# Patient Record
Sex: Male | Born: 1960 | Race: White | Hispanic: No | Marital: Single | Smoking: Never smoker
Health system: Southern US, Community
[De-identification: ages and names within clinical notes are randomized; demographics above are authoritative.]

## PROBLEM LIST (undated history)

## (undated) DIAGNOSIS — J449 Chronic obstructive pulmonary disease, unspecified: Secondary | ICD-10-CM

## (undated) DIAGNOSIS — I10 Essential (primary) hypertension: Secondary | ICD-10-CM

## (undated) DIAGNOSIS — M1990 Unspecified osteoarthritis, unspecified site: Secondary | ICD-10-CM

## (undated) DIAGNOSIS — G8929 Other chronic pain: Secondary | ICD-10-CM

## (undated) DIAGNOSIS — Z86718 Personal history of other venous thrombosis and embolism: Secondary | ICD-10-CM

## (undated) DIAGNOSIS — I4819 Other persistent atrial fibrillation: Secondary | ICD-10-CM

## (undated) DIAGNOSIS — I428 Other cardiomyopathies: Secondary | ICD-10-CM

## (undated) DIAGNOSIS — Z86711 Personal history of pulmonary embolism: Secondary | ICD-10-CM

## (undated) DIAGNOSIS — K219 Gastro-esophageal reflux disease without esophagitis: Secondary | ICD-10-CM

## (undated) DIAGNOSIS — J45909 Unspecified asthma, uncomplicated: Secondary | ICD-10-CM

## (undated) DIAGNOSIS — I5020 Unspecified systolic (congestive) heart failure: Secondary | ICD-10-CM

## (undated) DIAGNOSIS — G939 Disorder of brain, unspecified: Secondary | ICD-10-CM

## (undated) DIAGNOSIS — F419 Anxiety disorder, unspecified: Secondary | ICD-10-CM

## (undated) DIAGNOSIS — G4730 Sleep apnea, unspecified: Secondary | ICD-10-CM

## (undated) DIAGNOSIS — I219 Acute myocardial infarction, unspecified: Secondary | ICD-10-CM

## (undated) DIAGNOSIS — M542 Cervicalgia: Secondary | ICD-10-CM

## (undated) DIAGNOSIS — T7840XA Allergy, unspecified, initial encounter: Secondary | ICD-10-CM

## (undated) DIAGNOSIS — F329 Major depressive disorder, single episode, unspecified: Secondary | ICD-10-CM

## (undated) DIAGNOSIS — D689 Coagulation defect, unspecified: Secondary | ICD-10-CM

## (undated) DIAGNOSIS — F32A Depression, unspecified: Secondary | ICD-10-CM

## (undated) HISTORY — PX: HERNIA REPAIR: SHX51

## (undated) HISTORY — PX: LEG SURGERY: SHX1003

## (undated) HISTORY — DX: Anxiety disorder, unspecified: F41.9

## (undated) HISTORY — DX: Unspecified osteoarthritis, unspecified site: M19.90

## (undated) HISTORY — DX: Disorder of brain, unspecified: G93.9

## (undated) HISTORY — DX: Gastro-esophageal reflux disease without esophagitis: K21.9

## (undated) HISTORY — DX: Allergy, unspecified, initial encounter: T78.40XA

## (undated) HISTORY — DX: Coagulation defect, unspecified: D68.9

## (undated) HISTORY — PX: OTHER SURGICAL HISTORY: SHX169

---
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
## 2018-01-16 ENCOUNTER — Encounter: Payer: Self-pay | Admitting: Internal Medicine

## 2018-01-16 ENCOUNTER — Emergency Department: Payer: Medicaid - Out of State

## 2018-01-16 ENCOUNTER — Inpatient Hospital Stay
Admission: EM | Admit: 2018-01-16 | Discharge: 2018-01-19 | DRG: 291 | Disposition: A | Discharge status: Home / Self Care | Payer: Medicaid - Out of State | Attending: Internal Medicine | Admitting: Internal Medicine

## 2018-01-16 DIAGNOSIS — Z8249 Family history of ischemic heart disease and other diseases of the circulatory system: Secondary | ICD-10-CM | POA: Diagnosis not present

## 2018-01-16 DIAGNOSIS — Z6841 Body Mass Index (BMI) 40.0 and over, adult: Secondary | ICD-10-CM | POA: Diagnosis not present

## 2018-01-16 DIAGNOSIS — I4891 Unspecified atrial fibrillation: Secondary | ICD-10-CM | POA: Diagnosis not present

## 2018-01-16 DIAGNOSIS — J9600 Acute respiratory failure, unspecified whether with hypoxia or hypercapnia: Secondary | ICD-10-CM | POA: Diagnosis not present

## 2018-01-16 DIAGNOSIS — I878 Other specified disorders of veins: Secondary | ICD-10-CM | POA: Diagnosis present

## 2018-01-16 DIAGNOSIS — I248 Other forms of acute ischemic heart disease: Secondary | ICD-10-CM | POA: Diagnosis present

## 2018-01-16 DIAGNOSIS — Z79899 Other long term (current) drug therapy: Secondary | ICD-10-CM

## 2018-01-16 DIAGNOSIS — J441 Chronic obstructive pulmonary disease with (acute) exacerbation: Secondary | ICD-10-CM | POA: Diagnosis present

## 2018-01-16 DIAGNOSIS — I447 Left bundle-branch block, unspecified: Secondary | ICD-10-CM | POA: Diagnosis present

## 2018-01-16 DIAGNOSIS — J9601 Acute respiratory failure with hypoxia: Secondary | ICD-10-CM | POA: Diagnosis not present

## 2018-01-16 DIAGNOSIS — E662 Morbid (severe) obesity with alveolar hypoventilation: Secondary | ICD-10-CM | POA: Diagnosis present

## 2018-01-16 DIAGNOSIS — I5023 Acute on chronic systolic (congestive) heart failure: Secondary | ICD-10-CM | POA: Diagnosis not present

## 2018-01-16 DIAGNOSIS — I110 Hypertensive heart disease with heart failure: Secondary | ICD-10-CM | POA: Diagnosis present

## 2018-01-16 DIAGNOSIS — Z7901 Long term (current) use of anticoagulants: Secondary | ICD-10-CM

## 2018-01-16 DIAGNOSIS — I509 Heart failure, unspecified: Secondary | ICD-10-CM | POA: Diagnosis not present

## 2018-01-16 DIAGNOSIS — G4733 Obstructive sleep apnea (adult) (pediatric): Secondary | ICD-10-CM | POA: Diagnosis not present

## 2018-01-16 DIAGNOSIS — R0602 Shortness of breath: Secondary | ICD-10-CM | POA: Diagnosis not present

## 2018-01-16 DIAGNOSIS — J45901 Unspecified asthma with (acute) exacerbation: Secondary | ICD-10-CM | POA: Diagnosis not present

## 2018-01-16 HISTORY — DX: Sleep apnea, unspecified: G47.30

## 2018-01-16 HISTORY — DX: Unspecified asthma, uncomplicated: J45.909

## 2018-01-16 LAB — CBC WITH DIFFERENTIAL/PLATELET
Abs Immature Granulocytes: 0.02 10*3/uL (ref 0.00–0.07)
Basophils Absolute: 0 10*3/uL (ref 0.0–0.1)
Basophils Relative: 1 %
Eosinophils Absolute: 0 10*3/uL (ref 0.0–0.5)
Eosinophils Relative: 1 %
HCT: 36.5 % — ABNORMAL LOW (ref 39.0–52.0)
Hemoglobin: 11.1 g/dL — ABNORMAL LOW (ref 13.0–17.0)
Immature Granulocytes: 0 %
Lymphocytes Relative: 12 %
Lymphs Abs: 0.8 10*3/uL (ref 0.7–4.0)
MCH: 24.8 pg — ABNORMAL LOW (ref 26.0–34.0)
MCHC: 30.4 g/dL (ref 30.0–36.0)
MCV: 81.5 fL (ref 80.0–100.0)
MONOS PCT: 10 %
Monocytes Absolute: 0.6 10*3/uL (ref 0.1–1.0)
Neutro Abs: 4.8 10*3/uL (ref 1.7–7.7)
Neutrophils Relative %: 76 %
Platelets: 226 10*3/uL (ref 150–400)
RBC: 4.48 MIL/uL (ref 4.22–5.81)
RDW: 19.1 % — ABNORMAL HIGH (ref 11.5–15.5)
WBC: 6.3 10*3/uL (ref 4.0–10.5)
nRBC: 0 % (ref 0.0–0.2)

## 2018-01-16 LAB — COMPREHENSIVE METABOLIC PANEL
ALT: 18 U/L (ref 0–44)
AST: 30 U/L (ref 15–41)
Albumin: 3.3 g/dL — ABNORMAL LOW (ref 3.5–5.0)
Alkaline Phosphatase: 60 U/L (ref 38–126)
Anion gap: 10 (ref 5–15)
BUN: 10 mg/dL (ref 6–20)
CO2: 27 mmol/L (ref 22–32)
Calcium: 8.7 mg/dL — ABNORMAL LOW (ref 8.9–10.3)
Chloride: 101 mmol/L (ref 98–111)
Creatinine, Ser: 0.64 mg/dL (ref 0.61–1.24)
GFR calc Af Amer: >60 mL/min (ref >60)
GFR calc non Af Amer: >60 mL/min (ref >60)
Glucose, Bld: 133 mg/dL — ABNORMAL HIGH (ref 70–99)
Potassium: 3.5 mmol/L (ref 3.5–5.1)
Sodium: 138 mmol/L (ref 135–145)
Total Bilirubin: 0.9 mg/dL (ref 0.3–1.2)
Total Protein: 7.3 g/dL (ref 6.5–8.1)

## 2018-01-16 LAB — GLUCOSE, CAPILLARY: Glucose-Capillary: 144 mg/dL — ABNORMAL HIGH (ref 70–99)

## 2018-01-16 LAB — TROPONIN I
Troponin I: 0.04 ng/mL (ref <0.03)
Troponin I: 0.04 ng/mL (ref <0.03)

## 2018-01-16 LAB — CG4 I-STAT (LACTIC ACID): Lactic Acid, Venous: 1.97 mmol/L — ABNORMAL HIGH (ref 0.5–1.9)

## 2018-01-16 LAB — BRAIN NATRIURETIC PEPTIDE: B Natriuretic Peptide: 307 pg/mL — ABNORMAL HIGH (ref 0.0–100.0)

## 2018-01-16 LAB — MRSA PCR SCREENING: MRSA by PCR: NEGATIVE

## 2018-01-16 MED ORDER — ACETAMINOPHEN 325 MG PO TABS
650.0000 mg | ORAL_TABLET | Freq: Four times a day (QID) | ORAL | Status: DC | PRN
  Administered 2018-01-18 – 2018-01-19 (×2): 650 mg via ORAL
  Filled 2018-01-16 (×2): qty 2

## 2018-01-16 MED ORDER — ACETAMINOPHEN 650 MG RE SUPP: 650.0000 mg | Freq: Four times a day (QID) | RECTAL | Status: DC | PRN

## 2018-01-16 MED ORDER — IPRATROPIUM-ALBUTEROL 0.5-2.5 (3) MG/3ML IN SOLN
3.0000 mL | Freq: Once | RESPIRATORY_TRACT | Status: AC
  Administered 2018-01-16: 3 mL via RESPIRATORY_TRACT
  Filled 2018-01-16: qty 3

## 2018-01-16 MED ORDER — LEVALBUTEROL HCL 0.63 MG/3ML IN NEBU
0.6300 mg | INHALATION_SOLUTION | Freq: Four times a day (QID) | RESPIRATORY_TRACT | Status: DC
  Administered 2018-01-16 – 2018-01-18 (×5): 0.63 mg via RESPIRATORY_TRACT
  Filled 2018-01-16 (×10): qty 3

## 2018-01-16 MED ORDER — RIVAROXABAN 20 MG PO TABS
20.0000 mg | ORAL_TABLET | Freq: Every day | ORAL | Status: DC
  Administered 2018-01-16 – 2018-01-18 (×3): 20 mg via ORAL
  Filled 2018-01-16 (×5): qty 1

## 2018-01-16 MED ORDER — MORPHINE SULFATE (PF) 2 MG/ML IV SOLN
1.0000 mg | INTRAVENOUS | Status: DC | PRN
  Administered 2018-01-16 – 2018-01-18 (×3): 2 mg via INTRAVENOUS
  Filled 2018-01-16 (×3): qty 1

## 2018-01-16 MED ORDER — ASPIRIN EC 81 MG PO TBEC
81.0000 mg | DELAYED_RELEASE_TABLET | Freq: Every day | ORAL | Status: DC
  Administered 2018-01-17 – 2018-01-19 (×3): 81 mg via ORAL
  Filled 2018-01-16 (×3): qty 1

## 2018-01-16 MED ORDER — TRAZODONE HCL 50 MG PO TABS
150.0000 mg | ORAL_TABLET | Freq: Every evening | ORAL | Status: DC | PRN
  Administered 2018-01-16 – 2018-01-18 (×3): 150 mg via ORAL
  Filled 2018-01-16 (×3): qty 1

## 2018-01-16 MED ORDER — BUMETANIDE 1 MG PO TABS
2.0000 mg | ORAL_TABLET | Freq: Two times a day (BID) | ORAL | Status: DC
  Administered 2018-01-16 – 2018-01-17 (×2): 2 mg via ORAL
  Filled 2018-01-16 (×3): qty 2

## 2018-01-16 MED ORDER — ONDANSETRON HCL 4 MG/2ML IJ SOLN: 4.0000 mg | Freq: Four times a day (QID) | INTRAMUSCULAR | Status: DC | PRN

## 2018-01-16 MED ORDER — SODIUM CHLORIDE 0.9% FLUSH
3.0000 mL | Freq: Two times a day (BID) | INTRAVENOUS | Status: DC
  Administered 2018-01-16 – 2018-01-19 (×6): 3 mL via INTRAVENOUS

## 2018-01-16 MED ORDER — SODIUM CHLORIDE 0.9 % IV SOLN: 250.0000 mL | INTRAVENOUS | Status: DC | PRN

## 2018-01-16 MED ORDER — FUROSEMIDE 10 MG/ML IJ SOLN: 40.0000 mg | Freq: Two times a day (BID) | INTRAMUSCULAR | Status: DC

## 2018-01-16 MED ORDER — INFLUENZA VAC SPLIT QUAD 0.5 ML IM SUSY: 0.5000 mL | PREFILLED_SYRINGE | INTRAMUSCULAR | Status: DC

## 2018-01-16 MED ORDER — BUDESONIDE 0.25 MG/2ML IN SUSP
0.2500 mg | Freq: Two times a day (BID) | RESPIRATORY_TRACT | Status: DC
  Administered 2018-01-17 – 2018-01-19 (×5): 0.25 mg via RESPIRATORY_TRACT
  Filled 2018-01-16 (×6): qty 2

## 2018-01-16 MED ORDER — FUROSEMIDE 10 MG/ML IJ SOLN
40.0000 mg | Freq: Once | INTRAMUSCULAR | Status: AC
  Administered 2018-01-16: 40 mg via INTRAVENOUS
  Filled 2018-01-16: qty 4

## 2018-01-16 MED ORDER — PNEUMOCOCCAL VAC POLYVALENT 25 MCG/0.5ML IJ INJ: 0.5000 mL | INJECTION | INTRAMUSCULAR | Status: DC

## 2018-01-16 MED ORDER — NITROGLYCERIN 0.4 MG SL SUBL: 0.4000 mg | SUBLINGUAL_TABLET | SUBLINGUAL | Status: DC | PRN

## 2018-01-16 MED ORDER — SODIUM CHLORIDE 0.9% FLUSH
3.0000 mL | INTRAVENOUS | Status: DC | PRN
  Administered 2018-01-18: 3 mL via INTRAVENOUS
  Filled 2018-01-16: qty 3

## 2018-01-16 MED ORDER — ENOXAPARIN SODIUM 40 MG/0.4ML ~~LOC~~ SOLN: 40.0000 mg | SUBCUTANEOUS | Status: DC

## 2018-01-16 MED ORDER — METHYLPREDNISOLONE SODIUM SUCC 125 MG IJ SOLR
125.0000 mg | Freq: Once | INTRAMUSCULAR | Status: AC
  Administered 2018-01-16: 125 mg via INTRAVENOUS
  Filled 2018-01-16: qty 2

## 2018-01-16 MED ORDER — FUROSEMIDE 10 MG/ML IJ SOLN
40.0000 mg | Freq: Two times a day (BID) | INTRAMUSCULAR | Status: DC
  Administered 2018-01-17 – 2018-01-19 (×6): 40 mg via INTRAVENOUS
  Filled 2018-01-16 (×7): qty 4

## 2018-01-16 MED ORDER — LISINOPRIL 2.5 MG PO TABS
2.5000 mg | ORAL_TABLET | Freq: Every day | ORAL | Status: DC
  Administered 2018-01-16 – 2018-01-19 (×4): 2.5 mg via ORAL
  Filled 2018-01-16 (×4): qty 1

## 2018-01-16 MED ORDER — ONDANSETRON HCL 4 MG PO TABS: 4.0000 mg | ORAL_TABLET | Freq: Four times a day (QID) | ORAL | Status: DC | PRN

## 2018-01-16 NOTE — ED Notes (Signed)
I-STAT Lactic done on pt by this Tech. Result came back within normal range. Rn Monserrate notified of the result.

## 2018-01-16 NOTE — Progress Notes (Signed)
Anticoag Monitoring: Patient is 57yo male admitted for acute respiratory failure. Patient was taking Xarelto prior to admission and this has been continued on admission.  Will discontinue Lovenox 40mg  SQ q24h since patient will be fully anticoagulated on Xarelto.  Clovia Cuff, PharmD, BCPS 01/16/2018 2:43 PM

## 2018-01-16 NOTE — Progress Notes (Signed)
D/C Lovenox order when released as patient takes home Xarelto and will continue that inpatient per physician discussion.  Mauri Reading, PharmD Pharmacy Resident  01/16/2018 1:35 PM

## 2018-01-16 NOTE — ED Triage Notes (Signed)
Pt presents today via [facility] from home for SOB. Pt was found in tri pod position. Pt has hx of CHF and afib and was 70's  [facility] placed pt on bipap. Pt presented on bi pap

## 2018-01-16 NOTE — H&P (Signed)
[employer] - [place] [facility]   PATIENT NAME: [name] [name]    MR#:  [id number]  DATE OF BIRTH:  1960-07-11  DATE OF ADMISSION:  01/16/2018  PRIMARY CARE PHYSICIAN: Patient, No Pcp Per   REQUESTING/REFERRING PHYSICIAN: Governor Rooks MD  CHIEF COMPLAINT:   Chief Complaint  Patient presents with  . Shortness of Breath    HISTORY OF PRESENT ILLNESS: [name] [name]  is a 57 y.o. male with a known history of congestive heart failure, asthma, sleep apnea who states that he recently moved from [place] and not has not been able to get some of his medications.  Who is presenting with shortness of breath since this morning.  He states that it started all of a sudden.  Arrived in the ED patient had to be placed on BiPAP which she continues to be on.  Patient complains of wheezing also swelling of his lower extremity.  He does not have any cough or fevers.  Complains of occasional chest pains.  PAST MEDICAL HISTORY:   Past Medical History:  Diagnosis Date  . A-fib (HCC)   . Asthma   . CHF (congestive heart failure) (HCC)   . Sleep apnea     PAST SURGICAL HISTORY:  Past Surgical History:  Procedure Laterality Date  . hearnia repair    . LEG SURGERY      SOCIAL HISTORY:  Social History   Tobacco Use  . Smoking status: Never Smoker  . Smokeless tobacco: Never Used  Substance Use Topics  . Alcohol use: Never    Frequency: Never    FAMILY HISTORY:  Family History  Problem Relation Age of Onset  . Heart failure Mother     DRUG ALLERGIES: Not on File  REVIEW OF SYSTEMS:   CONSTITUTIONAL: No fever, fatigue or weakness.  EYES: No blurred or double vision.  EARS, NOSE, AND THROAT: No tinnitus or ear pain.  RESPIRATORY: No cough, positive shortness of breath, wheezing or hemoptysis.  CARDIOVASCULAR: No chest pain, orthopnea, positive edema.  GASTROINTESTINAL: No nausea, vomiting, diarrhea or abdominal pain.  GENITOURINARY: No dysuria, hematuria.   ENDOCRINE: No polyuria, nocturia,  HEMATOLOGY: No anemia, easy bruising or bleeding SKIN: No rash or lesion. MUSCULOSKELETAL: No joint pain or arthritis.   NEUROLOGIC: No tingling, numbness, weakness.  PSYCHIATRY: No anxiety or depression.   MEDICATIONS AT HOME:  Prior to Admission medications   Medication Sig Start Date End Date Taking? Authorizing Provider  bumetanide (BUMEX) 2 MG tablet Take 2 mg by mouth 2 (two) times daily.   Yes [provider]  lisinopril (PRINIVIL,ZESTRIL) 2.5 MG tablet Take 2.5 mg by mouth daily.   Yes [provider]  nitroGLYCERIN (NITROSTAT) 0.4 MG SL tablet Place 0.4 mg under the tongue every 5 (five) minutes as needed for chest pain.   Yes [provider]  rivaroxaban (XARELTO) 20 MG TABS tablet Take 20 mg by mouth daily.   Yes [provider]      PHYSICAL EXAMINATION:   VITAL SIGNS: Blood pressure (!) 155/118, pulse 74, temperature 97.9 F (36.6 C), temperature source Axillary, resp. rate (!) 49, height 5\' 8"  (1.727 m), weight (!) 167.8 kg, SpO2 98 %.  GENERAL:  57 y.o.-year-old patient lying in the bed with no acute distress.  EYES: Pupils equal, round, reactive to light and accommodation. No scleral icterus. Extraocular muscles intact.  HEENT: Head atraumatic, normocephalic. Oropharynx and nasopharynx clear.  NECK:  Supple, no jugular venous distention. No thyroid enlargement, no tenderness.  LUNGS: Bilateral crackles at the bases 2+ edema .  CARDIOVASCULAR: S1, S2 normal. No murmurs, rubs, or gallops.  ABDOMEN: Soft, nontender, nondistended. Bowel sounds present. No organomegaly or mass.  EXTREMITIES: 2+ pedal edema, cyanosis, or clubbing.  NEUROLOGIC: Cranial nerves II through XII are intact. Muscle strength 5/5 in all extremities. Sensation intact. Gait not checked.  PSYCHIATRIC: The patient is alert and oriented x 3.  SKIN: No obvious rash, lesion, or ulcer.   LABORATORY PANEL:   CBC Recent Labs  Lab  01/16/18 1109  WBC 6.3  HGB 11.1*  HCT 36.5*  PLT 226  MCV 81.5  MCH 24.8*  MCHC 30.4  RDW 19.1*  LYMPHSABS 0.8  MONOABS 0.6  EOSABS 0.0  BASOSABS 0.0   ------------------------------------------------------------------------------------------------------------------  Chemistries  Recent Labs  Lab 01/16/18 1109  NA 138  K 3.5  CL 101  CO2 27  GLUCOSE 133*  BUN 10  CREATININE 0.64  CALCIUM 8.7*  AST 30  ALT 18  ALKPHOS 60  BILITOT 0.9   ------------------------------------------------------------------------------------------------------------------ estimated creatinine clearance is 155.9 mL/min (by C-G formula based on SCr of 0.64 mg/dL). ------------------------------------------------------------------------------------------------------------------ No results for input(s): TSH, T4TOTAL, T3FREE, THYROIDAB in the last 72 hours.  Invalid input(s): FREET3   Coagulation profile No results for input(s): INR, PROTIME in the last 168 hours. ------------------------------------------------------------------------------------------------------------------- No results for input(s): DDIMER in the last 72 hours. -------------------------------------------------------------------------------------------------------------------  Cardiac Enzymes Recent Labs  Lab 01/16/18 1109  TROPONINI 0.04*   ------------------------------------------------------------------------------------------------------------------ Invalid input(s): POCBNP  ---------------------------------------------------------------------------------------------------------------  Urinalysis No results found for: COLORURINE, APPEARANCEUR, LABSPEC, PHURINE, GLUCOSEU, HGBUR, BILIRUBINUR, KETONESUR, PROTEINUR, UROBILINOGEN, NITRITE, LEUKOCYTESUR   RADIOLOGY: Dg Chest Port 1 View  Result Date: 01/16/2018 CLINICAL DATA:  Shortness of breath. History of congestive heart failure. EXAM: PORTABLE CHEST 1 VIEW  COMPARISON:  None. FINDINGS: Cardiomegaly. Pulmonary venous hypertension. Question early interstitial edema. No visible effusion. Bony structures unremarkable. IMPRESSION: Cardiomegaly. Possible mild congestive heart failure. Question blue bloater COPD. Electronically Signed   By: Paulina Fusi M.D.   On: 01/16/2018 11:46    EKG: Orders placed or performed during the hospital encounter of 01/16/18  . ED EKG  . ED EKG  . EKG 12-Lead  . EKG 12-Lead    IMPRESSION AND PLAN: Patient is a 57 year old with history of congestive heart failure presenting with shortness of breath  1.  Acute respiratory failure suspect due to acute CHF as well as possible asthma exasperation I will treat with IV Lasix Continue BiPAP patient will be admitted to stepdown unit We will place him on nebulizer therapy as well as Pulmicort  I have notified the intensivist of admission Will obtain echocardiogram of the heart  2.  Essential hypertension continue lisinopril  3.  History of atrial fibrillation continue Xarelto  4.  Sleep apnea CPAP once off BiPAP at nighttime  5.  Morbid obesity weight loss recommended  6.  Elevated troponin due to demand ischemia follow cardiac enzymes  All the records are reviewed and case discussed with ED provider. Management plans discussed with the patient, family and they are in agreement.  CODE STATUS: Full code    TOTAL TIME TAKING CARE OF THIS PATIENT: 55 minutes care time spent.    Auburn Bilberry M.D on 01/16/2018 at 5:29 PM  Between 7am to 6pm - Pager - [phone]  After 6pm go to [email or web address] - password [email or web address] [facility]  [employer] Office  [phone]  CC: Primary care physician; Patient, No Pcp Per

## 2018-01-16 NOTE — Progress Notes (Signed)
Pt was transported to CCU from ED while on the bipap. 

## 2018-01-16 NOTE — ED Provider Notes (Signed)
[facility] Emergency Department Provider Note ____________________________________________   I have reviewed the triage vital signs and the triage nursing note.  HISTORY  Chief Complaint Shortness of Breath   Historian Level 5 Caveat History Limited by illness EMS provides history  HPI [name] [name] is a 57 y.o. male brought in by EMS from home for severe trouble breathing.  Reported to them that he started having shortness of breath around 2 AM, when they got to him after he called 9 1 this morning he was tripoding.  Patient was apparently around 82% on room air, and came up to 99% on CPAP     No past medical history on file.  COPD, obesity, hypertension  There are no active problems to display for this patient.     Prior to Admission medications   Medication Sig Start Date End Date Taking? Authorizing Provider  bumetanide (BUMEX) 2 MG tablet Take 2 mg by mouth 2 (two) times daily.   Yes [provider]  lisinopril (PRINIVIL,ZESTRIL) 2.5 MG tablet Take 2.5 mg by mouth daily.   Yes [provider]  nitroGLYCERIN (NITROSTAT) 0.4 MG SL tablet Place 0.4 mg under the tongue every 5 (five) minutes as needed for chest pain.   Yes [provider]  rivaroxaban (XARELTO) 20 MG TABS tablet Take 20 mg by mouth daily.   Yes [provider]  Lisinopril, aspirin, Xarelto  Not on File  No family history on file.  Social History Social History   Tobacco Use  . Smoking status: Not on file  Substance Use Topics  . Alcohol use: Not on file  . Drug use: Not on file    Review of Systems  Constitutional: Negative for fever. Eyes: Negative for visual changes. ENT: Negative for sore throat. Cardiovascular: For some chest discomfort earlier, took one nitro, that part is better. Respiratory: Positive for shortness of breath. Gastrointestinal: Negative for abdominal pain, vomiting and diarrhea. Genitourinary: Negative for  dysuria. Musculoskeletal: Negative for back pain. Skin: Negative for rash. Neurological: Negative for headache.  ____________________________________________   PHYSICAL EXAM:  VITAL SIGNS: ED Triage Vitals  Enc Vitals Group     BP      Pulse      Resp      Temp      Temp src      SpO2      Weight      Height      Head Circumference      Peak Flow      Pain Score      Pain Loc      Pain Edu?      Excl. in GC?      Constitutional: Alert and oriented, critical shortness of breath.[name]  HEENT      Head: Normocephalic and atraumatic.      Eyes: Conjunctivae are normal. Pupils equal and round.       Ears:         Nose: No congestion/rhinnorhea.      Mouth/Throat: Mucous membranes are moist.      Neck: No stridor. Cardiovascular/Chest: Normal rate, regular rhythm.  No murmurs, rubs, or gallops. Respiratory: Tachypnea with suprasternal retractions.  On CPAP by EMS.  Tight breath sounds throughout, rales throughout. Gastrointestinal: Soft. No distention, no guarding, no rebound. Nontender.  Morbid obesity Genitourinary/rectal:Deferred Musculoskeletal: Nontender with normal range of motion in all extremities. No joint effusions.  No lower extremity tenderness.  Plus pitting edema bilateral lower extremities Neurologic:  Normal speech and language. No gross or focal neurologic deficits are appreciated. Skin:  Skin is warm, dry and intact. No rash noted. Psychiatric: Mood and affect are normal. Speech and behavior are normal. Patient exhibits appropriate insight and judgment.   ____________________________________________  LABS (pertinent positives/negatives) I, Governor Rooks, MD the attending physician have reviewed the labs noted below.  Labs Reviewed  BRAIN NATRIURETIC PEPTIDE - Abnormal; Notable for the following components:      Result Value   B Natriuretic Peptide 307.0 (*)    All other components within normal limits  COMPREHENSIVE METABOLIC PANEL - Abnormal; Notable  for the following components:   Glucose, Bld 133 (*)    Calcium 8.7 (*)    Albumin 3.3 (*)    All other components within normal limits  TROPONIN I - Abnormal; Notable for the following components:   Troponin I 0.04 (*)    All other components within normal limits  CBC WITH DIFFERENTIAL/PLATELET - Abnormal; Notable for the following components:   Hemoglobin 11.1 (*)    HCT 36.5 (*)    MCH 24.8 (*)    RDW 19.1 (*)    All other components within normal limits  CG4 I-STAT (LACTIC ACID) - Abnormal; Notable for the following components:   Lactic Acid, Venous 1.97 (*)    All other components within normal limits  CULTURE, BLOOD (ROUTINE X 2)  CULTURE, BLOOD (ROUTINE X 2)    ____________________________________________    EKG I, Governor Rooks, MD, the attending physician have personally viewed and interpreted all ECGs.  94 bpm.  Atrial fibrillation.  Left bundle branch block. ____________________________________________  RADIOLOGY   Chest x-ray portable:  IMPRESSION: Cardiomegaly. Possible mild congestive heart failure. Question blue bloater COPD. __________________________________________  PROCEDURES  Procedure(s) performed: None  Procedures  Critical Care performed: CRITICAL CARE Performed by: Governor Rooks   Total critical care time: 30 minutes  Critical care time was exclusive of separately billable procedures and treating other patients.  Critical care was necessary to treat or prevent imminent or life-threatening deterioration.  Critical care was time spent personally by me on the following activities: development of treatment plan with patient and/or surrogate as well as nursing, discussions with consultants, evaluation of patient's response to treatment, examination of patient, obtaining history from patient or surrogate, ordering and performing treatments and interventions, ordering and review of laboratory studies, ordering and review of radiographic studies,  pulse oximetry and re-evaluation of patient's condition.    ____________________________________________  ED COURSE / ASSESSMENT AND PLAN  Pertinent labs & imaging results that were available during my care of the patient were reviewed by me and considered in my medical decision making (see chart for details).     Patient arrived on cPAP, now saturating around 99%, and switched on to our BiPAP.  Patient clinically looks like volume overload with rales and lower extremity edema.  Unclear if he does have a history of CHF.  He certainly does have a history of COPD.  Initially felt most consistent with CHF, and initiated Lasix IV dose.  Chest x-ray without focal infiltrate.  I think pneumonia is unlikely.   BNP is only in the 300s.  Chest x-ray looks more like COPD rather than edema, I did go ahead and base patient on DuoNeb's as well as Solu-Medrol.  Patient will need hospital management for COPD plus or minus CHF with hypoxia, currently still on BiPAP.    CONSULTATIONS: Hospitalist for admission.  Patient / Family / Caregiver informed of clinical  course, medical decision-making process, and agree with plan.    ___________________________________________   FINAL CLINICAL IMPRESSION(S) / ED DIAGNOSES   Final diagnoses:  COPD exacerbation (HCC)  Acute congestive heart failure, unspecified heart failure type (HCC)      ___________________________________________         Note: This dictation was prepared with Dragon dictation. Any transcriptional errors that result from this process are unintentional    Governor Rooks, MD 01/16/18 1309

## 2018-01-16 NOTE — ED Notes (Signed)
ED TO INPATIENT HANDOFF REPORT  Name/Age/Gender [name] [name] 57 y.o. male  Code Status   Home/SNF/Other Home  Chief Complaint diffculty breathing  Level of Care/Admitting Diagnosis ED Disposition    ED Disposition Condition Comment   Admit  Hospital Area: [facility] [[id number]]  Level of Care: Stepdown [14]  Diagnosis: Acute respiratory failure (HCC) [518.81.ICD-9-CM]  Admitting Physician: Auburn Bilberry [[id number]]  Attending Physician: Auburn Bilberry [[id number]]  Estimated length of stay: past midnight tomorrow  Certification:: I certify this patient will need inpatient services for at least 2 midnights  PT Class (Do Not Modify): Inpatient [101]  PT Acc Code (Do Not Modify): Private [1]       Medical History No past medical history on file.  Allergies Not on File  IV Location/Drains/Wounds Patient Lines/Drains/Airways Status   Active Line/Drains/Airways    Name:   Placement date:   Placement time:   Site:   Days:   Peripheral IV 01/16/18 Right Antecubital   01/16/18    1106    Antecubital   less than 1   Peripheral IV 01/16/18 Right Hand   01/16/18    1113    Hand   less than 1          Labs/Imaging Results for orders placed or performed during the hospital encounter of 01/16/18 (from the past 48 hour(s))  Brain natriuretic peptide     Status: Abnormal   Collection Time: 01/16/18 11:09 AM  Result Value Ref Range   B Natriuretic Peptide 307.0 (H) 0.0 - 100.0 pg/mL    Comment: Performed at [facility], [address] [place], [place] [address]  Comprehensive metabolic panel     Status: Abnormal   Collection Time: 01/16/18 11:09 AM  Result Value Ref Range   Sodium 138 135 - 145 mmol/L   Potassium 3.5 3.5 - 5.1 mmol/L   Chloride 101 98 - 111 mmol/L   CO2 27 22 - 32 mmol/L   Glucose, Bld 133 (H) 70 - 99 mg/dL   BUN 10 6 - 20 mg/dL   Creatinine, Ser 1.16 0.61 - 1.24 mg/dL   Calcium 8.7 (L) 8.9 - 10.3 mg/dL   Total Protein  7.3 6.5 - 8.1 g/dL   Albumin 3.3 (L) 3.5 - 5.0 g/dL   AST 30 15 - 41 U/L   ALT 18 0 - 44 U/L   Alkaline Phosphatase 60 38 - 126 U/L   Total Bilirubin 0.9 0.3 - 1.2 mg/dL   GFR calc non Af Amer >60 >60 mL/min   GFR calc Af Amer >60 >60 mL/min   Anion gap 10 5 - 15    Comment: Performed at [facility], [address] [place], [place] [address]  Troponin I - Once     Status: Abnormal   Collection Time: 01/16/18 11:09 AM  Result Value Ref Range   Troponin I 0.04 (HH) <0.03 ng/mL    Comment: CRITICAL RESULT CALLED TO, READ BACK BY AND VERIFIED WITH GREG MOYER @1225  01/16/18 BY FMW Performed at [facility], [address] [place], [place] [address]   CBC with Differential     Status: Abnormal   Collection Time: 01/16/18 11:09 AM  Result Value Ref Range   WBC 6.3 4.0 - 10.5 K/uL   RBC 4.48 4.22 - 5.81 MIL/uL   Hemoglobin 11.1 (L) 13.0 - 17.0 g/dL   HCT 32.9 (L) 19.1 - 66.0 %   MCV 81.5 80.0 - 100.0 fL   MCH  24.8 (L) 26.0 - 34.0 pg   MCHC 30.4 30.0 - 36.0 g/dL   RDW 16.1 (H) 09.6 - 04.5 %   Platelets 226 150 - 400 K/uL   nRBC 0.0 0.0 - 0.2 %   Neutrophils Relative % 76 %   Neutro Abs 4.8 1.7 - 7.7 K/uL   Lymphocytes Relative 12 %   Lymphs Abs 0.8 0.7 - 4.0 K/uL   Monocytes Relative 10 %   Monocytes Absolute 0.6 0.1 - 1.0 K/uL   Eosinophils Relative 1 %   Eosinophils Absolute 0.0 0.0 - 0.5 K/uL   Basophils Relative 1 %   Basophils Absolute 0.0 0.0 - 0.1 K/uL   Immature Granulocytes 0 %   Abs Immature Granulocytes 0.02 0.00 - 0.07 K/uL    Comment: Performed at [facility], [address] [place], [place] [address]  CG4 I-STAT (Lactic acid)     Status: Abnormal   Collection Time: 01/16/18 11:11 AM  Result Value Ref Range   Lactic Acid, Venous 1.97 (H) 0.5 - 1.9 mmol/L   Dg Chest Port 1 View  Result Date: 01/16/2018 CLINICAL DATA:  Shortness of breath. History of congestive heart failure. EXAM: PORTABLE CHEST 1 VIEW COMPARISON:  None.  FINDINGS: Cardiomegaly. Pulmonary venous hypertension. Question early interstitial edema. No visible effusion. Bony structures unremarkable. IMPRESSION: Cardiomegaly. Possible mild congestive heart failure. Question blue bloater COPD. Electronically Signed   By: Paulina Fusi M.D.   On: 01/16/2018 11:46    Pending Labs Unresulted Labs (From admission, onward)    Start     Ordered   01/16/18 1123  Culture, blood (routine x 2)  BLOOD CULTURE X 2,   STAT     01/16/18 1122   Signed and Held  HIV antibody (Routine Testing)  Once,   R     Signed and Held   Signed and Held  CBC  (enoxaparin (LOVENOX)    CrCl >/= 30 ml/min)  Once,   R    Comments:  Baseline for enoxaparin therapy IF NOT ALREADY DRAWN.  Notify MD if PLT < 100 K.    Signed and Held   Signed and Held  Creatinine, serum  (enoxaparin (LOVENOX)    CrCl >/= 30 ml/min)  Once,   R    Comments:  Baseline for enoxaparin therapy IF NOT ALREADY DRAWN.    Signed and Held   Signed and Held  Creatinine, serum  (enoxaparin (LOVENOX)    CrCl >/= 30 ml/min)  Weekly,   R    Comments:  while on enoxaparin therapy    Signed and Held   Signed and Held  Troponin I - Now Then Q6H  Now then every 6 hours,   R     Signed and Held   Signed and Held  CBC  Tomorrow morning,   R     Signed and Held   Signed and Held  Basic metabolic panel  Tomorrow morning,   R     Signed and Held          Vitals/Pain Today's Vitals   01/16/18 1200 01/16/18 1232 01/16/18 1245 01/16/18 1400  BP: (Abnormal) 117/93 99/89  (Abnormal) 132/99  Pulse: (Abnormal) 106 (Abnormal) 108 94 (Abnormal) 103  Resp: (Abnormal) 23     Temp:      TempSrc:      SpO2: 99% 100% 97% 100%  Weight:      Height:      PainSc:  Isolation Precautions No active isolations  Medications Medications  budesonide (PULMICORT) nebulizer solution 0.25 mg (has no administration in time range)  furosemide (LASIX) injection 40 mg (has no administration in time range)  furosemide (LASIX)  injection 40 mg (40 mg Intravenous Given 01/16/18 1132)  ipratropium-albuterol (DUONEB) 0.5-2.5 (3) MG/3ML nebulizer solution 3 mL (3 mLs Nebulization Given 01/16/18 1313)  ipratropium-albuterol (DUONEB) 0.5-2.5 (3) MG/3ML nebulizer solution 3 mL (3 mLs Nebulization Given 01/16/18 1313)  methylPREDNISolone sodium succinate (SOLU-MEDROL) 125 mg/2 mL injection 125 mg (125 mg Intravenous Given 01/16/18 1301)    Mobility walks

## 2018-01-16 NOTE — Consult Note (Signed)
Reason for Consult: Respiratory failure Referring Physician: Dr. Reyes Ivan [name] is an 57 y.o. male.  HPI: [name] [name] is a 57 year old gentleman with a past medical history remarkable for congestive heart failure, atrial fibrillation on systemic anticoagulation, hypertension, COPD, was brought in from home after he woke up at 2 AM with acute onset of shortness of breath.  When EMS arrived he was found to be hypoxemic with oxygen saturations in the 70s, was started on positive pressure and transported to the emergency department.  He was switched to BiPAP, given Lasix, albuterol, Atrovent, budesonide and Solu-Medrol.  Chest x-ray revealed cardiomegaly with vascular congestion  No past medical history on file.   No family history on file.  Social History:  has no tobacco, alcohol, and drug history on file.  Allergies: Not on File  Medications: I have reviewed the patient's current medications.  Results for orders placed or performed during the hospital encounter of 01/16/18 (from the past 48 hour(s))  Brain natriuretic peptide     Status: Abnormal   Collection Time: 01/16/18 11:09 AM  Result Value Ref Range   B Natriuretic Peptide 307.0 (H) 0.0 - 100.0 pg/mL    Comment: Performed at [facility], [address] [place], [place] [address]  Comprehensive metabolic panel     Status: Abnormal   Collection Time: 01/16/18 11:09 AM  Result Value Ref Range   Sodium 138 135 - 145 mmol/L   Potassium 3.5 3.5 - 5.1 mmol/L   Chloride 101 98 - 111 mmol/L   CO2 27 22 - 32 mmol/L   Glucose, Bld 133 (H) 70 - 99 mg/dL   BUN 10 6 - 20 mg/dL   Creatinine, Ser 6.04 0.61 - 1.24 mg/dL   Calcium 8.7 (L) 8.9 - 10.3 mg/dL   Total Protein 7.3 6.5 - 8.1 g/dL   Albumin 3.3 (L) 3.5 - 5.0 g/dL   AST 30 15 - 41 U/L   ALT 18 0 - 44 U/L   Alkaline Phosphatase 60 38 - 126 U/L   Total Bilirubin 0.9 0.3 - 1.2 mg/dL   GFR calc non Af Amer >60 >60 mL/min   GFR calc Af Amer >60 >60 mL/min   Anion  gap 10 5 - 15    Comment: Performed at [facility], [address] [place], [place] [address]  Troponin I - Once     Status: Abnormal   Collection Time: 01/16/18 11:09 AM  Result Value Ref Range   Troponin I 0.04 (HH) <0.03 ng/mL    Comment: CRITICAL RESULT CALLED TO, READ BACK BY AND VERIFIED WITH GREG MOYER @1225  01/16/18 BY FMW Performed at [facility], [address] [place], [place] [address]   CBC with Differential     Status: Abnormal   Collection Time: 01/16/18 11:09 AM  Result Value Ref Range   WBC 6.3 4.0 - 10.5 K/uL   RBC 4.48 4.22 - 5.81 MIL/uL   Hemoglobin 11.1 (L) 13.0 - 17.0 g/dL   HCT 78.2 (L) 95.6 - 21.3 %   MCV 81.5 80.0 - 100.0 fL   MCH 24.8 (L) 26.0 - 34.0 pg   MCHC 30.4 30.0 - 36.0 g/dL   RDW 08.6 (H) 57.8 - 46.9 %   Platelets 226 150 - 400 K/uL   nRBC 0.0 0.0 - 0.2 %   Neutrophils Relative % 76 %   Neutro Abs 4.8 1.7 - 7.7 K/uL   Lymphocytes Relative 12 %   Lymphs Abs 0.8  0.7 - 4.0 K/uL   Monocytes Relative 10 %   Monocytes Absolute 0.6 0.1 - 1.0 K/uL   Eosinophils Relative 1 %   Eosinophils Absolute 0.0 0.0 - 0.5 K/uL   Basophils Relative 1 %   Basophils Absolute 0.0 0.0 - 0.1 K/uL   Immature Granulocytes 0 %   Abs Immature Granulocytes 0.02 0.00 - 0.07 K/uL    Comment: Performed at [facility], [address] [place], [place] [address]  CG4 I-STAT (Lactic acid)     Status: Abnormal   Collection Time: 01/16/18 11:11 AM  Result Value Ref Range   Lactic Acid, Venous 1.97 (H) 0.5 - 1.9 mmol/L    Dg Chest Port 1 View  Result Date: 01/16/2018 CLINICAL DATA:  Shortness of breath. History of congestive heart failure. EXAM: PORTABLE CHEST 1 VIEW COMPARISON:  None. FINDINGS: Cardiomegaly. Pulmonary venous hypertension. Question early interstitial edema. No visible effusion. Bony structures unremarkable. IMPRESSION: Cardiomegaly. Possible mild congestive heart failure. Question blue bloater COPD. Electronically Signed   By:  Paulina Fusi M.D.   On: 01/16/2018 11:46    ROS   Constitutional: Negative for fever. Eyes: Negative for visual changes. ENT: Negative for sore throat. Cardiovascular: For some chest discomfort earlier, took one nitro, that part is better. Respiratory: Positive for shortness of breath. Gastrointestinal: Negative for abdominal pain, vomiting and diarrhea. Genitourinary: Negative for dysuria. Musculoskeletal: Negative for back pain. Skin: Negative for rash. Neurological: Negative for headache.  Blood pressure (!) 132/99, pulse (!) 118, temperature 98.7 F (37.1 C), temperature source Oral, resp. rate (!) 24, height 5\' 7"  (1.702 m), weight (!) 167.8 kg, SpO2 100 %.   Physical Exam   Vital signs: Please see the above listed vital signs HEENT: Trachea midline, no thyromegaly appreciated, positive accessory muscle utilization, on BiPAP Cardiovascular: Irregularly irregular rhythm with increased ventricular response Pulmonary: Rales appreciated bilaterally Abdominal: Morbid obesity, positive bowel sounds Extremities: Massive edema appreciated with chronic venous stasis Neurologic: Patient moves all extremities, no focal deficits appreciated, awake alert communicating and appropriate Cutaneous: No rashes or lesions noted  Assessment/Plan: Respiratory distress.  Patient with underlying history of CHF with chest x-ray revealing cardiomegaly with vascular congestion.  Elevated BNP, most likely reflects decompensated pulmonary edema.  Responding well to BiPAP.  History of COPD.  Agree with albuterol, Atrovent, budesonide and Solu-Medrol.  No clinical evidence of infection right now we will hold on antibiotics  Obstructively sleep apnea/obesity hypoventilation syndrome.  Patient is on home CPAP with no oxygen.  Morbid obesity suspect component of hypoventilation syndrome.  Will need to confirm with arterial blood gas  Atrial fibrillation.  Will monitor ventricular response, may need  Cardizem  Hypertension.  Table at this time will monitor closely  Minimal elevation in troponin at 0.04.  EKG reveals atrial fibrillation with left bundle branch block   [name] Kindred, [name] [name]  [name] [name] 01/16/2018, 2:24 PM

## 2018-01-17 ENCOUNTER — Inpatient Hospital Stay
Admit: 2018-01-17 | Discharge: 2018-01-17 | Disposition: A | Discharge status: Home / Self Care | Payer: Medicaid - Out of State | Attending: Internal Medicine | Admitting: Internal Medicine

## 2018-01-17 ENCOUNTER — Other Ambulatory Visit: Payer: Self-pay

## 2018-01-17 DIAGNOSIS — J441 Chronic obstructive pulmonary disease with (acute) exacerbation: Secondary | ICD-10-CM

## 2018-01-17 DIAGNOSIS — I5031 Acute diastolic (congestive) heart failure: Secondary | ICD-10-CM

## 2018-01-17 LAB — TROPONIN I
Troponin I: 0.03 ng/mL (ref <0.03)
Troponin I: 0.03 ng/mL (ref <0.03)

## 2018-01-17 LAB — BASIC METABOLIC PANEL
Anion gap: 8 (ref 5–15)
BUN: 13 mg/dL (ref 6–20)
CO2: 30 mmol/L (ref 22–32)
Calcium: 8.6 mg/dL — ABNORMAL LOW (ref 8.9–10.3)
Chloride: 99 mmol/L (ref 98–111)
Creatinine, Ser: 0.7 mg/dL (ref 0.61–1.24)
GFR calc Af Amer: >60 mL/min (ref >60)
GFR calc non Af Amer: >60 mL/min (ref >60)
Glucose, Bld: 164 mg/dL — ABNORMAL HIGH (ref 70–99)
POTASSIUM: 3.8 mmol/L (ref 3.5–5.1)
Sodium: 137 mmol/L (ref 135–145)

## 2018-01-17 LAB — CBC
HCT: 33.8 % — ABNORMAL LOW (ref 39.0–52.0)
Hemoglobin: 10.3 g/dL — ABNORMAL LOW (ref 13.0–17.0)
MCH: 24.7 pg — ABNORMAL LOW (ref 26.0–34.0)
MCHC: 30.5 g/dL (ref 30.0–36.0)
MCV: 81.1 fL (ref 80.0–100.0)
Platelets: 201 10*3/uL (ref 150–400)
RBC: 4.17 MIL/uL — ABNORMAL LOW (ref 4.22–5.81)
RDW: 18.9 % — ABNORMAL HIGH (ref 11.5–15.5)
WBC: 4.1 10*3/uL (ref 4.0–10.5)
nRBC: 0 % (ref 0.0–0.2)

## 2018-01-17 LAB — ECHOCARDIOGRAM COMPLETE
Height: 68 in
Weight: 5918.91 oz

## 2018-01-17 MED ORDER — DIGOXIN 0.25 MG/ML IJ SOLN
0.2500 mg | Freq: Every day | INTRAMUSCULAR | Status: DC
  Administered 2018-01-17 – 2018-01-19 (×3): 0.25 mg via INTRAVENOUS
  Filled 2018-01-17 (×3): qty 2

## 2018-01-17 MED ORDER — PERFLUTREN LIPID MICROSPHERE
1.0000 mL | INTRAVENOUS | Status: AC | PRN
  Administered 2018-01-17: 2 mL via INTRAVENOUS
  Filled 2018-01-17: qty 10

## 2018-01-17 MED ORDER — METOPROLOL TARTRATE 5 MG/5ML IV SOLN
5.0000 mg | Freq: Four times a day (QID) | INTRAVENOUS | Status: DC
  Administered 2018-01-17 – 2018-01-18 (×3): 5 mg via INTRAVENOUS
  Filled 2018-01-17 (×3): qty 5

## 2018-01-17 NOTE — Progress Notes (Signed)
Follow up - Critical Care Medicine Note  Patient Details:    [name] [name] is an 57 y.o. male.with a past medical history remarkable for congestive heart failure, atrial fibrillation on systemic anticoagulation, hypertension, COPD, was brought in from home after he woke up at 2 AM with acute onset of shortness of breath.  When EMS arrived he was found to be hypoxemic with oxygen saturations in the 70s, was started on positive pressure and transported to the emergency department.  He was switched to BiPAP, given Lasix, albuterol, Atrovent, budesonide and Solu-Medrol.  Chest x-ray revealed cardiomegaly with vascular congestion  Lines, Airways, Drains:    Anti-infectives:  Anti-infectives (From admission, onward)   None      Microbiology: Results for orders placed or performed during the hospital encounter of 01/16/18  Culture, blood (routine x 2)     Status: None (Preliminary result)   Collection Time: 01/16/18 11:09 AM  Result Value Ref Range Status   Specimen Description BLOOD RIGHT ANTECUBITAL  Final   Special Requests   Final    BOTTLES DRAWN AEROBIC AND ANAEROBIC Blood Culture adequate volume   Culture   Final    NO GROWTH < 24 HOURS Performed at [facility], [address], [place], [place] [address]    Report Status PENDING  Incomplete  Culture, blood (routine x 2)     Status: None (Preliminary result)   Collection Time: 01/16/18 11:09 AM  Result Value Ref Range Status   Specimen Description BLOOD RIGHT HAND  Final   Special Requests   Final    BOTTLES DRAWN AEROBIC AND ANAEROBIC Blood Culture adequate volume   Culture   Final    NO GROWTH < 24 HOURS Performed at [facility], [address], [place], [place] [address]    Report Status PENDING  Incomplete  MRSA PCR Screening     Status: None   Collection Time: 01/16/18  2:43 PM  Result Value Ref Range Status   MRSA by PCR NEGATIVE NEGATIVE Final    Comment:        The GeneXpert MRSA Assay  (FDA approved for NASAL specimens only), is one component of a comprehensive MRSA colonization surveillance program. It is not intended to diagnose MRSA infection nor to guide or monitor treatment for MRSA infections. Performed at [facility], [address] [place], [place] [address]      Studies: Dg Chest [facility] 1 View  Result Date: 01/16/2018 CLINICAL DATA:  Shortness of breath. History of congestive heart failure. EXAM: PORTABLE CHEST 1 VIEW COMPARISON:  None. FINDINGS: Cardiomegaly. Pulmonary venous hypertension. Question early interstitial edema. No visible effusion. Bony structures unremarkable. IMPRESSION: Cardiomegaly. Possible mild congestive heart failure. Question blue bloater COPD. Electronically Signed   By: Paulina Fusi M.D.   On: 01/16/2018 11:46    Consults: Treatment Team:  Pccm, Raymond Gurney, MD   Subjective:    Overnight Issues: She states he is breathing somewhat better, has had times off of BiPAP, less labored.  Still complaining of shortness of breath and chest discomfort  Objective:  Vital signs for last 24 hours: Temp:  [97.9 F (36.6 C)-98.7 F (37.1 C)] 98.2 F (36.8 C) (12/03 0100) Pulse Rate:  [46-118] 88 (12/03 0406) Resp:  [13-49] 15 (12/03 0406) BP: (99-162)/(64-118) 140/64 (12/03 0406) SpO2:  [90 %-100 %] 94 % (12/03 0818) FiO2 (%):  [36 %] 36 % (12/02 1946) Weight:  [167.8 kg] 167.8 kg (12/02 1440)  Intake/Output from previous day: 12/02 0701 -  12/03 0700 In: 240 [P.O.:240] Out: 2700 [Urine:2700]  Intake/Output this shift: No intake/output data recorded.  Vent settings for last 24 hours: FiO2 (%):  [36 %] 36 %  Physical Exam:   Vital signs:       Please see the above listed vital signs HEENT:           Trachea midline, no thyromegaly appreciated, positive accessory muscle utilization, on BiPAP Cardiovascular:           Irregularly irregular rhythm with increased ventricular response Pulmonary:      Rales appreciated  bilaterally Abdominal:      Morbid obesity, positive bowel sounds Extremities:     Massive edema appreciated with chronic venous stasis Neurologic:      Patient moves all extremities, no focal deficits appreciated, awake alert communicating and appropriate Cutaneous:      No rashes or lesions noted  Assessment/Plan:   Respiratory distress.  Patient with underlying history of CHF with chest x-ray revealing cardiomegaly with vascular congestion.  Elevated BNP, most likely reflects decompensated pulmonary edema. Responding well to BiPAP.  Patient -2.46 L diuresis  History of COPD.  Agree with albuterol, Atrovent, budesonide and Solu-Medrol.  No clinical evidence of infection right now we will hold on antibiotics  Obstructively sleep apnea/obesity hypoventilation syndrome.  Patient is on home CPAP with no oxygen.  Morbid obesity suspect component of hypoventilation syndrome.  Will need to confirm with arterial blood gas  Atrial fibrillation.    Presently controlled ventricular response  Hypertension.    She is on Lasix and lisinopril, will monitor closely may need amlodipine  Minimal elevation in troponin at 0.04.  EKG reveals atrial fibrillation with left bundle branch block  Tora Kindred, DO  Ramla Hase 01/17/2018  *Care during the described time interval was provided by me and/or other providers on the critical care team.  I have reviewed this patient's available data, including medical history, events of note, physical examination and test results as part of my evaluation.

## 2018-01-17 NOTE — Clinical Social Work Note (Signed)
CSW consulted for primary care physician and [place] medicaid. CSW has placed a Care Management consult for the RN CM. York Spaniel MSW,LcSW [phone]

## 2018-01-17 NOTE — Progress Notes (Signed)
At this time, pt HR was 104-105 so metoprolol was administered. Will re-assess pt if digoxin is still needed.

## 2018-01-17 NOTE — Consult Note (Signed)
WOC Nurse wound consult note Reason for Consult: LE wound [place]) Patient recently moved to [place] from [place]. Reports he was followed by "urgent care" and had zinc wraps on for 2 weeks until "they fell off".  Wound type: no open wound noted, however nurse does report small open area on the posterior left calf. I am unable to visualize this due to the patient in a chair and unable to lift leg high enough to see anything open  Pressure Injury POA: NA Drainage (amount, consistency, odor) serous drainage noted on my gloved hand. Periwound: 3+ pitting edema bilaterally with hemosiderin staining bilaterally. Distal pulses are weak but due to the edema not surprised Patient is alert and oriented, I would prefer to have an ABI prior to the placement of any type of compression.  ABIs are not completed in the [facility]. Therefore I will attempt to review any other records to determine safety.    He will most definitely need follow up in a wound care center of his choice for weekly Unna's boot changes. I have explained that he can not let them just fall off.  He will most likely not be homebound and [facility] will not come for Unna's boots without the presence of open wounds.   Dressing procedure/placement/frequency: Silicone foam to the weeping areas until WOC can further review patient's records. May consider Unna's boots once the I can verify adequate follow up planned.  I will reassess this patient in the am 01/18/18 [facility] MSN,RN,CWOCN, CNS, CWON-AP [phone]

## 2018-01-17 NOTE — Progress Notes (Signed)
Pt walked to bed from chair without issue at beginning of the night without issue. PT complaining of lower back and chest pain, non-radiating. Also requesting trazodone for sleep , stating he takes at home.  NP aware  New orders placed. Pt given morphine for pain, and placed on BiPAP for the night. Social Work consult placed as pt recently moved to [place] from [place], and  Stated he needs help being set up with a primary care provider and cardiologist, and is needing help with "transfer of medicaid".

## 2018-01-17 NOTE — Progress Notes (Signed)
*  PRELIMINARY RESULTS* Echocardiogram 2D Echocardiogram has been performed. Definity image enhancer was administered.  [name] [name] 01/17/2018, 2:12 PM

## 2018-01-17 NOTE — Progress Notes (Signed)
MD on call was notified of AFIB with the highest rate of 157.

## 2018-01-17 NOTE — Progress Notes (Signed)
Advance care planning  Purpose of Encounter CHF, Code status  Parties in Attendance Patient  Patients Decisional capacity Alert and oriented , able to make medical decisions  Patient has no documented healthcare power of attorney.  He does not have any family or friends that can make that decision according to him.  We discussed regarding having documentation in place for healthcare power of attorney and advanced directives.  Discussed regarding his congestive heart failure treatment plan, prognosis.  Follow-up.  CODE STATUS discussed and patient wants to be partial code with no intubation or ventilatory support.  Okay for CPR and defibrillation.  CODE STATUS change.  Time spent - 18 minutes

## 2018-01-17 NOTE — Progress Notes (Signed)
[employer] - [place] at [facility]   PATIENT NAME: [name] [name]    MR#:  [id number]  DATE OF BIRTH:  03-06-1960  SUBJECTIVE:  CHIEF COMPLAINT:   Chief Complaint  Patient presents with  . Shortness of Breath   Of BiPAP today.  Shortness of breath is improving.  Increased urine output. Moved from [place] recently.  Has no local physicians.  REVIEW OF SYSTEMS:    Review of Systems  Constitutional: Positive for malaise/fatigue. Negative for chills and fever.  HENT: Negative for sore throat.   Eyes: Negative for blurred vision, double vision and pain.  Respiratory: Positive for cough and shortness of breath. Negative for hemoptysis and wheezing.   Cardiovascular: Negative for chest pain, palpitations, orthopnea and leg swelling.  Gastrointestinal: Negative for abdominal pain, constipation, diarrhea, heartburn, nausea and vomiting.  Genitourinary: Negative for dysuria and hematuria.  Musculoskeletal: Negative for back pain and joint pain.  Skin: Negative for rash.  Neurological: Negative for sensory change, speech change, focal weakness and headaches.  Endo/Heme/Allergies: Does not bruise/bleed easily.  Psychiatric/Behavioral: Negative for depression. The patient is not nervous/anxious.     DRUG ALLERGIES:  Not on File  VITALS:  Blood pressure 94/70, pulse 89, temperature 99 F (37.2 C), temperature source Oral, resp. rate 19, height 5\' 8"  (1.727 m), weight (!) 167.8 kg, SpO2 100 %.  PHYSICAL EXAMINATION:   Physical Exam  GENERAL:  57 y.o.-year-old patient lying in the bed with no acute distress.  Morbidly obese EYES: Pupils equal, round, reactive to light and accommodation. No scleral icterus. Extraocular muscles intact.  HEENT: Head atraumatic, normocephalic. Oropharynx and nasopharynx clear.  NECK:  Supple, no jugular venous distention. No thyroid enlargement, no tenderness.  LUNGS: Normal breath sounds bilaterally, no wheezing, rales, rhonchi. No use  of accessory muscles of respiration.  CARDIOVASCULAR: S1, S2 normal. No murmurs, rubs, or gallops.  ABDOMEN: Soft, nontender, nondistended. Bowel sounds present. No organomegaly or mass.  EXTREMITIES: No cyanosis, clubbing.  Bilateral lower extremity edema NEUROLOGIC: Cranial nerves II through XII are intact. No focal Motor or sensory deficits b/l.   PSYCHIATRIC: The patient is alert and oriented x 3.  SKIN: No obvious rash, lesion, or ulcer.   LABORATORY PANEL:   CBC Recent Labs  Lab 01/17/18 0541  WBC 4.1  HGB 10.3*  HCT 33.8*  PLT 201   ------------------------------------------------------------------------------------------------------------------ Chemistries  Recent Labs  Lab 01/16/18 1109 01/17/18 0541  NA 138 137  K 3.5 3.8  CL 101 99  CO2 27 30  GLUCOSE 133* 164*  BUN 10 13  CREATININE 0.64 0.70  CALCIUM 8.7* 8.6*  AST 30  --   ALT 18  --   ALKPHOS 60  --   BILITOT 0.9  --    ------------------------------------------------------------------------------------------------------------------  Cardiac Enzymes Recent Labs  Lab 01/17/18 0541  TROPONINI 0.03*   ------------------------------------------------------------------------------------------------------------------  RADIOLOGY:  Dg Chest Port 1 View  Result Date: 01/16/2018 CLINICAL DATA:  Shortness of breath. History of congestive heart failure. EXAM: PORTABLE CHEST 1 VIEW COMPARISON:  None. FINDINGS: Cardiomegaly. Pulmonary venous hypertension. Question early interstitial edema. No visible effusion. Bony structures unremarkable. IMPRESSION: Cardiomegaly. Possible mild congestive heart failure. Question blue bloater COPD. Electronically Signed   By: Paulina Fusi M.D.   On: 01/16/2018 11:46     ASSESSMENT AND PLAN:   Patient is a 57 year old with history of congestive heart failure presenting with shortness of breath  *Acute hypoxic respiratory failure secondary to acute on chronic congestive  heart failure.  Check echocardiogram to assess LV function. Continue diuresis with IV Lasix. On lisinopril Monitor input and output.  Repeat BMP in the morning  *  Essential hypertension  continue lisinopril  * History of atrial fibrillation continue Xarelto  *  Sleep apnea CPAP once off BiPAP at nighttime  * Morbid obesity weight loss recommended  * Elevated troponin due to demand ischemia Stable on repeat troponin check  All the records are reviewed and case discussed with Care Management/Social Worker Management plans discussed with the patient, family and they are in agreement.  CODE STATUS: Partial code.  DNI  DVT Prophylaxis: SCDs  TOTAL TIME TAKING CARE OF THIS PATIENT: 30 minutes.   POSSIBLE D/C IN 1-2 DAYS, DEPENDING ON CLINICAL CONDITION.  Molinda Bailiff Emmett Arntz M.D on 01/17/2018 at 1:30 PM  Between 7am to 6pm - Pager - [phone]  After 6pm go to [email or web address] - password [email or web address]  [facility] [place] [facility]  Office  [phone]  CC: Primary care physician; Patient, No Pcp Per  Note: This dictation was prepared with Dragon dictation along with smaller phrase technology. Any transcriptional errors that result from this process are unintentional.

## 2018-01-18 LAB — BASIC METABOLIC PANEL
Anion gap: 8 (ref 5–15)
BUN: 23 mg/dL — ABNORMAL HIGH (ref 6–20)
CO2: 31 mmol/L (ref 22–32)
Calcium: 8.5 mg/dL — ABNORMAL LOW (ref 8.9–10.3)
Chloride: 99 mmol/L (ref 98–111)
Creatinine, Ser: 0.83 mg/dL (ref 0.61–1.24)
GFR calc Af Amer: >60 mL/min (ref >60)
GFR calc non Af Amer: >60 mL/min (ref >60)
Glucose, Bld: 110 mg/dL — ABNORMAL HIGH (ref 70–99)
Potassium: 3.3 mmol/L — ABNORMAL LOW (ref 3.5–5.1)
Sodium: 138 mmol/L (ref 135–145)

## 2018-01-18 LAB — HIV ANTIBODY (ROUTINE TESTING W REFLEX): HIV Screen 4th Generation wRfx: NONREACTIVE

## 2018-01-18 MED ORDER — NITROGLYCERIN 0.4 MG SL SUBL: 0.4000 mg | SUBLINGUAL_TABLET | SUBLINGUAL | 12 refills | Status: DC | PRN

## 2018-01-18 MED ORDER — METOPROLOL TARTRATE 50 MG PO TABS
50.0000 mg | ORAL_TABLET | Freq: Two times a day (BID) | ORAL | Status: DC
  Administered 2018-01-18 – 2018-01-19 (×2): 50 mg via ORAL
  Filled 2018-01-18 (×3): qty 1

## 2018-01-18 MED ORDER — POTASSIUM CHLORIDE CRYS ER 20 MEQ PO TBCR
40.0000 meq | EXTENDED_RELEASE_TABLET | Freq: Once | ORAL | Status: AC
  Administered 2018-01-18: 40 meq via ORAL
  Filled 2018-01-18: qty 2

## 2018-01-18 MED ORDER — METOPROLOL TARTRATE 50 MG PO TABS: 50.0000 mg | ORAL_TABLET | Freq: Two times a day (BID) | ORAL | 2 refills | Status: DC

## 2018-01-18 MED ORDER — FUROSEMIDE 40 MG PO TABS: 40.0000 mg | ORAL_TABLET | Freq: Two times a day (BID) | ORAL | 11 refills | Status: DC

## 2018-01-18 MED ORDER — LISINOPRIL 2.5 MG PO TABS: 2.5000 mg | ORAL_TABLET | Freq: Every day | ORAL | 2 refills | Status: DC

## 2018-01-18 MED ORDER — RIVAROXABAN 20 MG PO TABS: 20.0000 mg | ORAL_TABLET | Freq: Every day | ORAL | 2 refills | Status: DC

## 2018-01-18 MED ORDER — LEVALBUTEROL HCL 0.63 MG/3ML IN NEBU
0.6300 mg | INHALATION_SOLUTION | Freq: Two times a day (BID) | RESPIRATORY_TRACT | Status: DC
  Administered 2018-01-18 – 2018-01-19 (×2): 0.63 mg via RESPIRATORY_TRACT
  Filled 2018-01-18 (×3): qty 3

## 2018-01-18 MED ORDER — TRAZODONE HCL 150 MG PO TABS: 150.0000 mg | ORAL_TABLET | Freq: Every evening | ORAL | 2 refills | Status: DC | PRN
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
## 2018-01-18 NOTE — Progress Notes (Signed)
[facility] - [place] at [facility]   PATIENT NAME: [name] [name]    MR#:  [id number]  DATE OF BIRTH:  25-Apr-1960  SUBJECTIVE:  CHIEF COMPLAINT:   Chief Complaint  Patient presents with  . Shortness of Breath   Patient is breathing is improved however heart rate increases with activity into the 150s REVIEW OF SYSTEMS:    Review of Systems  Constitutional: Positive for malaise/fatigue. Negative for chills and fever.  HENT: Negative for sore throat.   Eyes: Negative for blurred vision, double vision and pain.  Respiratory: Positive for cough and shortness of breath. Negative for hemoptysis and wheezing.   Cardiovascular: Negative for chest pain, palpitations, orthopnea and leg swelling.  Gastrointestinal: Negative for abdominal pain, constipation, diarrhea, heartburn, nausea and vomiting.  Genitourinary: Negative for dysuria and hematuria.  Musculoskeletal: Negative for back pain and joint pain.  Skin: Negative for rash.  Neurological: Negative for sensory change, speech change, focal weakness and headaches.  Endo/Heme/Allergies: Does not bruise/bleed easily.  Psychiatric/Behavioral: Negative for depression. The patient is not nervous/anxious.     DRUG ALLERGIES:  Not on File  VITALS:  Blood pressure 119/71, pulse 85, temperature 98.2 F (36.8 C), temperature source Oral, resp. rate 20, height 5\' 8"  (1.727 m), weight (!) 167.8 kg, SpO2 99 %.  PHYSICAL EXAMINATION:   Physical Exam  GENERAL:  57 y.o.-year-old patient lying in the bed with no acute distress.  Morbidly obese EYES: Pupils equal, round, reactive to light and accommodation. No scleral icterus. Extraocular muscles intact.  HEENT: Head atraumatic, normocephalic. Oropharynx and nasopharynx clear.  NECK:  Supple, no jugular venous distention. No thyroid enlargement, no tenderness.  LUNGS: Normal breath sounds bilaterally, no wheezing, rales, rhonchi. No use of accessory muscles of respiration.   CARDIOVASCULAR: Irregularly irregular no murmurs, rubs, or gallops.  ABDOMEN: Soft, nontender, nondistended. Bowel sounds present. No organomegaly or mass.  EXTREMITIES: No cyanosis, clubbing.  Bilateral lower extremity edema NEUROLOGIC: Cranial nerves II through XII are intact. No focal Motor or sensory deficits b/l.   PSYCHIATRIC: The patient is alert and oriented x 3.  SKIN: No obvious rash, lesion, or ulcer.   LABORATORY PANEL:   CBC Recent Labs  Lab 01/17/18 0541  WBC 4.1  HGB 10.3*  HCT 33.8*  PLT 201   ------------------------------------------------------------------------------------------------------------------ Chemistries  Recent Labs  Lab 01/16/18 1109  01/18/18 0548  NA 138   < > 138  K 3.5   < > 3.3*  CL 101   < > 99  CO2 27   < > 31  GLUCOSE 133*   < > 110*  BUN 10   < > 23*  CREATININE 0.64   < > 0.83  CALCIUM 8.7*   < > 8.5*  AST 30  --   --   ALT 18  --   --   ALKPHOS 60  --   --   BILITOT 0.9  --   --    < > = values in this interval not displayed.   ------------------------------------------------------------------------------------------------------------------  Cardiac Enzymes Recent Labs  Lab 01/17/18 0541  TROPONINI 0.03*   ------------------------------------------------------------------------------------------------------------------  RADIOLOGY:  No results found.   ASSESSMENT AND PLAN:   Patient is a 57 year old with history of congestive heart failure presenting with shortness of breath  *Acute hypoxic respiratory failure secondary to acute on chronic congestive heart failure.   Patient symptoms improved continue IV Lasix diuresing well  *A. fib with RVR with activity go ahead place him on scheduled  metoprolol continue digoxin tinea Xarelto  *  Essential hypertension  continue lisinopril  * History of atrial fibrillation continue Xarelto  *  Sleep apnea CPAP once off BiPAP at nighttime  * Morbid obesity weight  loss recommended  * Elevated troponin due to demand ischemia   All the records are reviewed and case discussed with Care Management/Social Worker Management plans discussed with the patient, family and they are in agreement.  CODE STATUS: Partial code.  DNI  DVT Prophylaxis: SCDs  TOTAL TIME TAKING CARE OF THIS PATIENT: 30 minutes.   POSSIBLE D/C IN 1-2 DAYS, DEPENDING ON CLINICAL CONDITION.  Auburn Bilberry M.D on 01/18/2018 at 2:44 PM  Between 7am to 6pm - Pager - [phone]  After 6pm go to [email or web address] - password [email or web address]  [facility]  Office  [phone]  CC: Primary care physician; Patient, No Pcp Per  Note: This dictation was prepared with Dragon dictation along with smaller phrase technology. Any transcriptional errors that result from this process are unintentional.

## 2018-01-18 NOTE — Progress Notes (Signed)
Pt was ambulating and HR was elevated up to 160's Md was notified.

## 2018-01-19 LAB — BASIC METABOLIC PANEL
Anion gap: 8 (ref 5–15)
BUN: 22 mg/dL — ABNORMAL HIGH (ref 6–20)
CO2: 31 mmol/L (ref 22–32)
Calcium: 8.7 mg/dL — ABNORMAL LOW (ref 8.9–10.3)
Chloride: 97 mmol/L — ABNORMAL LOW (ref 98–111)
Creatinine, Ser: 0.75 mg/dL (ref 0.61–1.24)
GFR calc Af Amer: >60 mL/min (ref >60)
GFR calc non Af Amer: >60 mL/min (ref >60)
GLUCOSE: 114 mg/dL — AB (ref 70–99)
Potassium: 3.8 mmol/L (ref 3.5–5.1)
Sodium: 136 mmol/L (ref 135–145)

## 2018-01-19 MED ORDER — METOPROLOL TARTRATE 50 MG PO TABS: 100.0000 mg | ORAL_TABLET | Freq: Two times a day (BID) | ORAL | 2 refills | Status: DC

## 2018-01-19 MED ORDER — CIPROFLOXACIN HCL 0.3 % OP SOLN: 1.0000 [drp] | OPHTHALMIC | 0 refills | Status: DC

## 2018-01-19 MED ORDER — CIPROFLOXACIN HCL 0.3 % OP SOLN
1.0000 [drp] | OPHTHALMIC | Status: DC
  Administered 2018-01-19: 1 [drp] via OPHTHALMIC
  Filled 2018-01-19: qty 2.5

## 2018-01-19 NOTE — Care Management Note (Signed)
Case Management Note  Patient Details  Name: [name] [name] MRN: [id number] Date of Birth: January 31, 1961   Patient to discharge today. Patient states that he lives with his girlfriend.  Patient states that he moved to [place] 2 weeks ago. Patient has not initiated transferring his Medicaid to [place].  Patient has the contact information for DSS.    RNCM confirmed with Medication Management  That all prescriptions are available for patient at discharge at no cost.  Patient provided with application to Medication Management .    Patient has new patient appointment for wound care center 12/11.  Patient and MD aware.  Patient has new patient appointment at [facility] on 02/21/18 which is a sliding scale clinic.  Patient will be able to follow up with [facility] with out medicaid in place.  Patient is aware that he will have to request Medicaid to be transferred to [facility], and that he can request [place] clinic for continuity of care.   Subjective/Objective:                    Action/Plan:   Expected Discharge Date:  01/19/18               Expected Discharge Plan:  Home/Self Care  In-House Referral:     Discharge planning Services  CM Consult, Medication Assistance, Indigent Health Clinic  Post Acute Care Choice:    Choice offered to:     DME Arranged:    DME Agency:     HH Arranged:    HH Agency:     Status of Service:  Completed, signed off  If discussed at [employer], dates discussed:    Additional Comments:  Chapman Fitch, RN 01/19/2018, 5:17 PM

## 2018-01-19 NOTE — Discharge Summary (Signed)
[facility] - [place] at [facility], 57 y.o., DOB 28-Aug-1960, MRN [id number]. Admission date: 01/16/2018 Discharge Date 01/19/2018 Primary MD Patient, No Pcp Per Admitting Physician Auburn Bilberry, MD  Admission Diagnosis  COPD exacerbation (HCC) [J44.1] Acute congestive heart failure, unspecified heart failure type (HCC) [I50.9]  Discharge Diagnosis   Active Problems: Acute hypoxic respiratory failure secondary to acute on chronic congestive heart failure A. fib with RVR Essential hypertension History of atrial fibrillation Sleep apnea Morbid obesity Elevated troponin due to demand ischemia  Hospital Course  Patient is a 57 year old with history of congestive heart failure presenting with shortness of breath.  Patient initially required BiPAP.  He recently moved from [place] and did not have have all his medications.  Patient was noted to have acute on chronic systolic CHF.  He was admitted and diuresed.  His breathing is now normalized.  His heart rate does increase with activity he has been started on a beta-blocker.  I suspect he is very deconditioned patient states that he has had issues with heart rate for long time.   I have also referred him to congestive heart failure clinic as well as cardiology.         Consults  None  Significant Tests:  See full reports for all details     Dg Chest Port 1 View  Result Date: 01/16/2018 CLINICAL DATA:  Shortness of breath. History of congestive heart failure. EXAM: PORTABLE CHEST 1 VIEW COMPARISON:  None. FINDINGS: Cardiomegaly. Pulmonary venous hypertension. Question early interstitial edema. No visible effusion. Bony structures unremarkable. IMPRESSION: Cardiomegaly. Possible mild congestive heart failure. Question blue bloater COPD. Electronically Signed   By: Paulina Fusi M.D.   On: 01/16/2018 11:46       Today   Subjective:   [name] patient feels better shortness of breath  improved Objective:   Blood pressure (!) 139/94, pulse (!) 56, temperature 98.1 F (36.7 C), temperature source Oral, resp. rate 20, height 5\' 8"  (1.727 m), weight (!) 167.8 kg, SpO2 98 %.  .  Intake/Output Summary (Last 24 hours) at 01/19/2018 1314 Last data filed at 01/19/2018 0900 Gross per 24 hour  Intake 1320 ml  Output 4925 ml  Net -3605 ml    Exam VITAL SIGNS: Blood pressure (!) 139/94, pulse (!) 56, temperature 98.1 F (36.7 C), temperature source Oral, resp. rate 20, height 5\' 8"  (1.727 m), weight (!) 167.8 kg, SpO2 98 %.  GENERAL:  58 y.o.-year-old patient lying in the bed with no acute distress.  EYES: Pupils equal, round, reactive to light and accommodation. No scleral icterus. Extraocular muscles intact.  HEENT: Head atraumatic, normocephalic. Oropharynx and nasopharynx clear.  NECK:  Supple, no jugular venous distention. No thyroid enlargement, no tenderness.  LUNGS: Normal breath sounds bilaterally, no wheezing, rales,rhonchi or crepitation. No use of accessory muscles of respiration.  CARDIOVASCULAR: S1, S2 normal. No murmurs, rubs, or gallops.  ABDOMEN: Soft, nontender, nondistended. Bowel sounds present. No organomegaly or mass.  EXTREMITIES: No pedal edema, cyanosis, or clubbing.  NEUROLOGIC: Cranial nerves II through XII are intact. Muscle strength 5/5 in all extremities. Sensation intact. Gait not checked.  PSYCHIATRIC: The patient is alert and oriented x 3.  SKIN: No obvious rash, lesion, or ulcer.   Data Review     CBC w Diff:  Lab Results  Component Value Date   WBC 4.1 01/17/2018   HGB 10.3 (L) 01/17/2018   HCT 33.8 (L) 01/17/2018   PLT 201 01/17/2018  LYMPHOPCT 12 01/16/2018   MONOPCT 10 01/16/2018   EOSPCT 1 01/16/2018   BASOPCT 1 01/16/2018   CMP:  Lab Results  Component Value Date   NA 136 01/19/2018   K 3.8 01/19/2018   CL 97 (L) 01/19/2018   CO2 31 01/19/2018   BUN 22 (H) 01/19/2018   CREATININE 0.75 01/19/2018   PROT 7.3 01/16/2018    ALBUMIN 3.3 (L) 01/16/2018   BILITOT 0.9 01/16/2018   ALKPHOS 60 01/16/2018   AST 30 01/16/2018   ALT 18 01/16/2018  .  Micro Results Recent Results (from the past 240 hour(s))  Culture, blood (routine x 2)     Status: None (Preliminary result)   Collection Time: 01/16/18 11:09 AM  Result Value Ref Range Status   Specimen Description BLOOD RIGHT ANTECUBITAL  Final   Special Requests   Final    BOTTLES DRAWN AEROBIC AND ANAEROBIC Blood Culture adequate volume   Culture   Final    NO GROWTH 3 DAYS Performed at [facility], [address] [place], [place] [address]    Report Status PENDING  Incomplete  Culture, blood (routine x 2)     Status: None (Preliminary result)   Collection Time: 01/16/18 11:09 AM  Result Value Ref Range Status   Specimen Description BLOOD RIGHT HAND  Final   Special Requests   Final    BOTTLES DRAWN AEROBIC AND ANAEROBIC Blood Culture adequate volume   Culture   Final    NO GROWTH 3 DAYS Performed at [facility], [address] [place], [place] [address]    Report Status PENDING  Incomplete  MRSA PCR Screening     Status: None   Collection Time: 01/16/18  2:43 PM  Result Value Ref Range Status   MRSA by PCR NEGATIVE NEGATIVE Final    Comment:        The GeneXpert MRSA Assay (FDA approved for NASAL specimens only), is one component of a comprehensive MRSA colonization surveillance program. It is not intended to diagnose MRSA infection nor to guide or monitor treatment for MRSA infections. Performed at [facility], [address] [place], [place] [address]         Code Status Orders  (From admission, onward)         Start     Ordered   01/17/18 1331  Limited resuscitation (code)  Continuous    Question Answer Comment  In the event of cardiac or respiratory ARREST: Initiate Code Blue, Call Rapid Response Yes   In the event of cardiac or respiratory ARREST: Perform CPR Yes   In the event of cardiac  or respiratory ARREST: Perform Intubation/Mechanical Ventilation No   In the event of cardiac or respiratory ARREST: Use NIPPV/BiPAp only if indicated Yes   In the event of cardiac or respiratory ARREST: Administer ACLS medications if indicated Yes   In the event of cardiac or respiratory ARREST: Perform Defibrillation or Cardioversion if indicated Yes      01/17/18 1330        Code Status History    Date Active Date Inactive Code Status Order ID Comments User Context   01/16/2018 1427 01/17/2018 1330 Full Code [id number]  Auburn Bilberry, MD ED          Follow-up Information    Center, [facility]. Go on 02/21/2018.   Specialty:  General Practice Why:  @2 :30pm Contact information: [address] [place] [address] [phone]  Iran Ouch, MD. Go on 01/25/2018.   Specialty:  Cardiology Why:  @2pm  Contact information: [address] [place] [address] [phone]        [facility]. Go on 01/25/2018.   Specialty:  Wound Care Why:  @10 :30a, Contact information: [address] [phone] ar [phone]       [facility] Follow up on 02/02/2018.   Specialty:  Cardiology Why:  at 11:40am Contact information: [address] [place] [address] [phone]          Discharge Medications   Allergies as of 01/19/2018   Not on File     Medication List    STOP taking these medications   bumetanide 2 MG tablet Commonly known as:  BUMEX     TAKE these medications   ciprofloxacin 0.3 % ophthalmic solution Commonly known as:  CILOXAN Place 1 drop into both eyes every 2 (two) hours while awake. Administer 1 drop, every 2 hours, while awake, for 2 days. Then 1 drop, every 4 hours, while awake, for the next 5 days.   furosemide 40 MG tablet Commonly known as:  LASIX Take 1 tablet (40 mg  total) by mouth 2 (two) times daily.   lisinopril 2.5 MG tablet Commonly known as:  PRINIVIL,ZESTRIL Take 1 tablet (2.5 mg total) by mouth daily.   metoprolol tartrate 50 MG tablet Commonly known as:  LOPRESSOR Take 2 tablets (100 mg total) by mouth 2 (two) times daily.   nitroGLYCERIN 0.4 MG SL tablet Commonly known as:  NITROSTAT Place 1 tablet (0.4 mg total) under the tongue every 5 (five) minutes as needed for chest pain.   rivaroxaban 20 MG Tabs tablet Commonly known as:  XARELTO Take 1 tablet (20 mg total) by mouth daily.   traZODone 150 MG tablet Commonly known as:  DESYREL Take 1 tablet (150 mg total) by mouth at bedtime as needed for sleep.          Total Time in preparing paper work, data evaluation and todays exam - 35 minutes  Auburn Bilberry M.D on 01/19/2018 at 1:14 PM [employer]   Office  [phone]

## 2018-01-21 LAB — CULTURE, BLOOD (ROUTINE X 2)
Culture: NO GROWTH
Culture: NO GROWTH
Special Requests: ADEQUATE
Special Requests: ADEQUATE

## 2018-01-25 ENCOUNTER — Ambulatory Visit: Payer: Medicaid - Out of State | Admitting: Internal Medicine

## 2018-01-27 ENCOUNTER — Ambulatory Visit: Payer: Medicaid - Out of State | Admitting: Pharmacy Technician

## 2018-01-27 ENCOUNTER — Other Ambulatory Visit: Payer: Self-pay

## 2018-01-27 ENCOUNTER — Inpatient Hospital Stay
Admission: EM | Admit: 2018-01-27 | Discharge: 2018-01-29 | DRG: 291 | Disposition: A | Discharge status: Home / Self Care | Payer: Medicaid - Out of State | Attending: Internal Medicine | Admitting: Internal Medicine

## 2018-01-27 ENCOUNTER — Telehealth: Payer: Self-pay | Admitting: Pharmacy Technician

## 2018-01-27 ENCOUNTER — Emergency Department: Payer: Medicaid - Out of State

## 2018-01-27 DIAGNOSIS — J9601 Acute respiratory failure with hypoxia: Secondary | ICD-10-CM | POA: Diagnosis present

## 2018-01-27 DIAGNOSIS — I480 Paroxysmal atrial fibrillation: Secondary | ICD-10-CM | POA: Diagnosis present

## 2018-01-27 DIAGNOSIS — I5023 Acute on chronic systolic (congestive) heart failure: Secondary | ICD-10-CM | POA: Diagnosis not present

## 2018-01-27 DIAGNOSIS — J9600 Acute respiratory failure, unspecified whether with hypoxia or hypercapnia: Secondary | ICD-10-CM | POA: Diagnosis not present

## 2018-01-27 DIAGNOSIS — J441 Chronic obstructive pulmonary disease with (acute) exacerbation: Secondary | ICD-10-CM | POA: Diagnosis not present

## 2018-01-27 DIAGNOSIS — I248 Other forms of acute ischemic heart disease: Secondary | ICD-10-CM | POA: Diagnosis present

## 2018-01-27 DIAGNOSIS — Z8249 Family history of ischemic heart disease and other diseases of the circulatory system: Secondary | ICD-10-CM

## 2018-01-27 DIAGNOSIS — I429 Cardiomyopathy, unspecified: Secondary | ICD-10-CM | POA: Diagnosis present

## 2018-01-27 DIAGNOSIS — Z9119 Patient's noncompliance with other medical treatment and regimen: Secondary | ICD-10-CM

## 2018-01-27 DIAGNOSIS — Z6841 Body Mass Index (BMI) 40.0 and over, adult: Secondary | ICD-10-CM | POA: Diagnosis not present

## 2018-01-27 DIAGNOSIS — G4733 Obstructive sleep apnea (adult) (pediatric): Secondary | ICD-10-CM | POA: Diagnosis not present

## 2018-01-27 DIAGNOSIS — R0602 Shortness of breath: Secondary | ICD-10-CM | POA: Diagnosis not present

## 2018-01-27 DIAGNOSIS — Z79899 Other long term (current) drug therapy: Secondary | ICD-10-CM

## 2018-01-27 DIAGNOSIS — Z801 Family history of malignant neoplasm of trachea, bronchus and lung: Secondary | ICD-10-CM | POA: Diagnosis not present

## 2018-01-27 DIAGNOSIS — Z7901 Long term (current) use of anticoagulants: Secondary | ICD-10-CM | POA: Diagnosis not present

## 2018-01-27 DIAGNOSIS — J45901 Unspecified asthma with (acute) exacerbation: Secondary | ICD-10-CM | POA: Diagnosis present

## 2018-01-27 DIAGNOSIS — E876 Hypokalemia: Secondary | ICD-10-CM | POA: Diagnosis present

## 2018-01-27 DIAGNOSIS — J9621 Acute and chronic respiratory failure with hypoxia: Secondary | ICD-10-CM | POA: Diagnosis present

## 2018-01-27 DIAGNOSIS — I509 Heart failure, unspecified: Secondary | ICD-10-CM | POA: Diagnosis not present

## 2018-01-27 DIAGNOSIS — I4891 Unspecified atrial fibrillation: Secondary | ICD-10-CM | POA: Diagnosis not present

## 2018-01-27 DIAGNOSIS — R0600 Dyspnea, unspecified: Secondary | ICD-10-CM

## 2018-01-27 HISTORY — DX: Chronic obstructive pulmonary disease, unspecified: J44.9

## 2018-01-27 LAB — BLOOD GAS, ARTERIAL
Acid-Base Excess: 6.6 mmol/L — ABNORMAL HIGH (ref 0.0–2.0)
Bicarbonate: 29.1 mmol/L — ABNORMAL HIGH (ref 20.0–28.0)
Delivery systems: POSITIVE
Expiratory PAP: 5
FIO2: 40
Inspiratory PAP: 12
O2 Saturation: 99.7 %
Patient temperature: 37
pCO2 arterial: 34 mmHg (ref 32.0–48.0)
pH, Arterial: 7.54 — ABNORMAL HIGH (ref 7.350–7.450)
pO2, Arterial: 186 mmHg — ABNORMAL HIGH (ref 83.0–108.0)

## 2018-01-27 LAB — INFLUENZA PANEL BY PCR (TYPE A & B)
Influenza A By PCR: NEGATIVE
Influenza B By PCR: NEGATIVE

## 2018-01-27 LAB — COMPREHENSIVE METABOLIC PANEL
ALK PHOS: 58 U/L (ref 38–126)
ALT: 20 U/L (ref 0–44)
AST: 28 U/L (ref 15–41)
Albumin: 3.2 g/dL — ABNORMAL LOW (ref 3.5–5.0)
Anion gap: 10 (ref 5–15)
BUN: 9 mg/dL (ref 6–20)
CALCIUM: 8.1 mg/dL — AB (ref 8.9–10.3)
CO2: 26 mmol/L (ref 22–32)
Chloride: 100 mmol/L (ref 98–111)
Creatinine, Ser: 0.74 mg/dL (ref 0.61–1.24)
GFR calc Af Amer: >60 mL/min (ref >60)
GFR calc non Af Amer: >60 mL/min (ref >60)
Glucose, Bld: 124 mg/dL — ABNORMAL HIGH (ref 70–99)
Potassium: 3.3 mmol/L — ABNORMAL LOW (ref 3.5–5.1)
Sodium: 136 mmol/L (ref 135–145)
Total Bilirubin: 1.1 mg/dL (ref 0.3–1.2)
Total Protein: 6.8 g/dL (ref 6.5–8.1)

## 2018-01-27 LAB — CBC WITH DIFFERENTIAL/PLATELET
Abs Immature Granulocytes: 0.02 10*3/uL (ref 0.00–0.07)
Basophils Absolute: 0 10*3/uL (ref 0.0–0.1)
Basophils Relative: 1 %
Eosinophils Absolute: 0.1 10*3/uL (ref 0.0–0.5)
Eosinophils Relative: 1 %
HCT: 34.5 % — ABNORMAL LOW (ref 39.0–52.0)
Hemoglobin: 10.6 g/dL — ABNORMAL LOW (ref 13.0–17.0)
Immature Granulocytes: 0 %
Lymphocytes Relative: 15 %
Lymphs Abs: 0.8 10*3/uL (ref 0.7–4.0)
MCH: 24.7 pg — ABNORMAL LOW (ref 26.0–34.0)
MCHC: 30.7 g/dL (ref 30.0–36.0)
MCV: 80.2 fL (ref 80.0–100.0)
Monocytes Absolute: 0.7 10*3/uL (ref 0.1–1.0)
Monocytes Relative: 13 %
Neutro Abs: 3.7 10*3/uL (ref 1.7–7.7)
Neutrophils Relative %: 70 %
Platelets: 251 10*3/uL (ref 150–400)
RBC: 4.3 MIL/uL (ref 4.22–5.81)
RDW: 18.8 % — ABNORMAL HIGH (ref 11.5–15.5)
WBC: 5.3 10*3/uL (ref 4.0–10.5)
nRBC: 0 % (ref 0.0–0.2)

## 2018-01-27 LAB — GLUCOSE, CAPILLARY: Glucose-Capillary: 121 mg/dL — ABNORMAL HIGH (ref 70–99)

## 2018-01-27 LAB — TROPONIN I
Troponin I: 0.05 ng/mL (ref <0.03)
Troponin I: 0.04 ng/mL (ref <0.03)
Troponin I: 0.04 ng/mL (ref <0.03)

## 2018-01-27 LAB — ETHANOL: Alcohol, Ethyl (B): <10 mg/dL (ref <10)

## 2018-01-27 LAB — BRAIN NATRIURETIC PEPTIDE: B Natriuretic Peptide: 314 pg/mL — ABNORMAL HIGH (ref 0.0–100.0)

## 2018-01-27 MED ORDER — METOPROLOL TARTRATE 50 MG PO TABS
50.0000 mg | ORAL_TABLET | Freq: Once | ORAL | Status: AC
  Administered 2018-01-27: 50 mg via ORAL

## 2018-01-27 MED ORDER — ONDANSETRON HCL 4 MG PO TABS: 4.0000 mg | ORAL_TABLET | Freq: Four times a day (QID) | ORAL | Status: DC | PRN

## 2018-01-27 MED ORDER — TRAZODONE HCL 50 MG PO TABS
150.0000 mg | ORAL_TABLET | Freq: Every evening | ORAL | Status: DC | PRN
  Administered 2018-01-28: 150 mg via ORAL
  Filled 2018-01-27: qty 3

## 2018-01-27 MED ORDER — ACETAMINOPHEN 325 MG PO TABS
650.0000 mg | ORAL_TABLET | Freq: Four times a day (QID) | ORAL | Status: DC | PRN
  Administered 2018-01-27 – 2018-01-28 (×2): 650 mg via ORAL
  Filled 2018-01-27 (×2): qty 2

## 2018-01-27 MED ORDER — RIVAROXABAN 20 MG PO TABS
20.0000 mg | ORAL_TABLET | ORAL | Status: AC
  Administered 2018-01-27: 20 mg via ORAL
  Filled 2018-01-27 (×2): qty 1

## 2018-01-27 MED ORDER — ACETAMINOPHEN 650 MG RE SUPP: 650.0000 mg | Freq: Four times a day (QID) | RECTAL | Status: DC | PRN

## 2018-01-27 MED ORDER — BUDESONIDE 0.5 MG/2ML IN SUSP
0.5000 mg | Freq: Two times a day (BID) | RESPIRATORY_TRACT | Status: DC
  Administered 2018-01-27 – 2018-01-29 (×4): 0.5 mg via RESPIRATORY_TRACT
  Filled 2018-01-27 (×5): qty 2

## 2018-01-27 MED ORDER — POTASSIUM CHLORIDE CRYS ER 20 MEQ PO TBCR
40.0000 meq | EXTENDED_RELEASE_TABLET | Freq: Once | ORAL | Status: AC
  Administered 2018-01-27: 40 meq via ORAL
  Filled 2018-01-27: qty 2

## 2018-01-27 MED ORDER — LISINOPRIL 5 MG PO TABS
2.5000 mg | ORAL_TABLET | Freq: Every day | ORAL | Status: DC
  Administered 2018-01-28: 2.5 mg via ORAL
  Filled 2018-01-27 (×2): qty 1

## 2018-01-27 MED ORDER — METHYLPREDNISOLONE SODIUM SUCC 125 MG IJ SOLR
125.0000 mg | INTRAMUSCULAR | Status: AC
  Administered 2018-01-27: 125 mg via INTRAVENOUS
  Filled 2018-01-27: qty 2

## 2018-01-27 MED ORDER — RIVAROXABAN 20 MG PO TABS
20.0000 mg | ORAL_TABLET | Freq: Every day | ORAL | Status: DC
  Administered 2018-01-28: 20 mg via ORAL
  Filled 2018-01-27 (×2): qty 1

## 2018-01-27 MED ORDER — FUROSEMIDE 10 MG/ML IJ SOLN
40.0000 mg | Freq: Once | INTRAMUSCULAR | Status: AC
  Administered 2018-01-27: 40 mg via INTRAVENOUS
  Filled 2018-01-27: qty 4

## 2018-01-27 MED ORDER — ASPIRIN EC 325 MG PO TBEC
325.0000 mg | DELAYED_RELEASE_TABLET | Freq: Every day | ORAL | Status: DC
  Administered 2018-01-28: 325 mg via ORAL
  Filled 2018-01-27 (×2): qty 1

## 2018-01-27 MED ORDER — METOPROLOL TARTRATE 50 MG PO TABS
ORAL_TABLET | ORAL | Status: AC
  Filled 2018-01-27: qty 1

## 2018-01-27 MED ORDER — FUROSEMIDE 10 MG/ML IJ SOLN
40.0000 mg | Freq: Two times a day (BID) | INTRAMUSCULAR | Status: DC
  Administered 2018-01-27 – 2018-01-28 (×2): 40 mg via INTRAVENOUS
  Filled 2018-01-27 (×2): qty 4

## 2018-01-27 MED ORDER — POTASSIUM CHLORIDE 20 MEQ PO PACK
20.0000 meq | PACK | Freq: Two times a day (BID) | ORAL | Status: DC
  Administered 2018-01-27 – 2018-01-28 (×3): 20 meq via ORAL
  Filled 2018-01-27 (×4): qty 1

## 2018-01-27 MED ORDER — IPRATROPIUM-ALBUTEROL 0.5-2.5 (3) MG/3ML IN SOLN
3.0000 mL | Freq: Four times a day (QID) | RESPIRATORY_TRACT | Status: DC
  Administered 2018-01-27 – 2018-01-28 (×5): 3 mL via RESPIRATORY_TRACT
  Filled 2018-01-27 (×6): qty 3

## 2018-01-27 MED ORDER — ALBUTEROL SULFATE (2.5 MG/3ML) 0.083% IN NEBU
5.0000 mg | INHALATION_SOLUTION | Freq: Once | RESPIRATORY_TRACT | Status: AC
  Administered 2018-01-27: 5 mg via RESPIRATORY_TRACT
  Filled 2018-01-27: qty 6

## 2018-01-27 MED ORDER — METOPROLOL TARTRATE 50 MG PO TABS
100.0000 mg | ORAL_TABLET | Freq: Two times a day (BID) | ORAL | Status: DC
  Administered 2018-01-28: 100 mg via ORAL
  Filled 2018-01-27 (×3): qty 2

## 2018-01-27 MED ORDER — ONDANSETRON HCL 4 MG/2ML IJ SOLN
4.0000 mg | Freq: Four times a day (QID) | INTRAMUSCULAR | Status: DC | PRN
  Filled 2018-01-27: qty 2

## 2018-01-27 MED ORDER — POTASSIUM CHLORIDE CRYS ER 20 MEQ PO TBCR: 20.0000 meq | EXTENDED_RELEASE_TABLET | Freq: Two times a day (BID) | ORAL | Status: DC

## 2018-01-27 MED ORDER — NITROGLYCERIN 0.4 MG SL SUBL: 0.4000 mg | SUBLINGUAL_TABLET | SUBLINGUAL | Status: DC | PRN

## 2018-01-27 NOTE — ED Triage Notes (Signed)
Pt c/o shortness of breath. Bipap on.

## 2018-01-27 NOTE — ED Notes (Signed)
Attempted to call report, nurse unavailable at this time.

## 2018-01-27 NOTE — ED Provider Notes (Addendum)
[facility] Emergency Department Provider Note  ____________________________________________   I have reviewed the triage vital signs and the nursing notes. Where available I have reviewed prior notes and, if possible and indicated, outside hospital notes.    HISTORY  Chief Complaint Shortness of Breath    HPI [name] [name] is a 57 y.o. male.  COPD, morbid obesity A. fib on blood thinners, asthma, COPD, reliant upon CPAP, CHF with a EF of 30%, presents today complaining of increased shortness of breath over the last couple days.  No fever.  Has had some slight cough.  Denies chest pain.  Patient was on his own BiPAP when EMS found him.  States he is compliant with his medications.  He does states that he has been getting too short of breath to get around.  He states he has been using his BiPAP more and more.  History is somewhat limited, patient is in respiratory distress Patient has received no further medications from EMS they do not auscultate wheeze, they did place him on BiPAP and bring him in   Past Medical History:  Diagnosis Date  . A-fib (HCC)   . Asthma   . CHF (congestive heart failure) (HCC)   . Sleep apnea     Patient Active Problem List   Diagnosis Date Noted  . Acute respiratory failure (HCC) 01/16/2018    Past Surgical History:  Procedure Laterality Date  . hearnia repair    . LEG SURGERY      Prior to Admission medications   Medication Sig Start Date End Date Taking? Authorizing Provider  ciprofloxacin (CILOXAN) 0.3 % ophthalmic solution Place 1 drop into both eyes every 2 (two) hours while awake. Administer 1 drop, every 2 hours, while awake, for 2 days. Then 1 drop, every 4 hours, while awake, for the next 5 days. 01/19/18   Auburn Bilberry, MD  furosemide (LASIX) 40 MG tablet Take 1 tablet (40 mg total) by mouth 2 (two) times daily. 01/18/18 01/18/19  Auburn Bilberry, MD  lisinopril (PRINIVIL,ZESTRIL) 2.5 MG tablet Take 1 tablet  (2.5 mg total) by mouth daily. 01/18/18   Auburn Bilberry, MD  metoprolol tartrate (LOPRESSOR) 50 MG tablet Take 2 tablets (100 mg total) by mouth 2 (two) times daily. 01/19/18   Auburn Bilberry, MD  nitroGLYCERIN (NITROSTAT) 0.4 MG SL tablet Place 1 tablet (0.4 mg total) under the tongue every 5 (five) minutes as needed for chest pain. 01/18/18   Auburn Bilberry, MD  rivaroxaban (XARELTO) 20 MG TABS tablet Take 1 tablet (20 mg total) by mouth daily. 01/18/18   Auburn Bilberry, MD  traZODone (DESYREL) 150 MG tablet Take 1 tablet (150 mg total) by mouth at bedtime as needed for sleep. 01/18/18   Auburn Bilberry, MD    Allergies Patient has no known allergies.  Family History  Problem Relation Age of Onset  . Heart failure Mother     Social History Social History   Tobacco Use  . Smoking status: Never Smoker  . Smokeless tobacco: Never Used  Substance Use Topics  . Alcohol use: Never    Frequency: Never  . Drug use: Never    Review of Systems Constitutional: No fever/chills Eyes: No visual changes. ENT: No sore throat. No stiff neck no neck pain Cardiovascular: Denies chest pain. Respiratory: + shortness of breath. Gastrointestinal:   no vomiting.  No diarrhea.  No constipation. Genitourinary: Negative for dysuria. Musculoskeletal: Negative lower extremity swelling Skin: Negative for rash. Neurological: Negative for severe headaches,  focal weakness or numbness.   ____________________________________________   PHYSICAL EXAM:  VITAL SIGNS: ED Triage Vitals [01/27/18 1044]  Enc Vitals Group     BP      Pulse Rate 88     Resp      Temp      Temp src      SpO2 100 %     Weight (!) 369 lb 14.9 oz (167.8 kg)     Height      Head Circumference      Peak Flow      Pain Score 0     Pain Loc      Pain Edu?      Excl. in GC?     Constitutional: Is alert and oriented he looks anxious on MS BiPAP.  Morbidly obese gentleman. Eyes: Conjunctivae are normal Head:  Atraumatic HEENT: No congestion/rhinnorhea. Mucous membranes are moist.  Oropharynx non-erythematous Neck:   Nontender with no meningismus, no masses, no stridor Cardiovascular: Normal rate, regular rhythm. Grossly normal heart sounds.  Good peripheral circulation. Respiratory: Creased work of breathing noted initially, some of this could be related to anxiety.  Diminished in the bases no acute wheeze or rhonchi appreciated or rales.  Patient morbid obesity limits exam Abdominal: Soft and nontender. No distention. No guarding no rebound Back:  There is no focal tenderness or step off.  there is no midline tenderness there are no lesions noted. there is no CVA tenderness Musculoskeletal: No lower extremity tenderness, no upper extremity tenderness. No joint effusions, no DVT signs strong distal pulses possibly mild edema difficult to determine given patient's morbid obesity Neurologic:  Normal speech and language. No gross focal neurologic deficits are appreciated.  Skin:  Skin is warm, dry and intact. No rash noted. Psychiatric: Mood and affect are normal. Speech and behavior are normal.  ____________________________________________   LABS (all labs ordered are listed, but only abnormal results are displayed)  Labs Reviewed  ETHANOL  COMPREHENSIVE METABOLIC PANEL  TROPONIN I  CBC WITH DIFFERENTIAL/PLATELET  BRAIN NATRIURETIC PEPTIDE    Pertinent labs  results that were available during my care of the patient were reviewed by me and considered in my medical decision making (see chart for details). ____________________________________________  EKG  I personally interpreted any EKGs ordered by me or triage Atrial fibrillation rate 95 bpm, intraventricular conduction delay, left bundle branch block, seen on prior EKG, ____________________________________________  RADIOLOGY  Pertinent labs & imaging results that were available during my care of the patient were reviewed by me and  considered in my medical decision making (see chart for details). If possible, patient and/or family made aware of any abnormal findings.  No results found. ____________________________________________    PROCEDURES  Procedure(s) performed: None  Procedures  Critical Care performed: CRITICAL CARE Performed by: Jeanmarie Plant   Total critical care time: 38 minutes  Critical care time was exclusive of separately billable procedures and treating other patients.  Critical care was necessary to treat or prevent imminent or life-threatening deterioration.  Critical care was time spent personally by me on the following activities: development of treatment plan with patient and/or surrogate as well as nursing, discussions with consultants, evaluation of patient's response to treatment, examination of patient, obtaining history from patient or surrogate, ordering and performing treatments and interventions, ordering and review of laboratory studies, ordering and review of radiographic studies, pulse oximetry and re-evaluation of patient's condition.   ____________________________________________   INITIAL IMPRESSION / ASSESSMENT AND PLAN / ED  COURSE  Pertinent labs & imaging results that were available during my care of the patient were reviewed by me and considered in my medical decision making (see chart for details).  Patient with a EF of 30% history of asthma morbid obesity, presents today complaining of increased shortness of breath.  I do not auscultate a wheeze but he is somewhat diminished, this could simply be artifact from his body habitus.  We are giving him albuterol get a chest x-ray BNP troponin EKG, I placed him on our BiPAP and he is much more comfortable we are giving him a nebulizer, given his low EF I will give him Lasix although he states he is compliant with all of his medications including his blood thinner and his diuretic which he states now begins with B, presumably  this is Bumex.  ----------------------------------------- 11:35 AM on 01/27/2018 -----------------------------------------  Patient is diuresing, chest x-ray reviewed personally by me shows no significant abnormality but is limited by obesity.  Patient feeling better he is diuresing.  He is moving better air after neb.  The rest of the blood work is pending    ____________________________________________   FINAL CLINICAL IMPRESSION(S) / ED DIAGNOSES  Final diagnoses:  SOB (shortness of breath)      This chart was dictated using voice recognition software.  Despite best efforts to proofread,  errors can occur which can change meaning.      Jeanmarie Plant, MD 01/27/18 1051    Jeanmarie Plant, MD 01/27/18 1135    Jeanmarie Plant, MD 01/27/18 1136

## 2018-01-27 NOTE — ED Notes (Signed)
Pt being noncompliant about keeping monitoring cords on.  Pt ambulates with steady gait and instructed to use call bell if he has needs but patient found several times to have removed cords and walked self to bathroom without using call bell.

## 2018-01-27 NOTE — Progress Notes (Signed)
Patient ID: [name] [name], male   DOB: 08-11-1960, 57 y.o.   MRN: [id number]  ACP note  Patient present  Diagnosis: Acute hypoxic respiratory failure, acute on chronic systolic congestive heart failure, asthma COPD exacerbation, atrial fibrillation, morbid obesity and sleep apnea, hypokalemia.  CODE STATUS discussed and patient is a full code.  Plan.  BiPAP until he is able to move better air.  Treat congestive heart failure with IV Lasix, treat asthma COPD exacerbation with steroids and nebulizer treatments.  Time spent on ACP discussion 17 minutes Dr. Alford Highland

## 2018-01-27 NOTE — Telephone Encounter (Signed)
Patient has SSI.  Was approved to receive Medicaid in [place].  Has recently relocated to [facility].  Has applied for Medicaid in [place].  Approved till 03/18/18 or when Medicaid goes into effect for [place].  Sherilyn Dacosta Care Manager [facility]

## 2018-01-27 NOTE — H&P (Signed)
[facility] [place] at [facility]   PATIENT NAME: [name] [name]    MR#:  [id number]  DATE OF BIRTH:  01-03-1961  DATE OF ADMISSION:  01/27/2018  PRIMARY CARE PHYSICIAN: Patient, No Pcp Per   REQUESTING/REFERRING PHYSICIAN: Dr Ileana Roup  CHIEF COMPLAINT:   Chief Complaint  Patient presents with  . Shortness of Breath    HISTORY OF PRESENT ILLNESS:  [name] [name]  is a 57 y.o. male with a known history of CHF and asthma and COPD and sleep apnea presents with shortness of breath.  He recently moved from [place] and was in the hospital and recently discharged for CHF exacerbation and rapid atrial fibrillation.  He complains of shortness of breath starting all of a sudden this morning.  He does have some left chest pain.  Also having some pain in his back and some dark urine.  Not much cough.  He states he has had a little fever and chills at home and feeling very fatigued.  Some runny nose and eye watering.  In the ER he was placed on BiPAP for respiratory distress.  Hospitalist services were contacted for further evaluation.  Patient states he does not walk around very much.  PAST MEDICAL HISTORY:   Past Medical History:  Diagnosis Date  . A-fib (HCC)   . Asthma   . CHF (congestive heart failure) (HCC)   . COPD (chronic obstructive pulmonary disease) (HCC)   . Sleep apnea     PAST SURGICAL HISTORY:   Past Surgical History:  Procedure Laterality Date  . hearnia repair    . LEG SURGERY      SOCIAL HISTORY:   Social History   Tobacco Use  . Smoking status: Never Smoker  . Smokeless tobacco: Never Used  Substance Use Topics  . Alcohol use: Never    Frequency: Never    FAMILY HISTORY:   Family History  Problem Relation Age of Onset  . Heart failure Mother   . Lung cancer Mother   . Lung cancer Father     DRUG ALLERGIES:  No Known Allergies  REVIEW OF SYSTEMS:  CONSTITUTIONAL: Some fever, chills and sweats.  Positive for  fatigue.  EYES: No blurred or double vision.  Positive for runny eyes. EARS, NOSE, AND THROAT: No tinnitus or ear pain. No sore throat.  Positive runny nose. RESPIRATORY: No cough.positive for shortness of breath.  No wheezing or hemoptysis.  CARDIOVASCULAR: Positive for left-sided chest pain.  Positive for orthopnea and edema.  GASTROINTESTINAL: Some nausea.  No vomiting, diarrhea or abdominal pain. No blood in bowel movements GENITOURINARY: No dysuria, hematuria.  ENDOCRINE: No polyuria, nocturia,  HEMATOLOGY: No anemia, easy bruising or bleeding SKIN: No rash or lesion. MUSCULOSKELETAL: Positive for joint pain NEUROLOGIC: No tingling, numbness, weakness.  PSYCHIATRY: No anxiety or depression.   MEDICATIONS AT HOME:   Prior to Admission medications   Medication Sig Start Date End Date Taking? Authorizing Provider  aspirin 325 MG tablet Take 325 mg by mouth daily.   Yes [provider]  bumetanide (BUMEX) 2 MG tablet Take 2 mg by mouth 2 (two) times daily.   Yes [provider]  ciprofloxacin (CILOXAN) 0.3 % ophthalmic solution Place 1 drop into both eyes every 2 (two) hours while awake. Administer 1 drop, every 2 hours, while awake, for 2 days. Then 1 drop, every 4 hours, while awake, for the next 5 days. 01/19/18  Yes Auburn Bilberry, MD  furosemide (LASIX) 40 MG tablet Take  1 tablet (40 mg total) by mouth 2 (two) times daily. 01/18/18 01/18/19 Yes Auburn Bilberry, MD  lisinopril (PRINIVIL,ZESTRIL) 2.5 MG tablet Take 1 tablet (2.5 mg total) by mouth daily. 01/18/18  Yes Auburn Bilberry, MD  metoprolol tartrate (LOPRESSOR) 50 MG tablet Take 2 tablets (100 mg total) by mouth 2 (two) times daily. 01/19/18  Yes Auburn Bilberry, MD  nitroGLYCERIN (NITROSTAT) 0.4 MG SL tablet Place 1 tablet (0.4 mg total) under the tongue every 5 (five) minutes as needed for chest pain. 01/18/18  Yes Auburn Bilberry, MD  rivaroxaban (XARELTO) 20 MG TABS tablet Take 1 tablet (20 mg total) by mouth  daily. 01/18/18  Yes Auburn Bilberry, MD  traZODone (DESYREL) 150 MG tablet Take 1 tablet (150 mg total) by mouth at bedtime as needed for sleep. 01/18/18  Yes Auburn Bilberry, MD      VITAL SIGNS:  Pulse 88, weight (!) 167.8 kg, SpO2 100 %.  Last heart rate recorded 88 bpm, last blood pressure recorded 139/94, temperature 98.1  PHYSICAL EXAMINATION:  GENERAL:  57 y.o.-year-old patient sitting in the bed with acute distress.  EYES: Pupils equal, round, reactive to light and accommodation. No scleral icterus. Extraocular muscles intact.  HEENT: Head atraumatic, normocephalic. Oropharynx and nasopharynx clear.  NECK:  Supple, no jugular venous distention. No thyroid enlargement, no tenderness.  LUNGS: Decreased breath sounds bilateral.  Poor air entry bilaterally.  Slight upper airway expiratory wheeze.  No rales,rhonchi or crepitation.  Positive use of accessory muscles of respiration.  CARDIOVASCULAR: S1, S2 regular no murmurs, rubs, or gallops.  ABDOMEN: Soft, nontender, nondistended. Bowel sounds present. No organomegaly or mass.  EXTREMITIES: 3+ pedal edema, no cyanosis, or clubbing.  NEUROLOGIC: Cranial nerves II through XII are intact. Muscle strength 5/5 in all extremities. Sensation intact. Gait not checked.  PSYCHIATRIC: The patient is alert and oriented x 3.  SKIN: Chronic lower extremity skin discoloration.  LABORATORY PANEL:   CBC Recent Labs  Lab 01/27/18 1044  WBC 5.3  HGB 10.6*  HCT 34.5*  PLT 251   ------------------------------------------------------------------------------------------------------------------  Chemistries  Recent Labs  Lab 01/27/18 1044  NA 136  K 3.3*  CL 100  CO2 26  GLUCOSE 124*  BUN 9  CREATININE 0.74  CALCIUM 8.1*  AST 28  ALT 20  ALKPHOS 58  BILITOT 1.1   ------------------------------------------------------------------------------------------------------------------  Cardiac Enzymes Recent Labs  Lab 01/27/18 1044   TROPONINI 0.05*   ------------------------------------------------------------------------------------------------------------------  RADIOLOGY:  Dg Chest Port 1 View  Result Date: 01/27/2018 CLINICAL DATA:  Shortness of breath. History of congestive heart failure. EXAM: PORTABLE CHEST 1 VIEW COMPARISON:  01/16/2018 FINDINGS: Chronic cardiomegaly. The lungs are clear. The vascularity is normal. No effusions. No edema. IMPRESSION: no acute cardiopulmonary abnormality. Chronic cardiomegaly. Electronically Signed   By: Paulina Fusi M.D.   On: 01/27/2018 11:17    EKG:   Ordered by me  IMPRESSION AND PLAN:   1.  Acute hypoxic respiratory failure.  Patient on BiPAP 40%.  Case discussed with critical care specialist.  CCU stepdown admission.  ABG ordered. 2.  Acute on chronic systolic congestive heart failure with cardiomyopathy.  Diuresis with 40 mg of Lasix IV twice daily.  On low-dose ACE inhibitor and metoprolol. 3.  COPD and asthma exacerbation with poor air entry.  Start Solu-Medrol.  Nebulizer treatments 4.  Atrial fibrillation.  Continue metoprolol and Xarelto.  Give both dosages this morning since he did not take it. 5.  Urinary symptoms send off a urinalysis urine culture 6.  Borderline elevated troponin likely secondary to demand ischemia with acute hypoxic respiratory failure 7.  Sleep apnea and morbid obesity.  BMI of 56.25.  Weight loss needed.  The patient does wear CPAP at night at home. 8.  Hypokalemia.  Replace potassium orally with diuresis.  All the records are reviewed and case discussed with ED provider. Management plans discussed with the patient, family and they are in agreement.  CODE STATUS: Full code  TOTAL TIME TAKING CARE OF THIS PATIENT: 50 minutes, including ACP time.    Alford Highland M.D on 01/27/2018 at 12:58 PM  Between 7am to 6pm - Pager - [phone]  After 6pm call admission pager [phone]  [facility] Office   [phone]  CC: Primary care physician; Patient, No Pcp Per

## 2018-01-27 NOTE — Progress Notes (Addendum)
Phone Consult regarding [facility]'s program.  Patient is applying for Medicaid.  Eligible to receive medication assistance with [facility] until  Medicaid goes into effect.  Read [facility]'s contract.  Patient verbally acknowledged that he understood.  Mailing contract for patient to sign and return.  Read [employer] [facility] attestation to patient.  Patient verbally consented.  Sending referral to [employer].  Sherilyn Dacosta Care Manager [facility]

## 2018-01-27 NOTE — ED Notes (Signed)
Admitting MD at bedside.

## 2018-01-27 NOTE — ED Notes (Signed)
Attempted to call report but floor unaware of assigned patient.

## 2018-01-27 NOTE — Consult Note (Signed)
[facility] Pulmonary/CC Medicine Consultation      Name: [name] [name] MRN: [id number] DOB: [birth year]    ADMISSION DATE:  01/27/2018 CONSULTATION DATE:  01/27/18  REFERRING MD :  Alford Highland  CHIEF COMPLAINT:    sob/ resp failure   HISTORY OF PRESENT ILLNESS   [name] [name]  is a 57 y.o. male with a known history of CHF and asthma and COPD and sleep apnea presents with shortness of breath.  He recently moved from [place] and was in the hospital and recently discharged for CHF exacerbation and rapid atrial fibrillation.  He complained of shortness of breath starting all of a sudden this morning in the ER with some left chest pain. He reported some l fever and chills at home and feeling very fatigued with  runny nose and eye watering.  In the ER he was placed on BiPAP for respiratory distress.  At present he is sitting up in chair, off bipap and doing ok but does c/o some sob  SIGNIFICANT EVENTS   On bipap    PAST MEDICAL HISTORY    :  Past Medical History:  Diagnosis Date  . A-fib (HCC)   . Asthma   . CHF (congestive heart failure) (HCC)   . COPD (chronic obstructive pulmonary disease) (HCC)   . Sleep apnea    Past Surgical History:  Procedure Laterality Date  . hearnia repair    . LEG SURGERY     Prior to Admission medications   Medication Sig Start Date End Date Taking? Authorizing Provider  aspirin 325 MG tablet Take 325 mg by mouth daily.   Yes [provider]  bumetanide (BUMEX) 2 MG tablet Take 2 mg by mouth 2 (two) times daily.   Yes [provider]  ciprofloxacin (CILOXAN) 0.3 % ophthalmic solution Place 1 drop into both eyes every 2 (two) hours while awake. Administer 1 drop, every 2 hours, while awake, for 2 days. Then 1 drop, every 4 hours, while awake, for the next 5 days. 01/19/18  Yes Auburn Bilberry, MD  furosemide (LASIX) 40 MG tablet Take 1 tablet (40 mg total) by mouth 2 (two) times daily. 01/18/18 01/18/19 Yes Auburn Bilberry, MD  lisinopril (PRINIVIL,ZESTRIL) 2.5 MG tablet Take 1 tablet (2.5 mg total) by mouth daily. 01/18/18  Yes Auburn Bilberry, MD  metoprolol tartrate (LOPRESSOR) 50 MG tablet Take 2 tablets (100 mg total) by mouth 2 (two) times daily. 01/19/18  Yes Auburn Bilberry, MD  nitroGLYCERIN (NITROSTAT) 0.4 MG SL tablet Place 1 tablet (0.4 mg total) under the tongue every 5 (five) minutes as needed for chest pain. 01/18/18  Yes Auburn Bilberry, MD  rivaroxaban (XARELTO) 20 MG TABS tablet Take 1 tablet (20 mg total) by mouth daily. 01/18/18  Yes Auburn Bilberry, MD  traZODone (DESYREL) 150 MG tablet Take 1 tablet (150 mg total) by mouth at bedtime as needed for sleep. 01/18/18  Yes Auburn Bilberry, MD   No Known Allergies   FAMILY HISTORY   Family History  Problem Relation Age of Onset  . Heart failure Mother   . Lung cancer Mother   . Lung cancer Father       SOCIAL HISTORY    reports that he has never smoked. He has never used smokeless tobacco. He reports that he does not drink alcohol or use drugs.  ROS    VITAL SIGNS    Temp:  [98.8 F (37.1 C)] 98.8 F (37.1 C) (12/13 1302) Pulse Rate:  [55-101] 97 (12/13  1316) Resp:  [20-23] 20 (12/13 1302) BP: (146-164)/(95-100) 152/100 (12/13 1316) SpO2:  [98 %-100 %] 100 % (12/13 1307) Weight:  [167.8 kg] 167.8 kg (12/13 1044) HEMODYNAMICS:   VENTILATOR SETTINGS:   INTAKE / OUTPUT: No intake or output data in the 24 hours ending 01/27/18 1635     PHYSICAL EXAM   Physical Exam  Obese man. No obvious distress No pallor/cyanosis or clubbing Chest cta few basal crackles cvs s1 and s2 abd soft non tender Cns awake and alert non focal +3 pedal edema with chronic skin changes     LABS   LABS:  CBC Recent Labs  Lab 01/27/18 1044  WBC 5.3  HGB 10.6*  HCT 34.5*  PLT 251   Coag's No results for input(s): APTT, INR in the last 168 hours. BMET Recent Labs  Lab 01/27/18 1044  NA 136  K 3.3*  CL 100  CO2 26    BUN 9  CREATININE 0.74  GLUCOSE 124*   Electrolytes Recent Labs  Lab 01/27/18 1044  CALCIUM 8.1*   Sepsis Markers No results for input(s): LATICACIDVEN, PROCALCITON, O2SATVEN in the last 168 hours. ABG Recent Labs  Lab 01/27/18 1249  PHART 7.54*  PCO2ART 34  PO2ART 186*   Liver Enzymes Recent Labs  Lab 01/27/18 1044  AST 28  ALT 20  ALKPHOS 58  BILITOT 1.1  ALBUMIN 3.2*   Cardiac Enzymes Recent Labs  Lab 01/27/18 1044 01/27/18 1430  TROPONINI 0.05* 0.04*   Glucose Recent Labs  Lab 01/27/18 1411  GLUCAP 121*     No results found for this or any previous visit (from the past 240 hour(s)).   Current Facility-Administered Medications:  .  acetaminophen (TYLENOL) tablet 650 mg, 650 mg, Oral, Q6H PRN **OR** acetaminophen (TYLENOL) suppository 650 mg, 650 mg, Rectal, Q6H PRN, Alford Highland, MD .  Melene Muller ON 01/28/2018] aspirin EC tablet 325 mg, 325 mg, Oral, Daily, Wieting, Richard, MD .  budesonide (PULMICORT) nebulizer solution 0.5 mg, 0.5 mg, Nebulization, BID, Wieting, Richard, MD .  furosemide (LASIX) injection 40 mg, 40 mg, Intravenous, BID, Wieting, Richard, MD .  ipratropium-albuterol (DUONEB) 0.5-2.5 (3) MG/3ML nebulizer solution 3 mL, 3 mL, Nebulization, Q6H, Wieting, Richard, MD, 3 mL at 01/27/18 1424 .  [START ON 01/28/2018] lisinopril (PRINIVIL,ZESTRIL) tablet 2.5 mg, 2.5 mg, Oral, Daily, Wieting, Richard, MD .  metoprolol tartrate (LOPRESSOR) tablet 100 mg, 100 mg, Oral, BID, Wieting, Richard, MD .  nitroGLYCERIN (NITROSTAT) SL tablet 0.4 mg, 0.4 mg, Sublingual, Q5 min PRN, Wieting, Richard, MD .  ondansetron (ZOFRAN) tablet 4 mg, 4 mg, Oral, Q6H PRN **OR** ondansetron (ZOFRAN) injection 4 mg, 4 mg, Intravenous, Q6H PRN, Wieting, Richard, MD .  potassium chloride SA (K-DUR,KLOR-CON) CR tablet 20 mEq, 20 mEq, Oral, BID, Wieting, Richard, MD .  Melene Muller ON 01/28/2018] rivaroxaban (XARELTO) tablet 20 mg, 20 mg, Oral, Daily, Wieting, Richard, MD .   rivaroxaban (XARELTO) tablet 20 mg, 20 mg, Oral, STAT, Wieting, Richard, MD .  traZODone (DESYREL) tablet 150 mg, 150 mg, Oral, QHS PRN, Alford Highland, MD  IMAGING    Dg Chest Port 1 View  Result Date: 01/27/2018 CLINICAL DATA:  Shortness of breath. History of congestive heart failure. EXAM: PORTABLE CHEST 1 VIEW COMPARISON:  01/16/2018 FINDINGS: Chronic cardiomegaly. The lungs are clear. The vascularity is normal. No effusions. No edema. IMPRESSION: no acute cardiopulmonary abnormality. Chronic cardiomegaly. Electronically Signed   By: Paulina Fusi M.D.   On: 01/27/2018 11:17      Indwelling Urinary  Catheter continued, requirement due to   Reason to continue Indwelling Urinary Catheter for strict Intake/Output monitoring for hemodynamic instability   Central Line continued, requirement due to   Reason to continue [employer] Monitoring of central venous pressure or other hemodynamic parameters   Ventilator continued, requirement due to, resp failure    Ventilator Sedation RASS 0 to -2   MAJOR EVENTS/TEST RESULTS:   INDWELLING DEVICES::  MICRO DATA: MRSA PCR  Urine  Blood Resp   ANTIMICROBIALS:     ASSESSMENT/PLAN    1)Acute resp failure, no gross evidence of pneumonia ? CHF.  OSA with morbid obesity. Cont bipap as tolerated. Follow ABG Cont nebs  2)h/o CHF with A fib, lasix 40 mg bid, cont xarelta, lopressors ACEI  3. Mildly elevated troponin, likely demand related will follow  4. Correct lytes  5. URTI, follow for now  Full code,     I have personally obtained a history, examined the patient, evaluated laboratory and independently reviewed  imaging results, formulated the assessment and plan and placed orders.   Critical Care Time devoted to patient care services described in this note is 35 minutes.   Overall, patient is critically ill, prognosis is guarded. Patient at high risk for cardiac arrest and death.    Arbie Cookey, MD    01/27/2018 4:35 PM [facility]

## 2018-01-28 LAB — LIPID PANEL
Cholesterol: 185 mg/dL (ref 0–200)
HDL: 81 mg/dL (ref >40)
LDL Cholesterol: 94 mg/dL (ref 0–99)
TRIGLYCERIDES: 48 mg/dL (ref <150)
Total CHOL/HDL Ratio: 2.3 RATIO
VLDL: 10 mg/dL (ref 0–40)

## 2018-01-28 LAB — BASIC METABOLIC PANEL
Anion gap: 8 (ref 5–15)
BUN: 11 mg/dL (ref 6–20)
CALCIUM: 8.5 mg/dL — AB (ref 8.9–10.3)
CO2: 28 mmol/L (ref 22–32)
Chloride: 98 mmol/L (ref 98–111)
Creatinine, Ser: 0.88 mg/dL (ref 0.61–1.24)
GFR calc Af Amer: >60 mL/min (ref >60)
GFR calc non Af Amer: >60 mL/min (ref >60)
Glucose, Bld: 191 mg/dL — ABNORMAL HIGH (ref 70–99)
Potassium: 3.8 mmol/L (ref 3.5–5.1)
Sodium: 134 mmol/L — ABNORMAL LOW (ref 135–145)

## 2018-01-28 LAB — CBC
HCT: 35.2 % — ABNORMAL LOW (ref 39.0–52.0)
HEMOGLOBIN: 10.6 g/dL — AB (ref 13.0–17.0)
MCH: 24.4 pg — ABNORMAL LOW (ref 26.0–34.0)
MCHC: 30.1 g/dL (ref 30.0–36.0)
MCV: 80.9 fL (ref 80.0–100.0)
PLATELETS: 220 10*3/uL (ref 150–400)
RBC: 4.35 MIL/uL (ref 4.22–5.81)
RDW: 18.6 % — ABNORMAL HIGH (ref 11.5–15.5)
WBC: 4.8 10*3/uL (ref 4.0–10.5)
nRBC: 0 % (ref 0.0–0.2)

## 2018-01-28 LAB — BRAIN NATRIURETIC PEPTIDE: B NATRIURETIC PEPTIDE 5: 522 pg/mL — AB (ref 0.0–100.0)

## 2018-01-28 MED ORDER — FUROSEMIDE 10 MG/ML IJ SOLN
60.0000 mg | Freq: Three times a day (TID) | INTRAMUSCULAR | Status: DC
  Administered 2018-01-28 (×2): 60 mg via INTRAVENOUS
  Filled 2018-01-28 (×3): qty 8

## 2018-01-28 MED ORDER — IPRATROPIUM-ALBUTEROL 0.5-2.5 (3) MG/3ML IN SOLN
3.0000 mL | Freq: Three times a day (TID) | RESPIRATORY_TRACT | Status: DC
  Administered 2018-01-28 – 2018-01-29 (×2): 3 mL via RESPIRATORY_TRACT
  Filled 2018-01-28 (×2): qty 3

## 2018-01-28 MED ORDER — POTASSIUM CHLORIDE CRYS ER 20 MEQ PO TBCR
40.0000 meq | EXTENDED_RELEASE_TABLET | Freq: Once | ORAL | Status: AC
  Administered 2018-01-28: 40 meq via ORAL
  Filled 2018-01-28: qty 2

## 2018-01-28 NOTE — Progress Notes (Signed)
[facility] - [place] at [facility]   PATIENT NAME: [name] [name]    MR#:  [id number]  DATE OF BIRTH:  January 07, 1961  SUBJECTIVE:  CHIEF COMPLAINT:   Chief Complaint  Patient presents with  . Shortness of Breath   Still has shortness of breath, lower extremity edema.  Asking for regular diet since he does not like the bland cardiac diet.  REVIEW OF SYSTEMS:    Review of Systems  Constitutional: Positive for malaise/fatigue. Negative for chills and fever.  HENT: Negative for sore throat.   Eyes: Negative for blurred vision, double vision and pain.  Respiratory: Positive for shortness of breath. Negative for cough, hemoptysis and wheezing.   Cardiovascular: Positive for orthopnea and leg swelling. Negative for chest pain and palpitations.  Gastrointestinal: Negative for abdominal pain, constipation, diarrhea, heartburn, nausea and vomiting.  Genitourinary: Negative for dysuria and hematuria.  Musculoskeletal: Negative for back pain and joint pain.  Skin: Negative for rash.  Neurological: Negative for sensory change, speech change, focal weakness and headaches.  Endo/Heme/Allergies: Does not bruise/bleed easily.  Psychiatric/Behavioral: Negative for depression. The patient is not nervous/anxious.     DRUG ALLERGIES:  No Known Allergies  VITALS:  Blood pressure 117/62, pulse (!) 57, temperature 98.9 F (37.2 C), temperature source Oral, resp. rate 17, height 5\' 8"  (1.727 m), weight (!) 167.8 kg, SpO2 94 %.  PHYSICAL EXAMINATION:   Physical Exam  GENERAL:  57 y.o.-year-old patient lying in the bed .  Morbidly obese EYES: Pupils equal, round, reactive to light and accommodation. No scleral icterus. Extraocular muscles intact.  HEENT: Head atraumatic, normocephalic. Oropharynx and nasopharynx clear.  NECK:  Supple, no jugular venous distention. No thyroid enlargement, no tenderness.  LUNGS: Normal breath sounds bilaterally, no wheezing, rales, rhonchi. No use  of accessory muscles of respiration.  CARDIOVASCULAR: S1, S2 normal. No murmurs, rubs, or gallops.  ABDOMEN: Soft, nontender, nondistended. Bowel sounds present. No organomegaly or mass.  EXTREMITIES: No cyanosis, clubbing .  Bilateral lower extremity edema NEUROLOGIC: Cranial nerves II through XII are intact. No focal Motor or sensory deficits b/l.   PSYCHIATRIC: The patient is alert and oriented x 3.  SKIN: No obvious rash, lesion, or ulcer.   LABORATORY PANEL:   CBC Recent Labs  Lab 01/28/18 0512  WBC 4.8  HGB 10.6*  HCT 35.2*  PLT 220   ------------------------------------------------------------------------------------------------------------------ Chemistries  Recent Labs  Lab 01/27/18 1044 01/28/18 0512  NA 136 134*  K 3.3* 3.8  CL 100 98  CO2 26 28  GLUCOSE 124* 191*  BUN 9 11  CREATININE 0.74 0.88  CALCIUM 8.1* 8.5*  AST 28  --   ALT 20  --   ALKPHOS 58  --   BILITOT 1.1  --    ------------------------------------------------------------------------------------------------------------------  Cardiac Enzymes Recent Labs  Lab 01/27/18 1920  TROPONINI 0.04*   ------------------------------------------------------------------------------------------------------------------  RADIOLOGY:  Dg Chest Port 1 View  Result Date: 01/27/2018 CLINICAL DATA:  Shortness of breath. History of congestive heart failure. EXAM: PORTABLE CHEST 1 VIEW COMPARISON:  01/16/2018 FINDINGS: Chronic cardiomegaly. The lungs are clear. The vascularity is normal. No effusions. No edema. IMPRESSION: no acute cardiopulmonary abnormality. Chronic cardiomegaly. Electronically Signed   By: Paulina Fusi M.D.   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  Will increase IV Lasix dose. - Input and Output - Counseled to limit  fluids and Salt - Monitor  Bun/Cr and Potassium - Echo-reviewed -Cardiology follow up after discharge  *Acute hypoxic respiratory failure.  Off BiPAP today.  Improving.  *COPD exacerbation.  No wheezing today. Nebulizers  *Paroxysmal atrial fibrillation.  On metoprolol and Xarelto.  *Mild elevation in troponin secondary to demand ischemia with CHF  *Sleep apnea.  Uses CPAP at home   All the records are reviewed and case discussed with Care Management/Social Worker Management plans discussed with the patient, family and they are in agreement.  CODE STATUS: FULL CODE  DVT Prophylaxis: SCDs  TOTAL TIME TAKING CARE OF THIS PATIENT: 35 minutes.   POSSIBLE D/C IN 1-2 DAYS, DEPENDING ON CLINICAL CONDITION.  Molinda Bailiff Percival Glasheen M.D on 01/28/2018 at 1:50 PM  Between 7am to 6pm - Pager - [phone]  After 6pm go to [email or web address] - password [email or web address]  [facility]  Office  [phone]  CC: Primary care physician; Patient, No Pcp Per  Note: This dictation was prepared with Dragon dictation along with smaller phrase technology. Any transcriptional errors that result from this process are unintentional.

## 2018-01-29 LAB — BASIC METABOLIC PANEL
Anion gap: 8 (ref 5–15)
BUN: 20 mg/dL (ref 6–20)
CHLORIDE: 98 mmol/L (ref 98–111)
CO2: 30 mmol/L (ref 22–32)
Calcium: 8.6 mg/dL — ABNORMAL LOW (ref 8.9–10.3)
Creatinine, Ser: 0.92 mg/dL (ref 0.61–1.24)
GFR calc non Af Amer: >60 mL/min (ref >60)
Glucose, Bld: 113 mg/dL — ABNORMAL HIGH (ref 70–99)
Potassium: 3.7 mmol/L (ref 3.5–5.1)
Sodium: 136 mmol/L (ref 135–145)

## 2018-01-29 LAB — MAGNESIUM: MAGNESIUM: 1.5 mg/dL — AB (ref 1.7–2.4)

## 2018-01-29 MED ORDER — FUROSEMIDE 80 MG PO TABS: 80.0000 mg | ORAL_TABLET | Freq: Two times a day (BID) | ORAL | 0 refills | Status: DC

## 2018-01-29 MED ORDER — ALBUTEROL SULFATE HFA 108 (90 BASE) MCG/ACT IN AERS: 2.0000 | INHALATION_SPRAY | Freq: Four times a day (QID) | RESPIRATORY_TRACT | 0 refills | Status: DC | PRN

## 2018-01-29 NOTE — Discharge Instructions (Signed)
°-   Daily fluids < 2 liters. °- Low salt diet °- Check weight everyday and keep log. Take to your doctors appt. °- Take extra dose of lasix if you gain more than 3 pounds weight. ° ° °

## 2018-01-29 NOTE — Progress Notes (Signed)
Patient is being discharged to home this morning. IV removed. DC & Rx instructions given and patient had no questions. Belongings packed and waiting for ride home.

## 2018-02-02 ENCOUNTER — Encounter: Payer: Self-pay | Admitting: Family

## 2018-02-02 ENCOUNTER — Ambulatory Visit: Payer: Medicaid - Out of State | Attending: Family | Admitting: Family

## 2018-02-02 DIAGNOSIS — J449 Chronic obstructive pulmonary disease, unspecified: Secondary | ICD-10-CM | POA: Diagnosis not present

## 2018-02-02 DIAGNOSIS — Z79899 Other long term (current) drug therapy: Secondary | ICD-10-CM | POA: Diagnosis not present

## 2018-02-02 DIAGNOSIS — J441 Chronic obstructive pulmonary disease with (acute) exacerbation: Secondary | ICD-10-CM | POA: Diagnosis present

## 2018-02-02 DIAGNOSIS — G4733 Obstructive sleep apnea (adult) (pediatric): Secondary | ICD-10-CM | POA: Diagnosis not present

## 2018-02-02 DIAGNOSIS — I4891 Unspecified atrial fibrillation: Secondary | ICD-10-CM | POA: Insufficient documentation

## 2018-02-02 DIAGNOSIS — Z7982 Long term (current) use of aspirin: Secondary | ICD-10-CM | POA: Insufficient documentation

## 2018-02-02 DIAGNOSIS — I5022 Chronic systolic (congestive) heart failure: Secondary | ICD-10-CM | POA: Insufficient documentation

## 2018-02-02 DIAGNOSIS — I5042 Chronic combined systolic (congestive) and diastolic (congestive) heart failure: Secondary | ICD-10-CM | POA: Insufficient documentation

## 2018-02-02 DIAGNOSIS — M549 Dorsalgia, unspecified: Secondary | ICD-10-CM | POA: Insufficient documentation

## 2018-02-02 DIAGNOSIS — G8929 Other chronic pain: Secondary | ICD-10-CM | POA: Insufficient documentation

## 2018-02-02 DIAGNOSIS — Z8249 Family history of ischemic heart disease and other diseases of the circulatory system: Secondary | ICD-10-CM | POA: Insufficient documentation

## 2018-02-02 DIAGNOSIS — R0602 Shortness of breath: Secondary | ICD-10-CM | POA: Diagnosis present

## 2018-02-02 DIAGNOSIS — I5032 Chronic diastolic (congestive) heart failure: Secondary | ICD-10-CM | POA: Insufficient documentation

## 2018-02-02 DIAGNOSIS — Z7901 Long term (current) use of anticoagulants: Secondary | ICD-10-CM | POA: Diagnosis not present

## 2018-02-02 DIAGNOSIS — J432 Centrilobular emphysema: Secondary | ICD-10-CM | POA: Insufficient documentation

## 2018-02-02 DIAGNOSIS — I890 Lymphedema, not elsewhere classified: Secondary | ICD-10-CM | POA: Insufficient documentation

## 2018-02-02 DIAGNOSIS — J410 Simple chronic bronchitis: Secondary | ICD-10-CM

## 2018-02-02 DIAGNOSIS — G4700 Insomnia, unspecified: Secondary | ICD-10-CM | POA: Diagnosis not present

## 2018-02-02 DIAGNOSIS — I5033 Acute on chronic diastolic (congestive) heart failure: Secondary | ICD-10-CM | POA: Insufficient documentation

## 2018-02-02 MED ORDER — SACUBITRIL-VALSARTAN 24-26 MG PO TABS: 1.0000 | ORAL_TABLET | Freq: Two times a day (BID) | ORAL | 3 refills | Status: DC

## 2018-02-02 NOTE — Patient Instructions (Addendum)
Begin weighing daily and call for an overnight weight gain of > 2 pounds or a weekly weight gain of >5 pounds.  Finish taking lisinopril and then skip one day and then begin taking entresto 24/26mg  twice daily.    [facility] [address] [place] [address] [phone]  [facility] for appointments:   Internal Medicine- [phone] Cardiology- [phone] Pulmonology- [phone]

## 2018-02-02 NOTE — Progress Notes (Signed)
Patient ID: [name] [name], male    DOB: [birth year], 57 y.o.   MRN: [id number]  HPI  [name] [name] is a 57 y/o male with a history of asthma, COPD, atrial fibrillation, obstructive sleep apnea and chronic heart failure.   Echo report from 01/17/18 reviewed and showed an EF of 30%.  Admitted 01/27/18 due to acute on chronic HF. Initially given IV lasix and then transitioned to oral diuretics. Also needed bipap initially and then was transitioned off. Troponin elevation thought to be due to demand ischemia. Discharged after 2 days. Admitted 01/16/18 due to HF/COPD exacerbation. Initially required bipap and then transitioned to room air. Had recently moved from [facility] and had been without some of his medications. Discharged after 3 days.   He presents today for his initial visit with a chief complaint of moderate shortness of breath upon minimal exertion. He describes this as chronic having been present for several years. He has associated fatigue, dizziness, chest pain, pedal edema, palpitations, chronic pain and difficulty sleeping along with this. He denies any abdominal distention or cough. Does not have scales to weigh himself.   Past Medical History:  Diagnosis Date  . A-fib (HCC)   . Asthma   . CHF (congestive heart failure) (HCC)   . COPD (chronic obstructive pulmonary disease) (HCC)   . Sleep apnea    Past Surgical History:  Procedure Laterality Date  . hearnia repair    . LEG SURGERY     Family History  Problem Relation Age of Onset  . Heart failure Mother   . Lung cancer Mother   . Lung cancer Father    Social History   Tobacco Use  . Smoking status: Never Smoker  . Smokeless tobacco: Never Used  Substance Use Topics  . Alcohol use: Never    Frequency: Never   No Known Allergies Prior to Admission medications   Medication Sig Start Date End Date Taking? Authorizing Provider  albuterol (PROVENTIL HFA;VENTOLIN HFA) 108 (90 Base) MCG/ACT inhaler Inhale 2 puffs into the lungs  every 6 (six) hours as needed for wheezing or shortness of breath. 01/29/18  Yes Milagros Loll, MD  aspirin 325 MG tablet Take 325 mg by mouth daily.   Yes [provider]  ciprofloxacin (CILOXAN) 0.3 % ophthalmic solution Place 1 drop into both eyes every 2 (two) hours while awake. Administer 1 drop, every 2 hours, while awake, for 2 days. Then 1 drop, every 4 hours, while awake, for the next 5 days. 01/19/18  Yes Auburn Bilberry, MD  furosemide (LASIX) 80 MG tablet Take 1 tablet (80 mg total) by mouth 2 (two) times daily. 01/29/18 02/28/18 Yes Sudini, Wardell Heath, MD  lisinopril (PRINIVIL,ZESTRIL) 2.5 MG tablet Take 1 tablet (2.5 mg total) by mouth daily. 01/18/18  Yes Auburn Bilberry, MD  metoprolol tartrate (LOPRESSOR) 50 MG tablet Take 2 tablets (100 mg total) by mouth 2 (two) times daily. 01/19/18  Yes Auburn Bilberry, MD  nitroGLYCERIN (NITROSTAT) 0.4 MG SL tablet Place 1 tablet (0.4 mg total) under the tongue every 5 (five) minutes as needed for chest pain. 01/18/18  Yes Auburn Bilberry, MD  rivaroxaban (XARELTO) 20 MG TABS tablet Take 1 tablet (20 mg total) by mouth daily. 01/18/18  Yes Auburn Bilberry, MD  traZODone (DESYREL) 150 MG tablet Take 1 tablet (150 mg total) by mouth at bedtime as needed for sleep. 01/18/18  Yes Auburn Bilberry, MD    Review of Systems  Constitutional: Positive for fatigue (with minimal exertion).  HENT:  Positive for rhinorrhea. Negative for congestion and sore throat.   Eyes: Negative.   Respiratory: Positive for shortness of breath (with minimal exertion).   Cardiovascular: Positive for chest pain (under left breast), palpitations and leg swelling.  Gastrointestinal: Negative for abdominal distention and abdominal pain.  Endocrine: Negative.   Genitourinary: Negative.   Musculoskeletal: Positive for arthralgias (knees/ hip/ right shoulder) and back pain (chronic).  Skin: Positive for wound (left lower leg).  Allergic/Immunologic: Negative.   Neurological:  Positive for dizziness. Negative for light-headedness.  Hematological: Negative for adenopathy. Does not bruise/bleed easily.  Psychiatric/Behavioral: Positive for sleep disturbance (wearing CPAP at night). Negative for dysphoric mood. The patient is not nervous/anxious.    Vitals:   02/02/18 1152  BP: 128/78  Pulse: 73  Resp: 18  SpO2: 96%  Weight: (!) 389 lb 6 oz (176.6 kg)  Height: 5\' 7"  (1.702 m)   Wt Readings from Last 3 Encounters:  02/02/18 (!) 389 lb 6 oz (176.6 kg)  01/27/18 (!) 369 lb 14.9 oz (167.8 kg)  01/16/18 (!) 369 lb 14.9 oz (167.8 kg)   Lab Results  Component Value Date   CREATININE 0.92 01/29/2018   CREATININE 0.88 01/28/2018   CREATININE 0.74 01/27/2018    Physical Exam Vitals signs and nursing note reviewed.  Constitutional:      Appearance: He is well-developed.  HENT:     Head: Normocephalic and atraumatic.  Neck:     Musculoskeletal: Normal range of motion and neck supple.     Vascular: No JVD.  Cardiovascular:     Rate and Rhythm: Normal rate and regular rhythm.  Pulmonary:     Effort: Pulmonary effort is normal.     Breath sounds: Normal breath sounds. No wheezing or rales.  Abdominal:     Palpations: Abdomen is soft.     Tenderness: There is no abdominal tenderness.  Musculoskeletal:     Right lower leg: He exhibits no tenderness. Edema (2+ pitting) present.     Left lower leg: He exhibits no tenderness. Edema (2+ pitting) present.  Skin:    General: Skin is warm and dry.  Neurological:     General: No focal deficit present.     Mental Status: He is alert and oriented to person, place, and time.  Psychiatric:        Mood and Affect: Mood normal.        Behavior: Behavior normal.    Assessment & Plan:  1: Chronic heart failure with reduced ejection fraction- - NYHA class III - euvolemic today - not weighing daily as he doesn't have any scales. Set of scales given to him and he was instructed to weigh himself every morning, write  the weight down and call for an overnight weight gain of >2 pounds or a weekly weight gain of >5 pounds - not adding salt and has recently started reading food labels. Reviewed the importance of closely following a 2000mg  sodium diet and written dietary information was given to him about this.  - says that he drinks "a lot" of milk  - advised him to finish out his lisinopril, skip one day and then begin entresto 24/26mg  twice daily. Voucher given for him to get the first 30 days free of the medication - BMP to be checked at his next visit - has not gotten established with cardiology yet and prefers [facility] so that he can have cardiology, pulmonology and PCP in the same building - BMP 01/29/18 reviewed and showed sodium  136, potassium 3.7, creatinine 0.92 and GFR >60 - BNP 01/28/18 was 522.0 - does not get a flu vaccine; good handwashing encouraged  2: COPD- - lungs clear today [facility] pulmonology contact information given to patient  3: Obstructive sleep apnea- - wearing CPAP nightly - sleeping well other than he goes through periods of insomnia  4: Lymphedema- - stage 2 - does wear compression socks on occasion; encouraged him to wear them daily with removal at bedtime - elevates his legs "some"; instructed to elevate his legs when sitting for long periods of time - limited in his ability to exercise due to chronic back pain - discussed lymphapress compression boots if edema persists  Patient did not bring his medications nor a list. Each medication was verbally reviewed with the patient and he was encouraged to bring the bottles to every visit to confirm accuracy of list.  Patient was instructed that he needed to go to DSS and get his medicaid transferred from [place] to [place].   Return in 6 weeks or sooner for any questions/problems before then.

## 2018-02-09 NOTE — Discharge Summary (Signed)
[facility] [place] [facility]   PATIENT NAME: [name] [name]    MR#:  [id number]  DATE OF BIRTH:  23-Jan-1961  DATE OF ADMISSION:  01/27/2018 ADMITTING PHYSICIAN: Alford Highland, MD  DATE OF DISCHARGE: 01/29/2018 10:45 AM  PRIMARY CARE PHYSICIAN: Patient, No Pcp Per   ADMISSION DIAGNOSIS:  SOB (shortness of breath) [R06.02] Dyspnea, unspecified type [R06.00]  DISCHARGE DIAGNOSIS:  Active Problems:   Acute respiratory failure with hypoxia (HCC)   Dyspnea   SOB (shortness of breath)   SECONDARY DIAGNOSIS:   Past Medical History:  Diagnosis Date  . A-fib (HCC)   . Asthma   . CHF (congestive heart failure) (HCC)   . COPD (chronic obstructive pulmonary disease) (HCC)   . Sleep apnea      ADMITTING HISTORY  HISTORY OF PRESENT ILLNESS:  [name] [name]  is a 57 y.o. male with a known history of CHF and asthma and COPD and sleep apnea presents with shortness of breath.  He recently moved from [place] and was in the hospital and recently discharged for CHF exacerbation and rapid atrial fibrillation.  He complains of shortness of breath starting all of a sudden this morning.  He does have some left chest pain.  Also having some pain in his back and some dark urine.  Not much cough.  He states he has had a little fever and chills at home and feeling very fatigued.  Some runny nose and eye watering.  In the ER he was placed on BiPAP for respiratory distress.  Hospitalist services were contacted for further evaluation.  Patient states he does not walk around very much   HOSPITAL COURSE:   *Acute on chronic systolic congestive heart failure with cardiomyopathy Echocardiogram reviewed.  Patient was admitted to stepdown unit initially on BiPAP and later moved to medical floor with telemetry monitoring.  He diuresed well being -7 L in the hospital with IV Lasix.  Patient mentioned that since he was switched to Bumex from Lasix he has not had good urine output.  He  will be discharged home on Lasix twice daily.  Potassium supplemented.  Patient feels significantly better.  He continues to have some more fluid overload and this needs to be diuresed slowly with outpatient follow-up.  He has follow-up at the CHF clinic.  Counseled extensively regarding fluid and salt restriction which are likely the reason he has decompensated due to being noncompliant.  *Acute hypoxic respiratory failure.  Off BiPAP today.  Hypoxic respiratory failure has resolved.  Discharged home on room air.  *COPD exacerbation.  No wheezing today. Was given steroids initially which were discontinued.  No further wheezing.  *Paroxysmal atrial fibrillation.  On metoprolol and Xarelto.  *Mild elevation in troponin secondary to demand ischemia with CHF  *Sleep apnea.  Uses CPAP at home  Patient stable for discharge home to follow-up at the CHF clinic.  CHF instructions given.  CONSULTS OBTAINED:    DRUG ALLERGIES:  No Known Allergies  DISCHARGE MEDICATIONS:   Allergies as of 01/29/2018   No Known Allergies     Medication List    STOP taking these medications   bumetanide 2 MG tablet Commonly known as:  BUMEX     TAKE these medications   albuterol 108 (90 Base) MCG/ACT inhaler Commonly known as:  PROVENTIL HFA;VENTOLIN HFA Inhale 2 puffs into the lungs every 6 (six) hours as needed for wheezing or shortness of breath.   aspirin 325 MG tablet Take 325 mg by  mouth daily.   ciprofloxacin 0.3 % ophthalmic solution Commonly known as:  CILOXAN Place 1 drop into both eyes every 2 (two) hours while awake. Administer 1 drop, every 2 hours, while awake, for 2 days. Then 1 drop, every 4 hours, while awake, for the next 5 days.   furosemide 80 MG tablet Commonly known as:  LASIX Take 1 tablet (80 mg total) by mouth 2 (two) times daily. What changed:    medication strength  how much to take  when to take this   metoprolol tartrate 50 MG tablet Commonly known as:   LOPRESSOR Take 2 tablets (100 mg total) by mouth 2 (two) times daily.   nitroGLYCERIN 0.4 MG SL tablet Commonly known as:  NITROSTAT Place 1 tablet (0.4 mg total) under the tongue every 5 (five) minutes as needed for chest pain.   rivaroxaban 20 MG Tabs tablet Commonly known as:  XARELTO Take 1 tablet (20 mg total) by mouth daily.   traZODone 150 MG tablet Commonly known as:  DESYREL Take 1 tablet (150 mg total) by mouth at bedtime as needed for sleep.       Today   VITAL SIGNS:  Blood pressure 129/78, pulse 100, temperature 98.2 F (36.8 C), temperature source Oral, resp. rate 19, height 5\' 8"  (1.727 m), weight (!) 167.8 kg, SpO2 97 %.  I/O:  No intake or output data in the 24 hours ending 02/09/18 1649  PHYSICAL EXAMINATION:  Physical Exam  GENERAL:  57 y.o.-year-old patient lying in the bed with no acute distress.  LUNGS: Normal breath sounds bilaterally, no wheezing, rales,rhonchi or crepitation. No use of accessory muscles of respiration.  CARDIOVASCULAR: S1, S2 normal. No murmurs, rubs, or gallops.  ABDOMEN: Soft, non-tender, non-distended. Bowel sounds present. No organomegaly or mass.  NEUROLOGIC: Moves all 4 extremities. PSYCHIATRIC: The patient is alert and oriented x 3.  SKIN: No obvious rash, lesion, or ulcer.   DATA REVIEW:   CBC No results for input(s): WBC, HGB, HCT, PLT in the last 168 hours.  Chemistries  No results for input(s): NA, K, CL, CO2, GLUCOSE, BUN, CREATININE, CALCIUM, MG, AST, ALT, ALKPHOS, BILITOT in the last 168 hours.  Invalid input(s): GFRCGP  Cardiac Enzymes No results for input(s): TROPONINI in the last 168 hours.  Microbiology Results  Results for orders placed or performed during the hospital encounter of 01/16/18  Culture, blood (routine x 2)     Status: None   Collection Time: 01/16/18 11:09 AM  Result Value Ref Range Status   Specimen Description BLOOD RIGHT ANTECUBITAL  Final   Special Requests   Final    BOTTLES DRAWN  AEROBIC AND ANAEROBIC Blood Culture adequate volume   Culture   Final    NO GROWTH 5 DAYS Performed at [facility], [address] [place], [place] [address]    Report Status 01/21/2018 FINAL  Final  Culture, blood (routine x 2)     Status: None   Collection Time: 01/16/18 11:09 AM  Result Value Ref Range Status   Specimen Description BLOOD RIGHT HAND  Final   Special Requests   Final    BOTTLES DRAWN AEROBIC AND ANAEROBIC Blood Culture adequate volume   Culture   Final    NO GROWTH 5 DAYS Performed at [facility], [address] [place], [place] [address]    Report Status 01/21/2018 FINAL  Final  MRSA PCR Screening     Status: None   Collection Time: 01/16/18  2:43 PM  Result Value Ref Range Status   MRSA by PCR NEGATIVE NEGATIVE Final    Comment:        The GeneXpert MRSA Assay (FDA approved for NASAL specimens only), is one component of a comprehensive MRSA colonization surveillance program. It is not intended to diagnose MRSA infection nor to guide or monitor treatment for MRSA infections. Performed at [facility], [address] [place], [place] [address]     RADIOLOGY:  No results found.  Follow up with PCP in 1 week.  Management plans discussed with the patient, family and they are in agreement.  CODE STATUS:  Code Status History    Date Active Date Inactive Code Status Order ID Comments User Context   01/27/2018 1345 01/29/2018 1406 Full Code [id number]  Alford HighlandWieting, Richard, MD ED   01/17/2018 1330 01/19/2018 1818 Partial Code [id number]  Milagros LollSudini, Jenelle Drennon, MD Inpatient   01/16/2018 1427 01/17/2018 1330 Full Code [id number]  Auburn BilberryPatel, Shreyang, MD ED      TOTAL TIME TAKING CARE OF THIS PATIENT ON DAY OF DISCHARGE: more than 30 minutes.   Molinda BailiffSrikar R Ben Habermann M.D on 02/09/2018 at 4:49 PM  Between 7am to 6pm - Pager - [phone]  After 6pm go to [email or web address] - password [email or web address]  [facility]  Office   [phone]  CC: Primary care physician; Patient, No Pcp Per  Note: This dictation was prepared with Dragon dictation along with smaller phrase technology. Any transcriptional errors that result from this process are unintentional.

## 2018-02-23 ENCOUNTER — Emergency Department: Payer: Medicaid Other

## 2018-02-23 ENCOUNTER — Inpatient Hospital Stay
Admission: EM | Admit: 2018-02-23 | Discharge: 2018-02-27 | DRG: 291 | Disposition: A | Discharge status: Home / Self Care | Payer: Medicaid Other | Attending: Internal Medicine | Admitting: Internal Medicine

## 2018-02-23 ENCOUNTER — Other Ambulatory Visit: Payer: Self-pay

## 2018-02-23 DIAGNOSIS — R0902 Hypoxemia: Secondary | ICD-10-CM | POA: Diagnosis not present

## 2018-02-23 DIAGNOSIS — I248 Other forms of acute ischemic heart disease: Secondary | ICD-10-CM | POA: Diagnosis present

## 2018-02-23 DIAGNOSIS — I509 Heart failure, unspecified: Secondary | ICD-10-CM

## 2018-02-23 DIAGNOSIS — G4733 Obstructive sleep apnea (adult) (pediatric): Secondary | ICD-10-CM | POA: Diagnosis present

## 2018-02-23 DIAGNOSIS — J9621 Acute and chronic respiratory failure with hypoxia: Secondary | ICD-10-CM | POA: Diagnosis present

## 2018-02-23 DIAGNOSIS — I447 Left bundle-branch block, unspecified: Secondary | ICD-10-CM | POA: Diagnosis present

## 2018-02-23 DIAGNOSIS — Z7982 Long term (current) use of aspirin: Secondary | ICD-10-CM | POA: Diagnosis not present

## 2018-02-23 DIAGNOSIS — J9601 Acute respiratory failure with hypoxia: Secondary | ICD-10-CM | POA: Diagnosis not present

## 2018-02-23 DIAGNOSIS — J441 Chronic obstructive pulmonary disease with (acute) exacerbation: Secondary | ICD-10-CM | POA: Diagnosis not present

## 2018-02-23 DIAGNOSIS — I4819 Other persistent atrial fibrillation: Secondary | ICD-10-CM | POA: Diagnosis present

## 2018-02-23 DIAGNOSIS — E876 Hypokalemia: Secondary | ICD-10-CM | POA: Diagnosis present

## 2018-02-23 DIAGNOSIS — J449 Chronic obstructive pulmonary disease, unspecified: Secondary | ICD-10-CM | POA: Diagnosis present

## 2018-02-23 DIAGNOSIS — E662 Morbid (severe) obesity with alveolar hypoventilation: Secondary | ICD-10-CM | POA: Diagnosis present

## 2018-02-23 DIAGNOSIS — I5042 Chronic combined systolic (congestive) and diastolic (congestive) heart failure: Secondary | ICD-10-CM | POA: Diagnosis present

## 2018-02-23 DIAGNOSIS — D649 Anemia, unspecified: Secondary | ICD-10-CM | POA: Diagnosis present

## 2018-02-23 DIAGNOSIS — Z7901 Long term (current) use of anticoagulants: Secondary | ICD-10-CM

## 2018-02-23 DIAGNOSIS — R079 Chest pain, unspecified: Secondary | ICD-10-CM | POA: Insufficient documentation

## 2018-02-23 DIAGNOSIS — Z79899 Other long term (current) drug therapy: Secondary | ICD-10-CM | POA: Diagnosis not present

## 2018-02-23 DIAGNOSIS — Z136 Encounter for screening for cardiovascular disorders: Secondary | ICD-10-CM | POA: Diagnosis not present

## 2018-02-23 DIAGNOSIS — R0602 Shortness of breath: Secondary | ICD-10-CM

## 2018-02-23 DIAGNOSIS — Z9111 Patient's noncompliance with dietary regimen: Secondary | ICD-10-CM

## 2018-02-23 DIAGNOSIS — Z8249 Family history of ischemic heart disease and other diseases of the circulatory system: Secondary | ICD-10-CM

## 2018-02-23 DIAGNOSIS — R0789 Other chest pain: Secondary | ICD-10-CM | POA: Diagnosis not present

## 2018-02-23 DIAGNOSIS — Z801 Family history of malignant neoplasm of trachea, bronchus and lung: Secondary | ICD-10-CM

## 2018-02-23 DIAGNOSIS — Z6841 Body Mass Index (BMI) 40.0 and over, adult: Secondary | ICD-10-CM

## 2018-02-23 DIAGNOSIS — I5023 Acute on chronic systolic (congestive) heart failure: Secondary | ICD-10-CM | POA: Diagnosis not present

## 2018-02-23 DIAGNOSIS — J9600 Acute respiratory failure, unspecified whether with hypoxia or hypercapnia: Secondary | ICD-10-CM | POA: Diagnosis not present

## 2018-02-23 DIAGNOSIS — J432 Centrilobular emphysema: Secondary | ICD-10-CM | POA: Diagnosis present

## 2018-02-23 DIAGNOSIS — R000 Tachycardia, unspecified: Secondary | ICD-10-CM | POA: Diagnosis not present

## 2018-02-23 DIAGNOSIS — R0689 Other abnormalities of breathing: Secondary | ICD-10-CM | POA: Diagnosis not present

## 2018-02-23 DIAGNOSIS — I5033 Acute on chronic diastolic (congestive) heart failure: Secondary | ICD-10-CM | POA: Diagnosis present

## 2018-02-23 DIAGNOSIS — I5022 Chronic systolic (congestive) heart failure: Secondary | ICD-10-CM | POA: Diagnosis present

## 2018-02-23 LAB — CBC WITH DIFFERENTIAL/PLATELET
Abs Immature Granulocytes: 0.02 10*3/uL (ref 0.00–0.07)
Basophils Absolute: 0 10*3/uL (ref 0.0–0.1)
Basophils Relative: 1 %
Eosinophils Absolute: 0 10*3/uL (ref 0.0–0.5)
Eosinophils Relative: 0 %
HCT: 34.1 % — ABNORMAL LOW (ref 39.0–52.0)
Hemoglobin: 10.4 g/dL — ABNORMAL LOW (ref 13.0–17.0)
Immature Granulocytes: 0 %
Lymphocytes Relative: 9 %
Lymphs Abs: 0.6 10*3/uL — ABNORMAL LOW (ref 0.7–4.0)
MCH: 24.8 pg — ABNORMAL LOW (ref 26.0–34.0)
MCHC: 30.5 g/dL (ref 30.0–36.0)
MCV: 81.2 fL (ref 80.0–100.0)
Monocytes Absolute: 0.5 10*3/uL (ref 0.1–1.0)
Monocytes Relative: 8 %
Neutro Abs: 5.7 10*3/uL (ref 1.7–7.7)
Neutrophils Relative %: 82 %
Platelets: 154 10*3/uL (ref 150–400)
RBC: 4.2 MIL/uL — ABNORMAL LOW (ref 4.22–5.81)
RDW: 19 % — AB (ref 11.5–15.5)
WBC: 6.9 10*3/uL (ref 4.0–10.5)
nRBC: 0 % (ref 0.0–0.2)

## 2018-02-23 LAB — BASIC METABOLIC PANEL
Anion gap: 14 (ref 5–15)
BUN: 7 mg/dL (ref 6–20)
CO2: 21 mmol/L — ABNORMAL LOW (ref 22–32)
CREATININE: 0.78 mg/dL (ref 0.61–1.24)
Calcium: 7.7 mg/dL — ABNORMAL LOW (ref 8.9–10.3)
Chloride: 100 mmol/L (ref 98–111)
GFR calc Af Amer: >60 mL/min (ref >60)
GFR calc non Af Amer: >60 mL/min (ref >60)
Glucose, Bld: 136 mg/dL — ABNORMAL HIGH (ref 70–99)
Potassium: 3.3 mmol/L — ABNORMAL LOW (ref 3.5–5.1)
Sodium: 135 mmol/L (ref 135–145)

## 2018-02-23 LAB — TROPONIN I
Troponin I: 0.04 ng/mL (ref <0.03)
Troponin I: 0.04 ng/mL (ref <0.03)

## 2018-02-23 LAB — INFLUENZA PANEL BY PCR (TYPE A & B)
Influenza A By PCR: NEGATIVE
Influenza B By PCR: NEGATIVE

## 2018-02-23 LAB — MAGNESIUM: MAGNESIUM: 1.4 mg/dL — AB (ref 1.7–2.4)

## 2018-02-23 LAB — GLUCOSE, CAPILLARY: Glucose-Capillary: 118 mg/dL — ABNORMAL HIGH (ref 70–99)

## 2018-02-23 LAB — BRAIN NATRIURETIC PEPTIDE: B Natriuretic Peptide: 270 pg/mL — ABNORMAL HIGH (ref 0.0–100.0)

## 2018-02-23 MED ORDER — MORPHINE SULFATE (PF) 2 MG/ML IV SOLN
1.0000 mg | INTRAVENOUS | Status: DC | PRN
  Administered 2018-02-24: 1 mg via INTRAVENOUS
  Administered 2018-02-24: 2 mg via INTRAVENOUS
  Administered 2018-02-24: 1 mg via INTRAVENOUS
  Administered 2018-02-25 – 2018-02-27 (×6): 2 mg via INTRAVENOUS
  Filled 2018-02-23 (×11): qty 1

## 2018-02-23 MED ORDER — POTASSIUM CHLORIDE CRYS ER 20 MEQ PO TBCR
40.0000 meq | EXTENDED_RELEASE_TABLET | Freq: Once | ORAL | Status: DC
  Filled 2018-02-23: qty 2

## 2018-02-23 MED ORDER — PROMETHAZINE HCL 25 MG/ML IJ SOLN: 12.5000 mg | Freq: Four times a day (QID) | INTRAMUSCULAR | Status: DC | PRN

## 2018-02-23 MED ORDER — LEVALBUTEROL HCL 1.25 MG/0.5ML IN NEBU
1.2500 mg | INHALATION_SOLUTION | Freq: Four times a day (QID) | RESPIRATORY_TRACT | Status: DC | PRN
  Administered 2018-02-25 – 2018-02-26 (×3): 1.25 mg via RESPIRATORY_TRACT
  Filled 2018-02-23 (×3): qty 0.5

## 2018-02-23 MED ORDER — GUAIFENESIN-DM 100-10 MG/5ML PO SYRP
5.0000 mL | ORAL_SOLUTION | ORAL | Status: DC | PRN
  Filled 2018-02-23: qty 5

## 2018-02-23 MED ORDER — MORPHINE SULFATE (PF) 2 MG/ML IV SOLN
1.0000 mg | Freq: Once | INTRAVENOUS | Status: AC
  Administered 2018-02-23: 1 mg via INTRAVENOUS

## 2018-02-23 MED ORDER — BUDESONIDE 0.25 MG/2ML IN SUSP
0.2500 mg | Freq: Two times a day (BID) | RESPIRATORY_TRACT | Status: DC
  Administered 2018-02-24 – 2018-02-27 (×8): 0.25 mg via RESPIRATORY_TRACT
  Filled 2018-02-23 (×8): qty 2

## 2018-02-23 MED ORDER — ONDANSETRON HCL 4 MG/2ML IJ SOLN
4.0000 mg | Freq: Four times a day (QID) | INTRAMUSCULAR | Status: DC | PRN
  Administered 2018-02-23: 4 mg via INTRAVENOUS
  Filled 2018-02-23: qty 2

## 2018-02-23 MED ORDER — FUROSEMIDE 10 MG/ML IJ SOLN
80.0000 mg | Freq: Once | INTRAMUSCULAR | Status: AC
  Administered 2018-02-23: 80 mg via INTRAVENOUS
  Filled 2018-02-23: qty 8

## 2018-02-23 MED ORDER — ONDANSETRON HCL 4 MG/2ML IJ SOLN
4.0000 mg | Freq: Once | INTRAMUSCULAR | Status: AC
  Administered 2018-02-23: 4 mg via INTRAVENOUS

## 2018-02-23 MED ORDER — ONDANSETRON HCL 4 MG/2ML IJ SOLN
INTRAMUSCULAR | Status: AC
  Administered 2018-02-23: 4 mg via INTRAVENOUS
  Filled 2018-02-23: qty 2

## 2018-02-23 MED ORDER — METHYLPREDNISOLONE SODIUM SUCC 125 MG IJ SOLR
60.0000 mg | Freq: Four times a day (QID) | INTRAMUSCULAR | Status: DC
  Administered 2018-02-23 – 2018-02-25 (×6): 60 mg via INTRAVENOUS
  Filled 2018-02-23 (×6): qty 2

## 2018-02-23 MED ORDER — AZITHROMYCIN 500 MG PO TABS
500.0000 mg | ORAL_TABLET | Freq: Every day | ORAL | Status: DC
  Filled 2018-02-23: qty 1

## 2018-02-23 MED ORDER — BENZONATATE 100 MG PO CAPS: 200.0000 mg | ORAL_CAPSULE | Freq: Three times a day (TID) | ORAL | Status: DC | PRN

## 2018-02-23 MED ORDER — MORPHINE SULFATE (PF) 2 MG/ML IV SOLN
INTRAVENOUS | Status: AC
  Filled 2018-02-23: qty 1

## 2018-02-23 MED ORDER — POTASSIUM CHLORIDE CRYS ER 20 MEQ PO TBCR
20.0000 meq | EXTENDED_RELEASE_TABLET | Freq: Two times a day (BID) | ORAL | Status: DC
  Administered 2018-02-24: 20 meq via ORAL
  Filled 2018-02-23: qty 1

## 2018-02-23 MED ORDER — ALPRAZOLAM 0.5 MG PO TABS: 0.5000 mg | ORAL_TABLET | Freq: Two times a day (BID) | ORAL | Status: DC | PRN

## 2018-02-23 NOTE — ED Triage Notes (Signed)
Pt arrives via [facility] from home with c/o SOB and left sided chest pain that radiates to left arm. Pt's O2 in 80s on his home CPAP. EMS placed pt on nonrebreather and O2 up to 99%.  Pt has labored breathing with accessory muscle usage.

## 2018-02-23 NOTE — ED Notes (Signed)
Walked past pt's room and he had peed all over the floor. RN advised patient to use urinal and call out if he needed help because his call bell is right on bed rail next to him. Pt states he can't use urinal and uses a specimen cup. Got patient urinal and another container and pt had peed in a emesis bag. Advised patient to please and try not pee on the floor. Chuxs applied to floor over urine at this time.

## 2018-02-23 NOTE — ED Notes (Signed)
Resp therapy called to accompany RN with pt up to ICU.

## 2018-02-23 NOTE — ED Notes (Signed)
2 unsuccessful US-guided PIV attempts by this RN. Charge RN, Wynona Canes, primary RN, made aware.

## 2018-02-23 NOTE — ED Notes (Signed)
Pt took off BiPAP mask. Educated his oxygen has plummeted and needs to put it back on. Pt at baseline tremor per pt.

## 2018-02-23 NOTE — ED Provider Notes (Signed)
[facility] Emergency Department Provider Note  ____________________________________________   I have reviewed the triage vital signs and the nursing notes.   HISTORY  Chief Complaint Chest Pain and Shortness of Breath   History limited by: Not Limited   HPI [name] [name] is a 58 y.o. male who presents to the emergency department today because of concerns for chest pain and shortness of breath.  He states his symptoms started today.  He describes the chest pain as being located in the center and left chest.  Describes it as a pressure type pain.  This was accompanied by shortness of breath.  Patient states that he has had similar symptoms.  He was recently in the hospital with similar symptoms.  He states he was told he had too much fluid on him at that time.  Patient denies any fevers.   Per medical record review patient has a history of atrial fibrillation, CHF, recent admission for CHF and shortness of breath.  Past Medical History:  Diagnosis Date  . A-fib (HCC)   . Asthma   . CHF (congestive heart failure) (HCC)   . COPD (chronic obstructive pulmonary disease) (HCC)   . Sleep apnea     Patient Active Problem List   Diagnosis Date Noted  . Chronic systolic heart failure (HCC) 02/02/2018  . COPD (chronic obstructive pulmonary disease) (HCC) 02/02/2018  . Obstructive sleep apnea 02/02/2018  . Lymphedema 02/02/2018  . Acute respiratory failure with hypoxia (HCC) 01/27/2018  . Dyspnea   . SOB (shortness of breath)   . Acute respiratory failure (HCC) 01/16/2018    Past Surgical History:  Procedure Laterality Date  . hearnia repair    . LEG SURGERY      Prior to Admission medications   Medication Sig Start Date End Date Taking? Authorizing Provider  albuterol (PROVENTIL HFA;VENTOLIN HFA) 108 (90 Base) MCG/ACT inhaler Inhale 2 puffs into the lungs every 6 (six) hours as needed for wheezing or shortness of breath. 01/29/18   Milagros Loll, MD   aspirin 325 MG tablet Take 325 mg by mouth daily.    [provider]  ciprofloxacin (CILOXAN) 0.3 % ophthalmic solution Place 1 drop into both eyes every 2 (two) hours while awake. Administer 1 drop, every 2 hours, while awake, for 2 days. Then 1 drop, every 4 hours, while awake, for the next 5 days. 01/19/18   Auburn Bilberry, MD  furosemide (LASIX) 80 MG tablet Take 1 tablet (80 mg total) by mouth 2 (two) times daily. 01/29/18 02/28/18  Milagros Loll, MD  metoprolol tartrate (LOPRESSOR) 50 MG tablet Take 2 tablets (100 mg total) by mouth 2 (two) times daily. 01/19/18   Auburn Bilberry, MD  nitroGLYCERIN (NITROSTAT) 0.4 MG SL tablet Place 1 tablet (0.4 mg total) under the tongue every 5 (five) minutes as needed for chest pain. 01/18/18   Auburn Bilberry, MD  rivaroxaban (XARELTO) 20 MG TABS tablet Take 1 tablet (20 mg total) by mouth daily. 01/18/18   Auburn Bilberry, MD  sacubitril-valsartan (ENTRESTO) 24-26 MG Take 1 tablet by mouth 2 (two) times daily. D/C lisinopril 02/02/18   Delma Freeze, FNP  traZODone (DESYREL) 150 MG tablet Take 1 tablet (150 mg total) by mouth at bedtime as needed for sleep. 01/18/18   Auburn Bilberry, MD    Allergies Patient has no known allergies.  Family History  Problem Relation Age of Onset  . Heart failure Mother   . Lung cancer Mother   . Lung cancer Father  Social History Social History   Tobacco Use  . Smoking status: Never Smoker  . Smokeless tobacco: Never Used  Substance Use Topics  . Alcohol use: Never    Frequency: Never  . Drug use: Never    Review of Systems Constitutional: No fever/chills Eyes: No visual changes. ENT: No sore throat. Cardiovascular: Positive chest pain. Respiratory: Positive shortness of breath. Gastrointestinal: No abdominal pain.  No nausea, no vomiting.  No diarrhea.   Genitourinary: Negative for dysuria. Musculoskeletal: Negative for back pain. Skin: Negative for rash. Neurological: Negative for  headaches, focal weakness or numbness.  ____________________________________________   PHYSICAL EXAM:  VITAL SIGNS: ED Triage Vitals [02/23/18 1702]  Enc Vitals Group     BP 154/111     Pulse 121     Resp 19     Temp 99.2     Temp src      SpO2 100     Weight (!) 389 lb 6 oz (176.6 kg)     Height 5\' 7"  (1.702 m)     Head Circumference      Peak Flow      Pain Score 7   Constitutional: Alert and oriented. Uncomfortable. Moderate respiratory distress Eyes: Conjunctivae are normal.  ENT      Head: Normocephalic and atraumatic.      Nose: No congestion/rhinnorhea.      Mouth/Throat: Mucous membranes are moist.      Neck: No stridor. Hematological/Lymphatic/Immunilogical: No cervical lymphadenopathy. Cardiovascular: Tachycardic, irregular rhythm.  No murmurs, rubs, or gallops.  Respiratory: Increased respiratory effort. Clear breath sounds Gastrointestinal: Soft and non tender. No rebound. No guarding.  Genitourinary: Deferred Musculoskeletal: Normal range of motion in all extremities. Bilateral lower extremity edema. Neurologic:  Normal speech and language. No gross focal neurologic deficits are appreciated.  Skin:  Skin is warm, dry and intact. No rash noted. Psychiatric: Mood and affect are normal. Speech and behavior are normal. Patient exhibits appropriate insight and judgment.  ____________________________________________    LABS (pertinent positives/negatives)  BMP na 135, k 3.3, glu 136, ca 7.7 Trop 0.04 CBC wbc 6.9, hgb 10.4, plt 154  ____________________________________________   EKG  I, Phineas Semen, attending physician, personally viewed and interpreted this EKG  EKG Time: 1704 Rate: 107 Rhythm: atrial fibrillation Axis: normal Intervals: qtc 458 QRS: IVCD ST changes: no st elevation Impression: abnormal ekg   ____________________________________________    RADIOLOGY  CXR No acute  disease  ____________________________________________   PROCEDURES  Procedures  CRITICAL CARE Performed by: Phineas Semen   Total critical care time: 30 minutes  Critical care time was exclusive of separately billable procedures and treating other patients.  Critical care was necessary to treat or prevent imminent or life-threatening deterioration.  Critical care was time spent personally by me on the following activities: development of treatment plan with patient and/or surrogate as well as nursing, discussions with consultants, evaluation of patient's response to treatment, examination of patient, obtaining history from patient or surrogate, ordering and performing treatments and interventions, ordering and review of laboratory studies, ordering and review of radiographic studies, pulse oximetry and re-evaluation of patient's condition.  ____________________________________________   INITIAL IMPRESSION / ASSESSMENT AND PLAN / ED COURSE  Pertinent labs & imaging results that were available during my care of the patient were reviewed by me and considered in my medical decision making (see chart for details).   Patient presented to the emergency department because of concern for shortness of breath and chest pain.  On initial exam  patient was in moderate respiratory distress.  He did appear uncomfortable.  EKG without any concerning ST elevations.  Patient has had similar episodes in the past when his fluid is been elevated.  Patient will be given Lasix and placed on BiPAP.  Chest x-ray without any pneumonia or infiltrate.  Patient did appear to improve on BiPAP and after being given Lasix.  Will plan on admission to the hospitalist.  Discussed plan with patient.  Discussed with patient/family results of testing/physical exam, differential plan and return precautions.  ____________________________________________   FINAL CLINICAL IMPRESSION(S) / ED DIAGNOSES  Final diagnoses:   Shortness of breath  Congestive heart failure, unspecified HF chronicity, unspecified heart failure type [facility])     Note: This dictation was prepared with Dragon dictation. Any transcriptional errors that result from this process are unintentional     Phineas Semen, MD 02/23/18 2039

## 2018-02-23 NOTE — H&P (Signed)
[facility] [place] [facility]   PATIENT NAME: [name] [name]    MR#:  [id number]  DATE OF BIRTH:  14-Apr-1960  DATE OF ADMISSION:  02/23/2018  PRIMARY CARE PHYSICIAN: Patient, No Pcp Per   REQUESTING/REFERRING PHYSICIAN: Derrill Kay, MD  CHIEF COMPLAINT:   Chief Complaint  Patient presents with  . Chest Pain  . Shortness of Breath    HISTORY OF PRESENT ILLNESS:  [name] [name]  is a 58 y.o. male who presents with chief complaint as above.  Patient presents to the ED today acutely short of breath.  He also complains of chest discomfort and diaphoresis.  This all started today about mid afternoon.  He has had increased lower extremity swelling over the last few days, but states that "they are always swollen".  Here in the ED he was found to be hypoxic and tachypneic, requiring BiPAP.  Work-up shows mildly elevated troponin, with a chest x-ray that is largely unimpressive.  However, he states that he has been admitted here in the past for heart failure exacerbation and was told that his chest x-ray was normal at that time as well.  Despite that he was diuresed multiple liters of excess fluid.  Given his need for persistent BiPAP and his work of breathing, hospitalist were called for admission.  PAST MEDICAL HISTORY:   Past Medical History:  Diagnosis Date  . A-fib (HCC)   . Asthma   . CHF (congestive heart failure) (HCC)   . COPD (chronic obstructive pulmonary disease) (HCC)   . Sleep apnea      PAST SURGICAL HISTORY:   Past Surgical History:  Procedure Laterality Date  . hearnia repair    . LEG SURGERY       SOCIAL HISTORY:   Social History   Tobacco Use  . Smoking status: Never Smoker  . Smokeless tobacco: Never Used  Substance Use Topics  . Alcohol use: Never    Frequency: Never     FAMILY HISTORY:   Family History  Problem Relation Age of Onset  . Heart failure Mother   . Lung cancer Mother   . Lung cancer Father      DRUG  ALLERGIES:  No Known Allergies  MEDICATIONS AT HOME:   Prior to Admission medications   Medication Sig Start Date End Date Taking? Authorizing Provider  aspirin 325 MG tablet Take 325 mg by mouth daily.   Yes [provider]  furosemide (LASIX) 80 MG tablet Take 1 tablet (80 mg total) by mouth 2 (two) times daily. 01/29/18 02/28/18 Yes Sudini, Wardell Heath, MD  metoprolol tartrate (LOPRESSOR) 50 MG tablet Take 2 tablets (100 mg total) by mouth 2 (two) times daily. 01/19/18  Yes Auburn Bilberry, MD  nitroGLYCERIN (NITROSTAT) 0.4 MG SL tablet Place 1 tablet (0.4 mg total) under the tongue every 5 (five) minutes as needed for chest pain. 01/18/18  Yes Auburn Bilberry, MD  rivaroxaban (XARELTO) 20 MG TABS tablet Take 1 tablet (20 mg total) by mouth daily. 01/18/18  Yes Auburn Bilberry, MD  sacubitril-valsartan (ENTRESTO) 24-26 MG Take 1 tablet by mouth 2 (two) times daily. D/C lisinopril 02/02/18  Yes Hackney, Inetta Fermo A, FNP  traZODone (DESYREL) 150 MG tablet Take 1 tablet (150 mg total) by mouth at bedtime as needed for sleep. 01/18/18  Yes Auburn Bilberry, MD  albuterol (PROVENTIL HFA;VENTOLIN HFA) 108 (90 Base) MCG/ACT inhaler Inhale 2 puffs into the lungs every 6 (six) hours as needed for wheezing or shortness of breath. 01/29/18  Milagros LollSudini, Srikar, MD  ciprofloxacin (CILOXAN) 0.3 % ophthalmic solution Place 1 drop into both eyes every 2 (two) hours while awake. Administer 1 drop, every 2 hours, while awake, for 2 days. Then 1 drop, every 4 hours, while awake, for the next 5 days. Patient not taking: Reported on 02/23/2018 01/19/18   Auburn BilberryPatel, Shreyang, MD    REVIEW OF SYSTEMS:  Review of Systems  Constitutional: Negative for chills, fever, malaise/fatigue and weight loss.  HENT: Negative for ear pain, hearing loss and tinnitus.   Eyes: Negative for blurred vision, double vision, pain and redness.  Respiratory: Positive for shortness of breath. Negative for cough and hemoptysis.   Cardiovascular:  Positive for chest pain. Negative for palpitations, orthopnea and leg swelling.  Gastrointestinal: Negative for abdominal pain, constipation, diarrhea, nausea and vomiting.  Genitourinary: Negative for dysuria, frequency and hematuria.  Musculoskeletal: Negative for back pain, joint pain and neck pain.  Skin:       No acne, rash, or lesions  Neurological: Negative for dizziness, tremors, focal weakness and weakness.  Endo/Heme/Allergies: Negative for polydipsia. Does not bruise/bleed easily.  Psychiatric/Behavioral: Negative for depression. The patient is not nervous/anxious and does not have insomnia.      VITAL SIGNS:   Vitals:   02/23/18 2118 02/23/18 2119 02/23/18 2120 02/23/18 2122  BP:      Pulse:      Resp: 12 (!) 29 (!) 24 (!) 26  Temp:      TempSrc:      SpO2:      Weight:      Height:       Wt Readings from Last 3 Encounters:  02/23/18 (!) 176.6 kg  02/02/18 (!) 176.6 kg  01/27/18 (!) 167.8 kg    PHYSICAL EXAMINATION:  Physical Exam  Vitals reviewed. Constitutional: He is oriented to person, place, and time. He appears well-developed and well-nourished. No distress.  HENT:  Head: Normocephalic and atraumatic.  Mouth/Throat: Oropharynx is clear and moist.  Eyes: Pupils are equal, round, and reactive to light. Conjunctivae and EOM are normal. No scleral icterus.  Neck: Normal range of motion. Neck supple. No JVD present. No thyromegaly present.  Cardiovascular: Intact distal pulses. Exam reveals no gallop and no friction rub.  No murmur heard. Tachycardic, irregular rhythm  Respiratory: He is in respiratory distress. He has wheezes. He has rales.  GI: Soft. Bowel sounds are normal. He exhibits no distension. There is no abdominal tenderness.  Musculoskeletal: Normal range of motion.        General: Edema present.     Comments: No arthritis, no gout  Lymphadenopathy:    He has no cervical adenopathy.  Neurological: He is alert and oriented to person, place, and  time. No cranial nerve deficit.  No dysarthria, no aphasia  Skin: Skin is warm and dry. No rash noted. No erythema.  Psychiatric: He has a normal mood and affect. His behavior is normal. Judgment and thought content normal.    LABORATORY PANEL:   CBC Recent Labs  Lab 02/23/18 1715  WBC 6.9  HGB 10.4*  HCT 34.1*  PLT 154   ------------------------------------------------------------------------------------------------------------------  Chemistries  Recent Labs  Lab 02/23/18 1715  NA 135  K 3.3*  CL 100  CO2 21*  GLUCOSE 136*  BUN 7  CREATININE 0.78  CALCIUM 7.7*   ------------------------------------------------------------------------------------------------------------------  Cardiac Enzymes Recent Labs  Lab 02/23/18 1715  TROPONINI 0.04*   ------------------------------------------------------------------------------------------------------------------  RADIOLOGY:  Dg Chest 1 View  Result Date: 02/23/2018 CLINICAL DATA:  Left-sided chest pain and shortness of Breath EXAM: CHEST  1 VIEW COMPARISON:  01/27/2018 FINDINGS: Cardiac shadow is mildly enlarged but stable. The lungs are well aerated bilaterally. No focal infiltrate or sizable effusion is seen. No acute bony abnormality is noted. IMPRESSION: No active disease. Electronically Signed   By: Alcide Clever M.D.   On: 02/23/2018 17:46    EKG:   Orders placed or performed during the hospital encounter of 01/27/18  . ED EKG  . ED EKG    IMPRESSION AND PLAN:  Principal Problem:   Acute respiratory failure with hypoxia (HCC) -this is likely due to his heart failure as well as his COPD.  Patient was unable to wean from BiPAP successfully, as his O2 sats still continue to drop into the 60s when off of BiPAP.  His work of breathing also remains significant.  We will admit him to the ICU with continued BiPAP, and close monitoring as he could potentially need intubation tonight if his work of breathing does not  improve.  He was given high-dose Lasix IV in the ED, but will likely need further diuresis as his blood pressure tolerates. Active Problems:   Acute on chronic systolic CHF (congestive heart failure) (HCC) -IV diuresis given as above, continue home medications, further diuresis as blood pressure tolerates, continue BiPAP as above   COPD exacerbation -IV Solu-Medrol and azithromycin ordered, PRN Xopenex nebs given his A. fib and tachycardia, PRN antitussive, continue home meds   Obstructive sleep apnea -BiPAP as above   Afib -continue home dose anticoagulation, patient's heart rate goes up and down depending on his work of breathing, if his blood pressure tolerates he could be given some nodal blocking agents if his heart rate begins to sustain an elevated level.  Chart review performed and case discussed with ED provider. Labs, imaging and/or ECG reviewed by provider and discussed with patient/family. Management plans discussed with the patient and/or family.  DVT PROPHYLAXIS: Systemic anticoagulation  GI PROPHYLAXIS:  None  ADMISSION STATUS: Inpatient     CODE STATUS: Full Code Status History    Date Active Date Inactive Code Status Order ID Comments User Context   01/27/2018 1345 01/29/2018 1406 Full Code [id number]  Alford Highland, MD ED   01/17/2018 1330 01/19/2018 1818 Partial Code [id number]  Milagros Loll, MD Inpatient   01/16/2018 1427 01/17/2018 1330 Full Code [id number]  Auburn Bilberry, MD ED      TOTAL CRITICAL CARE TIME TAKING CARE OF THIS PATIENT: 50 minutes.   [name] [name] FIELDING 02/23/2018, 9:28 PM  [facility]  Office  [phone]  CC: Primary care physician; Patient, No Pcp Per  Note:  This document was prepared using Dragon voice recognition software and may include unintentional dictation errors.

## 2018-02-23 NOTE — ED Notes (Signed)
Georgie RN, aware of bed assigned  

## 2018-02-23 NOTE — ED Notes (Signed)
Next Trop grn top sent to lab.

## 2018-02-23 NOTE — ED Notes (Signed)
NTx2 attempted to place condom cath on pt as pt continues to urinate on self, floor, and bed.

## 2018-02-23 NOTE — ED Triage Notes (Signed)
Pt to ED from home with left sided chest pain radiating down the left arm.

## 2018-02-23 NOTE — ED Notes (Signed)
Date and time results received: 02/23/18 1750 (use smartphrase ".now" to insert current time)  Test: troponin Critical Value: 0.04  Name of Provider Notified: Derrill Kay  Orders Received? Or Actions Taken?: Orders Received - See Orders for details

## 2018-02-23 NOTE — ED Notes (Signed)
Pt called out and states his IV came out and was attached to his shirt. RN to attempt another IV [place].

## 2018-02-23 NOTE — Consult Note (Signed)
Name: [name] [name] MRN: [id number] DOB: 1960-10-02    ADMISSION DATE:  02/23/2018 CONSULTATION DATE:  02/23/2018  REFERRING MD :  Dr. Anne Hahn  CHIEF COMPLAINT:  Shortness of Breath  BRIEF PATIENT DESCRIPTION:  58 y.o. Male admitted with Acute Hypoxic Respiratory Failure in the setting of COPD exacerbation and Acute Decompensated Systolic CHF requiring BiPAP  SIGNIFICANT EVENTS  02/23/18>> Admission to [facility] ICU  STUDIES:  Echocardiogram 02/24/18>>  CULTURES:  ANTIBIOTICS: Azithromycin 1/9>>  HISTORY OF PRESENT ILLNESS:   [name] [name] is a 58 y.o. Male with a PMH of COPD, CHF, Atrial fibrillation, Asthma, and Sleep apnea who presents to [facility] ED on 02/23/18 with c/o shortness of breath and chest pain.  He reports that his symptoms started this afternoon.  He endorses chills, but denies cough, sputum production, hemoptysis, or sick contacts.  He does report increasing edema to his bilateral lower extremities. Given his respiratory distress in ED, he was placed on BiPAP.  Initial workup in the ED revealed Troponin 0.04, BNP 270, WBC 6.9.  CXR was negative for any active disease.  He was given 80 mg IV Lasix and 125 mg Solumedrol.  He is being admitted to [facility] ICU for treatment of Acute Hypoxic Respiratory Failure in the setting of COPD exacerbation and Acute Decompensated Systolic CHF requiring BiPAP.  PCCM is consulted for further management.  PAST MEDICAL HISTORY :   has a past medical history of A-fib (HCC), Asthma, CHF (congestive heart failure) (HCC), COPD (chronic obstructive pulmonary disease) (HCC), and Sleep apnea.  has a past surgical history that includes Leg Surgery and hearnia repair. Prior to Admission medications   Medication Sig Start Date End Date Taking? Authorizing Provider  aspirin 325 MG tablet Take 325 mg by mouth daily.   Yes [provider]  furosemide (LASIX) 80 MG tablet Take 1 tablet (80 mg total) by mouth 2 (two) times daily. 01/29/18 02/28/18 Yes Sudini,  Wardell Heath, MD  metoprolol tartrate (LOPRESSOR) 50 MG tablet Take 2 tablets (100 mg total) by mouth 2 (two) times daily. 01/19/18  Yes Auburn Bilberry, MD  nitroGLYCERIN (NITROSTAT) 0.4 MG SL tablet Place 1 tablet (0.4 mg total) under the tongue every 5 (five) minutes as needed for chest pain. 01/18/18  Yes Auburn Bilberry, MD  rivaroxaban (XARELTO) 20 MG TABS tablet Take 1 tablet (20 mg total) by mouth daily. 01/18/18  Yes Auburn Bilberry, MD  sacubitril-valsartan (ENTRESTO) 24-26 MG Take 1 tablet by mouth 2 (two) times daily. D/C lisinopril 02/02/18  Yes Hackney, Inetta Fermo A, FNP  traZODone (DESYREL) 150 MG tablet Take 1 tablet (150 mg total) by mouth at bedtime as needed for sleep. 01/18/18  Yes Auburn Bilberry, MD  albuterol (PROVENTIL HFA;VENTOLIN HFA) 108 (90 Base) MCG/ACT inhaler Inhale 2 puffs into the lungs every 6 (six) hours as needed for wheezing or shortness of breath. 01/29/18   Milagros Loll, MD  ciprofloxacin (CILOXAN) 0.3 % ophthalmic solution Place 1 drop into both eyes every 2 (two) hours while awake. Administer 1 drop, every 2 hours, while awake, for 2 days. Then 1 drop, every 4 hours, while awake, for the next 5 days. Patient not taking: Reported on 02/23/2018 01/19/18   Auburn Bilberry, MD   No Known Allergies  FAMILY HISTORY:  family history includes Heart failure in his mother; Lung cancer in his father and mother. SOCIAL HISTORY:  reports that he has never smoked. He has never used smokeless tobacco. He reports that he does not drink alcohol or use drugs.  REVIEW OF SYSTEMS:  Positives in BOLD Constitutional: Negative for fever, chills, weight loss, malaise/fatigue and diaphoresis.  HENT: Negative for hearing loss, ear pain, nosebleeds, congestion, sore throat, neck pain, tinnitus and ear discharge.   Eyes: Negative for blurred vision, double vision, photophobia, pain, discharge and redness.  Respiratory: Negative for cough, hemoptysis, sputum production, +shortness of breath, wheezing  and stridor.   Cardiovascular: Negative for +chest pain, palpitations, orthopnea, claudication, leg swelling and PND.  Gastrointestinal: Negative for heartburn, +nausea, vomiting, abdominal pain, diarrhea, constipation, blood in stool and melena.  Genitourinary: Negative for dysuria, urgency, frequency, hematuria and flank pain.  Musculoskeletal: Negative for myalgias, back pain, joint pain and falls.  Skin: Negative for itching and rash.  Neurological: Negative for +dizziness, tingling, tremors, sensory change, speech change, focal weakness, seizures, loss of consciousness, weakness and headaches.  Endo/Heme/Allergies: Negative for environmental allergies and polydipsia. Does not bruise/bleed easily.  SUBJECTIVE:  Pt reports shortness of breath and 7/10 chest pain Denies wheezing, cough, sputum production, or hemoptysis Reports increased swelling to bilateral lower extremities  VITAL SIGNS: Temp:  [99.2 F (37.3 C)] 99.2 F (37.3 C) (01/09 1700) Pulse Rate:  [57-137] 120 (01/09 2100) Resp:  [12-36] 26 (01/09 2122) BP: (125-162)/(100-125) 125/105 (01/09 2100) SpO2:  [65 %-100 %] 94 % (01/09 2100) Weight:  [176.6 kg] 176.6 kg (01/09 1702)  PHYSICAL EXAMINATION: General:  Acute on Chronically ill appearing male, mildlly anxious, sitting up on side of bed, on BiPAP, with mild respiratory distress Neuro:  Awake, A&O x4, no focal deficits, speech clear HEENT:  Atraumatic, normocephalic, neck supple, no JVD Cardiovascular:  Irregularly irregular rhythm, rate controlled currently Lungs:  Clear diminished throughout, tachypnea, mild assessory muscle use, BiPAP assisted Abdomen:  Obese, soft, nontender, no guarding or rebound tenderness,  Musculoskeletal:  No deformities, normal bulk and tone, 3+ edema bilateral LE Skin:  Warm/dry. No obvious rashes, lesions, or ulcerations  Recent Labs  Lab 02/23/18 1715  NA 135  K 3.3*  CL 100  CO2 21*  BUN 7  CREATININE 0.78  GLUCOSE 136*    Recent Labs  Lab 02/23/18 1715  HGB 10.4*  HCT 34.1*  WBC 6.9  PLT 154   Dg Chest 1 View  Result Date: 02/23/2018 CLINICAL DATA:  Left-sided chest pain and shortness of Breath EXAM: CHEST  1 VIEW COMPARISON:  01/27/2018 FINDINGS: Cardiac shadow is mildly enlarged but stable. The lungs are well aerated bilaterally. No focal infiltrate or sizable effusion is seen. No acute bony abnormality is noted. IMPRESSION: No active disease. Electronically Signed   By: Alcide Clever M.D.   On: 02/23/2018 17:46    ASSESSMENT / PLAN:  Acute Hypoxic Respiratory Failure in setting of COPD Exacerbation and Acute Decompensated Systolic CHF  Hx: COPD, Asthma, Sleep Apnea -Supplemental O2 to maintain O2 sats 88 to 94% -BiPAP, wean as tolerated -High risk for intubation -Follow intermittent CXR and ABG -Xopenex nebs given his A-fib, Budesonide nebs -IV Solumedrol 60 mg q6h -Received 80 mg IV Lasix x1 dose; will resume home dose Lasix 80 mg PO BID -Follow BNP -Obtain Echocardiogram  Mildly elevated Troponin, likely demand ischemia -EKG with A-fib and LBBB (LBBB noted on previous EKG's), no obvious ischemic changes Atrial Fibrillation, rate currently controlled Acute Decompensated Systolic CHF -Cardiac monitoring -Trend Troponin -Continue Home Metoprolol -Continue home Xarelto -IV Lasix -Continue home Metoprolol, Entresto  Hypokalemia -Monitor I&O's / urinary output -Follow BMP with Diuresis -Ensure adequate renal perfusion -Avoid nephrotoxic agents as able -Replace electrolytes as indicated -  Will give PO K 40 mEq x1 dose, will then place on 20 mEq PO BID starting tomorrow  Anemia without signs of active bleeding Monitor for S/Sx of bleeding Trend CBC Xarelto for VTE Prophylaxis  Transfuse for Hgb <7    DISPOSITION: ICU GOALS OF CARE: Full Code VTE PROPHYLAXIS: Xarelto UPDATES:  Updated pt at bedside 02/23/18.   Harlon Ditty, AGACNP-BC [facility]  Medicine Pager: [phone] Cell: [phone]  02/23/2018, 9:38 PM

## 2018-02-23 NOTE — ED Notes (Signed)
Carollee Herter RN and RRT transporting pt to ICU07 now.

## 2018-02-23 NOTE — ED Notes (Signed)
Pt refusing to go in bed so pt being transferred in wheelchair.

## 2018-02-23 NOTE — Progress Notes (Addendum)
eLink Physician-Brief Progress Note Patient Name: [name] [name] DOB: [birth year] MRN: [id number]   Date of Service  02/23/2018  HPI/Events of Note  57/M with CHF with EF 30%, COPD, obesity, presenting with worsening shortness of breath. Pt also noted increased lower extremity edema.    In the ED, pt diuresed, started on IV steroids and tx for COPD exacerbation.  PT has been requiring BIPAP. He is awake and alert, sitting upright with BIPAP in place.  2d echo in 01/2018 shows EF 30% CXR with increased perihilar markings.  eICU Interventions  Continue diuretics.  Continue BIPAP for now.  Continue IV steroids, Azithromycin and breathing treatments. Close monitoring of I&Os.  Replete K.  Pt on Xarelto.       Intervention Category Evaluation Type: New Patient Evaluation  Larinda Buttery 02/23/2018, 10:28 PM

## 2018-02-23 NOTE — ED Notes (Signed)
IV team at bedside notified in person to draw Trop.

## 2018-02-23 NOTE — ED Notes (Signed)
Report given to Georgie RN 

## 2018-02-23 NOTE — Progress Notes (Signed)
Pt transported to ICU without any incide , bipap plugged into Redout let ,reoprt Rt in charge of the pt in ICU

## 2018-02-23 NOTE — ED Notes (Signed)
Pt continues to pull off cardiac monitor. Educated to leave it in place.

## 2018-02-24 ENCOUNTER — Inpatient Hospital Stay: Payer: Medicaid Other

## 2018-02-24 DIAGNOSIS — I5023 Acute on chronic systolic (congestive) heart failure: Principal | ICD-10-CM

## 2018-02-24 DIAGNOSIS — G4733 Obstructive sleep apnea (adult) (pediatric): Secondary | ICD-10-CM

## 2018-02-24 LAB — CBC
HCT: 34.9 % — ABNORMAL LOW (ref 39.0–52.0)
HCT: 35.7 % — ABNORMAL LOW (ref 39.0–52.0)
HEMOGLOBIN: 10.6 g/dL — AB (ref 13.0–17.0)
Hemoglobin: 10.7 g/dL — ABNORMAL LOW (ref 13.0–17.0)
MCH: 24.6 pg — ABNORMAL LOW (ref 26.0–34.0)
MCH: 24.6 pg — AB (ref 26.0–34.0)
MCHC: 30.4 g/dL (ref 30.0–36.0)
MCHC: 30 g/dL (ref 30.0–36.0)
MCV: 81 fL (ref 80.0–100.0)
MCV: 82.1 fL (ref 80.0–100.0)
Platelets: 126 10*3/uL — ABNORMAL LOW (ref 150–400)
Platelets: 136 10*3/uL — ABNORMAL LOW (ref 150–400)
RBC: 4.31 MIL/uL (ref 4.22–5.81)
RBC: 4.35 MIL/uL (ref 4.22–5.81)
RDW: 18.8 % — ABNORMAL HIGH (ref 11.5–15.5)
RDW: 19.1 % — ABNORMAL HIGH (ref 11.5–15.5)
WBC: 5.3 10*3/uL (ref 4.0–10.5)
WBC: 5 10*3/uL (ref 4.0–10.5)
nRBC: 0 % (ref 0.0–0.2)
nRBC: 0 % (ref 0.0–0.2)

## 2018-02-24 LAB — BASIC METABOLIC PANEL
Anion gap: 11 (ref 5–15)
Anion gap: 10 (ref 5–15)
BUN: 10 mg/dL (ref 6–20)
BUN: 11 mg/dL (ref 6–20)
CHLORIDE: 97 mmol/L — AB (ref 98–111)
CO2: 27 mmol/L (ref 22–32)
CO2: 26 mmol/L (ref 22–32)
Calcium: 7.9 mg/dL — ABNORMAL LOW (ref 8.9–10.3)
Calcium: 7.9 mg/dL — ABNORMAL LOW (ref 8.9–10.3)
Chloride: 97 mmol/L — ABNORMAL LOW (ref 98–111)
Creatinine, Ser: 0.83 mg/dL (ref 0.61–1.24)
Creatinine, Ser: 0.8 mg/dL (ref 0.61–1.24)
GFR calc Af Amer: >60 mL/min (ref >60)
GFR calc Af Amer: >60 mL/min (ref >60)
GFR calc non Af Amer: >60 mL/min (ref >60)
GFR calc non Af Amer: >60 mL/min (ref >60)
Glucose, Bld: 158 mg/dL — ABNORMAL HIGH (ref 70–99)
Glucose, Bld: 168 mg/dL — ABNORMAL HIGH (ref 70–99)
POTASSIUM: 3.9 mmol/L (ref 3.5–5.1)
Potassium: 4.1 mmol/L (ref 3.5–5.1)
Sodium: 135 mmol/L (ref 135–145)
Sodium: 133 mmol/L — ABNORMAL LOW (ref 135–145)

## 2018-02-24 LAB — TROPONIN I
Troponin I: 0.03 ng/mL (ref <0.03)
Troponin I: 0.03 ng/mL (ref <0.03)
Troponin I: 0.03 ng/mL (ref <0.03)

## 2018-02-24 LAB — MRSA PCR SCREENING: MRSA by PCR: NEGATIVE

## 2018-02-24 LAB — BRAIN NATRIURETIC PEPTIDE: B Natriuretic Peptide: 553 pg/mL — ABNORMAL HIGH (ref 0.0–100.0)

## 2018-02-24 MED ORDER — SODIUM CHLORIDE 0.9 % IV SOLN
500.0000 mg | INTRAVENOUS | Status: DC
  Administered 2018-02-25 – 2018-02-26 (×2): 500 mg via INTRAVENOUS
  Filled 2018-02-24 (×2): qty 500

## 2018-02-24 MED ORDER — TRAZODONE HCL 50 MG PO TABS
150.0000 mg | ORAL_TABLET | Freq: Every evening | ORAL | Status: DC | PRN
  Administered 2018-02-24 – 2018-02-26 (×3): 150 mg via ORAL
  Filled 2018-02-24 (×3): qty 3

## 2018-02-24 MED ORDER — MAGNESIUM SULFATE 2 GM/50ML IV SOLN
2.0000 g | Freq: Once | INTRAVENOUS | Status: AC
  Administered 2018-02-24: 2 g via INTRAVENOUS
  Filled 2018-02-24: qty 50

## 2018-02-24 MED ORDER — SACUBITRIL-VALSARTAN 24-26 MG PO TABS
1.0000 | ORAL_TABLET | Freq: Two times a day (BID) | ORAL | Status: DC
  Administered 2018-02-24 – 2018-02-27 (×5): 1 via ORAL
  Filled 2018-02-24 (×9): qty 1

## 2018-02-24 MED ORDER — METOPROLOL TARTRATE 50 MG PO TABS
100.0000 mg | ORAL_TABLET | Freq: Two times a day (BID) | ORAL | Status: DC
  Administered 2018-02-24 – 2018-02-27 (×5): 100 mg via ORAL
  Filled 2018-02-24 (×5): qty 2

## 2018-02-24 MED ORDER — RIVAROXABAN 20 MG PO TABS
20.0000 mg | ORAL_TABLET | Freq: Every day | ORAL | Status: DC
  Administered 2018-02-24 – 2018-02-27 (×4): 20 mg via ORAL
  Filled 2018-02-24 (×4): qty 1

## 2018-02-24 MED ORDER — POTASSIUM CHLORIDE 10 MEQ/100ML IV SOLN
10.0000 meq | INTRAVENOUS | Status: DC
  Administered 2018-02-24: 10 meq via INTRAVENOUS
  Filled 2018-02-24 (×4): qty 100

## 2018-02-24 MED ORDER — POTASSIUM CHLORIDE CRYS ER 20 MEQ PO TBCR
40.0000 meq | EXTENDED_RELEASE_TABLET | Freq: Once | ORAL | Status: AC
  Administered 2018-02-24: 40 meq via ORAL
  Filled 2018-02-24: qty 2

## 2018-02-24 MED ORDER — FUROSEMIDE 40 MG PO TABS
80.0000 mg | ORAL_TABLET | Freq: Two times a day (BID) | ORAL | Status: DC
  Administered 2018-02-24 (×2): 80 mg via ORAL
  Filled 2018-02-24 (×2): qty 2
  Filled 2018-02-24: qty 4
  Filled 2018-02-24: qty 2

## 2018-02-24 MED ORDER — NITROGLYCERIN 0.4 MG SL SUBL: 0.4000 mg | SUBLINGUAL_TABLET | SUBLINGUAL | Status: DC | PRN

## 2018-02-24 MED ORDER — SODIUM CHLORIDE 0.9 % IV SOLN
500.0000 mg | INTRAVENOUS | Status: DC
  Administered 2018-02-24: 500 mg via INTRAVENOUS
  Filled 2018-02-24 (×2): qty 500

## 2018-02-24 MED ORDER — ASPIRIN 325 MG PO TABS
325.0000 mg | ORAL_TABLET | Freq: Every day | ORAL | Status: DC
  Administered 2018-02-24 – 2018-02-27 (×4): 325 mg via ORAL
  Filled 2018-02-24 (×4): qty 1

## 2018-02-24 NOTE — Progress Notes (Signed)
[employer] [facility] [place] [facility]   PATIENT NAME: [name] [name]    MR#:  [id number]  DATE OF BIRTH:  03/24/1960  SUBJECTIVE:  Patient doing well, no events overnight, successfully weaned off BiPAP, for transfer to regular nursing floor bed later today  REVIEW OF SYSTEMS:  CONSTITUTIONAL: No fever, fatigue or weakness.  EYES: No blurred or double vision.  EARS, NOSE, AND THROAT: No tinnitus or ear pain.  RESPIRATORY: No cough, shortness of breath, wheezing or hemoptysis.  CARDIOVASCULAR: No chest pain, orthopnea, edema.  GASTROINTESTINAL: No nausea, vomiting, diarrhea or abdominal pain.  GENITOURINARY: No dysuria, hematuria.  ENDOCRINE: No polyuria, nocturia,  HEMATOLOGY: No anemia, easy bruising or bleeding SKIN: No rash or lesion. MUSCULOSKELETAL: No joint pain or arthritis.   NEUROLOGIC: No tingling, numbness, weakness.  PSYCHIATRY: No anxiety or depression.   ROS  DRUG ALLERGIES:  No Known Allergies  VITALS:  Blood pressure 114/75, pulse 72, temperature 98.4 F (36.9 C), temperature source Oral, resp. rate (!) 22, height 5\' 8"  (1.727 m), weight (!) 176.6 kg, SpO2 98 %.  PHYSICAL EXAMINATION:  GENERAL:  58 y.o.-year-old patient lying in the bed with no acute distress.  EYES: Pupils equal, round, reactive to light and accommodation. No scleral icterus. Extraocular muscles intact.  HEENT: Head atraumatic, normocephalic. Oropharynx and nasopharynx clear.  NECK:  Supple, no jugular venous distention. No thyroid enlargement, no tenderness.  LUNGS: Normal breath sounds bilaterally, no wheezing, rales,rhonchi or crepitation. No use of accessory muscles of respiration.  CARDIOVASCULAR: S1, S2 normal. No murmurs, rubs, or gallops.  ABDOMEN: Soft, nontender, nondistended. Bowel sounds present. No organomegaly or mass.  EXTREMITIES: No pedal edema, cyanosis, or clubbing.  NEUROLOGIC: Cranial nerves II through XII are intact. Muscle strength 5/5 in all  extremities. Sensation intact. Gait not checked.  PSYCHIATRIC: The patient is alert and oriented x 3.  SKIN: No obvious rash, lesion, or ulcer.   Physical Exam LABORATORY PANEL:   CBC Recent Labs  Lab 02/24/18 0805  WBC 5.0  HGB 10.7*  HCT 35.7*  PLT 136*   ------------------------------------------------------------------------------------------------------------------  Chemistries  Recent Labs  Lab 02/23/18 1715  02/24/18 0805  NA 135   < > 133*  K 3.3*   < > 4.1  CL 100   < > 97*  CO2 21*   < > 26  GLUCOSE 136*   < > 168*  BUN 7   < > 11  CREATININE 0.78   < > 0.80  CALCIUM 7.7*   < > 7.9*  MG 1.4*  --   --    < > = values in this interval not displayed.   ------------------------------------------------------------------------------------------------------------------  Cardiac Enzymes Recent Labs  Lab 02/24/18 0805 02/24/18 1340  TROPONINI 0.03* 0.03*   ------------------------------------------------------------------------------------------------------------------  RADIOLOGY:  Dg Chest 1 View  Result Date: 02/23/2018 CLINICAL DATA:  Left-sided chest pain and shortness of Breath EXAM: CHEST  1 VIEW COMPARISON:  01/27/2018 FINDINGS: Cardiac shadow is mildly enlarged but stable. The lungs are well aerated bilaterally. No focal infiltrate or sizable effusion is seen. No acute bony abnormality is noted. IMPRESSION: No active disease. Electronically Signed   By: Alcide Clever M.D.   On: 02/23/2018 17:46   Dg Chest Port 1 View  Result Date: 02/24/2018 CLINICAL DATA:  Acute respiratory failure. EXAM: PORTABLE CHEST 1 VIEW COMPARISON:  Radiograph of February 23, 2018. FINDINGS: Stable cardiomegaly. No pneumothorax is noted. No significant pleural effusion is noted. No consolidative process is noted. Bony thorax is  unremarkable. IMPRESSION: No acute cardiopulmonary abnormality seen. Electronically Signed   By: Lupita Raider, M.D.   On: 02/24/2018 07:14    ASSESSMENT  AND PLAN:  *Acute hypoxic respiratory failure Secondary to COPD exacerbation Improved Successfully weaned off BiPAP, for transfer to regular nursing for bed, continue IV Solu-Medrol with tapering as tolerated, aggressive pulmonary toilet bronchodilator therapy, inhaled corticosteroids, mucolytic agents, follow-up on cultures, empiric azithromycin, supplemental oxygen with weaning as tolerated, and continue close medical monitoring  *Acute on chronic systolic CHF exacerbation  Resolved  Treated with short course of IV Lasix-changed to p.o., Entresto, continue congestive heart failure protocol, strict I&O monitoring, daily weights, p.o. Lasix   *Chronic obstructive sleep apnea  CPAP at bedtime/as needed   *Chronic history of Afib Stable Continue Lopressor, Xarelto  Disposition Home in 1 to 2 days barring any complications  All the records are reviewed and case discussed with Care Management/Social Workerr. Management plans discussed with the patient, family and they are in agreement.  CODE STATUS: full  TOTAL TIME TAKING CARE OF THIS PATIENT: 35 minutes.     POSSIBLE D/C IN 1-2 DAYS, DEPENDING ON CLINICAL CONDITION.   Evelena Asa  M.D on 02/24/2018   Between 7am to 6pm - Pager - [phone]  After 6pm go to [email or web address] - password [email or web address]  [facility]  Office  [phone]  CC: Primary care physician; Patient, No Pcp Per  Note: This dictation was prepared with Dragon dictation along with smaller phrase technology. Any transcriptional errors that result from this process are unintentional.

## 2018-02-24 NOTE — Progress Notes (Signed)
Family Meeting Note  Advance Directive:yes  Today a meeting took place with the Patient.  Patient is able to participate   The following clinical team members were present during this meeting:MD  The following were discussed:Patient's diagnosis: Respiratory failure, COPD, Patient's progosis: Unable to determine and Goals for treatment: Full Code  Additional follow-up to be provided: prn  Time spent during discussion:20 minutes  Bertrum Sol, MD

## 2018-02-24 NOTE — Progress Notes (Signed)
Follow up - Critical Care Medicine Note  Patient Details:    [name] [name] is an 58 y.o. male. Mr. Holway is a 58 y.o. Male with a PMH of COPD, CHF, Atrial fibrillation, Asthma, and Sleep apnea who presents to [facility] ED on 02/23/18 with c/o shortness of breath and chest pain.  He reports that his symptoms started on the afternoon of admission.  Patient admitted to the ICU requiring BiPAP.  Lines, Airways, Drains:    Anti-infectives:  Anti-infectives (From admission, onward)   Start     Dose/Rate Route Frequency Ordered Stop   02/25/18 1000  azithromycin (ZITHROMAX) 500 mg in sodium chloride 0.9 % 250 mL IVPB     500 mg 250 mL/hr over 60 Minutes Intravenous Every 24 hours 02/24/18 1115     02/24/18 0100  azithromycin (ZITHROMAX) 500 mg in sodium chloride 0.9 % 250 mL IVPB  Status:  Discontinued     500 mg 250 mL/hr over 60 Minutes Intravenous Every 24 hours 02/24/18 0056 02/24/18 1115   02/23/18 2130  azithromycin (ZITHROMAX) tablet 500 mg  Status:  Discontinued     500 mg Oral Daily 02/23/18 2128 02/24/18 0056      Microbiology: Results for orders placed or performed during the hospital encounter of 02/23/18  MRSA PCR Screening     Status: None   Collection Time: 02/24/18  2:03 AM  Result Value Ref Range Status   MRSA by PCR NEGATIVE NEGATIVE Final    Comment:        The GeneXpert MRSA Assay (FDA approved for NASAL specimens only), is one component of a comprehensive MRSA colonization surveillance program. It is not intended to diagnose MRSA infection nor to guide or monitor treatment for MRSA infections. Performed at [facility], [address] [place], [place] [address]     Best Practice/Protocols:  VTE Prophylaxis: Direct Thrombin Inhibitor    Events:   Studies: Dg Chest 1 View  Result Date: 02/23/2018 CLINICAL DATA:  Left-sided chest pain and shortness of Breath EXAM: CHEST  1 VIEW COMPARISON:  01/27/2018 FINDINGS: Cardiac shadow is mildly enlarged  but stable. The lungs are well aerated bilaterally. No focal infiltrate or sizable effusion is seen. No acute bony abnormality is noted. IMPRESSION: No active disease. Electronically Signed   By: Alcide Clever M.D.   On: 02/23/2018 17:46   Dg Chest Port 1 View  Result Date: 02/24/2018 CLINICAL DATA:  Acute respiratory failure. EXAM: PORTABLE CHEST 1 VIEW COMPARISON:  Radiograph of February 23, 2018. FINDINGS: Stable cardiomegaly. No pneumothorax is noted. No significant pleural effusion is noted. No consolidative process is noted. Bony thorax is unremarkable. IMPRESSION: No acute cardiopulmonary abnormality seen. Electronically Signed   By: Lupita Raider, M.D.   On: 02/24/2018 07:14   Dg Chest Port 1 View  Result Date: 01/27/2018 CLINICAL DATA:  Shortness of breath. History of congestive heart failure. EXAM: PORTABLE CHEST 1 VIEW COMPARISON:  01/16/2018 FINDINGS: Chronic cardiomegaly. The lungs are clear. The vascularity is normal. No effusions. No edema. IMPRESSION: no acute cardiopulmonary abnormality. Chronic cardiomegaly. Electronically Signed   By: Paulina Fusi M.D.   On: 01/27/2018 11:17    Consults:    Subjective:    Overnight Issues:  Did well overnight with BiPAP.  Sitting up in chair this morning without respiratory distress.  On high flow O2 at 45%.  Requesting something to eat.  Had some ectopics overnight.  Noted low magnesium level. Objective:  Vital signs for last 24 hours:  Temp:  [97.8 F (36.6 C)-99.4 F (37.4 C)] 98.4 F (36.9 C) (01/10 1310) Pulse Rate:  [57-168] 72 (01/10 1310) Resp:  [12-36] 22 (01/10 1310) BP: (114-162)/(48-125) 114/75 (01/10 1310) SpO2:  [65 %-100 %] 98 % (01/10 1310) FiO2 (%):  [45 %-50 %] 45 % (01/10 0600) Weight:  [176.6 kg] 176.6 kg (01/09 1702)  Hemodynamic parameters for last 24 hours:    Intake/Output from previous day: 01/09 0701 - 01/10 0700 In: 252 [IV Piggyback:252] Out: 675 [Urine:675]  Intake/Output this shift: Total I/O In:  47.7 [IV Piggyback:47.7] Out: 75 [Urine:75]  Vent settings for last 24 hours: FiO2 (%):  [45 %-50 %] 45 %  Physical Exam:  General:  Super morbidly obese male, comfortable sitting up on high flow nasal cannula, no respiratory distress.   Neuro:  Awake, A&O x4, no focal deficits, speech clear HEENT:  Atraumatic, normocephalic, neck thick, cannot appreciate JVD however difficult to see due to thick neck. Cardiovascular:  Irregularly irregular rhythm, controlled ventricular response.   Lungs:  Clear diminished throughout due to body habitus, no tachypnea, no accessory muscle use Abdomen:  Obese, soft, nontender, no guarding or rebound tenderness,  Musculoskeletal:  No deformities, normal bulk and tone, 2+ edema bilateral LE Skin:  Warm/dry. No obvious rashes, lesions, or ulcerations  Assessment/Plan:  1.  Acute on chronic hypoxic respiratory failure: Due more likely to decompensated systolic heart failure.  He has cleared very quickly with diuretics this does not support infectious process nor COPD as cause for exacerbation.  Continue diuretics as tolerates.  Trend BMP.He has known systolic heart failure with EF of 30% previously noted on recent echocardiogram of 17 January 2018.  2.  Mild troponin elevation likely due to demand ischemia: Diaphragmatic work of breathing can also cause this pattern.  He has chronic left bundle branch block and no obvious ischemic changes noted on EKG.  Troponins have been flat, now decreasing.  Continue supportive care.  3.  Persistent atrial fibrillation: He is currently with controlled ventricular response.  He has had however some ectopy noted on monitor and low magnesium levels.  Will supplement magnesium.  4.  Hypomagnesemia: Replete.  5.  Hypokalemia: Corrected.  6.  Super morbid obesity with BMI of 59: Obesity with obesity hypoventilation also aggravates his issues above.  Issue adds complexity to his management.  Patient also has issues with  obstructive sleep apnea.  Continue nocturnal BiPAP as he transitions to the MedSurg ward.    LOS: 1 day   Additional comments:Multidisciplinary rounds were performed with the ICU team.  Visit total Time*: 35 Minutes  Sarina Ser 02/24/2018  *Care during the described time interval was provided by me and/or other providers on the critical care team.  I have reviewed this patient's available data, including medical history, events of note, physical examination and test results as part of my evaluation.

## 2018-02-25 LAB — MAGNESIUM: Magnesium: 1.8 mg/dL (ref 1.7–2.4)

## 2018-02-25 MED ORDER — METHYLPREDNISOLONE SODIUM SUCC 125 MG IJ SOLR
60.0000 mg | INTRAMUSCULAR | Status: DC
  Administered 2018-02-26 – 2018-02-27 (×2): 60 mg via INTRAVENOUS
  Filled 2018-02-25 (×2): qty 2

## 2018-02-25 MED ORDER — FUROSEMIDE 10 MG/ML IJ SOLN
80.0000 mg | Freq: Two times a day (BID) | INTRAMUSCULAR | Status: DC
  Administered 2018-02-25 (×2): 80 mg via INTRAVENOUS
  Filled 2018-02-25 (×2): qty 8

## 2018-02-25 NOTE — Progress Notes (Signed)
  Advanced care planning  Purpose of Encounter CHF  Parties in Attendance patient  Patients Decisional capacity Alert and oriented.  Able to make medical decisions.  No documented healthcare power of attorney or advanced directives in place  Skills regarding congestive heart failure, prognosis and treatment.  CODE STATUS full code with full scope of treatment  Time spent - 17 minutes

## 2018-02-25 NOTE — Progress Notes (Signed)
[employer] - [place] at [facility]   PATIENT NAME: [name] [name]    MR#:  [id number]  DATE OF BIRTH:  [birth year]  SUBJECTIVE:   On oxygen 3 L/min Sob is improving Still has significant swelling  REVIEW OF SYSTEMS:  CONSTITUTIONAL: No fever, fatigue or weakness.  EYES: No blurred or double vision.  EARS, NOSE, AND THROAT: No tinnitus or ear pain.  RESPIRATORY: SOB and cough present CARDIOVASCULAR: No chest pain. LE edema and orthopnea present GASTROINTESTINAL: No nausea, vomiting, diarrhea or abdominal pain.  GENITOURINARY: No dysuria, hematuria.  ENDOCRINE: No polyuria, nocturia,  HEMATOLOGY: No anemia, easy bruising or bleeding SKIN: No rash or lesion. MUSCULOSKELETAL: No joint pain or arthritis.   NEUROLOGIC: No tingling, numbness, weakness.  PSYCHIATRY: No anxiety or depression.   ROS  DRUG ALLERGIES:  No Known Allergies  VITALS:  Blood pressure 105/71, pulse 73, temperature 97.8 F (36.6 C), temperature source Oral, resp. rate 19, height 5\' 8"  (1.727 m), weight (!) 179.1 kg, SpO2 97 %.  PHYSICAL EXAMINATION:  GENERAL:  58 y.o.-year-old patient lying in the bed . Morbidly obese EYES: Pupils equal, round, reactive to light and accommodation. No scleral icterus. Extraocular muscles intact.  HEENT: Head atraumatic, normocephalic. Oropharynx and nasopharynx clear.  NECK:  Supple, no jugular venous distention. No thyroid enlargement, no tenderness.  LUNGS: Decreased breath sounds CARDIOVASCULAR: S1, S2 normal. No murmurs, rubs, or gallops.  ABDOMEN: Soft, nontender, nondistended. Bowel sounds present. No organomegaly or mass.  EXTREMITIES: B/L edema.  NEUROLOGIC: Cranial nerves II through XII are intact. Muscle strength 5/5 in all extremities. Sensation intact. Gait not checked.  PSYCHIATRIC: The patient is alert and oriented x 3.  SKIN: No obvious rash, lesion, or ulcer.   Physical Exam LABORATORY PANEL:   CBC Recent Labs  Lab 02/24/18 0805   WBC 5.0  HGB 10.7*  HCT 35.7*  PLT 136*   ------------------------------------------------------------------------------------------------------------------  Chemistries  Recent Labs  Lab 02/24/18 0805 02/25/18 0326  NA 133*  --   K 4.1  --   CL 97*  --   CO2 26  --   GLUCOSE 168*  --   BUN 11  --   CREATININE 0.80  --   CALCIUM 7.9*  --   MG  --  1.8   ------------------------------------------------------------------------------------------------------------------  Cardiac Enzymes Recent Labs  Lab 02/24/18 1340 02/24/18 1924  TROPONINI 0.03* 0.03*   ------------------------------------------------------------------------------------------------------------------  RADIOLOGY:  Dg Chest 1 View  Result Date: 02/23/2018 CLINICAL DATA:  Left-sided chest pain and shortness of Breath EXAM: CHEST  1 VIEW COMPARISON:  01/27/2018 FINDINGS: Cardiac shadow is mildly enlarged but stable. The lungs are well aerated bilaterally. No focal infiltrate or sizable effusion is seen. No acute bony abnormality is noted. IMPRESSION: No active disease. Electronically Signed   By: Alcide Clever M.D.   On: 02/23/2018 17:46   Dg Chest Port 1 View  Result Date: 02/24/2018 CLINICAL DATA:  Acute respiratory failure. EXAM: PORTABLE CHEST 1 VIEW COMPARISON:  Radiograph of February 23, 2018. FINDINGS: Stable cardiomegaly. No pneumothorax is noted. No significant pleural effusion is noted. No consolidative process is noted. Bony thorax is unremarkable. IMPRESSION: No acute cardiopulmonary abnormality seen. Electronically Signed   By: Lupita Raider, M.D.   On: 02/24/2018 07:14    ASSESSMENT AND PLAN:  *Acute hypoxic respiratory failure due to COPD and CHF exacerbation Wean Oxygen as tolerated  * Acute COPD exacerbation Steroids Nebs inhalers  *Acute on chronic systolic CHF exacerbation  Still significantly fluid  overloaded. Will need inpatient diuresis Change PO lasix to IV today. Monitor  I/Os  *Chronic obstructive sleep apnea  CPAP at bedtime/as needed   *Chronic history of Afib Stable Continue Lopressor, Xarelto  All the records are reviewed and case discussed with Care Management/Social Worker Management plans discussed with the patient, family and they are in agreement.  CODE STATUS:  FULL CODE  TOTAL TIME TAKING CARE OF THIS PATIENT: 35 minutes.   POSSIBLE D/C IN 1-2 DAYS, DEPENDING ON CLINICAL CONDITION.  Orie Fisherman M.D on 02/25/2018   Between 7am to 6pm - Pager - [phone]  After 6pm go to [email or web address] - password [email or web address]  [facility] [place] [facility]  Office  [phone]  CC: Primary care physician; Patient, No Pcp Per  Note: This dictation was prepared with Dragon dictation along with smaller phrase technology. Any transcriptional errors that result from this process are unintentional.

## 2018-02-26 LAB — BASIC METABOLIC PANEL
ANION GAP: 7 (ref 5–15)
BUN: 29 mg/dL — ABNORMAL HIGH (ref 6–20)
CALCIUM: 8.4 mg/dL — AB (ref 8.9–10.3)
CO2: 31 mmol/L (ref 22–32)
Chloride: 97 mmol/L — ABNORMAL LOW (ref 98–111)
Creatinine, Ser: 0.84 mg/dL (ref 0.61–1.24)
GFR calc Af Amer: >60 mL/min (ref >60)
Glucose, Bld: 125 mg/dL — ABNORMAL HIGH (ref 70–99)
Potassium: 4 mmol/L (ref 3.5–5.1)
Sodium: 135 mmol/L (ref 135–145)

## 2018-02-26 MED ORDER — FUROSEMIDE 10 MG/ML IJ SOLN
80.0000 mg | Freq: Three times a day (TID) | INTRAMUSCULAR | Status: DC
  Administered 2018-02-26 – 2018-02-27 (×4): 80 mg via INTRAVENOUS
  Filled 2018-02-26 (×4): qty 8

## 2018-02-26 NOTE — Plan of Care (Signed)

## 2018-02-26 NOTE — Progress Notes (Signed)
[employer] -  at [facility]   PATIENT NAME: [name] [name]    MR#:  [id number]  DATE OF BIRTH:  [birth year]  SUBJECTIVE:   Patient took oxygen off today.  Afebrile today feels better after yesterday's diuresis.  -3 L.  Continues to have significant swelling in the legs and abdomen.  REVIEW OF SYSTEMS:  CONSTITUTIONAL: No fever, fatigue or weakness.  EYES: No blurred or double vision.  EARS, NOSE, AND THROAT: No tinnitus or ear pain.  RESPIRATORY: SOB and cough present CARDIOVASCULAR: No chest pain. LE edema and orthopnea present GASTROINTESTINAL: No nausea, vomiting, diarrhea or abdominal pain.  GENITOURINARY: No dysuria, hematuria.  ENDOCRINE: No polyuria, nocturia,  HEMATOLOGY: No anemia, easy bruising or bleeding SKIN: No rash or lesion. MUSCULOSKELETAL: No joint pain or arthritis.   NEUROLOGIC: No tingling, numbness, weakness.  PSYCHIATRY: No anxiety or depression.   ROS  DRUG ALLERGIES:  No Known Allergies  VITALS:  Blood pressure (!) 108/49, pulse 60, temperature 97.8 F (36.6 C), temperature source Oral, resp. rate 17, height 5\' 8"  (1.727 m), weight (!) 178.3 kg, SpO2 97 %.  PHYSICAL EXAMINATION:  GENERAL:  58 y.o.-year-old patient lying in the bed . Morbidly obese EYES: Pupils equal, round, reactive to light and accommodation. No scleral icterus. Extraocular muscles intact.  HEENT: Head atraumatic, normocephalic. Oropharynx and nasopharynx clear.  NECK:  Supple, no jugular venous distention. No thyroid enlargement, no tenderness.  LUNGS: Decreased breath sounds CARDIOVASCULAR: S1, S2 normal. No murmurs, rubs, or gallops.  ABDOMEN: Soft, nontender, nondistended. Bowel sounds present. No organomegaly or mass.  EXTREMITIES: B/L edema.  NEUROLOGIC: Cranial nerves II through XII are intact. Muscle strength 5/5 in all extremities. Sensation intact. Gait not checked.  PSYCHIATRIC: The patient is alert and oriented x 3.  SKIN: No obvious rash,  lesion, or ulcer.   Physical Exam LABORATORY PANEL:   CBC Recent Labs  Lab 02/24/18 0805  WBC 5.0  HGB 10.7*  HCT 35.7*  PLT 136*   ------------------------------------------------------------------------------------------------------------------  Chemistries  Recent Labs  Lab 02/25/18 0326 02/26/18 0508  NA  --  135  K  --  4.0  CL  --  97*  CO2  --  31  GLUCOSE  --  125*  BUN  --  29*  CREATININE  --  0.84  CALCIUM  --  8.4*  MG 1.8  --    ------------------------------------------------------------------------------------------------------------------  Cardiac Enzymes Recent Labs  Lab 02/24/18 1340 02/24/18 1924  TROPONINI 0.03* 0.03*   ------------------------------------------------------------------------------------------------------------------  RADIOLOGY:  No results found.  ASSESSMENT AND PLAN:  *Acute hypoxic respiratory failure due to COPD and CHF exacerbation Wean Oxygen as tolerated  * Acute COPD exacerbation Steroids Nebs inhalers  *Acute on chronic systolic CHF exacerbation  He continues to be significantly fluid overloaded.   Will need further inpatient diuresis through IV Lasix Monitor input and output.  Replace potassium as needed.  *Chronic obstructive sleep apnea  CPAP at bedtime/as needed   *Chronic history of Afib Stable Continue Lopressor, Xarelto  All the records are reviewed and case discussed with Care Management/Social Worker Management plans discussed with the patient, family and they are in agreement.  CODE STATUS:  FULL CODE  TOTAL TIME TAKING CARE OF THIS PATIENT: 35 minutes.   POSSIBLE D/C IN 1-2 DAYS, DEPENDING ON CLINICAL CONDITION.  Orie Fisherman M.D on 02/26/2018   Between 7am to 6pm - Pager - [phone]  After 6pm go to [email or web address] - password [email or web address]  [employer]  Office  [phone]  CC: Primary care physician; Patient, No Pcp Per  Note: This dictation was prepared  with Dragon dictation along with smaller phrase technology. Any transcriptional errors that result from this process are unintentional.

## 2018-02-26 NOTE — Plan of Care (Signed)
  Problem: Clinical Measurements: Goal: Ability to maintain clinical measurements within normal limits will improve Outcome: Progressing Goal: Will remain free from infection Outcome: Progressing Goal: Diagnostic test results will improve Outcome: Progressing Goal: Respiratory complications will improve Outcome: Progressing Goal: Cardiovascular complication will be avoided Outcome: Progressing   Problem: Activity: Goal: Risk for activity intolerance will decrease Outcome: Progressing   Problem: Education: Goal: Knowledge of General Education information will improve Description Including pain rating scale, medication(s)/side effects and non-pharmacologic comfort measures Outcome: Completed/Met   Problem: Health Behavior/Discharge Planning: Goal: Ability to manage health-related needs will improve Outcome: Completed/Met   Problem: Nutrition: Goal: Adequate nutrition will be maintained Outcome: Completed/Met   Problem: Coping: Goal: Level of anxiety will decrease Outcome: Completed/Met   Problem: Elimination: Goal: Will not experience complications related to bowel motility Outcome: Completed/Met Goal: Will not experience complications related to urinary retention Outcome: Completed/Met

## 2018-02-27 LAB — CBC
HCT: 36 % — ABNORMAL LOW (ref 39.0–52.0)
HEMOGLOBIN: 10.8 g/dL — AB (ref 13.0–17.0)
MCH: 24.5 pg — ABNORMAL LOW (ref 26.0–34.0)
MCHC: 30 g/dL (ref 30.0–36.0)
MCV: 81.8 fL (ref 80.0–100.0)
Platelets: 174 10*3/uL (ref 150–400)
RBC: 4.4 MIL/uL (ref 4.22–5.81)
RDW: 19.7 % — ABNORMAL HIGH (ref 11.5–15.5)
WBC: 8.7 10*3/uL (ref 4.0–10.5)
nRBC: 0 % (ref 0.0–0.2)

## 2018-02-27 LAB — BASIC METABOLIC PANEL
Anion gap: 8 (ref 5–15)
BUN: 30 mg/dL — ABNORMAL HIGH (ref 6–20)
CO2: 32 mmol/L (ref 22–32)
Calcium: 8.4 mg/dL — ABNORMAL LOW (ref 8.9–10.3)
Chloride: 94 mmol/L — ABNORMAL LOW (ref 98–111)
Creatinine, Ser: 0.93 mg/dL (ref 0.61–1.24)
GFR calc Af Amer: >60 mL/min (ref >60)
GLUCOSE: 126 mg/dL — AB (ref 70–99)
Potassium: 3.9 mmol/L (ref 3.5–5.1)
Sodium: 134 mmol/L — ABNORMAL LOW (ref 135–145)

## 2018-02-27 MED ORDER — POTASSIUM CHLORIDE ER 10 MEQ PO TBCR: 10.0000 meq | EXTENDED_RELEASE_TABLET | Freq: Two times a day (BID) | ORAL | 0 refills | Status: DC

## 2018-02-27 MED ORDER — ACETAMINOPHEN 325 MG PO TABS
650.0000 mg | ORAL_TABLET | Freq: Four times a day (QID) | ORAL | Status: DC | PRN
  Administered 2018-02-27: 650 mg via ORAL
  Filled 2018-02-27: qty 2

## 2018-02-27 MED ORDER — TORSEMIDE 20 MG PO TABS: 60.0000 mg | ORAL_TABLET | Freq: Two times a day (BID) | ORAL | 0 refills | Status: DC

## 2018-02-27 NOTE — Plan of Care (Signed)
  Problem: Clinical Measurements: Goal: Ability to maintain clinical measurements within normal limits will improve Outcome: Progressing Goal: Will remain free from infection Outcome: Progressing Goal: Diagnostic test results will improve Outcome: Progressing Goal: Respiratory complications will improve Outcome: Progressing Goal: Cardiovascular complication will be avoided Outcome: Progressing   Problem: Activity: Goal: Risk for activity intolerance will decrease Outcome: Progressing   Problem: Pain Managment: Goal: General experience of comfort will improve Outcome: Progressing   Problem: Safety: Goal: Ability to remain free from injury will improve Outcome: Progressing   Problem: Skin Integrity: Goal: Risk for impaired skin integrity will decrease Outcome: Progressing   

## 2018-02-27 NOTE — Discharge Instructions (Signed)
°-   Daily fluids < 2 liters. °- Low salt diet °- Check weight everyday and keep log. Take to your doctors appt. °- Take extra dose of Torsemide if you gain more than 3 pounds weight. ° ° °

## 2018-02-27 NOTE — Care Management Note (Signed)
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 Patient declines [facility] Patient states he has a walker and a wheel chair at home, he has no DME needs at this time Patient uses [facility] Patient has Medicaid from [place] and is trying to get transferred, he stated that he thinks that it is transferred but not sure I gave the information for Med Mgt in case    Action/Plan: [facility] list provided per CMS.gov  Expected Discharge Date:  02/27/18               Expected Discharge Plan:  Home/Self Care  In-House Referral:     Discharge planning Services  CM Consult  Post Acute Care Choice:    Choice offered to:  Patient  DME Arranged:    DME Agency:     HH Arranged:    [place] Agency:     Status of Service:  Completed, signed off  If discussed at [employer] of Stay Meetings, dates discussed:    Additional Comments:  Su Hilt, RN 02/27/2018, 11:39 AM

## 2018-03-08 NOTE — Discharge Summary (Signed)
[employer] - [place] at [facility]   PATIENT NAME: [name] [name]    MR#:  [id number]  DATE OF BIRTH:  09-Jun-1960  DATE OF ADMISSION:  02/23/2018 ADMITTING PHYSICIAN: Oralia Manis, MD  DATE OF DISCHARGE: 02/27/2018 12:50 PM  PRIMARY CARE PHYSICIAN: Patient, No Pcp Per   ADMISSION DIAGNOSIS:  Shortness of breath [R06.02] Congestive heart failure, unspecified HF chronicity, unspecified heart failure type (HCC) [I50.9] Left-sided chest pain [R07.9]  DISCHARGE DIAGNOSIS:  Principal Problem:   Acute respiratory failure with hypoxia (HCC) Active Problems:   Acute on chronic systolic CHF (congestive heart failure) (HCC)   COPD (chronic obstructive pulmonary disease) (HCC)   Obstructive sleep apnea   SECONDARY DIAGNOSIS:   Past Medical History:  Diagnosis Date  . A-fib (HCC)   . Asthma   . CHF (congestive heart failure) (HCC)   . COPD (chronic obstructive pulmonary disease) (HCC)   . Sleep apnea      ADMITTING HISTORY  HISTORY OF PRESENT ILLNESS:  [name] [name]  is a 58 y.o. male who presents with chief complaint as above.  Patient presents to the ED today acutely short of breath.  He also complains of chest discomfort and diaphoresis.  This all started today about mid afternoon.  He has had increased lower extremity swelling over the last few days, but states that "they are always swollen".  Here in the ED he was found to be hypoxic and tachypneic, requiring BiPAP.  Work-up shows mildly elevated troponin, with a chest x-ray that is largely unimpressive.  However, he states that he has been admitted here in the past for heart failure exacerbation and was told that his chest x-ray was normal at that time as well.  Despite that he was diuresed multiple liters of excess fluid.  Given his need for persistent BiPAP and his work of breathing, hospitalist were called for admission.  HOSPITAL COURSE:   *Acute hypoxic respiratory failure due to COPD and CHF  exacerbation Initially patient was on BiPAP and transitioned to nasal cannula.  By day of discharge he is on room air saturating greater than 94%.  * Acute COPD exacerbation Due to the steroids and nebulizers.  Resolved by the time of discharge.  *Acute on chronic systolic CHF exacerbation  This is due to noncompliance with diet.  He is on high doses of diuretics.  Counseled on multiple occasions regarding fluid and salt restriction and weight check at home.  Follow-up with CHF clinic.  Patient had recently moved from [place] and has had recurrent admissions here at [facility] due to to congestive heart failure.  He is still trying to find doctors and transfer his Medicaid.  High risk for readmission.  He tells me he is going to go to [facility] after discharge since he does not like the bland low-sodium food of the hospital.  *Chronic obstructive sleep apnea  CPAP at bedtime/as needed   *Chronic history ofAfib Stable Continue Lopressor, Xarelto  Patient discharged home in stable condition with torsemide.  Follow-up with CHF clinic  CONSULTS OBTAINED:    DRUG ALLERGIES:  No Known Allergies  DISCHARGE MEDICATIONS:   Allergies as of 02/27/2018   No Known Allergies     Medication List    STOP taking these medications   ciprofloxacin 0.3 % ophthalmic solution Commonly known as:  CILOXAN   furosemide 80 MG tablet Commonly known as:  LASIX     TAKE these medications   albuterol 108 (90 Base) MCG/ACT inhaler  Commonly known as:  PROVENTIL HFA;VENTOLIN HFA Inhale 2 puffs into the lungs every 6 (six) hours as needed for wheezing or shortness of breath.   aspirin 325 MG tablet Take 325 mg by mouth daily.   metoprolol tartrate 50 MG tablet Commonly known as:  LOPRESSOR Take 2 tablets (100 mg total) by mouth 2 (two) times daily.   nitroGLYCERIN 0.4 MG SL tablet Commonly known as:  NITROSTAT Place 1 tablet (0.4 mg total) under the tongue every 5 (five)  minutes as needed for chest pain.   potassium chloride 10 MEQ tablet Commonly known as:  K-DUR Take 1 tablet (10 mEq total) by mouth 2 (two) times daily.   rivaroxaban 20 MG Tabs tablet Commonly known as:  XARELTO Take 1 tablet (20 mg total) by mouth daily.   sacubitril-valsartan 24-26 MG Commonly known as:  ENTRESTO Take 1 tablet by mouth 2 (two) times daily. D/C lisinopril   torsemide 20 MG tablet Commonly known as:  DEMADEX Take 3 tablets (60 mg total) by mouth 2 (two) times daily.   traZODone 150 MG tablet Commonly known as:  DESYREL Take 1 tablet (150 mg total) by mouth at bedtime as needed for sleep.       Today   VITAL SIGNS:  Blood pressure 114/79, pulse 82, temperature 98.7 F (37.1 C), temperature source Oral, resp. rate 18, height 5\' 8"  (1.727 m), weight (!) 174.7 kg, SpO2 98 %.  I/O:  No intake or output data in the 24 hours ending 03/08/18 0227  PHYSICAL EXAMINATION:  Physical Exam  GENERAL:  58 y.o.-year-old patient lying in the bed with no acute distress.  LUNGS: Normal breath sounds bilaterally, no wheezing, rales,rhonchi or crepitation. No use of accessory muscles of respiration.  CARDIOVASCULAR: S1, S2 normal. No murmurs, rubs, or gallops.  ABDOMEN: Soft, non-tender, non-distended. Bowel sounds present. No organomegaly or mass.  NEUROLOGIC: Moves all 4 extremities. PSYCHIATRIC: The patient is alert and oriented x 3.  SKIN: No obvious rash, lesion, or ulcer.   DATA REVIEW:   CBC No results for input(s): WBC, HGB, HCT, PLT in the last 168 hours.  Chemistries  No results for input(s): NA, K, CL, CO2, GLUCOSE, BUN, CREATININE, CALCIUM, MG, AST, ALT, ALKPHOS, BILITOT in the last 168 hours.  Invalid input(s): GFRCGP  Cardiac Enzymes No results for input(s): TROPONINI in the last 168 hours.  Microbiology Results  Results for orders placed or performed during the hospital encounter of 02/23/18  MRSA PCR Screening     Status: None   Collection  Time: 02/24/18  2:03 AM  Result Value Ref Range Status   MRSA by PCR NEGATIVE NEGATIVE Final    Comment:        The GeneXpert MRSA Assay (FDA approved for NASAL specimens only), is one component of a comprehensive MRSA colonization surveillance program. It is not intended to diagnose MRSA infection nor to guide or monitor treatment for MRSA infections. Performed at [facility], [address] [place], [place] [address]     RADIOLOGY:  No results found.  Follow up with PCP in 1 week.  Management plans discussed with the patient, family and they are in agreement.  CODE STATUS:  Code Status History    Date Active Date Inactive Code Status Order ID Comments User Context   02/24/2018 0725 02/27/2018 1555 Full Code [id number]  Oralia Manis, MD Inpatient   01/27/2018 1345 01/29/2018 1406 Full Code [id number]  Alford Highland, MD ED   01/17/2018 1330 01/19/2018 1818  Partial Code [id number]  Milagros Loll, MD Inpatient   01/16/2018 1427 01/17/2018 1330 Full Code [id number]  Auburn Bilberry, MD ED      TOTAL TIME TAKING CARE OF THIS PATIENT ON DAY OF DISCHARGE: more than 30 minutes.   Molinda Bailiff Breyona Swander M.D on 03/08/2018 at 2:27 AM  Between 7am to 6pm - Pager - [phone]  After 6pm go to [email or web address] - password [email or web address]  [facility] [place] [facility]  Office  [phone]  CC: Primary care physician; Patient, No Pcp Per  Note: This dictation was prepared with Dragon dictation along with smaller phrase technology. Any transcriptional errors that result from this process are unintentional.

## 2018-03-15 NOTE — Progress Notes (Deleted)
Patient ID: [name] [name], male    DOB: [birth year], 58 y.o.   MRN: [id number]  HPI  [name] [name] is a 58 y/o male with a history of asthma, COPD, atrial fibrillation, obstructive sleep apnea and chronic heart failure.   Echo report from 01/17/18 reviewed and showed an EF of 30%.  Admitted 02/23/2018 due to COPD/HF exacerbation. Initially needed bipap but then was transitioned to room air. Given steroids and nebulizers. Counseled regarding low sodium diet although he says he's going to [employer] once discharged. Discharged after 4 days. Admitted 01/27/18 due to acute on chronic HF. Initially given IV lasix and then transitioned to oral diuretics. Also needed bipap initially and then was transitioned off. Troponin elevation thought to be due to demand ischemia. Discharged after 2 days. Admitted 01/16/18 due to HF/COPD exacerbation. Initially required bipap and then transitioned to room air. Had recently moved from [facility] and had been without some of his medications. Discharged after 3 days.   He presents today for a follow-up visit with a chief complaint of   Past Medical History:  Diagnosis Date  . A-fib (HCC)   . Asthma   . CHF (congestive heart failure) (HCC)   . COPD (chronic obstructive pulmonary disease) (HCC)   . Sleep apnea    Past Surgical History:  Procedure Laterality Date  . hearnia repair    . LEG SURGERY     Family History  Problem Relation Age of Onset  . Heart failure Mother   . Lung cancer Mother   . Lung cancer Father    Social History   Tobacco Use  . Smoking status: Never Smoker  . Smokeless tobacco: Never Used  Substance Use Topics  . Alcohol use: Never    Frequency: Never   No Known Allergies   Review of Systems  Constitutional: Positive for fatigue (with minimal exertion).  HENT: Positive for rhinorrhea. Negative for congestion and sore throat.   Eyes: Negative.   Respiratory: Positive for shortness of breath (with minimal exertion).   Cardiovascular:  Positive for chest pain (under left breast), palpitations and leg swelling.  Gastrointestinal: Negative for abdominal distention and abdominal pain.  Endocrine: Negative.   Genitourinary: Negative.   Musculoskeletal: Positive for arthralgias (knees/ hip/ right shoulder) and back pain (chronic).  Skin: Positive for wound (left lower leg).  Allergic/Immunologic: Negative.   Neurological: Positive for dizziness. Negative for light-headedness.  Hematological: Negative for adenopathy. Does not bruise/bleed easily.  Psychiatric/Behavioral: Positive for sleep disturbance (wearing CPAP at night). Negative for dysphoric mood. The patient is not nervous/anxious.      Physical Exam Vitals signs and nursing note reviewed.  Constitutional:      Appearance: He is well-developed.  HENT:     Head: Normocephalic and atraumatic.  Neck:     Musculoskeletal: Normal range of motion and neck supple.     Vascular: No JVD.  Cardiovascular:     Rate and Rhythm: Normal rate and regular rhythm.  Pulmonary:     Effort: Pulmonary effort is normal.     Breath sounds: Normal breath sounds. No wheezing or rales.  Abdominal:     Palpations: Abdomen is soft.     Tenderness: There is no abdominal tenderness.  Musculoskeletal:     Right lower leg: He exhibits no tenderness. Edema (2+ pitting) present.     Left lower leg: He exhibits no tenderness. Edema (2+ pitting) present.  Skin:    General: Skin is warm and dry.  Neurological:  General: No focal deficit present.     Mental Status: He is alert and oriented to person, place, and time.  Psychiatric:        Mood and Affect: Mood normal.        Behavior: Behavior normal.    Assessment & Plan:  1: Chronic heart failure with reduced ejection fraction- - NYHA class III - euvolemic today - weighing daily; reminded to weigh himself every morning, write the weight down and call for an overnight weight gain of >2 pounds or a weekly weight gain of >5 pounds -  weight  - not adding salt and has recently started reading food labels. Reviewed the importance of closely following a 2000mg  sodium diet   - says that he drinks "a lot" of milk  - advised him to finish out his lisinopril, skip one day and then begin entresto 24/26mg  twice daily. Voucher given for him to get the first 30 days free of the medication - - has not gotten established with cardiology yet and prefers [facility] so that he can have cardiology, pulmonology and PCP in the same building - BMP 02/27/2018 reviewed and showed sodium 134, potassium 3.9, creatinine 0.93 and GFR >60 - BNP 02/24/2018 was 553.0 - does not get a flu vaccine; good handwashing encouraged  2: COPD- - lungs clear today [facility] pulmonology contact information given to patient  3: Obstructive sleep apnea- - wearing CPAP nightly - sleeping well other than he goes through periods of insomnia  4: Lymphedema- - stage 2 - does wear compression socks on occasion; encouraged him to wear them daily with removal at bedtime - elevates his legs "some"; instructed to elevate his legs when sitting for long periods of time - limited in his ability to exercise due to chronic back pain - discussed lymphapress compression boots if edema persists  Patient did not bring his medications nor a list. Each medication was verbally reviewed with the patient and he was encouraged to bring the bottles to every visit to confirm accuracy of list.

## 2018-03-16 ENCOUNTER — Inpatient Hospital Stay
Admission: EM | Admit: 2018-03-16 | Discharge: 2018-03-25 | DRG: 871 | Disposition: A | Discharge status: Home Health | Payer: Medicaid Other | Attending: Internal Medicine | Admitting: Internal Medicine

## 2018-03-16 ENCOUNTER — Emergency Department (HOSPITAL_COMMUNITY): Admit: 2018-03-16 | Payer: Medicaid Other | Admitting: Interventional Cardiology

## 2018-03-16 ENCOUNTER — Ambulatory Visit: Payer: Medicaid - Out of State | Admitting: Family

## 2018-03-16 ENCOUNTER — Emergency Department: Payer: Medicaid Other

## 2018-03-16 ENCOUNTER — Encounter (HOSPITAL_COMMUNITY): Payer: Self-pay

## 2018-03-16 DIAGNOSIS — E662 Morbid (severe) obesity with alveolar hypoventilation: Secondary | ICD-10-CM | POA: Diagnosis present

## 2018-03-16 DIAGNOSIS — E1165 Type 2 diabetes mellitus with hyperglycemia: Secondary | ICD-10-CM | POA: Diagnosis present

## 2018-03-16 DIAGNOSIS — J449 Chronic obstructive pulmonary disease, unspecified: Secondary | ICD-10-CM | POA: Diagnosis present

## 2018-03-16 DIAGNOSIS — I5033 Acute on chronic diastolic (congestive) heart failure: Secondary | ICD-10-CM | POA: Diagnosis present

## 2018-03-16 DIAGNOSIS — T148XXA Other injury of unspecified body region, initial encounter: Secondary | ICD-10-CM

## 2018-03-16 DIAGNOSIS — L0390 Cellulitis, unspecified: Secondary | ICD-10-CM

## 2018-03-16 DIAGNOSIS — A419 Sepsis, unspecified organism: Secondary | ICD-10-CM | POA: Diagnosis not present

## 2018-03-16 DIAGNOSIS — I4819 Other persistent atrial fibrillation: Secondary | ICD-10-CM

## 2018-03-16 DIAGNOSIS — Z7982 Long term (current) use of aspirin: Secondary | ICD-10-CM

## 2018-03-16 DIAGNOSIS — I5023 Acute on chronic systolic (congestive) heart failure: Secondary | ICD-10-CM

## 2018-03-16 DIAGNOSIS — J9620 Acute and chronic respiratory failure, unspecified whether with hypoxia or hypercapnia: Secondary | ICD-10-CM

## 2018-03-16 DIAGNOSIS — L03116 Cellulitis of left lower limb: Secondary | ICD-10-CM

## 2018-03-16 DIAGNOSIS — R778 Other specified abnormalities of plasma proteins: Secondary | ICD-10-CM

## 2018-03-16 DIAGNOSIS — I447 Left bundle-branch block, unspecified: Secondary | ICD-10-CM | POA: Diagnosis present

## 2018-03-16 DIAGNOSIS — R6520 Severe sepsis without septic shock: Secondary | ICD-10-CM | POA: Diagnosis not present

## 2018-03-16 DIAGNOSIS — Z79899 Other long term (current) drug therapy: Secondary | ICD-10-CM

## 2018-03-16 DIAGNOSIS — Z7901 Long term (current) use of anticoagulants: Secondary | ICD-10-CM

## 2018-03-16 DIAGNOSIS — E872 Acidosis: Secondary | ICD-10-CM | POA: Diagnosis present

## 2018-03-16 DIAGNOSIS — J9601 Acute respiratory failure with hypoxia: Secondary | ICD-10-CM | POA: Diagnosis present

## 2018-03-16 DIAGNOSIS — R079 Chest pain, unspecified: Secondary | ICD-10-CM | POA: Diagnosis not present

## 2018-03-16 DIAGNOSIS — Z801 Family history of malignant neoplasm of trachea, bronchus and lung: Secondary | ICD-10-CM

## 2018-03-16 DIAGNOSIS — R7989 Other specified abnormal findings of blood chemistry: Secondary | ICD-10-CM

## 2018-03-16 DIAGNOSIS — L97929 Non-pressure chronic ulcer of unspecified part of left lower leg with unspecified severity: Secondary | ICD-10-CM | POA: Diagnosis present

## 2018-03-16 DIAGNOSIS — R0789 Other chest pain: Secondary | ICD-10-CM | POA: Diagnosis not present

## 2018-03-16 DIAGNOSIS — I5042 Chronic combined systolic (congestive) and diastolic (congestive) heart failure: Secondary | ICD-10-CM | POA: Diagnosis present

## 2018-03-16 DIAGNOSIS — I482 Chronic atrial fibrillation, unspecified: Secondary | ICD-10-CM

## 2018-03-16 DIAGNOSIS — Z8249 Family history of ischemic heart disease and other diseases of the circulatory system: Secondary | ICD-10-CM

## 2018-03-16 DIAGNOSIS — I509 Heart failure, unspecified: Secondary | ICD-10-CM

## 2018-03-16 DIAGNOSIS — I480 Paroxysmal atrial fibrillation: Secondary | ICD-10-CM | POA: Diagnosis present

## 2018-03-16 DIAGNOSIS — L089 Local infection of the skin and subcutaneous tissue, unspecified: Secondary | ICD-10-CM

## 2018-03-16 DIAGNOSIS — I5022 Chronic systolic (congestive) heart failure: Secondary | ICD-10-CM | POA: Diagnosis present

## 2018-03-16 LAB — BASIC METABOLIC PANEL
ANION GAP: 11 (ref 5–15)
BUN: 8 mg/dL (ref 6–20)
CO2: 24 mmol/L (ref 22–32)
Calcium: 8.1 mg/dL — ABNORMAL LOW (ref 8.9–10.3)
Chloride: 101 mmol/L (ref 98–111)
Creatinine, Ser: 0.86 mg/dL (ref 0.61–1.24)
GFR calc Af Amer: >60 mL/min (ref >60)
Glucose, Bld: 150 mg/dL — ABNORMAL HIGH (ref 70–99)
Potassium: 3.5 mmol/L (ref 3.5–5.1)
Sodium: 136 mmol/L (ref 135–145)

## 2018-03-16 LAB — LACTIC ACID, PLASMA: Lactic Acid, Venous: 3 mmol/L (ref 0.5–1.9)

## 2018-03-16 LAB — HEPATIC FUNCTION PANEL
ALT: 16 U/L (ref 0–44)
AST: 27 U/L (ref 15–41)
Albumin: 3.3 g/dL — ABNORMAL LOW (ref 3.5–5.0)
Alkaline Phosphatase: 56 U/L (ref 38–126)
Bilirubin, Direct: 0.1 mg/dL (ref 0.0–0.2)
Indirect Bilirubin: 0.7 mg/dL (ref 0.3–0.9)
Total Bilirubin: 0.8 mg/dL (ref 0.3–1.2)
Total Protein: 6.5 g/dL (ref 6.5–8.1)

## 2018-03-16 LAB — PROTIME-INR
INR: 1.05
Prothrombin Time: 13.6 seconds (ref 11.4–15.2)

## 2018-03-16 LAB — CBC
HCT: 34.3 % — ABNORMAL LOW (ref 39.0–52.0)
Hemoglobin: 10.4 g/dL — ABNORMAL LOW (ref 13.0–17.0)
MCH: 24.8 pg — ABNORMAL LOW (ref 26.0–34.0)
MCHC: 30.3 g/dL (ref 30.0–36.0)
MCV: 81.9 fL (ref 80.0–100.0)
PLATELETS: 215 10*3/uL (ref 150–400)
RBC: 4.19 MIL/uL — ABNORMAL LOW (ref 4.22–5.81)
RDW: 20 % — ABNORMAL HIGH (ref 11.5–15.5)
WBC: 7 10*3/uL (ref 4.0–10.5)
nRBC: 0 % (ref 0.0–0.2)

## 2018-03-16 LAB — TROPONIN I: TROPONIN I: 0.04 ng/mL — AB (ref <0.03)

## 2018-03-16 LAB — LIPASE, BLOOD: Lipase: 31 U/L (ref 11–51)

## 2018-03-16 LAB — BRAIN NATRIURETIC PEPTIDE: B NATRIURETIC PEPTIDE 5: 257 pg/mL — AB (ref 0.0–100.0)

## 2018-03-16 SURGERY — CORONARY/GRAFT ACUTE MI REVASCULARIZATION
Anesthesia: LOCAL

## 2018-03-16 MED ORDER — VANCOMYCIN HCL 10 G IV SOLR
2000.0000 mg | Freq: Once | INTRAVENOUS | Status: AC
  Administered 2018-03-17: 2000 mg via INTRAVENOUS
  Filled 2018-03-16: qty 2000

## 2018-03-16 MED ORDER — SODIUM CHLORIDE 0.9% FLUSH: 3.0000 mL | Freq: Once | INTRAVENOUS | Status: DC

## 2018-03-16 MED ORDER — SODIUM CHLORIDE 0.9 % IV SOLN
2.0000 g | INTRAVENOUS | Status: DC
  Administered 2018-03-16: 2 g via INTRAVENOUS
  Filled 2018-03-16: qty 20

## 2018-03-16 MED ORDER — ONDANSETRON HCL 4 MG/2ML IJ SOLN
4.0000 mg | Freq: Once | INTRAMUSCULAR | Status: AC
  Administered 2018-03-16: 4 mg via INTRAVENOUS

## 2018-03-16 MED ORDER — VANCOMYCIN HCL IN DEXTROSE 1-5 GM/200ML-% IV SOLN: 1000.0000 mg | Freq: Once | INTRAVENOUS | Status: DC

## 2018-03-16 MED ORDER — ONDANSETRON HCL 4 MG/2ML IJ SOLN
INTRAMUSCULAR | Status: AC
  Administered 2018-03-16: 4 mg via INTRAVENOUS
  Filled 2018-03-16: qty 2

## 2018-03-16 NOTE — ED Triage Notes (Signed)
Patient coming [facility] from home for N/V, and left chest pain. Patient reports hx of CHF, COPD and previous MI.  Patient given 325mg  Aspirin in transport. Patient given 4mg  zofran in transport.

## 2018-03-16 NOTE — ED Notes (Signed)
Patient's oxygen saturation level 92% on RA. Patient placed 2L [place]. Will continue to monitor

## 2018-03-16 NOTE — ED Notes (Signed)
Xray tech at bedside.

## 2018-03-16 NOTE — ED Provider Notes (Signed)
[facility] Emergency Department Provider Note  ____________________________________________   First MD Initiated Contact with Patient 03/16/18 2304     (approximate)  I have reviewed the triage vital signs and the nursing notes.   HISTORY  Chief Complaint Chest Pain    HPI [name] [name] is a 58 y.o. male with extensive chronic medical history that includes morbid obesity, COPD without home oxygen requirement, atrial fibrillation (at least paroxysmal), and CHF with an estimated ejection fraction of 30%.  He presents by EMS for chest pain and shortness of breath.  He was initially called as a possible code STEMI but the ED doctor at the time reviewed the EKG and prior EKGs and determined that there is no significant change from prior.  Upon arrival the patient is febrile, short of breath, with some mild chest pain, but his primary concern is the shortness of breath.  He also has a large wound on his left lower leg that is red and inflamed.  He states that he was in his usual state of health until about 4:00 this afternoon (about 8 hours ago) when he began to feel ill, short of breath, and started having shaking chills.  He was in some respiratory distress when paramedics arrived and they put him on 3 L of oxygen for comfort.  Off of oxygen in the emergency department he dropped to the low 90s so he was placed back on oxygen for comfort, and he states that it does make him feel better.  He is currently having rigors during my conversation.  He reports mild chest pain and pressure throughout.  He reports pain in the left lower leg around the site of the wound.  He is having some generalized abdominal pain as well as nausea and multiple episodes of emesis.  He describes the symptoms as severe, made a little bit better with oxygen, made worse with any exertion.  He received aspirin 324 mg by mouth prior to arrival as well as Zofran 4 mg for his nausea.  Past Medical  History:  Diagnosis Date  . A-fib (HCC)   . Asthma   . CHF (congestive heart failure) (HCC)   . COPD (chronic obstructive pulmonary disease) (HCC)   . Sleep apnea     Patient Active Problem List   Diagnosis Date Noted  . Sepsis (HCC) 03/17/2018  . Cellulitis 03/17/2018  . Chest pain 02/23/2018  . Acute on chronic systolic CHF (congestive heart failure) (HCC) 02/02/2018  . COPD (chronic obstructive pulmonary disease) (HCC) 02/02/2018  . Obstructive sleep apnea 02/02/2018  . Lymphedema 02/02/2018  . Acute respiratory failure with hypoxia (HCC) 01/27/2018  . Dyspnea   . SOB (shortness of breath)   . Acute respiratory failure (HCC) 01/16/2018    Past Surgical History:  Procedure Laterality Date  . hearnia repair    . LEG SURGERY      Prior to Admission medications   Medication Sig Start Date End Date Taking? Authorizing Provider  albuterol (PROVENTIL HFA;VENTOLIN HFA) 108 (90 Base) MCG/ACT inhaler Inhale 2 puffs into the lungs every 6 (six) hours as needed for wheezing or shortness of breath. 01/29/18  Yes Milagros Loll, MD  aspirin 325 MG tablet Take 325 mg by mouth daily.   Yes [provider]  metoprolol tartrate (LOPRESSOR) 50 MG tablet Take 2 tablets (100 mg total) by mouth 2 (two) times daily. 01/19/18  Yes Auburn Bilberry, MD  nitroGLYCERIN (NITROSTAT) 0.4 MG SL tablet Place 1 tablet (0.4 mg  total) under the tongue every 5 (five) minutes as needed for chest pain. 01/18/18  Yes Auburn BilberryPatel, Shreyang, MD  potassium chloride (K-DUR) 10 MEQ tablet Take 1 tablet (10 mEq total) by mouth 2 (two) times daily. 02/27/18  Yes Sudini, Wardell HeathSrikar, MD  rivaroxaban (XARELTO) 20 MG TABS tablet Take 1 tablet (20 mg total) by mouth daily. 01/18/18  Yes Auburn BilberryPatel, Shreyang, MD  sacubitril-valsartan (ENTRESTO) 24-26 MG Take 1 tablet by mouth 2 (two) times daily. D/C lisinopril 02/02/18  Yes Clarisa KindredHackney, Tina A, FNP  torsemide (DEMADEX) 20 MG tablet Take 3 tablets (60 mg total) by mouth 2 (two) times daily.  02/27/18  Yes Milagros LollSudini, Srikar, MD  traZODone (DESYREL) 150 MG tablet Take 1 tablet (150 mg total) by mouth at bedtime as needed for sleep. 01/18/18  Yes Auburn BilberryPatel, Shreyang, MD    Allergies Patient has no known allergies.  Family History  Problem Relation Age of Onset  . Heart failure Mother   . Lung cancer Mother   . Lung cancer Father     Social History Social History   Tobacco Use  . Smoking status: Never Smoker  . Smokeless tobacco: Never Used  Substance Use Topics  . Alcohol use: Never    Frequency: Never  . Drug use: Never    Review of Systems Constitutional: +fever/chills Eyes: No visual changes. ENT: No sore throat. Cardiovascular: +chest pain. Respiratory: +shortness of breath. Gastrointestinal: Generalized abdominal pain with nausea and vomiting, now improved Genitourinary: Negative for dysuria. Musculoskeletal: Negative for neck pain.  Negative for back pain. Integumentary: Wound on left lower leg Neurological: Negative for headaches, focal weakness or numbness.   ____________________________________________   PHYSICAL EXAM:  VITAL SIGNS: ED Triage Vitals  Enc Vitals Group     BP 03/16/18 2246 129/77     Pulse Rate 03/16/18 2246 (!) 110     Resp 03/16/18 2246 (!) 22     Temp 03/16/18 2246 (!) 100.6 F (38.1 C)     Temp Source 03/16/18 2246 Oral     SpO2 03/16/18 2246 95 %     Weight 03/16/18 2244 (!) 175 kg (385 lb 12.9 oz)     Height 03/16/18 2244 1.727 m (5\' 8" )     Head Circumference --      Peak Flow --      Pain Score 03/16/18 2246 7     Pain Loc --      Pain Edu? --      Excl. in GC? --     Constitutional: Alert and oriented.  Ill-appearing, both chronically and acutely. Eyes: Conjunctivae are normal.  Head: Atraumatic. Nose: Mild congestion/rhinnorhea. Mouth/Throat: Mucous membranes are moist. Neck: No stridor.  No meningeal signs.   Cardiovascular: Tachycardia, regular rhythm. Good peripheral circulation. Grossly normal heart  sounds. Respiratory: Increased respiratory rate and effort.  Accessory muscle usage with intercostal muscle retractions.  No wheezing, some coarse breath sounds in the bases. Gastrointestinal: Morbid obesity. Soft with mild diffuse tenderness throughout. Musculoskeletal: No lower extremity tenderness nor edema. No gross deformities of extremities. Neurologic:  Normal speech and language. No gross focal neurologic deficits are appreciated.  Skin:  Skin is warm, dry.  Large open but relatively superficial wound to left lower leg with surrounding erythema consistent with cellulitis.  Wound culture is being obtained. Psychiatric: Mood and affect are normal. Speech and behavior are normal.  ____________________________________________   LABS (all labs ordered are listed, but only abnormal results are displayed)  Labs Reviewed  BASIC METABOLIC PANEL -  Abnormal; Notable for the following components:      Result Value   Glucose, Bld 150 (*)    Calcium 8.1 (*)    All other components within normal limits  CBC - Abnormal; Notable for the following components:   RBC 4.19 (*)    Hemoglobin 10.4 (*)    HCT 34.3 (*)    MCH 24.8 (*)    RDW 20.0 (*)    All other components within normal limits  TROPONIN I - Abnormal; Notable for the following components:   Troponin I 0.04 (*)    All other components within normal limits  BRAIN NATRIURETIC PEPTIDE - Abnormal; Notable for the following components:   B Natriuretic Peptide 257.0 (*)    All other components within normal limits  LACTIC ACID, PLASMA - Abnormal; Notable for the following components:   Lactic Acid, Venous 3.0 (*)    All other components within normal limits  LACTIC ACID, PLASMA - Abnormal; Notable for the following components:   Lactic Acid, Venous 2.1 (*)    All other components within normal limits  HEPATIC FUNCTION PANEL - Abnormal; Notable for the following components:   Albumin 3.3 (*)    All other components within normal  limits  GLUCOSE, CAPILLARY - Abnormal; Notable for the following components:   Glucose-Capillary 114 (*)    All other components within normal limits  CULTURE, BLOOD (ROUTINE X 2)  CULTURE, BLOOD (ROUTINE X 2)  URINE CULTURE  AEROBIC CULTURE (SUPERFICIAL SPECIMEN)  LIPASE, BLOOD  PROCALCITONIN  PROTIME-INR  INFLUENZA PANEL BY PCR (TYPE A & B)  URINALYSIS, COMPLETE (UACMP) WITH MICROSCOPIC  LACTIC ACID, PLASMA  HEMOGLOBIN A1C   ____________________________________________  EKG  ED ECG REPORT I, Loleta Roseory Mckenze Slone, the attending physician, personally viewed and interpreted this ECG.  Date: 03/16/2018 EKG Time: 22: 44 Rate: 119 Rhythm: Ventricular paced complexes versus atrial fibrillation QRS Axis: normal Intervals: Essentially normal other than what appears to be A. fib with no consistent PR interval.  Appears consistent with left bundle branch. ST/T Wave abnormalities: Non-specific ST segment / T-wave changes, but no clear evidence of acute ischemia. Narrative Interpretation: no definitive evidence of acute ischemia; does not meet STEMI criteria.  No significant change from prior EKG.   ____________________________________________  RADIOLOGY Marylou MccoyI, Serita Degroote, personally viewed and evaluated these images (plain radiographs) as part of my medical decision making, as well as reviewing the written report by the radiologist.  ED MD interpretation: Cardiomegaly with vascular congestion, no obvious pneumonia.  Official radiology report(s): Dg Chest Portable 1 View  Result Date: 03/16/2018 CLINICAL DATA:  Chest pain, nausea, vomiting EXAM: PORTABLE CHEST 1 VIEW COMPARISON:  02/24/2018 FINDINGS: Cardiomegaly with vascular congestion. No overt edema. No confluent opacity or effusion. No acute bony abnormality. IMPRESSION: Cardiomegaly with vascular congestion. Electronically Signed   By: Charlett NoseKevin  Dover M.D.   On: 03/16/2018 23:03     ____________________________________________   PROCEDURES  Critical Care performed: Yes, see critical care procedure note(s)   Procedure(s) performed:   .Critical Care Performed by: Loleta RoseForbach, Cinde Ebert, MD Authorized by: Loleta RoseForbach, Dannetta Lekas, MD   Critical care provider statement:    Critical care time (minutes):  30   Critical care time was exclusive of:  Separately billable procedures and treating other patients   Critical care was necessary to treat or prevent imminent or life-threatening deterioration of the following conditions:  Sepsis   Critical care was time spent personally by me on the following activities:  Development of treatment plan with  patient or surrogate, discussions with consultants, evaluation of patient's response to treatment, examination of patient, obtaining history from patient or surrogate, ordering and performing treatments and interventions, ordering and review of laboratory studies, ordering and review of radiographic studies, pulse oximetry, re-evaluation of patient's condition and review of old charts     ____________________________________________   INITIAL IMPRESSION / ASSESSMENT AND PLAN / ED COURSE  As part of my medical decision making, I reviewed the following data within the electronic MEDICAL RECORD NUMBER Nursing notes reviewed and incorporated, Labs reviewed , EKG interpreted , Old EKG reviewed, Old chart reviewed, Radiograph reviewed , Discussed with admitting physician  and Notes from prior ED visits    Differential diagnosis includes, but is not limited to, sepsis, cellulitis, pneumonia, urinary tract infection, bacteremia, COPD exacerbation, CHF exacerbation, ACS, PE.  The patient has multiple chronic medical issues, all of which could result an emergent or life-threatening acute conditions.  His EKG is reassuring and his primary concern is the shortness of breath but he is having shaking chills/rigors at this time and is febrile.  If no other source  is identified, I believe that he likely is septic from the left leg wound, but it is certainly possible that he has pneumonia as well.  His chest x-ray is already back and demonstrates cardiomegaly with vascular congestion and no obvious pneumonia but he is certainly at risk having been in the hospital recently and based on his body habitus and comorbidities.  I doubt intra-abdominal infection based on the relatively benign physical exam and the fact that he was not having abdominal pain until after he was vomiting several times which I believe is a result of the acute infection.  Influenza PCR is pending, as is the rest of the code sepsis work-up, but I have made him code sepsis and will treat empirically with broad-spectrum antibiotics but targeted mostly towards cellulitis.  Given his history of CHF and the pulmonary vascular congestion seen on chest x-ray, I will give a small fluid bolus but I am very concerned about fluid overload.  Unless he demonstrates signs of severe sepsis or septic shock I will not plan on targeting a goal of 30 mL/kg even based on ideal body weight because I think he is at high risk of developing pulmonary edema and requiring intubation from which he would have a hard time recovering and being weaned.  I reviewed the medical record and he was discharged just 2 weeks ago after admission for CHF exacerbation where he required BiPAP for his pulmonary edema.  It is well documented that he is noncompliant with his dietary recommendations and that salt load likely contributes greatly to his CHF exacerbations.  Clinical Course as of Mar 17 321  Thu Mar 16, 2018  2301 Temp(!): 100.6 F (38.1 C) [CF]  2304 WBC: 7.0 [CF]  2332 BMP is within normal limits, troponin is slightly elevated at 0.04, BNP is slightly elevated to 57, CBC is essentially normal with no leukocytosis.   [CF]  Fri Mar 17, 2018  0000 The patient's lactic acid is 3.0.  I do suspect this is representative of sepsis, but  he is normotensive and his lactic acid is not greater than 4.  Again I will reiterate that I am very concerned about volume overload in this patient who is already demonstrating signs of pulmonary vascular congestion and has a well-documented history of pulmonary edema and volume overload.  I will discussed the case with the hospitalist but right now  I am giving him 500 mL of fluid only in addition to the carrier fluids present with the antibiotics.   [CF]  0004 I discussed the case in person with Dr. Elisabeth Pigeon with the hospitalist service who will admit.  Procalcitonin and urine studies are still pending.   [CF]  0024 Negative influenza  Influenza panel by PCR (type A & B) [CF]  0024 reassuring procalcitonin, supporting my belief that the patient does not need aggressive fluid resuscitation  Procalcitonin: <0.10 [CF]    Clinical Course User Index [CF] Loleta Rose, MD    ____________________________________________  FINAL CLINICAL IMPRESSION(S) / ED DIAGNOSES  Final diagnoses:  Sepsis, due to unspecified organism, unspecified whether acute organ dysfunction present (HCC)  Acute on chronic respiratory failure, unspecified whether with hypoxia or hypercapnia (HCC)  Chest pain, unspecified type  Elevated troponin I level  Wound infection  Cellulitis of left lower extremity     MEDICATIONS GIVEN DURING THIS VISIT:  Medications  aspirin EC tablet 325 mg (has no administration in time range)  metoprolol tartrate (LOPRESSOR) tablet 100 mg (100 mg Oral Not Given 03/17/18 0213)  nitroGLYCERIN (NITROSTAT) SL tablet 0.4 mg (has no administration in time range)  sacubitril-valsartan (ENTRESTO) 24-26 mg per tablet (1 tablet Oral Not Given 03/17/18 0214)  traZODone (DESYREL) tablet 150 mg (has no administration in time range)  rivaroxaban (XARELTO) tablet 20 mg (has no administration in time range)  albuterol (PROVENTIL) (2.5 MG/3ML) 0.083% nebulizer solution 2.5 mg (has no administration in  time range)  furosemide (LASIX) injection 20 mg (20 mg Intravenous Given 03/17/18 0218)  insulin aspart (novoLOG) injection 0-9 Units (has no administration in time range)  insulin aspart (novoLOG) injection 0-5 Units (0 Units Subcutaneous Not Given 03/17/18 0150)  potassium chloride SA (K-DUR,KLOR-CON) CR tablet 20 mEq (20 mEq Oral Given 03/17/18 0247)  Ampicillin-Sulbactam (UNASYN) 3 g in sodium chloride 0.9 % 100 mL IVPB (3 g Intravenous New Bag/Given 03/17/18 0219)  vancomycin (VANCOCIN) 1,250 mg in sodium chloride 0.9 % 250 mL IVPB (has no administration in time range)  ondansetron (ZOFRAN) injection 4 mg (4 mg Intravenous Given 03/16/18 2335)  vancomycin (VANCOCIN) 2,000 mg in sodium chloride 0.9 % 500 mL IVPB (2,000 mg Intravenous New Bag/Given 03/17/18 0020)  metoCLOPramide (REGLAN) injection 5 mg (5 mg Intravenous Given 03/17/18 0247)     ED Discharge Orders    None       Note:  This document was prepared using Dragon voice recognition software and may include unintentional dictation errors.   Loleta Rose, MD 03/17/18 [phone]

## 2018-03-17 DIAGNOSIS — R6520 Severe sepsis without septic shock: Secondary | ICD-10-CM | POA: Diagnosis not present

## 2018-03-17 DIAGNOSIS — L03116 Cellulitis of left lower limb: Secondary | ICD-10-CM | POA: Diagnosis present

## 2018-03-17 DIAGNOSIS — L0390 Cellulitis, unspecified: Secondary | ICD-10-CM | POA: Diagnosis not present

## 2018-03-17 DIAGNOSIS — G4733 Obstructive sleep apnea (adult) (pediatric): Secondary | ICD-10-CM | POA: Diagnosis not present

## 2018-03-17 DIAGNOSIS — I482 Chronic atrial fibrillation, unspecified: Secondary | ICD-10-CM | POA: Diagnosis not present

## 2018-03-17 DIAGNOSIS — Z79899 Other long term (current) drug therapy: Secondary | ICD-10-CM | POA: Diagnosis not present

## 2018-03-17 DIAGNOSIS — A419 Sepsis, unspecified organism: Secondary | ICD-10-CM | POA: Diagnosis not present

## 2018-03-17 DIAGNOSIS — I10 Essential (primary) hypertension: Secondary | ICD-10-CM | POA: Diagnosis not present

## 2018-03-17 DIAGNOSIS — I4819 Other persistent atrial fibrillation: Secondary | ICD-10-CM | POA: Diagnosis not present

## 2018-03-17 DIAGNOSIS — Z801 Family history of malignant neoplasm of trachea, bronchus and lung: Secondary | ICD-10-CM | POA: Diagnosis not present

## 2018-03-17 DIAGNOSIS — J449 Chronic obstructive pulmonary disease, unspecified: Secondary | ICD-10-CM | POA: Diagnosis not present

## 2018-03-17 DIAGNOSIS — R0789 Other chest pain: Secondary | ICD-10-CM | POA: Diagnosis not present

## 2018-03-17 DIAGNOSIS — J9601 Acute respiratory failure with hypoxia: Secondary | ICD-10-CM | POA: Diagnosis present

## 2018-03-17 DIAGNOSIS — I5023 Acute on chronic systolic (congestive) heart failure: Secondary | ICD-10-CM | POA: Diagnosis not present

## 2018-03-17 DIAGNOSIS — R0602 Shortness of breath: Secondary | ICD-10-CM | POA: Diagnosis not present

## 2018-03-17 DIAGNOSIS — E1165 Type 2 diabetes mellitus with hyperglycemia: Secondary | ICD-10-CM | POA: Diagnosis present

## 2018-03-17 DIAGNOSIS — R079 Chest pain, unspecified: Secondary | ICD-10-CM | POA: Diagnosis not present

## 2018-03-17 DIAGNOSIS — R0902 Hypoxemia: Secondary | ICD-10-CM | POA: Diagnosis not present

## 2018-03-17 DIAGNOSIS — I509 Heart failure, unspecified: Secondary | ICD-10-CM | POA: Diagnosis not present

## 2018-03-17 DIAGNOSIS — I213 ST elevation (STEMI) myocardial infarction of unspecified site: Secondary | ICD-10-CM | POA: Diagnosis not present

## 2018-03-17 DIAGNOSIS — I447 Left bundle-branch block, unspecified: Secondary | ICD-10-CM | POA: Diagnosis present

## 2018-03-17 DIAGNOSIS — Z8249 Family history of ischemic heart disease and other diseases of the circulatory system: Secondary | ICD-10-CM | POA: Diagnosis not present

## 2018-03-17 DIAGNOSIS — I4891 Unspecified atrial fibrillation: Secondary | ICD-10-CM | POA: Diagnosis not present

## 2018-03-17 DIAGNOSIS — J9620 Acute and chronic respiratory failure, unspecified whether with hypoxia or hypercapnia: Secondary | ICD-10-CM | POA: Diagnosis not present

## 2018-03-17 DIAGNOSIS — L97929 Non-pressure chronic ulcer of unspecified part of left lower leg with unspecified severity: Secondary | ICD-10-CM | POA: Diagnosis present

## 2018-03-17 DIAGNOSIS — E872 Acidosis: Secondary | ICD-10-CM | POA: Diagnosis present

## 2018-03-17 DIAGNOSIS — E662 Morbid (severe) obesity with alveolar hypoventilation: Secondary | ICD-10-CM | POA: Diagnosis present

## 2018-03-17 DIAGNOSIS — Z7901 Long term (current) use of anticoagulants: Secondary | ICD-10-CM | POA: Diagnosis not present

## 2018-03-17 DIAGNOSIS — I480 Paroxysmal atrial fibrillation: Secondary | ICD-10-CM | POA: Diagnosis present

## 2018-03-17 DIAGNOSIS — Z7982 Long term (current) use of aspirin: Secondary | ICD-10-CM | POA: Diagnosis not present

## 2018-03-17 LAB — GLUCOSE, CAPILLARY
GLUCOSE-CAPILLARY: 99 mg/dL (ref 70–99)
GLUCOSE-CAPILLARY: 93 mg/dL (ref 70–99)
Glucose-Capillary: 114 mg/dL — ABNORMAL HIGH (ref 70–99)
Glucose-Capillary: 94 mg/dL (ref 70–99)
Glucose-Capillary: 145 mg/dL — ABNORMAL HIGH (ref 70–99)

## 2018-03-17 LAB — URINALYSIS, COMPLETE (UACMP) WITH MICROSCOPIC
Bacteria, UA: NONE SEEN
Bilirubin Urine: NEGATIVE
GLUCOSE, UA: NEGATIVE mg/dL
Ketones, ur: NEGATIVE mg/dL
Nitrite: NEGATIVE
PROTEIN: 30 mg/dL — AB
Specific Gravity, Urine: 1.017 (ref 1.005–1.030)
pH: 8 (ref 5.0–8.0)

## 2018-03-17 LAB — HEMOGLOBIN A1C
Hgb A1c MFr Bld: 6.9 % — ABNORMAL HIGH (ref 4.8–5.6)
Mean Plasma Glucose: 151.33 mg/dL

## 2018-03-17 LAB — PROCALCITONIN: Procalcitonin: <0.1 ng/mL

## 2018-03-17 LAB — LACTIC ACID, PLASMA
Lactic Acid, Venous: 2.1 mmol/L (ref 0.5–1.9)
Lactic Acid, Venous: 1.9 mmol/L (ref 0.5–1.9)

## 2018-03-17 LAB — INFLUENZA PANEL BY PCR (TYPE A & B)
Influenza A By PCR: NEGATIVE
Influenza B By PCR: NEGATIVE

## 2018-03-17 MED ORDER — POTASSIUM CHLORIDE ER 10 MEQ PO TBCR: 10.0000 meq | EXTENDED_RELEASE_TABLET | Freq: Two times a day (BID) | ORAL | Status: DC

## 2018-03-17 MED ORDER — RIVAROXABAN 20 MG PO TABS
20.0000 mg | ORAL_TABLET | Freq: Every day | ORAL | Status: DC
  Administered 2018-03-17 – 2018-03-24 (×8): 20 mg via ORAL
  Filled 2018-03-17 (×8): qty 1

## 2018-03-17 MED ORDER — INSULIN ASPART 100 UNIT/ML ~~LOC~~ SOLN
0.0000 [IU] | Freq: Three times a day (TID) | SUBCUTANEOUS | Status: DC
  Administered 2018-03-18 (×2): 1 [IU] via SUBCUTANEOUS
  Filled 2018-03-17 (×2): qty 1

## 2018-03-17 MED ORDER — METOPROLOL TARTRATE 50 MG PO TABS
100.0000 mg | ORAL_TABLET | Freq: Two times a day (BID) | ORAL | Status: DC
  Administered 2018-03-17: 100 mg via ORAL
  Filled 2018-03-17: qty 2

## 2018-03-17 MED ORDER — POTASSIUM CHLORIDE CRYS ER 20 MEQ PO TBCR
20.0000 meq | EXTENDED_RELEASE_TABLET | Freq: Two times a day (BID) | ORAL | Status: DC
  Administered 2018-03-17 – 2018-03-25 (×18): 20 meq via ORAL
  Filled 2018-03-17 (×18): qty 1

## 2018-03-17 MED ORDER — TORSEMIDE 20 MG PO TABS: 20.0000 mg | ORAL_TABLET | Freq: Every day | ORAL | Status: DC

## 2018-03-17 MED ORDER — FUROSEMIDE 10 MG/ML IJ SOLN
20.0000 mg | Freq: Two times a day (BID) | INTRAMUSCULAR | Status: DC
  Administered 2018-03-17: 20 mg via INTRAVENOUS
  Filled 2018-03-17: qty 2

## 2018-03-17 MED ORDER — BISACODYL 5 MG PO TBEC: 5.0000 mg | DELAYED_RELEASE_TABLET | Freq: Every day | ORAL | Status: DC | PRN

## 2018-03-17 MED ORDER — NITROGLYCERIN 0.4 MG SL SUBL: 0.4000 mg | SUBLINGUAL_TABLET | SUBLINGUAL | Status: DC | PRN

## 2018-03-17 MED ORDER — ASPIRIN EC 325 MG PO TBEC
325.0000 mg | DELAYED_RELEASE_TABLET | Freq: Every day | ORAL | Status: DC
  Administered 2018-03-17 – 2018-03-18 (×2): 325 mg via ORAL
  Filled 2018-03-17 (×2): qty 1

## 2018-03-17 MED ORDER — ONDANSETRON HCL 4 MG/2ML IJ SOLN: 4.0000 mg | Freq: Four times a day (QID) | INTRAMUSCULAR | Status: DC | PRN

## 2018-03-17 MED ORDER — ALBUTEROL SULFATE (2.5 MG/3ML) 0.083% IN NEBU: 2.5000 mg | INHALATION_SOLUTION | Freq: Four times a day (QID) | RESPIRATORY_TRACT | Status: DC | PRN

## 2018-03-17 MED ORDER — SENNA 8.6 MG PO TABS: 1.0000 | ORAL_TABLET | Freq: Every day | ORAL | Status: DC | PRN

## 2018-03-17 MED ORDER — SACUBITRIL-VALSARTAN 24-26 MG PO TABS
1.0000 | ORAL_TABLET | Freq: Two times a day (BID) | ORAL | Status: DC
  Administered 2018-03-17 – 2018-03-25 (×14): 1 via ORAL
  Filled 2018-03-17 (×17): qty 1

## 2018-03-17 MED ORDER — FUROSEMIDE 10 MG/ML IJ SOLN
20.0000 mg | Freq: Two times a day (BID) | INTRAMUSCULAR | Status: DC
  Administered 2018-03-18: 20 mg via INTRAVENOUS
  Filled 2018-03-17: qty 2

## 2018-03-17 MED ORDER — INSULIN ASPART 100 UNIT/ML ~~LOC~~ SOLN: 0.0000 [IU] | Freq: Every day | SUBCUTANEOUS | Status: DC

## 2018-03-17 MED ORDER — METOPROLOL TARTRATE 50 MG PO TABS
50.0000 mg | ORAL_TABLET | Freq: Two times a day (BID) | ORAL | Status: DC
  Administered 2018-03-17 – 2018-03-20 (×4): 50 mg via ORAL
  Filled 2018-03-17 (×7): qty 1

## 2018-03-17 MED ORDER — VANCOMYCIN HCL 10 G IV SOLR
1250.0000 mg | Freq: Three times a day (TID) | INTRAVENOUS | Status: DC
  Administered 2018-03-17: 1250 mg via INTRAVENOUS
  Filled 2018-03-17 (×3): qty 1250

## 2018-03-17 MED ORDER — METOCLOPRAMIDE HCL 5 MG/ML IJ SOLN
5.0000 mg | Freq: Once | INTRAMUSCULAR | Status: AC
  Administered 2018-03-17: 5 mg via INTRAVENOUS
  Filled 2018-03-17: qty 2

## 2018-03-17 MED ORDER — SODIUM CHLORIDE 0.9 % IV SOLN
3.0000 g | Freq: Four times a day (QID) | INTRAVENOUS | Status: DC
  Administered 2018-03-17 – 2018-03-21 (×18): 3 g via INTRAVENOUS
  Filled 2018-03-17 (×22): qty 3

## 2018-03-17 MED ORDER — ACETAMINOPHEN 325 MG PO TABS
650.0000 mg | ORAL_TABLET | Freq: Four times a day (QID) | ORAL | Status: DC | PRN
  Administered 2018-03-17 – 2018-03-18 (×2): 650 mg via ORAL
  Filled 2018-03-17 (×3): qty 2

## 2018-03-17 MED ORDER — TRAZODONE HCL 50 MG PO TABS
150.0000 mg | ORAL_TABLET | Freq: Every evening | ORAL | Status: DC | PRN
  Administered 2018-03-18 – 2018-03-24 (×5): 150 mg via ORAL
  Filled 2018-03-17 (×5): qty 1

## 2018-03-17 NOTE — H&P (Signed)
[facility] [place] [facility]   PATIENT NAME: [name] [name]    MR#:  [id number]  DATE OF BIRTH:  1960-08-07  DATE OF ADMISSION:  03/16/2018  PRIMARY CARE PHYSICIAN: Patient, No Pcp Per   REQUESTING/REFERRING PHYSICIAN: York CeriseForbach  CHIEF COMPLAINT:   Chief Complaint  Patient presents with  . Chest Pain    HISTORY OF PRESENT ILLNESS: [name] [name]  is a 58 y.o. male with a known history of A. fib, asthma, systolic CHF, COPD, sleep apnea-was recently admitted with CHF.  At home he had worsening swelling on his legs for last few days and he had a big blister on his left leg which busted few days ago and since then there is some redness surrounding there and it is hurting him.  He also complains of getting short of breath with minimal exertion. In emergency room he is noted to be tachycardic, high lactic acid, redness surrounding his also on the left leg, pulmonary edema on chest x-ray. Given to hospitalist team for admission for sepsis.  PAST MEDICAL HISTORY:   Past Medical History:  Diagnosis Date  . A-fib (HCC)   . Asthma   . CHF (congestive heart failure) (HCC)   . COPD (chronic obstructive pulmonary disease) (HCC)   . Sleep apnea     PAST SURGICAL HISTORY:  Past Surgical History:  Procedure Laterality Date  . hearnia repair    . LEG SURGERY      SOCIAL HISTORY:  Social History   Tobacco Use  . Smoking status: Never Smoker  . Smokeless tobacco: Never Used  Substance Use Topics  . Alcohol use: Never    Frequency: Never    FAMILY HISTORY:  Family History  Problem Relation Age of Onset  . Heart failure Mother   . Lung cancer Mother   . Lung cancer Father     DRUG ALLERGIES: No Known Allergies  REVIEW OF SYSTEMS:   CONSTITUTIONAL: No fever, fatigue or weakness.  EYES: No blurred or double vision.  EARS, NOSE, AND THROAT: No tinnitus or ear pain.  RESPIRATORY: No cough, have shortness of breath, no wheezing or hemoptysis.   CARDIOVASCULAR: No chest pain, orthopnea, have edema.  GASTROINTESTINAL: No nausea, vomiting, diarrhea or abdominal pain.  GENITOURINARY: No dysuria, hematuria.  ENDOCRINE: No polyuria, nocturia,  HEMATOLOGY: No anemia, easy bruising or bleeding SKIN: No rash or lesion. MUSCULOSKELETAL: No joint pain or arthritis.   NEUROLOGIC: No tingling, numbness, weakness.  PSYCHIATRY: No anxiety or depression.   MEDICATIONS AT HOME:  Prior to Admission medications   Medication Sig Start Date End Date Taking? Authorizing Provider  albuterol (PROVENTIL HFA;VENTOLIN HFA) 108 (90 Base) MCG/ACT inhaler Inhale 2 puffs into the lungs every 6 (six) hours as needed for wheezing or shortness of breath. 01/29/18  Yes Milagros LollSudini, Srikar, MD  aspirin 325 MG tablet Take 325 mg by mouth daily.   Yes [provider]  metoprolol tartrate (LOPRESSOR) 50 MG tablet Take 2 tablets (100 mg total) by mouth 2 (two) times daily. 01/19/18  Yes Auburn BilberryPatel, Shreyang, MD  nitroGLYCERIN (NITROSTAT) 0.4 MG SL tablet Place 1 tablet (0.4 mg total) under the tongue every 5 (five) minutes as needed for chest pain. 01/18/18  Yes Auburn BilberryPatel, Shreyang, MD  potassium chloride (K-DUR) 10 MEQ tablet Take 1 tablet (10 mEq total) by mouth 2 (two) times daily. 02/27/18  Yes Sudini, Wardell HeathSrikar, MD  rivaroxaban (XARELTO) 20 MG TABS tablet Take 1 tablet (20 mg total) by mouth daily. 01/18/18  Yes  Auburn Bilberry, MD  sacubitril-valsartan (ENTRESTO) 24-26 MG Take 1 tablet by mouth 2 (two) times daily. D/C lisinopril 02/02/18  Yes Clarisa Kindred A, FNP  torsemide (DEMADEX) 20 MG tablet Take 3 tablets (60 mg total) by mouth 2 (two) times daily. 02/27/18  Yes Milagros Loll, MD  traZODone (DESYREL) 150 MG tablet Take 1 tablet (150 mg total) by mouth at bedtime as needed for sleep. 01/18/18  Yes Auburn Bilberry, MD      PHYSICAL EXAMINATION:   VITAL SIGNS: Blood pressure (!) 148/78, pulse (!) 54, temperature 99.5 F (37.5 C), temperature source Oral, resp. rate (!)  26, height 5\' 8"  (1.727 m), weight (!) 175 kg, SpO2 98 %.  GENERAL:  58 y.o.-year-old morbidly obese patient lying in the bed with no acute distress.  EYES: Pupils equal, round, reactive to light and accommodation. No scleral icterus. Extraocular muscles intact.  HEENT: Head atraumatic, normocephalic. Oropharynx and nasopharynx clear.  NECK:  Supple, no jugular venous distention. No thyroid enlargement, no tenderness.  LUNGS: Normal breath sounds bilaterally, no wheezing, some crepitation. No use of accessory muscles of respiration.  CARDIOVASCULAR: S1, S2 normal. No murmurs, rubs, or gallops.  ABDOMEN: Soft, nontender, nondistended. Bowel sounds present. No organomegaly or mass.  EXTREMITIES: Bilateral pedal edema, no cyanosis, or clubbing.  NEUROLOGIC: Cranial nerves II through XII are intact. Muscle strength 5/5 in all extremities. Sensation intact. Gait not checked.  PSYCHIATRIC: The patient is alert and oriented x 3.  SKIN: Lateral aspect of left leg in lower one third there is 5 cm x 5 cm size superficial ulcer with redness and induration and surrounding skin which is warm to touch.  1 cm x 1 cm size superficial ulcer on shin of TBI in lower one third on right side with minimal redness surrounding.  LABORATORY PANEL:   CBC Recent Labs  Lab 03/16/18 2245  WBC 7.0  HGB 10.4*  HCT 34.3*  PLT 215  MCV 81.9  MCH 24.8*  MCHC 30.3  RDW 20.0*   ------------------------------------------------------------------------------------------------------------------  Chemistries  Recent Labs  Lab 03/16/18 2245  NA 136  K 3.5  CL 101  CO2 24  GLUCOSE 150*  BUN 8  CREATININE 0.86  CALCIUM 8.1*  AST 27  ALT 16  ALKPHOS 56  BILITOT 0.8   ------------------------------------------------------------------------------------------------------------------ estimated creatinine clearance is 148.8 mL/min (by C-G formula based on SCr of 0.86  mg/dL). ------------------------------------------------------------------------------------------------------------------ No results for input(s): TSH, T4TOTAL, T3FREE, THYROIDAB in the last 72 hours.  Invalid input(s): FREET3   Coagulation profile Recent Labs  Lab 03/16/18 2245  INR 1.05   ------------------------------------------------------------------------------------------------------------------- No results for input(s): DDIMER in the last 72 hours. -------------------------------------------------------------------------------------------------------------------  Cardiac Enzymes Recent Labs  Lab 03/16/18 2245  TROPONINI 0.04*   ------------------------------------------------------------------------------------------------------------------ Invalid input(s): POCBNP  ---------------------------------------------------------------------------------------------------------------  Urinalysis No results found for: COLORURINE, APPEARANCEUR, LABSPEC, PHURINE, GLUCOSEU, HGBUR, BILIRUBINUR, KETONESUR, PROTEINUR, UROBILINOGEN, NITRITE, LEUKOCYTESUR   RADIOLOGY: Dg Chest Portable 1 View  Result Date: 03/16/2018 CLINICAL DATA:  Chest pain, nausea, vomiting EXAM: PORTABLE CHEST 1 VIEW COMPARISON:  02/24/2018 FINDINGS: Cardiomegaly with vascular congestion. No overt edema. No confluent opacity or effusion. No acute bony abnormality. IMPRESSION: Cardiomegaly with vascular congestion. Electronically Signed   By: Charlett Nose M.D.   On: 03/16/2018 23:03    EKG: Orders placed or performed during the hospital encounter of 03/16/18  . ED EKG  . ED EKG  . EKG 12-Lead  . EKG 12-Lead    IMPRESSION AND PLAN:  *Sepsis Secondary to cellulitis Broad-spectrum antibiotics  given by ER. I will change to Unasyn. MRSA was negative in recent admissions. Blood cultures are sent.  *Acute on chronic systolic congestive heart failure 30% EF as per recent echocardiogram. IV Lasix, intake  and output monitoring, fluid restriction, daily weight Counseled about fluid restriction and restricted salt intake in diet. Continue home medications.  Already on Entresto.  *Atrial fibrillation Continue metoprolol and Xarelto.  *Hyperglycemia Check hemoglobin A1c.  *COPD Continue inhalers as home, no exacerbation at this time.  * Sleep apnea   CPAP as home regimen.  All the records are reviewed and case discussed with ED provider. Management plans discussed with the patient, family and they are in agreement.  CODE STATUS: Full.    Code Status Orders  (From admission, onward)         Start     Ordered   03/17/18 0043  Full code  Continuous     03/17/18 0042        Code Status History    Date Active Date Inactive Code Status Order ID Comments User Context   02/24/2018 0725 02/27/2018 1555 Full Code [id number]  Oralia Manis, MD Inpatient   01/27/2018 1345 01/29/2018 1406 Full Code [id number]  Alford Highland, MD ED   01/17/2018 1330 01/19/2018 1818 Partial Code [id number]  Milagros Loll, MD Inpatient   01/16/2018 1427 01/17/2018 1330 Full Code [id number]  Auburn Bilberry, MD ED       TOTAL TIME TAKING CARE OF THIS PATIENT: 50 minutes.    Altamese Dilling M.D on 03/17/2018   Between 7am to 6pm - Pager - [phone]  After 6pm go to [email or web address] - password [email or web address]  [facility]  Office  [phone]  CC: Primary care physician; Patient, No Pcp Per   Note: This dictation was prepared with Dragon dictation along with smaller phrase technology. Any transcriptional errors that result from this process are unintentional.

## 2018-03-17 NOTE — Progress Notes (Signed)
Patient did not show for his HF Clinic appointment yesterday, 03/16/2018. This has been rescheduled for Friday, February 7th @ 1:40pm.

## 2018-03-17 NOTE — Plan of Care (Signed)
  Problem: Health Behavior/Discharge Planning: Goal: Ability to manage health-related needs will improve Outcome: Progressing   

## 2018-03-17 NOTE — Progress Notes (Signed)
Talked to Dr. Imogene Burn about patient's complaints of headache, asked for pain medication, order for Tylenol given. Also notified about BP of 106/47, held lasix per hold parameter. RN will continue to monitor.

## 2018-03-17 NOTE — ED Notes (Signed)
ED TO INPATIENT HANDOFF REPORT  Name/Age/Gender [name] MajesticKenneth Werden 58 y.o. male  Code Status Code Status History    Date Active Date Inactive Code Status Order ID Comments User Context   02/24/2018 0725 02/27/2018 1555 Full Code [id number]  Oralia ManisWillis, David, MD Inpatient   01/27/2018 1345 01/29/2018 1406 Full Code [id number]  Alford HighlandWieting, Richard, MD ED   01/17/2018 1330 01/19/2018 1818 Partial Code [id number]  Milagros LollSudini, Srikar, MD Inpatient   01/16/2018 1427 01/17/2018 1330 Full Code [id number]  Auburn BilberryPatel, Shreyang, MD ED      Home/SNF/Other Home  Chief Complaint chest pain  Level of Care/Admitting Diagnosis ED Disposition    ED Disposition Condition Comment   Admit  Hospital Area: [facility] [[id number]]  Level of Care: Med-Surg [16]  Diagnosis: Sepsis [facility](HCC) [[id number]][[id number]]  Admitting Physician: Altamese DillingVACHHANI, VAIBHAVKUMAR [phone][[id number]]  Attending Physician: Altamese DillingVACHHANI, VAIBHAVKUMAR [phone][[id number]]  Estimated length of stay: past midnight tomorrow  Certification:: I certify this patient will need inpatient services for at least 2 midnights  PT Class (Do Not Modify): Inpatient [101]  PT Acc Code (Do Not Modify): Private [1]       Medical History Past Medical History:  Diagnosis Date  . A-fib (HCC)   . Asthma   . CHF (congestive heart failure) (HCC)   . COPD (chronic obstructive pulmonary disease) (HCC)   . Sleep apnea     Allergies No Known Allergies  IV Location/Drains/Wounds Patient Lines/Drains/Airways Status   Active Line/Drains/Airways    Name:   Placement date:   Placement time:   Site:   Days:   Peripheral IV 03/16/18 Right Hand   03/16/18    2225    Hand   1   Peripheral IV 03/16/18 Right   03/16/18    2316    -   1   Wound / Incision (Open or Dehisced) 01/17/18 Other (Comment) Tibial Left;Posterior scab that was open bleeding   01/17/18    1900    Tibial   59   Wound / Incision (Open or Dehisced) 02/24/18 Venous stasis ulcer Ankle Left;Lateral Pink shallow wound bed   02/24/18     1451    Ankle   21          Labs/Imaging Results for orders placed or performed during the hospital encounter of 03/16/18 (from the past 48 hour(s))  Basic metabolic panel     Status: Abnormal   Collection Time: 03/16/18 10:45 PM  Result Value Ref Range   Sodium 136 135 - 145 mmol/L   Potassium 3.5 3.5 - 5.1 mmol/L   Chloride 101 98 - 111 mmol/L   CO2 24 22 - 32 mmol/L   Glucose, Bld 150 (H) 70 - 99 mg/dL   BUN 8 6 - 20 mg/dL   Creatinine, Ser 6.440.86 0.61 - 1.24 mg/dL   Calcium 8.1 (L) 8.9 - 10.3 mg/dL   GFR calc non Af Amer >60 >60 mL/min   GFR calc Af Amer >60 >60 mL/min   Anion gap 11 5 - 15    Comment: Performed at [facility], [address] [place], [place] [address]  CBC     Status: Abnormal   Collection Time: 03/16/18 10:45 PM  Result Value Ref Range   WBC 7.0 4.0 - 10.5 K/uL   RBC 4.19 (L) 4.22 - 5.81 MIL/uL   Hemoglobin 10.4 (L) 13.0 - 17.0 g/dL   HCT 25.934.3 (L) 56.339.0 - 87.552.0 %   MCV 81.9 80.0 - 100.0 fL   MCH  24.8 (L) 26.0 - 34.0 pg   MCHC 30.3 30.0 - 36.0 g/dL   RDW 27.2 (H) 53.6 - 64.4 %   Platelets 215 150 - 400 K/uL   nRBC 0.0 0.0 - 0.2 %    Comment: Performed at [facility], [address] [place] [address] [place], [place] [address]  Troponin I - ONCE - STAT     Status: Abnormal   Collection Time: 03/16/18 10:45 PM  Result Value Ref Range   Troponin I 0.04 (HH) <0.03 ng/mL    Comment: CRITICAL RESULT CALLED TO, READ BACK BY AND VERIFIED WITH JENNA Deiondre Harrower 03/16/18 @ 2326  MLK Performed at [facility], [address] [place], [place] [address]   Brain natriuretic peptide     Status: Abnormal   Collection Time: 03/16/18 10:45 PM  Result Value Ref Range   B Natriuretic Peptide 257.0 (H) 0.0 - 100.0 pg/mL    Comment: Performed at [facility], [address] [place], [place] [address]  Lipase, blood     Status: None   Collection Time: 03/16/18 10:45 PM  Result Value Ref Range   Lipase 31 11 - 51 U/L    Comment: Performed at  [facility], [address] [place], [place] [address]  Procalcitonin     Status: None   Collection Time: 03/16/18 10:45 PM  Result Value Ref Range   Procalcitonin <0.10 ng/mL    Comment:        Interpretation: PCT (Procalcitonin) <= 0.5 ng/mL: Systemic infection (sepsis) is not likely. Local bacterial infection is possible. (NOTE)       Sepsis PCT Algorithm           Lower Respiratory Tract                                      Infection PCT Algorithm    ----------------------------     ----------------------------         PCT < 0.25 ng/mL                PCT < 0.10 ng/mL         Strongly encourage             Strongly discourage   discontinuation of antibiotics    initiation of antibiotics    ----------------------------     -----------------------------       PCT 0.25 - 0.50 ng/mL            PCT 0.10 - 0.25 ng/mL               OR       >80% decrease in PCT            Discourage initiation of                                            antibiotics      Encourage discontinuation           of antibiotics    ----------------------------     -----------------------------         PCT >= 0.50 ng/mL              PCT 0.26 - 0.50 ng/mL               AND        <  80% decrease in PCT             Encourage initiation of                                             antibiotics       Encourage continuation           of antibiotics    ----------------------------     -----------------------------        PCT >= 0.50 ng/mL                  PCT > 0.50 ng/mL               AND         increase in PCT                  Strongly encourage                                      initiation of antibiotics    Strongly encourage escalation           of antibiotics                                     -----------------------------                                           PCT <= 0.25 ng/mL                                                 OR                                        > 80% decrease in  PCT                                     Discontinue / Do not initiate                                             antibiotics Performed at [facility], [address] [place], [place] [address]   Protime-INR     Status: None   Collection Time: 03/16/18 10:45 PM  Result Value Ref Range   Prothrombin Time 13.6 11.4 - 15.2 seconds   INR 1.05     Comment: Performed at [facility], [address] [place], [place] [address]  Hepatic function panel     Status: Abnormal   Collection Time: 03/16/18 10:45 PM  Result Value Ref Range   Total Protein 6.5 6.5 - 8.1 g/dL   Albumin 3.3 (L) 3.5 - 5.0 g/dL   AST 27 15 -  41 U/L   ALT 16 0 - 44 U/L   Alkaline Phosphatase 56 38 - 126 U/L   Total Bilirubin 0.8 0.3 - 1.2 mg/dL   Bilirubin, Direct 0.1 0.0 - 0.2 mg/dL   Indirect Bilirubin 0.7 0.3 - 0.9 mg/dL    Comment: Performed at [facility], [address] [place], [place] [address]  Lactic acid, plasma     Status: Abnormal   Collection Time: 03/16/18 11:24 PM  Result Value Ref Range   Lactic Acid, Venous 3.0 (HH) 0.5 - 1.9 mmol/L    Comment: CRITICAL RESULT CALLED TO, READ BACK BY AND VERIFIED WITH JENNA Katalyna Socarras 03/16/18 @ 2356  MLK Performed at [facility], [address] [place], [place] [address]   Influenza panel by PCR (type A & B)     Status: None   Collection Time: 03/16/18 11:25 PM  Result Value Ref Range   Influenza A By PCR NEGATIVE NEGATIVE   Influenza B By PCR NEGATIVE NEGATIVE    Comment: (NOTE) The Xpert Xpress Flu assay is intended as an aid in the diagnosis of  influenza and should not be used as a sole basis for treatment.  This  assay is FDA approved for nasopharyngeal swab specimens only. Nasal  washings and aspirates are unacceptable for Xpert Xpress Flu testing. Performed at [facility], [address] [place], [place] [address]    Dg Chest Portable 1 View  Result Date: 03/16/2018 CLINICAL DATA:  Chest pain,  nausea, vomiting EXAM: PORTABLE CHEST 1 VIEW COMPARISON:  02/24/2018 FINDINGS: Cardiomegaly with vascular congestion. No overt edema. No confluent opacity or effusion. No acute bony abnormality. IMPRESSION: Cardiomegaly with vascular congestion. Electronically Signed   By: Charlett Nose M.D.   On: 03/16/2018 23:03    Pending Labs Unresulted Labs (From admission, onward)    Start     Ordered   03/16/18 2314  Wound or Superficial Culture  ONCE - STAT,   STAT    Question:  Patient immune status  Answer:  Normal   03/16/18 2313   03/16/18 2303  Lactic acid, plasma  STAT Now then every 3 hours,   STAT     03/16/18 2303   03/16/18 2303  Blood Culture (routine x 2)  BLOOD CULTURE X 2,   STAT     03/16/18 2303   03/16/18 2303  Urinalysis, Complete w Microscopic  ONCE - STAT,   STAT     03/16/18 2303   03/16/18 2303  Urine culture  Add-on,   AD     03/16/18 2303          Vitals/Pain Today's Vitals   03/16/18 2246 03/16/18 2315 03/16/18 2330 03/17/18 0000  BP: 129/77  136/72 130/68  Pulse: (!) 110 (!) 120 (!) 56 (!) 106  Resp: (!) 22 (!) 24 (!) 21 (!) 21  Temp: (!) 100.6 F (38.1 C)     TempSrc: Oral     SpO2: 95% 97% 97% 95%  Weight:      Height:      PainSc: 7        Isolation Precautions Droplet precaution  Medications Medications  cefTRIAXone (ROCEPHIN) 2 g in sodium chloride 0.9 % 100 mL IVPB (0 g Intravenous Stopped 03/17/18 0013)  vancomycin (VANCOCIN) 2,000 mg in sodium chloride 0.9 % 500 mL IVPB (2,000 mg Intravenous New Bag/Given 03/17/18 0020)  ondansetron (ZOFRAN) injection 4 mg (4 mg Intravenous Given 03/16/18 2335)    Mobility walks with device

## 2018-03-17 NOTE — Progress Notes (Signed)
CODE SEPSIS - PHARMACY COMMUNICATION  **Broad Spectrum Antibiotics should be administered within 1 hour of Sepsis diagnosis**  Time Code Sepsis Called/Page Received: 2306  Antibiotics Ordered: vanc/ceftriaxone  Time of 1st antibiotic administration: 2343  Additional action taken by pharmacy:   If necessary, Name of Provider/Nurse Contacted:     Thomasene Ripple ,PharmD Clinical Pharmacist  03/17/2018  2:01 AM

## 2018-03-17 NOTE — Progress Notes (Addendum)
Pt BP was at 0030 was at 148/78 from the ED. Pt got on the floor and BP was at 124/73 HR 92. Pt have a scheduled metropolol and entresto and pt states have not taken it yesterday. Page Prime and talked to Dr. Madelon Lips and ordered to just hold off on metropolol and Entresto at 0130 but to just give it at 1000 in the morning, but give Lasix 40 mg due to diminished lung sounds. Will continue to monitor.  Update 0225: Pt states he felt nauseous. Notify Prime. Dr Madelon Lips ordered Reglan 5 mg IV once. Will continue to monitor.  Update 0320: Pt lactic is down to 2.1 from 3.0. Dr. Katheren Shams was notified. Will continue to monitor.  Update 0358: Pt refused bed alarm but was educated about safety. Will continue to monitor.  Update 0558: Pt was given the Hand out for CHF and watch the EMMI video. Will continue to monitor.  Update 0600. Pt BP was at 105/53 HR 71. Dr. Katheren Shams was notified but no new order was place but to continue monitor  Pt. Will continue to monitor.

## 2018-03-17 NOTE — Consult Note (Signed)
WOC Nurse wound consult note Patient receiving care in [facility].  No family present.  However, off going and oncoming RNs both present. Reason for Consult: BLE wounds Wound type: BLE have changes consistent with venous insufficiency including pitting edema, hemosiderin deposits, and dry, flaking skin.   Measurements: Right lateral LE has a dry ulceration that is covered in what appears to be dried drainage.  It measures 1.3 cm x 1.3 cm.  The RLE does not have overt s/s of cellulitis.  The LLE however, does have overt s/s of cellulitis including bright red erythema, warmth, and tenderness.  The LLE lateral side has an area missing the epithelial layer that measures 7 cm x 5 cm.  This wound is moist and red.  This leg also has changes consistent with venous insufficiency that are identical to the RLE.  The patient is currently on a CPAP or BiPAP machine and is not suitable for compressive therapy at this time due to the cellulitis, respiratory challenges, and I do not see any blood flow studies for the legs or ABI.  For now, I am ordering a topical approach for protection of the areas while the medical team manages the cellulitis and any blood flow studies they deem appropriate.  Dressing procedure/placement/frequency:  Gently wash the BLE with warm, soapy water and pat dry.  Apply Sween 24 moisturizing ointment (in the pink and white tube in the clean utility) to the dry skin of the legs.  Cover all open wounds on the legs with Xeroform gauze [place] [phone]). Secure with kerlex. Change daily. Monitor the wound area(s) for worsening of condition such as: Signs/symptoms of infection,  Increase in size,  Development of or worsening of odor, Development of pain, or increased pain at the affected locations.  Notify the medical team if any of these develop.  Thank you for the consult.  Discussed plan of care with the patient and bedside nurse.  WOC nurse will not follow at this time.  Please re-consult the WOC  team if needed.  Helmut Muster, RN, MSN, CWOCN, CNS-BC, pager [phone]

## 2018-03-17 NOTE — Progress Notes (Signed)
Family Meeting Note  Advance Directive:no  Today a meeting took place with the Patient.  The following clinical team members were present during this meeting:MD  The following were discussed:Patient's diagnosis: Cellulitis, sepsis, acute on chronic systolic congestive heart failure, hypertension, Patient's progosis: Unable to determine and Goals for treatment: Full Code  Additional follow-up to be provided: PMD  Time spent during discussion:20 minutes  Altamese Dilling, MD

## 2018-03-17 NOTE — Progress Notes (Signed)
Pharmacy Antibiotic Note  [name] [name] is a 58 y.o. male admitted on 03/16/2018 with sepsis.  Pharmacy has been consulted for vanc/unasyn dosing.  Plan: Patient received vanc 2g IV load x 1 in the ED  Vancomycin 1250 mg IV Q 8 hrs. Goal AUC 400-550. Expected AUC: 533.7 SCr used: 0.86 mg/dL  Ke 4.917 (per adjBW); 0.080 (per IBW) T1/2 8 hrs  Css trough 17.9 mcg/mL  Will start Unasyn 3g IV q6h   Height: 5\' 8"  (172.7 cm) Weight: (!) 384 lb 14.4 oz (174.6 kg) IBW/kg (Calculated) : 68.4  Temp (24hrs), Avg:99.9 F (37.7 C), Min:99.5 F (37.5 C), Max:100.6 F (38.1 C)  Recent Labs  Lab 03/16/18 2245 03/16/18 2324  WBC 7.0  --   CREATININE 0.86  --   LATICACIDVEN  --  3.0*    Estimated Creatinine Clearance: 148.7 mL/min (by C-G formula based on SCr of 0.86 mg/dL).    No Known Allergies  Thank you for allowing pharmacy to be a part of this patient's care.  Thomasene Ripple, PharmD, BCPS Clinical Pharmacist 03/17/2018

## 2018-03-17 NOTE — Progress Notes (Signed)
[facility] - [place] at [facility]   PATIENT NAME: [name] [name]    MR#:  [id number]  DATE OF BIRTH:  [birth year]  SUBJECTIVE:  CHIEF COMPLAINT:   Chief Complaint  Patient presents with  . Chest Pain   Left leg pain, dizziness, weakness and shortness of breath. REVIEW OF SYSTEMS:  Review of Systems  Constitutional: Positive for malaise/fatigue. Negative for chills and fever.  HENT: Negative for sore throat.   Eyes: Negative for blurred vision and double vision.  Respiratory: Positive for shortness of breath. Negative for cough, hemoptysis, wheezing and stridor.   Cardiovascular: Positive for leg swelling. Negative for chest pain, palpitations and orthopnea.  Gastrointestinal: Negative for abdominal pain, blood in stool, diarrhea, melena, nausea and vomiting.  Genitourinary: Negative for dysuria, flank pain and hematuria.  Musculoskeletal: Negative for back pain and joint pain.       Left leg pain, swelling and ulcer.  Skin: Negative for rash.  Neurological: Positive for dizziness. Negative for sensory change, focal weakness, seizures, loss of consciousness, weakness and headaches.  Endo/Heme/Allergies: Negative for polydipsia.  Psychiatric/Behavioral: Negative for depression. The patient is not nervous/anxious.     DRUG ALLERGIES:  No Known Allergies VITALS:  Blood pressure 103/66, pulse 82, temperature 99.2 F (37.3 C), temperature source Axillary, resp. rate 19, height 5\' 8"  (1.727 m), weight (!) 173.8 kg, SpO2 96 %. PHYSICAL EXAMINATION:  Physical Exam Constitutional:      General: He is not in acute distress.    Comments: Morbid obesity  HENT:     Head: Normocephalic.     Mouth/Throat:     Mouth: Mucous membranes are moist.  Eyes:     General: No scleral icterus.    Conjunctiva/sclera: Conjunctivae normal.     Pupils: Pupils are equal, round, and reactive to light.  Neck:     Musculoskeletal: Normal range of motion and neck supple.   Vascular: No JVD.     Trachea: No tracheal deviation.  Cardiovascular:     Rate and Rhythm: Normal rate and regular rhythm.     Heart sounds: Normal heart sounds. No murmur. No gallop.   Pulmonary:     Effort: Pulmonary effort is normal. No respiratory distress.     Breath sounds: Normal breath sounds. No wheezing or rales.  Abdominal:     General: Bowel sounds are normal. There is no distension.     Palpations: Abdomen is soft.     Tenderness: There is no abdominal tenderness. There is no rebound.  Musculoskeletal: Normal range of motion.        General: No tenderness.     Right lower leg: Edema present.     Left lower leg: Edema present.     Comments: Left leg edema, ulcer and erythema.  Chronic skin changes.  Skin:    Findings: No erythema or rash.  Neurological:     General: No focal deficit present.     Mental Status: He is alert and oriented to person, place, and time.     Cranial Nerves: No cranial nerve deficit.  Psychiatric:        Mood and Affect: Mood normal.    LABORATORY PANEL:  Male CBC Recent Labs  Lab 03/16/18 2245  WBC 7.0  HGB 10.4*  HCT 34.3*  PLT 215   ------------------------------------------------------------------------------------------------------------------ Chemistries  Recent Labs  Lab 03/16/18 2245  NA 136  K 3.5  CL 101  CO2 24  GLUCOSE 150*  BUN 8  CREATININE 0.86  CALCIUM 8.1*  AST 27  ALT 16  ALKPHOS 56  BILITOT 0.8   RADIOLOGY:  Dg Chest Portable 1 View  Result Date: 03/16/2018 CLINICAL DATA:  Chest pain, nausea, vomiting EXAM: PORTABLE CHEST 1 VIEW COMPARISON:  02/24/2018 FINDINGS: Cardiomegaly with vascular congestion. No overt edema. No confluent opacity or effusion. No acute bony abnormality. IMPRESSION: Cardiomegaly with vascular congestion. Electronically Signed   By: Charlett Nose M.D.   On: 03/16/2018 23:03   ASSESSMENT AND PLAN:  Oc [name] [name]  is a 58 y.o. male with a known history of A. fib, asthma, systolic  CHF, COPD, sleep apnea-was recently admitted with CHF.  At home he had worsening swelling on his legs for last few days and he had a big blister on his left leg which busted few days ago and since then there is some redness surrounding there and it is hurting him.  He also complains of getting short of breath with minimal exertion.  *Sepsis Secondary to cellulitis Broad-spectrum antibiotics given by ER. abx is changed to Unasyn. MRSA was negative in recent admissions. Blood cultures are negative so far  Lactic acidosis due to above.  Improved with treatment.  *Acute on chronic systolic congestive heart failure 30% EF as per recent echocardiogram. IV Lasix, intake and output monitoring, fluid restriction, daily weight Counseled about fluid restriction and restricted salt intake in diet. Continue home medications.  Already on Entresto.  *Atrial fibrillation Continue metoprolol and Xarelto.  Decrease Lopressor to 50 mg twice daily due to soft blood pressure.  *Hyperglycemia due to DM2. Hemoglobin A1c: 6.9.  Start sliding scale.  *COPD Continue inhalers as home, no exacerbation at this time.  * Sleep apnea   CPAP as home regimen.  Morbid obesity.  Diet control and follow-up with PCP.  All the records are reviewed and case discussed with Care Management/Social Worker. Management plans discussed with the patient, family and they are in agreement.  CODE STATUS: Full Code  TOTAL TIME TAKING CARE OF THIS PATIENT: 35 minutes.   More than 50% of the time was spent in counseling/coordination of care: YES  POSSIBLE D/C IN 2 DAYS, DEPENDING ON CLINICAL CONDITION.   Shaune Pollack M.D on 03/17/2018 at 2:42 PM  Between 7am to 6pm - Pager - [phone]  After 6pm go to [email or web address] - Therapist, nutritional Hospitalists

## 2018-03-18 ENCOUNTER — Inpatient Hospital Stay: Payer: Medicaid Other

## 2018-03-18 DIAGNOSIS — I5023 Acute on chronic systolic (congestive) heart failure: Secondary | ICD-10-CM

## 2018-03-18 LAB — BASIC METABOLIC PANEL
ANION GAP: 9 (ref 5–15)
BUN: 9 mg/dL (ref 6–20)
CALCIUM: 8.9 mg/dL (ref 8.9–10.3)
CO2: 29 mmol/L (ref 22–32)
Chloride: 98 mmol/L (ref 98–111)
Creatinine, Ser: 0.92 mg/dL (ref 0.61–1.24)
GFR calc Af Amer: >60 mL/min (ref >60)
GFR calc non Af Amer: >60 mL/min (ref >60)
Glucose, Bld: 146 mg/dL — ABNORMAL HIGH (ref 70–99)
POTASSIUM: 3.9 mmol/L (ref 3.5–5.1)
Sodium: 136 mmol/L (ref 135–145)

## 2018-03-18 LAB — GLUCOSE, CAPILLARY
GLUCOSE-CAPILLARY: 117 mg/dL — AB (ref 70–99)
Glucose-Capillary: 89 mg/dL (ref 70–99)
Glucose-Capillary: 126 mg/dL — ABNORMAL HIGH (ref 70–99)
Glucose-Capillary: 123 mg/dL — ABNORMAL HIGH (ref 70–99)

## 2018-03-18 LAB — URINE CULTURE: Culture: <10000 — AB

## 2018-03-18 LAB — MAGNESIUM: Magnesium: 1.5 mg/dL — ABNORMAL LOW (ref 1.7–2.4)

## 2018-03-18 MED ORDER — FUROSEMIDE 10 MG/ML IJ SOLN
80.0000 mg | Freq: Two times a day (BID) | INTRAMUSCULAR | Status: DC
  Administered 2018-03-18: 80 mg via INTRAVENOUS
  Filled 2018-03-18 (×2): qty 8

## 2018-03-18 MED ORDER — ORAL CARE MOUTH RINSE
15.0000 mL | Freq: Two times a day (BID) | OROMUCOSAL | Status: DC
  Administered 2018-03-21 – 2018-03-24 (×5): 15 mL via OROMUCOSAL

## 2018-03-18 MED ORDER — MAGNESIUM SULFATE 2 GM/50ML IV SOLN
2.0000 g | Freq: Once | INTRAVENOUS | Status: AC
  Administered 2018-03-18: 2 g via INTRAVENOUS
  Filled 2018-03-18: qty 50

## 2018-03-18 MED ORDER — SODIUM CHLORIDE 0.9 % IV SOLN
INTRAVENOUS | Status: DC | PRN
  Administered 2018-03-18: 500 mL via INTRAVENOUS
  Administered 2018-03-19: 50 mL via INTRAVENOUS
  Administered 2018-03-21: 500 mL via INTRAVENOUS

## 2018-03-18 NOTE — Progress Notes (Signed)
[facility] - [place] at [facility]   PATIENT NAME: [name] [name]    MR#:  [id number]  DATE OF BIRTH:  1960/04/10  SUBJECTIVE:  CHIEF COMPLAINT:   Chief Complaint  Patient presents with  . Chest Pain   The patient still complains of shortness of breath and leg swelling, on oxygen 3 L.  He had A. fib with RVR at about 140-150 this morning.  He has chronic headache. REVIEW OF SYSTEMS:  Review of Systems  Constitutional: Positive for malaise/fatigue. Negative for chills and fever.  HENT: Negative for sore throat.   Eyes: Negative for blurred vision and double vision.  Respiratory: Positive for shortness of breath. Negative for cough, hemoptysis, wheezing and stridor.   Cardiovascular: Positive for leg swelling. Negative for chest pain, palpitations and orthopnea.  Gastrointestinal: Negative for abdominal pain, blood in stool, diarrhea, melena, nausea and vomiting.  Genitourinary: Negative for dysuria, flank pain and hematuria.  Musculoskeletal: Negative for back pain and joint pain.       Left leg pain, swelling and ulcer.  Skin: Negative for rash.  Neurological: Positive for headaches. Negative for dizziness, sensory change, focal weakness, seizures, loss of consciousness and weakness.  Endo/Heme/Allergies: Negative for polydipsia.  Psychiatric/Behavioral: Negative for depression. The patient is not nervous/anxious.     DRUG ALLERGIES:  No Known Allergies VITALS:  Blood pressure (!) 123/95, pulse 70, temperature 98.7 F (37.1 C), temperature source Oral, resp. rate 16, height 5\' 8"  (1.727 m), weight (!) 174.9 kg, SpO2 95 %. PHYSICAL EXAMINATION:  Physical Exam Constitutional:      General: He is not in acute distress.    Comments: Morbid obesity  HENT:     Head: Normocephalic.     Mouth/Throat:     Mouth: Mucous membranes are moist.  Eyes:     General: No scleral icterus.    Conjunctiva/sclera: Conjunctivae normal.     Pupils: Pupils are equal,  round, and reactive to light.  Neck:     Musculoskeletal: Normal range of motion and neck supple.     Vascular: No JVD.     Trachea: No tracheal deviation.  Cardiovascular:     Rate and Rhythm: Normal rate and regular rhythm.     Heart sounds: Normal heart sounds. No murmur. No gallop.   Pulmonary:     Effort: Pulmonary effort is normal. No respiratory distress.     Breath sounds: Normal breath sounds. No wheezing or rales.  Abdominal:     General: Bowel sounds are normal. There is no distension.     Palpations: Abdomen is soft.     Tenderness: There is no abdominal tenderness. There is no rebound.  Musculoskeletal: Normal range of motion.        General: No tenderness.     Right lower leg: Edema present.     Left lower leg: Edema present.     Comments: Left leg edema, ulcer and erythema.  Chronic skin changes.  Skin:    Findings: No erythema or rash.  Neurological:     General: No focal deficit present.     Mental Status: He is alert and oriented to person, place, and time.     Cranial Nerves: No cranial nerve deficit.  Psychiatric:        Mood and Affect: Mood normal.    LABORATORY PANEL:  Male CBC Recent Labs  Lab 03/16/18 2245  WBC 7.0  HGB 10.4*  HCT 34.3*  PLT 215   ------------------------------------------------------------------------------------------------------------------ Chemistries  Recent Labs  Lab 03/16/18 2245 03/18/18 0338  NA 136 136  K 3.5 3.9  CL 101 98  CO2 24 29  GLUCOSE 150* 146*  BUN 8 9  CREATININE 0.86 0.92  CALCIUM 8.1* 8.9  MG  --  1.5*  AST 27  --   ALT 16  --   ALKPHOS 56  --   BILITOT 0.8  --    RADIOLOGY:  Dg Chest 2 View  Result Date: 03/18/2018 CLINICAL DATA:  CHF. EXAM: CHEST - 2 VIEW COMPARISON:  Chest x-ray dated March 16, 2018. FINDINGS: Stable mild cardiomegaly and pulmonary vascular congestion. No focal consolidation, pleural effusion, or pneumothorax. No acute osseous abnormality. IMPRESSION: 1. Stable  pulmonary vascular congestion without overt edema. Electronically Signed   By: Obie Dredge M.D.   On: 03/18/2018 08:38   ASSESSMENT AND PLAN:  [name] [name]  is a 58 y.o. male with a known history of A. fib, asthma, systolic CHF, COPD, sleep apnea-was recently admitted with CHF.  At home he had worsening swelling on his legs for last few days and he had a big blister on his left leg which busted few days ago and since then there is some redness surrounding there and it is hurting him.  He also complains of getting short of breath with minimal exertion.  *Sepsis Secondary to cellulitis Broad-spectrum antibiotics given by ER. abx is changed to Unasyn. MRSA was negative in recent admissions. Blood cultures are negative so far  Lactic acidosis due to above.  Improved with treatment.  *Acute respiratory failure with hypoxia, possible due to acute on chronic systolic congestive heart failure and OSA 30% EF as per recent echocardiogram. Continue IV Lasix if BP allows, intake and output monitoring, fluid restriction, daily weight Counseled about fluid restriction and restricted salt intake in diet. Continue home medications.  Already on Entresto. Cardiology consult.  *Atrial fibrillation with RVR Continue metoprolol and Xarelto.  Decreased Lopressor to 50 mg twice daily due to soft blood pressure.  Follow-up cardiologist.  *Hyperglycemia due to DM2. Hemoglobin A1c: 6.9.   Continue sliding scale.  *COPD Continue inhalers as home, no exacerbation at this time.  * Sleep apnea   CPAP as home regimen.  Morbid obesity.  Diet control and follow-up PCP.  Hypomagnesemia.  IV magnesium.  The patient said he has no PCP and cardiologist.  He recently moved to this area from [place].  He will need set up PCP and cardiology appointment on discharge. All the records are reviewed and case discussed with Care Management/Social Worker. Management plans discussed with the patient, family and  they are in agreement.  CODE STATUS: Full Code  TOTAL TIME TAKING CARE OF THIS PATIENT: 35 minutes.   More than 50% of the time was spent in counseling/coordination of care: YES  POSSIBLE D/C IN 2 DAYS, DEPENDING ON CLINICAL CONDITION.   Shaune Pollack M.D on 03/18/2018 at 12:07 PM  Between 7am to 6pm - Pager - [phone]  After 6pm go to [email or web address] - Therapist, nutritional Hospitalists

## 2018-03-18 NOTE — Progress Notes (Signed)
Held both metoprolol and valsartan due to low BP, order parameters not met. BP is 114/54, MAP 70.

## 2018-03-18 NOTE — Consult Note (Signed)
CARDIOLOGY CONSULT NOTE  Patient ID: [name] [name] MRN: [id number] DOB/AGE: 1960/06/08 58 y.o.  Admit date: 03/16/2018 Primary Physician Patient, No Pcp Per Primary Cardiologist  Chief Complaint  Edema Requesting    Dr. Imogene Burn  HPI:   The patient has a history of chronic systolic HF.  He presented to the ED predominantly with SOB.  He reports that he was unable to sleep the night before the admission because of SOB.  He says that he is avoiding salt and he doesn't eat out much now.   However, he had acute on chronic dyspnea.  He has a constant chest pain.  This is under his left breast.  This has not changed since the last time he had a cath which he reports is about a year ago.  He reports that he had no coronary disease at that time.  On this admission he was noted to have sinustachycardia.  He has a leg ulcer and is not thought to have sepsis and cellulitis.  He has acute on chronic systolic HF signs and symptoms.  We are asked by Dr. Imogene Burn to assist with acute on chronic systolic HF.     The patient was admitted to the hospital earlier this month with acute respiratory failure.  He was treated for a CHF and COPD exacerbation.   He recently moved from [place], did not have established health care in the area and was not compliant with diet which precipitated has admission.   He was to follow up in our HF clinic at discharge on Thursday but presented to the ED instead.   Of note his weight on the day of discharge on the 12th wass 178.3 kg.   His weight is actually lower at 175 kg.     He reports cough and fevers.  He is very limited in his activities.    Past Medical History:  Diagnosis Date  . A-fib (HCC)   . Asthma   . CHF (congestive heart failure) (HCC)   . COPD (chronic obstructive pulmonary disease) (HCC)   . Sleep apnea     Past Surgical History:  Procedure Laterality Date  . hearnia repair    . LEG SURGERY      No Known Allergies Medications Prior to Admission    Medication Sig Dispense Refill Last Dose  . albuterol (PROVENTIL HFA;VENTOLIN HFA) 108 (90 Base) MCG/ACT inhaler Inhale 2 puffs into the lungs every 6 (six) hours as needed for wheezing or shortness of breath. 1 Inhaler 0 prn at prn  . aspirin 325 MG tablet Take 325 mg by mouth daily.   03/16/2018 at Unknown time  . metoprolol tartrate (LOPRESSOR) 50 MG tablet Take 2 tablets (100 mg total) by mouth 2 (two) times daily. 60 tablet 2 03/15/2018 at Unknown time  . nitroGLYCERIN (NITROSTAT) 0.4 MG SL tablet Place 1 tablet (0.4 mg total) under the tongue every 5 (five) minutes as needed for chest pain. 15 tablet 12 prn at prn  . potassium chloride (K-DUR) 10 MEQ tablet Take 1 tablet (10 mEq total) by mouth 2 (two) times daily. 60 tablet 0 03/15/2018 at Unknown time  . rivaroxaban (XARELTO) 20 MG TABS tablet Take 1 tablet (20 mg total) by mouth daily. 30 tablet 2 03/15/2018 at Unknown time  . sacubitril-valsartan (ENTRESTO) 24-26 MG Take 1 tablet by mouth 2 (two) times daily. D/C lisinopril 60 tablet 3 03/15/2018 at Unknown time  . torsemide (DEMADEX) 20 MG tablet Take 3 tablets (60 mg total) by  mouth 2 (two) times daily. 180 tablet 0 03/15/2018 at Unknown time  . traZODone (DESYREL) 150 MG tablet Take 1 tablet (150 mg total) by mouth at bedtime as needed for sleep. 30 tablet 2 prn at prn   Family History  Problem Relation Age of Onset  . Heart failure Mother   . Lung cancer Mother   . Lung cancer Father     Social History   Socioeconomic History  . Marital status: Single    Spouse name: Not on file  . Number of children: Not on file  . Years of education: Not on file  . Highest education level: Not on file  Occupational History  . Not on file  Social Needs  . Financial resource strain: Not on file  . Food insecurity:    Worry: Not on file    Inability: Not on file  . Transportation needs:    Medical: Not on file    Non-medical: Not on file  Tobacco Use  . Smoking status: Never Smoker  .  Smokeless tobacco: Never Used  Substance and Sexual Activity  . Alcohol use: Never    Frequency: Never  . Drug use: Never  . Sexual activity: Not on file  Lifestyle  . Physical activity:    Days per week: Not on file    Minutes per session: Not on file  . Stress: Not on file  Relationships  . Social connections:    Talks on phone: Not on file    Gets together: Not on file    Attends religious service: Not on file    Active member of club or organization: Not on file    Attends meetings of clubs or organizations: Not on file    Relationship status: Not on file  . Intimate partner violence:    Fear of current or ex partner: Not on file    Emotionally abused: Not on file    Physically abused: Not on file    Forced sexual activity: Not on file  Other Topics Concern  . Not on file  Social History Narrative  . Not on file     ROS:    As stated in the HPI and negative for all other systems.  Physical Exam: Blood pressure (!) 123/95, pulse 70, temperature 98.7 F (37.1 C), temperature source Oral, resp. rate 16, height 5\' 8"  (1.727 m), weight (!) 174.9 kg, SpO2 95 %.  GENERAL:  Well appearing HEENT:  Pupils equal round and reactive, fundi not visualized, oral mucosa unremarkable NECK:  No jugular venous distention, waveform within normal limits, carotid upstroke brisk and symmetric, no bruits, no thyromegaly LYMPHATICS:  No cervical, inguinal adenopathy LUNGS:  Clear to auscultation bilaterally BACK:  No CVA tenderness CHEST:  Unremarkable HEART:  PMI not displaced or sustained,S1 and S2 within normal limits, no S3, no clicks, no rubs, no murmurs (distant heart sounds),  irregular ABD:  Flat, positive bowel sounds normal in frequency in pitch, no bruits, no rebound, no guarding, no midline pulsatile mass, no hepatomegaly, no splenomegaly, morbid obesity EXT:  2 plus pulses throughout, severe edema, no cyanosis no clubbing SKIN:   Chronic stasis changes.  Ulcer anterior tibial  with surrounding erythema.   NEURO:  Cranial nerves II through XII grossly intact, motor grossly intact throughout PSYCH:  Cognitively intact, oriented to person place and time    Labs: Lab Results  Component Value Date   BUN 9 03/18/2018   Lab Results  Component Value Date  CREATININE 0.92 03/18/2018   Lab Results  Component Value Date   NA 136 03/18/2018   K 3.9 03/18/2018   CL 98 03/18/2018   CO2 29 03/18/2018   Lab Results  Component Value Date   TROPONINI 0.04 (HH) 03/16/2018   Lab Results  Component Value Date   WBC 7.0 03/16/2018   HGB 10.4 (L) 03/16/2018   HCT 34.3 (L) 03/16/2018   MCV 81.9 03/16/2018   PLT 215 03/16/2018   Lab Results  Component Value Date   CHOL 185 01/28/2018   HDL 81 01/28/2018   LDLCALC 94 01/28/2018   TRIG 48 01/28/2018   CHOLHDL 2.3 01/28/2018   Lab Results  Component Value Date   ALT 16 03/16/2018   AST 27 03/16/2018   ALKPHOS 56 03/16/2018   BILITOT 0.8 03/16/2018      Radiology:   CXR:    Stable pulmonary vascular congestion without overt edema.  EKG:   Atrial fib, rate 119, LBBB.   ASSESSMENT AND PLAN:   ACUTE ON CHRONIC SYSTOLIC HF:  Difficult to assess his volume secondary to his morbid obesity.   He has an EF of 30% on echo in Dec.  He has some evidence of RV enlargement and I am sure there is a component of hypoventilation obesity syndrome.  Diuresis is problematic in this situation because there is usually some elevated pulmonary pressures and CO is preload dependent.  However, I think we need to attempt more aggressive diuresis with increased Lasix.  He knows that he needs to keep his feet elevated.  BP is somewhat labile so no med titration at this point.   He reports salt and fluid restriction.  We talked about the importance of keeping his feet elevated.    ATRIAL FIB:  On Xarelto.  Stop ASA.  Continue current meds.       MORBID OBESITY:  Certainly a life limiting problem.  Continue education.      SignedRollene Rotunda: Takari Lundahl 03/18/2018, 11:57 AM

## 2018-03-19 ENCOUNTER — Other Ambulatory Visit: Payer: Self-pay

## 2018-03-19 LAB — BASIC METABOLIC PANEL
Anion gap: 7 (ref 5–15)
BUN: 12 mg/dL (ref 6–20)
CO2: 27 mmol/L (ref 22–32)
Calcium: 8.7 mg/dL — ABNORMAL LOW (ref 8.9–10.3)
Chloride: 100 mmol/L (ref 98–111)
Creatinine, Ser: 0.88 mg/dL (ref 0.61–1.24)
GFR calc Af Amer: >60 mL/min (ref >60)
GFR calc non Af Amer: >60 mL/min (ref >60)
Glucose, Bld: 175 mg/dL — ABNORMAL HIGH (ref 70–99)
Potassium: 3.8 mmol/L (ref 3.5–5.1)
Sodium: 134 mmol/L — ABNORMAL LOW (ref 135–145)

## 2018-03-19 LAB — CBC
HEMATOCRIT: 33.6 % — AB (ref 39.0–52.0)
Hemoglobin: 10.1 g/dL — ABNORMAL LOW (ref 13.0–17.0)
MCH: 25.1 pg — ABNORMAL LOW (ref 26.0–34.0)
MCHC: 30.1 g/dL (ref 30.0–36.0)
MCV: 83.6 fL (ref 80.0–100.0)
Platelets: 166 10*3/uL (ref 150–400)
RBC: 4.02 MIL/uL — ABNORMAL LOW (ref 4.22–5.81)
RDW: 19.9 % — ABNORMAL HIGH (ref 11.5–15.5)
WBC: 4.2 10*3/uL (ref 4.0–10.5)
nRBC: 0 % (ref 0.0–0.2)

## 2018-03-19 LAB — AEROBIC CULTURE W GRAM STAIN (SUPERFICIAL SPECIMEN): Gram Stain: NONE SEEN

## 2018-03-19 LAB — AEROBIC CULTURE  (SUPERFICIAL SPECIMEN): SPECIAL REQUESTS: NORMAL

## 2018-03-19 LAB — MAGNESIUM: Magnesium: 1.6 mg/dL — ABNORMAL LOW (ref 1.7–2.4)

## 2018-03-19 MED ORDER — FUROSEMIDE 10 MG/ML IJ SOLN
100.0000 mg | Freq: Two times a day (BID) | INTRAVENOUS | Status: DC
  Administered 2018-03-19: 100 mg via INTRAVENOUS
  Filled 2018-03-19 (×3): qty 10

## 2018-03-19 MED ORDER — MAGNESIUM SULFATE 2 GM/50ML IV SOLN
2.0000 g | Freq: Once | INTRAVENOUS | Status: AC
  Administered 2018-03-19: 2 g via INTRAVENOUS
  Filled 2018-03-19: qty 50

## 2018-03-19 NOTE — Progress Notes (Signed)
Progress Note  Patient Name: [name] [name] Date of Encounter: 03/19/2018  Primary Cardiologist:   No primary care provider on file.   Subjective   Breathing better but not at baseline.   Inpatient Medications    Scheduled Meds: . furosemide  80 mg Intravenous Q12H  . mouth rinse  15 mL Mouth Rinse BID  . metoprolol tartrate  50 mg Oral BID  . potassium chloride  20 mEq Oral BID  . rivaroxaban  20 mg Oral Q supper  . sacubitril-valsartan  1 tablet Oral BID   Continuous Infusions: . sodium chloride 50 mL (03/19/18 0054)  . ampicillin-sulbactam (UNASYN) IV 3 g (03/19/18 0950)   PRN Meds: sodium chloride, acetaminophen, albuterol, bisacodyl, nitroGLYCERIN, ondansetron (ZOFRAN) IV, senna, traZODone   Vital Signs    Vitals:   03/18/18 2202 03/19/18 0318 03/19/18 0328 03/19/18 0802  BP: (!) 114/54  (!) 149/88 101/77  Pulse: 92 (!) 110 66 (!) 102  Resp:   20   Temp:   98.8 F (37.1 C) 97.9 F (36.6 C)  TempSrc:    Oral  SpO2:   96% 97%  Weight:   (!) 173.7 kg   Height:        Intake/Output Summary (Last 24 hours) at 03/19/2018 1351 Last data filed at 03/19/2018 1345 Gross per 24 hour  Intake 1080 ml  Output 1325 ml  Net -245 ml   Filed Weights   03/17/18 0548 03/18/18 0511 03/19/18 0328  Weight: (!) 173.8 kg (!) 174.9 kg (!) 173.7 kg    Telemetry    Atrial fib with controlled rate - Personally Reviewed  ECG    NA - Personally Reviewed  Physical Exam   GEN: No acute distress.   Neck: No  JVD Cardiac: Irregular RR, no murmurs, rubs, or gallops.  Respiratory:   Decreased breath sounds GI: Soft, nontender, non-distended  MS:    Moderate leg edema; No deformity. Neuro:  Nonfocal  Psych: Normal affect   Labs    Chemistry Recent Labs  Lab 03/16/18 2245 03/18/18 0338 03/19/18 0449  NA 136 136 134*  K 3.5 3.9 3.8  CL 101 98 100  CO2 24 29 27   GLUCOSE 150* 146* 175*  BUN 8 9 12   CREATININE 0.86 0.92 0.88  CALCIUM 8.1* 8.9 8.7*  PROT 6.5  --    --   ALBUMIN 3.3*  --   --   AST 27  --   --   ALT 16  --   --   ALKPHOS 56  --   --   BILITOT 0.8  --   --   GFRNONAA >60 >60 >60  GFRAA >60 >60 >60  ANIONGAP 11 9 7      Hematology Recent Labs  Lab 03/16/18 2245 03/19/18 0449  WBC 7.0 4.2  RBC 4.19* 4.02*  HGB 10.4* 10.1*  HCT 34.3* 33.6*  MCV 81.9 83.6  MCH 24.8* 25.1*  MCHC 30.3 30.1  RDW 20.0* 19.9*  PLT 215 166    Cardiac Enzymes Recent Labs  Lab 03/16/18 2245  TROPONINI 0.04*   No results for input(s): TROPIPOC in the last 168 hours.   BNP Recent Labs  Lab 03/16/18 2245  BNP 257.0*     DDimer No results for input(s): DDIMER in the last 168 hours.   Radiology    Dg Chest 2 View  Result Date: 03/18/2018 CLINICAL DATA:  CHF. EXAM: CHEST - 2 VIEW COMPARISON:  Chest x-ray dated March 16, 2018. FINDINGS:  Stable mild cardiomegaly and pulmonary vascular congestion. No focal consolidation, pleural effusion, or pneumothorax. No acute osseous abnormality. IMPRESSION: 1. Stable pulmonary vascular congestion without overt edema. Electronically Signed   By: Obie Dredge M.D.   On: 03/18/2018 08:38    Cardiac Studies   Echo 01/17/18  Study Conclusions  - Left ventricle: The cavity size was moderately dilated. Systolic   function was moderately to severely reduced. The estimated   ejection fraction was 30%. Diffuse hypokinesis. - Right ventricle: The cavity size was moderately dilated.  Impressions:  - 4 chamber dilation with severe LV systolic dysfunction with   assesement of LV function by contrast. Sub -optimal study.   Addvise cardiology consult.  Patient Profile     58 y.o. male with chronic systolic HF who presented with acute on chronic SOB.  Some evidence of volume overload.  Also, with morbid obesity.  He also has chronic atrial fib.    Assessment & Plan    ACUTE ON CHRONIC SYSTOLIC HF:  Net negative Lasix 1800 cc.  Lasix increased yesterday.  I will increase this again today.  BP to low  to titrate meds  CHRONIC ATRIAL FIB:  Rate controlled.  Continue current therapy.    MORBID OBESITY:   Continue to educate.     For questions or updates, please contact [facility] Please consult [email or web address] for contact info under Cardiology/STEMI.   Signed, Rollene Rotunda, MD  03/19/2018, 1:51 PM

## 2018-03-19 NOTE — Progress Notes (Signed)
[employer] - [place] [facility]   PATIENT NAME: [name] [name]    MR#:  [id number]  DATE OF BIRTH:  [birth year]  SUBJECTIVE:  CHIEF COMPLAINT:   Chief Complaint  Patient presents with  . Chest Pain   The patient has better shortness of breath but still leg swelling, on oxygen 3  REVIEW OF SYSTEMS:  Review of Systems  Constitutional: Positive for malaise/fatigue. Negative for chills and fever.  HENT: Negative for sore throat.   Eyes: Negative for blurred vision and double vision.  Respiratory: Negative for cough, hemoptysis, shortness of breath, wheezing and stridor.   Cardiovascular: Positive for leg swelling. Negative for chest pain, palpitations and orthopnea.  Gastrointestinal: Negative for abdominal pain, blood in stool, diarrhea, melena, nausea and vomiting.  Genitourinary: Negative for dysuria, flank pain and hematuria.  Musculoskeletal: Negative for back pain and joint pain.       Left leg pain, swelling and ulcer.  Skin: Negative for rash.  Neurological: Negative for dizziness, sensory change, focal weakness, seizures, loss of consciousness, weakness and headaches.  Endo/Heme/Allergies: Negative for polydipsia.  Psychiatric/Behavioral: Negative for depression. The patient is not nervous/anxious.     DRUG ALLERGIES:  No Known Allergies VITALS:  Blood pressure 101/77, pulse (!) 102, temperature 97.9 F (36.6 C), temperature source Oral, resp. rate 20, height 5\' 8"  (1.727 m), weight (!) 173.7 kg, SpO2 97 %. PHYSICAL EXAMINATION:  Physical Exam Constitutional:      General: He is not in acute distress.    Comments: Morbid obesity  HENT:     Head: Normocephalic.     Mouth/Throat:     Mouth: Mucous membranes are moist.  Eyes:     General: No scleral icterus.    Conjunctiva/sclera: Conjunctivae normal.     Pupils: Pupils are equal, round, and reactive to light.  Neck:     Musculoskeletal: Normal range of motion and neck supple.   Vascular: No JVD.     Trachea: No tracheal deviation.  Cardiovascular:     Rate and Rhythm: Normal rate and regular rhythm.     Heart sounds: Normal heart sounds. No murmur. No gallop.   Pulmonary:     Effort: Pulmonary effort is normal. No respiratory distress.     Breath sounds: Normal breath sounds. No wheezing or rales.  Abdominal:     General: Bowel sounds are normal. There is no distension.     Palpations: Abdomen is soft.     Tenderness: There is no abdominal tenderness. There is no rebound.  Musculoskeletal: Normal range of motion.        General: No tenderness.     Right lower leg: Edema present.     Left lower leg: Edema present.     Comments: Left leg edema, ulcer and erythema.  Chronic skin changes.  Skin:    Findings: No erythema or rash.  Neurological:     General: No focal deficit present.     Mental Status: He is alert and oriented to person, place, and time.     Cranial Nerves: No cranial nerve deficit.  Psychiatric:        Mood and Affect: Mood normal.    LABORATORY PANEL:  Male CBC Recent Labs  Lab 03/19/18 0449  WBC 4.2  HGB 10.1*  HCT 33.6*  PLT 166   ------------------------------------------------------------------------------------------------------------------ Chemistries  Recent Labs  Lab 03/16/18 2245  03/19/18 0449  NA 136   < > 134*  K 3.5   < >  3.8  CL 101   < > 100  CO2 24   < > 27  GLUCOSE 150*   < > 175*  BUN 8   < > 12  CREATININE 0.86   < > 0.88  CALCIUM 8.1*   < > 8.7*  MG  --    < > 1.6*  AST 27  --   --   ALT 16  --   --   ALKPHOS 56  --   --   BILITOT 0.8  --   --    < > = values in this interval not displayed.   RADIOLOGY:  No results found. ASSESSMENT AND PLAN:  [name] [name]  is a 58 y.o. male with a known history of A. fib, asthma, systolic CHF, COPD, sleep apnea-was recently admitted with CHF.  At home he had worsening swelling on his legs for last few days and he had a big blister on his left leg which busted  few days ago and since then there is some redness surrounding there and it is hurting him.  He also complains of getting short of breath with minimal exertion.  *Sepsis Secondary to cellulitis Broad-spectrum antibiotics given by ER. abx is changed to Unasyn. MRSA was negative in recent admissions. Blood cultures are negative so far. Continue Unasyn.  Lactic acidosis due to above.  Improved with treatment.  *Acute respiratory failure with hypoxia, possible due to acute on chronic systolic congestive heart failure and OSA 30% EF as per recent echocardiogram. Continue IV Lasix if BP allows, intake and output monitoring, fluid restriction, daily weight Counseled about fluid restriction and restricted salt intake in diet. Continue home medications.  Already on Entresto. Lasix is increased to 80 mg IV twice daily per cardiology consult.  *Atrial fibrillation with RVR, heart rate is better controlled at about 100. Continue metoprolol and Xarelto.  Decreased Lopressor to 50 mg twice daily due to soft blood pressure.   *Hyperglycemia due to DM2. Hemoglobin A1c: 6.9.   Hyperglycemia improved.  The patient does not want sliding scale.  *COPD Continue inhalers as home, no exacerbation at this time.  * Sleep apnea   CPAP as home regimen.  Morbid obesity.  Diet control and follow-up PCP.  Hypomagnesemia.  Magnesium still low at 1.6, 1 more dose of IV magnesium.  The patient said he has no PCP and cardiologist.  He recently moved to this area from [place].  He will need set up PCP and cardiology appointment on discharge. All the records are reviewed and case discussed with Care Management/Social Worker. Management plans discussed with the patient, family and they are in agreement.  CODE STATUS: Full Code  TOTAL TIME TAKING CARE OF THIS PATIENT: 30 minutes.   More than 50% of the time was spent in counseling/coordination of care: YES  POSSIBLE D/C IN 2 DAYS, DEPENDING ON CLINICAL  CONDITION.   [name] [name] M.D on 03/19/2018 at 11:32 AM  Between 7am to 6pm - Pager - [phone]  After 6pm go to [email or web address] - Therapist, nutritional Hospitalists

## 2018-03-20 LAB — CBC WITH DIFFERENTIAL/PLATELET
Abs Immature Granulocytes: 0.04 10*3/uL (ref 0.00–0.07)
BASOS ABS: 0 10*3/uL (ref 0.0–0.1)
Basophils Relative: 1 %
EOS PCT: 6 %
Eosinophils Absolute: 0.3 10*3/uL (ref 0.0–0.5)
HCT: 35.6 % — ABNORMAL LOW (ref 39.0–52.0)
Hemoglobin: 10.5 g/dL — ABNORMAL LOW (ref 13.0–17.0)
Immature Granulocytes: 1 %
Lymphocytes Relative: 17 %
Lymphs Abs: 1 10*3/uL (ref 0.7–4.0)
MCH: 25.5 pg — ABNORMAL LOW (ref 26.0–34.0)
MCHC: 29.5 g/dL — ABNORMAL LOW (ref 30.0–36.0)
MCV: 86.4 fL (ref 80.0–100.0)
Monocytes Absolute: 0.7 10*3/uL (ref 0.1–1.0)
Monocytes Relative: 13 %
Neutro Abs: 3.6 10*3/uL (ref 1.7–7.7)
Neutrophils Relative %: 62 %
Platelets: 184 10*3/uL (ref 150–400)
RBC: 4.12 MIL/uL — ABNORMAL LOW (ref 4.22–5.81)
RDW: 20.2 % — ABNORMAL HIGH (ref 11.5–15.5)
WBC: 5.7 10*3/uL (ref 4.0–10.5)
nRBC: 0 % (ref 0.0–0.2)

## 2018-03-20 LAB — BASIC METABOLIC PANEL
Anion gap: 8 (ref 5–15)
BUN: 14 mg/dL (ref 6–20)
CALCIUM: 9.1 mg/dL (ref 8.9–10.3)
CHLORIDE: 97 mmol/L — AB (ref 98–111)
CO2: 31 mmol/L (ref 22–32)
CREATININE: 0.89 mg/dL (ref 0.61–1.24)
GFR calc Af Amer: >60 mL/min (ref >60)
GFR calc non Af Amer: >60 mL/min (ref >60)
Glucose, Bld: 115 mg/dL — ABNORMAL HIGH (ref 70–99)
Potassium: 4.2 mmol/L (ref 3.5–5.1)
Sodium: 136 mmol/L (ref 135–145)

## 2018-03-20 LAB — MAGNESIUM: Magnesium: 1.7 mg/dL (ref 1.7–2.4)

## 2018-03-20 MED ORDER — MORPHINE SULFATE (PF) 2 MG/ML IV SOLN
2.0000 mg | INTRAVENOUS | Status: DC | PRN
  Administered 2018-03-22 – 2018-03-23 (×4): 2 mg via INTRAVENOUS
  Filled 2018-03-20 (×4): qty 1

## 2018-03-20 MED ORDER — METOPROLOL SUCCINATE ER 50 MG PO TB24
50.0000 mg | ORAL_TABLET | Freq: Two times a day (BID) | ORAL | Status: DC
  Administered 2018-03-20 – 2018-03-25 (×8): 50 mg via ORAL
  Filled 2018-03-20 (×9): qty 1

## 2018-03-20 MED ORDER — FUROSEMIDE 10 MG/ML IJ SOLN
80.0000 mg | Freq: Two times a day (BID) | INTRAMUSCULAR | Status: DC
  Administered 2018-03-20: 80 mg via INTRAVENOUS
  Filled 2018-03-20: qty 8

## 2018-03-20 MED ORDER — SPIRONOLACTONE 25 MG PO TABS
25.0000 mg | ORAL_TABLET | Freq: Every day | ORAL | Status: DC
  Administered 2018-03-20 – 2018-03-25 (×6): 25 mg via ORAL
  Filled 2018-03-20 (×6): qty 1

## 2018-03-20 NOTE — Progress Notes (Signed)
Progress Note  Patient Name: [name] [name] Date of Encounter: 03/20/2018  Primary Cardiologist: new   Subjective   He still complains of significant shortness of breath.  He reports with his dry weight is around 377 lbs.  He is 383 pounds today.[name] He continues to have leg edema.  He complains of palpitations with exertion.  Inpatient Medications    Scheduled Meds: . furosemide  80 mg Intravenous Q12H  . mouth rinse  15 mL Mouth Rinse BID  . metoprolol succinate  50 mg Oral BID  . potassium chloride  20 mEq Oral BID  . rivaroxaban  20 mg Oral Q supper  . sacubitril-valsartan  1 tablet Oral BID  . spironolactone  25 mg Oral Daily   Continuous Infusions: . sodium chloride 50 mL (03/19/18 0054)  . ampicillin-sulbactam (UNASYN) IV 3 g (03/20/18 1011)   PRN Meds: sodium chloride, acetaminophen, albuterol, bisacodyl, nitroGLYCERIN, ondansetron (ZOFRAN) IV, senna, traZODone   Vital Signs    Vitals:   03/20/18 0405 03/20/18 0638 03/20/18 0641 03/20/18 0758  BP: 104/62 (!) 146/105 (!) 158/127 111/86  Pulse: (!) 103 (!) 136 (!) 115 68  Resp: (!) 21     Temp: 98.2 F (36.8 C)   98.4 F (36.9 C)  TempSrc:    Oral  SpO2: 96%   99%  Weight: (!) 174 kg     Height:        Intake/Output Summary (Last 24 hours) at 03/20/2018 1013 Last data filed at 03/20/2018 0949 Gross per 24 hour  Intake 1380 ml  Output 2795 ml  Net -1415 ml   Last 3 Weights 03/20/2018 03/19/2018 03/18/2018  Weight (lbs) 383 lb 8.9 oz 382 lb 13.3 oz 385 lb 9.6 oz  Weight (kg) 173.98 kg 173.651 kg 174.907 kg      Telemetry    Atrial fibrillation with intermittent tachycardia- Personally Reviewed  ECG    Not performed today- Personally Reviewed  Physical Exam   GEN: No acute distress.   Neck:  Jugular venous pressure is not well visualized Cardiac:  Irregularly irregular, no murmurs, rubs, or gallops.  Respiratory: Clear to auscultation bilaterally. GI: Soft, nontender, non-distended  MS:  Mild to  moderate bilateral leg edema; No deformity. Neuro:  Nonfocal  Psych: Normal affect   Labs    Chemistry Recent Labs  Lab 03/16/18 2245 03/18/18 0338 03/19/18 0449 03/20/18 0457  NA 136 136 134* 136  K 3.5 3.9 3.8 4.2  CL 101 98 100 97*  CO2 24 29 27 31   GLUCOSE 150* 146* 175* 115*  BUN 8 9 12 14   CREATININE 0.86 0.92 0.88 0.89  CALCIUM 8.1* 8.9 8.7* 9.1  PROT 6.5  --   --   --   ALBUMIN 3.3*  --   --   --   AST 27  --   --   --   ALT 16  --   --   --   ALKPHOS 56  --   --   --   BILITOT 0.8  --   --   --   GFRNONAA >60 >60 >60 >60  GFRAA >60 >60 >60 >60  ANIONGAP 11 9 7 8      Hematology Recent Labs  Lab 03/16/18 2245 03/19/18 0449 03/20/18 0457  WBC 7.0 4.2 5.7  RBC 4.19* 4.02* 4.12*  HGB 10.4* 10.1* 10.5*  HCT 34.3* 33.6* 35.6*  MCV 81.9 83.6 86.4  MCH 24.8* 25.1* 25.5*  MCHC 30.3 30.1 29.5*  RDW  20.0* 19.9* 20.2*  PLT 215 166 184    Cardiac Enzymes Recent Labs  Lab 03/16/18 2245  TROPONINI 0.04*   No results for input(s): TROPIPOC in the last 168 hours.   BNP Recent Labs  Lab 03/16/18 2245  BNP 257.0*     DDimer No results for input(s): DDIMER in the last 168 hours.   Radiology    No results found.  Cardiac Studies   Echo 01/17/18  Study Conclusions  - Left ventricle: The cavity size was moderately dilated. Systolic function was moderately to severely reduced. The estimated ejection fraction was 30%. Diffuse hypokinesis. - Right ventricle: The cavity size was moderately dilated.  Impressions:  - 4 chamber dilation with severe LV systolic dysfunction with assesement of LV function by contrast. Sub -optimal study. Addvise cardiology consult.  Patient Profile     58 y.o. male with chronic systolic HF who presented with acute on chronic SOB.  Some evidence of volume overload.  Also, with morbid obesity.  He also has chronic atrial fib.    Assessment & Plan    1.  Acute on chronic systolic heart failure: The patient  continues to be volume overloaded and above his dry weight.  I recommend continuing intravenous diuresis.  I switch metoprolol to Toprol.  Continue Entresto.  I added small dose spironolactone. The patient will need close follow-up with [place]. He reports having cardiac catheterization twice in the past with no obstructive disease. The patient moved recently from [place] but will be staying here in [place].  2.  Chronic atrial fibrillation: Being treated with rate control with Toprol.  Continue anticoagulation with Xarelto.  3.  Morbid obesity: Contributing to both of the above cardiac problems.  4.  Sleep apnea: He uses CPAP on a regular basis.     For questions or updates, please contact [facility] Please consult [email or web address] for contact info under        Signed, Lorine Bears, MD  03/20/2018, 10:13 AM

## 2018-03-20 NOTE — Plan of Care (Signed)
  Problem: Education: Goal: Knowledge of General Education information will improve Description Including pain rating scale, medication(s)/side effects and non-pharmacologic comfort measures Outcome: Progressing   Problem: Health Behavior/Discharge Planning: Goal: Ability to manage health-related needs will improve Outcome: Progressing   Problem: Clinical Measurements: Goal: Ability to maintain clinical measurements within normal limits will improve Outcome: Progressing Goal: Will remain free from infection Outcome: Progressing Goal: Diagnostic test results will improve Outcome: Progressing Goal: Respiratory complications will improve Outcome: Progressing Goal: Cardiovascular complication will be avoided Outcome: Progressing   Problem: Activity: Goal: Risk for activity intolerance will decrease Outcome: Progressing   Problem: Nutrition: Goal: Adequate nutrition will be maintained Outcome: Progressing   Problem: Coping: Goal: Level of anxiety will decrease Outcome: Progressing   Problem: Elimination: Goal: Will not experience complications related to bowel motility Outcome: Progressing Goal: Will not experience complications related to urinary retention Outcome: Progressing   Problem: Pain Managment: Goal: General experience of comfort will improve Outcome: Progressing   Problem: Safety: Goal: Ability to remain free from injury will improve Outcome: Progressing   Problem: Skin Integrity: Goal: Risk for impaired skin integrity will decrease Outcome: Progressing   Problem: Fluid Volume: Goal: Hemodynamic stability will improve Outcome: Progressing   Problem: Clinical Measurements: Goal: Diagnostic test results will improve Outcome: Progressing Goal: Signs and symptoms of infection will decrease Outcome: Progressing   Problem: Respiratory: Goal: Ability to maintain adequate ventilation will improve Outcome: Progressing   Problem: Spiritual Needs Goal:  Ability to function at adequate level Outcome: Progressing

## 2018-03-20 NOTE — Progress Notes (Signed)
Asked patient to ambulate in hall with me. Declined. States he "feels too weak" and is hurting in his arms and L leg. Offered PO pain med, tylenol. Refused.

## 2018-03-20 NOTE — Progress Notes (Signed)
Pt refusing blood sugar checks.

## 2018-03-20 NOTE — Care Management Note (Signed)
Case Management Note  Patient Details  Name: [name] [name] MRN: [id number] Date of Birth: 05-14-1960  Subjective/Objective:   Patient is from home with girlfriend.  He has had multiple admissions in the last couple of months.  Admitted with acute on chronic CHF.  He now has [place] Medicaid.  He is not on oxygen at home.  He is currently on 2L acute oxygen.  Uses a CPAP HS at home.  Has a scale but does not weigh daily.  Explained importance of daily weights.  Does not have a PCP; will ask Dr. Judithann Sheen to sign off on [facility] orders.  Patient states he is going to make an appointment prior to D/C.   He has a walker and a motorized wheelchair at home.  Uses [facility] for prescriptions.  Offered HH services; he has declined in past admissions.  He is agreeable to RN and PT.  Provided CMS.gov list and explained the [employer].  He would like to use [facility].  Referral made to [facility] and accepted.  Will notify her when patient discharges.              Action/Plan:   Expected Discharge Date:                  Expected Discharge Plan:  Home w Home Health Services  In-House Referral:     Discharge planning Services  CM Consult, HF Clinic  Post Acute Care Choice:  Home Health Choice offered to:  Patient  DME Arranged:    DME Agency:     HH Arranged:  RN, PT HH Agency:  [facility] (now [facility])  Status of Service:  Completed, signed off  If discussed at Long Length of Stay Meetings, dates discussed:    Additional Comments:  Sherren Kerns, RN 03/20/2018, 3:33 PM

## 2018-03-20 NOTE — Progress Notes (Signed)
Pt requested "morphine" for pain. Informed pt he only has PO tylenol ordered. Refused.

## 2018-03-20 NOTE — Progress Notes (Signed)
[employer] - [place] at [facility]   PATIENT NAME: [name] [name]    MR#:  [id number]  DATE OF BIRTH:  22-May-1960  SUBJECTIVE:  CHIEF COMPLAINT:   Chief Complaint  Patient presents with  . Chest Pain  The patient is still shortness of breath and has leg swelling REVIEW OF SYSTEMS:  Review of Systems  Constitutional: Positive for malaise/fatigue. Negative for chills and fever.  HENT: Negative for sore throat.   Eyes: Negative for blurred vision and double vision.  Respiratory: Positive for shortness of breath. Negative for cough, hemoptysis, wheezing and stridor.   Cardiovascular: Positive for leg swelling. Negative for chest pain, palpitations and orthopnea.  Gastrointestinal: Negative for abdominal pain, blood in stool, diarrhea, melena, nausea and vomiting.  Genitourinary: Negative for dysuria, flank pain and hematuria.  Musculoskeletal: Negative for back pain and joint pain.       Left leg pain, swelling and ulcer.  Skin: Negative for rash.  Neurological: Negative for dizziness, sensory change, focal weakness, seizures, loss of consciousness, weakness and headaches.  Endo/Heme/Allergies: Negative for polydipsia.  Psychiatric/Behavioral: Negative for depression. The patient is not nervous/anxious.     DRUG ALLERGIES:  No Known Allergies VITALS:  Blood pressure (!) 134/117, pulse 83, temperature 98.2 F (36.8 C), temperature source Oral, resp. rate (!) 21, height 5\' 8"  (1.727 m), weight (!) 174 kg, SpO2 98 %. PHYSICAL EXAMINATION:  Physical Exam Constitutional:      General: He is not in acute distress.    Comments: Morbid obesity  HENT:     Head: Normocephalic.     Mouth/Throat:     Mouth: Mucous membranes are moist.  Eyes:     General: No scleral icterus.    Conjunctiva/sclera: Conjunctivae normal.     Pupils: Pupils are equal, round, and reactive to light.  Neck:     Musculoskeletal: Normal range of motion and neck supple.     Vascular: No  JVD.     Trachea: No tracheal deviation.  Cardiovascular:     Rate and Rhythm: Normal rate and regular rhythm.     Heart sounds: Normal heart sounds. No murmur. No gallop.   Pulmonary:     Effort: Pulmonary effort is normal. No respiratory distress.     Breath sounds: Normal breath sounds. No wheezing or rales.  Abdominal:     General: Bowel sounds are normal. There is no distension.     Palpations: Abdomen is soft.     Tenderness: There is no abdominal tenderness. There is no rebound.  Musculoskeletal: Normal range of motion.        General: No tenderness.     Right lower leg: Edema present.     Left lower leg: Edema present.     Comments: Left leg edema, ulcer and erythema.  Chronic skin changes.  Skin:    Findings: No erythema or rash.  Neurological:     General: No focal deficit present.     Mental Status: He is alert and oriented to person, place, and time.     Cranial Nerves: No cranial nerve deficit.  Psychiatric:        Mood and Affect: Mood normal.    LABORATORY PANEL:  Male CBC Recent Labs  Lab 03/20/18 0457  WBC 5.7  HGB 10.5*  HCT 35.6*  PLT 184   ------------------------------------------------------------------------------------------------------------------ Chemistries  Recent Labs  Lab 03/16/18 2245  03/20/18 0457  NA 136   < > 136  K 3.5   < >  4.2  CL 101   < > 97*  CO2 24   < > 31  GLUCOSE 150*   < > 115*  BUN 8   < > 14  CREATININE 0.86   < > 0.89  CALCIUM 8.1*   < > 9.1  MG  --    < > 1.7  AST 27  --   --   ALT 16  --   --   ALKPHOS 56  --   --   BILITOT 0.8  --   --    < > = values in this interval not displayed.   RADIOLOGY:  No results found. ASSESSMENT AND PLAN:  [name] [name]  is a 58 y.o. male with a known history of A. fib, asthma, systolic CHF, COPD, sleep apnea-was recently admitted with CHF.  At home he had worsening swelling on his legs for last few days and he had a big blister on his left leg which busted few days ago  and since then there is some redness surrounding there and it is hurting him.  He also complains of getting short of breath with minimal exertion.  *Sepsis Secondary to cellulitis - Continue Unasyn. - Lactic acidosis due to above.  Improved with treatment.  *Acute respiratory failure with hypoxia, possible due to acute on chronic systolic congestive heart failure and OSA 30% EF as per recent echocardiogram. Continue IV Lasix 80 mg BID if BP allows, intake and output monitoring, fluid restriction, daily weight Counseled about fluid restriction and restricted salt intake in diet. Continue home medications.  Already on Entresto. - Cardio switched metoprolol to Toprol.  Continue Entresto. also added small dose spironolactone.  *Atrial fibrillation with RVR, heart rate is better controlled at about 100. Continue Toprol and Xarelto  *Hyperglycemia due to DM2. Hemoglobin A1c: 6.9.   Hyperglycemia improved.  The patient does not want sliding scale.  *COPD Continue inhalers as home, no exacerbation at this time.  * Sleep apnea   CPAP as home regimen.  Morbid obesity.  Diet control and follow-up PCP.  Hypomagnesemia: replaced   The patient said he has no PCP and cardiologist.  He recently moved to this area from [place].  He will need set up PCP and cardiology appointment on discharge.  All the records are reviewed and case discussed with Care Management/Social Worker. Management plans discussed with the patient, family, cardio and they are in agreement.  CODE STATUS: Full Code  TOTAL TIME TAKING CARE OF THIS PATIENT: 30 minutes.   More than 50% of the time was spent in counseling/coordination of care: YES  POSSIBLE D/C IN 2-3 DAYS, DEPENDING ON CLINICAL CONDITION. And Cardio eval   Delfino Lovett M.D on 03/20/2018 at 4:56 PM  Between 7am to 6pm - Pager - [phone]  After 6pm go to [email or web address] - Therapist, nutritional Hospitalists

## 2018-03-21 DIAGNOSIS — G4733 Obstructive sleep apnea (adult) (pediatric): Secondary | ICD-10-CM

## 2018-03-21 DIAGNOSIS — I482 Chronic atrial fibrillation, unspecified: Secondary | ICD-10-CM

## 2018-03-21 LAB — CULTURE, BLOOD (ROUTINE X 2)
Culture: NO GROWTH
Culture: NO GROWTH
Special Requests: ADEQUATE

## 2018-03-21 LAB — BASIC METABOLIC PANEL
Anion gap: 5 (ref 5–15)
BUN: 13 mg/dL (ref 6–20)
CO2: 31 mmol/L (ref 22–32)
Calcium: 8.9 mg/dL (ref 8.9–10.3)
Chloride: 99 mmol/L (ref 98–111)
Creatinine, Ser: 0.86 mg/dL (ref 0.61–1.24)
GFR calc Af Amer: >60 mL/min (ref >60)
GFR calc non Af Amer: >60 mL/min (ref >60)
Glucose, Bld: 124 mg/dL — ABNORMAL HIGH (ref 70–99)
Potassium: 4.4 mmol/L (ref 3.5–5.1)
Sodium: 135 mmol/L (ref 135–145)

## 2018-03-21 LAB — CBC
HCT: 35.5 % — ABNORMAL LOW (ref 39.0–52.0)
Hemoglobin: 10.6 g/dL — ABNORMAL LOW (ref 13.0–17.0)
MCH: 25.3 pg — ABNORMAL LOW (ref 26.0–34.0)
MCHC: 29.9 g/dL — ABNORMAL LOW (ref 30.0–36.0)
MCV: 84.7 fL (ref 80.0–100.0)
Platelets: 197 10*3/uL (ref 150–400)
RBC: 4.19 MIL/uL — ABNORMAL LOW (ref 4.22–5.81)
RDW: 20.4 % — ABNORMAL HIGH (ref 11.5–15.5)
WBC: 5.6 10*3/uL (ref 4.0–10.5)
nRBC: 0 % (ref 0.0–0.2)

## 2018-03-21 MED ORDER — CEPHALEXIN 500 MG PO CAPS
500.0000 mg | ORAL_CAPSULE | Freq: Three times a day (TID) | ORAL | Status: DC
  Administered 2018-03-21 – 2018-03-24 (×12): 500 mg via ORAL
  Filled 2018-03-21 (×12): qty 1

## 2018-03-21 MED ORDER — FUROSEMIDE 10 MG/ML IJ SOLN
120.0000 mg | Freq: Two times a day (BID) | INTRAVENOUS | Status: DC
  Administered 2018-03-21 – 2018-03-23 (×6): 120 mg via INTRAVENOUS
  Filled 2018-03-21 (×8): qty 12

## 2018-03-21 NOTE — Progress Notes (Signed)
Pt due to get 80mg  IV lasix this morning, however pt's BP is only 109/55 HR 64. Spoke with Dr. Kathe Mariner, states to hold lasix dose for now & to reevaluate around 9am , will pass along to day shift RN & continue to monitor. Shirley Friar, RN, BSN

## 2018-03-21 NOTE — Progress Notes (Signed)
Progress Note  Patient Name: [name] [name] Date of Encounter: 03/21/2018  Primary Cardiologist: New  Subjective   Patient continues to feel short of breath and very tired.  Notes palpitations whenever he moves around.  No further chest pain.  Inpatient Medications    Scheduled Meds: . mouth rinse  15 mL Mouth Rinse BID  . metoprolol succinate  50 mg Oral BID  . potassium chloride  20 mEq Oral BID  . rivaroxaban  20 mg Oral Q supper  . sacubitril-valsartan  1 tablet Oral BID  . spironolactone  25 mg Oral Daily   Continuous Infusions: . sodium chloride Stopped (03/21/18 0347)  . ampicillin-sulbactam (UNASYN) IV 3 g (03/21/18 0347)  . furosemide     PRN Meds: sodium chloride, acetaminophen, albuterol, bisacodyl, morphine injection, nitroGLYCERIN, ondansetron (ZOFRAN) IV, senna, traZODone   Vital Signs    Vitals:   03/20/18 1655 03/20/18 1942 03/20/18 2305 03/21/18 0353  BP: (!) 126/110 110/70  (!) 109/55  Pulse: 83 96 94 64  Resp:  16 16 20   Temp: 98.2 F (36.8 C) 98.1 F (36.7 C)  (!) 97.4 F (36.3 C)  TempSrc: Oral Oral  Oral  SpO2: 98% 100% 97% 100%  Weight:    (!) 174.9 kg  Height:        Intake/Output Summary (Last 24 hours) at 03/21/2018 0807 Last data filed at 03/21/2018 0433 Gross per 24 hour  Intake 1731.74 ml  Output 3975 ml  Net -2243.26 ml   Last 3 Weights 03/21/2018 03/20/2018 03/19/2018  Weight (lbs) 385 lb 8 oz 383 lb 8.9 oz 382 lb 13.3 oz  Weight (kg) 174.862 kg 173.98 kg 173.651 kg      Telemetry    Atrial fibrillation.  Ventricular rate 60 to 80 bpm at rest but spikes to 140 bpm with activity- Personally Reviewed  ECG    No new tracing.  Physical Exam   GEN: No acute distress.   Neck:  Difficult to assess JVP/HJR due to morbid obesity. Cardiac:  Irregularly irregular without murmurs or rubs. Respiratory:  Mildly diminished breath sounds throughout.  No wheezes or crackles. GI:  Obese and firm. MS:  2+ to 3+ chronic edema in both  calves. Neuro:  Nonfocal  Psych: Normal affect   Labs    Chemistry Recent Labs  Lab 03/16/18 2245  03/19/18 0449 03/20/18 0457 03/21/18 0521  NA 136   < > 134* 136 135  K 3.5   < > 3.8 4.2 4.4  CL 101   < > 100 97* 99  CO2 24   < > 27 31 31   GLUCOSE 150*   < > 175* 115* 124*  BUN 8   < > 12 14 13   CREATININE 0.86   < > 0.88 0.89 0.86  CALCIUM 8.1*   < > 8.7* 9.1 8.9  PROT 6.5  --   --   --   --   ALBUMIN 3.3*  --   --   --   --   AST 27  --   --   --   --   ALT 16  --   --   --   --   ALKPHOS 56  --   --   --   --   BILITOT 0.8  --   --   --   --   GFRNONAA >60   < > >60 >60 >60  GFRAA >60   < > >60 >60 >60  ANIONGAP 11   < > 7 8 5    < > = values in this interval not displayed.     Hematology Recent Labs  Lab 03/19/18 0449 03/20/18 0457 03/21/18 0521  WBC 4.2 5.7 5.6  RBC 4.02* 4.12* 4.19*  HGB 10.1* 10.5* 10.6*  HCT 33.6* 35.6* 35.5*  MCV 83.6 86.4 84.7  MCH 25.1* 25.5* 25.3*  MCHC 30.1 29.5* 29.9*  RDW 19.9* 20.2* 20.4*  PLT 166 184 197    Cardiac Enzymes Recent Labs  Lab 03/16/18 2245  TROPONINI 0.04*   No results for input(s): TROPIPOC in the last 168 hours.   BNP Recent Labs  Lab 03/16/18 2245  BNP 257.0*     DDimer No results for input(s): DDIMER in the last 168 hours.   Radiology    No results found.  Cardiac Studies   TEE (01/17/2018): - Left ventricle: The cavity size was moderately dilated. Systolic function was moderately to severely reduced. The estimated ejection fraction was 30%. Diffuse hypokinesis. - Right ventricle: The cavity size was moderately dilated.  Patient Profile     58 y.o. male with chronic systolic HF who presented with acute on chronic SOB. Some evidence of volume overload. Also, with morbid obesity. He also has chronic atrial fib.  Assessment & Plan    Acute on chronic systolic heart failure Patient still feels volume overloaded, though he notes his breathing is a little better.  He has 2-3+  pitting edema in his legs.  JVP is hard to assess because of morbid obesity.  Despite negative fluid balance, his weight remains stable.  Furosemide was also held this morning due to low normal blood pressure.  Escalate Frohse my to 120 mg IV twice daily.  Importance of monitoring fluid intake also reinforced.  I do not think that his blood pressure readings this morning preclude administration of furosemide.  Continue Entresto, metoprolol, and spironolactone.  If hypotension becomes an issue, we may need to convert Entresto to losartan in the short-term.  Chronic atrial fibrillation Heart rate well controlled at rest, though spikes with activity continue.  Patient does not believe he has ever undergone cardioversion or had an attempt at rhythm control.  In the setting of his severe systolic heart failure, he may benefit from restoration of sinus rhythm, though maintenance of this with his comorbidities may be challenging.  Continue metoprolol succinate 50 mg twice daily.  Continue rivaroxaban.  Consider EP consultation to discuss antiarrhythmic therapy and cardioversion once the patient's decompensated heart failure has resolved.  Orbit obesity Weight loss encouraged, though patient states he is limited due to shortness of breath.  Obstructive sleep apnea  Continue CPAP use.  For questions or updates, please contact [facility] Please consult [email or web address] for contact info under [facility].    Signed, [name], MD  03/21/2018, 8:07 AM

## 2018-03-21 NOTE — Plan of Care (Signed)
  Problem: Activity: Goal: Risk for activity intolerance will decrease Outcome: Progressing Note:  Up from bed to chair independently   Problem: Nutrition: Goal: Adequate nutrition will be maintained Outcome: Progressing   Problem: Coping: Goal: Level of anxiety will decrease Outcome: Progressing   Problem: Elimination: Goal: Will not experience complications related to urinary retention Outcome: Progressing Note:  Receiving IV lasix with good output   Problem: Pain Managment: Goal: General experience of comfort will improve Outcome: Progressing   Problem: Safety: Goal: Ability to remain free from injury will improve Outcome: Progressing   Problem: Skin Integrity: Goal: Risk for impaired skin integrity will decrease Outcome: Progressing Note:  Left leg wound with daily dressing changes   Problem: Clinical Measurements: Goal: Diagnostic test results will improve Outcome: Progressing Note:  Receiving IV abx. Lactic acid back WDL   Problem: Education: Goal: Knowledge of General Education information will improve Description Including pain rating scale, medication(s)/side effects and non-pharmacologic comfort measures Outcome: Completed/Met

## 2018-03-21 NOTE — Progress Notes (Signed)
[employer] - [place] at [facility]   PATIENT NAME: [name] [name]    MR#:  [id number]  DATE OF BIRTH:  09/10/1960  SUBJECTIVE:  CHIEF COMPLAINT:   Chief Complaint  Patient presents with  . Chest Pain  shortness of breath and leg swelling + REVIEW OF SYSTEMS:  Review of Systems  Constitutional: Positive for malaise/fatigue. Negative for chills and fever.  HENT: Negative for sore throat.   Eyes: Negative for blurred vision and double vision.  Respiratory: Positive for shortness of breath. Negative for cough, hemoptysis, wheezing and stridor.   Cardiovascular: Positive for leg swelling. Negative for chest pain, palpitations and orthopnea.  Gastrointestinal: Negative for abdominal pain, blood in stool, diarrhea, melena, nausea and vomiting.  Genitourinary: Negative for dysuria, flank pain and hematuria.  Musculoskeletal: Negative for back pain and joint pain.       Left leg pain, swelling and ulcer.  Skin: Negative for rash.  Neurological: Negative for dizziness, sensory change, focal weakness, seizures, loss of consciousness, weakness and headaches.  Endo/Heme/Allergies: Negative for polydipsia.  Psychiatric/Behavioral: Negative for depression. The patient is not nervous/anxious.    DRUG ALLERGIES:  No Known Allergies VITALS:  Blood pressure 112/63, pulse (!) 58, temperature 98.2 F (36.8 C), temperature source Oral, resp. rate 18, height 5\' 8"  (1.727 m), weight (!) 174.9 kg, SpO2 98 %. PHYSICAL EXAMINATION:  Physical Exam Constitutional:      General: He is not in acute distress.    Comments: Morbid obesity  HENT:     Head: Normocephalic.     Mouth/Throat:     Mouth: Mucous membranes are moist.  Eyes:     General: No scleral icterus.    Conjunctiva/sclera: Conjunctivae normal.     Pupils: Pupils are equal, round, and reactive to light.  Neck:     Musculoskeletal: Normal range of motion and neck supple.     Vascular: No JVD.     Trachea: No  tracheal deviation.  Cardiovascular:     Rate and Rhythm: Normal rate and regular rhythm.     Heart sounds: Normal heart sounds. No murmur. No gallop.   Pulmonary:     Effort: Pulmonary effort is normal. No respiratory distress.     Breath sounds: Normal breath sounds. No wheezing or rales.  Abdominal:     General: Bowel sounds are normal. There is no distension.     Palpations: Abdomen is soft.     Tenderness: There is no abdominal tenderness. There is no rebound.  Musculoskeletal: Normal range of motion.        General: No tenderness.     Right lower leg: Edema present.     Left lower leg: Edema present.     Comments: Left leg edema, ulcer and erythema.  Chronic skin changes.  Skin:    Findings: No erythema or rash.  Neurological:     General: No focal deficit present.     Mental Status: He is alert and oriented to person, place, and time.     Cranial Nerves: No cranial nerve deficit.  Psychiatric:        Mood and Affect: Mood normal.    LABORATORY PANEL:  Male CBC Recent Labs  Lab 03/21/18 0521  WBC 5.6  HGB 10.6*  HCT 35.5*  PLT 197   ------------------------------------------------------------------------------------------------------------------ Chemistries  Recent Labs  Lab 03/16/18 2245  03/20/18 0457 03/21/18 0521  NA 136   < > 136 135  K 3.5   < > 4.2 4.4  CL 101   < > 97* 99  CO2 24   < > 31 31  GLUCOSE 150*   < > 115* 124*  BUN 8   < > 14 13  CREATININE 0.86   < > 0.89 0.86  CALCIUM 8.1*   < > 9.1 8.9  MG  --    < > 1.7  --   AST 27  --   --   --   ALT 16  --   --   --   ALKPHOS 56  --   --   --   BILITOT 0.8  --   --   --    < > = values in this interval not displayed.   RADIOLOGY:  No results found. ASSESSMENT AND PLAN:  [name] [name]  is a 58 y.o. male with a known history of A. fib, asthma, systolic CHF, COPD, sleep apnea-was recently admitted with CHF.  At home he had worsening swelling on his legs for last few days and he had a big  blister on his left leg which busted few days ago and since then there is some redness surrounding there and it is hurting him.  He also complains of getting short of breath with minimal exertion.  *Sepsis Secondary to cellulitis - Continue Unasyn. - Lactic acidosis due to above.  Improved with treatment.  *Acute respiratory failure with hypoxia, possible due to acute on chronic systolic congestive heart failure and OSA 30% EF as per recent echocardiogram. - Increase IV Lasix to 120 mg BID if BP allows (held this am due to low BP), intake and output monitoring, fluid restriction, daily weight Counseled about fluid restriction and restricted salt intake in diet. - Continue Entresto, metoprolol, and spironolactone - Cardio switched metoprolol to Toprol.  Continue Entresto. also added small dose spironolactone.   *Atrial fibrillation with RVR, heart rate is better controlled at about 100. Continue Metoprolol and Xarelto, Cardio considering EP eval - may be as an outpt  *Hyperglycemia due to DM2. Hemoglobin A1c: 6.9.   Hyperglycemia improved.  The patient does not want sliding scale.  *COPD Continue inhalers as home, no exacerbation at this time.  * Sleep apnea   CPAP as home regimen.  Morbid obesity.  Diet control and follow-up PCP.  Hypomagnesemia: replaced   The patient said he has no PCP and cardiologist.  He recently moved to this area from [place].  He will need set up PCP and cardiology appointment on discharge.  All the records are reviewed and case discussed with Care Management/Social Worker. Management plans discussed with the patient, cardio and they are in agreement.  CODE STATUS: Full Code  TOTAL TIME TAKING CARE OF THIS PATIENT: 30 minutes.   More than 50% of the time was spent in counseling/coordination of care: YES  POSSIBLE D/C IN 2-3 DAYS, DEPENDING ON CLINICAL CONDITION. And Cardio eval   Delfino Lovett M.D on 03/21/2018 at 6:35 PM  Between 7am to 6pm -  Pager - [phone]  After 6pm go to [email or web address] - Therapist, nutritional Hospitalists

## 2018-03-22 DIAGNOSIS — I4819 Other persistent atrial fibrillation: Secondary | ICD-10-CM

## 2018-03-22 LAB — BASIC METABOLIC PANEL
Anion gap: 7 (ref 5–15)
BUN: 15 mg/dL (ref 6–20)
CO2: 30 mmol/L (ref 22–32)
Calcium: 8.8 mg/dL — ABNORMAL LOW (ref 8.9–10.3)
Chloride: 97 mmol/L — ABNORMAL LOW (ref 98–111)
Creatinine, Ser: 0.97 mg/dL (ref 0.61–1.24)
GFR calc Af Amer: >60 mL/min (ref >60)
GFR calc non Af Amer: >60 mL/min (ref >60)
Glucose, Bld: 106 mg/dL — ABNORMAL HIGH (ref 70–99)
Potassium: 4 mmol/L (ref 3.5–5.1)
Sodium: 134 mmol/L — ABNORMAL LOW (ref 135–145)

## 2018-03-22 LAB — CBC
HCT: 39 % (ref 39.0–52.0)
Hemoglobin: 11.4 g/dL — ABNORMAL LOW (ref 13.0–17.0)
MCH: 25 pg — ABNORMAL LOW (ref 26.0–34.0)
MCHC: 29.2 g/dL — ABNORMAL LOW (ref 30.0–36.0)
MCV: 85.5 fL (ref 80.0–100.0)
NRBC: 0 % (ref 0.0–0.2)
Platelets: 225 10*3/uL (ref 150–400)
RBC: 4.56 MIL/uL (ref 4.22–5.81)
RDW: 20.3 % — AB (ref 11.5–15.5)
WBC: 6.2 10*3/uL (ref 4.0–10.5)

## 2018-03-22 NOTE — Progress Notes (Signed)
[employer] - [place] at [facility]   PATIENT NAME: [name] [name]    MR#:  [id number]  DATE OF BIRTH:  Feb 03, 1961  SUBJECTIVE:  CHIEF COMPLAINT:   Chief Complaint  Patient presents with  . Chest Pain  shortness of breath and leg swelling + , slowly improving, sitting in chair REVIEW OF SYSTEMS:  Review of Systems  Constitutional: Positive for malaise/fatigue. Negative for chills and fever.  HENT: Negative for sore throat.   Eyes: Negative for blurred vision and double vision.  Respiratory: Positive for shortness of breath. Negative for cough, hemoptysis, wheezing and stridor.   Cardiovascular: Positive for leg swelling. Negative for chest pain, palpitations and orthopnea.  Gastrointestinal: Negative for abdominal pain, blood in stool, diarrhea, melena, nausea and vomiting.  Genitourinary: Negative for dysuria, flank pain and hematuria.  Musculoskeletal: Negative for back pain and joint pain.       Left leg pain, swelling and ulcer.  Skin: Negative for rash.  Neurological: Negative for dizziness, sensory change, focal weakness, seizures, loss of consciousness, weakness and headaches.  Endo/Heme/Allergies: Negative for polydipsia.  Psychiatric/Behavioral: Negative for depression. The patient is not nervous/anxious.    DRUG ALLERGIES:  No Known Allergies VITALS:  Blood pressure 115/80, pulse 66, temperature 98.4 F (36.9 C), temperature source Oral, resp. rate 16, height 5\' 8"  (1.727 m), weight (!) 173.7 kg, SpO2 100 %. PHYSICAL EXAMINATION:  Physical Exam Constitutional:      General: He is not in acute distress.    Comments: Morbid obesity  HENT:     Head: Normocephalic.     Mouth/Throat:     Mouth: Mucous membranes are moist.  Eyes:     General: No scleral icterus.    Conjunctiva/sclera: Conjunctivae normal.     Pupils: Pupils are equal, round, and reactive to light.  Neck:     Musculoskeletal: Normal range of motion and neck supple.   Vascular: No JVD.     Trachea: No tracheal deviation.  Cardiovascular:     Rate and Rhythm: Normal rate and regular rhythm.     Heart sounds: Normal heart sounds. No murmur. No gallop.   Pulmonary:     Effort: Pulmonary effort is normal. No respiratory distress.     Breath sounds: Normal breath sounds. No wheezing or rales.  Abdominal:     General: Bowel sounds are normal. There is no distension.     Palpations: Abdomen is soft.     Tenderness: There is no abdominal tenderness. There is no rebound.  Musculoskeletal: Normal range of motion.        General: No tenderness.     Right lower leg: Edema present.     Left lower leg: Edema present.     Comments: Left leg edema, ulcer and erythema.  Chronic skin changes.  Skin:    Findings: No erythema or rash.  Neurological:     General: No focal deficit present.     Mental Status: He is alert and oriented to person, place, and time.     Cranial Nerves: No cranial nerve deficit.  Psychiatric:        Mood and Affect: Mood normal.    LABORATORY PANEL:  Male CBC Recent Labs  Lab 03/22/18 0515  WBC 6.2  HGB 11.4*  HCT 39.0  PLT 225   ------------------------------------------------------------------------------------------------------------------ Chemistries  Recent Labs  Lab 03/16/18 2245  03/20/18 0457  03/22/18 0515  NA 136   < > 136   < > 134*  K  3.5   < > 4.2   < > 4.0  CL 101   < > 97*   < > 97*  CO2 24   < > 31   < > 30  GLUCOSE 150*   < > 115*   < > 106*  BUN 8   < > 14   < > 15  CREATININE 0.86   < > 0.89   < > 0.97  CALCIUM 8.1*   < > 9.1   < > 8.8*  MG  --    < > 1.7  --   --   AST 27  --   --   --   --   ALT 16  --   --   --   --   ALKPHOS 56  --   --   --   --   BILITOT 0.8  --   --   --   --    < > = values in this interval not displayed.   RADIOLOGY:  No results found. ASSESSMENT AND PLAN:  Marques Luthman  is a 58 y.o. male with a known history of A. fib, asthma, systolic CHF, COPD, sleep apnea-was  recently admitted with CHF.  At home he had worsening swelling on his legs for last few days and he had a big blister on his left leg which busted few days ago and since then there is some redness surrounding there and it is hurting him.  He also complains of getting short of breath with minimal exertion.  *Sepsis: resolved Secondary to cellulitis - Continue Unasyn. - Lactic acidosis due to above.  Improved with treatment.  *Acute respiratory failure with hypoxia, possible due to acute on chronic systolic congestive heart failure and OSA 30% EF as per recent echocardiogram. - Continue IV Lasix to 120 mg BID if BP allows, intake and output monitoring, fluid restriction, daily weight Counseled about fluid restriction and restricted salt intake in diet. - Continue Entresto, metoprolol, and spironolactone - Continue Toprol.  Continue Entresto. also small dose spironolactone. - Neg 8.6 liters  *Atrial fibrillation with RVR, heart rate is better controlled at about 100. Continue Metoprolol and Xarelto, Cardio considering EP eval - may be as an outpt  *Hyperglycemia due to DM2. Hemoglobin A1c: 6.9.   Hyperglycemia improved.  The patient does not want sliding scale.  *COPD Continue inhalers as home, no exacerbation at this time.  * Sleep apnea   CPAP as home regimen.  Morbid obesity.  Diet control and follow-up PCP.  Hypomagnesemia: replaced   The patient said he has no PCP and cardiologist.  He recently moved to this area from [place].  He will need set up PCP and cardiology appointment on discharge.   All the records are reviewed and case discussed with Care Management/Social Worker. Management plans discussed with the patient, cardio and they are in agreement.  CODE STATUS: Full Code  TOTAL TIME TAKING CARE OF THIS PATIENT: 30 minutes.   More than 50% of the time was spent in counseling/coordination of care: YES  POSSIBLE D/C IN 1-2 DAYS, DEPENDING ON CLINICAL CONDITION.  And Cardio eval   Delfino Lovett M.D on 03/22/2018 at 4:43 PM  Between 7am to 6pm - Pager - [phone]  After 6pm go to [email or web address] - Therapist, nutritional Hospitalists

## 2018-03-22 NOTE — Plan of Care (Signed)
  Problem: Clinical Measurements: Goal: Respiratory complications will improve Outcome: Progressing Note:  CPAP at night   Problem: Activity: Goal: Risk for activity intolerance will decrease Outcome: Progressing Note:  Up in room standby assist   Problem: Coping: Goal: Level of anxiety will decrease Outcome: Progressing   Problem: Elimination: Goal: Will not experience complications related to bowel motility Outcome: Progressing Note:  BM this shift 03/21/2018 Goal: Will not experience complications related to urinary retention Outcome: Progressing   Problem: Pain Managment: Goal: General experience of comfort will improve Outcome: Progressing Note:  No complaints of pain this shift   Problem: Safety: Goal: Ability to remain free from injury will improve Outcome: Progressing   Problem: Skin Integrity: Goal: Risk for impaired skin integrity will decrease Outcome: Progressing Note:  Left leg wound   Problem: Nutrition: Goal: Adequate nutrition will be maintained Outcome: Completed/Met

## 2018-03-22 NOTE — Progress Notes (Signed)
 Progress Note  Patient Name: [name] [name] Date of Encounter: 03/22/2018  Primary Cardiologist: New  Subjective   Patient continues to feel SOB and c/o orthopnea, though both are improving since admission. He continues to have good urine output with diuresis. He reported DOE, tachycardia, and feeling his heart flutter when ambulating. Reports chronic left epigastric pain that he says is not new for him.   Inpatient Medications    Scheduled Meds: . cephALEXin  500 mg Oral TID AC & HS  . mouth rinse  15 mL Mouth Rinse BID  . metoprolol succinate  50 mg Oral BID  . potassium chloride  20 mEq Oral BID  . rivaroxaban  20 mg Oral Q supper  . sacubitril-valsartan  1 tablet Oral BID  . spironolactone  25 mg Oral Daily   Continuous Infusions: . sodium chloride Stopped (03/21/18 0347)  . furosemide 120 mg (03/22/18 0857)   PRN Meds: sodium chloride, acetaminophen, albuterol, bisacodyl, morphine injection, nitroGLYCERIN, ondansetron (ZOFRAN) IV, senna, traZODone   Vital Signs    Vitals:   03/21/18 1920 03/22/18 0506 03/22/18 0846 03/22/18 0849  BP: (!) 127/98 (!) 92/59 91/67 115/77  Pulse: 91 67 63 90  Resp: 16 16 16   Temp: 98.9 F (37.2 C) 98.5 F (36.9 C) 98.6 F (37 C)   TempSrc: Oral Oral Oral   SpO2: 97% 100% 99%   Weight:  (!) 173.7 kg    Height:        Intake/Output Summary (Last 24 hours) at 03/22/2018 1302 Last data filed at 03/22/2018 0955 Gross per 24 hour  Intake 641.61 ml  Output 2825 ml  Net -2183.39 ml   Last 3 Weights 03/22/2018 03/21/2018 03/20/2018  Weight (lbs) 382 lb 14.4 oz 385 lb 8 oz 383 lb 8.9 oz  Weight (kg) 173.682 kg 174.862 kg 173.98 kg      Telemetry    Atrial fibrillation.  Ventricular rate controlled at rest 60-70s and spiking with ambulation- Personally Reviewed  ECG    No new tracing.  Physical Exam   GEN: No acute distress.  Sitting in chair next to bed Neck:  +HJR  Cardiac:  Irregularly irregular without murmurs or  rubs. Respiratory:  Mildly diminished breath sounds throughout.  No wheezes or crackles. GI:  Obese and firm. MS:  2+ bilateral lower extremity edema in both calves. Bandage on left leg, weeping wound. Neuro:  Nonfocal  Psych: Normal affect   Labs    Chemistry Recent Labs  Lab 03/16/18 2245  03/20/18 0457 03/21/18 0521 03/22/18 0515  NA 136   < > 136 135 134*  K 3.5   < > 4.2 4.4 4.0  CL 101   < > 97* 99 97*  CO2 24   < > 31 31 30  GLUCOSE 150*   < > 115* 124* 106*  BUN 8   < > 14 13 15  CREATININE 0.86   < > 0.89 0.86 0.97  CALCIUM 8.1*   < > 9.1 8.9 8.8*  PROT 6.5  --   --   --   --   ALBUMIN 3.3*  --   --   --   --   AST 27  --   --   --   --   ALT 16  --   --   --   --   ALKPHOS 56  --   --   --   --   BILITOT 0.8  --   --   --   --     GFRNONAA >60   < > >60 >60 >60  GFRAA >60   < > >60 >60 >60  ANIONGAP 11   < > 8 5 7    < > = values in this interval not displayed.     Hematology Recent Labs  Lab 03/20/18 0457 03/21/18 0521 03/22/18 0515  WBC 5.7 5.6 6.2  RBC 4.12* 4.19* 4.56  HGB 10.5* 10.6* 11.4*  HCT 35.6* 35.5* 39.0  MCV 86.4 84.7 85.5  MCH 25.5* 25.3* 25.0*  MCHC 29.5* 29.9* 29.2*  RDW 20.2* 20.4* 20.3*  PLT 184 197 225    Cardiac Enzymes Recent Labs  Lab 03/16/18 2245  TROPONINI 0.04*   No results for input(s): TROPIPOC in the last 168 hours.   BNP Recent Labs  Lab 03/16/18 2245  BNP 257.0*     DDimer No results for input(s): DDIMER in the last 168 hours.   Radiology    No results found.  Cardiac Studies   TEE (01/17/2018): - Left ventricle: The cavity size was moderately dilated. Systolic function was moderately to severely reduced. The estimated ejection fraction was 30%. Diffuse hypokinesis. - Right ventricle: The cavity size was moderately dilated.  Patient Profile     58 y.o. male with chronic systolic HF who presented with acute on chronic SOB. Some evidence of volume overload. Also, with morbid obesity. He also  has chronic atrial fib.  Assessment & Plan    Acute on chronic systolic heart failure Patient has continued to note improvement in symptoms of SOB/DOE and orthopnea.  He still has 2+ pitting edema in his legs, which may be somewhat chronic / baseline for this patient. + HJR.  Urine output remains sufficient at ~2L daily but only down ~3lbs from admission. He continues to have soft SBP per review of vitals. Currently diuresing on 120mg  IV lasix BID.   Recommend continued monitoring fluid intake also reinforced.  Continue furosemide as patient is still volume overloaded and so low BP does not preclude the administration of diuresis. Continue metoprolol and spironolactone. BP today still hypotensive. Consider changing Entresto to losartan in the short-term and until pressures become stable.  Chronic atrial fibrillation Heart rate well controlled at rest, though spikes with activity continue.  Patient does not believe he has ever undergone cardioversion or had an attempt at rhythm control.  In the setting of his severe systolic heart failure, he may benefit from restoration of sinus rhythm if able to hold SR, though maintenance of this with his comorbidities may be challenging.  Continue metoprolol succinate 50 mg twice daily.  Continue rivaroxaban.  As previously noted, future EP consultation recommended to discuss antiarrhythmic therapy and cardioversion once the patient's decompensated heart failure has resolved and to prevent further worsening of heart failure.  Morbid obesity - Weight loss encouraged.  Obstructive sleep apnea  Continue CPAP use.  For questions or updates, please contact [facility] Please consult [email or web address] for contact info under [facility].    Signed, Lennon Alstrom, PA-C  03/22/2018, 1:02 PM

## 2018-03-22 NOTE — H&P (View-Only) (Signed)
Progress Note  Patient Name: [name] [name] Date of Encounter: 03/22/2018  Primary Cardiologist: New  Subjective   Patient continues to feel SOB and c/o orthopnea, though both are improving since admission. He continues to have good urine output with diuresis. He reported DOE, tachycardia, and feeling his heart flutter when ambulating. Reports chronic left epigastric pain that he says is not new for him.   Inpatient Medications    Scheduled Meds: . cephALEXin  500 mg Oral TID AC & HS  . mouth rinse  15 mL Mouth Rinse BID  . metoprolol succinate  50 mg Oral BID  . potassium chloride  20 mEq Oral BID  . rivaroxaban  20 mg Oral Q supper  . sacubitril-valsartan  1 tablet Oral BID  . spironolactone  25 mg Oral Daily   Continuous Infusions: . sodium chloride Stopped (03/21/18 0347)  . furosemide 120 mg (03/22/18 0857)   PRN Meds: sodium chloride, acetaminophen, albuterol, bisacodyl, morphine injection, nitroGLYCERIN, ondansetron (ZOFRAN) IV, senna, traZODone   Vital Signs    Vitals:   03/21/18 1920 03/22/18 0506 03/22/18 0846 03/22/18 0849  BP: (!) 127/98 (!) 92/59 91/67 115/77  Pulse: 91 67 63 90  Resp: 16 16 16    Temp: 98.9 F (37.2 C) 98.5 F (36.9 C) 98.6 F (37 C)   TempSrc: Oral Oral Oral   SpO2: 97% 100% 99%   Weight:  (!) 173.7 kg    Height:        Intake/Output Summary (Last 24 hours) at 03/22/2018 1302 Last data filed at 03/22/2018 0955 Gross per 24 hour  Intake 641.61 ml  Output 2825 ml  Net -2183.39 ml   Last 3 Weights 03/22/2018 03/21/2018 03/20/2018  Weight (lbs) 382 lb 14.4 oz 385 lb 8 oz 383 lb 8.9 oz  Weight (kg) 173.682 kg 174.862 kg 173.98 kg      Telemetry    Atrial fibrillation.  Ventricular rate controlled at rest 60-70s and spiking with ambulation- Personally Reviewed  ECG    No new tracing.  Physical Exam   GEN: No acute distress.  Sitting in chair next to bed Neck:  +HJR  Cardiac:  Irregularly irregular without murmurs or  rubs. Respiratory:  Mildly diminished breath sounds throughout.  No wheezes or crackles. GI:  Obese and firm. MS:  2+ bilateral lower extremity edema in both calves. Bandage on left leg, weeping wound. Neuro:  Nonfocal  Psych: Normal affect   Labs    Chemistry Recent Labs  Lab 03/16/18 2245  03/20/18 0457 03/21/18 0521 03/22/18 0515  NA 136   < > 136 135 134*  K 3.5   < > 4.2 4.4 4.0  CL 101   < > 97* 99 97*  CO2 24   < > 31 31 30   GLUCOSE 150*   < > 115* 124* 106*  BUN 8   < > 14 13 15   CREATININE 0.86   < > 0.89 0.86 0.97  CALCIUM 8.1*   < > 9.1 8.9 8.8*  PROT 6.5  --   --   --   --   ALBUMIN 3.3*  --   --   --   --   AST 27  --   --   --   --   ALT 16  --   --   --   --   ALKPHOS 56  --   --   --   --   BILITOT 0.8  --   --   --   --  GFRNONAA >60   < > >60 >60 >60  GFRAA >60   < > >60 >60 >60  ANIONGAP 11   < > 8 5 7    < > = values in this interval not displayed.     Hematology Recent Labs  Lab 03/20/18 0457 03/21/18 0521 03/22/18 0515  WBC 5.7 5.6 6.2  RBC 4.12* 4.19* 4.56  HGB 10.5* 10.6* 11.4*  HCT 35.6* 35.5* 39.0  MCV 86.4 84.7 85.5  MCH 25.5* 25.3* 25.0*  MCHC 29.5* 29.9* 29.2*  RDW 20.2* 20.4* 20.3*  PLT 184 197 225    Cardiac Enzymes Recent Labs  Lab 03/16/18 2245  TROPONINI 0.04*   No results for input(s): TROPIPOC in the last 168 hours.   BNP Recent Labs  Lab 03/16/18 2245  BNP 257.0*     DDimer No results for input(s): DDIMER in the last 168 hours.   Radiology    No results found.  Cardiac Studies   TEE (01/17/2018): - Left ventricle: The cavity size was moderately dilated. Systolic function was moderately to severely reduced. The estimated ejection fraction was 30%. Diffuse hypokinesis. - Right ventricle: The cavity size was moderately dilated.  Patient Profile     58 y.o. male with chronic systolic HF who presented with acute on chronic SOB. Some evidence of volume overload. Also, with morbid obesity. He also  has chronic atrial fib.  Assessment & Plan    Acute on chronic systolic heart failure Patient has continued to note improvement in symptoms of SOB/DOE and orthopnea.  He still has 2+ pitting edema in his legs, which may be somewhat chronic / baseline for this patient. + HJR.  Urine output remains sufficient at ~2L daily but only down ~3lbs from admission. He continues to have soft SBP per review of vitals. Currently diuresing on 120mg  IV lasix BID.   Recommend continued monitoring fluid intake also reinforced.  Continue furosemide as patient is still volume overloaded and so low BP does not preclude the administration of diuresis. Continue metoprolol and spironolactone. BP today still hypotensive. Consider changing Entresto to losartan in the short-term and until pressures become stable.  Chronic atrial fibrillation Heart rate well controlled at rest, though spikes with activity continue.  Patient does not believe he has ever undergone cardioversion or had an attempt at rhythm control.  In the setting of his severe systolic heart failure, he may benefit from restoration of sinus rhythm if able to hold SR, though maintenance of this with his comorbidities may be challenging.  Continue metoprolol succinate 50 mg twice daily.  Continue rivaroxaban.  As previously noted, future EP consultation recommended to discuss antiarrhythmic therapy and cardioversion once the patient's decompensated heart failure has resolved and to prevent further worsening of heart failure.  Morbid obesity - Weight loss encouraged.  Obstructive sleep apnea  Continue CPAP use.  For questions or updates, please contact [facility] Please consult [email or web address] for contact info under [facility].    Signed, Lennon Alstrom, PA-C  03/22/2018, 1:02 PM

## 2018-03-23 LAB — BASIC METABOLIC PANEL
Anion gap: 7 (ref 5–15)
BUN: 22 mg/dL — ABNORMAL HIGH (ref 6–20)
CO2: 29 mmol/L (ref 22–32)
Calcium: 8.5 mg/dL — ABNORMAL LOW (ref 8.9–10.3)
Chloride: 96 mmol/L — ABNORMAL LOW (ref 98–111)
Creatinine, Ser: 1.02 mg/dL (ref 0.61–1.24)
GFR calc Af Amer: >60 mL/min (ref >60)
Glucose, Bld: 87 mg/dL (ref 70–99)
Potassium: 3.9 mmol/L (ref 3.5–5.1)
Sodium: 132 mmol/L — ABNORMAL LOW (ref 135–145)

## 2018-03-23 MED ORDER — SODIUM CHLORIDE 0.9% FLUSH: 3.0000 mL | INTRAVENOUS | Status: DC | PRN

## 2018-03-23 MED ORDER — SODIUM CHLORIDE 0.9 % IV SOLN: 250.0000 mL | INTRAVENOUS | Status: DC

## 2018-03-23 MED ORDER — SODIUM CHLORIDE 0.9% FLUSH
3.0000 mL | Freq: Two times a day (BID) | INTRAVENOUS | Status: DC
  Administered 2018-03-23 – 2018-03-24 (×3): 3 mL via INTRAVENOUS

## 2018-03-23 NOTE — Consult Note (Signed)
ELECTROPHYSIOLOGY CONSULT NOTE  Patient ID: [name] [name], MRN: [id number], DOB/AGE: 17-May-1960 58 y.o. Admit date: 03/16/2018 Date of Consult: 03/23/2018  Primary Physician: Patient, No Pcp Per Primary Cardiologist: CE Mindy Verzosa is a 58 y.o. male who is being seen today for the evaluation of AFib at the request of CE.   Chief Complaint: Afib and CHF   HPI [name] [name] is a 59 y.o. male with a history of persistent atrial fibrillation and congestive heart failure we are asked to see for consideration of antiarrhythmic therapy  He has a history of atrial fibrillation that dates back a couple of years.  No prior cardioversion.  Discussion previously of a pacemaker.  Attributable symptoms are not specific; sometimes he has more dyspnea on exertion and others and it is his impression that the latter is associated with "worse atrial fibrillation "  DATE TEST EF   2018  [facility]  No CAD ( per pt report)   12/19 Echo   30 % RVE and LVE        Multiple hospitalizations over the last 2 months secondary to respiratory failure.  Ascribed to COPD but also noted to be in atrial fibrillation with left bundle branch block.  Initial event was associated with medication noncompliance following moving here from [place],.   Seen on the occasion of his fourth hospitalization by cardiology.  Aggressive diuresis undertaken.  That 10 L negative on this hospitalization (albeit with no weight change) noted to continue to be in persistent atrial fibrillation.  BNP is elevated.  Volume status hard to assess because of morbid obesity.  Long-acting beta blockers and Aldactone initiated on this hospitalization.  Morbid obesity and untreated sleep apnea  Thromboembolic risk factors (  CHF-1) for a CHADSVASc Score of 1           Past Medical History:  Diagnosis Date  . A-fib (HCC)   . Asthma   . CHF (congestive heart failure) (HCC)   . COPD (chronic obstructive pulmonary disease) (HCC)   .  Sleep apnea       Surgical History:  Past Surgical History:  Procedure Laterality Date  . hearnia repair    . LEG SURGERY       Home Meds: Prior to Admission medications   Medication Sig Start Date End Date Taking? Authorizing Provider  albuterol (PROVENTIL HFA;VENTOLIN HFA) 108 (90 Base) MCG/ACT inhaler Inhale 2 puffs into the lungs every 6 (six) hours as needed for wheezing or shortness of breath. 01/29/18  Yes Milagros Loll, MD  aspirin 325 MG tablet Take 325 mg by mouth daily.   Yes [provider]  metoprolol tartrate (LOPRESSOR) 50 MG tablet Take 2 tablets (100 mg total) by mouth 2 (two) times daily. 01/19/18  Yes Auburn Bilberry, MD  nitroGLYCERIN (NITROSTAT) 0.4 MG SL tablet Place 1 tablet (0.4 mg total) under the tongue every 5 (five) minutes as needed for chest pain. 01/18/18  Yes Auburn Bilberry, MD  potassium chloride (K-DUR) 10 MEQ tablet Take 1 tablet (10 mEq total) by mouth 2 (two) times daily. 02/27/18  Yes Sudini, Wardell Heath, MD  rivaroxaban (XARELTO) 20 MG TABS tablet Take 1 tablet (20 mg total) by mouth daily. 01/18/18  Yes Auburn Bilberry, MD  sacubitril-valsartan (ENTRESTO) 24-26 MG Take 1 tablet by mouth 2 (two) times daily. D/C lisinopril 02/02/18  Yes Clarisa Kindred A, FNP  torsemide (DEMADEX) 20 MG tablet Take 3 tablets (60 mg total) by mouth 2 (two) times daily. 02/27/18  Yes Sudini,  Srikar, MD  traZODone (DESYREL) 150 MG tablet Take 1 tablet (150 mg total) by mouth at bedtime as needed for sleep. 01/18/18  Yes Auburn Bilberry, MD    Inpatient Medications:  . cephALEXin  500 mg Oral TID AC & HS  . mouth rinse  15 mL Mouth Rinse BID  . metoprolol succinate  50 mg Oral BID  . potassium chloride  20 mEq Oral BID  . rivaroxaban  20 mg Oral Q supper  . sacubitril-valsartan  1 tablet Oral BID  . spironolactone  25 mg Oral Daily    Allergies: No Known Allergies  Social History   Socioeconomic History  . Marital status: Single    Spouse name: Not on file  .  Number of children: Not on file  . Years of education: Not on file  . Highest education level: Not on file  Occupational History  . Not on file  Social Needs  . Financial resource strain: Not on file  . Food insecurity:    Worry: Not on file    Inability: Not on file  . Transportation needs:    Medical: Not on file    Non-medical: Not on file  Tobacco Use  . Smoking status: Never Smoker  . Smokeless tobacco: Never Used  Substance and Sexual Activity  . Alcohol use: Never    Frequency: Never  . Drug use: Never  . Sexual activity: Not on file  Lifestyle  . Physical activity:    Days per week: Not on file    Minutes per session: Not on file  . Stress: Not on file  Relationships  . Social connections:    Talks on phone: Not on file    Gets together: Not on file    Attends religious service: Not on file    Active member of club or organization: Not on file    Attends meetings of clubs or organizations: Not on file    Relationship status: Not on file  . Intimate partner violence:    Fear of current or ex partner: Not on file    Emotionally abused: Not on file    Physically abused: Not on file    Forced sexual activity: Not on file  Other Topics Concern  . Not on file  Social History Narrative  . Not on file     Family History  Problem Relation Age of Onset  . Heart failure Mother   . Lung cancer Mother   . Lung cancer Father      ROS:  Please see the history of present illness.     All other systems reviewed and negative.    Physical Exam:  Blood pressure (!) 100/51, pulse 74, temperature (!) 97.4 F (36.3 C), temperature source Oral, resp. rate 16, height 5\' 8"  (1.727 m), weight (!) 174.1 kg, SpO2 98 %. General: Well developed, Morbidly obese  male in no acute distress. Head: Normocephalic, atraumatic, sclera non-icteric, no xanthomas, nares are without discharge. EENT: normal Lymph Nodes:  none Back: without scoliosis/kyphosis , no CVA tendersness Neck:  Negative for carotid bruits. JVD unable to discern Lungs: Clear bilaterally to auscultation without wheezes, rales, or rhonchi. Breathing is unlabored. Heart: Irregularly irregular rate and rhythm no murmur , rubs, or gallops appreciated. Abdomen: Soft, non-tender, non-distended with normoactive bowel sounds. No hepatomegaly. No rebound/guarding. No obvious abdominal masses. Msk:  Strength and tone appear normal for age. Extremities: No clubbing or cyanosis. 2-3+ edema.  Distal pedal pulses are 2+ and equal  bilaterally. Skin: Warm and Dry Neuro: Alert and oriented X 3. CN III-XII intact Grossly normal sensory and motor function . Psych:  Responds to questions appropriately with a normal affect.      Labs: Cardiac Enzymes No results for input(s): CKTOTAL, CKMB, TROPONINI in the last 72 hours. CBC Lab Results  Component Value Date   WBC 6.2 03/22/2018   HGB 11.4 (L) 03/22/2018   HCT 39.0 03/22/2018   MCV 85.5 03/22/2018   PLT 225 03/22/2018   PROTIME: No results for input(s): LABPROT, INR in the last 72 hours. Chemistry  Recent Labs  Lab 03/16/18 2245  03/23/18 0429  NA 136   < > 132*  K 3.5   < > 3.9  CL 101   < > 96*  CO2 24   < > 29  BUN 8   < > 22*  CREATININE 0.86   < > 1.02  CALCIUM 8.1*   < > 8.5*  PROT 6.5  --   --   BILITOT 0.8  --   --   ALKPHOS 56  --   --   ALT 16  --   --   AST 27  --   --   GLUCOSE 150*   < > 87   < > = values in this interval not displayed.   Lipids Lab Results  Component Value Date   CHOL 185 01/28/2018   HDL 81 01/28/2018   LDLCALC 94 01/28/2018   TRIG 48 01/28/2018   BNP No results found for: PROBNP Thyroid Function Tests: No results for input(s): TSH, T4TOTAL, T3FREE, THYROIDAB in the last 72 hours.  Invalid input(s): FREET3    Miscellaneous No results found for: DDIMER  Radiology/Studies:  Dg Chest 1 View  Result Date: 02/23/2018 CLINICAL DATA:  Left-sided chest pain and shortness of Breath EXAM: CHEST  1 VIEW  COMPARISON:  01/27/2018 FINDINGS: Cardiac shadow is mildly enlarged but stable. The lungs are well aerated bilaterally. No focal infiltrate or sizable effusion is seen. No acute bony abnormality is noted. IMPRESSION: No active disease. Electronically Signed   By: Alcide CleverMark  Lukens M.D.   On: 02/23/2018 17:46   Dg Chest 2 View  Result Date: 03/18/2018 CLINICAL DATA:  CHF. EXAM: CHEST - 2 VIEW COMPARISON:  Chest x-ray dated March 16, 2018. FINDINGS: Stable mild cardiomegaly and pulmonary vascular congestion. No focal consolidation, pleural effusion, or pneumothorax. No acute osseous abnormality. IMPRESSION: 1. Stable pulmonary vascular congestion without overt edema. Electronically Signed   By: Obie DredgeWilliam T Derry M.D.   On: 03/18/2018 08:38   Dg Chest Portable 1 View  Result Date: 03/16/2018 CLINICAL DATA:  Chest pain, nausea, vomiting EXAM: PORTABLE CHEST 1 VIEW COMPARISON:  02/24/2018 FINDINGS: Cardiomegaly with vascular congestion. No overt edema. No confluent opacity or effusion. No acute bony abnormality. IMPRESSION: Cardiomegaly with vascular congestion. Electronically Signed   By: Charlett NoseKevin  Dover M.D.   On: 03/16/2018 23:03   Dg Chest Port 1 View  Result Date: 02/24/2018 CLINICAL DATA:  Acute respiratory failure. EXAM: PORTABLE CHEST 1 VIEW COMPARISON:  Radiograph of February 23, 2018. FINDINGS: Stable cardiomegaly. No pneumothorax is noted. No significant pleural effusion is noted. No consolidative process is noted. Bony thorax is unremarkable. IMPRESSION: No acute cardiopulmonary abnormality seen. Electronically Signed   By: Lupita RaiderJames  Green Jr, M.D.   On: 02/24/2018 07:14   Telemetry Personally reviewed  Atrial fib with improved HR control over the last 48 hrs with average about 60-70 without sign brady or tachy  EKG:  AFib   Assessment and Plan:  Atrial fibrillation persistent  CHF acute chronic systolic  EF 30% \ Left bundle branch block  COPD  Morbid obesity-hypoventilation syndrome  Sepsis  2/2 cellulitis    Agree with ongoing aggressive IV diuresis.  The discrepancy between I's and O's and weight is disconcerting.  Agree also with consideration of cardioversion.  At this juncture would undertake without support of antiarrhythmic therapy would do so sooner rather than later  The duration of his atrial fibrillation is unclear.  He has been told he has had atrial fibrillation for for 5 years; however, his symptoms are variable suggesting that he may have paroxysms of atrial fibrillation as opposed to long-term persistent.  His left atrial dimension does not support long term persistent atrial fibrillation but.  He has nonischemic cardiomyopathy left bundle branch block.  He is also a candidate for resynchronization if his left ventricular function does not improve following the introduction of guideline directed medical therapy and is being maintained over the next 3 months or so.  Given his history of recurrent atrial fibrillation, I would agree with antiarrhythmic support but would wait for resolution of his congestive heart failure.  Given his young age I would favor the use of dofetilide.  Indeed, I might either wait for recurrence of atrial arrhythmia prior to its initiation  He is still volume overloaded-significantly.  Given the fact that he has had 4 hospitalizations in the last 6 weeks with recurrent heart failure I would recommend in-hospital aggressive diuresis until he is euvolemic.  Teaching on salt and restriction of fluid is going to be important thing.    Sherryl Manges

## 2018-03-23 NOTE — Plan of Care (Signed)
  Problem: Activity: Goal: Risk for activity intolerance will decrease Outcome: Progressing Note:  Up in room independently    Problem: Coping: Goal: Level of anxiety will decrease Outcome: Progressing   Problem: Safety: Goal: Ability to remain free from injury will improve Outcome: Progressing   Problem: Skin Integrity: Goal: Risk for impaired skin integrity will decrease Outcome: Progressing   Problem: Cardiac: Goal: Ability to achieve and maintain adequate cardiopulmonary perfusion will improve Outcome: Progressing Note:  Still receiving IV lasix   Problem: Pain Managment: Goal: General experience of comfort will improve Outcome: Not Progressing Note:  Complaints of chronic generalized pain received IV morphine for once with relief

## 2018-03-23 NOTE — Progress Notes (Signed)
The patient was seen by Dr. Graciela Husbands today who recommended cardioversion to be done tomorrow.  The procedure is scheduled for tomorrow.

## 2018-03-23 NOTE — Progress Notes (Signed)
[employer] [facility]   PATIENT NAME: [name] [name]    MR#:  [id number]  DATE OF BIRTH:  1960/11/11  SUBJECTIVE:  CHIEF COMPLAINT:   Chief Complaint  Patient presents with  . Chest Pain  shortness of breath and leg swelling + , slowly improving, sitting in chair, 10 liter neg REVIEW OF SYSTEMS:  Review of Systems  Constitutional: Positive for malaise/fatigue. Negative for chills and fever.  HENT: Negative for sore throat.   Eyes: Negative for blurred vision and double vision.  Respiratory: Positive for shortness of breath. Negative for cough, hemoptysis, wheezing and stridor.   Cardiovascular: Positive for leg swelling. Negative for chest pain, palpitations and orthopnea.  Gastrointestinal: Negative for abdominal pain, blood in stool, diarrhea, melena, nausea and vomiting.  Genitourinary: Negative for dysuria, flank pain and hematuria.  Musculoskeletal: Negative for back pain and joint pain.       Left leg pain, swelling and ulcer.  Skin: Negative for rash.  Neurological: Negative for dizziness, sensory change, focal weakness, seizures, loss of consciousness, weakness and headaches.  Endo/Heme/Allergies: Negative for polydipsia.  Psychiatric/Behavioral: Negative for depression. The patient is not nervous/anxious.    DRUG ALLERGIES:  No Known Allergies VITALS:  Blood pressure (!) 100/52, pulse 74, temperature (!) 97.4 F (36.3 C), temperature source Oral, resp. rate 16, height 5\' 8"  (1.727 m), weight (!) 174.1 kg, SpO2 98 %. PHYSICAL EXAMINATION:  Physical Exam Constitutional:      General: He is not in acute distress.    Comments: Morbid obesity  HENT:     Head: Normocephalic.     Mouth/Throat:     Mouth: Mucous membranes are moist.  Eyes:     General: No scleral icterus.    Conjunctiva/sclera: Conjunctivae normal.     Pupils: Pupils are equal, round, and reactive to light.  Neck:     Musculoskeletal: Normal range of motion and neck  supple.     Vascular: No JVD.     Trachea: No tracheal deviation.  Cardiovascular:     Rate and Rhythm: Normal rate and regular rhythm.     Heart sounds: Normal heart sounds. No murmur. No gallop.   Pulmonary:     Effort: Pulmonary effort is normal. No respiratory distress.     Breath sounds: Normal breath sounds. No wheezing or rales.  Abdominal:     General: Bowel sounds are normal. There is no distension.     Palpations: Abdomen is soft.     Tenderness: There is no abdominal tenderness. There is no rebound.  Musculoskeletal: Normal range of motion.        General: No tenderness.     Right lower leg: Edema present.     Left lower leg: Edema present.     Comments: Left leg edema, ulcer and erythema.  Chronic skin changes.  Skin:    Findings: No erythema or rash.  Neurological:     General: No focal deficit present.     Mental Status: He is alert and oriented to person, place, and time.     Cranial Nerves: No cranial nerve deficit.  Psychiatric:        Mood and Affect: Mood normal.    LABORATORY PANEL:  Male CBC Recent Labs  Lab 03/22/18 0515  WBC 6.2  HGB 11.4*  HCT 39.0  PLT 225   ------------------------------------------------------------------------------------------------------------------ Chemistries  Recent Labs  Lab 03/16/18 2245  03/20/18 0457  03/23/18 0429  NA 136   < >  136   < > 132*  K 3.5   < > 4.2   < > 3.9  CL 101   < > 97*   < > 96*  CO2 24   < > 31   < > 29  GLUCOSE 150*   < > 115*   < > 87  BUN 8   < > 14   < > 22*  CREATININE 0.86   < > 0.89   < > 1.02  CALCIUM 8.1*   < > 9.1   < > 8.5*  MG  --    < > 1.7  --   --   AST 27  --   --   --   --   ALT 16  --   --   --   --   ALKPHOS 56  --   --   --   --   BILITOT 0.8  --   --   --   --    < > = values in this interval not displayed.   RADIOLOGY:  No results found. ASSESSMENT AND PLAN:  Samiul Acrey  is a 58 y.o. male with a known history of A. fib, asthma, systolic CHF, COPD, sleep  apnea-was recently admitted with CHF.  At home he had worsening swelling on his legs for last few days and he had a big blister on his left leg which busted few days ago and since then there is some redness surrounding there and it is hurting him.  He also complains of getting short of breath with minimal exertion.  *Sepsis: resolved Secondary to cellulitis - Continue Unasyn. - Lactic acidosis due to above.  Improved with treatment.  *Acute respiratory failure with hypoxia, possible due to acute on chronic systolic congestive heart failure and OSA 30% EF as per recent echocardiogram. - Continue IV Lasix to 120 mg BID if BP allows, intake and output monitoring, fluid restriction, daily weight Counseled about fluid restriction and restricted salt intake in diet. - Continue Entresto, metoprolol, and spironolactone - Continue Toprol.  Continue Entresto. also small dose spironolactone. - Neg 10.6 liters  *Atrial fibrillation with RVR, heart rate is better controlled at about 100. Continue Metoprolol and Xarelto, Cardio considering cardioversion tomorrow  *Hyperglycemia due to DM2. Hemoglobin A1c: 6.9.   Hyperglycemia improved.  The patient does not want sliding scale.  *COPD Continue inhalers as home, no exacerbation at this time.  * Sleep apnea   CPAP as home regimen.  Morbid obesity.  Diet control and follow-up PCP.  Hypomagnesemia: replaced   The patient said he has no PCP and cardiologist.  He recently moved to this area from [place].  He will need set up PCP and cardiology appointment on discharge.   All the records are reviewed and case discussed with Care Management/Social Worker. Management plans discussed with the patient, cardio and they are in agreement.  CODE STATUS: Full Code  TOTAL TIME TAKING CARE OF THIS PATIENT: 30 minutes.   More than 50% of the time was spent in counseling/coordination of care: YES  POSSIBLE D/C IN 1-2 DAYS, DEPENDING ON CLINICAL  CONDITION. And Cardio eval   Delfino Lovett M.D on 03/23/2018 at 4:35 PM  Between 7am to 6pm - Pager - [phone]  After 6pm go to [email or web address] - Therapist, nutritional Hospitalists

## 2018-03-24 ENCOUNTER — Inpatient Hospital Stay (HOSPITAL_COMMUNITY)
Admit: 2018-03-24 | Discharge: 2018-03-24 | Disposition: A | Discharge status: Home / Self Care | Payer: Medicaid Other | Attending: Physician Assistant | Admitting: Physician Assistant

## 2018-03-24 ENCOUNTER — Inpatient Hospital Stay: Payer: Medicaid Other | Admitting: Anesthesiology

## 2018-03-24 ENCOUNTER — Encounter: Payer: Self-pay | Admitting: *Deleted

## 2018-03-24 ENCOUNTER — Encounter
Admission: EM | Disposition: A | Discharge status: Home Health | Payer: Self-pay | Source: Home / Self Care | Attending: Internal Medicine

## 2018-03-24 ENCOUNTER — Ambulatory Visit: Payer: Medicaid Other | Admitting: Family

## 2018-03-24 DIAGNOSIS — I5023 Acute on chronic systolic (congestive) heart failure: Secondary | ICD-10-CM

## 2018-03-24 HISTORY — PX: CARDIOVERSION: EP1203

## 2018-03-24 LAB — CBC WITH DIFFERENTIAL/PLATELET
Abs Immature Granulocytes: 0.04 10*3/uL (ref 0.00–0.07)
Basophils Absolute: 0 10*3/uL (ref 0.0–0.1)
Basophils Relative: 1 %
Eosinophils Absolute: 0.3 10*3/uL (ref 0.0–0.5)
Eosinophils Relative: 5 %
HCT: 35.7 % — ABNORMAL LOW (ref 39.0–52.0)
Hemoglobin: 10.7 g/dL — ABNORMAL LOW (ref 13.0–17.0)
Immature Granulocytes: 1 %
Lymphocytes Relative: 21 %
Lymphs Abs: 1.2 10*3/uL (ref 0.7–4.0)
MCH: 25.2 pg — ABNORMAL LOW (ref 26.0–34.0)
MCHC: 30 g/dL (ref 30.0–36.0)
MCV: 84.2 fL (ref 80.0–100.0)
Monocytes Absolute: 1.3 10*3/uL — ABNORMAL HIGH (ref 0.1–1.0)
Monocytes Relative: 23 %
NRBC: 0 % (ref 0.0–0.2)
Neutro Abs: 2.7 10*3/uL (ref 1.7–7.7)
Neutrophils Relative %: 49 %
Platelets: 222 10*3/uL (ref 150–400)
RBC: 4.24 MIL/uL (ref 4.22–5.81)
RDW: 20.1 % — ABNORMAL HIGH (ref 11.5–15.5)
WBC: 5.6 10*3/uL (ref 4.0–10.5)

## 2018-03-24 LAB — ECHOCARDIOGRAM COMPLETE
Height: 68 in
Weight: 6031.79 oz

## 2018-03-24 LAB — BASIC METABOLIC PANEL
Anion gap: 7 (ref 5–15)
BUN: 25 mg/dL — ABNORMAL HIGH (ref 6–20)
CO2: 30 mmol/L (ref 22–32)
Calcium: 8.6 mg/dL — ABNORMAL LOW (ref 8.9–10.3)
Chloride: 98 mmol/L (ref 98–111)
Creatinine, Ser: 1.04 mg/dL (ref 0.61–1.24)
Glucose, Bld: 112 mg/dL — ABNORMAL HIGH (ref 70–99)
Potassium: 4 mmol/L (ref 3.5–5.1)
Sodium: 135 mmol/L (ref 135–145)

## 2018-03-24 SURGERY — CARDIOVERSION (CATH LAB)
Anesthesia: General

## 2018-03-24 MED ORDER — OXYCODONE-ACETAMINOPHEN 5-325 MG PO TABS
1.0000 | ORAL_TABLET | Freq: Four times a day (QID) | ORAL | Status: DC | PRN
  Administered 2018-03-24 – 2018-03-25 (×4): 1 via ORAL
  Filled 2018-03-24 (×4): qty 1

## 2018-03-24 MED ORDER — PROPOFOL 10 MG/ML IV BOLUS
INTRAVENOUS | Status: DC | PRN
  Administered 2018-03-24: 70 mg via INTRAVENOUS

## 2018-03-24 MED ORDER — PERFLUTREN LIPID MICROSPHERE
1.0000 mL | INTRAVENOUS | Status: AC | PRN
  Administered 2018-03-24: 3 mL via INTRAVENOUS
  Filled 2018-03-24: qty 10

## 2018-03-24 MED ORDER — EPHEDRINE SULFATE 50 MG/ML IJ SOLN
INTRAMUSCULAR | Status: AC
  Filled 2018-03-24: qty 1

## 2018-03-24 MED ORDER — TORSEMIDE 20 MG PO TABS
100.0000 mg | ORAL_TABLET | Freq: Every day | ORAL | Status: DC
  Administered 2018-03-24 – 2018-03-25 (×2): 100 mg via ORAL
  Filled 2018-03-24 (×2): qty 5

## 2018-03-24 NOTE — Progress Notes (Signed)
Progress Note  Patient Name: [name] MajesticKenneth Doody Date of Encounter: 03/24/2018  Primary Cardiologist: New ([facility]  Subjective   Patient feels better today with less swelling and shortness of breath.  Still vague chest discomfort that has been longstanding (patient reports 2 negative caths in the past).  Inpatient Medications    Scheduled Meds: . [MAR Hold] cephALEXin  500 mg Oral TID AC & HS  . [MAR Hold] mouth rinse  15 mL Mouth Rinse BID  . [MAR Hold] metoprolol succinate  50 mg Oral BID  . [MAR Hold] potassium chloride  20 mEq Oral BID  . [MAR Hold] rivaroxaban  20 mg Oral Q supper  . [MAR Hold] sacubitril-valsartan  1 tablet Oral BID  . [MAR Hold] sodium chloride flush  3 mL Intravenous Q12H  . [MAR Hold] spironolactone  25 mg Oral Daily   Continuous Infusions: . [MAR Hold] sodium chloride 0 mL/hr at 03/21/18 0347  . sodium chloride    . [MAR Hold] furosemide 120 mg (03/23/18 1815)   PRN Meds: [ZOX[MAR Hold] sodium chloride, [MAR Hold] acetaminophen, [MAR Hold] albuterol, [MAR Hold] bisacodyl, [MAR Hold] nitroGLYCERIN, [MAR Hold] ondansetron (ZOFRAN) IV, [MAR Hold] senna, [MAR Hold] sodium chloride flush, [MAR Hold] traZODone   Vital Signs    Vitals:   03/24/18 0745 03/24/18 0855 03/24/18 0916 03/24/18 0920  BP: 105/68  130/78 130/73  Pulse: 61 87 83 81  Resp: 20 19 20 16   Temp: 98.7 F (37.1 C) 99 F (37.2 C)    TempSrc: Oral Oral    SpO2: 98% 99% 99% 100%  Weight:  (!) 171 kg    Height:  5\' 8"  (1.727 m)      Intake/Output Summary (Last 24 hours) at 03/24/2018 0926 Last data filed at 03/24/2018 0300 Gross per 24 hour  Intake 1401.91 ml  Output 4425 ml  Net -3023.09 ml   Last 3 Weights 03/24/2018 03/24/2018 03/23/2018  Weight (lbs) 376 lb 15.8 oz 377 lb 4.8 oz 383 lb 12.8 oz  Weight (kg) 171 kg 171.142 kg 174.091 kg      Telemetry    Atrial fibrillation - Personally Reviewed  ECG    Post cardioversion EKG shows normal sinus rhythm with first-degree  AV block and left bundle branch block - Personally Reviewed  Physical Exam   GEN: No acute distress.   Neck:  Difficult to assess due to body habitus. Cardiac: RRR, no murmurs, rubs, or gallops.  Respiratory: Clear to auscultation bilaterally. GI: Soft, nontender, non-distended  MS:  Trace to 1+ chronic lower extremity edema; No deformity. Neuro:  Nonfocal  Psych: Normal affect   Labs    Chemistry Recent Labs  Lab 03/22/18 0515 03/23/18 0429 03/24/18 0558  NA 134* 132* 135  K 4.0 3.9 4.0  CL 97* 96* 98  CO2 30 29 30   GLUCOSE 106* 87 112*  BUN 15 22* 25*  CREATININE 0.97 1.02 1.04  CALCIUM 8.8* 8.5* 8.6*  GFRNONAA >60 >60 >60  GFRAA >60 >60 >60  ANIONGAP 7 7 7      Hematology Recent Labs  Lab 03/21/18 0521 03/22/18 0515 03/24/18 0558  WBC 5.6 6.2 5.6  RBC 4.19* 4.56 4.24  HGB 10.6* 11.4* 10.7*  HCT 35.5* 39.0 35.7*  MCV 84.7 85.5 84.2  MCH 25.3* 25.0* 25.2*  MCHC 29.9* 29.2* 30.0  RDW 20.4* 20.3* 20.1*  PLT 197 225 222    Cardiac EnzymesNo results for input(s): TROPONINI in the last 168 hours. No results for input(s):  TROPIPOC in the last 168 hours.   BNPNo results for input(s): BNP, PROBNP in the last 168 hours.   DDimer No results for input(s): DDIMER in the last 168 hours.   Radiology    No results found.  Cardiac Studies   Echocardiogram (01/17/18): - Left ventricle: The cavity size was moderately dilated. Systolic   function was moderately to severely reduced. The estimated   ejection fraction was 30%. Diffuse hypokinesis. - Right ventricle: The cavity size was moderately dilated.  Patient Profile     58 y.o. male  with chronic systolic HF who presented with acute on chronic SOB. Some evidence of volume overload. Also, with morbid obesity. He also has chronic atrial fib.  Assessment & Plan    Acute on chronic systolic heart failure Volume status and heart failure symptoms improving with diuresis.  I suspect he is still retaining some  fluid, though I think it is reasonable to transition to an oral regimen in anticipation of discharge.  He is tolerating current evidence-based heart failure regimen well.  Reported history of 2 negative caths in the past.  Discontinue furosemide 120 mg IV twice daily and start torsemide 100 mg p.o. daily.  Continue current doses of metoprolol succinate, Entresto, and spironolactone.  Further escalation as an outpatient will be pursued, as heart rate and blood pressure allow.  Would be helpful to have outside records regarding prior ischemia evaluation.  Repeat echocardiogram now that sinus rhythm has been restored prior to discharge.  Sodium and fluid restriction.  Persistent atrial fibrillation Successful cardioversion today with restoration of sinus rhythm.  Per Dr. Graciela Husbands, no indication for antiarrhythmic therapy at this point.  Continue metoprolol succinate 50 mg twice daily.  Continue rivaroxaban 20 mg daily.  Obesity and obstructive sleep apnea  Continued CPAP use and weight loss encouraged.  Disposition Based on urine output with torsemide and dyspnea of breath, patient may be ready for discharge home tomorrow.  For questions or updates, please contact [facility] Please consult [email or web address] for contact info under [facility].     Signed, [name], MD  03/24/2018, 9:26 AM

## 2018-03-24 NOTE — Progress Notes (Signed)
Made two attempts to speak with Telemetry nurse for report but unable to reach. Placed request to get patient down to [facility]  due to Cardiologist and Anesthesia needs to start case at 9:00 AM.

## 2018-03-24 NOTE — Anesthesia Postprocedure Evaluation (Signed)
Anesthesia Post Note  Patient: [name] [name]  Procedure(s) Performed: CARDIOVERSION (N/A )  Patient location during evaluation: Other (specials recovery) Anesthesia Type: General Level of consciousness: awake and alert and oriented Pain management: pain level controlled Vital Signs Assessment: post-procedure vital signs reviewed and stable Respiratory status: spontaneous breathing, nonlabored ventilation and respiratory function stable Cardiovascular status: blood pressure returned to baseline and stable Postop Assessment: no signs of nausea or vomiting Anesthetic complications: no     Last Vitals:  Vitals:   03/24/18 0945 03/24/18 1005  BP: 140/90 109/68  Pulse: 81 83  Resp: 16   Temp:    SpO2: 100% 100%    Last Pain:  Vitals:   03/24/18 0945  TempSrc:   PainSc: 0-No pain                 [name]

## 2018-03-24 NOTE — Anesthesia Preprocedure Evaluation (Signed)
Anesthesia Evaluation  Patient identified by MRN, date of birth, ID band Patient awake    Reviewed: Allergy & Precautions, NPO status , Patient's Chart, lab work & pertinent test results  History of Anesthesia Complications Negative for: history of anesthetic complications  Airway Mallampati: III  TM Distance: >3 FB Neck ROM: Full    Dental  (+) Poor Dentition   Pulmonary asthma , sleep apnea and Continuous Positive Airway Pressure Ventilation , COPD,  COPD inhaler,    breath sounds clear to auscultation- rhonchi (-) wheezing      Cardiovascular +CHF  (-) CAD, (-) Past MI, (-) Cardiac Stents and (-) CABG + dysrhythmias Atrial Fibrillation  Rhythm:Irregular Rate:Tachycardia - Systolic murmurs and - Diastolic murmurs Echo 01/17/18: - Left ventricle: The cavity size was moderately dilated. Systolic   function was moderately to severely reduced. The estimated   ejection fraction was 30%. Diffuse hypokinesis. - Right ventricle: The cavity size was moderately dilated.    Neuro/Psych neg Seizures negative neurological ROS  negative psych ROS   GI/Hepatic negative GI ROS, Neg liver ROS,   Endo/Other  negative endocrine ROSneg diabetes  Renal/GU negative Renal ROS     Musculoskeletal negative musculoskeletal ROS (+)   Abdominal (+) + obese,   Peds  Hematology negative hematology ROS (+)   Anesthesia Other Findings Past Medical History: No date: A-fib (HCC) No date: Asthma No date: CHF (congestive heart failure) (HCC) No date: COPD (chronic obstructive pulmonary disease) (HCC) No date: Sleep apnea   Reproductive/Obstetrics                             Anesthesia Physical Anesthesia Plan  ASA: III  Anesthesia Plan: General   Post-op Pain Management:    Induction: Intravenous  PONV Risk Score and Plan: 1 and Propofol infusion  Airway Management Planned: Natural Airway  Additional  Equipment:   Intra-op Plan:   Post-operative Plan:   Informed Consent: I have reviewed the patients History and Physical, chart, labs and discussed the procedure including the risks, benefits and alternatives for the proposed anesthesia with the patient or authorized representative who has indicated his/her understanding and acceptance.     Dental advisory given  Plan Discussed with: CRNA and Anesthesiologist  Anesthesia Plan Comments:         Anesthesia Quick Evaluation

## 2018-03-24 NOTE — Progress Notes (Signed)
[employer] - [place] [facility]   PATIENT NAME: [name] [name]    MR#:  [id number]  DATE OF BIRTH:  04-Mar-1960  SUBJECTIVE:  CHIEF COMPLAINT:   Chief Complaint  Patient presents with  . Chest Pain   Patient had a his cardioversion earlier, has chronic pain on the side of his abdomen   REVIEW OF SYSTEMS:  Review of Systems  Constitutional: Positive for malaise/fatigue. Negative for chills and fever.  HENT: Negative for sore throat.   Eyes: Negative for blurred vision and double vision.  Respiratory: Positive for shortness of breath. Negative for cough, hemoptysis, wheezing and stridor.   Cardiovascular: Positive for leg swelling. Negative for chest pain, palpitations and orthopnea.  Gastrointestinal: Negative for abdominal pain, blood in stool, diarrhea, melena, nausea and vomiting.  Genitourinary: Negative for dysuria, flank pain and hematuria.  Musculoskeletal: Negative for back pain and joint pain.       Left leg pain, swelling and ulcer.  Skin: Negative for rash.  Neurological: Negative for dizziness, sensory change, focal weakness, seizures, loss of consciousness, weakness and headaches.  Endo/Heme/Allergies: Negative for polydipsia.  Psychiatric/Behavioral: Negative for depression. The patient is not nervous/anxious.    DRUG ALLERGIES:  No Known Allergies VITALS:  Blood pressure 109/68, pulse 83, temperature 99 F (37.2 C), temperature source Oral, resp. rate 16, height 5\' 8"  (1.727 m), weight (!) 171 kg, SpO2 100 %. PHYSICAL EXAMINATION:  Physical Exam Constitutional:      General: He is not in acute distress.    Comments: Morbid obesity  HENT:     Head: Normocephalic.     Mouth/Throat:     Mouth: Mucous membranes are moist.  Eyes:     General: No scleral icterus.    Conjunctiva/sclera: Conjunctivae normal.     Pupils: Pupils are equal, round, and reactive to light.  Neck:     Musculoskeletal: Normal range of motion and neck supple.       Vascular: No JVD.     Trachea: No tracheal deviation.  Cardiovascular:     Rate and Rhythm: Normal rate and regular rhythm.     Heart sounds: Normal heart sounds. No murmur. No gallop.   Pulmonary:     Effort: Pulmonary effort is normal. No respiratory distress.     Breath sounds: Normal breath sounds. No wheezing or rales.  Abdominal:     General: Bowel sounds are normal. There is no distension.     Palpations: Abdomen is soft.     Tenderness: There is no abdominal tenderness. There is no rebound.  Musculoskeletal: Normal range of motion.        General: No tenderness.     Right lower leg: Edema present.     Left lower leg: Edema present.     Comments: Left leg edema, ulcer and erythema.  Chronic skin changes.  Skin:    Findings: No erythema or rash.  Neurological:     General: No focal deficit present.     Mental Status: He is alert and oriented to person, place, and time.     Cranial Nerves: No cranial nerve deficit.  Psychiatric:        Mood and Affect: Mood normal.    LABORATORY PANEL:  Male CBC Recent Labs  Lab 03/24/18 0558  WBC 5.6  HGB 10.7*  HCT 35.7*  PLT 222   ------------------------------------------------------------------------------------------------------------------ Chemistries  Recent Labs  Lab 03/20/18 0457  03/24/18 0558  NA 136   < > 135  K 4.2   < > 4.0  CL 97*   < > 98  CO2 31   < > 30  GLUCOSE 115*   < > 112*  BUN 14   < > 25*  CREATININE 0.89   < > 1.04  CALCIUM 9.1   < > 8.6*  MG 1.7  --   --    < > = values in this interval not displayed.   RADIOLOGY:  No results found. ASSESSMENT AND PLAN:  Gehrig Schiess  is a 58 y.o. male with a known history of A. fib, asthma, systolic CHF, COPD, sleep apnea-was recently admitted with CHF.  At home he had worsening swelling on his legs for last few days and he had a big blister on his left leg which busted few days ago and since then there is some redness surrounding there and it is  hurting him.  He also complains of getting short of breath with minimal exertion.  *Sepsis: resolved Secondary to cellulitis -Patient was on oral Keflex which was discontinued he has had 9 days of antibiotic   *Acute respiratory failure with hypoxia, possible due to acute on chronic systolic congestive heart failure and OSA 30% EF as per recent echocardiogram. -Change to oral regimen today Counseled about fluid restriction and restricted salt intake in diet. - Continue Entresto, metoprolol, and spironolactone - Continue Toprol.  Continue Entresto. also small dose spironolactone.   *Atrial fibrillation with RVR, Underwent cardioversion earlier today Continue Xarelto and metoprolol   *Hyperglycemia due to DM2. Hemoglobin A1c: 6.9.   Hyperglycemia improved.  The patient does not want sliding scale.  *COPD Continue inhalers as home, no exacerbation at this time.  * Sleep apnea   CPAP as home regimen.  *Morbid obesity.  Diet control and follow-up PCP.  *Hypomagnesemia: replaced   All the records are reviewed and case discussed with Care Management/Social Worker. Management plans discussed with the patient, cardio and they are in agreement.  CODE STATUS: Full Code  TOTAL TIME TAKING CARE OF THIS PATIENT: 30 minutes.   More than 50% of the time was spent in counseling/coordination of care: YES  POSSIBLE D/C IN 1-2 DAYS, DEPENDING ON CLINICAL CONDITION. And Cardio eval   Auburn Bilberry M.D on 03/24/2018 at 2:36 PM  Between 7am to 6pm - Pager - [phone]  After 6pm go to [email or web address] - Therapist, nutritional Hospitalists

## 2018-03-24 NOTE — CV Procedure (Signed)
    Cardioversion Note  [name] [name] [id number] 10/12/1960  Procedure: DC Cardioversion Indications: Persistent atrial fibrillation  Procedure Details Consent: Obtained Time Out: Verified patient identification, verified procedure, site/side was marked, verified correct patient position, special equipment/implants available, Radiology Safety Procedures followed,  medications/allergies/relevent history reviewed, required imaging and test results available.  Performed  The patient has been on adequate anticoagulation.  The patient received IV propofol by anesthesia for sedation.  Synchronous cardioversion was performed at 200 joules x 1.  The cardioversion was successful with restoration of sinus rhythm.  Complications: No apparent complications Patient did tolerate procedure well.  [name] [name]., MD 03/24/2018, 9:22 AM

## 2018-03-24 NOTE — Plan of Care (Signed)
  Problem: Health Behavior/Discharge Planning: Goal: Ability to manage health-related needs will improve Outcome: Progressing   Problem: Clinical Measurements: Goal: Ability to maintain clinical measurements within normal limits will improve Outcome: Progressing Goal: Will remain free from infection Outcome: Progressing Goal: Diagnostic test results will improve Outcome: Progressing Goal: Respiratory complications will improve Outcome: Progressing Goal: Cardiovascular complication will be avoided Outcome: Progressing   Problem: Activity: Goal: Risk for activity intolerance will decrease Outcome: Progressing   Problem: Coping: Goal: Level of anxiety will decrease Outcome: Progressing   Problem: Elimination: Goal: Will not experience complications related to bowel motility Outcome: Progressing Goal: Will not experience complications related to urinary retention Outcome: Progressing   Problem: Pain Managment: Goal: General experience of comfort will improve Outcome: Progressing   Problem: Safety: Goal: Ability to remain free from injury will improve Outcome: Progressing   Problem: Skin Integrity: Goal: Risk for impaired skin integrity will decrease Outcome: Progressing   Problem: Fluid Volume: Goal: Hemodynamic stability will improve Outcome: Progressing   Problem: Clinical Measurements: Goal: Diagnostic test results will improve Outcome: Progressing Goal: Signs and symptoms of infection will decrease Outcome: Progressing   Problem: Respiratory: Goal: Ability to maintain adequate ventilation will improve Outcome: Progressing   Problem: Spiritual Needs Goal: Ability to function at adequate level Outcome: Progressing   Problem: Education: Goal: Ability to demonstrate management of disease process will improve Outcome: Progressing Goal: Ability to verbalize understanding of medication therapies will improve Outcome: Progressing Goal: Individualized  Educational Video(s) Outcome: Progressing   Problem: Activity: Goal: Capacity to carry out activities will improve Outcome: Progressing   Problem: Cardiac: Goal: Ability to achieve and maintain adequate cardiopulmonary perfusion will improve Outcome: Progressing

## 2018-03-24 NOTE — Anesthesia Post-op Follow-up Note (Signed)
Anesthesia QCDR form completed.        

## 2018-03-24 NOTE — Interval H&P Note (Signed)
History and Physical Interval Note:  03/24/2018 9:11 AM  [name] [name]  has presented today for cardioversion, with the diagnosis of Atrial Fibrillation.The various methods of treatment have been discussed with the patient and family. After consideration of risks, benefits and other options for treatment, the patient has consented to  Procedure(s): CARDIOVERSION (N/A) as a surgical intervention .  The patient's history has been reviewed, patient examined, no change in status, stable for surgery.  I have reviewed the patient's chart and labs.  Questions were answered to the patient's satisfaction.     [name]

## 2018-03-24 NOTE — Progress Notes (Signed)
*  PRELIMINARY RESULTS* Echocardiogram 2D Echocardiogram has been performed.  [name] [name] Eldena [name] 03/24/2018, 10:51 AM

## 2018-03-24 NOTE — Transfer of Care (Signed)
Immediate Anesthesia Transfer of Care Note  Patient: [name] [name]  Procedure(s) Performed: CARDIOVERSION (N/A )  Patient Location: PACU  Anesthesia Type:General  Level of Consciousness: awake, alert  and oriented  Airway & Oxygen Therapy: Patient Spontanous Breathing and Patient connected to nasal cannula oxygen  Post-op Assessment: Report given to RN and Post -op Vital signs reviewed and stable  Post vital signs: Reviewed and stable  Last Vitals:  Vitals Value Taken Time  BP 130/78 03/24/2018  9:16 AM  Temp    Pulse 83 03/24/2018  9:16 AM  Resp 20 03/24/2018  9:16 AM  SpO2 99 % 03/24/2018  9:16 AM    Last Pain:  Vitals:   03/24/18 0916  TempSrc:   PainSc: 0-No pain      Patients Stated Pain Goal: 4 (03/23/18 0102)  Complications: No apparent anesthesia complications

## 2018-03-25 MED ORDER — POTASSIUM CHLORIDE CRYS ER 20 MEQ PO TBCR: 20.0000 meq | EXTENDED_RELEASE_TABLET | Freq: Two times a day (BID) | ORAL | 0 refills | Status: DC

## 2018-03-25 MED ORDER — METOPROLOL SUCCINATE ER 50 MG PO TB24: 50.0000 mg | ORAL_TABLET | Freq: Two times a day (BID) | ORAL | 0 refills | Status: DC

## 2018-03-25 MED ORDER — TORSEMIDE 100 MG PO TABS: 100.0000 mg | ORAL_TABLET | Freq: Every day | ORAL | 0 refills | Status: DC

## 2018-03-25 MED ORDER — SPIRONOLACTONE 25 MG PO TABS: 25.0000 mg | ORAL_TABLET | Freq: Every day | ORAL | 0 refills | Status: DC

## 2018-03-25 MED ORDER — OXYCODONE-ACETAMINOPHEN 5-325 MG PO TABS: 1.0000 | ORAL_TABLET | Freq: Four times a day (QID) | ORAL | 0 refills | Status: DC | PRN

## 2018-03-25 NOTE — Care Management Note (Signed)
Case Management Note  Patient Details  Name: Keegan Schneider MRN: [id number] Date of Birth: [birth year]  Subjective/Objective:  Patient to be discharged per MD order. Orders in place for home health services. Previous RNCM had began workup for home health via [facility] to receive CHF protocol. Confirmed referral with [facility], protocol already received. Family to transport.                    Action/Plan:   Expected Discharge Date:  03/25/18               Expected Discharge Plan:  Home w Home Health Services  In-House Referral:     Discharge planning Services  CM Consult, HF Clinic  Post Acute Care Choice:  Home Health Choice offered to:  Patient  DME Arranged:    DME Agency:     HH Arranged:  RN, PT HH Agency:  [facility] (now [facility])  Status of Service:  Completed, signed off  If discussed at Long Length of Stay Meetings, dates discussed:    Additional Comments:  Virgel Manifold, RN 03/25/2018, 10:19 AM

## 2018-03-25 NOTE — Discharge Summary (Signed)
[employer] - [place] at [facility]  [name] [name], 58 y.o., DOB Mar 23, 1960, MRN [id number]. Admission date: 03/16/2018 Discharge Date 03/25/2018 Primary MD Patient, No Pcp Per Admitting Physician Altamese DillingVaibhavkumar Vachhani, MD  Admission Diagnosis  CHF (congestive heart failure) (HCC) [I50.9] Wound infection [T14.8XXA, L08.9] Cellulitis of left lower extremity [L03.116] Elevated troponin I level [R79.89] Acute on chronic respiratory failure, unspecified whether with hypoxia or hypercapnia (HCC) [J96.20] Chest pain, unspecified type [R07.9] Sepsis, due to unspecified organism, unspecified whether acute organ dysfunction present [facility](HCC) [A41.9]  Discharge Diagnosis   Active Problems: Sepsis due to cellulitis Acute respiratory failure with hypoxia due to acute on chronic systolic CHF Atrial fibrillation with rapid ventricular rate Diabetes type 2 COPD without exacerbation Sleep apnea Morbid obesity  Hospital Course [name] [name] a57 y.o.malewith a known history of A. fib, asthma, systolic CHF, COPD, sleep apnea-was recently admitted with CHF. At home he had worsening swelling on his legs for last few days and he had a big blister on his left leg which busted few days ago and since then there is some redness surrounding there and it is hurting him. He also complains of getting short of breath with minimal exertion.  He was admitted for cellulitis and sepsis.  He also was noted to have acute on chronic respiratory failure.  And treated for acute on chronic systolic CHF.  Patient is doing much better.  He also had A. fib with RVR and was seen by cardiology and underwent a cardioversion.            Consults  cardiology  Significant Tests:  See full reports for all details     Dg Chest 1 View  Result Date: 02/23/2018 CLINICAL DATA:  Left-sided chest pain and shortness of Breath EXAM: CHEST  1 VIEW COMPARISON:  01/27/2018 FINDINGS: Cardiac shadow is mildly enlarged  but stable. The lungs are well aerated bilaterally. No focal infiltrate or sizable effusion is seen. No acute bony abnormality is noted. IMPRESSION: No active disease. Electronically Signed   By: [name] [name] M.D.   On: 02/23/2018 17:46   Dg Chest 2 View  Result Date: 03/18/2018 CLINICAL DATA:  CHF. EXAM: CHEST - 2 VIEW COMPARISON:  Chest x-ray dated March 16, 2018. FINDINGS: Stable mild cardiomegaly and pulmonary vascular congestion. No focal consolidation, pleural effusion, or pneumothorax. No acute osseous abnormality. IMPRESSION: 1. Stable pulmonary vascular congestion without overt edema. Electronically Signed   By: [name] [name] M.D.   On: 03/18/2018 08:38   Dg Chest Portable 1 View  Result Date: 03/16/2018 CLINICAL DATA:  Chest pain, nausea, vomiting EXAM: PORTABLE CHEST 1 VIEW COMPARISON:  02/24/2018 FINDINGS: Cardiomegaly with vascular congestion. No overt edema. No confluent opacity or effusion. No acute bony abnormality. IMPRESSION: Cardiomegaly with vascular congestion. Electronically Signed   By: [name] [name] M.D.   On: 03/16/2018 23:03   Dg Chest Port 1 View  Result Date: 02/24/2018 CLINICAL DATA:  Acute respiratory failure. EXAM: PORTABLE CHEST 1 VIEW COMPARISON:  Radiograph of February 23, 2018. FINDINGS: Stable cardiomegaly. No pneumothorax is noted. No significant pleural effusion is noted. No consolidative process is noted. Bony thorax is unremarkable. IMPRESSION: No acute cardiopulmonary abnormality seen. Electronically Signed   By: [name] [name] Jr, M.D.   On: 02/24/2018 07:14       Today   Subjective:   [name] MajesticKenneth [name] patient doing much better breathing improved Objective:   Blood pressure 107/64, pulse 75, temperature 98.6 F (37 C), temperature source Oral, resp. rate 18, height  5\' 8"  (1.727 m), weight (!) 173.6 kg, SpO2 100 %.  .  Intake/Output Summary (Last 24 hours) at 03/25/2018 1459 Last data filed at 03/25/2018 0800 Gross per 24 hour  Intake -  Output 1650 ml   Net -1650 ml    Exam VITAL SIGNS: Blood pressure 107/64, pulse 75, temperature 98.6 F (37 C), temperature source Oral, resp. rate 18, height 5\' 8"  (1.727 m), weight (!) 173.6 kg, SpO2 100 %.  GENERAL:  58 y.o.-year-old patient lying in the bed with no acute distress.  EYES: Pupils equal, round, reactive to light and accommodation. No scleral icterus. Extraocular muscles intact.  HEENT: Head atraumatic, normocephalic. Oropharynx and nasopharynx clear.  NECK:  Supple, no jugular venous distention. No thyroid enlargement, no tenderness.  LUNGS: Normal breath sounds bilaterally, no wheezing, rales,rhonchi or crepitation. No use of accessory muscles of respiration.  CARDIOVASCULAR: S1, S2 normal. No murmurs, rubs, or gallops.  ABDOMEN: Soft, nontender, nondistended. Bowel sounds present. No organomegaly or mass.  EXTREMITIES: No pedal edema, cyanosis, or clubbing.  NEUROLOGIC: Cranial nerves II through XII are intact. Muscle strength 5/5 in all extremities. Sensation intact. Gait not checked.  PSYCHIATRIC: The patient is alert and oriented x 3.  SKIN: No obvious rash, lesion, or ulcer.   Data Review     CBC w Diff:  Lab Results  Component Value Date   WBC 5.6 03/24/2018   HGB 10.7 (L) 03/24/2018   HCT 35.7 (L) 03/24/2018   PLT 222 03/24/2018   LYMPHOPCT 21 03/24/2018   MONOPCT 23 03/24/2018   EOSPCT 5 03/24/2018   BASOPCT 1 03/24/2018   CMP:  Lab Results  Component Value Date   NA 135 03/24/2018   K 4.0 03/24/2018   CL 98 03/24/2018   CO2 30 03/24/2018   BUN 25 (H) 03/24/2018   CREATININE 1.04 03/24/2018   PROT 6.5 03/16/2018   ALBUMIN 3.3 (L) 03/16/2018   BILITOT 0.8 03/16/2018   ALKPHOS 56 03/16/2018   AST 27 03/16/2018   ALT 16 03/16/2018  .  Micro Results Recent Results (from the past 240 hour(s))  Blood Culture (routine x 2)     Status: None   Collection Time: 03/16/18 11:24 PM  Result Value Ref Range Status   Specimen Description BLOOD RIGHT ASSIST CONTROL   Final   Special Requests   Final    BOTTLES DRAWN AEROBIC AND ANAEROBIC Blood Culture results may not be optimal due to an excessive volume of blood received in culture bottles   Culture   Final    NO GROWTH 5 DAYS Performed at [facility], [address] [place], [place] [address]    Report Status 03/21/2018 FINAL  Final  Blood Culture (routine x 2)     Status: None   Collection Time: 03/16/18 11:25 PM  Result Value Ref Range Status   Specimen Description BLOOD RIGHT HAND  Final   Special Requests   Final    BOTTLES DRAWN AEROBIC AND ANAEROBIC Blood Culture adequate volume   Culture   Final    NO GROWTH 5 DAYS Performed at [facility], [address], [place], [place] [address]    Report Status 03/21/2018 FINAL  Final  Wound or Superficial Culture     Status: None   Collection Time: 03/16/18 11:25 PM  Result Value Ref Range Status   Specimen Description   Final    LEG LEFT LOWER Performed at [facility], [address], [place], [place] [address]  Special Requests   Final    Normal Performed at [facility], [address] [place], [place] [address]    Gram Stain   Final    NO WBC SEEN RARE GRAM POSITIVE COCCI IN PAIRS Performed at [facility], [address], [place], [place] [address]    Culture FEW STAPHYLOCOCCUS AUREUS  Final   Report Status 03/19/2018 FINAL  Final   Organism ID, Bacteria STAPHYLOCOCCUS AUREUS  Final      Susceptibility   Staphylococcus aureus - MIC*    CIPROFLOXACIN <=0.5 SENSITIVE Sensitive     ERYTHROMYCIN >=8 RESISTANT Resistant     GENTAMICIN <=0.5 SENSITIVE Sensitive     OXACILLIN 0.5 SENSITIVE Sensitive     TETRACYCLINE >=16 RESISTANT Resistant     VANCOMYCIN <=0.5 SENSITIVE Sensitive     TRIMETH/SULFA <=10 SENSITIVE Sensitive     CLINDAMYCIN RESISTANT Resistant     RIFAMPIN <=0.5 SENSITIVE Sensitive     Inducible Clindamycin POSITIVE Resistant     * FEW STAPHYLOCOCCUS AUREUS  Urine  culture     Status: Abnormal   Collection Time: 03/17/18  3:15 AM  Result Value Ref Range Status   Specimen Description   Final    URINE, RANDOM Performed at [facility] [address] [place], [place] [address]    Special Requests   Final    NONE Performed at [facility], [address], [place], [place] [address]    Culture (A)  Final    <10,000 COLONIES/mL INSIGNIFICANT GROWTH Performed at [facility], [address], [place], [place] [address]    Report Status 03/18/2018 FINAL  Final        Code Status Orders  (From admission, onward)         Start     Ordered   03/17/18 0043  Full code  Continuous     03/17/18 0042        Code Status History    Date Active Date Inactive Code Status Order ID Comments User Context   02/24/2018 0725 02/27/2018 1555 Full Code [id number]  Oralia Manis, MD Inpatient   01/27/2018 1345 01/29/2018 1406 Full Code [id number]  Alford Highland, MD ED   01/17/2018 1330 01/19/2018 1818 Partial Code [id number]  Milagros Loll, MD Inpatient   01/16/2018 1427 01/17/2018 1330 Full Code [id number]  Auburn Bilberry, MD ED          Follow-up Information    [facility] Follow up on 03/31/2018.   Specialty:  Cardiology Why:  at 1:40pm Contact information: [address] [place] [address] [phone]       Galen Manila, NP On 04/03/2018.   Specialty:  Nurse Practitioner Why:  Appointment Time: @ 2:20 Contact information: [address] [place] [address] [phone]        [name], MD Follow up in 1 week(s).   Specialty:  Cardiology Why:  chf afib hosp f/u Contact information: [address] [place] [address] [phone]           Discharge Medications   Allergies as of 03/25/2018   No Known Allergies     Medication List    STOP taking these medications   metoprolol tartrate 50 MG  tablet Commonly known as:  LOPRESSOR   potassium chloride 10 MEQ tablet Commonly known as:  K-DUR     TAKE these medications   albuterol 108 (  90 Base) MCG/ACT inhaler Commonly known as:  PROVENTIL HFA;VENTOLIN HFA Inhale 2 puffs into the lungs every 6 (six) hours as needed for wheezing or shortness of breath.   aspirin 325 MG tablet Take 325 mg by mouth daily.   metoprolol succinate 50 MG 24 hr tablet Commonly known as:  TOPROL-XL Take 1 tablet (50 mg total) by mouth 2 (two) times daily. Take with or immediately following a meal.   nitroGLYCERIN 0.4 MG SL tablet Commonly known as:  NITROSTAT Place 1 tablet (0.4 mg total) under the tongue every 5 (five) minutes as needed for chest pain.   oxyCODONE-acetaminophen 5-325 MG tablet Commonly known as:  PERCOCET/ROXICET Take 1 tablet by mouth every 6 (six) hours as needed for moderate pain.   potassium chloride SA 20 MEQ tablet Commonly known as:  K-DUR,KLOR-CON Take 1 tablet (20 mEq total) by mouth 2 (two) times daily.   rivaroxaban 20 MG Tabs tablet Commonly known as:  XARELTO Take 1 tablet (20 mg total) by mouth daily.   sacubitril-valsartan 24-26 MG Commonly known as:  ENTRESTO Take 1 tablet by mouth 2 (two) times daily. D/C lisinopril   spironolactone 25 MG tablet Commonly known as:  ALDACTONE Take 1 tablet (25 mg total) by mouth daily.   torsemide 100 MG tablet Commonly known as:  DEMADEX Take 1 tablet (100 mg total) by mouth daily. What changed:    medication strength  how much to take  when to take this   traZODone 150 MG tablet Commonly known as:  DESYREL Take 1 tablet (150 mg total) by mouth at bedtime as needed for sleep.          Total Time in preparing paper work, data evaluation and todays exam - 35 minutes  Auburn BilberryShreyang Indonesia Mckeough M.D on 03/25/2018 at 2:59 PM [employer] [facility]   Office  [phone]

## 2018-03-25 NOTE — Final Progress Note (Signed)
Patient discharged to home. Tele and IV discontinued and removed.  education and discharge information given to patient. Patient instructed prescriptions were sent to [facility] on [address], at pt's request.

## 2018-03-26 ENCOUNTER — Encounter: Payer: Self-pay | Admitting: Cardiovascular Disease

## 2018-03-28 ENCOUNTER — Ambulatory Visit: Payer: Medicaid - Out of State | Admitting: Cardiovascular Disease

## 2018-03-31 ENCOUNTER — Ambulatory Visit: Payer: Medicaid Other | Admitting: Family

## 2018-04-03 ENCOUNTER — Ambulatory Visit: Payer: Self-pay | Admitting: Nurse Practitioner

## 2018-04-06 ENCOUNTER — Telehealth: Payer: Self-pay | Admitting: Family

## 2018-04-06 ENCOUNTER — Ambulatory Visit: Payer: Medicaid Other | Admitting: Family

## 2018-04-06 NOTE — Progress Notes (Deleted)
Patient ID: [name] [name], male    DOB: July 21, 1960, 58 y.o.   MRN: [id number]  HPI  [name] [name] is a 58 y/o male with a history of asthma, COPD, atrial fibrillation, obstructive sleep apnea and chronic heart failure.   Echo report from 03/24/2018 reviewed and showed an EF of 25-30%. Echo report from 01/17/18 reviewed and showed an EF of 30%.  Admitted 03/16/2018 due to sepsis due to cellulitis of legs along with HF. EP and cardiology consults were obtained. Initially needed IV lasix and then transitioned to oral diuretics. Was cardioverted due to his atrial fibrillation. Discharged after 9 days.       Admitted 02/23/2018 due to COPD/HF exacerbation. Initially needed bipap but then was transitioned to room air. Given steroids and nebulizers. Counseled regarding low sodium diet although he says he's going to [employer] once discharged. Discharged after 4 days. Admitted 01/27/18 due to acute on chronic HF. Initially given IV lasix and then transitioned to oral diuretics. Also needed bipap initially and then was transitioned off. Troponin elevation thought to be due to demand ischemia. Discharged after 2 days. Admitted 01/16/18 due to HF/COPD exacerbation. Initially required bipap and then transitioned to room air. Had recently moved from [facility] and had been without some of his medications. Discharged after 3 days.   He presents today for a follow-up visit with a chief complaint of              Past Medical History:  Diagnosis Date  . A-fib (HCC)   . Asthma   . CHF (congestive heart failure) (HCC)   . COPD (chronic obstructive pulmonary disease) (HCC)   . Sleep apnea    Past Surgical History:  Procedure Laterality Date  . CARDIOVERSION N/A 03/24/2018   Procedure: CARDIOVERSION;  Surgeon: Antonieta Iba, MD;  Location: [facility] ORS;  Service: Cardiovascular;  Laterality: N/A;  . hearnia repair    . LEG SURGERY     Family History  Problem Relation Age of Onset  . Heart failure Mother   . Lung cancer  Mother   . Lung cancer Father    Social History   Tobacco Use  . Smoking status: Never Smoker  . Smokeless tobacco: Never Used  Substance Use Topics  . Alcohol use: Never    Frequency: Never   No Known Allergies   Review of Systems  Constitutional: Positive for fatigue (with minimal exertion).  HENT: Positive for rhinorrhea. Negative for congestion and sore throat.   Eyes: Negative.   Respiratory: Positive for shortness of breath (with minimal exertion).   Cardiovascular: Positive for chest pain (under left breast), palpitations and leg swelling.  Gastrointestinal: Negative for abdominal distention and abdominal pain.  Endocrine: Negative.   Genitourinary: Negative.   Musculoskeletal: Positive for arthralgias (knees/ hip/ right shoulder) and back pain (chronic).  Skin: Positive for wound (left lower leg).  Allergic/Immunologic: Negative.   Neurological: Positive for dizziness. Negative for light-headedness.  Hematological: Negative for adenopathy. Does not bruise/bleed easily.  Psychiatric/Behavioral: Positive for sleep disturbance (wearing CPAP at night). Negative for dysphoric mood. The patient is not nervous/anxious.      Physical Exam Vitals signs and nursing note reviewed.  Constitutional:      Appearance: He is well-developed.  HENT:     Head: Normocephalic and atraumatic.  Neck:     Musculoskeletal: Normal range of motion and neck supple.     Vascular: No JVD.  Cardiovascular:     Rate and Rhythm: Normal rate and  regular rhythm.  Pulmonary:     Effort: Pulmonary effort is normal.     Breath sounds: Normal breath sounds. No wheezing or rales.  Abdominal:     Palpations: Abdomen is soft.     Tenderness: There is no abdominal tenderness.  Musculoskeletal:     Right lower leg: He exhibits no tenderness. Edema (2+ pitting) present.     Left lower leg: He exhibits no tenderness. Edema (2+ pitting) present.  Skin:    General: Skin is warm and dry.  Neurological:      General: No focal deficit present.     Mental Status: He is alert and oriented to person, place, and time.  Psychiatric:        Mood and Affect: Mood normal.        Behavior: Behavior normal.    Assessment & Plan:  1: Chronic heart failure with reduced ejection fraction- - NYHA class III - euvolemic today - weighing daily; reminded to weigh himself every morning, write the weight down and call for an overnight weight gain of >2 pounds or a weekly weight gain of >5 pounds - weight  - not adding salt and has recently started reading food labels. Reviewed the importance of closely following a 2000mg  sodium diet   - says that he drinks "a lot" of milk   - - has not gotten established with cardiology yet and prefers [facility]  - BMP from 03/24/2018 reviewed and showed sodium 135, potassium 4.0, creatinine 1.04 and GFR >60 - BNP 03/16/2018 was 257.0 - does not get a flu vaccine; good handwashing encouraged - discuss paramedicine program  2: COPD- - lungs clear today [facility] pulmonology contact information given to patient  3: Obstructive sleep apnea- - wearing CPAP nightly - sleeping well other than he goes through periods of insomnia  4: Lymphedema- - stage 2 - does wear compression socks on occasion; encouraged him to wear them daily with removal at bedtime - elevates his legs "some"; instructed to elevate his legs when sitting for long periods of time - limited in his ability to exercise due to chronic back pain - discussed lymphapress compression boots if edema persists  Patient did not bring his medications nor a list. Each medication was verbally reviewed with the patient and he was encouraged to bring the bottles to every visit to confirm accuracy of list.

## 2018-04-06 NOTE — Telephone Encounter (Signed)
Patient did not show for his [facility] Failure [facility] appointment on 04/06/2018. Will attempt to reschedule.  

## 2018-05-08 ENCOUNTER — Ambulatory Visit: Payer: Medicaid Other | Admitting: Family Medicine

## 2018-05-29 ENCOUNTER — Other Ambulatory Visit: Payer: Self-pay

## 2018-05-29 ENCOUNTER — Inpatient Hospital Stay
Admission: EM | Admit: 2018-05-29 | Discharge: 2018-06-01 | DRG: 308 | Disposition: A | Discharge status: Home / Self Care | Payer: Medicaid Other | Attending: Family Medicine | Admitting: Family Medicine

## 2018-05-29 ENCOUNTER — Encounter: Payer: Self-pay | Admitting: Emergency Medicine

## 2018-05-29 ENCOUNTER — Emergency Department: Payer: Medicaid Other

## 2018-05-29 DIAGNOSIS — I5022 Chronic systolic (congestive) heart failure: Secondary | ICD-10-CM | POA: Diagnosis present

## 2018-05-29 DIAGNOSIS — Z7901 Long term (current) use of anticoagulants: Secondary | ICD-10-CM | POA: Diagnosis not present

## 2018-05-29 DIAGNOSIS — R0902 Hypoxemia: Secondary | ICD-10-CM | POA: Diagnosis not present

## 2018-05-29 DIAGNOSIS — G894 Chronic pain syndrome: Secondary | ICD-10-CM | POA: Diagnosis present

## 2018-05-29 DIAGNOSIS — R112 Nausea with vomiting, unspecified: Secondary | ICD-10-CM

## 2018-05-29 DIAGNOSIS — J988 Other specified respiratory disorders: Secondary | ICD-10-CM | POA: Diagnosis not present

## 2018-05-29 DIAGNOSIS — J9601 Acute respiratory failure with hypoxia: Secondary | ICD-10-CM | POA: Diagnosis not present

## 2018-05-29 DIAGNOSIS — Z801 Family history of malignant neoplasm of trachea, bronchus and lung: Secondary | ICD-10-CM | POA: Diagnosis not present

## 2018-05-29 DIAGNOSIS — R0600 Dyspnea, unspecified: Secondary | ICD-10-CM | POA: Diagnosis not present

## 2018-05-29 DIAGNOSIS — I420 Dilated cardiomyopathy: Secondary | ICD-10-CM | POA: Diagnosis not present

## 2018-05-29 DIAGNOSIS — J449 Chronic obstructive pulmonary disease, unspecified: Secondary | ICD-10-CM | POA: Diagnosis present

## 2018-05-29 DIAGNOSIS — M1990 Unspecified osteoarthritis, unspecified site: Secondary | ICD-10-CM | POA: Diagnosis present

## 2018-05-29 DIAGNOSIS — G4733 Obstructive sleep apnea (adult) (pediatric): Secondary | ICD-10-CM | POA: Diagnosis not present

## 2018-05-29 DIAGNOSIS — Z6841 Body Mass Index (BMI) 40.0 and over, adult: Secondary | ICD-10-CM

## 2018-05-29 DIAGNOSIS — Z20828 Contact with and (suspected) exposure to other viral communicable diseases: Secondary | ICD-10-CM | POA: Diagnosis present

## 2018-05-29 DIAGNOSIS — J9690 Respiratory failure, unspecified, unspecified whether with hypoxia or hypercapnia: Secondary | ICD-10-CM | POA: Diagnosis present

## 2018-05-29 DIAGNOSIS — Z8249 Family history of ischemic heart disease and other diseases of the circulatory system: Secondary | ICD-10-CM

## 2018-05-29 DIAGNOSIS — I4819 Other persistent atrial fibrillation: Principal | ICD-10-CM | POA: Diagnosis present

## 2018-05-29 DIAGNOSIS — N3090 Cystitis, unspecified without hematuria: Secondary | ICD-10-CM | POA: Diagnosis not present

## 2018-05-29 DIAGNOSIS — R069 Unspecified abnormalities of breathing: Secondary | ICD-10-CM | POA: Diagnosis not present

## 2018-05-29 DIAGNOSIS — I5043 Acute on chronic combined systolic (congestive) and diastolic (congestive) heart failure: Secondary | ICD-10-CM | POA: Diagnosis not present

## 2018-05-29 DIAGNOSIS — I10 Essential (primary) hypertension: Secondary | ICD-10-CM | POA: Diagnosis not present

## 2018-05-29 DIAGNOSIS — R0602 Shortness of breath: Secondary | ICD-10-CM | POA: Diagnosis not present

## 2018-05-29 DIAGNOSIS — J069 Acute upper respiratory infection, unspecified: Secondary | ICD-10-CM | POA: Diagnosis not present

## 2018-05-29 DIAGNOSIS — M1A9XX0 Chronic gout, unspecified, without tophus (tophi): Secondary | ICD-10-CM | POA: Diagnosis present

## 2018-05-29 DIAGNOSIS — I4891 Unspecified atrial fibrillation: Secondary | ICD-10-CM | POA: Diagnosis not present

## 2018-05-29 DIAGNOSIS — R0689 Other abnormalities of breathing: Secondary | ICD-10-CM | POA: Diagnosis not present

## 2018-05-29 HISTORY — DX: Unspecified systolic (congestive) heart failure: I50.20

## 2018-05-29 HISTORY — DX: Other persistent atrial fibrillation: I48.19

## 2018-05-29 LAB — URINALYSIS, COMPLETE (UACMP) WITH MICROSCOPIC
Bacteria, UA: NONE SEEN
Bilirubin Urine: NEGATIVE
Glucose, UA: NEGATIVE mg/dL
Hgb urine dipstick: NEGATIVE
Ketones, ur: NEGATIVE mg/dL
Nitrite: NEGATIVE
Protein, ur: 30 mg/dL — AB
Specific Gravity, Urine: 1.019 (ref 1.005–1.030)
pH: 6 (ref 5.0–8.0)

## 2018-05-29 LAB — RESPIRATORY PANEL BY PCR

## 2018-05-29 LAB — CBC WITH DIFFERENTIAL/PLATELET
Abs Immature Granulocytes: 0.03 10*3/uL (ref 0.00–0.07)
Basophils Absolute: 0.1 10*3/uL (ref 0.0–0.1)
Basophils Relative: 1 %
Eosinophils Absolute: 0.1 10*3/uL (ref 0.0–0.5)
Eosinophils Relative: 3 %
HCT: 35 % — ABNORMAL LOW (ref 39.0–52.0)
Hemoglobin: 10.8 g/dL — ABNORMAL LOW (ref 13.0–17.0)
Immature Granulocytes: 1 %
Lymphocytes Relative: 16 %
Lymphs Abs: 0.8 10*3/uL (ref 0.7–4.0)
MCH: 25.4 pg — ABNORMAL LOW (ref 26.0–34.0)
MCHC: 30.9 g/dL (ref 30.0–36.0)
MCV: 82.2 fL (ref 80.0–100.0)
Monocytes Absolute: 0.4 10*3/uL (ref 0.1–1.0)
Monocytes Relative: 8 %
Neutro Abs: 3.6 10*3/uL (ref 1.7–7.7)
Neutrophils Relative %: 71 %
Platelets: 194 10*3/uL (ref 150–400)
RBC: 4.26 MIL/uL (ref 4.22–5.81)
RDW: 17.8 % — ABNORMAL HIGH (ref 11.5–15.5)
WBC: 5 10*3/uL (ref 4.0–10.5)
nRBC: 0 % (ref 0.0–0.2)

## 2018-05-29 LAB — INFLUENZA PANEL BY PCR (TYPE A & B)
Influenza A By PCR: NEGATIVE
Influenza B By PCR: NEGATIVE

## 2018-05-29 LAB — COMPREHENSIVE METABOLIC PANEL
ALT: 12 U/L (ref 0–44)
AST: 23 U/L (ref 15–41)
Albumin: 3.2 g/dL — ABNORMAL LOW (ref 3.5–5.0)
Alkaline Phosphatase: 85 U/L (ref 38–126)
Anion gap: 11 (ref 5–15)
BUN: 6 mg/dL (ref 6–20)
CO2: 23 mmol/L (ref 22–32)
Calcium: 8.1 mg/dL — ABNORMAL LOW (ref 8.9–10.3)
Chloride: 103 mmol/L (ref 98–111)
Creatinine, Ser: 0.84 mg/dL (ref 0.61–1.24)
GFR calc Af Amer: >60 mL/min (ref >60)
GFR calc non Af Amer: >60 mL/min (ref >60)
Glucose, Bld: 144 mg/dL — ABNORMAL HIGH (ref 70–99)
Potassium: 2.9 mmol/L — ABNORMAL LOW (ref 3.5–5.1)
Sodium: 137 mmol/L (ref 135–145)
Total Bilirubin: 0.8 mg/dL (ref 0.3–1.2)
Total Protein: 7.3 g/dL (ref 6.5–8.1)

## 2018-05-29 LAB — BLOOD GAS, VENOUS
Acid-Base Excess: 2.7 mmol/L — ABNORMAL HIGH (ref 0.0–2.0)
Bicarbonate: 26.2 mmol/L (ref 20.0–28.0)
O2 Saturation: 94.2 %
Patient temperature: 37
pCO2, Ven: 36 mmHg — ABNORMAL LOW (ref 44.0–60.0)
pH, Ven: 7.47 — ABNORMAL HIGH (ref 7.250–7.430)
pO2, Ven: 67 mmHg — ABNORMAL HIGH (ref 32.0–45.0)

## 2018-05-29 LAB — SARS CORONAVIRUS 2 BY RT PCR (HOSPITAL ORDER, PERFORMED IN ~~LOC~~ HOSPITAL LAB): SARS Coronavirus 2: NEGATIVE

## 2018-05-29 LAB — BRAIN NATRIURETIC PEPTIDE: B Natriuretic Peptide: 289 pg/mL — ABNORMAL HIGH (ref 0.0–100.0)

## 2018-05-29 LAB — TSH: TSH: 2.812 u[IU]/mL (ref 0.350–4.500)

## 2018-05-29 LAB — MAGNESIUM: Magnesium: 1.7 mg/dL (ref 1.7–2.4)

## 2018-05-29 MED ORDER — PROMETHAZINE HCL 25 MG/ML IJ SOLN
12.5000 mg | Freq: Once | INTRAMUSCULAR | Status: AC
  Administered 2018-05-29: 12.5 mg via INTRAVENOUS
  Filled 2018-05-29: qty 1

## 2018-05-29 MED ORDER — ONDANSETRON HCL 4 MG/2ML IJ SOLN
INTRAMUSCULAR | Status: AC
  Administered 2018-05-29: 8 mg
  Filled 2018-05-29: qty 4

## 2018-05-29 MED ORDER — TORSEMIDE 20 MG PO TABS
100.0000 mg | ORAL_TABLET | Freq: Every day | ORAL | Status: DC
  Administered 2018-05-29: 100 mg via ORAL
  Filled 2018-05-29: qty 5

## 2018-05-29 MED ORDER — IPRATROPIUM-ALBUTEROL 20-100 MCG/ACT IN AERS
1.0000 | INHALATION_SPRAY | Freq: Four times a day (QID) | RESPIRATORY_TRACT | Status: DC | PRN
  Filled 2018-05-29: qty 4

## 2018-05-29 MED ORDER — ACETAMINOPHEN 325 MG PO TABS: 650.0000 mg | ORAL_TABLET | Freq: Four times a day (QID) | ORAL | Status: DC | PRN

## 2018-05-29 MED ORDER — ONDANSETRON HCL 4 MG/2ML IJ SOLN
4.0000 mg | Freq: Four times a day (QID) | INTRAMUSCULAR | Status: DC | PRN
  Administered 2018-05-29: 4 mg via INTRAVENOUS
  Filled 2018-05-29: qty 2

## 2018-05-29 MED ORDER — ONDANSETRON HCL 4 MG PO TABS: 4.0000 mg | ORAL_TABLET | Freq: Four times a day (QID) | ORAL | Status: DC | PRN

## 2018-05-29 MED ORDER — POTASSIUM CHLORIDE CRYS ER 20 MEQ PO TBCR
40.0000 meq | EXTENDED_RELEASE_TABLET | Freq: Two times a day (BID) | ORAL | Status: DC
  Administered 2018-05-29 – 2018-06-01 (×6): 40 meq via ORAL
  Filled 2018-05-29 (×6): qty 2

## 2018-05-29 MED ORDER — HYDROCODONE-ACETAMINOPHEN 5-325 MG PO TABS
1.0000 | ORAL_TABLET | ORAL | Status: DC | PRN
  Administered 2018-05-29 – 2018-06-01 (×11): 2 via ORAL
  Filled 2018-05-29 (×11): qty 2

## 2018-05-29 MED ORDER — METOPROLOL TARTRATE 50 MG PO TABS
50.0000 mg | ORAL_TABLET | Freq: Once | ORAL | Status: AC
  Administered 2018-05-29: 50 mg via ORAL
  Filled 2018-05-29: qty 1

## 2018-05-29 MED ORDER — LISINOPRIL 5 MG PO TABS
2.5000 mg | ORAL_TABLET | Freq: Every day | ORAL | Status: DC
  Administered 2018-05-29 – 2018-05-31 (×3): 2.5 mg via ORAL
  Filled 2018-05-29 (×3): qty 1

## 2018-05-29 MED ORDER — SODIUM CHLORIDE 0.9 % IV SOLN
8.0000 mg | Freq: Once | INTRAVENOUS | Status: DC
  Filled 2018-05-29: qty 4

## 2018-05-29 MED ORDER — SODIUM CHLORIDE 0.9% FLUSH
3.0000 mL | Freq: Two times a day (BID) | INTRAVENOUS | Status: DC
  Administered 2018-05-29 – 2018-06-01 (×7): 3 mL via INTRAVENOUS

## 2018-05-29 MED ORDER — SODIUM CHLORIDE 0.9% FLUSH: 3.0000 mL | INTRAVENOUS | Status: DC | PRN

## 2018-05-29 MED ORDER — SODIUM CHLORIDE 0.9% FLUSH
3.0000 mL | Freq: Two times a day (BID) | INTRAVENOUS | Status: DC
  Administered 2018-05-29 – 2018-06-01 (×6): 3 mL via INTRAVENOUS

## 2018-05-29 MED ORDER — METOPROLOL TARTRATE 5 MG/5ML IV SOLN: 5.0000 mg | Freq: Three times a day (TID) | INTRAVENOUS | Status: DC | PRN

## 2018-05-29 MED ORDER — METOPROLOL SUCCINATE ER 50 MG PO TB24
50.0000 mg | ORAL_TABLET | Freq: Two times a day (BID) | ORAL | Status: DC
  Administered 2018-05-29 – 2018-06-01 (×7): 50 mg via ORAL
  Filled 2018-05-29 (×7): qty 1

## 2018-05-29 MED ORDER — NITROGLYCERIN 0.4 MG SL SUBL: 0.4000 mg | SUBLINGUAL_TABLET | SUBLINGUAL | Status: DC | PRN

## 2018-05-29 MED ORDER — SODIUM CHLORIDE 0.9 % IV SOLN: 250.0000 mL | INTRAVENOUS | Status: DC | PRN

## 2018-05-29 MED ORDER — ACETAMINOPHEN 650 MG RE SUPP: 650.0000 mg | Freq: Four times a day (QID) | RECTAL | Status: DC | PRN

## 2018-05-29 MED ORDER — TRAZODONE HCL 50 MG PO TABS
150.0000 mg | ORAL_TABLET | Freq: Every evening | ORAL | Status: DC | PRN
  Administered 2018-05-30 – 2018-05-31 (×2): 150 mg via ORAL
  Filled 2018-05-29 (×2): qty 1

## 2018-05-29 MED ORDER — METOPROLOL TARTRATE 5 MG/5ML IV SOLN
5.0000 mg | Freq: Once | INTRAVENOUS | Status: AC
  Administered 2018-05-29: 5 mg via INTRAVENOUS
  Filled 2018-05-29: qty 5

## 2018-05-29 MED ORDER — AMIODARONE HCL 200 MG PO TABS
400.0000 mg | ORAL_TABLET | Freq: Two times a day (BID) | ORAL | Status: DC
  Administered 2018-05-29 – 2018-06-01 (×7): 400 mg via ORAL
  Filled 2018-05-29 (×7): qty 2

## 2018-05-29 MED ORDER — POTASSIUM CHLORIDE 20 MEQ PO PACK
60.0000 meq | PACK | Freq: Once | ORAL | Status: AC
  Administered 2018-05-29: 17:00:00 60 meq via ORAL
  Filled 2018-05-29: qty 3

## 2018-05-29 MED ORDER — ALBUTEROL SULFATE (2.5 MG/3ML) 0.083% IN NEBU: 2.5000 mg | INHALATION_SOLUTION | Freq: Four times a day (QID) | RESPIRATORY_TRACT | Status: DC | PRN

## 2018-05-29 MED ORDER — POLYETHYLENE GLYCOL 3350 17 G PO PACK: 17.0000 g | PACK | Freq: Every day | ORAL | Status: DC | PRN

## 2018-05-29 MED ORDER — RIVAROXABAN 20 MG PO TABS
20.0000 mg | ORAL_TABLET | Freq: Every evening | ORAL | Status: DC
  Administered 2018-05-29 – 2018-05-31 (×3): 20 mg via ORAL
  Filled 2018-05-29 (×3): qty 1

## 2018-05-29 MED ORDER — SPIRONOLACTONE 25 MG PO TABS
25.0000 mg | ORAL_TABLET | Freq: Every day | ORAL | Status: DC
  Administered 2018-05-29 – 2018-05-31 (×3): 25 mg via ORAL
  Filled 2018-05-29 (×3): qty 1

## 2018-05-29 MED ORDER — ALBUTEROL SULFATE HFA 108 (90 BASE) MCG/ACT IN AERS
1.0000 | INHALATION_SPRAY | Freq: Four times a day (QID) | RESPIRATORY_TRACT | Status: DC | PRN
  Filled 2018-05-29: qty 6.7

## 2018-05-29 NOTE — ED Provider Notes (Signed)
[facility] Emergency Department Provider Note       Time seen: ----------------------------------------- 7:45 AM on 05/29/2018 -----------------------------------------   I have reviewed the triage vital signs and the nursing notes.  HISTORY   Chief Complaint No chief complaint on file.    HPI [name] [name] is a 58 y.o. male with a history of atrial fibrillation, asthma, CHF, COPD, sleep apnea who presents to the ED for shortness of breath and vomiting.  Patient states he woke up feeling ill, had fever, chills and aches.  He has had vomiting and shortness of breath.  He has not had any sick contacts, denies any diarrhea.  He was initially 88% on room air, does not normally wear oxygen.  Past Medical History:  Diagnosis Date  . A-fib (HCC)   . Asthma   . CHF (congestive heart failure) (HCC)   . COPD (chronic obstructive pulmonary disease) (HCC)   . Sleep apnea     Patient Active Problem List   Diagnosis Date Noted  . Persistent atrial fibrillation   . Sepsis (HCC) 03/17/2018  . Cellulitis 03/17/2018  . Chest pain 02/23/2018  . Acute on chronic systolic CHF (congestive heart failure) (HCC) 02/02/2018  . COPD (chronic obstructive pulmonary disease) (HCC) 02/02/2018  . Obstructive sleep apnea 02/02/2018  . Lymphedema 02/02/2018  . Acute respiratory failure with hypoxia (HCC) 01/27/2018  . Dyspnea   . SOB (shortness of breath)   . Acute respiratory failure (HCC) 01/16/2018    Past Surgical History:  Procedure Laterality Date  . CARDIOVERSION N/A 03/24/2018   Procedure: CARDIOVERSION;  Surgeon: Antonieta Iba, MD;  Location: [facility] ORS;  Service: Cardiovascular;  Laterality: N/A;  . hearnia repair    . LEG SURGERY      Allergies Patient has no known allergies.  Social History Social History   Tobacco Use  . Smoking status: Never Smoker  . Smokeless tobacco: Never Used  Substance Use Topics  . Alcohol use: Never    Frequency: Never   . Drug use: Never   Review of Systems Constitutional: Positive for fevers, chills, aches Cardiovascular: Negative for chest pain. Respiratory: Positive for shortness of breath Gastrointestinal: Negative for abdominal pain, positive for vomiting Musculoskeletal: Negative for back pain. Skin: Negative for rash. Neurological: Negative for headaches, focal weakness or numbness.  All systems negative/normal/unremarkable except as stated in the HPI  ____________________________________________   PHYSICAL EXAM:  VITAL SIGNS: ED Triage Vitals  Enc Vitals Group     BP      Pulse      Resp      Temp      Temp src      SpO2      Weight      Height      Head Circumference      Peak Flow      Pain Score      Pain Loc      Pain Edu?      Excl. in GC?    Constitutional: Alert and oriented.  Mild distress, intermittently gagging Eyes: Conjunctivae are normal. Normal extraocular movements. ENT      Head: Normocephalic and atraumatic.      Nose: No congestion/rhinnorhea.      Mouth/Throat: Mucous membranes are moist.      Neck: No stridor. Cardiovascular: Rapid rate, irregular rhythm. No murmurs, rubs, or gallops. Respiratory: Normal respiratory effort without tachypnea nor retractions. Breath sounds are clear and equal bilaterally. No wheezes/rales/rhonchi. Gastrointestinal: Soft and nontender.  Normal bowel sounds Musculoskeletal: Nontender with normal range of motion in extremities.  Peripheral edema is noted Neurologic:  Normal speech and language. No gross focal neurologic deficits are appreciated.  Skin:  Skin is warm, dry and intact. No rash noted. Psychiatric: Mood and affect are normal. Speech and behavior are normal.  ____________________________________________  EKG: Interpreted by me.  Atrial flutter/fibrillation, rate is 121 bpm, wide QRS, normal axis, normal QT  ____________________________________________  ED COURSE:  As part of my medical decision making, I  reviewed the following data within the electronic MEDICAL RECORD NUMBER History obtained from family if available, nursing notes, old chart and ekg, as well as notes from prior ED visits. Patient presented for flulike symptoms and vomiting, we will assess with labs and imaging as indicated at this time.   Procedures  [name] [name] was evaluated in Emergency Department on 05/29/2018 for the symptoms described in the history of present illness. He was evaluated in the context of the global COVID-19 pandemic, which necessitated consideration that the patient might be at risk for infection with the SARS-CoV-2 virus that causes COVID-19. Institutional protocols and algorithms that pertain to the evaluation of patients at risk for COVID-19 are in a state of rapid change based on information released by regulatory bodies including the CDC and federal and state organizations. These policies and algorithms were followed during the patient's care in the ED.  ____________________________________________   LABS (pertinent positives/negatives)  Labs Reviewed  CBC WITH DIFFERENTIAL/PLATELET - Abnormal; Notable for the following components:      Result Value   Hemoglobin 10.8 (*)    HCT 35.0 (*)    MCH 25.4 (*)    RDW 17.8 (*)    All other components within normal limits  BRAIN NATRIURETIC PEPTIDE - Abnormal; Notable for the following components:   B Natriuretic Peptide 289.0 (*)    All other components within normal limits  COMPREHENSIVE METABOLIC PANEL - Abnormal; Notable for the following components:   Potassium 2.9 (*)    Glucose, Bld 144 (*)    Calcium 8.1 (*)    Albumin 3.2 (*)    All other components within normal limits  URINALYSIS, COMPLETE (UACMP) WITH MICROSCOPIC - Abnormal; Notable for the following components:   Color, Urine YELLOW (*)    APPearance HAZY (*)    Protein, ur 30 (*)    Leukocytes,Ua MODERATE (*)    All other components within normal limits  BLOOD GAS, VENOUS - Abnormal;  Notable for the following components:   pH, Ven 7.47 (*)    pCO2, Ven 36 (*)    pO2, Ven 67.0 (*)    Acid-Base Excess 2.7 (*)    All other components within normal limits  NOVEL CORONAVIRUS, NAA (HOSPITAL ORDER, SEND-OUT TO REF LAB)   CRITICAL CARE Performed by: Ulice DashJohnathan E Williams   Total critical care time: 30 minutes  Critical care time was exclusive of separately billable procedures and treating other patients.  Critical care was necessary to treat or prevent imminent or life-threatening deterioration.  Critical care was time spent personally by me on the following activities: development of treatment plan with patient and/or surrogate as well as nursing, discussions with consultants, evaluation of patient's response to treatment, examination of patient, obtaining history from patient or surrogate, ordering and performing treatments and interventions, ordering and review of laboratory studies, ordering and review of radiographic studies, pulse oximetry and re-evaluation of patient's condition.  RADIOLOGY Images were viewed by me  Chest x-ray IMPRESSION: No  acute abnormality.  Chronic mild cardiomegaly. ____________________________________________   DIFFERENTIAL DIAGNOSIS   CHF, atrial fibrillation, pneumonia, COPD, coronavirus  FINAL ASSESSMENT AND PLAN  Dyspnea, vomiting, cystitis   Plan: The patient had presented for dyspnea and vomiting.  Patient initially was found to be in rapid atrial fibrillation which is likely from not being able to keep down his metoprolol.  He was given IV and oral metoprolol.  Heart rate did reach 160s.  Patient's labs did not reveal any significant acute abnormality. Patient's imaging was negative for acute process as well.  We have kept him on 2 L of nasal cannula oxygen.  He has received IV antiemetics and we have tested for coronavirus.  Patient states he does not feel well enough to go home, I will discuss with the hospitalist for  admission.   Ulice Dash, MD    Note: This note was generated in part or whole with voice recognition software. Voice recognition is usually quite accurate but there are transcription errors that can and very often do occur. I apologize for any typographical errors that were not detected and corrected.     Emily Filbert, MD 05/29/18 1012

## 2018-05-29 NOTE — ED Triage Notes (Signed)
Pt from home with c/o SOB, vomiting, dizziness.  Pt arrives on non rebreather, per EMS pt was 88% on RA. PT hx of COPD. Pt vomiting upon arrival. + cough and fever. MD at bedside

## 2018-05-29 NOTE — Progress Notes (Signed)
  Dr Katheren Shams notified of negative SARS-Corona 2 Virus results, orders to d/c isolation given, Pt. Medications delivered to pharmacy by this RN.

## 2018-05-29 NOTE — Progress Notes (Signed)
Pt arrived on the unit from the ED at 1422. Pt was A&Ox4. VSS. Pt arrived with clothes, cell phone, charger, and home meds. Home medication sent to the pharmacy. Pt c/o 7/10 chest pain. PRN pain medication given. Pt informed this nurse that his wife recently passed away at home on 06/14/2018. Pt has been having trouble getting places and getting his medication since she passed because she used to do that for him. Pt had wounds to his BLE. Wound care done per wound care orders. Orders reviewed, acknowledged, and initiated. Care plan and education initiated. Pt oriented to room and how to call for help. Pt instructed to not get OOB/chair without assistance. Pt verbalized understanding of instructions. Fall risk bracelet and socks applied.

## 2018-05-29 NOTE — ED Notes (Signed)
Lab called to see whether or not they can add-on flu panel to COVID collection. Will await return call

## 2018-05-29 NOTE — ED Notes (Signed)
RN waiting to give report to 2A. 2A nurse reports she is waiting for charge nurse to approve patient for room

## 2018-05-29 NOTE — Consult Note (Addendum)
Cardiology Consultation:   This visit type was conducted due to national recommendations for restrictions regarding the COVID-19 Pandemic (e.g. social distancing) in an effort to limit this patient's and healthcare provider's exposure and mitigate transmission in our community.  Due to his co-morbid illnesses, this patient is at least at moderate risk for complications without adequate follow up.  This format is felt to be most appropriate for this patient at this time.  The patient did not have access to video technology/had technical difficulties with video requiring transitioning to audio format only (telephone).  All issues noted in this document were discussed and addressed.  No physical exam could be performed with this format.  Please refer to the patient's chart for his  consent to telehealth for [facility].   Patient ID: [name] [name] MRN: [id number]; DOB: [birth year]  Admit date: 05/29/2018 Date of Consult: 05/29/2018  Primary Care Provider: Patient, No Pcp Per Primary Cardiologist: Yvonne Kendallhristopher Khianna Blazina, MD Primary Electrophysiologist:  None   [facility]  Provider Location: Office  Patient Profile:   [name] [name] is a 58 y.o. male with a hx of of chronic systolic heart failure, persistent atrial fibrillation s/p cardioversion (03/2018), COPD, obstructive sleep apnea, and morbid obesity, who is being seen today for the evaluation of atrial fibrillation with rapid ventricular response at the request of Dr. Katheren ShamsSalary.  History of Present Illness:   [name] [facility]03/25/2018 for cellulitiscomplicated by acute on chronic systolic heart failure and atrial fibrillation with rapid ventricular response.  Due to continued suboptimal heart rate control with minimal activity, he underwent cardioversion on 03/24/2018 with successful restoration of sinus rhythm.  He was discharged home on metoprolol succinate 50 mg BID, rivaroxaban 20  mg daily, torsemide 100 mg daily, Entresto 24-26 mg BID (though he now has a prescription for lisinopril 2.5 mg daily instead), and spironolactone 25 mg daily.  He was scheduled for follow-up in our office and the heart failure clinic but did not keep either appointment.  [facility] complaining of shortness of breath, vomiting, fevers, and chills that he woke up with this morning around 4 AM.  He also notes chest pain, which has been intermittently present since leaving the hospital in February.  He also reports having continued palpitations with minimal activity following his cardioversion.  Today, the palpitations seem particularly bad, even when he is lying still.  [name] notes shortness of breath at rest that has gradually developed over the last few days, accompanied by chest congestion.  [name] reports being compliant with some medications, including metoprolol and rivaroxaban.  He did not get at least one medication filled after his recent hospital discharge (? Entresto and torsemide).  He notes chronic leg swelling, which is stable.  His weight has also been stable.  He denies orthopnea and has been using his CPAP regularly.  [name] does not believe that he has been exposed to coronavirus, as he has not left his home for ~1 month.  However, his live-in girlfriend died earlier this month after falling and striking her head.  She fell several times leading up to her death and was seen by EMS as recently as the night before her death.  Past Medical History:  Diagnosis Date  . A-fib (HCC)   . Asthma   . CHF (congestive heart failure) (HCC)   . COPD (chronic obstructive pulmonary disease) (HCC)   . Sleep apnea  Past Surgical History:  Procedure Laterality Date  . CARDIOVERSION N/A 03/24/2018   Procedure: CARDIOVERSION;  Surgeon: Antonieta Iba, MD;  Location: [facility] ORS;  Service: Cardiovascular;  Laterality: N/A;  . hearnia repair    . LEG SURGERY        Home Medications:  Prior to Admission medications   Medication Sig Start Date [place] [employer] Date Taking? Authorizing Provider  albuterol (PROVENTIL HFA;VENTOLIN HFA) 108 (90 Base) MCG/ACT inhaler Inhale 2 puffs into the lungs every 6 (six) hours as needed for wheezing or shortness of breath. 01/29/18  Yes Sudini, Wardell Heath, MD  lisinopril (PRINIVIL,ZESTRIL) 5 MG tablet Take 2.5 mg by mouth daily.   Yes [provider]  metoprolol succinate (TOPROL-XL) 50 MG 24 hr tablet Take 1 tablet (50 mg total) by mouth 2 (two) times daily. Take with or immediately following a meal. 03/25/18  Yes Auburn Bilberry, MD  nitroGLYCERIN (NITROSTAT) 0.4 MG SL tablet Place 1 tablet (0.4 mg total) under the tongue every 5 (five) minutes as needed for chest pain. 01/18/18  Yes Auburn Bilberry, MD  potassium chloride SA (K-DUR,KLOR-CON) 20 MEQ tablet Take 1 tablet (20 mEq total) by mouth 2 (two) times daily. 03/25/18  Yes Auburn Bilberry, MD  rivaroxaban (XARELTO) 20 MG TABS tablet Take 1 tablet (20 mg total) by mouth daily. Patient taking differently: Take 20 mg by mouth every evening.  01/18/18  Yes Auburn Bilberry, MD  spironolactone (ALDACTONE) 25 MG tablet Take 1 tablet (25 mg total) by mouth daily. 03/25/18  Yes Auburn Bilberry, MD  torsemide (DEMADEX) 100 MG tablet Take 1 tablet (100 mg total) by mouth daily. 03/25/18  Yes Auburn Bilberry, MD  traZODone (DESYREL) 150 MG tablet Take 1 tablet (150 mg total) by mouth at bedtime as needed for sleep. 01/18/18  Yes Auburn Bilberry, MD    Inpatient Medications: Scheduled Meds: . potassium chloride  60 mEq Oral Once   Continuous Infusions: . ondansetron (ZOFRAN) IV     PRN Meds:   Allergies:   No Known Allergies  Social History:   Social History   Tobacco Use  . Smoking status: Never Smoker  . Smokeless tobacco: Never Used  Substance Use Topics  . Alcohol use: Never    Frequency: Never  . Drug use: Never     Family History:   Family History  Problem Relation  Age of Onset  . Heart failure Mother   . Lung cancer Mother   . Lung cancer Father      ROS:  Please see the history of present illness. All other ROS reviewed and negative.     Physical Exam/Data:   Vitals:   05/29/18 1200 05/29/18 1230 05/29/18 1300 05/29/18 1330  BP: 128/69 127/67 121/84 114/73  Pulse: (!) 53 79 63 (!) 131  Resp: (!) 22 18 18  (!) 24  Temp:      TempSrc:      SpO2: 99% 100% 99% 99%  Weight:       No intake or output data in the 24 hours ending 05/29/18 1413 Last 3 Weights 05/29/2018 03/25/2018 03/24/2018  Weight (lbs) 360 lb 382 lb 12.8 oz 376 lb 15.8 oz  Weight (kg) 163.295 kg 173.637 kg 171 kg     Body mass index is 54.74 kg/m.   Unable to perform due to telephone visit.  EKG:  The EKG was personally reviewed and demonstrates:  Atrial fibrillation with rapid ventricular response and LBBB. Telemetry:  Telemetry was personally reviewed and demonstrates:  Atrial fibrillation  with rapid ventricular response.  Relevant CV Studies: Echo (03/24/2018): Technically difficult study. Mildly dilated LV with LVEF 25-30%.  Moderate LAE.  Laboratory Data:  Chemistry Recent Labs  Lab 05/29/18 0748  NA 137  K 2.9*  CL 103  CO2 23  GLUCOSE 144*  BUN 6  CREATININE 0.84  CALCIUM 8.1*  GFRNONAA >60  GFRAA >60  ANIONGAP 11    Recent Labs  Lab 05/29/18 0748  PROT 7.3  ALBUMIN 3.2*  AST 23  ALT 12  ALKPHOS 85  BILITOT 0.8   Hematology Recent Labs  Lab 05/29/18 0748  WBC 5.0  RBC 4.26  HGB 10.8*  HCT 35.0*  MCV 82.2  MCH 25.4*  MCHC 30.9  RDW 17.8*  PLT 194   Cardiac EnzymesNo results for input(s): TROPONINI in the last 168 hours. No results for input(s): TROPIPOC in the last 168 hours.  BNP Recent Labs  Lab 05/29/18 0748  BNP 289.0*    DDimer No results for input(s): DDIMER in the last 168 hours.  Radiology/Studies:  Dg Chest Port 1 View  Result Date: 05/29/2018 CLINICAL DATA:  Dyspnea.  COPD. EXAM: PORTABLE CHEST 1 VIEW COMPARISON:   03/18/2018 FINDINGS: There is chronic borderline cardiomegaly. Pulmonary vascularity is at the upper limits of normal. No infiltrates or effusions. No significant bone abnormality. IMPRESSION: No acute abnormality.  Chronic mild cardiomegaly. Electronically Signed   By: Francene Boyers M.D.   On: 05/29/2018 08:39    Assessment and Plan:   Atrial fibrillation with rapid ventricular response: Patient has a history of persistent atrial fibrillation with recent DCCV.  Unfortunately, he has not maintained sinus rhythm.  A-fib could have been precipitated by recent infectious process, though Mr. Pileggi reports some symptoms over the preceding few weeks that could have indicated much earlier recurrence.  Unfortunately, he did not keep any of his follow-up visits with [place] or the heart failure clinic.  Heart rate remains suboptimally controlled at this time.  He reports being compliant with metoprolol and rivaroxban at home.  Continue metoprolol succinate 50 mg daily; may want to consider switching to BID hour dosing for more sustained response if heart rate remains elevated.  Continue rivaroxaban 20 mg daily.  Start amiodarone 400 mg BID to complete 10 gram load.  If patient remains in a-fib and acute infection process has cleared, cardioversion may need to be considered after receiving at least a few days of amiodarone.  Chronic systolic heart failure: Patient reports stable weight and swelling but recent increase in shortness of breath.  Dyspnea is likely multifactorial, including a-fib with RVR, possible acute respiratory infection, and morbid obesity superimposed on chronic systolic heart failure.  Weight is down 22 pounds since discharge in February, though given report of stable weight at home, I question accuracy of documented weights.  BNP is elevated but not significantly above prior values.  It sounds like the patient may have still been taking furosemide at home, rather than the torsemide 100 mg  daily that was prescribed at last discharge.  Maintain net even fluid balance.  Okay to give torsemide 100 mg PO x 1 to assess response and titrate as needed.  Continue beta-blockade, as outlined above.  Continue lisinopril 2.5 mg daily.  Chest pain Chronic with report of at least 2 clean catheterizations in the past.  Pain seemed to improve following cardioversion; may be related to atrial fibrillation.  Acute respiratory infection could certainly be complicating chest pain.  Continue supportive care and management of a-fib,  as above.  No plans for ischemia evaluation at this time.  Further w/u and management of possible respiratory infection, per IM.  Respiratory infection Though patient denies sick contacts or leaving his home, his symptoms are certainly concerning for respiratory infection, including COVID-19, based on subjective fever, cough, and worsening shortness of breath.  COVID-19 test pending.  Maintain isolation precautions.  Supportive care per internal medicine.   For questions or updates, please contact [facility] Please consult [email or web address] for contact info under [facility] Cardiology.  A total of 25 minutes were spent performing telehealth with the patient.  Signed, [name], MD  05/29/2018 2:13 PM

## 2018-05-29 NOTE — Consult Note (Signed)
WOC Nurse wound consult note Reason for Consult:Chronic edema to bilateral lower legs.  Increased edema and blistering left lower leg over one month ago, resolving. Continues with edema, bilateral erythema and chronic skin changes.  Wound type: Venous insufficiency Pressure Injury POA: NA Measurement: Left leg resolving 3 cm round blistering lesion Chronic skin changes to bilateral lower legs Wound SVX:BLTJ and moist Drainage (amount, consistency, odor) minimal serous weeping Periwound:edema, erythema and chronic skin changes.  Dressing procedure/placement/frequency: Cleanse bilateral lower legs with soap and water and pat dry.  Apply Xeroform gauze to nonintact, weeping lesions to legs.  Wrap with kerlix and secure with ace wrap from below the toes to below the knee.  Change Mon/Wed/Fri.  Will not follow at this time.  Please re-consult if needed.  Maple Hudson MSN, RN, FNP-BC CWON Wound, Ostomy, Continence Nurse Pager [phone]

## 2018-05-29 NOTE — ED Notes (Signed)
ED TO INPATIENT HANDOFF REPORT  ED Nurse Name and Phone #: Jae DireKate [phone]  S Name/Age/Gender [name] MajesticKenneth [name] 58 y.o. male Room/Bed: ED01A/ED01A  Code Status   Code Status: Prior  Home/SNF/Other Home Patient oriented to: self, place, time and situation Is this baseline? Yes   Triage Complete: Triage complete  Chief Complaint sob  Triage Note Pt from home with c/o SOB, vomiting, dizziness.  Pt arrives on non rebreather, per EMS pt was 88% on RA. PT hx of COPD. Pt vomiting upon arrival. + cough and fever. MD at bedside    Allergies No Known Allergies  Level of Care/Admitting Diagnosis ED Disposition    ED Disposition Condition Comment   Admit  Hospital Area: [facility] [[id number]]  Level of Care: Telemetry [5]  Diagnosis: Respiratory failure [facility](HCC) [[id number]]  Admitting Physician: Bertrum SolSALARY, MONTELL D [[id number]]  Attending Physician: Bertrum SolSALARY, MONTELL D [[id number]]  Estimated length of stay: past midnight tomorrow  Certification:: I certify this patient will need inpatient services for at least 2 midnights  Bed request comments: 2a  PT Class (Do Not Modify): Inpatient [101]  PT Acc Code (Do Not Modify): Private [1]       B Medical/Surgery History Past Medical History:  Diagnosis Date  . A-fib (HCC)   . Asthma   . CHF (congestive heart failure) (HCC)   . COPD (chronic obstructive pulmonary disease) (HCC)   . Sleep apnea    Past Surgical History:  Procedure Laterality Date  . CARDIOVERSION N/A 03/24/2018   Procedure: CARDIOVERSION;  Surgeon: Antonieta IbaGollan, Timothy J, MD;  Location: [facility] ORS;  Service: Cardiovascular;  Laterality: N/A;  . hearnia repair    . LEG SURGERY       A IV Location/Drains/Wounds Patient Lines/Drains/Airways Status   Active Line/Drains/Airways    Name:   Placement date:   Placement time:   Site:   Days:   Peripheral IV 05/29/18 Right Forearm   05/29/18    0754    Forearm   less than 1   Peripheral IV 05/29/18 Right Hand   05/29/18     0754    Hand   less than 1   Wound / Incision (Open or Dehisced) 01/17/18 Other (Comment) Tibial Left;Posterior scab that was open bleeding   01/17/18    0131    Tibial   132   Wound / Incision (Open or Dehisced) 03/17/18 Venous stasis ulcer Ankle Left;Lateral Pink shallow wound bed   03/17/18    0129    Ankle   73          Intake/Output Last 24 hours No intake or output data in the 24 hours ending 05/29/18 1249  Labs/Imaging Results for orders placed or performed during the hospital encounter of 05/29/18 (from the past 48 hour(s))  CBC with Differential     Status: Abnormal   Collection Time: 05/29/18  7:48 AM  Result Value Ref Range   WBC 5.0 4.0 - 10.5 K/uL   RBC 4.26 4.22 - 5.81 MIL/uL   Hemoglobin 10.8 (L) 13.0 - 17.0 g/dL   HCT 08.635.0 (L) 57.839.0 - 46.952.0 %   MCV 82.2 80.0 - 100.0 fL   MCH 25.4 (L) 26.0 - 34.0 pg   MCHC 30.9 30.0 - 36.0 g/dL   RDW 62.917.8 (H) 52.811.5 - 41.315.5 %   Platelets 194 150 - 400 K/uL   nRBC 0.0 0.0 - 0.2 %   Neutrophils Relative % 71 %   Neutro Abs 3.6 1.7 - 7.7  K/uL   Lymphocytes Relative 16 %   Lymphs Abs 0.8 0.7 - 4.0 K/uL   Monocytes Relative 8 %   Monocytes Absolute 0.4 0.1 - 1.0 K/uL   Eosinophils Relative 3 %   Eosinophils Absolute 0.1 0.0 - 0.5 K/uL   Basophils Relative 1 %   Basophils Absolute 0.1 0.0 - 0.1 K/uL   Immature Granulocytes 1 %   Abs Immature Granulocytes 0.03 0.00 - 0.07 K/uL    Comment: Performed at [facility], [address] [place], [place] [address]  Brain natriuretic peptide     Status: Abnormal   Collection Time: 05/29/18  7:48 AM  Result Value Ref Range   B Natriuretic Peptide 289.0 (H) 0.0 - 100.0 pg/mL    Comment: Performed at [facility], [address] [place], [place] [address]  Comprehensive metabolic panel     Status: Abnormal   Collection Time: 05/29/18  7:48 AM  Result Value Ref Range   Sodium 137 135 - 145 mmol/L   Potassium 2.9 (L) 3.5 - 5.1 mmol/L   Chloride 103 98 - 111 mmol/L   CO2  23 22 - 32 mmol/L   Glucose, Bld 144 (H) 70 - 99 mg/dL   BUN 6 6 - 20 mg/dL   Creatinine, Ser 0.86 0.61 - 1.24 mg/dL   Calcium 8.1 (L) 8.9 - 10.3 mg/dL   Total Protein 7.3 6.5 - 8.1 g/dL   Albumin 3.2 (L) 3.5 - 5.0 g/dL   AST 23 15 - 41 U/L   ALT 12 0 - 44 U/L   Alkaline Phosphatase 85 38 - 126 U/L   Total Bilirubin 0.8 0.3 - 1.2 mg/dL   GFR calc non Af Amer >60 >60 mL/min   GFR calc Af Amer >60 >60 mL/min   Anion gap 11 5 - 15    Comment: Performed at [facility], [address] [place], [place] [address]  SARS Coronavirus 2 [facility] order, Performed in [facility] hospital lab)     Status: None   Collection Time: 05/29/18  7:48 AM  Result Value Ref Range   SARS Coronavirus 2 NEGATIVE NEGATIVE    Comment: (NOTE) If result is NEGATIVE SARS-CoV-2 target nucleic acids are NOT DETECTED. The SARS-CoV-2 RNA is generally detectable in upper and lower  respiratory specimens during the acute phase of infection. The lowest  concentration of SARS-CoV-2 viral copies this assay can detect is 250  copies / mL. A negative result does not preclude SARS-CoV-2 infection  and should not be used as the sole basis for treatment or other  patient management decisions.  A negative result may occur with  improper specimen collection / handling, submission of specimen other  than nasopharyngeal swab, presence of viral mutation(s) within the  areas targeted by this assay, and inadequate number of viral copies  (<250 copies / mL). A negative result must be combined with clinical  observations, patient history, and epidemiological information. If result is POSITIVE SARS-CoV-2 target nucleic acids are DETECTED. The SARS-CoV-2 RNA is generally detectable in upper and lower  respiratory specimens dur ing the acute phase of infection.  Positive  results are indicative of active infection with SARS-CoV-2.  Clinical  correlation with patient history and other diagnostic information is  necessary  to determine patient infection status.  Positive results do  not rule out bacterial infection or co-infection with other viruses. If result is PRESUMPTIVE POSTIVE SARS-CoV-2 nucleic acids MAY BE PRESENT.   A presumptive positive result  was obtained on the submitted specimen  and confirmed on repeat testing.  While 2019 novel coronavirus  (SARS-CoV-2) nucleic acids may be present in the submitted sample  additional confirmatory testing may be necessary for epidemiological  and / or clinical management purposes  to differentiate between  SARS-CoV-2 and other Sarbecovirus currently known to infect humans.  If clinically indicated additional testing with an alternate test  methodology [phone]) is advised. The SARS-CoV-2 RNA is generally  detectable in upper and lower respiratory sp ecimens during the acute  phase of infection. The expected result is Negative. Fact Sheet for Patients:  BoilerBrush.com.cy Fact Sheet for Healthcare Providers: https://pope.com/ This test is not yet approved or cleared by the [place] FDA and has been authorized for detection and/or diagnosis of SARS-CoV-2 by FDA under an Emergency Use Authorization (EUA).  This EUA will remain in effect (meaning this test can be used) for the duration of the COVID-19 declaration under Section 564(b)(1) of the Act, 21 U.S.C. section 360bbb-3(b)(1), unless the authorization is terminated or revoked sooner. Performed at [facility], [address], [place], [place] [address]   Influenza panel by PCR (type A & B)     Status: None   Collection Time: 05/29/18  7:48 AM  Result Value Ref Range   Influenza A By PCR NEGATIVE NEGATIVE   Influenza B By PCR NEGATIVE NEGATIVE    Comment: (NOTE) The Xpert Xpress Flu assay is intended as an aid in the diagnosis of  influenza and should not be used as a sole basis for treatment.  This  assay is FDA approved for nasopharyngeal  swab specimens only. Nasal  washings and aspirates are unacceptable for Xpert Xpress Flu testing. Performed at [facility], [address] [place], [place] [address]   Urinalysis, Complete w Microscopic     Status: Abnormal   Collection Time: 05/29/18  8:15 AM  Result Value Ref Range   Color, Urine YELLOW (A) YELLOW   APPearance HAZY (A) CLEAR   Specific Gravity, Urine 1.019 1.005 - 1.030   pH 6.0 5.0 - 8.0   Glucose, UA NEGATIVE NEGATIVE mg/dL   Hgb urine dipstick NEGATIVE NEGATIVE   Bilirubin Urine NEGATIVE NEGATIVE   Ketones, ur NEGATIVE NEGATIVE mg/dL   Protein, ur 30 (A) NEGATIVE mg/dL   Nitrite NEGATIVE NEGATIVE   Leukocytes,Ua MODERATE (A) NEGATIVE   RBC / HPF 11-20 0 - 5 RBC/hpf   WBC, UA 21-50 0 - 5 WBC/hpf   Bacteria, UA NONE SEEN NONE SEEN   Squamous Epithelial / LPF 0-5 0 - 5   Mucus PRESENT     Comment: Performed at [facility], [address] [place], [place] [address]  Blood gas, venous     Status: Abnormal   Collection Time: 05/29/18  8:25 AM  Result Value Ref Range   pH, Ven 7.47 (H) 7.250 - 7.430   pCO2, Ven 36 (L) 44.0 - 60.0 mmHg   pO2, Ven 67.0 (H) 32.0 - 45.0 mmHg   Bicarbonate 26.2 20.0 - 28.0 mmol/L   Acid-Base Excess 2.7 (H) 0.0 - 2.0 mmol/L   O2 Saturation 94.2 %   Patient temperature 37.0    Collection site VEIN    Sample type VEIN     Comment: Performed at [facility], [address], [place], [place] [address]   Dg Chest Port 1 View  Result Date: 05/29/2018 CLINICAL DATA:  Dyspnea.  COPD. EXAM: PORTABLE CHEST 1 VIEW COMPARISON:  03/18/2018 FINDINGS: There is chronic borderline cardiomegaly. Pulmonary vascularity is at the upper limits of normal. No infiltrates or effusions. No significant bone abnormality. IMPRESSION: No acute abnormality.  Chronic mild cardiomegaly. Electronically Signed   By: Francene Boyers M.D.   On: 05/29/2018 08:39    Pending Labs Unresulted Labs (From admission, onward)    Start      Ordered   05/29/18 1221  Magnesium  Once,   STAT     05/29/18 1220   05/29/18 1122  Respiratory Panel by PCR  (Respiratory virus panel with precautions)  Once,   STAT     05/29/18 1121   Signed and Held  TSH  Once,   R     Signed and Held   Signed and Held  Basic metabolic panel  Tomorrow morning,   R     Signed and Held          Vitals/Pain Today's Vitals   05/29/18 1100 05/29/18 1130 05/29/18 1200 05/29/18 1230  BP: (!) 120/50 (!) 122/95 128/69 127/67  Pulse: 76 84 (!) 53 79  Resp: (!) 24 (!) 22 (!) 22 18  Temp:      TempSrc:      SpO2: 99% 97% 99% 100%  Weight:      PainSc:        Isolation Precautions Droplet precaution  Medications Medications  ondansetron (ZOFRAN) 8 mg in sodium chloride 0.9 % 50 mL IVPB (8 mg Intravenous Not Given 05/29/18 0807)  potassium chloride (KLOR-CON) packet 60 mEq (has no administration in time range)  metoprolol tartrate (LOPRESSOR) injection 5 mg (5 mg Intravenous Given 05/29/18 0801)  metoprolol tartrate (LOPRESSOR) tablet 50 mg (50 mg Oral Given 05/29/18 0809)  ondansetron (ZOFRAN) 4 MG/2ML injection (8 mg  Given 05/29/18 0758)    Mobility non-ambulatory Low fall risk   Focused Assessments Pulmonary Assessment Handoff:  Lung sounds:   O2 Device: Room Air        R Recommendations: See Admitting Provider Note  Report given to:   Additional Notes: covid -

## 2018-05-29 NOTE — ED Notes (Signed)
Pt comfortable at this time, NAD noted.

## 2018-05-29 NOTE — H&P (Signed)
[employer] [facility] [place] at [facility]   PATIENT NAME: [name] [name]    MR#:  [id number]  DATE OF BIRTH:  1960/08/06  DATE OF ADMISSION:  05/29/2018  PRIMARY CARE PHYSICIAN: Patient, No Pcp Per   REQUESTING/REFERRING PHYSICIAN:   CHIEF COMPLAINT:   Chief Complaint  Patient presents with  . Shortness of Breath    HISTORY OF PRESENT ILLNESS: [name] [name]  is a 58 y.o. male with a known history of per below presents emergency room with 3-day history of watery eyes, nonproductive cough, fever, nausea, emesis, ill feeling, denies sick contacts, in emergency room patient was noted to have A. fib with RVR with heart rate in the 150s-treated with metoprolol, suspected due to inability to tolerate p.o. medications while at home, potassium was 2.9, urinalysis noted for moderate leukocyte esterase, chest x-ray noted for cardiomegaly, influenza test negative, respiratory panel/coronavirus testing sent, hospitalist asked to admit, patient evaluated in the emergency room, in no apparent distress, resting comfortably in bed, patient is now being admitted for acute hypoxic respiratory failure most likely secondary to acute A. fib with RVR and probable acute viral upper respiratory tract infection, concern for coronavirus infection.  PAST MEDICAL HISTORY:   Past Medical History:  Diagnosis Date  . A-fib (HCC)   . Asthma   . CHF (congestive heart failure) (HCC)   . COPD (chronic obstructive pulmonary disease) (HCC)   . Sleep apnea     PAST SURGICAL HISTORY:  Past Surgical History:  Procedure Laterality Date  . CARDIOVERSION N/A 03/24/2018   Procedure: CARDIOVERSION;  Surgeon: Antonieta Iba, MD;  Location: [facility] ORS;  Service: Cardiovascular;  Laterality: N/A;  . hearnia repair    . LEG SURGERY      SOCIAL HISTORY:  Social History   Tobacco Use  . Smoking status: Never Smoker  . Smokeless tobacco: Never Used  Substance Use Topics  . Alcohol use: Never    Frequency:  Never    FAMILY HISTORY:  Family History  Problem Relation Age of Onset  . Heart failure Mother   . Lung cancer Mother   . Lung cancer Father     DRUG ALLERGIES: No Known Allergies  REVIEW OF SYSTEMS:   CONSTITUTIONAL: + fever, fatigue, weakness.  EYES: No blurred or double vision. + Watery eyes EARS, NOSE, AND THROAT: No tinnitus or ear pain.  RESPIRATORY: + cough, shortness of breath,no wheezing or hemoptysis.  CARDIOVASCULAR: No chest pain, orthopnea, edema.  GASTROINTESTINAL: + nausea, vomiting, no diarrhea or abdominal pain.  GENITOURINARY: No dysuria, hematuria.  ENDOCRINE: No polyuria, nocturia,  HEMATOLOGY: No anemia, easy bruising or bleeding SKIN: No rash or lesion. MUSCULOSKELETAL: No joint pain or arthritis.   NEUROLOGIC: No tingling, numbness, weakness.  PSYCHIATRY: No anxiety or depression.   MEDICATIONS AT HOME:  Prior to Admission medications   Medication Sig Start Date End Date Taking? Authorizing Provider  albuterol (PROVENTIL HFA;VENTOLIN HFA) 108 (90 Base) MCG/ACT inhaler Inhale 2 puffs into the lungs every 6 (six) hours as needed for wheezing or shortness of breath. 01/29/18  Yes Sudini, Wardell Heath, MD  lisinopril (PRINIVIL,ZESTRIL) 5 MG tablet Take 2.5 mg by mouth daily.   Yes [provider]  metoprolol succinate (TOPROL-XL) 50 MG 24 hr tablet Take 1 tablet (50 mg total) by mouth 2 (two) times daily. Take with or immediately following a meal. 03/25/18  Yes Auburn Bilberry, MD  nitroGLYCERIN (NITROSTAT) 0.4 MG SL tablet Place 1 tablet (0.4 mg total) under the tongue every 5 (  five) minutes as needed for chest pain. 01/18/18  Yes Auburn BilberryPatel, Shreyang, MD  potassium chloride SA (K-DUR,KLOR-CON) 20 MEQ tablet Take 1 tablet (20 mEq total) by mouth 2 (two) times daily. 03/25/18  Yes Auburn BilberryPatel, Shreyang, MD  rivaroxaban (XARELTO) 20 MG TABS tablet Take 1 tablet (20 mg total) by mouth daily. Patient taking differently: Take 20 mg by mouth every evening.  01/18/18  Yes Auburn BilberryPatel,  Shreyang, MD  spironolactone (ALDACTONE) 25 MG tablet Take 1 tablet (25 mg total) by mouth daily. 03/25/18  Yes Auburn BilberryPatel, Shreyang, MD  torsemide (DEMADEX) 100 MG tablet Take 1 tablet (100 mg total) by mouth daily. 03/25/18  Yes Auburn BilberryPatel, Shreyang, MD  traZODone (DESYREL) 150 MG tablet Take 1 tablet (150 mg total) by mouth at bedtime as needed for sleep. 01/18/18  Yes Auburn BilberryPatel, Shreyang, MD      PHYSICAL EXAMINATION:   VITAL SIGNS: Blood pressure (!) 122/95, pulse 84, temperature 99.3 F (37.4 C), temperature source Oral, resp. rate (!) 22, weight (!) 163.3 kg, SpO2 97 %.  GENERAL:  58 y.o.-year-old patient lying in the bed with no acute distress.  Extreme morbid obesity, nontoxic-appearing EYES: Pupils equal, round, reactive to light and accommodation. No scleral icterus. Extraocular muscles intact.  HEENT: Head atraumatic, normocephalic. Oropharynx and nasopharynx clear.  NECK:  Supple, no jugular venous distention. No thyroid enlargement, no tenderness.  LUNGS: Severely diminished breath sounds throughout. No use of accessory muscles of respiration.  CARDIOVASCULAR: S1, S2 normal. No murmurs, rubs, or gallops.  ABDOMEN: Soft, nontender, nondistended. Bowel sounds present. No organomegaly or mass.  EXTREMITIES: Bilateral chronic lower extremity lymphedema   NEUROLOGIC: Cranial nerves II through XII are intact. Muscle strength 5/5 in all extremities. Sensation intact. Gait not checked.  PSYCHIATRIC: The patient is alert and oriented x 3.  SKIN: Chronic bilateral lower extremity leg ulcerations due to chronic lymphedema   LABORATORY PANEL:   CBC Recent Labs  Lab 05/29/18 0748  WBC 5.0  HGB 10.8*  HCT 35.0*  PLT 194  MCV 82.2  MCH 25.4*  MCHC 30.9  RDW 17.8*  LYMPHSABS 0.8  MONOABS 0.4  EOSABS 0.1  BASOSABS 0.1   ------------------------------------------------------------------------------------------------------------------  Chemistries  Recent Labs  Lab 05/29/18 0748  NA 137  K  2.9*  CL 103  CO2 23  GLUCOSE 144*  BUN 6  CREATININE 0.84  CALCIUM 8.1*  AST 23  ALT 12  ALKPHOS 85  BILITOT 0.8   ------------------------------------------------------------------------------------------------------------------ estimated creatinine clearance is 146 mL/min (by C-G formula based on SCr of 0.84 mg/dL). ------------------------------------------------------------------------------------------------------------------ No results for input(s): TSH, T4TOTAL, T3FREE, THYROIDAB in the last 72 hours.  Invalid input(s): FREET3   Coagulation profile No results for input(s): INR, PROTIME in the last 168 hours. ------------------------------------------------------------------------------------------------------------------- No results for input(s): DDIMER in the last 72 hours. -------------------------------------------------------------------------------------------------------------------  Cardiac Enzymes No results for input(s): CKMB, TROPONINI, MYOGLOBIN in the last 168 hours.  Invalid input(s): CK ------------------------------------------------------------------------------------------------------------------ Invalid input(s): POCBNP  ---------------------------------------------------------------------------------------------------------------  Urinalysis    Component Value Date/Time   COLORURINE YELLOW (A) 05/29/2018 0815   APPEARANCEUR HAZY (A) 05/29/2018 0815   LABSPEC 1.019 05/29/2018 0815   PHURINE 6.0 05/29/2018 0815   GLUCOSEU NEGATIVE 05/29/2018 0815   HGBUR NEGATIVE 05/29/2018 0815   BILIRUBINUR NEGATIVE 05/29/2018 0815   KETONESUR NEGATIVE 05/29/2018 0815   PROTEINUR 30 (A) 05/29/2018 0815   NITRITE NEGATIVE 05/29/2018 0815   LEUKOCYTESUR MODERATE (A) 05/29/2018 0815     RADIOLOGY: Dg Chest Port 1 View  Result Date: 05/29/2018 CLINICAL DATA:  Dyspnea.  COPD. EXAM: PORTABLE CHEST 1 VIEW COMPARISON:  03/18/2018 FINDINGS: There is chronic  borderline cardiomegaly. Pulmonary vascularity is at the upper limits of normal. No infiltrates or effusions. No significant bone abnormality. IMPRESSION: No acute abnormality.  Chronic mild cardiomegaly. Electronically Signed   By: Francene Boyers M.D.   On: 05/29/2018 08:39    EKG: Orders placed or performed during the hospital encounter of 05/29/18  . EKG 12-Lead  . EKG 12-Lead  . ED EKG  . ED EKG    IMPRESSION AND PLAN: *Acute hypoxic respiratory failure Most likely secondary to acute on chronic A. fib with RVR, extreme morbid obesity, obstructive sleep apnea, and probable acute viral upper respiratory tract infection Supplemental oxygen with weaning as tolerated  *Acute A. fib with RVR Most likely exacerbated by viral upper respiratory tract infection and inability to tolerate rate control agents Admit to telemetry bed, cycle cardiac enzymes, cardiology to see, continue Toprol-XL, IV Lopressor PRN with hold parameters, on Xarelto  *Acute probable viral respiratory tract infection with nausea, emesis, rhinorrhea, nonproductive cough Concern for coronavirus infection Influenza negative, follow-up on respiratory panel and coronavirus testing Supplemental oxygen with supportive care for now, droplet/contact precautions  *Chronic systolic congestive heart failure without exacerbation with cardiomyopathy Ejection fraction 25-30% on most recent echocardiogram from February 2020 Stable Continue lisinopril, torsemide, on Xarelto, strict I&O monitoring, daily weights, spironolactone, cardiology to see  *Chronic obstructive sleep apnea CPAP at bedtime/as needed  *COPD/asthma without exacerbation Breathing treatments PRN  *Chronic extreme morbid obesity Most likely secondary to excess calories Lifestyle modification recommended  All the records are reviewed and case discussed with ED provider. Management plans discussed with the patient, family and they are in agreement.  CODE  STATUS:full Code Status History    Date Active Date Inactive Code Status Order ID Comments User Context   03/17/2018 0042 03/25/2018 1554 Full Code [id number]  Altamese Dilling, MD ED   02/24/2018 0725 02/27/2018 1555 Full Code [id number]  Oralia Manis, MD Inpatient   01/27/2018 1345 01/29/2018 1406 Full Code [id number]  Alford Highland, MD ED   01/17/2018 1330 01/19/2018 1818 Partial Code [id number]  Milagros Loll, MD Inpatient   01/16/2018 1427 01/17/2018 1330 Full Code [id number]  Auburn Bilberry, MD ED       TOTAL TIME TAKING CARE OF THIS PATIENT: 40 minutes.    Evelena Asa Salary M.D on 05/29/2018   Between 7am to 6pm - Pager - [phone]  After 6pm go to [email or web address] - password [email or web address]  [facility] [place] [facility]  Office  [phone]  CC: Primary care physician; Patient, No Pcp Per   Note: This dictation was prepared with Dragon dictation along with smaller phrase technology. Any transcriptional errors that result from this process are unintentional.

## 2018-05-29 NOTE — Progress Notes (Signed)
Family Meeting Note  Advance Directive:yes  Today a meeting took place with the Patient.  Patient is able to participate   The following clinical team members were present during this meeting:MD  The following were discussed:Patient's diagnosis:57 y.o. male with Hx per HPI presents emergency room with 3-day history of watery eyes, nonproductive cough, fever, nausea, emesis, ill feeling, denies sick contacts, in emergency room patient was noted to have A. fib with RVR with heart rate in the 150s-treated with metoprolol, suspected due to inability to tolerate p.o. medications while at home, potassium was 2.9, urinalysis noted for moderate leukocyte esterase, chest x-ray noted for cardiomegaly, influenza test negative, respiratory panel/coronavirus testing sent, hospitalist asked to admit, patient evaluated in the emergency room, in no apparent distress, resting comfortably in bed, patient is now being admitted for acute hypoxic respiratory failure most likely secondary to acute A. fib with RVR and probable acute viral upper respiratory tract infection, concern for coronavirus infection. , Patient's progosis: Unable to determine and Goals for treatment: Full Code  Additional follow-up to be provided: prn  Time spent during discussion:20 minutes  Bertrum Sol, MD

## 2018-05-30 ENCOUNTER — Other Ambulatory Visit: Payer: Self-pay

## 2018-05-30 DIAGNOSIS — I4819 Other persistent atrial fibrillation: Principal | ICD-10-CM

## 2018-05-30 DIAGNOSIS — I5022 Chronic systolic (congestive) heart failure: Secondary | ICD-10-CM

## 2018-05-30 DIAGNOSIS — J988 Other specified respiratory disorders: Secondary | ICD-10-CM

## 2018-05-30 LAB — BASIC METABOLIC PANEL
Anion gap: 12 (ref 5–15)
BUN: 10 mg/dL (ref 6–20)
CO2: 29 mmol/L (ref 22–32)
Calcium: 8.5 mg/dL — ABNORMAL LOW (ref 8.9–10.3)
Chloride: 95 mmol/L — ABNORMAL LOW (ref 98–111)
Creatinine, Ser: 0.98 mg/dL (ref 0.61–1.24)
GFR calc Af Amer: >60 mL/min (ref >60)
GFR calc non Af Amer: >60 mL/min (ref >60)
Glucose, Bld: 108 mg/dL — ABNORMAL HIGH (ref 70–99)
Potassium: 3.8 mmol/L (ref 3.5–5.1)
Sodium: 136 mmol/L (ref 135–145)

## 2018-05-30 MED ORDER — TORSEMIDE 20 MG PO TABS
40.0000 mg | ORAL_TABLET | Freq: Every day | ORAL | Status: DC
  Administered 2018-05-30 – 2018-05-31 (×2): 40 mg via ORAL
  Filled 2018-05-30 (×2): qty 2

## 2018-05-30 MED ORDER — MELOXICAM 7.5 MG PO TABS
15.0000 mg | ORAL_TABLET | Freq: Every day | ORAL | Status: DC
  Administered 2018-05-30 – 2018-06-01 (×3): 15 mg via ORAL
  Filled 2018-05-30 (×3): qty 2

## 2018-05-30 MED ORDER — FLUTICASONE PROPIONATE 50 MCG/ACT NA SUSP
2.0000 | Freq: Two times a day (BID) | NASAL | Status: DC
  Administered 2018-05-30 – 2018-06-01 (×5): 2 via NASAL
  Filled 2018-05-30: qty 16

## 2018-05-30 MED ORDER — METOLAZONE 5 MG PO TABS
5.0000 mg | ORAL_TABLET | Freq: Every day | ORAL | Status: DC
  Administered 2018-05-30: 5 mg via ORAL
  Filled 2018-05-30: qty 1

## 2018-05-30 MED ORDER — LORATADINE 10 MG PO TABS
10.0000 mg | ORAL_TABLET | Freq: Every day | ORAL | Status: DC
  Administered 2018-05-30 – 2018-06-01 (×3): 10 mg via ORAL
  Filled 2018-05-30 (×3): qty 1

## 2018-05-30 MED ORDER — BUDESONIDE 0.5 MG/2ML IN SUSP
0.5000 mg | Freq: Two times a day (BID) | RESPIRATORY_TRACT | Status: DC
  Administered 2018-05-30 – 2018-05-31 (×3): 0.5 mg via RESPIRATORY_TRACT
  Filled 2018-05-30 (×4): qty 2

## 2018-05-30 MED ORDER — CYCLOBENZAPRINE HCL 10 MG PO TABS
10.0000 mg | ORAL_TABLET | Freq: Three times a day (TID) | ORAL | Status: DC | PRN
  Administered 2018-05-30: 10 mg via ORAL
  Filled 2018-05-30: qty 1

## 2018-05-30 MED ORDER — COLCHICINE 0.6 MG PO TABS
0.6000 mg | ORAL_TABLET | Freq: Two times a day (BID) | ORAL | Status: DC
  Administered 2018-05-30 – 2018-06-01 (×5): 0.6 mg via ORAL
  Filled 2018-05-30 (×5): qty 1

## 2018-05-30 MED ORDER — GABAPENTIN 300 MG PO CAPS
300.0000 mg | ORAL_CAPSULE | Freq: Three times a day (TID) | ORAL | Status: DC
  Administered 2018-05-30 – 2018-06-01 (×7): 300 mg via ORAL
  Filled 2018-05-30 (×7): qty 1

## 2018-05-30 NOTE — Progress Notes (Signed)
[employer] - [place] at [facility]   PATIENT NAME: [name] [name]    MR#:  [id number]  DATE OF BIRTH:  Jun 10, 1960  SUBJECTIVE:  CHIEF COMPLAINT:   Chief Complaint  Patient presents with  . Shortness of Breath  Complains of postnasal drainage/allergies, complains of diffuse joint pain/denies gout attack, cardiology input appreciated, EKG noted for continued A. fib but is rate controlled  REVIEW OF SYSTEMS:  CONSTITUTIONAL: No fever, fatigue or weakness.  EYES: No blurred or double vision.  EARS, NOSE, AND THROAT: No tinnitus or ear pain.  RESPIRATORY: No cough, shortness of breath, wheezing or hemoptysis.  CARDIOVASCULAR: No chest pain, orthopnea, edema.  GASTROINTESTINAL: No nausea, vomiting, diarrhea or abdominal pain.  GENITOURINARY: No dysuria, hematuria.  ENDOCRINE: No polyuria, nocturia,  HEMATOLOGY: No anemia, easy bruising or bleeding SKIN: No rash or lesion. MUSCULOSKELETAL: No joint pain or arthritis.   NEUROLOGIC: No tingling, numbness, weakness.  PSYCHIATRY: No anxiety or depression.   ROS  DRUG ALLERGIES:  No Known Allergies  VITALS:  Blood pressure 114/86, pulse 77, temperature 98.3 F (36.8 C), temperature source Oral, resp. rate 20, height 5\' 8"  (1.727 m), weight (!) 169.4 kg, SpO2 100 %.  PHYSICAL EXAMINATION:  GENERAL:  58 y.o.-year-old patient lying in the bed with no acute distress.  EYES: Pupils equal, round, reactive to light and accommodation. No scleral icterus. Extraocular muscles intact.  HEENT: Head atraumatic, normocephalic. Oropharynx and nasopharynx clear.  NECK:  Supple, no jugular venous distention. No thyroid enlargement, no tenderness.  LUNGS: Normal breath sounds bilaterally, no wheezing, rales,rhonchi or crepitation. No use of accessory muscles of respiration.  CARDIOVASCULAR: S1, S2 normal. No murmurs, rubs, or gallops.  ABDOMEN: Soft, nontender, nondistended. Bowel sounds present. No organomegaly or mass.   EXTREMITIES: No pedal edema, cyanosis, or clubbing.  NEUROLOGIC: Cranial nerves II through XII are intact. Muscle strength 5/5 in all extremities. Sensation intact. Gait not checked.  PSYCHIATRIC: The patient is alert and oriented x 3.  SKIN: No obvious rash, lesion, or ulcer.   Physical Exam LABORATORY PANEL:   CBC Recent Labs  Lab 05/29/18 0748  WBC 5.0  HGB 10.8*  HCT 35.0*  PLT 194   ------------------------------------------------------------------------------------------------------------------  Chemistries  Recent Labs  Lab 05/29/18 0748 05/30/18 0328  NA 137 136  K 2.9* 3.8  CL 103 95*  CO2 23 29  GLUCOSE 144* 108*  BUN 6 10  CREATININE 0.84 0.98  CALCIUM 8.1* 8.5*  MG 1.7  --   AST 23  --   ALT 12  --   ALKPHOS 85  --   BILITOT 0.8  --    ------------------------------------------------------------------------------------------------------------------  Cardiac Enzymes No results for input(s): TROPONINI in the last 168 hours. ------------------------------------------------------------------------------------------------------------------  RADIOLOGY:  Dg Chest Port 1 View  Result Date: 05/29/2018 CLINICAL DATA:  Dyspnea.  COPD. EXAM: PORTABLE CHEST 1 VIEW COMPARISON:  03/18/2018 FINDINGS: There is chronic borderline cardiomegaly. Pulmonary vascularity is at the upper limits of normal. No infiltrates or effusions. No significant bone abnormality. IMPRESSION: No acute abnormality.  Chronic mild cardiomegaly. Electronically Signed   By: Francene Boyers M.D.   On: 05/29/2018 08:39    ASSESSMENT AND PLAN:  *Acute hypoxic respiratory failure Stable Most likely secondary to acute on chronic A. fib with RVR, extreme morbid obesity, obstructive sleep apnea, and probable acute viral upper respiratory tract infection Supplemental oxygen with weaning as tolerated  *Acute A. fib with RVR Most likely exacerbated by viral upper respiratory tract infection and  inability  to tolerate rate control agents Currently rate controlled on beta-blocker therapy, cardiology did see patient-started on amiodarone/may require cardioversion in the near future, on Xarelto  *Acute probable viral respiratory tract infection with nausea, emesis, rhinorrhea, nonproductive cough Resolving Influenza/coronavirus/respiratory panel negative Supplemental oxygen with supportive care  *Acute on chronic allergic rhinitis Flonase and Claritin  *Chronic systolic congestive heart failure without exacerbation with cardiomyopathy Ejection fraction 25-30% on most recent echocardiogram from February 2020 Stable Continue lisinopril, torsemide, Zaroxolyn, on Xarelto, strict I&O monitoring, daily weights, spironolactone, cardiology input appreciated, BMP in the morning   *Chronic obstructive sleep apnea CPAP at bedtime/as needed  *COPD/asthma without exacerbation Breathing treatments PRN  *Chronic extreme morbid obesity Most likely secondary to excess calories Lifestyle modification recommended  *Chronic osteoarthritis/chronic pain syndrome Schedule Neurontin, Flexeril as needed, Mobic, gout diet  *Chronic gout without flare Gout diet  DVT prophylaxis-on Xarelto  All the records are reviewed and case discussed with Care Management/Social Workerr. Management plans discussed with the patient, family and they are in agreement.  CODE STATUS: full  TOTAL TIME TAKING CARE OF THIS PATIENT: 32 minutes.     POSSIBLE D/C IN 1-2 DAYS, DEPENDING ON CLINICAL CONDITION.   Evelena Asa Lashanta Elbe M.D on 05/30/2018   Between 7am to 6pm - Pager - [phone]  After 6pm go to [email or web address] - password [email or web address]  [facility] [place] [facility]  Office  [phone]  CC: Primary care physician; Patient, No Pcp Per  Note: This dictation was prepared with Dragon dictation along with smaller phrase technology. Any transcriptional errors that result from this process are  unintentional.

## 2018-05-30 NOTE — TOC Initial Note (Signed)
Transition of Care [facility]) - Initial/Assessment Note    Patient Details  Name: [name] [name] MRN: [id number] Date of Birth: 01/21/1961  Transition of Care [facility]) CM/SW Contact:    Sherren Kerns, RN Phone Number: 05/30/2018, 10:10 AM  Clinical Narrative:     Patient is from home alone.  His girlfriend with whom he lived with passed away 2 weeks ago.  He is from [place] and is not very familiar with the area.  He lives in [place].  No PCP;  He was supposed to go to a new patient appointment but states he couldn't make it and can't remember who it was with.  This RNCM set him up last admission with home health services through [facility].  Dr. Judithann Sheen was to sign off for home health for 1 month.  Asked patient if I could set him up with a PCP appointment with [facility] practice as he lives in [place] and they accept Medicaid.  He gave permission.   Was able to get patient an appointment with Aura Dials at 1pm on Monday 20th.  Explains importance of keeping this PCP appointment.  He does a a functioning scale at home and weighs.   Obtains medications at [facility] without difficulty.  Refferal made to [place] with [facility] for [facility] services including RN, PT, Aide and [employer] and was accepted.  Will notify Rosey Bath at discharge.  Patient has a walker and a wheelchair at home.            Expected Discharge Plan: Home w Home Health Services Barriers to Discharge: Continued Medical Work up   Patient Goals and CMS Choice Patient states their goals for this hospitalization and ongoing recovery are:: go home and stay well CMS Medicare.gov Compare Post Acute Care list provided to:: Patient Choice offered to / list presented to : Patient  Expected Discharge Plan and Services Expected Discharge Plan: Home w Home Health Services In-house Referral: PCP / [facility] Discharge Planning Services: CM Consult Post Acute Care Choice: Home Health Living arrangements for the past 2 months: Single Family  Home                     HH Arranged: RN, PT, Nurse's Aide(Reds Teacher, early years/pre) HH Agency: [facility] (now [facility])  Prior Living Arrangements/Services Living arrangements for the past 2 months: Single Family Home Lives with:: Self Patient language and need for interpreter reviewed:: Yes Do you feel safe going back to the place where you live?: Yes          Current home services: Homehealth aide, Home PT, Home RN Criminal Activity/Legal Involvement Pertinent to Current Situation/Hospitalization: No - Comment as needed  Activities of Daily Living Home Assistive Devices/Equipment: CPAP, Wheelchair, Environmental consultant (specify type) ADL Screening (condition at time of admission) Patient's cognitive ability adequate to safely complete daily activities?: Yes Is the patient deaf or have difficulty hearing?: No Does the patient have difficulty seeing, even when wearing glasses/contacts?: No Does the patient have difficulty concentrating, remembering, or making decisions?: No Patient able to express need for assistance with ADLs?: Yes Does the patient have difficulty dressing or bathing?: No(needs help with wound care) Independently performs ADLs?: Yes (appropriate for developmental age) Does the patient have difficulty walking or climbing stairs?: Yes Weakness of Legs: Both Weakness of Arms/Hands: None  Permission Sought/Granted Permission sought to share information with : Facility Industrial/product designer granted to share information with : Yes, Verbal Permission Granted  Permission granted to share info w AGENCY: home health agencies        Emotional Assessment Appearance:: Appears stated age Attitude/Demeanor/Rapport: Gracious Affect (typically observed): Accepting Orientation: : Oriented to Self, Oriented to Place, Oriented to  Time, Oriented to Situation Alcohol / Substance Use: Not Applicable    Admission diagnosis:  Hypoxia [R09.02] Cystitis  [N30.90] Non-intractable vomiting with nausea, unspecified vomiting type [R11.2] Patient Active Problem List   Diagnosis Date Noted  . Respiratory failure (HCC) 05/29/2018  . Persistent atrial fibrillation   . Sepsis (HCC) 03/17/2018  . Cellulitis 03/17/2018  . Chest pain 02/23/2018  . Acute on chronic systolic CHF (congestive heart failure) (HCC) 02/02/2018  . COPD (chronic obstructive pulmonary disease) (HCC) 02/02/2018  . Obstructive sleep apnea 02/02/2018  . Lymphedema 02/02/2018  . Acute respiratory failure with hypoxia (HCC) 01/27/2018  . Dyspnea   . SOB (shortness of breath)   . Acute respiratory failure (HCC) 01/16/2018   PCP:  Patient, No Pcp Per Pharmacy:   [facility] [address] [place] - [address] [place] [address] Phone: [phone] Fax: [phone]  [facility] [address] ([address]), [place] - [address] [place] ([address]) [place] [address] Phone: [phone] Fax: [phone]     Social Determinants of Health (SDOH) Interventions    Readmission Risk Interventions Readmission Risk Prevention Plan 05/30/2018  Transportation Screening Complete  PCP or Specialist Appt within 3-5 Days Complete  HRI or Home Care Consult Complete  Social Work Consult for Recovery Care Planning/Counseling Complete  Palliative Care Screening Not Applicable  Medication Review Oceanographer) Complete

## 2018-05-30 NOTE — Progress Notes (Signed)
Progress Note  Patient Name: [name] [name] Date of Encounter: 05/30/2018  Primary Cardiologist: Kendre Jacinto  Subjective   Feels better with resolution of chest pain.  Still short of breath and with chest congestion.  No significant leg edema or orthopnea.  Inpatient Medications    Scheduled Meds: . amiodarone  400 mg Oral BID  . budesonide (PULMICORT) nebulizer solution  0.5 mg Nebulization BID  . colchicine  0.6 mg Oral BID  . fluticasone  2 spray Each Nare BID  . gabapentin  300 mg Oral TID  . lisinopril  2.5 mg Oral Daily  . loratadine  10 mg Oral Daily  . meloxicam  15 mg Oral Daily  . metoprolol succinate  50 mg Oral BID  . potassium chloride SA  40 mEq Oral BID  . rivaroxaban  20 mg Oral QPM  . sodium chloride flush  3 mL Intravenous Q12H  . sodium chloride flush  3 mL Intravenous Q12H  . spironolactone  25 mg Oral Daily  . torsemide  40 mg Oral Daily   Continuous Infusions: . sodium chloride    . ondansetron (ZOFRAN) IV     PRN Meds: sodium chloride, acetaminophen **OR** acetaminophen, albuterol, cyclobenzaprine, HYDROcodone-acetaminophen, Ipratropium-Albuterol, metoprolol tartrate, nitroGLYCERIN, ondansetron **OR** ondansetron (ZOFRAN) IV, polyethylene glycol, sodium chloride flush, traZODone   Vital Signs    Vitals:   05/29/18 1645 05/29/18 1927 05/30/18 0510 05/30/18 0821  BP: 112/63 (!) 125/107 (!) 96/53 114/86  Pulse: 74 (!) 57 75 77  Resp:  19 20   Temp: 99.5 F (37.5 C) (!) 97.3 F (36.3 C) 98.7 F (37.1 C) 98.3 F (36.8 C)  TempSrc: Oral Oral Oral Oral  SpO2: 100% 100% 97% 100%  Weight:   (!) 169.4 kg   Height:        Intake/Output Summary (Last 24 hours) at 05/30/2018 1436 Last data filed at 05/30/2018 1120 Gross per 24 hour  Intake 6 ml  Output 1525 ml  Net -1519 ml   Last 3 Weights 05/30/2018 05/29/2018 05/29/2018  Weight (lbs) 373 lb 6.4 oz 372 lb 360 lb  Weight (kg) 169.373 kg 168.738 kg 163.295 kg      Telemetry    Atrial fibrillation  (ventricular rates 70-100 bpm with occasional spikes up to 135 bpm) - Personally Reviewed  ECG    Atrial fibrillation (rate 66 bpm) with left axis deviation and LBBB - Personally Reviewed  Physical Exam   GEN: No acute distress.   Neck: Unable to assess JVP due to body habitus.  Cardiac: Distant heart sounds.  Irregularly irregular without murmurs. Respiratory: Mildly diminished breath sounds throughout.  No wheezes or crackles. GI: Soft, nontender, non-distended  MS: Trace BLE edema.  Open wound on left lateral calf; No deformity. Neuro:  Nonfocal  Psych: Normal affect   Labs    Chemistry Recent Labs  Lab 05/29/18 0748 05/30/18 0328  NA 137 136  K 2.9* 3.8  CL 103 95*  CO2 23 29  GLUCOSE 144* 108*  BUN 6 10  CREATININE 0.84 0.98  CALCIUM 8.1* 8.5*  PROT 7.3  --   ALBUMIN 3.2*  --   AST 23  --   ALT 12  --   ALKPHOS 85  --   BILITOT 0.8  --   GFRNONAA >60 >60  GFRAA >60 >60  ANIONGAP 11 12     Hematology Recent Labs  Lab 05/29/18 0748  WBC 5.0  RBC 4.26  HGB 10.8*  HCT 35.0*  MCV  82.2  MCH 25.4*  MCHC 30.9  RDW 17.8*  PLT 194    Cardiac EnzymesNo results for input(s): TROPONINI in the last 168 hours. No results for input(s): TROPIPOC in the last 168 hours.   BNP Recent Labs  Lab 05/29/18 0748  BNP 289.0*     DDimer No results for input(s): DDIMER in the last 168 hours.   Radiology    Dg Chest Port 1 View  Result Date: 05/29/2018 CLINICAL DATA:  Dyspnea.  COPD. EXAM: PORTABLE CHEST 1 VIEW COMPARISON:  03/18/2018 FINDINGS: There is chronic borderline cardiomegaly. Pulmonary vascularity is at the upper limits of normal. No infiltrates or effusions. No significant bone abnormality. IMPRESSION: No acute abnormality.  Chronic mild cardiomegaly. Electronically Signed   By: Francene Boyers M.D.   On: 05/29/2018 08:39    Cardiac Studies  Echo (03/24/2018): Technically difficult study. Mildly dilated LV with LVEF 25-30%.  Moderate LAE.  Patient  Profile     58 y.o. male with chronic systolic heart failure and recurrent persistent atrial fibrillation following recent cardioversion, admitted with acute hypoxic respiratory failure in the setting of suspected viral respiratory infection and atrial fibrillation with rapid ventricular response.  Assessment & Plan    Persistent atrial fibrillation Ventricular rates overall well-controlled with only occasional brief spikes above 110 bpm.  Continue amiodarone 400 mg BID to complete 10 gm load, then 200 mg daily thereafter.  Once respiratory infection has cleared and amiodorone load completed, could consider DCCV again if rate control becomes difficult.  Continue metoprolol succinate 50 mg BID.  Continue rivaroxaban 20 mg daily.  Chronic systolic heart failure Today's weight is up from admission (question accuracy of admission weight) but still down about 2 kg from time of most recent hospital discharge in February.  Patient received torsemide 100 mg PO x 1 yesterday (held this AM) as well as metolazone 5 mg PO x 1 this AM.  He was net negative 1.3 L over the last 24 hours.  Clinical volume assessment is challenging due to morbid obesity, but patient reports that he does not feel like he is retaining fluid.  Decrease torsemide to 40 mg daily.  Would avoid routine use of metolazone unless the patient is significantly volume overloaded and is not responding to max doses of torsemide or IV furosemide.  Continue metoprolol, lisinopril, and spironolactone.  Replete potassium (goal > 4.0).  Respiratory infection COVID-19 testing negative. Further management per internal medicine.    For questions or updates, please contact [facility] Please consult [email or web address] for contact info under   Signed, [name], MD  05/30/2018, 2:36 PM

## 2018-05-31 ENCOUNTER — Encounter: Payer: Self-pay | Admitting: Nurse Practitioner

## 2018-05-31 DIAGNOSIS — I10 Essential (primary) hypertension: Secondary | ICD-10-CM

## 2018-05-31 DIAGNOSIS — I5043 Acute on chronic combined systolic (congestive) and diastolic (congestive) heart failure: Secondary | ICD-10-CM

## 2018-05-31 DIAGNOSIS — I4819 Other persistent atrial fibrillation: Secondary | ICD-10-CM

## 2018-05-31 DIAGNOSIS — I420 Dilated cardiomyopathy: Secondary | ICD-10-CM

## 2018-05-31 LAB — BASIC METABOLIC PANEL
Anion gap: 10 (ref 5–15)
BUN: 19 mg/dL (ref 6–20)
CO2: 29 mmol/L (ref 22–32)
Calcium: 8.9 mg/dL (ref 8.9–10.3)
Chloride: 94 mmol/L — ABNORMAL LOW (ref 98–111)
Creatinine, Ser: 1.26 mg/dL — ABNORMAL HIGH (ref 0.61–1.24)
GFR calc Af Amer: >60 mL/min (ref >60)
GFR calc non Af Amer: >60 mL/min (ref >60)
Glucose, Bld: 105 mg/dL — ABNORMAL HIGH (ref 70–99)
Potassium: 4.2 mmol/L (ref 3.5–5.1)
Sodium: 133 mmol/L — ABNORMAL LOW (ref 135–145)

## 2018-05-31 MED ORDER — LISINOPRIL 5 MG PO TABS: 5.0000 mg | ORAL_TABLET | Freq: Every day | ORAL | Status: DC

## 2018-05-31 MED ORDER — KETOTIFEN FUMARATE 0.025 % OP SOLN
1.0000 [drp] | Freq: Two times a day (BID) | OPHTHALMIC | Status: DC
  Administered 2018-05-31 – 2018-06-01 (×3): 1 [drp] via OPHTHALMIC
  Filled 2018-05-31: qty 5

## 2018-05-31 NOTE — Plan of Care (Signed)
  Problem: Clinical Measurements: Goal: Ability to maintain clinical measurements within normal limits will improve Outcome: Not Progressing Note:  Sodium level is low today at only 133. Will continue to monitor lab values. Jari Favre [facility]

## 2018-05-31 NOTE — Progress Notes (Addendum)
[employer] -  at [facility]   PATIENT NAME: [name] [name]    MR#:  [id number]  DATE OF BIRTH:  Jun 30, 1960  SUBJECTIVE:  CHIEF COMPLAINT:   Chief Complaint  Patient presents with  . Shortness of Breath  Complains of eye itch, weakness, continues to have O2 requirement, cardiology input appreciated, continues to be in atrial fibrillation, denies chest pain/shortness of breath, will assess for home oxygen, wean O2 off as tolerated, ambulate well in the halls REVIEW OF SYSTEMS:  CONSTITUTIONAL: No fever, fatigue or weakness.  EYES: No blurred or double vision.  EARS, NOSE, AND THROAT: No tinnitus or ear pain.  RESPIRATORY: No cough, shortness of breath, wheezing or hemoptysis.  CARDIOVASCULAR: No chest pain, orthopnea, edema.  GASTROINTESTINAL: No nausea, vomiting, diarrhea or abdominal pain.  GENITOURINARY: No dysuria, hematuria.  ENDOCRINE: No polyuria, nocturia,  HEMATOLOGY: No anemia, easy bruising or bleeding SKIN: No rash or lesion. MUSCULOSKELETAL: No joint pain or arthritis.   NEUROLOGIC: No tingling, numbness, weakness.  PSYCHIATRY: No anxiety or depression.   ROS  DRUG ALLERGIES:  No Known Allergies  VITALS:  Blood pressure (!) 159/126, pulse 99, temperature 98.4 F (36.9 C), temperature source Oral, resp. rate 20, height 5\' 8"  (1.727 m), weight (!) 171.6 kg, SpO2 100 %.  PHYSICAL EXAMINATION:  GENERAL:  58 y.o.-year-old patient lying in the bed with no acute distress.  EYES: Pupils equal, round, reactive to light and accommodation. No scleral icterus. Extraocular muscles intact.  HEENT: Head atraumatic, normocephalic. Oropharynx and nasopharynx clear.  NECK:  Supple, no jugular venous distention. No thyroid enlargement, no tenderness.  LUNGS: Normal breath sounds bilaterally, no wheezing, rales,rhonchi or crepitation. No use of accessory muscles of respiration.  CARDIOVASCULAR: S1, S2 normal. No murmurs, rubs, or gallops.  ABDOMEN: Soft,  nontender, nondistended. Bowel sounds present. No organomegaly or mass.  EXTREMITIES: No pedal edema, cyanosis, or clubbing.  NEUROLOGIC: Cranial nerves II through XII are intact. Muscle strength 5/5 in all extremities. Sensation intact. Gait not checked.  PSYCHIATRIC: The patient is alert and oriented x 3.  SKIN: No obvious rash, lesion, or ulcer.   Physical Exam LABORATORY PANEL:   CBC Recent Labs  Lab 05/29/18 0748  WBC 5.0  HGB 10.8*  HCT 35.0*  PLT 194   ------------------------------------------------------------------------------------------------------------------  Chemistries  Recent Labs  Lab 05/29/18 0748  05/31/18 0421  NA 137   < > 133*  K 2.9*   < > 4.2  CL 103   < > 94*  CO2 23   < > 29  GLUCOSE 144*   < > 105*  BUN 6   < > 19  CREATININE 0.84   < > 1.26*  CALCIUM 8.1*   < > 8.9  MG 1.7  --   --   AST 23  --   --   ALT 12  --   --   ALKPHOS 85  --   --   BILITOT 0.8  --   --    < > = values in this interval not displayed.   ------------------------------------------------------------------------------------------------------------------  Cardiac Enzymes No results for input(s): TROPONINI in the last 168 hours. ------------------------------------------------------------------------------------------------------------------  RADIOLOGY:  No results found.  ASSESSMENT AND PLAN:  *Acute hypoxic respiratory failure Resolved  most likely secondary to acute on chronic A. fib with RVR, extreme morbid obesity, obstructive sleep apnea, and probable acute viral upper respiratory tract infection Supplemental oxygen with weaning as tolerated-we will assess for home oxygen  *Acute A. fib with  RVR Rate controlled Most likely exacerbated by viral upper respiratory tract infection and inability to tolerate rate control agents Continue beta-blocker therapy, cardiology did see patient-started on amiodarone/plans for outpatient cardioversion, on Xarelto  *Acute  probable viral respiratory tract infection with nausea, emesis, rhinorrhea, nonproductive cough Resolving Influenza/coronavirus/respiratory panel negative Supplemental oxygen with supportive care  *Acute on chronic allergic rhinitis Flonase and Claritin  *Chronic systolic congestive heart failure without exacerbation with cardiomyopathy Ejection fraction 25-30% on most recent echocardiogram from February 2020 Stable Continue lisinopril, torsemide, received Zaroxolyn x 1, on Xarelto, strict I&O monitoring, daily weights, spironolactone, cardiology input appreciated    *Chronic obstructive sleep apnea CPAP at bedtime/as needed  *COPD/asthma without exacerbation Breathing treatments PRN  *Chronic extreme morbid obesity Most likely secondary to excess calories Lifestyle modification recommended  *Chronic osteoarthritis/chronic pain syndrome Schedule Neurontin, Flexeril as needed, Mobic, gout diet  *Chronic gout without flare Gout diet  DVT prophylaxis-on Xarelto  All the records are reviewed and case discussed with Care Management/Social Workerr. Management plans discussed with the patient, family and they are in agreement.  CODE STATUS: full  TOTAL TIME TAKING CARE OF THIS PATIENT: 30 minutes.     POSSIBLE D/C IN 1-2 DAYS, DEPENDING ON CLINICAL CONDITION.   Evelena Asa  M.D on 05/31/2018   Between 7am to 6pm - Pager - [phone]  After 6pm go to [email or web address] - password [email or web address]  [facility] [place] [facility]  Office  [phone]  CC: Primary care physician; Patient, No Pcp Per  Note: This dictation was prepared with Dragon dictation along with smaller phrase technology. Any transcriptional errors that result from this process are unintentional.

## 2018-05-31 NOTE — Progress Notes (Signed)
Changed patient's BLE wound dressings at this time, as ordered Q Wednesday. Patient tolerated well. Will continue to monitor wound dressing status and pass along in shift report. Jari Favre [place] [facility]

## 2018-05-31 NOTE — Progress Notes (Signed)
Progress Note  Patient Name: [name] [name] Date of Encounter: 05/31/2018  Primary Cardiologist: End  Subjective   Feels better this morning Does not feel that he has significant leg swelling Little bit of nausea, has not had a bowel movement in several days Hungry this morning, ready for breakfast On oxygen in the hospital Reports that he would like to have oxygen at home as he gets very short of breath when ambulating around his house Lives by himself Does groceries himself When he has fluid typically calves get very tight.  The calves are very soft  Inpatient Medications    Scheduled Meds: . amiodarone  400 mg Oral BID  . budesonide (PULMICORT) nebulizer solution  0.5 mg Nebulization BID  . colchicine  0.6 mg Oral BID  . fluticasone  2 spray Each Nare BID  . gabapentin  300 mg Oral TID  . lisinopril  2.5 mg Oral Daily  . loratadine  10 mg Oral Daily  . meloxicam  15 mg Oral Daily  . metoprolol succinate  50 mg Oral BID  . potassium chloride SA  40 mEq Oral BID  . rivaroxaban  20 mg Oral QPM  . sodium chloride flush  3 mL Intravenous Q12H  . sodium chloride flush  3 mL Intravenous Q12H  . spironolactone  25 mg Oral Daily  . torsemide  40 mg Oral Daily   Continuous Infusions: . sodium chloride    . ondansetron (ZOFRAN) IV     PRN Meds: sodium chloride, acetaminophen **OR** acetaminophen, albuterol, cyclobenzaprine, HYDROcodone-acetaminophen, Ipratropium-Albuterol, metoprolol tartrate, nitroGLYCERIN, ondansetron **OR** ondansetron (ZOFRAN) IV, polyethylene glycol, sodium chloride flush, traZODone   Vital Signs    Vitals:   05/30/18 2202 05/31/18 0422 05/31/18 0638 05/31/18 0807  BP:  (!) 125/91  (!) 159/126  Pulse: 78 (!) 122 69 99  Resp: 20 (!) 22  20  Temp:  98.4 F (36.9 C)  98.4 F (36.9 C)  TempSrc:  Oral  Oral  SpO2: 100% 100%  100%  Weight:  (!) 171.6 kg    Height:        Intake/Output Summary (Last 24 hours) at 05/31/2018 0950 Last data filed  at 05/31/2018 0916 Gross per 24 hour  Intake 360 ml  Output 370 ml  Net -10 ml   Last 3 Weights 05/31/2018 05/30/2018 05/29/2018  Weight (lbs) 378 lb 5 oz 373 lb 6.4 oz 372 lb  Weight (kg) 171.6 kg 169.373 kg 168.738 kg      Telemetry    Atrial fibrillation (ventricular rates 70-100 bpm)- Personally Reviewed  ECG    Atrial fibrillation (rate 66 bpm) with left axis deviation and LBBB - Personally Reviewed  Physical Exam   GEN: No acute distress.   Neck: Unable to assess JVP due to body habitus.  Cardiac:  Irregularly irregular , distant heart sounds.  Irregularly irregular without murmurs. Respiratory: Mildly diminished breath sounds throughout.  No wheezes or crackles. GI: Soft, nontender, non-distended  MS:  Swollen lower extremities Ace wraps hanging around his ankles, no significant pitting edema Open wound on left lateral calf; No deformity. Neuro:  Nonfocal  Psych: Normal affect   Labs    Chemistry Recent Labs  Lab 05/29/18 0748 05/30/18 0328 05/31/18 0421  NA 137 136 133*  K 2.9* 3.8 4.2  CL 103 95* 94*  CO2 23 29 29   GLUCOSE 144* 108* 105*  BUN 6 10 19   CREATININE 0.84 0.98 1.26*  CALCIUM 8.1* 8.5* 8.9  PROT 7.3  --   --  ALBUMIN 3.2*  --   --   AST 23  --   --   ALT 12  --   --   ALKPHOS 85  --   --   BILITOT 0.8  --   --   GFRNONAA >60 >60 >60  GFRAA >60 >60 >60  ANIONGAP 11 12 10      Hematology Recent Labs  Lab 05/29/18 0748  WBC 5.0  RBC 4.26  HGB 10.8*  HCT 35.0*  MCV 82.2  MCH 25.4*  MCHC 30.9  RDW 17.8*  PLT 194    Cardiac EnzymesNo results for input(s): TROPONINI in the last 168 hours. No results for input(s): TROPIPOC in the last 168 hours.   BNP Recent Labs  Lab 05/29/18 0748  BNP 289.0*     DDimer No results for input(s): DDIMER in the last 168 hours.   Radiology    No results found.  Cardiac Studies  Echo (03/24/2018): Technically difficult study. Mildly dilated LV with LVEF 25-30%.  Moderate LAE.  Patient Profile      58 y.o. male with chronic systolic heart failure and recurrent persistent atrial fibrillation following recent cardioversion, admitted with acute hypoxic respiratory failure in the setting of suspected viral respiratory infection and atrial fibrillation with rapid ventricular response.  Assessment & Plan    Persistent atrial fibrillation Rates are improved They do increase with exertion up to 100, maximum 110 with exertion then goes back down at rest At baseline 65 bpm ---Continue amiodarone 400 mg BID to complete 10 gm load, then 200 mg daily thereafter. --- We will suggest to him we set up a outpatient DCCV ---Continue metoprolol succinate 50 mg BID. ---Continue rivaroxaban 20 mg daily.  Chronic systolic heart failure For now would continue torsemide to 40 mg daily. Fluids have been restricted in the hospital Long discussion with him, at home he drinks 2 L bottle of soda every day with other beverages Very high fluid input at home -At home is been taking torsemide 100 daily Suggested with him that he take extra half dose torsemide after lunch for calf swelling tightness -Avoid metolazone --Continue metoprolol, lisinopril, and spironolactone.  Respiratory infection COVID-19 testing negative. Further management per internal medicine.    Chronic respiratory distress Morbid obesity contributing, deconditioning Long history of secondhand smoke as a child parents smoked 2 packs a day each -Would consider ambulatory saturations and oxygen for saturations less than 90%   Total encounter time more than 25 minutes  Greater than 50% was spent in counseling and coordination of care with the patient   For questions or updates, please contact [facility] Please consult [email or web address] for contact info under   Signed, Julien Nordmann, MD  05/31/2018, 9:50 AM

## 2018-05-31 NOTE — Progress Notes (Signed)
SATURATION QUALIFICATIONS: (This note is used to comply with regulatory documentation for home oxygen)  Patient Saturations on Room Air at Rest = 91%  Patient Saturations on Room Air while Ambulating = 87%  Patient Saturations on 3 Liters of oxygen while Ambulating = 100%  Please briefly explain why patient needs home oxygen: N/A [name] [name]

## 2018-06-01 ENCOUNTER — Telehealth: Payer: Self-pay | Admitting: Internal Medicine

## 2018-06-01 DIAGNOSIS — J9601 Acute respiratory failure with hypoxia: Secondary | ICD-10-CM

## 2018-06-01 DIAGNOSIS — R0902 Hypoxemia: Secondary | ICD-10-CM

## 2018-06-01 LAB — BASIC METABOLIC PANEL
Anion gap: 9 (ref 5–15)
BUN: 26 mg/dL — ABNORMAL HIGH (ref 6–20)
CO2: 30 mmol/L (ref 22–32)
Calcium: 8.4 mg/dL — ABNORMAL LOW (ref 8.9–10.3)
Chloride: 90 mmol/L — ABNORMAL LOW (ref 98–111)
Creatinine, Ser: 1.37 mg/dL — ABNORMAL HIGH (ref 0.61–1.24)
GFR calc Af Amer: >60 mL/min (ref >60)
GFR calc non Af Amer: 57 mL/min — ABNORMAL LOW (ref >60)
Glucose, Bld: 129 mg/dL — ABNORMAL HIGH (ref 70–99)
Potassium: 3.8 mmol/L (ref 3.5–5.1)
Sodium: 129 mmol/L — ABNORMAL LOW (ref 135–145)

## 2018-06-01 MED ORDER — TORSEMIDE 20 MG PO TABS: 40.0000 mg | ORAL_TABLET | Freq: Every day | ORAL | Status: DC

## 2018-06-01 MED ORDER — GABAPENTIN 300 MG PO CAPS: 300.0000 mg | ORAL_CAPSULE | Freq: Three times a day (TID) | ORAL | 0 refills | Status: DC

## 2018-06-01 MED ORDER — TORSEMIDE 20 MG PO TABS: 40.0000 mg | ORAL_TABLET | Freq: Every day | ORAL | 0 refills | Status: DC

## 2018-06-01 MED ORDER — AMIODARONE HCL 200 MG PO TABS: 200.0000 mg | ORAL_TABLET | Freq: Two times a day (BID) | ORAL | 0 refills | Status: DC

## 2018-06-01 MED ORDER — LISINOPRIL 5 MG PO TABS: 5.0000 mg | ORAL_TABLET | Freq: Every day | ORAL | Status: DC

## 2018-06-01 MED ORDER — SPIRONOLACTONE 25 MG PO TABS: 25.0000 mg | ORAL_TABLET | Freq: Every day | ORAL | Status: DC

## 2018-06-01 MED ORDER — AMIODARONE HCL 200 MG PO TABS: 200.0000 mg | ORAL_TABLET | ORAL | 0 refills | Status: DC

## 2018-06-01 MED ORDER — ALBUTEROL SULFATE HFA 108 (90 BASE) MCG/ACT IN AERS: 2.0000 | INHALATION_SPRAY | Freq: Four times a day (QID) | RESPIRATORY_TRACT | 1 refills | Status: DC | PRN

## 2018-06-01 MED ORDER — FLUTICASONE PROPIONATE 50 MCG/ACT NA SUSP: 2.0000 | Freq: Two times a day (BID) | NASAL | 2 refills | Status: DC

## 2018-06-01 MED ORDER — LORATADINE 10 MG PO TABS: 10.0000 mg | ORAL_TABLET | Freq: Every day | ORAL | 0 refills | Status: DC

## 2018-06-01 MED ORDER — MELOXICAM 15 MG PO TABS: 15.0000 mg | ORAL_TABLET | Freq: Every day | ORAL | 0 refills | Status: DC

## 2018-06-01 MED ORDER — COLCHICINE 0.6 MG PO TABS: 0.6000 mg | ORAL_TABLET | Freq: Every day | ORAL | 0 refills | Status: DC

## 2018-06-01 NOTE — Progress Notes (Signed)
[name] [name] to be D/C'd Home per MD order.  Discussed prescriptions and follow up appointments with the patient. Prescriptions given to patient, medication list explained in detail. Pt verbalized understanding.  Allergies as of 06/01/2018   No Known Allergies     Medication List    STOP taking these medications   furosemide 40 MG tablet Commonly known as:  LASIX     TAKE these medications   albuterol 108 (90 Base) MCG/ACT inhaler Commonly known as:  PROVENTIL HFA;VENTOLIN HFA Inhale 2 puffs into the lungs every 6 (six) hours as needed for wheezing or shortness of breath.   amiodarone 200 MG tablet Commonly known as:  PACERONE Take 1 tablet (200 mg total) by mouth as directed. 2 tablets p.o. twice daily for 4 days, then 1 tablet p.o. daily thereafter   colchicine 0.6 MG tablet Take 1 tablet (0.6 mg total) by mouth daily.   fluticasone 50 MCG/ACT nasal spray Commonly known as:  FLONASE Place 2 sprays into both nostrils 2 (two) times daily.   gabapentin 300 MG capsule Commonly known as:  NEURONTIN Take 1 capsule (300 mg total) by mouth 3 (three) times daily.   lisinopril 5 MG tablet Commonly known as:  PRINIVIL,ZESTRIL Take 2.5 mg by mouth daily.   loratadine 10 MG tablet Commonly known as:  CLARITIN Take 1 tablet (10 mg total) by mouth daily. Start taking on:  June 02, 2018   meloxicam 15 MG tablet Commonly known as:  MOBIC Take 1 tablet (15 mg total) by mouth daily. Start taking on:  June 02, 2018   metoprolol succinate 50 MG 24 hr tablet Commonly known as:  TOPROL-XL Take 1 tablet (50 mg total) by mouth 2 (two) times daily. Take with or immediately following a meal.   nitroGLYCERIN 0.4 MG SL tablet Commonly known as:  NITROSTAT Place 1 tablet (0.4 mg total) under the tongue every 5 (five) minutes as needed for chest pain.   potassium chloride SA 20 MEQ tablet Commonly known as:  K-DUR,KLOR-CON Take 1 tablet (20 mEq total) by mouth 2 (two) times daily.    rivaroxaban 20 MG Tabs tablet Commonly known as:  XARELTO Take 1 tablet (20 mg total) by mouth daily. What changed:  when to take this   spironolactone 25 MG tablet Commonly known as:  ALDACTONE Take 1 tablet (25 mg total) by mouth daily.   torsemide 20 MG tablet Commonly known as:  DEMADEX Take 2 tablets (40 mg total) by mouth daily. Start taking on:  June 02, 2018 What changed:    medication strength  how much to take   traZODone 150 MG tablet Commonly known as:  DESYREL Take 1 tablet (150 mg total) by mouth at bedtime as needed for sleep.       Vitals:   06/01/18 1100 06/01/18 1146  BP: (!) 115/52   Pulse: 67   Resp: 18   Temp:    SpO2: 99% 98%    Skin clean, dry and intact without evidence of skin break down, no evidence of skin tears noted. IV catheter discontinued intact. Site without signs and symptoms of complications. Dressing and pressure applied. Pt denies pain at this time. No complaints noted.  An After Visit Summary was printed and given to the patient. Patient escorted via WC, and D/C home via private auto.  [name] [name] [name] [name]

## 2018-06-01 NOTE — Telephone Encounter (Signed)
[facility] 2A calling Scheduled ED f/u with Dr End on 4/23 Consent pending

## 2018-06-01 NOTE — TOC Transition Note (Addendum)
Transition of Care [facility]) - CM/SW Discharge Note   Patient Details  Name: [name] [name] MRN: [id number] Date of Birth: 06-01-1960  Transition of [facility] CM/SW Contact:  Sherren Kerns, RN Phone Number: 06/01/2018, 12:13 PM   Clinical Narrative:   Patient is discharging today.  Rosey Bath with [facility] is aware.  Patient is 98% on room air; no need for oxygen.  No further needs identified.      Final next level of care: Home w Home Health Services Barriers to Discharge: No Barriers Identified   Patient Goals and CMS Choice Patient states their goals for this hospitalization and ongoing recovery are:: go home and stay well CMS Medicare.gov Compare Post Acute Care list provided to:: Patient Choice offered to / list presented to : Patient  Discharge Placement                       Discharge Plan and Services In-house Referral: PCP / Health Connect Discharge Planning Services: CM Consult Post Acute Care Choice: Home Health          DME Arranged: Oxygen DME Agency: [facility] HH Arranged: RN, PT, Nurse's Aide(Reds Vest) HH Agency: [facility] (now [facility])   Social Determinants of Health (SDOH) Interventions     Readmission Risk Interventions Readmission Risk Prevention Plan 05/30/2018  Transportation Screening Complete  PCP or Specialist Appt within 3-5 Days Complete  HRI or Home Care Consult Complete  Social Work Consult for Recovery Care Planning/Counseling Complete  Palliative Care Screening Not Applicable  Medication Review Ocean[employer]) Complete

## 2018-06-01 NOTE — Evaluation (Signed)
Physical Therapy Evaluation Patient Details Name: [name] [name] MRN: [id number] DOB: 1960-05-01 Today's Date: 06/01/2018   History of Present Illness  58 y.o. male with a known history of per below presents emergency room with 3-day history of watery eyes, nonproductive cough, fever, nausea, emesis, ill feeling, denies sick contacts, in emergency room patient was noted to have A. fib with RVR with heart rate in the 150s  Clinical Impression  Pt did relatively well with PT exam and showed ability to be able to return home safely.  He is not at his baseline and would benefit from continued PT in the hospital, however he is not interested in HHPT on discharge and reports he has some help available (pt reports losing his wife 2 weeks ago and is dealing with this).  Pt needed cuing during ambulation to slow, breath deeply and insure appropriate walker use (he uses rollator at home) - he did have increased HR with the effort (to 120s)  but ultimately showed ability to be safe at home.    Follow Up Recommendations No PT follow up    Equipment Recommendations  None recommended by PT    Recommendations for Other Services       Precautions / Restrictions Precautions Precautions: None Restrictions Weight Bearing Restrictions: No      Mobility  Bed Mobility Overal bed mobility: (in recliner on arrival, not tested but likely independent)                Transfers Overall transfer level: Independent Equipment used: None             General transfer comment: Pt able to rise w/o assist, did not need AD to maintain baalnce  Ambulation/Gait Ambulation/Gait assistance: Modified independent (Device/Increase time) Gait Distance (Feet): 200 Feet Assistive device: Rolling walker (2 wheeled)       General Gait Details: Pt was able to ambulate around the nurse's station with consistent cadence, he did have some fatigue though on room air O2 did remain in the mid/upper 90s.     Stairs            Wheelchair Mobility    Modified Rankin (Stroke Patients Only)       Balance Overall balance assessment: Modified Independent                                           Pertinent Vitals/Pain Pain Assessment: (chronic pain, extensive history of fxs, etc; head ache )    Home Living Family/patient expects to be discharged to:: Private residence Living Arrangements: Alone                    Prior Function Level of Independence: Independent with assistive device(s)         Comments: Pt reports he has rollator, but for longer distances uses scooter     Hand Dominance        Extremity/Trunk Assessment   Upper Extremity Assessment Upper Extremity Assessment: Overall WFL for tasks assessed    Lower Extremity Assessment Lower Extremity Assessment: Overall WFL for tasks assessed       Communication   Communication: No difficulties  Cognition Arousal/Alertness: Awake/alert Behavior During Therapy: WFL for tasks assessed/performed Overall Cognitive Status: Within Functional Limits for tasks assessed  General Comments      Exercises     Assessment/Plan    PT Assessment Patient needs continued PT services  PT Problem List Decreased activity tolerance;Decreased safety awareness;Decreased knowledge of use of DME;Decreased strength;Cardiopulmonary status limiting activity;Obesity       PT Treatment Interventions DME instruction;Gait training;Functional mobility training;Therapeutic activities;Therapeutic exercise;Neuromuscular re-education;Balance training;Patient/family education    PT Goals (Current goals can be found in the Care Plan section)  Acute Rehab PT Goals Patient Stated Goal: Go home this afternoon PT Goal Formulation: With patient Time For Goal Achievement: 06/15/18 Potential to Achieve Goals: Good    Frequency Min 2X/week   Barriers to  discharge        Co-evaluation               AM-PAC PT "6 Clicks" Mobility  Outcome Measure Help needed turning from your back to your side while in a flat bed without using bedrails?: None Help needed moving from lying on your back to sitting on the side of a flat bed without using bedrails?: None Help needed moving to and from a bed to a chair (including a wheelchair)?: None Help needed standing up from a chair using your arms (e.g., wheelchair or bedside chair)?: None Help needed to walk in hospital room?: A Little Help needed climbing 3-5 steps with a railing? : A Little 6 Click Score: 22    End of Session Equipment Utilized During Treatment: Gait belt Activity Tolerance: Patient tolerated treatment well;Patient limited by fatigue Patient left: with call bell/phone within reach;in chair Nurse Communication: Mobility status PT Visit Diagnosis: Muscle weakness (generalized) (M62.81);Difficulty in walking, not elsewhere classified (R26.2)    Time: 2248-2500 PT Time Calculation (min) (ACUTE ONLY): 29 min   Charges:   PT Evaluation $PT Eval Low Complexity: 1 Low PT Treatments $Gait Training: 8-22 mins        Malachi Pro, DPT 06/01/2018, 1:12 PM

## 2018-06-01 NOTE — Progress Notes (Signed)
Progress Note  Patient Name: [name] [name] Date of Encounter: 06/01/2018  Primary Cardiologist: End  Subjective   No reported chest pain, palpitations, or feeling of racing HR.  Breathing improved today. Discussed that he ambulated yesterday with decrease in oxygen saturation, which qualified him for home oxygen.  Feels nauseated today but still eating breakfast. Has had a bowel movement.  He requested that he be able to increase his fluids again with desire to drink milk. Encouraged he continue to limit fluids and make diet modifications as previously discussed.   Inpatient Medications    Scheduled Meds:  amiodarone  400 mg Oral BID   budesonide (PULMICORT) nebulizer solution  0.5 mg Nebulization BID   colchicine  0.6 mg Oral BID   fluticasone  2 spray Each Nare BID   gabapentin  300 mg Oral TID   ketotifen  1 drop Both Eyes BID   lisinopril  5 mg Oral Daily   loratadine  10 mg Oral Daily   meloxicam  15 mg Oral Daily   metoprolol succinate  50 mg Oral BID   potassium chloride SA  40 mEq Oral BID   rivaroxaban  20 mg Oral QPM   sodium chloride flush  3 mL Intravenous Q12H   sodium chloride flush  3 mL Intravenous Q12H   spironolactone  25 mg Oral Daily   torsemide  40 mg Oral Daily   Continuous Infusions:  sodium chloride     ondansetron (ZOFRAN) IV     PRN Meds: sodium chloride, acetaminophen **OR** acetaminophen, cyclobenzaprine, HYDROcodone-acetaminophen, Ipratropium-Albuterol, metoprolol tartrate, nitroGLYCERIN, ondansetron **OR** [DISCONTINUED] ondansetron (ZOFRAN) IV, polyethylene glycol, sodium chloride flush, traZODone   Vital Signs    Vitals:   05/31/18 2031 05/31/18 2138 06/01/18 0436 06/01/18 0443  BP:  (!) 117/95  101/66  Pulse:  (!) 154  84  Resp:    16  Temp:    98.3 F (36.8 C)  TempSrc:    Oral  SpO2: 91%   97%  Weight:   (!) 173 kg   Height:        Intake/Output Summary (Last 24 hours) at 06/01/2018 0848 Last data filed  at 06/01/2018 0804 Gross per 24 hour  Intake 1080 ml  Output 2525 ml  Net -1445 ml   Last 3 Weights 06/01/2018 05/31/2018 05/30/2018  Weight (lbs) 381 lb 6.4 oz 378 lb 5 oz 373 lb 6.4 oz  Weight (kg) 173.002 kg 171.6 kg 169.373 kg      Telemetry    Atrial fibrillation (ventricular rates 70- low 100s bpm)- Personally Reviewed  ECG    No new tracings - Personally Reviewed  Physical Exam   GEN: No acute distress.  Sitting in chair Neck: Unable to assess JVP due to body habitus.  Cardiac:  Irregularly irregular, distant heart sounds.  Irregularly irregular without murmurs. Respiratory: Clear lung sounds bilaterally.  No wheezes or crackles. GI: Soft, nontender, non-distended  MS:  Swollen lower extremities Ace wraps hanging around his ankles, no significant pitting edema Open wound on left lateral calf; No deformity. Neuro:  Nonfocal  Psych: Normal affect   Labs    Chemistry Recent Labs  Lab 05/29/18 0748 05/30/18 0328 05/31/18 0421  NA 137 136 133*  K 2.9* 3.8 4.2  CL 103 95* 94*  CO2 23 29 29   GLUCOSE 144* 108* 105*  BUN 6 10 19   CREATININE 0.84 0.98 1.26*  CALCIUM 8.1* 8.5* 8.9  PROT 7.3  --   --  ALBUMIN 3.2*  --   --   AST 23  --   --   ALT 12  --   --   ALKPHOS 85  --   --   BILITOT 0.8  --   --   GFRNONAA >60 >60 >60  GFRAA >60 >60 >60  ANIONGAP 11 12 10      Hematology Recent Labs  Lab 05/29/18 0748  WBC 5.0  RBC 4.26  HGB 10.8*  HCT 35.0*  MCV 82.2  MCH 25.4*  MCHC 30.9  RDW 17.8*  PLT 194    Cardiac EnzymesNo results for input(s): TROPONINI in the last 168 hours. No results for input(s): TROPIPOC in the last 168 hours.   BNP Recent Labs  Lab 05/29/18 0748  BNP 289.0*     DDimer No results for input(s): DDIMER in the last 168 hours.   Radiology    No results found.  Cardiac Studies  Echo (03/24/2018): Technically difficult study. Mildly dilated LV with LVEF 25-30%.  Moderate LAE.  Patient Profile     58 y.o. male with chronic  systolic heart failure and recurrent persistent atrial fibrillation following recent cardioversion, admitted with acute hypoxic respiratory failure in the setting of suspected viral respiratory infection and atrial fibrillation with rapid ventricular response.  Assessment & Plan    Persistent atrial fibrillation -- Current Afib. Rates increase with exertion as above, controlled at rest.  ---Continue amiodarone 400 mg BID (10 gm load), then 200 mg daily thereafter.  ---Recommend outpatient DCCV as remains in Afib at this time ---Continue metoprolol succinate 50 mg BID ---Continue rivaroxaban 20 mg daily for chronic anticoagulation.  Chronic systolic heart failure --SOB improving s/p aggressive diuresis in the hospital. SOB d/t volume retention with patient admitting to heavy fluid intake at home. Likely SOB is multifactorial in setting of known COPD, OSA. Qualified for home oxygen this hospitalization with O2 saturations dropping with ambulation. Volume status continues to be difficult to assess d/t morbid obesity.  --No labs this AM. Ordered BMET. Hold torsemide and lisinopril for today and pending BMET. Per MD, and pending confirmation of stable renal function and electrolytes, tentative plan to send home with PTA torsemide and extra half dose torsemide after lunch for calf swelling / tightness. Avoid metolazone.  --Pending stable renal function, discharge on metoprolol, lisinopril, torsemide, and spironolactone. Long discussion re: diet changes and reduced fluid intake at home, including milk.   Respiratory distress, infection --COVID-19 testing negative. Per IM.    Morbid obesity  --Dietary changes recommended. Lifestyle changes. Long history of secondhand smoke as a child parents smoked 2 packs a day each   For questions or updates, please contact [facility] Please consult [email or web address] for contact info under   Signed, Lennon Alstrom, PA-C  06/01/2018, 8:48 AM

## 2018-06-01 NOTE — Discharge Summary (Signed)
[facility] [place] at [facility]   PATIENT NAME: [name] [name]    MR#:  [id number]  DATE OF BIRTH:  1960-07-04  DATE OF ADMISSION:  05/29/2018 ADMITTING PHYSICIAN: Evelena Asa Salary, MD  DATE OF DISCHARGE: No discharge date for patient encounter.  PRIMARY CARE PHYSICIAN: Patient, No Pcp Per    ADMISSION DIAGNOSIS:  Hypoxia [R09.02] Cystitis [N30.90] Non-intractable vomiting with nausea, unspecified vomiting type [R11.2]  DISCHARGE DIAGNOSIS:  Active Problems:   Respiratory failure (HCC)   SECONDARY DIAGNOSIS:   Past Medical History:  Diagnosis Date  . Asthma   . COPD (chronic obstructive pulmonary disease) (HCC)   . HFrEF (heart failure with reduced ejection fraction) (HCC)    a. 03/2018 Echo: EF 25-30%, diff HK. Mod dil LA.  [name] Persistent atrial fibrillation    a. 03/2018 s/p DCCV; b. CHA2DS2VASc = 1-->Xarelto; c. 05/2018 recurrent afib-->Amio initiated;   . Sleep apnea     HOSPITAL COURSE:  *Acute hypoxic respiratory failure Resolved  most likely secondary to acute on chronic A. fib with RVR, extreme morbid obesity, obstructive sleep apnea, and probable acute viral upper respiratory tract infection Successfully weaned off oxygen   *Acute A. fib with RVR Stable Rate controlled Most likely exacerbated by viral upper respiratory tract infection and inability to tolerate rate control agents Continued beta-blocker therapy, cardiology did see patient-started on amiodarone/plans for outpatient cardioversion, on Xarelto  *Acute probable viral respiratory tract infection with nausea, emesis, rhinorrhea, nonproductive cough Resolving Influenza/coronavirus/respiratory panel negative Successfully weaned off oxygen  *Acute on chronic allergic rhinitis Flonase and Claritin  *Chronic systolic congestive heart failure without exacerbation with cardiomyopathy Ejection fraction 25-30% on most recent echocardiogram from February  2020 Stable Continue lisinopril, torsemide 40 mg daily, received Zaroxolyn x 1, on Xarelto, spironolactone, cardiology  did follow patient while in house     *Chronic obstructive sleep apnea Stable CPAP at bedtime/as needed  *COPD/asthma without exacerbation Stable Breathing treatments PRN  *Chronic extreme morbid obesity Most likely secondary to excess calories Lifestyle modification recommended   *Chronic osteoarthritis/chronic pain syndrome Treated with Neurontin, Flexeril as needed, Mobic, gout diet  *Chronic gout without flare Gout diet   DISCHARGE CONDITIONS:   stable  CONSULTS OBTAINED:  Treatment Team:  [name], MD  DRUG ALLERGIES:  No Known Allergies  DISCHARGE MEDICATIONS:   Allergies as of 06/01/2018   No Known Allergies     Medication List    STOP taking these medications   furosemide 40 MG tablet Commonly known as:  LASIX     TAKE these medications   albuterol 108 (90 Base) MCG/ACT inhaler Commonly known as:  PROVENTIL HFA;VENTOLIN HFA Inhale 2 puffs into the lungs every 6 (six) hours as needed for wheezing or shortness of breath.   amiodarone 200 MG tablet Commonly known as:  PACERONE Take 1 tablet (200 mg total) by mouth as directed. 2 tablets p.o. twice daily for 4 days, then 1 tablet p.o. daily thereafter   colchicine 0.6 MG tablet Take 1 tablet (0.6 mg total) by mouth daily.   fluticasone 50 MCG/ACT nasal spray Commonly known as:  FLONASE Place 2 sprays into both nostrils 2 (two) times daily.   gabapentin 300 MG capsule Commonly known as:  NEURONTIN Take 1 capsule (300 mg total) by mouth 3 (three) times daily.   lisinopril 5 MG tablet Commonly known as:  PRINIVIL,ZESTRIL Take 2.5 mg by mouth daily.   loratadine 10 MG tablet Commonly known as:  CLARITIN Take 1 tablet (  10 mg total) by mouth daily. Start taking on:  June 02, 2018   meloxicam 15 MG tablet Commonly known as:  MOBIC Take 1 tablet (15 mg total) by  mouth daily. Start taking on:  June 02, 2018   metoprolol succinate 50 MG 24 hr tablet Commonly known as:  TOPROL-XL Take 1 tablet (50 mg total) by mouth 2 (two) times daily. Take with or immediately following a meal.   nitroGLYCERIN 0.4 MG SL tablet Commonly known as:  NITROSTAT Place 1 tablet (0.4 mg total) under the tongue every 5 (five) minutes as needed for chest pain.   potassium chloride SA 20 MEQ tablet Commonly known as:  K-DUR,KLOR-CON Take 1 tablet (20 mEq total) by mouth 2 (two) times daily.   rivaroxaban 20 MG Tabs tablet Commonly known as:  XARELTO Take 1 tablet (20 mg total) by mouth daily. What changed:  when to take this   spironolactone 25 MG tablet Commonly known as:  ALDACTONE Take 1 tablet (25 mg total) by mouth daily.   torsemide 20 MG tablet Commonly known as:  DEMADEX Take 2 tablets (40 mg total) by mouth daily. Start taking on:  June 02, 2018 What changed:    medication strength  how much to take   traZODone 150 MG tablet Commonly known as:  DESYREL Take 1 tablet (150 mg total) by mouth at bedtime as needed for sleep.        DISCHARGE INSTRUCTIONS:  If you experience worsening of your admission symptoms, develop shortness of breath, life threatening emergency, suicidal or homicidal thoughts you must seek medical attention immediately by calling 911 or calling your MD immediately  if symptoms less severe.  You Must read complete instructions/literature along with all the possible adverse reactions/side effects for all the Medicines you take and that have been prescribed to you. Take any new Medicines after you have completely understood and accept all the possible adverse reactions/side effects.   Please note  You were cared for by a hospitalist during your hospital stay. If you have any questions about your discharge medications or the care you received while you were in the hospital after you are discharged, you can call the unit and asked  to speak with the hospitalist on call if the hospitalist that took care of you is not available. Once you are discharged, your primary care physician will handle any further medical issues. Please note that NO REFILLS for any discharge medications will be authorized once you are discharged, as it is imperative that you return to your primary care physician (or establish a relationship with a primary care physician if you do not have one) for your aftercare needs so that they can reassess your need for medications and monitor your lab values.    Today   CHIEF COMPLAINT:   Chief Complaint  Patient presents with  . Shortness of Breath    HISTORY OF PRESENT ILLNESS:  58 y.o. male with a known history of per below presents emergency room with 3-day history of watery eyes, nonproductive cough, fever, nausea, emesis, ill feeling, denies sick contacts, in emergency room patient was noted to have A. fib with RVR with heart rate in the 150s-treated with metoprolol, suspected due to inability to tolerate p.o. medications while at home, potassium was 2.9, urinalysis noted for moderate leukocyte esterase, chest x-ray noted for cardiomegaly, influenza test negative, respiratory panel/coronavirus testing sent, hospitalist asked to admit, patient evaluated in the emergency room, in no apparent distress, resting comfortably  in bed, patient is now being admitted for acute hypoxic respiratory failure most likely secondary to acute A. fib with RVR and probable acute viral upper respiratory tract infection, concern for coronavirus infection.  VITAL SIGNS:  Blood pressure (!) 115/52, pulse 67, temperature 98.3 F (36.8 C), temperature source Oral, resp. rate 18, height  (1.727 m), weight (!) 173 kg, SpO2 98 %.  I/O:    Intake/Output Summary (Last 24 hours) at 06/01/2018 1205 Last data filed at 06/01/2018 0900 Gross per 24 hour  Intake 960 ml  Output 1975 ml  Net -1015 ml    PHYSICAL EXAMINATION:   GENERAL:  58 y.o.-year-old patient lying in the bed with no acute distress.  EYES: Pupils equal, round, reactive to light and accommodation. No scleral icterus. Extraocular muscles intact.  HEENT: Head atraumatic, normocephalic. Oropharynx and nasopharynx clear.  NECK:  Supple, no jugular venous distention. No thyroid enlargement, no tenderness.  LUNGS: Normal breath sounds bilaterally, no wheezing, rales,rhonchi or crepitation. No use of accessory muscles of respiration.  CARDIOVASCULAR: S1, S2 normal. No murmurs, rubs, or gallops.  ABDOMEN: Soft, non-tender, non-distended. Bowel sounds present. No organomegaly or mass.  EXTREMITIES: No pedal edema, cyanosis, or clubbing.  NEUROLOGIC: Cranial nerves II through XII are intact. Muscle strength 5/5 in all extremities. Sensation intact. Gait not checked.  PSYCHIATRIC: The patient is alert and oriented x 3.  SKIN: No obvious rash, lesion, or ulcer.   DATA REVIEW:   CBC Recent Labs  Lab 05/29/18 0748  WBC 5.0  HGB 10.8*  HCT 35.0*  PLT 194    Chemistries  Recent Labs  Lab 05/29/18 0748  06/01/18 0918  NA 137   < > 129*  K 2.9*   < > 3.8  CL 103   < > 90*  CO2 23   < > 30  GLUCOSE 144*   < > 129*  BUN 6   < > 26*  CREATININE 0.84   < > 1.37*  CALCIUM 8.1*   < > 8.4*  MG 1.7  --   --   AST 23  --   --   ALT 12  --   --   ALKPHOS 85  --   --   BILITOT 0.8  --   --    < > = values in this interval not displayed.    Cardiac Enzymes No results for input(s): TROPONINI in the last 168 hours.  Microbiology Results  Results for orders placed or performed during the hospital encounter of 05/29/18  SARS Coronavirus 2 [facility] order, Performed in [facility] hospital lab)     Status: None   Collection Time: 05/29/18  7:48 AM  Result Value Ref Range Status   SARS Coronavirus 2 NEGATIVE NEGATIVE Final    Comment: (NOTE) If result is NEGATIVE SARS-CoV-2 target nucleic acids are NOT DETECTED. The SARS-CoV-2 RNA is generally  detectable in upper and lower  respiratory specimens during the acute phase of infection. The lowest  concentration of SARS-CoV-2 viral copies this assay can detect is 250  copies / mL. A negative result does not preclude SARS-CoV-2 infection  and should not be used as the sole basis for treatment or other  patient management decisions.  A negative result may occur with  improper specimen collection / handling, submission of specimen other  than nasopharyngeal swab, presence of viral mutation(s) within the  areas targeted by this assay, and inadequate number of viral copies  (<250 copies / mL). A  negative result must be combined with clinical  observations, patient history, and epidemiological information. If result is POSITIVE SARS-CoV-2 target nucleic acids are DETECTED. The SARS-CoV-2 RNA is generally detectable in upper and lower  respiratory specimens dur ing the acute phase of infection.  Positive  results are indicative of active infection with SARS-CoV-2.  Clinical  correlation with patient history and other diagnostic information is  necessary to determine patient infection status.  Positive results do  not rule out bacterial infection or co-infection with other viruses. If result is PRESUMPTIVE POSTIVE SARS-CoV-2 nucleic acids MAY BE PRESENT.   A presumptive positive result was obtained on the submitted specimen  and confirmed on repeat testing.  While 2019 novel coronavirus  (SARS-CoV-2) nucleic acids may be present in the submitted sample  additional confirmatory testing may be necessary for epidemiological  and / or clinical management purposes  to differentiate between  SARS-CoV-2 and other Sarbecovirus currently known to infect humans.  If clinically indicated additional testing with an alternate test  methodology [phone]) is advised. The SARS-CoV-2 RNA is generally  detectable in upper and lower respiratory sp ecimens during the acute  phase of infection. The  expected result is Negative. Fact Sheet for Patients:  BoilerBrush.com.cy Fact Sheet for Healthcare Providers: https://pope.com/ This test is not yet approved or cleared by the [place] FDA and has been authorized for detection and/or diagnosis of SARS-CoV-2 by FDA under an Emergency Use Authorization (EUA).  This EUA will remain in effect (meaning this test can be used) for the duration of the COVID-19 declaration under Section 564(b)(1) of the Act, 21 U.S.C. section 360bbb-3(b)(1), unless the authorization is terminated or revoked sooner. Performed at [facility], [address] [place], [place] [address]   Respiratory Panel by PCR     Status: None   Collection Time: 05/29/18  7:48 AM  Result Value Ref Range Status   Adenovirus NOT DETECTED NOT DETECTED Final   Coronavirus 229E NOT DETECTED NOT DETECTED Final    Comment: (NOTE) The Coronavirus on the Respiratory Panel, DOES NOT test for the novel  Coronavirus (2019 nCoV)    Coronavirus HKU1 NOT DETECTED NOT DETECTED Final   Coronavirus NL63 NOT DETECTED NOT DETECTED Final   Coronavirus OC43 NOT DETECTED NOT DETECTED Final   Metapneumovirus NOT DETECTED NOT DETECTED Final   Rhinovirus / Enterovirus NOT DETECTED NOT DETECTED Final   Influenza A NOT DETECTED NOT DETECTED Final   Influenza B NOT DETECTED NOT DETECTED Final   Parainfluenza Virus 1 NOT DETECTED NOT DETECTED Final   Parainfluenza Virus 2 NOT DETECTED NOT DETECTED Final   Parainfluenza Virus 3 NOT DETECTED NOT DETECTED Final   Parainfluenza Virus 4 NOT DETECTED NOT DETECTED Final   Respiratory Syncytial Virus NOT DETECTED NOT DETECTED Final   Bordetella pertussis NOT DETECTED NOT DETECTED Final   Chlamydophila pneumoniae NOT DETECTED NOT DETECTED Final   Mycoplasma pneumoniae NOT DETECTED NOT DETECTED Final    Comment: Performed at [facility], [address], [place], [place] [address]     RADIOLOGY:  No results found.  EKG:   Orders placed or performed during the hospital encounter of 05/29/18  . EKG 12-Lead  . EKG 12-Lead  . ED EKG  . ED EKG  . EKG 12-Lead  . EKG 12-Lead  . EKG 12-Lead  . EKG 12-Lead  . EKG 12-Lead  . EKG 12-Lead      Management plans discussed with the patient, family and they are  in agreement.  CODE STATUS:     Code Status Orders  (From admission, onward)         Start     Ordered   05/29/18 1454  Full code  Continuous     05/29/18 1453        Code Status History    Date Active Date Inactive Code Status Order ID Comments User Context   03/17/2018 0042 03/25/2018 1554 Full Code [id number]  Altamese Dilling, MD ED   02/24/2018 0725 02/27/2018 1555 Full Code [id number]  Oralia Manis, MD Inpatient   01/27/2018 1345 01/29/2018 1406 Full Code [id number]  Alford Highland, MD ED   01/17/2018 1330 01/19/2018 1818 Partial Code [id number]  Milagros Loll, MD Inpatient   01/16/2018 1427 01/17/2018 1330 Full Code [id number]  Auburn Bilberry, MD ED      TOTAL TIME TAKING CARE OF THIS PATIENT: .    Evelena Asa Salary M.D on 06/01/2018 at 12:05 PM  Between 7am to 6pm - Pager - [phone]  After 6pm go to [email or web address] - password [email or web address]  [facility] [place]  Office  [phone]  CC: Primary care physician; Patient, No Pcp Per   Note: This dictation was prepared with Dragon dictation along with smaller phrase technology. Any transcriptional errors that result from this process are unintentional.

## 2018-06-03 DIAGNOSIS — I872 Venous insufficiency (chronic) (peripheral): Secondary | ICD-10-CM | POA: Diagnosis not present

## 2018-06-05 ENCOUNTER — Ambulatory Visit: Payer: Medicaid Other | Admitting: Nurse Practitioner

## 2018-06-06 ENCOUNTER — Ambulatory Visit (INDEPENDENT_AMBULATORY_CARE_PROVIDER_SITE_OTHER): Payer: Medicaid Other | Admitting: Family Medicine

## 2018-06-06 ENCOUNTER — Other Ambulatory Visit: Payer: Self-pay

## 2018-06-06 ENCOUNTER — Telehealth: Payer: Self-pay

## 2018-06-06 ENCOUNTER — Encounter: Payer: Self-pay | Admitting: Family Medicine

## 2018-06-06 VITALS — Ht 68.0 in | Wt 380.0 lb

## 2018-06-06 DIAGNOSIS — J9601 Acute respiratory failure with hypoxia: Secondary | ICD-10-CM

## 2018-06-06 DIAGNOSIS — E119 Type 2 diabetes mellitus without complications: Secondary | ICD-10-CM

## 2018-06-06 DIAGNOSIS — M1A9XX0 Chronic gout, unspecified, without tophus (tophi): Secondary | ICD-10-CM | POA: Diagnosis not present

## 2018-06-06 DIAGNOSIS — F4321 Adjustment disorder with depressed mood: Secondary | ICD-10-CM

## 2018-06-06 DIAGNOSIS — G894 Chronic pain syndrome: Secondary | ICD-10-CM | POA: Diagnosis not present

## 2018-06-06 DIAGNOSIS — G8929 Other chronic pain: Secondary | ICD-10-CM | POA: Insufficient documentation

## 2018-06-06 DIAGNOSIS — I10 Essential (primary) hypertension: Secondary | ICD-10-CM | POA: Insufficient documentation

## 2018-06-06 DIAGNOSIS — I5023 Acute on chronic systolic (congestive) heart failure: Secondary | ICD-10-CM | POA: Diagnosis not present

## 2018-06-06 DIAGNOSIS — L97911 Non-pressure chronic ulcer of unspecified part of right lower leg limited to breakdown of skin: Secondary | ICD-10-CM

## 2018-06-06 DIAGNOSIS — M109 Gout, unspecified: Secondary | ICD-10-CM | POA: Insufficient documentation

## 2018-06-06 DIAGNOSIS — Z1159 Encounter for screening for other viral diseases: Secondary | ICD-10-CM | POA: Diagnosis not present

## 2018-06-06 DIAGNOSIS — I4819 Other persistent atrial fibrillation: Secondary | ICD-10-CM

## 2018-06-06 DIAGNOSIS — R3911 Hesitancy of micturition: Secondary | ICD-10-CM | POA: Diagnosis not present

## 2018-06-06 DIAGNOSIS — E1169 Type 2 diabetes mellitus with other specified complication: Secondary | ICD-10-CM | POA: Insufficient documentation

## 2018-06-06 DIAGNOSIS — J410 Simple chronic bronchitis: Secondary | ICD-10-CM | POA: Diagnosis not present

## 2018-06-06 DIAGNOSIS — L97921 Non-pressure chronic ulcer of unspecified part of left lower leg limited to breakdown of skin: Secondary | ICD-10-CM | POA: Insufficient documentation

## 2018-06-06 DIAGNOSIS — G4733 Obstructive sleep apnea (adult) (pediatric): Secondary | ICD-10-CM

## 2018-06-06 MED ORDER — POTASSIUM CHLORIDE CRYS ER 20 MEQ PO TBCR: 20.0000 meq | EXTENDED_RELEASE_TABLET | Freq: Two times a day (BID) | ORAL | 3 refills | Status: DC

## 2018-06-06 MED ORDER — TRAZODONE HCL 150 MG PO TABS: 150.0000 mg | ORAL_TABLET | Freq: Every evening | ORAL | 2 refills | Status: DC | PRN

## 2018-06-06 MED ORDER — METOPROLOL SUCCINATE ER 50 MG PO TB24: 50.0000 mg | ORAL_TABLET | Freq: Two times a day (BID) | ORAL | 0 refills | Status: DC

## 2018-06-06 MED ORDER — LISINOPRIL 5 MG PO TABS: 2.5000 mg | ORAL_TABLET | Freq: Every day | ORAL | 3 refills | Status: DC

## 2018-06-06 MED ORDER — ATORVASTATIN CALCIUM 40 MG PO TABS: 40.0000 mg | ORAL_TABLET | Freq: Every day | ORAL | 3 refills | Status: DC

## 2018-06-06 MED ORDER — BUDESONIDE-FORMOTEROL FUMARATE 160-4.5 MCG/ACT IN AERO: 2.0000 | INHALATION_SPRAY | Freq: Two times a day (BID) | RESPIRATORY_TRACT | 3 refills | Status: DC

## 2018-06-06 MED ORDER — SPIRONOLACTONE 25 MG PO TABS: 25.0000 mg | ORAL_TABLET | Freq: Every day | ORAL | 3 refills | Status: DC

## 2018-06-06 MED ORDER — COLCHICINE 0.6 MG PO TABS: 0.6000 mg | ORAL_TABLET | Freq: Every day | ORAL | 3 refills | Status: DC

## 2018-06-06 NOTE — Assessment & Plan Note (Signed)
Unable to observe today. Home health nurse is helping and going to get him UNNA boots- will get him into wound care for further evaluation and treatment. Referral generated today.

## 2018-06-06 NOTE — Assessment & Plan Note (Signed)
Breathing doing better. SOB on exertion, likely due in part to morbid obesity and CHF. Has had COPD exacerbations previously. Will start symbicort and continue to monitor. Will get him back in with the CHF clinic.

## 2018-06-06 NOTE — Assessment & Plan Note (Signed)
To see Dr. Okey Dupre in 2 days. Will get him hooked back in with the heart failure clinic. Referral generated today.

## 2018-06-06 NOTE — Assessment & Plan Note (Addendum)
In acute flare. Was supposed to be currently on cochicine, but didn't get it from the pharmacy. New Rx sent over. Will check uric acid and consider allopurinol. Await results. Call with any concerns.

## 2018-06-06 NOTE — Progress Notes (Signed)
Virtual Visit via Video Note   This visit type was conducted due to national recommendations for restrictions regarding the COVID-19 Pandemic (e.g. social distancing) in an effort to limit this patient's exposure and mitigate transmission in our community.  Due to his co-morbid illnesses, this patient is at least at moderate risk for complications without adequate follow up.  This format is felt to be most appropriate for this patient at this time.  All issues noted in this document were discussed and addressed.  A limited physical exam was performed with this format.  Please refer to the patient's chart for his consent to telehealth for [facility]   Evaluation Performed:  Follow-up visit  Date:  06/08/2018   ID:  [name] [name], DOB 1960/03/28, MRN [id number]  Patient Location: Home Provider Location: Home  PCP:  Dorcas Carrow, DO  Cardiologist:  [name], MD Electrophysiologist:  None   Chief Complaint: Hospital follow-up  History of Present Illness:    [name] [name] is a 58 y.o. male with chronic systolic heart failure, persistent atrial fibrillation, obstructive sleep apnea, and morbid obesity.  We are speaking today for follow-up of his heart failure and atrial fibrillation.  He was originally admitted with acute on chronic systolic heart failure in the setting of atrial fibrillation in February.  He underwent cardioversion but failed to follow-up after discharge.  He was readmitted earlier this month with chest pain and shortness of breath in the setting of recurrent atrial fibrillation and concern for acute viral infection.  He was COVID-19 negative.  He was rate controlled and started on amiodarone load.  Given good heart rate control at rest, repeat cardioversion was deferred pending adequate amiodarone load.  He was maintained on rivaroxaban.  He was discharged on torsemide 40 mg daily as well as spironolactone 25 mg daily, lisinopril 2.5 mg daily, and metoprolol  succinate 50 mg twice daily.  Today, [name] [name] reports that he feels feverish (does not have a thermometer available).  He also notes pain in his left knee, worse than baseline.  He reports chronic arthralgias involving multiple joints, for which he uses BC powders with modest relief.  He has a history of gout but states that this feels different than 8 gout flare.  He has not had any chest pain but notices palpitations whenever he moves around.  Exertional dyspnea persists when he walks around his house, though it is gradually improving.  He does not have any significant dyspnea at rest nor orthopnea.  He remains on CPAP.  Leg edema has improved, especially since the Unna boots were placed by home health.  His weight is down to 380 pounds with the Unna boots in place.  [name] [name] is tolerating his medications well.  He remains on amiodarone 200 mg twice daily.  He is also alternating torsemide 40 mg daily and spironolactone 25 mg daily, though they were both prescribed to be taken daily.  He remains compliant with rivaroxaban but notes that he experiences occasional bright red blood per rectum.  This has been a longstanding problem for him and prompted colonoscopy about a year ago.  He reports that hemorrhoids were found.  Bleeding was typically only present in the past with bowel movements, though he now occasionally notes a small amount of blood in his underwear even when he has not had a BM.  He felt lightheaded when he first came home from the hospital last week but has not had any further lightheadedness.  He remains  unsteady on his feet, especially when standing for extended periods.  He is scheduled to begin home physical therapy next week, though he wonders if his heart will be able to tolerate this.  The patient does have symptoms concerning for COVID-19 infection (fever, chills, cough, or new shortness of breath).    Past Medical History:  Diagnosis Date   Asthma    Brain damage    COPD  (chronic obstructive pulmonary disease) (HCC)    HFrEF (heart failure with reduced ejection fraction) (HCC)    a. 03/2018 Echo: EF 25-30%, diff HK. Mod dil LA.   Persistent atrial fibrillation    a. 03/2018 s/p DCCV; b. CHA2DS2VASc = 1-->Xarelto; c. 05/2018 recurrent afib-->Amio initiated;    Sleep apnea    Past Surgical History:  Procedure Laterality Date   CARDIOVERSION N/A 03/24/2018   Procedure: CARDIOVERSION;  Surgeon: Antonieta Iba, MD;  Location: [facility] ORS;  Service: Cardiovascular;  Laterality: N/A;   hearnia repair     X 3- total of two surgeries   LEG SURGERY       Current Meds  Medication Sig   albuterol (PROVENTIL HFA;VENTOLIN HFA) 108 (90 Base) MCG/ACT inhaler Inhale 2 puffs into the lungs every 6 (six) hours as needed for wheezing or shortness of breath.   amiodarone (PACERONE) 200 MG tablet Take 1 tablet (200 mg total) by mouth as directed. 2 tablets p.o. twice daily for 4 days, then 1 tablet p.o. daily thereafter   atorvastatin (LIPITOR) 40 MG tablet Take 1 tablet (40 mg total) by mouth daily.   colchicine 0.6 MG tablet Take 1 tablet (0.6 mg total) by mouth daily. (Patient taking differently: Take 0.6 mg by mouth every other day. )   fluticasone (FLONASE) 50 MCG/ACT nasal spray Place 2 sprays into both nostrils 2 (two) times daily.   gabapentin (NEURONTIN) 300 MG capsule Take 1 capsule (300 mg total) by mouth 3 (three) times daily.   lisinopril (ZESTRIL) 5 MG tablet Take 0.5 tablets (2.5 mg total) by mouth daily.   loratadine (CLARITIN) 10 MG tablet Take 10 mg by mouth daily.   meloxicam (MOBIC) 15 MG tablet Take 1 tablet (15 mg total) by mouth daily.   metoprolol succinate (TOPROL-XL) 50 MG 24 hr tablet Take 1 tablet (50 mg total) by mouth 2 (two) times daily. Take with or immediately following a meal.   nitroGLYCERIN (NITROSTAT) 0.4 MG SL tablet Place 1 tablet (0.4 mg total) under the tongue every 5 (five) minutes as needed for chest pain.   potassium  chloride SA (K-DUR) 20 MEQ tablet Take 1 tablet (20 mEq total) by mouth 2 (two) times daily.   rivaroxaban (XARELTO) 20 MG TABS tablet Take 1 tablet (20 mg total) by mouth daily. (Patient taking differently: Take 20 mg by mouth every evening. )   spironolactone (ALDACTONE) 25 MG tablet Take 1 tablet (25 mg total) by mouth daily.   torsemide (DEMADEX) 20 MG tablet Take 2 tablets (40 mg total) by mouth daily. (Patient taking differently: Take 40 mg by mouth every other day. )   traZODone (DESYREL) 150 MG tablet Take 1 tablet (150 mg total) by mouth at bedtime as needed for sleep.     Allergies:   Patient has no known allergies.   Social History   Tobacco Use   Smoking status: Never Smoker   Smokeless tobacco: Never Used  Substance Use Topics   Alcohol use: Never    Frequency: Never   Drug use: Never  Family Hx: The patient's family history includes Heart attack in his maternal grandfather and maternal grandmother; Heart failure in his mother; Lung cancer in his father and mother.  ROS:   Please see the history of present illness.   All other systems reviewed and are negative.   Prior CV studies:   The following studies were reviewed today:  TTE (03/24/2018):  1. The left ventricle has severely reduced systolic function of 25-30%. The cavity size was mildly increased. There is no increased left ventricular wall thickness. Left ventricular diastology could not be evaluated due to nondiagnostic images. Left  ventricular diffuse hypokinesis.  2. Left atrial size was moderately dilated.  3. Not assessed.  4. The mitral valve was not assessed. There is mild thickening and mild calcification. Mitral valve regurgitation was not significant.  5. The tricuspid valve is not assessed. Tricuspid valve regurgitation was not assessed by color flow Doppler.  6. The aortic valve was not assessed.  7. The interatrial septum was not well visualized.  Labs/Other Tests and Data Reviewed:      EKG:  No ECG reviewed.  Recent Labs: 05/29/2018: ALT 12; B Natriuretic Peptide 289.0; Hemoglobin 10.8; Magnesium 1.7; Platelets 194; TSH 2.812 06/01/2018: BUN 26; Creatinine, Ser 1.37; Potassium 3.8; Sodium 129   Recent Lipid Panel Lab Results  Component Value Date/Time   CHOL 185 01/28/2018 05:12 AM   TRIG 48 01/28/2018 05:12 AM   HDL 81 01/28/2018 05:12 AM   CHOLHDL 2.3 01/28/2018 05:12 AM   LDLCALC 94 01/28/2018 05:12 AM    Wt Readings from Last 3 Encounters:  06/08/18 (!) 380 lb (172.4 kg)  06/06/18 (!) 380 lb (172.4 kg)  06/01/18 (!) 381 lb 6.4 oz (173 kg)     Objective:    Vital Signs:  Ht 5\' 8"  (1.727 m)    Wt (!) 380 lb (172.4 kg)    BMI 57.78 kg/m    VITAL SIGNS:  reviewed GEN:  no acute distress CARDIOVASCULAR:  Unna boots in place.  Unable to assess edema.  ASSESSMENT & PLAN:    Chronic systolic heart failure: This is presumed to be nonischemic due to reportedly negative catheterizations in the past [place].  Weight is stable.  Lower extremity edema has improved on current diuretic regimen and after placement of Unna boots.  It should be noted that [name] [name] is alternating spironolactone and torsemide, rather than taking them daily as prescribed.  Given that his symptoms have improved, we have agreed to defer changes to this regimen.  I will have him come to the office in 1 to 2 weeks so we can examine him in person and also obtain a basic metabolic panel to reassess his renal function and electrolytes.  We will continue the remainder of his evidence-based heart failure therapy with metoprolol succinate 50 mg twice daily and lisinopril 2.5 mg daily.  Persistent atrial fibrillation: [name] [name] continues to note capitation's when moving.  He was discharged still in atrial fibrillation with reasonable heart rate control at rest while completing his amiodarone load.  I will transition him to amiodarone 200 mg daily with plans to see him in the office in 1 to 2 weeks  to confirm that he remains in atrial fibrillation.  If so, we will arrange for outpatient cardioversion.  For now, [name] [name] should remain on rivaroxaban and to minimize his aspirin use (part of BC powder).  I will check a CBC when he comes in to see [place] to ensure that his  anemia has worsened.  If he has further rectal bleeding, I advised him to contact Dr. Laural Benes or come to the ED for further assessment.  Morbid obesity: BMI remains greater than 40.  [name] [name] reports that weight loss has been difficult for him due to limited mobility.  I encouraged him to participate with physical therapy and also to watch what he is eating.  We will readdress this when he returns for follow-up.  Fever: [name] [name] reports a suggestive fever, which he also reported leading up to his hospitalization earlier this month.  COVID-19 testing at that time was negative.  If fevers persist or he develops other respiratory symptoms, he should contact Dr. Laural Benes at once for further assessment.  COVID-19 Education: The signs and symptoms of COVID-19 were discussed with the patient and how to seek care for testing (follow up with PCP or arrange E-visit).  The importance of social distancing was discussed today.  Time:   Today, I have spent 15 minutes with the patient with telehealth technology discussing the above problems.  An additional 10 minutes were spent reviewing the patient's chart and documenting today's encounter.   Medication Adjustments/Labs and Tests Ordered: Current medicines are reviewed at length with the patient today.  Concerns regarding medicines are outlined above.   Tests Ordered: BMP and CBC immediately before follow-up visit in 1 to 2 weeks.  Medication Changes: Decrease amiodarone to 200 mg daily.  Disposition:  Follow up in the office in 1 to 2 weeks with labs and EKG.  Signed, [name], MD  06/08/2018 11:47 AM    [facility]

## 2018-06-06 NOTE — Assessment & Plan Note (Signed)
To be following up with cardiology on 4/23. Due to have cardioversion. On beta-blocker, amiodarone and xarelto.

## 2018-06-06 NOTE — Assessment & Plan Note (Addendum)
Was on insulin previously in [place]. A1c of 6.9 in January- will get repeat A1c and treat as needed. Already on lisinopril 2.5mg  for renal protection. Needs statin. Will start atorvastatin. Continue to monitor.

## 2018-06-06 NOTE — Assessment & Plan Note (Signed)
Will continue to monitor. Will work on trying to lose 1-2lbs per week.

## 2018-06-06 NOTE — Assessment & Plan Note (Signed)
On gabapentin and meloxicam. Will obtain previous records. Await results.

## 2018-06-06 NOTE — Telephone Encounter (Signed)
Incoming call from [place], they have the following questions: Can the colchicine be changed to capsules Colchicine interacts with amiodarone, they want to know if it is ok to continue with this medication and if the patient has any renal or hepatic impairment.  Verbal per Olevia Perches Yes to change to capsules Yes to continue the colchicine, but take it every other day and that the patient has mild renal impairment.   Pharmacy notified.

## 2018-06-06 NOTE — Assessment & Plan Note (Signed)
Will get BP in next couple of days when he comes in for labs. Was taking both furosemide and torsemide. Advised him to stop the furosemide. He will. Monitor closely.

## 2018-06-06 NOTE — Assessment & Plan Note (Signed)
Using his CPAP. Doing well on it. No concerns.

## 2018-06-06 NOTE — Patient Instructions (Signed)
Heart Failure  Heart failure means your heart has trouble pumping blood. This makes it hard for your body to work well. Heart failure is usually a long-term (chronic) condition. You must take good care of yourself and follow your treatment plan from your doctor. Follow these instructions at home: Medicines  Take over-the-counter and prescription medicines only as told by your doctor. ? Do not stop taking your medicine unless your doctor told you to do that. ? Do not skip any doses. ? Refill your prescriptions before you run out of medicine. You need your medicines every day. Eating and drinking   Eat heart-healthy foods. Talk with a diet and nutrition specialist (dietitian) to make an eating plan.  Choose foods that: ? Have no trans fat. ? Are low in saturated fat and cholesterol.  Choose healthy foods, like: ? Fresh or frozen fruits and vegetables. ? Fish. ? Low-fat (lean) meats. ? Legumes (like beans, peas, and lentils). ? Fat-free or low-fat dairy products. ? Whole-grain foods. ? High-fiber foods.  Limit salt (sodium) if told by your doctor. Ask your nutrition specialist to recommend heart-healthy seasonings.  Cook in healthy ways instead of frying. Healthy ways of cooking include: ? Roasting. ? Grilling. ? Broiling. ? Baking. ? Poaching. ? Steaming. ? Stir-frying.  Limit how much fluid you drink, if told by your doctor. Lifestyle  Do not smoke or use chewing tobacco. Do not use nicotine gum or patches before talking to your doctor.  Limit alcohol intake to no more than 1 drink a day for non-pregnant women and 2 drinks a day for men. One drink equals 12 oz of beer, 5 oz of wine, or 1 oz of hard liquor. ? Tell your doctor if you drink alcohol many times a week. ? Talk with your doctor about whether any alcohol is safe for you. ? You should stop drinking alcohol: ? If your heart has been damaged by alcohol. ? You have very bad heart failure.  Do not use illegal  drugs.  Lose weight if told by your doctor.  Do moderate physical activity if told by your doctor. Ask your doctor what activities are safe for you if: ? You are of older age (elderly). ? You have very bad heart failure. Keep track of important information  Weigh yourself every day. ? Weigh yourself every morning after you pee (urinate) and before breakfast. ? Wear the same amount of clothing each time. ? Write down your daily weight. Give your record to your doctor.  Check and write down your blood pressure as told by your doctor.  Check your pulse as told by your doctor. Dealing with heat and cold  If the weather is very hot: ? Avoid activity that takes a lot of energy. ? Use air conditioning or fans, or find a cooler place. ? Avoid caffeine. ? Avoid alcohol. ? Wear clothing that is loose-fitting, lightweight, and light-colored.  If the weather is very cold: ? Avoid activity that takes a lot of energy. ? Layer your clothes. ? Wear mittens or gloves, a hat, and a scarf when you go outside. ? Avoid alcohol. General instructions  Manage other conditions that you have as told by your doctor.  Learn to manage stress. If you need help, ask your doctor.  Plan rest periods for when you get tired.  Get education and support as needed.  Get rehab (rehabilitation) to help you stay independent and to help with everyday tasks.  Stay up to date with shots (  immunizations), especially pneumococcal and flu (influenza) shots.  Keep all follow-up visits as told by your doctor. This is important. Contact a doctor if:  You gain weight quickly.  You are more short of breath than normal.  You cannot do your normal activities.  You tire easily.  You cough more than normal, especially with activity.  You have any or more puffiness (swelling) in areas such as your hands, feet, ankles, or belly (abdomen).  You cannot sleep because it is hard to breathe.  You feel like your heart  is beating fast (palpitations).  You get dizzy or light-headed when you stand up. Get help right away if:  You have trouble breathing.  You or someone else notices a change in your awareness. This could be trouble staying awake or trouble concentrating.  You have chest pain or discomfort.  You pass out (faint). Summary  Heart failure means your heart has trouble pumping blood.  Make sure you refill your prescriptions before you run out of medicine. You need your medicines every day.  Keep records of your weight and blood pressure to give to your doctor.  Contact a doctor if you gain weight quickly. This information is not intended to replace advice given to you by your health care provider. Make sure you discuss any questions you have with your health care provider. Document Released: 11/11/2007 Document Revised: 10/26/2017 Document Reviewed: 02/24/2016 Elsevier Interactive Patient Education  2019 [employer]   Living With Heart Failure  Heart failure is a long-term (chronic) condition in which the heart cannot pump enough blood through the body. When this happens, parts of the body do not get the blood and oxygen they need. There is no cure for heart failure at this time, so it is important for you to take good care of yourself and follow the treatment plan set by your health care provider. If you are living with heart failure, there are ways to help you manage the disease. Follow these instructions at home: Living with heart failure requires you to make changes in your life. Your health care team will teach you about the changes you need to make in order to relieve your symptoms and lower your risk of going to the hospital. Follow the treatment plan as set by your health care provider. Medicines Medicines are important in reducing your heart's workload, slowing the progression of heart failure, and improving your symptoms.  Take over-the-counter and prescription medicines only  as told by your health care provider.  Do not stop taking your medicine unless your health care provider tells you to do that.  Do not skip any dose of your medicine.  Refill prescriptions before you run out of medicine. You need your medicines every day. Eating and drinking   Eat heart-healthy foods. Talk with a dietitian to make an eating plan that is right for you. ? If directed by your health care provider: ? Limit salt (sodium). Lowering your sodium intake may reduce symptoms of heart failure. Ask a dietitian to recommend heart-healthy seasonings. ? Limit your fluid intake. Fluid restriction may reduce symptoms of heart failure. ? Use low-fat cooking methods instead of frying. Low-fat methods include roasting, grilling, broiling, baking, poaching, steaming, and stir-frying. ? Choose foods that contain no trans fat and are low in saturated fat and cholesterol. Healthy choices include fresh or frozen fruits and vegetables, fish, lean meats, legumes, fat-free or low-fat dairy products, and whole-grain or high-fiber foods.  Limit alcohol intake to no more   than 1 drink a day for nonpregnant women and 2 drinks a day for men. One drink equals 12 oz of beer, 5 oz of wine, or 1 oz of hard liquor. ? Drinking more than that is harmful to your heart. Tell your health care provider if you drink alcohol several times a week. ? Talk with your health care provider about whether any level of alcohol use is safe for you. Activity   Ask your health care provider about attending cardiac rehabilitation. These programs include aerobic physical activity, which provides many benefits for your heart.  If no cardiac rehabilitation program is available, ask your health care provider what aerobic exercises are safe for you to do. Lifestyle Make the lifestyle changes recommended by your health care provider. In general:  Lose weight if your health care provider tells you to do that. Weight loss may reduce  symptoms of heart failure.  Do not use any products that contain nicotine or tobacco, such as cigarettes or e-cigarettes. If you need help quitting, ask your health care provider.  Do not use street (illegal) drugs.  Return to your normal activities as told by your health care provider. Ask your health care provider what activities are safe for you. General instructions   Make sure you weigh yourself every day to track your weight. Rapid weight gain may indicate an increase in fluid in your body and may increase the workload of your heart. ? Weigh yourself every morning. Do this after you urinate but before you eat breakfast. ? Wear the same type of clothing, without shoes, each time you weigh yourself. ? Weigh yourself on the same scale and in the same spot each time.  Living with chronic heart failure often leads to emotions such as fear, stress, anxiety, and depression. If you feel any of these emotions and need help coping, contact your health care provider. Other ways to get help include: ? Talking to friends and family members about your condition. They can give you support and guidance. Explain your symptoms to them and, if comfortable, invite them to attend appointments or rehabilitation with you. ? Joining a support group for people with chronic heart failure. Talking with other people who have the same symptoms may give you new ways of coping with your disease and your emotions.  Stay up to date with your shots (vaccines). Staying current on pneumococcal and influenza vaccines is especially important in preventing germs from attacking your airways (respiratory infections).  Keep all follow-up visits as told by your health care provider. This is important. How to recognize changes in your condition You and your family members need to know what changes to watch for in your condition. Watch for the following changes and report them to your health care provider:  Sudden weight gain.  Ask your health care provider what amount of weight gain to report.  Shortness of breath: ? Feeling short of breath while at rest, with no exercise or activity that required great effort. ? Feeling breathless with activity.  Swelling of your lower legs or ankles.  Difficulty sleeping: ? You wake up feeling short of breath. ? You have to use more pillows to raise your head in order to sleep.  Frequent, dry, hacking cough.  Loss of appetite.  Feeling more tired all the time.  Depression or feelings of sadness or hopelessness.  Bloating in the stomach. Where to find more information  Local support groups. Ask your health care provider about groups near you.    American Heart Association: www.heart.org Contact a health care provider if:  You have a rapid weight gain.  You have increasing shortness of breath that is unusual for you.  You are unable to participate in your usual physical activities.  You tire easily.  You cough more than normal, especially with physical activity.  You have any swelling or more swelling in areas such as your hands, feet, ankles, or abdomen.  You feel like your heart is beating quickly (palpitations).  You become dizzy or light-headed when you stand up. Get help right away if:  You have difficulty breathing.  You notice or your family notices a change in your awareness, such as having trouble staying awake or having difficulty with concentration.  You have pain or discomfort in your chest.  You have an episode of fainting (syncope). Summary  There is no cure for heart failure, so it is important for you to take good care of yourself and follow the treatment plan set by your health care provider.  Medicines are important in reducing your heart's workload, slowing the progression of heart failure, and improving your symptoms.  Living with chronic heart failure often leads to emotions such as fear, stress, anxiety, and depression. If  you are feeling any of these emotions and need help coping, contact your health care provider. This information is not intended to replace advice given to you by your health care provider. Make sure you discuss any questions you have with your health care provider. Document Released: 06/16/2016 Document Revised: 06/16/2016 Document Reviewed: 06/16/2016 Elsevier Interactive Patient Education  2019 [employer].  Coping With Loss, Adult People experience loss in many different ways throughout their lives. Events such as moving, changing jobs, and losing friends can create a sense of loss. The loss may be as serious as a major health change, divorce, death of a pet, or death of a loved one. All of these types of loss are likely to create a physical and emotional reaction known as grief. Grief is the result of a major change or an absence of something or someone that you count on. Grief is a normal reaction to loss. How to recognize changes A variety of factors can affect your grieving experience, including:  The nature of your loss.  Your relationship to what or whom you lost.  Your understanding of grief and how to cope with it.  Your support system. The way that you deal with your grief will affect your ability to function as you normally do. When you are grieving, you may experience:  Numbness, shock, sadness, anxiety, anger, denial, and guilt.  Thoughts about death.  Unexpected crying.  A physical sensation of emptiness in your gut.  Problems sleeping and eating.  Fatigue.  Loss of interest in normal activities.  Dreaming about or imagining seeing the person who died.  A need to remember what or whom you lost.  Difficulty thinking about anything other than your loss for a period of time.  Relief. If you have been expecting the loss for a while, you may feel a sense of relief when it happens. Where to find support To get support for coping with loss:  Ask your health care  provider for help and recommendations, such as grief counseling or therapy.  Think about joining a support group for people who are coping with loss. Follow these instructions at home:   Be patient with yourself and others. Allow the grieving process to happen, and remember that grieving takes  time. ? It is likely that you may never feel completely done with some grief. You may find a way to move on while still cherishing memories and feelings about your loss. ? Accepting your loss is a process. It can take months or longer to adjust.  Express your feelings in healthy ways, such as: ? Talking with others about your loss. It may be helpful to find others who have had a similar loss, such as a support group. ? Writing down your feelings in a journal. ? Doing physical activities to release stress and emotional energy. ? Doing creative activities like painting, sculpting, or playing or listening to music. ? Practicing resilience. This is the ability to recover and adjust after facing challenges. Reading some resources that encourage resilience may help you to learn ways to practice those behaviors.  Keep to your normal routine as much as possible. If you have trouble focusing or doing normal activities, it is acceptable to take some time away from your normal routine.  Spend time with friends and loved ones.  Eat a healthy diet, get plenty of sleep, and rest when you feel tired. Where to find more information You can find more information about coping with loss from:  American Society of Clinical Oncology: www.cancer.net  American Psychological Association: [employer] Contact a health care provider if:  Your grief is extreme and keeps getting worse.  You have ongoing grief that does not improve.  Your body shows symptoms of grief, such as illness.  You feel depressed, anxious, or lonely. Get help right away if:  You have thoughts about hurting yourself or others. If you ever feel  like you may hurt yourself or others, or have thoughts about taking your own life, get help right away. You can go to your nearest emergency department or call:  Your local emergency services (911 in the [place]  A suicide crisis helpline, such as the National Suicide Prevention Lifeline at 919 800 4657. This is open 24 hours a day. Summary  Grief is a normal part of experiencing a loss. It is the result of a major change or an absence of something or someone that you count on.  The depth of grief and the period of recovery depend on the type of loss as well as your ability to adjust to the change and process your feelings.  Processing grief requires patience and a willingness to accept your feelings and talk about your loss with people who are supportive.  It is important to find resources that work for you and to realize that we are all different when it comes to grief. There is not one single grieving process that works for everyone in the same way.  Be aware that when grief becomes extreme, it can lead to more severe issues like isolation, depression, anxiety, or suicidal thoughts. Talk with your health care provider if you have any of these issues. This information is not intended to replace advice given to you by your health care provider. Make sure you discuss any questions you have with your health care provider. Document Released: 06/17/2016 Document Revised: 06/17/2016 Document Reviewed: 06/17/2016 Elsevier Interactive Patient Education  2019 [employer].  Venous Ulcer  A venous ulcer is a shallow sore on your lower leg that is caused by poor circulation in your veins. This condition used to be called stasis ulcer. Veins have valves that help return blood to the heart. If these valves do not work properly, it can cause blood to flow backward  and to back up into the veins near the skin. When that happens, blood can pool in your lower legs. The blood can then leak out of your veins,  which can irritate your skin. This may cause a break in your skin that becomes a venous ulcer. Venous ulcer is the most common type of lower leg ulcer. You may have venous ulcers on one leg or on both legs. The area where this condition most commonly develops is around the ankles. A venous ulcer may last for a long time (chronic ulcer) or it may return repeatedly (recurrent ulcer). What are the causes? Any condition that causes poor circulation to your legs can lead to a venous ulcer. What increases the risk? This condition is more likely to develop in:  People who are 56 years of age or older.  People who are overweight.  People who are not active.  People who have had a leg ulcer in the past.  People who have clots in their lower leg veins (deep vein thrombosis).  People who have inflammation of their leg veins (phlebitis).  Women who have given birth.  People who smoke. What are the signs or symptoms? The most common symptom of this condition is an open sore near your ankle. Other symptoms may include:  Swelling.  Thickening of the skin.  Fluid leaking from the ulcer.  Bleeding.  Itching.  Pain and swelling that gets worse when you stand up and feels better when you raise your leg.  Blotchy skin.  Darkening of the skin. How is this diagnosed? Your health care provider may suspect a venous ulcer based on your medical history and your risk factors. Your health care provider will check the skin on your legs. Other tests may be done to learn more about the ulcer and to determine the best way to treat it. Tests that may be done include:  Measuring the blood pressure in your arms and legs.  Using sound waves (ultrasound) to measure the blood flow in your leg veins. How is this treated? You may need to try several different types of treatment to get your venous ulcer to heal. Healing may take a long time. Treatment may include:  Keeping your leg raised (elevated).   Wearing a type of bandage or stocking to compress the veins of your leg (compression therapy). Venous wounds are not likely to heal or to stay healed without compression.  Taking medicines to improve blood flow.  Taking antibiotic medicines to treat infection.  Cleaning your ulcer and removing any dead tissue from the wound (debridement).  Placing various types of medicated bandage (dressings) or medicated wraps on your ulcer. This helps the ulcer to heal and helps to prevent infection. Surgery is sometimes needed to close the wound using a piece of skin taken from another area of your body (graft). You may need surgery if other treatments are not working or if your ulcer is very deep. Follow these instructions at home: Wound care  Follow instructions from your health care provider about: ? How to take care of your wound. ? When and how you should change your bandage (dressing). ? When you should remove your dressing. If your dressing is dry and sticks to your leg when you try to remove it, moisten or wet the dressing with saline solution or water so that the dressing can be removed without harming your skin or wound tissue.  Check your wound every day for signs of infection. Have a caregiver do  this for you if you are not able to do it yourself. Check for: ? More redness, swelling, or pain. ? More fluid or blood. ? Pus, warmth, or a bad smell. Medicines  Take over-the-counter and prescription medicines only as told by your health care provider.  If you were prescribed an antibiotic medicine, take it or apply it as told by your health care provider. Do not stop taking or using the antibiotic even if your condition improves. Activity  Do not stand or sit in one position for a long period of time. Rest with your legs raised during the day. If possible, keep your legs above your heart for 30 minutes, 3-4 times a day, or as told by your health care provider.  Do not sit with your legs  crossed.  Walk often to increase the blood flow in your legs.Ask your health care provider what level of activity is safe for you.  If you are taking a long ride in a car or plane, take a break to walk around at least once every two hours, or as often as your health care provider recommends. Ask your health care provider if you should take aspirin before long trips. General instructions   Wear elastic stockings, compression stockings, or support hose as told by your health care provider. This is very important.  Raise the foot of your bed as told by your health care provider.  Do not smoke.  Keep all follow-up visits as told by your health care provider. This is important. Contact a health care provider if:  You have a fever.  Your ulcer is getting larger or is not healing.  Your pain gets worse.  You have more redness or swelling around your ulcer.  You have more fluid, blood, or pus coming from your ulcer after it has been cleaned by you or your health care provider.  You have warmth or a bad smell coming from your ulcer. This information is not intended to replace advice given to you by your health care provider. Make sure you discuss any questions you have with your health care provider. Document Released: 10/27/2000 Document Revised: 10/27/2016 Document Reviewed: 06/12/2014 Elsevier Interactive Patient Education  2019 [employer].

## 2018-06-06 NOTE — Progress Notes (Signed)
Ht 5\' 8"  (1.727 m) Comment: Patient reported  Wt (!) 380 lb (172.4 kg) Comment: Patient reported  BMI 57.78 kg/m    Subjective:    Patient ID: [name] [name], male    DOB: Jan 14, 1961, 58 y.o.   MRN: [id number]  HPI: [name] [name] is a 57 y.o. male  Chief Complaint  Patient presents with  . Establish Care  . Hospitalization Follow-up  . Medication Refill    Patient needs the following: colchicine, lisinopril, torsemide, trazodone and furosemide  . Other    Patient is currently taking the following torsemide 20mg  take 2 tablets daily, toresemide 100mg  take 1 tablet daily and furosemide 40mg  take 1 tablet BID   [name] [name] presents today via virtual visit to establish care and for hospital follow up. He has not seen a primary care doctor in about 6 months ago in [place] when he moved here. At that time he was following with wound care, cardiology and a family practice provider. He has been hospitalized 5 times since December 2019, most recently being last week.  Transition of Care Hospital Follow up.   Hospital/Facility: [facility] D/C Physician: Dr. Katheren Shams D/C Date: 06/01/18  Records Requested: 06/06/18 Records Received: 06/06/18 Records Reviewed: 06/06/18  Diagnoses on Discharge: Respiratory Failure, Vomiting  Date of interactive Contact within 48 hours of discharge: 06/01/18 Contact was through: direct  Date of 7 day or 14 day face-to-face visit:  06/06/18  within 7 days  Outpatient Encounter Medications as of 06/06/2018  Medication Sig Note  . albuterol (PROVENTIL HFA;VENTOLIN HFA) 108 (90 Base) MCG/ACT inhaler Inhale 2 puffs into the lungs every 6 (six) hours as needed for wheezing or shortness of breath.   [name] amiodarone (PACERONE) 200 MG tablet Take 1 tablet (200 mg total) by mouth as directed. 2 tablets p.o. twice daily for 4 days, then 1 tablet p.o. daily thereafter   . fluticasone (FLONASE) 50 MCG/ACT nasal spray Place 2 sprays into both nostrils 2 (two) times daily.   [name]  gabapentin (NEURONTIN) 300 MG capsule Take 1 capsule (300 mg total) by mouth 3 (three) times daily.   [name] lisinopril (ZESTRIL) 5 MG tablet Take 0.5 tablets (2.5 mg total) by mouth daily.   [name] loratadine (CLARITIN) 10 MG tablet Take 1 tablet (10 mg total) by mouth daily.   . meloxicam (MOBIC) 15 MG tablet Take 1 tablet (15 mg total) by mouth daily.   . metoprolol succinate (TOPROL-XL) 50 MG 24 hr tablet Take 1 tablet (50 mg total) by mouth 2 (two) times daily. Take with or immediately following a meal.   . nitroGLYCERIN (NITROSTAT) 0.4 MG SL tablet Place 1 tablet (0.4 mg total) under the tongue every 5 (five) minutes as needed for chest pain.   . potassium chloride SA (K-DUR) 20 MEQ tablet Take 1 tablet (20 mEq total) by mouth 2 (two) times daily.   . rivaroxaban (XARELTO) 20 MG TABS tablet Take 1 tablet (20 mg total) by mouth daily. (Patient taking differently: Take 20 mg by mouth every evening. )   . spironolactone (ALDACTONE) 25 MG tablet Take 1 tablet (25 mg total) by mouth daily.   [name] torsemide (DEMADEX) 20 MG tablet Take 2 tablets (40 mg total) by mouth daily.   . traZODone (DESYREL) 150 MG tablet Take 1 tablet (150 mg total) by mouth at bedtime as needed for sleep.   . [DISCONTINUED] lisinopril (PRINIVIL,ZESTRIL) 5 MG tablet Take 2.5 mg by mouth daily. 05/29/2018: Patient is out of this medication.  . [DISCONTINUED]  metoprolol succinate (TOPROL-XL) 50 MG 24 hr tablet Take 1 tablet (50 mg total) by mouth 2 (two) times daily. Take with or immediately following a meal.   . [DISCONTINUED] potassium chloride SA (K-DUR,KLOR-CON) 20 MEQ tablet Take 1 tablet (20 mEq total) by mouth 2 (two) times daily.   . [DISCONTINUED] spironolactone (ALDACTONE) 25 MG tablet Take 1 tablet (25 mg total) by mouth daily.   . [DISCONTINUED] traZODone (DESYREL) 150 MG tablet Take 1 tablet (150 mg total) by mouth at bedtime as needed for sleep.   [name] atorvastatin (LIPITOR) 40 MG tablet Take 1 tablet (40 mg total) by mouth daily.    . budesonide-formoterol (SYMBICORT) 160-4.5 MCG/ACT inhaler Inhale 2 puffs into the lungs 2 (two) times daily.   . colchicine 0.6 MG tablet Take 1 tablet (0.6 mg total) by mouth daily.   . [DISCONTINUED] colchicine 0.6 MG tablet Take 1 tablet (0.6 mg total) by mouth daily. (Patient not taking: Reported on 06/06/2018)    No facility-administered encounter medications on file as of 06/06/2018.   Per Hospitalist: " HOSPITAL COURSE:  *Acute hypoxic respiratory failure Resolved  most likely secondary to acute on chronic A. fib with RVR, extreme morbid obesity, obstructive sleep apnea, and probable acute viral upper respiratory tract infection Successfully weaned off oxygen   *Acute A. fib with RVR Stable Rate controlled Most likely exacerbated by viral upper respiratory tract infection and inability to tolerate rate control agents Continuedbeta-blocker therapy, cardiology did see patient-started on amiodarone/plans for outpatient cardioversion,on Xarelto  *Acute probable viral respiratory tract infection with nausea, emesis, rhinorrhea, nonproductive cough Resolving Influenza/coronavirus/respiratory panel negative Successfully weaned off oxygen  *Acute on chronic allergic rhinitis Flonase and Claritin  *Chronic systolic congestive heart failure without exacerbation with cardiomyopathy Ejection fraction 25-30% on most recent echocardiogram from February 2020 Stable Continue lisinopril, torsemide 40 mg daily,receivedZaroxolynx 1, on Xarelto, spironolactone, cardiology did follow patient while in house   *Chronic obstructive sleep apnea Stable CPAP at bedtime/as needed  *COPD/asthma without exacerbation Stable Breathing treatments PRN  *Chronic extreme morbid obesity Most likely secondary to excess calories Lifestyle modification recommended   *Chronic osteoarthritis/chronic pain syndrome Treated with Neurontin, Flexeril as needed, Mobic, gout diet  *Chronic  gout without flare Gout diet"  Diagnostic Tests Reviewed:  CLINICAL DATA:  Dyspnea.  COPD.  EXAM: PORTABLE CHEST 1 VIEW  COMPARISON:  03/18/2018  FINDINGS: There is chronic borderline cardiomegaly. Pulmonary vascularity is at the upper limits of normal. No infiltrates or effusions. No significant bone abnormality.  IMPRESSION: No acute abnormality.  Chronic mild cardiomegaly.  Disposition: Home  Consults: Cardiology  Discharge Instructions: Follow up here and with cardiology  Disease/illness Education: Given in writing  Home Health/Community Services Discussions/Referrals:  Home health in place. Referral to CCM made for pharmacy and chronic care management- declined social work. Referral to CHF clinic and wound care made today.  Establishment or re-establishment of referral orders for community resources: See above  Discussion with other health care providers:  None  Assessment and Support of treatment regimen adherence: Fair  Appointments Coordinated with:  Patient   Education for self-management, independent living, and ADLs:  Given in writing  Since getting out of the hospital, [name] [name] has felt a lot better physically. His breathing is back to normal. He is fine as long as he is sitting, but gets very short of breath with any type of activity. His chest pain has resolved entirely. He was taking both his furosemide and his torsemide since getting out of the hospital  as he didn't realize he was supposed to stop his furosemide. He denies any chest pain and notes physically, he is doing much better. Home health has come in and seems to be helping. He likes his nurse. She has gotten his legs wrapped to help with the wounds. He states that she is going to get him set up with [employer] boots next time she comes out.    He notes that mentally he is not doing well. His wife passed away suddenly on 06-04-18. She was healthy before that and it happened very suddenly. He is doing OK,  but it has been a big change for him. He is not talking to anyone and is not interested in grief counseling at this time. He will watch it and let us know if he would like [place] to have the social worker call him.   Known heart failure. Was supposed to follow up with heart failure clinic in February following his last hospitalization, but he did not keep that appointment. He states that he has never seen them. He tried during the snow storm, but the office is close. He is due to see cardiology on Thursday virtually.  DIABETES- was on medicine in the past. Was on insulin in the past, now taking garlic and honey in the AM and his BP and sugar had been doing well without any issues Hypoglycemic episodes:no Polydipsia/polyuria: no Visual disturbance: yes Chest pain: no Paresthesias: no Glucose Monitoring: no Taking Insulin?: no Blood Pressure Monitoring: not checking Retinal Examination: Not up to Date Foot Exam: Not up to Date Diabetic Education: Not Completed Pneumovax: unknown Influenza: unknown Aspirin: no  HYPERTENSION Hypertension status: stable  Satisfied with current treatment? yes Duration of hypertension: chronic BP monitoring frequency:  not checking BP range:  BP medication side effects:  no Medication compliance: good compliance Previous BP meds: spironalactone, toremide, lasix, lisinopril Aspirin: no Recurrent headaches: no Visual changes: no Palpitations: no Dyspnea: no Chest pain: no Lower extremity edema: no Dizzy/lightheaded: no  GOUT Duration:chronic, has been acting up for about 8 months,  Usually his whole foot. Index finger and middle finger of R hand Severity: severe  Quality: sharp Swelling: yes Redness: yes Trauma: no Recent dietary change or indiscretion: no Fevers: no Nausea/vomiting: no Status:  worse  COPD COPD status: uncontrolled Satisfied with current treatment?: yes Oxygen use: no Dyspnea frequency: with activity Cough frequency: never  Rescue inhaler frequency: with activity   Limitation of activity: yes Productive cough: none Last Spirometry: unknown Pneumovax: unknown Influenza: unknown   Active Ambulatory Problems    Diagnosis Date Noted  . Acute respiratory failure with hypoxia (HCC) 01/27/2018  . Acute on chronic systolic CHF (congestive heart failure) (HCC) 02/02/2018  . COPD (chronic obstructive pulmonary disease) (HCC) 02/02/2018  . Obstructive sleep apnea 02/02/2018  . Persistent atrial fibrillation   . Diet-controlled diabetes mellitus (HCC) 06/06/2018  . HTN (hypertension) 06/06/2018  . Gout 06/06/2018  . Morbid obesity (HCC) 06/06/2018  . Chronic pain 06/06/2018  . Ulcers of both lower extremities, limited to breakdown of skin (HCC) 06/06/2018   Resolved Ambulatory Problems    Diagnosis Date Noted  . Acute respiratory failure (HCC) 01/16/2018  . Dyspnea   . SOB (shortness of breath)   . Lymphedema 02/02/2018  . Chest pain 02/23/2018  . Sepsis (HCC) 03/17/2018  . Cellulitis 03/17/2018  . Respiratory failure (HCC) 05/29/2018   Past Medical History:  Diagnosis Date  . Asthma   . Brain damage   . HFrEF (  heart failure with reduced ejection fraction) (HCC)   . Sleep apnea    Past Surgical History:  Procedure Laterality Date  . CARDIOVERSION N/A 03/24/2018   Procedure: CARDIOVERSION;  Surgeon: Antonieta Iba, MD;  Location: [facility] ORS;  Service: Cardiovascular;  Laterality: N/A;  . hearnia repair     X 3- total of two surgeries  . LEG SURGERY     Outpatient Encounter Medications as of 06/06/2018  Medication Sig Note  . albuterol (PROVENTIL HFA;VENTOLIN HFA) 108 (90 Base) MCG/ACT inhaler Inhale 2 puffs into the lungs every 6 (six) hours as needed for wheezing or shortness of breath.   [name] amiodarone (PACERONE) 200 MG tablet Take 1 tablet (200 mg total) by mouth as directed. 2 tablets p.o. twice daily for 4 days, then 1 tablet p.o. daily thereafter   . fluticasone (FLONASE) 50 MCG/ACT nasal spray  Place 2 sprays into both nostrils 2 (two) times daily.   [name] gabapentin (NEURONTIN) 300 MG capsule Take 1 capsule (300 mg total) by mouth 3 (three) times daily.   [name] lisinopril (ZESTRIL) 5 MG tablet Take 0.5 tablets (2.5 mg total) by mouth daily.   [name] loratadine (CLARITIN) 10 MG tablet Take 1 tablet (10 mg total) by mouth daily.   . meloxicam (MOBIC) 15 MG tablet Take 1 tablet (15 mg total) by mouth daily.   . metoprolol succinate (TOPROL-XL) 50 MG 24 hr tablet Take 1 tablet (50 mg total) by mouth 2 (two) times daily. Take with or immediately following a meal.   . nitroGLYCERIN (NITROSTAT) 0.4 MG SL tablet Place 1 tablet (0.4 mg total) under the tongue every 5 (five) minutes as needed for chest pain.   . potassium chloride SA (K-DUR) 20 MEQ tablet Take 1 tablet (20 mEq total) by mouth 2 (two) times daily.   . rivaroxaban (XARELTO) 20 MG TABS tablet Take 1 tablet (20 mg total) by mouth daily. (Patient taking differently: Take 20 mg by mouth every evening. )   . spironolactone (ALDACTONE) 25 MG tablet Take 1 tablet (25 mg total) by mouth daily.   [name] torsemide (DEMADEX) 20 MG tablet Take 2 tablets (40 mg total) by mouth daily.   . traZODone (DESYREL) 150 MG tablet Take 1 tablet (150 mg total) by mouth at bedtime as needed for sleep.   . [DISCONTINUED] lisinopril (PRINIVIL,ZESTRIL) 5 MG tablet Take 2.5 mg by mouth daily. 05/29/2018: Patient is out of this medication.  . [DISCONTINUED] metoprolol succinate (TOPROL-XL) 50 MG 24 hr tablet Take 1 tablet (50 mg total) by mouth 2 (two) times daily. Take with or immediately following a meal.   . [DISCONTINUED] potassium chloride SA (K-DUR,KLOR-CON) 20 MEQ tablet Take 1 tablet (20 mEq total) by mouth 2 (two) times daily.   . [DISCONTINUED] spironolactone (ALDACTONE) 25 MG tablet Take 1 tablet (25 mg total) by mouth daily.   . [DISCONTINUED] traZODone (DESYREL) 150 MG tablet Take 1 tablet (150 mg total) by mouth at bedtime as needed for sleep.   [name] atorvastatin  (LIPITOR) 40 MG tablet Take 1 tablet (40 mg total) by mouth daily.   . budesonide-formoterol (SYMBICORT) 160-4.5 MCG/ACT inhaler Inhale 2 puffs into the lungs 2 (two) times daily.   . colchicine 0.6 MG tablet Take 1 tablet (0.6 mg total) by mouth daily.   . [DISCONTINUED] colchicine 0.6 MG tablet Take 1 tablet (0.6 mg total) by mouth daily. (Patient not taking: Reported on 06/06/2018)    No facility-administered encounter medications on file as of 06/06/2018.  No Known Allergies  Social History   Socioeconomic History  . Marital status: Single    Spouse name: Not on file  . Number of children: Not on file  . Years of education: Not on file  . Highest education level: Not on file  Occupational History  . Not on file  Social Needs  . Financial resource strain: Not on file  . Food insecurity:    Worry: Not on file    Inability: Not on file  . Transportation needs:    Medical: Not on file    Non-medical: Not on file  Tobacco Use  . Smoking status: Never Smoker  . Smokeless tobacco: Never Used  Substance and Sexual Activity  . Alcohol use: Never    Frequency: Never  . Drug use: Never  . Sexual activity: Not Currently  Lifestyle  . Physical activity:    Days per week: Not on file    Minutes per session: Not on file  . Stress: Not on file  Relationships  . Social connections:    Talks on phone: Not on file    Gets together: Not on file    Attends religious service: Not on file    Active member of club or organization: Not on file    Attends meetings of clubs or organizations: Not on file    Relationship status: Not on file  Other Topics Concern  . Not on file  Social History Narrative  . Not on file   Family History  Problem Relation Age of Onset  . Heart failure Mother   . Lung cancer Mother   . Lung cancer Father   . Heart attack Maternal Grandmother   . Heart attack Maternal Grandfather     Review of Systems  Constitutional: Negative.   HENT: Negative.    Respiratory: Negative.   Cardiovascular: Negative.   Gastrointestinal: Negative.   Musculoskeletal: Positive for arthralgias, back pain and myalgias. Negative for gait problem, joint swelling, neck pain and neck stiffness.  Skin: Positive for wound. Negative for color change, pallor and rash.  Neurological: Negative.   Psychiatric/Behavioral: Positive for dysphoric mood. Negative for agitation, behavioral problems, confusion, decreased concentration, hallucinations, self-injury, sleep disturbance and suicidal ideas. The patient is not nervous/anxious and is not hyperactive.    Per HPI unless specifically indicated above     Objective:    Ht 5\' 8"  (1.727 m) Comment: Patient reported  Wt (!) 380 lb (172.4 kg) Comment: Patient reported  BMI 57.78 kg/m   Wt Readings from Last 3 Encounters:  06/06/18 (!) 380 lb (172.4 kg)  06/01/18 (!) 381 lb 6.4 oz (173 kg)  03/25/18 (!) 382 lb 12.8 oz (173.6 kg)    Physical Exam Vitals signs and nursing note reviewed.  Pulmonary:     Effort: Pulmonary effort is normal. No respiratory distress.     Comments: Speaking in full sentences Neurological:     Mental Status: He is alert.  Psychiatric:        Mood and Affect: Mood normal.        Behavior: Behavior normal.        Thought Content: Thought content normal.        Judgment: Judgment normal.     Results for orders placed or performed during the hospital encounter of 05/29/18  SARS Coronavirus 2 [facility](Hospital order, [facility])  Result Value Ref Range   SARS Coronavirus 2 NEGATIVE NEGATIVE  Respiratory Panel by PCR  Result Value  Ref Range   Adenovirus NOT DETECTED NOT DETECTED   Coronavirus 229E NOT DETECTED NOT DETECTED   Coronavirus HKU1 NOT DETECTED NOT DETECTED   Coronavirus NL63 NOT DETECTED NOT DETECTED   Coronavirus OC43 NOT DETECTED NOT DETECTED   Metapneumovirus NOT DETECTED NOT DETECTED   Rhinovirus / Enterovirus NOT DETECTED NOT DETECTED   Influenza A  NOT DETECTED NOT DETECTED   Influenza B NOT DETECTED NOT DETECTED   Parainfluenza Virus 1 NOT DETECTED NOT DETECTED   Parainfluenza Virus 2 NOT DETECTED NOT DETECTED   Parainfluenza Virus 3 NOT DETECTED NOT DETECTED   Parainfluenza Virus 4 NOT DETECTED NOT DETECTED   Respiratory Syncytial Virus NOT DETECTED NOT DETECTED   Bordetella pertussis NOT DETECTED NOT DETECTED   Chlamydophila pneumoniae NOT DETECTED NOT DETECTED   Mycoplasma pneumoniae NOT DETECTED NOT DETECTED  CBC with Differential  Result Value Ref Range   WBC 5.0 4.0 - 10.5 K/uL   RBC 4.26 4.22 - 5.81 MIL/uL   Hemoglobin 10.8 (L) 13.0 - 17.0 g/dL   HCT 40.1 (L) 02.7 - 25.3 %   MCV 82.2 80.0 - 100.0 fL   MCH 25.4 (L) 26.0 - 34.0 pg   MCHC 30.9 30.0 - 36.0 g/dL   RDW 66.4 (H) 40.3 - 47.4 %   Platelets 194 150 - 400 K/uL   nRBC 0.0 0.0 - 0.2 %   Neutrophils Relative % 71 %   Neutro Abs 3.6 1.7 - 7.7 K/uL   Lymphocytes Relative 16 %   Lymphs Abs 0.8 0.7 - 4.0 K/uL   Monocytes Relative 8 %   Monocytes Absolute 0.4 0.1 - 1.0 K/uL   Eosinophils Relative 3 %   Eosinophils Absolute 0.1 0.0 - 0.5 K/uL   Basophils Relative 1 %   Basophils Absolute 0.1 0.0 - 0.1 K/uL   Immature Granulocytes 1 %   Abs Immature Granulocytes 0.03 0.00 - 0.07 K/uL  Brain natriuretic peptide  Result Value Ref Range   B Natriuretic Peptide 289.0 (H) 0.0 - 100.0 pg/mL  Comprehensive metabolic panel  Result Value Ref Range   Sodium 137 135 - 145 mmol/L   Potassium 2.9 (L) 3.5 - 5.1 mmol/L   Chloride 103 98 - 111 mmol/L   CO2 23 22 - 32 mmol/L   Glucose, Bld 144 (H) 70 - 99 mg/dL   BUN 6 6 - 20 mg/dL   Creatinine, Ser 2.59 0.61 - 1.24 mg/dL   Calcium 8.1 (L) 8.9 - 10.3 mg/dL   Total Protein 7.3 6.5 - 8.1 g/dL   Albumin 3.2 (L) 3.5 - 5.0 g/dL   AST 23 15 - 41 U/L   ALT 12 0 - 44 U/L   Alkaline Phosphatase 85 38 - 126 U/L   Total Bilirubin 0.8 0.3 - 1.2 mg/dL   GFR calc non Af Amer >60 >60 mL/min   GFR calc Af Amer >60 >60 mL/min   Anion  gap 11 5 - 15  Urinalysis, Complete w Microscopic  Result Value Ref Range   Color, Urine YELLOW (A) YELLOW   APPearance HAZY (A) CLEAR   Specific Gravity, Urine 1.019 1.005 - 1.030   pH 6.0 5.0 - 8.0   Glucose, UA NEGATIVE NEGATIVE mg/dL   Hgb urine dipstick NEGATIVE NEGATIVE   Bilirubin Urine NEGATIVE NEGATIVE   Ketones, ur NEGATIVE NEGATIVE mg/dL   Protein, ur 30 (A) NEGATIVE mg/dL   Nitrite NEGATIVE NEGATIVE   Leukocytes,Ua MODERATE (A) NEGATIVE   RBC / HPF 11-20 0 - 5  RBC/hpf   WBC, UA 21-50 0 - 5 WBC/hpf   Bacteria, UA NONE SEEN NONE SEEN   Squamous Epithelial / LPF 0-5 0 - 5   Mucus PRESENT   Blood gas, venous  Result Value Ref Range   pH, Ven 7.47 (H) 7.250 - 7.430   pCO2, Ven 36 (L) 44.0 - 60.0 mmHg   pO2, Ven 67.0 (H) 32.0 - 45.0 mmHg   Bicarbonate 26.2 20.0 - 28.0 mmol/L   Acid-Base Excess 2.7 (H) 0.0 - 2.0 mmol/L   O2 Saturation 94.2 %   Patient temperature 37.0    Collection site VEIN    Sample type VEIN   Influenza panel by PCR (type A & B)  Result Value Ref Range   Influenza A By PCR NEGATIVE NEGATIVE   Influenza B By PCR NEGATIVE NEGATIVE  Magnesium  Result Value Ref Range   Magnesium 1.7 1.7 - 2.4 mg/dL  TSH  Result Value Ref Range   TSH 2.812 0.350 - 4.500 uIU/mL  Basic metabolic panel  Result Value Ref Range   Sodium 136 135 - 145 mmol/L   Potassium 3.8 3.5 - 5.1 mmol/L   Chloride 95 (L) 98 - 111 mmol/L   CO2 29 22 - 32 mmol/L   Glucose, Bld 108 (H) 70 - 99 mg/dL   BUN 10 6 - 20 mg/dL   Creatinine, Ser 6.71 0.61 - 1.24 mg/dL   Calcium 8.5 (L) 8.9 - 10.3 mg/dL   GFR calc non Af Amer >60 >60 mL/min   GFR calc Af Amer >60 >60 mL/min   Anion gap 12 5 - 15  Basic metabolic panel  Result Value Ref Range   Sodium 133 (L) 135 - 145 mmol/L   Potassium 4.2 3.5 - 5.1 mmol/L   Chloride 94 (L) 98 - 111 mmol/L   CO2 29 22 - 32 mmol/L   Glucose, Bld 105 (H) 70 - 99 mg/dL   BUN 19 6 - 20 mg/dL   Creatinine, Ser 2.45 (H) 0.61 - 1.24 mg/dL   Calcium 8.9  8.9 - 80.9 mg/dL   GFR calc non Af Amer >60 >60 mL/min   GFR calc Af Amer >60 >60 mL/min   Anion gap 10 5 - 15  Basic metabolic panel  Result Value Ref Range   Sodium 129 (L) 135 - 145 mmol/L   Potassium 3.8 3.5 - 5.1 mmol/L   Chloride 90 (L) 98 - 111 mmol/L   CO2 30 22 - 32 mmol/L   Glucose, Bld 129 (H) 70 - 99 mg/dL   BUN 26 (H) 6 - 20 mg/dL   Creatinine, Ser 9.83 (H) 0.61 - 1.24 mg/dL   Calcium 8.4 (L) 8.9 - 10.3 mg/dL   GFR calc non Af Amer 57 (L) >60 mL/min   GFR calc Af Amer >60 >60 mL/min   Anion gap 9 5 - 15      Assessment & Plan:   Problem List Items Addressed This Visit      Cardiovascular and Mediastinum   Acute on chronic systolic CHF (congestive heart failure) (HCC)    To see Dr. Okey Dupre in 2 days. Will get him hooked back in with the heart failure clinic. Referral generated today.      Relevant Medications   atorvastatin (LIPITOR) 40 MG tablet   spironolactone (ALDACTONE) 25 MG tablet   metoprolol succinate (TOPROL-XL) 50 MG 24 hr tablet   lisinopril (ZESTRIL) 5 MG tablet   Other Relevant Orders   CBC with  Differential/Platelet   Comprehensive metabolic panel   TSH   UA/M w/rflx Culture, Routine   AMB referral to CHF clinic   Referral to Chronic Care Management Services   Referral to Chronic Care Management Services   Persistent atrial fibrillation    To be following up with cardiology on 4/23. Due to have cardioversion. On beta-blocker, amiodarone and xarelto.       Relevant Medications   atorvastatin (LIPITOR) 40 MG tablet   spironolactone (ALDACTONE) 25 MG tablet   metoprolol succinate (TOPROL-XL) 50 MG 24 hr tablet   lisinopril (ZESTRIL) 5 MG tablet   Other Relevant Orders   Referral to Chronic Care Management Services   HTN (hypertension)    Will get BP in next couple of days when he comes in for labs. Was taking both furosemide and torsemide. Advised him to stop the furosemide. He will. Monitor closely.      Relevant Medications   atorvastatin  (LIPITOR) 40 MG tablet   spironolactone (ALDACTONE) 25 MG tablet   metoprolol succinate (TOPROL-XL) 50 MG 24 hr tablet   lisinopril (ZESTRIL) 5 MG tablet   Other Relevant Orders   CBC with Differential/Platelet   Comprehensive metabolic panel   TSH   UA/M w/rflx Culture, Routine   Referral to Chronic Care Management Services   Referral to Chronic Care Management Services     Respiratory   COPD (chronic obstructive pulmonary disease) (HCC) (Chronic)    Breathing doing better. SOB on exertion, likely due in part to morbid obesity and CHF. Has had COPD exacerbations previously. Will start symbicort and continue to monitor. Will get him back in with the CHF clinic.       Relevant Medications   budesonide-formoterol (SYMBICORT) 160-4.5 MCG/ACT inhaler   Other Relevant Orders   CBC with Differential/Platelet   Comprehensive metabolic panel   TSH   UA/M w/rflx Culture, Routine   Referral to Chronic Care Management Services   Referral to Chronic Care Management Services   Obstructive sleep apnea (Chronic)    Using his CPAP. Doing well on it. No concerns.       Acute respiratory failure with hypoxia (HCC) - Primary   Relevant Orders   CBC with Differential/Platelet   Comprehensive metabolic panel   TSH   UA/M w/rflx Culture, Routine   Referral to Chronic Care Management Services   Referral to Chronic Care Management Services     Endocrine   Diet-controlled diabetes mellitus (HCC)    Was on insulin previously in [place]. A1c of 6.9 in January- will get repeat A1c and treat as needed. Already on lisinopril 2.5mg  for renal protection. Needs statin. Will start atorvastatin. Continue to monitor.       Relevant Medications   atorvastatin (LIPITOR) 40 MG tablet   lisinopril (ZESTRIL) 5 MG tablet   Other Relevant Orders   Bayer DCA Hb A1c Waived   CBC with Differential/Platelet   Comprehensive metabolic panel   Lipid Panel w/o Chol/HDL Ratio   Microalbumin, Urine Waived   TSH    UA/M w/rflx Culture, Routine   Referral to Chronic Care Management Services   Referral to Chronic Care Management Services     Musculoskeletal and Integument   Ulcers of both lower extremities, limited to breakdown of skin (HCC)    Unable to observe today. Home health nurse is helping and going to get him UNNA boots- will get him into wound care for further evaluation and treatment. Referral generated today.      Relevant Orders  Ambulatory referral to Wound Clinic   Referral to Chronic Care Management Services     Other   Gout    In acute flare. Was supposed to be currently on cochicine, but didn't get it from the pharmacy. New Rx sent over. Will check uric acid and consider allopurinol. Await results. Call with any concerns.       Relevant Orders   CBC with Differential/Platelet   Comprehensive metabolic panel   TSH   UA/M w/rflx Culture, Routine   Uric acid   Referral to Chronic Care Management Services   Referral to Chronic Care Management Services   Morbid obesity [facility])    Will continue to monitor. Will work on trying to lose 1-2lbs per week.      Relevant Orders   CBC with Differential/Platelet   Comprehensive metabolic panel   TSH   UA/M w/rflx Culture, Routine   Referral to Chronic Care Management Services   Referral to Chronic Care Management Services   Chronic pain    On gabapentin and meloxicam. Will obtain previous records. Await results.       Relevant Medications   traZODone (DESYREL) 150 MG tablet   Other Relevant Orders   CBC with Differential/Platelet   Comprehensive metabolic panel   TSH   UA/M w/rflx Culture, Routine   Referral to Chronic Care Management Services   Referral to Chronic Care Management Services    Other Visit Diagnoses    Hesitancy       WIll check labs. Await results.    Relevant Orders   Comprehensive metabolic panel   PSA   TSH   Referral to Chronic Care Management Services   Referral to Chronic Care Management Services    Need for hepatitis C screening test       Labs to be drawn. Await results.    Relevant Orders   Hepatitis C Antibody   Referral to Chronic Care Management Services   Referral to Chronic Care Management Services   Grief       Not interested in grief counseling at this time. Still very new. Continue to monitor. Call with any concerns.        Follow up plan: Return 2 weeks, for follow up in person.    . This visit was completed via telephone due to the restrictions of the COVID-19 pandemic. All issues as above were discussed and addressed but no physical exam was performed. If it was felt that the patient should be evaluated in the office, they were directed there. The patient verbally consented to this visit. Patient was unable to complete an audio/visual visit due to Lack of equipment. Due to the catastrophic nature of the COVID-19 pandemic, this visit was done through audio contact only. . Location of the patient: home . Location of the provider: work . Those involved with this call:  . Provider: Olevia Perches, DO . CMA: Tiffany Reel, CMA . Front Desk/Registration: Adela Ports  . Time spent on call: 45 minutes on the phone discussing health concerns. 65 minutes total spent in review of patient's record and preparation of their chart.

## 2018-06-07 DIAGNOSIS — I872 Venous insufficiency (chronic) (peripheral): Secondary | ICD-10-CM | POA: Diagnosis not present

## 2018-06-07 NOTE — Telephone Encounter (Signed)
Agree with below. Addendum:    I believe that she has limits with moving and including, toileting, bathing, feeding, dressing and grooming. I believe the power wheelchair is needed for pt to be able to perform ADL's in her home

## 2018-06-08 ENCOUNTER — Other Ambulatory Visit: Payer: Self-pay

## 2018-06-08 ENCOUNTER — Telehealth: Payer: Self-pay | Admitting: Family Medicine

## 2018-06-08 ENCOUNTER — Encounter: Payer: Self-pay | Admitting: Internal Medicine

## 2018-06-08 ENCOUNTER — Telehealth (INDEPENDENT_AMBULATORY_CARE_PROVIDER_SITE_OTHER): Payer: Medicaid Other | Admitting: Internal Medicine

## 2018-06-08 VITALS — Ht 68.0 in | Wt 380.0 lb

## 2018-06-08 DIAGNOSIS — I4819 Other persistent atrial fibrillation: Secondary | ICD-10-CM

## 2018-06-08 DIAGNOSIS — I5022 Chronic systolic (congestive) heart failure: Secondary | ICD-10-CM | POA: Diagnosis not present

## 2018-06-08 DIAGNOSIS — R509 Fever, unspecified: Secondary | ICD-10-CM | POA: Diagnosis not present

## 2018-06-08 DIAGNOSIS — Z79899 Other long term (current) drug therapy: Secondary | ICD-10-CM

## 2018-06-08 MED ORDER — AMIODARONE HCL 200 MG PO TABS: 200.0000 mg | ORAL_TABLET | Freq: Every day | ORAL | 1 refills | Status: DC

## 2018-06-08 NOTE — Patient Instructions (Addendum)
Please plan to arrive around 3 pm on 06/16/18 to the [facility].  [address]  [place], [place] [address]. There is valet parking.  Get Lab work first in [employer], then ask for directions to [employer] in the [facility] part of the building.  Instructions:  Your physician has recommended you make the following change in your medication:  1- DECREASE Amiodarone to 200 mg by mouth once a day. 2- CONTINUE taking torsemide and spironolactone by alternating them every other day.  If you need a refill on your cardiac medications before your next appointment, please call your pharmacy.   Lab work: Your physician recommends that you return for lab work the DAY OF YOUR APPOINTMENT PRIOR TO COMING DOWN TO OUR OFFICE. ( BMP, CBC no diff).  - Please go to the [facility]. You will check in at the front desk to the right as you walk into the atrium. Valet Parking is offered if needed.    If you have labs (blood work) drawn today and your tests are completely normal, you will receive your results only by: [name] MyChart Message (if you have MyChart) OR . A paper copy in the mail If you have any lab test that is abnormal or we need to change your treatment, we will call you to review the results.  Testing/Procedures: none  Follow-Up: At [facility], you and your health needs are our priority.  As part of our continuing mission to provide you with exceptional heart care, we have created designated Provider Care Teams.  These Care Teams include your primary Cardiologist (physician) and Advanced Practice Providers (APPs -  Physician Assistants and Nurse Practitioners) who all work together to provide you with the care you need, when you need it. You will need a follow up appointment in 1-2 weeks with DR CHRISTOPHER END.

## 2018-06-08 NOTE — Telephone Encounter (Signed)
Copied from CRM [id number]. Topic: General - Other >> Jun 08, 2018  3:59 PM Lynne Logan D wrote: Reason for CRM: Velna Hatchet with the [facility] stated they contacted pt for an appointment based on referral received from Dr. Laural Benes. Pt stated he has [facility] doing his wound care and declined the appointment at this time. Please advise

## 2018-06-08 NOTE — Telephone Encounter (Signed)
We had discussed having a provider look at the wounds and see if they needed debridement etc that cannot be done by nursing at home. If he wants to hold off for now, that's OK, we'll check out his wounds next visit and determine level of care. Thanks!

## 2018-06-09 ENCOUNTER — Telehealth: Payer: Self-pay

## 2018-06-09 ENCOUNTER — Telehealth: Payer: Medicaid Other | Admitting: Family

## 2018-06-09 ENCOUNTER — Ambulatory Visit: Payer: Self-pay | Admitting: Pharmacist

## 2018-06-09 ENCOUNTER — Telehealth: Payer: Self-pay | Admitting: Family Medicine

## 2018-06-09 DIAGNOSIS — I5023 Acute on chronic systolic (congestive) heart failure: Secondary | ICD-10-CM

## 2018-06-09 DIAGNOSIS — G894 Chronic pain syndrome: Secondary | ICD-10-CM

## 2018-06-09 DIAGNOSIS — I872 Venous insufficiency (chronic) (peripheral): Secondary | ICD-10-CM | POA: Diagnosis not present

## 2018-06-09 NOTE — Telephone Encounter (Signed)
Copied from CRM [id number]. Topic: Referral - Request for Referral >> Jun 08, 2018  9:13 AM Gwenlyn Fudge A wrote: Has patient seen PCP for this complaint? Yes.   *If NO, is insurance requiring patient see PCP for this issue before PCP can refer them? Referral for which specialty: Pain  Preferred provider/office: [facility] [phone] Reason for referral: chronic pain.  Pt is requesting to be contacted when the referral is sent over to the clinic.

## 2018-06-09 NOTE — Chronic Care Management (AMB) (Signed)
  Chronic Care Management   Note  06/09/2018 Name: [name] [name] MRN: [id number] DOB: 1961-01-06  Patient is a 58 year old male followed by Olevia Perches, DO for primary care services, referred to chronic care management for support in managing CHF, HTN, atrial fibrillation, COPD.   Unsuccessful telephonic outreach to patient; left HIPAA compliant message for patient to return my call at his convenience.    Follow up plan: - If I have not heard back, will outreach within 1-2 weeks  Catie Feliz Beam, PharmD Clinical Pharmacist [facility] [phone]

## 2018-06-13 ENCOUNTER — Ambulatory Visit: Payer: Self-pay | Admitting: Pharmacist

## 2018-06-13 DIAGNOSIS — I872 Venous insufficiency (chronic) (peripheral): Secondary | ICD-10-CM | POA: Diagnosis not present

## 2018-06-13 DIAGNOSIS — I4819 Other persistent atrial fibrillation: Secondary | ICD-10-CM

## 2018-06-13 DIAGNOSIS — I10 Essential (primary) hypertension: Secondary | ICD-10-CM

## 2018-06-13 DIAGNOSIS — I5023 Acute on chronic systolic (congestive) heart failure: Secondary | ICD-10-CM

## 2018-06-13 NOTE — Progress Notes (Deleted)
Follow-up Outpatient Visit Date: 06/16/2018  Primary Care Provider: Dorcas Carrow, DO [address] [place] [address]  Chief Complaint: ***  HPI:  [name] [name] is a 58 y.o. year-old male with history of chronic systolic heart failure, persistent atrial fibrillation with recurrence following cardioversion, obstructive sleep apnea on CPAP, and morbid obesity, who presents for follow-up of heart failure and atrial fibrillation.  He was hospitalized in mid April with shortness of breath and fever.  He was diuresed and rate controlled, given that he had return to atrial fibrillation after his cardioversion in February.  We spoke by video last week, at which time [name] [name] continued to note fatigue and shortness of breath as well as subjective fevers.  Overall, however, he was feeling better with improvement in his leg edema.  We agreed to decrease amiodarone to 200 mg daily, as he had completed a 10 gm load.  Plans were also made for labs and office follow-up today to discuss his heart failure and atrial fibrillation.  --------------------------------------------------------------------------------------------------  Past Medical History:  Diagnosis Date  . Asthma   . Brain damage   . COPD (chronic obstructive pulmonary disease) (HCC)   . HFrEF (heart failure with reduced ejection fraction) (HCC)    a. 03/2018 Echo: EF 25-30%, diff HK. Mod dil LA.  [name] Persistent atrial fibrillation    a. 03/2018 s/p DCCV; b. CHA2DS2VASc = 1-->Xarelto; c. 05/2018 recurrent afib-->Amio initiated;   . Sleep apnea    Past Surgical History:  Procedure Laterality Date  . CARDIOVERSION N/A 03/24/2018   Procedure: CARDIOVERSION;  Surgeon: Antonieta Iba, MD;  Location: [facility] ORS;  Service: Cardiovascular;  Laterality: N/A;  . hearnia repair     X 3- total of two surgeries  . LEG SURGERY      No outpatient medications have been marked as taking for the 06/16/18 encounter (Appointment) with Bryanna Yim, [name] Deer, MD.    Allergies: Patient has no known allergies.  Social History   Tobacco Use  . Smoking status: Never Smoker  . Smokeless tobacco: Never Used  Substance Use Topics  . Alcohol use: Never    Frequency: Never  . Drug use: Never    Family History  Problem Relation Age of Onset  . Heart failure Mother   . Lung cancer Mother   . Lung cancer Father   . Heart attack Maternal Grandmother   . Heart attack Maternal Grandfather     Review of Systems: A 12-system review of systems was performed and was negative except as noted in the HPI.  --------------------------------------------------------------------------------------------------  Physical Exam: There were no vitals taken for this visit.  General:  *** HEENT: No conjunctival pallor or scleral icterus. Moist mucous membranes.  OP clear. Neck: Supple without lymphadenopathy, thyromegaly, JVD, or HJR. No carotid bruit. Lungs: Normal work of breathing. Clear to auscultation bilaterally without wheezes or crackles. Heart: Regular rate and rhythm without murmurs, rubs, or gallops. Non-displaced PMI. Abd: Bowel sounds present. Soft, NT/ND without hepatosplenomegaly Ext: No lower extremity edema. Radial, PT, and DP pulses are 2+ bilaterally. Skin: Warm and dry without rash.  EKG:  ***  Lab Results  Component Value Date   WBC 5.0 05/29/2018   HGB 10.8 (L) 05/29/2018   HCT 35.0 (L) 05/29/2018   MCV 82.2 05/29/2018   PLT 194 05/29/2018    Lab Results  Component Value Date   NA 129 (L) 06/01/2018   K 3.8 06/01/2018   CL 90 (L) 06/01/2018   CO2 30  06/01/2018   BUN 26 (H) 06/01/2018   CREATININE 1.37 (H) 06/01/2018   GLUCOSE 129 (H) 06/01/2018   ALT 12 05/29/2018    Lab Results  Component Value Date   CHOL 185 01/28/2018   HDL 81 01/28/2018   LDLCALC 94 01/28/2018   TRIG 48 01/28/2018   CHOLHDL 2.3 01/28/2018    --------------------------------------------------------------------------------------------------   ASSESSMENT AND PLAN: [name] Deer Quadir Muns, MD 06/13/2018 3:29 PM

## 2018-06-13 NOTE — Patient Instructions (Signed)
Visit Information  Goals Addressed            This Visit's Progress     Patient Stated   . "I'm on a lot of medications" (pt-stated)       Current Barriers:  [name] Knowledge Deficits related to optimal medication management; reports adherence, but notes that he doesn't use a pill box or any adherence aids. Also notes that his wife helped manage most of his medications prior to her sudden death o Reports that he plans on buying aspirin 81 mg to "take a few" when he has chest pain o Notes difficulty splitting his lisinopril 2.5 mg tablet . Literacy Barriers - is able to spell out medication names to me and instructions over the phone, but reads slowly  . Financial Barriers - denies problems affording medications, but endorses difficulties with CPAP supplies and food   Pharmacist Clinical Goal(s):  [name] Over the next 90 days, patient will work with CCM team to address needs related to optimized medication management  Interventions: . Comprehensive medication review performed. Medications dosed appropriately for renal function . Re-educated on nitroglycerin dosing for acute chest pain; advised against using aspirin PRN chest pain in combination with Xarelto o Ensured patient is taking Xarelto with food, as required for adequate absorption . Will mail patient a QID weekly pill box and pill splitter for assistance in managing medications. Counseled on how to use the pill box appropriately. Provided list of which medications would go in which time slot o AM: amiodarone, colchicine, gabapentin, potassium, metoprolol, meloxicam, Xarelto (notes AM is his most consistent meal), spironolactone, torsemide o Noon: gabapentin o Evening: metoprolol, potassium, atorvastatin, lisinopril, loratidine o Bedtime: trazodone  Patient Self Care Activities:  . Calls pharmacy for medication refills . Calls provider office for new concerns or questions  Initial goal documentation        Mr. Bua was given  information about Chronic Care Management services today including:  1. CCM service includes personalized support from designated clinical staff supervised by his physician, including individualized plan of care and coordination with other care providers 2. 24/7 contact phone numbers for assistance for urgent and routine care needs. 3. Service will only be billed when office clinical staff spend 20 minutes or more in a month to coordinate care. 4. Only one practitioner may furnish and bill the service in a calendar month. 5. The patient may stop CCM services at any time (effective at the end of the month) by phone call to the office staff. 6. The patient will be responsible for cost sharing (co-pay) of up to 20% of the service fee (after annual deductible is met).  Patient agreed to services and verbal consent obtained.   The patient verbalized understanding of instructions provided today and declined a print copy of patient instruction materials.   Plan: - Will collaborate with CCM RN and LCSW colleagues to provide comprehensive support - Will outreach patient in ~2 weeks to discuss filling pill boxes  [name], PharmD Clinical Pharmacist [place] [phone]

## 2018-06-13 NOTE — Chronic Care Management (AMB) (Signed)
**Note [name]-Identified via Obfuscation** Chronic Care Management   Note  06/13/2018 Name: [name] [name] MRN: [id number] DOB: 10-02-1960   Subjective:  Patient is a 58 year old male followed by [name] Liter, [name] [name] for primary care, referred to chronic care management for support in managing CHF, atrial fibrillation, OSA, gout.   Contacted patient telephonically for medication review and discussion of needs.   [name] [name] was given information about Chronic Care Management services today including:  1. CCM service includes personalized support from designated clinical staff supervised by his physician, including individualized plan of care and coordination with other care providers 2. 24/7 contact phone numbers for assistance for urgent and routine care needs. 3. Service will only be billed when office clinical staff spend 20 minutes or more in a month to coordinate care. 4. Only one practitioner may furnish and bill the service in a calendar month. 5. The patient may stop CCM services at any time (effective at the end of the month) by phone call to the office staff. 6. The patient will be responsible for cost sharing (co-pay) of up to 20% of the service fee (after annual deductible is met).  Patient agreed to services and verbal consent obtained.   Review of patient status, including review of consultants reports, laboratory and other test data, was performed as part of comprehensive evaluation and provision of chronic care management services.   Objective: Lab Results  Component Value Date   CREATININE 1.37 (H) 06/01/2018   CREATININE 1.26 (H) 05/31/2018   CREATININE 0.98 05/30/2018  eGFR 59 mL/min  Lab Results  Component Value Date   HGBA1C 6.9 (H) 03/17/2018    Lipid Panel     Component Value Date/Time   CHOL 185 01/28/2018 0512   TRIG 48 01/28/2018 0512   HDL 81 01/28/2018 0512   CHOLHDL 2.3 01/28/2018 0512   VLDL 10 01/28/2018 0512   LDLCALC 94 01/28/2018 0512    BP Readings from Last 3 Encounters:   06/01/18 (!) 115/52  03/25/18 107/64  02/27/18 114/79    No Known Allergies  Medications Reviewed Today    Reviewed by [name] [name], [name] [name] (Pharmacist) on 06/13/18 at 1009  Med List Status: <None>  Medication Order Taking? Sig Documenting Provider Last Dose Status Informant  albuterol (PROVENTIL HFA;VENTOLIN HFA) 108 (90 Base) MCG/ACT inhaler [id number] Yes Inhale 2 puffs into the lungs every 6 (six) hours as needed for wheezing or shortness of breath. Salary, [name] [name] Taking Active            Med Note ([place] Jun 13, 2018  9:50 AM) Uses for SOB on activity (walking to car); in heat/humidity  amiodarone (PACERONE) 200 MG tablet [id number] Yes Take 1 tablet (200 mg total) by mouth daily. End, Harrell Gave, [name] Taking Active   atorvastatin (LIPITOR) 40 MG tablet [id number] Yes Take 1 tablet (40 mg total) by mouth daily. [name] [name] [name], [name] [name] Taking Active   colchicine 0.6 MG tablet [id number] Yes Take 1 tablet (0.6 mg total) by mouth daily.  Patient taking differently:  Take 0.6 mg by mouth every other day.    Johnson, Megan [name], [name] [name] Taking Active   fluticasone (FLONASE) 50 MCG/ACT nasal spray [id number] Yes Place 2 sprays into both nostrils 2 (two) times daily. Salary, [name] [name] Taking Active   gabapentin (NEURONTIN) 300 MG capsule [id number] Yes Take 1 capsule (300 mg total) by mouth 3 (three) times daily. Salary, [name] [name] Taking Active   ketotifen (ZADITOR) 0.025 %  ophthalmic solution [id number] Yes 1 drop 2 (two) times daily. Provider, Historical, [name] Taking Active   lisinopril (ZESTRIL) 5 MG tablet [id number] Yes Take 0.5 tablets (2.5 mg total) by mouth daily. Johnson, Megan [name], [name] [name] Taking Active   loratadine (CLARITIN) 10 MG tablet [id number] Yes Take 10 mg by mouth daily. Provider, Historical, [name] Taking Active   meloxicam (MOBIC) 15 MG tablet [id number] Yes Take 1 tablet (15 mg total) by mouth daily. Salary, [name] [name] Taking Active   metoprolol succinate  (TOPROL-XL) 50 MG 24 hr tablet [id number] Yes Take 1 tablet (50 mg total) by mouth 2 (two) times daily. Take with or immediately following a meal. [name] [name] Taking Active   nitroGLYCERIN (NITROSTAT) 0.4 MG SL tablet [id number] No Place 1 tablet (0.4 mg total) under the tongue every 5 (five) minutes as needed for chest pain.  Patient not taking:  Reported on 06/13/2018   [name] Flock, [name] Not Taking Active Self  potassium chloride SA (K-DUR) 20 MEQ tablet [id number] Yes Take 1 tablet (20 mEq total) by mouth 2 (two) times daily. Johnson, Megan [name], [name] [name] Taking Active   rivaroxaban (XARELTO) 20 MG TABS tablet [id number] Yes Take 1 tablet (20 mg total) by mouth daily.  Patient taking differently:  Take 20 mg by mouth every evening.    [name] Flock, [name] Taking Active Self  spironolactone (ALDACTONE) 25 MG tablet [id number] Yes Take 1 tablet (25 mg total) by mouth daily. [name] [name] [name], [name] [name] Taking Active   Thiamine HCl (B-1) 250 MG TABS [id number] Yes Take 1 tablet by mouth daily. Provider, Historical, [name] Taking Active   torsemide (DEMADEX) 20 MG tablet [id number] Yes Take 2 tablets (40 mg total) by mouth daily. Salary, [name] [name] Taking Active   traZODone (DESYREL) 150 MG tablet [id number] Yes Take 1 tablet (150 mg total) by mouth at bedtime as needed for sleep. [name] Roys, [name] [name] Taking Active           Assessment:   Goals Addressed            This Visit's Progress     Patient Stated   . "I'm on a lot of medications" (pt-stated)       Current Barriers:  [name] Knowledge Deficits related to optimal medication management; reports adherence, but notes that he doesn't use a pill box or any adherence aids. Also notes that his wife helped manage most of his medications prior to her sudden death o Reports that he plans on buying aspirin 81 mg to "take a few" when he has chest pain o Notes difficulty splitting his lisinopril 2.5 mg tablet . Literacy Barriers - is able to spell out medication  names to me and instructions over the phone, but reads slowly  . Financial Barriers - denies problems affording medications, but endorses difficulties with CPAP supplies and food   Pharmacist Clinical Goal(s):  [name] Over the next 90 days, patient will work with CCM team to address needs related to optimized medication management  Interventions: . Comprehensive medication review performed. Medications dosed appropriately for renal function . Re-educated on nitroglycerin dosing for acute chest pain; advised against using aspirin PRN chest pain in combination with Xarelto o Ensured patient is taking Xarelto with food, as required for adequate absorption . Will mail patient a QID weekly pill box and pill splitter for assistance in managing medications. Counseled on how to use the pill box appropriately. Provided list of which medications would go in which time slot  Patient Self Care Activities:  . Calls pharmacy for medication refills . Calls provider office for new concerns or questions  Initial goal documentation        Plan: - Will collaborate with CCM RN and LCSW colleagues to provide comprehensive support - Will outreach patient in ~2 weeks to discuss filling pill boxes  [name], PharmD Clinical Pharmacist [place] [phone]

## 2018-06-14 ENCOUNTER — Other Ambulatory Visit: Payer: Self-pay

## 2018-06-14 ENCOUNTER — Other Ambulatory Visit: Payer: Medicaid Other

## 2018-06-14 ENCOUNTER — Ambulatory Visit: Payer: Medicaid Other | Admitting: Internal Medicine

## 2018-06-14 DIAGNOSIS — E119 Type 2 diabetes mellitus without complications: Secondary | ICD-10-CM

## 2018-06-14 DIAGNOSIS — Z1159 Encounter for screening for other viral diseases: Secondary | ICD-10-CM

## 2018-06-14 DIAGNOSIS — R3911 Hesitancy of micturition: Secondary | ICD-10-CM

## 2018-06-14 DIAGNOSIS — J9601 Acute respiratory failure with hypoxia: Secondary | ICD-10-CM | POA: Diagnosis not present

## 2018-06-14 DIAGNOSIS — M1A9XX0 Chronic gout, unspecified, without tophus (tophi): Secondary | ICD-10-CM

## 2018-06-14 DIAGNOSIS — I10 Essential (primary) hypertension: Secondary | ICD-10-CM

## 2018-06-14 DIAGNOSIS — J410 Simple chronic bronchitis: Secondary | ICD-10-CM

## 2018-06-14 DIAGNOSIS — G894 Chronic pain syndrome: Secondary | ICD-10-CM | POA: Diagnosis not present

## 2018-06-14 DIAGNOSIS — I5023 Acute on chronic systolic (congestive) heart failure: Secondary | ICD-10-CM

## 2018-06-14 LAB — MICROALBUMIN, URINE WAIVED
Creatinine, Urine Waived: 10 mg/dL (ref 10–300)
Microalb, Ur Waived: 10 mg/L (ref 0–19)
Microalb/Creat Ratio: <30 mg/g (ref <30)

## 2018-06-15 LAB — COMPREHENSIVE METABOLIC PANEL
ALT: 19 IU/L (ref 0–44)
AST: 20 IU/L (ref 0–40)
Albumin/Globulin Ratio: 1.3 (ref 1.2–2.2)
Albumin: 3.7 g/dL — ABNORMAL LOW (ref 3.8–4.9)
Alkaline Phosphatase: 66 IU/L (ref 39–117)
BUN/Creatinine Ratio: 18 (ref 9–20)
BUN: 22 mg/dL (ref 6–24)
Bilirubin Total: <0.2 mg/dL (ref 0.0–1.2)
CO2: 27 mmol/L (ref 20–29)
Calcium: 9.3 mg/dL (ref 8.7–10.2)
Chloride: 98 mmol/L (ref 96–106)
Creatinine, Ser: 1.24 mg/dL (ref 0.76–1.27)
GFR calc Af Amer: 74 mL/min/{1.73_m2} (ref >59)
GFR calc non Af Amer: 64 mL/min/{1.73_m2} (ref >59)
Globulin, Total: 2.9 g/dL (ref 1.5–4.5)
Glucose: 95 mg/dL (ref 65–99)
Potassium: 5.5 mmol/L — ABNORMAL HIGH (ref 3.5–5.2)
Sodium: 137 mmol/L (ref 134–144)
Total Protein: 6.6 g/dL (ref 6.0–8.5)

## 2018-06-15 LAB — CBC WITH DIFFERENTIAL/PLATELET
Basophils Absolute: 0.1 10*3/uL (ref 0.0–0.2)
Basos: 2 %
EOS (ABSOLUTE): 0.3 10*3/uL (ref 0.0–0.4)
Eos: 5 %
Hematocrit: 33.1 % — ABNORMAL LOW (ref 37.5–51.0)
Hemoglobin: 10.2 g/dL — ABNORMAL LOW (ref 13.0–17.7)
Immature Grans (Abs): 0 10*3/uL (ref 0.0–0.1)
Immature Granulocytes: 1 %
Lymphocytes Absolute: 1.3 10*3/uL (ref 0.7–3.1)
Lymphs: 21 %
MCH: 25 pg — ABNORMAL LOW (ref 26.6–33.0)
MCHC: 30.8 g/dL — ABNORMAL LOW (ref 31.5–35.7)
MCV: 81 fL (ref 79–97)
Monocytes Absolute: 0.8 10*3/uL (ref 0.1–0.9)
Monocytes: 13 %
Neutrophils Absolute: 3.6 10*3/uL (ref 1.4–7.0)
Neutrophils: 58 %
Platelets: 504 10*3/uL — ABNORMAL HIGH (ref 150–450)
RBC: 4.08 x10E6/uL — ABNORMAL LOW (ref 4.14–5.80)
RDW: 17.4 % — ABNORMAL HIGH (ref 11.6–15.4)
WBC: 6.1 10*3/uL (ref 3.4–10.8)

## 2018-06-15 LAB — LIPID PANEL W/O CHOL/HDL RATIO
Cholesterol, Total: 122 mg/dL (ref 100–199)
HDL: 62 mg/dL (ref >39)
LDL Calculated: 43 mg/dL (ref 0–99)
Triglycerides: 85 mg/dL (ref 0–149)
VLDL Cholesterol Cal: 17 mg/dL (ref 5–40)

## 2018-06-15 LAB — HEPATITIS C ANTIBODY: Hep C Virus Ab: <0.1 s/co ratio (ref 0.0–0.9)

## 2018-06-15 LAB — TSH: TSH: 7.07 u[IU]/mL — ABNORMAL HIGH (ref 0.450–4.500)

## 2018-06-15 LAB — PSA: Prostate Specific Ag, Serum: 0.4 ng/mL (ref 0.0–4.0)

## 2018-06-15 LAB — URIC ACID: Uric Acid: 10.1 mg/dL — ABNORMAL HIGH (ref 3.7–8.6)

## 2018-06-16 ENCOUNTER — Ambulatory Visit: Payer: Medicaid Other | Admitting: Internal Medicine

## 2018-06-16 DIAGNOSIS — I872 Venous insufficiency (chronic) (peripheral): Secondary | ICD-10-CM | POA: Diagnosis not present

## 2018-06-16 LAB — UA/M W/RFLX CULTURE, ROUTINE
Bilirubin, UA: NEGATIVE
Glucose, UA: NEGATIVE
Ketones, UA: NEGATIVE
Nitrite, UA: NEGATIVE
Protein,UA: NEGATIVE
Specific Gravity, UA: <1.005 — ABNORMAL LOW (ref 1.005–1.030)
Urobilinogen, Ur: 0.2 mg/dL (ref 0.2–1.0)
pH, UA: 5 (ref 5.0–7.5)

## 2018-06-16 LAB — URINE CULTURE, REFLEX

## 2018-06-16 LAB — BAYER DCA HB A1C WAIVED: HB A1C (BAYER DCA - WAIVED): 6 % (ref <7.0)

## 2018-06-16 LAB — MICROSCOPIC EXAMINATION: RBC, Urine: NONE SEEN /hpf (ref 0–2)

## 2018-06-20 ENCOUNTER — Telehealth: Payer: Self-pay | Admitting: Family Medicine

## 2018-06-20 ENCOUNTER — Other Ambulatory Visit: Payer: Self-pay

## 2018-06-20 ENCOUNTER — Ambulatory Visit: Payer: Self-pay | Admitting: Licensed Clinical Social Worker

## 2018-06-20 DIAGNOSIS — F4321 Adjustment disorder with depressed mood: Secondary | ICD-10-CM

## 2018-06-20 DIAGNOSIS — I872 Venous insufficiency (chronic) (peripheral): Secondary | ICD-10-CM | POA: Diagnosis not present

## 2018-06-20 NOTE — Telephone Encounter (Signed)
Byrd Hesselbach notified of Verbal Orders

## 2018-06-20 NOTE — Telephone Encounter (Signed)
OK for verbal orders?

## 2018-06-20 NOTE — Chronic Care Management (AMB) (Signed)
  Chronic Care Management    Clinical Social Work General Note  06/20/2018 Name: [name] [name] MRN: [id number] DOB: 03-21-1960  [name] [name] is a 58 y.o. year old male who is a primary care patient of Dorcas Carrow, DO. The CCM was consulted to assist the patient with [name] and Grief Counseling.   Review of patient status, including review of consultants reports, relevant laboratory and other test results, and collaboration with appropriate care team members and the patient's provider was performed as part of comprehensive patient evaluation and provision of chronic care management services.    Goals Addressed    . "I need help adjusting after the loss of my spouse" (pt-stated)       Current Barriers:  . Financial constraints . Limited social support . ADL IADL limitations . Mental Health Concerns  . Social Isolation . Limited education about available grief support resources within the area* . Limited access to caregiver  Clinical Social Work Clinical Goal(s):  [name] Over the next 90 days, client will work with SW to address concerns related to grief (loss of spouse last month) . Over the next 90 days, patient will work with PCP  to address needs related to lack of care/support within the home and need for personal care services  Interventions: . Patient interviewed and appropriate assessments performed . Provided mental health counseling with regard to grief support . Discussed plans with patient for ongoing care management follow up and provided patient with direct contact information for care management team . Collaborated with primary care provider re: Patient is requesting PCS services now that spouse has passed away as she was his primary caregiver. Patient has Medicaid. PCS application request made* . Collaborated with [employer] (community agency) re: individual counseling referral for grief support . Assisted patient/caregiver with obtaining information about  health plan benefits  Patient Self Care Activities:  . Attends all scheduled provider appointments . Calls provider office for new concerns or questions  Initial goal documentation   Follow Up Plan: SW will follow up with patient by phone over the next 14 days      [name], BSW, MSW, LCSW [facility] [place]  [facility] [email or web address] Phone: [phone]

## 2018-06-20 NOTE — Telephone Encounter (Signed)
Copied from CRM [id number]. Topic: Quick Communication - Home Health Verbal Orders >> Jun 20, 2018 12:03 PM Lorayne Bender wrote: Caller/Agency: Byrd Hesselbach from [facility] Number: [phone], OK to leave a message Requesting OT/PT/Skilled Nursing/Social Work/Speech Therapy: PT Frequency: 1x a week for 1 week, and 2x a week for 4 weeks.

## 2018-06-21 DIAGNOSIS — I872 Venous insufficiency (chronic) (peripheral): Secondary | ICD-10-CM | POA: Diagnosis not present

## 2018-06-23 DIAGNOSIS — I872 Venous insufficiency (chronic) (peripheral): Secondary | ICD-10-CM | POA: Diagnosis not present

## 2018-06-26 ENCOUNTER — Other Ambulatory Visit: Payer: Self-pay

## 2018-06-26 DIAGNOSIS — I872 Venous insufficiency (chronic) (peripheral): Secondary | ICD-10-CM | POA: Diagnosis not present

## 2018-06-26 MED ORDER — RIVAROXABAN 20 MG PO TABS: 20.0000 mg | ORAL_TABLET | Freq: Every evening | ORAL | 6 refills | Status: DC

## 2018-06-26 NOTE — Telephone Encounter (Signed)
Pt's age 58, wt 172.4 kg, SCr 1.24 (06/14/18), CrCl 160.27, last appt w/ Dr. Okey Dupre 06/08/18. Refill sent in as requested, but w/ 6 refills.

## 2018-06-26 NOTE — Telephone Encounter (Signed)
Pharmacy requesting refill for Xarelto 20MG  60 tablets 0 refills

## 2018-06-27 ENCOUNTER — Telehealth: Payer: Self-pay | Admitting: Family Medicine

## 2018-06-27 ENCOUNTER — Other Ambulatory Visit: Payer: Self-pay

## 2018-06-27 ENCOUNTER — Telehealth: Payer: Self-pay

## 2018-06-27 DIAGNOSIS — I872 Venous insufficiency (chronic) (peripheral): Secondary | ICD-10-CM | POA: Diagnosis not present

## 2018-06-27 DIAGNOSIS — R7989 Other specified abnormal findings of blood chemistry: Secondary | ICD-10-CM | POA: Insufficient documentation

## 2018-06-27 DIAGNOSIS — D649 Anemia, unspecified: Secondary | ICD-10-CM

## 2018-06-27 MED ORDER — ALLOPURINOL 100 MG PO TABS: 100.0000 mg | ORAL_TABLET | Freq: Every day | ORAL | 3 refills | Status: DC

## 2018-06-27 NOTE — Telephone Encounter (Signed)
That upcoming appointment is for CCM. Should he be scheduled for in office or virtual visit on the 19th (or whenever closest date you are here)

## 2018-06-27 NOTE — Telephone Encounter (Signed)
Error

## 2018-06-27 NOTE — Telephone Encounter (Signed)
Was supposed to have follow up 2 weeks following last visit. Please get him scheduled for in-office visit ASAP.

## 2018-06-27 NOTE — Telephone Encounter (Addendum)
Pt scheduled for in office visit w/ bloodwork. Pt aware.

## 2018-06-27 NOTE — Telephone Encounter (Signed)
Please let him know that all his labs look really good except his gout labs were really high, his blood count was a little low, and his thyroid was a little under active. I'm going to send him in a differnet medicine for the gout- keep taking the colchicine and we'll see about switching him over. We'll recheck his thyroid and blood count when I see him next week. Everything else looks great. Thanks!

## 2018-06-28 ENCOUNTER — Ambulatory Visit: Payer: Self-pay | Admitting: *Deleted

## 2018-06-28 ENCOUNTER — Telehealth: Payer: Self-pay

## 2018-06-28 NOTE — Chronic Care Management (AMB) (Signed)
  Chronic Care Management   Note  06/28/2018 Name: Arnesh Bandt MRN: [id number] DOB: [birth year]  Opened encounter in error.   Ma Rings Miquel Stacks RN, BSN Nurse Case Education officer, community Family Practice/[facility]  [phone]) [employer]

## 2018-06-28 NOTE — Chronic Care Management (AMB) (Signed)
  Chronic Care Management   Follow Up Note   06/28/2018 Name: [name] [name] MRN: [id number] DOB: [birth year]  Referred by: Dorcas Carrow, DO Reason for referral : No chief complaint on file.   [name] [name] is a 58 y.o. year old male who is a primary care patient of Dorcas Carrow, DO. The CCM team was consulted for assistance with chronic disease management and care coordination needs.    Review of patient status, including review of consultants reports, relevant laboratory and other test results, and collaboration with appropriate care team members and the patient's provider was performed as part of comprehensive patient evaluation and provision of chronic care management services.    Spoke with patient on the phone. Patient stated he recently started seeing cardiologist. He says he knows he needs to lose weight but just doesn't know how to get started. Plan to set a long term goal of weight loss.   Goals Addressed            This Visit's Progress   . I would like to lose weight (pt-stated)       Current Barriers:  [name] Knowledge Deficits related to how to effectively achieve weight loss.  Darylene Price caregiver support.   Nurse Case Manager Clinical Goal(s):  [name] Over the next 90 days, patient will work with [facility] to address needs related to weight loss  Interventions:  . Provided education to patient re: Weight Management   . Discussed with patient the benefits of weight loss   Patient Self Care Activities:  . Currently UNABLE TO independently lose weight  Initial goal documentation         The CM team will reach out to the patient again over the next 30 days.    Ma Rings Arm[place] Silveria RN, BSN Nurse Case Education officer, community Family Practice/[facility] Management  [phone]) [employer]

## 2018-06-28 NOTE — Patient Instructions (Signed)
Thank you allowing the Chronic Care Management Team to be a part of your care! It was a pleasure speaking with you today!   CCM (Chronic Care Management) Team   Napoleon Monacelli RN, BSN Nurse Care Coordinator  [phone]  Catie [facility] PharmD  Clinical Pharmacist  [phone]  [name] LCSW Clinical Social Worker [phone]  Goals Addressed            This Visit's Progress   . I would like to lose weight (pt-stated)       Current Barriers:  [name] Knowledge Deficits related to how to effectively achieve weight loss.  Darylene Price caregiver support.   Nurse Case Manager Clinical Goal(s):  [name] Over the next 90 days, patient will work with [facility] to address needs related to weight loss  Interventions:  . Provided education to patient re: Weight Management   . Discussed with patient the benefits of weight loss   Patient Self Care Activities:  . Currently UNABLE TO independently lose weight  Initial goal documentation        The patient verbalized understanding of instructions provided today and declined a print copy of patient instruction materials.   The CM team will reach out to the patient again over the next 30 days.  The patient has been provided with contact information for the chronic care management team and has been advised to call with any health related questions or concerns.

## 2018-06-28 NOTE — Patient Instructions (Signed)
Gout    Gout is painful swelling of your joints. Gout is a type of arthritis. It is caused by having too much uric acid in your body. Uric acid is a chemical that is made when your body breaks down substances called purines. If your body has too much uric acid, sharp crystals can form and build up in your joints. This causes pain and swelling.  Gout attacks can happen quickly and be very painful (acute gout). Over time, the attacks can affect more joints and happen more often (chronic gout).  What are the causes?  · Too much uric acid in your blood. This can happen because:  ? Your kidneys do not remove enough uric acid from your blood.  ? Your body makes too much uric acid.  ? You eat too many foods that are high in purines. These foods include organ meats, some seafood, and beer.  · Trauma or stress.  What increases the risk?  · Having a family history of gout.  · Being male and middle-aged.  · Being male and having gone through menopause.  · Being very overweight (obese).  · Drinking alcohol, especially beer.  · Not having enough water in the body (being dehydrated).  · Losing weight too quickly.  · Having an organ transplant.  · Having lead poisoning.  · Taking certain medicines.  · Having kidney disease.  · Having a skin condition called psoriasis.  What are the signs or symptoms?  An attack of acute gout usually happens in just one joint. The most common place is the big toe. Attacks often start at night. Other joints that may be affected include joints of the feet, ankle, knee, fingers, wrist, or elbow. Symptoms of an attack may include:  · Very bad pain.  · Warmth.  · Swelling.  · Stiffness.  · Shiny, red, or purple skin.  · Tenderness. The affected joint may be very painful to touch.  · Chills and fever.  Chronic gout may cause symptoms more often. More joints may be involved. You may also have white or yellow lumps (tophi) on your hands or feet or in other areas near your joints.  How is this  treated?  · Treatment for this condition has two phases: treating an acute attack and preventing future attacks.  · Acute gout treatment may include:  ? NSAIDs.  ? Steroids. These are taken by mouth or injected into a joint.  ? Colchicine. This medicine relieves pain and swelling. It can be given by mouth or through an IV tube.  · Preventive treatment may include:  ? Taking small doses of NSAIDs or colchicine daily.  ? Using a medicine that reduces uric acid levels in your blood.  ? Making changes to your diet. You may need to see a food expert (dietitian) about what to eat and drink to prevent gout.  Follow these instructions at home:  During a gout attack    · If told, put ice on the painful area:  ? Put ice in a plastic bag.  ? Place a towel between your skin and the bag.  ? Leave the ice on for 20 minutes, 2-3 times a day.  · Raise (elevate) the painful joint above the level of your heart as often as you can.  · Rest the joint as much as possible. If the joint is in your leg, you may be given crutches.  · Follow instructions from your doctor about what you cannot eat or   drink.  Avoiding future gout attacks  · Eat a low-purine diet. Avoid foods and drinks such as:  ? Liver.  ? Kidney.  ? Anchovies.  ? Asparagus.  ? Herring.  ? Mushrooms.  ? Mussels.  ? Beer.  · Stay at a healthy weight. If you want to lose weight, talk with your doctor. Do not lose weight too fast.  · Start or continue an exercise plan as told by your doctor.  Eating and drinking  · Drink enough fluids to keep your pee (urine) pale yellow.  · If you drink alcohol:  ? Limit how much you use to:  § 0-1 drink a day for women.  § 0-2 drinks a day for men.  ? Be aware of how much alcohol is in your drink. In the [place] one drink equals one 12 oz bottle of beer (355 mL), one 5 oz glass of wine (148 mL), or one 1½ oz glass of hard liquor (44 mL).  General instructions  · Take over-the-counter and prescription medicines only as told by your doctor.  · Do  not drive or use heavy machinery while taking prescription pain medicine.  · Return to your normal activities as told by your doctor. Ask your doctor what activities are safe for you.  · Keep all follow-up visits as told by your doctor. This is important.  Contact a doctor if:  · You have another gout attack.  · You still have symptoms of a gout attack after 10 days of treatment.  · You have problems (side effects) because of your medicines.  · You have chills or a fever.  · You have burning pain when you pee (urinate).  · You have pain in your lower back or belly.  Get help right away if:  · You have very bad pain.  · Your pain cannot be controlled.  · You cannot pee.  Summary  · Gout is painful swelling of the joints.  · The most common site of pain is the big toe, but it can affect other joints.  · Medicines and avoiding some foods can help to prevent and treat gout attacks.  This information is not intended to replace advice given to you by your health care provider. Make sure you discuss any questions you have with your health care provider.  Document Released: 11/11/2007 Document Revised: 08/24/2017 Document Reviewed: 08/24/2017  Elsevier Interactive Patient Education © 2019 Elsevier Inc.

## 2018-06-29 ENCOUNTER — Other Ambulatory Visit: Payer: Self-pay

## 2018-06-29 MED ORDER — RIVAROXABAN 20 MG PO TABS: 20.0000 mg | ORAL_TABLET | Freq: Every day | ORAL | 0 refills | Status: DC

## 2018-06-29 MED ORDER — RIVAROXABAN 20 MG PO TABS: 20.0000 mg | ORAL_TABLET | Freq: Every evening | ORAL | 6 refills | Status: DC

## 2018-06-29 NOTE — Telephone Encounter (Signed)
Please review for refill. Thanks!  

## 2018-06-30 ENCOUNTER — Ambulatory Visit: Payer: Self-pay | Admitting: Pharmacist

## 2018-06-30 DIAGNOSIS — I872 Venous insufficiency (chronic) (peripheral): Secondary | ICD-10-CM | POA: Diagnosis not present

## 2018-06-30 DIAGNOSIS — I5023 Acute on chronic systolic (congestive) heart failure: Secondary | ICD-10-CM

## 2018-06-30 NOTE — Patient Instructions (Signed)
Visit Information  Goals Addressed            This Visit's Progress     Patient Stated   . "I'm on a lot of medications" (pt-stated)       Current Barriers:  [name] Knowledge Deficits related to optimal medication management; o I mailed patient a pill box - he notes he received it, and that his wife's sister comes over and fills every where . Literacy Barriers - is able to spell out medication names to me and instructions over the phone, but reads slowly  . Financial Barriers - denies problems affording medications, but endorses difficulties with CPAP supplies and food   Pharmacist Clinical Goal(s):  [name] Over the next 90 days, patient will work with CCM team to address needs related to optimized medication management  Interventions: . Patient verbalized that his wife's sister can continue to fill the pill box for him. He has an appointment with PCP this coming week; I have asked him to bring the pill box to the appointment for review by me, and also for re-education if medication changes are made at this appointment. o AM: amiodarone, colchicine, gabapentin, potassium, metoprolol, meloxicam, Xarelto (notes AM is his most consistent meal), spironolactone, torsemide o Noon: gabapentin o Evening: metoprolol, potassium, atorvastatin, lisinopril, loratidine o Bedtime: trazodone  Patient Self Care Activities:  . Calls pharmacy for medication refills . Calls provider office for new concerns or questions  Please see past updates related to this goal by clicking on the "Past Updates" button in the selected goal         The patient verbalized understanding of instructions provided today and declined a print copy of patient instruction materials.   Plan:  - Will meet with patient next week at scheduled PCP visit  Catie Feliz Beam, PharmD Clinical Pharmacist [facility] [phone]

## 2018-06-30 NOTE — Chronic Care Management (AMB) (Signed)
  Chronic Care Management   Follow Up Note   06/30/2018 Name: [name] [name] MRN: [id number] DOB: October 23, 1960  Referred by: Dorcas Carrow, DO Reason for referral : Chronic Care Management (Medication Management)   [name] [name] is a 58 y.o. year old male who is a primary care patient of Dorcas Carrow, DO. The CCM team was consulted for assistance with chronic disease management and care coordination needs.    Contacted patient to see if he received pill box in the mail. He also asks if the social worker on the team could call him, as he has some questions for her.   Review of patient status, including review of consultants reports, relevant laboratory and other test results, and collaboration with appropriate care team members and the patient's provider was performed as part of comprehensive patient evaluation and provision of chronic care management services.    Goals Addressed            This Visit's Progress     Patient Stated   . "I'm on a lot of medications" (pt-stated)       Current Barriers:  [name] Knowledge Deficits related to optimal medication management; o I mailed patient a pill box - he notes he received it, and that his wife's sister comes over and fills every where . Literacy Barriers - is able to spell out medication names to me and instructions over the phone, but reads slowly  . Financial Barriers - denies problems affording medications, but endorses difficulties with CPAP supplies and food   Pharmacist Clinical Goal(s):  [name] Over the next 90 days, patient will work with CCM team to address needs related to optimized medication management  Interventions: . Patient verbalized that his wife's sister can continue to fill the pill box for him. He has an appointment with PCP this coming week; I have asked him to bring the pill box to the appointment for review by me, and also for re-education if medication changes are made at this appointment. o AM: amiodarone,  colchicine, gabapentin, potassium, metoprolol, meloxicam, Xarelto (notes AM is his most consistent meal), spironolactone, torsemide o Noon: gabapentin o Evening: metoprolol, potassium, atorvastatin, lisinopril, loratidine o Bedtime: trazodone  Patient Self Care Activities:  . Calls pharmacy for medication refills . Calls provider office for new concerns or questions  Please see past updates related to this goal by clicking on the "Past Updates" button in the selected goal         Plan:  - Will meet with patient next week at scheduled PCP visit  Catie Feliz Beam, PharmD Clinical Pharmacist [facility] [phone]

## 2018-07-04 ENCOUNTER — Ambulatory Visit (INDEPENDENT_AMBULATORY_CARE_PROVIDER_SITE_OTHER): Payer: Medicaid Other | Admitting: Family Medicine

## 2018-07-04 ENCOUNTER — Ambulatory Visit: Payer: Self-pay | Admitting: Licensed Clinical Social Worker

## 2018-07-04 ENCOUNTER — Encounter: Payer: Self-pay | Admitting: Family Medicine

## 2018-07-04 ENCOUNTER — Other Ambulatory Visit: Payer: Self-pay

## 2018-07-04 ENCOUNTER — Telehealth: Payer: Self-pay

## 2018-07-04 ENCOUNTER — Ambulatory Visit: Payer: Self-pay | Admitting: Pharmacist

## 2018-07-04 VITALS — BP 134/84 | HR 65 | Temp 98.7°F | Ht 68.0 in | Wt 393.0 lb

## 2018-07-04 DIAGNOSIS — J410 Simple chronic bronchitis: Secondary | ICD-10-CM

## 2018-07-04 DIAGNOSIS — S069X9S Unspecified intracranial injury with loss of consciousness of unspecified duration, sequela: Secondary | ICD-10-CM

## 2018-07-04 DIAGNOSIS — E119 Type 2 diabetes mellitus without complications: Secondary | ICD-10-CM | POA: Diagnosis not present

## 2018-07-04 DIAGNOSIS — R7989 Other specified abnormal findings of blood chemistry: Secondary | ICD-10-CM | POA: Diagnosis not present

## 2018-07-04 DIAGNOSIS — I5022 Chronic systolic (congestive) heart failure: Secondary | ICD-10-CM

## 2018-07-04 DIAGNOSIS — I872 Venous insufficiency (chronic) (peripheral): Secondary | ICD-10-CM | POA: Diagnosis not present

## 2018-07-04 DIAGNOSIS — I4819 Other persistent atrial fibrillation: Secondary | ICD-10-CM | POA: Diagnosis not present

## 2018-07-04 DIAGNOSIS — S069XAA Unspecified intracranial injury with loss of consciousness status unknown, initial encounter: Secondary | ICD-10-CM | POA: Insufficient documentation

## 2018-07-04 DIAGNOSIS — S069X9A Unspecified intracranial injury with loss of consciousness of unspecified duration, initial encounter: Secondary | ICD-10-CM | POA: Insufficient documentation

## 2018-07-04 DIAGNOSIS — L97911 Non-pressure chronic ulcer of unspecified part of right lower leg limited to breakdown of skin: Secondary | ICD-10-CM

## 2018-07-04 DIAGNOSIS — M1A9XX0 Chronic gout, unspecified, without tophus (tophi): Secondary | ICD-10-CM | POA: Diagnosis not present

## 2018-07-04 DIAGNOSIS — D649 Anemia, unspecified: Secondary | ICD-10-CM

## 2018-07-04 DIAGNOSIS — G894 Chronic pain syndrome: Secondary | ICD-10-CM

## 2018-07-04 DIAGNOSIS — L97921 Non-pressure chronic ulcer of unspecified part of left lower leg limited to breakdown of skin: Secondary | ICD-10-CM

## 2018-07-04 MED ORDER — GABAPENTIN 300 MG PO CAPS: 300.0000 mg | ORAL_CAPSULE | Freq: Three times a day (TID) | ORAL | 1 refills | Status: DC

## 2018-07-04 MED ORDER — SPIRONOLACTONE 25 MG PO TABS: 25.0000 mg | ORAL_TABLET | Freq: Every day | ORAL | 3 refills | Status: DC

## 2018-07-04 MED ORDER — METOPROLOL SUCCINATE ER 50 MG PO TB24: 50.0000 mg | ORAL_TABLET | Freq: Two times a day (BID) | ORAL | 0 refills | Status: DC

## 2018-07-04 MED ORDER — ALBUTEROL SULFATE HFA 108 (90 BASE) MCG/ACT IN AERS: 2.0000 | INHALATION_SPRAY | Freq: Four times a day (QID) | RESPIRATORY_TRACT | 3 refills | Status: DC | PRN

## 2018-07-04 MED ORDER — TORSEMIDE 20 MG PO TABS: 40.0000 mg | ORAL_TABLET | Freq: Every day | ORAL | 0 refills | Status: DC

## 2018-07-04 MED ORDER — BUDESONIDE-FORMOTEROL FUMARATE 160-4.5 MCG/ACT IN AERO: 2.0000 | INHALATION_SPRAY | Freq: Two times a day (BID) | RESPIRATORY_TRACT | 3 refills | Status: DC

## 2018-07-04 MED ORDER — MELOXICAM 15 MG PO TABS: 15.0000 mg | ORAL_TABLET | Freq: Every day | ORAL | 1 refills | Status: DC

## 2018-07-04 NOTE — Assessment & Plan Note (Signed)
UNNA boots in place today. Continue to work with home health. Call with any concerns.

## 2018-07-04 NOTE — Assessment & Plan Note (Signed)
Started on allopurinol. Rechecking labs today. Await results and treat as needed.

## 2018-07-04 NOTE — Assessment & Plan Note (Signed)
Has been having rectal bleeding. Concern about hemorrhoids. He has been having bleeding even without bowel movements. Encouraged patient to stop BCs since he's on xarelto. Checking labs including iron levels today. Await results. May need to see general surgery for banding pending results if bleeding continues when he stops his BCs.

## 2018-07-04 NOTE — Assessment & Plan Note (Signed)
Has not been taking his symbicort daily. Encouraged patient to take it daily, take albuterol as needed. Lungs clear today- continue to monitor closely.

## 2018-07-04 NOTE — Patient Instructions (Signed)
Visit Information  Goals Addressed            This Visit's Progress     Patient Stated   . "I'm on a lot of medications" (pt-stated)       Current Barriers:  [name] Knowledge Deficits related to optimal medication management; o Patient reports that he ran out of torsemide, and didn't recognize the name of spironolactone. Significant swelling visible today during PCP appointment . Literacy Barriers - is able to spell out medication names to me and instructions over the phone, but reads slowly  . Financial Barriers - previously denied problems affording medications, but today notes that he could not afford his $3 Medicaid copays, so hadn't picked up some of his medications. When asked if he had problems affording healthy foods, he noted that he didn't cook because he couldn't stand up long enough to cook anything  Pharmacist Clinical Goal(s):  [name] Over the next 90 days, patient will work with CCM team to address needs related to optimized medication management  Interventions: . Notified patient that he can inform a pharmacy that he cannot afford his copayments and they will waive Medicaid copayment. [facility] pharmacy, they confirmed that patient had just picked up the medications called in today by Dr. Laural Benes . Patient's wife's sister helps fill his pill box. I offered to contact her today to give my information for any medication-related questions, but patient declined, noting that she is very busy. I provided patient with my business card to pass along. o AM: amiodarone, colchicine, gabapentin, potassium, metoprolol, meloxicam, Xarelto (notes AM is his most consistent meal), spironolactone, torsemide o Noon: gabapentin o Evening: metoprolol, potassium, atorvastatin, lisinopril, loratidine o Bedtime: trazodone  Patient Self Care Activities:  . Calls pharmacy for medication refills . Calls provider office for new concerns or questions  Please see past updates related to this goal by  clicking on the "Past Updates" button in the selected goal         The patient verbalized understanding of instructions provided today and declined a print copy of patient instruction materials.   Plan:  - Will continue to collaborate with CCM team to support patient in financial and self-care concerns.  - Will follow up with patient in next 30 days for continued medication adherence support  Catie Feliz Beam, PharmD Clinical Pharmacist [facility] [phone]

## 2018-07-04 NOTE — Assessment & Plan Note (Signed)
Last TSH was over 7- will recheck levels today and treat as needed. He is having some symptoms. Await results.

## 2018-07-04 NOTE — Chronic Care Management (AMB) (Signed)
  Chronic Care Management   Follow Up Note   07/04/2018 Name: [name] [name] MRN: [id number] DOB: [birth year]  Referred by: [name], DO Reason for referral : Chronic Care Management (Medication Management)   [name] [name] is a 57 y.o. year old male who is a primary care patient of [name], DO. The CCM team was consulted for assistance with chronic disease management and care coordination needs.    Met with patient today during his face to face visit with Dr. Wynetta Emery.  Review of patient status, including review of consultants reports, relevant laboratory and other test results, and collaboration with appropriate care team members and the patient's provider was performed as part of comprehensive patient evaluation and provision of chronic care management services.    Goals Addressed            This Visit's Progress     Patient Stated   . "I'm on a lot of medications" (pt-stated)       Current Barriers:  [name] Knowledge Deficits related to optimal medication management; o Patient reports that he ran out of torsemide, and didn't recognize the name of spironolactone. Significant swelling visible today during PCP appointment . Literacy Barriers - is able to spell out medication names to me and instructions over the phone, but reads slowly  . Financial Barriers - previously denied problems affording medications, but today notes that he could not afford his $3 Medicaid copays, so hadn't picked up some of his medications. When asked if he had problems affording healthy foods, he noted that he didn't cook because he couldn't stand up long enough to cook anything  Pharmacist Clinical Goal(s):  [name] Over the next 90 days, patient will work with CCM team to address needs related to optimized medication management  Interventions: . Notified patient that he can inform a pharmacy that he cannot afford his copayments and they will waive Medicaid copayment. [place],  they confirmed that patient had just picked up the medications called in today by Dr. Wynetta Emery . Patient's wife's sister helps fill his pill box. I offered to contact her today to give my information for any medication-related questions, but patient declined, noting that she is very busy. I provided patient with my business card to pass along. o AM: amiodarone, colchicine, gabapentin, potassium, metoprolol, meloxicam, Xarelto (notes AM is his most consistent meal), spironolactone, torsemide o Noon: gabapentin o Evening: metoprolol, potassium, atorvastatin, lisinopril, loratidine o Bedtime: trazodone  Patient Self Care Activities:  . Calls pharmacy for medication refills . Calls provider office for new concerns or questions  Please see past updates related to this goal by clicking on the "Past Updates" button in the selected goal         Plan:  - Will continue to collaborate with CCM team to support patient in financial and self-care concerns.  - Will follow up with patient in next 30 days for continued medication adherence support  [name], PharmD Clinical Pharmacist [place] [phone]

## 2018-07-04 NOTE — Patient Instructions (Addendum)
Appointment with [name], PA-C  Tomorrow 5/20 10:30 AM [facility] at [facility]  [address]  [address]  [place], [place] [address]  [phone]    TELL THE PHARMACY YOU CAN'T AFFORD YOUR MEDICINE AND THEY WILL NOT MAKE YOU PAY YOUR COPAY  TAKE YOUR SYMBICORT EVERY DAY TAKE YOUR TORSEMIDE EVERY DAY TAKE YOUR SPIRONALACTONE EVERY DAY DO NOT TAKE ANY BC POWDERS SEE RYAN DUNN (Heart Doctor) TOMORROW COME SEE ME NEXT WEEK GO TO THE ER IF YOU CAN'T BREATHE

## 2018-07-04 NOTE — Assessment & Plan Note (Signed)
To be seeing chronic pain on 07/13/18- we are obtaining records from [place]. Await records.

## 2018-07-04 NOTE — Assessment & Plan Note (Signed)
Weight up 13lbs. Feeling very short of breath. Having increased swelling. Unclear if he's taking his spironalactone at all ("I don't even like the sound of that one- I've never heard of it"). Only taking his torsemide every other day. Lungs clear, but significant swelling- popping out of his unna boots. Advised him to take his torsemide and spironalactone daily. To see cardiology tomorrow. Appointment arranged and patient made aware. Will recheck in person next week. Warning signs for which to go to the ER discussed.

## 2018-07-04 NOTE — Chronic Care Management (AMB) (Signed)
  Chronic Care Management    Clinical Social Work Introduction Note  07/04/2018 Name: [name] [name] MRN: [id number] DOB: [birth year]  LCSW completed outreach attempt today to [name] [name] who is a 58 y.o. year old male primary care patient of Dorcas Carrow, DO. LCSW was consulted to provide information about [name] and [facility]. LCSW was unable to reach patient by phone and left a HIPPA compliant voice message encouraging patient to return call once available. LCSW completed chart review and saw that patient had a office visit with PCP and CCM Pharmacist today as well. LCSW will reschedule outreach for 07/07/2018.  Review of patient status, including review of consultants reports, other relevant assessments, and collaboration with appropriate care team members and the patient's provider was performed as part of comprehensive patient evaluation and provision of chronic care management services.    Follow Up Plan: SW will follow up with patient by phone over the next week  [name], BSW, MSW, LCSW [facility] [place]  [facility] [email or web address] Phone: [phone]

## 2018-07-04 NOTE — Assessment & Plan Note (Addendum)
States hit his head really badly in [place] and now has trouble remembering things- will get records from [place] ASAP. Continue to monitor. Already hooked into our chronic care management. Met with pharmacist again today. 

## 2018-07-04 NOTE — Assessment & Plan Note (Signed)
Heart sounds regular today- but no EKG done. Still taking his amiodarone. To see his cardiologist tomorrow. Continue to monitor. Call with any concerns.

## 2018-07-04 NOTE — Progress Notes (Signed)
BP 134/84   Pulse 65   Temp 98.7 F (37.1 C) (Oral)   Ht 5' 8"  (1.727 m)   Wt (!) 393 lb (178.3 kg)   SpO2 97%   BMI 59.76 kg/m    Subjective:    Patient ID: [name] [name], male    DOB: 09-25-1960, 58 y.o.   MRN: [id number]  HPI: [name] [name] is a 58 y.o. male  Chief Complaint  Patient presents with  . Congestive Heart Failure  . Hypothyroidism  . Gout   Heart failure/A Fib- saw Dr. Saunders Revel on 06/08/18 via telemedicine. Had been alternating his spironalactone and torsemide rather than taking them both daily. This was OK with cardiology about a month ago as his symptoms had improved at that time. Dr. Saunders Revel changed his amiodarone to 252m daily with plans to get him into the office for evaluation and possible cardioversion. It appears that [name] his appointment with Dr. ESaunders Revelat the beginning of May and does not have a follow up. He has put on 13 lbs since last check. He notes that he is very short of breah  COPD- started on symbicort last visit, he not taking it daily. Notes that he just takes it as needed. Still very short of breath.  Gout- in acute flare last visit. Uric acid 10.1- started on allopurinol last visit. He states that he is doing better with that. Has a "gout flare" in his R middle finger right now. Just hurts- not red or swollen.   ANEMIA- has known hemorrhoids with occasional blood in his BM and in his underwear. He had been taking BC powders and is on rivarobaxan.  He is still bleeding. Bleeding with most bowel movements. Just bleeding any way Anemia status: unknown Etiology of anemia: likely chronic blood loss Duration of anemia treatment: not on anything Iron supplementation side effects: not on anything Severity of anemia: moderate Fatigue: yes Decreased exercise tolerance: yes  Dyspnea on exertion: yes Palpitations: yes Bleeding: yes Pica: no  [place] that [place]  ABNORMAL THYROID- labs checked  showed TSH of around 7- not on any medicine Thyroid control status:unknown Satisfied with current treatment? no Medication side effects: not on anything Recent dose adjustment: not on anything Fatigue: yes Cold intolerance: no Heat intolerance: yes Weight gain: yes Weight loss: no Constipation: no Diarrhea/loose stools: no Palpitations: yes Lower extremity edema: yes Anxiety/depressed mood: yes   Relevant past medical, surgical, family and social history reviewed and updated as indicated. Interim medical history since our last visit reviewed. Allergies and medications reviewed and updated.  Review of Systems  Constitutional: Positive for fatigue. Negative for activity change, appetite change, chills, diaphoresis, fever and unexpected weight change.  HENT: Negative.   Respiratory: Positive for shortness of breath. Negative for apnea, cough, choking, chest tightness, wheezing and stridor.   Cardiovascular: Positive for palpitations and leg swelling. Negative for chest pain.  Gastrointestinal: Positive for anal bleeding and blood in stool. Negative for abdominal distention, abdominal pain, constipation, diarrhea, nausea, rectal pain and vomiting.  Endocrine: Positive for heat intolerance. Negative for cold intolerance, polydipsia, polyphagia and polyuria.  Genitourinary: Negative.   Musculoskeletal: Positive for arthralgias, back pain and myalgias. Negative for gait problem, joint swelling, neck pain and neck stiffness.  Skin: Negative.   Neurological: Negative.   Psychiatric/Behavioral: Positive for dysphoric mood and sleep disturbance. Negative for agitation, behavioral problems, confusion, decreased concentration, hallucinations, self-injury and suicidal ideas. The patient is not  nervous/anxious and is not hyperactive.     Per HPI unless specifically indicated above     Objective:    BP 134/84   Pulse 65   Temp 98.7 F (37.1 C) (Oral)   Ht 5' 8"  (1.727 m)   Wt (!) 393 lb  (178.3 kg)   SpO2 97%   BMI 59.76 kg/m   Wt Readings from Last 3 Encounters:  07/04/18 (!) 393 lb (178.3 kg)  06/14/18 (!) 380 lb (172.4 kg)  06/08/18 (!) 380 lb (172.4 kg)    Physical Exam Vitals signs and nursing note reviewed.  Constitutional:      General: He is not in acute distress.    Appearance: Normal appearance. He is obese. He is not ill-appearing, toxic-appearing or diaphoretic.  HENT:     Head: Normocephalic and atraumatic.     Right Ear: External ear normal.     Left Ear: External ear normal.     Nose: Nose normal.     Mouth/Throat:     Mouth: Mucous membranes are moist.     Pharynx: Oropharynx is clear.  Eyes:     General: No scleral icterus.       Right eye: No discharge.        Left eye: No discharge.     Extraocular Movements: Extraocular movements intact.     Conjunctiva/sclera: Conjunctivae normal.     Pupils: Pupils are equal, round, and reactive to light.  Neck:     Musculoskeletal: Normal range of motion and neck supple.  Cardiovascular:     Rate and Rhythm: Normal rate.     Pulses: Normal pulses.     Heart sounds: Normal heart sounds. No murmur. No friction rub. No gallop.   Pulmonary:     Effort: No respiratory distress.     Breath sounds: Normal breath sounds. No stridor. No wheezing, rhonchi or rales.     Comments: visably short of breath with exertion Chest:     Chest wall: No tenderness.  Musculoskeletal: Normal range of motion.        General: Swelling (3+ edema bilaterally, unna boots in place- legs swelling over unna boots bilaterally) present.  Skin:    General: Skin is warm and dry.     Capillary Refill: Capillary refill takes less than 2 seconds.     Coloration: Skin is not jaundiced or pale.     Findings: No bruising, erythema, lesion or rash.  Neurological:     General: No focal deficit present.     Mental Status: He is alert and oriented to person, place, and time. Mental status is at baseline.  Psychiatric:        Mood and  Affect: Mood normal.        Behavior: Behavior normal.        Thought Content: Thought content normal.        Judgment: Judgment normal.     Results for orders placed or performed in visit on 06/14/18  Microscopic Examination  Result Value Ref Range   WBC, UA 0-5 0 - 5 /hpf   RBC None seen 0 - 2 /hpf   Epithelial Cells (non renal) 0-10 0 - 10 /hpf   Bacteria, UA Few (A) None seen/Few  Urine Culture, Reflex  Result Value Ref Range   Urine Culture, Routine Final report    Organism ID, Bacteria Comment   Hepatitis C Antibody  Result Value Ref Range   Hep C Virus Ab <0.1 0.0 - 0.9  s/co ratio  Uric acid  Result Value Ref Range   Uric Acid 10.1 (H) 3.7 - 8.6 mg/dL  UA/M w/rflx Culture, Routine  Result Value Ref Range   Specific Gravity, UA <1.005 (L) 1.005 - 1.030   pH, UA 5.0 5.0 - 7.5   Color, UA Yellow Yellow   Appearance Ur Clear Clear   Leukocytes,UA Trace (A) Negative   Protein,UA Negative Negative/Trace   Glucose, UA Negative Negative   Ketones, UA Negative Negative   RBC, UA 2+ (A) Negative   Bilirubin, UA Negative Negative   Urobilinogen, Ur 0.2 0.2 - 1.0 mg/dL   Nitrite, UA Negative Negative   Microscopic Examination See below:    Urinalysis Reflex Comment   TSH  Result Value Ref Range   TSH 7.070 (H) 0.450 - 4.500 uIU/mL  PSA  Result Value Ref Range   Prostate Specific Ag, Serum 0.4 0.0 - 4.0 ng/mL  Microalbumin, Urine Waived  Result Value Ref Range   Microalb, Ur Waived 10 0 - 19 mg/L   Creatinine, Urine Waived 10 10 - 300 mg/dL   Microalb/Creat Ratio <30 <30 mg/g  Lipid Panel w/o Chol/HDL Ratio  Result Value Ref Range   Cholesterol, Total 122 100 - 199 mg/dL   Triglycerides 85 0 - 149 mg/dL   HDL 62 >39 mg/dL   VLDL Cholesterol Cal 17 5 - 40 mg/dL   LDL Calculated 43 0 - 99 mg/dL  Comprehensive metabolic panel  Result Value Ref Range   Glucose 95 65 - 99 mg/dL   BUN 22 6 - 24 mg/dL   Creatinine, Ser 1.24 0.76 - 1.27 mg/dL   GFR calc non Af Amer 64  >59 mL/min/1.73   GFR calc Af Amer 74 >59 mL/min/1.73   BUN/Creatinine Ratio 18 9 - 20   Sodium 137 134 - 144 mmol/L   Potassium 5.5 (H) 3.5 - 5.2 mmol/L   Chloride 98 96 - 106 mmol/L   CO2 27 20 - 29 mmol/L   Calcium 9.3 8.7 - 10.2 mg/dL   Total Protein 6.6 6.0 - 8.5 g/dL   Albumin 3.7 (L) 3.8 - 4.9 g/dL   Globulin, Total 2.9 1.5 - 4.5 g/dL   Albumin/Globulin Ratio 1.3 1.2 - 2.2   Bilirubin Total <0.2 0.0 - 1.2 mg/dL   Alkaline Phosphatase 66 39 - 117 IU/L   AST 20 0 - 40 IU/L   ALT 19 0 - 44 IU/L  CBC with Differential/Platelet  Result Value Ref Range   WBC 6.1 3.4 - 10.8 x10E3/uL   RBC 4.08 (L) 4.14 - 5.80 x10E6/uL   Hemoglobin 10.2 (L) 13.0 - 17.7 g/dL   Hematocrit 33.1 (L) 37.5 - 51.0 %   MCV 81 79 - 97 fL   MCH 25.0 (L) 26.6 - 33.0 pg   MCHC 30.8 (L) 31.5 - 35.7 g/dL   RDW 17.4 (H) 11.6 - 15.4 %   Platelets 504 (H) 150 - 450 x10E3/uL   Neutrophils 58 Not Estab. %   Lymphs 21 Not Estab. %   Monocytes 13 Not Estab. %   Eos 5 Not Estab. %   Basos 2 Not Estab. %   Neutrophils Absolute 3.6 1.4 - 7.0 x10E3/uL   Lymphocytes Absolute 1.3 0.7 - 3.1 x10E3/uL   Monocytes Absolute 0.8 0.1 - 0.9 x10E3/uL   EOS (ABSOLUTE) 0.3 0.0 - 0.4 x10E3/uL   Basophils Absolute 0.1 0.0 - 0.2 x10E3/uL   Immature Granulocytes 1 Not Estab. %   Immature Grans (Abs)  0.0 0.0 - 0.1 x10E3/uL  Bayer DCA Hb A1c Waived  Result Value Ref Range   HB A1C (BAYER DCA - WAIVED) 6.0 <7.0 %      Assessment & Plan:   Problem List Items Addressed This Visit      Cardiovascular and Mediastinum   Chronic systolic heart failure ([place])    Weight up 13lbs. Feeling very short of breath. Having increased swelling. Unclear if he's taking his spironalactone at all ("I don't even like the sound of that one- I've never heard of it"). Only taking his torsemide every other day. Lungs clear, but significant swelling- popping out of his unna boots. Advised him to take his torsemide and spironalactone daily. To see  cardiology tomorrow. Appointment arranged and patient made aware. Will recheck in person next week. Warning signs for which to go to the ER discussed.      Relevant Medications   torsemide (DEMADEX) 20 MG tablet   spironolactone (ALDACTONE) 25 MG tablet   metoprolol succinate (TOPROL-XL) 50 MG 24 hr tablet   Persistent atrial fibrillation    Heart sounds regular today- but no EKG done. Still taking his amiodarone. To see his cardiologist tomorrow. Continue to monitor. Call with any concerns.       Relevant Medications   torsemide (DEMADEX) 20 MG tablet   spironolactone (ALDACTONE) 25 MG tablet   metoprolol succinate (TOPROL-XL) 50 MG 24 hr tablet     Respiratory   COPD (chronic obstructive pulmonary disease) (HCC) (Chronic)    Has not been taking his symbicort daily. Encouraged patient to take it daily, take albuterol as needed. Lungs clear today- continue to monitor closely.      Relevant Medications   albuterol (VENTOLIN HFA) 108 (90 Base) MCG/ACT inhaler   budesonide-formoterol (SYMBICORT) 160-4.5 MCG/ACT inhaler     Endocrine   Diet-controlled diabetes mellitus ([place])    Started on atorvastatin last visit. Rechecking cholesterol and CMP today. Await results.       Relevant Orders   Lipid Panel w/o Chol/HDL Ratio   Comprehensive metabolic panel     Nervous and Auditory   TBI (traumatic brain injury) ([place])    States hit his head really badly in [place] and now has trouble remembering things- will get records from [place] ASAP. Continue to monitor. Already hooked into our chronic care management. Met with pharmacist again today.        Musculoskeletal and Integument   Ulcers of both lower extremities, limited to breakdown of skin ([place])    UNNA boots in place today. Continue to work with home health. Call with any concerns.         Other   Gout - Primary    Started on allopurinol. Rechecking labs today. Await results and treat as needed.       Relevant Orders   Uric  acid   Chronic pain    To be seeing chronic pain on 07/13/18- we are obtaining records from [place]. Await records.       Relevant Medications   meloxicam (MOBIC) 15 MG tablet   gabapentin (NEURONTIN) 300 MG capsule   Anemia    Has been having rectal bleeding. Concern about hemorrhoids. He has been having bleeding even without bowel movements. Encouraged patient to stop BCs since he's on xarelto. Checking labs including iron levels today. Await results. May need to see general surgery for banding pending results if bleeding continues when he stops his BCs.      Relevant Orders  CBC with Differential/Platelet   Iron and TIBC   Ferritin   B12   Abnormal thyroid blood test    Last TSH was over 7- will recheck levels today and treat as needed. He is having some symptoms. Await results.       Relevant Orders   TSH       Follow up plan: Return 1 week follow up with me in person, for Records release from [place] Please.

## 2018-07-04 NOTE — Assessment & Plan Note (Signed)
Started on atorvastatin last visit. Rechecking cholesterol and CMP today. Await results.

## 2018-07-05 ENCOUNTER — Ambulatory Visit: Payer: Self-pay | Admitting: Licensed Clinical Social Worker

## 2018-07-05 ENCOUNTER — Telehealth: Payer: Self-pay | Admitting: Family Medicine

## 2018-07-05 DIAGNOSIS — E119 Type 2 diabetes mellitus without complications: Secondary | ICD-10-CM

## 2018-07-05 LAB — CBC WITH DIFFERENTIAL/PLATELET
Basophils Absolute: 0.1 10*3/uL (ref 0.0–0.2)
Basos: 1 %
EOS (ABSOLUTE): 0.4 10*3/uL (ref 0.0–0.4)
Eos: 7 %
Hematocrit: 37.1 % — ABNORMAL LOW (ref 37.5–51.0)
Hemoglobin: 11.1 g/dL — ABNORMAL LOW (ref 13.0–17.7)
Immature Grans (Abs): 0 10*3/uL (ref 0.0–0.1)
Immature Granulocytes: 0 %
Lymphocytes Absolute: 1.2 10*3/uL (ref 0.7–3.1)
Lymphs: 19 %
MCH: 24.9 pg — ABNORMAL LOW (ref 26.6–33.0)
MCHC: 29.9 g/dL — ABNORMAL LOW (ref 31.5–35.7)
MCV: 83 fL (ref 79–97)
Monocytes Absolute: 0.8 10*3/uL (ref 0.1–0.9)
Monocytes: 14 %
Neutrophils Absolute: 3.7 10*3/uL (ref 1.4–7.0)
Neutrophils: 59 %
Platelets: 267 10*3/uL (ref 150–450)
RBC: 4.46 x10E6/uL (ref 4.14–5.80)
RDW: 17.8 % — ABNORMAL HIGH (ref 11.6–15.4)
WBC: 6.1 10*3/uL (ref 3.4–10.8)

## 2018-07-05 LAB — COMPREHENSIVE METABOLIC PANEL
ALT: 9 IU/L (ref 0–44)
AST: 11 IU/L (ref 0–40)
Albumin/Globulin Ratio: 1.4 (ref 1.2–2.2)
Albumin: 4.2 g/dL (ref 3.8–4.9)
Alkaline Phosphatase: 91 IU/L (ref 39–117)
BUN/Creatinine Ratio: 15 (ref 9–20)
BUN: 19 mg/dL (ref 6–24)
Bilirubin Total: 0.3 mg/dL (ref 0.0–1.2)
CO2: 24 mmol/L (ref 20–29)
Calcium: 9.8 mg/dL (ref 8.7–10.2)
Chloride: 99 mmol/L (ref 96–106)
Creatinine, Ser: 1.3 mg/dL — ABNORMAL HIGH (ref 0.76–1.27)
GFR calc Af Amer: 70 mL/min/{1.73_m2} (ref >59)
GFR calc non Af Amer: 61 mL/min/{1.73_m2} (ref >59)
Globulin, Total: 3.1 g/dL (ref 1.5–4.5)
Glucose: 113 mg/dL — ABNORMAL HIGH (ref 65–99)
Potassium: 4.8 mmol/L (ref 3.5–5.2)
Sodium: 138 mmol/L (ref 134–144)
Total Protein: 7.3 g/dL (ref 6.0–8.5)

## 2018-07-05 LAB — LIPID PANEL W/O CHOL/HDL RATIO
Cholesterol, Total: 156 mg/dL (ref 100–199)
HDL: 63 mg/dL (ref >39)
LDL Calculated: 70 mg/dL (ref 0–99)
Triglycerides: 114 mg/dL (ref 0–149)
VLDL Cholesterol Cal: 23 mg/dL (ref 5–40)

## 2018-07-05 LAB — VITAMIN B12: Vitamin B-12: 318 pg/mL (ref 232–1245)

## 2018-07-05 LAB — FERRITIN: Ferritin: 11 ng/mL — ABNORMAL LOW (ref 30–400)

## 2018-07-05 LAB — IRON AND TIBC
Iron Saturation: 6 % — CL (ref 15–55)
Iron: 27 ug/dL — ABNORMAL LOW (ref 38–169)
Total Iron Binding Capacity: 467 ug/dL — ABNORMAL HIGH (ref 250–450)
UIBC: 440 ug/dL — ABNORMAL HIGH (ref 111–343)

## 2018-07-05 LAB — TSH: TSH: 9.48 u[IU]/mL — ABNORMAL HIGH (ref 0.450–4.500)

## 2018-07-05 LAB — URIC ACID: Uric Acid: 8.6 mg/dL (ref 3.7–8.6)

## 2018-07-05 MED ORDER — LEVOTHYROXINE SODIUM 25 MCG PO TABS: 25.0000 ug | ORAL_TABLET | Freq: Every day | ORAL | 2 refills | Status: DC

## 2018-07-05 MED ORDER — FERROUS SULFATE 325 (65 FE) MG PO TABS: 325.0000 mg | ORAL_TABLET | Freq: Two times a day (BID) | ORAL | 3 refills | Status: DC

## 2018-07-05 NOTE — Telephone Encounter (Signed)
Called and left patient a VM asking for him to please return my call.  

## 2018-07-05 NOTE — Telephone Encounter (Signed)
Patient notified

## 2018-07-05 NOTE — Telephone Encounter (Signed)
Please let him know that his thyroid is not working the way it's supposed to and his iron is very low. I'm going to send him through some thyroid medicine and also some iron pills to try to get him back to normal. Everything else looks stable. Thanks!

## 2018-07-05 NOTE — Progress Notes (Deleted)
Cardiology Office Note Date:  07/05/2018  Patient ID:  [name] [name], DOB 1960-07-24, MRN [id number] PCP:  Dorcas Carrow, DO  Cardiologist:  Dr. Okey Dupre, MD  ***refresh   Chief Complaint: Follow up  History of Present Illness: [name] [name] is a 58 y.o. male with history of HFrEF secondary to presumed NICM, persistent Afib on amiodarone and Xarelto, COPD, morbid obesity, and sleep apnea on CPAP who presents for follow up of his Afib and CHF.   He was originally admitted in 03/2018 with acute on chronic HFrEF in the setting of Afib. Following diuresis, he underwent DCCV, though failed to follow up after discharge. Echo at that time showed ***. He was readmitted in 05/2018 with chest pain and SOB in the setting of recurrent Afib with concern for viral illness. He tested negative for COVID-19. He was rate controlled and started on amiodarone load. Repeat DCCV was deferred given adequate rate control and need for amiodarone loading. He was seen in telemedicine follow up on 06/08/2018 noting subjective fever and chronic arthralgias with chronic BC Powder use. He denied any exertional chest pain, though did notice exertional palpitations and dyspnea. His leg edema had improved and his weight was down to 380 pounds. He also reported occasional BRBPR and had previously noted this in the past leading to a colonoscopy that found hemorrhoids. Bleeding was now occurring even without a BM. It was noted he was taking torsemide and spironolactone on alternating days rather than daily. Given he was tolerating this, no changes were made.    *** Labs ekg dccv Weight bleeding   Past Medical History:  Diagnosis Date  . Asthma   . Brain damage   . COPD (chronic obstructive pulmonary disease) (HCC)   . HFrEF (heart failure with reduced ejection fraction) (HCC)    a. 03/2018 Echo: EF 25-30%, diff HK. Mod dil LA.  [name] Persistent atrial fibrillation    a. 03/2018 s/p DCCV; b. CHA2DS2VASc = 1-->Xarelto; c. 05/2018  recurrent afib-->Amio initiated;   . Sleep apnea     Past Surgical History:  Procedure Laterality Date  . CARDIOVERSION N/A 03/24/2018   Procedure: CARDIOVERSION;  Surgeon: Antonieta Iba, MD;  Location: [facility] ORS;  Service: Cardiovascular;  Laterality: N/A;  . hearnia repair     X 3- total of two surgeries  . LEG SURGERY      No outpatient medications have been marked as taking for the 07/11/18 encounter (Appointment) with Sondra Barges, PA-C.    Allergies:   Patient has no known allergies.   Social History:  The patient  reports that he has never smoked. He has never used smokeless tobacco. He reports that he does not drink alcohol or use drugs.   Family History:  The patient's family history includes Heart attack in his maternal grandfather and maternal grandmother; Heart failure in his mother; Lung cancer in his father and mother.  ROS:   ROS   PHYSICAL EXAM: *** VS:  There were no vitals taken for this visit. BMI: There is no height or weight on file to calculate BMI.  Physical Exam   EKG:  Was ordered and interpreted by me today. Shows ***  Recent Labs: 05/29/2018: B Natriuretic Peptide 289.0; Magnesium 1.7 07/04/2018: ALT 9; BUN 19; Creatinine, Ser 1.30; Hemoglobin 11.1; Platelets 267; Potassium 4.8; Sodium 138; TSH 9.480  01/28/2018: Total CHOL/HDL Ratio 2.3; VLDL 10 07/04/2018: Cholesterol, Total 156; HDL 63; LDL Calculated 70; Triglycerides 114   Estimated Creatinine Clearance: 99.7 mL/min (  A) (by C-G formula based on SCr of 1.3 mg/dL (H)).   Wt Readings from Last 3 Encounters:  07/04/18 (!) 393 lb (178.3 kg)  06/14/18 (!) 380 lb (172.4 kg)  06/08/18 (!) 380 lb (172.4 kg)     Other studies reviewed: Additional studies/records reviewed today include: summarized above  ASSESSMENT AND PLAN:  1. ***  Disposition: F/u with Dr. [name] or an APP in ***.  Current medicines are reviewed at length with the patient today.  The patient did not have any concerns regarding  medicines.  Signed, [name], PA-C 07/05/2018 2:39 PM     [facility] [place] [address] [place], [place] [address] [phone]

## 2018-07-05 NOTE — Chronic Care Management (AMB) (Signed)
  Chronic Care Management    Clinical Social Work General Note  07/05/2018 Name: [name] [name] MRN: [id number] DOB: Sep 07, 1960  [name] [name] is a 58 y.o. year old male who is a primary care patient of Dorcas Carrow, DO. The CCM was consulted to assist the patient with Grief Counseling and In Home Support. LCSW received incoming return call from patient on 07/05/18. HIPPA verifications received. LCSW informed patient that PCS application has been sent to PCP. PCP is agreeable to fax completed application to [facility] once completed. LCSW continues to collaborate with PCP and CCM Pharmacist in order to help patient with meeting his needs.   Review of patient status, including review of consultants reports, relevant laboratory and other test results, and collaboration with appropriate care team members and the patient's provider was performed as part of comprehensive patient evaluation and provision of chronic care management services.     Follow Up Plan: SW will follow up with patient by phone over the next week to assess grief and provide ongoing support      [name], BSW, MSW, LCSW [facility] [place]  [email or web address] Phone: [phone]

## 2018-07-06 DIAGNOSIS — I872 Venous insufficiency (chronic) (peripheral): Secondary | ICD-10-CM | POA: Diagnosis not present
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
## 2018-07-07 ENCOUNTER — Ambulatory Visit: Payer: Self-pay | Admitting: Licensed Clinical Social Worker

## 2018-07-07 ENCOUNTER — Telehealth: Payer: Self-pay | Admitting: Family Medicine

## 2018-07-07 ENCOUNTER — Telehealth: Payer: Self-pay

## 2018-07-07 NOTE — Telephone Encounter (Signed)
Copied from CRM [id number]. Topic: Quick Communication - Rx Refill/Question >> Jul 07, 2018  3:55 PM Tamela Oddi wrote: Medication: amiodarone (PACERONE) 200 MG tablet  Patient called to request a refill for the above medication  Preferred Pharmacy (with phone number or street name): [facility] [address]), [place] - [address] [phone] (Phone) [phone] (Fax)

## 2018-07-07 NOTE — Chronic Care Management (AMB) (Signed)
  Chronic Care Management    Clinical Social Work General Note  07/07/2018 Name: [name] [name] MRN: [id number] DOB: 11/05/1960  [name] [name] is a 58 y.o. year old male who is a primary care patient of Dorcas Carrow, DO. The CCM was consulted to assist the patient with In Home Support.  LCSW completed outreach call to patient and was unable to reach patient successfully. HIPPA compliant voice message left providing update on patient's PCS application status. LCSW rescheduled CCM SW appointment as well.   Review of patient status, including review of consultants reports, relevant laboratory and other test results, and collaboration with appropriate care team members and the patient's provider was performed as part of comprehensive patient evaluation and provision of chronic care management services.    Follow Up Plan: SW will follow up with patient by phone over the next 2 weeks      [name], BSW, MSW, LCSW [facility] [place]  [facility] [email or web address] Phone: [phone]

## 2018-07-11 ENCOUNTER — Telehealth: Payer: Self-pay | Admitting: Internal Medicine

## 2018-07-11 ENCOUNTER — Ambulatory Visit: Payer: Medicaid Other | Admitting: Physician Assistant

## 2018-07-11 ENCOUNTER — Other Ambulatory Visit: Payer: Self-pay

## 2018-07-11 DIAGNOSIS — I872 Venous insufficiency (chronic) (peripheral): Secondary | ICD-10-CM | POA: Diagnosis not present

## 2018-07-11 MED ORDER — AMIODARONE HCL 200 MG PO TABS: 200.0000 mg | ORAL_TABLET | Freq: Every day | ORAL | 1 refills | Status: DC

## 2018-07-11 NOTE — Telephone Encounter (Signed)
Spoke w/ pharmacy. They will forward to cardiologist.

## 2018-07-11 NOTE — Telephone Encounter (Signed)
He still had some AKI when PCP checked them. He will likely need follow up of that when we see him.

## 2018-07-11 NOTE — Telephone Encounter (Signed)
*  STAT* If patient is at the pharmacy, call can be transferred to refill team.   1. Which medications need to be refilled? (please list name of each medication and dose if known) AMIODARONE  2. Which pharmacy/location (including street and city if local pharmacy) is medication to be sent to? [facility]  3. Do they need a 30 day or 90 day supply? 90

## 2018-07-11 NOTE — Telephone Encounter (Signed)
Patient has lab results from pcp and says he is a hard stick.  Please confirm with patient if ok to use these or if repeat needed

## 2018-07-11 NOTE — Telephone Encounter (Signed)
He needs to get this from his cardiologist

## 2018-07-12 ENCOUNTER — Encounter: Payer: Self-pay | Admitting: Student in an Organized Health Care Education/Training Program

## 2018-07-12 ENCOUNTER — Ambulatory Visit: Payer: Medicaid Other | Admitting: Physician Assistant

## 2018-07-13 ENCOUNTER — Telehealth: Payer: Self-pay | Admitting: *Deleted

## 2018-07-13 ENCOUNTER — Other Ambulatory Visit: Payer: Self-pay

## 2018-07-13 ENCOUNTER — Encounter: Payer: Self-pay | Admitting: Student in an Organized Health Care Education/Training Program

## 2018-07-13 ENCOUNTER — Ambulatory Visit
Payer: Medicaid Other | Attending: Student in an Organized Health Care Education/Training Program | Admitting: Student in an Organized Health Care Education/Training Program

## 2018-07-13 DIAGNOSIS — L97921 Non-pressure chronic ulcer of unspecified part of left lower leg limited to breakdown of skin: Secondary | ICD-10-CM

## 2018-07-13 DIAGNOSIS — M542 Cervicalgia: Secondary | ICD-10-CM

## 2018-07-13 DIAGNOSIS — G894 Chronic pain syndrome: Secondary | ICD-10-CM

## 2018-07-13 DIAGNOSIS — G4733 Obstructive sleep apnea (adult) (pediatric): Secondary | ICD-10-CM

## 2018-07-13 DIAGNOSIS — I4819 Other persistent atrial fibrillation: Secondary | ICD-10-CM | POA: Diagnosis not present

## 2018-07-13 DIAGNOSIS — I872 Venous insufficiency (chronic) (peripheral): Secondary | ICD-10-CM | POA: Diagnosis not present

## 2018-07-13 DIAGNOSIS — M5442 Lumbago with sciatica, left side: Secondary | ICD-10-CM

## 2018-07-13 DIAGNOSIS — M5441 Lumbago with sciatica, right side: Secondary | ICD-10-CM

## 2018-07-13 DIAGNOSIS — G8929 Other chronic pain: Secondary | ICD-10-CM | POA: Insufficient documentation

## 2018-07-13 DIAGNOSIS — L97911 Non-pressure chronic ulcer of unspecified part of right lower leg limited to breakdown of skin: Secondary | ICD-10-CM

## 2018-07-13 DIAGNOSIS — M25562 Pain in left knee: Secondary | ICD-10-CM

## 2018-07-13 DIAGNOSIS — Z86711 Personal history of pulmonary embolism: Secondary | ICD-10-CM | POA: Diagnosis not present

## 2018-07-13 DIAGNOSIS — M546 Pain in thoracic spine: Secondary | ICD-10-CM | POA: Diagnosis not present

## 2018-07-13 DIAGNOSIS — S069X9S Unspecified intracranial injury with loss of consciousness of unspecified duration, sequela: Secondary | ICD-10-CM | POA: Diagnosis not present

## 2018-07-13 DIAGNOSIS — M25561 Pain in right knee: Secondary | ICD-10-CM

## 2018-07-13 DIAGNOSIS — J410 Simple chronic bronchitis: Secondary | ICD-10-CM | POA: Diagnosis not present

## 2018-07-13 DIAGNOSIS — Z86718 Personal history of other venous thrombosis and embolism: Secondary | ICD-10-CM

## 2018-07-13 HISTORY — DX: Other chronic pain: G89.29

## 2018-07-13 HISTORY — DX: Pain in left knee: M25.562

## 2018-07-13 HISTORY — DX: Cervicalgia: M54.2

## 2018-07-13 NOTE — Telephone Encounter (Signed)

## 2018-07-14 ENCOUNTER — Encounter: Payer: Self-pay | Admitting: Family Medicine

## 2018-07-14 ENCOUNTER — Ambulatory Visit: Payer: Medicaid Other | Admitting: Family Medicine

## 2018-07-14 ENCOUNTER — Other Ambulatory Visit: Payer: Self-pay

## 2018-07-14 VITALS — BP 141/72 | HR 56 | Temp 99.1°F | Ht 68.0 in | Wt 393.0 lb

## 2018-07-14 DIAGNOSIS — J410 Simple chronic bronchitis: Secondary | ICD-10-CM | POA: Diagnosis not present

## 2018-07-14 DIAGNOSIS — I5022 Chronic systolic (congestive) heart failure: Secondary | ICD-10-CM | POA: Diagnosis not present

## 2018-07-14 NOTE — Assessment & Plan Note (Signed)
Lungs clear. SOB likely multi-factorial and in part due to deconditioning. Would likely benefit from PT/cardiac rehab after COVID Pandemic. Continue symbicort. Call with any concerns.

## 2018-07-14 NOTE — Assessment & Plan Note (Signed)
Lungs clear and legs less swollen. Continue torsemide and spironalactone. Call with any concerns. Follow up with cardiology as scheduled on Tuesday. Call with any concerns.

## 2018-07-14 NOTE — Progress Notes (Signed)
BP (!) 141/72   Pulse (!) 56   Temp 99.1 F (37.3 C) (Oral)   Ht 5\' 8"  (1.727 m)   Wt (!) 393 lb (178.3 kg)   SpO2 99%   BMI 59.76 kg/m    Subjective:    Patient ID: [name] [name], male    DOB: [birth year], 58 y.o.   MRN: [id number]  HPI: [name] [name] is a 58 y.o. male  Chief Complaint  Patient presents with  . Follow-up    1w   Here today for follow up on his swelling. Has not seen cardiology yet- due to see them on Tuesday. He states that his SOB is no better. He is fine when he's sitting, but the minute he gets up to walk around he is short of breath. He states that he has been taking both his spironalactone and his torsemide, but has not lost any weight. O2 is good. He states that he is using symbicort every day. Not seeming to be helping. No gout flares. Otherwise seems to be doing well. No other concerns or complaints at this time.   Relevant past medical, surgical, family and social history reviewed and updated as indicated. Interim medical history since our last visit reviewed. Allergies and medications reviewed and updated.  Review of Systems  Constitutional: Negative.   Respiratory: Negative.   Cardiovascular: Positive for leg swelling. Negative for chest pain and palpitations.  Musculoskeletal: Negative.   Neurological: Negative.   Psychiatric/Behavioral: Negative.     Per HPI unless specifically indicated above     Objective:    BP (!) 141/72   Pulse (!) 56   Temp 99.1 F (37.3 C) (Oral)   Ht 5\' 8"  (1.727 m)   Wt (!) 393 lb (178.3 kg)   SpO2 99%   BMI 59.76 kg/m   Wt Readings from Last 3 Encounters:  07/14/18 (!) 393 lb (178.3 kg)  07/04/18 (!) 393 lb (178.3 kg)  06/14/18 (!) 380 lb (172.4 kg)    Physical Exam Vitals signs and nursing note reviewed.  Constitutional:      General: He is not in acute distress.    Appearance: Normal appearance. He is not ill-appearing, toxic-appearing or diaphoretic.  HENT:     Head: Normocephalic and  atraumatic.     Right Ear: External ear normal.     Left Ear: External ear normal.     Nose: Nose normal.     Mouth/Throat:     Mouth: Mucous membranes are moist.     Pharynx: Oropharynx is clear.  Eyes:     General: No scleral icterus.       Right eye: No discharge.        Left eye: No discharge.     Extraocular Movements: Extraocular movements intact.     Conjunctiva/sclera: Conjunctivae normal.     Pupils: Pupils are equal, round, and reactive to light.  Neck:     Musculoskeletal: Normal range of motion and neck supple.  Cardiovascular:     Rate and Rhythm: Normal rate and regular rhythm.     Pulses: Normal pulses.     Heart sounds: Normal heart sounds. No murmur. No friction rub. No gallop.   Pulmonary:     Effort: Pulmonary effort is normal. No respiratory distress.     Breath sounds: Normal breath sounds. No stridor. No wheezing, rhonchi or rales.  Chest:     Chest wall: No tenderness.  Musculoskeletal: Normal range of motion.  General: Swelling (1+ edema bilaterally- improved from prior) present.  Skin:    General: Skin is warm and dry.     Capillary Refill: Capillary refill takes less than 2 seconds.     Coloration: Skin is not jaundiced or pale.     Findings: No bruising, erythema, lesion or rash.  Neurological:     General: No focal deficit present.     Mental Status: He is alert and oriented to person, place, and time. Mental status is at baseline.  Psychiatric:        Mood and Affect: Mood normal.        Behavior: Behavior normal.        Thought Content: Thought content normal.        Judgment: Judgment normal.     Results for orders placed or performed in visit on 07/04/18  TSH  Result Value Ref Range   TSH 9.480 (H) 0.450 - 4.500 uIU/mL  CBC with Differential/Platelet  Result Value Ref Range   WBC 6.1 3.4 - 10.8 x10E3/uL   RBC 4.46 4.14 - 5.80 x10E6/uL   Hemoglobin 11.1 (L) 13.0 - 17.7 g/dL   Hematocrit 81.1 (L) 88.6 - 51.0 %   MCV 83 79 - 97  fL   MCH 24.9 (L) 26.6 - 33.0 pg   MCHC 29.9 (L) 31.5 - 35.7 g/dL   RDW 77.3 (H) 73.6 - 68.1 %   Platelets 267 150 - 450 x10E3/uL   Neutrophils 59 Not Estab. %   Lymphs 19 Not Estab. %   Monocytes 14 Not Estab. %   Eos 7 Not Estab. %   Basos 1 Not Estab. %   Neutrophils Absolute 3.7 1.4 - 7.0 x10E3/uL   Lymphocytes Absolute 1.2 0.7 - 3.1 x10E3/uL   Monocytes Absolute 0.8 0.1 - 0.9 x10E3/uL   EOS (ABSOLUTE) 0.4 0.0 - 0.4 x10E3/uL   Basophils Absolute 0.1 0.0 - 0.2 x10E3/uL   Immature Granulocytes 0 Not Estab. %   Immature Grans (Abs) 0.0 0.0 - 0.1 x10E3/uL  Uric acid  Result Value Ref Range   Uric Acid 8.6 3.7 - 8.6 mg/dL  Iron and TIBC  Result Value Ref Range   Total Iron Binding Capacity 467 (H) 250 - 450 ug/dL   UIBC 594 (H) 707 - 615 ug/dL   Iron 27 (L) 38 - 183 ug/dL   Iron Saturation 6 (LL) 15 - 55 %  Ferritin  Result Value Ref Range   Ferritin 11 (L) 30 - 400 ng/mL  B12  Result Value Ref Range   Vitamin B-12 318 232 - 1,245 pg/mL  Lipid Panel w/o Chol/HDL Ratio  Result Value Ref Range   Cholesterol, Total 156 100 - 199 mg/dL   Triglycerides 437 0 - 149 mg/dL   HDL 63 >35 mg/dL   VLDL Cholesterol Cal 23 5 - 40 mg/dL   LDL Calculated 70 0 - 99 mg/dL  Comprehensive metabolic panel  Result Value Ref Range   Glucose 113 (H) 65 - 99 mg/dL   BUN 19 6 - 24 mg/dL   Creatinine, Ser 7.89 (H) 0.76 - 1.27 mg/dL   GFR calc non Af Amer 61 >59 mL/min/1.73   GFR calc Af Amer 70 >59 mL/min/1.73   BUN/Creatinine Ratio 15 9 - 20   Sodium 138 134 - 144 mmol/L   Potassium 4.8 3.5 - 5.2 mmol/L   Chloride 99 96 - 106 mmol/L   CO2 24 20 - 29 mmol/L   Calcium 9.8 8.7 -  10.2 mg/dL   Total Protein 7.3 6.0 - 8.5 g/dL   Albumin 4.2 3.8 - 4.9 g/dL   Globulin, Total 3.1 1.5 - 4.5 g/dL   Albumin/Globulin Ratio 1.4 1.2 - 2.2   Bilirubin Total 0.3 0.0 - 1.2 mg/dL   Alkaline Phosphatase 91 39 - 117 IU/L   AST 11 0 - 40 IU/L   ALT 9 0 - 44 IU/L      Assessment & Plan:   Problem List  Items Addressed This Visit      Cardiovascular and Mediastinum   Chronic systolic heart failure (HCC) - Primary    Lungs clear and legs less swollen. Continue torsemide and spironalactone. Call with any concerns. Follow up with cardiology as scheduled on Tuesday. Call with any concerns.         Respiratory   COPD (chronic obstructive pulmonary disease) (HCC) (Chronic)    Lungs clear. SOB likely multi-factorial and in part due to deconditioning. Would likely benefit from PT/cardiac rehab after COVID Pandemic. Continue symbicort. Call with any concerns.           Follow up plan: Return in about 4 weeks (around 08/11/2018) for follow up thyroid, anemia.

## 2018-07-14 NOTE — Progress Notes (Deleted)
Cardiology Office Note Date:  07/14/2018  Patient ID:  [name] [name], DOB [birth year], MRN [id number] PCP:  Dorcas Carrow, DO  Cardiologist:  Dr. Okey Dupre, MD  ***refresh   Chief Complaint: Follow up  History of Present Illness: [name] [name] is a 58 y.o. male with history of HFrEF secondary to presumed NICM, persistent Afib on amiodarone and Xarelto, COPD, morbid obesity, and sleep apnea on CPAP who presents for follow up of his Afib and CHF.   He was originally admitted in 03/2018 with acute on chronic HFrEF in the setting of Afib. Following diuresis, he underwent DCCV, though failed to follow up after discharge. Echo at that time showed an EF of 25-30%, mildly dilated LV, diffuse HK, moderately dilated LA. He was readmitted in 05/2018 with chest pain and SOB in the setting of recurrent Afib with concern for viral illness. He tested negative for COVID-19. He was rate controlled and started on amiodarone load. Repeat DCCV was deferred given adequate rate control and need for amiodarone loading. He was seen in telemedicine follow up on 06/08/2018 noting subjective fever and chronic arthralgias with chronic BC Powder use. He denied any exertional chest pain, though did notice exertional palpitations and dyspnea. His leg edema had improved and his weight was down to 380 pounds. He also reported occasional BRBPR and had previously noted this in the past leading to a colonoscopy that found hemorrhoids. Bleeding was now occurring even without a BM. It was noted he was taking torsemide and spironolactone on alternating days rather than daily. Given he was tolerating this, no changes were made.    *** Labs ekg dccv Weight bleeding    Past Medical History:  Diagnosis Date  . Asthma   . Brain damage   . COPD (chronic obstructive pulmonary disease) (HCC)   . HFrEF (heart failure with reduced ejection fraction) (HCC)    a. 03/2018 Echo: EF 25-30%, diff HK. Mod dil LA.  [name] Persistent atrial  fibrillation    a. 03/2018 s/p DCCV; b. CHA2DS2VASc = 1-->Xarelto; c. 05/2018 recurrent afib-->Amio initiated;   . Sleep apnea     Past Surgical History:  Procedure Laterality Date  . CARDIOVERSION N/A 03/24/2018   Procedure: CARDIOVERSION;  Surgeon: Antonieta Iba, MD;  Location: [facility] ORS;  Service: Cardiovascular;  Laterality: N/A;  . hearnia repair     X 3- total of two surgeries  . LEG SURGERY      No outpatient medications have been marked as taking for the 07/18/18 encounter (Appointment) with Sondra Barges, PA-C.    Allergies:   Patient has no known allergies.   Social History:  The patient  reports that he has never smoked. He has never used smokeless tobacco. He reports that he does not drink alcohol or use drugs.   Family History:  The patient's family history includes Heart attack in his maternal grandfather and maternal grandmother; Heart failure in his mother; Lung cancer in his father and mother.  ROS:   ROS   PHYSICAL EXAM: *** VS:  There were no vitals taken for this visit. BMI: There is no height or weight on file to calculate BMI.  Physical Exam   EKG:  Was ordered and interpreted by me today. Shows ***  Recent Labs: 05/29/2018: B Natriuretic Peptide 289.0; Magnesium 1.7 07/04/2018: ALT 9; BUN 19; Creatinine, Ser 1.30; Hemoglobin 11.1; Platelets 267; Potassium 4.8; Sodium 138; TSH 9.480  01/28/2018: Total CHOL/HDL Ratio 2.3; VLDL 10 07/04/2018: Cholesterol, Total 156; HDL 63;  LDL Calculated 70; Triglycerides 114   Estimated Creatinine Clearance: 99.7 mL/min (A) (by C-G formula based on SCr of 1.3 mg/dL (H)).   Wt Readings from Last 3 Encounters:  07/14/18 (!) 393 lb (178.3 kg)  07/04/18 (!) 393 lb (178.3 kg)  06/14/18 (!) 380 lb (172.4 kg)     Other studies reviewed: Additional studies/records reviewed today include: summarized above  ASSESSMENT AND PLAN:  1. ***  Disposition: F/u with Dr. Okey Dupre or an APP in ***.  Current medicines are reviewed at  length with the patient today.  The patient did not have any concerns regarding medicines.  Signed, [name], PA-C 07/14/2018 4:40 PM     [facility] -  [address] [place], [place] [address] [phone]

## 2018-07-17 ENCOUNTER — Encounter: Payer: Self-pay | Admitting: Emergency Medicine

## 2018-07-17 ENCOUNTER — Other Ambulatory Visit: Payer: Self-pay

## 2018-07-17 ENCOUNTER — Emergency Department: Payer: Medicaid Other

## 2018-07-17 DIAGNOSIS — J441 Chronic obstructive pulmonary disease with (acute) exacerbation: Secondary | ICD-10-CM | POA: Diagnosis present

## 2018-07-17 DIAGNOSIS — I10 Essential (primary) hypertension: Secondary | ICD-10-CM | POA: Diagnosis not present

## 2018-07-17 DIAGNOSIS — E875 Hyperkalemia: Secondary | ICD-10-CM | POA: Diagnosis present

## 2018-07-17 DIAGNOSIS — Z6841 Body Mass Index (BMI) 40.0 and over, adult: Secondary | ICD-10-CM

## 2018-07-17 DIAGNOSIS — I959 Hypotension, unspecified: Secondary | ICD-10-CM | POA: Diagnosis not present

## 2018-07-17 DIAGNOSIS — D649 Anemia, unspecified: Secondary | ICD-10-CM | POA: Diagnosis present

## 2018-07-17 DIAGNOSIS — G4733 Obstructive sleep apnea (adult) (pediatric): Secondary | ICD-10-CM | POA: Diagnosis present

## 2018-07-17 DIAGNOSIS — Z86711 Personal history of pulmonary embolism: Secondary | ICD-10-CM

## 2018-07-17 DIAGNOSIS — Z7951 Long term (current) use of inhaled steroids: Secondary | ICD-10-CM

## 2018-07-17 DIAGNOSIS — E039 Hypothyroidism, unspecified: Secondary | ICD-10-CM | POA: Diagnosis present

## 2018-07-17 DIAGNOSIS — I509 Heart failure, unspecified: Secondary | ICD-10-CM | POA: Diagnosis not present

## 2018-07-17 DIAGNOSIS — I248 Other forms of acute ischemic heart disease: Secondary | ICD-10-CM | POA: Diagnosis present

## 2018-07-17 DIAGNOSIS — I110 Hypertensive heart disease with heart failure: Secondary | ICD-10-CM | POA: Diagnosis not present

## 2018-07-17 DIAGNOSIS — I4819 Other persistent atrial fibrillation: Secondary | ICD-10-CM | POA: Diagnosis present

## 2018-07-17 DIAGNOSIS — Z9111 Patient's noncompliance with dietary regimen: Secondary | ICD-10-CM

## 2018-07-17 DIAGNOSIS — E119 Type 2 diabetes mellitus without complications: Secondary | ICD-10-CM | POA: Diagnosis present

## 2018-07-17 DIAGNOSIS — Z79899 Other long term (current) drug therapy: Secondary | ICD-10-CM

## 2018-07-17 DIAGNOSIS — I5023 Acute on chronic systolic (congestive) heart failure: Secondary | ICD-10-CM | POA: Diagnosis present

## 2018-07-17 DIAGNOSIS — J9601 Acute respiratory failure with hypoxia: Secondary | ICD-10-CM | POA: Diagnosis present

## 2018-07-17 DIAGNOSIS — Z1159 Encounter for screening for other viral diseases: Secondary | ICD-10-CM

## 2018-07-17 DIAGNOSIS — Z79890 Hormone replacement therapy (postmenopausal): Secondary | ICD-10-CM

## 2018-07-17 DIAGNOSIS — G894 Chronic pain syndrome: Secondary | ICD-10-CM | POA: Diagnosis present

## 2018-07-17 DIAGNOSIS — R079 Chest pain, unspecified: Secondary | ICD-10-CM | POA: Diagnosis not present

## 2018-07-17 DIAGNOSIS — Z9114 Patient's other noncompliance with medication regimen: Secondary | ICD-10-CM

## 2018-07-17 DIAGNOSIS — Z7901 Long term (current) use of anticoagulants: Secondary | ICD-10-CM

## 2018-07-17 DIAGNOSIS — R110 Nausea: Secondary | ICD-10-CM | POA: Diagnosis not present

## 2018-07-17 DIAGNOSIS — I428 Other cardiomyopathies: Secondary | ICD-10-CM | POA: Diagnosis present

## 2018-07-17 DIAGNOSIS — M5430 Sciatica, unspecified side: Secondary | ICD-10-CM | POA: Diagnosis present

## 2018-07-17 LAB — CBC WITH DIFFERENTIAL/PLATELET
Abs Immature Granulocytes: 0.02 10*3/uL (ref 0.00–0.07)
Basophils Absolute: 0.1 10*3/uL (ref 0.0–0.1)
Basophils Relative: 1 %
Eosinophils Absolute: 0.1 10*3/uL (ref 0.0–0.5)
Eosinophils Relative: 1 %
HCT: 32.6 % — ABNORMAL LOW (ref 39.0–52.0)
Hemoglobin: 10 g/dL — ABNORMAL LOW (ref 13.0–17.0)
Immature Granulocytes: 0 %
Lymphocytes Relative: 19 %
Lymphs Abs: 1.1 10*3/uL (ref 0.7–4.0)
MCH: 24.8 pg — ABNORMAL LOW (ref 26.0–34.0)
MCHC: 30.7 g/dL (ref 30.0–36.0)
MCV: 80.9 fL (ref 80.0–100.0)
Monocytes Absolute: 0.7 10*3/uL (ref 0.1–1.0)
Monocytes Relative: 12 %
Neutro Abs: 4 10*3/uL (ref 1.7–7.7)
Neutrophils Relative %: 67 %
Platelets: 266 10*3/uL (ref 150–400)
RBC: 4.03 MIL/uL — ABNORMAL LOW (ref 4.22–5.81)
RDW: 19.9 % — ABNORMAL HIGH (ref 11.5–15.5)
WBC: 5.9 10*3/uL (ref 4.0–10.5)
nRBC: 0 % (ref 0.0–0.2)

## 2018-07-17 NOTE — ED Triage Notes (Signed)
Pt to triage via w/c with no distress noted, brought in by EMS for left sided CP, nonradiating x 3 hrs, increases with deep breathing accomp by nausea; hx afib, MI

## 2018-07-18 ENCOUNTER — Emergency Department: Payer: Medicaid Other

## 2018-07-18 ENCOUNTER — Other Ambulatory Visit: Payer: Self-pay

## 2018-07-18 ENCOUNTER — Inpatient Hospital Stay
Admission: EM | Admit: 2018-07-18 | Discharge: 2018-07-23 | DRG: 291 | Disposition: A | Discharge status: Home Health | Payer: Medicaid Other | Attending: Internal Medicine | Admitting: Internal Medicine

## 2018-07-18 ENCOUNTER — Ambulatory Visit: Payer: Medicaid Other | Admitting: Physician Assistant

## 2018-07-18 DIAGNOSIS — I5023 Acute on chronic systolic (congestive) heart failure: Secondary | ICD-10-CM | POA: Diagnosis not present

## 2018-07-18 DIAGNOSIS — Z6841 Body Mass Index (BMI) 40.0 and over, adult: Secondary | ICD-10-CM | POA: Diagnosis not present

## 2018-07-18 DIAGNOSIS — Z7951 Long term (current) use of inhaled steroids: Secondary | ICD-10-CM | POA: Diagnosis not present

## 2018-07-18 DIAGNOSIS — Z79899 Other long term (current) drug therapy: Secondary | ICD-10-CM | POA: Diagnosis not present

## 2018-07-18 DIAGNOSIS — Z9111 Patient's noncompliance with dietary regimen: Secondary | ICD-10-CM | POA: Diagnosis not present

## 2018-07-18 DIAGNOSIS — I4819 Other persistent atrial fibrillation: Secondary | ICD-10-CM | POA: Diagnosis not present

## 2018-07-18 DIAGNOSIS — I10 Essential (primary) hypertension: Secondary | ICD-10-CM | POA: Diagnosis not present

## 2018-07-18 DIAGNOSIS — I4891 Unspecified atrial fibrillation: Secondary | ICD-10-CM

## 2018-07-18 DIAGNOSIS — I110 Hypertensive heart disease with heart failure: Secondary | ICD-10-CM | POA: Diagnosis not present

## 2018-07-18 DIAGNOSIS — I482 Chronic atrial fibrillation, unspecified: Secondary | ICD-10-CM | POA: Diagnosis not present

## 2018-07-18 DIAGNOSIS — M5430 Sciatica, unspecified side: Secondary | ICD-10-CM | POA: Diagnosis present

## 2018-07-18 DIAGNOSIS — Z79890 Hormone replacement therapy (postmenopausal): Secondary | ICD-10-CM | POA: Diagnosis not present

## 2018-07-18 DIAGNOSIS — E875 Hyperkalemia: Secondary | ICD-10-CM | POA: Diagnosis present

## 2018-07-18 DIAGNOSIS — I248 Other forms of acute ischemic heart disease: Secondary | ICD-10-CM | POA: Diagnosis present

## 2018-07-18 DIAGNOSIS — J441 Chronic obstructive pulmonary disease with (acute) exacerbation: Secondary | ICD-10-CM

## 2018-07-18 DIAGNOSIS — I509 Heart failure, unspecified: Secondary | ICD-10-CM

## 2018-07-18 DIAGNOSIS — E119 Type 2 diabetes mellitus without complications: Secondary | ICD-10-CM | POA: Diagnosis present

## 2018-07-18 DIAGNOSIS — G4733 Obstructive sleep apnea (adult) (pediatric): Secondary | ICD-10-CM | POA: Diagnosis present

## 2018-07-18 DIAGNOSIS — Z7901 Long term (current) use of anticoagulants: Secondary | ICD-10-CM | POA: Diagnosis not present

## 2018-07-18 DIAGNOSIS — J9601 Acute respiratory failure with hypoxia: Secondary | ICD-10-CM | POA: Diagnosis not present

## 2018-07-18 DIAGNOSIS — J449 Chronic obstructive pulmonary disease, unspecified: Secondary | ICD-10-CM | POA: Diagnosis not present

## 2018-07-18 DIAGNOSIS — Z136 Encounter for screening for cardiovascular disorders: Secondary | ICD-10-CM | POA: Diagnosis not present

## 2018-07-18 DIAGNOSIS — R079 Chest pain, unspecified: Secondary | ICD-10-CM | POA: Diagnosis not present

## 2018-07-18 DIAGNOSIS — Z86711 Personal history of pulmonary embolism: Secondary | ICD-10-CM | POA: Diagnosis not present

## 2018-07-18 DIAGNOSIS — I428 Other cardiomyopathies: Secondary | ICD-10-CM | POA: Diagnosis present

## 2018-07-18 DIAGNOSIS — D649 Anemia, unspecified: Secondary | ICD-10-CM | POA: Diagnosis present

## 2018-07-18 DIAGNOSIS — G894 Chronic pain syndrome: Secondary | ICD-10-CM | POA: Diagnosis present

## 2018-07-18 DIAGNOSIS — Z9114 Patient's other noncompliance with medication regimen: Secondary | ICD-10-CM | POA: Diagnosis not present

## 2018-07-18 DIAGNOSIS — Z1159 Encounter for screening for other viral diseases: Secondary | ICD-10-CM | POA: Diagnosis not present

## 2018-07-18 DIAGNOSIS — E039 Hypothyroidism, unspecified: Secondary | ICD-10-CM | POA: Diagnosis present

## 2018-07-18 LAB — SARS CORONAVIRUS 2 BY RT PCR (HOSPITAL ORDER, PERFORMED IN ~~LOC~~ HOSPITAL LAB): SARS Coronavirus 2: NEGATIVE

## 2018-07-18 LAB — COMPREHENSIVE METABOLIC PANEL
ALT: 10 U/L (ref 0–44)
AST: 20 U/L (ref 15–41)
Albumin: 3.5 g/dL (ref 3.5–5.0)
Alkaline Phosphatase: 78 U/L (ref 38–126)
Anion gap: 9 (ref 5–15)
BUN: 15 mg/dL (ref 6–20)
CO2: 23 mmol/L (ref 22–32)
Calcium: 8.2 mg/dL — ABNORMAL LOW (ref 8.9–10.3)
Chloride: 105 mmol/L (ref 98–111)
Creatinine, Ser: 0.95 mg/dL (ref 0.61–1.24)
GFR calc Af Amer: >60 mL/min (ref >60)
GFR calc non Af Amer: >60 mL/min (ref >60)
Glucose, Bld: 119 mg/dL — ABNORMAL HIGH (ref 70–99)
Potassium: 4 mmol/L (ref 3.5–5.1)
Sodium: 137 mmol/L (ref 135–145)
Total Bilirubin: 0.7 mg/dL (ref 0.3–1.2)
Total Protein: 7 g/dL (ref 6.5–8.1)

## 2018-07-18 LAB — GLUCOSE, CAPILLARY
Glucose-Capillary: 183 mg/dL — ABNORMAL HIGH (ref 70–99)
Glucose-Capillary: 135 mg/dL — ABNORMAL HIGH (ref 70–99)

## 2018-07-18 LAB — BRAIN NATRIURETIC PEPTIDE: B Natriuretic Peptide: 290 pg/mL — ABNORMAL HIGH (ref 0.0–100.0)

## 2018-07-18 LAB — TSH: TSH: 4.518 u[IU]/mL — ABNORMAL HIGH (ref 0.350–4.500)

## 2018-07-18 LAB — TROPONIN I
Troponin I: 0.03 ng/mL (ref <0.03)
Troponin I: 0.03 ng/mL (ref <0.03)
Troponin I: 0.04 ng/mL (ref <0.03)

## 2018-07-18 MED ORDER — MOMETASONE FURO-FORMOTEROL FUM 200-5 MCG/ACT IN AERO
2.0000 | INHALATION_SPRAY | Freq: Two times a day (BID) | RESPIRATORY_TRACT | Status: DC
  Administered 2018-07-18 – 2018-07-23 (×11): 2 via RESPIRATORY_TRACT
  Filled 2018-07-18 (×2): qty 8.8

## 2018-07-18 MED ORDER — FUROSEMIDE 10 MG/ML IJ SOLN
40.0000 mg | Freq: Once | INTRAMUSCULAR | Status: AC
  Administered 2018-07-18: 40 mg via INTRAVENOUS
  Filled 2018-07-18: qty 4

## 2018-07-18 MED ORDER — NITROGLYCERIN 0.4 MG SL SUBL
0.4000 mg | SUBLINGUAL_TABLET | SUBLINGUAL | Status: DC | PRN
  Administered 2018-07-18 (×3): 0.4 mg via SUBLINGUAL
  Filled 2018-07-18: qty 1

## 2018-07-18 MED ORDER — NITROGLYCERIN 0.4 MG SL SUBL
SUBLINGUAL_TABLET | SUBLINGUAL | Status: AC
  Filled 2018-07-18: qty 1

## 2018-07-18 MED ORDER — SODIUM CHLORIDE 0.9% FLUSH
3.0000 mL | Freq: Two times a day (BID) | INTRAVENOUS | Status: DC
  Administered 2018-07-18 – 2018-07-23 (×9): 3 mL via INTRAVENOUS

## 2018-07-18 MED ORDER — INSULIN ASPART 100 UNIT/ML ~~LOC~~ SOLN: 0.0000 [IU] | Freq: Every day | SUBCUTANEOUS | Status: DC

## 2018-07-18 MED ORDER — INSULIN ASPART 100 UNIT/ML ~~LOC~~ SOLN: 0.0000 [IU] | Freq: Three times a day (TID) | SUBCUTANEOUS | Status: DC

## 2018-07-18 MED ORDER — ACETAMINOPHEN 325 MG PO TABS
650.0000 mg | ORAL_TABLET | Freq: Four times a day (QID) | ORAL | Status: DC | PRN
  Administered 2018-07-18: 650 mg via ORAL
  Filled 2018-07-18: qty 2

## 2018-07-18 MED ORDER — AMIODARONE HCL 200 MG PO TABS
200.0000 mg | ORAL_TABLET | Freq: Every day | ORAL | Status: DC
  Administered 2018-07-19 – 2018-07-21 (×3): 200 mg via ORAL
  Filled 2018-07-18 (×3): qty 1

## 2018-07-18 MED ORDER — DOCUSATE SODIUM 100 MG PO CAPS
100.0000 mg | ORAL_CAPSULE | Freq: Two times a day (BID) | ORAL | Status: DC
  Administered 2018-07-20 – 2018-07-23 (×6): 100 mg via ORAL
  Filled 2018-07-18 (×9): qty 1

## 2018-07-18 MED ORDER — IPRATROPIUM-ALBUTEROL 0.5-2.5 (3) MG/3ML IN SOLN
3.0000 mL | Freq: Once | RESPIRATORY_TRACT | Status: AC
  Administered 2018-07-18: 3 mL via RESPIRATORY_TRACT
  Filled 2018-07-18: qty 3

## 2018-07-18 MED ORDER — SODIUM CHLORIDE 0.9% FLUSH
3.0000 mL | INTRAVENOUS | Status: DC | PRN
  Administered 2018-07-20: 3 mL via INTRAVENOUS
  Filled 2018-07-18: qty 3

## 2018-07-18 MED ORDER — ALBUTEROL SULFATE (2.5 MG/3ML) 0.083% IN NEBU: 2.5000 mg | INHALATION_SOLUTION | RESPIRATORY_TRACT | Status: DC | PRN

## 2018-07-18 MED ORDER — FUROSEMIDE 10 MG/ML IJ SOLN
40.0000 mg | Freq: Two times a day (BID) | INTRAMUSCULAR | Status: DC
  Administered 2018-07-19 – 2018-07-20 (×4): 40 mg via INTRAVENOUS
  Filled 2018-07-18 (×4): qty 4

## 2018-07-18 MED ORDER — ONDANSETRON HCL 4 MG/2ML IJ SOLN: 4.0000 mg | Freq: Four times a day (QID) | INTRAMUSCULAR | Status: DC | PRN

## 2018-07-18 MED ORDER — INSULIN ASPART 100 UNIT/ML ~~LOC~~ SOLN
0.0000 [IU] | Freq: Three times a day (TID) | SUBCUTANEOUS | Status: DC
  Administered 2018-07-19 – 2018-07-20 (×2): 2 [IU] via SUBCUTANEOUS
  Filled 2018-07-18 (×3): qty 1

## 2018-07-18 MED ORDER — ONDANSETRON HCL 4 MG PO TABS: 4.0000 mg | ORAL_TABLET | Freq: Four times a day (QID) | ORAL | Status: DC | PRN

## 2018-07-18 MED ORDER — ACETAMINOPHEN 650 MG RE SUPP: 650.0000 mg | Freq: Four times a day (QID) | RECTAL | Status: DC | PRN

## 2018-07-18 MED ORDER — METHYLPREDNISOLONE SODIUM SUCC 125 MG IJ SOLR
125.0000 mg | Freq: Once | INTRAMUSCULAR | Status: AC
  Administered 2018-07-18: 125 mg via INTRAVENOUS
  Filled 2018-07-18: qty 2

## 2018-07-18 MED ORDER — MORPHINE SULFATE (PF) 2 MG/ML IV SOLN
2.0000 mg | INTRAVENOUS | Status: DC | PRN
  Administered 2018-07-18 – 2018-07-23 (×5): 2 mg via INTRAVENOUS
  Filled 2018-07-18 (×6): qty 1

## 2018-07-18 MED ORDER — FENTANYL CITRATE (PF) 100 MCG/2ML IJ SOLN
100.0000 ug | Freq: Once | INTRAMUSCULAR | Status: AC
  Administered 2018-07-18: 100 ug via INTRAVENOUS
  Filled 2018-07-18: qty 2

## 2018-07-18 MED ORDER — RIVAROXABAN 20 MG PO TABS
20.0000 mg | ORAL_TABLET | Freq: Every day | ORAL | Status: DC
  Administered 2018-07-18 – 2018-07-22 (×5): 20 mg via ORAL
  Filled 2018-07-18 (×7): qty 1

## 2018-07-18 MED ORDER — ONDANSETRON HCL 4 MG/2ML IJ SOLN
4.0000 mg | Freq: Once | INTRAMUSCULAR | Status: AC
  Administered 2018-07-18: 4 mg via INTRAVENOUS
  Filled 2018-07-18: qty 2

## 2018-07-18 NOTE — ED Notes (Signed)
Pt resting in bed, a&ox4, pt using cpap machine but states his baseline is to only use this at night. Asked pt if he would like a condom catheter but he refuses.

## 2018-07-18 NOTE — ED Notes (Signed)
Cardiology consult at bedside.

## 2018-07-18 NOTE — ED Notes (Signed)
RT to bring CPAP

## 2018-07-18 NOTE — ED Notes (Signed)
Pt pulled IV out. Wiped pt's room down,placed new leads,new bedding,and RN Shanda Bumps is placing new IV.

## 2018-07-18 NOTE — ED Notes (Signed)
Pt given lunch tray.

## 2018-07-18 NOTE — Progress Notes (Signed)
[employer] [facility] [place] [facility]   PATIENT NAME: [name] [name]    MR#:  [id number]  DATE OF BIRTH:  [birth year]  SUBJECTIVE:  CHIEF COMPLAINT:   Chief Complaint  Patient presents with  . Chest Pain  Patient seen and evaluated today Shortness of breath present Swelling in the legs No chest pain  REVIEW OF SYSTEMS:    ROS  CONSTITUTIONAL: No documented fever. No fatigue, weakness. No weight gain, no weight loss.  EYES: No blurry or double vision.  ENT: No tinnitus. No postnasal drip. No redness of the oropharynx.  RESPIRATORY: No cough, no wheeze, no hemoptysis. Has dyspnea.  CARDIOVASCULAR: No chest pain. No orthopnea. No palpitations. No syncope.  GASTROINTESTINAL: No nausea, no vomiting or diarrhea. No abdominal pain. No melena or hematochezia.  GENITOURINARY: No dysuria or hematuria.  ENDOCRINE: No polyuria or nocturia. No heat or cold intolerance.  HEMATOLOGY: No anemia. No bruising. No bleeding.  INTEGUMENTARY: No rashes. No lesions.  MUSCULOSKELETAL: No arthritis. Has swelling. No gout.  NEUROLOGIC: No numbness, tingling, or ataxia. No seizure-type activity.  PSYCHIATRIC: No anxiety. No insomnia. No ADD.   DRUG ALLERGIES:  No Known Allergies  VITALS:  Blood pressure (!) 162/87, pulse (!) 47, temperature 98.3 F (36.8 C), temperature source Oral, resp. rate (!) 21, height 5\' 8"  (1.727 m), weight (!) 178.3 kg, SpO2 94 %.  PHYSICAL EXAMINATION:   Physical Exam  GENERAL:  58 y.o.-year-old patient lying in the bed with no acute distress.  EYES: Pupils equal, round, reactive to light and accommodation. No scleral icterus. Extraocular muscles intact.  HEENT: Head atraumatic, normocephalic. Oropharynx and nasopharynx clear.  NECK:  Supple, no jugular venous distention. No thyroid enlargement, no tenderness.  LUNGS: Decreased breath sounds bilaterally, bibasilar crepitations heard. CARDIOVASCULAR: S1, S2 normal. No murmurs, rubs, or gallops.   ABDOMEN: Soft, nontender, nondistended. Bowel sounds present. No organomegaly or mass.  EXTREMITIES: No cyanosis, No clubbing bilateral edema b/l.    NEUROLOGIC: Cranial nerves II through XII are intact. No focal Motor or sensory deficits b/l.   PSYCHIATRIC: The patient is alert and oriented x 3.  SKIN: No obvious rash, lesion, or ulcer.   LABORATORY PANEL:   CBC Recent Labs  Lab 07/17/18 2333  WBC 5.9  HGB 10.0*  HCT 32.6*  PLT 266   ------------------------------------------------------------------------------------------------------------------ Chemistries  Recent Labs  Lab 07/17/18 2333  NA 137  K 4.0  CL 105  CO2 23  GLUCOSE 119*  BUN 15  CREATININE 0.95  CALCIUM 8.2*  AST 20  ALT 10  ALKPHOS 78  BILITOT 0.7   ------------------------------------------------------------------------------------------------------------------  Cardiac Enzymes Recent Labs  Lab 07/18/18 0614  TROPONINI 0.03*   ------------------------------------------------------------------------------------------------------------------  RADIOLOGY:  Dg Chest Port 1 View  Result Date: 07/18/2018 CLINICAL DATA:  Left-sided chest pain EXAM: PORTABLE CHEST 1 VIEW COMPARISON:  05/29/2018 FINDINGS: The cardiac silhouette is enlarged. There is mild volume overload. No pneumothorax. No large pleural effusion. No acute osseous abnormality. IMPRESSION: Cardiomegaly with mild volume overload. Electronically Signed   By: Katherine Mantle M.D.   On: 07/18/2018 01:41     ASSESSMENT AND PLAN:   58 year old male patient always with history of congestive heart failure, atrial fibrillation, sleep apnea, COPD currently under hospitalist service for shortness of breath  -Acute on chronic systolic heart failure exacerbation EF of 25 to 30% from echo in February 2020 Continue diuresis with Lasix and Aldactone Monitor renal function Cardiology consult follow-up Monitor input output chart and daily body  weights  -  Chronic atrial fibrillation Rate controlled with metoprolol and amiodarone Continue Xarelto for anticoagulation  -Hypothyroidism Continue Synthroid  -Hypertension Continue ACE inhibitor  -Obesity Healthy diet and exercise consult  -DVT prophylaxis On anticoagulation with Xarelto  All the records are reviewed and case discussed with Care Management/Social Worker. Management plans discussed with the patient, family and they are in agreement.  CODE STATUS: Full code  DVT Prophylaxis: SCDs  TOTAL TIME TAKING CARE OF THIS PATIENT: 45 minutes.   POSSIBLE D/C IN 2 to 3  DAYS, DEPENDING ON CLINICAL CONDITION.  Ihor [name] M.D on 07/18/2018 at 12:37 PM  Between 7am to 6pm - Pager - [phone]  After 6pm go to [email or web address] - password [email or web address] [facility]  [facility]  Office  [phone]  CC: Primary care physician; Dorcas Carrow, DO  Note: This dictation was prepared with Dragon dictation along with smaller phrase technology. Any transcriptional errors that result from this process are unintentional.

## 2018-07-18 NOTE — ED Notes (Signed)
Pt keeps turning and pulling monitor off.  Unable to keep monitor equipment on pt.

## 2018-07-18 NOTE — ED Notes (Signed)
Pt given regular hospital bed

## 2018-07-18 NOTE — ED Notes (Signed)
ED TO INPATIENT HANDOFF REPORT  ED Nurse Name and Phone #: Shanda Bumps [phone]  S Name/Age/Gender [name] [name] 58 y.o. male Room/Bed: ED10A/ED10A  Code Status   Code Status: Full Code  Home/SNF/Other Home Patient oriented to: self, place, time and situation Is this baseline? Yes   Triage Complete: Triage complete  Chief Complaint chest pain  Triage Note Pt to triage via w/c with no distress noted, brought in by EMS for left sided CP, nonradiating x 3 hrs, increases with deep breathing accomp by nausea; hx afib, MI   Allergies No Known Allergies  Level of Care/Admitting Diagnosis ED Disposition    ED Disposition Condition Comment   Admit  Hospital Area: [facility] [[id number]]  Level of Care: Med-Surg [16]  Covid Evaluation: Screening Protocol (No Symptoms)  Diagnosis: Acute on chronic systolic CHF (congestive heart failure) [facility]) [[id number]]  Admitting Physician: Arnaldo Natal [[id number]]  Attending Physician: Arnaldo Natal [[id number]]  Estimated length of stay: past midnight tomorrow  Certification:: I certify this patient will need inpatient services for at least 2 midnights  PT Class (Do Not Modify): Inpatient [101]  PT Acc Code (Do Not Modify): Private [1]       B Medical/Surgery History Past Medical History:  Diagnosis Date  . Asthma   . Brain damage   . COPD (chronic obstructive pulmonary disease) (HCC)   . HFrEF (heart failure with reduced ejection fraction) (HCC)    a. 03/2018 Echo: EF 25-30%, diff HK. Mod dil LA.  [name] Persistent atrial fibrillation    a. 03/2018 s/p DCCV; b. CHA2DS2VASc = 1-->Xarelto; c. 05/2018 recurrent afib-->Amio initiated;   . Sleep apnea    Past Surgical History:  Procedure Laterality Date  . CARDIOVERSION N/A 03/24/2018   Procedure: CARDIOVERSION;  Surgeon: Antonieta Iba, MD;  Location: [facility] ORS;  Service: Cardiovascular;  Laterality: N/A;  . hearnia repair     X 3- total of two surgeries  . LEG SURGERY        A IV Location/Drains/Wounds Patient Lines/Drains/Airways Status   Active Line/Drains/Airways    Name:   Placement date:   Placement time:   Site:   Days:   Peripheral IV 07/18/18 Left Hand   07/18/18    1127    Hand   less than 1   Wound / Incision (Open or Dehisced) 03/17/18 Venous stasis ulcer Ankle Left;Lateral Pink shallow wound bed   03/17/18    0129    Ankle   123          Intake/Output Last 24 hours  Intake/Output Summary (Last 24 hours) at 07/18/2018 1647 Last data filed at 07/18/2018 1632 Gross per 24 hour  Intake -  Output 2450 ml  Net -2450 ml    Labs/Imaging Results for orders placed or performed during the hospital encounter of 07/18/18 (from the past 48 hour(s))  CBC with Differential     Status: Abnormal   Collection Time: 07/17/18 11:33 PM  Result Value Ref Range   WBC 5.9 4.0 - 10.5 K/uL   RBC 4.03 (L) 4.22 - 5.81 MIL/uL   Hemoglobin 10.0 (L) 13.0 - 17.0 g/dL   HCT 21.3 (L) 08.6 - 57.8 %   MCV 80.9 80.0 - 100.0 fL   MCH 24.8 (L) 26.0 - 34.0 pg   MCHC 30.7 30.0 - 36.0 g/dL   RDW 46.9 (H) 62.9 - 52.8 %   Platelets 266 150 - 400 K/uL   nRBC 0.0 0.0 - 0.2 %  Neutrophils Relative % 67 %   Neutro Abs 4.0 1.7 - 7.7 K/uL   Lymphocytes Relative 19 %   Lymphs Abs 1.1 0.7 - 4.0 K/uL   Monocytes Relative 12 %   Monocytes Absolute 0.7 0.1 - 1.0 K/uL   Eosinophils Relative 1 %   Eosinophils Absolute 0.1 0.0 - 0.5 K/uL   Basophils Relative 1 %   Basophils Absolute 0.1 0.0 - 0.1 K/uL   Immature Granulocytes 0 %   Abs Immature Granulocytes 0.02 0.00 - 0.07 K/uL    Comment: Performed at [facility], [address] [place], [place] [address]  Comprehensive metabolic panel     Status: Abnormal   Collection Time: 07/17/18 11:33 PM  Result Value Ref Range   Sodium 137 135 - 145 mmol/L   Potassium 4.0 3.5 - 5.1 mmol/L   Chloride 105 98 - 111 mmol/L   CO2 23 22 - 32 mmol/L   Glucose, Bld 119 (H) 70 - 99 mg/dL   BUN 15 6 - 20 mg/dL   Creatinine, Ser  6.04 0.61 - 1.24 mg/dL   Calcium 8.2 (L) 8.9 - 10.3 mg/dL   Total Protein 7.0 6.5 - 8.1 g/dL   Albumin 3.5 3.5 - 5.0 g/dL   AST 20 15 - 41 U/L   ALT 10 0 - 44 U/L   Alkaline Phosphatase 78 38 - 126 U/L   Total Bilirubin 0.7 0.3 - 1.2 mg/dL   GFR calc non Af Amer >60 >60 mL/min   GFR calc Af Amer >60 >60 mL/min   Anion gap 9 5 - 15    Comment: Performed at [facility], [address] [place], [place] [address]  Troponin I - ONCE - STAT     Status: Abnormal   Collection Time: 07/17/18 11:33 PM  Result Value Ref Range   Troponin I 0.03 (HH) <0.03 ng/mL    Comment: CRITICAL RESULT CALLED TO, READ BACK BY AND VERIFIED WITH AMY COHEN AT 0006 ON 07/18/2018 JJB Performed at [facility], [address] [place], [place] [address]   Brain natriuretic peptide     Status: Abnormal   Collection Time: 07/17/18 11:33 PM  Result Value Ref Range   B Natriuretic Peptide 290.0 (H) 0.0 - 100.0 pg/mL    Comment: Performed at [facility], [address], [place], [place] [address]  SARS Coronavirus 2 (CEPHEID- Performed in [facility] hospital lab), Hosp Order     Status: None   Collection Time: 07/18/18  1:45 AM  Result Value Ref Range   SARS Coronavirus 2 NEGATIVE NEGATIVE    Comment: (NOTE) If result is NEGATIVE SARS-CoV-2 target nucleic acids are NOT DETECTED. The SARS-CoV-2 RNA is generally detectable in upper and lower  respiratory specimens during the acute phase of infection. The lowest  concentration of SARS-CoV-2 viral copies this assay can detect is 250  copies / mL. A negative result does not preclude SARS-CoV-2 infection  and should not be used as the sole basis for treatment or other  patient management decisions.  A negative result may occur with  improper specimen collection / handling, submission of specimen other  than nasopharyngeal swab, presence of viral mutation(s) within the  areas targeted by this assay, and inadequate number of viral copies   (<250 copies / mL). A negative result must be combined with clinical  observations, patient history, and epidemiological information. If result is POSITIVE SARS-CoV-2 target nucleic acids are DETECTED. The SARS-CoV-2 RNA is generally  detectable in upper and lower  respiratory specimens dur ing the acute phase of infection.  Positive  results are indicative of active infection with SARS-CoV-2.  Clinical  correlation with patient history and other diagnostic information is  necessary to determine patient infection status.  Positive results do  not rule out bacterial infection or co-infection with other viruses. If result is PRESUMPTIVE POSTIVE SARS-CoV-2 nucleic acids MAY BE PRESENT.   A presumptive positive result was obtained on the submitted specimen  and confirmed on repeat testing.  While 2019 novel coronavirus  (SARS-CoV-2) nucleic acids may be present in the submitted sample  additional confirmatory testing may be necessary for epidemiological  and / or clinical management purposes  to differentiate between  SARS-CoV-2 and other Sarbecovirus currently known to infect humans.  If clinically indicated additional testing with an alternate test  methodology [phone]) is advised. The SARS-CoV-2 RNA is generally  detectable in upper and lower respiratory sp ecimens during the acute  phase of infection. The expected result is Negative. Fact Sheet for Patients:  BoilerBrush.com.cy Fact Sheet for Healthcare Providers: https://pope.com/ This test is not yet approved or cleared by the [place] FDA and has been authorized for detection and/or diagnosis of SARS-CoV-2 by FDA under an Emergency Use Authorization (EUA).  This EUA will remain in effect (meaning this test can be used) for the duration of the COVID-19 declaration under Section 564(b)(1) of the Act, 21 U.S.C. section 360bbb-3(b)(1), unless the authorization is terminated  or revoked sooner. Performed at [facility], [address] [place], [place] [address]   Troponin I - Once-Timed     Status: Abnormal   Collection Time: 07/18/18  1:45 AM  Result Value Ref Range   Troponin I 0.04 (HH) <0.03 ng/mL    Comment: CRITICAL VALUE NOTED. VALUE IS CONSISTENT WITH PREVIOUSLY REPORTED/CALLED VALUE JJB Performed at [facility], [address] [place], [place] [address]   Troponin I - ONCE - STAT     Status: Abnormal   Collection Time: 07/18/18  6:14 AM  Result Value Ref Range   Troponin I 0.03 (HH) <0.03 ng/mL    Comment: CRITICAL VALUE NOTED. VALUE IS CONSISTENT WITH PREVIOUSLY REPORTED/CALLED VALUE/hkp Performed at [facility], [address] [place], [place] [address]   TSH     Status: Abnormal   Collection Time: 07/18/18  6:14 AM  Result Value Ref Range   TSH 4.518 (H) 0.350 - 4.500 uIU/mL    Comment: Performed by a 3rd Generation assay with a functional sensitivity of <=0.01 uIU/mL. Performed at [facility], [address] [place], [place] [address]    Dg Chest Port 1 View  Result Date: 07/18/2018 CLINICAL DATA:  Left-sided chest pain EXAM: PORTABLE CHEST 1 VIEW COMPARISON:  05/29/2018 FINDINGS: The cardiac silhouette is enlarged. There is mild volume overload. No pneumothorax. No large pleural effusion. No acute osseous abnormality. IMPRESSION: Cardiomegaly with mild volume overload. Electronically Signed   By: Katherine Mantle M.D.   On: 07/18/2018 01:41    Pending Labs Unresulted Labs (From admission, onward)    Start     Ordered   07/25/18 0500  Creatinine, serum  (enoxaparin (LOVENOX)    CrCl >/= 30 ml/min)  Weekly,   STAT    Comments:  while on enoxaparin therapy    07/18/18 0716   07/19/18 0500  Basic metabolic panel  Tomorrow morning,   STAT     07/18/18 0716   07/18/18 1122  Hemoglobin A1c  Add-on,   AD     07/18/18 1121          Vitals/Pain Today's Vitals   07/18/18 1430 07/18/18  1459 07/18/18 1500 07/18/18 1515  BP:  (!) 150/85 99/85   Pulse: (!) 50 69  64  Resp:  20    Temp:      TempSrc:      SpO2: (!) 85% 100%  100%  Weight:      Height:      PainSc:        Isolation Precautions No active isolations  Medications Medications  rivaroxaban (XARELTO) tablet 20 mg (has no administration in time range)  mometasone-formoterol (DULERA) 200-5 MCG/ACT inhaler 2 puff (2 puffs Inhalation Given 07/18/18 0821)  albuterol (PROVENTIL) (2.5 MG/3ML) 0.083% nebulizer solution 2.5 mg (has no administration in time range)  acetaminophen (TYLENOL) tablet 650 mg (has no administration in time range)    Or  acetaminophen (TYLENOL) suppository 650 mg (has no administration in time range)  ondansetron (ZOFRAN) tablet 4 mg (has no administration in time range)    Or  ondansetron (ZOFRAN) injection 4 mg (has no administration in time range)  docusate sodium (COLACE) capsule 100 mg (100 mg Oral Not Given 07/18/18 1012)  methylPREDNISolone sodium succinate (SOLU-MEDROL) 125 mg/2 mL injection 125 mg (125 mg Intravenous Given 07/18/18 0148)  fentaNYL (SUBLIMAZE) injection 100 mcg (100 mcg Intravenous Given 07/18/18 0148)  ondansetron (ZOFRAN) injection 4 mg (4 mg Intravenous Given 07/18/18 0147)  furosemide (LASIX) injection 40 mg (40 mg Intravenous Given 07/18/18 0159)  ipratropium-albuterol (DUONEB) 0.5-2.5 (3) MG/3ML nebulizer solution 3 mL (3 mLs Nebulization Given 07/18/18 0343)  ipratropium-albuterol (DUONEB) 0.5-2.5 (3) MG/3ML nebulizer solution 3 mL (3 mLs Nebulization Given 07/18/18 0343)  furosemide (LASIX) injection 40 mg (40 mg Intravenous Given 07/18/18 8250)    Mobility walks with device Low fall risk   Focused Assessments 1   R Recommendations: See Admitting Provider Note  Report given to:   Additional Notes: Pt is already on a hospital bed, please remove bed from pt's room, thanks

## 2018-07-18 NOTE — Progress Notes (Signed)
Advanced care plan. Purpose of the Encounter: CODE STATUS Parties in Attendance: Patient Patient's Decision Capacity: Good Subjective/Patient's story: The patient with past medical history of CHF, A. fib and sleep apnea presents to the emergency department complaining of chest pain.  The patient reports that he persistently has a dull ache.  He is not clear about when the chest pain started but nothing seems to alleviate the tightness in his chest.  He reports that laying down seems to have made him more aware of the pain and that he cannot get comfortable.  Oxygen saturations were initially in the high 80s.  The patient was placed on oxygen via nasal cannula that still did not significantly improve oxygen saturations.  He received multiple breathing treatments as well as Solu-Medrol.  Chest x-ray showed pulmonary edema and the patient was given Lasix 40 mg IV.  He had good urine output and was started on CPAP.  Troponin was unchanged from baseline but EKG showed some T wave inversions in inferior leads.  Due to persistent chest pain as well as pulmonary edema the emergency department staff called the hospitalist service for admission. Objective/Medical story Patient needs diuresis and removal of excess fluid.  Cardiology evaluation.  Further evaluation with echocardiogram. Goals of care determination:  Advance care directives and golas of care discussed Patient wants everything done which includes cpr and intubation and ventilator if the need arises. CODE STATUS:  Full code Time spent discussing advanced care planning: 16 minutes

## 2018-07-18 NOTE — Plan of Care (Signed)
  Problem: Clinical Measurements: Goal: Respiratory complications will improve Outcome: Progressing Goal: Cardiovascular complication will be avoided Outcome: Progressing   Problem: Activity: Goal: Capacity to carry out activities will improve Outcome: Progressing   Problem: Education: Goal: Knowledge of General Education information will improve Description Including pain rating scale, medication(s)/side effects and non-pharmacologic comfort measures Outcome: Not Progressing   Problem: Health Behavior/Discharge Planning: Goal: Ability to manage health-related needs will improve Outcome: Not Progressing   Problem: Pain Managment: Goal: General experience of comfort will improve Outcome: Not Progressing Note:  Patient has requested dilauded for chronic back pain. Educated about pain regimen.   Problem: Education: Goal: Ability to demonstrate management of disease process will improve Outcome: Not Progressing Note:  Patient has refused to watch videos. Discussed diet, weight control and utilizing scale. Goal: Individualized Educational Video(s) Outcome: Not Progressing   Problem: Education: Goal: Knowledge of General Education information will improve Description Including pain rating scale, medication(s)/side effects and non-pharmacologic comfort measures Outcome: Not Progressing   Problem: Health Behavior/Discharge Planning: Goal: Ability to manage health-related needs will improve Outcome: Not Progressing   Problem: Pain Managment: Goal: General experience of comfort will improve Outcome: Not Progressing Note:  Patient has requested dilauded for chronic back pain. Educated about pain regimen.   Problem: Education: Goal: Ability to demonstrate management of disease process will improve Outcome: Not Progressing Note:  Patient has refused to watch videos. Discussed diet, weight control and utilizing scale. Goal: Individualized Educational Video(s) Outcome: Not  Progressing

## 2018-07-18 NOTE — ED Notes (Signed)
Pt unlabored at this time. Waiting on admit bed. No needs currently. Remains in recliner.

## 2018-07-18 NOTE — ED Notes (Signed)
Pt placed in recliner to make more comfortable and so that monitor will stay on.

## 2018-07-18 NOTE — H&P (Addendum)
[name] [name] is an 58 y.o. male.   Chief Complaint: Chest pain HPI: The patient with past medical history of CHF, A. fib and sleep apnea presents to the emergency department complaining of chest pain.  The patient reports that he persistently has a dull ache.  He is not clear about when the chest pain started but nothing seems to alleviate the tightness in his chest.  He reports that laying down seems to have made him more aware of the pain and that he cannot get comfortable.  Oxygen saturations were initially in the high 80s.  The patient was placed on oxygen via nasal cannula that still did not significantly improve oxygen saturations.  He received multiple breathing treatments as well as Solu-Medrol.  Chest x-ray showed pulmonary edema and the patient was given Lasix 40 mg IV.  He had good urine output and was started on CPAP.  Troponin was unchanged from baseline but EKG showed some T wave inversions in inferior leads.  Due to persistent chest pain as well as pulmonary edema the emergency department staff called the hospitalist service for admission.  Past Medical History:  Diagnosis Date  . Asthma   . Brain damage   . COPD (chronic obstructive pulmonary disease) (HCC)   . HFrEF (heart failure with reduced ejection fraction) (HCC)    a. 03/2018 Echo: EF 25-30%, diff HK. Mod dil LA.  [name] Persistent atrial fibrillation    a. 03/2018 s/p DCCV; b. CHA2DS2VASc = 1-->Xarelto; c. 05/2018 recurrent afib-->Amio initiated;   . Sleep apnea     Past Surgical History:  Procedure Laterality Date  . CARDIOVERSION N/A 03/24/2018   Procedure: CARDIOVERSION;  Surgeon: Antonieta Iba, MD;  Location: [facility] ORS;  Service: Cardiovascular;  Laterality: N/A;  . hearnia repair     X 3- total of two surgeries  . LEG SURGERY      Family History  Problem Relation Age of Onset  . Heart failure Mother   . Lung cancer Mother   . Lung cancer Father   . Heart attack Maternal Grandmother   . Heart attack Maternal  Grandfather    Social History:  reports that he has never smoked. He has never used smokeless tobacco. He reports that he does not drink alcohol or use drugs.  Allergies: No Known Allergies  Prior to Admission medications   Medication Sig Start Date End Date Taking? Authorizing Provider  albuterol (VENTOLIN HFA) 108 (90 Base) MCG/ACT inhaler Inhale 2 puffs into the lungs every 6 (six) hours as needed for wheezing or shortness of breath. 07/04/18  Yes Johnson, Megan P, DO  allopurinol (ZYLOPRIM) 100 MG tablet Take 1 tablet (100 mg total) by mouth daily. 06/27/18  Yes Johnson, Megan P, DO  amiodarone (PACERONE) 200 MG tablet Take 1 tablet (200 mg total) by mouth daily. 07/11/18  Yes End, Cristal Deer, MD  atorvastatin (LIPITOR) 40 MG tablet Take 1 tablet (40 mg total) by mouth daily. 06/06/18  Yes Johnson, Megan P, DO  budesonide-formoterol (SYMBICORT) 160-4.5 MCG/ACT inhaler Inhale 2 puffs into the lungs 2 (two) times daily. 07/04/18  Yes Johnson, Megan P, DO  ferrous sulfate 325 (65 FE) MG tablet Take 1 tablet (325 mg total) by mouth 2 (two) times daily with a meal. 07/05/18  Yes Johnson, Megan P, DO  fluticasone (FLONASE) 50 MCG/ACT nasal spray Place 2 sprays into both nostrils 2 (two) times daily. 06/01/18  Yes Salary, Evelena Asa, MD  gabapentin (NEURONTIN) 300 MG capsule Take 1 capsule (300 mg  total) by mouth 3 (three) times daily. 07/04/18  Yes Johnson, Megan P, DO  ketotifen (ZADITOR) 0.025 % ophthalmic solution 1 drop 2 (two) times daily.   Yes [provider]  levothyroxine (SYNTHROID) 25 MCG tablet Take 1 tablet (25 mcg total) by mouth daily before breakfast. 07/05/18  Yes Johnson, Megan P, DO  lisinopril (ZESTRIL) 5 MG tablet Take 0.5 tablets (2.5 mg total) by mouth daily. 06/06/18  Yes Johnson, Megan P, DO  loratadine (CLARITIN) 10 MG tablet Take 10 mg by mouth daily.   Yes [provider]  meloxicam (MOBIC) 15 MG tablet Take 1 tablet (15 mg total) by mouth daily. 07/04/18  Yes  Johnson, Megan P, DO  metoprolol succinate (TOPROL-XL) 50 MG 24 hr tablet Take 1 tablet (50 mg total) by mouth 2 (two) times daily. Take with or immediately following a meal. 07/04/18  Yes Johnson, Megan P, DO  nitroGLYCERIN (NITROSTAT) 0.4 MG SL tablet Place 1 tablet (0.4 mg total) under the tongue every 5 (five) minutes as needed for chest pain. 01/18/18  Yes Auburn Bilberry, MD  potassium chloride SA (K-DUR) 20 MEQ tablet Take 1 tablet (20 mEq total) by mouth 2 (two) times daily. 06/06/18  Yes Johnson, Megan P, DO  rivaroxaban (XARELTO) 20 MG TABS tablet Take 1 tablet (20 mg total) by mouth daily with supper. 06/29/18  Yes End, Cristal Deer, MD  spironolactone (ALDACTONE) 25 MG tablet Take 1 tablet (25 mg total) by mouth daily. 07/04/18  Yes Johnson, Megan P, DO  Thiamine HCl (B-1) 250 MG TABS Take 1 tablet by mouth daily.   Yes [provider]  torsemide (DEMADEX) 20 MG tablet Take 2 tablets (40 mg total) by mouth daily. 07/04/18  Yes Johnson, Megan P, DO  traZODone (DESYREL) 150 MG tablet Take 1 tablet (150 mg total) by mouth at bedtime as needed for sleep. 06/06/18  Yes [name], DO     Results for orders placed or performed during the hospital encounter of 07/18/18 (from the past 48 hour(s))  CBC with Differential     Status: Abnormal   Collection Time: 07/17/18 11:33 PM  Result Value Ref Range   WBC 5.9 4.0 - 10.5 K/uL   RBC 4.03 (L) 4.22 - 5.81 MIL/uL   Hemoglobin 10.0 (L) 13.0 - 17.0 g/dL   HCT 01.0 (L) 93.2 - 35.5 %   MCV 80.9 80.0 - 100.0 fL   MCH 24.8 (L) 26.0 - 34.0 pg   MCHC 30.7 30.0 - 36.0 g/dL   RDW 73.2 (H) 20.2 - 54.2 %   Platelets 266 150 - 400 K/uL   nRBC 0.0 0.0 - 0.2 %   Neutrophils Relative % 67 %   Neutro Abs 4.0 1.7 - 7.7 K/uL   Lymphocytes Relative 19 %   Lymphs Abs 1.1 0.7 - 4.0 K/uL   Monocytes Relative 12 %   Monocytes Absolute 0.7 0.1 - 1.0 K/uL   Eosinophils Relative 1 %   Eosinophils Absolute 0.1 0.0 - 0.5 K/uL   Basophils Relative 1 %    Basophils Absolute 0.1 0.0 - 0.1 K/uL   Immature Granulocytes 0 %   Abs Immature Granulocytes 0.02 0.00 - 0.07 K/uL    Comment: Performed at [facility], [address] [place], [place] [address]  Comprehensive metabolic panel     Status: Abnormal   Collection Time: 07/17/18 11:33 PM  Result Value Ref Range   Sodium 137 135 - 145 mmol/L   Potassium 4.0 3.5 -  5.1 mmol/L   Chloride 105 98 - 111 mmol/L   CO2 23 22 - 32 mmol/L   Glucose, Bld 119 (H) 70 - 99 mg/dL   BUN 15 6 - 20 mg/dL   Creatinine, Ser 2.50 0.61 - 1.24 mg/dL   Calcium 8.2 (L) 8.9 - 10.3 mg/dL   Total Protein 7.0 6.5 - 8.1 g/dL   Albumin 3.5 3.5 - 5.0 g/dL   AST 20 15 - 41 U/L   ALT 10 0 - 44 U/L   Alkaline Phosphatase 78 38 - 126 U/L   Total Bilirubin 0.7 0.3 - 1.2 mg/dL   GFR calc non Af Amer >60 >60 mL/min   GFR calc Af Amer >60 >60 mL/min   Anion gap 9 5 - 15    Comment: Performed at [facility], [address] [place], [place] [address]  Troponin I - ONCE - STAT     Status: Abnormal   Collection Time: 07/17/18 11:33 PM  Result Value Ref Range   Troponin I 0.03 (HH) <0.03 ng/mL    Comment: CRITICAL RESULT CALLED TO, READ BACK BY AND VERIFIED WITH AMY COHEN AT 0006 ON 07/18/2018 JJB Performed at [facility], [address], [place], [place] [address]   SARS Coronavirus 2 (CEPHEID- Performed in [facility] hospital lab), Hosp Order     Status: None   Collection Time: 07/18/18  1:45 AM  Result Value Ref Range   SARS Coronavirus 2 NEGATIVE NEGATIVE    Comment: (NOTE) If result is NEGATIVE SARS-CoV-2 target nucleic acids are NOT DETECTED. The SARS-CoV-2 RNA is generally detectable in upper and lower  respiratory specimens during the acute phase of infection. The lowest  concentration of SARS-CoV-2 viral copies this assay can detect is 250  copies / mL. A negative result does not preclude SARS-CoV-2 infection  and should not be used as the sole basis for treatment or other   patient management decisions.  A negative result may occur with  improper specimen collection / handling, submission of specimen other  than nasopharyngeal swab, presence of viral mutation(s) within the  areas targeted by this assay, and inadequate number of viral copies  (<250 copies / mL). A negative result must be combined with clinical  observations, patient history, and epidemiological information. If result is POSITIVE SARS-CoV-2 target nucleic acids are DETECTED. The SARS-CoV-2 RNA is generally detectable in upper and lower  respiratory specimens dur ing the acute phase of infection.  Positive  results are indicative of active infection with SARS-CoV-2.  Clinical  correlation with patient history and other diagnostic information is  necessary to determine patient infection status.  Positive results do  not rule out bacterial infection or co-infection with other viruses. If result is PRESUMPTIVE POSTIVE SARS-CoV-2 nucleic acids MAY BE PRESENT.   A presumptive positive result was obtained on the submitted specimen  and confirmed on repeat testing.  While 2019 novel coronavirus  (SARS-CoV-2) nucleic acids may be present in the submitted sample  additional confirmatory testing may be necessary for epidemiological  and / or clinical management purposes  to differentiate between  SARS-CoV-2 and other Sarbecovirus currently known to infect humans.  If clinically indicated additional testing with an alternate test  methodology [phone]) is advised. The SARS-CoV-2 RNA is generally  detectable in upper and lower respiratory sp ecimens during the acute  phase of infection. The expected result is Negative. Fact Sheet for Patients:  BoilerBrush.com.cy Fact Sheet for Healthcare Providers: https://pope.com/ This test is not  yet approved or cleared by the [place] and has been authorized for detection and/or diagnosis of SARS-CoV-2  by FDA under an Emergency Use Authorization (EUA).  This EUA will remain in effect (meaning this test can be used) for the duration of the COVID-19 declaration under Section 564(b)(1) of the Act, 21 U.S.C. section 360bbb-3(b)(1), unless the authorization is terminated or revoked sooner. Performed at [facility] , [address] [place], [place] [address]   Troponin I - Once-Timed     Status: Abnormal   Collection Time: 07/18/18  1:45 AM  Result Value Ref Range   Troponin I 0.04 (HH) <0.03 ng/mL    Comment: CRITICAL VALUE NOTED. VALUE IS CONSISTENT WITH PREVIOUSLY REPORTED/CALLED VALUE JJB Performed at [facility], [address] [place], [place] [address]    Dg Chest Port 1 View  Result Date: 07/18/2018 CLINICAL DATA:  Left-sided chest pain EXAM: PORTABLE CHEST 1 VIEW COMPARISON:  05/29/2018 FINDINGS: The cardiac silhouette is enlarged. There is mild volume overload. No pneumothorax. No large pleural effusion. No acute osseous abnormality. IMPRESSION: Cardiomegaly with mild volume overload. Electronically Signed   By: Katherine Mantle M.D.   On: 07/18/2018 01:41    Review of Systems  Constitutional: Negative for chills and fever.  HENT: Negative for sore throat and tinnitus.   Eyes: Negative for blurred vision and redness.  Respiratory: Positive for shortness of breath. Negative for cough.   Cardiovascular: Negative for chest pain, palpitations, orthopnea and PND.  Gastrointestinal: Negative for abdominal pain, diarrhea, nausea and vomiting.  Genitourinary: Negative for dysuria, frequency and urgency.  Musculoskeletal: Negative for joint pain and myalgias.  Skin: Negative for rash.       No lesions  Neurological: Negative for speech change, focal weakness and weakness.  Endo/Heme/Allergies: Does not bruise/bleed easily.       No temperature intolerance  Psychiatric/Behavioral: Negative for depression and suicidal ideas.    Blood pressure (!) 143/80,  pulse 89, temperature 98.3 F (36.8 C), temperature source Oral, resp. rate 15, height 5\' 8"  (1.727 m), weight (!) 178.3 kg, SpO2 95 %. Physical Exam  Vitals reviewed. Constitutional: He is oriented to person, place, and time. He appears well-developed and well-nourished. No distress.  HENT:  Head: Normocephalic and atraumatic.  Mouth/Throat: Oropharynx is clear and moist.  Eyes: Pupils are equal, round, and reactive to light. Conjunctivae are normal. No scleral icterus.  Neck: Normal range of motion. Neck supple. No JVD present. No tracheal deviation present. No thyromegaly present.  Cardiovascular: Normal rate, regular rhythm and normal heart sounds. Exam reveals no gallop and no friction rub.  No murmur heard. Respiratory: Breath sounds normal. He is in respiratory distress.  GI: Soft. Bowel sounds are normal. He exhibits no distension. There is no abdominal tenderness.  Genitourinary:    Genitourinary Comments: Deferred   Musculoskeletal: Normal range of motion.        General: No edema.  Lymphadenopathy:    He has no cervical adenopathy.  Neurological: He is alert and oriented to person, place, and time. No cranial nerve deficit.  Skin: Skin is warm and dry. No rash noted. No erythema.  Psychiatric: He has a normal mood and affect. His behavior is normal. Judgment and thought content normal.     Assessment/Plan This is a 58 year old male admitted for CHF exacerbation. 1.  CHF: Acute on chronic; systolic.  Last EF 25 to 30% (February 2020).  Continue Lasix and spironolactone.  Resume oral torsemide once patient is euvolemic.  Monitor kidney function.  Continue CPAP.  Repeat echocardiogram at discretion of primary team..  Consult cardiology.  Monitor telemetry 2.  COPD: Contributing to shortness of breath.  Taper steroids.  Continue inhaled corticosteroid as well as anticholinergic agent.  Albuterol as needed. 3.  Atrial fibrillation: Rate controlled; continue metoprolol and  amiodarone.  Xarelto for anticoagulation 4.  Hypertension: Controlled; continue lisinopril 5.  Hypothyroidism: Continue Synthroid 6.  Morbid obesity: BMI is 59.7; encouraged healthy diet and exercise 7.  DVT prophylaxis: Therapeutic anticoagulation 8.  GI prophylaxis: None The patient is a full code.  Time spent on admission orders and patient care approximately 45 minutes  Arnaldo Natal, MD 07/18/2018, 4:23 AM

## 2018-07-18 NOTE — ED Notes (Signed)
Pt given dinner tray.

## 2018-07-18 NOTE — ED Notes (Signed)
RT up to add O2 to CPAP

## 2018-07-18 NOTE — ED Provider Notes (Signed)
[facility] Emergency Department Provider Note  ____________________________________________  Time seen: Approximately 1:54 AM  I have reviewed the triage vital signs and the nursing notes.   HISTORY  Chief Complaint Chest Pain   HPI [name] [name] is a 58 y.o. male with a history of CHF with a EF of 25-30% , COPD, atrial fibrillation on Xarelto, DVT and PE, diabetes who presents for evaluation of chest pain and shortness of breath.  Patient reports 3 days of progressively worsening shortness of breath.  Has had intermittent wheezing.  This evening at 10 PM he was trying to fall asleep but continued to feel short of breath and can find a comfortable position.  He then started having what he describes as an achy moderate constant pain on the left side of his chest.  He has had no cough, no fever.  He reports feeling nauseous but no vomiting or diarrhea.  No known exposure to COVID.  He endorses compliance with his Xarelto and diuretics.  He denies ever having a PE or DVT while being on Xarelto.  He denies pleuritic chest pain.  He did not try his rescue inhaler at home.  Past Medical History:  Diagnosis Date   Asthma    Brain damage    COPD (chronic obstructive pulmonary disease) (HCC)    HFrEF (heart failure with reduced ejection fraction) (HCC)    a. 03/2018 Echo: EF 25-30%, diff HK. Mod dil LA.   Persistent atrial fibrillation    a. 03/2018 s/p DCCV; b. CHA2DS2VASc = 1-->Xarelto; c. 05/2018 recurrent afib-->Amio initiated;    Sleep apnea     Patient Active Problem List   Diagnosis Date Noted   Personal history of DVT (deep vein thrombosis) 07/13/2018   History of pulmonary embolism (on Xarelto) 07/13/2018   Neck pain 07/13/2018   Thoracic spine pain 07/13/2018   Chronic bilateral low back pain with bilateral sciatica 07/13/2018   Chronic pain of both knees 07/13/2018   TBI (traumatic brain injury) (HCC) 07/04/2018   Anemia 06/27/2018    Abnormal thyroid blood test 06/27/2018   Diet-controlled diabetes mellitus (HCC) 06/06/2018   HTN (hypertension) 06/06/2018   Gout 06/06/2018   Morbid obesity (HCC) 06/06/2018   Chronic pain 06/06/2018   Ulcers of both lower extremities, limited to breakdown of skin (HCC) 06/06/2018   Persistent atrial fibrillation    Chronic systolic heart failure (HCC) 02/02/2018   COPD (chronic obstructive pulmonary disease) (HCC) 02/02/2018   Obstructive sleep apnea 02/02/2018   Acute respiratory failure with hypoxia (HCC) 01/27/2018    Past Surgical History:  Procedure Laterality Date   CARDIOVERSION N/A 03/24/2018   Procedure: CARDIOVERSION;  Surgeon: Antonieta Iba, MD;  Location: [facility] ORS;  Service: Cardiovascular;  Laterality: N/A;   hearnia repair     X 3- total of two surgeries   LEG SURGERY      Prior to Admission medications   Medication Sig Start Date End Date Taking? Authorizing Provider  albuterol (VENTOLIN HFA) 108 (90 Base) MCG/ACT inhaler Inhale 2 puffs into the lungs every 6 (six) hours as needed for wheezing or shortness of breath. 07/04/18   Johnson, Megan P, DO  allopurinol (ZYLOPRIM) 100 MG tablet Take 1 tablet (100 mg total) by mouth daily. 06/27/18   Johnson, Megan P, DO  amiodarone (PACERONE) 200 MG tablet Take 1 tablet (200 mg total) by mouth daily. 07/11/18   End, Cristal Deer, MD  atorvastatin (LIPITOR) 40 MG tablet Take 1 tablet (40 mg total) by  mouth daily. 06/06/18   Johnson, Megan P, DO  budesonide-formoterol (SYMBICORT) 160-4.5 MCG/ACT inhaler Inhale 2 puffs into the lungs 2 (two) times daily. 07/04/18   Johnson, Megan P, DO  ferrous sulfate 325 (65 FE) MG tablet Take 1 tablet (325 mg total) by mouth 2 (two) times daily with a meal. 07/05/18   Johnson, Megan P, DO  fluticasone (FLONASE) 50 MCG/ACT nasal spray Place 2 sprays into both nostrils 2 (two) times daily. 06/01/18   Salary, Evelena AsaMontell D, MD  gabapentin (NEURONTIN) 300 MG capsule Take 1 capsule (300 mg  total) by mouth 3 (three) times daily. 07/04/18   Johnson, Megan P, DO  ketotifen (ZADITOR) 0.025 % ophthalmic solution 1 drop 2 (two) times daily.    [provider]  levothyroxine (SYNTHROID) 25 MCG tablet Take 1 tablet (25 mcg total) by mouth daily before breakfast. 07/05/18   Laural BenesJohnson, Megan P, DO  lisinopril (ZESTRIL) 5 MG tablet Take 0.5 tablets (2.5 mg total) by mouth daily. 06/06/18   Johnson, Megan P, DO  loratadine (CLARITIN) 10 MG tablet Take 10 mg by mouth daily.    [provider]  meloxicam (MOBIC) 15 MG tablet Take 1 tablet (15 mg total) by mouth daily. 07/04/18   Johnson, Megan P, DO  metoprolol succinate (TOPROL-XL) 50 MG 24 hr tablet Take 1 tablet (50 mg total) by mouth 2 (two) times daily. Take with or immediately following a meal. 07/04/18   Johnson, Megan P, DO  nitroGLYCERIN (NITROSTAT) 0.4 MG SL tablet Place 1 tablet (0.4 mg total) under the tongue every 5 (five) minutes as needed for chest pain. 01/18/18   Auburn BilberryPatel, Shreyang, MD  potassium chloride SA (K-DUR) 20 MEQ tablet Take 1 tablet (20 mEq total) by mouth 2 (two) times daily. 06/06/18   Johnson, Megan P, DO  rivaroxaban (XARELTO) 20 MG TABS tablet Take 1 tablet (20 mg total) by mouth daily with supper. 06/29/18   End, Cristal Deerhristopher, MD  spironolactone (ALDACTONE) 25 MG tablet Take 1 tablet (25 mg total) by mouth daily. 07/04/18   Johnson, Megan P, DO  Thiamine HCl (B-1) 250 MG TABS Take 1 tablet by mouth daily.    [provider]  torsemide (DEMADEX) 20 MG tablet Take 2 tablets (40 mg total) by mouth daily. 07/04/18   Johnson, Megan P, DO  traZODone (DESYREL) 150 MG tablet Take 1 tablet (150 mg total) by mouth at bedtime as needed for sleep. 06/06/18   Dorcas CarrowJohnson, Megan P, DO    Allergies Patient has no known allergies.  Family History  Problem Relation Age of Onset   Heart failure Mother    Lung cancer Mother    Lung cancer Father    Heart attack Maternal Grandmother    Heart attack Maternal  Grandfather     Social History Social History   Tobacco Use   Smoking status: Never Smoker   Smokeless tobacco: Never Used  Substance Use Topics   Alcohol use: Never    Frequency: Never   Drug use: Never    Review of Systems  Constitutional: Negative for fever. Eyes: Negative for visual changes. ENT: Negative for sore throat. Neck: No neck pain  Cardiovascular: + chest pain. Respiratory: + shortness of breath, wheezing Gastrointestinal: Negative for abdominal pain, vomiting or diarrhea. + nausea Genitourinary: Negative for dysuria. Musculoskeletal: Negative for back pain. Skin: Negative for rash. Neurological: Negative for headaches, weakness or numbness. Psych: No SI or HI  ____________________________________________   PHYSICAL EXAM:  VITAL SIGNS: ED Triage Vitals  Enc Vitals Group     BP 07/17/18 2332 (!) 157/91     Pulse Rate 07/17/18 2332 73     Resp 07/17/18 2332 20     Temp 07/17/18 2332 98.3 F (36.8 C)     Temp Source 07/17/18 2332 Oral     SpO2 07/17/18 2332 94 %     Weight 07/17/18 2330 (!) 393 lb 1.3 oz (178.3 kg)     Height 07/17/18 2330 5\' 8"  (1.727 m)     Head Circumference --      Peak Flow --      Pain Score 07/17/18 2330 8     Pain Loc --      Pain Edu? --      Excl. in GC? --     Constitutional: Alert and oriented. Well appearing and in no apparent distress. HEENT:      Head: Normocephalic and atraumatic.         Eyes: Conjunctivae are normal. Sclera is non-icteric.       Mouth/Throat: Mucous membranes are moist.       Neck: Supple with no signs of meningismus. Cardiovascular: Irregularly irregular rhythm with normal rate. No murmurs, gallops, or rubs. 2+ symmetrical distal pulses are present in all extremities. No JVD. Respiratory: Tachypnea, normal work of breathing, decreased air movement bilaterally with no wheezing or crackles. Gastrointestinal: Soft, non tender, and non distended with positive bowel sounds. No rebound or  guarding. Musculoskeletal: Trace edema b/l LE Neurologic: Normal speech and language. Face is symmetric. Moving all extremities. No gross focal neurologic deficits are appreciated. Skin: Skin is warm, dry and intact. No rash noted. Psychiatric: Mood and affect are normal. Speech and behavior are normal.  ____________________________________________   LABS (all labs ordered are listed, but only abnormal results are displayed)  Labs Reviewed  CBC WITH DIFFERENTIAL/PLATELET - Abnormal; Notable for the following components:      Result Value   RBC 4.03 (*)    Hemoglobin 10.0 (*)    HCT 32.6 (*)    MCH 24.8 (*)    RDW 19.9 (*)    All other components within normal limits  COMPREHENSIVE METABOLIC PANEL - Abnormal; Notable for the following components:   Glucose, Bld 119 (*)    Calcium 8.2 (*)    All other components within normal limits  TROPONIN I - Abnormal; Notable for the following components:   Troponin I 0.03 (*)    All other components within normal limits  TROPONIN I - Abnormal; Notable for the following components:   Troponin I 0.04 (*)    All other components within normal limits  SARS CORONAVIRUS 2 (HOSPITAL ORDER, PERFORMED IN [facility])   ____________________________________________  EKG  ED ECG REPORT I, Nita Sickle, the attending physician, personally viewed and interpreted this ECG.  Atrial fibrillation with a left bundle branch block, rate of 83, normal QTC, normal axis, no concordant ST elevations.  No change from prior. ____________________________________________  RADIOLOGY  I have personally reviewed the images performed during this visit and I agree with the Radiologist's read.   Interpretation by Radiologist:  Dg Chest Port 1 View  Result Date: 07/18/2018 CLINICAL DATA:  Left-sided chest pain EXAM: PORTABLE CHEST 1 VIEW COMPARISON:  05/29/2018 FINDINGS: The cardiac silhouette is enlarged. There is mild volume overload. No  pneumothorax. No large pleural effusion. No acute osseous abnormality. IMPRESSION: Cardiomegaly with mild volume overload. Electronically Signed   By: Katherine Mantle M.D.   On: 07/18/2018  01:41     ____________________________________________   PROCEDURES  Procedure(s) performed: None Procedures Critical Care performed: yes  CRITICAL CARE Performed by: Nita Sickle  ?  Total critical care time: 35 min  Critical care time was exclusive of separately billable procedures and treating other patients.  Critical care was necessary to treat or prevent imminent or life-threatening deterioration.  Critical care was time spent personally by me on the following activities: development of treatment plan with patient and/or surrogate as well as nursing, discussions with consultants, evaluation of patient's response to treatment, examination of patient, obtaining history from patient or surrogate, ordering and performing treatments and interventions, ordering and review of laboratory studies, ordering and review of radiographic studies, pulse oximetry and re-evaluation of patient's condition.  ____________________________________________   INITIAL IMPRESSION / ASSESSMENT AND PLAN / ED COURSE  58 y.o. male with a history of CHF with a EF of 25-30% , COPD, atrial fibrillation on Xarelto, DVT and PE, diabetes who presents for evaluation of 3 days of progressively worsening shortness of breath and now with chest ache since 10PM.  Patient looks volume overloaded with trace pitting edema, chest x-ray with cardiomegaly and mild pulmonary edema.  He is sating 94% on room air, he has decreased air movement bilaterally with no wheezing or crackles auscultated at this time.  No asymmetric edema.  Differential diagnoses including CHF exacerbation, COPD exacerbation, ACS, pneumonia, COVID, viral URI.  With no fever, normal white count, no cough, no infiltrate on chest x-ray less likely pneumonia.  COVID  swab is pending.  Will give IV Lasix for CHF exacerbation.  Will also give steroids and breathing treatments for superimposed COPD.    _________________________ 4:05 AM on 07/18/2018 -----------------------------------------  Patient with 750 cc output.  COVID negative.  Received duo nebs and Solu-Medrol.  Just with minimal movement in bed and sitting up patient desatted to the upper 80s.  He was put on 2 L nasal cannula.  Continues to complain of significant shortness of breath.  Will admit to the hospitalist service.   As part of my medical decision making, I reviewed the following data within the electronic MEDICAL RECORD NUMBER Nursing notes reviewed and incorporated, Labs reviewed , EKG interpreted , Old EKG reviewed, Old chart reviewed, Radiograph reviewed , Notes from prior ED visits and [place] Controlled Substance Database    Pertinent labs & imaging results that were available during my care of the patient were reviewed by me and considered in my medical decision making (see chart for details).    ____________________________________________   FINAL CLINICAL IMPRESSION(S) / ED DIAGNOSES  Final diagnoses:  Acute on chronic congestive heart failure, unspecified heart failure type (HCC)  COPD exacerbation (HCC)  Acute respiratory failure with hypoxia (HCC)      NEW MEDICATIONS STARTED DURING THIS VISIT:  ED Discharge Orders    None       Note:  This document was prepared using Dragon voice recognition software and may include unintentional dictation errors.    Don Perking, [place], MD 07/18/18 [phone]

## 2018-07-18 NOTE — ED Notes (Signed)
Pt refusing breakfast at this time. Encourage to sit up and have lights on but patient just wants to sleep.

## 2018-07-18 NOTE — ED Notes (Signed)
Pt sitting in recliner. No needs. Remains to wait on admit bed.

## 2018-07-18 NOTE — ED Notes (Signed)
Given milk per request.

## 2018-07-18 NOTE — ED Notes (Signed)
Pt still does not cooperate with wearing bp cuff

## 2018-07-18 NOTE — ED Notes (Signed)
Pt feels very dizzy sitting up doing duo neb. States he is dizzy from time to time but todayit is much worse and he feels he cannot catch his breath

## 2018-07-18 NOTE — Consult Note (Addendum)
Cardiology Consultation:   Patient ID: [name] [name] MRN: [id number]; DOB: December 28, 1960  Admit date: 07/18/2018 Date of Consult: 07/18/2018  Primary Care Provider: Dorcas Carrow, DO Primary Cardiologist:Dr. End Primary Electrophysiologist:  None    Patient Profile:   [name] [name] is a 58 y.o. male with a hx of chronic systolic heart failure (EF 25-30%), persistent atrial fibrillation s/p cardioversion (03/2018), OSA, COPD, DM2, and morbid obesity, who is being seen today for the evaluation of acute on chronic heart failure and atypical CP at the request of Dr. Sheryle Hail.  History of Present Illness:   [name] is a 58 yo male with PMH as above. He was admitted to [facility] from 2/1 to 03/25/2018 for cellulitis complicated by acute on chronic systolic heart failure and atrial fibrillation with rapid ventricular response.  He underwent cardioversion 03/24/2018 with successful restoration of sinus rhythm.  He was discharged home on metoprolol succinate 50 mg twice daily, rivaroxaban 20 mg daily, torsemide 100 mg daily, Entresto 24-26 mg twice daily (changed to lisinopril 2.5 mg daily), and spironolactone 25 mg daily.  He did not keep follow-up at the heart failure clinic.  In April 2020, he was seen again at [facility] with c/o shortness of breath, emesis, fevers, and chills.  He was COVID-19 negative with suspicion for viral infection.  He also noted chest pain at that time, which she reported as intermittently present since his hospital stay as above in 03/2018.  Despite cardioversion, he continued to complain of palpitations with minimal activity.  He reported being compliant with some medications; however, he reportedly did not fill his Entresto or torsemide.  He was rate controlled and started on amiodarone load.  Given good heart rate control at rest, repeat cardioversion was deferred pending adequate amiodarone load.  He was maintained on Xarelto and discharged on torsemide 40 mg daily as well as  spironolactone 25 mg daily, lisinopril 2.5 mg daily, and metoprolol succinate 50 mg twice daily.  He was seen at telehealth follow-up 06/08/2018.  At that time, he reported he felt feverish but did not have a thermometer available to take his temperature.  He reported multiple arthralgias and myalgias.  He had not had any chest pain but did continue to note palpitations when moving around.  He also reported exertional DOE when walking around his house, though he stated it was gradually improving along with his LEE (especially since using his Unna boots).  He remained on CPAP. His weight was reportedly down to 380 pounds with Unna boots in place.  He remained on amiodarone 200 mg twice daily.  He alternated torsemide 40 mg daily and spironolactone lactone 25 mg daily, despite that both were prescribed to be taken daily.  Remain compliant with his Xarelto, though he did note occasional BRBPR.  He stated this was a chronic problem for him in the past, prompting a colonoscopy approximately 1 year earlier that found hemorrhoids.  He stated bleeding was only present in the past with bowel movements, however, now he occasionally noted a small amount of blood in his underwear even when he had not had a bowel movement.  Also noted past lightheadedness but denied any feelings of lightheadedness at the time of his visit, despite feeling unsteady on his feet if standing for long periods of time.  From Jul 14, 2018- 07/17/2018, he reportedly felt progressively short of breath.  Around 10 PM on 6/1, he was trying to follow sleep but continued to feel short of breath and could not  find a comfortable position.  He then reported feeling chest tightness on the left side of his chest, as well as left lower rib pain that felt dull and achy and was made worse with deep breathing.  He stated that he had had this pain before in the past when he was volume overloaded.  He described the pain as pleuritic, nonradiating, constant, and 8/10  at its worst.  Given his shortness of breath and chest pain, he suspected volume overload and decided to present to [facility] a emergency department.  He denied nausea, vomiting, and emesis.   In the ED, he reported his chest pain had improved to 6/10.  Vitals were significant for BP 157/91, HR 73, RR 20, T 90 8.48F, SpO2 94% ORA, weight 393lbs.  EKG showed atrial fibrillation, known left bundle branch block, ventricular rate of 83, no acute ST/T wave changes.  Chest x-ray showed cardiomegaly with mild volume overload.  Labs significant for sodium 137, potassium 4.0, creatinine 0.95, BUN 15, troponin 0.03  0.04  0.03, hgb 10., RBC 4.03, TSH 4.518.  Initial COVID-19 negative.  He was started on IV Lasix, steroids, O2, and breathing treatments with cardiology consulted.  Past Medical History:  Diagnosis Date   Asthma    Brain damage    COPD (chronic obstructive pulmonary disease) (HCC)    HFrEF (heart failure with reduced ejection fraction) (HCC)    a. 03/2018 Echo: EF 25-30%, diff HK. Mod dil LA.   Persistent atrial fibrillation    a. 03/2018 s/p DCCV; b. CHA2DS2VASc = 1-->Xarelto; c. 05/2018 recurrent afib-->Amio initiated;    Sleep apnea     Past Surgical History:  Procedure Laterality Date   CARDIOVERSION N/A 03/24/2018   Procedure: CARDIOVERSION;  Surgeon: Antonieta Iba, MD;  Location: [facility] ORS;  Service: Cardiovascular;  Laterality: N/A;   hearnia repair     X 3- total of two surgeries   LEG SURGERY       Home Medications:  Prior to Admission medications   Medication Sig Start Date End Date Taking? Authorizing Provider  albuterol (VENTOLIN HFA) 108 (90 Base) MCG/ACT inhaler Inhale 2 puffs into the lungs every 6 (six) hours as needed for wheezing or shortness of breath. 07/04/18  Yes Johnson, Megan P, DO  allopurinol (ZYLOPRIM) 100 MG tablet Take 1 tablet (100 mg total) by mouth daily. 06/27/18  Yes Johnson, Megan P, DO  amiodarone (PACERONE) 200 MG tablet Take 1 tablet (200 mg total)  by mouth daily. 07/11/18  Yes End, Cristal Deer, MD  atorvastatin (LIPITOR) 40 MG tablet Take 1 tablet (40 mg total) by mouth daily. 06/06/18  Yes Johnson, Megan P, DO  budesonide-formoterol (SYMBICORT) 160-4.5 MCG/ACT inhaler Inhale 2 puffs into the lungs 2 (two) times daily. 07/04/18  Yes Johnson, Megan P, DO  ferrous sulfate 325 (65 FE) MG tablet Take 1 tablet (325 mg total) by mouth 2 (two) times daily with a meal. 07/05/18  Yes Johnson, Megan P, DO  fluticasone (FLONASE) 50 MCG/ACT nasal spray Place 2 sprays into both nostrils 2 (two) times daily. 06/01/18  Yes Salary, Evelena Asa, MD  gabapentin (NEURONTIN) 300 MG capsule Take 1 capsule (300 mg total) by mouth 3 (three) times daily. 07/04/18  Yes Johnson, Megan P, DO  ketotifen (ZADITOR) 0.025 % ophthalmic solution 1 drop 2 (two) times daily.   Yes [provider]  levothyroxine (SYNTHROID) 25 MCG tablet Take 1 tablet (25 mcg total) by mouth daily before breakfast. 07/05/18  Yes Johnson, Megan P, DO  lisinopril (ZESTRIL) 5 MG tablet Take 0.5 tablets (2.5 mg total) by mouth daily. 06/06/18  Yes Johnson, Megan P, DO  loratadine (CLARITIN) 10 MG tablet Take 10 mg by mouth daily.   Yes [provider]  meloxicam (MOBIC) 15 MG tablet Take 1 tablet (15 mg total) by mouth daily. 07/04/18  Yes Johnson, Megan P, DO  metoprolol succinate (TOPROL-XL) 50 MG 24 hr tablet Take 1 tablet (50 mg total) by mouth 2 (two) times daily. Take with or immediately following a meal. 07/04/18  Yes Johnson, Megan P, DO  nitroGLYCERIN (NITROSTAT) 0.4 MG SL tablet Place 1 tablet (0.4 mg total) under the tongue every 5 (five) minutes as needed for chest pain. 01/18/18  Yes Auburn Bilberry, MD  potassium chloride SA (K-DUR) 20 MEQ tablet Take 1 tablet (20 mEq total) by mouth 2 (two) times daily. 06/06/18  Yes Johnson, Megan P, DO  rivaroxaban (XARELTO) 20 MG TABS tablet Take 1 tablet (20 mg total) by mouth daily with supper. 06/29/18  Yes End, Cristal Deer, MD  spironolactone  (ALDACTONE) 25 MG tablet Take 1 tablet (25 mg total) by mouth daily. 07/04/18  Yes Johnson, Megan P, DO  Thiamine HCl (B-1) 250 MG TABS Take 1 tablet by mouth daily.   Yes [provider]  torsemide (DEMADEX) 20 MG tablet Take 2 tablets (40 mg total) by mouth daily. 07/04/18  Yes Johnson, Megan P, DO  traZODone (DESYREL) 150 MG tablet Take 1 tablet (150 mg total) by mouth at bedtime as needed for sleep. 06/06/18  Yes Johnson, Megan P, DO    Inpatient Medications: Scheduled Meds:  docusate sodium  100 mg Oral BID   mometasone-formoterol  2 puff Inhalation BID   rivaroxaban  20 mg Oral Q supper   Continuous Infusions:  PRN Meds: acetaminophen **OR** acetaminophen, albuterol, ondansetron **OR** ondansetron (ZOFRAN) IV  Allergies:   No Known Allergies  Social History:   Social History   Socioeconomic History   Marital status: Single    Spouse name: Not on file   Number of children: Not on file   Years of education: Not on file   Highest education level: Not on file  Occupational History   Not on file  Social Needs   Financial resource strain: Not on file   Food insecurity:    Worry: Not on file    Inability: Not on file   Transportation needs:    Medical: Not on file    Non-medical: Not on file  Tobacco Use   Smoking status: Never Smoker   Smokeless tobacco: Never Used  Substance and Sexual Activity   Alcohol use: Never    Frequency: Never   Drug use: Never   Sexual activity: Not Currently  Lifestyle   Physical activity:    Days per week: Not on file    Minutes per session: Not on file   Stress: Not on file  Relationships   Social connections:    Talks on phone: Not on file    Gets together: Not on file    Attends religious service: Not on file    Active member of club or organization: Not on file    Attends meetings of clubs or organizations: Not on file    Relationship status: Not on file   Intimate partner violence:    Fear of  current or ex partner: Not on file    Emotionally abused: Not on file    Physically abused: Not on file    Forced  sexual activity: Not on file  Other Topics Concern   Not on file  Social History Narrative   Not on file    Family History:    Family History  Problem Relation Age of Onset   Heart failure Mother    Lung cancer Mother    Lung cancer Father    Heart attack Maternal Grandmother    Heart attack Maternal Grandfather      ROS:  Please see the history of present illness.  Review of Systems  Constitutional: Positive for malaise/fatigue.  Respiratory: Positive for shortness of breath and wheezing.   Cardiovascular: Positive for chest pain, palpitations, orthopnea and leg swelling.       CP improving since yesterday 6/1. Feels like chest tightness, pleuritic, and occurs in the past with volume overload per patient.  Gastrointestinal: Negative for melena.  Genitourinary: Negative for flank pain.       No flank pain but rather lower left rib pain made worse with labored breathing  Neurological: Positive for dizziness.  All other systems reviewed and are negative.   All other ROS reviewed and negative.     Physical Exam/Data:   Vitals:   07/18/18 0745 07/18/18 0800 07/18/18 0815 07/18/18 0909  BP:  139/73  (!) 145/93  Pulse: (!) 50 66 (!) 58 73  Resp: Temp:      TempSrc:      SpO2: 98% 98% 96% 100%  Weight:      Height:        Intake/Output Summary (Last 24 hours) at 07/18/2018 0922 Last data filed at 07/18/2018 0913 Gross per 24 hour  Intake --  Output 1700 ml  Net -1700 ml   Filed Weights   07/17/18 2330  Weight: (!) 178.3 kg   Body mass index is 59.77 kg/m.  General:  Obese male in no acute dystress HEENT: normal Neck: JVD difficult to assess d/t body habitus Cardiac:  Distant heart sounds, IRIR; no murmur appreciated  Lungs: Distant lung sounds, trace wheezing, reduced bibasilar breath sounds  Abd: obese, nontender, no  hepatomegaly  Ext: 1+ bilateral LEE, bilateral lower extremities wrapped in ACE bandages Musculoskeletal:  No deformities, b/l lower extremities wrapped Skin: warm and dry  Neuro: no focal abnormalities noted Psych:  Normal affect   EKG:  The EKG was personally reviewed and demonstrates:  Afib, known LBBB, 83bpm ventricular rate Telemetry:  Telemetry was personally reviewed and demonstrates:  IRIR, ventricular rates 38-70s  Relevant CV Studies:  TTE 03/24/2018  1. The left ventricle has severely reduced systolic function of 25-30%. The cavity size was mildly increased. There is no increased left ventricular wall thickness. Left ventricular diastology could not be evaluated due to nondiagnostic images. Left  ventricular diffuse hypokinesis.  2. Left atrial size was moderately dilated.  3. Not assessed.  4. The mitral valve was not assessed. There is mild thickening and mild calcification. Mitral valve regurgitation was not significant.  5. The tricuspid valve is not assessed. Tricuspid valve regurgitation was not assessed by color flow Doppler.  6. The aortic valve was not assessed.  7. The interatrial septum was not well visualized.  Laboratory Data:  Chemistry Recent Labs  Lab 07/17/18 2333  NA 137  K 4.0  CL 105  CO2 23  GLUCOSE 119*  BUN 15  CREATININE 0.95  CALCIUM 8.2*  GFRNONAA >60  GFRAA >60  ANIONGAP 9    Recent Labs  Lab 07/17/18 2333  PROT 7.0  ALBUMIN 3.5  AST 20  ALT 10  ALKPHOS 78  BILITOT 0.7   Hematology Recent Labs  Lab 07/17/18 2333  WBC 5.9  RBC 4.03*  HGB 10.0*  HCT 32.6*  MCV 80.9  MCH 24.8*  MCHC 30.7  RDW 19.9*  PLT 266   Cardiac Enzymes Recent Labs  Lab 07/17/18 2333 07/18/18 0145 07/18/18 0614  TROPONINI 0.03* 0.04* 0.03*   No results for input(s): TROPIPOC in the last 168 hours.  BNPNo results for input(s): BNP, PROBNP in the last 168 hours.  DDimer No results for input(s): DDIMER in the last 168  hours.  Radiology/Studies:  Dg Chest Port 1 View  Result Date: 07/18/2018 CLINICAL DATA:  Left-sided chest pain EXAM: PORTABLE CHEST 1 VIEW COMPARISON:  05/29/2018 FINDINGS: The cardiac silhouette is enlarged. There is mild volume overload. No pneumothorax. No large pleural effusion. No acute osseous abnormality. IMPRESSION: Cardiomegaly with mild volume overload. Electronically Signed   By: Katherine Mantlehristopher  Green M.D.   On: 07/18/2018 01:41    Assessment and Plan:   Acute on chronic systolic heart failure (EF 25-30%) - Appears volume overloaded on exam although somewhat difficult to assess volume statis in the setting of morbid obesity. Continued LEE thought reportedly improved with Unna boots (not currently placed on patient). As previously noted, patient takes spironolactone and torsemide by alternating every other day, rather than taking both daily as prescribed, which may at least in part be contributing to his symptoms. Consider that SOB is also likely multifactorial given known OSA and COPD with Afib in setting of HFrEF.  - EF per 03/2018 echo 25-30% and NICM presumed d/t reportedly 2 negative catheterizations in the past, though would be helpful to also obtain outside records if possible to confirm these findings.  - Started on IV diuresis in the ED with -1.7L net output. Weight recorded as 393lbs with baseline weight 390lbs. Continue IV diuresis as tolerated and titrate as needed for output with close monitoring of vitals and renal function / daily BMET, given earlier hypotension. Will order BNP. Continue to monitor I's/O daily standing weights.  Replete electrolytes as needed with goal K 4.0, Mg 2.0 . -Continue spironolactone, lisinopril, and metoprolol as BP allows. Metoprolol succinate will need hold parameters in place for bradycardia and hypotension.  --Currently not an ideal candidate for restart of evidence based Entresto, given medication noncompliance in past, hypotension earlier, and as  will need ACE washout.  In the future and once outpatient, consider restart of Entresto after ACE washout and with more stable BP (once able to demonstrate medication compliance).  -- Dietary changes recommended, as well as future medication and CHF clinic appointment compliance. Could also consider repeat cardioversion this admission, as below, given patient reports anticoagulation compliance. Follow-up echo recommended if restoration of SR.   Atypical Chronic Chest Pain Troponin elevation, Likely supply demand ischemia - Chronic vague atypical chest discomfort / tightness on review of past documentation. Current left sided, non-radiating, and pleuritic chest tightness, which patient stated occurs in the past with volume overload. 2 negative caths in the past.  -Troponin 0.03  0.04  0.03 and trace elevation before down-trending. Suspicion for supply demand ischemia in the setting of volume overload and persistent atrial fibrillation. --Continue medical management with hold parameters for BB. Continue spiro, ACE. Continue statin with last LDL 06/2018 70 and at goal.  - No plan for emergent invasive ischemic evaluation at this time; however, with continued chest pain, could consider outpatient ischemic evaluation and pending receipt  of negative past caths.  Persistent atrial fibrillation -Remains in Afib with palpitations with exertion, SOB/DOE, and intermittent dizziness noted while in Afib.  Previous cardioversion in 03/2010. Plan was for outpatient cardioversion before this admission (at 05/2018 f/u). - CHA2DS2VASc score of at least 4 (CHF, HTN, DM2, h/o PE,vascular).  --Rate controlled on review of telemetry. -Continue medical management with amiodarone, Xarelto, and BB as above.  --Possible DCCV this admission as he is reportedly compliant with Xarelto.   Anemia - Hgb 10.0, though stable from previous admission.  - At most recent telehealth visit, it was recommend he minimize his aspirin use (BC  powder) due to past reported BRBPR on Eliquis.  --Continue PTA iron supplementation. -- Daily CBC recommended to continue to monitor for any sudden drops in Hgb. Transfusion recommended below 8.0. Further workup for acute bleed recommended if drops in Hgb and/or continued reports of BRBPR.  DM2 -- SSI, per IM -- Will recheck A1C. - last checked 02/2018 and 6.9.  Hypothyroid -- TSH borderline as above - f/u with PCP recommended. -- Continue synthroid.  Morbid obesity - Lifestyle changes strongly encouraged.    For questions or updates, please contact [facility] Please consult [email or web address] for contact info under     Signed, Lennon Alstrom, PA-C  07/18/2018 9:22 AM

## 2018-07-19 ENCOUNTER — Telehealth: Payer: Self-pay

## 2018-07-19 DIAGNOSIS — R079 Chest pain, unspecified: Secondary | ICD-10-CM

## 2018-07-19 DIAGNOSIS — J441 Chronic obstructive pulmonary disease with (acute) exacerbation: Secondary | ICD-10-CM

## 2018-07-19 DIAGNOSIS — I4819 Other persistent atrial fibrillation: Secondary | ICD-10-CM

## 2018-07-19 DIAGNOSIS — J9601 Acute respiratory failure with hypoxia: Secondary | ICD-10-CM

## 2018-07-19 DIAGNOSIS — I509 Heart failure, unspecified: Secondary | ICD-10-CM

## 2018-07-19 LAB — BASIC METABOLIC PANEL
Anion gap: 8 (ref 5–15)
BUN: 25 mg/dL — ABNORMAL HIGH (ref 6–20)
CO2: 24 mmol/L (ref 22–32)
Calcium: 8.6 mg/dL — ABNORMAL LOW (ref 8.9–10.3)
Chloride: 101 mmol/L (ref 98–111)
Creatinine, Ser: 1.05 mg/dL (ref 0.61–1.24)
GFR calc Af Amer: >60 mL/min (ref >60)
GFR calc non Af Amer: >60 mL/min (ref >60)
Glucose, Bld: 147 mg/dL — ABNORMAL HIGH (ref 70–99)
Potassium: 4.8 mmol/L (ref 3.5–5.1)
Sodium: 133 mmol/L — ABNORMAL LOW (ref 135–145)

## 2018-07-19 LAB — GLUCOSE, CAPILLARY
Glucose-Capillary: 130 mg/dL — ABNORMAL HIGH (ref 70–99)
Glucose-Capillary: 100 mg/dL — ABNORMAL HIGH (ref 70–99)
Glucose-Capillary: 107 mg/dL — ABNORMAL HIGH (ref 70–99)
Glucose-Capillary: 95 mg/dL (ref 70–99)

## 2018-07-19 MED ORDER — MELOXICAM 7.5 MG PO TABS
15.0000 mg | ORAL_TABLET | Freq: Every day | ORAL | Status: DC
  Administered 2018-07-19 – 2018-07-23 (×5): 15 mg via ORAL
  Filled 2018-07-19 (×5): qty 2

## 2018-07-19 MED ORDER — ACETAMINOPHEN 325 MG PO TABS
650.0000 mg | ORAL_TABLET | Freq: Four times a day (QID) | ORAL | Status: DC | PRN
  Administered 2018-07-22: 650 mg via ORAL
  Filled 2018-07-19: qty 2

## 2018-07-19 MED ORDER — ATORVASTATIN CALCIUM 20 MG PO TABS
40.0000 mg | ORAL_TABLET | Freq: Every day | ORAL | Status: DC
  Administered 2018-07-19 – 2018-07-22 (×4): 40 mg via ORAL
  Filled 2018-07-19 (×4): qty 2

## 2018-07-19 MED ORDER — KETOTIFEN FUMARATE 0.025 % OP SOLN
1.0000 [drp] | Freq: Two times a day (BID) | OPHTHALMIC | Status: DC
  Administered 2018-07-19 – 2018-07-23 (×8): 1 [drp] via OPHTHALMIC
  Filled 2018-07-19: qty 5

## 2018-07-19 MED ORDER — ACETAMINOPHEN 650 MG RE SUPP: 650.0000 mg | Freq: Four times a day (QID) | RECTAL | Status: DC | PRN

## 2018-07-19 MED ORDER — NITROGLYCERIN 0.4 MG SL SUBL: 0.4000 mg | SUBLINGUAL_TABLET | SUBLINGUAL | Status: DC | PRN

## 2018-07-19 MED ORDER — FERROUS SULFATE 325 (65 FE) MG PO TABS
325.0000 mg | ORAL_TABLET | Freq: Two times a day (BID) | ORAL | Status: DC
  Administered 2018-07-19 – 2018-07-23 (×9): 325 mg via ORAL
  Filled 2018-07-19 (×9): qty 1

## 2018-07-19 MED ORDER — LISINOPRIL 5 MG PO TABS
2.5000 mg | ORAL_TABLET | Freq: Every day | ORAL | Status: DC
  Administered 2018-07-19 – 2018-07-21 (×3): 2.5 mg via ORAL
  Filled 2018-07-19 (×3): qty 1

## 2018-07-19 MED ORDER — AMIODARONE HCL 200 MG PO TABS: 200.0000 mg | ORAL_TABLET | Freq: Every day | ORAL | Status: DC

## 2018-07-19 MED ORDER — TRAZODONE HCL 50 MG PO TABS
50.0000 mg | ORAL_TABLET | Freq: Every evening | ORAL | Status: DC | PRN
  Administered 2018-07-19: 50 mg via ORAL
  Filled 2018-07-19: qty 1

## 2018-07-19 MED ORDER — GABAPENTIN 300 MG PO CAPS
300.0000 mg | ORAL_CAPSULE | Freq: Three times a day (TID) | ORAL | Status: DC
  Administered 2018-07-19 – 2018-07-23 (×13): 300 mg via ORAL
  Filled 2018-07-19 (×13): qty 1

## 2018-07-19 MED ORDER — VITAMIN B-1 100 MG PO TABS
250.0000 mg | ORAL_TABLET | Freq: Every day | ORAL | Status: DC
  Administered 2018-07-19 – 2018-07-23 (×5): 250 mg via ORAL
  Filled 2018-07-19 (×5): qty 3

## 2018-07-19 MED ORDER — LEVOTHYROXINE SODIUM 25 MCG PO TABS
25.0000 ug | ORAL_TABLET | Freq: Every day | ORAL | Status: DC
  Administered 2018-07-19 – 2018-07-23 (×5): 25 ug via ORAL
  Filled 2018-07-19 (×5): qty 1

## 2018-07-19 MED ORDER — ALLOPURINOL 100 MG PO TABS
100.0000 mg | ORAL_TABLET | Freq: Every day | ORAL | Status: DC
  Administered 2018-07-19 – 2018-07-23 (×5): 100 mg via ORAL
  Filled 2018-07-19 (×5): qty 1

## 2018-07-19 MED ORDER — TRAZODONE HCL 50 MG PO TABS
150.0000 mg | ORAL_TABLET | Freq: Every evening | ORAL | Status: DC | PRN
  Administered 2018-07-20 – 2018-07-22 (×4): 150 mg via ORAL
  Filled 2018-07-19 (×4): qty 1

## 2018-07-19 MED ORDER — POTASSIUM CHLORIDE CRYS ER 20 MEQ PO TBCR: 20.0000 meq | EXTENDED_RELEASE_TABLET | Freq: Two times a day (BID) | ORAL | Status: DC

## 2018-07-19 MED ORDER — SPIRONOLACTONE 25 MG PO TABS
25.0000 mg | ORAL_TABLET | Freq: Every day | ORAL | Status: DC
  Administered 2018-07-19 – 2018-07-21 (×3): 25 mg via ORAL
  Filled 2018-07-19 (×3): qty 1

## 2018-07-19 MED ORDER — METOPROLOL SUCCINATE ER 50 MG PO TB24
50.0000 mg | ORAL_TABLET | Freq: Two times a day (BID) | ORAL | Status: DC
  Administered 2018-07-19 – 2018-07-22 (×7): 50 mg via ORAL
  Filled 2018-07-19 (×8): qty 1

## 2018-07-19 MED ORDER — POLYVINYL ALCOHOL 1.4 % OP SOLN
1.0000 [drp] | OPHTHALMIC | Status: DC | PRN
  Filled 2018-07-19: qty 15

## 2018-07-19 MED ORDER — LORATADINE 10 MG PO TABS
10.0000 mg | ORAL_TABLET | Freq: Every day | ORAL | Status: DC
  Administered 2018-07-19 – 2018-07-23 (×5): 10 mg via ORAL
  Filled 2018-07-19 (×5): qty 1

## 2018-07-19 MED ORDER — FLUTICASONE PROPIONATE 50 MCG/ACT NA SUSP
2.0000 | Freq: Two times a day (BID) | NASAL | Status: DC
  Administered 2018-07-19 – 2018-07-23 (×9): 2 via NASAL
  Filled 2018-07-19: qty 16

## 2018-07-19 NOTE — Progress Notes (Signed)
Patient has been in a slow ventricular rate. Heart rate has been in the 40's and 50's. Patient had three 2 second pauses throughout the shift, notified Dr. Sheryle Hail of current events. No new orders were given. Will alert oncoming nurse to notify cardiologist about pauses.

## 2018-07-19 NOTE — Progress Notes (Signed)
Progress Note  Patient Name: [name] [name] Date of Encounter: 07/19/2018  Primary Cardiologist:   Subjective   Good urine output, still with significant leg swelling, bloating, shortness of breath Some medication noncompliance, dietary noncompliance Lives alone, depressed, lost his wife several months ago  Inpatient Medications    Scheduled Meds: . allopurinol  100 mg Oral Daily  . amiodarone  200 mg Oral Daily  . atorvastatin  40 mg Oral q1800  . docusate sodium  100 mg Oral BID  . ferrous sulfate  325 mg Oral BID WC  . fluticasone  2 spray Each Nare BID  . furosemide  40 mg Intravenous BID  . gabapentin  300 mg Oral TID  . insulin aspart  0-15 Units Subcutaneous TID WC  . insulin aspart  0-5 Units Subcutaneous QHS  . ketotifen  1 drop Both Eyes BID  . levothyroxine  25 mcg Oral QAC breakfast  . lisinopril  2.5 mg Oral Daily  . loratadine  10 mg Oral Daily  . meloxicam  15 mg Oral Daily  . metoprolol succinate  50 mg Oral BID  . mometasone-formoterol  2 puff Inhalation BID  . rivaroxaban  20 mg Oral Q supper  . sodium chloride flush  3 mL Intravenous Q12H  . spironolactone  25 mg Oral Daily  . thiamine  250 mg Oral Daily   Continuous Infusions:  PRN Meds: acetaminophen **OR** acetaminophen, albuterol, morphine injection, nitroGLYCERIN, ondansetron **OR** ondansetron (ZOFRAN) IV, polyvinyl alcohol, sodium chloride flush, traZODone   Vital Signs    Vitals:   07/18/18 1950 07/18/18 2002 07/19/18 0507 07/19/18 0727  BP: (!) 103/55 118/66 (!) 105/54 130/70  Pulse: 63 64 (!) 52 68  Resp:   20 (!) 22  Temp:  98.7 F (37.1 C) 98.3 F (36.8 C)   TempSrc:  Oral    SpO2:  98% 99% 98%  Weight:   (!) 182 kg   Height:        Intake/Output Summary (Last 24 hours) at 07/19/2018 1016 Last data filed at 07/19/2018 4132 Gross per 24 hour  Intake 240 ml  Output 1100 ml  Net -860 ml   Last 3 Weights 07/19/2018 07/18/2018 07/17/2018  Weight (lbs) 401 lb 3.2 oz 400 lb 3.2 oz  393 lb 1.3 oz  Weight (kg) 181.983 kg 181.53 kg 178.3 kg      Telemetry    Atrial fibrillation- Personally Reviewed  ECG    - Personally Reviewed  Physical Exam  Morbid obesity GEN: No acute distress.   Neck: No JVD Cardiac:  Irregularly irregular no murmurs, rubs, or gallops.  Leg wraps in place Respiratory: Clear to auscultation bilaterally. GI: Soft, nontender, non-distended  MS: No edema; No deformity. Neuro:  Nonfocal  Psych: Normal affect   Labs    Chemistry Recent Labs  Lab 07/17/18 2333 07/19/18 0404  NA 137 133*  K 4.0 4.8  CL 105 101  CO2 23 24  GLUCOSE 119* 147*  BUN 15 25*  CREATININE 0.95 1.05  CALCIUM 8.2* 8.6*  PROT 7.0  --   ALBUMIN 3.5  --   AST 20  --   ALT 10  --   ALKPHOS 78  --   BILITOT 0.7  --   GFRNONAA >60 >60  GFRAA >60 >60  ANIONGAP 9 8     Hematology Recent Labs  Lab 07/17/18 2333  WBC 5.9  RBC 4.03*  HGB 10.0*  HCT 32.6*  MCV 80.9  MCH 24.8*  MCHC 30.7  RDW 19.9*  PLT 266    Cardiac Enzymes Recent Labs  Lab 07/17/18 2333 07/18/18 0145 07/18/18 0614  TROPONINI 0.03* 0.04* 0.03*   No results for input(s): TROPIPOC in the last 168 hours.   BNP Recent Labs  Lab 07/17/18 2333  BNP 290.0*     DDimer No results for input(s): DDIMER in the last 168 hours.   Radiology    Dg Chest Port 1 View  Result Date: 07/18/2018 CLINICAL DATA:  Left-sided chest pain EXAM: PORTABLE CHEST 1 VIEW COMPARISON:  05/29/2018 FINDINGS: The cardiac silhouette is enlarged. There is mild volume overload. No pneumothorax. No large pleural effusion. No acute osseous abnormality. IMPRESSION: Cardiomegaly with mild volume overload. Electronically Signed   By: Katherine Mantle M.D.   On: 07/18/2018 01:41    Cardiac Studies   Echo 03/2018  1. The left ventricle has severely reduced systolic function of 25-30%. The cavity size was mildly increased. There is no increased left ventricular wall thickness. Left ventricular diastology could  not be evaluated due to nondiagnostic images. Left  ventricular diffuse hypokinesis.  2. Left atrial size was moderately dilated.  3. Not assessed.  4. The mitral valve was not assessed. There is mild thickening and mild calcification. Mitral valve regurgitation was not significant.  5. The tricuspid valve is not assessed. Tricuspid valve regurgitation was not assessed by color flow Doppler.  6. The aortic valve was not assessed.  7. The interatrial septum was not well visualized.   Patient Profile     [name] [name] is a 58 y.o. male with a hx of chronic systolic heart failure (EF 25-30%), persistent atrial fibrillation s/p cardioversion (03/2018), OSA, COPD, DM2, and morbid obesity, who is being seen today for the evaluation of acute on chronic heart failure and atypical CP   Assessment & Plan    Acute on chronic systolic heart failure (EF 25-30%)  multifactorial given known OSA and COPD with Afib in setting of HFrEF.  - EF per 03/2018 echo 25-30% and NICM   Continue IV diuresis lasix BID -Continue spironolactone, lisinopril, and metoprolol  --Currently not an ideal candidate for restart of evidence based Entresto, given medication noncompliance in past -- Dietary changes recommended,  CHF clinic  paramedicine  Atypical Chronic Chest Pain  Likely supply demand ischemia -Troponin 0.03  0.04  0.03 No plan for ischemic w/u  Persistent atrial fibrillation -Remains in Afib Previous cardioversion in 03/2010.  Plan was for outpatient cardioversion before this admission (at 05/2018 f/u). - CHA2DS2VASc score of at least 4 (CHF, HTN, DM2, h/o PE,vascular).  -Continue amiodarone, Xarelto, and BB   Anemia - Hgb 10.0,  stable   DM2 -- SSI, per IM A1C. -02/2018  6.9.  Hypothyroid -- TSH borderline as above - f/u with PCP recommended. -- Continue synthroid.  Morbid obesity - Lifestyle changes strongly encouraged. Need dietary consult  Pauses Overnight in the setting of sleep  apnea No further intervention needed   Total encounter time more than 25 minutes  Greater than 50% was spent in counseling and coordination of care with the patient   For questions or updates, please contact [facility] Please consult [email or web address] for contact info under        Signed, Julien Nordmann, MD  07/19/2018, 10:16 AM

## 2018-07-19 NOTE — Consult Note (Signed)
WOC Nurse wound consult note Reason for Consult: chronic, nonhealing venous leg ulcers. Clinical CEAP 6. Uses zinc paste (Unna's Boots) bandages at home applied by [facility]. Seen by my partner, K. Sanders in April of this year. Wound type: Venous insufficiency Pressure Injury POA: NA Measurement: Right lateral LE:  1.4cm x 2.2cm x 0.2cm Left lateral LE: 2.5cm x 3.8cm x 0.2cm Wound bed: red with dried serum (scab) in areas (does not obscure depth) Drainage (amount, consistency, odor)  Periwound: intact with bowling pin edema, hemosiderin staining. Dressing procedure/placement/frequency: As LEs were quite edematous upon admission, Unna's boots are not appropriate at this time. Instead, I will implement twice-daly changes with xeroform as a wound contact layer and a dry boot to accommodate for the changing leg circumference until discharge.  He may resume Unna's Boot changes with HHRN once home.  WOC nursing team will not follow, but will remain available to this patient, the nursing and medical teams.  Please re-consult if needed. Thanks, Ladona Mow, MSN, RN, GNP, [place]  Pager# [phone]

## 2018-07-19 NOTE — Progress Notes (Signed)
[facility] - [place] at [facility]   PATIENT NAME: [name] [name]    MR#:  [id number]  DATE OF BIRTH:  November 22, 1960  SUBJECTIVE:  CHIEF COMPLAINT:   Chief Complaint  Patient presents with  . Chest Pain  Patient seen and evaluated today Shortness of breath improving Swelling in the legs No chest pain No fever  REVIEW OF SYSTEMS:    ROS  CONSTITUTIONAL: No documented fever. No fatigue, weakness. No weight gain, no weight loss.  EYES: No blurry or double vision.  ENT: No tinnitus. No postnasal drip. No redness of the oropharynx.  RESPIRATORY: No cough, no wheeze, no hemoptysis. Has dyspnea.  CARDIOVASCULAR: No chest pain. Has orthopnea. No palpitations. No syncope.  GASTROINTESTINAL: No nausea, no vomiting or diarrhea. No abdominal pain. No melena or hematochezia.  GENITOURINARY: No dysuria or hematuria.  ENDOCRINE: No polyuria or nocturia. No heat or cold intolerance.  HEMATOLOGY: No anemia. No bruising. No bleeding.  INTEGUMENTARY: No rashes. No lesions.  MUSCULOSKELETAL: No arthritis. Has swelling. No gout.  NEUROLOGIC: No numbness, tingling, or ataxia. No seizure-type activity.  PSYCHIATRIC: No anxiety. No insomnia. No ADD.   DRUG ALLERGIES:  No Known Allergies  VITALS:  Blood pressure 130/70, pulse 68, temperature 98.3 F (36.8 C), resp. rate (!) 22, height 5\' 8"  (1.727 m), weight (!) 182 kg, SpO2 98 %.  PHYSICAL EXAMINATION:   Physical Exam  GENERAL:  58 y.o.-year-old patient lying in the bed with no acute distress.  EYES: Pupils equal, round, reactive to light and accommodation. No scleral icterus. Extraocular muscles intact.  HEENT: Head atraumatic, normocephalic. Oropharynx and nasopharynx clear.  NECK:  Supple, no jugular venous distention. No thyroid enlargement, no tenderness.  LUNGS: Decreased breath sounds bilaterally, bibasilar crepitations heard. CARDIOVASCULAR: S1, S2 normal. No murmurs, rubs, or gallops.  ABDOMEN: Soft, nontender,  nondistended. Bowel sounds present. No organomegaly or mass.  EXTREMITIES: No cyanosis, No clubbing bilateral edema b/l.    NEUROLOGIC: Cranial nerves II through XII are intact. No focal Motor or sensory deficits b/l.   PSYCHIATRIC: The patient is alert and oriented x 3.  SKIN: No obvious rash, lesion, or ulcer.   LABORATORY PANEL:   CBC Recent Labs  Lab 07/17/18 2333  WBC 5.9  HGB 10.0*  HCT 32.6*  PLT 266   ------------------------------------------------------------------------------------------------------------------ Chemistries  Recent Labs  Lab 07/17/18 2333 07/19/18 0404  NA 137 133*  K 4.0 4.8  CL 105 101  CO2 23 24  GLUCOSE 119* 147*  BUN 15 25*  CREATININE 0.95 1.05  CALCIUM 8.2* 8.6*  AST 20  --   ALT 10  --   ALKPHOS 78  --   BILITOT 0.7  --    ------------------------------------------------------------------------------------------------------------------  Cardiac Enzymes Recent Labs  Lab 07/18/18 0614  TROPONINI 0.03*   ------------------------------------------------------------------------------------------------------------------  RADIOLOGY:  Dg Chest Port 1 View  Result Date: 07/18/2018 CLINICAL DATA:  Left-sided chest pain EXAM: PORTABLE CHEST 1 VIEW COMPARISON:  05/29/2018 FINDINGS: The cardiac silhouette is enlarged. There is mild volume overload. No pneumothorax. No large pleural effusion. No acute osseous abnormality. IMPRESSION: Cardiomegaly with mild volume overload. Electronically Signed   By: Katherine Mantle M.D.   On: 07/18/2018 01:41     ASSESSMENT AND PLAN:   58 year old male patient always with history of congestive heart failure, atrial fibrillation, sleep apnea, COPD currently under hospitalist service for shortness of breath  -Acute on chronic systolic heart failure exacerbation Improving slowly EF of 25 to 30% from echo in February 2020  Continue diuresis with Lasix and Aldactone Continue ACE inhibitor and  beta-blocker Not a candidate for Entresto given history of noncompliance Monitor renal function Cardiology consult follow-up Monitor input output chart and daily body weights  -Chronic atrial fibrillation Rate controlled with metoprolol and amiodarone Continue Xarelto for anticoagulation  -Hypothyroidism Continue Synthroid  -Hypertension Continue ACE inhibitor  -Obesity Healthy diet and exercise consult  -DVT prophylaxis On anticoagulation with Xarelto  All the records are reviewed and case discussed with Care Management/Social Worker. Management plans discussed with the patient, family and they are in agreement.  CODE STATUS: Full code  DVT Prophylaxis: SCDs  TOTAL TIME TAKING CARE OF THIS PATIENT: 35 minutes.   POSSIBLE D/C IN 2 to 3  DAYS, DEPENDING ON CLINICAL CONDITION.  Ihor [name] M.D on 07/19/2018 at 12:38 PM  Between 7am to 6pm - Pager - [phone]  After 6pm go to [email or web address] - password [email or web address] [employer]  [facility] [place] [facility]  Office  [phone]  CC: Primary care physician; Dorcas Carrow, DO  Note: This dictation was prepared with Dragon dictation along with smaller phrase technology. Any transcriptional errors that result from this process are unintentional.

## 2018-07-20 LAB — GLUCOSE, CAPILLARY
Glucose-Capillary: 91 mg/dL (ref 70–99)
Glucose-Capillary: 101 mg/dL — ABNORMAL HIGH (ref 70–99)
Glucose-Capillary: 128 mg/dL — ABNORMAL HIGH (ref 70–99)
Glucose-Capillary: 107 mg/dL — ABNORMAL HIGH (ref 70–99)

## 2018-07-20 LAB — BASIC METABOLIC PANEL
Anion gap: 10 (ref 5–15)
BUN: 35 mg/dL — ABNORMAL HIGH (ref 6–20)
CO2: 25 mmol/L (ref 22–32)
Calcium: 9 mg/dL (ref 8.9–10.3)
Chloride: 100 mmol/L (ref 98–111)
Creatinine, Ser: 1.26 mg/dL — ABNORMAL HIGH (ref 0.61–1.24)
GFR calc Af Amer: >60 mL/min (ref >60)
GFR calc non Af Amer: >60 mL/min (ref >60)
Glucose, Bld: 101 mg/dL — ABNORMAL HIGH (ref 70–99)
Potassium: 4.7 mmol/L (ref 3.5–5.1)
Sodium: 135 mmol/L (ref 135–145)

## 2018-07-20 LAB — HEMOGLOBIN A1C
Hgb A1c MFr Bld: 5.8 % — ABNORMAL HIGH (ref 4.8–5.6)
Mean Plasma Glucose: 119.76 mg/dL

## 2018-07-20 MED ORDER — ENSURE MAX PROTEIN PO LIQD
11.0000 [oz_av] | Freq: Two times a day (BID) | ORAL | Status: DC
  Administered 2018-07-20 – 2018-07-23 (×6): 11 [oz_av] via ORAL
  Filled 2018-07-20: qty 330

## 2018-07-20 MED ORDER — OXYCODONE-ACETAMINOPHEN 5-325 MG PO TABS
1.0000 | ORAL_TABLET | Freq: Four times a day (QID) | ORAL | Status: DC | PRN
  Administered 2018-07-20 – 2018-07-22 (×7): 2 via ORAL
  Filled 2018-07-20 (×7): qty 2

## 2018-07-20 MED ORDER — VITAMIN C 500 MG PO TABS
250.0000 mg | ORAL_TABLET | Freq: Two times a day (BID) | ORAL | Status: DC
  Administered 2018-07-20 – 2018-07-23 (×6): 250 mg via ORAL
  Filled 2018-07-20 (×6): qty 1

## 2018-07-20 MED ORDER — CYCLOBENZAPRINE HCL 10 MG PO TABS
5.0000 mg | ORAL_TABLET | Freq: Three times a day (TID) | ORAL | Status: DC | PRN
  Administered 2018-07-20 – 2018-07-22 (×6): 5 mg via ORAL
  Filled 2018-07-20 (×6): qty 1

## 2018-07-20 MED ORDER — OXYCODONE-ACETAMINOPHEN 5-325 MG PO TABS
1.0000 | ORAL_TABLET | Freq: Four times a day (QID) | ORAL | Status: DC | PRN
  Administered 2018-07-20: 1 via ORAL
  Filled 2018-07-20: qty 1

## 2018-07-20 MED ORDER — ADULT MULTIVITAMIN W/MINERALS CH
1.0000 | ORAL_TABLET | Freq: Every day | ORAL | Status: DC
  Administered 2018-07-20 – 2018-07-23 (×4): 1 via ORAL
  Filled 2018-07-20 (×3): qty 1

## 2018-07-20 NOTE — Plan of Care (Signed)
Nutrition Education Note  RD consulted for nutrition education regarding CHF.  RD provided "Low Sodium Nutrition Therapy" handout from the Academy of Nutrition and Dietetics via mail. Reviewed patient's dietary recall. Provided examples on ways to decrease sodium intake in diet. Discouraged intake of processed foods and use of salt shaker. Encouraged fresh fruits and vegetables as well as whole grain sources of carbohydrates to maximize fiber intake.   RD discussed why it is important for patient to adhere to diet recommendations, and emphasized the role of fluids, foods to avoid, and importance of weighing self daily. Teach back method used.  Expect poor compliance.  Body mass index is 60.74 kg/m. Pt meets criteria for morbid obesity based on current BMI.  Current diet order is HH, patient is consuming approximately 75-100% of meals at this time.   RD following this patient   Betsey Holiday MS, RD, LDN Pager #- [phone] Office#- [phone] After Hours Pager: [phone]

## 2018-07-20 NOTE — TOC Initial Note (Signed)
Transition of Care [facility]) - Initial/Assessment Note    Patient Details  Name: [name] [name] MRN: [id number] Date of Birth: 1960-09-17  Transition of Care [facility]) CM/SW Contact:    Sherren Kerns, RN Phone Number: 07/20/2018, 3:50 PM  Clinical Narrative:      Patient is from home alone.  He has had multiple readmissions.  Acute on chronic CHF.  Current with PCP; obtains medications at [facility] on [address]  He has a very old, used walker at home.  Referral to [facility] with [employer] for a new rolling walker with seat.  Open to [facility] for home health services.  Would like to continue with them-adding SW long with RN, PT, OT and aide.  He has bilateral wounds to lower extremities.  He has difficulty standing when at home.  Requesting additional services.  Faxed application for [facility] to [facility].  Provided patient with a copy.  He has a scale at home but does not weigh often.  Educated patient on importance of daily weights.  His girlfriend passed away a few moths ago and he has had a difficult time dealing with that.  Will notify Rosey Bath with [facility] when patient discharges.               Expected Discharge Plan: Home w Home Health Services Barriers to Discharge: Continued Medical Work up   Patient Goals and CMS Choice Patient states their goals for this hospitalization and ongoing recovery are:: return home with home health and PCS CMS Medicare.gov Compare Post Acute Care list provided to:: Patient Choice offered to / list presented to : Patient  Expected Discharge Plan and Services Expected Discharge Plan: Home w Home Health Services   Discharge Planning Services: CM Consult   Living arrangements for the past 2 months: Apartment                 DME Arranged: Walker rolling with seat DME Agency: [employer] Date DME Agency Contacted: 07/20/18 Time DME Agency Contacted: 1549   HH Arranged: RN, PT, OT, Nurse's Aide, Social Work [employer] Agency: [employer] (now [facility]) Date  HH Agency Contacted: 07/20/18 Time HH Agency Contacted: 1549 Representative spoke with at [facility] Agency: Rosey Bath  Prior Living Arrangements/Services Living arrangements for the past 2 months: Apartment Lives with:: Self Patient language and need for interpreter reviewed:: Yes Do you feel safe going back to the place where you live?: Yes            Criminal Activity/Legal Involvement Pertinent to Current Situation/Hospitalization: No - Comment as needed  Activities of Daily Living Home Assistive Devices/Equipment: Environmental consultant (specify type) ADL Screening (condition at time of admission) Patient's cognitive ability adequate to safely complete daily activities?: Yes Is the patient deaf or have difficulty hearing?: No Does the patient have difficulty seeing, even when wearing glasses/contacts?: No Does the patient have difficulty concentrating, remembering, or making decisions?: No Patient able to express need for assistance with ADLs?: Yes Does the patient have difficulty dressing or bathing?: No Independently performs ADLs?: Yes (appropriate for developmental age) Does the patient have difficulty walking or climbing stairs?: Yes Weakness of Legs: Both Weakness of Arms/Hands: Both  Permission Sought/Granted Permission sought to share information with : Facility Industrial/product designer granted to share information with : Yes, Verbal Permission Granted     Permission granted to share info w AGENCY: [facility]        Emotional Assessment Appearance:: Appears stated age Attitude/Demeanor/Rapport: Gracious Affect (typically observed): Accepting Orientation: :  Oriented to Self, Oriented to Place, Oriented to  Time, Oriented to Situation Alcohol / Substance Use: Not Applicable    Admission diagnosis:  COPD exacerbation (HCC) [J44.1] Acute respiratory failure with hypoxia (HCC) [J96.01] Chest pain [R07.9] Acute on chronic congestive heart failure, unspecified heart failure type [facility])  [I50.9] Patient Active Problem List   Diagnosis Date Noted  . Acute on chronic systolic CHF (congestive heart failure) (HCC) 07/18/2018  . Chronic atrial fibrillation   . Personal history of DVT (deep vein thrombosis) 07/13/2018  . History of pulmonary embolism (on Xarelto) 07/13/2018  . Neck pain 07/13/2018  . Thoracic spine pain 07/13/2018  . Chronic bilateral low back pain with bilateral sciatica 07/13/2018  . Chronic pain of both knees 07/13/2018  . TBI (traumatic brain injury) (HCC) 07/04/2018  . Anemia 06/27/2018  . Abnormal thyroid blood test 06/27/2018  . Diet-controlled diabetes mellitus (HCC) 06/06/2018  . HTN (hypertension) 06/06/2018  . Gout 06/06/2018  . Morbid obesity (HCC) 06/06/2018  . Chronic pain 06/06/2018  . Ulcers of both lower extremities, limited to breakdown of skin (HCC) 06/06/2018  . Persistent atrial fibrillation   . Chronic systolic heart failure (HCC) 02/02/2018  . COPD (chronic obstructive pulmonary disease) (HCC) 02/02/2018  . Obstructive sleep apnea 02/02/2018  . Acute respiratory failure with hypoxia (HCC) 01/27/2018   PCP:  Dorcas Carrow, DO Pharmacy:   [facility] [address] ([address]), [place] - [address] [place]) [place] [address] Phone: [phone] Fax: [phone]     Social Determinants of Health (SDOH) Interventions    Readmission Risk Interventions Readmission Risk Prevention Plan 07/20/2018 05/30/2018  Transportation Screening Complete Complete  PCP or Specialist Appt within 3-5 Days - Complete  HRI or Home Care Consult - Complete  Social Work Consult for Recovery Care Planning/Counseling - Complete  Palliative Care Screening - Not Applicable  Medication Review Oceanographer) Complete Complete  PCP or Specialist appointment within 3-5 days of discharge Complete -  HRI or Home Care Consult Complete -  SW Recovery Care/Counseling Consult Complete -  Palliative Care Screening  Not Applicable -  Skilled Nursing Facility Not Applicable -

## 2018-07-20 NOTE — Progress Notes (Signed)
Dressing to bilateral legs changed per order. Old dressing was noted to have a very small amount of drainage. Pt tolerated dressing change. I will continue to assess.

## 2018-07-20 NOTE — Progress Notes (Signed)
Cardiac Rehab Navigator/ Exercise Physiologist Note  CHF Education:??  Educational session with patient completed.   Patient reports having multiple Packets at home. This EP reviewed the contents of the "Living Better with Heart Failure" packet but did not provide another. This EP briefly reviewed definition of heart failure and signs and symptoms of an exacerbation. Discussed potential causes of CHF. Explained to patient that HF is a chronic illness which requires self-assessment / self-management along with help from the cardiologist/PCP. Discussed definition of EF measurement along with normal value compared to patient's EF 25-30% measured by ECHO 02/20. ? *Reviewed importance of and reason behind checking weight daily in the AM, after using the bathroom, but before getting dressed. Patient does have a functioning scale but does not weigh daily. Patient reports that when Home Health comes that he weighs. Patient's fiance passed away last month.   *Reviewed with patient the following information: *Discussed when to call the Dr= weight gain of >2-3lb overnight of 5lb in a week,  *Discussed yellow zone= call MD: weight gain of >2-3lb overnight of 5lb in a week, increased swelling, increased SOB when lying down, chest discomfort, dizziness, increased fatigue *Red Zone= call 911: struggle to breath, fainting or near fainting, significant chest pain ?  *Heart Failure Zone Magnet given and reviewed with patient.   ? *Diet - Patient currently ordered carb modified. Dietitian Consultation for diet education has been ordered. Instructed patient to follow a low sodium diet of 2000 mg or less.  Recommended foods for low sodium heart healthy nutrition or heart failure carb modified nutrition therapy discussed.    Reviewed with patient steps to reading a food label with close attention to serving size and mg of sodium.  This EP recommended Ms. Dash and to avoid sausage. This EP warned the patient that he  would pay for eating that way with more fluid retention. ? *Discussed fluid intake with patient as well. Patient not currently on a fluid restriction, but advised no more than 64 ounces of fluid per day. Demonstrated this volume to patient using the bedside water pitcher.   ? *Instructed patient to take medications as prescribed for heart failure. Explained briefly why patient is on the medications (either make you feel better, live longer or keep you out of the hospital) and discussed monitoring and side effects. Patient would like to talk to the SW or Case Manager about cheaper medications. ? *Discussed exercise / activity. Patient is sedentary. Patient uses a walker. Patient has 2 leg wounds. Patient reports that he could only make it from the living room to the door one time per day. Encouraged patient to be as active as possible. Patient would like for [facility] to continue and could also benefit from PT but does not think that PT is a good option because of his pain and limited ability to move. Cardiac Rehab would not be appropriate for patient at this time due to patient's leg wounds, comorbidities, pain, and limited movement. Patient was made aware of the referral and opportunity and will call if he becomes able to participate in the future. ? *Smoking Cessation- Patient is a NEVER smoker.   *[facility] Heart Failure Clinic - Explained the purpose of the HF Clinic. ?Explained to patient the HF Clinic does not replace PCP nor Cardiologist, but is an additional resource to helping patient manage heart failure at home. Patient does not wish to be followed in the [facility] HF Clinic. Patient reports that he was supposed to have an  appt at the HF Clinic on a day that it snowed. Patient arrived at appt and the clinic was closed. Patient does not care to have another appt made there. ? Again, the 5 Steps to Living Better with Heart Failure were reviewed with patient.   Patient thanked me for providing the above  information. ?  Nelson Chimes, BS [place]  [facility] Cardiac & Pulmonary Rehab  Exercise Physiologist Department Phone #: [phone] Fax: [phone]  Direct Line [phone] Email Address: [email or web address]

## 2018-07-20 NOTE — Progress Notes (Signed)
Initial Nutrition Assessment  DOCUMENTATION CODES:   Morbid obesity  INTERVENTION:   Ensure Max protein supplement BID, each supplement provides 150kcal and 30g of protein.  MVI daily   Vitamin C 250mg  po BID   3 gram sodium diet   Low sodium diet education   NUTRITION DIAGNOSIS:   Increased nutrient needs related to wound healing, other (see comment)(morbid obesity) as evidenced by increased estimated needs.  GOAL:   Patient will meet greater than or equal to 90% of their needs  MONITOR:   PO intake, Supplement acceptance, Labs, Weight trends, Skin, I & O's  REASON FOR ASSESSMENT:   Consult Diet education  ASSESSMENT:   58 year old male patient always with history of congestive heart failure, atrial fibrillation, sleep apnea, COPD currently under hospitalist service for shortness of breath   RD working remotely.  Spoke with pt via phone. Pt reports good appetite and oral intake but states that he hates the food here. Pt documented to be eating 75-100% of meals. Pt reports that he drinks Ensure at home daily. Per chart, pt with weight gain pta r/t edema. RD will add supplements and vitamins to help pt meet his estimated needs and support wound healing. Pt provided with low sodium diet education today. RD will also liberalize the heart healthy portion of pt's diet as this is restrictive of protein; will change pt to a 3 gram sodium diet.   Medications reviewed and include: allopurinol. Colace, lasix, insulin, synthroid, meloxicam, aldactone, thiamine   Labs reviewed: BUN 35(H), creat 1.26(H) Hgb 10.0(L), Hct 32.6(L)  Unable to complete Nutrition-Focused physical exam at this time.   Diet Order:   Diet Order            Diet 2 gram sodium Room service appropriate? Yes; Fluid consistency: Thin; Fluid restriction: 2000 mL Fluid  Diet effective now             EDUCATION NEEDS:   Education needs have been addressed  Skin:  Skin Assessment: Reviewed RN  Assessment(Right lateral LE:  1.4cm x 2.2cm x 0.2cm, Left lateral LE: 2.5cm x 3.8cm x 0.2cm)  Last BM:  6/2  Height:   Ht Readings from Last 1 Encounters:  07/18/18 5\' 8"  (1.727 m)    Weight:   Wt Readings from Last 1 Encounters:  07/20/18 (!) 181.2 kg    Ideal Body Weight:  70 kg  BMI:  Body mass index is 60.74 kg/m.  Estimated Nutritional Needs:   Kcal:  2700-3000kcal/day   Protein:  >145g/day   Fluid:  2L/day   Betsey Holiday MS, RD, LDN Pager #- [phone] Office#- [phone] After Hours Pager: [phone]

## 2018-07-20 NOTE — Progress Notes (Signed)
Progress Note  Patient Name: [name] [name] Date of Encounter: 07/20/2018  Primary Cardiologist:   Subjective   Feels his breathing is improved, lower extremity edema much softer Getting closer to his baseline Complaining about abdominal bloating, chronic nausea Chronic back pain and sciatica " Cannot walk"   Inpatient Medications    Scheduled Meds: . allopurinol  100 mg Oral Daily  . amiodarone  200 mg Oral Daily  . atorvastatin  40 mg Oral q1800  . docusate sodium  100 mg Oral BID  . ferrous sulfate  325 mg Oral BID WC  . fluticasone  2 spray Each Nare BID  . furosemide  40 mg Intravenous BID  . gabapentin  300 mg Oral TID  . insulin aspart  0-15 Units Subcutaneous TID WC  . insulin aspart  0-5 Units Subcutaneous QHS  . ketotifen  1 drop Both Eyes BID  . levothyroxine  25 mcg Oral QAC breakfast  . lisinopril  2.5 mg Oral Daily  . loratadine  10 mg Oral Daily  . meloxicam  15 mg Oral Daily  . metoprolol succinate  50 mg Oral BID  . mometasone-formoterol  2 puff Inhalation BID  . rivaroxaban  20 mg Oral Q supper  . sodium chloride flush  3 mL Intravenous Q12H  . spironolactone  25 mg Oral Daily  . thiamine  250 mg Oral Daily   Continuous Infusions:  PRN Meds: acetaminophen **OR** acetaminophen, albuterol, morphine injection, nitroGLYCERIN, ondansetron **OR** ondansetron (ZOFRAN) IV, oxyCODONE-acetaminophen, polyvinyl alcohol, sodium chloride flush, traZODone   Vital Signs    Vitals:   07/19/18 1936 07/20/18 0056 07/20/18 0637 07/20/18 0751  BP: 107/71  (!) 106/59 106/71  Pulse: 66 74 (!) 53 67  Resp: 18  18 16   Temp: 98.3 F (36.8 C)  98 F (36.7 C) 98.3 F (36.8 C)  TempSrc: Oral   Oral  SpO2: 94% 98% 100% 100%  Weight:   (!) 181.2 kg   Height:        Intake/Output Summary (Last 24 hours) at 07/20/2018 1037 Last data filed at 07/20/2018 1006 Gross per 24 hour  Intake 600 ml  Output 2050 ml  Net -1450 ml   Last 3 Weights 07/20/2018 07/19/2018 07/18/2018   Weight (lbs) 399 lb 8 oz 401 lb 3.2 oz 400 lb 3.2 oz  Weight (kg) 181.212 kg 181.983 kg 181.53 kg      Telemetry    Atrial fibrillation- Personally Reviewed  ECG    - Personally Reviewed  Physical Exam  Morbid obesity GEN: No acute distress.   Neck: No JVD Cardiac:  Irregularly irregular no murmurs, rubs, or gallops.  Leg wraps in place Respiratory: Clear to auscultation bilaterally. GI: Soft, nontender, non-distended  MS: No edema; No deformity. Neuro:  Nonfocal  Psych: Normal affect   Labs    Chemistry Recent Labs  Lab 07/17/18 2333 07/19/18 0404 07/20/18 0708  NA 137 133* 135  K 4.0 4.8 4.7  CL 105 101 100  CO2 23 24 25   GLUCOSE 119* 147* 101*  BUN 15 25* 35*  CREATININE 0.95 1.05 1.26*  CALCIUM 8.2* 8.6* 9.0  PROT 7.0  --   --   ALBUMIN 3.5  --   --   AST 20  --   --   ALT 10  --   --   ALKPHOS 78  --   --   BILITOT 0.7  --   --   GFRNONAA >60 >60 >60  GFRAA >60 >  60 >60  ANIONGAP 9 8 10      Hematology Recent Labs  Lab 07/17/18 2333  WBC 5.9  RBC 4.03*  HGB 10.0*  HCT 32.6*  MCV 80.9  MCH 24.8*  MCHC 30.7  RDW 19.9*  PLT 266    Cardiac Enzymes Recent Labs  Lab 07/17/18 2333 07/18/18 0145 07/18/18 0614  TROPONINI 0.03* 0.04* 0.03*   No results for input(s): TROPIPOC in the last 168 hours.   BNP Recent Labs  Lab 07/17/18 2333  BNP 290.0*     DDimer No results for input(s): DDIMER in the last 168 hours.   Radiology    No results found.  Cardiac Studies   Echo 03/2018  1. The left ventricle has severely reduced systolic function of 25-30%. The cavity size was mildly increased. There is no increased left ventricular wall thickness. Left ventricular diastology could not be evaluated due to nondiagnostic images. Left  ventricular diffuse hypokinesis.  2. Left atrial size was moderately dilated.  3. Not assessed.  4. The mitral valve was not assessed. There is mild thickening and mild calcification. Mitral valve regurgitation  was not significant.  5. The tricuspid valve is not assessed. Tricuspid valve regurgitation was not assessed by color flow Doppler.  6. The aortic valve was not assessed.  7. The interatrial septum was not well visualized.   Patient Profile     [name] [name] is a 58 y.o. male with a hx of chronic systolic heart failure (EF 25-30%), persistent atrial fibrillation s/p cardioversion (03/2018), OSA, COPD, DM2, and morbid obesity, who is being seen today for the evaluation of acute on chronic heart failure and atypical CP   Assessment & Plan    Acute on chronic systolic heart failure (EF 25-30%)  multifactorial given known OSA and COPD with Afib in setting of HFrEF.  - EF per 03/2018 echo 25-30% and NICM  Appears to be getting slightly prerenal but still reports abdominal bloating leg swelling shortness of breath  Continue IV diuresis lasix BID 1 more day then changed to oral dosing -Continue spironolactone, lisinopril, and metoprolol   medication noncompliance in past -Needs paramedicine service/CHF clinic  Atypical Chronic Chest Pain  Likely supply demand ischemia -Troponin 0.03  0.04  0.03 No plan for ischemic w/u  Persistent atrial fibrillation -Remains in Afib Previous cardioversion in 03/2010.  Plan was for outpatient cardioversion before this admission (at 05/2018 f/u). - CHA2DS2VASc score of at least 4 (CHF, HTN, DM2, h/o PE,vascular).  -Continue amiodarone, Xarelto, and BB  Likely contributing to his shortness of breath and lower ejection fraction  Anemia - Hgb 10.0,  stable  This will also contribute to his shortness of breath  DM2 -- SSI, per IM A1C. -02/2018  6.9.  Hypothyroid -- TSH borderline as above - f/u with PCP recommended. -- Continue synthroid.  Morbid obesity - Lifestyle changes strongly encouraged. Need dietary consult Weight markedly elevated contributing to poor health and medical issues detailed above  Pauses In the setting of sleep apnea No  further intervention needed   Total encounter time more than 25 minutes  Greater than 50% was spent in counseling and coordination of care with the patient   For questions or updates, please contact [facility] Please consult [email or web address] for contact info under        Signed, Julien Nordmann, MD  07/20/2018, 10:37 AM

## 2018-07-20 NOTE — Progress Notes (Signed)
[facility] [facility] - [place] at [facility]   PATIENT NAME: [name] [name]    MR#:  [id number]  DATE OF BIRTH:  September 27, 1960  SUBJECTIVE:  CHIEF COMPLAINT:   Chief Complaint  Patient presents with  . Chest Pain  The patient complains of back pain and leg pain.  She still has leg edema. REVIEW OF SYSTEMS:    ROS  CONSTITUTIONAL: No documented fever. No fatigue, weakness. No weight gain, no weight loss.  EYES: No blurry or double vision.  ENT: No tinnitus. No postnasal drip. No redness of the oropharynx.  RESPIRATORY: No cough, no wheeze, no hemoptysis. Has dyspnea.  CARDIOVASCULAR: No chest pain. Has orthopnea. No palpitations. No syncope.  Bilateral leg swelling. GASTROINTESTINAL: No nausea, no vomiting or diarrhea. No abdominal pain. No melena or hematochezia.  GENITOURINARY: No dysuria or hematuria.  ENDOCRINE: No polyuria or nocturia. No heat or cold intolerance.  HEMATOLOGY: No anemia. No bruising. No bleeding.  INTEGUMENTARY: No rashes. No lesions.  MUSCULOSKELETAL: No arthritis. Has swelling. No gout.  Back pain and leg pain. NEUROLOGIC: No numbness, tingling, or ataxia. No seizure-type activity.  PSYCHIATRIC: No anxiety. No insomnia. No ADD.   DRUG ALLERGIES:  No Known Allergies  VITALS:  Blood pressure 106/71, pulse 67, temperature 98.3 F (36.8 C), temperature source Oral, resp. rate 16, height 5\' 8"  (1.727 m), weight (!) 181.2 kg, SpO2 100 %.  PHYSICAL EXAMINATION:   Physical Exam  GENERAL:  58 y.o.-year-old patient lying in the bed with no acute distress.  Morbid obesity. EYES: Pupils equal, round, reactive to light and accommodation. No scleral icterus. Extraocular muscles intact.  HEENT: Head atraumatic, normocephalic. Oropharynx and nasopharynx clear.  NECK:  Supple, no jugular venous distention. No thyroid enlargement, no tenderness.  LUNGS: Decreased breath sounds bilaterally, bibasilar crepitations heard. CARDIOVASCULAR: S1, S2 normal. No  murmurs, rubs, or gallops.  ABDOMEN: Soft, nontender, nondistended. Bowel sounds present. No organomegaly or mass.  EXTREMITIES: No cyanosis, No clubbing, bilateral leg edema 2+. NEUROLOGIC: Cranial nerves II through XII are intact. No focal Motor or sensory deficits b/l.   PSYCHIATRIC: The patient is alert and oriented x 3.  SKIN: No obvious rash, lesion, or ulcer.   LABORATORY PANEL:   CBC Recent Labs  Lab 07/17/18 2333  WBC 5.9  HGB 10.0*  HCT 32.6*  PLT 266   ------------------------------------------------------------------------------------------------------------------ Chemistries  Recent Labs  Lab 07/17/18 2333  07/20/18 0708  NA 137   < > 135  K 4.0   < > 4.7  CL 105   < > 100  CO2 23   < > 25  GLUCOSE 119*   < > 101*  BUN 15   < > 35*  CREATININE 0.95   < > 1.26*  CALCIUM 8.2*   < > 9.0  AST 20  --   --   ALT 10  --   --   ALKPHOS 78  --   --   BILITOT 0.7  --   --    < > = values in this interval not displayed.   ------------------------------------------------------------------------------------------------------------------  Cardiac Enzymes Recent Labs  Lab 07/18/18 0614  TROPONINI 0.03*   ------------------------------------------------------------------------------------------------------------------  RADIOLOGY:  No results found.   ASSESSMENT AND PLAN:   58 year old male patient always with history of congestive heart failure, atrial fibrillation, sleep apnea, COPD currently under hospitalist service for shortness of breath  -Acute on chronic systolic heart failure exacerbation Improving slowly EF of 25 to 30% from echo in February 2020 Continue  diuresis with Lasix and Aldactone Continue ACE inhibitor and beta-blocker Not a candidate for Entresto given history of noncompliance Monitor renal function Monitor input output chart and daily body weights Continue IV Lasix and may change to p.o. tomorrow per Dr. Mariah Milling.  -Chronic atrial  fibrillation Rate controlled with metoprolol and amiodarone Continue Xarelto for anticoagulation  -Hypothyroidism Continue Synthroid  -Hypertension Continue ACE inhibitor  -Obesity Healthy diet and exercise consult  -DVT prophylaxis On anticoagulation with Xarelto  Chronic pain syndrome.  Pain control.  I discussed with Dr. Mariah Milling. I called the patient's sister, but nobody answered from. All the records are reviewed and case discussed with Care Management/Social Worker. Management plans discussed with the patient, family and they are in agreement.  CODE STATUS: Full code  DVT Prophylaxis: SCDs  TOTAL TIME TAKING CARE OF THIS PATIENT: 35 minutes.   POSSIBLE D/C IN 2 DAYS, DEPENDING ON CLINICAL CONDITION.  Shaune Pollack M.D on 07/20/2018 at 3:33 PM  Between 7am to 6pm - Pager - [phone]  After 6pm go to [email or web address] - password [email or web address] [facility]  [facility] [place] [facility]  Office  [phone]  CC: Primary care physician; Dorcas Carrow, DO  Note: This dictation was prepared with Dragon dictation along with smaller phrase technology. Any transcriptional errors that result from this process are unintentional.

## 2018-07-21 LAB — GLUCOSE, CAPILLARY
Glucose-Capillary: 124 mg/dL — ABNORMAL HIGH (ref 70–99)
Glucose-Capillary: 89 mg/dL (ref 70–99)

## 2018-07-21 LAB — BASIC METABOLIC PANEL
Anion gap: 10 (ref 5–15)
BUN: 44 mg/dL — ABNORMAL HIGH (ref 6–20)
CO2: 25 mmol/L (ref 22–32)
Calcium: 8.9 mg/dL (ref 8.9–10.3)
Chloride: 101 mmol/L (ref 98–111)
Creatinine, Ser: 1.26 mg/dL — ABNORMAL HIGH (ref 0.61–1.24)
GFR calc Af Amer: >60 mL/min (ref >60)
GFR calc non Af Amer: >60 mL/min (ref >60)
Glucose, Bld: 134 mg/dL — ABNORMAL HIGH (ref 70–99)
Potassium: 5.2 mmol/L — ABNORMAL HIGH (ref 3.5–5.1)
Sodium: 136 mmol/L (ref 135–145)

## 2018-07-21 LAB — MAGNESIUM: Magnesium: 2 mg/dL (ref 1.7–2.4)

## 2018-07-21 MED ORDER — AMIODARONE HCL 200 MG PO TABS
200.0000 mg | ORAL_TABLET | Freq: Two times a day (BID) | ORAL | Status: DC
  Administered 2018-07-21 – 2018-07-23 (×4): 200 mg via ORAL
  Filled 2018-07-21 (×4): qty 1

## 2018-07-21 MED ORDER — FUROSEMIDE 40 MG PO TABS
40.0000 mg | ORAL_TABLET | Freq: Every day | ORAL | Status: DC
  Administered 2018-07-21: 40 mg via ORAL
  Filled 2018-07-21: qty 1

## 2018-07-21 MED ORDER — TORSEMIDE 20 MG PO TABS
40.0000 mg | ORAL_TABLET | Freq: Every day | ORAL | Status: DC
  Administered 2018-07-22 – 2018-07-23 (×2): 40 mg via ORAL
  Filled 2018-07-21 (×2): qty 2

## 2018-07-21 NOTE — TOC Progression Note (Addendum)
Transition of Care [facility]) - Progression Note    Patient Details  Name: Christepher Fronheiser MRN: [id number] Date of Birth: 1960-03-19  Transition of Care [facility]) CM/SW Contact  Eber Hong, RN Phone Number: 07/21/2018, 6:30 PM  Clinical Narrative:    Obtained order for bariatric rollator walker.  It will be delivered to patient's home.  There are none on site. UPdated patient    Expected Discharge Plan: Home w Home Health Services Barriers to Discharge: Continued Medical Work up  Expected Discharge Plan and Services Expected Discharge Plan: Home w Home Health Services   Discharge Planning Services: CM Consult   Living arrangements for the past 2 months: Apartment                 DME Arranged: Walker rolling with seat DME Agency: [employer] Date DME Agency Contacted: 07/20/18 Time DME Agency Contacted: 1549   HH Arranged: RN, PT, OT, Nurse's Aide, Social Work [employer] Agency: [employer] (now [facility]) Date HH Agency Contacted: 07/20/18 Time HH Agency Contacted: 1549 Representative spoke with at [facility] Agency: Rosey Bath   Social Determinants of Health (SDOH) Interventions    Readmission Risk Interventions Readmission Risk Prevention Plan 07/20/2018 05/30/2018  Transportation Screening Complete Complete  PCP or Specialist Appt within 3-5 Days - Complete  HRI or Home Care Consult - Complete  Social Work Consult for Recovery Care Planning/Counseling - Complete  Palliative Care Screening - Not Applicable  Medication Review [employer]) Complete Complete  PCP or Specialist appointment within 3-5 days of discharge Complete -  HRI or Home Care Consult Complete -  SW Recovery Care/Counseling Consult Complete -  Palliative Care Screening Not Applicable -  Skilled Nursing Facility Not Applicable -

## 2018-07-21 NOTE — Progress Notes (Signed)
[facility] - [facility]   PATIENT NAME: [name] [name]    MR#:  [id number]  DATE OF BIRTH:  Jan 22, 1961  SUBJECTIVE:  CHIEF COMPLAINT:   Chief Complaint  Patient presents with  . Chest Pain  The patient complains of back pain and leg pain.  better leg edema. REVIEW OF SYSTEMS:    ROS  CONSTITUTIONAL: No documented fever. No fatigue, weakness. No weight gain, no weight loss.  EYES: No blurry or double vision.  ENT: No tinnitus. No postnasal drip. No redness of the oropharynx.  RESPIRATORY: No cough, no wheeze, no hemoptysis. Has dyspnea.  CARDIOVASCULAR: No chest pain. Has orthopnea. No palpitations. No syncope.  Bilateral leg swelling. GASTROINTESTINAL: No nausea, no vomiting or diarrhea. No abdominal pain. No melena or hematochezia.  GENITOURINARY: No dysuria or hematuria.  ENDOCRINE: No polyuria or nocturia. No heat or cold intolerance.  HEMATOLOGY: No anemia. No bruising. No bleeding.  INTEGUMENTARY: No rashes. No lesions.  MUSCULOSKELETAL: No arthritis. Has swelling. No gout.  Back pain and leg pain. NEUROLOGIC: No numbness, tingling, or ataxia. No seizure-type activity.  PSYCHIATRIC: No anxiety. No insomnia. No ADD.   DRUG ALLERGIES:  No Known Allergies  VITALS:  Blood pressure (!) 111/57, pulse 63, temperature (!) 97.5 F (36.4 C), temperature source Oral, resp. rate 16, height 5\' 8"  (1.727 m), weight (!) 181.5 kg, SpO2 100 %.  PHYSICAL EXAMINATION:   Physical Exam  GENERAL:  58 y.o.-year-old patient lying in the bed with no acute distress.  Morbid obesity. EYES: Pupils equal, round, reactive to light and accommodation. No scleral icterus. Extraocular muscles intact.  HEENT: Head atraumatic, normocephalic. Oropharynx and nasopharynx clear.  NECK:  Supple, no jugular venous distention. No thyroid enlargement, no tenderness.  LUNGS: Decreased breath sounds bilaterally, bibasilar crepitations heard. CARDIOVASCULAR: S1, S2 normal. No  murmurs, rubs, or gallops.  ABDOMEN: Soft, nontender, nondistended. Bowel sounds present. No organomegaly or mass.  EXTREMITIES: No cyanosis, No clubbing, bilateral leg edema 2+. NEUROLOGIC: Cranial nerves II through XII are intact. No focal Motor or sensory deficits b/l.   PSYCHIATRIC: The patient is alert and oriented x 3.  SKIN: No obvious rash, lesion, or ulcer.   LABORATORY PANEL:   CBC Recent Labs  Lab 07/17/18 2333  WBC 5.9  HGB 10.0*  HCT 32.6*  PLT 266   ------------------------------------------------------------------------------------------------------------------ Chemistries  Recent Labs  Lab 07/17/18 2333  07/21/18 0540  NA 137   < > 136  K 4.0   < > 5.2*  CL 105   < > 101  CO2 23   < > 25  GLUCOSE 119*   < > 134*  BUN 15   < > 44*  CREATININE 0.95   < > 1.26*  CALCIUM 8.2*   < > 8.9  MG  --   --  2.0  AST 20  --   --   ALT 10  --   --   ALKPHOS 78  --   --   BILITOT 0.7  --   --    < > = values in this interval not displayed.   ------------------------------------------------------------------------------------------------------------------  Cardiac Enzymes Recent Labs  Lab 07/18/18 0614  TROPONINI 0.03*   ------------------------------------------------------------------------------------------------------------------  RADIOLOGY:  No results found.   ASSESSMENT AND PLAN:   58 year old male patient always with history of congestive heart failure, atrial fibrillation, sleep apnea, COPD currently under hospitalist service for shortness of breath  -Acute on chronic systolic heart failure exacerbation Improving slowly EF of  25 to 30% from echo in February 2020 Continue diuresis with Lasix and Aldactone Continue ACE inhibitor and beta-blocker Not a candidate for Entresto given history of noncompliance Monitor renal function Monitor input output chart and daily body weights He is on IV Lasix and change to torsemide per Dr. Mariah Milling.  -Chronic  atrial fibrillation Rate controlled with metoprolol and amiodarone Continue Xarelto for anticoagulation  -Hypothyroidism Continue Synthroid  -Hypertension Continue ACE inhibitor  -Obesity Healthy diet and exercise consult  -DVT prophylaxis On anticoagulation with Xarelto  Chronic pain syndrome.  Pain control. Hyperkalemia and mild elevated creatinine.  Hold lisinopril and spironolactone, follow-up BMP.  The patient has multiple medical problems and generalized weakness.  Need home health.  He refused home PT. I called the patient's sister, but nobody answered the phone. All the records are reviewed and case discussed with Care Management/Social Worker. Management plans discussed with the patient, family and they are in agreement.  CODE STATUS: Full code  DVT Prophylaxis: SCDs  TOTAL TIME TAKING CARE OF THIS PATIENT: 28 minutes.   POSSIBLE D/C IN 2 DAYS, DEPENDING ON CLINICAL CONDITION.  Shaune Pollack M.D on 07/21/2018 at 12:41 PM  Between 7am to 6pm - Pager - [phone]  After 6pm go to [email or web address] - password [email or web address] [facility]  [facility]  Hospitalists  Office  [phone]  CC: Primary care physician; Dorcas Carrow, DO  Note: This dictation was prepared with Dragon dictation along with smaller phrase technology. Any transcriptional errors that result from this process are unintentional.

## 2018-07-21 NOTE — Progress Notes (Signed)
Progress Note  Patient Name: [name] [name] Date of Encounter: 07/21/2018  Primary Cardiologist:   Subjective   Feels that his lungs are more clear, close to baseline Still complains of nausea, bloating, chronic issue Does not walk very far, limited by chronic back pain 3 L negative past 24 hours  Inpatient Medications    Scheduled Meds: . allopurinol  100 mg Oral Daily  . amiodarone  200 mg Oral BID  . atorvastatin  40 mg Oral q1800  . docusate sodium  100 mg Oral BID  . ferrous sulfate  325 mg Oral BID WC  . fluticasone  2 spray Each Nare BID  . gabapentin  300 mg Oral TID  . insulin aspart  0-15 Units Subcutaneous TID WC  . insulin aspart  0-5 Units Subcutaneous QHS  . ketotifen  1 drop Both Eyes BID  . levothyroxine  25 mcg Oral QAC breakfast  . lisinopril  2.5 mg Oral Daily  . loratadine  10 mg Oral Daily  . meloxicam  15 mg Oral Daily  . metoprolol succinate  50 mg Oral BID  . mometasone-formoterol  2 puff Inhalation BID  . multivitamin with minerals  1 tablet Oral Daily  . Ensure Max Protein  11 oz Oral BID  . rivaroxaban  20 mg Oral Q supper  . sodium chloride flush  3 mL Intravenous Q12H  . spironolactone  25 mg Oral Daily  . thiamine  250 mg Oral Daily  . torsemide  40 mg Oral Daily  . vitamin C  250 mg Oral BID   Continuous Infusions:  PRN Meds: acetaminophen **OR** acetaminophen, albuterol, cyclobenzaprine, morphine injection, nitroGLYCERIN, ondansetron **OR** ondansetron (ZOFRAN) IV, oxyCODONE-acetaminophen, polyvinyl alcohol, sodium chloride flush, traZODone   Vital Signs    Vitals:   07/20/18 0751 07/20/18 2114 07/20/18 2322 07/21/18 0504  BP: 106/71 117/65  (!) 111/57  Pulse: 67 73 70 63  Resp: 16 16 18 16   Temp: 98.3 F (36.8 C) 98.2 F (36.8 C)  (!) 97.5 F (36.4 C)  TempSrc: Oral Oral  Oral  SpO2: 100% 99%  100%  Weight:    (!) 181.5 kg  Height:        Intake/Output Summary (Last 24 hours) at 07/21/2018 1104 Last data filed at  07/21/2018 0900 Gross per 24 hour  Intake 720 ml  Output 3400 ml  Net -2680 ml   Last 3 Weights 07/21/2018 07/20/2018 07/19/2018  Weight (lbs) 400 lb 1.6 oz 399 lb 8 oz 401 lb 3.2 oz  Weight (kg) 181.484 kg 181.212 kg 181.983 kg      Telemetry    Atrial fibrillation- Personally Reviewed  ECG    - Personally Reviewed  Physical Exam  Morbid obesity Constitutional:  oriented to person, place, and time. No distress.  HENT:  Head: Grossly normal Eyes:  no discharge. No scleral icterus.  Neck: Unable to estimate JVD, no carotid bruits  Cardiovascular: Irregularly irregular no murmurs appreciated Pulmonary/Chest: Clear to auscultation bilaterally, no wheezes or rails Abdominal: Soft.  no distension.  no tenderness.  Musculoskeletal: Normal range of motion Neurological:  normal muscle tone. Coordination normal. No atrophy Skin: Skin warm and dry Psychiatric: normal affect, pleasant   Labs    Chemistry Recent Labs  Lab 07/17/18 2333 07/19/18 0404 07/20/18 0708 07/21/18 0540  NA 137 133* 135 136  K 4.0 4.8 4.7 5.2*  CL 105 101 100 101  CO2 23 24 25 25   GLUCOSE 119* 147* 101* 134*  BUN  15 25* 35* 44*  CREATININE 0.95 1.05 1.26* 1.26*  CALCIUM 8.2* 8.6* 9.0 8.9  PROT 7.0  --   --   --   ALBUMIN 3.5  --   --   --   AST 20  --   --   --   ALT 10  --   --   --   ALKPHOS 78  --   --   --   BILITOT 0.7  --   --   --   GFRNONAA >60 >60 >60 >60  GFRAA >60 >60 >60 >60  ANIONGAP 9 8 10 10      Hematology Recent Labs  Lab 07/17/18 2333  WBC 5.9  RBC 4.03*  HGB 10.0*  HCT 32.6*  MCV 80.9  MCH 24.8*  MCHC 30.7  RDW 19.9*  PLT 266    Cardiac Enzymes Recent Labs  Lab 07/17/18 2333 07/18/18 0145 07/18/18 0614  TROPONINI 0.03* 0.04* 0.03*   No results for input(s): TROPIPOC in the last 168 hours.   BNP Recent Labs  Lab 07/17/18 2333  BNP 290.0*     DDimer No results for input(s): DDIMER in the last 168 hours.   Radiology    No results found.  Cardiac  Studies   Echo 03/2018  1. The left ventricle has severely reduced systolic function of 25-30%. The cavity size was mildly increased. There is no increased left ventricular wall thickness. Left ventricular diastology could not be evaluated due to nondiagnostic images. Left  ventricular diffuse hypokinesis.  2. Left atrial size was moderately dilated.  3. Not assessed.  4. The mitral valve was not assessed. There is mild thickening and mild calcification. Mitral valve regurgitation was not significant.  5. The tricuspid valve is not assessed. Tricuspid valve regurgitation was not assessed by color flow Doppler.  6. The aortic valve was not assessed.  7. The interatrial septum was not well visualized.   Patient Profile     [name] [name] is a 58 y.o. male with a hx of chronic systolic heart failure (EF 25-30%), persistent atrial fibrillation s/p cardioversion (03/2018), OSA, COPD, DM2, and morbid obesity, who is being seen today for the evaluation of acute on chronic heart failure and atypical CP   Assessment & Plan    Acute on chronic systolic heart failure (EF 25-30%)  multifactorial given known OSA and COPD with Afib in setting of HFrEF.  - EF per 03/2018 echo 25-30% and NICM  -Appears Lasix IV has been changed to oral dosing He takes torsemide at home, will increase up to torsemide 40 mg daily Needs to take extra 40 mg for twice daily dosing for 3 pound weight gain  Atypical Chronic Chest Pain demand ischemia No plan for ischemic w/u  Persistent atrial fibrillation -Remains in Afib  Previous cardioversion in 03/2010.  Plan was for outpatient cardioversion before this admission (at 05/2018 f/u). - CHA2DS2VASc score of at least 4 (CHF, HTN, DM2, h/o PE,vascular).  -Continue amiodarone, Xarelto, and BB  Would increase amiodarone up to 200 twice daily for 2 weeks, plan for cardioversion in 2 weeks time Given his body habitus sleep disorder he is high risk of recurrent arrhythmia   Anemia - Hgb 10.0,  stable  Low iron, he has received iron infusion today  DM2 -- SSI, per IM A1C. -02/2018  6.9.  Hypothyroid -- TSH borderline as above - f/u with PCP recommended. -- Continue synthroid.  Morbid obesity Need dietary consult Predominant reason for his medical issues  detailed above  Pauses Bradycardia overnight in the setting of sleep apnea, asymptomatic   Total encounter time more than 25 minutes  Greater than 50% was spent in counseling and coordination of care with the patient   For questions or updates, please contact [facility] Please consult [email or web address] for contact info under        Signed, Julien Nordmannimothy Auther Lyerly, MD  07/21/2018, 11:04 AM

## 2018-07-22 LAB — BASIC METABOLIC PANEL
Anion gap: 9 (ref 5–15)
BUN: 47 mg/dL — ABNORMAL HIGH (ref 6–20)
CO2: 24 mmol/L (ref 22–32)
Calcium: 8.7 mg/dL — ABNORMAL LOW (ref 8.9–10.3)
Chloride: 101 mmol/L (ref 98–111)
Creatinine, Ser: 1.28 mg/dL — ABNORMAL HIGH (ref 0.61–1.24)
GFR calc Af Amer: >60 mL/min (ref >60)
GFR calc non Af Amer: >60 mL/min (ref >60)
Glucose, Bld: 128 mg/dL — ABNORMAL HIGH (ref 70–99)
Potassium: 4.3 mmol/L (ref 3.5–5.1)
Sodium: 134 mmol/L — ABNORMAL LOW (ref 135–145)

## 2018-07-22 LAB — GLUCOSE, CAPILLARY: Glucose-Capillary: 104 mg/dL — ABNORMAL HIGH (ref 70–99)

## 2018-07-22 NOTE — Progress Notes (Signed)
[employer] - [place] at [facility]   PATIENT NAME: [name] [name]    MR#:  [id number]  DATE OF BIRTH:  [birth year]  SUBJECTIVE: Patient complains of chronic back pain, leg pain.  Shortness of breath better.  Very weak and states he not ready for discharge.  Patient complains of chronic back pain and asking to refer him to pain management clinic.  CHIEF COMPLAINT:   Chief Complaint  Patient presents with  . Chest Pain  T REVIEW OF SYSTEMS:    ROS  CONSTITUTIONAL: No documented fever. No fatigue, weakness. No weight gain, no weight loss.  EYES: No blurry or double vision.  ENT: No tinnitus. No postnasal drip. No redness of the oropharynx.  RESPIRATORY: No cough, no wheeze, no hemoptysis. Has dyspnea.  CARDIOVASCULAR: No chest pain. Has orthopnea. No palpitations. No syncope.  Bilateral leg swelling. GASTROINTESTINAL: No nausea, no vomiting or diarrhea. No abdominal pain. No melena or hematochezia.  GENITOURINARY: No dysuria or hematuria.  ENDOCRINE: No polyuria or nocturia. No heat or cold intolerance.  HEMATOLOGY: No anemia. No bruising. No bleeding.  INTEGUMENTARY: No rashes. No lesions.  MUSCULOSKELETAL: No arthritis. Has swelling. No gout.  Back pain and leg pain. NEUROLOGIC: No numbness, tingling, or ataxia. No seizure-type activity.  PSYCHIATRIC: No anxiety. No insomnia. No ADD.   DRUG ALLERGIES:  No Known Allergies  VITALS:  Blood pressure (!) 100/52, pulse 63, temperature 98.4 F (36.9 C), temperature source Oral, resp. rate 18, height 5\' 8"  (1.727 m), weight (!) 184.5 kg, SpO2 98 %.  PHYSICAL EXAMINATION:   Physical Exam  GENERAL:  58 y.o.-year-old patient lying in the bed with no acute distress.  Morbid obesity. EYES: Pupils equal, round, reactive to light and accommodation. No scleral icterus. Extraocular muscles intact.  HEENT: Head atraumatic, normocephalic. Oropharynx and nasopharynx clear.  NECK:  Supple, no jugular venous distention. No  thyroid enlargement, no tenderness.  LUNGS: Decreased breath sounds bilaterally, bibasilar crepitations heard. CARDIOVASCULAR: S1, S2 normal. No murmurs, rubs, or gallops.  ABDOMEN: Soft, nontender, nondistended. Bowel sounds present. No organomegaly or mass.  EXTREMITIES: No cyanosis, No clubbing, bilateral leg edema 2+. NEUROLOGIC: Cranial nerves II through XII are intact. No focal Motor or sensory deficits b/l.   PSYCHIATRIC: The patient is alert and oriented x 3.  SKIN: No obvious rash, lesion, or ulcer.   LABORATORY PANEL:   CBC Recent Labs  Lab 07/17/18 2333  WBC 5.9  HGB 10.0*  HCT 32.6*  PLT 266   ------------------------------------------------------------------------------------------------------------------ Chemistries  Recent Labs  Lab 07/17/18 2333  07/21/18 0540 07/22/18 0541  NA 137   < > 136 134*  K 4.0   < > 5.2* 4.3  CL 105   < > 101 101  CO2 23   < > 25 24  GLUCOSE 119*   < > 134* 128*  BUN 15   < > 44* 47*  CREATININE 0.95   < > 1.26* 1.28*  CALCIUM 8.2*   < > 8.9 8.7*  MG  --   --  2.0  --   AST 20  --   --   --   ALT 10  --   --   --   ALKPHOS 78  --   --   --   BILITOT 0.7  --   --   --    < > = values in this interval not displayed.   ------------------------------------------------------------------------------------------------------------------  Cardiac Enzymes Recent Labs  Lab 07/18/18 0614  TROPONINI 0.03*   ------------------------------------------------------------------------------------------------------------------  RADIOLOGY:  No results found.   ASSESSMENT AND PLAN:   58 year old male patient always with history of congestive heart failure, atrial fibrillation, sleep apnea, COPD currently under hospitalist service for shortness of breath  -Acute on chronic systolic heart failure exacerbation Improving slowly EF of 25 to 30% from echo in February 2020 Continue torsemide but increase the dose to 40 mg daily.  Possible  discharge tomorrow.   -Chronic atrial fibrillation Rate controlled with metoprolol and amiodarone Continue Xarelto for anticoagulation, cardiology is planning for cardioversion in 2 weeks as an outpatient.  -Hypothyroidism Continue Synthroid  -Hypertension Continue ACE inhibitor  -Obesity Healthy diet and exercise consult  -DVT prophylaxis On anticoagulation with Xarelto  Chronic pain syndrome.  Pain control. Hyperkalemia and mild elevated creatinine.  Hold lisinopril and spironolactone, follow-up BMP.  The patient has multiple medical problems and generalized weakness.  Need home health.  He refused home PT. I called the patient's sister, but nobody answered the phone. All the records are reviewed and case discussed with Care Management/Social Worker. Management plans discussed with the patient, family and they are in agreement.  CODE STATUS: Full code  DVT Prophylaxis: SCDs TOTAL TIME TAKING CARE OF THIS PATIENT: 27minutes.   POSSIBLE D/C IN 1 DAYS, DEPENDING ON CLINICAL CONDITION. More than 50% time spent in counseling, coordination of care   [name] [name] M.D on 07/22/2018 at 9:57 AM  Between 7am to 6pm - Pager - [phone]  After 6pm go to [email or web address] - password [email or web address] [facility]  Office  [phone]  CC: Primary care physician; [name] Roys, DO  Note: This dictation was prepared with Dragon dictation along with smaller phrase technology. Any transcriptional errors that result from this process are unintentional.

## 2018-07-22 NOTE — Plan of Care (Signed)
  Problem: Education: Goal: Knowledge of General Education information will improve Description Including pain rating scale, medication(s)/side effects and non-pharmacologic comfort measures Outcome: Progressing   Problem: Health Behavior/Discharge Planning: Goal: Ability to manage health-related needs will improve Outcome: Progressing   Problem: Clinical Measurements: Goal: Ability to maintain clinical measurements within normal limits will improve Outcome: Progressing Goal: Will remain free from infection Outcome: Progressing Goal: Diagnostic test results will improve Outcome: Progressing Goal: Respiratory complications will improve Outcome: Progressing Goal: Cardiovascular complication will be avoided Outcome: Progressing   Problem: Pain Managment: Goal: General experience of comfort will improve Outcome: Progressing   Problem: Skin Integrity: Goal: Risk for impaired skin integrity will decrease Outcome: Progressing   Problem: Education: Goal: Ability to demonstrate management of disease process will improve Outcome: Progressing Goal: Ability to verbalize understanding of medication therapies will improve Outcome: Progressing Goal: Individualized Educational Video(s) Outcome: Progressing   Problem: Activity: Goal: Capacity to carry out activities will improve Outcome: Progressing   Problem: Cardiac: Goal: Ability to achieve and maintain adequate cardiopulmonary perfusion will improve Outcome: Progressing

## 2018-07-23 LAB — CBC
HCT: 37 % — ABNORMAL LOW (ref 39.0–52.0)
Hemoglobin: 11.2 g/dL — ABNORMAL LOW (ref 13.0–17.0)
MCH: 25.7 pg — ABNORMAL LOW (ref 26.0–34.0)
MCHC: 30.3 g/dL (ref 30.0–36.0)
MCV: 84.9 fL (ref 80.0–100.0)
Platelets: 274 10*3/uL (ref 150–400)
RBC: 4.36 MIL/uL (ref 4.22–5.81)
RDW: 21 % — ABNORMAL HIGH (ref 11.5–15.5)
WBC: 7.1 10*3/uL (ref 4.0–10.5)
nRBC: 0 % (ref 0.0–0.2)

## 2018-07-23 MED ORDER — METOPROLOL SUCCINATE ER 25 MG PO TB24
25.0000 mg | ORAL_TABLET | Freq: Two times a day (BID) | ORAL | Status: DC
  Administered 2018-07-23: 25 mg via ORAL
  Filled 2018-07-23: qty 1

## 2018-07-23 MED ORDER — TORSEMIDE 20 MG PO TABS: 40.0000 mg | ORAL_TABLET | Freq: Every day | ORAL | 0 refills | Status: DC

## 2018-07-23 MED ORDER — OXYCODONE-ACETAMINOPHEN 5-325 MG PO TABS: 1.0000 | ORAL_TABLET | Freq: Three times a day (TID) | ORAL | 0 refills | Status: DC | PRN

## 2018-07-23 MED ORDER — AMIODARONE HCL 200 MG PO TABS: 100.0000 mg | ORAL_TABLET | Freq: Two times a day (BID) | ORAL | 1 refills | Status: DC

## 2018-07-23 MED ORDER — METOPROLOL SUCCINATE ER 25 MG PO TB24: 25.0000 mg | ORAL_TABLET | Freq: Two times a day (BID) | ORAL | 0 refills | Status: DC

## 2018-07-23 NOTE — Plan of Care (Signed)
  Problem: Education: Goal: Knowledge of General Education information will improve Description Including pain rating scale, medication(s)/side effects and non-pharmacologic comfort measures Outcome: Progressing   Problem: Pain Managment: Goal: General experience of comfort will improve Outcome: Progressing   Problem: Skin Integrity: Goal: Risk for impaired skin integrity will decrease Outcome: Progressing   Problem: Education: Goal: Ability to demonstrate management of disease process will improve Outcome: Progressing   Problem: Activity: Goal: Capacity to carry out activities will improve Outcome: Progressing

## 2018-07-23 NOTE — TOC Transition Note (Signed)
Transition of Care [facility]) - CM/SW Discharge Note   Patient Details  Name: Sir Mallis MRN: [id number] Date of Birth: 1961/01/06  [facility] CM/SW Contact:  Latanya Maudlin, RN Phone Number: 07/23/2018, 1:22 PM   Clinical Narrative:   Patient to be discharged per MD order. Orders in place for home health services. SW added to orders. Patient active with [facility]. Plans for resumption. Notified Helene Kelp of discharge. Family to transport. DME was previously ordered and sent to patients home.     Final next level of care: [place] Barriers to Discharge: No Barriers Identified   Patient Goals and CMS Choice Patient states their goals for this hospitalization and ongoing recovery are:: return home with home health and PCS CMS Medicare.gov Compare Post Acute Care list provided to:: Patient Choice offered to / list presented to : Patient  Discharge Placement                       Discharge Plan and Services   Discharge Planning Services: CM Consult            DME Arranged: Walker rolling with seat DME Agency: [employer] Date DME Agency Contacted: 07/20/18 Time DME Agency Contacted: [phone]   HH Arranged: RN, PT, OT, Nurse's Aide, Social Work [employer] Agency: [employer] (now [facility] [place] [place]) Date [place]: 07/23/18 Time [place]: [place] Representative spoke with at [place]: [place] ([place]) Interventions     Readmission Risk Interventions Readmission Risk Prevention Plan 07/23/2018 07/20/2018 05/30/2018  Transportation Screening Complete Complete Complete  PCP or Specialist Appt within 3-5 Days - - Complete  [place]RI or [place] - - Complete  Social Work Consult for [place] Planning/Counseling - - Complete  Palliative Care Screening - - Not Applicable  Medication Review Press photographer) - Complete Complete  PCP or Specialist appointment within 3-5 days of discharge -  Complete -  [place] or [place] Complete Complete -  SW Recovery Care/Counseling Consult Complete Complete -  Palliative Care Screening Not Applicable Not Applicable -  [place] Not Applicable Not Applicable -

## 2018-07-23 NOTE — Progress Notes (Signed)
Pt to be discharged this afternoon. Iv and tele removed. disch instructions and prescrips given. disch via w.c. to awaiting auto

## 2018-07-23 NOTE — Progress Notes (Signed)
Patient is eager to go home.  Slightly hypotensive, relative bradycardia so I decreased the dose of metoprolol, amiodarone.  Patient states that he has no bradycardia here, moved from [place] recently, his girlfriend passed away a month ago, call twin sister is going to come and pick up him.  Advised patient to be compliant with medicines.  Discharge instructions are in the computer.  Gave a short course of pain pills, patient is requesting a referral for pain management.

## 2018-07-24 ENCOUNTER — Ambulatory Visit: Payer: Self-pay | Admitting: Licensed Clinical Social Worker

## 2018-07-24 ENCOUNTER — Telehealth: Payer: Self-pay

## 2018-07-24 NOTE — Chronic Care Management (AMB) (Signed)
  Care Management Note   [name] [name] is a 58 y.o. year old male who is a primary care patient of [name], DO. The CM team was consulted for assistance with chronic disease management and care coordination.   I reached out to [employer] by phone today. LCSW completed CCM outreach attempt today but was unable to reach patient successfully. A HIPPA compliant voice message was left encouraging patient to return call once available. LCSW rescheduled CCM SW appointment as well.  Review of patient status, including review of consultants reports, relevant laboratory and other test results, and collaboration with appropriate care team members and the patient's provider was performed as part of comprehensive patient evaluation and provision of chronic care management services.   Follow Up Plan: The care management team will reach out to the patient again next month.  [name], BSW, MSW, [facility] Care Management [email or web address] Phone: [phone]

## 2018-07-24 NOTE — Telephone Encounter (Signed)
I have made the 1st attempt to contact the patient or family member in charge, in order to follow up from recently being discharged from the hospital. I left a message on voicemail but I will make another attempt at a different time.  

## 2018-07-25 ENCOUNTER — Telehealth: Payer: Self-pay | Admitting: Family Medicine

## 2018-07-25 NOTE — Telephone Encounter (Signed)
Copied from [place] [id number]. Topic: Quick Communication - Home Health Verbal Orders >> Jul 25, 2018  2:03 PM Alanda Slim E wrote: Caller/Agency: [facility]  Callback Number: [phone] Requesting continuation of services for OT/PT/Skilled Nursing/Social Work Pt was discharged from hospital on Sunday.

## 2018-07-25 NOTE — Telephone Encounter (Signed)
Verbal orders given  

## 2018-07-25 NOTE — Telephone Encounter (Signed)
OK for verbal orders. Thanks! 

## 2018-07-26 ENCOUNTER — Telehealth: Payer: Self-pay

## 2018-07-26 NOTE — Discharge Summary (Signed)
[name] [name], is a 58 y.o. male  DOB February 26, 1960  MRN [id number].  Admission date:  07/18/2018  Admitting PhyMartin Majesticsician  Arnaldo NatalMichael S Diamond, MD  Discharge Date:  07/26/2018   Primary MD  Dorcas CarrowJohnson, Megan P, DO  Recommendations for primary care physician for things to follow:   With PCP in 1 week Follow-up with Dr. Dossie Arbourim Gollan in 1 week.   Admission Diagnosis  COPD exacerbation (HCC) [J44.1] Acute respiratory failure with hypoxia (HCC) [J96.01] Chest pain [R07.9] Acute on chronic congestive heart failure, unspecified heart failure type (HCC) [I50.9]   Discharge Diagnosis  COPD exacerbation (HCC) [J44.1] Acute respiratory failure with hypoxia (HCC) [J96.01] Chest pain [R07.9] Acute on chronic congestive heart failure, unspecified heart failure type (HCC) [I50.9]    Active Problems:   Acute on chronic systolic CHF (congestive heart failure) (HCC)   Chronic atrial fibrillation      Past Medical History:  Diagnosis Date  . Asthma   . Brain damage   . COPD (chronic obstructive pulmonary disease) (HCC)   . HFrEF (heart failure with reduced ejection fraction) (HCC)    a. 03/2018 Echo: EF 25-30%, diff HK. Mod dil LA.  [name]. Persistent atrial fibrillation    a. 03/2018 s/p DCCV; b. CHA2DS2VASc = 1-->Xarelto; c. 05/2018 recurrent afib-->Amio initiated;   . Sleep apnea     Past Surgical History:  Procedure Laterality Date  . CARDIOVERSION N/A 03/24/2018   Procedure: CARDIOVERSION;  Surgeon: Antonieta IbaGollan, Timothy J, MD;  Location: [facility] ORS;  Service: Cardiovascular;  Laterality: N/A;  . hearnia repair     X 3- total of two surgeries  . LEG SURGERY         History of present illness and  Hospital Course:     Kindly see H&P for history of present illness and admission details, please review complete Labs, Consult reports and Test  reports for all details in brief  HPI  from the history and physical done on the day of admission 58 year old male with history of chronic atrial fibrillation, morbid obesity, sleep apnea, COPD, systolic heart failure admitted because of shortness of breath, CHF exacerbation.   Hospital Course  #1 acute on chronic systolic heart failure, EF 25 to 30% by echocardiogram done in February 2020, patient received IV Lasix, improved so patient to torsemide, patient had good urine output with diuretics, discharged home with torsemide 10 mg daily.  Patient need to take extra 40 mg for 3 pound weight gain. 2.  Atypical chest pain secondary to demand ischemia no plans for ischemia work-up as per cardiology note. 3.  Persistent atrial fibrillation, patient had cardioversion in 2012, patient [place] vas score of at least 4 so started on amiodarone, beta-blockers, continued on Xarelto.  Patient had a relative bradycardia so amiodarone decreased to 200 mg p.o. twice daily. #4. does not walk very far, limited mobility because of morbid obesity and also chronic neck and back pains.. #5 hypothyroidism: Continue Synthyroid. 6.  Diabetes mellitus type 2, patient recent hemoglobin A1c 6.9. #7 .  Acute on chronic systolic heart failure, continue beta-blockers, ACE inhibitors, ARB's Doses of beta-blockers, amiodarone has been adjusted secondary to relative bradycardia, hypotension.  Patient was eager to go home on day of discharge  Pt said that  [place] moved from [place] stay with girl friend but unfortunately ,he said that his girl friend passed away recently and he has no family support here, Discharge Condition: Stable   Follow UP  Follow-up Information    [facility] Follow up on 07/28/2018.   Specialty:  Cardiology Why:  at 10:00am Contact information: [address] [place] [address] [phone]       [name], DO.  Schedule an appointment as soon as possible for a visit in 1 week(s).   Specialty:  Family Medicine Contact information: [address] [place] [address] [phone]        Antonieta Iba, MD. Schedule an appointment as soon as possible for a visit in 1 week(s).   Specialty:  Cardiology Contact information: [address] [place] [address] [phone]             Discharge Instructions  and  Discharge Medications      Allergies as of 07/23/2018   No Known Allergies     Medication List    TAKE these medications   albuterol 108 (90 Base) MCG/ACT inhaler Commonly known as:  VENTOLIN HFA Inhale 2 puffs into the lungs every 6 (six) hours as needed for wheezing or shortness of breath.   allopurinol 100 MG tablet Commonly known as:  ZYLOPRIM Take 1 tablet (100 mg total) by mouth daily.   amiodarone 200 MG tablet Commonly known as:  PACERONE Take 0.5 tablets (100 mg total) by mouth 2 (two) times daily. What changed:    how much to take  when to take this   atorvastatin 40 MG tablet Commonly known as:  LIPITOR Take 1 tablet (40 mg total) by mouth daily.   B-1 250 MG Tabs Take 1 tablet by mouth daily.   budesonide-formoterol 160-4.5 MCG/ACT inhaler Commonly known as:  SYMBICORT Inhale 2 puffs into the lungs 2 (two) times daily.   ferrous sulfate 325 (65 FE) MG tablet Take 1 tablet (325 mg total) by mouth 2 (two) times daily with a meal.   fluticasone 50 MCG/ACT nasal spray Commonly known as:  FLONASE Place 2 sprays into both nostrils 2 (two) times daily.   gabapentin 300 MG capsule Commonly known as:  NEURONTIN Take 1 capsule (300 mg total) by mouth 3 (three) times daily.   ketotifen 0.025 % ophthalmic solution Commonly known as:  ZADITOR 1 drop 2 (two) times daily.   levothyroxine 25 MCG tablet Commonly known as:  SYNTHROID Take 1 tablet (25 mcg total) by mouth daily before breakfast.   lisinopril 5 MG tablet Commonly known  as:  ZESTRIL Take 0.5 tablets (2.5 mg total) by mouth daily.   loratadine 10 MG tablet Commonly known as:  CLARITIN Take 10 mg by mouth daily.   meloxicam 15 MG tablet Commonly known as:  MOBIC Take 1 tablet (15 mg total) by mouth daily.   metoprolol succinate 25 MG 24 hr tablet Commonly known as:  TOPROL-XL Take 1 tablet (25 mg total) by mouth 2 (two) times daily. What changed:    medication strength  how much to take  additional instructions   nitroGLYCERIN 0.4 MG SL tablet Commonly known as:  NITROSTAT Place 1 tablet (0.4 mg total) under the tongue every 5 (five) minutes as needed for chest pain.   oxyCODONE-acetaminophen 5-325 MG tablet Commonly known as:  PERCOCET/ROXICET Take 1-2 tablets by mouth every 8 (eight) hours as needed for moderate pain.   potassium chloride SA 20 MEQ tablet Commonly known as:  K-DUR Take 1 tablet (20 mEq total) by mouth 2 (two) times daily.   rivaroxaban 20 MG Tabs tablet Commonly known as:  Carlena Hurl  Take 1 tablet (20 mg total) by mouth daily with supper.   spironolactone 25 MG tablet Commonly known as:  ALDACTONE Take 1 tablet (25 mg total) by mouth daily.   torsemide 20 MG tablet Commonly known as:  DEMADEX Take 2 tablets (40 mg total) by mouth daily.   traZODone 150 MG tablet Commonly known as:  DESYREL Take 1 tablet (150 mg total) by mouth at bedtime as needed for sleep.         Diet and Activity recommendation: See Discharge Instructions above   Consults obtained -cardiology   Major procedures and Radiology Reports - PLEASE review detailed and final reports for all details, in brief -      Dg Chest Port 1 View  Result Date: 07/18/2018 CLINICAL DATA:  Left-sided chest pain EXAM: PORTABLE CHEST 1 VIEW COMPARISON:  05/29/2018 FINDINGS: The cardiac silhouette is enlarged. There is mild volume overload. No pneumothorax. No large pleural effusion. No acute osseous abnormality. IMPRESSION: Cardiomegaly with mild volume  overload. Electronically Signed   By: Katherine Mantlehristopher  Green M.D.   On: 07/18/2018 01:41    Micro Results     Recent Results (from the past 240 hour(s))  SARS Coronavirus 2 (CEPHEID- Performed in [facility] lab), Hosp Order     Status: None   Collection Time: 07/18/18  1:45 AM  Result Value Ref Range Status   SARS Coronavirus 2 NEGATIVE NEGATIVE Final    Comment: (NOTE) If result is NEGATIVE SARS-CoV-2 target nucleic acids are NOT DETECTED. The SARS-CoV-2 RNA is generally detectable in upper and lower  respiratory specimens during the acute phase of infection. The lowest  concentration of SARS-CoV-2 viral copies this assay can detect is 250  copies / mL. A negative result does not preclude SARS-CoV-2 infection  and should not be used as the sole basis for treatment or other  patient management decisions.  A negative result may occur with  improper specimen collection / handling, submission of specimen other  than nasopharyngeal swab, presence of viral mutation(s) within the  areas targeted by this assay, and inadequate number of viral copies  (<250 copies / mL). A negative result must be combined with clinical  observations, patient history, and epidemiological information. If result is POSITIVE SARS-CoV-2 target nucleic acids are DETECTED. The SARS-CoV-2 RNA is generally detectable in upper and lower  respiratory specimens dur ing the acute phase of infection.  Positive  results are indicative of active infection with SARS-CoV-2.  Clinical  correlation with patient history and other diagnostic information is  necessary to determine patient infection status.  Positive results do  not rule out bacterial infection or co-infection with other viruses. If result is PRESUMPTIVE POSTIVE SARS-CoV-2 nucleic acids MAY BE PRESENT.   A presumptive positive result was obtained on the submitted specimen  and confirmed on repeat testing.  While 2019 novel coronavirus  (SARS-CoV-2)  nucleic acids may be present in the submitted sample  additional confirmatory testing may be necessary for epidemiological  and / or clinical management purposes  to differentiate between  SARS-CoV-2 and other Sarbecovirus currently known to infect humans.  If clinically indicated additional testing with an alternate test  methodology [phone] is advised. The SARS-CoV-2 RNA is generally  detectable in upper and lower respiratory sp ecimens during the acute  phase of infection. The expected result is Negative. Fact Sheet for Patients:  BoilerBrush.com.cyhttps://www.fda.gov/media/136312/download Fact Sheet for Healthcare Providers: https://pope.com/https://www.fda.gov/media/136313/download This test is not yet approved or cleared by the [place] FDA and  has been authorized for detection and/or diagnosis of SARS-CoV-2 by FDA under an Emergency Use Authorization (EUA).  This EUA will remain in effect (meaning this test can be used) for the duration of the COVID-19 declaration under Section 564(b)(1) of the Act, 21 U.S.C. section 360bbb-3(b)(1), unless the authorization is terminated or revoked sooner. Performed at [facility], [address] [place], [place] [address]        Today   Subjective:   [name] [name] today has no headache,no chest abdominal pain,no new weakness tingling or numbness, feels much better wants to go home today.   Objective:   Blood pressure (!) 99/50, pulse (!) 55, temperature 97.9 F (36.6 C), temperature source Oral, resp. rate 16, height 5\' 8"  (1.727 m), weight (!) 185.1 kg, SpO2 100 %.  No intake or output data in the 24 hours ending 07/26/18 1422  Exam Awake Alert, Oriented x 3, No new F.N deficits, Normal affect [place].AT,PERRAL Supple Neck,No JVD, No cervical lymphadenopathy appriciated.  Symmetrical Chest wall movement, Good air movement bilaterally, CTAB RRR,No Gallops,Rubs or new Murmurs, No Parasternal Heave +ve B.Sounds, Abd Soft, Non tender, No organomegaly  appriciated, No rebound -guarding or rigidity. No Cyanosis, Clubbing or edema, No new Rash or bruise  Data Review   CBC w Diff:  Lab Results  Component Value Date   WBC 7.1 07/23/2018   HGB 11.2 (L) 07/23/2018   HGB 11.1 (L) 07/04/2018   HCT 37.0 (L) 07/23/2018   HCT 37.1 (L) 07/04/2018   PLT 274 07/23/2018   PLT 267 07/04/2018   LYMPHOPCT 19 07/17/2018   MONOPCT 12 07/17/2018   EOSPCT 1 07/17/2018   BASOPCT 1 07/17/2018    CMP:  Lab Results  Component Value Date   NA 134 (L) 07/22/2018   NA 138 07/04/2018   K 4.3 07/22/2018   CL 101 07/22/2018   CO2 24 07/22/2018   BUN 47 (H) 07/22/2018   BUN 19 07/04/2018   CREATININE 1.28 (H) 07/22/2018   PROT 7.0 07/17/2018   PROT 7.3 07/04/2018   ALBUMIN 3.5 07/17/2018   ALBUMIN 4.2 07/04/2018   BILITOT 0.7 07/17/2018   BILITOT 0.3 07/04/2018   ALKPHOS 78 07/17/2018   AST 20 07/17/2018   ALT 10 07/17/2018  .   Total Time in preparing paper work, data evaluation and todays exam - 35 minutes  Epifanio Lesches M.D on 07/23/2018 at 2:22 PM    Note: This dictation was prepared with Dragon dictation along with smaller phrase technology. Any transcriptional errors that result from this process are unintentional.

## 2018-07-28 ENCOUNTER — Telehealth: Payer: Medicaid Other | Admitting: Family

## 2018-07-28 ENCOUNTER — Ambulatory Visit: Payer: Self-pay | Admitting: Licensed Clinical Social Worker

## 2018-07-28 ENCOUNTER — Ambulatory Visit: Payer: Self-pay | Admitting: Pharmacist

## 2018-07-28 ENCOUNTER — Telehealth: Payer: Self-pay | Admitting: Family Medicine

## 2018-07-28 DIAGNOSIS — G894 Chronic pain syndrome: Secondary | ICD-10-CM

## 2018-07-28 DIAGNOSIS — I5022 Chronic systolic (congestive) heart failure: Secondary | ICD-10-CM

## 2018-07-28 DIAGNOSIS — F4321 Adjustment disorder with depressed mood: Secondary | ICD-10-CM

## 2018-07-28 NOTE — Telephone Encounter (Signed)
OK to give verbal orders if needed. If this was just an FYI- noted

## 2018-07-28 NOTE — Chronic Care Management (AMB) (Signed)
Chronic Care Management   Follow Up Note   07/28/2018 Name: [name] [name] MRN: [id number] DOB: April 30, 1960  Referred by: Dorcas Carrow, DO Reason for referral : Chronic Care Management (Medication Management)   Seledonio [name] is a 58 y.o. year old male who is a primary care patient of Dorcas Carrow, DO. The CCM team was consulted for assistance with chronic disease management and care coordination needs.    Contacted patient to follow up on medication management s/p hospitalization.   Review of patient status, including review of consultants reports, relevant laboratory and other test results, and collaboration with appropriate care team members and the patient's provider was performed as part of comprehensive patient evaluation and provision of chronic care management services.    Goals Addressed            This Visit's Progress     Patient Stated   . "I'm in pain and depressed" (pt-stated)       Current Barriers:  . Patient endorses significant depression, especially since the passing of his significant other in April, complicated by chronic pain . He saw Pain Clinic earlier this month, but did not think that the interventions recommended by Dr. Cherylann Ratel would be beneficial for him. He notes cortisone shots have been most effective previously o One recommendation of Dr. Garnett Farm included duloxetine for chronic pain . He endorses continued depression, apathy towards taking his medications, and discouragement about his health  Pharmacist Clinical Goal(s):  [name] Over the next 90 days, patient will work with CCM team and primary care provider to address needs related to management of chronic pain and depression  Interventions: . Contacted [name], [place], asked to contacted patient today to assess mental state and current depressive control; also will be assessing any financial support services so that patient can afford his medications . Alerted Dr. Laural Benes to patient's  depressive language.  . Consider addition of duloxetine to gabapentin to target chronic pain and depression. Discussed this with patient, he was amenable to taking a medication to help with his depression  Patient Self Care Activities:  . Self administers medications as prescribed  Initial goal documentation     . "I'm on a lot of medications" (pt-stated)       Current Barriers:  [name] Knowledge Deficits related to optimal medication management . Literacy Barriers - is able to spell out medication names to me and instructions over the phone, but reads slowly  . Medication Management - previously, provided patient a pill box to help organize medications. However, he notes that he has not used it. He associates pill boxes with negative connotations of his parents being sick and passing away, and he doesn't want to fill a pill box . Financial Barriers - previously denied problems affording medications, but notes that he doesn't have $3 to pick up his amiodarone today. He also notes that he has had some confrontations with [employer], and is interested in transferring to another pharmacy  . Patient canceled appointment with Heart Failure clinic today, as he previously had a negative interaction with the clinic  Pharmacist Clinical Goal(s):  [name] Over the next 90 days, patient will work with CCM team to address needs related to optimized medication management  Interventions: . Contacted [facility]; packaging is free and delivery will be free due to him living <3 miles from the store. With his permission, asked [facility] to transfer all medications from [facility]. We will hold off on pill packing until after  he has seen cardiology to ensure optimal management of his heart failure medications. Updated preferred pharmacy in [place] . [place] RN; will coordinate trying to get patient scheduled for follow up with their office  . Encouraged patient to bring all medications to his  appointment with Dr. Wynetta Emery at the end of the month  Patient Self Care Activities:  . Calls pharmacy for medication refills . Calls provider office for new concerns or questions  Please see past updates related to this goal by clicking on the "Past Updates" button in the selected goal         Plan:  - CCM team will continue to outreach the patient over the next 2-3 weeks for continued support   [name], PharmD Clinical Pharmacist [place] [phone]

## 2018-07-28 NOTE — Patient Instructions (Signed)
Visit Information  Goals Addressed            This Visit's Progress     Patient Stated   . "I'm in pain and depressed" (pt-stated)       Current Barriers:  . Patient endorses significant depression, especially since the passing of his significant other in April, complicated by chronic pain . He saw Pain Clinic earlier this month, but did not think that the interventions recommended by Dr. Holley Raring would be beneficial for him. He notes cortisone shots have been most effective previously o One recommendation of Dr. Elwyn Lade included duloxetine for chronic pain . He endorses continued depression, apathy towards taking his medications, and discouragement about his health  Pharmacist Clinical Goal(s):  [name] Over the next 90 days, patient will work with CCM team and primary care provider to address needs related to management of chronic pain and depression  Interventions: . Contacted [name], [place], asked to contacted patient today to assess mental state and current depressive control; also will be assessing any financial support services so that patient can afford his medications . Alerted Dr. Wynetta Emery to patient's depressive language.  . Consider addition of duloxetine to gabapentin to target chronic pain and depression. Discussed this with patient, he was amenable to taking a medication to help with his depression  Patient Self Care Activities:  . Self administers medications as prescribed  Initial goal documentation     . "I'm on a lot of medications" (pt-stated)       Current Barriers:  [name] Knowledge Deficits related to optimal medication management . Literacy Barriers - is able to spell out medication names to me and instructions over the phone, but reads slowly  . Medication Management - previously, provided patient a pill box to help organize medications. However, he notes that he has not used it. He associates pill boxes with negative connotations of his parents being sick and  passing away, and he doesn't want to fill a pill box . Financial Barriers - previously denied problems affording medications, but notes that he doesn't have $3 to pick up his amiodarone today. He also notes that he has had some confrontations with [employer], and is interested in transferring to another pharmacy  . Patient canceled appointment with Heart Failure clinic today, as he previously had a negative interaction with the clinic  Pharmacist Clinical Goal(s):  [name] Over the next 90 days, patient will work with CCM team to address needs related to optimized medication management  Interventions: . Contacted [facility]; packaging is free and delivery will be free due to him living <3 miles from the store. With his permission, asked [facility] to transfer all medications from [facility]. We will hold off on pill packing until after he has seen cardiology to ensure optimal management of his heart failure medications. Updated preferred pharmacy in [place] . [place] RN; will coordinate trying to get patient scheduled for follow up with their office  . Encouraged patient to bring all medications to his appointment with Dr. Wynetta Emery at the end of the month  Patient Self Care Activities:  . Calls pharmacy for medication refills . Calls provider office for new concerns or questions  Please see past updates related to this goal by clicking on the "Past Updates" button in the selected goal         The patient verbalized understanding of instructions provided today and declined a print copy of patient instruction materials.     Plan:  -  CCM team will continue to outreach the patient over the next 2-3 weeks for continued support   Catie Feliz Beam, PharmD Clinical Pharmacist [facility] [phone]

## 2018-07-28 NOTE — Chronic Care Management (AMB) (Signed)
  Care Management   Follow Up Note   07/28/2018 Name: [name] [name] MRN: [id number] DOB: [birth year]  Referred by: [name], DO Reason for referral : Chronic Care Management   [name] [name] is a 58 y.o. year old male who is a primary care patient of [name], DO. The care management team was consulted for assistance with care management and care coordination needs.    Review of patient status, including review of consultants reports, relevant laboratory and other test results, and collaboration with appropriate care team members and the patient's provider was performed as part of comprehensive patient evaluation and provision of chronic care management services.    Goals Addressed    . "I need help adjusting to loss of my spouse" (pt-stated)       Current Barriers:  . Financial constraints . Limited social support . ADL IADL limitations . Mental Health Concerns  . Social Isolation . Limited education about available grief support resources within the area* . Limited access to caregiver  Clinical Social Work Clinical Goal(s):  [name] Over the next 90 days, client will work with SW to address concerns related to grief (loss of spouse last month) . Over the next 90 days, patient will work with PCP  to address needs related to lack of care/support within the home and need for personal care services  Interventions: . Patient interviewed and appropriate assessments performed . Provided mental health counseling with regard to grief support and assessed suicidal ideations. Patient reported to [facility] Pharmacist "that it would be easier to get shot with a bullet than to take care of his health and manage his medications" . Patient denies needing crisis intervention right now. Patient denies needing a wellness check at this time. He states that he does NOT wish to cause himself or anyone else harm but is dealing with chronic depression, chronic pain and grief all at once. Patient was  provided Crisis Service Resource education and patient agreed to contact [employer]/Mobile Crisis number at [phone] or 911 if suicidal/homicidal ideations occur.  . Discussed plans with patient for ongoing care management follow up and provided patient with direct contact information for care management team . Collaborated with primary care provider and pharmacist re: Patient is requesting PCS services now that spouse has passed away as she was his primary caregiver. Patient has Medicaid. PCS application request made. LCSW completed call to [facility] and was informed that application was needed additional information (specifically NPI number) and that his application was faxed back to PCP office on 07/21/2018 but that they have not heard back. PCP will be updated. [name] Collaborated with [employer] (community agency) re: individual counseling referral for grief support. Patient is now agreeable to services. Referral made on 07/28/2018. [name] Assisted patient/caregiver with obtaining information about health plan benefits  Patient Self Care Activities:  . Attends all scheduled provider appointments . Calls provider office for new concerns or questions  Please see past updates related to this goal by clicking on the "Past Updates" button in the selected goal          The care management team will reach out to the patient again over the next 2-4 weeks days.   [name], BSW, MSW, [facility] Care Management [email or web address] Phone: [phone]

## 2018-07-28 NOTE — Telephone Encounter (Signed)
Copied from [place] [id number]. Topic: Quick Communication - Home Health Verbal Orders >> Jul 28, 2018 11:12 AM Erick Blinks wrote: Caller/Agency: Webb Silversmith [facility] Callback Number: [phone] Requesting OT/PT/Skilled Nursing/Social Work/Speech Therapy: Social work -pt resumed with home health services yesterday. Tried to reach him for social work visit today but was unable. Visit will be delayed until next week

## 2018-08-01 ENCOUNTER — Telehealth: Payer: Self-pay | Admitting: Family Medicine

## 2018-08-01 DIAGNOSIS — I872 Venous insufficiency (chronic) (peripheral): Secondary | ICD-10-CM | POA: Diagnosis not present

## 2018-08-01 NOTE — Telephone Encounter (Signed)
Social worker notified

## 2018-08-01 NOTE — Telephone Encounter (Signed)
Copied from [place] [id number]. Topic: Quick Communication - Home Health Verbal Orders >> Aug 01, 2018  9:30 AM Scherrie Gerlach wrote: Caller/Agency: Webb Silversmith // [facility] Callback Number: [phone] Webb Silversmith is Education officer, museum. Saw pt yesterday. Pt in need of additional home health service under medicaid. She will fax referral form for dr to complete and fax that form to [facility].  Pt will then be accessed for the program

## 2018-08-01 NOTE — Telephone Encounter (Signed)
We have already filled out this form. It should have been faxed yesterday.

## 2018-08-04 ENCOUNTER — Ambulatory Visit: Payer: Self-pay | Admitting: Pharmacist

## 2018-08-04 DIAGNOSIS — I5022 Chronic systolic (congestive) heart failure: Secondary | ICD-10-CM

## 2018-08-04 NOTE — Patient Instructions (Signed)
Visit Information  Goals Addressed            This Visit's Progress     Patient Stated   . "I'm on a lot of medications" (pt-stated)       Current Barriers:  [name] Knowledge Deficits related to optimal medication management . Literacy Barriers - is able to spell out medication names to me and instructions over the phone, but reads slowly  . Medication Management - previously, provided patient a pill box to help organize medications. However, he notes that he has not used it. He associates pill boxes with negative connotations of his parents being sick and passing away, and he doesn't want to fill a pill box . Financial Barriers - previously denied problems affording medications, but notes that he doesn't have $3 to pick up his amiodarone today. He also notes that he has had some confrontations with [employer], and is interested in transferring to another pharmacy  . Patient canceled appointment with Heart Failure clinic last week, as he previously had a negative interaction with the clinic . Patient was supposed to have follow up with cardiology ~1 week after discharge, but this was never scheduled.   Pharmacist Clinical Goal(s):  [name] Over the next 90 days, patient will work with CCM team to address needs related to optimized medication management  Interventions: . [place], spoke with [place]; asked that they contact patient to schedule an appointment with the office as soon as possible. Gabriel Cirri noted that she would contact the patient to schedule him to be seen as soon as possible.  Patient Self Care Activities:  . Calls pharmacy for medication refills . Calls provider office for new concerns or questions  Please see past updates related to this goal by clicking on the "Past Updates" button in the selected goal         The patient verbalized understanding of instructions provided today and declined a print copy of patient instruction materials.   Plan:  -  PharmD will meet with patient when he comes to primary care provider appointment on 08/15/2018 to review medication management  [name], PharmD Clinical Pharmacist [place] [phone]

## 2018-08-04 NOTE — Chronic Care Management (AMB) (Signed)
  Chronic Care Management   Follow Up Note   08/04/2018 Name: [name] [name] MRN: [id number] DOB: 1960-07-13  Referred by: [name], DO Reason for referral : Chronic Care Management (Medication Management)   [name] [name] is a 58 y.o. year old male who is a primary care patient of [name], DO. The CCM team was consulted for assistance with chronic disease management and care coordination needs.    Care coordination completed today.   Review of patient status, including review of consultants reports, relevant laboratory and other test results, and collaboration with appropriate care team members and the patient's provider was performed as part of comprehensive patient evaluation and provision of chronic care management services.    Goals Addressed            This Visit's Progress     Patient Stated   . "I'm on a lot of medications" (pt-stated)       Current Barriers:  [name] Knowledge Deficits related to optimal medication management . Literacy Barriers - is able to spell out medication names to me and instructions over the phone, but reads slowly  . Medication Management - previously, provided patient a pill box to help organize medications. However, he notes that he has not used it. He associates pill boxes with negative connotations of his parents being sick and passing away, and he doesn't want to fill a pill box . Financial Barriers - previously denied problems affording medications, but notes that he doesn't have $3 to pick up his amiodarone today. He also notes that he has had some confrontations with [employer], and is interested in transferring to another pharmacy  . Patient canceled appointment with Heart Failure clinic last week, as he previously had a negative interaction with the clinic . Patient was supposed to have follow up with cardiology ~1 week after discharge, but this was never scheduled.   Pharmacist Clinical Goal(s):  [name] Over the next 90  days, patient will work with CCM team to address needs related to optimized medication management  Interventions: . [place], spoke with [place]; asked that they contact patient to schedule an appointment with the office as soon as possible. Gabriel Cirri noted that she would contact the patient to schedule him to be seen as soon as possible.  Patient Self Care Activities:  . Calls pharmacy for medication refills . Calls provider office for new concerns or questions  Please see past updates related to this goal by clicking on the "Past Updates" button in the selected goal          Plan:  - PharmD will meet with patient when he comes to primary care provider appointment on 08/15/2018 to review medication management  [name], PharmD Clinical Pharmacist [place] [phone]

## 2018-08-06 ENCOUNTER — Emergency Department: Payer: Medicaid Other

## 2018-08-06 ENCOUNTER — Other Ambulatory Visit: Payer: Self-pay

## 2018-08-06 ENCOUNTER — Inpatient Hospital Stay
Admission: EM | Admit: 2018-08-06 | Discharge: 2018-08-10 | DRG: 292 | Disposition: A | Discharge status: Home Health | Payer: Medicaid Other | Attending: Internal Medicine | Admitting: Internal Medicine

## 2018-08-06 DIAGNOSIS — I428 Other cardiomyopathies: Secondary | ICD-10-CM | POA: Diagnosis present

## 2018-08-06 DIAGNOSIS — Z79890 Hormone replacement therapy (postmenopausal): Secondary | ICD-10-CM

## 2018-08-06 DIAGNOSIS — M545 Low back pain: Secondary | ICD-10-CM | POA: Diagnosis present

## 2018-08-06 DIAGNOSIS — R079 Chest pain, unspecified: Secondary | ICD-10-CM | POA: Diagnosis not present

## 2018-08-06 DIAGNOSIS — J80 Acute respiratory distress syndrome: Secondary | ICD-10-CM | POA: Diagnosis not present

## 2018-08-06 DIAGNOSIS — Z7951 Long term (current) use of inhaled steroids: Secondary | ICD-10-CM

## 2018-08-06 DIAGNOSIS — I110 Hypertensive heart disease with heart failure: Secondary | ICD-10-CM | POA: Diagnosis not present

## 2018-08-06 DIAGNOSIS — I440 Atrioventricular block, first degree: Secondary | ICD-10-CM | POA: Diagnosis present

## 2018-08-06 DIAGNOSIS — I447 Left bundle-branch block, unspecified: Secondary | ICD-10-CM | POA: Diagnosis present

## 2018-08-06 DIAGNOSIS — I213 ST elevation (STEMI) myocardial infarction of unspecified site: Secondary | ICD-10-CM | POA: Diagnosis not present

## 2018-08-06 DIAGNOSIS — G8929 Other chronic pain: Secondary | ICD-10-CM | POA: Diagnosis present

## 2018-08-06 DIAGNOSIS — R946 Abnormal results of thyroid function studies: Secondary | ICD-10-CM | POA: Diagnosis present

## 2018-08-06 DIAGNOSIS — I5043 Acute on chronic combined systolic (congestive) and diastolic (congestive) heart failure: Secondary | ICD-10-CM | POA: Diagnosis not present

## 2018-08-06 DIAGNOSIS — D649 Anemia, unspecified: Secondary | ICD-10-CM | POA: Diagnosis present

## 2018-08-06 DIAGNOSIS — I248 Other forms of acute ischemic heart disease: Secondary | ICD-10-CM | POA: Diagnosis present

## 2018-08-06 DIAGNOSIS — R0602 Shortness of breath: Secondary | ICD-10-CM | POA: Diagnosis not present

## 2018-08-06 DIAGNOSIS — K625 Hemorrhage of anus and rectum: Secondary | ICD-10-CM | POA: Diagnosis present

## 2018-08-06 DIAGNOSIS — Z791 Long term (current) use of non-steroidal anti-inflammatories (NSAID): Secondary | ICD-10-CM

## 2018-08-06 DIAGNOSIS — I878 Other specified disorders of veins: Secondary | ICD-10-CM | POA: Diagnosis present

## 2018-08-06 DIAGNOSIS — Z6841 Body Mass Index (BMI) 40.0 and over, adult: Secondary | ICD-10-CM

## 2018-08-06 DIAGNOSIS — I509 Heart failure, unspecified: Secondary | ICD-10-CM

## 2018-08-06 DIAGNOSIS — M25511 Pain in right shoulder: Secondary | ICD-10-CM | POA: Diagnosis not present

## 2018-08-06 DIAGNOSIS — J441 Chronic obstructive pulmonary disease with (acute) exacerbation: Secondary | ICD-10-CM | POA: Diagnosis present

## 2018-08-06 DIAGNOSIS — L97929 Non-pressure chronic ulcer of unspecified part of left lower leg with unspecified severity: Secondary | ICD-10-CM | POA: Diagnosis present

## 2018-08-06 DIAGNOSIS — Z79899 Other long term (current) drug therapy: Secondary | ICD-10-CM

## 2018-08-06 DIAGNOSIS — F329 Major depressive disorder, single episode, unspecified: Secondary | ICD-10-CM | POA: Diagnosis present

## 2018-08-06 DIAGNOSIS — Z7901 Long term (current) use of anticoagulants: Secondary | ICD-10-CM

## 2018-08-06 DIAGNOSIS — Z86718 Personal history of other venous thrombosis and embolism: Secondary | ICD-10-CM

## 2018-08-06 DIAGNOSIS — R7301 Impaired fasting glucose: Secondary | ICD-10-CM | POA: Diagnosis present

## 2018-08-06 DIAGNOSIS — G4733 Obstructive sleep apnea (adult) (pediatric): Secondary | ICD-10-CM | POA: Diagnosis present

## 2018-08-06 DIAGNOSIS — I4819 Other persistent atrial fibrillation: Secondary | ICD-10-CM | POA: Diagnosis present

## 2018-08-06 DIAGNOSIS — Z801 Family history of malignant neoplasm of trachea, bronchus and lung: Secondary | ICD-10-CM | POA: Diagnosis not present

## 2018-08-06 DIAGNOSIS — M542 Cervicalgia: Secondary | ICD-10-CM | POA: Diagnosis present

## 2018-08-06 DIAGNOSIS — I872 Venous insufficiency (chronic) (peripheral): Secondary | ICD-10-CM | POA: Diagnosis present

## 2018-08-06 DIAGNOSIS — Z8249 Family history of ischemic heart disease and other diseases of the circulatory system: Secondary | ICD-10-CM | POA: Diagnosis not present

## 2018-08-06 DIAGNOSIS — Z20828 Contact with and (suspected) exposure to other viral communicable diseases: Secondary | ICD-10-CM | POA: Diagnosis not present

## 2018-08-06 DIAGNOSIS — R069 Unspecified abnormalities of breathing: Secondary | ICD-10-CM | POA: Diagnosis not present

## 2018-08-06 DIAGNOSIS — S81801A Unspecified open wound, right lower leg, initial encounter: Secondary | ICD-10-CM | POA: Diagnosis not present

## 2018-08-06 DIAGNOSIS — E119 Type 2 diabetes mellitus without complications: Secondary | ICD-10-CM | POA: Diagnosis not present

## 2018-08-06 DIAGNOSIS — J449 Chronic obstructive pulmonary disease, unspecified: Secondary | ICD-10-CM | POA: Diagnosis not present

## 2018-08-06 DIAGNOSIS — Z87820 Personal history of traumatic brain injury: Secondary | ICD-10-CM

## 2018-08-06 DIAGNOSIS — I5023 Acute on chronic systolic (congestive) heart failure: Secondary | ICD-10-CM | POA: Diagnosis not present

## 2018-08-06 DIAGNOSIS — I4891 Unspecified atrial fibrillation: Secondary | ICD-10-CM | POA: Diagnosis not present

## 2018-08-06 HISTORY — DX: Depression, unspecified: F32.A

## 2018-08-06 LAB — COMPREHENSIVE METABOLIC PANEL
ALT: 11 U/L (ref 0–44)
AST: 19 U/L (ref 15–41)
Albumin: 3.5 g/dL (ref 3.5–5.0)
Alkaline Phosphatase: 74 U/L (ref 38–126)
Anion gap: 13 (ref 5–15)
BUN: 16 mg/dL (ref 6–20)
CO2: 22 mmol/L (ref 22–32)
Calcium: 8.3 mg/dL — ABNORMAL LOW (ref 8.9–10.3)
Chloride: 103 mmol/L (ref 98–111)
Creatinine, Ser: 0.95 mg/dL (ref 0.61–1.24)
GFR calc Af Amer: >60 mL/min (ref >60)
GFR calc non Af Amer: >60 mL/min (ref >60)
Glucose, Bld: 129 mg/dL — ABNORMAL HIGH (ref 70–99)
Potassium: 4.2 mmol/L (ref 3.5–5.1)
Sodium: 138 mmol/L (ref 135–145)
Total Bilirubin: 0.3 mg/dL (ref 0.3–1.2)
Total Protein: 7.1 g/dL (ref 6.5–8.1)

## 2018-08-06 LAB — CBC WITH DIFFERENTIAL/PLATELET
Abs Immature Granulocytes: 0.02 10*3/uL (ref 0.00–0.07)
Basophils Absolute: 0.1 10*3/uL (ref 0.0–0.1)
Basophils Relative: 1 %
Eosinophils Absolute: 0.1 10*3/uL (ref 0.0–0.5)
Eosinophils Relative: 1 %
HCT: 36.8 % — ABNORMAL LOW (ref 39.0–52.0)
Hemoglobin: 11.2 g/dL — ABNORMAL LOW (ref 13.0–17.0)
Immature Granulocytes: 0 %
Lymphocytes Relative: 8 %
Lymphs Abs: 0.7 10*3/uL (ref 0.7–4.0)
MCH: 25.9 pg — ABNORMAL LOW (ref 26.0–34.0)
MCHC: 30.4 g/dL (ref 30.0–36.0)
MCV: 85 fL (ref 80.0–100.0)
Monocytes Absolute: 0.7 10*3/uL (ref 0.1–1.0)
Monocytes Relative: 8 %
Neutro Abs: 6.7 10*3/uL (ref 1.7–7.7)
Neutrophils Relative %: 82 %
Platelets: 251 10*3/uL (ref 150–400)
RBC: 4.33 MIL/uL (ref 4.22–5.81)
RDW: 22 % — ABNORMAL HIGH (ref 11.5–15.5)
Smear Review: NORMAL
WBC: 8.2 10*3/uL (ref 4.0–10.5)
nRBC: 0 % (ref 0.0–0.2)

## 2018-08-06 LAB — PROTIME-INR
INR: 1.1 (ref 0.8–1.2)
Prothrombin Time: 13.9 seconds (ref 11.4–15.2)

## 2018-08-06 LAB — BRAIN NATRIURETIC PEPTIDE: B Natriuretic Peptide: 173 pg/mL — ABNORMAL HIGH (ref 0.0–100.0)

## 2018-08-06 LAB — SARS CORONAVIRUS 2 BY RT PCR (HOSPITAL ORDER, PERFORMED IN ~~LOC~~ HOSPITAL LAB): SARS Coronavirus 2: NEGATIVE

## 2018-08-06 LAB — TROPONIN I
Troponin I: <0.03 ng/mL (ref <0.03)
Troponin I: 0.04 ng/mL (ref <0.03)

## 2018-08-06 LAB — HEPARIN LEVEL (UNFRACTIONATED): Heparin Unfractionated: 0.11 IU/mL — ABNORMAL LOW (ref 0.30–0.70)

## 2018-08-06 LAB — APTT: aPTT: 25 seconds (ref 24–36)

## 2018-08-06 LAB — GLUCOSE, CAPILLARY: Glucose-Capillary: 103 mg/dL — ABNORMAL HIGH (ref 70–99)

## 2018-08-06 MED ORDER — NITROGLYCERIN 0.4 MG SL SUBL: 0.4000 mg | SUBLINGUAL_TABLET | SUBLINGUAL | Status: DC | PRN

## 2018-08-06 MED ORDER — METOPROLOL SUCCINATE ER 25 MG PO TB24
25.0000 mg | ORAL_TABLET | Freq: Two times a day (BID) | ORAL | Status: DC
  Administered 2018-08-06 – 2018-08-10 (×8): 25 mg via ORAL
  Filled 2018-08-06 (×8): qty 1

## 2018-08-06 MED ORDER — ALBUTEROL SULFATE (2.5 MG/3ML) 0.083% IN NEBU: 3.0000 mL | INHALATION_SOLUTION | Freq: Four times a day (QID) | RESPIRATORY_TRACT | Status: DC | PRN

## 2018-08-06 MED ORDER — FERROUS SULFATE 325 (65 FE) MG PO TABS
325.0000 mg | ORAL_TABLET | Freq: Two times a day (BID) | ORAL | Status: DC
  Administered 2018-08-07 – 2018-08-10 (×7): 325 mg via ORAL
  Filled 2018-08-06 (×7): qty 1

## 2018-08-06 MED ORDER — GABAPENTIN 300 MG PO CAPS
300.0000 mg | ORAL_CAPSULE | Freq: Three times a day (TID) | ORAL | Status: DC
  Administered 2018-08-06 – 2018-08-10 (×11): 300 mg via ORAL
  Filled 2018-08-06 (×11): qty 1

## 2018-08-06 MED ORDER — MORPHINE SULFATE (PF) 2 MG/ML IV SOLN
2.0000 mg | Freq: Once | INTRAVENOUS | Status: AC
  Administered 2018-08-06: 2 mg via INTRAVENOUS
  Filled 2018-08-06: qty 1

## 2018-08-06 MED ORDER — FLUTICASONE PROPIONATE 50 MCG/ACT NA SUSP
2.0000 | Freq: Two times a day (BID) | NASAL | Status: DC
  Administered 2018-08-09 – 2018-08-10 (×2): 2 via NASAL
  Filled 2018-08-06 (×2): qty 16

## 2018-08-06 MED ORDER — MORPHINE SULFATE (PF) 2 MG/ML IV SOLN: 2.0000 mg | INTRAVENOUS | Status: DC | PRN

## 2018-08-06 MED ORDER — INSULIN ASPART 100 UNIT/ML ~~LOC~~ SOLN
0.0000 [IU] | Freq: Three times a day (TID) | SUBCUTANEOUS | Status: DC
  Administered 2018-08-09: 1 [IU] via SUBCUTANEOUS
  Filled 2018-08-06: qty 1

## 2018-08-06 MED ORDER — VITAMIN B-1 100 MG PO TABS
100.0000 mg | ORAL_TABLET | Freq: Every day | ORAL | Status: DC
  Administered 2018-08-06 – 2018-08-10 (×5): 100 mg via ORAL
  Filled 2018-08-06 (×5): qty 1

## 2018-08-06 MED ORDER — HEPARIN (PORCINE) 25000 UT/250ML-% IV SOLN
1900.0000 [IU]/h | INTRAVENOUS | Status: DC
  Administered 2018-08-06: 23:00:00 1600 [IU]/h via INTRAVENOUS
  Filled 2018-08-06: qty 250

## 2018-08-06 MED ORDER — OXYCODONE HCL 5 MG PO TABS
5.0000 mg | ORAL_TABLET | ORAL | Status: DC | PRN
  Administered 2018-08-06 – 2018-08-10 (×9): 5 mg via ORAL
  Filled 2018-08-06 (×9): qty 1

## 2018-08-06 MED ORDER — VANCOMYCIN HCL 10 G IV SOLR
1750.0000 mg | Freq: Two times a day (BID) | INTRAVENOUS | Status: DC
  Administered 2018-08-07 (×2): 1750 mg via INTRAVENOUS
  Filled 2018-08-06 (×4): qty 1750

## 2018-08-06 MED ORDER — ACETAMINOPHEN 650 MG RE SUPP: 650.0000 mg | Freq: Four times a day (QID) | RECTAL | Status: DC | PRN

## 2018-08-06 MED ORDER — HEPARIN (PORCINE) 25000 UT/250ML-% IV SOLN: 1600.0000 [IU]/h | INTRAVENOUS | Status: DC

## 2018-08-06 MED ORDER — LEVOTHYROXINE SODIUM 25 MCG PO TABS
25.0000 ug | ORAL_TABLET | Freq: Every day | ORAL | Status: DC
  Administered 2018-08-07 – 2018-08-10 (×4): 25 ug via ORAL
  Filled 2018-08-06 (×4): qty 1

## 2018-08-06 MED ORDER — VANCOMYCIN HCL 10 G IV SOLR
2500.0000 mg | Freq: Once | INTRAVENOUS | Status: AC
  Administered 2018-08-06: 21:00:00 2500 mg via INTRAVENOUS
  Filled 2018-08-06: qty 2500

## 2018-08-06 MED ORDER — MOMETASONE FURO-FORMOTEROL FUM 200-5 MCG/ACT IN AERO
2.0000 | INHALATION_SPRAY | Freq: Two times a day (BID) | RESPIRATORY_TRACT | Status: DC
  Administered 2018-08-07 – 2018-08-10 (×7): 2 via RESPIRATORY_TRACT
  Filled 2018-08-06: qty 8.8

## 2018-08-06 MED ORDER — ONDANSETRON HCL 4 MG/2ML IJ SOLN: 4.0000 mg | Freq: Four times a day (QID) | INTRAMUSCULAR | Status: DC | PRN

## 2018-08-06 MED ORDER — ONDANSETRON HCL 4 MG PO TABS: 4.0000 mg | ORAL_TABLET | Freq: Four times a day (QID) | ORAL | Status: DC | PRN

## 2018-08-06 MED ORDER — ALLOPURINOL 100 MG PO TABS
100.0000 mg | ORAL_TABLET | Freq: Every day | ORAL | Status: DC
  Administered 2018-08-07 – 2018-08-10 (×4): 100 mg via ORAL
  Filled 2018-08-06 (×5): qty 1

## 2018-08-06 MED ORDER — SPIRONOLACTONE 25 MG PO TABS
25.0000 mg | ORAL_TABLET | Freq: Every day | ORAL | Status: DC
  Administered 2018-08-07 – 2018-08-10 (×4): 25 mg via ORAL
  Filled 2018-08-06 (×5): qty 1

## 2018-08-06 MED ORDER — FUROSEMIDE 10 MG/ML IJ SOLN
40.0000 mg | Freq: Two times a day (BID) | INTRAMUSCULAR | Status: DC
  Administered 2018-08-06 – 2018-08-09 (×6): 40 mg via INTRAVENOUS
  Filled 2018-08-06 (×6): qty 4

## 2018-08-06 MED ORDER — POTASSIUM CHLORIDE CRYS ER 20 MEQ PO TBCR
20.0000 meq | EXTENDED_RELEASE_TABLET | Freq: Two times a day (BID) | ORAL | Status: DC
  Administered 2018-08-06 – 2018-08-10 (×8): 20 meq via ORAL
  Filled 2018-08-06 (×8): qty 1

## 2018-08-06 MED ORDER — TRAZODONE HCL 50 MG PO TABS
150.0000 mg | ORAL_TABLET | Freq: Every evening | ORAL | Status: DC | PRN
  Administered 2018-08-06 – 2018-08-10 (×4): 150 mg via ORAL
  Filled 2018-08-06 (×3): qty 1

## 2018-08-06 MED ORDER — SODIUM CHLORIDE 0.9 % IV SOLN
2.0000 g | Freq: Three times a day (TID) | INTRAVENOUS | Status: DC
  Administered 2018-08-06 – 2018-08-08 (×5): 2 g via INTRAVENOUS
  Filled 2018-08-06 (×8): qty 2

## 2018-08-06 MED ORDER — LORATADINE 10 MG PO TABS
10.0000 mg | ORAL_TABLET | Freq: Every day | ORAL | Status: DC
  Administered 2018-08-06 – 2018-08-10 (×5): 10 mg via ORAL
  Filled 2018-08-06 (×5): qty 1

## 2018-08-06 MED ORDER — ONDANSETRON HCL 4 MG/2ML IJ SOLN
4.0000 mg | Freq: Once | INTRAMUSCULAR | Status: AC
  Administered 2018-08-06: 4 mg via INTRAVENOUS
  Filled 2018-08-06: qty 2

## 2018-08-06 MED ORDER — LISINOPRIL 5 MG PO TABS
2.5000 mg | ORAL_TABLET | Freq: Every day | ORAL | Status: DC
  Administered 2018-08-07: 2.5 mg via ORAL
  Filled 2018-08-06: qty 1

## 2018-08-06 MED ORDER — FUROSEMIDE 10 MG/ML IJ SOLN
60.0000 mg | Freq: Once | INTRAMUSCULAR | Status: AC
  Administered 2018-08-06: 16:00:00 60 mg via INTRAVENOUS
  Filled 2018-08-06: qty 8

## 2018-08-06 MED ORDER — KETOTIFEN FUMARATE 0.025 % OP SOLN
1.0000 [drp] | Freq: Two times a day (BID) | OPHTHALMIC | Status: DC
  Administered 2018-08-07 – 2018-08-10 (×7): 1 [drp] via OPHTHALMIC
  Filled 2018-08-06 (×2): qty 5

## 2018-08-06 MED ORDER — INSULIN ASPART 100 UNIT/ML ~~LOC~~ SOLN: 0.0000 [IU] | Freq: Every day | SUBCUTANEOUS | Status: DC

## 2018-08-06 MED ORDER — ATORVASTATIN CALCIUM 20 MG PO TABS
40.0000 mg | ORAL_TABLET | Freq: Every day | ORAL | Status: DC
  Administered 2018-08-06 – 2018-08-09 (×4): 40 mg via ORAL
  Filled 2018-08-06 (×3): qty 2

## 2018-08-06 MED ORDER — HEPARIN BOLUS VIA INFUSION
4000.0000 [IU] | Freq: Once | INTRAVENOUS | Status: DC
  Filled 2018-08-06: qty 4000

## 2018-08-06 MED ORDER — ACETAMINOPHEN 325 MG PO TABS
650.0000 mg | ORAL_TABLET | Freq: Four times a day (QID) | ORAL | Status: DC | PRN
  Administered 2018-08-07: 650 mg via ORAL
  Filled 2018-08-06: qty 2

## 2018-08-06 NOTE — ED Triage Notes (Signed)
Pt arrived via ems for report of chest pain and SHOB since noon today - pt as discharged from hospital 2 weeks ago for CHF - pt also reports rectal bleeding ("dark" in color) for the past month and is on Xarelto - pt is edematous in all extremities

## 2018-08-06 NOTE — H&P (Addendum)
[facility] [place] NAME: [name] [name]    MR#:  [id number]  DATE OF BIRTH:  1960/06/14  DATE OF ADMISSION:  08/06/2018  PRIMARY CARE PHYSICIAN: [name], DO   REQUESTING/REFERRING PHYSICIAN: Dr Conni Slipper  CHIEF COMPLAINT:   Chief Complaint  Patient presents with  . Chest Pain  . Shortness of Breath    HISTORY OF PRESENT ILLNESS:  [name] [name]  is a 58 y.o. male presents to the hospital with shortness of breath and chest pain.  Patient states that he is not been feeling well.  Having some fever and some cold sweats.  He has been having nausea.  He states that the chest pain is in the center of his chest and radiates all around in his chest.  He has been feeling short of breath.  Some coughing.  Very fatigued.  In the ER, he had a low-grade temperature and chest x-ray concerning for congestive heart failure.  Hospitalist services contacted for further evaluation.   PAST MEDICAL HISTORY:   Past Medical History:  Diagnosis Date  . Asthma   . Brain damage   . COPD (chronic obstructive pulmonary disease) ([place])   . Depression   . HFrEF (heart failure with reduced ejection fraction) ([place])    a. 03/2018 Echo: EF 25-30%, diff HK. Mod dil LA.  [name] Persistent atrial fibrillation    a. 03/2018 s/p DCCV; b. CHA2DS2VASc = 1-->Xarelto; c. 05/2018 recurrent afib-->Amio initiated;   . Sleep apnea     PAST SURGICAL HISTORY:   Past Surgical History:  Procedure Laterality Date  . CARDIOVERSION N/A 03/24/2018   Procedure: CARDIOVERSION;  Surgeon: Minna Merritts, MD;  Location: [facility] ORS;  Service: Cardiovascular;  Laterality: N/A;  . hearnia repair     X 3- total of two surgeries  . LEG SURGERY      SOCIAL HISTORY:   Social History   Tobacco Use  . Smoking status: Never Smoker  . Smokeless tobacco: Never Used  Substance Use Topics  . Alcohol use: Never    Frequency: Never    FAMILY HISTORY:   Family History   Problem Relation Age of Onset  . Heart failure Mother   . Lung cancer Mother   . Lung cancer Father   . Heart attack Maternal Grandmother   . Heart attack Maternal Grandfather     DRUG ALLERGIES:  No Known Allergies  REVIEW OF SYSTEMS:  CONSTITUTIONAL: Positive for fever and cold chills.  Positive for fatigue and weakness.  EYES: Positive for blurred vision EARS, NOSE, AND THROAT: No tinnitus or ear pain. No sore throat RESPIRATORY: Positive for cough, and shortness of breath.no wheezing or hemoptysis.  CARDIOVASCULAR: Positive for chest pain.  Positive for edema.  GASTROINTESTINAL: Positive for nausea.  No vomiting, diarrhea or abdominal pain. No blood in bowel movements GENITOURINARY: No dysuria, hematuria.  ENDOCRINE: No polyuria, nocturia,  HEMATOLOGY: No anemia, easy bruising or bleeding SKIN: No rash or lesion. MUSCULOSKELETAL: No joint pain or arthritis.   NEUROLOGIC: No tingling, numbness, weakness.  PSYCHIATRY: Positive for depression with fianc dying suddenly in mid April 4.  No suicidal ideation  MEDICATIONS AT HOME:   Prior to Admission medications   Medication Sig Start Date End Date Taking? Authorizing Provider  allopurinol (ZYLOPRIM) 100 MG tablet Take 1 tablet (100 mg total) by mouth daily. 06/27/18  Yes Johnson, Megan P, DO  atorvastatin (LIPITOR) 40 MG tablet Take 1 tablet (40 mg total) by  mouth daily. 06/06/18  Yes Johnson, Megan P, DO  budesonide-formoterol (SYMBICORT) 160-4.5 MCG/ACT inhaler Inhale 2 puffs into the lungs 2 (two) times daily. 07/04/18  Yes Johnson, Megan P, DO  ferrous sulfate 325 (65 FE) MG tablet Take 1 tablet (325 mg total) by mouth 2 (two) times daily with a meal. 07/05/18  Yes Johnson, Megan P, DO  gabapentin (NEURONTIN) 300 MG capsule Take 1 capsule (300 mg total) by mouth 3 (three) times daily. 07/04/18  Yes Johnson, Megan P, DO  levothyroxine (SYNTHROID) 25 MCG tablet Take 1 tablet (25 mcg total) by mouth daily before breakfast. 07/05/18   Yes Johnson, Megan P, DO  lisinopril (ZESTRIL) 5 MG tablet Take 0.5 tablets (2.5 mg total) by mouth daily. 06/06/18  Yes Johnson, Megan P, DO  meloxicam (MOBIC) 15 MG tablet Take 1 tablet (15 mg total) by mouth daily. 07/04/18  Yes Johnson, Megan P, DO  metoprolol succinate (TOPROL-XL) 25 MG 24 hr tablet Take 1 tablet (25 mg total) by mouth 2 (two) times daily. 07/23/18  Yes Katha Hamming, MD  potassium chloride SA (K-DUR) 20 MEQ tablet Take 1 tablet (20 mEq total) by mouth 2 (two) times daily. 06/06/18  Yes Johnson, Megan P, DO  rivaroxaban (XARELTO) 20 MG TABS tablet Take 1 tablet (20 mg total) by mouth daily with supper. 06/29/18  Yes End, Cristal Deer, MD  spironolactone (ALDACTONE) 25 MG tablet Take 1 tablet (25 mg total) by mouth daily. 07/04/18  Yes Johnson, Megan P, DO  Thiamine HCl (B-1) 250 MG TABS Take 1 tablet by mouth daily.   Yes [provider]  torsemide (DEMADEX) 20 MG tablet Take 2 tablets (40 mg total) by mouth daily. 07/23/18  Yes Katha Hamming, MD  traZODone (DESYREL) 150 MG tablet Take 1 tablet (150 mg total) by mouth at bedtime as needed for sleep. 06/06/18  Yes Johnson, Megan P, DO  albuterol (VENTOLIN HFA) 108 (90 Base) MCG/ACT inhaler Inhale 2 puffs into the lungs every 6 (six) hours as needed for wheezing or shortness of breath. 07/04/18   Johnson, Megan P, DO  fluticasone (FLONASE) 50 MCG/ACT nasal spray Place 2 sprays into both nostrils 2 (two) times daily. 06/01/18   Salary, Evelena Asa, MD  ketotifen (ZADITOR) 0.025 % ophthalmic solution 1 drop 2 (two) times daily.    [provider]  loratadine (CLARITIN) 10 MG tablet Take 10 mg by mouth daily.    [provider]  nitroGLYCERIN (NITROSTAT) 0.4 MG SL tablet Place 1 tablet (0.4 mg total) under the tongue every 5 (five) minutes as needed for chest pain. 01/18/18   Auburn Bilberry, MD      VITAL SIGNS:  Blood pressure (!) 144/77, pulse 79, temperature 99.5 F (37.5 C), temperature source Oral,  resp. rate (!) 22, height 5\' 8"  (1.727 m), weight (!) 181.4 kg, SpO2 96 %.  PHYSICAL EXAMINATION:  GENERAL:  58 y.o.-year-old patient lying in the bed with slight acute respiratory distress.  EYES: Pupils equal, round, reactive to light and accommodation. No scleral icterus. Extraocular muscles intact.  HEENT: Head atraumatic, normocephalic. Oropharynx and nasopharynx clear.  NECK:  Supple, no jugular venous distention. No thyroid enlargement, no tenderness.  LUNGS: Decreased breath sounds bilaterally, no wheezing.positive rales at the bases. No use of accessory muscles of respiration.  CARDIOVASCULAR: S1, S2 irregularly irregular.  No murmurs, rubs, or gallops.  ABDOMEN: Soft, nontender, nondistended. Bowel sounds present. No organomegaly or mass.  EXTREMITIES: 3+ pedal edema.no cyanosis, or clubbing.  NEUROLOGIC: Cranial nerves II  through XII are intact. Muscle strength 5/5 in all extremities. Sensation intact. Gait not checked.  PSYCHIATRIC: The patient is alert and oriented x 3.  SKIN: Please see pictures   Left leg above Right leg below     LABORATORY PANEL:   CBC Recent Labs  Lab 08/06/18 1320  WBC 8.2  HGB 11.2*  HCT 36.8*  PLT 251   ------------------------------------------------------------------------------------------------------------------  Chemistries  Recent Labs  Lab 08/06/18 1320  NA 138  K 4.2  CL 103  CO2 22  GLUCOSE 129*  BUN 16  CREATININE 0.95  CALCIUM 8.3*  AST 19  ALT 11  ALKPHOS 74  BILITOT 0.3   ------------------------------------------------------------------------------------------------------------------  Cardiac Enzymes Recent Labs  Lab 08/06/18 1645  TROPONINI 0.04*   ------------------------------------------------------------------------------------------------------------------  RADIOLOGY:  Dg Chest Portable 1 View  Result Date: 08/06/2018 CLINICAL DATA:  Chest pain and shortness of breath today. EXAM: PORTABLE CHEST  1 VIEW COMPARISON:  07/18/2018 FINDINGS: Lordotic technique is demonstrated. Lungs are adequately inflated and demonstrate mild stable cardiomegaly with minimal vascular congestion. No lobar consolidation, effusion or pneumothorax. Remainder the exam is unchanged. IMPRESSION: Stable cardiomegaly with suggestion mild vascular congestion. Electronically Signed   By: Elberta Fortisaniel  Boyle M.D.   On: 08/06/2018 15:12    EKG:   Atrial fibrillation 103 bpm nonspecific ST-T wave changes  IMPRESSION AND PLAN:   1.  Acute on chronic systolic congestive heart failure.  IV Lasix 40 mg IV twice daily ordered.  Continue low-dose lisinopril, Toprol-XL and Spironolactone.  Cardiology consultation in a.m.  Last echocardiogram showed an EF of 25 to 30%. 2.  Chest pain with borderline troponin.  Will start on heparin drip just in case they want to do a cardiac catheterization.  Serial troponins. 3.  Bilateral lower extremity wounds.  Left leg worse than the right leg with drainage.  Patient also has low-grade fever.  Will put on antibiotics with vancomycin and cefepime and get a wound culture.  Wound consult. 4.  Morbid obesity with a BMI of 60.82. 5.  Depression with fianc dying suddenly and unexpectedly on April 4.  No thoughts of suicidal ideation.  I asked him if he wanted to speak with a psychiatrist and he declined. 6.  Atrial fibrillation.  Put back on beta-blocker.  Anticoagulation for now with heparin drip.  Likely go back on Xarelto as outpatient. 7.  COVID-19 testing negative. 8.  Impaired fasting glucose.  Put on sliding scale.  Last hemoglobin A1c 5.8.  All the records are reviewed and case discussed with ED provider. Management plans discussed with the patient, and he is in agreement.  CODE STATUS: Full code  TOTAL TIME TAKING CARE OF THIS PATIENT: 50 minutes.    Alford Highlandichard Loney Domingo M.D on 08/06/2018 at 6:40 PM  Between 7am to 6pm - Pager - [phone]  After 6pm call admission pager [phone]  [facility]  [phone]  CC: Primary care physician; Dorcas CarrowJohnson, Megan P, DO

## 2018-08-06 NOTE — Consult Note (Signed)
Pharmacy Antibiotic Note  [name] [name] is a 58 y.o. male admitted on 08/06/2018 with wound infection.  Pharmacy has been consulted for Vancomycin/Cefepime dosing.  Plan: Will Start Cefepime 2g q 8 hours  Will give Vancomycin loading dose of 2500mg , followed by  Vancomycin 1750 mg IV Q 12 hrs. Goal AUC 400-550. Expected AUC: 526 SCr used: 0.95  Height: 5\' 8"  (172.7 cm) Weight: (!) 400 lb (181.4 kg) IBW/kg (Calculated) : 68.4  Temp (24hrs), Avg:99.5 F (37.5 C), Min:99.5 F (37.5 C), Max:99.5 F (37.5 C)  Recent Labs  Lab 08/06/18 1320  WBC 8.2  CREATININE 0.95    Estimated Creatinine Clearance: 137.8 mL/min (by C-G formula based on SCr of 0.95 mg/dL).    No Known Allergies  Antimicrobials this admission: Vancomycin 6/21 >> Cefepime 6/21 >>  Dose adjustments this admission: None  Microbiology results: Wound cx 6/21 >> pending COVID NEG  Thank you for allowing pharmacy to be a part of this patient's care.  Lu Duffel, PharmD, BCPS Clinical Pharmacist 08/06/2018 7:04 PM

## 2018-08-06 NOTE — Progress Notes (Signed)
Patient ID: [name] [name], male   DOB: Apr 07, 1960, 58 y.o.   MRN: [id number]  ACP note  Patient present for conversation.  Patient coming in with not feeling well fever nausea vomiting chest pain and shortness of breath.  In the ER he was found to have congestive heart failure on the chest x-ray.  Patient also found to have a left lower extremity draining ulcer.  Patient is a full code.  Overall prognosis is poor with morbid obesity and acute on chronic systolic congestive heart failure.  Patient also has atrial fibrillation.  Patient will get IV antibiotics for infection of the left lower extremity draining ulcer.  Diuresis with IV Lasix for congestive heart failure.  Continue to monitor daily.  Cardiology consultation.  Time spent on ACP discussion 17 minutes Dr Loletha Grayer

## 2018-08-06 NOTE — Consult Note (Signed)
ANTICOAGULATION CONSULT NOTE - Initial Consult  Pharmacy Consult for Heparin Indication: chest pain/ACS  No Known Allergies  Patient Measurements: Height: 5\' 8"  (172.7 cm) Weight: (!) 400 lb (181.4 kg) IBW/kg (Calculated) : 68.4 Heparin Dosing Weight: 114kg  Vital Signs: Temp: 99.5 F (37.5 C) (06/21 1314) Temp Source: Oral (06/21 1314) BP: 144/77 (06/21 1530) Pulse Rate: 79 (06/21 1523)  Labs: Recent Labs    08/06/18 1320 08/06/18 1645  HGB 11.2*  --   HCT 36.8*  --   PLT 251  --   CREATININE 0.95  --   TROPONINI <0.03 0.04*    Estimated Creatinine Clearance: 137.8 mL/min (by C-G formula based on SCr of 0.95 mg/dL).   Medical History: Past Medical History:  Diagnosis Date  . Asthma   . Brain damage   . COPD (chronic obstructive pulmonary disease) ([place])   . HFrEF (heart failure with reduced ejection fraction) ([place])    a. 03/2018 Echo: EF 25-30%, diff HK. Mod dil LA.  [name] Persistent atrial fibrillation    a. 03/2018 s/p DCCV; b. CHA2DS2VASc = 1-->Xarelto; c. 05/2018 recurrent afib-->Amio initiated;   . Sleep apnea     Medications:  Patient on Xarelto prior to admission - last dose 6/20 @ 1800  Assessment: Pharmacy has been consulted to initiate and monitor heparin drip in this 58 yo morbidly obese male with chest pain and nausea.   Goal of Therapy:  Will follow APTT until Heparin Level (HL) and APTT correlate. aPTT 66-102 seconds Monitor platelets by anticoagulation protocol: Yes   Plan:  Will obtain baseline HL, APTT, INR, and CBC.  Will start heparin without bolus because of proximity to last Xarelto dose - will start Heparin @ 1600 units/hour.  Will recheck HL/APTT in 6 hours per protocol.  Lu Duffel, PharmD, BCPS Clinical Pharmacist 08/06/2018 6:54 PM

## 2018-08-06 NOTE — ED Notes (Signed)
ED TO INPATIENT HANDOFF REPORT  ED Nurse Name and Phone #:  [phone]  S Name/Age/Gender [name] MajesticKenneth [name] 58 y.o. male Room/Bed: ED18A/ED18A  Code Status   Code Status: Full Code  Home/SNF/Other Home Patient oriented to: self Is this baseline? Yes   Triage Complete: Triage complete  Chief Complaint Chest Pain/SOB  Triage Note Pt arrived via ems for report of chest pain and SHOB since noon today - pt as discharged from hospital 2 weeks ago for CHF - pt also reports rectal bleeding ("dark" in color) for the past month and is on Xarelto - pt is edematous in all extremities   Allergies No Known Allergies  Level of Care/Admitting Diagnosis ED Disposition    ED Disposition Condition Comment   Admit  [facility] [[id number]]  Level of Care: Telemetry [5]  Covid Evaluation: Confirmed COVID Negative  Diagnosis: Chest pain [[id number]][[id number]]  Admitting Physician: Alford HighlandWIETING, RICHARD [[id number]]  Attending Physician: Alford HighlandWIETING, RICHARD [phone][[id number]]  Estimated length of stay: past midnight tomorrow  Certification:: I certify this patient will need inpatient services for at least 2 midnights  PT Class (Do Not Modify): Inpatient [101]  PT Acc Code (Do Not Modify): Private [1]       B Medical/Surgery History Past Medical History:  Diagnosis Date  . Asthma   . Brain damage   . COPD (chronic obstructive pulmonary disease) (HCC)   . Depression   . HFrEF (heart failure with reduced ejection fraction) (HCC)    a. 03/2018 Echo: EF 25-30%, diff HK. Mod dil LA.  [name]. Persistent atrial fibrillation    a. 03/2018 s/p DCCV; b. CHA2DS2VASc = 1-->Xarelto; c. 05/2018 recurrent afib-->Amio initiated;   . Sleep apnea    Past Surgical History:  Procedure Laterality Date  . CARDIOVERSION N/A 03/24/2018   Procedure: CARDIOVERSION;  Surgeon: Antonieta IbaGollan, Timothy J, MD;  Location: [facility] ORS;  Service: Cardiovascular;  Laterality: N/A;  . hearnia repair     X 3- total of two surgeries  . LEG  SURGERY       A IV Location/Drains/Wounds Patient Lines/Drains/Airways Status   Active Line/Drains/Airways    Name:   Placement date:   Placement time:   Site:   Days:   Peripheral IV 08/06/18 Left Other (Comment)   08/06/18    1329    Other (Comment)   less than 1   Peripheral IV 08/06/18 Right Antecubital   08/06/18    2116    Antecubital   less than 1   Wound / Incision (Open or Dehisced) 07/18/18 Non-pressure wound Leg Right;Lower;Anterior open wound, patient says has been there for 1 year.   07/18/18    1845    Leg   19   Wound / Incision (Open or Dehisced) 07/18/18 Non-pressure wound Leg Left;Posterior;Lower open wound, red tissue base   07/18/18    1846    Leg   19          Intake/Output Last 24 hours  Intake/Output Summary (Last 24 hours) at 08/06/2018 2357 Last data filed at 08/06/2018 2153 Gross per 24 hour  Intake -  Output 2800 ml  Net -2800 ml    Labs/Imaging Results for orders placed or performed during the hospital encounter of 08/06/18 (from the past 48 hour(s))  Comprehensive metabolic panel     Status: Abnormal   Collection Time: 08/06/18  1:20 PM  Result Value Ref Range   Sodium 138 135 - 145 mmol/L   Potassium 4.2 3.5 - 5.1  mmol/L   Chloride 103 98 - 111 mmol/L   CO2 22 22 - 32 mmol/L   Glucose, Bld 129 (H) 70 - 99 mg/dL   BUN 16 6 - 20 mg/dL   Creatinine, Ser 0.95 0.61 - 1.24 mg/dL   Calcium 8.3 (L) 8.9 - 10.3 mg/dL   Total Protein 7.1 6.5 - 8.1 g/dL   Albumin 3.5 3.5 - 5.0 g/dL   AST 19 15 - 41 U/L   ALT 11 0 - 44 U/L   Alkaline Phosphatase 74 38 - 126 U/L   Total Bilirubin 0.3 0.3 - 1.2 mg/dL   GFR calc non Af Amer >60 >60 mL/min   GFR calc Af Amer >60 >60 mL/min   Anion gap 13 5 - 15    Comment: Performed at [facility], [address] [place], [place] [address]  Troponin I - Once     Status: None   Collection Time: 08/06/18  1:20 PM  Result Value Ref Range   Troponin I <0.03 <0.03 ng/mL    Comment: Performed at [facility], [address] [place], [place] [address]  CBC with Differential     Status: Abnormal   Collection Time: 08/06/18  1:20 PM  Result Value Ref Range   WBC 8.2 4.0 - 10.5 K/uL   RBC 4.33 4.22 - 5.81 MIL/uL   Hemoglobin 11.2 (L) 13.0 - 17.0 g/dL   HCT 36.8 (L) 39.0 - 52.0 %   MCV 85.0 80.0 - 100.0 fL   MCH 25.9 (L) 26.0 - 34.0 pg   MCHC 30.4 30.0 - 36.0 g/dL   RDW 22.0 (H) 11.5 - 15.5 %   Platelets 251 150 - 400 K/uL   nRBC 0.0 0.0 - 0.2 %   Neutrophils Relative % 82 %   Neutro Abs 6.7 1.7 - 7.7 K/uL   Lymphocytes Relative 8 %   Lymphs Abs 0.7 0.7 - 4.0 K/uL   Monocytes Relative 8 %   Monocytes Absolute 0.7 0.1 - 1.0 K/uL   Eosinophils Relative 1 %   Eosinophils Absolute 0.1 0.0 - 0.5 K/uL   Basophils Relative 1 %   Basophils Absolute 0.1 0.0 - 0.1 K/uL   WBC Morphology MORPHOLOGY UNREMARKABLE    RBC Morphology MIXED RBC POPULATION    Smear Review Normal platelet morphology    Immature Granulocytes 0 %   Abs Immature Granulocytes 0.02 0.00 - 0.07 K/uL    Comment: Performed at [facility], [address] [place], [place] [address]  Brain natriuretic peptide     Status: Abnormal   Collection Time: 08/06/18  1:20 PM  Result Value Ref Range   B Natriuretic Peptide 173.0 (H) 0.0 - 100.0 pg/mL    Comment: Performed at [facility], [address] [place], [place] [address]  Troponin I - ONCE - STAT     Status: Abnormal   Collection Time: 08/06/18  4:45 PM  Result Value Ref Range   Troponin I 0.04 (HH) <0.03 ng/mL    Comment: CRITICAL RESULT CALLED TO, READ BACK BY AND VERIFIED WITH KIM MAIN AT 1722 08/06/2018.PMF Performed at [facility], [address] [place],  [address]   SARS Coronavirus 2 (CEPHEID- Performed in [facility] hospital lab), Hosp Order     Status: None   Collection Time: 08/06/18  4:45 PM   Specimen: Nasopharyngeal Swab  Result Value Ref Range   SARS Coronavirus 2 NEGATIVE NEGATIVE    Comment: (NOTE) If result  is  NEGATIVE SARS-CoV-2 target nucleic acids are NOT DETECTED. The SARS-CoV-2 RNA is generally detectable in upper and lower  respiratory specimens during the acute phase of infection. The lowest  concentration of SARS-CoV-2 viral copies this assay can detect is 250  copies / mL. A negative result does not preclude SARS-CoV-2 infection  and should not be used as the sole basis for treatment or other  patient management decisions.  A negative result may occur with  improper specimen collection / handling, submission of specimen other  than nasopharyngeal swab, presence of viral mutation(s) within the  areas targeted by this assay, and inadequate number of viral copies  (<250 copies / mL). A negative result must be combined with clinical  observations, patient history, and epidemiological information. If result is POSITIVE SARS-CoV-2 target nucleic acids are DETECTED. The SARS-CoV-2 RNA is generally detectable in upper and lower  respiratory specimens dur ing the acute phase of infection.  Positive  results are indicative of active infection with SARS-CoV-2.  Clinical  correlation with patient history and other diagnostic information is  necessary to determine patient infection status.  Positive results do  not rule out bacterial infection or co-infection with other viruses. If result is PRESUMPTIVE POSTIVE SARS-CoV-2 nucleic acids MAY BE PRESENT.   A presumptive positive result was obtained on the submitted specimen  and confirmed on repeat testing.  While 2019 novel coronavirus  (SARS-CoV-2) nucleic acids may be present in the submitted sample  additional confirmatory testing may be necessary for epidemiological  and / or clinical management purposes  to differentiate between  SARS-CoV-2 and other Sarbecovirus currently known to infect humans.  If clinically indicated additional testing with an alternate test  methodology [phone]([id number]) is advised. The SARS-CoV-2 RNA is generally  detectable  in upper and lower respiratory sp ecimens during the acute  phase of infection. The expected result is Negative. Fact Sheet for Patients:  BoilerBrush.com.cyhttps://www.fda.gov/media/136312/download Fact Sheet for Healthcare Providers: https://pope.com/https://www.fda.gov/media/136313/download This test is not yet approved or cleared by the [place] FDA and has been authorized for detection and/or diagnosis of SARS-CoV-2 by FDA under an Emergency Use Authorization (EUA).  This EUA will remain in effect (meaning this test can be used) for the duration of the COVID-19 declaration under Section 564(b)(1) of the Act, 21 U.S.C. section 360bbb-3(b)(1), unless the authorization is terminated or revoked sooner. Performed at [facility], [address] [place], [place] [address]   APTT     Status: None   Collection Time: 08/06/18  7:30 PM  Result Value Ref Range   aPTT 25 24 - 36 seconds    Comment: Performed at [facility], [address] [place], [place] [address]  Heparin level (unfractionated)     Status: Abnormal   Collection Time: 08/06/18  7:30 PM  Result Value Ref Range   Heparin Unfractionated 0.11 (L) 0.30 - 0.70 IU/mL    Comment: (NOTE) If heparin results are below expected values, and patient dosage has  been confirmed, suggest follow up testing of antithrombin III levels. Performed at [facility], [address] [place], [place] [address]   Protime-INR     Status: None   Collection Time: 08/06/18  7:30 PM  Result Value Ref Range   Prothrombin Time 13.9 11.4 - 15.2 seconds   INR 1.1 0.8 - 1.2    Comment: (NOTE) INR goal varies based on device and disease states. Performed at [facility], [address] [place], [place] [address]  Glucose, capillary     Status: Abnormal   Collection Time: 08/06/18 10:24 PM  Result Value Ref Range   Glucose-Capillary 103 (H) 70 - 99 mg/dL   Dg Chest Portable 1 View  Result Date: 08/06/2018 CLINICAL DATA:  Chest  pain and shortness of breath today. EXAM: PORTABLE CHEST 1 VIEW COMPARISON:  07/18/2018 FINDINGS: Lordotic technique is demonstrated. Lungs are adequately inflated and demonstrate mild stable cardiomegaly with minimal vascular congestion. No lobar consolidation, effusion or pneumothorax. Remainder the exam is unchanged. IMPRESSION: Stable cardiomegaly with suggestion mild vascular congestion. Electronically Signed   By: Elberta Fortisaniel  Boyle M.D.   On: 08/06/2018 15:12    Pending Labs Unresulted Labs (From admission, onward)    Start     Ordered   08/07/18 0500  Basic metabolic panel  Tomorrow morning,   STAT     08/06/18 1815   08/07/18 0500  CBC  Tomorrow morning,   STAT     08/06/18 1815   08/07/18 0500  Lipid panel  Tomorrow morning,   STAT     08/06/18 1903   08/07/18 0500  TSH  Tomorrow morning,   STAT     08/06/18 1903   08/07/18 0100  Heparin level (unfractionated)  Once-Timed,   STAT     08/06/18 1818   08/07/18 0100  APTT  Once-Timed,   STAT     08/06/18 1821   08/07/18 0100  Troponin I - Once  Once,   STAT     08/06/18 1903   08/06/18 1813  Aerobic/Anaerobic Culture (surgical/deep wound)  Once,   STAT    Question:  Patient immune status  Answer:  Normal   08/06/18 1812          Vitals/Pain Today's Vitals   08/06/18 1934 08/06/18 2040 08/06/18 2259 08/06/18 2326  BP: (!) 142/85  (!) 137/56   Pulse: 80     Resp: 19     Temp:      TempSrc:      SpO2: 98%     Weight:      Height:      PainSc:  7   6     Isolation Precautions Droplet precaution  Medications Medications  acetaminophen (TYLENOL) tablet 650 mg (has no administration in time range)    Or  acetaminophen (TYLENOL) suppository 650 mg (has no administration in time range)  oxyCODONE (Oxy IR/ROXICODONE) immediate release tablet 5 mg (5 mg Oral Given 08/06/18 2342)  ondansetron (ZOFRAN) tablet 4 mg (has no administration in time range)    Or  ondansetron (ZOFRAN) injection 4 mg (has no administration in time  range)  morphine 2 MG/ML injection 2 mg (has no administration in time range)  allopurinol (ZYLOPRIM) tablet 100 mg (has no administration in time range)  atorvastatin (LIPITOR) tablet 40 mg (40 mg Oral Given 08/06/18 2124)  lisinopril (ZESTRIL) tablet 2.5 mg (has no administration in time range)  metoprolol succinate (TOPROL-XL) 24 hr tablet 25 mg (25 mg Oral Given 08/06/18 2259)  nitroGLYCERIN (NITROSTAT) SL tablet 0.4 mg (has no administration in time range)  spironolactone (ALDACTONE) tablet 25 mg (has no administration in time range)  traZODone (DESYREL) tablet 150 mg (150 mg Oral Given 08/06/18 2257)  levothyroxine (SYNTHROID) tablet 25 mcg (has no administration in time range)  ferrous sulfate tablet 325 mg (has no administration in time range)  gabapentin (NEURONTIN) capsule 300 mg (300 mg Oral Given 08/06/18 2258)  potassium chloride SA (K-DUR) CR tablet 20 mEq (20 mEq  Oral Given 08/06/18 2257)  thiamine (VITAMIN B-1) tablet 100 mg (100 mg Oral Given 08/06/18 2124)  albuterol (PROVENTIL) (2.5 MG/3ML) 0.083% nebulizer solution 3 mL (has no administration in time range)  mometasone-formoterol (DULERA) 200-5 MCG/ACT inhaler 2 puff (has no administration in time range)  fluticasone (FLONASE) 50 MCG/ACT nasal spray 2 spray (has no administration in time range)  loratadine (CLARITIN) tablet 10 mg (10 mg Oral Given 08/06/18 2124)  ketotifen (ZADITOR) 0.025 % ophthalmic solution 1 drop (has no administration in time range)  furosemide (LASIX) injection 40 mg (40 mg Intravenous Given 08/06/18 2126)  heparin ADULT infusion 100 units/mL (25000 units/245mL sodium chloride 0.45%) (1,600 Units/hr Intravenous New Bag/Given 08/06/18 2306)  ceFEPIme (MAXIPIME) 2 g in sodium chloride 0.9 % 100 mL IVPB (0 g Intravenous Stopped 08/06/18 2037)  insulin aspart (novoLOG) injection 0-9 Units (has no administration in time range)  insulin aspart (novoLOG) injection 0-5 Units (0 Units Subcutaneous Not Given 08/06/18  2231)  vancomycin (VANCOCIN) 1,750 mg in sodium chloride 0.9 % 500 mL IVPB (has no administration in time range)  morphine 2 MG/ML injection 2 mg (2 mg Intravenous Given 08/06/18 1457)  ondansetron (ZOFRAN) injection 4 mg (4 mg Intravenous Given 08/06/18 1518)  furosemide (LASIX) injection 60 mg (60 mg Intravenous Given 08/06/18 1550)  vancomycin (VANCOCIN) 2,500 mg in sodium chloride 0.9 % 500 mL IVPB (0 mg Intravenous Stopped 08/06/18 2306)    Mobility walks High fall risk   Focused Assessments Cardiac Assessment Handoff:  Cardiac Rhythm: Atrial fibrillation Lab Results  Component Value Date   TROPONINI 0.04 (HH) 08/06/2018   No results found for: DDIMER Does the Patient currently have chest pain? No     R Recommendations: See Admitting Provider Note  Report given to:   Additional Notes:

## 2018-08-06 NOTE — ED Provider Notes (Signed)
[facility] Emergency Department Provider Note   ____________________________________________   First MD Initiated Contact with Patient 08/06/18 1309     (approximate)  I have reviewed the triage vital signs and the nursing notes.   HISTORY  Chief Complaint Chest Pain and Shortness of Breath    HPI [name] [name] is a 58 y.o. male who reports onset of chest tightness and shortness of breath today at about noon.  It continues now.  Seems to be worse if he lays backwards or moves around.  Moving around makes him visibly short of breath.  Includes just standing and pivoting to get from the EMS stretcher to the ER stretcher.  Patient is on Xarelto.  Patient reports rectal bleeding for a month.  Please see past medical history below         Past Medical History:  Diagnosis Date  . Asthma   . Brain damage   . COPD (chronic obstructive pulmonary disease) (HCC)   . HFrEF (heart failure with reduced ejection fraction) (HCC)    a. 03/2018 Echo: EF 25-30%, diff HK. Mod dil LA.  [name]. Persistent atrial fibrillation    a. 03/2018 s/p DCCV; b. CHA2DS2VASc = 1-->Xarelto; c. 05/2018 recurrent afib-->Amio initiated;   . Sleep apnea     Patient Active Problem List   Diagnosis Date Noted  . Acute on chronic systolic CHF (congestive heart failure) (HCC) 07/18/2018  . Chronic atrial fibrillation   . Personal history of DVT (deep vein thrombosis) 07/13/2018  . History of pulmonary embolism (on Xarelto) 07/13/2018  . Neck pain 07/13/2018  . Thoracic spine pain 07/13/2018  . Chronic bilateral low back pain with bilateral sciatica 07/13/2018  . Chronic pain of both knees 07/13/2018  . TBI (traumatic brain injury) (HCC) 07/04/2018  . Anemia 06/27/2018  . Abnormal thyroid blood test 06/27/2018  . Diet-controlled diabetes mellitus (HCC) 06/06/2018  . HTN (hypertension) 06/06/2018  . Gout 06/06/2018  . Morbid obesity (HCC) 06/06/2018  . Chronic pain 06/06/2018  . Ulcers  of both lower extremities, limited to breakdown of skin (HCC) 06/06/2018  . Persistent atrial fibrillation   . Chronic systolic heart failure (HCC) 02/02/2018  . COPD (chronic obstructive pulmonary disease) (HCC) 02/02/2018  . Obstructive sleep apnea 02/02/2018  . Acute respiratory failure with hypoxia (HCC) 01/27/2018    Past Surgical History:  Procedure Laterality Date  . CARDIOVERSION N/A 03/24/2018   Procedure: CARDIOVERSION;  Surgeon: Antonieta IbaGollan, Timothy J, MD;  Location: [facility] ORS;  Service: Cardiovascular;  Laterality: N/A;  . hearnia repair     X 3- total of two surgeries  . LEG SURGERY      Prior to Admission medications   Medication Sig Start Date End Date Taking? Authorizing Provider  albuterol (VENTOLIN HFA) 108 (90 Base) MCG/ACT inhaler Inhale 2 puffs into the lungs every 6 (six) hours as needed for wheezing or shortness of breath. 07/04/18   Johnson, Megan P, DO  allopurinol (ZYLOPRIM) 100 MG tablet Take 1 tablet (100 mg total) by mouth daily. 06/27/18   Johnson, Megan P, DO  amiodarone (PACERONE) 200 MG tablet Take 0.5 tablets (100 mg total) by mouth 2 (two) times daily. Patient not taking: Reported on 07/28/2018 07/23/18   Katha HammingKonidena, Snehalatha, MD  atorvastatin (LIPITOR) 40 MG tablet Take 1 tablet (40 mg total) by mouth daily. 06/06/18   Johnson, Megan P, DO  budesonide-formoterol (SYMBICORT) 160-4.5 MCG/ACT inhaler Inhale 2 puffs into the lungs 2 (two) times daily. 07/04/18   Dorcas CarrowJohnson, Megan P,  DO  ferrous sulfate 325 (65 FE) MG tablet Take 1 tablet (325 mg total) by mouth 2 (two) times daily with a meal. 07/05/18   Johnson, Megan P, DO  fluticasone (FLONASE) 50 MCG/ACT nasal spray Place 2 sprays into both nostrils 2 (two) times daily. 06/01/18   Salary, Avel Peace, MD  gabapentin (NEURONTIN) 300 MG capsule Take 1 capsule (300 mg total) by mouth 3 (three) times daily. 07/04/18   Johnson, Megan P, DO  ketotifen (ZADITOR) 0.025 % ophthalmic solution 1 drop 2 (two) times daily.    [provider]  levothyroxine (SYNTHROID) 25 MCG tablet Take 1 tablet (25 mcg total) by mouth daily before breakfast. 07/05/18   Wynetta Emery, Megan P, DO  lisinopril (ZESTRIL) 5 MG tablet Take 0.5 tablets (2.5 mg total) by mouth daily. 06/06/18   Johnson, Megan P, DO  loratadine (CLARITIN) 10 MG tablet Take 10 mg by mouth daily.    [provider]  meloxicam (MOBIC) 15 MG tablet Take 1 tablet (15 mg total) by mouth daily. 07/04/18   Johnson, Megan P, DO  metoprolol succinate (TOPROL-XL) 25 MG 24 hr tablet Take 1 tablet (25 mg total) by mouth 2 (two) times daily. 07/23/18   Epifanio Lesches, MD  nitroGLYCERIN (NITROSTAT) 0.4 MG SL tablet Place 1 tablet (0.4 mg total) under the tongue every 5 (five) minutes as needed for chest pain. 01/18/18   Dustin Flock, MD  oxyCODONE-acetaminophen (PERCOCET/ROXICET) 5-325 MG tablet Take 1-2 tablets by mouth every 8 (eight) hours as needed for moderate pain. 07/23/18   Epifanio Lesches, MD  potassium chloride SA (K-DUR) 20 MEQ tablet Take 1 tablet (20 mEq total) by mouth 2 (two) times daily. 06/06/18   Johnson, Megan P, DO  rivaroxaban (XARELTO) 20 MG TABS tablet Take 1 tablet (20 mg total) by mouth daily with supper. 06/29/18   End, Harrell Gave, MD  spironolactone (ALDACTONE) 25 MG tablet Take 1 tablet (25 mg total) by mouth daily. 07/04/18   Johnson, Megan P, DO  Thiamine HCl (B-1) 250 MG TABS Take 1 tablet by mouth daily.    [provider]  torsemide (DEMADEX) 20 MG tablet Take 2 tablets (40 mg total) by mouth daily. 07/23/18   Epifanio Lesches, MD  traZODone (DESYREL) 150 MG tablet Take 1 tablet (150 mg total) by mouth at bedtime as needed for sleep. 06/06/18   [name], DO    Allergies Patient has no known allergies.  Family History  Problem Relation Age of Onset  . Heart failure Mother   . Lung cancer Mother   . Lung cancer Father   . Heart attack Maternal Grandmother   . Heart attack Maternal Grandfather     Social History  Social History   Tobacco Use  . Smoking status: Never Smoker  . Smokeless tobacco: Never Used  Substance Use Topics  . Alcohol use: Never    Frequency: Never  . Drug use: Never    Review of Systems  Constitutional: No fever/chills Eyes: No visual changes. ENT: No sore throat. Cardiovascular:  chest pain. Respiratory: shortness of breath. Gastrointestinal: No abdominal pain.  No nausea, no vomiting.  No diarrhea.  No constipation. Genitourinary: Negative for dysuria. Musculoskeletal: Negative for back pain. Skin: Negative for rash. Neurological: Negative for headaches, focal weakness   ____________________________________________   PHYSICAL EXAM:  VITAL SIGNS: ED Triage Vitals [08/06/18 1314]  Enc Vitals Group     BP (!) 155/84     Pulse Rate 71  Resp (!) 26     Temp 99.5 F (37.5 C)     Temp Source Oral     SpO2 97 %     Weight      Height      Head Circumference      Peak Flow      Pain Score      Pain Loc      Pain Edu?      Excl. in GC?     Constitutional: Alert and oriented.  Chronically ill-appearing man who is short of breath with movement Eyes: Conjunctivae are normal.  Head: Atraumatic. Nose: No congestion/rhinnorhea. Mouth/Throat: Mucous membranes are moist.  Oropharynx non-erythematous. Neck: No stridor. Cardiovascular: Normal rate, regular rhythm. Grossly normal heart sounds.  Good peripheral circulation. Respiratory: Normal respiratory effort.  No retractions. Lungs CTAB exam limited by patient's thick chest wall. Gastrointestinal: Soft and nontender. No distention. No abdominal bruits. No CVA tenderness. Musculoskeletal: Patient with both lower extremities wrapped he has marked edema bilaterally Neurologic:  Normal speech and language. No gross focal neurologic deficits are appreciated.  Skin:  Skin is warm, dry and intact. No rash noted.   ____________________________________________   LABS (all labs ordered are listed, but only  abnormal results are displayed)  Labs Reviewed  COMPREHENSIVE METABOLIC PANEL - Abnormal; Notable for the following components:      Result Value   Glucose, Bld 129 (*)    Calcium 8.3 (*)    All other components within normal limits  CBC WITH DIFFERENTIAL/PLATELET - Abnormal; Notable for the following components:   Hemoglobin 11.2 (*)    HCT 36.8 (*)    MCH 25.9 (*)    RDW 22.0 (*)    All other components within normal limits  BRAIN NATRIURETIC PEPTIDE - Abnormal; Notable for the following components:   B Natriuretic Peptide 173.0 (*)    All other components within normal limits  SARS CORONAVIRUS 2 (HOSPITAL ORDER, PERFORMED IN [facility])  TROPONIN I  TROPONIN I   ____________________________________________  EKG EKG read interpreted by me shows A. fib at a rate of 103 left bundle branch block no acute changes similar to prior  ____________________________________________  RADIOLOGY  ED MD interpretation: X-ray shows cardiomegaly and some CHF  Official radiology report(s): Dg Chest Portable 1 View  Result Date: 08/06/2018 CLINICAL DATA:  Chest pain and shortness of breath today. EXAM: PORTABLE CHEST 1 VIEW COMPARISON:  07/18/2018 FINDINGS: Lordotic technique is demonstrated. Lungs are adequately inflated and demonstrate mild stable cardiomegaly with minimal vascular congestion. No lobar consolidation, effusion or pneumothorax. Remainder the exam is unchanged. IMPRESSION: Stable cardiomegaly with suggestion mild vascular congestion. Electronically Signed   By: Elberta Fortisaniel  Boyle M.D.   On: 08/06/2018 15:12    ____________________________________________   PROCEDURES  Procedure(s) performed (including Critical Care):  Procedures   ____________________________________________   INITIAL IMPRESSION / ASSESSMENT AND PLAN / ED COURSE  Patient signed out to Dr. Mayford KnifeWilliams.  Initial labs are okay.  BNP is only 173.  We will repeat the troponin.  Patient sats are  good on his oxygen.            Patient taken off oxygen his sats are still good but he is when he sits up on the side of the bed to urinate his respiratory rate goes up to 36 times a minute.  His O2 sat really does not drop significantly but his respiratory rate is very high he gets very uncomfortable looking complains of chest  pain.  I think the safest thing to do is admit this gentleman  [name] [name] was evaluated in Emergency Department on 08/06/2018 for the symptoms described in the history of present illness. He was evaluated in the context of the global COVID-19 pandemic, which necessitated consideration that the patient might be at risk for infection with the SARS-CoV-2 virus that causes COVID-19. Institutional protocols and algorithms that pertain to the evaluation of patients at risk for COVID-19 are in a state of rapid change based on information released by regulatory bodies including the CDC and federal and state organizations. These policies and algorithms were followed during the patient's care in the ED.     ____________________________________________   FINAL CLINICAL IMPRESSION(S) / ED DIAGNOSES  Final diagnoses:  Acute on chronic congestive heart failure, unspecified heart failure type (HCC)  COPD exacerbation (HCC)  Chest pain, unspecified type     ED Discharge Orders    None       Note:  This document was prepared using Dragon voice recognition software and may include unintentional dictation errors.    Arnaldo Natal, MD 08/06/18 [phone]

## 2018-08-06 NOTE — ED Notes (Signed)
Pt refusing to stay in bed and keep leads on. Pt used urinal once but spilled part of the contents. Pt refusing to use urinal and using toilet. One output logged at 500 ml.

## 2018-08-07 ENCOUNTER — Encounter: Payer: Self-pay | Admitting: Physician Assistant

## 2018-08-07 ENCOUNTER — Other Ambulatory Visit: Payer: Self-pay

## 2018-08-07 DIAGNOSIS — I5023 Acute on chronic systolic (congestive) heart failure: Secondary | ICD-10-CM

## 2018-08-07 DIAGNOSIS — I4819 Other persistent atrial fibrillation: Secondary | ICD-10-CM

## 2018-08-07 DIAGNOSIS — I248 Other forms of acute ischemic heart disease: Secondary | ICD-10-CM

## 2018-08-07 LAB — TSH: TSH: 6.121 u[IU]/mL — ABNORMAL HIGH (ref 0.350–4.500)

## 2018-08-07 LAB — BASIC METABOLIC PANEL
Anion gap: 6 (ref 5–15)
BUN: 15 mg/dL (ref 6–20)
CO2: 31 mmol/L (ref 22–32)
Calcium: 8.2 mg/dL — ABNORMAL LOW (ref 8.9–10.3)
Chloride: 99 mmol/L (ref 98–111)
Creatinine, Ser: 1.05 mg/dL (ref 0.61–1.24)
GFR calc Af Amer: >60 mL/min (ref >60)
GFR calc non Af Amer: >60 mL/min (ref >60)
Glucose, Bld: 110 mg/dL — ABNORMAL HIGH (ref 70–99)
Potassium: 3.8 mmol/L (ref 3.5–5.1)
Sodium: 136 mmol/L (ref 135–145)

## 2018-08-07 LAB — CBC
HCT: 34.8 % — ABNORMAL LOW (ref 39.0–52.0)
Hemoglobin: 10.7 g/dL — ABNORMAL LOW (ref 13.0–17.0)
MCH: 26 pg (ref 26.0–34.0)
MCHC: 30.7 g/dL (ref 30.0–36.0)
MCV: 84.7 fL (ref 80.0–100.0)
Platelets: 231 10*3/uL (ref 150–400)
RBC: 4.11 MIL/uL — ABNORMAL LOW (ref 4.22–5.81)
RDW: 22 % — ABNORMAL HIGH (ref 11.5–15.5)
WBC: 6 10*3/uL (ref 4.0–10.5)
nRBC: 0 % (ref 0.0–0.2)

## 2018-08-07 LAB — TROPONIN I: Troponin I: 0.04 ng/mL (ref <0.03)

## 2018-08-07 LAB — LIPID PANEL
Cholesterol: 134 mg/dL (ref 0–200)
HDL: 54 mg/dL (ref >40)
LDL Cholesterol: 49 mg/dL (ref 0–99)
Total CHOL/HDL Ratio: 2.5 RATIO
Triglycerides: 157 mg/dL — ABNORMAL HIGH (ref <150)
VLDL: 31 mg/dL (ref 0–40)

## 2018-08-07 LAB — GLUCOSE, CAPILLARY
Glucose-Capillary: 91 mg/dL (ref 70–99)
Glucose-Capillary: 110 mg/dL — ABNORMAL HIGH (ref 70–99)
Glucose-Capillary: 110 mg/dL — ABNORMAL HIGH (ref 70–99)
Glucose-Capillary: 100 mg/dL — ABNORMAL HIGH (ref 70–99)

## 2018-08-07 LAB — APTT: aPTT: 32 seconds (ref 24–36)

## 2018-08-07 LAB — HEPARIN LEVEL (UNFRACTIONATED)
Heparin Unfractionated: 0.15 IU/mL — ABNORMAL LOW (ref 0.30–0.70)
Heparin Unfractionated: 0.16 IU/mL — ABNORMAL LOW (ref 0.30–0.70)

## 2018-08-07 LAB — T4, FREE: Free T4: 0.89 ng/dL (ref 0.61–1.12)

## 2018-08-07 MED ORDER — SODIUM CHLORIDE 0.9% FLUSH
3.0000 mL | Freq: Two times a day (BID) | INTRAVENOUS | Status: DC
  Administered 2018-08-07 – 2018-08-10 (×5): 3 mL via INTRAVENOUS

## 2018-08-07 MED ORDER — SODIUM CHLORIDE 0.9 % IV SOLN
INTRAVENOUS | Status: DC | PRN
  Administered 2018-08-07 – 2018-08-08 (×2): 250 mL via INTRAVENOUS
  Administered 2018-08-09: 500 mL via INTRAVENOUS

## 2018-08-07 MED ORDER — AMIODARONE HCL 200 MG PO TABS
200.0000 mg | ORAL_TABLET | Freq: Every day | ORAL | Status: DC
  Administered 2018-08-07 – 2018-08-10 (×4): 200 mg via ORAL
  Filled 2018-08-07 (×4): qty 1

## 2018-08-07 MED ORDER — HEPARIN BOLUS VIA INFUSION
3400.0000 [IU] | Freq: Once | INTRAVENOUS | Status: DC
  Filled 2018-08-07: qty 3400

## 2018-08-07 MED ORDER — RIVAROXABAN 20 MG PO TABS
20.0000 mg | ORAL_TABLET | Freq: Every day | ORAL | Status: DC
  Administered 2018-08-07 – 2018-08-10 (×4): 20 mg via ORAL
  Filled 2018-08-07 (×4): qty 1

## 2018-08-07 NOTE — Consult Note (Signed)
ANTICOAGULATION CONSULT NOTE - Initial Consult  Pharmacy Consult for Heparin Indication: chest pain/ACS  No Known Allergies  Patient Measurements: Height: 5\' 8"  (172.7 cm) Weight: (!) 403 lb (182.8 kg) IBW/kg (Calculated) : 68.4 Heparin Dosing Weight: 114kg  Vital Signs: Temp: 98.2 F (36.8 C) (06/22 0719) Temp Source: Oral (06/22 0719) BP: 124/85 (06/22 0719) Pulse Rate: 84 (06/22 0719)  Labs: Recent Labs    08/06/18 1320 08/06/18 1645 08/06/18 1930 08/07/18 0021 08/07/18 0536  HGB 11.2*  --   --   --  10.7*  HCT 36.8*  --   --   --  34.8*  PLT 251  --   --   --  231  APTT  --   --  25 32  --   LABPROT  --   --  13.9  --   --   INR  --   --  1.1  --   --   HEPARINUNFRC  --   --  0.11* 0.16*  --   CREATININE 0.95  --   --   --  1.05  TROPONINI <0.03 0.04*  --  0.04*  --     Estimated Creatinine Clearance: 125.4 mL/min (by C-G formula based on SCr of 1.05 mg/dL).   Medical History: Past Medical History:  Diagnosis Date  . Asthma   . Brain damage   . COPD (chronic obstructive pulmonary disease) ([place])   . Depression   . HFrEF (heart failure with reduced ejection fraction) ([place])    a. 03/2018 Echo: EF 25-30%, diff HK. Mod dil LA.  [name] Persistent atrial fibrillation    a. 03/2018 s/p DCCV; b. CHA2DS2VASc = 1-->Xarelto; c. 05/2018 recurrent afib-->Amio initiated;   . Sleep apnea     Medications:  Patient on Xarelto prior to admission - last dose 6/20 @ 1800  Assessment: Pharmacy has been consulted to initiate and monitor heparin drip in this 58 yo morbidly obese male with chest pain and nausea. Baseline HL was 0.11 - will use heparin level to monitor heparin.   Started on a rate of 1600 units/hr 6/22 @0021  HL 0.16   Goal of Therapy:  Heparin level 0.3-0.7 units/ml Monitor platelets by anticoagulation protocol: Yes   Plan:  Heparin infusion started without bolus because of proximity to last Xarelto dose - will start Heparin @ 1600 units/hour. Level was obtain  ~ midnight and heparin was not adjusted. Will order a stat heparin level and reassess.    Oswald Hillock, PharmD, BCPS Clinical Pharmacist 08/07/2018 7:38 AM

## 2018-08-07 NOTE — H&P (View-Only) (Signed)
Cardiology Consultation:   Patient ID: [name] [name]; [id number]; 1960/07/28   Admit date: 08/06/2018 Date of Consult: 08/07/2018  Primary Care Provider: Dorcas CarrowJohnson, Megan P, DO Primary Cardiologist: End   Patient Profile:   [name] [name] is a 58 y.o. male with a hx of HFrEF secondary to presumed NICM, persistent Afib on amiodarone and Xarelto, COPD, morbid obesity, and sleep apnea on CPAP who is being seen today for the evaluation of acute on chronic HFrEF and persistent Afib at the request of Dr. Renae GlossWieting.  History of Present Illness:   [name] [name] previously resided in [place], [place] where he reported undergoing a LHC ~ 12 months prior without significant disease being noted per his report. He has subsequently relocated to the [place] area and was admitted in 03/2018 with acute on chronic HFrEF in the setting of Afib. Following diuresis, he underwent DCCV, though failed to follow up after discharge. Echo at that time showed an EF of 25-30%, mildly dilated LV, diffuse HK, moderately dilated LA. He was readmitted in 05/2018 with chest pain and SOB in the setting of recurrent Afib with concern for viral illness. He tested negative for COVID-19. He was rate controlled and started on amiodarone load. Repeat DCCV was deferred given adequate rate control and need for amiodarone loading. He was seen in telemedicine follow up on 06/08/2018 noting subjective fever and chronic arthralgias with chronic BC Powder use. He denied any exertional chest pain, though did notice exertional palpitations and dyspnea. His leg edema had improved and his weight was down to 380 pounds. He also reported occasional BRBPR and had previously noted this in the past leading to a colonoscopy that found hemorrhoids. It was noted he was taking torsemide and spironolactone on alternating days rather than daily. He was most recently admitted to the hospital in early 07/2018 with acute on chronic HFrEF and was diuresed with  IV Lasix with improvement in symptoms. It was recommended he continue to be loaded with amiodarone followed up outpatient DCCV later in 07/2018.   The patient was in his usual state of health until around 11 AM on 6/21 when he developed worsening SOB and chest tightness. He reported compliance with all medications and denied any dietary indiscretions. There was also a subjective low grade fever and nausea. He states he has not eaten well since his girlfriend died suddenly in 05/2018. His lower extremity swelling was noted to actually be somewhat improved when compared to prior.   Upon the [facility]'s arrival [name] they were found to have mildly elevated BP into the 150s systolic with heart rates ranging from the 80s to 1-teens bpm, T-max 99.8, oxygen saturation 100% on room air, weight 182.8 kg. EKG showed Afib with RVR as below, CXR showed stable cardiomegaly with possible mild vascular congestion. Labs showed troponin 0.04 x 2, COVID-19 negative, BNP 173, WBC 8.2, HGB 11.2, SCr 0.95-->1.05, K+ 4.2-->3.8, AST/ALT normal, albumin 3.5, TSH 6.121, LDL 49. He was placed on heparin gtt in place of PTA Xarleto upon admission as well as IV Lasix 40 mg bid with a documented UOP of 2.9 L for the admission. He continues to note SOB as well as LUQ abdominal pain.   Past Medical History:  Diagnosis Date   Asthma    Brain damage    COPD (chronic obstructive pulmonary disease) (HCC)    Depression    HFrEF (heart failure with reduced ejection fraction) (HCC)    a. 03/2018 Echo: EF 25-30%, diff HK. Mod  dil LA.   Persistent atrial fibrillation    a. 03/2018 s/p DCCV; b. CHA2DS2VASc = 1-->Xarelto; c. 05/2018 recurrent afib-->Amio initiated;    Sleep apnea     Past Surgical History:  Procedure Laterality Date   CARDIOVERSION N/A 03/24/2018   Procedure: CARDIOVERSION;  Surgeon: Antonieta IbaGollan, Timothy J, MD;  Location: [facility] ORS;  Service: Cardiovascular;  Laterality: N/A;   hearnia repair     X 3- total of two  surgeries   LEG SURGERY       Home Meds: Prior to Admission medications   Medication Sig Start Date End Date Taking? Authorizing Provider  allopurinol (ZYLOPRIM) 100 MG tablet Take 1 tablet (100 mg total) by mouth daily. 06/27/18  Yes Johnson, Megan P, DO  atorvastatin (LIPITOR) 40 MG tablet Take 1 tablet (40 mg total) by mouth daily. 06/06/18  Yes Johnson, Megan P, DO  budesonide-formoterol (SYMBICORT) 160-4.5 MCG/ACT inhaler Inhale 2 puffs into the lungs 2 (two) times daily. 07/04/18  Yes Johnson, Megan P, DO  ferrous sulfate 325 (65 FE) MG tablet Take 1 tablet (325 mg total) by mouth 2 (two) times daily with a meal. 07/05/18  Yes Johnson, Megan P, DO  gabapentin (NEURONTIN) 300 MG capsule Take 1 capsule (300 mg total) by mouth 3 (three) times daily. 07/04/18  Yes Johnson, Megan P, DO  levothyroxine (SYNTHROID) 25 MCG tablet Take 1 tablet (25 mcg total) by mouth daily before breakfast. 07/05/18  Yes Johnson, Megan P, DO  lisinopril (ZESTRIL) 5 MG tablet Take 0.5 tablets (2.5 mg total) by mouth daily. 06/06/18  Yes Johnson, Megan P, DO  meloxicam (MOBIC) 15 MG tablet Take 1 tablet (15 mg total) by mouth daily. 07/04/18  Yes Johnson, Megan P, DO  metoprolol succinate (TOPROL-XL) 25 MG 24 hr tablet Take 1 tablet (25 mg total) by mouth 2 (two) times daily. 07/23/18  Yes Katha HammingKonidena, Snehalatha, MD  potassium chloride SA (K-DUR) 20 MEQ tablet Take 1 tablet (20 mEq total) by mouth 2 (two) times daily. 06/06/18  Yes Johnson, Megan P, DO  rivaroxaban (XARELTO) 20 MG TABS tablet Take 1 tablet (20 mg total) by mouth daily with supper. 06/29/18  Yes End, Cristal Deerhristopher, MD  spironolactone (ALDACTONE) 25 MG tablet Take 1 tablet (25 mg total) by mouth daily. 07/04/18  Yes Johnson, Megan P, DO  Thiamine HCl (B-1) 250 MG TABS Take 1 tablet by mouth daily.   Yes [provider]  torsemide (DEMADEX) 20 MG tablet Take 2 tablets (40 mg total) by mouth daily. 07/23/18  Yes Katha HammingKonidena, Snehalatha, MD  traZODone (DESYREL) 150  MG tablet Take 1 tablet (150 mg total) by mouth at bedtime as needed for sleep. 06/06/18  Yes Johnson, Megan P, DO  albuterol (VENTOLIN HFA) 108 (90 Base) MCG/ACT inhaler Inhale 2 puffs into the lungs every 6 (six) hours as needed for wheezing or shortness of breath. 07/04/18   Johnson, Megan P, DO  fluticasone (FLONASE) 50 MCG/ACT nasal spray Place 2 sprays into both nostrils 2 (two) times daily. 06/01/18   Salary, Evelena AsaMontell D, MD  ketotifen (ZADITOR) 0.025 % ophthalmic solution 1 drop 2 (two) times daily.    [provider]  loratadine (CLARITIN) 10 MG tablet Take 10 mg by mouth daily.    [provider]  nitroGLYCERIN (NITROSTAT) 0.4 MG SL tablet Place 1 tablet (0.4 mg total) under the tongue every 5 (five) minutes as needed for chest pain. 01/18/18   Auburn BilberryPatel, Shreyang, MD    Inpatient Medications: Scheduled Meds:  allopurinol  100 mg Oral Daily   atorvastatin  40 mg Oral q1800   ferrous sulfate  325 mg Oral BID WC   fluticasone  2 spray Each Nare BID   furosemide  40 mg Intravenous BID   gabapentin  300 mg Oral TID   heparin  3,400 Units Intravenous Once   insulin aspart  0-5 Units Subcutaneous QHS   insulin aspart  0-9 Units Subcutaneous TID WC   ketotifen  1 drop Both Eyes BID   levothyroxine  25 mcg Oral QAC breakfast   lisinopril  2.5 mg Oral Daily   loratadine  10 mg Oral Daily   metoprolol succinate  25 mg Oral BID   mometasone-formoterol  2 puff Inhalation BID   potassium chloride SA  20 mEq Oral BID   spironolactone  25 mg Oral Daily   thiamine  100 mg Oral Daily   Continuous Infusions:  sodium chloride 250 mL (08/07/18 0705)   ceFEPime (MAXIPIME) IV 2 g (08/07/18 0709)   heparin 1,600 Units/hr (08/07/18 0536)   vancomycin 1,750 mg (08/07/18 0841)   PRN Meds: sodium chloride, acetaminophen **OR** acetaminophen, albuterol, morphine injection, nitroGLYCERIN, ondansetron **OR** ondansetron (ZOFRAN) IV, oxyCODONE, traZODone  Allergies:  No  Known Allergies  Social History:   Social History   Socioeconomic History   Marital status: Single    Spouse name: Not on file   Number of children: Not on file   Years of education: Not on file   Highest education level: Not on file  Occupational History   Not on file  Social Needs   Financial resource strain: Not on file   Food insecurity    Worry: Not on file    Inability: Not on file   Transportation needs    Medical: Not on file    Non-medical: Not on file  Tobacco Use   Smoking status: Never Smoker   Smokeless tobacco: Never Used  Substance and Sexual Activity   Alcohol use: Never    Frequency: Never   Drug use: Never   Sexual activity: Not Currently  Lifestyle   Physical activity    Days per week: Not on file    Minutes per session: Not on file   Stress: Not on file  Relationships   Social connections    Talks on phone: Not on file    Gets together: Not on file    Attends religious service: Not on file    Active member of club or organization: Not on file    Attends meetings of clubs or organizations: Not on file    Relationship status: Not on file   Intimate partner violence    Fear of current or ex partner: Not on file    Emotionally abused: Not on file    Physically abused: Not on file    Forced sexual activity: Not on file  Other Topics Concern   Not on file  Social History Narrative   Not on file     Family History:   Family History  Problem Relation Age of Onset   Heart failure Mother    Lung cancer Mother    Lung cancer Father    Heart attack Maternal Grandmother    Heart attack Maternal Grandfather     ROS:  Review of Systems  Constitutional: Positive for malaise/fatigue. Negative for chills, diaphoresis, fever and weight loss.       Decreased appetite   HENT: Negative for congestion.   Eyes: Negative for discharge and  redness.  Respiratory: Positive for shortness of breath. Negative for cough, hemoptysis,  sputum production and wheezing.   Cardiovascular: Positive for orthopnea and leg swelling. Negative for chest pain, palpitations, claudication and PND.  Gastrointestinal: Negative for abdominal pain, blood in stool, heartburn, melena, nausea and vomiting.  Genitourinary: Negative for hematuria.  Musculoskeletal: Negative for falls and myalgias.  Skin: Negative for rash.  Neurological: Positive for weakness. Negative for dizziness, tingling, tremors, sensory change, speech change, focal weakness and loss of consciousness.  Endo/Heme/Allergies: Does not bruise/bleed easily.  Psychiatric/Behavioral: Negative for substance abuse. The patient is not nervous/anxious.   All other systems reviewed and are negative.     Physical Exam/Data:   Vitals:   08/06/18 2302 08/07/18 0042 08/07/18 0151 08/07/18 0719  BP: (!) 137/56 129/60 137/64 124/85  Pulse:  77 73 84  Resp: 19 18 20 19   Temp:   99.8 F (37.7 C) 98.2 F (36.8 C)  TempSrc:   Oral Oral  SpO2:  99% 99% 100%  Weight:   (!) 182.8 kg   Height:        Intake/Output Summary (Last 24 hours) at 08/07/2018 0903 Last data filed at 08/07/2018 2831 Gross per 24 hour  Intake 206.98 ml  Output 3150 ml  Net -2943.02 ml   Filed Weights   08/06/18 1316 08/07/18 0151  Weight: (!) 181.4 kg (!) 182.8 kg   Body mass index is 61.28 kg/m.   Physical Exam: General: Well developed, well nourished, in no acute distress. Head: Normocephalic, atraumatic, sclera non-icteric, no xanthomas, nares without discharge.  Neck: Negative for carotid bruits. JVD difficult to estimate secondary to body habitus. Lungs: Diminished breath sounds bilaterally. Breathing is unlabored. Heart: Irregularly irregular with S1 S2. No murmurs, rubs, or gallops appreciated. Abdomen: Soft, non-tender, distended with normoactive bowel sounds. No hepatomegaly. No rebound/guarding. No obvious abdominal masses. Msk:  Strength and tone appear normal for age. Extremities: No  clubbing or cyanosis.1-2+ bilateral pitting edema. Distal pedal pulses are 2+ and equal bilaterally. Neuro: Alert and oriented X 3. No facial asymmetry. No focal deficit. Moves all extremities spontaneously. Psych:  Responds to questions appropriately with a normal affect.   EKG:  The EKG was personally reviewed and demonstrates: Afib with RVR, 103 bpm, LBBB (known) Telemetry:  Telemetry was personally reviewed and demonstrates: Afib with ventricular rates ranging from the 80s to 1-teens bpm  Weights: Filed Weights   08/06/18 1316 08/07/18 0151  Weight: (!) 181.4 kg (!) 182.8 kg    Relevant CV Studies: 2D Echo 03/2018: 1. The left ventricle has severely reduced systolic function of 51-76%. The cavity size was mildly increased. There is no increased left ventricular wall thickness. Left ventricular diastology could not be evaluated due to nondiagnostic images. Left ventricular diffuse hypokinesis.  2. Left atrial size was moderately dilated.  3. Not assessed.  4. The mitral valve was not assessed. There is mild thickening and mild calcification. Mitral valve regurgitation was not significant.  5. The tricuspid valve is not assessed. Tricuspid valve regurgitation was not assessed by color flow Doppler.  6. The aortic valve was not assessed.  7. The interatrial septum was not well visualized.  Laboratory Data:  Chemistry Recent Labs  Lab 08/06/18 1320 08/07/18 0536  NA 138 136  K 4.2 3.8  CL 103 99  CO2 22 31  GLUCOSE 129* 110*  BUN 16 15  CREATININE 0.95 1.05  CALCIUM 8.3* 8.2*  GFRNONAA >60 >60  GFRAA >60 >60  ANIONGAP 13 6  Recent Labs  Lab 08/06/18 1320  PROT 7.1  ALBUMIN 3.5  AST 19  ALT 11  ALKPHOS 74  BILITOT 0.3   Hematology Recent Labs  Lab 08/06/18 1320 08/07/18 0536  WBC 8.2 6.0  RBC 4.33 4.11*  HGB 11.2* 10.7*  HCT 36.8* 34.8*  MCV 85.0 84.7  MCH 25.9* 26.0  MCHC 30.4 30.7  RDW 22.0* 22.0*  PLT 251 231   Cardiac Enzymes Recent Labs  Lab  08/06/18 1320 08/06/18 1645 08/07/18 0021  TROPONINI <0.03 0.04* 0.04*   No results for input(s): TROPIPOC in the last 168 hours.  BNP Recent Labs  Lab 08/06/18 1320  BNP 173.0*    DDimer No results for input(s): DDIMER in the last 168 hours.  Radiology/Studies:  Dg Chest Portable 1 View  Result Date: 08/06/2018 IMPRESSION: Stable cardiomegaly with suggestion mild vascular congestion. Electronically Signed   By: Elberta Fortis M.D.   On: 08/06/2018 15:12    Assessment and Plan:   1. Acute on chronic HFrEF secondary to presumed NICM: -He continues to be volume up -Continue IV diuresis with Lasix 40 mg bid and KCl repletion as indicated  -Continue Toprol XL, spironolactone and losartan  -In outpatient follow up, consider transition from losartan to Entresto  -CHF education  -Daily weights -Strict I/O  2. Elevated troponin: -Minimally elevated and flat trending likely secondary to supply demand ischemia in the setting of volume overload, Afib with RVR and anemia -No prior records for review in Care Everywhere -Order placed for record release from [facility] in [place] -No plans for inpatient ischemic evaluation -Following diuresis and repeat DCCV, outpatient ischemic evaluation can be considered -Can transition back to [facility] from heparin gtt  3. Persistent Afib with RVR: -Ventricular rates are overall well controlled -Continue Toprol XL  -Resume PTA amiodarone, which was not continued at hospital admission  -TSH as below with normal LFT as of 6/21 -Plan for repeat DCCV on 6/23, now that he has been loaded with amiodarone -He reports full compliance with OAC -Resume Xarelto as above in place of heparin gtt  4. Abnormal TSH: -Check free T4/totoal T3 -Will need outpatient follow up  5. Anemia: -HGB relatively stable -Monitor   6. Morbid obesity: -Weight loss advised    For questions or updates, please contact [facility] Please consult [email or web address]  for contact info under Cardiology/STEMI.   Signed, [name], PA-C [facility] Pager: [phone] 08/07/2018, 9:03 AM

## 2018-08-07 NOTE — Consult Note (Signed)
ANTICOAGULATION CONSULT NOTE - Initial Consult  Pharmacy Consult for Heparin Indication: chest pain/ACS  No Known Allergies  Patient Measurements: Height: 5\' 8"  (172.7 cm) Weight: (!) 403 lb (182.8 kg) IBW/kg (Calculated) : 68.4 Heparin Dosing Weight: 114kg  Vital Signs: Temp: 98.2 F (36.8 C) (06/22 0719) Temp Source: Oral (06/22 0719) BP: 124/85 (06/22 0719) Pulse Rate: 84 (06/22 0719)  Labs: Recent Labs    08/06/18 1320 08/06/18 1645 08/06/18 1930 08/07/18 0021 08/07/18 0536 08/07/18 0808  HGB 11.2*  --   --   --  10.7*  --   HCT 36.8*  --   --   --  34.8*  --   PLT 251  --   --   --  231  --   APTT  --   --  25 32  --   --   LABPROT  --   --  13.9  --   --   --   INR  --   --  1.1  --   --   --   HEPARINUNFRC  --   --  0.11* 0.16*  --  0.15*  CREATININE 0.95  --   --   --  1.05  --   TROPONINI <0.03 0.04*  --  0.04*  --   --     Estimated Creatinine Clearance: 125.4 mL/min (by C-G formula based on SCr of 1.05 mg/dL).   Medical History: Past Medical History:  Diagnosis Date  . Asthma   . Brain damage   . COPD (chronic obstructive pulmonary disease) ([place])   . Depression   . HFrEF (heart failure with reduced ejection fraction) ([place])    a. 03/2018 Echo: EF 25-30%, diff HK. Mod dil LA.  [name] Persistent atrial fibrillation    a. 03/2018 s/p DCCV; b. CHA2DS2VASc = 1-->Xarelto; c. 05/2018 recurrent afib-->Amio initiated;   . Sleep apnea     Medications:  Patient on Xarelto prior to admission - last dose 6/20 @ 1800  Assessment: Pharmacy has been consulted to initiate and monitor heparin drip in this 58 yo morbidly obese male with chest pain and nausea. Baseline HL was 0.11 - will use heparin level to monitor heparin. Heparin infusion started without bolus because of proximity to last Xarelto dose  Started on a rate of 1600 units/hr 6/22 @0021  HL 0.16 - no adjustment made.  6/22 @0808  HL 0.15 -  3400 unit bolus and increase rate to 1900 units/hr  Goal of  Therapy:  Heparin level 0.3-0.7 units/ml Monitor platelets by anticoagulation protocol: Yes   Plan:  Heparin level is subtherapeutic. Will give 3400 unit bolus and increase rate to 1900 units/hr. Will order heparin level in 6 hours. Daily CBC while on heparin. Pharmacy will continue to monitor and follow.   Oswald Hillock, PharmD, BCPS Clinical Pharmacist 08/07/2018 8:52 AM

## 2018-08-07 NOTE — Consult Note (Signed)
Cardiology Consultation:   Patient ID: [name] MajesticKenneth Prioleau; [id number]; 1960/07/28   Admit date: 08/06/2018 Date of Consult: 08/07/2018  Primary Care Provider: Dorcas CarrowJohnson, Megan P, DO Primary Cardiologist: End   Patient Profile:   [name] [name] is a 58 y.o. male with a hx of HFrEF secondary to presumed NICM, persistent Afib on amiodarone and Xarelto, COPD, morbid obesity, and sleep apnea on CPAP who is being seen today for the evaluation of acute on chronic HFrEF and persistent Afib at the request of Dr. Renae GlossWieting.  History of Present Illness:   [name] [name] previously resided in [place], [place] where he reported undergoing a LHC ~ 12 months prior without significant disease being noted per his report. He has subsequently relocated to the [place] area and was admitted in 03/2018 with acute on chronic HFrEF in the setting of Afib. Following diuresis, he underwent DCCV, though failed to follow up after discharge. Echo at that time showed an EF of 25-30%, mildly dilated LV, diffuse HK, moderately dilated LA. He was readmitted in 05/2018 with chest pain and SOB in the setting of recurrent Afib with concern for viral illness. He tested negative for COVID-19. He was rate controlled and started on amiodarone load. Repeat DCCV was deferred given adequate rate control and need for amiodarone loading. He was seen in telemedicine follow up on 06/08/2018 noting subjective fever and chronic arthralgias with chronic BC Powder use. He denied any exertional chest pain, though did notice exertional palpitations and dyspnea. His leg edema had improved and his weight was down to 380 pounds. He also reported occasional BRBPR and had previously noted this in the past leading to a colonoscopy that found hemorrhoids. It was noted he was taking torsemide and spironolactone on alternating days rather than daily. He was most recently admitted to the hospital in early 07/2018 with acute on chronic HFrEF and was diuresed with  IV Lasix with improvement in symptoms. It was recommended he continue to be loaded with amiodarone followed up outpatient DCCV later in 07/2018.   The patient was in his usual state of health until around 11 AM on 6/21 when he developed worsening SOB and chest tightness. He reported compliance with all medications and denied any dietary indiscretions. There was also a subjective low grade fever and nausea. He states he has not eaten well since his girlfriend died suddenly in 05/2018. His lower extremity swelling was noted to actually be somewhat improved when compared to prior.   Upon the patient's arrival to [facility] they were found to have mildly elevated BP into the 150s systolic with heart rates ranging from the 80s to 1-teens bpm, T-max 99.8, oxygen saturation 100% on room air, weight 182.8 kg. EKG showed Afib with RVR as below, CXR showed stable cardiomegaly with possible mild vascular congestion. Labs showed troponin 0.04 x 2, COVID-19 negative, BNP 173, WBC 8.2, HGB 11.2, SCr 0.95-->1.05, K+ 4.2-->3.8, AST/ALT normal, albumin 3.5, TSH 6.121, LDL 49. He was placed on heparin gtt in place of PTA Xarleto upon admission as well as IV Lasix 40 mg bid with a documented UOP of 2.9 L for the admission. He continues to note SOB as well as LUQ abdominal pain.   Past Medical History:  Diagnosis Date   Asthma    Brain damage    COPD (chronic obstructive pulmonary disease) (HCC)    Depression    HFrEF (heart failure with reduced ejection fraction) (HCC)    a. 03/2018 Echo: EF 25-30%, diff HK. Mod  dil LA.  °• Persistent atrial fibrillation   ° a. 03/2018 s/p DCCV; b. CHA2DS2VASc = 1-->Xarelto; c. 05/2018 recurrent afib-->Amio initiated;   °• Sleep apnea   ° ° °Past Surgical History:  °Procedure Laterality Date  °• CARDIOVERSION N/A 03/24/2018  ° Procedure: CARDIOVERSION;  Surgeon: Gollan, Timothy J, MD;  Location: [facility] ORS;  Service: Cardiovascular;  Laterality: N/A;  °• hearnia repair    ° X 3- total of two  surgeries  °• LEG SURGERY    °  ° °Home Meds: °Prior to Admission medications   °Medication Sig Start Date End Date Taking? Authorizing Provider  °allopurinol (ZYLOPRIM) 100 MG tablet Take 1 tablet (100 mg total) by mouth daily. 06/27/18  Yes Johnson, Megan P, DO  °atorvastatin (LIPITOR) 40 MG tablet Take 1 tablet (40 mg total) by mouth daily. 06/06/18  Yes Johnson, Megan P, DO  °budesonide-formoterol (SYMBICORT) 160-4.5 MCG/ACT inhaler Inhale 2 puffs into the lungs 2 (two) times daily. 07/04/18  Yes Johnson, Megan P, DO  °ferrous sulfate 325 (65 FE) MG tablet Take 1 tablet (325 mg total) by mouth 2 (two) times daily with a meal. 07/05/18  Yes Johnson, Megan P, DO  °gabapentin (NEURONTIN) 300 MG capsule Take 1 capsule (300 mg total) by mouth 3 (three) times daily. 07/04/18  Yes Johnson, Megan P, DO  °levothyroxine (SYNTHROID) 25 MCG tablet Take 1 tablet (25 mcg total) by mouth daily before breakfast. 07/05/18  Yes Johnson, Megan P, DO  °lisinopril (ZESTRIL) 5 MG tablet Take 0.5 tablets (2.5 mg total) by mouth daily. 06/06/18  Yes Johnson, Megan P, DO  °meloxicam (MOBIC) 15 MG tablet Take 1 tablet (15 mg total) by mouth daily. 07/04/18  Yes Johnson, Megan P, DO  °metoprolol succinate (TOPROL-XL) 25 MG 24 hr tablet Take 1 tablet (25 mg total) by mouth 2 (two) times daily. 07/23/18  Yes Konidena, Snehalatha, MD  °potassium chloride SA (K-DUR) 20 MEQ tablet Take 1 tablet (20 mEq total) by mouth 2 (two) times daily. 06/06/18  Yes Johnson, Megan P, DO  °rivaroxaban (XARELTO) 20 MG TABS tablet Take 1 tablet (20 mg total) by mouth daily with supper. 06/29/18  Yes End, Christopher, MD  °spironolactone (ALDACTONE) 25 MG tablet Take 1 tablet (25 mg total) by mouth daily. 07/04/18  Yes Johnson, Megan P, DO  °Thiamine HCl (B-1) 250 MG TABS Take 1 tablet by mouth daily.   Yes [provider]  °torsemide (DEMADEX) 20 MG tablet Take 2 tablets (40 mg total) by mouth daily. 07/23/18  Yes Konidena, Snehalatha, MD  °traZODone (DESYREL) 150  MG tablet Take 1 tablet (150 mg total) by mouth at bedtime as needed for sleep. 06/06/18  Yes Johnson, Megan P, DO  °albuterol (VENTOLIN HFA) 108 (90 Base) MCG/ACT inhaler Inhale 2 puffs into the lungs every 6 (six) hours as needed for wheezing or shortness of breath. 07/04/18   Johnson, Megan P, DO  °fluticasone (FLONASE) 50 MCG/ACT nasal spray Place 2 sprays into both nostrils 2 (two) times daily. 06/01/18   Salary, Montell D, MD  °ketotifen (ZADITOR) 0.025 % ophthalmic solution 1 drop 2 (two) times daily.    [provider]  °loratadine (CLARITIN) 10 MG tablet Take 10 mg by mouth daily.    [provider]  °nitroGLYCERIN (NITROSTAT) 0.4 MG SL tablet Place 1 tablet (0.4 mg total) under the tongue every 5 (five) minutes as needed for chest pain. 01/18/18   Patel, Shreyang, MD  ° ° °Inpatient Medications: °Scheduled Meds: °• allopurinol    100 mg Oral Daily   atorvastatin  40 mg Oral q1800   ferrous sulfate  325 mg Oral BID WC   fluticasone  2 spray Each Nare BID   furosemide  40 mg Intravenous BID   gabapentin  300 mg Oral TID   heparin  3,400 Units Intravenous Once   insulin aspart  0-5 Units Subcutaneous QHS   insulin aspart  0-9 Units Subcutaneous TID WC   ketotifen  1 drop Both Eyes BID   levothyroxine  25 mcg Oral QAC breakfast   lisinopril  2.5 mg Oral Daily   loratadine  10 mg Oral Daily   metoprolol succinate  25 mg Oral BID   mometasone-formoterol  2 puff Inhalation BID   potassium chloride SA  20 mEq Oral BID   spironolactone  25 mg Oral Daily   thiamine  100 mg Oral Daily   Continuous Infusions:  sodium chloride 250 mL (08/07/18 0705)   ceFEPime (MAXIPIME) IV 2 g (08/07/18 0709)   heparin 1,600 Units/hr (08/07/18 0536)   vancomycin 1,750 mg (08/07/18 0841)   PRN Meds: sodium chloride, acetaminophen **OR** acetaminophen, albuterol, morphine injection, nitroGLYCERIN, ondansetron **OR** ondansetron (ZOFRAN) IV, oxyCODONE, traZODone  Allergies:  No  Known Allergies  Social History:   Social History   Socioeconomic History   Marital status: Single    Spouse name: Not on file   Number of children: Not on file   Years of education: Not on file   Highest education level: Not on file  Occupational History   Not on file  Social Needs   Financial resource strain: Not on file   Food insecurity    Worry: Not on file    Inability: Not on file   Transportation needs    Medical: Not on file    Non-medical: Not on file  Tobacco Use   Smoking status: Never Smoker   Smokeless tobacco: Never Used  Substance and Sexual Activity   Alcohol use: Never    Frequency: Never   Drug use: Never   Sexual activity: Not Currently  Lifestyle   Physical activity    Days per week: Not on file    Minutes per session: Not on file   Stress: Not on file  Relationships   Social connections    Talks on phone: Not on file    Gets together: Not on file    Attends religious service: Not on file    Active member of club or organization: Not on file    Attends meetings of clubs or organizations: Not on file    Relationship status: Not on file   Intimate partner violence    Fear of current or ex partner: Not on file    Emotionally abused: Not on file    Physically abused: Not on file    Forced sexual activity: Not on file  Other Topics Concern   Not on file  Social History Narrative   Not on file     Family History:   Family History  Problem Relation Age of Onset   Heart failure Mother    Lung cancer Mother    Lung cancer Father    Heart attack Maternal Grandmother    Heart attack Maternal Grandfather     ROS:  Review of Systems  Constitutional: Positive for malaise/fatigue. Negative for chills, diaphoresis, fever and weight loss.       Decreased appetite   HENT: Negative for congestion.   Eyes: Negative for discharge and  redness.  Respiratory: Positive for shortness of breath. Negative for cough, hemoptysis,  sputum production and wheezing.   Cardiovascular: Positive for orthopnea and leg swelling. Negative for chest pain, palpitations, claudication and PND.  Gastrointestinal: Negative for abdominal pain, blood in stool, heartburn, melena, nausea and vomiting.  Genitourinary: Negative for hematuria.  Musculoskeletal: Negative for falls and myalgias.  Skin: Negative for rash.  Neurological: Positive for weakness. Negative for dizziness, tingling, tremors, sensory change, speech change, focal weakness and loss of consciousness.  Endo/Heme/Allergies: Does not bruise/bleed easily.  Psychiatric/Behavioral: Negative for substance abuse. The patient is not nervous/anxious.   All other systems reviewed and are negative.     Physical Exam/Data:   Vitals:   08/06/18 2302 08/07/18 0042 08/07/18 0151 08/07/18 0719  BP: (!) 137/56 129/60 137/64 124/85  Pulse:  77 73 84  Resp: 19 18 20 19   Temp:   99.8 F (37.7 C) 98.2 F (36.8 C)  TempSrc:   Oral Oral  SpO2:  99% 99% 100%  Weight:   (!) 182.8 kg   Height:        Intake/Output Summary (Last 24 hours) at 08/07/2018 0903 Last data filed at 08/07/2018 2831 Gross per 24 hour  Intake 206.98 ml  Output 3150 ml  Net -2943.02 ml   Filed Weights   08/06/18 1316 08/07/18 0151  Weight: (!) 181.4 kg (!) 182.8 kg   Body mass index is 61.28 kg/m.   Physical Exam: General: Well developed, well nourished, in no acute distress. Head: Normocephalic, atraumatic, sclera non-icteric, no xanthomas, nares without discharge.  Neck: Negative for carotid bruits. JVD difficult to estimate secondary to body habitus. Lungs: Diminished breath sounds bilaterally. Breathing is unlabored. Heart: Irregularly irregular with S1 S2. No murmurs, rubs, or gallops appreciated. Abdomen: Soft, non-tender, distended with normoactive bowel sounds. No hepatomegaly. No rebound/guarding. No obvious abdominal masses. Msk:  Strength and tone appear normal for age. Extremities: No  clubbing or cyanosis.1-2+ bilateral pitting edema. Distal pedal pulses are 2+ and equal bilaterally. Neuro: Alert and oriented X 3. No facial asymmetry. No focal deficit. Moves all extremities spontaneously. Psych:  Responds to questions appropriately with a normal affect.   EKG:  The EKG was personally reviewed and demonstrates: Afib with RVR, 103 bpm, LBBB (known) Telemetry:  Telemetry was personally reviewed and demonstrates: Afib with ventricular rates ranging from the 80s to 1-teens bpm  Weights: Filed Weights   08/06/18 1316 08/07/18 0151  Weight: (!) 181.4 kg (!) 182.8 kg    Relevant CV Studies: 2D Echo 03/2018: 1. The left ventricle has severely reduced systolic function of 51-76%. The cavity size was mildly increased. There is no increased left ventricular wall thickness. Left ventricular diastology could not be evaluated due to nondiagnostic images. Left ventricular diffuse hypokinesis.  2. Left atrial size was moderately dilated.  3. Not assessed.  4. The mitral valve was not assessed. There is mild thickening and mild calcification. Mitral valve regurgitation was not significant.  5. The tricuspid valve is not assessed. Tricuspid valve regurgitation was not assessed by color flow Doppler.  6. The aortic valve was not assessed.  7. The interatrial septum was not well visualized.  Laboratory Data:  Chemistry Recent Labs  Lab 08/06/18 1320 08/07/18 0536  NA 138 136  K 4.2 3.8  CL 103 99  CO2 22 31  GLUCOSE 129* 110*  BUN 16 15  CREATININE 0.95 1.05  CALCIUM 8.3* 8.2*  GFRNONAA >60 >60  GFRAA >60 >60  ANIONGAP 13 6  Recent Labs  Lab 08/06/18 1320  PROT 7.1  ALBUMIN 3.5  AST 19  ALT 11  ALKPHOS 74  BILITOT 0.3   Hematology Recent Labs  Lab 08/06/18 1320 08/07/18 0536  WBC 8.2 6.0  RBC 4.33 4.11*  HGB 11.2* 10.7*  HCT 36.8* 34.8*  MCV 85.0 84.7  MCH 25.9* 26.0  MCHC 30.4 30.7  RDW 22.0* 22.0*  PLT 251 231   Cardiac Enzymes Recent Labs  Lab  08/06/18 1320 08/06/18 1645 08/07/18 0021  TROPONINI <0.03 0.04* 0.04*   No results for input(s): TROPIPOC in the last 168 hours.  BNP Recent Labs  Lab 08/06/18 1320  BNP 173.0*    DDimer No results for input(s): DDIMER in the last 168 hours.  Radiology/Studies:  Dg Chest Portable 1 View  Result Date: 08/06/2018 IMPRESSION: Stable cardiomegaly with suggestion mild vascular congestion. Electronically Signed   By: Elberta Fortis M.D.   On: 08/06/2018 15:12    Assessment and Plan:   1. Acute on chronic HFrEF secondary to presumed NICM: -He continues to be volume up -Continue IV diuresis with Lasix 40 mg bid and KCl repletion as indicated  -Continue Toprol XL, spironolactone and losartan  -In outpatient follow up, consider transition from losartan to Entresto  -CHF education  -Daily weights -Strict I/O  2. Elevated troponin: -Minimally elevated and flat trending likely secondary to supply demand ischemia in the setting of volume overload, Afib with RVR and anemia -No prior records for review in Care Everywhere -Order placed for record release from [facility] in [place] -No plans for inpatient ischemic evaluation -Following diuresis and repeat DCCV, outpatient ischemic evaluation can be considered -Can transition back to [facility] from heparin gtt  3. Persistent Afib with RVR: -Ventricular rates are overall well controlled -Continue Toprol XL  -Resume PTA amiodarone, which was not continued at hospital admission  -TSH as below with normal LFT as of 6/21 -Plan for repeat DCCV on 6/23, now that he has been loaded with amiodarone -He reports full compliance with OAC -Resume Xarelto as above in place of heparin gtt  4. Abnormal TSH: -Check free T4/totoal T3 -Will need outpatient follow up  5. Anemia: -HGB relatively stable -Monitor   6. Morbid obesity: -Weight loss advised    For questions or updates, please contact [facility] Please consult [email or web address]  for contact info under Cardiology/STEMI.   Signed, [name], PA-C [facility] Pager: [phone] 08/07/2018, 9:03 AM

## 2018-08-07 NOTE — Consult Note (Addendum)
[place] Nurse wound consult note Reason for Consult:Nonhealing venous wounds to bilateral lower legs./  Wears Unna boots  Changed twice weekly by [place].  Wound type:venous Pressure Injury POA: NA Measurement: left lateral:  4 cmx 2.5 cm x 0.3 cm  Right anterior lower leg:  2 cm x 2.5 cm x 0.2 cm  Wound ZTI:WPYKD red Drainage (amount, consistency, odor) moderate to wounds.  Will add Xeroform to nonintact lesions Periwound:Erythema and chronic skin changes.  Dressing procedure/placement/frequency:Cleanse bilateral lower legs with soap and water and pat dry.  Apply Xeroform to nonintact wounds.Wrap with zinc layer and secure with self adherent coban from below toes to below knee.    Change Monday/thursday Will not follow at this time.  Please re-consult if needed.  Domenic Moras MSN, RN, FNP-BC CWON Wound, Ostomy, Continence Nurse Pager [phone]

## 2018-08-07 NOTE — Progress Notes (Signed)
[employer] - [place] at [facility]   PATIENT NAME: [name] [name]    MR#:  [id number]  DATE OF BIRTH:  [birth year]  SUBJECTIVE:  CHIEF COMPLAINT:   Chief Complaint  Patient presents with  . Chest Pain  . Shortness of Breath   - feels tired, complains of headache, nausea and chest pain  REVIEW OF SYSTEMS:  Review of Systems  Constitutional: Positive for malaise/fatigue. Negative for chills and fever.  HENT: Negative for congestion, ear discharge, hearing loss and nosebleeds.   Respiratory: Positive for shortness of breath. Negative for cough and wheezing.   Cardiovascular: Positive for chest pain and leg swelling. Negative for palpitations.  Gastrointestinal: Positive for nausea. Negative for abdominal pain, constipation, diarrhea and vomiting.  Genitourinary: Negative for dysuria.  Musculoskeletal: Negative for myalgias.  Neurological: Positive for headaches. Negative for dizziness, focal weakness, seizures and weakness.  Psychiatric/Behavioral: Negative for depression.    DRUG ALLERGIES:  No Known Allergies  VITALS:  Blood pressure 124/85, pulse 84, temperature 98.2 F (36.8 C), temperature source Oral, resp. rate 19, height 5\' 8"  (1.727 m), weight (!) 182.8 kg, SpO2 100 %.  PHYSICAL EXAMINATION:  Physical Exam   GENERAL:  58 y.o.-year-old obese patient lying in the bed with no acute distress.  EYES: Pupils equal, round, reactive to light and accommodation. No scleral icterus. Extraocular muscles intact.  HEENT: Head atraumatic, normocephalic. Oropharynx and nasopharynx clear.  NECK:  Supple, no jugular venous distention. No thyroid enlargement, no tenderness.  LUNGS: Scant breath sounds bilaterally, no wheezing, rales,rhonchi or crepitation. No use of accessory muscles of respiration.  Decreased at the bases CARDIOVASCULAR: S1, S2 normal. No murmurs, rubs, or gallops.  ABDOMEN: Soft, nontender, nondistended. Bowel sounds present. No organomegaly or  mass.  EXTREMITIES: No  cyanosis, or clubbing.  Significant bilateral lower extremity edema and erythema in the lower one third of the leg with some open wounds NEUROLOGIC: Cranial nerves II through XII are intact. Muscle strength 5/5 in all extremities. Sensation intact. Gait not checked.  PSYCHIATRIC: The patient is alert and oriented x 3.  SKIN: No obvious rash, lesion, or ulcer.    LABORATORY PANEL:   CBC Recent Labs  Lab 08/07/18 0536  WBC 6.0  HGB 10.7*  HCT 34.8*  PLT 231   ------------------------------------------------------------------------------------------------------------------  Chemistries  Recent Labs  Lab 08/06/18 1320 08/07/18 0536  NA 138 136  K 4.2 3.8  CL 103 99  CO2 22 31  GLUCOSE 129* 110*  BUN 16 15  CREATININE 0.95 1.05  CALCIUM 8.3* 8.2*  AST 19  --   ALT 11  --   ALKPHOS 74  --   BILITOT 0.3  --    ------------------------------------------------------------------------------------------------------------------  Cardiac Enzymes Recent Labs  Lab 08/07/18 0021  TROPONINI 0.04*   ------------------------------------------------------------------------------------------------------------------  RADIOLOGY:  Dg Chest Portable 1 View  Result Date: 08/06/2018 CLINICAL DATA:  Chest pain and shortness of breath today. EXAM: PORTABLE CHEST 1 VIEW COMPARISON:  07/18/2018 FINDINGS: Lordotic technique is demonstrated. Lungs are adequately inflated and demonstrate mild stable cardiomegaly with minimal vascular congestion. No lobar consolidation, effusion or pneumothorax. Remainder the exam is unchanged. IMPRESSION: Stable cardiomegaly with suggestion mild vascular congestion. Electronically Signed   By: Elberta Fortis M.D.   On: 08/06/2018 15:12    EKG:   Orders placed or performed during the hospital encounter of 08/06/18  . ED EKG  . ED EKG  . EKG 12-Lead  . EKG 12-Lead    ASSESSMENT AND PLAN:  58 year old male with past medical history  significant for congestive heart failure secondary to nonischemic cardiomyopathy, persistent atrial fibrillation on Xarelto, COPD, sleep apnea on CPAP, morbid obesity presents to hospital secondary to chest pain and worsening shortness of breath and lower extremity wounds.  1.  Acute on chronic diastolic heart failure-known history of presumed nonischemic cardiomyopathy. -Admitted to telemetry floor, continue IV Lasix. -Cardiology consulted.  Daily weights, strict input and output monitoring. -Currently on room air  2.  Atrial fibrillation with rapid ventricular response-heart rate seems to be better controlled today -Continue amiodarone.  Also on Xarelto at home which will be resumed. -Possible plans for cardioversion if not improving  3.  Lower extremity edema and wounds-secondary to skin bacteria, large of skin blisters that opened up due to worsening edema. -Currently on broad-spectrum antibiotics.  Blood cultures are negative, discontinue cefepime and continue only vancomycin -Recommend outpatient follow-up with wound care clinic  4.  Hypertension-continue Toprol, lisinopril and Aldactone  5.  DVT prophylaxis-patient on Xarelto     All the records are reviewed and case discussed with Care Management/Social Workerr. Management plans discussed with the patient, family and they are in agreement.  CODE STATUS: Full code  TOTAL TIME TAKING CARE OF THIS PATIENT: 38 minutes.   POSSIBLE D/C IN 2 DAYS, DEPENDING ON CLINICAL CONDITION.   Gladstone Lighter M.D on 08/07/2018 at 1:08 PM  Between 7am to 6pm - Pager - [phone]  After 6pm go to [email or web address] - password [email or web address] [facility]  Office  [phone]  CC: Primary care physician; [name], DO

## 2018-08-08 ENCOUNTER — Telehealth: Payer: Self-pay

## 2018-08-08 ENCOUNTER — Encounter: Payer: Self-pay | Admitting: *Deleted

## 2018-08-08 ENCOUNTER — Inpatient Hospital Stay: Payer: Medicaid Other | Admitting: Anesthesiology

## 2018-08-08 ENCOUNTER — Encounter
Admission: EM | Disposition: A | Discharge status: Home Health | Payer: Self-pay | Source: Home / Self Care | Attending: Internal Medicine

## 2018-08-08 HISTORY — PX: CARDIOVERSION: SHX1299

## 2018-08-08 LAB — BASIC METABOLIC PANEL
Anion gap: 9 (ref 5–15)
BUN: 15 mg/dL (ref 6–20)
CO2: 27 mmol/L (ref 22–32)
Calcium: 8.8 mg/dL — ABNORMAL LOW (ref 8.9–10.3)
Chloride: 98 mmol/L (ref 98–111)
Creatinine, Ser: 1.05 mg/dL (ref 0.61–1.24)
GFR calc Af Amer: >60 mL/min (ref >60)
GFR calc non Af Amer: >60 mL/min (ref >60)
Glucose, Bld: 119 mg/dL — ABNORMAL HIGH (ref 70–99)
Potassium: 4.4 mmol/L (ref 3.5–5.1)
Sodium: 134 mmol/L — ABNORMAL LOW (ref 135–145)

## 2018-08-08 LAB — GLUCOSE, CAPILLARY
Glucose-Capillary: 127 mg/dL — ABNORMAL HIGH (ref 70–99)
Glucose-Capillary: 100 mg/dL — ABNORMAL HIGH (ref 70–99)
Glucose-Capillary: 100 mg/dL — ABNORMAL HIGH (ref 70–99)

## 2018-08-08 SURGERY — CARDIOVERSION
Anesthesia: General

## 2018-08-08 MED ORDER — VANCOMYCIN HCL 10 G IV SOLR
1500.0000 mg | Freq: Two times a day (BID) | INTRAVENOUS | Status: DC
  Filled 2018-08-08 (×3): qty 1500

## 2018-08-08 MED ORDER — LISINOPRIL 5 MG PO TABS
5.0000 mg | ORAL_TABLET | Freq: Every day | ORAL | Status: DC
  Administered 2018-08-09 – 2018-08-10 (×2): 5 mg via ORAL
  Filled 2018-08-08 (×2): qty 1

## 2018-08-08 MED ORDER — PROPOFOL 10 MG/ML IV BOLUS
INTRAVENOUS | Status: DC | PRN
  Administered 2018-08-08 (×2): 70 mg via INTRAVENOUS
  Administered 2018-08-08: 50 mg via INTRAVENOUS
  Administered 2018-08-08: 30 mg via INTRAVENOUS
  Administered 2018-08-08: 50 mg via INTRAVENOUS

## 2018-08-08 MED ORDER — VANCOMYCIN HCL 1.5 G IV SOLR
1500.0000 mg | Freq: Two times a day (BID) | INTRAVENOUS | Status: DC
  Administered 2018-08-09 (×2): 1500 mg via INTRAVENOUS
  Filled 2018-08-08 (×4): qty 1500

## 2018-08-08 NOTE — CV Procedure (Signed)
    Cardioversion Note  [name] [name] [id number] August 19, 1960  Procedure: DC Cardioversion Indications: Persistent atrial fibrillation  Procedure Details Consent: Obtained Time Out: Verified patient identification, verified procedure, site/side was marked, verified correct patient position, special equipment/implants available, Radiology Safety Procedures followed,  medications/allergies/relevent history reviewed, required imaging and test results available.  Performed  The patient has been on adequate anticoagulation.  The patient received IV propofol by anesthesia for sedation.  Synchronous cardioversion was performed at 200 joules x 2 with continued atrial fibrillation.  An additional set of pads was applied and a second defibrillator attached.  Simultaneous synchronized cardioversion with two defibrillators at 200 joules each (total 400 joules) was performed.  The cardioversion was successful with restoration of normal sinus rhythm.  Complications: No apparent complications Patient did tolerate procedure well.  Nelva Bush., MD 08/08/2018, 9:15 AM

## 2018-08-08 NOTE — Transfer of Care (Signed)
Immediate Anesthesia Transfer of Care Note  Patient: [name] [name]  Procedure(s) Performed: CARDIOVERSION (N/A )  Patient Location: Short Stay  Anesthesia Type:General  Level of Consciousness: awake and alert   Airway & Oxygen Therapy: Patient Spontanous Breathing and Patient connected to nasal cannula oxygen  Post-op Assessment: Report given to RN and Post -op Vital signs reviewed and stable  Post vital signs: Reviewed  Last Vitals:  Vitals Value Taken Time  BP 104/40 08/08/18 0913  Temp    Pulse 77 08/08/18 0914  Resp 16 08/08/18 0914  SpO2 95 % 08/08/18 0914  Vitals shown include unvalidated device data.  Last Pain:  Vitals:   08/08/18 0830  TempSrc: Oral  PainSc: 7       Patients Stated Pain Goal: 5 (77/11/65 7903)  Complications: No apparent anesthesia complications

## 2018-08-08 NOTE — Interval H&P Note (Signed)
History and Physical Interval Note:  08/08/2018 8:42 AM  [name] [name]  has presented today for cardioversion, with the diagnosis of persistent atrial fibrillation.  The various methods of treatment have been discussed with the patient and family. After consideration of risks, benefits and other options for treatment, the patient has consented to  Procedure(s): CARDIOVERSION (N/A) as a surgical intervention.  The patient's history has been reviewed, patient examined, no change in status, stable for surgery.  I have reviewed the patient's chart and labs.  Questions were answered to the patient's satisfaction.     [name]

## 2018-08-08 NOTE — Anesthesia Post-op Follow-up Note (Signed)
Anesthesia QCDR form completed.        

## 2018-08-08 NOTE — Progress Notes (Signed)
[employer] - [facility] [place] [facility]   PATIENT NAME: [name] [name]    MR#:  [id number]  DATE OF BIRTH:  [birth year]  SUBJECTIVE:  CHIEF COMPLAINT:   Chief Complaint  Patient presents with  . Chest Pain  . Shortness of Breath   -Feels much better today.  Status post cardioversion for his A. fib successfully today. -Continue IV diuresis.  Breathing and leg swelling are improved.  REVIEW OF SYSTEMS:  Review of Systems  Constitutional: Positive for malaise/fatigue. Negative for chills and fever.  HENT: Negative for congestion, ear discharge, hearing loss and nosebleeds.   Respiratory: Positive for shortness of breath. Negative for cough and wheezing.   Cardiovascular: Positive for chest pain and leg swelling. Negative for palpitations.  Gastrointestinal: Negative for abdominal pain, constipation, diarrhea, nausea and vomiting.  Genitourinary: Negative for dysuria.  Musculoskeletal: Negative for myalgias.  Neurological: Negative for dizziness, focal weakness, seizures, weakness and headaches.  Psychiatric/Behavioral: Negative for depression.    DRUG ALLERGIES:  No Known Allergies  VITALS:  Blood pressure 130/64, pulse 71, temperature 98.6 F (37 C), temperature source Oral, resp. rate 16, height 5\' 8"  (1.727 m), weight (!) 182.8 kg, SpO2 100 %.  PHYSICAL EXAMINATION:  Physical Exam   GENERAL:  58 y.o.-year-old obese patient lying in the bed with no acute distress.  EYES: Pupils equal, round, reactive to light and accommodation. No scleral icterus. Extraocular muscles intact.  HEENT: Head atraumatic, normocephalic. Oropharynx and nasopharynx clear.  NECK:  Supple, no jugular venous distention. No thyroid enlargement, no tenderness.  LUNGS: Scant breath sounds bilaterally, no wheezing, rales,rhonchi or crepitation. No use of accessory muscles of respiration.  Decreased at the bases CARDIOVASCULAR: S1, S2 normal. No murmurs, rubs, or gallops.  ABDOMEN: Soft,  nontender, nondistended. Bowel sounds present. No organomegaly or mass.  EXTREMITIES: No  cyanosis, or clubbing.  Significant bilateral lower extremity edema and erythema in the lower one third of the leg with some open wounds- unna boots present today NEUROLOGIC: Cranial nerves II through XII are intact. Muscle strength 5/5 in all extremities. Sensation intact. Gait not checked.  PSYCHIATRIC: The patient is alert and oriented x 3.  SKIN: No obvious rash, lesion, or ulcer.    LABORATORY PANEL:   CBC Recent Labs  Lab 08/07/18 0536  WBC 6.0  HGB 10.7*  HCT 34.8*  PLT 231   ------------------------------------------------------------------------------------------------------------------  Chemistries  Recent Labs  Lab 08/06/18 1320  08/08/18 0701  NA 138   < > 134*  K 4.2   < > 4.4  CL 103   < > 98  CO2 22   < > 27  GLUCOSE 129*   < > 119*  BUN 16   < > 15  CREATININE 0.95   < > 1.05  CALCIUM 8.3*   < > 8.8*  AST 19  --   --   ALT 11  --   --   ALKPHOS 74  --   --   BILITOT 0.3  --   --    < > = values in this interval not displayed.   ------------------------------------------------------------------------------------------------------------------  Cardiac Enzymes Recent Labs  Lab 08/07/18 0021  TROPONINI 0.04*   ------------------------------------------------------------------------------------------------------------------  RADIOLOGY:  Dg Chest Portable 1 View  Result Date: 08/06/2018 CLINICAL DATA:  Chest pain and shortness of breath today. EXAM: PORTABLE CHEST 1 VIEW COMPARISON:  07/18/2018 FINDINGS: Lordotic technique is demonstrated. Lungs are adequately inflated and demonstrate mild stable cardiomegaly with minimal vascular congestion. No lobar  consolidation, effusion or pneumothorax. Remainder the exam is unchanged. IMPRESSION: Stable cardiomegaly with suggestion mild vascular congestion. Electronically Signed   By: Marin Olp M.D.   On: 08/06/2018 15:12     EKG:   Orders placed or performed during the hospital encounter of 08/06/18  . ED EKG  . ED EKG  . EKG 12-Lead  . EKG 12-Lead  . EKG 12-Lead  . EKG 12-Lead    ASSESSMENT AND PLAN:   58 year old male with past medical history significant for congestive heart failure secondary to nonischemic cardiomyopathy, persistent atrial fibrillation on Xarelto, COPD, sleep apnea on CPAP, morbid obesity presents to hospital secondary to chest pain and worsening shortness of breath and lower extremity wounds.  1.  Acute on chronic diastolic heart failure-known history of presumed nonischemic cardiomyopathy. -Admitted to telemetry floor, continue IV Lasix.  Likely oral Lasix tomorrow -Daily weights, strict input and output monitoring. -Appreciate cardiology consult -Currently on room air  2.  Atrial fibrillation with rapid ventricular response-status post DC cardioversion successfully today. -Continue amiodarone.  Also on Xarelto at home. -Also on Toprol  3.  Lower extremity edema and wounds-secondary to skin bacteria, large of skin blisters that opened up due to worsening edema. -Secondary to his lymphedema.  Currently on vancomycin. -Appreciate wound consult and Unna boots placed.  Discharge on doxycycline. -Recommend outpatient follow-up with wound care clinic  4.  Hypertension-continue Toprol, lisinopril and Aldactone  5.  DVT prophylaxis-patient on Xarelto   PT consult    All the records are reviewed and case discussed with Care Management/Social Workerr. Management plans discussed with the patient, family and they are in agreement.  CODE STATUS: Full code  TOTAL TIME TAKING CARE OF THIS PATIENT: 38 minutes.   POSSIBLE D/C IN 2 DAYS, DEPENDING ON CLINICAL CONDITION.   Gladstone Lighter M.D on 08/08/2018 at 2:35 PM  Between 7am to 6pm - Pager - [phone]  After 6pm go to [email or web address] - password [email or web address] [facility]  Office  [phone]  CC:  Primary care physician; [name], DO

## 2018-08-08 NOTE — Progress Notes (Signed)
Progress Note  Patient Name: [name] [name] Date of Encounter: 08/08/2018  Primary Cardiologist: [name], MD  Subjective   Patient feels better today with less shortness of breath and leg swelling.  He still has vague chest discomfort, which he reports having for the last 10 years.  He tolerated cardioversion well.  Inpatient Medications    Scheduled Meds: . allopurinol  100 mg Oral Daily  . amiodarone  200 mg Oral Daily  . atorvastatin  40 mg Oral q1800  . ferrous sulfate  325 mg Oral BID WC  . fluticasone  2 spray Each Nare BID  . furosemide  40 mg Intravenous BID  . gabapentin  300 mg Oral TID  . insulin aspart  0-5 Units Subcutaneous QHS  . insulin aspart  0-9 Units Subcutaneous TID WC  . ketotifen  1 drop Both Eyes BID  . levothyroxine  25 mcg Oral QAC breakfast  . [START ON 08/09/2018] lisinopril  5 mg Oral Daily  . loratadine  10 mg Oral Daily  . metoprolol succinate  25 mg Oral BID  . mometasone-formoterol  2 puff Inhalation BID  . potassium chloride SA  20 mEq Oral BID  . rivaroxaban  20 mg Oral Daily  . sodium chloride flush  3 mL Intravenous Q12H  . spironolactone  25 mg Oral Daily  . thiamine  100 mg Oral Daily   Continuous Infusions: . sodium chloride 250 mL (08/08/18 0635)  . ceFEPime (MAXIPIME) IV 2 g (08/08/18 0636)  . vancomycin     PRN Meds: sodium chloride, acetaminophen **OR** acetaminophen, albuterol, morphine injection, nitroGLYCERIN, ondansetron **OR** ondansetron (ZOFRAN) IV, oxyCODONE, traZODone   Vital Signs    Vitals:   08/08/18 0932 08/08/18 0945 08/08/18 1000 08/08/18 1016  BP: (!) 110/47 129/64 130/64   Pulse: 78 73 71 71  Resp: 18 20 18 16   Temp:      TempSrc:      SpO2: 100% 100% 100% 100%  Weight:      Height:        Intake/Output Summary (Last 24 hours) at 08/08/2018 1244 Last data filed at 08/08/2018 1236 Gross per 24 hour  Intake 680 ml  Output 3000 ml  Net -2320 ml   Last 3 Weights 08/07/2018 08/06/2018  07/23/2018  Weight (lbs) 403 lb 400 lb 408 lb 1.6 oz  Weight (kg) 182.8 kg 181.439 kg 185.113 kg      Telemetry    Atrial fibrillation with successful restoration of sinus rhythm by DCCV today - Personally Reviewed  ECG    Normal sinus rhythm with left bundle branch block - Personally Reviewed  Physical Exam   GEN: No acute distress.   Neck:  Unable to assess JVP due to body habitus. Cardiac:  Distant heart sounds.  Regular rate and rhythm without murmurs. Respiratory: Clear to auscultation bilaterally. GI: Soft, nontender, non-distended  MS: Legs are wrapped with Kerlix.  1+ calf edema still present. Neuro:  Nonfocal  Psych: Normal affect   Labs    High Sensitivity Troponin:  No results for input(s): TROPONINIHS in the last 720 hours.    Cardiac Enzymes Recent Labs  Lab 08/06/18 1320 08/06/18 1645 08/07/18 0021  TROPONINI <0.03 0.04* 0.04*   No results for input(s): TROPIPOC in the last 168 hours.   Chemistry Recent Labs  Lab 08/06/18 1320 08/07/18 0536 08/08/18 0701  NA 138 136 134*  K 4.2 3.8 4.4  CL 103 99 98  CO2 22 31 27   GLUCOSE  129* 110* 119*  BUN 16 15 15   CREATININE 0.95 1.05 1.05  CALCIUM 8.3* 8.2* 8.8*  PROT 7.1  --   --   ALBUMIN 3.5  --   --   AST 19  --   --   ALT 11  --   --   ALKPHOS 74  --   --   BILITOT 0.3  --   --   GFRNONAA >60 >60 >60  GFRAA >60 >60 >60  ANIONGAP 13 6 9      Hematology Recent Labs  Lab 08/06/18 1320 08/07/18 0536  WBC 8.2 6.0  RBC 4.33 4.11*  HGB 11.2* 10.7*  HCT 36.8* 34.8*  MCV 85.0 84.7  MCH 25.9* 26.0  MCHC 30.4 30.7  RDW 22.0* 22.0*  PLT 251 231    BNP Recent Labs  Lab 08/06/18 1320  BNP 173.0*     DDimer No results for input(s): DDIMER in the last 168 hours.   Radiology    Dg Chest Portable 1 View  Result Date: 08/06/2018 CLINICAL DATA:  Chest pain and shortness of breath today. EXAM: PORTABLE CHEST 1 VIEW COMPARISON:  07/18/2018 FINDINGS: Lordotic technique is demonstrated. Lungs are  adequately inflated and demonstrate mild stable cardiomegaly with minimal vascular congestion. No lobar consolidation, effusion or pneumothorax. Remainder the exam is unchanged. IMPRESSION: Stable cardiomegaly with suggestion mild vascular congestion. Electronically Signed   By: Marin Olp M.D.   On: 08/06/2018 15:12    Cardiac Studies   2D Echo 03/2018: 1. The left ventricle has severely reduced systolic function of 16-10%. The cavity size was mildly increased. There is no increased left ventricular wall thickness. Left ventricular diastology could not be evaluated due to nondiagnostic images. Left ventricular diffuse hypokinesis. 2. Left atrial size was moderately dilated. 3. Not assessed. 4. The mitral valve was not assessed. There is mild thickening and mild calcification. Mitral valve regurgitation was not significant. 5. The tricuspid valve is not assessed. Tricuspid valve regurgitation was not assessed by color flow Doppler. 6. The aortic valve was not assessed. 7. The interatrial septum was not well visualized.  [facility] [place] (2018): Mild luminal irregularities noted.  No significant CAD seen in any coronary artery.  LVEF 45 to 50% with mild global hypokinesis.  Patient Profile     58 y.o. male hx of HFrEF secondary to presumed NICM, persistent Afib on amiodarone and Xarelto, COPD, morbid obesity, and sleep apnea on CPAP admitted with acute on chronic HFrEF and persistent atrial fibrillation.  Assessment & Plan    Acute on chronic HFrEF: Patient still appears volume overloaded but is somewhat better than yesterday.  Continue furosemide 40 mg IV twice daily.  Continue metoprolol succinate 25 mg twice daily and spironolactone 25 mg daily.  Increase lisinopril to 5 mg daily.  Persistent atrial fibrillation: Patient status post successful cardioversion this morning.  Continue amiodarone 200 mg daily.  Continue rivaroxaban 20 mg daily.  Continue metoprolol succinate 25  mg twice daily.  Chest pain and elevated troponin: Chest pain is chronic and unlikely to be due to CAD involving a major epicardial vessels.  Negligible troponin elevation is not consistent with ACS and likely represents supply-demand mismatch.  I have personally reviewed catheterization report from 2018, which showed no angiographically significant CAD.  No plans for ischemia work-up at this time.  Continue management of heart failure and atrial fibrillation, as above.  Abnormal TSH: Mild elevation in TSH noted with normal free T4.  Question sick euthyroid versus subclinical hypothyroidism  or effects of amiodarone.  Further outpatient monitoring recommended.  Patient may benefit from endocrinology referral as an outpatient if TSH remains elevated.  Disposition:  Recommend continuation of inpatient monitoring and IV diuresis today.  Patient feels well with improved edema, we could consider transitioning to oral diuretic tomorrow and working towards discharge.    For questions or updates, please contact [facility] Please consult [email or web address] for contact info under [facility].  Signed, Yvonne Kendallhristopher Dashauna Heymann, MD  08/08/2018, 12:44 PM

## 2018-08-08 NOTE — Anesthesia Preprocedure Evaluation (Addendum)
Anesthesia Evaluation  Patient identified by MRN, date of birth, ID band Patient awake    Reviewed: Allergy & Precautions, H&P , NPO status , Patient's Chart, lab work & pertinent test results  Airway Mallampati: III       Dental  (+) Chipped, Poor Dentition, Missing   Pulmonary asthma , sleep apnea and Continuous Positive Airway Pressure Ventilation , COPD,  COPD inhaler,           Cardiovascular hypertension, +CHF  + dysrhythmias Atrial Fibrillation  Rhythm:irregular Rate:Normal  1. The left ventricle has severely reduced systolic function of 82-95%. The cavity size was mildly increased. There is no increased left ventricular wall thickness. Left ventricular diastology could not be evaluated due to nondiagnostic images. Left  ventricular diffuse hypokinesis.  2. Left atrial size was moderately dilated.  3. Not assessed.  4. The mitral valve was not assessed. There is mild thickening and mild calcification. Mitral valve regurgitation was not significant.  5. The tricuspid valve is not assessed. Tricuspid valve regurgitation was not assessed by color flow Doppler.  6. The aortic valve was not assessed.  7. The interatrial septum was not well visualized.   Neuro/Psych PSYCHIATRIC DISORDERS Depression negative neurological ROS     GI/Hepatic negative GI ROS, Neg liver ROS,   Endo/Other  diabetesMorbid obesity (super morbid obesity BMI 61)  Renal/GU negative Renal ROS  negative genitourinary   Musculoskeletal   Abdominal   Peds  Hematology  (+) Blood dyscrasia, anemia ,   Anesthesia Other Findings Past Medical History: No date: Asthma No date: Brain damage No date: COPD (chronic obstructive pulmonary disease) (HCC) No date: Depression No date: HFrEF (heart failure with reduced ejection fraction) ([place])     Comment:  a. 03/2018 Echo: EF 25-30%, diff HK. Mod dil LA. No date: Persistent atrial fibrillation     Comment:   a. 03/2018 s/p DCCV; b. CHA2DS2VASc = 1-->Xarelto; c.               05/2018 recurrent afib-->Amio initiated;  No date: Sleep apnea  Past Surgical History: 03/24/2018: CARDIOVERSION; N/A     Comment:  Procedure: CARDIOVERSION;  Surgeon: Minna Merritts,               MD;  Location: [facility] ORS;  Service: Cardiovascular;                Laterality: N/A; No date: hearnia repair     Comment:  X 3- total of two surgeries No date: LEG SURGERY  BMI    Body Mass Index: 61.28 kg/m      Reproductive/Obstetrics negative OB ROS                          Anesthesia Physical Anesthesia Plan  ASA: III  Anesthesia Plan: General   Post-op Pain Management:    Induction:   PONV Risk Score and Plan:   Airway Management Planned: Nasal Cannula and Natural Airway  Additional Equipment:   Intra-op Plan:   Post-operative Plan:   Informed Consent: I have reviewed the patients History and Physical, chart, labs and discussed the procedure including the risks, benefits and alternatives for the proposed anesthesia with the patient or authorized representative who has indicated his/her understanding and acceptance.     Dental Advisory Given  Plan Discussed with: Anesthesiologist and CRNA  Anesthesia Plan Comments:        Anesthesia Quick Evaluation

## 2018-08-08 NOTE — Consult Note (Signed)
Pharmacy Antibiotic Note  [name] [name] is a 58 y.o. male admitted on 08/06/2018 with wound infection.  Pharmacy has been consulted for Vancomycin/Cefepime dosing.  Plan: Will Start Cefepime 2g q 8 hours  Patient received Vancomycin loading dose of 2500mg . Started on maintenance dose of 1750 mg q12H. A slight bump in creatinine will reduce dose to 1500 mg q12H for a predicted AUC of 491 and Css min of 14.8. Goal AUC is 400-550. Scr used 1.05.   Height: 5\' 8"  (172.7 cm) Weight: (!) 403 lb (182.8 kg) IBW/kg (Calculated) : 68.4  Temp (24hrs), Avg:98.7 F (37.1 C), Min:98.1 F (36.7 C), Max:99.5 F (37.5 C)  Recent Labs  Lab 08/06/18 1320 08/07/18 0536 08/08/18 0701  WBC 8.2 6.0  --   CREATININE 0.95 1.05 1.05    Estimated Creatinine Clearance: 125.4 mL/min (by C-G formula based on SCr of 1.05 mg/dL).    No Known Allergies  Antimicrobials this admission: Vancomycin 6/21 >> Cefepime 6/21 >>  Dose adjustments this admission: vanco 1750 mg q12H > 1500 mg q12H   Microbiology results: Wound cx 6/21 >> pending COVID NEG  Thank you for allowing pharmacy to be a part of this patient's care.  Oswald Hillock, PharmD, BCPS Clinical Pharmacist 08/08/2018 7:50 AM

## 2018-08-08 NOTE — TOC Initial Note (Signed)
Transition of Care [facility]) - Initial/Assessment Note    Patient Details  Name: [name] [name] MRN: [id number] Date of Birth: [birth year]  Transition of Care [facility]) CM/SW Contact:    Virgel Manifold, RN Phone Number: 08/08/2018, 11:01 AM  Clinical Narrative:  [facility] team consulted to complete high risk readmission assessment. Patient lives at home alone. Recently moved from [place]. Patient reports he is independent at baseline with a walker and still drives. Patient reports difficulty affording his medications, patient is paying $3 co pays for all his medications. Patient tells me he was paying 50 cents per medication back in [place]. Patient tells me he is working with his PCP to cut back on some of his medications as he takes 12 different meds. PCP is [employer].                  Expected Discharge Plan: Home/Self Care Barriers to Discharge: Continued Medical Work up   Patient Goals and CMS Choice        Expected Discharge Plan and Services Expected Discharge Plan: Home/Self Care In-house Referral: Clinical Social Work     Living arrangements for the past 2 months: Apartment                                      Prior Living Arrangements/Services Living arrangements for the past 2 months: Apartment Lives with:: Self Patient language and need for interpreter reviewed:: Yes Do you feel safe going back to the place where you live?: Yes      Need for Family Participation in Patient Care: No (Comment)   Current home services: DME Criminal Activity/Legal Involvement Pertinent to Current Situation/Hospitalization: No - Comment as needed  Activities of Daily Living Home Assistive Devices/Equipment: Environmental consultant (specify type)(wheeled) ADL Screening (condition at time of admission) Patient's cognitive ability adequate to safely complete daily activities?: Yes Is the patient deaf or have difficulty hearing?: No Does the patient have difficulty seeing, even when wearing  glasses/contacts?: No Does the patient have difficulty concentrating, remembering, or making decisions?: No Patient able to express need for assistance with ADLs?: Yes Does the patient have difficulty dressing or bathing?: No Independently performs ADLs?: Yes (appropriate for developmental age) Does the patient have difficulty walking or climbing stairs?: Yes Weakness of Legs: Both Weakness of Arms/Hands: Both  Permission Sought/Granted Permission sought to share information with : Case Manager                Emotional Assessment Appearance:: Appears stated age Attitude/Demeanor/Rapport: Engaged Affect (typically observed): Calm Orientation: : Oriented to Self, Oriented to Place, Oriented to  Time, Oriented to Situation      Admission diagnosis:  COPD exacerbation (HCC) [J44.1] Chest pain, unspecified type [R07.9] Acute on chronic congestive heart failure, unspecified heart failure type [facility]) [I50.9] Patient Active Problem List   Diagnosis Date Noted  . Chest pain 08/06/2018  . Acute on chronic systolic CHF (congestive heart failure) (HCC) 07/18/2018  . Chronic atrial fibrillation   . Personal history of DVT (deep vein thrombosis) 07/13/2018  . History of pulmonary embolism (on Xarelto) 07/13/2018  . Neck pain 07/13/2018  . Thoracic spine pain 07/13/2018  . Chronic bilateral low back pain with bilateral sciatica 07/13/2018  . Chronic pain of both knees 07/13/2018  . TBI (traumatic brain injury) (HCC) 07/04/2018  . Anemia 06/27/2018  . Abnormal thyroid blood test 06/27/2018  . Diet-controlled diabetes mellitus (HCC)  06/06/2018  . HTN (hypertension) 06/06/2018  . Gout 06/06/2018  . Morbid obesity ([place]) 06/06/2018  . Chronic pain 06/06/2018  . Ulcers of both lower extremities, limited to breakdown of skin ([place]) 06/06/2018  . Persistent atrial fibrillation   . Chronic systolic heart failure ([place]) 02/02/2018  . COPD (chronic obstructive pulmonary disease) ([place]) 02/02/2018   . Obstructive sleep apnea 02/02/2018  . Acute respiratory failure with hypoxia (HCC) 01/27/2018   PCP:  [name], DO Pharmacy:   [facility] [address] [place] [address] [place]) [place] [address] Phone: [phone] Fax: [phone]     Social Determinants of Health (SDOH) Interventions    Readmission Risk Interventions Readmission Risk Prevention Plan 08/08/2018 07/23/2018 07/20/2018  Transportation Screening Complete Complete Complete  PCP or Specialist Appt within 3-5 Days Complete - -  HRI or Home Care Consult Complete - -  Social Work Consult for [place] Planning/Counseling Patient refused - -  Palliative Care Screening Not Applicable - -  Medication Review Press photographer) Complete - Complete  PCP or Specialist appointment within 3-5 days of discharge - - Complete  HRI or [place] - Complete Complete  SW Recovery Care/Counseling Consult - Complete Complete  [place] - Not Applicable Not Applicable

## 2018-08-08 NOTE — Plan of Care (Signed)
  Problem: Education: Goal: Ability to demonstrate management of disease process will improve Outcome: Not Progressing  Note: Patient verbalizes understanding regarding fluid restriction but continues to ask for multiple cartons of milk/coffee/icees throughout shift. Educated patient again on fluid restriction and importance of fluid restriction. Patient refuses to watch EMMI heart failure videos at this time.    Problem: Cardiac: Goal: Ability to achieve and maintain adequate cardiopulmonary perfusion will improve Outcome: Progressing  Diuresing well with IV lasix.

## 2018-08-09 ENCOUNTER — Telehealth: Payer: Self-pay

## 2018-08-09 LAB — GLUCOSE, CAPILLARY
Glucose-Capillary: 106 mg/dL — ABNORMAL HIGH (ref 70–99)
Glucose-Capillary: 140 mg/dL — ABNORMAL HIGH (ref 70–99)
Glucose-Capillary: 104 mg/dL — ABNORMAL HIGH (ref 70–99)
Glucose-Capillary: 113 mg/dL — ABNORMAL HIGH (ref 70–99)

## 2018-08-09 LAB — BASIC METABOLIC PANEL
Anion gap: 10 (ref 5–15)
BUN: 19 mg/dL (ref 6–20)
CO2: 28 mmol/L (ref 22–32)
Calcium: 9.1 mg/dL (ref 8.9–10.3)
Chloride: 96 mmol/L — ABNORMAL LOW (ref 98–111)
Creatinine, Ser: 1 mg/dL (ref 0.61–1.24)
GFR calc Af Amer: >60 mL/min (ref >60)
GFR calc non Af Amer: >60 mL/min (ref >60)
Glucose, Bld: 118 mg/dL — ABNORMAL HIGH (ref 70–99)
Potassium: 4.7 mmol/L (ref 3.5–5.1)
Sodium: 134 mmol/L — ABNORMAL LOW (ref 135–145)

## 2018-08-09 MED ORDER — SODIUM CHLORIDE 0.9 % IV SOLN
2.0000 g | INTRAVENOUS | Status: DC
  Administered 2018-08-09: 2 g via INTRAVENOUS
  Filled 2018-08-09 (×2): qty 20

## 2018-08-09 MED ORDER — MUSCLE RUB 10-15 % EX CREA
TOPICAL_CREAM | CUTANEOUS | Status: DC | PRN
  Administered 2018-08-09: 22:00:00 via TOPICAL
  Filled 2018-08-09: qty 85

## 2018-08-09 MED ORDER — FUROSEMIDE 10 MG/ML IJ SOLN
60.0000 mg | Freq: Two times a day (BID) | INTRAMUSCULAR | Status: DC
  Administered 2018-08-09 – 2018-08-10 (×2): 60 mg via INTRAVENOUS
  Filled 2018-08-09 (×2): qty 6

## 2018-08-09 NOTE — Anesthesia Postprocedure Evaluation (Signed)
Anesthesia Post Note  Patient: [name] [name]  Procedure(s) Performed: CARDIOVERSION (N/A )  Patient location during evaluation: PACU Anesthesia Type: General Level of consciousness: awake and alert Pain management: pain level controlled Vital Signs Assessment: post-procedure vital signs reviewed and stable Respiratory status: spontaneous breathing, nonlabored ventilation and respiratory function stable Cardiovascular status: blood pressure returned to baseline and stable Postop Assessment: no apparent nausea or vomiting Anesthetic complications: no     Last Vitals:  Vitals:   08/09/18 0449 08/09/18 0738  BP: (!) 90/35 118/68  Pulse: 65 70  Resp:  19  Temp:  36.9 C  SpO2: 93% 100%    Last Pain:  Vitals:   08/09/18 0738  TempSrc: Oral  PainSc:                  Durenda Hurt

## 2018-08-09 NOTE — Consult Note (Signed)
Pharmacy Antibiotic Note  [name] [name] is a 58 y.o. male admitted on 08/06/2018 with wound infection.  Pharmacy has been consulted for Vancomycin dosing.  Plan: Patient received Vancomycin loading dose of 2500mg . Started on maintenance dose of 1750 mg q12H. A slight bump in creatinine will reduce dose to 1500 mg q12H for a predicted AUC of 491 and Css min of 14.8. Goal AUC is 400-550. Scr used 1.05. Scr trending down. Will order peak 6/25 0130 and trough 6/25 0930.   Height: 5\' 8"  (172.7 cm) Weight: (!) 403 lb (182.8 kg) IBW/kg (Calculated) : 68.4  Temp (24hrs), Avg:98.1 F (36.7 C), Min:97.4 F (36.3 C), Max:98.5 F (36.9 C)  Recent Labs  Lab 08/06/18 1320 08/07/18 0536 08/08/18 0701 08/09/18 0716  WBC 8.2 6.0  --   --   CREATININE 0.95 1.05 1.05 1.00    Estimated Creatinine Clearance: 131.6 mL/min (by C-G formula based on SCr of 1 mg/dL).    No Known Allergies  Antimicrobials this admission: Vancomycin 6/21 >> Cefepime 6/21 >>6/23  Dose adjustments this admission: vanco 1750 mg q12H > 1500 mg q12H   Microbiology results: Wound cx 6/21 >> pending COVID NEG  Thank you for allowing pharmacy to be a part of this patient's care.  Oswald Hillock, PharmD, BCPS Clinical Pharmacist 08/09/2018 10:08 AM

## 2018-08-09 NOTE — Progress Notes (Signed)
[employer] - [place] [facility]   PATIENT NAME: [name] [name]    MR#:  [id number]  DATE OF BIRTH:  September 30, 1960  SUBJECTIVE: Complains of back pain, shortness of breath.  No chest pain.  CHIEF COMPLAINT:   Chief Complaint  Patient presents with  . Chest Pain  . Shortness of Breath   -Feels much better today.  Status post cardioversion for his A. fib successfully today. -Continue IV diuresis.  Breathing and leg swelling are improved.  REVIEW OF SYSTEMS:  Review of Systems  Constitutional: Positive for malaise/fatigue. Negative for chills and fever.  HENT: Negative for congestion, ear discharge, hearing loss and nosebleeds.   Respiratory: Positive for shortness of breath. Negative for cough and wheezing.   Cardiovascular: Positive for chest pain and leg swelling. Negative for palpitations.  Gastrointestinal: Negative for abdominal pain, constipation, diarrhea, nausea and vomiting.  Genitourinary: Negative for dysuria.  Musculoskeletal: Negative for myalgias.  Neurological: Negative for dizziness, focal weakness, seizures, weakness and headaches.  Psychiatric/Behavioral: Negative for depression.    DRUG ALLERGIES:  No Known Allergies  VITALS:  Blood pressure 118/68, pulse 70, temperature 98.5 F (36.9 C), temperature source Oral, resp. rate 19, height 5\' 8"  (1.727 m), weight (!) 182.8 kg, SpO2 100 %.  PHYSICAL EXAMINATION:  Physical Exam   GENERAL:  58 y.o.-year-old obese patient lying in the bed with no acute distress.  EYES: Pupils equal, round, reactive to light and accommodation. No scleral icterus. Extraocular muscles intact.  HEENT: Head atraumatic, normocephalic. Oropharynx and nasopharynx clear.  NECK:  Supple, no jugular venous distention. No thyroid enlargement, no tenderness.  LUNGS: Scant breath sounds bilaterally, no wheezing, rales,rhonchi or crepitation. No use of accessory muscles of respiration.  Decreased at the bases CARDIOVASCULAR:  S1, S2 normal. No murmurs, rubs, or gallops.  ABDOMEN: Soft, nontender, nondistended. Bowel sounds present. No organomegaly or mass.  EXTREMITIES: No  cyanosis, or clubbing.  Significant bilateral lower extremity edema and erythema in the lower one third of the leg with some open wounds- unna boots present today NEUROLOGIC: Cranial nerves II through XII are intact. Muscle strength 5/5 in all extremities. Sensation intact. Gait not checked.  PSYCHIATRIC: The patient is alert and oriented x 3.  SKIN: No obvious rash, lesion, or ulcer.    LABORATORY PANEL:   CBC Recent Labs  Lab 08/07/18 0536  WBC 6.0  HGB 10.7*  HCT 34.8*  PLT 231   ------------------------------------------------------------------------------------------------------------------  Chemistries  Recent Labs  Lab 08/06/18 1320  08/09/18 0716  NA 138   < > 134*  K 4.2   < > 4.7  CL 103   < > 96*  CO2 22   < > 28  GLUCOSE 129*   < > 118*  BUN 16   < > 19  CREATININE 0.95   < > 1.00  CALCIUM 8.3*   < > 9.1  AST 19  --   --   ALT 11  --   --   ALKPHOS 74  --   --   BILITOT 0.3  --   --    < > = values in this interval not displayed.   ------------------------------------------------------------------------------------------------------------------  Cardiac Enzymes Recent Labs  Lab 08/07/18 0021  TROPONINI 0.04*   ------------------------------------------------------------------------------------------------------------------  RADIOLOGY:  No results found.  EKG:   Orders placed or performed during the hospital encounter of 08/06/18  . ED EKG  . ED EKG  . EKG 12-Lead  . EKG 12-Lead  . EKG  12-Lead  . 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  1.  Acute on chronic diastolic heart failure-known history of presumed nonischemic cardiomyopathy.,  Continue IV Lasix, transition to p.o. diuretics tomorrow, possible discharge tomorrow.  Continue beta-blockers, ACE inhibitors, ARB's, possibly can start Entresto as an outpatient.  -2.  Atrial fibrillation with rapid ventricular response-status post DC cardioversion yesterday, -Continue amiodarone.  Also on Xarelto at home. -Also on Toprol  3.  Lower extremity edema and wounds-secondary to skin bacteria, large of skin blisters that opened up due to worsening edema. -Secondary to his lymphedema.  Currently on vancomycin. -Appreciate wound consult and Unna boots placed.  Discharge on doxycycline. -Recommend outpatient follow-up with wound care clinic  4.  Hypertension-continue Toprol, lisinopril and Aldactone  5.  DVT prophylaxis-patient on Xarelto       All the records are reviewed and case discussed with Care Management/Social Workerr. Management plans discussed with the patient, family and they are in agreement.  CODE STATUS: Full code  TOTAL TIME TAKING CARE OF THIS PATIENT: 38 minutes.   POSSIBLE D/C IN 2 DAYS, DEPENDING ON CLINICAL CONDITION.   Epifanio Lesches M.D on 08/09/2018 at 1:07 PM  Between 7am to 6pm - Pager - [phone]  After 6pm go to [email or web address] - password [email or web address] [facility]  Office  [phone]  CC: Primary care physician; [name], DO

## 2018-08-09 NOTE — Progress Notes (Signed)
Progress Note  Patient Name: [name] [name] Date of Encounter: 08/09/2018  Primary Cardiologist: End  Subjective   Stable chest pain that has been present for at least 10 years with some irritation following his DCCV on 6/23. SOB stable. Feels like his right leg is more swollen. Maintaining sinus rhythm. Renal function stable at 1.00 with a potassium of 4.7. Documented UOP of 2.1 L for the past 24 hours with a net - 6.8 L for the admission. No recent weights.   Inpatient Medications    Scheduled Meds: . allopurinol  100 mg Oral Daily  . amiodarone  200 mg Oral Daily  . atorvastatin  40 mg Oral q1800  . ferrous sulfate  325 mg Oral BID WC  . fluticasone  2 spray Each Nare BID  . furosemide  40 mg Intravenous BID  . gabapentin  300 mg Oral TID  . insulin aspart  0-5 Units Subcutaneous QHS  . insulin aspart  0-9 Units Subcutaneous TID WC  . ketotifen  1 drop Both Eyes BID  . levothyroxine  25 mcg Oral QAC breakfast  . lisinopril  5 mg Oral Daily  . loratadine  10 mg Oral Daily  . metoprolol succinate  25 mg Oral BID  . mometasone-formoterol  2 puff Inhalation BID  . potassium chloride SA  20 mEq Oral BID  . rivaroxaban  20 mg Oral Daily  . sodium chloride flush  3 mL Intravenous Q12H  . spironolactone  25 mg Oral Daily  . thiamine  100 mg Oral Daily   Continuous Infusions: . sodium chloride 250 mL (08/08/18 0635)  . vancomycin     PRN Meds: sodium chloride, acetaminophen **OR** acetaminophen, albuterol, morphine injection, nitroGLYCERIN, ondansetron **OR** ondansetron (ZOFRAN) IV, oxyCODONE, traZODone   Vital Signs    Vitals:   08/08/18 1956 08/09/18 0446 08/09/18 0449 08/09/18 0738  BP: 121/70 (!) 92/40 (!) 90/35 118/68  Pulse: 75 66 65 70  Resp: 18 19  19   Temp: (!) 97.4 F (36.3 C) 98.3 F (36.8 C)  98.5 F (36.9 C)  TempSrc: Oral Oral  Oral  SpO2: 100% 93% 93% 100%  Weight:      Height:        Intake/Output Summary (Last 24 hours) at 08/09/2018 0940  Last data filed at 08/09/2018 0713 Gross per 24 hour  Intake 1220 ml  Output 3750 ml  Net -2530 ml   Filed Weights   08/06/18 1316 08/07/18 0151  Weight: (!) 181.4 kg (!) 182.8 kg    Telemetry    SR, BBB - Personally Reviewed  ECG    NSR, 75 bpm, LBBB - Personally Reviewed  Physical Exam   GEN: No acute distress. Sitting is recliner with feet hanging down.  Neck: JVD difficult to assess secondary to body habitus. Cardiac: RRR, no murmurs, rubs, or gallops.  Respiratory: Diminished bilaterally without crackles or wheezes.  GI: Obese, soft, nontender, non-distended.   MS: Bilateral lower extremities wrapped with 1+ bilateral edema; No deformity. Neuro:  Alert and oriented x 3; Nonfocal.  Psych: Normal affect.  Labs    Chemistry Recent Labs  Lab 08/06/18 1320 08/07/18 0536 08/08/18 0701 08/09/18 0716  NA 138 136 134* 134*  K 4.2 3.8 4.4 4.7  CL 103 99 98 96*  CO2 22 31 27 28   GLUCOSE 129* 110* 119* 118*  BUN 16 15 15 19   CREATININE 0.95 1.05 1.05 1.00  CALCIUM 8.3* 8.2* 8.8* 9.1  PROT 7.1  --   --   --  ALBUMIN 3.5  --   --   --   AST 19  --   --   --   ALT 11  --   --   --   ALKPHOS 74  --   --   --   BILITOT 0.3  --   --   --   GFRNONAA >60 >60 >60 >60  GFRAA >60 >60 >60 >60  ANIONGAP 13 6 9 10      Hematology Recent Labs  Lab 08/06/18 1320 08/07/18 0536  WBC 8.2 6.0  RBC 4.33 4.11*  HGB 11.2* 10.7*  HCT 36.8* 34.8*  MCV 85.0 84.7  MCH 25.9* 26.0  MCHC 30.4 30.7  RDW 22.0* 22.0*  PLT 251 231    Cardiac Enzymes Recent Labs  Lab 08/06/18 1320 08/06/18 1645 08/07/18 0021  TROPONINI <0.03 0.04* 0.04*   No results for input(s): TROPIPOC in the last 168 hours.   BNP Recent Labs  Lab 08/06/18 1320  BNP 173.0*     DDimer No results for input(s): DDIMER in the last 168 hours.   Radiology    No results found.  Cardiac Studies   2D Echo 03/2018: 1. The left ventricle has severely reduced systolic function of 25-30%. The cavity size  was mildly increased. There is no increased left ventricular wall thickness. Left ventricular diastology could not be evaluated due to nondiagnostic images. Left ventricular diffuse hypokinesis.  2. Left atrial size was moderately dilated.  3. Not assessed.  4. The mitral valve was not assessed. There is mild thickening and mild calcification. Mitral valve regurgitation was not significant.  5. The tricuspid valve is not assessed. Tricuspid valve regurgitation was not assessed by color flow Doppler.  6. The aortic valve was not assessed.  7. The interatrial septum was not well visualized.  Patient Profile     58 y.o. male with history of HFrEF secondary to presumed NICM, persistent Afib on amiodarone and Xarelto, COPD, morbid obesity, and sleep apnea on CPAP who is being seen today for the evaluation of acute on chronic HFrEF and persistent Afib at the request of Dr. Renae Gloss.  Assessment & Plan    1. Acute on chronic HFrEF secondary to presumed NICM: -His volume status is somewhat difficult to assess given his body habitus  -He does continue to note lower extremity swelling, which he states is worse on the right leg (not acutely) -He prefers to remain admitted one more night -Escalate IV Lasix to 60 mg bid with likely transition to PO diuretic on 6/25 -Continue Toprol, lisinopril, and spironolactone  -In outpatient follow up, consider transition to [facility]  2. Elevated troponin: -Troponin minimally elevated and flat trending, felt to be in the setting of supply demand ischemia with volume overload, Afib with RVR, and anemia -He has chronic chest pain that dates back ~ 10 years -No plans for inpatient ischemic evaluation -Cath from 2018 showed no angiographically significant CAD -On Xarelto in place of ASA -Aggressive risk factor modification   3. Persistent Afib with RVR: -Maintaining sinus rhythm following DCCV on 08/08/2018 -Mild skin irritation from cardioversion, recommend  hydrocortisone cream -Continue Toprol XL and amiodarone  -Xarelto   4. Abnormal TSH: -Free T4 normal -Outpatient follow up  5. Anemia: -No CBC today  6. Morbid obesity: -Weight loss advised  7. Chronic pain: -He requests a referral to pain management, perhaps IM can assist with this  For questions or updates, please contact [facility] Please consult [email or web address] for contact info under  Cardiology/STEMI.    Signed, Eula Listenyan Othel Dicostanzo, PA-C [facility] Pager: [phone] 08/09/2018, 9:40 AM

## 2018-08-10 ENCOUNTER — Telehealth: Payer: Self-pay | Admitting: Internal Medicine

## 2018-08-10 DIAGNOSIS — I5043 Acute on chronic combined systolic (congestive) and diastolic (congestive) heart failure: Secondary | ICD-10-CM

## 2018-08-10 LAB — VANCOMYCIN, TROUGH: Vancomycin Tr: 19 ug/mL (ref 15–20)

## 2018-08-10 LAB — GLUCOSE, CAPILLARY
Glucose-Capillary: 116 mg/dL — ABNORMAL HIGH (ref 70–99)
Glucose-Capillary: 112 mg/dL — ABNORMAL HIGH (ref 70–99)

## 2018-08-10 LAB — VANCOMYCIN, PEAK: Vancomycin Pk: 30 ug/mL (ref 30–40)

## 2018-08-10 MED ORDER — AMIODARONE HCL 200 MG PO TABS: 200.0000 mg | ORAL_TABLET | Freq: Every day | ORAL | 0 refills | Status: DC

## 2018-08-10 MED ORDER — SULFAMETHOXAZOLE-TRIMETHOPRIM 800-160 MG PO TABS: 1.0000 | ORAL_TABLET | Freq: Two times a day (BID) | ORAL | 0 refills | Status: DC

## 2018-08-10 MED ORDER — LISINOPRIL 5 MG PO TABS: 5.0000 mg | ORAL_TABLET | Freq: Every day | ORAL | 0 refills | Status: DC

## 2018-08-10 MED ORDER — TORSEMIDE 20 MG PO TABS: 60.0000 mg | ORAL_TABLET | Freq: Every day | ORAL | Status: DC

## 2018-08-10 MED ORDER — OXYCODONE HCL 5 MG PO TABS: 5.0000 mg | ORAL_TABLET | Freq: Four times a day (QID) | ORAL | 0 refills | Status: AC | PRN

## 2018-08-10 MED ORDER — SULFAMETHOXAZOLE-TRIMETHOPRIM 800-160 MG PO TABS
1.0000 | ORAL_TABLET | Freq: Two times a day (BID) | ORAL | Status: DC
  Administered 2018-08-10: 1 via ORAL
  Filled 2018-08-10 (×2): qty 1

## 2018-08-10 MED ORDER — TORSEMIDE 20 MG PO TABS: 60.0000 mg | ORAL_TABLET | Freq: Every day | ORAL | 0 refills | Status: DC

## 2018-08-10 NOTE — Progress Notes (Signed)
Patient refusing bed alarm and chair alarm.  Patient alert and oriented. Patient educated on importance of bed alarm for safety. Patient verbalizes understanding of bed alarm.  States he only gets up to the chair and will not walk around in room.

## 2018-08-10 NOTE — Progress Notes (Signed)
Progress Note  Patient Name: [name] [name] Date of Encounter: 08/10/2018  Primary Cardiologist: End  Subjective   Lasix was escalated to 60 mg twice daily the afternoon of 6/24.  Labs pending this morning. Documented UOP 2.5 L for the past 24 hours with a -10 L for the admission.  Documented weight up 5 kg with admission.  Uncertain accuracy.  Right posterior shoulder is sore after sleeping on his telemetry box overnight.  Inpatient Medications    Scheduled Meds: . allopurinol  100 mg Oral Daily  . amiodarone  200 mg Oral Daily  . atorvastatin  40 mg Oral q1800  . ferrous sulfate  325 mg Oral BID WC  . fluticasone  2 spray Each Nare BID  . furosemide  60 mg Intravenous BID  . gabapentin  300 mg Oral TID  . insulin aspart  0-5 Units Subcutaneous QHS  . insulin aspart  0-9 Units Subcutaneous TID WC  . ketotifen  1 drop Both Eyes BID  . levothyroxine  25 mcg Oral QAC breakfast  . lisinopril  5 mg Oral Daily  . loratadine  10 mg Oral Daily  . metoprolol succinate  25 mg Oral BID  . mometasone-formoterol  2 puff Inhalation BID  . potassium chloride SA  20 mEq Oral BID  . rivaroxaban  20 mg Oral Daily  . sodium chloride flush  3 mL Intravenous Q12H  . spironolactone  25 mg Oral Daily  . sulfamethoxazole-trimethoprim  1 tablet Oral Q12H  . thiamine  100 mg Oral Daily   Continuous Infusions: . sodium chloride 500 mL (08/09/18 1145)   PRN Meds: sodium chloride, acetaminophen **OR** acetaminophen, albuterol, morphine injection, Muscle Rub, nitroGLYCERIN, ondansetron **OR** ondansetron (ZOFRAN) IV, oxyCODONE, traZODone   Vital Signs    Vitals:   08/09/18 2022 08/09/18 2139 08/10/18 0444 08/10/18 0715  BP: (!) 93/50 (!) 117/58 (!) 71/17 (!) 122/49  Pulse: 73 61 66 72  Resp: 18  18 18   Temp: 98.9 F (37.2 C)  98.4 F (36.9 C) 98.1 F (36.7 C)  TempSrc: Oral  Oral Oral  SpO2: 100%  94% 100%  Weight:   (!) 187.2 kg   Height:        Intake/Output Summary (Last 24  hours) at 08/10/2018 1021 Last data filed at 08/10/2018 1013 Gross per 24 hour  Intake 1390.04 ml  Output 4850 ml  Net -3459.96 ml   Filed Weights   08/06/18 1316 08/07/18 0151 08/10/18 0444  Weight: (!) 181.4 kg (!) 182.8 kg (!) 187.2 kg    Telemetry    SR with 1st degree AV block - Personally Reviewed  ECG    n/a - Personally Reviewed  Physical Exam   GEN: No acute distress.   Neck: JVD unable to be assessed secondary to body habitus. Cardiac:  Distant heart sounds, RRR, no murmurs, rubs, or gallops.  Respiratory:  Mildly diminished throughout though improved.  GI: Soft, nontender, non-distended.   MS:  Stable 1+ bilateral pitting edema with both calf being wrapped with [place]; No deformity. Neuro:  Alert and oriented x 3; Nonfocal.  Psych: Normal affect.  Labs    Chemistry Recent Labs  Lab 08/06/18 1320 08/07/18 0536 08/08/18 0701 08/09/18 0716  NA 138 136 134* 134*  K 4.2 3.8 4.4 4.7  CL 103 99 98 96*  CO2 22 31 27 28   GLUCOSE 129* 110* 119* 118*  BUN 16 15 15 19   CREATININE 0.95 1.05 1.05 1.00  CALCIUM 8.3*  8.2* 8.8* 9.1  PROT 7.1  --   --   --   ALBUMIN 3.5  --   --   --   AST 19  --   --   --   ALT 11  --   --   --   ALKPHOS 74  --   --   --   BILITOT 0.3  --   --   --   GFRNONAA >60 >60 >60 >60  GFRAA >60 >60 >60 >60  ANIONGAP 13 6 9 10      Hematology Recent Labs  Lab 08/06/18 1320 08/07/18 0536  WBC 8.2 6.0  RBC 4.33 4.11*  HGB 11.2* 10.7*  HCT 36.8* 34.8*  MCV 85.0 84.7  MCH 25.9* 26.0  MCHC 30.4 30.7  RDW 22.0* 22.0*  PLT 251 231    Cardiac Enzymes Recent Labs  Lab 08/06/18 1320 08/06/18 1645 08/07/18 0021  TROPONINI <0.03 0.04* 0.04*   No results for input(s): TROPIPOC in the last 168 hours.   BNP Recent Labs  Lab 08/06/18 1320  BNP 173.0*     DDimer No results for input(s): DDIMER in the last 168 hours.   Radiology    No results found.  Cardiac Studies   2D Echo 03/2018: 1. The left ventricle has severely  reduced systolic function of 25-30%. The cavity size was mildly increased. There is no increased left ventricular wall thickness. Left ventricular diastology could not be evaluated due to nondiagnostic images. Left ventricular diffuse hypokinesis. 2. Left atrial size was moderately dilated. 3. Not assessed. 4. The mitral valve was not assessed. There is mild thickening and mild calcification. Mitral valve regurgitation was not significant. 5. The tricuspid valve is not assessed. Tricuspid valve regurgitation was not assessed by color flow Doppler. 6. The aortic valve was not assessed. 7. The interatrial septum was not well visualized.  Patient Profile     58 y.o. male with history of HFrEF secondary to presumed NICM, persistent Afib on amiodarone and Xarelto, COPD, morbid obesity, and sleep apnea on CPAPwho is being seen today for the evaluation of acute on chronic HFrEF and persistent Afibat the request of Dr. Renae GlossWieting.  Assessment & Plan    1. Acute on chronic HFrEF secondary to presumed NICM: -His volume status is somewhat difficult to assess given his body habitus  -He does continue to note lower extremity swelling, which he states is worse on the right leg (not acutely) -Await renal function this morning following slight escalation of diuresis on 6/24 -If renal function is stable we will likely transition to torsemide 60 mg daily (prior to admission he was on torsemide 40 mg daily) -Continue Toprol, lisinopril, and spironolactone  -In outpatient follow up, [facility] if able to [facility]  2. Elevated troponin: -Troponin minimally elevated and flat trending, felt to be in the setting of supply demand ischemia with volume overload, Afib with RVR, and anemia -He has chronic chest pain that dates back ~ 10 years -No plans for inpatient ischemic evaluation -Cath from 2018 showed no angiographically significant CAD -On Xarelto in place of ASA -Aggressive risk  factor modification   3. Persistent Afib with RVR: -Maintaining sinus rhythm following DCCV on 08/08/2018 -Mild skin irritation from cardioversion, recommend hydrocortisone cream -Continue Toprol XL and amiodarone  -Xarelto   4. Abnormal TSH: -Free T4 normal -Outpatient follow up  5. Anemia: -CBC pending today  6. Morbid obesity: -Weight loss advised  7. Chronic pain: -He requests a referral  to pain management, perhaps IM can assist with this  For questions or updates, please contact [facility] Please consult [email or web address] for contact info under Cardiology/STEMI.    Signed, [name], PA-C [facility] Pager: [phone] 08/10/2018, 10:21 AM

## 2018-08-10 NOTE — Telephone Encounter (Signed)
error 

## 2018-08-10 NOTE — Progress Notes (Signed)
Patient being discharged to home.  Patient has wound care RN out to his home for dressing change on BLLE.  Patient due to have dressing changes done today.  Refusing to let this RN do the dressing changes because he wants to go home.  He states that [facility] can come do them tomorrow.

## 2018-08-10 NOTE — TOC Transition Note (Signed)
Transition of Care [facility]) - CM/SW Discharge Note   Patient Details  Name: [name] [name] MRN: [id number] Date of Birth: 02/07/1961  [facility] of Care [facility]) CM/SW Contact:  Elza Rafter, RN Phone Number: 08/10/2018, 2:58 PM   Clinical Narrative:   Patient discharging to home with [facility] at home with resumption of home health services.  Helene Kelp with [facility] is aware of discharge.      Final next level of care: [place] Barriers to Discharge: No Barriers Identified   Patient Goals and CMS Choice        Discharge Placement                       Discharge Plan and Services In-house Referral: Clinical Social Work                                   Social Determinants of Health (SDOH) Interventions     Readmission Risk Interventions Readmission Risk Prevention Plan 08/08/2018 07/23/2018 07/20/2018  Transportation Screening Complete Complete Complete  PCP or Specialist Appt within 3-5 Days Complete - -  HRI or Home Care Consult Complete - -  Social Work Consult for [place] Planning/Counseling Patient refused - -  Palliative Care Screening Not Applicable - -  Medication Review Press photographer) Complete - Complete  PCP or Specialist appointment within 3-5 days of discharge - - Complete  HRI or [place] - Complete Complete  SW Recovery Care/Counseling Consult - Complete Complete  [place] - Not Applicable Not Applicable

## 2018-08-10 NOTE — Progress Notes (Signed)
Tele and IV d/c'd prior to discharge. Verbalizes understanding of discharge.

## 2018-08-10 NOTE — Progress Notes (Signed)
[employer] - [place] at [facility]   PATIENT NAME: [name] [name]    MR#:  [id number]  DATE OF BIRTH:  [birth year]  SUBJECTIVE: Complains of right shoulder pain , thinks that his telemetry box in his pocket pressed on his right shoulder while sleeping.  No shortness of breath or chest pain  CHIEF COMPLAINT:   Chief Complaint  Patient presents with  . Chest Pain  . Shortness of Breath   -  REVIEW OF SYSTEMS:  Review of Systems  Constitutional: Positive for malaise/fatigue. Negative for chills and fever.  HENT: Negative for congestion, ear discharge, hearing loss and nosebleeds.   Respiratory: Negative for cough, shortness of breath and wheezing.   Cardiovascular: Positive for leg swelling. Negative for chest pain and palpitations.  Gastrointestinal: Negative for abdominal pain, constipation, diarrhea, nausea and vomiting.  Genitourinary: Negative for dysuria.  Musculoskeletal: Positive for back pain and joint pain. Negative for myalgias.  Neurological: Negative for dizziness, focal weakness, seizures, weakness and headaches.  Psychiatric/Behavioral: Negative for depression.    DRUG ALLERGIES:  No Known Allergies  VITALS:  Blood pressure (!) 122/49, pulse 72, temperature 98.1 F (36.7 C), temperature source Oral, resp. rate 18, height 5\' 8"  (1.727 m), weight (!) 187.2 kg, SpO2 100 %.  PHYSICAL EXAMINATION:  Physical Exam   GENERAL:  58 y.o.-year-old obese patient lying in the bed with no acute distress.  EYES: Pupils equal, round, reactive to light and accommodation. No scleral icterus. Extraocular muscles intact.  HEENT: Head atraumatic, normocephalic. Oropharynx and nasopharynx clear.  NECK:  Supple, no jugular venous distention. No thyroid enlargement, no tenderness.  LUNGS: Scant breath sounds bilaterally, no wheezing, rales,rhonchi or crepitation. No use of accessory muscles of respiration.  Decreased at the bases CARDIOVASCULAR: S1, S2 normal. No  murmurs, rubs, or gallops.  ABDOMEN: Soft, nontender, nondistended. Bowel sounds present. No organomegaly or mass.  EXTREMITIES: No  cyanosis, or clubbing.  Significant bilateral lower extremity edema and erythema in the lower one third of the leg with some open wounds- unna boots present today NEUROLOGIC: Cranial nerves II through XII are intact. Muscle strength 5/5 in all extremities. Sensation intact. Gait not checked.  PSYCHIATRIC: The patient is alert and oriented x 3.  SKIN: No obvious rash, lesion, or ulcer.    LABORATORY PANEL:   CBC Recent Labs  Lab 08/07/18 0536  WBC 6.0  HGB 10.7*  HCT 34.8*  PLT 231   ------------------------------------------------------------------------------------------------------------------  Chemistries  Recent Labs  Lab 08/06/18 1320  08/09/18 0716  NA 138   < > 134*  K 4.2   < > 4.7  CL 103   < > 96*  CO2 22   < > 28  GLUCOSE 129*   < > 118*  BUN 16   < > 19  CREATININE 0.95   < > 1.00  CALCIUM 8.3*   < > 9.1  AST 19  --   --   ALT 11  --   --   ALKPHOS 74  --   --   BILITOT 0.3  --   --    < > = values in this interval not displayed.   ------------------------------------------------------------------------------------------------------------------  Cardiac Enzymes Recent Labs  Lab 08/07/18 0021  TROPONINI 0.04*   ------------------------------------------------------------------------------------------------------------------  RADIOLOGY:  No results found.  EKG:   Orders placed or performed during the hospital encounter of 08/06/18  . ED EKG  . ED EKG  . EKG 12-Lead  . EKG 12-Lead  .  EKG 12-Lead  . EKG 12-Lead    ASSESSMENT AND PLAN:   58 year old male with past medical history significant for congestive heart failure secondary to nonischemic cardiomyopathy, persistent atrial fibrillation on Xarelto, COPD, sleep apnea on CPAP, morbid obesity presents to hospital secondary to chest pain and worsening shortness of  breath and lower extremity wounds.  1.  Acute on chronic diastolic heart failure-known history of presumed nonischemic cardiomyopathy.,  Patient is getting IV Lasix, increase the dose yesterday by cardiology, monitor renal function,, hopefully will discharge him tomorrow with high-dose torsemide.   -2.  Atrial fibrillation with rapid ventricular response-status post DC cardioversion y this admission, rate controlled, maintaining sinus rhythm. -Continue amiodarone.  Also on Xarelto at home. -Also on Toprol  3.   chronic venous stasis dermatitis, continue Unna boots, will change antibiotics to Bactrim for only 2 more days to finish the course.  Discussed with pharmacist. 4.  Hypertension-continue Toprol, lisinopril and Aldactone  5.  DVT prophylaxis-patient on Xarelto  #6 /chronic pain issues with low back pain, shoulder pain, patient needs a PCP to refer him to chronic pain management will see if we can arrange outpatient referral for pain management.     All the records are reviewed and case discussed with Care Management/Social Workerr. Management plans discussed with the patient, family and they are in agreement.  CODE STATUS: Full code  TOTAL TIME TAKING CARE OF THIS PATIENT: 38 minutes.   POSSIBLE D/C IN 2 DAYS, DEPENDING ON CLINICAL CONDITION. More than 50% time spent in counseling, coordination of care, patient will go home tomorrow.  Epifanio Lesches M.D on 08/10/2018 at 12:13 PM  Between 7am to 6pm - Pager - [phone]  After 6pm go to [email or web address] - password [email or web address] [place] [facility]  Office  [phone]  CC: Primary care physician; [name], DO

## 2018-08-11 ENCOUNTER — Ambulatory Visit: Payer: Medicaid Other | Admitting: Nurse Practitioner

## 2018-08-11 NOTE — Discharge Summary (Signed)
[name] [name], is a 58 y.o. male  DOB 01-07-1961  MRN [id number].  Admission date:  08/06/2018  Admitting Physician  Loletha Grayer, MD  Discharge Date:  08/10/2018   Primary MD  [name], DO  Recommendations for primary care physician for things to follow:   Follow with PCP in 1 week Follow-up  with [facility] health cardiology Dr.  Saunders Revel in 1 to 2 weeks.   Admission Diagnosis  COPD exacerbation (HCC) [J44.1] Chest pain, unspecified type [R07.9] Acute on chronic congestive heart failure, unspecified heart failure type ([name] [name]) [I50.9]   Discharge Diagnosis  COPD exacerbation ([name]) [J44.1] Chest pain, unspecified type [R07.9] Acute on chronic congestive heart failure, unspecified heart failure type ([name] [name]) [I50.9]    Active Problems:   Chest pain      Past Medical History:  Diagnosis Date  . Asthma   . Brain damage   . COPD (chronic obstructive pulmonary disease) ([place])   . Depression   . HFrEF (heart failure with reduced ejection fraction) ([place])    a. 03/2018 Echo: EF 25-30%, diff HK. Mod dil LA.  [name] [name] Persistent atrial fibrillation    a. 03/2018 s/[name] DCCV; b. CHA2DS2VASc = 1-->Xarelto; c. 05/2018 recurrent afib-->Amio initiated;   . Sleep apnea     Past Surgical History:  Procedure Laterality Date  . CARDIOVERSION N/A 03/24/2018   Procedure: CARDIOVERSION;  Surgeon: Minna Merritts, MD;  Location: [facility] ORS;  Service: Cardiovascular;  Laterality: N/A;  . CARDIOVERSION N/A 08/08/2018   Procedure: CARDIOVERSION;  Surgeon: Nelva Bush, MD;  Location: [facility] ORS;  Service: Cardiovascular;  Laterality: N/A;  . hearnia repair     X 3- total of two surgeries  . LEG SURGERY         History of present illness and  Hospital Course:     Kindly see H&[name] for history of present illness and admission details, please review  complete Labs, Consult reports and Test reports for all details in brief  HPI  from the history and physical done on the day of admission 58 year old morbidly obese male with history of diastolic heart failure due to nonischemic cardiomyopathy, persistent atrial fibrillation on Xarelto, COPD admitted because of worsening shortness of breath, leg swelling.  Admitted for acute on chronic diastolic heart failure.   Hospital Course   #1 acute on chronic heart failure with reduced ejection fraction secondary to nonischemic cardiomyopathy, patient received IV Lasix, seen by [facility] health cardiology, transition to high-dose torsemide 60 mg daily, discharged home.  Regarding heart failure patient is on Toprol, lisinopril, spironolactone.  Outpatient follow-up with [facility] to see if patient can be started on Entresto. 2.  Elevated troponins, patient has been evaluated troponins and flat trending secondary to supply demand ischemia with volume overload, A. fib with RVR, anemia, patient has chronic chest pain dating back 10 years, patient is seen by [facility], received Xarelto. 3.  Atrial fibrillation with RVR, patient had cardioversion on June 23, and maintained sinus rhythm after the cardioversion, discharged home with Toprol-XL, amiodarone, Xarelto. 4.  Morbid obesity: Weight loss advised. Chronic venous stasis.  Patient seen by vascular, received Unna boots.  Patient received IV antibiotics with vancomycin during the hospital stay, discharged home with Bactrim patient required only 2 days of Bactrim finished the course, #5 chronic back pain, multiple pain issues, patient keeps complaining he had multiple back operations, always says that his neck pain, back pain, gave limited supply of short-acting oxycodone.  Referred him to Dr. Dossie Arbour  for pain management.   Discharge him with home health. Discharge Condition: Stable   Follow UP  Follow-up Information    [name], MD. Go on 08/14/2018.    Specialty: Pain Medicine Why: appointment at 1:30pm Contact information: [address] [place] [address] [phone]        [name] Perches P, DO. Go on 08/15/2018.   Specialty: Family Medicine Why: appointment at 2:30pm Contact information: [address] [place] [address] [phone]        [name] Kendall, MD. Go on 08/11/2018.   Specialty: Cardiology Why: appointment at 2:15pm Contact information: [address] [place] [address] [phone]             Discharge Instructions  and  Discharge Medications      Allergies as of 08/10/2018   No Known Allergies     Medication List    TAKE these medications   albuterol 108 (90 Base) MCG/ACT inhaler Commonly known as: VENTOLIN HFA Inhale 2 puffs into the lungs every 6 (six) hours as needed for wheezing or shortness of breath.   allopurinol 100 MG tablet Commonly known as: ZYLOPRIM Take 1 tablet (100 mg total) by mouth daily. Notes to patient: Next dose 6/26 am   amiodarone 200 MG tablet Commonly known as: PACERONE Take 1 tablet (200 mg total) by mouth daily. Notes to patient: Next dose 6/26 am   atorvastatin 40 MG tablet Commonly known as: LIPITOR Take 1 tablet (40 mg total) by mouth daily. Notes to patient: Next dose 6/25   B-1 250 MG Tabs Take 1 tablet by mouth daily. Notes to patient: Next dose 6/26   budesonide-formoterol 160-4.5 MCG/ACT inhaler Commonly known as: SYMBICORT Inhale 2 puffs into the lungs 2 (two) times daily. Notes to patient: Next dose 6/25 pm   ferrous sulfate 325 (65 FE) MG tablet Take 1 tablet (325 mg total) by mouth 2 (two) times daily with a meal. Notes to patient: Next dose 6/25 pm   fluticasone 50 MCG/ACT nasal spray Commonly known as: FLONASE Place 2 sprays into both nostrils 2 (two) times daily. Notes to patient: Next dose 6/25 pm   gabapentin 300 MG capsule Commonly known as: NEURONTIN Take 1 capsule (300 mg total) by  mouth 3 (three) times daily. Notes to patient: Next dose 6/25 pm   ketotifen 0.025 % ophthalmic solution Commonly known as: ZADITOR 1 drop 2 (two) times daily. Notes to patient: Next dose 6/25 pm   levothyroxine 25 MCG tablet Commonly known as: SYNTHROID Take 1 tablet (25 mcg total) by mouth daily before breakfast. Notes to patient: Next dose 6/26 am   lisinopril 5 MG tablet Commonly known as: ZESTRIL Take 1 tablet (5 mg total) by mouth daily. What changed: how much to take Notes to patient: Next dose 6/26   loratadine 10 MG tablet Commonly known as: CLARITIN Take 10 mg by mouth daily. Notes to patient: Next dose 6/26 am   meloxicam 15 MG tablet Commonly known as: MOBIC Take 1 tablet (15 mg total) by mouth daily. Notes to patient: Next dose 6/26 am   metoprolol succinate 25 MG 24 hr tablet Commonly known as: TOPROL-XL Take 1 tablet (25 mg total) by mouth 2 (two) times daily. Notes to patient: Next dose 6/25 pm   nitroGLYCERIN 0.4 MG SL tablet Commonly known as: NITROSTAT Place 1 tablet (0.4 mg total) under the tongue every 5 (five) minutes as needed for chest pain.   oxyCODONE  5 MG immediate release tablet Commonly known as: Oxy IR/ROXICODONE Take 1 tablet (5 mg total) by mouth every 6 (six) hours as needed for up to 3 days for moderate pain. Notes to patient: Next dose 7pm   potassium chloride SA 20 MEQ tablet Commonly known as: K-DUR Take 1 tablet (20 mEq total) by mouth 2 (two) times daily. Notes to patient: Next dose 6/25 pm   rivaroxaban 20 MG Tabs tablet Commonly known as: XARELTO Take 1 tablet (20 mg total) by mouth daily with supper. Notes to patient: Next dose 6/26 pm   spironolactone 25 MG tablet Commonly known as: ALDACTONE Take 1 tablet (25 mg total) by mouth daily. Notes to patient: Next dose 6/26   sulfamethoxazole-trimethoprim 800-160 MG tablet Commonly known as: BACTRIM DS Take 1 tablet by mouth every 12 (twelve) hours. Notes to patient:  Next dose am   torsemide 20 MG tablet Commonly known as: DEMADEX Take 3 tablets (60 mg total) by mouth daily. What changed: how much to take Notes to patient: Next dose 08/11/18 am   traZODone 150 MG tablet Commonly known as: DESYREL Take 1 tablet (150 mg total) by mouth at bedtime as needed for sleep.         Diet and Activity recommendation: See Discharge Instructions above   Consults obtained ;cardiology.   Major procedures and Radiology Reports - PLEASE review detailed and final reports for all details, in brief -      Dg Chest Portable 1 View  Result Date: 08/06/2018 CLINICAL DATA:  Chest pain and shortness of breath today. EXAM: PORTABLE CHEST 1 VIEW COMPARISON:  07/18/2018 FINDINGS: Lordotic technique is demonstrated. Lungs are adequately inflated and demonstrate mild stable cardiomegaly with minimal vascular congestion. No lobar consolidation, effusion or pneumothorax. Remainder the exam is unchanged. IMPRESSION: Stable cardiomegaly with suggestion mild vascular congestion. Electronically Signed   By: Elberta Fortisaniel  Boyle M.D.   On: 08/06/2018 15:12   Dg Chest Port 1 View  Result Date: 07/18/2018 CLINICAL DATA:  Left-sided chest pain EXAM: PORTABLE CHEST 1 VIEW COMPARISON:  05/29/2018 FINDINGS: The cardiac silhouette is enlarged. There is mild volume overload. No pneumothorax. No large pleural effusion. No acute osseous abnormality. IMPRESSION: Cardiomegaly with mild volume overload. Electronically Signed   By: Katherine Mantlehristopher  Green M.D.   On: 07/18/2018 01:41    Micro Results     Recent Results (from the past 240 hour(s))  SARS Coronavirus 2 (CEPHEID- Performed in [facility] hospital lab), Hosp Order     Status: None   Collection Time: 08/06/18  4:45 PM   Specimen: Nasopharyngeal Swab  Result Value Ref Range Status   SARS Coronavirus 2 NEGATIVE NEGATIVE Final    Comment: (NOTE) If result is NEGATIVE SARS-CoV-2 target nucleic acids are NOT DETECTED. The SARS-CoV-2 RNA  is generally detectable in upper and lower  respiratory specimens during the acute phase of infection. The lowest  concentration of SARS-CoV-2 viral copies this assay can detect is 250  copies / mL. A negative result does not preclude SARS-CoV-2 infection  and should not be used as the sole basis for treatment or other  patient management decisions.  A negative result may occur with  improper specimen collection / handling, submission of specimen other  than nasopharyngeal swab, presence of viral mutation(s) within the  areas targeted by this assay, and inadequate number of viral copies  (<250 copies / mL). A negative result must be combined with clinical  observations, patient history, and epidemiological information. If result is  POSITIVE SARS-CoV-2 target nucleic acids are DETECTED. The SARS-CoV-2 RNA is generally detectable in upper and lower  respiratory specimens dur ing the acute phase of infection.  Positive  results are indicative of active infection with SARS-CoV-2.  Clinical  correlation with patient history and other diagnostic information is  necessary to determine patient infection status.  Positive results do  not rule out bacterial infection or co-infection with other viruses. If result is PRESUMPTIVE POSTIVE SARS-CoV-2 nucleic acids MAY BE PRESENT.   A presumptive positive result was obtained on the submitted specimen  and confirmed on repeat testing.  While 2019 novel coronavirus  (SARS-CoV-2) nucleic acids may be present in the submitted sample  additional confirmatory testing may be necessary for epidemiological  and / or clinical management purposes  to differentiate between  SARS-CoV-2 and other Sarbecovirus currently known to infect humans.  If clinically indicated additional testing with an alternate test  methodology [phone]) is advised. The SARS-CoV-2 RNA is generally  detectable in upper and lower respiratory sp ecimens during the acute  phase of  infection. The expected result is Negative. Fact Sheet for Patients:  BoilerBrush.com.cyhttps://www.fda.gov/media/136312/download Fact Sheet for Healthcare Providers: https://pope.com/https://www.fda.gov/media/136313/download This test is not yet approved or cleared by the [place] FDA and has been authorized for detection and/or diagnosis of SARS-CoV-2 by FDA under an Emergency Use Authorization (EUA).  This EUA will remain in effect (meaning this test can be used) for the duration of the COVID-19 declaration under Section 564(b)(1) of the Act, 21 U.S.C. section 360bbb-3(b)(1), unless the authorization is terminated or revoked sooner. Performed at [facility], [address] [place], [place] [address]   Aerobic/Anaerobic Culture (surgical/deep wound)     Status: None (Preliminary result)   Collection Time: 08/07/18  6:51 AM   Specimen: Wound  Result Value Ref Range Status   Specimen Description   Final    WOUND LEFT LEG LOWER Performed at [facility], [address] [place], [place] [address]    Special Requests   Final    Normal Performed at [facility], [address] [place], [place] [address]    Gram Stain   Final    MODERATE DEGENERATED CELLULAR MATERIAL PRESENT RARE WBC PRESENT,BOTH PMN AND MONONUCLEAR MODERATE GRAM POSITIVE COCCI IN PAIRS Performed at [facility], [address] [place], [place] [address]    Culture   Final    MODERATE STAPHYLOCOCCUS AUREUS FEW ENTEROBACTER CLOACAE MODERATE GROUP B STREP(S.AGALACTIAE)ISOLATED TESTING AGAINST S. AGALACTIAE NOT ROUTINELY PERFORMED DUE TO PREDICTABILITY OF AMP/PEN/VAN SUSCEPTIBILITY. NO ANAEROBES ISOLATED; CULTURE IN PROGRESS FOR 5 DAYS    Report Status PENDING  Incomplete   Organism ID, Bacteria STAPHYLOCOCCUS AUREUS  Final   Organism ID, Bacteria ENTEROBACTER CLOACAE  Final      Susceptibility   Enterobacter cloacae - MIC*    CEFAZOLIN >=64 RESISTANT Resistant     CEFEPIME <=1 SENSITIVE Sensitive      CEFTAZIDIME <=1 SENSITIVE Sensitive     CEFTRIAXONE <=1 SENSITIVE Sensitive     CIPROFLOXACIN <=0.25 SENSITIVE Sensitive     GENTAMICIN <=1 SENSITIVE Sensitive     IMIPENEM <=0.25 SENSITIVE Sensitive     TRIMETH/SULFA <=20 SENSITIVE Sensitive     PIP/TAZO <=4 SENSITIVE Sensitive     * FEW ENTEROBACTER CLOACAE   Staphylococcus aureus - MIC*    CIPROFLOXACIN <=0.5 SENSITIVE Sensitive     ERYTHROMYCIN <=0.25 SENSITIVE Sensitive     GENTAMICIN <=0.5 SENSITIVE Sensitive     OXACILLIN  0.5 SENSITIVE Sensitive     TETRACYCLINE <=1 SENSITIVE Sensitive     VANCOMYCIN 1 SENSITIVE Sensitive     TRIMETH/SULFA <=10 SENSITIVE Sensitive     CLINDAMYCIN <=0.25 SENSITIVE Sensitive     RIFAMPIN <=0.5 SENSITIVE Sensitive     Inducible Clindamycin NEGATIVE Sensitive     * MODERATE STAPHYLOCOCCUS AUREUS  Aerobic/Anaerobic Culture (surgical/deep wound)     Status: None (Preliminary result)   Collection Time: 08/07/18  6:51 AM   Specimen: Wound  Result Value Ref Range Status   Specimen Description   Final    WOUND RIGHT LEG LOWER Performed at [facility], [address], [place], [place] [address]    Special Requests   Final    NONE Performed at [facility], [address] [place], [place] [address]    Gram Stain   Final    MODERATE DEGENERATED CELLULAR MATERIAL PRESENT NO WBC SEEN NO ORGANISMS SEEN Performed at [facility], [address] [place], [place] [address]    Culture   Final    FEW STAPHYLOCOCCUS AUREUS MODERATE GROUP B STREP(S.AGALACTIAE)ISOLATED TESTING AGAINST S. AGALACTIAE NOT ROUTINELY PERFORMED DUE TO PREDICTABILITY OF AMP/PEN/VAN SUSCEPTIBILITY. NO ANAEROBES ISOLATED; CULTURE IN PROGRESS FOR 5 DAYS    Report Status PENDING  Incomplete   Organism ID, Bacteria STAPHYLOCOCCUS AUREUS  Final      Susceptibility   Staphylococcus aureus - MIC*    CIPROFLOXACIN <=0.5 SENSITIVE Sensitive     ERYTHROMYCIN <=0.25 SENSITIVE Sensitive     GENTAMICIN <=0.5  SENSITIVE Sensitive     OXACILLIN <=0.25 SENSITIVE Sensitive     TETRACYCLINE <=1 SENSITIVE Sensitive     VANCOMYCIN 1 SENSITIVE Sensitive     TRIMETH/SULFA <=10 SENSITIVE Sensitive     CLINDAMYCIN <=0.25 SENSITIVE Sensitive     RIFAMPIN <=0.5 SENSITIVE Sensitive     Inducible Clindamycin NEGATIVE Sensitive     * FEW STAPHYLOCOCCUS AUREUS       Today   Subjective:   Alice Badenhop today has no headache,no chest abdominal pain,no new weakness tingling or numbness, feels much better wants to go home today.  Objective:   Blood pressure (!) 122/49, pulse 72, temperature 98.1 F (36.7 C), temperature source Oral, resp. rate 18, height 5\' 8"  (1.727 m), weight (!) 187.2 kg, SpO2 100 %.  No intake or output data in the 24 hours ending 08/11/18 1519  Exam Awake Alert, Oriented x 3, No new F.N deficits, Normal affect [place].AT,PERRAL morbidly obese male. Supple Neck,No JVD, No cervical lymphadenopathy appriciated.  Symmetrical Chest wall movement, Good air movement bilaterally, CTAB RRR,No Gallops,Rubs or new Murmurs, No Parasternal Heave +ve B.Sounds, Abd Soft, Non tender, No organomegaly appriciated, No rebound -guarding or rigidity. Bilateral leg edema and venous stasis and wound no wraps present.  Data Review   CBC w Diff:  Lab Results  Component Value Date   WBC 6.0 08/07/2018   HGB 10.7 (L) 08/07/2018   HGB 11.1 (L) 07/04/2018   HCT 34.8 (L) 08/07/2018   HCT 37.1 (L) 07/04/2018   PLT 231 08/07/2018   PLT 267 07/04/2018   LYMPHOPCT 8 08/06/2018   MONOPCT 8 08/06/2018   EOSPCT 1 08/06/2018   BASOPCT 1 08/06/2018    CMP:  Lab Results  Component Value Date   NA 134 (L) 08/09/2018   NA 138 07/04/2018   K 4.7 08/09/2018   CL 96 (L) 08/09/2018   CO2 28 08/09/2018   BUN 19 08/09/2018   BUN 19  07/04/2018   CREATININE 1.00 08/09/2018   PROT 7.1 08/06/2018   PROT 7.3 07/04/2018   ALBUMIN 3.5 08/06/2018   ALBUMIN 4.2 07/04/2018   BILITOT 0.3 08/06/2018   BILITOT 0.3  07/04/2018   ALKPHOS 74 08/06/2018   AST 19 08/06/2018   ALT 11 08/06/2018  .   Total Time in preparing paper work, data evaluation and todays exam - 35 minutes  Katha HammingSnehalatha Jadalynn Burr M.D on 08/10/2018 at 3:19 PM    Note: This dictation was prepared with Dragon dictation along with smaller phrase technology. Any transcriptional errors that result from this process are unintentional.

## 2018-08-12 LAB — AEROBIC/ANAEROBIC CULTURE W GRAM STAIN (SURGICAL/DEEP WOUND): Special Requests: NORMAL

## 2018-08-14 ENCOUNTER — Ambulatory Visit: Payer: Medicaid Other | Admitting: Student in an Organized Health Care Education/Training Program

## 2018-08-15 ENCOUNTER — Ambulatory Visit: Payer: Self-pay

## 2018-08-15 ENCOUNTER — Ambulatory Visit: Payer: Medicaid Other | Admitting: Family Medicine

## 2018-08-15 ENCOUNTER — Telehealth: Payer: Self-pay

## 2018-08-15 ENCOUNTER — Ambulatory Visit: Payer: Self-pay | Admitting: Family Medicine

## 2018-08-19 DIAGNOSIS — I872 Venous insufficiency (chronic) (peripheral): Secondary | ICD-10-CM | POA: Diagnosis not present

## 2018-08-21 ENCOUNTER — Telehealth: Payer: Self-pay | Admitting: Family Medicine

## 2018-08-21 NOTE — Telephone Encounter (Signed)
Perfect. Thanks.

## 2018-08-21 NOTE — Telephone Encounter (Signed)
Copied from [place] [id number]. Topic: General - Other >> Aug 15, 2018 12:08 PM Celene Kras A wrote: Reason for CRM: Darlina Guys, from [facility], called stating they would be able to start servicing pt on 08/19/2018. Callback # [phone] Please advise.

## 2018-08-22 ENCOUNTER — Ambulatory Visit: Payer: Self-pay | Admitting: Pharmacist

## 2018-08-22 ENCOUNTER — Encounter: Payer: Self-pay | Admitting: Family Medicine

## 2018-08-22 ENCOUNTER — Telehealth: Payer: Self-pay

## 2018-08-22 ENCOUNTER — Other Ambulatory Visit: Payer: Self-pay

## 2018-08-22 ENCOUNTER — Ambulatory Visit: Payer: Self-pay | Admitting: *Deleted

## 2018-08-22 ENCOUNTER — Ambulatory Visit (INDEPENDENT_AMBULATORY_CARE_PROVIDER_SITE_OTHER): Payer: Medicaid Other | Admitting: Family Medicine

## 2018-08-22 VITALS — BP 121/73 | HR 60 | Temp 98.4°F

## 2018-08-22 DIAGNOSIS — I5023 Acute on chronic systolic (congestive) heart failure: Secondary | ICD-10-CM

## 2018-08-22 DIAGNOSIS — R7989 Other specified abnormal findings of blood chemistry: Secondary | ICD-10-CM | POA: Diagnosis not present

## 2018-08-22 DIAGNOSIS — M25562 Pain in left knee: Secondary | ICD-10-CM

## 2018-08-22 DIAGNOSIS — M5441 Lumbago with sciatica, right side: Secondary | ICD-10-CM | POA: Diagnosis not present

## 2018-08-22 DIAGNOSIS — M25561 Pain in right knee: Secondary | ICD-10-CM

## 2018-08-22 DIAGNOSIS — I5022 Chronic systolic (congestive) heart failure: Secondary | ICD-10-CM

## 2018-08-22 DIAGNOSIS — M5442 Lumbago with sciatica, left side: Secondary | ICD-10-CM

## 2018-08-22 DIAGNOSIS — G8929 Other chronic pain: Secondary | ICD-10-CM | POA: Diagnosis not present

## 2018-08-22 DIAGNOSIS — D649 Anemia, unspecified: Secondary | ICD-10-CM

## 2018-08-22 MED ORDER — DULOXETINE HCL 20 MG PO CPEP: 20.0000 mg | ORAL_CAPSULE | Freq: Every day | ORAL | 3 refills | Status: DC

## 2018-08-22 NOTE — Telephone Encounter (Addendum)
[place] attempted to contact patient for Covid-pre screen. X 2

## 2018-08-22 NOTE — Chronic Care Management (AMB) (Signed)
  Chronic Care Management   Follow Up Note   08/22/2018 Name: [name] [name] MRN: [id number] DOB: 04-26-1960  Referred by: [name] Roys, DO Reason for referral : Chronic Care Management (CHF, )   [name] [name] is a 58 y.o. year old male who is a primary care patient of [name] Roys, DO. The CCM team was consulted for assistance with chronic disease management and care coordination needs.    Review of patient status, including review of consultants reports, relevant laboratory and other test results, and collaboration with appropriate care team members and the patient's provider was performed as part of comprehensive patient evaluation and provision of chronic care management services.    Goals Addressed            This Visit's Progress   . I need help managing my overall health (pt-stated)       Current Barriers:  . Lacks caregiver support.  . Film/video editor.  . Literacy barriers . Transportation barriers . Difficulty obtaining medications . Cognitive Deficits . Chronic Disease Management support and education needs related to managing multiple chronic diseases as evidenced by multiple hospitalizations in the last 6 months.  Nurse Case Manager Clinical Goal(s):  [name] [name] Kitchen Over the next 90 days, patient will not experience hospital admission. Hospital Admissions in last 6 months = 5  Interventions:  . Evaluation of current treatment plan related to COPD and HF and patient's adherence to plan as established by provider. . Provided education to patient re: HF . Collaborated with PCP regarding patient's expressed depression and pain . Discussed plans with patient for ongoing care management follow up and provided patient with direct contact information for care management team . Provided patient with written educational materials related to HF and COPD in the form of [name] [name] Calendar to also assist with keeping up with appointments . Reviewed scheduled/upcoming provider  appointments including:  . Advised patient, providing education and rationale, to weigh daily and record, calling PCP/Cardiology for weight gain of 3lbs overnight or 5 pounds in a week.  . Care Guide referral for transportation and food resources . Reminded pt of the importance of attending cardiology appointment tomorrow  . Empathetic listening to patient who talked tearfully about the loss of [name].  . Patient stated he was unable to obtain c-pap supplies related to a past balance to [employer].  . Patient stated he has a scale but it is unable to weigh him correctly b/c he is >400lbs . Patient is being seen by [name] [name] for [employer], has refused HH PT related to pain- stated he would reconsider if pain was better controlled.  . Patient stated he has been on SSI since 2010 and is unsure why he is ineligible for medicare. Plan to discuss this with [name] [name].    Patient Self Care Activities:  . Currently UNABLE TO independently manage multiple chronic disease processes as evidenced by 5 hospital admits in the last 6 mos  Initial goal documentation Previous goal of weight loss d/c'd related to patient's new focus          The care management team will reach out to the patient again over the next 14 days.  The patient has been provided with contact information for the care management team and has been advised to call with any health related questions or concerns.   [name] [name] [name] Fuhs RN, BSN Nurse Case Editor, commissioning [facility]  [phone]) Business Mobile

## 2018-08-22 NOTE — Progress Notes (Signed)
BP 121/73   Pulse 60   Temp 98.4 F (36.9 C) (Oral)   SpO2 99%    Subjective:    Patient ID: [name] [name], male    DOB: 01-01-1961, 58 y.o.   MRN: [id number]  HPI: [name] [name] is a 58 y.o. male  Chief Complaint  Patient presents with  . Hospitalization Follow-up   Transition of Care Hospital Follow up.   Hospital/Facility: [facility] D/C Physician: Dr. Luberta Mutter D/C Date: 08/10/18  Records Requested: 08/22/18  Records Received: 08/22/18  Records Reviewed: 08/22/18   Diagnoses on Discharge: COPD exacerbation, CHF exacerbation  Date of interactive Contact within 48 hours of discharge: 08/10/18 Contact was through: phone  Date of 7 day or 14 day face-to-face visit:  08/22/18  within 14 days  Outpatient Encounter Medications as of 08/22/2018  Medication Sig  . allopurinol (ZYLOPRIM) 100 MG tablet Take 1 tablet (100 mg total) by mouth daily.  [name] amiodarone (PACERONE) 200 MG tablet Take 1 tablet (200 mg total) by mouth daily.  [name] atorvastatin (LIPITOR) 40 MG tablet Take 1 tablet (40 mg total) by mouth daily.  . budesonide-formoterol (SYMBICORT) 160-4.5 MCG/ACT inhaler Inhale 2 puffs into the lungs 2 (two) times daily.  . Colchicine (MITIGARE) 0.6 MG CAPS Take 1 tablet by mouth every other day.  . ferrous sulfate 325 (65 FE) MG tablet Take 1 tablet (325 mg total) by mouth 2 (two) times daily with a meal.  . fluticasone (FLONASE) 50 MCG/ACT nasal spray Place 2 sprays into both nostrils 2 (two) times daily.  [name] gabapentin (NEURONTIN) 300 MG capsule Take 1 capsule (300 mg total) by mouth 3 (three) times daily.  [name] ketotifen (ZADITOR) 0.025 % ophthalmic solution 1 drop 2 (two) times daily.  [name] levothyroxine (SYNTHROID) 25 MCG tablet Take 1 tablet (25 mcg total) by mouth daily before breakfast.  . lisinopril (ZESTRIL) 5 MG tablet Take 1 tablet (5 mg total) by mouth daily.  [name] loratadine (CLARITIN) 10 MG tablet Take 10 mg by mouth daily.  . metoprolol succinate (TOPROL-XL) 25 MG 24 hr tablet Take  1 tablet (25 mg total) by mouth 2 (two) times daily.  . mometasone-formoterol (DULERA) 200-5 MCG/ACT AERO Inhale 2 puffs into the lungs 2 (two) times daily.  . nitroGLYCERIN (NITROSTAT) 0.4 MG SL tablet Place 1 tablet (0.4 mg total) under the tongue every 5 (five) minutes as needed for chest pain. (Patient not taking: Reported on 08/22/2018)  . potassium chloride SA (K-DUR) 20 MEQ tablet Take 1 tablet (20 mEq total) by mouth 2 (two) times daily.  . rivaroxaban (XARELTO) 20 MG TABS tablet Take 1 tablet (20 mg total) by mouth daily with supper.  [name] spironolactone (ALDACTONE) 25 MG tablet Take 1 tablet (25 mg total) by mouth daily.  [name] torsemide (DEMADEX) 20 MG tablet Take 3 tablets (60 mg total) by mouth daily.  . traZODone (DESYREL) 150 MG tablet Take 1 tablet (150 mg total) by mouth at bedtime as needed for sleep.  . [DISCONTINUED] sulfamethoxazole-trimethoprim (BACTRIM DS) 800-160 MG tablet Take 1 tablet by mouth every 12 (twelve) hours. (Patient not taking: Reported on 08/22/2018)  . albuterol (VENTOLIN HFA) 108 (90 Base) MCG/ACT inhaler Inhale 2 puffs into the lungs every 6 (six) hours as needed for wheezing or shortness of breath. (Patient not taking: Reported on 08/22/2018)  . DULoxetine (CYMBALTA) 20 MG capsule Take 1 capsule (20 mg total) by mouth daily.  . meloxicam (MOBIC) 15 MG tablet Take 1 tablet (15 mg total) by mouth daily.  . [  DISCONTINUED] Thiamine HCl (B-1) 250 MG TABS Take 1 tablet by mouth daily.   No facility-administered encounter medications on file as of 08/22/2018.   Per Hospitalist" "Hospital Course   #1 acute on chronic heart failure with reduced ejection fraction secondary to nonischemic cardiomyopathy, patient received IV Lasix, [facility], transition to high-dose torsemide 60 mg daily, discharged home.  Regarding heart failure patient is on Toprol, lisinopril, spironolactone.  Outpatient follow-up with [facility] to see if patient can be started on Entresto.  2.  Elevated troponins, patient has been evaluated troponins and flat trending secondary to supply demand ischemia with volume overload, A. fib with RVR, anemia, patient has chronic chest pain dating back 10 years, patient is seen by [facility], received Xarelto. 3.  Atrial fibrillation with RVR, patient had cardioversion on June 23, and maintained sinus rhythm after the cardioversion, discharged home with Toprol-XL, amiodarone, Xarelto. 4.  Morbid obesity: Weight loss advised. Chronic venous stasis.  Patient seen by vascular, received Unna boots.  Patient received IV antibiotics with vancomycin during the hospital stay, discharged home with Bactrim patient required only 2 days of Bactrim finished the course, #5 chronic back pain, multiple pain issues, patient keeps complaining he had multiple back operations, always says that his neck pain, back pain, gave limited supply of short-acting oxycodone.  Referred him to Dr. Laban EmperorNaveira for pain management.   Discharge him with home health. Discharge Condition: Stable"  Diagnostic Tests Reviewed:   CLINICAL DATA:  Chest pain and shortness of breath today.  EXAM: PORTABLE CHEST 1 VIEW  COMPARISON:  07/18/2018  FINDINGS: Lordotic technique is demonstrated. Lungs are adequately inflated and demonstrate mild stable cardiomegaly with minimal vascular congestion. No lobar consolidation, effusion or pneumothorax. Remainder the exam is unchanged.  IMPRESSION: Stable cardiomegaly with suggestion mild vascular congestion.  Disposition: Home with Home Health  Consults: Cardiology, vascular  Discharge Instructions:  Follow up here and with cardiology  Disease/illness Education:  Discussed today  Home Health/Community Services Discussions/Referrals: Home health out  Establishment or re-establishment of referral orders for community resources: Already done  Discussion with other health care providers: N/A  Assessment and Support of treatment  regimen adherence: Guarded  Appointments Coordinated with: Patient  Education for self-management, independent living, and ADLs: Given today  Has an appointment with cardiology tomorrow. Since coming out of the hospital, he has not been feeling well.   ANEMIA Anemia status: unknown Etiology of anemia: iron deficiency Duration of anemia treatment: 6 weeks Compliance with treatment: poor compliance Iron supplementation side effects: no Severity of anemia: mild Fatigue: yes Decreased exercise tolerance: yes  Dyspnea on exertion: yes Palpitations: no Bleeding: no Pica: no  HYPOTHYROIDISM Thyroid control status:unknown Satisfied with current treatment? yes Medication side effects: no Medication compliance: poor compliance Recent dose adjustment:just started on medicine Fatigue: yes Cold intolerance: no Heat intolerance: no Weight gain: yes Weight loss: no Constipation: no Diarrhea/loose stools: no Palpitations: no Lower extremity edema: no Anxiety/depressed mood: yes  Lots of pain in his back and his knees. Does not want to do any PT. Does not want to go back to see [facility]. Notes that he is new here and is not sure if he could go to another county for pain management. He notes that his pain is constant and severe. Does not think that his meloxicam or his gabapentin helps. Thinks that his gabapentin is just making him gain weight. Wants soma, states that it is the only thing that helps.  No other concerns or complaints at this time.   Relevant past medical, surgical, family and social history reviewed and updated as indicated. Interim medical history since our last visit reviewed. Allergies and medications reviewed and updated.  Review of Systems  Constitutional: Positive for fatigue. Negative for activity change, appetite change, chills, diaphoresis, fever and unexpected weight change.  HENT: Negative.   Respiratory: Positive for shortness of breath. Negative  for apnea, cough, choking, chest tightness, wheezing and stridor.   Cardiovascular: Positive for leg swelling. Negative for chest pain and palpitations.  Gastrointestinal: Negative.   Musculoskeletal: Positive for arthralgias, back pain and myalgias. Negative for gait problem, joint swelling, neck pain and neck stiffness.  Skin: Negative.   Neurological: Negative.   Psychiatric/Behavioral: Positive for dysphoric mood. Negative for agitation, behavioral problems, confusion, decreased concentration, hallucinations, self-injury, sleep disturbance and suicidal ideas. The patient is not nervous/anxious and is not hyperactive.     Per HPI unless specifically indicated above     Objective:    BP 121/73   Pulse 60   Temp 98.4 F (36.9 C) (Oral)   SpO2 99%   Wt Readings from Last 3 Encounters:  08/10/18 (!) 412 lb 11.2 oz (187.2 kg)  07/23/18 (!) 408 lb 1.6 oz (185.1 kg)  07/14/18 (!) 393 lb (178.3 kg)    Physical Exam Vitals signs and nursing note reviewed.  Constitutional:      General: He is not in acute distress.    Appearance: Normal appearance. He is obese. He is not ill-appearing, toxic-appearing or diaphoretic.  HENT:     Head: Normocephalic and atraumatic.     Right Ear: External ear normal.     Left Ear: External ear normal.     Nose: Nose normal.     Mouth/Throat:     Mouth: Mucous membranes are moist.     Pharynx: Oropharynx is clear.  Eyes:     General: No scleral icterus.       Right eye: No discharge.        Left eye: No discharge.     Extraocular Movements: Extraocular movements intact.     Conjunctiva/sclera: Conjunctivae normal.     Pupils: Pupils are equal, round, and reactive to light.  Neck:     Musculoskeletal: Normal range of motion and neck supple.  Cardiovascular:     Rate and Rhythm: Normal rate and regular rhythm.     Pulses: Normal pulses.     Heart sounds: Normal heart sounds. No murmur. No friction rub. No gallop.   Pulmonary:     Effort:  Pulmonary effort is normal. No respiratory distress.     Breath sounds: Normal breath sounds. No stridor. No wheezing, rhonchi or rales.  Chest:     Chest wall: No tenderness.  Musculoskeletal: Normal range of motion.     Right lower leg: Edema present.     Left lower leg: Edema present.  Skin:    General: Skin is warm and dry.     Capillary Refill: Capillary refill takes less than 2 seconds.     Coloration: Skin is not jaundiced or pale.     Findings: No bruising, erythema, lesion or rash.  Neurological:     General: No focal deficit present.     Mental Status: He is alert and oriented to person, place, and time. Mental status is at baseline.  Psychiatric:        Mood and Affect: Mood normal.        Behavior:  Behavior normal.        Thought Content: Thought content normal.        Judgment: Judgment normal.     Results for orders placed or performed in visit on 08/22/18  TSH  Result Value Ref Range   TSH 8.660 (H) 0.450 - 4.500 uIU/mL  CBC with Differential/Platelet  Result Value Ref Range   WBC 6.8 3.4 - 10.8 x10E3/uL   RBC 4.72 4.14 - 5.80 x10E6/uL   Hemoglobin 13.2 13.0 - 17.7 g/dL   Hematocrit 96.039.8 45.437.5 - 51.0 %   MCV 84 79 - 97 fL   MCH 28.0 26.6 - 33.0 pg   MCHC 33.2 31.5 - 35.7 g/dL   RDW 09.821.1 (H) 11.911.6 - 14.715.4 %   Platelets 318 150 - 450 x10E3/uL   Neutrophils 67 Not Estab. %   Lymphs 18 Not Estab. %   Monocytes 10 Not Estab. %   Eos 3 Not Estab. %   Basos 1 Not Estab. %   Neutrophils Absolute 4.7 1.4 - 7.0 x10E3/uL   Lymphocytes Absolute 1.2 0.7 - 3.1 x10E3/uL   Monocytes Absolute 0.7 0.1 - 0.9 x10E3/uL   EOS (ABSOLUTE) 0.2 0.0 - 0.4 x10E3/uL   Basophils Absolute 0.1 0.0 - 0.2 x10E3/uL   Immature Granulocytes 1 Not Estab. %   Immature Grans (Abs) 0.0 0.0 - 0.1 x10E3/uL  Iron and TIBC  Result Value Ref Range   Total Iron Binding Capacity 444 250 - 450 ug/dL   UIBC 829396 (H) 562111 - 130343 ug/dL   Iron 48 38 - 865169 ug/dL   Iron Saturation 11 (L) 15 - 55 %   Ferritin  Result Value Ref Range   Ferritin 83 30 - 400 ng/mL  Basic metabolic panel  Result Value Ref Range   Glucose 102 (H) 65 - 99 mg/dL   BUN 42 (H) 6 - 24 mg/dL   Creatinine, Ser 7.841.67 (H) 0.76 - 1.27 mg/dL   GFR calc non Af Amer 45 (L) >59 mL/min/1.73   GFR calc Af Amer 52 (L) >59 mL/min/1.73   BUN/Creatinine Ratio 25 (H) 9 - 20   Sodium 135 134 - 144 mmol/L   Potassium 5.0 3.5 - 5.2 mmol/L   Chloride 96 96 - 106 mmol/L   CO2 21 20 - 29 mmol/L   Calcium 10.0 8.7 - 10.2 mg/dL      Assessment & Plan:   Problem List Items Addressed This Visit      Cardiovascular and Mediastinum   Acute on chronic systolic CHF (congestive heart failure) (HCC)    Lungs clear today. Weight still up, but likely due to poor eating as opposed to fluid. Seeing cardiology tomorrow. Continue to monitor closely. Call with any concerns. Working with CCM- likely needs to get in with CHF clinic.       Relevant Orders   Basic metabolic panel (Completed)     Nervous and Auditory   Chronic bilateral low back pain with bilateral sciatica    Does not want to go back to see [facility] pain management. Discussed that there is not another pain management in [place]. Will get him in with orthopedics to see if they can help with his knees and will get him in with a different pain management. He is open to shots, but is too heavy for the [facility] table. He is not an opiod candidate with them due to his weight and OSA. Will start cymbalta to try to help with pain. He states that  gabapentin and meloxicam are not helping. Advised him that if he would like to stop them, we can discuss gabapentin taper. Continue to monitor.       Relevant Medications   DULoxetine (CYMBALTA) 20 MG capsule   Other Relevant Orders   Ambulatory referral to Pain Clinic     Other   Anemia - Primary    Due for recheck. Unclear if he's been taking his iron. Will await results and treat as needed.       Relevant Orders   CBC with  Differential/Platelet (Completed)   Iron and TIBC (Completed)   Ferritin (Completed)   Abnormal thyroid blood test    Due for recheck on his labs. Very unclear if he's been taking his thyroid medicine. Await results and treat as needed.       Relevant Orders   TSH (Completed)   Chronic pain of both knees    Will get him into orthopedics. Would benefit from weight loss. Discussed that he has gained 20lbs in the last month- he blames this on the gabapentin. Advised him to try to lose 1-2lbs per week. Continue to monitor closely.      Relevant Medications   DULoxetine (CYMBALTA) 20 MG capsule   Other Relevant Orders   Ambulatory referral to Orthopedic Surgery       Follow up plan: Return in about 2 weeks (around 09/05/2018).

## 2018-08-22 NOTE — Patient Instructions (Addendum)
Visit Information  Goals Addressed            This Visit's Progress     Patient Stated   . "I'm on a lot of medications" (pt-stated)       Current Barriers:  [name] Knowledge Deficits related to optimal medication management . Literacy Barriers - is able to spell out medication names to me and instructions over the phone, but reads slowly  . Medication Management - previously, provided patient a pill box to help organize medications. However, he notes that he has not used it. He associates pill boxes with negative connotations of his parents being sick and passing away, and he doesn't want to fill a pill box . Financial Barriers - previously denied problems affording medications, but notes that he doesn't have $3 to pick up his amiodarone today. He also notes that he has had some confrontations with [facility], and is interested in transferring to another pharmacy - Patient interested in transferring prescriptions to [facility]  . Medication discrepancies identified today: o Taking torsemide 40 mg instead of 60 mg since discharge o Taking both metoprolol XR 25 mg BID and 50 mg BID since discharge, as he had both bottles o Taking Dulera 2 puffs BID and Symbicort 2 puffs PRN; he believed Symbicort (in a red inhaler, just like Proair) to be his rescue inhaler . Notes significant pain, but wishes to stop gabapentin and meloxicam due to being unable to afford the medications. He also doesn't believe that trazodone is "doing anything" in regards to his mood and sleep. . Discussed that loratadine and fluticasone nasal are for allergies. Patient wishes to stop these  Pharmacist Clinical Goal(s):  [name] Over the next 90 days, patient will work with CCM team to address needs related to optimized medication management  Interventions: . Contacted [facility] - they have his prescriptions on file. I asked that they call him tomorrow to set him up as a patient, collect his payment information,  delivery address, etc. I will also fax them his most up to date medication list after his cardiology appointment tomorrow.  . Discussed the above medication discrepancies with Dr. Wynetta Emery prior to their appointment. We will continue to follow and support the patient with medication management changes made by cardiology . Explained that [facility] and Symbicort are duplicative. Counseled to continue Dulera 2 puffs BID until he finishes his supply, then switch to Symbicort  . Encouraged patient to restart allergy medications if allergy symptoms become bothersome  Patient Self Care Activities:  . Calls pharmacy for medication refills . Calls provider office for new concerns or questions  Please see past updates related to this goal by clicking on the "Past Updates" button in the selected goal         The patient verbalized understanding of instructions provided today and declined a print copy of patient instruction materials.   Plan:  - Will fax updated medication list to [facility] after patient's cardiology appointment tomorrow - Will outreach patient in 3-4 weeks for continued medication management support - Will inbasket Ignacia Bayley, NP, prior to patient's appointment tomorrow to let him know patient has been taking torsemide 40 mg daily and metoprolol 75 mg BID since discharge

## 2018-08-22 NOTE — Chronic Care Management (AMB) (Addendum)
Chronic Care Management   Follow Up Note   08/22/2018 Name: [name] [name] MRN: [id number] DOB: May 22, 1960  Referred by: [name], DO Reason for referral : Chronic Care Management (Medication Management)   Daymein Nunnery is a 58 y.o. year old male who is a primary care patient of [name], DO. The CCM team was consulted for assistance with chronic disease management and care coordination needs.    Met with patient face to face today prior to primary care provider appointment.   Review of patient status, including review of consultants reports, relevant laboratory and other test results, and collaboration with appropriate care team members and the patient's provider was performed as part of comprehensive patient evaluation and provision of chronic care management services.    Goals Addressed            This Visit's Progress     Patient Stated   . "I'm on a lot of medications" (pt-stated)       Current Barriers:  [name] Knowledge Deficits related to optimal medication management . Literacy Barriers - is able to spell out medication names to me and instructions over the phone, but reads slowly  . Medication Management - previously, provided patient a pill box to help organize medications. However, he notes that he has not used it. He associates pill boxes with negative connotations of his parents being sick and passing away, and he doesn't want to fill a pill box . Financial Barriers - previously denied problems affording medications, but notes that he doesn't have $3 to pick up his amiodarone today. He also notes that he has had some confrontations with [place], and is interested in transferring to another pharmacy - Patient interested in transferring prescriptions to [facility]  . Medication discrepancies identified today: o Taking torsemide 40 mg instead of 60 mg since discharge o Taking both metoprolol XR 25 mg BID and 50 mg BID since discharge, as he had  both bottles o Taking Dulera 2 puffs BID and Symbicort 2 puffs PRN; he believed Symbicort (in a red inhaler, just like Proair) to be his rescue inhaler . Notes significant pain, but wishes to stop gabapentin and meloxicam due to being unable to afford the medications. He also doesn't believe that trazodone is "doing anything" in regards to his mood and sleep. . Discussed that loratadine and fluticasone nasal are for allergies. Patient wishes to stop these  Pharmacist Clinical Goal(s):  [name] Over the next 90 days, patient will work with CCM team to address needs related to optimized medication management  Interventions: . Contacted [facility] - they have his prescriptions on file. I asked that they call him tomorrow to set him up as a patient, collect his payment information, delivery address, etc. I will also fax them his most up to date medication list after his cardiology appointment tomorrow.  . Discussed the above medication discrepancies with Dr. Wynetta Emery prior to their appointment. We will continue to follow and support the patient with medication management changes made by cardiology . Explained that [facility] and Symbicort are duplicative. Counseled to continue Dulera 2 puffs BID until he finishes his supply, then switch to Symbicort  . Encouraged patient to restart allergy medications if allergy symptoms become bothersome  Patient Self Care Activities:  . Calls pharmacy for medication refills . Calls provider office for new concerns or questions  Please see past updates related to this goal by clicking on the "Past Updates" button in the selected  goal          Plan:  - Will fax updated medication list to [facility] after patient's cardiology appointment tomorrow - Will outreach patient in 3-4 weeks for continued medication management support - Will inbasket Ignacia Bayley, NP, prior to patient's appointment tomorrow to let him know patient has been taking torsemide 40 mg daily and  metoprolol 75 mg BID since discharge  [name], PharmD Clinical Pharmacist [place] [phone]

## 2018-08-23 ENCOUNTER — Encounter: Payer: Self-pay | Admitting: Family Medicine

## 2018-08-23 ENCOUNTER — Ambulatory Visit: Payer: Self-pay | Admitting: Licensed Clinical Social Worker

## 2018-08-23 ENCOUNTER — Ambulatory Visit: Payer: Medicaid Other | Admitting: Nurse Practitioner

## 2018-08-23 LAB — BASIC METABOLIC PANEL
BUN/Creatinine Ratio: 25 — ABNORMAL HIGH (ref 9–20)
BUN: 42 mg/dL — ABNORMAL HIGH (ref 6–24)
CO2: 21 mmol/L (ref 20–29)
Calcium: 10 mg/dL (ref 8.7–10.2)
Chloride: 96 mmol/L (ref 96–106)
Creatinine, Ser: 1.67 mg/dL — ABNORMAL HIGH (ref 0.76–1.27)
GFR calc Af Amer: 52 mL/min/{1.73_m2} — ABNORMAL LOW (ref >59)
GFR calc non Af Amer: 45 mL/min/{1.73_m2} — ABNORMAL LOW (ref >59)
Glucose: 102 mg/dL — ABNORMAL HIGH (ref 65–99)
Potassium: 5 mmol/L (ref 3.5–5.2)
Sodium: 135 mmol/L (ref 134–144)

## 2018-08-23 LAB — CBC WITH DIFFERENTIAL/PLATELET
Basophils Absolute: 0.1 10*3/uL (ref 0.0–0.2)
Basos: 1 %
EOS (ABSOLUTE): 0.2 10*3/uL (ref 0.0–0.4)
Eos: 3 %
Hematocrit: 39.8 % (ref 37.5–51.0)
Hemoglobin: 13.2 g/dL (ref 13.0–17.7)
Immature Grans (Abs): 0 10*3/uL (ref 0.0–0.1)
Immature Granulocytes: 1 %
Lymphocytes Absolute: 1.2 10*3/uL (ref 0.7–3.1)
Lymphs: 18 %
MCH: 28 pg (ref 26.6–33.0)
MCHC: 33.2 g/dL (ref 31.5–35.7)
MCV: 84 fL (ref 79–97)
Monocytes Absolute: 0.7 10*3/uL (ref 0.1–0.9)
Monocytes: 10 %
Neutrophils Absolute: 4.7 10*3/uL (ref 1.4–7.0)
Neutrophils: 67 %
Platelets: 318 10*3/uL (ref 150–450)
RBC: 4.72 x10E6/uL (ref 4.14–5.80)
RDW: 21.1 % — ABNORMAL HIGH (ref 11.6–15.4)
WBC: 6.8 10*3/uL (ref 3.4–10.8)

## 2018-08-23 LAB — FERRITIN: Ferritin: 83 ng/mL (ref 30–400)

## 2018-08-23 LAB — IRON AND TIBC
Iron Saturation: 11 % — ABNORMAL LOW (ref 15–55)
Iron: 48 ug/dL (ref 38–169)
Total Iron Binding Capacity: 444 ug/dL (ref 250–450)
UIBC: 396 ug/dL — ABNORMAL HIGH (ref 111–343)

## 2018-08-23 LAB — TSH: TSH: 8.66 u[IU]/mL — ABNORMAL HIGH (ref 0.450–4.500)

## 2018-08-23 NOTE — Assessment & Plan Note (Signed)
Will get him into orthopedics. Would benefit from weight loss. Discussed that he has gained 20lbs in the last month- he blames this on the gabapentin. Advised him to try to lose 1-2lbs per week. Continue to monitor closely.

## 2018-08-23 NOTE — Assessment & Plan Note (Signed)
Does not want to go back to see [facility] pain management. Discussed that there is not another pain management in [facility]. Will get him in with orthopedics to see if they can help with his knees and will get him in with a different pain management. He is open to shots, but is too heavy for the [facility] table. He is not an opiod candidate with them due to his weight and OSA. Will start cymbalta to try to help with pain. He states that gabapentin and meloxicam are not helping. Advised him that if he would like to stop them, we can discuss gabapentin taper. Continue to monitor.

## 2018-08-23 NOTE — Assessment & Plan Note (Signed)
Due for recheck on his labs. Very unclear if he's been taking his thyroid medicine. Await results and treat as needed.

## 2018-08-23 NOTE — Assessment & Plan Note (Signed)
Due for recheck. Unclear if he's been taking his iron. Will await results and treat as needed.

## 2018-08-23 NOTE — Assessment & Plan Note (Signed)
Lungs clear today. Weight still up, but likely due to poor eating as opposed to fluid. Seeing cardiology tomorrow. Continue to monitor closely. Call with any concerns. Working with CCM- likely needs to get in with CHF clinic.

## 2018-08-23 NOTE — Chronic Care Management (AMB) (Signed)
  Care Management   Follow Up Note   08/23/2018 Name: [name] [name] MRN: [id number] DOB: 05/18/1960  Referred by: [name], DO Reason for referral : Care Coordination   [name] [name] is a 58 y.o. year old male who is a primary care patient of [name], DO. The care management team was consulted for assistance with care management and care coordination needs.    Review of patient status, including review of consultants reports, relevant laboratory and other test results, and collaboration with appropriate care team members and the patient's provider was performed as part of comprehensive patient evaluation and provision of chronic care management services.    LCSW completed CCM outreach attempt today but was unable to reach patient successfully. A HIPPA compliant voice message was left encouraging patient to return call to [facility] as soon as possible as they are trying to schedule his in person assessment for PCS/Medicaid. LCSW rescheduled CCM SW appointment as well.  A HIPPA compliant phone message was left for the patient providing contact information and requesting a return call.   [name], BSW, MSW, [facility] Care Management [email or web address] Phone: [phone]

## 2018-08-23 NOTE — Patient Instructions (Signed)
Thank you allowing the Chronic Care Management Team to be a part of your care! It was a pleasure speaking with you today!   CCM (Chronic Care Management) Team   Raven Furnas RN, BSN Nurse Care Coordinator  [phone]  Catie [facility] PharmD  Clinical Pharmacist  [phone]  [name] LCSW Clinical Social Worker [phone]  Goals Addressed            This Visit's Progress   . I need help managing my overall health (pt-stated)       Current Barriers:  . Lacks caregiver support.  . Film/video editor.  . Literacy barriers . Transportation barriers . Difficulty obtaining medications . Cognitive Deficits . Chronic Disease Management support and education needs related to managing multiple chronic diseases as evidenced by multiple hospitalizations in the last 6 months.  Nurse Case Manager Clinical Goal(s):  [name] Over the next 90 days, patient will not experience hospital admission. Hospital Admissions in last 6 months = 5  Interventions:  . Evaluation of current treatment plan related to COPD and HF and patient's adherence to plan as established by provider. . Provided education to patient re: HF . Collaborated with PCP regarding patient's expressed depression and pain . Discussed plans with patient for ongoing care management follow up and provided patient with direct contact information for care management team . Provided patient with written educational materials related to HF and COPD in the form of [facility] Calendar to also assist with keeping up with appointments . Reviewed scheduled/upcoming provider appointments including:  . Advised patient, providing education and rationale, to weigh daily and record, calling PCP/Cardiology for weight gain of 3lbs overnight or 5 pounds in a week.  . Care Guide referral for transportation and food resources . Reminded pt of the importance of attending cardiology appointment tomorrow  . Empathetic listening to patient who talked  tearfully about the loss of his spouse [place].  . Patient stated he was unable to obtain c-pap supplies related to a past balance to [employer].  . Patient stated he has a scale but it is unable to weigh him correctly b/c he is >400lbs . Patient is being seen by [facility] for [employer], has refused HH PT related to pain- stated he would reconsider if pain was better controlled.  . Patient stated he has been on SSI since 2010 and is unsure why he is ineligible for medicare. Plan to discuss this with LCSW.    Patient Self Care Activities:  . Currently UNABLE TO independently manage multiple chronic disease processes as evidenced by 5 hospital admits in the last 6 mos  Initial goal documentation Previous goal of weight loss d/c'd related to patient's new focus         The patient verbalized understanding of instructions provided today and declined a print copy of patient instruction materials.   The patient has been provided with contact information for the care management team and has been advised to call with any health related questions or concerns.

## 2018-08-24 DIAGNOSIS — I872 Venous insufficiency (chronic) (peripheral): Secondary | ICD-10-CM | POA: Diagnosis not present

## 2018-08-27 ENCOUNTER — Telehealth: Payer: Self-pay | Admitting: Family Medicine

## 2018-08-27 NOTE — Progress Notes (Signed)
Cardiology Office Note Date:  08/27/2018  Patient ID:  [name] [name], DOB September 12, 1960, MRN [id number] PCP:  [name], DO  Cardiologist:  Dr. Saunders Revel, MD     Chief Complaint: Hospital follow up  History of Present Illness: [name] [name] is a 58 y.o. male with history of chronic HFrEF due to NICM ( per patient to clean caths, most recently 1 year ago in [place], [place]), persistent atrial fibrillation, COPD, morbid obesity and obstructive sleep apnea.  He presented to the emergency department on 08/07/2018 and was found to be in atrial fibrillation with a ventricular rate of 80-120 bpm.  He was diuresed with IV furosemide and received successful DCCV on 08/08/2018.  He presents to the clinic today for follow-up and states he has been taking his torsemide 40 mg twice daily instead of 60 mg daily.  His creatinine today is 1.67 with a BUN of 42.  His baseline renal function is normal at 1.00 creatinine and 19 BUN.  He states that his shortness of breath is decreased but he continues to have dyspnea on exertion.  He states he has less lower extremity edema however, his legs are wrapped in Kerlix today.  He states he is concerned about the odor on his left leg and states that his legs were rewrapped yesterday by wound care.  He states he still has vague chest discomfort that has been present for quite some time and is sporadic in nature.  He denies increased work of breathing, increased lower extremity edema, new chest pain sensations, melena, hematuria, hemoptysis, presyncope, syncope, orthopnea, and PND.  Past Medical History:  Diagnosis Date  . Asthma   . Brain damage   . COPD (chronic obstructive pulmonary disease) ([place])   . Depression   . HFrEF (heart failure with reduced ejection fraction) ([place])    a. 03/2018 Echo: EF 25-30%, diff HK. Mod dil LA.  [name] Persistent atrial fibrillation    a. 03/2018 s/p DCCV; b. CHA2DS2VASc = 1-->Xarelto; c. 05/2018 recurrent afib-->Amio initiated;   .  Sleep apnea     Past Surgical History:  Procedure Laterality Date  . CARDIOVERSION N/A 03/24/2018   Procedure: CARDIOVERSION;  Surgeon: Minna Merritts, MD;  Location: [facility] ORS;  Service: Cardiovascular;  Laterality: N/A;  . CARDIOVERSION N/A 08/08/2018   Procedure: CARDIOVERSION;  Surgeon: Nelva Bush, MD;  Location: [facility] ORS;  Service: Cardiovascular;  Laterality: N/A;  . hearnia repair     X 3- total of two surgeries  . LEG SURGERY      No outpatient medications have been marked as taking for the 08/29/18 encounter (Appointment) with Rise Mu, PA-C.    Allergies:   Patient has no known allergies.   Social History:  The patient  reports that he has never smoked. He has never used smokeless tobacco. He reports that he does not drink alcohol or use drugs.   Family History:  The patient's family history includes Heart attack in his maternal grandfather and maternal grandmother; Heart failure in his mother; Lung cancer in his father and mother.  ROS:   Review of Systems  Constitutional: Negative for chills, fever and malaise/fatigue.  Respiratory: Negative for cough, hemoptysis and shortness of breath.   Cardiovascular: Positive for chest pain. Negative for palpitations, orthopnea, claudication, leg swelling and PND.  Gastrointestinal: Negative for abdominal pain, melena, nausea and vomiting.  Genitourinary: Negative for hematuria.  Musculoskeletal: Negative for falls and myalgias.  Neurological: Negative for dizziness, sensory change, loss of consciousness, weakness  and headaches.  Endo/Heme/Allergies: Does not bruise/bleed easily.     PHYSICAL EXAM: VS:  There were no vitals taken for this visit. BMI: There is no height or weight on file to calculate BMI.  Physical Exam  Constitutional: He is oriented to person, place, and time. He appears well-developed and well-nourished.  HENT:  Head: Normocephalic and atraumatic.  Eyes: EOM are normal.  Neck: Normal range of  motion. Neck supple.  Cardiovascular: Normal rate, regular rhythm and normal heart sounds.  Pulmonary/Chest: Effort normal and breath sounds normal.  Abdominal: Soft. Bowel sounds are normal.  Musculoskeletal: Normal range of motion.  Neurological: He is alert and oriented to person, place, and time.  Skin: Skin is warm and dry.  Psychiatric: He has a normal mood and affect. His behavior is normal. Judgment and thought content normal.     EKG:  Was ordered and interpreted by me today. Shows normal sinus rhythm left bundle branch block 69 bpm.  No acute changes  EKG 08/08/2018: Sinus rhythm bundle branch block 75 bpm  Echocardiogram 03/24/2018: IMPRESSIONS    1. The left ventricle has severely reduced systolic function of 25-30%. The cavity size was mildly increased. There is no increased left ventricular wall thickness. Left ventricular diastology could not be evaluated due to nondiagnostic images. Left  ventricular diffuse hypokinesis.  2. Left atrial size was moderately dilated.  3. Not assessed.  4. The mitral valve was not assessed. There is mild thickening and mild calcification. Mitral valve regurgitation was not significant.  5. The tricuspid valve is not assessed. Tricuspid valve regurgitation was not assessed by color flow Doppler.  6. The aortic valve was not assessed.  7. The interatrial septum was not well visualized.  Recent Labs: 07/21/2018: Magnesium 2.0 08/06/2018: ALT 11; B Natriuretic Peptide 173.0 08/22/2018: BUN 42; Creatinine, Ser 1.67; Hemoglobin 13.2; Platelets 318; Potassium 5.0; Sodium 135; TSH 8.660  08/07/2018: Cholesterol 134; HDL 54; LDL Cholesterol 49; Total CHOL/HDL Ratio 2.5; Triglycerides 157; VLDL 31   CrCl cannot be calculated (Unknown ideal weight.).   Wt Readings from Last 3 Encounters:  08/10/18 (!) 412 lb 11.2 oz (187.2 kg)  07/23/18 (!) 408 lb 1.6 oz (185.1 kg)  07/14/18 (!) 393 lb (178.3 kg)     Other studies reviewed: Additional  studies/records reviewed today include: summarized above  ASSESSMENT AND PLAN:  1. Acute on chronic HFrEF secondary to presumed and ICM-his weight today is 395 pounds down from 412.7 pounds on 08/10/2018.  His creatinine has increased to 1.67 and his BUN has increased to 42.Estimated Creatinine Clearance: 77.8 mL/min (A) (by C-G formula based on SCr of 1.67 mg/dL (H)). - Decreased torsemide to 40 mg daily -Ordered BMP today and in 2 weeks Discontinue potassium- potassium 5.0 (08/29/2018) 2.  Persistent atrial fibrillation with RVR-normal sinus rhythm today, status post DCCV - Continue metoprolol succinate 25 mg tablet twice daily -Continue amiodarone 200 mg tablet daily 3.  Lower extremity venous stasis- patient states left lower leg accumulating odor - Continue lower extremity Kerlix wraps -Follow-up with PCP 4.  OSA- patient states he needs a new machine. -Follow-up with pulmonary/PCP  Disposition: F/u with Dr. Okey Dupre or an APP in 1 month.  Current medicines are reviewed at length with the patient today.  The patient did not have any concerns regarding medicines.  Signed, Thomasene Ripple. Cleaver 08/27/2018 10:59 AM     [facility] [address] [place], [place] [address] [phone]

## 2018-08-27 NOTE — Telephone Encounter (Signed)
Please let him know that his iron is looking a little better, but his thyroid is looking worse. Can we check to see if he's been taking his medicine daily? If he has, I'm going to increase his dose, if he hasn't, we'll recheck it in 1 month and see how he's doing, but I need him to take his thyroid medicine every day. Thanks!

## 2018-08-28 ENCOUNTER — Telehealth: Payer: Self-pay | Admitting: Internal Medicine

## 2018-08-28 ENCOUNTER — Telehealth: Payer: Self-pay

## 2018-08-28 DIAGNOSIS — I872 Venous insufficiency (chronic) (peripheral): Secondary | ICD-10-CM | POA: Diagnosis not present

## 2018-08-28 NOTE — Telephone Encounter (Signed)
Spoke with patient, he states that he has missed a couple of doses, informed him to try and make sure to take it everyday and come to have a repeat TSH in 1 month, lab appt scheduled.

## 2018-08-28 NOTE — Telephone Encounter (Signed)

## 2018-08-29 ENCOUNTER — Encounter: Payer: Self-pay | Admitting: Physician Assistant

## 2018-08-29 ENCOUNTER — Other Ambulatory Visit: Payer: Self-pay

## 2018-08-29 ENCOUNTER — Ambulatory Visit (INDEPENDENT_AMBULATORY_CARE_PROVIDER_SITE_OTHER): Payer: Medicaid Other | Admitting: General Practice

## 2018-08-29 VITALS — BP 130/80 | HR 69 | Ht 68.0 in | Wt 395.0 lb

## 2018-08-29 DIAGNOSIS — I4819 Other persistent atrial fibrillation: Secondary | ICD-10-CM | POA: Diagnosis not present

## 2018-08-29 DIAGNOSIS — G4733 Obstructive sleep apnea (adult) (pediatric): Secondary | ICD-10-CM | POA: Diagnosis not present

## 2018-08-29 DIAGNOSIS — I5023 Acute on chronic systolic (congestive) heart failure: Secondary | ICD-10-CM

## 2018-08-29 DIAGNOSIS — I878 Other specified disorders of veins: Secondary | ICD-10-CM | POA: Diagnosis not present

## 2018-08-29 DIAGNOSIS — Z79899 Other long term (current) drug therapy: Secondary | ICD-10-CM

## 2018-08-29 NOTE — Patient Instructions (Addendum)
Medication Instructions:  Take 40 mg of your Torsemide tomorrow morning   If you need a refill on your cardiac medications before your next appointment, please call your pharmacy.   Lab work: TODAY:BMET  FUTURE: BMET   If you have labs (blood work) drawn today and your tests are completely normal, you will receive your results only by: [name] MyChart Message (if you have MyChart) OR . A paper copy in the mail If you have any lab test that is abnormal or we need to change your treatment, we will call you to review the results.  Testing/Procedures: None  Follow-Up: Follow up with Dr.End in 1 month   Any Other Special Instructions Will Be Listed Below (If Applicable).

## 2018-08-31 DIAGNOSIS — I872 Venous insufficiency (chronic) (peripheral): Secondary | ICD-10-CM | POA: Diagnosis not present

## 2018-09-01 ENCOUNTER — Ambulatory Visit: Payer: Self-pay | Admitting: Licensed Clinical Social Worker

## 2018-09-01 ENCOUNTER — Other Ambulatory Visit: Payer: Self-pay

## 2018-09-01 ENCOUNTER — Telehealth: Payer: Self-pay

## 2018-09-01 DIAGNOSIS — F4321 Adjustment disorder with depressed mood: Secondary | ICD-10-CM

## 2018-09-01 NOTE — Chronic Care Management (AMB) (Signed)
Chronic Care Management    Clinical Social Work Follow Up Note  09/01/2018 Name: [name] [name] MRN: [id number] DOB: 10-Apr-1960  [name] [name] is a 58 y.o. year old male who is a primary care patient of Dorcas Carrow, DO. The CCM team was consulted for assistance with Grief Counseling and Caregiver Stress.   Review of patient status, including review of consultants reports, other relevant assessments, and collaboration with appropriate care team members and the patient's provider was performed as part of comprehensive patient evaluation and provision of chronic care management services.     Goals Addressed    . "I need help adjusting to loss of my spouse" (pt-stated)       Current Barriers:  . Financial constraints . Limited social support . ADL IADL limitations . Mental Health Concerns  . Social Isolation . Limited education about available grief support resources within the area* . Limited access to caregiver  Clinical Social Work Clinical Goal(s):  [name] Over the next 90 days, client will work with SW to address concerns related to grief (loss of spouse last month) . Over the next 90 days, patient will work with PCP  to address needs related to lack of care/support within the home and need for personal care services  Interventions: . Patient interviewed and appropriate assessments performed . Provided mental health counseling with regard to grief support and assessed suicidal ideations. Patient reported to [name] Pharmacist "that it would be easier to get shot with a bullet than to take care of his health and manage his medications" . Patient denies needing crisis intervention right now. Patient denies needing a wellness check at this time. He states that he does NOT wish to cause himself or anyone else harm but is dealing with chronic depression, chronic pain and grief all at once. Patient was provided Crisis Service Resource education and patient agreed to contact [name] number at [phone] if suicidal/homicidal ideations occur.  . Discussed plans with patient for ongoing care management follow up and provided patient with direct contact information for care management team . Collaborated with primary care provider and pharmacist re: Patient is requesting PCS services now that spouse has passed away as she was his primary caregiver. Patient has Medicaid. PCS application request made. LCSW completed call to [facility] and was informed that application was needed additional information (specifically NPI number) and that his application was faxed back to PCP office on 07/21/2018 but that they have not heard back. PCP will be updated. [name] Collaborated with [employer] (community agency) re: individual counseling referral for grief support. Patient is now agreeable to services. Referral made on 07/28/2018. UPDATE-Patient reports that he continues to gain grief counseling but feels that it is not helpful. LCSW provided other grief support resource education. Patient is agreeable to ongoing counseling with his counselor AJ at [facility]. . Assisted patient/caregiver with obtaining information about health plan benefits  Patient Self Care Activities:  . Attends all scheduled provider appointments . Calls provider office for new concerns or questions  Please see past updates related to this goal by clicking on the "Past Updates" button in the selected goal      . Patient Stated (pt-stated)       Current Barriers:  . Financial constraints . Limited social support . Level of care concerns . ADL IADL limitations . Mental Health Concerns  . Limited education about how to apply for disability* . Limited access to caregiver . Lacks  knowledge of community resource: PCS enrollment  Clinical Social Work Clinical Goal(s):  [name] Over the next 90 days, client will work with SW to address concerns related to gaining an aide through  [facility]  Interventions: . Patient interviewed and appropriate assessments performed . Provided mental health counseling with regard to patient's loss of spouse. Active and reflective listening provided.   . Provided patient with information about the disability enrollment process. . Discussed plans with patient for ongoing care management follow up and provided patient with direct contact information for care management team . Advised patient to keep his phone nearby as a caseworker from [facility]: Lambert Rhonda W will contact him to schedule assessment  . Collaborated with [facility] (community agency) re: voice message left with scheduling department [place] at [facility]) requesting a returned call to patient as he is wanting to schedule his initial PCS assessment.  . Assisted patient/caregiver with obtaining information about health plan benefits . Provided education and assistance to client regarding Advanced Directives. . Provided education to patient/caregiver regarding level of care options.  Patient Self Care Activities:  . Attends all scheduled provider appointments . Calls provider office for new concerns or questions  Initial goal documentation   Follow Up Plan: SW will follow up with patient by phone over the next month  [name], [place], MSW, [email or web address] Phone: [phone]

## 2018-09-04 ENCOUNTER — Telehealth: Payer: Self-pay | Admitting: Family Medicine

## 2018-09-04 DIAGNOSIS — R350 Frequency of micturition: Secondary | ICD-10-CM

## 2018-09-04 DIAGNOSIS — I872 Venous insufficiency (chronic) (peripheral): Secondary | ICD-10-CM | POA: Diagnosis not present

## 2018-09-04 NOTE — Telephone Encounter (Signed)
Relation to pt: self  Call back [phone]    Reason for call: Patient really didn't want to elaborate but patient requesting a referral to urology due his "manhood shrieking"  Patient did mention frequent urination but states its not related, he states the lasix is causing the urination frequency. Please advise preferable today as per patient request

## 2018-09-04 NOTE — Telephone Encounter (Signed)
Can we find out what he's needing- I'm happy to put in a referral for him today- but I don't know what "manhood shrieking" means

## 2018-09-04 NOTE — Telephone Encounter (Signed)
Called and spoke with patient, he states that he has had 3 hernia surgeries and every time he has it he has problems with his penis shrinking. He would like to be referred to Dr. Caroll Rancher at [place].

## 2018-09-04 NOTE — Telephone Encounter (Signed)
Referral generated

## 2018-09-05 ENCOUNTER — Ambulatory Visit: Payer: Self-pay | Admitting: Pharmacist

## 2018-09-05 DIAGNOSIS — I5022 Chronic systolic (congestive) heart failure: Secondary | ICD-10-CM

## 2018-09-05 NOTE — Chronic Care Management (AMB) (Signed)
  Chronic Care Management   Follow Up Note   09/05/2018 Name: [name] [name] MRN: [id number] DOB: [birth year]  Referred by: [name], DO Reason for referral : Chronic Care Management (Medication Management)   [name] [name] is a 58 y.o. year old male who is a primary care patient of [name], DO. The CCM team was consulted for assistance with chronic disease management and care coordination needs.    Contacted patient telephonically to discuss medication management  Review of patient status, including review of consultants reports, relevant laboratory and other test results, and collaboration with appropriate care team members and the patient's provider was performed as part of comprehensive patient evaluation and provision of chronic care management services.    Goals Addressed            This Visit's Progress     Patient Stated   . "I'm on a lot of medications" (pt-stated)       Current Barriers:  [name] Knowledge Deficits related to optimal medication management . Literacy Barriers - is able to spell out medication names to me and instructions over the phone, but reads slowly  . Medication Management - previously, provided patient a pill box to help organize medications. However, he notes that he has not used it. He associates pill boxes with negative connotations of his parents being sick and passing away, and he doesn't want to fill a pill box . Financial Barriers - Medicaid copays in [place] were less than in [place], patient is having a difficult time affording. Also has a difficult time with transportation. Notes that he is applying for Medicare disability.  . Patient correctly reiterated the medication changes made at cardiology appointment last week - torsemide continued at reduced 40 mg daily dose and potassium d/c d/t K of 5 . Patient notes he needs a refill on metoprolol and trazodone  Pharmacist Clinical Goal(s):  [name] Over the next 90 days, patient will work with  CCM team to address needs related to optimized medication management  Interventions: . Contacted [facility] - they will contact patient today to set up delivery and payment information. Also requested that they fill metoprolol and trazodone.  . Faxed updated medication list to [facility] for their comparison to what they have on file.   Patient Self Care Activities:  . Calls pharmacy for medication refills . Calls provider office for new concerns or questions  Please see past updates related to this goal by clicking on the "Past Updates" button in the selected goal         Plan:  - Will follow up with patient in 3-4 weeks for continued medication management support  [name], PharmD Clinical Pharmacist [place] [phone]

## 2018-09-05 NOTE — Patient Instructions (Signed)
Visit Information  Goals Addressed            This Visit's Progress     Patient Stated   . "I'm on a lot of medications" (pt-stated)       Current Barriers:  [name] Knowledge Deficits related to optimal medication management . Literacy Barriers - is able to spell out medication names to me and instructions over the phone, but reads slowly  . Medication Management - previously, provided patient a pill box to help organize medications. However, he notes that he has not used it. He associates pill boxes with negative connotations of his parents being sick and passing away, and he doesn't want to fill a pill box . Financial Barriers - Medicaid copays in [place] were less than in [place], patient is having a difficult time affording. Also has a difficult time with transportation. Notes that he is applying for Medicare disability.  . Patient correctly reiterated the medication changes made at cardiology appointment last week - torsemide continued at reduced 40 mg daily dose and potassium d/c d/t K of 5 . Patient notes he needs a refill on metoprolol and trazodone  Pharmacist Clinical Goal(s):  [name] Over the next 90 days, patient will work with CCM team to address needs related to optimized medication management  Interventions: . Contacted [facility] - they will contact patient today to set up delivery and payment information. Also requested that they fill metoprolol and trazodone.  . Faxed updated medication list to [facility] for their comparison to what they have on file.   Patient Self Care Activities:  . Calls pharmacy for medication refills . Calls provider office for new concerns or questions  Please see past updates related to this goal by clicking on the "Past Updates" button in the selected goal         The patient verbalized understanding of instructions provided today and declined a print copy of patient instruction materials.   Plan:  - Will follow up with patient in 3-4 weeks  for continued medication management support  [name], PharmD Clinical Pharmacist [place] [phone]

## 2018-09-07 ENCOUNTER — Ambulatory Visit: Payer: Self-pay | Admitting: Pharmacist

## 2018-09-07 DIAGNOSIS — I5022 Chronic systolic (congestive) heart failure: Secondary | ICD-10-CM

## 2018-09-07 NOTE — Patient Instructions (Signed)
Visit Information  Goals Addressed            This Visit's Progress     Patient Stated   . "I'm on a lot of medications" (pt-stated)       Current Barriers:  [name] Knowledge Deficits related to optimal medication management . Literacy Barriers - is able to spell out medication names to me and instructions over the phone, but reads slowly  . Medication Management - previously, provided patient a pill box to help organize medications. However, he notes that he has not used it. He associates pill boxes with negative connotations of his parents being sick and passing away, and he doesn't want to fill a pill box . Financial Barriers - Medicaid copays in [place] were less than in [place], patient is having a difficult time affording. Also has a difficult time with transportation. Notes that he is applying for Medicare disability.  . Patient called me today, noted that "[facility] doesn't accept Medicaid" and that he needed assistance.   Pharmacist Clinical Goal(s):  [name] Over the next 90 days, patient will work with CCM team to address needs related to optimized medication management  Interventions: . Contacted [facility] - they confirmed that they do accept Medicaid, and that they have Mr. Bohlman metoprolol and trazodone ready for him. The pharmacist attempted to get in touch with him, but he had not returned their call.  . Contacted patient, explained that he needed to contact the pharmacy to set up delivery. Provided him the number for [facility]. He verbalized understanding.   Patient Self Care Activities:  . Calls pharmacy for medication refills . Calls provider office for new concerns or questions  Please see past updates related to this goal by clicking on the "Past Updates" button in the selected goal         The patient verbalized understanding of instructions provided today and declined a print copy of patient instruction materials.    Plan:  - CCM team will outreach the  patient in the next 4-5 weeks for continued support  [name], PharmD Clinical Pharmacist [place] [phone]

## 2018-09-07 NOTE — Chronic Care Management (AMB) (Signed)
  Chronic Care Management   Follow Up Note   09/07/2018 Name: [name] [name] MRN: [id number] DOB: 14-Nov-1960  Referred by: [name], DO Reason for referral : Chronic Care Management (Medication Management)   [name] [name] is a 58 y.o. year old male who is a primary care patient of [name], DO. The CCM team was consulted for assistance with chronic disease management and care coordination needs.    Received call from patient today.   Review of patient status, including review of consultants reports, relevant laboratory and other test results, and collaboration with appropriate care team members and the patient's provider was performed as part of comprehensive patient evaluation and provision of chronic care management services.    Goals Addressed            This Visit's Progress     Patient Stated   . "I'm on a lot of medications" (pt-stated)       Current Barriers:  [name] [name] Knowledge Deficits related to optimal medication management . Literacy Barriers - is able to spell out medication names to me and instructions over the phone, but reads slowly  . Medication Management - previously, provided patient a pill box to help organize medications. However, he notes that he has not used it. He associates pill boxes with negative connotations of his parents being sick and passing away, and he doesn't want to fill a pill box . Financial Barriers - Medicaid copays in [place] were less than in [place], patient is having a difficult time affording. Also has a difficult time with transportation. Notes that he is applying for Medicare disability.  . Patient called me today, noted that "[facility] doesn't accept Medicaid" and that he needed assistance.   Pharmacist Clinical Goal(s):  [name] [name] Over the next 90 days, patient will work with CCM team to address needs related to optimized medication management  Interventions: . Contacted [facility] - they confirmed that they do accept  Medicaid, and that they have Mr. [name] [name] metoprolol and trazodone ready for him. The pharmacist attempted to get in touch with him, but he had not returned their call.  . Contacted patient, explained that he needed to contact the pharmacy to set up delivery. Provided him the number for [facility]. He verbalized understanding.   Patient Self Care Activities:  . Calls pharmacy for medication refills . Calls provider office for new concerns or questions  Please see past updates related to this goal by clicking on the "Past Updates" button in the selected goal         Plan:  - CCM team will outreach the patient in the next 4-5 weeks for continued support  [name], PharmD Clinical Pharmacist [place] [phone]

## 2018-09-08 ENCOUNTER — Other Ambulatory Visit: Payer: Self-pay | Admitting: Family Medicine

## 2018-09-08 ENCOUNTER — Ambulatory Visit: Payer: Self-pay | Admitting: Pharmacist

## 2018-09-08 DIAGNOSIS — I5022 Chronic systolic (congestive) heart failure: Secondary | ICD-10-CM

## 2018-09-08 DIAGNOSIS — I872 Venous insufficiency (chronic) (peripheral): Secondary | ICD-10-CM | POA: Diagnosis not present

## 2018-09-08 NOTE — Patient Instructions (Signed)
Visit Information  Goals Addressed            This Visit's Progress     Patient Stated   . "I'm on a lot of medications" (pt-stated)       Current Barriers:  [name] Knowledge Deficits related to optimal medication management . Literacy Barriers - is able to spell out medication names to me and instructions over the phone, but reads slowly  . Medication Management - previously, provided patient a pill box to help organize medications. However, he notes that he has not used it. He associates pill boxes with negative connotations of his parents being sick and passing away, and he doesn't want to fill a pill box . Financial Barriers - Medicaid copays in [place] were less than in [place], patient is having a difficult time affording. Also has a difficult time with transportation. Notes that he is applying for Medicare disability.  . Patient called me today, as he didn't remember what we had discussed yesterday. Asked that his medications be filled for 90 day supplies, as "I could afford that"  Pharmacist Clinical Goal(s):  [name] Over the next 90 days, patient will work with CCM team to address needs related to optimized medication management  Interventions: . Reiterated that he needed to call [facility] to set up delivery.  . Educated that [place] Medicaid only does 30 day supplies, and that it would not be cheaper to do 90 day supplies. Encouraged him to talk with [facility] pharmacists about any sort of payment plan they may be able to offer him. He verbalized reluctant understanding.   Patient Self Care Activities:  . Calls pharmacy for medication refills . Calls provider office for new concerns or questions  Please see past updates related to this goal by clicking on the "Past Updates" button in the selected goal         The patient verbalized understanding of instructions provided today and declined a print copy of patient instruction materials.   Plan:  - CCM team will continue to support  patient over the next 3-4 weeks  [name], PharmD Clinical Pharmacist [place] [phone]

## 2018-09-08 NOTE — Chronic Care Management (AMB) (Signed)
  Chronic Care Management   Follow Up Note   09/08/2018 Name: [name] [name] MRN: [id number] DOB: 1960-05-31  Referred by: [name], DO Reason for referral : Chronic Care Management (Medication Management)   Tyjon Bowen is a 58 y.o. year old male who is a primary care patient of [name], DO. The CCM team was consulted for assistance with chronic disease management and care coordination needs.    Patient contacted me today.   Review of patient status, including review of consultants reports, relevant laboratory and other test results, and collaboration with appropriate care team members and the patient's provider was performed as part of comprehensive patient evaluation and provision of chronic care management services.    Goals Addressed            This Visit's Progress     Patient Stated   . "I'm on a lot of medications" (pt-stated)       Current Barriers:  [name] Knowledge Deficits related to optimal medication management . Literacy Barriers - is able to spell out medication names to me and instructions over the phone, but reads slowly  . Medication Management - previously, provided patient a pill box to help organize medications. However, he notes that he has not used it. He associates pill boxes with negative connotations of his parents being sick and passing away, and he doesn't want to fill a pill box . Financial Barriers - Medicaid copays in [place] were less than in [place], patient is having a difficult time affording. Also has a difficult time with transportation. Notes that he is applying for Medicare disability.  . Patient called me today, as he didn't remember what we had discussed yesterday. Asked that his medications be filled for 90 day supplies, as "I could afford that"  Pharmacist Clinical Goal(s):  [name] Over the next 90 days, patient will work with CCM team to address needs related to optimized medication management  Interventions: . Reiterated that he  needed to call [facility] to set up delivery.  . Educated that [place] Medicaid only does 30 day supplies, and that it would not be cheaper to do 90 day supplies. Encouraged him to talk with [facility] pharmacists about any sort of payment plan they may be able to offer him. He verbalized reluctant understanding.   Patient Self Care Activities:  . Calls pharmacy for medication refills . Calls provider office for new concerns or questions  Please see past updates related to this goal by clicking on the "Past Updates" button in the selected goal          Plan:  - CCM team will continue to support patient over the next 3-4 weeks  [name], PharmD Clinical Pharmacist [place] [phone]

## 2018-09-08 NOTE — Telephone Encounter (Signed)
Requested Prescriptions  Pending Prescriptions Disp Refills  . atorvastatin (LIPITOR) 40 MG tablet [Pharmacy Med Name: ATORVASTATIN CALCIUM 40 MG TAB] 30 tablet 2    Sig: TAKE 1 TABLET BY MOUTH ONCE DAILY     Cardiovascular:  Antilipid - Statins Failed - 09/08/2018 12:43 PM      Failed - Triglycerides in normal range and within 360 days    Triglycerides  Date Value Ref Range Status  08/07/2018 157 (H) <150 mg/dL Final         Passed - Total Cholesterol in normal range and within 360 days    Cholesterol, Total  Date Value Ref Range Status  07/04/2018 156 100 - 199 mg/dL Final   Cholesterol  Date Value Ref Range Status  08/07/2018 134 0 - 200 mg/dL Final         Passed - LDL in normal range and within 360 days    LDL Calculated  Date Value Ref Range Status  07/04/2018 70 0 - 99 mg/dL Final   LDL Cholesterol  Date Value Ref Range Status  08/07/2018 49 0 - 99 mg/dL Final    Comment:           Total Cholesterol/HDL:CHD Risk Coronary Heart Disease Risk Table                     Men   Women  1/2 Average Risk   3.4   3.3  Average Risk       5.0   4.4  2 X Average Risk   9.6   7.1  3 X Average Risk  23.4   11.0        Use the calculated Patient Ratio above and the CHD Risk Table to determine the patient's CHD Risk.        ATP III CLASSIFICATION (LDL):  <100     mg/dL   Optimal  100-129  mg/dL   Near or Above                    Optimal  130-159  mg/dL   Borderline  160-189  mg/dL   High  >190     mg/dL   Very High Performed at [facility], [address] [place], [place] [address]          Passed - HDL in normal range and within 360 days    HDL  Date Value Ref Range Status  08/07/2018 54 >40 mg/dL Final  07/04/2018 63 >39 mg/dL Final         Passed - Patient is not pregnant      Passed - Valid encounter within last 12 months    Recent Outpatient Visits          2 weeks ago Anemia, unspecified type   [facility], Megan P, DO   1 month ago Chronic systolic heart failure [facility])   [place], Megan P, DO   2 months ago Chronic gout without tophus, unspecified cause, unspecified site   [facility], Megan P, DO   3 months ago Acute respiratory failure with hypoxia [facility])   [place], Barb Merino, DO      Future Appointments            In 4 days Wynetta Emery, Barb Merino, DO [place], [place]   In 1 month [place], Harrell Gave, MD [facility], [place]   In 1 month [place], Aaron Edelman  Loletha Grayer, [place]

## 2018-09-11 ENCOUNTER — Telehealth: Payer: Self-pay | Admitting: Pharmacy Technician

## 2018-09-11 NOTE — Telephone Encounter (Signed)
Patient has active Medicaid. [facility] is unable to provide services for Medicaid patients. If he were to lose benefits, he can reapply through the new patient process. Patient notified by letter.  Velda Shell CPht/Certification Specialist [facility]

## 2018-09-12 ENCOUNTER — Ambulatory Visit (INDEPENDENT_AMBULATORY_CARE_PROVIDER_SITE_OTHER): Payer: Medicaid Other | Admitting: Family Medicine

## 2018-09-12 ENCOUNTER — Ambulatory Visit: Payer: Self-pay | Admitting: Licensed Clinical Social Worker

## 2018-09-12 ENCOUNTER — Encounter: Payer: Self-pay | Admitting: Family Medicine

## 2018-09-12 ENCOUNTER — Other Ambulatory Visit: Payer: Self-pay

## 2018-09-12 VITALS — BP 154/79 | HR 70 | Temp 99.6°F | Ht 68.0 in | Wt >= 6400 oz

## 2018-09-12 DIAGNOSIS — D649 Anemia, unspecified: Secondary | ICD-10-CM

## 2018-09-12 DIAGNOSIS — M5441 Lumbago with sciatica, right side: Secondary | ICD-10-CM

## 2018-09-12 DIAGNOSIS — I872 Venous insufficiency (chronic) (peripheral): Secondary | ICD-10-CM | POA: Diagnosis not present

## 2018-09-12 DIAGNOSIS — F4321 Adjustment disorder with depressed mood: Secondary | ICD-10-CM

## 2018-09-12 DIAGNOSIS — F321 Major depressive disorder, single episode, moderate: Secondary | ICD-10-CM

## 2018-09-12 DIAGNOSIS — M5442 Lumbago with sciatica, left side: Secondary | ICD-10-CM

## 2018-09-12 DIAGNOSIS — G8929 Other chronic pain: Secondary | ICD-10-CM

## 2018-09-12 DIAGNOSIS — R7989 Other specified abnormal findings of blood chemistry: Secondary | ICD-10-CM | POA: Diagnosis not present

## 2018-09-12 DIAGNOSIS — N179 Acute kidney failure, unspecified: Secondary | ICD-10-CM | POA: Diagnosis not present

## 2018-09-12 MED ORDER — DULOXETINE HCL 40 MG PO CPEP: 40.0000 mg | ORAL_CAPSULE | Freq: Every day | ORAL | 3 refills | Status: DC

## 2018-09-12 NOTE — Chronic Care Management (AMB) (Signed)
  Care Management   Follow Up Note   09/12/2018 Name: [name] [name] MRN: [id number] DOB: May 13, 1960  Referred by: [name], DO Reason for referral : Care Coordination   [name] [name] is a 58 y.o. year old male who is a primary care patient of [name], DO. The care management team was consulted for assistance with care management and care coordination needs.    Review of patient status, including review of consultants reports, relevant laboratory and other test results, and collaboration with appropriate care team members and the patient's provider was performed as part of comprehensive patient evaluation and provision of chronic care management services.    LCSW received return call from [facility]. LCSW was informed that [facility] successfully spoke with patient but that he declined services and declined wanting to set up an assessment appointment. Therefore, patient's current PCS request case has been closed. LCSW confirmed with [facility] that a new PCS application must be submitted if he changes his mind and wishes to gain PCS. LCSW completed CCM outreach attempt today but was unable to reach patient successfully. A HIPPA compliant voice message was left encouraging patient to return call once available. LCSW rescheduled CCM SW appointment as well.   A HIPPA compliant phone message was left for the patient providing contact information and requesting a return call.   [name], BSW, MSW, [facility] Care Management [email or web address] Phone: [phone]

## 2018-09-12 NOTE — Chronic Care Management (AMB) (Signed)
Care Management   Follow Up Note   09/12/2018 Name: Nickita Beichler MRN: [id number] DOB: 1960/09/30  Referred by: Dorcas Carrow, DO Reason for referral : Care Coordination   Shadeed Boardwine is a 58 y.o. year old male who is a primary care patient of Dorcas Carrow, DO. The care management team was consulted for assistance with care management and care coordination needs.    Review of patient status, including review of consultants reports, relevant laboratory and other test results, and collaboration with appropriate care team members and the patient's provider was performed as part of comprehensive patient evaluation and provision of chronic care management services.    Goals Addressed    . "I need help adjusting to loss of my spouse" (pt-stated)       Current Barriers:  . Financial constraints . Limited social support . ADL IADL limitations . Mental Health Concerns  . Social Isolation . Limited education about available grief support resources within the area* . Limited access to caregiver  Clinical Social Work Clinical Goal(s):  [name] Over the next 90 days, client will work with SW to address concerns related to grief (loss of spouse last month) . Over the next 90 days, patient will work with PCP  to address needs related to lack of care/support within the home and need for personal care services  Interventions: . Patient interviewed and appropriate assessments performed . Provided mental health counseling with regard to grief support and assessed suicidal ideations. Patient reported to [facility] Pharmacist "that it would be easier to get shot with a bullet than to take care of his health and manage his medications" . Patient denies needing crisis intervention right now. Patient denies needing a wellness check at this time. He states that he does NOT wish to cause himself or anyone else harm but is dealing with chronic depression, chronic pain and grief all at once. Patient was  provided Crisis Service Resource education and patient agreed to contact [employer]/Mobile Crisis number at [phone] if suicidal/homicidal ideations occur.  . Discussed plans with patient for ongoing care management follow up and provided patient with direct contact information for care management team . UPDATE-09/12/18-Patient is requesting PCS services now that spouse has passed away as she was his primary caregiver. Patient has Medicaid. PCS application request made. LCSW completed call to [facility] but was unable to reach anyone. Voice message left encouraging a return call in order to gain update on PCS status.  [name] Collaborated with [employer] (community agency) re: individual counseling referral for grief support. Patient is now agreeable to services. Referral made on 07/28/2018. UPDATE-Patient reports that he continues to gain grief counseling but feels that it is not helpful. LCSW provided other grief support resource education. Patient is agreeable to ongoing counseling with his counselor AJ at [facility]. . Assisted patient/caregiver with obtaining information about health plan benefits  Patient Self Care Activities:  . Attends all scheduled provider appointments . Calls provider office for new concerns or questions  Please see past updates related to this goal by clicking on the "Past Updates" button in the selected goal      . I need financial assistance (pt-stated)       Current Barriers:  . Financial constraints . Limited social support . Level of care concerns . ADL IADL limitations . Mental Health Concerns  . Limited education about how to apply for disability* . Limited access to caregiver . Lacks knowledge of community resource:  PCS enrollment  Clinical Social Work Clinical Goal(s):  [name] Over the next 90 days, client will work with SW to address concerns related to gaining an aide through [facility]  Interventions: .  Patient interviewed and appropriate assessments performed . Provided mental health counseling with regard to patient's loss of spouse. Active and reflective listening provided.   . Provided patient with information about the disability enrollment process. . Discussed plans with patient for ongoing care management follow up and provided patient with direct contact information for care management team . Advised patient to keep his phone nearby as a caseworker from [facility] will contact him to schedule assessment  . Collaborated with [facility] (community agency) re: voice message left with scheduling department [place] [facility]) requesting a returned call to patient as he is wanting to schedule his initial PCS assessment.  . Assisted patient/caregiver with obtaining information about health plan benefits . Provided education and assistance to client regarding Advanced Directives. . Provided education to patient/caregiver regarding level of care options.  Patient Self Care Activities:  . Attends all scheduled provider appointments . Calls provider office for new concerns or questions  Initial goal documentation     A HIPPA compliant phone message was left for [facility], encouraging a return call to gain update on patient's PCS status.  [name], BSW, MSW, [facility] Care Management [email or web address] Phone: [phone]

## 2018-09-12 NOTE — Progress Notes (Signed)
BP (!) 154/79   Pulse 70   Temp 99.6 F (37.6 C) (Oral)   Ht 5\' 8"  (1.727 m)   Wt (!) 401 lb (181.9 kg)   SpO2 95%   BMI 60.97 kg/m    Subjective:    Patient ID: [name] [name], male    DOB: May 21, 1960, 58 y.o.   MRN: [id number]  HPI: [name] [name] is a 58 y.o. male  Chief Complaint  Patient presents with  . Depression  . Back Pain   DEPRESSION Mood status: stable Satisfied with current treatment?: no Symptom severity: moderate  Duration of current treatment : about a month Side effects: no Medication compliance: fair compliance Psychotherapy/counseling: no  Previous psychiatric medications: cymbalta Depressed mood: yes Anxious mood: yes Anhedonia: no Significant weight loss or gain: yes Insomnia: yes hard to stay asleep Fatigue: yes Feelings of worthlessness or guilt: no Impaired concentration/indecisiveness: no Suicidal ideations: no Hopelessness: no Crying spells: no Depression screen PHQ 2/9 09/12/2018  Decreased Interest 1  Down, Depressed, Hopeless 2  PHQ - 2 Score 3  Altered sleeping 2  Tired, decreased energy 3  Change in appetite 3  Feeling bad or failure about yourself  0  Trouble concentrating 0  Moving slowly or fidgety/restless 0  Suicidal thoughts 0  PHQ-9 Score 11  Difficult doing work/chores Somewhat difficult   HYPOTHYROIDISM Thyroid control status:uncontrolled Satisfied with current treatment? no Medication side effects: no Medication compliance: fair compliance Recent dose adjustment:yes Fatigue: yes Cold intolerance: no Heat intolerance: no Weight gain: no Weight loss: no Constipation: no Diarrhea/loose stools: no Palpitations: no Lower extremity edema: no Anxiety/depressed mood: yes   Relevant past medical, surgical, family and social history reviewed and updated as indicated. Interim medical history since our last visit reviewed. Allergies and medications reviewed and updated.  Review of Systems  Constitutional:  Positive for fatigue. Negative for activity change, appetite change, chills, diaphoresis, fever and unexpected weight change.  HENT: Negative.   Eyes: Negative.   Respiratory: Positive for chest tightness and shortness of breath. Negative for apnea, cough, choking, wheezing and stridor.   Cardiovascular: Positive for leg swelling. Negative for chest pain and palpitations.  Gastrointestinal: Negative.   Musculoskeletal: Positive for back pain, gait problem and myalgias. Negative for arthralgias, joint swelling, neck pain and neck stiffness.  Skin: Negative.   Psychiatric/Behavioral: Positive for dysphoric mood and sleep disturbance. Negative for agitation, behavioral problems, confusion, decreased concentration, hallucinations, self-injury and suicidal ideas. The patient is nervous/anxious. The patient is not hyperactive.     Per HPI unless specifically indicated above     Objective:    BP (!) 154/79   Pulse 70   Temp 99.6 F (37.6 C) (Oral)   Ht 5\' 8"  (1.727 m)   Wt (!) 401 lb (181.9 kg)   SpO2 95%   BMI 60.97 kg/m   Wt Readings from Last 3 Encounters:  09/12/18 (!) 401 lb (181.9 kg)  08/29/18 (!) 395 lb (179.2 kg)  08/10/18 (!) 412 lb 11.2 oz (187.2 kg)    Physical Exam Vitals signs and nursing note reviewed.  Constitutional:      General: He is not in acute distress.    Appearance: Normal appearance. He is obese. He is not ill-appearing, toxic-appearing or diaphoretic.  HENT:     Head: Normocephalic and atraumatic.     Right Ear: External ear normal.     Left Ear: External ear normal.     Nose: Nose normal.     Mouth/Throat:  Mouth: Mucous membranes are moist.     Pharynx: Oropharynx is clear.  Eyes:     General: No scleral icterus.       Right eye: No discharge.        Left eye: No discharge.     Extraocular Movements: Extraocular movements intact.     Conjunctiva/sclera: Conjunctivae normal.     Pupils: Pupils are equal, round, and reactive to light.  Neck:      Musculoskeletal: Normal range of motion and neck supple.  Cardiovascular:     Rate and Rhythm: Normal rate and regular rhythm.     Pulses: Normal pulses.     Heart sounds: Normal heart sounds. No murmur. No friction rub. No gallop.   Pulmonary:     Effort: Pulmonary effort is normal. No respiratory distress.     Breath sounds: Normal breath sounds. No stridor. No wheezing, rhonchi or rales.  Chest:     Chest wall: No tenderness.  Musculoskeletal: Normal range of motion.     Right lower leg: Edema present.     Left lower leg: Edema present.  Skin:    General: Skin is warm and dry.     Capillary Refill: Capillary refill takes less than 2 seconds.     Coloration: Skin is not jaundiced or pale.     Findings: No bruising, erythema, lesion or rash.  Neurological:     General: No focal deficit present.     Mental Status: He is alert and oriented to person, place, and time. Mental status is at baseline.  Psychiatric:        Mood and Affect: Mood normal.        Behavior: Behavior normal.        Thought Content: Thought content normal.        Judgment: Judgment normal.     Results for orders placed or performed in visit on 09/12/18  TSH  Result Value Ref Range   TSH 8.090 (H) 0.450 - 4.500 uIU/mL  CBC with Differential/Platelet  Result Value Ref Range   WBC 6.5 3.4 - 10.8 x10E3/uL   RBC 4.49 4.14 - 5.80 x10E6/uL   Hemoglobin 13.0 13.0 - 17.7 g/dL   Hematocrit 44.0 10.2 - 51.0 %   MCV 89 79 - 97 fL   MCH 29.0 26.6 - 33.0 pg   MCHC 32.4 31.5 - 35.7 g/dL   RDW 72.5 (H) 36.6 - 44.0 %   Platelets 200 150 - 450 x10E3/uL   Neutrophils 68 Not Estab. %   Lymphs 18 Not Estab. %   Monocytes 9 Not Estab. %   Eos 5 Not Estab. %   Basos 0 Not Estab. %   Neutrophils Absolute 4.4 1.4 - 7.0 x10E3/uL   Lymphocytes Absolute 1.2 0.7 - 3.1 x10E3/uL   Monocytes Absolute 0.6 0.1 - 0.9 x10E3/uL   EOS (ABSOLUTE) 0.3 0.0 - 0.4 x10E3/uL   Basophils Absolute 0.0 0.0 - 0.2 x10E3/uL   Immature  Granulocytes 0 Not Estab. %   Immature Grans (Abs) 0.0 0.0 - 0.1 x10E3/uL  Comprehensive metabolic panel  Result Value Ref Range   Glucose 112 (H) 65 - 99 mg/dL   BUN 15 6 - 24 mg/dL   Creatinine, Ser 3.47 0.76 - 1.27 mg/dL   GFR calc non Af Amer 62 >59 mL/min/1.73   GFR calc Af Amer 72 >59 mL/min/1.73   BUN/Creatinine Ratio 12 9 - 20   Sodium 135 134 - 144 mmol/L   Potassium 4.7 3.5 -  5.2 mmol/L   Chloride 94 (L) 96 - 106 mmol/L   CO2 23 20 - 29 mmol/L   Calcium 9.8 8.7 - 10.2 mg/dL   Total Protein 7.3 6.0 - 8.5 g/dL   Albumin 4.2 3.8 - 4.9 g/dL   Globulin, Total 3.1 1.5 - 4.5 g/dL   Albumin/Globulin Ratio 1.4 1.2 - 2.2   Bilirubin Total 0.5 0.0 - 1.2 mg/dL   Alkaline Phosphatase 119 (H) 39 - 117 IU/L   AST 17 0 - 40 IU/L   ALT 11 0 - 44 IU/L      Assessment & Plan:   Problem List Items Addressed This Visit      Nervous and Auditory   Chronic bilateral low back pain with bilateral sciatica    Too large for the tables at [facility] pain clinic. They will not do any opiate treatment due to OSA and obesity. We will try to get him into see another pain management doctor- [place] [facility] as [place] another [place]. Will continue his cymbalta and see how it does. Continue to monitor.       Relevant Medications   DULoxetine HCl 40 MG CPEP     Other   Anemia    Rechecking levels today. Will treat as needed. Await results.       Relevant Orders   CBC with Differential/Platelet (Completed)   Abnormal thyroid blood test - Primary    Rechecking levels today. Await results. Call with any concerns.       Relevant Orders   TSH (Completed)   Depression, major, single episode, moderate (HCC)    Still not doing well. Will increase his cymbalta and recheck 1 month. Call with any concerns.       Relevant Medications   DULoxetine HCl 40 MG CPEP    Other Visit Diagnoses    AKI (acute kidney injury) (HCC)       Will  recheck labs today. Await results.    Relevant Orders   Comprehensive metabolic panel (Completed)       Follow up plan: Return in about 4 weeks (around 10/10/2018).

## 2018-09-13 LAB — CBC WITH DIFFERENTIAL/PLATELET
Basophils Absolute: 0 10*3/uL (ref 0.0–0.2)
Basos: 0 %
EOS (ABSOLUTE): 0.3 10*3/uL (ref 0.0–0.4)
Eos: 5 %
Hematocrit: 40.1 % (ref 37.5–51.0)
Hemoglobin: 13 g/dL (ref 13.0–17.7)
Immature Grans (Abs): 0 10*3/uL (ref 0.0–0.1)
Immature Granulocytes: 0 %
Lymphocytes Absolute: 1.2 10*3/uL (ref 0.7–3.1)
Lymphs: 18 %
MCH: 29 pg (ref 26.6–33.0)
MCHC: 32.4 g/dL (ref 31.5–35.7)
MCV: 89 fL (ref 79–97)
Monocytes Absolute: 0.6 10*3/uL (ref 0.1–0.9)
Monocytes: 9 %
Neutrophils Absolute: 4.4 10*3/uL (ref 1.4–7.0)
Neutrophils: 68 %
Platelets: 200 10*3/uL (ref 150–450)
RBC: 4.49 x10E6/uL (ref 4.14–5.80)
RDW: 20.5 % — ABNORMAL HIGH (ref 11.6–15.4)
WBC: 6.5 10*3/uL (ref 3.4–10.8)

## 2018-09-13 LAB — COMPREHENSIVE METABOLIC PANEL
ALT: 11 IU/L (ref 0–44)
AST: 17 IU/L (ref 0–40)
Albumin/Globulin Ratio: 1.4 (ref 1.2–2.2)
Albumin: 4.2 g/dL (ref 3.8–4.9)
Alkaline Phosphatase: 119 IU/L — ABNORMAL HIGH (ref 39–117)
BUN/Creatinine Ratio: 12 (ref 9–20)
BUN: 15 mg/dL (ref 6–24)
Bilirubin Total: 0.5 mg/dL (ref 0.0–1.2)
CO2: 23 mmol/L (ref 20–29)
Calcium: 9.8 mg/dL (ref 8.7–10.2)
Chloride: 94 mmol/L — ABNORMAL LOW (ref 96–106)
Creatinine, Ser: 1.26 mg/dL (ref 0.76–1.27)
GFR calc Af Amer: 72 mL/min/{1.73_m2} (ref >59)
GFR calc non Af Amer: 62 mL/min/{1.73_m2} (ref >59)
Globulin, Total: 3.1 g/dL (ref 1.5–4.5)
Glucose: 112 mg/dL — ABNORMAL HIGH (ref 65–99)
Potassium: 4.7 mmol/L (ref 3.5–5.2)
Sodium: 135 mmol/L (ref 134–144)
Total Protein: 7.3 g/dL (ref 6.0–8.5)

## 2018-09-13 LAB — TSH: TSH: 8.09 u[IU]/mL — ABNORMAL HIGH (ref 0.450–4.500)

## 2018-09-15 DIAGNOSIS — I872 Venous insufficiency (chronic) (peripheral): Secondary | ICD-10-CM | POA: Diagnosis not present

## 2018-09-17 ENCOUNTER — Encounter: Payer: Self-pay | Admitting: Family Medicine

## 2018-09-17 ENCOUNTER — Telehealth: Payer: Self-pay | Admitting: Family Medicine

## 2018-09-17 DIAGNOSIS — R7989 Other specified abnormal findings of blood chemistry: Secondary | ICD-10-CM

## 2018-09-17 DIAGNOSIS — F321 Major depressive disorder, single episode, moderate: Secondary | ICD-10-CM | POA: Insufficient documentation

## 2018-09-17 DIAGNOSIS — F3341 Major depressive disorder, recurrent, in partial remission: Secondary | ICD-10-CM | POA: Insufficient documentation

## 2018-09-17 NOTE — Assessment & Plan Note (Signed)
Rechecking levels today. Will treat as needed. Await results.  

## 2018-09-17 NOTE — Assessment & Plan Note (Signed)
Too large for the tables at [facility] pain clinic. They will not do any opiate treatment due to OSA and obesity. We will try to get him into see another pain management doctor- will need transportation to get to [facility] as there is not another pain management office in [facility]. Will continue his cymbalta and see how it does. Continue to monitor.

## 2018-09-17 NOTE — Assessment & Plan Note (Signed)
Still not doing well. Will increase his cymbalta and recheck 1 month. Call with any concerns.

## 2018-09-17 NOTE — Telephone Encounter (Signed)
Please let him know that his labs look nice and normal except his thyroid is still under treated- please confirm that he has been taking his thyroid medicine every day. If he has not- he should take it daily for a month and then we'll recheck it. If he has, let me know and I'll adjust his medicine. Thanks!

## 2018-09-17 NOTE — Assessment & Plan Note (Signed)
Rechecking levels today. Await results. Call with any concerns.  

## 2018-09-18 ENCOUNTER — Ambulatory Visit: Payer: Self-pay | Admitting: Pharmacist

## 2018-09-18 DIAGNOSIS — R7989 Other specified abnormal findings of blood chemistry: Secondary | ICD-10-CM

## 2018-09-18 NOTE — Patient Instructions (Signed)
Visit Information  Goals Addressed            This Visit's Progress     Patient Stated   . "I'm on a lot of medications" (pt-stated)       Current Barriers:  [name] Knowledge Deficits related to optimal medication management o Literacy Barriers - is able to spell out medication names to me and instructions over the phone, but reads slowly  o Medication Management - previously, provided patient a pill box to help organize medications. However, he notes that he has not used it. He associates pill boxes with negative connotations of his parents being sick and passing away, and he doesn't want to fill a pill box o Financial Barriers - Medicaid copays in [place] were less than in [place], patient is having a difficult time affording. Also has a difficult time with transportation. Notes that he is applying for Medicare disability.  . Today, notes that he had medications delivered by [facility]. He notes he has all of his medications currently . Notes that he may have to cancel upcoming cardiology appointment because his tags have expired and he doesn't have the money to update currently.   Pharmacist Clinical Goal(s):  [name] Over the next 90 days, patient will work with CCM team to address needs related to optimized medication management  Interventions: . Reminded to contact [facility] for refills in the future . Reviewed lab results. Patient confirms that he takes levothyroxine 25 mcg daily. Will alert Dr. Wynetta Emery so that an increased dose can be sent to [facility] . Will collaborate with [name], LCSW/C3 team for transportation support   Patient Self Care Activities:  . Calls pharmacy for medication refills . Calls provider office for new concerns or questions  Please see past updates related to this goal by clicking on the "Past Updates" button in the selected goal         The patient verbalized understanding of instructions provided today and declined a print copy of patient  instruction materials.   Plan:  - Will outreach patient in 4-5 weeks for continued medication management support  [name], PharmD Clinical Pharmacist [place] [phone]

## 2018-09-18 NOTE — Telephone Encounter (Signed)
Spoke with patient. He confirms that he has been taking levothyroxine 25 mcg daily.

## 2018-09-18 NOTE — Chronic Care Management (AMB) (Signed)
  Chronic Care Management   Follow Up Note   09/18/2018 Name: [name] [name] MRN: [id number] DOB: March 23, 1960  Referred by: [name], DO Reason for referral : Chronic Care Management (Medication Management)   [name] [name] is a 58 y.o. year old male who is a primary care patient of [name], DO. The CCM team was consulted for assistance with chronic disease management and care coordination needs.    Contacted patient telephonically for medication management support. He notes today is a tough day with grief for his loved one, however, he does seem more energetic than previously.   Review of patient status, including review of consultants reports, relevant laboratory and other test results, and collaboration with appropriate care team members and the patient's provider was performed as part of comprehensive patient evaluation and provision of chronic care management services.    Goals Addressed            This Visit's Progress     Patient Stated   . "I'm on a lot of medications" (pt-stated)       Current Barriers:  [name] Knowledge Deficits related to optimal medication management o Literacy Barriers - is able to spell out medication names to me and instructions over the phone, but reads slowly  o Medication Management - previously, provided patient a pill box to help organize medications. However, he notes that he has not used it. He associates pill boxes with negative connotations of his parents being sick and passing away, and he doesn't want to fill a pill box o Financial Barriers - Medicaid copays in [place] were less than in [place], patient is having a difficult time affording. Also has a difficult time with transportation. Notes that he is applying for Medicare disability.  . Today, notes that he had medications delivered by [facility]. He notes he has all of his medications currently . Notes that he may have to cancel upcoming cardiology appointment because his tags  have expired and he doesn't have the money to update currently.   Pharmacist Clinical Goal(s):  [name] Over the next 90 days, patient will work with CCM team to address needs related to optimized medication management  Interventions: . Reminded to contact [facility] for refills in the future . Reviewed lab results. Patient confirms that he takes levothyroxine 25 mcg daily. Will alert Dr. Wynetta Emery so that an increased dose can be sent to [facility] . Will collaborate with [name], LCSW/C3 team for transportation support   Patient Self Care Activities:  . Calls pharmacy for medication refills . Calls provider office for new concerns or questions  Please see past updates related to this goal by clicking on the "Past Updates" button in the selected goal          Plan:  - Will outreach patient in 4-5 weeks for continued medication management support  [name], PharmD Clinical Pharmacist [place] [phone]

## 2018-09-18 NOTE — Telephone Encounter (Signed)
Called and left a message asking patient to return my call.  

## 2018-09-19 ENCOUNTER — Telehealth: Payer: Self-pay

## 2018-09-19 MED ORDER — LEVOTHYROXINE SODIUM 50 MCG PO TABS: 50.0000 ug | ORAL_TABLET | Freq: Every day | ORAL | 1 refills | Status: DC

## 2018-09-19 NOTE — Telephone Encounter (Signed)
Please have him increase to 42mcg daily and we'll recheck in 6 weeks. Rx sent to his pharmacy and order placed. Thanks!

## 2018-09-19 NOTE — Telephone Encounter (Signed)
Patient notified. Will increase 25 mg to 2 a day until he is out and then start the 50 mcg. Asked for me to call pharmacy and ask them to not deliver medication until 10/17/18.

## 2018-09-19 NOTE — Addendum Note (Signed)
Addended by: [name] on: 09/19/2018 03:10 PM   Modules accepted: Orders

## 2018-09-20 DIAGNOSIS — I872 Venous insufficiency (chronic) (peripheral): Secondary | ICD-10-CM | POA: Diagnosis not present

## 2018-09-21 ENCOUNTER — Telehealth: Payer: Self-pay | Admitting: Family Medicine

## 2018-09-21 NOTE — Telephone Encounter (Signed)
Closing referral    2nd called to pt regarding Education officer, environmental. Pt stated that his appt scheduled for 8/28 he will need to reschedule as the license tags on his car are out of state and also out of date and need to be renewed, he also said he needed to get an [place] drivers license. He asked for recommendations for places nearby that would do inspections as the places he had called were closed. I did a Producer, television/film/video and gave him the name/# of the closest auto mechanics near him with 4 or more stars. He will call Dr. Saunders Revel to resched his 8/28 appt. Gave him the # for DSS to let him know that he is eligible for free transportation and if he needed help getting that set up I could help, he indicated that he did not need help and that he would call to be added to the system.  Mr. Gillie mentioned his conversation with [place] at [facility] and that he did not cancel or decline home health services. Notifying Social work of my conversation with patient for future reference.   [place]  ??[email or web address]  ?? [phone]   Skype   .

## 2018-09-22 NOTE — Telephone Encounter (Signed)
closing

## 2018-09-25 DIAGNOSIS — I872 Venous insufficiency (chronic) (peripheral): Secondary | ICD-10-CM | POA: Diagnosis not present

## 2018-09-27 ENCOUNTER — Other Ambulatory Visit: Payer: Self-pay

## 2018-09-27 ENCOUNTER — Ambulatory Visit: Payer: Self-pay | Admitting: Licensed Clinical Social Worker

## 2018-09-27 ENCOUNTER — Telehealth: Payer: Self-pay | Admitting: Family Medicine

## 2018-09-27 DIAGNOSIS — F4321 Adjustment disorder with depressed mood: Secondary | ICD-10-CM

## 2018-09-27 NOTE — Telephone Encounter (Signed)
Per review of social worker's notes, [name] [name] reached out to patient and reported that he declined services and is requesting a new referral to be placed due to insurance requirements if he's still wanting services. Please let patient know that Dr. Wynetta Emery will be back in next week and will review this  Copied from [place] [phone]. Topic: Referral - Status >> Sep 27, 2018 10:48 AM [name] [name] wrote: Reason for CRM:   Pt states at last OV he requested a referral to [name].  Pt states that his social worker is telling him that still hasn't been done. Pt wants to know where this stands.

## 2018-09-27 NOTE — Telephone Encounter (Signed)
Called patient. Left VM for patient to return call to the office. 

## 2018-09-27 NOTE — Chronic Care Management (AMB) (Signed)
  Chronic Care Management    Clinical Social Work Follow Up Note  09/27/2018 Name: [name] [name] MRN: [id number] DOB: August 09, 1960  [name] [name] is a 58 y.o. year old male who is a primary care patient of [name], DO. The CCM team was consulted for assistance with [name] .   Review of patient status, including review of consultants reports, other relevant assessments, and collaboration with appropriate care team members and the patient's provider was performed as part of comprehensive patient evaluation and provision of chronic care management services.     Goals Addressed    . "I need help adjusting to loss of my spouse" (pt-stated)       Current Barriers:  . Financial constraints . Limited social support . ADL IADL limitations . Mental Health Concerns  . Social Isolation . Limited education about available grief support resources within the area* . Limited access to caregiver  Clinical Social Work Clinical Goal(s):  [name] Over the next 90 days, client will work with SW to address concerns related to grief (loss of spouse last month) . Over the next 90 days, patient will work with PCP  to address needs related to lack of care/support within the home and need for personal care services  Interventions: . Patient interviewed and appropriate assessments performed . Provided mental health counseling with regard to grief support and assessed suicidal ideations. Patient was provided Crisis Service Resource education and patient agreed to contact [employer]/Mobile Crisis number at [phone] if suicidal/homicidal ideations occur.  . Discussed plans with patient for ongoing care management follow up and provided patient with direct contact information for care management team . Patient is requesting PCS services now that spouse has passed away as she was his primary caregiver. Patient has Medicaid. PCS application request was made in the past  ut patient ended up denying services on accident. UPDATE-LCSW was made aware that [facility] contacted patient to schedule initial assessment and he declined services. A new PCS application must be submitted if he wishes to gain aide through Medicaid. PCP updated. Patient reports that he has considered ALF placement because he had another fall this morning. However, once patient was educated on the payment process ([employer]), he has decided to hold off on ALF placement and pursue personal care services through [facility]. [name] Collaborated with [employer] (community agency) re: individual counseling referral for grief support. Patient is now agreeable to services. Referral made on 07/28/2018. UPDATE-Patient reports that he continues to gain grief counseling but feels that it is not helpful. LCSW provided other grief support resource education. Patient is agreeable to ongoing counseling with his counselor AJ at [facility]. . Assisted patient/caregiver with obtaining information about health plan benefits  Patient Self Care Activities:  . Attends all scheduled provider appointments . Calls provider office for new concerns or questions  Please see past updates related to this goal by clicking on the "Past Updates" button in the selected goal      Follow Up Plan: SW will follow up with patient by phone over the next month  [name], Van, MSW, [email or web address] Phone: [phone]

## 2018-09-28 DIAGNOSIS — I872 Venous insufficiency (chronic) (peripheral): Secondary | ICD-10-CM | POA: Diagnosis not present

## 2018-09-28 NOTE — Telephone Encounter (Signed)
Called and spoke to patient. Message relayed. Patient stated that he never received any call nor declined anything. Patient stated that he needs a new referral as soon as possible. Please advise

## 2018-09-28 NOTE — Telephone Encounter (Signed)
After hearing conversation with Rodena Piety, tried calling patient back x's 3 to remind him of [facility] or the ER if patient's grief is too much. LVM for patient to return call.

## 2018-09-29 NOTE — Telephone Encounter (Signed)
As discussed initially, this will have to wait for PCP review. Routing to PCP

## 2018-10-02 ENCOUNTER — Other Ambulatory Visit: Payer: Self-pay

## 2018-10-02 DIAGNOSIS — I872 Venous insufficiency (chronic) (peripheral): Secondary | ICD-10-CM | POA: Diagnosis not present

## 2018-10-02 NOTE — Telephone Encounter (Signed)
Please refax form in the media tab- if need a new form, please copy it over and I'll sign it.

## 2018-10-02 NOTE — Telephone Encounter (Signed)
Form was faxed on Friday

## 2018-10-04 ENCOUNTER — Telehealth: Payer: Self-pay

## 2018-10-05 DIAGNOSIS — I872 Venous insufficiency (chronic) (peripheral): Secondary | ICD-10-CM | POA: Diagnosis not present

## 2018-10-09 DIAGNOSIS — I872 Venous insufficiency (chronic) (peripheral): Secondary | ICD-10-CM | POA: Diagnosis not present

## 2018-10-12 DIAGNOSIS — I872 Venous insufficiency (chronic) (peripheral): Secondary | ICD-10-CM | POA: Diagnosis not present

## 2018-10-13 ENCOUNTER — Ambulatory Visit: Payer: Medicaid Other | Admitting: Internal Medicine

## 2018-10-16 ENCOUNTER — Ambulatory Visit: Payer: Medicaid Other | Admitting: Urology

## 2018-10-16 DIAGNOSIS — I872 Venous insufficiency (chronic) (peripheral): Secondary | ICD-10-CM | POA: Diagnosis not present

## 2018-10-17 ENCOUNTER — Ambulatory Visit: Payer: Medicaid Other | Admitting: Pharmacist

## 2018-10-17 ENCOUNTER — Other Ambulatory Visit: Payer: Self-pay

## 2018-10-17 ENCOUNTER — Ambulatory Visit: Payer: Self-pay | Admitting: *Deleted

## 2018-10-17 ENCOUNTER — Ambulatory Visit (INDEPENDENT_AMBULATORY_CARE_PROVIDER_SITE_OTHER): Payer: Medicaid Other | Admitting: Family Medicine

## 2018-10-17 ENCOUNTER — Encounter: Payer: Self-pay | Admitting: Family Medicine

## 2018-10-17 VITALS — BP 115/76 | HR 66 | Temp 98.8°F | Ht 68.0 in

## 2018-10-17 DIAGNOSIS — F321 Major depressive disorder, single episode, moderate: Secondary | ICD-10-CM

## 2018-10-17 DIAGNOSIS — R7989 Other specified abnormal findings of blood chemistry: Secondary | ICD-10-CM

## 2018-10-17 DIAGNOSIS — E119 Type 2 diabetes mellitus without complications: Secondary | ICD-10-CM

## 2018-10-17 DIAGNOSIS — G4733 Obstructive sleep apnea (adult) (pediatric): Secondary | ICD-10-CM | POA: Diagnosis not present

## 2018-10-17 DIAGNOSIS — I5022 Chronic systolic (congestive) heart failure: Secondary | ICD-10-CM

## 2018-10-17 DIAGNOSIS — J410 Simple chronic bronchitis: Secondary | ICD-10-CM

## 2018-10-17 LAB — BAYER DCA HB A1C WAIVED: HB A1C (BAYER DCA - WAIVED): 5.7 % (ref <7.0)

## 2018-10-17 NOTE — Assessment & Plan Note (Addendum)
Due for recheck on his thyroid in 2 weeks, however he has only been taking his thyroid meds 2x a week. Strongly encouraged patient to take his medicine DAILY and we will recheck labs at visit in 6 weeks.

## 2018-10-17 NOTE — Assessment & Plan Note (Signed)
Needs new CPAP. No record of previous sleep study- done in [place]. Patient requests referral to pulmonology. Referral to pulmonology and sleep medicine made today.

## 2018-10-17 NOTE — Assessment & Plan Note (Signed)
Under good control with A1c of 5.7- continue diet and exercise. Continue to monitor.

## 2018-10-17 NOTE — Patient Instructions (Addendum)
Mr. Chevalier,   It was great to see you today!  Heart/Blood Pressure/Reducing Risk of Heart Attacks and Stroke:  -Amiodarone 200 mg every morning -Atorvastatin 40 mg every morning -Lisinopril 5 mg every morning -Metoprolol 25 mg every morning AND evening -Spironolactone 25 mg every morning -Torsemide 40 mg every morning  Make sure you see Dr. Saunders Revel (cardiology) on Wednesday, September 16th.   The duloxetine medication can help with your mood and pain. Pick it up from [facility].   The number for Social Security is [phone]. The local office address is [address] in [place]; their number is [phone] Visit Information  Goals Addressed            This Visit's Progress     Patient Stated   . PharmD "I'm in pain and depressed" (pt-stated)       Current Barriers:  . Patient endorses significant depression, especially since the passing of his significant other in April, complicated by chronic pain.  . Saw Dr. Holley Raring in pain clinic, but notes that he wasn't interested in the interventions recommended . He notes that he is taking duloxetine and doesn't believe it will provide any benefit for his mood; however, refill history suggests he may not be taking this  Pharmacist Clinical Goal(s):  [name] Over the next 90 days, patient will work with CCM team and primary care provider to address needs related to management of chronic pain and depression  Interventions: . Encouraged patient to give duloxetine more of a chance to determine full benefit. Will continue to collaborate with [name], LCSW for mood support.   Patient Self Care Activities:  . Self administers medications as prescribed  Please see past updates related to this goal by clicking on the "Past Updates" button in the selected goal      . PharmD "I'm on a lot of medications" (pt-stated)       Current Barriers:  [name] Knowledge Deficits related to optimal medication management o Literacy Barriers - is  able to spell out medication names to me and instructions over the phone, but reads slowly  o Medication Management - considering pill packing in the future; however, patient notes he can't afford to fill all of his medications at once.  o Financial Barriers - Continues to report that he's having a hard time affording medications, food, etc since he received more aid/had lower copays in [place]. Patient notes he called and left a message with his social security case worker. [name] Appointment with Dr. Saunders Revel, cardiology, on 11/01/2018. Patient notes he continues to self-adjust and self-stop medications to save money. Previously recommended that he ask [facility] for payment programs or if they could waive copayments, but patient declined to ask these questions.   Pharmacist Clinical Goal(s):  [name] Over the next 90 days, patient will work with CCM team to address needs related to optimized medication management  Interventions: . Discussed pill packing with [facility] again. Patient declines, noting that he can handle managing his medications. Will continue to try to reinforce medication adherence and making medication management a priority  . Collaborated with [name], LCSW. Provided patient direct number for Social Security office in [place] to contact to follow up on disability application.    Patient Self Care Activities:  . Calls pharmacy for medication refills . Calls provider office for new concerns or questions  Please see past updates related to this goal by clicking on the "Past Updates" button in the  selected goal         Print copy of patient instructions provided.  Plan:  - CCM team will outreach patient in the next 3-5 weeks for continued support  Catie Feliz Beam, PharmD Clinical Pharmacist [facility] [phone]

## 2018-10-17 NOTE — Chronic Care Management (AMB) (Signed)
Chronic Care Management   Follow Up Note   10/17/2018 Name: [name] [name] MRN: [id number] DOB: 06-03-1960  Referred by: [name], DO Reason for referral : Chronic Care Management (Medication Management)   [name] [name] is a 58 y.o. year old male who is a primary care patient of [name], DO. The CCM team was consulted for assistance with chronic disease management and care coordination needs.    Met with patient face to face during primary care provider appointment with RN CM Janci Minor.   Review of patient status, including review of consultants reports, relevant laboratory and other test results, and collaboration with appropriate care team members and the patient's provider was performed as part of comprehensive patient evaluation and provision of chronic care management services.    SDOH (Social Determinants of Health) screening performed today: Financial Strain . See Care Plan for related entries.   Outpatient Encounter Medications as of 10/17/2018  Medication Sig  . amiodarone (PACERONE) 200 MG tablet Take 1 tablet (200 mg total) by mouth daily.  [name] atorvastatin (LIPITOR) 40 MG tablet TAKE 1 TABLET BY MOUTH ONCE DAILY  . lisinopril (ZESTRIL) 5 MG tablet Take 1 tablet (5 mg total) by mouth daily.  . metoprolol succinate (TOPROL-XL) 25 MG 24 hr tablet Take 1 tablet (25 mg total) by mouth 2 (two) times daily.  . rivaroxaban (XARELTO) 20 MG TABS tablet Take 1 tablet (20 mg total) by mouth daily with supper.  [name] spironolactone (ALDACTONE) 25 MG tablet Take 1 tablet (25 mg total) by mouth daily.  [name] albuterol (VENTOLIN HFA) 108 (90 Base) MCG/ACT inhaler Inhale 2 puffs into the lungs every 6 (six) hours as needed for wheezing or shortness of breath.  . allopurinol (ZYLOPRIM) 100 MG tablet Take 1 tablet (100 mg total) by mouth daily. (Patient not taking: Reported on 10/17/2018)  . budesonide-formoterol (SYMBICORT) 160-4.5 MCG/ACT inhaler Inhale 2 puffs into the lungs 2 (two)  times daily. (Patient not taking: Reported on 10/17/2018)  . Colchicine (MITIGARE) 0.6 MG CAPS Take 1 tablet by mouth every other day.  . DULoxetine HCl 40 MG CPEP Take 40 mg by mouth daily.  . ferrous sulfate 325 (65 FE) MG tablet Take 1 tablet (325 mg total) by mouth 2 (two) times daily with a meal.  . fluticasone (FLONASE) 50 MCG/ACT nasal spray Place 2 sprays into both nostrils 2 (two) times daily. (Patient not taking: Reported on 10/17/2018)  . levothyroxine (SYNTHROID) 50 MCG tablet Take 1 tablet (50 mcg total) by mouth daily before breakfast.  . meloxicam (MOBIC) 15 MG tablet Take 1 tablet (15 mg total) by mouth daily.  . mometasone-formoterol (DULERA) 200-5 MCG/ACT AERO Inhale 2 puffs into the lungs 2 (two) times daily.  . nitroGLYCERIN (NITROSTAT) 0.4 MG SL tablet Place 1 tablet (0.4 mg total) under the tongue every 5 (five) minutes as needed for chest pain. (Patient not taking: Reported on 10/17/2018)  . torsemide (DEMADEX) 20 MG tablet Take 3 tablets (60 mg total) by mouth daily. (Patient taking differently: Take 40 mg by mouth daily. )  . traZODone (DESYREL) 150 MG tablet Take 1 tablet (150 mg total) by mouth at bedtime as needed for sleep.   No facility-administered encounter medications on file as of 10/17/2018.      Goals Addressed            This Visit's Progress     Patient Stated   . PharmD "I'm in pain and depressed" (pt-stated)  Current Barriers:  . Patient endorses significant depression, especially since the passing of his significant other in April, complicated by chronic pain.  . Saw Dr. Holley Raring in pain clinic, but notes that he wasn't interested in the interventions recommended . He notes that he is taking duloxetine and doesn't believe it will provide any benefit for his mood; however, refill history suggests he may not be taking this  Pharmacist Clinical Goal(s):  [name] Over the next 90 days, patient will work with CCM team and primary care provider to address needs  related to management of chronic pain and depression  Interventions: . Encouraged patient to give duloxetine more of a chance to determine full benefit. Will continue to collaborate with [name], LCSW for mood support.   Patient Self Care Activities:  . Self administers medications as prescribed  Please see past updates related to this goal by clicking on the "Past Updates" button in the selected goal      . PharmD "I'm on a lot of medications" (pt-stated)       Current Barriers:  [name] Knowledge Deficits related to optimal medication management o Literacy Barriers - is able to spell out medication names to me and instructions over the phone, but reads slowly  o Medication Management - considering pill packing in the future; however, patient notes he can't afford to fill all of his medications at once.  o Financial Barriers - Continues to report that he's having a hard time affording medications, food, etc since he received more aid/had lower copays in [place]. Patient notes he called and left a message with his social security case worker. [name] Appointment with Dr. Saunders Revel, cardiology, on 11/01/2018  Pharmacist Clinical Goal(s):  [name] Over the next 90 days, patient will work with CCM team to address needs related to optimized medication management  Interventions: . Discussed pill packing with [facility] again. Patient declines, noting that he can handle managing his medications  . Collaborated with [name], LCSW. Provided patient direct number for Social Security office in [place] to contact to follow up on disability application.    Patient Self Care Activities:  . Calls pharmacy for medication refills . Calls provider office for new concerns or questions  Please see past updates related to this goal by clicking on the "Past Updates" button in the selected goal          Plan:  - CCM team will outreach patient in the next 3-5 weeks for continued support  [name], PharmD  Clinical Pharmacist [place] [phone]

## 2018-10-17 NOTE — Assessment & Plan Note (Signed)
Unclear if he has been taking his cymbalta. He states yes, but refills suggest he has not. We will continue on 40mg  for another 6 weeks- strongly encouraged patient to take medicine DAILY and recheck at that time.

## 2018-10-17 NOTE — Progress Notes (Signed)
BP 115/76   Pulse 66   Temp 98.8 F (37.1 C) (Oral)   Ht 5\' 8"  (1.727 m)   SpO2 96%   BMI 60.97 kg/m    Subjective:    Patient ID: [name] [name], male    DOB: [birth year], 58 y.o.   MRN: [id number]  HPI: [name] [name] is a 58 y.o. male  Chief Complaint  Patient presents with  . Follow-up    abnormal thyroid blood test   DIABETES Hypoglycemic episodes:no Polydipsia/polyuria: no Visual disturbance: no Chest pain: no Paresthesias: no Glucose Monitoring: no  Accucheck frequency: Not Checking Taking Insulin?: no Blood Pressure Monitoring: not checking Retinal Examination: Not up date Foot Exam: Not up to Date Diabetic Education: Not Completed Pneumovax: Refused Influenza: Refused Aspirin: no  DEPRESSION- went up to 40mg  last time on the cymbalta, no better.  Mood status: uncontrolled Satisfied with current treatment?: no Symptom severity: moderate  Duration of current treatment : chronic Side effects: no Medication compliance: fair compliance Psychotherapy/counseling: no  Previous psychiatric medications: cymbalta Depressed mood: yes Anxious mood: yes Anhedonia: no Significant weight loss or gain: no Insomnia: yes hard to fall asleep Fatigue: yes Feelings of worthlessness or guilt: yes Impaired concentration/indecisiveness: yes Suicidal ideations: no Hopelessness: yes Crying spells: yes Depression screen [facility] 2/9 10/17/2018 09/12/2018  Decreased Interest 1 1  Down, Depressed, Hopeless 1 2  PHQ - 2 Score 2 3  Altered sleeping 3 2  Tired, decreased energy 3 3  Change in appetite 3 3  Feeling bad or failure about yourself  2 0  Trouble concentrating 1 0  Moving slowly or fidgety/restless 0 0  Suicidal thoughts 0 0  PHQ-9 Score 14 11  Difficult doing work/chores Very difficult Somewhat difficult   HYPOTHYROIDISM- has not been taking his thyroid medicine, taking it about 2x a week Thyroid control status:uncontrolled Satisfied with current treatment? no  Medication side effects: no Medication compliance: poor compliance Recent dose adjustment:yes Fatigue: yes Cold intolerance: yes Heat intolerance: yes Weight gain: yes Weight loss: no Constipation: no Diarrhea/loose stools: no Palpitations: no Lower extremity edema: yes Anxiety/depressed mood: yes  SLEEP APNEA Sleep apnea status: uncontrolled- CPAP not working Duration: chronic Satisfied with current treatment?:  no CPAP use:  no Sleep quality with CPAP use: good Treament compliance:poor compliance Last sleep study: years ago in [place]- not available Treatments attempted: CPAP Wakes feeling refreshed:  no Daytime hypersomnolence:  yes Fatigue:  yes Insomnia:  yes Good sleep hygiene:  no Difficulty falling asleep:  yes Difficulty staying asleep:  yes Snoring bothers bed partner:  N/A Observed apnea by bed partner: N/A Obesity:  yes Hypertension: yes  Coronary artery disease:  yes  Relevant past medical, surgical, family and social history reviewed and updated as indicated. Interim medical history since our last visit reviewed. Allergies and medications reviewed and updated.  Review of Systems  Constitutional: Negative.   Respiratory: Negative.   Cardiovascular: Negative.   Musculoskeletal: Positive for arthralgias, back pain, gait problem and myalgias. Negative for joint swelling, neck pain and neck stiffness.  Skin: Negative.   Neurological: Positive for tremors. Negative for dizziness, seizures, syncope, facial asymmetry, speech difficulty, weakness, light-headedness, numbness and headaches.  Psychiatric/Behavioral: Positive for confusion, decreased concentration, dysphoric mood and sleep disturbance. Negative for agitation, behavioral problems, hallucinations, self-injury and suicidal ideas. The patient is nervous/anxious. The patient is not hyperactive.     Per HPI unless specifically indicated above     Objective:    BP 115/76   Pulse 66  Temp 98.8 F  (37.1 C) (Oral)   Ht 5\' 8"  (1.727 m)   SpO2 96%   BMI 60.97 kg/m   Wt Readings from Last 3 Encounters:  09/12/18 (!) 401 lb (181.9 kg)  08/29/18 (!) 395 lb (179.2 kg)  08/10/18 (!) 412 lb 11.2 oz (187.2 kg)    Physical Exam Vitals signs and nursing note reviewed.  Constitutional:      General: He is not in acute distress.    Appearance: Normal appearance. He is obese. He is not ill-appearing, toxic-appearing or diaphoretic.  HENT:     Head: Normocephalic and atraumatic.     Right Ear: External ear normal.     Left Ear: External ear normal.     Nose: Nose normal.     Mouth/Throat:     Mouth: Mucous membranes are moist.     Pharynx: Oropharynx is clear.  Eyes:     General: No scleral icterus.       Right eye: No discharge.        Left eye: No discharge.     Extraocular Movements: Extraocular movements intact.     Conjunctiva/sclera: Conjunctivae normal.     Pupils: Pupils are equal, round, and reactive to light.  Neck:     Musculoskeletal: Normal range of motion and neck supple.  Cardiovascular:     Rate and Rhythm: Normal rate and regular rhythm.     Pulses: Normal pulses.     Heart sounds: Normal heart sounds. No murmur. No friction rub. No gallop.   Pulmonary:     Effort: Pulmonary effort is normal. No respiratory distress.     Breath sounds: Normal breath sounds. No stridor. No wheezing, rhonchi or rales.  Chest:     Chest wall: No tenderness.  Musculoskeletal: Normal range of motion.     Right lower leg: Edema present.     Left lower leg: Edema present.     Comments: Bilateral legs in unna boots  Skin:    General: Skin is warm and dry.     Capillary Refill: Capillary refill takes less than 2 seconds.     Coloration: Skin is not jaundiced or pale.     Findings: No bruising, erythema, lesion or rash.  Neurological:     General: No focal deficit present.     Mental Status: He is alert and oriented to person, place, and time. Mental status is at baseline.   Psychiatric:        Mood and Affect: Mood normal.        Behavior: Behavior normal.        Thought Content: Thought content normal.        Judgment: Judgment normal.     Results for orders placed or performed in visit on 09/12/18  TSH  Result Value Ref Range   TSH 8.090 (H) 0.450 - 4.500 uIU/mL  CBC with Differential/Platelet  Result Value Ref Range   WBC 6.5 3.4 - 10.8 x10E3/uL   RBC 4.49 4.14 - 5.80 x10E6/uL   Hemoglobin 13.0 13.0 - 17.7 g/dL   Hematocrit 06.3 01.6 - 51.0 %   MCV 89 79 - 97 fL   MCH 29.0 26.6 - 33.0 pg   MCHC 32.4 31.5 - 35.7 g/dL   RDW 01.0 (H) 93.2 - 35.5 %   Platelets 200 150 - 450 x10E3/uL   Neutrophils 68 Not Estab. %   Lymphs 18 Not Estab. %   Monocytes 9 Not Estab. %   Eos 5  Not Estab. %   Basos 0 Not Estab. %   Neutrophils Absolute 4.4 1.4 - 7.0 x10E3/uL   Lymphocytes Absolute 1.2 0.7 - 3.1 x10E3/uL   Monocytes Absolute 0.6 0.1 - 0.9 x10E3/uL   EOS (ABSOLUTE) 0.3 0.0 - 0.4 x10E3/uL   Basophils Absolute 0.0 0.0 - 0.2 x10E3/uL   Immature Granulocytes 0 Not Estab. %   Immature Grans (Abs) 0.0 0.0 - 0.1 x10E3/uL  Comprehensive metabolic panel  Result Value Ref Range   Glucose 112 (H) 65 - 99 mg/dL   BUN 15 6 - 24 mg/dL   Creatinine, Ser 1.301.26 0.76 - 1.27 mg/dL   GFR calc non Af Amer 62 >59 mL/min/1.73   GFR calc Af Amer 72 >59 mL/min/1.73   BUN/Creatinine Ratio 12 9 - 20   Sodium 135 134 - 144 mmol/L   Potassium 4.7 3.5 - 5.2 mmol/L   Chloride 94 (L) 96 - 106 mmol/L   CO2 23 20 - 29 mmol/L   Calcium 9.8 8.7 - 10.2 mg/dL   Total Protein 7.3 6.0 - 8.5 g/dL   Albumin 4.2 3.8 - 4.9 g/dL   Globulin, Total 3.1 1.5 - 4.5 g/dL   Albumin/Globulin Ratio 1.4 1.2 - 2.2   Bilirubin Total 0.5 0.0 - 1.2 mg/dL   Alkaline Phosphatase 119 (H) 39 - 117 IU/L   AST 17 0 - 40 IU/L   ALT 11 0 - 44 IU/L      Assessment & Plan:   Problem List Items Addressed This Visit      Respiratory   Obstructive sleep apnea (Chronic)    Needs new CPAP. No record of  previous sleep study- done in [place]. Patient requests referral to pulmonology. Referral to pulmonology and sleep medicine made today.      Relevant Orders   Ambulatory referral to Pulmonology   Ambulatory referral to Sleep Studies     Endocrine   Diet-controlled diabetes mellitus (HCC)    Under good control with A1c of 5.7- continue diet and exercise. Continue to monitor.       Relevant Orders   Bayer DCA Hb A1c Waived   Ambulatory referral to Ophthalmology     Other   Abnormal thyroid blood test    Due for recheck on his thyroid in 2 weeks, however he has only been taking his thyroid meds 2x a week. Strongly encouraged patient to take his medicine DAILY and we will recheck labs at visit in 6 weeks.       Depression, major, single episode, moderate (HCC) - Primary    Unclear if he has been taking his cymbalta. He states yes, but refills suggest he has not. We will continue on 40mg  for another 6 weeks- strongly encouraged patient to take medicine DAILY and recheck at that time.           Follow up plan: Return in about 6 weeks (around 11/28/2018).

## 2018-10-18 NOTE — Chronic Care Management (AMB) (Signed)
Chronic Care Management   Follow Up Note   10/17/2018 Name: [name] [name] MRN: [id number] DOB: 1961-01-26  Referred by: [name] Carrow, DO Reason for referral : No chief complaint on file.   [name] [name] is a 58 y.o. year old male who is a primary care patient of [name] Carrow, DO. The CCM team was consulted for assistance with chronic disease management and care coordination needs.    Review of patient status, including review of consultants reports, relevant laboratory and other test results, and collaboration with appropriate care team members and the patient's provider was performed as part of comprehensive patient evaluation and provision of chronic care management services.    SDOH (Social Determinants of Health) screening performed today: Financial Strain  Depression   Social Connections Physical Activity. See Care Plan for related entries.   Outpatient Encounter Medications as of 10/17/2018  Medication Sig  . albuterol (VENTOLIN HFA) 108 (90 Base) MCG/ACT inhaler Inhale 2 puffs into the lungs every 6 (six) hours as needed for wheezing or shortness of breath.  . allopurinol (ZYLOPRIM) 100 MG tablet Take 1 tablet (100 mg total) by mouth daily. (Patient not taking: Reported on 10/17/2018)  . amiodarone (PACERONE) 200 MG tablet Take 1 tablet (200 mg total) by mouth daily.  [name] atorvastatin (LIPITOR) 40 MG tablet TAKE 1 TABLET BY MOUTH ONCE DAILY  . budesonide-formoterol (SYMBICORT) 160-4.5 MCG/ACT inhaler Inhale 2 puffs into the lungs 2 (two) times daily. (Patient not taking: Reported on 10/17/2018)  . Colchicine (MITIGARE) 0.6 MG CAPS Take 1 tablet by mouth every other day.  . DULoxetine HCl 40 MG CPEP Take 40 mg by mouth daily.  . ferrous sulfate 325 (65 FE) MG tablet Take 1 tablet (325 mg total) by mouth 2 (two) times daily with a meal.  . fluticasone (FLONASE) 50 MCG/ACT nasal spray Place 2 sprays into both nostrils 2 (two) times daily. (Patient not taking: Reported on 10/17/2018)   . levothyroxine (SYNTHROID) 50 MCG tablet Take 1 tablet (50 mcg total) by mouth daily before breakfast.  . lisinopril (ZESTRIL) 5 MG tablet Take 1 tablet (5 mg total) by mouth daily.  . meloxicam (MOBIC) 15 MG tablet Take 1 tablet (15 mg total) by mouth daily.  . metoprolol succinate (TOPROL-XL) 25 MG 24 hr tablet Take 1 tablet (25 mg total) by mouth 2 (two) times daily.  . mometasone-formoterol (DULERA) 200-5 MCG/ACT AERO Inhale 2 puffs into the lungs 2 (two) times daily.  . nitroGLYCERIN (NITROSTAT) 0.4 MG SL tablet Place 1 tablet (0.4 mg total) under the tongue every 5 (five) minutes as needed for chest pain. (Patient not taking: Reported on 10/17/2018)  . rivaroxaban (XARELTO) 20 MG TABS tablet Take 1 tablet (20 mg total) by mouth daily with supper.  [name] spironolactone (ALDACTONE) 25 MG tablet Take 1 tablet (25 mg total) by mouth daily.  [name] torsemide (DEMADEX) 20 MG tablet Take 3 tablets (60 mg total) by mouth daily. (Patient taking differently: Take 40 mg by mouth daily. )  . traZODone (DESYREL) 150 MG tablet Take 1 tablet (150 mg total) by mouth at bedtime as needed for sleep.   No facility-administered encounter medications on file as of 10/17/2018.      Goals Addressed            This Visit's Progress   . RN-I need help managing my overall health (pt-stated)       Current Barriers:  . Lacks caregiver support.  . Corporate treasurer.  . Literacy barriers .  Transportation barriers . Difficulty obtaining medications . Cognitive Deficits . Chronic Disease Management support and education needs related to managing multiple chronic diseases as evidenced by multiple hospitalizations in the last 6 months.  Nurse Case Manager Clinical Goal(s):  [name] Over the next 90 days, patient will not experience hospital admission. Hospital Admissions in last 6 months = 5  Interventions:  . Evaluation of current treatment plan related to COPD and HF and patient's adherence to plan as established by  provider. . Provided education to patient re: HF . Collaborated with CCM PharmD regarding patient's expressed medication costs and poly pharmacy.Co-visit  . Discussed plans with patient for ongoing care management follow up and provided patient with direct contact information for care management team . Reviewed scheduled/upcoming provider appointments including: Cardiology Appt 9/16 @ 3:40- confirmed pt had transportation  . Patient stated he has a hard time using the ramp on his [name] to travel with his scooter.  . Stated he has PCS services starting with [name] [name] . Stated he is continuing to work with disability office to obtain benefits . Empathetic listening about patient's grief and daily struggles. Patient refusing much of PharmD and RNCM advice towards health.  . Requested for patient to bring medications to appointments . Written education given by PharmD about medications and SSI benefits.  Patient Self Care Activities:  . Currently UNABLE TO independently manage multiple chronic disease processes as evidenced by 5 hospital admits in the last 6 mos  Please see past updates related to this goal by clicking on the "Past Updates" button in the selected goal  Previous goal of weight loss d/c'd related to patient's new focus          The care management team will reach out to the patient again over the next 30 days.  The patient has been provided with contact information for the care management team and has been advised to call with any health related questions or concerns.    Merlene Morse [name] RN, BSN Nurse Case Editor, [facility]  [phone]) Business Mobile

## 2018-10-19 DIAGNOSIS — I872 Venous insufficiency (chronic) (peripheral): Secondary | ICD-10-CM | POA: Diagnosis not present

## 2018-10-23 ENCOUNTER — Emergency Department: Payer: Medicaid Other

## 2018-10-23 ENCOUNTER — Other Ambulatory Visit: Payer: Self-pay

## 2018-10-23 ENCOUNTER — Inpatient Hospital Stay
Admission: EM | Admit: 2018-10-23 | Discharge: 2018-10-28 | DRG: 176 | Disposition: A | Discharge status: Home Health | Payer: Medicaid Other | Attending: Internal Medicine | Admitting: Internal Medicine

## 2018-10-23 ENCOUNTER — Encounter: Payer: Self-pay | Admitting: Emergency Medicine

## 2018-10-23 DIAGNOSIS — M25562 Pain in left knee: Secondary | ICD-10-CM | POA: Diagnosis present

## 2018-10-23 DIAGNOSIS — I5022 Chronic systolic (congestive) heart failure: Secondary | ICD-10-CM | POA: Diagnosis present

## 2018-10-23 DIAGNOSIS — I10 Essential (primary) hypertension: Secondary | ICD-10-CM | POA: Diagnosis present

## 2018-10-23 DIAGNOSIS — Z87820 Personal history of traumatic brain injury: Secondary | ICD-10-CM

## 2018-10-23 DIAGNOSIS — R110 Nausea: Secondary | ICD-10-CM | POA: Diagnosis not present

## 2018-10-23 DIAGNOSIS — R079 Chest pain, unspecified: Secondary | ICD-10-CM | POA: Diagnosis present

## 2018-10-23 DIAGNOSIS — R109 Unspecified abdominal pain: Secondary | ICD-10-CM | POA: Diagnosis not present

## 2018-10-23 DIAGNOSIS — Z20828 Contact with and (suspected) exposure to other viral communicable diseases: Secondary | ICD-10-CM | POA: Diagnosis present

## 2018-10-23 DIAGNOSIS — G8929 Other chronic pain: Secondary | ICD-10-CM | POA: Diagnosis present

## 2018-10-23 DIAGNOSIS — I482 Chronic atrial fibrillation, unspecified: Secondary | ICD-10-CM | POA: Diagnosis present

## 2018-10-23 DIAGNOSIS — I5033 Acute on chronic diastolic (congestive) heart failure: Secondary | ICD-10-CM | POA: Diagnosis present

## 2018-10-23 DIAGNOSIS — J432 Centrilobular emphysema: Secondary | ICD-10-CM | POA: Diagnosis present

## 2018-10-23 DIAGNOSIS — I5042 Chronic combined systolic (congestive) and diastolic (congestive) heart failure: Secondary | ICD-10-CM | POA: Diagnosis present

## 2018-10-23 DIAGNOSIS — I4819 Other persistent atrial fibrillation: Secondary | ICD-10-CM | POA: Diagnosis present

## 2018-10-23 DIAGNOSIS — J449 Chronic obstructive pulmonary disease, unspecified: Secondary | ICD-10-CM | POA: Diagnosis present

## 2018-10-23 DIAGNOSIS — E669 Obesity, unspecified: Secondary | ICD-10-CM | POA: Diagnosis present

## 2018-10-23 DIAGNOSIS — Z7901 Long term (current) use of anticoagulants: Secondary | ICD-10-CM

## 2018-10-23 DIAGNOSIS — R1030 Lower abdominal pain, unspecified: Secondary | ICD-10-CM | POA: Diagnosis not present

## 2018-10-23 DIAGNOSIS — G4733 Obstructive sleep apnea (adult) (pediatric): Secondary | ICD-10-CM | POA: Diagnosis present

## 2018-10-23 DIAGNOSIS — Z79890 Hormone replacement therapy (postmenopausal): Secondary | ICD-10-CM

## 2018-10-23 DIAGNOSIS — Z6841 Body Mass Index (BMI) 40.0 and over, adult: Secondary | ICD-10-CM

## 2018-10-23 DIAGNOSIS — K5730 Diverticulosis of large intestine without perforation or abscess without bleeding: Secondary | ICD-10-CM | POA: Diagnosis not present

## 2018-10-23 DIAGNOSIS — I110 Hypertensive heart disease with heart failure: Secondary | ICD-10-CM | POA: Diagnosis present

## 2018-10-23 DIAGNOSIS — I2699 Other pulmonary embolism without acute cor pulmonale: Principal | ICD-10-CM | POA: Diagnosis present

## 2018-10-23 DIAGNOSIS — R600 Localized edema: Secondary | ICD-10-CM

## 2018-10-23 DIAGNOSIS — Z801 Family history of malignant neoplasm of trachea, bronchus and lung: Secondary | ICD-10-CM

## 2018-10-23 DIAGNOSIS — R339 Retention of urine, unspecified: Secondary | ICD-10-CM | POA: Diagnosis not present

## 2018-10-23 DIAGNOSIS — I82409 Acute embolism and thrombosis of unspecified deep veins of unspecified lower extremity: Secondary | ICD-10-CM

## 2018-10-23 DIAGNOSIS — Z7951 Long term (current) use of inhaled steroids: Secondary | ICD-10-CM

## 2018-10-23 DIAGNOSIS — M25561 Pain in right knee: Secondary | ICD-10-CM | POA: Diagnosis present

## 2018-10-23 DIAGNOSIS — Z8249 Family history of ischemic heart disease and other diseases of the circulatory system: Secondary | ICD-10-CM

## 2018-10-23 DIAGNOSIS — M109 Gout, unspecified: Secondary | ICD-10-CM | POA: Diagnosis present

## 2018-10-23 HISTORY — DX: Essential (primary) hypertension: I10

## 2018-10-23 LAB — BASIC METABOLIC PANEL
Anion gap: 13 (ref 5–15)
BUN: 12 mg/dL (ref 6–20)
CO2: 23 mmol/L (ref 22–32)
Calcium: 8.6 mg/dL — ABNORMAL LOW (ref 8.9–10.3)
Chloride: 103 mmol/L (ref 98–111)
Creatinine, Ser: 1.12 mg/dL (ref 0.61–1.24)
GFR calc Af Amer: >60 mL/min (ref >60)
GFR calc non Af Amer: >60 mL/min (ref >60)
Glucose, Bld: 124 mg/dL — ABNORMAL HIGH (ref 70–99)
Potassium: 3.9 mmol/L (ref 3.5–5.1)
Sodium: 139 mmol/L (ref 135–145)

## 2018-10-23 LAB — CBC WITH DIFFERENTIAL/PLATELET
Abs Immature Granulocytes: 0.06 10*3/uL (ref 0.00–0.07)
Basophils Absolute: 0.1 10*3/uL (ref 0.0–0.1)
Basophils Relative: 1 %
Eosinophils Absolute: 0.1 10*3/uL (ref 0.0–0.5)
Eosinophils Relative: 1 %
HCT: 36.2 % — ABNORMAL LOW (ref 39.0–52.0)
Hemoglobin: 11.8 g/dL — ABNORMAL LOW (ref 13.0–17.0)
Immature Granulocytes: 1 %
Lymphocytes Relative: 14 %
Lymphs Abs: 1 10*3/uL (ref 0.7–4.0)
MCH: 29.4 pg (ref 26.0–34.0)
MCHC: 32.6 g/dL (ref 30.0–36.0)
MCV: 90.3 fL (ref 80.0–100.0)
Monocytes Absolute: 0.7 10*3/uL (ref 0.1–1.0)
Monocytes Relative: 10 %
Neutro Abs: 5.3 10*3/uL (ref 1.7–7.7)
Neutrophils Relative %: 73 %
Platelets: 258 10*3/uL (ref 150–400)
RBC: 4.01 MIL/uL — ABNORMAL LOW (ref 4.22–5.81)
RDW: 17.2 % — ABNORMAL HIGH (ref 11.5–15.5)
WBC: 7.2 10*3/uL (ref 4.0–10.5)
nRBC: 0 % (ref 0.0–0.2)

## 2018-10-23 LAB — HEPATIC FUNCTION PANEL
ALT: 16 U/L (ref 0–44)
AST: 34 U/L (ref 15–41)
Albumin: 3.6 g/dL (ref 3.5–5.0)
Alkaline Phosphatase: 79 U/L (ref 38–126)
Bilirubin, Direct: 0.4 mg/dL — ABNORMAL HIGH (ref 0.0–0.2)
Indirect Bilirubin: 0.5 mg/dL (ref 0.3–0.9)
Total Bilirubin: 0.9 mg/dL (ref 0.3–1.2)
Total Protein: 7.5 g/dL (ref 6.5–8.1)

## 2018-10-23 LAB — CBC
HCT: 36.6 % — ABNORMAL LOW (ref 39.0–52.0)
Hemoglobin: 11.8 g/dL — ABNORMAL LOW (ref 13.0–17.0)
MCH: 29 pg (ref 26.0–34.0)
MCHC: 32.2 g/dL (ref 30.0–36.0)
MCV: 89.9 fL (ref 80.0–100.0)
Platelets: 235 10*3/uL (ref 150–400)
RBC: 4.07 MIL/uL — ABNORMAL LOW (ref 4.22–5.81)
RDW: 17.1 % — ABNORMAL HIGH (ref 11.5–15.5)
WBC: 7.4 10*3/uL (ref 4.0–10.5)
nRBC: 0 % (ref 0.0–0.2)

## 2018-10-23 LAB — PROTIME-INR
INR: 1.1 (ref 0.8–1.2)
Prothrombin Time: 13.7 seconds (ref 11.4–15.2)

## 2018-10-23 LAB — TROPONIN I (HIGH SENSITIVITY)
Troponin I (High Sensitivity): 31 ng/L — ABNORMAL HIGH (ref <18)
Troponin I (High Sensitivity): 25 ng/L — ABNORMAL HIGH (ref <18)

## 2018-10-23 LAB — FIBRIN DERIVATIVES D-DIMER (ARMC ONLY): Fibrin derivatives D-dimer (ARMC): 1076.77 ng/mL (FEU) — ABNORMAL HIGH (ref 0.00–499.00)

## 2018-10-23 LAB — APTT: aPTT: 27 seconds (ref 24–36)

## 2018-10-23 LAB — LIPASE, BLOOD: Lipase: 24 U/L (ref 11–51)

## 2018-10-23 MED ORDER — SODIUM CHLORIDE 0.9% FLUSH: 3.0000 mL | Freq: Once | INTRAVENOUS | Status: DC

## 2018-10-23 MED ORDER — IOHEXOL 9 MG/ML PO SOLN
500.0000 mL | Freq: Once | ORAL | Status: AC
  Administered 2018-10-23: 20:00:00 500 mL via ORAL

## 2018-10-23 MED ORDER — PROMETHAZINE HCL 25 MG/ML IJ SOLN
25.0000 mg | Freq: Once | INTRAMUSCULAR | Status: AC
  Administered 2018-10-23: 21:00:00 25 mg via INTRAMUSCULAR

## 2018-10-23 MED ORDER — LIDOCAINE VISCOUS HCL 2 % MT SOLN
15.0000 mL | Freq: Once | OROMUCOSAL | Status: AC
  Administered 2018-10-23: 23:00:00 15 mL via ORAL
  Filled 2018-10-23: qty 15

## 2018-10-23 MED ORDER — ASPIRIN 81 MG PO CHEW
324.0000 mg | CHEWABLE_TABLET | Freq: Once | ORAL | Status: AC
  Administered 2018-10-23: 324 mg via ORAL
  Filled 2018-10-23: qty 4

## 2018-10-23 MED ORDER — ALUM & MAG HYDROXIDE-SIMETH 200-200-20 MG/5ML PO SUSP
30.0000 mL | Freq: Once | ORAL | Status: AC
  Administered 2018-10-23: 30 mL via ORAL
  Filled 2018-10-23: qty 30

## 2018-10-23 MED ORDER — PROMETHAZINE HCL 25 MG/ML IJ SOLN: 25.0000 mg | Freq: Once | INTRAMUSCULAR | Status: DC

## 2018-10-23 MED ORDER — DICYCLOMINE HCL 10 MG/5ML PO SOLN
10.0000 mg | Freq: Once | ORAL | Status: AC
  Administered 2018-10-23: 10 mg via ORAL
  Filled 2018-10-23 (×2): qty 5

## 2018-10-23 MED ORDER — NITROGLYCERIN 2 % TD OINT
0.5000 [in_us] | TOPICAL_OINTMENT | Freq: Once | TRANSDERMAL | Status: AC
  Administered 2018-10-23: 20:00:00 0.5 [in_us] via TOPICAL
  Filled 2018-10-23: qty 1

## 2018-10-23 MED ORDER — IOHEXOL 300 MG/ML  SOLN
125.0000 mL | Freq: Once | INTRAMUSCULAR | Status: AC | PRN
  Administered 2018-10-23: 125 mL via INTRAVENOUS

## 2018-10-23 NOTE — ED Notes (Signed)
IV team at bedside. Understands that blood draw is needed. Blue tube to be sent to lab.

## 2018-10-23 NOTE — ED Triage Notes (Signed)
C/O left sided chest pain onset today at noon.  Also c/o nausea.  4 mg Zofran IM given by EMS PTA.  Per EMS, vs wnl.

## 2018-10-23 NOTE — ED Notes (Signed)
Repeat EKG completed

## 2018-10-23 NOTE — ED Notes (Addendum)
Charge notified of pt's troponin; bed requested and acuity changed

## 2018-10-23 NOTE — ED Notes (Signed)
Attempted for 20g IV at R ac.  

## 2018-10-23 NOTE — ED Notes (Signed)
EDP Malinda notified IV team stuck pt 3 times but was unable to obtain IV access.

## 2018-10-23 NOTE — ED Notes (Signed)
IV attempt unsuccessful, EDP aware, IV consult placed

## 2018-10-23 NOTE — ED Notes (Signed)
Butch RN looking to place IV.

## 2018-10-23 NOTE — ED Notes (Signed)
Pt leaving for CT.  

## 2018-10-23 NOTE — ED Provider Notes (Addendum)
[facility] Emergency Department Provider Note   ____________________________________________   First MD Initiated Contact with Patient 10/23/18 1946     (approximate)  I have reviewed the triage vital signs and the nursing notes.   HISTORY  Chief Complaint Chest Pain    HPI [name] [name] is a 58 y.o. male who reports his heart is hurting.  It started at lunchtime.  His deep and achy he is nauseated he also has lower abdominal pain.  The abdominal pain is also deep and achy.  He is not really short of breath.  Troponin initial reading is come back prior to him getting into the emergency room room.  It is 31.  He has a history of elevated troponins in the past 0.03 and 0.04.  He says the pain is worse with movement and deep breathing.        Past Medical History:  Diagnosis Date  . Asthma   . Brain damage   . Chronic pain of both knees 07/13/2018  . COPD (chronic obstructive pulmonary disease) (HCC)   . Depression   . HFrEF (heart failure with reduced ejection fraction) (HCC)    a. 03/2018 Echo: EF 25-30%, diff HK. Mod dil LA.  [name]. Hypertension   . Neck pain 07/13/2018  . Persistent atrial fibrillation    a. 03/2018 s/p DCCV; b. CHA2DS2VASc = 1-->Xarelto; c. 05/2018 recurrent afib-->Amio initiated;   . Sleep apnea     Patient Active Problem List   Diagnosis Date Noted  . Depression, major, single episode, moderate (HCC) 09/17/2018  . Acute on chronic systolic CHF (congestive heart failure) (HCC) 07/18/2018  . Chronic atrial fibrillation   . Personal history of DVT (deep vein thrombosis) 07/13/2018  . History of pulmonary embolism (on Xarelto) 07/13/2018  . Chronic bilateral low back pain with bilateral sciatica 07/13/2018  . TBI (traumatic brain injury) (HCC) 07/04/2018  . Anemia 06/27/2018  . Abnormal thyroid blood test 06/27/2018  . Diet-controlled diabetes mellitus (HCC) 06/06/2018  . HTN (hypertension) 06/06/2018  . Gout 06/06/2018  .  Morbid obesity (HCC) 06/06/2018  . Chronic pain 06/06/2018  . Ulcers of both lower extremities, limited to breakdown of skin (HCC) 06/06/2018  . Persistent atrial fibrillation   . Chronic systolic heart failure (HCC) 02/02/2018  . COPD (chronic obstructive pulmonary disease) (HCC) 02/02/2018  . Obstructive sleep apnea 02/02/2018  . Acute respiratory failure with hypoxia (HCC) 01/27/2018    Past Surgical History:  Procedure Laterality Date  . CARDIOVERSION N/A 03/24/2018   Procedure: CARDIOVERSION;  Surgeon: Antonieta IbaGollan, Timothy J, MD;  Location: [facility] ORS;  Service: Cardiovascular;  Laterality: N/A;  . CARDIOVERSION N/A 08/08/2018   Procedure: CARDIOVERSION;  Surgeon: Yvonne KendallEnd, Christopher, MD;  Location: [facility] ORS;  Service: Cardiovascular;  Laterality: N/A;  . hearnia repair     X 3- total of two surgeries  . LEG SURGERY      Prior to Admission medications   Medication Sig Start Date End Date Taking? Authorizing Provider  albuterol (VENTOLIN HFA) 108 (90 Base) MCG/ACT inhaler Inhale 2 puffs into the lungs every 6 (six) hours as needed for wheezing or shortness of breath. 07/04/18   Johnson, Megan P, DO  allopurinol (ZYLOPRIM) 100 MG tablet Take 1 tablet (100 mg total) by mouth daily. Patient not taking: Reported on 10/17/2018 06/27/18   Olevia PerchesJohnson, Megan P, DO  amiodarone (PACERONE) 200 MG tablet Take 1 tablet (200 mg total) by mouth daily. 08/11/18   Katha HammingKonidena, Snehalatha, MD  atorvastatin (LIPITOR) 40  MG tablet TAKE 1 TABLET BY MOUTH ONCE DAILY 09/08/18   Johnson, Megan P, DO  budesonide-formoterol (SYMBICORT) 160-4.5 MCG/ACT inhaler Inhale 2 puffs into the lungs 2 (two) times daily. Patient not taking: Reported on 10/17/2018 07/04/18   [name], DO  Colchicine (MITIGARE) 0.6 MG CAPS Take 1 tablet by mouth every other day.    [provider]  DULoxetine HCl 40 MG CPEP Take 40 mg by mouth daily. 09/12/18   [name], DO  ferrous sulfate 325 (65 FE) MG tablet Take 1 tablet (325 mg total)  by mouth 2 (two) times daily with a meal. 07/05/18   Johnson, Megan P, DO  fluticasone (FLONASE) 50 MCG/ACT nasal spray Place 2 sprays into both nostrils 2 (two) times daily. Patient not taking: Reported on 10/17/2018 06/01/18   Salary, Evelena Asa, MD  levothyroxine (SYNTHROID) 50 MCG tablet Take 1 tablet (50 mcg total) by mouth daily before breakfast. 09/19/18   Laural Benes, Megan P, DO  lisinopril (ZESTRIL) 5 MG tablet Take 1 tablet (5 mg total) by mouth daily. 08/11/18   Katha Hamming, MD  meloxicam (MOBIC) 15 MG tablet Take 1 tablet (15 mg total) by mouth daily. 07/04/18   Johnson, Megan P, DO  metoprolol succinate (TOPROL-XL) 25 MG 24 hr tablet Take 1 tablet (25 mg total) by mouth 2 (two) times daily. 07/23/18   Katha Hamming, MD  mometasone-formoterol (DULERA) 200-5 MCG/ACT AERO Inhale 2 puffs into the lungs 2 (two) times daily.    [provider]  nitroGLYCERIN (NITROSTAT) 0.4 MG SL tablet Place 1 tablet (0.4 mg total) under the tongue every 5 (five) minutes as needed for chest pain. Patient not taking: Reported on 10/17/2018 01/18/18   Auburn Bilberry, MD  rivaroxaban (XARELTO) 20 MG TABS tablet Take 1 tablet (20 mg total) by mouth daily with supper. 06/29/18   End, Cristal Deer, MD  spironolactone (ALDACTONE) 25 MG tablet Take 1 tablet (25 mg total) by mouth daily. 07/04/18   Johnson, Megan P, DO  torsemide (DEMADEX) 20 MG tablet Take 3 tablets (60 mg total) by mouth daily. Patient taking differently: Take 40 mg by mouth daily.  08/11/18   Katha Hamming, MD  traZODone (DESYREL) 150 MG tablet Take 1 tablet (150 mg total) by mouth at bedtime as needed for sleep. 06/06/18   Dorcas Carrow, DO    Allergies Patient has no known allergies.  Family History  Problem Relation Age of Onset  . Heart failure Mother   . Lung cancer Mother   . Lung cancer Father   . Heart attack Maternal Grandmother   . Heart attack Maternal Grandfather     Social History Social History   Tobacco  Use  . Smoking status: Never Smoker  . Smokeless tobacco: Never Used  Substance Use Topics  . Alcohol use: Never    Frequency: Never  . Drug use: Never    Review of Systems  Constitutional: No fever/chills Eyes: No visual changes. ENT: No sore throat. Cardiovascular:chest pain. Respiratory:  shortness of breath. Gastrointestinal:  abdominal pain.   nausea,  vomiting.  No diarrhea.  No constipation. Genitourinary: Negative for dysuria. Musculoskeletal: Negative for back pain. Skin: Negative for rash. Neurological: Negative for headaches, focal weakness   ____________________________________________   PHYSICAL EXAM:  VITAL SIGNS: ED Triage Vitals  Enc Vitals Group     BP 10/23/18 1811 126/88     Pulse Rate 10/23/18 1811 71     Resp 10/23/18 1811 18  Temp 10/23/18 1811 99.7 F (37.6 C)     Temp src --      SpO2 10/23/18 1811 96 %     Weight 10/23/18 1808 (!) 395 lb (179.2 kg)     Height 10/23/18 1808 5\' 8"  (1.727 m)     Head Circumference --      Peak Flow --      Pain Score 10/23/18 1808 9     Pain Loc --      Pain Edu? --      Excl. in GC? --     Constitutional: Alert and oriented. Well appearing and in no acute distress. Eyes: Conjunctivae are normal.  Head: Atraumatic. Nose: No congestion/rhinnorhea. Mouth/Throat: Mucous membranes are moist.  Oropharynx non-erythematous. Neck: No stridor. Cardiovascular: Normal rate, regular rhythm. Grossly normal heart sounds.  Good peripheral circulation. Respiratory: Normal respiratory effort.  No retractions. Lungs CTAB. Gastrointestinal: Soft and nontender. No distention. No abdominal bruits. No CVA tenderness. Musculoskeletal: No lower extremity tenderness bilateral edema.  . Neurologic:  Normal speech and language. No gross focal neurologic deficits are appreciated.  Skin:  Skin is warm, dry and intact. No rash noted.  ____________________________________________   LABS (all labs ordered are listed, but only  abnormal results are displayed)  Labs Reviewed  BASIC METABOLIC PANEL - Abnormal; Notable for the following components:      Result Value   Glucose, Bld 124 (*)    Calcium 8.6 (*)    All other components within normal limits  CBC - Abnormal; Notable for the following components:   RBC 4.07 (*)    Hemoglobin 11.8 (*)    HCT 36.6 (*)    RDW 17.1 (*)    All other components within normal limits  HEPATIC FUNCTION PANEL - Abnormal; Notable for the following components:   Bilirubin, Direct 0.4 (*)    All other components within normal limits  CBC WITH DIFFERENTIAL/PLATELET - Abnormal; Notable for the following components:   RBC 4.01 (*)    Hemoglobin 11.8 (*)    HCT 36.2 (*)    RDW 17.2 (*)    All other components within normal limits  TROPONIN I (HIGH SENSITIVITY) - Abnormal; Notable for the following components:   Troponin I (High Sensitivity) 31 (*)    All other components within normal limits  TROPONIN I (HIGH SENSITIVITY) - Abnormal; Notable for the following components:   Troponin I (High Sensitivity) 25 (*)    All other components within normal limits  PROTIME-INR  APTT  LIPASE, BLOOD  URINALYSIS, COMPLETE (UACMP) WITH MICROSCOPIC  FIBRIN DERIVATIVES D-DIMER ([facility] ONLY)   ____________________________________________  EKG  EKG read interpreted by me shows sinus rhythm at a rate of 72 with first-degree AV block.  Normal axis left bundle branch block no obvious acute changes ____________________________________________  RADIOLOGY  ED MD interpretation: Chest x-ray read by radiology reviewed by me does not appear to show any acute abnormalities CT abdomen read by radiology is no acute disease. Official radiology report(s): Dg Chest 2 View  Result Date: 10/23/2018 CLINICAL DATA:  LEFT side chest pain beginning at noon today, nausea, history COPD, atrial fibrillation, heart failure EXAM: CHEST - 2 VIEW COMPARISON:  08/06/2018 FINDINGS: Borderline enlargement of cardiac  silhouette. Mediastinal contours and pulmonary vascularity normal. Lungs clear. No pleural effusion or pneumothorax. Bones unremarkable. IMPRESSION: No acute abnormalities. Electronically Signed   By: Ulyses SouthwardMark  Boles M.D.   On: 10/23/2018 18:52    ____________________________________________   PROCEDURES  Procedure(s) performed (including  Critical Care):  Procedures   ____________________________________________   INITIAL IMPRESSION / ASSESSMENT AND PLAN / ED COURSE  [name] [name] was evaluated in Emergency Department on 10/23/2018 for the symptoms described in the history of present illness. He was evaluated in the context of the global COVID-19 pandemic, which necessitated consideration that the patient might be at risk for infection with the SARS-CoV-2 virus that causes COVID-19. Institutional protocols and algorithms that pertain to the evaluation of patients at risk for COVID-19 are in a state of rapid change based on information released by regulatory bodies including the CDC and federal and state organizations. These policies and algorithms were followed during the patient's care in the ED.    Patient's troponin is trending downwards.  His labs are reassuring.  EKG is also okay.  We will see what is going on with his abdominal pain as soon as we can get a good IV and do the CAT scan.  I will have to sign the patient out to Dr. Beather Arbour his oncoming.  Patient with CT of abdomen which is okay but his chest pain was somewhat worsened by deep breathing and he had a very high d-dimer of over thousand.  Unfortunately that report came back after the CT had been done.  We will get him in the hospital and watch him overnight and either CT or VQ scan him later.     ____________________________________________   FINAL CLINICAL IMPRESSION(S) / ED DIAGNOSES  Final diagnoses:  Chest pain, unspecified type  Lower abdominal pain     ED Discharge Orders    None       Note:  This document  was prepared using Dragon voice recognition software and may include unintentional dictation errors.    Nena Polio, MD 10/23/18 2258    Nena Polio, MD 10/23/18 [phone]

## 2018-10-23 NOTE — ED Notes (Signed)
Will mix and give syrup meds per EDP Malinda's order once bentyl syrup received from pharm.

## 2018-10-23 NOTE — ED Notes (Signed)
EDP Malinda to bedside.  

## 2018-10-23 NOTE — ED Notes (Signed)
CT tech to bedside and states will come back once IV team finished.

## 2018-10-23 NOTE — ED Notes (Signed)
Per xray when pt stood for xray he got lightheaded. Waiting on remaining lab results.

## 2018-10-23 NOTE — ED Notes (Signed)
Delsa Sale RN attempting for IV.

## 2018-10-24 ENCOUNTER — Inpatient Hospital Stay: Payer: Medicaid Other

## 2018-10-24 ENCOUNTER — Other Ambulatory Visit: Payer: Self-pay

## 2018-10-24 DIAGNOSIS — I2699 Other pulmonary embolism without acute cor pulmonale: Secondary | ICD-10-CM | POA: Diagnosis not present

## 2018-10-24 DIAGNOSIS — M25562 Pain in left knee: Secondary | ICD-10-CM | POA: Diagnosis present

## 2018-10-24 DIAGNOSIS — M109 Gout, unspecified: Secondary | ICD-10-CM | POA: Diagnosis present

## 2018-10-24 DIAGNOSIS — I5022 Chronic systolic (congestive) heart failure: Secondary | ICD-10-CM | POA: Diagnosis not present

## 2018-10-24 DIAGNOSIS — K5730 Diverticulosis of large intestine without perforation or abscess without bleeding: Secondary | ICD-10-CM | POA: Diagnosis not present

## 2018-10-24 DIAGNOSIS — Z7951 Long term (current) use of inhaled steroids: Secondary | ICD-10-CM | POA: Diagnosis not present

## 2018-10-24 DIAGNOSIS — R109 Unspecified abdominal pain: Secondary | ICD-10-CM | POA: Diagnosis not present

## 2018-10-24 DIAGNOSIS — Z801 Family history of malignant neoplasm of trachea, bronchus and lung: Secondary | ICD-10-CM | POA: Diagnosis not present

## 2018-10-24 DIAGNOSIS — I4819 Other persistent atrial fibrillation: Secondary | ICD-10-CM | POA: Diagnosis not present

## 2018-10-24 DIAGNOSIS — G8929 Other chronic pain: Secondary | ICD-10-CM | POA: Diagnosis present

## 2018-10-24 DIAGNOSIS — Z20828 Contact with and (suspected) exposure to other viral communicable diseases: Secondary | ICD-10-CM | POA: Diagnosis not present

## 2018-10-24 DIAGNOSIS — I110 Hypertensive heart disease with heart failure: Secondary | ICD-10-CM | POA: Diagnosis present

## 2018-10-24 DIAGNOSIS — Z87820 Personal history of traumatic brain injury: Secondary | ICD-10-CM | POA: Diagnosis not present

## 2018-10-24 DIAGNOSIS — R1030 Lower abdominal pain, unspecified: Secondary | ICD-10-CM | POA: Diagnosis not present

## 2018-10-24 DIAGNOSIS — Z7901 Long term (current) use of anticoagulants: Secondary | ICD-10-CM | POA: Diagnosis not present

## 2018-10-24 DIAGNOSIS — G4733 Obstructive sleep apnea (adult) (pediatric): Secondary | ICD-10-CM | POA: Diagnosis present

## 2018-10-24 DIAGNOSIS — Z79890 Hormone replacement therapy (postmenopausal): Secondary | ICD-10-CM | POA: Diagnosis not present

## 2018-10-24 DIAGNOSIS — Z8249 Family history of ischemic heart disease and other diseases of the circulatory system: Secondary | ICD-10-CM | POA: Diagnosis not present

## 2018-10-24 DIAGNOSIS — R7989 Other specified abnormal findings of blood chemistry: Secondary | ICD-10-CM | POA: Diagnosis not present

## 2018-10-24 DIAGNOSIS — R339 Retention of urine, unspecified: Secondary | ICD-10-CM | POA: Diagnosis not present

## 2018-10-24 DIAGNOSIS — R079 Chest pain, unspecified: Secondary | ICD-10-CM | POA: Diagnosis not present

## 2018-10-24 DIAGNOSIS — E669 Obesity, unspecified: Secondary | ICD-10-CM | POA: Diagnosis present

## 2018-10-24 DIAGNOSIS — J449 Chronic obstructive pulmonary disease, unspecified: Secondary | ICD-10-CM | POA: Diagnosis not present

## 2018-10-24 DIAGNOSIS — Z6841 Body Mass Index (BMI) 40.0 and over, adult: Secondary | ICD-10-CM | POA: Diagnosis not present

## 2018-10-24 DIAGNOSIS — M25561 Pain in right knee: Secondary | ICD-10-CM | POA: Diagnosis present

## 2018-10-24 LAB — URINALYSIS, COMPLETE (UACMP) WITH MICROSCOPIC
Bacteria, UA: NONE SEEN
Bilirubin Urine: NEGATIVE
Glucose, UA: NEGATIVE mg/dL
Hgb urine dipstick: NEGATIVE
Ketones, ur: NEGATIVE mg/dL
Nitrite: NEGATIVE
Protein, ur: 30 mg/dL — AB
Specific Gravity, Urine: >1.046 — ABNORMAL HIGH (ref 1.005–1.030)
pH: 6 (ref 5.0–8.0)

## 2018-10-24 LAB — BRAIN NATRIURETIC PEPTIDE: B Natriuretic Peptide: 157 pg/mL — ABNORMAL HIGH (ref 0.0–100.0)

## 2018-10-24 LAB — TROPONIN I (HIGH SENSITIVITY)
Troponin I (High Sensitivity): 29 ng/L — ABNORMAL HIGH (ref <18)
Troponin I (High Sensitivity): 27 ng/L — ABNORMAL HIGH (ref <18)

## 2018-10-24 LAB — BASIC METABOLIC PANEL
Anion gap: 12 (ref 5–15)
BUN: 12 mg/dL (ref 6–20)
CO2: 27 mmol/L (ref 22–32)
Calcium: 8.7 mg/dL — ABNORMAL LOW (ref 8.9–10.3)
Chloride: 98 mmol/L (ref 98–111)
Creatinine, Ser: 1.25 mg/dL — ABNORMAL HIGH (ref 0.61–1.24)
GFR calc Af Amer: >60 mL/min (ref >60)
GFR calc non Af Amer: >60 mL/min (ref >60)
Glucose, Bld: 104 mg/dL — ABNORMAL HIGH (ref 70–99)
Potassium: 3.4 mmol/L — ABNORMAL LOW (ref 3.5–5.1)
Sodium: 137 mmol/L (ref 135–145)

## 2018-10-24 LAB — CBC
HCT: 37.3 % — ABNORMAL LOW (ref 39.0–52.0)
Hemoglobin: 12.1 g/dL — ABNORMAL LOW (ref 13.0–17.0)
MCH: 29.9 pg (ref 26.0–34.0)
MCHC: 32.4 g/dL (ref 30.0–36.0)
MCV: 92.1 fL (ref 80.0–100.0)
Platelets: 242 10*3/uL (ref 150–400)
RBC: 4.05 MIL/uL — ABNORMAL LOW (ref 4.22–5.81)
RDW: 17.2 % — ABNORMAL HIGH (ref 11.5–15.5)
WBC: 8 10*3/uL (ref 4.0–10.5)
nRBC: 0 % (ref 0.0–0.2)

## 2018-10-24 LAB — SARS CORONAVIRUS 2 BY RT PCR (HOSPITAL ORDER, PERFORMED IN ~~LOC~~ HOSPITAL LAB): SARS Coronavirus 2: NEGATIVE

## 2018-10-24 LAB — HEPARIN LEVEL (UNFRACTIONATED): Heparin Unfractionated: >3.6 IU/mL — ABNORMAL HIGH (ref 0.30–0.70)

## 2018-10-24 MED ORDER — AMIODARONE HCL 200 MG PO TABS
200.0000 mg | ORAL_TABLET | Freq: Every day | ORAL | Status: DC
  Administered 2018-10-24 – 2018-10-28 (×5): 200 mg via ORAL
  Filled 2018-10-24 (×5): qty 1

## 2018-10-24 MED ORDER — WARFARIN - PHARMACIST DOSING INPATIENT
Freq: Every day | Status: DC
  Administered 2018-10-26: 17:00:00

## 2018-10-24 MED ORDER — SODIUM CHLORIDE 0.9% FLUSH: 10.0000 mL | INTRAVENOUS | Status: DC | PRN

## 2018-10-24 MED ORDER — ACETAMINOPHEN 650 MG RE SUPP: 650.0000 mg | Freq: Four times a day (QID) | RECTAL | Status: DC | PRN

## 2018-10-24 MED ORDER — MORPHINE SULFATE (PF) 4 MG/ML IV SOLN
4.0000 mg | Freq: Once | INTRAVENOUS | Status: AC
  Administered 2018-10-24: 01:00:00 4 mg via INTRAVENOUS
  Filled 2018-10-24: qty 1

## 2018-10-24 MED ORDER — OXYCODONE HCL 5 MG PO TABS
10.0000 mg | ORAL_TABLET | ORAL | Status: DC | PRN
  Administered 2018-10-24 – 2018-10-28 (×15): 10 mg via ORAL
  Filled 2018-10-24 (×15): qty 2

## 2018-10-24 MED ORDER — WARFARIN SODIUM 10 MG PO TABS
10.0000 mg | ORAL_TABLET | Freq: Once | ORAL | Status: AC
  Administered 2018-10-24: 10 mg via ORAL
  Filled 2018-10-24: qty 1

## 2018-10-24 MED ORDER — IOHEXOL 350 MG/ML SOLN
100.0000 mL | Freq: Once | INTRAVENOUS | Status: AC | PRN
  Administered 2018-10-24: 08:00:00 100 mL via INTRAVENOUS

## 2018-10-24 MED ORDER — TRAZODONE HCL 50 MG PO TABS
150.0000 mg | ORAL_TABLET | Freq: Every evening | ORAL | Status: DC | PRN
  Administered 2018-10-24 – 2018-10-27 (×4): 150 mg via ORAL
  Filled 2018-10-24 (×4): qty 3

## 2018-10-24 MED ORDER — ACETAMINOPHEN 325 MG PO TABS
650.0000 mg | ORAL_TABLET | Freq: Four times a day (QID) | ORAL | Status: DC | PRN
  Administered 2018-10-25 (×2): 650 mg via ORAL
  Filled 2018-10-24 (×2): qty 2

## 2018-10-24 MED ORDER — APIXABAN 5 MG PO TABS: 5.0000 mg | ORAL_TABLET | Freq: Two times a day (BID) | ORAL | Status: DC

## 2018-10-24 MED ORDER — ATORVASTATIN CALCIUM 20 MG PO TABS
40.0000 mg | ORAL_TABLET | Freq: Every day | ORAL | Status: DC
  Administered 2018-10-24 – 2018-10-28 (×5): 40 mg via ORAL
  Filled 2018-10-24 (×5): qty 2

## 2018-10-24 MED ORDER — LEVOTHYROXINE SODIUM 50 MCG PO TABS
50.0000 ug | ORAL_TABLET | Freq: Every day | ORAL | Status: DC
  Administered 2018-10-24 – 2018-10-28 (×5): 50 ug via ORAL
  Filled 2018-10-24 (×5): qty 1

## 2018-10-24 MED ORDER — APIXABAN 5 MG PO TABS
10.0000 mg | ORAL_TABLET | Freq: Two times a day (BID) | ORAL | Status: DC
  Administered 2018-10-24: 10 mg via ORAL
  Filled 2018-10-24: qty 2

## 2018-10-24 MED ORDER — RIVAROXABAN 20 MG PO TABS: 20.0000 mg | ORAL_TABLET | Freq: Every day | ORAL | Status: DC

## 2018-10-24 MED ORDER — ONDANSETRON HCL 4 MG/2ML IJ SOLN
4.0000 mg | Freq: Four times a day (QID) | INTRAMUSCULAR | Status: DC | PRN
  Administered 2018-10-24: 01:00:00 4 mg via INTRAVENOUS
  Filled 2018-10-24: qty 2

## 2018-10-24 MED ORDER — DULOXETINE HCL 20 MG PO CPEP
20.0000 mg | ORAL_CAPSULE | Freq: Every day | ORAL | Status: DC
  Administered 2018-10-24 – 2018-10-28 (×5): 20 mg via ORAL
  Filled 2018-10-24 (×5): qty 1

## 2018-10-24 MED ORDER — SPIRONOLACTONE 25 MG PO TABS
25.0000 mg | ORAL_TABLET | Freq: Every day | ORAL | Status: DC
  Administered 2018-10-24 – 2018-10-28 (×5): 25 mg via ORAL
  Filled 2018-10-24 (×5): qty 1

## 2018-10-24 MED ORDER — OXYCODONE HCL 5 MG PO TABS
5.0000 mg | ORAL_TABLET | ORAL | Status: DC | PRN
  Administered 2018-10-24 (×3): 5 mg via ORAL
  Filled 2018-10-24 (×4): qty 1

## 2018-10-24 MED ORDER — HEPARIN (PORCINE) 25000 UT/250ML-% IV SOLN
2150.0000 [IU]/h | INTRAVENOUS | Status: DC
  Administered 2018-10-24: 1800 [IU]/h via INTRAVENOUS
  Administered 2018-10-25: 2250 [IU]/h via INTRAVENOUS
  Administered 2018-10-25: 07:00:00 2050 [IU]/h via INTRAVENOUS
  Administered 2018-10-26 – 2018-10-27 (×4): 2300 [IU]/h via INTRAVENOUS
  Filled 2018-10-24 (×7): qty 250

## 2018-10-24 MED ORDER — PANTOPRAZOLE SODIUM 40 MG PO TBEC
40.0000 mg | DELAYED_RELEASE_TABLET | Freq: Two times a day (BID) | ORAL | Status: DC
  Administered 2018-10-24 – 2018-10-28 (×9): 40 mg via ORAL
  Filled 2018-10-24 (×9): qty 1

## 2018-10-24 MED ORDER — ONDANSETRON HCL 4 MG/2ML IJ SOLN
4.0000 mg | Freq: Four times a day (QID) | INTRAMUSCULAR | Status: DC | PRN
  Administered 2018-10-24: 4 mg via INTRAVENOUS
  Filled 2018-10-24 (×2): qty 2

## 2018-10-24 MED ORDER — TORSEMIDE 20 MG PO TABS
40.0000 mg | ORAL_TABLET | Freq: Every day | ORAL | Status: DC
  Administered 2018-10-24 – 2018-10-28 (×5): 40 mg via ORAL
  Filled 2018-10-24 (×5): qty 2

## 2018-10-24 MED ORDER — ONDANSETRON HCL 4 MG PO TABS: 4.0000 mg | ORAL_TABLET | Freq: Four times a day (QID) | ORAL | Status: DC | PRN

## 2018-10-24 NOTE — ED Notes (Signed)
ED TO INPATIENT HANDOFF REPORT  ED Nurse Name and Phone #: gracie  S Name/Age/Gender [name] [name] 58 y.o. male Room/Bed: ED02A/ED02A  Code Status   Code Status: Prior  Home/SNF/Other Home Patient oriented to: self, place, time and situation Is this baseline? Yes   Triage Complete: Triage complete  Chief Complaint chest pain ems  Triage Note C/O left sided chest pain onset today at noon.  Also c/o nausea.  4 mg Zofran IM given by EMS PTA.  Per EMS, vs wnl.   Allergies No Known Allergies  Level of Care/Admitting Diagnosis ED Disposition    ED Disposition Condition Comment   Admit  Hospital Area: [facility] [[id number]]  Level of Care: Telemetry [5]  Covid Evaluation: Asymptomatic Screening Protocol (No Symptoms)  Diagnosis: Chest pain [161096]  Admitting Physician: Oralia Manis [[id number]]  Attending Physician: Oralia Manis [[id number]]  Estimated length of stay: past midnight tomorrow  Certification:: I certify this patient will need inpatient services for at least 2 midnights  Bed request comments: 2a  PT Class (Do Not Modify): Inpatient [101]  PT Acc Code (Do Not Modify): Private [1]       B Medical/Surgery History Past Medical History:  Diagnosis Date  . Asthma   . Brain damage   . Chronic pain of both knees 07/13/2018  . COPD (chronic obstructive pulmonary disease) (HCC)   . Depression   . HFrEF (heart failure with reduced ejection fraction) (HCC)    a. 03/2018 Echo: EF 25-30%, diff HK. Mod dil LA.  [name] Hypertension   . Neck pain 07/13/2018  . Persistent atrial fibrillation    a. 03/2018 s/p DCCV; b. CHA2DS2VASc = 1-->Xarelto; c. 05/2018 recurrent afib-->Amio initiated;   . Sleep apnea    Past Surgical History:  Procedure Laterality Date  . CARDIOVERSION N/A 03/24/2018   Procedure: CARDIOVERSION;  Surgeon: Antonieta Iba, MD;  Location: [facility] ORS;  Service: Cardiovascular;  Laterality: N/A;  . CARDIOVERSION N/A 08/08/2018   Procedure:  CARDIOVERSION;  Surgeon: [name], MD;  Location: [facility] ORS;  Service: Cardiovascular;  Laterality: N/A;  . hearnia repair     X 3- total of two surgeries  . LEG SURGERY       A IV Location/Drains/Wounds Patient Lines/Drains/Airways Status   Active Line/Drains/Airways    Name:   Placement date:   Placement time:   Site:   Days:   Peripheral IV 08/06/18 Right Antecubital   08/06/18    2116    Antecubital   79   Wound / Incision (Open or Dehisced) 07/18/18 Non-pressure wound Leg Right;Lower;Anterior open wound, patient says has been there for 1 year.   07/18/18    1845    Leg   98   Wound / Incision (Open or Dehisced) 07/18/18 Non-pressure wound Leg Left;Posterior;Lower open wound, red tissue base   07/18/18    1846    Leg   98          Intake/Output Last 24 hours No intake or output data in the 24 hours ending 10/24/18 0348  Labs/Imaging Results for orders placed or performed during the hospital encounter of 10/23/18 (from the past 48 hour(s))  Basic metabolic panel     Status: Abnormal   Collection Time: 10/23/18  6:17 PM  Result Value Ref Range   Sodium 139 135 - 145 mmol/L   Potassium 3.9 3.5 - 5.1 mmol/L   Chloride 103 98 - 111 mmol/L   CO2 23 22 - 32 mmol/L  Glucose, Bld 124 (H) 70 - 99 mg/dL   BUN 12 6 - 20 mg/dL   Creatinine, Ser 1.12 0.61 - 1.24 mg/dL   Calcium 8.6 (L) 8.9 - 10.3 mg/dL   GFR calc non Af Amer >60 >60 mL/min   GFR calc Af Amer >60 >60 mL/min   Anion gap 13 5 - 15    Comment: Performed at [facility], [address] [place], [place] [address]  CBC     Status: Abnormal   Collection Time: 10/23/18  6:17 PM  Result Value Ref Range   WBC 7.4 4.0 - 10.5 K/uL   RBC 4.07 (L) 4.22 - 5.81 MIL/uL   Hemoglobin 11.8 (L) 13.0 - 17.0 g/dL   HCT 36.6 (L) 39.0 - 52.0 %   MCV 89.9 80.0 - 100.0 fL   MCH 29.0 26.0 - 34.0 pg   MCHC 32.2 30.0 - 36.0 g/dL   RDW 17.1 (H) 11.5 - 15.5 %   Platelets 235 150 - 400 K/uL   nRBC 0.0 0.0 - 0.2 %     Comment: Performed at [facility], [place], [place] [address]  Troponin I (High Sensitivity)     Status: Abnormal   Collection Time: 10/23/18  6:17 PM  Result Value Ref Range   Troponin I (High Sensitivity) 31 (H) <18 ng/L    Comment: (NOTE) Elevated high sensitivity troponin I (hsTnI) values and significant  changes across serial measurements may suggest ACS but many other  chronic and acute conditions are known to elevate hsTnI results.  Refer to the "Links" section for chest pain algorithms and additional  guidance. Performed at [facility], [address] [place], [place] [address]   Protime-INR     Status: None   Collection Time: 10/23/18  6:17 PM  Result Value Ref Range   Prothrombin Time 13.7 11.4 - 15.2 seconds   INR 1.1 0.8 - 1.2    Comment: (NOTE) INR goal varies based on device and disease states. Performed at [facility], [address] [place], [place] [address]   APTT     Status: None   Collection Time: 10/23/18  6:17 PM  Result Value Ref Range   aPTT 27 24 - 36 seconds    Comment: Performed at [facility], [address] [place], [place] [address]  CBC with Differential/Platelet     Status: Abnormal   Collection Time: 10/23/18  6:17 PM  Result Value Ref Range   WBC 7.2 4.0 - 10.5 K/uL   RBC 4.01 (L) 4.22 - 5.81 MIL/uL   Hemoglobin 11.8 (L) 13.0 - 17.0 g/dL   HCT 36.2 (L) 39.0 - 52.0 %   MCV 90.3 80.0 - 100.0 fL   MCH 29.4 26.0 - 34.0 pg   MCHC 32.6 30.0 - 36.0 g/dL   RDW 17.2 (H) 11.5 - 15.5 %   Platelets 258 150 - 400 K/uL   nRBC 0.0 0.0 - 0.2 %   Neutrophils Relative % 73 %   Neutro Abs 5.3 1.7 - 7.7 K/uL   Lymphocytes Relative 14 %   Lymphs Abs 1.0 0.7 - 4.0 K/uL   Monocytes Relative 10 %   Monocytes Absolute 0.7 0.1 - 1.0 K/uL   Eosinophils Relative 1 %   Eosinophils Absolute 0.1 0.0 - 0.5 K/uL   Basophils Relative 1 %   Basophils Absolute 0.1 0.0 - 0.1 K/uL   Immature Granulocytes 1  %   Abs Immature Granulocytes  0.06 0.00 - 0.07 K/uL    Comment: Performed at [facility], [address] [place], [place] [address]  Brain natriuretic peptide     Status: Abnormal   Collection Time: 10/23/18  6:17 PM  Result Value Ref Range   B Natriuretic Peptide 157.0 (H) 0.0 - 100.0 pg/mL    Comment: Performed at [facility], [address] [place], [place] [address]  Troponin I (High Sensitivity)     Status: Abnormal   Collection Time: 10/23/18  8:39 PM  Result Value Ref Range   Troponin I (High Sensitivity) 25 (H) <18 ng/L    Comment: (NOTE) Elevated high sensitivity troponin I (hsTnI) values and significant  changes across serial measurements may suggest ACS but many other  chronic and acute conditions are known to elevate hsTnI results.  Refer to the "Links" section for chest pain algorithms and additional  guidance. Performed at [facility], [address] [place], [place] [address]   Hepatic function panel     Status: Abnormal   Collection Time: 10/23/18  8:39 PM  Result Value Ref Range   Total Protein 7.5 6.5 - 8.1 g/dL   Albumin 3.6 3.5 - 5.0 g/dL   AST 34 15 - 41 U/L    Comment: HEMOLYSIS AT THIS LEVEL MAY AFFECT RESULT   ALT 16 0 - 44 U/L    Comment: HEMOLYSIS AT THIS LEVEL MAY AFFECT RESULT   Alkaline Phosphatase 79 38 - 126 U/L   Total Bilirubin 0.9 0.3 - 1.2 mg/dL    Comment: HEMOLYSIS AT THIS LEVEL MAY AFFECT RESULT   Bilirubin, Direct 0.4 (H) 0.0 - 0.2 mg/dL    Comment: HEMOLYSIS AT THIS LEVEL MAY AFFECT RESULT   Indirect Bilirubin 0.5 0.3 - 0.9 mg/dL    Comment: Performed at [facility], [address] [place], [place] [address]  Lipase, blood     Status: None   Collection Time: 10/23/18  8:39 PM  Result Value Ref Range   Lipase 24 11 - 51 U/L    Comment: Performed at [name], [address] [place], [place] [address]  Fibrin derivatives D-Dimer     Status: Abnormal   Collection Time:  10/23/18  8:39 PM  Result Value Ref Range   Fibrin derivatives D-dimer ([facility]) 1,076.77 (H) 0.00 - 499.00 ng/mL (FEU)    Comment: (NOTE) <> Exclusion of Venous Thromboembolism (VTE) - OUTPATIENT ONLY   (Emergency Department or [place])   0-499 ng/ml (FEU): With a low to intermediate pretest probability                      for VTE this test result excludes the diagnosis                      of VTE.   >499 ng/ml (FEU) : VTE not excluded; additional work up for VTE is                      required. <> Testing on Inpatients and Evaluation of Disseminated Intravascular   Coagulation (DIC) Reference Range:   0-499 ng/ml (FEU) Performed at [facility], [address] [place], [place] [address]   SARS Coronavirus 2 [facility](Hospital order, Performed in [facility] hospital lab) Nasopharyngeal Nasopharyngeal Swab     Status: None   Collection Time: 10/23/18 11:48 PM   Specimen: Nasopharyngeal Swab  Result Value Ref Range   SARS Coronavirus  2 NEGATIVE NEGATIVE    Comment: (NOTE) If result is NEGATIVE SARS-CoV-2 target nucleic acids are NOT DETECTED. The SARS-CoV-2 RNA is generally detectable in upper and lower  respiratory specimens during the acute phase of infection. The lowest  concentration of SARS-CoV-2 viral copies this assay can detect is 250  copies / mL. A negative result does not preclude SARS-CoV-2 infection  and should not be used as the sole basis for treatment or other  patient management decisions.  A negative result may occur with  improper specimen collection / handling, submission of specimen other  than nasopharyngeal swab, presence of viral mutation(s) within the  areas targeted by this assay, and inadequate number of viral copies  (<250 copies / mL). A negative result must be combined with clinical  observations, patient history, and epidemiological information. If result is POSITIVE SARS-CoV-2 target nucleic acids are DETECTED. The SARS-CoV-2 RNA is generally  detectable in upper and lower  respiratory specimens dur ing the acute phase of infection.  Positive  results are indicative of active infection with SARS-CoV-2.  Clinical  correlation with patient history and other diagnostic information is  necessary to determine patient infection status.  Positive results do  not rule out bacterial infection or co-infection with other viruses. If result is PRESUMPTIVE POSTIVE SARS-CoV-2 nucleic acids MAY BE PRESENT.   A presumptive positive result was obtained on the submitted specimen  and confirmed on repeat testing.  While 2019 novel coronavirus  (SARS-CoV-2) nucleic acids may be present in the submitted sample  additional confirmatory testing may be necessary for epidemiological  and / or clinical management purposes  to differentiate between  SARS-CoV-2 and other Sarbecovirus currently known to infect humans.  If clinically indicated additional testing with an alternate test  methodology [phone]([id number]) is advised. The SARS-CoV-2 RNA is generally  detectable in upper and lower respiratory sp ecimens during the acute  phase of infection. The expected result is Negative. Fact Sheet for Patients:  BoilerBrush.com.cyhttps://www.fda.gov/media/136312/download Fact Sheet for Healthcare Providers: https://pope.com/https://www.fda.gov/media/136313/download This test is not yet approved or cleared by the [place] FDA and has been authorized for detection and/or diagnosis of SARS-CoV-2 by FDA under an Emergency Use Authorization (EUA).  This EUA will remain in effect (meaning this test can be used) for the duration of the COVID-19 declaration under Section 564(b)(1) of the Act, 21 U.S.C. section 360bbb-3(b)(1), unless the authorization is terminated or revoked sooner. Performed at [facility], [address] [place], [place] [address]   Urinalysis, Complete w Microscopic     Status: Abnormal   Collection Time: 10/24/18  1:18 AM  Result Value Ref Range   Color, Urine YELLOW  (A) YELLOW   APPearance CLEAR (A) CLEAR   Specific Gravity, Urine >1.046 (H) 1.005 - 1.030   pH 6.0 5.0 - 8.0   Glucose, UA NEGATIVE NEGATIVE mg/dL   Hgb urine dipstick NEGATIVE NEGATIVE   Bilirubin Urine NEGATIVE NEGATIVE   Ketones, ur NEGATIVE NEGATIVE mg/dL   Protein, ur 30 (A) NEGATIVE mg/dL   Nitrite NEGATIVE NEGATIVE   Leukocytes,Ua TRACE (A) NEGATIVE   RBC / HPF 6-10 0 - 5 RBC/hpf   WBC, UA 6-10 0 - 5 WBC/hpf   Bacteria, UA NONE SEEN NONE SEEN   Squamous Epithelial / LPF 0-5 0 - 5   Mucus PRESENT     Comment: Performed at [facility], [address] [place], [place] [address]  Troponin I (High Sensitivity)     Status: Abnormal   Collection  Time: 10/24/18  2:08 AM  Result Value Ref Range   Troponin I (High Sensitivity) 29 (H) <18 ng/L    Comment: (NOTE) Elevated high sensitivity troponin I (hsTnI) values and significant  changes across serial measurements may suggest ACS but many other  chronic and acute conditions are known to elevate hsTnI results.  Refer to the "Links" section for chest pain algorithms and additional  guidance. Performed at [facility], [address] [place], [place] [address]    Dg Chest 2 View  Result Date: 10/23/2018 CLINICAL DATA:  LEFT side chest pain beginning at noon today, nausea, history COPD, atrial fibrillation, heart failure EXAM: CHEST - 2 VIEW COMPARISON:  08/06/2018 FINDINGS: Borderline enlargement of cardiac silhouette. Mediastinal contours and pulmonary vascularity normal. Lungs clear. No pleural effusion or pneumothorax. Bones unremarkable. IMPRESSION: No acute abnormalities. Electronically Signed   By: Ulyses Southward M.D.   On: 10/23/2018 18:52   Ct Abdomen Pelvis W Contrast  Result Date: 10/23/2018 CLINICAL DATA:  Initial evaluation for acute generalized abdominal pain. EXAM: CT ABDOMEN AND PELVIS WITH CONTRAST TECHNIQUE: Multidetector CT imaging of the abdomen and pelvis was performed using the standard protocol  following bolus administration of intravenous contrast. CONTRAST:  OMNIPAQUE IOHEXOL 300 MG/ML  SOLN COMPARISON:  None available. FINDINGS: Lower chest: Visualized lung bases are clear. Hepatobiliary: Liver demonstrates a normal contrast enhanced appearance. Gallbladder within normal limits. No biliary dilatation. Pancreas: Mild diffuse fatty infiltration the pancreas noted. Pancreas otherwise unremarkable without acute peripancreatic inflammation. No abnormal pancreatic ductal dilatation. Spleen: Spleen within normal limits. Adrenals/Urinary Tract: Adrenal glands are normal. Kidneys somewhat atrophic in appearance but equal in size with symmetric enhancement bilaterally. Subcentimeter hypodensity within the interpolar right kidney too small the characterize, but statistically likely reflects a small cyst. No nephrolithiasis or hydronephrosis. No focal enhancing renal mass. No hydroureter. Bladder largely decompressed without acute finding. Stomach/Bowel: Stomach within normal limits. No evidence for bowel obstruction. Appendix within normal limits. Scattered mild colonic diverticulosis without evidence for acute diverticulitis. No acute inflammatory changes seen about the bowels. Vascular/Lymphatic: Normal intravascular enhancement seen throughout the intra-abdominal aorta. Mild aorto bi-iliac atherosclerotic disease. Mesenteric vessels patent proximally. No adenopathy. Reproductive: Negative prostate. Other: No free air or fluid. Musculoskeletal: No acute osseous abnormality. No discrete lytic or blastic osseous lesions. Moderate multilevel degenerative spondylolysis noted within the visualized thoracolumbar spine. Moderate osteoarthritic changes noted about the right hip. IMPRESSION: 1. No CT evidence for acute intra-abdominal or pelvic process. 2. Mild colonic diverticulosis without evidence for acute diverticulitis. 3. Mild aorto bi-iliac atherosclerotic disease.  No aneurysm. Electronically Signed   By:  Rise Mu M.D.   On: 10/23/2018 23:29    Pending Labs [employer] (From admission, onward)    Start     Ordered   Signed and Armed forces training and education officer morning,   R     Signed and Held   Signed and Held  CBC  Tomorrow morning,   R     Signed and Held          Vitals/Pain Today's Vitals   10/23/18 2316 10/23/18 2317 10/23/18 2318 10/24/18 0340  BP: (!) 97/56   (!) 156/56  Pulse: 71 66  68  Resp:      Temp:      SpO2: 97% 100%  98%  Weight:      Height:      PainSc:   6      Isolation Precautions No active isolations  Medications Medications  sodium chloride flush (NS) 0.9 % injection 3 mL (has no administration in time range)  ondansetron (ZOFRAN) injection 4 mg (4 mg Intravenous Given 10/24/18 0115)  nitroGLYCERIN (NITROGLYN) 2 % ointment 0.5 inch (0.5 inches Topical Given 10/23/18 2010)  aspirin chewable tablet 324 mg (324 mg Oral Given 10/23/18 2009)  iohexol (OMNIPAQUE) 9 MG/ML oral solution 500 mL (500 mLs Oral Contrast Given 10/23/18 2006)  promethazine (PHENERGAN) injection 25 mg (25 mg Intramuscular Given 10/23/18 2048)  alum & mag hydroxide-simeth (MAALOX/MYLANTA) 200-200-20 MG/5ML suspension 30 mL (30 mLs Oral Given 10/23/18 2329)    And  lidocaine (XYLOCAINE) 2 % viscous mouth solution 15 mL (15 mLs Oral Given 10/23/18 2329)  dicyclomine (BENTYL) 10 MG/5ML syrup 10 mg (10 mg Oral Given 10/23/18 2330)  iohexol (OMNIPAQUE) 300 MG/ML solution 125 mL (125 mLs Intravenous Contrast Given 10/23/18 2254)  morphine 4 MG/ML injection 4 mg (4 mg Intravenous Given 10/24/18 0118)    Mobility walks with person assist Low fall risk   Focused Assessments Cardiac Assessment Handoff:  Cardiac Rhythm: Normal sinus rhythm Lab Results  Component Value Date   TROPONINI 0.04 (HH) 08/07/2018   No results found for: DDIMER Does the Patient currently have chest pain? No     R Recommendations: See Admitting Provider Note  Report given to:   Additional Notes:

## 2018-10-24 NOTE — Progress Notes (Signed)
Advanced care plan. Purpose of the Encounter: CODE STATUS Parties in Attendance: Patient Patient's Decision Capacity: Good Subjective/Patient's story: [name] [name]  is a 58 y.o. male who presents with chief complaint as above.  Patient presents the ED with a complaint of left-sided chest pain.  He has a history of significant cardiac disease, heart failure, prior clots including DVTs and PE.  He is on chronic anticoagulation.  Initial evaluation in the ED shows troponins elevated to what is around his chronic level, and CT abdomen pelvis without abnormalities.  Given his history of clots there is some concern for possibility of PE, however CTA chest was not initially ordered with his CT abdomen in the ED.  His D-dimer was significantly elevated Objective/Medical story CTA chest showed pulmonary embolism embolism Needs anticoagulation with heparin drip and Coumadin Needs INR to be followed up Goals of care determination:  Advance care directives goals of care treatment plan discussed For now patient wants everything done which include CPR, intubation ventilator with any dialysis CODE STATUS: Full code Time spent discussing advanced care planning: 16 minutes

## 2018-10-24 NOTE — Progress Notes (Signed)
Dressing to bilateral lower extremity changed per wound care orders. Pt tolerated procedure. I will continue to access.

## 2018-10-24 NOTE — Progress Notes (Signed)
Patient suppose to have STAT CTA, however per radiologist patient has to have 20 gauge PIV above the wrist. Patient a very hard stick that required ultrasound in ED. 2 unsuccessful attempts by nursing staff. IV team consulted. Patient and radiologist both updated on situation.   Update: IV nurse was able to place midline. Radiology staff made aware. Per Radiology staff they are unavailable to take patient at this time because they have another patient. Radiology staff to get patient once available.

## 2018-10-24 NOTE — Progress Notes (Signed)
[employer] -  at [facility]   PATIENT NAME: [name] MajesticKenneth Pinkus    MR#:  [id number]  DATE OF BIRTH:  November 30, 1960  SUBJECTIVE:  CHIEF COMPLAINT:   Chief Complaint  Patient presents with  . Chest Pain  Patient seen and evaluated today With complaints of chest discomfort More chest discomfort on deep breathing No complaints of any fever No abdominal pain  REVIEW OF SYSTEMS:    ROS  CONSTITUTIONAL: No documented fever. No fatigue, weakness. No weight gain, no weight loss.  EYES: No blurry or double vision.  ENT: No tinnitus. No postnasal drip. No redness of the oropharynx.  RESPIRATORY: No cough, no wheeze, no hemoptysis. No dyspnea.  CARDIOVASCULAR: Has chest pain. No orthopnea. No palpitations. No syncope.  GASTROINTESTINAL: No nausea, no vomiting or diarrhea. No abdominal pain. No melena or hematochezia.  GENITOURINARY: No dysuria or hematuria.  ENDOCRINE: No polyuria or nocturia. No heat or cold intolerance.  HEMATOLOGY: No anemia. No bruising. No bleeding.  INTEGUMENTARY: No rashes. No lesions.  MUSCULOSKELETAL: No arthritis. No swelling. No gout.  NEUROLOGIC: No numbness, tingling, or ataxia. No seizure-type activity.  PSYCHIATRIC: No anxiety. No insomnia. No ADD.   DRUG ALLERGIES:  No Known Allergies  VITALS:  Blood pressure 115/75, pulse 65, temperature 99.3 F (37.4 C), temperature source Oral, resp. rate 17, height 5\' 8"  (1.727 m), weight (!) 179.2 kg, SpO2 92 %.  PHYSICAL EXAMINATION:   Physical Exam  GENERAL:  58 y.o.-year-old patient lying in the bed with no acute distress.  EYES: Pupils equal, round, reactive to light and accommodation. No scleral icterus. Extraocular muscles intact.  HEENT: Head atraumatic, normocephalic. Oropharynx and nasopharynx clear.  NECK:  Supple, no jugular venous distention. No thyroid enlargement, no tenderness.  LUNGS: Normal breath sounds bilaterally, no wheezing, rales, rhonchi. No use of accessory muscles  of respiration.  CARDIOVASCULAR: S1, S2 normal. No murmurs, rubs, or gallops.  ABDOMEN: Soft, nontender, nondistended. Bowel sounds present. No organomegaly or mass.  EXTREMITIES: No cyanosis, clubbing  Has edema b/l.    Ace wrap bandages to the lower extremities NEUROLOGIC: Cranial nerves II through XII are intact. No focal Motor or sensory deficits b/l.   PSYCHIATRIC: The patient is alert and oriented x 3.  SKIN: No obvious rash, lesion, or ulcer.   LABORATORY PANEL:   CBC Recent Labs  Lab 10/24/18 0442  WBC 8.0  HGB 12.1*  HCT 37.3*  PLT 242   ------------------------------------------------------------------------------------------------------------------ Chemistries  Recent Labs  Lab 10/23/18 2039 10/24/18 0442  NA  --  137  K  --  3.4*  CL  --  98  CO2  --  27  GLUCOSE  --  104*  BUN  --  12  CREATININE  --  1.25*  CALCIUM  --  8.7*  AST 34  --   ALT 16  --   ALKPHOS 79  --   BILITOT 0.9  --    ------------------------------------------------------------------------------------------------------------------  Cardiac Enzymes No results for input(s): TROPONINI in the last 168 hours. ------------------------------------------------------------------------------------------------------------------  RADIOLOGY:  Dg Chest 2 View  Result Date: 10/23/2018 CLINICAL DATA:  LEFT side chest pain beginning at noon today, nausea, history COPD, atrial fibrillation, heart failure EXAM: CHEST - 2 VIEW COMPARISON:  08/06/2018 FINDINGS: Borderline enlargement of cardiac silhouette. Mediastinal contours and pulmonary vascularity normal. Lungs clear. No pleural effusion or pneumothorax. Bones unremarkable. IMPRESSION: No acute abnormalities. Electronically Signed   By: Ulyses SouthwardMark  Boles M.D.   On: 10/23/2018 18:52   Ct Angio  Chest Pe W Or Wo Contrast  Result Date: 10/24/2018 CLINICAL DATA:  Chest pain EXAM: CT ANGIOGRAPHY CHEST WITH CONTRAST TECHNIQUE: Multidetector CT imaging of the  chest was performed using the standard protocol during bolus administration of intravenous contrast. Multiplanar CT image reconstructions and MIPs were obtained to evaluate the vascular anatomy. CONTRAST:  OMNIPAQUE IOHEXOL 350 MG/ML SOLN COMPARISON:  Chest radiograph October 23, 2018 FINDINGS: Cardiovascular: There are several subsegmental branch pulmonary emboli in the left lower lobe. No more central pulmonary emboli identified. No right heart strain apparent. There is no appreciable thoracic aortic aneurysm or dissection. The visualized great vessels appear unremarkable except for scattered occasional atherosclerotic calcifications. Note that the right innominate and left common carotid arteries arise as a common trunk, an anatomic variant. There is aortic atherosclerosis as well as foci of coronary artery calcification. There is no pericardial effusion or pericardial thickening. Mediastinum/Nodes: Thyroid appears unremarkable. No adenopathy is evident. No esophageal lesions are appreciable. Lungs/Pleura: There is mild bibasilar atelectasis. There is no edema or consolidation. No pleural effusion evident. Upper Abdomen: Visualized upper abdominal structures appear unremarkable. Musculoskeletal: There is extensive arthropathy in the lower thoracic region with sclerosis in multiple lower thoracic vertebral bodies. No similar changes noted elsewhere. No lytic or destructive bone lesions. Review of the MIP images confirms the above findings. IMPRESSION: 1. There are several small peripheral pulmonary emboli in left lower lobe pulmonary arterial vessels. No more central pulmonary embolus evident. No right heart strain. 2. There is aortic atherosclerosis. No thoracic aortic aneurysm or dissection. There are foci of coronary artery calcification. 3.  Mild bibasilar atelectasis.  No edema or consolidation. 4.  No demonstrable adenopathy. 5. Extensive sclerosis involving several lower thoracic and upper lumbar  vertebral bodies. These changes may be due to advanced arthropathy. Given the degree of sclerosis in these areas, correlation with clinical prostate examination and PSA advised. Critical Value/emergent results were called by telephone at the time of interpretation on 10/24/2018 at 8:48 am to Dr. Tobi Bastos , who verbally acknowledged these results. Aortic Atherosclerosis (ICD10-I70.0). Electronically Signed   By: Bretta Bang III M.D.   On: 10/24/2018 08:48   Ct Abdomen Pelvis W Contrast  Result Date: 10/23/2018 CLINICAL DATA:  Initial evaluation for acute generalized abdominal pain. EXAM: CT ABDOMEN AND PELVIS WITH CONTRAST TECHNIQUE: Multidetector CT imaging of the abdomen and pelvis was performed using the standard protocol following bolus administration of intravenous contrast. CONTRAST:  OMNIPAQUE IOHEXOL 300 MG/ML  SOLN COMPARISON:  None available. FINDINGS: Lower chest: Visualized lung bases are clear. Hepatobiliary: Liver demonstrates a normal contrast enhanced appearance. Gallbladder within normal limits. No biliary dilatation. Pancreas: Mild diffuse fatty infiltration the pancreas noted. Pancreas otherwise unremarkable without acute peripancreatic inflammation. No abnormal pancreatic ductal dilatation. Spleen: Spleen within normal limits. Adrenals/Urinary Tract: Adrenal glands are normal. Kidneys somewhat atrophic in appearance but equal in size with symmetric enhancement bilaterally. Subcentimeter hypodensity within the interpolar right kidney too small the characterize, but statistically likely reflects a small cyst. No nephrolithiasis or hydronephrosis. No focal enhancing renal mass. No hydroureter. Bladder largely decompressed without acute finding. Stomach/Bowel: Stomach within normal limits. No evidence for bowel obstruction. Appendix within normal limits. Scattered mild colonic diverticulosis without evidence for acute diverticulitis. No acute inflammatory changes seen about the bowels.  Vascular/Lymphatic: Normal intravascular enhancement seen throughout the intra-abdominal aorta. Mild aorto bi-iliac atherosclerotic disease. Mesenteric vessels patent proximally. No adenopathy. Reproductive: Negative prostate. Other: No free air or fluid. Musculoskeletal: No acute osseous abnormality. No discrete  lytic or blastic osseous lesions. Moderate multilevel degenerative spondylolysis noted within the visualized thoracolumbar spine. Moderate osteoarthritic changes noted about the right hip. IMPRESSION: 1. No CT evidence for acute intra-abdominal or pelvic process. 2. Mild colonic diverticulosis without evidence for acute diverticulitis. 3. Mild aorto bi-iliac atherosclerotic disease.  No aneurysm. Electronically Signed   By: Jeannine Boga M.D.   On: 10/23/2018 23:29     ASSESSMENT AND PLAN:  58 year old male patient with history of chronic systolic heart failure, COPD, chronic atrial fibrillation on Xarelto for anticoagulation, hypertension, sleep apnea currently under hospitalist service for chest discomfort.  CTA chest revealed a left lung per members him  -Acute pulmonary embolism Because of increased BMI greater than 40 will start patient IV heparin drip and anticoagulated with oral Coumadin Follow-up INR Venous Doppler ultrasound of lower extremities to assess for DVT CTA chest showed no right heart strain  -Chest discomfort could be secondary to pulmonary volume Anticoagulate patient with heparin drip  -COPD appears stable Home dose inhalers  -Sleep apnea CPAP at bedtime  -Atrial fibrillation Continue anticoagulation with heparin and Coumadin Continue oral amiodarone  -Chronic systolic heart failure Continue torsemide and Aldactone   All the records are reviewed and case discussed with Care Management/Social Worker. Management plans discussed with the patient, family and they are in agreement.  CODE STATUS: Full code  DVT Prophylaxis: SCDs  TOTAL TIME TAKING  CARE OF THIS PATIENT: 45 minutes.   POSSIBLE D/C IN 2 to 3 DAYS, DEPENDING ON CLINICAL CONDITION.  Saundra Shelling M.D on 10/24/2018 at 10:37 AM  Between 7am to 6pm - Pager - [phone]  After 6pm go to [email or web address] - password [email or web address] [facility]  Office  [phone]  CC: Primary care physician; [name], DO  Note: This dictation was prepared with Dragon dictation along with smaller phrase technology. Any transcriptional errors that result from this process are unintentional.

## 2018-10-24 NOTE — Consult Note (Signed)
WOC consulted for bilateral LE wounds. Patient is known to our practice from previous admissions. Long standing history of venous stasis dx with ulcerations and management of edema with Unna's boots.  Bedside nurse to determine when last changed per [facility].  If they are loose and/or weeping through will update POC to have them removed and update orders for topical care to any open ulcerations.  WOC will replace or change Unna's boots when I am back on the [facility] campus tomorrow.  [place], [place], [place] [facility]

## 2018-10-24 NOTE — Consult Note (Addendum)
ANTICOAGULATION CONSULT NOTE - Initial Consult  Pharmacy Consult for Heparin and warfarin  Indication: pulmonary embolus  No Known Allergies  Patient Measurements: Height: 5\' 8"  (172.7 cm) Weight: (!) 395 lb (179.2 kg) IBW/kg (Calculated) : 68.4 Heparin Dosing Weight: 113.6 kg  Vital Signs: Temp: 99.3 F (37.4 C) (09/08 0740) Temp Source: Oral (09/08 0740) BP: 115/75 (09/08 0740) Pulse Rate: 65 (09/08 0740)  Labs: Recent Labs    10/23/18 1817 10/23/18 2039 10/24/18 0208 10/24/18 0442  HGB 11.8*  11.8*  --   --  12.1*  HCT 36.2*  36.6*  --   --  37.3*  PLT 258  235  --   --  242  APTT 27  --   --   --   LABPROT 13.7  --   --   --   INR 1.1  --   --   --   CREATININE 1.12  --   --  1.25*  TROPONINIHS 31* 25* 29* 27*    Estimated Creatinine Clearance: 102.7 mL/min (A) (by C-G formula based on SCr of 1.25 mg/dL (H)).   Medical History: Past Medical History:  Diagnosis Date  . Asthma   . Brain damage   . Chronic pain of both knees 07/13/2018  . COPD (chronic obstructive pulmonary disease) (HCC)   . Depression   . HFrEF (heart failure with reduced ejection fraction) (HCC)    a. 03/2018 Echo: EF 25-30%, diff HK. Mod dil LA.  [name] Hypertension   . Neck pain 07/13/2018  . Persistent atrial fibrillation    a. 03/2018 s/p DCCV; b. CHA2DS2VASc = 1-->Xarelto; c. 05/2018 recurrent afib-->Amio initiated;   . Sleep apnea     Medications:  Medications Prior to Admission  Medication Sig Dispense Refill Last Dose  . allopurinol (ZYLOPRIM) 100 MG tablet Take 1 tablet (100 mg total) by mouth daily. 30 tablet 3 10/22/2018 at Unknown time  . amiodarone (PACERONE) 200 MG tablet Take 1 tablet (200 mg total) by mouth daily. 30 tablet 0 10/22/2018 at Unknown time  . atorvastatin (LIPITOR) 40 MG tablet TAKE 1 TABLET BY MOUTH ONCE DAILY (Patient taking differently: Take 40 mg by mouth daily. ) 30 tablet 2 10/22/2018 at Unknown time  . DULoxetine (CYMBALTA) 20 MG capsule Take 20 mg by mouth  daily.   10/22/2018 at Unknown time  . ferrous sulfate 325 (65 FE) MG tablet Take 1 tablet (325 mg total) by mouth 2 (two) times daily with a meal. 60 tablet 3 10/22/2018 at Unknown time  . levothyroxine (SYNTHROID) 50 MCG tablet Take 1 tablet (50 mcg total) by mouth daily before breakfast. 30 tablet 1 10/22/2018 at Unknown time  . lisinopril (ZESTRIL) 2.5 MG tablet Take 2.5 mg by mouth daily.   10/22/2018 at Unknown time  . meloxicam (MOBIC) 15 MG tablet Take 1 tablet (15 mg total) by mouth daily. 90 tablet 1 10/22/2018 at Unknown time  . metoprolol succinate (TOPROL-XL) 50 MG 24 hr tablet Take 50 mg by mouth 2 (two) times daily. Take with or immediately following a meal.   10/22/2018 at 1800  . rivaroxaban (XARELTO) 20 MG TABS tablet Take 1 tablet (20 mg total) by mouth daily with supper. 90 tablet 0 10/22/2018 at 1800  . spironolactone (ALDACTONE) 25 MG tablet Take 1 tablet (25 mg total) by mouth daily. 30 tablet 3 10/22/2018 at Unknown time  . torsemide (DEMADEX) 20 MG tablet Take 40 mg by mouth daily.   10/22/2018 at Unknown time  .  traZODone (DESYREL) 150 MG tablet Take 1 tablet (150 mg total) by mouth at bedtime as needed for sleep. 30 tablet 2 10/22/2018 at Unknown time  . albuterol (VENTOLIN HFA) 108 (90 Base) MCG/ACT inhaler Inhale 2 puffs into the lungs every 6 (six) hours as needed for wheezing or shortness of breath. (Patient not taking: Reported on 10/24/2018) 1 Inhaler 3 Not Taking at Unknown time  . nitroGLYCERIN (NITROSTAT) 0.4 MG SL tablet Place 1 tablet (0.4 mg total) under the tongue every 5 (five) minutes as needed for chest pain. (Patient not taking: Reported on 10/17/2018) 15 tablet 12 Not Taking at Unknown time   Scheduled:  . amiodarone  200 mg Oral Daily  . atorvastatin  40 mg Oral Daily  . DULoxetine  20 mg Oral Daily  . levothyroxine  50 mcg Oral QAC breakfast  . pantoprazole  40 mg Oral BID  . sodium chloride flush  3 mL Intravenous Once  . spironolactone  25 mg Oral Daily  . torsemide  40  mg Oral Daily   Infusions:   PRN: acetaminophen **OR** acetaminophen, ondansetron **OR** ondansetron (ZOFRAN) IV, ondansetron (ZOFRAN) IV, oxyCODONE, sodium chloride flush, traZODone Anti-infectives (From admission, onward)   None      Assessment: Pharmacy consulted to start heparin for PE. BMI > 40, ISTH recommended against the use of DOAC in this pt population. Will start heparin and bridge with warfarin. Pt was on rivaroxaban PTA and received a dose of apixaban 9/8. Will use aPTT to monitor heparin and order baseline INR aPTT, and heparin level. Pt continues to have some chest pain.   Goal of Therapy:  INR 2-3 on warfarin  APTT 66-102  HL 0.3-0.7 once aptt and heparin level correlate.  Monitor platelets by anticoagulation protocol: Yes   Plan:  Heparin: Will start heparin 8 hours post apixaban dose. Pt received a dose of apixaban will not bolus.  Start heparin infusion at 1800 units/hr Check anti-Xa level in 6 hours and daily while on heparin Continue to monitor CBC daily.   Warfarin:  Because pt received a dose of apixaban 9/9 ~1000 will start warfarin at 2200 today. Will order warfarin 10 mg x 1 tonight due to BMI 60. Pt is on amiodarone which can increase INR. Daily INR ordered.   Oswald Hillock, PharmD, BCPS 10/24/2018,10:15 AM

## 2018-10-24 NOTE — H&P (Signed)
[facility] NAME: [name] [name]    MR#:  [id number]  DATE OF BIRTH:  [birth year]  DATE OF ADMISSION:  10/23/2018  PRIMARY CARE PHYSICIAN: [name], DO   REQUESTING/REFERRING PHYSICIAN: Cinda Quest, MD  CHIEF COMPLAINT:   Chief Complaint  Patient presents with  . Chest Pain    HISTORY OF PRESENT ILLNESS:  [name] [name]  is a 58 y.o. male who presents with chief complaint as above.  Patient presents the ED with a complaint of left-sided chest pain.  He has a history of significant cardiac disease, heart failure, prior clots including DVTs and PE.  He is on chronic anticoagulation.  Initial evaluation in the ED shows troponins elevated to what is around his chronic level, and CT abdomen pelvis without abnormalities.  Given his history of clots there is some concern for possibility of PE, however CTA chest was not initially ordered with his CT abdomen in the ED.  His D-dimer was significantly elevated.  Hospitalist called for admission for further evaluation  PAST MEDICAL HISTORY:   Past Medical History:  Diagnosis Date  . Asthma   . Brain damage   . Chronic pain of both knees 07/13/2018  . COPD (chronic obstructive pulmonary disease) ([place])   . Depression   . HFrEF (heart failure with reduced ejection fraction) ([place])    a. 03/2018 Echo: EF 25-30%, diff HK. Mod dil LA.  [name] Hypertension   . Neck pain 07/13/2018  . Persistent atrial fibrillation    a. 03/2018 s/p DCCV; b. CHA2DS2VASc = 1-->Xarelto; c. 05/2018 recurrent afib-->Amio initiated;   . Sleep apnea      PAST SURGICAL HISTORY:   Past Surgical History:  Procedure Laterality Date  . CARDIOVERSION N/A 03/24/2018   Procedure: CARDIOVERSION;  Surgeon: Minna Merritts, MD;  Location: [facility] ORS;  Service: Cardiovascular;  Laterality: N/A;  . CARDIOVERSION N/A 08/08/2018   Procedure: CARDIOVERSION;  Surgeon: Nelva Bush, MD;  Location: [facility] ORS;  Service:  Cardiovascular;  Laterality: N/A;  . hearnia repair     X 3- total of two surgeries  . LEG SURGERY       SOCIAL HISTORY:   Social History   Tobacco Use  . Smoking status: Never Smoker  . Smokeless tobacco: Never Used  Substance Use Topics  . Alcohol use: Never    Frequency: Never     FAMILY HISTORY:   Family History  Problem Relation Age of Onset  . Heart failure Mother   . Lung cancer Mother   . Lung cancer Father   . Heart attack Maternal Grandmother   . Heart attack Maternal Grandfather      DRUG ALLERGIES:  No Known Allergies  MEDICATIONS AT HOME:   Prior to Admission medications   Medication Sig Start Date End Date Taking? Authorizing Provider  allopurinol (ZYLOPRIM) 100 MG tablet Take 1 tablet (100 mg total) by mouth daily. 06/27/18  Yes Johnson, Megan P, DO  amiodarone (PACERONE) 200 MG tablet Take 1 tablet (200 mg total) by mouth daily. 08/11/18  Yes Epifanio Lesches, MD  atorvastatin (LIPITOR) 40 MG tablet TAKE 1 TABLET BY MOUTH ONCE DAILY Patient taking differently: Take 40 mg by mouth daily.  09/08/18  Yes Johnson, Megan P, DO  DULoxetine (CYMBALTA) 20 MG capsule Take 20 mg by mouth daily.   Yes [provider]  ferrous sulfate 325 (65 FE) MG tablet Take 1 tablet (325 mg total) by mouth 2 (two) times  daily with a meal. 07/05/18  Yes Johnson, Megan P, DO  levothyroxine (SYNTHROID) 50 MCG tablet Take 1 tablet (50 mcg total) by mouth daily before breakfast. 09/19/18  Yes Johnson, Megan P, DO  lisinopril (ZESTRIL) 2.5 MG tablet Take 2.5 mg by mouth daily.   Yes [provider]  meloxicam (MOBIC) 15 MG tablet Take 1 tablet (15 mg total) by mouth daily. 07/04/18  Yes Johnson, Megan P, DO  metoprolol succinate (TOPROL-XL) 50 MG 24 hr tablet Take 50 mg by mouth 2 (two) times daily. Take with or immediately following a meal.   Yes [provider]  rivaroxaban (XARELTO) 20 MG TABS tablet Take 1 tablet (20 mg total) by mouth daily with supper.  06/29/18  Yes End, Cristal Deerhristopher, MD  spironolactone (ALDACTONE) 25 MG tablet Take 1 tablet (25 mg total) by mouth daily. 07/04/18  Yes Johnson, Megan P, DO  torsemide (DEMADEX) 20 MG tablet Take 40 mg by mouth daily.   Yes [provider]  traZODone (DESYREL) 150 MG tablet Take 1 tablet (150 mg total) by mouth at bedtime as needed for sleep. 06/06/18  Yes Johnson, Megan P, DO  albuterol (VENTOLIN HFA) 108 (90 Base) MCG/ACT inhaler Inhale 2 puffs into the lungs every 6 (six) hours as needed for wheezing or shortness of breath. Patient not taking: Reported on 10/24/2018 07/04/18   Olevia PerchesJohnson, Megan P, DO  nitroGLYCERIN (NITROSTAT) 0.4 MG SL tablet Place 1 tablet (0.4 mg total) under the tongue every 5 (five) minutes as needed for chest pain. Patient not taking: Reported on 10/17/2018 01/18/18   Auburn BilberryPatel, Shreyang, MD    REVIEW OF SYSTEMS:  Review of Systems  Constitutional: Negative for chills, fever, malaise/fatigue and weight loss.  HENT: Negative for ear pain, hearing loss and tinnitus.   Eyes: Negative for blurred vision, double vision, pain and redness.  Respiratory: Negative for cough, hemoptysis and shortness of breath.   Cardiovascular: Positive for chest pain. Negative for palpitations, orthopnea and leg swelling.  Gastrointestinal: Negative for abdominal pain, constipation, diarrhea, nausea and vomiting.  Genitourinary: Negative for dysuria, frequency and hematuria.  Musculoskeletal: Negative for back pain, joint pain and neck pain.  Skin:       No acne, rash, or lesions  Neurological: Negative for dizziness, tremors, focal weakness and weakness.  Endo/Heme/Allergies: Negative for polydipsia. Does not bruise/bleed easily.  Psychiatric/Behavioral: Negative for depression. The patient is not nervous/anxious and does not have insomnia.      VITAL SIGNS:   Vitals:   10/23/18 2242 10/23/18 2245 10/23/18 2316 10/23/18 2317  BP:  (!) 150/90 (!) 97/56   Pulse: 67  71 66  Resp: 15 20     Temp:      SpO2: 98%  97% 100%  Weight:      Height:       Wt Readings from Last 3 Encounters:  10/23/18 (!) 179.2 kg  09/12/18 (!) 181.9 kg  08/29/18 (!) 179.2 kg    PHYSICAL EXAMINATION:  Physical Exam  Vitals reviewed. Constitutional: He is oriented to person, place, and time. He appears well-developed and well-nourished. No distress.  HENT:  Head: Normocephalic and atraumatic.  Mouth/Throat: Oropharynx is clear and moist.  Eyes: Pupils are equal, round, and reactive to light. Conjunctivae and EOM are normal. No scleral icterus.  Neck: Normal range of motion. Neck supple. No JVD present. No thyromegaly present.  Cardiovascular: Normal rate, regular rhythm and intact distal pulses. Exam reveals no gallop and no friction rub.  No murmur  heard. Respiratory: Effort normal and breath sounds normal. No respiratory distress. He has no wheezes. He has no rales.  GI: Soft. Bowel sounds are normal. He exhibits no distension. There is no abdominal tenderness.  Musculoskeletal: Normal range of motion.        General: No edema.     Comments: No arthritis, no gout  Lymphadenopathy:    He has no cervical adenopathy.  Neurological: He is alert and oriented to person, place, and time. No cranial nerve deficit.  No dysarthria, no aphasia  Skin: Skin is warm and dry. No rash noted. No erythema.  Psychiatric: He has a normal mood and affect. His behavior is normal. Judgment and thought content normal.    LABORATORY PANEL:   CBC Recent Labs  Lab 10/23/18 1817  WBC 7.2  7.4  HGB 11.8*  11.8*  HCT 36.2*  36.6*  PLT 258  235   ------------------------------------------------------------------------------------------------------------------  Chemistries  Recent Labs  Lab 10/23/18 1817 10/23/18 2039  NA 139  --   K 3.9  --   CL 103  --   CO2 23  --   GLUCOSE 124*  --   BUN 12  --   CREATININE 1.12  --   CALCIUM 8.6*  --   AST  --  34  ALT  --  16  ALKPHOS  --  79   BILITOT  --  0.9   ------------------------------------------------------------------------------------------------------------------  Cardiac Enzymes No results for input(s): TROPONINI in the last 168 hours. ------------------------------------------------------------------------------------------------------------------  RADIOLOGY:  Dg Chest 2 View  Result Date: 10/23/2018 CLINICAL DATA:  LEFT side chest pain beginning at noon today, nausea, history COPD, atrial fibrillation, heart failure EXAM: CHEST - 2 VIEW COMPARISON:  08/06/2018 FINDINGS: Borderline enlargement of cardiac silhouette. Mediastinal contours and pulmonary vascularity normal. Lungs clear. No pleural effusion or pneumothorax. Bones unremarkable. IMPRESSION: No acute abnormalities. Electronically Signed   By: Ulyses SouthwardMark  Boles M.D.   On: 10/23/2018 18:52   Ct Abdomen Pelvis W Contrast  Result Date: 10/23/2018 CLINICAL DATA:  Initial evaluation for acute generalized abdominal pain. EXAM: CT ABDOMEN AND PELVIS WITH CONTRAST TECHNIQUE: Multidetector CT imaging of the abdomen and pelvis was performed using the standard protocol following bolus administration of intravenous contrast. CONTRAST:  125mL OMNIPAQUE IOHEXOL 300 MG/ML  SOLN COMPARISON:  None available. FINDINGS: Lower chest: Visualized lung bases are clear. Hepatobiliary: Liver demonstrates a normal contrast enhanced appearance. Gallbladder within normal limits. No biliary dilatation. Pancreas: Mild diffuse fatty infiltration the pancreas noted. Pancreas otherwise unremarkable without acute peripancreatic inflammation. No abnormal pancreatic ductal dilatation. Spleen: Spleen within normal limits. Adrenals/Urinary Tract: Adrenal glands are normal. Kidneys somewhat atrophic in appearance but equal in size with symmetric enhancement bilaterally. Subcentimeter hypodensity within the interpolar right kidney too small the characterize, but statistically likely reflects a small cyst. No  nephrolithiasis or hydronephrosis. No focal enhancing renal mass. No hydroureter. Bladder largely decompressed without acute finding. Stomach/Bowel: Stomach within normal limits. No evidence for bowel obstruction. Appendix within normal limits. Scattered mild colonic diverticulosis without evidence for acute diverticulitis. No acute inflammatory changes seen about the bowels. Vascular/Lymphatic: Normal intravascular enhancement seen throughout the intra-abdominal aorta. Mild aorto bi-iliac atherosclerotic disease. Mesenteric vessels patent proximally. No adenopathy. Reproductive: Negative prostate. Other: No free air or fluid. Musculoskeletal: No acute osseous abnormality. No discrete lytic or blastic osseous lesions. Moderate multilevel degenerative spondylolysis noted within the visualized thoracolumbar spine. Moderate osteoarthritic changes noted about the right hip. IMPRESSION: 1. No CT evidence for acute intra-abdominal or pelvic  process. 2. Mild colonic diverticulosis without evidence for acute diverticulitis. 3. Mild aorto bi-iliac atherosclerotic disease.  No aneurysm. Electronically Signed   By: Rise Mu M.D.   On: 10/23/2018 23:29    EKG:   Orders placed or performed during the hospital encounter of 10/23/18  . ED EKG  . ED EKG  . Repeat EKG  . Repeat EKG    IMPRESSION AND PLAN:  Principal Problem:   Chest pain -unclear etiology upfront.  Patient's troponins are elevated to what is around his chronic baseline.  Patient does state that he gets pains like this that come and go.  Given his history there is some risk for possibility of PE, even though he is already on anticoagulation.  We will get a CTA chest, and if it is negative for PE he will likely need cardiology evaluation for his chest pain.  We will trend his cardiac enzymes Active Problems:   Chronic systolic heart failure (HCC) -continue home meds, work-up as above   COPD (chronic obstructive pulmonary disease) (HCC)  -continue home meds   Persistent atrial fibrillation -patient is in sinus rhythm tonight, he had cardioversion performed at some point based on chart review.  Continue home meds   HTN (hypertension) -home dose antihypertensives   Obstructive sleep apnea -CPAP nightly   Chart review performed and case discussed with ED provider. Labs, imaging and/or ECG reviewed by provider and discussed with patient/family. Management plans discussed with the patient and/or family.  COVID-19 status: Pending  DVT PROPHYLAXIS: Systemic anticoagulation  GI PROPHYLAXIS:  None  ADMISSION STATUS: Inpatient     CODE STATUS: Full Code Status History    Date Active Date Inactive Code Status Order ID Comments User Context   08/06/2018 1815 08/10/2018 2042 Full Code [id number]  Alford Highland, MD ED   07/18/2018 0716 07/23/2018 1827 Full Code [id number]  Arnaldo Natal, MD ED   05/29/2018 1453 06/01/2018 1931 Full Code [id number]  Bertrum Sol, MD Inpatient   03/17/2018 0042 03/25/2018 1554 Full Code [id number]  Altamese Dilling, MD ED   02/24/2018 0725 02/27/2018 1555 Full Code [id number]  Oralia Manis, MD Inpatient   01/27/2018 1345 01/29/2018 1406 Full Code [id number]  Alford Highland, MD ED   01/17/2018 1330 01/19/2018 1818 Partial Code [id number]  Milagros Loll, MD Inpatient   01/16/2018 1427 01/17/2018 1330 Full Code [id number]  Auburn Bilberry, MD ED   Advance Care Planning Activity      TOTAL TIME TAKING CARE OF THIS PATIENT: 45 minutes.   This patient was evaluated in the context of the global COVID-19 pandemic, which necessitated consideration that the patient might be at risk for infection with the SARS-CoV-2 virus that causes COVID-19. Institutional protocols and algorithms that pertain to the evaluation of patients at risk for COVID-19 are in a state of rapid change based on information released by regulatory bodies including the CDC and federal and state organizations. These policies and algorithms  were followed to the best of this provider's knowledge to date during the patient's care at this facility.  Barney Drain 10/24/2018, 12:46 AM  [facility]  Office  [phone]  CC: Primary care physician; Dorcas Carrow, DO  Note:  This document was prepared using Dragon voice recognition software and may include unintentional dictation errors.

## 2018-10-24 NOTE — Progress Notes (Signed)
Pt is complaining of 7/10 pain. MD notified to increase dose to 10mg . Orders received. I will continue to assess.

## 2018-10-24 NOTE — Progress Notes (Signed)
Patient reports that he has been depressed and having thoughts of harming himself however he does not have a plan. Patient states that he lost his wife unexpectedly in April this year. Nursing staff offered chaplin and other mental health professionals. Patient refused all. MD Marcille Blanco made aware. No new orders at this time.   [name] [name] M

## 2018-10-25 LAB — APTT
aPTT: 45 seconds — ABNORMAL HIGH (ref 24–36)
aPTT: 61 seconds — ABNORMAL HIGH (ref 24–36)
aPTT: 66 seconds — ABNORMAL HIGH (ref 24–36)

## 2018-10-25 LAB — CBC
HCT: 34.5 % — ABNORMAL LOW (ref 39.0–52.0)
HCT: 37.6 % — ABNORMAL LOW (ref 39.0–52.0)
Hemoglobin: 11.5 g/dL — ABNORMAL LOW (ref 13.0–17.0)
Hemoglobin: 12.7 g/dL — ABNORMAL LOW (ref 13.0–17.0)
MCH: 29.8 pg (ref 26.0–34.0)
MCH: 30 pg (ref 26.0–34.0)
MCHC: 33.3 g/dL (ref 30.0–36.0)
MCHC: 33.8 g/dL (ref 30.0–36.0)
MCV: 88.3 fL (ref 80.0–100.0)
MCV: 90.1 fL (ref 80.0–100.0)
Platelets: 191 10*3/uL (ref 150–400)
Platelets: 200 10*3/uL (ref 150–400)
RBC: 3.83 MIL/uL — ABNORMAL LOW (ref 4.22–5.81)
RBC: 4.26 MIL/uL (ref 4.22–5.81)
RDW: 16.3 % — ABNORMAL HIGH (ref 11.5–15.5)
RDW: 16.5 % — ABNORMAL HIGH (ref 11.5–15.5)
WBC: 5.7 10*3/uL (ref 4.0–10.5)
WBC: 6.8 10*3/uL (ref 4.0–10.5)
nRBC: 0 % (ref 0.0–0.2)
nRBC: 0.7 % — ABNORMAL HIGH (ref 0.0–0.2)

## 2018-10-25 LAB — PROTIME-INR
INR: 1.1 (ref 0.8–1.2)
Prothrombin Time: 14.4 seconds (ref 11.4–15.2)

## 2018-10-25 LAB — BASIC METABOLIC PANEL
Anion gap: 9 (ref 5–15)
BUN: 15 mg/dL (ref 6–20)
CO2: 28 mmol/L (ref 22–32)
Calcium: 8.8 mg/dL — ABNORMAL LOW (ref 8.9–10.3)
Chloride: 96 mmol/L — ABNORMAL LOW (ref 98–111)
Creatinine, Ser: 1.22 mg/dL (ref 0.61–1.24)
GFR calc Af Amer: >60 mL/min (ref >60)
GFR calc non Af Amer: >60 mL/min (ref >60)
Glucose, Bld: 124 mg/dL — ABNORMAL HIGH (ref 70–99)
Potassium: 4.4 mmol/L (ref 3.5–5.1)
Sodium: 133 mmol/L — ABNORMAL LOW (ref 135–145)

## 2018-10-25 LAB — HEPARIN LEVEL (UNFRACTIONATED): Heparin Unfractionated: 1.47 IU/mL — ABNORMAL HIGH (ref 0.30–0.70)

## 2018-10-25 MED ORDER — SODIUM CHLORIDE 0.9% FLUSH
10.0000 mL | Freq: Two times a day (BID) | INTRAVENOUS | Status: DC
  Administered 2018-10-25 – 2018-10-26 (×2): 10 mL via INTRAVENOUS

## 2018-10-25 MED ORDER — TAMSULOSIN HCL 0.4 MG PO CAPS
0.4000 mg | ORAL_CAPSULE | Freq: Every day | ORAL | Status: DC
  Administered 2018-10-25 – 2018-10-28 (×4): 0.4 mg via ORAL
  Filled 2018-10-25 (×4): qty 1

## 2018-10-25 MED ORDER — HEPARIN BOLUS VIA INFUSION
1600.0000 [IU] | Freq: Once | INTRAVENOUS | Status: AC
  Administered 2018-10-25: 1600 [IU] via INTRAVENOUS
  Filled 2018-10-25: qty 1600

## 2018-10-25 MED ORDER — WARFARIN SODIUM 6 MG PO TABS
12.0000 mg | ORAL_TABLET | Freq: Once | ORAL | Status: AC
  Administered 2018-10-25: 18:00:00 12 mg via ORAL
  Filled 2018-10-25: qty 2

## 2018-10-25 MED ORDER — HEPARIN BOLUS VIA INFUSION
3400.0000 [IU] | Freq: Once | INTRAVENOUS | Status: AC
  Administered 2018-10-25: 3400 [IU] via INTRAVENOUS
  Filled 2018-10-25: qty 3400

## 2018-10-25 NOTE — Consult Note (Signed)
[facility] Nurse wound follow up Wound type: venous stasis 1. Left lateral; 5cm x 2cm x 0.2cm; 100% ruddy red 2. Left lateral; 2cmx 2cm x 0.1cm; 100% pink/moist 3. Left lateral: 0.7cm x 0.6cm x 0.1cm; 100% pink/moist 4. Right lateral: 4cm x 7cm x 0.1cm; 100% pale pink/moist Measurement: see above Wound bed:see above  Drainage (amount, consistency, odor) serosanguinous  Periwound: intact, edema 2+, palpable distal pulses  Dressing procedure/placement/frequency: Single layer of xeroform applied over open ulcerations.  Covered by ABD pad Applied bilateral Unna's boots Patient tolerated well.   Will add change schedule as Monday/Thursday to be consistent with [facility] schedule WOC nurse to change while inpatient.   [place], [place], [place]

## 2018-10-25 NOTE — Progress Notes (Signed)
[facility] - [place] [facility]   PATIENT NAME: [name] MajesticKenneth [name]    MR#:  [id number]  DATE OF BIRTH:  [birth year]  SUBJECTIVE:  CHIEF COMPLAINT:   Chief Complaint  Patient presents with  . Chest Pain  Patient seen and evaluated today With complaints of chest discomfort Decreased chest discomfort on deep breathing No complaints of any fever No abdominal pain  REVIEW OF SYSTEMS:    ROS  CONSTITUTIONAL: No documented fever. No fatigue, weakness. No weight gain, no weight loss.  EYES: No blurry or double vision.  ENT: No tinnitus. No postnasal drip. No redness of the oropharynx.  RESPIRATORY: No cough, no wheeze, no hemoptysis. No dyspnea.  CARDIOVASCULAR: Has chest pain. No orthopnea. No palpitations. No syncope.  GASTROINTESTINAL: No nausea, no vomiting or diarrhea. No abdominal pain. No melena or hematochezia.  GENITOURINARY: No dysuria or hematuria.  ENDOCRINE: No polyuria or nocturia. No heat or cold intolerance.  HEMATOLOGY: No anemia. No bruising. No bleeding.  INTEGUMENTARY: No rashes. No lesions.  MUSCULOSKELETAL: No arthritis. No swelling. No gout.  NEUROLOGIC: No numbness, tingling, or ataxia. No seizure-type activity.  PSYCHIATRIC: No anxiety. No insomnia. No ADD.   DRUG ALLERGIES:  No Known Allergies  VITALS:  Blood pressure 122/65, pulse 74, temperature 98.7 F (37.1 C), temperature source Oral, resp. rate 19, height 5\' 8"  (1.727 m), weight (!) 183.8 kg, SpO2 100 %.  PHYSICAL EXAMINATION:   Physical Exam  GENERAL:  58 y.o.-year-old patient lying in the bed with no acute distress.  EYES: Pupils equal, round, reactive to light and accommodation. No scleral icterus. Extraocular muscles intact.  HEENT: Head atraumatic, normocephalic. Oropharynx and nasopharynx clear.  NECK:  Supple, no jugular venous distention. No thyroid enlargement, no tenderness.  LUNGS: Normal breath sounds bilaterally, no wheezing, rales, rhonchi. No use of accessory  muscles of respiration.  CARDIOVASCULAR: S1, S2 normal. No murmurs, rubs, or gallops.  ABDOMEN: Soft, nontender, nondistended. Bowel sounds present. No organomegaly or mass.  EXTREMITIES: No cyanosis, clubbing  Has edema b/l.    Ace wrap bandages to the lower extremities NEUROLOGIC: Cranial nerves II through XII are intact. No focal Motor or sensory deficits b/l.   PSYCHIATRIC: The patient is alert and oriented x 3.  SKIN: No obvious rash, lesion, or ulcer.   LABORATORY PANEL:   CBC Recent Labs  Lab 10/25/18 0623  WBC 6.8  HGB 12.7*  HCT 37.6*  PLT 191   ------------------------------------------------------------------------------------------------------------------ Chemistries  Recent Labs  Lab 10/23/18 2039  10/25/18 0006  NA  --    < > 133*  K  --    < > 4.4  CL  --    < > 96*  CO2  --    < > 28  GLUCOSE  --    < > 124*  BUN  --    < > 15  CREATININE  --    < > 1.22  CALCIUM  --    < > 8.8*  AST 34  --   --   ALT 16  --   --   ALKPHOS 79  --   --   BILITOT 0.9  --   --    < > = values in this interval not displayed.   ------------------------------------------------------------------------------------------------------------------  Cardiac Enzymes No results for input(s): TROPONINI in the last 168 hours. ------------------------------------------------------------------------------------------------------------------  RADIOLOGY:  Dg Chest 2 View  Result Date: 10/23/2018 CLINICAL DATA:  LEFT side chest pain beginning at noon today, nausea, history  COPD, atrial fibrillation, heart failure EXAM: CHEST - 2 VIEW COMPARISON:  08/06/2018 FINDINGS: Borderline enlargement of cardiac silhouette. Mediastinal contours and pulmonary vascularity normal. Lungs clear. No pleural effusion or pneumothorax. Bones unremarkable. IMPRESSION: No acute abnormalities. Electronically Signed   By: Lavonia Dana M.D.   On: 10/23/2018 18:52   Ct Angio Chest Pe W Or Wo Contrast  Result Date:  10/24/2018 CLINICAL DATA:  Chest pain EXAM: CT ANGIOGRAPHY CHEST WITH CONTRAST TECHNIQUE: Multidetector CT imaging of the chest was performed using the standard protocol during bolus administration of intravenous contrast. Multiplanar CT image reconstructions and MIPs were obtained to evaluate the vascular anatomy. CONTRAST:  180mL OMNIPAQUE IOHEXOL 350 MG/ML SOLN COMPARISON:  Chest radiograph October 23, 2018 FINDINGS: Cardiovascular: There are several subsegmental branch pulmonary emboli in the left lower lobe. No more central pulmonary emboli identified. No right heart strain apparent. There is no appreciable thoracic aortic aneurysm or dissection. The visualized great vessels appear unremarkable except for scattered occasional atherosclerotic calcifications. Note that the right innominate and left common carotid arteries arise as a common trunk, an anatomic variant. There is aortic atherosclerosis as well as foci of coronary artery calcification. There is no pericardial effusion or pericardial thickening. Mediastinum/Nodes: Thyroid appears unremarkable. No adenopathy is evident. No esophageal lesions are appreciable. Lungs/Pleura: There is mild bibasilar atelectasis. There is no edema or consolidation. No pleural effusion evident. Upper Abdomen: Visualized upper abdominal structures appear unremarkable. Musculoskeletal: There is extensive arthropathy in the lower thoracic region with sclerosis in multiple lower thoracic vertebral bodies. No similar changes noted elsewhere. No lytic or destructive bone lesions. Review of the MIP images confirms the above findings. IMPRESSION: 1. There are several small peripheral pulmonary emboli in left lower lobe pulmonary arterial vessels. No more central pulmonary embolus evident. No right heart strain. 2. There is aortic atherosclerosis. No thoracic aortic aneurysm or dissection. There are foci of coronary artery calcification. 3.  Mild bibasilar atelectasis.  No edema or  consolidation. 4.  No demonstrable adenopathy. 5. Extensive sclerosis involving several lower thoracic and upper lumbar vertebral bodies. These changes may be due to advanced arthropathy. Given the degree of sclerosis in these areas, correlation with clinical prostate examination and PSA advised. Critical Value/emergent results were called by telephone at the time of interpretation on 10/24/2018 at 8:48 am to Dr. Estanislado Pandy , who verbally acknowledged these results. Aortic Atherosclerosis (ICD10-I70.0). Electronically Signed   By: Lowella Grip III M.D.   On: 10/24/2018 08:48   Ct Abdomen Pelvis W Contrast  Result Date: 10/23/2018 CLINICAL DATA:  Initial evaluation for acute generalized abdominal pain. EXAM: CT ABDOMEN AND PELVIS WITH CONTRAST TECHNIQUE: Multidetector CT imaging of the abdomen and pelvis was performed using the standard protocol following bolus administration of intravenous contrast. CONTRAST:  128mL OMNIPAQUE IOHEXOL 300 MG/ML  SOLN COMPARISON:  None available. FINDINGS: Lower chest: Visualized lung bases are clear. Hepatobiliary: Liver demonstrates a normal contrast enhanced appearance. Gallbladder within normal limits. No biliary dilatation. Pancreas: Mild diffuse fatty infiltration the pancreas noted. Pancreas otherwise unremarkable without acute peripancreatic inflammation. No abnormal pancreatic ductal dilatation. Spleen: Spleen within normal limits. Adrenals/Urinary Tract: Adrenal glands are normal. Kidneys somewhat atrophic in appearance but equal in size with symmetric enhancement bilaterally. Subcentimeter hypodensity within the interpolar right kidney too small the characterize, but statistically likely reflects a small cyst. No nephrolithiasis or hydronephrosis. No focal enhancing renal mass. No hydroureter. Bladder largely decompressed without acute finding. Stomach/Bowel: Stomach within normal limits. No evidence for bowel obstruction. Appendix within  normal limits. Scattered mild  colonic diverticulosis without evidence for acute diverticulitis. No acute inflammatory changes seen about the bowels. Vascular/Lymphatic: Normal intravascular enhancement seen throughout the intra-abdominal aorta. Mild aorto bi-iliac atherosclerotic disease. Mesenteric vessels patent proximally. No adenopathy. Reproductive: Negative prostate. Other: No free air or fluid. Musculoskeletal: No acute osseous abnormality. No discrete lytic or blastic osseous lesions. Moderate multilevel degenerative spondylolysis noted within the visualized thoracolumbar spine. Moderate osteoarthritic changes noted about the right hip. IMPRESSION: 1. No CT evidence for acute intra-abdominal or pelvic process. 2. Mild colonic diverticulosis without evidence for acute diverticulitis. 3. Mild aorto bi-iliac atherosclerotic disease.  No aneurysm. Electronically Signed   By: Rise MuBenjamin  McClintock M.D.   On: 10/23/2018 23:29   [place] Venous Img Lower Bilateral  Result Date: 10/24/2018 CLINICAL DATA:  History of DVT and pulmonary embolism. Elevated D-dimer. Evaluate for acute or chronic DVT. EXAM: BILATERAL LOWER EXTREMITY VENOUS DOPPLER ULTRASOUND TECHNIQUE: Gray-scale sonography with graded compression, as well as color Doppler and duplex ultrasound were performed to evaluate the lower extremity deep venous systems from the level of the common femoral vein and including the common femoral, femoral, profunda femoral, popliteal and calf veins including the posterior tibial, peroneal and gastrocnemius veins when visible. The superficial great saphenous vein was also interrogated. Spectral Doppler was utilized to evaluate flow at rest and with distal augmentation maneuvers in the common femoral, femoral and popliteal veins. COMPARISON:  None. FINDINGS: Examination is degraded due to patient body habitus and poor sonographic window. RIGHT LOWER EXTREMITY Common Femoral Vein: No evidence of thrombus. Normal compressibility, respiratory phasicity and  response to augmentation. Saphenofemoral Junction: No evidence of thrombus. Normal compressibility and flow on color Doppler imaging. Profunda Femoral Vein: No evidence of thrombus. Normal compressibility and flow on color Doppler imaging. Femoral Vein: No evidence of thrombus. Normal compressibility, respiratory phasicity and response to augmentation. Popliteal Vein: No evidence of thrombus. Normal compressibility, respiratory phasicity and response to augmentation. Calf Veins: No evidence of thrombus. Normal compressibility and flow on color Doppler imaging. Superficial Great Saphenous Vein: No evidence of thrombus. Normal compressibility. Venous Reflux:  None. Other Findings:  None. LEFT LOWER EXTREMITY Common Femoral Vein: No evidence of thrombus. Normal compressibility, respiratory phasicity and response to augmentation. Saphenofemoral Junction: No evidence of thrombus. Normal compressibility and flow on color Doppler imaging. Profunda Femoral Vein: No evidence of thrombus. Normal compressibility and flow on color Doppler imaging. Femoral Vein: No evidence of thrombus. Normal compressibility, respiratory phasicity and response to augmentation. Popliteal Vein: No evidence of thrombus. Normal compressibility, respiratory phasicity and response to augmentation. Calf Veins: No evidence of thrombus. Normal compressibility and flow on color Doppler imaging. Superficial Great Saphenous Vein: No evidence of thrombus. Normal compressibility. Venous Reflux:  None. Other Findings:  None. IMPRESSION: No evidence acute or chronic deep venous thrombosis within either lower extremity. Electronically Signed   By: Simonne ComeJohn  Watts M.D.   On: 10/24/2018 15:07     ASSESSMENT AND PLAN:  58 year old male patient with history of chronic systolic heart failure, COPD, chronic atrial fibrillation on Xarelto for anticoagulation, hypertension, sleep apnea currently under hospitalist service for chest discomfort.  CTA chest revealed a  left lung per members him  -Acute pulmonary embolism Because of increased BMI greater than 40 will started patient IV heparin drip and anticoagulated with oral Coumadin INR still subtherapeutic Follow-up INR Venous Doppler ultrasound of lower extremities no dvt CTA chest showed no right heart strain  -Chest discomfort could be secondary to pulmonary embolism Anticoagulate patient with  heparin drip  -COPD appears stable Home dose inhalers  -Sleep apnea CPAP at bedtime  -Atrial fibrillation Continue anticoagulation with heparin and Coumadin Continue oral amiodarone  -Chronic systolic heart failure Continue torsemide and Aldactone  -Urinary retention Start flomax   All the records are reviewed and case discussed with Care Management/Social Worker. Management plans discussed with the patient, family and they are in agreement.  CODE STATUS: Full code  DVT Prophylaxis: SCDs  TOTAL TIME TAKING CARE OF THIS PATIENT: 45 minutes.   POSSIBLE D/C IN 2 to 3 DAYS, DEPENDING ON CLINICAL CONDITION.  Ihor [name] M.D on 10/25/2018 at 1:24 PM  Between 7am to 6pm - Pager - [phone]  After 6pm go to [email or web address] - password [email or web address] [facility]  [facility]  Office  [phone]  CC: Primary care physician; Dorcas Carrow, DO  Note: This dictation was prepared with Dragon dictation along with smaller phrase technology. Any transcriptional errors that result from this process are unintentional.

## 2018-10-25 NOTE — TOC Initial Note (Signed)
Transition of Care [facility]) - Initial/Assessment Note    Patient Details  Name: [name] [name] MRN: [id number] Date of Birth: 1960-04-18  Transition of Care [facility]) CM/SW Contact:    Sherren Kerns, RN Phone Number: 10/25/2018, 3:24 PM  Clinical Narrative:   RNCM to patient room for readmission assessment.  Patient is currently sleeping soundly with CPAP in place.  Will assess at a later time.                        Patient Goals and CMS Choice        Expected Discharge Plan and Services                                                Prior Living Arrangements/Services                       Activities of Daily Living Home Assistive Devices/Equipment: CPAP, Walker (specify type), Wheelchair ADL Screening (condition at time of admission) Patient's cognitive ability adequate to safely complete daily activities?: Yes Is the patient deaf or have difficulty hearing?: No Does the patient have difficulty seeing, even when wearing glasses/contacts?: No Does the patient have difficulty concentrating, remembering, or making decisions?: No Patient able to express need for assistance with ADLs?: Yes Does the patient have difficulty dressing or bathing?: No Independently performs ADLs?: Yes (appropriate for developmental age) Does the patient have difficulty walking or climbing stairs?: No Weakness of Legs: Both Weakness of Arms/Hands: Both  Permission Sought/Granted                  Emotional Assessment              Admission diagnosis:  Lower abdominal pain [R10.30] Chest pain, unspecified type [R07.9] Patient Active Problem List   Diagnosis Date Noted  . Depression, major, single episode, moderate (HCC) 09/17/2018  . Chest pain 08/06/2018  . Acute on chronic systolic CHF (congestive heart failure) (HCC) 07/18/2018  . Chronic atrial fibrillation   . Personal history of DVT (deep vein thrombosis) 07/13/2018  . History of pulmonary embolism (on  Xarelto) 07/13/2018  . Chronic bilateral low back pain with bilateral sciatica 07/13/2018  . TBI (traumatic brain injury) (HCC) 07/04/2018  . Anemia 06/27/2018  . Abnormal thyroid blood test 06/27/2018  . Diet-controlled diabetes mellitus (HCC) 06/06/2018  . HTN (hypertension) 06/06/2018  . Gout 06/06/2018  . Morbid obesity (HCC) 06/06/2018  . Chronic pain 06/06/2018  . Ulcers of both lower extremities, limited to breakdown of skin (HCC) 06/06/2018  . Persistent atrial fibrillation   . Chronic systolic heart failure (HCC) 02/02/2018  . COPD (chronic obstructive pulmonary disease) (HCC) 02/02/2018  . Obstructive sleep apnea 02/02/2018  . Acute respiratory failure with hypoxia (HCC) 01/27/2018   PCP:  Dorcas Carrow, DO Pharmacy:   [facility] - [place], [place] - [address] [place] [address] Phone: [phone] Fax: [phone]     Social Determinants of Health (SDOH) Interventions    Readmission Risk Interventions Readmission Risk Prevention Plan 10/25/2018 08/08/2018 07/23/2018  Transportation Screening Complete Complete Complete  PCP or Specialist Appt within 3-5 Days Complete Complete -  HRI or Home Care Consult Complete Complete -  Social Work Consult for Recovery Care Planning/Counseling Complete Patient refused -  Palliative Care  Screening Not Applicable Not Applicable -  Medication Review (RN Care Manager) Complete Complete -  PCP or Specialist appointment within 3-5 days of discharge - - -  [place] or [place] - - Complete  SW Recovery Care/Counseling Consult - - Complete  [place] - - Not Applicable

## 2018-10-25 NOTE — Plan of Care (Signed)
  Problem: Clinical Measurements: Goal: Ability to maintain clinical measurements within normal limits will improve Outcome: Progressing   Problem: Pain Managment: Goal: General experience of comfort will improve Outcome: Progressing   Problem: Safety: Goal: Ability to remain free from injury will improve Outcome: Progressing   Problem: Activity: Goal: Ability to tolerate increased activity will improve Outcome: Progressing   Problem: Cardiac: Goal: Ability to achieve and maintain adequate cardiovascular perfusion will improve Outcome: Progressing

## 2018-10-25 NOTE — Consult Note (Signed)
ANTICOAGULATION CONSULT NOTE - Initial Consult  Pharmacy Consult for Heparin and warfarin  Indication: pulmonary embolus  No Known Allergies  Patient Measurements: Height: 5\' 8"  (172.7 cm) Weight: (!) 405 lb 3.2 oz (183.8 kg) IBW/kg (Calculated) : 68.4 Heparin Dosing Weight: 113.6 kg  Vital Signs: Temp: 98.7 F (37.1 C) (09/09 0741) Temp Source: Oral (09/09 0741) BP: 122/65 (09/09 0741) Pulse Rate: 74 (09/09 0741)  Labs: Recent Labs    10/23/18 1817 10/23/18 2039 10/24/18 0208 10/24/18 0442 10/24/18 1049 10/25/18 0006 10/25/18 0623 10/25/18 0950  HGB 11.8*  11.8*  --   --  12.1*  --  11.5* 12.7*  --   HCT 36.2*  36.6*  --   --  37.3*  --  34.5* 37.6*  --   PLT 258  235  --   --  242  --  200 191  --   APTT 27  --   --   --   --  45*  --  61*  LABPROT 13.7  --   --   --   --   --   --  14.4  INR 1.1  --   --   --   --   --   --  1.1  HEPARINUNFRC  --   --   --   --  >3.60* 1.47*  --   --   CREATININE 1.12  --   --  1.25*  --  1.22  --   --   TROPONINIHS 31* 25* 29* 27*  --   --   --   --     Estimated Creatinine Clearance: 107 mL/min (by C-G formula based on SCr of 1.22 mg/dL).   Medical History: Past Medical History:  Diagnosis Date  . Asthma   . Brain damage   . Chronic pain of both knees 07/13/2018  . COPD (chronic obstructive pulmonary disease) (HCC)   . Depression   . HFrEF (heart failure with reduced ejection fraction) (HCC)    a. 03/2018 Echo: EF 25-30%, diff HK. Mod dil LA.  [name] Hypertension   . Neck pain 07/13/2018  . Persistent atrial fibrillation    a. 03/2018 s/p DCCV; b. CHA2DS2VASc = 1-->Xarelto; c. 05/2018 recurrent afib-->Amio initiated;   . Sleep apnea     Medications:  Medications Prior to Admission  Medication Sig Dispense Refill Last Dose  . allopurinol (ZYLOPRIM) 100 MG tablet Take 1 tablet (100 mg total) by mouth daily. 30 tablet 3 10/22/2018 at Unknown time  . amiodarone (PACERONE) 200 MG tablet Take 1 tablet (200 mg total) by mouth  daily. 30 tablet 0 10/22/2018 at Unknown time  . atorvastatin (LIPITOR) 40 MG tablet TAKE 1 TABLET BY MOUTH ONCE DAILY (Patient taking differently: Take 40 mg by mouth daily. ) 30 tablet 2 10/22/2018 at Unknown time  . DULoxetine (CYMBALTA) 20 MG capsule Take 20 mg by mouth daily.   10/22/2018 at Unknown time  . ferrous sulfate 325 (65 FE) MG tablet Take 1 tablet (325 mg total) by mouth 2 (two) times daily with a meal. 60 tablet 3 10/22/2018 at Unknown time  . levothyroxine (SYNTHROID) 50 MCG tablet Take 1 tablet (50 mcg total) by mouth daily before breakfast. 30 tablet 1 10/22/2018 at Unknown time  . lisinopril (ZESTRIL) 2.5 MG tablet Take 2.5 mg by mouth daily.   10/22/2018 at Unknown time  . meloxicam (MOBIC) 15 MG tablet Take 1 tablet (15 mg total) by mouth daily. 90 tablet 1  10/22/2018 at Unknown time  . metoprolol succinate (TOPROL-XL) 50 MG 24 hr tablet Take 50 mg by mouth 2 (two) times daily. Take with or immediately following a meal.   10/22/2018 at 1800  . rivaroxaban (XARELTO) 20 MG TABS tablet Take 1 tablet (20 mg total) by mouth daily with supper. 90 tablet 0 10/22/2018 at 1800  . spironolactone (ALDACTONE) 25 MG tablet Take 1 tablet (25 mg total) by mouth daily. 30 tablet 3 10/22/2018 at Unknown time  . torsemide (DEMADEX) 20 MG tablet Take 40 mg by mouth daily.   10/22/2018 at Unknown time  . traZODone (DESYREL) 150 MG tablet Take 1 tablet (150 mg total) by mouth at bedtime as needed for sleep. 30 tablet 2 10/22/2018 at Unknown time  . albuterol (VENTOLIN HFA) 108 (90 Base) MCG/ACT inhaler Inhale 2 puffs into the lungs every 6 (six) hours as needed for wheezing or shortness of breath. (Patient not taking: Reported on 10/24/2018) 1 Inhaler 3 Not Taking at Unknown time  . nitroGLYCERIN (NITROSTAT) 0.4 MG SL tablet Place 1 tablet (0.4 mg total) under the tongue every 5 (five) minutes as needed for chest pain. (Patient not taking: Reported on 10/17/2018) 15 tablet 12 Not Taking at Unknown time   Scheduled:  .  amiodarone  200 mg Oral Daily  . atorvastatin  40 mg Oral Daily  . DULoxetine  20 mg Oral Daily  . levothyroxine  50 mcg Oral QAC breakfast  . pantoprazole  40 mg Oral BID  . sodium chloride flush  3 mL Intravenous Once  . spironolactone  25 mg Oral Daily  . torsemide  40 mg Oral Daily  . Warfarin - Pharmacist Dosing Inpatient   Does not apply q1800   Infusions:  . heparin 2,050 Units/hr (10/25/18 0654)   PRN: acetaminophen **OR** acetaminophen, ondansetron **OR** ondansetron (ZOFRAN) IV, ondansetron (ZOFRAN) IV, oxyCODONE, sodium chloride flush, traZODone Anti-infectives (From admission, onward)   None      Assessment: Pharmacy consulted to start heparin for PE. BMI > 40, ISTH recommended against the use of DOAC in this pt population. Will start heparin and bridge with warfarin. Pt was on rivaroxaban PTA and received a dose of apixaban 9/8. Will use aPTT to monitor heparin and order baseline INR aPTT, and heparin level. Pt continues to have some chest pain.   Heparin:  9/9 0000 aPTT 45 HL 1.47  9/9 0950 aPTT 61  Date INR Warfarin Dose  9/8 1.1 (9/7) 10 mg  9/9 1.1 12 mg - ordered    Goal of Therapy:  INR 2-3 on warfarin  APTT 66-102  HL 0.3-0.7 once aptt and heparin level correlate.  Monitor platelets by anticoagulation protocol: Yes   Plan:  Heparin: APTT subtherapeutic. Will bolus w/ heparin 1600 units IV x 1 and increase rate to 2250 units/hr and will recheck aPTT at 1800, CBC daily. CBC stable.   Warfarin:  INR subtherapeutic. Will order warfarin 12 mg x 1 tonight due to BMI 60. Pt is on amiodarone which can increase INR. Daily INR ordered.   Oswald Hillock, PharmD, BCPS 10/25/2018,10:22 AM

## 2018-10-25 NOTE — Consult Note (Signed)
ANTICOAGULATION CONSULT NOTE   Pharmacy Consult for Heparin and warfarin  Indication: pulmonary embolus  No Known Allergies  Patient Measurements: Height: 5\' 8"  (172.7 cm) Weight: (!) 405 lb 3.2 oz (183.8 kg) IBW/kg (Calculated) : 68.4 Heparin Dosing Weight: 113.6 kg  Vital Signs: Temp: 98.7 F (37.1 C) (09/09 1922) Temp Source: Oral (09/09 1922) BP: 128/58 (09/09 1922) Pulse Rate: 68 (09/09 1922)  Labs: Recent Labs    10/23/18 1817 10/23/18 2039 10/24/18 0208 10/24/18 0442 10/24/18 1049 10/25/18 0006 10/25/18 0623 10/25/18 0950 10/25/18 1752  HGB 11.8*  11.8*  --   --  12.1*  --  11.5* 12.7*  --   --   HCT 36.2*  36.6*  --   --  37.3*  --  34.5* 37.6*  --   --   PLT 258  235  --   --  242  --  200 191  --   --   APTT 27  --   --   --   --  45*  --  61* 66*  LABPROT 13.7  --   --   --   --   --   --  14.4  --   INR 1.1  --   --   --   --   --   --  1.1  --   HEPARINUNFRC  --   --   --   --  >3.60* 1.47*  --   --   --   CREATININE 1.12  --   --  1.25*  --  1.22  --   --   --   TROPONINIHS 31* 25* 29* 27*  --   --   --   --   --     Estimated Creatinine Clearance: 107 mL/min (by C-G formula based on SCr of 1.22 mg/dL).   Medical History: Past Medical History:  Diagnosis Date  . Asthma   . Brain damage   . Chronic pain of both knees 07/13/2018  . COPD (chronic obstructive pulmonary disease) ([place])   . Depression   . HFrEF (heart failure with reduced ejection fraction) ([place])    a. 03/2018 Echo: EF 25-30%, diff HK. Mod dil LA.  [name] Hypertension   . Neck pain 07/13/2018  . Persistent atrial fibrillation    a. 03/2018 s/p DCCV; b. CHA2DS2VASc = 1-->Xarelto; c. 05/2018 recurrent afib-->Amio initiated;   . Sleep apnea     Medications:  Medications Prior to Admission  Medication Sig Dispense Refill Last Dose  . allopurinol (ZYLOPRIM) 100 MG tablet Take 1 tablet (100 mg total) by mouth daily. 30 tablet 3 10/22/2018 at Unknown time  . amiodarone (PACERONE) 200 MG  tablet Take 1 tablet (200 mg total) by mouth daily. 30 tablet 0 10/22/2018 at Unknown time  . atorvastatin (LIPITOR) 40 MG tablet TAKE 1 TABLET BY MOUTH ONCE DAILY (Patient taking differently: Take 40 mg by mouth daily. ) 30 tablet 2 10/22/2018 at Unknown time  . DULoxetine (CYMBALTA) 20 MG capsule Take 20 mg by mouth daily.   10/22/2018 at Unknown time  . ferrous sulfate 325 (65 FE) MG tablet Take 1 tablet (325 mg total) by mouth 2 (two) times daily with a meal. 60 tablet 3 10/22/2018 at Unknown time  . levothyroxine (SYNTHROID) 50 MCG tablet Take 1 tablet (50 mcg total) by mouth daily before breakfast. 30 tablet 1 10/22/2018 at Unknown time  . lisinopril (ZESTRIL) 2.5 MG tablet Take 2.5 mg by mouth daily.  10/22/2018 at Unknown time  . meloxicam (MOBIC) 15 MG tablet Take 1 tablet (15 mg total) by mouth daily. 90 tablet 1 10/22/2018 at Unknown time  . metoprolol succinate (TOPROL-XL) 50 MG 24 hr tablet Take 50 mg by mouth 2 (two) times daily. Take with or immediately following a meal.   10/22/2018 at 1800  . rivaroxaban (XARELTO) 20 MG TABS tablet Take 1 tablet (20 mg total) by mouth daily with supper. 90 tablet 0 10/22/2018 at 1800  . spironolactone (ALDACTONE) 25 MG tablet Take 1 tablet (25 mg total) by mouth daily. 30 tablet 3 10/22/2018 at Unknown time  . torsemide (DEMADEX) 20 MG tablet Take 40 mg by mouth daily.   10/22/2018 at Unknown time  . traZODone (DESYREL) 150 MG tablet Take 1 tablet (150 mg total) by mouth at bedtime as needed for sleep. 30 tablet 2 10/22/2018 at Unknown time  . albuterol (VENTOLIN HFA) 108 (90 Base) MCG/ACT inhaler Inhale 2 puffs into the lungs every 6 (six) hours as needed for wheezing or shortness of breath. (Patient not taking: Reported on 10/24/2018) 1 Inhaler 3 Not Taking at Unknown time  . nitroGLYCERIN (NITROSTAT) 0.4 MG SL tablet Place 1 tablet (0.4 mg total) under the tongue every 5 (five) minutes as needed for chest pain. (Patient not taking: Reported on 10/17/2018) 15 tablet 12 Not  Taking at Unknown time   Scheduled:  . amiodarone  200 mg Oral Daily  . atorvastatin  40 mg Oral Daily  . DULoxetine  20 mg Oral Daily  . levothyroxine  50 mcg Oral QAC breakfast  . pantoprazole  40 mg Oral BID  . sodium chloride flush  3 mL Intravenous Once  . spironolactone  25 mg Oral Daily  . tamsulosin  0.4 mg Oral Daily  . torsemide  40 mg Oral Daily  . Warfarin - Pharmacist Dosing Inpatient   Does not apply q1800   Infusions:  . heparin 2,250 Units/hr (10/25/18 1825)   PRN: acetaminophen **OR** acetaminophen, ondansetron **OR** ondansetron (ZOFRAN) IV, ondansetron (ZOFRAN) IV, oxyCODONE, sodium chloride flush, traZODone Anti-infectives (From admission, onward)   None      Assessment: Pharmacy consulted to start heparin for PE. BMI > 40, ISTH recommended against the use of DOAC in this pt population. Will start heparin and bridge with warfarin. Pt was on rivaroxaban PTA and received a dose of apixaban 9/8. Will use aPTT to monitor heparin and order baseline INR aPTT, and heparin level. Pt continues to have some chest pain.   Heparin:  9/9 0000 aPTT 45 HL 1.47  9/9 0950 aPTT 61  Date INR Warfarin Dose  9/8 1.1 (9/7) 10 mg  9/9 1.1 12 mg - ordered    Goal of Therapy:  INR 2-3 on warfarin  APTT 66-102  HL 0.3-0.7 once aptt and heparin level correlate.  Monitor platelets by anticoagulation protocol: Yes   Plan:  Heparin: 9/9 1752 APTT= 66 barely therapeutic. Will slightly increase heparin rate to 2300 units/hr and will recheck aPTT at 0300, CBC daily. CBC stable.   Warfarin:  9/9-INR subtherapeutic. Will order warfarin 12 mg x 1 tonight due to BMI 60. Pt is on amiodarone which can increase INR. Daily INR ordered.   Agastya Meister A, PharmD, BCPS 10/25/2018,8:14 PM

## 2018-10-25 NOTE — Consult Note (Signed)
ANTICOAGULATION CONSULT NOTE - Initial Consult  Pharmacy Consult for Heparin and warfarin  Indication: pulmonary embolus  No Known Allergies  Patient Measurements: Height: 5\' 8"  (172.7 cm) Weight: (!) 395 lb (179.2 kg) IBW/kg (Calculated) : 68.4 Heparin Dosing Weight: 113.6 kg  Vital Signs: Temp: 97.9 F (36.6 C) (09/08 1924) Temp Source: Oral (09/08 1924) BP: 116/67 (09/08 1924) Pulse Rate: 76 (09/08 1924)  Labs: Recent Labs    10/23/18 1817 10/23/18 2039 10/24/18 0208 10/24/18 0442 10/24/18 1049 10/25/18 0006  HGB 11.8*  11.8*  --   --  12.1*  --  11.5*  HCT 36.2*  36.6*  --   --  37.3*  --  34.5*  PLT 258  235  --   --  242  --  200  APTT 27  --   --   --   --  45*  LABPROT 13.7  --   --   --   --   --   INR 1.1  --   --   --   --   --   HEPARINUNFRC  --   --   --   --  >3.60* 1.47*  CREATININE 1.12  --   --  1.25*  --  1.22  TROPONINIHS 31* 25* 29* 27*  --   --     Estimated Creatinine Clearance: 105.2 mL/min (by C-G formula based on SCr of 1.22 mg/dL).   Medical History: Past Medical History:  Diagnosis Date  . Asthma   . Brain damage   . Chronic pain of both knees 07/13/2018  . COPD (chronic obstructive pulmonary disease) ([facility])   . Depression   . HFrEF (heart failure with reduced ejection fraction) ([facility])    a. 03/2018 Echo: EF 25-30%, diff HK. Mod dil LA.  [name] Hypertension   . Neck pain 07/13/2018  . Persistent atrial fibrillation    a. 03/2018 s/p DCCV; b. CHA2DS2VASc = 1-->Xarelto; c. 05/2018 recurrent afib-->Amio initiated;   . Sleep apnea     Medications:  Medications Prior to Admission  Medication Sig Dispense Refill Last Dose  . allopurinol (ZYLOPRIM) 100 MG tablet Take 1 tablet (100 mg total) by mouth daily. 30 tablet 3 10/22/2018 at Unknown time  . amiodarone (PACERONE) 200 MG tablet Take 1 tablet (200 mg total) by mouth daily. 30 tablet 0 10/22/2018 at Unknown time  . atorvastatin (LIPITOR) 40 MG tablet TAKE 1 TABLET BY MOUTH ONCE DAILY (Patient  taking differently: Take 40 mg by mouth daily. ) 30 tablet 2 10/22/2018 at Unknown time  . DULoxetine (CYMBALTA) 20 MG capsule Take 20 mg by mouth daily.   10/22/2018 at Unknown time  . ferrous sulfate 325 (65 FE) MG tablet Take 1 tablet (325 mg total) by mouth 2 (two) times daily with a meal. 60 tablet 3 10/22/2018 at Unknown time  . levothyroxine (SYNTHROID) 50 MCG tablet Take 1 tablet (50 mcg total) by mouth daily before breakfast. 30 tablet 1 10/22/2018 at Unknown time  . lisinopril (ZESTRIL) 2.5 MG tablet Take 2.5 mg by mouth daily.   10/22/2018 at Unknown time  . meloxicam (MOBIC) 15 MG tablet Take 1 tablet (15 mg total) by mouth daily. 90 tablet 1 10/22/2018 at Unknown time  . metoprolol succinate (TOPROL-XL) 50 MG 24 hr tablet Take 50 mg by mouth 2 (two) times daily. Take with or immediately following a meal.   10/22/2018 at 1800  . rivaroxaban (XARELTO) 20 MG TABS tablet Take 1 tablet (20 mg  total) by mouth daily with supper. 90 tablet 0 10/22/2018 at 1800  . spironolactone (ALDACTONE) 25 MG tablet Take 1 tablet (25 mg total) by mouth daily. 30 tablet 3 10/22/2018 at Unknown time  . torsemide (DEMADEX) 20 MG tablet Take 40 mg by mouth daily.   10/22/2018 at Unknown time  . traZODone (DESYREL) 150 MG tablet Take 1 tablet (150 mg total) by mouth at bedtime as needed for sleep. 30 tablet 2 10/22/2018 at Unknown time  . albuterol (VENTOLIN HFA) 108 (90 Base) MCG/ACT inhaler Inhale 2 puffs into the lungs every 6 (six) hours as needed for wheezing or shortness of breath. (Patient not taking: Reported on 10/24/2018) 1 Inhaler 3 Not Taking at Unknown time  . nitroGLYCERIN (NITROSTAT) 0.4 MG SL tablet Place 1 tablet (0.4 mg total) under the tongue every 5 (five) minutes as needed for chest pain. (Patient not taking: Reported on 10/17/2018) 15 tablet 12 Not Taking at Unknown time   Scheduled:  . amiodarone  200 mg Oral Daily  . atorvastatin  40 mg Oral Daily  . DULoxetine  20 mg Oral Daily  . heparin  3,400 Units Intravenous  Once  . levothyroxine  50 mcg Oral QAC breakfast  . pantoprazole  40 mg Oral BID  . sodium chloride flush  3 mL Intravenous Once  . spironolactone  25 mg Oral Daily  . torsemide  40 mg Oral Daily  . Warfarin - Pharmacist Dosing Inpatient   Does not apply q1800   Infusions:  . heparin 1,800 Units/hr (10/24/18 1830)   PRN: acetaminophen **OR** acetaminophen, ondansetron **OR** ondansetron (ZOFRAN) IV, ondansetron (ZOFRAN) IV, oxyCODONE, sodium chloride flush, traZODone Anti-infectives (From admission, onward)   None      Assessment: Pharmacy consulted to start heparin for PE. BMI > 40, ISTH recommended against the use of DOAC in this pt population. Will start heparin and bridge with warfarin. Pt was on rivaroxaban PTA and received a dose of apixaban 9/8. Will use aPTT to monitor heparin and order baseline INR aPTT, and heparin level. Pt continues to have some chest pain.   Goal of Therapy:  INR 2-3 on warfarin  APTT 66-102  HL 0.3-0.7 once aptt and heparin level correlate.  Monitor platelets by anticoagulation protocol: Yes   Plan:  Heparin: 09/09 @ 0000 aPTT 45 seconds; HL 1.47 supratherapeutic. Will bolus w/ heparin 3400 units IV x 1 and increase rate to 2050 units/hr and will recheck aPTT at 0900, CBC trending stable will continue to monitor.  Tobie Lords, PharmD, BCPS 10/25/2018,2:52 AM

## 2018-10-26 LAB — APTT
aPTT: 86 seconds — ABNORMAL HIGH (ref 24–36)
aPTT: 82 seconds — ABNORMAL HIGH (ref 24–36)

## 2018-10-26 LAB — CBC
HCT: 35.3 % — ABNORMAL LOW (ref 39.0–52.0)
Hemoglobin: 11.7 g/dL — ABNORMAL LOW (ref 13.0–17.0)
MCH: 29.8 pg (ref 26.0–34.0)
MCHC: 33.1 g/dL (ref 30.0–36.0)
MCV: 90.1 fL (ref 80.0–100.0)
Platelets: 167 10*3/uL (ref 150–400)
RBC: 3.92 MIL/uL — ABNORMAL LOW (ref 4.22–5.81)
RDW: 15.9 % — ABNORMAL HIGH (ref 11.5–15.5)
WBC: 5.7 10*3/uL (ref 4.0–10.5)
nRBC: 0 % (ref 0.0–0.2)

## 2018-10-26 LAB — HEPARIN LEVEL (UNFRACTIONATED)
Heparin Unfractionated: 0.74 IU/mL — ABNORMAL HIGH (ref 0.30–0.70)
Heparin Unfractionated: 0.76 IU/mL — ABNORMAL HIGH (ref 0.30–0.70)

## 2018-10-26 LAB — PROTIME-INR
INR: 1.4 — ABNORMAL HIGH (ref 0.8–1.2)
Prothrombin Time: 17.1 seconds — ABNORMAL HIGH (ref 11.4–15.2)

## 2018-10-26 MED ORDER — WARFARIN SODIUM 6 MG PO TABS
12.0000 mg | ORAL_TABLET | Freq: Once | ORAL | Status: AC
  Administered 2018-10-26: 17:00:00 12 mg via ORAL
  Filled 2018-10-26: qty 2

## 2018-10-26 NOTE — Consult Note (Signed)
[place] for Heparin and warfarin  Indication: pulmonary embolus  No Known Allergies  Patient Measurements: Height: 5\' 8"  (172.7 cm) Weight: (!) 406 lb 8 oz (184.4 kg) IBW/kg (Calculated) : 68.4 Heparin Dosing Weight: 113.6 kg  Vital Signs: Temp: 98.4 F (36.9 C) (09/10 0241) Temp Source: Oral (09/10 0241) BP: 117/63 (09/10 0241) Pulse Rate: 76 (09/10 0241)  Labs: Recent Labs    10/23/18 1817 10/23/18 2039 10/24/18 0208 10/24/18 0442 10/24/18 1049 10/25/18 0006 10/25/18 0623 10/25/18 0950 10/25/18 1752 10/26/18 0308  HGB 11.8*  11.8*  --   --  12.1*  --  11.5* 12.7*  --   --   --   HCT 36.2*  36.6*  --   --  37.3*  --  34.5* 37.6*  --   --   --   PLT 258  235  --   --  242  --  200 191  --   --   --   APTT 27  --   --   --   --  45*  --  61* 66* 86*  LABPROT 13.7  --   --   --   --   --   --  14.4  --   --   INR 1.1  --   --   --   --   --   --  1.1  --   --   HEPARINUNFRC  --   --   --   --  >3.60* 1.47*  --   --   --  0.74*  CREATININE 1.12  --   --  1.25*  --  1.22  --   --   --   --   TROPONINIHS 31* 25* 29* 27*  --   --   --   --   --   --     Estimated Creatinine Clearance: 107.2 mL/min (by C-G formula based on SCr of 1.22 mg/dL).   Medical History: Past Medical History:  Diagnosis Date  . Asthma   . Brain damage   . Chronic pain of both knees 07/13/2018  . COPD (chronic obstructive pulmonary disease) ([place])   . Depression   . HFrEF (heart failure with reduced ejection fraction) ([place])    a. 03/2018 Echo: EF 25-30%, diff HK. Mod dil LA.  [name] Hypertension   . Neck pain 07/13/2018  . Persistent atrial fibrillation    a. 03/2018 s/p DCCV; b. CHA2DS2VASc = 1-->Xarelto; c. 05/2018 recurrent afib-->Amio initiated;   . Sleep apnea     Medications:  Medications Prior to Admission  Medication Sig Dispense Refill Last Dose  . allopurinol (ZYLOPRIM) 100 MG tablet Take 1 tablet (100 mg total) by mouth daily. 30 tablet 3  10/22/2018 at Unknown time  . amiodarone (PACERONE) 200 MG tablet Take 1 tablet (200 mg total) by mouth daily. 30 tablet 0 10/22/2018 at Unknown time  . atorvastatin (LIPITOR) 40 MG tablet TAKE 1 TABLET BY MOUTH ONCE DAILY (Patient taking differently: Take 40 mg by mouth daily. ) 30 tablet 2 10/22/2018 at Unknown time  . DULoxetine (CYMBALTA) 20 MG capsule Take 20 mg by mouth daily.   10/22/2018 at Unknown time  . ferrous sulfate 325 (65 FE) MG tablet Take 1 tablet (325 mg total) by mouth 2 (two) times daily with a meal. 60 tablet 3 10/22/2018 at Unknown time  . levothyroxine (SYNTHROID) 50 MCG tablet Take 1 tablet (50 mcg total)  by mouth daily before breakfast. 30 tablet 1 10/22/2018 at Unknown time  . lisinopril (ZESTRIL) 2.5 MG tablet Take 2.5 mg by mouth daily.   10/22/2018 at Unknown time  . meloxicam (MOBIC) 15 MG tablet Take 1 tablet (15 mg total) by mouth daily. 90 tablet 1 10/22/2018 at Unknown time  . metoprolol succinate (TOPROL-XL) 50 MG 24 hr tablet Take 50 mg by mouth 2 (two) times daily. Take with or immediately following a meal.   10/22/2018 at 1800  . rivaroxaban (XARELTO) 20 MG TABS tablet Take 1 tablet (20 mg total) by mouth daily with supper. 90 tablet 0 10/22/2018 at 1800  . spironolactone (ALDACTONE) 25 MG tablet Take 1 tablet (25 mg total) by mouth daily. 30 tablet 3 10/22/2018 at Unknown time  . torsemide (DEMADEX) 20 MG tablet Take 40 mg by mouth daily.   10/22/2018 at Unknown time  . traZODone (DESYREL) 150 MG tablet Take 1 tablet (150 mg total) by mouth at bedtime as needed for sleep. 30 tablet 2 10/22/2018 at Unknown time  . albuterol (VENTOLIN HFA) 108 (90 Base) MCG/ACT inhaler Inhale 2 puffs into the lungs every 6 (six) hours as needed for wheezing or shortness of breath. (Patient not taking: Reported on 10/24/2018) 1 Inhaler 3 Not Taking at Unknown time  . nitroGLYCERIN (NITROSTAT) 0.4 MG SL tablet Place 1 tablet (0.4 mg total) under the tongue every 5 (five) minutes as needed for chest pain.  (Patient not taking: Reported on 10/17/2018) 15 tablet 12 Not Taking at Unknown time   Scheduled:  . amiodarone  200 mg Oral Daily  . atorvastatin  40 mg Oral Daily  . DULoxetine  20 mg Oral Daily  . levothyroxine  50 mcg Oral QAC breakfast  . pantoprazole  40 mg Oral BID  . sodium chloride flush  10 mL Intravenous Q12H  . sodium chloride flush  3 mL Intravenous Once  . spironolactone  25 mg Oral Daily  . tamsulosin  0.4 mg Oral Daily  . torsemide  40 mg Oral Daily  . Warfarin - Pharmacist Dosing Inpatient   Does not apply q1800   Infusions:  . heparin 2,300 Units/hr (10/25/18 2116)   PRN: acetaminophen **OR** acetaminophen, ondansetron **OR** ondansetron (ZOFRAN) IV, ondansetron (ZOFRAN) IV, oxyCODONE, sodium chloride flush, traZODone Anti-infectives (From admission, onward)   None      Assessment: Pharmacy consulted to start heparin for PE. BMI > 40, ISTH recommended against the use of DOAC in this pt population. Will start heparin and bridge with warfarin. Pt was on rivaroxaban PTA and received a dose of apixaban 9/8. Will use aPTT to monitor heparin and order baseline INR aPTT, and heparin level. Pt continues to have some chest pain.   Heparin:  9/9 0000 aPTT 45 HL 1.47  9/9 0950 aPTT 61  Date INR Warfarin Dose  9/8 1.1 (9/7) 10 mg  9/9 1.1 12 mg - ordered    Goal of Therapy:  INR 2-3 on warfarin  APTT 66-102  HL 0.3-0.7 once aptt and heparin level correlate.  Monitor platelets by anticoagulation protocol: Yes   Plan:  09/10 @ 0300 aPTT 86 seconds, HL 0.74 supratherapeutic. APTT therapeutic, HL still not correlating, will continue current rate and will recheck aPTT/HL @ 0900, CBC stable w/ monitor w/ am labs.  Thomasene Ripple, PharmD, BCPS 10/26/2018,5:14 AM

## 2018-10-26 NOTE — TOC Initial Note (Signed)
Transition of Care [facility]) - Initial/Assessment Note    Patient Details  Name: [name] [name] MRN: [id number] Date of Birth: November 08, 1960  [facility] CM/SW Contact:    Elza Rafter, RN Phone Number: 10/26/2018, 2:06 PM  Clinical Narrative:    Patient admitted with chest pain, history of CHF, current PE.  INR is 1.4 today.  Current with PCP.  Obtains medications without difficulty.  Drives to appointments but would like information on Medicaid transportation-information provided.  Open to [facility] is aware.  Will notify her at time of discharge to resume services.  Has a walker and CPAP machine at home.  No oxygen needs.  Has a functioning scale at home.  Will continue to assist with progressing patient.   Expected Discharge Plan: [place] Barriers to Discharge: Continued Medical Work up   Patient Goals and CMS Choice Patient states their goals for this hospitalization and ongoing recovery are:: discharge home and continue home health with [facility] CMS Medicare.gov Compare Post Acute Care list provided to:: Patient Choice offered to / list presented to : Patient  Expected Discharge Plan and Services Expected Discharge Plan: [place]   Discharge Planning Services: CM Consult, Other - See comment(Transportation needs) Post Acute Care Choice: [place] arrangements for the past 2 months: Apartment                           HH Arranged: RN, PT, Nurse's Aide [place] Agency: [facility] ([facility]) Date [place]: 10/25/18 Time [place]: 1200 Representative spoke with at [place]: [place] Arrangements/Services Living arrangements for the past 2 months: [place] with:: Self Patient language and need for interpreter reviewed:: No Do you feel safe going back to the place where you live?: Yes          Current home services: Home PT, Home RN Criminal  Activity/Legal Involvement Pertinent to Current Situation/Hospitalization: No - Comment as needed  Activities of Daily Living Home Assistive Devices/Equipment: CPAP, Walker (specify type), Wheelchair ADL Screening (condition at time of admission) Patient's cognitive ability adequate to safely complete daily activities?: Yes Is the patient deaf or have difficulty hearing?: No Does the patient have difficulty seeing, even when wearing glasses/contacts?: No Does the patient have difficulty concentrating, remembering, or making decisions?: No Patient able to express need for assistance with ADLs?: Yes Does the patient have difficulty dressing or bathing?: No Independently performs ADLs?: Yes (appropriate for developmental age) Does the patient have difficulty walking or climbing stairs?: No Weakness of Legs: Both Weakness of Arms/Hands: Both  Permission Sought/Granted Permission sought to share information with : Facility Art therapist granted to share information with : Yes, Verbal Permission Granted  Share Information with NAME: Helene Kelp  Permission granted to share info w AGENCY: [facility]        Emotional Assessment Appearance:: Appears stated age Attitude/Demeanor/Rapport: Gracious Affect (typically observed): Accepting Orientation: : Oriented to Self, Oriented to Place, Oriented to  Time, Oriented to Situation Alcohol / Substance Use: Not Applicable Psych Involvement: No (comment)  Admission diagnosis:  Lower abdominal pain [R10.30] Chest pain, unspecified type [R07.9] Patient Active Problem List   Diagnosis Date Noted  . Depression, major, single episode, moderate ([place]) 09/17/2018  . Chest pain 08/06/2018  . Acute on chronic systolic CHF (congestive heart failure) ([place]) 07/18/2018  . Chronic atrial fibrillation   .  Personal history of DVT (deep vein thrombosis) 07/13/2018  . History of pulmonary embolism (on Xarelto) 07/13/2018  . Chronic bilateral  low back pain with bilateral sciatica 07/13/2018  . TBI (traumatic brain injury) ([facility]) 07/04/2018  . Anemia 06/27/2018  . Abnormal thyroid blood test 06/27/2018  . Diet-controlled diabetes mellitus ([facility]) 06/06/2018  . HTN (hypertension) 06/06/2018  . Gout 06/06/2018  . Morbid obesity (HCC) 06/06/2018  . Chronic pain 06/06/2018  . Ulcers of both lower extremities, limited to breakdown of skin (HCC) 06/06/2018  . Persistent atrial fibrillation   . Chronic systolic heart failure (HCC) 02/02/2018  . COPD (chronic obstructive pulmonary disease) (H[facility]) 02/02/2018  . Obstructive sleep apnea 02/02/2018  . Acute respiratory failure with hypoxia (HCC) 01/27/2018   PCP:  Dorcas Carrow, DO Pharmacy:   [facility] - [place], [place] - [address] [place] [address] Phone: [phone] Fax: [phone]     Social Determinants of Health (SDOH) Interventions    Readmission Risk Interventions Readmission Risk Prevention Plan 10/25/2018 08/08/2018 07/23/2018  Transportation Screening Complete Complete Complete  PCP or Specialist Appt within 3-5 Days Complete Complete -  HRI or Home Care Consult Complete Complete -  Social Work Consult for Recovery Care Planning/Counseling Complete Patient refused -  Palliative Care Screening Not Applicable Not Applicable -  Medication Review [facility]) Complete Complete -  PCP or Specialist appointment within 3-5 days of discharge - - -  HRI or Home Care Consult - - Complete  SW Recovery Care/Counseling Consult - - Complete  Palliative Care Screening - - Not Applicable  Skilled Nursing Facility - - Not Applicable

## 2018-10-26 NOTE — Progress Notes (Signed)
[employer] - [place] [facility]   PATIENT NAME: [name] MajesticKenneth Fenley    MR#:  [id number]  DATE OF BIRTH:  1960/06/16  SUBJECTIVE:  CHIEF COMPLAINT:   Chief Complaint  Patient presents with  . Chest Pain  Patient seen and evaluated today No complaints of chest pain Has discomfort in the neck Patient is concerned that his penis is a drowned within the scrotum No complaints of any fever No abdominal pain  REVIEW OF SYSTEMS:    ROS  CONSTITUTIONAL: No documented fever. No fatigue, weakness. No weight gain, no weight loss.  EYES: No blurry or double vision.  ENT: No tinnitus. No postnasal drip. No redness of the oropharynx.  RESPIRATORY: No cough, no wheeze, no hemoptysis. No dyspnea.  CARDIOVASCULAR: Has chest pain. No orthopnea. No palpitations. No syncope.  GASTROINTESTINAL: No nausea, no vomiting or diarrhea. No abdominal pain. No melena or hematochezia.  GENITOURINARY: No dysuria or hematuria.  ENDOCRINE: No polyuria or nocturia. No heat or cold intolerance.  HEMATOLOGY: No anemia. No bruising. No bleeding.  INTEGUMENTARY: No rashes. No lesions.  MUSCULOSKELETAL: No arthritis. No swelling. No gout.  NEUROLOGIC: No numbness, tingling, or ataxia. No seizure-type activity.  PSYCHIATRIC: No anxiety. No insomnia. No ADD.   DRUG ALLERGIES:  No Known Allergies  VITALS:  Blood pressure 122/62, pulse 86, temperature 98.3 F (36.8 C), temperature source Oral, resp. rate 19, height 5\' 8"  (1.727 m), weight (!) 184.4 kg, SpO2 94 %.  PHYSICAL EXAMINATION:   Physical Exam  GENERAL:  58 y.o.-year-old patient lying in the bed with no acute distress.  EYES: Pupils equal, round, reactive to light and accommodation. No scleral icterus. Extraocular muscles intact.  HEENT: Head atraumatic, normocephalic. Oropharynx and nasopharynx clear.  NECK:  Supple, no jugular venous distention. No thyroid enlargement, no tenderness.  LUNGS: Normal breath sounds bilaterally, no  wheezing, rales, rhonchi. No use of accessory muscles of respiration.  CARDIOVASCULAR: S1, S2 normal. No murmurs, rubs, or gallops.  ABDOMEN: Soft, nontender, nondistended. Bowel sounds present. No organomegaly or mass.  EXTREMITIES: No cyanosis, clubbing  Has edema b/l.    Ace wrap bandages to the lower extremities NEUROLOGIC: Cranial nerves II through XII are intact. No focal Motor or sensory deficits b/l.   PSYCHIATRIC: The patient is alert and oriented x 3.  SKIN: No obvious rash, lesion, or ulcer.   LABORATORY PANEL:   CBC Recent Labs  Lab 10/26/18 0505  WBC 5.7  HGB 11.7*  HCT 35.3*  PLT 167   ------------------------------------------------------------------------------------------------------------------ Chemistries  Recent Labs  Lab 10/23/18 2039  10/25/18 0006  NA  --    < > 133*  K  --    < > 4.4  CL  --    < > 96*  CO2  --    < > 28  GLUCOSE  --    < > 124*  BUN  --    < > 15  CREATININE  --    < > 1.22  CALCIUM  --    < > 8.8*  AST 34  --   --   ALT 16  --   --   ALKPHOS 79  --   --   BILITOT 0.9  --   --    < > = values in this interval not displayed.   ------------------------------------------------------------------------------------------------------------------  Cardiac Enzymes No results for input(s): TROPONINI in the last 168 hours. ------------------------------------------------------------------------------------------------------------------  RADIOLOGY:  [place] Venous Img Lower Bilateral  Result Date: 10/24/2018 CLINICAL  DATA:  History of DVT and pulmonary embolism. Elevated D-dimer. Evaluate for acute or chronic DVT. EXAM: BILATERAL LOWER EXTREMITY VENOUS DOPPLER ULTRASOUND TECHNIQUE: Gray-scale sonography with graded compression, as well as color Doppler and duplex ultrasound were performed to evaluate the lower extremity deep venous systems from the level of the common femoral vein and including the common femoral, femoral, profunda femoral,  popliteal and calf veins including the posterior tibial, peroneal and gastrocnemius veins when visible. The superficial great saphenous vein was also interrogated. Spectral Doppler was utilized to evaluate flow at rest and with distal augmentation maneuvers in the common femoral, femoral and popliteal veins. COMPARISON:  None. FINDINGS: Examination is degraded due to patient body habitus and poor sonographic window. RIGHT LOWER EXTREMITY Common Femoral Vein: No evidence of thrombus. Normal compressibility, respiratory phasicity and response to augmentation. Saphenofemoral Junction: No evidence of thrombus. Normal compressibility and flow on color Doppler imaging. Profunda Femoral Vein: No evidence of thrombus. Normal compressibility and flow on color Doppler imaging. Femoral Vein: No evidence of thrombus. Normal compressibility, respiratory phasicity and response to augmentation. Popliteal Vein: No evidence of thrombus. Normal compressibility, respiratory phasicity and response to augmentation. Calf Veins: No evidence of thrombus. Normal compressibility and flow on color Doppler imaging. Superficial Great Saphenous Vein: No evidence of thrombus. Normal compressibility. Venous Reflux:  None. Other Findings:  None. LEFT LOWER EXTREMITY Common Femoral Vein: No evidence of thrombus. Normal compressibility, respiratory phasicity and response to augmentation. Saphenofemoral Junction: No evidence of thrombus. Normal compressibility and flow on color Doppler imaging. Profunda Femoral Vein: No evidence of thrombus. Normal compressibility and flow on color Doppler imaging. Femoral Vein: No evidence of thrombus. Normal compressibility, respiratory phasicity and response to augmentation. Popliteal Vein: No evidence of thrombus. Normal compressibility, respiratory phasicity and response to augmentation. Calf Veins: No evidence of thrombus. Normal compressibility and flow on color Doppler imaging. Superficial Great Saphenous  Vein: No evidence of thrombus. Normal compressibility. Venous Reflux:  None. Other Findings:  None. IMPRESSION: No evidence acute or chronic deep venous thrombosis within either lower extremity. Electronically Signed   By: Simonne Come M.D.   On: 10/24/2018 15:07     ASSESSMENT AND PLAN:  58 year old male patient with history of chronic systolic heart failure, COPD, chronic atrial fibrillation on Xarelto for anticoagulation, hypertension, sleep apnea currently under hospitalist service for chest discomfort.  CTA chest revealed a left lung per members him  -Acute pulmonary embolism Because of increased BMI greater than 40  started patient IV heparin drip and anticoagulated with oral Coumadin INR still subtherapeutic Follow-up INR Pharmacy follow-up appreciated Venous Doppler ultrasound of lower extremities no dvt CTA chest showed no right heart strain  -Chest discomfort could be secondary to pulmonary embolism improved Anticoagulate patient with heparin drip  -COPD appears stable Home dose inhalers  -Sleep apnea CPAP at bedtime  -Atrial fibrillation Continue anticoagulation with heparin and Coumadin Continue oral amiodarone  -Chronic systolic heart failure Continue torsemide and Aldactone  -Urinary retention Start flomax Urology evaluation as patient requested for buried penis   All the records are reviewed and case discussed with Care Management/Social Worker. Management plans discussed with the patient, family and they are in agreement.  CODE STATUS: Full code  DVT Prophylaxis: SCDs  TOTAL TIME TAKING CARE OF THIS PATIENT: 34 minutes.   POSSIBLE D/C IN 2 to 3 DAYS, DEPENDING ON CLINICAL CONDITION.  Ihor [name] M.D on 10/26/2018 at 10:31 AM  Between 7am to 6pm - Pager - [phone]  After 6pm go to  [email or web address] - password [email or web address] [facility]  Office  [phone]  CC: Primary care physician; [name], DO  Note: This dictation was  prepared with Dragon dictation along with smaller phrase technology. Any transcriptional errors that result from this process are unintentional.

## 2018-10-26 NOTE — Plan of Care (Signed)
  Problem: Education: Goal: Knowledge of General Education information will improve Description: Including pain rating scale, medication(s)/side effects and non-pharmacologic comfort measures Outcome: Progressing   Problem: Clinical Measurements: Goal: Will remain free from infection Outcome: Progressing Goal: Respiratory complications will improve Outcome: Progressing Goal: Cardiovascular complication will be avoided Outcome: Progressing   Problem: Health Behavior/Discharge Planning: Goal: Ability to manage health-related needs will improve Outcome: Not Progressing   Problem: Pain Managment: Goal: General experience of comfort will improve Outcome: Not Progressing

## 2018-10-26 NOTE — Consult Note (Signed)
ANTICOAGULATION CONSULT NOTE   Pharmacy Consult for Heparin and warfarin  Indication: pulmonary embolus  No Known Allergies  Patient Measurements: Height: 5\' 8"  (172.7 cm) Weight: (!) 406 lb 8 oz (184.4 kg) IBW/kg (Calculated) : 68.4 Heparin Dosing Weight: 113.6 kg  Vital Signs: Temp: 98.3 F (36.8 C) (09/10 0733) Temp Source: Oral (09/10 0733) BP: 122/62 (09/10 0733) Pulse Rate: 86 (09/10 0733)  Labs: Recent Labs    10/23/18 1817 10/23/18 2039 10/24/18 2122 10/24/18 4825  10/25/18 0006 10/25/18 0037 10/25/18 0950 10/25/18 1752 10/26/18 0308 10/26/18 0505 10/26/18 0911  HGB 11.8*  11.8*  --   --  12.1*  --  11.5* 12.7*  --   --   --  11.7*  --   HCT 36.2*  36.6*  --   --  37.3*  --  34.5* 37.6*  --   --   --  35.3*  --   PLT 258  235  --   --  242  --  200 191  --   --   --  167  --   APTT 27  --   --   --   --  45*  --  61* 66* 86*  --  82*  LABPROT 13.7  --   --   --   --   --   --  14.4  --   --  17.1*  --   INR 1.1  --   --   --   --   --   --  1.1  --   --  1.4*  --   HEPARINUNFRC  --   --   --   --    < > 1.47*  --   --   --  0.74*  --  0.76*  CREATININE 1.12  --   --  1.25*  --  1.22  --   --   --   --   --   --   TROPONINIHS 31* 25* 29* 27*  --   --   --   --   --   --   --   --    < > = values in this interval not displayed.    Estimated Creatinine Clearance: 107.2 mL/min (by C-G formula based on SCr of 1.22 mg/dL).   Medical History: Past Medical History:  Diagnosis Date  . Asthma   . Brain damage   . Chronic pain of both knees 07/13/2018  . COPD (chronic obstructive pulmonary disease) (HCC)   . Depression   . HFrEF (heart failure with reduced ejection fraction) (HCC)    a. 03/2018 Echo: EF 25-30%, diff HK. Mod dil LA.  [name] Hypertension   . Neck pain 07/13/2018  . Persistent atrial fibrillation    a. 03/2018 s/p DCCV; b. CHA2DS2VASc = 1-->Xarelto; c. 05/2018 recurrent afib-->Amio initiated;   . Sleep apnea     Medications:  Medications Prior to  Admission  Medication Sig Dispense Refill Last Dose  . allopurinol (ZYLOPRIM) 100 MG tablet Take 1 tablet (100 mg total) by mouth daily. 30 tablet 3 10/22/2018 at Unknown time  . amiodarone (PACERONE) 200 MG tablet Take 1 tablet (200 mg total) by mouth daily. 30 tablet 0 10/22/2018 at Unknown time  . atorvastatin (LIPITOR) 40 MG tablet TAKE 1 TABLET BY MOUTH ONCE DAILY (Patient taking differently: Take 40 mg by mouth daily. ) 30 tablet 2 10/22/2018 at Unknown time  . DULoxetine (CYMBALTA) 20 MG  capsule Take 20 mg by mouth daily.   10/22/2018 at Unknown time  . ferrous sulfate 325 (65 FE) MG tablet Take 1 tablet (325 mg total) by mouth 2 (two) times daily with a meal. 60 tablet 3 10/22/2018 at Unknown time  . levothyroxine (SYNTHROID) 50 MCG tablet Take 1 tablet (50 mcg total) by mouth daily before breakfast. 30 tablet 1 10/22/2018 at Unknown time  . lisinopril (ZESTRIL) 2.5 MG tablet Take 2.5 mg by mouth daily.   10/22/2018 at Unknown time  . meloxicam (MOBIC) 15 MG tablet Take 1 tablet (15 mg total) by mouth daily. 90 tablet 1 10/22/2018 at Unknown time  . metoprolol succinate (TOPROL-XL) 50 MG 24 hr tablet Take 50 mg by mouth 2 (two) times daily. Take with or immediately following a meal.   10/22/2018 at 1800  . rivaroxaban (XARELTO) 20 MG TABS tablet Take 1 tablet (20 mg total) by mouth daily with supper. 90 tablet 0 10/22/2018 at 1800  . spironolactone (ALDACTONE) 25 MG tablet Take 1 tablet (25 mg total) by mouth daily. 30 tablet 3 10/22/2018 at Unknown time  . torsemide (DEMADEX) 20 MG tablet Take 40 mg by mouth daily.   10/22/2018 at Unknown time  . traZODone (DESYREL) 150 MG tablet Take 1 tablet (150 mg total) by mouth at bedtime as needed for sleep. 30 tablet 2 10/22/2018 at Unknown time  . albuterol (VENTOLIN HFA) 108 (90 Base) MCG/ACT inhaler Inhale 2 puffs into the lungs every 6 (six) hours as needed for wheezing or shortness of breath. (Patient not taking: Reported on 10/24/2018) 1 Inhaler 3 Not Taking at Unknown  time  . nitroGLYCERIN (NITROSTAT) 0.4 MG SL tablet Place 1 tablet (0.4 mg total) under the tongue every 5 (five) minutes as needed for chest pain. (Patient not taking: Reported on 10/17/2018) 15 tablet 12 Not Taking at Unknown time   Scheduled:  . amiodarone  200 mg Oral Daily  . atorvastatin  40 mg Oral Daily  . DULoxetine  20 mg Oral Daily  . levothyroxine  50 mcg Oral QAC breakfast  . pantoprazole  40 mg Oral BID  . sodium chloride flush  10 mL Intravenous Q12H  . sodium chloride flush  3 mL Intravenous Once  . spironolactone  25 mg Oral Daily  . tamsulosin  0.4 mg Oral Daily  . torsemide  40 mg Oral Daily  . Warfarin - Pharmacist Dosing Inpatient   Does not apply q1800   Infusions:  . heparin 2,300 Units/hr (10/26/18 0700)   PRN: acetaminophen **OR** acetaminophen, ondansetron **OR** ondansetron (ZOFRAN) IV, ondansetron (ZOFRAN) IV, oxyCODONE, sodium chloride flush, traZODone Anti-infectives (From admission, onward)   None      Assessment: Pharmacy consulted to start heparin for PE. BMI > 40, ISTH recommended against the use of DOAC in this pt population. Will start heparin and bridge with warfarin. Pt was on rivaroxaban PTA and received a dose of apixaban 9/8. Will use aPTT to monitor heparin and order baseline INR aPTT, and heparin level. Pt continues to have some chest pain.   Heparin:  9/9 0000 aPTT 45 HL 1.47  9/9 0950 aPTT 61 9/9 1752 aPTT 66 increase rate to 2300 units/hr.  9/10 0308 aPTT 86 HL 0.74 9/10 0911 aPTT 82 HL 0.76  Date INR Warfarin Dose  9/8 1.1 (9/7) 10 mg  9/9 1.1 12 mg   9/10 1.4 12 mg - ordered    Goal of Therapy:  INR 2-3 on warfarin  APTT 66-102  HL 0.3-0.7 once aptt and heparin level correlate.  Monitor platelets by anticoagulation protocol: Yes   Plan:  APTT therapeutic, HL still not correlating, will continue current rate and will recheck aPTT/HL with AM labs, Hgb trending down but overall CBC stable, monitor w/ am labs.  Warfarin:  INR  subtherapeutic, but trending up. Jump in INR most likely due to 10 mg dose given on 9/8. Predict INR to continue trending up. Will order warfarin 12 mg x 1 tonight due to BMI 60. Pt is on amiodarone which can increase INR. Daily INR ordered.  Ronnald RampKishan S Arseniy Toomey, PharmD, BCPS 10/26/2018,10:14 AM

## 2018-10-26 NOTE — Plan of Care (Signed)
  Problem: Education: Goal: Knowledge of General Education information will improve Description: Including pain rating scale, medication(s)/side effects and non-pharmacologic comfort measures Outcome: Progressing   Problem: Health Behavior/Discharge Planning: Goal: Ability to manage health-related needs will improve Outcome: Progressing   Problem: Clinical Measurements: Goal: Ability to maintain clinical measurements within normal limits will improve Outcome: Progressing Goal: Will remain free from infection Outcome: Progressing Note: Remains afebrile Goal: Diagnostic test results will improve Outcome: Progressing Note: INR is slowly increasing, 12mg  coumadin given tonight.  Heparin gtt at 63mlhr Goal: Respiratory complications will improve Outcome: Progressing Goal: Cardiovascular complication will be avoided Outcome: Progressing   Problem: Pain Managment: Goal: General experience of comfort will improve Outcome: Progressing   Problem: Education: Goal: Understanding of cardiac disease, CV risk reduction, and recovery process will improve Outcome: Progressing Goal: Individualized Educational Video(s) Outcome: Progressing   Problem: Activity: Goal: Ability to tolerate increased activity will improve Outcome: Progressing

## 2018-10-27 LAB — BASIC METABOLIC PANEL
Anion gap: 10 (ref 5–15)
BUN: 15 mg/dL (ref 6–20)
CO2: 29 mmol/L (ref 22–32)
Calcium: 9 mg/dL (ref 8.9–10.3)
Chloride: 95 mmol/L — ABNORMAL LOW (ref 98–111)
Creatinine, Ser: 1.35 mg/dL — ABNORMAL HIGH (ref 0.61–1.24)
GFR calc Af Amer: >60 mL/min (ref >60)
GFR calc non Af Amer: 57 mL/min — ABNORMAL LOW (ref >60)
Glucose, Bld: 148 mg/dL — ABNORMAL HIGH (ref 70–99)
Potassium: 4.3 mmol/L (ref 3.5–5.1)
Sodium: 134 mmol/L — ABNORMAL LOW (ref 135–145)

## 2018-10-27 LAB — PROTIME-INR
INR: 2 — ABNORMAL HIGH (ref 0.8–1.2)
Prothrombin Time: 22.6 seconds — ABNORMAL HIGH (ref 11.4–15.2)

## 2018-10-27 LAB — CBC
HCT: 37.8 % — ABNORMAL LOW (ref 39.0–52.0)
Hemoglobin: 12.3 g/dL — ABNORMAL LOW (ref 13.0–17.0)
MCH: 29.6 pg (ref 26.0–34.0)
MCHC: 32.5 g/dL (ref 30.0–36.0)
MCV: 90.9 fL (ref 80.0–100.0)
Platelets: 209 10*3/uL (ref 150–400)
RBC: 4.16 MIL/uL — ABNORMAL LOW (ref 4.22–5.81)
RDW: 16.4 % — ABNORMAL HIGH (ref 11.5–15.5)
WBC: 6.5 10*3/uL (ref 4.0–10.5)
nRBC: 0 % (ref 0.0–0.2)

## 2018-10-27 LAB — APTT: aPTT: 109 seconds — ABNORMAL HIGH (ref 24–36)

## 2018-10-27 LAB — HEPARIN LEVEL (UNFRACTIONATED): Heparin Unfractionated: 0.44 IU/mL (ref 0.30–0.70)

## 2018-10-27 MED ORDER — WARFARIN SODIUM 10 MG PO TABS
10.0000 mg | ORAL_TABLET | Freq: Once | ORAL | Status: AC
  Administered 2018-10-27: 10 mg via ORAL
  Filled 2018-10-27: qty 1

## 2018-10-27 NOTE — Progress Notes (Signed)
[place] [facility] [place] NAME: [name] [name]    MR#:  [id number]  DATE OF BIRTH:  01-31-1961  SUBJECTIVE:  CHIEF COMPLAINT:   Chief Complaint  Patient presents with  . Chest Pain  Patient seen and evaluated today No complaints of chest pain Decreased pain in the neck Concerned about buried penis Patient is concerned that his penis is drowned within the scrotum No complaints of any fever No abdominal pain  REVIEW OF SYSTEMS:    ROS  CONSTITUTIONAL: No documented fever. No fatigue, weakness. No weight gain, no weight loss.  EYES: No blurry or double vision.  ENT: No tinnitus. No postnasal drip. No redness of the oropharynx.  RESPIRATORY: No cough, no wheeze, no hemoptysis. No dyspnea.  CARDIOVASCULAR: Has chest pain. No orthopnea. No palpitations. No syncope.  GASTROINTESTINAL: No nausea, no vomiting or diarrhea. No abdominal pain. No melena or hematochezia.  GENITOURINARY: No dysuria or hematuria.  ENDOCRINE: No polyuria or nocturia. No heat or cold intolerance.  HEMATOLOGY: No anemia. No bruising. No bleeding.  INTEGUMENTARY: No rashes. No lesions.  MUSCULOSKELETAL: No arthritis. No swelling. No gout.  NEUROLOGIC: No numbness, tingling, or ataxia. No seizure-type activity.  PSYCHIATRIC: No anxiety. No insomnia. No ADD.   DRUG ALLERGIES:  No Known Allergies  VITALS:  Blood pressure (!) 120/51, pulse 78, temperature 99.3 F (37.4 C), temperature source Oral, resp. rate 19, height 5\' 8"  (1.727 m), weight (!) 183.9 kg, SpO2 98 %.  PHYSICAL EXAMINATION:   Physical Exam  GENERAL:  58 y.o.-year-old patient lying in the bed with no acute distress.  EYES: Pupils equal, round, reactive to light and accommodation. No scleral icterus. Extraocular muscles intact.  HEENT: Head atraumatic, normocephalic. Oropharynx and nasopharynx clear.  NECK:  Supple, no jugular venous distention. No thyroid enlargement, no tenderness.  LUNGS: Normal  breath sounds bilaterally, no wheezing, rales, rhonchi. No use of accessory muscles of respiration.  CARDIOVASCULAR: S1, S2 normal. No murmurs, rubs, or gallops.  ABDOMEN: Soft, nontender, nondistended. Bowel sounds present. No organomegaly or mass. Obese Scrotal area : Buried penis EXTREMITIES: No cyanosis, clubbing  Has edema b/l.    Ace wrap bandages to the lower extremities NEUROLOGIC: Cranial nerves II through XII are intact. No focal Motor or sensory deficits b/l.   PSYCHIATRIC: The patient is alert and oriented x 3.  SKIN: No obvious rash, lesion, or ulcer.   LABORATORY PANEL:   CBC Recent Labs  Lab 10/27/18 0612  WBC 6.5  HGB 12.3*  HCT 37.8*  PLT 209   ------------------------------------------------------------------------------------------------------------------ Chemistries  Recent Labs  Lab 10/23/18 2039  10/27/18 0612  NA  --    < > 134*  K  --    < > 4.3  CL  --    < > 95*  CO2  --    < > 29  GLUCOSE  --    < > 148*  BUN  --    < > 15  CREATININE  --    < > 1.35*  CALCIUM  --    < > 9.0  AST 34  --   --   ALT 16  --   --   ALKPHOS 79  --   --   BILITOT 0.9  --   --    < > = values in this interval not displayed.   ------------------------------------------------------------------------------------------------------------------  Cardiac Enzymes No results for input(s): TROPONINI in the last 168 hours. ------------------------------------------------------------------------------------------------------------------  RADIOLOGY:  No  results found.   ASSESSMENT AND PLAN:  58 year old male patient with history of chronic systolic heart failure, COPD, chronic atrial fibrillation on Xarelto for anticoagulation, hypertension, sleep apnea currently under hospitalist service for chest discomfort.  CTA chest revealed a left lung per members him  -Acute pulmonary embolism Because of increased BMI greater than 40  started patient IV heparin drip and  anticoagulated with oral Coumadin INR 2 Will repeat inr in am target 2.5 Follow-up INR Pharmacy follow-up appreciated Venous Doppler ultrasound of lower extremities no dvt CTA chest showed no right heart strain  -Chest discomfort could be secondary to pulmonary embolism improved Anticoagulate patient with heparin drip and coumadin  -COPD appears stable Home dose inhalers  -Sleep apnea CPAP at bedtime  -Atrial fibrillation Continue anticoagulation with heparin and Coumadin Continue oral amiodarone  -Chronic systolic heart failure Continue torsemide and Aldactone  -Urinary retention Started flomax  [place] urology regarding buried penis, out patient follow up, if needed and fixable and out patient plastic surgery  All the records are reviewed and case discussed with Care Management/Social Worker. Management plans discussed with the patient, family and they are in agreement.  CODE STATUS: Full code  DVT Prophylaxis: SCDs  TOTAL TIME TAKING CARE OF THIS PATIENT: 32 minutes.   POSSIBLE D/C IN 2 to 3 DAYS, DEPENDING ON CLINICAL CONDITION.  Ihor [name] M.D on 10/27/2018 at 11:20 AM  Between 7am to 6pm - Pager - [phone]  After 6pm go to [email or web address] - password [email or web address] [facility]  [facility]  Office  [phone]  CC: Primary care physician; Dorcas Carrow, DO  Note: This dictation was prepared with Dragon dictation along with smaller phrase technology. Any transcriptional errors that result from this process are unintentional.

## 2018-10-27 NOTE — Consult Note (Signed)
ANTICOAGULATION CONSULT NOTE   Pharmacy Consult for Heparin and warfarin  Indication: pulmonary embolus  No Known Allergies  Patient Measurements: Height: 5\' 8"  (172.7 cm) Weight: (!) 405 lb 5.8 oz (183.9 kg) IBW/kg (Calculated) : 68.4 Heparin Dosing Weight: 113.6 kg  Vital Signs: Temp: 99.3 F (37.4 C) (09/11 0803) Temp Source: Oral (09/11 0803) BP: 120/51 (09/11 0803) Pulse Rate: 78 (09/11 0803)  Labs: Recent Labs    10/25/18 0006 10/25/18 4193 10/25/18 0950  10/26/18 0308 10/26/18 0505 10/26/18 0911 10/27/18 0612  HGB 11.5* 12.7*  --   --   --  11.7*  --  12.3*  HCT 34.5* 37.6*  --   --   --  35.3*  --  37.8*  PLT 200 191  --   --   --  167  --  209  APTT 45*  --  61*   < > 86*  --  82* 109*  LABPROT  --   --  14.4  --   --  17.1*  --  22.6*  INR  --   --  1.1  --   --  1.4*  --  2.0*  HEPARINUNFRC 1.47*  --   --   --  0.74*  --  0.76* 0.44  CREATININE 1.22  --   --   --   --   --   --  1.35*   < > = values in this interval not displayed.    Estimated Creatinine Clearance: 96.7 mL/min (A) (by C-G formula based on SCr of 1.35 mg/dL (H)).   Medical History: Past Medical History:  Diagnosis Date  . Asthma   . Brain damage   . Chronic pain of both knees 07/13/2018  . COPD (chronic obstructive pulmonary disease) (HCC)   . Depression   . HFrEF (heart failure with reduced ejection fraction) (HCC)    a. 03/2018 Echo: EF 25-30%, diff HK. Mod dil LA.  [name] Hypertension   . Neck pain 07/13/2018  . Persistent atrial fibrillation    a. 03/2018 s/p DCCV; b. CHA2DS2VASc = 1-->Xarelto; c. 05/2018 recurrent afib-->Amio initiated;   . Sleep apnea     Medications:  Medications Prior to Admission  Medication Sig Dispense Refill Last Dose  . allopurinol (ZYLOPRIM) 100 MG tablet Take 1 tablet (100 mg total) by mouth daily. 30 tablet 3 10/22/2018 at Unknown time  . amiodarone (PACERONE) 200 MG tablet Take 1 tablet (200 mg total) by mouth daily. 30 tablet 0 10/22/2018 at Unknown time   . atorvastatin (LIPITOR) 40 MG tablet TAKE 1 TABLET BY MOUTH ONCE DAILY (Patient taking differently: Take 40 mg by mouth daily. ) 30 tablet 2 10/22/2018 at Unknown time  . DULoxetine (CYMBALTA) 20 MG capsule Take 20 mg by mouth daily.   10/22/2018 at Unknown time  . ferrous sulfate 325 (65 FE) MG tablet Take 1 tablet (325 mg total) by mouth 2 (two) times daily with a meal. 60 tablet 3 10/22/2018 at Unknown time  . levothyroxine (SYNTHROID) 50 MCG tablet Take 1 tablet (50 mcg total) by mouth daily before breakfast. 30 tablet 1 10/22/2018 at Unknown time  . lisinopril (ZESTRIL) 2.5 MG tablet Take 2.5 mg by mouth daily.   10/22/2018 at Unknown time  . meloxicam (MOBIC) 15 MG tablet Take 1 tablet (15 mg total) by mouth daily. 90 tablet 1 10/22/2018 at Unknown time  . metoprolol succinate (TOPROL-XL) 50 MG 24 hr tablet Take 50 mg by mouth 2 (two) times daily.  Take with or immediately following a meal.   10/22/2018 at 1800  . rivaroxaban (XARELTO) 20 MG TABS tablet Take 1 tablet (20 mg total) by mouth daily with supper. 90 tablet 0 10/22/2018 at 1800  . spironolactone (ALDACTONE) 25 MG tablet Take 1 tablet (25 mg total) by mouth daily. 30 tablet 3 10/22/2018 at Unknown time  . torsemide (DEMADEX) 20 MG tablet Take 40 mg by mouth daily.   10/22/2018 at Unknown time  . traZODone (DESYREL) 150 MG tablet Take 1 tablet (150 mg total) by mouth at bedtime as needed for sleep. 30 tablet 2 10/22/2018 at Unknown time  . albuterol (VENTOLIN HFA) 108 (90 Base) MCG/ACT inhaler Inhale 2 puffs into the lungs every 6 (six) hours as needed for wheezing or shortness of breath. (Patient not taking: Reported on 10/24/2018) 1 Inhaler 3 Not Taking at Unknown time  . nitroGLYCERIN (NITROSTAT) 0.4 MG SL tablet Place 1 tablet (0.4 mg total) under the tongue every 5 (five) minutes as needed for chest pain. (Patient not taking: Reported on 10/17/2018) 15 tablet 12 Not Taking at Unknown time   Scheduled:  . amiodarone  200 mg Oral Daily  . atorvastatin  40  mg Oral Daily  . DULoxetine  20 mg Oral Daily  . levothyroxine  50 mcg Oral QAC breakfast  . pantoprazole  40 mg Oral BID  . sodium chloride flush  10 mL Intravenous Q12H  . sodium chloride flush  3 mL Intravenous Once  . spironolactone  25 mg Oral Daily  . tamsulosin  0.4 mg Oral Daily  . torsemide  40 mg Oral Daily  . Warfarin - Pharmacist Dosing Inpatient   Does not apply q1800   Infusions:  . heparin 2,300 Units/hr (10/27/18 0803)   PRN: acetaminophen **OR** acetaminophen, ondansetron **OR** ondansetron (ZOFRAN) IV, ondansetron (ZOFRAN) IV, oxyCODONE, sodium chloride flush, traZODone Anti-infectives (From admission, onward)   None      Assessment: Pharmacy consulted to start heparin for PE. BMI > 40   ISTH recommended against the use of DOAC in this pt population.   Will start heparin and bridge with warfarin. Pt was on rivaroxaban PTA and received a dose of apixaban 9/8.   Will use aPTT to monitor heparin and order baseline INR aPTT, and heparin level. Pt continues to have some chest pain.   Heparin:  9/9 0000 aPTT 45 HL 1.47  9/9 0950 aPTT 61 9/9 1752 aPTT 66 increase rate to 2300 units/hr.  9/10 0308 aPTT 86 HL 0.74 9/10 0911 aPTT 82 HL 0.76  Date INR Warfarin Dose  9/8 1.1 (9/7) 10 mg  9/9 1.1 12 mg   9/10 1.4 12 mg   9/11 2.0 10mg     Goal of Therapy:  INR 2-3 on warfarin  APTT 66-102  HL 0.3-0.7 once aptt and heparin level correlate.  Monitor platelets by anticoagulation protocol: Yes   Plan:  APTT mildly supratherapeutic, HL starting to correlate (reversed of expected), but will continue current rate and will recheck aPTT/HL with AM labs,   Hgb trending up and overall CBC stable, monitor w/ am labs.  Warfarin:  INR therapeutic x 1 - Will discontinue heparin drip if INR therapeutic again with tomorrow AM labs per protocol  Predict INR to begin to stabilize. Will order warfarin 10 mg x 1 tonight   Pt is on amiodarone which can increase INR. Daily INR  ordered.  Lu Duffel, PharmD, BCPS Clinical Pharmacist 10/27/2018 8:24 AM

## 2018-10-28 LAB — HEPARIN LEVEL (UNFRACTIONATED): Heparin Unfractionated: 0.27 IU/mL — ABNORMAL LOW (ref 0.30–0.70)

## 2018-10-28 LAB — CBC
HCT: 35.3 % — ABNORMAL LOW (ref 39.0–52.0)
Hemoglobin: 11.6 g/dL — ABNORMAL LOW (ref 13.0–17.0)
MCH: 29.9 pg (ref 26.0–34.0)
MCHC: 32.9 g/dL (ref 30.0–36.0)
MCV: 91 fL (ref 80.0–100.0)
Platelets: 166 10*3/uL (ref 150–400)
RBC: 3.88 MIL/uL — ABNORMAL LOW (ref 4.22–5.81)
RDW: 16.4 % — ABNORMAL HIGH (ref 11.5–15.5)
WBC: 5.3 10*3/uL (ref 4.0–10.5)
nRBC: 0 % (ref 0.0–0.2)

## 2018-10-28 LAB — PROTIME-INR
INR: 2.6 — ABNORMAL HIGH (ref 0.8–1.2)
Prothrombin Time: 27.6 seconds — ABNORMAL HIGH (ref 11.4–15.2)

## 2018-10-28 LAB — CREATININE, SERUM
Creatinine, Ser: 1.14 mg/dL (ref 0.61–1.24)
GFR calc Af Amer: >60 mL/min (ref >60)
GFR calc non Af Amer: >60 mL/min (ref >60)

## 2018-10-28 LAB — APTT: aPTT: 112 seconds — ABNORMAL HIGH (ref 24–36)

## 2018-10-28 MED ORDER — WARFARIN SODIUM 5 MG PO TABS: 5.0000 mg | ORAL_TABLET | Freq: Every day | ORAL | 1 refills | Status: DC

## 2018-10-28 MED ORDER — OXYCODONE HCL 10 MG PO TABS: 10.0000 mg | ORAL_TABLET | ORAL | 0 refills | Status: DC | PRN

## 2018-10-28 NOTE — TOC Transition Note (Signed)
Transition of Care [facility] - CM/SW Discharge Note   Patient Details  Name: [name] [name] MRN: [id number] Date of Birth: 1960-07-15  Transition of Care [facility]) CM/SW Contact:  Ross Ludwig, LCSW Phone Number: 10/28/2018, 1:03 PM   Clinical Narrative:     Patient will be discharging home with home health.  Patient does not have a ride home and is not registered with Medicaid transportation.  CSW provided patient with phone number to contact Medicaid transport, and also provided a cab voucher for him to go home.  CSW signing off please reconsult if social work needs arise.   Final next level of care: [place] Barriers to Discharge: Barriers Resolved   Patient Goals and CMS Choice Patient states their goals for this hospitalization and ongoing recovery are:: To return home with home heatlh nursing CMS Medicare.gov Compare Post Acute Care list provided to:: Patient Choice offered to / list presented to : Patient  Discharge Placement  Patient discharging home with home health through [facility].                     Discharge Plan and Services   Discharge Planning Services: CM Consult, Other - See comment(Transportation needs) Post Acute Care Choice: Home Health          DME Arranged: N/A DME Agency: NA       HH Arranged: RN [place] Agency: [facility] [employer] [facility] [employer] Date [place]: 10/28/18 Time [place]: 81 Representative spoke with at [place]: [place] ([place]) Interventions     Readmission Risk Interventions Readmission Risk Prevention Plan 10/25/2018 08/08/2018 07/23/2018  Transportation Screening Complete Complete Complete  PCP or Specialist Appt within 3-5 Days Complete Complete -  HRI or Home Care Consult Complete Complete -  Social Work Consult for [place] Planning/Counseling Complete Patient refused -  Palliative Care Screening Not Applicable Not Applicable -   Medication Review Press photographer) Complete Complete -  PCP or Specialist appointment within 3-5 days of discharge - - -  [place] or [place] - - Complete  [place] - - Not Applicable

## 2018-10-28 NOTE — Plan of Care (Signed)
  Problem: Education: Goal: Knowledge of General Education information will improve Description: Including pain rating scale, medication(s)/side effects and non-pharmacologic comfort measures 10/28/2018 1313 by Chriss Czar, Illene Bolus, RN Outcome: Adequate for Discharge 10/28/2018 1313 by Chriss Czar, Illene Bolus, RN Outcome: Adequate for Discharge   Problem: Health Behavior/Discharge Planning: Goal: Ability to manage health-related needs will improve 10/28/2018 1313 by Chriss Czar, Illene Bolus, RN Outcome: Adequate for Discharge 10/28/2018 1313 by Chriss Czar, Illene Bolus, RN Outcome: Adequate for Discharge   Problem: Clinical Measurements: Goal: Ability to maintain clinical measurements within normal limits will improve 10/28/2018 1313 by Feliberto Gottron, RN Outcome: Adequate for Discharge 10/28/2018 1313 by Chriss Czar, Illene Bolus, RN Outcome: Adequate for Discharge Goal: Will remain free from infection 10/28/2018 1313 by Feliberto Gottron, RN Outcome: Adequate for Discharge 10/28/2018 1313 by Chriss Czar, Illene Bolus, RN Outcome: Adequate for Discharge Goal: Diagnostic test results will improve 10/28/2018 1313 by Feliberto Gottron, RN Outcome: Adequate for Discharge 10/28/2018 1313 by Chriss Czar, Illene Bolus, RN Outcome: Adequate for Discharge Goal: Respiratory complications will improve 10/28/2018 1313 by Feliberto Gottron, RN Outcome: Adequate for Discharge 10/28/2018 1313 by Chriss Czar, Illene Bolus, RN Outcome: Adequate for Discharge Goal: Cardiovascular complication will be avoided 10/28/2018 1313 by Chriss Czar, Illene Bolus, RN Outcome: Adequate for Discharge 10/28/2018 1313 by Chriss Czar, Illene Bolus, RN Outcome: Adequate for Discharge   Problem: Pain Managment: Goal: General experience of comfort will improve 10/28/2018 1313 by Chriss Czar, Illene Bolus, RN Outcome: Adequate for Discharge 10/28/2018 1313 by Chriss Czar, Illene Bolus,  RN Outcome: Adequate for Discharge   Problem: Safety: Goal: Ability to remain free from injury will improve 10/28/2018 1313 by Chriss Czar, Illene Bolus, RN Outcome: Adequate for Discharge 10/28/2018 1313 by Chriss Czar, Illene Bolus, RN Outcome: Adequate for Discharge   Problem: Education: Goal: Understanding of cardiac disease, CV risk reduction, and recovery process will improve 10/28/2018 1313 by Feliberto Gottron, RN Outcome: Adequate for Discharge 10/28/2018 1313 by Feliberto Gottron, RN Outcome: Adequate for Discharge Goal: Individualized Educational Video(s) 10/28/2018 1313 by Feliberto Gottron, RN Outcome: Adequate for Discharge 10/28/2018 1313 by Feliberto Gottron, RN Outcome: Adequate for Discharge   Problem: Activity: Goal: Ability to tolerate increased activity will improve 10/28/2018 1313 by Chriss Czar, Illene Bolus, RN Outcome: Adequate for Discharge 10/28/2018 1313 by Chriss Czar, Illene Bolus, RN Outcome: Adequate for Discharge   Problem: Cardiac: Goal: Ability to achieve and maintain adequate cardiovascular perfusion will improve 10/28/2018 1313 by Feliberto Gottron, RN Outcome: Adequate for Discharge 10/28/2018 1313 by Chriss Czar, Illene Bolus, RN Outcome: Adequate for Discharge   Problem: Health Behavior/Discharge Planning: Goal: Ability to safely manage health-related needs after discharge will improve 10/28/2018 1313 by Feliberto Gottron, RN Outcome: Adequate for Discharge 10/28/2018 1313 by Feliberto Gottron, RN Outcome: Adequate for Discharge

## 2018-10-28 NOTE — Progress Notes (Signed)
Dr. Vianne Bulls at bedside, verbal orders with read back to discontinue heparin gtt. Patient will be discharged today. Patient refusing bed alarm and chair alarm. Educated and still refused. Will continue to monitor patient.

## 2018-10-28 NOTE — Consult Note (Signed)
ANTICOAGULATION CONSULT NOTE   Pharmacy Consult for Heparin and warfarin  Indication: pulmonary embolus  No Known Allergies  Patient Measurements: Height: 5\' 8"  (172.7 cm) Weight: (!) 408 lb 6.4 oz (185.2 kg) IBW/kg (Calculated) : 68.4 Heparin Dosing Weight: 113.6 kg  Vital Signs: Temp: 98.6 F (37 C) (09/12 0337) Temp Source: Oral (09/12 0337) BP: 112/63 (09/12 0337) Pulse Rate: 80 (09/12 0337)  Labs: Recent Labs    10/26/18 0505 10/26/18 0911 10/27/18 0612 10/28/18 0512  HGB 11.7*  --  12.3* 11.6*  HCT 35.3*  --  37.8* 35.3*  PLT 167  --  209 166  APTT  --  82* 109* 112*  LABPROT 17.1*  --  22.6* 27.6*  INR 1.4*  --  2.0* 2.6*  HEPARINUNFRC  --  0.76* 0.44 0.27*  CREATININE  --   --  1.35* 1.14    Estimated Creatinine Clearance: 115 mL/min (by C-G formula based on SCr of 1.14 mg/dL).   Medical History: Past Medical History:  Diagnosis Date  . Asthma   . Brain damage   . Chronic pain of both knees 07/13/2018  . COPD (chronic obstructive pulmonary disease) (HCC)   . Depression   . HFrEF (heart failure with reduced ejection fraction) (HCC)    a. 03/2018 Echo: EF 25-30%, diff HK. Mod dil LA.  [name] Hypertension   . Neck pain 07/13/2018  . Persistent atrial fibrillation    a. 03/2018 s/p DCCV; b. CHA2DS2VASc = 1-->Xarelto; c. 05/2018 recurrent afib-->Amio initiated;   . Sleep apnea     Medications:  Medications Prior to Admission  Medication Sig Dispense Refill Last Dose  . allopurinol (ZYLOPRIM) 100 MG tablet Take 1 tablet (100 mg total) by mouth daily. 30 tablet 3 10/22/2018 at Unknown time  . amiodarone (PACERONE) 200 MG tablet Take 1 tablet (200 mg total) by mouth daily. 30 tablet 0 10/22/2018 at Unknown time  . atorvastatin (LIPITOR) 40 MG tablet TAKE 1 TABLET BY MOUTH ONCE DAILY (Patient taking differently: Take 40 mg by mouth daily. ) 30 tablet 2 10/22/2018 at Unknown time  . DULoxetine (CYMBALTA) 20 MG capsule Take 20 mg by mouth daily.   10/22/2018 at Unknown time   . ferrous sulfate 325 (65 FE) MG tablet Take 1 tablet (325 mg total) by mouth 2 (two) times daily with a meal. 60 tablet 3 10/22/2018 at Unknown time  . levothyroxine (SYNTHROID) 50 MCG tablet Take 1 tablet (50 mcg total) by mouth daily before breakfast. 30 tablet 1 10/22/2018 at Unknown time  . lisinopril (ZESTRIL) 2.5 MG tablet Take 2.5 mg by mouth daily.   10/22/2018 at Unknown time  . meloxicam (MOBIC) 15 MG tablet Take 1 tablet (15 mg total) by mouth daily. 90 tablet 1 10/22/2018 at Unknown time  . metoprolol succinate (TOPROL-XL) 50 MG 24 hr tablet Take 50 mg by mouth 2 (two) times daily. Take with or immediately following a meal.   10/22/2018 at 1800  . rivaroxaban (XARELTO) 20 MG TABS tablet Take 1 tablet (20 mg total) by mouth daily with supper. 90 tablet 0 10/22/2018 at 1800  . spironolactone (ALDACTONE) 25 MG tablet Take 1 tablet (25 mg total) by mouth daily. 30 tablet 3 10/22/2018 at Unknown time  . torsemide (DEMADEX) 20 MG tablet Take 40 mg by mouth daily.   10/22/2018 at Unknown time  . traZODone (DESYREL) 150 MG tablet Take 1 tablet (150 mg total) by mouth at bedtime as needed for sleep. 30 tablet 2 10/22/2018 at  Unknown time  . albuterol (VENTOLIN HFA) 108 (90 Base) MCG/ACT inhaler Inhale 2 puffs into the lungs every 6 (six) hours as needed for wheezing or shortness of breath. (Patient not taking: Reported on 10/24/2018) 1 Inhaler 3 Not Taking at Unknown time  . nitroGLYCERIN (NITROSTAT) 0.4 MG SL tablet Place 1 tablet (0.4 mg total) under the tongue every 5 (five) minutes as needed for chest pain. (Patient not taking: Reported on 10/17/2018) 15 tablet 12 Not Taking at Unknown time   Scheduled:  . amiodarone  200 mg Oral Daily  . atorvastatin  40 mg Oral Daily  . DULoxetine  20 mg Oral Daily  . levothyroxine  50 mcg Oral QAC breakfast  . pantoprazole  40 mg Oral BID  . sodium chloride flush  10 mL Intravenous Q12H  . sodium chloride flush  3 mL Intravenous Once  . spironolactone  25 mg Oral Daily   . tamsulosin  0.4 mg Oral Daily  . torsemide  40 mg Oral Daily  . Warfarin - Pharmacist Dosing Inpatient   Does not apply q1800   Infusions:  . heparin 2,300 Units/hr (10/27/18 2051)   PRN: acetaminophen **OR** acetaminophen, ondansetron **OR** ondansetron (ZOFRAN) IV, ondansetron (ZOFRAN) IV, oxyCODONE, sodium chloride flush, traZODone Anti-infectives (From admission, onward)   None      Assessment: Pharmacy consulted to start heparin for PE. BMI > 40   ISTH recommended against the use of DOAC in this pt population.   Will start heparin and bridge with warfarin. Pt was on rivaroxaban PTA and received a dose of apixaban 9/8.   Will use aPTT to monitor heparin and order baseline INR aPTT, and heparin level. Pt continues to have some chest pain.   Heparin:  9/9 0000 aPTT 45 HL 1.47  9/9 0950 aPTT 61 9/9 1752 aPTT 66 increase rate to 2300 units/hr.  9/10 0308 aPTT 86 HL 0.74 9/10 0911 aPTT 82 HL 0.76  Date INR Warfarin Dose  9/8 1.1 (9/7) 10 mg  9/9 1.1 12 mg   9/10 1.4 12 mg   9/11 2.0 10mg     Goal of Therapy:  INR 2-3 on warfarin  APTT 66-102  HL 0.3-0.7 once aptt and heparin level correlate.  Monitor platelets by anticoagulation protocol: Yes   Plan:  09/12 @ 0500 aPTT 112 seconds, HL 0.27 subtherapeutic. APTT supratherapeutic, will reduce rate of heparin drip to 2150 units/hr and will recheck aPTT @ 1300, CBC stable will continue to monitor.  Tobie Lords, PharmD, BCPS Clinical Pharmacist 10/28/2018 7:28 AM

## 2018-10-28 NOTE — Progress Notes (Signed)
Patient's INR is 2.6, will discontinue heparin drip, discharged home with Coumadin.  Patient can take Coumadin 7.5 mg p.o. daily have the INR checked on Monday with home health nurse and fax results to PCP for readjusting the Coumadin dose.  Patient understands this.  Patient already has home health nurse coming in for his leg dressing changes due to his chronic venous stasis.  Discharge instructions are in the computer.

## 2018-10-28 NOTE — Progress Notes (Signed)
Patient given discharge instructions with printed prescription. Midline taken out. Tele monitor off. Patient verbalized understanding without any questions or concerns. Taxi voucher given to patient, taxi cab called. They informed they would be here in an hr. Patient ready for discharge.

## 2018-10-30 ENCOUNTER — Telehealth: Payer: Self-pay

## 2018-10-30 ENCOUNTER — Other Ambulatory Visit: Payer: Self-pay | Admitting: Family Medicine

## 2018-10-30 DIAGNOSIS — I872 Venous insufficiency (chronic) (peripheral): Secondary | ICD-10-CM | POA: Diagnosis not present

## 2018-10-30 NOTE — Telephone Encounter (Signed)
Incoming call from [place] at [place]. Patients  Results today were  PT: 22.4 INR: 1.9 , patient did not take any coumadin yesterday or today, he is going to pick it up now, he was discharged on Saturday. Looks like the doctor at the hospital gave him 5 mg coumadin.   Merry Proud would like to know what you want to do, and if you want him to continue to check his ptinr at home as he will be doing twice weekly visits for a while due to some sores they are treating.

## 2018-10-30 NOTE — Telephone Encounter (Signed)
I have made the 1st attempt to contact the patient or family member in charge, in order to follow up from recently being discharged from the hospital. I left a message on voicemail but I will make another attempt at a different time.  

## 2018-10-30 NOTE — Telephone Encounter (Signed)
Requested medication (s) are due for refill today: yes  Requested medication (s) are on the active medication list: yes  Last refill: 09/05/2018  Future visit scheduled: no  Notes to clinic: review for refill    Requested Prescriptions  Pending Prescriptions Disp Refills   traZODone (DESYREL) 150 MG tablet [Pharmacy Med Name: TRAZODONE HCL 150 MG TAB] 30 tablet 2    Sig: TAKE 1 TABLET BY MOUTH AT BEDTIME AS NEEDED SLEEP     Psychiatry: Antidepressants - Serotonin Modulator Passed - 10/30/2018 10:59 AM      Passed - Valid encounter within last 6 months    Recent Outpatient Visits          1 week ago Depression, major, single episode, moderate ([place])   [place], Megan P, DO   1 month ago Abnormal thyroid blood test   [facility], Megan P, DO   2 months ago Anemia, unspecified type   [facility], Megan P, DO   3 months ago Chronic systolic heart failure [facility])   [place], Megan P, DO   3 months ago Chronic gout without tophus, unspecified cause, unspecified site   [place], Barb Merino, DO      Future Appointments            In 2 days [place], Harrell Gave, MD [facility]   In 2 weeks [place], MD [place]   In 4 weeks [place], Megan P, DO [place], [facility]           Passed - Completed PHQ-2 or PHQ-9 in the last 360 days.

## 2018-10-31 ENCOUNTER — Telehealth: Payer: Self-pay

## 2018-10-31 DIAGNOSIS — G4733 Obstructive sleep apnea (adult) (pediatric): Secondary | ICD-10-CM | POA: Diagnosis not present

## 2018-10-31 NOTE — Discharge Summary (Signed)
[name] [name], is a 58 y.o. male  DOB 27-Jul-1960  MRN [id number].  Admission date:  10/23/2018  Admitting Physician  Oralia Manisavid Willis, MD  Discharge Date:  10/28/2018   Primary MD  Dorcas CarrowJohnson, Megan P, DO  Recommendations for primary care physician for things to follow:  Follow with PCP  In one week   Admission Diagnosis  Lower abdominal pain [R10.30] Chest pain, unspecified type [R07.9]   Discharge Diagnosis  Lower abdominal pain [R10.30] Chest pain, unspecified type [R07.9]    Principal Problem:   Chest pain Active Problems:   Chronic systolic heart failure (HCC)   COPD (chronic obstructive pulmonary disease) (HCC)   Obstructive sleep apnea   Persistent atrial fibrillation   HTN (hypertension)      Past Medical History:  Diagnosis Date  . Asthma   . Brain damage   . Chronic pain of both knees 07/13/2018  . COPD (chronic obstructive pulmonary disease) (HCC)   . Depression   . HFrEF (heart failure with reduced ejection fraction) (HCC)    a. 03/2018 Echo: EF 25-30%, diff HK. Mod dil LA.  [name]. Hypertension   . Neck pain 07/13/2018  . Persistent atrial fibrillation    a. 03/2018 s/p DCCV; b. CHA2DS2VASc = 1-->Xarelto; c. 05/2018 recurrent afib-->Amio initiated;   . Sleep apnea     Past Surgical History:  Procedure Laterality Date  . CARDIOVERSION N/A 03/24/2018   Procedure: CARDIOVERSION;  Surgeon: Antonieta IbaGollan, Timothy J, MD;  Location: [facility] ORS;  Service: Cardiovascular;  Laterality: N/A;  . CARDIOVERSION N/A 08/08/2018   Procedure: CARDIOVERSION;  Surgeon: Yvonne KendallEnd, Christopher, MD;  Location: [facility] ORS;  Service: Cardiovascular;  Laterality: N/A;  . hearnia repair     X 3- total of two surgeries  . LEG SURGERY         History of present illness and  Hospital Course:     Kindly see H&P for history of present illness and  admission details, please review complete Labs, Consult reports and Test reports for all details in brief  HPI  from the history and physical done on the day of admission 58-year-old male patient with history of chronic systolic heart failure, COPD, chronic atrial fibrillation on Xarelto for anticoagulation, hypertension, sleep apnea currently under hospitalist service for chest discomfort.  CTA chest revealed a left lung PE   Hospital Course  58 year old male patient with history of chronic systolic heart failure, COPD, chronic atrial fibrillation on Xarelto for anticoagulation, hypertension, sleep apnea currently under hospitalist service for chest discomfort.  CTA chest revealed a left lung PE. -Acute pulmonary embolism Because of increased BMI greater than 40  started patient IV heparin drip and anticoagulated with oral Coumadin,After INR is therauptic,we stopped Heparin drip and  Sent home with coumadin., I Venous Doppler ultrasound of lower extremities no dvt CTA chest showed no right heart strain  -Chest discomfort could be secondary to pulmonary embolism improved Anticoagulate patient with heparin drip  -COPD appears stable Home dose inhalers  -Sleep apnea CPAP at bedtime  -Atrial fibrillation'intially was on heparin but after INR is more than 2.discharged home with coumadin.Home health to follow INR  At home and monitor coumadin  Dose.advised to stop xarelto, Continue oral amiodarone  -Chronic systolic heart failure Continue torsemide and Aldactone  -Urinary retention Start flomax Urology evaluation as patient requested for buried penis       Note: This dictation was prepared with Dragon dictation along with smaller phrase technology. Any transcriptional errors that result from this  process are unintentional.          Discharge Condition: stable  Follow UP  Follow-up Information    [name], DO. Schedule an appointment as soon as possible  for a visit in 3 day(s).   Specialty: Family Medicine Contact information: [address] [place] [address] [phone]        [name], MD.   Specialty: Cardiology Why: Patient will need to make a follow up appointment. Contact information: [address] [place] [address] [phone]             Discharge Instructions  and  Discharge Medications    Discharge Instructions    Discharge instructions   Complete by: As directed      Allergies as of 10/28/2018   No Known Allergies     Medication List    STOP taking these medications   rivaroxaban 20 MG Tabs tablet Commonly known as: XARELTO     TAKE these medications   albuterol 108 (90 Base) MCG/ACT inhaler Commonly known as: VENTOLIN HFA Inhale 2 puffs into the lungs every 6 (six) hours as needed for wheezing or shortness of breath.   allopurinol 100 MG tablet Commonly known as: ZYLOPRIM Take 1 tablet (100 mg total) by mouth daily.   amiodarone 200 MG tablet Commonly known as: PACERONE Take 1 tablet (200 mg total) by mouth daily.   atorvastatin 40 MG tablet Commonly known as: LIPITOR TAKE 1 TABLET BY MOUTH ONCE DAILY   DULoxetine 20 MG capsule Commonly known as: CYMBALTA Take 20 mg by mouth daily.   ferrous sulfate 325 (65 FE) MG tablet Take 1 tablet (325 mg total) by mouth 2 (two) times daily with a meal.   levothyroxine 50 MCG tablet Commonly known as: SYNTHROID Take 1 tablet (50 mcg total) by mouth daily before breakfast.   lisinopril 2.5 MG tablet Commonly known as: ZESTRIL Take 2.5 mg by mouth daily.   meloxicam 15 MG tablet Commonly known as: MOBIC Take 1 tablet (15 mg total) by mouth daily.   metoprolol succinate 50 MG 24 hr tablet Commonly known as: TOPROL-XL Take 50 mg by mouth 2 (two) times daily. Take with or immediately following a meal.   nitroGLYCERIN 0.4 MG SL tablet Commonly known as: NITROSTAT Place 1 tablet (0.4 mg total) under the tongue every  5 (five) minutes as needed for chest pain.   Oxycodone HCl 10 MG Tabs Take 1 tablet (10 mg total) by mouth every 4 (four) hours as needed for moderate pain.   spironolactone 25 MG tablet Commonly known as: ALDACTONE Take 1 tablet (25 mg total) by mouth daily.   torsemide 20 MG tablet Commonly known as: DEMADEX Take 40 mg by mouth daily.   warfarin 5 MG tablet Commonly known as: Coumadin Take 1 tablet (5 mg total) by mouth daily.         Diet and Activity recommendation: See Discharge Instructions above   Consults obtained -Cardiology   Major procedures and Radiology Reports - PLEASE review detailed and final reports for all details, in brief -     Dg Chest 2 View  Result Date: 10/23/2018 CLINICAL DATA:  LEFT side chest pain beginning at noon today, nausea, history COPD, atrial fibrillation, heart failure EXAM: CHEST - 2 VIEW COMPARISON:  08/06/2018 FINDINGS: Borderline enlargement of cardiac silhouette. Mediastinal contours and pulmonary vascularity normal. Lungs clear. No pleural effusion or pneumothorax. Bones unremarkable. IMPRESSION: No acute abnormalities. Electronically Signed  By: Ulyses SouthwardMark  Boles M.D.   On: 10/23/2018 18:52   Ct Angio Chest Pe W Or Wo Contrast  Result Date: 10/24/2018 CLINICAL DATA:  Chest pain EXAM: CT ANGIOGRAPHY CHEST WITH CONTRAST TECHNIQUE: Multidetector CT imaging of the chest was performed using the standard protocol during bolus administration of intravenous contrast. Multiplanar CT image reconstructions and MIPs were obtained to evaluate the vascular anatomy. CONTRAST:  100mL OMNIPAQUE IOHEXOL 350 MG/ML SOLN COMPARISON:  Chest radiograph October 23, 2018 FINDINGS: Cardiovascular: There are several subsegmental branch pulmonary emboli in the left lower lobe. No more central pulmonary emboli identified. No right heart strain apparent. There is no appreciable thoracic aortic aneurysm or dissection. The visualized great vessels appear unremarkable except  for scattered occasional atherosclerotic calcifications. Note that the right innominate and left common carotid arteries arise as a common trunk, an anatomic variant. There is aortic atherosclerosis as well as foci of coronary artery calcification. There is no pericardial effusion or pericardial thickening. Mediastinum/Nodes: Thyroid appears unremarkable. No adenopathy is evident. No esophageal lesions are appreciable. Lungs/Pleura: There is mild bibasilar atelectasis. There is no edema or consolidation. No pleural effusion evident. Upper Abdomen: Visualized upper abdominal structures appear unremarkable. Musculoskeletal: There is extensive arthropathy in the lower thoracic region with sclerosis in multiple lower thoracic vertebral bodies. No similar changes noted elsewhere. No lytic or destructive bone lesions. Review of the MIP images confirms the above findings. IMPRESSION: 1. There are several small peripheral pulmonary emboli in left lower lobe pulmonary arterial vessels. No more central pulmonary embolus evident. No right heart strain. 2. There is aortic atherosclerosis. No thoracic aortic aneurysm or dissection. There are foci of coronary artery calcification. 3.  Mild bibasilar atelectasis.  No edema or consolidation. 4.  No demonstrable adenopathy. 5. Extensive sclerosis involving several lower thoracic and upper lumbar vertebral bodies. These changes may be due to advanced arthropathy. Given the degree of sclerosis in these areas, correlation with clinical prostate examination and PSA advised. Critical Value/emergent results were called by telephone at the time of interpretation on 10/24/2018 at 8:48 am to Dr. Tobi BastosPyreddy , who verbally acknowledged these results. Aortic Atherosclerosis (ICD10-I70.0). Electronically Signed   By: Bretta BangWilliam  Woodruff III M.D.   On: 10/24/2018 08:48   Ct Abdomen Pelvis W Contrast  Result Date: 10/23/2018 CLINICAL DATA:  Initial evaluation for acute generalized abdominal pain.  EXAM: CT ABDOMEN AND PELVIS WITH CONTRAST TECHNIQUE: Multidetector CT imaging of the abdomen and pelvis was performed using the standard protocol following bolus administration of intravenous contrast. CONTRAST:  125mL OMNIPAQUE IOHEXOL 300 MG/ML  SOLN COMPARISON:  None available. FINDINGS: Lower chest: Visualized lung bases are clear. Hepatobiliary: Liver demonstrates a normal contrast enhanced appearance. Gallbladder within normal limits. No biliary dilatation. Pancreas: Mild diffuse fatty infiltration the pancreas noted. Pancreas otherwise unremarkable without acute peripancreatic inflammation. No abnormal pancreatic ductal dilatation. Spleen: Spleen within normal limits. Adrenals/Urinary Tract: Adrenal glands are normal. Kidneys somewhat atrophic in appearance but equal in size with symmetric enhancement bilaterally. Subcentimeter hypodensity within the interpolar right kidney too small the characterize, but statistically likely reflects a small cyst. No nephrolithiasis or hydronephrosis. No focal enhancing renal mass. No hydroureter. Bladder largely decompressed without acute finding. Stomach/Bowel: Stomach within normal limits. No evidence for bowel obstruction. Appendix within normal limits. Scattered mild colonic diverticulosis without evidence for acute diverticulitis. No acute inflammatory changes seen about the bowels. Vascular/Lymphatic: Normal intravascular enhancement seen throughout the intra-abdominal aorta. Mild aorto bi-iliac atherosclerotic disease. Mesenteric vessels patent proximally. No adenopathy. Reproductive: Negative prostate.  Other: No free air or fluid. Musculoskeletal: No acute osseous abnormality. No discrete lytic or blastic osseous lesions. Moderate multilevel degenerative spondylolysis noted within the visualized thoracolumbar spine. Moderate osteoarthritic changes noted about the right hip. IMPRESSION: 1. No CT evidence for acute intra-abdominal or pelvic process. 2. Mild colonic  diverticulosis without evidence for acute diverticulitis. 3. Mild aorto bi-iliac atherosclerotic disease.  No aneurysm. Electronically Signed   By: Rise MuBenjamin  McClintock M.D.   On: 10/23/2018 23:29   [place] Venous Img Lower Bilateral  Result Date: 10/24/2018 CLINICAL DATA:  History of DVT and pulmonary embolism. Elevated D-dimer. Evaluate for acute or chronic DVT. EXAM: BILATERAL LOWER EXTREMITY VENOUS DOPPLER ULTRASOUND TECHNIQUE: Gray-scale sonography with graded compression, as well as color Doppler and duplex ultrasound were performed to evaluate the lower extremity deep venous systems from the level of the common femoral vein and including the common femoral, femoral, profunda femoral, popliteal and calf veins including the posterior tibial, peroneal and gastrocnemius veins when visible. The superficial great saphenous vein was also interrogated. Spectral Doppler was utilized to evaluate flow at rest and with distal augmentation maneuvers in the common femoral, femoral and popliteal veins. COMPARISON:  None. FINDINGS: Examination is degraded due to patient body habitus and poor sonographic window. RIGHT LOWER EXTREMITY Common Femoral Vein: No evidence of thrombus. Normal compressibility, respiratory phasicity and response to augmentation. Saphenofemoral Junction: No evidence of thrombus. Normal compressibility and flow on color Doppler imaging. Profunda Femoral Vein: No evidence of thrombus. Normal compressibility and flow on color Doppler imaging. Femoral Vein: No evidence of thrombus. Normal compressibility, respiratory phasicity and response to augmentation. Popliteal Vein: No evidence of thrombus. Normal compressibility, respiratory phasicity and response to augmentation. Calf Veins: No evidence of thrombus. Normal compressibility and flow on color Doppler imaging. Superficial Great Saphenous Vein: No evidence of thrombus. Normal compressibility. Venous Reflux:  None. Other Findings:  None. LEFT LOWER  EXTREMITY Common Femoral Vein: No evidence of thrombus. Normal compressibility, respiratory phasicity and response to augmentation. Saphenofemoral Junction: No evidence of thrombus. Normal compressibility and flow on color Doppler imaging. Profunda Femoral Vein: No evidence of thrombus. Normal compressibility and flow on color Doppler imaging. Femoral Vein: No evidence of thrombus. Normal compressibility, respiratory phasicity and response to augmentation. Popliteal Vein: No evidence of thrombus. Normal compressibility, respiratory phasicity and response to augmentation. Calf Veins: No evidence of thrombus. Normal compressibility and flow on color Doppler imaging. Superficial Great Saphenous Vein: No evidence of thrombus. Normal compressibility. Venous Reflux:  None. Other Findings:  None. IMPRESSION: No evidence acute or chronic deep venous thrombosis within either lower extremity. Electronically Signed   By: Simonne ComeJohn  Watts M.D.   On: 10/24/2018 15:07    Micro Results    Recent Results (from the past 240 hour(s))  SARS Coronavirus 2 [facility](Hospital order, Performed [facility] lab) Nasopharyngeal Nasopharyngeal Swab     Status: None   Collection Time: 10/23/18 11:48 PM   Specimen: Nasopharyngeal Swab  Result Value Ref Range Status   SARS Coronavirus 2 NEGATIVE NEGATIVE Final    Comment: (NOTE) If result is NEGATIVE SARS-CoV-2 target nucleic acids are NOT DETECTED. The SARS-CoV-2 RNA is generally detectable in upper and lower  respiratory specimens during the acute phase of infection. The lowest  concentration of SARS-CoV-2 viral copies this assay can detect is 250  copies / mL. A negative result does not preclude SARS-CoV-2 infection  and should not be used as the sole basis for treatment or other  patient management decisions.  A  negative result may occur with  improper specimen collection / handling, submission of specimen other  than nasopharyngeal swab, presence of viral mutation(s)  within the  areas targeted by this assay, and inadequate number of viral copies  (<250 copies / mL). A negative result must be combined with clinical  observations, patient history, and epidemiological information. If result is POSITIVE SARS-CoV-2 target nucleic acids are DETECTED. The SARS-CoV-2 RNA is generally detectable in upper and lower  respiratory specimens dur ing the acute phase of infection.  Positive  results are indicative of active infection with SARS-CoV-2.  Clinical  correlation with patient history and other diagnostic information is  necessary to determine patient infection status.  Positive results do  not rule out bacterial infection or co-infection with other viruses. If result is PRESUMPTIVE POSTIVE SARS-CoV-2 nucleic acids MAY BE PRESENT.   A presumptive positive result was obtained on the submitted specimen  and confirmed on repeat testing.  While 2019 novel coronavirus  (SARS-CoV-2) nucleic acids may be present in the submitted sample  additional confirmatory testing may be necessary for epidemiological  and / or clinical management purposes  to differentiate between  SARS-CoV-2 and other Sarbecovirus currently known to infect humans.  If clinically indicated additional testing with an alternate test  methodology [phone]) is advised. The SARS-CoV-2 RNA is generally  detectable in upper and lower respiratory sp ecimens during the acute  phase of infection. The expected result is Negative. Fact Sheet for Patients:  StrictlyIdeas.no Fact Sheet for Healthcare Providers: BankingDealers.co.za This test is not yet approved or cleared by the [place] FDA and has been authorized for detection and/or diagnosis of SARS-CoV-2 by FDA under an Emergency Use Authorization (EUA).  This EUA will remain in effect (meaning this test can be used) for the duration of the COVID-19 declaration under Section 564(b)(1) of the Act,  21 U.S.C. section 360bbb-3(b)(1), unless the authorization is terminated or revoked sooner. Performed at [facility], [address] [place], [place] [address]        Today   Subjective:   [name] [name] today has no headache,no chest abdominal pain,no new weakness tingling or numbness, feels much better wants to go home today.  Objective:   Blood pressure 117/69, pulse 79, temperature 99.8 F (37.7 C), temperature source Oral, resp. rate 18, height 5\' 8"  (1.727 m), weight (!) 185.2 kg, SpO2 96 %.  No intake or output data in the 24 hours ending 10/31/18 1257  Exam Awake Alert, Oriented x 3, No new F.N deficits, Normal affect [place].AT,PERRAL Supple Neck,No JVD, No cervical lymphadenopathy appriciated.  Symmetrical Chest wall movement, Good air movement bilaterally, CTAB RRR,No Gallops,Rubs or new Murmurs, No Parasternal Heave +ve B.Sounds, Abd Soft, Non tender, No organomegaly appriciated, No rebound -guarding or rigidity. No Cyanosis, Clubbing or edema, No new Rash or bruise  Data Review   CBC w Diff:  Lab Results  Component Value Date   WBC 5.3 10/28/2018   HGB 11.6 (L) 10/28/2018   HGB 13.0 09/12/2018   HCT 35.3 (L) 10/28/2018   HCT 40.1 09/12/2018   PLT 166 10/28/2018   PLT 200 09/12/2018   LYMPHOPCT 14 10/23/2018   MONOPCT 10 10/23/2018   EOSPCT 1 10/23/2018   BASOPCT 1 10/23/2018    CMP:  Lab Results  Component Value Date   NA 134 (L) 10/27/2018   NA 135 09/12/2018   K 4.3 10/27/2018   CL 95 (L) 10/27/2018   CO2 29 10/27/2018   BUN 15  10/27/2018   BUN 15 09/12/2018   CREATININE 1.14 10/28/2018   PROT 7.5 10/23/2018   PROT 7.3 09/12/2018   ALBUMIN 3.6 10/23/2018   ALBUMIN 4.2 09/12/2018   BILITOT 0.9 10/23/2018   BILITOT 0.5 09/12/2018   ALKPHOS 79 10/23/2018   AST 34 10/23/2018   ALT 16 10/23/2018  .   Total Time in preparing paper work, data evaluation and todays exam - 35 minutes  Katha Hamming M.D on 10/28/2018 at  12:57 PM    Note: This dictation was prepared with Dragon dictation along with smaller phrase technology. Any transcriptional errors that result from this process are unintentional.

## 2018-10-31 NOTE — Telephone Encounter (Signed)
I have made the 2nd attempt to contact the patient or family member in charge, in order to follow up from recently being discharged from the hospital.  

## 2018-11-01 ENCOUNTER — Other Ambulatory Visit: Payer: Self-pay

## 2018-11-01 ENCOUNTER — Ambulatory Visit (INDEPENDENT_AMBULATORY_CARE_PROVIDER_SITE_OTHER): Payer: Medicaid Other | Admitting: Internal Medicine

## 2018-11-01 ENCOUNTER — Encounter: Payer: Self-pay | Admitting: Internal Medicine

## 2018-11-01 VITALS — BP 132/75 | HR 65 | Ht 68.0 in | Wt >= 6400 oz

## 2018-11-01 DIAGNOSIS — I5022 Chronic systolic (congestive) heart failure: Secondary | ICD-10-CM | POA: Diagnosis not present

## 2018-11-01 DIAGNOSIS — I4819 Other persistent atrial fibrillation: Secondary | ICD-10-CM | POA: Diagnosis not present

## 2018-11-01 DIAGNOSIS — I2699 Other pulmonary embolism without acute cor pulmonale: Secondary | ICD-10-CM | POA: Diagnosis not present

## 2018-11-01 NOTE — Progress Notes (Signed)
Follow-up Outpatient Visit Date: 11/01/2018  Primary Care Provider: Dorcas Carrow, DO [address] [place] [address]  Chief Complaint: Shortness of breath  HPI:  [name] [name] is a 58 y.o. year-old male with history of chronic HFrEF due to NICM ( per patient to clean caths, most recently 1 year ago in [place], [place]), persistent atrial fibrillation, COPD, PE, morbid obesity and obstructive sleep apnea, who presents for follow-up of chest pain, heart failure, and atrial fibrillation.  He was last seen in our office by Edd Fabian, NP, in July, at which time he reported improved shortness of breath, though he continued to have some exertional dyspnea.  Leg edema was also improved.  Due to elevated BUN/creatinine, torsemide was decreased to 40 mg daily.  He was hospitalized earlier this month with acute pulmonary embolism (despite having been on rivaroxaban for atrial fibrillation and prior PE).  He was placed on heparin infusion with transition to warfarin.  Today, [name] [name] reports that he is back to baseline in regard to his shortness of breath.  He had acute worsening of his shortness os breath accompanied by left-sided chest pain and dizziness last week, which brought him to the ED.  With treatment of his PE, chest pain and dyspnea have improved considerably.  He reports stable leg swelling, though his weight is up ~5 pounds.  He is compliant with his medications.  He denies palpitations and further lightheadedness.  INR is being managed by home health.  He also underwent sleep study last night to allow for reassessment of his OSA/CPAP.  Had sleep study last night.  --------------------------------------------------------------------------------------------------  Past Medical History:  Diagnosis Date  . Asthma   . Brain damage   . Chronic pain of both knees 07/13/2018  . COPD (chronic obstructive pulmonary disease) (HCC)   . Depression   . HFrEF (heart failure with reduced  ejection fraction) (HCC)    a. 03/2018 Echo: EF 25-30%, diff HK. Mod dil LA.  [name] Hypertension   . Neck pain 07/13/2018  . Persistent atrial fibrillation    a. 03/2018 s/p DCCV; b. CHA2DS2VASc = 1-->Xarelto; c. 05/2018 recurrent afib-->Amio initiated;   . Sleep apnea    Past Surgical History:  Procedure Laterality Date  . CARDIOVERSION N/A 03/24/2018   Procedure: CARDIOVERSION;  Surgeon: Antonieta Iba, MD;  Location: [facility] ORS;  Service: Cardiovascular;  Laterality: N/A;  . CARDIOVERSION N/A 08/08/2018   Procedure: CARDIOVERSION;  Surgeon: [name], MD;  Location: [facility] ORS;  Service: Cardiovascular;  Laterality: N/A;  . hearnia repair     X 3- total of two surgeries  . LEG SURGERY      Current Meds  Medication Sig  . albuterol (VENTOLIN HFA) 108 (90 Base) MCG/ACT inhaler Inhale 2 puffs into the lungs every 6 (six) hours as needed for wheezing or shortness of breath.  . allopurinol (ZYLOPRIM) 100 MG tablet Take 1 tablet (100 mg total) by mouth daily.  [name] amiodarone (PACERONE) 200 MG tablet Take 1 tablet (200 mg total) by mouth daily.  [name] atorvastatin (LIPITOR) 40 MG tablet TAKE 1 TABLET BY MOUTH ONCE DAILY (Patient taking differently: Take 40 mg by mouth daily. )  . DULoxetine (CYMBALTA) 20 MG capsule Take 20 mg by mouth daily.  . ferrous sulfate 325 (65 FE) MG tablet Take 1 tablet (325 mg total) by mouth 2 (two) times daily with a meal.  . levothyroxine (SYNTHROID) 50 MCG tablet Take 1 tablet (50 mcg total) by mouth daily before  breakfast.  . lisinopril (ZESTRIL) 2.5 MG tablet Take 2.5 mg by mouth daily.  . meloxicam (MOBIC) 15 MG tablet Take 1 tablet (15 mg total) by mouth daily.  . metoprolol succinate (TOPROL-XL) 50 MG 24 hr tablet Take 50 mg by mouth 2 (two) times daily. Take with or immediately following a meal.  . nitroGLYCERIN (NITROSTAT) 0.4 MG SL tablet Place 1 tablet (0.4 mg total) under the tongue every 5 (five) minutes as needed for chest pain.  [name]. oxyCODONE 10 MG TABS Take  1 tablet (10 mg total) by mouth every 4 (four) hours as needed for moderate pain.  [name]. spironolactone (ALDACTONE) 25 MG tablet Take 1 tablet (25 mg total) by mouth daily.  [name]. torsemide (DEMADEX) 20 MG tablet Take 40 mg by mouth daily.  . traZODone (DESYREL) 150 MG tablet TAKE 1 TABLET BY MOUTH AT BEDTIME AS NEEDED SLEEP  . warfarin (COUMADIN) 5 MG tablet Take 1 tablet (5 mg total) by mouth daily.    Allergies: Patient has no known allergies.  Social History   Tobacco Use  . Smoking status: Never Smoker  . Smokeless tobacco: Never Used  Substance Use Topics  . Alcohol use: Never    Frequency: Never  . Drug use: Never    Family History  Problem Relation Age of Onset  . Heart failure Mother   . Lung cancer Mother   . Lung cancer Father   . Heart attack Maternal Grandmother   . Heart attack Maternal Grandfather     Review of Systems: A 12-system review of systems was performed and was negative except as noted in the HPI.  --------------------------------------------------------------------------------------------------  Physical Exam: BP 132/75 (BP Location: Left Arm, Patient Position: Sitting, Cuff Size: Large)   Pulse 65   Ht 5\' 8"  (1.727 m)   Wt (!) 405 lb (183.7 kg)   SpO2 97%   BMI 61.58 kg/m   General:  NAD. HEENT: No conjunctival pallor or scleral icterus. Facemask in place Neck: Supple without lymphadenopathy, thyromegaly.  Unable to assess JVP due to body habitus. Lungs: Normal work of breathing. Mildly diminished breath sounds throughout.  No wheezes or crackles. Heart: Distant heart sounds.  RRR w/o murmurs.  Unable to assess PMI due to body habitus. Abd: Bowel sounds present. Soft, NT/ND.  Unable to assess HSM due to body habitus. Ext: Both calves wrapped with trace to 1+ edema noted just below the knee. Skin: Warm and dry.  EKG:  NSR with 1st degree AV block, left axis deviation, and LBBB.  Lab Results  Component Value Date   WBC 5.3 10/28/2018   HGB  11.6 (L) 10/28/2018   HCT 35.3 (L) 10/28/2018   MCV 91.0 10/28/2018   PLT 166 10/28/2018    Lab Results  Component Value Date   NA 134 (L) 10/27/2018   K 4.3 10/27/2018   CL 95 (L) 10/27/2018   CO2 29 10/27/2018   BUN 15 10/27/2018   CREATININE 1.14 10/28/2018   GLUCOSE 148 (H) 10/27/2018   ALT 16 10/23/2018    Lab Results  Component Value Date   CHOL 134 08/07/2018   HDL 54 08/07/2018   LDLCALC 49 08/07/2018   TRIG 157 (H) 08/07/2018   CHOLHDL 2.5 08/07/2018    --------------------------------------------------------------------------------------------------  ASSESSMENT AND PLAN: Chronic systolic heart failure: Volume exam is challenging due to body habitus but weight is up ~5 pounds from recent hospitalization.  I suggested increasing torsemide to 40 mg BID (at least temporarily), but [name] [name] refused  due to ongoing urinary frequency.  I advised him to begin taking it BID if he gains further weight.  We will continue current doses of metoprolol succinate, lisinopril, and spironolactone.  If BP remains stable, we can consider increasing lisinopril in the future.  Acute pulmonary embolism: Symptoms back to baseline.  He will need to remain on indefinite anticoagulation with warfarin, given recurrent PE while on rivaroxaban.  Persistent atrial fibrillation: Patient maintaining sinus rhythm following DCCV and on amiodarone.  Continue amiodarone, metoprolol, and warfarin.  Morbid obesity: Weight loss encouraged through diet and exercise.  Follow-up: RTC 1 month.  Nelva Bush, MD 11/01/2018 4:03 PM

## 2018-11-01 NOTE — Patient Instructions (Signed)
Medication Instructions:  Your physician recommends that you continue on your current medications as directed. Please refer to the Current Medication list given to you today.  If your weight is up 2lbs in a day or 5lbs in a week INCREASE your Torsemide to 40mg  twice daily until you are back down to your baseline weight.  If you need a refill on your cardiac medications before your next appointment, please call your pharmacy.   Lab work: None ordered If you have labs (blood work) drawn today and your tests are completely normal, you will receive your results only by: [name] MyChart Message (if you have MyChart) OR . A paper copy in the mail If you have any lab test that is abnormal or we need to change your treatment, we will call you to review the results.  Testing/Procedures: None ordered  Follow-Up: At [facility], you and your health needs are our priority.  As part of our continuing mission to provide you with exceptional heart care, we have created designated Provider Care Teams.  These Care Teams include your primary Cardiologist (physician) and Advanced Practice Providers (APPs -  Physician Assistants and Nurse Practitioners) who all work together to provide you with the care you need, when you need it. You will need a follow up appointment in 4 weeks.  You may see Nelva Bush, MD or one of the following Advanced Practice Providers on your designated Care Team:   Murray Hodgkins, NP Christell Faith, PA-C . Marrianne Mood, PA-C  Any Other Special Instructions Will Be Listed Below (If Applicable). N/A

## 2018-11-06 ENCOUNTER — Ambulatory Visit: Payer: Self-pay | Admitting: Licensed Clinical Social Worker

## 2018-11-06 ENCOUNTER — Telehealth: Payer: Self-pay

## 2018-11-06 DIAGNOSIS — I872 Venous insufficiency (chronic) (peripheral): Secondary | ICD-10-CM | POA: Diagnosis not present

## 2018-11-06 NOTE — Telephone Encounter (Signed)
Needs INR appointment ASAP if not being managed by cardiology

## 2018-11-06 NOTE — Telephone Encounter (Signed)
Called and spoke with patient. Message relayed. Patient stated that he will call back to the office to schedule an f/u OV.

## 2018-11-06 NOTE — Chronic Care Management (AMB) (Signed)
  Care Management   Follow Up Note   11/06/2018 Name: [name] [name] MRN: [id number] DOB: 1961-01-10  Referred by: [name], DO Reason for referral : Care Coordination   [name] is a 58 y.o. year old male who is a primary care patient of [name], DO. The care management team was consulted for assistance with care management and care coordination needs.    Review of patient status, including review of consultants reports, relevant laboratory and other test results, and collaboration with appropriate care team members and the patient's provider was performed as part of comprehensive patient evaluation and provision of chronic care management services.    LCSW completed CCM outreach attempt today but was unable to reach patient successfully. A HIPPA compliant voice message was left encouraging patient to return call once available. LCSW rescheduled CCM SW appointment as well.   A HIPPA compliant phone message was left for the patient providing contact information and requesting a return call.   [name], BSW, MSW, [facility] [email or web address] Phone: [phone]

## 2018-11-07 ENCOUNTER — Ambulatory Visit: Payer: Self-pay | Admitting: Licensed Clinical Social Worker

## 2018-11-07 ENCOUNTER — Telehealth: Payer: Self-pay | Admitting: Family Medicine

## 2018-11-07 DIAGNOSIS — F321 Major depressive disorder, single episode, moderate: Secondary | ICD-10-CM

## 2018-11-07 NOTE — Telephone Encounter (Signed)
° ° °  Called pt regarding [employer] Referral for financial help and food.  Discussed fraud report that he filed on his sister 2 months ago and he talked about how she had been claiming his as a dependent and also is accusing her of taking his stimulus check. He stated that his costs have increased since moving from [place] and that he had more assistance with cellphone bill, cost of drugs etc.  He pays $60/month for over a dozen Rx and struggles to make all of his other monthly bill payments including $300 rent utilities, groceries, gas etc. 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  Will apply for [facility] to help pay for October rent. Will send in referral for Meals on Wheels  Additional Notes:  Note for Dr. Wynetta Emery and Jonelle Sidle Reel: Patient stated that he was told that Dr. Wynetta Emery would like for him to come in this week regarding bloodwork, he said that the [place] Nurse from [facility] care comes on Thursday and will be checking his labs. I told him that you would reach out if he needed to be seen before his visit with Dr. Wynetta Emery on 10/13 at [facility].  Note for [name]: Patient stated that [name] has denied services to help with household chores, he was expecting a call back from them on the 14th or 15th, he has called 3x and has not heard back, Brooke could you please follow-up?  [place]  ??[email or web address]  ?? [phone]   Skype

## 2018-11-07 NOTE — Telephone Encounter (Signed)
Patient called back and was scheduled for 11/10/18 per front office.

## 2018-11-07 NOTE — Telephone Encounter (Signed)
LVM for patient to schedule appointment. Will try again.

## 2018-11-07 NOTE — Chronic Care Management (AMB) (Signed)
  Care Management   Follow Up Note   11/07/2018 Name: [name] [name] MRN: [id number] DOB: [birth year]  Referred by: [name], DO Reason for referral : Care Coordination   [name] [name] is a 58 y.o. year old male who is a primary care patient of [name], DO. The care management team was consulted for assistance with care management and care coordination needs.    Review of patient status, including review of consultants reports, relevant laboratory and other test results, and collaboration with appropriate care team members and the patient's provider was performed as part of comprehensive patient evaluation and provision of chronic care management services.    SDOH (Social Determinants of Health) screening performed today: Financial Strain . See Care Plan for related entries.   Goals Addressed    . SW-I need financial assistance (pt-stated)       Current Barriers:  . Financial constraints . Limited social support . Level of care concerns . ADL IADL limitations . Mental Health Concerns  . Limited education about how to apply for disability* . Limited access to caregiver . Lacks knowledge of community resource: PCS enrollment  Clinical Social Work Clinical Goal(s):  [name] Over the next 90 days, client will work with SW to address concerns related to gaining an aide through [facility]  Interventions: . Patient interviewed and appropriate assessments performed . Provided mental health counseling with regard to patient's loss of spouse. Active and reflective listening provided.   . Provided patient with information about the disability enrollment process. . Discussed plans with patient for ongoing care management follow up and provided patient with direct contact information for care management team . Advised patient to keep his phone nearby as a caseworker from [facility] will contact him to schedule assessment  . Patient is still in need of [facility]. . Patient reports that his sister ended up with his stimulus check and is not willing to give it back as his sister claims him (unknown to his knowledge). Patient filing a fraud report.  . Assisted patient/caregiver with obtaining information about health plan benefits . Provided education and assistance to client regarding Advanced Directives. . Provided education to patient/caregiver regarding level of care options.  Patient Self Care Activities:  . Attends all scheduled provider appointments . Calls provider office for new concerns or questions  Please see past updates related to this goal by clicking on the "Past Updates" button in the selected goal      The care management team will reach out to the patient again over the next 30-45 days.  [name], BSW, MSW, [facility] Care Management [email or web address] Phone: [phone]

## 2018-11-07 NOTE — Telephone Encounter (Signed)
Called Sister - In - law to gather information regarding Secondary school teacher - and to get w9. No answer will try again - knb

## 2018-11-07 NOTE — Telephone Encounter (Signed)
-----   Message from Leo Rod sent at 11/07/2018  3:37 PM EDT ----- Patient stated that he was told that Dr. Wynetta Emery would like for him to come in this week regarding bloodwork, he said that the [place] Nurse from [facility] care comes on Thursday and will be checking his labs.   I told him that you would reach out if he needed to be seen before his visit with Dr. Wynetta Emery on 10/13 at [facility]. Thanks, Jill Alexanders

## 2018-11-07 NOTE — Telephone Encounter (Signed)
Again, patient needs hospital follow up for coumadin management and in-house INR ASAP. NOT ok to wait until 10/13.

## 2018-11-09 ENCOUNTER — Telehealth: Payer: Self-pay

## 2018-11-09 DIAGNOSIS — I872 Venous insufficiency (chronic) (peripheral): Secondary | ICD-10-CM | POA: Diagnosis not present

## 2018-11-09 NOTE — Telephone Encounter (Signed)
Copied from Christine [id number]. Topic: General - Inquiry >> Nov 09, 2018  2:02 PM Mathis Bud wrote: Reason for CRM: Lattie Haw from [facility] called to report an INR report: 1.5 - current on 5 mg of COUMADIN daily. Call back [phone]   Routing to provider.

## 2018-11-09 NOTE — Telephone Encounter (Signed)
Email to Property Manager/Pt's Sister-in-law  From: Jill Alexanders [facility])  Sent: Thursday, November 09, 2018 11:58 AM To: [email or web address] Subject: Secure: [name] [name] for [name] [name] Importance: High  Good Morning Ms. Rohner, In order for [place] to submit the application for [name] [name] rent payment for October 2020, will you please respond with the following information below and include a copy of signed W9 (see attached):    Remit Payment to: [name] [name] Address:   Tenant: [name] [name] Tenant Address:   [address], [name], [name]  [address] Amount Due: $300 Amount Due Date: October 1st    Please let me know if you have any questions, if you could please have this to me by tomorrow (9/25) I can go ahead and submit the application.  Thank you,   [place]  ??[email or web address]   ??[phone]   ?Skype

## 2018-11-09 NOTE — Telephone Encounter (Signed)
Spoke with pt he said that his sister in law is going to be at his house this morning and they will call me back when she arrives to discuss w9 and info to submit with application to assist in rent payment  [place]  ??[email or web address]  ?? [phone]   Skype

## 2018-11-09 NOTE — Telephone Encounter (Signed)
Spoke with pt's sister in law she returned my call Email Address [email or web address]: [employer] Confirmed October rent payment of $300.

## 2018-11-09 NOTE — Telephone Encounter (Signed)
° ° °  Called pt's sister regarding [employer] Referral for [employer], no vm box set up will call pt to see if he has another #.

## 2018-11-09 NOTE — Telephone Encounter (Signed)
° °  Meals on Wheels Confirmed, beginning October 1st.   From: [name] [name] [email or web address]>  Sent: Thursday, November 09, 2018 10:03 AM To: [name] [name] [facility]) [email or web address]> Cc: [name] [name] [email or web address]> Subject: RE: SECURE: Referral for Meals on [employer]   *Caution - External email - see footer for warnings* Good Morning, [name] [name],  I was out yesterday.  I spoke with [name] [name].  His meals are going to start on Thursday, October 1st.  His meals will end Wednesday, December 23rd.    Thanks! [name] [name]  From: [name] [name] [facility]) [email or web address]>  Sent: Tuesday, November 07, 2018 3:59 PM To: [name] [name] [email or web address]> Cc: [name] [name] [email or web address]> Subject: SECURE: Referral for Meals on [employer]   This message was sent securely by [facility].       Good Afternoon [name] [name], Please add [name] [name] to the [facility]OW delivery route for meals under the [place].  [name] [name] [address], [place], [place] [address] [facility] Resident) [phone]  I informed him that a representative from [place] would be reaching out to him to ask a series of questions.  I am including our [place] Clinical Social Worker, [name] [name] so that she can approve the [facility] referral and specify length of delivery of service.   Thank you and please reach out if you have questions.  [name] [name]  ??[name]   ??[phone]   ?Skype    NOTICE: This message may contain confidential information intended only for the recipient. If you have received this communication in error, please notify the sender immediately by replying to the message and deleting it from your computer.   This message was secured by [employer].  WARNING: This email originated outside of [facility]. Even if this looks like a [employer], it is not. Do not provide your username, password, or any other personal information in response to this or any other email. [facility] will never ask you for your username or password via email. DO NOT CLICK links or attachments unless you are positive the content is safe. If in doubt about the safety of this message, select the [employer] Report Phishing button, which forwards to IT Security.   NOTICE: This message may contain confidential information intended only for the recipient. If you have received this communication in error, please notify the sender immediately by replying to the message and deleting it from your computer.   This message was secured by [employer].  WARNING: This email originated outside of [facility]. Even if this looks like a [employer], it is not. Do not provide your username, password, or any other personal information in response to this or any other email. [facility] will never ask you for your username or password via email. DO NOT CLICK links or attachments unless you are positive the content is safe. If in doubt about the safety of this message, select the [employer] Report Phishing button, which forwards to IT Security. [place]  ??[name]  ?? [phone]   Skype

## 2018-11-10 ENCOUNTER — Ambulatory Visit: Payer: Self-pay | Admitting: *Deleted

## 2018-11-10 ENCOUNTER — Other Ambulatory Visit: Payer: Self-pay

## 2018-11-10 ENCOUNTER — Ambulatory Visit (INDEPENDENT_AMBULATORY_CARE_PROVIDER_SITE_OTHER): Payer: Medicaid Other | Admitting: Family Medicine

## 2018-11-10 ENCOUNTER — Encounter: Payer: Self-pay | Admitting: Family Medicine

## 2018-11-10 VITALS — BP 118/73 | HR 62 | Temp 98.7°F | Ht 68.0 in

## 2018-11-10 DIAGNOSIS — R791 Abnormal coagulation profile: Secondary | ICD-10-CM

## 2018-11-10 DIAGNOSIS — I482 Chronic atrial fibrillation, unspecified: Secondary | ICD-10-CM

## 2018-11-10 DIAGNOSIS — I4819 Other persistent atrial fibrillation: Secondary | ICD-10-CM

## 2018-11-10 DIAGNOSIS — I2699 Other pulmonary embolism without acute cor pulmonale: Secondary | ICD-10-CM

## 2018-11-10 DIAGNOSIS — Z86711 Personal history of pulmonary embolism: Secondary | ICD-10-CM

## 2018-11-10 LAB — COAGUCHEK XS/INR WAIVED
INR: 1.5 — ABNORMAL HIGH (ref 0.9–1.1)
Prothrombin Time: 18 s

## 2018-11-10 MED ORDER — ENOXAPARIN SODIUM 150 MG/ML ~~LOC~~ SOLN: 150.0000 mg | Freq: Two times a day (BID) | SUBCUTANEOUS | 1 refills | Status: DC

## 2018-11-10 MED ORDER — ENOXAPARIN SODIUM 100 MG/ML ~~LOC~~ SOLN: 150.0000 mg | Freq: Two times a day (BID) | SUBCUTANEOUS | 1 refills | Status: DC

## 2018-11-10 NOTE — Progress Notes (Signed)
BP 118/73   Pulse 62   Temp 98.7 F (37.1 C) (Oral)   Ht 5\' 8"  (1.727 m)   SpO2 94%   BMI 61.58 kg/m    Subjective:    Patient ID: [name] [name], male    DOB: 03/27/1960, 58 y.o.   MRN: [id number]  HPI: [name] [name] is a 58 y.o. male  Chief Complaint  Patient presents with  . Hospitalization Follow-up   Transition of Care Hospital Follow up.   Hospital/Facility: [facility] D/C Physician: Dr. Luberta MutterKonidena D/C Date: 10/28/18  Records Requested: 11/10/18 Records Received: 11/10/18 Records Reviewed: 11/10/18  Diagnoses on Discharge:  Acute Pulmonary Embolism  Date of interactive Contact within 48 hours of discharge: 10/30/18 Contact was through: phon  Date of 7 day or 14 day face-to-face visit:  11/10/18  within 14 days  Outpatient Encounter Medications as of 11/10/2018  Medication Sig  . albuterol (VENTOLIN HFA) 108 (90 Base) MCG/ACT inhaler Inhale 2 puffs into the lungs every 6 (six) hours as needed for wheezing or shortness of breath.  . allopurinol (ZYLOPRIM) 100 MG tablet Take 1 tablet (100 mg total) by mouth daily.  [name]. amiodarone (PACERONE) 200 MG tablet Take 1 tablet (200 mg total) by mouth daily.  [name]. atorvastatin (LIPITOR) 40 MG tablet TAKE 1 TABLET BY MOUTH ONCE DAILY (Patient taking differently: Take 40 mg by mouth daily. )  . DULoxetine (CYMBALTA) 20 MG capsule Take 20 mg by mouth daily.  . ferrous sulfate 325 (65 FE) MG tablet Take 1 tablet (325 mg total) by mouth 2 (two) times daily with a meal.  . levothyroxine (SYNTHROID) 50 MCG tablet Take 1 tablet (50 mcg total) by mouth daily before breakfast.  . lisinopril (ZESTRIL) 2.5 MG tablet Take 2.5 mg by mouth daily.  . meloxicam (MOBIC) 15 MG tablet Take 1 tablet (15 mg total) by mouth daily.  . metoprolol succinate (TOPROL-XL) 50 MG 24 hr tablet Take 50 mg by mouth 2 (two) times daily. Take with or immediately following a meal.  . nitroGLYCERIN (NITROSTAT) 0.4 MG SL tablet Place 1 tablet (0.4 mg total) under the tongue  every 5 (five) minutes as needed for chest pain.  [name]. spironolactone (ALDACTONE) 25 MG tablet Take 1 tablet (25 mg total) by mouth daily.  [name]. torsemide (DEMADEX) 20 MG tablet Take 40 mg by mouth daily.  . traZODone (DESYREL) 150 MG tablet TAKE 1 TABLET BY MOUTH AT BEDTIME AS NEEDED SLEEP  . warfarin (COUMADIN) 5 MG tablet Take 1 tablet (5 mg total) by mouth daily.  [name]. enoxaparin (LOVENOX) 100 MG/ML injection Inject 1.5 mLs (150 mg total) into the skin every 12 (twelve) hours.  [name]. oxyCODONE 10 MG TABS Take 1 tablet (10 mg total) by mouth every 4 (four) hours as needed for moderate pain. (Patient not taking: Reported on 11/10/2018)  . [DISCONTINUED] enoxaparin (LOVENOX) 150 MG/ML injection Inject 1 mL (150 mg total) into the skin every 12 (twelve) hours.  . [DISCONTINUED] enoxaparin (LOVENOX) 150 MG/ML injection Inject 1 mL (150 mg total) into the skin every 12 (twelve) hours.   No facility-administered encounter medications on file as of 11/10/2018.   Per hospitalist: "HPI  from the history and physical done on the day of admission 58-year-old male patient with history of chronic systolic heart failure, COPD, chronic atrial fibrillation on Xarelto for anticoagulation, hypertension, sleep apnea currently under hospitalist service for chest discomfort. CTA chest revealed a left lung PE   Hospital Course  58 year old male patient with history of  chronic systolic heart failure, COPD, chronic atrial fibrillation on Xarelto for anticoagulation, hypertension, sleep apnea currently under hospitalist service for chest discomfort. CTA chest revealed a left lung PE. -Acute pulmonary embolism Because of increased BMI greater than 40 started patient IV heparin drip and anticoagulated with oral Coumadin,After INR is therauptic,we stopped Heparin drip and  Sent home with coumadin., I Venous Doppler ultrasound of lower extremities no dvt CTA chest showed no right heart strain  -Chest discomfort could be  secondary to pulmonary embolismimproved Anticoagulate patient with heparin drip  -COPD appears stable Home dose inhalers  -Sleep apnea CPAP at bedtime  -Atrial fibrillation'intially was on heparin but after INR is more than 2.discharged home with coumadin.Home health to follow INR  At home and monitor coumadin  Dose.advised to stop xarelto, Continue oral amiodarone  -Chronic systolic heart failure Continue torsemide and Aldactone  -Urinary retention Start flomax Urology evaluation as patient requested for buried penis"             Diagnostic Tests Reviewed:  CLINICAL DATA:  LEFT side chest pain beginning at noon today, nausea, history COPD, atrial fibrillation, heart failure  EXAM: CHEST - 2 VIEW  COMPARISON:  08/06/2018  FINDINGS: Borderline enlargement of cardiac silhouette.  Mediastinal contours and pulmonary vascularity normal.  Lungs clear.  No pleural effusion or pneumothorax.  Bones unremarkable.  IMPRESSION: No acute abnormalities.  CLINICAL DATA:  Initial evaluation for acute generalized abdominal pain.  EXAM: CT ABDOMEN AND PELVIS WITH CONTRAST  TECHNIQUE: Multidetector CT imaging of the abdomen and pelvis was performed using the standard protocol following bolus administration of intravenous contrast.  CONTRAST:  180mL OMNIPAQUE IOHEXOL 300 MG/ML  SOLN  COMPARISON:  None available.  FINDINGS: Lower chest: Visualized lung bases are clear.  Hepatobiliary: Liver demonstrates a normal contrast enhanced appearance. Gallbladder within normal limits. No biliary dilatation.  Pancreas: Mild diffuse fatty infiltration the pancreas noted. Pancreas otherwise unremarkable without acute peripancreatic inflammation. No abnormal pancreatic ductal dilatation.  Spleen: Spleen within normal limits.  Adrenals/Urinary Tract: Adrenal glands are normal. Kidneys somewhat atrophic in appearance but equal in size with  symmetric enhancement bilaterally. Subcentimeter hypodensity within the interpolar right kidney too small the characterize, but statistically likely reflects a small cyst. No nephrolithiasis or hydronephrosis. No focal enhancing renal mass. No hydroureter. Bladder largely decompressed without acute finding.  Stomach/Bowel: Stomach within normal limits. No evidence for bowel obstruction. Appendix within normal limits. Scattered mild colonic diverticulosis without evidence for acute diverticulitis. No acute inflammatory changes seen about the bowels.  Vascular/Lymphatic: Normal intravascular enhancement seen throughout the intra-abdominal aorta. Mild aorto bi-iliac atherosclerotic disease. Mesenteric vessels patent proximally. No adenopathy.  Reproductive: Negative prostate.  Other: No free air or fluid.  Musculoskeletal: No acute osseous abnormality. No discrete lytic or blastic osseous lesions. Moderate multilevel degenerative spondylolysis noted within the visualized thoracolumbar spine. Moderate osteoarthritic changes noted about the right hip.  IMPRESSION: 1. No CT evidence for acute intra-abdominal or pelvic process. 2. Mild colonic diverticulosis without evidence for acute diverticulitis. 3. Mild aorto bi-iliac atherosclerotic disease.  No aneurysm.  CLINICAL DATA:  Chest pain  EXAM: CT ANGIOGRAPHY CHEST WITH CONTRAST  TECHNIQUE: Multidetector CT imaging of the chest was performed using the standard protocol during bolus administration of intravenous contrast. Multiplanar CT image reconstructions and MIPs were obtained to evaluate the vascular anatomy.  CONTRAST:  132mL OMNIPAQUE IOHEXOL 350 MG/ML SOLN  COMPARISON:  Chest radiograph October 23, 2018  FINDINGS: Cardiovascular: There are several subsegmental branch pulmonary emboli in the  left lower lobe. No more central pulmonary emboli identified. No right heart strain apparent. There is no  appreciable thoracic aortic aneurysm or dissection. The visualized great vessels appear unremarkable except for scattered occasional atherosclerotic calcifications. Note that the right innominate and left common carotid arteries arise as a common trunk, an anatomic variant. There is aortic atherosclerosis as well as foci of coronary artery calcification. There is no pericardial effusion or pericardial thickening.  Mediastinum/Nodes: Thyroid appears unremarkable. No adenopathy is evident. No esophageal lesions are appreciable.  Lungs/Pleura: There is mild bibasilar atelectasis. There is no edema or consolidation. No pleural effusion evident.  Upper Abdomen: Visualized upper abdominal structures appear unremarkable.  Musculoskeletal: There is extensive arthropathy in the lower thoracic region with sclerosis in multiple lower thoracic vertebral bodies. No similar changes noted elsewhere. No lytic or destructive bone lesions.  Review of the MIP images confirms the above findings.  IMPRESSION: 1. There are several small peripheral pulmonary emboli in left lower lobe pulmonary arterial vessels. No more central pulmonary embolus evident. No right heart strain.  2. There is aortic atherosclerosis. No thoracic aortic aneurysm or dissection. There are foci of coronary artery calcification.  3.  Mild bibasilar atelectasis.  No edema or consolidation.  4.  No demonstrable adenopathy.  5. Extensive sclerosis involving several lower thoracic and upper lumbar vertebral bodies. These changes may be due to advanced arthropathy. Given the degree of sclerosis in these areas, correlation with clinical prostate examination and PSA advised.  Critical Value/emergent results were called by telephone at the time of interpretation on 10/24/2018 at 8:48 am to Dr. Tobi Bastos , who verbally acknowledged these results.  Aortic Atherosclerosis (ICD10-I70.0).  CLINICAL DATA:  History of DVT  and pulmonary embolism. Elevated D-dimer. Evaluate for acute or chronic DVT.  EXAM: BILATERAL LOWER EXTREMITY VENOUS DOPPLER ULTRASOUND  TECHNIQUE: Gray-scale sonography with graded compression, as well as color Doppler and duplex ultrasound were performed to evaluate the lower extremity deep venous systems from the level of the common femoral vein and including the common femoral, femoral, profunda femoral, popliteal and calf veins including the posterior tibial, peroneal and gastrocnemius veins when visible. The superficial great saphenous vein was also interrogated. Spectral Doppler was utilized to evaluate flow at rest and with distal augmentation maneuvers in the common femoral, femoral and popliteal veins.  COMPARISON:  None.  FINDINGS: Examination is degraded due to patient body habitus and poor sonographic window.  RIGHT LOWER EXTREMITY  Common Femoral Vein: No evidence of thrombus. Normal compressibility, respiratory phasicity and response to augmentation.  Saphenofemoral Junction: No evidence of thrombus. Normal compressibility and flow on color Doppler imaging.  Profunda Femoral Vein: No evidence of thrombus. Normal compressibility and flow on color Doppler imaging.  Femoral Vein: No evidence of thrombus. Normal compressibility, respiratory phasicity and response to augmentation.  Popliteal Vein: No evidence of thrombus. Normal compressibility, respiratory phasicity and response to augmentation.  Calf Veins: No evidence of thrombus. Normal compressibility and flow on color Doppler imaging.  Superficial Great Saphenous Vein: No evidence of thrombus. Normal compressibility.  Venous Reflux:  None.  Other Findings:  None.  LEFT LOWER EXTREMITY  Common Femoral Vein: No evidence of thrombus. Normal compressibility, respiratory phasicity and response to augmentation.  Saphenofemoral Junction: No evidence of thrombus. Normal compressibility  and flow on color Doppler imaging.  Profunda Femoral Vein: No evidence of thrombus. Normal compressibility and flow on color Doppler imaging.  Femoral Vein: No evidence of thrombus. Normal compressibility, respiratory phasicity and response to augmentation.  Popliteal Vein: No evidence of thrombus. Normal compressibility, respiratory phasicity and response to augmentation.  Calf Veins: No evidence of thrombus. Normal compressibility and flow on color Doppler imaging.  Superficial Great Saphenous Vein: No evidence of thrombus. Normal compressibility.  Venous Reflux:  None.  Other Findings:  None.  IMPRESSION: No evidence acute or chronic deep venous thrombosis within either lower extremity.  Disposition: Home  Consults: None  Discharge Instructions:  Follow up here within 1 week  Disease/illness Education: Given today  Home Health/Community Services Discussions/Referrals: Set up today  Establishment or re-establishment of referral orders for community resources:  In place  Discussion with other health care providers: None  Assessment and Support of treatment regimen adherence:  Fair  Appointments Coordinated with: Patient  Education for self-management, independent living, and ADLs:  Given today  Relevant past medical, surgical, family and social history reviewed and updated as indicated. Interim medical history since our last visit reviewed. Allergies and medications reviewed and updated.  Review of Systems  Constitutional: Positive for fatigue.  Respiratory: Positive for chest tightness and shortness of breath. Negative for apnea, cough, choking, wheezing and stridor.   Cardiovascular: Negative.   Gastrointestinal: Negative.   Neurological: Negative.   Psychiatric/Behavioral: Positive for dysphoric mood. Negative for agitation, behavioral problems, confusion, decreased concentration, hallucinations, self-injury and sleep disturbance. The patient is not  nervous/anxious and is not hyperactive.     Per HPI unless specifically indicated above     Objective:    BP 118/73   Pulse 62   Temp 98.7 F (37.1 C) (Oral)   Ht 5\' 8"  (1.727 m)   SpO2 94%   BMI 61.58 kg/m   Wt Readings from Last 3 Encounters:  11/01/18 (!) 405 lb (183.7 kg)  10/28/18 (!) 408 lb 6.4 oz (185.2 kg)  09/12/18 (!) 401 lb (181.9 kg)    Physical Exam Vitals signs and nursing note reviewed.  Constitutional:      General: He is not in acute distress.    Appearance: Normal appearance. He is obese. He is not ill-appearing, toxic-appearing or diaphoretic.  HENT:     Head: Normocephalic and atraumatic.     Right Ear: External ear normal.     Left Ear: External ear normal.     Nose: Nose normal.     Mouth/Throat:     Mouth: Mucous membranes are moist.     Pharynx: Oropharynx is clear.  Eyes:     General: No scleral icterus.       Right eye: No discharge.        Left eye: No discharge.     Extraocular Movements: Extraocular movements intact.     Conjunctiva/sclera: Conjunctivae normal.     Pupils: Pupils are equal, round, and reactive to light.  Neck:     Musculoskeletal: Normal range of motion and neck supple.  Cardiovascular:     Rate and Rhythm: Normal rate and regular rhythm.     Pulses: Normal pulses.     Heart sounds: Normal heart sounds. No murmur. No friction rub. No gallop.   Pulmonary:     Effort: Pulmonary effort is normal. No respiratory distress.     Breath sounds: Normal breath sounds. No stridor. No wheezing, rhonchi or rales.  Chest:     Chest wall: No tenderness.  Musculoskeletal: Normal range of motion.  Skin:    General: Skin is warm and dry.     Capillary Refill: Capillary refill takes less than 2 seconds.  Coloration: Skin is not jaundiced or pale.     Findings: No bruising, erythema, lesion or rash.  Neurological:     General: No focal deficit present.     Mental Status: He is alert and oriented to person, place, and time.  Mental status is at baseline.  Psychiatric:        Mood and Affect: Mood normal.        Behavior: Behavior normal.        Thought Content: Thought content normal.        Judgment: Judgment normal.     Results for orders placed or performed in visit on 11/10/18  CoaguChek XS/INR Waived  Result Value Ref Range   INR 1.5 (H) 0.9 - 1.1   Prothrombin Time 18.0 sec      Assessment & Plan:   Problem List Items Addressed This Visit      Cardiovascular and Mediastinum   Persistent atrial fibrillation   Relevant Medications   enoxaparin (LOVENOX) 100 MG/ML injection   Other Relevant Orders   CoaguChek XS/INR Waived (Completed)   Chronic atrial fibrillation    Did not take his coumadin for the first couple of days upon discharge as he wasn't able to pick up the Rx. He has been subtherapeutic. Given weight, will need to be anticoagulated with coumadin. Given subtherapeutic INR, offered return to the hospital- which he declined or bridging with lovenox until he is therapeutic again. We will bridge him with lovenox and increase his coumadin to 10mg  daily, we will recheck on Monday and adjust medications at that time.       Relevant Medications   enoxaparin (LOVENOX) 100 MG/ML injection   Acute pulmonary embolism (HCC) - Primary    Did not take his coumadin for the first couple of days upon discharge as he wasn't able to pick up the Rx. He has been subtherapeutic. Given weight, will need to be anticoagulated with coumadin. Given subtherapeutic INR, offered return to the hospital- which he declined or bridging with lovenox until he is therapeutic again. We will bridge him with lovenox and increase his coumadin to 10mg  daily, we will recheck on Monday and adjust medications at that time.       Relevant Medications   enoxaparin (LOVENOX) 100 MG/ML injection     Other   History of pulmonary embolism (on Xarelto)    Did not take his coumadin for the first couple of days upon discharge as he  wasn't able to pick up the Rx. He has been subtherapeutic. Given weight, will need to be anticoagulated with coumadin. Given subtherapeutic INR, offered return to the hospital- which he declined or bridging with lovenox until he is therapeutic again. We will bridge him with lovenox and increase his coumadin to 10mg  daily, we will recheck on Monday and adjust medications at that time.       Subtherapeutic international normalized ratio (INR)    Did not take his coumadin for the first couple of days upon discharge as he wasn't able to pick up the Rx. He has been subtherapeutic. Given weight, will need to be anticoagulated with coumadin. Given subtherapeutic INR, offered return to the hospital- which he declined or bridging with lovenox until he is therapeutic again. We will bridge him with lovenox and increase his coumadin to 10mg  daily, we will recheck on Monday and adjust medications at that time. Patient taught today how to administer his lovenox and first injection self-administered in the office. Warning signs for which  to go to the ER discussed today.          Follow up plan: Return Monday- follow up INR.

## 2018-11-11 ENCOUNTER — Encounter: Payer: Self-pay | Admitting: Family Medicine

## 2018-11-11 DIAGNOSIS — R791 Abnormal coagulation profile: Secondary | ICD-10-CM | POA: Insufficient documentation

## 2018-11-11 NOTE — Assessment & Plan Note (Signed)
Did not take his coumadin for the first couple of days upon discharge as he wasn't able to pick up the Rx. He has been subtherapeutic. Given weight, will need to be anticoagulated with coumadin. Given subtherapeutic INR, offered return to the hospital- which he declined or bridging with lovenox until he is therapeutic again. We will bridge him with lovenox and increase his coumadin to 10mg  daily, we will recheck on Monday and adjust medications at that time.

## 2018-11-11 NOTE — Assessment & Plan Note (Signed)
Did not take his coumadin for the first couple of days upon discharge as he wasn't able to pick up the Rx. He has been subtherapeutic. Given weight, will need to be anticoagulated with coumadin. Given subtherapeutic INR, offered return to the hospital- which he declined or bridging with lovenox until he is therapeutic again. We will bridge him with lovenox and increase his coumadin to 10mg  daily, we will recheck on Monday and adjust medications at that time. Patient taught today how to administer his lovenox and first injection self-administered in the office. Warning signs for which to go to the ER discussed today.

## 2018-11-11 NOTE — Assessment & Plan Note (Signed)
Did not take his coumadin for the first couple of days upon discharge as he wasn't able to pick up the Rx. He has been subtherapeutic. Given weight, will need to be anticoagulated with coumadin. Given subtherapeutic INR, offered return to the hospital- which he declined or bridging with lovenox until he is therapeutic again. We will bridge him with lovenox and increase his coumadin to 10mg daily, we will recheck on Monday and adjust medications at that time.  

## 2018-11-12 NOTE — Patient Instructions (Signed)
Thank you allowing the Chronic Care Management Team to be a part of your care! It was a pleasure speaking with you today!  CCM (Chronic Care Management) Team   Quincey Nored RN, BSN Nurse Care Coordinator  [phone]  [facility] PharmD  Clinical Pharmacist  [phone]  [name] LCSW Clinical Social Worker [phone]  Goals Addressed            This Visit's Progress   . RN-I need help managing my overall health (pt-stated)       Current Barriers:  . Lacks caregiver support.  . Film/video editor.  . Literacy barriers . Transportation barriers . Difficulty obtaining medications . Cognitive Deficits . Chronic Disease Management support and education needs related to managing multiple chronic diseases as evidenced by multiple hospitalizations in the last 6 months.  Nurse Case Manager Clinical Goal(s):  [name] Over the next 90 days, patient will not experience hospital admission. Hospital Admissions in last 6 months = 5  Interventions:  . Evaluation of current treatment plan related to DVT  and patient's adherence to plan as established by provider. Nash Dimmer with PCP regarding Lovonox teaching and dosing . Discussed plans with patient for ongoing care management follow up and provided patient with direct contact information for care management team . Reviewed scheduled/upcoming provider appointments including:  Patient is to come back to see PCP @ 11am on Monday 9/28.  [name] Patient teaching done for Lovonox injections, patient verbalized understanding and showed return demonstration for injections.  . Current INR- 1.5 - recheck to be done at appt on Monday. Patient counseled by PCP on correct dosing of Coumadin through the weekend.   Patient Self Care Activities:  . Currently UNABLE TO independently manage multiple chronic disease processes as evidenced by 5 hospital admits in the last 6 mos  Please see past updates related to this goal by clicking on the  "Past Updates" button in the selected goal  Previous goal of weight loss d/c'd related to patient's new focus         The patient verbalized understanding of instructions provided today and declined a print copy of patient instruction materials.   The patient has been provided with contact information for the care management team and has been advised to call with any health related questions or concerns.

## 2018-11-12 NOTE — Chronic Care Management (AMB) (Signed)
Chronic Care Management   Follow Up Note   11/10/2018 Name: [name] [name] MRN: [id number] DOB: 1960-08-04  Referred by: [name], DO Reason for referral : Chronic Care Management (F/U post hospitalization)   [name] [name] is a 58 y.o. year old male who is a primary care patient of [name], DO. The CCM team was consulted for assistance with chronic disease management and care coordination needs.    Review of patient status, including review of consultants reports, relevant laboratory and other test results, and collaboration with appropriate care team members and the patient's provider was performed as part of comprehensive patient evaluation and provision of chronic care management services.    SDOH (Social Determinants of Health) screening performed today: None. See Care Plan for related entries.   Advanced Directives Status: N See Care Plan and Vynca application for related entries.  Outpatient Encounter Medications as of 11/10/2018  Medication Sig  . albuterol (VENTOLIN HFA) 108 (90 Base) MCG/ACT inhaler Inhale 2 puffs into the lungs every 6 (six) hours as needed for wheezing or shortness of breath.  . allopurinol (ZYLOPRIM) 100 MG tablet Take 1 tablet (100 mg total) by mouth daily.  [name] amiodarone (PACERONE) 200 MG tablet Take 1 tablet (200 mg total) by mouth daily.  [name] atorvastatin (LIPITOR) 40 MG tablet TAKE 1 TABLET BY MOUTH ONCE DAILY (Patient taking differently: Take 40 mg by mouth daily. )  . DULoxetine (CYMBALTA) 20 MG capsule Take 20 mg by mouth daily.  [name] enoxaparin (LOVENOX) 100 MG/ML injection Inject 1.5 mLs (150 mg total) into the skin every 12 (twelve) hours.  . ferrous sulfate 325 (65 FE) MG tablet Take 1 tablet (325 mg total) by mouth 2 (two) times daily with a meal.  . levothyroxine (SYNTHROID) 50 MCG tablet Take 1 tablet (50 mcg total) by mouth daily before breakfast.  . lisinopril (ZESTRIL) 2.5 MG tablet Take 2.5 mg by mouth daily.  . meloxicam  (MOBIC) 15 MG tablet Take 1 tablet (15 mg total) by mouth daily.  . metoprolol succinate (TOPROL-XL) 50 MG 24 hr tablet Take 50 mg by mouth 2 (two) times daily. Take with or immediately following a meal.  . nitroGLYCERIN (NITROSTAT) 0.4 MG SL tablet Place 1 tablet (0.4 mg total) under the tongue every 5 (five) minutes as needed for chest pain.  [name] oxyCODONE 10 MG TABS Take 1 tablet (10 mg total) by mouth every 4 (four) hours as needed for moderate pain. (Patient not taking: Reported on 11/10/2018)  . spironolactone (ALDACTONE) 25 MG tablet Take 1 tablet (25 mg total) by mouth daily.  [name] torsemide (DEMADEX) 20 MG tablet Take 40 mg by mouth daily.  . traZODone (DESYREL) 150 MG tablet TAKE 1 TABLET BY MOUTH AT BEDTIME AS NEEDED SLEEP  . warfarin (COUMADIN) 5 MG tablet Take 1 tablet (5 mg total) by mouth daily.   No facility-administered encounter medications on file as of 11/10/2018.      Goals Addressed            This Visit's Progress   . RN-I need help managing my overall health (pt-stated)       Current Barriers:  . Lacks caregiver support.  . Film/video editor.  . Literacy barriers . Transportation barriers . Difficulty obtaining medications . Cognitive Deficits . Chronic Disease Management support and education needs related to managing multiple chronic diseases as evidenced by multiple hospitalizations in the last 6 months.  Nurse Case Manager Clinical Goal(s):  [name] Over the next  90 days, patient will not experience hospital admission. Hospital Admissions in last 6 months = 5  Interventions:  . Evaluation of current treatment plan related to DVT  and patient's adherence to plan as established by provider. Steele Sizer with PCP regarding Lovonox teaching and dosing . Discussed plans with patient for ongoing care management follow up and provided patient with direct contact information for care management team . Reviewed scheduled/upcoming provider appointments including:   Patient is to come back to see PCP @ 11am on Monday 9/28.  [name] Patient teaching done for Lovonox injections, patient verbalized understanding and showed return demonstration for injections.  . Current INR- 1.5 - recheck to be done at appt on Monday. Patient counseled by PCP on correct dosing of Coumadin through the weekend.   Patient Self Care Activities:  . Currently UNABLE TO independently manage multiple chronic disease processes as evidenced by 5 hospital admits in the last 6 mos  Please see past updates related to this goal by clicking on the "Past Updates" button in the selected goal  Previous goal of weight loss d/c'd related to patient's new focus          The patient has been provided with contact information for the care management team and has been advised to call with any health related questions or concerns.   Ma Rings [name] Ungerer RN, BSN Nurse Case Education officer, [facility]  [phone]) Business Mobile

## 2018-11-13 ENCOUNTER — Encounter: Payer: Self-pay | Admitting: Family Medicine

## 2018-11-13 ENCOUNTER — Ambulatory Visit (INDEPENDENT_AMBULATORY_CARE_PROVIDER_SITE_OTHER): Payer: Medicaid Other | Admitting: Family Medicine

## 2018-11-13 ENCOUNTER — Other Ambulatory Visit: Payer: Self-pay

## 2018-11-13 VITALS — BP 118/67 | HR 57 | Temp 98.8°F | Ht 68.0 in

## 2018-11-13 DIAGNOSIS — R791 Abnormal coagulation profile: Secondary | ICD-10-CM | POA: Diagnosis not present

## 2018-11-13 DIAGNOSIS — I2699 Other pulmonary embolism without acute cor pulmonale: Secondary | ICD-10-CM

## 2018-11-13 DIAGNOSIS — I872 Venous insufficiency (chronic) (peripheral): Secondary | ICD-10-CM | POA: Diagnosis not present

## 2018-11-13 DIAGNOSIS — Z86711 Personal history of pulmonary embolism: Secondary | ICD-10-CM

## 2018-11-13 LAB — COAGUCHEK XS/INR WAIVED
INR: 1.9 — ABNORMAL HIGH (ref 0.9–1.1)
Prothrombin Time: 22.5 s

## 2018-11-13 MED ORDER — WARFARIN SODIUM 5 MG PO TABS: 15.0000 mg | ORAL_TABLET | Freq: Every day | ORAL | 1 refills | Status: DC

## 2018-11-13 MED ORDER — ENOXAPARIN SODIUM 150 MG/ML ~~LOC~~ SOLN: 150.0000 mg | Freq: Two times a day (BID) | SUBCUTANEOUS | 0 refills | Status: DC

## 2018-11-13 NOTE — Progress Notes (Signed)
   BP 118/67   Pulse (!) 57   Temp 98.8 F (37.1 C) (Oral)   Ht 5\' 8"  (1.727 m)   SpO2 97%   BMI 61.58 kg/m    Subjective:    Patient ID: [name] [name], male    DOB: 04-Sep-1960, 57 y.o.   MRN: [id number]  CC: Coumadin management  HPI: This patient is a 58 y.o. male who presents for coumadin management. The expected duration of coumadin treatment is lifelong The reason for anticoagulation is  A. Fib, PE on xarelto. He has not been happy doing his shots. He has been taking his medicine, but states that he does not feel well and is not happy about it.  Present Coumadin dose: 10mg  daily Goal: 2.0-3.0  Excessive bruising: no Nose bleeding: no Rectal bleeding: no Prolonged menstrual cycles: N/A Eating diet with consistent amounts of foods containing Vitamin K:yes Any recent antibiotic use? no  Relevant past medical, surgical, family and social history reviewed and updated as indicated. Interim medical history since our last visit reviewed. Allergies and medications reviewed and updated.  ROS: Per HPI unless specifically indicated above     Objective:    BP 118/67   Pulse (!) 57   Temp 98.8 F (37.1 C) (Oral)   Ht 5\' 8"  (1.727 m)   SpO2 97%   BMI 61.58 kg/m   Wt Readings from Last 3 Encounters:  11/01/18 (!) 405 lb (183.7 kg)  10/28/18 (!) 408 lb 6.4 oz (185.2 kg)  09/12/18 (!) 401 lb (181.9 kg)     General: Well appearing, well nourished in no distress.  Normal mood and affect. Skin: No excessive bruising or rash  Last INR: 1.9 Last PT: 22.5    Last CBC:  Lab Results  Component Value Date   WBC 5.3 10/28/2018   HGB 11.6 (L) 10/28/2018   HCT 35.3 (L) 10/28/2018   MCV 91.0 10/28/2018   PLT 166 10/28/2018    Results for orders placed or performed in visit on 11/10/18  CoaguChek XS/INR Waived  Result Value Ref Range   INR 1.5 (H) 0.9 - 1.1   Prothrombin Time 18.0 sec       Assessment:     ICD-10-CM   1. Other acute pulmonary embolism, unspecified  whether acute cor pulmonale present (HCC)  I26.99 CoaguChek XS/INR Waived  2. Subtherapeutic international normalized ratio (INR)  R79.1 CoaguChek XS/INR Waived  3. History of pulmonary embolism (on Xarelto)  Z86.711 CoaguChek XS/INR Waived    Plan:   Discussed current plan face-to-face with patient. For coumadin dosing, elected to change dose to 15mg , will continue his lovenox right now for at least another 3 days. Will plan to recheck INR in Thursday.

## 2018-11-14 ENCOUNTER — Ambulatory Visit: Payer: Medicaid Other | Admitting: Family Medicine

## 2018-11-16 ENCOUNTER — Ambulatory Visit: Payer: Medicaid Other | Admitting: Urology

## 2018-11-16 ENCOUNTER — Encounter: Payer: Self-pay | Admitting: Urology

## 2018-11-17 ENCOUNTER — Emergency Department: Payer: Medicaid Other

## 2018-11-17 ENCOUNTER — Encounter: Payer: Self-pay | Admitting: Family Medicine

## 2018-11-17 ENCOUNTER — Emergency Department
Admission: EM | Admit: 2018-11-17 | Discharge: 2018-11-17 | Disposition: A | Discharge status: Home / Self Care | Payer: Medicaid Other | Attending: Emergency Medicine | Admitting: Emergency Medicine

## 2018-11-17 ENCOUNTER — Other Ambulatory Visit: Payer: Self-pay

## 2018-11-17 ENCOUNTER — Ambulatory Visit (INDEPENDENT_AMBULATORY_CARE_PROVIDER_SITE_OTHER): Payer: Medicaid Other | Admitting: Family Medicine

## 2018-11-17 VITALS — BP 168/85 | HR 88 | Temp 98.9°F

## 2018-11-17 DIAGNOSIS — R231 Pallor: Secondary | ICD-10-CM | POA: Diagnosis not present

## 2018-11-17 DIAGNOSIS — R079 Chest pain, unspecified: Secondary | ICD-10-CM | POA: Insufficient documentation

## 2018-11-17 DIAGNOSIS — I213 ST elevation (STEMI) myocardial infarction of unspecified site: Secondary | ICD-10-CM | POA: Diagnosis not present

## 2018-11-17 DIAGNOSIS — R791 Abnormal coagulation profile: Secondary | ICD-10-CM

## 2018-11-17 DIAGNOSIS — J449 Chronic obstructive pulmonary disease, unspecified: Secondary | ICD-10-CM | POA: Insufficient documentation

## 2018-11-17 DIAGNOSIS — R519 Headache, unspecified: Secondary | ICD-10-CM | POA: Diagnosis not present

## 2018-11-17 DIAGNOSIS — I10 Essential (primary) hypertension: Secondary | ICD-10-CM | POA: Diagnosis not present

## 2018-11-17 DIAGNOSIS — I110 Hypertensive heart disease with heart failure: Secondary | ICD-10-CM | POA: Insufficient documentation

## 2018-11-17 DIAGNOSIS — I4891 Unspecified atrial fibrillation: Secondary | ICD-10-CM | POA: Diagnosis not present

## 2018-11-17 DIAGNOSIS — I4819 Other persistent atrial fibrillation: Secondary | ICD-10-CM

## 2018-11-17 DIAGNOSIS — Z86718 Personal history of other venous thrombosis and embolism: Secondary | ICD-10-CM | POA: Diagnosis not present

## 2018-11-17 DIAGNOSIS — Z86711 Personal history of pulmonary embolism: Secondary | ICD-10-CM | POA: Insufficient documentation

## 2018-11-17 DIAGNOSIS — I509 Heart failure, unspecified: Secondary | ICD-10-CM | POA: Diagnosis not present

## 2018-11-17 DIAGNOSIS — R001 Bradycardia, unspecified: Secondary | ICD-10-CM | POA: Diagnosis not present

## 2018-11-17 DIAGNOSIS — I2699 Other pulmonary embolism without acute cor pulmonale: Secondary | ICD-10-CM | POA: Diagnosis not present

## 2018-11-17 LAB — CBC WITH DIFFERENTIAL/PLATELET
Abs Immature Granulocytes: 0.03 10*3/uL (ref 0.00–0.07)
Basophils Absolute: 0.1 10*3/uL (ref 0.0–0.1)
Basophils Relative: 1 %
Eosinophils Absolute: 0.1 10*3/uL (ref 0.0–0.5)
Eosinophils Relative: 2 %
HCT: 38.9 % — ABNORMAL LOW (ref 39.0–52.0)
Hematocrit: 37.1 % — ABNORMAL LOW (ref 37.5–51.0)
Hemoglobin: 12.8 g/dL — ABNORMAL LOW (ref 13.0–17.7)
Hemoglobin: 13.1 g/dL (ref 13.0–17.0)
Immature Granulocytes: 1 %
Lymphocytes Absolute: 0.9 10*3/uL (ref 0.7–3.1)
Lymphocytes Relative: 15 %
Lymphs Abs: 1 10*3/uL (ref 0.7–4.0)
Lymphs: 15 %
MCH: 30.6 pg (ref 26.6–33.0)
MCH: 29.9 pg (ref 26.0–34.0)
MCHC: 34.5 g/dL (ref 31.5–35.7)
MCHC: 33.7 g/dL (ref 30.0–36.0)
MCV: 89 fL (ref 79–97)
MCV: 88.8 fL (ref 80.0–100.0)
MID (Absolute): 0.4 10*3/uL (ref 0.1–1.6)
MID: 7 %
Monocytes Absolute: 0.6 10*3/uL (ref 0.1–1.0)
Monocytes Relative: 10 %
Neutro Abs: 4.7 10*3/uL (ref 1.7–7.7)
Neutrophils Absolute: 4.7 10*3/uL (ref 1.4–7.0)
Neutrophils Relative %: 71 %
Neutrophils: 78 %
Platelets: 265 10*3/uL (ref 150–450)
Platelets: 248 10*3/uL (ref 150–400)
RBC: 4.18 x10E6/uL (ref 4.14–5.80)
RBC: 4.38 MIL/uL (ref 4.22–5.81)
RDW: 15.7 % — ABNORMAL HIGH (ref 11.6–15.4)
RDW: 14.8 % (ref 11.5–15.5)
WBC: 6 10*3/uL (ref 3.4–10.8)
WBC: 6.4 10*3/uL (ref 4.0–10.5)
nRBC: 0 % (ref 0.0–0.2)

## 2018-11-17 LAB — BLOOD GAS, VENOUS
Acid-Base Excess: 2.5 mmol/L — ABNORMAL HIGH (ref 0.0–2.0)
Bicarbonate: 28.4 mmol/L — ABNORMAL HIGH (ref 20.0–28.0)
O2 Saturation: 33.3 %
Patient temperature: 37
pCO2, Ven: 48 mmHg (ref 44.0–60.0)
pH, Ven: 7.38 (ref 7.250–7.430)
pO2, Ven: <31 mmHg — CL (ref 32.0–45.0)

## 2018-11-17 LAB — TROPONIN I (HIGH SENSITIVITY)
Troponin I (High Sensitivity): 18 ng/L — ABNORMAL HIGH (ref <18)
Troponin I (High Sensitivity): 20 ng/L — ABNORMAL HIGH (ref <18)

## 2018-11-17 LAB — COMPREHENSIVE METABOLIC PANEL
ALT: 23 U/L (ref 0–44)
AST: 26 U/L (ref 15–41)
Albumin: 4.1 g/dL (ref 3.5–5.0)
Alkaline Phosphatase: 95 U/L (ref 38–126)
Anion gap: 11 (ref 5–15)
BUN: 16 mg/dL (ref 6–20)
CO2: 25 mmol/L (ref 22–32)
Calcium: 9.2 mg/dL (ref 8.9–10.3)
Chloride: 98 mmol/L (ref 98–111)
Creatinine, Ser: 1.22 mg/dL (ref 0.61–1.24)
GFR calc Af Amer: >60 mL/min (ref >60)
GFR calc non Af Amer: >60 mL/min (ref >60)
Glucose, Bld: 110 mg/dL — ABNORMAL HIGH (ref 70–99)
Potassium: 4.1 mmol/L (ref 3.5–5.1)
Sodium: 134 mmol/L — ABNORMAL LOW (ref 135–145)
Total Bilirubin: 0.4 mg/dL (ref 0.3–1.2)
Total Protein: 8.3 g/dL — ABNORMAL HIGH (ref 6.5–8.1)

## 2018-11-17 LAB — PROTIME-INR
INR: 4.8 (ref 0.8–1.2)
Prothrombin Time: 44.4 seconds — ABNORMAL HIGH (ref 11.4–15.2)

## 2018-11-17 LAB — COAGUCHEK XS/INR WAIVED
INR: 7.2 (ref 0.9–1.1)
Prothrombin Time: 85.8 s

## 2018-11-17 MED ORDER — METOCLOPRAMIDE HCL 5 MG/ML IJ SOLN
10.0000 mg | Freq: Once | INTRAMUSCULAR | Status: AC
  Administered 2018-11-17: 16:00:00 10 mg via INTRAVENOUS
  Filled 2018-11-17: qty 2

## 2018-11-17 MED ORDER — OXYCODONE-ACETAMINOPHEN 5-325 MG PO TABS: 1.0000 | ORAL_TABLET | Freq: Two times a day (BID) | ORAL | 0 refills | Status: DC | PRN

## 2018-11-17 MED ORDER — KETOROLAC TROMETHAMINE 30 MG/ML IJ SOLN
30.0000 mg | Freq: Once | INTRAMUSCULAR | Status: AC
  Administered 2018-11-17: 16:00:00 30 mg via INTRAVENOUS
  Filled 2018-11-17: qty 1

## 2018-11-17 NOTE — ED Notes (Signed)
Dr. Jimmye Norman informed of critical INR

## 2018-11-17 NOTE — ED Provider Notes (Signed)
[facility] Emergency Department Provider Note       Time seen: ----------------------------------------- 4:55 PM on 11/17/2018 -----------------------------------------   I have reviewed the triage vital signs and the nursing notes.  HISTORY   Chief Complaint Chest Pain    HPI [name] [name] is a 58 y.o. male with a history of asthma, brain damage, COPD, depression, heart failure, hypertension, neck pain, atrial fibrillation, pulmonary emboli who presents to the ED for chest pain also headache and neck pain.  Patient describes head and neck pain down the center of his neck.  Head and neck pain began before the chest pain.  Currently he has been placed on Coumadin for known PE.  He was given fentanyl and Zofran in route by EMS.  Past Medical History:  Diagnosis Date  . Asthma   . Brain damage   . Chronic pain of both knees 07/13/2018  . COPD (chronic obstructive pulmonary disease) (HCC)   . Depression   . HFrEF (heart failure with reduced ejection fraction) (HCC)    a. 03/2018 Echo: EF 25-30%, diff HK. Mod dil LA.  [name] Hypertension   . Neck pain 07/13/2018  . Persistent atrial fibrillation (HCC)    a. 03/2018 s/p DCCV; b. CHA2DS2VASc = 1-->Xarelto; c. 05/2018 recurrent afib-->Amio initiated;   . Sleep apnea     Patient Active Problem List   Diagnosis Date Noted  . Subtherapeutic international normalized ratio (INR) 11/11/2018  . Acute pulmonary embolism (HCC) 11/01/2018  . Depression, major, single episode, moderate (HCC) 09/17/2018  . Acute on chronic systolic CHF (congestive heart failure) (HCC) 07/18/2018  . Chronic atrial fibrillation (HCC)   . Personal history of DVT (deep vein thrombosis) 07/13/2018  . History of pulmonary embolism (on Xarelto) 07/13/2018  . Chronic bilateral low back pain with bilateral sciatica 07/13/2018  . TBI (traumatic brain injury) (HCC) 07/04/2018  . Anemia 06/27/2018  . Abnormal thyroid blood test 06/27/2018  .  Diet-controlled diabetes mellitus (HCC) 06/06/2018  . HTN (hypertension) 06/06/2018  . Gout 06/06/2018  . Morbid obesity (HCC) 06/06/2018  . Chronic pain 06/06/2018  . Ulcers of both lower extremities, limited to breakdown of skin (HCC) 06/06/2018  . Persistent atrial fibrillation (HCC)   . Chronic systolic heart failure (HCC) 02/02/2018  . COPD (chronic obstructive pulmonary disease) (HCC) 02/02/2018  . Obstructive sleep apnea 02/02/2018  . Acute respiratory failure with hypoxia (HCC) 01/27/2018    Past Surgical History:  Procedure Laterality Date  . CARDIOVERSION N/A 03/24/2018   Procedure: CARDIOVERSION;  Surgeon: Antonieta Iba, MD;  Location: [facility] ORS;  Service: Cardiovascular;  Laterality: N/A;  . CARDIOVERSION N/A 08/08/2018   Procedure: CARDIOVERSION;  Surgeon: [name], MD;  Location: [facility] ORS;  Service: Cardiovascular;  Laterality: N/A;  . hearnia repair     X 3- total of two surgeries  . LEG SURGERY      Allergies Patient has no known allergies.  Social History Social History   Tobacco Use  . Smoking status: Never Smoker  . Smokeless tobacco: Never Used  Substance Use Topics  . Alcohol use: Never    Frequency: Never  . Drug use: Never   Review of Systems Constitutional: Negative for fever. Cardiovascular: Positive for chest pain. Respiratory: Negative for shortness of breath. Gastrointestinal: Negative for abdominal pain, vomiting and diarrhea. Musculoskeletal: Positive for neck pain Skin: Negative for rash. Neurological: positive for headache  All systems negative/normal/unremarkable except as stated in the HPI  ____________________________________________   PHYSICAL EXAM:  VITAL SIGNS:  ED Triage Vitals  Enc Vitals Group     BP 11/17/18 1521 (!) 151/69     Pulse Rate 11/17/18 1521 80     Resp 11/17/18 1521 18     Temp 11/17/18 1521 99 F (37.2 C)     Temp Source 11/17/18 1521 Oral     SpO2 11/17/18 1520 98 %     Weight 11/17/18 1522  (!) 400 lb (181.4 kg)     Height 11/17/18 1522 5\' 8"  (1.727 m)     Head Circumference --      Peak Flow --      Pain Score 11/17/18 1522 8     Pain Loc --      Pain Edu? --      Excl. in [place]? --    Constitutional: Alert and oriented.  Morbidly obese, no distress Eyes: Conjunctivae are normal. Normal extraocular movements. ENT      Head: Normocephalic and atraumatic.      Nose: No congestion/rhinnorhea.      Mouth/Throat: Mucous membranes are moist.      Neck: No stridor. Cardiovascular: Normal rate, regular rhythm. No murmurs, rubs, or gallops. Respiratory: Normal respiratory effort without tachypnea nor retractions. Breath sounds are clear and equal bilaterally. No wheezes/rales/rhonchi. Gastrointestinal: Soft and nontender. Normal bowel sounds Musculoskeletal: Nontender with normal range of motion in extremities. No lower extremity tenderness nor edema. Neurologic:  Normal speech and language. No gross focal neurologic deficits are appreciated.  Skin:  Skin is warm, dry and intact.  Unna boot dressings are in place Psychiatric: Mood and affect are normal. Speech and behavior are normal.  ____________________________________________  EKG: Interpreted by me.  Sinus rhythm the rate of 81 bpm, left bundle branch block, normal axis, long QT  ____________________________________________  ED COURSE:  As part of my medical decision making, I reviewed the following data within the [place] History obtained from family if available, nursing notes, old chart and ekg, as well as notes from prior ED visits. Patient presented for headache that radiates into his neck, also chest pain, we will assess with labs and imaging as indicated at this time. Clinical Course as of Nov 16 1849  Fri Nov 17, 2018  1704 Patient reports improved headache, declines further workup (MRI/MRA/LP) for headache   [JW]    Clinical Course User Index [JW] Earleen Newport, MD    Procedures  [name] [name] was evaluated in Emergency Department on 11/17/2018 for the symptoms described in the history of present illness. He was evaluated in the context of the global COVID-19 pandemic, which necessitated consideration that the patient might be at risk for infection with the SARS-CoV-2 virus that causes COVID-19. Institutional protocols and algorithms that pertain to the evaluation of patients at risk for COVID-19 are in a state of rapid change based on information released by regulatory bodies including the CDC and federal and state organizations. These policies and algorithms were followed during the patient's care in the ED.  ____________________________________________   LABS (pertinent positives/negatives)  Labs Reviewed  CBC WITH DIFFERENTIAL/PLATELET - Abnormal; Notable for the following components:      Result Value   HCT 38.9 (*)    All other components within normal limits  COMPREHENSIVE METABOLIC PANEL - Abnormal; Notable for the following components:   Sodium 134 (*)    Glucose, Bld 110 (*)    Total Protein 8.3 (*)    All other components within normal limits  PROTIME-INR - Abnormal; Notable for  the following components:   Prothrombin Time 44.4 (*)    INR 4.8 (*)    All other components within normal limits  BLOOD GAS, VENOUS - Abnormal; Notable for the following components:   pO2, Ven <31.0 (*)    Bicarbonate 28.4 (*)    Acid-Base Excess 2.5 (*)    All other components within normal limits  TROPONIN I (HIGH SENSITIVITY) - Abnormal; Notable for the following components:   Troponin I (High Sensitivity) 18 (*)    All other components within normal limits  TROPONIN I (HIGH SENSITIVITY) - Abnormal; Notable for the following components:   Troponin I (High Sensitivity) 20 (*)    All other components within normal limits    RADIOLOGY Images were viewed by me  IMPRESSION: No acute intracranial pathology. Mild small-vessel white  matter disease. ____________________________________________   DIFFERENTIAL DIAGNOSIS   Unstable angina, MI, subarachnoid hemorrhage, tension headache, migraine  FINAL ASSESSMENT AND PLAN  Chest pain, headache   Plan: The patient had presented for chest pain as well as headache and neck pain. Patient's labs did not reveal any acute process, troponin appears to be stable. Patient's imaging not reveal any acute process.  As dictated above he did not want any further treatment for his headache and it appeared to resolve.  He is cleared for close outpatient follow-up.   Ulice Dash, MD    Note: This note was generated in part or whole with voice recognition software. Voice recognition is usually quite accurate but there are transcription errors that can and very often do occur. I apologize for any typographical errors that were not detected and corrected.     Emily Filbert, MD 11/17/18 [phone]

## 2018-11-17 NOTE — Progress Notes (Signed)
BP (!) 168/85   Pulse 88   Temp 98.9 F (37.2 C)   SpO2 97%    Subjective:    Patient ID: [name] [name], male    DOB: [birth year], 58 y.o.   MRN: [id number]  HPI: [name] [name] is a 58 y.o. male  Chief Complaint  Patient presents with  . Pulmonary Embolism  . Chest Pain   HPI: This patient is a 58 y.o. male who presents for coumadin management. The expected duration of coumadin treatment is lifelong The reason for anticoagulation is  A. Fib and Acute Pulmonary Embolism  Present Coumadin dose: 15mg  daily and currently on BID lovenox Goal: 2.0-3.0  Excessive bruising: no Nose bleeding: no Rectal bleeding: yes Prolonged menstrual cycles: N/A Eating diet with consistent amounts of foods containing Vitamin K:yes Any recent antibiotic use? no  CHEST PAIN- Harim notes that he started not feeling well today. He has been nauseous and having chest pain. He has also been having sweats. He did not make his appointment yesterday to have his INR checked because he noted that he was having issues with his car. He came in today and notes that he had a big bruise on his R side and that he noticed a knot there.  Duration: Today- not feeling well. Onset: sudden Quality: sharp Severity: severe Location: left para substernal Radiation: none Episode duration:  Frequency: constant Related to exertion: no Activity when pain started:  Trauma: no Anxiety/recent stressors: yes Aggravating factors:  Alleviating factors:  Status: worse Treatments attempted: nothing  Current pain status: in pain Shortness of breath: yes Cough: no Nausea: yes Diaphoresis: yes Heartburn: no Palpitations: no   Relevant past medical, surgical, family and social history reviewed and updated as indicated. Interim medical history since our last visit reviewed. Allergies and medications reviewed and updated.  Review of Systems  Constitutional: Positive for diaphoresis and fatigue. Negative for activity  change, appetite change, chills, fever and unexpected weight change.  HENT: Negative.   Respiratory: Positive for chest tightness and shortness of breath. Negative for apnea, cough, choking, wheezing and stridor.   Cardiovascular: Positive for chest pain and leg swelling. Negative for palpitations.  Gastrointestinal: Positive for nausea. Negative for abdominal distention, abdominal pain, anal bleeding, blood in stool, constipation, diarrhea, rectal pain and vomiting.  Musculoskeletal: Negative.   Allergic/Immunologic: Negative.   Neurological: Positive for dizziness, weakness, light-headedness and headaches. Negative for tremors, seizures, syncope, facial asymmetry, speech difficulty and numbness.  Psychiatric/Behavioral: Negative.     Per HPI unless specifically indicated above     Objective:    BP (!) 168/85   Pulse 88   Temp 98.9 F (37.2 C)   SpO2 97%   Wt Readings from Last 3 Encounters:  11/17/18 (!) 400 lb (181.4 kg)  11/01/18 (!) 405 lb (183.7 kg)  10/28/18 (!) 408 lb 6.4 oz (185.2 kg)    Physical Exam Vitals signs and nursing note reviewed.  Constitutional:      General: He is not in acute distress.    Appearance: Normal appearance. He is well-developed. He is obese. He is ill-appearing and diaphoretic. He is not toxic-appearing.  HENT:     Head: Normocephalic and atraumatic.     Right Ear: External ear normal.     Left Ear: External ear normal.     Nose: Nose normal.     Mouth/Throat:     Mouth: Mucous membranes are moist.     Pharynx: Oropharynx is clear.  Eyes:  General: No scleral icterus.       Right eye: No discharge.        Left eye: No discharge.     Extraocular Movements: Extraocular movements intact.     Conjunctiva/sclera: Conjunctivae normal.     Pupils: Pupils are equal, round, and reactive to light.  Neck:     Musculoskeletal: Normal range of motion and neck supple.  Cardiovascular:     Rate and Rhythm: Normal rate and regular rhythm.      Pulses: Normal pulses.     Heart sounds: Normal heart sounds. No murmur. No friction rub. No gallop.   Pulmonary:     Effort: Pulmonary effort is normal. No respiratory distress.     Breath sounds: No stridor. Examination of the right-upper field reveals decreased breath sounds. Examination of the left-upper field reveals decreased breath sounds. Examination of the right-middle field reveals decreased breath sounds. Examination of the left-middle field reveals decreased breath sounds. Examination of the right-lower field reveals decreased breath sounds. Examination of the left-lower field reveals decreased breath sounds. Decreased breath sounds present. No wheezing, rhonchi or rales.  Chest:     Chest wall: No tenderness.  Musculoskeletal: Normal range of motion.     Right lower leg: Edema present.     Left lower leg: Edema present.  Skin:    General: Skin is warm.     Capillary Refill: Capillary refill takes less than 2 seconds.     Coloration: Skin is not jaundiced or pale.     Findings: No bruising, erythema, lesion or rash.  Neurological:     General: No focal deficit present.     Mental Status: He is alert and oriented to person, place, and time. Mental status is at baseline.  Psychiatric:        Mood and Affect: Mood normal.        Behavior: Behavior normal.        Thought Content: Thought content normal.        Judgment: Judgment normal.     Results for orders placed or performed in visit on 11/13/18  CoaguChek XS/INR Waived  Result Value Ref Range   INR 1.9 (H) 0.9 - 1.1   Prothrombin Time 22.5 sec      Assessment & Plan:   Problem List Items Addressed This Visit      Cardiovascular and Mediastinum   Persistent atrial fibrillation ([place])    Was on xarelto and had a PE about 2 weeks ago, missed medication when discharged from the hospital and was subtherapeutic for about a week, started on lovenox to bridge and has been titrating up on coumadin, last INR 1.9 on Monday-  missed his appointment for INR yesterday, today supratherapeutic at 7.2 with chest pain. Would stop lovenox, hold coumadin for 3 days and recheck Monday/Tuesday- but given chest pain and other symptoms, patient to go to ER for evaluation. Went by rescue today.      Acute pulmonary embolism (HCC)    Was on xarelto and had a PE about 2 weeks ago, missed medication when discharged from the hospital and was subtherapeutic for about a week, started on lovenox to bridge and has been titrating up on coumadin, last INR 1.9 on Monday- missed his appointment for INR yesterday, today supratherapeutic at 7.2 with chest pain. Would stop lovenox, hold coumadin for 3 days and recheck Monday/Tuesday- but given chest pain and other symptoms, patient to go to ER for evaluation. Went by rescue today.  Relevant Orders   CoaguChek XS/INR Waived    Other Visit Diagnoses    Chest pain, unspecified type    -  Primary   Abnormal EKG with diaphroresis and nausea- will call rescue and get him to the hospital. Follow up after ER evaluation.    Relevant Orders   EKG 12-Lead (Completed)   CBC With Differential/Platelet   Comprehensive metabolic panel   Supratherapeutic INR       INR 7.2 today- to go to hospital for chest pain. Has been having a lot of issues getting his INR therapeutic since missing medicine on discharge.        Follow up plan: Return After ER evaluation.   >50 minutes spent with patient in counseling and coordination of care

## 2018-11-17 NOTE — Assessment & Plan Note (Signed)
Was on xarelto and had a PE about 2 weeks ago, missed medication when discharged from the hospital and was subtherapeutic for about a week, started on lovenox to bridge and has been titrating up on coumadin, last INR 1.9 on Monday- missed his appointment for INR yesterday, today supratherapeutic at 7.2 with chest pain. Would stop lovenox, hold coumadin for 3 days and recheck Monday/Tuesday- but given chest pain and other symptoms, patient to go to ER for evaluation. Went by rescue today. 

## 2018-11-17 NOTE — Assessment & Plan Note (Signed)
Was on xarelto and had a PE about 2 weeks ago, missed medication when discharged from the hospital and was subtherapeutic for about a week, started on lovenox to bridge and has been titrating up on coumadin, last INR 1.9 on Monday- missed his appointment for INR yesterday, today supratherapeutic at 7.2 with chest pain. Would stop lovenox, hold coumadin for 3 days and recheck Monday/Tuesday- but given chest pain and other symptoms, patient to go to ER for evaluation. Went by rescue today.

## 2018-11-17 NOTE — ED Triage Notes (Addendum)
Pt arrives via EMS from doctors office for c/o CP and left bundle block on EKG, this is unchanged from EKG done 11/01/18 per EMS. Reports headache and neck pain "down the center of neck". Reports hx head and neck pain before CP began, hx MI. A&Ox4, NAD. EMS gave 74mg  fentanyl IM, 4mg  zofran IM, 324 ASA and 2 nitro.

## 2018-11-17 NOTE — Progress Notes (Deleted)
   BP (!) 168/85   Pulse 88   Temp 98.9 F (37.2 C)   SpO2 97%    Subjective:    Patient ID: [name] [name], male    DOB: February 21, 1960, 58 y.o.   MRN: [id number]  CC: Coumadin management  HPI: This patient is a 58 y.o. male who presents for coumadin management. The expected duration of coumadin treatment is lifelong The reason for anticoagulation is  A. Fib and Acute Pulmonary Embolism  Present Coumadin dose: 15mg  daily and currently on BID lovenox Goal: 2.0-3.0  Excessive bruising: no Nose bleeding: no Rectal bleeding: yes Prolonged menstrual cycles: N/A Eating diet with consistent amounts of foods containing Vitamin K:yes Any recent antibiotic use? no  Relevant past medical, surgical, family and social history reviewed and updated as indicated. Interim medical history since our last visit reviewed. Allergies and medications reviewed and updated.  ROS: Per HPI unless specifically indicated above     Objective:    BP (!) 168/85   Pulse 88   Temp 98.9 F (37.2 C)   SpO2 97%   Wt Readings from Last 3 Encounters:  11/01/18 (!) 405 lb (183.7 kg)  10/28/18 (!) 408 lb 6.4 oz (185.2 kg)  09/12/18 (!) 401 lb (181.9 kg)     General: Well appearing, well nourished in no distress.  Normal mood and affect. Skin: No excessive bruising or rash  Last INR:     Last CBC:  Lab Results  Component Value Date   WBC 5.3 10/28/2018   HGB 11.6 (L) 10/28/2018   HCT 35.3 (L) 10/28/2018   MCV 91.0 10/28/2018   PLT 166 10/28/2018    Results for orders placed or performed in visit on 11/13/18  CoaguChek XS/INR Waived  Result Value Ref Range   INR 1.9 (H) 0.9 - 1.1   Prothrombin Time 22.5 sec       Assessment:     ICD-10-CM   1. Persistent atrial fibrillation (HCC)  I48.19   2. Chronic atrial fibrillation (HCC)  I48.20   3. Other acute pulmonary embolism, unspecified whether acute cor pulmonale present ([place])  I26.99 CoaguChek XS/INR Waived    Plan:   Discussed current plan  face-to-face with patient. For coumadin dosing, elected to {Blank single:19197::"continue current dose","change dose to","hold dose"}. Will plan to recheck INR in {Blank single:19197::"1 month","1 week","2 weeks"}.

## 2018-11-18 LAB — COMPREHENSIVE METABOLIC PANEL
ALT: 21 IU/L (ref 0–44)
AST: 23 IU/L (ref 0–40)
Albumin/Globulin Ratio: 1.3 (ref 1.2–2.2)
Albumin: 3.9 g/dL (ref 3.8–4.9)
Alkaline Phosphatase: 106 IU/L (ref 39–117)
BUN/Creatinine Ratio: 13 (ref 9–20)
BUN: 15 mg/dL (ref 6–24)
Bilirubin Total: <0.2 mg/dL (ref 0.0–1.2)
CO2: 21 mmol/L (ref 20–29)
Calcium: 9 mg/dL (ref 8.7–10.2)
Chloride: 97 mmol/L (ref 96–106)
Creatinine, Ser: 1.18 mg/dL (ref 0.76–1.27)
GFR calc Af Amer: 78 mL/min/{1.73_m2} (ref >59)
GFR calc non Af Amer: 68 mL/min/{1.73_m2} (ref >59)
Globulin, Total: 3 g/dL (ref 1.5–4.5)
Glucose: 106 mg/dL — ABNORMAL HIGH (ref 65–99)
Potassium: 4.1 mmol/L (ref 3.5–5.2)
Sodium: 135 mmol/L (ref 134–144)
Total Protein: 6.9 g/dL (ref 6.0–8.5)

## 2018-11-20 ENCOUNTER — Telehealth: Payer: Self-pay | Admitting: Family Medicine

## 2018-11-20 NOTE — Telephone Encounter (Signed)
LVM for patient to return phone call.  

## 2018-11-20 NOTE — Telephone Encounter (Signed)
Please let him know that his labs came back normal and please make sure that he has a follow up appointment for his INR THIS WEEK after his hospitalization. Thanks.

## 2018-11-21 DIAGNOSIS — I872 Venous insufficiency (chronic) (peripheral): Secondary | ICD-10-CM | POA: Diagnosis not present

## 2018-11-21 NOTE — Telephone Encounter (Signed)
Called patient. Left VM to return call to the office.

## 2018-11-22 NOTE — Telephone Encounter (Signed)
Appt scheduled for Friday

## 2018-11-22 NOTE — Telephone Encounter (Signed)
He needs an appointment and I'm not sure he'll be able to do a virtual visit. In office would be preferable- but needs at least a virtual visit.

## 2018-11-22 NOTE — Telephone Encounter (Signed)
Called and spoke with patient, he states that [facility] will come tomorrow and check it, does he still need to come in for an office visit?

## 2018-11-23 DIAGNOSIS — I872 Venous insufficiency (chronic) (peripheral): Secondary | ICD-10-CM | POA: Diagnosis not present

## 2018-11-24 ENCOUNTER — Ambulatory Visit: Payer: Self-pay | Admitting: Family Medicine

## 2018-11-24 ENCOUNTER — Ambulatory Visit: Payer: Self-pay | Admitting: Pharmacist

## 2018-11-24 ENCOUNTER — Telehealth: Payer: Self-pay | Admitting: Family Medicine

## 2018-11-24 NOTE — Telephone Encounter (Signed)
[name] PharmD spoke to [name] [name] today and notes that he could not come in because his car battery was dead.   Per Catie: "He refuses to come to the appointment this afternoon d/t his battery being dead. Refuses me calling Jill Alexanders to see if any transportation assistance. Notes he's thinking about finding care elsewhere because of the answering service, and that you are "too good at her job", Dr. Lenna Sciara. Is tired of being harassed, though admits he knows that we are just doing our jobs. Notes he just wants to live his life the way he wants to live it, and if he dies, that's fine. Also noted he wants to "pull a Charise Killian" sometimes on people in general. I asked if he wanted me to stop calling, and he said yes. So, I'm pulling back. I don't know if offering Palliative is something he would accept, or possibly a connection to some sort of [employer] situation"   Can we please reach out to home health and see if he had an INR done this week- so that I can adjust his medication? Given the situation, I'll be happy to see him today via phone as long as he's had an INR within the past couple of days.  Thanks.

## 2018-11-24 NOTE — Telephone Encounter (Signed)
Patient called me back, left voice mail to let the office know: "Please call [facility] and ask for Lattie Haw" (his [facility] RN) regarding approval of home INR sticks. Per him per Lattie Haw, she has called and faxed this request but has not heard anything back. He notes that she comes on Monday/Thurs to his home. Notes that she had a difficult time getting through to the clinical staff when she was trying to call in INR results last week.   Thanks!

## 2018-11-24 NOTE — Chronic Care Management (AMB) (Signed)
  Chronic Care Management   Note  11/24/2018 Name: [name] [name] MRN: [id number] DOB: [birth year]  [name] [name] is a 58 y.o. year old male who is a primary care patient of [name], DO. The CCM team was consulted for assistance with chronic disease management and care coordination needs.    Contacted patient for medication management follow up. He acknowledges the scheduled appointment with Dr. Wynetta Emery later today, but notes that he won't be able to make it because he thinks his car battery is dead. Declines me calling Care Guide Jill Alexanders to investigate any transportation assistance. Notes that he is considering seeking primary care elsewhere because of the [facility] answering service, frequent follow up recommendations, and "being harassed". Asked if he would like me to cease calling, he agreed to this. I encouraged him to contact me or the office if he changes his mind or has future needs.   Will communicate the above with primary care provider and CCM team.   [name], PharmD Clinical Pharmacist [place] [phone]

## 2018-11-24 NOTE — Telephone Encounter (Signed)
Called [facility] and spoke with Crowder. Verbal orders given to check patients INR on Monday 11/27/18. Also, verbal orders given for a social worker to work with patient, per Dr. Wynetta Emery. Cindy verbalized understanding.

## 2018-11-27 ENCOUNTER — Telehealth: Payer: Self-pay | Admitting: *Deleted

## 2018-11-27 DIAGNOSIS — I872 Venous insufficiency (chronic) (peripheral): Secondary | ICD-10-CM | POA: Diagnosis not present

## 2018-11-27 NOTE — Telephone Encounter (Signed)
Lattie Haw from [facility] calling with an INR of 3.1.  Notified [facility] of lab.

## 2018-11-27 NOTE — Telephone Encounter (Signed)
Appointment tomorrow

## 2018-11-28 ENCOUNTER — Encounter: Payer: Self-pay | Admitting: Family Medicine

## 2018-11-28 ENCOUNTER — Other Ambulatory Visit: Payer: Self-pay

## 2018-11-28 ENCOUNTER — Ambulatory Visit (INDEPENDENT_AMBULATORY_CARE_PROVIDER_SITE_OTHER): Payer: Medicaid Other | Admitting: Family Medicine

## 2018-11-28 ENCOUNTER — Telehealth: Payer: Self-pay

## 2018-11-28 DIAGNOSIS — I482 Chronic atrial fibrillation, unspecified: Secondary | ICD-10-CM

## 2018-11-28 DIAGNOSIS — K625 Hemorrhage of anus and rectum: Secondary | ICD-10-CM | POA: Diagnosis not present

## 2018-11-28 DIAGNOSIS — D6869 Other thrombophilia: Secondary | ICD-10-CM | POA: Insufficient documentation

## 2018-11-28 DIAGNOSIS — I2699 Other pulmonary embolism without acute cor pulmonale: Secondary | ICD-10-CM

## 2018-11-28 MED ORDER — HYDROCORTISONE ACETATE 25 MG RE SUPP: 25.0000 mg | Freq: Two times a day (BID) | RECTAL | 12 refills | Status: DC

## 2018-11-28 NOTE — Progress Notes (Signed)
There were no vitals taken for this visit.   Subjective:    Patient ID: [name] [name], male    DOB: Jun 15, 1960, 58 y.o.   MRN: [id number]  CC: Coumadin management  HPI: This patient is a 58 y.o. male who presents for coumadin management. The expected duration of coumadin treatment is lifelong The reason for anticoagulation is  Pulmonary embolism and A fib.  Present Coumadin dose: 15mg  daily Goal: 2.0-3.0  Excessive bruising: no Nose bleeding: no Rectal bleeding: yes Prolonged menstrual cycles: N/A Eating diet with consistent amounts of foods containing Vitamin K:no Any recent antibiotic use? no  Relevant past medical, surgical, family and social history reviewed and updated as indicated. Interim medical history since our last visit reviewed. Allergies and medications reviewed and updated.  ROS: Per HPI unless specifically indicated above     Objective:    There were no vitals taken for this visit.  Wt Readings from Last 3 Encounters:  11/17/18 (!) 400 lb (181.4 kg)  11/01/18 (!) 405 lb (183.7 kg)  10/28/18 (!) 408 lb 6.4 oz (185.2 kg)     General: Well appearing, well nourished in no distress.  Normal mood and affect. Skin: No excessive bruising or rash  Last INR: 3.1    Last CBC:  Lab Results  Component Value Date   WBC 6.4 11/17/2018   HGB 13.1 11/17/2018   HCT 38.9 (L) 11/17/2018   MCV 88.8 11/17/2018   PLT 248 11/17/2018    Results for orders placed or performed during the hospital encounter of 11/17/18  CBC with Differential/Platelet  Result Value Ref Range   WBC 6.4 4.0 - 10.5 K/uL   RBC 4.38 4.22 - 5.81 MIL/uL   Hemoglobin 13.1 13.0 - 17.0 g/dL   HCT 38.9 (L) 39.0 - 52.0 %   MCV 88.8 80.0 - 100.0 fL   MCH 29.9 26.0 - 34.0 pg   MCHC 33.7 30.0 - 36.0 g/dL   RDW 14.8 11.5 - 15.5 %   Platelets 248 150 - 400 K/uL   nRBC 0.0 0.0 - 0.2 %   Neutrophils Relative % 71 %   Neutro Abs 4.7 1.7 - 7.7 K/uL   Lymphocytes Relative 15 %   Lymphs Abs 1.0 0.7  - 4.0 K/uL   Monocytes Relative 10 %   Monocytes Absolute 0.6 0.1 - 1.0 K/uL   Eosinophils Relative 2 %   Eosinophils Absolute 0.1 0.0 - 0.5 K/uL   Basophils Relative 1 %   Basophils Absolute 0.1 0.0 - 0.1 K/uL   Immature Granulocytes 1 %   Abs Immature Granulocytes 0.03 0.00 - 0.07 K/uL  Comprehensive metabolic panel  Result Value Ref Range   Sodium 134 (L) 135 - 145 mmol/L   Potassium 4.1 3.5 - 5.1 mmol/L   Chloride 98 98 - 111 mmol/L   CO2 25 22 - 32 mmol/L   Glucose, Bld 110 (H) 70 - 99 mg/dL   BUN 16 6 - 20 mg/dL   Creatinine, Ser 1.22 0.61 - 1.24 mg/dL   Calcium 9.2 8.9 - 10.3 mg/dL   Total Protein 8.3 (H) 6.5 - 8.1 g/dL   Albumin 4.1 3.5 - 5.0 g/dL   AST 26 15 - 41 U/L   ALT 23 0 - 44 U/L   Alkaline Phosphatase 95 38 - 126 U/L   Total Bilirubin 0.4 0.3 - 1.2 mg/dL   GFR calc non Af Amer >60 >60 mL/min   GFR calc Af Amer >60 >60 mL/min  Anion gap 11 5 - 15  Protime-INR  Result Value Ref Range   Prothrombin Time 44.4 (H) 11.4 - 15.2 seconds   INR 4.8 (HH) 0.8 - 1.2  Blood gas, venous  Result Value Ref Range   pH, Ven 7.38 7.250 - 7.430   pCO2, Ven 48 44.0 - 60.0 mmHg   pO2, Ven <31.0 (LL) 32.0 - 45.0 mmHg   Bicarbonate 28.4 (H) 20.0 - 28.0 mmol/L   Acid-Base Excess 2.5 (H) 0.0 - 2.0 mmol/L   O2 Saturation 33.3 %   Patient temperature 37.0    Collection site VEIN    Sample type VEIN   Troponin I (High Sensitivity)  Result Value Ref Range   Troponin I (High Sensitivity) 18 (H) <18 ng/L  Troponin I (High Sensitivity)  Result Value Ref Range   Troponin I (High Sensitivity) 20 (H) <18 ng/L       Assessment:   Problem List Items Addressed This Visit      Cardiovascular and Mediastinum   Chronic atrial fibrillation (HCC)    Refused to come in for visit. INR done by home health yesterday was 3.1- will continue on 15mg  coumadin. Not on lovenox any more. Recheck 1 week. Call with any concerns.       Acute pulmonary embolism (HCC) - Primary    Refused to come  in for visit. INR done by home health yesterday was 3.1- will continue on 15mg  coumadin. Not on lovenox any more. Recheck 1 week. Call with any concerns.        Other Visit Diagnoses    Rectal bleeding       Insists it's hemorrhoids. Will treat with suppositories. If not getting better, let know and we'll get him in for ?banding. Call with any concerns.       Plan:   Discussed current plan face-to-face with patient. For coumadin dosing, elected to continue current dose. Will plan to recheck INR in 1 week.   . This visit was completed via telephone due to the restrictions of the COVID-19 pandemic. All issues as above were discussed and addressed but no physical exam was performed. If it was felt that the patient should be evaluated in the office, they were directed there. The patient verbally consented to this visit. Patient was unable to complete an audio/visual visit due to Lack of equipment. Due to the catastrophic nature of the COVID-19 pandemic, this visit was done through audio contact only. . Location of the patient: home . Location of the provider: work . Those involved with this call:  . Provider: , DO . CMA: Tiffany Reel, CMA . Front Desk/Registration: [place]  . Time spent on call: 21 minutes on the phone discussing health concerns. 23 minutes total spent in review of patient's record and preparation of their chart.

## 2018-11-28 NOTE — Assessment & Plan Note (Signed)
Refused to come in for visit. INR done by home health yesterday was 3.1- will continue on 15mg coumadin. Not on lovenox any more. Recheck 1 week. Call with any concerns.  

## 2018-11-28 NOTE — Assessment & Plan Note (Signed)
Refused to come in for visit. INR done by home health yesterday was 3.1- will continue on 15mg  coumadin. Not on lovenox any more. Recheck 1 week. Call with any concerns.

## 2018-11-29 DIAGNOSIS — G4733 Obstructive sleep apnea (adult) (pediatric): Secondary | ICD-10-CM | POA: Diagnosis not present

## 2018-11-30 ENCOUNTER — Telehealth: Payer: Self-pay | Admitting: Family Medicine

## 2018-11-30 DIAGNOSIS — I872 Venous insufficiency (chronic) (peripheral): Secondary | ICD-10-CM | POA: Diagnosis not present

## 2018-11-30 NOTE — Telephone Encounter (Signed)
Kallie Locks (Social Worker [facility]) called to let Dr Wynetta Emery know that she received a referral for pt, but Medicaid does not cover home health social work visits.  Webb Silversmith states she did call and explain this to pt expressed understanding.  Per Webb Silversmith, pt does have Home health services in place.  Please call Webb Silversmith if more info needed: [phone]

## 2018-11-30 NOTE — Telephone Encounter (Signed)
Noted. Thanks.

## 2018-12-04 NOTE — Progress Notes (Deleted)
Follow-up Outpatient Visit Date: 12/06/2018  Primary Care Provider: [name], DO [address] [place] [address]  Chief Complaint: ***  HPI:  Mr. Koral is a 58 y.o. 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  I last saw him in mid September, at which time he reported improved shortness of breath, which was back to his baseline.  I suggested increasing torsemide to 40 mg BID, which Mr. Burdell did not wish to do.  He presented to the [facility] ED on 11/17/2018 complaining of headache, neck pain, and chest pain.  The pain resolved with conservative management.  High-sensitivity troponin I was flat at 18 & 20.  --------------------------------------------------------------------------------------------------  Past Medical History:  Diagnosis Date  . Asthma   . Brain damage   . Chronic pain of both knees 07/13/2018  . COPD (chronic obstructive pulmonary disease) ([place])   . Depression   . HFrEF (heart failure with reduced ejection fraction) ([place])    a. 03/2018 Echo: EF 25-30%, diff HK. Mod dil LA.  [name] Hypertension   . Neck pain 07/13/2018  . Persistent atrial fibrillation ([place])    a. 03/2018 s/p DCCV; b. CHA2DS2VASc = 1-->Xarelto; c. 05/2018 recurrent afib-->Amio initiated;   . Sleep apnea    Past Surgical History:  Procedure Laterality Date  . CARDIOVERSION N/A 03/24/2018   Procedure: CARDIOVERSION;  Surgeon: Minna Merritts, MD;  Location: [facility] ORS;  Service: Cardiovascular;  Laterality: N/A;  . CARDIOVERSION N/A 08/08/2018   Procedure: CARDIOVERSION;  Surgeon: Nelva Bush, MD;  Location: [facility] ORS;  Service: Cardiovascular;  Laterality: N/A;  . hearnia repair     X 3- total of two surgeries  . LEG SURGERY      No outpatient medications have been  marked as taking for the 12/06/18 encounter (Appointment) with Damichael Hofman, Harrell Gave, MD.    Allergies: Patient has no known allergies.  Social History   Tobacco Use  . Smoking status: Never Smoker  . Smokeless tobacco: Never Used  Substance Use Topics  . Alcohol use: Never    Frequency: Never  . Drug use: Never    Family History  Problem Relation Age of Onset  . Heart failure Mother   . Lung cancer Mother   . Lung cancer Father   . Heart attack Maternal Grandmother   . Heart attack Maternal Grandfather     Review of Systems: A 12-system review of systems was performed and was negative except as noted in the HPI.  --------------------------------------------------------------------------------------------------  Physical Exam: There were no vitals taken for this visit.  General:  *** HEENT: No conjunctival pallor or scleral icterus. Moist mucous membranes.  OP clear. Neck: Supple without lymphadenopathy, thyromegaly, JVD, or HJR. No carotid bruit. Lungs: Normal work of breathing. Clear to auscultation bilaterally without wheezes or crackles. Heart: Regular rate and rhythm without murmurs, rubs, or gallops. Non-displaced PMI. Abd: Bowel sounds present. Soft, NT/ND without hepatosplenomegaly Ext: No lower extremity edema. Radial, PT, and DP pulses are 2+ bilaterally. Skin: Warm and dry without rash.  EKG:  ***  Lab Results  Component Value Date   WBC 6.4 11/17/2018   HGB 13.1 11/17/2018   HCT 38.9 (L) 11/17/2018   MCV 88.8 11/17/2018   PLT 248 11/17/2018    Lab Results  Component Value Date   NA 134 (  L) 11/17/2018   K 4.1 11/17/2018   CL 98 11/17/2018   CO2 25 11/17/2018   BUN 16 11/17/2018   CREATININE 1.22 11/17/2018   GLUCOSE 110 (H) 11/17/2018   ALT 23 11/17/2018    Lab Results  Component Value Date   CHOL 134 08/07/2018   HDL 54 08/07/2018   LDLCALC 49 08/07/2018   TRIG 157 (H) 08/07/2018   CHOLHDL 2.5 08/07/2018     --------------------------------------------------------------------------------------------------  ASSESSMENT AND PLAN: [name], MD 12/04/2018 4:54 PM

## 2018-12-04 NOTE — Telephone Encounter (Signed)
Called Lattie Haw. Left VM for her to return call to the office.

## 2018-12-04 NOTE — Telephone Encounter (Signed)
[name] [name] called in to see if PCP would like another INR to be taken. Please advise.

## 2018-12-04 NOTE — Telephone Encounter (Signed)
Yes please

## 2018-12-05 ENCOUNTER — Telehealth: Payer: Self-pay

## 2018-12-05 ENCOUNTER — Ambulatory Visit: Payer: Self-pay | Admitting: Licensed Clinical Social Worker

## 2018-12-05 DIAGNOSIS — I872 Venous insufficiency (chronic) (peripheral): Secondary | ICD-10-CM | POA: Diagnosis not present

## 2018-12-05 NOTE — Telephone Encounter (Signed)
Needs appointment ASAP, virtual OK

## 2018-12-05 NOTE — Telephone Encounter (Signed)
Lisa notified. She states she sees the patient today and will call results into the office.

## 2018-12-05 NOTE — Chronic Care Management (AMB) (Signed)
  Care Management   Follow Up Note   12/05/2018 Name: Oluwaseyi Raffel MRN: [id number] DOB: 12-17-1960  Referred by: [name], DO Reason for referral : Care Coordination   Phyllip Claw is a 58 y.o. year old male who is a primary care patient of [name], DO. The care management team was consulted for assistance with care management and care coordination needs.    Review of patient status, including review of consultants reports, relevant laboratory and other test results, and collaboration with appropriate care team members and the patient's provider was performed as part of comprehensive patient evaluation and provision of chronic care management services.    LCSW received incoming call from patient on 12/05/2018. Patient is asking if LCSW can send him a text message with a list of local food pantries within [facility]. LCSW sent text message with requested information. Education provided.  The care management team will reach out to the patient again over the next 45 days.   [name], BSW, MSW, [facility] Care Management [email or web address] Phone: [phone]

## 2018-12-05 NOTE — Telephone Encounter (Signed)
Called pt, no answer, left vm to call back to get scheduled.

## 2018-12-05 NOTE — Telephone Encounter (Signed)
PT: 16.1 INR: 1.3

## 2018-12-06 ENCOUNTER — Ambulatory Visit: Payer: Medicaid Other | Admitting: Internal Medicine

## 2018-12-08 DIAGNOSIS — I872 Venous insufficiency (chronic) (peripheral): Secondary | ICD-10-CM | POA: Diagnosis not present

## 2018-12-08 NOTE — Telephone Encounter (Signed)
Called and left a message asking patient to call back to schedule a follow up asap

## 2018-12-11 DIAGNOSIS — I10 Essential (primary) hypertension: Secondary | ICD-10-CM | POA: Diagnosis not present

## 2018-12-11 DIAGNOSIS — I872 Venous insufficiency (chronic) (peripheral): Secondary | ICD-10-CM | POA: Diagnosis not present

## 2018-12-11 DIAGNOSIS — G4733 Obstructive sleep apnea (adult) (pediatric): Secondary | ICD-10-CM | POA: Diagnosis not present

## 2018-12-11 NOTE — Telephone Encounter (Signed)
Called and left a message asking patient to call back to schedule a follow up asap   Dr.Johnson, I have not been able to reach this patient after calling multiple times, what would you like to do?

## 2018-12-12 ENCOUNTER — Telehealth: Payer: Self-pay

## 2018-12-13 DIAGNOSIS — G8929 Other chronic pain: Secondary | ICD-10-CM | POA: Diagnosis not present

## 2018-12-13 DIAGNOSIS — M542 Cervicalgia: Secondary | ICD-10-CM | POA: Diagnosis not present

## 2018-12-14 DIAGNOSIS — I872 Venous insufficiency (chronic) (peripheral): Secondary | ICD-10-CM | POA: Diagnosis not present

## 2018-12-18 DIAGNOSIS — I872 Venous insufficiency (chronic) (peripheral): Secondary | ICD-10-CM | POA: Diagnosis not present

## 2018-12-19 ENCOUNTER — Telehealth: Payer: Self-pay | Admitting: Family Medicine

## 2018-12-19 NOTE — Telephone Encounter (Signed)
Copied from [place] [id number]. Topic: General - Other >> Dec 19, 2018  2:51 PM [name] [name], [place] C wrote: Reason for CRM: pt called in to speak with PCP. Pt says that he has a home health nurse that comes out to the home. Pt says that he was told that he need to come in 4 times a month for BP reading. Pt is upset because he states that he is unable to come in so often and would like to have the approval for his homehealth nurse to check it. Pt says that nurse has been calling in to request the vo, not showing in chart.   Please advise pt

## 2018-12-19 NOTE — Telephone Encounter (Signed)
I don't know where he got this information, but I do not need to see him weekly as long as his INR is under good control. His home health nurse is fine to check his BP, and I'd like her to do that, but I do need to see him to monitor his INR and get it under good control. If he feels like he cannot come into the office, we can attempt to set him up with [facility] to take over his care, but I will need to see him in the office to manage his medical conditions- however I would sincerely hope that would not require weekly visits. OK to set up virtual visit ASAP to discuss INR- we have been trying to call him for that for over a week.

## 2018-12-20 ENCOUNTER — Other Ambulatory Visit: Payer: Self-pay | Admitting: Family Medicine

## 2018-12-20 NOTE — Telephone Encounter (Signed)
Called and left patient a VM asking for him to please return my call.  

## 2018-12-20 NOTE — Telephone Encounter (Signed)
Patient has appointment scheduled for 01/05/19.

## 2018-12-20 NOTE — Telephone Encounter (Signed)
Needs appointment

## 2018-12-20 NOTE — Telephone Encounter (Signed)
Requested medication (s) are due for refill today: yes  Requested medication (s) are on the active medication list: yes  Last refill:  10/30/2018  Future visit scheduled: no  Notes to clinic:  Last filled by historical provider    Requested Prescriptions  Pending Prescriptions Disp Refills   lisinopril (ZESTRIL) 2.5 MG tablet [Pharmacy Med Name: LISINOPRIL 2.5 MG TAB] 30 tablet     Sig: TAKE 1 TABLET BY MOUTH ONCE DAILY     Cardiovascular:  ACE Inhibitors Failed - 12/20/2018 11:29 AM      Failed - Last BP in normal range    BP Readings from Last 1 Encounters:  11/17/18 (!) 148/85         Passed - Cr in normal range and within 180 days    Creatinine, Ser  Date Value Ref Range Status  11/17/2018 1.22 0.61 - 1.24 mg/dL Final         Passed - K in normal range and within 180 days    Potassium  Date Value Ref Range Status  11/17/2018 4.1 3.5 - 5.1 mmol/L Final         Passed - Patient is not pregnant      Passed - Valid encounter within last 6 months    Recent Outpatient Visits          3 weeks ago Other acute pulmonary embolism, unspecified whether acute cor pulmonale present (HCC)   [facility] [place], Megan P, DO   1 month ago Chest pain, unspecified type   [facility] [place], Megan P, DO   1 month ago Other acute pulmonary embolism, unspecified whether acute cor pulmonale present (HCC)   [facility] [place], Megan P, DO   1 month ago Other acute pulmonary embolism, unspecified whether acute cor pulmonale present (HCC)   [facility] [place], Megan P, DO   2 months ago Depression, major, single episode, moderate (HCC)   [facility] [place], [place], DO      Future Appointments            In 5 days Sninsky, Laurette Schimke, MD [facility]   In 4 weeks End, Cristal Deer, MD [facility]            allopurinol (ZYLOPRIM) 100 MG tablet [Pharmacy Med Name:  ALLOPURINOL 100 MG TAB] 30 tablet 3    Sig: TAKE 1 TABLET BY MOUTH ONCE DAILY     Endocrinology:  Gout Agents Passed - 12/20/2018 11:29 AM      Passed - Uric Acid in normal range and within 360 days    Uric Acid  Date Value Ref Range Status  07/04/2018 8.6 3.7 - 8.6 mg/dL Final    Comment:               Therapeutic target for gout patients: <6.0         Passed - Cr in normal range and within 360 days    Creatinine, Ser  Date Value Ref Range Status  11/17/2018 1.22 0.61 - 1.24 mg/dL Final         Passed - Valid encounter within last 12 months    Recent Outpatient Visits          3 weeks ago Other acute pulmonary embolism, unspecified whether acute cor pulmonale present (HCC)   [facility] [place], Megan P, DO   1 month ago Chest pain, unspecified type   [facility], [place],  DO   1 month ago Other acute pulmonary embolism, unspecified whether acute cor pulmonale present ([place])   [place], Megan P, DO   1 month ago Other acute pulmonary embolism, unspecified whether acute cor pulmonale present ([place])   [place], Megan P, DO   2 months ago Depression, major, single episode, moderate ([place])   [place], [place], DO      Future Appointments            In 5 days Sninsky, Herbert Seta, MD [facility]   In 4 weeks End, Harrell Gave, MD [facility]

## 2018-12-21 ENCOUNTER — Telehealth: Payer: Self-pay | Admitting: Family Medicine

## 2018-12-21 DIAGNOSIS — I872 Venous insufficiency (chronic) (peripheral): Secondary | ICD-10-CM | POA: Diagnosis not present

## 2018-12-21 NOTE — Telephone Encounter (Signed)
Lisa home health nurse is calling in for order to check pt's INR for him in the home? Pt is requesting this   CB: [phone]

## 2018-12-21 NOTE — Telephone Encounter (Signed)
Needs an appointment. I would like her to draw INRs, but if he will not have appointments, he is going to be discharged as I cannot take care of him

## 2018-12-21 NOTE — Telephone Encounter (Signed)
Called Lisa and left VM for her to return call to the office.

## 2018-12-22 NOTE — Telephone Encounter (Signed)
Called and left Lattie Haw a VM asking for her to please return my call.

## 2018-12-25 ENCOUNTER — Telehealth: Payer: Self-pay

## 2018-12-25 ENCOUNTER — Other Ambulatory Visit: Payer: Self-pay

## 2018-12-25 ENCOUNTER — Ambulatory Visit (INDEPENDENT_AMBULATORY_CARE_PROVIDER_SITE_OTHER): Payer: Medicaid Other | Admitting: Urology

## 2018-12-25 ENCOUNTER — Other Ambulatory Visit: Payer: Self-pay | Admitting: Family Medicine

## 2018-12-25 ENCOUNTER — Encounter: Payer: Self-pay | Admitting: Urology

## 2018-12-25 VITALS — BP 148/78 | HR 78 | Ht 68.0 in | Wt 395.0 lb

## 2018-12-25 DIAGNOSIS — R350 Frequency of micturition: Secondary | ICD-10-CM

## 2018-12-25 DIAGNOSIS — I872 Venous insufficiency (chronic) (peripheral): Secondary | ICD-10-CM | POA: Diagnosis not present

## 2018-12-25 LAB — URINALYSIS, COMPLETE
Bilirubin, UA: NEGATIVE
Glucose, UA: NEGATIVE
Ketones, UA: NEGATIVE
Nitrite, UA: NEGATIVE
Specific Gravity, UA: 1.02 (ref 1.005–1.030)
Urobilinogen, Ur: 0.2 mg/dL (ref 0.2–1.0)
pH, UA: 7.5 (ref 5.0–7.5)

## 2018-12-25 LAB — MICROSCOPIC EXAMINATION

## 2018-12-25 LAB — BLADDER SCAN AMB NON-IMAGING: Scan Result: 0

## 2018-12-25 MED ORDER — NYSTATIN-TRIAMCINOLONE 100000-0.1 UNIT/GM-% EX OINT: 1.0000 "application " | TOPICAL_OINTMENT | Freq: Two times a day (BID) | CUTANEOUS | 0 refills | Status: DC

## 2018-12-25 NOTE — Telephone Encounter (Signed)
Copied from [place] [phone]. Topic: General - Call Back - No Documentation >> Dec 25, 2018 12:13 PM Erick Blinks wrote: Lattie Haw from [facility] called to report INR results. Tried calling office, please advise  Best contact: [phone] "3.6" = INR results   Routing to provider.

## 2018-12-25 NOTE — Telephone Encounter (Signed)
Called and lvm asking patient to call and schedule an appt to discuss his ptinr results.  

## 2018-12-25 NOTE — Telephone Encounter (Signed)
Please set up appointment to discuss INR with patient. Virtual appointment is fine. I need appointments with him to manage his INR.

## 2018-12-25 NOTE — Telephone Encounter (Signed)
[name] [name] returned my call. Read Dr. Durenda Age message to her. [name] [name] stated that she was about to pull up to the patient's, would let him know what Dr. Wynetta Emery said, and will call with INR report shortly.

## 2018-12-25 NOTE — Progress Notes (Signed)
12/25/18 4:34 PM   [name] [name] 1960/12/13 [id number]  Referring provider: Dorcas Carrow, DO [address] [place],  [place] [address]  CC: Urinary frequency  HPI: I saw [name] [name] in urology clinic in consultation from Dr. Laural Benes for urinary frequency and a number of urinary complaints.  He is an extremely comorbid 58 year old male with COPD, heart failure, atrial fibrillation on anticoagulation, sleep apnea, and significant lower extremity edema on high-dose Lasix.  He complains that as he is gained weight he has a tougher time seeing his penis and it has "disappeared."  He has a number of urinary complaints including leakage, weak stream, urgency, frequency, nocturia, and dysuria.  He drinks primarily ginger ale during the day.  There are no aggravating or alleviating factors.  Severity is moderate.   PVR in clinic today 0 mL.   PMH: Past Medical History:  Diagnosis Date  . Asthma   . Brain damage   . Chronic pain of both knees 07/13/2018  . COPD (chronic obstructive pulmonary disease) (HCC)   . Depression   . HFrEF (heart failure with reduced ejection fraction) (HCC)    a. 03/2018 Echo: EF 25-30%, diff HK. Mod dil LA.  [name] Hypertension   . Neck pain 07/13/2018  . Persistent atrial fibrillation (HCC)    a. 03/2018 s/p DCCV; b. CHA2DS2VASc = 1-->Xarelto; c. 05/2018 recurrent afib-->Amio initiated;   . Sleep apnea     Surgical History: Past Surgical History:  Procedure Laterality Date  . CARDIOVERSION N/A 03/24/2018   Procedure: CARDIOVERSION;  Surgeon: Antonieta Iba, MD;  Location: [facility] ORS;  Service: Cardiovascular;  Laterality: N/A;  . CARDIOVERSION N/A 08/08/2018   Procedure: CARDIOVERSION;  Surgeon: [name], MD;  Location: [facility] ORS;  Service: Cardiovascular;  Laterality: N/A;  . hearnia repair     X 3- total of two surgeries  . LEG SURGERY      Allergies: No Known Allergies  Family History: Family History  Problem Relation Age of Onset  . Heart failure  Mother   . Lung cancer Mother   . Lung cancer Father   . Heart attack Maternal Grandmother   . Heart attack Maternal Grandfather     Social History:  reports that he has never smoked. He has never used smokeless tobacco. He reports that he does not drink alcohol or use drugs.  ROS: Please see flowsheet from today's date for complete review of systems.  Physical Exam: BP (!) 148/78   Pulse 78   Ht 5\' 8"  (1.727 m)   Wt (!) 395 lb (179.2 kg)   BMI 60.06 kg/m    Constitutional: Morbidly obese Cardiovascular: No clubbing, cyanosis, or edema. Respiratory: Normal respiratory effort, no increased work of breathing. 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 Skin: No rashes, bruises or suspicious lesions. Neurologic: Grossly intact, no focal deficits, moving all 4 extremities. Psychiatric: Normal mood and affect.  Laboratory Data: Urinalysis today 11-30 WBCs, 0 RBCs, 0-10 epithelial cells, few bacteria.  Sent for culture.  Assessment & Plan:   In summary, the patient is an extremely comorbid 59 year old male with a constellation of urinary symptoms primarily from his morbid obesity with buried penis and high-dose Lasix.  I have very frank conversation with the patient that his comorbidities and morbid obesity are large contributors to his urinary symptoms.  I recommended a trial  of Mycolog cream to see if this improves his irritation from his buried penis.  We also discussed that there is a provider Dr. Mayer Camel at [facility] who can sometimes offer buried penis repair for patients in a similar situation, but there is no guarantee with his comorbidities and morbid obesity.  He is interested in a referral to see Dr. Francesca Jewett to see if there are any surgical options.  Mycolog cream x2 weeks for possible balanitis  Discussed behavioral strategies regarding his urinary symptoms and weight loss Referral placed to Dr. Francesca Jewett at [facility] for consideration of buried penis repair  A total of 60 minutes were spent face-to-face with the patient, greater than 50% was spent in patient education, counseling, and coordination of care regarding urinary symptoms in the settings of multiple comorbidities as well as morbid obesity.   Billey Co, [facility] [address] [place], [place] [address] [phone]

## 2018-12-26 ENCOUNTER — Telehealth: Payer: Self-pay

## 2018-12-26 NOTE — Telephone Encounter (Signed)
Nystatin prior authoization approved #[id number]

## 2018-12-26 NOTE — Telephone Encounter (Signed)
I'd like to see him virtually sooner to manage his INR

## 2018-12-26 NOTE — Telephone Encounter (Signed)
Patient has an appointment scheduled for 01/05/19, is this ok?

## 2018-12-26 NOTE — Telephone Encounter (Signed)
Ok we have tried to contact him multiple times without a response, unsure of what to do.

## 2018-12-26 NOTE — Telephone Encounter (Signed)
Called and lvm asking patient to call and schedule an appt to discuss his ptinr results.

## 2018-12-27 LAB — CULTURE, URINE COMPREHENSIVE

## 2018-12-28 DIAGNOSIS — I872 Venous insufficiency (chronic) (peripheral): Secondary | ICD-10-CM | POA: Diagnosis not present

## 2019-01-01 ENCOUNTER — Telehealth: Payer: Self-pay | Admitting: Family Medicine

## 2019-01-01 DIAGNOSIS — S069X9S Unspecified intracranial injury with loss of consciousness of unspecified duration, sequela: Secondary | ICD-10-CM | POA: Diagnosis not present

## 2019-01-01 DIAGNOSIS — I872 Venous insufficiency (chronic) (peripheral): Secondary | ICD-10-CM | POA: Diagnosis not present

## 2019-01-01 NOTE — Telephone Encounter (Signed)
Lattie Haw from [facility] called to report that pt has infected (potential) cellulitis on his legs and +3Edema. Lattie Haw states that "pt eats whatever he wants and drinks alcohol" Best contact: [phone]

## 2019-01-02 DIAGNOSIS — S069X9S Unspecified intracranial injury with loss of consciousness of unspecified duration, sequela: Secondary | ICD-10-CM | POA: Diagnosis not present

## 2019-01-03 ENCOUNTER — Telehealth: Payer: Self-pay

## 2019-01-03 DIAGNOSIS — S069X9S Unspecified intracranial injury with loss of consciousness of unspecified duration, sequela: Secondary | ICD-10-CM | POA: Diagnosis not present

## 2019-01-04 ENCOUNTER — Other Ambulatory Visit: Payer: Self-pay

## 2019-01-04 DIAGNOSIS — I872 Venous insufficiency (chronic) (peripheral): Secondary | ICD-10-CM | POA: Diagnosis not present

## 2019-01-04 DIAGNOSIS — S069X9S Unspecified intracranial injury with loss of consciousness of unspecified duration, sequela: Secondary | ICD-10-CM | POA: Diagnosis not present

## 2019-01-05 ENCOUNTER — Encounter: Payer: Self-pay | Admitting: Family Medicine

## 2019-01-05 ENCOUNTER — Telehealth: Payer: Self-pay

## 2019-01-05 ENCOUNTER — Ambulatory Visit (INDEPENDENT_AMBULATORY_CARE_PROVIDER_SITE_OTHER): Payer: Medicaid Other | Admitting: Family Medicine

## 2019-01-05 ENCOUNTER — Ambulatory Visit: Payer: Self-pay | Admitting: *Deleted

## 2019-01-05 ENCOUNTER — Other Ambulatory Visit: Payer: Self-pay

## 2019-01-05 VITALS — BP 103/57 | HR 88 | Temp 99.7°F

## 2019-01-05 DIAGNOSIS — M1A9XX0 Chronic gout, unspecified, without tophus (tophi): Secondary | ICD-10-CM

## 2019-01-05 DIAGNOSIS — D6869 Other thrombophilia: Secondary | ICD-10-CM

## 2019-01-05 DIAGNOSIS — I5022 Chronic systolic (congestive) heart failure: Secondary | ICD-10-CM | POA: Diagnosis not present

## 2019-01-05 DIAGNOSIS — E119 Type 2 diabetes mellitus without complications: Secondary | ICD-10-CM | POA: Diagnosis not present

## 2019-01-05 DIAGNOSIS — R791 Abnormal coagulation profile: Secondary | ICD-10-CM | POA: Diagnosis not present

## 2019-01-05 DIAGNOSIS — S069X9S Unspecified intracranial injury with loss of consciousness of unspecified duration, sequela: Secondary | ICD-10-CM | POA: Diagnosis not present

## 2019-01-05 DIAGNOSIS — Z86711 Personal history of pulmonary embolism: Secondary | ICD-10-CM | POA: Diagnosis not present

## 2019-01-05 DIAGNOSIS — I10 Essential (primary) hypertension: Secondary | ICD-10-CM

## 2019-01-05 DIAGNOSIS — L97919 Non-pressure chronic ulcer of unspecified part of right lower leg with unspecified severity: Secondary | ICD-10-CM

## 2019-01-05 MED ORDER — FERROUS SULFATE 325 (65 FE) MG PO TABS: 325.0000 mg | ORAL_TABLET | Freq: Two times a day (BID) | ORAL | 3 refills | Status: DC

## 2019-01-05 NOTE — Progress Notes (Signed)
BP (!) 103/57   Pulse 88   Temp 99.7 F (37.6 C)   SpO2 96%    Subjective:    Patient ID: [name] [name], male    DOB: 08/26/1960, 58 y.o.   MRN: [id number]  HPI: [name] [name] is a 58 y.o. male  Chief Complaint  Patient presents with  . Cellulitis    X 3 weeks bilateral left is worse than right  . Medication Refill    metoprolol, amiodarone ferrous sulfate  . Pain    Patient would like oxycotin instead of oxycodone    SKIN INFECTION Duration: >3 weeks Location: back of his R calf History of trauma in area: no Pain: yes Quality: aching and sore Severity: severe Redness: yes Swelling: yes Oozing: yes Pus: yes Fevers: no Nausea/vomiting: no Status: worse Treatments attempted: Unna boots Tetanus: Not UTD  Coumadin Management.  The expected duration of coumadin treatment is lifelong The reason for anticoagulation is  Acute PE [name] [name] has been having his INR checked at home by home health. Has been ranging from 1.5-3. He has been refusing appointments with me either by phone or in person. He has been drinking ETOH at home (although he denies this). It is unclear how he has been taking his medicine.    Present Coumadin dose: 69m daily Goal: 2.0-3.0  Excessive bruising: no Nose bleeding: no Rectal bleeding: no Prolonged menstrual cycles: N/A Eating diet with consistent amounts of foods containing Vitamin K:unknown Any recent antibiotic use? no  Saw Pain management and they are refusing to give him oxycontin instead of oxycodone. He notes that his legs and back have been hurting a lot and that he does not want to go back to see them because they will not give him what he wants. He is otherwise feeling well with no other concerns or complaints at this time.   Relevant past medical, surgical, family and social history reviewed and updated as indicated. Interim medical history since our last visit reviewed. Allergies and medications reviewed and updated.  Review of  Systems  Per HPI unless specifically indicated above     Objective:    BP (!) 103/57   Pulse 88   Temp 99.7 F (37.6 C)   SpO2 96%   Wt Readings from Last 3 Encounters:  12/25/18 (!) 395 lb (179.2 kg)  11/17/18 (!) 400 lb (181.4 kg)  11/01/18 (!) 405 lb (183.7 kg)    Physical Exam Vitals signs and nursing note reviewed.  Constitutional:      General: He is not in acute distress.    Appearance: Normal appearance. He is obese. He is not ill-appearing, toxic-appearing or diaphoretic.  HENT:     Head: Normocephalic and atraumatic.     Right Ear: External ear normal.     Left Ear: External ear normal.     Nose: Nose normal.     Mouth/Throat:     Mouth: Mucous membranes are moist.     Pharynx: Oropharynx is clear.  Eyes:     General: No scleral icterus.       Right eye: No discharge.        Left eye: No discharge.     Extraocular Movements: Extraocular movements intact.     Conjunctiva/sclera: Conjunctivae normal.     Pupils: Pupils are equal, round, and reactive to light.  Neck:     Musculoskeletal: Normal range of motion and neck supple.  Cardiovascular:     Rate and Rhythm: Normal rate and regular rhythm.  Pulses: Normal pulses.     Heart sounds: Normal heart sounds. No murmur. No friction rub. No gallop.   Pulmonary:     Effort: Pulmonary effort is normal. No respiratory distress.     Breath sounds: Normal breath sounds. No stridor. No wheezing, rhonchi or rales.  Chest:     Chest wall: No tenderness.  Musculoskeletal: Normal range of motion.  Skin:    General: Skin is warm and dry.     Capillary Refill: Capillary refill takes less than 2 seconds.     Coloration: Skin is not jaundiced or pale.     Findings: No bruising, erythema, lesion or rash.     Comments: With unna boots bilaterally, no visible erythema or heat  Neurological:     General: No focal deficit present.     Mental Status: He is alert and oriented to person, place, and time. Mental status is at  baseline.  Psychiatric:        Mood and Affect: Mood normal.        Behavior: Behavior normal.        Thought Content: Thought content normal.        Judgment: Judgment normal.     Results for orders placed or performed in visit on 01/05/19  Bayer DCA Hb A1c Waived  Result Value Ref Range   HB A1C (BAYER DCA - WAIVED) 6.0 <7.0 %  CBC with Differential OUT  Result Value Ref Range   WBC 6.4 3.4 - 10.8 x10E3/uL   RBC 3.98 (L) 4.14 - 5.80 x10E6/uL   Hemoglobin 12.2 (L) 13.0 - 17.7 g/dL   Hematocrit 36.6 (L) 37.5 - 51.0 %   MCV 92 79 - 97 fL   MCH 30.7 26.6 - 33.0 pg   MCHC 33.3 31.5 - 35.7 g/dL   RDW 14.5 11.6 - 15.4 %   Platelets 286 150 - 450 x10E3/uL   Neutrophils 67 Not Estab. %   Lymphs 13 Not Estab. %   Monocytes 13 Not Estab. %   Eos 5 Not Estab. %   Basos 1 Not Estab. %   Neutrophils Absolute 4.4 1.4 - 7.0 x10E3/uL   Lymphocytes Absolute 0.9 0.7 - 3.1 x10E3/uL   Monocytes Absolute 0.9 0.1 - 0.9 x10E3/uL   EOS (ABSOLUTE) 0.3 0.0 - 0.4 x10E3/uL   Basophils Absolute 0.0 0.0 - 0.2 x10E3/uL   Immature Granulocytes 1 Not Estab. %   Immature Grans (Abs) 0.0 0.0 - 0.1 x10E3/uL  Comp Met (CMET)  Result Value Ref Range   Glucose 106 (H) 65 - 99 mg/dL   BUN 23 6 - 24 mg/dL   Creatinine, Ser 1.40 (H) 0.76 - 1.27 mg/dL   GFR calc non Af Amer 55 (L) >59 mL/min/1.73   GFR calc Af Amer 64 >59 mL/min/1.73   BUN/Creatinine Ratio 16 9 - 20   Sodium 134 134 - 144 mmol/L   Potassium 4.8 3.5 - 5.2 mmol/L   Chloride 92 (L) 96 - 106 mmol/L   CO2 28 20 - 29 mmol/L   Calcium 9.2 8.7 - 10.2 mg/dL   Total Protein 7.3 6.0 - 8.5 g/dL   Albumin 3.9 3.8 - 4.9 g/dL   Globulin, Total 3.4 1.5 - 4.5 g/dL   Albumin/Globulin Ratio 1.1 (L) 1.2 - 2.2   Bilirubin Total 0.2 0.0 - 1.2 mg/dL   Alkaline Phosphatase 93 39 - 117 IU/L   AST 16 0 - 40 IU/L   ALT 14 0 - 44 IU/L  CoaguChek  XS/INR Waived  Result Value Ref Range   INR >8.0 (HH) 0.9 - 1.1   Prothrombin Time >96.0 sec  Lipid Panel w/o  Chol/HDL Ratio OUT  Result Value Ref Range   Cholesterol, Total 159 100 - 199 mg/dL   Triglycerides 113 0 - 149 mg/dL   HDL 66 >39 mg/dL   VLDL Cholesterol Cal 20 5 - 40 mg/dL   LDL Chol Calc (NIH) 73 0 - 99 mg/dL  TSH  Result Value Ref Range   TSH 12.000 (H) 0.450 - 4.500 uIU/mL      Assessment & Plan:   Problem List Items Addressed This Visit      Cardiovascular and Mediastinum   Chronic systolic heart failure (HCC)    Appears euvolemic today. Continue to monitor.       Relevant Orders   Bayer DCA Hb A1c Waived (Completed)   CBC with Differential OUT (Completed)   Comp Met (CMET) (Completed)   CoaguChek XS/INR Waived (Completed)   Lipid Panel w/o Chol/HDL Ratio OUT (Completed)   TSH (Completed)   UA/M w/rflx Culture, Routine   HTN (hypertension) - Primary    Running low today. Continue current regimen. Continue to monitor.       Relevant Orders   Bayer DCA Hb A1c Waived (Completed)   CBC with Differential OUT (Completed)   Comp Met (CMET) (Completed)   CoaguChek XS/INR Waived (Completed)   Lipid Panel w/o Chol/HDL Ratio OUT (Completed)   TSH (Completed)   UA/M w/rflx Culture, Routine     Endocrine   Diet-controlled diabetes mellitus ([place])    Labs drawn today. Await results. Treat as needed.       Relevant Orders   Bayer DCA Hb A1c Waived (Completed)   CBC with Differential OUT (Completed)   Comp Met (CMET) (Completed)   CoaguChek XS/INR Waived (Completed)   Lipid Panel w/o Chol/HDL Ratio OUT (Completed)   TSH (Completed)   UA/M w/rflx Culture, Routine     Hematopoietic and Hemostatic   Acquired thrombophilia ([place])    Labs checked today. Await results.      Relevant Orders   Bayer DCA Hb A1c Waived (Completed)   CBC with Differential OUT (Completed)   Comp Met (CMET) (Completed)   CoaguChek XS/INR Waived (Completed)   Lipid Panel w/o Chol/HDL Ratio OUT (Completed)   TSH (Completed)   UA/M w/rflx Culture, Routine     Other   Gout    Checking  labs today. Await results. Call with any concerns.       Relevant Orders   Bayer DCA Hb A1c Waived (Completed)   CBC with Differential OUT (Completed)   Comp Met (CMET) (Completed)   CoaguChek XS/INR Waived (Completed)   Lipid Panel w/o Chol/HDL Ratio OUT (Completed)   TSH (Completed)   UA/M w/rflx Culture, Routine   History of pulmonary embolism (on Xarelto)    Very unclear on how he's been taking his medicine. Very non-compliant. Cannot take long-actings due to his weight. Will not keep appointments to manage his coumadin.       Relevant Orders   Bayer DCA Hb A1c Waived (Completed)   CBC with Differential OUT (Completed)   Comp Met (CMET) (Completed)   CoaguChek XS/INR Waived (Completed)   Lipid Panel w/o Chol/HDL Ratio OUT (Completed)   TSH (Completed)   UA/M w/rflx Culture, Routine   Subtherapeutic international normalized ratio (INR)    INR >8.0. Advised patient to go to ER. Patient refused. Rescue called. Patient refused to  go. Patient is aware of the risks of not going to the ER, including death. He still refuses to go. Advised to hold coumadin for 3 days and to follow up on Monday in Person.         Other Visit Diagnoses    Ulcer of right lower extremity, unspecified ulcer stage ([place])       Advised ER evaluation given UNNA boots and supratherapeutic INR. Declined.        Follow up plan: Return in about 3 days (around 01/08/2019) for INR in person.    >50 minutes spent in counseling and coordination of care with patient.

## 2019-01-05 NOTE — Patient Instructions (Addendum)
DO NOT TAKE YOUR COUMADIN FOR 3 DAYS  I STRONGLY ADVISE YOU TO GO TO THE EMERGENCY ROOM TO GET YOUR INR UNDER BETTER CONTROL  RETURN HERE Monday FOR RECHECK ON YOUR INR   Chrissie Noa,   As we discussed today in your appointment, as it is very difficult for you to come to your appointments and you will not answer our phone calls when we call, this makes it very difficult for [place] to take care of you. Based on your non-compliance, we will not be able to continue to care for you. We will continue to care for you for the next 30 days. We will get you set up with [facility] to make sure that you have the care you need. Please contact us with any questions and we will be happy to provide you care until 02/04/19.   Sincerely,   Park Liter, DO

## 2019-01-05 NOTE — Chronic Care Management (AMB) (Signed)
Chronic Care Management   Follow Up Note   01/05/2019 Name: [name] [name] MRN: [id number] DOB: 31-Oct-1960  Referred by: Dorcas Carrow, DO Reason for referral : No chief complaint on file.   [name] [name] is a 58 y.o. year old male who is a primary care patient of Dorcas Carrow, DO. The CCM team was consulted for assistance with chronic disease management and care coordination needs.    Review of patient status, including review of consultants reports, relevant laboratory and other test results, and collaboration with appropriate care team members and the patient's provider was performed as part of comprehensive patient evaluation and provision of chronic care management services.    SDOH (Social Determinants of Health) screening performed today: Financial Strain . See Care Plan for related entries.   Outpatient Encounter Medications as of 01/05/2019  Medication Sig  . albuterol (VENTOLIN HFA) 108 (90 Base) MCG/ACT inhaler Inhale 2 puffs into the lungs every 6 (six) hours as needed for wheezing or shortness of breath.  . allopurinol (ZYLOPRIM) 100 MG tablet TAKE 1 TABLET BY MOUTH ONCE DAILY  . amiodarone (PACERONE) 200 MG tablet Take 1 tablet (200 mg total) by mouth daily.  [name] atorvastatin (LIPITOR) 40 MG tablet TAKE 1 TABLET BY MOUTH ONCE DAILY (Patient taking differently: Take 40 mg by mouth daily. )  . DULoxetine (CYMBALTA) 20 MG capsule Take 20 mg by mouth daily.  . ferrous sulfate 325 (65 FE) MG tablet Take 1 tablet (325 mg total) by mouth 2 (two) times daily with a meal.  . hydrocortisone (ANUSOL-HC) 25 MG suppository Place 1 suppository (25 mg total) rectally 2 (two) times daily.  [name] levothyroxine (SYNTHROID) 50 MCG tablet Take 1 tablet (50 mcg total) by mouth daily before breakfast.  . lisinopril (ZESTRIL) 2.5 MG tablet TAKE 1 TABLET BY MOUTH ONCE DAILY  . meloxicam (MOBIC) 15 MG tablet Take 1 tablet (15 mg total) by mouth daily.  . metoprolol succinate (TOPROL-XL) 50 MG  24 hr tablet Take 50 mg by mouth 2 (two) times daily. Take with or immediately following a meal.  . nitroGLYCERIN (NITROSTAT) 0.4 MG SL tablet Place 1 tablet (0.4 mg total) under the tongue every 5 (five) minutes as needed for chest pain.  [name] nystatin-triamcinolone ointment (MYCOLOG) Apply 1 application topically 2 (two) times daily.  [name] spironolactone (ALDACTONE) 25 MG tablet Take 1 tablet (25 mg total) by mouth daily.  [name] torsemide (DEMADEX) 20 MG tablet Take 40 mg by mouth daily.  . traZODone (DESYREL) 150 MG tablet TAKE 1 TABLET BY MOUTH AT BEDTIME AS NEEDED SLEEP  . warfarin (COUMADIN) 5 MG tablet Take 3 tablets (15 mg total) by mouth daily.   No facility-administered encounter medications on file as of 01/05/2019.      Goals Addressed            This Visit's Progress   . RN-I need help managing my overall health (pt-stated)       Current Barriers:  . Lacks caregiver support.  . Corporate treasurer.  . Literacy barriers . Transportation barriers . Difficulty obtaining medications . Cognitive Deficits . Chronic Disease Management support and education needs related to managing multiple chronic diseases as evidenced by multiple hospitalizations in the last 6 months.  Nurse Case Manager Clinical Goal(s):  [name] Over the next 90 days, patient will not experience hospital admission. Hospital Admissions in last 6 months = 5  Interventions:  . Evaluation of current treatment plan related to warfarin and patient's adherence to  plan as established by provider. Nash Dimmer with PCP regarding patient's non adherence to scheduled follow up and need for an at home visiting PCP group . Discussed plans with patient for ongoing care management follow up and provided patient with direct contact information for care management team . Reviewed scheduled/upcoming provider appointments including:  Patient is to come back to see PCP Monday 11/23  . Patient maintains he can not afford to come to PCP  office weekly for INR blood draws. PCP has discussed with patient he could have [place] draw INR but he must answer the phone for f/u visits. Patient reports also not having enough phone minutes for all the calls he has been receiving.  [name] called today related to patient's critical INR result of 8. Patient refused to go to ED.  . Current INR- 8- recheck to be done at appt on Monday. Patient counseled by PCP on correct dosing of Coumadin through the weekend and the importance of being careful not to fall.  Jama Flavors with patient to his car and obtained Medicaid vouchers for transportation, filled this out for patient and left at front office of [facility] for pick up.  . Plan to call [facility] to inquire about patient being set up withhteir services. [phone]- looking at their website I am unable to determine if they accept medicaid patient's.   Patient Self Care Activities:  . Currently UNABLE TO independently manage multiple chronic disease processes as evidenced by 5 hospital admits in the last 6 mos  Please see past updates related to this goal by clicking on the "Past Updates" button in the selected goal  Previous goal of weight loss d/c'd related to patient's new focus          The care management team will reach out to the patient again over the next 30 days.  The patient has been provided with contact information for the care management team and has been advised to call with any health related questions or concerns.    Merlene Morse Caryn Gienger RN, BSN Nurse Case Editor, commissioning Family Practice/[facility] Care [facility]  [phone]) Business Mobile

## 2019-01-05 NOTE — Telephone Encounter (Signed)
Will discuss when he's in the office today. If he is unable or unwilling to do visits- we will get him set up with a home health provider (like [facility]) and discharge patient.

## 2019-01-05 NOTE — Telephone Encounter (Signed)
Will see him in the office this PM

## 2019-01-05 NOTE — Assessment & Plan Note (Addendum)
INR >8.0. Advised patient to go to ER. Patient refused. [facility] called. Patient refused to go. Patient is aware of the risks of not going to the ER, including death. He still refuses to go. Advised to hold coumadin for 3 days and to follow up on Monday in Person.

## 2019-01-06 DIAGNOSIS — S069X9S Unspecified intracranial injury with loss of consciousness of unspecified duration, sequela: Secondary | ICD-10-CM | POA: Diagnosis not present

## 2019-01-06 LAB — COMPREHENSIVE METABOLIC PANEL
ALT: 14 IU/L (ref 0–44)
AST: 16 IU/L (ref 0–40)
Albumin/Globulin Ratio: 1.1 — ABNORMAL LOW (ref 1.2–2.2)
Albumin: 3.9 g/dL (ref 3.8–4.9)
Alkaline Phosphatase: 93 IU/L (ref 39–117)
BUN/Creatinine Ratio: 16 (ref 9–20)
BUN: 23 mg/dL (ref 6–24)
Bilirubin Total: 0.2 mg/dL (ref 0.0–1.2)
CO2: 28 mmol/L (ref 20–29)
Calcium: 9.2 mg/dL (ref 8.7–10.2)
Chloride: 92 mmol/L — ABNORMAL LOW (ref 96–106)
Creatinine, Ser: 1.4 mg/dL — ABNORMAL HIGH (ref 0.76–1.27)
GFR calc Af Amer: 64 mL/min/{1.73_m2} (ref >59)
GFR calc non Af Amer: 55 mL/min/{1.73_m2} — ABNORMAL LOW (ref >59)
Globulin, Total: 3.4 g/dL (ref 1.5–4.5)
Glucose: 106 mg/dL — ABNORMAL HIGH (ref 65–99)
Potassium: 4.8 mmol/L (ref 3.5–5.2)
Sodium: 134 mmol/L (ref 134–144)
Total Protein: 7.3 g/dL (ref 6.0–8.5)

## 2019-01-06 LAB — LIPID PANEL W/O CHOL/HDL RATIO
Cholesterol, Total: 159 mg/dL (ref 100–199)
HDL: 66 mg/dL (ref >39)
LDL Chol Calc (NIH): 73 mg/dL (ref 0–99)
Triglycerides: 113 mg/dL (ref 0–149)
VLDL Cholesterol Cal: 20 mg/dL (ref 5–40)

## 2019-01-06 LAB — CBC WITH DIFFERENTIAL/PLATELET
Basophils Absolute: 0 10*3/uL (ref 0.0–0.2)
Basos: 1 %
EOS (ABSOLUTE): 0.3 10*3/uL (ref 0.0–0.4)
Eos: 5 %
Hematocrit: 36.6 % — ABNORMAL LOW (ref 37.5–51.0)
Hemoglobin: 12.2 g/dL — ABNORMAL LOW (ref 13.0–17.7)
Immature Grans (Abs): 0 10*3/uL (ref 0.0–0.1)
Immature Granulocytes: 1 %
Lymphocytes Absolute: 0.9 10*3/uL (ref 0.7–3.1)
Lymphs: 13 %
MCH: 30.7 pg (ref 26.6–33.0)
MCHC: 33.3 g/dL (ref 31.5–35.7)
MCV: 92 fL (ref 79–97)
Monocytes Absolute: 0.9 10*3/uL (ref 0.1–0.9)
Monocytes: 13 %
Neutrophils Absolute: 4.4 10*3/uL (ref 1.4–7.0)
Neutrophils: 67 %
Platelets: 286 10*3/uL (ref 150–450)
RBC: 3.98 x10E6/uL — ABNORMAL LOW (ref 4.14–5.80)
RDW: 14.5 % (ref 11.6–15.4)
WBC: 6.4 10*3/uL (ref 3.4–10.8)

## 2019-01-06 LAB — COAGUCHEK XS/INR WAIVED
INR: >8 (ref 0.9–1.1)
Prothrombin Time: >96 s

## 2019-01-06 LAB — TSH: TSH: 12 u[IU]/mL — ABNORMAL HIGH (ref 0.450–4.500)

## 2019-01-06 LAB — BAYER DCA HB A1C WAIVED: HB A1C (BAYER DCA - WAIVED): 6 % (ref <7.0)

## 2019-01-07 ENCOUNTER — Encounter: Payer: Self-pay | Admitting: Family Medicine

## 2019-01-07 DIAGNOSIS — S069X9S Unspecified intracranial injury with loss of consciousness of unspecified duration, sequela: Secondary | ICD-10-CM | POA: Diagnosis not present

## 2019-01-07 NOTE — Assessment & Plan Note (Signed)
Very unclear on how he's been taking his medicine. Very non-compliant. Cannot take long-actings due to his weight. Will not keep appointments to manage his coumadin.

## 2019-01-07 NOTE — Assessment & Plan Note (Signed)
Labs checked today. Await results.  

## 2019-01-07 NOTE — Assessment & Plan Note (Signed)
Running low today. Continue current regimen. Continue to monitor.

## 2019-01-07 NOTE — Assessment & Plan Note (Signed)
Appears euvolemic today. Continue to monitor.

## 2019-01-07 NOTE — Assessment & Plan Note (Signed)
Labs drawn today. Await results. Treat as needed.  

## 2019-01-07 NOTE — Assessment & Plan Note (Signed)
Checking labs today. Await results. Call with any concerns.  

## 2019-01-08 ENCOUNTER — Telehealth: Payer: Self-pay | Admitting: Family Medicine

## 2019-01-08 DIAGNOSIS — I872 Venous insufficiency (chronic) (peripheral): Secondary | ICD-10-CM | POA: Diagnosis not present

## 2019-01-08 DIAGNOSIS — S069X9S Unspecified intracranial injury with loss of consciousness of unspecified duration, sequela: Secondary | ICD-10-CM | POA: Diagnosis not present

## 2019-01-08 NOTE — Telephone Encounter (Signed)
Please let him know that his thyroid is not under good control- it is worse than last time. Can we make sure he's coming in today to recheck his INR as well? Thanks!

## 2019-01-08 NOTE — Telephone Encounter (Signed)
Noted. Was advised to go to the ER on Friday for further evaluation and refused. I have been unable to see his legs. Will not call in Abx without appointment.

## 2019-01-08 NOTE — Telephone Encounter (Signed)
Unfortunately, we cannot have him transfer within the office.

## 2019-01-08 NOTE — Telephone Encounter (Signed)
Called pt and relayed Dr Durenda Age message, pt said ok thank you very much and ended call.

## 2019-01-08 NOTE — Telephone Encounter (Signed)
Called patient, he wanted to come in tomorrow for an appt at 1:45pm, ok by Dr.Johnson. Patient inquired about antibiotics for his leg, verbal from [facility] that it will discussed at his appt on 01/09/19, patient states that he needs them today and that he probably will not be in tomorrow because he will be looking for a new doctor.

## 2019-01-08 NOTE — Telephone Encounter (Signed)
Copied from [place] [id number]. Topic: Appointment Scheduling - Transfer of Care >> Jan 08, 2019  3:00 PM Rainey Pines A wrote: Patient is unhappy with doctor and wants to transfer his care to [facility]. Please advise

## 2019-01-08 NOTE — Patient Instructions (Signed)
Thank you allowing the Chronic Care Management Team to be a part of your care! It was a pleasure speaking with you today!  CCM (Chronic Care Management) Team   Lexander Tremblay RN, BSN Nurse Care Coordinator  [phone]  [facility] PharmD  Clinical Pharmacist  [phone]  [name] LCSW Clinical Social Worker [phone]  Goals Addressed            This Visit's Progress   . RN-I need help managing my overall health (pt-stated)       Current Barriers:  . Lacks caregiver support.  . Film/video editor.  . Literacy barriers . Transportation barriers . Difficulty obtaining medications . Cognitive Deficits . Chronic Disease Management support and education needs related to managing multiple chronic diseases as evidenced by multiple hospitalizations in the last 6 months.  Nurse Case Manager Clinical Goal(s):  [name] Over the next 90 days, patient will not experience hospital admission. Hospital Admissions in last 6 months = 5  Interventions:  . Evaluation of current treatment plan related to warfarin and patient's adherence to plan as established by provider. Nash Dimmer with PCP regarding patient's non adherence to scheduled follow up and need for an at home visiting PCP group . Discussed plans with patient for ongoing care management follow up and provided patient with direct contact information for care management team . Reviewed scheduled/upcoming provider appointments including:  Patient is to come back to see PCP Monday 11/23  . Patient maintains he can not afford to come to PCP office weekly for INR blood draws. PCP has discussed with patient he could have [place] draw INR but he must answer the phone for f/u visits. Patient reports also not having enough phone minutes for all the calls he has been receiving.  [name] called today related to patient's critical INR result of 8. Patient refused to go to ED.  . Current INR- 8- recheck to be done at appt on Monday. Patient  counseled by PCP on correct dosing of Coumadin through the weekend and the importance of being careful not to fall.  Jama Flavors with patient to his car and obtained Medicaid vouchers for transportation, filled this out for patient and left at front office of [facility] for pick up.  . Plan to call [facility] to inquire about patient being set up withhteir services. [phone]- looking at their website I am unable to determine if they accept medicaid patient's.   Patient Self Care Activities:  . Currently UNABLE TO independently manage multiple chronic disease processes as evidenced by 5 hospital admits in the last 6 mos  Please see past updates related to this goal by clicking on the "Past Updates" button in the selected goal  Previous goal of weight loss d/c'd related to patient's new focus         The patient verbalized understanding of instructions provided today and declined a print copy of patient instruction materials.   The patient has been provided with contact information for the care management team and has been advised to call with any health related questions or concerns.

## 2019-01-09 ENCOUNTER — Encounter: Payer: Self-pay | Admitting: Family Medicine

## 2019-01-09 ENCOUNTER — Ambulatory Visit: Payer: Self-pay | Admitting: *Deleted

## 2019-01-09 DIAGNOSIS — I5022 Chronic systolic (congestive) heart failure: Secondary | ICD-10-CM

## 2019-01-09 DIAGNOSIS — Z91199 Patient's noncompliance with other medical treatment and regimen due to unspecified reason: Secondary | ICD-10-CM | POA: Insufficient documentation

## 2019-01-09 DIAGNOSIS — Z9119 Patient's noncompliance with other medical treatment and regimen: Secondary | ICD-10-CM | POA: Insufficient documentation

## 2019-01-09 DIAGNOSIS — S069X9S Unspecified intracranial injury with loss of consciousness of unspecified duration, sequela: Secondary | ICD-10-CM | POA: Diagnosis not present

## 2019-01-09 NOTE — Chronic Care Management (AMB) (Signed)
Chronic Care Management   Follow Up Note   01/09/2019 Name: [name] [name] MRN: [id number] DOB: [birth year]  Referred by: [name] Carrow, DO Reason for referral : Chronic Care Management (New PCP)   [name] [name] is a 58 y.o. year old male who is a primary care patient of [name] Carrow, DO. The CCM team was consulted for assistance with chronic disease management and care coordination needs.    Review of patient status, including review of consultants reports, relevant laboratory and other test results, and collaboration with appropriate care team members and the patient's provider was performed as part of comprehensive patient evaluation and provision of chronic care management services.    SDOH (Social Determinants of Health) screening performed today: None. See Care Plan for related entries.   Outpatient Encounter Medications as of 01/09/2019  Medication Sig  . albuterol (VENTOLIN HFA) 108 (90 Base) MCG/ACT inhaler Inhale 2 puffs into the lungs every 6 (six) hours as needed for wheezing or shortness of breath.  . allopurinol (ZYLOPRIM) 100 MG tablet TAKE 1 TABLET BY MOUTH ONCE DAILY  . amiodarone (PACERONE) 200 MG tablet Take 1 tablet (200 mg total) by mouth daily.  [name] atorvastatin (LIPITOR) 40 MG tablet TAKE 1 TABLET BY MOUTH ONCE DAILY (Patient taking differently: Take 40 mg by mouth daily. )  . DULoxetine (CYMBALTA) 20 MG capsule Take 20 mg by mouth daily.  . ferrous sulfate 325 (65 FE) MG tablet Take 1 tablet (325 mg total) by mouth 2 (two) times daily with a meal.  . hydrocortisone (ANUSOL-HC) 25 MG suppository Place 1 suppository (25 mg total) rectally 2 (two) times daily.  [name] levothyroxine (SYNTHROID) 50 MCG tablet Take 1 tablet (50 mcg total) by mouth daily before breakfast.  . lisinopril (ZESTRIL) 2.5 MG tablet TAKE 1 TABLET BY MOUTH ONCE DAILY  . meloxicam (MOBIC) 15 MG tablet Take 1 tablet (15 mg total) by mouth daily.  . metoprolol succinate (TOPROL-XL) 50 MG 24 hr  tablet Take 50 mg by mouth 2 (two) times daily. Take with or immediately following a meal.  . nitroGLYCERIN (NITROSTAT) 0.4 MG SL tablet Place 1 tablet (0.4 mg total) under the tongue every 5 (five) minutes as needed for chest pain.  [name] nystatin-triamcinolone ointment (MYCOLOG) Apply 1 application topically 2 (two) times daily.  [name] spironolactone (ALDACTONE) 25 MG tablet Take 1 tablet (25 mg total) by mouth daily.  [name] torsemide (DEMADEX) 20 MG tablet Take 40 mg by mouth daily.  . traZODone (DESYREL) 150 MG tablet TAKE 1 TABLET BY MOUTH AT BEDTIME AS NEEDED SLEEP  . warfarin (COUMADIN) 5 MG tablet Take 3 tablets (15 mg total) by mouth daily.   No facility-administered encounter medications on file as of 01/09/2019.      Goals Addressed            This Visit's Progress   . COMPLETED: RN-I need help managing my overall health (pt-stated)       Current Barriers:  . Lacks caregiver support.  . Corporate treasurer.  . Literacy barriers . Transportation barriers . Difficulty obtaining medications . Cognitive Deficits . Chronic Disease Management support and education needs related to managing multiple chronic diseases as evidenced by multiple hospitalizations in the last 6 months.  Nurse Case Manager Clinical Goal(s):  [name] Over the next 90 days, patient will not experience hospital admission. Hospital Admissions in last 6 months = 5  Interventions:  . Evaluation of current treatment plan related to warfarin and patient's adherence to plan  as established by provider. [name] [name] with PCP regarding patient's decision to see another PCP.  [name] Discussed plans with patient for ongoing care management follow up and provided patient with direct contact information for care management team Will make CCM at new PCP office aware of patient's transfer of service.  . Patient called RNCM and requested Medicaid transportation forms be mailed back to his home.  . Talked with patient a long time about the  importance of getting in to see new PCP quickly and the importance of follow up.  . Compassionate listening as patient talked about the difficulties of the Holiday season as it is the first [place] season after losing his spouse. Patient talking about how he would like to adopt a pet for company. Gave patient number to [employer].  . Patient continues to have [facility] for bilateral leg dressings/una boot changes.   Patient Self Care Activities:  . Currently UNABLE TO independently manage multiple chronic disease processes as evidenced by 5 hospital admits in the last 6 mos  Please see past updates related to this goal by clicking on the "Past Updates" button in the selected goal  Previous goal of weight loss d/c'd related to patient's new focus          No further follow up required: Patient has chosen a new PCP   [name] Ciampi RN, BSN Nurse Case Editor, commissioning Family Practice/[facility] Care Management  [phone]) Business Mobile

## 2019-01-10 DIAGNOSIS — S069X9S Unspecified intracranial injury with loss of consciousness of unspecified duration, sequela: Secondary | ICD-10-CM | POA: Diagnosis not present

## 2019-01-11 DIAGNOSIS — S069X9S Unspecified intracranial injury with loss of consciousness of unspecified duration, sequela: Secondary | ICD-10-CM | POA: Diagnosis not present

## 2019-01-12 DIAGNOSIS — I872 Venous insufficiency (chronic) (peripheral): Secondary | ICD-10-CM | POA: Diagnosis not present

## 2019-01-12 DIAGNOSIS — S069X9S Unspecified intracranial injury with loss of consciousness of unspecified duration, sequela: Secondary | ICD-10-CM | POA: Diagnosis not present

## 2019-01-13 DIAGNOSIS — S069X9S Unspecified intracranial injury with loss of consciousness of unspecified duration, sequela: Secondary | ICD-10-CM | POA: Diagnosis not present

## 2019-01-14 DIAGNOSIS — S069X9S Unspecified intracranial injury with loss of consciousness of unspecified duration, sequela: Secondary | ICD-10-CM | POA: Diagnosis not present

## 2019-01-15 ENCOUNTER — Ambulatory Visit: Payer: Self-pay | Admitting: *Deleted

## 2019-01-15 DIAGNOSIS — I872 Venous insufficiency (chronic) (peripheral): Secondary | ICD-10-CM | POA: Diagnosis not present

## 2019-01-15 DIAGNOSIS — L97919 Non-pressure chronic ulcer of unspecified part of right lower leg with unspecified severity: Secondary | ICD-10-CM

## 2019-01-15 DIAGNOSIS — S069X9S Unspecified intracranial injury with loss of consciousness of unspecified duration, sequela: Secondary | ICD-10-CM | POA: Diagnosis not present

## 2019-01-15 NOTE — Chronic Care Management (AMB) (Signed)
  Chronic Care Management   Note  01/15/2019 Name: [name] [name] MRN: [id number] DOB: 1960/04/08  Opened in Error  Cieanna Stormes RN, BSN Nurse Case Manager [facility]  [phone]) Business Mobile

## 2019-01-15 NOTE — Telephone Encounter (Addendum)
Pt calling again to ask for appt at [facility]. They have cancelled his appt, not sure why.  I do not feel comfortable rescheduling this appt.  Pt states he did not go to the ED as Dr Wynetta Emery recommended. Pt wants abx for his leg.  Pt states he cannot walk, and does not have the money to come in for appt.  Pt states he needs some help but is refusing to se dr Wynetta Emery. Please advise.

## 2019-01-15 NOTE — Telephone Encounter (Signed)
[name] Morse- FYI- I think we may need to try to get [facility] set up ASAP since [facility] cancelled his appointment.   I am happy to see him if he will see me.

## 2019-01-15 NOTE — Chronic Care Management (AMB) (Signed)
Chronic Care Management   Follow Up Note   01/15/2019 Name: [name] [name] MRN: [id number] DOB: [birth year]  Referred by: [name] Carrow, DO Reason for referral : Chronic Care Management (MD Appt )   [name] [name] is a 58 y.o. year old male who is a primary care patient of [name] Carrow, DO. The CCM team was consulted for assistance with chronic disease management and care coordination needs.    Review of patient status, including review of consultants reports, relevant laboratory and other test results, and collaboration with appropriate care team members and the patient's provider was performed as part of comprehensive patient evaluation and provision of chronic care management services.    SDOH (Social Determinants of Health) screening performed today: Psychologist, educational . See Care Plan for related entries.   Outpatient Encounter Medications as of 01/15/2019  Medication Sig  . albuterol (VENTOLIN HFA) 108 (90 Base) MCG/ACT inhaler Inhale 2 puffs into the lungs every 6 (six) hours as needed for wheezing or shortness of breath.  . allopurinol (ZYLOPRIM) 100 MG tablet TAKE 1 TABLET BY MOUTH ONCE DAILY  . amiodarone (PACERONE) 200 MG tablet Take 1 tablet (200 mg total) by mouth daily.  [name] [name] atorvastatin (LIPITOR) 40 MG tablet TAKE 1 TABLET BY MOUTH ONCE DAILY (Patient taking differently: Take 40 mg by mouth daily. )  . DULoxetine (CYMBALTA) 20 MG capsule Take 20 mg by mouth daily.  . ferrous sulfate 325 (65 FE) MG tablet Take 1 tablet (325 mg total) by mouth 2 (two) times daily with a meal.  . hydrocortisone (ANUSOL-HC) 25 MG suppository Place 1 suppository (25 mg total) rectally 2 (two) times daily.  [name] [name] levothyroxine (SYNTHROID) 50 MCG tablet Take 1 tablet (50 mcg total) by mouth daily before breakfast.  . lisinopril (ZESTRIL) 2.5 MG tablet TAKE 1 TABLET BY MOUTH ONCE DAILY  . meloxicam (MOBIC) 15 MG tablet Take 1 tablet (15 mg total) by mouth daily.  . metoprolol  succinate (TOPROL-XL) 50 MG 24 hr tablet Take 50 mg by mouth 2 (two) times daily. Take with or immediately following a meal.  . nitroGLYCERIN (NITROSTAT) 0.4 MG SL tablet Place 1 tablet (0.4 mg total) under the tongue every 5 (five) minutes as needed for chest pain.  [name] [name] nystatin-triamcinolone ointment (MYCOLOG) Apply 1 application topically 2 (two) times daily.  [name] [name] spironolactone (ALDACTONE) 25 MG tablet Take 1 tablet (25 mg total) by mouth daily.  [name] [name] torsemide (DEMADEX) 20 MG tablet Take 40 mg by mouth daily.  . traZODone (DESYREL) 150 MG tablet TAKE 1 TABLET BY MOUTH AT BEDTIME AS NEEDED SLEEP  . warfarin (COUMADIN) 5 MG tablet Take 3 tablets (15 mg total) by mouth daily.   No facility-administered encounter medications on file as of 01/15/2019.      Goals Addressed            This Visit's Progress   . RN-I need help managing my overall health (pt-stated)       Current Barriers:  . Lacks caregiver support.  . Corporate treasurer.  . Literacy barriers . Transportation barriers . Difficulty obtaining medications . Cognitive Deficits . Chronic Disease Management support and education needs related to managing multiple chronic diseases as evidenced by multiple hospitalizations in the last 6 months.  Nurse Case Manager Clinical Goal(s):  [name] [name] Over the next 90 days, patient will not experience hospital admission. Hospital Admissions in last 6 months = 5  Interventions:  . Evaluation of current treatment plan related to warfarin and patient's  adherence to plan as established by provider. Nash Dimmer with PCP regarding patient's need to be seen, PCP willing to see patient for c/o painful legs and HH nurse's recommendation for antibiotics.   . Discussed plans with patient for ongoing care management follow up and provided patient with direct contact information for care management team Will make CCM at new PCP office aware of patient's transfer of service.  . Placed a call to [facility], the fee would be 105$ per visit on weekdays and 130$ for visits on the weekends. . Placed a call to [facility] they stated they can not see patient until next week at  8am.  . Placed a call to the patient and he stated he needed to be seen asap and also the fee for [facility] was too expensive.  . Patient agreed to come in tomorrow to see Dr. Wynetta Emery about his legs. Appointment made for 8:45am. Patient verbalized appointment time.  . Will see patient with MD in the am.   Patient Self Care Activities:  . Currently UNABLE TO independently manage multiple chronic disease processes as evidenced by 5 hospital admits in the last 6 mos  Please see past updates related to this goal by clicking on the "Past Updates" button in the selected goal  Previous goal of weight loss d/c'd related to patient's new focus          The care management team will reach out to the patient again over the next 30 days.  The patient has been provided with contact information for the care management team and has been advised to call with any health related questions or concerns.   [name] Morse Mischell Branford RN, BSN Nurse Case Editor, commissioning Family Practice/[facility] Care Management  [phone]) Business Mobile

## 2019-01-15 NOTE — Progress Notes (Signed)
BP (!) 170/76    Pulse 74    Temp 99.6 F (37.6 C)    SpO2 99%    Subjective:    Patient ID: [name] [name], male    DOB: 1961-01-31, 58 y.o.   MRN: [id number]  HPI: [name] [name] is a 58 y.o. male  Chief Complaint  Patient presents with   Cellulitis   SKIN INFECTION Duration: weeks Location: bilateral calves History of trauma in area: no Pain: yes Quality: sharp and aching, "like there's acid on my skin" Severity: severe Redness: yes Swelling: yes Oozing: yes Pus: yes Fevers: no Nausea/vomiting: no Status: UNNA Boots Treatments attempted:UNNA boots Tetanus: Not UTD - Refused  Coumadin Management.  The expected duration of coumadin treatment is lifelong The reason for anticoagulation is  A Fib, Pulmonary embolism.  Present Coumadin dose: 28m daily- has only been taking 162mdaily Goal: 2.0-3.0  Excessive bruising: no Nose bleeding: no Rectal bleeding: no Prolonged menstrual cycles: N/A Eating diet with consistent amounts of foods containing Vitamin K:yes Any recent antibiotic use? no  Relevant past medical, surgical, family and social history reviewed and updated as indicated. Interim medical history since our last visit reviewed. Allergies and medications reviewed and updated.  Review of Systems  Constitutional: Negative.   Respiratory: Negative.   Cardiovascular: Positive for leg swelling. Negative for chest pain and palpitations.  Musculoskeletal: Positive for arthralgias, gait problem and myalgias. Negative for back pain, joint swelling, neck pain and neck stiffness.  Skin: Positive for color change and wound. Negative for pallor and rash.  Psychiatric/Behavioral: Negative.     Per HPI unless specifically indicated above     Objective:    BP (!) 170/76    Pulse 74    Temp 99.6 F (37.6 C)    SpO2 99%   Wt Readings from Last 3 Encounters:  12/25/18 (!) 395 lb (179.2 kg)  11/17/18 (!) 400 lb (181.4 kg)  11/01/18 (!) 405 lb (183.7 kg)      Physical Exam Vitals signs and nursing note reviewed.  Constitutional:      General: He is not in acute distress.    Appearance: Normal appearance. He is not ill-appearing, toxic-appearing or diaphoretic.  HENT:     Head: Normocephalic and atraumatic.     Right Ear: External ear normal.     Left Ear: External ear normal.     Nose: Nose normal.     Mouth/Throat:     Mouth: Mucous membranes are moist.     Pharynx: Oropharynx is clear.  Eyes:     General: No scleral icterus.       Right eye: No discharge.        Left eye: No discharge.     Extraocular Movements: Extraocular movements intact.     Conjunctiva/sclera: Conjunctivae normal.     Pupils: Pupils are equal, round, and reactive to light.  Neck:     Musculoskeletal: Normal range of motion and neck supple.  Cardiovascular:     Rate and Rhythm: Normal rate and regular rhythm.     Pulses: Normal pulses.     Heart sounds: Normal heart sounds. No murmur. No friction rub. No gallop.   Pulmonary:     Effort: Pulmonary effort is normal. No respiratory distress.     Breath sounds: Normal breath sounds. No stridor. No wheezing, rhonchi or rales.  Chest:     Chest wall: No tenderness.  Musculoskeletal: Normal range of motion.  Skin:    General: Skin is  warm and dry.     Capillary Refill: Capillary refill takes less than 2 seconds.     Coloration: Skin is not jaundiced or pale.     Findings: Erythema present. No bruising, lesion or rash.     Comments: 4 inch by 3 inch oozing wound on the anterior R shin with erythema and warmth 2 inches above and below.   6 inch by 6 inch oozing wound on L lateral calf with erythema and warmth about 3.5 inches above and below  Neurological:     General: No focal deficit present.     Mental Status: He is alert and oriented to person, place, and time. Mental status is at baseline.  Psychiatric:        Mood and Affect: Mood normal.        Behavior: Behavior normal.        Thought Content: Thought  content normal.        Judgment: Judgment normal.     Results for orders placed or performed in visit on 01/05/19  Bayer DCA Hb A1c Waived  Result Value Ref Range   HB A1C (BAYER DCA - WAIVED) 6.0 <7.0 %  CBC with Differential OUT  Result Value Ref Range   WBC 6.4 3.4 - 10.8 x10E3/uL   RBC 3.98 (L) 4.14 - 5.80 x10E6/uL   Hemoglobin 12.2 (L) 13.0 - 17.7 g/dL   Hematocrit 36.6 (L) 37.5 - 51.0 %   MCV 92 79 - 97 fL   MCH 30.7 26.6 - 33.0 pg   MCHC 33.3 31.5 - 35.7 g/dL   RDW 14.5 11.6 - 15.4 %   Platelets 286 150 - 450 x10E3/uL   Neutrophils 67 Not Estab. %   Lymphs 13 Not Estab. %   Monocytes 13 Not Estab. %   Eos 5 Not Estab. %   Basos 1 Not Estab. %   Neutrophils Absolute 4.4 1.4 - 7.0 x10E3/uL   Lymphocytes Absolute 0.9 0.7 - 3.1 x10E3/uL   Monocytes Absolute 0.9 0.1 - 0.9 x10E3/uL   EOS (ABSOLUTE) 0.3 0.0 - 0.4 x10E3/uL   Basophils Absolute 0.0 0.0 - 0.2 x10E3/uL   Immature Granulocytes 1 Not Estab. %   Immature Grans (Abs) 0.0 0.0 - 0.1 x10E3/uL  Comp Met (CMET)  Result Value Ref Range   Glucose 106 (H) 65 - 99 mg/dL   BUN 23 6 - 24 mg/dL   Creatinine, Ser 1.40 (H) 0.76 - 1.27 mg/dL   GFR calc non Af Amer 55 (L) >59 mL/min/1.73   GFR calc Af Amer 64 >59 mL/min/1.73   BUN/Creatinine Ratio 16 9 - 20   Sodium 134 134 - 144 mmol/L   Potassium 4.8 3.5 - 5.2 mmol/L   Chloride 92 (L) 96 - 106 mmol/L   CO2 28 20 - 29 mmol/L   Calcium 9.2 8.7 - 10.2 mg/dL   Total Protein 7.3 6.0 - 8.5 g/dL   Albumin 3.9 3.8 - 4.9 g/dL   Globulin, Total 3.4 1.5 - 4.5 g/dL   Albumin/Globulin Ratio 1.1 (L) 1.2 - 2.2   Bilirubin Total 0.2 0.0 - 1.2 mg/dL   Alkaline Phosphatase 93 39 - 117 IU/L   AST 16 0 - 40 IU/L   ALT 14 0 - 44 IU/L  CoaguChek XS/INR Waived  Result Value Ref Range   INR >8.0 (HH) 0.9 - 1.1   Prothrombin Time >96.0 sec  Lipid Panel w/o Chol/HDL Ratio OUT  Result Value Ref Range   Cholesterol, Total 159  100 - 199 mg/dL   Triglycerides 113 0 - 149 mg/dL   HDL 66 >39  mg/dL   VLDL Cholesterol Cal 20 5 - 40 mg/dL   LDL Chol Calc (NIH) 73 0 - 99 mg/dL  TSH  Result Value Ref Range   TSH 12.000 (H) 0.450 - 4.500 uIU/mL      Assessment & Plan:   Problem List Items Addressed This Visit      Other   Subtherapeutic international normalized ratio (INR)    INR now subtherapeutic at 1.3- held his medicine as instructed after last INR of >8 when he refused to go to the ER. Started back taking 58m daily. Has been very difficult to manage due to non-compliance with appointments. Being discharged from this office on 02/04/19. Will get him into coumadin clinic through his cardiologist. He has an appointment to see them tomorrow.        Other Visit Diagnoses    Supratherapeutic INR    -  Primary   Resolved. INR now subtherapeutic. Has been very difficult to manage due to non-compliance with appointments. Being discharged from this office. Will get him in   Relevant Orders   CoaguChek XS/INR Waived   CBC With Differential/Platelet   Venous stasis ulcer of left calf limited to breakdown of skin, unspecified whether varicose veins present ([facility]       Has been using UNNA boots through home health- have gotten worse. Will get into wound clinic. Appointment scheduled for 12/3. Continue wrapping.   Relevant Orders   Ambulatory referral to Wound Clinic   Venous stasis ulcer of other part of right lower leg limited to breakdown of skin, unspecified whether varicose veins present ([place]       Has been using UNNA boots through home health- have gotten worse. Will get into wound clinic. Appointment scheduled for 12/3. Continue wrapping.   Relevant Orders   Ambulatory referral to Wound Clinic   Cellulitis of lower extremity, unspecified laterality       Bilateral lower legs. Will treat with bactrim and refer to wound care.   Relevant Orders   Ambulatory referral to Wound Clinic       Follow up plan: Return Friday if not in with wound and coumadin clinic.

## 2019-01-16 ENCOUNTER — Other Ambulatory Visit: Payer: Self-pay

## 2019-01-16 ENCOUNTER — Ambulatory Visit: Payer: Self-pay | Admitting: *Deleted

## 2019-01-16 ENCOUNTER — Ambulatory Visit (INDEPENDENT_AMBULATORY_CARE_PROVIDER_SITE_OTHER): Payer: Medicaid Other | Admitting: Family Medicine

## 2019-01-16 ENCOUNTER — Ambulatory Visit: Payer: Self-pay | Admitting: Licensed Clinical Social Worker

## 2019-01-16 ENCOUNTER — Encounter: Payer: Self-pay | Admitting: Family Medicine

## 2019-01-16 VITALS — BP 170/76 | HR 74 | Temp 99.6°F

## 2019-01-16 DIAGNOSIS — L97221 Non-pressure chronic ulcer of left calf limited to breakdown of skin: Secondary | ICD-10-CM

## 2019-01-16 DIAGNOSIS — I83022 Varicose veins of left lower extremity with ulcer of calf: Secondary | ICD-10-CM

## 2019-01-16 DIAGNOSIS — I5022 Chronic systolic (congestive) heart failure: Secondary | ICD-10-CM

## 2019-01-16 DIAGNOSIS — R791 Abnormal coagulation profile: Secondary | ICD-10-CM | POA: Diagnosis not present

## 2019-01-16 DIAGNOSIS — I83018 Varicose veins of right lower extremity with ulcer other part of lower leg: Secondary | ICD-10-CM | POA: Diagnosis not present

## 2019-01-16 DIAGNOSIS — L03119 Cellulitis of unspecified part of limb: Secondary | ICD-10-CM | POA: Diagnosis not present

## 2019-01-16 DIAGNOSIS — G4733 Obstructive sleep apnea (adult) (pediatric): Secondary | ICD-10-CM | POA: Diagnosis not present

## 2019-01-16 DIAGNOSIS — S069X9S Unspecified intracranial injury with loss of consciousness of unspecified duration, sequela: Secondary | ICD-10-CM | POA: Diagnosis not present

## 2019-01-16 DIAGNOSIS — L97811 Non-pressure chronic ulcer of other part of right lower leg limited to breakdown of skin: Secondary | ICD-10-CM | POA: Diagnosis not present

## 2019-01-16 LAB — CBC WITH DIFFERENTIAL/PLATELET
Hematocrit: 34.5 % — ABNORMAL LOW (ref 37.5–51.0)
Hemoglobin: 11.7 g/dL — ABNORMAL LOW (ref 13.0–17.7)
Lymphocytes Absolute: 1.1 10*3/uL (ref 0.7–3.1)
Lymphs: 14 %
MCH: 31.5 pg (ref 26.6–33.0)
MCHC: 33.9 g/dL (ref 31.5–35.7)
MCV: 93 fL (ref 79–97)
MID (Absolute): 0.9 10*3/uL (ref 0.1–1.6)
MID: 12 %
Neutrophils Absolute: 5.6 10*3/uL (ref 1.4–7.0)
Neutrophils: 74 %
Platelets: 342 10*3/uL (ref 150–450)
RBC: 3.72 x10E6/uL — ABNORMAL LOW (ref 4.14–5.80)
RDW: 15.4 % (ref 11.6–15.4)
WBC: 7.6 10*3/uL (ref 3.4–10.8)

## 2019-01-16 LAB — COAGUCHEK XS/INR WAIVED
INR: 1.3 — ABNORMAL HIGH (ref 0.9–1.1)
Prothrombin Time: 15.5 s

## 2019-01-16 MED ORDER — SULFAMETHOXAZOLE-TRIMETHOPRIM 800-160 MG PO TABS: 1.0000 | ORAL_TABLET | Freq: Two times a day (BID) | ORAL | 0 refills | Status: DC

## 2019-01-16 NOTE — Patient Instructions (Addendum)
Start taking 12.5mg  of your coumadin (2.5 pills daily) You have an appointment with cardiology tomorrow  Dr. Saunders Revel [address], [place], [place] [address] Phone: [phone] 2:40PM  Wound Clinic Appointment [address], [place], [place] [address] Phone: [phone] 9:30AM  They are going to get you hooked up with their coumadin clinic. We will see you on Friday to check your legs and your INR if you are not yet established with the coumdin and wound clinic.

## 2019-01-16 NOTE — Assessment & Plan Note (Addendum)
INR now subtherapeutic at 1.3- held his medicine as instructed after last INR of >8 when he refused to go to the ER. Started back taking 10mg  daily. Has been very difficult to manage due to non-compliance with appointments. Being discharged from this office on 02/04/19. Will get him into coumadin clinic through his cardiologist. He has an appointment to see them tomorrow.

## 2019-01-16 NOTE — Chronic Care Management (AMB) (Signed)
  Care Management   Follow Up Note   01/16/2019 Name: [name] [name] MRN: [id number] DOB: [birth year]  Referred by: [name], DO Reason for referral : Care Coordination   [name] [name] is a 58 y.o. year old male who is a primary care patient of [name], DO. The care management team was consulted for assistance with care management and care coordination needs.    Review of patient status, including review of consultants reports, relevant laboratory and other test results, and collaboration with appropriate care team members and the patient's provider was performed as part of comprehensive patient evaluation and provision of chronic care management services.   LCSW received care coordination update from [place]. Patient has appointment scheduled with [facility] Dec 15 @2pm  to transfer PCP's. LCSW closed patient's current goals and un-enrolled patient from CCM program.  [name], BSW, MSW, [email or web address] Phone: [phone]

## 2019-01-16 NOTE — Chronic Care Management (AMB) (Signed)
Chronic Care Management   Follow Up Note   01/16/2019 Name: [name] [name] MRN: [id number] DOB: [birth year]  Referred by: [name], DO Reason for referral : Chronic Care Management (Pain to legs bilateral )   [name] [name] is a 58 y.o. year old male who is a primary care patient of [name], DO. The CCM team was consulted for assistance with chronic disease management and care coordination needs.    Review of patient status, including review of consultants reports, relevant laboratory and other test results, and collaboration with appropriate care team members and the patient's provider was performed as part of comprehensive patient evaluation and provision of chronic care management services.    SDOH (Social Determinants of Health) screening performed today: None. See Care Plan for related entries.   Outpatient Encounter Medications as of 01/16/2019  Medication Sig  . albuterol (VENTOLIN HFA) 108 (90 Base) MCG/ACT inhaler Inhale 2 puffs into the lungs every 6 (six) hours as needed for wheezing or shortness of breath.  . allopurinol (ZYLOPRIM) 100 MG tablet TAKE 1 TABLET BY MOUTH ONCE DAILY  . amiodarone (PACERONE) 200 MG tablet Take 1 tablet (200 mg total) by mouth daily.  [name] atorvastatin (LIPITOR) 40 MG tablet TAKE 1 TABLET BY MOUTH ONCE DAILY (Patient taking differently: Take 40 mg by mouth daily. )  . DULoxetine (CYMBALTA) 20 MG capsule Take 20 mg by mouth daily.  . ferrous sulfate 325 (65 FE) MG tablet Take 1 tablet (325 mg total) by mouth 2 (two) times daily with a meal.  . hydrocortisone (ANUSOL-HC) 25 MG suppository Place 1 suppository (25 mg total) rectally 2 (two) times daily.  [name] levothyroxine (SYNTHROID) 50 MCG tablet Take 1 tablet (50 mcg total) by mouth daily before breakfast.  . lisinopril (ZESTRIL) 2.5 MG tablet TAKE 1 TABLET BY MOUTH ONCE DAILY  . meloxicam (MOBIC) 15 MG tablet Take 1 tablet (15 mg total) by mouth daily.  . metoprolol succinate (TOPROL-XL)  50 MG 24 hr tablet Take 50 mg by mouth 2 (two) times daily. Take with or immediately following a meal.  . nitroGLYCERIN (NITROSTAT) 0.4 MG SL tablet Place 1 tablet (0.4 mg total) under the tongue every 5 (five) minutes as needed for chest pain.  [name] nystatin-triamcinolone ointment (MYCOLOG) Apply 1 application topically 2 (two) times daily.  [name] spironolactone (ALDACTONE) 25 MG tablet Take 1 tablet (25 mg total) by mouth daily.  [name] sulfamethoxazole-trimethoprim (BACTRIM DS) 800-160 MG tablet Take 1 tablet by mouth 2 (two) times daily.  [name] torsemide (DEMADEX) 20 MG tablet Take 40 mg by mouth daily.  . traZODone (DESYREL) 150 MG tablet TAKE 1 TABLET BY MOUTH AT BEDTIME AS NEEDED SLEEP  . warfarin (COUMADIN) 5 MG tablet Take 3 tablets (15 mg total) by mouth daily.   No facility-administered encounter medications on file as of 01/16/2019.      Goals Addressed            This Visit's Progress   . RN-I need help managing my overall health (pt-stated)       Current Barriers:  . Lacks caregiver support.  . Film/video editor.  . Literacy barriers . Transportation barriers . Difficulty obtaining medications . Cognitive Deficits . Chronic Disease Management support and education needs related to managing multiple chronic diseases as evidenced by multiple hospitalizations in the last 6 months.  Nurse Case Manager Clinical Goal(s):  [name] Over the next 90 days, patient will not experience hospital admission. Hospital Admissions in last 6 months =  5  Interventions:  . Evaluation of current treatment plan related to warfarin and patient's adherence to plan as established by provider. Nash Dimmer with PCP regarding patient's need to be seen, by wound clinic and coumadin clinic. Patient made aware of this by MD.   . Discussed plans with patient for ongoing care management follow up and provided patient with direct contact information for care management team  . Met with patient in person at MD office,  legs unwrapped by CMA to show wounds to both legs. Patient stating legs painful. Redness, odor and drainage noted. MD ordered antibiotic and placed referral to wound clinic. Patient has an appointment at Wound clinic Thursday @ 9:45am. Patient made aware.  . INR down to 1.9 which continues to be non therapeutic. Appointment with cardiologist @ 2:45 tomorrow who plans to refer patient to the coumadin clinic.  [name] Patient maintains he would like to continue to pursue a new PCP with [facility], but understands his care will remain through this practice for 30 days. Made patient aware I would call [facility] to request an appointment for him. Appointment scheduled with [facility] Sharyn Lull [facility] Tuesday Dec 15 @ 2pm. Called patient and made him aware of appointment.   Patient Self Care Activities:  . Currently UNABLE TO independently manage multiple chronic disease processes as evidenced by 5 hospital admits in the last 6 mos  Please see past updates related to this goal by clicking on the "Past Updates" button in the selected goal  Previous goal of weight loss d/c'd related to patient's new focus          The care management team will reach out to the patient again over the next 30 days.  The patient has been provided with contact information for the care management team and has been advised to call with any health related questions or concerns.    Merlene Morse Jillayne Witte RN, BSN Nurse Case Editor, [facility] Care Management  [phone]) Business Mobile

## 2019-01-16 NOTE — Patient Instructions (Signed)
Thank you allowing the Chronic Care Management Team to be a part of your care! It was a pleasure speaking with you today!  CCM (Chronic Care Management) Team   Jozalynn Noyce RN, BSN Nurse Care Coordinator  [phone]  [facility] PharmD  Clinical Pharmacist  [phone]  [name] LCSW Clinical Social Worker [phone]  Goals Addressed            This Visit's Progress   . RN-I need help managing my overall health (pt-stated)       Current Barriers:  . Lacks caregiver support.  . Film/video editor.  . Literacy barriers . Transportation barriers . Difficulty obtaining medications . Cognitive Deficits . Chronic Disease Management support and education needs related to managing multiple chronic diseases as evidenced by multiple hospitalizations in the last 6 months.  Nurse Case Manager Clinical Goal(s):  [name] Over the next 90 days, patient will not experience hospital admission. Hospital Admissions in last 6 months = 5  Interventions:  . Evaluation of current treatment plan related to warfarin and patient's adherence to plan as established by provider. Nash Dimmer with PCP regarding patient's need to be seen, by wound clinic and coumadin clinic. Patient made aware of this by MD.   . Discussed plans with patient for ongoing care management follow up and provided patient with direct contact information for care management team  . Met with patient in person at MD office, legs unwrapped by CMA to show wounds to both legs. Patient stating legs painful. Redness, odor and drainage noted. MD ordered antibiotic and placed referral to wound clinic. Patient has an appointment at Wound clinic Thursday @ 9:45am. Patient made aware.  . INR down to 1.9 which continues to be non therapeutic. Appointment with cardiologist @ 2:45 tomorrow who plans to refer patient to the coumadin clinic.  [name] Patient maintains he would like to continue to pursue a new PCP with [facility], but understands his care will remain through this practice for 30 days. Made patient aware I would call [facility] to request an appointment for him. Appointment scheduled with [facility] Sharyn Lull [facility] Tuesday Dec 15 @ 2pm. Called patient and made him aware of appointment.   Patient Self Care Activities:  . Currently UNABLE TO independently manage multiple chronic disease processes as evidenced by 5 hospital admits in the last 6 mos  Please see past updates related to this goal by clicking on the "Past Updates" button in the selected goal  Previous goal of weight loss d/c'd related to patient's new focus         The patient verbalized understanding of instructions provided today and declined a print copy of patient instruction materials.   The patient has been provided with contact information for the care management team and has been advised to call with any health related questions or concerns.

## 2019-01-17 ENCOUNTER — Encounter: Payer: Self-pay | Admitting: Family Medicine

## 2019-01-17 ENCOUNTER — Other Ambulatory Visit: Payer: Self-pay | Admitting: Family Medicine

## 2019-01-17 ENCOUNTER — Telehealth: Payer: Self-pay | Admitting: Internal Medicine

## 2019-01-17 ENCOUNTER — Other Ambulatory Visit: Payer: Self-pay

## 2019-01-17 ENCOUNTER — Encounter: Payer: Self-pay | Admitting: Internal Medicine

## 2019-01-17 ENCOUNTER — Ambulatory Visit (INDEPENDENT_AMBULATORY_CARE_PROVIDER_SITE_OTHER): Payer: Medicaid Other | Admitting: Internal Medicine

## 2019-01-17 VITALS — BP 147/78 | HR 85 | Ht 68.0 in | Wt 374.0 lb

## 2019-01-17 DIAGNOSIS — G4733 Obstructive sleep apnea (adult) (pediatric): Secondary | ICD-10-CM | POA: Diagnosis not present

## 2019-01-17 DIAGNOSIS — S069X9S Unspecified intracranial injury with loss of consciousness of unspecified duration, sequela: Secondary | ICD-10-CM | POA: Diagnosis not present

## 2019-01-17 DIAGNOSIS — I2699 Other pulmonary embolism without acute cor pulmonale: Secondary | ICD-10-CM

## 2019-01-17 DIAGNOSIS — I4819 Other persistent atrial fibrillation: Secondary | ICD-10-CM

## 2019-01-17 DIAGNOSIS — I5023 Acute on chronic systolic (congestive) heart failure: Secondary | ICD-10-CM | POA: Diagnosis not present

## 2019-01-17 MED ORDER — TORSEMIDE 20 MG PO TABS: 40.0000 mg | ORAL_TABLET | Freq: Two times a day (BID) | ORAL | 2 refills | Status: DC

## 2019-01-17 NOTE — Patient Instructions (Signed)
Medication Instructions:  Your physician has recommended you make the following change in your medication:  1- INCREASE Torsemide to 40 mg by mouth two times a day.  *If you need a refill on your cardiac medications before your next appointment, please call your pharmacy*  Lab Work: NONE If you have labs (blood work) drawn today and your tests are completely normal, you will receive your results only by: [name] MyChart Message (if you have MyChart) OR . A paper copy in the mail If you have any lab test that is abnormal or we need to change your treatment, we will call you to review the results.  Testing/Procedures: NONE  Follow-Up: At [facility], you and your health needs are our priority.  As part of our continuing mission to provide you with exceptional heart care, we have created designated Provider Care Teams.  These Care Teams include your primary Cardiologist (physician) and Advanced Practice Providers (APPs -  Physician Assistants and Nurse Practitioners) who all work together to provide you with the care you need, when you need it.  Your next appointment:   Next Thursday with APP around the same time as his wound care appointment.   The format for your next appointment:   In Person  Provider:    You may see Nelva Bush, MD or one of the following Advanced Practice Providers on your designated Care Team:    Murray Hodgkins, NP  Christell Faith, PA-C  Marrianne Mood, PA-C

## 2019-01-17 NOTE — Telephone Encounter (Signed)
Patient would like all his rx from today called to [facility]

## 2019-01-17 NOTE — Telephone Encounter (Signed)
No answer. Left detailed message, ok per DPR, that I did send the Torsemide to [facility].

## 2019-01-17 NOTE — Progress Notes (Signed)
Follow-up Outpatient Visit Date: 01/17/2019  Primary Care Provider: Dorcas Carrow, DO [address] [place] [address]  Chief Complaint: Leg sores  HPI:  Mr. Solazzo is a 58 y.o. male with history of chronicHFrEF due to NICM (perpatient to clean caths, most recently 1 year ago in [place]), persistent atrial fibrillation, COPD, PE, morbid obesity and obstructive sleep apnea, who presents for follow-up of heart failure and atrial fibrillation.  I last saw Mr. Carson in September, at which time he reported that his chronic shortness of breath was back to baseline.  No medication changes were made at that time.  Today, Mr. Baumgart reports severe bilateral leg pain due to worsening swelling and ulcers that began ~1 month ago.  He initially developed vesicles that subsequently burst.  He was just started on Bactrim by his PCP and is scheduled to see the wound care clinic tomorrow.  He reports being compliant with his medications, including diuretic regimen.  He believes that he has gained weight and thinks that today's weight in our office is inaccurate, as he was unable to stand on the scale well due to leg pain.  Mr. Blinder continues to note occasional migratory chest pains that are shooting and last less than a second.  Chronic shortness of breath is stable.  Mr. Trease remains on warfarin without significant bleeding.  --------------------------------------------------------------------------------------------------  Past Medical History:  Diagnosis Date  . Asthma   . Brain damage   . Chronic pain of both knees 07/13/2018  . COPD (chronic obstructive pulmonary disease) (HCC)   . Depression   . HFrEF (heart failure with reduced ejection fraction) (HCC)    a. 03/2018 Echo: EF 25-30%, diff HK. Mod dil LA.  [name] Hypertension   . Neck pain 07/13/2018  . Persistent atrial fibrillation (HCC)    a. 03/2018 s/p DCCV; b. CHA2DS2VASc = 1-->Xarelto; c. 05/2018 recurrent afib-->Amio  initiated;   . Sleep apnea    Past Surgical History:  Procedure Laterality Date  . CARDIOVERSION N/A 03/24/2018   Procedure: CARDIOVERSION;  Surgeon: Antonieta Iba, MD;  Location: [facility] ORS;  Service: Cardiovascular;  Laterality: N/A;  . CARDIOVERSION N/A 08/08/2018   Procedure: CARDIOVERSION;  Surgeon: [name], MD;  Location: [facility] ORS;  Service: Cardiovascular;  Laterality: N/A;  . hearnia repair     X 3- total of two surgeries  . LEG SURGERY      Current Meds  Medication Sig  . albuterol (VENTOLIN HFA) 108 (90 Base) MCG/ACT inhaler Inhale 2 puffs into the lungs every 6 (six) hours as needed for wheezing or shortness of breath.  . allopurinol (ZYLOPRIM) 100 MG tablet TAKE 1 TABLET BY MOUTH ONCE DAILY  . amiodarone (PACERONE) 200 MG tablet Take 1 tablet (200 mg total) by mouth daily.  [name] atorvastatin (LIPITOR) 40 MG tablet TAKE 1 TABLET BY MOUTH ONCE DAILY (Patient taking differently: Take 40 mg by mouth daily. )  . DULoxetine (CYMBALTA) 20 MG capsule Take 20 mg by mouth daily.  . ferrous sulfate 325 (65 FE) MG tablet Take 1 tablet (325 mg total) by mouth 2 (two) times daily with a meal.  . hydrocortisone (ANUSOL-HC) 25 MG suppository Place 1 suppository (25 mg total) rectally 2 (two) times daily.  [name] levothyroxine (SYNTHROID) 50 MCG tablet Take 1 tablet (50 mcg total) by mouth daily before breakfast.  . lisinopril (ZESTRIL) 2.5 MG tablet TAKE 1 TABLET BY MOUTH ONCE DAILY  . meloxicam (MOBIC) 15 MG tablet Take 1 tablet (15 mg  total) by mouth daily.  . metoprolol succinate (TOPROL-XL) 50 MG 24 hr tablet Take 50 mg by mouth 2 (two) times daily. Take with or immediately following a meal.  . nitroGLYCERIN (NITROSTAT) 0.4 MG SL tablet Place 1 tablet (0.4 mg total) under the tongue every 5 (five) minutes as needed for chest pain.  [name] nystatin-triamcinolone ointment (MYCOLOG) Apply 1 application topically 2 (two) times daily.  [name] spironolactone (ALDACTONE) 25 MG tablet Take 1 tablet (25 mg  total) by mouth daily.  [name] sulfamethoxazole-trimethoprim (BACTRIM DS) 800-160 MG tablet Take 1 tablet by mouth 2 (two) times daily.  [name] torsemide (DEMADEX) 20 MG tablet Take 40 mg by mouth daily.  . traZODone (DESYREL) 150 MG tablet TAKE 1 TABLET BY MOUTH AT BEDTIME AS NEEDED SLEEP  . warfarin (COUMADIN) 5 MG tablet Take 3 tablets (15 mg total) by mouth daily. (Patient taking differently: Take 10 mg by mouth daily. )    Allergies: Patient has no known allergies.  Social History   Tobacco Use  . Smoking status: Never Smoker  . Smokeless tobacco: Never Used  Substance Use Topics  . Alcohol use: Never    Frequency: Never  . Drug use: Never    Family History  Problem Relation Age of Onset  . Heart failure Mother   . Lung cancer Mother   . Lung cancer Father   . Heart attack Maternal Grandmother   . Heart attack Maternal Grandfather     Review of Systems: A 12-system review of systems was performed and was negative except as noted in the HPI.  --------------------------------------------------------------------------------------------------  Physical Exam: BP (!) 147/78 (BP Location: Left Arm, Patient Position: Sitting, Cuff Size: Large)   Pulse 85   Ht 5\' 8"  (1.727 m)   Wt (!) 374 lb (169.6 kg)   SpO2 96%   BMI 56.87 kg/m   General:  NAD.  Seated in a wheelchair. HEENT: No conjunctival pallor or scleral icterus. Facemask in place. Neck: Supple without lymphadenopathy or thyromegaly.  Unable to assess JVP due to body habitus. Lungs: Normal work of breathing. Clear to auscultation bilaterally without wheezes or crackles. Heart: Distant heart sounds.  RRR w/o murmurs. Abd: Bowel sounds present. Soft, NT/ND without hepatosplenomegaly Ext: 2+ pretibial edema.  Both calves are wrapped.  EKG:  NSR with LAD and LBBB.  Lab Results  Component Value Date   WBC 7.6 01/16/2019   HGB 11.7 (L) 01/16/2019   HCT 34.5 (L) 01/16/2019   MCV 93 01/16/2019   PLT 342 01/16/2019     Lab Results  Component Value Date   NA 134 01/05/2019   K 4.8 01/05/2019   CL 92 (L) 01/05/2019   CO2 28 01/05/2019   BUN 23 01/05/2019   CREATININE 1.40 (H) 01/05/2019   GLUCOSE 106 (H) 01/05/2019   ALT 14 01/05/2019    Lab Results  Component Value Date   CHOL 159 01/05/2019   HDL 66 01/05/2019   LDLCALC 73 01/05/2019   TRIG 113 01/05/2019   CHOLHDL 2.5 08/07/2018    --------------------------------------------------------------------------------------------------  ASSESSMENT AND PLAN: Acute on chronic HFrEF: Mr. Rudman appears more volume overloaded on exam today and reports stable NYHA class III HF symptoms.  We will increase torsemide to 40 mg BID.  He should continue current doses of metoprolol and lisinopril.  If BP remains at current level, we could consider increasing lisinopril at follow-up.  We will need to repeat a BMP when Mr. Toops returns for follow-up in 1 week.  Persistent  atrial fibrillation: Maintaining sinus rhythm on amiodarone and metoprolol.  No medication changes.  Continue warfarin.  Pulmonary embolism: Symptoms at baseline.  Continue indefinite anticoagulation with warfarin, given recurrent PE while on rivaroxaban.  Morbid obesity: Weight loss encouraged, though limited mobility due to acute on chronic pain is the limiting factor.  Follow-up: Return to clinic in 1 week.  [name], MD 01/17/2019 3:04 PM

## 2019-01-18 ENCOUNTER — Encounter: Payer: Medicaid Other | Attending: Physician Assistant | Admitting: Physician Assistant

## 2019-01-18 ENCOUNTER — Ambulatory Visit: Payer: Self-pay | Admitting: *Deleted

## 2019-01-18 DIAGNOSIS — I872 Venous insufficiency (chronic) (peripheral): Secondary | ICD-10-CM | POA: Insufficient documentation

## 2019-01-18 DIAGNOSIS — I890 Lymphedema, not elsewhere classified: Secondary | ICD-10-CM | POA: Insufficient documentation

## 2019-01-18 DIAGNOSIS — I480 Paroxysmal atrial fibrillation: Secondary | ICD-10-CM | POA: Insufficient documentation

## 2019-01-18 DIAGNOSIS — L03119 Cellulitis of unspecified part of limb: Secondary | ICD-10-CM

## 2019-01-18 DIAGNOSIS — G4730 Sleep apnea, unspecified: Secondary | ICD-10-CM | POA: Diagnosis not present

## 2019-01-18 DIAGNOSIS — I110 Hypertensive heart disease with heart failure: Secondary | ICD-10-CM | POA: Insufficient documentation

## 2019-01-18 DIAGNOSIS — Z86711 Personal history of pulmonary embolism: Secondary | ICD-10-CM | POA: Diagnosis not present

## 2019-01-18 DIAGNOSIS — Z6841 Body Mass Index (BMI) 40.0 and over, adult: Secondary | ICD-10-CM | POA: Diagnosis not present

## 2019-01-18 DIAGNOSIS — E11622 Type 2 diabetes mellitus with other skin ulcer: Secondary | ICD-10-CM | POA: Insufficient documentation

## 2019-01-18 DIAGNOSIS — I5042 Chronic combined systolic (congestive) and diastolic (congestive) heart failure: Secondary | ICD-10-CM | POA: Diagnosis not present

## 2019-01-18 DIAGNOSIS — J9610 Chronic respiratory failure, unspecified whether with hypoxia or hypercapnia: Secondary | ICD-10-CM | POA: Insufficient documentation

## 2019-01-18 DIAGNOSIS — L97812 Non-pressure chronic ulcer of other part of right lower leg with fat layer exposed: Secondary | ICD-10-CM | POA: Diagnosis not present

## 2019-01-18 DIAGNOSIS — Z8249 Family history of ischemic heart disease and other diseases of the circulatory system: Secondary | ICD-10-CM | POA: Insufficient documentation

## 2019-01-18 DIAGNOSIS — E669 Obesity, unspecified: Secondary | ICD-10-CM | POA: Diagnosis not present

## 2019-01-18 DIAGNOSIS — L03115 Cellulitis of right lower limb: Secondary | ICD-10-CM | POA: Insufficient documentation

## 2019-01-18 DIAGNOSIS — I878 Other specified disorders of veins: Secondary | ICD-10-CM | POA: Diagnosis not present

## 2019-01-18 DIAGNOSIS — Z7901 Long term (current) use of anticoagulants: Secondary | ICD-10-CM | POA: Diagnosis not present

## 2019-01-18 DIAGNOSIS — L97822 Non-pressure chronic ulcer of other part of left lower leg with fat layer exposed: Secondary | ICD-10-CM | POA: Diagnosis not present

## 2019-01-18 DIAGNOSIS — L03116 Cellulitis of left lower limb: Secondary | ICD-10-CM | POA: Diagnosis not present

## 2019-01-18 DIAGNOSIS — J449 Chronic obstructive pulmonary disease, unspecified: Secondary | ICD-10-CM | POA: Diagnosis not present

## 2019-01-18 DIAGNOSIS — S069X9S Unspecified intracranial injury with loss of consciousness of unspecified duration, sequela: Secondary | ICD-10-CM | POA: Diagnosis not present

## 2019-01-18 NOTE — Progress Notes (Signed)
[name] [name], [name] [name] ([id number]) Visit Report for 01/18/2019 Chief Complaint Document Details Patient Name: [name] [name], [name] [name] Date of Service: 01/18/2019 9:45 AM Medical Record Number: [id number] Patient Account Number: [id number] Date of Birth/Sex: [birth year] (58 y.o. M) Treating RN: Rodell PernaScott, Dajea Primary Care Provider: Olevia PerchesJOHNSON, MEGAN Other Clinician: Referring Provider: Olevia PerchesJOHNSON, MEGAN Treating Provider/Extender: Linwood DibblesSTONE III, Krosby Ritchie Weeks in Treatment: 0 Information Obtained from: Patient Chief Complaint Bilateral LE ulcers Electronic Signature(s) Signed: 01/18/2019 10:39:21 AM By: Lenda KelpStone III, Chereese Cilento PA-C Entered By: Lenda KelpStone III, Daziah Hesler on 01/18/2019 10:39:21 [name] [name], [name] [name] ([id number]) -------------------------------------------------------------------------------- HPI Details Patient Name: [name] [name], [name] [name] Date of Service: 01/18/2019 9:45 AM Medical Record Number: [id number] Patient Account Number: [id number] Date of Birth/Sex: [birth year] (58 y.o. M) Treating RN: Rodell PernaScott, Dajea Primary Care Provider: Olevia PerchesJOHNSON, MEGAN Other Clinician: Referring Provider: Olevia PerchesJOHNSON, MEGAN Treating Provider/Extender: Linwood DibblesSTONE III, Jonathin Heinicke Weeks in Treatment: 0 History of Present Illness HPI Description: 01/18/2019 on evaluation today patient presents for initial evaluation here in our clinic concerning issues he is been having with his bilateral lower extremities. He appears based on what I am seeing to have bilateral lower extremity cellulitis along with chronic venous stasis. He has significant pitting edema even in the bottom of his foot. With that being said he also has a history of hypertension, COPD, congestive heart failure, long-term use of anticoagulants due to atrial fibrillation which is paroxysmal as well as having had a pulmonary embolism in the past. With that being said he tells me that he has constant drainage out of his left lower extremity. This is likely due to the fact that again he has significant swelling pretty  much all the time. With that being said in the past 2 weeks this has been getting worse and he tells me at this point that he feels like it is infected is also hurting a lot more than it has been in the past and the drainage is a different color. The left side is definitely worse than the right. Fortunately there is no signs of active infection systemically although I am concerned due to the severity of the infection that I see in the left lower extremity especially. There is a lot of necrotic tissue on the surface of both wounds that at some point is going to need to be cleaned off to allow these to heal but right now obviously this is much too tender for him. No fevers, chills, nausea, vomiting, or diarrhea. Electronic Signature(s) Signed: 01/18/2019 10:57:19 AM By: Lenda KelpStone III, Asaad Gulley PA-C Entered By: Lenda KelpStone III, Shailah Gibbins on 01/18/2019 10:57:19 [name] [name], [name] [name] ([id number]) -------------------------------------------------------------------------------- Physical Exam Details Patient Name: [name] [name], [name] [name] Date of Service: 01/18/2019 9:45 AM Medical Record Number: [id number] Patient Account Number: [id number] Date of Birth/Sex: [birth year] (58 y.o. M) Treating RN: Rodell PernaScott, Dajea Primary Care Provider: Olevia PerchesJOHNSON, MEGAN Other Clinician: Referring Provider: Olevia PerchesJOHNSON, MEGAN Treating Provider/Extender: Linwood DibblesSTONE III, Narda Fundora Weeks in Treatment: 0 Constitutional patient is hypertensive.. pulse regular and within target range for patient.[name]. respirations regular, non-labored and within target range for patient.[name]. temperature within target range for patient.. Well-nourished and well-hydrated in no acute distress. Eyes conjunctiva clear no eyelid edema noted. pupils equal round and reactive to light and accommodation. Ears, Nose, Mouth, and Throat no gross abnormality of ear auricles or external auditory canals. normal hearing noted during conversation. mucus membranes moist. Respiratory normal breathing without  difficulty. clear to auscultation bilaterally. Cardiovascular regular rate and rhythm with normal S1, S2. 2+ dorsalis pedis/posterior tibialis pulses. 3+ pitting edema of the bilateral lower extremities. Gastrointestinal (GI) soft, non-tender, non-distended, +BS. no ventral  hernia noted. Musculoskeletal Patient unable to walk without assistance. no significant deformity or arthritic changes, no loss or range of motion, no clubbing. Psychiatric this patient is able to make decisions and demonstrates good insight into disease process. Alert and Oriented x 3. pleasant and cooperative. Notes Patient's wound bed currently showed signs of necrotic tissue buildup on the surface of the wound unfortunately which seems to be showing evidence of overall poor healing at this point. Again I think a lot of this is secondary to his excessive edema he even has pitting edema on the bottom of his foot. The edema is at least 2-3+. With regard to his wounds he really does need some sharp debridement but at this point he is not able to have debridement secondary to the pain that he is experiencing. He has a difficult time walking he gets very short of breath when walking again I attribute this to his excessive COPD and congestive heart failure. Electronic Signature(s) Signed: 01/18/2019 10:58:27 AM By: Lenda Kelp PA-C Entered By: Lenda Kelp on 01/18/2019 10:58:27 [name] [name], [name] [name] ([id number]) -------------------------------------------------------------------------------- Physician Orders Details Patient Name: [name] Majestic Date of Service: 01/18/2019 9:45 AM Medical Record Number: [id number] Patient Account Number: [id number] Date of Birth/Sex: [birth year] (58 y.o. M) Treating RN: Rodell Perna Primary Care Provider: Olevia Perches Other Clinician: Referring Provider: Olevia Perches Treating Provider/Extender: Linwood Dibbles, Tameaka Eichhorn Weeks in Treatment: 0 Verbal / Phone Orders: No Diagnosis  Coding ICD-10 Coding Code Description I87.2 Venous insufficiency (chronic) (peripheral) L97.822 Non-pressure chronic ulcer of other part of left lower leg with fat layer exposed L97.812 Non-pressure chronic ulcer of other part of right lower leg with fat layer exposed I10 Essential (primary) hypertension J44.9 Chronic obstructive pulmonary disease, unspecified Z79.01 Long term (current) use of anticoagulants I48.0 Paroxysmal atrial fibrillation Z86.711 Personal history of pulmonary embolism Wound Cleansing Wound #1 Right,Lateral Lower Leg o Clean wound with Normal Saline. o Cleanse wound with mild soap and water Wound #2 Left,Lateral Lower Leg o Clean wound with Normal Saline. o Cleanse wound with mild soap and water Primary Wound Dressing Wound #1 Right,Lateral Lower Leg o Silver Alginate Wound #2 Left,Lateral Lower Leg o Silver Alginate Secondary Dressing Wound #1 Right,Lateral Lower Leg o XtraSorb Wound #2 Left,Lateral Lower Leg o XtraSorb Dressing Change Frequency Wound #1 Right,Lateral Lower Leg o Change Dressing Monday, Wednesday, Friday - Friday in office, Monday and Wednesday by [facility] Wound #2 Left,Lateral Lower Leg o Change Dressing Monday, Wednesday, Friday - Friday in office, Monday and Wednesday by [facility] [place], [place] ([id number]) Follow-up Appointments Wound #1 Right,Lateral Lower Leg o Return Appointment in 1 week. Wound #2 Left,Lateral Lower Leg o Return Appointment in 1 week. Edema Control Wound #1 Right,Lateral Lower Leg o Unna Boots Bilaterally o Elevate legs to the level of the heart and pump ankles as often as possible Wound #2 Left,Lateral Lower Leg o Unna Boots Bilaterally o Elevate legs to the level of the heart and pump ankles as often as possible Home Health Wound #1 Right,Lateral Lower Leg o [facility] Vis[facility] - [facility] o Home Health Nurse may visit PRN to address patientos wound care needs. o FACE  TO FACE ENCOUNTER: MEDICARE and MEDICAID PATIENTS: I certify that this patient is under my care and that I had a face-to-face encounter that meets the physician face-to-face encounter requirements with this patient on this date. The encounter with the patient was in whole or in part for the following MEDICAL CONDITION: (primary reason for Home Healthcare) MEDICAL NECESSITY: I  certify, that based on my findings, NURSING services are a medically necessary home health service. HOME BOUND STATUS: I certify that my clinical findings support that this patient is homebound (i.e., Due to illness or injury, pt requires aid of supportive devices such as crutches, cane, wheelchairs, walkers, the use of special transportation or the assistance of another person to leave their place of residence. There is a normal inability to leave the home and doing so requires considerable and taxing effort. Other absences are for medical reasons / religious services and are infrequent or of short duration when for other reasons). o If current dressing causes regression in wound condition, may D/C ordered dressing product/s and apply Normal Saline Moist Dressing daily until next Wound Healing Center / Other MD appointment. Notify Wound Healing Center of regression in wound condition at [phone]. o Please direct any NON-WOUND related issues/requests for orders to patient's Primary Care Physician Wound #2 Left,Lateral Lower Leg o [facility] - [facility] o Home Health Nurse may visit PRN to address patientos wound care needs. o FACE TO FACE ENCOUNTER: MEDICARE and MEDICAID PATIENTS: I certify that this patient is under my care and that I had a face-to-face encounter that meets the physician face-to-face encounter requirements with this patient on this date. The encounter with the patient was in whole or in part for the following MEDICAL CONDITION: (primary reason for Home Healthcare) MEDICAL  NECESSITY: I certify, that based on my findings, NURSING services are a medically necessary home health service. HOME BOUND STATUS: I certify that my clinical findings support that this patient is homebound (i.e., Due to illness or injury, pt requires aid of supportive devices such as crutches, cane, wheelchairs, walkers, the use of special transportation or the assistance of another person to leave their place of residence. There is a normal inability to leave the home and doing so requires considerable and taxing effort. Other absences are for medical reasons / religious services and are infrequent or of short duration when for other reasons). o If current dressing causes regression in wound condition, may D/C ordered dressing product/s and apply Normal Saline Moist Dressing daily until next Wound Healing Center / Other MD appointment. Notify Wound Healing Center of regression in wound condition at [phone]. o Please direct any NON-WOUND related issues/requests for orders to patient's Primary Care Physician Consults o Vascular - ABIs and TBIs [name] [name], Stefano ([id number]) Laboratory o Bacteria identified in Wound by Culture (MICRO) - Left leg oooo LOINC Code: 6462-6 oooo Convenience Name: Wound culture routine Patient Medications Allergies: No Known Drug Allergies Notifications Medication Indication Start End Levaquin 01/18/2019 DOSE 1 - oral 750 mg tablet - 1 tablet oral taken 2 times a day for 14 days Electronic Signature(s) Signed: 01/18/2019 11:00:34 AM By: Lenda KelpStone III, Hanan Mcwilliams PA-C Entered By: Lenda KelpStone III, Madix Blowe on 01/18/2019 11:00:34 [name] [name], [name] [name] ([id number]) -------------------------------------------------------------------------------- Problem List Details Patient Name: [name] [name], Kayo Date of Service: 01/18/2019 9:45 AM Medical Record Number: [id number] Patient Account Number: [id number] Date of Birth/Sex: Aug 05, 1960 (58 y.o. M) Treating RN: Rodell PernaScott, Dajea Primary Care  Provider: Olevia PerchesJOHNSON, MEGAN Other Clinician: Referring Provider: Olevia PerchesJOHNSON, MEGAN Treating Provider/Extender: Linwood DibblesSTONE III, Joeli Fenner Weeks in Treatment: 0 Active Problems ICD-10 Evaluated Encounter Code Description Active Date Today Diagnosis I87.2 Venous insufficiency (chronic) (peripheral) 01/18/2019 No Yes L97.822 Non-pressure chronic ulcer of other part of left lower leg with 01/18/2019 No Yes fat layer exposed L97.812 Non-pressure chronic ulcer of other part of right lower leg 01/18/2019 No Yes with fat layer exposed I10 Essential (  primary) hypertension 01/18/2019 No Yes J44.9 Chronic obstructive pulmonary disease, unspecified 01/18/2019 No Yes Z79.01 Long term (current) use of anticoagulants 01/18/2019 No Yes I48.0 Paroxysmal atrial fibrillation 01/18/2019 No Yes Z86.711 Personal history of pulmonary embolism 01/18/2019 No Yes I50.42 Chronic combined systolic (congestive) and diastolic 01/18/2019 No Yes (congestive) heart failure Inactive Problems [name] [name], [name] [name] ([id number]) Resolved Problems Electronic Signature(s) Signed: 01/18/2019 10:55:30 AM By: Lenda Kelp PA-C Previous Signature: 01/18/2019 10:38:56 AM Version By: Lenda Kelp PA-C Entered By: Lenda Kelp on 01/18/2019 10:55:30 [name] Majestic ([id number]) -------------------------------------------------------------------------------- Progress Note Details Patient Name: [name] Majestic Date of Service: 01/18/2019 9:45 AM Medical Record Number: [id number] Patient Account Number: [id number] Date of Birth/Sex: [birth year] (58 y.o. M) Treating RN: Rodell Perna Primary Care Provider: Olevia Perches Other Clinician: Referring Provider: Olevia Perches Treating Provider/Extender: Linwood Dibbles, Ebunoluwa Gernert Weeks in Treatment: 0 Subjective Chief Complaint Information obtained from Patient Bilateral LE ulcers History of Present Illness (HPI) 01/18/2019 on evaluation today patient presents for initial evaluation here in our clinic concerning  issues he is been having with his bilateral lower extremities. He appears based on what I am seeing to have bilateral lower extremity cellulitis along with chronic venous stasis. He has significant pitting edema even in the bottom of his foot. With that being said he also has a history of hypertension, COPD, congestive heart failure, long-term use of anticoagulants due to atrial fibrillation which is paroxysmal as well as having had a pulmonary embolism in the past. With that being said he tells me that he has constant drainage out of his left lower extremity. This is likely due to the fact that again he has significant swelling pretty much all the time. With that being said in the past 2 weeks this has been getting worse and he tells me at this point that he feels like it is infected is also hurting a lot more than it has been in the past and the drainage is a different color. The left side is definitely worse than the right. Fortunately there is no signs of active infection systemically although I am concerned due to the severity of the infection that I see in the left lower extremity especially. There is a lot of necrotic tissue on the surface of both wounds that at some point is going to need to be cleaned off to allow these to heal but right now obviously this is much too tender for him. No fevers, chills, nausea, vomiting, or diarrhea. Patient History Information obtained from Patient. Allergies No Known Drug Allergies Family History Cancer - Father,Mother, Heart Disease - Mother,Maternal Grandparents, Hypertension - Maternal Grandparents,Mother, No family history of Diabetes, Hereditary Spherocytosis, Kidney Disease, Lung Disease, Seizures, Stroke, Thyroid Problems, Tuberculosis. Social History Never smoker, Marital Status - Widowed, Alcohol Use - Rarely, Drug Use - No History, Caffeine Use - Daily. Medical History Respiratory Patient has history of Asthma, Chronic Obstructive  Pulmonary Disease (COPD), Sleep Apnea Denies history of Aspiration, Pneumothorax, Tuberculosis Cardiovascular Patient has history of Arrhythmia - a fib, Congestive Heart Failure, Hypertension Denies history of Angina, Coronary Artery Disease, Deep Vein Thrombosis, Hypotension, Myocardial Infarction, Peripheral Arterial Disease, Peripheral Venous Disease, Phlebitis, Vasculitis Endocrine Patient has history of Type II Diabetes Denies history of Type I Diabetes Integumentary (Skin) PARKER, WHERLEY ([id number]) Denies history of History of Burn, History of pressure wounds Musculoskeletal Denies history of Gout, Rheumatoid Arthritis, Osteoarthritis, Osteomyelitis Neurologic Denies history of Dementia, Neuropathy, Quadriplegia, Paraplegia, Seizure Disorder Patient is treated with Controlled Diet. Medical And Surgical History  Notes Respiratory chronic resp failure, PE Musculoskeletal back pain Neurologic TBI Review of Systems (ROS) Constitutional Symptoms (General Health) Denies complaints or symptoms of Fatigue, Fever, Chills, Marked Weight Change. Eyes Denies complaints or symptoms of Dry Eyes, Vision Changes, Glasses / Contacts. Ear/Nose/Mouth/Throat Denies complaints or symptoms of Difficult clearing ears, Sinusitis. Hematologic/Lymphatic Denies complaints or symptoms of Bleeding / Clotting Disorders, Human Immunodeficiency Virus. Respiratory Denies complaints or symptoms of Chronic or frequent coughs, Shortness of Breath. Cardiovascular Complains or has symptoms of LE edema. Denies complaints or symptoms of Chest pain. Gastrointestinal Denies complaints or symptoms of Frequent diarrhea, Nausea, Vomiting. Endocrine Denies complaints or symptoms of Hepatitis, Thyroid disease, Polydypsia (Excessive Thirst). Genitourinary Denies complaints or symptoms of Kidney failure/ Dialysis, Incontinence/dribbling. Immunological Denies complaints or symptoms of Hives,  Itching. Integumentary (Skin) Complains or has symptoms of Wounds, Swelling. Denies complaints or symptoms of Bleeding or bruising tendency, Breakdown. Musculoskeletal Denies complaints or symptoms of Muscle Pain, Muscle Weakness. Neurologic Denies complaints or symptoms of Numbness/parasthesias, Focal/Weakness. Psychiatric Denies complaints or symptoms of Anxiety, Claustrophobia. Objective Constitutional ARIF, AMENDOLA ([id number]) patient is hypertensive.. pulse regular and within target range for patient.[name] respirations regular, non-labored and within target range for patient.[name] temperature within target range for patient.. Well-nourished and well-hydrated in no acute distress. Vitals Time Taken: 10:00 AM, Height: 68 in, Source: Measured, Weight: 400 lbs, Source: Measured, BMI: 60.8, Temperature: 100.1 F, Pulse: 76 bpm, Respiratory Rate: 22 breaths/min, Blood Pressure: 144/66 mmHg. Eyes conjunctiva clear no eyelid edema noted. pupils equal round and reactive to light and accommodation. Ears, Nose, Mouth, and Throat no gross abnormality of ear auricles or external auditory canals. normal hearing noted during conversation. mucus membranes moist. Respiratory normal breathing without difficulty. clear to auscultation bilaterally. Cardiovascular regular rate and rhythm with normal S1, S2. 2+ dorsalis pedis/posterior tibialis pulses. 3+ pitting edema of the bilateral lower extremities. Gastrointestinal (GI) soft, non-tender, non-distended, +BS. no ventral hernia noted. Musculoskeletal Patient unable to walk without assistance. no significant deformity or arthritic changes, no loss or range of motion, no clubbing. Psychiatric this patient is able to make decisions and demonstrates good insight into disease process. Alert and Oriented x 3. pleasant and cooperative. General Notes: Patient's wound bed currently showed signs of necrotic tissue buildup on the surface of the  wound unfortunately which seems to be showing evidence of overall poor healing at this point. Again I think a lot of this is secondary to his excessive edema he even has pitting edema on the bottom of his foot. The edema is at least 2-3+. With regard to his wounds he really does need some sharp debridement but at this point he is not able to have debridement secondary to the pain that he is experiencing. He has a difficult time walking he gets very short of breath when walking again I attribute this to his excessive COPD and congestive heart failure. Integumentary (Hair, Skin) Wound #1 status is Open. Original cause of wound was Gradually Appeared. The wound is located on the Right,Lateral Lower Leg. The wound measures 8cm length x 13cm width x 0.1cm depth; 81.681cm^2 area and 8.168cm^3 volume. There is Fat Layer (Subcutaneous Tissue) Exposed exposed. There is no tunneling or undermining noted. There is a large amount of serous drainage noted. The wound margin is indistinct and nonvisible. There is medium (34-66%) pink granulation within the wound bed. There is a medium (34-66%) amount of necrotic tissue within the wound bed including Adherent Slough. Wound #2 status is Open. Original cause of wound was  Gradually Appeared. The wound is located on the Left,Lateral Lower Leg. The wound measures 15cm length x 23cm width x 0.1cm depth; 270.962cm^2 area and 27.096cm^3 volume. There is Fat Layer (Subcutaneous Tissue) Exposed exposed. There is no tunneling or undermining noted. There is a large amount of serous drainage noted. The wound margin is indistinct and nonvisible. There is small (1-33%) pink granulation within the wound bed. There is a large (67-100%) amount of necrotic tissue within the wound bed including Adherent Slough. Assessment [name] [name], ARCHIE ([id number]) Active Problems ICD-10 Venous insufficiency (chronic) (peripheral) Non-pressure chronic ulcer of other part of left lower leg with  fat layer exposed Non-pressure chronic ulcer of other part of right lower leg with fat layer exposed Essential (primary) hypertension Chronic obstructive pulmonary disease, unspecified Long term (current) use of anticoagulants Paroxysmal atrial fibrillation Personal history of pulmonary embolism Chronic combined systolic (congestive) and diastolic (congestive) heart failure Procedures Wound #1 Pre-procedure diagnosis of Wound #1 is a Venous Leg Ulcer located on the Right,Lateral Lower Leg . There was a Radio broadcast assistant Compression Therapy Procedure by Rodell Perna, RN. Post procedure Diagnosis Wound #1: Same as Pre-Procedure Wound #2 Pre-procedure diagnosis of Wound #2 is a Venous Leg Ulcer located on the Left,Lateral Lower Leg . There was a Radio broadcast assistant Compression Therapy Procedure by Rodell Perna, RN. Post procedure Diagnosis Wound #2: Same as Pre-Procedure Plan Wound Cleansing: Wound #1 Right,Lateral Lower Leg: Clean wound with Normal Saline. Cleanse wound with mild soap and water Wound #2 Left,Lateral Lower Leg: Clean wound with Normal Saline. Cleanse wound with mild soap and water Primary Wound Dressing: Wound #1 Right,Lateral Lower Leg: Silver Alginate Wound #2 Left,Lateral Lower Leg: Silver Alginate Secondary Dressing: Wound #1 Right,Lateral Lower Leg: XtraSorb Wound #2 Left,Lateral Lower Leg: XtraSorb Dressing Change Frequency: Wound #1 Right,Lateral Lower Leg: Change Dressing Monday, Wednesday, Friday - Friday in office, Monday and Wednesday by [facility] [place], [place] ([id number]) Wound #2 Left,Lateral Lower Leg: Change Dressing Monday, Wednesday, Friday - Friday in office, Monday and Wednesday by [facility] Follow-up Appointments: Wound #1 Right,Lateral Lower Leg: Return Appointment in 1 week. Wound #2 Left,Lateral Lower Leg: Return Appointment in 1 week. Edema Control: Wound #1 Right,Lateral Lower Leg: Unna Boots Bilaterally Elevate legs to the level of the heart and pump ankles  as often as possible Wound #2 Left,Lateral Lower Leg: Unna Boots Bilaterally Elevate legs to the level of the heart and pump ankles as often as possible Home Health: Wound #1 Right,Lateral Lower Leg: Continue Home Health Visits - [facility] Nurse may visit PRN to address patient s wound care needs. FACE TO FACE ENCOUNTER: MEDICARE and MEDICAID PATIENTS: I certify that this patient is under my care and that I had a face-to-face encounter that meets the physician face-to-face encounter requirements with this patient on this date. The encounter with the patient was in whole or in part for the following MEDICAL CONDITION: (primary reason for Home Healthcare) MEDICAL NECESSITY: I certify, that based on my findings, NURSING services are a medically necessary home health service. HOME BOUND STATUS: I certify that my clinical findings support that this patient is homebound (i.e., Due to illness or injury, pt requires aid of supportive devices such as crutches, cane, wheelchairs, walkers, the use of special transportation or the assistance of another person to leave their place of residence. There is a normal inability to leave the home and doing so requires considerable and taxing effort. Other absences are for medical reasons / religious services and are infrequent or of  short duration when for other reasons). If current dressing causes regression in wound condition, may D/C ordered dressing product/s and apply Normal Saline Moist Dressing daily until next [place] / Other MD appointment. [place] of regression in wound condition at [phone]. Please direct any NON-WOUND related issues/requests for orders to patient's Primary Care Physician Wound #2 Left,Lateral Lower Leg: [place] Visits - [place] Nurse may visit PRN to address patient s wound care needs. FACE TO FACE ENCOUNTER: MEDICARE and MEDICAID PATIENTS: I certify that this  patient is under my care and that I had a face-to-face encounter that meets the physician face-to-face encounter requirements with this patient on this date. The encounter with the patient was in whole or in part for the following MEDICAL CONDITION: (primary reason for [place]) MEDICAL NECESSITY: I certify, that based on my findings, NURSING services are a medically necessary home health service. HOME BOUND STATUS: I certify that my clinical findings support that this patient is homebound (i.e., Due to illness or injury, pt requires aid of supportive devices such as crutches, cane, wheelchairs, walkers, the use of special transportation or the assistance of another person to leave their place of residence. There is a normal inability to leave the home and doing so requires considerable and taxing effort. Other absences are for medical reasons / religious services and are infrequent or of short duration when for other reasons). If current dressing causes regression in wound condition, may D/C ordered dressing product/s and apply Normal Saline Moist Dressing daily until next [place] / Other MD appointment. [facility] of regression in wound condition at [phone]. Please direct any NON-WOUND related issues/requests for orders to patient's Primary Care Physician Laboratory ordered were: Wound culture routine - Left leg Consults ordered were: Vascular - ABIs and TBIs The following medication(s) was prescribed: Levaquin oral 750 mg tablet 1 1 tablet oral taken 2 times a day for 14 days starting 01/18/2019 1. My suggestion at this time based on what I am seeing is good to be that we go ahead and initiate treatment with a silver alginate dressing followed by Lauraine Rinne at this point. We are also going to use a an Pension scheme manager which she has been TIGRAN, HAYNIE ([id number]) tolerating without complication home health has been applying this up to this time. 2. I  also did obtain a wound culture today and I placed patient on Levaquin which I feel like this is going to be a good option as far as his cellulitis is concerned at this point. Hopefully this will help to calm down some of the inflammation and pain for that matter. 3. I recommend is much as possible he needs to elevate his legs to help with edema control. Obviously he can move around and walk but when he sitting he needs to try to elevate as much as he can. 4. I discussed with the patient that at this point I have a very low threshold for sending him to the hospital based on everything I am seeing. If he gets any worse from the standpoint of edema, the cellulitis in particular, or develops any fevers, chills, nausea, vomiting, or diarrhea he needs to go to the hospital soon as possible and this was discussed with him today. He is in agreement with that. 5. I am also going to go ahead and order arterial studies with ABIs and TBI's. We will see patient back for reevaluation in 1 week  here in the clinic. If anything worsens or changes patient will contact our office for additional recommendations. Electronic Signature(s) Signed: 01/18/2019 11:01:22 AM By: Lenda Kelp PA-C Entered By: Lenda Kelp on 01/18/2019 11:01:21 DEAVIN, FORST ([id number]) -------------------------------------------------------------------------------- ROS/PFSH Details Patient Name: [name] Majestic Date of Service: 01/18/2019 9:45 AM Medical Record Number: [id number] Patient Account Number: [id number] Date of Birth/Sex: 14-Jun-1960 (58 y.o. M) Treating RN: Curtis Sites Primary Care Provider: Olevia Perches Other Clinician: Referring Provider: Olevia Perches Treating Provider/Extender: Linwood Dibbles, Jaydee Ingman Weeks in Treatment: 0 Information Obtained From Patient Constitutional Symptoms (General Health) Complaints and Symptoms: Negative for: Fatigue; Fever; Chills; Marked Weight Change Eyes Complaints and  Symptoms: Negative for: Dry Eyes; Vision Changes; Glasses / Contacts Ear/Nose/Mouth/Throat Complaints and Symptoms: Negative for: Difficult clearing ears; Sinusitis Hematologic/Lymphatic Complaints and Symptoms: Negative for: Bleeding / Clotting Disorders; Human Immunodeficiency Virus Respiratory Complaints and Symptoms: Negative for: Chronic or frequent coughs; Shortness of Breath Medical History: Positive for: Asthma; Chronic Obstructive Pulmonary Disease (COPD); Sleep Apnea Negative for: Aspiration; Pneumothorax; Tuberculosis Past Medical History Notes: chronic resp failure, PE Cardiovascular Complaints and Symptoms: Positive for: LE edema Negative for: Chest pain Medical History: Positive for: Arrhythmia - a fib; Congestive Heart Failure; Hypertension Negative for: Angina; Coronary Artery Disease; Deep Vein Thrombosis; Hypotension; Myocardial Infarction; Peripheral Arterial Disease; Peripheral Venous Disease; Phlebitis; Vasculitis Gastrointestinal Complaints and Symptoms: Negative for: Frequent diarrhea; Nausea; Vomiting Vasko, Jahn ([id number]) Endocrine Complaints and Symptoms: Negative for: Hepatitis; Thyroid disease; Polydypsia (Excessive Thirst) Medical History: Positive for: Type II Diabetes Negative for: Type I Diabetes Treated with: Diet Genitourinary Complaints and Symptoms: Negative for: Kidney failure/ Dialysis; Incontinence/dribbling Immunological Complaints and Symptoms: Negative for: Hives; Itching Integumentary (Skin) Complaints and Symptoms: Positive for: Wounds; Swelling Negative for: Bleeding or bruising tendency; Breakdown Medical History: Negative for: History of Burn; History of pressure wounds Musculoskeletal Complaints and Symptoms: Negative for: Muscle Pain; Muscle Weakness Medical History: Negative for: Gout; Rheumatoid Arthritis; Osteoarthritis; Osteomyelitis Past Medical History Notes: back pain Neurologic Complaints and  Symptoms: Negative for: Numbness/parasthesias; Focal/Weakness Medical History: Negative for: Dementia; Neuropathy; Quadriplegia; Paraplegia; Seizure Disorder Past Medical History Notes: TBI Psychiatric Complaints and Symptoms: Negative for: Anxiety; Claustrophobia Oncologic Immunizations Pneumococcal Vaccine: Received Pneumococcal Vaccination: No LERRY, CORDREY ([id number]) Implantable Devices None Family and Social History Cancer: Yes - Father,Mother; Diabetes: No; Heart Disease: Yes - Mother,Maternal Grandparents; Hereditary Spherocytosis: No; Hypertension: Yes - Maternal Grandparents,Mother; Kidney Disease: No; Lung Disease: No; Seizures: No; Stroke: No; Thyroid Problems: No; Tuberculosis: No; Never smoker; Marital Status - Widowed; Alcohol Use: Rarely; Drug Use: No History; Caffeine Use: Daily; Financial Concerns: No; Food, Clothing or Shelter Needs: No; Support System Lacking: No; Transportation Concerns: No Electronic Signature(s) Signed: 01/18/2019 5:00:08 PM By: Curtis Sites Signed: 01/18/2019 5:16:54 PM By: Lenda Kelp PA-C Entered By: Curtis Sites on 01/18/2019 10:09:35 [name] Majestic ([id number]) -------------------------------------------------------------------------------- SuperBill Details Patient Name: [name] Majestic Date of Service: 01/18/2019 Medical Record Number: [id number] Patient Account Number: [id number] Date of Birth/Sex: 10/13/1960 (58 y.o. M) Treating RN: Rodell Perna Primary Care Provider: Olevia Perches Other Clinician: Referring Provider: Olevia Perches Treating Provider/Extender: Linwood Dibbles, Setareh Rom Weeks in Treatment: 0 Diagnosis Coding ICD-10 Codes Code Description I87.2 Venous insufficiency (chronic) (peripheral) L97.822 Non-pressure chronic ulcer of other part of left lower leg with fat layer exposed L97.812 Non-pressure chronic ulcer of other part of right lower leg with fat layer exposed I10 Essential (primary) hypertension J44.9  Chronic obstructive pulmonary disease, unspecified Z79.01 Long term (current) use of anticoagulants I48.0 Paroxysmal atrial fibrillation Z86.711 Personal history  of pulmonary embolism I50.42 Chronic combined systolic (congestive) and diastolic (congestive) heart failure Facility Procedures CPT4 Code: 16109604 Description: 99213 - WOUND CARE VISIT-LEV 3 EST PT Modifier: Quantity: 1 Physician Procedures CPT4 Code Description: 5409811 91478 - WC PHYS LEVEL 4 - NEW PT ICD-10 Diagnosis Description I87.2 Venous insufficiency (chronic) (peripheral) L97.822 Non-pressure chronic ulcer of other part of left lower leg wit L97.812 Non-pressure chronic ulcer of  other part of right lower leg wi I10 Essential (primary) hypertension Modifier: 25 h fat layer expos th fat layer expo Quantity: 1 ed sed Electronic Signature(s) Signed: 01/18/2019 11:02:57 AM By: Lenda Kelp PA-C Entered By: Lenda Kelp on 01/18/2019 11:02:57

## 2019-01-18 NOTE — Progress Notes (Signed)
JERMEY, CLOSS ([id number]) Visit Report for 01/18/2019 Abuse/Suicide Risk Screen Details Patient Name: OLUWATOBI, VISSER Date of Service: 01/18/2019 9:45 AM Medical Record Number: [id number] Patient Account Number: [id number] Date of Birth/Sex: Nov 22, 1960 (58 y.o. M) Treating RN: Curtis Sites Primary Care Kyndall Chaplin: Olevia Perches Other Clinician: Referring Bueford Arp: Olevia Perches Treating Tisa Weisel/Extender: Linwood Dibbles, HOYT Weeks in Treatment: 0 Abuse/Suicide Risk Screen Items Answer ABUSE RISK SCREEN: Has anyone close to you tried to hurt or harm you recentlyo No Do you feel uncomfortable with anyone in your familyo No Has anyone forced you do things that you didnot want to doo No Electronic Signature(s) Signed: 01/18/2019 5:00:08 PM By: Curtis Sites Entered By: Curtis Sites on 01/18/2019 10:02:40 [name] ([id number]) -------------------------------------------------------------------------------- Activities of Daily Living Details Patient Name: [name] Date of Service: 01/18/2019 9:45 AM Medical Record Number: [id number] Patient Account Number: [id number] Date of Birth/Sex: [birth year] (58 y.o. M) Treating RN: Curtis Sites Primary Care Thena Devora: Olevia Perches Other Clinician: Referring Nakeesha Bowler: Olevia Perches Treating Lavalle Skoda/Extender: Linwood Dibbles, HOYT Weeks in Treatment: 0 Activities of Daily Living Items Answer Activities of Daily Living (Please select one for each item) Drive Automobile Completely Able Take Medications Completely Able Use Telephone Completely Able Care for Appearance Completely Able Use Toilet Completely Able Bath / Shower Completely Able Dress Self Completely Able Feed Self Completely Able Walk Completely Able Get In / Out Bed Completely Able Housework Need Assistance Prepare Meals Need Assistance Handle Money Completely Able Shop for Self Need Assistance Electronic Signature(s) Signed: 01/18/2019 5:00:08 PM By: Curtis Sites Entered By: Curtis Sites on 01/18/2019 10:03:05 [name] ([id number]) -------------------------------------------------------------------------------- Education Screening Details Patient Name: [name] Date of Service: 01/18/2019 9:45 AM Medical Record Number: [id number] Patient Account Number: [id number] Date of Birth/Sex: August 14, 1960 (58 y.o. M) Treating RN: Curtis Sites Primary Care Tron Flythe: Olevia Perches Other Clinician: Referring Myrakle Wingler: Olevia Perches Treating Abriella Filkins/Extender: Linwood Dibbles, HOYT Weeks in Treatment: 0 Primary Learner Assessed: Patient Learning Preferences/Education Level/Primary Language Learning Preference: Explanation, Demonstration Highest Education Level: High School Preferred Language: English Cognitive Barrier Language Barrier: No Translator Needed: No Memory Deficit: No Emotional Barrier: No Cultural/Religious Beliefs Affecting Medical Care: No Physical Barrier Impaired Vision: No Impaired Hearing: No Decreased Hand dexterity: No Knowledge/Comprehension Knowledge Level: Medium Comprehension Level: Medium Ability to understand written Medium instructions: Ability to understand verbal Medium instructions: Motivation Anxiety Level: Calm Cooperation: Cooperative Education Importance: Acknowledges Need Interest in Health Problems: Asks Questions Perception: Coherent Willingness to Engage in Self- Medium Management Activities: Readiness to Engage in Self- Medium Management Activities: Electronic Signature(s) Signed: 01/18/2019 5:00:08 PM By: Curtis Sites Entered By: Curtis Sites on 01/18/2019 10:04:00 [name] ([id number]) -------------------------------------------------------------------------------- Fall Risk Assessment Details Patient Name: [name] Date of Service: 01/18/2019 9:45 AM Medical Record Number: [id number] Patient Account Number: [id number] Date of Birth/Sex: 1960/10/22 (58 y.o.  M) Treating RN: Curtis Sites Primary Care Natasha Burda: Olevia Perches Other Clinician: Referring Bernis Schreur: Olevia Perches Treating Meagan Ancona/Extender: Linwood Dibbles, HOYT Weeks in Treatment: 0 Fall Risk Assessment Items Have you had 2 or more falls in the last 12 monthso 0 Yes Have you had any fall that resulted in injury in the last 12 monthso 0 No FALLS RISK SCREEN History of falling - immediate or within 3 months 25 Yes Secondary diagnosis (Do you have 2 or more medical diagnoseso) 0 No Ambulatory aid None/bed rest/wheelchair/nurse 0 No Crutches/cane/walker 15 Yes Furniture 0 No Intravenous therapy Access/Saline/Heparin Lock 0 No Gait/Transferring Normal/ bed rest/ wheelchair 0 No Weak (short steps with or without shuffle, stooped but  able to lift head while 10 Yes walking, may seek support from furniture) Impaired (short steps with shuffle, may have difficulty arising from chair, head 0 No down, impaired balance) Mental Status Oriented to own ability 0 Yes Electronic Signature(s) Signed: 01/18/2019 5:00:08 PM By: Montey Hora Entered By: Montey Hora on 01/18/2019 10:04:33 [name] ([id number]) -------------------------------------------------------------------------------- Foot Assessment Details Patient Name: [name] Date of Service: 01/18/2019 9:45 AM Medical Record Number: [id number] Patient Account Number: [id number] Date of Birth/Sex: 06/08/1960 (58 y.o. M) Treating RN: Montey Hora Primary Care Karna Abed: Park Liter Other Clinician: Referring Aeron Donaghey: Park Liter Treating Reza Crymes/Extender: Melburn Hake, HOYT Weeks in Treatment: 0 Foot Assessment Items Site Locations + = Sensation present, - = Sensation absent, C = Callus, U = Ulcer R = Redness, W = Warmth, M = Maceration, PU = Pre-ulcerative lesion F = Fissure, S = Swelling, D = Dryness Assessment Right: Left: Other Deformity: No No Prior Foot Ulcer: No No Prior Amputation: No No Charcot  Joint: No No Ambulatory Status: Ambulatory Without Help Gait: Unsteady Electronic Signature(s) Signed: 01/18/2019 5:00:08 PM By: Montey Hora Entered By: Montey Hora on 01/18/2019 10:23:27 [name] ([id number]) -------------------------------------------------------------------------------- Nutrition Risk Screening Details Patient Name: [name] Date of Service: 01/18/2019 9:45 AM Medical Record Number: [id number] Patient Account Number: [id number] Date of Birth/Sex: 1960-06-19 (58 y.o. M) Treating RN: Montey Hora Primary Care Dang Mathison: Park Liter Other Clinician: Referring Azriel Jakob: Park Liter Treating Loisann Roach/Extender: Melburn Hake, HOYT Weeks in Treatment: 0 Height (in): 68 Weight (lbs): 400 Body Mass Index (BMI): 60.8 Nutrition Risk Screening Items Score Screening NUTRITION RISK SCREEN: I have an illness or condition that made me change the kind and/or amount of 0 No food I eat I eat fewer than two meals per day 0 No I eat few fruits and vegetables, or milk products 0 No I have three or more drinks of beer, liquor or wine almost every day 0 No I have tooth or mouth problems that make it hard for me to eat 0 No I don't always have enough money to buy the food I need 0 No I eat alone most of the time 0 No I take three or more different prescribed or over-the-counter drugs a day 1 Yes Without wanting to, I have lost or gained 10 pounds in the last six months 0 No I am not always physically able to shop, cook and/or feed myself 0 No Nutrition Protocols Good Risk Protocol 0 No interventions needed Moderate Risk Protocol High Risk Proctocol Risk Level: Good Risk Score: 1 Electronic Signature(s) Signed: 01/18/2019 5:00:08 PM By: Montey Hora Entered By: Montey Hora on 01/18/2019 10:04:40
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
## 2019-01-18 NOTE — Progress Notes (Deleted)
Cardiology Office Note    Date:  01/18/2019   ID:  Less Woolsey, DOB [birth year], MRN [id number]  PCP:  [name], DO  Cardiologist:  Nelva Bush, MD  Electrophysiologist:  None   Chief Complaint: Follow up  History of Present Illness:   Colie Fugitt is a 58 y.o. male with history of ***  Past Medical History:  Diagnosis Date  . Asthma   . Brain damage   . Chronic pain of both knees 07/13/2018  . COPD (chronic obstructive pulmonary disease) ([place])   . Depression   . HFrEF (heart failure with reduced ejection fraction) ([place])    a. 03/2018 Echo: EF 25-30%, diff HK. Mod dil LA.  [name] Hypertension   . Neck pain 07/13/2018  . Persistent atrial fibrillation ([place])    a. 03/2018 s/p DCCV; b. CHA2DS2VASc = 1-->Xarelto; c. 05/2018 recurrent afib-->Amio initiated;   . Sleep apnea     Past Surgical History:  Procedure Laterality Date  . CARDIOVERSION N/A 03/24/2018   Procedure: CARDIOVERSION;  Surgeon: Minna Merritts, MD;  Location: [facility] ORS;  Service: Cardiovascular;  Laterality: N/A;  . CARDIOVERSION N/A 08/08/2018   Procedure: CARDIOVERSION;  Surgeon: Nelva Bush, MD;  Location: [facility] ORS;  Service: Cardiovascular;  Laterality: N/A;  . hearnia repair     X 3- total of two surgeries  . LEG SURGERY      Current Medications: No outpatient medications have been marked as taking for the 01/25/19 encounter (Appointment) with Rise Mu, PA-C.    Allergies:   Patient has no known allergies.   Social History   Socioeconomic History  . Marital status: Single    Spouse name: Not on file  . Number of children: Not on file  . Years of education: Not on file  . Highest education level: Not on file  Occupational History  . Not on file  Social Needs  . Financial resource strain: Not on file  . Food insecurity    Worry: Not on file    Inability: Not on file  . Transportation needs    Medical: Not on file    Non-medical: Not on file  Tobacco Use  . Smoking  status: Never Smoker  . Smokeless tobacco: Never Used  Substance and Sexual Activity  . Alcohol use: Never    Frequency: Never  . Drug use: Never  . Sexual activity: Not Currently  Lifestyle  . Physical activity    Days per week: Not on file    Minutes per session: Not on file  . Stress: Not on file  Relationships  . Social Herbalist on phone: Not on file    Gets together: Not on file    Attends religious service: Not on file    Active member of club or organization: Not on file    Attends meetings of clubs or organizations: Not on file    Relationship status: Not on file  Other Topics Concern  . Not on file  Social History Narrative  . Not on file     Family History:  The patient's family history includes Heart attack in his maternal grandfather and maternal grandmother; Heart failure in his mother; Lung cancer in his father and mother.  ROS:   ROS   EKGs/Labs/Other Studies Reviewed:    Studies reviewed were summarized above. The additional studies were reviewed today:  2D Echo 03/2018: 1. The left ventricle has severely reduced systolic function of 50-09%. The cavity size  was mildly increased. There is no increased left ventricular wall thickness. Left ventricular diastology could not be evaluated due to nondiagnostic images. Left ventricular diffuse hypokinesis.  2. Left atrial size was moderately dilated.  3. Not assessed.  4. The mitral valve was not assessed. There is mild thickening and mild calcification. Mitral valve regurgitation was not significant.  5. The tricuspid valve is not assessed. Tricuspid valve regurgitation was not assessed by color flow Doppler.  6. The aortic valve was not assessed.  7. The interatrial septum was not well visualized.   EKG:  EKG is ordered today.  The EKG ordered today demonstrates ***  Recent Labs: 07/21/2018: Magnesium 2.0 10/23/2018: B Natriuretic Peptide 157.0 01/05/2019: ALT 14; BUN 23; Creatinine, Ser 1.40;  Potassium 4.8; Sodium 134; TSH 12.000 01/16/2019: Hemoglobin 11.7; Platelets 342  Recent Lipid Panel    Component Value Date/Time   CHOL 159 01/05/2019 1645   TRIG 113 01/05/2019 1645   HDL 66 01/05/2019 1645   CHOLHDL 2.5 08/07/2018 0536   VLDL 31 08/07/2018 0536   LDLCALC 73 01/05/2019 1645    PHYSICAL EXAM:    VS:  There were no vitals taken for this visit.  BMI: There is no height or weight on file to calculate BMI.  Physical Exam  Wt Readings from Last 3 Encounters:  01/17/19 (!) 374 lb (169.6 kg)  12/25/18 (!) 395 lb (179.2 kg)  11/17/18 (!) 400 lb (181.4 kg)     ASSESSMENT & PLAN:   1. ***  Disposition: F/u with Dr. Okey Dupre or an APP in ***.   Medication Adjustments/Labs and Tests Ordered: Current medicines are reviewed at length with the patient today.  Concerns regarding medicines are outlined above. Medication changes, Labs and Tests ordered today are summarized above and listed in the Patient Instructions accessible in Encounters.   Signed, [name], PA-C 01/18/2019 3:18 PM     [facility] [place] [address] [place], [place] [address] [phone]

## 2019-01-18 NOTE — Chronic Care Management (AMB) (Signed)
Chronic Care Management   Follow Up Note   01/18/2019 Name: [name] [name] MRN: [id number] DOB: 14-Mar-1960  Referred by: [name], DO Reason for referral : Care Coordination (Medication )   [name] [name] is a 58 y.o. year old male who is a primary care patient of [name], DO. The CCM team was consulted for assistance with chronic disease management and care coordination needs.    Review of patient status, including review of consultants reports, relevant laboratory and other test results, and collaboration with appropriate care team members and the patient's provider was performed as part of comprehensive patient evaluation and provision of chronic care management services.    SDOH (Social Determinants of Health) screening performed today: None. See Care Plan for related entries.   Outpatient Encounter Medications as of 01/18/2019  Medication Sig  . albuterol (VENTOLIN HFA) 108 (90 Base) MCG/ACT inhaler Inhale 2 puffs into the lungs every 6 (six) hours as needed for wheezing or shortness of breath.  . allopurinol (ZYLOPRIM) 100 MG tablet TAKE 1 TABLET BY MOUTH ONCE DAILY  . amiodarone (PACERONE) 200 MG tablet Take 1 tablet (200 mg total) by mouth daily.  [name] atorvastatin (LIPITOR) 40 MG tablet TAKE 1 TABLET BY MOUTH ONCE DAILY (Patient taking differently: Take 40 mg by mouth daily. )  . DULoxetine (CYMBALTA) 20 MG capsule Take 20 mg by mouth daily.  . ferrous sulfate 325 (65 FE) MG tablet Take 1 tablet (325 mg total) by mouth 2 (two) times daily with a meal.  . hydrocortisone (ANUSOL-HC) 25 MG suppository Place 1 suppository (25 mg total) rectally 2 (two) times daily.  [name] levothyroxine (SYNTHROID) 50 MCG tablet Take 1 tablet (50 mcg total) by mouth daily before breakfast.  . lisinopril (ZESTRIL) 2.5 MG tablet TAKE 1 TABLET BY MOUTH ONCE DAILY  . meloxicam (MOBIC) 15 MG tablet Take 1 tablet (15 mg total) by mouth daily.  . metoprolol succinate (TOPROL-XL) 50 MG 24 hr  tablet Take 50 mg by mouth 2 (two) times daily. Take with or immediately following a meal.  . nitroGLYCERIN (NITROSTAT) 0.4 MG SL tablet Place 1 tablet (0.4 mg total) under the tongue every 5 (five) minutes as needed for chest pain.  [name] nystatin-triamcinolone ointment (MYCOLOG) Apply 1 application topically 2 (two) times daily.  [name] spironolactone (ALDACTONE) 25 MG tablet TAKE 1 TABLET BY MOUTH ONCE DAILY  . sulfamethoxazole-trimethoprim (BACTRIM DS) 800-160 MG tablet Take 1 tablet by mouth 2 (two) times daily.  [name] torsemide (DEMADEX) 20 MG tablet Take 2 tablets (40 mg total) by mouth 2 (two) times daily.  . traZODone (DESYREL) 150 MG tablet TAKE 1 TABLET BY MOUTH AT BEDTIME AS NEEDED SLEEP  . warfarin (COUMADIN) 5 MG tablet Take 3 tablets (15 mg total) by mouth daily. (Patient taking differently: Take 10 mg by mouth daily. )   No facility-administered encounter medications on file as of 01/18/2019.      Goals Addressed            This Visit's Progress   . RN-I need help managing my overall health (pt-stated)       Current Barriers:  . Lacks caregiver support.  . Film/video editor.  . Literacy barriers . Transportation barriers . Difficulty obtaining medications . Cognitive Deficits . Chronic Disease Management support and education needs related to managing multiple chronic diseases as evidenced by multiple hospitalizations in the last 6 months.  Nurse Case Manager Clinical Goal(s):  [name] Over the next 90 days, patient will  not experience hospital admission. Hospital Admissions in last 6 months = 5  Interventions:  . Collaborated with Scientist, product/process development at [facility]  regarding patient's need for his antibiotic to be transferred from [address] to there for pick up. Pharmacist stated he had called them 3 times and they had not transferred the script. Pharmacist was able to call again today and they were sending it over.    . Received a message from patient that when he called [facility] his antibiotic was not there.  . Will follow up with patient later today -He is currently at wound clinic according to EMR  Patient Self Care Activities:  . Currently UNABLE TO independently manage multiple chronic disease processes as evidenced by 5 hospital admits in the last 6 mos  Please see past updates related to this goal by clicking on the "Past Updates" button in the selected goal  Previous goal of weight loss d/c'd related to patient's new focus          The care management team will reach out to the patient again over the next 30  days.  The patient has been provided with contact information for the care management team and has been advised to call with any health related questions or concerns.    Ma Rings Anhelica Fowers RN, BSN Nurse Case Education officer, [facility]  [phone]) Business Mobile

## 2019-01-19 ENCOUNTER — Other Ambulatory Visit
Admission: RE | Admit: 2019-01-19 | Discharge: 2019-01-19 | Disposition: A | Discharge status: Home / Self Care | Payer: Medicaid Other | Source: Ambulatory Visit | Attending: Physician Assistant | Admitting: Physician Assistant

## 2019-01-19 ENCOUNTER — Telehealth: Payer: Self-pay

## 2019-01-19 ENCOUNTER — Telehealth: Payer: Medicaid Other | Admitting: Adult Health

## 2019-01-19 DIAGNOSIS — S069X9S Unspecified intracranial injury with loss of consciousness of unspecified duration, sequela: Secondary | ICD-10-CM | POA: Diagnosis not present

## 2019-01-19 DIAGNOSIS — L03116 Cellulitis of left lower limb: Secondary | ICD-10-CM | POA: Insufficient documentation

## 2019-01-20 DIAGNOSIS — S069X9S Unspecified intracranial injury with loss of consciousness of unspecified duration, sequela: Secondary | ICD-10-CM | POA: Diagnosis not present

## 2019-01-21 ENCOUNTER — Emergency Department: Payer: Medicaid Other

## 2019-01-21 ENCOUNTER — Inpatient Hospital Stay
Admission: EM | Admit: 2019-01-21 | Discharge: 2019-01-27 | DRG: 603 | Disposition: A | Discharge status: Home Health | Payer: Medicaid Other | Attending: Internal Medicine | Admitting: Internal Medicine

## 2019-01-21 ENCOUNTER — Encounter: Payer: Self-pay | Admitting: Emergency Medicine

## 2019-01-21 ENCOUNTER — Other Ambulatory Visit: Payer: Self-pay

## 2019-01-21 DIAGNOSIS — D6869 Other thrombophilia: Secondary | ICD-10-CM | POA: Diagnosis present

## 2019-01-21 DIAGNOSIS — I2699 Other pulmonary embolism without acute cor pulmonale: Secondary | ICD-10-CM | POA: Diagnosis present

## 2019-01-21 DIAGNOSIS — I10 Essential (primary) hypertension: Secondary | ICD-10-CM | POA: Diagnosis present

## 2019-01-21 DIAGNOSIS — S069X9S Unspecified intracranial injury with loss of consciousness of unspecified duration, sequela: Secondary | ICD-10-CM | POA: Diagnosis not present

## 2019-01-21 DIAGNOSIS — E785 Hyperlipidemia, unspecified: Secondary | ICD-10-CM | POA: Diagnosis not present

## 2019-01-21 DIAGNOSIS — M1A9XX0 Chronic gout, unspecified, without tophus (tophi): Secondary | ICD-10-CM | POA: Diagnosis present

## 2019-01-21 DIAGNOSIS — Z79890 Hormone replacement therapy (postmenopausal): Secondary | ICD-10-CM

## 2019-01-21 DIAGNOSIS — R079 Chest pain, unspecified: Secondary | ICD-10-CM | POA: Diagnosis present

## 2019-01-21 DIAGNOSIS — I4891 Unspecified atrial fibrillation: Secondary | ICD-10-CM | POA: Diagnosis present

## 2019-01-21 DIAGNOSIS — L0390 Cellulitis, unspecified: Secondary | ICD-10-CM | POA: Diagnosis present

## 2019-01-21 DIAGNOSIS — J410 Simple chronic bronchitis: Secondary | ICD-10-CM | POA: Diagnosis not present

## 2019-01-21 DIAGNOSIS — I110 Hypertensive heart disease with heart failure: Secondary | ICD-10-CM | POA: Diagnosis present

## 2019-01-21 DIAGNOSIS — L03119 Cellulitis of unspecified part of limb: Secondary | ICD-10-CM | POA: Diagnosis not present

## 2019-01-21 DIAGNOSIS — E039 Hypothyroidism, unspecified: Secondary | ICD-10-CM | POA: Diagnosis present

## 2019-01-21 DIAGNOSIS — Z8249 Family history of ischemic heart disease and other diseases of the circulatory system: Secondary | ICD-10-CM

## 2019-01-21 DIAGNOSIS — R0789 Other chest pain: Secondary | ICD-10-CM | POA: Diagnosis not present

## 2019-01-21 DIAGNOSIS — I482 Chronic atrial fibrillation, unspecified: Secondary | ICD-10-CM | POA: Diagnosis not present

## 2019-01-21 DIAGNOSIS — I447 Left bundle-branch block, unspecified: Secondary | ICD-10-CM | POA: Diagnosis present

## 2019-01-21 DIAGNOSIS — Z87820 Personal history of traumatic brain injury: Secondary | ICD-10-CM

## 2019-01-21 DIAGNOSIS — I5042 Chronic combined systolic (congestive) and diastolic (congestive) heart failure: Secondary | ICD-10-CM | POA: Diagnosis present

## 2019-01-21 DIAGNOSIS — Z79899 Other long term (current) drug therapy: Secondary | ICD-10-CM

## 2019-01-21 DIAGNOSIS — I420 Dilated cardiomyopathy: Secondary | ICD-10-CM | POA: Diagnosis present

## 2019-01-21 DIAGNOSIS — G4733 Obstructive sleep apnea (adult) (pediatric): Secondary | ICD-10-CM | POA: Diagnosis present

## 2019-01-21 DIAGNOSIS — I5022 Chronic systolic (congestive) heart failure: Secondary | ICD-10-CM

## 2019-01-21 DIAGNOSIS — I5033 Acute on chronic diastolic (congestive) heart failure: Secondary | ICD-10-CM | POA: Diagnosis present

## 2019-01-21 DIAGNOSIS — Z20828 Contact with and (suspected) exposure to other viral communicable diseases: Secondary | ICD-10-CM | POA: Diagnosis not present

## 2019-01-21 DIAGNOSIS — F321 Major depressive disorder, single episode, moderate: Secondary | ICD-10-CM | POA: Diagnosis present

## 2019-01-21 DIAGNOSIS — I252 Old myocardial infarction: Secondary | ICD-10-CM

## 2019-01-21 DIAGNOSIS — M109 Gout, unspecified: Secondary | ICD-10-CM | POA: Diagnosis present

## 2019-01-21 DIAGNOSIS — L03116 Cellulitis of left lower limb: Secondary | ICD-10-CM | POA: Diagnosis not present

## 2019-01-21 DIAGNOSIS — A419 Sepsis, unspecified organism: Secondary | ICD-10-CM

## 2019-01-21 DIAGNOSIS — I878 Other specified disorders of veins: Secondary | ICD-10-CM | POA: Diagnosis present

## 2019-01-21 DIAGNOSIS — I4819 Other persistent atrial fibrillation: Secondary | ICD-10-CM | POA: Diagnosis present

## 2019-01-21 DIAGNOSIS — Z86718 Personal history of other venous thrombosis and embolism: Secondary | ICD-10-CM

## 2019-01-21 DIAGNOSIS — Z6841 Body Mass Index (BMI) 40.0 and over, adult: Secondary | ICD-10-CM

## 2019-01-21 DIAGNOSIS — S81809A Unspecified open wound, unspecified lower leg, initial encounter: Secondary | ICD-10-CM | POA: Diagnosis present

## 2019-01-21 DIAGNOSIS — L03115 Cellulitis of right lower limb: Principal | ICD-10-CM | POA: Diagnosis present

## 2019-01-21 DIAGNOSIS — G8929 Other chronic pain: Secondary | ICD-10-CM | POA: Diagnosis present

## 2019-01-21 DIAGNOSIS — Z86711 Personal history of pulmonary embolism: Secondary | ICD-10-CM

## 2019-01-21 DIAGNOSIS — E119 Type 2 diabetes mellitus without complications: Secondary | ICD-10-CM | POA: Diagnosis present

## 2019-01-21 DIAGNOSIS — J432 Centrilobular emphysema: Secondary | ICD-10-CM | POA: Diagnosis present

## 2019-01-21 DIAGNOSIS — I2510 Atherosclerotic heart disease of native coronary artery without angina pectoris: Secondary | ICD-10-CM | POA: Diagnosis present

## 2019-01-21 DIAGNOSIS — Z7901 Long term (current) use of anticoagulants: Secondary | ICD-10-CM

## 2019-01-21 DIAGNOSIS — I890 Lymphedema, not elsewhere classified: Secondary | ICD-10-CM | POA: Diagnosis present

## 2019-01-21 DIAGNOSIS — F3341 Major depressive disorder, recurrent, in partial remission: Secondary | ICD-10-CM | POA: Diagnosis present

## 2019-01-21 DIAGNOSIS — J449 Chronic obstructive pulmonary disease, unspecified: Secondary | ICD-10-CM | POA: Diagnosis present

## 2019-01-21 DIAGNOSIS — Z79891 Long term (current) use of opiate analgesic: Secondary | ICD-10-CM

## 2019-01-21 HISTORY — DX: Other cardiomyopathies: I42.8

## 2019-01-21 HISTORY — DX: Personal history of pulmonary embolism: Z86.711

## 2019-01-21 HISTORY — DX: Acute myocardial infarction, unspecified: I21.9

## 2019-01-21 HISTORY — DX: Personal history of other venous thrombosis and embolism: Z86.718

## 2019-01-21 HISTORY — DX: Morbid (severe) obesity due to excess calories: E66.01

## 2019-01-21 LAB — CBC WITH DIFFERENTIAL/PLATELET
Abs Immature Granulocytes: 0.07 10*3/uL (ref 0.00–0.07)
Basophils Absolute: 0.1 10*3/uL (ref 0.0–0.1)
Basophils Relative: 1 %
Eosinophils Absolute: 0.2 10*3/uL (ref 0.0–0.5)
Eosinophils Relative: 3 %
HCT: 33.5 % — ABNORMAL LOW (ref 39.0–52.0)
Hemoglobin: 10.9 g/dL — ABNORMAL LOW (ref 13.0–17.0)
Immature Granulocytes: 1 %
Lymphocytes Relative: 13 %
Lymphs Abs: 0.9 10*3/uL (ref 0.7–4.0)
MCH: 30.1 pg (ref 26.0–34.0)
MCHC: 32.5 g/dL (ref 30.0–36.0)
MCV: 92.5 fL (ref 80.0–100.0)
Monocytes Absolute: 0.6 10*3/uL (ref 0.1–1.0)
Monocytes Relative: 9 %
Neutro Abs: 5.3 10*3/uL (ref 1.7–7.7)
Neutrophils Relative %: 73 %
Platelets: 321 10*3/uL (ref 150–400)
RBC: 3.62 MIL/uL — ABNORMAL LOW (ref 4.22–5.81)
RDW: 14.7 % (ref 11.5–15.5)
WBC: 7.2 10*3/uL (ref 4.0–10.5)
nRBC: 0 % (ref 0.0–0.2)

## 2019-01-21 LAB — SARS CORONAVIRUS 2 (TAT 6-24 HRS): SARS Coronavirus 2: NEGATIVE

## 2019-01-21 LAB — PROCALCITONIN: Procalcitonin: <0.1 ng/mL

## 2019-01-21 LAB — BASIC METABOLIC PANEL
Anion gap: 12 (ref 5–15)
BUN: 17 mg/dL (ref 6–20)
CO2: 24 mmol/L (ref 22–32)
Calcium: 8.4 mg/dL — ABNORMAL LOW (ref 8.9–10.3)
Chloride: 99 mmol/L (ref 98–111)
Creatinine, Ser: 1.14 mg/dL (ref 0.61–1.24)
GFR calc Af Amer: >60 mL/min (ref >60)
GFR calc non Af Amer: >60 mL/min (ref >60)
Glucose, Bld: 115 mg/dL — ABNORMAL HIGH (ref 70–99)
Potassium: 3.9 mmol/L (ref 3.5–5.1)
Sodium: 135 mmol/L (ref 135–145)

## 2019-01-21 LAB — TROPONIN I (HIGH SENSITIVITY)
Troponin I (High Sensitivity): 25 ng/L — ABNORMAL HIGH (ref <18)
Troponin I (High Sensitivity): 31 ng/L — ABNORMAL HIGH (ref <18)
Troponin I (High Sensitivity): 32 ng/L — ABNORMAL HIGH (ref <18)
Troponin I (High Sensitivity): 30 ng/L — ABNORMAL HIGH (ref <18)
Troponin I (High Sensitivity): 26 ng/L — ABNORMAL HIGH (ref <18)

## 2019-01-21 LAB — SEDIMENTATION RATE: Sed Rate: 53 mm/hr — ABNORMAL HIGH (ref 0–20)

## 2019-01-21 LAB — URINALYSIS, COMPLETE (UACMP) WITH MICROSCOPIC
Bilirubin Urine: NEGATIVE
Glucose, UA: NEGATIVE mg/dL
Hgb urine dipstick: NEGATIVE
Ketones, ur: NEGATIVE mg/dL
Nitrite: NEGATIVE
Protein, ur: 30 mg/dL — AB
Specific Gravity, Urine: 1.027 (ref 1.005–1.030)
pH: 5 (ref 5.0–8.0)

## 2019-01-21 LAB — LACTIC ACID, PLASMA
Lactic Acid, Venous: 2.4 mmol/L (ref 0.5–1.9)
Lactic Acid, Venous: 2.1 mmol/L (ref 0.5–1.9)
Lactic Acid, Venous: 2.2 mmol/L (ref 0.5–1.9)
Lactic Acid, Venous: 2.4 mmol/L (ref 0.5–1.9)

## 2019-01-21 LAB — AEROBIC CULTURE W GRAM STAIN (SUPERFICIAL SPECIMEN)

## 2019-01-21 LAB — PROTIME-INR
INR: 2.9 — ABNORMAL HIGH (ref 0.8–1.2)
Prothrombin Time: 30.3 seconds — ABNORMAL HIGH (ref 11.4–15.2)

## 2019-01-21 LAB — APTT: aPTT: 39 seconds — ABNORMAL HIGH (ref 24–36)

## 2019-01-21 LAB — POC SARS CORONAVIRUS 2 AG: SARS Coronavirus 2 Ag: NEGATIVE

## 2019-01-21 MED ORDER — LEVOTHYROXINE SODIUM 50 MCG PO TABS
50.0000 ug | ORAL_TABLET | Freq: Every day | ORAL | Status: DC
  Administered 2019-01-22 – 2019-01-27 (×6): 50 ug via ORAL
  Filled 2019-01-21 (×7): qty 1

## 2019-01-21 MED ORDER — LACTATED RINGERS IV BOLUS
500.0000 mL | Freq: Once | INTRAVENOUS | Status: AC
  Administered 2019-01-21: 500 mL via INTRAVENOUS

## 2019-01-21 MED ORDER — VANCOMYCIN HCL 1.5 G IV SOLR
1500.0000 mg | Freq: Once | INTRAVENOUS | Status: AC
  Administered 2019-01-21: 1500 mg via INTRAVENOUS
  Filled 2019-01-21: qty 1500

## 2019-01-21 MED ORDER — FERROUS SULFATE 325 (65 FE) MG PO TABS
325.0000 mg | ORAL_TABLET | Freq: Two times a day (BID) | ORAL | Status: DC
  Administered 2019-01-21 – 2019-01-27 (×11): 325 mg via ORAL
  Filled 2019-01-21 (×12): qty 1

## 2019-01-21 MED ORDER — ALLOPURINOL 100 MG PO TABS
100.0000 mg | ORAL_TABLET | Freq: Every day | ORAL | Status: DC
  Administered 2019-01-22 – 2019-01-27 (×6): 100 mg via ORAL
  Filled 2019-01-21 (×7): qty 1

## 2019-01-21 MED ORDER — AMIODARONE HCL 200 MG PO TABS
200.0000 mg | ORAL_TABLET | Freq: Every day | ORAL | Status: DC
  Administered 2019-01-21 – 2019-01-27 (×7): 200 mg via ORAL
  Filled 2019-01-21 (×8): qty 1

## 2019-01-21 MED ORDER — NYSTATIN-TRIAMCINOLONE 100000-0.1 UNIT/GM-% EX OINT
1.0000 "application " | TOPICAL_OINTMENT | Freq: Two times a day (BID) | CUTANEOUS | Status: DC
  Administered 2019-01-24 – 2019-01-27 (×5): 1 via TOPICAL
  Filled 2019-01-21 (×3): qty 15

## 2019-01-21 MED ORDER — ALBUTEROL SULFATE (2.5 MG/3ML) 0.083% IN NEBU: 3.0000 mL | INHALATION_SOLUTION | Freq: Four times a day (QID) | RESPIRATORY_TRACT | Status: DC | PRN

## 2019-01-21 MED ORDER — HYDROCORTISONE ACETATE 25 MG RE SUPP
25.0000 mg | Freq: Two times a day (BID) | RECTAL | Status: DC
  Administered 2019-01-24: 25 mg via RECTAL
  Filled 2019-01-21 (×15): qty 1

## 2019-01-21 MED ORDER — WARFARIN - PHARMACIST DOSING INPATIENT
Freq: Every day | Status: DC
  Filled 2019-01-21: qty 1

## 2019-01-21 MED ORDER — TORSEMIDE 20 MG PO TABS
40.0000 mg | ORAL_TABLET | Freq: Two times a day (BID) | ORAL | Status: DC
  Administered 2019-01-21 – 2019-01-25 (×7): 40 mg via ORAL
  Filled 2019-01-21 (×8): qty 2

## 2019-01-21 MED ORDER — HYDRALAZINE HCL 25 MG PO TABS
25.0000 mg | ORAL_TABLET | Freq: Three times a day (TID) | ORAL | Status: DC | PRN
  Filled 2019-01-21: qty 1

## 2019-01-21 MED ORDER — MORPHINE SULFATE (PF) 4 MG/ML IV SOLN
4.0000 mg | Freq: Once | INTRAVENOUS | Status: AC
  Administered 2019-01-21: 4 mg via INTRAVENOUS
  Filled 2019-01-21: qty 1

## 2019-01-21 MED ORDER — WARFARIN SODIUM 10 MG PO TABS
10.0000 mg | ORAL_TABLET | Freq: Every day | ORAL | Status: DC
  Administered 2019-01-21: 10 mg via ORAL
  Filled 2019-01-21 (×2): qty 1

## 2019-01-21 MED ORDER — SODIUM CHLORIDE 0.9 % IV SOLN
2.0000 g | Freq: Once | INTRAVENOUS | Status: AC
  Administered 2019-01-21: 2 g via INTRAVENOUS
  Filled 2019-01-21: qty 20

## 2019-01-21 MED ORDER — VANCOMYCIN HCL 10 G IV SOLR: 2500.0000 mg | INTRAVENOUS | Status: DC

## 2019-01-21 MED ORDER — SPIRONOLACTONE 25 MG PO TABS
25.0000 mg | ORAL_TABLET | Freq: Every day | ORAL | Status: DC
  Administered 2019-01-21 – 2019-01-25 (×5): 25 mg via ORAL
  Filled 2019-01-21 (×6): qty 1

## 2019-01-21 MED ORDER — LACTATED RINGERS IV BOLUS: 1000.0000 mL | Freq: Once | INTRAVENOUS | Status: DC

## 2019-01-21 MED ORDER — OXYCODONE-ACETAMINOPHEN 5-325 MG PO TABS
1.0000 | ORAL_TABLET | ORAL | Status: DC | PRN
  Administered 2019-01-22 – 2019-01-27 (×7): 1 via ORAL
  Filled 2019-01-21 (×8): qty 1

## 2019-01-21 MED ORDER — ONDANSETRON HCL 4 MG/2ML IJ SOLN
4.0000 mg | Freq: Once | INTRAMUSCULAR | Status: AC
  Administered 2019-01-21: 4 mg via INTRAVENOUS
  Filled 2019-01-21: qty 2

## 2019-01-21 MED ORDER — MORPHINE SULFATE (PF) 2 MG/ML IV SOLN
2.0000 mg | INTRAVENOUS | Status: DC | PRN
  Administered 2019-01-21 – 2019-01-27 (×12): 2 mg via INTRAVENOUS
  Filled 2019-01-21 (×13): qty 1

## 2019-01-21 MED ORDER — VANCOMYCIN HCL IN DEXTROSE 1-5 GM/200ML-% IV SOLN
1000.0000 mg | Freq: Once | INTRAVENOUS | Status: AC
  Administered 2019-01-21: 1000 mg via INTRAVENOUS
  Filled 2019-01-21: qty 200

## 2019-01-21 MED ORDER — LISINOPRIL 5 MG PO TABS
2.5000 mg | ORAL_TABLET | Freq: Every day | ORAL | Status: DC
  Administered 2019-01-21 – 2019-01-27 (×6): 2.5 mg via ORAL
  Filled 2019-01-21 (×8): qty 1

## 2019-01-21 MED ORDER — TRAZODONE HCL 50 MG PO TABS
150.0000 mg | ORAL_TABLET | Freq: Every day | ORAL | Status: DC
  Administered 2019-01-21 – 2019-01-26 (×6): 150 mg via ORAL
  Filled 2019-01-21 (×7): qty 1

## 2019-01-21 MED ORDER — ACETAMINOPHEN 325 MG PO TABS
650.0000 mg | ORAL_TABLET | Freq: Four times a day (QID) | ORAL | Status: DC | PRN
  Administered 2019-01-27: 650 mg via ORAL
  Filled 2019-01-21: qty 2

## 2019-01-21 MED ORDER — ONDANSETRON HCL 4 MG/2ML IJ SOLN
4.0000 mg | Freq: Three times a day (TID) | INTRAMUSCULAR | Status: DC | PRN
  Administered 2019-01-21: 4 mg via INTRAVENOUS
  Filled 2019-01-21: qty 2

## 2019-01-21 MED ORDER — METOPROLOL SUCCINATE ER 50 MG PO TB24
50.0000 mg | ORAL_TABLET | Freq: Two times a day (BID) | ORAL | Status: DC
  Administered 2019-01-21 – 2019-01-27 (×9): 50 mg via ORAL
  Filled 2019-01-21: qty 2
  Filled 2019-01-21 (×6): qty 1
  Filled 2019-01-21 (×2): qty 2
  Filled 2019-01-21 (×2): qty 1
  Filled 2019-01-21 (×2): qty 2
  Filled 2019-01-21: qty 1

## 2019-01-21 MED ORDER — DULOXETINE HCL 20 MG PO CPEP
20.0000 mg | ORAL_CAPSULE | Freq: Every day | ORAL | Status: DC
  Administered 2019-01-21 – 2019-01-27 (×7): 20 mg via ORAL
  Filled 2019-01-21 (×9): qty 1

## 2019-01-21 MED ORDER — SODIUM CHLORIDE 0.9 % IV SOLN: 2.0000 g | INTRAVENOUS | Status: DC

## 2019-01-21 MED ORDER — ATORVASTATIN CALCIUM 20 MG PO TABS
40.0000 mg | ORAL_TABLET | Freq: Every day | ORAL | Status: DC
  Administered 2019-01-21 – 2019-01-27 (×7): 40 mg via ORAL
  Filled 2019-01-21 (×9): qty 2

## 2019-01-21 MED ORDER — NITROGLYCERIN 0.4 MG SL SUBL
0.4000 mg | SUBLINGUAL_TABLET | SUBLINGUAL | Status: DC | PRN
  Administered 2019-01-22 (×2): 0.4 mg via SUBLINGUAL
  Filled 2019-01-21: qty 1

## 2019-01-21 NOTE — ED Notes (Signed)
Legs elevated- pt in recliner.

## 2019-01-21 NOTE — ED Provider Notes (Signed)
[facility] Emergency Department Provider Note   ____________________________________________   First MD Initiated Contact with Patient 01/21/19 1209     (approximate)  I have reviewed the triage vital signs and the nursing notes.   HISTORY  Chief Complaint Chest Pain    HPI [name] [name] is a 58 y.o. male with past medical history of COPD, CHF, atrial fibrillation, and PE on Coumadin who presents to the ED complaining of chest pain.  Patient reports that when he woke up earlier today he noticed a pressure affecting the left side of his chest.  He states it seems to be worse with even slight movement.  He denies any associated fevers or cough, but has dealt with chronic shortness of breath that is unchanged today.  He states he has been compliant with his Coumadin although his last INR was low.  He has felt nauseous with the onset of the pain and vomited twice prior to arrival.  He also states that he has been dealing with wounds to his bilateral lower extremities, for which she has been following at the wound clinic.  These initially seem to be improving with local wound care, but he states when he was seen last week he was told he needed to go to the hospital for IV antibiotics.        Past Medical History:  Diagnosis Date  . Asthma   . Brain damage   . Chronic pain of both knees 07/13/2018  . COPD (chronic obstructive pulmonary disease) (HCC)   . Depression   . HFrEF (heart failure with reduced ejection fraction) (HCC)    a. 03/2018 Echo: EF 25-30%, diff HK. Mod dil LA.  [name] Hypertension   . MI (myocardial infarction) (HCC)   . Neck pain 07/13/2018  . Persistent atrial fibrillation (HCC)    a. 03/2018 s/p DCCV; b. CHA2DS2VASc = 1-->Xarelto; c. 05/2018 recurrent afib-->Amio initiated;   . Sleep apnea     Patient Active Problem List   Diagnosis Date Noted  . PE (pulmonary thromboembolism) (HCC) 01/21/2019  . Open leg wound 01/21/2019  . HLD  (hyperlipidemia) 01/21/2019  . CAD (coronary artery disease) 01/21/2019  . Noncompliance 01/09/2019  . Acquired thrombophilia (HCC) 11/28/2018  . Subtherapeutic international normalized ratio (INR) 11/11/2018  . Acute pulmonary embolism (HCC) 11/01/2018  . Depression, major, single episode, moderate (HCC) 09/17/2018  . Chest pain 08/06/2018  . Acute on chronic systolic CHF (congestive heart failure) (HCC) 07/18/2018  . Chronic atrial fibrillation (HCC)   . Personal history of DVT (deep vein thrombosis) 07/13/2018  . History of pulmonary embolism (on Xarelto) 07/13/2018  . Chronic bilateral low back pain with bilateral sciatica 07/13/2018  . TBI (traumatic brain injury) (HCC) 07/04/2018  . Anemia 06/27/2018  . Abnormal thyroid blood test 06/27/2018  . Diet-controlled diabetes mellitus (HCC) 06/06/2018  . HTN (hypertension) 06/06/2018  . Gout 06/06/2018  . Morbid obesity (HCC) 06/06/2018  . Chronic pain 06/06/2018  . Ulcers of both lower extremities, limited to breakdown of skin (HCC) 06/06/2018  . Persistent atrial fibrillation (HCC)   . Chronic systolic heart failure (HCC) 02/02/2018  . COPD (chronic obstructive pulmonary disease) (HCC) 02/02/2018  . Obstructive sleep apnea 02/02/2018  . Acute respiratory failure with hypoxia (HCC) 01/27/2018    Past Surgical History:  Procedure Laterality Date  . CARDIOVERSION N/A 03/24/2018   Procedure: CARDIOVERSION;  Surgeon: Antonieta Iba, MD;  Location: [facility] ORS;  Service: Cardiovascular;  Laterality: N/A;  . CARDIOVERSION N/A 08/08/2018  Procedure: CARDIOVERSION;  Surgeon: Nelva Bush, MD;  Location: [facility] ORS;  Service: Cardiovascular;  Laterality: N/A;  . hearnia repair     X 3- total of two surgeries  . LEG SURGERY      Prior to Admission medications   Medication Sig Start Date End Date Taking? Authorizing Provider  albuterol (VENTOLIN HFA) 108 (90 Base) MCG/ACT inhaler Inhale 2 puffs into the lungs every 6 (six) hours as  needed for wheezing or shortness of breath. 07/04/18   Johnson, Megan P, DO  allopurinol (ZYLOPRIM) 100 MG tablet TAKE 1 TABLET BY MOUTH ONCE DAILY Patient taking differently: Take 100 mg by mouth daily.  01/17/19   Johnson, Megan P, DO  amiodarone (PACERONE) 200 MG tablet Take 1 tablet (200 mg total) by mouth daily. 08/11/18   Epifanio Lesches, MD  atorvastatin (LIPITOR) 40 MG tablet TAKE 1 TABLET BY MOUTH ONCE DAILY Patient taking differently: Take 40 mg by mouth daily.  09/08/18   Johnson, Megan P, DO  DULoxetine (CYMBALTA) 20 MG capsule Take 20 mg by mouth daily.    [provider]  ferrous sulfate 325 (65 FE) MG tablet Take 1 tablet (325 mg total) by mouth 2 (two) times daily with a meal. 01/05/19   Johnson, Megan P, DO  hydrocortisone (ANUSOL-HC) 25 MG suppository Place 1 suppository (25 mg total) rectally 2 (two) times daily. 11/28/18   Park Liter P, DO  levothyroxine (SYNTHROID) 50 MCG tablet Take 1 tablet (50 mcg total) by mouth daily before breakfast. 09/19/18   Johnson, Megan P, DO  lisinopril (ZESTRIL) 2.5 MG tablet TAKE 1 TABLET BY MOUTH ONCE DAILY Patient taking differently: Take 2.5 mg by mouth daily.  01/02/19   Johnson, Megan P, DO  meloxicam (MOBIC) 15 MG tablet Take 1 tablet (15 mg total) by mouth daily. 07/04/18   Johnson, Megan P, DO  metoprolol succinate (TOPROL-XL) 50 MG 24 hr tablet Take 50 mg by mouth 2 (two) times daily. Take with or immediately following a meal.    [provider]  nitroGLYCERIN (NITROSTAT) 0.4 MG SL tablet Place 1 tablet (0.4 mg total) under the tongue every 5 (five) minutes as needed for chest pain. 01/18/18   Dustin Flock, MD  nystatin-triamcinolone ointment [facility]) Apply 1 application topically 2 (two) times daily. 12/25/18   Billey Co, MD  spironolactone (ALDACTONE) 25 MG tablet TAKE 1 TABLET BY MOUTH ONCE DAILY Patient taking differently: Take 25 mg by mouth daily.  01/17/19   Johnson, Megan P, DO   sulfamethoxazole-trimethoprim (BACTRIM DS) 800-160 MG tablet Take 1 tablet by mouth 2 (two) times daily. 01/16/19   Johnson, Megan P, DO  torsemide (DEMADEX) 20 MG tablet Take 2 tablets (40 mg total) by mouth 2 (two) times daily. 01/17/19   End, Harrell Gave, MD  traZODone (DESYREL) 150 MG tablet TAKE 1 TABLET BY MOUTH AT BEDTIME AS NEEDED SLEEP Patient taking differently: Take 150 mg by mouth at bedtime.  10/30/18   Park Liter P, DO  warfarin (COUMADIN) 5 MG tablet Take 3 tablets (15 mg total) by mouth daily. Patient taking differently: Take 10 mg by mouth daily.  11/13/18 11/13/19  [name], DO    Allergies Patient has no known allergies.  Family History  Problem Relation Age of Onset  . Heart failure Mother   . Lung cancer Mother   . Lung cancer Father   . Heart attack Maternal Grandmother   . Heart attack Maternal Grandfather     Social History Social  History   Tobacco Use  . Smoking status: Never Smoker  . Smokeless tobacco: Never Used  Substance Use Topics  . Alcohol use: Never    Frequency: Never  . Drug use: Never    Review of Systems  Constitutional: No fever/chills Eyes: No visual changes. ENT: No sore throat. Cardiovascular: Positive for chest pain. Respiratory: Positive for chronic shortness of breath. Gastrointestinal: No abdominal pain.  Positive for nausea and vomiting.  No diarrhea.  No constipation. Genitourinary: Negative for dysuria. Musculoskeletal: Negative for back pain. Skin: Negative for rash.  Positive for wound. Neurological: Negative for headaches, focal weakness or numbness.  ____________________________________________   PHYSICAL EXAM:  VITAL SIGNS: ED Triage Vitals  Enc Vitals Group     BP --      Pulse --      Resp --      Temp --      Temp src --      SpO2 --      Weight 01/21/19 1206 (!) 372 lb 9.2 oz (169 kg)     Height 01/21/19 1206 5\' 8"  (1.727 m)     Head Circumference --      Peak Flow --      Pain Score  01/21/19 1205 9     Pain Loc --      Pain Edu? --      Excl. in GC? --     Constitutional: Alert and oriented.  Chronically ill and morbidly obese male in no acute distress. Eyes: Conjunctivae are normal. Head: Atraumatic. Nose: No congestion/rhinnorhea. Mouth/Throat: Mucous membranes are moist. Neck: Normal ROM Cardiovascular: Normal rate, regular rhythm. Grossly normal heart sounds. Respiratory: Normal respiratory effort.  No retractions. Lungs CTAB. Gastrointestinal: Soft and nontender. No distention. Genitourinary: deferred Musculoskeletal: No lower extremity tenderness nor edema. Neurologic:  Normal speech and language. No gross focal neurologic deficits are appreciated. Skin: Wounds noted to bilateral lower legs, left lower extremity wound greater than right with purulent drainage coming from both wounds. Psychiatric: Mood and affect are normal. Speech and behavior are normal.  ____________________________________________   LABS (all labs ordered are listed, but only abnormal results are displayed)  Labs Reviewed  CBC WITH DIFFERENTIAL/PLATELET - Abnormal; Notable for the following components:      Result Value   RBC 3.62 (*)    Hemoglobin 10.9 (*)    HCT 33.5 (*)    All other components within normal limits  BASIC METABOLIC PANEL - Abnormal; Notable for the following components:   Glucose, Bld 115 (*)    Calcium 8.4 (*)    All other components within normal limits  PROTIME-INR - Abnormal; Notable for the following components:   Prothrombin Time 30.3 (*)    INR 2.9 (*)    All other components within normal limits  LACTIC ACID, PLASMA - Abnormal; Notable for the following components:   Lactic Acid, Venous 2.4 (*)    All other components within normal limits  TROPONIN I (HIGH SENSITIVITY) - Abnormal; Notable for the following components:   Troponin I (High Sensitivity) 25 (*)    All other components within normal limits  CULTURE, BLOOD (ROUTINE X 2)  CULTURE, BLOOD  (ROUTINE X 2)  URINE CULTURE  SARS CORONAVIRUS 2 (TAT 6-24 HRS)  LACTIC ACID, PLASMA  URINALYSIS, COMPLETE (UACMP) WITH MICROSCOPIC  APTT  POC SARS CORONAVIRUS 2 AG -  ED  POC SARS CORONAVIRUS 2 AG  TROPONIN I (HIGH SENSITIVITY)   ____________________________________________  EKG  ED ECG REPORT  Harriet MassonI, Liana Camerer, the attending physician, personally viewed and interpreted this ECG.   Date: 01/21/2019  EKG Time: 12:12  Rate: 80  Rhythm: normal sinus rhythm  Axis: Normal  Intervals:left bundle branch block  ST&T Change: None, negative sgarbossa   PROCEDURES  Procedure(s) performed (including Critical Care):  .Critical Care Performed by: Chesley NoonJessup, Vearl Allbaugh, MD Authorized by: Chesley NoonJessup, Danton Palmateer, MD   Critical care provider statement:    Critical care time (minutes):  45   Critical care time was exclusive of:  Separately billable procedures and treating other patients and teaching time   Critical care was necessary to treat or prevent imminent or life-threatening deterioration of the following conditions:  Sepsis   Critical care was time spent personally by me on the following activities:  Discussions with consultants, evaluation of patient's response to treatment, examination of patient, ordering and performing treatments and interventions, ordering and review of laboratory studies, ordering and review of radiographic studies, pulse oximetry, re-evaluation of patient's condition, obtaining history from patient or surrogate and review of old charts   I assumed direction of critical care for this patient from another provider in my specialty: no       ____________________________________________   INITIAL IMPRESSION / ASSESSMENT AND PLAN / ED COURSE       58 year old male with history of CHF, PE and A. fib on Coumadin presents to the ED for acute onset chest pain earlier this morning as well as worsening wounds to his bilateral lower extremities.  Presentation is concerning for  sepsis given his fever and he may not manifest a tachycardia with being beta blocked.  He does have obvious infection to wounds to his lower extremities, will start on Rocephin and vancomycin.  Chest pain work-up has been unremarkable, EKG without acute ischemic changes, does show known left bundle branch block with negative Sgarbossa criteria.  Troponin comparable to patient's baseline and pain is improved following dose of morphine.  Low suspicion for acute PE given his description of symptoms and finding of a therapeutic INR.  Chest x-ray is negative for acute process.  Reviewed cardiology notes, he has had multiple catheterizations showing clean coronaries.  He will require admission for further IV antibiotic administration, we will hydrate with IV fluids gently given his CHF history.  Case discussed with hospitalist, who accepts patient for admission.      ____________________________________________   FINAL CLINICAL IMPRESSION(S) / ED DIAGNOSES  Final diagnoses:  Sepsis without acute organ dysfunction, due to unspecified organism [facility](HCC)  Chest pain, unspecified type  Cellulitis of lower extremity, unspecified laterality     ED Discharge Orders    None       Note:  This document was prepared using Dragon voice recognition software and may include unintentional dictation errors.   Chesley NoonJessup, Lewin Pellow, MD 01/21/19 [phone]

## 2019-01-21 NOTE — ED Notes (Signed)
Pt requested nausea meds and then pain meds - states chest and leg pain.

## 2019-01-21 NOTE — Progress Notes (Signed)
ANTICOAGULATION CONSULT NOTE - Initial Consult  Pharmacy Consult for Warfarin  Indication: atrial fibrillation  No Known Allergies  Patient Measurements: Height: 5\' 8"  (172.7 cm) Weight: (!) 372 lb 9.2 oz (169 kg) IBW/kg (Calculated) : 68.4 Heparin Dosing Weight:   Vital Signs: Temp: 100.4 F (38 C) (12/06 1210) Temp Source: Oral (12/06 1210) BP: 149/68 (12/06 1430) Pulse Rate: 72 (12/06 1430)  Labs: Recent Labs    01/21/19 1220 01/21/19 1421  HGB 10.9*  --   HCT 33.5*  --   PLT 321  --   LABPROT 30.3*  --   INR 2.9*  --   CREATININE 1.14  --   TROPONINIHS 25* 31*    Estimated Creatinine Clearance: 108.5 mL/min (by C-G formula based on SCr of 1.14 mg/dL).   Medical History: Past Medical History:  Diagnosis Date  . Asthma   . Brain damage   . Chronic pain of both knees 07/13/2018  . COPD (chronic obstructive pulmonary disease) ([place])   . Depression   . HFrEF (heart failure with reduced ejection fraction) ([place])    a. 03/2018 Echo: EF 25-30%, diff HK. Mod dil LA.  [name] Hypertension   . MI (myocardial infarction) ([place])   . Neck pain 07/13/2018  . Persistent atrial fibrillation ([place])    a. 03/2018 s/p DCCV; b. CHA2DS2VASc = 1-->Xarelto; c. 05/2018 recurrent afib-->Amio initiated;   . Sleep apnea     Medications:  (Not in a hospital admission)   Assessment: Pharmacy consulted to dose warfarin in this 58 year old male admitted with cellulitis, PMH of PE/DVT and Afib. 12/6:  INR = 2.9.  Hgb = 10.9,  Hct = 33.5  Pt was on warfarin 10 mg PO daily at home, last dose was on 12/5 @ 1800.   Goal of Therapy:  INR 2-3   Plan:  Will continue pt on home dose of warfarin 10 mg PO daily to resume on 12/6 @ 1800. Will check INR daily since pt is on abx.   Jeffery Bachmeier D 01/21/2019,3:39 PM

## 2019-01-21 NOTE — Progress Notes (Signed)
CODE SEPSIS - PHARMACY COMMUNICATION  **Broad Spectrum Antibiotics should be administered within 1 hour of Sepsis diagnosis**  Time Code Sepsis Called/Page Received: 12:21  Antibiotics Ordered: Ceftriaxone and Vancomycin  Time of 1st antibiotic administration: Ceftriaxone given at 12:29  Additional action taken by pharmacy: n/a  If necessary, Name of Provider/Nurse Contacted: n/a   Vira Blanco ,PharmD Clinical Pharmacist  01/21/2019  12:32 PM

## 2019-01-21 NOTE — H&P (Signed)
History and Physical    [name] [name] MOQ:[id number] DOB: Jun 21, 1960 DOA: 01/21/2019  Referring MD/NP/PA:   PCP: [name], DO   Patient coming from:  The patient is coming from home.  At baseline, pt is independent for most of ADL.        Chief Complaint: Bilateral leg pain and chest pain  HPI: [name] [name] is a 58 y.o. male with medical history significant of hypertension, hyperlipidemia, diet-controlled diabetes, COPD, hypothyroidism, depression, OSA on CPAP, atrial fibrillation on Coumadin, CAD, dCHF, PE/DVT on Coumadin, who presents with bilateral leg pain and chest pain.  His chest pain started early today, which is located in the left side of chest, pressure-like, moderate, nonradiating. He states it seems to be worse with even slight movement. He has left-sided chest wall tenderness on palpation.  He has mild shortness, no cough.  He denies fever, but his body temperature is 100.4 in ED. He also has chronic bilateral lower leg pain, leg edema and oozing lesions in both legs. He has been following at the wound clinic, and started on Bactrim on 01/16/2019 without significant improvement. He states when he was seen last week he was told he needed to go to the hospital for IV antibiotics.  Patient has nausea, but no vomiting, diarrhea or abdominal pain no symptoms of UTI or unilateral weakness.  ED Course: pt was found to have troponin XX 5, lactic acid 2.4, INR 2.9, WBC 7.2, pending COVID-19 test, electrolytes renal function okay, temperature 100.4, blood pressure 151/74, heart rate 77, oxygen saturation 92% on room air.  Chest x-ray negative.  Patient is admitted to telemetry bed as inpatient.  Review of Systems:   General: no subjective fevers, no chills, no body weight gain, has poor appetite, has fatigue HEENT: no blurry vision, hearing changes or sore throat Respiratory: has dyspnea, no coughing, wheezing CV: no chest pain, no palpitations GI: has nausea, no vomiting,  abdominal pain, diarrhea, constipation GU: no dysuria, burning on urination, increased urinary frequency, hematuria  Ext: has leg edema Neuro: no unilateral weakness, numbness, or tingling, no vision change or hearing loss Skin: has skin lesions in both legs MSK: No muscle spasm, no deformity, no limitation of range of movement in spin Heme: No easy bruising.  Travel history: No recent long distant travel.  Allergy: No Known Allergies  Past Medical History:  Diagnosis Date  . Asthma   . Brain damage   . Chronic pain of both knees 07/13/2018  . COPD (chronic obstructive pulmonary disease) ([place])   . Depression   . HFrEF (heart failure with reduced ejection fraction) ([place])    a. 03/2018 Echo: EF 25-30%, diff HK. Mod dil LA.  [name] Hypertension   . MI (myocardial infarction) ([place])   . Neck pain 07/13/2018  . Persistent atrial fibrillation ([place])    a. 03/2018 s/p DCCV; b. CHA2DS2VASc = 1-->Xarelto; c. 05/2018 recurrent afib-->Amio initiated;   . Sleep apnea     Past Surgical History:  Procedure Laterality Date  . CARDIOVERSION N/A 03/24/2018   Procedure: CARDIOVERSION;  Surgeon: Minna Merritts, MD;  Location: [facility] ORS;  Service: Cardiovascular;  Laterality: N/A;  . CARDIOVERSION N/A 08/08/2018   Procedure: CARDIOVERSION;  Surgeon: Nelva Bush, MD;  Location: [facility] ORS;  Service: Cardiovascular;  Laterality: N/A;  . hearnia repair     X 3- total of two surgeries  . LEG SURGERY      Social History:  reports that he has never smoked. He has never used  smokeless tobacco. He reports that he does not drink alcohol or use drugs.  Family History:  Family History  Problem Relation Age of Onset  . Heart failure Mother   . Lung cancer Mother   . Lung cancer Father   . Heart attack Maternal Grandmother   . Heart attack Maternal Grandfather      Prior to Admission medications   Medication Sig Start Date End Date Taking? Authorizing Provider  albuterol (VENTOLIN HFA) 108 (90 Base)  MCG/ACT inhaler Inhale 2 puffs into the lungs every 6 (six) hours as needed for wheezing or shortness of breath. 07/04/18   Johnson, Megan P, DO  allopurinol (ZYLOPRIM) 100 MG tablet TAKE 1 TABLET BY MOUTH ONCE DAILY 01/17/19   Wynetta Emery, Megan P, DO  amiodarone (PACERONE) 200 MG tablet Take 1 tablet (200 mg total) by mouth daily. 08/11/18   Epifanio Lesches, MD  atorvastatin (LIPITOR) 40 MG tablet TAKE 1 TABLET BY MOUTH ONCE DAILY Patient taking differently: Take 40 mg by mouth daily.  09/08/18   Johnson, Megan P, DO  DULoxetine (CYMBALTA) 20 MG capsule Take 20 mg by mouth daily.    [provider]  ferrous sulfate 325 (65 FE) MG tablet Take 1 tablet (325 mg total) by mouth 2 (two) times daily with a meal. 01/05/19   Johnson, Megan P, DO  hydrocortisone (ANUSOL-HC) 25 MG suppository Place 1 suppository (25 mg total) rectally 2 (two) times daily. 11/28/18   Park Liter P, DO  levothyroxine (SYNTHROID) 50 MCG tablet Take 1 tablet (50 mcg total) by mouth daily before breakfast. 09/19/18   Johnson, Megan P, DO  lisinopril (ZESTRIL) 2.5 MG tablet TAKE 1 TABLET BY MOUTH ONCE DAILY 01/02/19   Johnson, Megan P, DO  meloxicam (MOBIC) 15 MG tablet Take 1 tablet (15 mg total) by mouth daily. 07/04/18   Johnson, Megan P, DO  metoprolol succinate (TOPROL-XL) 50 MG 24 hr tablet Take 50 mg by mouth 2 (two) times daily. Take with or immediately following a meal.    [provider]  nitroGLYCERIN (NITROSTAT) 0.4 MG SL tablet Place 1 tablet (0.4 mg total) under the tongue every 5 (five) minutes as needed for chest pain. 01/18/18   Dustin Flock, MD  nystatin-triamcinolone ointment [facility]) Apply 1 application topically 2 (two) times daily. 12/25/18   Billey Co, MD  spironolactone (ALDACTONE) 25 MG tablet TAKE 1 TABLET BY MOUTH ONCE DAILY 01/17/19   Park Liter P, DO  sulfamethoxazole-trimethoprim (BACTRIM DS) 800-160 MG tablet Take 1 tablet by mouth 2 (two) times daily. 01/16/19   Johnson,  Megan P, DO  torsemide (DEMADEX) 20 MG tablet Take 2 tablets (40 mg total) by mouth 2 (two) times daily. 01/17/19   End, Harrell Gave, MD  traZODone (DESYREL) 150 MG tablet TAKE 1 TABLET BY MOUTH AT BEDTIME AS NEEDED SLEEP 10/30/18   Park Liter P, DO  warfarin (COUMADIN) 5 MG tablet Take 3 tablets (15 mg total) by mouth daily. Patient taking differently: Take 10 mg by mouth daily.  11/13/18 11/13/19  [name], DO    Physical Exam: Vitals:   01/21/19 1210 01/21/19 1230 01/21/19 1430 01/21/19 1616  BP: (!) 151/73 (!) 151/73 (!) 149/68 (!) 167/114  Pulse: 77 78 72 70  Resp: 19 (!) 22 (!) 24 16  Temp: (!) 100.4 F (38 C)   99.1 F (37.3 C)  TempSrc: Oral   Oral  SpO2: 98% 98% 100% 100%  Weight:      Height:  General: Not in acute distress HEENT:       Eyes: PERRL, EOMI, no scleral icterus.       ENT: No discharge from the ears and nose, no pharynx injection, no tonsillar enlargement.        Neck: No JVD, no bruit, no mass felt. Heme: No neck lymph node enlargement. Cardiac: S1/S2, RRR, No murmurs, No gallops or rubs. Chest wall: has left-sided chest wall tenderness Respiratory: No rales, wheezing, rhonchi or rubs. GI: Soft, nondistended, nontender, no rebound pain, no organomegaly, BS present. GU: No hematuria Ext: 3+ pitting leg edema bilaterally. 2+DP/PT pulse bilaterally. Musculoskeletal: No joint deformities, No joint redness or warmth, no limitation of ROM in spin. Skin: has wound in bilateral lower legs, left lower extremity wound greater than right with purulent drainage coming from both wounds. Neuro: Alert, oriented X3, cranial nerves II-XII grossly intact, moves all extremities normally.  Psych: Patient is not psychotic, no suicidal or hemocidal ideation.  Labs on Admission: I have personally reviewed following labs and imaging studies  CBC: Recent Labs  Lab 01/16/19 0850 01/21/19 1220  WBC 7.6 7.2  NEUTROABS 5.6 5.3  HGB 11.7* 10.9*  HCT 34.5* 33.5*   MCV 93 92.5  PLT 342 030   Basic Metabolic Panel: Recent Labs  Lab 01/21/19 1220  NA 135  K 3.9  CL 99  CO2 24  GLUCOSE 115*  BUN 17  CREATININE 1.14  CALCIUM 8.4*   GFR: Estimated Creatinine Clearance: 108.5 mL/min (by C-G formula based on SCr of 1.14 mg/dL). Liver Function Tests: No results for input(s): AST, ALT, ALKPHOS, BILITOT, PROT, ALBUMIN in the last 168 hours. No results for input(s): LIPASE, AMYLASE in the last 168 hours. No results for input(s): AMMONIA in the last 168 hours. Coagulation Profile: Recent Labs  Lab 01/16/19 0850 01/21/19 1220  INR 1.3* 2.9*   Cardiac Enzymes: No results for input(s): CKTOTAL, CKMB, CKMBINDEX, TROPONINI in the last 168 hours. BNP (last 3 results) No results for input(s): PROBNP in the last 8760 hours. HbA1C: No results for input(s): HGBA1C in the last 72 hours. CBG: No results for input(s): GLUCAP in the last 168 hours. Lipid Profile: No results for input(s): CHOL, HDL, LDLCALC, TRIG, CHOLHDL, LDLDIRECT in the last 72 hours. Thyroid Function Tests: No results for input(s): TSH, T4TOTAL, FREET4, T3FREE, THYROIDAB in the last 72 hours. Anemia Panel: No results for input(s): VITAMINB12, FOLATE, FERRITIN, TIBC, IRON, RETICCTPCT in the last 72 hours. Urine analysis:    Component Value Date/Time   COLORURINE YELLOW (A) 01/21/2019 1221   APPEARANCEUR HAZY (A) 01/21/2019 1221   APPEARANCEUR Hazy (A) 12/25/2018 1442   LABSPEC 1.027 01/21/2019 1221   PHURINE 5.0 01/21/2019 1221   GLUCOSEU NEGATIVE 01/21/2019 1221   HGBUR NEGATIVE 01/21/2019 1221   BILIRUBINUR NEGATIVE 01/21/2019 1221   BILIRUBINUR Negative 12/25/2018 1442   KETONESUR NEGATIVE 01/21/2019 1221   PROTEINUR 30 (A) 01/21/2019 1221   NITRITE NEGATIVE 01/21/2019 1221   LEUKOCYTESUR TRACE (A) 01/21/2019 1221   Sepsis Labs: @LABRCNTIP (procalcitonin:4,lacticidven:4) ) Recent Results (from the past 240 hour(s))  Aerobic Culture (superficial specimen)     Status:  Abnormal   Collection Time: 01/18/19 10:52 AM   Specimen: Leg  Result Value Ref Range Status   Specimen Description LEG LEFT  Final   Special Requests NONE  Final   Gram Stain   Final    WBC PRESENT, PREDOMINANTLY MONONUCLEAR MODERATE GRAM NEGATIVE RODS MODERATE GRAM POSITIVE COCCI Performed at [facility], [place]  [address], [place], [place] [address]    Culture MULTIPLE ORGANISMS PRESENT, NONE PREDOMINANT (A)  Final   Report Status 01/21/2019 FINAL  Final     Radiological Exams on Admission: Le Doppler In [facility] (US Venous Img Lower Bilateral (dvt)  Result Date: 01/21/2019 CLINICAL DATA:  Cellulitis, pain and edema EXAM: BILATERAL LOWER EXTREMITY VENOUS DOPPLER ULTRASOUND TECHNIQUE: Gray-scale sonography with graded compression, as well as color Doppler and duplex ultrasound were performed to evaluate the lower extremity deep venous systems from the level of the common femoral vein and including the common femoral, femoral, profunda femoral, popliteal and calf veins including the posterior tibial, peroneal and gastrocnemius veins when visible. The superficial great saphenous vein was also interrogated. Spectral Doppler was utilized to evaluate flow at rest and with distal augmentation maneuvers in the common femoral, femoral and popliteal veins. COMPARISON:  None. FINDINGS: RIGHT LOWER EXTREMITY Common Femoral Vein: No evidence of thrombus. Normal compressibility, respiratory phasicity and response to augmentation. Saphenofemoral Junction: No evidence of thrombus. Normal compressibility and flow on color Doppler imaging. Profunda Femoral Vein: No evidence of thrombus. Normal compressibility and flow on color Doppler imaging. Femoral Vein: No evidence of thrombus. Normal compressibility, respiratory phasicity and response to augmentation. Popliteal Vein: Limited due to patient body habitus. Given this limitation, no evidence of thrombus. Normal compressibility, respiratory phasicity and  response to augmentation. Calf Veins: Limited due to patient body habitus. Given this limitation, no evidence of thrombus. Normal compressibility and flow on color Doppler imaging. Superficial Great Saphenous Vein: No evidence of thrombus. Normal compressibility. Venous Reflux:  None. Other Findings:  None. LEFT LOWER EXTREMITY Common Femoral Vein: No evidence of thrombus. Normal compressibility, respiratory phasicity and response to augmentation. Saphenofemoral Junction: No evidence of thrombus. Normal compressibility and flow on color Doppler imaging. Profunda Femoral Vein: No evidence of thrombus. Normal compressibility and flow on color Doppler imaging. Femoral Vein: No evidence of thrombus. Normal compressibility, respiratory phasicity and response to augmentation. Popliteal Vein: Limited due to patient body habitus. Given this limitation, no evidence of thrombus. Normal compressibility, respiratory phasicity and response to augmentation. Calf Veins: Limited due to patient body habitus. Given this limitation, no evidence of thrombus. Normal compressibility and flow on color Doppler imaging. Superficial Great Saphenous Vein: No evidence of thrombus. Normal compressibility. Venous Reflux:  None. Other Findings:  None. IMPRESSION: No evidence of deep venous thrombosis in either lower extremity. Electronically Signed   By: Zerita Boers M.D.   On: 01/21/2019 17:40   Dg Chest Portable 1 View  Result Date: 01/21/2019 CLINICAL DATA:  Chest pain. EXAM: PORTABLE CHEST 1 VIEW COMPARISON:  10/23/2018 FINDINGS: Stable cardiac enlargement. No pleural effusion or edema. No airspace opacities identified. Osseous structures are unremarkable. IMPRESSION: No acute cardiopulmonary abnormalities. Electronically Signed   By: Kerby Moors M.D.   On: 01/21/2019 13:13     EKG: Independently reviewed.  QTc 469, poor R wave progression, low voltage, old left bundle blockade, anteroseptal infarction pattern.    Assessment/Plan Principal Problem:   Cellulitis of lower extremity Active Problems:   Chronic systolic heart failure (HCC)   COPD (chronic obstructive pulmonary disease) (HCC)   Obstructive sleep apnea   Persistent atrial fibrillation (HCC)   HTN (hypertension)   Gout   Personal history of DVT (deep vein thrombosis)   Chronic atrial fibrillation (HCC)   Chest pain   Depression, major, single episode, moderate (HCC)   PE (pulmonary thromboembolism) (HCC)   Open leg wound   HLD (hyperlipidemia)   CAD (  coronary artery disease)   Cellulitis of lower extremity: Patient failed outpatient antibiotic treatment.  Does not meet criteria for sepsis, but patient has elevated lactic acid 2.4.  - will admit to tele bed as inpt - Empiric antimicrobial treatment with vancomycin and Rocephin - Blood cultures x 2  - ESR and CRP - wound care consult - will get Procalcitonin and trend lactic acid levels per sepsis protocol. - IVF: 500 ml of RL, followed by 75 cc/h - LE doppler to r/o DVT  Chronic systolic heart failure ([place]): stable.  2D echo on 03/24/2018 showed EF 25-30% -Continue home torsemide, spironolactone, metoprolol, lisinopril  COPD (chronic obstructive pulmonary disease) ([place]): stable -Bronchodilators  Obstructive sleep apnea: -CPAP  Persistent atrial fibrillation ([place]): -Continue Coumadin per pharm dosing -Continue metoprolol and amiodarone  HTN (hypertension): Blood pressure 151/73 -on torsemide, spironolactone, metoprolol, lisinopril  Gout: -Allopurinol  Personal history of DVT (deep vein thrombosis) and PE: INR 2.9 on coumadin -on coumadin.  Chest pain and elevated trop and hx of CAD: Patient has a chronically elevated troponin, baseline troponin XVIII-31 recently.  He is troponin is a 25 which is at baseline.  Patient has a tenderness in the left side of her chest wall, indicating possible musculoskeletal pain. -percocet prn - prn nitroglycerin -trend trop -check  A1c and FLP, UDS  Depression, major, single episode, moderate ([place]): -continue home meds  HLD (hyperlipidemia): -lipitor    Inpatient status:  # Patient requires inpatient status due to high intensity of service, high risk for further deterioration and high frequency of surveillance required.  I certify that at the point of admission it is my clinical judgment that the patient will require inpatient hospital care spanning beyond 2 midnights from the point of admission.  . This patient has multiple chronic comorbidities including significant of hypertension, hyperlipidemia, diet-controlled diabetes, COPD, hypothyroidism, depression, OSA on CPAP, atrial fibrillation on Coumadin, CAD, dCHF, PE/DVT on Coumadin . Now patient has presenting with bilateral lower leg cellulitis, chest pain, elevated troponin . The worrisome physical exam findings include 3+ bilateral leg edema. Has wound in bilateral lower legs, left lower extremity wound greater than right with purulent drainage coming from both wounds. . The initial radiographic and laboratory data are worrisome because of elevated troponin, elevated lactic acid . Current medical needs: please see my assessment and plan . Predictability of an adverse outcome (risk): Patient has multiple complicated comorbidities as listed above, now presents with bilateral lower leg cellulitis, failed outpatient antibiotic treatment.  Patient also has chest pain and elevated troponin.  His presentation is highly complicated.  Patient is at high risk of deteriorating.  Will need to be treated in hospital for at least 2 days.     DVT ppx: on Coumdin Code Status: Full code Family Communication: None at bed side.      Disposition Plan:  Anticipate discharge back to previous home environment Consults called:  none Admission status: Inpatient/tele    Date of Service 01/21/2019    Ivor Costa [facility]   If 7PM-7AM, please contact night-coverage  [email or web address] Password [facility] 01/21/2019, 6:29 PM

## 2019-01-21 NOTE — ED Notes (Addendum)
Nonskid socks and hospital gown applied to pt.

## 2019-01-21 NOTE — ED Notes (Signed)
Pt provided supper tray.  

## 2019-01-21 NOTE — Progress Notes (Signed)
CODE SEPSIS - PHARMACY COMMUNICATION  **Broad Spectrum Antibiotics should be administered within 1 hour of Sepsis diagnosis**  Time Code Sepsis Called/Page Received: 12/6 @ 1533   Antibiotics Ordered:  Ceftriaxone, Vancomycin   Time of 1st antibiotic administration:   Ceftriaxone 2 gm IV X 1 given on 12/6 @ 1229   Additional action taken by pharmacy:   If necessary, Name of Provider/Nurse Contacted:     Chrislyn Seedorf D ,PharmD Clinical Pharmacist  01/21/2019  3:47 PM

## 2019-01-21 NOTE — Progress Notes (Signed)
Pharmacy Antibiotic Note  [name] [name] is a 58 y.o. male admitted on 01/21/2019 with cellulitis.  Pharmacy has been consulted for Vancomycin dosing.  Plan: Vancomycin 1 gm IV X 1 given on 12/6 @ 1312. Vancomycin 1500 mg IV X 1 ordered to be given following initial 1 gm dose to make total loading dose of 2500 mg. Vancomycin 2500 mg IV Q24H ordered to continue on 12/7 @ 1300. No peak or trough currently ordered.   AUC = 484.1 Vd = 84.5 L  ke = 0.061 hr-1 T1/2 = 11.3 hrs Vanc trough = 10.3 mcg/mL   Height: 5\' 8"  (172.7 cm) Weight: (!) 372 lb 9.2 oz (169 kg) IBW/kg (Calculated) : 68.4  Temp (24hrs), Avg:100.4 F (38 C), Min:100.4 F (38 C), Max:100.4 F (38 C)  Recent Labs  Lab 01/16/19 0850 01/21/19 1220 01/21/19 1421  WBC 7.6 7.2  --   CREATININE  --  1.14  --   LATICACIDVEN  --  2.4* 2.1*    Estimated Creatinine Clearance: 108.5 mL/min (by C-G formula based on SCr of 1.14 mg/dL).    No Known Allergies  Antimicrobials this admission:   >>    >>   Dose adjustments this admission:   Microbiology results:  BCx:   UCx:    Sputum:    MRSA PCR:   Thank you for allowing pharmacy to be a part of this patient's care.  [name] [name] D 01/21/2019 3:52 PM

## 2019-01-21 NOTE — Progress Notes (Signed)
PHARMACY -  BRIEF ANTIBIOTIC NOTE   Pharmacy has received consult(s) for Vancomycin from an ED provider.  The patient's profile has been reviewed for ht/wt/allergies/indication/available labs.    One time order(s) placed for Vancomycin and Ceftriaxone by ED provider  Further antibiotics/pharmacy consults should be ordered by admitting physician if indicated.                       Thank you, Vira Blanco 01/21/2019  12:33 PM

## 2019-01-21 NOTE — ED Triage Notes (Signed)
Pt here for left sided chest pain with SHOB. Hx MI. Took 650mg  ASA at home. + nausea

## 2019-01-21 NOTE — ED Notes (Signed)
Pt provided ice and water per request.

## 2019-01-21 NOTE — ED Notes (Signed)
US at bedside

## 2019-01-21 NOTE — ED Notes (Signed)
Pt provided basin for UA specimen per request. Pt provided call light. Pt educated to avoid using scratcher device from home on leg wounds due to potential for infection.

## 2019-01-21 NOTE — ED Notes (Signed)
Pt unable to keep arm straight despite persistent reminders- armboard applied so vancomycin can finish infusing.

## 2019-01-22 DIAGNOSIS — I890 Lymphedema, not elsewhere classified: Secondary | ICD-10-CM | POA: Diagnosis not present

## 2019-01-22 DIAGNOSIS — I252 Old myocardial infarction: Secondary | ICD-10-CM | POA: Diagnosis not present

## 2019-01-22 DIAGNOSIS — I110 Hypertensive heart disease with heart failure: Secondary | ICD-10-CM | POA: Diagnosis present

## 2019-01-22 DIAGNOSIS — I2510 Atherosclerotic heart disease of native coronary artery without angina pectoris: Secondary | ICD-10-CM | POA: Diagnosis present

## 2019-01-22 DIAGNOSIS — I420 Dilated cardiomyopathy: Secondary | ICD-10-CM | POA: Diagnosis present

## 2019-01-22 DIAGNOSIS — L03115 Cellulitis of right lower limb: Secondary | ICD-10-CM | POA: Diagnosis not present

## 2019-01-22 DIAGNOSIS — E039 Hypothyroidism, unspecified: Secondary | ICD-10-CM | POA: Diagnosis present

## 2019-01-22 DIAGNOSIS — F321 Major depressive disorder, single episode, moderate: Secondary | ICD-10-CM | POA: Diagnosis present

## 2019-01-22 DIAGNOSIS — I447 Left bundle-branch block, unspecified: Secondary | ICD-10-CM | POA: Diagnosis present

## 2019-01-22 DIAGNOSIS — S069X9S Unspecified intracranial injury with loss of consciousness of unspecified duration, sequela: Secondary | ICD-10-CM | POA: Diagnosis not present

## 2019-01-22 DIAGNOSIS — E119 Type 2 diabetes mellitus without complications: Secondary | ICD-10-CM | POA: Diagnosis present

## 2019-01-22 DIAGNOSIS — R600 Localized edema: Secondary | ICD-10-CM | POA: Diagnosis not present

## 2019-01-22 DIAGNOSIS — R079 Chest pain, unspecified: Secondary | ICD-10-CM | POA: Diagnosis not present

## 2019-01-22 DIAGNOSIS — M109 Gout, unspecified: Secondary | ICD-10-CM | POA: Diagnosis present

## 2019-01-22 DIAGNOSIS — Z6841 Body Mass Index (BMI) 40.0 and over, adult: Secondary | ICD-10-CM | POA: Diagnosis not present

## 2019-01-22 DIAGNOSIS — D6869 Other thrombophilia: Secondary | ICD-10-CM | POA: Diagnosis present

## 2019-01-22 DIAGNOSIS — I10 Essential (primary) hypertension: Secondary | ICD-10-CM | POA: Diagnosis not present

## 2019-01-22 DIAGNOSIS — R0789 Other chest pain: Secondary | ICD-10-CM | POA: Diagnosis not present

## 2019-01-22 DIAGNOSIS — L03116 Cellulitis of left lower limb: Secondary | ICD-10-CM | POA: Diagnosis not present

## 2019-01-22 DIAGNOSIS — G8929 Other chronic pain: Secondary | ICD-10-CM | POA: Diagnosis present

## 2019-01-22 DIAGNOSIS — A419 Sepsis, unspecified organism: Secondary | ICD-10-CM | POA: Diagnosis not present

## 2019-01-22 DIAGNOSIS — S81809A Unspecified open wound, unspecified lower leg, initial encounter: Secondary | ICD-10-CM | POA: Diagnosis not present

## 2019-01-22 DIAGNOSIS — L0390 Cellulitis, unspecified: Secondary | ICD-10-CM | POA: Diagnosis present

## 2019-01-22 DIAGNOSIS — I5022 Chronic systolic (congestive) heart failure: Secondary | ICD-10-CM | POA: Diagnosis not present

## 2019-01-22 DIAGNOSIS — Z20828 Contact with and (suspected) exposure to other viral communicable diseases: Secondary | ICD-10-CM | POA: Diagnosis not present

## 2019-01-22 DIAGNOSIS — I4819 Other persistent atrial fibrillation: Secondary | ICD-10-CM | POA: Diagnosis not present

## 2019-01-22 DIAGNOSIS — M1A9XX0 Chronic gout, unspecified, without tophus (tophi): Secondary | ICD-10-CM | POA: Diagnosis not present

## 2019-01-22 DIAGNOSIS — I482 Chronic atrial fibrillation, unspecified: Secondary | ICD-10-CM | POA: Diagnosis not present

## 2019-01-22 DIAGNOSIS — E785 Hyperlipidemia, unspecified: Secondary | ICD-10-CM | POA: Diagnosis present

## 2019-01-22 DIAGNOSIS — I878 Other specified disorders of veins: Secondary | ICD-10-CM | POA: Diagnosis present

## 2019-01-22 DIAGNOSIS — J449 Chronic obstructive pulmonary disease, unspecified: Secondary | ICD-10-CM | POA: Diagnosis present

## 2019-01-22 DIAGNOSIS — G4733 Obstructive sleep apnea (adult) (pediatric): Secondary | ICD-10-CM | POA: Diagnosis not present

## 2019-01-22 DIAGNOSIS — I5042 Chronic combined systolic (congestive) and diastolic (congestive) heart failure: Secondary | ICD-10-CM | POA: Diagnosis present

## 2019-01-22 DIAGNOSIS — J410 Simple chronic bronchitis: Secondary | ICD-10-CM | POA: Diagnosis not present

## 2019-01-22 DIAGNOSIS — Z86718 Personal history of other venous thrombosis and embolism: Secondary | ICD-10-CM | POA: Diagnosis not present

## 2019-01-22 DIAGNOSIS — L03119 Cellulitis of unspecified part of limb: Secondary | ICD-10-CM | POA: Diagnosis not present

## 2019-01-22 LAB — LIPID PANEL
Cholesterol: 153 mg/dL (ref 0–200)
HDL: 48 mg/dL (ref >40)
LDL Cholesterol: 80 mg/dL (ref 0–99)
Total CHOL/HDL Ratio: 3.2 RATIO
Triglycerides: 125 mg/dL (ref <150)
VLDL: 25 mg/dL (ref 0–40)

## 2019-01-22 LAB — HIV ANTIBODY (ROUTINE TESTING W REFLEX): HIV Screen 4th Generation wRfx: NONREACTIVE

## 2019-01-22 LAB — HEMOGLOBIN A1C
Hgb A1c MFr Bld: 6.3 % — ABNORMAL HIGH (ref 4.8–5.6)
Mean Plasma Glucose: 134.11 mg/dL

## 2019-01-22 LAB — URINE CULTURE: Culture: <10000 — AB

## 2019-01-22 LAB — PROTIME-INR
INR: 2.7 — ABNORMAL HIGH (ref 0.8–1.2)
Prothrombin Time: 28.6 seconds — ABNORMAL HIGH (ref 11.4–15.2)

## 2019-01-22 LAB — TSH: TSH: 13.657 u[IU]/mL — ABNORMAL HIGH (ref 0.350–4.500)

## 2019-01-22 LAB — C-REACTIVE PROTEIN: CRP: 2.3 mg/dL — ABNORMAL HIGH (ref <1.0)

## 2019-01-22 MED ORDER — VANCOMYCIN HCL 1.25 G IV SOLR
1250.0000 mg | Freq: Two times a day (BID) | INTRAVENOUS | Status: DC
  Administered 2019-01-22: 1250 mg via INTRAVENOUS
  Filled 2019-01-22 (×2): qty 1250

## 2019-01-22 MED ORDER — RISAQUAD PO CAPS
1.0000 | ORAL_CAPSULE | Freq: Two times a day (BID) | ORAL | Status: DC
  Administered 2019-01-22 – 2019-01-27 (×11): 1 via ORAL
  Filled 2019-01-22 (×13): qty 1

## 2019-01-22 MED ORDER — CLINDAMYCIN PHOSPHATE 900 MG/50ML IV SOLN
900.0000 mg | Freq: Three times a day (TID) | INTRAVENOUS | Status: DC
  Administered 2019-01-22 – 2019-01-26 (×12): 900 mg via INTRAVENOUS
  Filled 2019-01-22 (×15): qty 50

## 2019-01-22 NOTE — ED Notes (Signed)
Patient assisted to the bed from the recliner.

## 2019-01-22 NOTE — Progress Notes (Signed)
Brief history: [name] [name] is a 58 y.o. male with medical history significant of hypertension, hyperlipidemia, diet-controlled diabetes, COPD, hypothyroidism, depression, OSA on CPAP, atrial fibrillation on Coumadin, CAD, dCHF, PE/DVT on Coumadin, who presents with bilateral leg pain and chest pain and chronic bilateral lower leg pain, leg edema and oozing lesions in both legs. He has been following at the wound clinic, and started on Bactrim on 01/16/2019 without significant improvement. He states when he was seen last week he was told he needed to go to the hospital for IV antibiotics.  Patient has nausea, but no vomiting, diarrhea or abdominal pain no symptoms of UTI or unilateral weakness. ED Course: pt was found to have troponin XX 5, lactic acid 2.4, INR 2.9, WBC 7.2, pending COVID-19 test, electrolytes renal function okay, temperature 100.4, blood pressure 151/74, heart rate 77, oxygen saturation 92% on room air.  Chest x-ray negative.  Patient is admitted to telemetry bed as inpatient.   Subjective: Says he was having discomfort bilaterally legs.  Denies any chest pain or shortness of breath.  He also denies any fever.  His Covid test came back negative.  Objective: Vital signs in last 24 hours: Temp:  [98.6 F (37 C)-99.3 F (37.4 C)] 98.6 F (37 C) (12/07 0509) Pulse Rate:  [65-81] 65 (12/07 1107) Resp:  [14-24] 17 (12/07 1107) BP: (95-182)/(41-117) 103/41 (12/07 1107) SpO2:  [93 %-100 %] 98 % (12/07 1107)  Intake/Output from previous day: 12/06 0701 - 12/07 0700 In: 2280  Out: -  Intake/Output this shift: No intake/output data recorded.  General: Not in acute distress HEENT:       Eyes: PERRL, EOMI, no scleral icterus.       ENT: No discharge from the ears and nose, no pharynx injection, no tonsillar enlargement.        Neck: No JVD, no bruit, no mass felt. Heme: No neck lymph node enlargement. Cardiac: S1/S2, RRR, No murmurs, No gallops or rubs. Respiratory: No rales,  wheezing, rhonchi or rubs. GI: Soft, nondistended, nontender, no rebound pain, no organomegaly, BS present. GU: No hematuria Ext: 2-3+ pitting leg edema bilaterally. 2+DP/PT pulse bilaterally. Musculoskeletal: No joint deformities, No joint redness or warmth, no limitation of ROM in spin. Skin: has wound in bilateral lower legs, left lower extremity wound greater than right with some serous and mucopurulent drainage coming from both wounds. Neuro: Alert, oriented X3, cranial nerves II-XII grossly intact, moves all extremities normally.  Psych: Patient is not psychotic, no suicidal or hemocidal ideation.  Results for orders placed or performed during the hospital encounter of 01/21/19 (from the past 24 hour(s))  POC SARS Coronavirus 2 Ag     Status: None   Collection Time: 01/21/19 12:58 PM  Result Value Ref Range   SARS Coronavirus 2 Ag NEGATIVE NEGATIVE  SARS CORONAVIRUS 2 (TAT 6-24 HRS) Nasopharyngeal Nasopharyngeal Swab     Status: None   Collection Time: 01/21/19  1:17 PM   Specimen: Nasopharyngeal Swab  Result Value Ref Range   SARS Coronavirus 2 NEGATIVE NEGATIVE  Lactic acid, plasma     Status: Abnormal   Collection Time: 01/21/19  2:21 PM  Result Value Ref Range   Lactic Acid, Venous 2.1 (HH) 0.5 - 1.9 mmol/L  Troponin I (High Sensitivity)     Status: Abnormal   Collection Time: 01/21/19  2:21 PM  Result Value Ref Range   Troponin I (High Sensitivity) 31 (H) <18 ng/L  APTT     Status: Abnormal  Collection Time: 01/21/19  4:01 PM  Result Value Ref Range   aPTT 39 (H) 24 - 36 seconds  Troponin I (High Sensitivity)     Status: Abnormal   Collection Time: 01/21/19  4:01 PM  Result Value Ref Range   Troponin I (High Sensitivity) 32 (H) <18 ng/L  HIV Antibody (routine testing w rflx)     Status: None   Collection Time: 01/21/19  4:01 PM  Result Value Ref Range   HIV Screen 4th Generation wRfx NON REACTIVE NON REACTIVE  esr     Status: Abnormal   Collection Time: 01/21/19   4:01 PM  Result Value Ref Range   Sed Rate 53 (H) 0 - 20 mm/hr  CRP     Status: Abnormal   Collection Time: 01/21/19  4:01 PM  Result Value Ref Range   CRP 2.3 (H) <1.0 mg/dL  Lactic acid, plasma     Status: Abnormal   Collection Time: 01/21/19  4:01 PM  Result Value Ref Range   Lactic Acid, Venous 2.2 (HH) 0.5 - 1.9 mmol/L  Procalcitonin     Status: None   Collection Time: 01/21/19  4:01 PM  Result Value Ref Range   Procalcitonin <0.10 ng/mL  Lactic acid, plasma     Status: Abnormal   Collection Time: 01/21/19  5:40 PM  Result Value Ref Range   Lactic Acid, Venous 2.4 (HH) 0.5 - 1.9 mmol/L  Troponin I (High Sensitivity)     Status: Abnormal   Collection Time: 01/21/19  6:23 PM  Result Value Ref Range   Troponin I (High Sensitivity) 30 (H) <18 ng/L  Troponin I (High Sensitivity)     Status: Abnormal   Collection Time: 01/21/19  9:26 PM  Result Value Ref Range   Troponin I (High Sensitivity) 26 (H) <18 ng/L  TSH AM     Status: Abnormal   Collection Time: 01/22/19  3:46 AM  Result Value Ref Range   TSH 13.657 (H) 0.350 - 4.500 uIU/mL  AM - HgA1c     Status: Abnormal   Collection Time: 01/22/19  3:46 AM  Result Value Ref Range   Hgb A1c MFr Bld 6.3 (H) 4.8 - 5.6 %   Mean Plasma Glucose 134.11 mg/dL  AM - FLP     Status: None   Collection Time: 01/22/19  3:46 AM  Result Value Ref Range   Cholesterol 153 0 - 200 mg/dL   Triglycerides 125 <150 mg/dL   HDL 48 >40 mg/dL   Total CHOL/HDL Ratio 3.2 RATIO   VLDL 25 0 - 40 mg/dL   LDL Cholesterol 80 0 - 99 mg/dL  Protime-INR     Status: Abnormal   Collection Time: 01/22/19  3:46 AM  Result Value Ref Range   Prothrombin Time 28.6 (H) 11.4 - 15.2 seconds   INR 2.7 (H) 0.8 - 1.2    Studies/Results: Le Doppler In [place] (US Venous Img Lower Bilateral (dvt)  Result Date: 01/21/2019 CLINICAL DATA:  Cellulitis, pain and edema EXAM: BILATERAL LOWER EXTREMITY VENOUS DOPPLER ULTRASOUND TECHNIQUE: Gray-scale sonography with graded  compression, as well as color Doppler and duplex ultrasound were performed to evaluate the lower extremity deep venous systems from the level of the common femoral vein and including the common femoral, femoral, profunda femoral, popliteal and calf veins including the posterior tibial, peroneal and gastrocnemius veins when visible. The superficial great saphenous vein was also interrogated. Spectral Doppler was utilized to evaluate flow at rest and with distal augmentation  maneuvers in the common femoral, femoral and popliteal veins. COMPARISON:  None. FINDINGS: RIGHT LOWER EXTREMITY Common Femoral Vein: No evidence of thrombus. Normal compressibility, respiratory phasicity and response to augmentation. Saphenofemoral Junction: No evidence of thrombus. Normal compressibility and flow on color Doppler imaging. Profunda Femoral Vein: No evidence of thrombus. Normal compressibility and flow on color Doppler imaging. Femoral Vein: No evidence of thrombus. Normal compressibility, respiratory phasicity and response to augmentation. Popliteal Vein: Limited due to patient body habitus. Given this limitation, no evidence of thrombus. Normal compressibility, respiratory phasicity and response to augmentation. Calf Veins: Limited due to patient body habitus. Given this limitation, no evidence of thrombus. Normal compressibility and flow on color Doppler imaging. Superficial Great Saphenous Vein: No evidence of thrombus. Normal compressibility. Venous Reflux:  None. Other Findings:  None. LEFT LOWER EXTREMITY Common Femoral Vein: No evidence of thrombus. Normal compressibility, respiratory phasicity and response to augmentation. Saphenofemoral Junction: No evidence of thrombus. Normal compressibility and flow on color Doppler imaging. Profunda Femoral Vein: No evidence of thrombus. Normal compressibility and flow on color Doppler imaging. Femoral Vein: No evidence of thrombus. Normal compressibility, respiratory phasicity and  response to augmentation. Popliteal Vein: Limited due to patient body habitus. Given this limitation, no evidence of thrombus. Normal compressibility, respiratory phasicity and response to augmentation. Calf Veins: Limited due to patient body habitus. Given this limitation, no evidence of thrombus. Normal compressibility and flow on color Doppler imaging. Superficial Great Saphenous Vein: No evidence of thrombus. Normal compressibility. Venous Reflux:  None. Other Findings:  None. IMPRESSION: No evidence of deep venous thrombosis in either lower extremity. Electronically Signed   By: Zerita Boers M.D.   On: 01/21/2019 17:40   Dg Chest Portable 1 View  Result Date: 01/21/2019 CLINICAL DATA:  Chest pain. EXAM: PORTABLE CHEST 1 VIEW COMPARISON:  10/23/2018 FINDINGS: Stable cardiac enlargement. No pleural effusion or edema. No airspace opacities identified. Osseous structures are unremarkable. IMPRESSION: No acute cardiopulmonary abnormalities. Electronically Signed   By: Kerby Moors M.D.   On: 01/21/2019 13:13    Scheduled Meds: . allopurinol  100 mg Oral Daily  . amiodarone  200 mg Oral Daily  . atorvastatin  40 mg Oral Daily  . DULoxetine  20 mg Oral Daily  . ferrous sulfate  325 mg Oral BID WC  . hydrocortisone  25 mg Rectal BID  . levothyroxine  50 mcg Oral QAC breakfast  . lisinopril  2.5 mg Oral Daily  . metoprolol succinate  50 mg Oral BID  . nystatin-triamcinolone ointment  1 application Topical BID  . spironolactone  25 mg Oral Daily  . torsemide  40 mg Oral BID  . traZODone  150 mg Oral QHS  . warfarin  10 mg Oral q1800  . Warfarin - Pharmacist Dosing Inpatient   Does not apply q1800   Continuous Infusions: . cefTRIAXone (ROCEPHIN)  IV    . vancomycin Stopped (01/22/19 0906)   PRN Meds:acetaminophen, albuterol, hydrALAZINE, morphine injection, nitroGLYCERIN, ondansetron (ZOFRAN) IV, oxyCODONE-acetaminophen  Assessment/Plan: Cellulitis of lower extremity:  - Patient failed  outpatient antibiotic treatment.    -Was started on empiric antimicrobial treatment with vancomycin and Rocephin; may change to clindamycin - Blood cultures x 2 --pending - ESR and CRP mildly elevated - wound care consult - will get Procalcitonin and trend lactic acid levels per sepsis protocol. -That is post IVF of 500 ml of RL, followed by 75 cc/h; may discontinue IV hydration - LE doppler --no DVT -Wound care consulted and provided some  recommendation.  May need to follow by wound care.  - Appreciate wound care recs  Chronic systolic heart failure ([place]): stable no acute issue - 2D echo on 03/24/2018 showed EF 25-30% -Continue home torsemide, spironolactone, metoprolol, lisinopril  COPD (chronic obstructive pulmonary disease) ([place]): stable -Bronchodilators  Obstructive sleep apnea: -CPAP  Persistent atrial fibrillation ([place]): -Continue Coumadin per pharm dosing -Continue metoprolol and amiodarone  HTN (hypertension): Blood pressure stable -on torsemide, spironolactone, metoprolol, lisinopril -Hold if running low  Gout: -Allopurinol  Personal history of DVT (deep vein thrombosis) and PE: INR 2.9 on coumadin -on coumadin.  Chest pain and elevated trop the time of admission and hx of CAD: Patient has a chronically elevated troponin, baseline troponin XVIII-31 recently.  He is troponin is a 25 which is at baseline.  Patient has a tenderness in the left side of her chest wall, indicating possible musculoskeletal pain. -percocet prn - prn nitroglycerin -trend trop -A1c 6.3 , FLP: Normal  -To monitor  Depression, major, single episode, moderate ([place]): No acute issue -continue home meds  HLD (hyperlipidemia): -lipitor   DVT ppx: on Coumdin Code Status: Full code   LOS: 0 days   Xachary Hambly Izetta Dakin

## 2019-01-22 NOTE — Progress Notes (Signed)
Pharmacy Antibiotic Note  [name] [name] is a 58 y.o. male admitted on 01/21/2019 with cellulitis.  Pharmacy has been consulted for Vancomycin dosing.  Plan: Vancomycin 1 gm IV X 1 given on 12/6 @ 1312. Vancomycin 1500 mg IV X 1 ordered to be given following initial 1 gm dose to make total loading dose of 2500 mg.  Vancomycin  mg IV Q 1250 hrs. Goal AUC 400-550. Expected AUC: 484 SCr used: 1.14 Css: 15    Height: 5\' 8"  (172.7 cm) Weight: (!) 372 lb 9.2 oz (169 kg) IBW/kg (Calculated) : 68.4  Temp (24hrs), Avg:99.6 F (37.6 C), Min:99.1 F (37.3 C), Max:100.4 F (38 C)  Recent Labs  Lab 01/16/19 0850 01/21/19 1220 01/21/19 1421 01/21/19 1601 01/21/19 1740  WBC 7.6 7.2  --   --   --   CREATININE  --  1.14  --   --   --   LATICACIDVEN  --  2.4* 2.1* 2.2* 2.4*    Estimated Creatinine Clearance: 108.5 mL/min (by C-G formula based on SCr of 1.14 mg/dL).    No Known Allergies  Antimicrobials this admission:  Ceftriaxone 12/6 >>   Vancomycin 12/6 >>   Microbiology results: 12/6 BCx: pending 12/6 JQG:BEEFEOF 12/6 SARS Coronavirus 2: negative    Thank you for allowing pharmacy to be a part of this patient's care.  Pernell Dupre, PharmD, BCPS Clinical Pharmacist 01/22/2019 12:31 AM

## 2019-01-22 NOTE — Consult Note (Signed)
WOC Nurse Consult Note: Patient receiving care in [facility] ED 31. Reason for Consult: "oozing skin lesions on both lower legs" Wound type: Related to venous stasis and lymphedema Measurement: LLL wound measures 8 cm x 13 cm and is 100% yellow slough.  The RLL wound measures 7 cm x 14 cm and is 100% yellow slough Wound bed: as above Drainage (amount, consistency, odor) heavy serous fluid Periwound: very edeamtous (L>R), erythematous Dressing procedure/placement/frequency:  Place at least 2 pieces of Aquacel Kellie Simmering [phone]) over BLE wounds. Top with ABD pads. Secure with kerlex from behind the toes to below the knees in a spiral wrap fashion.  Then top with 4 or 6 inch Ace Wraps from behind the toes to below the knees also in a spiral wrap fashion. Monitor the wound area(s) for worsening of condition such as: Signs/symptoms of infection,  Increase in size,  Development of or worsening of odor, Development of pain, or increased pain at the affected locations.  Notify the medical team if any of these develop.  Thank you for the consult.  Discussed plan of care with the patient and bedside nurse.  [place] nurse will not follow at this time.  Please re-consult the [place] team if needed.  Val Riles, RN, MSN, CWOCN, CNS-BC, pager [phone]

## 2019-01-22 NOTE — Progress Notes (Signed)
ANTICOAGULATION CONSULT NOTE   Pharmacy Consult for Warfarin  Indication: atrial fibrillation  No Known Allergies  Patient Measurements: Height: 5\' 8"  (172.7 cm) Weight: (!) 372 lb 9.2 oz (169 kg) IBW/kg (Calculated) : 68.4 Heparin Dosing Weight:   Vital Signs: Temp: 98.6 F (37 C) (12/07 0509) Temp Source: Axillary (12/07 0509) BP: 111/52 (12/07 0509) Pulse Rate: 75 (12/07 0509)  Labs: Recent Labs    01/21/19 1220  01/21/19 1601 01/21/19 1823 01/21/19 2126 01/22/19 0346  HGB 10.9*  --   --   --   --   --   HCT 33.5*  --   --   --   --   --   PLT 321  --   --   --   --   --   APTT  --   --  39*  --   --   --   LABPROT 30.3*  --   --   --   --  28.6*  INR 2.9*  --   --   --   --  2.7*  CREATININE 1.14  --   --   --   --   --   TROPONINIHS 25*   < > 32* 30* 26*  --    < > = values in this interval not displayed.    Estimated Creatinine Clearance: 108.5 mL/min (by C-G formula based on SCr of 1.14 mg/dL).   Medical History: Past Medical History:  Diagnosis Date  . Asthma   . Brain damage   . Chronic pain of both knees 07/13/2018  . COPD (chronic obstructive pulmonary disease) ([place])   . Depression   . HFrEF (heart failure with reduced ejection fraction) ([place])    a. 03/2018 Echo: EF 25-30%, diff HK. Mod dil LA.  [name] Hypertension   . MI (myocardial infarction) ([place])   . Neck pain 07/13/2018  . Persistent atrial fibrillation ([place])    a. 03/2018 s/p DCCV; b. CHA2DS2VASc = 1-->Xarelto; c. 05/2018 recurrent afib-->Amio initiated;   . Sleep apnea     Medications:  (Not in a hospital admission)   Assessment: Pharmacy consulted to dose warfarin in this 58 year old male admitted with cellulitis, PMH of PE/DVT and Afib. Pt was on warfarin 10 mg PO daily at home, last dose was on 12/5 @ 1800.   Date INR Warfarin Dose    12/6 2.9 10 mg  12/7 2.7   Goal of Therapy:  INR 2-3   Plan:  Will continue pt on home dose of warfarin 10 mg PO daily. Will check INR daily since  pt is on abx.   [name] 01/22/2019,7:32 AM

## 2019-01-23 ENCOUNTER — Other Ambulatory Visit: Payer: Self-pay

## 2019-01-23 DIAGNOSIS — S069X9S Unspecified intracranial injury with loss of consciousness of unspecified duration, sequela: Secondary | ICD-10-CM | POA: Diagnosis not present

## 2019-01-23 LAB — BASIC METABOLIC PANEL
Anion gap: 12 (ref 5–15)
BUN: 13 mg/dL (ref 6–20)
CO2: 28 mmol/L (ref 22–32)
Calcium: 8.7 mg/dL — ABNORMAL LOW (ref 8.9–10.3)
Chloride: 92 mmol/L — ABNORMAL LOW (ref 98–111)
Creatinine, Ser: 1.05 mg/dL (ref 0.61–1.24)
GFR calc Af Amer: >60 mL/min (ref >60)
GFR calc non Af Amer: >60 mL/min (ref >60)
Glucose, Bld: 149 mg/dL — ABNORMAL HIGH (ref 70–99)
Potassium: 4.2 mmol/L (ref 3.5–5.1)
Sodium: 132 mmol/L — ABNORMAL LOW (ref 135–145)

## 2019-01-23 LAB — CBC WITH DIFFERENTIAL/PLATELET
Abs Immature Granulocytes: 0.04 10*3/uL (ref 0.00–0.07)
Basophils Absolute: 0.1 10*3/uL (ref 0.0–0.1)
Basophils Relative: 1 %
Eosinophils Absolute: 0.3 10*3/uL (ref 0.0–0.5)
Eosinophils Relative: 5 %
HCT: 37 % — ABNORMAL LOW (ref 39.0–52.0)
Hemoglobin: 12 g/dL — ABNORMAL LOW (ref 13.0–17.0)
Immature Granulocytes: 1 %
Lymphocytes Relative: 14 %
Lymphs Abs: 0.8 10*3/uL (ref 0.7–4.0)
MCH: 30.3 pg (ref 26.0–34.0)
MCHC: 32.4 g/dL (ref 30.0–36.0)
MCV: 93.4 fL (ref 80.0–100.0)
Monocytes Absolute: 0.4 10*3/uL (ref 0.1–1.0)
Monocytes Relative: 7 %
Neutro Abs: 4.3 10*3/uL (ref 1.7–7.7)
Neutrophils Relative %: 72 %
Platelets: 295 10*3/uL (ref 150–400)
RBC: 3.96 MIL/uL — ABNORMAL LOW (ref 4.22–5.81)
RDW: 14.7 % (ref 11.5–15.5)
WBC: 5.8 10*3/uL (ref 4.0–10.5)
nRBC: 0 % (ref 0.0–0.2)

## 2019-01-23 LAB — PROTIME-INR
INR: 3.4 — ABNORMAL HIGH (ref 0.8–1.2)
Prothrombin Time: 33.9 seconds — ABNORMAL HIGH (ref 11.4–15.2)

## 2019-01-23 LAB — GLUCOSE, CAPILLARY: Glucose-Capillary: 115 mg/dL — ABNORMAL HIGH (ref 70–99)

## 2019-01-23 LAB — MAGNESIUM: Magnesium: 1.6 mg/dL — ABNORMAL LOW (ref 1.7–2.4)

## 2019-01-23 MED ORDER — SODIUM CHLORIDE 0.9 % IV SOLN
INTRAVENOUS | Status: DC | PRN
  Administered 2019-01-23 – 2019-01-24 (×2): 250 mL via INTRAVENOUS

## 2019-01-23 MED ORDER — SODIUM CHLORIDE 0.9% FLUSH: 10.0000 mL | INTRAVENOUS | Status: DC | PRN

## 2019-01-23 NOTE — ED Notes (Signed)
Pt sitting up in chair, Amber, RN to given pt oxy, pt refused stating he wanted morphine.

## 2019-01-23 NOTE — Progress Notes (Signed)
Ch visited with pt to complete AD education. Pt is a 58 y.o. male with a hx of COPD and hypertension. Pt shared that his sister-in-law (SIL) would be his HPOA. Ch informed pt of limitations with completing the AD at this time due to witnesses not being allowed to go in pt spaces. Pt understood and shared with ch that he would want to remain full code status but not an organ donor. Ch understood and encouraged pt to have the document completed at his local bank. Pt shared that his only family that is living is his SIL (wife's younger sister) since the recent death of his wife. Ch is not sure of how soon the pt's wife passed. Ch allowed space for the pt to give a life review, express his frustrations with his current limitations and also the health changes that he has been dealing with.    01/23/19 1200  Clinical Encounter Type  Visited With Patient  Visit Type Other (Comment) (AD edu)  Referral From Nurse  Consult/Referral To Chaplain  Spiritual Encounters  Spiritual Needs Grief support  Stress Factors  Patient Stress Factors Exhausted;Health changes;Major life changes;Loss  Family Stress Factors None identified  Advance Directives (For Healthcare)  Does Patient Have a Medical Advance Directive? No  Would patient like information on creating a medical advance directive? Yes (Inpatient - patient defers creating a medical advance directive at this time - Information given)

## 2019-01-23 NOTE — Plan of Care (Signed)
?  Problem: Education: ?Goal: Understanding of cardiac disease, CV risk reduction, and recovery process will improve ?Outcome: Progressing ?  ?Problem: Cardiac: ?Goal: Ability to achieve and maintain adequate cardiovascular perfusion will improve ?Outcome: Progressing ?  ?Problem: Health Behavior/Discharge Planning: ?Goal: Ability to safely manage health-related needs after discharge will improve ?Outcome: Progressing ?  ?

## 2019-01-23 NOTE — Progress Notes (Addendum)
ANTICOAGULATION CONSULT NOTE   Pharmacy Consult for Warfarin  Indication: atrial fibrillation  No Known Allergies  Patient Measurements: Height: 5\' 8"  (172.7 cm) Weight: (!) 372 lb 9.2 oz (169 kg) IBW/kg (Calculated) : 68.4 Heparin Dosing Weight:   Vital Signs: Temp: 98.2 F (36.8 C) (12/07 2345) Temp Source: Oral (12/07 2345) BP: 95/41 (12/08 0556) Pulse Rate: 69 (12/08 0556)  Labs: Recent Labs    01/21/19 1220  01/21/19 1601 01/21/19 1823 01/21/19 2126 01/22/19 0346  HGB 10.9*  --   --   --   --   --   HCT 33.5*  --   --   --   --   --   PLT 321  --   --   --   --   --   APTT  --   --  39*  --   --   --   LABPROT 30.3*  --   --   --   --  28.6*  INR 2.9*  --   --   --   --  2.7*  CREATININE 1.14  --   --   --   --   --   TROPONINIHS 25*   < > 32* 30* 26*  --    < > = values in this interval not displayed.    Estimated Creatinine Clearance: 108.5 mL/min (by C-G formula based on SCr of 1.14 mg/dL).   Medical History: Past Medical History:  Diagnosis Date  . Asthma   . Brain damage   . Chronic pain of both knees 07/13/2018  . COPD (chronic obstructive pulmonary disease) ([place])   . Depression   . HFrEF (heart failure with reduced ejection fraction) ([place])    a. 03/2018 Echo: EF 25-30%, diff HK. Mod dil LA.  [name] Hypertension   . MI (myocardial infarction) ([place])   . Neck pain 07/13/2018  . Persistent atrial fibrillation ([place])    a. 03/2018 s/p DCCV; b. CHA2DS2VASc = 1-->Xarelto; c. 05/2018 recurrent afib-->Amio initiated;   . Sleep apnea     Medications:  (Not in a hospital admission)   Assessment: Pharmacy consulted to dose warfarin in this 58 year old male admitted with cellulitis, PMH of PE/DVT and Afib. Pt was on warfarin 10 mg PO daily at home, last dose was on 12/5 @ 1800. Clindamycin started yesterday, though this is not an antibiotic that typically increases the INR. INR could be elevated d/t infection or other causes.   Today's INR is supratherapeutic.  Confirmed with ED nurse, no active bleeding.  Date INR Warfarin Dose    12/6 2.9 10 mg  12/7 2.7 10 mg  (Ordered yet not given) 12/8 3.4  Goal of Therapy:  INR 2-3   Plan:  Will hold warfarin dose today.  Will check INR daily since pt is on abx.   [name] 01/23/2019,7:23 AM

## 2019-01-23 NOTE — Progress Notes (Signed)
Wound care completed at this time, as ordered. Appreciate help of [place], [place]. Patient tolerated fairly well. Used 2 Aquacell pads to the left leg. Of note, wounds are difficult to measure. Will not include measurements. Wounds aren't documented in flow sheets, either by RN or [place] RN. Will enter info. Will continue to monitor wound dressing status for the remainder of the shift. Will pass along in shift report. Wenda Low [facility]

## 2019-01-23 NOTE — Progress Notes (Signed)
Brief history: [name] [name] is a 58 y.o. male with medical history significant of hypertension, hyperlipidemia, diet-controlled diabetes, COPD, hypothyroidism, depression, OSA on CPAP, atrial fibrillation on Coumadin, CAD, dCHF, PE/DVT on Coumadin, who presents with bilateral leg pain and chest pain and chronic bilateral lower leg pain, leg edema and oozing lesions in both legs. He has been following at the wound clinic, and started on Bactrim on 01/16/2019 without significant improvement. He states when he was seen last week he was told he needed to go to the hospital for IV antibiotics.  Patient has nausea, but no vomiting, diarrhea or abdominal pain no symptoms of UTI or unilateral weakness. ED Course: pt was found to have troponin XX 5, lactic acid 2.4, INR 2.9, WBC 7.2, pending COVID-19 test, electrolytes renal function okay, temperature 100.4, blood pressure 151/74, heart rate 77, oxygen saturation 92% on room air.  Chest x-ray negative.  Patient is admitted to telemetry bed as inpatient.   Subjective: States his pain better than when he first came in but is still not his baseline.  Denies any fever.  Was sitting on the chair.  He says that his wound care doctor told him that he might be staying in the hospital for at least a week with IV antibiotic.  Objective: Vital signs in last 24 hours: Temp:  [98.2 F (36.8 C)-98.8 F (37.1 C)] 98.8 F (37.1 C) (12/08 0951) Pulse Rate:  [69-73] 70 (12/08 0951) Resp:  [16-22] 16 (12/08 0951) BP: (95-134)/(41-84) 118/69 (12/08 0951) SpO2:  [96 %-100 %] 99 % (12/08 0951) Weight:  [186.3 kg] 186.3 kg (12/08 0951)  Intake/Output from previous day: 12/07 0701 - 12/08 0700 In: 500 [P.O.:350] Out: 1900 [Urine:1900] Intake/Output this shift: Total I/O In: -  Out: 400 [Urine:400]  General: Not in acute distress HEENT:       Eyes: PERRL, EOMI, no scleral icterus.       ENT: No discharge from the ears and nose, no pharynx injection, no tonsillar  enlargement.        Neck: No JVD, no bruit, no mass felt. Heme: No neck lymph node enlargement. Cardiac: S1/S2, RRR, No murmurs, No gallops or rubs. Respiratory: No rales, wheezing, rhonchi or rubs. GI: Soft, nondistended, nontender, no rebound pain, no organomegaly, BS present. GU: No hematuria Ext: 2-3+ pitting leg edema bilaterally. 2+DP/PT pulse bilaterally. Musculoskeletal: No joint deformities, No joint redness or warmth, no limitation of ROM in spin. Skin: has wound in bilateral lower legs, left lower extremity wound greater than right with some serous and mucopurulent drainage coming from both wounds. Neuro: Alert, oriented X3, cranial nerves II-XII grossly intact, moves all extremities normally.  Psych: Patient is not psychotic, no suicidal or hemocidal ideation.  Results for orders placed or performed during the hospital encounter of 01/21/19 (from the past 24 hour(s))  Protime-INR     Status: Abnormal   Collection Time: 01/23/19  6:08 AM  Result Value Ref Range   Prothrombin Time 33.9 (H) 11.4 - 15.2 seconds   INR 3.4 (H) 0.8 - 1.2  Glucose, capillary     Status: Abnormal   Collection Time: 01/23/19  7:32 AM  Result Value Ref Range   Glucose-Capillary 115 (H) 70 - 99 mg/dL   Comment 1 Notify RN   Basic metabolic panel     Status: Abnormal   Collection Time: 01/23/19 11:21 AM  Result Value Ref Range   Sodium 132 (L) 135 - 145 mmol/L   Potassium 4.2 3.5 - 5.1 mmol/L  Chloride 92 (L) 98 - 111 mmol/L   CO2 28 22 - 32 mmol/L   Glucose, Bld 149 (H) 70 - 99 mg/dL   BUN 13 6 - 20 mg/dL   Creatinine, Ser 1.05 0.61 - 1.24 mg/dL   Calcium 8.7 (L) 8.9 - 10.3 mg/dL   GFR calc non Af Amer >60 >60 mL/min   GFR calc Af Amer >60 >60 mL/min   Anion gap 12 5 - 15  CBC with Differential/Platelet     Status: Abnormal   Collection Time: 01/23/19 11:21 AM  Result Value Ref Range   WBC 5.8 4.0 - 10.5 K/uL   RBC 3.96 (L) 4.22 - 5.81 MIL/uL   Hemoglobin 12.0 (L) 13.0 - 17.0 g/dL   HCT  37.0 (L) 39.0 - 52.0 %   MCV 93.4 80.0 - 100.0 fL   MCH 30.3 26.0 - 34.0 pg   MCHC 32.4 30.0 - 36.0 g/dL   RDW 14.7 11.5 - 15.5 %   Platelets 295 150 - 400 K/uL   nRBC 0.0 0.0 - 0.2 %   Neutrophils Relative % 72 %   Neutro Abs 4.3 1.7 - 7.7 K/uL   Lymphocytes Relative 14 %   Lymphs Abs 0.8 0.7 - 4.0 K/uL   Monocytes Relative 7 %   Monocytes Absolute 0.4 0.1 - 1.0 K/uL   Eosinophils Relative 5 %   Eosinophils Absolute 0.3 0.0 - 0.5 K/uL   Basophils Relative 1 %   Basophils Absolute 0.1 0.0 - 0.1 K/uL   Immature Granulocytes 1 %   Abs Immature Granulocytes 0.04 0.00 - 0.07 K/uL  Magnesium     Status: Abnormal   Collection Time: 01/23/19 11:21 AM  Result Value Ref Range   Magnesium 1.6 (L) 1.7 - 2.4 mg/dL    Studies/Results: Le Doppler In [facility] (US Venous Img Lower Bilateral (dvt)  Result Date: 01/21/2019 CLINICAL DATA:  Cellulitis, pain and edema EXAM: BILATERAL LOWER EXTREMITY VENOUS DOPPLER ULTRASOUND TECHNIQUE: Gray-scale sonography with graded compression, as well as color Doppler and duplex ultrasound were performed to evaluate the lower extremity deep venous systems from the level of the common femoral vein and including the common femoral, femoral, profunda femoral, popliteal and calf veins including the posterior tibial, peroneal and gastrocnemius veins when visible. The superficial great saphenous vein was also interrogated. Spectral Doppler was utilized to evaluate flow at rest and with distal augmentation maneuvers in the common femoral, femoral and popliteal veins. COMPARISON:  None. FINDINGS: RIGHT LOWER EXTREMITY Common Femoral Vein: No evidence of thrombus. Normal compressibility, respiratory phasicity and response to augmentation. Saphenofemoral Junction: No evidence of thrombus. Normal compressibility and flow on color Doppler imaging. Profunda Femoral Vein: No evidence of thrombus. Normal compressibility and flow on color Doppler imaging. Femoral Vein: No evidence of  thrombus. Normal compressibility, respiratory phasicity and response to augmentation. Popliteal Vein: Limited due to patient body habitus. Given this limitation, no evidence of thrombus. Normal compressibility, respiratory phasicity and response to augmentation. Calf Veins: Limited due to patient body habitus. Given this limitation, no evidence of thrombus. Normal compressibility and flow on color Doppler imaging. Superficial Great Saphenous Vein: No evidence of thrombus. Normal compressibility. Venous Reflux:  None. Other Findings:  None. LEFT LOWER EXTREMITY Common Femoral Vein: No evidence of thrombus. Normal compressibility, respiratory phasicity and response to augmentation. Saphenofemoral Junction: No evidence of thrombus. Normal compressibility and flow on color Doppler imaging. Profunda Femoral Vein: No evidence of thrombus. Normal compressibility and flow on color Doppler imaging. Femoral Vein:  No evidence of thrombus. Normal compressibility, respiratory phasicity and response to augmentation. Popliteal Vein: Limited due to patient body habitus. Given this limitation, no evidence of thrombus. Normal compressibility, respiratory phasicity and response to augmentation. Calf Veins: Limited due to patient body habitus. Given this limitation, no evidence of thrombus. Normal compressibility and flow on color Doppler imaging. Superficial Great Saphenous Vein: No evidence of thrombus. Normal compressibility. Venous Reflux:  None. Other Findings:  None. IMPRESSION: No evidence of deep venous thrombosis in either lower extremity. Electronically Signed   By: Zerita Boers M.D.   On: 01/21/2019 17:40   Dg Chest Portable 1 View  Result Date: 01/21/2019 CLINICAL DATA:  Chest pain. EXAM: PORTABLE CHEST 1 VIEW COMPARISON:  10/23/2018 FINDINGS: Stable cardiac enlargement. No pleural effusion or edema. No airspace opacities identified. Osseous structures are unremarkable. IMPRESSION: No acute cardiopulmonary  abnormalities. Electronically Signed   By: Kerby Moors M.D.   On: 01/21/2019 13:13    Scheduled Meds: . acidophilus  1 capsule Oral BID  . allopurinol  100 mg Oral Daily  . amiodarone  200 mg Oral Daily  . atorvastatin  40 mg Oral Daily  . DULoxetine  20 mg Oral Daily  . ferrous sulfate  325 mg Oral BID WC  . hydrocortisone  25 mg Rectal BID  . levothyroxine  50 mcg Oral QAC breakfast  . lisinopril  2.5 mg Oral Daily  . metoprolol succinate  50 mg Oral BID  . nystatin-triamcinolone ointment  1 application Topical BID  . spironolactone  25 mg Oral Daily  . torsemide  40 mg Oral BID  . traZODone  150 mg Oral QHS  . Warfarin - Pharmacist Dosing Inpatient   Does not apply q1800   Continuous Infusions: . clindamycin (CLEOCIN) IV Stopped (01/23/19 1007)   PRN Meds:acetaminophen, albuterol, hydrALAZINE, morphine injection, nitroGLYCERIN, ondansetron (ZOFRAN) IV, oxyCODONE-acetaminophen  Assessment/Plan: Cellulitis of lower extremity:  - Patient failed outpatient antibiotic treatment -Was started on empiric antimicrobial treatment with vancomycin and Rocephin; changed to clindamycin on 01/22/2019 - Blood cultures x 2 --pending - ESR and CRP mildly elevated - wound care consult; follow recs  -That is post IVF of 500 ml of RL, followed by 75 cc/h; may discontinue IV hydration - LE doppler --no DVT -Wound care consulted and provided some recommendation.  May need to follow by wound care.   Chronic systolic heart failure ([place]): stable no acute issue - 2D echo on 03/24/2018 showed EF 25-30% -Continue home torsemide, spironolactone, metoprolol, lisinopril  COPD (chronic obstructive pulmonary disease) ([place]): stable -Bronchodilators  Obstructive sleep apnea: -CPAP  Persistent atrial fibrillation ([place]): -Continue Coumadin per pharm dosing -Continue metoprolol and amiodarone  HTN (hypertension): Blood pressure stable -on torsemide, spironolactone, metoprolol, lisinopril -Hold  if running low  Gout: -Allopurinol  Personal history of DVT (deep vein thrombosis) and PE: INR 2.9 on coumadin -on coumadin.  Chest pain and elevated trop the time of admission and hx of CAD:  Pain resolved - patient has a chronically elevated troponin, baseline troponin XVIII-31 recently.  He is troponin is a 25 which is at baseline.  Patient has a tenderness in the left side of her chest wall, indicating possible musculoskeletal pain. -percocet prn - prn nitroglycerin -trend trop -A1c 6.3 , FLP: Normal  -To monitor  Depression, major, single episode, moderate ([place]): No acute issue -continue home meds  HLD (hyperlipidemia): -lipitor   DVT ppx: on Coumdin Code Status: Full code   LOS: 1 day   Thornell Mule, MD

## 2019-01-24 DIAGNOSIS — S069X9S Unspecified intracranial injury with loss of consciousness of unspecified duration, sequela: Secondary | ICD-10-CM | POA: Diagnosis not present

## 2019-01-24 DIAGNOSIS — I4819 Other persistent atrial fibrillation: Secondary | ICD-10-CM

## 2019-01-24 DIAGNOSIS — S81809A Unspecified open wound, unspecified lower leg, initial encounter: Secondary | ICD-10-CM

## 2019-01-24 LAB — PROTIME-INR
INR: 3 — ABNORMAL HIGH (ref 0.8–1.2)
Prothrombin Time: 30.7 seconds — ABNORMAL HIGH (ref 11.4–15.2)

## 2019-01-24 LAB — CBC
HCT: 31.3 % — ABNORMAL LOW (ref 39.0–52.0)
Hemoglobin: 10.2 g/dL — ABNORMAL LOW (ref 13.0–17.0)
MCH: 30.2 pg (ref 26.0–34.0)
MCHC: 32.6 g/dL (ref 30.0–36.0)
MCV: 92.6 fL (ref 80.0–100.0)
Platelets: 253 10*3/uL (ref 150–400)
RBC: 3.38 MIL/uL — ABNORMAL LOW (ref 4.22–5.81)
RDW: 14.8 % (ref 11.5–15.5)
WBC: 6.4 10*3/uL (ref 4.0–10.5)
nRBC: 0 % (ref 0.0–0.2)

## 2019-01-24 LAB — T3, FREE: T3, Free: 2.3 pg/mL (ref 2.0–4.4)

## 2019-01-24 MED ORDER — WARFARIN SODIUM 5 MG PO TABS
5.0000 mg | ORAL_TABLET | Freq: Once | ORAL | Status: AC
  Administered 2019-01-24: 5 mg via ORAL
  Filled 2019-01-24: qty 1

## 2019-01-24 MED ORDER — MAGNESIUM SULFATE 2 GM/50ML IV SOLN
2.0000 g | Freq: Once | INTRAVENOUS | Status: AC
  Administered 2019-01-24: 2 g via INTRAVENOUS
  Filled 2019-01-24: qty 50

## 2019-01-24 NOTE — Progress Notes (Signed)
ANTICOAGULATION CONSULT NOTE   Pharmacy Consult for Warfarin  Indication: atrial fibrillation  No Known Allergies  Patient Measurements: Height: 5\' 8"  (172.7 cm) Weight: (!) 411 lb (186.4 kg) IBW/kg (Calculated) : 68.4  Vital Signs: Temp: 98.5 F (36.9 C) (12/09 0748) Temp Source: Oral (12/09 0748) BP: 114/62 (12/09 0748) Pulse Rate: 67 (12/09 0748)  Labs: Recent Labs    01/21/19 1220  01/21/19 1601 01/21/19 1823 01/21/19 2126 01/22/19 0346 01/23/19 0608 01/23/19 1121 01/24/19 0610  HGB 10.9*  --   --   --   --   --   --  12.0* 10.2*  HCT 33.5*  --   --   --   --   --   --  37.0* 31.3*  PLT 321  --   --   --   --   --   --  295 253  APTT  --   --  39*  --   --   --   --   --   --   LABPROT 30.3*  --   --   --   --  28.6* 33.9*  --  30.7*  INR 2.9*  --   --   --   --  2.7* 3.4*  --  3.0*  CREATININE 1.14  --   --   --   --   --   --  1.05  --   TROPONINIHS 25*   < > 32* 30* 26*  --   --   --   --    < > = values in this interval not displayed.    Estimated Creatinine Clearance: 125.4 mL/min (by C-G formula based on SCr of 1.05 mg/dL).   Medical History: Past Medical History:  Diagnosis Date  . Asthma   . Brain damage   . Chronic pain of both knees 07/13/2018  . COPD (chronic obstructive pulmonary disease) ([facility])   . Depression   . HFrEF (heart failure with reduced ejection fraction) ([facility])    a. 03/2018 Echo: EF 25-30%, diff HK. Mod dil LA.  04/2018 Hypertension   . MI (myocardial infarction) ([facility])   . Neck pain 07/13/2018  . Persistent atrial fibrillation ([facility])    a. 03/2018 s/p DCCV; b. CHA2DS2VASc = 1-->Xarelto; c. 05/2018 recurrent afib-->Amio initiated;   . Sleep apnea     Medications:  Medications Prior to Admission  Medication Sig Dispense Refill Last Dose  . allopurinol (ZYLOPRIM) 100 MG tablet TAKE 1 TABLET BY MOUTH ONCE DAILY (Patient taking differently: Take 100 mg by mouth daily. ) 30 tablet 0 01/20/2019 at 0800  . amiodarone (PACERONE) 200 MG tablet  Take 1 tablet (200 mg total) by mouth daily. 30 tablet 0 01/20/2019 at 0800  . atorvastatin (LIPITOR) 40 MG tablet TAKE 1 TABLET BY MOUTH ONCE DAILY (Patient taking differently: Take 40 mg by mouth daily. ) 30 tablet 2 01/20/2019 at 0800  . DULoxetine (CYMBALTA) 20 MG capsule Take 20 mg by mouth daily.   01/20/2019 at 0800  . levothyroxine (SYNTHROID) 50 MCG tablet Take 1 tablet (50 mcg total) by mouth daily before breakfast. 30 tablet 1 01/20/2019 at 0700  . lisinopril (ZESTRIL) 2.5 MG tablet TAKE 1 TABLET BY MOUTH ONCE DAILY (Patient taking differently: Take 2.5 mg by mouth daily. ) 30 tablet 0 01/20/2019 at 0800  . metoprolol succinate (TOPROL-XL) 50 MG 24 hr tablet Take 50 mg by mouth 2 (two) times daily. Take with or immediately following a meal.  01/20/2019 at 1800  . spironolactone (ALDACTONE) 25 MG tablet TAKE 1 TABLET BY MOUTH ONCE DAILY (Patient taking differently: Take 25 mg by mouth daily. ) 30 tablet 3 01/20/2019 at 0800  . sulfamethoxazole-trimethoprim (BACTRIM DS) 800-160 MG tablet Take 1 tablet by mouth 2 (two) times daily. 14 tablet 0 01/20/2019 at 0800  . torsemide (DEMADEX) 20 MG tablet Take 2 tablets (40 mg total) by mouth 2 (two) times daily. 120 tablet 2 01/20/2019 at 0800  . traZODone (DESYREL) 150 MG tablet TAKE 1 TABLET BY MOUTH AT BEDTIME AS NEEDED SLEEP (Patient taking differently: Take 150 mg by mouth at bedtime. ) 30 tablet 2 01/20/2019 at 2100  . warfarin (COUMADIN) 5 MG tablet Take 3 tablets (15 mg total) by mouth daily. (Patient taking differently: Take 10 mg by mouth daily. ) 90 tablet 1 01/20/2019 at 1800  . albuterol (VENTOLIN HFA) 108 (90 Base) MCG/ACT inhaler Inhale 2 puffs into the lungs every 6 (six) hours as needed for wheezing or shortness of breath. 1 Inhaler 3 prn at prn  . ferrous sulfate 325 (65 FE) MG tablet Take 1 tablet (325 mg total) by mouth 2 (two) times daily with a meal. 60 tablet 3   . hydrocortisone (ANUSOL-HC) 25 MG suppository Place 1 suppository (25 mg  total) rectally 2 (two) times daily. 12 suppository 12 prn at prn  . meloxicam (MOBIC) 15 MG tablet Take 1 tablet (15 mg total) by mouth daily. (Patient not taking: Reported on 01/21/2019) 90 tablet 1 Not Taking at Unknown time  . nitroGLYCERIN (NITROSTAT) 0.4 MG SL tablet Place 1 tablet (0.4 mg total) under the tongue every 5 (five) minutes as needed for chest pain. 15 tablet 12 prn at prn  . nystatin-triamcinolone ointment (MYCOLOG) Apply 1 application topically 2 (two) times daily. 30 g 0 prn at prn    Assessment: Pharmacy consulted to dose warfarin in this 58 year old male admitted with cellulitis, PMH of PE/DVT and Afib. Pt was on warfarin 10 mg PO daily at home, last dose was on 12/5 @ 1800. Clindamycin started yesterday, though this is not an antibiotic that typically increases the INR. INR could be elevated d/t infection or other causes. DDI: pt is on amiodarone (home med).   Date INR Warfarin Dose    12/6 2.9 10 mg  12/7 2.7 Not given  12/8 3.4       HELD 12/9     3.0       5 mg  Goal of Therapy:  INR 2-3   Plan:  INR is therapeutic in the upper limit of goal. Will give pt 5 mg tonight. Expect the INR to trend down as warfarin was not given for 2 days. Daily INR ordered. Check CBC every 3 days. Hgb trended down from 12 > 10.2 but seems to be stable.    If discharging patient today: recommend decreasing warfarin dose to 8 mg daily (~20% decrease from home dose). Have INR follow up in the next 3-5 days.   Oswald Hillock, PharmD, BCPS 01/24/2019,7:59 AM

## 2019-01-24 NOTE — Progress Notes (Signed)
PROGRESS NOTE    [name] [name]  ZYS:[id number] DOB: Jun 23, 1960 DOA: 01/21/2019 PCP: [name], DO   Brief Narrative:  [name] [name] a 58 y.o.malewith medical history significant ofhypertension, hyperlipidemia, diet-controlled diabetes, COPD, hypothyroidism, depression, OSA on CPAP, atrial fibrillation on Coumadin, CAD,dCHF, PE/DVT on Coumadin, who presents with bilateral leg pain and chest pain and chronicbilateral lower leg pain,leg edemaand oozing lesions in both legs. Hehas been following at the wound clinic, and started onBactrim on 01/16/2019 without significant improvement.[name] when he was seen last week he was told he needed to go to the hospital for IV antibiotics.  Subjective: Patient continued to experience bilateral lower extremity pain, left little worse than right.  Assessment & Plan:   Principal Problem:   Cellulitis of lower extremity Active Problems:   Chronic systolic heart failure (HCC)   COPD (chronic obstructive pulmonary disease) (HCC)   Obstructive sleep apnea   Persistent atrial fibrillation (HCC)   HTN (hypertension)   Gout   Personal history of DVT (deep vein thrombosis)   Chronic atrial fibrillation (HCC)   Chest pain   Depression, major, single episode, moderate (HCC)   PE (pulmonary thromboembolism) (HCC)   Open leg wound   HLD (hyperlipidemia)   CAD (coronary artery disease)   Cellulitis  Cellulitis of lower extremities. Patient was initially treated with vancomycin and Rocephin and then switched to clindamycin.  Blood culture remain negative.  Urine culture with insignificant growth and wound culture with multiorganisms. Patient remained afebrile with no leukocytosis. -We will continue IV clindamycin today-can be switched to p.o. clindamycin for discharge. -Continue wound care.  Chronic systolic heart failure [facility]): Denies any chest pain or shortness of breath.  Ejection fraction 25 to 30% during echo done in 03/24/2018.  -Continue home dose of torsemide, spironolactone, metoprolol and lisinopril. -Continue daily weight.  COPD (chronic obstructive pulmonary disease) (HCC):No acute exacerbation. -Continue home bronchodilators.  Obstructive sleep apnea. -Continue CPAP at night.  Persistent atrial fibrillation ([place]): -Continue Coumadin per pharm dosing -Continue metoprololandamiodarone  HTN (hypertension):Blood pressure stable -ontorsemide, spironolactone,metoprolol, lisinopril -Hold if running low.  Gout: -Allopurinol  Personal history of DVT (deep vein thrombosis)and PE: INR 2.9 on coumadin -on coumadin.  Chest painand elevated trop the time of admission and hx of CAD: Pain resolved - patient has a chronically elevated troponin, baseline troponin XVIII-31 recently. He is troponin is a 25 which is at baseline.  Depression, major, single episode, moderate ([place]): No acute issue -continue home meds  HLD (hyperlipidemia): -lipitor  Objective: Vitals:   01/23/19 1930 01/24/19 0444 01/24/19 0748 01/24/19 1537  BP: 103/61 (!) 111/50 114/62 104/69  Pulse: 67 (!) 59 67 68  Resp: 16 18 19 19   Temp: 99.3 F (37.4 C) 98 F (36.7 C) 98.5 F (36.9 C) 98.4 F (36.9 C)  TempSrc: Oral Oral Oral Oral  SpO2: 100% 94% 96% 100%  Weight:  (!) 186.4 kg    Height:        Intake/Output Summary (Last 24 hours) at 01/24/2019 1802 Last data filed at 01/24/2019 1537 Gross per 24 hour  Intake 120 ml  Output 3525 ml  Net -3405 ml   Filed Weights   01/21/19 1206 01/23/19 0951 01/24/19 0444  Weight: (!) 169 kg (!) 186.3 kg (!) 186.4 kg    Examination:  General exam: Morbidly obese gentleman, in no acute distress. Respiratory system: Clear to auscultation. Respiratory effort normal. Cardiovascular system: S1 & S2 heard, RRR. No JVD, murmurs, rubs, gallops or clicks. 2+ pedal edema. Gastrointestinal  system: Abdomen is nondistended, soft and nontender. No organomegaly or masses felt. Normal  bowel sounds heard. Central nervous system: Alert and oriented. No focal neurological deficits. Extremities: Symmetric 5 x 5 power.  Bilateral lower extremity erythema and edema with oozing wound. Psychiatry: Judgement and insight appear normal. Mood & affect appropriate.    DVT prophylaxis: Coumadin Code Status: Full Family Communication: No family at bedside Disposition Plan: Pending improvement.  Consultants:   Procedures:  Antimicrobials:  Clindamycin.   Data Reviewed: I have personally reviewed following labs and imaging studies  CBC: Recent Labs  Lab 01/21/19 1220 01/23/19 1121 01/24/19 0610  WBC 7.2 5.8 6.4  NEUTROABS 5.3 4.3  --   HGB 10.9* 12.0* 10.2*  HCT 33.5* 37.0* 31.3*  MCV 92.5 93.4 92.6  PLT 321 295 253   Basic Metabolic Panel: Recent Labs  Lab 01/21/19 1220 01/23/19 1121  NA 135 132*  K 3.9 4.2  CL 99 92*  CO2 24 28  GLUCOSE 115* 149*  BUN 17 13  CREATININE 1.14 1.05  CALCIUM 8.4* 8.7*  MG  --  1.6*   GFR: Estimated Creatinine Clearance: 125.4 mL/min (by C-G formula based on SCr of 1.05 mg/dL). Liver Function Tests: No results for input(s): AST, ALT, ALKPHOS, BILITOT, PROT, ALBUMIN in the last 168 hours. No results for input(s): LIPASE, AMYLASE in the last 168 hours. No results for input(s): AMMONIA in the last 168 hours. Coagulation Profile: Recent Labs  Lab 01/21/19 1220 01/22/19 0346 01/23/19 0608 01/24/19 0610  INR 2.9* 2.7* 3.4* 3.0*   Cardiac Enzymes: No results for input(s): CKTOTAL, CKMB, CKMBINDEX, TROPONINI in the last 168 hours. BNP (last 3 results) No results for input(s): PROBNP in the last 8760 hours. HbA1C: Recent Labs    01/22/19 0346  HGBA1C 6.3*   CBG: Recent Labs  Lab 01/23/19 0732  GLUCAP 115*   Lipid Profile: Recent Labs    01/22/19 0346  CHOL 153  HDL 48  LDLCALC 80  TRIG 125  CHOLHDL 3.2   Thyroid Function Tests: Recent Labs    01/22/19 0346 01/23/19 0608  TSH 13.657*  --   T3FREE   --  2.3   Anemia Panel: No results for input(s): VITAMINB12, FOLATE, FERRITIN, TIBC, IRON, RETICCTPCT in the last 72 hours. Sepsis Labs: Recent Labs  Lab 01/21/19 1220 01/21/19 1421 01/21/19 1601 01/21/19 1740  PROCALCITON  --   --  <0.10  --   LATICACIDVEN 2.4* 2.1* 2.2* 2.4*    Recent Results (from the past 240 hour(s))  Aerobic Culture (superficial specimen)     Status: Abnormal   Collection Time: 01/18/19 10:52 AM   Specimen: Leg  Result Value Ref Range Status   Specimen Description LEG LEFT  Final   Special Requests NONE  Final   Gram Stain   Final    WBC PRESENT, PREDOMINANTLY MONONUCLEAR MODERATE GRAM NEGATIVE RODS MODERATE GRAM POSITIVE COCCI Performed at [facility], [address], [place], [place] [address]    Culture MULTIPLE ORGANISMS PRESENT, NONE PREDOMINANT (A)  Final   Report Status 01/21/2019 FINAL  Final  Urine culture     Status: Abnormal   Collection Time: 01/21/19 12:15 PM   Specimen: Urine, Random  Result Value Ref Range Status   Specimen Description   Final    URINE, RANDOM Performed at [facility], [address], [place], [place] [address]    Special Requests   Final    NONE Performed at [facility], [phone]  [address] [place], [place] [address]    Culture (A)  Final    <10,000 COLONIES/mL INSIGNIFICANT GROWTH Performed at [facility], [address] [place], [place] [address]    Report Status 01/22/2019 FINAL  Final  Culture, blood (routine x 2)     Status: None (Preliminary result)   Collection Time: 01/21/19 12:20 PM   Specimen: BLOOD  Result Value Ref Range Status   Specimen Description BLOOD RIGHT ANTECUBITAL  Final   Special Requests   Final    BOTTLES DRAWN AEROBIC AND ANAEROBIC Blood Culture results may not be optimal due to an excessive volume of blood received in culture bottles   Culture   Final    NO GROWTH 3 DAYS Performed at [facility], [address] [place], [place]  [address]    Report Status PENDING  Incomplete  Culture, blood (routine x 2)     Status: None (Preliminary result)   Collection Time: 01/21/19 12:21 PM   Specimen: BLOOD  Result Value Ref Range Status   Specimen Description BLOOD LEFT ANTECUBITAL  Final   Special Requests   Final    BOTTLES DRAWN AEROBIC AND ANAEROBIC Blood Culture adequate volume   Culture   Final    NO GROWTH 3 DAYS Performed at [facility], [address] [place], [place] [address]    Report Status PENDING  Incomplete  SARS CORONAVIRUS 2 (TAT 6-24 HRS) Nasopharyngeal Nasopharyngeal Swab     Status: None   Collection Time: 01/21/19  1:17 PM   Specimen: Nasopharyngeal Swab  Result Value Ref Range Status   SARS Coronavirus 2 NEGATIVE NEGATIVE Final    Comment: (NOTE) SARS-CoV-2 target nucleic acids are NOT DETECTED. The SARS-CoV-2 RNA is generally detectable in upper and lower respiratory specimens during the acute phase of infection. Negative results do not preclude SARS-CoV-2 infection, do not rule out co-infections with other pathogens, and should not be used as the sole basis for treatment or other patient management decisions. Negative results must be combined with clinical observations, patient history, and epidemiological information. The expected result is Negative. Fact Sheet for Patients: HairSlick.nohttps://www.fda.gov/media/138098/download Fact Sheet for Healthcare Providers: quierodirigir.comhttps://www.fda.gov/media/138095/download This test is not yet approved or cleared by the [place] FDA and  has been authorized for detection and/or diagnosis of SARS-CoV-2 by FDA under an Emergency Use Authorization (EUA). This EUA will remain  in effect (meaning this test can be used) for the duration of the COVID-19 declaration under Section 56 4(b)(1) of the Act, 21 U.S.C. section 360bbb-3(b)(1), unless the authorization is terminated or revoked sooner. Performed at [facility], [address] [place],  [place] [address]      Radiology Studies: No results found.  Scheduled Meds: . acidophilus  1 capsule Oral BID  . allopurinol  100 mg Oral Daily  . amiodarone  200 mg Oral Daily  . atorvastatin  40 mg Oral Daily  . DULoxetine  20 mg Oral Daily  . ferrous sulfate  325 mg Oral BID WC  . hydrocortisone  25 mg Rectal BID  . levothyroxine  50 mcg Oral QAC breakfast  . lisinopril  2.5 mg Oral Daily  . metoprolol succinate  50 mg Oral BID  . nystatin-triamcinolone ointment  1 application Topical BID  . spironolactone  25 mg Oral Daily  . torsemide  40 mg Oral BID  . traZODone  150 mg Oral QHS  . Warfarin - Pharmacist Dosing Inpatient   Does not apply  q1800   Continuous Infusions: . sodium chloride 250 mL (01/23/19 2225)  . clindamycin (CLEOCIN) IV 900 mg (01/24/19 1505)     LOS: 2 days   Time spent: 45 minutes.  I personally viewed his chart.  Arnetha Courser, MD [facility] Pager [phone]  If 7PM-7AM, please contact night-coverage [email or web address] Password [facility] 01/24/2019, 6:02 PM   This record has been created using Dragon voice recognition software. Errors have been sought and corrected,but may not always be located. Such creation errors do not reflect on the standard of care.

## 2019-01-24 NOTE — Plan of Care (Signed)
  Problem: Activity: Goal: Ability to tolerate increased activity will improve Outcome: Progressing   Problem: Cardiac: Goal: Ability to achieve and maintain adequate cardiovascular perfusion will improve Outcome: Progressing   

## 2019-01-25 ENCOUNTER — Ambulatory Visit: Payer: Medicaid Other | Admitting: Physician Assistant

## 2019-01-25 DIAGNOSIS — S069X9S Unspecified intracranial injury with loss of consciousness of unspecified duration, sequela: Secondary | ICD-10-CM | POA: Diagnosis not present

## 2019-01-25 LAB — PROTIME-INR
INR: 2.6 — ABNORMAL HIGH (ref 0.8–1.2)
Prothrombin Time: 28.1 seconds — ABNORMAL HIGH (ref 11.4–15.2)

## 2019-01-25 LAB — BASIC METABOLIC PANEL
Anion gap: 11 (ref 5–15)
BUN: 21 mg/dL — ABNORMAL HIGH (ref 6–20)
CO2: 29 mmol/L (ref 22–32)
Calcium: 8.4 mg/dL — ABNORMAL LOW (ref 8.9–10.3)
Chloride: 94 mmol/L — ABNORMAL LOW (ref 98–111)
Creatinine, Ser: 1.15 mg/dL (ref 0.61–1.24)
GFR calc Af Amer: >60 mL/min (ref >60)
GFR calc non Af Amer: >60 mL/min (ref >60)
Glucose, Bld: 164 mg/dL — ABNORMAL HIGH (ref 70–99)
Potassium: 4 mmol/L (ref 3.5–5.1)
Sodium: 134 mmol/L — ABNORMAL LOW (ref 135–145)

## 2019-01-25 LAB — CBC
HCT: 30.6 % — ABNORMAL LOW (ref 39.0–52.0)
Hemoglobin: 10.1 g/dL — ABNORMAL LOW (ref 13.0–17.0)
MCH: 29.4 pg (ref 26.0–34.0)
MCHC: 33 g/dL (ref 30.0–36.0)
MCV: 89.2 fL (ref 80.0–100.0)
Platelets: 247 10*3/uL (ref 150–400)
RBC: 3.43 MIL/uL — ABNORMAL LOW (ref 4.22–5.81)
RDW: 14.8 % (ref 11.5–15.5)
WBC: 6.3 10*3/uL (ref 4.0–10.5)
nRBC: 0 % (ref 0.0–0.2)

## 2019-01-25 LAB — MAGNESIUM: Magnesium: 1.8 mg/dL (ref 1.7–2.4)

## 2019-01-25 MED ORDER — WARFARIN SODIUM 5 MG PO TABS
5.0000 mg | ORAL_TABLET | Freq: Once | ORAL | Status: AC
  Administered 2019-01-25: 17:00:00 5 mg via ORAL
  Filled 2019-01-25: qty 1

## 2019-01-25 MED ORDER — SPIRONOLACTONE 25 MG PO TABS
50.0000 mg | ORAL_TABLET | Freq: Every day | ORAL | Status: DC
  Administered 2019-01-26 – 2019-01-27 (×2): 50 mg via ORAL
  Filled 2019-01-25 (×2): qty 2

## 2019-01-25 MED ORDER — FUROSEMIDE 10 MG/ML IJ SOLN
80.0000 mg | Freq: Two times a day (BID) | INTRAMUSCULAR | Status: DC
  Administered 2019-01-25 – 2019-01-27 (×4): 80 mg via INTRAVENOUS
  Filled 2019-01-25 (×4): qty 8

## 2019-01-25 NOTE — Progress Notes (Signed)
  Tele screen at nurses' station showed patient's HR in the 37s - this RN and multiple staff entered patient's room to assess. Patient was sleeping with CPAP on, chest monitor showed HR at 68. Patient was easily aroused, denied CP/SOB, VSS with BP: 99/52, HR 65, MAP 66, and 94% on RA while lying in bed. Chest leads were adjusted and exchanged.

## 2019-01-25 NOTE — Progress Notes (Signed)
PROGRESS NOTE    [name] [name]  TJQ:[id number] DOB: 12/04/1960 DOA: 01/21/2019 PCP: Dorcas Carrow, DO   Brief Narrative:  [name] Hubbardis a 58 y.o.malewith medical history significant ofhypertension, hyperlipidemia, diet-controlled diabetes, COPD, hypothyroidism, depression, OSA on CPAP, atrial fibrillation on Coumadin, CAD,dCHF, PE/DVT on Coumadin, who presents with bilateral leg pain and chest pain and chronicbilateral lower leg pain,leg edemaand oozing lesions in both legs. Hehas been following at the wound clinic, and started onBactrim on 01/16/2019 without significant improvement.[name] when he was seen last week he was told he needed to go to the hospital for IV antibiotics.  Subjective: Patient continued to experience bilateral lower extremity pain, and worsening edema.  Assessment & Plan:   Principal Problem:   Cellulitis of lower extremity Active Problems:   Chronic systolic heart failure (HCC)   COPD (chronic obstructive pulmonary disease) (HCC)   Obstructive sleep apnea   Persistent atrial fibrillation (HCC)   HTN (hypertension)   Gout   Personal history of DVT (deep vein thrombosis)   Chronic atrial fibrillation (HCC)   Chest pain   Depression, major, single episode, moderate (HCC)   PE (pulmonary thromboembolism) (HCC)   Open leg wound   HLD (hyperlipidemia)   CAD (coronary artery disease)   Cellulitis  Cellulitis of lower extremities. Patient was initially treated with vancomycin and Rocephin and then switched to clindamycin.  Blood culture remain negative.  Urine culture with insignificant growth and wound culture with multiorganisms. Patient remained afebrile with no leukocytosis. -We will continue IV clindamycin today-can be switched to p.o. clindamycin for discharge. -Continue wound care.  Chronic systolic heart failure [facility]): Denies any chest pain or shortness of breath.  Ejection fraction 25 to 30% during echo done in 03/24/2018. Patient  has a weight gain of 25 to 30 pounds over the past month.  Per patient dry weight was around 375 pound.  Her lower extremity edema and erythema can be related to chronic venous congestion and volume overload. -Continue home torsemide. -Start him on IV Lasix 80 mg twice daily. -Increase spironolactone to 50 mg daily. -Continue metoprolol and lisinopril. -Continue daily weight.  COPD (chronic obstructive pulmonary disease) (HCC):No acute exacerbation. -Continue home bronchodilators.  Obstructive sleep apnea. -Continue CPAP at night.  Persistent atrial fibrillation (HCC): -Continue Coumadin per pharm dosing -Continue metoprololandamiodarone  HTN (hypertension):Blood pressure stable -ontorsemide, spironolactone,metoprolol, lisinopril -Hold if running low.  Gout: -Allopurinol  Personal history of DVT (deep vein thrombosis)and PE: INR 2.9 on coumadin -on coumadin.  Chest painand elevated trop the time of admission and hx of CAD: Pain resolved - patient has a chronically elevated troponin, baseline troponin XVIII-31 recently. He is troponin is a 25 which is at baseline.  Depression, major, single episode, moderate (HCC): No acute issue -continue home meds  HLD (hyperlipidemia): -lipitor  Objective: Vitals:   01/24/19 1929 01/25/19 0346 01/25/19 0848 01/25/19 1608  BP: (!) 107/37 116/77 120/67 120/63  Pulse: 65 69 72 71  Resp: 20 20 19 18   Temp: 99.6 F (37.6 C) 98.3 F (36.8 C) 98.2 F (36.8 C)   TempSrc: Oral Oral Oral   SpO2: 94% 99% 97% 100%  Weight:  (!) 187.3 kg    Height:        Intake/Output Summary (Last 24 hours) at 01/25/2019 1913 Last data filed at 01/25/2019 1905 Gross per 24 hour  Intake 500 ml  Output 3950 ml  Net -3450 ml   Filed Weights   01/23/19 0951 01/24/19 0444 01/25/19 0346  Weight: (!) 186.3  kg (!) 186.4 kg (!) 187.3 kg    Examination:  General exam: Morbidly obese gentleman, in no acute distress. Respiratory system:  Clear to auscultation. Respiratory effort normal. Cardiovascular system: S1 & S2 heard, RRR. No JVD, murmurs, rubs, gallops or clicks. 2+ pedal edema. Gastrointestinal system: Abdomen is nondistended, soft and nontender. No organomegaly or masses felt. Normal bowel sounds heard. Central nervous system: Alert and oriented. No focal neurological deficits. Extremities: Symmetric 5 x 5 power.  Bilateral lower extremity erythema and edema with oozing wound. Psychiatry: Judgement and insight appear normal. Mood & affect appropriate.    DVT prophylaxis: Coumadin Code Status: Full Family Communication: No family at bedside Disposition Plan: Pending improvement.  Consultants:   Procedures:  Antimicrobials:  Clindamycin.   Data Reviewed: I have personally reviewed following labs and imaging studies  CBC: Recent Labs  Lab 01/21/19 1220 01/23/19 1121 01/24/19 0610 01/25/19 0417  WBC 7.2 5.8 6.4 6.3  NEUTROABS 5.3 4.3  --   --   HGB 10.9* 12.0* 10.2* 10.1*  HCT 33.5* 37.0* 31.3* 30.6*  MCV 92.5 93.4 92.6 89.2  PLT 321 295 253 247   Basic Metabolic Panel: Recent Labs  Lab 01/21/19 1220 01/23/19 1121 01/25/19 0417  NA 135 132* 134*  K 3.9 4.2 4.0  CL 99 92* 94*  CO2 24 28 29   GLUCOSE 115* 149* 164*  BUN 17 13 21*  CREATININE 1.14 1.05 1.15  CALCIUM 8.4* 8.7* 8.4*  MG  --  1.6* 1.8   GFR: Estimated Creatinine Clearance: 114.9 mL/min (by C-G formula based on SCr of 1.15 mg/dL). Liver Function Tests: No results for input(s): AST, ALT, ALKPHOS, BILITOT, PROT, ALBUMIN in the last 168 hours. No results for input(s): LIPASE, AMYLASE in the last 168 hours. No results for input(s): AMMONIA in the last 168 hours. Coagulation Profile: Recent Labs  Lab 01/21/19 1220 01/22/19 0346 01/23/19 0608 01/24/19 0610 01/25/19 0417  INR 2.9* 2.7* 3.4* 3.0* 2.6*   Cardiac Enzymes: No results for input(s): CKTOTAL, CKMB, CKMBINDEX, TROPONINI in the last 168 hours. BNP (last 3  results) No results for input(s): PROBNP in the last 8760 hours. HbA1C: No results for input(s): HGBA1C in the last 72 hours. CBG: Recent Labs  Lab 01/23/19 0732  GLUCAP 115*   Lipid Profile: No results for input(s): CHOL, HDL, LDLCALC, TRIG, CHOLHDL, LDLDIRECT in the last 72 hours. Thyroid Function Tests: Recent Labs    01/23/19 0608  T3FREE 2.3   Anemia Panel: No results for input(s): VITAMINB12, FOLATE, FERRITIN, TIBC, IRON, RETICCTPCT in the last 72 hours. Sepsis Labs: Recent Labs  Lab 01/21/19 1220 01/21/19 1421 01/21/19 1601 01/21/19 1740  PROCALCITON  --   --  <0.10  --   LATICACIDVEN 2.4* 2.1* 2.2* 2.4*    Recent Results (from the past 240 hour(s))  Aerobic Culture (superficial specimen)     Status: Abnormal   Collection Time: 01/18/19 10:52 AM   Specimen: Leg  Result Value Ref Range Status   Specimen Description LEG LEFT  Final   Special Requests NONE  Final   Gram Stain   Final    WBC PRESENT, PREDOMINANTLY MONONUCLEAR MODERATE GRAM NEGATIVE RODS MODERATE GRAM POSITIVE COCCI Performed at [facility], [address], [place], [place] [address]    Culture MULTIPLE ORGANISMS PRESENT, NONE PREDOMINANT (A)  Final   Report Status 01/21/2019 FINAL  Final  Urine culture     Status: Abnormal   Collection Time: 01/21/19 12:15 PM   Specimen: Urine,  Random  Result Value Ref Range Status   Specimen Description   Final    URINE, RANDOM Performed at [facility], [address] [place], [place] [address]    Special Requests   Final    NONE Performed at [facility], [address] [place], [place] [address]    Culture (A)  Final    <10,000 COLONIES/mL INSIGNIFICANT GROWTH Performed at [facility], [address] [place], [place] [address]    Report Status 01/22/2019 FINAL  Final  Culture, blood (routine x 2)     Status: None (Preliminary result)   Collection Time: 01/21/19 12:20 PM   Specimen: BLOOD  Result Value Ref  Range Status   Specimen Description BLOOD RIGHT ANTECUBITAL  Final   Special Requests   Final    BOTTLES DRAWN AEROBIC AND ANAEROBIC Blood Culture results may not be optimal due to an excessive volume of blood received in culture bottles   Culture   Final    NO GROWTH 4 DAYS Performed at [facility], [address] [place], [place] [address]    Report Status PENDING  Incomplete  Culture, blood (routine x 2)     Status: None (Preliminary result)   Collection Time: 01/21/19 12:21 PM   Specimen: BLOOD  Result Value Ref Range Status   Specimen Description BLOOD LEFT ANTECUBITAL  Final   Special Requests   Final    BOTTLES DRAWN AEROBIC AND ANAEROBIC Blood Culture adequate volume   Culture   Final    NO GROWTH 4 DAYS Performed at [facility], [address] [place], [place] [address]    Report Status PENDING  Incomplete  SARS CORONAVIRUS 2 (TAT 6-24 HRS) Nasopharyngeal Nasopharyngeal Swab     Status: None   Collection Time: 01/21/19  1:17 PM   Specimen: Nasopharyngeal Swab  Result Value Ref Range Status   SARS Coronavirus 2 NEGATIVE NEGATIVE Final    Comment: (NOTE) SARS-CoV-2 target nucleic acids are NOT DETECTED. The SARS-CoV-2 RNA is generally detectable in upper and lower respiratory specimens during the acute phase of infection. Negative results do not preclude SARS-CoV-2 infection, do not rule out co-infections with other pathogens, and should not be used as the sole basis for treatment or other patient management decisions. Negative results must be combined with clinical observations, patient history, and epidemiological information. The expected result is Negative. Fact Sheet for Patients: HairSlick.nohttps://www.fda.gov/media/138098/download Fact Sheet for Healthcare Providers: quierodirigir.comhttps://www.fda.gov/media/138095/download This test is not yet approved or cleared by the [place] FDA and  has been authorized for detection and/or diagnosis of SARS-CoV-2 by FDA  under an Emergency Use Authorization (EUA). This EUA will remain  in effect (meaning this test can be used) for the duration of the COVID-19 declaration under Section 56 4(b)(1) of the Act, 21 U.S.C. section 360bbb-3(b)(1), unless the authorization is terminated or revoked sooner. Performed at [facility], [address] [place], [place] [address]      Radiology Studies: No results found.  Scheduled Meds: . acidophilus  1 capsule Oral BID  . allopurinol  100 mg Oral Daily  . amiodarone  200 mg Oral Daily  . atorvastatin  40 mg Oral Daily  . DULoxetine  20 mg Oral Daily  . ferrous sulfate  325 mg Oral BID WC  . furosemide  80 mg Intravenous BID  . hydrocortisone  25 mg Rectal BID  . levothyroxine  50 mcg Oral QAC breakfast  . lisinopril  2.5  mg Oral Daily  . metoprolol succinate  50 mg Oral BID  . nystatin-triamcinolone ointment  1 application Topical BID  . [START ON 01/26/2019] spironolactone  50 mg Oral Daily  . traZODone  150 mg Oral QHS  . Warfarin - Pharmacist Dosing Inpatient   Does not apply q1800   Continuous Infusions: . sodium chloride 10 mL/hr at 01/24/19 2205  . clindamycin (CLEOCIN) IV 900 mg (01/25/19 1508)     LOS: 3 days   Time spent: 40 minutes.  I personally viewed his chart.  Lorella Nimrod, MD [facility] Pager [phone]  If 7PM-7AM, please contact night-coverage [email or web address] Password [facility] 01/25/2019, 7:13 PM   This record has been created using Dragon voice recognition software. Errors have been sought and corrected,but may not always be located. Such creation errors do not reflect on the standard of care.

## 2019-01-25 NOTE — Progress Notes (Signed)
[place] for Warfarin  Indication: atrial fibrillation  No Known Allergies  Patient Measurements: Height: 5\' 8"  (172.7 cm) Weight: (!) 412 lb 14.4 oz (187.3 kg) IBW/kg (Calculated) : 68.4  Vital Signs: Temp: 98.3 F (36.8 C) (12/10 0346) Temp Source: Oral (12/10 0346) BP: 116/77 (12/10 0346) Pulse Rate: 69 (12/10 0346)  Labs: Recent Labs    01/23/19 0608 01/23/19 1121 01/24/19 0610 01/25/19 0417  HGB  --  12.0* 10.2* 10.1*  HCT  --  37.0* 31.3* 30.6*  PLT  --  295 253 247  LABPROT 33.9*  --  30.7* 28.1*  INR 3.4*  --  3.0* 2.6*  CREATININE  --  1.05  --  1.15    Estimated Creatinine Clearance: 114.9 mL/min (by C-G formula based on SCr of 1.15 mg/dL).   Medical History: Past Medical History:  Diagnosis Date  . Asthma   . Brain damage   . Chronic pain of both knees 07/13/2018  . COPD (chronic obstructive pulmonary disease) ([place])   . Depression   . HFrEF (heart failure with reduced ejection fraction) ([place])    a. 03/2018 Echo: EF 25-30%, diff HK. Mod dil LA.  [name] Hypertension   . MI (myocardial infarction) ([place])   . Neck pain 07/13/2018  . Persistent atrial fibrillation ([place])    a. 03/2018 s/p DCCV; b. CHA2DS2VASc = 1-->Xarelto; c. 05/2018 recurrent afib-->Amio initiated;   . Sleep apnea     Medications:  Medications Prior to Admission  Medication Sig Dispense Refill Last Dose  . allopurinol (ZYLOPRIM) 100 MG tablet TAKE 1 TABLET BY MOUTH ONCE DAILY (Patient taking differently: Take 100 mg by mouth daily. ) 30 tablet 0 01/20/2019 at 0800  . amiodarone (PACERONE) 200 MG tablet Take 1 tablet (200 mg total) by mouth daily. 30 tablet 0 01/20/2019 at 0800  . atorvastatin (LIPITOR) 40 MG tablet TAKE 1 TABLET BY MOUTH ONCE DAILY (Patient taking differently: Take 40 mg by mouth daily. ) 30 tablet 2 01/20/2019 at 0800  . DULoxetine (CYMBALTA) 20 MG capsule Take 20 mg by mouth daily.   01/20/2019 at 0800  . levothyroxine (SYNTHROID) 50 MCG  tablet Take 1 tablet (50 mcg total) by mouth daily before breakfast. 30 tablet 1 01/20/2019 at 0700  . lisinopril (ZESTRIL) 2.5 MG tablet TAKE 1 TABLET BY MOUTH ONCE DAILY (Patient taking differently: Take 2.5 mg by mouth daily. ) 30 tablet 0 01/20/2019 at 0800  . metoprolol succinate (TOPROL-XL) 50 MG 24 hr tablet Take 50 mg by mouth 2 (two) times daily. Take with or immediately following a meal.   01/20/2019 at 1800  . spironolactone (ALDACTONE) 25 MG tablet TAKE 1 TABLET BY MOUTH ONCE DAILY (Patient taking differently: Take 25 mg by mouth daily. ) 30 tablet 3 01/20/2019 at 0800  . sulfamethoxazole-trimethoprim (BACTRIM DS) 800-160 MG tablet Take 1 tablet by mouth 2 (two) times daily. 14 tablet 0 01/20/2019 at 0800  . torsemide (DEMADEX) 20 MG tablet Take 2 tablets (40 mg total) by mouth 2 (two) times daily. 120 tablet 2 01/20/2019 at 0800  . traZODone (DESYREL) 150 MG tablet TAKE 1 TABLET BY MOUTH AT BEDTIME AS NEEDED SLEEP (Patient taking differently: Take 150 mg by mouth at bedtime. ) 30 tablet 2 01/20/2019 at 2100  . warfarin (COUMADIN) 5 MG tablet Take 3 tablets (15 mg total) by mouth daily. (Patient taking differently: Take 10 mg by mouth daily. ) 90 tablet 1 01/20/2019 at 1800  . albuterol (VENTOLIN  HFA) 108 (90 Base) MCG/ACT inhaler Inhale 2 puffs into the lungs every 6 (six) hours as needed for wheezing or shortness of breath. 1 Inhaler 3 prn at prn  . ferrous sulfate 325 (65 FE) MG tablet Take 1 tablet (325 mg total) by mouth 2 (two) times daily with a meal. 60 tablet 3   . hydrocortisone (ANUSOL-HC) 25 MG suppository Place 1 suppository (25 mg total) rectally 2 (two) times daily. 12 suppository 12 prn at prn  . meloxicam (MOBIC) 15 MG tablet Take 1 tablet (15 mg total) by mouth daily. (Patient not taking: Reported on 01/21/2019) 90 tablet 1 Not Taking at Unknown time  . nitroGLYCERIN (NITROSTAT) 0.4 MG SL tablet Place 1 tablet (0.4 mg total) under the tongue every 5 (five) minutes as needed for chest  pain. 15 tablet 12 prn at prn  . nystatin-triamcinolone ointment (MYCOLOG) Apply 1 application topically 2 (two) times daily. 30 g 0 prn at prn    Assessment: Pharmacy consulted to dose warfarin in this 58 year old male admitted with cellulitis, PMH of PE/DVT and Afib. Pt was on warfarin 10 mg PO daily at home, last dose was on 12/5 @ 1800. Clindamycin started yesterday, though this is not an antibiotic that typically increases the INR. INR could be elevated d/t infection or other causes. DDI: pt is on amiodarone (home med).   Date INR Warfarin Dose    12/6 2.9 10 mg  12/7 2.7 Not given  12/8 3.4       HELD 12/9     3.0       5 mg 12/10   2.6       5 mg  Goal of Therapy:  INR 2-3   Plan:  INR is therapeutic. Will give pt 5 mg tonight again, follow the INR trend tomorrow before increasing to 8 mg. Daily INR ordered. Check CBC every 3 days. Hgb trended down from 12 > 10.2 but seems to be stable.    If discharging patient today: recommend decreasing warfarin dose to 8 mg daily (~20% decrease from home dose). Have INR follow up in the next 3-5 days.   Ronnald Ramp, PharmD, BCPS 01/25/2019,8:31 AM

## 2019-01-25 NOTE — TOC Initial Note (Signed)
Transition of Care [facility]) - Initial/Assessment Note    Patient Details  Name: [name] [name] MRN: [id number] Date of Birth: Aug 08, 1960  [facility] CM/SW Contact:    Margarito Liner, LCSW Phone Number: 01/25/2019, 9:33 AM  Clinical Narrative: Readmission prevention screen complete. CSW called patient in room, introduced role, and explained that discharge planning would be discussed. Patient lives home alone. He is active with [facility] for PT, RN, aide, and SW. Patient has a rollator and power wheelchair at home. He says he got the rollator used and it is very old and broken. He also said he needs a wider one due to his weight. Medicaid has told him they cannot pay for him to get a new one. [employer] representative will look into this. Patient's PCP is Olevia Perches, DO but he says he has an appt with a new provider 12/15. His pharmacy is [facility]. No issues obtaining medications. Patient still drives. No further concerns. CSW encouraged patient to contact CSW as needed. CSW will continue to follow patient for support and facilitate return home when stable.                 Expected Discharge Plan: Home w Home Health Services Barriers to Discharge: Continued Medical Work up   Patient Goals and CMS Choice Patient states their goals for this hospitalization and ongoing recovery are:: "I need a new rollator. Mine is old and broken but Medicaid won't pay for me to get a new one."   Choice offered to / list presented to : NA  Expected Discharge Plan and Services Expected Discharge Plan: Home w Home Health Services     Post Acute Care Choice: Resumption of Svcs/PTA Provider Living arrangements for the past 2 months: Apartment                           HH Arranged: RN, PT, Nurse's Aide, Social Work [employer] Agency: [facility] [employer] Date HH Agency Contacted: 01/23/19   Representative spoke with at [facility] Agency: Mellissa Kohut  Prior  Living Arrangements/Services Living arrangements for the past 2 months: Apartment Lives with:: Self Patient language and need for interpreter reviewed:: Yes Do you feel safe going back to the place where you live?: Yes      Need for Family Participation in Patient Care: Yes (Comment) Care giver support system in place?: Yes (comment) Current home services: DME, Home PT, Home RN, Homehealth aide, Other (comment)(Home health social worker) Criminal Activity/Legal Involvement Pertinent to Current Situation/Hospitalization: No - Comment as needed  Activities of Daily Living Home Assistive Devices/Equipment: CPAP, Walker (specify type) ADL Screening (condition at time of admission) Patient's cognitive ability adequate to safely complete daily activities?: Yes Is the patient deaf or have difficulty hearing?: No Does the patient have difficulty seeing, even when wearing glasses/contacts?: No Does the patient have difficulty concentrating, remembering, or making decisions?: No Patient able to express need for assistance with ADLs?: Yes Does the patient have difficulty dressing or bathing?: No Independently performs ADLs?: No Communication: Independent Dressing (OT): Independent Grooming: Independent Feeding: Independent Bathing: Needs assistance Is this a change from baseline?: Pre-admission baseline Toileting: Needs assistance Is this a change from baseline?: Pre-admission baseline In/Out Bed: Independent Walks in Home: Independent with device (comment) Does the patient have difficulty walking or climbing stairs?: Yes Weakness of Legs: Both Weakness of Arms/Hands: None  Permission Sought/Granted Permission sought to share information  with : Chartered certified accountant granted to share information with : Yes, Verbal Permission Granted     Permission granted to share info w AGENCY: [facility]        Emotional Assessment Appearance:: Appears stated  age Attitude/Demeanor/Rapport: Engaged, Gracious Affect (typically observed): Accepting, Appropriate, Calm, Pleasant Orientation: : Oriented to Self, Oriented to Place, Oriented to  Time, Oriented to Situation Alcohol / Substance Use: Not Applicable Psych Involvement: No (comment)  Admission diagnosis:  Cellulitis of lower extremity [L03.119] Cellulitis of lower extremity, unspecified laterality [L03.119] Chest pain, unspecified type [R07.9] Sepsis without acute organ dysfunction, due to unspecified organism [facility]) [A41.9] Patient Active Problem List   Diagnosis Date Noted  . Cellulitis 01/22/2019  . PE (pulmonary thromboembolism) ([place]) 01/21/2019  . Open leg wound 01/21/2019  . HLD (hyperlipidemia) 01/21/2019  . CAD (coronary artery disease) 01/21/2019  . Cellulitis of lower extremity 01/21/2019  . Noncompliance 01/09/2019  . Acquired thrombophilia ([place]) 11/28/2018  . Subtherapeutic international normalized ratio (INR) 11/11/2018  . Acute pulmonary embolism ([place]) 11/01/2018  . Depression, major, single episode, moderate ([place]) 09/17/2018  . Chest pain 08/06/2018  . Acute on chronic systolic CHF (congestive heart failure) ([place]) 07/18/2018  . Chronic atrial fibrillation ([place])   . Personal history of DVT (deep vein thrombosis) 07/13/2018  . History of pulmonary embolism (on Xarelto) 07/13/2018  . Chronic bilateral low back pain with bilateral sciatica 07/13/2018  . TBI (traumatic brain injury) ([place]) 07/04/2018  . Anemia 06/27/2018  . Abnormal thyroid blood test 06/27/2018  . Diet-controlled diabetes mellitus ([place]) 06/06/2018  . HTN (hypertension) 06/06/2018  . Gout 06/06/2018  . Morbid obesity ([place]) 06/06/2018  . Chronic pain 06/06/2018  . Ulcers of both lower extremities, limited to breakdown of skin ([place]) 06/06/2018  . Persistent atrial fibrillation ([place])   . Chronic systolic heart failure ([place]) 02/02/2018  . COPD (chronic obstructive pulmonary disease) ([place]) 02/02/2018  .  Obstructive sleep apnea 02/02/2018  . Acute respiratory failure with hypoxia (H[facility]) 01/27/2018   PCP:  [name], DO Pharmacy:   [facility], [place] [address] Phone: [phone] Fax: [phone]     Social Determinants of Health (SDOH) Interventions    Readmission Risk Interventions Readmission Risk Prevention Plan 01/25/2019 10/25/2018 08/08/2018  Transportation Screening Complete Complete Complete  PCP or Specialist Appt within 3-5 Days - Complete Complete  HRI or [place] - Complete Complete  Social Work Consult for [place] Planning/Counseling - Complete Patient refused  Palliative Care Screening - Not Applicable Not Applicable  Medication Review Press photographer) Complete Complete Complete  PCP or Specialist appointment within 3-5 days of discharge Complete - -  [place] or Home Care Consult Complete - -  SW Recovery Care/Counseling Consult Complete - -  Palliative Care Screening Not Applicable - -  [place] Not Applicable - -

## 2019-01-25 NOTE — Plan of Care (Signed)
  Problem: Activity: Goal: Ability to tolerate increased activity will improve Outcome: Progressing   Problem: Cardiac: Goal: Ability to achieve and maintain adequate cardiovascular perfusion will improve Outcome: Progressing   

## 2019-01-25 NOTE — Progress Notes (Signed)
Wound care done for this shift. Patient had one container of econopaste wrap which he insisted on applying it after placing the aquacel to the left leg wound. Informed him that the wound nurse did not recommend using it. However, he insisted on using it on the left leg only since he only had one wrap. He reports this is what they use at the wound center. The wrap is not touching the open wound (aquacel and abd pad in place first). Instead of the ACE wrap, he requested I use coban bilaterally as this stays in place better and is what is used at wound clinic. Patient was educated.

## 2019-01-26 ENCOUNTER — Encounter: Payer: Medicaid Other | Admitting: Physician Assistant

## 2019-01-26 ENCOUNTER — Encounter: Payer: Self-pay | Admitting: Family Medicine

## 2019-01-26 ENCOUNTER — Telehealth: Payer: Self-pay

## 2019-01-26 ENCOUNTER — Ambulatory Visit: Payer: Self-pay | Admitting: *Deleted

## 2019-01-26 DIAGNOSIS — L03119 Cellulitis of unspecified part of limb: Secondary | ICD-10-CM

## 2019-01-26 DIAGNOSIS — R600 Localized edema: Secondary | ICD-10-CM

## 2019-01-26 DIAGNOSIS — I5022 Chronic systolic (congestive) heart failure: Secondary | ICD-10-CM

## 2019-01-26 DIAGNOSIS — I890 Lymphedema, not elsewhere classified: Secondary | ICD-10-CM

## 2019-01-26 DIAGNOSIS — S069X9S Unspecified intracranial injury with loss of consciousness of unspecified duration, sequela: Secondary | ICD-10-CM | POA: Diagnosis not present

## 2019-01-26 LAB — BASIC METABOLIC PANEL
Anion gap: 5 (ref 5–15)
BUN: 20 mg/dL (ref 6–20)
CO2: 34 mmol/L — ABNORMAL HIGH (ref 22–32)
Calcium: 8.5 mg/dL — ABNORMAL LOW (ref 8.9–10.3)
Chloride: 96 mmol/L — ABNORMAL LOW (ref 98–111)
Creatinine, Ser: 1.1 mg/dL (ref 0.61–1.24)
GFR calc Af Amer: >60 mL/min (ref >60)
GFR calc non Af Amer: >60 mL/min (ref >60)
Glucose, Bld: 125 mg/dL — ABNORMAL HIGH (ref 70–99)
Potassium: 4 mmol/L (ref 3.5–5.1)
Sodium: 135 mmol/L (ref 135–145)

## 2019-01-26 LAB — CULTURE, BLOOD (ROUTINE X 2)
Culture: NO GROWTH
Culture: NO GROWTH
Special Requests: ADEQUATE

## 2019-01-26 LAB — PROTIME-INR
INR: 2.6 — ABNORMAL HIGH (ref 0.8–1.2)
Prothrombin Time: 27.4 seconds — ABNORMAL HIGH (ref 11.4–15.2)

## 2019-01-26 MED ORDER — CLINDAMYCIN HCL 150 MG PO CAPS
300.0000 mg | ORAL_CAPSULE | Freq: Four times a day (QID) | ORAL | Status: DC
  Administered 2019-01-26 – 2019-01-27 (×4): 300 mg via ORAL
  Filled 2019-01-26 (×6): qty 2

## 2019-01-26 MED ORDER — WARFARIN SODIUM 5 MG PO TABS
5.0000 mg | ORAL_TABLET | Freq: Once | ORAL | Status: AC
  Administered 2019-01-26: 5 mg via ORAL
  Filled 2019-01-26: qty 1

## 2019-01-26 NOTE — Plan of Care (Signed)
  Problem: Cardiac: Goal: Ability to achieve and maintain adequate cardiovascular perfusion will improve Outcome: Progressing   

## 2019-01-26 NOTE — Chronic Care Management (AMB) (Signed)
Chronic Care Management   Follow Up Note   01/26/2019 Name: [name] [name] MRN: [id number] DOB: 1961/01/15  Referred by: [name], DO Reason for referral : Chronic Care Management (Cellulitis of bilateral lower legs)   [name] [name] is a 58 y.o. year old male who is a primary care patient of [name], DO. The CCM team was consulted for assistance with chronic disease management and care coordination needs.    Review of patient status, including review of consultants reports, relevant laboratory and other test results, and collaboration with appropriate care team members and the patient's provider was performed as part of comprehensive patient evaluation and provision of chronic care management services.    SDOH (Social Determinants of Health) screening performed today: Transportation. See Care Plan for related entries.   Facility-Administered Encounter Medications as of 01/26/2019  Medication  . 0.9 %  sodium chloride infusion  . acetaminophen (TYLENOL) tablet 650 mg  . acidophilus (RISAQUAD) capsule 1 capsule  . albuterol (PROVENTIL) (2.5 MG/3ML) 0.083% nebulizer solution 3 mL  . allopurinol (ZYLOPRIM) tablet 100 mg  . amiodarone (PACERONE) tablet 200 mg  . atorvastatin (LIPITOR) tablet 40 mg  . clindamycin (CLEOCIN) capsule 300 mg  . DULoxetine (CYMBALTA) DR capsule 20 mg  . ferrous sulfate tablet 325 mg  . furosemide (LASIX) injection 80 mg  . hydrALAZINE (APRESOLINE) tablet 25 mg  . hydrocortisone (ANUSOL-HC) suppository 25 mg  . levothyroxine (SYNTHROID) tablet 50 mcg  . lisinopril (ZESTRIL) tablet 2.5 mg  . metoprolol succinate (TOPROL-XL) 24 hr tablet 50 mg  . morphine 2 MG/ML injection 2 mg  . nitroGLYCERIN (NITROSTAT) SL tablet 0.4 mg  . nystatin-triamcinolone ointment (MYCOLOG) 1 application  . ondansetron (ZOFRAN) injection 4 mg  . oxyCODONE-acetaminophen (PERCOCET/ROXICET) 5-325 MG per tablet 1 tablet  . sodium chloride flush (NS) 0.9 %  injection 10-40 mL  . spironolactone (ALDACTONE) tablet 50 mg  . traZODone (DESYREL) tablet 150 mg  . warfarin (COUMADIN) tablet 5 mg  . Warfarin - Pharmacist Dosing Inpatient   Outpatient Encounter Medications as of 01/26/2019  Medication Sig Note  . albuterol (VENTOLIN HFA) 108 (90 Base) MCG/ACT inhaler Inhale 2 puffs into the lungs every 6 (six) hours as needed for wheezing or shortness of breath.   . allopurinol (ZYLOPRIM) 100 MG tablet TAKE 1 TABLET BY MOUTH ONCE DAILY (Patient taking differently: Take 100 mg by mouth daily. )   . amiodarone (PACERONE) 200 MG tablet Take 1 tablet (200 mg total) by mouth daily.   [name] atorvastatin (LIPITOR) 40 MG tablet TAKE 1 TABLET BY MOUTH ONCE DAILY (Patient taking differently: Take 40 mg by mouth daily. )   . DULoxetine (CYMBALTA) 20 MG capsule Take 20 mg by mouth daily.   . ferrous sulfate 325 (65 FE) MG tablet Take 1 tablet (325 mg total) by mouth 2 (two) times daily with a meal. 01/21/2019: Been out of medication for at least one week  . hydrocortisone (ANUSOL-HC) 25 MG suppository Place 1 suppository (25 mg total) rectally 2 (two) times daily.   [name] levothyroxine (SYNTHROID) 50 MCG tablet Take 1 tablet (50 mcg total) by mouth daily before breakfast.   . lisinopril (ZESTRIL) 2.5 MG tablet TAKE 1 TABLET BY MOUTH ONCE DAILY (Patient taking differently: Take 2.5 mg by mouth daily. )   . meloxicam (MOBIC) 15 MG tablet Take 1 tablet (15 mg total) by mouth daily. (Patient not taking: Reported on 01/21/2019)   . metoprolol succinate (TOPROL-XL) 50 MG 24 hr tablet Take  50 mg by mouth 2 (two) times daily. Take with or immediately following a meal.   . nitroGLYCERIN (NITROSTAT) 0.4 MG SL tablet Place 1 tablet (0.4 mg total) under the tongue every 5 (five) minutes as needed for chest pain.   [name] nystatin-triamcinolone ointment (MYCOLOG) Apply 1 application topically 2 (two) times daily.   [name] spironolactone (ALDACTONE) 25 MG tablet TAKE 1 TABLET BY MOUTH ONCE DAILY  (Patient taking differently: Take 25 mg by mouth daily. )   . sulfamethoxazole-trimethoprim (BACTRIM DS) 800-160 MG tablet Take 1 tablet by mouth 2 (two) times daily.   [name] torsemide (DEMADEX) 20 MG tablet Take 2 tablets (40 mg total) by mouth 2 (two) times daily.   . traZODone (DESYREL) 150 MG tablet TAKE 1 TABLET BY MOUTH AT BEDTIME AS NEEDED SLEEP (Patient taking differently: Take 150 mg by mouth at bedtime. )   . warfarin (COUMADIN) 5 MG tablet Take 3 tablets (15 mg total) by mouth daily. (Patient taking differently: Take 10 mg by mouth daily. )      Goals Addressed            This Visit's Progress   . RN-I need help managing my overall health (pt-stated)       Current Barriers:  . Lacks caregiver support.  . Corporate treasurer.  . Literacy barriers . Transportation barriers . Difficulty obtaining medications . Cognitive Deficits . Chronic Disease Management support and education needs related to managing multiple chronic diseases as evidenced by multiple hospitalizations in the last 6 months.  Nurse Case Manager Clinical Goal(s):  [name] Over the next 90 days, patient will not experience hospital admission. Hospital Admissions in last 6 months = 5  Interventions:  . Discussed plans with patient for ongoing care management follow up and provided patient with direct contact information for care management team  . Patient remains admitted to the hospital for wound infection  to bilateral lower extremities.  Patient stated he is getting antibiotics and dressing changes . Patient stated he plans to see vein and vascular and ortho when he leaves the hospital also. . Patient stated he plan to see new PCP 12/15 at [facility].  . Patient in good spirits but does say his legs have been in a lot of pain.  . Patient states he does have transportation for when he is discharged from the hospital  Patient Self Care Activities:  . Currently UNABLE TO independently manage multiple  chronic disease processes as evidenced by 5 hospital admits in the last 6 mos  Please see past updates related to this goal by clicking on the "Past Updates" button in the selected goal  Previous goal of weight loss d/c'd related to patient's new focus          The patient has been provided with contact information for the care management team and has been advised to call with any health related questions or concerns.    Ma Rings Amiri Tritch RN, BSN Nurse Case Education officer, community Family Practice/[facility] Care Management  [phone]) Business Mobile

## 2019-01-26 NOTE — Progress Notes (Signed)
Ch checked on pt during unit rounds. Pt had a positive affect even though he was mildly disappointed that he was not being d/c yet. Pt was a/o in the recliner at bedside while he was being assessed by the nurse and unit leader. Ch will f/u at a later as pt was preparing for his lunch.   01/26/19 1300  Clinical Encounter Type  Visited With Patient;Health care provider  Visit Type Follow-up  Stress Factors  Patient Stress Factors Health changes;Loss  Family Stress Factors None identified

## 2019-01-26 NOTE — Progress Notes (Signed)
PROGRESS NOTE    [name] [name]  BOF:[id number] DOB: [birth year] DOA: 01/21/2019 PCP: Dorcas Carrow, DO   Brief Narrative:  [name] Hubbardis a 58 y.o.malewith medical history significant ofhypertension, hyperlipidemia, diet-controlled diabetes, COPD, hypothyroidism, depression, OSA on CPAP, atrial fibrillation on Coumadin, CAD,dCHF, PE/DVT on Coumadin, who presents with bilateral leg pain and chest pain and chronicbilateral lower leg pain,leg edemaand oozing lesions in both legs. Hehas been following at the wound clinic, and started onBactrim on 01/16/2019 without significant improvement.[name] when he was seen last week he was told he needed to go to the hospital for IV antibiotics.  Subjective: Patient continued to experience bilateral lower extremity pain, stating that he urinated very well yesterday after getting IV Lasix and feels that his legs are little better.  Assessment & Plan:   Principal Problem:   Cellulitis of lower extremity Active Problems:   Chronic systolic heart failure (HCC)   COPD (chronic obstructive pulmonary disease) (HCC)   Obstructive sleep apnea   Persistent atrial fibrillation (HCC)   HTN (hypertension)   Gout   Personal history of DVT (deep vein thrombosis)   Chronic atrial fibrillation (HCC)   Chest pain   Depression, major, single episode, moderate (HCC)   PE (pulmonary thromboembolism) (HCC)   Open leg wound   HLD (hyperlipidemia)   CAD (coronary artery disease)   Cellulitis  Cellulitis of lower extremities. Patient was initially treated with vancomycin and Rocephin and then switched to clindamycin.  Blood culture remain negative.  Urine culture with insignificant growth and wound culture with multiorganisms. Patient remained afebrile with no leukocytosis. -We will continue IV clindamycin today-can be switched to p.o. clindamycin for discharge. -Continue wound care.  Chronic systolic heart failure [facility]): Denies any chest pain  or shortness of breath.  Ejection fraction 25 to 30% during echo done in 03/24/2018. Patient has a weight gain of 25 to 30 pounds over the past month.  Per patient dry weight was around 375 pound.  Her lower extremity edema and erythema can be related to chronic venous congestion and volume overload. Cardiology was consulted to optimize his heart failure treatment-appreciate their recommendations. - home torsemide was discontinued yesterday. -Continue IV Lasix 80 mg twice daily. -Increase spironolactone to 50 mg daily. -Continue metoprolol and lisinopril. -Continue daily weight.  COPD (chronic obstructive pulmonary disease) (HCC):No acute exacerbation. -Continue home bronchodilators.  Obstructive sleep apnea. -Continue CPAP at night.  Persistent atrial fibrillation (HCC): -Continue Coumadin per pharm dosing -Continue metoprololandamiodarone  HTN (hypertension):Blood pressure stable -ontorsemide, spironolactone,metoprolol, lisinopril -Hold if running low.  Gout: -Allopurinol  Personal history of DVT (deep vein thrombosis)and PE: INR 2.9 on coumadin -on coumadin.  Chest painand elevated trop the time of admission and hx of CAD: Pain resolved - patient has a chronically elevated troponin, baseline troponin XVIII-31 recently. He is troponin is a 25 which is at baseline.  Depression, major, single episode, moderate (HCC): No acute issue -continue home meds  HLD (hyperlipidemia): -lipitor  Objective: Vitals:   01/26/19 0340 01/26/19 0342 01/26/19 0728 01/26/19 1511  BP:  122/60 127/67 (!) 102/56  Pulse:  64 65 64  Resp:  20    Temp:  98.3 F (36.8 C) 98.6 F (37 C)   TempSrc:  Oral Oral   SpO2:  96% 100% 99%  Weight: (!) 187.7 kg     Height:        Intake/Output Summary (Last 24 hours) at 01/26/2019 1640 Last data filed at 01/26/2019 1451 Gross per 24 hour  Intake 1200  ml  Output 4350 ml  Net -3150 ml   Filed Weights   01/24/19 0444 01/25/19 0346  01/26/19 0340  Weight: (!) 186.4 kg (!) 187.3 kg (!) 187.7 kg    Examination:  General exam: Morbidly obese gentleman, in no acute distress. Respiratory system: Clear to auscultation. Respiratory effort normal. Cardiovascular system: S1 & S2 heard, RRR. No JVD, murmurs, rubs, gallops or clicks. 2+ pedal edema. Gastrointestinal system: Abdomen is nondistended, soft and nontender. No organomegaly or masses felt. Normal bowel sounds heard. Central nervous system: Alert and oriented. No focal neurological deficits. Extremities: Symmetric 5 x 5 power.  Bilateral lower extremity erythema and edema .  Both extremities with Unna wraps. Psychiatry: Judgement and insight appear normal. Mood & affect appropriate.   DVT prophylaxis: Coumadin Code Status: Full Family Communication: No family at bedside Disposition Plan: Pending improvement.  Consultants:  Cardiology  Procedures:  Antimicrobials:  Clindamycin.   Data Reviewed: I have personally reviewed following labs and imaging studies  CBC: Recent Labs  Lab 01/21/19 1220 01/23/19 1121 01/24/19 0610 01/25/19 0417  WBC 7.2 5.8 6.4 6.3  NEUTROABS 5.3 4.3  --   --   HGB 10.9* 12.0* 10.2* 10.1*  HCT 33.5* 37.0* 31.3* 30.6*  MCV 92.5 93.4 92.6 89.2  PLT 321 295 253 247   Basic Metabolic Panel: Recent Labs  Lab 01/21/19 1220 01/23/19 1121 01/25/19 0417 01/26/19 0525  NA 135 132* 134* 135  K 3.9 4.2 4.0 4.0  CL 99 92* 94* 96*  CO2 24 28 29  34*  GLUCOSE 115* 149* 164* 125*  BUN 17 13 21* 20  CREATININE 1.14 1.05 1.15 1.10  CALCIUM 8.4* 8.7* 8.4* 8.5*  MG  --  1.6* 1.8  --    GFR: Estimated Creatinine Clearance: 120.2 mL/min (by C-G formula based on SCr of 1.1 mg/dL). Liver Function Tests: No results for input(s): AST, ALT, ALKPHOS, BILITOT, PROT, ALBUMIN in the last 168 hours. No results for input(s): LIPASE, AMYLASE in the last 168 hours. No results for input(s): AMMONIA in the last 168 hours. Coagulation  Profile: Recent Labs  Lab 01/22/19 0346 01/23/19 0608 01/24/19 0610 01/25/19 0417 01/26/19 0525  INR 2.7* 3.4* 3.0* 2.6* 2.6*   Cardiac Enzymes: No results for input(s): CKTOTAL, CKMB, CKMBINDEX, TROPONINI in the last 168 hours. BNP (last 3 results) No results for input(s): PROBNP in the last 8760 hours. HbA1C: No results for input(s): HGBA1C in the last 72 hours. CBG: Recent Labs  Lab 01/23/19 0732  GLUCAP 115*   Lipid Profile: No results for input(s): CHOL, HDL, LDLCALC, TRIG, CHOLHDL, LDLDIRECT in the last 72 hours. Thyroid Function Tests: No results for input(s): TSH, T4TOTAL, FREET4, T3FREE, THYROIDAB in the last 72 hours. Anemia Panel: No results for input(s): VITAMINB12, FOLATE, FERRITIN, TIBC, IRON, RETICCTPCT in the last 72 hours. Sepsis Labs: Recent Labs  Lab 01/21/19 1220 01/21/19 1421 01/21/19 1601 01/21/19 1740  PROCALCITON  --   --  <0.10  --   LATICACIDVEN 2.4* 2.1* 2.2* 2.4*    Recent Results (from the past 240 hour(s))  Aerobic Culture (superficial specimen)     Status: Abnormal   Collection Time: 01/18/19 10:52 AM   Specimen: Leg  Result Value Ref Range Status   Specimen Description LEG LEFT  Final   Special Requests NONE  Final   Gram Stain   Final    WBC PRESENT, PREDOMINANTLY MONONUCLEAR MODERATE GRAM NEGATIVE RODS MODERATE GRAM POSITIVE COCCI Performed at [facility], [address] [place], [place] [address]    Culture MULTIPLE ORGANISMS PRESENT, NONE PREDOMINANT (A)  Final   Report Status 01/21/2019 FINAL  Final  Urine culture     Status: Abnormal   Collection Time: 01/21/19 12:15 PM   Specimen: Urine, Random  Result Value Ref Range Status   Specimen Description   Final    URINE, RANDOM Performed at [facility], [address] [place], [place] [address]    Special Requests   Final    NONE Performed at [facility], [address] [place], [place] [address]    Culture (A)  Final    <10,000  COLONIES/mL INSIGNIFICANT GROWTH Performed at [facility], [address], [place], [place] [address]    Report Status 01/22/2019 FINAL  Final  Culture, blood (routine x 2)     Status: None   Collection Time: 01/21/19 12:20 PM   Specimen: BLOOD  Result Value Ref Range Status   Specimen Description BLOOD RIGHT ANTECUBITAL  Final   Special Requests   Final    BOTTLES DRAWN AEROBIC AND ANAEROBIC Blood Culture results may not be optimal due to an excessive volume of blood received in culture bottles   Culture   Final    NO GROWTH 5 DAYS Performed at [facility], [address] [place], [place] [address]    Report Status 01/26/2019 FINAL  Final  Culture, blood (routine x 2)     Status: None   Collection Time: 01/21/19 12:21 PM   Specimen: BLOOD  Result Value Ref Range Status   Specimen Description BLOOD LEFT ANTECUBITAL  Final   Special Requests   Final    BOTTLES DRAWN AEROBIC AND ANAEROBIC Blood Culture adequate volume   Culture   Final    NO GROWTH 5 DAYS Performed at [facility], [address], [place], [place] [address]    Report Status 01/26/2019 FINAL  Final  SARS CORONAVIRUS 2 (TAT 6-24 HRS) Nasopharyngeal Nasopharyngeal Swab     Status: None   Collection Time: 01/21/19  1:17 PM   Specimen: Nasopharyngeal Swab  Result Value Ref Range Status   SARS Coronavirus 2 NEGATIVE NEGATIVE Final    Comment: (NOTE) SARS-CoV-2 target nucleic acids are NOT DETECTED. The SARS-CoV-2 RNA is generally detectable in upper and lower respiratory specimens during the acute phase of infection. Negative results do not preclude SARS-CoV-2 infection, do not rule out co-infections with other pathogens, and should not be used as the sole basis for treatment or other patient management decisions. Negative results must be combined with clinical observations, patient history, and epidemiological information. The expected result is Negative. Fact Sheet for  Patients: HairSlick.no Fact Sheet for Healthcare Providers: quierodirigir.com This test is not yet approved or cleared by the [place] FDA and  has been authorized for detection and/or diagnosis of SARS-CoV-2 by FDA under an Emergency Use Authorization (EUA). This EUA will remain  in effect (meaning this test can be used) for the duration of the COVID-19 declaration under Section 56 4(b)(1) of the Act, 21 U.S.C. section 360bbb-3(b)(1), unless the authorization is terminated or revoked sooner. Performed at [facility], [address] [place], [place] [address]      Radiology Studies: No results found.  Scheduled Meds: . acidophilus  1 capsule Oral BID  . allopurinol  100 mg Oral Daily  . amiodarone  200 mg Oral Daily  . atorvastatin  40 mg Oral Daily  . clindamycin  300 mg  Oral Q6H  . DULoxetine  20 mg Oral Daily  . ferrous sulfate  325 mg Oral BID WC  . furosemide  80 mg Intravenous BID  . hydrocortisone  25 mg Rectal BID  . levothyroxine  50 mcg Oral QAC breakfast  . lisinopril  2.5 mg Oral Daily  . metoprolol succinate  50 mg Oral BID  . nystatin-triamcinolone ointment  1 application Topical BID  . spironolactone  50 mg Oral Daily  . traZODone  150 mg Oral QHS  . warfarin  5 mg Oral ONCE-1800  . Warfarin - Pharmacist Dosing Inpatient   Does not apply q1800   Continuous Infusions: . sodium chloride 10 mL/hr at 01/24/19 2205     LOS: 4 days   Time spent: 35 minutes.   Lorella Nimrod, MD [facility] Pager [phone]  If 7PM-7AM, please contact night-coverage [email or web address] Password [facility] 01/26/2019, 4:40 PM   This record has been created using Systems analyst. Errors have been sought and corrected,but may not always be located. Such creation errors do not reflect on the standard of care.

## 2019-01-26 NOTE — Progress Notes (Signed)
ANTICOAGULATION CONSULT NOTE   Pharmacy Consult for Warfarin  Indication: atrial fibrillation  No Known Allergies  Patient Measurements: Height: 5\' 8"  (172.7 cm) Weight: (!) 413 lb 11.2 oz (187.7 kg) IBW/kg (Calculated) : 68.4  Vital Signs: Temp: 98.6 F (37 C) (12/11 0728) Temp Source: Oral (12/11 0728) BP: 127/67 (12/11 0728) Pulse Rate: 65 (12/11 0728)  Labs: Recent Labs    01/23/19 1121 01/24/19 0610 01/25/19 0417 01/26/19 0525  HGB 12.0* 10.2* 10.1*  --   HCT 37.0* 31.3* 30.6*  --   PLT 295 253 247  --   LABPROT  --  30.7* 28.1* 27.4*  INR  --  3.0* 2.6* 2.6*  CREATININE 1.05  --  1.15 1.10    Estimated Creatinine Clearance: 120.2 mL/min (by C-G formula based on SCr of 1.1 mg/dL).   Medical History: Past Medical History:  Diagnosis Date  . Asthma   . Brain damage   . Chronic pain of both knees 07/13/2018  . COPD (chronic obstructive pulmonary disease) (HCC)   . Depression   . HFrEF (heart failure with reduced ejection fraction) (HCC)    a. 03/2018 Echo: EF 25-30%, diff HK. Mod dil LA.  04/2018 Hypertension   . MI (myocardial infarction) (HCC)   . Neck pain 07/13/2018  . Persistent atrial fibrillation (HCC)    a. 03/2018 s/p DCCV; b. CHA2DS2VASc = 1-->Xarelto; c. 05/2018 recurrent afib-->Amio initiated;   . Sleep apnea     Medications:  Medications Prior to Admission  Medication Sig Dispense Refill Last Dose  . allopurinol (ZYLOPRIM) 100 MG tablet TAKE 1 TABLET BY MOUTH ONCE DAILY (Patient taking differently: Take 100 mg by mouth daily. ) 30 tablet 0 01/20/2019 at 0800  . amiodarone (PACERONE) 200 MG tablet Take 1 tablet (200 mg total) by mouth daily. 30 tablet 0 01/20/2019 at 0800  . atorvastatin (LIPITOR) 40 MG tablet TAKE 1 TABLET BY MOUTH ONCE DAILY (Patient taking differently: Take 40 mg by mouth daily. ) 30 tablet 2 01/20/2019 at 0800  . DULoxetine (CYMBALTA) 20 MG capsule Take 20 mg by mouth daily.   01/20/2019 at 0800  . levothyroxine (SYNTHROID) 50 MCG  tablet Take 1 tablet (50 mcg total) by mouth daily before breakfast. 30 tablet 1 01/20/2019 at 0700  . lisinopril (ZESTRIL) 2.5 MG tablet TAKE 1 TABLET BY MOUTH ONCE DAILY (Patient taking differently: Take 2.5 mg by mouth daily. ) 30 tablet 0 01/20/2019 at 0800  . metoprolol succinate (TOPROL-XL) 50 MG 24 hr tablet Take 50 mg by mouth 2 (two) times daily. Take with or immediately following a meal.   01/20/2019 at 1800  . spironolactone (ALDACTONE) 25 MG tablet TAKE 1 TABLET BY MOUTH ONCE DAILY (Patient taking differently: Take 25 mg by mouth daily. ) 30 tablet 3 01/20/2019 at 0800  . sulfamethoxazole-trimethoprim (BACTRIM DS) 800-160 MG tablet Take 1 tablet by mouth 2 (two) times daily. 14 tablet 0 01/20/2019 at 0800  . torsemide (DEMADEX) 20 MG tablet Take 2 tablets (40 mg total) by mouth 2 (two) times daily. 120 tablet 2 01/20/2019 at 0800  . traZODone (DESYREL) 150 MG tablet TAKE 1 TABLET BY MOUTH AT BEDTIME AS NEEDED SLEEP (Patient taking differently: Take 150 mg by mouth at bedtime. ) 30 tablet 2 01/20/2019 at 2100  . warfarin (COUMADIN) 5 MG tablet Take 3 tablets (15 mg total) by mouth daily. (Patient taking differently: Take 10 mg by mouth daily. ) 90 tablet 1 01/20/2019 at 1800  . albuterol (VENTOLIN HFA) 108 (  90 Base) MCG/ACT inhaler Inhale 2 puffs into the lungs every 6 (six) hours as needed for wheezing or shortness of breath. 1 Inhaler 3 prn at prn  . ferrous sulfate 325 (65 FE) MG tablet Take 1 tablet (325 mg total) by mouth 2 (two) times daily with a meal. 60 tablet 3   . hydrocortisone (ANUSOL-HC) 25 MG suppository Place 1 suppository (25 mg total) rectally 2 (two) times daily. 12 suppository 12 prn at prn  . meloxicam (MOBIC) 15 MG tablet Take 1 tablet (15 mg total) by mouth daily. (Patient not taking: Reported on 01/21/2019) 90 tablet 1 Not Taking at Unknown time  . nitroGLYCERIN (NITROSTAT) 0.4 MG SL tablet Place 1 tablet (0.4 mg total) under the tongue every 5 (five) minutes as needed for chest  pain. 15 tablet 12 prn at prn  . nystatin-triamcinolone ointment (MYCOLOG) Apply 1 application topically 2 (two) times daily. 30 g 0 prn at prn    Assessment: Pharmacy consulted to dose warfarin in this 58 year old male admitted with cellulitis, PMH of PE/DVT and Afib. Pt was on warfarin 10 mg PO daily at home, last dose was on 12/5 @ 1800. Clindamycin started yesterday, though this is not an antibiotic that typically increases the INR. INR could be elevated d/t infection or other causes. DDI: pt is on amiodarone (home med).   Date INR Warfarin Dose    12/6 2.9 10 mg  12/7 2.7 Not given  12/8 3.4       HELD 12/9     3.0       5 mg 12/10   2.6       5 mg 12/11   2.6       5 mg  Goal of Therapy:  INR 2-3   Plan:  INR is therapeutic. Will give pt 5 mg tonight again as INR seems to be stable. Daily INR ordered. Check CBC every 3 days. Hgb trended down from 12 > 10.2 but seems to be stable.    If discharging patient today: recommend decreasing warfarin dose to 5 mg daily (~50% decrease from home dose). If INR starts to trend down increase dose to 8 mg (~20% of home dose). Have INR follow up in the next 3-5 days.   Oswald Hillock, PharmD, BCPS 01/26/2019,7:41 AM

## 2019-01-26 NOTE — Progress Notes (Signed)
Pt refused wound care x 3 despite education. Will notify incoming shift. Will continue to monitor.

## 2019-01-26 NOTE — Consult Note (Signed)
Cardiology Consult    Patient ID: [name] [name] MRN: [id number], DOB/AGE: 1960-08-03   Admit date: 01/21/2019 Date of Consult: 01/26/2019  Primary Physician: [name], DO Primary Cardiologist: Nelva Bush, MD Requesting Provider: Chauncey Cruel. Reesa Chew, MD  Patient Profile    [name] [name] is a 58 y.o. male with a history of chronic combined CHF, NICM, HTN, persistent Afib on amio, DVT/PE on coumadin, OSA, obesity, and chronic lower ext edema w/ cellulitis, who is being seen today for the evaluation of CHF at the request of Dr. Reesa Chew.  Past Medical History   Past Medical History:  Diagnosis Date  . Asthma   . Brain damage   . Chronic pain of both knees 07/13/2018  . COPD (chronic obstructive pulmonary disease) ([place])   . Depression   . HFrEF (heart failure with reduced ejection fraction) ([place])    a. 03/2018 Echo: EF 25-30%, diff HK. Mod dil LA.  [name] History of DVT (deep vein thrombosis)   . History of pulmonary embolism    a. Chronic coumadin.  [name] Hypertension   . MI (myocardial infarction) ([place])   . Morbid obesity ([place])   . Neck pain 07/13/2018  . NICM (nonischemic cardiomyopathy) ([place])    a. s/p Cath x 3 - reportedly nl cors. Last cath 2019 in [place].  [name] Persistent atrial fibrillation ([place])    a. 03/2018 s/p DCCV; b. CHA2DS2VASc = 1-->Xarelto; c. 05/2018 recurrent afib-->Amio initiated;   . Sleep apnea     Past Surgical History:  Procedure Laterality Date  . CARDIOVERSION N/A 03/24/2018   Procedure: CARDIOVERSION;  Surgeon: Minna Merritts, MD;  Location: [facility] ORS;  Service: Cardiovascular;  Laterality: N/A;  . CARDIOVERSION N/A 08/08/2018   Procedure: CARDIOVERSION;  Surgeon: Nelva Bush, MD;  Location: [facility] ORS;  Service: Cardiovascular;  Laterality: N/A;  . hearnia repair     X 3- total of two surgeries  . LEG SURGERY       Allergies  No Known Allergies  History of Present Illness    58 y/o ? with the above PMH including NICM and chronic combined syst/diast CHF  (EF 25-30% 03/2018, reportedly nl cors by cath 2019), persistent afib on amio, COPD, DVT/PE on chronic coumadin, OSA, obesity, chronic lower ext edema, and recurrent cellulitis.  He was last seen in clinic on 12/2, at which time, he was volume overloaded w/ wt of 169 kg.  Torsemide was inc to 40 BID w/ plan for early f/u.  Unfortunately, he was admitted 12/6 in the setting of reproducible left chest tenderness//pain and worsening lower ext edema and erythmema despite oral abx.  He was admitted for IV abx related to cellulitis.  HsTrop was mildly elevated w/ flat trend, peak 32.  C/p has since resolved.  Since admission, wt has climbed to 187.7 kg by bedscale.  With this, he has had persistent/worsening lower ext edema.  He hasn't noted much change in abd girth or dyspnea, though hasn't really been ambulating.  Despite massive wt gain, he is listed as being net neg of 9.2 L since admission.  Torsemide was changed to IV lasix.    Inpatient Medications    . acidophilus  1 capsule Oral BID  . allopurinol  100 mg Oral Daily  . amiodarone  200 mg Oral Daily  . atorvastatin  40 mg Oral Daily  . clindamycin  300 mg Oral Q6H  . DULoxetine  20 mg Oral Daily  . ferrous sulfate  325 mg Oral BID WC  .  furosemide  80 mg Intravenous BID  . hydrocortisone  25 mg Rectal BID  . levothyroxine  50 mcg Oral QAC breakfast  . lisinopril  2.5 mg Oral Daily  . metoprolol succinate  50 mg Oral BID  . nystatin-triamcinolone ointment  1 application Topical BID  . spironolactone  50 mg Oral Daily  . traZODone  150 mg Oral QHS  . warfarin  5 mg Oral ONCE-1800  . Warfarin - Pharmacist Dosing Inpatient   Does not apply q1800    Family History    Family History  Problem Relation Age of Onset  . Heart failure Mother   . Lung cancer Mother   . Lung cancer Father   . Heart attack Maternal Grandmother   . Heart attack Maternal Grandfather    He indicated that his mother is deceased. He indicated that his father is  deceased. He indicated that both of his sisters are alive. He indicated that his maternal grandmother is deceased. He indicated that his maternal grandfather is deceased. He indicated that his paternal grandmother is deceased. He indicated that his paternal grandfather is deceased.   Social History    Social History   Socioeconomic History  . Marital status: Single    Spouse name: Not on file  . Number of children: Not on file  . Years of education: Not on file  . Highest education level: Not on file  Occupational History  . Not on file  Tobacco Use  . Smoking status: Never Smoker  . Smokeless tobacco: Never Used  Substance and Sexual Activity  . Alcohol use: Never  . Drug use: Never  . Sexual activity: Not Currently  Other Topics Concern  . Not on file  Social History Narrative  . Not on file   Social Determinants of Health   Financial Resource Strain:   . Difficulty of Paying Living Expenses: Not on file  Food Insecurity:   . Worried About Programme researcher, broadcasting/film/videounning Out of Food in the Last Year: Not on file  . Ran Out of Food in the Last Year: Not on file  Transportation Needs:   . Lack of Transportation (Medical): Not on file  . Lack of Transportation (Non-Medical): Not on file  Physical Activity:   . Days of Exercise per Week: Not on file  . Minutes of Exercise per Session: Not on file  Stress:   . Feeling of Stress : Not on file  Social Connections:   . Frequency of Communication with Friends and Family: Not on file  . Frequency of Social Gatherings with Friends and Family: Not on file  . Attends Religious Services: Not on file  . Active Member of Clubs or Organizations: Not on file  . Attends BankerClub or Organization Meetings: Not on file  . Marital Status: Not on file  Intimate Partner Violence:   . Fear of Current or Ex-Partner: Not on file  . Emotionally Abused: Not on file  . Physically Abused: Not on file  . Sexually Abused: Not on file   HIs wife died in April.  He has been  depressed since.  Review of Systems    General:  No chills, fever, night sweats or weight changes.  Cardiovascular:  +++ msk chest pain on admission, +++ dyspnea on exertion, +++ LE edema, +++ orthopnea, no palpitations, paroxysmal nocturnal dyspnea. Dermatological: No rash, lesions/masses Respiratory: No cough, +++ dyspnea Urologic: No hematuria, dysuria Abdominal:   No nausea, vomiting, diarrhea, bright red blood per rectum, melena, or hematemesis Neurologic:  No visual changes, wkns, changes in mental status. All other systems reviewed and are otherwise negative except as noted above.  Physical Exam    Blood pressure 127/67, pulse 65, temperature 98.6 F (37 C), temperature source Oral, resp. rate 20, height 5\' 8"  (1.727 m), weight (!) 187.7 kg, SpO2 100 %.  General: Pleasant, NAD Psych: Normal affect. Neuro: Alert and oriented X 3. Moves all extremities spontaneously. HEENT: Normal  Neck: Supple without bruits.  Obese, difficult to gauge JVP. Lungs:  Resp regular and unlabored, CTA. Heart: RRR no s3, s4, or murmurs. Abdomen: Obese, soft, non-tender, non-distended, BS + x 4.  Extremities: No clubbing, cyanosis.  3+ bilat LE edema to knees. DP/PT/Radials 1+ and equal bilaterally.  Labs    Cardiac Enzymes Recent Labs  Lab 01/21/19 1220 01/21/19 1421 01/21/19 1601 01/21/19 1823 01/21/19 2126  TROPONINIHS 25* 31* 32* 30* 26*      Lab Results  Component Value Date   WBC 6.3 01/25/2019   HGB 10.1 (L) 01/25/2019   HCT 30.6 (L) 01/25/2019   MCV 89.2 01/25/2019   PLT 247 01/25/2019    Recent Labs  Lab 01/26/19 0525  NA 135  K 4.0  CL 96*  CO2 34*  BUN 20  CREATININE 1.10  CALCIUM 8.5*  GLUCOSE 125*   Lab Results  Component Value Date   CHOL 153 01/22/2019   HDL 48 01/22/2019   LDLCALC 80 01/22/2019   TRIG 125 01/22/2019     Lab Results  Component Value Date   INR 2.6 (H) 01/26/2019   INR 2.6 (H) 01/25/2019   INR 3.0 (H) 01/24/2019    Radiology  Studies    LE Doppler in [facility] Venous Img Lower Bilateral (DVT)  Result Date: 01/21/2019 CLINICAL DATA:  Cellulitis, pain and edema EXAM: BILATERAL LOWER EXTREMITY VENOUS DOPPLER ULTRASOUND TECHNIQUE: Gray-scale sonography with graded compression, as well as color Doppler and duplex ultrasound were performed to evaluate the lower extremity deep venous systems from the level of the common femoral vein and including the common femoral, femoral, profunda femoral, popliteal and calf veins including the posterior tibial, peroneal and gastrocnemius veins when visible. The superficial great saphenous vein was also interrogated. Spectral Doppler was utilized to evaluate flow at rest and with distal augmentation maneuvers in the common femoral, femoral and popliteal veins. COMPARISON:  None. FINDINGS: RIGHT LOWER EXTREMITY Common Femoral Vein: No evidence of thrombus. Normal compressibility, respiratory phasicity and response to augmentation. Saphenofemoral Junction: No evidence of thrombus. Normal compressibility and flow on color Doppler imaging. Profunda Femoral Vein: No evidence of thrombus. Normal compressibility and flow on color Doppler imaging. Femoral Vein: No evidence of thrombus. Normal compressibility, respiratory phasicity and response to augmentation. Popliteal Vein: Limited due to patient body habitus. Given this limitation, no evidence of thrombus. Normal compressibility, respiratory phasicity and response to augmentation. Calf Veins: Limited due to patient body habitus. Given this limitation, no evidence of thrombus. Normal compressibility and flow on color Doppler imaging. Superficial Great Saphenous Vein: No evidence of thrombus. Normal compressibility. Venous Reflux:  None. Other Findings:  None. LEFT LOWER EXTREMITY Common Femoral Vein: No evidence of thrombus. Normal compressibility, respiratory phasicity and response to augmentation. Saphenofemoral Junction: No evidence of thrombus. Normal  compressibility and flow on color Doppler imaging. Profunda Femoral Vein: No evidence of thrombus. Normal compressibility and flow on color Doppler imaging. Femoral Vein: No evidence of thrombus. Normal compressibility, respiratory phasicity and response to augmentation. Popliteal Vein: Limited due to patient body habitus. Given  this limitation, no evidence of thrombus. Normal compressibility, respiratory phasicity and response to augmentation. Calf Veins: Limited due to patient body habitus. Given this limitation, no evidence of thrombus. Normal compressibility and flow on color Doppler imaging. Superficial Great Saphenous Vein: No evidence of thrombus. Normal compressibility. Venous Reflux:  None. Other Findings:  None. IMPRESSION: No evidence of deep venous thrombosis in either lower extremity. Electronically Signed   By: Romona Curls M.D.   On: 01/21/2019 17:40   DG Chest Portable 1 View  Result Date: 01/21/2019 CLINICAL DATA:  Chest pain. EXAM: PORTABLE CHEST 1 VIEW COMPARISON:  10/23/2018 FINDINGS: Stable cardiac enlargement. No pleural effusion or edema. No airspace opacities identified. Osseous structures are unremarkable. IMPRESSION: No acute cardiopulmonary abnormalities. Electronically Signed   By: Signa Kell M.D.   On: 01/21/2019 13:13    ECG & Cardiac Imaging    RSR, 69, Rward axis, IVCD, prior lat infarct - personally reviewed.  Assessment & Plan    1.  Acute on chronic combined syst/diast CHF/NICM:  EF25-30% in Feb 2020. Pt recently seen 12/2 and noted to be volume overloaded w/ rec for inc torsemide to 40 BID.  Unfortunately, he was admitted 12/6 w/ msk c/p and lower ext cellulitis.  Since admission, wt has climbed significantly, despite oral torsemide, and he was switched to IV lasix yesterday.  He is massively volume overloaded on exam.  Agree w/ aggressive IV diuresis - 80 BID.  As he is currently about 30 lbs above his dry wt, he will likely require several days in the hospital  to adequately diurese.  Cont  blocker, acei, spiro.  2.  Lower ext cellulitis:  In setting of volume overload.  Diuresis as above.  Abx per IM.  3.  Persistent Afib:  Maintaining sinus on oral amio.  Chronic coumadin.  INR Rx.  4.  Essential HTN:  Stable.  5.  H/o DVT/PE:  INR rx.  6.  HL:  Cont statin.  LDL 80 12/7.  Reportedly nl cors on cath a year ago.  7.  Normocytic anemia:  H/H drifting down.  Hgb 13.1 in Oct, now 10.1.  ? 2/2 blood draws, dilution vs bleeding in setting of chronic coumadin.  Will guaiac stools.   8.  OSA:  Uses CPAP.  9.  Elevated troponin:  Nonspecific in setting of CHF w/ flat trend and peak of 32.  C/p on presentation was atypical and reproducible w/ palpation.  H/o nl cors on cath last year per pt.  Would not pursue further ischemic eval.  Signed, Nicolasa Ducking, NP 01/26/2019, 10:32 AM  For questions or updates, please contact   Please consult [email or web address] for contact info under Cardiology/STEMI.

## 2019-01-27 DIAGNOSIS — S069X9S Unspecified intracranial injury with loss of consciousness of unspecified duration, sequela: Secondary | ICD-10-CM | POA: Diagnosis not present

## 2019-01-27 DIAGNOSIS — M1A9XX0 Chronic gout, unspecified, without tophus (tophi): Secondary | ICD-10-CM

## 2019-01-27 DIAGNOSIS — Z86718 Personal history of other venous thrombosis and embolism: Secondary | ICD-10-CM

## 2019-01-27 LAB — BASIC METABOLIC PANEL
Anion gap: 10 (ref 5–15)
BUN: 28 mg/dL — ABNORMAL HIGH (ref 6–20)
CO2: 32 mmol/L (ref 22–32)
Calcium: 8.8 mg/dL — ABNORMAL LOW (ref 8.9–10.3)
Chloride: 93 mmol/L — ABNORMAL LOW (ref 98–111)
Creatinine, Ser: 1.16 mg/dL (ref 0.61–1.24)
GFR calc Af Amer: >60 mL/min (ref >60)
GFR calc non Af Amer: >60 mL/min (ref >60)
Glucose, Bld: 109 mg/dL — ABNORMAL HIGH (ref 70–99)
Potassium: 4.1 mmol/L (ref 3.5–5.1)
Sodium: 135 mmol/L (ref 135–145)

## 2019-01-27 LAB — PROTIME-INR
INR: 2.1 — ABNORMAL HIGH (ref 0.8–1.2)
Prothrombin Time: 23.1 seconds — ABNORMAL HIGH (ref 11.4–15.2)

## 2019-01-27 MED ORDER — CLINDAMYCIN HCL 300 MG PO CAPS: 300.0000 mg | ORAL_CAPSULE | Freq: Four times a day (QID) | ORAL | 0 refills | Status: AC

## 2019-01-27 MED ORDER — TORSEMIDE 20 MG PO TABS: 60.0000 mg | ORAL_TABLET | Freq: Two times a day (BID) | ORAL | 2 refills | Status: DC

## 2019-01-27 NOTE — Discharge Summary (Signed)
Physician Discharge Summary  [name] [name] ZOX:[id number] DOB: 1960/09/14 DOA: 01/21/2019  PCP: [name], DO  Admit date: 01/21/2019 Discharge date: 01/27/2019  Admitted From: Home Disposition: Home  Recommendations for Outpatient Follow-up:  1. Follow up with PCP in 1-2 weeks 2. Please follow-up with your cardiologist within 1 week. 3. Please obtain BMP/CBC in one week 4. Please follow up on the following pending results: None  Home Health: Yes Equipment/Devices: None Discharge Condition: Stable CODE STATUS: Full Diet recommendation: Heart Healthy / Carb Modified   Brief/Interim Summary: [name] Hubbardis a 58 y.o.malewith medical history significant ofhypertension, hyperlipidemia, diet-controlled diabetes, COPD, hypothyroidism, depression, OSA on CPAP, atrial fibrillation on Coumadin, CAD,dCHF, PE/DVT on Coumadin, who presents with bilateral leg pain and chest pain and chronicbilateral lower leg pain,leg edemaand oozing lesions in both legs. Hehas been following at the wound clinic, and started onBactrim on 01/16/2019 without significant improvement.[name] when he was seen last week he was told he needed to go to the hospital for IV antibiotics. He was treated with IV clindamycin for 4 days and then switched to p.o. to complete a 10-day course.  He remained afebrile with no leukocytosis. Patient had chronic lymphedema and lower extremity venous stasis with heart failure.  He also gained about 25 to 30 pounds weight over the period of few weeks.  His worsening lower extremity edema is playing a role in his lower extremity wounds.  His home torsemide was converted to IV Lasix 80 mg twice daily which helped him diuresed well.  On discharge his weight was 408 pound which is still higher than his dry weight of 375 to 380 pounds.  Patient really wants to be discharged.  We discharged him with a increased dose of torsemide from 40 to 60 mg twice daily.  He was advised to  follow-up with his cardiologist within 1 week.  Patient will resume care with [facility] home [facility] for his lower extremity wounds.  He will continue rest of his home meds.  Discharge Diagnoses:  Principal Problem:   Cellulitis of lower extremity Active Problems:   Chronic systolic heart failure (HCC)   COPD (chronic obstructive pulmonary disease) (HCC)   Obstructive sleep apnea   Persistent atrial fibrillation (HCC)   HTN (hypertension)   Gout   Personal history of DVT (deep vein thrombosis)   Chronic atrial fibrillation (HCC)   Chest pain   Depression, major, single episode, moderate (HCC)   PE (pulmonary thromboembolism) (HCC)   Open leg wound   HLD (hyperlipidemia)   CAD (coronary artery disease)   Cellulitis   Discharge Instructions  Discharge Instructions    (HEART FAILURE PATIENTS) Call MD:  Anytime you have any of the following symptoms: 1) 3 pound weight gain in 24 hours or 5 pounds in 1 week 2) shortness of breath, with or without a dry hacking cough 3) swelling in the hands, feet or stomach 4) if you have to sleep on extra pillows at night in order to breathe.   Complete by: As directed    Diet - low sodium heart healthy   Complete by: As directed    Discharge instructions   Complete by: As directed    It was a pleasure taking care of you. Increasing the dose of torsemide from 40 mg to 60 mg twice daily.  You can take it 1 in the morning and 1 around 2 PM. Please call your home health care at [facility] to resume your wound care. Please see your cardiologist within 1  week. His weight yourself daily, if you see a weight increase of 3 pounds in 1 day or 5 pound in 1 week take an extra dose of torsemide. I am also giving you 4 more days of clindamycin to finish your antibiotic course.   Increase activity slowly   Complete by: As directed      Allergies as of 01/27/2019   No Known Allergies     Medication List    STOP taking these medications    sulfamethoxazole-trimethoprim 800-160 MG tablet Commonly known as: BACTRIM DS     TAKE these medications   albuterol 108 (90 Base) MCG/ACT inhaler Commonly known as: VENTOLIN HFA Inhale 2 puffs into the lungs every 6 (six) hours as needed for wheezing or shortness of breath.   allopurinol 100 MG tablet Commonly known as: ZYLOPRIM TAKE 1 TABLET BY MOUTH ONCE DAILY   amiodarone 200 MG tablet Commonly known as: PACERONE Take 1 tablet (200 mg total) by mouth daily.   atorvastatin 40 MG tablet Commonly known as: LIPITOR TAKE 1 TABLET BY MOUTH ONCE DAILY   clindamycin 300 MG capsule Commonly known as: CLEOCIN Take 1 capsule (300 mg total) by mouth every 6 (six) hours for 4 days.   DULoxetine 20 MG capsule Commonly known as: CYMBALTA Take 20 mg by mouth daily.   ferrous sulfate 325 (65 FE) MG tablet Take 1 tablet (325 mg total) by mouth 2 (two) times daily with a meal.   hydrocortisone 25 MG suppository Commonly known as: ANUSOL-HC Place 1 suppository (25 mg total) rectally 2 (two) times daily.   levothyroxine 50 MCG tablet Commonly known as: SYNTHROID Take 1 tablet (50 mcg total) by mouth daily before breakfast.   lisinopril 2.5 MG tablet Commonly known as: ZESTRIL TAKE 1 TABLET BY MOUTH ONCE DAILY   meloxicam 15 MG tablet Commonly known as: MOBIC Take 1 tablet (15 mg total) by mouth daily.   metoprolol succinate 50 MG 24 hr tablet Commonly known as: TOPROL-XL Take 50 mg by mouth 2 (two) times daily. Take with or immediately following a meal.   nitroGLYCERIN 0.4 MG SL tablet Commonly known as: NITROSTAT Place 1 tablet (0.4 mg total) under the tongue every 5 (five) minutes as needed for chest pain.   nystatin-triamcinolone ointment Commonly known as: MYCOLOG Apply 1 application topically 2 (two) times daily.   spironolactone 25 MG tablet Commonly known as: ALDACTONE TAKE 1 TABLET BY MOUTH ONCE DAILY   torsemide 20 MG tablet Commonly known as: DEMADEX Take  3 tablets (60 mg total) by mouth 2 (two) times daily. What changed: how much to take   traZODone 150 MG tablet Commonly known as: DESYREL TAKE 1 TABLET BY MOUTH AT BEDTIME AS NEEDED SLEEP What changed: See the new instructions.   warfarin 5 MG tablet Commonly known as: Coumadin Take 3 tablets (15 mg total) by mouth daily. What changed: how much to take       No Known Allergies  Consultations:  Cardiology  Procedures/Studies: LE Doppler in [facility] (US Venous Img Lower Bilateral (DVT)  Result Date: 01/21/2019 CLINICAL DATA:  Cellulitis, pain and edema EXAM: BILATERAL LOWER EXTREMITY VENOUS DOPPLER ULTRASOUND TECHNIQUE: Gray-scale sonography with graded compression, as well as color Doppler and duplex ultrasound were performed to evaluate the lower extremity deep venous systems from the level of the common femoral vein and including the common femoral, femoral, profunda femoral, popliteal and calf veins including the posterior tibial, peroneal and gastrocnemius veins when visible. The superficial great saphenous  vein was also interrogated. Spectral Doppler was utilized to evaluate flow at rest and with distal augmentation maneuvers in the common femoral, femoral and popliteal veins. COMPARISON:  None. FINDINGS: RIGHT LOWER EXTREMITY Common Femoral Vein: No evidence of thrombus. Normal compressibility, respiratory phasicity and response to augmentation. Saphenofemoral Junction: No evidence of thrombus. Normal compressibility and flow on color Doppler imaging. Profunda Femoral Vein: No evidence of thrombus. Normal compressibility and flow on color Doppler imaging. Femoral Vein: No evidence of thrombus. Normal compressibility, respiratory phasicity and response to augmentation. Popliteal Vein: Limited due to patient body habitus. Given this limitation, no evidence of thrombus. Normal compressibility, respiratory phasicity and response to augmentation. Calf Veins: Limited due to patient body habitus.  Given this limitation, no evidence of thrombus. Normal compressibility and flow on color Doppler imaging. Superficial Great Saphenous Vein: No evidence of thrombus. Normal compressibility. Venous Reflux:  None. Other Findings:  None. LEFT LOWER EXTREMITY Common Femoral Vein: No evidence of thrombus. Normal compressibility, respiratory phasicity and response to augmentation. Saphenofemoral Junction: No evidence of thrombus. Normal compressibility and flow on color Doppler imaging. Profunda Femoral Vein: No evidence of thrombus. Normal compressibility and flow on color Doppler imaging. Femoral Vein: No evidence of thrombus. Normal compressibility, respiratory phasicity and response to augmentation. Popliteal Vein: Limited due to patient body habitus. Given this limitation, no evidence of thrombus. Normal compressibility, respiratory phasicity and response to augmentation. Calf Veins: Limited due to patient body habitus. Given this limitation, no evidence of thrombus. Normal compressibility and flow on color Doppler imaging. Superficial Great Saphenous Vein: No evidence of thrombus. Normal compressibility. Venous Reflux:  None. Other Findings:  None. IMPRESSION: No evidence of deep venous thrombosis in either lower extremity. Electronically Signed   By: Romona Curlsyler  Litton M.D.   On: 01/21/2019 17:40   DG Chest Portable 1 View  Result Date: 01/21/2019 CLINICAL DATA:  Chest pain. EXAM: PORTABLE CHEST 1 VIEW COMPARISON:  10/23/2018 FINDINGS: Stable cardiac enlargement. No pleural effusion or edema. No airspace opacities identified. Osseous structures are unremarkable. IMPRESSION: No acute cardiopulmonary abnormalities. Electronically Signed   By: Signa Kellaylor  Stroud M.D.   On: 01/21/2019 13:13    Subjective: Patient was feeling better on the day of discharge.  He really wants to go home today.  Diuresing well on IV Lasix.  Agreed to get 1 more dose of IV Lasix before going home.  Discharge Exam: Vitals:   01/27/19 0914  01/27/19 0934  BP: 113/60 136/79  Pulse: 75 65  Resp: 18 18  Temp: 97.6 F (36.4 C) 97.7 F (36.5 C)  SpO2: 94% 100%   Vitals:   01/27/19 0339 01/27/19 0711 01/27/19 0914 01/27/19 0934  BP: (!) 106/53 (!) 98/43 113/60 136/79  Pulse: (!) 58 (!) 57 75 65  Resp: 16 19 18 18   Temp: 97.7 F (36.5 C)  97.6 F (36.4 C) 97.7 F (36.5 C)  TempSrc: Oral  Oral Oral  SpO2: 95% 100% 94% 100%  Weight: (!) 185.4 kg     Height:        General: Pt is alert, awake, not in acute distress Cardiovascular: RRR, S1/S2 +, no rubs, no gallops Respiratory: CTA bilaterally, no wheezing, no rhonchi Abdominal: Soft, NT, ND, bowel sounds + Extremities: 2+ lower extremity edema, Unna wrap in place.   The results of significant diagnostics from this hospitalization (including imaging, microbiology, ancillary and laboratory) are listed below for reference.     Microbiology: Recent Results (from the past 240 hour(s))  Aerobic Culture (superficial specimen)  Status: Abnormal   Collection Time: 01/18/19 10:52 AM   Specimen: Leg  Result Value Ref Range Status   Specimen Description LEG LEFT  Final   Special Requests NONE  Final   Gram Stain   Final    WBC PRESENT, PREDOMINANTLY MONONUCLEAR MODERATE GRAM NEGATIVE RODS MODERATE GRAM POSITIVE COCCI Performed at [facility], [address], [place], [place] [address]    Culture MULTIPLE ORGANISMS PRESENT, NONE PREDOMINANT (A)  Final   Report Status 01/21/2019 FINAL  Final  Urine culture     Status: Abnormal   Collection Time: 01/21/19 12:15 PM   Specimen: Urine, Random  Result Value Ref Range Status   Specimen Description   Final    URINE, RANDOM Performed at [facility], [address], [place], [place] [address]    Special Requests   Final    NONE Performed at [facility], [address], [place], [place] [address]    Culture (A)  Final    <10,000 COLONIES/mL INSIGNIFICANT GROWTH Performed at [facility], [address], [place], [place] [address]    Report Status 01/22/2019 FINAL  Final  Culture, blood (routine x 2)     Status: None   Collection Time: 01/21/19 12:20 PM   Specimen: BLOOD  Result Value Ref Range Status   Specimen Description BLOOD RIGHT ANTECUBITAL  Final   Special Requests   Final    BOTTLES DRAWN AEROBIC AND ANAEROBIC Blood Culture results may not be optimal due to an excessive volume of blood received in culture bottles   Culture   Final    NO GROWTH 5 DAYS Performed at [facility], [address], [place], [place] [address]    Report Status 01/26/2019 FINAL  Final  Culture, blood (routine x 2)     Status: None   Collection Time: 01/21/19 12:21 PM   Specimen: BLOOD  Result Value Ref Range Status   Specimen Description BLOOD LEFT ANTECUBITAL  Final   Special Requests   Final    BOTTLES DRAWN AEROBIC AND ANAEROBIC Blood Culture adequate volume   Culture   Final    NO GROWTH 5 DAYS Performed at [facility], [address] [place], [place] [address]    Report Status 01/26/2019 FINAL  Final  SARS CORONAVIRUS 2 (TAT 6-24 HRS) Nasopharyngeal Nasopharyngeal Swab     Status: None   Collection Time: 01/21/19  1:17 PM   Specimen: Nasopharyngeal Swab  Result Value Ref Range Status   SARS Coronavirus 2 NEGATIVE NEGATIVE Final    Comment: (NOTE) SARS-CoV-2 target nucleic acids are NOT DETECTED. The SARS-CoV-2 RNA is generally detectable in upper and lower respiratory specimens during the acute phase of infection. Negative results do not preclude SARS-CoV-2 infection, do not rule out co-infections with other pathogens, and should not be used as the sole basis for treatment or other patient management decisions. Negative results must be combined with clinical observations, patient history, and epidemiological information. The expected result is Negative. Fact Sheet for Patients: HairSlick.no Fact Sheet for  Healthcare Providers: quierodirigir.com This test is not yet approved or cleared by the [place] FDA and  has been authorized for detection and/or diagnosis of SARS-CoV-2 by FDA under an Emergency Use Authorization (EUA). This EUA will remain  in effect (meaning this test can be used) for the duration of the COVID-19 declaration under Section 56 4(b)(1) of the Act, 21 U.S.C. section 360bbb-3(b)(1), unless the authorization is terminated or  revoked sooner. Performed at [facility], [address] [place], [place] [address]      Labs: BNP (last 3 results) Recent Labs    07/17/18 2333 08/06/18 1320 10/23/18 1817  BNP 290.0* 173.0* 157.0*   Basic Metabolic Panel: Recent Labs  Lab 01/21/19 1220 01/23/19 1121 01/25/19 0417 01/26/19 0525 01/27/19 0402  NA 135 132* 134* 135 135  K 3.9 4.2 4.0 4.0 4.1  CL 99 92* 94* 96* 93*  CO2 24 28 29  34* 32  GLUCOSE 115* 149* 164* 125* 109*  BUN 17 13 21* 20 28*  CREATININE 1.14 1.05 1.15 1.10 1.16  CALCIUM 8.4* 8.7* 8.4* 8.5* 8.8*  MG  --  1.6* 1.8  --   --    Liver Function Tests: No results for input(s): AST, ALT, ALKPHOS, BILITOT, PROT, ALBUMIN in the last 168 hours. No results for input(s): LIPASE, AMYLASE in the last 168 hours. No results for input(s): AMMONIA in the last 168 hours. CBC: Recent Labs  Lab 01/21/19 1220 01/23/19 1121 01/24/19 0610 01/25/19 0417  WBC 7.2 5.8 6.4 6.3  NEUTROABS 5.3 4.3  --   --   HGB 10.9* 12.0* 10.2* 10.1*  HCT 33.5* 37.0* 31.3* 30.6*  MCV 92.5 93.4 92.6 89.2  PLT 321 295 253 247   Cardiac Enzymes: No results for input(s): CKTOTAL, CKMB, CKMBINDEX, TROPONINI in the last 168 hours. BNP: Invalid input(s): POCBNP CBG: Recent Labs  Lab 01/23/19 0732  GLUCAP 115*   D-Dimer No results for input(s): DDIMER in the last 72 hours. Hgb A1c No results for input(s): HGBA1C in the last 72 hours. Lipid Profile No results for input(s): CHOL, HDL, LDLCALC,  TRIG, CHOLHDL, LDLDIRECT in the last 72 hours. Thyroid function studies No results for input(s): TSH, T4TOTAL, T3FREE, THYROIDAB in the last 72 hours.  Invalid input(s): FREET3 Anemia work up No results for input(s): VITAMINB12, FOLATE, FERRITIN, TIBC, IRON, RETICCTPCT in the last 72 hours. Urinalysis    Component Value Date/Time   COLORURINE YELLOW (A) 01/21/2019 1221   APPEARANCEUR HAZY (A) 01/21/2019 1221   APPEARANCEUR Hazy (A) 12/25/2018 1442   LABSPEC 1.027 01/21/2019 1221   PHURINE 5.0 01/21/2019 1221   GLUCOSEU NEGATIVE 01/21/2019 1221   HGBUR NEGATIVE 01/21/2019 1221   BILIRUBINUR NEGATIVE 01/21/2019 1221   BILIRUBINUR Negative 12/25/2018 1442   KETONESUR NEGATIVE 01/21/2019 1221   PROTEINUR 30 (A) 01/21/2019 1221   NITRITE NEGATIVE 01/21/2019 1221   LEUKOCYTESUR TRACE (A) 01/21/2019 1221   Sepsis Labs Invalid input(s): PROCALCITONIN,  WBC,  LACTICIDVEN Microbiology Recent Results (from the past 240 hour(s))  Aerobic Culture (superficial specimen)     Status: Abnormal   Collection Time: 01/18/19 10:52 AM   Specimen: Leg  Result Value Ref Range Status   Specimen Description LEG LEFT  Final   Special Requests NONE  Final   Gram Stain   Final    WBC PRESENT, PREDOMINANTLY MONONUCLEAR MODERATE GRAM NEGATIVE RODS MODERATE GRAM POSITIVE COCCI Performed at [facility], [address] [place], [place] [address]    Culture MULTIPLE ORGANISMS PRESENT, NONE PREDOMINANT (A)  Final   Report Status 01/21/2019 FINAL  Final  Urine culture     Status: Abnormal   Collection Time: 01/21/19 12:15 PM   Specimen: Urine, Random  Result Value Ref Range Status   Specimen Description   Final    URINE, RANDOM Performed at [facility], [address] [place], [place] [address]    Special Requests  Final    NONE Performed at [facility], [address] [place], [place] [address]    Culture (A)  Final    <10,000 COLONIES/mL INSIGNIFICANT  GROWTH Performed at [facility], [address], [place], [place] [address]    Report Status 01/22/2019 FINAL  Final  Culture, blood (routine x 2)     Status: None   Collection Time: 01/21/19 12:20 PM   Specimen: BLOOD  Result Value Ref Range Status   Specimen Description BLOOD RIGHT ANTECUBITAL  Final   Special Requests   Final    BOTTLES DRAWN AEROBIC AND ANAEROBIC Blood Culture results may not be optimal due to an excessive volume of blood received in culture bottles   Culture   Final    NO GROWTH 5 DAYS Performed at [facility], [address], [place], [place] [address]    Report Status 01/26/2019 FINAL  Final  Culture, blood (routine x 2)     Status: None   Collection Time: 01/21/19 12:21 PM   Specimen: BLOOD  Result Value Ref Range Status   Specimen Description BLOOD LEFT ANTECUBITAL  Final   Special Requests   Final    BOTTLES DRAWN AEROBIC AND ANAEROBIC Blood Culture adequate volume   Culture   Final    NO GROWTH 5 DAYS Performed at [facility], [address] [place], [place] [address]    Report Status 01/26/2019 FINAL  Final  SARS CORONAVIRUS 2 (TAT 6-24 HRS) Nasopharyngeal Nasopharyngeal Swab     Status: None   Collection Time: 01/21/19  1:17 PM   Specimen: Nasopharyngeal Swab  Result Value Ref Range Status   SARS Coronavirus 2 NEGATIVE NEGATIVE Final    Comment: (NOTE) SARS-CoV-2 target nucleic acids are NOT DETECTED. The SARS-CoV-2 RNA is generally detectable in upper and lower respiratory specimens during the acute phase of infection. Negative results do not preclude SARS-CoV-2 infection, do not rule out co-infections with other pathogens, and should not be used as the sole basis for treatment or other patient management decisions. Negative results must be combined with clinical observations, patient history, and epidemiological information. The expected result is Negative. Fact Sheet for  Patients: HairSlick.no Fact Sheet for Healthcare Providers: quierodirigir.com This test is not yet approved or cleared by the [place] FDA and  has been authorized for detection and/or diagnosis of SARS-CoV-2 by FDA under an Emergency Use Authorization (EUA). This EUA will remain  in effect (meaning this test can be used) for the duration of the COVID-19 declaration under Section 56 4(b)(1) of the Act, 21 U.S.C. section 360bbb-3(b)(1), unless the authorization is terminated or revoked sooner. Performed at [facility], [address] [place], [place] [address]     Time coordinating discharge: Over 30 minutes  SIGNED:  Arnetha Courser, MD  [facility] 01/27/2019, 9:50 AM Pager [phone]  If 7PM-7AM, please contact night-coverage [email or web address] Password [email or web address]  This record has been created using Conservation officer, historic buildings. Errors have been sought and corrected,but may not always be located. Such creation errors do not reflect on the standard of care.

## 2019-01-27 NOTE — Plan of Care (Signed)
  Problem: Activity: Goal: Ability to tolerate increased activity will improve Outcome: Progressing   Problem: Cardiac: Goal: Ability to achieve and maintain adequate cardiovascular perfusion will improve Outcome: Progressing   

## 2019-01-27 NOTE — Progress Notes (Signed)
    Pt discharged home today  WIll make sure he has follow up in clinic   Dorris Carnes MD

## 2019-01-27 NOTE — Progress Notes (Signed)
[name] [name] to be D/C'd Home per MD order.  Discussed prescriptions and follow up appointments with the patient. Prescriptions given to patient, medication list explained in detail. Pt verbalized understanding.  Allergies as of 01/27/2019   No Known Allergies     Medication List    STOP taking these medications   sulfamethoxazole-trimethoprim 800-160 MG tablet Commonly known as: BACTRIM DS     TAKE these medications   albuterol 108 (90 Base) MCG/ACT inhaler Commonly known as: VENTOLIN HFA Inhale 2 puffs into the lungs every 6 (six) hours as needed for wheezing or shortness of breath.   allopurinol 100 MG tablet Commonly known as: ZYLOPRIM TAKE 1 TABLET BY MOUTH ONCE DAILY   amiodarone 200 MG tablet Commonly known as: PACERONE Take 1 tablet (200 mg total) by mouth daily.   atorvastatin 40 MG tablet Commonly known as: LIPITOR TAKE 1 TABLET BY MOUTH ONCE DAILY   clindamycin 300 MG capsule Commonly known as: CLEOCIN Take 1 capsule (300 mg total) by mouth every 6 (six) hours for 4 days.   DULoxetine 20 MG capsule Commonly known as: CYMBALTA Take 20 mg by mouth daily.   ferrous sulfate 325 (65 FE) MG tablet Take 1 tablet (325 mg total) by mouth 2 (two) times daily with a meal.   hydrocortisone 25 MG suppository Commonly known as: ANUSOL-HC Place 1 suppository (25 mg total) rectally 2 (two) times daily.   levothyroxine 50 MCG tablet Commonly known as: SYNTHROID Take 1 tablet (50 mcg total) by mouth daily before breakfast.   lisinopril 2.5 MG tablet Commonly known as: ZESTRIL TAKE 1 TABLET BY MOUTH ONCE DAILY   meloxicam 15 MG tablet Commonly known as: MOBIC Take 1 tablet (15 mg total) by mouth daily.   metoprolol succinate 50 MG 24 hr tablet Commonly known as: TOPROL-XL Take 50 mg by mouth 2 (two) times daily. Take with or immediately following a meal.   nitroGLYCERIN 0.4 MG SL tablet Commonly known as: NITROSTAT Place 1 tablet (0.4 mg total) under the  tongue every 5 (five) minutes as needed for chest pain.   nystatin-triamcinolone ointment Commonly known as: MYCOLOG Apply 1 application topically 2 (two) times daily.   spironolactone 25 MG tablet Commonly known as: ALDACTONE TAKE 1 TABLET BY MOUTH ONCE DAILY   torsemide 20 MG tablet Commonly known as: DEMADEX Take 3 tablets (60 mg total) by mouth 2 (two) times daily. What changed: how much to take   traZODone 150 MG tablet Commonly known as: DESYREL TAKE 1 TABLET BY MOUTH AT BEDTIME AS NEEDED SLEEP What changed: See the new instructions. Notes to patient: Take as needed   warfarin 5 MG tablet Commonly known as: Coumadin Take 3 tablets (15 mg total) by mouth daily. What changed: how much to take       Vitals:   01/27/19 0914 01/27/19 0934  BP: 113/60 136/79  Pulse: 75 65  Resp: 18 18  Temp: 97.6 F (36.4 C) 97.7 F (36.5 C)  SpO2: 94% 100%    Tele box removed and returned.Skin clean, dry and intact without evidence of skin break down, no evidence of skin tears noted. IV catheter discontinued intact. Site without signs and symptoms of complications. Dressing and pressure applied. Pt denies pain at this time. No complaints noted.  An After Visit Summary was printed and given to the patient. Patient escorted via [place], and D/C home via private auto.  Rolley Sims

## 2019-01-27 NOTE — TOC Transition Note (Signed)
Transition of Care [facility]) - CM/SW Discharge Note   Patient Details  Name: Coulter Oldaker MRN: [id number] Date of Birth: 04-22-1960  Transition of Care [facility]) CM/SW Contact:  Marshell Garfinkel, RN Phone Number: 01/27/2019, 9:56 AM   Clinical Narrative:     Resumption of care with [facility] at home- Helene Kelp with [facility] aware. He has purchased wheelchair less than 5 years and therefore his Medicaid will not cover a new rollator- advised him to check with Hospice store. I cannot assist him with medication because he has Medicaid. He will borrow money for family to pay for antibiotic.His sister in law will provide transportation to home today.   Final next level of care: [place] Barriers to Discharge: Continued Medical Work up   Patient Goals and CMS Choice Patient states their goals for this hospitalization and ongoing recovery are:: "I need a new rollator. Mine is old and broken but Medicaid won't pay for me to get a new one." CMS Medicare.gov Compare Post Acute Care list provided to:: Patient Choice offered to / list presented to : NA  Discharge Placement                       Discharge Plan and Services     Post Acute Care Choice: Resumption of Svcs/PTA Provider                    HH Arranged: RN, PT, Nurse's Aide, Social Work [employer] Agency: [facility] [employer] [facility] [employer] Date [place]: 01/23/19   Representative spoke with at [place]: [place] (SDOH) Interventions     Readmission Risk Interventions Readmission Risk Prevention Plan 01/25/2019 10/25/2018 08/08/2018  Transportation Screening Complete Complete Complete  PCP or Specialist Appt within 3-5 Days - Complete Complete  HRI or [place] - Complete Complete  Social Work Consult for [place] Planning/Counseling - Complete Patient refused  Palliative Care Screening - Not Applicable Not Applicable  Medication Review Human resources officer) Complete Complete Complete  PCP or Specialist appointment within 3-5 days of discharge Complete - -  [place] or Home Care Consult Complete - -  SW Recovery Care/Counseling Consult Complete - -  Palliative Care Screening Not Applicable - -  Between Not Applicable - -

## 2019-01-28 DIAGNOSIS — S069X9S Unspecified intracranial injury with loss of consciousness of unspecified duration, sequela: Secondary | ICD-10-CM | POA: Diagnosis not present

## 2019-01-29 DIAGNOSIS — S069X9S Unspecified intracranial injury with loss of consciousness of unspecified duration, sequela: Secondary | ICD-10-CM | POA: Diagnosis not present

## 2019-01-30 ENCOUNTER — Ambulatory Visit: Payer: Medicaid Other | Admitting: Adult Health

## 2019-01-30 DIAGNOSIS — S069X9S Unspecified intracranial injury with loss of consciousness of unspecified duration, sequela: Secondary | ICD-10-CM | POA: Diagnosis not present

## 2019-01-30 DIAGNOSIS — I872 Venous insufficiency (chronic) (peripheral): Secondary | ICD-10-CM | POA: Diagnosis not present

## 2019-01-31 ENCOUNTER — Encounter (INDEPENDENT_AMBULATORY_CARE_PROVIDER_SITE_OTHER): Payer: Medicaid Other

## 2019-01-31 DIAGNOSIS — S069X9S Unspecified intracranial injury with loss of consciousness of unspecified duration, sequela: Secondary | ICD-10-CM | POA: Diagnosis not present

## 2019-02-01 DIAGNOSIS — S069X9S Unspecified intracranial injury with loss of consciousness of unspecified duration, sequela: Secondary | ICD-10-CM | POA: Diagnosis not present

## 2019-02-02 ENCOUNTER — Other Ambulatory Visit: Payer: Self-pay

## 2019-02-02 ENCOUNTER — Encounter: Payer: Medicaid Other | Admitting: Physician Assistant

## 2019-02-02 DIAGNOSIS — S069X9S Unspecified intracranial injury with loss of consciousness of unspecified duration, sequela: Secondary | ICD-10-CM | POA: Diagnosis not present

## 2019-02-02 DIAGNOSIS — J449 Chronic obstructive pulmonary disease, unspecified: Secondary | ICD-10-CM | POA: Diagnosis not present

## 2019-02-02 DIAGNOSIS — G4730 Sleep apnea, unspecified: Secondary | ICD-10-CM | POA: Diagnosis not present

## 2019-02-02 DIAGNOSIS — Z6841 Body Mass Index (BMI) 40.0 and over, adult: Secondary | ICD-10-CM | POA: Diagnosis not present

## 2019-02-02 DIAGNOSIS — Z8249 Family history of ischemic heart disease and other diseases of the circulatory system: Secondary | ICD-10-CM | POA: Diagnosis not present

## 2019-02-02 DIAGNOSIS — I480 Paroxysmal atrial fibrillation: Secondary | ICD-10-CM | POA: Diagnosis not present

## 2019-02-02 DIAGNOSIS — J9610 Chronic respiratory failure, unspecified whether with hypoxia or hypercapnia: Secondary | ICD-10-CM | POA: Diagnosis not present

## 2019-02-02 DIAGNOSIS — Z7901 Long term (current) use of anticoagulants: Secondary | ICD-10-CM | POA: Diagnosis not present

## 2019-02-02 DIAGNOSIS — E11622 Type 2 diabetes mellitus with other skin ulcer: Secondary | ICD-10-CM | POA: Diagnosis not present

## 2019-02-02 DIAGNOSIS — I5042 Chronic combined systolic (congestive) and diastolic (congestive) heart failure: Secondary | ICD-10-CM | POA: Diagnosis not present

## 2019-02-02 DIAGNOSIS — L03116 Cellulitis of left lower limb: Secondary | ICD-10-CM | POA: Diagnosis not present

## 2019-02-02 DIAGNOSIS — L97812 Non-pressure chronic ulcer of other part of right lower leg with fat layer exposed: Secondary | ICD-10-CM | POA: Diagnosis not present

## 2019-02-02 DIAGNOSIS — L97822 Non-pressure chronic ulcer of other part of left lower leg with fat layer exposed: Secondary | ICD-10-CM | POA: Diagnosis not present

## 2019-02-02 DIAGNOSIS — E669 Obesity, unspecified: Secondary | ICD-10-CM | POA: Diagnosis not present

## 2019-02-02 DIAGNOSIS — L03115 Cellulitis of right lower limb: Secondary | ICD-10-CM | POA: Diagnosis not present

## 2019-02-02 DIAGNOSIS — I878 Other specified disorders of veins: Secondary | ICD-10-CM | POA: Diagnosis not present

## 2019-02-02 DIAGNOSIS — I890 Lymphedema, not elsewhere classified: Secondary | ICD-10-CM | POA: Diagnosis not present

## 2019-02-02 DIAGNOSIS — I872 Venous insufficiency (chronic) (peripheral): Secondary | ICD-10-CM | POA: Diagnosis not present

## 2019-02-02 DIAGNOSIS — I110 Hypertensive heart disease with heart failure: Secondary | ICD-10-CM | POA: Diagnosis not present

## 2019-02-02 DIAGNOSIS — Z86711 Personal history of pulmonary embolism: Secondary | ICD-10-CM | POA: Diagnosis not present

## 2019-02-02 NOTE — Progress Notes (Signed)
[name] [name], [name] [name] ([id number]) Visit Report for 02/02/2019 Arrival Information Details Patient Name: [name] [name], [name] [name] Date of Service: 02/02/2019 1:45 PM Medical Record Number: [id number] Patient Account Number: [id number] Date of Birth/Sex: [birth year] (58 y.o. M) Treating RN: Arnette Norris Primary Care Que Meneely: Olevia Perches Other Clinician: Referring Gerado Nabers: Olevia Perches Treating Eleaner Dibartolo/Extender: Linwood Dibbles, HOYT Weeks in Treatment: 2 Visit Information History Since Last Visit Added or deleted any medications: No Patient Arrived: [place] Any new allergies or adverse reactions: No Arrival Time: 13:19 Had a fall or experienced change in No Accompanied By: self activities of daily living that may affect Transfer Assistance: None risk of falls: Patient Identification Verified: Yes Signs or symptoms of abuse/neglect since last visito No Secondary Verification Process Yes Hospitalized since last visit: No Completed: Pain Present Now: Yes Patient Has Alerts: Yes Patient Alerts: Patient on Blood Thinner Warfarin Electronic Signature(s) Signed: 02/02/2019 2:21:28 PM By: Arnette Norris Entered By: Arnette Norris on 02/02/2019 13:23:18 [name] [name] ([id number]) -------------------------------------------------------------------------------- Encounter Discharge Information Details Patient Name: [name] [name] Date of Service: 02/02/2019 1:45 PM Medical Record Number: [id number] Patient Account Number: [id number] Date of Birth/Sex: 04/03/1960 (58 y.o. M) Treating RN: Curtis Sites Primary Care Shewanda Sharpe: Olevia Perches Other Clinician: Referring Leyana Whidden: Olevia Perches Treating Ingri Diemer/Extender: Linwood Dibbles, HOYT Weeks in Treatment: 2 Encounter Discharge Information Items Post Procedure Vitals Discharge Condition: Stable Temperature (F): 98.8 Ambulatory Status: [place] Pulse (bpm): 60 Discharge Destination: Home Respiratory Rate (breaths/min): 20 Transportation:  [employer] Blood Pressure (mmHg): 142/66 Accompanied By: self Schedule Follow-up Appointment: Yes Clinical Summary of Care: Electronic Signature(s) Signed: 02/02/2019 3:43:45 PM By: Curtis Sites Entered By: Curtis Sites on 02/02/2019 14:18:45 [name] [name] ([id number]) -------------------------------------------------------------------------------- Lower Extremity Assessment Details Patient Name: [name] [name] Date of Service: 02/02/2019 1:45 PM Medical Record Number: [id number] Patient Account Number: [id number] Date of Birth/Sex: 08/31/1960 (58 y.o. M) Treating RN: Arnette Norris Primary Care Rainbow Salman: Olevia Perches Other Clinician: Referring Anja Neuzil: Olevia Perches Treating Joash Tony/Extender: Linwood Dibbles, HOYT Weeks in Treatment: 2 Edema Assessment Assessed: [Left: No] [Right: No] [Left: Edema] [Right: :] Calf Left: Right: Point of Measurement: 34 cm From Medial Instep 57 cm 62.5 cm Ankle Left: Right: Point of Measurement: 14 cm From Medial Instep 38 cm 36.5 cm Vascular Assessment Pulses: Dorsalis Pedis Palpable: [Left:Yes] [Right:Yes] Posterior Tibial Palpable: [Left:Yes] [Right:Yes] Electronic Signature(s) Signed: 02/02/2019 2:21:28 PM By: Arnette Norris Entered By: Arnette Norris on 02/02/2019 13:40:20 [name] [name] ([id number]) -------------------------------------------------------------------------------- Multi Wound Chart Details Patient Name: [name] [name] Date of Service: 02/02/2019 1:45 PM Medical Record Number: [id number] Patient Account Number: [id number] Date of Birth/Sex: 1960-12-08 (58 y.o. M) Treating RN: Curtis Sites Primary Care Airik Goodlin: Olevia Perches Other Clinician: Referring Ramond Darnell: Olevia Perches Treating Ethie Curless/Extender: Linwood Dibbles, HOYT Weeks in Treatment: 2 Vital Signs Height(in): 68 Pulse(bpm): 60 Weight(lbs): 400 Blood Pressure(mmHg): 142/66 Body Mass Index(BMI): 61 Temperature(F): 98.8 Respiratory  Rate 20 (breaths/min): Photos: [N/A:N/A] Wound Location: Right Lower Leg - Lateral Left Lower Leg - Lateral N/A Wounding Event: Gradually Appeared Gradually Appeared N/A Primary Etiology: Venous Leg Ulcer Venous Leg Ulcer N/A Secondary Etiology: Diabetic Wound/Ulcer of the Diabetic Wound/Ulcer of the N/A Lower Extremity Lower Extremity Comorbid History: Asthma, Chronic Obstructive Asthma, Chronic Obstructive N/A Pulmonary Disease (COPD), Pulmonary Disease (COPD), Sleep Apnea, Arrhythmia, Sleep Apnea, Arrhythmia, Congestive Heart Failure, Congestive Heart Failure, Hypertension, Type II Diabetes Hypertension, Type II Diabetes Date Acquired: 12/24/2018 12/24/2018 N/A Weeks of Treatment: 2 2 N/A Wound Status: Open Open N/A Measurements L x W x D 5.5x12x0.1 9x18x0.1 N/A (cm) Area (cm) : 51.836 127.235 N/A Volume (  cm) : 5.184 12.723 N/A % Reduction in Area: 36.50% 53.00% N/A % Reduction in Volume: 36.50% 53.00% N/A Classification: Full Thickness Without Full Thickness Without N/A Exposed Support Structures Exposed Support Structures Exudate Amount: Large Large N/A Exudate Type: Serous Serous N/A Exudate Color: amber amber N/A Wound Margin: Indistinct, nonvisible Indistinct, nonvisible N/A Granulation Amount: Medium (34-66%) Small (1-33%) N/A Granulation Quality: Pink Pink N/A Necrotic Amount: Medium (34-66%) Large (67-100%) N/A Exposed Structures: N/A DASHTON, [name] [name] ([id number]) Fat Layer (Subcutaneous Fat Layer (Subcutaneous Tissue) Exposed: Yes Tissue) Exposed: Yes Fascia: No Fascia: No Tendon: No Tendon: No Muscle: No Muscle: No Joint: No Joint: No Bone: No Bone: No Epithelialization: None None N/A Treatment Notes Electronic Signature(s) Signed: 02/02/2019 3:43:45 PM By: Montey Hora Entered By: Montey Hora on 02/02/2019 14:10:33 [name] [name] ([id number]) -------------------------------------------------------------------------------- Multi-Disciplinary  Care Plan Details Patient Name: [name] [name] Date of Service: 02/02/2019 1:45 PM Medical Record Number: [id number] Patient Account Number: [id number] Date of Birth/Sex: 12/11/1960 (58 y.o. M) Treating RN: Montey Hora Primary Care Marris Frontera: Park Liter Other Clinician: Referring Lenord Fralix: Park Liter Treating Keymora Grillot/Extender: Melburn Hake, HOYT Weeks in Treatment: 2 Active Inactive Nutrition Nursing Diagnoses: Imbalanced nutrition Goals: Patient/caregiver verbalizes understanding of need to maintain therapeutic glucose control per primary care physician Date Initiated: 01/18/2019 Target Resolution Date: 02/16/2019 Goal Status: Active Interventions: Assess patient nutrition upon admission and as needed per policy Notes: Orientation to the Wound Care Program Nursing Diagnoses: Knowledge deficit related to the wound healing center program Goals: Patient/caregiver will verbalize understanding of the [place] Program Date Initiated: 01/18/2019 Target Resolution Date: 02/23/2019 Goal Status: Active Interventions: Provide education on orientation to the wound center Notes: Pain, Acute or Chronic Nursing Diagnoses: Pain, acute or chronic: actual or potential Goals: Patient/caregiver will verbalize adequate pain control between visits Date Initiated: 01/18/2019 Target Resolution Date: 02/16/2019 Goal Status: Active Interventions: Assess comfort goal upon admission [name] [name], [name] [name] ([id number]) Notes: Wound/Skin Impairment Nursing Diagnoses: Impaired tissue integrity Goals: Ulcer/skin breakdown will have a volume reduction of 30% by week 4 Date Initiated: 01/18/2019 Target Resolution Date: 02/16/2019 Goal Status: Active Interventions: Assess ulceration(s) every visit Notes: Electronic Signature(s) Signed: 02/02/2019 3:43:45 PM By: Montey Hora Entered By: Montey Hora on 02/02/2019 14:10:18 [name] [name]  ([id number]) -------------------------------------------------------------------------------- Pain Assessment Details Patient Name: [name] [name] Date of Service: 02/02/2019 1:45 PM Medical Record Number: [id number] Patient Account Number: [id number] Date of Birth/Sex: [birth year] (58 y.o. M) Treating RN: Harold Barban Primary Care Ransome Helwig: Park Liter Other Clinician: Referring Osie Amparo: Park Liter Treating Darchelle Nunes/Extender: Melburn Hake, HOYT Weeks in Treatment: 2 Active Problems Location of Pain Severity and Description of Pain Patient Has Paino Yes Site Locations Rate the pain. Current Pain Level: 9 Pain Management and Medication Current Pain Management: Electronic Signature(s) Signed: 02/02/2019 2:21:28 PM By: Harold Barban Entered By: Harold Barban on 02/02/2019 13:23:32 [name] [name] ([id number]) -------------------------------------------------------------------------------- Patient/Caregiver Education Details Patient Name: [name] [name] Date of Service: 02/02/2019 1:45 PM Medical Record Number: [id number] Patient Account Number: [id number] Date of Birth/Gender: [birth year] (58 y.o. M) Treating RN: Montey Hora Primary Care Physician: Park Liter Other Clinician: Referring Physician: Park Liter Treating Physician/Extender: Sharalyn Ink in Treatment: 2 Education Assessment Education Provided To: Patient Education Topics Provided Venous: Handouts: Other: leg elevation Methods: Explain/Verbal Responses: State content correctly Electronic Signature(s) Signed: 02/02/2019 3:43:45 PM By: Montey Hora Entered By: Montey Hora on 02/02/2019 14:15:49 [name] [name] ([id number]) -------------------------------------------------------------------------------- Wound Assessment Details Patient Name: [name] [name] Date of Service: 02/02/2019 1:45 PM Medical Record Number: [id number] Patient Account Number: [id number] Date of  Birth/Sex: 29-Oct-1960 (58 y.o. M) Treating RN: Arnette Norris Primary Care Lori-Ann Lindfors: Olevia Perches Other Clinician: Referring Muriel Hannold: Olevia Perches Treating Ezra Marquess/Extender: Linwood Dibbles, HOYT Weeks in Treatment: 2 Wound Status Wound Number: 1 Primary Venous Leg Ulcer Etiology: Wound Location: Right Lower Leg - Lateral Secondary Diabetic Wound/Ulcer of the Lower Extremity Wounding Event: Gradually Appeared Etiology: Date Acquired: 12/24/2018 Wound Open Weeks Of Treatment: 2 Status: Clustered Wound: No Comorbid Asthma, Chronic Obstructive Pulmonary History: Disease (COPD), Sleep Apnea, Arrhythmia, Congestive Heart Failure, Hypertension, Type II Diabetes Photos Wound Measurements Length: (cm) 5.5 Width: (cm) 12 Depth: (cm) 0.1 Area: (cm) 51.836 Volume: (cm) 5.184 % Reduction in Area: 36.5% % Reduction in Volume: 36.5% Epithelialization: None Tunneling: No Undermining: No Wound Description Full Thickness Without Exposed Support Foul Odo Classification: Structures Slough/F Wound Margin: Indistinct, nonvisible Exudate Large Amount: Exudate Type: Serous Exudate Color: amber r After Cleansing: No ibrino Yes Wound Bed Granulation Amount: Medium (34-66%) Exposed Structure Granulation Quality: Pink Fascia Exposed: No Necrotic Amount: Medium (34-66%) Fat Layer (Subcutaneous Tissue) Exposed: Yes Necrotic Quality: Adherent Slough Tendon Exposed: No Muscle Exposed: No [name] [name], [name] [name] ([id number]) Joint Exposed: No Bone Exposed: No Treatment Notes Wound #1 (Right, Lateral Lower Leg) Notes silvercel, xtrasorb, unna boot bilateral Electronic Signature(s) Signed: 02/02/2019 2:21:28 PM By: Arnette Norris Entered By: Arnette Norris on 02/02/2019 13:37:48 [name] [name] ([id number]) -------------------------------------------------------------------------------- Wound Assessment Details Patient Name: [name] [name] Date of Service: 02/02/2019 1:45 PM Medical  Record Number: [id number] Patient Account Number: [id number] Date of Birth/Sex: 07-Nov-1960 (58 y.o. M) Treating RN: Arnette Norris Primary Care Bree Heinzelman: Olevia Perches Other Clinician: Referring Adlene Adduci: Olevia Perches Treating Ronae Noell/Extender: Linwood Dibbles, HOYT Weeks in Treatment: 2 Wound Status Wound Number: 2 Primary Venous Leg Ulcer Etiology: Wound Location: Left Lower Leg - Lateral Secondary Diabetic Wound/Ulcer of the Lower Extremity Wounding Event: Gradually Appeared Etiology: Date Acquired: 12/24/2018 Wound Open Weeks Of Treatment: 2 Status: Clustered Wound: No Comorbid Asthma, Chronic Obstructive Pulmonary History: Disease (COPD), Sleep Apnea, Arrhythmia, Congestive Heart Failure, Hypertension, Type II Diabetes Photos Wound Measurements Length: (cm) 9 Width: (cm) 18 Depth: (cm) 0.1 Area: (cm) 127.235 Volume: (cm) 12.723 % Reduction in Area: 53% % Reduction in Volume: 53% Epithelialization: None Tunneling: No Undermining: No Wound Description Full Thickness Without Exposed Support Foul Odo Classification: Structures Slough/F Wound Margin: Indistinct, nonvisible Exudate Large Amount: Exudate Type: Serous Exudate Color: amber r After Cleansing: No ibrino Yes Wound Bed Granulation Amount: Small (1-33%) Exposed Structure Granulation Quality: Pink Fascia Exposed: No Necrotic Amount: Large (67-100%) Fat Layer (Subcutaneous Tissue) Exposed: Yes Necrotic Quality: Adherent Slough Tendon Exposed: No Muscle Exposed: No Heggie, [name] [name] ([id number]) Joint Exposed: No Bone Exposed: No Treatment Notes Wound #2 (Left, Lateral Lower Leg) Notes silvercel, xtrasorb, unna boot bilateral Electronic Signature(s) Signed: 02/02/2019 2:21:28 PM By: Arnette Norris Entered By: Arnette Norris on 02/02/2019 13:38:10 [name] [name] ([id number]) -------------------------------------------------------------------------------- Vitals Details Patient Name: [name] [name] Date of Service: 02/02/2019 1:45 PM Medical Record Number: [id number] Patient Account Number: [id number] Date of Birth/Sex: 04/17/1960 (58 y.o. M) Treating RN: Arnette Norris Primary Care Venus Gilles: Olevia Perches Other Clinician: Referring Alecia Doi: Olevia Perches Treating Kailoni Vahle/Extender: Linwood Dibbles, HOYT Weeks in Treatment: 2 Vital Signs Time Taken: 13:23 Temperature (F): 98.8 Height (in): 68 Pulse (bpm): 60 Weight (lbs): 400 Respiratory Rate (breaths/min): 20 Body Mass Index (BMI): 60.8 Blood Pressure (mmHg): 142/66 Reference Range: 80 - 120 mg / dl Electronic Signature(s) Signed: 02/02/2019 2:21:28 PM By: Arnette Norris Entered By: Arnette Norris on 02/02/2019 13:25:16

## 2019-02-02 NOTE — Progress Notes (Addendum)
[name], [name] ([id number]) Visit Report for 02/02/2019 Chief Complaint Document Details Patient Name: [name] [name], [name] [name] Date of Service: 02/02/2019 1:45 PM Medical Record Number: [id number] Patient Account Number: [id number] Date of Birth/Sex: 01-20-1961 (58 y.o. M) Treating RN: Curtis Sites Primary Care Provider: Olevia Perches Other Clinician: Referring Provider: Olevia Perches Treating Provider/Extender: Linwood Dibbles, Estevan Kersh Weeks in Treatment: 2 Information Obtained from: Patient Chief Complaint Bilateral LE ulcers Electronic Signature(s) Signed: 02/02/2019 1:38:25 PM By: Lenda Kelp PA-C Entered By: Lenda Kelp on 02/02/2019 13:38:25 SEDRICK, VICTORINO ([id number]) -------------------------------------------------------------------------------- Debridement Details Patient Name: [name] [name] Date of Service: 02/02/2019 1:45 PM Medical Record Number: [id number] Patient Account Number: [id number] Date of Birth/Sex: 10-Oct-1960 (58 y.o. M) Treating RN: Curtis Sites Primary Care Provider: Olevia Perches Other Clinician: Referring Provider: Olevia Perches Treating Provider/Extender: Linwood Dibbles, Lorilee Cafarella Weeks in Treatment: 2 Debridement Performed for Wound #1 Right,Lateral Lower Leg Assessment: Performed By: Physician STONE III, Ladana Chavero E., PA-C Debridement Type: Debridement Severity of Tissue Pre Fat layer exposed Debridement: Level of Consciousness (Pre- Awake and Alert procedure): Pre-procedure Verification/Time Yes - 14:10 Out Taken: Start Time: 14:10 Pain Control: Lidocaine 4% Topical Solution Total Area Debrided (L x W): 5.5 (cm) x 12 (cm) = 66 (cm) Tissue and other material Viable, Non-Viable, Slough, Subcutaneous, Slough debrided: Level: Skin/Subcutaneous Tissue Debridement Description: Excisional Instrument: Curette Bleeding: Minimum Hemostasis Achieved: Pressure End Time: 14:14 Procedural Pain: 0 Post Procedural Pain: 0 Response to Treatment: Procedure  was tolerated well Level of Consciousness Awake and Alert (Post-procedure): Post Debridement Measurements of Total Wound Length: (cm) 5.5 Width: (cm) 12 Depth: (cm) 0.2 Volume: (cm) 10.367 Character of Wound/Ulcer Post Debridement: Improved Severity of Tissue Post Debridement: Fat layer exposed Post Procedure Diagnosis Same as Pre-procedure Electronic Signature(s) Signed: 02/02/2019 3:43:45 PM By: Curtis Sites Signed: 02/02/2019 4:46:07 PM By: Lenda Kelp PA-C Entered By: Curtis Sites on 02/02/2019 14:14:40 [name] [name] ([id number]) -------------------------------------------------------------------------------- HPI Details Patient Name: [name] [name] Date of Service: 02/02/2019 1:45 PM Medical Record Number: [id number] Patient Account Number: [id number] Date of Birth/Sex: 1961-02-05 (58 y.o. M) Treating RN: Curtis Sites Primary Care Provider: Olevia Perches Other Clinician: Referring Provider: Olevia Perches Treating Provider/Extender: Linwood Dibbles, Thinh Cuccaro Weeks in Treatment: 2 History of Present Illness HPI Description: 01/18/2019 on evaluation today patient presents for initial evaluation here in our clinic concerning issues he is been having with his bilateral lower extremities. He appears based on what I am seeing to have bilateral lower extremity cellulitis along with chronic venous stasis. He has significant pitting edema even in the bottom of his foot. With that being said he also has a history of hypertension, COPD, congestive heart failure, long-term use of anticoagulants due to atrial fibrillation which is paroxysmal as well as having had a pulmonary embolism in the past. With that being said he tells me that he has constant drainage out of his left lower extremity. This is likely due to the fact that again he has significant swelling pretty much all the time. With that being said in the past 2 weeks this has been getting worse and he tells me at this point  that he feels like it is infected is also hurting a lot more than it has been in the past and the drainage is a different color. The left side is definitely worse than the right. Fortunately there is no signs of active infection systemically although I am concerned due to the severity of the infection that I see in the left lower extremity especially. There is a lot  of necrotic tissue on the surface of both wounds that at some point is going to need to be cleaned off to allow these to heal but right now obviously this is much too tender for him. No fevers, chills, nausea, vomiting, or diarrhea. 02/02/2019 on evaluation today patient presents for follow-up concerning the wounds over his bilateral lower extremities. He is continuing to manage. He was in the hospital and has been discharged at this point. He was admitted from 01/21/2019 through 01/27/2019. This was due to cellulitis of lower extremity. Nonetheless he does seem to be doing much better at this point. I did review his discharge summary as well he did not appear to have any positive findings on blood cultures. His white blood cell count was never really elevated either which is also good news. He was treated with IV clindamycin for 4 days and then switch to complete a 10-day course at home which she is still working on at this point. They did have him on IV Lasix in the hospital which helped him diurese well on discharge they stated that his weight was 408 pounds which is still higher than his dry weight of 375 to 380 pounds. Upon discharge his torsemide was increased from 40 to 60 mg twice daily and he is to follow-up with cardiology as well. Electronic Signature(s) Signed: 02/02/2019 4:07:48 PM By: Lenda Kelp PA-C Entered By: Lenda Kelp on 02/02/2019 16:07:48 SATHVIK, TIEDT ([id number]) -------------------------------------------------------------------------------- Physical Exam Details Patient Name: [name] [name] Date  of Service: 02/02/2019 1:45 PM Medical Record Number: [id number] Patient Account Number: [id number] Date of Birth/Sex: 1961/01/25 (58 y.o. M) Treating RN: Curtis Sites Primary Care Provider: Olevia Perches Other Clinician: Referring Provider: Olevia Perches Treating Provider/Extender: Linwood Dibbles, Buell Parcel Weeks in Treatment: 2 Constitutional Obese and well-hydrated in no acute distress. Respiratory normal breathing without difficulty. clear to auscultation bilaterally. Cardiovascular regular rate and rhythm with normal S1, S2. Psychiatric this patient is able to make decisions and demonstrates good insight into disease process. Alert and Oriented x 3. pleasant and cooperative. Notes Upon inspection today patient did have slough noted on both lower extremities the right was worse than the left. This is good to require some sharp debridement to help clear away some of the necrotic debris which was performed today without complication he did not have any significant pain. On the left I did not perform sharp debridement as this appears to be much less loose and much more adhered to the base of the wound which I think is good to be more problematic for him as well. Electronic Signature(s) Signed: 02/02/2019 4:08:20 PM By: Lenda Kelp PA-C Entered By: Lenda Kelp on 02/02/2019 16:08:20 [name] [name] ([id number]) -------------------------------------------------------------------------------- Physician Orders Details Patient Name: [name] [name] Date of Service: 02/02/2019 1:45 PM Medical Record Number: [id number] Patient Account Number: [id number] Date of Birth/Sex: Aug 08, 1960 (58 y.o. M) Treating RN: Curtis Sites Primary Care Provider: Olevia Perches Other Clinician: Referring Provider: Olevia Perches Treating Provider/Extender: Linwood Dibbles, Kaylani Fromme Weeks in Treatment: 2 Verbal / Phone Orders: No Diagnosis Coding ICD-10 Coding Code Description I87.2 Venous insufficiency  (chronic) (peripheral) L97.822 Non-pressure chronic ulcer of other part of left lower leg with fat layer exposed L97.812 Non-pressure chronic ulcer of other part of right lower leg with fat layer exposed I10 Essential (primary) hypertension J44.9 Chronic obstructive pulmonary disease, unspecified Z79.01 Long term (current) use of anticoagulants I48.0 Paroxysmal atrial fibrillation Z86.711 Personal history of pulmonary embolism I50.42 Chronic combined systolic (congestive) and diastolic (congestive) heart  failure Wound Cleansing Wound #1 Right,Lateral Lower Leg o Clean wound with Normal Saline. o Cleanse wound with mild soap and water Wound #2 Left,Lateral Lower Leg o Clean wound with Normal Saline. o Cleanse wound with mild soap and water Primary Wound Dressing Wound #1 Right,Lateral Lower Leg o Silver Alginate Wound #2 Left,Lateral Lower Leg o Silver Alginate Secondary Dressing Wound #1 Right,Lateral Lower Leg o XtraSorb Wound #2 Left,Lateral Lower Leg o XtraSorb Dressing Change Frequency Wound #1 Right,Lateral Lower Leg o Change Dressing Monday, Wednesday, Friday - Friday in office, Monday and Wednesday by [facility] Wound #2 Left,Lateral Lower Leg o Change Dressing Monday, Wednesday, Friday - Friday in office, Monday and Wednesday by [facility] [place], Iantha Fallen ([id number]) Follow-up Appointments o Return Appointment in 2 weeks. - Monday or Tuesday Edema Control Wound #1 Right,Lateral Lower Leg o Unna Boots Bilaterally o Elevate legs to the level of the heart and pump ankles as often as possible Wound #2 Left,Lateral Lower Leg o Unna Boots Bilaterally o Elevate legs to the level of the heart and pump ankles as often as possible Home Health Wound #1 Right,Lateral Lower Leg o Continue [facility] Visits - [facility] o Home Health Nurse may visit PRN to address patientos wound care needs. o FACE TO FACE ENCOUNTER: MEDICARE and MEDICAID PATIENTS: I certify  that this patient is under my care and that I had a face-to-face encounter that meets the physician face-to-face encounter requirements with this patient on this date. The encounter with the patient was in whole or in part for the following MEDICAL CONDITION: (primary reason for Home Healthcare) MEDICAL NECESSITY: I certify, that based on my findings, NURSING services are a medically necessary home health service. HOME BOUND STATUS: I certify that my clinical findings support that this patient is homebound (i.e., Due to illness or injury, pt requires aid of supportive devices such as crutches, cane, wheelchairs, walkers, the use of special transportation or the assistance of another person to leave their place of residence. There is a normal inability to leave the home and doing so requires considerable and taxing effort. Other absences are for medical reasons / religious services and are infrequent or of short duration when for other reasons). o If current dressing causes regression in wound condition, may D/C ordered dressing product/s and apply Normal Saline Moist Dressing daily until next Wound Healing Center / Other MD appointment. Notify Wound Healing Center of regression in wound condition at [phone]. o Please direct any NON-WOUND related issues/requests for orders to patient's Primary Care Physician Wound #2 Left,Lateral Lower Leg o Continue [facility] Visits - [facility] o Home Health Nurse may visit PRN to address patientos wound care needs. o FACE TO FACE ENCOUNTER: MEDICARE and MEDICAID PATIENTS: I certify that this patient is under my care and that I had a face-to-face encounter that meets the physician face-to-face encounter requirements with this patient on this date. The encounter with the patient was in whole or in part for the following MEDICAL CONDITION: (primary reason for Home Healthcare) MEDICAL NECESSITY: I certify, that based on my findings, NURSING services  are a medically necessary home health service. HOME BOUND STATUS: I certify that my clinical findings support that this patient is homebound (i.e., Due to illness or injury, pt requires aid of supportive devices such as crutches, cane, wheelchairs, walkers, the use of special transportation or the assistance of another person to leave their place of residence. There is a normal inability to leave the home and doing so requires considerable  and taxing effort. Other absences are for medical reasons / religious services and are infrequent or of short duration when for other reasons). o If current dressing causes regression in wound condition, may D/C ordered dressing product/s and apply Normal Saline Moist Dressing daily until next Wound Healing Center / Other MD appointment. Notify Wound Healing Center of regression in wound condition at [phone]. o Please direct any NON-WOUND related issues/requests for orders to patient's Primary Care Physician Electronic Signature(s) Signed: 02/02/2019 3:43:45 PM By: Curtis Sites Signed: 02/02/2019 4:46:07 PM By: Lenda Kelp PA-C Entered By: Curtis Sites on 02/02/2019 14:16:45 AKBAR, SACRA ([id number]) DADRIAN, BALLANTINE ([id number]) -------------------------------------------------------------------------------- Problem List Details Patient Name: [name] [name] Date of Service: 02/02/2019 1:45 PM Medical Record Number: [id number] Patient Account Number: [id number] Date of Birth/Sex: Jul 30, 1960 (58 y.o. M) Treating RN: Curtis Sites Primary Care Provider: Olevia Perches Other Clinician: Referring Provider: Olevia Perches Treating Provider/Extender: Linwood Dibbles, [name] Weeks in Treatment: 2 Active Problems ICD-10 Evaluated Encounter Code Description Active Date Today Diagnosis I87.2 Venous insufficiency (chronic) (peripheral) 01/18/2019 No Yes L97.822 Non-pressure chronic ulcer of other part of left lower leg with 01/18/2019 No Yes fat  layer exposed L97.812 Non-pressure chronic ulcer of other part of right lower leg 01/18/2019 No Yes with fat layer exposed I10 Essential (primary) hypertension 01/18/2019 No Yes J44.9 Chronic obstructive pulmonary disease, unspecified 01/18/2019 No Yes Z79.01 Long term (current) use of anticoagulants 01/18/2019 No Yes I48.0 Paroxysmal atrial fibrillation 01/18/2019 No Yes Z86.711 Personal history of pulmonary embolism 01/18/2019 No Yes I50.42 Chronic combined systolic (congestive) and diastolic 01/18/2019 No Yes (congestive) heart failure Inactive Problems ZAKARIE, STURDIVANT ([id number]) Resolved Problems Electronic Signature(s) Signed: 02/02/2019 1:38:19 PM By: Lenda Kelp PA-C Entered By: Lenda Kelp on 02/02/2019 13:38:19 [name] [name] ([id number]) -------------------------------------------------------------------------------- Progress Note Details Patient Name: [name] [name] Date of Service: 02/02/2019 1:45 PM Medical Record Number: [id number] Patient Account Number: [id number] Date of Birth/Sex: 1960/12/01 (58 y.o. M) Treating RN: Curtis Sites Primary Care Provider: Olevia Perches Other Clinician: Referring Provider: Olevia Perches Treating Provider/Extender: Linwood Dibbles, Shamika Pedregon Weeks in Treatment: 2 Subjective Chief Complaint Information obtained from Patient Bilateral LE ulcers History of Present Illness (HPI) 01/18/2019 on evaluation today patient presents for initial evaluation here in our clinic concerning issues he is been having with his bilateral lower extremities. He appears based on what I am seeing to have bilateral lower extremity cellulitis along with chronic venous stasis. He has significant pitting edema even in the bottom of his foot. With that being said he also has a history of hypertension, COPD, congestive heart failure, long-term use of anticoagulants due to atrial fibrillation which is paroxysmal as well as having had a pulmonary embolism in the past.  With that being said he tells me that he has constant drainage out of his left lower extremity. This is likely due to the fact that again he has significant swelling pretty much all the time. With that being said in the past 2 weeks this has been getting worse and he tells me at this point that he feels like it is infected is also hurting a lot more than it has been in the past and the drainage is a different color. The left side is definitely worse than the right. Fortunately there is no signs of active infection systemically although I am concerned due to the severity of the infection that I see in the left lower extremity especially. There is a lot of necrotic tissue on the surface of both wounds that at  some point is going to need to be cleaned off to allow these to heal but right now obviously this is much too tender for him. No fevers, chills, nausea, vomiting, or diarrhea. 02/02/2019 on evaluation today patient presents for follow-up concerning the wounds over his bilateral lower extremities. He is continuing to manage. He was in the hospital and has been discharged at this point. He was admitted from 01/21/2019 through 01/27/2019. This was due to cellulitis of lower extremity. Nonetheless he does seem to be doing much better at this point. I did review his discharge summary as well he did not appear to have any positive findings on blood cultures. His white blood cell count was never really elevated either which is also good news. He was treated with IV clindamycin for 4 days and then switch to complete a 10-day course at home which she is still working on at this point. They did have him on IV Lasix in the hospital which helped him diurese well on discharge they stated that his weight was 408 pounds which is still higher than his dry weight of 375 to 380 pounds. Upon discharge his torsemide was increased from 40 to 60 mg twice daily and he is to follow-up with cardiology as well. Patient  History Information obtained from Patient. Family History Cancer - Father,Mother, Heart Disease - Mother,Maternal Grandparents, Hypertension - Maternal Grandparents,Mother, No family history of Diabetes, Hereditary Spherocytosis, Kidney Disease, Lung Disease, Seizures, Stroke, Thyroid Problems, Tuberculosis. Social History Never smoker, Marital Status - Widowed, Alcohol Use - Rarely, Drug Use - No History, Caffeine Use - Daily. Medical History Respiratory Patient has history of Asthma, Chronic Obstructive Pulmonary Disease (COPD), Sleep Apnea Denies history of Aspiration, Pneumothorax, Tuberculosis Cardiovascular Patient has history of Arrhythmia - a fib, Congestive Heart Failure, Hypertension [name] MajesticHUBBARD, Toby ([id number]) Denies history of Angina, Coronary Artery Disease, Deep Vein Thrombosis, Hypotension, Myocardial Infarction, Peripheral Arterial Disease, Peripheral Venous Disease, Phlebitis, Vasculitis Endocrine Patient has history of Type II Diabetes Denies history of Type I Diabetes Integumentary (Skin) Denies history of History of Burn, History of pressure wounds Musculoskeletal Denies history of Gout, Rheumatoid Arthritis, Osteoarthritis, Osteomyelitis Neurologic Denies history of Dementia, Neuropathy, Quadriplegia, Paraplegia, Seizure Disorder Medical And Surgical History Notes Respiratory chronic resp failure, PE Musculoskeletal back pain Neurologic TBI Review of Systems (ROS) Constitutional Symptoms (General Health) Denies complaints or symptoms of Fatigue, Fever, Chills, Marked Weight Change. Respiratory Denies complaints or symptoms of Chronic or frequent coughs, Shortness of Breath. Cardiovascular Complains or has symptoms of LE edema. Denies complaints or symptoms of Chest pain. Psychiatric Denies complaints or symptoms of Anxiety, Claustrophobia. Objective Constitutional Obese and well-hydrated in no acute distress. Vitals Time Taken: 1:23 PM, Height: 68  in, Weight: 400 lbs, BMI: 60.8, Temperature: 98.8 F, Pulse: 60 bpm, Respiratory Rate: 20 breaths/min, Blood Pressure: 142/66 mmHg. Respiratory normal breathing without difficulty. clear to auscultation bilaterally. Cardiovascular regular rate and rhythm with normal S1, S2. Psychiatric this patient is able to make decisions and demonstrates good insight into disease process. Alert and Oriented x 3. pleasant and cooperative. [name] MajesticHUBBARD, Selestino ([id number]) General Notes: Upon inspection today patient did have slough noted on both lower extremities the right was worse than the left. This is good to require some sharp debridement to help clear away some of the necrotic debris which was performed today without complication he did not have any significant pain. On the left I did not perform sharp debridement as this appears to be much less loose and much more adhered  to the base of the wound which I think is good to be more problematic for him as well. Integumentary (Hair, Skin) Wound #1 status is Open. Original cause of wound was Gradually Appeared. The wound is located on the Right,Lateral Lower Leg. The wound measures 5.5cm length x 12cm width x 0.1cm depth; 51.836cm^2 area and 5.184cm^3 volume. There is Fat Layer (Subcutaneous Tissue) Exposed exposed. There is no tunneling or undermining noted. There is a large amount of serous drainage noted. The wound margin is indistinct and nonvisible. There is medium (34-66%) pink granulation within the wound bed. There is a medium (34-66%) amount of necrotic tissue within the wound bed including Adherent Slough. Wound #2 status is Open. Original cause of wound was Gradually Appeared. The wound is located on the Left,Lateral Lower Leg. The wound measures 9cm length x 18cm width x 0.1cm depth; 127.235cm^2 area and 12.723cm^3 volume. There is Fat Layer (Subcutaneous Tissue) Exposed exposed. There is no tunneling or undermining noted. There is a large amount  of serous drainage noted. The wound margin is indistinct and nonvisible. There is small (1-33%) pink granulation within the wound bed. There is a large (67-100%) amount of necrotic tissue within the wound bed including Adherent Slough. Assessment Active Problems ICD-10 Venous insufficiency (chronic) (peripheral) Non-pressure chronic ulcer of other part of left lower leg with fat layer exposed Non-pressure chronic ulcer of other part of right lower leg with fat layer exposed Essential (primary) hypertension Chronic obstructive pulmonary disease, unspecified Long term (current) use of anticoagulants Paroxysmal atrial fibrillation Personal history of pulmonary embolism Chronic combined systolic (congestive) and diastolic (congestive) heart failure Procedures Wound #1 Pre-procedure diagnosis of Wound #1 is a Venous Leg Ulcer located on the Right,Lateral Lower Leg .Severity of Tissue Pre Debridement is: Fat layer exposed. There was a Excisional Skin/Subcutaneous Tissue Debridement with a total area of 66 sq cm performed by STONE III, Kenyan Karnes E., PA-C. With the following instrument(s): Curette to remove Viable and Non-Viable tissue/material. Material removed includes Subcutaneous Tissue and Slough and after achieving pain control using Lidocaine 4% Topical Solution. No specimens were taken. A time out was conducted at 14:10, prior to the start of the procedure. A Minimum amount of bleeding was controlled with Pressure. The procedure was tolerated well with a pain level of 0 throughout and a pain level of 0 following the procedure. Post Debridement Measurements: 5.5cm length x 12cm width x 0.2cm depth; 10.367cm^3 volume. Character of Wound/Ulcer Post Debridement is improved. Severity of Tissue Post Debridement is: Fat layer exposed. Post procedure Diagnosis Wound #1: Same as Pre-Procedure GERRICK, RAY ([id number]) Plan Wound Cleansing: Wound #1 Right,Lateral Lower Leg: Clean wound with Normal  Saline. Cleanse wound with mild soap and water Wound #2 Left,Lateral Lower Leg: Clean wound with Normal Saline. Cleanse wound with mild soap and water Primary Wound Dressing: Wound #1 Right,Lateral Lower Leg: Silver Alginate Wound #2 Left,Lateral Lower Leg: Silver Alginate Secondary Dressing: Wound #1 Right,Lateral Lower Leg: XtraSorb Wound #2 Left,Lateral Lower Leg: XtraSorb Dressing Change Frequency: Wound #1 Right,Lateral Lower Leg: Change Dressing Monday, Wednesday, Friday - Friday in office, Monday and Wednesday by [facility] Wound #2 Left,Lateral Lower Leg: Change Dressing Monday, Wednesday, Friday - Friday in office, Monday and Wednesday by [facility] Follow-up Appointments: Return Appointment in 2 weeks. - Monday or Tuesday Edema Control: Wound #1 Right,Lateral Lower Leg: Unna Boots Bilaterally Elevate legs to the level of the heart and pump ankles as often as possible Wound #2 Left,Lateral Lower Leg: Unna Boots Bilaterally Elevate legs  to the level of the heart and pump ankles as often as possible Home Health: Wound #1 Right,Lateral Lower Leg: Continue Home Health Visits - [facility] Nurse may visit PRN to address patient s wound care needs. FACE TO FACE ENCOUNTER: MEDICARE and MEDICAID PATIENTS: I certify that this patient is under my care and that I had a face-to-face encounter that meets the physician face-to-face encounter requirements with this patient on this date. The encounter with the patient was in whole or in part for the following MEDICAL CONDITION: (primary reason for Home Healthcare) MEDICAL NECESSITY: I certify, that based on my findings, NURSING services are a medically necessary home health service. HOME BOUND STATUS: I certify that my clinical findings support that this patient is homebound (i.e., Due to illness or injury, pt requires aid of supportive devices such as crutches, cane, wheelchairs, walkers, the use of special transportation or the assistance  of another person to leave their place of residence. There is a normal inability to leave the home and doing so requires considerable and taxing effort. Other absences are for medical reasons / religious services and are infrequent or of short duration when for other reasons). If current dressing causes regression in wound condition, may D/C ordered dressing product/s and apply Normal Saline Moist Dressing daily until next Wound Healing Center / Other MD appointment. Notify Wound Healing Center of regression in wound condition at [phone]. Please direct any NON-WOUND related issues/requests for orders to patient's Primary Care Physician Wound #2 Left,Lateral Lower Leg: Continue Home Health Visits - [facility] Nurse may visit PRN to address patient s wound care needs. FACE TO FACE ENCOUNTER: MEDICARE and MEDICAID PATIENTS: I certify that this patient is under my care and that I had a face-to-face encounter that meets the physician face-to-face encounter requirements with this patient on this date. The encounter with the patient was in whole or in part for the following MEDICAL CONDITION: (primary reason for Home [name] MajesticHUBBARD, Cederic ([id number]) Healthcare) MEDICAL NECESSITY: I certify, that based on my findings, NURSING services are a medically necessary home health service. HOME BOUND STATUS: I certify that my clinical findings support that this patient is homebound (i.e., Due to illness or injury, pt requires aid of supportive devices such as crutches, cane, wheelchairs, walkers, the use of special transportation or the assistance of another person to leave their place of residence. There is a normal inability to leave the home and doing so requires considerable and taxing effort. Other absences are for medical reasons / religious services and are infrequent or of short duration when for other reasons). If current dressing causes regression in wound condition, may D/C ordered dressing  product/s and apply Normal Saline Moist Dressing daily until next Wound Healing Center / Other MD appointment. Notify Wound Healing Center of regression in wound condition at [phone]. Please direct any NON-WOUND related issues/requests for orders to patient's Primary Care Physician 1 based on my evaluation today I would recommend that we continue to monitor the patient on a regular basis I think that is definitely going to be necessary. Were also going to initiate home health for the patient as well as I think that would be beneficial. 2. We will utilize silver alginate dressings followed by Anola GurneyXtraSorb at this point to help with fluid management. 3. I am also going to recommend that at this point we continue with Unna boots bilaterally which she has been tolerating well so recommend the patient elevate his legs as much as possible.  4. I recommend the patient continue with the increase in torsemide which was recommended for him at the hospital and subsequently he should also complete the clindamycin orally. We will see patient back for reevaluation in 1 week here in the clinic. If anything worsens or changes patient will contact our office for additional recommendations. Electronic Signature(s) Signed: 02/02/2019 4:09:25 PM By: Lenda Kelp PA-C Entered By: Lenda Kelp on 02/02/2019 16:09:25 ANSON, PEDDIE ([id number]) -------------------------------------------------------------------------------- ROS/PFSH Details Patient Name: [name] [name] Date of Service: 02/02/2019 1:45 PM Medical Record Number: [id number] Patient Account Number: [id number] Date of Birth/Sex: November 01, 1960 (58 y.o. M) Treating RN: Curtis Sites Primary Care Provider: Olevia Perches Other Clinician: Referring Provider: Olevia Perches Treating Provider/Extender: Linwood Dibbles, Brayam Boeke Weeks in Treatment: 2 Information Obtained From Patient Constitutional Symptoms (General Health) Complaints and  Symptoms: Negative for: Fatigue; Fever; Chills; Marked Weight Change Respiratory Complaints and Symptoms: Negative for: Chronic or frequent coughs; Shortness of Breath Medical History: Positive for: Asthma; Chronic Obstructive Pulmonary Disease (COPD); Sleep Apnea Negative for: Aspiration; Pneumothorax; Tuberculosis Past Medical History Notes: chronic resp failure, PE Cardiovascular Complaints and Symptoms: Positive for: LE edema Negative for: Chest pain Medical History: Positive for: Arrhythmia - a fib; Congestive Heart Failure; Hypertension Negative for: Angina; Coronary Artery Disease; Deep Vein Thrombosis; Hypotension; Myocardial Infarction; Peripheral Arterial Disease; Peripheral Venous Disease; Phlebitis; Vasculitis Psychiatric Complaints and Symptoms: Negative for: Anxiety; Claustrophobia Endocrine Medical History: Positive for: Type II Diabetes Negative for: Type I Diabetes Treated with: Diet Integumentary (Skin) Medical History: Negative for: History of Burn; History of pressure wounds Musculoskeletal ZAELYN, BARBARY ([id number]) Medical History: Negative for: Gout; Rheumatoid Arthritis; Osteoarthritis; Osteomyelitis Past Medical History Notes: back pain Neurologic Medical History: Negative for: Dementia; Neuropathy; Quadriplegia; Paraplegia; Seizure Disorder Past Medical History Notes: TBI Immunizations Pneumococcal Vaccine: Received Pneumococcal Vaccination: No Implantable Devices None Family and Social History Cancer: Yes - Father,Mother; Diabetes: No; Heart Disease: Yes - Mother,Maternal Grandparents; Hereditary Spherocytosis: No; Hypertension: Yes - Maternal Grandparents,Mother; Kidney Disease: No; Lung Disease: No; Seizures: No; Stroke: No; Thyroid Problems: No; Tuberculosis: No; Never smoker; Marital Status - Widowed; Alcohol Use: Rarely; Drug Use: No History; Caffeine Use: Daily; Financial Concerns: No; Food, Clothing or Shelter Needs: No; Support  System Lacking: No; Transportation Concerns: No Physician Affirmation I have reviewed and agree with the above information. Electronic Signature(s) Signed: 02/02/2019 4:46:07 PM By: Lenda Kelp PA-C Signed: 02/05/2019 4:13:29 PM By: Curtis Sites Entered By: Lenda Kelp on 02/02/2019 16:08:05 HALBERT, JESSON ([id number]) -------------------------------------------------------------------------------- SuperBill Details Patient Name: [name] [name] Date of Service: 02/02/2019 Medical Record Number: [id number] Patient Account Number: [id number] Date of Birth/Sex: 10/09/1960 (58 y.o. M) Treating RN: Curtis Sites Primary Care Provider: Olevia Perches Other Clinician: Referring Provider: Olevia Perches Treating Provider/Extender: Linwood Dibbles, Teila Skalsky Weeks in Treatment: 2 Diagnosis Coding ICD-10 Codes Code Description I87.2 Venous insufficiency (chronic) (peripheral) L97.822 Non-pressure chronic ulcer of other part of left lower leg with fat layer exposed L97.812 Non-pressure chronic ulcer of other part of right lower leg with fat layer exposed I10 Essential (primary) hypertension J44.9 Chronic obstructive pulmonary disease, unspecified Z79.01 Long term (current) use of anticoagulants I48.0 Paroxysmal atrial fibrillation Z86.711 Personal history of pulmonary embolism I50.42 Chronic combined systolic (congestive) and diastolic (congestive) heart failure Facility Procedures CPT4 Code Description: 36644034 11042 - DEB SUBQ TISSUE 20 SQ CM/< ICD-10 Diagnosis Description L97.812 Non-pressure chronic ulcer of other part of right lower leg wi Modifier: th fat layer expo Quantity: 1 sed CPT4 Code Description: 74259563 11045 - DEB SUBQ TISS EA  ADDL 20CM ICD-10 Diagnosis Description L97.812 Non-pressure chronic ulcer of other part of right lower leg wi Modifier: th fat layer expo Quantity: 3 sed Physician Procedures CPT4 Code Description: 1610960 11042 - WC PHYS SUBQ TISS 20 SQ CM  ICD-10 Diagnosis Description L97.812 Non-pressure chronic ulcer of other part of right lower leg with Modifier: fat layer expo Quantity: 1 sed CPT4 Code Description: 4540981 11045 - WC PHYS SUBQ TISS EA ADDL 20 CM ICD-10 Diagnosis Description L97.812 Non-pressure chronic ulcer of other part of right lower leg with Modifier: fat layer expo Quantity: 3 sed Electronic Signature(s) Signed: 02/02/2019 4:09:45 PM By: Lenda Kelp PA-C Entered By: Lenda Kelp on 02/02/2019 16:09:44

## 2019-02-03 DIAGNOSIS — S069X9S Unspecified intracranial injury with loss of consciousness of unspecified duration, sequela: Secondary | ICD-10-CM | POA: Diagnosis not present

## 2019-02-04 DIAGNOSIS — S069X9S Unspecified intracranial injury with loss of consciousness of unspecified duration, sequela: Secondary | ICD-10-CM | POA: Diagnosis not present

## 2019-02-05 DIAGNOSIS — S069X9S Unspecified intracranial injury with loss of consciousness of unspecified duration, sequela: Secondary | ICD-10-CM | POA: Diagnosis not present

## 2019-02-06 DIAGNOSIS — S069X9S Unspecified intracranial injury with loss of consciousness of unspecified duration, sequela: Secondary | ICD-10-CM | POA: Diagnosis not present

## 2019-02-07 DIAGNOSIS — S069X9S Unspecified intracranial injury with loss of consciousness of unspecified duration, sequela: Secondary | ICD-10-CM | POA: Diagnosis not present

## 2019-02-07 DIAGNOSIS — I872 Venous insufficiency (chronic) (peripheral): Secondary | ICD-10-CM | POA: Diagnosis not present

## 2019-02-08 DIAGNOSIS — S069X9S Unspecified intracranial injury with loss of consciousness of unspecified duration, sequela: Secondary | ICD-10-CM | POA: Diagnosis not present

## 2019-02-09 DIAGNOSIS — S069X9S Unspecified intracranial injury with loss of consciousness of unspecified duration, sequela: Secondary | ICD-10-CM | POA: Diagnosis not present

## 2019-02-10 DIAGNOSIS — S069X9S Unspecified intracranial injury with loss of consciousness of unspecified duration, sequela: Secondary | ICD-10-CM | POA: Diagnosis not present

## 2019-02-11 DIAGNOSIS — S069X9S Unspecified intracranial injury with loss of consciousness of unspecified duration, sequela: Secondary | ICD-10-CM | POA: Diagnosis not present

## 2019-02-12 DIAGNOSIS — S069X9S Unspecified intracranial injury with loss of consciousness of unspecified duration, sequela: Secondary | ICD-10-CM | POA: Diagnosis not present

## 2019-02-13 ENCOUNTER — Other Ambulatory Visit: Payer: Self-pay

## 2019-02-13 ENCOUNTER — Encounter: Payer: Medicaid Other | Admitting: Internal Medicine

## 2019-02-13 DIAGNOSIS — I890 Lymphedema, not elsewhere classified: Secondary | ICD-10-CM | POA: Diagnosis not present

## 2019-02-13 DIAGNOSIS — E669 Obesity, unspecified: Secondary | ICD-10-CM | POA: Diagnosis not present

## 2019-02-13 DIAGNOSIS — L97212 Non-pressure chronic ulcer of right calf with fat layer exposed: Secondary | ICD-10-CM | POA: Diagnosis not present

## 2019-02-13 DIAGNOSIS — L03116 Cellulitis of left lower limb: Secondary | ICD-10-CM | POA: Diagnosis not present

## 2019-02-13 DIAGNOSIS — E11622 Type 2 diabetes mellitus with other skin ulcer: Secondary | ICD-10-CM | POA: Diagnosis not present

## 2019-02-13 DIAGNOSIS — I480 Paroxysmal atrial fibrillation: Secondary | ICD-10-CM | POA: Diagnosis not present

## 2019-02-13 DIAGNOSIS — Z8249 Family history of ischemic heart disease and other diseases of the circulatory system: Secondary | ICD-10-CM | POA: Diagnosis not present

## 2019-02-13 DIAGNOSIS — Z7901 Long term (current) use of anticoagulants: Secondary | ICD-10-CM | POA: Diagnosis not present

## 2019-02-13 DIAGNOSIS — S069X9S Unspecified intracranial injury with loss of consciousness of unspecified duration, sequela: Secondary | ICD-10-CM | POA: Diagnosis not present

## 2019-02-13 DIAGNOSIS — G4730 Sleep apnea, unspecified: Secondary | ICD-10-CM | POA: Diagnosis not present

## 2019-02-13 DIAGNOSIS — I5042 Chronic combined systolic (congestive) and diastolic (congestive) heart failure: Secondary | ICD-10-CM | POA: Diagnosis not present

## 2019-02-13 DIAGNOSIS — I872 Venous insufficiency (chronic) (peripheral): Secondary | ICD-10-CM | POA: Diagnosis not present

## 2019-02-13 DIAGNOSIS — I110 Hypertensive heart disease with heart failure: Secondary | ICD-10-CM | POA: Diagnosis not present

## 2019-02-13 DIAGNOSIS — L97822 Non-pressure chronic ulcer of other part of left lower leg with fat layer exposed: Secondary | ICD-10-CM | POA: Diagnosis not present

## 2019-02-13 DIAGNOSIS — J449 Chronic obstructive pulmonary disease, unspecified: Secondary | ICD-10-CM | POA: Diagnosis not present

## 2019-02-13 DIAGNOSIS — Z6841 Body Mass Index (BMI) 40.0 and over, adult: Secondary | ICD-10-CM | POA: Diagnosis not present

## 2019-02-13 DIAGNOSIS — Z86711 Personal history of pulmonary embolism: Secondary | ICD-10-CM | POA: Diagnosis not present

## 2019-02-13 DIAGNOSIS — J9610 Chronic respiratory failure, unspecified whether with hypoxia or hypercapnia: Secondary | ICD-10-CM | POA: Diagnosis not present

## 2019-02-13 DIAGNOSIS — L03115 Cellulitis of right lower limb: Secondary | ICD-10-CM | POA: Diagnosis not present

## 2019-02-13 DIAGNOSIS — L97812 Non-pressure chronic ulcer of other part of right lower leg with fat layer exposed: Secondary | ICD-10-CM | POA: Diagnosis not present

## 2019-02-13 DIAGNOSIS — I878 Other specified disorders of veins: Secondary | ICD-10-CM | POA: Diagnosis not present

## 2019-02-13 NOTE — Progress Notes (Addendum)
[name] [name], [name] [name] ([id number]) Visit Report for 02/13/2019 Arrival Information Details Patient Name: [name] [name], [name] [name] Date of Service: 02/13/2019 3:45 PM Medical Record Number: [id number] Patient Account Number: [id number] Date of Birth/Sex: [birth year] (58 y.o. M) Treating RN: Montey Hora Primary Care Hargis Vandyne: Park Liter Other Clinician: Referring Cherell Colvin: Park Liter Treating Montasia Chisenhall/Extender: Beverly Gust in Treatment: 3 Visit Information History Since Last Visit Added or deleted any medications: No Patient Arrived: Wheel Chair Any new allergies or adverse reactions: No Arrival Time: 15:43 Had a fall or experienced change in No Accompanied By: self activities of daily living that may affect Transfer Assistance: None risk of falls: Patient Identification Verified: Yes Signs or symptoms of abuse/neglect since last visito No Secondary Verification Process Yes Hospitalized since last visit: No Completed: Implantable device outside of the clinic excluding No Patient Has Alerts: Yes cellular tissue based products placed in the center Patient Alerts: Patient on Blood since last visit: Thinner Has Dressing in Place as Prescribed: Yes Warfarin Has Compression in Place as Prescribed: Yes Pain Present Now: Yes Electronic Signature(s) Signed: 02/13/2019 4:02:53 PM By: Lorine Bears RCP, RRT, CHT Entered By: Lorine Bears on 02/13/2019 15:44:11 [name] [name] ([id number]) -------------------------------------------------------------------------------- Lower Extremity Assessment Details Patient Name: [name] [name] Date of Service: 02/13/2019 3:45 PM Medical Record Number: [id number] Patient Account Number: [id number] Date of Birth/Sex: 03-31-1960 (58 y.o. M) Treating RN: Montey Hora Primary Care Deola Rewis: Park Liter Other Clinician: Referring Leiani Enright: Park Liter Treating Nain Rudd/Extender: Beverly Gust in  Treatment: 3 Edema Assessment Assessed: [Left: No] [Right: No] [Left: Edema] [Right: :] Calf Left: Right: Point of Measurement: 34 cm From Medial Instep 55 cm 55.5 cm Ankle Left: Right: Point of Measurement: 14 cm From Medial Instep 39 cm 39 cm Vascular Assessment Pulses: Dorsalis Pedis Palpable: [Left:Yes] [Right:Yes] Posterior Tibial Palpable: [Left:Yes] [Right:Yes] Electronic Signature(s) Signed: 02/13/2019 4:50:32 PM By: Montey Hora Entered By: Montey Hora on 02/13/2019 16:26:37 [name] [name] ([id number]) -------------------------------------------------------------------------------- Multi Wound Chart Details Patient Name: [name] [name] Date of Service: 02/13/2019 3:45 PM Medical Record Number: [id number] Patient Account Number: [id number] Date of Birth/Sex: 1960-02-20 (58 y.o. 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/A Lower Extremity Lower Extremity Comorbid History: Asthma, Chronic Obstructive Asthma, Chronic Obstructive N/A Pulmonary Disease (COPD), Pulmonary Disease (COPD), Sleep Apnea, Arrhythmia, Sleep Apnea, Arrhythmia, Congestive Heart Failure, Congestive Heart Failure, Hypertension, Type II Diabetes Hypertension, Type II Diabetes Date Acquired: 12/24/2018 12/24/2018 N/A Weeks of Treatment: 3 3 N/A Wound Status: Open Open N/A Measurements L x W x D  4.5x6.5x0.1 9x10x0.1 N/A (cm) Area (cm) : 22.973 70.686 N/A Volume (cm) : 2.297 7.069 N/A % Reduction in Area: 71.90% 73.90% N/A % Reduction in Volume: 71.90% 73.90% N/A Classification: Full Thickness Without Full Thickness Without N/A Exposed Support Structures Exposed Support Structures Exudate Amount: Large Large N/A Exudate Type: Serous Serous N/A Exudate Color: amber amber N/A Wound Margin: Indistinct, nonvisible Indistinct, nonvisible N/A Granulation Amount: Medium (34-66%) Small (1-33%) N/A Granulation Quality: Pink Pink N/A Necrotic Amount: Medium (34-66%) Large (67-100%) N/A Exposed Structures: N/A [name] [name], [name] [name] ([id number]) Fat  Layer (Subcutaneous Fat Layer (Subcutaneous Tissue) Exposed: Yes Tissue) Exposed: Yes Fascia: No Fascia: No Tendon: No Tendon: No Muscle: No Muscle: No Joint: No Joint: No Bone: No Bone: No Epithelialization: None None N/A Treatment Notes Electronic Signature(s) Signed: 02/13/2019 4:50:32 PM By: Curtis Sites Entered By: Curtis Sites on 02/13/2019 16:26:59 [name] [name] ([id number]) -------------------------------------------------------------------------------- Multi-Disciplinary Care Plan Details Patient Name: [name] [name] Date of Service: 02/13/2019 3:45 PM Medical Record Number: [id number] Patient Account Number: [id number] Date of Birth/Sex: 24-Nov-1960 (58 y.o. M) Treating RN: Curtis Sites Primary Care Salome Hautala: Olevia Perches Other Clinician: Referring Angelys Yetman: Olevia Perches Treating Conall Vangorder/Extender: Maryla Morrow in Treatment: 3 Active Inactive Electronic Signature(s) Signed: 03/20/2019 9:15:27 AM By: Elliot Gurney, BSN, RN, CWS, Kim RN, BSN Signed: 04/06/2019 12:02:10 PM By: Curtis Sites Previous Signature: 02/13/2019 4:50:32 PM Version By: Curtis Sites Entered By: Elliot Gurney BSN, RN, CWS, Kim on 03/20/2019 09:15:27 [name] [name], [name] [name]  ([id number]) -------------------------------------------------------------------------------- Pain Assessment Details Patient Name: [name] [name] Date of Service: 02/13/2019 3:45 PM Medical Record Number: [id number] Patient Account Number: [id number] Date of Birth/Sex: 01/01/1961 (58 y.o. M) Treating RN: Curtis Sites Primary Care Imogean Ciampa: Olevia Perches Other Clinician: Referring Eitan Doubleday: Olevia Perches Treating Carney Saxton/Extender: Maryla Morrow in Treatment: 3 Active Problems Location of Pain Severity and Description of Pain Patient Has Paino Yes Site Locations Rate the pain. Current Pain Level: 8 Pain Management and Medication Current Pain Management: Electronic Signature(s) Signed: 02/13/2019 4:02:53 PM By: Dayton Martes RCP, RRT, CHT Signed: 02/13/2019 4:50:32 PM By: Curtis Sites Entered By: Dayton Martes on 02/13/2019 15:44:21 [name] [name], [name] [name] ([id number]) -------------------------------------------------------------------------------- Patient/Caregiver Education Details Patient Name: [name] [name] Date of Service: 02/13/2019 3:45 PM Medical Record Number: [id number] Patient Account Number: [id number] Date of Birth/Gender: 04-Feb-1961 (58 y.o. M) Treating RN: Curtis Sites Primary Care Physician: Olevia Perches Other Clinician: Referring Physician: Olevia Perches Treating Physician/Extender: Maryla Morrow in Treatment: 3 Education Assessment Education Provided To: Patient Education Topics Provided Venous: Handouts: Other: leg elevation Methods: Explain/Verbal Responses: State content correctly Electronic Signature(s) Signed: 02/13/2019 4:50:32 PM By: Curtis Sites Entered By: Curtis Sites on 02/13/2019 16:48:45 [name] [name] ([id number]) -------------------------------------------------------------------------------- Wound Assessment Details Patient Name: [name] [name] Date of Service: 02/13/2019 3:45  PM Medical Record Number: [id number] Patient Account Number: [id number] Date of Birth/Sex: 07/08/1960 (58 y.o. M) Treating RN: Curtis Sites Primary Care Gianella Chismar: Olevia Perches Other Clinician: Referring Shane Melby: Olevia Perches Treating Alandra Sando/Extender: Maryla Morrow in Treatment: 3 Wound Status Wound Number: 1 Primary Venous Leg Ulcer Etiology: Wound Location: Right Lower Leg - Lateral Secondary Diabetic Wound/Ulcer of the Lower Extremity Wounding Event: Gradually Appeared Etiology: Date Acquired: 12/24/2018 Wound Open Weeks Of Treatment: 3 Status: Clustered Wound: No Comorbid Asthma, Chronic Obstructive Pulmonary History: Disease (COPD), Sleep Apnea, Arrhythmia, Congestive Heart Failure, Hypertension, Type II Diabetes Photos Wound Measurements Length: (cm) 4.5 Width: (cm) 6.5 Depth: (cm) 0.1 Area: (cm) 22.973 Volume: (cm) 2.297 % Reduction in Area: 71.9% % Reduction in Volume: 71.9% Epithelialization: None Tunneling: No Undermining: No Wound Description Full Thickness Without Exposed Support Foul Odo Classification: Structures Slough/F Wound Margin: Indistinct, nonvisible Exudate Large Amount: Exudate Type: Serous Exudate Color: amber r After Cleansing: No ibrino Yes Wound Bed Granulation Amount: Medium (34-66%) Exposed Structure Granulation Quality: Pink Fascia Exposed: No Necrotic Amount: Medium (34-66%) Fat Layer (Subcutaneous Tissue) Exposed: Yes Necrotic Quality: Adherent Slough Tendon Exposed: No Muscle Exposed: No [name] [name], [name] [name] ([id number]) Joint Exposed: No Bone Exposed: No Electronic Signature(s) Signed: 02/13/2019 4:50:32 PM By: Curtis Sites Entered By: Curtis Sites on 02/13/2019 16:03:45 [name] [name] ([id number]) --------------------------------------------------------------------------------  Wound Assessment Details Patient Name: [name] [name], [name] [name] Date of Service: 02/13/2019 3:45 PM Medical Record Number:  [id number] Patient Account Number: [id number] Date of Birth/Sex: Jan 23, 1961 (58 y.o. M) Treating RN: Curtis Sites Primary Care Taino Maertens: Olevia Perches Other Clinician: Referring Thaison Kolodziejski: Olevia Perches Treating Saifullah Jolley/Extender: Maryla Morrow in Treatment: 3 Wound Status Wound Number: 2 Primary Venous Leg Ulcer Etiology: Wound Location: Left Lower Leg - Lateral Secondary Diabetic Wound/Ulcer of the Lower Extremity Wounding Event: Gradually Appeared Etiology: Date Acquired: 12/24/2018 Wound Open Weeks Of Treatment: 3 Status: Clustered Wound: No Comorbid Asthma, Chronic Obstructive Pulmonary History: Disease (COPD), Sleep Apnea, Arrhythmia, Congestive Heart Failure, Hypertension, Type II Diabetes Photos Wound Measurements Length: (cm) 9 Width: (cm) 10 Depth: (cm) 0.1 Area: (cm) 70.686 Volume: (cm) 7.069 % Reduction in Area: 73.9% % Reduction in Volume: 73.9% Epithelialization: None Tunneling: No Undermining: No Wound Description Full Thickness Without Exposed Support Foul Odo Classification: Structures Slough/F Wound Margin: Indistinct, nonvisible Exudate Large Amount: Exudate Type: Serous Exudate Color: amber r After Cleansing: No ibrino Yes Wound Bed Granulation Amount: Small (1-33%) Exposed Structure Granulation Quality: Pink Fascia Exposed: No Necrotic Amount: Large (67-100%) Fat Layer (Subcutaneous Tissue) Exposed: Yes Necrotic Quality: Adherent Slough Tendon Exposed: No Muscle Exposed: No BONIFACE, GOFFE ([id number]) Joint Exposed: No Bone Exposed: No Electronic Signature(s) Signed: 02/13/2019 4:50:32 PM By: Curtis Sites Entered By: Curtis Sites on 02/13/2019 16:04:15 [name] [name] ([id number]) -------------------------------------------------------------------------------- Vitals Details Patient Name: [name] [name] Date of Service: 02/13/2019 3:45 PM Medical Record Number: [id number] Patient Account Number: [id number] Date  of Birth/Sex: 11-11-1960 (58 y.o. M) Treating RN: Curtis Sites Primary Care Necie Wilcoxson: Olevia Perches Other Clinician: Referring Twala Collings: Olevia Perches Treating Juli Odom/Extender: Maryla Morrow in Treatment: 3 Vital Signs Time Taken: 15:45 Temperature (F): 99.3 Height (in): 68 Pulse (bpm): 68 Weight (lbs): 400 Respiratory Rate (breaths/min): 18 Body Mass Index (BMI): 60.8 Blood Pressure (mmHg): 141/57 Reference Range: 80 - 120 mg / dl Electronic Signature(s) Signed: 02/13/2019 4:02:53 PM By: Dayton Martes RCP, RRT, CHT Entered By: Dayton Martes on 02/13/2019 15:47:58

## 2019-02-13 NOTE — Progress Notes (Addendum)
[name], [name] ([id number]) Visit Report for 02/13/2019 Debridement Details Patient Name: [name] [name], [name] [name] Date of Service: 02/13/2019 3:45 PM Medical Record Number: [id number] Patient Account Number: [id number] Date of Birth/Sex: 1961/01/18 (58 y.o. M) Treating RN: Montey Hora Primary Care Provider: Park Liter Other Clinician: Referring Provider: Park Liter Treating Provider/Extender: Beverly Gust in Treatment: 3 Debridement Performed for Wound #1 Right,Lateral Lower Leg Assessment: Performed By: Physician Tobi Bastos, MD Debridement Type: Debridement Severity of Tissue Pre Fat layer exposed Debridement: Level of Consciousness (Pre- Awake and Alert procedure): Pre-procedure Verification/Time Yes - 16:27 Out Taken: Start Time: 16:27 Pain Control: Lidocaine 4% Topical Solution Total Area Debrided (L x W): 4.5 (cm) x 6.5 (cm) = 29.25 (cm) Tissue and other material Viable, Non-Viable, Slough, Subcutaneous, Slough debrided: Level: Skin/Subcutaneous Tissue Debridement Description: Excisional Instrument: Curette Bleeding: Minimum Hemostasis Achieved: Pressure End Time: 16:29 Procedural Pain: 0 Post Procedural Pain: 0 Response to Treatment: Procedure was tolerated well Level of Consciousness Awake and Alert (Post-procedure): Post Debridement Measurements of Total Wound Length: (cm) 4.5 Width: (cm) 6.5 Depth: (cm) 0.2 Volume: (cm) 4.595 Character of Wound/Ulcer Post Debridement: Improved Severity of Tissue Post Debridement: Fat layer exposed Post Procedure Diagnosis Same as Pre-procedure Electronic Signature(s) Signed: 02/13/2019 4:37:17 PM By: Tobi Bastos MD, MBA Signed: 02/13/2019 4:50:32 PM By: Montey Hora Entered By: Montey Hora on 02/13/2019 16:30:05 KRISHANG, READING ([id number]) BROOKLYN, JEFF ([id number]) -------------------------------------------------------------------------------- Debridement Details Patient Name:  [name] [name] Date of Service: 02/13/2019 3:45 PM Medical Record Number: [id number] Patient Account Number: [id number] Date of Birth/Sex: 1960-07-14 (58 y.o. M) Treating RN: Montey Hora Primary Care Provider: Park Liter Other Clinician: Referring Provider: Park Liter Treating Provider/Extender: Beverly Gust in Treatment: 3 Debridement Performed for Wound #2 Left,Lateral Lower Leg Assessment: Performed By: Physician Tobi Bastos, MD Debridement Type: Debridement Severity of Tissue Pre Fat layer exposed Debridement: Level of Consciousness (Pre- Awake and Alert procedure): Pre-procedure Verification/Time Yes - 16:29 Out Taken: Start Time: 16:29 Pain Control: Lidocaine 4% Topical Solution Total Area Debrided (L x W): 9 (cm) x 10 (cm) = 90 (cm) Tissue and other material Viable, Non-Viable, Slough, Subcutaneous, Slough debrided: Level: Skin/Subcutaneous Tissue Debridement Description: Excisional Instrument: Curette Bleeding: Minimum Hemostasis Achieved: Pressure End Time: 16:29 Procedural Pain: 0 Post Procedural Pain: 0 Response to Treatment: Procedure was tolerated well Level of Consciousness Awake and Alert (Post-procedure): Post Debridement Measurements of Total Wound Length: (cm) 9 Width: (cm) 10 Depth: (cm) 0.2 Volume: (cm) 14.137 Character of Wound/Ulcer Post Debridement: Improved Severity of Tissue Post Debridement: Fat layer exposed Post Procedure Diagnosis Same as Pre-procedure Electronic Signature(s) Signed: 02/13/2019 4:37:17 PM By: Tobi Bastos MD, MBA Signed: 02/13/2019 4:50:32 PM By: Montey Hora Entered By: Montey Hora on 02/13/2019 16:31:45 [name] [name] ([id number]) -------------------------------------------------------------------------------- HPI Details Patient Name: [name] [name] Date of Service: 02/13/2019 3:45 PM Medical Record Number: [id number] Patient Account Number: [id number] Date of Birth/Sex:  Jul 16, 1960 (58 y.o. M) Treating RN: Montey Hora Primary Care Provider: Park Liter Other Clinician: Referring Provider: Park Liter Treating Provider/Extender: Beverly Gust in Treatment: 3 History of Present Illness HPI Description: 01/18/2019 on evaluation today patient presents for initial evaluation here in our clinic concerning issues he is been having with his bilateral lower extremities. He appears based on what I am seeing to have bilateral lower extremity cellulitis along with chronic venous stasis. He has significant pitting edema even in the bottom of his foot. With that being said he also has a history of hypertension, COPD, congestive heart failure, long-term use of anticoagulants due to  atrial fibrillation which is paroxysmal as well as having had a pulmonary embolism in the past. With that being said he tells me that he has constant drainage out of his left lower extremity. This is likely due to the fact that again he has significant swelling pretty much all the time. With that being said in the past 2 weeks this has been getting worse and he tells me at this point that he feels like it is infected is also hurting a lot more than it has been in the past and the drainage is a different color. The left side is definitely worse than the right. Fortunately there is no signs of active infection systemically although I am concerned due to the severity of the infection that I see in the left lower extremity especially. There is a lot of necrotic tissue on the surface of both wounds that at some point is going to need to be cleaned off to allow these to heal but right now obviously this is much too tender for him. No fevers, chills, nausea, vomiting, or diarrhea. 02/02/2019 on evaluation today patient presents for follow-up concerning the wounds over his bilateral lower extremities. He is continuing to manage. He was in the hospital and has been discharged at this point. He  was admitted from 01/21/2019 through 01/27/2019. This was due to cellulitis of lower extremity. Nonetheless he does seem to be doing much better at this point. I did review his discharge summary as well he did not appear to have any positive findings on blood cultures. His white blood cell count was never really elevated either which is also good news. He was treated with IV clindamycin for 4 days and then switch to complete a 10-day course at home which she is still working on at this point. They did have him on IV Lasix in the hospital which helped him diurese well on discharge they stated that his weight was 408 pounds which is still higher than his dry weight of 375 to 380 pounds. Upon discharge his torsemide was increased from 40 to 60 mg twice daily and he is to follow-up with cardiology as well. 12/29-Patient returns at 2 weeks for evaluation and follow-up of his bilateral lower extremity wounds, with lymphedema, venous ulcerations, patient was recently hospitalized and was aggressively diuresed after which his torsemide has been increased. He follows up with cardiology as well. His left leg stasis wounds are more moist and have maceration of tissue, the right one is less so. We are using silver cell and unna compression given the degree of edema in the legs. Electronic Signature(s) Signed: 02/22/2019 2:59:42 PM By: Cassandria Anger MD, MBA Previous Signature: 02/19/2019 3:52:43 PM Version By: Elliot Gurney BSN, RN, CWS, Kim RN, BSN Previous Signature: 02/13/2019 4:34:47 PM Version By: Cassandria Anger MD, MBA Entered By: Cassandria Anger on 02/22/2019 14:59:41 BIAGIO, SNELSON ([id number]) -------------------------------------------------------------------------------- Physical Exam Details Patient Name: [name] [name] Date of Service: 02/13/2019 3:45 PM Medical Record Number: [id number] Patient Account Number: [id number] Date of Birth/Sex: Aug 02, 1960 (58 y.o. M) Treating RN: Curtis Sites Primary  Care Provider: Olevia Perches Other Clinician: Referring Provider: Olevia Perches Treating Provider/Extender: Maryla Morrow in Treatment: 3 Constitutional alert and oriented x 3. sitting or standing blood pressure is within target range for patient.. supine blood pressure is within target range for patient.. pulse regular and within target range for patient.[name] respirations regular, non-labored and within target range for patient.[name] temperature within target range for patient.. . . Well-nourished and well-hydrated in  no acute distress. Notes Significant lymphedema on both legs, the left leg circumferential wounds are moist, although not actively draining at this time significant degree of maceration is noted, this was debrided using a #5 curette The right lateral calf ulcer had fair amount of necrotic debris that was also removed using a #5 curette with some bleeding the was minimal. Electronic Signature(s) Signed: 02/13/2019 4:35:45 PM By: Cassandria Anger MD, MBA Entered By: Cassandria Anger on 02/13/2019 16:35:45 [name] [name] ([id number]) -------------------------------------------------------------------------------- Physician Orders Details Patient Name: [name] [name] Date of Service: 02/13/2019 3:45 PM Medical Record Number: [id number] Patient Account Number: [id number] Date of Birth/Sex: 1961-01-08 (58 y.o. M) Treating RN: Curtis Sites Primary Care Provider: Olevia Perches Other Clinician: Referring Provider: Olevia Perches Treating Provider/Extender: Maryla Morrow in Treatment: 3 Verbal / Phone Orders: No Diagnosis Coding Wound Cleansing Wound #1 Right,Lateral Lower Leg o Clean wound with Normal Saline. o Cleanse wound with mild soap and water Wound #2 Left,Lateral Lower Leg o Clean wound with Normal Saline. o Cleanse wound with mild soap and water Primary Wound Dressing Wound #1 Right,Lateral Lower Leg o Silver Alginate Wound #2 Left,Lateral  Lower Leg o Silver Alginate Secondary Dressing Wound #1 Right,Lateral Lower Leg o XtraSorb Wound #2 Left,Lateral Lower Leg o XtraSorb Dressing Change Frequency Wound #1 Right,Lateral Lower Leg o Change Dressing Monday, Wednesday, Friday Wound #2 Left,Lateral Lower Leg o Change Dressing Monday, Wednesday, Friday Follow-up Appointments o Return Appointment in 2 weeks. Edema Control Wound #1 Right,Lateral Lower Leg o Unna Boots Bilaterally o Elevate legs to the level of the heart and pump ankles as often as possible Wound #2 Left,Lateral Lower Leg o Unna Boots Bilaterally o Elevate legs to the level of the heart and pump ankles as often as possible Home Health DIEDRICH, IACONO ([id number]) Wound #1 Right,Lateral Lower Leg o Continue [facility] Visits - [facility] o Home Health Nurse may visit PRN to address patientos wound care needs. o FACE TO FACE ENCOUNTER: MEDICARE and MEDICAID PATIENTS: I certify that this patient is under my care and that I had a face-to-face encounter that meets the physician face-to-face encounter requirements with this patient on this date. The encounter with the patient was in whole or in part for the following MEDICAL CONDITION: (primary reason for Home Healthcare) MEDICAL NECESSITY: I certify, that based on my findings, NURSING services are a medically necessary home health service. HOME BOUND STATUS: I certify that my clinical findings support that this patient is homebound (i.e., Due to illness or injury, pt requires aid of supportive devices such as crutches, cane, wheelchairs, walkers, the use of special transportation or the assistance of another person to leave their place of residence. There is a normal inability to leave the home and doing so requires considerable and taxing effort. Other absences are for medical reasons / religious services and are infrequent or of short duration when for other reasons). o If current  dressing causes regression in wound condition, may D/C ordered dressing product/s and apply Normal Saline Moist Dressing daily until next Wound Healing Center / Other MD appointment. Notify Wound Healing Center of regression in wound condition at [phone]. o Please direct any NON-WOUND related issues/requests for orders to patient's Primary Care Physician Wound #2 Left,Lateral Lower Leg o Continue [facility] Visits - [facility] o Home Health Nurse may visit PRN to address patientos wound care needs. o FACE TO FACE ENCOUNTER: MEDICARE and MEDICAID PATIENTS: I certify that this patient is under my care and that I had a  face-to-face encounter that meets the physician face-to-face encounter requirements with this patient on this date. The encounter with the patient was in whole or in part for the following MEDICAL CONDITION: (primary reason for Home Healthcare) MEDICAL NECESSITY: I certify, that based on my findings, NURSING services are a medically necessary home health service. HOME BOUND STATUS: I certify that my clinical findings support that this patient is homebound (i.e., Due to illness or injury, pt requires aid of supportive devices such as crutches, cane, wheelchairs, walkers, the use of special transportation or the assistance of another person to leave their place of residence. There is a normal inability to leave the home and doing so requires considerable and taxing effort. Other absences are for medical reasons / religious services and are infrequent or of short duration when for other reasons). o If current dressing causes regression in wound condition, may D/C ordered dressing product/s and apply Normal Saline Moist Dressing daily until next Wound Healing Center / Other MD appointment. Notify Wound Healing Center of regression in wound condition at [phone]. o Please direct any NON-WOUND related issues/requests for orders to patient's Primary Care  Physician Electronic Signature(s) Signed: 02/13/2019 4:37:17 PM By: Cassandria AngerMadduri, Donyae Kohn MD, MBA Signed: 02/13/2019 4:50:32 PM By: Curtis Sitesorthy, Joanna Entered By: Curtis Sitesorthy, Joanna on 02/13/2019 16:32:49 [name] MajesticHUBBARD, Iyad ([id number]) -------------------------------------------------------------------------------- Progress Note Details Patient Name: [name] MajesticHUBBARD, [name] [name] Date of Service: 02/13/2019 3:45 PM Medical Record Number: [id number] Patient Account Number: [id number] Date of Birth/Sex: 23-Mar-1960 (58 y.o. M) Treating RN: Curtis Sitesorthy, Joanna Primary Care Provider: Olevia PerchesJOHNSON, MEGAN Other Clinician: Referring Provider: Olevia PerchesJOHNSON, MEGAN Treating Provider/Extender: Maryla MorrowMadduri, Otila Starn Weeks in Treatment: 3 Subjective History of Present Illness (HPI) 01/18/2019 on evaluation today patient presents for initial evaluation here in our clinic concerning issues he is been having with his bilateral lower extremities. He appears based on what I am seeing to have bilateral lower extremity cellulitis along with chronic venous stasis. He has significant pitting edema even in the bottom of his foot. With that being said he also has a history of hypertension, COPD, congestive heart failure, long-term use of anticoagulants due to atrial fibrillation which is paroxysmal as well as having had a pulmonary embolism in the past. With that being said he tells me that he has constant drainage out of his left lower extremity. This is likely due to the fact that again he has significant swelling pretty much all the time. With that being said in the past 2 weeks this has been getting worse and he tells me at this point that he feels like it is infected is also hurting a lot more than it has been in the past and the drainage is a different color. The left side is definitely worse than the right. Fortunately there is no signs of active infection systemically although I am concerned due to the severity of the infection that I see in the left lower  extremity especially. There is a lot of necrotic tissue on the surface of both wounds that at some point is going to need to be cleaned off to allow these to heal but right now obviously this is much too tender for him. No fevers, chills, nausea, vomiting, or diarrhea. 02/02/2019 on evaluation today patient presents for follow-up concerning the wounds over his bilateral lower extremities. He is continuing to manage. He was in the hospital and has been discharged at this point. He was admitted from 01/21/2019 through 01/27/2019. This was due to cellulitis of lower extremity. Nonetheless he does seem to be  doing much better at this point. I did review his discharge summary as well he did not appear to have any positive findings on blood cultures. His white blood cell count was never really elevated either which is also good news. He was treated with IV clindamycin for 4 days and then switch to complete a 10-day course at home which she is still working on at this point. They did have him on IV Lasix in the hospital which helped him diurese well on discharge they stated that his weight was 408 pounds which is still higher than his dry weight of 375 to 380 pounds. Upon discharge his torsemide was increased from 40 to 60 mg twice daily and he is to follow-up with cardiology as well. 12/29-Patient returns at 2 weeks for evaluation and follow-up of his bilateral lower extremity wounds, with lymphedema, venous ulcerations, patient was recently hospitalized and was aggressively diuresed after which his torsemide has been increased. He follows up with cardiology as well. His left leg stasis wounds are more moist and have maceration of tissue, the right one is less so. We are using silver cell and unna compression given the degree of edema in the legs. Objective Constitutional alert and oriented x 3. sitting or standing blood pressure is within target range for patient.. supine blood pressure is within target  range for patient.. pulse regular and within target range for patient.[name] respirations regular, non-labored and within target range for patient.[name] temperature within target range for patient.. Well-nourished and well-hydrated in no acute distress. Vitals Time Taken: 3:45 PM, Height: 68 in, Weight: 400 lbs, BMI: 60.8, Temperature: 99.3 F, Pulse: 68 bpm, Respiratory Rate: 18 breaths/min, Blood Pressure: 141/57 mmHg. ZEIN, HELBING ([id number]) General Notes: Significant lymphedema on both legs, the left leg circumferential wounds are moist, although not actively draining at this time significant degree of maceration is noted, this was debrided using a #5 curette The right lateral calf ulcer had fair amount of necrotic debris that was also removed using a #5 curette with some bleeding the was minimal. Integumentary (Hair, Skin) Wound #1 status is Open. Original cause of wound was Gradually Appeared. The wound is located on the Right,Lateral Lower Leg. The wound measures 4.5cm length x 6.5cm width x 0.1cm depth; 22.973cm^2 area and 2.297cm^3 volume. There is Fat Layer (Subcutaneous Tissue) Exposed exposed. There is no tunneling or undermining noted. There is a large amount of serous drainage noted. The wound margin is indistinct and nonvisible. There is medium (34-66%) pink granulation within the wound bed. There is a medium (34-66%) amount of necrotic tissue within the wound bed including Adherent Slough. Wound #2 status is Open. Original cause of wound was Gradually Appeared. The wound is located on the Left,Lateral Lower Leg. The wound measures 9cm length x 10cm width x 0.1cm depth; 70.686cm^2 area and 7.069cm^3 volume. There is Fat Layer (Subcutaneous Tissue) Exposed exposed. There is no tunneling or undermining noted. There is a large amount of serous drainage noted. The wound margin is indistinct and nonvisible. There is small (1-33%) pink granulation within the wound bed. There is a large  (67-100%) amount of necrotic tissue within the wound bed including Adherent Slough. Procedures Wound #1 Pre-procedure diagnosis of Wound #1 is a Venous Leg Ulcer located on the Right,Lateral Lower Leg .Severity of Tissue Pre Debridement is: Fat layer exposed. There was a Excisional Skin/Subcutaneous Tissue Debridement with a total area of 29.25 sq cm performed by Cassandria Anger, MD. With the following instrument(s): Curette to remove Viable  and Non-Viable tissue/material. Material removed includes Subcutaneous Tissue and Slough and after achieving pain control using Lidocaine 4% Topical Solution. No specimens were taken. A time out was conducted at 16:27, prior to the start of the procedure. A Minimum amount of bleeding was controlled with Pressure. The procedure was tolerated well with a pain level of 0 throughout and a pain level of 0 following the procedure. Post Debridement Measurements: 4.5cm length x 6.5cm width x 0.2cm depth; 4.595cm^3 volume. Character of Wound/Ulcer Post Debridement is improved. Severity of Tissue Post Debridement is: Fat layer exposed. Post procedure Diagnosis Wound #1: Same as Pre-Procedure Wound #2 Pre-procedure diagnosis of Wound #2 is a Venous Leg Ulcer located on the Left,Lateral Lower Leg .Severity of Tissue Pre Debridement is: Fat layer exposed. There was a Excisional Skin/Subcutaneous Tissue Debridement with a total area of 90 sq cm performed by Cassandria Anger, MD. With the following instrument(s): Curette to remove Viable and Non-Viable tissue/material. Material removed includes Subcutaneous Tissue and Slough and after achieving pain control using Lidocaine 4% Topical Solution. No specimens were taken. A time out was conducted at 16:29, prior to the start of the procedure. A Minimum amount of bleeding was controlled with Pressure. The procedure was tolerated well with a pain level of 0 throughout and a pain level of 0 following the procedure. Post Debridement  Measurements: 9cm length x 10cm width x 0.2cm depth; 14.137cm^3 volume. Character of Wound/Ulcer Post Debridement is improved. Severity of Tissue Post Debridement is: Fat layer exposed. Post procedure Diagnosis Wound #2: Same as Pre-Procedure Plan Wound Cleansing: BEATRICE, SEHGAL ([id number]) Wound #1 Right,Lateral Lower Leg: Clean wound with Normal Saline. Cleanse wound with mild soap and water Wound #2 Left,Lateral Lower Leg: Clean wound with Normal Saline. Cleanse wound with mild soap and water Primary Wound Dressing: Wound #1 Right,Lateral Lower Leg: Silver Alginate Wound #2 Left,Lateral Lower Leg: Silver Alginate Secondary Dressing: Wound #1 Right,Lateral Lower Leg: XtraSorb Wound #2 Left,Lateral Lower Leg: XtraSorb Dressing Change Frequency: Wound #1 Right,Lateral Lower Leg: Change Dressing Monday, Wednesday, Friday Wound #2 Left,Lateral Lower Leg: Change Dressing Monday, Wednesday, Friday Follow-up Appointments: Return Appointment in 2 weeks. Edema Control: Wound #1 Right,Lateral Lower Leg: Unna Boots Bilaterally Elevate legs to the level of the heart and pump ankles as often as possible Wound #2 Left,Lateral Lower Leg: Unna Boots Bilaterally Elevate legs to the level of the heart and pump ankles as often as possible Home Health: Wound #1 Right,Lateral Lower Leg: Continue Home Health Visits - [facility] Home Health Nurse may visit PRN to address patient s wound care needs. FACE TO FACE ENCOUNTER: MEDICARE and MEDICAID PATIENTS: I certify that this patient is under my care and that I had a face-to-face encounter that meets the physician face-to-face encounter requirements with this patient on this date. The encounter with the patient was in whole or in part for the following MEDICAL CONDITION: (primary reason for Home Healthcare) MEDICAL NECESSITY: I certify, that based on my findings, NURSING services are a medically necessary home health service. HOME BOUND STATUS: I  certify that my clinical findings support that this patient is homebound (i.e., Due to illness or injury, pt requires aid of supportive devices such as crutches, cane, wheelchairs, walkers, the use of special transportation or the assistance of another person to leave their place of residence. There is a normal inability to leave the home and doing so requires considerable and taxing effort. Other absences are for medical reasons / religious services and are infrequent or of short  duration when for other reasons). If current dressing causes regression in wound condition, may D/C ordered dressing product/s and apply Normal Saline Moist Dressing daily until next Wound Healing Center / Other MD appointment. Notify Wound Healing Center of regression in wound condition at [phone]. Please direct any NON-WOUND related issues/requests for orders to patient's Primary Care Physician Wound #2 Left,Lateral Lower Leg: Continue Home Health Visits - [facility] Nurse may visit PRN to address patient s wound care needs. FACE TO FACE ENCOUNTER: MEDICARE and MEDICAID PATIENTS: I certify that this patient is under my care and that I had a face-to-face encounter that meets the physician face-to-face encounter requirements with this patient on this date. The encounter with the patient was in whole or in part for the following MEDICAL CONDITION: (primary reason for Home Healthcare) MEDICAL NECESSITY: I certify, that based on my findings, NURSING services are a medically necessary home health service. HOME BOUND STATUS: I certify that my clinical findings support that this patient is homebound (i.e., Due to illness or injury, pt requires aid of supportive devices such as crutches, cane, wheelchairs, walkers, the use of special transportation or the assistance of another person to leave their place of residence. There is a normal inability to leave the home and doing so requires considerable and taxing  effort. Other absences are for medical reasons / religious services and are infrequent or of short duration when for other reasons). If current dressing causes regression in wound condition, may D/C ordered dressing product/s and apply Normal Saline Cottrell, Iantha FallenKENNETH ([id number]) Moist Dressing daily until next Wound Healing Center / Other MD appointment. Notify Wound Healing Center of regression in wound condition at [phone]. Please direct any NON-WOUND related issues/requests for orders to patient's Primary Care Physician 1. Continue using silver collagen dressing 2. Continue using Unna boot 3. TCA to periwound 4. Return to clinic next 2 weeks Electronic Signature(s) Signed: 02/22/2019 2:59:23 PM By: Cassandria AngerMadduri, Kemo Spruce MD, MBA Previous Signature: 02/13/2019 4:36:31 PM Version By: Cassandria AngerMadduri, Taiquan Campanaro MD, MBA Entered By: Cassandria AngerMadduri, Gergory Biello on 02/22/2019 14:59:22 [name] MajesticHUBBARD, Kedarius ([id number]) -------------------------------------------------------------------------------- SuperBill Details Patient Name: [name] MajesticHUBBARD, [name] [name] Date of Service: 02/13/2019 Medical Record Number: [id number] Patient Account Number: [id number] Date of Birth/Sex: February 13, 1961 (58 y.o. M) Treating RN: Curtis Sitesorthy, Joanna Primary Care Provider: Olevia PerchesJOHNSON, MEGAN Other Clinician: Referring Provider: Olevia PerchesJOHNSON, MEGAN Treating Provider/Extender: Maryla MorrowMadduri, Aayush Gelpi Weeks in Treatment: 3 Diagnosis Coding ICD-10 Codes Code Description I87.2 Venous insufficiency (chronic) (peripheral) L97.822 Non-pressure chronic ulcer of other part of left lower leg with fat layer exposed L97.812 Non-pressure chronic ulcer of other part of right lower leg with fat layer exposed I10 Essential (primary) hypertension J44.9 Chronic obstructive pulmonary disease, unspecified Z79.01 Long term (current) use of anticoagulants I48.0 Paroxysmal atrial fibrillation Z86.711 Personal history of pulmonary embolism I50.42 Chronic combined systolic (congestive) and diastolic  (congestive) heart failure Facility Procedures CPT4 Code Description: [id number] 11042 - DEB SUBQ TISSUE 20 SQ CM/< ICD-10 Diagnosis Description L97.822 Non-pressure chronic ulcer of other part of left lower leg with Modifier: fat layer expos Quantity: 1 ed CPT4 Code Description: [id number] 11045 - DEB SUBQ TISS EA ADDL 20CM ICD-10 Diagnosis Description L97.812 Non-pressure chronic ulcer of other part of right lower leg wit Modifier: h fat layer expo Quantity: 5 sed Physician Procedures CPT4 Code Description: [id number] 11042 - WC PHYS SUBQ TISS 20 SQ CM ICD-10 Diagnosis Description L97.822 Non-pressure chronic ulcer of other part of left lower leg with f Modifier: at layer expos Quantity: 1 ed CPT4 Code Description: [id number] 11045 - WC  PHYS SUBQ TISS EA ADDL 20 CM ICD-10 Diagnosis Description L97.812 Non-pressure chronic ulcer of other part of right lower leg with Modifier: fat layer expo Quantity: 5 sed Electronic Signature(s) Signed: 02/13/2019 4:36:51 PM By: Cassandria Anger MD, MBA Entered By: Cassandria Anger on 02/13/2019 16:36:51

## 2019-02-14 DIAGNOSIS — I872 Venous insufficiency (chronic) (peripheral): Secondary | ICD-10-CM | POA: Diagnosis not present

## 2019-02-14 DIAGNOSIS — S069X9S Unspecified intracranial injury with loss of consciousness of unspecified duration, sequela: Secondary | ICD-10-CM | POA: Diagnosis not present

## 2019-02-15 DIAGNOSIS — S069X9S Unspecified intracranial injury with loss of consciousness of unspecified duration, sequela: Secondary | ICD-10-CM | POA: Diagnosis not present

## 2019-02-16 DIAGNOSIS — I872 Venous insufficiency (chronic) (peripheral): Secondary | ICD-10-CM | POA: Diagnosis not present

## 2019-02-16 DIAGNOSIS — S069X9S Unspecified intracranial injury with loss of consciousness of unspecified duration, sequela: Secondary | ICD-10-CM | POA: Diagnosis not present

## 2019-02-16 DIAGNOSIS — G4733 Obstructive sleep apnea (adult) (pediatric): Secondary | ICD-10-CM | POA: Diagnosis not present

## 2019-02-17 DIAGNOSIS — S069X9S Unspecified intracranial injury with loss of consciousness of unspecified duration, sequela: Secondary | ICD-10-CM | POA: Diagnosis not present

## 2019-02-18 DIAGNOSIS — S069X9S Unspecified intracranial injury with loss of consciousness of unspecified duration, sequela: Secondary | ICD-10-CM | POA: Diagnosis not present

## 2019-02-19 ENCOUNTER — Telehealth: Payer: Self-pay | Admitting: Physician Assistant

## 2019-02-19 DIAGNOSIS — I872 Venous insufficiency (chronic) (peripheral): Secondary | ICD-10-CM | POA: Diagnosis not present

## 2019-02-19 DIAGNOSIS — S069X9S Unspecified intracranial injury with loss of consciousness of unspecified duration, sequela: Secondary | ICD-10-CM | POA: Diagnosis not present

## 2019-02-19 NOTE — Telephone Encounter (Signed)
Virtual Visit Pre-Appointment Phone Call  "(Name), I am calling you today to discuss your upcoming appointment. We are currently trying to limit exposure to the virus that causes COVID-19 by seeing patients at home rather than in the office."  1. "What is the BEST phone number to call the day of the visit?" - include this in appointment notes  2. "Do you have or have access to (through a family member/friend) a smartphone with video capability that we can use for your visit?" a. If yes - list this number in appt notes as "cell" (if different from BEST phone #) and list the appointment type as a VIDEO visit in appointment notes b. If no - list the appointment type as a PHONE visit in appointment notes  3. Confirm consent - "In the setting of the current Covid19 crisis, you are scheduled for a (phone or video) visit with your provider on (date) at (time).  Just as we do with many in-office visits, in order for you to participate in this visit, we must obtain consent.  If you'd like, I can send this to your mychart (if signed up) or email for you to review.  Otherwise, I can obtain your verbal consent now.  All virtual visits are billed to your insurance company just like a normal visit would be.  By agreeing to a virtual visit, we'd like you to understand that the technology does not allow for your provider to perform an examination, and thus may limit your provider's ability to fully assess your condition. If your provider identifies any concerns that need to be evaluated in person, we will make arrangements to do so.  Finally, though the technology is pretty good, we cannot assure that it will always work on either your or our end, and in the setting of a video visit, we may have to convert it to a phone-only visit.  In either situation, we cannot ensure that we have a secure connection.  Are you willing to proceed?" STAFF: Did the patient verbally acknowledge consent to telehealth visit? Document  YES/NO here: YES  4. Advise patient to be prepared - "Two hours prior to your appointment, go ahead and check your blood pressure, pulse, oxygen saturation, and your weight (if you have the equipment to check those) and write them all down. When your visit starts, your provider will ask you for this information. If you have an Apple Watch or Kardia device, please plan to have heart rate information ready on the day of your appointment. Please have a pen and paper handy nearby the day of the visit as well."  5. Give patient instructions for MyChart download to smartphone OR Doximity/Doxy.me as below if video visit (depending on what platform provider is using)  6. Inform patient they will receive a phone call 15 minutes prior to their appointment time (may be from unknown caller ID) so they should be prepared to answer    TELEPHONE CALL NOTE  Algenis Ballin has been deemed a candidate for a follow-up tele-health visit to limit community exposure during the Covid-19 pandemic. I spoke with the patient via phone to ensure availability of phone/video source, confirm preferred email & phone number, and discuss instructions and expectations.  I reminded Gordie Crumby to be prepared with any vital sign and/or heart rhythm information that could potentially be obtained via home monitoring, at the time of his visit. I reminded Samil Mecham to expect a phone call prior to his visit.  Sandre Kitty 02/19/2019 11:54 AM   INSTRUCTIONS FOR DOWNLOADING THE MYCHART APP TO SMARTPHONE  - The patient must first make sure to have activated MyChart and know their login information - If Apple, go to [employer] and type in MyChart in the search bar and download the app. If Android, ask patient to go to [employer] and type in [place] in the search bar and download the app. The app is free but as with any other app downloads, their phone may require them to verify saved payment information or Apple/Android  password.  - The patient will need to then log into the app with their MyChart username and password, and select [facility] as their healthcare provider to link the account. When it is time for your visit, go to the MyChart app, find appointments, and click Begin Video Visit. Be sure to Select Allow for your device to access the Microphone and Camera for your visit. You will then be connected, and your provider will be with you shortly.  **If they have any issues connecting, or need assistance please contact MyChart service desk [phone])**  **If using a computer, in order to ensure the best quality for their visit they will need to use either of the following Internet Browsers: [employer] or Google Chrome**  IF USING DOXIMITY or DOXY.ME - The patient will receive a link just prior to their visit by text.     FULL LENGTH CONSENT FOR TELE-HEALTH VISIT   I hereby voluntarily request, consent and authorize [facility] and its employed or contracted physicians, physician assistants, nurse practitioners or other licensed health care professionals (the Practitioner), to provide me with telemedicine health care services (the "Services") as deemed necessary by the treating Practitioner. I acknowledge and consent to receive the Services by the Practitioner via telemedicine. I understand that the telemedicine visit will involve communicating with the Practitioner through live audiovisual communication technology and the disclosure of certain medical information by electronic transmission. I acknowledge that I have been given the opportunity to request an in-person assessment or other available alternative prior to the telemedicine visit and am voluntarily participating in the telemedicine visit.  I understand that I have the right to withhold or withdraw my consent to the use of telemedicine in the course of my care at any time, without affecting my right to future care or treatment,  and that the Practitioner or I may terminate the telemedicine visit at any time. I understand that I have the right to inspect all information obtained and/or recorded in the course of the telemedicine visit and may receive copies of available information for a reasonable fee.  I understand that some of the potential risks of receiving the Services via telemedicine include:  [name] Delay or interruption in medical evaluation due to technological equipment failure or disruption; . Information transmitted may not be sufficient (e.g. poor resolution of images) to allow for appropriate medical decision making by the Practitioner; and/or  . In rare instances, security protocols could fail, causing a breach of personal health information.  Furthermore, I acknowledge that it is my responsibility to provide information about my medical history, conditions and care that is complete and accurate to the best of my ability. I acknowledge that Practitioner's advice, recommendations, and/or decision may be based on factors not within their control, such as incomplete or inaccurate data provided by me or distortions of diagnostic images or specimens that may result from electronic transmissions. I understand that the practice  of medicine is not an Chief Strategy Officer and that Practitioner makes no warranties or guarantees regarding treatment outcomes. I acknowledge that I will receive a copy of this consent concurrently upon execution via email to the email address I last provided but may also request a printed copy by calling the office of [place].    I understand that my insurance will be billed for this visit.   I have read or had this consent read to me. . I understand the contents of this consent, which adequately explains the benefits and risks of the Services being provided via telemedicine.  . I have been provided ample opportunity to ask questions regarding this consent and the Services and have had my questions  answered to my satisfaction. . I give my informed consent for the services to be provided through the use of telemedicine in my medical care  By participating in this telemedicine visit I agree to the above.

## 2019-02-20 DIAGNOSIS — S069X9S Unspecified intracranial injury with loss of consciousness of unspecified duration, sequela: Secondary | ICD-10-CM | POA: Diagnosis not present

## 2019-02-21 DIAGNOSIS — S069X9S Unspecified intracranial injury with loss of consciousness of unspecified duration, sequela: Secondary | ICD-10-CM | POA: Diagnosis not present

## 2019-02-21 DIAGNOSIS — I872 Venous insufficiency (chronic) (peripheral): Secondary | ICD-10-CM | POA: Diagnosis not present

## 2019-02-22 ENCOUNTER — Telehealth (INDEPENDENT_AMBULATORY_CARE_PROVIDER_SITE_OTHER): Payer: Medicaid Other | Admitting: Physician Assistant

## 2019-02-22 ENCOUNTER — Encounter: Payer: Self-pay | Admitting: Physician Assistant

## 2019-02-22 ENCOUNTER — Telehealth: Payer: Self-pay

## 2019-02-22 ENCOUNTER — Other Ambulatory Visit: Payer: Self-pay

## 2019-02-22 VITALS — BP 120/70 | Ht 68.0 in | Wt >= 6400 oz

## 2019-02-22 DIAGNOSIS — S069X9S Unspecified intracranial injury with loss of consciousness of unspecified duration, sequela: Secondary | ICD-10-CM | POA: Diagnosis not present

## 2019-02-22 DIAGNOSIS — Z7901 Long term (current) use of anticoagulants: Secondary | ICD-10-CM

## 2019-02-22 DIAGNOSIS — E785 Hyperlipidemia, unspecified: Secondary | ICD-10-CM | POA: Diagnosis not present

## 2019-02-22 DIAGNOSIS — I5042 Chronic combined systolic (congestive) and diastolic (congestive) heart failure: Secondary | ICD-10-CM

## 2019-02-22 DIAGNOSIS — Z86711 Personal history of pulmonary embolism: Secondary | ICD-10-CM

## 2019-02-22 DIAGNOSIS — I4819 Other persistent atrial fibrillation: Secondary | ICD-10-CM | POA: Diagnosis not present

## 2019-02-22 DIAGNOSIS — Z86718 Personal history of other venous thrombosis and embolism: Secondary | ICD-10-CM

## 2019-02-22 DIAGNOSIS — I10 Essential (primary) hypertension: Secondary | ICD-10-CM | POA: Diagnosis not present

## 2019-02-22 NOTE — Telephone Encounter (Signed)
Call to [facility] for verbal order for standing fs PT/INR.   Also gave verbal order for BMET.   Per RN call Trey Paula, field RN for ongoing coumadin management @ [phone].  Routing to [place], coumadin RN to make aware.   Provided fax number to send results.

## 2019-02-22 NOTE — Patient Instructions (Addendum)
Medication Instructions:  No changes  *If you need a refill on your cardiac medications before your next appointment, please call your pharmacy*  Lab Work: INR and BMET with [facility], spoke to [employer]. Verbal given.  If you have labs (blood work) drawn today and your tests are completely normal, you will receive your results only by: [name] MyChart Message (if you have MyChart) OR . A paper copy in the mail If you have any lab test that is abnormal or we need to change your treatment, we will call you to review the results.  Testing/Procedures: None ordered   Follow-Up: At [facility], you and your health needs are our priority.  As part of our continuing mission to provide you with exceptional heart care, we have created designated Provider Care Teams.  These Care Teams include your primary Cardiologist (physician) and Advanced Practice Providers (APPs -  Physician Assistants and Nurse Practitioners) who all work together to provide you with the care you need, when you need it.  Your next appointment:   3 week(s)  The format for your next appointment:   In Person  Provider:    You may see [name], MD or [name], PA-C.

## 2019-02-22 NOTE — Progress Notes (Signed)
Virtual Visit via Telephone Note   This visit type was conducted due to national recommendations for restrictions regarding the COVID-19 Pandemic (e.g. social distancing) in an effort to limit this patient's exposure and mitigate transmission in our community.  Due to his co-morbid illnesses, this patient is at least at moderate risk for complications without adequate follow up.  This format is felt to be most appropriate for this patient at this time.  The patient did not have access to video technology/had technical difficulties with video requiring transitioning to audio format only (telephone).  All issues noted in this document were discussed and addressed.  No physical exam could be performed with this format.  Please refer to the patient's chart for his  consent to telehealth for [facility].   Date:  02/22/2019   ID:  [name] [name], DOB Feb 03, 1961, MRN [id number]  Patient Location: Home Provider Location: Office  PCP:  [name], DO  Cardiologist:  Nelva Bush, MD  Electrophysiologist:  None   Evaluation Performed:  Follow-Up Visit  Chief Complaint: Follow-up  History of Present Illness:    [name] [name] is a 59 y.o. male with HFrEF secondary to NICM with patient reporting clean cardiac caths, most recently 1 year prior in [place], [place], persistent A. fib on amiodarone, HTN, COPD, DVT/PE on Coumadin, poorly controlled hypothyroidism, morbid obesity, chronic lower extremity edema with cellulitis, and OSA who presents for hospital follow-up from recent admission 12/6 through 12/12 for lower extremity cellulitis and volume overload.  Patient has a history of nonischemic cardiomyopathy with an EF of 25 to 30% in 03/2018 with reportedly normal coronary arteries by cath in 2019.  He was seen in clinic in early 01/2019 at which time he was volume overloaded with a weight of 169 kg leading to titration of torsemide to 40 mg twice daily with plans for early follow-up.   However prior to this, he was admitted 4 days later with reproducible left-sided chest tenderness and pain with worsening lower extremity edema and erythema despite oral antibiotics.  He was treated with IV antibiotics for cellulitis.  High-sensitivity troponin was mildly elevated with a flat trend peaking at 32.  He was diuresed with IV Lasix with a discharge weight of 408 pounds with a reported dry weight of approximately 375 to 380 pounds.  He was discharged on increased dose of torsemide of 60 mg twice daily.  Patient is doing reasonably well at time of virtual visit today.  He indicates his weight was approximately 417 pounds by his home scale upon his most recent discharge from the hospital with a current weight of 400 pounds today.  He notes his lower extremity swelling and erythema is significantly improved.  He continues to be significantly limited secondary to hip pain which needs to be surgically corrected per his report.  He is also having a gout flare of the left wrist.  He is tolerating all cardiac medications without issues.  He notes vigorous urine response with higher dose of torsemide.  He reports he has fired his PCP and currently has no plan lined up for monitoring his INR.  He does have home health RN through [facility] coming out 3 days/week for wound care and states she has told him she would be able to draw blood if needed and to check his INR.  Otherwise, he does not have any issues or concerns at this time.   Labs independently reviewed: 01/27/2019 - INR 2.1 01/2019 - potassium 4.1, BUN 28, serum  creatinine 1.16, Hgb 10.1, PLT 247, magnesium 1.8, TSH 13.657, free T3 normal, A1c 6.3, TC 153, TG 125, HDL 48, LDL 80  The patient does not have symptoms concerning for COVID-19 infection (fever, chills, cough, or new shortness of breath).    Past Medical History:  Diagnosis Date  . Asthma   . Brain damage   . Chronic pain of both knees 07/13/2018  . COPD (chronic obstructive  pulmonary disease) (HCC)   . Depression   . HFrEF (heart failure with reduced ejection fraction) (HCC)    a. 03/2018 Echo: EF 25-30%, diff HK. Mod dil LA.  [name] History of DVT (deep vein thrombosis)   . History of pulmonary embolism    a. Chronic coumadin.  [name] Hypertension   . MI (myocardial infarction) (HCC)   . Morbid obesity (HCC)   . Neck pain 07/13/2018  . NICM (nonischemic cardiomyopathy) (HCC)    a. s/p Cath x 3 - reportedly nl cors. Last cath 2019 in [place].  [name] Persistent atrial fibrillation (HCC)    a. 03/2018 s/p DCCV; b. CHA2DS2VASc = 1-->Xarelto; c. 05/2018 recurrent afib-->Amio initiated;   . Sleep apnea    Past Surgical History:  Procedure Laterality Date  . CARDIOVERSION N/A 03/24/2018   Procedure: CARDIOVERSION;  Surgeon: Antonieta Iba, MD;  Location: [facility] ORS;  Service: Cardiovascular;  Laterality: N/A;  . CARDIOVERSION N/A 08/08/2018   Procedure: CARDIOVERSION;  Surgeon: [name], MD;  Location: [facility] ORS;  Service: Cardiovascular;  Laterality: N/A;  . hearnia repair     X 3- total of two surgeries  . LEG SURGERY       Current Meds  Medication Sig  . albuterol (VENTOLIN HFA) 108 (90 Base) MCG/ACT inhaler Inhale 2 puffs into the lungs every 6 (six) hours as needed for wheezing or shortness of breath.  . allopurinol (ZYLOPRIM) 100 MG tablet TAKE 1 TABLET BY MOUTH ONCE DAILY (Patient taking differently: Take 100 mg by mouth daily. )  . amiodarone (PACERONE) 200 MG tablet Take 1 tablet (200 mg total) by mouth daily.  [name] atorvastatin (LIPITOR) 40 MG tablet TAKE 1 TABLET BY MOUTH ONCE DAILY (Patient taking differently: Take 40 mg by mouth daily. )  . DULoxetine (CYMBALTA) 20 MG capsule Take 20 mg by mouth daily.  . ferrous sulfate 325 (65 FE) MG tablet Take 1 tablet (325 mg total) by mouth 2 (two) times daily with a meal.  . levothyroxine (SYNTHROID) 50 MCG tablet Take 1 tablet (50 mcg total) by mouth daily before breakfast.  . lisinopril (ZESTRIL) 2.5 MG tablet TAKE 1  TABLET BY MOUTH ONCE DAILY (Patient taking differently: Take 2.5 mg by mouth daily. )  . metoprolol succinate (TOPROL-XL) 50 MG 24 hr tablet Take 50 mg by mouth 2 (two) times daily. Take with or immediately following a meal.  . nitroGLYCERIN (NITROSTAT) 0.4 MG SL tablet Place 1 tablet (0.4 mg total) under the tongue every 5 (five) minutes as needed for chest pain.  [name] nystatin-triamcinolone ointment (MYCOLOG) Apply 1 application topically 2 (two) times daily.  [name] spironolactone (ALDACTONE) 25 MG tablet TAKE 1 TABLET BY MOUTH ONCE DAILY (Patient taking differently: Take 25 mg by mouth daily. )  . torsemide (DEMADEX) 20 MG tablet Take 3 tablets (60 mg total) by mouth 2 (two) times daily.  . traZODone (DESYREL) 150 MG tablet TAKE 1 TABLET BY MOUTH AT BEDTIME AS NEEDED SLEEP (Patient taking differently: Take 150 mg by mouth at bedtime. )  . warfarin (COUMADIN) 5 MG  tablet Take 3 tablets (15 mg total) by mouth daily. (Patient taking differently: Take 10 mg by mouth daily. )     Allergies:   Patient has no known allergies.   Social History   Tobacco Use  . Smoking status: Never Smoker  . Smokeless tobacco: Never Used  Substance Use Topics  . Alcohol use: Never  . Drug use: Never     Family Hx: The patient's family history includes Heart attack in his maternal grandfather and maternal grandmother; Heart failure in his mother; Lung cancer in his father and mother.  ROS:   Please see the history of present illness.     All other systems reviewed and are negative.   Prior CV studies:   The following studies were reviewed today:  2D echo 03/24/2018: 1. The left ventricle has severely reduced systolic function of 25-30%. The cavity size was mildly increased. There is no increased left ventricular wall thickness. Left ventricular diastology could not be evaluated due to nondiagnostic images. Left  ventricular diffuse hypokinesis.  2. Left atrial size was moderately dilated.  3. Not assessed.   4. The mitral valve was not assessed. There is mild thickening and mild calcification. Mitral valve regurgitation was not significant.  5. The tricuspid valve is not assessed. Tricuspid valve regurgitation was not assessed by color flow Doppler.  6. The aortic valve was not assessed.  7. The interatrial septum was not well visualized.  Labs/Other Tests and Data Reviewed:    EKG:  No ECG reviewed.  Recent Labs: 10/23/2018: B Natriuretic Peptide 157.0 01/05/2019: ALT 14 01/22/2019: TSH 13.657 01/25/2019: Hemoglobin 10.1; Magnesium 1.8; Platelets 247 01/27/2019: BUN 28; Creatinine, Ser 1.16; Potassium 4.1; Sodium 135   Recent Lipid Panel Lab Results  Component Value Date/Time   CHOL 153 01/22/2019 03:46 AM   CHOL 159 01/05/2019 04:45 PM   TRIG 125 01/22/2019 03:46 AM   HDL 48 01/22/2019 03:46 AM   HDL 66 01/05/2019 04:45 PM   CHOLHDL 3.2 01/22/2019 03:46 AM   LDLCALC 80 01/22/2019 03:46 AM   LDLCALC 73 01/05/2019 04:45 PM    Wt Readings from Last 3 Encounters:  02/22/19 (!) 400 lb (181.4 kg)  01/27/19 (!) 408 lb 11.7 oz (185.4 kg)  01/17/19 (!) 374 lb (169.6 kg)     Objective:    Vital Signs:  BP 120/70 (BP Location: Left Arm, Patient Position: Sitting)   Ht 5\' 8"  (1.727 m)   Wt (!) 400 lb (181.4 kg)   BMI 60.82 kg/m    VITAL SIGNS:  reviewed  ASSESSMENT & PLAN:    1. Chronic combined systolic and diastolic CHF: Patient's home weight remains 20 to 25 pounds above dry weight though has improved significantly since his hospital discharge.  For now, continue higher dose torsemide 60 mg twice daily along with spironolactone, lisinopril, and Toprol-XL.  We have placed an order for home health RN to draw a BMP and fax the results to evaluate his renal function and potassium while undergoing outpatient diuresis.  He reports a cath which demonstrated normal coronary arteries approximately 1 year prior.  When patient is able to be seen in the office setting with easily obtained  follow-up labs consider transitioning from lisinopril to [facility] to further optimize GDMT.  Again, when he he feels he can come in for an in person visit would recommend scheduling follow-up echo to evaluate for any improvement in his LV systolic function on GDMT.  If EF remains less than 35% at  that time consider referral to EP for consideration of ICD.  CHF education.  2. Persistent A. Fib: No symptoms concerning for tachypalpitations.  On Coumadin as outlined below.  Will refer to Coumadin clinic with the assistance of home health to obtain INRs as outlined below.  Continue metoprolol for rate control.  Check CBC as below.  3. 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  No INR checks since his hospital discharge on 01/27/2019.  Patient is agreeable to establishing with our Coumadin clinic and having home health obtain PT/INR with faxing results to our Coumadin clinic for review.  I will send a message to Coumadin clinic to get him established.  He will need a follow-up CBC as outlined below.  4. HTN: Blood pressure is well controlled.  Continue current medications as outlined above.  5. HLD: Remains on atorvastatin with LDL of 80 from 01/2019 and reportedly normal coronary arteries on cath approximately 1 year prior.  6. Normocytic anemia: Hemoglobin around 13 and 11/2018 with most recent value of 10.1.  Have home health check CBC in the setting of chronic Coumadin usage with prior supratherapeutic INR.  COVID-19 Education: The signs and symptoms of COVID-19 were discussed with the patient and how to seek care for testing (follow up with PCP or arrange E-visit).  The importance of social distancing was discussed today.  Time:   Today, I have spent 20 minutes with the patient with telehealth technology discussing the above problems.     Medication Adjustments/Labs and Tests  Ordered: Current medicines are reviewed at length with the patient today.  Concerns regarding medicines are outlined above.   Tests Ordered: No orders of the defined types were placed in this encounter.   Medication Changes: No orders of the defined types were placed in this encounter.   Follow Up:  Virtual Visit  in 2 week(s)  Signed, [name], PA-C  02/22/2019 3:07 PM    [facility]

## 2019-02-22 NOTE — Telephone Encounter (Signed)
Called the Home Health Nurse Trey Paula to see what day the order was given to have the INR checked since the date was not noted. Trey Paula stated he was told to go tomorrow and have an INR drawn. He is aware since he is doing a POC INR to call to [facility] location at [phone] and if we don't answer to leave a message and allow [place] sometime to call back since we have a busy clinic and see patients and monitor the phones throughout the day and he verbalized understanding.  Trey Paula stated they see the pt twice a week on Mondays and Wednesdays normally. Therefore, he hopes to get the orders to check the pt on those days and advised we will do our best and it will depend on the INR tomorrow. In the future, he is aware the INR will go to [facility] on Mondays and Wednesdays.

## 2019-02-23 ENCOUNTER — Ambulatory Visit (INDEPENDENT_AMBULATORY_CARE_PROVIDER_SITE_OTHER): Payer: Medicaid Other | Admitting: Pharmacist

## 2019-02-23 ENCOUNTER — Encounter (INDEPENDENT_AMBULATORY_CARE_PROVIDER_SITE_OTHER): Payer: Medicaid Other

## 2019-02-23 ENCOUNTER — Other Ambulatory Visit: Payer: Self-pay | Admitting: Physician Assistant

## 2019-02-23 ENCOUNTER — Other Ambulatory Visit: Payer: Self-pay | Admitting: Family Medicine

## 2019-02-23 DIAGNOSIS — I482 Chronic atrial fibrillation, unspecified: Secondary | ICD-10-CM | POA: Diagnosis not present

## 2019-02-23 DIAGNOSIS — Z7901 Long term (current) use of anticoagulants: Secondary | ICD-10-CM | POA: Diagnosis not present

## 2019-02-23 DIAGNOSIS — I872 Venous insufficiency (chronic) (peripheral): Secondary | ICD-10-CM | POA: Diagnosis not present

## 2019-02-23 DIAGNOSIS — Z86718 Personal history of other venous thrombosis and embolism: Secondary | ICD-10-CM

## 2019-02-23 DIAGNOSIS — S069X9S Unspecified intracranial injury with loss of consciousness of unspecified duration, sequela: Secondary | ICD-10-CM | POA: Diagnosis not present

## 2019-02-23 DIAGNOSIS — Z5181 Encounter for therapeutic drug level monitoring: Secondary | ICD-10-CM

## 2019-02-23 DIAGNOSIS — I110 Hypertensive heart disease with heart failure: Secondary | ICD-10-CM | POA: Diagnosis not present

## 2019-02-23 DIAGNOSIS — I2699 Other pulmonary embolism without acute cor pulmonale: Secondary | ICD-10-CM

## 2019-02-23 LAB — POCT INR: INR: 2.8 (ref 2.0–3.0)

## 2019-02-23 MED ORDER — WARFARIN SODIUM 5 MG PO TABS: ORAL_TABLET | ORAL | 0 refills | Status: DC

## 2019-02-23 NOTE — Addendum Note (Signed)
Addended by: Mellody Dance B on: 02/23/2019 11:53 AM   Modules accepted: Orders

## 2019-02-23 NOTE — Telephone Encounter (Signed)
°*  STAT* If patient is at the pharmacy, call can be transferred to refill team.   1. Which medications need to be refilled? (please list name of each medication and dose if known) warfarin 5mg  po as directed   2. Which pharmacy/location (including street and city if local pharmacy) is medication to be sent to? [facility]   3. Do they need a 30 day or 90 day supply? 90  Patient also wants to make sure ordered Home Health to assist with order for [facility]

## 2019-02-24 DIAGNOSIS — S069X9S Unspecified intracranial injury with loss of consciousness of unspecified duration, sequela: Secondary | ICD-10-CM | POA: Diagnosis not present

## 2019-02-25 DIAGNOSIS — S069X9S Unspecified intracranial injury with loss of consciousness of unspecified duration, sequela: Secondary | ICD-10-CM | POA: Diagnosis not present

## 2019-02-26 DIAGNOSIS — S069X9S Unspecified intracranial injury with loss of consciousness of unspecified duration, sequela: Secondary | ICD-10-CM | POA: Diagnosis not present

## 2019-02-26 DIAGNOSIS — I872 Venous insufficiency (chronic) (peripheral): Secondary | ICD-10-CM | POA: Diagnosis not present

## 2019-02-27 ENCOUNTER — Ambulatory Visit: Payer: Medicaid Other | Admitting: Physician Assistant

## 2019-02-27 DIAGNOSIS — S069X9S Unspecified intracranial injury with loss of consciousness of unspecified duration, sequela: Secondary | ICD-10-CM | POA: Diagnosis not present

## 2019-02-28 ENCOUNTER — Telehealth: Payer: Self-pay | Admitting: Internal Medicine

## 2019-02-28 ENCOUNTER — Ambulatory Visit (INDEPENDENT_AMBULATORY_CARE_PROVIDER_SITE_OTHER): Payer: Medicaid Other

## 2019-02-28 DIAGNOSIS — I482 Chronic atrial fibrillation, unspecified: Secondary | ICD-10-CM

## 2019-02-28 DIAGNOSIS — I2699 Other pulmonary embolism without acute cor pulmonale: Secondary | ICD-10-CM | POA: Diagnosis not present

## 2019-02-28 DIAGNOSIS — S069X9S Unspecified intracranial injury with loss of consciousness of unspecified duration, sequela: Secondary | ICD-10-CM | POA: Diagnosis not present

## 2019-02-28 DIAGNOSIS — Z86718 Personal history of other venous thrombosis and embolism: Secondary | ICD-10-CM

## 2019-02-28 DIAGNOSIS — Z7901 Long term (current) use of anticoagulants: Secondary | ICD-10-CM | POA: Diagnosis not present

## 2019-02-28 DIAGNOSIS — I872 Venous insufficiency (chronic) (peripheral): Secondary | ICD-10-CM | POA: Diagnosis not present

## 2019-02-28 LAB — POCT INR: INR: 1.7 — AB (ref 2.0–3.0)

## 2019-02-28 NOTE — Telephone Encounter (Signed)
Result is reported as 1.7

## 2019-02-28 NOTE — Telephone Encounter (Signed)
Please see anti-coag note for today. 

## 2019-02-28 NOTE — Patient Instructions (Signed)
Spoke with Misty Stanley, [facility] RN and advised her to have patient take 2 tablets today, then resume dosage 1 tablet daily except 2 tablets on Tuesday, Thursday and Saturday.  Recheck INR on 03/07/2019.

## 2019-02-28 NOTE — Telephone Encounter (Signed)
Home Health calling with pt inr results.

## 2019-03-01 ENCOUNTER — Other Ambulatory Visit: Payer: Self-pay

## 2019-03-01 ENCOUNTER — Encounter: Payer: Self-pay | Admitting: Adult Health

## 2019-03-01 ENCOUNTER — Ambulatory Visit: Payer: Self-pay | Admitting: Pharmacist

## 2019-03-01 ENCOUNTER — Ambulatory Visit (INDEPENDENT_AMBULATORY_CARE_PROVIDER_SITE_OTHER): Payer: Medicaid Other | Admitting: Adult Health

## 2019-03-01 VITALS — BP 130/70 | HR 73 | Temp 97.1°F | Resp 18

## 2019-03-01 DIAGNOSIS — M25551 Pain in right hip: Secondary | ICD-10-CM

## 2019-03-01 DIAGNOSIS — Z1329 Encounter for screening for other suspected endocrine disorder: Secondary | ICD-10-CM | POA: Diagnosis not present

## 2019-03-01 DIAGNOSIS — Z0000 Encounter for general adult medical examination without abnormal findings: Secondary | ICD-10-CM | POA: Diagnosis not present

## 2019-03-01 DIAGNOSIS — I5022 Chronic systolic (congestive) heart failure: Secondary | ICD-10-CM | POA: Diagnosis not present

## 2019-03-01 DIAGNOSIS — Z86711 Personal history of pulmonary embolism: Secondary | ICD-10-CM | POA: Diagnosis not present

## 2019-03-01 DIAGNOSIS — L03119 Cellulitis of unspecified part of limb: Secondary | ICD-10-CM | POA: Diagnosis not present

## 2019-03-01 DIAGNOSIS — Z13220 Encounter for screening for lipoid disorders: Secondary | ICD-10-CM

## 2019-03-01 DIAGNOSIS — S069X9S Unspecified intracranial injury with loss of consciousness of unspecified duration, sequela: Secondary | ICD-10-CM | POA: Diagnosis not present

## 2019-03-01 NOTE — Progress Notes (Deleted)
       Patient: [name] [name] Male    DOB: 1960-12-04   59 y.o.   MRN: [id number] Visit Date: 03/01/2019  Today's Provider: Jairo Ben, FNP   Chief Complaint  Patient presents with  . Transitions Of Care   Subjective:     HPI  No Known Allergies   Current Outpatient Medications:  .  albuterol (VENTOLIN HFA) 108 (90 Base) MCG/ACT inhaler, Inhale 2 puffs into the lungs every 6 (six) hours as needed for wheezing or shortness of breath., Disp: 1 Inhaler, Rfl: 3 .  allopurinol (ZYLOPRIM) 100 MG tablet, TAKE 1 TABLET BY MOUTH ONCE DAILY (Patient taking differently: Take 100 mg by mouth daily. ), Disp: 30 tablet, Rfl: 0 .  amiodarone (PACERONE) 200 MG tablet, Take 1 tablet (200 mg total) by mouth daily., Disp: 30 tablet, Rfl: 0 .  atorvastatin (LIPITOR) 40 MG tablet, TAKE 1 TABLET BY MOUTH ONCE DAILY (Patient taking differently: Take 40 mg by mouth daily. ), Disp: 30 tablet, Rfl: 2 .  DULoxetine (CYMBALTA) 20 MG capsule, Take 20 mg by mouth daily., Disp: , Rfl:  .  ferrous sulfate 325 (65 FE) MG tablet, Take 1 tablet (325 mg total) by mouth 2 (two) times daily with a meal., Disp: 60 tablet, Rfl: 3 .  levothyroxine (SYNTHROID) 50 MCG tablet, Take 1 tablet (50 mcg total) by mouth daily before breakfast., Disp: 30 tablet, Rfl: 1 .  lisinopril (ZESTRIL) 2.5 MG tablet, TAKE 1 TABLET BY MOUTH ONCE DAILY (Patient taking differently: Take 2.5 mg by mouth daily. ), Disp: 30 tablet, Rfl: 0 .  metoprolol succinate (TOPROL-XL) 50 MG 24 hr tablet, Take 50 mg by mouth 2 (two) times daily. Take with or immediately following a meal., Disp: , Rfl:  .  nitroGLYCERIN (NITROSTAT) 0.4 MG SL tablet, Place 1 tablet (0.4 mg total) under the tongue every 5 (five) minutes as needed for chest pain., Disp: 15 tablet, Rfl: 12 .  nystatin-triamcinolone ointment (MYCOLOG), Apply 1 application topically 2 (two) times daily., Disp: 30 g, Rfl: 0 .  spironolactone (ALDACTONE) 25 MG tablet, TAKE 1 TABLET BY MOUTH  ONCE DAILY (Patient taking differently: Take 25 mg by mouth daily. ), Disp: 30 tablet, Rfl: 3 .  torsemide (DEMADEX) 20 MG tablet, Take 3 tablets (60 mg total) by mouth 2 (two) times daily., Disp: 180 tablet, Rfl: 2 .  traZODone (DESYREL) 150 MG tablet, TAKE 1 TABLET BY MOUTH AT BEDTIME AS NEEDED SLEEP, Disp: 30 tablet, Rfl: 2 .  warfarin (COUMADIN) 5 MG tablet, Take by mouth daily as directed by the coumadin clinic, Disp: 140 tablet, Rfl: 0  Review of Systems  Social History   Tobacco Use  . Smoking status: Never Smoker  . Smokeless tobacco: Never Used  Substance Use Topics  . Alcohol use: Never      Objective:   There were no vitals taken for this visit. There were no vitals filed for this visit.There is no height or weight on file to calculate BMI.   Physical Exam   No results found for any visits on 03/01/19.     Assessment & Plan        Jairo Ben, FNP  [facility]
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
## 2019-03-01 NOTE — Patient Instructions (Addendum)
[facility]/ [facility]  [facility] diagnostic [facility] center in [place], [place] COVID-19 info: [email or web address] Address: [address] [place], [place], [place] [address] Phone: [phone]   [facility] hours are Monday to Friday 1pm to 7pm. Orthopedic clinic in [place], [place] COVID[place]19 info: [email or web address] Get online care: [email or web address] Address: [address] [place], [place], [place] [address] Hours:  Open ? Closes 7:30PM Phone: [phone]     Health Maintenance, Male Adopting a healthy lifestyle and getting preventive care are important in promoting health and wellness. Ask your health care provider about:  The right schedule for you to have regular tests and exams.  Things you can do on your own to prevent diseases and keep yourself healthy. What should I know about diet, weight, and exercise? Eat a healthy diet   Eat a diet that includes plenty of vegetables, fruits, low-fat dairy products, and lean protein.  Do not eat a lot of foods that are high in solid fats, added sugars, or sodium. Maintain a healthy weight Body mass index (BMI) is a measurement that can be used to identify possible weight problems. It estimates body fat based on height and weight. Your health care provider can help determine your BMI and help you achieve or maintain a healthy weight. Get regular exercise Get regular exercise. This is one of the most important things you can do for your health. Most adults should:  Exercise for at least 150 minutes each week. The exercise should increase your heart rate and make you sweat (moderate-intensity exercise).  Do strengthening exercises at least twice a week. This is in addition to the moderate-intensity exercise.  Spend less time sitting. Even light physical activity can be beneficial. Watch cholesterol and blood lipids Have your blood tested for lipids and cholesterol at 59  years of age, then have this test every 5 years. You may need to have your cholesterol levels checked more often if:  Your lipid or cholesterol levels are high.  You are older than 59 years of age.  You are at high risk for heart disease. What should I know about cancer screening? Many types of cancers can be detected early and may often be prevented. Depending on your health history and family history, you may need to have cancer screening at various ages. This may include screening for:  Colorectal cancer.  Prostate cancer.  Skin cancer.  Lung cancer. What should I know about heart disease, diabetes, and high blood pressure? Blood pressure and heart disease  High blood pressure causes heart disease and increases the risk of stroke. This is more likely to develop in people who have high blood pressure readings, are of African descent, or are overweight.  Talk with your health care provider about your target blood pressure readings.  Have your blood pressure checked: ? Every 3-5 years if you are 63-21 years of age. ? Every year if you are 21 years old or older.  If you are between the ages of 70 and 26 and are a current or former smoker, ask your health care provider if you should have a one-time screening for abdominal aortic aneurysm (AAA). Diabetes Have regular diabetes screenings. This checks your fasting blood sugar level. Have the screening done:  Once every three years after age 32 if you are at a normal weight and have a low risk for diabetes.  More often and at a younger age if you are overweight or have a high risk  for diabetes. What should I know about preventing infection? Hepatitis B If you have a higher risk for hepatitis B, you should be screened for this virus. Talk with your health care provider to find out if you are at risk for hepatitis B infection. Hepatitis C Blood testing is recommended for:  Everyone born from [birth year] through 1965.  Anyone with known  risk factors for hepatitis C. Sexually transmitted infections (STIs)  You should be screened each year for STIs, including gonorrhea and chlamydia, if: ? You are sexually active and are younger than 59 years of age. ? You are older than 59 years of age and your health care provider tells you that you are at risk for this type of infection. ? Your sexual activity has changed since you were last screened, and you are at increased risk for chlamydia or gonorrhea. Ask your health care provider if you are at risk.  Ask your health care provider about whether you are at high risk for HIV. Your health care provider may recommend a prescription medicine to help prevent HIV infection. If you choose to take medicine to prevent HIV, you should first get tested for HIV. You should then be tested every 3 months for as long as you are taking the medicine. Follow these instructions at home: Lifestyle  Do not use any products that contain nicotine or tobacco, such as cigarettes, e-cigarettes, and chewing tobacco. If you need help quitting, ask your health care provider.  Do not use street drugs.  Do not share needles.  Ask your health care provider for help if you need support or information about quitting drugs. Alcohol use  Do not drink alcohol if your health care provider tells you not to drink.  If you drink alcohol: ? Limit how much you have to 0-2 drinks a day. ? Be aware of how much alcohol is in your drink. In the [place] one drink equals one 12 oz bottle of beer (355 mL), one 5 oz glass of wine (148 mL), or one 1 oz glass of hard liquor (44 mL). General instructions  Schedule regular health, dental, and eye exams.  Stay current with your vaccines.  Tell your health care provider if: ? You often feel depressed. ? You have ever been abused or do not feel safe at home. Summary  Adopting a healthy lifestyle and getting preventive care are important in promoting health and wellness.  Follow  your health care provider's instructions about healthy diet, exercising, and getting tested or screened for diseases.  Follow your health care provider's instructions on monitoring your cholesterol and blood pressure. This information is not intended to replace advice given to you by your health care provider. Make sure you discuss any questions you have with your health care provider. Document Revised: 01/25/2018 Document Reviewed: 01/25/2018 Elsevier Patient Education  [place]. Hip Pain The hip is the joint between the upper legs and the lower pelvis. The bones, cartilage, tendons, and muscles of your hip joint support your body and allow you to move around. Hip pain can range from a minor ache to severe pain in one or both of your hips. The pain may be felt on the inside of the hip joint near the groin, or on the outside near the buttocks and upper thigh. You may also have swelling or stiffness in your hip area. Follow these instructions at home: Managing pain, stiffness, and swelling      If directed, put ice on the painful  area. To do this: ? Put ice in a plastic bag. ? Place a towel between your skin and the bag. ? Leave the ice on for 20 minutes, 2-3 times a day.  If directed, apply heat to the affected area as often as told by your health care provider. Use the heat source that your health care provider recommends, such as a moist heat pack or a heating pad. ? Place a towel between your skin and the heat source. ? Leave the heat on for 20-30 minutes. ? Remove the heat if your skin turns bright red. This is especially important if you are unable to feel pain, heat, or cold. You may have a greater risk of getting burned. Activity  Do exercises as told by your health care provider.  Avoid activities that cause pain. General instructions   Take over-the-counter and prescription medicines only as told by your health care provider.  Keep a journal of your symptoms.  Write down: ? How often you have hip pain. ? The location of your pain. ? What the pain feels like. ? What makes the pain worse.  Sleep with a pillow between your legs on your most comfortable side.  Keep all follow-up visits as told by your health care provider. This is important. Contact a health care provider if:  You cannot put weight on your leg.  Your pain or swelling continues or gets worse after one week.  It gets harder to walk.  You have a fever. Get help right away if:  You fall.  You have a sudden increase in pain and swelling in your hip.  Your hip is red or swollen or very tender to touch. Summary  Hip pain can range from a minor ache to severe pain in one or both of your hips.  The pain may be felt on the inside of the hip joint near the groin, or on the outside near the buttocks and upper thigh.  Avoid activities that cause pain.  Write down how often you have hip pain, the location of the pain, what makes it worse, and what it feels like. This information is not intended to replace advice given to you by your health care provider. Make sure you discuss any questions you have with your health care provider. Document Revised: 06/19/2018 Document Reviewed: 06/19/2018 Elsevier Patient Education  2020 [employer].

## 2019-03-02 DIAGNOSIS — S069X9S Unspecified intracranial injury with loss of consciousness of unspecified duration, sequela: Secondary | ICD-10-CM | POA: Diagnosis not present

## 2019-03-02 DIAGNOSIS — I872 Venous insufficiency (chronic) (peripheral): Secondary | ICD-10-CM | POA: Diagnosis not present

## 2019-03-03 DIAGNOSIS — S069X9S Unspecified intracranial injury with loss of consciousness of unspecified duration, sequela: Secondary | ICD-10-CM | POA: Diagnosis not present

## 2019-03-04 ENCOUNTER — Emergency Department: Payer: Medicaid Other

## 2019-03-04 ENCOUNTER — Inpatient Hospital Stay
Admission: EM | Admit: 2019-03-04 | Discharge: 2019-03-05 | DRG: 313 | Disposition: A | Discharge status: Home / Self Care | Payer: Medicaid Other | Attending: Internal Medicine | Admitting: Internal Medicine

## 2019-03-04 ENCOUNTER — Other Ambulatory Visit: Payer: Self-pay

## 2019-03-04 DIAGNOSIS — Z79890 Hormone replacement therapy (postmenopausal): Secondary | ICD-10-CM

## 2019-03-04 DIAGNOSIS — I10 Essential (primary) hypertension: Secondary | ICD-10-CM | POA: Diagnosis not present

## 2019-03-04 DIAGNOSIS — I447 Left bundle-branch block, unspecified: Secondary | ICD-10-CM | POA: Diagnosis present

## 2019-03-04 DIAGNOSIS — M25551 Pain in right hip: Secondary | ICD-10-CM | POA: Diagnosis not present

## 2019-03-04 DIAGNOSIS — I2699 Other pulmonary embolism without acute cor pulmonale: Secondary | ICD-10-CM | POA: Diagnosis present

## 2019-03-04 DIAGNOSIS — Z6841 Body Mass Index (BMI) 40.0 and over, adult: Secondary | ICD-10-CM

## 2019-03-04 DIAGNOSIS — R0789 Other chest pain: Principal | ICD-10-CM | POA: Diagnosis present

## 2019-03-04 DIAGNOSIS — D649 Anemia, unspecified: Secondary | ICD-10-CM | POA: Diagnosis present

## 2019-03-04 DIAGNOSIS — I428 Other cardiomyopathies: Secondary | ICD-10-CM | POA: Diagnosis present

## 2019-03-04 DIAGNOSIS — I482 Chronic atrial fibrillation, unspecified: Secondary | ICD-10-CM | POA: Diagnosis present

## 2019-03-04 DIAGNOSIS — Z86718 Personal history of other venous thrombosis and embolism: Secondary | ICD-10-CM

## 2019-03-04 DIAGNOSIS — R079 Chest pain, unspecified: Secondary | ICD-10-CM | POA: Diagnosis present

## 2019-03-04 DIAGNOSIS — Z7901 Long term (current) use of anticoagulants: Secondary | ICD-10-CM

## 2019-03-04 DIAGNOSIS — I878 Other specified disorders of veins: Secondary | ICD-10-CM | POA: Diagnosis present

## 2019-03-04 DIAGNOSIS — I2510 Atherosclerotic heart disease of native coronary artery without angina pectoris: Secondary | ICD-10-CM | POA: Diagnosis present

## 2019-03-04 DIAGNOSIS — R791 Abnormal coagulation profile: Secondary | ICD-10-CM | POA: Diagnosis present

## 2019-03-04 DIAGNOSIS — I110 Hypertensive heart disease with heart failure: Secondary | ICD-10-CM | POA: Diagnosis present

## 2019-03-04 DIAGNOSIS — I252 Old myocardial infarction: Secondary | ICD-10-CM | POA: Diagnosis not present

## 2019-03-04 DIAGNOSIS — I872 Venous insufficiency (chronic) (peripheral): Secondary | ICD-10-CM | POA: Diagnosis present

## 2019-03-04 DIAGNOSIS — J432 Centrilobular emphysema: Secondary | ICD-10-CM | POA: Diagnosis present

## 2019-03-04 DIAGNOSIS — J410 Simple chronic bronchitis: Secondary | ICD-10-CM

## 2019-03-04 DIAGNOSIS — I5042 Chronic combined systolic (congestive) and diastolic (congestive) heart failure: Secondary | ICD-10-CM | POA: Diagnosis present

## 2019-03-04 DIAGNOSIS — I4819 Other persistent atrial fibrillation: Secondary | ICD-10-CM | POA: Diagnosis present

## 2019-03-04 DIAGNOSIS — R069 Unspecified abnormalities of breathing: Secondary | ICD-10-CM | POA: Diagnosis not present

## 2019-03-04 DIAGNOSIS — I5022 Chronic systolic (congestive) heart failure: Secondary | ICD-10-CM | POA: Diagnosis not present

## 2019-03-04 DIAGNOSIS — Z86711 Personal history of pulmonary embolism: Secondary | ICD-10-CM | POA: Diagnosis not present

## 2019-03-04 DIAGNOSIS — I440 Atrioventricular block, first degree: Secondary | ICD-10-CM | POA: Diagnosis present

## 2019-03-04 DIAGNOSIS — S069X9S Unspecified intracranial injury with loss of consciousness of unspecified duration, sequela: Secondary | ICD-10-CM | POA: Diagnosis not present

## 2019-03-04 DIAGNOSIS — R0602 Shortness of breath: Secondary | ICD-10-CM | POA: Diagnosis not present

## 2019-03-04 DIAGNOSIS — I890 Lymphedema, not elsewhere classified: Secondary | ICD-10-CM

## 2019-03-04 DIAGNOSIS — E039 Hypothyroidism, unspecified: Secondary | ICD-10-CM | POA: Diagnosis not present

## 2019-03-04 DIAGNOSIS — E119 Type 2 diabetes mellitus without complications: Secondary | ICD-10-CM | POA: Diagnosis present

## 2019-03-04 DIAGNOSIS — E785 Hyperlipidemia, unspecified: Secondary | ICD-10-CM | POA: Diagnosis present

## 2019-03-04 DIAGNOSIS — M1611 Unilateral primary osteoarthritis, right hip: Secondary | ICD-10-CM | POA: Diagnosis present

## 2019-03-04 DIAGNOSIS — G4733 Obstructive sleep apnea (adult) (pediatric): Secondary | ICD-10-CM | POA: Diagnosis present

## 2019-03-04 DIAGNOSIS — J449 Chronic obstructive pulmonary disease, unspecified: Secondary | ICD-10-CM | POA: Diagnosis present

## 2019-03-04 DIAGNOSIS — I5033 Acute on chronic diastolic (congestive) heart failure: Secondary | ICD-10-CM | POA: Diagnosis present

## 2019-03-04 LAB — COMPREHENSIVE METABOLIC PANEL
ALT: 13 U/L (ref 0–44)
AST: 22 U/L (ref 15–41)
Albumin: 3 g/dL — ABNORMAL LOW (ref 3.5–5.0)
Alkaline Phosphatase: 68 U/L (ref 38–126)
Anion gap: 9 (ref 5–15)
BUN: 12 mg/dL (ref 6–20)
CO2: 26 mmol/L (ref 22–32)
Calcium: 7.9 mg/dL — ABNORMAL LOW (ref 8.9–10.3)
Chloride: 104 mmol/L (ref 98–111)
Creatinine, Ser: 0.81 mg/dL (ref 0.61–1.24)
GFR calc Af Amer: >60 mL/min (ref >60)
GFR calc non Af Amer: >60 mL/min (ref >60)
Glucose, Bld: 110 mg/dL — ABNORMAL HIGH (ref 70–99)
Potassium: 3.7 mmol/L (ref 3.5–5.1)
Sodium: 139 mmol/L (ref 135–145)
Total Bilirubin: 0.7 mg/dL (ref 0.3–1.2)
Total Protein: 6.4 g/dL — ABNORMAL LOW (ref 6.5–8.1)

## 2019-03-04 LAB — BRAIN NATRIURETIC PEPTIDE: B Natriuretic Peptide: 189 pg/mL — ABNORMAL HIGH (ref 0.0–100.0)

## 2019-03-04 LAB — CBC WITH DIFFERENTIAL/PLATELET
Abs Immature Granulocytes: 0.03 10*3/uL (ref 0.00–0.07)
Basophils Absolute: 0.1 10*3/uL (ref 0.0–0.1)
Basophils Relative: 1 %
Eosinophils Absolute: 0.1 10*3/uL (ref 0.0–0.5)
Eosinophils Relative: 1 %
HCT: 33.4 % — ABNORMAL LOW (ref 39.0–52.0)
Hemoglobin: 10.7 g/dL — ABNORMAL LOW (ref 13.0–17.0)
Immature Granulocytes: 0 %
Lymphocytes Relative: 13 %
Lymphs Abs: 0.9 10*3/uL (ref 0.7–4.0)
MCH: 29 pg (ref 26.0–34.0)
MCHC: 32 g/dL (ref 30.0–36.0)
MCV: 90.5 fL (ref 80.0–100.0)
Monocytes Absolute: 0.6 10*3/uL (ref 0.1–1.0)
Monocytes Relative: 9 %
Neutro Abs: 5.3 10*3/uL (ref 1.7–7.7)
Neutrophils Relative %: 76 %
Platelets: 285 10*3/uL (ref 150–400)
RBC: 3.69 MIL/uL — ABNORMAL LOW (ref 4.22–5.81)
RDW: 15.5 % (ref 11.5–15.5)
WBC: 7 10*3/uL (ref 4.0–10.5)
nRBC: 0 % (ref 0.0–0.2)

## 2019-03-04 LAB — PROTIME-INR
INR: 1.5 — ABNORMAL HIGH (ref 0.8–1.2)
Prothrombin Time: 18.1 seconds — ABNORMAL HIGH (ref 11.4–15.2)

## 2019-03-04 LAB — TROPONIN I (HIGH SENSITIVITY): Troponin I (High Sensitivity): 32 ng/L — ABNORMAL HIGH (ref <18)

## 2019-03-04 MED ORDER — IOHEXOL 350 MG/ML SOLN
100.0000 mL | Freq: Once | INTRAVENOUS | Status: AC | PRN
  Administered 2019-03-04: 100 mL via INTRAVENOUS

## 2019-03-04 MED ORDER — AMIODARONE HCL 200 MG PO TABS
200.0000 mg | ORAL_TABLET | Freq: Every day | ORAL | Status: DC
  Administered 2019-03-05: 200 mg via ORAL
  Filled 2019-03-04: qty 1

## 2019-03-04 MED ORDER — LISINOPRIL 5 MG PO TABS
2.5000 mg | ORAL_TABLET | Freq: Every day | ORAL | Status: DC
  Administered 2019-03-05: 2.5 mg via ORAL
  Filled 2019-03-04: qty 1

## 2019-03-04 MED ORDER — ONDANSETRON 4 MG PO TBDP
ORAL_TABLET | ORAL | Status: AC
  Filled 2019-03-04: qty 1

## 2019-03-04 MED ORDER — ONDANSETRON HCL 4 MG/2ML IJ SOLN
4.0000 mg | Freq: Once | INTRAMUSCULAR | Status: AC
  Administered 2019-03-04: 4 mg via INTRAVENOUS
  Filled 2019-03-04: qty 2

## 2019-03-04 MED ORDER — MORPHINE SULFATE (PF) 4 MG/ML IV SOLN
4.0000 mg | Freq: Once | INTRAVENOUS | Status: AC
  Administered 2019-03-04: 4 mg via INTRAVENOUS
  Filled 2019-03-04: qty 1

## 2019-03-04 MED ORDER — METOPROLOL SUCCINATE ER 50 MG PO TB24
50.0000 mg | ORAL_TABLET | Freq: Two times a day (BID) | ORAL | Status: DC
  Administered 2019-03-05: 50 mg via ORAL
  Filled 2019-03-04: qty 1

## 2019-03-04 MED ORDER — SODIUM CHLORIDE 0.9% FLUSH
10.0000 mL | Freq: Two times a day (BID) | INTRAVENOUS | Status: DC
  Administered 2019-03-04 – 2019-03-05 (×2): 10 mL

## 2019-03-04 MED ORDER — LEVOTHYROXINE SODIUM 50 MCG PO TABS
50.0000 ug | ORAL_TABLET | Freq: Every day | ORAL | Status: DC
  Administered 2019-03-05: 50 ug via ORAL
  Filled 2019-03-04: qty 1

## 2019-03-04 MED ORDER — SODIUM CHLORIDE 0.9% FLUSH: 10.0000 mL | INTRAVENOUS | Status: DC | PRN

## 2019-03-04 MED ORDER — ONDANSETRON HCL 4 MG/2ML IJ SOLN
4.0000 mg | Freq: Four times a day (QID) | INTRAMUSCULAR | Status: DC | PRN
  Administered 2019-03-05: 4 mg via INTRAVENOUS
  Filled 2019-03-04: qty 2

## 2019-03-04 MED ORDER — ATORVASTATIN CALCIUM 20 MG PO TABS: 40.0000 mg | ORAL_TABLET | Freq: Every day | ORAL | Status: DC

## 2019-03-04 MED ORDER — MORPHINE SULFATE (PF) 4 MG/ML IV SOLN: 4.0000 mg | Freq: Once | INTRAVENOUS | Status: DC

## 2019-03-04 MED ORDER — DULOXETINE HCL 20 MG PO CPEP
20.0000 mg | ORAL_CAPSULE | Freq: Every day | ORAL | Status: DC
  Administered 2019-03-05: 20 mg via ORAL
  Filled 2019-03-04: qty 1

## 2019-03-04 MED ORDER — TRAZODONE HCL 50 MG PO TABS: 150.0000 mg | ORAL_TABLET | Freq: Every evening | ORAL | Status: DC | PRN

## 2019-03-04 MED ORDER — ALLOPURINOL 100 MG PO TABS
100.0000 mg | ORAL_TABLET | Freq: Every day | ORAL | Status: DC
  Administered 2019-03-05: 100 mg via ORAL
  Filled 2019-03-04: qty 1

## 2019-03-04 MED ORDER — ENOXAPARIN SODIUM 100 MG/ML ~~LOC~~ SOLN
190.0000 mg | Freq: Once | SUBCUTANEOUS | Status: AC
  Administered 2019-03-04: 190 mg via SUBCUTANEOUS
  Filled 2019-03-04: qty 2

## 2019-03-04 MED ORDER — SPIRONOLACTONE 25 MG PO TABS
25.0000 mg | ORAL_TABLET | Freq: Every day | ORAL | Status: DC
  Administered 2019-03-05: 25 mg via ORAL
  Filled 2019-03-04: qty 1

## 2019-03-04 MED ORDER — TORSEMIDE 20 MG PO TABS
60.0000 mg | ORAL_TABLET | Freq: Two times a day (BID) | ORAL | Status: DC
  Administered 2019-03-05: 60 mg via ORAL
  Filled 2019-03-04 (×2): qty 3

## 2019-03-04 MED ORDER — FERROUS SULFATE 325 (65 FE) MG PO TABS
325.0000 mg | ORAL_TABLET | Freq: Two times a day (BID) | ORAL | Status: DC
  Administered 2019-03-05: 325 mg via ORAL
  Filled 2019-03-04 (×2): qty 1

## 2019-03-04 MED ORDER — MORPHINE SULFATE (PF) 2 MG/ML IV SOLN
2.0000 mg | INTRAVENOUS | Status: DC | PRN
  Administered 2019-03-05 (×2): 2 mg via INTRAVENOUS
  Filled 2019-03-04 (×2): qty 1

## 2019-03-04 MED ORDER — NITROGLYCERIN 2 % TD OINT
1.0000 [in_us] | TOPICAL_OINTMENT | Freq: Four times a day (QID) | TRANSDERMAL | Status: DC
  Administered 2019-03-05: 1 [in_us] via TOPICAL
  Filled 2019-03-04: qty 1

## 2019-03-04 NOTE — Addendum Note (Signed)
Addended by: Berniece Pap on: 03/04/2019 07:15 PM   Modules accepted: Orders

## 2019-03-04 NOTE — ED Notes (Signed)
Patient transported to X-ray 

## 2019-03-04 NOTE — H&P (Signed)
History and Physical    [name] [name] YKD:[id number] DOB: [birth year] DOA: 03/04/2019  PCP: Berniece Pap, FNP  Patient coming from: Home, patient lives alone  I have personally briefly reviewed patient's old medical records in [facility]  Chief Complaint: Chest pain  HPI: [name] [name] is a 59 y.o. male with medical history significant of PE/DVT on Coumadin, presistent atrial fibrillation, CAD s/p MI, hypertension, hyperlipidemia, diet-controlled diabetes, COPD, hypothyroidism, depression, OSA on CPAP, HFrEF NICM, chronic venous stasis and lymphema with concerns of chest pain.  Chest pain started at 10 AM this morning when he was lying in bed.  Pain is left-sided and sharp pain would shoot across the right side of his chest.  This is worse with movement.  The pain has been constant since this morning.  It was associated with shortness of breath, nausea and cold sweats.  States it does not feel similar to his MI about 10 years ago.  He has had on and off chest pain since then due to blood clots but this is acutely worse. He has received up to 12 mg of morphine in the ED but continues to have 8 out of 10 pain.  Denies any reflux symptoms.  No vomiting, diarrhea or abdominal pain.  States he has had weight gain for the past 3 months since he has not been able to move due to hip pain.  However denies any acute weight gain or lower extremity edema.  He has persistent chronic venous stasis and lymphedema and was last seen by the wound clinic a week ago.  Denies any new pain or erythema to his lower extremities. Reports being compliant with his medication and last took his Coumadin yesterday.  Denies tobacco or illicit drug use.  Has occasional alcohol use.  ED Course: He was afebrile, mildly hypertensive up to 150s systolic on room.  WBC of 7, hgb of 10.7 which appears to be downward trending from 12 a month ago. Glucose of 110. Creatinine normal at 0.81.  Hypocalcemia down to 7.9,  albumin of 3 BNP of 189. Troponin at 32.  INR of 1.5 and was also at 1.7 on 02/28/2019.   CXR negative.  CTA chest showed distal defects with in subsegmental branches of left lobe that may reflect chronic emboli.   He received up to 12 mg morphine, 4 mg Zofran and weight-based Lovenox in the ED.  Review of Systems:  Constitutional: No Weight Change, No Fever ENT/Mouth: No sore throat, No Rhinorrhea Eyes: No Vision Changes Cardiovascular: + Chest Pain, +SOB Respiratory: No Cough, No Sputum,  Gastrointestinal: + Nausea, No Vomiting, No Diarrhea, No Constipation, No Pain Genitourinary: no Urinary Incontinence Musculoskeletal: No Arthralgias, No Myalgias Skin: No Skin Lesions, No Pruritus, Neuro: no Weakness, No Numbness Psych:  no decrease appetite Heme/Lymph: No Bruising, No Bleeding  Past Medical History:  Diagnosis Date  . Anxiety   . Arthritis   . Asthma   . Brain damage   . CHF (congestive heart failure) (HCC)   . Chronic pain of both knees 07/13/2018  . Clotting disorder (HCC)   . COPD (chronic obstructive pulmonary disease) (HCC)   . Depression   . HFrEF (heart failure with reduced ejection fraction) (HCC)    a. 03/2018 Echo: EF 25-30%, diff HK. Mod dil LA.  [name] History of DVT (deep vein thrombosis)   . History of pulmonary embolism    a. Chronic coumadin.  [name] Hypertension   . MI (myocardial infarction) (HCC)   .  Morbid obesity ([place])   . Neck pain 07/13/2018  . NICM (nonischemic cardiomyopathy) ([place])    a. s/p Cath x 3 - reportedly nl cors. Last cath 2019 in [place].  [name] Persistent atrial fibrillation ([place])    a. 03/2018 s/p DCCV; b. CHA2DS2VASc = 1-->Xarelto; c. 05/2018 recurrent afib-->Amio initiated;   . Sleep apnea     Past Surgical History:  Procedure Laterality Date  . CARDIOVERSION N/A 03/24/2018   Procedure: CARDIOVERSION;  Surgeon: Minna Merritts, MD;  Location: [facility] ORS;  Service: Cardiovascular;  Laterality: N/A;  . CARDIOVERSION N/A 08/08/2018   Procedure:  CARDIOVERSION;  Surgeon: Nelva Bush, MD;  Location: [facility] ORS;  Service: Cardiovascular;  Laterality: N/A;  . hearnia repair     X 3- total of two surgeries  . HERNIA REPAIR    . LEG SURGERY       reports that he has never smoked. He has never used smokeless tobacco. He reports current alcohol use. He reports that he does not use drugs.  No Known Allergies  Family History  Problem Relation Age of Onset  . Heart failure Mother   . Lung cancer Mother   . Lung cancer Father   . Heart attack Maternal Grandmother   . Heart attack Maternal Grandfather      Prior to Admission medications   Medication Sig Start Date End Date Taking? Authorizing Provider  albuterol (VENTOLIN HFA) 108 (90 Base) MCG/ACT inhaler Inhale 2 puffs into the lungs every 6 (six) hours as needed for wheezing or shortness of breath. 07/04/18  Yes Johnson, Megan P, DO  allopurinol (ZYLOPRIM) 100 MG tablet TAKE 1 TABLET BY MOUTH ONCE DAILY Patient taking differently: Take 100 mg by mouth daily.  01/17/19  Yes Johnson, Megan P, DO  amiodarone (PACERONE) 200 MG tablet Take 1 tablet (200 mg total) by mouth daily. 08/11/18  Yes Epifanio Lesches, MD  atorvastatin (LIPITOR) 40 MG tablet TAKE 1 TABLET BY MOUTH ONCE DAILY Patient taking differently: Take 40 mg by mouth daily.  09/08/18  Yes Johnson, Megan P, DO  DULoxetine (CYMBALTA) 20 MG capsule Take 20 mg by mouth daily.   Yes [provider]  ferrous sulfate 325 (65 FE) MG tablet Take 1 tablet (325 mg total) by mouth 2 (two) times daily with a meal. 01/05/19  Yes Johnson, Megan P, DO  levothyroxine (SYNTHROID) 50 MCG tablet Take 1 tablet (50 mcg total) by mouth daily before breakfast. 09/19/18  Yes Johnson, Megan P, DO  lisinopril (ZESTRIL) 2.5 MG tablet TAKE 1 TABLET BY MOUTH ONCE DAILY Patient taking differently: Take 2.5 mg by mouth daily.  01/02/19  Yes Johnson, Megan P, DO  metoprolol succinate (TOPROL-XL) 50 MG 24 hr tablet Take 50 mg by mouth 2 (two) times  daily. Take with or immediately following a meal.   Yes [provider]  nitroGLYCERIN (NITROSTAT) 0.4 MG SL tablet Place 1 tablet (0.4 mg total) under the tongue every 5 (five) minutes as needed for chest pain. 01/18/18  Yes Dustin Flock, MD  spironolactone (ALDACTONE) 25 MG tablet TAKE 1 TABLET BY MOUTH ONCE DAILY Patient taking differently: Take 25 mg by mouth daily.  01/17/19  Yes Johnson, Megan P, DO  torsemide (DEMADEX) 20 MG tablet Take 3 tablets (60 mg total) by mouth 2 (two) times daily. 01/27/19  Yes Lorella Nimrod, MD  traZODone (DESYREL) 150 MG tablet TAKE 1 TABLET BY MOUTH AT BEDTIME AS NEEDED SLEEP 02/23/19  Yes Johnson, Megan P, DO  warfarin (COUMADIN) 5  MG tablet Take by mouth daily as directed by the coumadin clinic Patient taking differently: Take 5-10 mg by mouth daily at 6 PM. Take by mouth daily as directed by the coumadin clinic 02/23/19  Yes Dunn, Raymon Mutton, PA-C  nystatin-triamcinolone ointment (MYCOLOG) Apply 1 application topically 2 (two) times daily. Patient not taking: Reported on 03/04/2019 12/25/18   Sondra Come, MD    Physical Exam: Vitals:   03/04/19 2019 03/04/19 2114 03/04/19 2115 03/04/19 2200  BP: (!) 130/54     Pulse: 62 62 70   Resp: 19   18  Temp:      TempSrc:      SpO2: 94% 95% 95%   Weight:      Height:        Constitutional: NAD, calm, comfortable morbidly obese male laying flat in bed on his left side  Vitals:   03/04/19 2019 03/04/19 2114 03/04/19 2115 03/04/19 2200  BP: (!) 130/54     Pulse: 62 62 70   Resp: 19   18  Temp:      TempSrc:      SpO2: 94% 95% 95%   Weight:      Height:       Eyes: PERRL, lids and conjunctivae normal ENMT: Mucous membranes are moist. Neck: normal, supple Respiratory: clear to auscultation bilaterally, no wheezing, no crackles. Normal respiratory effort on night time CPAP . No accessory muscle use.  Cardiovascular: Faint heart sound due to morbidly obese body habitus with S1S2 , no murmurs / rubs  / gallops. Non pitting lower extremity edema. Difficulty auscultating pedal pulses due to edema but has intact sensation.  Abdomen: no tenderness, Bowel sounds positive.  Musculoskeletal: no clubbing / cyanosis. No joint deformity upper and lower extremities.  Bilateral lower extremity wrapped in clean compression dressing.  Good ROM, no contractures. Normal muscle tone.  Skin: no rashes, lesions, ulcers. No induration Neurologic: CN 2-12 grossly intact. Sensation intact. Strength 5/5 in all 4.  Psychiatric: Normal judgment and insight. Alert and oriented x 3. Normal mood.    Labs on Admission: I have personally reviewed following labs and imaging studies  CBC: Recent Labs  Lab 03/04/19 1755  WBC 7.0  NEUTROABS 5.3  HGB 10.7*  HCT 33.4*  MCV 90.5  PLT 285   Basic Metabolic Panel: Recent Labs  Lab 03/04/19 1848  NA 139  K 3.7  CL 104  CO2 26  GLUCOSE 110*  BUN 12  CREATININE 0.81  CALCIUM 7.9*   GFR: Estimated Creatinine Clearance: 166.2 mL/min (by C-G formula based on SCr of 0.81 mg/dL). Liver Function Tests: Recent Labs  Lab 03/04/19 1848  AST 22  ALT 13  ALKPHOS 68  BILITOT 0.7  PROT 6.4*  ALBUMIN 3.0*   No results for input(s): LIPASE, AMYLASE in the last 168 hours. No results for input(s): AMMONIA in the last 168 hours. Coagulation Profile: Recent Labs  Lab 02/28/19 0000 03/04/19 1755  INR 1.7* 1.5*   Cardiac Enzymes: No results for input(s): CKTOTAL, CKMB, CKMBINDEX, TROPONINI in the last 168 hours. BNP (last 3 results) No results for input(s): PROBNP in the last 8760 hours. HbA1C: No results for input(s): HGBA1C in the last 72 hours. CBG: No results for input(s): GLUCAP in the last 168 hours. Lipid Profile: No results for input(s): CHOL, HDL, LDLCALC, TRIG, CHOLHDL, LDLDIRECT in the last 72 hours. Thyroid Function Tests: No results for input(s): TSH, T4TOTAL, FREET4, T3FREE, THYROIDAB in the last 72 hours. Anemia Panel:  No results for  input(s): VITAMINB12, FOLATE, FERRITIN, TIBC, IRON, RETICCTPCT in the last 72 hours. Urine analysis:    Component Value Date/Time   COLORURINE YELLOW (A) 01/21/2019 1221   APPEARANCEUR HAZY (A) 01/21/2019 1221   APPEARANCEUR Hazy (A) 12/25/2018 1442   LABSPEC 1.027 01/21/2019 1221   PHURINE 5.0 01/21/2019 1221   GLUCOSEU NEGATIVE 01/21/2019 1221   HGBUR NEGATIVE 01/21/2019 1221   BILIRUBINUR NEGATIVE 01/21/2019 1221   BILIRUBINUR Negative 12/25/2018 1442   KETONESUR NEGATIVE 01/21/2019 1221   PROTEINUR 30 (A) 01/21/2019 1221   NITRITE NEGATIVE 01/21/2019 1221   LEUKOCYTESUR TRACE (A) 01/21/2019 1221    Radiological Exams on Admission: CT Angio Chest PE W and/or Wo Contrast  Addendum Date: 03/04/2019   ADDENDUM REPORT: 03/04/2019 22:26 ADDENDUM: These results were called by telephone at the time of interpretation on 03/04/2019 at 10:26 pm to provider [facility] , who verbally acknowledged these results. Electronically Signed   By: Kreg Shropshire M.D.   On: 03/04/2019 22:26   Result Date: 03/04/2019 CLINICAL DATA:  Chest pain, shortness of breath, difficulty with positioning secondary to orthopnea EXAM: CT ANGIOGRAPHY CHEST WITH CONTRAST TECHNIQUE: Multidetector CT imaging of the chest was performed using the standard protocol during bolus administration of intravenous contrast. Multiplanar CT image reconstructions and MIPs were obtained to evaluate the vascular anatomy. CONTRAST:  OMNIPAQUE IOHEXOL 350 MG/ML SOLN COMPARISON:  Radiograph 03/04/2019, CT angiography 10/24/2018 FINDINGS: Cardiovascular: Borderline opacification of the central pulmonary arteries may limit detection of smaller segmental and subsegmental pulmonary emboli. Question few distal filling defects within the subsegmental branches of the left lower lobe (5/77). These correspond well to prior CTA and likely reflect chronic emboli. No new significant clot burden is seen. No evidence of right heart strain or  pulmonary artery enlargement. Normal heart size. No pericardial effusion. Atherosclerotic plaque within the normal caliber aorta. No gross luminal abnormality or periaortic stranding or hemorrhage. Shared origin of the brachiocephalic and left common carotid artery. Mediastinum/Nodes: No enlarged mediastinal, hilar, or axillary lymph nodes. Thyroid gland, trachea, and esophagus demonstrate no significant findings. Lungs/Pleura: Minimal atelectatic changes. No consolidation, features of edema, pneumothorax, or effusion. No suspicious pulmonary nodules or masses. Abundant subpleural fat. Upper Abdomen: Some anterior abdominal wall laxity similar to comparison exam. Musculoskeletal: Multilevel degenerative changes are present in the imaged portions of the spine. No acute osseous abnormality or suspicious osseous lesion. Stable appearance of the extensive sclerotic changes in the lower thoracic spine involving T8-T11 levels. Similar appearing sclerosis is present in the C6-C7 levels as well. Review of the MIP images confirms the above findings. IMPRESSION: Borderline opacification of the central pulmonary arteries may limit detection of smaller segmental and subsegmental pulmonary emboli. Few distal defects within the subsegmental branches of the left lower lobe (5/77). These correspond well to prior CTA and likely reflect chronic emboli. No new significant clot burden is seen. Stable sclerotic changes in the lower thoracic and lower cervical spine. In the absence of known malignancy, these changes are favored to reflect advanced spondylitic changes. No worrisome features or destructive change to suggest underlying discitis/osteomyelitis. Aortic Atherosclerosis (ICD10-I70.0). Currently attempting to contact the ordering provider with a critical value result. Addendum will be submitted upon case discussion. Electronically Signed: By: Kreg Shropshire M.D. On: 03/04/2019 22:11   DG Chest Portable 1 View  Result Date:  03/04/2019 CLINICAL DATA:  58 year old male with left-sided chest pain and shortness of breath EXAM: PORTABLE CHEST 1 VIEW COMPARISON:  Chest radiograph dated 01/21/2019 FINDINGS:  Stable mild cardiomegaly. No focal consolidation, pleural effusion, pneumothorax. Mild diffuse interstitial prominence similar to prior radiograph. No acute osseous pathology. IMPRESSION: No focal consolidation.  No interval change. Electronically Signed   By: Elgie Collard M.D.   On: 03/04/2019 17:31    EKG: Independently reviewed.   Assessment/Plan  Chest pain with history of CAD s/p MI reportedly clean cath in [place] about a year ago per cardiology documentation on 01/2019 outpt visit  Per last cardiology visit on 1/7- repeat Echo was recommended with consideration of ICD if EF remains less than 35%- will obtain Troponin of 32- continue to trend Add nitro topical paste PRN morphine for pain  Cardiology consult in the morning  Hx of PE/DVT has hx of recurrent PE while on Xarelto  CTA chest shows possible chronic distal emboli  continue warfarin and also weight base Lovenox while he is subtherapeutic given finding of possible new emboli on CT   Hypocalcemia Ca of 7.9 and albumin of 8 which corrected to 8.7 which is around his baseline  Persistent atrial fibrillation continue amiodarone and metoprolol  continue warfarin   Subtherapeutic INR  INR of 1.5 on admit  warfarin dose per pharmacy  HFrEF Last echo 2/20- EF 25-30% Continue torsemide 60 mg twice daily continue spironolactone  Anemia  Continue iron supplement  COPD Not in acute exacerbation  HTN Continue lisinopril  HLD continue statin  Hypothyroidism TSH elevated on 01/22/2019- will repeat Continue levothyroxine  OSA CPAP  Chronic venous stasis/lymphedema Wound care per RN  Continue compression dressing  Morbid obesity  BMI >60  DVT prophylaxis:.Lovenox bridge and Warfarin Code Status: Full Family Communication: Plan  discussed with patient at bedside  disposition Plan: Home with at least 2 midnight stays  Consults called:  Admission status: inpatient

## 2019-03-04 NOTE — ED Notes (Signed)
Pt requesting C-pap to sleep with. Spoke with MD regarding- MD to hold off until admission/discharge decision made. Pt notified- verbalizes understanding.

## 2019-03-04 NOTE — ED Notes (Signed)
Light green recollect sent to lab.  

## 2019-03-04 NOTE — ED Notes (Signed)
Pt provided urinal. Pt sitting on edge of bed. Pt educated on admission status and plan of care- he verbalizes understanding.

## 2019-03-04 NOTE — ED Provider Notes (Signed)
[facility] Emergency Department Provider Note ____________________________________________   First MD Initiated Contact with Patient 03/04/19 1721     (approximate)  I have reviewed the triage vital signs and the nursing notes.   HISTORY  Chief Complaint Chest Pain, Nausea, and Shortness of Breath    HPI [name] [name] is a 59 y.o. male with PMH as noted below including COPD and CHF DVT and PE on Coumadin, hypertension, and CAD with a history of MI who presents with chest pain acute onset around 10 AM today, persistent course since then, and associated with shortness of breath and nausea.  It is mainly in the left side of his chest.  EMS reports that the patient had a run of what appeared to be rapid atrial fibrillation on the cardiac monitor accompanied by hypoxia.  However, this has now resolved.  The patient states that the pain does feel similar to a prior MI few years ago.   Past Medical History:  Diagnosis Date  . Anxiety   . Arthritis   . Asthma   . Brain damage   . CHF (congestive heart failure) ([place])   . Chronic pain of both knees 07/13/2018  . Clotting disorder ([place])   . COPD (chronic obstructive pulmonary disease) ([place])   . Depression   . HFrEF (heart failure with reduced ejection fraction) ([place])    a. 03/2018 Echo: EF 25-30%, diff HK. Mod dil LA.  [name] History of DVT (deep vein thrombosis)   . History of pulmonary embolism    a. Chronic coumadin.  [name] Hypertension   . MI (myocardial infarction) ([place])   . Morbid obesity ([place])   . Neck pain 07/13/2018  . NICM (nonischemic cardiomyopathy) ([place])    a. s/p Cath x 3 - reportedly nl cors. Last cath 2019 in [place].  [name] Persistent atrial fibrillation ([place])    a. 03/2018 s/p DCCV; b. CHA2DS2VASc = 1-->Xarelto; c. 05/2018 recurrent afib-->Amio initiated;   . Sleep apnea     Patient Active Problem List   Diagnosis Date Noted  . Long term (current) use of anticoagulants 02/23/2019  . Cellulitis 01/22/2019   . PE (pulmonary thromboembolism) ([place]) 01/21/2019  . Open leg wound 01/21/2019  . HLD (hyperlipidemia) 01/21/2019  . CAD (coronary artery disease) 01/21/2019  . Cellulitis of lower extremity 01/21/2019  . Noncompliance 01/09/2019  . Acquired thrombophilia ([place]) 11/28/2018  . Subtherapeutic international normalized ratio (INR) 11/11/2018  . Acute pulmonary embolism ([place]) 11/01/2018  . Depression, major, single episode, moderate ([place]) 09/17/2018  . Chest pain 08/06/2018  . Acute on chronic systolic CHF (congestive heart failure) ([place]) 07/18/2018  . Chronic atrial fibrillation ([place])   . Personal history of DVT (deep vein thrombosis) 07/13/2018  . History of pulmonary embolism (on Xarelto) 07/13/2018  . Chronic bilateral low back pain with bilateral sciatica 07/13/2018  . TBI (traumatic brain injury) ([place]) 07/04/2018  . Anemia 06/27/2018  . Abnormal thyroid blood test 06/27/2018  . Diet-controlled diabetes mellitus ([place]) 06/06/2018  . HTN (hypertension) 06/06/2018  . Gout 06/06/2018  . Morbid obesity ([place]) 06/06/2018  . Chronic pain 06/06/2018  . Ulcers of both lower extremities, limited to breakdown of skin ([place]) 06/06/2018  . Persistent atrial fibrillation ([place])   . Chronic systolic heart failure ([place]) 02/02/2018  . COPD (chronic obstructive pulmonary disease) ([place]) 02/02/2018  . Obstructive sleep apnea 02/02/2018  . Acute respiratory failure with hypoxia ([place]) 01/27/2018    Past Surgical History:  Procedure Laterality Date  . CARDIOVERSION N/A  03/24/2018   Procedure: CARDIOVERSION;  Surgeon: Antonieta Iba, MD;  Location: [facility] ORS;  Service: Cardiovascular;  Laterality: N/A;  . CARDIOVERSION N/A 08/08/2018   Procedure: CARDIOVERSION;  Surgeon: [name], MD;  Location: [facility] ORS;  Service: Cardiovascular;  Laterality: N/A;  . hearnia repair     X 3- total of two surgeries  . HERNIA REPAIR    . LEG SURGERY      Prior to Admission medications   Medication Sig Start  Date End Date Taking? Authorizing Provider  albuterol (VENTOLIN HFA) 108 (90 Base) MCG/ACT inhaler Inhale 2 puffs into the lungs every 6 (six) hours as needed for wheezing or shortness of breath. 07/04/18   Johnson, Megan P, DO  allopurinol (ZYLOPRIM) 100 MG tablet TAKE 1 TABLET BY MOUTH ONCE DAILY Patient taking differently: Take 100 mg by mouth daily.  01/17/19   Johnson, Megan P, DO  amiodarone (PACERONE) 200 MG tablet Take 1 tablet (200 mg total) by mouth daily. 08/11/18   Katha Hamming, MD  atorvastatin (LIPITOR) 40 MG tablet TAKE 1 TABLET BY MOUTH ONCE DAILY Patient taking differently: Take 40 mg by mouth daily.  09/08/18   Johnson, Megan P, DO  DULoxetine (CYMBALTA) 20 MG capsule Take 20 mg by mouth daily.    [provider]  ferrous sulfate 325 (65 FE) MG tablet Take 1 tablet (325 mg total) by mouth 2 (two) times daily with a meal. 01/05/19   Johnson, Megan P, DO  levothyroxine (SYNTHROID) 50 MCG tablet Take 1 tablet (50 mcg total) by mouth daily before breakfast. 09/19/18   Johnson, Megan P, DO  lisinopril (ZESTRIL) 2.5 MG tablet TAKE 1 TABLET BY MOUTH ONCE DAILY Patient taking differently: Take 2.5 mg by mouth daily.  01/02/19   Johnson, Megan P, DO  metoprolol succinate (TOPROL-XL) 50 MG 24 hr tablet Take 50 mg by mouth 2 (two) times daily. Take with or immediately following a meal.    [provider]  nitroGLYCERIN (NITROSTAT) 0.4 MG SL tablet Place 1 tablet (0.4 mg total) under the tongue every 5 (five) minutes as needed for chest pain. 01/18/18   Auburn Bilberry, MD  nystatin-triamcinolone ointment [facility]) Apply 1 application topically 2 (two) times daily. 12/25/18   Sondra Come, MD  spironolactone (ALDACTONE) 25 MG tablet TAKE 1 TABLET BY MOUTH ONCE DAILY Patient taking differently: Take 25 mg by mouth daily.  01/17/19   Johnson, Megan P, DO  torsemide (DEMADEX) 20 MG tablet Take 3 tablets (60 mg total) by mouth 2 (two) times daily. 01/27/19   Arnetha Courser, MD   traZODone (DESYREL) 150 MG tablet TAKE 1 TABLET BY MOUTH AT BEDTIME AS NEEDED SLEEP 02/23/19   [name], DO  warfarin (COUMADIN) 5 MG tablet Take by mouth daily as directed by the coumadin clinic 02/23/19   Sondra Barges, PA-C    Allergies Patient has no known allergies.  Family History  Problem Relation Age of Onset  . Heart failure Mother   . Lung cancer Mother   . Lung cancer Father   . Heart attack Maternal Grandmother   . Heart attack Maternal Grandfather     Social History Social History   Tobacco Use  . Smoking status: Never Smoker  . Smokeless tobacco: Never Used  Substance Use Topics  . Alcohol use: Yes  . Drug use: Never    Review of Systems  Constitutional: No fever. Eyes: No redness. ENT: No sore throat. Cardiovascular: Positive for chest pain. Respiratory: Positive  for shortness of breath. Gastrointestinal: Positive for nausea. Genitourinary: Negative for flank pain.  Musculoskeletal: Negative for back pain. Skin: Negative for rash. Neurological: Negative for headache.   ____________________________________________   PHYSICAL EXAM:  VITAL SIGNS: ED Triage Vitals  Enc Vitals Group     BP 03/04/19 1713 (!) 154/68     Pulse Rate 03/04/19 1713 64     Resp 03/04/19 1713 20     Temp 03/04/19 1713 98.8 F (37.1 C)     Temp Source 03/04/19 1713 Oral     SpO2 03/04/19 1713 93 %     Weight 03/04/19 1714 (!) 425 lb (192.8 kg)     Height 03/04/19 1714 5\' 8"  (1.727 m)     Head Circumference --      Peak Flow --      Pain Score 03/04/19 1714 9     Pain Loc --      Pain Edu? --      Excl. in GC? --     Constitutional: Alert and oriented.  Uncomfortable appearing but in no acute distress. Eyes: Conjunctivae are normal.  Head: Atraumatic. Nose: No congestion/rhinnorhea. Mouth/Throat: Mucous membranes are moist.   Neck: Normal range of motion.  Cardiovascular: Normal rate, regular rhythm. Grossly normal heart sounds.  Good peripheral  circulation. Respiratory: Normal respiratory effort.  No retractions. Lungs CTAB. Gastrointestinal: Soft and nontender. No distention.  Genitourinary: No flank tenderness. Musculoskeletal: Extremities warm and well perfused.  Bilateral distal lower extremities wrapped in dressings. Neurologic:  Normal speech and language. No gross focal neurologic deficits are appreciated.  Skin:  Skin is warm and dry. No rash noted. Psychiatric: Mood and affect are normal. Speech and behavior are normal.  ____________________________________________   LABS (all labs ordered are listed, but only abnormal results are displayed)  Labs Reviewed  CBC WITH DIFFERENTIAL/PLATELET - Abnormal; Notable for the following components:      Result Value   RBC 3.69 (*)    Hemoglobin 10.7 (*)    HCT 33.4 (*)    All other components within normal limits  BRAIN NATRIURETIC PEPTIDE - Abnormal; Notable for the following components:   B Natriuretic Peptide 189.0 (*)    All other components within normal limits  PROTIME-INR - Abnormal; Notable for the following components:   Prothrombin Time 18.1 (*)    INR 1.5 (*)    All other components within normal limits  COMPREHENSIVE METABOLIC PANEL - Abnormal; Notable for the following components:   Glucose, Bld 110 (*)    Calcium 7.9 (*)    Total Protein 6.4 (*)    Albumin 3.0 (*)    All other components within normal limits  TROPONIN I (HIGH SENSITIVITY) - Abnormal; Notable for the following components:   Troponin I (High Sensitivity) 32 (*)    All other components within normal limits   ____________________________________________  EKG  ED ECG REPORT I, 03/06/19, the attending physician, personally viewed and interpreted this ECG.  Date: 03/04/2019 EKG Time: 1713 Rate: 61 Rhythm: normal sinus rhythm QRS Axis: normal Intervals: LBBB ST/T Wave abnormalities: normal Narrative Interpretation: no evidence of acute ischemia; no significant change when  compared to EKG of 01/22/2019  ____________________________________________  RADIOLOGY  CXR: No acute abnormalities CT chest: Subsegmental left lower lobe emboli, likely chronic  ____________________________________________   PROCEDURES  Procedure(s) performed: No  Procedures  Critical Care performed: No ____________________________________________   INITIAL IMPRESSION / ASSESSMENT AND PLAN / ED COURSE  Pertinent labs & imaging results that were  available during my care of the patient were reviewed by me and considered in my medical decision making (see chart for details).  59 year old male with PMH as noted above including COPD, CHF, diabetes, OSA, PE/DVT as well as atrial fibrillation on Coumadin, and CAD status post MI who presents with acute onset of left-sided chest pain associated with shortness of breath and nausea since around 10 AM today.  Per EMS, the patient had a run of atrial fibrillation during transport.  I reviewed the past medical records in epic.  The patient's most recent admission was in December with chest pain and wound infection as the bilateral lower extremities requiring IV antibiotics.  The patient follows with Dr. Okey Dupre from cardiology and has had catheterizations showing clean coronary arteries.  He has heart failure with reduced ejection fraction.  On exam, the patient is uncomfortable appearing, nauseous, and spitting up into a bag.  However he has no respiratory distress or increased work of breathing.  His bilateral lower extremities are dressed and the dressings appear clean.  The remainder of the physical exam is unremarkable.  EKG shows LBBB with no acute ischemic changes.  Given the available history, differential includes ACS, acute PE, or benign etiology such as musculoskeletal pain, GERD, or gastritis.  We will obtain labs including cardiac enzymes, chest x-ray, give analgesia and antiemetic, and  reassess.  ----------------------------------------- 10:58 PM on 03/04/2019 -----------------------------------------  X-ray showed no acute abnormalities.  The patient's initial troponin was mildly elevated although consistent with prior troponin values.  The INR is slightly low.  The patient continued to have persistent left-sided chest pain.  Given this and the low INR as well as the relatively acute onset of the symptoms today, my concern was increased for acute PE.  I obtained a CT angio of the chest.  There is some evidence of emboli identified in the left lower lobe which may be chronic, but per radiology they cannot rule out an acute component.  Given that the patient has active pain in this area and a low INR, my suspicion is increased for acute PE.  I gave Lovenox and discussed the case with Dr. Cyndia Bent from the hospitalist service for admission and further monitoring.  _________________________  [name] [name] was evaluated in Emergency Department on 03/04/2019 for the symptoms described in the history of present illness. He was evaluated in the context of the global COVID-19 pandemic, which necessitated consideration that the patient might be at risk for infection with the SARS-CoV-2 virus that causes COVID-19. Institutional protocols and algorithms that pertain to the evaluation of patients at risk for COVID-19 are in a state of rapid change based on information released by regulatory bodies including the CDC and federal and state organizations. These policies and algorithms were followed during the patient's care in the ED.   ____________________________________________   FINAL CLINICAL IMPRESSION(S) / ED DIAGNOSES  Final diagnoses:  Chest pain, unspecified type  Pulmonary embolism without acute cor pulmonale, unspecified chronicity, unspecified pulmonary embolism type (HCC)      NEW MEDICATIONS STARTED DURING THIS VISIT:  New Prescriptions   No medications on file      Note:  This document was prepared using Dragon voice recognition software and may include unintentional dictation errors.    Dionne Bucy, MD 03/04/19 2300

## 2019-03-04 NOTE — ED Triage Notes (Addendum)
Per EMS, pt c/o sob, CP, and N/V that started this AM. EMS state he's negative for STEMI but "had a run of something that caused his oxygen to drop." Pt reports taking 325 of ASA, no other meds given.  Pt AOX4, skin warm, dry. Legs wrapped and odorous. Pt is morbidly obese.

## 2019-03-04 NOTE — ED Notes (Signed)
Attempted iv x2 w/ out success. [place] IV in progress.

## 2019-03-04 NOTE — ED Notes (Signed)
Pt back from CT

## 2019-03-04 NOTE — ED Notes (Signed)
Patient transported to CT 

## 2019-03-04 NOTE — ED Notes (Signed)
Called lab to confirm receipt of green top for troponin- lab states it's received and in process and will result 45-60 mins after collection time.

## 2019-03-04 NOTE — ED Notes (Signed)
Called RT to notify pt will be on C-pap to sleep (uses at home).

## 2019-03-04 NOTE — ED Notes (Signed)
Lab requests new light green- will collect

## 2019-03-04 NOTE — ED Notes (Signed)
Pt states the first dose of morphine took the edge off his CP slightly, but requests morphine as ordered. Will medicate and reassess.

## 2019-03-04 NOTE — ED Notes (Signed)
Pt removes BP cuff and monitor leads despite education regarding monitoring. Replaced o2 probe and educated pt on use.

## 2019-03-05 ENCOUNTER — Telehealth: Payer: Self-pay | Admitting: Internal Medicine

## 2019-03-05 ENCOUNTER — Telehealth: Payer: Self-pay | Admitting: Adult Health

## 2019-03-05 ENCOUNTER — Inpatient Hospital Stay: Payer: Medicaid Other

## 2019-03-05 ENCOUNTER — Inpatient Hospital Stay: Admit: 2019-03-05 | Payer: Medicaid Other

## 2019-03-05 ENCOUNTER — Encounter: Payer: Self-pay | Admitting: Family Medicine

## 2019-03-05 ENCOUNTER — Ambulatory Visit: Payer: Self-pay | Admitting: Pharmacist

## 2019-03-05 ENCOUNTER — Ambulatory Visit: Admission: RE | Admit: 2019-03-05 | Payer: Medicaid Other | Source: Ambulatory Visit

## 2019-03-05 ENCOUNTER — Other Ambulatory Visit: Payer: Self-pay

## 2019-03-05 DIAGNOSIS — M1611 Unilateral primary osteoarthritis, right hip: Secondary | ICD-10-CM | POA: Diagnosis present

## 2019-03-05 DIAGNOSIS — S069X9S Unspecified intracranial injury with loss of consciousness of unspecified duration, sequela: Secondary | ICD-10-CM | POA: Diagnosis not present

## 2019-03-05 DIAGNOSIS — M1A9XX0 Chronic gout, unspecified, without tophus (tophi): Secondary | ICD-10-CM

## 2019-03-05 DIAGNOSIS — M25551 Pain in right hip: Secondary | ICD-10-CM

## 2019-03-05 DIAGNOSIS — E119 Type 2 diabetes mellitus without complications: Secondary | ICD-10-CM

## 2019-03-05 LAB — CBC
HCT: 32.5 % — ABNORMAL LOW (ref 39.0–52.0)
Hemoglobin: 10.3 g/dL — ABNORMAL LOW (ref 13.0–17.0)
MCH: 28.8 pg (ref 26.0–34.0)
MCHC: 31.7 g/dL (ref 30.0–36.0)
MCV: 90.8 fL (ref 80.0–100.0)
Platelets: 235 10*3/uL (ref 150–400)
RBC: 3.58 MIL/uL — ABNORMAL LOW (ref 4.22–5.81)
RDW: 15.4 % (ref 11.5–15.5)
WBC: 7.1 10*3/uL (ref 4.0–10.5)
nRBC: 0 % (ref 0.0–0.2)

## 2019-03-05 LAB — BASIC METABOLIC PANEL
Anion gap: 6 (ref 5–15)
BUN: 10 mg/dL (ref 6–20)
CO2: 29 mmol/L (ref 22–32)
Calcium: 7.9 mg/dL — ABNORMAL LOW (ref 8.9–10.3)
Chloride: 103 mmol/L (ref 98–111)
Creatinine, Ser: 0.89 mg/dL (ref 0.61–1.24)
GFR calc Af Amer: >60 mL/min (ref >60)
GFR calc non Af Amer: >60 mL/min (ref >60)
Glucose, Bld: 115 mg/dL — ABNORMAL HIGH (ref 70–99)
Potassium: 3.5 mmol/L (ref 3.5–5.1)
Sodium: 138 mmol/L (ref 135–145)

## 2019-03-05 LAB — TSH: TSH: 21.002 u[IU]/mL — ABNORMAL HIGH (ref 0.350–4.500)

## 2019-03-05 LAB — APTT: aPTT: 30 seconds (ref 24–36)

## 2019-03-05 LAB — TROPONIN I (HIGH SENSITIVITY)
Troponin I (High Sensitivity): 32 ng/L — ABNORMAL HIGH (ref <18)
Troponin I (High Sensitivity): 33 ng/L — ABNORMAL HIGH (ref <18)

## 2019-03-05 LAB — T4, FREE: Free T4: 0.63 ng/dL (ref 0.61–1.12)

## 2019-03-05 MED ORDER — WARFARIN - PHARMACIST DOSING INPATIENT
Freq: Every day | Status: DC
  Filled 2019-03-05: qty 1

## 2019-03-05 MED ORDER — LIDOCAINE 5 % EX PTCH: 1.0000 | MEDICATED_PATCH | CUTANEOUS | 0 refills | Status: DC

## 2019-03-05 MED ORDER — ENOXAPARIN SODIUM 300 MG/3ML IJ SOLN
190.0000 mg | Freq: Two times a day (BID) | INTRAMUSCULAR | Status: DC
  Filled 2019-03-05: qty 1.9

## 2019-03-05 MED ORDER — WARFARIN SODIUM 5 MG PO TABS
5.0000 mg | ORAL_TABLET | Freq: Once | ORAL | Status: AC
  Administered 2019-03-05: 5 mg via ORAL
  Filled 2019-03-05: qty 1

## 2019-03-05 MED ORDER — ONDANSETRON HCL 4 MG/2ML IJ SOLN: 4.0000 mg | Freq: Once | INTRAMUSCULAR | Status: DC

## 2019-03-05 MED ORDER — WARFARIN SODIUM 5 MG PO TABS
5.0000 mg | ORAL_TABLET | Freq: Once | ORAL | Status: DC
  Filled 2019-03-05: qty 1

## 2019-03-05 MED ORDER — LIDOCAINE 5 % EX PTCH
1.0000 | MEDICATED_PATCH | CUTANEOUS | Status: DC
  Administered 2019-03-05: 1 via TRANSDERMAL
  Filled 2019-03-05: qty 1

## 2019-03-05 MED ORDER — FUROSEMIDE 10 MG/ML IJ SOLN
20.0000 mg | Freq: Once | INTRAMUSCULAR | Status: AC
  Administered 2019-03-05: 20 mg via INTRAVENOUS
  Filled 2019-03-05: qty 4

## 2019-03-05 MED ORDER — ENOXAPARIN SODIUM 100 MG/ML ~~LOC~~ SOLN
190.0000 mg | Freq: Two times a day (BID) | SUBCUTANEOUS | Status: DC
  Administered 2019-03-05: 190 mg via SUBCUTANEOUS
  Filled 2019-03-05: qty 2

## 2019-03-05 NOTE — Chronic Care Management (AMB) (Signed)
   Care Management   Note  03/05/2019 Name: [name] [name] MRN: [id number] DOB: 04-Aug-1960  [name] [name] is a 59 y.o. year old male who is a primary care patient of Flinchum, [name], FNP. [name] [name] is currently enrolled in care management services. An additional referral for RN Case Manager was placed.   Follow up plan: The care management team will reach out to the patient again over the next 7 days. , A HIPPA compliant phone message was left for the patient providing contact information and requesting a return call.  and If patient returns call to provider office, please advise to call Embedded Care Management Care Guide Elisha Ponder LPN at [phone]  Elisha Ponder, LPN Health Advisor, Embedded Care Coordination [facility] Health Care Management ??[email or web address] [email or web address] ??[phone]

## 2019-03-05 NOTE — H&P (Signed)
Cardiology Consult    Patient ID: [name] [name] MRN: [id number], DOB/AGE: October 22, 1960   Admit date: 03/04/2019 Date of Consult: 03/05/2019  Primary Physician: Berniece Pap, FNP Primary Cardiologist: [name], MD Requesting Provider: Chancy Milroy, MD  Patient Profile    [name] [name] is a 59 y.o. male with a history of chronic combined systolic and diastolic congestive heart failure, nonischemic cardiomyopathy with an EF of 25-30%, hypertension, persistent atrial fibrillation on amiodarone, DVT/PE on warfarin, obstructive sleep apnea, obesity, and chronic lower extremity edema with cellulitis, who is being seen today for the evaluation of chest pain at the request of Dr. Rito Ehrlich.  Past Medical History   Past Medical History:  Diagnosis Date  . Anxiety   . Arthritis   . Asthma   . Brain damage   . CHF (congestive heart failure) (HCC)   . Chronic pain of both knees 07/13/2018  . Clotting disorder (HCC)   . COPD (chronic obstructive pulmonary disease) (HCC)   . Depression   . HFrEF (heart failure with reduced ejection fraction) (HCC)    a. 03/2018 Echo: EF 25-30%, diff HK. Mod dil LA.  [name] History of DVT (deep vein thrombosis)   . History of pulmonary embolism    a. Chronic coumadin.  [name] Hypertension   . MI (myocardial infarction) (HCC)   . Morbid obesity (HCC)   . Neck pain 07/13/2018  . NICM (nonischemic cardiomyopathy) (HCC)    a. s/p Cath x 3 - reportedly nl cors. Last cath 2019 in [place].  [name] Persistent atrial fibrillation (HCC)    a. 03/2018 s/p DCCV; b. CHA2DS2VASc = 1-->Xarelto; c. 05/2018 recurrent afib-->Amio initiated;   . Sleep apnea     Past Surgical History:  Procedure Laterality Date  . CARDIOVERSION N/A 03/24/2018   Procedure: CARDIOVERSION;  Surgeon: Antonieta Iba, MD;  Location: [facility] ORS;  Service: Cardiovascular;  Laterality: N/A;  . CARDIOVERSION N/A 08/08/2018   Procedure: CARDIOVERSION;  Surgeon: [name], MD;  Location: [facility] ORS;   Service: Cardiovascular;  Laterality: N/A;  . hearnia repair     X 3- total of two surgeries  . HERNIA REPAIR    . LEG SURGERY       Allergies  No Known Allergies  History of Present Illness    59 year old male with above complex past medical history including nonischemic cardiomyopathy and chronic combined systolic and diastolic congestive heart failure (EF 25-30% in February 2020, reportedly normal coronary arteries by catheterization in [place] in 2019), persistent atrial fibrillation on amiodarone, COPD, DVT/PE on chronic warfarin, obstructive sleep apnea, obesity, chronic lower extremity edema, and recurrent cellulitis.  In the setting of chronic lower extremity edema, he has required frequent outpatient adjustment of diuretic therapy.  He was admitted December 6 with reproducible left chest tenderness/pain and worsening lower extremity edema and erythema despite oral antibiotics.  He was diuresed and also treated with intravenous antibiotics.  At the time, high-sensitivity troponin was mildly elevated with a flat trend and peak of 32.  Following diuresis, he was discharged home.  He was seen via virtual visit on January 7 and reported a 17 pound weight loss since discharge, with significant provement in erythema and lower extremity swelling.  At the time, he is being maintained on torsemide 40 mg twice daily (notes indicated 60 bid, but he says he was only taking 40 bid), as well as spironolactone, lisinopril, and Toprol-XL.  He now says that his weight never dropped and there must have been a miscommunication  with our office, as his weight has only been climbing since discharge and most recently has been 425 pounds.  He attributes this largely to his right hip pain and inactivity.  He feels his volume status has been stable.  He has chronic dyspnea on exertion with minimal activity and occasionally notes palpitations, lasting just a few seconds or minutes and resolving spontaneously.  He has some  degree of chronic orthopnea, which is unchanged.  He has ongoing lower extremity edema, which he says is unchanged and has been managed by wound care.  On January 17, at approximately 10 AM, he noted worsening of dyspnea and also 9/10 steady left chest heaviness with occasional sharp and fleeting bursts of pain starting in the left shoulder and moving down through his chest toward the right.  The study heaviness seem to improve with applying pressure to the left chest.  Because of persistent symptoms, he called EMS.  He has some EMS ECGs at the bedside with very erratic baselines.  He thinks they told him he was in A. fib however on my review, these rhythms are very regular and appear to have significant baseline artifact.  ECG on arrival to the emergency department showed sinus rhythm with first-degree AV block.  Lab work on arrival was notable for mildly elevated BNP of 189.  This initial troponin was 32 with delta of 33 and subsequent of 32.  CT of the chest was negative for PE (INR subtherapeutic at 1.5).  He says that chest pain has improved some with morphine though pressing on his left chest also improved symptoms.  Chest heaviness does not change with position changes or deep breathing.  He is currently sitting at the bedside and says he is dyspneic with minimal activity in the room.  Inpatient Medications    . allopurinol  100 mg Oral Daily  . amiodarone  200 mg Oral Daily  . atorvastatin  40 mg Oral Daily  . DULoxetine  20 mg Oral Daily  . enoxaparin (LOVENOX) injection  190 mg Subcutaneous Q12H  . ferrous sulfate  325 mg Oral BID WC  . levothyroxine  50 mcg Oral QAC breakfast  . lisinopril  2.5 mg Oral Daily  . metoprolol succinate  50 mg Oral BID  . nitroGLYCERIN  1 inch Topical Q6H  . sodium chloride flush  10-40 mL Intracatheter Q12H  . spironolactone  25 mg Oral Daily  . torsemide  60 mg Oral BID  . warfarin  5 mg Oral Once  . warfarin  5 mg Oral ONCE-1800  . Warfarin - Pharmacist  Dosing Inpatient   Does not apply q1800    Family History    Family History  Problem Relation Age of Onset  . Heart failure Mother   . Lung cancer Mother   . Lung cancer Father   . Heart attack Maternal Grandmother   . Heart attack Maternal Grandfather    He indicated that his mother is deceased. He indicated that his father is deceased. He indicated that both of his sisters are alive. He indicated that his maternal grandmother is deceased. He indicated that his maternal grandfather is deceased. He indicated that his paternal grandmother is deceased. He indicated that his paternal grandfather is deceased.   Social History    Social History   Socioeconomic History  . Marital status: Single    Spouse name: Not on file  . Number of children: Not on file  . Years of education: Not on file  .  Highest education level: Not on file  Occupational History  . Not on file  Tobacco Use  . Smoking status: Never Smoker  . Smokeless tobacco: Never Used  Substance and Sexual Activity  . Alcohol use: Yes  . Drug use: Never  . Sexual activity: Not Currently  Other Topics Concern  . Not on file  Social History Narrative  . Not on file   Social Determinants of Health   Financial Resource Strain:   . Difficulty of Paying Living Expenses: Not on file  Food Insecurity:   . Worried About Programme researcher, broadcasting/film/video in the Last Year: Not on file  . Ran Out of Food in the Last Year: Not on file  Transportation Needs:   . Lack of Transportation (Medical): Not on file  . Lack of Transportation (Non-Medical): Not on file  Physical Activity:   . Days of Exercise per Week: Not on file  . Minutes of Exercise per Session: Not on file  Stress:   . Feeling of Stress : Not on file  Social Connections:   . Frequency of Communication with Friends and Family: Not on file  . Frequency of Social Gatherings with Friends and Family: Not on file  . Attends Religious Services: Not on file  . Active Member of  Clubs or Organizations: Not on file  . Attends Banker Meetings: Not on file  . Marital Status: Not on file  Intimate Partner Violence:   . Fear of Current or Ex-Partner: Not on file  . Emotionally Abused: Not on file  . Physically Abused: Not on file  . Sexually Abused: Not on file     Review of Systems    General:  No chills, fever, night sweats.  Weight gain in the setting of inactivity. Cardiovascular:  +++ Left chest heaviness and also fleeting sharp/shooting chest pain, +++ chronic but acutely worsened dyspnea on exertion, +++ edema, +++ chronic orthopnea, +++ occasional palpitations, no paroxysmal nocturnal dyspnea. Dermatological: No rash, lesions/masses Respiratory: No cough, +++ chronic dyspnea Urologic: No hematuria, dysuria Abdominal:   No nausea, vomiting, diarrhea, bright red blood per rectum, melena, or hematemesis Neurologic:  No visual changes, wkns, changes in mental status. All other systems reviewed and are otherwise negative except as noted above.  Physical Exam    Blood pressure (!) 154/77, pulse (!) 58, temperature 98.8 F (37.1 C), temperature source Oral, resp. rate 18, height 5\' 8"  (1.727 m), weight (!) 192.8 kg, SpO2 97 %.  General: Pleasant, NAD Psych: Normal affect. Neuro: Alert and oriented X 3. Moves all extremities spontaneously. HEENT: Normal  Neck: Supple.  No bruits.  Difficult to gauge JVP secondary to body habitus/girth. Lungs:  Resp regular and unlabored, CTA. Heart: RRR no s3, s4, or murmurs. Abdomen: Obese, soft, non-tender, non-distended, BS + x 4.  Extremities: No clubbing, cyanosis.  Lower extremities are wrapped with evidence of at least 1+ bilateral lower extremity edema.  There is some exudate on the wrappings.  Unable to fully appreciate distal pulses through lower extremity wrappings.  Radials are 2+ bilaterally.  Labs    Cardiac Enzymes Recent Labs  Lab 03/04/19 1848 03/04/19 2349 03/05/19 0115  TROPONINIHS 32*  33* 32*      Lab Results  Component Value Date   WBC 7.1 03/05/2019   HGB 10.3 (L) 03/05/2019   HCT 32.5 (L) 03/05/2019   MCV 90.8 03/05/2019   PLT 235 03/05/2019    Recent Labs  Lab 03/04/19 1848 03/04/19 1848 03/05/19  0449  NA 139   < > 138  K 3.7   < > 3.5  CL 104   < > 103  CO2 26   < > 29  BUN 12   < > 10  CREATININE 0.81   < > 0.89  CALCIUM 7.9*   < > 7.9*  PROT 6.4*  --   --   BILITOT 0.7  --   --   ALKPHOS 68  --   --   ALT 13  --   --   AST 22  --   --   GLUCOSE 110*   < > 115*   < > = values in this interval not displayed.   Lab Results  Component Value Date   CHOL 153 01/22/2019   HDL 48 01/22/2019   LDLCALC 80 01/22/2019   TRIG 125 01/22/2019   Lab Results  Component Value Date   INR 1.5 (H) 03/04/2019   INR 1.7 (A) 02/28/2019   INR 2.8 02/23/2019      Radiology Studies    CT Angio Chest PE W and/or Wo Contrast  Addendum Date: 03/04/2019   ADDENDUM REPORT: 03/04/2019 22:26 ADDENDUM: These results were called by telephone at the time of interpretation on 03/04/2019 at 10:26 pm to provider [facility] , who verbally acknowledged these results. Electronically Signed   By: Kreg Shropshire M.D.   On: 03/04/2019 22:26   Result Date: 03/04/2019 CLINICAL DATA:  Chest pain, shortness of breath, difficulty with positioning secondary to orthopnea EXAM: CT ANGIOGRAPHY CHEST WITH CONTRAST TECHNIQUE: Multidetector CT imaging of the chest was performed using the standard protocol during bolus administration of intravenous contrast. Multiplanar CT image reconstructions and MIPs were obtained to evaluate the vascular anatomy. CONTRAST:  OMNIPAQUE IOHEXOL 350 MG/ML SOLN COMPARISON:  Radiograph 03/04/2019, CT angiography 10/24/2018 FINDINGS: Cardiovascular: Borderline opacification of the central pulmonary arteries may limit detection of smaller segmental and subsegmental pulmonary emboli. Question few distal filling defects within the subsegmental branches of the  left lower lobe (5/77). These correspond well to prior CTA and likely reflect chronic emboli. No new significant clot burden is seen. No evidence of right heart strain or pulmonary artery enlargement. Normal heart size. No pericardial effusion. Atherosclerotic plaque within the normal caliber aorta. No gross luminal abnormality or periaortic stranding or hemorrhage. Shared origin of the brachiocephalic and left common carotid artery. Mediastinum/Nodes: No enlarged mediastinal, hilar, or axillary lymph nodes. Thyroid gland, trachea, and esophagus demonstrate no significant findings. Lungs/Pleura: Minimal atelectatic changes. No consolidation, features of edema, pneumothorax, or effusion. No suspicious pulmonary nodules or masses. Abundant subpleural fat. Upper Abdomen: Some anterior abdominal wall laxity similar to comparison exam. Musculoskeletal: Multilevel degenerative changes are present in the imaged portions of the spine. No acute osseous abnormality or suspicious osseous lesion. Stable appearance of the extensive sclerotic changes in the lower thoracic spine involving T8-T11 levels. Similar appearing sclerosis is present in the C6-C7 levels as well. Review of the MIP images confirms the above findings. IMPRESSION: Borderline opacification of the central pulmonary arteries may limit detection of smaller segmental and subsegmental pulmonary emboli. Few distal defects within the subsegmental branches of the left lower lobe (5/77). These correspond well to prior CTA and likely reflect chronic emboli. No new significant clot burden is seen. Stable sclerotic changes in the lower thoracic and lower cervical spine. In the absence of known malignancy, these changes are favored to reflect advanced spondylitic changes. No worrisome features or destructive change to suggest underlying  discitis/osteomyelitis. Aortic Atherosclerosis (ICD10-I70.0). Currently attempting to contact the ordering provider with a critical value  result. Addendum will be submitted upon case discussion. Electronically Signed: By: Kreg Shropshire M.D. On: 03/04/2019 22:11   DG Chest Portable 1 View  Result Date: 03/04/2019 CLINICAL DATA:  59 year old male with left-sided chest pain and shortness of breath EXAM: PORTABLE CHEST 1 VIEW COMPARISON:  Chest radiograph dated 01/21/2019 FINDINGS: Stable mild cardiomegaly. No focal consolidation, pleural effusion, pneumothorax. Mild diffuse interstitial prominence similar to prior radiograph. No acute osseous pathology. IMPRESSION: No focal consolidation.  No interval change. Electronically Signed   By: Elgie Collard M.D.   On: 03/04/2019 17:31    ECG & Cardiac Imaging    Sinus rhythm, 61, first-degree AV block, left bundle branch block- personally reviewed.  Assessment & Plan    1.  Chest pain/elevated high-sensitivity troponin: Patient with prior history of nonischemic cardiomyopathy with an EF of 25% - 30% by echocardiogram in February 2020.  He has previously undergone 3 separate diagnostic catheterizations, the last of which he reports was performed in [place] in 2019 and was reportedly normal.  Is a history of atypical chest pain and elevated high-sensitivity troponin, and was seen in December for similar.  Patient has chronic dyspnea on exertion which worsened yesterday and became associated with a steady left chest pressure that improved by rubbing his chest or applying manual pressure.  He also noted intermittent sharp and shooting chest pain.  He called EMS and in the emergency department, he was found in sinus rhythm without acute ST or T changes.  His high-sensitivity troponin remains elevated, similar to his December admission, with a flat trend (32  33  32).  On examination, he notes that his chest pressure improves when I rub his left chest.  In all, symptoms are atypical flat troponin trend is not representative of ACS in this gentleman who had a reportedly normal cath just over a year ago.   Conservative therapy continuation of beta-blocker, ACE inhibitor, and statin therapy.  He is a poor candidate for noninvasive testing secondary to body habitus.  2.  Chronic combined systolic and diastolic congestive heart failure/nonischemic cardiomyopathy: As above, patient reports prior history of nonischemic cardiomyopathy with normal catheterization in 2019 performed in [place].  He has chronic dyspnea on exertion noted worsening of this yesterday.  His weight was 417 pounds when he left the hospital in December and he says that it has been climbing since-425 on his scales more recently.  He attributes this to chronic right hip pain and inability to be active.  He believes his volume status has been relatively stable with chronic lower extremity edema which has been unchanged and managed by wound care.  Here, his BNP is mildly elevated at 189, while his chest CT did not show significant edema.  He is currently taking torsemide 40 mg twice daily at home.  Continue diuretic dose along with beta-blocker and ACE inhibitor therapy.  3.  History of pulmonary embolism/DVT: Patient is now on chronic warfarin with subtherapeutic INR of 1.7 on January 13.  INR was again subtherapeutic this morning at 1.5.  In this setting, CTA of the chest was performed with suggestion of chronic left lower lobe PE.  Continue warfarin with low threshold to bridge if patient is admitted.  4.  Lower extremity cellulitis: Patient says this has been stable as he completed a course of antibiotics in December.  5.  Persistent atrial fibrillation: Maintaining sinus rhythm on oral amiodarone  and beta-blocker therapy.  Continue warfarin.  INR subtherapeutic as above.  6.  Essential hypertension: Blood pressure elevated in the emergency department.  Resume home medications, follow, and adjust as necessary.  7.  Hyperlipidemia: LDL of 80 in December 2020 with reportedly normal coronary arteries on catheterization 2019.  Continue  statin.  8.  Normocytic anemia: Stable.  9.  Obstructive sleep apnea: Uses CPAP at home.  Signed, Nicolasa Ducking, NP 03/05/2019, 8:50 AM  For questions or updates, please contact   Please consult [email or web address] for contact info under Cardiology/STEMI.

## 2019-03-05 NOTE — Progress Notes (Addendum)
ANTICOAGULATION CONSULT NOTE - Initial Consult  Pharmacy Consult for warfarin/lovenox bridge Indication: H/o PE/DVT and afib, on warfarin currently subtherapeutic  No Known Allergies  Patient Measurements: Height: 5\' 8"  (172.7 cm) Weight: (!) 425 lb (192.8 kg) IBW/kg (Calculated) : 68.4  Vital Signs: BP: 158/80 (01/18 0448) Pulse Rate: 60 (01/18 0448)  Labs: Recent Labs    03/04/19 1755 03/04/19 1848 03/04/19 2349 03/05/19 0115 03/05/19 0449  HGB 10.7*  --   --   --  10.3*  HCT 33.4*  --   --   --  32.5*  PLT 285  --   --   --  235  APTT  --   --  30  --   --   LABPROT 18.1*  --   --   --   --   INR 1.5*  --   --   --   --   CREATININE  --  0.81  --   --  0.89  TROPONINIHS  --  32* 33* 32*  --     Estimated Creatinine Clearance: 151.3 mL/min (by C-G formula based on SCr of 0.89 mg/dL).   Medical History: Past Medical History:  Diagnosis Date  . Anxiety   . Arthritis   . Asthma   . Brain damage   . CHF (congestive heart failure) (HCC)   . Chronic pain of both knees 07/13/2018  . Clotting disorder (HCC)   . COPD (chronic obstructive pulmonary disease) (HCC)   . Depression   . HFrEF (heart failure with reduced ejection fraction) (HCC)    a. 03/2018 Echo: EF 25-30%, diff HK. Mod dil LA.  04/2018 History of DVT (deep vein thrombosis)   . History of pulmonary embolism    a. Chronic coumadin.  [name] Hypertension   . MI (myocardial infarction) (HCC)   . Morbid obesity (HCC)   . Neck pain 07/13/2018  . NICM (nonischemic cardiomyopathy) (HCC)    a. s/p Cath x 3 - reportedly nl cors. Last cath 2019 in 2020.  [place] Persistent atrial fibrillation (HCC)    a. 03/2018 s/p DCCV; b. CHA2DS2VASc = 1-->Xarelto; c. 05/2018 recurrent afib-->Amio initiated;   . Sleep apnea     Medications:  Scheduled:  . allopurinol  100 mg Oral Daily  . amiodarone  200 mg Oral Daily  . atorvastatin  40 mg Oral Daily  . DULoxetine  20 mg Oral Daily  . ferrous sulfate  325 mg Oral BID WC  . levothyroxine   50 mcg Oral QAC breakfast  . lisinopril  2.5 mg Oral Daily  . metoprolol succinate  50 mg Oral BID  . nitroGLYCERIN  1 inch Topical Q6H  . sodium chloride flush  10-40 mL Intracatheter Q12H  . spironolactone  25 mg Oral Daily  . torsemide  60 mg Oral BID  . warfarin  5 mg Oral Once  . warfarin  5 mg Oral ONCE-1800  . Warfarin - Pharmacist Dosing Inpatient   Does not apply q1800    Assessment: Patient admitted x CP w/ h/o PE/DVT, afib on warfarin PTA (regimen below), and CAD s/p MI and HTN w/ subtherapeutic INR on arrival. Patient is being started on lovenox for bridge until patient has x2 therapeutic INRs and on parenteral anticoag for at least 5 days. Baseline CBC low but WNL for patient, aPTT WNL, INR subtherapeutic at 1.5.  Warfarin PTA: 10 mg on Tues, Wed, Thu, Sa 5 mg on Fri, Sun, Mon  Goal of Therapy:  INR  2-3 Monitor platelets by anticoagulation protocol: Yes   Plan:  Patient received dose of lovenox 190 mg subq x 1. Will continue lovenox 190 mg bid as long as CrCl remains above > 30 ml/min. Will also give warfarin 5 mg po x 1 and will give one dose of warfarin 5 mg at 1800 as well and continue to monitor daily INRs. Will monitor daily CBC's and adjust warfarin dose based on INR trend. Lovenox does not need any monitoring at this time, but will monitor for s/sx bleeding.  Tobie Lords, PharmD, BCPS Clinical Pharmacist 03/05/2019,6:33 AM

## 2019-03-05 NOTE — Consult Note (Signed)
Cardiology Consult    Patient ID: [name] [name] MRN: [id number], DOB/AGE: October 22, 1960   Admit date: 03/04/2019 Date of Consult: 03/05/2019  Primary Physician: Berniece Pap, FNP Primary Cardiologist: [name], MD Requesting Provider: Chancy Milroy, MD  Patient Profile    [name] [name] is a 59 y.o. male with a history of chronic combined systolic and diastolic congestive heart failure, nonischemic cardiomyopathy with an EF of 25-30%, hypertension, persistent atrial fibrillation on amiodarone, DVT/PE on warfarin, obstructive sleep apnea, obesity, and chronic lower extremity edema with cellulitis, who is being seen today for the evaluation of chest pain at the request of Dr. Rito Ehrlich.  Past Medical History   Past Medical History:  Diagnosis Date  . Anxiety   . Arthritis   . Asthma   . Brain damage   . CHF (congestive heart failure) (HCC)   . Chronic pain of both knees 07/13/2018  . Clotting disorder (HCC)   . COPD (chronic obstructive pulmonary disease) (HCC)   . Depression   . HFrEF (heart failure with reduced ejection fraction) (HCC)    a. 03/2018 Echo: EF 25-30%, diff HK. Mod dil LA.  [name] History of DVT (deep vein thrombosis)   . History of pulmonary embolism    a. Chronic coumadin.  [name] Hypertension   . MI (myocardial infarction) (HCC)   . Morbid obesity (HCC)   . Neck pain 07/13/2018  . NICM (nonischemic cardiomyopathy) (HCC)    a. s/p Cath x 3 - reportedly nl cors. Last cath 2019 in [place].  [name] Persistent atrial fibrillation (HCC)    a. 03/2018 s/p DCCV; b. CHA2DS2VASc = 1-->Xarelto; c. 05/2018 recurrent afib-->Amio initiated;   . Sleep apnea     Past Surgical History:  Procedure Laterality Date  . CARDIOVERSION N/A 03/24/2018   Procedure: CARDIOVERSION;  Surgeon: Antonieta Iba, MD;  Location: [facility] ORS;  Service: Cardiovascular;  Laterality: N/A;  . CARDIOVERSION N/A 08/08/2018   Procedure: CARDIOVERSION;  Surgeon: [name], MD;  Location: [facility] ORS;   Service: Cardiovascular;  Laterality: N/A;  . hearnia repair     X 3- total of two surgeries  . HERNIA REPAIR    . LEG SURGERY       Allergies  No Known Allergies  History of Present Illness    59 year old male with above complex past medical history including nonischemic cardiomyopathy and chronic combined systolic and diastolic congestive heart failure (EF 25-30% in February 2020, reportedly normal coronary arteries by catheterization in [place] in 2019), persistent atrial fibrillation on amiodarone, COPD, DVT/PE on chronic warfarin, obstructive sleep apnea, obesity, chronic lower extremity edema, and recurrent cellulitis.  In the setting of chronic lower extremity edema, he has required frequent outpatient adjustment of diuretic therapy.  He was admitted December 6 with reproducible left chest tenderness/pain and worsening lower extremity edema and erythema despite oral antibiotics.  He was diuresed and also treated with intravenous antibiotics.  At the time, high-sensitivity troponin was mildly elevated with a flat trend and peak of 32.  Following diuresis, he was discharged home.  He was seen via virtual visit on January 7 and reported a 17 pound weight loss since discharge, with significant provement in erythema and lower extremity swelling.  At the time, he is being maintained on torsemide 40 mg twice daily (notes indicated 60 bid, but he says he was only taking 40 bid), as well as spironolactone, lisinopril, and Toprol-XL.  He now says that his weight never dropped and there must have been a miscommunication  with our office, as his weight has only been climbing since discharge and most recently has been 425 pounds.  He attributes this largely to his right hip pain and inactivity.  He feels his volume status has been stable.  He has chronic dyspnea on exertion with minimal activity and occasionally notes palpitations, lasting just a few seconds or minutes and resolving spontaneously.  He has some  degree of chronic orthopnea, which is unchanged.  He has ongoing lower extremity edema, which he says is unchanged and has been managed by wound care.  On January 17, at approximately 10 AM, he noted worsening of dyspnea and also 9/10 steady left chest heaviness with occasional sharp and fleeting bursts of pain starting in the left shoulder and moving down through his chest toward the right.  The study heaviness seem to improve with applying pressure to the left chest.  Because of persistent symptoms, he called EMS.  He has some EMS ECGs at the bedside with very erratic baselines.  He thinks they told him he was in A. fib however on my review, these rhythms are very regular and appear to have significant baseline artifact.  ECG on arrival to the emergency department showed sinus rhythm with first-degree AV block.  Lab work on arrival was notable for mildly elevated BNP of 189.  This initial troponin was 32 with delta of 33 and subsequent of 32.  CT of the chest was negative for PE (INR subtherapeutic at 1.5).  He says that chest pain has improved some with morphine though pressing on his left chest also improved symptoms.  Chest heaviness does not change with position changes or deep breathing.  He is currently sitting at the bedside and says he is dyspneic with minimal activity in the room.  Inpatient Medications    . allopurinol  100 mg Oral Daily  . amiodarone  200 mg Oral Daily  . atorvastatin  40 mg Oral Daily  . DULoxetine  20 mg Oral Daily  . enoxaparin (LOVENOX) injection  190 mg Subcutaneous Q12H  . ferrous sulfate  325 mg Oral BID WC  . levothyroxine  50 mcg Oral QAC breakfast  . lisinopril  2.5 mg Oral Daily  . metoprolol succinate  50 mg Oral BID  . nitroGLYCERIN  1 inch Topical Q6H  . sodium chloride flush  10-40 mL Intracatheter Q12H  . spironolactone  25 mg Oral Daily  . torsemide  60 mg Oral BID  . warfarin  5 mg Oral Once  . warfarin  5 mg Oral ONCE-1800  . Warfarin - Pharmacist  Dosing Inpatient   Does not apply q1800    Family History    Family History  Problem Relation Age of Onset  . Heart failure Mother   . Lung cancer Mother   . Lung cancer Father   . Heart attack Maternal Grandmother   . Heart attack Maternal Grandfather    He indicated that his mother is deceased. He indicated that his father is deceased. He indicated that both of his sisters are alive. He indicated that his maternal grandmother is deceased. He indicated that his maternal grandfather is deceased. He indicated that his paternal grandmother is deceased. He indicated that his paternal grandfather is deceased.   Social History    Social History   Socioeconomic History  . Marital status: Single    Spouse name: Not on file  . Number of children: Not on file  . Years of education: Not on file  .  Highest education level: Not on file  Occupational History  . Not on file  Tobacco Use  . Smoking status: Never Smoker  . Smokeless tobacco: Never Used  Substance and Sexual Activity  . Alcohol use: Yes  . Drug use: Never  . Sexual activity: Not Currently  Other Topics Concern  . Not on file  Social History Narrative  . Not on file   Social Determinants of Health   Financial Resource Strain:   . Difficulty of Paying Living Expenses: Not on file  Food Insecurity:   . Worried About Programme researcher, broadcasting/film/video in the Last Year: Not on file  . Ran Out of Food in the Last Year: Not on file  Transportation Needs:   . Lack of Transportation (Medical): Not on file  . Lack of Transportation (Non-Medical): Not on file  Physical Activity:   . Days of Exercise per Week: Not on file  . Minutes of Exercise per Session: Not on file  Stress:   . Feeling of Stress : Not on file  Social Connections:   . Frequency of Communication with Friends and Family: Not on file  . Frequency of Social Gatherings with Friends and Family: Not on file  . Attends Religious Services: Not on file  . Active Member of  Clubs or Organizations: Not on file  . Attends Banker Meetings: Not on file  . Marital Status: Not on file  Intimate Partner Violence:   . Fear of Current or Ex-Partner: Not on file  . Emotionally Abused: Not on file  . Physically Abused: Not on file  . Sexually Abused: Not on file     Review of Systems    General:  No chills, fever, night sweats.  Weight gain in the setting of inactivity. Cardiovascular:  +++ Left chest heaviness and also fleeting sharp/shooting chest pain, +++ chronic but acutely worsened dyspnea on exertion, +++ edema, +++ chronic orthopnea, +++ occasional palpitations, no paroxysmal nocturnal dyspnea. Dermatological: No rash, lesions/masses Respiratory: No cough, +++ chronic dyspnea Urologic: No hematuria, dysuria Abdominal:   No nausea, vomiting, diarrhea, bright red blood per rectum, melena, or hematemesis Neurologic:  No visual changes, wkns, changes in mental status. All other systems reviewed and are otherwise negative except as noted above.  Physical Exam    Blood pressure (!) 154/77, pulse (!) 58, temperature 98.8 F (37.1 C), temperature source Oral, resp. rate 18, height 5\' 8"  (1.727 m), weight (!) 192.8 kg, SpO2 97 %.  General: Pleasant, NAD Psych: Normal affect. Neuro: Alert and oriented X 3. Moves all extremities spontaneously. HEENT: Normal  Neck: Supple.  No bruits.  Difficult to gauge JVP secondary to body habitus/girth. Lungs:  Resp regular and unlabored, CTA. Heart: RRR no s3, s4, or murmurs. Abdomen: Obese, soft, non-tender, non-distended, BS + x 4.  Extremities: No clubbing, cyanosis.  Lower extremities are wrapped with evidence of at least 1+ bilateral lower extremity edema.  There is some exudate on the wrappings.  Unable to fully appreciate distal pulses through lower extremity wrappings.  Radials are 2+ bilaterally.  Labs    Cardiac Enzymes Recent Labs  Lab 03/04/19 1848 03/04/19 2349 03/05/19 0115  TROPONINIHS 32*  33* 32*      Lab Results  Component Value Date   WBC 7.1 03/05/2019   HGB 10.3 (L) 03/05/2019   HCT 32.5 (L) 03/05/2019   MCV 90.8 03/05/2019   PLT 235 03/05/2019    Recent Labs  Lab 03/04/19 1848 03/04/19 1848 03/05/19  0449  NA 139   < > 138  K 3.7   < > 3.5  CL 104   < > 103  CO2 26   < > 29  BUN 12   < > 10  CREATININE 0.81   < > 0.89  CALCIUM 7.9*   < > 7.9*  PROT 6.4*  --   --   BILITOT 0.7  --   --   ALKPHOS 68  --   --   ALT 13  --   --   AST 22  --   --   GLUCOSE 110*   < > 115*   < > = values in this interval not displayed.   Lab Results  Component Value Date   CHOL 153 01/22/2019   HDL 48 01/22/2019   LDLCALC 80 01/22/2019   TRIG 125 01/22/2019   Lab Results  Component Value Date   INR 1.5 (H) 03/04/2019   INR 1.7 (A) 02/28/2019   INR 2.8 02/23/2019      Radiology Studies    CT Angio Chest PE W and/or Wo Contrast  Addendum Date: 03/04/2019   ADDENDUM REPORT: 03/04/2019 22:26 ADDENDUM: These results were called by telephone at the time of interpretation on 03/04/2019 at 10:26 pm to provider [facility] , who verbally acknowledged these results. Electronically Signed   By: Kreg Shropshire M.D.   On: 03/04/2019 22:26   Result Date: 03/04/2019 CLINICAL DATA:  Chest pain, shortness of breath, difficulty with positioning secondary to orthopnea EXAM: CT ANGIOGRAPHY CHEST WITH CONTRAST TECHNIQUE: Multidetector CT imaging of the chest was performed using the standard protocol during bolus administration of intravenous contrast. Multiplanar CT image reconstructions and MIPs were obtained to evaluate the vascular anatomy. CONTRAST:  OMNIPAQUE IOHEXOL 350 MG/ML SOLN COMPARISON:  Radiograph 03/04/2019, CT angiography 10/24/2018 FINDINGS: Cardiovascular: Borderline opacification of the central pulmonary arteries may limit detection of smaller segmental and subsegmental pulmonary emboli. Question few distal filling defects within the subsegmental branches of the  left lower lobe (5/77). These correspond well to prior CTA and likely reflect chronic emboli. No new significant clot burden is seen. No evidence of right heart strain or pulmonary artery enlargement. Normal heart size. No pericardial effusion. Atherosclerotic plaque within the normal caliber aorta. No gross luminal abnormality or periaortic stranding or hemorrhage. Shared origin of the brachiocephalic and left common carotid artery. Mediastinum/Nodes: No enlarged mediastinal, hilar, or axillary lymph nodes. Thyroid gland, trachea, and esophagus demonstrate no significant findings. Lungs/Pleura: Minimal atelectatic changes. No consolidation, features of edema, pneumothorax, or effusion. No suspicious pulmonary nodules or masses. Abundant subpleural fat. Upper Abdomen: Some anterior abdominal wall laxity similar to comparison exam. Musculoskeletal: Multilevel degenerative changes are present in the imaged portions of the spine. No acute osseous abnormality or suspicious osseous lesion. Stable appearance of the extensive sclerotic changes in the lower thoracic spine involving T8-T11 levels. Similar appearing sclerosis is present in the C6-C7 levels as well. Review of the MIP images confirms the above findings. IMPRESSION: Borderline opacification of the central pulmonary arteries may limit detection of smaller segmental and subsegmental pulmonary emboli. Few distal defects within the subsegmental branches of the left lower lobe (5/77). These correspond well to prior CTA and likely reflect chronic emboli. No new significant clot burden is seen. Stable sclerotic changes in the lower thoracic and lower cervical spine. In the absence of known malignancy, these changes are favored to reflect advanced spondylitic changes. No worrisome features or destructive change to suggest underlying  discitis/osteomyelitis. Aortic Atherosclerosis (ICD10-I70.0). Currently attempting to contact the ordering provider with a critical value  result. Addendum will be submitted upon case discussion. Electronically Signed: By: Price  DeHay M.D. On: 03/04/2019 22:11   DG Chest Portable 1 View  Result Date: 03/04/2019 CLINICAL DATA:  50-year-old male with left-sided chest pain and shortness of breath EXAM: PORTABLE CHEST 1 VIEW COMPARISON:  Chest radiograph dated 01/21/2019 FINDINGS: Stable mild cardiomegaly. No focal consolidation, pleural effusion, pneumothorax. Mild diffuse interstitial prominence similar to prior radiograph. No acute osseous pathology. IMPRESSION: No focal consolidation.  No interval change. Electronically Signed   By: Arash  Radparvar M.D.   On: 03/04/2019 17:31    ECG & Cardiac Imaging    Sinus rhythm, 61, first-degree AV block, left bundle branch block- personally reviewed.  Assessment & Plan    1.  Chest pain/elevated high-sensitivity troponin: Patient with prior history of nonischemic cardiomyopathy with an EF of 25% - 30% by echocardiogram in February 2020.  He has previously undergone 3 separate diagnostic catheterizations, the last of which he reports was performed in [place] in 2019 and was reportedly normal.  Is a history of atypical chest pain and elevated high-sensitivity troponin, and was seen in December for similar.  Patient has chronic dyspnea on exertion which worsened yesterday and became associated with a steady left chest pressure that improved by rubbing his chest or applying manual pressure.  He also noted intermittent sharp and shooting chest pain.  He called EMS and in the emergency department, he was found in sinus rhythm without acute ST or T changes.  His high-sensitivity troponin remains elevated, similar to his December admission, with a flat trend (32  33  32).  On examination, he notes that his chest pressure improves when I rub his left chest.  In all, symptoms are atypical flat troponin trend is not representative of ACS in this gentleman who had a reportedly normal cath just over a year ago.   Conservative therapy continuation of beta-blocker, ACE inhibitor, and statin therapy.  He is a poor candidate for noninvasive testing secondary to body habitus.  2.  Chronic combined systolic and diastolic congestive heart failure/nonischemic cardiomyopathy: As above, patient reports prior history of nonischemic cardiomyopathy with normal catheterization in 2019 performed in [place].  He has chronic dyspnea on exertion noted worsening of this yesterday.  His weight was 417 pounds when he left the hospital in December and he says that it has been climbing since-425 on his scales more recently.  He attributes this to chronic right hip pain and inability to be active.  He believes his volume status has been relatively stable with chronic lower extremity edema which has been unchanged and managed by wound care.  Here, his BNP is mildly elevated at 189, while his chest CT did not show significant edema.  He is currently taking torsemide 40 mg twice daily at home.  Continue diuretic dose along with beta-blocker and ACE inhibitor therapy.  3.  History of pulmonary embolism/DVT: Patient is now on chronic warfarin with subtherapeutic INR of 1.7 on January 13.  INR was again subtherapeutic this morning at 1.5.  In this setting, CTA of the chest was performed with suggestion of chronic left lower lobe PE.  Continue warfarin with low threshold to bridge if patient is admitted.  4.  Lower extremity cellulitis: Patient says this has been stable as he completed a course of antibiotics in December.  5.  Persistent atrial fibrillation: Maintaining sinus rhythm on oral amiodarone   and beta-blocker therapy.  Continue warfarin.  INR subtherapeutic as above.  6.  Essential hypertension: Blood pressure elevated in the emergency department.  Resume home medications, follow, and adjust as necessary.  7.  Hyperlipidemia: LDL of 80 in December 2020 with reportedly normal coronary arteries on catheterization 2019.  Continue  statin.  8.  Normocytic anemia: Stable.  9.  Obstructive sleep apnea: Uses CPAP at home.  Signed, Jeanpaul Biehl, NP 03/05/2019, 8:50 AM  For questions or updates, please contact   Please consult [email or web address] for contact info under Cardiology/STEMI.   

## 2019-03-05 NOTE — ED Notes (Signed)
Per admitting MD, planning d/c for patient from ED. No need for Covid swab.

## 2019-03-05 NOTE — Chronic Care Management (AMB) (Signed)
  Care Management   Note  03/05/2019 Name: [name] [name] MRN: [id number] DOB: [birth year]  59 y.o. year old male referred to Care Management by Ma Rings Minor, RNCM for care management team continuity of care as patient has recently switched practices. Chronic conditions include atrial fibrillation, chronic pain, CHF, grief. First office visit with Berniece Pap, FNP was 03/01/19.   Was unable to reach patient via telephone today and have left HIPAA compliant voicemail asking patient to return my call. (unsuccessful outreach #1).  Follow up plan: A HIPPA compliant phone message was left for the patient providing contact information and requesting a return call.  The care management team will reach out to the patient again over the next 5-7 days.   Karalee Height, PharmD Clinical Pharmacist [facility] [phone]

## 2019-03-05 NOTE — ED Notes (Signed)
Spoke with Hassie Bruce and SWAT RN. SWAT RN gave instruction on how to pull midline. Catheter in tact. Gauze held on arm for 2 minutes and covered with tegaderm and reinforced with coban dressing for pressure. Pt tolerated well.

## 2019-03-05 NOTE — ED Notes (Signed)
Pt taken to MRI  

## 2019-03-05 NOTE — Discharge Summary (Signed)
Discharge Summary  [name] [name] HKV:[id number] DOB: 07-21-1960  PCP: Berniece Pap, FNP  Admit date: 03/04/2019 Discharge date: 03/05/2019  Time spent: 55 minutes  Recommendations for Outpatient Follow-up:  1. New medication: Lidocaine patch apply topically for 12 hours and off for 12 hours to right hip 2. Patient given referral to follow-up with orthopedic surgery 3. Patient follow-up with cardiology as scheduled  Discharge Diagnoses:  Active Hospital Problems   Diagnosis Date Noted  . Chest pain 08/06/2018  . Degenerative joint disease of right hip 03/05/2019  . Hypothyroidism 03/04/2019  . Chronic venous stasis dermatitis of both lower extremities 03/04/2019  . Long term (current) use of anticoagulants 02/23/2019  . PE (pulmonary thromboembolism) (HCC) 01/21/2019  . HLD (hyperlipidemia) 01/21/2019  . Subtherapeutic international normalized ratio (INR) 11/11/2018  . Personal history of DVT (deep vein thrombosis) 07/13/2018  . Anemia 06/27/2018  . HTN (hypertension) 06/06/2018  . Morbid obesity (HCC) 06/06/2018  . Persistent atrial fibrillation (HCC)   . COPD (chronic obstructive pulmonary disease) (HCC) 02/02/2018  . Obstructive sleep apnea 02/02/2018  . Chronic systolic heart failure (HCC) 02/02/2018  . Lymphedema 02/02/2018    Resolved Hospital Problems  No resolved problems to display.    Discharge Condition: Improved, being discharged home  Diet recommendation: Heart healthy  Vitals:   03/05/19 0925 03/05/19 1521  BP: (!) 153/85 (!) 157/92  Pulse:  63  Resp:  20  Temp:  98.4 F (36.9 C)  SpO2:  100%    History of present illness:  Patient is a 59 year old male with past medical history of morbid obesity with a weight of 425 pounds, chronic systolic/diastolic heart failure with nonischemic cardiomyopathy and ejection fraction 25-30%, essential hypertension and persistent atrial fibrillation as well as DVT/PE on Coumadin and obstructive sleep apnea  who presented to the emergency room on the night of 1/17 with complaints of right sided chest pain.  Hospital Course:  Principal Problem:   Chest pain: EKG unchanged.  Troponins elevated in the 30s, but in discussion with cardiology, patient's troponins have always remained elevated around this number.  He reportedly had a cath done in [place] and has had 3 Total all of which have always shown no evidence of occlusive disease.  His exam appears to be more consistent with musculoskeletal.  In fact, when examining the area he describes right above his left breast with palpation, I he says this increases his pain.  I suspect that as his weight has increased, the pull of his breast tissue has caused some musculoskeletal discomfort.  No need for any further cardiac intervention.  Seen by cardiology who agreed. Active Problems:   Chronic systolic heart failure (HCC): BNP normal.  Given patient's significant obesity, I suspect that this number may be falsely low so he received 1 dose of IV Lasix during hospitalization.    COPD (chronic obstructive pulmonary disease) (HCC): Stable during his hospitalization    Obstructive sleep apnea: He will continue his home nightly CPAP    Lymphedema/chronic venous stasis dermatitis of both lower extremities: Stable no signs of infection    Persistent atrial fibrillation (HCC): Rate controlled, on Coumadin  History of DVT/PE: INR slightly subtherapeutic.  Received extra dose of Coumadin on morning of 1/18 and he will resume his normal dosing tonight    HTN (hypertension): Stable continue home medications    Morbid obesity (HCC): Meets criteria with BMI greater than 40    Hypothyroidism:: Continue Synthroid    Degenerative joint disease of right hip:  In discussion with patient, he complains of severe right hip pain.  It is always there and worse when he tries to stand or move his right leg.  He states is been going on for some time.  He does not recall a  significant acute injury that set this off.  He said that this is been a significant reason of why that he is not been able to move very much and led to an increase in his weight.  I discussed this case with radiology and we attempted to get an MRI, but due to his large weight, he was not able to fit into the MRI scanner.  We got a CT scan of his right hip which noted significant progressive osteoarthritic changes of the right hip with erosions of the cortex of the superior aspect of the acetabulum.  Given these findings, have made referral to patient for orthopedic surgery.  Have also started him on lidocaine patch and given a prescription for this.   Procedures:  None  Consultations:  Cardiology  Discharge Exam: BP (!) 157/92 (BP Location: Left Arm)   Pulse 63   Temp 98.4 F (36.9 C)   Resp 20   Ht 5\' 8"  (1.727 m)   Wt (!) 192.8 kg   SpO2 100%   BMI 64.62 kg/m   General: Alert and oriented x3, no acute distress Cardiovascular: Regular rate and rhythm, S1-S2 Respiratory: Clear to auscultation bilaterally  Discharge Instructions You were cared for by a hospitalist during your hospital stay. If you have any questions about your discharge medications or the care you received while you were in the hospital after you are discharged, you can call the unit and asked to speak with the hospitalist on call if the hospitalist that took care of you is not available. Once you are discharged, your primary care physician will handle any further medical issues. Please note that NO REFILLS for any discharge medications will be authorized once you are discharged, as it is imperative that you return to your primary care physician (or establish a relationship with a primary care physician if you do not have one) for your aftercare needs so that they can reassess your need for medications and monitor your lab values.  Discharge Instructions    Diet - low sodium heart healthy   Complete by: As directed     Increase activity slowly   Complete by: As directed      Allergies as of 03/05/2019   No Known Allergies     Medication List    TAKE these medications   albuterol 108 (90 Base) MCG/ACT inhaler Commonly known as: VENTOLIN HFA Inhale 2 puffs into the lungs every 6 (six) hours as needed for wheezing or shortness of breath.   allopurinol 100 MG tablet Commonly known as: ZYLOPRIM TAKE 1 TABLET BY MOUTH ONCE DAILY   amiodarone 200 MG tablet Commonly known as: PACERONE Take 1 tablet (200 mg total) by mouth daily.   atorvastatin 40 MG tablet Commonly known as: LIPITOR TAKE 1 TABLET BY MOUTH ONCE DAILY   DULoxetine 20 MG capsule Commonly known as: CYMBALTA Take 20 mg by mouth daily.   ferrous sulfate 325 (65 FE) MG tablet Take 1 tablet (325 mg total) by mouth 2 (two) times daily with a meal.   levothyroxine 50 MCG tablet Commonly known as: SYNTHROID Take 1 tablet (50 mcg total) by mouth daily before breakfast.   lidocaine 5 % Commonly known as: LIDODERM Place 1 patch onto  the skin daily. Remove & Discard patch within 12 hours or as directed by MD   lisinopril 2.5 MG tablet Commonly known as: ZESTRIL TAKE 1 TABLET BY MOUTH ONCE DAILY   metoprolol succinate 50 MG 24 hr tablet Commonly known as: TOPROL-XL Take 50 mg by mouth 2 (two) times daily. Take with or immediately following a meal.   nitroGLYCERIN 0.4 MG SL tablet Commonly known as: NITROSTAT Place 1 tablet (0.4 mg total) under the tongue every 5 (five) minutes as needed for chest pain.   nystatin-triamcinolone ointment Commonly known as: MYCOLOG Apply 1 application topically 2 (two) times daily.   spironolactone 25 MG tablet Commonly known as: ALDACTONE TAKE 1 TABLET BY MOUTH ONCE DAILY   torsemide 20 MG tablet Commonly known as: DEMADEX Take 3 tablets (60 mg total) by mouth 2 (two) times daily.   traZODone 150 MG tablet Commonly known as: DESYREL TAKE 1 TABLET BY MOUTH AT BEDTIME AS NEEDED SLEEP    warfarin 5 MG tablet Commonly known as: Coumadin Take as directed. If you are unsure how to take this medication, talk to your nurse or doctor. Original instructions: Take by mouth daily as directed by the coumadin clinic What changed:   how much to take  how to take this  when to take this      No Known Allergies Follow-up Information    Hessie Knows, MD. Schedule an appointment as soon as possible for a visit.   Specialty: Orthopedic Surgery Why: Need for R Hip replacement Contact information: [address] [facility] [place] [address] [phone]            The results of significant diagnostics from this hospitalization (including imaging, microbiology, ancillary and laboratory) are listed below for reference.    Significant Diagnostic Studies: CT Angio Chest PE W and/or Wo Contrast  Addendum Date: 03/04/2019   ADDENDUM REPORT: 03/04/2019 22:26 ADDENDUM: These results were called by telephone at the time of interpretation on 03/04/2019 at 10:26 pm to provider [facility] , who verbally acknowledged these results. Electronically Signed   By: Lovena Le M.D.   On: 03/04/2019 22:26   Result Date: 03/04/2019 CLINICAL DATA:  Chest pain, shortness of breath, difficulty with positioning secondary to orthopnea EXAM: CT ANGIOGRAPHY CHEST WITH CONTRAST TECHNIQUE: Multidetector CT imaging of the chest was performed using the standard protocol during bolus administration of intravenous contrast. Multiplanar CT image reconstructions and MIPs were obtained to evaluate the vascular anatomy. CONTRAST:  137mL OMNIPAQUE IOHEXOL 350 MG/ML SOLN COMPARISON:  Radiograph 03/04/2019, CT angiography 10/24/2018 FINDINGS: Cardiovascular: Borderline opacification of the central pulmonary arteries may limit detection of smaller segmental and subsegmental pulmonary emboli. Question few distal filling defects within the subsegmental branches of the left lower lobe  (5/77). These correspond well to prior CTA and likely reflect chronic emboli. No new significant clot burden is seen. No evidence of right heart strain or pulmonary artery enlargement. Normal heart size. No pericardial effusion. Atherosclerotic plaque within the normal caliber aorta. No gross luminal abnormality or periaortic stranding or hemorrhage. Shared origin of the brachiocephalic and left common carotid artery. Mediastinum/Nodes: No enlarged mediastinal, hilar, or axillary lymph nodes. Thyroid gland, trachea, and esophagus demonstrate no significant findings. Lungs/Pleura: Minimal atelectatic changes. No consolidation, features of edema, pneumothorax, or effusion. No suspicious pulmonary nodules or masses. Abundant subpleural fat. Upper Abdomen: Some anterior abdominal wall laxity similar to comparison exam. Musculoskeletal: Multilevel degenerative changes are present in the imaged portions of the spine.  No acute osseous abnormality or suspicious osseous lesion. Stable appearance of the extensive sclerotic changes in the lower thoracic spine involving T8-T11 levels. Similar appearing sclerosis is present in the C6-C7 levels as well. Review of the MIP images confirms the above findings. IMPRESSION: Borderline opacification of the central pulmonary arteries may limit detection of smaller segmental and subsegmental pulmonary emboli. Few distal defects within the subsegmental branches of the left lower lobe (5/77). These correspond well to prior CTA and likely reflect chronic emboli. No new significant clot burden is seen. Stable sclerotic changes in the lower thoracic and lower cervical spine. In the absence of known malignancy, these changes are favored to reflect advanced spondylitic changes. No worrisome features or destructive change to suggest underlying discitis/osteomyelitis. Aortic Atherosclerosis (ICD10-I70.0). Currently attempting to contact the ordering provider with a critical value result. Addendum  will be submitted upon case discussion. Electronically Signed: By: Kreg Shropshire M.D. On: 03/04/2019 22:11   CT HIP RIGHT WO CONTRAST  Result Date: 03/05/2019 CLINICAL DATA:  Acute on chronic right hip pain. No acute injury. EXAM: CT OF THE RIGHT HIP WITHOUT CONTRAST TECHNIQUE: Multidetector CT imaging of the right hip was performed according to the standard protocol. Multiplanar CT image reconstructions were also generated. COMPARISON:  CT scan of the abdomen and pelvis dated 10/23/2018 FINDINGS: Bones/Joint/Cartilage There are severe progressive osteoarthritic changes of right hip with cystic degenerative changes of the femoral head and acetabulum with multiple erosions destroying the cortex of the superior aspect of the acetabulum. Tiny marginal osteophytes on the femoral head. No fracture. Calcific tendinopathy of the distal gluteus minimus tendon at its insertion on the greater trochanter. Small hip joint effusion. Muscles and Tendons Normal. Soft tissues Normal. IMPRESSION: Severe progressive osteoarthritic changes of the right hip with erosions of the cortex of the superior aspect of the acetabulum. No acute abnormalities. Electronically Signed   By: Francene Boyers M.D.   On: 03/05/2019 12:06   DG Chest Portable 1 View  Result Date: 03/04/2019 CLINICAL DATA:  59 year old male with left-sided chest pain and shortness of breath EXAM: PORTABLE CHEST 1 VIEW COMPARISON:  Chest radiograph dated 01/21/2019 FINDINGS: Stable mild cardiomegaly. No focal consolidation, pleural effusion, pneumothorax. Mild diffuse interstitial prominence similar to prior radiograph. No acute osseous pathology. IMPRESSION: No focal consolidation.  No interval change. Electronically Signed   By: Elgie Collard M.D.   On: 03/04/2019 17:31    Microbiology: No results found for this or any previous visit (from the past 240 hour(s)).   Labs: Basic Metabolic Panel: Recent Labs  Lab 03/04/19 1848 03/05/19 0449  NA 139 138   K 3.7 3.5  CL 104 103  CO2 26 29  GLUCOSE 110* 115*  BUN 12 10  CREATININE 0.81 0.89  CALCIUM 7.9* 7.9*   Liver Function Tests: Recent Labs  Lab 03/04/19 1848  AST 22  ALT 13  ALKPHOS 68  BILITOT 0.7  PROT 6.4*  ALBUMIN 3.0*   No results for input(s): LIPASE, AMYLASE in the last 168 hours. No results for input(s): AMMONIA in the last 168 hours. CBC: Recent Labs  Lab 03/04/19 1755 03/05/19 0449  WBC 7.0 7.1  NEUTROABS 5.3  --   HGB 10.7* 10.3*  HCT 33.4* 32.5*  MCV 90.5 90.8  PLT 285 235   Cardiac Enzymes: No results for input(s): CKTOTAL, CKMB, CKMBINDEX, TROPONINI in the last 168 hours. BNP: BNP (last 3 results) Recent Labs    08/06/18 1320 10/23/18 1817 03/04/19 1755  BNP 173.0* 157.0*  189.0*    ProBNP (last 3 results) No results for input(s): PROBNP in the last 8760 hours.  CBG: No results for input(s): GLUCAP in the last 168 hours.     Signed:  Hollice Espy, MD [facility] 03/05/2019, 4:39 PM

## 2019-03-05 NOTE — ED Notes (Signed)
Pt sitting up on stretcher- no needs identified at this time. Pt removed c-pap while awake. Pt removed monitor despite education on vital monitoring in ED.

## 2019-03-05 NOTE — Chronic Care Management (AMB) (Signed)
  Care Management   Care Coordination Note   03/05/2019 Name: [name] [name] MRN: [id number] DOB: 1960/05/19  Subjective [name] [name] is a 59 y.o. year old male who is a primary care patient of Flinchum, [name], FNP. The care management team was consulted for assistance with care management and care coordination needs. Patient had previously been engaged with CM team at [facility] and is now a patient here at [facility]. Received referral from Janci Minor, RNCM at [facility]. Successful telephone outreach today, HIPAA identifiers verified. Of note, patient is currently in ED for chest pain.   Review of patient status, including review of consultants reports, relevant laboratory and other test results, and collaboration with appropriate care team members and the patient's provider was performed as part of comprehensive patient evaluation and provision of chronic care management services.     Goals Addressed            This Visit's Progress   . PharmD "I'm on a lot of medications" (pt-stated)       Current Barriers:  [name] Knowledge Deficits related to optimal medication management o Literacy Barriers - is able to spell out medication names to me and instructions over the phone, but reads slowly  o Medication Management - considering pill packing in the future; however, patient notes he can't afford to fill all of his medications at once.  o Financial Barriers - Continues to report that he's having a hard time affording medications, food, etc since he received more aid/had lower copays in [place]. Patient notes he called and left a message with his social security case worker. [name] Appointment with Dr. Okey Dupre, cardiology, on 11/01/2018. Patient notes he continues to self-adjust and self-stop medications to save money. Previously recommended that he ask [facility] for payment programs or if they could waive copayments, but patient declined to ask these  questions.   Pharmacist Clinical Goal(s):  [name] Over the next 90 days, patient will work with CCM team to address needs related to optimized medication management  Interventions: . Discussed pill packing with [facility] again. Patient declines, noting that he can handle managing his medications. Will continue to try to reinforce medication adherence and making medication management a priority  . Collaborated with [name], LCSW. Provided patient direct number for Social Security office in [place] to contact to follow up on disability application.   [name] Updated 1/18: Patient feels medications are too expensive; PharmD notes he is on Medicaid; reviewed medication list for any superfluous medications, will follow up with patient 1/21  Patient Self Care Activities:  . Calls pharmacy for medication refills . Calls provider office for new concerns or questions  Please see past updates related to this goal by clicking on the "Past Updates" button in the selected goal         Plan and Follow up Telephone follow up appointment with care management team member scheduled for: Thursday, January 21 with PharmD  Karalee Height, PharmD Clinical Pharmacist [facility] [phone]

## 2019-03-05 NOTE — ED Notes (Signed)
Consult for iv team placed for midline removal.

## 2019-03-05 NOTE — Telephone Encounter (Signed)
*  STAT* If patient is at the pharmacy, call can be transferred to refill team.   1. Which medications need to be refilled? (please list name of each medication and dose if known) nitroglycerin   2. Which pharmacy/location (including street and city if local pharmacy) is medication to be sent to? [facility]  3. Do they need a 30 day or 90 day supply? 30 day

## 2019-03-05 NOTE — ED Notes (Signed)
Pt reports pain to hip 8/10. PRN morphine ordered and given.

## 2019-03-06 ENCOUNTER — Other Ambulatory Visit: Payer: Self-pay | Admitting: Family Medicine

## 2019-03-06 ENCOUNTER — Other Ambulatory Visit: Payer: Self-pay

## 2019-03-06 MED ORDER — NITROGLYCERIN 0.4 MG SL SUBL: 0.4000 mg | SUBLINGUAL_TABLET | SUBLINGUAL | 12 refills | Status: DC | PRN

## 2019-03-06 NOTE — Telephone Encounter (Signed)
Requested Prescriptions  Pending Prescriptions Disp Refills  . atorvastatin (LIPITOR) 40 MG tablet [Pharmacy Med Name: ATORVASTATIN CALCIUM 40 MG TAB] 30 tablet 2    Sig: TAKE 1 TABLET BY MOUTH ONCE DAILY     Cardiovascular:  Antilipid - Statins Passed - 03/06/2019  1:54 PM      Passed - Total Cholesterol in normal range and within 360 days    Cholesterol, Total  Date Value Ref Range Status  01/05/2019 159 100 - 199 mg/dL Final   Cholesterol  Date Value Ref Range Status  01/22/2019 153 0 - 200 mg/dL Final         Passed - LDL in normal range and within 360 days    LDL Chol Calc (NIH)  Date Value Ref Range Status  01/05/2019 73 0 - 99 mg/dL Final   LDL Cholesterol  Date Value Ref Range Status  01/22/2019 80 0 - 99 mg/dL Final    Comment:           Total Cholesterol/HDL:CHD Risk Coronary Heart Disease Risk Table                     Men   Women  1/2 Average Risk   3.4   3.3  Average Risk       5.0   4.4  2 X Average Risk   9.6   7.1  3 X Average Risk  23.4   11.0        Use the calculated Patient Ratio above and the CHD Risk Table to determine the patient's CHD Risk.        ATP III CLASSIFICATION (LDL):  <100     mg/dL   Optimal  100-129  mg/dL   Near or Above                    Optimal  130-159  mg/dL   Borderline  160-189  mg/dL   High  >190     mg/dL   Very High Performed at [facility], [address] [place], [place] [address]          Passed - HDL in normal range and within 360 days    HDL  Date Value Ref Range Status  01/22/2019 48 >40 mg/dL Final  01/05/2019 66 >39 mg/dL Final         Passed - Triglycerides in normal range and within 360 days    Triglycerides  Date Value Ref Range Status  01/22/2019 125 <150 mg/dL Final         Passed - Patient is not pregnant      Passed - Valid encounter within last 12 months    Recent Outpatient Visits          5 days ago Right hip pain   [facility] Flinchum, Kelby Aline, FNP   1  month ago Supratherapeutic INR   [place], [place], DO   2 months ago Essential hypertension   [place], Megan P, DO   3 months ago Other acute pulmonary embolism, unspecified whether acute cor pulmonale present [facility])   [place], Megan P, DO   3 months ago Chest pain, unspecified type   [facility] [name], DO      Future Appointments            In 1 week Dunn, Areta Haber, PA-C [facility], [facility]  In 2 months Flinchum, [name], FNP [facility], [facility]           . lisinopril (ZESTRIL) 2.5 MG tablet [Pharmacy Med Name: LISINOPRIL 2.5 MG TAB] 30 tablet 0    Sig: TAKE 1 TABLET BY MOUTH ONCE DAILY     Cardiovascular:  ACE Inhibitors Failed - 03/06/2019  1:54 PM      Failed - Last BP in normal range    BP Readings from Last 1 Encounters:  03/05/19 (!) 157/92         Passed - Cr in normal range and within 180 days    Creatinine, Ser  Date Value Ref Range Status  03/05/2019 0.89 0.61 - 1.24 mg/dL Final         Passed - K in normal range and within 180 days    Potassium  Date Value Ref Range Status  03/05/2019 3.5 3.5 - 5.1 mmol/L Final         Passed - Patient is not pregnant      Passed - Valid encounter within last 6 months    Recent Outpatient Visits          5 days ago Right hip pain   [facility] Flinchum, [name], FNP   1 month ago Supratherapeutic INR   [facility], [place], DO   2 months ago Essential hypertension   [facility] [place], Megan P, DO   3 months ago Other acute pulmonary embolism, unspecified whether acute cor pulmonale present [facility])   [facility] [place], Megan P, DO   3 months ago Chest pain, unspecified type   [facility] Dorcas Carrow, DO      Future Appointments            In 1 week Dunn, Raymon Mutton, PA-C [facility] ,  [facility]   In 2 months Flinchum, [name], FNP [facility], [facility]

## 2019-03-07 ENCOUNTER — Ambulatory Visit (INDEPENDENT_AMBULATORY_CARE_PROVIDER_SITE_OTHER): Payer: Medicaid Other

## 2019-03-07 ENCOUNTER — Encounter (INDEPENDENT_AMBULATORY_CARE_PROVIDER_SITE_OTHER): Payer: Medicaid Other

## 2019-03-07 DIAGNOSIS — Z86718 Personal history of other venous thrombosis and embolism: Secondary | ICD-10-CM

## 2019-03-07 DIAGNOSIS — I482 Chronic atrial fibrillation, unspecified: Secondary | ICD-10-CM | POA: Diagnosis not present

## 2019-03-07 DIAGNOSIS — Z7901 Long term (current) use of anticoagulants: Secondary | ICD-10-CM | POA: Diagnosis not present

## 2019-03-07 DIAGNOSIS — I2699 Other pulmonary embolism without acute cor pulmonale: Secondary | ICD-10-CM

## 2019-03-07 DIAGNOSIS — Z5181 Encounter for therapeutic drug level monitoring: Secondary | ICD-10-CM | POA: Diagnosis not present

## 2019-03-07 DIAGNOSIS — I872 Venous insufficiency (chronic) (peripheral): Secondary | ICD-10-CM | POA: Diagnosis not present

## 2019-03-07 LAB — POCT INR: INR: 1.7 — AB (ref 2.0–3.0)

## 2019-03-07 NOTE — Patient Instructions (Signed)
Spoke with Misty Stanley, [facility] RN w/ [facility] and advised her to have patient START NEW DOSAGE of 2 tablets daily except 1 tablet on Tuesday, Thursday and Saturday.  Recheck INR in 1 week.

## 2019-03-08 ENCOUNTER — Telehealth: Payer: Self-pay

## 2019-03-08 ENCOUNTER — Ambulatory Visit: Payer: Self-pay | Admitting: Pharmacist

## 2019-03-08 ENCOUNTER — Telehealth: Payer: Self-pay | Admitting: Adult Health

## 2019-03-08 ENCOUNTER — Other Ambulatory Visit: Payer: Self-pay | Admitting: Adult Health

## 2019-03-08 DIAGNOSIS — M25551 Pain in right hip: Secondary | ICD-10-CM

## 2019-03-08 DIAGNOSIS — D649 Anemia, unspecified: Secondary | ICD-10-CM

## 2019-03-08 DIAGNOSIS — I482 Chronic atrial fibrillation, unspecified: Secondary | ICD-10-CM

## 2019-03-08 DIAGNOSIS — I4891 Unspecified atrial fibrillation: Secondary | ICD-10-CM

## 2019-03-08 DIAGNOSIS — R110 Nausea: Secondary | ICD-10-CM

## 2019-03-08 DIAGNOSIS — K625 Hemorrhage of anus and rectum: Secondary | ICD-10-CM

## 2019-03-08 MED ORDER — NITROGLYCERIN 0.4 MG SL SUBL: SUBLINGUAL_TABLET | SUBLINGUAL | 3 refills | Status: AC

## 2019-03-08 MED ORDER — LEVOTHYROXINE SODIUM 50 MCG PO TABS: 50.0000 ug | ORAL_TABLET | Freq: Every day | ORAL | 1 refills | Status: DC

## 2019-03-08 MED ORDER — METOPROLOL SUCCINATE ER 50 MG PO TB24: 50.0000 mg | ORAL_TABLET | Freq: Two times a day (BID) | ORAL | 1 refills | Status: DC

## 2019-03-08 MED ORDER — FERROUS SULFATE 325 (65 FE) MG PO TABS: 325.0000 mg | ORAL_TABLET | Freq: Two times a day (BID) | ORAL | 1 refills | Status: DC

## 2019-03-08 NOTE — Telephone Encounter (Signed)
Yes this is fine to refer to Orthopedics of his choice, as I had actually ordered an x ray at our initial visit and he was to go that day but did not. I also sent you a message regarding a referral I placed to GI and labs from ER. Please see that note as well.

## 2019-03-08 NOTE — Addendum Note (Signed)
Addended by: Fonda Kinder on: 03/08/2019 03:57 PM   Modules accepted: Orders

## 2019-03-08 NOTE — Telephone Encounter (Signed)
Copied from CRM [id number]. Topic: General - Other >> Mar 08, 2019  3:20 PM Maye Hides wrote: Reason for CRM: Pt refused referral to GI

## 2019-03-08 NOTE — Progress Notes (Signed)
Refills requested on the following : Sent to [facility] as requested by Florene Glen care manager with [facility].  Given history and most recent lab in hospital TSH, suspect non compliance with medications. Will have Pharmacy CCM  follow up.TSH (Order [id number]) Contains abnormal data TSH 03/04/2019 hospital visit. Patient did not go for labs as  I ordered at new patient visit.   Order: [id number] Status:  Final result Visible to patient:  No (inaccessible in MyChart) Next appt:  03/15/2019 at 03:30 PM in Cardiology (Ryan Dunn, PA-C)  Ref Range & Units 4 d ago 1 mo ago 2 mo ago 5 mo ago  TSH 0.350 - 4.500 uIU/mL 21.002High   13.657High  CM  12.000High  R  8.090High  R        Anemia continues, he has had rectal bleeding over one year and was advised he needs to follow up with gastrointestinal MD.  Orders Placed This Encounter  Procedures  . Ambulatory referral to Gastroenterology    Meds ordered this encounter  Medications  . levothyroxine (SYNTHROID) 50 MCG tablet    Sig: Take 1 tablet (50 mcg total) by mouth daily before breakfast. Need lab repeat in 3 weeks from 03/08/19    Dispense:  30 tablet    Refill:  1  . metoprolol succinate (TOPROL-XL) 50 MG 24 hr tablet    Sig: Take 1 tablet (50 mg total) by mouth 2 (two) times daily. Take with or immediately following a meal.    Dispense:  30 tablet    Refill:  1  . ferrous sulfate 325 (65 FE) MG tablet    Sig: Take 1 tablet (325 mg total) by mouth 2 (two) times daily with a meal.    Dispense:  60 tablet    Refill:  1  . nitroGLYCERIN (NITROSTAT) 0.4 MG SL tablet    Sig: Place 1 tablet under tongue every 5 minutes as needed for chest pain. (No more than 3 doses within 15 minutes)    Dispense:  15 tablet    Refill:  3

## 2019-03-08 NOTE — Telephone Encounter (Signed)
Copied from CRM [id number]. Topic: General - Other >> Mar 08, 2019 11:33 AM Daphine Deutscher D wrote: Reason for CRM: Pt went to the ER [facility] Sunday because he was nauseated and feeling dizzy. He found out his PT-INR was high. They also did an xray of his hip because he was complaining of hip pain.  They referred him to [facility] to Dr. Dellis Filbert.  [facility] called him yesterday and told him in order for him to be seen he would need a referral from his primary.  He has [employer].  Pt's CB# [phone]

## 2019-03-08 NOTE — Telephone Encounter (Signed)
Spoke with Tobi Bastos from [facility] [place] she states that she is patient Futures trader and states that she needs to have a updated copy of patients medication list sent to [phone]. KW

## 2019-03-08 NOTE — Telephone Encounter (Signed)
Copied from CRM [id number]. Topic: General - Other >> Mar 08, 2019  1:23 PM Tamela Oddi wrote: Reason for CRM: Requests a medication list for the patient.  CB# [phone]

## 2019-03-08 NOTE — Telephone Encounter (Signed)
Please review chart and advise. KW 

## 2019-03-09 DIAGNOSIS — I872 Venous insufficiency (chronic) (peripheral): Secondary | ICD-10-CM | POA: Diagnosis not present

## 2019-03-09 NOTE — Progress Notes (Signed)
Virtual Visit via Telephone Note   This visit type was conducted due to national recommendations for restrictions regarding the COVID-19 Pandemic (e.g. social distancing) in an effort to limit this patient's exposure and mitigate transmission in our community.  Due to his co-morbid illnesses, this patient is at least at moderate risk for complications without adequate follow up.  This format is felt to be most appropriate for this patient at this time.  The patient did not have access to video technology/had technical difficulties with video requiring transitioning to audio format only (telephone).  All issues noted in this document were discussed and addressed.  No physical exam could be performed with this format.  Please refer to the patient's chart for his  consent to telehealth for [facility].   Date:  03/15/2019   ID:  [name] [name], DOB [birth year], MRN [id number]  Patient Location: Home Provider Location: Office  PCP:  [name] Pap, FNP  Cardiologist:  [name] Kendall, MD  Electrophysiologist:  None   Evaluation Performed:  Follow-Up Visit  Chief Complaint: Hospital follow-up  History of Present Illness:    [name] [name] is a 59 y.o. male with HFrEF secondary to NICM with patient reporting clean cardiac caths, most recently 1 year prior in [place], [place], persistent A. fib on amiodarone, HTN, COPD, DVT/PE on Coumadin, poorly controlled hypothyroidism, morbid obesity, chronic lower extremity edema with cellulitis, and OSA who presents for hospital follow-up from recent admission from 1/18 through 1/18 for musculoskeletal chest pain.  Patient has a history of nonischemic cardiomyopathy with an EF of 25 to 30% in 03/2018 with reportedly normal coronary arteries by cath in 2019.  He was seen in clinic in early 01/2019 at which time he was volume overloaded with a weight of 169 kg leading to titration of torsemide to 40 mg twice daily with plans for early follow-up.   However prior to this, he was admitted 4 days later with reproducible left-sided chest tenderness and pain with worsening lower extremity edema and erythema despite oral antibiotics.  He was treated with IV antibiotics for cellulitis.  High-sensitivity troponin was mildly elevated with a flat trend peaking at 32.  He was diuresed with IV Lasix with a discharge weight of 408 pounds with a reported dry weight of approximately 375 to 380 pounds.  He was discharged on increased dose of torsemide of 60 mg twice daily.  He was seen virtually in follow-up on 02/22/2019 with a weight of 400 pounds with improved lower extremity swelling and erythema.  He continued to note his functional status was significantly limited secondary to hip pain.  He was also having a gout flareup of his left wrist.  He denied any chest pain.  It was also noted that he had no follow-up plan with regards to monitoring his INR and in this setting he was referred to our Coumadin clinic.  He was continued on torsemide 60 mg twice daily along with spironolactone, lisinopril, and Toprol-XL.  Order was placed for home health RN to draw a BMP and fax [place] the results though it does not appear this was completed.  He presented to the ED on 03/04/2019 with chest pain with chronic dyspnea on exertion.  Symptoms were atypical in that they were intermittent and sharp shooting.  High-sensitivity troponin remained mildly elevated and flat trending consistent with prior readings with an initial reading of 32, delta 33, and repeat of 32.  His symptoms improved when his chest was rubbed on exam.  Due  to his body habitus, he was a poor candidate for noninvasive testing.  In the setting of the patient having previously undergone 3 separate cardiac catheterizations which showed nonobstructive disease no further ischemic evaluation was recommended.  In speaking with the patient over the phone today he indicates he is about the same.  His weight remains stable running  between 400-425 pounds.  He attributes some of his weight gain secondary to sedentary lifestyle with his hip pain.  He was evaluated by orthopedics on 1/27 with referral to bariatric surgery as patient will need to lose a significant amount of weight prior to being a candidate for surgical repair of his hip.  Initially, patient tells me he is taking "all of my medicines except for iron."  Upon asking him about his medications in detail he then tells me "I don't know what I am taking."  He does have home health coming out to his house to manage lower extremity wounds though they do not review his medications.  On his discharge summary from his recent hospital admission earlier this month he was directed to take torsemide 60 mg twice daily.  However, patient has been taking 40 mg twice daily.  His INR remains subtherapeutic.  He denies any falls, hematochezia, or melena.  We did discuss his reported prior cardiac catheterizations in which the patient continues to state he had no obstructive disease and indicates he does not intend on having a repeat cath moving forward.  He does not recall where these catheterizations were done and we have been unable to obtain prior records in this setting.   Labs independently reviewed: 03/13/2019 - INR 1.4 02/2019 - Hgb 10.3, PLT 235, potassium 3.5, BUN 10, serum creatinine 0.9, TSH 21.002, free T4 normal, albumin 3.0, AST/ALT normal 01/2019 - magnesium 1.8, TSH 13.657, free T3 normal, A1c 6.3, TC 153, TG 125, HDL 48, LDL 80  The patient does not have symptoms concerning for COVID-19 infection (fever, chills, cough, or new shortness of breath).    Past Medical History:  Diagnosis Date  . Anxiety   . Arthritis   . Asthma   . Brain damage   . Chronic pain of both knees 07/13/2018  . Clotting disorder (HCC)   . COPD (chronic obstructive pulmonary disease) (HCC)   . Depression   . HFrEF (heart failure with reduced ejection fraction) (HCC)    a. 03/2018 Echo: EF  25-30%, diff HK. Mod LAE.  [name] History of DVT (deep vein thrombosis)   . History of pulmonary embolism    a. Chronic coumadin.  [name] Hypertension   . MI (myocardial infarction) (HCC)   . Morbid obesity (HCC)   . Neck pain 07/13/2018  . NICM (nonischemic cardiomyopathy) (HCC)    a. s/p Cath x 3 - reportedly nl cors. Last cath 2019 in [place]; b. a. 03/2018 Echo: EF 25-30%, diff HK.  [name] Persistent atrial fibrillation (HCC)    a. 03/2018 s/p DCCV; b. CHA2DS2VASc = 1-->Xarelto (later changed to warfarin); c. 05/2018 recurrent afib-->Amio initiated.  . Sleep apnea    Past Surgical History:  Procedure Laterality Date  . CARDIOVERSION N/A 03/24/2018   Procedure: CARDIOVERSION;  Surgeon: Antonieta Iba, MD;  Location: [facility] ORS;  Service: Cardiovascular;  Laterality: N/A;  . CARDIOVERSION N/A 08/08/2018   Procedure: CARDIOVERSION;  Surgeon: [name] Kendall, MD;  Location: [facility] ORS;  Service: Cardiovascular;  Laterality: N/A;  . hearnia repair     X 3- total of two surgeries  . HERNIA REPAIR    .  LEG SURGERY       Current Meds  Medication Sig  . albuterol (VENTOLIN HFA) 108 (90 Base) MCG/ACT inhaler Inhale 2 puffs into the lungs every 6 (six) hours as needed for wheezing or shortness of breath.  . allopurinol (ZYLOPRIM) 100 MG tablet Take 1 tablet (100 mg total) by mouth daily.  [name] amiodarone (PACERONE) 200 MG tablet Take 1 tablet (200 mg total) by mouth daily.  [name] atorvastatin (LIPITOR) 40 MG tablet TAKE 1 TABLET BY MOUTH ONCE DAILY  . DULoxetine (CYMBALTA) 20 MG capsule Take 20 mg by mouth daily.  [name] levothyroxine (SYNTHROID) 50 MCG tablet Take 1 tablet (50 mcg total) by mouth daily before breakfast. Need lab repeat in 3 weeks from 03/08/19  . lisinopril (ZESTRIL) 2.5 MG tablet TAKE 1 TABLET BY MOUTH ONCE DAILY  . metoprolol succinate (TOPROL-XL) 50 MG 24 hr tablet Take 1 tablet (50 mg total) by mouth 2 (two) times daily. Take with or immediately following a meal.  . nitroGLYCERIN (NITROSTAT) 0.4 MG SL  tablet Place 1 tablet under tongue every 5 minutes as needed for chest pain. (No more than 3 doses within 15 minutes)  . spironolactone (ALDACTONE) 25 MG tablet TAKE 1 TABLET BY MOUTH ONCE DAILY (Patient taking differently: Take 25 mg by mouth daily. )  . torsemide (DEMADEX) 20 MG tablet Take 3 tablets (60 mg total) by mouth 2 (two) times daily.  . traZODone (DESYREL) 150 MG tablet TAKE 1 TABLET BY MOUTH AT BEDTIME AS NEEDED SLEEP  . warfarin (COUMADIN) 5 MG tablet Take by mouth daily as directed by the coumadin clinic (Patient taking differently: Take 5-10 mg by mouth daily at 6 PM. Take by mouth daily as directed by the coumadin clinic)     Allergies:   Patient has no known allergies.   Social History   Tobacco Use  . Smoking status: Never Smoker  . Smokeless tobacco: Never Used  Substance Use Topics  . Alcohol use: Yes  . Drug use: Never     Family Hx: The patient's family history includes Heart attack in his maternal grandfather and maternal grandmother; Heart failure in his mother; Lung cancer in his father and mother.  ROS:   Please see the history of present illness.     All other systems reviewed and are negative.   Prior CV studies:   The following studies were reviewed today:  2D echo 03/24/2018: 1. The left ventricle has severely reduced systolic function of 50-93%. The cavity size was mildly increased. There is no increased left ventricular wall thickness. Left ventricular diastology could not be evaluated due to nondiagnostic images. Left  ventricular diffuse hypokinesis. 2. Left atrial size was moderately dilated. 3. Not assessed. 4. The mitral valve was not assessed. There is mild thickening and mild calcification. Mitral valve regurgitation was not significant. 5. The tricuspid valve is not assessed. Tricuspid valve regurgitation was not assessed by color flow Doppler. 6. The aortic valve was not assessed. 7. The interatrial septum was not well  visualized.  Labs/Other Tests and Data Reviewed:    EKG:  An ECG dated 03/05/2019 was personally reviewed today and demonstrated:  NSR, 73 bpm, right axis deviation, poor R wave progression along the precordial leads, no acute ST-T changes  Recent Labs: 01/25/2019: Magnesium 1.8 03/04/2019: ALT 13; B Natriuretic Peptide 189.0; TSH 21.002 03/05/2019: BUN 10; Creatinine, Ser 0.89; Hemoglobin 10.3; Platelets 235; Potassium 3.5; Sodium 138   Recent Lipid Panel Lab Results  Component Value Date/Time  CHOL 153 01/22/2019 03:46 AM   CHOL 159 01/05/2019 04:45 PM   TRIG 125 01/22/2019 03:46 AM   HDL 48 01/22/2019 03:46 AM   HDL 66 01/05/2019 04:45 PM   CHOLHDL 3.2 01/22/2019 03:46 AM   LDLCALC 80 01/22/2019 03:46 AM   LDLCALC 73 01/05/2019 04:45 PM    Wt Readings from Last 3 Encounters:  03/15/19 (!) 420 lb (190.5 kg)  03/04/19 (!) 425 lb (192.8 kg)  02/22/19 (!) 400 lb (181.4 kg)     Objective:    Vital Signs:  Ht 5\' 8"  (1.727 m)   Wt (!) 420 lb (190.5 kg)   BMI 63.86 kg/m    VITAL SIGNS:  reviewed  ASSESSMENT & PLAN:    1. Chronic combined systolic and diastolic CHF: Patient's home weight has been stable between 400-425 pounds.  He attributes a significant amount of his weight gain secondary to hip pain with decreased functional status.  Continue torsemide 40 mg twice daily.  He also has spironolactone, lisinopril, and Toprol-XL on his medication list however he is uncertain if he is actually taking any of these.  Patient is quite confused with regards to his home medications.  He indicates home health has not been reviewing these.  I have referred him to the paramedicine service to assist with his medications and correspond with his medical team accordingly.  Once we have this information from paramedicine service, we can then make recommendations regarding GDMT.  For now, given the inability to routinely follow-up labs we will defer reinitiation of spironolactone.  Further  recommendations pending paramedicine findings.  We did briefly discuss the possibility of undergoing repeat cardiac cath given his cardiomyopathy and our inability to obtain prior cardiac records.  Patient indicates he has undergone multiple caths which have demonstrated normal coronaries dating "I just have a weak heart."  He indicates he does not want to proceed with repeat cardiac cath.  CHF education.  2. Persistent A. Fib: No symptoms concerning for recurrent tachypalpitations.  He was maintaining sinus rhythm when in the ED earlier this month.  It does not appear he has been taking amiodarone.  Given it appears he is maintaining sinus rhythm off amiodarone we will continue to defer resumption of this at this time after discussion with his primary cardiologist.  Continue rate control with metoprolol and Coumadin per Coumadin clinic.  Hemoglobin stable as outlined above.  3. History of DVT/PE: He was previously on Xarelto though in the setting of recurrent thrombus/embolus the recommendation was made to transition to indefinite Coumadin.  That said, his INRs are historically subtherapeutic.  Which brings to question we did actually be better for him to return to an alternative DOAC moving forward.  However, the data on DOAC's and morbid obese patients is lacking.  Therefore, we will continue with INRs through the Coumadin clinic with recommendation to obtain therapeutic INR.  No symptoms of active bleeding at this time.  4. HTN: Blood pressure was suboptimally controlled in the ED on 03/04/2019 with systolics in the 150s.  In the setting of patient confusion regarding medications with possible noncompliance with medical therapy, along with the patient's functional status being significantly limited at this time secondary to hip pain and morbid obesity with the inability to regularly obtain labs we have deferred titration of medical therapy at this time.  As above, he has been referred to the paramedicine  service to assist with his medications and correspond with his medical team their findings for appropriate adjustments.  5. HLD: Remains on atorvastatin with an LDL of 80 from 01/2019 and reported normal coronary arteries on cath approximately 1 year prior.  6. Normocytic anemia: Most recent hemoglobin stable at 10.3 with a noted hemoglobin around 13 in 11/2018.  He remains on warfarin as above given persistent A. fib and history of DVT/PE.  Follow-up with PCP as directed.  7. Morbid obesity: Patient has been referred to bariatric surgery.  The feels like a significant portion of his weight gain is in the setting of sedentary lifestyle with underlying hip pain.  COVID-19 Education: The signs and symptoms of COVID-19 were discussed with the patient and how to seek care for testing (follow up with PCP or arrange E-visit).  The importance of social distancing was discussed today.  Time:   Today, I have spent 15 minutes with the patient with telehealth technology discussing the above problems.     Medication Adjustments/Labs and Tests Ordered: Current medicines are reviewed at length with the patient today.  Concerns regarding medicines are outlined above.   Tests Ordered: No orders of the defined types were placed in this encounter.   Medication Changes: No orders of the defined types were placed in this encounter.   Follow Up:  Virtual Visit  in 1 month(s) (virtual in the setting of limited mobility secondary to hip pain and obesity)  Signed, [name], PA-C  03/15/2019 3:25 PM    [facility]

## 2019-03-09 NOTE — Progress Notes (Signed)
Spoke with patient on phone and advised below, he stated that he would not be able to be seen for in person evaluation because he is unable to lift his right leg and states it would be to difficult for him to get in vehicle. Patient had a scheduled appt with cardiology scheduled on 03/15/19 that he states that he will be canceling. Patient complained of joint pain and muscle pain and reports dyspnea on exertion. Per Marvell Fuller, patient was encouraged to keep cardiology appointment but until appointment to seek immediate medical attention in ED to evaluate new/worsening symptoms.KW

## 2019-03-09 NOTE — Telephone Encounter (Signed)
Patient missed call with me yesterday for pharmacy appointment. Thank you for the FYI, I will address adherence at our pharmacy visit.    Karalee Height, PharmD Clinical Pharmacist [facility] [phone]

## 2019-03-12 DIAGNOSIS — G4733 Obstructive sleep apnea (adult) (pediatric): Secondary | ICD-10-CM | POA: Diagnosis not present

## 2019-03-12 NOTE — Progress Notes (Signed)
  Care Management   Note  03/12/2019- late entry Name: [name] [name] MRN: [id number] DOB: 27-Mar-1960  59 y.o. year old male referred to Chronic Care Management by Ma Rings Minor, RNCM at [facility]  for continuity of CM care.   Was unable to reach patient via telephone today and have left HIPAA compliant voicemail asking patient to return my call. (unsuccessful outreach #1).  Follow up plan: A HIPPA compliant phone message was left for the patient providing contact information and requesting a return call.  The care management team will reach out to the patient again over the next 5-10 days.   Karalee Height, PharmD Clinical Pharmacist [facility] [phone]

## 2019-03-12 NOTE — Telephone Encounter (Signed)
FYI. KW 

## 2019-03-12 NOTE — Chronic Care Management (AMB) (Signed)
   Care Management   Note  03/12/2019 Name: [name] MRN: [id number] DOB: May 27, 1960  [name] is a 59 y.o. year old male who is a primary care patient of Flinchum, [name], FNP. Thor Nannini is currently enrolled in care management services. An additional referral for RN Case Manager was placed.   Follow up plan: Telephone appointment with care management team member scheduled for:03/20/2019  Elisha Ponder, LPN Health Advisor, Embedded Care Coordination [facility] Health Care Management ??[email or web address] ??[phone]

## 2019-03-13 ENCOUNTER — Other Ambulatory Visit: Payer: Self-pay | Admitting: *Deleted

## 2019-03-13 ENCOUNTER — Ambulatory Visit (INDEPENDENT_AMBULATORY_CARE_PROVIDER_SITE_OTHER): Payer: Medicaid Other | Admitting: *Deleted

## 2019-03-13 ENCOUNTER — Other Ambulatory Visit: Payer: Self-pay | Admitting: Adult Health

## 2019-03-13 ENCOUNTER — Telehealth: Payer: Self-pay | Admitting: Adult Health

## 2019-03-13 DIAGNOSIS — Z86718 Personal history of other venous thrombosis and embolism: Secondary | ICD-10-CM | POA: Diagnosis not present

## 2019-03-13 DIAGNOSIS — I872 Venous insufficiency (chronic) (peripheral): Secondary | ICD-10-CM | POA: Diagnosis not present

## 2019-03-13 DIAGNOSIS — I2699 Other pulmonary embolism without acute cor pulmonale: Secondary | ICD-10-CM

## 2019-03-13 DIAGNOSIS — Z7901 Long term (current) use of anticoagulants: Secondary | ICD-10-CM | POA: Diagnosis not present

## 2019-03-13 DIAGNOSIS — I482 Chronic atrial fibrillation, unspecified: Secondary | ICD-10-CM | POA: Diagnosis not present

## 2019-03-13 LAB — POCT INR: INR: 1.4 — AB (ref 2.0–3.0)

## 2019-03-13 MED ORDER — ALLOPURINOL 100 MG PO TABS: 100.0000 mg | ORAL_TABLET | Freq: Every day | ORAL | 0 refills | Status: DC

## 2019-03-13 MED ORDER — AMIODARONE HCL 200 MG PO TABS: 200.0000 mg | ORAL_TABLET | Freq: Every day | ORAL | 3 refills | Status: DC

## 2019-03-13 NOTE — Telephone Encounter (Signed)
Copied from CRM [id number]. Topic: General - Other >> Mar 13, 2019 12:16 PM Tamela Oddi wrote: Reason for CRM: Patient would like some medication for his gout flare up.  He has it in his hand and feet.  Please advise and call to discuss at [phone]

## 2019-03-13 NOTE — Patient Instructions (Signed)
Description   Spoke with Misty Stanley, [facility] RN w/ [facility] and advised her to have patient take 2 tablets today then start taking 2 tablets daily except 1 tablet on Tuesday and Saturday. Recheck INR in 1 week-Wednesday.

## 2019-03-13 NOTE — Telephone Encounter (Signed)
We discussed this last week on phone with [name] [name], I see that we didn't reorder Allopurinol, I remember from previous telephone encounter I mentioned that patient was told that Allopurinol was for preventative but not for treatment of gout. Please advise. KW

## 2019-03-13 NOTE — Telephone Encounter (Signed)
Please advise if ok to refill Amiodarone 200 mg tablet qd. Last filled by Katha Hamming, MD.

## 2019-03-13 NOTE — Telephone Encounter (Signed)
Yes we discussed with patient by phone 03/09/19 he needs evaluation in  office for diagnosis of gout in his hands and feet. He refused appointment in this office at that time due to transportation and then reported he has transportation available however still denied coming to office.  He was found to be in volume overload at the hospital most recent ER trip. I will not treat the gout without an evaluation in office. I can refill his maintenance medication  as he has been on this and it is preventative and do not recommend him doubling his medications as he reported last week. He should keep cardiology appointment on 03/15/2019 Seek care immediately if any symptoms change or worsen at anytime.

## 2019-03-14 ENCOUNTER — Telehealth: Payer: Self-pay | Admitting: Internal Medicine

## 2019-03-14 DIAGNOSIS — M1611 Unilateral primary osteoarthritis, right hip: Secondary | ICD-10-CM | POA: Diagnosis not present

## 2019-03-14 DIAGNOSIS — Z6841 Body Mass Index (BMI) 40.0 and over, adult: Secondary | ICD-10-CM | POA: Diagnosis not present

## 2019-03-14 NOTE — Telephone Encounter (Signed)
Pt c/o medication issue:  1. Name of Medication:  spironolactone   2. How are you currently taking this medication (dosage and times per day)? Currently taking 25 mg po q d   3. Are you having a reaction (difficulty breathing--STAT)? No   4. What is your medication issue? Noted that patient was to not restart taking   Please call to confirm newest instructions as patient received a refill from pcp

## 2019-03-14 NOTE — Telephone Encounter (Signed)
Given concerns with medication adherence and follow-up, I think it is reasonable to defer restarting spironolactone and amiodarone until he follows up with [place].  Thanks.  [name], MD [facility]

## 2019-03-14 NOTE — Telephone Encounter (Signed)
Patient has virtual visit in the morning with [name], PA.

## 2019-03-14 NOTE — Telephone Encounter (Signed)
Thank you Dr. Okey Dupre. I agree.

## 2019-03-14 NOTE — Progress Notes (Deleted)
Patient: Sajan Cheatwood Male    DOB: 05-27-1960   59 y.o.   MRN: [id number] Visit Date: 03/14/2019  Today's Provider: Marcille Buffy, FNP   Chief Complaint  Patient presents with  . Gout   Subjective:    Virtual Visit via Video Note  I connected with [name] on 03/14/19 at  8:20 AM EST by a video enabled telemedicine application and verified that I am speaking with the correct person using two identifiers.  Location: Patient: *** Provider: ***   I discussed the limitations of evaluation and management by telemedicine and the availability of in person appointments. The patient expressed understanding and agreed to proceed.    I discussed the assessment and treatment plan with the patient. The patient was provided an opportunity to ask questions and all were answered. The patient agreed with the plan and demonstrated an understanding of the instructions.   The patient was advised to call back or seek an in-person evaluation if the symptoms worsen or if the condition fails to improve as anticipated.    Hand Pain  The incident occurred more than 1 week ago. There was no injury mechanism (patient reports swelling in both hands). The pain is present in the right hand and left hand. The quality of the pain is described as aching and stabbing. The pain does not radiate. Pertinent negatives include no chest pain, muscle weakness, numbness or tingling. The symptoms are aggravated by movement. Treatments tried: Allopurinol. The treatment provided no relief.  Patient reports that he has a history of Gout.   No Known Allergies   Current Outpatient Medications:  .  albuterol (VENTOLIN HFA) 108 (90 Base) MCG/ACT inhaler, Inhale 2 puffs into the lungs every 6 (six) hours as needed for wheezing or shortness of breath., Disp: 1 Inhaler, Rfl: 3 .  allopurinol (ZYLOPRIM) 100 MG tablet, Take 1 tablet (100 mg total) by mouth daily., Disp: 30 tablet, Rfl: 0 .  amiodarone  (PACERONE) 200 MG tablet, Take 1 tablet (200 mg total) by mouth daily., Disp: 30 tablet, Rfl: 3 .  atorvastatin (LIPITOR) 40 MG tablet, TAKE 1 TABLET BY MOUTH ONCE DAILY, Disp: 30 tablet, Rfl: 2 .  DULoxetine (CYMBALTA) 20 MG capsule, Take 20 mg by mouth daily., Disp: , Rfl:  .  ferrous sulfate 325 (65 FE) MG tablet, Take 1 tablet (325 mg total) by mouth 2 (two) times daily with a meal., Disp: 60 tablet, Rfl: 1 .  levothyroxine (SYNTHROID) 50 MCG tablet, Take 1 tablet (50 mcg total) by mouth daily before breakfast. Need lab repeat in 3 weeks from 03/08/19, Disp: 30 tablet, Rfl: 1 .  lidocaine (LIDODERM) 5 %, Place 1 patch onto the skin daily. Remove & Discard patch within 12 hours or as directed by MD, Disp: 30 patch, Rfl: 0 .  lisinopril (ZESTRIL) 2.5 MG tablet, TAKE 1 TABLET BY MOUTH ONCE DAILY, Disp: 30 tablet, Rfl: 0 .  metoprolol succinate (TOPROL-XL) 50 MG 24 hr tablet, Take 1 tablet (50 mg total) by mouth 2 (two) times daily. Take with or immediately following a meal., Disp: 30 tablet, Rfl: 1 .  nitroGLYCERIN (NITROSTAT) 0.4 MG SL tablet, Place 1 tablet under tongue every 5 minutes as needed for chest pain. (No more than 3 doses within 15 minutes), Disp: 15 tablet, Rfl: 3 .  nystatin-triamcinolone ointment (MYCOLOG), Apply 1 application topically 2 (two) times daily. (Patient not taking: Reported on 03/04/2019), Disp: 30 g, Rfl: 0 .  spironolactone (ALDACTONE)  25 MG tablet, TAKE 1 TABLET BY MOUTH ONCE DAILY (Patient taking differently: Take 25 mg by mouth daily. ), Disp: 30 tablet, Rfl: 3 .  torsemide (DEMADEX) 20 MG tablet, Take 3 tablets (60 mg total) by mouth 2 (two) times daily., Disp: 180 tablet, Rfl: 2 .  traZODone (DESYREL) 150 MG tablet, TAKE 1 TABLET BY MOUTH AT BEDTIME AS NEEDED SLEEP, Disp: 30 tablet, Rfl: 2 .  warfarin (COUMADIN) 5 MG tablet, Take by mouth daily as directed by the coumadin clinic (Patient taking differently: Take 5-10 mg by mouth daily at 6 PM. Take by mouth daily as  directed by the coumadin clinic), Disp: 140 tablet, Rfl: 0  Review of Systems  Cardiovascular: Negative for chest pain.  Neurological: Negative for tingling and numbness.    Social History   Tobacco Use  . Smoking status: Never Smoker  . Smokeless tobacco: Never Used  Substance Use Topics  . Alcohol use: Yes      Objective:   There were no vitals taken for this visit. There were no vitals filed for this visit.There is no height or weight on file to calculate BMI.   Physical Exam   No results found for any visits on 03/15/19.     Assessment & Plan        I provided *** minutes of non-face-to-face time during this encounter.  Jairo Ben, FNP  [facility]

## 2019-03-14 NOTE — Telephone Encounter (Signed)
PCP did not refill spironolactone, deferred to cardiology as he was treated in ER for fluid overload and on high dose Torosemide. Previous PCP Olevia Perches is last refill provider sees in system. Amiodarone was also deferred to cardiology as home heath reported he had not taken any since June. He was advised to keep his follow up with Dr. Okey Dupre.

## 2019-03-14 NOTE — Telephone Encounter (Signed)
Virtual appt scheduled for tomorrow morning at 8:20AM. [name] [name]

## 2019-03-15 ENCOUNTER — Other Ambulatory Visit: Payer: Self-pay

## 2019-03-15 ENCOUNTER — Telehealth (INDEPENDENT_AMBULATORY_CARE_PROVIDER_SITE_OTHER): Payer: Medicaid Other | Admitting: Physician Assistant

## 2019-03-15 ENCOUNTER — Telehealth (INDEPENDENT_AMBULATORY_CARE_PROVIDER_SITE_OTHER): Payer: Medicaid Other | Admitting: Adult Health

## 2019-03-15 ENCOUNTER — Encounter: Payer: Self-pay | Admitting: Physician Assistant

## 2019-03-15 VITALS — Ht 68.0 in | Wt >= 6400 oz

## 2019-03-15 DIAGNOSIS — Z86711 Personal history of pulmonary embolism: Secondary | ICD-10-CM | POA: Diagnosis not present

## 2019-03-15 DIAGNOSIS — I110 Hypertensive heart disease with heart failure: Secondary | ICD-10-CM | POA: Diagnosis not present

## 2019-03-15 DIAGNOSIS — Z7901 Long term (current) use of anticoagulants: Secondary | ICD-10-CM

## 2019-03-15 DIAGNOSIS — I5042 Chronic combined systolic (congestive) and diastolic (congestive) heart failure: Secondary | ICD-10-CM

## 2019-03-15 DIAGNOSIS — Z86718 Personal history of other venous thrombosis and embolism: Secondary | ICD-10-CM

## 2019-03-15 DIAGNOSIS — I4819 Other persistent atrial fibrillation: Secondary | ICD-10-CM | POA: Diagnosis not present

## 2019-03-15 DIAGNOSIS — I10 Essential (primary) hypertension: Secondary | ICD-10-CM

## 2019-03-15 DIAGNOSIS — E785 Hyperlipidemia, unspecified: Secondary | ICD-10-CM | POA: Diagnosis not present

## 2019-03-15 DIAGNOSIS — Z5329 Procedure and treatment not carried out because of patient's decision for other reasons: Secondary | ICD-10-CM

## 2019-03-15 DIAGNOSIS — I872 Venous insufficiency (chronic) (peripheral): Secondary | ICD-10-CM | POA: Diagnosis not present

## 2019-03-15 NOTE — Patient Instructions (Signed)
Medication Instructions:  None ordered  *If you need a refill on your cardiac medications before your next appointment, please call your pharmacy*  Lab Work: None ordered If you have labs (blood work) drawn today and your tests are completely normal, you will receive your results only by: [name] MyChart Message (if you have MyChart) OR . A paper copy in the mail If you have any lab test that is abnormal or we need to change your treatment, we will call you to review the results.  Testing/Procedures: None ordered  Follow-Up: At [facility], you and your health needs are our priority.  As part of our continuing mission to provide you with exceptional heart care, we have created designated Provider Care Teams.  These Care Teams include your primary Cardiologist (physician) and Advanced Practice Providers (APPs -  Physician Assistants and Nurse Practitioners) who all work together to provide you with the care you need, when you need it.  Your next appointment:   1 month(s)  The format for your next appointment:   In Person  Provider:    You may see [name], MD or one of the following Advanced Practice Providers on your designated Care Team:    Nicolasa Ducking, NP  [name], PA-C  Marisue Ivan, PA-C   Other Instructions NA

## 2019-03-16 ENCOUNTER — Telehealth: Payer: Self-pay | Admitting: Internal Medicine

## 2019-03-16 NOTE — Telephone Encounter (Signed)
Pt c/o medication issue:  1. Name of Medication: spironolactone  2. How are you currently taking this medication (dosage and times per day)? 25 mg daily  3. Are you having a reaction (difficulty breathing--STAT)?   4. What is your medication issue? See below   [facility] calling in after receiving fax stating Dr. Okey Dupre said to stop taking medication after receiving commication with community care. Tobi Bastos fills like there might have been some miscommunication between this medication and the providers  Please advise Tobi Bastos at [phone]

## 2019-03-16 NOTE — Telephone Encounter (Signed)
Until we can actually lay our eyes on what pills he is actually taking we cannot accurately make recommendations with regards to his medications.  Given pharmacy has confirmed they have not been filling amiodarone or spironolactone we will continue to recommend these medications be deferred.  Overall, this is a difficult situation given patient's refusal for in person visit.  Based on his chronically subtherapeutic INR there are concerns that even though he may have pills at home he is not taking them.  We will await findings from the paramedicine service with further recommendations at that time.  Please remove amiodarone and spironolactone from his medication list.

## 2019-03-16 NOTE — Progress Notes (Signed)
Patient refused to come to the office and refused video visit. Not appropriate for telephone visit given his history of recent documented fluid overload and his report of gout in all four extremities as he was in fluid overload at recent ER visit. Patient continues to request narcotic pain medication, he went to orthopedics for hip arthritis as well 03/14/2019. Advised not safe no narcotics will be given. Needs virtual or in person visit. Needs labs as well,refuses to come to office. Has transportation visit. Refusing to be seen in person by cardiology as recommended. Patient advised to seek emergency care should any symptoms worsen at anytime.

## 2019-03-16 NOTE — Telephone Encounter (Signed)
Med list updated. Spoke with Tobi Bastos and she is aware of the situation and will await for the paramedicine service to see patient and review his meds.  Updated med list sent to [facility] as well.

## 2019-03-16 NOTE — Telephone Encounter (Signed)
Called [name] [name]. She is Pharmacist working with Medicaid services and was asked to help determine what medications patient is supposed to be taking. [facility] is wanting to help patient receive the correct packaging of medications to help with his adherence.  She confirmed patient has not been taking spironolactone or amiodarone as of lately. We reviewed virtual visit note from [name], [place] yesterday. Advised I would route to Ryan to confirm which medications patient should be on and then update the med list as advised. She was very appreciative and is glad to help the patient get his medications right.

## 2019-03-17 ENCOUNTER — Other Ambulatory Visit: Payer: Self-pay | Admitting: Adult Health

## 2019-03-19 ENCOUNTER — Telehealth: Payer: Self-pay | Admitting: Internal Medicine

## 2019-03-19 DIAGNOSIS — Z4800 Encounter for change or removal of nonsurgical wound dressing: Secondary | ICD-10-CM | POA: Diagnosis not present

## 2019-03-19 DIAGNOSIS — G4733 Obstructive sleep apnea (adult) (pediatric): Secondary | ICD-10-CM | POA: Diagnosis not present

## 2019-03-19 NOTE — Telephone Encounter (Signed)
Attempted to schedule.  LMOV to call office.  1 m fu per checkout 03/15/19 [facility]

## 2019-03-20 ENCOUNTER — Other Ambulatory Visit: Payer: Self-pay

## 2019-03-20 ENCOUNTER — Ambulatory Visit: Payer: Medicaid Other

## 2019-03-20 NOTE — Chronic Care Management (AMB) (Signed)
  Care Management   Note  03/20/2019 Name: [name] [name] MRN: [id number] DOB: 10-Sep-1960   Brief outreach with [name]. Reports medical technician was assisting him in the home at the time of the call. Requested call within the next week. Prefers afternoon outreach.  PLAN The care management team will reach out to [name] on 03/27/19. He was encouraged to call if needed prior to the scheduled follow-up.    Katha Cabal [facility] Care Management [phone]

## 2019-03-21 ENCOUNTER — Ambulatory Visit (INDEPENDENT_AMBULATORY_CARE_PROVIDER_SITE_OTHER): Payer: Medicaid Other

## 2019-03-21 DIAGNOSIS — Z86718 Personal history of other venous thrombosis and embolism: Secondary | ICD-10-CM | POA: Diagnosis not present

## 2019-03-21 DIAGNOSIS — Z4800 Encounter for change or removal of nonsurgical wound dressing: Secondary | ICD-10-CM | POA: Diagnosis not present

## 2019-03-21 DIAGNOSIS — I2699 Other pulmonary embolism without acute cor pulmonale: Secondary | ICD-10-CM

## 2019-03-21 DIAGNOSIS — Z7901 Long term (current) use of anticoagulants: Secondary | ICD-10-CM | POA: Diagnosis not present

## 2019-03-21 DIAGNOSIS — I482 Chronic atrial fibrillation, unspecified: Secondary | ICD-10-CM

## 2019-03-21 LAB — POCT INR: INR: 1.3 — AB (ref 2.0–3.0)

## 2019-03-21 NOTE — Patient Instructions (Signed)
Spoke with Misty Stanley, [facility] RN w/ [facility] and advised her to have patient take 3 tablets warfarin today & tomorrow, then start taking 2 tablets every day.  Recheck INR in 1 week-Wednesday.

## 2019-03-22 ENCOUNTER — Encounter: Payer: Self-pay | Admitting: Adult Health

## 2019-03-22 ENCOUNTER — Other Ambulatory Visit: Payer: Self-pay

## 2019-03-22 ENCOUNTER — Ambulatory Visit (INDEPENDENT_AMBULATORY_CARE_PROVIDER_SITE_OTHER): Payer: Medicaid Other | Admitting: Adult Health

## 2019-03-22 DIAGNOSIS — M1A9XX0 Chronic gout, unspecified, without tophus (tophi): Secondary | ICD-10-CM | POA: Diagnosis not present

## 2019-03-22 DIAGNOSIS — I4819 Other persistent atrial fibrillation: Secondary | ICD-10-CM

## 2019-03-22 DIAGNOSIS — Z125 Encounter for screening for malignant neoplasm of prostate: Secondary | ICD-10-CM | POA: Diagnosis not present

## 2019-03-22 DIAGNOSIS — I5022 Chronic systolic (congestive) heart failure: Secondary | ICD-10-CM

## 2019-03-22 DIAGNOSIS — I2699 Other pulmonary embolism without acute cor pulmonale: Secondary | ICD-10-CM | POA: Diagnosis not present

## 2019-03-22 DIAGNOSIS — J410 Simple chronic bronchitis: Secondary | ICD-10-CM | POA: Diagnosis not present

## 2019-03-22 DIAGNOSIS — E119 Type 2 diabetes mellitus without complications: Secondary | ICD-10-CM

## 2019-03-22 DIAGNOSIS — M25551 Pain in right hip: Secondary | ICD-10-CM | POA: Diagnosis not present

## 2019-03-22 MED ORDER — TRAZODONE HCL 150 MG PO TABS: ORAL_TABLET | ORAL | 0 refills | Status: DC

## 2019-03-22 MED ORDER — LISINOPRIL 2.5 MG PO TABS: 2.5000 mg | ORAL_TABLET | Freq: Every day | ORAL | 0 refills | Status: DC

## 2019-03-22 NOTE — Patient Instructions (Signed)
Heart Failure, Self Care Heart failure is a serious condition. This sheet explains things you need to do to take care of yourself at home. To help you stay as healthy as possible, you may be asked to change your diet, take certain medicines, and make other changes in your life. Your doctor may also give you more specific instructions. If you have problems or questions, call your doctor. What are the risks? Having heart failure makes it more likely for you to have some problems. These problems can get worse if you do not take good care of yourself. Problems may include:  Blood clotting problems. This may cause a stroke.  Damage to the kidneys, liver, or lungs.  Abnormal heart rhythms. Supplies needed:  Scale for weighing yourself.  Blood pressure monitor.  Notebook.  Medicines. How to care for yourself when you have heart failure Medicines Take over-the-counter and prescription medicines only as told by your doctor. Take your medicines every day.  Do not stop taking your medicine unless your doctor tells you to do so.  Do not skip any medicines.  Get your prescriptions refilled before you run out of medicine. This is important. Eating and drinking   Eat heart-healthy foods. Talk with a diet specialist (dietitian) to create an eating plan.  Choose foods that: ? Have no trans fat. ? Are low in saturated fat and cholesterol.  Choose healthy foods, such as: ? Fresh or frozen fruits and vegetables. ? Fish. ? Low-fat (lean) meats. ? Legumes, such as beans, peas, and lentils. ? Fat-free or low-fat dairy products. ? Whole-grain foods. ? High-fiber foods.  Limit salt (sodium) if told by your doctor. Ask your diet specialist to tell you which seasonings are healthy for your heart.  Cook in healthy ways instead of frying. Healthy ways of cooking include roasting, grilling, broiling, baking, poaching, steaming, and stir-frying.  Limit how much fluid you drink, if told by your  doctor. Alcohol use  Do not drink alcohol if: ? Your doctor tells you not to drink. ? Your heart was damaged by alcohol, or you have very bad heart failure. ? You are pregnant, may be pregnant, or are planning to become pregnant.  If you drink alcohol: ? Limit how much you use to:  0-1 drink a day for women.  0-2 drinks a day for men. ? Be aware of how much alcohol is in your drink. In the [place] one drink equals one 12 oz bottle of beer (355 mL), one 5 oz glass of wine (148 mL), or one 1 oz glass of hard liquor (44 mL). Lifestyle   Do not use any products that contain nicotine or tobacco, such as cigarettes, e-cigarettes, and chewing tobacco. If you need help quitting, ask your doctor. ? Do not use nicotine gum or patches before talking to your doctor.  Do not use illegal drugs.  Lose weight if told by your doctor.  Do physical activity if told by your doctor. Talk to your doctor before you begin an exercise if: ? You are an older adult. ? You have very bad heart failure.  Learn to manage stress. If you need help, ask your doctor.  Get rehab (rehabilitation) to help you stay independent and to help with your quality of life.  Plan time to rest when you get tired. Check weight and blood pressure   Weigh yourself every day. This will help you to know if fluid is building up in your body. ? Weigh yourself every morning   after you pee (urinate) and before you eat breakfast. ? Wear the same amount of clothing each time. ? Write down your daily weight. Give your record to your doctor.  Check and write down your blood pressure as told by your doctor.  Check your pulse as told by your doctor. Dealing with very hot and very cold weather  If it is very hot: ? Avoid activities that take a lot of energy. ? Use air conditioning or fans, or find a cooler place. ? Avoid caffeine and alcohol. ? Wear clothing that is loose-fitting, lightweight, and light-colored.  If it is very  cold: ? Avoid activities that take a lot of energy. ? Layer your clothes. ? Wear mittens or gloves, a hat, and a scarf when you go outside. ? Avoid alcohol. Follow these instructions at home:  Stay up to date with shots (vaccines). Get pneumococcal and flu (influenza) shots.  Keep all follow-up visits as told by your doctor. This is important. Contact a doctor if:  You gain weight quickly.  You have increasing shortness of breath.  You cannot do your normal activities.  You get tired easily.  You cough a lot.  You don't feel like eating or feel like you may vomit (nauseous).  You become puffy (swell) in your hands, feet, ankles, or belly (abdomen).  You cannot sleep well because it is hard to breathe.  You feel like your heart is beating fast (palpitations).  You get dizzy when you stand up. Get help right away if:  You have trouble breathing.  You or someone else notices a change in your behavior, such as having trouble staying awake.  You have chest pain or discomfort.  You pass out (faint). These symptoms may be an emergency. Do not wait to see if the symptoms will go away. Get medical help right away. Call your local emergency services (911 in the [place] Do not drive yourself to the hospital. Summary  Heart failure is a serious condition. To care for yourself, you may have to change your diet, take medicines, and make other lifestyle changes.  Take your medicines every day. Do not stop taking them unless your doctor tells you to do so.  Eat heart-healthy foods, such as fresh or frozen fruits and vegetables, fish, lean meats, legumes, fat-free or low-fat dairy products, and whole-grain or high-fiber foods.  Ask your doctor if you can drink alcohol. You may have to stop alcohol use if you have very bad heart failure.  Contact your doctor if you gain weight quickly or feel that your heart is beating too fast. Get help right away if you pass out, or have chest pain  or trouble breathing. This information is not intended to replace advice given to you by your health care provider. Make sure you discuss any questions you have with your health care provider. Document Revised: 05/16/2018 Document Reviewed: 05/17/2018 Elsevier Patient Education  2020 Elsevier Inc.   Heart Failure, Diagnosis  Heart failure means that your heart is not able to pump blood in the right way. This makes it hard for your body to work well. Heart failure is usually a long-term (chronic) condition. You must take good care of yourself and follow your treatment plan from your doctor. What are the causes? This condition may be caused by:  High blood pressure.  Build up of cholesterol and fat in the arteries.  Heart attack. This injures the heart muscle.  Heart valves that do not open and close   properly.  Damage of the heart muscle. This is also called cardiomyopathy.  Lung disease.  Abnormal heart rhythms. What increases the risk? The risk of heart failure goes up as a person ages. This condition is also more likely to develop in people who:  Are overweight.  Are male.  Smoke or chew tobacco.  Abuse alcohol or illegal drugs.  Have taken medicines that can damage the heart.  Have diabetes.  Have abnormal heart rhythms.  Have thyroid problems.  Have low blood counts (anemia). What are the signs or symptoms? Symptoms of this condition include:  Shortness of breath.  Coughing.  Swelling of the feet, ankles, legs, or belly.  Losing weight for no reason.  Trouble breathing.  Waking from sleep because of the need to sit up and get more air.  Rapid heartbeat.  Being very tired.  Feeling dizzy, or feeling like you may pass out (faint).  Having no desire to eat.  Feeling like you may vomit (nauseous).  Peeing (urinating) more at night.  Feeling confused. How is this treated?     This condition may be treated with:  Medicines. These can be  given to treat blood pressure and to make the heart muscles stronger.  Changes in your daily life. These may include eating a healthy diet, staying at a healthy body weight, quitting tobacco and illegal drug use, or doing exercises.  Surgery. Surgery can be done to open blocked valves, or to put devices in the heart, such as pacemakers.  A donor heart (heart transplant). You will receive a healthy heart from a donor. Follow these instructions at home:  Treat other conditions as told by your doctor. These may include high blood pressure, diabetes, thyroid disease, or abnormal heart rhythms.  Learn as much as you can about heart failure.  Get support as you need it.  Keep all follow-up visits as told by your doctor. This is important. Summary  Heart failure means that your heart is not able to pump blood in the right way.  This condition is caused by high blood pressure, heart attack, or damage of the heart muscle.  Symptoms of this condition include shortness of breath and swelling of the feet, ankles, legs, or belly. You may also feel very tired or feel like you may vomit.  You may be treated with medicines, surgery, or changes in your daily life.  Treat other health conditions as told by your doctor. This information is not intended to replace advice given to you by your health care provider. Make sure you discuss any questions you have with your health care provider. Document Revised: 04/21/2018 Document Reviewed: 04/21/2018 Elsevier Patient Education  2020 Elsevier Inc.  

## 2019-03-22 NOTE — Progress Notes (Signed)
Patient: [name] [name] Male    DOB: Jul 14, 1960   59 y.o.   MRN: [id number] Visit Date: 03/22/2019  Today's Provider: Jairo Ben, FNP   Chief Complaint  Patient presents with  . Edema   Subjective:     HPI Patient comes in office today with concerns of gout in both hands and now right foot. Patient states that he has had pain and swelling in both hands for the past six weeks, he has been taking prescription Allopurinol but has not noticed any improvement.    He is currently having wound care done by [facility] home health care he reports.  He has bilateral Unna wraps on his feet and lower legs due to wounds.  He was offered the wound clinic again today and previously but declines.  He prefers to have home health do this.  Denies any changing, other than the wounds are improving.  He has some mild shortness of breath with exertion, and at rest at times.  He does have notable swelling in bilateral hands, and bilateral feet.  He does report that his joints are sore in his left hand with his pointer and middle finger only.   He reports he missed cardiology appointment last week with Dr. Okey Dupre.  He is aware that he should call and reschedule this follow-up as soon as possible given his history.    No Known Allergies   Current Outpatient Medications:  .  albuterol (VENTOLIN HFA) 108 (90 Base) MCG/ACT inhaler, Inhale 2 puffs into the lungs every 6 (six) hours as needed for wheezing or shortness of breath., Disp: 1 Inhaler, Rfl: 3 .  allopurinol (ZYLOPRIM) 100 MG tablet, Take 1 tablet (100 mg total) by mouth daily., Disp: 30 tablet, Rfl: 0 .  atorvastatin (LIPITOR) 40 MG tablet, TAKE 1 TABLET BY MOUTH ONCE DAILY, Disp: 30 tablet, Rfl: 2 .  DULoxetine (CYMBALTA) 20 MG capsule, Take 20 mg by mouth daily., Disp: , Rfl:  .  levothyroxine (SYNTHROID) 50 MCG tablet, Take 1 tablet (50 mcg total) by mouth daily before breakfast. Need lab repeat in 3 weeks from 03/08/19, Disp: 30  tablet, Rfl: 1 .  lisinopril (ZESTRIL) 2.5 MG tablet, TAKE 1 TABLET BY MOUTH ONCE DAILY, Disp: 30 tablet, Rfl: 0 .  metoprolol succinate (TOPROL-XL) 50 MG 24 hr tablet, Take 1 tablet (50 mg total) by mouth 2 (two) times daily. Take with or immediately following a meal., Disp: 30 tablet, Rfl: 1 .  nitroGLYCERIN (NITROSTAT) 0.4 MG SL tablet, Place 1 tablet under tongue every 5 minutes as needed for chest pain. (No more than 3 doses within 15 minutes), Disp: 15 tablet, Rfl: 3 .  torsemide (DEMADEX) 20 MG tablet, Take 3 tablets (60 mg total) by mouth 2 (two) times daily., Disp: 180 tablet, Rfl: 2 .  traZODone (DESYREL) 150 MG tablet, TAKE 1 TABLET BY MOUTH AT BEDTIME AS NEEDED SLEEP, Disp: 30 tablet, Rfl: 2 .  warfarin (COUMADIN) 5 MG tablet, Take by mouth daily as directed by the coumadin clinic (Patient taking differently: Take 5-10 mg by mouth daily at 6 PM. Take by mouth daily as directed by the coumadin clinic), Disp: 140 tablet, Rfl: 0 .  ferrous sulfate 325 (65 FE) MG tablet, Take 1 tablet (325 mg total) by mouth 2 (two) times daily with a meal. (Patient not taking: Reported on 03/15/2019), Disp: 60 tablet, Rfl: 1  Review of Systems  Constitutional: Positive for fatigue.  Gastrointestinal: Negative.   Musculoskeletal:  Positive for arthralgias, back pain, joint swelling and myalgias.    Social History   Tobacco Use  . Smoking status: Never Smoker  . Smokeless tobacco: Never Used  Substance Use Topics  . Alcohol use: Yes      Objective:   BP 132/82   Pulse 67   Temp (!) 97.5 F (36.4 C) (Oral)   Resp 16   SpO2 99%  Vitals:   03/22/19 1600  BP: 132/82  Pulse: 67  Resp: 16  Temp: (!) 97.5 F (36.4 C)  TempSrc: Oral  SpO2: 99%  There is no height or weight on file to calculate BMI.   Physical Exam Vitals and nursing note reviewed.  Constitutional:      General: He is not in acute distress.    Appearance: Normal appearance. He is not ill-appearing, toxic-appearing or  diaphoretic.  HENT:     Head: Normocephalic and atraumatic.     Jaw: There is normal jaw occlusion.     Right Ear: External ear normal.     Left Ear: External ear normal.     Nose: Nose normal.     Mouth/Throat:     Mouth: Mucous membranes are moist.     Pharynx: Oropharynx is clear.  Eyes:     General: No scleral icterus.       Right eye: No discharge.        Left eye: No discharge.     Extraocular Movements: Extraocular movements intact.     Conjunctiva/sclera: Conjunctivae normal.     Pupils: Pupils are equal, round, and reactive to light.  Neck:     Vascular: No hepatojugular reflux or JVD.     Trachea: Trachea normal. No tracheal tenderness.  Cardiovascular:     Rate and Rhythm: Normal rate and regular rhythm.     Chest Wall: PMI is not displaced.     Pulses:          Radial pulses are 1+ on the right side and 1+ on the left side.       Dorsalis pedis pulses are 1+ on the right side and 1+ on the left side.     Heart sounds: Normal heart sounds. No murmur. No friction rub. No gallop.   Pulmonary:     Effort: Pulmonary effort is normal. No respiratory distress.     Breath sounds: Normal air entry. No stridor, decreased air movement or transmitted upper airway sounds. Examination of the right-lower field reveals rhonchi. Examination of the left-lower field reveals rhonchi. Rhonchi present. No decreased breath sounds, wheezing or rales.     Comments: Scattered rhonchi bilateral lower lobes Chest:     Chest wall: No tenderness.  Musculoskeletal:        General: Normal range of motion.     Right wrist: Normal.     Left wrist: Normal.     Right hand: Swelling present. No deformity, lacerations, tenderness or bony tenderness. Normal range of motion. Normal capillary refill. Normal pulse.     Left hand: Swelling, deformity (tophi at distal joint at pointer and middle finger, no wamth or erythema. ) and tenderness present. No lacerations. Normal range of motion. Normal capillary  refill. Normal pulse.     Cervical back: Normal range of motion and neck supple.     Right lower leg: 2+ Pitting Edema present.     Left lower leg: 2+ Pitting Edema present.  Skin:    General: Skin is warm and dry.  Capillary Refill: Capillary refill takes less than 2 seconds.     Coloration: Skin is not jaundiced or pale.     Findings: No bruising, erythema, lesion or rash.     Comments: Lateral wraps on lower legs, these were not removed today.  Patient reports that his home health nurses just remove them and he is having them cared for at home.  Provider recommends wound clinic for patient giving history of this.  2+ bilateral pedal edema.  Neurological:     General: No focal deficit present.     Mental Status: He is alert and oriented to person, place, and time. Mental status is at baseline.  Psychiatric:        Mood and Affect: Mood normal.        Behavior: Behavior normal. Behavior is cooperative.        Thought Content: Thought content normal.        Judgment: Judgment normal.      No results found for any visits on 03/22/19.     Assessment & Plan     Morbid obesity ([place]) - Plan: Ambulatory referral to Pain Clinic  Diet-controlled diabetes mellitus ([place]) - Plan: CBC with Differential/Platelet, Comprehensive Metabolic Panel (CMET), HgB A1c, Ambulatory referral to Pain Clinic  Right hip pain - Plan: CBC with Differential/Platelet, Comprehensive Metabolic Panel (CMET), Ambulatory referral to Pain Clinic  Chronic systolic heart failure ([place]) - Plan: B Nat Peptide, Lipid Panel w/o Chol/HDL Ratio, TSH, DG Chest 2 View, Ambulatory referral to Pain Clinic  Chronic gout without tophus, unspecified cause, unspecified site - Plan: B Nat Peptide, CBC with Differential/Platelet, Uric acid, Ambulatory referral to Pain Clinic  Screening for malignant neoplasm of prostate - Plan: PSA, Ambulatory referral to Pain Clinic  . Orders Placed This Encounter  Procedures  . DG Chest 2  View  . B Nat Peptide  . CBC with Differential/Platelet  . Comprehensive Metabolic Panel (CMET)  . PSA  . Lipid Panel w/o Chol/HDL Ratio  . Uric acid  . HgB A1c  . TSH  . Ambulatory referral to Pain Clinic     he requested these refills.  Concerned for fluid over load with continued edema. He needs to follow-up with cardiology in person as well.  I ordered a chest x-ray today he should go for that since possible.  Was also ordered.  He agrees to go for labs.  He agrees to follow back up with me within a week.  We gust at length his need for care.  He would like to go to the pain clinic for his right hip pain, he is not a surgical candidate at this time, bariatric clinic was recommended by orthopedic physician, will place this order, although he knows he may not be a surgical candidate he can get some nutritional information and try to lose weight given his morbid obesity.  Reports he has 5 mg oxycodone but that does not help his hip pain.  Referral was placed for the pain clinic.  Given his multiple morbidities and chronic history, he needs close follow-up and surveillance..  Medicine services are trying to get involved at home, as patient is not consistent with taking his medications.  We will do labs fasting and review and then proceed with a treatment plan after chest x-ray.   Meds ordered this encounter  Medications  . lisinopril (ZESTRIL) 2.5 MG tablet    Sig: Take 1 tablet (2.5 mg total) by mouth daily.    Dispense:  30 tablet    Refill:  0  . traZODone (DESYREL) 150 MG tablet    Sig: TAKE 1 TABLET BY MOUTH AT BEDTIME AS NEEDED SLEEP    Dispense:  30 tablet    Refill:  0     Return in about 1 month (around 04/19/2019), or if symptoms worsen or fail to improve, for at any time for any worsening symptoms, Go to Emergency room/ urgent care if worse. The entirety of the information documented in the History of Present Illness, Review of Systems and Physical Exam were personally  obtained by me. Portions of this information were initially documented by the  Certified Medical Assistant whose name is documented in Epic and reviewed by me for thoroughness and accuracy.  I have personally performed the exam and reviewed the chart and it is accurate to the best of my knowledge.  Museum/gallery conservator has been used and any errors in dictation or transcription are unintentional.  [name]. Flinchum FNP-C  [facility]  Jairo Ben, FNP  [facility]

## 2019-03-23 ENCOUNTER — Telehealth: Payer: Self-pay

## 2019-03-23 DIAGNOSIS — M25551 Pain in right hip: Secondary | ICD-10-CM | POA: Diagnosis not present

## 2019-03-23 DIAGNOSIS — I5022 Chronic systolic (congestive) heart failure: Secondary | ICD-10-CM | POA: Diagnosis not present

## 2019-03-23 DIAGNOSIS — M1A9XX0 Chronic gout, unspecified, without tophus (tophi): Secondary | ICD-10-CM | POA: Diagnosis not present

## 2019-03-23 DIAGNOSIS — E119 Type 2 diabetes mellitus without complications: Secondary | ICD-10-CM | POA: Diagnosis not present

## 2019-03-23 DIAGNOSIS — Z4800 Encounter for change or removal of nonsurgical wound dressing: Secondary | ICD-10-CM | POA: Diagnosis not present

## 2019-03-23 DIAGNOSIS — Z125 Encounter for screening for malignant neoplasm of prostate: Secondary | ICD-10-CM | POA: Diagnosis not present

## 2019-03-23 NOTE — Telephone Encounter (Signed)
That is great news we will wait for those results.  Thank you

## 2019-03-23 NOTE — Telephone Encounter (Signed)
Copied from CRM [id number]. Topic: General - Other >> Mar 23, 2019 12:58 PM Jaquita Rector A wrote: Reason for CRM: Patient called to ask that lab orders are sent to [facility] so that his nurse Misty Stanley can get the blood when she come for the visit today. Also asking for a call back at Ph#  [phone]

## 2019-03-23 NOTE — Telephone Encounter (Signed)
Called and spoke with patient, he stats home health nurse was at his house now and was drawing labs your ordered yesterday. KW

## 2019-03-26 ENCOUNTER — Telehealth: Payer: Self-pay

## 2019-03-26 DIAGNOSIS — Z4800 Encounter for change or removal of nonsurgical wound dressing: Secondary | ICD-10-CM | POA: Diagnosis not present

## 2019-03-26 NOTE — Telephone Encounter (Signed)
Copied from CRM [id number]. Topic: General - Inquiry >> Mar 26, 2019  9:57 AM Deborha Payment wrote: Reason for CRM: Patient is requesting medication for his gout.  He has a break out in his hands and toes. Patient states he had already spoken with PCP regarding. Call back [phone] PHARMACY [facility]

## 2019-03-26 NOTE — Telephone Encounter (Signed)
I advised patient again that we can not fill medication unless we have results back from his labs because patient had been retaining to much fluid at the time we need to know if his uric acid level was elevated. I informed patient that once we get labs in we will call him back. KW

## 2019-03-26 NOTE — Telephone Encounter (Signed)
Correct. He reported they were drawn Friday hopefully we will get results soon. UC/ ED if any symptoms severe.

## 2019-03-27 ENCOUNTER — Telehealth: Payer: Self-pay

## 2019-03-27 ENCOUNTER — Ambulatory Visit: Payer: Self-pay

## 2019-03-27 NOTE — Chronic Care Management (AMB) (Signed)
  Chronic Care Management   Outreach Note  03/27/2019 Name: [name] [name] MRN: [id number] DOB: 1960/12/19  Referred by: Berniece Pap, FNP Reason for referral : Chronic Care Management   An unsuccessful telephone outreach was attempted today. [name] [name] was referred to the case management team for assistance with care management and care coordination.    Follow Up Plan: The care management team will follow up with [name] [name] again within the next two weeks.    Katha Cabal [facility] Care Management [phone]

## 2019-03-28 ENCOUNTER — Ambulatory Visit (INDEPENDENT_AMBULATORY_CARE_PROVIDER_SITE_OTHER): Payer: Medicaid Other

## 2019-03-28 ENCOUNTER — Telehealth: Payer: Self-pay | Admitting: Internal Medicine

## 2019-03-28 DIAGNOSIS — I482 Chronic atrial fibrillation, unspecified: Secondary | ICD-10-CM | POA: Diagnosis not present

## 2019-03-28 DIAGNOSIS — Z7901 Long term (current) use of anticoagulants: Secondary | ICD-10-CM

## 2019-03-28 DIAGNOSIS — I2699 Other pulmonary embolism without acute cor pulmonale: Secondary | ICD-10-CM | POA: Diagnosis not present

## 2019-03-28 DIAGNOSIS — Z4800 Encounter for change or removal of nonsurgical wound dressing: Secondary | ICD-10-CM | POA: Diagnosis not present

## 2019-03-28 DIAGNOSIS — Z86718 Personal history of other venous thrombosis and embolism: Secondary | ICD-10-CM

## 2019-03-28 LAB — POCT INR: INR: 2.3 (ref 2.0–3.0)

## 2019-03-28 NOTE — Telephone Encounter (Signed)
Patient started experiencing Numbness and swelling in Left side of face and into Left shoulder .  Wants asap appt to discuss.  Declined same day appt for transportations issues.    Scheduled with caitlin walker on 2-15 at 130

## 2019-03-28 NOTE — Telephone Encounter (Signed)
Went straight to voicemail with his name identified. Ok to leave detailed message.  Asked him to call us back and stressed if he is still having the symptoms he described to scheduler he should call 911 and go to the Emergency room.

## 2019-03-28 NOTE — Patient Instructions (Signed)
Spoke with Misty Stanley, [facility] RN w/ [facility] and advised her to have patient continue warfarin 2 tablets every day.  Recheck INR in 1 week-Wednesday.

## 2019-03-29 ENCOUNTER — Telehealth: Payer: Self-pay

## 2019-03-29 DIAGNOSIS — Z4800 Encounter for change or removal of nonsurgical wound dressing: Secondary | ICD-10-CM | POA: Diagnosis not present

## 2019-03-29 NOTE — Telephone Encounter (Signed)
Patient returning call. He is still having left sided facial numbness, eye swelling and drooling out of the corner of his mouth. His speech did not sound slurred. Advised him that he should go to the ER for evaluation as it sounds like he could have had a stroke. Patient states "Oh I know I had a stroke. I'm sure that's what this is."  Again, advised the ER. Patient does not want to go to the ER. Advised that he should call his PCP to make them aware of this as well and that they may need to see him before 04/02/19 which is his appointment with [place]. Patient said he will call his PCP. Pt verbalized understanding to call 911 or go to the emergency room, if he develops any new or worsening symptoms.

## 2019-03-29 NOTE — Telephone Encounter (Signed)
Left side of face still numb, eye is swollen and lip does not feel right as of this morning. Patient states he does not prefer to go to ER and that he thinks he will be okay until monday

## 2019-03-29 NOTE — Telephone Encounter (Signed)
I routed his labs to you with result note that was faxed to [place]. Uric acid was normal and the BNP that was needed was unfortunately not drawn. They need to draw. CCM was unable to reach patient. He should follow up with orthopedics for his chronic gout for evaluation. I am not convinced the swelling in not from his CHF. Please advise him. I can place an order.

## 2019-03-29 NOTE — Telephone Encounter (Signed)
Copied from CRM [id number]. Topic: General - Inquiry >> Mar 29, 2019 11:42 AM Leary Roca wrote: Reason for CRM: patient called in to receive a phone call from someone in the office . Please advise

## 2019-03-29 NOTE — Telephone Encounter (Signed)
Ok to place BNP order.   Thanks, Marvell Fuller MSN, AGNP-C, FNP-C

## 2019-03-29 NOTE — Telephone Encounter (Signed)
Patient advised and declined ortho evaluation, patient states that ortho told him previously there was nothing they can do for him. Patient stats that he cannot come back to lab and is requesting a new order for BNP be placed in his chart and sent to [facility] so his home health nurse can draw it. Please advise. KW

## 2019-03-30 NOTE — Addendum Note (Signed)
Addended by: Fonda Kinder on: 03/30/2019 04:34 PM   Modules accepted: Orders

## 2019-04-02 ENCOUNTER — Ambulatory Visit: Payer: Medicaid Other | Admitting: Family

## 2019-04-02 ENCOUNTER — Other Ambulatory Visit: Payer: Self-pay

## 2019-04-02 ENCOUNTER — Ambulatory Visit (INDEPENDENT_AMBULATORY_CARE_PROVIDER_SITE_OTHER): Payer: Medicaid Other | Admitting: Family

## 2019-04-02 ENCOUNTER — Encounter: Payer: Self-pay | Admitting: Family

## 2019-04-02 ENCOUNTER — Telehealth: Payer: Self-pay

## 2019-04-02 ENCOUNTER — Ambulatory Visit (INDEPENDENT_AMBULATORY_CARE_PROVIDER_SITE_OTHER): Payer: Medicaid Other

## 2019-04-02 VITALS — BP 122/68 | HR 66 | Ht 68.0 in | Wt >= 6400 oz

## 2019-04-02 DIAGNOSIS — Z5181 Encounter for therapeutic drug level monitoring: Secondary | ICD-10-CM

## 2019-04-02 DIAGNOSIS — I10 Essential (primary) hypertension: Secondary | ICD-10-CM

## 2019-04-02 DIAGNOSIS — Z4800 Encounter for change or removal of nonsurgical wound dressing: Secondary | ICD-10-CM | POA: Diagnosis not present

## 2019-04-02 DIAGNOSIS — G510 Bell's palsy: Secondary | ICD-10-CM

## 2019-04-02 DIAGNOSIS — Z86718 Personal history of other venous thrombosis and embolism: Secondary | ICD-10-CM

## 2019-04-02 DIAGNOSIS — R29810 Facial weakness: Secondary | ICD-10-CM | POA: Diagnosis not present

## 2019-04-02 DIAGNOSIS — Z86711 Personal history of pulmonary embolism: Secondary | ICD-10-CM | POA: Diagnosis not present

## 2019-04-02 DIAGNOSIS — I482 Chronic atrial fibrillation, unspecified: Secondary | ICD-10-CM

## 2019-04-02 DIAGNOSIS — I4819 Other persistent atrial fibrillation: Secondary | ICD-10-CM

## 2019-04-02 DIAGNOSIS — I447 Left bundle-branch block, unspecified: Secondary | ICD-10-CM

## 2019-04-02 DIAGNOSIS — E785 Hyperlipidemia, unspecified: Secondary | ICD-10-CM

## 2019-04-02 DIAGNOSIS — Z7901 Long term (current) use of anticoagulants: Secondary | ICD-10-CM

## 2019-04-02 DIAGNOSIS — I2699 Other pulmonary embolism without acute cor pulmonale: Secondary | ICD-10-CM

## 2019-04-02 DIAGNOSIS — S069X9S Unspecified intracranial injury with loss of consciousness of unspecified duration, sequela: Secondary | ICD-10-CM | POA: Diagnosis not present

## 2019-04-02 DIAGNOSIS — I5042 Chronic combined systolic (congestive) and diastolic (congestive) heart failure: Secondary | ICD-10-CM | POA: Diagnosis not present

## 2019-04-02 LAB — POCT INR: INR: 1.6 — AB (ref 2.0–3.0)

## 2019-04-02 MED ORDER — PREDNISONE 10 MG PO TABS: ORAL_TABLET | ORAL | 0 refills | Status: AC

## 2019-04-02 NOTE — Patient Instructions (Signed)
-   Take 3 tablets warfarin today, then resume warfarin 2 tablets every day.  - Recheck INR on Wednesday.

## 2019-04-02 NOTE — Progress Notes (Signed)
Office Visit    Patient Name: [name] [name] Date of Encounter: 04/02/2019  Primary Care Provider:  Berniece Pap, FNP Primary Cardiologist:  [name], MD Electrophysiologist:  None   Chief Complaint    [name] [name] is a 59 y.o. male with a hx of HFrEF secondary to NICM with patient reporting clean cardiac cath, persistent atrial fibrillation on amiodarone, HTN, COPD, DVT/PE on Coumadin, hypothyroidism, chronic LE edema with cellulitis, OSA, DM2, obesity, gout  presents today for one week history of facial droop.   Past Medical History    Past Medical History:  Diagnosis Date  . Anxiety   . Arthritis   . Asthma   . Brain damage   . Chronic pain of both knees 07/13/2018  . Clotting disorder (HCC)   . COPD (chronic obstructive pulmonary disease) (HCC)   . Depression   . HFrEF (heart failure with reduced ejection fraction) (HCC)    a. 03/2018 Echo: EF 25-30%, diff HK. Mod LAE.  [name] History of DVT (deep vein thrombosis)   . History of pulmonary embolism    a. Chronic coumadin.  [name] Hypertension   . MI (myocardial infarction) (HCC)   . Morbid obesity (HCC)   . Neck pain 07/13/2018  . NICM (nonischemic cardiomyopathy) (HCC)    a. s/p Cath x 3 - reportedly nl cors. Last cath 2019 in [place]; b. a. 03/2018 Echo: EF 25-30%, diff HK.  [name] Persistent atrial fibrillation (HCC)    a. 03/2018 s/p DCCV; b. CHA2DS2VASc = 1-->Xarelto (later changed to warfarin); c. 05/2018 recurrent afib-->Amio initiated.  . Sleep apnea    Past Surgical History:  Procedure Laterality Date  . CARDIOVERSION N/A 03/24/2018   Procedure: CARDIOVERSION;  Surgeon: Antonieta Iba, MD;  Location: [facility] ORS;  Service: Cardiovascular;  Laterality: N/A;  . CARDIOVERSION N/A 08/08/2018   Procedure: CARDIOVERSION;  Surgeon: [name], MD;  Location: [facility] ORS;  Service: Cardiovascular;  Laterality: N/A;  . hearnia repair     X 3- total of two surgeries  . HERNIA REPAIR    . LEG SURGERY       Allergies  No Known Allergies  History of Present Illness    [name] [name] is a 59 y.o. male with a hx of HFrEF secondary to NICM with patient reporting clean cardiac cath, persistent atrial fibrillation on amiodarone, HTN, COPD, DVT/PE on Coumadin, hypothyroidism, chronic LE edema with cellulitis, OSA, DM2, obesity, gout last seen 03/15/19 by [name], PA.  NICM with EF 25-30% 03/2018 with reportedly normal coronary arteries by cath 2019. Clinic 01/2019 and was volume overloaded. Prior to this treated with IV abx while hospitalized for cellulitis.   ED visit 03/04/19 with chest pain with chronic dyspnea on exertion. Symptoms were atypical (intermittent and sharp shooting). Troponin mildly elevated and flat. Poor candidate for noninvasive testing due to body habitus. Due to hx of 3 cardiac cath with nonobstructive disease, no further ischemic evaluation was recommended.  Seen by his PCP with recommendation for eval by orthopedics regarding his chronic gout. He declined. He follows with home health for bilateral Unna boots - has declined to see would clinic.   Telephone call 03/28/19 reporting numbness and swelling in L side of face to L shoulder. Reported drooling. No noted slurred speech by RN who spoke with him. He was advised to go to the ED and declined. 03/28/19 INR 2.3.   Date INR  04/02/19 1.6  03/28/19 2.3  03/21/19 1.3  03/13/19 1.4  03/07/19 1.7  Woke up with symptoms 02/22/19. Noted L eye swollen and L cheek numb with L side of mouth unable to smile. No trouble swallowing. Mostly trouble drinking and spilling liquid. Can't open mouth as wide. Tells me it has not progressed over the last week and reamins about the same. Seen by home health nurse on Monday and Wednesday of last week who told him she thinks it is Bells Palsy.When asked why he did not seek emergency assistance or evaluation he tells me thought "y'all can do just as much here".   Little bit of chest pain Monday that  lasted a few days that occurred at rest and felt like "tingling". Tells me this has not recurred. Low suspicion angina as occurred at rest.   Reports he gets short of breath with any amount of activity. No shortness of breath at rest. Reports this is unchanged.  Reports he spends most of his day watching television and playing video games. Overall very sedentary. Does not follow heart healthy diet.  EKGs/Labs/Other Studies Reviewed:   The following studies were reviewed today:  Echo 03/2018   1. The left ventricle has severely reduced systolic function of 25-30%.  The cavity size was mildly increased. There is no increased left  ventricular wall thickness. Left ventricular diastology could not be  evaluated due to nondiagnostic images. Left  ventricular diffuse hypokinesis.   2. Left atrial size was moderately dilated.   3. Not assessed.   4. The mitral valve was not assessed. There is mild thickening and mild  calcification. Mitral valve regurgitation was not significant.   5. The tricuspid valve is not assessed. Tricuspid valve regurgitation was  not assessed by color flow Doppler.   6. The aortic valve was not assessed.   7. The interatrial septum was not well visualized.   EKG:  EKG is ordered today.  The ekg ordered today demonstrates rate controlled atrial fibrillation with LBBB.  Recent Labs: 01/25/2019: Magnesium 1.8 03/04/2019: ALT 13; B Natriuretic Peptide 189.0; TSH 21.002 03/05/2019: BUN 10; Creatinine, Ser 0.89; Hemoglobin 10.3; Platelets 235; Potassium 3.5; Sodium 138  Recent Lipid Panel    Component Value Date/Time   CHOL 153 01/22/2019 0346   CHOL 159 01/05/2019 1645   TRIG 125 01/22/2019 0346   HDL 48 01/22/2019 0346   HDL 66 01/05/2019 1645   CHOLHDL 3.2 01/22/2019 0346   VLDL 25 01/22/2019 0346   LDLCALC 80 01/22/2019 0346   LDLCALC 73 01/05/2019 1645    Home Medications   Current Meds  Medication Sig  . albuterol (VENTOLIN HFA) 108 (90 Base) MCG/ACT  inhaler Inhale 2 puffs into the lungs every 6 (six) hours as needed for wheezing or shortness of breath.  . allopurinol (ZYLOPRIM) 100 MG tablet Take 1 tablet (100 mg total) by mouth daily.  [name] atorvastatin (LIPITOR) 40 MG tablet TAKE 1 TABLET BY MOUTH ONCE DAILY  . DULoxetine (CYMBALTA) 20 MG capsule Take 20 mg by mouth daily.  . ferrous sulfate 325 (65 FE) MG tablet Take 1 tablet (325 mg total) by mouth 2 (two) times daily with a meal.  . levothyroxine (SYNTHROID) 50 MCG tablet Take 1 tablet (50 mcg total) by mouth daily before breakfast. Need lab repeat in 3 weeks from 03/08/19  . lisinopril (ZESTRIL) 2.5 MG tablet Take 1 tablet (2.5 mg total) by mouth daily.  . metoprolol succinate (TOPROL-XL) 50 MG 24 hr tablet Take 1 tablet (50 mg total) by mouth 2 (two) times daily. Take with or immediately following  a meal.  . nitroGLYCERIN (NITROSTAT) 0.4 MG SL tablet Place 1 tablet under tongue every 5 minutes as needed for chest pain. (No more than 3 doses within 15 minutes)  . torsemide (DEMADEX) 20 MG tablet Take 3 tablets (60 mg total) by mouth 2 (two) times daily.  . traZODone (DESYREL) 150 MG tablet TAKE 1 TABLET BY MOUTH AT BEDTIME AS NEEDED SLEEP  . warfarin (COUMADIN) 5 MG tablet Take by mouth daily as directed by the coumadin clinic (Patient taking differently: Take 5-10 mg by mouth daily at 6 PM. Take by mouth daily as directed by the coumadin clinic)    Review of Systems   Review of Systems  Constitution: Negative for chills, fever and malaise/fatigue.  Eyes: Negative for blurred vision, discharge, vision loss in left eye, vision loss in right eye and visual disturbance.       L eye "droopy"  Cardiovascular: Positive for dyspnea on exertion and leg swelling. Negative for chest pain, irregular heartbeat, near-syncope, orthopnea, palpitations and syncope.  Respiratory: Positive for shortness of breath. Negative for cough and wheezing.   Gastrointestinal: Negative for melena, nausea and  vomiting.  Genitourinary: Negative for hematuria.  Neurological: Positive for numbness (to L chin). Negative for dizziness, focal weakness, light-headedness, loss of balance and weakness.   All other systems reviewed and are otherwise negative except as noted above.  Physical Exam    VS:  BP 122/68 (BP Location: Left Arm, Patient Position: Sitting, Cuff Size: Normal)   Pulse 66   Ht 5\' 8"  (1.727 m)   Wt (!) 420 lb (190.5 kg)   SpO2 98%   BMI 63.86 kg/m  , BMI Body mass index is 63.86 kg/m. GEN: Well nourished, obese, well developed, in no acute distress. HEENT: normal. Neck: Supple, no JVD, carotid bruits, or masses. Cardiac: irregularly irregular, no murmurs, rubs, or gallops. No clubbing, cyanosis, edema.  Radials 2+ equal bilaterally.  Respiratory:  Respirations regular and unlabored. Diminished bases bilaterally likely due to body habitus. GI: Soft, nontender, nondistended. MS: No deformity or atrophy. Skin: Warm and dry, no rash. Bilateral LE with Unna boot intact Neuro:  Strength and sensation are intact. Left sided facial palsy noted. Both L and R eye do close completely. Grip strength equal bilateral UE. No vision field deficit. Psych: Normal affect.  Accessory Clinical Findings    ECG personally reviewed by me today - atrial fibrillation 66 bpm with LBBB - no acute changes.  Assessment & Plan    1. Bell's Palsy/Facial Droop - Onset left-sided facial droop 03/25/2019, 9 days ago.  Home health visit 03/26/2019 and 03/28/2019.  He called the office 03/28/2019 to report facial droop.  He was advised to report to the ED but did not as he tells me they could not do "more than than y'all do here".  We discussed the benefits of a CT scan and evaluation in the ED, he declined.  His neurological exam is overall unrevealing with the exception of left-sided facial palsy. To speech changes, weakness, aphasia. Discussed with Dr. Rockey Situ, DOD, who is in agreement as the symptoms are very  consistent with Bell's palsy and recommended discussing with PCP. Contacted his PCPs office and his PCP is out of the office this afternoon.  As such per present guidelines will initiate prednisone 60 mg x 5 days and then a 5-day taper.  He may benefit from an additional antiviral such as acyclovir but will defer to primary care.  He was instructed to call primary  care when he leaves this office.  Again, reiterated the recommendation for evaluation in the ED and he declined.  He was educated regarding the symptoms of stroke and instructed to call 911 if these recur.  2. Chronic systolic and diastolic heart failure - Weight stable today at 420lbs. Continue Torsemide 60mg  BID. Medication adherence difficult due to his continued confusion regarding his medications. Present GDMT include ACE, beta blocker, torsemide. Was previously on Spironolactone, he stopped for unclear reason and unable to resume due to difficulty monitoring. Will request recent labs and notes from [facility].   3. Persistent atrial fibrillation - EKG today shows rate controlled atrial fibrillation. Change from prior EKG 03/05/19. He is unaware of his atrial fibrillation with no palpitations nor shortness of breath.  Continue Toprol 50 mg daily for rate control.  Continue anticoagulation with warfarin, as below. He was previously on Amiodarone, but at televisit 02/2019 noted to not be taking and was not resumed due to medication confusion/compliance difficulties. Consider re-addition of Amiodarone at follow up.  4. Chronic anticoagulation - Secondary to atrial fibrillation.  CHA2DS2-VASc of at least 3 (HTN, thromboembolism x2).  Denies bleeding complications.  Reports compliance with his warfarin.  INR checked today shows that it is again subtherapeutic.  He was previously on Xarelto but had recurrent thrombus/embolus while on Xarelto recommendation was transitioned to transition to indefinite Coumadin.  Additionally, data on DOAC's  morbid obese patients is lacking and as such we will continue INR through Coumadin clinic.  5. Hx SVT/PE - Previously on Xarelto, noted recurrent thrombus/embolus. Transitioned to Coumadin, as above.   6. LBBB - Asymptomatic. Stable finding on EKG. Continue to monitor with EKG.  7. HTN - BP well controlled today.   8. HLD - Continue Atorvastatin. 01/2019 LDL 80 with reportedly normal coronary arteries on cath approx 1 year prior.  9. Nomocytic anemia - 03/05/19 Hb 10.3 overall stable. Reports he has some bleeding with wiping due to hemorrhoids but this happens "all the time". Continue Coumadin, as above.   10. Morbid obesity - Patient has been referred to bariatric surgery.  Significant portion of weight gain due to sedentary lifestyle due to hip pain per patient report.  Disposition: ED eval recommended, but patient declined. Long discussion regarding symptoms of stroke and educated that we cannot rule out stroke. Start Prednisone for Bell's Palsy. Educated to call PCP for appointment and follow up. Follow up in 1 month(s) with Dr. 03/07/19 or APP   Okey Dupre, NP 04/02/2019, 5:04 PM

## 2019-04-02 NOTE — Telephone Encounter (Signed)
Called and spoke with patient and urged him to go to the nearest ED today, patient verbalized importance and states that he will go. KW

## 2019-04-02 NOTE — Telephone Encounter (Signed)
PA - Gillian Shields is seeing pt due to - left facial droop - neurologial exam - not showing stoke - Luther Parody would like to speak with you regarding his care. Please call her back at [phone]

## 2019-04-02 NOTE — Patient Instructions (Addendum)
Medication Instructions:  Your physician has recommended you make the following change in your medication:   START Prednisone as directed.  Take 6 tablets (60 mg total) by mouth daily with breakfast for 5 days,  THEN 5 tablets (50 mg total) daily with breakfast for 1 day,  THEN 4 tablets (40 mg total) daily with breakfast for 1 day,  THEN 3 tablets (30 mg total) daily with breakfast for 1 day,  THEN 2 tablets (20 mg total) daily with breakfast for 1 day,  THEN 1 tablet (10 mg total) daily with breakfast for 1 day  *If you need a refill on your cardiac medications before your next appointment, please call your pharmacy*  Lab Work: You had an INR check today  If you have labs (blood work) drawn today and your tests are completely normal, you will receive your results only by: [name] MyChart Message (if you have MyChart) OR . A paper copy in the mail If you have any lab test that is abnormal or we need to change your treatment, we will call you to review the results.  Testing/Procedures: You had an EKG today. It showed rate controlled atrial fibrillation.  Follow-Up: At [facility], you and your health needs are our priority.  As part of our continuing mission to provide you with exceptional heart care, we have created designated Provider Care Teams.  These Care Teams include your primary Cardiologist (physician) and Advanced Practice Providers (APPs -  Physician Assistants and Nurse Practitioners) who all work together to provide you with the care you need, when you need it.  Your next appointment:   1 month(s)  The format for your next appointment:   In Person  Provider:    You may see [name], MD or one of the following Advanced Practice Providers on your designated Care Team:    Nicolasa Ducking, NP  [name], PA-C  Marisue Ivan, PA-C  Other Instructions   Your symptoms are consistent with Bell's Palsy (information included below). However, we cannot rule  out a stroke. You are at risk for a stroke due to your atrial fibrillation. If you have new or worsening numbness, recommend calling 911 and proceeding to the Emergency Room.    CALL AND SCHEDULE APPOINTMENT with primary care. We have made their office aware of your visit today.  Bell Palsy, Adult  Bell palsy is a short-term inability to move muscles in part of the face. The inability to move (paralysis) results from inflammation or compression of the facial nerve, which travels along the skull and under the ear to the side of the face (7th cranial nerve). This nerve is responsible for facial movements that include blinking, closing the eyes, smiling, and frowning. What are the causes? The exact cause of this condition is not known. It may be caused by an infection from a virus, such as the chickenpox (herpes zoster), Epstein-Barr, or mumps virus. What increases the risk? You are more likely to develop this condition if:  You are pregnant.  You have diabetes.  You have had a recent infection in your nose, throat, or airways (upper respiratory infection).  You have a weakened body defense system (immune system).  You have had a facial injury, such as a fracture.  You have a family history of Bell palsy. What are the signs or symptoms? Symptoms of this condition include:  Weakness on one side of the face.  Drooping eyelid and corner of the mouth.  Excessive tearing in one  eye.  Difficulty closing the eyelid.  Dry eye.  Drooling.  Dry mouth.  Changes in taste.  Change in facial appearance.  Pain behind one ear.  Ringing in one or both ears.  Sensitivity to sound in one ear.  Facial twitching.  Headache.  Impaired speech.  Dizziness.  Difficulty eating or drinking. Most of the time, only one side of the face is affected. Rarely, Bell palsy affects the whole face. How is this diagnosed? This condition is diagnosed based on:  Your symptoms.  Your medical  history.  A physical exam. You may also have to see health care providers who specialize in disorders of the nerves (neurologist) or diseases and conditions of the eye (ophthalmologist). You may have tests, such as:  A test to check for nerve damage (electromyogram).  Imaging studies, such as CT or MRI scans.  Blood tests. How is this treated? This condition affects every person differently. Sometimes symptoms go away without treatment within a couple weeks. If treatment is needed, it varies from person to person. The goal of treatment is to reduce inflammation and protect the eye from damage. Treatment for Bell palsy may include:  Medicines, such as: ? Steroids to reduce swelling and inflammation. ? Antiviral drugs. ? Pain relievers, including aspirin, acetaminophen, or ibuprofen.  Eye drops or ointment to keep your eye moist.  Eye protection, if you cannot close your eye.  Exercises or massage to regain muscle strength and function (physical therapy). Follow these instructions at home:   Take over-the-counter and prescription medicines only as told by your health care provider.  If your eye is affected: ? Keep your eye moist with eye drops or ointment as told by your health care provider. ? Follow instructions for eye care and protection as told by your health care provider.  Do any physical therapy exercises as told by your health care provider.  Keep all follow-up visits as told by your health care provider. This is important. Contact a health care provider if:  You have a fever.  Your symptoms do not get better within 2-3 weeks, or your symptoms get worse.  Your eye is red, irritated, or painful.  You have new symptoms. Get help right away if:  You have weakness or numbness in a part of your body other than your face.  You have trouble swallowing.  You develop neck pain or stiffness.  You develop dizziness or shortness of breath. Summary  Bell palsy is a  short-term inability to move muscles in part of the face. The inability to move (paralysis) results from inflammation or compression of the facial nerve.  This condition affects every person differently. Sometimes symptoms go away without treatment within a couple weeks.  If treatment is needed, it varies from person to person. The goal of treatment is to reduce inflammation and protect the eye from damage.  Contact your health care provider if your symptoms do not get better within 2-3 weeks, or your symptoms get worse. This information is not intended to replace advice given to you by your health care provider. Make sure you discuss any questions you have with your health care provider. Document Revised: 01/14/2017 Document Reviewed: 04/06/2016 Elsevier Patient Education  2020 [employer].   Warning Signs of a Stroke  A stroke is a medical emergency and should be treated right away-every second counts. A stroke is caused by a decrease or block in blood flow to the brain. When this occurs, certain areas of the brain  do not get enough oxygen, and brain cells begin to die. A stroke can lead to brain damage and can sometimes be life-threatening. However, if someone having a stroke gets medical treatment right away, he or she has better chances of surviving and recovering from the stroke. Being able to recognize the symptoms of a stroke is very important. Types of strokes There are two main types of strokes:  Ischemic strokes. This is the most common type of stroke. These strokes happen when a blood vessel that supplies blood to the brain is being blocked.  Hemorrhagic strokes. These strokes result from bleeding in the brain due to a blood vessel leaking or bursting (rupturing). A transient ischemic attack (TIA) is a "warning stroke" that causes stroke-like symptoms that go away quickly. Unlike a stroke, a TIA does not cause permanent damage to the brain. However, the symptoms of a TIA are the  same as a stroke, and they also require medical treatment right away. Having a TIA is a sign that you are at higher risk for a permanent stroke. Warning signs of a stroke The symptoms of stroke may vary and will reflect the part of the brain that is involved. Symptoms usually happen suddenly. "BE FAST" is an easy way to remember the main warning signs of a stroke. B - Balance Signs are dizziness, sudden trouble walking, or loss of balance. E - Eyes Signs are trouble seeing or a sudden change in vision. F - Face Signs are sudden weakness or numbness of the face, or the face or eyelid drooping on one side. A - Arms Signs are weakness or numbness in an arm. This happens suddenly and usually on one side of the body. S - Speech Signs are sudden trouble speaking, slurred speech, or trouble understanding what people say. T - Time Time to call emergency services. Write down what time symptoms started. Other signs of a stroke Some less common signs of a stroke include:  A sudden, severe headache with no known cause.  Nausea or vomiting.  Seizure. A stroke may be happening even if only one "BE FAST" symptoms is present. These symptoms may represent a serious problem that is an emergency. Do not wait to see if the symptoms will go away. Get medical help right away. Call your local emergency services (911 in the [place] Do not drive yourself to the hospital. Summary  A stroke is a medical emergency and should be treated right away-every second counts.  "BE FAST" is an easy way to remember the main warning signs of a stroke.  Call local emergency services right away if you or someone else has any stroke symptoms, even if the symptoms go away.  Make note of what time the first symptoms appeared. Emergency responders or emergency room staff will need to know this information.  Do not wait to see if symptoms will go away. Call 911 even if only one of the "BE FAST" symptoms appears. This  information is not intended to replace advice given to you by your health care provider. Make sure you discuss any questions you have with your health care provider. Document Revised: 01/14/2017 Document Reviewed: 05/21/2016 Elsevier Patient Education  [place].

## 2019-04-02 NOTE — Telephone Encounter (Signed)
Thank you :)

## 2019-04-02 NOTE — Telephone Encounter (Signed)
I contacted PA back at [facility] and spoke with Gillian Shields. Mrs. [name] [name] states that she saw patient in office for left facial droop and diagnosed patient with Bless Palsy, he was started on a prednisone taper today and advised to go to ED. Patient declined to go to ED and went home after visit. Mrs. Walker reports that patient had a normal neurological examen except the left sided palsy. Mrs. [name] [name] had concerns because patient reported that onset of symptom was 03/25/19, she states that it is a little late in starting treatment and wanted you to be aware of this so we follow back up with patient. KW

## 2019-04-02 NOTE — Telephone Encounter (Signed)
Sorry was out of the office half day. I also advise ED for patient he needs further work up/ imaging. He is high risk for stroke.

## 2019-04-03 DIAGNOSIS — S069X9S Unspecified intracranial injury with loss of consciousness of unspecified duration, sequela: Secondary | ICD-10-CM | POA: Diagnosis not present

## 2019-04-04 ENCOUNTER — Ambulatory Visit (INDEPENDENT_AMBULATORY_CARE_PROVIDER_SITE_OTHER): Payer: Medicaid Other

## 2019-04-04 DIAGNOSIS — Z4800 Encounter for change or removal of nonsurgical wound dressing: Secondary | ICD-10-CM | POA: Diagnosis not present

## 2019-04-04 DIAGNOSIS — Z86718 Personal history of other venous thrombosis and embolism: Secondary | ICD-10-CM

## 2019-04-04 DIAGNOSIS — I2699 Other pulmonary embolism without acute cor pulmonale: Secondary | ICD-10-CM

## 2019-04-04 DIAGNOSIS — S069X9S Unspecified intracranial injury with loss of consciousness of unspecified duration, sequela: Secondary | ICD-10-CM | POA: Diagnosis not present

## 2019-04-04 DIAGNOSIS — Z7901 Long term (current) use of anticoagulants: Secondary | ICD-10-CM | POA: Diagnosis not present

## 2019-04-04 DIAGNOSIS — I482 Chronic atrial fibrillation, unspecified: Secondary | ICD-10-CM

## 2019-04-04 LAB — POCT INR: INR: 1.9 — AB (ref 2.0–3.0)

## 2019-04-04 NOTE — Patient Instructions (Signed)
-   Take 3 tablets warfarin today, then resume warfarin 2 tablets every day.  - Recheck INR in 1 week.

## 2019-04-05 DIAGNOSIS — S069X9S Unspecified intracranial injury with loss of consciousness of unspecified duration, sequela: Secondary | ICD-10-CM | POA: Diagnosis not present

## 2019-04-06 DIAGNOSIS — S069X9S Unspecified intracranial injury with loss of consciousness of unspecified duration, sequela: Secondary | ICD-10-CM | POA: Diagnosis not present

## 2019-04-06 DIAGNOSIS — Z4800 Encounter for change or removal of nonsurgical wound dressing: Secondary | ICD-10-CM | POA: Diagnosis not present

## 2019-04-07 DIAGNOSIS — S069X9S Unspecified intracranial injury with loss of consciousness of unspecified duration, sequela: Secondary | ICD-10-CM | POA: Diagnosis not present

## 2019-04-08 DIAGNOSIS — S069X9S Unspecified intracranial injury with loss of consciousness of unspecified duration, sequela: Secondary | ICD-10-CM | POA: Diagnosis not present

## 2019-04-09 ENCOUNTER — Telehealth: Payer: Self-pay

## 2019-04-09 DIAGNOSIS — S069X9S Unspecified intracranial injury with loss of consciousness of unspecified duration, sequela: Secondary | ICD-10-CM | POA: Diagnosis not present

## 2019-04-09 DIAGNOSIS — D649 Anemia, unspecified: Secondary | ICD-10-CM

## 2019-04-09 DIAGNOSIS — Z4800 Encounter for change or removal of nonsurgical wound dressing: Secondary | ICD-10-CM | POA: Diagnosis not present

## 2019-04-09 NOTE — Telephone Encounter (Signed)
Called and spoke with patient who requested another referral to G .I. In informed paitent he was referred to [facility] in January and had refused appointment, I advised paitent that I will put in another one today. Patient also reports concerns of blurred vision , eye pain, swelling and eyes matted shut in AM. Patient states this is affecting his left eye and states that this began after episode of bells palsy. Patient is requesting referral to Opthalmology. KW

## 2019-04-09 NOTE — Telephone Encounter (Signed)
Copied from CRM [id number]. Topic: Referral - Status >> Apr 09, 2019  2:36 PM Fanny Bien wrote: Reason for CRM: pt called and stated that he would like a call back from the nurse egarding GI referral. Pt states that he did not refuse referral. Pt would also like to discuss a referral for right hip. Please advise

## 2019-04-10 ENCOUNTER — Telehealth: Payer: Self-pay

## 2019-04-10 ENCOUNTER — Ambulatory Visit: Payer: Self-pay

## 2019-04-10 ENCOUNTER — Encounter: Payer: Self-pay | Admitting: Pain Medicine

## 2019-04-10 DIAGNOSIS — S069X9S Unspecified intracranial injury with loss of consciousness of unspecified duration, sequela: Secondary | ICD-10-CM | POA: Diagnosis not present

## 2019-04-10 NOTE — Chronic Care Management (AMB) (Signed)
  Chronic Care Management   Outreach Note  04/10/2019 Name: [name] [name] MRN: [id number] DOB: 10-13-1960   Primary Care Provider: Berniece Pap, FNP Reason for referral : Chronic Care Management   Attempted follow up with [name] today. Since the last outreach attempt, he left a message acknowledging receipt of the care management voice messages.   He was not available during the outreach attempt today. A HIPAA compliant voice message was left requesting a return call.    Follow Up Plan: The care management team will attempt to reach [name] again next week.   Katha Cabal [facility] Care Management [phone]
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
## 2019-04-11 ENCOUNTER — Ambulatory Visit (INDEPENDENT_AMBULATORY_CARE_PROVIDER_SITE_OTHER): Payer: Medicaid Other

## 2019-04-11 ENCOUNTER — Telehealth (HOSPITAL_COMMUNITY): Payer: Self-pay

## 2019-04-11 ENCOUNTER — Other Ambulatory Visit: Payer: Self-pay

## 2019-04-11 ENCOUNTER — Ambulatory Visit: Payer: Medicaid Other | Attending: Pain Medicine | Admitting: Pain Medicine

## 2019-04-11 VITALS — Ht 68.0 in | Wt >= 6400 oz

## 2019-04-11 DIAGNOSIS — I482 Chronic atrial fibrillation, unspecified: Secondary | ICD-10-CM

## 2019-04-11 DIAGNOSIS — Z789 Other specified health status: Secondary | ICD-10-CM | POA: Insufficient documentation

## 2019-04-11 DIAGNOSIS — M5441 Lumbago with sciatica, right side: Secondary | ICD-10-CM

## 2019-04-11 DIAGNOSIS — S069X9S Unspecified intracranial injury with loss of consciousness of unspecified duration, sequela: Secondary | ICD-10-CM | POA: Diagnosis not present

## 2019-04-11 DIAGNOSIS — M1A9XX0 Chronic gout, unspecified, without tophus (tophi): Secondary | ICD-10-CM

## 2019-04-11 DIAGNOSIS — G894 Chronic pain syndrome: Secondary | ICD-10-CM

## 2019-04-11 DIAGNOSIS — Z7901 Long term (current) use of anticoagulants: Secondary | ICD-10-CM | POA: Diagnosis not present

## 2019-04-11 DIAGNOSIS — E8809 Other disorders of plasma-protein metabolism, not elsewhere classified: Secondary | ICD-10-CM | POA: Insufficient documentation

## 2019-04-11 DIAGNOSIS — G8929 Other chronic pain: Secondary | ICD-10-CM | POA: Insufficient documentation

## 2019-04-11 DIAGNOSIS — Z86718 Personal history of other venous thrombosis and embolism: Secondary | ICD-10-CM

## 2019-04-11 DIAGNOSIS — M1611 Unilateral primary osteoarthritis, right hip: Secondary | ICD-10-CM

## 2019-04-11 DIAGNOSIS — R7982 Elevated C-reactive protein (CRP): Secondary | ICD-10-CM | POA: Insufficient documentation

## 2019-04-11 DIAGNOSIS — I2699 Other pulmonary embolism without acute cor pulmonale: Secondary | ICD-10-CM

## 2019-04-11 DIAGNOSIS — R700 Elevated erythrocyte sedimentation rate: Secondary | ICD-10-CM | POA: Insufficient documentation

## 2019-04-11 DIAGNOSIS — R7989 Other specified abnormal findings of blood chemistry: Secondary | ICD-10-CM | POA: Insufficient documentation

## 2019-04-11 DIAGNOSIS — R7309 Other abnormal glucose: Secondary | ICD-10-CM | POA: Insufficient documentation

## 2019-04-11 DIAGNOSIS — M899 Disorder of bone, unspecified: Secondary | ICD-10-CM | POA: Insufficient documentation

## 2019-04-11 DIAGNOSIS — M25551 Pain in right hip: Secondary | ICD-10-CM

## 2019-04-11 DIAGNOSIS — Z79899 Other long term (current) drug therapy: Secondary | ICD-10-CM | POA: Insufficient documentation

## 2019-04-11 DIAGNOSIS — M5442 Lumbago with sciatica, left side: Secondary | ICD-10-CM

## 2019-04-11 DIAGNOSIS — Z4800 Encounter for change or removal of nonsurgical wound dressing: Secondary | ICD-10-CM | POA: Diagnosis not present

## 2019-04-11 LAB — POCT INR: INR: 5.9 — AB (ref 2.0–3.0)

## 2019-04-11 NOTE — Patient Instructions (Signed)
-   spoke w/ Misty Stanley, [facility] RN who is in pt's home - since pt has already taken warfarin today, have pt have a serving of greens today, skip warfarin tomorrow & Friday, then resume warfarin 2 tablets every day.  - recheck INR in 1 week - she will call back & let me know if [facility] services will be extended or if pt will come into the office.

## 2019-04-11 NOTE — Telephone Encounter (Signed)
Attempted to contact for visit to discuss Community paramedic program, no answer left voice mail with information.  Will continue to try to contact.   [name] [place] EMT-Paramedic [phone]

## 2019-04-11 NOTE — Telephone Encounter (Signed)
Ok to place GI referral again ;  He needs follow up on Bell's Palsy since he did not go to the ER as was advised by cardiology and our office to rule our stroke. For his eye this sounds like conjunctivitis possibly, he should be seen in office before being referred. Let me know if he has any questions.

## 2019-04-11 NOTE — Progress Notes (Signed)
Unsuccessful attempt to contact patient for Virtual Visit (Pain Management Telehealth)   Patient provided contact information:  [phone] (home); [phone] (mobile); (Preferred) [phone] [email or web address]   Pre-screening:  Our staff was successful in contacting Mr. [name] [name] using the above provided information.   I unsuccessfully attempted to make contact with [name] [name] on 04/11/2019 via telephone. I was unable to complete the virtual encounter due to call going directly to voicemail. I was able to leave a message where I clearly identify myself as Oswaldo Done, MD and I left a message to call us back to reschedule the call.   For the purpose of this evaluation, today I have reviewed the medical records on this patient including the evaluation and assessment done by Dr. Cherylann Ratel on 07/13/2018.  Since then, the patient has gone from a weight of 393 lbs to 408 lbs and a BMI of 62.04 kg/m.  I completely agree with the assessment of Dr. Cherylann Ratel.  The primary issue that this patient has to deal with is his weight.  Not only is that very closely related to all of his other medical problems, but it is also responsible for a lot of his chronic pain problems.  At this point, he will be represents a very high risk for any interventional therapies as well as any medication management secondary to his respiratory problems.  I believe that this patient would be better served by dealing with the weight problem first and once this has been addressed, then the other chronic pain problems may be dealt with in a much easier and less risky manner.   Pharmacotherapy Assessment  Analgesic: No opioid analgesics prescribed by our practice.   Follow-up plan:   Reschedule Visit.       Recent Visits No visits were found meeting these conditions.  Showing recent visits within past 90 days and meeting all other requirements   Today's Visits Date Type Provider Dept  04/11/19 Office Visit [name], MD [facility]  Showing today's visits and meeting all other requirements   Future Appointments No visits were found meeting these conditions.  Showing future appointments within next 90 days and meeting all other requirements    Note by: Oswaldo Done, MD Date: 04/11/2019; Time: 4:57 PM

## 2019-04-12 ENCOUNTER — Other Ambulatory Visit (HOSPITAL_COMMUNITY): Payer: Self-pay

## 2019-04-12 DIAGNOSIS — S069X9S Unspecified intracranial injury with loss of consciousness of unspecified duration, sequela: Secondary | ICD-10-CM | POA: Diagnosis not present

## 2019-04-12 NOTE — Telephone Encounter (Signed)
Referral for GI place 04/09/19, patient states that his number will change after 3/1/21a and asked to be contacted on [phone]. 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 KW

## 2019-04-13 ENCOUNTER — Encounter (HOSPITAL_COMMUNITY): Payer: Self-pay

## 2019-04-13 ENCOUNTER — Ambulatory Visit: Payer: Self-pay | Admitting: Licensed Clinical Social Worker

## 2019-04-13 DIAGNOSIS — S069X9S Unspecified intracranial injury with loss of consciousness of unspecified duration, sequela: Secondary | ICD-10-CM | POA: Diagnosis not present

## 2019-04-13 DIAGNOSIS — Z4800 Encounter for change or removal of nonsurgical wound dressing: Secondary | ICD-10-CM | POA: Diagnosis not present

## 2019-04-13 NOTE — Chronic Care Management (AMB) (Signed)
  Care Management   Follow Up Note   04/13/2019 Name: [name] [name] MRN: [id number] DOB: 12/04/1960  Referred by: Berniece Pap, FNP Reason for referral : Care Coordination   [name] [name] is a 59 y.o. year old male who is a primary care patient of Flinchum, [name], FNP. The care management team was consulted for assistance with care management and care coordination needs.    Review of patient status, including review of consultants reports, relevant laboratory and other test results, and collaboration with appropriate care team members and the patient's provider was performed as part of comprehensive patient evaluation and provision of chronic care management services.    LCSW received message from patient on 04/13/19. Patient reports interest in getting a wheelchair lift so that he can get to his medical appointments safely. Patient is wanting to know the new CCM nurse's contact information. LCSW will forward referral/request to CCM nurse.  [name], BSW, MSW, LCSW [facility] Care Management [place]  [facility] [email or web address] Phone: [phone]

## 2019-04-13 NOTE — Progress Notes (Signed)
Today was first home visit with [name] [name].  He was very inviting to have a visit with me.  He states doing ok, has a lot of pain.  Told him about the program and he wants to be part of it.  He has all his medications and verified them.  He is aware of how to take them and for what.  He weighs but not daily.  Advised him importance of weighing daily with heart failure.  He appeared to understand.  He has wraps on ankles and feet, he states they change them 3 times a week.  He has a lot of swelling and sores they are trying to treat.  Discussed diet and watching high sodium foods.  He likes to eat [place] and cheese sandwich.  Added the sodium up for him and in 1 sandwich it was 800 mg sodium.  He was surprised.  Discussed healthier choices for him.  He lives alone. He denies chest pain, headaches or dizziness.  He does have extremity swelling.  He states no worse than his normal.  Will continue to visit for heart failure.   [name] EMT-Paramedic [phone]

## 2019-04-14 DIAGNOSIS — S069X9S Unspecified intracranial injury with loss of consciousness of unspecified duration, sequela: Secondary | ICD-10-CM | POA: Diagnosis not present

## 2019-04-16 ENCOUNTER — Telehealth: Payer: Self-pay | Admitting: Internal Medicine

## 2019-04-16 DIAGNOSIS — G4733 Obstructive sleep apnea (adult) (pediatric): Secondary | ICD-10-CM | POA: Diagnosis not present

## 2019-04-16 DIAGNOSIS — S069X9S Unspecified intracranial injury with loss of consciousness of unspecified duration, sequela: Secondary | ICD-10-CM | POA: Diagnosis not present

## 2019-04-16 MED ORDER — WARFARIN SODIUM 5 MG PO TABS: ORAL_TABLET | ORAL | 0 refills | Status: DC

## 2019-04-16 NOTE — Telephone Encounter (Signed)
*  STAT* If patient is at the pharmacy, call can be transferred to refill team.   1. Which medications need to be refilled? (please list name of each medication and dose if known) warfarin 5 mg po as directed   2. Which pharmacy/location (including street and city if local pharmacy) is medication to be sent to? [facility]   3. Do they need a 30 day or 90 day supply? 90  Patient scheduled appt for next week and states HH can no longer come to do at home because medicaid will no longer pay .

## 2019-04-16 NOTE — Telephone Encounter (Signed)
Sent in #30 to [facility].

## 2019-04-17 ENCOUNTER — Ambulatory Visit: Payer: Self-pay

## 2019-04-17 ENCOUNTER — Telehealth: Payer: Self-pay

## 2019-04-17 DIAGNOSIS — G4733 Obstructive sleep apnea (adult) (pediatric): Secondary | ICD-10-CM | POA: Diagnosis not present

## 2019-04-17 DIAGNOSIS — S069X9S Unspecified intracranial injury with loss of consciousness of unspecified duration, sequela: Secondary | ICD-10-CM | POA: Diagnosis not present

## 2019-04-18 ENCOUNTER — Telehealth: Payer: Self-pay

## 2019-04-18 DIAGNOSIS — H539 Unspecified visual disturbance: Secondary | ICD-10-CM

## 2019-04-18 DIAGNOSIS — S069X9S Unspecified intracranial injury with loss of consciousness of unspecified duration, sequela: Secondary | ICD-10-CM | POA: Diagnosis not present

## 2019-04-18 MED ORDER — WARFARIN SODIUM 5 MG PO TABS: ORAL_TABLET | ORAL | 0 refills | Status: DC

## 2019-04-18 NOTE — Telephone Encounter (Signed)
30 day supply was sent in on Monday, 04/16/19.  Resent to [facility] w/ note explaining that I will not provide 90 day supply of warfarin, as pt needs to come in for INR checks.

## 2019-04-18 NOTE — Telephone Encounter (Signed)
Copied from CRM [id number]. Topic: General - Other >> Apr 18, 2019 12:06 PM Jaquita Rector A wrote: Reason for CRM: Patient called to inquire of Marcelino Duster if he can get a referral to se an eye Dr, GI specialis to help him loose weight before his hip surgery and also a referral to order a type of trailer to attach his [name] to carry his wheelchair. Per patient he is in need of a call back please to discuss these issues Ph# [phone]

## 2019-04-18 NOTE — Telephone Encounter (Signed)
*  STAT* If patient is at the pharmacy, call can be transferred to refill team.   1. Which medications need to be refilled? (please list name of each medication and dose if known) warfarin    2. Which pharmacy/location (including street and city if local pharmacy) is medication to be sent to? [facility]  3. Do they need a 30 day or 90 day supply? 90 day

## 2019-04-18 NOTE — Telephone Encounter (Signed)
Patient advised that GI referral went in on 04/10/19 he was given contact information for [facility] 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 In reference to to attach trailer on [name] I advised patient to contact his insurance company, if he had concerns of medical mobility or need for medical device I would need the appropriate paperwork and need to know which company I would be requesting from. Patient agreed that he would contact his insurance company. KW

## 2019-04-19 ENCOUNTER — Ambulatory Visit (INDEPENDENT_AMBULATORY_CARE_PROVIDER_SITE_OTHER): Payer: Medicaid Other | Admitting: Gastroenterology

## 2019-04-19 ENCOUNTER — Encounter: Payer: Self-pay | Admitting: Gastroenterology

## 2019-04-19 DIAGNOSIS — D649 Anemia, unspecified: Secondary | ICD-10-CM

## 2019-04-19 DIAGNOSIS — D509 Iron deficiency anemia, unspecified: Secondary | ICD-10-CM | POA: Diagnosis not present

## 2019-04-19 DIAGNOSIS — K5900 Constipation, unspecified: Secondary | ICD-10-CM

## 2019-04-19 DIAGNOSIS — S069X9S Unspecified intracranial injury with loss of consciousness of unspecified duration, sequela: Secondary | ICD-10-CM | POA: Diagnosis not present

## 2019-04-19 NOTE — Progress Notes (Addendum)
[name] [name] [address]  [address]  [place], [place] [address]  Main: [phone]  Fax: [phone]   Gastroenterology Consultation  Referring Provider:     Stephanie Acre* Primary Care Physician:  Berniece Pap, FNP Reason for Consultation:    Anemia        HPI:   Virtual Visit via Telephone Note  Video feed worked, but patient's mic did not. Changed to telephone visit  I connected with patient on 04/19/19 at  1:45 PM EST by telephone and verified that I am speaking with the correct person using two identifiers.   I discussed the limitations, risks, security and privacy concerns of performing an evaluation and management service by telephone and the availability of in person appointments. I also discussed with the patient that there may be a patient responsible charge related to this service. The patient expressed understanding and agreed to proceed.  Location of the patient: Home Location of provider: Home Participating persons: Patient and provider only   History of Present Illness: Chief Complaint  Patient presents with  . New Patient (Initial Visit)  . Anemia    Patient has blood in stools. Patient states this comes and goes. Patient is on blood thinner   . Constipation    Patient denies any constipation      [name] [name] is a 59 y.o. y/o male referred for consultation & management  by Dr. Ashok Norris, [name], FNP.  Patient referred for anemia.  Reports being on oral iron replacement.  Hemoglobin around 10 has been mildly low around 10-11 intermittently.  Last ferritin check in July 2020 was 19, and in May 2020 was 11.  Iron saturation 6 in May 2020 and 11 in January 2020.  Reports intermittent bright red blood per rectum, but about once every 3 months states he was told he has hemorrhoids and attributes this to his hemorrhoids.  Does report straining and constipation intermittently.  Reports having a colonoscopy about a year ago in  [place] and denies any polyps being found.  We do not have the report.  Denies ever having an EGD.  Has history of heart failure with low EF, A. fib on Coumadin, DVT and PE  Past Medical History:  Diagnosis Date  . Anxiety   . Arthritis   . Asthma   . Brain damage   . Chronic pain of both knees 07/13/2018  . Clotting disorder (HCC)   . COPD (chronic obstructive pulmonary disease) (HCC)   . Depression   . HFrEF (heart failure with reduced ejection fraction) (HCC)    a. 03/2018 Echo: EF 25-30%, diff HK. Mod LAE.  [name] History of DVT (deep vein thrombosis)   . History of pulmonary embolism    a. Chronic coumadin.  [name] Hypertension   . MI (myocardial infarction) (HCC)   . Morbid obesity (HCC)   . Neck pain 07/13/2018  . NICM (nonischemic cardiomyopathy) (HCC)    a. s/p Cath x 3 - reportedly nl cors. Last cath 2019 in [place]; b. a. 03/2018 Echo: EF 25-30%, diff HK.  [name] Persistent atrial fibrillation (HCC)    a. 03/2018 s/p DCCV; b. CHA2DS2VASc = 1-->Xarelto (later changed to warfarin); c. 05/2018 recurrent afib-->Amio initiated.  . Sleep apnea     Past Surgical History:  Procedure Laterality Date  . CARDIOVERSION N/A 03/24/2018   Procedure: CARDIOVERSION;  Surgeon: Antonieta Iba, MD;  Location: [facility] ORS;  Service: Cardiovascular;  Laterality: N/A;  . CARDIOVERSION N/A 08/08/2018  Procedure: CARDIOVERSION;  Surgeon: [name], MD;  Location: [facility] ORS;  Service: Cardiovascular;  Laterality: N/A;  . hearnia repair     X 3- total of two surgeries  . HERNIA REPAIR    . LEG SURGERY      Prior to Admission medications   Medication Sig Start Date End Date Taking? Authorizing Provider  albuterol (VENTOLIN HFA) 108 (90 Base) MCG/ACT inhaler Inhale 2 puffs into the lungs every 6 (six) hours as needed for wheezing or shortness of breath. 07/04/18  Yes Johnson, Megan P, DO  allopurinol (ZYLOPRIM) 100 MG tablet Take 1 tablet (100 mg total) by mouth daily. 03/13/19  Yes Flinchum, [name], FNP    atorvastatin (LIPITOR) 40 MG tablet TAKE 1 TABLET BY MOUTH ONCE DAILY 03/06/19  Yes Johnson, Megan P, DO  DULoxetine (CYMBALTA) 20 MG capsule Take 20 mg by mouth daily.   Yes [provider]  ferrous sulfate 325 (65 FE) MG tablet Take 1 tablet (325 mg total) by mouth 2 (two) times daily with a meal. 03/08/19  Yes Flinchum, [name], FNP  gabapentin (NEURONTIN) 300 MG capsule Take 300 mg by mouth 3 (three) times daily.   Yes [provider]  levothyroxine (SYNTHROID) 50 MCG tablet Take 50 mcg by mouth daily before breakfast.   Yes [provider]  lisinopril (ZESTRIL) 2.5 MG tablet Take 1 tablet (2.5 mg total) by mouth daily. 03/22/19  Yes Flinchum, [name], FNP  metoprolol succinate (TOPROL-XL) 50 MG 24 hr tablet Take 1 tablet (50 mg total) by mouth 2 (two) times daily. Take with or immediately following a meal. 03/08/19  Yes Flinchum, [name], FNP  nitroGLYCERIN (NITROSTAT) 0.4 MG SL tablet Place 1 tablet under tongue every 5 minutes as needed for chest pain. (No more than 3 doses within 15 minutes) 03/08/19  Yes Flinchum, [name], FNP  torsemide (DEMADEX) 20 MG tablet Take 3 tablets (60 mg total) by mouth 2 (two) times daily. 01/27/19  Yes Arnetha Courser, MD  traZODone (DESYREL) 150 MG tablet TAKE 1 TABLET BY MOUTH AT BEDTIME AS NEEDED SLEEP 03/22/19  Yes Flinchum, [name], FNP  warfarin (COUMADIN) 5 MG tablet Take by mouth daily as directed by the anti-coag clinic 04/18/19  Yes End, Cristal Deer, MD    Family History  Problem Relation Age of Onset  . Heart failure Mother   . Lung cancer Mother   . Lung cancer Father   . Heart attack Maternal Grandmother   . Heart attack Maternal Grandfather      Social History   Tobacco Use  . Smoking status: Never Smoker  . Smokeless tobacco: Never Used  Substance Use Topics  . Alcohol use: Not Currently  . Drug use: Never    Allergies as of 04/19/2019  . (No Known Allergies)    Review of Systems:    All systems  reviewed and negative except where noted in HPI.   Observations/Objective:  Labs: CBC    Component Value Date/Time   WBC 7.1 03/05/2019 0449   RBC 3.58 (L) 03/05/2019 0449   HGB 10.3 (L) 03/05/2019 0449   HGB 11.7 (L) 01/16/2019 0850   HCT 32.5 (L) 03/05/2019 0449   HCT 34.5 (L) 01/16/2019 0850   PLT 235 03/05/2019 0449   PLT 342 01/16/2019 0850   MCV 90.8 03/05/2019 0449   MCV 93 01/16/2019 0850   MCH 28.8 03/05/2019 0449   MCHC 31.7 03/05/2019 0449   RDW 15.4 03/05/2019 0449   RDW 15.4  01/16/2019 0850   LYMPHSABS 0.9 03/04/2019 1755   LYMPHSABS 1.1 01/16/2019 0850   MONOABS 0.6 03/04/2019 1755   EOSABS 0.1 03/04/2019 1755   EOSABS 0.3 01/05/2019 1645   BASOSABS 0.1 03/04/2019 1755   BASOSABS 0.0 01/05/2019 1645   CMP     Component Value Date/Time   NA 138 03/05/2019 0449   NA 134 01/05/2019 1645   K 3.5 03/05/2019 0449   CL 103 03/05/2019 0449   CO2 29 03/05/2019 0449   GLUCOSE 115 (H) 03/05/2019 0449   BUN 10 03/05/2019 0449   BUN 23 01/05/2019 1645   CREATININE 0.89 03/05/2019 0449   CALCIUM 7.9 (L) 03/05/2019 0449   PROT 6.4 (L) 03/04/2019 1848   PROT 7.3 01/05/2019 1645   ALBUMIN 3.0 (L) 03/04/2019 1848   ALBUMIN 3.9 01/05/2019 1645   AST 22 03/04/2019 1848   ALT 13 03/04/2019 1848   ALKPHOS 68 03/04/2019 1848   BILITOT 0.7 03/04/2019 1848   BILITOT 0.2 01/05/2019 1645   GFRNONAA >60 03/05/2019 0449   GFRAA >60 03/05/2019 0449    Imaging Studies: No results found.  Assessment and Plan:   Oliver Heitzenrater is a 59 y.o. y/o male has been referred for iron deficiency anemia with reportedly previous work-up including a colonoscopy about a year ago in [place] that showed hemorrhoids as per the patient  Assessment and Plan: With patient's chronic medical conditions, including heart failure with low EF, DVT, PE, A. fib, requiring Coumadin, patient is at high risk for procedures.  We will need to obtain previous records to determine if he needs repeat  procedures at this time or just an EGD at this time for example.  The importance of obtaining these records were discussed with the patient.  I also reached out to his PCP Laverna Peace and she states he has been noncompliant with follow-up and they have not been able to obtain his previous records either. I also reached out to his previous PCP Dr. Park Liter and they will try to look through their records to see if they have anything or let us know what facility the record release was sent to so we can call them. Pt states he will stop by our office to sign a record release  We would not want to schedule any procedures before reviewing previous records as they may not be needed if the last colonoscopy was already done for iron deficiency anemia  We will also refer to hematology to consider IV iron replacement  High-fiber diet MiraLAX  daily with goal of 1-2 soft bowel movements daily.  If not at goal, patient instructed to increase dose to twice daily.  If loose stools with the medication, patient asked to decrease the medication to every other day, or half dose daily.  Patient verbalized understanding   Follow Up Instructions: 4 to 6 weeks  I discussed the assessment and treatment plan with the patient. The patient was provided an opportunity to ask questions and all were answered. The patient agreed with the plan and demonstrated an understanding of the instructions.   The patient was advised to call back or seek an in-person evaluation if the symptoms worsen or if the condition fails to improve as anticipated.  I provided 12 minutes of non-face-to-face time during this encounter.   Virgel Manifold, MD  Speech recognition software was used to dictate the above note.

## 2019-04-20 DIAGNOSIS — S069X9S Unspecified intracranial injury with loss of consciousness of unspecified duration, sequela: Secondary | ICD-10-CM | POA: Diagnosis not present

## 2019-04-21 DIAGNOSIS — S069X9S Unspecified intracranial injury with loss of consciousness of unspecified duration, sequela: Secondary | ICD-10-CM | POA: Diagnosis not present

## 2019-04-23 DIAGNOSIS — S069X9S Unspecified intracranial injury with loss of consciousness of unspecified duration, sequela: Secondary | ICD-10-CM | POA: Diagnosis not present

## 2019-04-24 ENCOUNTER — Telehealth: Payer: Self-pay | Admitting: Gastroenterology

## 2019-04-24 DIAGNOSIS — S069X9S Unspecified intracranial injury with loss of consciousness of unspecified duration, sequela: Secondary | ICD-10-CM | POA: Diagnosis not present

## 2019-04-24 NOTE — Telephone Encounter (Signed)
I did not call patient. Patient needs a 4-6 weeks follow up appointment with Dr. Maximino Greenland

## 2019-04-24 NOTE — Telephone Encounter (Signed)
Patient called back & states he is returning Ashley's call.

## 2019-04-25 DIAGNOSIS — S069X9S Unspecified intracranial injury with loss of consciousness of unspecified duration, sequela: Secondary | ICD-10-CM | POA: Diagnosis not present

## 2019-04-26 ENCOUNTER — Telehealth: Payer: Self-pay

## 2019-04-26 DIAGNOSIS — S069X9S Unspecified intracranial injury with loss of consciousness of unspecified duration, sequela: Secondary | ICD-10-CM | POA: Diagnosis not present

## 2019-04-26 NOTE — Telephone Encounter (Signed)
Patient has refused follow up appointments for this and did not go to the ER for recommended work up and evaluation as advised by his cardiology office.  He could have had a stroke and not Bells palsy, as I have not been able to see him in the office since and he did not have appropriate imaging.   He has an in office visit with cardiology as well coming up.   I think needs to see neurology consult, we can place a referral however he may need an office visit prior. He would not come to the office for his eye as instructed per my advice, and wanted to go to opthalmology - can you find out if he went.   If patient is unwilling to come in to the office for these acute issues, we should possibly consider a referral to Doctor's making house calls for his care as with all these serious issues and his unwillingness to return to the office, this may be best for him long term.   If symptoms worsen at anytime or persist he should go to the emergency room.

## 2019-04-26 NOTE — Telephone Encounter (Signed)
Copied from CRM [id number]. Topic: Referral - Request for Referral >> Apr 26, 2019  9:09 AM Daphine Deutscher D wrote: Pt called asking for a referral for home health care person to assist him with cleaning, shopping, house hold duties. [employer] is th ename of the company and Archie Patten is the person to talk to [phone]

## 2019-04-26 NOTE — Telephone Encounter (Signed)
If he is having memory changes, he should be evaluated today in the ER - all we can do is advise this and tell him it is in his best interest to be evaluated immediately.  We can offer in person evaluation for his eye complaints as we have before and he declined.He should definitely get in with opthalmology asap. An urgent referral was placed at the time.

## 2019-04-26 NOTE — Chronic Care Management (AMB) (Signed)
  Chronic Care Management   Outreach Note   Name: [name] [name] MRN: [id number] DOB: 1960-12-02  Primary Care Provider: Berniece Pap, FNP Reason for referral : Chronic Care Management   An unsuccessful telephone outreach was attempted today. [name] was referred to the case management team for assistance with care management and care coordination.   I was unable to leave a voice message today. Received a automated prompt messaging was not available.    Follow Up Plan: The care management team will reach out to [name] again next week.    Katha Cabal [facility] Care Management [phone]

## 2019-04-26 NOTE — Telephone Encounter (Signed)
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 Patient states that [facility] said that services were no longer needed, patient states he contacted Medicaid to address need for home care and they stated to patient that they would no longer cover or extend care at [facility]. Patient reports that he still has blurred vision in left eye and eye pain, he reports that he still has facial droop on the left side. Patient is requesting that we put in order for home health care so he can have assistance in his home. KW

## 2019-04-26 NOTE — Telephone Encounter (Signed)
I tried speaking with patient on phone and advised him of below. Patient seemed to be confused on certain events. He states that the soonest opthamology could get him in wouldn't be until 4/30, patient states that he did not make appointment since it was so far out. Patient states that his home health nurse had diagnosed him with Bells Palsy after seeing him to check his INR. I went over cardiology note from 04/02/19 from Gillian Shields, patient  Did not seem to remember that visit with cardiology. When I spoke with patient in regards to being seen for in person evaluation and offered to put in referral for neurology consult patient seemed to not be interested. Patient request call back from you to discuss. KW

## 2019-04-26 NOTE — Telephone Encounter (Signed)
Please review request. KW 

## 2019-04-26 NOTE — Telephone Encounter (Signed)
When I asked Mr. Abdou if he was having memory problems or seeming to forget more easily he denied that it was any worse then what it has been and states that he had a lot going through his head due to the soon to be anniversary of his wifes death. I did instruct patient on the phone to go to ED he said he could not and brought back up transportation issue, patient will keep appt as scheduled on 05/02/19. KW

## 2019-04-30 DIAGNOSIS — S069X9S Unspecified intracranial injury with loss of consciousness of unspecified duration, sequela: Secondary | ICD-10-CM | POA: Diagnosis not present

## 2019-05-01 ENCOUNTER — Telehealth: Payer: Self-pay

## 2019-05-01 DIAGNOSIS — S069X9S Unspecified intracranial injury with loss of consciousness of unspecified duration, sequela: Secondary | ICD-10-CM | POA: Diagnosis not present

## 2019-05-01 NOTE — Telephone Encounter (Signed)
Copied from CRM [id number]. Topic: General - Other >> May 01, 2019  9:10 AM Gwenlyn Fudge wrote: Reason for CRM: Pt called and is requesting to add a vitamin b12 shot to his appt tomorrow. Please advise.

## 2019-05-01 NOTE — Telephone Encounter (Signed)
To address at appt. KW

## 2019-05-01 NOTE — Telephone Encounter (Signed)
Will address then, see no history of B12 deficiency unless he has labs at home.

## 2019-05-02 ENCOUNTER — Ambulatory Visit (INDEPENDENT_AMBULATORY_CARE_PROVIDER_SITE_OTHER): Payer: Medicaid Other | Admitting: Adult Health

## 2019-05-02 ENCOUNTER — Encounter: Payer: Self-pay | Admitting: Adult Health

## 2019-05-02 ENCOUNTER — Other Ambulatory Visit: Payer: Self-pay

## 2019-05-02 ENCOUNTER — Other Ambulatory Visit: Payer: Self-pay | Admitting: Adult Health

## 2019-05-02 VITALS — BP 126/78 | HR 78 | Temp 97.8°F | Resp 16

## 2019-05-02 DIAGNOSIS — H539 Unspecified visual disturbance: Secondary | ICD-10-CM | POA: Diagnosis not present

## 2019-05-02 DIAGNOSIS — D649 Anemia, unspecified: Secondary | ICD-10-CM

## 2019-05-02 DIAGNOSIS — I2699 Other pulmonary embolism without acute cor pulmonale: Secondary | ICD-10-CM

## 2019-05-02 DIAGNOSIS — Z91199 Patient's noncompliance with other medical treatment and regimen due to unspecified reason: Secondary | ICD-10-CM

## 2019-05-02 DIAGNOSIS — Z9119 Patient's noncompliance with other medical treatment and regimen: Secondary | ICD-10-CM

## 2019-05-02 DIAGNOSIS — R519 Headache, unspecified: Secondary | ICD-10-CM | POA: Diagnosis not present

## 2019-05-02 DIAGNOSIS — G8929 Other chronic pain: Secondary | ICD-10-CM

## 2019-05-02 DIAGNOSIS — M1A9XX0 Chronic gout, unspecified, without tophus (tophi): Secondary | ICD-10-CM | POA: Diagnosis not present

## 2019-05-02 DIAGNOSIS — I5022 Chronic systolic (congestive) heart failure: Secondary | ICD-10-CM | POA: Diagnosis not present

## 2019-05-02 DIAGNOSIS — M79642 Pain in left hand: Secondary | ICD-10-CM

## 2019-05-02 DIAGNOSIS — S069X9S Unspecified intracranial injury with loss of consciousness of unspecified duration, sequela: Secondary | ICD-10-CM | POA: Diagnosis not present

## 2019-05-02 DIAGNOSIS — Z9114 Patient's other noncompliance with medication regimen: Secondary | ICD-10-CM

## 2019-05-02 DIAGNOSIS — S0990XA Unspecified injury of head, initial encounter: Secondary | ICD-10-CM

## 2019-05-02 DIAGNOSIS — M25551 Pain in right hip: Secondary | ICD-10-CM | POA: Diagnosis not present

## 2019-05-02 DIAGNOSIS — I4819 Other persistent atrial fibrillation: Secondary | ICD-10-CM | POA: Diagnosis not present

## 2019-05-02 DIAGNOSIS — Z5329 Procedure and treatment not carried out because of patient's decision for other reasons: Secondary | ICD-10-CM

## 2019-05-02 DIAGNOSIS — Z125 Encounter for screening for malignant neoplasm of prostate: Secondary | ICD-10-CM | POA: Diagnosis not present

## 2019-05-02 DIAGNOSIS — E119 Type 2 diabetes mellitus without complications: Secondary | ICD-10-CM | POA: Diagnosis not present

## 2019-05-02 DIAGNOSIS — R4781 Slurred speech: Secondary | ICD-10-CM

## 2019-05-02 DIAGNOSIS — R29810 Facial weakness: Secondary | ICD-10-CM | POA: Diagnosis not present

## 2019-05-02 DIAGNOSIS — W19XXXA Unspecified fall, initial encounter: Secondary | ICD-10-CM

## 2019-05-02 DIAGNOSIS — I4891 Unspecified atrial fibrillation: Secondary | ICD-10-CM

## 2019-05-02 DIAGNOSIS — Z7901 Long term (current) use of anticoagulants: Secondary | ICD-10-CM

## 2019-05-02 DIAGNOSIS — R531 Weakness: Secondary | ICD-10-CM | POA: Diagnosis not present

## 2019-05-02 NOTE — Patient Instructions (Addendum)
Still need urgent emergent evaluation for stroke at emergency room since 04/02/19 however since you decline will order CT stat. If worsens or change mind Call 911 immediately.   Referral sent to [facility]. Please contact there office below to arrange appointment Phone [phone]

## 2019-05-02 NOTE — Progress Notes (Signed)
Patient: [name] [name] Male    DOB: September 29, 1960   59 y.o.   MRN: [id number] Visit Date: 05/02/2019  Today's Provider: Jairo Ben, FNP   Chief Complaint  Patient presents with  . Fall   Subjective:     Fall The accident occurred more than 1 week ago. The fall occurred while standing. He fell from a height of 1 to 2 ft. Impact surface: corner of fish tank. There was no blood loss. The point of impact was the face and left elbow. The pain is present in the face. Associated symptoms include headaches and a visual change. Pertinent negatives include no abdominal pain, bowel incontinence, fever, hearing loss, hematuria, loss of consciousness, nausea, numbness, tingling or vomiting. He has tried nothing for the symptoms.   He became dizzy with getting up and he was bent over before falling  and fell hit his head on the carpet and hit  left side of eye brow and bruised his elbow around one week ago. He denies any episodes of dizziness since the this episode.  He has had a headache since he saw cardiology 04/02/2019 and was told to go to the ER and did not go for evaluation of possible stroke he was subsequently was treated for Bell's Palsy no imaging.   He was called by this providers office multiple times, contacted some times and has been advised multiple times to be seen for stroke imaging in ER and offered office visits as well and he declined each time even after discussing risks.  Today he still has significant left sided facial droop, he has had some tremors since that time as well. He reports he thinks his left leg being weak is what caused him to fall.   He also had slurred speech at that time, he still notices this occasionally.. He has noticed increased weakness in left and right leg. He declines emergency room at all.   He has chronic hip pain and is not a candidate for surgery and he is not a candidate due to weight.   Medicaid quit paying for his home health. He  has appointment with wound clinic next week. He has una boots wraps off and they are improving. He has a appointment with cardiology next Wednesday.   He has not followed up with gastroenterology either of note and multiple contacts have been made to reach him by that office.   Patient  denies any fever,chills, rash, chest pain, shortness of breath, nausea, vomiting, or diarrhea.    No Known Allergies   Current Outpatient Medications:  .  albuterol (VENTOLIN HFA) 108 (90 Base) MCG/ACT inhaler, Inhale 2 puffs into the lungs every 6 (six) hours as needed for wheezing or shortness of breath., Disp: 1 Inhaler, Rfl: 3 .  allopurinol (ZYLOPRIM) 100 MG tablet, Take 1 tablet (100 mg total) by mouth daily., Disp: 30 tablet, Rfl: 0 .  atorvastatin (LIPITOR) 40 MG tablet, TAKE 1 TABLET BY MOUTH ONCE DAILY, Disp: 30 tablet, Rfl: 2 .  DULoxetine (CYMBALTA) 20 MG capsule, Take 20 mg by mouth daily., Disp: , Rfl:  .  ferrous sulfate 325 (65 FE) MG tablet, Take 1 tablet (325 mg total) by mouth 2 (two) times daily with a meal., Disp: 60 tablet, Rfl: 1 .  gabapentin (NEURONTIN) 300 MG capsule, Take 300 mg by mouth 3 (three) times daily., Disp: , Rfl:  .  levothyroxine (SYNTHROID) 50 MCG tablet, Take 50 mcg by mouth daily before breakfast.,  Disp: , Rfl:  .  lisinopril (ZESTRIL) 2.5 MG tablet, Take 1 tablet (2.5 mg total) by mouth daily., Disp: 30 tablet, Rfl: 0 .  metoprolol succinate (TOPROL-XL) 50 MG 24 hr tablet, Take 1 tablet (50 mg total) by mouth 2 (two) times daily. Take with or immediately following a meal., Disp: 30 tablet, Rfl: 1 .  nitroGLYCERIN (NITROSTAT) 0.4 MG SL tablet, Place 1 tablet under tongue every 5 minutes as needed for chest pain. (No more than 3 doses within 15 minutes), Disp: 15 tablet, Rfl: 3 .  torsemide (DEMADEX) 20 MG tablet, Take 3 tablets (60 mg total) by mouth 2 (two) times daily., Disp: 180 tablet, Rfl: 2 .  traZODone (DESYREL) 150 MG tablet, TAKE 1 TABLET BY MOUTH AT BEDTIME AS  NEEDED SLEEP, Disp: 30 tablet, Rfl: 0 .  warfarin (COUMADIN) 5 MG tablet, Take by mouth daily as directed by the anti-coag clinic, Disp: 30 tablet, Rfl: 0   Patient Active Problem List   Diagnosis Date Noted  . Slurred speech 05/03/2019  . Chronic intractable headache 05/03/2019  . Facial droop 05/02/2019  . Pharmacologic therapy 04/11/2019  . Disorder of skeletal system 04/11/2019  . Problems influencing health status 04/11/2019  . Chronic anticoagulation (warfarin  COUMADIN) 04/11/2019  . Hypocalcemia 04/11/2019  . Elevated sed rate 04/11/2019  . Elevated C-reactive protein (CRP) 04/11/2019  . Elevated hemoglobin A1c 04/11/2019  . Hypoalbuminemia 04/11/2019  . Edema due to hypoalbuminemia 04/11/2019  . Elevated brain natriuretic peptide (BNP) level 04/11/2019  . Chronic hip pain (Right) 04/11/2019  . Osteoarthritis of hip (Right) 04/11/2019  . Chronic atrial fibrillation (HCC) 03/08/2019  . Degenerative joint disease of right hip 03/05/2019  . Hypothyroidism 03/04/2019  . Chronic venous stasis dermatitis of both lower extremities 03/04/2019  . Long term (current) use of anticoagulants 02/23/2019  . Cellulitis 01/22/2019  . PE (pulmonary thromboembolism) (HCC) 01/21/2019  . Open leg wound 01/21/2019  . HLD (hyperlipidemia) 01/21/2019  . CAD (coronary artery disease) 01/21/2019  . Cellulitis of lower extremity 01/21/2019  . Noncompliance 01/09/2019  . Acquired thrombophilia (HCC) 11/28/2018  . Subtherapeutic international normalized ratio (INR) 11/11/2018  . Acute pulmonary embolism (HCC) 11/01/2018  . Depression, major, single episode, moderate (HCC) 09/17/2018  . Chest pain 08/06/2018  . Personal history of DVT (deep vein thrombosis) 07/13/2018  . History of pulmonary embolism (on Coumadin) 07/13/2018  . Chronic low back pain (Bilateral)  w/ sciatica (Bilateral) 07/13/2018  . TBI (traumatic brain injury) (HCC) 07/04/2018  . Anemia 06/27/2018  . Abnormal thyroid blood  test 06/27/2018  . Diet-controlled diabetes mellitus (HCC) 06/06/2018  . HTN (hypertension) 06/06/2018  . Chronic gout involving toe, unspecified cause, unspecified laterality 06/06/2018  . Class 3 obesity with alveolar hypoventilation, serious comorbidity, and body mass index (BMI) of 60.0 to 69.9 in adult (HCC) 06/06/2018  . Chronic pain syndrome 06/06/2018  . Ulcers of both lower extremities, limited to breakdown of skin (HCC) 06/06/2018  . Persistent atrial fibrillation (HCC)   . Chronic systolic heart failure (HCC) 02/02/2018  . COPD (chronic obstructive pulmonary disease) (HCC) 02/02/2018  . Obstructive sleep apnea 02/02/2018  . Lymphedema 02/02/2018  . Acute on chronic systolic CHF (congestive heart failure) (HCC) 02/02/2018  . Acute respiratory failure with hypoxia (HCC) 01/27/2018     Review of Systems  Constitutional: Negative for fever.  Eyes: Positive for photophobia, pain and discharge.       He has had this since 2/15 was sent to [facility] however did  not go.  Eye exam in office reveals visual deficit on Snellen chart.   Respiratory: Negative for apnea, cough, choking, chest tightness, shortness of breath, wheezing and stridor.   Cardiovascular: Positive for leg swelling (chronic ). Negative for chest pain and palpitations.  Gastrointestinal: Negative for abdominal distention, abdominal pain, anal bleeding, bowel incontinence, nausea and vomiting.  Genitourinary: Negative for hematuria.  Neurological: Positive for dizziness, facial asymmetry, speech difficulty (slurred at times ) and headaches. Negative for tingling, tremors, loss of consciousness, syncope, weakness and numbness.  Psychiatric/Behavioral: The patient is hyperactive.     Social History   Tobacco Use  . Smoking status: Never Smoker  . Smokeless tobacco: Never Used  Substance Use Topics  . Alcohol use: Not Currently    Current Outpatient Medications:  .  albuterol (VENTOLIN HFA) 108 (90  Base) MCG/ACT inhaler, Inhale 2 puffs into the lungs every 6 (six) hours as needed for wheezing or shortness of breath., Disp: 1 Inhaler, Rfl: 3 .  allopurinol (ZYLOPRIM) 100 MG tablet, Take 1 tablet (100 mg total) by mouth daily., Disp: 30 tablet, Rfl: 0 .  atorvastatin (LIPITOR) 40 MG tablet, TAKE 1 TABLET BY MOUTH ONCE DAILY, Disp: 30 tablet, Rfl: 2 .  DULoxetine (CYMBALTA) 20 MG capsule, Take 20 mg by mouth daily., Disp: , Rfl:  .  ferrous sulfate 325 (65 FE) MG tablet, Take 1 tablet (325 mg total) by mouth 2 (two) times daily with a meal., Disp: 60 tablet, Rfl: 1 .  gabapentin (NEURONTIN) 300 MG capsule, Take 300 mg by mouth 3 (three) times daily., Disp: , Rfl:  .  levothyroxine (SYNTHROID) 50 MCG tablet, Take 50 mcg by mouth daily before breakfast., Disp: , Rfl:  .  lisinopril (ZESTRIL) 2.5 MG tablet, Take 1 tablet (2.5 mg total) by mouth daily., Disp: 30 tablet, Rfl: 0 .  metoprolol succinate (TOPROL-XL) 50 MG 24 hr tablet, Take 1 tablet (50 mg total) by mouth 2 (two) times daily. Take with or immediately following a meal., Disp: 30 tablet, Rfl: 1 .  nitroGLYCERIN (NITROSTAT) 0.4 MG SL tablet, Place 1 tablet under tongue every 5 minutes as needed for chest pain. (No more than 3 doses within 15 minutes), Disp: 15 tablet, Rfl: 3 .  torsemide (DEMADEX) 20 MG tablet, Take 3 tablets (60 mg total) by mouth 2 (two) times daily., Disp: 180 tablet, Rfl: 2 .  traZODone (DESYREL) 150 MG tablet, TAKE 1 TABLET BY MOUTH AT BEDTIME AS NEEDED SLEEP, Disp: 30 tablet, Rfl: 0 .  warfarin (COUMADIN) 5 MG tablet, Take by mouth daily as directed by the anti-coag clinic, Disp: 30 tablet, Rfl: 0   Objective:    Blood pressure 126/78, pulse 78, temperature 97.8 F (36.6 C), temperature source Temporal, resp. rate 16, SpO2 95 %.  Physical Exam Vitals reviewed.  Constitutional:      General: He is awake. He is not in acute distress.    Appearance: He is morbidly obese. He is not ill-appearing, toxic-appearing or  diaphoretic.     Comments: disheveled appearance. Has patch over left eye.   HENT:     Head: Normocephalic and atraumatic. No raccoon eyes, Battle's sign, abrasion, contusion, masses, right periorbital erythema, left periorbital erythema or laceration. Hair is normal.     Jaw: There is normal jaw occlusion.     Salivary Glands: Right salivary gland is not diffusely enlarged or tender. Left salivary gland is not diffusely enlarged or tender.     Comments: No hematoma or bruising of  head or scalp     Right Ear: Hearing, tympanic membrane, ear canal and external ear normal.     Left Ear: Hearing, tympanic membrane, ear canal and external ear normal.     Nose: Nose normal.     Mouth/Throat:     Lips: Pink.     Mouth: Mucous membranes are moist.     Tongue: No lesions. Tongue does not deviate from midline.     Pharynx: Oropharynx is clear. Uvula midline. No posterior oropharyngeal erythema or uvula swelling.  Eyes:     General: Gaze aligned appropriately. No allergic shiner or scleral icterus.       Right eye: No discharge.        Left eye: Discharge present.    Extraocular Movements: Extraocular movements intact.     Conjunctiva/sclera: Conjunctivae normal.     Right eye: Right conjunctiva is not injected. No chemosis, exudate or hemorrhage.    Pupils: Pupils are equal, round, and reactive to light.     Comments: Left eye watery     Cardiovascular:     Rate and Rhythm: Normal rate and regular rhythm.     Pulses: Normal pulses.     Heart sounds: Normal heart sounds. No murmur. No friction rub. No gallop.      Comments: Una boots not present today, erythema to lower extremities. Non painful. Seeing wound clinic.  Pulmonary:     Effort: Pulmonary effort is normal. No respiratory distress.     Breath sounds: Normal breath sounds. No stridor. No wheezing, rhonchi or rales.  Chest:     Chest wall: No tenderness.  Abdominal:     General: There is no distension.     Palpations: Abdomen is  soft.     Hernia: No hernia is present.  Musculoskeletal:        General: Normal range of motion.     Right hand: Normal sensation. Normal capillary refill. Normal pulse.     Left hand: Decreased strength. Normal sensation. Normal capillary refill. Normal pulse.     Cervical back: Normal range of motion and neck supple.     Right lower leg: Edema present.     Left lower leg: Edema present.     Comments: Upper extremities  strength : 5/5 on right  Left strength 4/5  Strength :  Left lower extremity 4/5 Right lower extremity 4/5  Lymphadenopathy:     Cervical: No cervical adenopathy.  Skin:    General: Skin is warm and dry.     Capillary Refill: Capillary refill takes less than 2 seconds.     Coloration: Skin is not jaundiced or pale.     Findings: No bruising, erythema, lesion or rash.  Neurological:     Mental Status: He is alert and oriented to person, place, and time.     Cranial Nerves: Cranial nerve deficit and facial asymmetry present.     Sensory: Sensation is intact.     Motor: Weakness present.     Coordination: Finger-Nose-Finger Test abnormal (left hand ).     Deep Tendon Reflexes:     Reflex Scores:      Tricep reflexes are 2+ on the right side and 1+ on the left side.      Patellar reflexes are 2+ on the right side and 1+ on the left side.    Comments: Unable to ambulate in room he is in his electric wheelchair   Smile is unequal with left sided facial droop  Speech  has occasional slurring   Bilateral hands with edema.   Psychiatric:        Attention and Perception: Attention normal.        Mood and Affect: Mood normal.        Speech: Speech is slurred (only at times ).        Behavior: Behavior is hyperactive. Behavior is cooperative.        Thought Content: Thought content normal.        Cognition and Memory: Cognition normal.        Judgment: Judgment normal.      No results found for any visits on 05/02/19.     Assessment & Plan    Left-sided  weakness - Plan: CT Head Wo Contrast, Ambulatory referral to Neurology  Visual changes - Plan: Ambulatory referral to Neurology  Fall, initial encounter  Chronic systolic heart failure (HCC) - Plan: Ambulatory referral to Ophthalmology, INR/PT, Comprehensive Metabolic Panel (CMET)  Facial droop- left side  - Plan: CT Head Wo Contrast, Ambulatory referral to Neurology  Morbid obesity (HCC)  PE (pulmonary thromboembolism) (HCC)  Anemia, unspecified type - Plan: B12  Persistent atrial fibrillation (HCC)  Chronic intractable headache, unspecified headache type - Plan: CT Head Wo Contrast, Ambulatory referral to Neurology  Slurred speech- intermittent improving  - Plan: CT Head Wo Contrast, Ambulatory referral to Neurology  Hand pain, left - Plan: DG Hand Complete Left  Noncompliance by refusing service  Head injuries, initial encounter  Orders Placed This Encounter  Procedures  . CT Head Wo Contrast    Scheduling Instructions:     Would like done as soon as possible given his symptoms he has had for over a month- he declined ER  Multiple times.    Order Specific Question:   ** REASON FOR EXAM (FREE TEXT)    Answer:   patinet refused ED when seen with cardiology over a month ago. treated for bells palsy.  still has headache, eye changes left side and left sided weakness.    Order Specific Question:   Preferred imaging location?    Answer:   [facility]    Order Specific Question:   Call Results- Best Contact Number?    Answer:   [phone]    Order Specific Question:   Radiology Contrast Protocol - do NOT remove file path    Answer:   \\[email or web address]  . DG Hand Complete Left    Order Specific Question:   Reason for Exam (SYMPTOM  OR DIAGNOSIS REQUIRED)    Answer:   left hand pain, pointer finger pain, hurts with movement. No injury . he did fall recently.    Order Specific Question:   Preferred imaging location?    Answer:   [facility]    Order Specific Question:   Call Results- Best Contact Number?    Answer:   [phone]    Order Specific Question:   Radiology Contrast Protocol - do NOT remove file path    Answer:   \\[email or web address].pdf  . INR/PT  . Comprehensive Metabolic Panel (CMET)  . B12  . Ambulatory referral to Ophthalmology    Referral Priority:   Urgent    Referral Type:   Consultation    Referral Reason:   Specialty Services Required    Requested Specialty:   Ophthalmology    Number of Visits Requested:   1  . Ambulatory referral to Neurology    Referral Priority:   Urgent    Referral Type:  Consultation    Referral Reason:   Specialty Services Required    Referred to Provider:   Naoma Diener, NP    Requested Specialty:   Neurology    Number of Visits Requested:   1    Needs stat CT to rule out CVA, bleed from trauma/ fall as he is on coumadin for chronic atrial fibrillation and is high risk ,likely will need MRI following given symptoms  he again declined advised  emergency room evaluation. Will order CT for stroke like symptoms, patient agrees to go.  Will check labs today since he has not returned to the office and he can no longer have labs drawn at home. Home health left the home.  [facility] urgent evaluation.  Risks discussed of refusing treatment not limited to but including non reversible deficits due to CVA if confirmed.    There are other unrelated non-urgent complaints, but due to the busy schedule and the amount of time I've already spent with him, time does not permit me to address these routine issues at today's visit. I've requested another appointment to review these additional issues. 75 minutes spent on this patient in office and chart review.    The entirety of the information documented in the History of Present Illness, Review of Systems and Physical Exam were personally obtained by me. Portions of this information were  initially documented by the  Certified Medical Assistant whose name is documented in Epic and reviewed by me for thoroughness and accuracy.  I have personally performed the exam and reviewed the chart and it is accurate to the best of my knowledge.  Museum/gallery conservator has been used and any errors in dictation or transcription are unintentional.  [name]. Flinchum FNP-C  [facility]  Jairo Ben, FNP  [facility]

## 2019-05-03 ENCOUNTER — Telehealth: Payer: Self-pay

## 2019-05-03 DIAGNOSIS — R4781 Slurred speech: Secondary | ICD-10-CM | POA: Insufficient documentation

## 2019-05-03 DIAGNOSIS — S0990XA Unspecified injury of head, initial encounter: Secondary | ICD-10-CM | POA: Insufficient documentation

## 2019-05-03 DIAGNOSIS — G8929 Other chronic pain: Secondary | ICD-10-CM | POA: Insufficient documentation

## 2019-05-03 DIAGNOSIS — S069X9S Unspecified intracranial injury with loss of consciousness of unspecified duration, sequela: Secondary | ICD-10-CM | POA: Diagnosis not present

## 2019-05-03 DIAGNOSIS — Z91199 Patient's noncompliance with other medical treatment and regimen due to unspecified reason: Secondary | ICD-10-CM | POA: Insufficient documentation

## 2019-05-03 DIAGNOSIS — Z5329 Procedure and treatment not carried out because of patient's decision for other reasons: Secondary | ICD-10-CM | POA: Insufficient documentation

## 2019-05-03 DIAGNOSIS — M79642 Pain in left hand: Secondary | ICD-10-CM | POA: Insufficient documentation

## 2019-05-03 LAB — HEMOGLOBIN A1C
Est. average glucose Bld gHb Est-mCnc: 126 mg/dL
Hgb A1c MFr Bld: 6 % — ABNORMAL HIGH (ref 4.8–5.6)

## 2019-05-03 LAB — CBC WITH DIFFERENTIAL/PLATELET
Basophils Absolute: 0 10*3/uL (ref 0.0–0.2)
Basos: 1 %
EOS (ABSOLUTE): 0.2 10*3/uL (ref 0.0–0.4)
Eos: 4 %
Hematocrit: 40 % (ref 37.5–51.0)
Hemoglobin: 12.7 g/dL — ABNORMAL LOW (ref 13.0–17.7)
Immature Grans (Abs): 0 10*3/uL (ref 0.0–0.1)
Immature Granulocytes: 0 %
Lymphocytes Absolute: 1 10*3/uL (ref 0.7–3.1)
Lymphs: 18 %
MCH: 27.4 pg (ref 26.6–33.0)
MCHC: 31.8 g/dL (ref 31.5–35.7)
MCV: 86 fL (ref 79–97)
Monocytes Absolute: 0.9 10*3/uL (ref 0.1–0.9)
Monocytes: 16 %
Neutrophils Absolute: 3.4 10*3/uL (ref 1.4–7.0)
Neutrophils: 61 %
Platelets: 276 10*3/uL (ref 150–450)
RBC: 4.63 x10E6/uL (ref 4.14–5.80)
RDW: 17 % — ABNORMAL HIGH (ref 11.6–15.4)
WBC: 5.6 10*3/uL (ref 3.4–10.8)

## 2019-05-03 LAB — LIPID PANEL W/O CHOL/HDL RATIO
Cholesterol, Total: 166 mg/dL (ref 100–199)
HDL: 70 mg/dL (ref >39)
LDL Chol Calc (NIH): 75 mg/dL (ref 0–99)
Triglycerides: 122 mg/dL (ref 0–149)
VLDL Cholesterol Cal: 21 mg/dL (ref 5–40)

## 2019-05-03 LAB — COMPREHENSIVE METABOLIC PANEL
ALT: 11 IU/L (ref 0–44)
AST: 13 IU/L (ref 0–40)
Albumin/Globulin Ratio: 1.3 (ref 1.2–2.2)
Albumin: 3.8 g/dL (ref 3.8–4.9)
Alkaline Phosphatase: 103 IU/L (ref 39–117)
BUN/Creatinine Ratio: 15 (ref 9–20)
BUN: 17 mg/dL (ref 6–24)
Bilirubin Total: 0.3 mg/dL (ref 0.0–1.2)
CO2: 28 mmol/L (ref 20–29)
Calcium: 9.2 mg/dL (ref 8.7–10.2)
Chloride: 98 mmol/L (ref 96–106)
Creatinine, Ser: 1.13 mg/dL (ref 0.76–1.27)
GFR calc Af Amer: 82 mL/min/{1.73_m2} (ref >59)
GFR calc non Af Amer: 71 mL/min/{1.73_m2} (ref >59)
Globulin, Total: 3 g/dL (ref 1.5–4.5)
Glucose: 92 mg/dL (ref 65–99)
Potassium: 4.6 mmol/L (ref 3.5–5.2)
Sodium: 141 mmol/L (ref 134–144)
Total Protein: 6.8 g/dL (ref 6.0–8.5)

## 2019-05-03 LAB — PROTIME-INR
INR: 1.8 — ABNORMAL HIGH (ref 0.9–1.2)
Prothrombin Time: 18.8 s — ABNORMAL HIGH (ref 9.1–12.0)

## 2019-05-03 LAB — BRAIN NATRIURETIC PEPTIDE: BNP: 44.1 pg/mL (ref 0.0–100.0)

## 2019-05-03 LAB — TSH: TSH: 10.6 u[IU]/mL — ABNORMAL HIGH (ref 0.450–4.500)

## 2019-05-03 LAB — VITAMIN B12: Vitamin B-12: 1126 pg/mL (ref 232–1245)

## 2019-05-03 LAB — URIC ACID: Uric Acid: 9.3 mg/dL — ABNORMAL HIGH (ref 3.8–8.4)

## 2019-05-03 LAB — PSA: Prostate Specific Ag, Serum: 0.6 ng/mL (ref 0.0–4.0)

## 2019-05-03 NOTE — Addendum Note (Signed)
Addended by: Berniece Pap on: 05/03/2019 08:52 PM   Modules accepted: Orders

## 2019-05-03 NOTE — Telephone Encounter (Signed)
Copied from CRM [id number]. Topic: General - Other >> May 03, 2019 11:13 AM Maye Hides wrote: Reason for CRM: Insurance company would like to speak peer to peer to get approval for CT of head.Phone [phone]. Case # [id number]

## 2019-05-03 NOTE — Telephone Encounter (Addendum)
Notes were faxed a couple of hours ago. If not approved peer to peer set up for 3:15  320pm peer to peer review completed with Marvell Fuller NP-c, approval code for CT head to rule out bleed/ CVA given and is [id number], Peer to peer also agrees MR Brain should be covered as well. Provider will again place order.

## 2019-05-03 NOTE — Telephone Encounter (Signed)
Provider called and they are requesting note, which is now complete be faxed to [phone] with the below case number to their office for STAT CT order.

## 2019-05-04 ENCOUNTER — Encounter: Payer: Medicaid Other | Attending: Physician Assistant | Admitting: Physician Assistant

## 2019-05-04 ENCOUNTER — Other Ambulatory Visit: Payer: Self-pay

## 2019-05-04 DIAGNOSIS — I252 Old myocardial infarction: Secondary | ICD-10-CM | POA: Diagnosis not present

## 2019-05-04 DIAGNOSIS — I872 Venous insufficiency (chronic) (peripheral): Secondary | ICD-10-CM | POA: Insufficient documentation

## 2019-05-04 DIAGNOSIS — Z86711 Personal history of pulmonary embolism: Secondary | ICD-10-CM | POA: Diagnosis not present

## 2019-05-04 DIAGNOSIS — I5042 Chronic combined systolic (congestive) and diastolic (congestive) heart failure: Secondary | ICD-10-CM | POA: Insufficient documentation

## 2019-05-04 DIAGNOSIS — J9610 Chronic respiratory failure, unspecified whether with hypoxia or hypercapnia: Secondary | ICD-10-CM | POA: Insufficient documentation

## 2019-05-04 DIAGNOSIS — E669 Obesity, unspecified: Secondary | ICD-10-CM | POA: Diagnosis not present

## 2019-05-04 DIAGNOSIS — L97822 Non-pressure chronic ulcer of other part of left lower leg with fat layer exposed: Secondary | ICD-10-CM | POA: Diagnosis not present

## 2019-05-04 DIAGNOSIS — J449 Chronic obstructive pulmonary disease, unspecified: Secondary | ICD-10-CM | POA: Insufficient documentation

## 2019-05-04 DIAGNOSIS — I110 Hypertensive heart disease with heart failure: Secondary | ICD-10-CM | POA: Diagnosis not present

## 2019-05-04 DIAGNOSIS — I480 Paroxysmal atrial fibrillation: Secondary | ICD-10-CM | POA: Insufficient documentation

## 2019-05-04 DIAGNOSIS — L97812 Non-pressure chronic ulcer of other part of right lower leg with fat layer exposed: Secondary | ICD-10-CM | POA: Diagnosis not present

## 2019-05-04 DIAGNOSIS — Z7901 Long term (current) use of anticoagulants: Secondary | ICD-10-CM | POA: Diagnosis not present

## 2019-05-04 DIAGNOSIS — I890 Lymphedema, not elsewhere classified: Secondary | ICD-10-CM | POA: Insufficient documentation

## 2019-05-04 DIAGNOSIS — Z6841 Body Mass Index (BMI) 40.0 and over, adult: Secondary | ICD-10-CM | POA: Insufficient documentation

## 2019-05-04 DIAGNOSIS — E1151 Type 2 diabetes mellitus with diabetic peripheral angiopathy without gangrene: Secondary | ICD-10-CM | POA: Diagnosis not present

## 2019-05-04 DIAGNOSIS — E11621 Type 2 diabetes mellitus with foot ulcer: Secondary | ICD-10-CM | POA: Diagnosis not present

## 2019-05-04 DIAGNOSIS — E11622 Type 2 diabetes mellitus with other skin ulcer: Secondary | ICD-10-CM | POA: Diagnosis not present

## 2019-05-04 NOTE — Progress Notes (Signed)
ORAL, REMACHE ([id number]) Visit Report for 05/04/2019 Allergy List Details Patient Name: [name] [name], [name] [name] Date of Service: 05/04/2019 2:15 PM Medical Record Number: [id number] Patient Account Number: [id number] Date of Birth/Sex: 1960-08-27 (59 y.o. M) Treating RN: Curtis Sites Primary Care Christifer Chapdelaine: Marvell Fuller Other Clinician: Referring Tejah Brekke: Marvell Fuller Treating Zedrick Springsteen/Extender: STONE III, HOYT Weeks in Treatment: 0 Allergies Active Allergies No Known Drug Allergies Allergy Notes Electronic Signature(s) Signed: 05/04/2019 4:39:31 PM By: Curtis Sites Entered By: Curtis Sites on 05/04/2019 14:51:36 [name] [name] ([id number]) -------------------------------------------------------------------------------- Arrival Information Details Patient Name: [name] [name] Date of Service: 05/04/2019 2:15 PM Medical Record Number: [id number] Patient Account Number: [id number] Date of Birth/Sex: 11-19-1960 (59 y.o. M) Treating RN: Curtis Sites Primary Care Haivyn Oravec: Marvell Fuller Other Clinician: Referring Adonai Selsor: Marvell Fuller Treating Leiam Hopwood/Extender: Linwood Dibbles, HOYT Weeks in Treatment: 0 Visit Information Patient Arrived: Wheel Chair Arrival Time: 14:50 Accompanied By: self Transfer Assistance: None Patient Identification Verified: Yes Secondary Verification Process Completed: Yes Patient Has Alerts: Yes Patient Alerts: Patient on Blood Thinner Warfarin History Since Last Visit Added or deleted any medications: No Any new allergies or adverse reactions: No Had a fall or experienced change in activities of daily living that may affect risk of falls: No Signs or symptoms of abuse/neglect since last visito No Hospitalized since last visit: No Implantable device outside of the clinic excluding cellular tissue based products placed in the center since last visit: No Has Compression in Place as Prescribed: No Electronic Signature(s) Signed:  05/04/2019 4:39:31 PM By: Curtis Sites Entered By: Curtis Sites on 05/04/2019 14:50:40 [name] [name] ([id number]) -------------------------------------------------------------------------------- Clinic Level of Care Assessment Details Patient Name: [name] [name] Date of Service: 05/04/2019 2:15 PM Medical Record Number: [id number] Patient Account Number: [id number] Date of Birth/Sex: 1960-12-23 (60 y.o. M) Treating RN: Curtis Sites Primary Care Conni Knighton: Marvell Fuller Other Clinician: Referring Deniece Rankin: Marvell Fuller Treating Shaunak Kreis/Extender: STONE III, HOYT Weeks in Treatment: 0 Clinic Level of Care Assessment Items TOOL 1 Quantity Score []  - Use when EandM and Procedure is performed on INITIAL visit 0 ASSESSMENTS - Nursing Assessment / Reassessment X - General Physical Exam (combine w/ comprehensive assessment (listed just below) when performed on new pt. 1 20 evals) X- 1 25 Comprehensive Assessment (HX, ROS, Risk Assessments, Wounds Hx, etc.) ASSESSMENTS - Wound and Skin Assessment / Reassessment []  - Dermatologic / Skin Assessment (not related to wound area) 0 ASSESSMENTS - Ostomy and/or Continence Assessment and Care []  - Incontinence Assessment and Management 0 []  - 0 Ostomy Care Assessment and Management (repouching, etc.) PROCESS - Coordination of Care X - Simple Patient / Family Education for ongoing care 1 15 []  - 0 Complex (extensive) Patient / Family Education for ongoing care X- 1 10 Staff obtains , Records, Test Results / Process Orders []  - 0 Staff telephones HHA, Nursing Homes / Clarify orders / etc []  - 0 Routine Transfer to another Facility (non-emergent condition) []  - 0 Routine Hospital Admission (non-emergent condition) X- 1 15 New Admissions / / Ordering NPWT, Apligraf, etc. []  - 0 Emergency Hospital Admission (emergent condition) PROCESS - Special Needs []  - Pediatric / Minor Patient Management  0 []  - 0 Isolation Patient Management []  - 0 Hearing / Language / Visual special needs []  - 0 Assessment of Community assistance (transportation, D/C planning, etc.) []  - 0 Additional assistance / Altered mentation []  - 0 Support Surface(s) Assessment (bed, cushion, seat, etc.) INTERVENTIONS - Miscellaneous []  - External ear exam 0 []  - 0 Patient Transfer (multiple staff /  Hoyer Lift / Similar devices) []  - 0 Simple Staple / Suture removal (25 or less) []  - 0 Complex Staple / Suture removal (26 or more) []  - 0 Hypo/Hyperglycemic Management (do not check if billed separately) [name] [name], [name] [name] ([id number]) []  - 0 Ankle / Brachial Index (ABI) - do not check if billed separately Has the patient been seen at the hospital within the last three years: Yes Total Score: 85 Level Of Care: New/Established - Level 3 Electronic Signature(s) Signed: 05/04/2019 4:39:31 PM By: Curtis Sites Entered By: Curtis Sites on 05/04/2019 15:26:51 [name] [name] ([id number]) -------------------------------------------------------------------------------- Compression Therapy Details Patient Name: [name] [name] Date of Service: 05/04/2019 2:15 PM Medical Record Number: [id number] Patient Account Number: [id number] Date of Birth/Sex: Apr 28, 1960 (59 y.o. M) Treating RN: Curtis Sites Primary Care Dowell Hoon: Marvell Fuller Other Clinician: Referring Josede Cicero: Marvell Fuller Treating Azyiah Bo/Extender: STONE III, HOYT Weeks in Treatment: 0 Compression Therapy Performed for Wound Assessment: Wound #3 Right,Proximal,Lateral Lower Leg Performed By: Clinician Curtis Sites, RN Compression Type: Henriette Combs Post Procedure Diagnosis Same as Pre-procedure Electronic Signature(s) Signed: 05/04/2019 4:39:31 PM By: Curtis Sites Entered By: Curtis Sites on 05/04/2019 15:26:35 [name] [name] ([id number]) -------------------------------------------------------------------------------- Compression  Therapy Details Patient Name: [name] [name] Date of Service: 05/04/2019 2:15 PM Medical Record Number: [id number] Patient Account Number: [id number] Date of Birth/Sex: Dec 13, 1960 (59 y.o. M) Treating RN: Curtis Sites Primary Care Toran Murch: Marvell Fuller Other Clinician: Referring Tamilyn Lupien: Marvell Fuller Treating Elaysia Devargas/Extender: STONE III, HOYT Weeks in Treatment: 0 Compression Therapy Performed for Wound Assessment: Wound #4 Right,Distal,Lateral Lower Leg Performed By: Clinician Curtis Sites, RN Compression Type: Henriette Combs Post Procedure Diagnosis Same as Pre-procedure Electronic Signature(s) Signed: 05/04/2019 4:39:31 PM By: Curtis Sites Entered By: Curtis Sites on 05/04/2019 15:26:35 [name] [name] ([id number]) -------------------------------------------------------------------------------- Compression Therapy Details Patient Name: [name] [name] Date of Service: 05/04/2019 2:15 PM Medical Record Number: [id number] Patient Account Number: [id number] Date of Birth/Sex: [birth year] (59 y.o. M) Treating RN: Curtis Sites Primary Care Amea Mcphail: Marvell Fuller Other Clinician: Referring Shail Urbas: Marvell Fuller Treating Brodie Scovell/Extender: STONE III, HOYT Weeks in Treatment: 0 Compression Therapy Performed for Wound Assessment: Wound #5 Left,Lateral Lower Leg Performed By: Clinician Curtis Sites, RN Compression Type: Henriette Combs Post Procedure Diagnosis Same as Pre-procedure Electronic Signature(s) Signed: 05/04/2019 4:39:31 PM By: Curtis Sites Entered By: Curtis Sites on 05/04/2019 15:26:35 [name] [name] ([id number]) -------------------------------------------------------------------------------- Encounter Discharge Information Details Patient Name: [name] [name] Date of Service: 05/04/2019 2:15 PM Medical Record Number: [id number] Patient Account Number: [id number] Date of Birth/Sex: January 21, 1961 (59 y.o. M) Treating RN: Curtis Sites Primary Care Lateshia Schmoker: Marvell Fuller Other Clinician: Referring Sigismund Cross: Marvell Fuller Treating Yasheka Fossett/Extender: Linwood Dibbles, HOYT Weeks in Treatment: 0 Encounter Discharge Information Items Discharge Condition: Stable Ambulatory Status: Wheelchair Discharge Destination: Home Transportation: Private Auto Accompanied By: self Schedule Follow-up Appointment: Yes Clinical Summary of Care: Electronic Signature(s) Signed: 05/04/2019 4:39:31 PM By: Curtis Sites Entered By: Curtis Sites on 05/04/2019 15:27:59 [name] [name] ([id number]) -------------------------------------------------------------------------------- Lower Extremity Assessment Details Patient Name: [name] [name] Date of Service: 05/04/2019 2:15 PM Medical Record Number: [id number] Patient Account Number: [id number] Date of Birth/Sex: Jul 18, 1960 (59 y.o. M) Treating RN: Curtis Sites Primary Care Emmanuelle Hibbitts: Marvell Fuller Other Clinician: Referring Maleek Craver: Marvell Fuller Treating Jennefer Kopp/Extender: STONE III, HOYT Weeks in Treatment: 0 Edema Assessment Assessed: [Left: No] [Right: No] Edema: [Left: Yes] [Right: Yes] Calf Left: Right: Point of Measurement: 31 cm From Medial Instep 59 cm 59 cm Ankle Left: Right: Point of Measurement: 13 cm From Medial Instep 37.5 cm 39 cm Vascular Assessment Pulses: Dorsalis Pedis Palpable: [  Left:No] [Right:No] Doppler Audible: [Left:Yes] [Right:Yes] Posterior Tibial Palpable: [Left:No Yes] [Right:No Yes] Notes unable to obtain ABI r/t size of legs Electronic Signature(s) Signed: 05/04/2019 4:39:31 PM By: Curtis Sites Entered By: Curtis Sites on 05/04/2019 15:11:31 [name] [name] ([id number]) -------------------------------------------------------------------------------- Multi Wound Chart Details Patient Name: [name] [name] Date of Service: 05/04/2019 2:15 PM Medical Record Number: [id number] Patient Account Number: [id number] Date of  Birth/Sex: [birth year] (59 y.o. 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 Comorbid History: Asthma, Chronic Obstructive Asthma, Chronic Obstructive Asthma, Chronic Obstructive Pulmonary Disease (COPD), Sleep Pulmonary Disease (COPD), Sleep Pulmonary Disease (COPD), Sleep Apnea, Arrhythmia, Congestive Apnea, Arrhythmia, Congestive Apnea, Arrhythmia, Congestive Heart Failure, Hypertension, Heart Failure, Hypertension, Heart Failure, Hypertension, Myocardial Infarction, Type II Myocardial Infarction, Type II Myocardial Infarction, Type II Diabetes Diabetes Diabetes Date Acquired: 02/16/2019 02/12/2019 02/12/2019 Weeks of Treatment: 0 0 0 Wound Status: Open Open Open Clustered Wound: No No Yes Clustered Quantity: N/A N/A 2 Measurements L x W x D (cm) 0.8x0.6x0.1 2.3x5.5x0.1 3.5x7.2x0.1 Area (cm) : 0.377 9.935 19.792 Volume (cm) : 0.038 0.994 1.979 % Reduction in Area: N/A N/A 0.00% % Reduction in Volume: N/A N/A 0.00% Classification: Grade 1 Full Thickness Without Exposed Full Thickness Without Exposed Support Structures Support Structures Exudate Amount: Medium Medium Medium Exudate Type: Serous Serous Serous Exudate Color: amber amber amber Wound Margin: Flat and Intact Flat and Intact Flat and Intact Granulation Amount: Large (67-100%) Small (1-33%) Small (1-33%) Granulation Quality: Pink  Pink Pink Necrotic Amount: Small (1-33%) Large (67-100%) Large (67-100%) Necrotic Tissue: Eschar, Adherent Slough Adherent [employer] Exposed Structures: Fat Layer (Subcutaneous Tissue) Fat Layer (Subcutaneous Tissue) Fat Layer (Subcutaneous Tissue) Exposed: Yes Exposed: Yes Exposed: Yes Fascia: No Fascia: No Fascia: No Tendon: No Tendon: No Tendon: No Muscle: No Muscle: No Muscle: No Joint: No Joint: No Joint: No Bone: No Bone: No Bone: No Epithelialization: Small (1-33%) None None [name] [name], [name] [name] ([id number]) Treatment Notes Electronic Signature(s) Signed: 05/04/2019 4:39:31 PM By: Curtis Sites Entered By: Curtis Sites on 05/04/2019 15:26:21 [name] [name] ([id number]) -------------------------------------------------------------------------------- Multi-Disciplinary Care Plan Details Patient Name: [name] [name] Date of Service: 05/04/2019 2:15 PM Medical Record Number: [id number] Patient Account Number: [id number] Date of Birth/Sex: 1960/02/26 (59 y.o. M) Treating RN: Curtis Sites Primary Care Stephonie Wilcoxen: Marvell Fuller Other Clinician: Referring Dishon Kehoe: Marvell Fuller Treating Tiphanie Vo/Extender: Linwood Dibbles, HOYT Weeks in Treatment: 0 Active Inactive Abuse / Safety / Falls / Self Care Management Nursing Diagnoses: Potential for falls Goals: Patient will remain injury free related to falls Date Initiated: 05/04/2019 Target Resolution Date: 07/21/2019 Goal Status: Active Interventions: Assess fall risk on admission and as needed Provide education on basic hygiene Notes: Nutrition Nursing Diagnoses: Impaired glucose control: actual or potential Goals: Patient/caregiver agrees to and verbalizes understanding of need to use nutritional supplements and/or vitamins as prescribed Date Initiated: 05/04/2019 Target Resolution Date: 07/21/2019 Goal Status: Active Interventions: Assess patient nutrition upon admission and as needed per  policy Notes: Orientation to the Wound Care Program Nursing Diagnoses: Knowledge deficit related to the wound healing center program Goals: Patient/caregiver will verbalize understanding of the  Wound Healing Center Program Date Initiated: 05/04/2019 Target Resolution Date: 07/21/2019 Goal Status: Active Interventions: Provide education on orientation to the wound center Notes: Venous Leg Ulcer Nursing Diagnoses: Potential for venous Insuffiency (use before diagnosis confirmed) [name] [name], [name] [name] ([id number]) Goals: Patient will maintain optimal edema control Date Initiated: 05/04/2019 Target Resolution Date: 07/21/2019 Goal Status: Active Interventions: Compression as ordered Notes: Wound/Skin Impairment Nursing Diagnoses: Impaired tissue integrity Goals: Ulcer/skin breakdown will heal within 14 weeks Date Initiated: 05/04/2019 Target Resolution Date: 07/21/2019 Goal Status: Active Interventions: Assess patient/caregiver ability to obtain necessary supplies Assess patient/caregiver ability to perform ulcer/skin care regimen upon admission and as needed Assess ulceration(s) every visit Notes: Electronic Signature(s) Signed: 05/04/2019 4:39:31 PM By: Montey Hora Entered By: Montey Hora on 05/04/2019 15:26:10 [name] [name] ([id number]) -------------------------------------------------------------------------------- Pain Assessment Details Patient Name: [name] [name] Date of Service: 05/04/2019 2:15 PM Medical Record Number: [id number] Patient Account Number: [id number] Date of Birth/Sex: 08-16-1960 (59 y.o. M) Treating RN: Montey Hora Primary Care Laurelyn Terrero: Laverna Peace Other Clinician: Referring Calle Schader: Laverna Peace Treating Talyn Eddie/Extender: STONE III, HOYT Weeks in Treatment: 0 Active Problems Location of Pain Severity and Description of Pain Patient Has Paino Yes Site Locations Pain Management and Medication Current Pain Management: Electronic  Signature(s) Signed: 05/04/2019 4:39:31 PM By: Montey Hora Entered By: Montey Hora on 05/04/2019 14:50:56 [name] [name] ([id number]) -------------------------------------------------------------------------------- Patient/Caregiver Education Details Patient Name: [name] [name] Date of Service: 05/04/2019 2:15 PM Medical Record Number: [id number] Patient Account Number: [id number] Date of Birth/Gender: Mar 30, 1960 (59 y.o. M) Treating RN: Montey Hora Primary Care Physician: Laverna Peace Other Clinician: Referring Physician: Laverna Peace Treating Physician/Extender: Melburn Hake, HOYT Weeks in Treatment: 0 Education Assessment Education Provided To: Patient and Caregiver Education Topics Provided Venous: Handouts: Other: need for ongoing compression Methods: Explain/Verbal Responses: State content correctly Electronic Signature(s) Signed: 05/04/2019 4:39:31 PM By: Montey Hora Entered By: Montey Hora on 05/04/2019 15:27:23 [name] [name] ([id number]) -------------------------------------------------------------------------------- Wound Assessment Details Patient Name: [name] [name] Date of Service: 05/04/2019 2:15 PM Medical Record Number: [id number] Patient Account Number: [id number] Date of Birth/Sex: August 23, 1960 (59 y.o. M) Treating RN: Montey Hora Primary Care Cheyenne Schumm: Laverna Peace Other Clinician: Referring Keirstyn Aydt: Laverna Peace Treating Verlean Allport/Extender: STONE III, HOYT Weeks in Treatment: 0 Wound Status Wound Number: 3 Primary Diabetic Wound/Ulcer of the Lower Extremity Etiology: Wound Location: Right Lower Leg - Lateral, Proximal Wound Open Wounding Event: Gradually Appeared Status: Date Acquired: 02/16/2019 Comorbid Asthma, Chronic Obstructive Pulmonary Disease (COPD), Weeks Of Treatment: 0 History: Sleep Apnea, Arrhythmia, Congestive Heart Failure, Clustered Wound: No Hypertension, Myocardial Infarction, Type II  Diabetes Photos Wound Measurements Length: (cm) 0.8 Width: (cm) 0.6 Depth: (cm) 0.1 Area: (cm) 0.377 Volume: (cm) 0.038 % Reduction in Area: % Reduction in Volume: Epithelialization: Small (1-33%) Tunneling: No Undermining: No Wound Description Classification: Grade 1 Wound Margin: Flat and Intact Exudate Amount: Medium Exudate Type: Serous Exudate Color: amber Foul Odor After Cleansing: No Slough/Fibrino Yes Wound Bed Granulation Amount: Large (67-100%) Exposed Structure Granulation Quality: Pink Fascia Exposed: No Necrotic Amount: Small (1-33%) Fat Layer (Subcutaneous Tissue) Exposed: Yes Necrotic Quality: Eschar, Adherent Slough Tendon Exposed: No Muscle Exposed: No Joint Exposed: No Bone Exposed: No Treatment Notes Wound #3 (Right, Proximal, Lateral Lower Leg) Notes silvercel, abd, unna boots bilateral Electronic Signature(sKASSIDY, [name] [name] ([id number]) Signed: 05/04/2019 4:39:31 PM By: Montey Hora Entered By: Montey Hora on 05/04/2019 15:08:32 [name] [name] ([id number]) -------------------------------------------------------------------------------- Wound Assessment Details Patient Name: [name] [name] Date of Service: 05/04/2019 2:15 PM Medical Record Number: [id number] Patient Account Number: [id number] Date of Birth/Sex: 1960/08/30 (58  y.o. M) Treating RN: Curtis Sites Primary Care Reighlynn Swiney: Marvell Fuller Other Clinician: Referring Kennethia Lynes: Marvell Fuller Treating Wallis Spizzirri/Extender: STONE III, HOYT Weeks in Treatment: 0 Wound Status Wound Number: 4 Primary Lymphedema Etiology: Wound Location: Right Lower Leg - Lateral, Distal Wound Open Wounding Event: Gradually Appeared Status: Date Acquired: 02/12/2019 Comorbid Asthma, Chronic Obstructive Pulmonary Disease (COPD), Weeks Of Treatment: 0 History: Sleep Apnea, Arrhythmia, Congestive Heart Failure, Clustered Wound: No Hypertension, Myocardial Infarction, Type II  Diabetes Photos Wound Measurements Length: (cm) 2.3 Width: (cm) 5.5 Depth: (cm) 0.1 Area: (cm) 9.935 Volume: (cm) 0.994 % Reduction in Area: % Reduction in Volume: Epithelialization: None Tunneling: No Undermining: No Wound Description Classification: Full Thickness Without Exposed Support Struc Wound Margin: Flat and Intact Exudate Amount: Medium Exudate Type: Serous Exudate Color: amber tures Foul Odor After Cleansing: No Slough/Fibrino Yes Wound Bed Granulation Amount: Small (1-33%) Exposed Structure Granulation Quality: Pink Fascia Exposed: No Necrotic Amount: Large (67-100%) Fat Layer (Subcutaneous Tissue) Exposed: Yes Necrotic Quality: Adherent Slough Tendon Exposed: No Muscle Exposed: No Joint Exposed: No Bone Exposed: No Treatment Notes Wound #4 (Right, Distal, Lateral Lower Leg) Notes silvercel, abd, unna boots bilateral Electronic Signature([name] [name], [name] [name] ([id number]) Signed: 05/04/2019 4:39:31 PM By: Curtis Sites Entered By: Curtis Sites on 05/04/2019 15:09:36 [name] [name] ([id number]) -------------------------------------------------------------------------------- Wound Assessment Details Patient Name: [name] [name] Date of Service: 05/04/2019 2:15 PM Medical Record Number: [id number] Patient Account Number: [id number] Date of Birth/Sex: [birth year] (59 y.o. M) Treating RN: Curtis Sites Primary Care Jaycee Pelzer: Marvell Fuller Other Clinician: Referring Giada Schoppe: Marvell Fuller Treating Ysabella Babiarz/Extender: STONE III, HOYT Weeks in Treatment: 0 Wound Status Wound Number: 5 Primary Lymphedema Etiology: Wound Location: Left Lower Leg - Lateral Wound Open Wounding Event: Gradually Appeared Status: Date Acquired: 02/12/2019 Comorbid Asthma, Chronic Obstructive Pulmonary Disease (COPD), Weeks Of Treatment: 0 History: Sleep Apnea, Arrhythmia, Congestive Heart Failure, Clustered Wound: Yes Hypertension, Myocardial Infarction, Type  II Diabetes Photos Wound Measurements Length: (cm) 3.5 Width: (cm) 7.2 Depth: (cm) 0.1 Clustered Quantity: 2 Area: (cm) 19.792 Volume: (cm) 1.979 % Reduction in Area: 0% % Reduction in Volume: 0% Epithelialization: None Tunneling: No Undermining: No Wound Description Classification: Full Thickness Without Exposed Support Struc Wound Margin: Flat and Intact Exudate Amount: Medium Exudate Type: Serous Exudate Color: amber tures Foul Odor After Cleansing: No Slough/Fibrino Yes Wound Bed Granulation Amount: Small (1-33%) Exposed Structure Granulation Quality: Pink Fascia Exposed: No Necrotic Amount: Large (67-100%) Fat Layer (Subcutaneous Tissue) Exposed: Yes Necrotic Quality: Adherent Slough Tendon Exposed: No Muscle Exposed: No Joint Exposed: No Bone Exposed: No Treatment Notes Wound #5 (Left, Lateral Lower Leg) Notes silvercel, abd, unna boots bilateral [name] [name], [name] [name] ([id number]) Electronic Signature(s) Signed: 05/04/2019 4:39:31 PM By: Curtis Sites Entered By: Curtis Sites on 05/04/2019 15:12:00 [name] [name] ([id number]) -------------------------------------------------------------------------------- Vitals Details Patient Name: [name] [name] Date of Service: 05/04/2019 2:15 PM Medical Record Number: [id number] Patient Account Number: [id number] Date of Birth/Sex: [birth year] (59 y.o. M) Treating RN: Curtis Sites Primary Care Alyx Gee: Marvell Fuller Other Clinician: Referring Verla Bryngelson: Marvell Fuller Treating Mekhi Lascola/Extender: STONE III, HOYT Weeks in Treatment: 0 Vital Signs Time Taken: 14:50 Temperature (F): 99.3 Height (in): 68 Pulse (bpm): 70 Source: Stated Respiratory Rate (breaths/min): 18 Weight (lbs): 400 Blood Pressure (mmHg): 165/62 Source: Stated Reference Range: 80 - 120 mg / dl Body Mass Index (BMI): 60.8 Electronic Signature(s) Signed: 05/04/2019 4:39:31 PM By: Curtis Sites Entered By: Curtis Sites on  05/04/2019 14:51:29

## 2019-05-04 NOTE — Progress Notes (Signed)
LOVELL, NUTTALL ([id number]) Visit Report for 05/04/2019 Chief Complaint Document Details Patient Name: [name] [name], [name] [name] Date of Service: 05/04/2019 2:15 PM Medical Record Number: [id number] Patient Account Number: [id number] Date of Birth/Sex: 08-24-1960 (59 y.o. M) Treating RN: Montey Hora Primary Care Provider: Laverna Peace Other Clinician: Referring Provider: Laverna Peace Treating Provider/Extender: Melburn Hake, Omayra Tulloch Weeks in Treatment: 0 Information Obtained from: Patient Chief Complaint Bilateral LE ulcers Electronic Signature(s) Signed: 05/04/2019 3:18:18 PM By: Worthy Keeler PA-C Entered By: Worthy Keeler on 05/04/2019 15:18:18 [name] [name] ([id number]) -------------------------------------------------------------------------------- HPI Details Patient Name: [name] [name] Date of Service: 05/04/2019 2:15 PM Medical Record Number: [id number] Patient Account Number: [id number] Date of Birth/Sex: 07/06/1960 (59 y.o. M) Treating RN: Montey Hora Primary Care Provider: Laverna Peace Other Clinician: Referring Provider: Laverna Peace Treating Provider/Extender: Melburn Hake, Litsy Epting Weeks in Treatment: 0 History of Present Illness HPI Description: 01/18/2019 on evaluation today patient presents for initial evaluation here in our clinic concerning issues he is been having with his bilateral lower extremities. He appears based on what I am seeing to have bilateral lower extremity cellulitis along with chronic venous stasis. He has significant pitting edema even in the bottom of his foot. With that being said he also has a history of hypertension, COPD, congestive heart failure, long-term use of anticoagulants due to atrial fibrillation which is paroxysmal as well as having had a pulmonary embolism in the past. With that being said he tells me that he has constant drainage out of his left lower extremity. This is likely due to the fact that again he has  significant swelling pretty much all the time. With that being said in the past 2 weeks this has been getting worse and he tells me at this point that he feels like it is infected is also hurting a lot more than it has been in the past and the drainage is a different color. The left side is definitely worse than the right. Fortunately there is no signs of active infection systemically although I am concerned due to the severity of the infection that I see in the left lower extremity especially. There is a lot of necrotic tissue on the surface of both wounds that at some point is going to need to be cleaned off to allow these to heal but right now obviously this is much too tender for him. No fevers, chills, nausea, vomiting, or diarrhea. 02/02/2019 on evaluation today patient presents for follow-up concerning the wounds over his bilateral lower extremities. He is continuing to manage. He was in the hospital and has been discharged at this point. He was admitted from 01/21/2019 through 01/27/2019. This was due to cellulitis of lower extremity. Nonetheless he does seem to be doing much better at this point. I did review his discharge summary as well he did not appear to have any positive findings on blood cultures. His white blood cell count was never really elevated either which is also good news. He was treated with IV clindamycin for 4 days and then switch to complete a 10-day course at home which she is still working on at this point. They did have him on IV Lasix in the hospital which helped him diurese well on discharge they stated that his weight was 408 pounds which is still higher than his dry weight of 375 to 380 pounds. Upon discharge his torsemide was increased from 40 to 60 mg twice daily and he is to follow-up with cardiology as well. 12/29-Patient returns at 2 weeks  for evaluation and follow-up of his bilateral lower extremity wounds, with lymphedema, venous ulcerations, patient was  recently hospitalized and was aggressively diuresed after which his torsemide has been increased. He follows up with cardiology as well. His left leg stasis wounds are more moist and have maceration of tissue, the right one is less so. We are using silver cell and unna compression given the degree of edema in the legs. Readmission: 05/04/2019 patient presents today for reevaluation here in the clinic I have not seen him since December 2020. Subsequently this is a fairly sick individual with multiple comorbidities who presents for follow-up concerning wounds of his bilateral lower extremities. He tells me that after he was in the hospital following when we saw him last in December that he ended up subsequently being discharged with home health services and they have been coming out and applying Unna boot wraps. With that being said he has used up all of his home health visits for the year and then for the last week has not had anything on his legs as far as the wrap is concerned. Obviously this is not good and he does come in today with increased swelling or at least what I perceived to be along with somewhat dry wounds again he does not seem to be weeping too much at this point which is at least good news but he has significant lymphedema/venous stasis. No fevers, chills, nausea, vomiting, or diarrhea. Electronic Signature(s) Signed: 05/04/2019 3:29:14 PM By: Lenda Kelp PA-C Entered By: Lenda Kelp on 05/04/2019 15:29:14 [name] [name], [name] [name] ([id number]) -------------------------------------------------------------------------------- Physical Exam Details Patient Name: [name] [name] Date of Service: 05/04/2019 2:15 PM Medical Record Number: [id number] Patient Account Number: [id number] Date of Birth/Sex: [birth year] (59 y.o. M) Treating RN: Curtis Sites Primary Care Provider: Marvell Fuller Other Clinician: Referring Provider: Marvell Fuller Treating Provider/Extender: STONE III,  Jesusita Jocelyn Weeks in Treatment: 0 Constitutional patient is hypertensive.. pulse regular and within target range for patient.[name] respirations regular, non-labored and within target range for patient.[name] temperature within target range for patient.. Obese and well-hydrated in no acute distress. Eyes conjunctiva clear no eyelid edema noted. pupils equal round and reactive to light and accommodation. Ears, Nose, Mouth, and Throat no gross abnormality of ear auricles or external auditory canals. normal hearing noted during conversation. mucus membranes moist. Respiratory normal breathing without difficulty. Musculoskeletal Patient unable to walk without assistance. Psychiatric this patient is able to make decisions and demonstrates good insight into disease process. Alert and Oriented x 3. pleasant and cooperative. Notes Patient's wound bed currently showed signs of slough noted on the surface of the wounds at this time. Fortunately there is no evidence of active infection. With that being said there does not appear to be any evidence of active significant weeping although he is significantly swollen he does have lymphedema and I am sure once we compressing these that have more drainage and weeping following. With that being said his blood pressure was also elevated today in general he appears to be a very sick individual. Electronic Signature(s) Signed: 05/04/2019 3:30:26 PM By: Lenda Kelp PA-C Entered By: Lenda Kelp on 05/04/2019 15:30:25 [name] [name] ([id number]) -------------------------------------------------------------------------------- Physician Orders Details Patient Name: [name] [name] Date of Service: 05/04/2019 2:15 PM Medical Record Number: [id number] Patient Account Number: [id number] Date of Birth/Sex: [birth year] (59 y.o. M) Treating RN: Curtis Sites Primary Care Provider: Marvell Fuller Other Clinician: Referring Provider: Marvell Fuller Treating  Provider/Extender: STONE III, Kilian Schwartz Weeks in Treatment: 0 Verbal / Phone Orders:  No Diagnosis Coding ICD-10 Coding Code Description I87.2 Venous insufficiency (chronic) (peripheral) I89.0 Lymphedema, not elsewhere classified L97.812 Non-pressure chronic ulcer of other part of right lower leg with fat layer exposed L97.822 Non-pressure chronic ulcer of other part of left lower leg with fat layer exposed I10 Essential (primary) hypertension J44.9 Chronic obstructive pulmonary disease, unspecified Z79.01 Long term (current) use of anticoagulants I48.0 Paroxysmal atrial fibrillation Z86.711 Personal history of pulmonary embolism I50.42 Chronic combined systolic (congestive) and diastolic (congestive) heart failure Wound Cleansing Wound #3 Right,Proximal,Lateral Lower Leg o Cleanse wound with mild soap and water o May shower with protection. Wound #4 Right,Distal,Lateral Lower Leg o Cleanse wound with mild soap and water o May shower with protection. Wound #5 Left,Lateral Lower Leg o Cleanse wound with mild soap and water o May shower with protection. Primary Wound Dressing Wound #3 Right,Proximal,Lateral Lower Leg o Silver Alginate Wound #4 Right,Distal,Lateral Lower Leg o Silver Alginate Wound #5 Left,Lateral Lower Leg o Silver Alginate Secondary Dressing Wound #3 Right,Proximal,Lateral Lower Leg o ABD pad Wound #4 Right,Distal,Lateral Lower Leg o ABD pad Wound #5 Left,Lateral Lower Leg o ABD pad Dressing Change Frequency Wound #3 Right,Proximal,Lateral Lower Leg o Change dressing every week [name] [name], [name] [name] ([id number]) Wound #4 Right,Distal,Lateral Lower Leg o Change dressing every week Wound #5 Left,Lateral Lower Leg o Change dressing every week Follow-up Appointments o Return Appointment in 1 week. Edema Control Wound #3 Right,Proximal,Lateral Lower Leg o Unna Boots Bilaterally o Elevate legs to the level of the heart and pump  ankles as often as possible Wound #4 Right,Distal,Lateral Lower Leg o Unna Boots Bilaterally o Elevate legs to the level of the heart and pump ankles as often as possible Wound #5 Left,Lateral Lower Leg o Unna Boots Bilaterally o Elevate legs to the level of the heart and pump ankles as often as possible Services and Therapies o Ankle Brachial Index (ABI) o Venous Studies -Bilateral Electronic Signature(s) Signed: 05/04/2019 4:37:51 PM By: Lenda Kelp PA-C Signed: 05/04/2019 4:39:31 PM By: Curtis Sites Entered By: Curtis Sites on 05/04/2019 15:29:13 [name] [name] ([id number]) -------------------------------------------------------------------------------- Problem List Details Patient Name: [name] [name] Date of Service: 05/04/2019 2:15 PM Medical Record Number: [id number] Patient Account Number: [id number] Date of Birth/Sex: September 18, 1960 (59 y.o. M) Treating RN: Curtis Sites Primary Care Provider: Marvell Fuller Other Clinician: Referring Provider: Marvell Fuller Treating Provider/Extender: Linwood Dibbles, Richard Holz Weeks in Treatment: 0 Active Problems ICD-10 Evaluated Encounter Code Description Active Date Today Diagnosis I87.2 Venous insufficiency (chronic) (peripheral) 05/04/2019 No Yes I89.0 Lymphedema, not elsewhere classified 05/04/2019 No Yes L97.812 Non-pressure chronic ulcer of other part of right lower leg with fat layer 05/04/2019 No Yes exposed L97.822 Non-pressure chronic ulcer of other part of left lower leg with fat layer 05/04/2019 No Yes exposed I10 Essential (primary) hypertension 05/04/2019 No Yes J44.9 Chronic obstructive pulmonary disease, unspecified 05/04/2019 No Yes Z79.01 Long term (current) use of anticoagulants 05/04/2019 No Yes I48.0 Paroxysmal atrial fibrillation 05/04/2019 No Yes Z86.711 Personal history of pulmonary embolism 05/04/2019 No Yes I50.42 Chronic combined systolic (congestive) and diastolic (congestive) heart 05/04/2019 No  Yes failure Inactive Problems Resolved Problems [name] [name], [name] [name] ([id number]) Electronic Signature(s) Signed: 05/04/2019 3:18:59 PM By: Lenda Kelp PA-C Previous Signature: 05/04/2019 3:18:06 PM Version By: Lenda Kelp PA-C Entered By: Lenda Kelp on 05/04/2019 15:18:58 [name] [name] ([id number]) -------------------------------------------------------------------------------- Progress Note Details Patient Name: [name] [name] Date of Service: 05/04/2019 2:15 PM Medical Record Number: [id number] Patient Account Number: [id number] Date of Birth/Sex: 05/30/1960 (59 y.o. M) Treating RN: Curtis Sites  Primary Care Provider: Marvell Fuller Other Clinician: Referring Provider: Marvell Fuller Treating Provider/Extender: Linwood Dibbles, Ediberto Sens Weeks in Treatment: 0 Subjective Chief Complaint Information obtained from Patient Bilateral LE ulcers History of Present Illness (HPI) 01/18/2019 on evaluation today patient presents for initial evaluation here in our clinic concerning issues he is been having with his bilateral lower extremities. He appears based on what I am seeing to have bilateral lower extremity cellulitis along with chronic venous stasis. He has significant pitting edema even in the bottom of his foot. With that being said he also has a history of hypertension, COPD, congestive heart failure, long-term use of anticoagulants due to atrial fibrillation which is paroxysmal as well as having had a pulmonary embolism in the past. With that being said he tells me that he has constant drainage out of his left lower extremity. This is likely due to the fact that again he has significant swelling pretty much all the time. With that being said in the past 2 weeks this has been getting worse and he tells me at this point that he feels like it is infected is also hurting a lot more than it has been in the past and the drainage is a different color. The left side is definitely worse  than the right. Fortunately there is no signs of active infection systemically although I am concerned due to the severity of the infection that I see in the left lower extremity especially. There is a lot of necrotic tissue on the surface of both wounds that at some point is going to need to be cleaned off to allow these to heal but right now obviously this is much too tender for him. No fevers, chills, nausea, vomiting, or diarrhea. 02/02/2019 on evaluation today patient presents for follow-up concerning the wounds over his bilateral lower extremities. He is continuing to manage. He was in the hospital and has been discharged at this point. He was admitted from 01/21/2019 through 01/27/2019. This was due to cellulitis of lower extremity. Nonetheless he does seem to be doing much better at this point. I did review his discharge summary as well he did not appear to have any positive findings on blood cultures. His white blood cell count was never really elevated either which is also good news. He was treated with IV clindamycin for 4 days and then switch to complete a 10-day course at home which she is still working on at this point. They did have him on IV Lasix in the hospital which helped him diurese well on discharge they stated that his weight was 408 pounds which is still higher than his dry weight of 375 to 380 pounds. Upon discharge his torsemide was increased from 40 to 60 mg twice daily and he is to follow-up with cardiology as well. 12/29-Patient returns at 2 weeks for evaluation and follow-up of his bilateral lower extremity wounds, with lymphedema, venous ulcerations, patient was recently hospitalized and was aggressively diuresed after which his torsemide has been increased. He follows up with cardiology as well. His left leg stasis wounds are more moist and have maceration of tissue, the right one is less so. We are using silver cell and unna compression given the degree of edema in the  legs. Readmission: 05/04/2019 patient presents today for reevaluation here in the clinic I have not seen him since December 2020. Subsequently this is a fairly sick individual with multiple comorbidities who presents for follow-up concerning wounds of his bilateral lower extremities. He tells  me that after he was in the hospital following when we saw him last in December that he ended up subsequently being discharged with home health services and they have been coming out and applying Unna boot wraps. With that being said he has used up all of his home health visits for the year and then for the last week has not had anything on his legs as far as the wrap is concerned. Obviously this is not good and he does come in today with increased swelling or at least what I perceived to be along with somewhat dry wounds again he does not seem to be weeping too much at this point which is at least good news but he has significant lymphedema/venous stasis. No fevers, chills, nausea, vomiting, or diarrhea. Patient History Information obtained from Patient. Allergies No Known Drug Allergies Family History Cancer - Father,Mother, Heart Disease - Mother,Maternal Grandparents, Hypertension - Maternal Grandparents,Mother, No family history of Diabetes, Hereditary Spherocytosis, Kidney Disease, Lung Disease, Seizures, Stroke, Thyroid Problems, Tuberculosis. Social History Never smoker, Marital Status - Widowed, Alcohol Use - Rarely, Drug Use - No History, Caffeine Use - Daily. Medical History Respiratory Patient has history of Asthma, Chronic Obstructive Pulmonary Disease (COPD), Sleep Apnea Denies history of Aspiration, Pneumothorax, Tuberculosis [name] [name], [name] [name] ([id number]) Cardiovascular Patient has history of Arrhythmia - a fib, Congestive Heart Failure, Hypertension, Myocardial Infarction - 2010 Denies history of Angina, Coronary Artery Disease, Deep Vein Thrombosis, Hypotension, Peripheral Arterial  Disease, Peripheral Venous Disease, Phlebitis, Vasculitis Endocrine Patient has history of Type II Diabetes Denies history of Type I Diabetes Immunological Denies history of Lupus Erythematosus, Raynaud s, Scleroderma Integumentary (Skin) Denies history of History of Burn, History of pressure wounds Musculoskeletal Denies history of Gout, Rheumatoid Arthritis, Osteoarthritis, Osteomyelitis Neurologic Denies history of Dementia, Neuropathy, Quadriplegia, Paraplegia, Seizure Disorder Medical And Surgical History Notes Respiratory chronic resp failure, PE Musculoskeletal back pain Neurologic TBI Review of Systems (ROS) Constitutional Symptoms (General Health) Denies complaints or symptoms of Fatigue, Fever, Chills, Marked Weight Change. Eyes Denies complaints or symptoms of Dry Eyes, Vision Changes, Glasses / Contacts. Ear/Nose/Mouth/Throat Denies complaints or symptoms of Difficult clearing ears, Sinusitis. Hematologic/Lymphatic Denies complaints or symptoms of Bleeding / Clotting Disorders, Human Immunodeficiency Virus. Gastrointestinal Denies complaints or symptoms of Frequent diarrhea, Nausea, Vomiting. Endocrine Denies complaints or symptoms of Hepatitis, Thyroid disease, Polydypsia (Excessive Thirst). Genitourinary Denies complaints or symptoms of Kidney failure/ Dialysis, Incontinence/dribbling. Immunological Denies complaints or symptoms of Hives, Itching. Integumentary (Skin) Complains or has symptoms of Wounds, Swelling. Denies complaints or symptoms of Bleeding or bruising tendency, Breakdown. Psychiatric Denies complaints or symptoms of Anxiety, Claustrophobia. Objective Constitutional patient is hypertensive.. pulse regular and within target range for patient.[name] respirations regular, non-labored and within target range for patient.[name] temperature within target range for patient.. Obese and well-hydrated in no acute distress. Vitals Time Taken: 2:50 PM, Height: 68  in, Source: Stated, Weight: 400 lbs, Source: Stated, BMI: 60.8, Temperature: 99.3 F, Pulse: 70 bpm, Respiratory Rate: 18 breaths/min, Blood Pressure: 165/62 mmHg. Eyes conjunctiva clear no eyelid edema noted. pupils equal round and reactive to light and accommodation. Ears, Nose, Mouth, and Throat no gross abnormality of ear auricles or external auditory canals. normal hearing noted during conversation. mucus membranes moist. Mcmahill, Johan ([id number]) Respiratory normal breathing without difficulty. Musculoskeletal Patient unable to walk without assistance. Psychiatric this patient is able to make decisions and demonstrates good insight into disease process. Alert and Oriented x 3. pleasant and cooperative. General Notes: Patient's wound bed currently showed signs  of slough noted on the surface of the wounds at this time. Fortunately there is no evidence of active infection. With that being said there does not appear to be any evidence of active significant weeping although he is significantly swollen he does have lymphedema and I am sure once we compressing these that have more drainage and weeping following. With that being said his blood pressure was also elevated today in general he appears to be a very sick individual. Integumentary (Hair, Skin) Wound #3 status is Open. Original cause of wound was Gradually Appeared. The wound is located on the Right,Proximal,Lateral Lower Leg. The wound measures 0.8cm length x 0.6cm width x 0.1cm depth; 0.377cm^2 area and 0.038cm^3 volume. There is Fat Layer (Subcutaneous Tissue) Exposed exposed. There is no tunneling or undermining noted. There is a medium amount of serous drainage noted. The wound margin is flat and intact. There is large (67-100%) pink granulation within the wound bed. There is a small (1-33%) amount of necrotic tissue within the wound bed including Eschar and Adherent Slough. Wound #4 status is Open. Original cause of wound was  Gradually Appeared. The wound is located on the Right,Distal,Lateral Lower Leg. The wound measures 2.3cm length x 5.5cm width x 0.1cm depth; 9.935cm^2 area and 0.994cm^3 volume. There is Fat Layer (Subcutaneous Tissue) Exposed exposed. There is no tunneling or undermining noted. There is a medium amount of serous drainage noted. The wound margin is flat and intact. There is small (1-33%) pink granulation within the wound bed. There is a large (67-100%) amount of necrotic tissue within the wound bed including Adherent Slough. Wound #5 status is Open. Original cause of wound was Gradually Appeared. The wound is located on the Left,Lateral Lower Leg. The wound measures 3.5cm length x 7.2cm width x 0.1cm depth; 19.792cm^2 area and 1.979cm^3 volume. There is Fat Layer (Subcutaneous Tissue) Exposed exposed. There is no tunneling or undermining noted. There is a medium amount of serous drainage noted. The wound margin is flat and intact. There is small (1-33%) pink granulation within the wound bed. There is a large (67-100%) amount of necrotic tissue within the wound bed including Adherent Slough. Assessment Active Problems ICD-10 Venous insufficiency (chronic) (peripheral) Lymphedema, not elsewhere classified Non-pressure chronic ulcer of other part of right lower leg with fat layer exposed Non-pressure chronic ulcer of other part of left lower leg with fat layer exposed Essential (primary) hypertension Chronic obstructive pulmonary disease, unspecified Long term (current) use of anticoagulants Paroxysmal atrial fibrillation Personal history of pulmonary embolism Chronic combined systolic (congestive) and diastolic (congestive) heart failure Procedures Wound #3 Pre-procedure diagnosis of Wound #3 is a Diabetic Wound/Ulcer of the Lower Extremity located on the Right,Proximal,Lateral Lower Leg . There was a Radio broadcast assistant Compression Therapy Procedure by Curtis Sites, RN. Post procedure Diagnosis  Wound #3: Same as Pre-Procedure Wound #4 Pre-procedure diagnosis of Wound #4 is a Lymphedema located on the Right,Distal,Lateral Lower Leg . There was a Radio broadcast assistant Compression Therapy Procedure by Curtis Sites, RN. Post procedure Diagnosis Wound #4: Same as Pre-Procedure [name] [name], [name] [name] ([id number]) Wound #5 Pre-procedure diagnosis of Wound #5 is a Lymphedema located on the Left,Lateral Lower Leg . There was a Radio broadcast assistant Compression Therapy Procedure by Curtis Sites, RN. Post procedure Diagnosis Wound #5: Same as Pre-Procedure Plan Wound Cleansing: Wound #3 Right,Proximal,Lateral Lower Leg: Cleanse wound with mild soap and water May shower with protection. Wound #4 Right,Distal,Lateral Lower Leg: Cleanse wound with mild soap and water May shower with protection. Wound #5 Left,Lateral Lower  Leg: Cleanse wound with mild soap and water May shower with protection. Primary Wound Dressing: Wound #3 Right,Proximal,Lateral Lower Leg: Silver Alginate Wound #4 Right,Distal,Lateral Lower Leg: Silver Alginate Wound #5 Left,Lateral Lower Leg: Silver Alginate Secondary Dressing: Wound #3 Right,Proximal,Lateral Lower Leg: ABD pad Wound #4 Right,Distal,Lateral Lower Leg: ABD pad Wound #5 Left,Lateral Lower Leg: ABD pad Dressing Change Frequency: Wound #3 Right,Proximal,Lateral Lower Leg: Change dressing every week Wound #4 Right,Distal,Lateral Lower Leg: Change dressing every week Wound #5 Left,Lateral Lower Leg: Change dressing every week Follow-up Appointments: Return Appointment in 1 week. Edema Control: Wound #3 Right,Proximal,Lateral Lower Leg: Unna Boots Bilaterally Elevate legs to the level of the heart and pump ankles as often as possible Wound #4 Right,Distal,Lateral Lower Leg: Unna Boots Bilaterally Elevate legs to the level of the heart and pump ankles as often as possible Wound #5 Left,Lateral Lower Leg: Unna Boots Bilaterally Elevate legs to the level of the heart  and pump ankles as often as possible Services and Therapies ordered were: Ankle Brachial Index (ABI), Venous Studies -Bilateral 1. My suggestion at this time is good to be that we go ahead and initiate treatment with an Unna boot wrap bilaterally he has been tolerating this without any complication and to be honest I think that is still appropriate to continue. 2. We will use silver alginate dressings to the wound beds in order to help with controlling the drainage obviously this is I think go to be more significant once we get things compressed appropriately. 3. I am also can recommend patient needs to elevate his legs much as possible to be honest I am not sure that he really does this much at all. 4. I am also can recommend at this time that the patient needs to go to vascular for arterial studies with ABI and TBI obviously the sooner he can do this the better. Also think he may need venous testing but again the arterial side is more important initially my opinion for being able to better wrap him with a compression wrap. [name] [name], [name] [name] ([id number]) We will see patient back for reevaluation in 1 week here in the clinic. If anything worsens or changes patient will contact our office for additional recommendations. Electronic Signature(s) Signed: 05/04/2019 3:31:24 PM By: Lenda Kelp PA-C Entered By: Lenda Kelp on 05/04/2019 15:31:23 [name] [name], [name] [name] ([id number]) -------------------------------------------------------------------------------- ROS/PFSH Details Patient Name: [name] [name] Date of Service: 05/04/2019 2:15 PM Medical Record Number: [id number] Patient Account Number: [id number] Date of Birth/Sex: August 16, 1960 (59 y.o. M) Treating RN: Curtis Sites Primary Care Provider: Marvell Fuller Other Clinician: Referring Provider: Marvell Fuller Treating Provider/Extender: STONE III, Anshika Pethtel Weeks in Treatment: 0 Information Obtained From Patient Constitutional Symptoms  (General Health) Complaints and Symptoms: Negative for: Fatigue; Fever; Chills; Marked Weight Change Eyes Complaints and Symptoms: Negative for: Dry Eyes; Vision Changes; Glasses / Contacts Ear/Nose/Mouth/Throat Complaints and Symptoms: Negative for: Difficult clearing ears; Sinusitis Hematologic/Lymphatic Complaints and Symptoms: Negative for: Bleeding / Clotting Disorders; Human Immunodeficiency Virus Gastrointestinal Complaints and Symptoms: Negative for: Frequent diarrhea; Nausea; Vomiting Endocrine Complaints and Symptoms: Negative for: Hepatitis; Thyroid disease; Polydypsia (Excessive Thirst) Medical History: Positive for: Type II Diabetes Negative for: Type I Diabetes Treated with: Diet Genitourinary Complaints and Symptoms: Negative for: Kidney failure/ Dialysis; Incontinence/dribbling Immunological Complaints and Symptoms: Negative for: Hives; Itching Medical History: Negative for: Lupus Erythematosus; Raynaudos; Scleroderma Integumentary (Skin) Complaints and Symptoms: Positive for: Wounds; Swelling Negative for: Bleeding or bruising tendency; Breakdown Medical History: Negative for: History of Burn; History of pressure wounds  [name] [name], [name] [name] ([id number]) Psychiatric Complaints and Symptoms: Negative for: Anxiety; Claustrophobia Respiratory Medical History: Positive for: Asthma; Chronic Obstructive Pulmonary Disease (COPD); Sleep Apnea Negative for: Aspiration; Pneumothorax; Tuberculosis Past Medical History Notes: chronic resp failure, PE Cardiovascular Medical History: Positive for: Arrhythmia - a fib; Congestive Heart Failure; Hypertension; Myocardial Infarction - 2010 Negative for: Angina; Coronary Artery Disease; Deep Vein Thrombosis; Hypotension; Peripheral Arterial Disease; Peripheral Venous Disease; Phlebitis; Vasculitis Musculoskeletal Medical History: Negative for: Gout; Rheumatoid Arthritis; Osteoarthritis; Osteomyelitis Past Medical History  Notes: back pain Neurologic Medical History: Negative for: Dementia; Neuropathy; Quadriplegia; Paraplegia; Seizure Disorder Past Medical History Notes: TBI Oncologic Immunizations Pneumococcal Vaccine: Received Pneumococcal Vaccination: No Implantable Devices None Family and Social History Cancer: Yes - Father,Mother; Diabetes: No; Heart Disease: Yes - Mother,Maternal Grandparents; Hereditary Spherocytosis: No; Hypertension: Yes - Maternal Grandparents,Mother; Kidney Disease: No; Lung Disease: No; Seizures: No; Stroke: No; Thyroid Problems: No; Tuberculosis: No; Never smoker; Marital Status - Widowed; Alcohol Use: Rarely; Drug Use: No History; Caffeine Use: Daily; Financial Concerns: No; Food, Clothing or Shelter Needs: No; Support System Lacking: No; Transportation Concerns: No Electronic Signature(s) Signed: 05/04/2019 4:37:51 PM By: Lenda Kelp PA-C Signed: 05/04/2019 4:39:31 PM By: Curtis Sites Entered By: Curtis Sites on 05/04/2019 14:52:28 [name] [name] ([id number]) -------------------------------------------------------------------------------- SuperBill Details Patient Name: [name] [name] Date of Service: 05/04/2019 Medical Record Number: [id number] Patient Account Number: [id number] Date of Birth/Sex: Jun 28, 1960 (59 y.o. M) Treating RN: Curtis Sites Primary Care Provider: Marvell Fuller Other Clinician: Referring Provider: Marvell Fuller Treating Provider/Extender: Linwood Dibbles, Chadric Kimberley Weeks in Treatment: 0 Diagnosis Coding ICD-10 Codes Code Description I87.2 Venous insufficiency (chronic) (peripheral) I89.0 Lymphedema, not elsewhere classified L97.812 Non-pressure chronic ulcer of other part of right lower leg with fat layer exposed L97.822 Non-pressure chronic ulcer of other part of left lower leg with fat layer exposed I10 Essential (primary) hypertension J44.9 Chronic obstructive pulmonary disease, unspecified Z79.01 Long term (current) use of  anticoagulants I48.0 Paroxysmal atrial fibrillation Z86.711 Personal history of pulmonary embolism I50.42 Chronic combined systolic (congestive) and diastolic (congestive) heart failure Facility Procedures CPT4 Code: 24401027 Description: 99213 - WOUND CARE VISIT-LEV 3 EST PT Modifier: Quantity: 1 CPT4 Code: 25366440 Description: 29580 - APPLY UNNA BOOT/PROFO BILATERAL Modifier: Quantity: 1 Physician Procedures CPT4 Code: 3474259 Description: 99214 - WC PHYS LEVEL 4 - EST PT Modifier: Quantity: 1 CPT4 Code: Description: ICD-10 Diagnosis Description I87.2 Venous insufficiency (chronic) (peripheral) I89.0 Lymphedema, not elsewhere classified L97.812 Non-pressure chronic ulcer of other part of right lower leg with fat layer L97.822 Non-pressure chronic ulcer  of other part of left lower leg with fat layer e Modifier: exposed xposed Quantity: Electronic Signature(s) Signed: 05/04/2019 3:31:37 PM By: Lenda Kelp PA-C Entered By: Lenda Kelp on 05/04/2019 15:31:37

## 2019-05-04 NOTE — Progress Notes (Signed)
LYNARD, POSTLEWAIT ([id number]) Visit Report for 05/04/2019 Abuse/Suicide Risk Screen Details Patient Name: [name] [name], [name] [name] Date of Service: 05/04/2019 2:15 PM Medical Record Number: [id number] Patient Account Number: [id number] Date of Birth/Sex: 05-19-1960 (59 y.o. M) Treating RN: Curtis Sites Primary Care Anayeli Arel: Marvell Fuller Other Clinician: Referring Blayne Frankie: Marvell Fuller Treating Franca Stakes/Extender: STONE III, HOYT Weeks in Treatment: 0 Abuse/Suicide Risk Screen Items Answer ABUSE RISK SCREEN: Has anyone close to you tried to hurt or harm you recentlyo No Do you feel uncomfortable with anyone in your familyo No Has anyone forced you do things that you didnot want to doo No Electronic Signature(s) Signed: 05/04/2019 4:39:31 PM By: Curtis Sites Entered By: Curtis Sites on 05/04/2019 14:53:48 [name] [name] ([id number]) -------------------------------------------------------------------------------- Activities of Daily Living Details Patient Name: [name] [name] Date of Service: 05/04/2019 2:15 PM Medical Record Number: [id number] Patient Account Number: [id number] Date of Birth/Sex: 1960-11-27 (59 y.o. M) Treating RN: Curtis Sites Primary Care Undra Harriman: Marvell Fuller Other Clinician: Referring Terek Bee: Marvell Fuller Treating Brynlei Klausner/Extender: STONE III, HOYT Weeks in Treatment: 0 Activities of Daily Living Items Answer Activities of Daily Living (Please select one for each item) Drive Automobile Completely Able Take Medications Completely Able Use Telephone Completely Able Care for Appearance Completely Able Use Toilet Completely Able Bath / Shower Completely Able Dress Self Completely Able Feed Self Completely Able Walk Completely Able Get In / Out Bed Completely Able Housework Need Assistance Prepare Meals Completely Able Handle Money Completely Able Shop for Self Completely Able Electronic Signature(s) Signed: 05/04/2019 4:39:31 PM  By: Curtis Sites Entered By: Curtis Sites on 05/04/2019 14:54:17 [name] [name] ([id number]) -------------------------------------------------------------------------------- Education Screening Details Patient Name: [name] [name] Date of Service: 05/04/2019 2:15 PM Medical Record Number: [id number] Patient Account Number: [id number] Date of Birth/Sex: 08/05/1960 (59 y.o. M) Treating RN: Curtis Sites Primary Care Cash Duce: Marvell Fuller Other Clinician: Referring Azar South: Marvell Fuller Treating Dashia Caldeira/Extender: Linwood Dibbles, HOYT Weeks in Treatment: 0 Primary Learner Assessed: Patient Learning Preferences/Education Level/Primary Language Learning Preference: Explanation, Demonstration Highest Education Level: High School Preferred Language: English Cognitive Barrier Language Barrier: No Translator Needed: No Memory Deficit: No Emotional Barrier: No Cultural/Religious Beliefs Affecting Medical Care: No Physical Barrier Impaired Vision: No Impaired Hearing: No Decreased Hand dexterity: No Knowledge/Comprehension Knowledge Level: Medium Comprehension Level: Medium Ability to understand written instructions: Medium Ability to understand verbal instructions: Medium Motivation Anxiety Level: Calm Cooperation: Cooperative Education Importance: Acknowledges Need Interest in Health Problems: Asks Questions Perception: Coherent Willingness to Engage in Self-Management Medium Activities: Readiness to Engage in Self-Management Medium Activities: Electronic Signature(s) Signed: 05/04/2019 4:39:31 PM By: Curtis Sites Entered By: Curtis Sites on 05/04/2019 14:54:39 [name] [name] ([id number]) -------------------------------------------------------------------------------- Fall Risk Assessment Details Patient Name: [name] [name] Date of Service: 05/04/2019 2:15 PM Medical Record Number: [id number] Patient Account Number: [id number] Date of Birth/Sex:  1961-01-01 (59 y.o. M) Treating RN: Curtis Sites Primary Care Hurschel Paynter: Marvell Fuller Other Clinician: Referring Hardin Hardenbrook: Marvell Fuller Treating Patricia Perales/Extender: Linwood Dibbles, HOYT Weeks in Treatment: 0 Fall Risk Assessment Items Have you had 2 or more falls in the last 12 monthso 0 No Have you had any fall that resulted in injury in the last 12 monthso 0 No FALLS RISK SCREEN History of falling - immediate or within 3 months 25 Yes Secondary diagnosis (Do you have 2 or more medical diagnoseso) 0 No Ambulatory aid None/bed rest/wheelchair/nurse 0 Yes Crutches/cane/walker 0 No Furniture 0 No Intravenous therapy Access/Saline/Heparin Lock 0 No Gait/Transferring Normal/ bed rest/ wheelchair 0 No Weak (short steps with or without shuffle, stooped but able to  lift head while walking, may seek 10 Yes support from furniture) Impaired (short steps with shuffle, may have difficulty arising from chair, head down, impaired 20 Yes balance) Mental Status Oriented to own ability 0 Yes Electronic Signature(s) Signed: 05/04/2019 4:39:31 PM By: Montey Hora Entered By: Montey Hora on 05/04/2019 14:54:51 [name] [name] ([id number]) -------------------------------------------------------------------------------- Foot Assessment Details Patient Name: [name] [name] Date of Service: 05/04/2019 2:15 PM Medical Record Number: [id number] Patient Account Number: [id number] Date of Birth/Sex: 04/08/1960 (59 y.o. M) Treating RN: Montey Hora Primary Care Anyjah Roundtree: Laverna Peace Other Clinician: Referring Shavonna Corella: Laverna Peace Treating Ettie Krontz/Extender: STONE III, HOYT Weeks in Treatment: 0 Foot Assessment Items Site Locations + = Sensation present, - = Sensation absent, C = Callus, U = Ulcer R = Redness, W = Warmth, M = Maceration, PU = Pre-ulcerative lesion F = Fissure, S = Swelling, D = Dryness Assessment Right: Left: Other Deformity: No No Prior Foot Ulcer: No  No Prior Amputation: No No Charcot Joint: No No Ambulatory Status: Non-ambulatory Assistance Device: Wheelchair GaitEnergy manager) Signed: 05/04/2019 4:39:31 PM By: Montey Hora Entered By: Montey Hora on 05/04/2019 14:55:10 [name] [name] ([id number]) -------------------------------------------------------------------------------- Nutrition Risk Screening Details Patient Name: [name] [name] Date of Service: 05/04/2019 2:15 PM Medical Record Number: [id number] Patient Account Number: [id number] Date of Birth/Sex: 1960/08/30 (59 y.o. M) Treating RN: Montey Hora Primary Care Nikeya Maxim: Laverna Peace Other Clinician: Referring Amaira Safley: Laverna Peace Treating Levenia Skalicky/Extender: STONE III, HOYT Weeks in Treatment: 0 Height (in): 68 Weight (lbs): 400 Body Mass Index (BMI): 60.8 Nutrition Risk Screening Items Score Screening NUTRITION RISK SCREEN: I have an illness or condition that made me change the kind and/or amount of food I eat 0 No I eat fewer than two meals per day 0 No I eat few fruits and vegetables, or milk products 0 No I have three or more drinks of beer, liquor or wine almost every day 0 No I have tooth or mouth problems that make it hard for me to eat 0 No I don't always have enough money to buy the food I need 0 No I eat alone most of the time 0 No I take three or more different prescribed or over-the-counter drugs a day 1 Yes Without wanting to, I have lost or gained 10 pounds in the last six months 0 No I am not always physically able to shop, cook and/or feed myself 0 No Nutrition Protocols Good Risk Protocol 0 No interventions needed Moderate Risk Protocol High Risk Proctocol Risk Level: Good Risk Score: 1 Electronic Signature(s) Signed: 05/04/2019 4:39:31 PM By: Montey Hora Entered By: Montey Hora on 05/04/2019 14:54:57

## 2019-05-04 NOTE — Progress Notes (Signed)
Hemoglobin is improved from 2 months ago.  Glucose elevated at 115.  Hemogloobin A1C prediabetic. Monitor diet, will recheck. May need to follow up in near future to discuss monitoring and medication.  INR was sent to Dr. Okey Dupre - coumadin clinic will be calling him for dose adjustment as it is low,  Is he taking his Synthroid as directed daily ? if so I will need to adjust dose. It is improved from 2 months ago.  BNP ok.  Uric acid is slightly elevated. He is on warfarin, I recommend that we monitor this as medications will affect his coumadin levels and increase risk for bleeding. Keep cardiology appointment as well.  CT and MRI has been ordered of his head and referrals is trying to reach him. ER if any symptoms change or worsen at anytime.

## 2019-05-05 DIAGNOSIS — S069X9S Unspecified intracranial injury with loss of consciousness of unspecified duration, sequela: Secondary | ICD-10-CM | POA: Diagnosis not present

## 2019-05-07 ENCOUNTER — Ambulatory Visit: Admission: RE | Admit: 2019-05-07 | Payer: Medicaid Other | Source: Ambulatory Visit

## 2019-05-07 ENCOUNTER — Encounter: Payer: Self-pay | Admitting: *Deleted

## 2019-05-07 ENCOUNTER — Telehealth: Payer: Self-pay | Admitting: Adult Health

## 2019-05-07 ENCOUNTER — Ambulatory Visit: Payer: Self-pay | Admitting: *Deleted

## 2019-05-07 NOTE — Telephone Encounter (Signed)
Pt calling back to check if he would still be able to get home health services. Please advise.

## 2019-05-07 NOTE — Addendum Note (Signed)
Addended by: Berniece Pap on: 05/07/2019 03:40 PM   Modules accepted: Orders

## 2019-05-07 NOTE — Chronic Care Management (AMB) (Signed)
  Chronic Care Management   Note  05/07/2019 Name: Edwar Coe MRN: [id number] DOB: 23-Jan-1961  Hady Niemczyk is a 59 y.o. year old male who is a primary care patient of Flinchum, [name], FNP. Dreydon Cardenas is currently enrolled in care management services. An additional referral for RN case manager was placed.   Follow up plan: Telephone appointment with care management team member scheduled for:05/10/2019  Elisha Ponder, LPN Health Advisor, Embedded Care Coordination [facility] Health Care Management ??[email or web address] ??[phone]

## 2019-05-07 NOTE — Progress Notes (Signed)
Pt has appt w/ me on Wednesday to establish in the anti-coag clinic in the office since being d/c'd from [facility].

## 2019-05-07 NOTE — Progress Notes (Signed)
This encounter was created in error - please disregard.

## 2019-05-07 NOTE — Telephone Encounter (Signed)
Spoke with patient and Marvell Fuller in full depth, referral has been put out for social worker Marcelino Duster is aware of patients request for at home services. KW

## 2019-05-07 NOTE — Chronic Care Management (AMB) (Signed)
  Chronic Care Management   Note  05/07/2019 Name: [name] [name] MRN: [id number] DOB: 13-Jan-1961  [facility] referral noted.   Follow up plan: [facility] team will follow up with patient.    Marja Kays MHA,BSN,RN,CCM Lead Embedded Care Coordination Supervisor [facility] Care Management [phone]

## 2019-05-08 ENCOUNTER — Other Ambulatory Visit: Payer: Self-pay | Admitting: Adult Health

## 2019-05-08 ENCOUNTER — Encounter: Payer: Self-pay | Admitting: Adult Health

## 2019-05-08 ENCOUNTER — Ambulatory Visit: Payer: Self-pay | Admitting: *Deleted

## 2019-05-08 ENCOUNTER — Telehealth: Payer: Self-pay

## 2019-05-08 ENCOUNTER — Telehealth (HOSPITAL_COMMUNITY): Payer: Self-pay

## 2019-05-08 DIAGNOSIS — G510 Bell's palsy: Secondary | ICD-10-CM | POA: Insufficient documentation

## 2019-05-08 DIAGNOSIS — G894 Chronic pain syndrome: Secondary | ICD-10-CM

## 2019-05-08 MED ORDER — LISINOPRIL 2.5 MG PO TABS: 2.5000 mg | ORAL_TABLET | Freq: Every day | ORAL | 0 refills | Status: DC

## 2019-05-08 MED ORDER — TORSEMIDE 20 MG PO TABS: 60.0000 mg | ORAL_TABLET | Freq: Two times a day (BID) | ORAL | 0 refills | Status: DC

## 2019-05-08 MED ORDER — GABAPENTIN 300 MG PO CAPS: 300.0000 mg | ORAL_CAPSULE | Freq: Three times a day (TID) | ORAL | 0 refills | Status: DC

## 2019-05-08 MED ORDER — ALLOPURINOL 100 MG PO TABS: 100.0000 mg | ORAL_TABLET | Freq: Every day | ORAL | 0 refills | Status: DC

## 2019-05-08 MED ORDER — TRAZODONE HCL 150 MG PO TABS: ORAL_TABLET | ORAL | 0 refills | Status: DC

## 2019-05-08 MED ORDER — METOPROLOL SUCCINATE ER 50 MG PO TB24: 50.0000 mg | ORAL_TABLET | Freq: Two times a day (BID) | ORAL | 0 refills | Status: DC

## 2019-05-08 MED ORDER — LEVOTHYROXINE SODIUM 50 MCG PO TABS: 50.0000 ug | ORAL_TABLET | Freq: Every day | ORAL | 0 refills | Status: DC

## 2019-05-08 MED ORDER — DULOXETINE HCL 20 MG PO CPEP: 20.0000 mg | ORAL_CAPSULE | Freq: Every day | ORAL | 0 refills | Status: DC

## 2019-05-08 MED ORDER — FERROUS SULFATE 325 (65 FE) MG PO TABS: 325.0000 mg | ORAL_TABLET | Freq: Two times a day (BID) | ORAL | 0 refills | Status: DC

## 2019-05-08 NOTE — Telephone Encounter (Signed)
Please call Morrie Sheldon NP, Denyse Amass at [facility] to clarify.  Patient should follow up with neurology for these labs, he just had labs drawn here in clinic. I do not see labs ordered for neurology in the care everywhere system, it does look like he had an evaluation with neurology but I currently do not see a note- visit was 05/08/19. Getting him back into the office is the issue, he does have cardiology evaluation scheduled for 05/08/2019. Patient also missed MRI and CT that was ordered.

## 2019-05-08 NOTE — Progress Notes (Signed)
Meds ordered this encounter  Medications  . allopurinol (ZYLOPRIM) 100 MG tablet    Sig: Take 1 tablet (100 mg total) by mouth daily.    Dispense:  60 tablet    Refill:  0  . DULoxetine (CYMBALTA) 20 MG capsule    Sig: Take 1 capsule (20 mg total) by mouth daily.    Dispense:  90 capsule    Refill:  0  . ferrous sulfate 325 (65 FE) MG tablet    Sig: Take 1 tablet (325 mg total) by mouth 2 (two) times daily with a meal.    Dispense:  60 tablet    Refill:  0  . gabapentin (NEURONTIN) 300 MG capsule    Sig: Take 1 capsule (300 mg total) by mouth 3 (three) times daily.    Dispense:  270 capsule    Refill:  0  . levothyroxine (SYNTHROID) 50 MCG tablet    Sig: Take 1 tablet (50 mcg total) by mouth daily before breakfast. Need lab work beginning of June for Thyroid    Dispense:  90 tablet    Refill:  0  . lisinopril (ZESTRIL) 2.5 MG tablet    Sig: Take 1 tablet (2.5 mg total) by mouth daily.    Dispense:  90 tablet    Refill:  0  . metoprolol succinate (TOPROL-XL) 50 MG 24 hr tablet    Sig: Take 1 tablet (50 mg total) by mouth 2 (two) times daily. Take with or immediately following a meal.    Dispense:  90 tablet    Refill:  0  . traZODone (DESYREL) 150 MG tablet    Sig: TAKE 1 TABLET BY MOUTH AT BEDTIME AS NEEDED SLEEP    Dispense:  90 tablet    Refill:  0  . torsemide (DEMADEX) 20 MG tablet    Sig: Take 3 tablets (60 mg total) by mouth 2 (two) times daily.    Dispense:  90 tablet    Refill:  0  He will address Torsemide with cardiology follow up on 05/09/2019. I refilled 15 days of torsemide. BNP now normal last lab drawn. History of non compliance / poor historian withmedication.   Recheck labs in beginning of June

## 2019-05-08 NOTE — Telephone Encounter (Signed)
Copied from CRM [id number]. Topic: General - Inquiry >> May 08, 2019  3:39 PM Leary Roca wrote: Reason for CRM: Denyse Amass from [facility] calling to inform [name] [name] that pt was seen by [name] [name] today and wanted to rule out Myasthenia Gravis she would like these labs to be drawn if possible . And send the info back over to their office.   Call back [phone] Fax [phone]

## 2019-05-08 NOTE — Telephone Encounter (Signed)
Attempted to call, no answer.  Left message.  Will continue to try to reach him.   [name] [facility] EMT-Paramedic [phone]

## 2019-05-08 NOTE — Telephone Encounter (Signed)
Please review. KW 

## 2019-05-09 ENCOUNTER — Ambulatory Visit: Payer: Medicaid Other | Admitting: Internal Medicine

## 2019-05-09 ENCOUNTER — Telehealth: Payer: Self-pay | Admitting: Internal Medicine

## 2019-05-09 NOTE — Telephone Encounter (Signed)
I saw his INR reading of 1.8 on 05/02/19.  I had discussed w/ Dr. Okey Dupre that he was not in range, but still protected from stroke at 1.8; I would not call him to dose, as I was afraid that he would not show for his appt. I've only seen him once when he was here for another appt. He previously had his INRs checked weekly w/ HH, but his #s fluctuated so much that I wanted to keep a closer check on him.   The [facility] nurse at the time reported that he has has poor eating habits (rare Vit k intake) and consumes quite a bit of ETOH, so I do not feel comfortable letting him go very far b/t appts since his readings are all over the place. It is my understanding that he was discharged from [facility] due to noncompliance, but this might need to be something that needs to be readdressed since he is either unable to unwilling to get out to visits. Any recommendations are appreciated.  Thank you for your help!

## 2019-05-09 NOTE — Telephone Encounter (Signed)
Spoke w/ pt.  Pt was sched to see Dr. Okey Dupre today and have INR check, but he reports that his transportation did not show up. Advised him that I cannot send him in warfarin refill, as he is overdue for INR check. I could send in enough for him to see me next week, but will not send in a month's worth to get him to rescheduled appt on 06/06/19.  He states that his PCP sent him in refills, but they did not sent in enough to last a month. Offered to sched him INR check next week, but he declines unless he can see Dr. Okey Dupre at the same time. Advised pt that b/c his INR fluctuates so much, it is not safe for me to send in refill w/o monitoring his INR. He states that he will call his PCP for refills and "not worry about that appt" and then hung up on me.

## 2019-05-09 NOTE — Telephone Encounter (Signed)
Marchelle Folks, thanks for the update. I did refill his torsemide( I did 15 days of torsemide so he would have enough until he saw Dr. Okey Dupre today for further recommendations)   I did not refill warfarin at all,  as I was leaving that to Dr. Serita Kyle discretion. The INR in the chart last drawn I had forwarded to Dr. Okey Dupre who said coumadin clinic would manage. He has been unfortunately  non -compliant with medications and follow ups.

## 2019-05-09 NOTE — Progress Notes (Deleted)
Follow-up Outpatient Visit Date: 05/09/2019  Primary Care Provider: Doreen Beam, [place] [address]  Chief Complaint: ***  HPI:  [name] [name] is a 59 y.o. 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  He was last seen in our office on 04/02/2019 complaining of a 1 week history of facial droop.  It was felt this was most consistent with Bell's palsy.  He was started on a prednisone taper and advised to follow-up with his PCP.  He refused to go to the ED for further evaluation of possible stroke on multiple occasions.  --------------------------------------------------------------------------------------------------  Past Medical History:  Diagnosis Date  . Anxiety   . Arthritis   . Asthma   . Brain damage   . Chronic pain of both knees 07/13/2018  . Clotting disorder ([place])   . COPD (chronic obstructive pulmonary disease) ([place])   . Depression   . HFrEF (heart failure with reduced ejection fraction) ([place])    a. 03/2018 Echo: EF 25-30%, diff HK. Mod LAE.  [name] History of DVT (deep vein thrombosis)   . History of pulmonary embolism    a. Chronic coumadin.  [name] Hypertension   . MI (myocardial infarction) ([place])   . Morbid obesity ([place])   . Neck pain 07/13/2018  . NICM (nonischemic cardiomyopathy) ([place])    a. s/p Cath x 3 - reportedly nl cors. Last cath 2019 in [place]; b. a. 03/2018 Echo: EF 25-30%, diff HK.  [name] Persistent atrial fibrillation ([place])    a. 03/2018 s/p DCCV; b. CHA2DS2VASc = 1-->Xarelto (later changed to warfarin); c. 05/2018 recurrent afib-->Amio initiated.  . Sleep apnea    Past Surgical History:  Procedure Laterality Date  . CARDIOVERSION N/A 03/24/2018   Procedure: CARDIOVERSION;  Surgeon: Minna Merritts, MD;  Location: [facility] ORS;  Service: Cardiovascular;  Laterality: N/A;  . CARDIOVERSION N/A 08/08/2018   Procedure: CARDIOVERSION;  Surgeon: Nelva Bush, MD;  Location: [facility] ORS;  Service: Cardiovascular;  Laterality: N/A;  . hearnia repair     X 3- total of two surgeries  . HERNIA REPAIR    . LEG SURGERY       No outpatient medications have been marked as taking for the 05/09/19 encounter (Appointment) with Delicia Berens, Harrell Gave, MD.    Allergies: Patient has no known allergies.  Social History   Tobacco Use  . Smoking status: Never Smoker  . Smokeless tobacco: Never Used  Substance Use Topics  . Alcohol use: Not Currently  . Drug use: Never    Family History  Problem Relation Age of Onset  . Heart failure Mother   . Lung cancer Mother   . Lung cancer Father   . Heart attack Maternal Grandmother   . Heart attack Maternal Grandfather     Review of Systems: A 12-system review of systems was performed and was negative except as noted in the HPI.  --------------------------------------------------------------------------------------------------  Physical Exam: There were no vitals taken for this visit.  General:  *** HEENT: No conjunctival pallor or scleral icterus. Facemask in place. Neck: Supple without lymphadenopathy, thyromegaly, JVD, or HJR. Lungs: Normal work of breathing. Clear to auscultation bilaterally without wheezes or crackles. Heart: Regular rate and rhythm without murmurs, rubs, or gallops. Non-displaced PMI. Abd: Bowel sounds present. Soft, NT/ND without hepatosplenomegaly Ext: No lower  extremity edema. Radial, PT, and DP pulses are 2+ bilaterally. Skin: Warm and dry without rash.  EKG:  ***  Lab Results  Component Value Date   WBC 5.6 05/02/2019   HGB 12.7 (L) 05/02/2019   HCT 40.0 05/02/2019   MCV 86 05/02/2019   PLT 276 05/02/2019    Lab Results  Component Value Date   NA 141 05/02/2019   K 4.6 05/02/2019   CL 98 05/02/2019    CO2 28 05/02/2019   BUN 17 05/02/2019   CREATININE 1.13 05/02/2019   GLUCOSE 92 05/02/2019   ALT 11 05/02/2019    Lab Results  Component Value Date   CHOL 166 05/02/2019   HDL 70 05/02/2019   LDLCALC 75 05/02/2019   TRIG 122 05/02/2019   CHOLHDL 3.2 01/22/2019    --------------------------------------------------------------------------------------------------  ASSESSMENT AND PLAN: Cristal Deer Janara Klett, MD 05/09/2019 1:01 PM

## 2019-05-09 NOTE — Telephone Encounter (Signed)
Left message for Denyse Amass to call back. KW

## 2019-05-09 NOTE — Telephone Encounter (Signed)
Thank you for the update.  I agree with your concerns regarding compliance and safety of ongoing warfarin use.  Ms. [name] Norris, do you think it would be worthwhile to see if home health would be willing to work with him again?  It may be useful to have the paramedicine team see him as well.  Thayer Ohm

## 2019-05-09 NOTE — Chronic Care Management (AMB) (Signed)
Care Management    Clinical Social Work General Note  05/09/2019 Name: [name] [name] MRN: [id number] DOB: [birth year]  [name] [name] is a 59 y.o. year old male who is a primary care patient of Flinchum, [name], FNP. The CCM was consulted to assist the patient with [name] .   Review of patient status, including review of consultants reports, relevant laboratory and other test results, and collaboration with appropriate care team members and the patient's provider was performed as part of comprehensive patient evaluation and provision of chronic care management services.    SDOH (Social Determinants of Health) assessments and interventions performed:  Yes    Outpatient Encounter Medications as of 05/08/2019  Medication Sig  . albuterol (VENTOLIN HFA) 108 (90 Base) MCG/ACT inhaler Inhale 2 puffs into the lungs every 6 (six) hours as needed for wheezing or shortness of breath.  . allopurinol (ZYLOPRIM) 100 MG tablet Take 1 tablet (100 mg total) by mouth daily.  [name] atorvastatin (LIPITOR) 40 MG tablet TAKE 1 TABLET BY MOUTH ONCE DAILY  . DULoxetine (CYMBALTA) 20 MG capsule Take 1 capsule (20 mg total) by mouth daily.  . ferrous sulfate 325 (65 FE) MG tablet Take 1 tablet (325 mg total) by mouth 2 (two) times daily with a meal.  . gabapentin (NEURONTIN) 300 MG capsule Take 1 capsule (300 mg total) by mouth 3 (three) times daily.  [name] levothyroxine (SYNTHROID) 50 MCG tablet Take 1 tablet (50 mcg total) by mouth daily before breakfast. Need lab work beginning of June for Thyroid  . lisinopril (ZESTRIL) 2.5 MG tablet Take 1 tablet (2.5 mg total) by mouth daily.  . metoprolol succinate (TOPROL-XL) 50 MG 24 hr tablet Take 1 tablet (50 mg total) by mouth 2 (two) times daily. Take with or immediately following a meal.  . nitroGLYCERIN (NITROSTAT) 0.4 MG SL tablet Place 1 tablet under tongue every 5 minutes as needed for chest pain. (No more than 3 doses within 15 minutes)  . torsemide  (DEMADEX) 20 MG tablet Take 3 tablets (60 mg total) by mouth 2 (two) times daily.  . traZODone (DESYREL) 150 MG tablet TAKE 1 TABLET BY MOUTH AT BEDTIME AS NEEDED SLEEP  . warfarin (COUMADIN) 5 MG tablet Take by mouth daily as directed by the anti-coag clinic   No facility-administered encounter medications on file as of 05/08/2019.    Goals Addressed            This Visit's Progress   . "I was denied more aid service" (pt-stated)       CARE PLAN ENTRY (see longtitudinal plan of care for additional care plan information)  Current Barriers:  . ADL IADL limitations  Clinical Social Work Clinical Goal(s):  [name] Over the next 90 days, patient will work with [facility] to address needs related to be denied continued in home aid services  Interventions: . Patient interviewed and appropriate assessments performed . Confirmed that patient receives disability which takes care of his rent and utilities . Patient requesting assistance with getting a wheelchair lift for his car, however this may be an out of pocket expense . Patient confirmed that his sister in law helps with his laundry . Patient confirmed having no medication issues at this time as he now has his medications and receives them pill pack form through [facility] . Patient confirmed that he is being followed by community paramedics . Confirmed that patient was declined for personal care services  . Provided patient with information  about filing an appeal with [facility] regarding his denial for services-contact number provided [phone] . Discussed possible need of re-applying for personal care services if appeal does not go through . Discussed plans with patient for ongoing care management follow up and provided patient with direct contact information for care management team . Advised patient to call [facility]  Patient Self Care Activities:  . Patient verbalizes understanding of plan to contact  [name] to file an appeal for in home services . Unable to perform ADLs independently  Initial goal documentation         Follow Up Plan: SW will follow up with patient by phone over the next 14 business days        [place], [place] Worker  [place] Care Management [phone]

## 2019-05-09 NOTE — Telephone Encounter (Signed)
Dr. Okey Dupre,  I agree and can place another referral for home health, he actually had a terminated services withdrawn letter for appeal at his last visit.   Thanks for the help with this patient  Thank you,  Marvell Fuller MSN, AGNP-C, FNP-C  Family Nurse Practitioner  Adult Geriatric Nurse Practitioner

## 2019-05-09 NOTE — Telephone Encounter (Signed)
If Home Health RN is calling please get Coumadin Nurse on the phone STAT  1.  Are you calling in regards to an appointment? Yes transportation did not show up rescheduled in April   2.  Are you calling for a refill ? Yes not enough based on directions to get to next visit   3.  Are you having bleeding issues?  No   4.  Do you need clearance to hold Coumadin?  no   Please route to the Coumadin Clinic Pool

## 2019-05-09 NOTE — Patient Instructions (Signed)
Thank you allowing the Chronic Care Management Team to be a part of your care! It was a pleasure speaking with you today!  1. Please call [facility] [phone] to file an appeal for personal care service denial  CCM (Chronic Care Management) Team   Juanell Fairly RN, BSN Nurse Care Coordinator  [phone]  Pansey Pinheiro [address], LCSW Clinical Social Worker [phone]  Goals Addressed            This Visit's Progress   . "I was denied more aid service" (pt-stated)       CARE PLAN ENTRY (see longtitudinal plan of care for additional care plan information)  Current Barriers:  . ADL IADL limitations  Clinical Social Work Clinical Goal(s):  [name] Over the next 90 days, patient will work with [facility] to address needs related to be denied continued in home aid services  Interventions: . Patient interviewed and appropriate assessments performed . Confirmed that patient receives disability which takes care of his rent and utilities . Patient requesting assistance with getting a wheelchair lift for his car, however this may be an out of pocket expense . Patient confirmed that his sister in law helps with his laundry . Patient confirmed having no medication issues at this time as he now has his medications and receives them pill pack form through [facility] . Patient confirmed that he is being followed by community paramedics . Confirmed that patient was declined for personal care services  . Provided patient with information about filing an appeal with [facility] regarding his denial for services-contact number provided [phone] . Discussed possible need of re-applying for personal care services if appeal does not go through . Discussed plans with patient for ongoing care management follow up and provided patient with direct contact information for care management team . Advised patient to call [facility]  Patient Self Care Activities:   . Patient verbalizes understanding of plan to contact [employer] to file an appeal for in home services . Unable to perform ADLs independently  Initial goal documentation         The patient verbalized understanding of instructions provided today and declined a print copy of patient instruction materials.   The care management team will reach out to the patient again over the next 14 buisness days.

## 2019-05-10 ENCOUNTER — Ambulatory Visit: Payer: Medicaid Other

## 2019-05-10 DIAGNOSIS — R29810 Facial weakness: Secondary | ICD-10-CM

## 2019-05-10 DIAGNOSIS — S0990XA Unspecified injury of head, initial encounter: Secondary | ICD-10-CM

## 2019-05-10 DIAGNOSIS — R531 Weakness: Secondary | ICD-10-CM

## 2019-05-10 DIAGNOSIS — Z7901 Long term (current) use of anticoagulants: Secondary | ICD-10-CM

## 2019-05-10 DIAGNOSIS — I4891 Unspecified atrial fibrillation: Secondary | ICD-10-CM

## 2019-05-10 DIAGNOSIS — H539 Unspecified visual disturbance: Secondary | ICD-10-CM

## 2019-05-10 DIAGNOSIS — D649 Anemia, unspecified: Secondary | ICD-10-CM

## 2019-05-10 DIAGNOSIS — R4781 Slurred speech: Secondary | ICD-10-CM

## 2019-05-10 NOTE — Telephone Encounter (Signed)
Spoke with [name] [name] and she states that patient was adamant on having his labs drawn by our office. KW

## 2019-05-10 NOTE — Progress Notes (Signed)
Orders Placed This Encounter  Procedures  . Myasthenia Gravis Full Panel  . INR/PT  . B12 and Folate Panel   Ordered Myasthenia gravis panel for neurology as patient did not want to go to [facility] for labs. Will send result to neurology. He should still go for MRI/ CT of head as previously discussed.  B12 was not drawn at last visit.  INR/PT he missed at cardiology will redraw.

## 2019-05-11 ENCOUNTER — Encounter: Payer: Medicaid Other | Admitting: Physician Assistant

## 2019-05-11 ENCOUNTER — Other Ambulatory Visit: Payer: Self-pay

## 2019-05-14 ENCOUNTER — Telehealth (HOSPITAL_COMMUNITY): Payer: Self-pay

## 2019-05-14 NOTE — Telephone Encounter (Signed)
Attempted to contact, no answer.  Left message.  Will continue to try.   [name] EMT-Paramedic [phone]

## 2019-05-15 ENCOUNTER — Other Ambulatory Visit (INDEPENDENT_AMBULATORY_CARE_PROVIDER_SITE_OTHER): Payer: Self-pay | Admitting: Physician Assistant

## 2019-05-15 DIAGNOSIS — R531 Weakness: Secondary | ICD-10-CM | POA: Diagnosis not present

## 2019-05-15 DIAGNOSIS — L97812 Non-pressure chronic ulcer of other part of right lower leg with fat layer exposed: Secondary | ICD-10-CM

## 2019-05-15 DIAGNOSIS — D649 Anemia, unspecified: Secondary | ICD-10-CM | POA: Diagnosis not present

## 2019-05-15 DIAGNOSIS — R29810 Facial weakness: Secondary | ICD-10-CM | POA: Diagnosis not present

## 2019-05-15 DIAGNOSIS — I872 Venous insufficiency (chronic) (peripheral): Secondary | ICD-10-CM

## 2019-05-15 DIAGNOSIS — L97822 Non-pressure chronic ulcer of other part of left lower leg with fat layer exposed: Secondary | ICD-10-CM

## 2019-05-15 DIAGNOSIS — I4891 Unspecified atrial fibrillation: Secondary | ICD-10-CM | POA: Diagnosis not present

## 2019-05-15 DIAGNOSIS — Z7901 Long term (current) use of anticoagulants: Secondary | ICD-10-CM | POA: Diagnosis not present

## 2019-05-15 DIAGNOSIS — S0990XA Unspecified injury of head, initial encounter: Secondary | ICD-10-CM | POA: Diagnosis not present

## 2019-05-15 LAB — POCT INR: INR: 1.2 — AB (ref 2.0–3.0)

## 2019-05-16 ENCOUNTER — Ambulatory Visit (INDEPENDENT_AMBULATORY_CARE_PROVIDER_SITE_OTHER): Payer: Medicaid Other

## 2019-05-16 ENCOUNTER — Other Ambulatory Visit (INDEPENDENT_AMBULATORY_CARE_PROVIDER_SITE_OTHER): Payer: Self-pay | Admitting: Physician Assistant

## 2019-05-16 ENCOUNTER — Encounter (INDEPENDENT_AMBULATORY_CARE_PROVIDER_SITE_OTHER): Payer: Medicaid Other

## 2019-05-16 DIAGNOSIS — Z86718 Personal history of other venous thrombosis and embolism: Secondary | ICD-10-CM | POA: Diagnosis not present

## 2019-05-16 DIAGNOSIS — Z5181 Encounter for therapeutic drug level monitoring: Secondary | ICD-10-CM

## 2019-05-16 DIAGNOSIS — I872 Venous insufficiency (chronic) (peripheral): Secondary | ICD-10-CM

## 2019-05-16 DIAGNOSIS — Z7901 Long term (current) use of anticoagulants: Secondary | ICD-10-CM | POA: Diagnosis not present

## 2019-05-16 DIAGNOSIS — I2699 Other pulmonary embolism without acute cor pulmonale: Secondary | ICD-10-CM

## 2019-05-16 DIAGNOSIS — I482 Chronic atrial fibrillation, unspecified: Secondary | ICD-10-CM | POA: Diagnosis not present

## 2019-05-16 MED ORDER — WARFARIN SODIUM 5 MG PO TABS: ORAL_TABLET | ORAL | 0 refills | Status: DC

## 2019-05-16 NOTE — Patient Instructions (Signed)
Received INR from pt's PCP that was drawn in their office yesterday Left message on pt's vm that I am sending in refill of warfarin, for him to take 3 tablets (15 mg) of warfarin tonight & tomorrow, then on Friday resume warfarin 2 tablets every day.  -asked him to call the office to schedule next INR check in 2 weeks or call w/ results if he is able to get [facility] to come out

## 2019-05-17 DIAGNOSIS — G4733 Obstructive sleep apnea (adult) (pediatric): Secondary | ICD-10-CM | POA: Diagnosis not present

## 2019-05-18 DIAGNOSIS — G4733 Obstructive sleep apnea (adult) (pediatric): Secondary | ICD-10-CM | POA: Diagnosis not present

## 2019-05-21 ENCOUNTER — Encounter: Payer: Self-pay | Admitting: Physician Assistant

## 2019-05-21 ENCOUNTER — Telehealth: Payer: Self-pay

## 2019-05-21 ENCOUNTER — Ambulatory Visit: Payer: Self-pay

## 2019-05-21 ENCOUNTER — Ambulatory Visit (INDEPENDENT_AMBULATORY_CARE_PROVIDER_SITE_OTHER): Payer: Medicaid Other | Admitting: Physician Assistant

## 2019-05-21 DIAGNOSIS — M10041 Idiopathic gout, right hand: Secondary | ICD-10-CM

## 2019-05-21 MED ORDER — COLCHICINE 0.6 MG PO TABS: ORAL_TABLET | ORAL | 0 refills | Status: DC

## 2019-05-21 NOTE — Telephone Encounter (Signed)
Faxed copy of labs to [facility] at [phone].

## 2019-05-21 NOTE — Telephone Encounter (Signed)
Copied from CRM [id number]. Topic: General - Other >> May 21, 2019  1:03 PM Jaquita Rector A wrote: Reason for CRM: Kandee Keen with [facility] called to request copy of labs for Myasthenia Gravis panel faxed to them since it was not received Please fax to Fax# [phone]

## 2019-05-21 NOTE — Telephone Encounter (Signed)
Patien called stating that he has had a flare of gout.  He states it started Friday in his rt thumb.  Today it is his entire hand and he has swelling almost to his elbow. He states that it is red and warm to touch. He states he may have fever. He denies rash or open wound or bug bite. He has been using ASA every 3-4 hours for the pain. He rates pain@ 10.5. DT was completed. Virtual visit was scheduled. Care advice read to patient. He verbalized understanding.  Reason for Disposition . [1] Red area or streak AND [2] large (> 2 in. or 5 cm)  Answer Assessment - Initial Assessment Questions 1. ONSET: "When did the pain start?"     Started Friday afternoon in rt thumb 2. LOCATION: "Where is the pain located?"     Rt hand  swelling 3. PAIN: "How bad is the pain?" (Scale 1-10; or mild, moderate, severe)   - MILD (1-3): doesn't interfere with normal activities   - MODERATE (4-7): interferes with normal activities (e.g., work or school) or awakens from sleep   - SEVERE (8-10): excruciating pain, unable to do any normal activities, unable to hold a cup of water   10.5 4. WORK OR EXERCISE: "Has there been any recent work or exercise that involved this part of the body?"     No 5. CAUSE: "What do you think is causing the arm pain?"    gout 6. OTHER SYMPTOMS: "Do you have any other symptoms?" (e.g., neck pain, swelling, rash, fever, numbness, weakness)     Swelling in hand and arm up to elbow red warm 7. PREGNANCY: "Is there any chance you are pregnant?" "When was your last menstrual period?"     N/A  Protocols used: ARM PAIN-A-AH

## 2019-05-21 NOTE — Progress Notes (Signed)
Virtual telephone visit   Virtual Visit via Telephone Note   This visit type was conducted due to national recommendations for restrictions regarding the COVID-19 Pandemic (e.g. social distancing) in an effort to limit this patient's exposure and mitigate transmission in our community. Due to his co-morbid illnesses, this patient is at least at moderate risk for complications without adequate follow up. This format is felt to be most appropriate for this patient at this time. The patient did not have access to video technology or had technical difficulties with video requiring transitioning to audio format only (telephone). Physical exam was limited to content and character of the telephone converstion.    Patient location: Home Provider location: [facility]    Patient: [name] [name]   DOB: [birth year]   59 y.o. Male  MRN: [id number] Visit Date: 05/21/2019  Today's Provider: Mar Daring, PA-C  Subjective:    Chief Complaint  Patient presents with  . Hand Pain   Hand Pain  The incident occurred 5 to 7 days ago. There was no injury mechanism. The pain is present in the right hand. The quality of the pain is described as aching. The pain radiates to the right arm. The pain is mild. Associated symptoms include tingling. Associated symptoms comments: Itching, fever and swelling. He has tried ice for the symptoms. The treatment provided no relief.  Patient has history of gout.      Medications: Outpatient Medications Prior to Visit  Medication Sig  . albuterol (VENTOLIN HFA) 108 (90 Base) MCG/ACT inhaler Inhale 2 puffs into the lungs every 6 (six) hours as needed for wheezing or shortness of breath.  . allopurinol (ZYLOPRIM) 100 MG tablet Take 1 tablet (100 mg total) by mouth daily.  [name] atorvastatin (LIPITOR) 40 MG tablet TAKE 1 TABLET BY MOUTH ONCE DAILY  . DULoxetine (CYMBALTA) 20 MG capsule Take 1 capsule (20 mg total) by mouth daily.  . ferrous sulfate 325 (65 FE) MG tablet Take 1  tablet (325 mg total) by mouth 2 (two) times daily with a meal.  . gabapentin (NEURONTIN) 300 MG capsule Take 1 capsule (300 mg total) by mouth 3 (three) times daily.  [name] levothyroxine (SYNTHROID) 50 MCG tablet Take 1 tablet (50 mcg total) by mouth daily before breakfast. Need lab work beginning of June for Thyroid  . lisinopril (ZESTRIL) 2.5 MG tablet Take 1 tablet (2.5 mg total) by mouth daily.  . metoprolol succinate (TOPROL-XL) 50 MG 24 hr tablet Take 1 tablet (50 mg total) by mouth 2 (two) times daily. Take with or immediately following a meal.  . nitroGLYCERIN (NITROSTAT) 0.4 MG SL tablet Place 1 tablet under tongue every 5 minutes as needed for chest pain. (No more than 3 doses within 15 minutes)  . torsemide (DEMADEX) 20 MG tablet Take 3 tablets (60 mg total) by mouth 2 (two) times daily.  . traZODone (DESYREL) 150 MG tablet TAKE 1 TABLET BY MOUTH AT BEDTIME AS NEEDED SLEEP  . warfarin (COUMADIN) 5 MG tablet Take by mouth daily as directed by the anti-coag clinic   No facility-administered medications prior to visit.    Review of Systems  Constitutional: Negative.   Respiratory: Negative.   Cardiovascular: Negative.   Musculoskeletal: Positive for arthralgias and joint swelling.  Skin: Positive for color change.  Neurological: Positive for tingling.       Objective:    There were no vitals taken for this visit.      Assessment & Plan:    1.  Acute idiopathic gout of right hand Discussed possibly increasing allopurinol since still having gout flares. Wants to treat current flare first.  Will treat current flare with colchicine as below. Advised to call if not improving. - colchicine 0.6 MG tablet; Take 2 tabs PO initially then 1 tab PO q 12 hrs as needed until gout flare subsides  Dispense: 20 tablet; Refill: 0     I discussed the assessment and treatment plan with the patient. The patient was provided an opportunity to ask questions and all were answered. The patient agreed  with the plan and demonstrated an understanding of the instructions.   The patient was advised to call back or seek an in-person evaluation if the symptoms worsen or if the condition fails to improve as anticipated.  I provided 10 minutes of non-face-to-face time during this encounter.   Reine Just  [facility] [phone] (phone) [phone] (fax)  [facility]

## 2019-05-22 ENCOUNTER — Ambulatory Visit: Payer: Self-pay | Admitting: *Deleted

## 2019-05-22 ENCOUNTER — Telehealth: Payer: Self-pay | Admitting: *Deleted

## 2019-05-22 DIAGNOSIS — R531 Weakness: Secondary | ICD-10-CM

## 2019-05-22 DIAGNOSIS — G894 Chronic pain syndrome: Secondary | ICD-10-CM

## 2019-05-22 NOTE — Chronic Care Management (AMB) (Signed)
Care Management    Clinical Social Work Follow Up Note  05/22/2019 Name: [name] [name] MRN: [id number] DOB: 01-29-1961  [name] [name] is a 59 y.o. year old male who is a primary care patient of Flinchum, Kelby Aline, FNP. The CCM team was consulted for assistance with Mental Health Counseling and Resources.   Review of patient status, including review of consultants reports, other relevant assessments, and collaboration with appropriate care team members and the patient's provider was performed as part of comprehensive patient evaluation and provision of chronic care management services.    SDOH (Social Determinants of Health) assessments performed: No    Outpatient Encounter Medications as of 05/22/2019  Medication Sig  . albuterol (VENTOLIN HFA) 108 (90 Base) MCG/ACT inhaler Inhale 2 puffs into the lungs every 6 (six) hours as needed for wheezing or shortness of breath.  . allopurinol (ZYLOPRIM) 100 MG tablet Take 1 tablet (100 mg total) by mouth daily.  [name] atorvastatin (LIPITOR) 40 MG tablet TAKE 1 TABLET BY MOUTH ONCE DAILY  . colchicine 0.6 MG tablet Take 2 tabs PO initially then 1 tab PO q 12 hrs as needed until gout flare subsides  . DULoxetine (CYMBALTA) 20 MG capsule Take 1 capsule (20 mg total) by mouth daily.  . ferrous sulfate 325 (65 FE) MG tablet Take 1 tablet (325 mg total) by mouth 2 (two) times daily with a meal.  . gabapentin (NEURONTIN) 300 MG capsule Take 1 capsule (300 mg total) by mouth 3 (three) times daily.  [name] levothyroxine (SYNTHROID) 50 MCG tablet Take 1 tablet (50 mcg total) by mouth daily before breakfast. Need lab work beginning of June for Thyroid  . lisinopril (ZESTRIL) 2.5 MG tablet Take 1 tablet (2.5 mg total) by mouth daily.  . metoprolol succinate (TOPROL-XL) 50 MG 24 hr tablet Take 1 tablet (50 mg total) by mouth 2 (two) times daily. Take with or immediately following a meal.  . nitroGLYCERIN (NITROSTAT) 0.4 MG SL tablet Place 1 tablet under tongue every 5  minutes as needed for chest pain. (No more than 3 doses within 15 minutes)  . torsemide (DEMADEX) 20 MG tablet Take 3 tablets (60 mg total) by mouth 2 (two) times daily.  . traZODone (DESYREL) 150 MG tablet TAKE 1 TABLET BY MOUTH AT BEDTIME AS NEEDED SLEEP  . warfarin (COUMADIN) 5 MG tablet Take by mouth daily as directed by the anti-coag clinic   No facility-administered encounter medications on file as of 05/22/2019.     Goals Addressed            This Visit's Progress   . "I was denied more aid service" (pt-stated)       CARE PLAN ENTRY (see longtitudinal plan of care for additional care plan information)  Current Barriers:  . ADL IADL limitations  Clinical Social Work Clinical Goal(s):  [name] Over the next 90 days, patient will work with [facility] to address needs related to be denied continued in home aid services  Interventions: . Patient interviewed and appropriate assessments performed . Patient discussed having an episode of painful gout-PCP informed . Confirmed that patient's sister in law assisted patient in filing an appeal for personal care services-pending . Patient discussed that Sunday was the anniversary of wife's death . Emotional support provided, patient offered grief counseling resources however patient declined need . Patient continues to confirm having no medication issues at this time as he now has his medications and receives them pill pack form through [facility] .  Patient confirmed being followed by community paramedics, they have visited one time . Continued to discussed possible need to re-apply for personal care services if appeal does not go through . Discussed plans with patient for ongoing care management follow up and provided patient with direct contact information for care management team   Patient Self Care Activities:  . Patient verbalizes understanding of plan to contact [employer] to file an appeal for in home  services . Unable to perform ADLs independently  Please see past updates related to this goal by clicking on the "Past Updates" button in the selected goal          Follow Up Plan: SW will follow up with patient by phone over the next 2 weeks    [name] Parkey, LCSW Clinical Social Worker  [facility] Care Management [phone]

## 2019-05-22 NOTE — Patient Instructions (Addendum)
Thank you allowing the Chronic Care Management Team to be a part of your care! It was a pleasure speaking with you today!  1. Please follow up on  appeal for personal care services.  CCM (Chronic Care Management) Team   Heather Roberts  RN, BSN Nurse Care Coordinator  [phone]   [place], LCSW Clinical Social Worker [phone]  Goals Addressed            This Visit's Progress   . "I was denied more aid service" (pt-stated)       CARE PLAN ENTRY (see longtitudinal plan of care for additional care plan information)  Current Barriers:  . ADL IADL limitations  Clinical Social Work Clinical Goal(s):  [name] Over the next 90 days, patient will work with [facility] to address needs related to be denied continued in home aid services  Interventions: . Patient interviewed and appropriate assessments performed . Patient discussed having an episode of painful gout-PCP informed . Confirmed that patient's sister in law assisted patient in filing an appeal for personal care services-pending . Patient discussed that Sunday was the anniversary of wife's death . Emotional support provided, patient offered grief counseling resources however patient declined need . Patient continues to confirm having no medication issues at this time as he now has his medications and receives them pill pack form through [facility] . Patient confirmed being followed by community paramedics, they have visited one time . Continued to discussed possible need to re-apply for personal care services if appeal does not go through . Discussed plans with patient for ongoing care management follow up and provided patient with direct contact information for care management team   Patient Self Care Activities:  . Patient verbalizes understanding of plan to contact [employer] to file an appeal for in home services . Unable to perform ADLs independently  Please see past updates related to  this goal by clicking on the "Past Updates" button in the selected goal          The patient verbalized understanding of instructions provided today and declined a print copy of patient instruction materials.   Telephone follow up appointment with care management team member scheduled for:  06/04/19

## 2019-05-23 ENCOUNTER — Telehealth: Payer: Self-pay

## 2019-05-23 LAB — MYASTHENIA GRAVIS FULL PANEL
AChR Binding Ab, Serum: <0.03 nmol/L (ref 0.00–0.24)
Acetylchol Block Ab: 18 % (ref 0–25)
Acetylcholine Modulat Ab: <12 % (ref 0–20)
Anti-striation Abs: NEGATIVE

## 2019-05-23 LAB — B12 AND FOLATE PANEL
Folate: 10.2 ng/mL (ref >3.0)
Vitamin B-12: 850 pg/mL (ref 232–1245)

## 2019-05-23 LAB — PROTIME-INR
INR: 1.2 (ref 0.9–1.2)
Prothrombin Time: 13.3 s — ABNORMAL HIGH (ref 9.1–12.0)

## 2019-05-23 NOTE — Telephone Encounter (Signed)
Patient advised. He states he had to cancel CT/MRI appointment that was scheduled for tomorrow due to hand pain. He states the hand feels hot to the touch and he believes he may have a fever also. Please advise.

## 2019-05-23 NOTE — Telephone Encounter (Signed)
Copied from CRM [id number]. Topic: General - Inquiry >> May 23, 2019  9:32 AM Baldo Daub L wrote: Reason for CRM:   Pt states that gout medication was called in for him on Monday and he still feels no better.  Pt would like to know what to do.

## 2019-05-23 NOTE — Telephone Encounter (Signed)
Patient advised he verbalized understanding

## 2019-05-23 NOTE — Telephone Encounter (Signed)
-----   Message from Berniece Pap, FNP sent at 05/23/2019  9:40 AM EDT ----- Myasthenia Gravis panel is negative - was ordered by neurology at [facility] clinic. He needs to keep follow up with neurology and still go for CT / MRI that has been ordered.

## 2019-05-23 NOTE — Progress Notes (Signed)
Myasthenia Gravis panel is negative - was ordered by neurology at [facility] clinic. He needs to keep follow up with neurology and still go for CT / MRI that has been ordered.

## 2019-05-23 NOTE — Telephone Encounter (Signed)
Patient was seen by Marguerite Olea PA-C and treated for gout if this hand on 05/21/2019, as advised if symptoms are not improving or persisting over 5 days he should schedule a in person visit.  Tylenol per package for pain.   If urgent or emergent symptoms such as severe pain, increasing warmth or erythema or new or changing symptoms be seen immediately.   Should also be advised that the MRI/ and CT was orederd to rule out bleed from fall, and to rule out TIA/ Stroke given his severe symptoms and those symptoms not resolving. He had declined going to the ER when cardiology attempted to send and for PCP office as well. It is advised he keep this appointment for imaging as this is the second time he has canceled.

## 2019-05-24 ENCOUNTER — Ambulatory Visit: Payer: Medicaid Other

## 2019-05-24 ENCOUNTER — Telehealth: Payer: Self-pay

## 2019-05-24 ENCOUNTER — Ambulatory Visit: Admission: RE | Admit: 2019-05-24 | Payer: Medicaid Other | Source: Ambulatory Visit

## 2019-05-29 ENCOUNTER — Telehealth (HOSPITAL_COMMUNITY): Payer: Self-pay

## 2019-05-29 NOTE — Telephone Encounter (Signed)
Today had a telephone visit today.  He is complaining of having gout in his hand.  He has a telephone appt with his PCP in the morning.  He states swelling is going down but still hurts.  He is aware of cardiology appt this month, he states he will make the appt.  He did not show for CT scan that was ordered, he states had a fall hitting his head a month ago, but he is feeling fine since.  He states his hand he works his wheel chair is the one has gout in it and he can not steer it right now.  He has tried the tylenol and ice on it.  He denies any chest pain, shortness of breath.  He states keeps gaining weight but not fluid.(403 lbs) He is down 5 lbs since last visit.  He states he needs hip replacement and not active.  He states his home aid has stopped and would like them back.  He states having to go to wound clinic now for legs dressing and would prefer for them to come out to him.  His PCP is working on getting him a referral for them to come back.  Watches high sodium foods and drinks about 60 ounces a day.  Sometimes add milk and drinks a little more. He has all his medication except his warfarin, been out of for 2 weeks now, he states cardiologist discontinued it because his INR has not been checked.   Will continue to visit for heart failure.     [name] Ceiba EMT-Paramedic [phone]

## 2019-05-30 ENCOUNTER — Telehealth: Payer: Self-pay

## 2019-05-30 ENCOUNTER — Telehealth: Payer: Self-pay | Admitting: Adult Health

## 2019-05-30 ENCOUNTER — Ambulatory Visit: Payer: Self-pay | Admitting: Adult Health

## 2019-05-30 ENCOUNTER — Other Ambulatory Visit: Payer: Self-pay | Admitting: Adult Health

## 2019-05-30 DIAGNOSIS — M1611 Unilateral primary osteoarthritis, right hip: Secondary | ICD-10-CM

## 2019-05-30 DIAGNOSIS — M25551 Pain in right hip: Secondary | ICD-10-CM

## 2019-05-30 DIAGNOSIS — Z7901 Long term (current) use of anticoagulants: Secondary | ICD-10-CM

## 2019-05-30 DIAGNOSIS — D649 Anemia, unspecified: Secondary | ICD-10-CM

## 2019-05-30 DIAGNOSIS — I482 Chronic atrial fibrillation, unspecified: Secondary | ICD-10-CM

## 2019-05-30 DIAGNOSIS — W19XXXA Unspecified fall, initial encounter: Secondary | ICD-10-CM

## 2019-05-30 DIAGNOSIS — G894 Chronic pain syndrome: Secondary | ICD-10-CM

## 2019-05-30 DIAGNOSIS — E662 Morbid (severe) obesity with alveolar hypoventilation: Secondary | ICD-10-CM

## 2019-05-30 DIAGNOSIS — Z9114 Patient's other noncompliance with medication regimen: Secondary | ICD-10-CM

## 2019-05-30 DIAGNOSIS — I5022 Chronic systolic (congestive) heart failure: Secondary | ICD-10-CM

## 2019-05-30 DIAGNOSIS — S0990XA Unspecified injury of head, initial encounter: Secondary | ICD-10-CM

## 2019-05-30 DIAGNOSIS — M10041 Idiopathic gout, right hand: Secondary | ICD-10-CM

## 2019-05-30 DIAGNOSIS — R29810 Facial weakness: Secondary | ICD-10-CM

## 2019-05-30 DIAGNOSIS — R531 Weakness: Secondary | ICD-10-CM

## 2019-05-30 DIAGNOSIS — J9601 Acute respiratory failure with hypoxia: Secondary | ICD-10-CM

## 2019-05-30 DIAGNOSIS — R4781 Slurred speech: Secondary | ICD-10-CM

## 2019-05-30 DIAGNOSIS — H539 Unspecified visual disturbance: Secondary | ICD-10-CM

## 2019-05-30 DIAGNOSIS — I4891 Unspecified atrial fibrillation: Secondary | ICD-10-CM

## 2019-05-30 DIAGNOSIS — J410 Simple chronic bronchitis: Secondary | ICD-10-CM

## 2019-05-30 MED ORDER — COLCHICINE 0.6 MG PO TABS: ORAL_TABLET | ORAL | 0 refills | Status: DC

## 2019-05-30 NOTE — Telephone Encounter (Signed)
Referral placed. Last office visit was January since he has not returned for scheduled follow ups and as advised he may need another face to face visit for them to approve.  Since he does not have transportation will refill medication once and he must schedule a follow up as he is long over due as he has been advised multiple times.    [facility] [address], [place]  [phone]  Open 24 hours

## 2019-05-30 NOTE — Progress Notes (Signed)
Meds ordered this encounter  Medications  . colchicine 0.6 MG tablet    Sig: 1 tab PO q 12 hrs as needed until gout flare subsides, schedule on office follow up    Dispense:  20 tablet    Refill:  0   Patient would not come to office for in person evaluation. He needs home health. In denial process with Medicaid appeal.    Orders Placed This Encounter  Procedures  . Ambulatory referral to Home Health    Referral Priority:   Urgent    Referral Type:   Home Health Care    Referral Reason:   Specialty Services Required    Requested Specialty:   Home Health Services    Number of Visits Requested:   1

## 2019-05-30 NOTE — Telephone Encounter (Signed)
Patient returned my call and requested a refill on Colchicine for gout flare up he says is in his hands. Patient is also requesting we put in referral or Home health care through [facility], since his appeal will take longer to get response back from. KW

## 2019-05-30 NOTE — Telephone Encounter (Signed)
[id number] 

## 2019-05-30 NOTE — Telephone Encounter (Signed)
Copied from CRM [id number]. Topic: General - Inquiry >> May 30, 2019  1:13 PM Leary Roca wrote: Reason for CRM: Pt is needing a call from office . Please advise

## 2019-05-30 NOTE — Telephone Encounter (Signed)
[facility] will not accept pt due to non compliance. Also can you put disciplines in referral as to what pt is needing.

## 2019-05-31 ENCOUNTER — Other Ambulatory Visit: Payer: Self-pay

## 2019-05-31 ENCOUNTER — Telehealth: Payer: Self-pay | Admitting: Internal Medicine

## 2019-05-31 MED ORDER — WARFARIN SODIUM 5 MG PO TABS: ORAL_TABLET | ORAL | 0 refills | Status: DC

## 2019-05-31 NOTE — Telephone Encounter (Signed)
Ok which agency and we will notify patient of agency and start date.  Many thanks !   Thank you,  Marvell Fuller MSN, AGNP-C, FNP-C  Family Nurse Practitioner  Adult Geriatric Nurse Practitioner

## 2019-05-31 NOTE — Telephone Encounter (Signed)
Prescription refill sent. Called pt and LMOM to clarify with  pt what dose of warfarin he is taking. Per last anti-coag encounter pt was instructed to take 3 tablets (15 mg) for 2 days and then to continue to take 2 tablets daily (10mg ). Pt has an appointment to get his INR checked on 06/06/2019.

## 2019-05-31 NOTE — Telephone Encounter (Signed)
*  STAT* If patient is at the pharmacy, call can be transferred to refill team.   1. Which medications need to be refilled? (please list name of each medication and dose if known) warfarin 5 mg (3xday/2 days of the week) 2xday/5 days of week   2. Which pharmacy/location (including street and city if local pharmacy) is medication to be sent to? [facility] in [place]  3. Do they need a 30 day or 90 day supply? 30 day (70 tablets)

## 2019-05-31 NOTE — Telephone Encounter (Signed)
Patient states he received a call from the coumadin clinic about his warfarin. I did not see any notes about anyone calling him.

## 2019-05-31 NOTE — Telephone Encounter (Signed)
Called the pt and lmomed him stating to call us back

## 2019-05-31 NOTE — Telephone Encounter (Signed)
I got another home health agency but they are unable to start services until 06/05/19

## 2019-05-31 NOTE — Addendum Note (Signed)
Addended by: Berniece Pap on: 05/31/2019 10:38 AM   Modules accepted: Orders

## 2019-05-31 NOTE — Telephone Encounter (Signed)
Updated orders for disciplines. [facility] or other home health accepting is ok. He desperately needs evaluation by nursing and Physical therapy. Non compliance is related to medication adherence/ needing help with medications and his reported issues with missing appointments due to reported having hard time leaving his home.

## 2019-06-01 ENCOUNTER — Ambulatory Visit: Payer: Self-pay | Admitting: *Deleted

## 2019-06-01 ENCOUNTER — Other Ambulatory Visit
Admission: RE | Admit: 2019-06-01 | Discharge: 2019-06-01 | Disposition: A | Discharge status: Home / Self Care | Payer: Medicaid Other | Source: Ambulatory Visit | Attending: Physician Assistant | Admitting: Physician Assistant

## 2019-06-01 ENCOUNTER — Encounter: Payer: Medicaid Other | Attending: Physician Assistant | Admitting: Physician Assistant

## 2019-06-01 ENCOUNTER — Other Ambulatory Visit: Payer: Self-pay

## 2019-06-01 DIAGNOSIS — L97812 Non-pressure chronic ulcer of other part of right lower leg with fat layer exposed: Secondary | ICD-10-CM | POA: Insufficient documentation

## 2019-06-01 DIAGNOSIS — I890 Lymphedema, not elsewhere classified: Secondary | ICD-10-CM | POA: Diagnosis not present

## 2019-06-01 DIAGNOSIS — I5042 Chronic combined systolic (congestive) and diastolic (congestive) heart failure: Secondary | ICD-10-CM | POA: Insufficient documentation

## 2019-06-01 DIAGNOSIS — I509 Heart failure, unspecified: Secondary | ICD-10-CM | POA: Diagnosis not present

## 2019-06-01 DIAGNOSIS — L97822 Non-pressure chronic ulcer of other part of left lower leg with fat layer exposed: Secondary | ICD-10-CM | POA: Diagnosis not present

## 2019-06-01 DIAGNOSIS — E11622 Type 2 diabetes mellitus with other skin ulcer: Secondary | ICD-10-CM | POA: Diagnosis not present

## 2019-06-01 DIAGNOSIS — J449 Chronic obstructive pulmonary disease, unspecified: Secondary | ICD-10-CM | POA: Insufficient documentation

## 2019-06-01 DIAGNOSIS — I10 Essential (primary) hypertension: Secondary | ICD-10-CM | POA: Insufficient documentation

## 2019-06-01 DIAGNOSIS — B999 Unspecified infectious disease: Secondary | ICD-10-CM | POA: Insufficient documentation

## 2019-06-01 DIAGNOSIS — Z86711 Personal history of pulmonary embolism: Secondary | ICD-10-CM | POA: Insufficient documentation

## 2019-06-01 DIAGNOSIS — I872 Venous insufficiency (chronic) (peripheral): Secondary | ICD-10-CM | POA: Diagnosis not present

## 2019-06-01 DIAGNOSIS — I110 Hypertensive heart disease with heart failure: Secondary | ICD-10-CM | POA: Insufficient documentation

## 2019-06-01 DIAGNOSIS — G894 Chronic pain syndrome: Secondary | ICD-10-CM

## 2019-06-01 DIAGNOSIS — Z7901 Long term (current) use of anticoagulants: Secondary | ICD-10-CM | POA: Insufficient documentation

## 2019-06-01 DIAGNOSIS — I480 Paroxysmal atrial fibrillation: Secondary | ICD-10-CM | POA: Diagnosis not present

## 2019-06-01 DIAGNOSIS — M10041 Idiopathic gout, right hand: Secondary | ICD-10-CM

## 2019-06-01 DIAGNOSIS — L97819 Non-pressure chronic ulcer of other part of right lower leg with unspecified severity: Secondary | ICD-10-CM | POA: Diagnosis present

## 2019-06-01 DIAGNOSIS — Z6841 Body Mass Index (BMI) 40.0 and over, adult: Secondary | ICD-10-CM | POA: Diagnosis not present

## 2019-06-01 DIAGNOSIS — I739 Peripheral vascular disease, unspecified: Secondary | ICD-10-CM | POA: Diagnosis not present

## 2019-06-01 NOTE — Patient Instructions (Signed)
Thank you allowing the Chronic Care Management Team to be a part of your care! It was a pleasure speaking with you today!  1. Please call this social worker with any questions or concerns regarding your community resource needs.  CCM (Chronic Care Management) Team   Juanell Fairly RN, BSN Nurse Care Coordinator  [phone]  [place] [facility], LCSW Clinical Social Worker [phone]  Goals Addressed            This Visit's Progress   . "I was denied more aid service, I need a aid (pt-stated)       CARE PLAN ENTRY (see longtitudinal plan of care for additional care plan information)  Current Barriers:  . ADL IADL limitations  Clinical Social Work Clinical Goal(s):  [name] Over the next 90 days, patient will work with [facility] to address needs related to be denied continued in home aid services  Interventions: . Follow up phone call regarding re-evaluation for personal care services . Patient confirmed that a nurse will come from [facility] to re-evaluate him next week . Patient discussed concern about the wounds on his legs and being placed on an antibiotic . Patient discussed increased need for an aid at this time and his hopeful that he is approved . Patient continues to confirm having no medication issues at this time as he now has his medications and receives them pill pack form through [facility] .  [facility] to request any possible assistance in getting a wheelchair lift for his van-spoke with Blake Divine [phone] . Discussed plans with patient for ongoing care management follow up and provided patient with direct contact information for care management team   Patient Self Care Activities:  . Patient verbalizes understanding of plan to contact [employer] to file an appeal for in home services . Unable to perform ADLs independently  Please see past updates related to this goal by clicking on the "Past Updates"  button in the selected goal          The patient verbalized understanding of instructions provided today and declined a print copy of patient instruction materials.   Telephone follow up appointment with care management team member scheduled for: 06/15/19

## 2019-06-01 NOTE — Progress Notes (Addendum)
[name] [name], [name] [name] ([id number]) Visit Report for 06/01/2019 Chief Complaint Document Details Patient Name: [name] [name], [name] [name] Date of Service: 06/01/2019 9:15 AM Medical Record Number: [id number] Patient Account Number: [id number] Date of Birth/Sex: 1960/03/11 (59 y.o. M) Treating RN: Montey Hora Primary Care Provider: Laverna Peace Other Clinician: Referring Provider: Laverna Peace Treating Provider/Extender: Melburn Hake, [name] [name] Weeks in Treatment: 4 Information Obtained from: Patient Chief Complaint Bilateral LE ulcers Electronic Signature(s) Signed: 06/01/2019 9:34:44 AM By: Worthy Keeler PA-C Entered By: Worthy Keeler on 06/01/2019 09:34:43 [name] [name] ([id number]) -------------------------------------------------------------------------------- HPI Details Patient Name: [name] [name] Date of Service: 06/01/2019 9:15 AM Medical Record Number: [id number] Patient Account Number: [id number] Date of Birth/Sex: [birth year] (59 y.o. M) Treating RN: Montey Hora Primary Care Provider: Laverna Peace Other Clinician: Referring Provider: Laverna Peace Treating Provider/Extender: Melburn Hake, [name] [name] Weeks in Treatment: 4 History of Present Illness HPI Description: 01/18/2019 on evaluation today patient presents for initial evaluation here in our clinic concerning issues he is been having with his bilateral lower extremities. He appears based on what I am seeing to have bilateral lower extremity cellulitis along with chronic venous stasis. He has significant pitting edema even in the bottom of his foot. With that being said he also has a history of hypertension, COPD, congestive heart failure, long-term use of anticoagulants due to atrial fibrillation which is paroxysmal as well as having had a pulmonary embolism in the past. With that being said he tells me that he has constant drainage out of his left lower extremity. This is likely due to the fact that again he has  significant swelling pretty much all the time. With that being said in the past 2 weeks this has been getting worse and he tells me at this point that he feels like it is infected is also hurting a lot more than it has been in the past and the drainage is a different color. The left side is definitely worse than the right. Fortunately there is no signs of active infection systemically although I am concerned due to the severity of the infection that I see in the left lower extremity especially. There is a lot of necrotic tissue on the surface of both wounds that at some point is going to need to be cleaned off to allow these to heal but right now obviously this is much too tender for him. No fevers, chills, nausea, vomiting, or diarrhea. 02/02/2019 on evaluation today patient presents for follow-up concerning the wounds over his bilateral lower extremities. He is continuing to manage. He was in the hospital and has been discharged at this point. He was admitted from 01/21/2019 through 01/27/2019. This was due to cellulitis of lower extremity. Nonetheless he does seem to be doing much better at this point. I did review his discharge summary as well he did not appear to have any positive findings on blood cultures. His white blood cell count was never really elevated either which is also good news. He was treated with IV clindamycin for 4 days and then switch to complete a 10-day course at home which she is still working on at this point. They did have him on IV Lasix in the hospital which helped him diurese well on discharge they stated that his weight was 408 pounds which is still higher than his dry weight of 375 to 380 pounds. Upon discharge his torsemide was increased from 40 to 60 mg twice daily and he is to follow-up with cardiology as well. 12/29-Patient returns at 2 weeks  for evaluation and follow-up of his bilateral lower extremity wounds, with lymphedema, venous ulcerations, patient was  recently hospitalized and was aggressively diuresed after which his torsemide has been increased. He follows up with cardiology as well. His left leg stasis wounds are more moist and have maceration of tissue, the right one is less so. We are using silver cell and unna compression given the degree of edema in the legs. Readmission: 05/04/2019 patient presents today for reevaluation here in the clinic I have not seen him since December 2020. Subsequently this is a fairly sick individual with multiple comorbidities who presents for follow-up concerning wounds of his bilateral lower extremities. He tells me that after he was in the hospital following when we saw him last in December that he ended up subsequently being discharged with home health services and they have been coming out and applying Unna boot wraps. With that being said he has used up all of his home health visits for the year and then for the last week has not had anything on his legs as far as the wrap is concerned. Obviously this is not good and he does come in today with increased swelling or at least what I perceived to be along with somewhat dry wounds again he does not seem to be weeping too much at this point which is at least good news but he has significant lymphedema/venous stasis. No fevers, chills, nausea, vomiting, or diarrhea. 06/01/2019 upon evaluation today patient is seen after not having been seen actually since 19 2021. He kept coming for appointment with every time he would come his temperature would be elevated past the point where we could see him. I told him that he needed to go be seen at the ER during 1 of these times due to the fact that obviously this was a fever of unknown origin at that point and my concern was that it could potentially be his legs but again I was not entirely sure as obviously I could not see them due to the restrictions secondary to Covid even bringing the patient into the clinic. Nonetheless  he finally came in today his temperature again was elevated but not to the same degree that it met the 100.4 or above guidelines of having to cancel the appointment. Either way we did have a look at his legs unfortunately he appears to show signs of cellulitis at this point which is probably where the temperature elevations are emanating from. He has significant lymphedema as well as chronic vent venous stasis as well. When I initially told him that is why I told him to go to the ER previously when his temperature was elevated in case it was something with wounds or otherwise he replied "nobody ever told me to go to the ER". I then subsequently reminded him of me coming to the front desk due to his temperature being elevated and advising him to go to the ER to which she replied "I am not going to the ER and I do not think I need to even with this". Nonetheless I am concerned about the infection of his legs at this point he has significant weeping, significant edema, and overall is not doing very well in my opinion. Electronic Signature(s) Signed: 06/01/2019 10:59:47 AM By: Worthy Keeler PA-C Entered By: Worthy Keeler on 06/01/2019 10:59:47 [name] [name], [name] [name] ([id number]) -------------------------------------------------------------------------------- Physical Exam Details Patient Name: [name] [name] Date of Service: 06/01/2019 9:15 AM Medical Record Number: [id number] Patient Account Number: [id number]  Date of Birth/Sex: 1961/01/18 (59 y.o. M) Treating RN: Montey Hora Primary Care Provider: Laverna Peace Other Clinician: Referring Provider: Laverna Peace Treating Provider/Extender: [name] [name], [name] [name] Weeks in Treatment: 4 Constitutional Well-nourished and well-hydrated in no acute distress. Respiratory normal breathing without difficulty. Psychiatric this patient is able to make decisions and demonstrates good insight into disease process. Alert and Oriented x 3. pleasant and  cooperative. Notes Upon inspection patient had erythema and warmth to both lower extremities. We did actually culture the left lower extremity today in order to evaluate for what bacteria could potentially be the causative organism. With that being said the patient absolutely refused to go to the ER which is the reason that I did perform the culture and subsequently prescribed antibiotics for him. It was not my intention nor do I think it is the best course of action but again otherwise the patient was good to leave with no intervention and so out of a sure desire to try to see him improved in some way I did prescribe this. Nonetheless I think that if anything changes or he feels bad in any way shape or form he should go to the ER ASAP. This was discussed in great detail with the patient today Di Kindle was present during the evaluation and the entire conversation. Electronic Signature(s) Signed: 06/01/2019 11:00:44 AM By: Worthy Keeler PA-C Entered By: Worthy Keeler on 06/01/2019 11:00:44 [name] [name] ([id number]) -------------------------------------------------------------------------------- Physician Orders Details Patient Name: [name] [name] Date of Service: 06/01/2019 9:15 AM Medical Record Number: [id number] Patient Account Number: [id number] Date of Birth/Sex: 1961-01-10 (59 y.o. M) Treating RN: Montey Hora Primary Care Provider: Laverna Peace Other Clinician: Referring Provider: Laverna Peace Treating Provider/Extender: Melburn Hake, [name] [name] Weeks in Treatment: 4 Verbal / Phone Orders: No Diagnosis Coding ICD-10 Coding Code Description I87.2 Venous insufficiency (chronic) (peripheral) I89.0 Lymphedema, not elsewhere classified L97.812 Non-pressure chronic ulcer of other part of right lower leg with fat layer exposed L97.822 Non-pressure chronic ulcer of other part of left lower leg with fat layer exposed [place] (primary) hypertension J44.9 Chronic obstructive  pulmonary disease, unspecified Z79.01 Long term (current) use of anticoagulants I48.0 Paroxysmal atrial fibrillation Z86.711 Personal history of pulmonary embolism I50.42 Chronic combined systolic (congestive) and diastolic (congestive) heart failure Wound Cleansing Wound #3 Right,Proximal,Lateral Lower Leg o Cleanse wound with mild soap and water o May shower with protection. Wound #5 Left,Lateral Lower Leg o Cleanse wound with mild soap and water o May shower with protection. Wound #6 Left,Posterior Lower Leg o Cleanse wound with mild soap and water o May shower with protection. Secondary Dressing Wound #3 Right,Proximal,Lateral Lower Leg o ABD and Kerlix/Conform - secure with ace wrap Wound #5 Left,Lateral Lower Leg o ABD and Kerlix/Conform - secure with ace wrap Wound #6 Left,Posterior Lower Leg o ABD and Kerlix/Conform - secure with ace wrap Dressing Change Frequency Wound #3 Right,Proximal,Lateral Lower Leg o Change dressing every other day. Wound #5 Left,Lateral Lower Leg o Change dressing every other day. Wound #6 Left,Posterior Lower Leg o Change dressing every other day. Follow-up Appointments o Return Appointment in 1 week. Edema Control [name] [name], [name] [name] ([id number]) Wound #3 Right,Proximal,Lateral Lower Leg o Elevate legs to the level of the heart and pump ankles as often as possible o Other: - ace wraps Wound #5 Left,Lateral Lower Leg o Elevate legs to the level of the heart and pump ankles as often as possible o Other: - ace wraps Wound #6 Left,Posterior Lower Leg o Elevate legs to the level of  the heart and pump ankles as often as possible o Other: - ace wraps Laboratory o Bacteria identified in Wound by Culture (MICRO) oooo LOINC Code: 7425-9 oooo Convenience Name: Wound culture routine Patient Medications Allergies: No Known Drug Allergies Notifications Medication Indication Start End doxycycline hyclate  06/01/2019 DOSE 1 - oral 100 mg capsule - 1 capsule oral taken 2 times per day for 10 days. Do not take iron with this medication. Please advise your coumadin clinic of taking this. Notes Patient is advised to go to the ED today Electronic Signature(s) Signed: 06/01/2019 11:06:35 AM By: Worthy Keeler PA-C Entered By: Worthy Keeler on 06/01/2019 11:06:34 [name] [name] ([id number]) -------------------------------------------------------------------------------- Problem List Details Patient Name: [name] [name] Date of Service: 06/01/2019 9:15 AM Medical Record Number: [id number] Patient Account Number: [id number] Date of Birth/Sex: 02/07/1961 (59 y.o. M) Treating RN: Montey Hora Primary Care Provider: Laverna Peace Other Clinician: Referring Provider: Laverna Peace Treating Provider/Extender: Melburn Hake, [name] [name] Weeks in Treatment: 4 Active Problems ICD-10 Evaluated Encounter Code Description Active Date Today Diagnosis I87.2 Venous insufficiency (chronic) (peripheral) 05/04/2019 No Yes I89.0 Lymphedema, not elsewhere classified 05/04/2019 No Yes L97.812 Non-pressure chronic ulcer of other part of right lower leg with fat layer 05/04/2019 No Yes exposed L97.822 Non-pressure chronic ulcer of other part of left lower leg with fat layer 05/04/2019 No Yes exposed [place] (primary) hypertension 05/04/2019 No Yes J44.9 Chronic obstructive pulmonary disease, unspecified 05/04/2019 No Yes Z79.01 Long term (current) use of anticoagulants 05/04/2019 No Yes I48.0 Paroxysmal atrial fibrillation 05/04/2019 No Yes Z86.711 Personal history of pulmonary embolism 05/04/2019 No Yes I50.42 Chronic combined systolic (congestive) and diastolic (congestive) heart 05/04/2019 No Yes failure Inactive Problems Resolved Problems [name] [name], [name] [name] ([id number]) Electronic Signature(s) Signed: 06/01/2019 9:34:36 AM By: Worthy Keeler PA-C Entered By: Worthy Keeler on 06/01/2019 09:34:35 [name] [name] ([id number]) -------------------------------------------------------------------------------- Progress Note Details Patient Name: [name] [name] Date of Service: 06/01/2019 9:15 AM Medical Record Number: [id number] Patient Account Number: [id number] Date of Birth/Sex: 30-Oct-1960 (59 y.o. M) Treating RN: Montey Hora Primary Care Provider: Laverna Peace Other Clinician: Referring Provider: Laverna Peace Treating Provider/Extender: Melburn Hake, [name] [name] Weeks in Treatment: 4 Subjective Chief Complaint Information obtained from Patient Bilateral LE ulcers History of Present Illness (HPI) 01/18/2019 on evaluation today patient presents for initial evaluation here in our clinic concerning issues he is been having with his bilateral lower extremities. He appears based on what I am seeing to have bilateral lower extremity cellulitis along with chronic venous stasis. He has significant pitting edema even in the bottom of his foot. With that being said he also has a history of hypertension, COPD, congestive heart failure, long-term use of anticoagulants due to atrial fibrillation which is paroxysmal as well as having had a pulmonary embolism in the past. With that being said he tells me that he has constant drainage out of his left lower extremity. This is likely due to the fact that again he has significant swelling pretty much all the time. With that being said in the past 2 weeks this has been getting worse and he tells me at this point that he feels like it is infected is also hurting a lot more than it has been in the past and the drainage is a different color. The left side is definitely worse than the right. Fortunately there is no signs of active infection systemically although I am concerned due to the severity of the infection that I see in the left lower extremity especially. There is  a lot of necrotic tissue on the surface of both wounds that at some point is going to need to  be cleaned off to allow these to heal but right now obviously this is much too tender for him. No fevers, chills, nausea, vomiting, or diarrhea. 02/02/2019 on evaluation today patient presents for follow-up concerning the wounds over his bilateral lower extremities. He is continuing to manage. He was in the hospital and has been discharged at this point. He was admitted from 01/21/2019 through 01/27/2019. This was due to cellulitis of lower extremity. Nonetheless he does seem to be doing much better at this point. I did review his discharge summary as well he did not appear to have any positive findings on blood cultures. His white blood cell count was never really elevated either which is also good news. He was treated with IV clindamycin for 4 days and then switch to complete a 10-day course at home which she is still working on at this point. They did have him on IV Lasix in the hospital which helped him diurese well on discharge they stated that his weight was 408 pounds which is still higher than his dry weight of 375 to 380 pounds. Upon discharge his torsemide was increased from 40 to 60 mg twice daily and he is to follow-up with cardiology as well. 12/29-Patient returns at 2 weeks for evaluation and follow-up of his bilateral lower extremity wounds, with lymphedema, venous ulcerations, patient was recently hospitalized and was aggressively diuresed after which his torsemide has been increased. He follows up with cardiology as well. His left leg stasis wounds are more moist and have maceration of tissue, the right one is less so. We are using silver cell and unna compression given the degree of edema in the legs. Readmission: 05/04/2019 patient presents today for reevaluation here in the clinic I have not seen him since December 2020. Subsequently this is a fairly sick individual with multiple comorbidities who presents for follow-up concerning wounds of his bilateral lower extremities. He tells  me that after he was in the hospital following when we saw him last in December that he ended up subsequently being discharged with home health services and they have been coming out and applying Unna boot wraps. With that being said he has used up all of his home health visits for the year and then for the last week has not had anything on his legs as far as the wrap is concerned. Obviously this is not good and he does come in today with increased swelling or at least what I perceived to be along with somewhat dry wounds again he does not seem to be weeping too much at this point which is at least good news but he has significant lymphedema/venous stasis. No fevers, chills, nausea, vomiting, or diarrhea. 06/01/2019 upon evaluation today patient is seen after not having been seen actually since 19 2021. He kept coming for appointment with every time he would come his temperature would be elevated past the point where we could see him. I told him that he needed to go be seen at the ER during 1 of these times due to the fact that obviously this was a fever of unknown origin at that point and my concern was that it could potentially be his legs but again I was not entirely sure as obviously I could not see them due to the restrictions secondary to Covid even bringing the patient into the clinic. Nonetheless he finally came  in today his temperature again was elevated but not to the same degree that it met the 100.4 or above guidelines of having to cancel the appointment. Either way we did have a look at his legs unfortunately he appears to show signs of cellulitis at this point which is probably where the temperature elevations are emanating from. He has significant lymphedema as well as chronic vent venous stasis as well. When I initially told him that is why I told him to go to the ER previously when his temperature was elevated in case it was something with wounds or otherwise he replied "nobody ever  told me to go to the ER". I then subsequently reminded him of me coming to the front desk due to his temperature being elevated and advising him to go to the ER to which she replied "I am not going to the ER and I do not think I need to even with this". Nonetheless I am concerned about the infection of his legs at this point he has significant weeping, significant edema, and overall is not doing very well in my opinion. [name] [name], [name] [name] ([id number]) Objective Constitutional Well-nourished and well-hydrated in no acute distress. Vitals Time Taken: 9:25 AM, Height: 68 in, Weight: 400 lbs, BMI: 60.8, Temperature: 100.0 F, Pulse: 86 bpm, Respiratory Rate: 22 breaths/min, Blood Pressure: 117/44 mmHg. Respiratory normal breathing without difficulty. Psychiatric this patient is able to make decisions and demonstrates good insight into disease process. Alert and Oriented x 3. pleasant and cooperative. General Notes: Upon inspection patient had erythema and warmth to both lower extremities. We did actually culture the left lower extremity today in order to evaluate for what bacteria could potentially be the causative organism. With that being said the patient absolutely refused to go to the ER which is the reason that I did perform the culture and subsequently prescribed antibiotics for him. It was not my intention nor do I think it is the best course of action but again otherwise the patient was good to leave with no intervention and so out of a sure desire to try to see him improved in some way I did prescribe this. Nonetheless I think that if anything changes or he feels bad in any way shape or form he should go to the ER ASAP. This was discussed in great detail with the patient today Di Kindle was present during the evaluation and the entire conversation. Integumentary (Hair, Skin) Wound #3 status is Open. Original cause of wound was Gradually Appeared. The wound is located on the Right,Proximal,Lateral  Lower Leg. The wound measures 7.6cm length x 11.5cm width x 0.1cm depth; 68.644cm^2 area and 6.864cm^3 volume. There is Fat Layer (Subcutaneous Tissue) Exposed exposed. There is no tunneling or undermining noted. There is a medium amount of serous drainage noted. The wound margin is flat and intact. There is medium (34-66%) pink granulation within the wound bed. There is a medium (34-66%) amount of necrotic tissue within the wound bed including Adherent Slough. Wound #4 status is Converted. Original cause of wound was Gradually Appeared. The wound is located on the Right,Distal,Lateral Lower Leg. Wound #5 status is Open. Original cause of wound was Gradually Appeared. The wound is located on the Left,Lateral Lower Leg. The wound measures 12cm length x 9cm width x 0.1cm depth; 84.823cm^2 area and 8.482cm^3 volume. There is Fat Layer (Subcutaneous Tissue) Exposed exposed. There is no tunneling or undermining noted. There is a medium amount of serous drainage noted. The wound margin is flat and  intact. There is medium (34-66%) pink granulation within the wound bed. There is a medium (34-66%) amount of necrotic tissue within the wound bed including Adherent Slough. Wound #6 status is Open. Original cause of wound was Gradually Appeared. The wound is located on the Left,Posterior Lower Leg. The wound measures 8cm length x 5cm width x 0.1cm depth; 31.416cm^2 area and 3.142cm^3 volume. There is Fat Layer (Subcutaneous Tissue) Exposed exposed. There is no tunneling or undermining noted. There is a large amount of serous drainage noted. The wound margin is indistinct and nonvisible. There is medium (34-66%) pink granulation within the wound bed. There is a medium (34-66%) amount of necrotic tissue within the wound bed including Adherent Slough. Assessment Active Problems ICD-10 Venous insufficiency (chronic) (peripheral) Lymphedema, not elsewhere classified Non-pressure chronic ulcer of other part of  right lower leg with fat layer exposed Non-pressure chronic ulcer of other part of left lower leg with fat layer exposed [place] (primary) hypertension Chronic obstructive pulmonary disease, unspecified Long term (current) use of anticoagulants Paroxysmal atrial fibrillation Personal history of pulmonary embolism Chronic combined systolic (congestive) and diastolic (congestive) heart failure [name] [name], [name] [name] ([id number]) Plan Wound Cleansing: Wound #3 Right,Proximal,Lateral Lower Leg: Cleanse wound with mild soap and water May shower with protection. Wound #5 Left,Lateral Lower Leg: Cleanse wound with mild soap and water May shower with protection. Wound #6 Left,Posterior Lower Leg: Cleanse wound with mild soap and water May shower with protection. Secondary Dressing: Wound #3 Right,Proximal,Lateral Lower Leg: ABD and Kerlix/Conform - secure with ace wrap Wound #5 Left,Lateral Lower Leg: ABD and Kerlix/Conform - secure with ace wrap Wound #6 Left,Posterior Lower Leg: ABD and Kerlix/Conform - secure with ace wrap Dressing Change Frequency: Wound #3 Right,Proximal,Lateral Lower Leg: Change dressing every other day. Wound #5 Left,Lateral Lower Leg: Change dressing every other day. Wound #6 Left,Posterior Lower Leg: Change dressing every other day. Follow-up Appointments: Return Appointment in 1 week. Edema Control: Wound #3 Right,Proximal,Lateral Lower Leg: Elevate legs to the level of the heart and pump ankles as often as possible Other: - ace wraps Wound #5 Left,Lateral Lower Leg: Elevate legs to the level of the heart and pump ankles as often as possible Other: - ace wraps Wound #6 Left,Posterior Lower Leg: Elevate legs to the level of the heart and pump ankles as often as possible Other: - ace wraps Laboratory ordered were: Wound culture routine The following medication(s) was prescribed: doxycycline hyclate oral 100 mg capsule 1 1 capsule oral taken 2 times per day  for 10 days. Do not take iron with this medication. Please advise your coumadin clinic of taking this. starting 06/01/2019 General Notes: Patient is advised to go to the ED today 1. Again I feel like the patient needs to go to the ER for further evaluation and therapy at this point. Unfortunately he is absolutely refusing AGAINST MEDICAL ADVICE to go to the ER. 2. I did obtain a wound culture in order to evaluate for what bacteria could be causing the issues here. Nonetheless I really am just can have to prescribe something more broad-spectrum for him. I did give him doxycycline today. Nonetheless we may have to alter the treatment regimen depending on the results of the culture. 3. The patient is severely swollen today as well and again he had significant weeping noted at this point. Nonetheless I am not to be able to compression wrap him currently due to the active infection. I explained this to him as well. He really was not pleased but nonetheless  there really was not much I could do. 4. I am going to also recommend at this point that we use an Ace wrap to secure the dressings in place and basically were just putting something on for absorptive purposes, XtraSorb, at this point. With that being said again I explained to the patient that my ideal treatment would not be that we just do this point that he go to the ER but again he was refusing that advice today. 5. With regard to the doxycycline that actually had the lowest interactions with compared the patient's other medications he did however have an interaction with his Coumadin I did put on the prescription as well that the patient needs to advise his Coumadin clinic that he is taking the doxycycline. We will see patient back for reevaluation in 1 week here in the clinic. If anything worsens or changes patient will contact our office for additional recommendations. Electronic Signature(s) [name] [name], [name] [name] ([id number]) Signed: 06/01/2019  11:10:27 AM By: Worthy Keeler PA-C Entered By: Worthy Keeler on 06/01/2019 11:10:27 [name] [name], [name] [name] ([id number]) -------------------------------------------------------------------------------- SuperBill Details Patient Name: [name] [name] Date of Service: 06/01/2019 Medical Record Number: [id number] Patient Account Number: [id number] Date of Birth/Sex: [birth year] (59 y.o. 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

## 2019-06-01 NOTE — Chronic Care Management (AMB) (Signed)
Care Management    Clinical Social Work Follow Up Note  06/01/2019 Name: [name] [name] MRN: [id number] DOB: 02/04/1961  [name] [name] is a 59 y.o. year old male who is a primary care patient of Flinchum, [name], FNP. The CCM team was consulted for assistance with [name] .   Review of patient status, including review of consultants reports, other relevant assessments, and collaboration with appropriate care team members and the patient's provider was performed as part of comprehensive patient evaluation and provision of chronic care management services.    SDOH (Social Determinants of Health) assessments performed: No    Outpatient Encounter Medications as of 06/01/2019  Medication Sig  . albuterol (VENTOLIN HFA) 108 (90 Base) MCG/ACT inhaler Inhale 2 puffs into the lungs every 6 (six) hours as needed for wheezing or shortness of breath.  . allopurinol (ZYLOPRIM) 100 MG tablet Take 1 tablet (100 mg total) by mouth daily.  [name] atorvastatin (LIPITOR) 40 MG tablet TAKE 1 TABLET BY MOUTH ONCE DAILY  . colchicine 0.6 MG tablet 1 tab PO q 12 hrs as needed until gout flare subsides, schedule on office follow up  . DULoxetine (CYMBALTA) 20 MG capsule Take 1 capsule (20 mg total) by mouth daily.  . ferrous sulfate 325 (65 FE) MG tablet Take 1 tablet (325 mg total) by mouth 2 (two) times daily with a meal.  . gabapentin (NEURONTIN) 300 MG capsule Take 1 capsule (300 mg total) by mouth 3 (three) times daily.  [name] levothyroxine (SYNTHROID) 50 MCG tablet Take 1 tablet (50 mcg total) by mouth daily before breakfast. Need lab work beginning of June for Thyroid  . lisinopril (ZESTRIL) 2.5 MG tablet Take 1 tablet (2.5 mg total) by mouth daily.  . metoprolol succinate (TOPROL-XL) 50 MG 24 hr tablet Take 1 tablet (50 mg total) by mouth 2 (two) times daily. Take with or immediately following a meal.  . nitroGLYCERIN (NITROSTAT) 0.4 MG SL tablet Place 1 tablet under tongue every 5 minutes as  needed for chest pain. (No more than 3 doses within 15 minutes)  . torsemide (DEMADEX) 20 MG tablet Take 3 tablets (60 mg total) by mouth 2 (two) times daily.  . traZODone (DESYREL) 150 MG tablet TAKE 1 TABLET BY MOUTH AT BEDTIME AS NEEDED SLEEP  . warfarin (COUMADIN) 5 MG tablet Take by mouth daily as directed by the anti-coag clinic   No facility-administered encounter medications on file as of 06/01/2019.     Goals Addressed            This Visit's Progress   . "I was denied more aid service, I need a aid (pt-stated)       CARE PLAN ENTRY (see longtitudinal plan of care for additional care plan information)  Current Barriers:  . ADL IADL limitations  Clinical Social Work Clinical Goal(s):  [name] Over the next 90 days, patient will work with [facility] to address needs related to be denied continued in home aid services  Interventions: . Follow up phone call regarding re-evaluation for personal care services . Patient confirmed that a nurse will come from [facility] to re-evaluate him next week . Patient discussed concern about the wounds on his legs and being placed on an antibiotic . Patient discussed increased need for an aid at this time and his hopeful that he is approved . Patient continues to confirm having no medication issues at this time as he now has his medications and receives them pill pack  form through [facility] .  Contacted the [facility] to request any possible assistance in getting a wheelchair lift for his van-spoke with Marguarite Arbour [phone] . Discussed plans with patient for ongoing care management follow up and provided patient with direct contact information for care management team   Patient Self Care Activities:  . Patient verbalizes understanding of plan to contact [name] to file an appeal for in home services . Unable to perform ADLs independently  Please see past updates related to this goal by clicking on  the "Past Updates" button in the selected goal          Follow Up Plan: SW will follow up with patient by phone over the next 14 business days    [place], [place] Worker  [facility] [phone]

## 2019-06-01 NOTE — Telephone Encounter (Signed)
Called and spoke to pt to verify how he is taking his warfarin. Pt stated that he has not taken any warfarin in the past 4 days because he ran out. However, pt stated that he was told by the home health nurse who talked to [facility] to take 2 tablets daily except for for 3 tablets on Tuesdays and Fridays.  Pt also stated that he was suppose to start an antibiotoic tonight but did not know the name and it was to be delivered to his house along with his warfarin. Called pt's pharmacy who stated that pt was to start taking doxycyline 100 mg bid for 10 days.   Instructed pt to take 3 tablets of warfarin today and tomorrow and then go back to taking 2 tablets daily. Pt has an appointment to check INR on 06/06/2019.

## 2019-06-01 NOTE — Progress Notes (Signed)
[name] [name] ([id number]) Visit Report for 06/01/2019 Arrival Information Details Patient Name: [name] [name], [name] [name] Date of Service: 06/01/2019 9:15 AM Medical Record Number: [id number] Patient Account Number: [id number] Date of Birth/Sex: Jul 03, 1960 (59 y.o. M) Treating RN: Curtis Sites Primary Care Cleatis Fandrich: Marvell Fuller Other Clinician: Referring Raunel Dimartino: Marvell Fuller Treating Aislin Onofre/Extender: Linwood Dibbles, HOYT Weeks in Treatment: 4 Visit Information History Since Last Visit Added or deleted any medications: No Patient Arrived: Wheel Chair Any new allergies or adverse reactions: No Arrival Time: 09:22 Had a fall or experienced change in No Accompanied By: self activities of daily living that may affect Transfer Assistance: None risk of falls: Patient Identification Verified: Yes Signs or symptoms of abuse/neglect since last visito No Secondary Verification Process Completed: Yes Hospitalized since last visit: No Patient Has Alerts: Yes Implantable device outside of the clinic excluding No Patient Alerts: Patient on Blood Thinner cellular tissue based products placed in the center Warfarin since last visit: Has Dressing in Place as Prescribed: No Has Compression in Place as Prescribed: No Pain Present Now: Yes Electronic Signature(s) Signed: 06/01/2019 4:33:37 PM By: Curtis Sites Entered By: Curtis Sites on 06/01/2019 09:22:48 [name] [name] ([id number]) -------------------------------------------------------------------------------- Clinic Level of Care Assessment Details Patient Name: [name] [name] Date of Service: 06/01/2019 9:15 AM Medical Record Number: [id number] Patient Account Number: [id number] Date of Birth/Sex: [birth year] (59 y.o. M) Treating RN: Curtis Sites Primary Care Kadasia Kassing: Marvell Fuller Other Clinician: Referring Goodwin Kamphaus: Marvell Fuller Treating Om Lizotte/Extender: Linwood Dibbles, HOYT Weeks in Treatment: 4 Clinic Level of  Care Assessment Items TOOL 4 Quantity Score []  - Use when only an EandM is performed on FOLLOW-UP visit 0 ASSESSMENTS - Nursing Assessment / Reassessment X - Reassessment of Co-morbidities (includes updates in patient status) 1 10 X- 1 5 Reassessment of Adherence to Treatment Plan ASSESSMENTS - Wound and Skin Assessment / Reassessment []  - Simple Wound Assessment / Reassessment - one wound 0 X- 3 5 Complex Wound Assessment / Reassessment - multiple wounds []  - 0 Dermatologic / Skin Assessment (not related to wound area) ASSESSMENTS - Focused Assessment X - Circumferential Edema Measurements - multi extremities 2 5 []  - 0 Nutritional Assessment / Counseling / Intervention X- 1 5 Lower Extremity Assessment (monofilament, tuning fork, pulses) []  - 0 Peripheral Arterial Disease Assessment (using hand held doppler) ASSESSMENTS - Ostomy and/or Continence Assessment and Care []  - Incontinence Assessment and Management 0 []  - 0 Ostomy Care Assessment and Management (repouching, etc.) PROCESS - Coordination of Care X - Simple Patient / Family Education for ongoing care 1 15 []  - 0 Complex (extensive) Patient / Family Education for ongoing care X- 1 10 Staff obtains Chiropractor, Records, Test Results / Process Orders []  - 0 Staff telephones HHA, Nursing Homes / Clarify orders / etc []  - 0 Routine Transfer to another Facility (non-emergent condition) []  - 0 Routine Hospital Admission (non-emergent condition) []  - 0 New Admissions / Manufacturing engineer / Ordering NPWT, Apligraf, etc. []  - 0 Emergency Hospital Admission (emergent condition) X- 1 10 Simple Discharge Coordination []  - 0 Complex (extensive) Discharge Coordination PROCESS - Special Needs []  - Pediatric / Minor Patient Management 0 []  - 0 Isolation Patient Management []  - 0 Hearing / Language / Visual special needs []  - 0 Assessment of Community assistance (transportation, D/C planning, etc.) [name], [name]  ([id number]) []  - 0 Additional assistance / Altered mentation []  - 0 Support Surface(s) Assessment (bed, cushion, seat, etc.) INTERVENTIONS - Wound Cleansing / Measurement []  - Simple Wound Cleansing - one wound 0 X-  3 5 Complex Wound Cleansing - multiple wounds X- 1 5 Wound Imaging (photographs - any number of wounds) []  - 0 Wound Tracing (instead of photographs) []  - 0 Simple Wound Measurement - one wound X- 3 5 Complex Wound Measurement - multiple wounds INTERVENTIONS - Wound Dressings []  - Small Wound Dressing one or multiple wounds 0 []  - 0 Medium Wound Dressing one or multiple wounds X- 2 20 Large Wound Dressing one or multiple wounds []  - 0 Application of Medications - topical []  - 0 Application of Medications - injection INTERVENTIONS - Miscellaneous []  - External ear exam 0 X- 1 5 Specimen Collection (cultures, biopsies, blood, body fluids, etc.) X- 1 5 Specimen(s) / Culture(s) sent or taken to Lab for analysis []  - 0 Patient Transfer (multiple staff / / Similar devices) []  - 0 Simple Staple / Suture removal (25 or less) []  - 0 Complex Staple / Suture removal (26 or more) []  - 0 Hypo / Hyperglycemic Management (close monitor of Blood Glucose) []  - 0 Ankle / Brachial Index (ABI) - do not check if billed separately X- 1 5 Vital Signs Has the patient been seen at the hospital within the last three years: Yes Total Score: 170 Level Of Care: New/Established - Level 5 Electronic Signature(s) Signed: 06/01/2019 4:33:37 PM By: Entered By: on 06/01/2019 11:03:09 ( ) -------------------------------------------------------------------------------- Encounter Discharge Information Details Patient Name: Date of Service: 06/01/2019 9:15 AM Medical Record Number: Patient Account Number: Date of Birth/Sex: 01-01-1961 (59 y.o. M) Treating RN: 06/03/2019 Primary  Care Jehiel Koepp: Curtis Sites Other Clinician: Referring Taetum Flewellen: Curtis Sites Treating Melyssa Signor/Extender: 06/03/2019, HOYT Weeks in Treatment: 4 Encounter Discharge Information Items Discharge Condition: Stable Ambulatory Status: Wheelchair Discharge Destination: Home Transportation: Private Auto Accompanied By: self Schedule Follow-up Appointment: Yes Clinical Summary of Care: Electronic Signature(s) Signed: 06/01/2019 11:04:38 AM By: [id number] Entered By: [name] [name] on 06/01/2019 11:04:38 [id number] ([id number]) -------------------------------------------------------------------------------- Lower Extremity Assessment Details Patient Name: 09/08/1960 Date of Service: 06/01/2019 9:15 AM Medical Record Number: Curtis Sites Patient Account Number: Marvell Fuller Date of Birth/Sex: 1960-06-18 (59 y.o. M) Treating RN: 06/03/2019 Primary Care Momodou Consiglio: Curtis Sites Other Clinician: Referring Lakesia Dahle: Curtis Sites Treating Bryar Dahms/Extender: STONE III, HOYT Weeks in Treatment: 4 Edema Assessment Assessed: [Left: No] [Right: No] Edema: [Left: Yes] [Right: Yes] Calf Left: Right: Point of Measurement: 31 cm From Medial Instep 60.5 cm 60 cm Ankle Left: Right: Point of Measurement: 13 cm From Medial Instep 36 cm 35 cm Vascular Assessment Pulses: Dorsalis Pedis Palpable: [Left:Yes] [Right:Yes] Electronic Signature(s) Signed: 06/01/2019 4:33:37 PM By: [name] [name] Entered By: [id number] on 06/01/2019 09:38:26 06/03/2019 ([id number]) -------------------------------------------------------------------------------- Multi Wound Chart Details Patient Name: [id number] Date of Service: 06/01/2019 9:15 AM Medical Record Number: [id number] Patient Account Number: Curtis Sites Date of Birth/Sex: [birth year] (59 y.o. 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 Comorbid History: Asthma, Chronic Obstructive N/A Asthma, Chronic Obstructive Pulmonary Disease (COPD), Sleep Pulmonary Disease (COPD), Sleep Apnea, Arrhythmia, Congestive Apnea, Arrhythmia, Congestive Heart Failure, Hypertension,  Heart Failure, Hypertension, Myocardial Infarction, Type II Myocardial Infarction, Type II Diabetes Diabetes Date Acquired: 02/16/2019 02/12/2019 02/12/2019 Weeks of Treatment: 4 4 4  Wound Status: Open Converted Open Clustered Wound: No No Yes Clustered Quantity: N/A N/A 2 Measurements L x W x D (cm) 7.6x11.5x0.1 N/A 12x9x0.1 Area (cm) : 68.644 N/A 84.823 Volume (cm) : 6.864 N/A 8.482 % Reduction in Area: -18108.00% N/A -328.60% % Reduction in Volume: -17963.20% N/A -328.60% Classification: Grade 1 Full Thickness Without Exposed Full Thickness Without Exposed Support Structures Support Structures Exudate Amount: Medium N/A Medium Exudate Type: Serous N/A Serous Exudate Color: amber N/A amber Wound Margin: Flat and Intact N/A Flat and Intact Granulation Amount: Medium (34-66%) N/A Medium (34-66%) Granulation Quality: Pink N/A Pink Necrotic Amount: Medium (34-66%) N/A Medium (34-66%) Exposed Structures: Fat Layer (Subcutaneous Tissue) N/A Fat Layer (Subcutaneous Tissue) Exposed: Yes Exposed: Yes Fascia: No Fascia: No Tendon: No Tendon: No Muscle: No Muscle: No Joint: No Joint: No Bone: No Bone: No Epithelialization:  Small (1-33%) N/A Small (1-33%) Wound Number: 6 N/A N/A LORIMER, TIBERIO ([name] [name]) Photos: N/A N/A Wound Location: Left, Posterior Lower Leg N/A N/A Wounding Event: Gradually Appeared N/A N/A Primary Etiology: Lymphedema N/A N/A Comorbid History: Asthma, Chronic Obstructive N/A N/A Pulmonary Disease (COPD), Sleep Apnea, Arrhythmia, Congestive Heart Failure, Hypertension, Myocardial Infarction, Type II Diabetes Date Acquired: 06/01/2019 N/A N/A Weeks of Treatment: 0 N/A N/A Wound Status: Open N/A N/A Clustered Wound: No N/A N/A Clustered Quantity: N/A N/A N/A Measurements L x W x D (cm) 8x5x0.1 N/A N/A Area (cm) : 31.416 N/A N/A Volume (cm) : 3.142 N/A N/A % Reduction in Area: 0.00% N/A N/A % Reduction in Volume: 0.00% N/A N/A Classification: Full Thickness Without Exposed N/A N/A Support Structures Exudate Amount: Large N/A N/A Exudate Type: Serous N/A N/A Exudate Color: amber N/A N/A Wound Margin: Indistinct, nonvisible N/A N/A Granulation Amount: Medium (34-66%) N/A N/A Granulation Quality: Pink N/A N/A Necrotic Amount: Medium (34-66%) N/A N/A Exposed Structures: Fat Layer (Subcutaneous Tissue) N/A N/A Exposed: Yes Fascia: No Tendon: No Muscle: No Joint: No Bone: No Epithelialization: Medium (34-66%) N/A N/A Treatment Notes Electronic Signature(s) Signed: 06/01/2019 4:33:37 PM By: 06/03/2019 Entered By: Curtis Sites on 06/01/2019 10:28:54 06/03/2019 ([name] [name]) -------------------------------------------------------------------------------- Multi-Disciplinary Care Plan Details Patient Name: [id number] Date of Service: 06/01/2019 9:15 AM Medical Record Number: 06/03/2019 Patient Account Number: [id number] Date of Birth/Sex: Jun 28, 1960 (59 y.o. M) Treating RN: 41 Primary Care Cordero Surette: Curtis Sites Other Clinician: Referring Everlean Bucher: Marvell Fuller Treating Ellise Kovack/Extender: Marvell Fuller, HOYT Weeks in Treatment: 4 Active  Inactive Abuse / Safety / Falls / Self Care Management Nursing Diagnoses: Potential for falls Goals: Patient will remain injury free related to falls Date Initiated: 05/04/2019 Target Resolution Date: 07/21/2019 Goal Status: Active Interventions: Assess fall risk on admission and as needed Provide education on basic hygiene Notes: Nutrition Nursing Diagnoses: Impaired glucose control: actual or potential Goals: Patient/caregiver agrees to and verbalizes understanding of need to use nutritional supplements and/or vitamins as prescribed Date Initiated: 05/04/2019 Target Resolution Date: 07/21/2019 Goal Status: Active Interventions: Assess patient nutrition upon admission and as needed per policy Notes: Orientation to the Wound Care Program Nursing Diagnoses: Knowledge deficit related to the wound healing center program Goals: Patient/caregiver will verbalize understanding of the Wound Healing Center Program Date Initiated: 05/04/2019 Target Resolution Date: 07/21/2019 Goal Status: Active Interventions: Provide education on orientation to the wound center Notes: Venous Leg Ulcer Nursing Diagnoses: Potential for venous Insuffiency (use before diagnosis confirmed) [name] [name], [name] [name] ([name] [name]) Goals:  Patient will maintain optimal edema control Date Initiated: 05/04/2019 Target Resolution Date: 07/21/2019 Goal Status: Active Interventions: Compression as ordered Notes: Wound/Skin Impairment Nursing Diagnoses: Impaired tissue integrity Goals: Ulcer/skin breakdown will heal within 14 weeks Date Initiated: 05/04/2019 Target Resolution Date: 07/21/2019 Goal Status: Active Interventions: Assess patient/caregiver ability to obtain necessary supplies Assess patient/caregiver ability to perform ulcer/skin care regimen upon admission and as needed Assess ulceration(s) every visit Notes: Electronic Signature(s) Signed: 06/01/2019 4:33:37 PM By: Montey Hora Entered By: Montey Hora  on 06/01/2019 10:28:29 [name] [name] ([id number]) -------------------------------------------------------------------------------- Pain Assessment Details Patient Name: [name] [name] Date of Service: 06/01/2019 9:15 AM Medical Record Number: [id number] Patient Account Number: [id number] Date of Birth/Sex: 1960/11/06 (59 y.o. M) Treating RN: Montey Hora Primary Care Bekka Qian: Laverna Peace Other Clinician: Referring Raiya Stainback: Laverna Peace Treating Danely Bayliss/Extender: Melburn Hake, HOYT Weeks in Treatment: 4 Active Problems Location of Pain Severity and Description of Pain Patient Has Paino Yes Site Locations Pain Management and Medication Current Pain Management: Electronic Signature(s) Signed: 06/01/2019 4:33:37 PM By: Montey Hora Entered By: Montey Hora on 06/01/2019 09:23:00 [name] [name] ([id number]) -------------------------------------------------------------------------------- Patient/Caregiver Education Details Patient Name: [name] [name] Date of Service: 06/01/2019 9:15 AM Medical Record Number: [id number] Patient Account Number: [id number] Date of Birth/Gender: 04-25-1960 (59 y.o. M) Treating RN: Montey Hora Primary Care Physician: Laverna Peace Other Clinician: Referring Physician: Laverna Peace Treating Physician/Extender: Sharalyn Ink in Treatment: 4 Education Assessment Education Provided To: Patient Education Topics Provided Wound/Skin Impairment: Handouts: Other: need to go to the hospital, wound care as directed Methods: Explain/Verbal Electronic Signature(s) Signed: 06/01/2019 4:33:37 PM By: Montey Hora Entered By: Montey Hora on 06/01/2019 11:03:53 [name] [name] ([id number]) -------------------------------------------------------------------------------- Wound Assessment Details Patient Name: [name] [name] Date of Service: 06/01/2019 9:15 AM Medical Record Number: [id number] Patient Account Number:  [id number] Date of Birth/Sex: 10/26/1960 (59 y.o. M) Treating RN: Montey Hora Primary Care Talena Neira: Laverna Peace Other Clinician: Referring Terrika Zuver: Laverna Peace Treating Ronne Savoia/Extender: STONE III, HOYT Weeks in Treatment: 4 Wound Status Wound Number: 3 Primary Diabetic Wound/Ulcer of the Lower Extremity Etiology: Wound Location: Right, Proximal, Lateral Lower Leg Wound Open Wounding Event: Gradually Appeared Status: Date Acquired: 02/16/2019 Comorbid Asthma, Chronic Obstructive Pulmonary Disease (COPD), Weeks Of Treatment: 4 History: Sleep Apnea, Arrhythmia, Congestive Heart Failure, Clustered Wound: No Hypertension, Myocardial Infarction, Type II Diabetes Photos Wound Measurements Length: (cm) 7.6 Width: (cm) 11.5 Depth: (cm) 0.1 Area: (cm) 68.644 Volume: (cm) 6.864 % Reduction in Area: -18108% % Reduction in Volume: -17963.2% Epithelialization: Small (1-33%) Tunneling: No Undermining: No Wound Description Classification: Grade 1 Fo Wound Margin: Flat and Intact Sl Exudate Amount: Medium Exudate Type: Serous Exudate Color: amber ul Odor After Cleansing: No ough/Fibrino Yes Wound Bed Granulation Amount: Medium (34-66%) Exposed Structure Granulation Quality: Pink Fascia Exposed: No Necrotic Amount: Medium (34-66%) Fat Layer (Subcutaneous Tissue) Exposed: Yes Necrotic Quality: Adherent Slough Tendon Exposed: No Muscle Exposed: No Joint Exposed: No Bone Exposed: No Treatment Notes Wound #3 (Right, Proximal, Lateral Lower Leg) Notes abd, kerlix and ace wraps - patient advised to go to the ED Electronic Signature(s) [name] [name], [name] [name] ([id number]) Signed: 06/01/2019 4:33:37 PM By: Montey Hora Entered By: Montey Hora on 06/01/2019 09:35:19 [name] [name] ([id number]) -------------------------------------------------------------------------------- Wound Assessment Details Patient Name: [name] [name] Date of Service: 06/01/2019 9:15  AM Medical Record Number: [id number] Patient Account Number: [id number] Date of Birth/Sex: October 23, 1960 (59 y.o. M) Treating RN: Montey Hora Primary Care Brennyn Ortlieb: Laverna Peace Other Clinician: Referring Brodi Kari: Laverna Peace Treating Joandy Burget/Extender: STONE III, HOYT Weeks in Treatment: 4 Wound Status Wound  Number: 4 Primary Etiology: Lymphedema Wound Location: Right, Distal, Lateral Lower Leg Wound Status: Converted Wounding Event: Gradually Appeared Date Acquired: 02/12/2019 Weeks Of Treatment: 4 Clustered Wound: No Wound Description Classification: Full Thickness Without Exposed Support Structu res Electronic Signature(s) Signed: 06/01/2019 4:33:37 PM By: Curtis Sites Entered By: Curtis Sites on 06/01/2019 09:29:09 [name] [name] ([id number]) -------------------------------------------------------------------------------- Wound Assessment Details Patient Name: [name] [name] Date of Service: 06/01/2019 9:15 AM Medical Record Number: [id number] Patient Account Number: [id number] Date of Birth/Sex: [birth year] (59 y.o. M) Treating RN: Curtis Sites Primary Care Adaliz Dobis: Marvell Fuller Other Clinician: Referring Makailee Nudelman: Marvell Fuller Treating Leviathan Macera/Extender: STONE III, HOYT Weeks in Treatment: 4 Wound Status Wound Number: 5 Primary Lymphedema Etiology: Wound Location: Left, Lateral Lower Leg Wound Open Wounding Event: Gradually Appeared Status: Date Acquired: 02/12/2019 Comorbid Asthma, Chronic Obstructive Pulmonary Disease (COPD), Weeks Of Treatment: 4 History: Sleep Apnea, Arrhythmia, Congestive Heart Failure, Clustered Wound: Yes Hypertension, Myocardial Infarction, Type II Diabetes Photos Wound Measurements Length: (cm) 12 Width: (cm) 9 Depth: (cm) 0.1 Clustered Quantity: 2 Area: (cm) 84.823 Volume: (cm) 8.482 % Reduction in Area: -328.6% % Reduction in Volume: -328.6% Epithelialization: Small (1-33%) Tunneling:  No Undermining: No Wound Description Classification: Full Thickness Without Exposed Support Struct Wound Margin: Flat and Intact Exudate Amount: Medium Exudate Type: Serous Exudate Color: amber ures Foul Odor After Cleansing: No Slough/Fibrino Yes Wound Bed Granulation Amount: Medium (34-66%) Exposed Structure Granulation Quality: Pink Fascia Exposed: No Necrotic Amount: Medium (34-66%) Fat Layer (Subcutaneous Tissue) Exposed: Yes Necrotic Quality: Adherent Slough Tendon Exposed: No Muscle Exposed: No Joint Exposed: No Bone Exposed: No Treatment Notes Wound #5 (Left, Lateral Lower Leg) Notes abd, kerlix and ace wraps - patient advised to go to the ED [name] [name], [name] [name] ([id number]) Electronic Signature(s) Signed: 06/01/2019 4:33:37 PM By: Curtis Sites Entered By: Curtis Sites on 06/01/2019 09:35:43 [name] [name] ([id number]) -------------------------------------------------------------------------------- Wound Assessment Details Patient Name: [name] [name] Date of Service: 06/01/2019 9:15 AM Medical Record Number: [id number] Patient Account Number: [id number] Date of Birth/Sex: [birth year] (59 y.o. M) Treating RN: Curtis Sites Primary Care Alondria Mousseau: Marvell Fuller Other Clinician: Referring Jaisa Defino: Marvell Fuller Treating Shoua Ulloa/Extender: STONE III, HOYT Weeks in Treatment: 4 Wound Status Wound Number: 6 Primary Lymphedema Etiology: Wound Location: Left, Posterior Lower Leg Wound Open Wounding Event: Gradually Appeared Status: Date Acquired: 06/01/2019 Comorbid Asthma, Chronic Obstructive Pulmonary Disease (COPD), Weeks Of Treatment: 0 History: Sleep Apnea, Arrhythmia, Congestive Heart Failure, Clustered Wound: No Hypertension, Myocardial Infarction, Type II Diabetes Photos Wound Measurements Length: (cm) 8 Width: (cm) 5 Depth: (cm) 0.1 Area: (cm) 31.416 Volume: (cm) 3.142 % Reduction in Area: 0% % Reduction in Volume:  0% Epithelialization: Medium (34-66%) Tunneling: No Undermining: No Wound Description Classification: Full Thickness Without Exposed Support Struct Wound Margin: Indistinct, nonvisible Exudate Amount: Large Exudate Type: Serous Exudate Color: amber ures Foul Odor After Cleansing: No Slough/Fibrino Yes Wound Bed Granulation Amount: Medium (34-66%) Exposed Structure Granulation Quality: Pink Fascia Exposed: No Necrotic Amount: Medium (34-66%) Fat Layer (Subcutaneous Tissue) Exposed: Yes Necrotic Quality: Adherent Slough Tendon Exposed: No Muscle Exposed: No Joint Exposed: No Bone Exposed: No Treatment Notes Wound #6 (Left, Posterior Lower Leg) Notes abd, kerlix and ace wraps - patient advised to go to the ED Electronic Signature(s) [name] [name], [name] [name] ([id number]) Signed: 06/01/2019 4:33:37 PM By: Curtis Sites Entered By: Curtis Sites on 06/01/2019 09:33:22 [name] [name] ([id number]) -------------------------------------------------------------------------------- Vitals Details Patient Name: [name] [name] Date of Service: 06/01/2019 9:15 AM Medical Record Number: [id number] Patient Account Number: [id number] Date of Birth/Sex: [birth year] (59 y.o. M) Treating RN:  Curtis Sites Primary Care Lopez Dentinger: Marvell Fuller Other Clinician: Referring Hager Compston: Marvell Fuller Treating Tejal Monroy/Extender: Linwood Dibbles, HOYT Weeks in Treatment: 4 Vital Signs Time Taken: 09:25 Temperature (F): 100.0 Height (in): 68 Pulse (bpm): 86 Weight (lbs): 400 Respiratory Rate (breaths/min): 22 Body Mass Index (BMI): 60.8 Blood Pressure (mmHg): 117/44 Reference Range: 80 - 120 mg / dl Electronic Signature(s) Signed: 06/01/2019 4:33:37 PM By: Curtis Sites Entered By: Curtis Sites on 06/01/2019 09:27:08

## 2019-06-04 ENCOUNTER — Ambulatory Visit: Payer: Self-pay | Admitting: *Deleted

## 2019-06-04 LAB — AEROBIC CULTURE W GRAM STAIN (SUPERFICIAL SPECIMEN)

## 2019-06-04 NOTE — Chronic Care Management (AMB) (Signed)
   Care Management   Social Work Note  06/04/2019 Name: [name] [name] MRN: [id number] DOB: [birth year]  [name] [name] is a 59 y.o. year old male who sees Flinchum, [name], FNP for primary care. The CCM team was consulted for assistance with [name] .   Phone call from the disabilities advocacy group stating that patient's request for a wheelchair    lift has been assigned to case manager Maggie [phone] ext [phone]. She will assist in identifying donors that may be able to assist with this. SDOH (Social Determinants of Health) assessments performed: No     Outpatient Encounter Medications as of 06/04/2019  Medication Sig  . albuterol (VENTOLIN HFA) 108 (90 Base) MCG/ACT inhaler Inhale 2 puffs into the lungs every 6 (six) hours as needed for wheezing or shortness of breath.  . allopurinol (ZYLOPRIM) 100 MG tablet Take 1 tablet (100 mg total) by mouth daily.  [name] atorvastatin (LIPITOR) 40 MG tablet TAKE 1 TABLET BY MOUTH ONCE DAILY  . colchicine 0.6 MG tablet 1 tab PO q 12 hrs as needed until gout flare subsides, schedule on office follow up  . DULoxetine (CYMBALTA) 20 MG capsule Take 1 capsule (20 mg total) by mouth daily.  . ferrous sulfate 325 (65 FE) MG tablet Take 1 tablet (325 mg total) by mouth 2 (two) times daily with a meal.  . gabapentin (NEURONTIN) 300 MG capsule Take 1 capsule (300 mg total) by mouth 3 (three) times daily.  [name] levothyroxine (SYNTHROID) 50 MCG tablet Take 1 tablet (50 mcg total) by mouth daily before breakfast. Need lab work beginning of June for Thyroid  . lisinopril (ZESTRIL) 2.5 MG tablet Take 1 tablet (2.5 mg total) by mouth daily.  . metoprolol succinate (TOPROL-XL) 50 MG 24 hr tablet Take 1 tablet (50 mg total) by mouth 2 (two) times daily. Take with or immediately following a meal.  . nitroGLYCERIN (NITROSTAT) 0.4 MG SL tablet Place 1 tablet under tongue every 5 minutes as needed for chest pain. (No more than 3 doses within 15 minutes)  .  torsemide (DEMADEX) 20 MG tablet Take 3 tablets (60 mg total) by mouth 2 (two) times daily.  . traZODone (DESYREL) 150 MG tablet TAKE 1 TABLET BY MOUTH AT BEDTIME AS NEEDED SLEEP  . warfarin (COUMADIN) 5 MG tablet Take by mouth daily as directed by the anti-coag clinic   No facility-administered encounter medications on file as of 06/04/2019.    Goals Addressed   None     Follow Up Plan: SW will follow up with patient by phone over the next 7-14 business days   Aeron Lheureux, [place] Clinical Social Worker  [facility] Care [facility] [phone]

## 2019-06-04 NOTE — Telephone Encounter (Signed)
Pt advised and states [facility] has already been in contact with him

## 2019-06-05 NOTE — Telephone Encounter (Signed)
Relation to pt: self  Call back number: [phone]  Reason for call:   Patient checking on the orders for home health aid stating he has not heard from nurse, please advise

## 2019-06-06 ENCOUNTER — Ambulatory Visit: Payer: Self-pay | Admitting: *Deleted

## 2019-06-06 ENCOUNTER — Encounter: Payer: Self-pay | Admitting: Internal Medicine

## 2019-06-06 ENCOUNTER — Other Ambulatory Visit: Payer: Self-pay

## 2019-06-06 ENCOUNTER — Ambulatory Visit (INDEPENDENT_AMBULATORY_CARE_PROVIDER_SITE_OTHER): Payer: Medicaid Other | Admitting: Internal Medicine

## 2019-06-06 ENCOUNTER — Ambulatory Visit (INDEPENDENT_AMBULATORY_CARE_PROVIDER_SITE_OTHER): Payer: Medicaid Other

## 2019-06-06 VITALS — BP 100/66 | HR 71 | Ht 68.0 in | Wt >= 6400 oz

## 2019-06-06 DIAGNOSIS — Z86718 Personal history of other venous thrombosis and embolism: Secondary | ICD-10-CM | POA: Diagnosis not present

## 2019-06-06 DIAGNOSIS — G510 Bell's palsy: Secondary | ICD-10-CM | POA: Diagnosis not present

## 2019-06-06 DIAGNOSIS — G894 Chronic pain syndrome: Secondary | ICD-10-CM

## 2019-06-06 DIAGNOSIS — I890 Lymphedema, not elsewhere classified: Secondary | ICD-10-CM | POA: Diagnosis not present

## 2019-06-06 DIAGNOSIS — I2510 Atherosclerotic heart disease of native coronary artery without angina pectoris: Secondary | ICD-10-CM | POA: Diagnosis not present

## 2019-06-06 DIAGNOSIS — M1611 Unilateral primary osteoarthritis, right hip: Secondary | ICD-10-CM | POA: Diagnosis not present

## 2019-06-06 DIAGNOSIS — I4819 Other persistent atrial fibrillation: Secondary | ICD-10-CM

## 2019-06-06 DIAGNOSIS — G8194 Hemiplegia, unspecified affecting left nondominant side: Secondary | ICD-10-CM | POA: Diagnosis not present

## 2019-06-06 DIAGNOSIS — Z86711 Personal history of pulmonary embolism: Secondary | ICD-10-CM | POA: Diagnosis not present

## 2019-06-06 DIAGNOSIS — Z5181 Encounter for therapeutic drug level monitoring: Secondary | ICD-10-CM | POA: Diagnosis not present

## 2019-06-06 DIAGNOSIS — M10041 Idiopathic gout, right hand: Secondary | ICD-10-CM | POA: Diagnosis not present

## 2019-06-06 DIAGNOSIS — E662 Morbid (severe) obesity with alveolar hypoventilation: Secondary | ICD-10-CM | POA: Diagnosis not present

## 2019-06-06 DIAGNOSIS — I5042 Chronic combined systolic (congestive) and diastolic (congestive) heart failure: Secondary | ICD-10-CM | POA: Diagnosis not present

## 2019-06-06 DIAGNOSIS — E785 Hyperlipidemia, unspecified: Secondary | ICD-10-CM | POA: Diagnosis not present

## 2019-06-06 DIAGNOSIS — I482 Chronic atrial fibrillation, unspecified: Secondary | ICD-10-CM

## 2019-06-06 DIAGNOSIS — Z6841 Body Mass Index (BMI) 40.0 and over, adult: Secondary | ICD-10-CM | POA: Diagnosis not present

## 2019-06-06 DIAGNOSIS — M545 Low back pain: Secondary | ICD-10-CM | POA: Diagnosis not present

## 2019-06-06 DIAGNOSIS — G4733 Obstructive sleep apnea (adult) (pediatric): Secondary | ICD-10-CM | POA: Diagnosis not present

## 2019-06-06 DIAGNOSIS — D649 Anemia, unspecified: Secondary | ICD-10-CM | POA: Diagnosis not present

## 2019-06-06 DIAGNOSIS — L97821 Non-pressure chronic ulcer of other part of left lower leg limited to breakdown of skin: Secondary | ICD-10-CM | POA: Diagnosis not present

## 2019-06-06 DIAGNOSIS — I872 Venous insufficiency (chronic) (peripheral): Secondary | ICD-10-CM | POA: Diagnosis not present

## 2019-06-06 DIAGNOSIS — I5022 Chronic systolic (congestive) heart failure: Secondary | ICD-10-CM | POA: Diagnosis not present

## 2019-06-06 DIAGNOSIS — Z7901 Long term (current) use of anticoagulants: Secondary | ICD-10-CM

## 2019-06-06 DIAGNOSIS — Z792 Long term (current) use of antibiotics: Secondary | ICD-10-CM | POA: Diagnosis not present

## 2019-06-06 DIAGNOSIS — E1151 Type 2 diabetes mellitus with diabetic peripheral angiopathy without gangrene: Secondary | ICD-10-CM | POA: Diagnosis not present

## 2019-06-06 DIAGNOSIS — L97811 Non-pressure chronic ulcer of other part of right lower leg limited to breakdown of skin: Secondary | ICD-10-CM | POA: Diagnosis not present

## 2019-06-06 DIAGNOSIS — I110 Hypertensive heart disease with heart failure: Secondary | ICD-10-CM | POA: Diagnosis not present

## 2019-06-06 DIAGNOSIS — J449 Chronic obstructive pulmonary disease, unspecified: Secondary | ICD-10-CM | POA: Diagnosis not present

## 2019-06-06 DIAGNOSIS — F321 Major depressive disorder, single episode, moderate: Secondary | ICD-10-CM | POA: Diagnosis not present

## 2019-06-06 DIAGNOSIS — I2699 Other pulmonary embolism without acute cor pulmonale: Secondary | ICD-10-CM

## 2019-06-06 LAB — POCT INR: INR: 8 — AB (ref 2.0–3.0)

## 2019-06-06 MED ORDER — METOPROLOL SUCCINATE ER 50 MG PO TB24: 50.0000 mg | ORAL_TABLET | Freq: Every day | ORAL | 4 refills | Status: DC

## 2019-06-06 NOTE — Patient Instructions (Signed)
Hold warfarin until you receive a call from our office with dosing instructions. Have at least 1 cup of kale or spinach tonight. Recheck INR on Monday, 4/26.

## 2019-06-06 NOTE — Progress Notes (Signed)
Follow-up Outpatient Visit Date: 06/06/2019  Primary Care Provider: Berniece Pap, FNP [address] [place] [address]  Chief Complaint: Follow-up heart failure and atrial fibrillation  HPI:  Mr. [name] [name] is a 59 y.o. 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  He was last seen in our office on 04/02/2019 complaining of a 1 week history of facial droop.  It was felt this was most consistent with Bell's palsy.  He was started on a prednisone taper and advised to follow-up with his PCP.  He refused to go to the ED for further evaluation of possible stroke on multiple occasions.  He was subsequently diagnosed with Bell's palsy and reports that he still has numbness and weakness on the left side of his face.  Irritation of the left eye has also impaired his vision.  He was referred for MRI of the brain by his PCP to exclude stroke but has been unable to arrange for this due to transportation issues.  From a heart standpoint, [name] is about the same.  He has marked exertional dyspnea when walking more than a few feet.  He ambulates with a walker.  He denies chest pains, palpitations, and lightheadedness, though he notes that his aforementioned vision problems secondary to Bell's palsy are making him feel dizzy.  Leg swelling seems to have worsened.  He has wounds on both feet that he is wrapping.  He has been going to the wound care clinic but inquires about having home health come back to his home to assist him with dressing changes.  Reports being compliant with his medications, though he was out of Coumadin for about a week and a half.  He restarted this a little over a week ago.  Of note, his INR has increased precipitously since  restarting Coumadin, with POC INR today being greater than 8.  --------------------------------------------------------------------------------------------------  Past Medical History:  Diagnosis Date  . Anxiety   . Arthritis   . Asthma   . Brain damage   . Chronic pain of both knees 07/13/2018  . Clotting disorder (HCC)   . COPD (chronic obstructive pulmonary disease) (HCC)   . Depression   . HFrEF (heart failure with reduced ejection fraction) (HCC)    a. 03/2018 Echo: EF 25-30%, diff HK. Mod LAE.  [name] History of DVT (deep vein thrombosis)   . History of pulmonary embolism    a. Chronic coumadin.  [name] Hypertension   . MI (myocardial infarction) (HCC)   . Morbid obesity (HCC)   . Neck pain 07/13/2018  . NICM (nonischemic cardiomyopathy) (HCC)    a. s/p Cath x 3 - reportedly nl cors. Last cath 2019 in [place]; b. a. 03/2018 Echo: EF 25-30%, diff HK.  [name] Persistent atrial fibrillation (HCC)    a. 03/2018 s/p DCCV; b. CHA2DS2VASc = 1-->Xarelto (later changed to warfarin); c. 05/2018 recurrent afib-->Amio initiated.  . Sleep apnea    Past Surgical History:  Procedure Laterality Date  . CARDIOVERSION N/A 03/24/2018   Procedure: CARDIOVERSION;  Surgeon: Antonieta Iba, MD;  Location: [facility] ORS;  Service: Cardiovascular;  Laterality: N/A;  . CARDIOVERSION N/A 08/08/2018   Procedure: CARDIOVERSION;  Surgeon: [name], MD;  Location: [facility] ORS;  Service: Cardiovascular;  Laterality: N/A;  . hearnia repair     X 3-  total of two surgeries  . HERNIA REPAIR    . LEG SURGERY       Current Meds  Medication Sig  . albuterol (VENTOLIN HFA) 108 (90 Base) MCG/ACT inhaler Inhale 2 puffs into the lungs every 6 (six) hours as needed for wheezing or shortness of breath.  . allopurinol (ZYLOPRIM) 100 MG tablet Take 1 tablet (100 mg total) by mouth daily.  [name] atorvastatin (LIPITOR) 40 MG tablet TAKE 1 TABLET BY MOUTH ONCE DAILY  . colchicine 0.6 MG tablet 1 tab PO q 12 hrs as needed until gout flare  subsides, schedule on office follow up  . DULoxetine (CYMBALTA) 20 MG capsule Take 1 capsule (20 mg total) by mouth daily.  . ferrous sulfate 325 (65 FE) MG tablet Take 1 tablet (325 mg total) by mouth 2 (two) times daily with a meal.  . gabapentin (NEURONTIN) 300 MG capsule Take 1 capsule (300 mg total) by mouth 3 (three) times daily.  [name] levothyroxine (SYNTHROID) 50 MCG tablet Take 1 tablet (50 mcg total) by mouth daily before breakfast. Need lab work beginning of June for Thyroid  . lisinopril (ZESTRIL) 2.5 MG tablet Take 1 tablet (2.5 mg total) by mouth daily.  . metoprolol succinate (TOPROL-XL) 50 MG 24 hr tablet Take 1 tablet (50 mg total) by mouth 2 (two) times daily. Take with or immediately following a meal.  . nitroGLYCERIN (NITROSTAT) 0.4 MG SL tablet Place 1 tablet under tongue every 5 minutes as needed for chest pain. (No more than 3 doses within 15 minutes)  . torsemide (DEMADEX) 20 MG tablet Take 3 tablets (60 mg total) by mouth 2 (two) times daily.  . traZODone (DESYREL) 150 MG tablet TAKE 1 TABLET BY MOUTH AT BEDTIME AS NEEDED SLEEP  . warfarin (COUMADIN) 5 MG tablet Take by mouth daily as directed by the anti-coag clinic    Allergies: Patient has no known allergies.  Social History   Tobacco Use  . Smoking status: Never Smoker  . Smokeless tobacco: Never Used  Substance Use Topics  . Alcohol use: Not Currently  . Drug use: Never    Family History  Problem Relation Age of Onset  . Heart failure Mother   . Lung cancer Mother   . Lung cancer Father   . Heart attack Maternal Grandmother   . Heart attack Maternal Grandfather     Review of Systems: A 12-system review of systems was performed and was negative except as noted in the HPI.  --------------------------------------------------------------------------------------------------  Physical Exam: BP 100/66 (BP Location: Right Wrist, Patient Position: Sitting, Cuff Size: Normal)   Pulse 71   Ht 5\' 8"  (1.727 m)    Wt (!) 402 lb (182.3 kg) Comment: at home weight.  SpO2 98%   BMI 61.12 kg/m   General: Morbidly obese man, seated in a wheelchair. Neck: No obvious JVD, though body habitus limits evaluation. Lungs: Mildly diminished breath sounds throughout without wheezes or crackles. Heart: Distant heart sounds: Irregularly irregular without murmurs. Abdomen: Obese and firm. Extremities: 2+ woody edema in both legs.  Both distal calves are wrapped with gauze.  EKG: Atrial fibrillation (ventricular rate 71) with left bundle branch block.  Lab Results  Component Value Date   WBC 5.6 05/02/2019   HGB 12.7 (L) 05/02/2019   HCT 40.0 05/02/2019   MCV 86 05/02/2019   PLT 276 05/02/2019    Lab Results  Component Value Date   NA 141 05/02/2019   K 4.6 05/02/2019   CL 98  05/02/2019   CO2 28 05/02/2019   BUN 17 05/02/2019   CREATININE 1.13 05/02/2019   GLUCOSE 92 05/02/2019   ALT 11 05/02/2019    Lab Results  Component Value Date   CHOL 166 05/02/2019   HDL 70 05/02/2019   LDLCALC 75 05/02/2019   TRIG 122 05/02/2019   CHOLHDL 3.2 01/22/2019    --------------------------------------------------------------------------------------------------  ASSESSMENT AND PLAN: Persistent atrial fibrillation: Mr. Fantroy remains in atrial fibrillation with reasonable ventricular rate control 71 bpm.  Given some dizziness and borderline low blood pressure today, I have recommended decreasing metoprolol succinate to 50 mg daily.  For now, we will plan to continue warfarin, though I am very nervous about indefinite warfarin use in the setting of poor follow-up and marked fluctuations in anticoagulation.  We will try to arrange for home health to assist with routine INR monitoring.  Unfortunately, if this cannot be arranged and Mr. Muccio does not follow-up consistently for warfarin monitoring, the risks of long-term anticoagulation may outweigh benefits.  Chronic systolic and diastolic heart failure: Mr.  Albrecht has continued NYHA class III-4 heart failure symptoms.  He has chronic lower extremity edema that is likely multifactorial.  His weight is actually down from prior visits.  As such, I will check a BMP today with plans to continue his current diuretic regimen of torsemide 60 mg twice daily.  As noted above, I will de-escalate metoprolol succinate to 50 mg once daily in the setting of soft blood pressure and an occasional dizziness.  We will hopefully be able to continue lisinopril 2.5 mg daily.  I do not think spironolactone is a good option right now due to poor follow-up.  History of pulmonary embolism: This occurred in the setting of NOAC use, prompting switch to warfarin.  Unfortunately, anticoagulation has been problematic as outlined above.  Venipuncture INR is pending.  Morbid obesity: BMI remains greater than 60.  Mr. Monsanto notes that his mobility is limited by multiple factors, including his shortness of breath and hip pain.  He was told that he needs to lose at least 40 pounds before hip replacement would be considered.  I encouraged him to begin working on this through lifestyle modifications, though he requests liposuction instead.  Follow-up: Return to clinic in 1 month.  [name], MD 06/06/2019 5:01 PM

## 2019-06-06 NOTE — Chronic Care Management (AMB) (Signed)
  Chronic Care Management   Social Work Note  06/06/2019 Name: [name] [name] MRN: [id number] DOB: [birth year]  [name] [name] is a 59 y.o. year old male who sees Flinchum, [name], FNP for primary care. The CCM team was consulted for assistance with [name] [name] .   Phone call from [name] from the Disabilities Advocacy group  introducing herself as being assigned to patient's  Case and requesting demographic information on patient so that he can be added to their system so that assistance can be provided for the requested wheelchair ramp.  SDOH (Social Determinants of Health) assessments performed: No     Outpatient Encounter Medications as of 06/06/2019  Medication Sig  . albuterol (VENTOLIN HFA) 108 (90 Base) MCG/ACT inhaler Inhale 2 puffs into the lungs every 6 (six) hours as needed for wheezing or shortness of breath.  . allopurinol (ZYLOPRIM) 100 MG tablet Take 1 tablet (100 mg total) by mouth daily.  [name] atorvastatin (LIPITOR) 40 MG tablet TAKE 1 TABLET BY MOUTH ONCE DAILY  . colchicine 0.6 MG tablet 1 tab PO q 12 hrs as needed until gout flare subsides, schedule on office follow up  . DULoxetine (CYMBALTA) 20 MG capsule Take 1 capsule (20 mg total) by mouth daily.  . ferrous sulfate 325 (65 FE) MG tablet Take 1 tablet (325 mg total) by mouth 2 (two) times daily with a meal.  . gabapentin (NEURONTIN) 300 MG capsule Take 1 capsule (300 mg total) by mouth 3 (three) times daily.  [name] levothyroxine (SYNTHROID) 50 MCG tablet Take 1 tablet (50 mcg total) by mouth daily before breakfast. Need lab work beginning of June for Thyroid  . lisinopril (ZESTRIL) 2.5 MG tablet Take 1 tablet (2.5 mg total) by mouth daily.  . metoprolol succinate (TOPROL-XL) 50 MG 24 hr tablet Take 1 tablet (50 mg total) by mouth 2 (two) times daily. Take with or immediately following a meal.  . nitroGLYCERIN (NITROSTAT) 0.4 MG SL tablet Place 1 tablet under tongue every 5 minutes as needed for chest pain. (No  more than 3 doses within 15 minutes)  . torsemide (DEMADEX) 20 MG tablet Take 3 tablets (60 mg total) by mouth 2 (two) times daily.  . traZODone (DESYREL) 150 MG tablet TAKE 1 TABLET BY MOUTH AT BEDTIME AS NEEDED SLEEP  . warfarin (COUMADIN) 5 MG tablet Take by mouth daily as directed by the anti-coag clinic   No facility-administered encounter medications on file as of 06/06/2019.    Goals Addressed   None     Follow Up Plan: SW will follow up with patient to obtain permission to forward his information to the advocacy group for the wheelchair lift.   Verna Czech, LCSW Clinical Social Worker  [facility] Care Management [phone]

## 2019-06-06 NOTE — Patient Instructions (Addendum)
Thank you allowing the Chronic Care Management Team to be a part of your care! It was a pleasure speaking with you today!  1. Please anticipate a call from the advocacy group for assistance with your wheelchair lift request  CCM (Chronic Care Management) Team   Juanell Fairly RN, BSN Nurse Care Coordinator  [phone]  Gloriajean Okun [address] LCSW Clinical Social Worker [phone]  Goals Addressed            This Visit's Progress   . 'I need a wheelchair lift for my minivan" (pt-stated)       CARE PLAN ENTRY (see longitudinal plan of care for additional care plan information)  Current Barriers:  . Financial constraints related to need for a wheelchair lift for his car  Clinical Social Work Clinical Goal(s):  [name] Over the next 90 days, patient will work with the disability advocacy group to address needs related to need for a wheelchair lift  Interventions: . Inter-disciplinary care team collaboration (see longitudinal plan of care) . Collaborated with the disability advocacy group (community agency) re: need for a wheelchair lift for patient's car . Contacted patient, referral re-introduced obtained consent to provide patient's contact information to be added to their data base for assistance . Collaboration phone call back to the advocacy group, [name] requested information provided . Confirmed that [name] will begin search for the requested lift  Patient Self Care Activities:  . Patient verbalizes understanding of plan to follow up with the advocacy group for assistance for a wheelchair lift . Lack of knowledge of available resources to obtain a wheelchair lift  Initial goal documentation         The patient verbalized understanding of instructions provided today and declined a print copy of patient instruction materials.   Telephone follow up appointment with care management team member scheduled for: 06/15/19

## 2019-06-06 NOTE — Telephone Encounter (Signed)
Patient has been advised he states home health nurse came out for evaluation this morning. KW

## 2019-06-06 NOTE — Chronic Care Management (AMB) (Signed)
Care Management    Clinical Social Work Follow Up Note  06/06/2019 Name: [name] [name] MRN: [id number] DOB: [birth year]  [name] [name] is a 59 y.o. year old male who is a primary care patient of Flinchum, Kelby Aline, FNP. The CCM team was consulted for assistance with [name] .   Review of patient status, including review of consultants reports, other relevant assessments, and collaboration with appropriate care team members and the patient's provider was performed as part of comprehensive patient evaluation and provision of chronic care management services.    SDOH (Social Determinants of Health) assessments performed: No    Outpatient Encounter Medications as of 06/06/2019  Medication Sig  . albuterol (VENTOLIN HFA) 108 (90 Base) MCG/ACT inhaler Inhale 2 puffs into the lungs every 6 (six) hours as needed for wheezing or shortness of breath.  . allopurinol (ZYLOPRIM) 100 MG tablet Take 1 tablet (100 mg total) by mouth daily.  [name] atorvastatin (LIPITOR) 40 MG tablet TAKE 1 TABLET BY MOUTH ONCE DAILY  . colchicine 0.6 MG tablet 1 tab PO q 12 hrs as needed until gout flare subsides, schedule on office follow up  . DULoxetine (CYMBALTA) 20 MG capsule Take 1 capsule (20 mg total) by mouth daily.  . ferrous sulfate 325 (65 FE) MG tablet Take 1 tablet (325 mg total) by mouth 2 (two) times daily with a meal.  . gabapentin (NEURONTIN) 300 MG capsule Take 1 capsule (300 mg total) by mouth 3 (three) times daily.  [name] levothyroxine (SYNTHROID) 50 MCG tablet Take 1 tablet (50 mcg total) by mouth daily before breakfast. Need lab work beginning of June for Thyroid  . lisinopril (ZESTRIL) 2.5 MG tablet Take 1 tablet (2.5 mg total) by mouth daily.  . metoprolol succinate (TOPROL-XL) 50 MG 24 hr tablet Take 1 tablet (50 mg total) by mouth 2 (two) times daily. Take with or immediately following a meal.  . nitroGLYCERIN (NITROSTAT) 0.4 MG SL tablet Place 1 tablet under tongue every 5 minutes as  needed for chest pain. (No more than 3 doses within 15 minutes)  . torsemide (DEMADEX) 20 MG tablet Take 3 tablets (60 mg total) by mouth 2 (two) times daily.  . traZODone (DESYREL) 150 MG tablet TAKE 1 TABLET BY MOUTH AT BEDTIME AS NEEDED SLEEP  . warfarin (COUMADIN) 5 MG tablet Take by mouth daily as directed by the anti-coag clinic   No facility-administered encounter medications on file as of 06/06/2019.     Goals Addressed            This Visit's Progress   . 'I need a wheelchair lift for my minivan" (pt-stated)       [place] (see longitudinal plan of care for additional care plan information)  Current Barriers:  . Financial constraints related to need for a wheelchair lift for his car  Clinical Social Work Clinical Goal(s):  [name] Over the next 90 days, patient will work with the disability advocacy group to address needs related to need for a wheelchair lift  Interventions: . Inter-disciplinary care team collaboration (see longitudinal plan of care) . Collaborated with the disability advocacy group (community agency) re: need for a wheelchair lift for patient's car . Contacted patient, referral re-introduced obtained consent to provide patient's contact information to be added to their data base for assistance . Collaboration phone call back to the advocacy group, Natale Milch requested information provided . Confirmed that Natale Milch will begin search for the requested lift  Patient Self  Care Activities:  . Patient verbalizes understanding of plan to follow up with the advocacy group for assistance for a wheelchair lift . Lack of knowledge of available resources to obtain a wheelchair lift  Initial goal documentation         Follow Up Plan: SW will follow up with patient by phone over the next 2-3 weeks    [place]aly Warriner, LCSW Clinical Social Worker  [facility] Care Management [phone]

## 2019-06-06 NOTE — Patient Instructions (Addendum)
Referral to home health for wounds and INR checks.  Medication Instructions:  Your physician has recommended you make the following change in your medication:  1- DECREASE Metoprolol to 50 mg by mouth once a day.  *If you need a refill on your cardiac medications before your next appointment, please call your pharmacy*  Follow-Up: At [facility], you and your health needs are our priority.  As part of our continuing mission to provide you with exceptional heart care, we have created designated Provider Care Teams.  These Care Teams include your primary Cardiologist (physician) and Advanced Practice Providers (APPs -  Physician Assistants and Nurse Practitioners) who all work together to provide you with the care you need, when you need it.  We recommend signing up for the patient portal called "MyChart".  Sign up information is provided on this After Visit Summary.  MyChart is used to connect with patients for Virtual Visits (Telemedicine).  Patients are able to view lab/test results, encounter notes, upcoming appointments, etc.  Non-urgent messages can be sent to your provider as well.   To learn more about what you can do with MyChart, go to [email or web address].    Your next appointment:   1 month(s)  The format for your next appointment:   In Person  Provider:    You may see [name], MD or one of the following Advanced Practice Providers on your designated Care Team:    Nicolasa Ducking, NP  [name], PA-C  Marisue Ivan, PA-C

## 2019-06-07 ENCOUNTER — Telehealth: Payer: Self-pay | Admitting: Cardiology

## 2019-06-07 ENCOUNTER — Encounter: Payer: Self-pay | Admitting: Internal Medicine

## 2019-06-07 LAB — PROTIME-INR
INR: >10 (ref 0.9–1.2)
Prothrombin Time: >90 s — ABNORMAL HIGH (ref 9.1–12.0)

## 2019-06-07 LAB — CBC WITH DIFFERENTIAL/PLATELET
Basophils Absolute: 0.1 10*3/uL (ref 0.0–0.2)
Basos: 1 %
EOS (ABSOLUTE): 0.3 10*3/uL (ref 0.0–0.4)
Eos: 5 %
Hematocrit: 39.7 % (ref 37.5–51.0)
Hemoglobin: 12.6 g/dL — ABNORMAL LOW (ref 13.0–17.7)
Immature Grans (Abs): 0 10*3/uL (ref 0.0–0.1)
Immature Granulocytes: 0 %
Lymphocytes Absolute: 1.4 10*3/uL (ref 0.7–3.1)
Lymphs: 21 %
MCH: 27 pg (ref 26.6–33.0)
MCHC: 31.7 g/dL (ref 31.5–35.7)
MCV: 85 fL (ref 79–97)
Monocytes Absolute: 0.8 10*3/uL (ref 0.1–0.9)
Monocytes: 13 %
Neutrophils Absolute: 3.9 10*3/uL (ref 1.4–7.0)
Neutrophils: 60 %
Platelets: 382 10*3/uL (ref 150–450)
RBC: 4.67 x10E6/uL (ref 4.14–5.80)
RDW: 16.8 % — ABNORMAL HIGH (ref 11.6–15.4)
WBC: 6.5 10*3/uL (ref 3.4–10.8)

## 2019-06-07 LAB — BASIC METABOLIC PANEL
BUN/Creatinine Ratio: 9 (ref 9–20)
BUN: 9 mg/dL (ref 6–24)
CO2: 29 mmol/L (ref 20–29)
Calcium: 8.7 mg/dL (ref 8.7–10.2)
Chloride: 94 mmol/L — ABNORMAL LOW (ref 96–106)
Creatinine, Ser: 0.99 mg/dL (ref 0.76–1.27)
GFR calc Af Amer: 97 mL/min/{1.73_m2} (ref >59)
GFR calc non Af Amer: 84 mL/min/{1.73_m2} (ref >59)
Glucose: 108 mg/dL — ABNORMAL HIGH (ref 65–99)
Potassium: 3.7 mmol/L (ref 3.5–5.2)
Sodium: 139 mmol/L (ref 134–144)

## 2019-06-07 NOTE — Telephone Encounter (Signed)
I recommend that warfarin be held until [name] returns for follow-up with the anticoagulation clinic.  If he is unable to be compliant with follow-up and home health is unable to be arranged, I recommend that warfarin not be restarted, as the risks outweigh benefits.  Ultimately, if he is unable to maintain a healthy living situation at home, he may need to consider transitioning  to assisted living/SNF.  [name], MD [facility]

## 2019-06-07 NOTE — Telephone Encounter (Signed)
Called spoke with pt, advised pt his INR was greater than 10 via venipuncture yesterday in the [place] office.  Advised pt per Dr Serita Kyle recommendations he needs to go to the emergency room to be evaluated and assess if anticoagulation needs to be reversed.  Pt states "he just doesn't give a damn", his hip needs to be replaced and he is in so much pain that it is difficult for him to get out and go anywhere, so he is refusing to go to the ED.  Advised pt of risks associated with supratherapeutic INR, including a life threatening bleed and pt states he is just going to sit there at home and take his chances.  I advised pt to call 911 or go to the ED with bleeding of any sort and to continue to hold his Warfarin until his appointment with [facility], RN in the Coumadin Clinic on Monday 06/11/19.  Pt states he is not going to go to that appointment in the Coumadin Clinic on Monday, again restating the extent of his hip pain and difficulty getting out of the house and to appts.  I advised pt again regarding the importance of having his INR monitored very closely given his extremely elevated INR yesterday, but pt is refusing to follow-up in the clinic on Monday.  Advised pt we cannot safely instruct him on when to restart taking his Coumadin without first checking his INR.  Advised pt I would forward this information to Dr End and his nurse Victorino Dike to let them know and have them reach out to pt with further recommendations.

## 2019-06-07 NOTE — Telephone Encounter (Signed)
Please note INR  >10

## 2019-06-07 NOTE — Telephone Encounter (Signed)
Just fyi on mR [place]  See telephone calls

## 2019-06-07 NOTE — Telephone Encounter (Signed)
Attempted to call [name] with Dr Serita Kyle recommendations to go to ED.  LMOM TCB for instructions.

## 2019-06-07 NOTE — Telephone Encounter (Signed)
According to Epic, looks like PCP has began process with [facility]. Called [facility] to inquire if INR checks could be added to the home health regimen.  His nurse Rinaldo Cloud was not available at the moment. Representative took my information and will call back to discuss possibility of adding INR checks/maintenance to home health.

## 2019-06-07 NOTE — Telephone Encounter (Signed)
Please let Mr. Froh know that his INR is greater than 10.  I again recommend that he go to the ER for further evaluation and management, as he is at risk for life-threatening bleeding.  If he refuses, as he has done multiple times in the past, he should continue to hold warfarin and follow-up with the anticoagulation clinic for ongoing management of his warfarin.  If he cannot reliably come in for INR checks and  we are unable to arrange for home health INR checks, I think anticoagulation will need to be discontinued despite atrial fibrillation and history of PE, as the risks of poorly managed therapy in the setting of noncompliance with follow-up outweigh benefits.  [name], MD [facility]

## 2019-06-08 ENCOUNTER — Encounter: Payer: Medicaid Other | Admitting: Internal Medicine

## 2019-06-08 ENCOUNTER — Other Ambulatory Visit: Payer: Self-pay

## 2019-06-08 DIAGNOSIS — Z86711 Personal history of pulmonary embolism: Secondary | ICD-10-CM | POA: Diagnosis not present

## 2019-06-08 DIAGNOSIS — L97812 Non-pressure chronic ulcer of other part of right lower leg with fat layer exposed: Secondary | ICD-10-CM | POA: Diagnosis not present

## 2019-06-08 DIAGNOSIS — I10 Essential (primary) hypertension: Secondary | ICD-10-CM | POA: Diagnosis not present

## 2019-06-08 DIAGNOSIS — J449 Chronic obstructive pulmonary disease, unspecified: Secondary | ICD-10-CM | POA: Diagnosis not present

## 2019-06-08 DIAGNOSIS — I480 Paroxysmal atrial fibrillation: Secondary | ICD-10-CM | POA: Diagnosis not present

## 2019-06-08 DIAGNOSIS — Z7901 Long term (current) use of anticoagulants: Secondary | ICD-10-CM | POA: Diagnosis not present

## 2019-06-08 DIAGNOSIS — E11622 Type 2 diabetes mellitus with other skin ulcer: Secondary | ICD-10-CM | POA: Diagnosis not present

## 2019-06-08 DIAGNOSIS — I5042 Chronic combined systolic (congestive) and diastolic (congestive) heart failure: Secondary | ICD-10-CM | POA: Diagnosis not present

## 2019-06-08 DIAGNOSIS — I110 Hypertensive heart disease with heart failure: Secondary | ICD-10-CM | POA: Diagnosis not present

## 2019-06-08 DIAGNOSIS — Z6841 Body Mass Index (BMI) 40.0 and over, adult: Secondary | ICD-10-CM | POA: Diagnosis not present

## 2019-06-08 DIAGNOSIS — I890 Lymphedema, not elsewhere classified: Secondary | ICD-10-CM | POA: Diagnosis not present

## 2019-06-08 DIAGNOSIS — L97822 Non-pressure chronic ulcer of other part of left lower leg with fat layer exposed: Secondary | ICD-10-CM | POA: Diagnosis not present

## 2019-06-08 DIAGNOSIS — I872 Venous insufficiency (chronic) (peripheral): Secondary | ICD-10-CM | POA: Diagnosis not present

## 2019-06-08 DIAGNOSIS — I509 Heart failure, unspecified: Secondary | ICD-10-CM | POA: Diagnosis not present

## 2019-06-08 NOTE — Progress Notes (Signed)
ETHRIDGE, SOLLENBERGER ([id number]) Visit Report for 06/08/2019 Arrival Information Details Patient Name: [name] [name], [name] [name] Date of Service: 06/08/2019 9:15 AM Medical Record Number: [id number] Patient Account Number: [id number] Date of Birth/Sex: Aug 17, 1960 (59 y.o. M) Treating RN: Rodell Perna Primary Care Gertie Broerman: Marvell Fuller Other Clinician: Referring Delanda Bulluck: Marvell Fuller Treating Nikola Marone/Extender: Altamese [place] in Treatment: 5 Visit Information History Since Last Visit Added or deleted any medications: No Patient Arrived: Wheel Chair Any new allergies or adverse reactions: No Arrival Time: 09:10 Had a fall or experienced change in No Accompanied By: self activities of daily living that may affect Transfer Assistance: None risk of falls: Patient Identification Verified: Yes Signs or symptoms of abuse/neglect since last visito No Patient Has Alerts: Yes Hospitalized since last visit: No Patient Alerts: Patient on Blood Thinner Has Dressing in Place as Prescribed: Yes Warfarin Pain Present Now: Yes Electronic Signature(s) Signed: 06/08/2019 11:45:23 AM By: Rodell Perna Entered By: Rodell Perna on 06/08/2019 09:10:26 [name] [name] ([id number]) -------------------------------------------------------------------------------- Compression Therapy Details Patient Name: [name] [name] Date of Service: 06/08/2019 9:15 AM Medical Record Number: [id number] Patient Account Number: [id number] Date of Birth/Sex: October 01, 1960 (59 y.o. M) Treating RN: Curtis Sites Primary Care Mariavictoria Nottingham: Marvell Fuller Other Clinician: Referring Jeramyah Goodpasture: Marvell Fuller Treating Zauria Dombek/Extender: Altamese [place] in Treatment: 5 Compression Therapy Performed for Wound Assessment: Wound #3 Right,Proximal,Lateral Lower Leg Performed By: Clinician Curtis Sites, RN Compression Type: Henriette Combs Post Procedure Diagnosis Same as Pre-procedure Electronic  Signature(s) Signed: 06/08/2019 12:04:24 PM By: Curtis Sites Entered By: Curtis Sites on 06/08/2019 09:41:46 [name] [name] ([id number]) -------------------------------------------------------------------------------- Compression Therapy Details Patient Name: [name] [name] Date of Service: 06/08/2019 9:15 AM Medical Record Number: [id number] Patient Account Number: [id number] Date of Birth/Sex: May 24, 1960 (59 y.o. M) Treating RN: Curtis Sites Primary Care Nelsy Madonna: Marvell Fuller Other Clinician: Referring Babbette Dalesandro: Marvell Fuller Treating Chanoch Mccleery/Extender: Altamese [place] in Treatment: 5 Compression Therapy Performed for Wound Assessment: Wound #5 Left,Lateral Lower Leg Performed By: Clinician Curtis Sites, RN Compression Type: Henriette Combs Post Procedure Diagnosis Same as Pre-procedure Electronic Signature(s) Signed: 06/08/2019 12:04:24 PM By: Curtis Sites Entered By: Curtis Sites on 06/08/2019 09:41:46 [name] [name] ([id number]) -------------------------------------------------------------------------------- Compression Therapy Details Patient Name: [name] [name] Date of Service: 06/08/2019 9:15 AM Medical Record Number: [id number] Patient Account Number: [id number] Date of Birth/Sex: 03/28/1960 (59 y.o. M) Treating RN: Curtis Sites Primary Care Miliani Deike: Marvell Fuller Other Clinician: Referring Arlette Schaad: Marvell Fuller Treating Theran Vandergrift/Extender: Altamese [place] in Treatment: 5 Compression Therapy Performed for Wound Assessment: Wound #6 Left,Posterior Lower Leg Performed By: Clinician Curtis Sites, RN Compression Type: Henriette Combs Post Procedure Diagnosis Same as Pre-procedure Electronic Signature(s) Signed: 06/08/2019 12:04:24 PM By: Curtis Sites Entered By: Curtis Sites on 06/08/2019 09:41:46 [name] [name] ([id number]) -------------------------------------------------------------------------------- Compression  Therapy Details Patient Name: [name] [name] Date of Service: 06/08/2019 9:15 AM Medical Record Number: [id number] Patient Account Number: [id number] Date of Birth/Sex: 19-Jun-1960 (59 y.o. M) Treating RN: Curtis Sites Primary Care Marcel Sorter: Marvell Fuller Other Clinician: Referring Lyric Rossano: Marvell Fuller Treating Anastazja Isaac/Extender: Altamese [place] in Treatment: 5 Compression Therapy Performed for Wound Assessment: Wound #7 Right,Anterior Lower Leg Performed By: Clinician Curtis Sites, RN Compression Type: Henriette Combs Post Procedure Diagnosis Same as Pre-procedure Electronic Signature(s) Signed: 06/08/2019 12:04:24 PM By: Curtis Sites Entered By: Curtis Sites on 06/08/2019 09:41:46 [name] [name] ([id number]) -------------------------------------------------------------------------------- Encounter Discharge Information Details Patient Name: [name] [name] Date of Service: 06/08/2019 9:15 AM Medical Record Number: [id number] Patient Account Number: [id number] Date of Birth/Sex: [birth year] (59 y.o. M) Treating RN: Curtis Sites  Primary Care Lodema Parma: Marvell Fuller Other Clinician: Referring Earsel Shouse: Marvell Fuller Treating Farhan Jean/Extender: Altamese [place] in Treatment: 5 Encounter Discharge Information Items Post Procedure Vitals Discharge Condition: Stable Temperature (F): 99.7 Ambulatory Status: Wheelchair Pulse (bpm): 78 Discharge Destination: Home Respiratory Rate (breaths/min): 20 Transportation: Private Auto Blood Pressure (mmHg): 98/57 Accompanied By: self Schedule Follow-up Appointment: Yes Clinical Summary of Care: Electronic Signature(s) Signed: 06/08/2019 12:04:24 PM By: Curtis Sites Entered By: Curtis Sites on 06/08/2019 09:44:03 [name] [name] ([id number]) -------------------------------------------------------------------------------- Lower Extremity Assessment Details Patient Name: [name] [name] Date of  Service: 06/08/2019 9:15 AM Medical Record Number: [id number] Patient Account Number: [id number] Date of Birth/Sex: [birth year] (59 y.o. M) Treating RN: Rodell Perna Primary Care Aiesha Leland: Marvell Fuller Other Clinician: Referring Zareya Tuckett: Marvell Fuller Treating Keishawna Carranza/Extender: Altamese [place] in Treatment: 5 Edema Assessment Assessed: [Left: No] [Right: No] Edema: [Left: Yes] [Right: Yes] Calf Left: Right: Point of Measurement: 31 cm From Medial Instep 60 cm 59 cm Ankle Left: Right: Point of Measurement: 13 cm From Medial Instep 36 cm 34 cm Vascular Assessment Pulses: Dorsalis Pedis Palpable: [Left:Yes] [Right:Yes] Electronic Signature(s) Signed: 06/08/2019 11:45:23 AM By: Rodell Perna Entered By: Rodell Perna on 06/08/2019 09:23:17 [name] [name] ([id number]) -------------------------------------------------------------------------------- Multi Wound Chart Details Patient Name: [name] [name] Date of Service: 06/08/2019 9:15 AM Medical Record Number: [id number] Patient Account Number: [id number] Date of Birth/Sex: 02-Oct-1960 (59 y.o. 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 Comorbid History: Asthma, Chronic Obstructive Asthma, Chronic Obstructive Asthma, Chronic Obstructive Pulmonary Disease (COPD), Sleep Pulmonary Disease (COPD), Sleep Pulmonary Disease (COPD),  Sleep Apnea, Arrhythmia, Congestive Apnea, Arrhythmia, Congestive Apnea, Arrhythmia, Congestive Heart Failure, Hypertension, Heart Failure, Hypertension, Heart Failure, Hypertension, Myocardial Infarction, Type II Myocardial Infarction, Type II Myocardial Infarction, Type II Diabetes Diabetes Diabetes Date Acquired: 02/16/2019 02/12/2019 06/01/2019 Weeks of Treatment: 5 5 1  Wound Status: Open Open Open Clustered Wound: No Yes No Clustered Quantity: N/A 2 N/A Measurements L x W x D (cm) 0.5x0.5x0.1 10x10x0.1 0.5x4x0.1 Area (cm) : 0.196 78.54 1.571 Volume (cm) : 0.02 7.854 0.157 % Reduction in Area: 48.00% -296.80% 95.00% % Reduction in Volume: 47.40% -296.90% 95.00% Classification: Grade 1 Full Thickness Without Exposed Full Thickness Without Exposed Support Structures Support Structures Exudate Amount: Medium Medium Large Exudate Type: Serous Serous Serous Exudate Color: amber amber amber Wound Margin: Flat and Intact Flat and Intact Indistinct, nonvisible Granulation Amount: Medium (34-66%) Medium (34-66%) Medium (34-66%) Granulation Quality: Pink Pink Pink Necrotic Amount: Medium (34-66%) Medium (34-66%) Medium (34-66%) Exposed Structures: Fat Layer (Subcutaneous Tissue) Fat Layer (Subcutaneous Tissue) Fat Layer (Subcutaneous Tissue) Exposed: Yes Exposed: Yes Exposed: Yes Fascia: No Fascia: No Fascia: No Tendon: No Tendon: No Tendon: No Muscle: No Muscle: No Muscle: No Joint: No Joint: No Joint: No Bone: No Bone: No Bone: No Epithelialization: Small (1-33%) None Medium (34-66%) Debridement: N/A Debridement - Excisional N/A DAVARIAN, LOHSE ([id number]) Pre-procedure Verification/Time N/A 09:40 N/A Out Taken: Pain Control: N/A Lidocaine 4% Topical Solution N/A Tissue Debrided: N/A Subcutaneous, Slough N/A Level: N/A Skin/Subcutaneous Tissue N/A Debridement Area (sq cm): N/A 100 N/A Instrument: N/A Curette N/A Bleeding: N/A Minimum  N/A Hemostasis Achieved: N/A  Pressure N/A Procedural Pain: N/A 0 N/A Post Procedural Pain: N/A 0 N/A Debridement Treatment N/A Procedure was tolerated well N/A Response: Post Debridement Measurements N/A 10x10x0.2 N/A L x W x D (cm) Post Debridement Volume: (cm) N/A 15.708 N/A Procedures Performed: Compression Therapy Compression Therapy Compression Therapy Debridement Wound Number: 7 N/A N/A Photos: N/A N/A Wound Location: Right, Anterior Lower Leg N/A N/A Wounding Event: Gradually Appeared N/A N/A Primary Etiology: Venous Leg Ulcer N/A N/A Comorbid History: Asthma, Chronic Obstructive N/A N/A Pulmonary Disease (COPD), Sleep Apnea, Arrhythmia, Congestive Heart Failure, Hypertension, Myocardial Infarction, Type II Diabetes Date Acquired: 05/17/2019 N/A N/A Weeks of Treatment: 0 N/A N/A Wound Status: Open N/A N/A Clustered Wound: No N/A N/A Clustered Quantity: N/A N/A N/A Measurements L x W x D (cm) 7x14x0.2 N/A N/A Area (cm) : 76.969 N/A N/A Volume (cm) : 15.394 N/A N/A % Reduction in Area: N/A N/A N/A % Reduction in Volume: N/A N/A N/A Classification: Full Thickness Without Exposed N/A N/A Support Structures Exudate Amount: Medium N/A N/A Exudate Type: Serosanguineous N/A N/A Exudate Color: red, brown N/A N/A Wound Margin: N/A N/A N/A Granulation Amount: Small (1-33%) N/A N/A Granulation Quality: Pink N/A N/A Necrotic Amount: Large (67-100%) N/A N/A Exposed Structures: Fat Layer (Subcutaneous Tissue) N/A N/A Exposed: Yes Fascia: No Tendon: No Muscle: No Joint: No Bone: No Epithelialization: None N/A N/A Debridement: Debridement - Excisional N/A N/A 09:38 N/A N/A MORDCHE, HEDGLIN ([id number]) Pre-procedure Verification/Time Out Taken: Pain Control: Lidocaine 4% Topical Solution N/A N/A Tissue Debrided: Subcutaneous, Slough N/A N/A Level: Skin/Subcutaneous Tissue N/A N/A Debridement Area (sq cm): 25 N/A N/A Instrument: Curette N/A N/A Bleeding: Minimum N/A N/A Hemostasis Achieved:  Pressure N/A N/A Procedural Pain: 0 N/A N/A Post Procedural Pain: 0 N/A N/A Debridement Treatment Procedure was tolerated well N/A N/A Response: Post Debridement Measurements 7x14x0.2 N/A N/A L x W x D (cm) Post Debridement Volume: (cm) 15.394 N/A N/A Procedures Performed: Compression Therapy N/A N/A Debridement Treatment Notes Wound #3 (Right, Proximal, Lateral Lower Leg) Notes silvercel, abd, unna boots bilateral Wound #5 (Left, Lateral Lower Leg) Notes silvercel, abd, unna boots bilateral Wound #6 (Left, Posterior Lower Leg) Notes silvercel, abd, unna boots bilateral Wound #7 (Right, Anterior Lower Leg) Notes silvercel, abd, unna boots bilateral Electronic Signature(s) Signed: 06/08/2019 11:57:48 AM By: Linton Ham MD Entered By: Linton Ham on 06/08/2019 09:45:12 [name] ([id number]) -------------------------------------------------------------------------------- Multi-Disciplinary Care Plan Details Patient Name: [name] Date of Service: 06/08/2019 9:15 AM Medical Record Number: [id number] Patient Account Number: [id number] Date of Birth/Sex: Oct 21, 1960 (59 y.o. M) Treating RN: Montey Hora Primary Care Berdie Malter: Laverna Peace Other Clinician: Referring Makhia Vosler: Laverna Peace Treating Seleen Walter/Extender: Tito Dine in Treatment: 5 Active Inactive Abuse / Safety / Falls / Self Care Management Nursing Diagnoses: Potential for falls Goals: Patient will remain injury free related to falls Date Initiated: 05/04/2019 Target Resolution Date: 07/21/2019 Goal Status: Active Interventions: Assess fall risk on admission and as needed Provide education on basic hygiene Notes: Nutrition Nursing Diagnoses: Impaired glucose control: actual or potential Goals: Patient/caregiver agrees to and verbalizes understanding of need to use nutritional supplements and/or vitamins as prescribed Date Initiated: 05/04/2019 Target Resolution  Date: 07/21/2019 Goal Status: Active Interventions: Assess patient nutrition upon admission and as needed per policy Notes: Orientation to the Wound Care Program Nursing Diagnoses: Knowledge deficit related to the wound healing center program Goals: Patient/caregiver will verbalize understanding of the [place] Date Initiated: 05/04/2019 Target Resolution Date: 07/21/2019  Goal Status: Active Interventions: Provide education on orientation to the wound center Notes: Venous Leg Ulcer Nursing Diagnoses: Potential for venous Insuffiency (use before diagnosis confirmed) DAN, SCEARCE ([id number]) Goals: Patient will maintain optimal edema control Date Initiated: 05/04/2019 Target Resolution Date: 07/21/2019 Goal Status: Active Interventions: Compression as ordered Notes: Wound/Skin Impairment Nursing Diagnoses: Impaired tissue integrity Goals: Ulcer/skin breakdown will heal within 14 weeks Date Initiated: 05/04/2019 Target Resolution Date: 07/21/2019 Goal Status: Active Interventions: Assess patient/caregiver ability to obtain necessary supplies Assess patient/caregiver ability to perform ulcer/skin care regimen upon admission and as needed Assess ulceration(s) every visit Notes: Electronic Signature(s) Signed: 06/08/2019 12:04:24 PM By: Curtis Sites Entered By: Curtis Sites on 06/08/2019 09:36:39 [name] [name] ([id number]) -------------------------------------------------------------------------------- Pain Assessment Details Patient Name: [name] [name] Date of Service: 06/08/2019 9:15 AM Medical Record Number: [id number] Patient Account Number: [id number] Date of Birth/Sex: 02-Nov-1960 (59 y.o. M) Treating RN: Rodell Perna Primary Care Justino Boze: Marvell Fuller Other Clinician: Referring Clancy Leiner: Marvell Fuller Treating Duel Conrad/Extender: Altamese [place] in Treatment: 5 Active Problems Location of Pain Severity and Description of  Pain Patient Has Paino Yes Site Locations Pain Location: Pain in Ulcers Rate the pain. Current Pain Level: 10 Pain Management and Medication Current Pain Management: Electronic Signature(s) Signed: 06/08/2019 11:45:23 AM By: Rodell Perna Entered By: Rodell Perna on 06/08/2019 09:11:37 [name] [name] ([id number]) -------------------------------------------------------------------------------- Patient/Caregiver Education Details Patient Name: [name] [name] Date of Service: 06/08/2019 9:15 AM Medical Record Number: [id number] Patient Account Number: [id number] Date of Birth/Gender: [birth year] (59 y.o. M) Treating RN: Curtis Sites Primary Care Physician: Marvell Fuller Other Clinician: Referring Physician: Marvell Fuller Treating Physician/Extender: Altamese [place] in Treatment: 5 Education Assessment Education Provided To: Patient Education Topics Provided Venous: Handouts: Other: leg elevation Methods: Explain/Verbal Responses: State content correctly Electronic Signature(s) Signed: 06/08/2019 12:04:24 PM By: Curtis Sites Entered By: Curtis Sites on 06/08/2019 09:43:12 [name] [name] ([id number]) -------------------------------------------------------------------------------- Wound Assessment Details Patient Name: [name] [name] Date of Service: 06/08/2019 9:15 AM Medical Record Number: [id number] Patient Account Number: [id number] Date of Birth/Sex: [birth year] (59 y.o. M) Treating RN: Rodell Perna Primary Care Charnelle Bergeman: Marvell Fuller Other Clinician: Referring Junaid Wurzer: Marvell Fuller Treating Porcia Morganti/Extender: Altamese [place] in Treatment: 5 Wound Status Wound Number: 3 Primary Diabetic Wound/Ulcer of the Lower Extremity Etiology: Wound Location: Right, Proximal, Lateral Lower Leg Wound Open Wounding Event: Gradually Appeared Status: Date Acquired: 02/16/2019 Comorbid Asthma, Chronic Obstructive Pulmonary Disease  (COPD), Weeks Of Treatment: 5 History: Sleep Apnea, Arrhythmia, Congestive Heart Failure, Clustered Wound: No Hypertension, Myocardial Infarction, Type II Diabetes Photos Wound Measurements Length: (cm) 0.5 Width: (cm) 0.5 Depth: (cm) 0.1 Area: (cm) 0.196 Volume: (cm) 0.02 % Reduction in Area: 48% % Reduction in Volume: 47.4% Epithelialization: Small (1-33%) Tunneling: No Undermining: No Wound Description Classification: Grade 1 Wound Margin: Flat and Intact Exudate Amount: Medium Exudate Type: Serous Exudate Color: amber Foul Odor After Cleansing: No Slough/Fibrino Yes Wound Bed Granulation Amount: Medium (34-66%) Exposed Structure Granulation Quality: Pink Fascia Exposed: No Necrotic Amount: Medium (34-66%) Fat Layer (Subcutaneous Tissue) Exposed: Yes Necrotic Quality: Adherent Slough Tendon Exposed: No Muscle Exposed: No Joint Exposed: No Bone Exposed: No Treatment Notes Wound #3 (Right, Proximal, Lateral Lower Leg) Notes silvercel, abd, unna boots bilateral Electronic Signature(sPHYLLIS, WHITEFIELD ([id number]) Signed: 06/08/2019 11:45:23 AM By: Rodell Perna Entered By: Rodell Perna on 06/08/2019 09:20:34 [name] [name] ([id number]) -------------------------------------------------------------------------------- Wound Assessment Details Patient Name: [name] [name] Date of Service: 06/08/2019 9:15 AM Medical Record Number: [id number] Patient Account Number: [id number] Date of Birth/Sex: 03-12-1960 (59 y.o. M) Treating  RN: Rodell Perna Primary Care Kaladin Noseworthy: Marvell Fuller Other Clinician: Referring Eydan Chianese: Marvell Fuller Treating Nyah Shepherd/Extender: Altamese [place] in Treatment: 5 Wound Status Wound Number: 5 Primary Lymphedema Etiology: Wound Location: Left, Lateral Lower Leg Wound Open Wounding Event: Gradually Appeared Status: Date Acquired: 02/12/2019 Comorbid Asthma, Chronic Obstructive Pulmonary Disease (COPD), Weeks Of  Treatment: 5 History: Sleep Apnea, Arrhythmia, Congestive Heart Failure, Clustered Wound: Yes Hypertension, Myocardial Infarction, Type II Diabetes Photos Wound Measurements Length: (cm) 10 Width: (cm) 10 Depth: (cm) 0.1 Clustered Quantity: 2 Area: (cm) 78.54 Volume: (cm) 7.854 % Reduction in Area: -296.8% % Reduction in Volume: -296.9% Epithelialization: None Tunneling: No Undermining: No Wound Description Classification: Full Thickness Without Exposed Support Struc Wound Margin: Flat and Intact Exudate Amount: Medium Exudate Type: Serous Exudate Color: amber tures Foul Odor After Cleansing: No Slough/Fibrino Yes Wound Bed Granulation Amount: Medium (34-66%) Exposed Structure Granulation Quality: Pink Fascia Exposed: No Necrotic Amount: Medium (34-66%) Fat Layer (Subcutaneous Tissue) Exposed: Yes Necrotic Quality: Adherent Slough Tendon Exposed: No Muscle Exposed: No Joint Exposed: No Bone Exposed: No Treatment Notes Wound #5 (Left, Lateral Lower Leg) Notes silvercel, abd, unna boots bilateral SAAFIR, ABDULLAH ([id number]) Electronic Signature(s) Signed: 06/08/2019 11:45:23 AM By: Rodell Perna Entered By: Rodell Perna on 06/08/2019 09:21:49 [name] [name] ([id number]) -------------------------------------------------------------------------------- Wound Assessment Details Patient Name: [name] [name] Date of Service: 06/08/2019 9:15 AM Medical Record Number: [id number] Patient Account Number: [id number] Date of Birth/Sex: 1960/07/11 (59 y.o. M) Treating RN: Rodell Perna Primary Care [place]field Dann: Marvell Fuller Other Clinician: Referring Noheli Melder: Marvell Fuller Treating Travon Crochet/Extender: Altamese [place] in Treatment: 5 Wound Status Wound Number: 6 Primary Lymphedema Etiology: Wound Location: Left, Posterior Lower Leg Wound Open Wounding Event: Gradually Appeared Status: Date Acquired: 06/01/2019 Comorbid Asthma, Chronic Obstructive  Pulmonary Disease (COPD), Weeks Of Treatment: 1 History: Sleep Apnea, Arrhythmia, Congestive Heart Failure, Clustered Wound: No Hypertension, Myocardial Infarction, Type II Diabetes Photos Wound Measurements Length: (cm) 0.5 Width: (cm) 4 Depth: (cm) 0.1 Area: (cm) 1.571 Volume: (cm) 0.157 % Reduction in Area: 95% % Reduction in Volume: 95% Epithelialization: Medium (34-66%) Wound Description Classification: Full Thickness Without Exposed Support Struc Wound Margin: Indistinct, nonvisible Exudate Amount: Large Exudate Type: Serous Exudate Color: amber tures Foul Odor After Cleansing: No Slough/Fibrino Yes Wound Bed Granulation Amount: Medium (34-66%) Exposed Structure Granulation Quality: Pink Fascia Exposed: No Necrotic Amount: Medium (34-66%) Fat Layer (Subcutaneous Tissue) Exposed: Yes Necrotic Quality: Adherent Slough Tendon Exposed: No Muscle Exposed: No Joint Exposed: No Bone Exposed: No Treatment Notes Wound #6 (Left, Posterior Lower Leg) Notes silvercel, abd, unna boots bilateral Electronic Signature(sALASDAIR, KLEVE ([id number]) Signed: 06/08/2019 11:45:23 AM By: Rodell Perna Entered By: Rodell Perna on 06/08/2019 09:22:05 [name] [name] ([id number]) -------------------------------------------------------------------------------- Wound Assessment Details Patient Name: [name] [name] Date of Service: 06/08/2019 9:15 AM Medical Record Number: [id number] Patient Account Number: [id number] Date of Birth/Sex: [birth year] (59 y.o. M) Treating RN: Rodell Perna Primary Care Jamarri Vuncannon: Marvell Fuller Other Clinician: Referring Talani Brazee: Marvell Fuller Treating Joy Reiger/Extender: Altamese [place] in Treatment: 5 Wound Status Wound Number: 7 Primary Venous Leg Ulcer Etiology: Wound Location: Right, Anterior Lower Leg Wound Open Wounding Event: Gradually Appeared Status: Date Acquired: 05/17/2019 Comorbid Asthma, Chronic Obstructive Pulmonary  Disease (COPD), Weeks Of Treatment: 0 History: Sleep Apnea, Arrhythmia, Congestive Heart Failure, Clustered Wound: No Hypertension, Myocardial Infarction, Type II Diabetes Photos Wound Measurements Length: (cm) 7 Width: (cm) 14 Depth: (cm) 0.2 Area: (cm) 76.969 Volume: (cm) 15.394 % Reduction in Area: % Reduction in Volume: Epithelialization: None Tunneling: No Undermining: No  Wound Description Classification: Full Thickness Without Exposed Support Struc Exudate Amount: Medium Exudate Type: Serosanguineous Exudate Color: red, brown tures Foul Odor After Cleansing: No Slough/Fibrino Yes Wound Bed Granulation Amount: Small (1-33%) Exposed Structure Granulation Quality: Pink Fascia Exposed: No Necrotic Amount: Large (67-100%) Fat Layer (Subcutaneous Tissue) Exposed: Yes Necrotic Quality: Adherent Slough Tendon Exposed: No Muscle Exposed: No Joint Exposed: No Bone Exposed: No Treatment Notes Wound #7 (Right, Anterior Lower Leg) Notes silvercel, abd, unna boots bilateral Electronic Signature(s) Signed: 06/08/2019 11:45:23 AM By: Charmayne Sheer ([id number]) Entered By: Rodell Perna on 06/08/2019 09:19:30 [name] [name] ([id number]) -------------------------------------------------------------------------------- Vitals Details Patient Name: [name] [name] Date of Service: 06/08/2019 9:15 AM Medical Record Number: [id number] Patient Account Number: [id number] Date of Birth/Sex: 09-May-1960 (59 y.o. M) Treating RN: Rodell Perna Primary Care Maudene Stotler: Marvell Fuller Other Clinician: Referring Aikam Hellickson: Marvell Fuller Treating Hitesh Fouche/Extender: Altamese [place] in Treatment: 5 Vital Signs Time Taken: 09:10 Temperature (F): 99.7 Height (in): 68 Pulse (bpm): 78 Weight (lbs): 400 Respiratory Rate (breaths/min): 16 Body Mass Index (BMI): 60.8 Blood Pressure (mmHg): 98/57 Reference Range: 80 - 120 mg / dl Electronic  Signature(s) Signed: 06/08/2019 11:45:23 AM By: Rodell Perna Entered By: Rodell Perna on 06/08/2019 09:11:23

## 2019-06-08 NOTE — Progress Notes (Signed)
GERASIMOS, PLOTTS ([id number]) Visit Report for 06/08/2019 Debridement Details Patient Name: [name] [name], [name] [name] Date of Service: 06/08/2019 9:15 AM Medical Record Number: [id number] Patient Account Number: [id number] Date of Birth/Sex: 1960/12/25 (59 y.o. M) Treating RN: Montey Hora Primary Care Provider: Laverna Peace Other Clinician: Referring Provider: Laverna Peace Treating Provider/Extender: Tito Dine in Treatment: 5 Debridement Performed for Wound #5 Left,Lateral Lower Leg Assessment: Performed By: Physician Ricard Dillon, MD Debridement Type: Debridement Level of Consciousness (Pre- Awake and Alert procedure): Pre-procedure Verification/Time Out Yes - 09:40 Taken: Start Time: 09:40 Pain Control: Lidocaine 4% Topical Solution Total Area Debrided (L x W): 10 (cm) x 10 (cm) = 100 (cm) Tissue and other material debrided: Viable, Non-Viable, Slough, Subcutaneous, Slough Level: Skin/Subcutaneous Tissue Debridement Description: Excisional Instrument: Curette Bleeding: Minimum Hemostasis Achieved: Pressure End Time: 09:42 Procedural Pain: 0 Post Procedural Pain: 0 Response to Treatment: Procedure was tolerated well Level of Consciousness (Post- Awake and Alert procedure): Post Debridement Measurements of Total Wound Length: (cm) 10 Width: (cm) 10 Depth: (cm) 0.2 Volume: (cm) 15.708 Character of Wound/Ulcer Post Debridement: Improved Post Procedure Diagnosis Same as Pre-procedure Electronic Signature(s) Signed: 06/08/2019 11:57:48 AM By: Linton Ham MD Signed: 06/08/2019 12:04:24 PM By: Montey Hora Entered By: Linton Ham on 06/08/2019 09:45:26 [name] [name] ([id number]) -------------------------------------------------------------------------------- Debridement Details Patient Name: [name] [name] Date of Service: 06/08/2019 9:15 AM Medical Record Number: [id number] Patient Account Number: [id number] Date of Birth/Sex:  08-10-1960 (59 y.o. M) Treating RN: Montey Hora Primary Care Provider: Laverna Peace Other Clinician: Referring Provider: Laverna Peace Treating Provider/Extender: Tito Dine in Treatment: 5 Debridement Performed for Wound #7 Right,Anterior Lower Leg Assessment: Performed By: Physician Ricard Dillon, MD Debridement Type: Debridement Severity of Tissue Pre Debridement: Fat layer exposed Level of Consciousness (Pre- Awake and Alert procedure): Pre-procedure Verification/Time Out Yes - 09:38 Taken: Start Time: 09:38 Pain Control: Lidocaine 4% Topical Solution Total Area Debrided (L x W): 5 (cm) x 5 (cm) = 25 (cm) Tissue and other material debrided: Viable, Non-Viable, Slough, Subcutaneous, Slough Level: Skin/Subcutaneous Tissue Debridement Description: Excisional Instrument: Curette Bleeding: Minimum Hemostasis Achieved: Pressure End Time: 09:40 Procedural Pain: 0 Post Procedural Pain: 0 Response to Treatment: Procedure was tolerated well Level of Consciousness (Post- Awake and Alert procedure): Post Debridement Measurements of Total Wound Length: (cm) 7 Width: (cm) 14 Depth: (cm) 0.2 Volume: (cm) 15.394 Character of Wound/Ulcer Post Debridement: Improved Severity of Tissue Post Debridement: Fat layer exposed Post Procedure Diagnosis Same as Pre-procedure Electronic Signature(s) Signed: 06/08/2019 11:57:48 AM By: Linton Ham MD Signed: 06/08/2019 12:04:24 PM By: Montey Hora Entered By: Linton Ham on 06/08/2019 09:45:37 [name] [name] ([id number]) -------------------------------------------------------------------------------- HPI Details Patient Name: [name] [name] Date of Service: 06/08/2019 9:15 AM Medical Record Number: [id number] Patient Account Number: [id number] Date of Birth/Sex: December 27, 1960 (59 y.o. M) Treating RN: Montey Hora Primary Care Provider: Laverna Peace Other Clinician: Referring Provider:  Laverna Peace Treating Provider/Extender: Tito Dine in Treatment: 5 History of Present Illness HPI Description: 01/18/2019 on evaluation today patient presents for initial evaluation here in our clinic concerning issues he is been having with his bilateral lower extremities. He appears based on what I am seeing to have bilateral lower extremity cellulitis along with chronic venous stasis. He has significant pitting edema even in the bottom of his foot. With that being said he also has a history of hypertension, COPD, congestive heart failure, long-term use of anticoagulants due to atrial fibrillation which is paroxysmal as well as having had a pulmonary embolism in  the past. With that being said he tells me that he has constant drainage out of his left lower extremity. This is likely due to the fact that again he has significant swelling pretty much all the time. With that being said in the past 2 weeks this has been getting worse and he tells me at this point that he feels like it is infected is also hurting a lot more than it has been in the past and the drainage is a different color. The left side is definitely worse than the right. Fortunately there is no signs of active infection systemically although I am concerned due to the severity of the infection that I see in the left lower extremity especially. There is a lot of necrotic tissue on the surface of both wounds that at some point is going to need to be cleaned off to allow these to heal but right now obviously this is much too tender for him. No fevers, chills, nausea, vomiting, or diarrhea. 02/02/2019 on evaluation today patient presents for follow-up concerning the wounds over his bilateral lower extremities. He is continuing to manage. He was in the hospital and has been discharged at this point. He was admitted from 01/21/2019 through 01/27/2019. This was due to cellulitis of lower extremity. Nonetheless he does seem to  be doing much better at this point. I did review his discharge summary as well he did not appear to have any positive findings on blood cultures. His white blood cell count was never really elevated either which is also good news. He was treated with IV clindamycin for 4 days and then switch to complete a 10-day course at home which she is still working on at this point. They did have him on IV Lasix in the hospital which helped him diurese well on discharge they stated that his weight was 408 pounds which is still higher than his dry weight of 375 to 380 pounds. Upon discharge his torsemide was increased from 40 to 60 mg twice daily and he is to follow-up with cardiology as well. 12/29-Patient returns at 2 weeks for evaluation and follow-up of his bilateral lower extremity wounds, with lymphedema, venous ulcerations, patient was recently hospitalized and was aggressively diuresed after which his torsemide has been increased. He follows up with cardiology as well. His left leg stasis wounds are more moist and have maceration of tissue, the right one is less so. We are using silver cell and unna compression given the degree of edema in the legs. Readmission: 05/04/2019 patient presents today for reevaluation here in the clinic I have not seen him since December 2020. Subsequently this is a fairly sick individual with multiple comorbidities who presents for follow-up concerning wounds of his bilateral lower extremities. He tells me that after he was in the hospital following when we saw him last in December that he ended up subsequently being discharged with home health services and they have been coming out and applying Unna boot wraps. With that being said he has used up all of his home health visits for the year and then for the last week has not had anything on his legs as far as the wrap is concerned. Obviously this is not good and he does come in today with increased swelling or at least what I  perceived to be along with somewhat dry wounds again he does not seem to be weeping too much at this point which is at least good news but he has significant  lymphedema/venous stasis. No fevers, chills, nausea, vomiting, or diarrhea. 06/01/2019 upon evaluation today patient is seen after not having been seen actually since 19 2021. He kept coming for appointment with every time he would come his temperature would be elevated past the point where we could see him. I told him that he needed to go be seen at the ER during 1 of these times due to the fact that obviously this was a fever of unknown origin at that point and my concern was that it could potentially be his legs but again I was not entirely sure as obviously I could not see them due to the restrictions secondary to Covid even bringing the patient into the clinic. Nonetheless he finally came in today his temperature again was elevated but not to the same degree that it met the 100.4 or above guidelines of having to cancel the appointment. Either way we did have a look at his legs unfortunately he appears to show signs of cellulitis at this point which is probably where the temperature elevations are emanating from. He has significant lymphedema as well as chronic vent venous stasis as well. When I initially told him that is why I told him to go to the ER previously when his temperature was elevated in case it was something with wounds or otherwise he replied "nobody ever told me to go to the ER". I then subsequently reminded him of me coming to the front desk due to his temperature being elevated and advising him to go to the ER to which she replied "I am not going to the ER and I do not think I need to even with this". Nonetheless I am concerned about the infection of his legs at this point he has significant weeping, significant edema, and overall is not doing very well in my opinion. 4/23; the patient arrives in clinic today having napkin stuck  on his wounds. He is taking the antibiotic from last week. Last Wednesday he had his INR checked at the Coumadin clinic it was apparently "sky high" and his Coumadin is now on hold. He is going back to check this again on Monday. His legs were not put back in compression last week out of fear of the extending infection with fever. He has been taking the antibiotics. Culture from last week showed Enterobacter and methicillin resistant staph aureus. He is on on doxycycline which the MRSA was sensitive to Enterobacter was not plated but given the pattern of sensitivities probably the doxycycline was satisfactory. I will extend the doxycycline another 4 days until he is seen next week. From an infection point of view this looks better Electronic Signature(s) Signed: 06/08/2019 11:57:48 AM By: Linton Ham MD Entered By: Linton Ham on 06/08/2019 09:51:09 [name] [name], [name] [name] ([id number]) [name] [name], [name] [name] ([id number]) -------------------------------------------------------------------------------- Physical Exam Details Patient Name: [name] [name] Date of Service: 06/08/2019 9:15 AM Medical Record Number: [id number] Patient Account Number: [id number] Date of Birth/Sex: [birth year] (59 y.o. M) Treating RN: Montey Hora Primary Care Provider: Laverna Peace Other Clinician: Referring Provider: Laverna Peace Treating Provider/Extender: Tito Dine in Treatment: 5 Constitutional Patient is hypotensive. However he does not appear unstable. Patient is febrile today.. Morbidly obese man but does not appear to be in any distress. Respiratory Respiratory effort is easy and symmetric bilaterally. Rate is normal at rest and on room air.. Cardiovascular Massive uncontrolled edema.. Integumentary (Hair, Skin) Surrounding erythema on the right greater than left legs. This is nontender and may represent more stasis dermatitis  than active cellulitis. Notes Wound exam oRight lateral  surrounding erythema but no overt tenderness. The wounds here actually are fully covered in necrotic eschar. I debrided this with an open curette. Hemostasis with direct pressure oLeft lateral more extensive wound areas again necrotic surface debrided with open curette I could not get this as healthy to as healthy as surface is on the right side. There is weeping edema fluid coming out of all wounds. Electronic Signature(s) Signed: 06/08/2019 11:57:48 AM By: Linton Ham MD Entered By: Linton Ham on 06/08/2019 [place], [place] ([id number]) -------------------------------------------------------------------------------- Physician Orders Details Patient Name: [name] [name] Date of Service: 06/08/2019 9:15 AM Medical Record Number: [id number] Patient Account Number: [id number] Date of Birth/Sex: 1960-06-06 (59 y.o. M) Treating RN: Montey Hora Primary Care Provider: Laverna Peace Other Clinician: Referring Provider: Laverna Peace Treating Provider/Extender: Tito Dine in Treatment: 5 Verbal / Phone Orders: No Diagnosis Coding Wound Cleansing Wound #3 Right,Proximal,Lateral Lower Leg o Cleanse wound with mild soap and water o May shower with protection. Wound #5 Left,Lateral Lower Leg o Cleanse wound with mild soap and water o May shower with protection. Wound #6 Left,Posterior Lower Leg o Cleanse wound with mild soap and water o May shower with protection. Wound #7 Right,Anterior Lower Leg o Cleanse wound with mild soap and water o May shower with protection. Primary Wound Dressing Wound #3 Right,Proximal,Lateral Lower Leg o Silver Alginate Wound #5 Left,Lateral Lower Leg o Silver Alginate Wound #6 Left,Posterior Lower Leg o Silver Alginate Wound #7 Right,Anterior Lower Leg o Silver Alginate Secondary Dressing Wound #3 Right,Proximal,Lateral Lower Leg o ABD pad o XtraSorb - where needed Wound #5  Left,Lateral Lower Leg o ABD pad o XtraSorb - where needed Wound #6 Left,Posterior Lower Leg o ABD pad o XtraSorb - where needed Wound #7 Right,Anterior Lower Leg o ABD pad o XtraSorb - where needed Dressing Change Frequency Wound #3 Right,Proximal,Lateral Lower Leg o Other: - twice weekly - Fridays and Tuesdays Wound #5 Left,Lateral Lower Leg o Other: - twice weekly - Fridays and Tuesdays Wound #6 Left,Posterior Lower Leg YEDIDYA, DUDDY ([id number]) o Other: - twice weekly - Fridays and Tuesdays Wound #7 Right,Anterior Lower Leg o Other: - twice weekly - Fridays and Tuesdays Follow-up Appointments o Return Appointment in 1 week. Edema Control Wound #3 Right,Proximal,Lateral Lower Leg o Unna Boots Bilaterally o Elevate legs to the level of the heart and pump ankles as often as possible Wound #5 Left,Lateral Lower Leg o Unna Boots Bilaterally o Elevate legs to the level of the heart and pump ankles as often as possible Wound #6 Left,Posterior Lower Leg o Unna Boots Bilaterally o Elevate legs to the level of the heart and pump ankles as often as possible Wound #7 Right,Anterior Lower Leg o Unna Boots Bilaterally o Elevate legs to the level of the heart and pump ankles as often as possible Home Health Wound #3 Right,Proximal,Lateral Lower Leg o [place] Visits o Home Health Nurse may visit PRN to address patientos wound care needs. o FACE TO FACE ENCOUNTER: MEDICARE and MEDICAID PATIENTS: I certify that this patient is under my care and that I had a face-to- face encounter that meets the physician face-to-face encounter requirements with this patient on this date. The encounter with the patient was in whole or in part for the following MEDICAL CONDITION: (primary reason for [place]) MEDICAL NECESSITY: I certify, that based on my findings, NURSING services are a medically necessary home health service. HOME BOUND  STATUS: I  certify that my clinical findings support that this patient is homebound (i.e., Due to illness or injury, pt requires aid of supportive devices such as crutches, cane, wheelchairs, walkers, the use of special transportation or the assistance of another person to leave their place of residence. There is a normal inability to leave the home and doing so requires considerable and taxing effort. Other absences are for medical reasons / religious services and are infrequent or of short duration when for other reasons). o If current dressing causes regression in wound condition, may D/C ordered dressing product/s and apply Normal Saline Moist Dressing daily until next [place] / Other MD appointment. [place] of regression in wound condition at [phone]. o Please direct any NON-WOUND related issues/requests for orders to patient's Primary Care Physician - Please get INR orders from Dr End Cardiology Wound #5 [place] Nurse may visit PRN to address patientos wound care needs. o FACE TO FACE ENCOUNTER: MEDICARE and MEDICAID PATIENTS: I certify that this patient is under my care and that I had a face-to- face encounter that meets the physician face-to-face encounter requirements with this patient on this date. The encounter with the patient was in whole or in part for the following MEDICAL CONDITION: (primary reason for [place]) MEDICAL NECESSITY: I certify, that based on my findings, NURSING services are a medically necessary home health service. HOME BOUND STATUS: I certify that my clinical findings support that this patient is homebound (i.e., Due to illness or injury, pt requires aid of supportive devices such as crutches, cane, wheelchairs, walkers, the use of special transportation or the assistance of another person to leave their place of residence. There is a normal inability to  leave the home and doing so requires considerable and taxing effort. Other absences are for medical reasons / religious services and are infrequent or of short duration when for other reasons). o If current dressing causes regression in wound condition, may D/C ordered dressing product/s and apply Normal Saline Moist Dressing daily until next [place] / Other MD appointment. [place] of regression in wound condition at [phone]. o Please direct any NON-WOUND related issues/requests for orders to patient's Primary Care Physician - Please get INR orders from Dr End Cardiology Wound #6 [place] Nurse may visit PRN to address patientos wound care needs. o FACE TO FACE ENCOUNTER: MEDICARE and MEDICAID PATIENTS: I certify that this patient is under my care and that I had a face-to- face encounter that meets the physician face-to-face encounter requirements with this patient on this date. The encounter with the patient was in whole or in part for the following MEDICAL CONDITION: (primary reason for [place]) MEDICAL NECESSITY: I certify, that based on my findings, NURSING services are a medically necessary home health service. HOME BOUND STATUS: I certify that my clinical findings support that this patient is homebound (i.e., Due to illness or injury, pt requires aid of supportive devices such as crutches, cane, wheelchairs, walkers, the use of special transportation or the assistance of another person to leave [name] [name], [name] [name] ([id number]) their place of residence. There is a normal inability to leave the home and doing so requires considerable and taxing effort. Other absences are for medical reasons / religious services and are infrequent or of short duration when for other reasons). o If current dressing causes regression in wound condition,  may D/C ordered dressing product/s and apply  Normal Saline Moist Dressing daily until next [place] / Other MD appointment. [place] of regression in wound condition at [phone]. o Please direct any NON-WOUND related issues/requests for orders to patient's Primary Care Physician - Please get INR orders from Dr End Cardiology Wound #7 [place] Nurse may visit PRN to address patientos wound care needs. o FACE TO FACE ENCOUNTER: MEDICARE and MEDICAID PATIENTS: I certify that this patient is under my care and that I had a face-to- face encounter that meets the physician face-to-face encounter requirements with this patient on this date. The encounter with the patient was in whole or in part for the following MEDICAL CONDITION: (primary reason for [place]) MEDICAL NECESSITY: I certify, that based on my findings, NURSING services are a medically necessary home health service. HOME BOUND STATUS: I certify that my clinical findings support that this patient is homebound (i.e., Due to illness or injury, pt requires aid of supportive devices such as crutches, cane, wheelchairs, walkers, the use of special transportation or the assistance of another person to leave their place of residence. There is a normal inability to leave the home and doing so requires considerable and taxing effort. Other absences are for medical reasons / religious services and are infrequent or of short duration when for other reasons). o If current dressing causes regression in wound condition, may D/C ordered dressing product/s and apply Normal Saline Moist Dressing daily until next [place] / Other MD appointment. [place] of regression in wound condition at [phone]. o Please direct any NON-WOUND related issues/requests for orders to patient's Primary Care Physician - Please get INR orders from Dr End Cardiology Patient  Medications Allergies: No Known Drug Allergies Notifications Medication Indication Start End doxycycline monohydrate 06/10/2019 DOSE oral 100 mg capsule - 1 capsule oral bid for a furhter 4 days (continuing rx) Electronic Signature(s) Signed: 06/08/2019 9:53:34 AM By: Linton Ham MD Entered By: Linton Ham on 06/08/2019 09:53:33 [name] [name] ([id number]) -------------------------------------------------------------------------------- Problem List Details Patient Name: [name] [name] Date of Service: 06/08/2019 9:15 AM Medical Record Number: [id number] Patient Account Number: [id number] Date of Birth/Sex: [birth year] (59 y.o. M) Treating RN: Montey Hora Primary Care Provider: Laverna Peace Other Clinician: Referring Provider: Laverna Peace Treating Provider/Extender: Tito Dine in Treatment: 5 Active Problems ICD-10 Evaluated Encounter Code Description Active Date Today Diagnosis I87.2 Venous insufficiency (chronic) (peripheral) 05/04/2019 No Yes I89.0 Lymphedema, not elsewhere classified 05/04/2019 No Yes L97.812 Non-pressure chronic ulcer of other part of right lower leg with fat layer 05/04/2019 No Yes exposed L97.822 Non-pressure chronic ulcer of other part of left lower leg with fat layer 05/04/2019 No Yes exposed [name] [name] (primary) hypertension 05/04/2019 No Yes J44.9 Chronic obstructive pulmonary disease, unspecified 05/04/2019 No Yes Z79.01 Long term (current) use of anticoagulants 05/04/2019 No Yes I48.0 Paroxysmal atrial fibrillation 05/04/2019 No Yes Z86.711 Personal history of pulmonary embolism 05/04/2019 No Yes 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 M) Treating RN: Montey Hora Primary Care Provider: Laverna Peace Other Clinician:  Referring Provider: Laverna Peace Treating Provider/Extender: Tito Dine in Treatment: 5 Subjective History of Present Illness (HPI) 01/18/2019 on evaluation today patient presents for initial evaluation here in our clinic concerning issues he is been having with his bilateral lower extremities. He appears based on what I am seeing to have bilateral lower extremity cellulitis along with chronic venous stasis. He has significant pitting edema even in the bottom of his foot. With that being said he also has a history of hypertension, COPD, congestive heart failure, long-term use of anticoagulants due to atrial fibrillation which is paroxysmal as well as having had a pulmonary embolism in the past. With that being said he tells me that he has constant drainage out of his left lower extremity. This is likely due to the fact that again he has significant swelling pretty much all the time. With that being said in the past 2 weeks this has been getting worse and he tells me at this point that he feels like it is infected is also hurting a lot more than it has been in the past and the drainage is a different color. The left side is definitely worse than the right. Fortunately there is no signs of active infection systemically although I am concerned due to the severity of the infection that I see in the left lower extremity especially. There is a lot of necrotic tissue on the surface of both wounds that at some point is going to need to be cleaned off to allow these to heal but right now obviously this is much too  tender for him. No fevers, chills, nausea, vomiting, or diarrhea. 02/02/2019 on evaluation today patient presents for follow-up concerning the wounds over his bilateral lower extremities. He is continuing to manage. He was in the hospital and has been discharged at this point. He was admitted from 01/21/2019 through 01/27/2019. This was due to cellulitis of lower extremity. Nonetheless he does seem to be doing much better at this point. I did review his discharge summary as well he did not appear to have any positive findings on blood cultures. His white blood cell count was never really elevated either which is also good news. He was treated with IV clindamycin for 4 days and then switch to complete a 10-day course at home which she is still working on at this point. They did have him on IV Lasix in the hospital which helped him diurese well on discharge they stated that his weight was 408 pounds which is still higher than his dry weight of 375 to 380 pounds. Upon discharge his torsemide was increased from 40 to 60 mg twice daily and he is to follow-up with cardiology as well. 12/29-Patient returns at 2 weeks for evaluation and follow-up of his bilateral lower extremity wounds, with lymphedema, venous ulcerations, patient was recently hospitalized and was aggressively diuresed after which his torsemide has been increased. He follows up with cardiology as well. His left leg stasis wounds are more moist and have maceration of tissue, the right one is less so. We are using silver cell and unna compression given the degree of edema in the legs. Readmission: 05/04/2019 patient presents today for reevaluation here in the clinic I have not seen him since December 2020. Subsequently this is a fairly sick individual with multiple comorbidities who presents for follow-up concerning wounds of his bilateral lower extremities. He tells me that after he was in the hospital following when we saw him last in December  that he ended up subsequently being discharged with home health services and they have been coming out and applying Unna boot wraps. With that being said he has used up all of his home health visits for the year and then for the last week has not had anything on his legs as far as the wrap is concerned. Obviously this is not good and he does come in today with increased swelling or at least what I perceived to be along with somewhat dry wounds again he does not seem to be weeping too much at this point which is at least good news but he has significant lymphedema/venous stasis. No fevers, chills, nausea, vomiting, or diarrhea. 06/01/2019 upon evaluation today patient is seen after not having been seen actually since 19 2021. He kept coming for appointment with every time he would come his temperature would be elevated past the point where we could see him. I told him that he needed to go be seen at the ER during 1 of these times due to the fact that obviously this was a fever of unknown origin at that point and my concern was that it could potentially be his legs but again I was not entirely sure as obviously I could not see them due to the restrictions secondary to Covid even bringing the patient into the clinic. Nonetheless he finally came in today his temperature again was elevated but not to the same degree that it met the 100.4 or above guidelines of having to cancel the appointment. Either way we did have a look at his legs unfortunately he appears to show signs of cellulitis at this point which is probably where the temperature elevations are emanating from. He has significant lymphedema as well as chronic vent venous stasis as well. When I initially told him that is why I told him to go to the ER previously when his temperature was elevated in case it was something with wounds or otherwise he replied "nobody ever told me to go to the ER". I then subsequently reminded him of me coming to the front  desk due to his temperature being elevated and advising him to go to the ER to which she replied "I am not going to the ER and I do not think I need to even with this". Nonetheless I am concerned about the infection of his legs at this point he has significant weeping, significant edema, and overall is not doing very well in my opinion. 4/23; the patient arrives in clinic today having napkin stuck on his wounds. He is taking the antibiotic from last week. Last Wednesday he had his INR checked at the Coumadin clinic it was apparently "sky high" and his Coumadin is now on hold. He is going back to check this again on Monday. His legs were not put back in compression last week out of fear of the extending infection with fever. He has been taking the antibiotics. Culture from last week showed Enterobacter and methicillin resistant staph aureus. He is on on doxycycline which the MRSA was sensitive to Enterobacter was not plated but given the pattern of sensitivities probably the doxycycline was satisfactory. I will extend the doxycycline another 4 days until he is seen next week. From an infection point of view this looks better [name] [name], [name] [name] ([id number]) Objective Constitutional Patient is hypotensive. However he does not appear unstable. Patient is febrile today.. Morbidly obese man but does not appear to be in any distress. Vitals Time Taken: 9:10 AM,  Height: 68 in, Weight: 400 lbs, BMI: 60.8, Temperature: 99.7 F, Pulse: 78 bpm, Respiratory Rate: 16 breaths/min, Blood Pressure: 98/57 mmHg. Respiratory Respiratory effort is easy and symmetric bilaterally. Rate is normal at rest and on room air.. Cardiovascular Massive uncontrolled edema.. General Notes: Wound exam Right lateral surrounding erythema but no overt tenderness. The wounds here actually are fully covered in necrotic eschar. I debrided this with an open curette. Hemostasis with direct pressure Left lateral more extensive wound areas  again necrotic surface debrided with open curette I could not get this as healthy to as healthy as surface is on the right side. There is weeping edema fluid coming out of all wounds. Integumentary (Hair, Skin) Surrounding erythema on the right greater than left legs. This is nontender and may represent more stasis dermatitis than active cellulitis. Wound #3 status is Open. Original cause of wound was Gradually Appeared. The wound is located on the Right,Proximal,Lateral Lower Leg. The wound measures 0.5cm length x 0.5cm width x 0.1cm depth; 0.196cm^2 area and 0.02cm^3 volume. There is Fat Layer (Subcutaneous Tissue) Exposed exposed. There is no tunneling or undermining noted. There is a medium amount of serous drainage noted. The wound margin is flat and intact. There is medium (34-66%) pink granulation within the wound bed. There is a medium (34-66%) amount of necrotic tissue within the wound bed including Adherent Slough. Wound #5 status is Open. Original cause of wound was Gradually Appeared. The wound is located on the Left,Lateral Lower Leg. The wound measures 10cm length x 10cm width x 0.1cm depth; 78.54cm^2 area and 7.854cm^3 volume. There is Fat Layer (Subcutaneous Tissue) Exposed exposed. There is no tunneling or undermining noted. There is a medium amount of serous drainage noted. The wound margin is flat and intact. There is medium (34-66%) pink granulation within the wound bed. There is a medium (34-66%) amount of necrotic tissue within the wound bed including Adherent Slough. Wound #6 status is Open. Original cause of wound was Gradually Appeared. The wound is located on the Left,Posterior Lower Leg. The wound measures 0.5cm length x 4cm width x 0.1cm depth; 1.571cm^2 area and 0.157cm^3 volume. There is Fat Layer (Subcutaneous Tissue) Exposed exposed. There is a large amount of serous drainage noted. The wound margin is indistinct and nonvisible. There is medium (34-66%) pink  granulation within the wound bed. There is a medium (34-66%) amount of necrotic tissue within the wound bed including Adherent Slough. Wound #7 status is Open. Original cause of wound was Gradually Appeared. The wound is located on the Right,Anterior Lower Leg. The wound measures 7cm length x 14cm width x 0.2cm depth; 76.969cm^2 area and 15.394cm^3 volume. There is Fat Layer (Subcutaneous Tissue) Exposed exposed. There is no tunneling or undermining noted. There is a medium amount of serosanguineous drainage noted. There is small (1-33%) pink granulation within the wound bed. There is a large (67-100%) amount of necrotic tissue within the wound bed including Adherent Slough. Assessment Active Problems ICD-10 Venous insufficiency (chronic) (peripheral) Lymphedema, not elsewhere classified Non-pressure chronic ulcer of other part of right lower leg with fat layer exposed Non-pressure chronic ulcer of other part of left lower leg with fat layer exposed Essential (primary) hypertension Chronic obstructive pulmonary disease, unspecified Long term (current) use of anticoagulants Paroxysmal atrial fibrillation Personal history of pulmonary embolism Chronic combined systolic (congestive) and diastolic (congestive) heart failure Kneeland, [name] [name] ([id number]) Procedures Wound #5 Pre-procedure diagnosis of Wound #5 is a Lymphedema located on the Left,Lateral Lower Leg . There was a Excisional  Skin/Subcutaneous Tissue Debridement with a total area of 100 sq cm performed by Ricard Dillon, MD. With the following instrument(s): Curette to remove Viable and Non-Viable tissue/material. Material removed includes Subcutaneous Tissue and Slough and after achieving pain control using Lidocaine 4% Topical Solution. No specimens were taken. A time out was conducted at 09:40, prior to the start of the procedure. A Minimum amount of bleeding was controlled with Pressure. The procedure was tolerated well with a  pain level of 0 throughout and a pain level of 0 following the procedure. Post Debridement Measurements: 10cm length x 10cm width x 0.2cm depth; 15.708cm^3 volume. Character of Wound/Ulcer Post Debridement is improved. Post procedure Diagnosis Wound #5: Same as Pre-Procedure Pre-procedure diagnosis of Wound #5 is a Lymphedema located on the Left,Lateral Lower Leg . There was a Haematologist Compression Therapy Procedure by Montey Hora, RN. Post procedure Diagnosis Wound #5: Same as Pre-Procedure Wound #7 Pre-procedure diagnosis of Wound #7 is a Venous Leg Ulcer located on the Right,Anterior Lower Leg .Severity of Tissue Pre Debridement is: Fat layer exposed. There was a Excisional Skin/Subcutaneous Tissue Debridement with a total area of 25 sq cm performed by Ricard Dillon, MD. With the following instrument(s): Curette to remove Viable and Non-Viable tissue/material. Material removed includes Subcutaneous Tissue and Slough and after achieving pain control using Lidocaine 4% Topical Solution. No specimens were taken. A time out was conducted at 09:38, prior to the start of the procedure. A Minimum amount of bleeding was controlled with Pressure. The procedure was tolerated well with a pain level of 0 throughout and a pain level of 0 following the procedure. Post Debridement Measurements: 7cm length x 14cm width x 0.2cm depth; 15.394cm^3 volume. Character of Wound/Ulcer Post Debridement is improved. Severity of Tissue Post Debridement is: Fat layer exposed. Post procedure Diagnosis Wound #7: Same as Pre-Procedure Pre-procedure diagnosis of Wound #7 is a Venous Leg Ulcer located on the Right,Anterior Lower Leg . There was a Haematologist Compression Therapy Procedure by Montey Hora, RN. Post procedure Diagnosis Wound #7: Same as Pre-Procedure Wound #3 Pre-procedure diagnosis of Wound #3 is a Diabetic Wound/Ulcer of the Lower Extremity located on the Right,Proximal,Lateral Lower Leg . There was  a Haematologist Compression Therapy Procedure by Montey Hora, RN. Post procedure Diagnosis Wound #3: Same as Pre-Procedure Wound #6 Pre-procedure diagnosis of Wound #6 is a Lymphedema located on the Left,Posterior Lower Leg . There was a Haematologist Compression Therapy Procedure by Montey Hora, RN. Post procedure Diagnosis Wound #6: Same as Pre-Procedure Plan Wound Cleansing: Wound #3 Right,Proximal,Lateral Lower Leg: Cleanse wound with mild soap and water May shower with protection. Wound #5 Left,Lateral Lower Leg: Cleanse wound with mild soap and water May shower with protection. Wound #6 Left,Posterior Lower Leg: Cleanse wound with mild soap and water May shower with protection. Wound #7 Right,Anterior Lower Leg: Cleanse wound with mild soap and water May shower with protection. Primary Wound Dressing: [name] [name], [name] [name] ([id number]) Wound #3 Right,Proximal,Lateral Lower Leg: Silver Alginate Wound #5 Left,Lateral Lower Leg: Silver Alginate Wound #6 Left,Posterior Lower Leg: Silver Alginate Wound #7 Right,Anterior Lower Leg: Silver Alginate Secondary Dressing: Wound #3 Right,Proximal,Lateral Lower Leg: ABD pad XtraSorb - where needed Wound #5 Left,Lateral Lower Leg: ABD pad XtraSorb - where needed Wound #6 Left,Posterior Lower Leg: ABD pad XtraSorb - where needed Wound #7 Right,Anterior Lower Leg: ABD pad XtraSorb - where needed Dressing Change Frequency: Wound #3 Right,Proximal,Lateral Lower Leg: Other: - twice weekly - Fridays and Tuesdays Wound #5  Left,Lateral Lower Leg: Other: - twice weekly - Fridays and Tuesdays Wound #6 Left,Posterior Lower Leg: Other: - twice weekly - Fridays and Tuesdays Wound #7 Right,Anterior Lower Leg: Other: - twice weekly - Fridays and Tuesdays Follow-up Appointments: Return Appointment in 1 week. Edema Control: Wound #3 Right,Proximal,Lateral Lower Leg: Unna Boots Bilaterally Elevate legs to the level of the heart and pump  ankles as often as possible Wound #5 Left,Lateral Lower Leg: Unna Boots Bilaterally Elevate legs to the level of the heart and pump ankles as often as possible Wound #6 Left,Posterior Lower Leg: Unna Boots Bilaterally Elevate legs to the level of the heart and pump ankles as often as possible Wound #7 Right,Anterior Lower Leg: Unna Boots Bilaterally Elevate legs to the level of the heart and pump ankles as often as possible Home Health: Wound #3 Right,Proximal,Lateral Lower Leg: [place] Nurse may visit PRN to address patient s wound care needs. FACE TO FACE ENCOUNTER: MEDICARE and MEDICAID PATIENTS: I certify that this patient is under my care and that I had a face-to-face encounter that meets the physician face-to-face encounter requirements with this patient on this date. The encounter with the patient was in whole or in part for the following MEDICAL CONDITION: (primary reason for [place]) MEDICAL NECESSITY: I certify, that based on my findings, NURSING services are a medically necessary home health service. HOME BOUND STATUS: I certify that my clinical findings support that this patient is homebound (i.e., Due to illness or injury, pt requires aid of supportive devices such as crutches, cane, wheelchairs, walkers, the use of special transportation or the assistance of another person to leave their place of residence. There is a normal inability to leave the home and doing so requires considerable and taxing effort. Other absences are for medical reasons / religious services and are infrequent or of short duration when for other reasons). If current dressing causes regression in wound condition, may D/C ordered dressing product/s and apply Normal Saline Moist Dressing daily until next [place] / Other MD appointment. [place] of regression in wound condition at [phone]. Please direct any NON-WOUND related  issues/requests for orders to patient's Primary Care Physician - Please get INR orders from Dr End Cardiology Wound #5 Left,Lateral Lower Leg: [place] Nurse may visit PRN to address patient s wound care needs. FACE TO FACE ENCOUNTER: MEDICARE and MEDICAID PATIENTS: I certify that this patient is under my care and that I had a face-to-face encounter that meets the physician face-to-face encounter requirements with this patient on this date. The encounter with the patient was in whole or in part for the following MEDICAL CONDITION: (primary reason for [place]) MEDICAL NECESSITY: I certify, that based on my findings, NURSING services are a medically necessary home health service. HOME BOUND STATUS: I certify that my clinical findings support that this patient is homebound (i.e., Due to illness or injury, pt requires aid of supportive devices such as crutches, cane, wheelchairs, walkers, the use of special transportation or the assistance of another person to leave their place of residence. There is a normal inability to leave the home and doing so requires considerable and taxing effort. Other absences are for medical reasons / religious services and are infrequent or of short duration when for other reasons). JOURDEN, GILSON ([id number]) If current dressing causes regression in wound condition, may D/C ordered dressing product/s and apply Normal Saline Moist Dressing daily until next [facility] / Other MD appointment. [place] of regression in wound condition at [phone]. Please direct any NON-WOUND related issues/requests for orders to patient's Primary Care Physician - Please get INR orders from Dr End Cardiology Wound #6 Left,Posterior Lower Leg: [place] Nurse may visit PRN to address patient s wound care needs. FACE TO FACE ENCOUNTER: MEDICARE and MEDICAID PATIENTS: I certify that this  patient is under my care and that I had a face-to-face encounter that meets the physician face-to-face encounter requirements with this patient on this date. The encounter with the patient was in whole or in part for the following MEDICAL CONDITION: (primary reason for [place]) MEDICAL NECESSITY: I certify, that based on my findings, NURSING services are a medically necessary home health service. HOME BOUND STATUS: I certify that my clinical findings support that this patient is homebound (i.e., Due to illness or injury, pt requires aid of supportive devices such as crutches, cane, wheelchairs, walkers, the use of special transportation or the assistance of another person to leave their place of residence. There is a normal inability to leave the home and doing so requires considerable and taxing effort. Other absences are for medical reasons / religious services and are infrequent or of short duration when for other reasons). If current dressing causes regression in wound condition, may D/C ordered dressing product/s and apply Normal Saline Moist Dressing daily until next [place] / Other MD appointment. [place] of regression in wound condition at [phone]. Please direct any NON-WOUND related issues/requests for orders to patient's Primary Care Physician - Please get INR orders from Dr End Cardiology Wound #7 Right,Anterior Lower Leg: [place] Nurse may visit PRN to address patient s wound care needs. FACE TO FACE ENCOUNTER: MEDICARE and MEDICAID PATIENTS: I certify that this patient is under my care and that I had a face-to-face encounter that meets the physician face-to-face encounter requirements with this patient on this date. The encounter with the patient was in whole or in part for the following MEDICAL CONDITION: (primary reason for [place]) MEDICAL NECESSITY: I certify, that based on my findings, NURSING  services are a medically necessary home health service. HOME BOUND STATUS: I certify that my clinical findings support that this patient is homebound (i.e., Due to illness or injury, pt requires aid of supportive devices such as crutches, cane, wheelchairs, walkers, the use of special transportation or the assistance of another person to leave their place of residence. There is a normal inability to leave the home and doing so requires considerable and taxing effort. Other absences are for medical reasons / religious services and are infrequent or of short duration when for other reasons). If current dressing causes regression in wound condition, may D/C ordered dressing product/s and apply Normal Saline Moist Dressing daily until next [place] / Other MD appointment. [place] of regression in wound condition at [phone]. Please direct any NON-WOUND related issues/requests for orders to patient's Primary Care Physician - Please get INR orders from Dr End Cardiology The following medication(s) was prescribed: doxycycline monohydrate oral 100 mg capsule 1 capsule oral bid for a furhter 4 days (continuing rx) starting 06/10/2019 His Coumadin is currently on hold but he is following up with the Coumadin clinicI have extended his doxycycline for a further 4 days from an infection point of view he is better although mild fever We put him back on  silveraginate,drawtex, ABD's under unna plut compression. Massive bilateral lymphedema we have faxed orders to [facility] fore onc change. Empasized weekly visits Electronic Signature(s) Signed: 06/08/2019 11:57:48 AM By: Linton Ham MD Entered By: Linton Ham on 06/08/2019 09:57:16 [name] [name] ([id number]) -------------------------------------------------------------------------------- SuperBill Details Patient Name: [name] [name] Date of Service: 06/08/2019 Medical Record Number: [id number] Patient Account  Number: [id number] Date of Birth/Sex: 09-Jun-1960 (59 y.o. M) Treating RN: Montey Hora Primary Care Provider: Laverna Peace Other Clinician: Referring Provider: Laverna Peace Treating Provider/Extender: Tito Dine in Treatment: 5 Diagnosis Coding ICD-10 Codes Code Description I87.2 Venous insufficiency (chronic) (peripheral) I89.0 Lymphedema, not elsewhere classified L97.812 Non-pressure chronic ulcer of other part of right lower leg with fat layer exposed L97.822 Non-pressure chronic ulcer of other part of left lower leg with fat layer exposed [place] (primary) hypertension J44.9 Chronic obstructive pulmonary disease, unspecified Z79.01 Long term (current) use of anticoagulants I48.0 Paroxysmal atrial fibrillation Z86.711 Personal history of pulmonary embolism I50.42 Chronic combined systolic (congestive) and diastolic (congestive) heart failure Facility Procedures CPT4 Code: [id number] Description: 80998 - DEB SUBQ TISSUE 20 SQ CM/< Modifier: Quantity: 1 CPT4 Code: Description: ICD-10 Diagnosis Description P38.250 Non-pressure chronic ulcer of other part of right lower leg with fat layer e N39.767 Non-pressure chronic ulcer of other part of left lower leg with fat layer ex Modifier: xposed posed Quantity: CPT4 Code: 34193790 Description: 24097 - DEB SUBQ TISS EA ADDL 20CM Modifier: Quantity: 6 CPT4 Code: Description: ICD-10 Diagnosis Description D53.299 Non-pressure chronic ulcer of other part of right lower leg with fat layer e M42.683 Non-pressure chronic ulcer of other part of left lower leg with fat layer ex Modifier: xposed posed Quantity: Physician Procedures CPT4 Code: 4196222 Description: 11042 - WC PHYS SUBQ TISS 20 SQ CM Modifier: Quantity: 1 CPT4 Code: Description: ICD-10 Diagnosis Description L97.812 Non-pressure chronic ulcer of other part of right lower leg with fat layer ex L97.822 Non-pressure chronic ulcer of other part of left  lower leg with fat layer exp Modifier: posed osed Quantity: CPT4 Code: [id number] Description: 11045 - WC PHYS SUBQ TISS EA ADDL 20 CM Modifier: Quantity: 6 CPT4 Code: Description: ICD-10 Diagnosis Description L97.812 Non-pressure chronic ulcer of other part of right lower leg with fat layer ex J94.174 Non-pressure chronic ulcer of other part of left lower leg with fat layer exp Modifier: posed osed Quantity: Electronic Signature(s) Signed: 06/08/2019 11:57:48 AM By: Linton Ham MD Entered By: Linton Ham on 06/08/2019 09:57:42

## 2019-06-12 DIAGNOSIS — M545 Low back pain: Secondary | ICD-10-CM | POA: Diagnosis not present

## 2019-06-12 DIAGNOSIS — I4819 Other persistent atrial fibrillation: Secondary | ICD-10-CM | POA: Diagnosis not present

## 2019-06-12 DIAGNOSIS — E785 Hyperlipidemia, unspecified: Secondary | ICD-10-CM | POA: Diagnosis not present

## 2019-06-12 DIAGNOSIS — M10041 Idiopathic gout, right hand: Secondary | ICD-10-CM | POA: Diagnosis not present

## 2019-06-12 DIAGNOSIS — G8194 Hemiplegia, unspecified affecting left nondominant side: Secondary | ICD-10-CM | POA: Diagnosis not present

## 2019-06-12 DIAGNOSIS — I5022 Chronic systolic (congestive) heart failure: Secondary | ICD-10-CM | POA: Diagnosis not present

## 2019-06-12 DIAGNOSIS — I872 Venous insufficiency (chronic) (peripheral): Secondary | ICD-10-CM | POA: Diagnosis not present

## 2019-06-12 DIAGNOSIS — G894 Chronic pain syndrome: Secondary | ICD-10-CM | POA: Diagnosis not present

## 2019-06-12 DIAGNOSIS — Z7901 Long term (current) use of anticoagulants: Secondary | ICD-10-CM | POA: Diagnosis not present

## 2019-06-12 DIAGNOSIS — Z792 Long term (current) use of antibiotics: Secondary | ICD-10-CM | POA: Diagnosis not present

## 2019-06-12 DIAGNOSIS — Z6841 Body Mass Index (BMI) 40.0 and over, adult: Secondary | ICD-10-CM | POA: Diagnosis not present

## 2019-06-12 DIAGNOSIS — I110 Hypertensive heart disease with heart failure: Secondary | ICD-10-CM | POA: Diagnosis not present

## 2019-06-12 DIAGNOSIS — Z86718 Personal history of other venous thrombosis and embolism: Secondary | ICD-10-CM | POA: Diagnosis not present

## 2019-06-12 DIAGNOSIS — E662 Morbid (severe) obesity with alveolar hypoventilation: Secondary | ICD-10-CM | POA: Diagnosis not present

## 2019-06-12 DIAGNOSIS — F321 Major depressive disorder, single episode, moderate: Secondary | ICD-10-CM | POA: Diagnosis not present

## 2019-06-12 DIAGNOSIS — I2510 Atherosclerotic heart disease of native coronary artery without angina pectoris: Secondary | ICD-10-CM | POA: Diagnosis not present

## 2019-06-12 DIAGNOSIS — J449 Chronic obstructive pulmonary disease, unspecified: Secondary | ICD-10-CM | POA: Diagnosis not present

## 2019-06-12 DIAGNOSIS — M1611 Unilateral primary osteoarthritis, right hip: Secondary | ICD-10-CM | POA: Diagnosis not present

## 2019-06-12 DIAGNOSIS — G510 Bell's palsy: Secondary | ICD-10-CM | POA: Diagnosis not present

## 2019-06-12 DIAGNOSIS — L97811 Non-pressure chronic ulcer of other part of right lower leg limited to breakdown of skin: Secondary | ICD-10-CM | POA: Diagnosis not present

## 2019-06-12 DIAGNOSIS — G4733 Obstructive sleep apnea (adult) (pediatric): Secondary | ICD-10-CM | POA: Diagnosis not present

## 2019-06-12 DIAGNOSIS — I890 Lymphedema, not elsewhere classified: Secondary | ICD-10-CM | POA: Diagnosis not present

## 2019-06-12 DIAGNOSIS — D649 Anemia, unspecified: Secondary | ICD-10-CM | POA: Diagnosis not present

## 2019-06-12 DIAGNOSIS — E1151 Type 2 diabetes mellitus with diabetic peripheral angiopathy without gangrene: Secondary | ICD-10-CM | POA: Diagnosis not present

## 2019-06-12 DIAGNOSIS — L97821 Non-pressure chronic ulcer of other part of left lower leg limited to breakdown of skin: Secondary | ICD-10-CM | POA: Diagnosis not present

## 2019-06-13 ENCOUNTER — Telehealth: Payer: Self-pay

## 2019-06-13 NOTE — Telephone Encounter (Signed)
Copied from CRM [id number]. Topic: General - Other >> Jun 13, 2019 11:00 AM Dalphine Handing A wrote: Patient is requesting a callback from nurse with a status update on his most recent home health referral. Patient stated that he reached out to home health agency and they havent received order.

## 2019-06-13 NOTE — Telephone Encounter (Signed)
On 06/06/2019 telephone encounter with CMA, [name] [name] reported a home health was there for evaluation.  Maralyn Sago can you see if referral is processed ?  Referral was placed.   If all is done on referral's end we can see if we can get contact for [facility] services to see what is going on with his situation. I feel like with his noncompliance and limited resources he would benefit for assisted living of some type.

## 2019-06-13 NOTE — Telephone Encounter (Signed)
[name] [name] I wasn't sure to forward this to your or the referral department. I spoke with [name] on the phone and he states that no one has reached back to him in regards to home health service. I see referral was placed in patients chart on 05/30/19, in documentation on that date it was noted that [facility] will not accept patient back due to noncompliance. I also saw in order a message that stated [place] from [facility] services will begin on 06/05/19. Patient states that no one has come out to home for evaluation or contact him. Please advise. KW

## 2019-06-14 ENCOUNTER — Other Ambulatory Visit: Payer: Self-pay

## 2019-06-14 ENCOUNTER — Encounter: Payer: Medicaid Other | Admitting: Physician Assistant

## 2019-06-14 DIAGNOSIS — L97812 Non-pressure chronic ulcer of other part of right lower leg with fat layer exposed: Secondary | ICD-10-CM | POA: Diagnosis not present

## 2019-06-14 DIAGNOSIS — I872 Venous insufficiency (chronic) (peripheral): Secondary | ICD-10-CM | POA: Diagnosis not present

## 2019-06-14 DIAGNOSIS — Z6841 Body Mass Index (BMI) 40.0 and over, adult: Secondary | ICD-10-CM | POA: Diagnosis not present

## 2019-06-14 DIAGNOSIS — I509 Heart failure, unspecified: Secondary | ICD-10-CM | POA: Diagnosis not present

## 2019-06-14 DIAGNOSIS — Z7901 Long term (current) use of anticoagulants: Secondary | ICD-10-CM | POA: Diagnosis not present

## 2019-06-14 DIAGNOSIS — Z86711 Personal history of pulmonary embolism: Secondary | ICD-10-CM | POA: Diagnosis not present

## 2019-06-14 DIAGNOSIS — I110 Hypertensive heart disease with heart failure: Secondary | ICD-10-CM | POA: Diagnosis not present

## 2019-06-14 DIAGNOSIS — I480 Paroxysmal atrial fibrillation: Secondary | ICD-10-CM | POA: Diagnosis not present

## 2019-06-14 DIAGNOSIS — I890 Lymphedema, not elsewhere classified: Secondary | ICD-10-CM | POA: Diagnosis not present

## 2019-06-14 DIAGNOSIS — I5042 Chronic combined systolic (congestive) and diastolic (congestive) heart failure: Secondary | ICD-10-CM | POA: Diagnosis not present

## 2019-06-14 DIAGNOSIS — I10 Essential (primary) hypertension: Secondary | ICD-10-CM | POA: Diagnosis not present

## 2019-06-14 DIAGNOSIS — L97822 Non-pressure chronic ulcer of other part of left lower leg with fat layer exposed: Secondary | ICD-10-CM | POA: Diagnosis not present

## 2019-06-14 DIAGNOSIS — J449 Chronic obstructive pulmonary disease, unspecified: Secondary | ICD-10-CM | POA: Diagnosis not present

## 2019-06-14 DIAGNOSIS — E11622 Type 2 diabetes mellitus with other skin ulcer: Secondary | ICD-10-CM | POA: Diagnosis not present

## 2019-06-15 ENCOUNTER — Ambulatory Visit: Payer: Self-pay | Admitting: *Deleted

## 2019-06-15 ENCOUNTER — Ambulatory Visit: Payer: Medicaid Other | Admitting: Physician Assistant

## 2019-06-15 DIAGNOSIS — J449 Chronic obstructive pulmonary disease, unspecified: Secondary | ICD-10-CM

## 2019-06-15 DIAGNOSIS — J9601 Acute respiratory failure with hypoxia: Secondary | ICD-10-CM

## 2019-06-15 DIAGNOSIS — M10041 Idiopathic gout, right hand: Secondary | ICD-10-CM

## 2019-06-15 NOTE — Progress Notes (Addendum)
REG, UHLAND ([id number]) Visit Report for 06/14/2019 Arrival Information Details Patient Name: [name] [name], [name] [name] Date of Service: 06/14/2019 3:30 PM Medical Record Number: [id number] Patient Account Number: [id number] Date of Birth/Sex: 09/10/1960 (59 y.o. M) Treating RN: Rodell Perna Primary Care Lounell Schumacher: Marvell Fuller Other Clinician: Referring Valecia Beske: Marvell Fuller Treating Circe Chilton/Extender: Linwood Dibbles, HOYT Weeks in Treatment: 5 Visit Information History Since Last Visit Added or deleted any medications: No Patient Arrived: Wheel Chair Any new allergies or adverse reactions: No Arrival Time: 15:24 Had a fall or experienced change in No Accompanied By: self activities of daily living that may affect Transfer Assistance: None risk of falls: Patient Identification Verified: Yes Signs or symptoms of abuse/neglect since last visito No Secondary Verification Process Completed: Yes Hospitalized since last visit: No Patient Has Alerts: Yes Implantable device outside of the clinic excluding No Patient Alerts: Patient on Blood Thinner cellular tissue based products placed in the center Warfarin since last visit: Pain Present Now: Yes Electronic Signature(s) Signed: 06/14/2019 4:37:37 PM By: Dayton Martes RCP, RRT, CHT Entered By: Dayton Martes on 06/14/2019 15:25:11 [name] [name] ([id number]) -------------------------------------------------------------------------------- Compression Therapy Details Patient Name: [name] [name] Date of Service: 06/14/2019 3:30 PM Medical Record Number: [id number] Patient Account Number: [id number] Date of Birth/Sex: [birth year] (59 y.o. M) Treating RN: Rodell Perna Primary Care Fitzgerald Dunne: Marvell Fuller Other Clinician: Referring Eugenie Harewood: Marvell Fuller Treating Shadana Pry/Extender: STONE III, HOYT Weeks in Treatment: 5 Compression Therapy Performed for Wound Assessment: Wound #5 Left,Lateral  Lower Leg Performed By: Clinician Rodell Perna, RN Compression Type: Henriette Combs Post Procedure Diagnosis Same as Pre-procedure Electronic Signature(s) Signed: 06/14/2019 4:32:24 PM By: Rodell Perna Entered By: Rodell Perna on 06/14/2019 16:16:34 [name] [name] ([id number]) -------------------------------------------------------------------------------- Compression Therapy Details Patient Name: [name] [name] Date of Service: 06/14/2019 3:30 PM Medical Record Number: [id number] Patient Account Number: [id number] Date of Birth/Sex: Jul 25, 1960 (59 y.o. M) Treating RN: Rodell Perna Primary Care Ottavio Norem: Marvell Fuller Other Clinician: Referring Valentino Saavedra: Marvell Fuller Treating Lincoln Ginley/Extender: Linwood Dibbles, HOYT Weeks in Treatment: 5 Compression Therapy Performed for Wound Assessment: Wound #3 Right,Proximal,Lateral Lower Leg Performed By: Clinician Rodell Perna, RN Compression Type: Henriette Combs Post Procedure Diagnosis Same as Pre-procedure Electronic Signature(s) Signed: 06/14/2019 4:32:24 PM By: Rodell Perna Entered By: Rodell Perna on 06/14/2019 16:16:34 [name] [name] ([id number]) -------------------------------------------------------------------------------- Compression Therapy Details Patient Name: [name] [name] Date of Service: 06/14/2019 3:30 PM Medical Record Number: [id number] Patient Account Number: [id number] Date of Birth/Sex: [birth year] (59 y.o. M) Treating RN: Rodell Perna Primary Care Epic Tribbett: Marvell Fuller Other Clinician: Referring Denzel Etienne: Marvell Fuller Treating Everson Mott/Extender: Linwood Dibbles, HOYT Weeks in Treatment: 5 Compression Therapy Performed for Wound Assessment: Wound #7 Right,Anterior Lower Leg Performed By: Clinician Rodell Perna, RN Compression Type: Henriette Combs Post Procedure Diagnosis Same as Pre-procedure Electronic Signature(s) Signed: 06/14/2019 4:32:24 PM By: Rodell Perna Entered By: Rodell Perna on 06/14/2019  16:16:35 [name] [name] ([id number]) -------------------------------------------------------------------------------- Encounter Discharge Information Details Patient Name: [name] [name] Date of Service: 06/14/2019 3:30 PM Medical Record Number: [id number] Patient Account Number: [id number] Date of Birth/Sex: [birth year] (59 y.o. M) Treating RN: Rodell Perna Primary Care Ammi Hutt: Marvell Fuller Other Clinician: Referring Kewanna Kasprzak: Marvell Fuller Treating Luisdavid Hamblin/Extender: Linwood Dibbles, HOYT Weeks in Treatment: 5 Encounter Discharge Information Items Discharge Condition: Stable Ambulatory Status: Wheelchair Discharge Destination: Home Transportation: Private Auto Accompanied By: self Schedule Follow-up Appointment: Yes Clinical Summary of Care: Electronic Signature(s) Signed: 06/14/2019 4:32:24 PM By: Rodell Perna Entered By: Rodell Perna on 06/14/2019 16:19:43 [name] [name] ([id number]) -------------------------------------------------------------------------------- Lower Extremity Assessment Details Patient Name: [name] [name] Date of  Service: 06/14/2019 3:30 PM Medical Record Number: [id number] Patient Account Number: [id number] Date of Birth/Sex: 25-May-1960 (59 y.o. M) Treating RN: Curtis Sites Primary Care Roarke Marciano: Marvell Fuller Other Clinician: Referring Benicio Manna: Marvell Fuller Treating Sakiya Stepka/Extender: STONE III, HOYT Weeks in Treatment: 5 Edema Assessment Assessed: [Left: No] [Right: No] Edema: [Left: Yes] [Right: Yes] Calf Left: Right: Point of Measurement: 31 cm From Medial Instep 57 cm 63 cm Ankle Left: Right: Point of Measurement: 13 cm From Medial Instep 35 cm 34 cm Vascular Assessment Pulses: Dorsalis Pedis Palpable: [Left:Yes] [Right:Yes] Electronic Signature(s) Signed: 06/14/2019 4:42:54 PM By: Curtis Sites Entered By: Curtis Sites on 06/14/2019 15:50:15 [name] [name]  ([id number]) -------------------------------------------------------------------------------- Multi Wound Chart Details Patient Name: [name] [name] Date of Service: 06/14/2019 3:30 PM Medical Record Number: [id number] Patient Account Number: [id number] Date of Birth/Sex: 07-23-1960 (59 y.o. 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 Comorbid History: Asthma, Chronic Obstructive Asthma, Chronic Obstructive N/A Pulmonary Disease (COPD), Sleep Pulmonary Disease (COPD), Sleep Apnea, Arrhythmia, Congestive Apnea, Arrhythmia, Congestive Heart Failure, Hypertension, Heart Failure, Hypertension, Myocardial Infarction, Type II Myocardial Infarction, Type II Diabetes Diabetes Date Acquired: 02/16/2019 02/12/2019 06/01/2019 Weeks of Treatment: 5 5 1  Wound Status: Open Open Healed - Epithelialized Clustered Wound: No Yes No Clustered Quantity: N/A 2 N/A Measurements L x W x D (cm) 0.4x0.4x0.1 5x8x0.1 0x0x0 Area (cm) : 0.126 31.416 0 Volume (cm) : 0.013 3.142 0 % Reduction in Area: 66.60% -58.70% 100.00% % Reduction in Volume: 65.80% -58.80% 100.00% Classification: Grade 1 Full Thickness Without Exposed Full Thickness Without Exposed Support Structures Support Structures Exudate Amount: Medium Medium N/A Exudate Type: Serous Serous N/A Exudate Color: amber amber N/A Wound Margin: Flat and Intact  Flat and Intact N/A Granulation Amount: Medium (34-66%) Medium (34-66%) N/A Granulation Quality: Pink Pink N/A Necrotic Amount: Medium (34-66%) Medium (34-66%) N/A Exposed Structures: Fat Layer (Subcutaneous Tissue) Fat Layer (Subcutaneous Tissue) N/A Exposed: Yes Exposed: Yes Fascia: No Fascia: No Tendon: No Tendon: No Muscle: No Muscle: No Joint: No Joint: No Bone: No Bone: No Epithelialization: Small (1-33%) None N/A Wound Number: 7 N/A N/A Photos: N/A N/A [name] [name], [name] [name] ([name] [name]) Wound Location: Right, Anterior Lower Leg N/A N/A Wounding Event: Gradually Appeared N/A N/A Primary Etiology: Venous Leg Ulcer N/A N/A Comorbid History: Asthma, Chronic Obstructive N/A N/A Pulmonary Disease (COPD), Sleep Apnea, Arrhythmia, Congestive Heart Failure, Hypertension, Myocardial Infarction, Type II Diabetes Date Acquired: 05/17/2019 N/A N/A Weeks of Treatment: 0 N/A N/A Wound Status: Open N/A N/A Clustered Wound: No N/A N/A Clustered Quantity: N/A N/A N/A Measurements L x W x D (cm) 5.3x8.8x0.1 N/A N/A Area (cm) : 36.631 N/A N/A Volume (cm) : 3.663 N/A N/A % Reduction in Area: 52.40% N/A N/A % Reduction in Volume: 76.20% N/A N/A Classification: Full Thickness Without Exposed N/A N/A Support Structures Exudate Amount: Medium N/A N/A Exudate Type: Serosanguineous N/A N/A Exudate Color: red, brown N/A N/A Wound Margin: N/A N/A N/A Granulation Amount: Small (1-33%) N/A N/A Granulation Quality: Pink N/A N/A Necrotic Amount: Large (67-100%) N/A N/A Exposed Structures: Fat Layer (Subcutaneous Tissue) N/A N/A Exposed: Yes Fascia: No  Tendon: No Muscle: No Joint: No Bone: No Epithelialization: None N/A N/A Treatment Notes Electronic Signature(s) Signed: 06/14/2019 4:32:24 PM By: Rodell Perna Entered By: Rodell Perna on 06/14/2019 16:13:30 [name] [name] ([id number]) -------------------------------------------------------------------------------- Multi-Disciplinary  Care Plan Details Patient Name: [name] [name] Date of Service: 06/14/2019 3:30 PM Medical Record Number: [id number] Patient Account Number: [id number] Date of Birth/Sex: December 20, 1960 (59 y.o. M) Treating RN: Rodell Perna Primary Care Kelso Bibby: Marvell Fuller Other Clinician: Referring Nicolaos Mitrano: Marvell Fuller Treating Myelle Poteat/Extender: Linwood Dibbles, HOYT Weeks in Treatment: 5 Active Inactive Abuse / Safety / Falls / Self Care Management Nursing Diagnoses: Potential for falls Goals: Patient will remain injury free related to falls Date Initiated: 05/04/2019 Target Resolution Date: 07/21/2019 Goal Status: Active Interventions: Assess fall risk on admission and as needed Provide education on basic hygiene Notes: Nutrition Nursing Diagnoses: Impaired glucose control: actual or potential Goals: Patient/caregiver agrees to and verbalizes understanding of need to use nutritional supplements and/or vitamins as prescribed Date Initiated: 05/04/2019 Target Resolution Date: 07/21/2019 Goal Status: Active Interventions: Assess patient nutrition upon admission and as needed per policy Notes: Orientation to the Wound Care Program Nursing Diagnoses: Knowledge deficit related to the wound healing center program Goals: Patient/caregiver will verbalize understanding of the Wound Healing Center Program Date Initiated: 05/04/2019 Target Resolution Date: 07/21/2019 Goal Status: Active Interventions: Provide education on orientation to the wound center Notes: Venous Leg Ulcer Nursing Diagnoses: Potential for venous Insuffiency (use before diagnosis confirmed) Goals: Patient will maintain optimal edema control Date Initiated: 05/04/2019 Target Resolution Date: 07/21/2019 Goal Status: Active [name] [name], [name] [name] ([id number]) Interventions: Compression as ordered Notes: Wound/Skin Impairment Nursing Diagnoses: Impaired tissue integrity Goals: Ulcer/skin breakdown will heal within 14  weeks Date Initiated: 05/04/2019 Target Resolution Date: 07/21/2019 Goal Status: Active Interventions: Assess patient/caregiver ability to obtain necessary supplies Assess patient/caregiver ability to perform ulcer/skin care regimen upon admission and as needed Assess ulceration(s) every visit Notes: Electronic Signature(s) Signed: 06/14/2019 4:32:24 PM By: Rodell Perna Entered By: Rodell Perna on 06/14/2019 16:13:23 [name] [name] ([id number]) -------------------------------------------------------------------------------- Pain Assessment Details Patient Name: [name] [name] Date of Service: 06/14/2019 3:30 PM Medical Record Number: [id number] Patient Account Number: [id number] Date of Birth/Sex: 1960-03-02 (59 y.o. M) Treating RN: Curtis Sites Primary Care Teri Legacy: Marvell Fuller Other Clinician: Referring Dameshia Seybold: Marvell Fuller Treating Arless Vineyard/Extender: Linwood Dibbles, HOYT Weeks in Treatment: 5 Active Problems Location of Pain Severity and Description of Pain Patient Has Paino Yes Site Locations Pain Management and Medication Current Pain Management: Electronic Signature(s) Signed: 06/14/2019 4:42:54 PM By: Curtis Sites Entered By: Curtis Sites on 06/14/2019 15:42:05 [name] [name] ([id number]) -------------------------------------------------------------------------------- Patient/Caregiver Education Details Patient Name: [name] [name] Date of Service: 06/14/2019 3:30 PM Medical Record Number: [id number] Patient Account Number: [id number] Date of Birth/Gender: [birth year] (59 y.o. M) Treating RN: Rodell Perna Primary Care Physician: Marvell Fuller Other Clinician: Referring Physician: Marvell Fuller Treating Physician/Extender: Skeet Simmer in Treatment: 5 Education Assessment Education Provided To: Patient Education Topics Provided Wound/Skin Impairment: Handouts: Caring for Your Ulcer Methods: Demonstration,  Explain/Verbal Responses: State content correctly Electronic Signature(s) Signed: 06/14/2019 4:32:24 PM By: Rodell Perna Entered By: Rodell Perna on 06/14/2019 16:19:09 [name] [name] ([id number]) -------------------------------------------------------------------------------- Wound Assessment Details Patient Name: [name] [name] Date of Service: 06/14/2019 3:30 PM Medical Record Number: [id number] Patient Account Number: [id number] Date of Birth/Sex: [birth year] (59 y.o. M) Treating RN: Curtis Sites Primary Care Jayton Popelka: Marvell Fuller Other Clinician: Referring Leanthony Rhett: Marvell Fuller Treating Dayani Winbush/Extender: STONE III, HOYT Weeks in Treatment: 5 Wound Status Wound Number: 3 Primary Diabetic Wound/Ulcer of the Lower Extremity Etiology:  Wound Location: Right, Proximal, Lateral Lower Leg Wound Open Wounding Event: Gradually Appeared Status: Date Acquired: 02/16/2019 Comorbid Asthma, Chronic Obstructive Pulmonary Disease (COPD), Weeks Of Treatment: 5 History: Sleep Apnea, Arrhythmia, Congestive Heart Failure, Clustered Wound: No Hypertension, Myocardial Infarction, Type II Diabetes Photos Wound Measurements Length: (cm) 0.4 % R Width: (cm) 0.4 % R Depth: (cm) 0.1 Epi Area: (cm) 0.126 Tu Volume: (cm) 0.013 Un eduction in Area: 66.6% eduction in Volume: 65.8% thelialization: Small (1-33%) nneling: No dermining: No Wound Description Classification: Grade 1 Fou Wound Margin: Flat and Intact Slo Exudate Amount: Medium Exudate Type: Serous Exudate Color: amber l Odor After Cleansing: No ugh/Fibrino Yes Wound Bed Granulation Amount: Medium (34-66%) Exposed Structure Granulation Quality: Pink Fascia Exposed: No Necrotic Amount: Medium (34-66%) Fat Layer (Subcutaneous Tissue) Exposed: Yes Necrotic Quality: Adherent Slough Tendon Exposed: No Muscle Exposed: No Joint Exposed: No Bone Exposed: No Treatment Notes Wound #3 (Right, Proximal, Lateral Lower  Leg) Notes xsorb, unna boots Electronic Signature(s) ROBB, SIBAL ([id number]) Signed: 06/14/2019 4:42:54 PM By: Curtis Sites Entered By: Curtis Sites on 06/14/2019 15:56:56 [name] [name] ([id number]) -------------------------------------------------------------------------------- Wound Assessment Details Patient Name: [name] [name] Date of Service: 06/14/2019 3:30 PM Medical Record Number: [id number] Patient Account Number: [id number] Date of Birth/Sex: [birth year] (59 y.o. M) Treating RN: Curtis Sites Primary Care Clarence Cogswell: Marvell Fuller Other Clinician: Referring Zackory Pudlo: Marvell Fuller Treating Akera Snowberger/Extender: STONE III, HOYT Weeks in Treatment: 5 Wound Status Wound Number: 5 Primary Lymphedema Etiology: Wound Location: Left, Lateral Lower Leg Wound Open Wounding Event: Gradually Appeared Status: Date Acquired: 02/12/2019 Comorbid Asthma, Chronic Obstructive Pulmonary Disease (COPD), Weeks Of Treatment: 5 History: Sleep Apnea, Arrhythmia, Congestive Heart Failure, Clustered Wound: Yes Hypertension, Myocardial Infarction, Type II Diabetes Photos Wound Measurements Length: (cm) 5 Width: (cm) 8 Depth: (cm) 0.1 Clustered Quantity: 2 Area: (cm) 31.416 Volume: (cm) 3.142 % Reduction in Area: -58.7% % Reduction in Volume: -58.8% Epithelialization: None Tunneling: No Undermining: No Wound Description Classification: Full Thickness Without Exposed Support Struct Wound Margin: Flat and Intact Exudate Amount: Medium Exudate Type: Serous Exudate Color: amber ures Foul Odor After Cleansing: No Slough/Fibrino Yes Wound Bed Granulation Amount: Medium (34-66%) Exposed Structure Granulation Quality: Pink Fascia Exposed: No Necrotic Amount: Medium (34-66%) Fat Layer (Subcutaneous Tissue) Exposed: Yes Necrotic Quality: Adherent Slough Tendon Exposed: No Muscle Exposed: No Joint Exposed: No Bone Exposed: No Treatment Notes Wound #5 (Left,  Lateral Lower Leg) Notes xsorb, unna boots ROTH, RESS ([id number]) Electronic Signature(s) Signed: 06/14/2019 4:42:54 PM By: Curtis Sites Entered By: Curtis Sites on 06/14/2019 15:57:52 [name] [name] ([id number]) -------------------------------------------------------------------------------- Wound Assessment Details Patient Name: [name] [name] Date of Service: 06/14/2019 3:30 PM Medical Record Number: [id number] Patient Account Number: [id number] Date of Birth/Sex: 1960/05/18 (59 y.o. M) Treating RN: Curtis Sites Primary Care Aashvi Rezabek: Marvell Fuller Other Clinician: Referring Azaiah Mello: Marvell Fuller Treating Kharter Sestak/Extender: STONE III, HOYT Weeks in Treatment: 5 Wound Status Wound Number: 6 Primary Etiology: Lymphedema Wound Location: Left, Posterior Lower Leg Wound Status: Healed - Epithelialized Wounding Event: Gradually Appeared Date Acquired: 06/01/2019 Weeks Of Treatment: 1 Clustered Wound: No Wound Measurements Length: (cm) Width: (cm) Depth: (cm) Area: (cm) Volume: (cm) 0 % Reduction in Area: 100% 0 % Reduction in Volume: 100% 0 0 0 Wound Description Classification: Full Thickness Without Exposed Support Structu res Electronic Signature(s) Signed: 06/14/2019 4:42:54 PM By: Curtis Sites Entered By: Curtis Sites on 06/14/2019 15:57:20 [name] [name] ([id number]) -------------------------------------------------------------------------------- Wound Assessment Details Patient Name: [name] [name] Date of Service: 06/14/2019 3:30 PM Medical Record Number: [id number] Patient Account Number: [id number] Date  of Birth/Sex: 1960/11/16 (59 y.o. M) Treating RN: Montey Hora Primary Care Kimberlyn Quiocho: Laverna Peace Other Clinician: Referring Glennis Borger: Laverna Peace Treating Dal Blew/Extender: STONE III, HOYT Weeks in Treatment: 5 Wound Status Wound Number: 7 Primary Venous Leg Ulcer Etiology: Wound Location: Right, Anterior  Lower Leg Wound Open Wounding Event: Gradually Appeared Status: Date Acquired: 05/17/2019 Comorbid Asthma, Chronic Obstructive Pulmonary Disease (COPD), Weeks Of Treatment: 0 History: Sleep Apnea, Arrhythmia, Congestive Heart Failure, Clustered Wound: No Hypertension, Myocardial Infarction, Type II Diabetes Photos Wound Measurements Length: (cm) 5.3 Width: (cm) 8.8 Depth: (cm) 0.1 Area: (cm) 36.631 Volume: (cm) 3.663 % Reduction in Area: 52.4% % Reduction in Volume: 76.2% Epithelialization: None Tunneling: No Undermining: No Wound Description Classification: Full Thickness Without Exposed Support Struct Exudate Amount: Medium Exudate Type: Serosanguineous Exudate Color: red, brown ures Foul Odor After Cleansing: No Slough/Fibrino Yes Wound Bed Granulation Amount: Small (1-33%) Exposed Structure Granulation Quality: Pink Fascia Exposed: No Necrotic Amount: Large (67-100%) Fat Layer (Subcutaneous Tissue) Exposed: Yes Necrotic Quality: Adherent Slough Tendon Exposed: No Muscle Exposed: No Joint Exposed: No Bone Exposed: No Treatment Notes Wound #7 (Right, Anterior Lower Leg) Notes xsorb, unna boots Electronic Signature(s) Signed: 06/14/2019 4:42:54 PM By: Alvina Filbert ([id number]) Entered By: Montey Hora on 06/14/2019 15:58:13 [name] [name] ([id number]) -------------------------------------------------------------------------------- [place] Details Patient Name: [name] [name] Date of Service: 06/14/2019 3:30 PM Medical Record Number: [id number] Patient Account Number: [id number] Date of Birth/Sex: [birth year] (59 y.o. M) Treating RN: Army Melia Primary Care Jordanna Hendrie: Laverna Peace Other Clinician: Referring Latasia Silberstein: Laverna Peace Treating Leiann Sporer/Extender: STONE III, HOYT Weeks in Treatment: 5 Vital Signs Time Taken: 15:25 Temperature (F): 99.6 Height (in): 68 Pulse (bpm): 79 Weight (lbs): 400 Respiratory Rate  (breaths/min): 18 Body Mass Index (BMI): 60.8 Blood Pressure (mmHg): 140/69 Reference Range: 80 - 120 mg / dl Electronic Signature(s) Signed: 06/14/2019 4:37:37 PM By: Lorine Bears RCP, RRT, CHT Entered By: Becky Sax, Amado Nash on 06/14/2019 15:28:49

## 2019-06-15 NOTE — Progress Notes (Addendum)
KARIN, GRIFFITH ([id number]) Visit Report for 06/14/2019 Chief Complaint Document Details Patient Name: [name] [name], [name] [name] Date of Service: 06/14/2019 3:30 PM Medical Record Number: [id number] Patient Account Number: [id number] Date of Birth/Sex: 10/12/1960 (59 y.o. M) Treating RN: Army Melia Primary Care Provider: Laverna Peace Other Clinician: Referring Provider: Laverna Peace Treating Provider/Extender: Melburn Hake, Jocsan Mcginley Weeks in Treatment: 5 Information Obtained from: Patient Chief Complaint Bilateral LE ulcers Electronic Signature(s) Signed: 06/14/2019 3:17:43 PM By: Worthy Keeler PA-C Entered By: Worthy Keeler on 06/14/2019 15:17:43 [name] [name] ([id number]) -------------------------------------------------------------------------------- HPI Details Patient Name: [name] [name] Date of Service: 06/14/2019 3:30 PM Medical Record Number: [id number] Patient Account Number: [id number] Date of Birth/Sex: [birth year] (59 y.o. M) Treating RN: Army Melia Primary Care Provider: Laverna Peace Other Clinician: Referring Provider: Laverna Peace Treating Provider/Extender: Melburn Hake, Zarin Hagmann Weeks in Treatment: 5 History of Present Illness HPI Description: 01/18/2019 on evaluation today patient presents for initial evaluation here in our clinic concerning issues he is been having with his bilateral lower extremities. He appears based on what I am seeing to have bilateral lower extremity cellulitis along with chronic venous stasis. He has significant pitting edema even in the bottom of his foot. With that being said he also has a history of hypertension, COPD, congestive heart failure, long-term use of anticoagulants due to atrial fibrillation which is paroxysmal as well as having had a pulmonary embolism in the past. With that being said he tells me that he has constant drainage out of his left lower extremity. This is likely due to the fact that again he has  significant swelling pretty much all the time. With that being said in the past 2 weeks this has been getting worse and he tells me at this point that he feels like it is infected is also hurting a lot more than it has been in the past and the drainage is a different color. The left side is definitely worse than the right. Fortunately there is no signs of active infection systemically although I am concerned due to the severity of the infection that I see in the left lower extremity especially. There is a lot of necrotic tissue on the surface of both wounds that at some point is going to need to be cleaned off to allow these to heal but right now obviously this is much too tender for him. No fevers, chills, nausea, vomiting, or diarrhea. 02/02/2019 on evaluation today patient presents for follow-up concerning the wounds over his bilateral lower extremities. He is continuing to manage. He was in the hospital and has been discharged at this point. He was admitted from 01/21/2019 through 01/27/2019. This was due to cellulitis of lower extremity. Nonetheless he does seem to be doing much better at this point. I did review his discharge summary as well he did not appear to have any positive findings on blood cultures. His white blood cell count was never really elevated either which is also good news. He was treated with IV clindamycin for 4 days and then switch to complete a 10-day course at home which she is still working on at this point. They did have him on IV Lasix in the hospital which helped him diurese well on discharge they stated that his weight was 408 pounds which is still higher than his dry weight of 375 to 380 pounds. Upon discharge his torsemide was increased from 40 to 60 mg twice daily and he is to follow-up with cardiology as well. 12/29-Patient returns at 2 weeks  for evaluation and follow-up of his bilateral lower extremity wounds, with lymphedema, venous ulcerations, patient was  recently hospitalized and was aggressively diuresed after which his torsemide has been increased. He follows up with cardiology as well. His left leg stasis wounds are more moist and have maceration of tissue, the right one is less so. We are using silver cell and unna compression given the degree of edema in the legs. Readmission: 05/04/2019 patient presents today for reevaluation here in the clinic I have not seen him since December 2020. Subsequently this is a fairly sick individual with multiple comorbidities who presents for follow-up concerning wounds of his bilateral lower extremities. He tells me that after he was in the hospital following when we saw him last in December that he ended up subsequently being discharged with home health services and they have been coming out and applying Unna boot wraps. With that being said he has used up all of his home health visits for the year and then for the last week has not had anything on his legs as far as the wrap is concerned. Obviously this is not good and he does come in today with increased swelling or at least what I perceived to be along with somewhat dry wounds again he does not seem to be weeping too much at this point which is at least good news but he has significant lymphedema/venous stasis. No fevers, chills, nausea, vomiting, or diarrhea. 06/01/2019 upon evaluation today patient is seen after not having been seen actually since 19 2021. He kept coming for appointment with every time he would come his temperature would be elevated past the point where we could see him. I told him that he needed to go be seen at the ER during 1 of these times due to the fact that obviously this was a fever of unknown origin at that point and my concern was that it could potentially be his legs but again I was not entirely sure as obviously I could not see them due to the restrictions secondary to Covid even bringing the patient into the clinic. Nonetheless  he finally came in today his temperature again was elevated but not to the same degree that it met the 100.4 or above guidelines of having to cancel the appointment. Either way we did have a look at his legs unfortunately he appears to show signs of cellulitis at this point which is probably where the temperature elevations are emanating from. He has significant lymphedema as well as chronic vent venous stasis as well. When I initially told him that is why I told him to go to the ER previously when his temperature was elevated in case it was something with wounds or otherwise he replied "nobody ever told me to go to the ER". I then subsequently reminded him of me coming to the front desk due to his temperature being elevated and advising him to go to the ER to which she replied "I am not going to the ER and I do not think I need to even with this". Nonetheless I am concerned about the infection of his legs at this point he has significant weeping, significant edema, and overall is not doing very well in my opinion. 4/23; the patient arrives in clinic today having napkin stuck on his wounds. He is taking the antibiotic from last week. Last Wednesday he had his INR checked at the Coumadin clinic it was apparently "sky high" and his Coumadin is now on hold.  He is going back to check this again on Monday. His legs were not put back in compression last week out of fear of the extending infection with fever. He has been taking the antibiotics. Culture from last week showed Enterobacter and methicillin resistant staph aureus. He is on on doxycycline which the MRSA was sensitive to Enterobacter was not plated but given the pattern of sensitivities probably the doxycycline was satisfactory. I will extend the doxycycline another 4 days until he is seen next week. From an infection point of view this looks better 06/14/2019 upon evaluation today patient unfortunately continues to have signs of cellulitis on the  lower extremities bilaterally especially on the left. Unfortunately he doesn't seem to be doing much better despite being on doxycycline for a couple of weeks. He was positive for MRSA as well as Enterobacter. Obviously the MRSA is treated by way of the doxycycline the Enterobacter unfortunately is not I'm going to see about initiating Bactrim for him at this point. Electronic Signature(s) Signed: 06/14/2019 5:34:27 PM By: Worthy Keeler PA-C Entered By: Worthy Keeler on 06/14/2019 17:34:27 XZAVIAN, SEMMEL ([id number]) JAYESH, MARBACH ([id number]) -------------------------------------------------------------------------------- Physical Exam Details Patient Name: [name] [name] Date of Service: 06/14/2019 3:30 PM Medical Record Number: [id number] Patient Account Number: [id number] Date of Birth/Sex: 1960/10/17 (59 y.o. M) Treating RN: Army Melia Primary Care Provider: Laverna Peace Other Clinician: Referring Provider: Laverna Peace Treating Provider/Extender: STONE III, Scout Gumbs Weeks in Treatment: 5 Constitutional Well-nourished and well-hydrated in no acute distress. Respiratory normal breathing without difficulty. Psychiatric this patient is able to make decisions and demonstrates good insight into disease process. Alert and Oriented x 3. pleasant and cooperative. Notes Upon inspection patient's wounds unfortunately still continue to show signs of significant erythema surrounding I'm really not pleased today with the way things stand consider needs been on antibiotics for around 2 weeks now. No sharp debridement performed today. Electronic Signature(s) Signed: 06/14/2019 5:34:48 PM By: Worthy Keeler PA-C Entered By: Worthy Keeler on 06/14/2019 17:34:48 [name] [name] ([id number]) -------------------------------------------------------------------------------- Physician Orders Details Patient Name: [name] [name] Date of Service: 06/14/2019 3:30 PM Medical Record  Number: [id number] Patient Account Number: [id number] Date of Birth/Sex: 04-03-1960 (59 y.o. M) Treating RN: Army Melia Primary Care Provider: Laverna Peace Other Clinician: Referring Provider: Laverna Peace Treating Provider/Extender: Melburn Hake, Gilberte Gorley Weeks in Treatment: 5 Verbal / Phone Orders: No Diagnosis Coding ICD-10 Coding Code Description I87.2 Venous insufficiency (chronic) (peripheral) I89.0 Lymphedema, not elsewhere classified L97.812 Non-pressure chronic ulcer of other part of right lower leg with fat layer exposed L97.822 Non-pressure chronic ulcer of other part of left lower leg with fat layer exposed [place] (primary) hypertension J44.9 Chronic obstructive pulmonary disease, unspecified Z79.01 Long term (current) use of anticoagulants I48.0 Paroxysmal atrial fibrillation Z86.711 Personal history of pulmonary embolism I50.42 Chronic combined systolic (congestive) and diastolic (congestive) heart failure Wound Cleansing Wound #3 Right,Proximal,Lateral Lower Leg o Cleanse wound with mild soap and water o May shower with protection. Wound #5 Left,Lateral Lower Leg o Cleanse wound with mild soap and water o May shower with protection. Wound #7 Right,Anterior Lower Leg o Cleanse wound with mild soap and water o May shower with protection. Primary Wound Dressing Wound #3 Right,Proximal,Lateral Lower Leg o Silver Alginate Wound #5 Left,Lateral Lower Leg o Silver Alginate Wound #7 Right,Anterior Lower Leg o Silver Alginate Secondary Dressing Wound #3 Right,Proximal,Lateral Lower Leg o XtraSorb - where needed Wound #5 Left,Lateral Lower Leg o XtraSorb - where needed Wound #7 Right,Anterior Lower Leg o  XtraSorb - where needed Dressing Change Frequency Wound #3 Right,Proximal,Lateral Lower Leg o Other: - twice weekly Wound #5 Left,Lateral Lower Leg o Other: - twice weekly Zulueta, Chrissie Noa ([id number]) Wound #7  Right,Anterior Lower Leg o Other: - twice weekly Follow-up Appointments Wound #3 Right,Proximal,Lateral Lower Leg o Return Appointment in 1 week. Wound #5 Left,Lateral Lower Leg o Return Appointment in 1 week. Wound #7 Right,Anterior Lower Leg o Return Appointment in 1 week. Edema Control Wound #3 Right,Proximal,Lateral Lower Leg o Unna Boots Bilaterally o Elevate legs to the level of the heart and pump ankles as often as possible Wound #5 Left,Lateral Lower Leg o Unna Boots Bilaterally o Elevate legs to the level of the heart and pump ankles as often as possible Wound #7 Right,Anterior Lower Leg o Unna Boots Bilaterally o Elevate legs to the level of the heart and pump ankles as often as possible Consults o Infectious Disease - Referral due to persistent cellulitis despite oral therapy directed at cultures Electronic Signature(s) Signed: 06/14/2019 5:56:59 PM By: Worthy Keeler PA-C Previous Signature: 06/14/2019 4:32:24 PM Version By: Army Melia Entered By: Worthy Keeler on 06/14/2019 17:37:43 [name] [name] ([id number]) -------------------------------------------------------------------------------- Problem List Details Patient Name: [name] [name] Date of Service: 06/14/2019 3:30 PM Medical Record Number: [id number] Patient Account Number: [id number] Date of Birth/Sex: 13-Aug-1960 (59 y.o. M) Treating RN: Army Melia Primary Care Provider: Laverna Peace Other Clinician: Referring Provider: Laverna Peace Treating Provider/Extender: Melburn Hake, Tyshea Imel Weeks in Treatment: 5 Active Problems ICD-10 Encounter Code Description Active Date MDM Diagnosis I87.2 Venous insufficiency (chronic) (peripheral) 05/04/2019 No Yes I89.0 Lymphedema, not elsewhere classified 05/04/2019 No Yes L97.812 Non-pressure chronic ulcer of other part of right lower leg with fat layer 05/04/2019 No Yes exposed L97.822 Non-pressure chronic ulcer of other part of left  lower leg with fat layer 05/04/2019 No Yes exposed [place] (primary) hypertension 05/04/2019 No Yes J44.9 Chronic obstructive pulmonary disease, unspecified 05/04/2019 No Yes Z79.01 Long term (current) use of anticoagulants 05/04/2019 No Yes I48.0 Paroxysmal atrial fibrillation 05/04/2019 No Yes Z86.711 Personal history of pulmonary embolism 05/04/2019 No Yes I50.42 Chronic combined systolic (congestive) and diastolic (congestive) heart 05/04/2019 No Yes failure Inactive Problems Resolved Problems Electronic Signature(s) Signed: 06/14/2019 3:17:37 PM By: Vertis Kelch, Chrissie Noa ([id number]) Entered By: Worthy Keeler on 06/14/2019 15:17:36 [name] [name] ([id number]) -------------------------------------------------------------------------------- Progress Note Details Patient Name: [name] [name] Date of Service: 06/14/2019 3:30 PM Medical Record Number: [id number] Patient Account Number: [id number] Date of Birth/Sex: 1960/10/01 (59 y.o. M) Treating RN: Army Melia Primary Care Provider: Laverna Peace Other Clinician: Referring Provider: Laverna Peace Treating Provider/Extender: Melburn Hake, Shyan Scalisi Weeks in Treatment: 5 Subjective Chief Complaint Information obtained from Patient Bilateral LE ulcers History of Present Illness (HPI) 01/18/2019 on evaluation today patient presents for initial evaluation here in our clinic concerning issues he is been having with his bilateral lower extremities. He appears based on what I am seeing to have bilateral lower extremity cellulitis along with chronic venous stasis. He has significant pitting edema even in the bottom of his foot. With that being said he also has a history of hypertension, COPD, congestive heart failure, long-term use of anticoagulants due to atrial fibrillation which is paroxysmal as well as having had a pulmonary embolism in the past. With that being said he tells me that he has constant drainage out of  his left lower extremity. This is likely due to the fact that again he has significant swelling pretty much all the time. With that being  said in the past 2 weeks this has been getting worse and he tells me at this point that he feels like it is infected is also hurting a lot more than it has been in the past and the drainage is a different color. The left side is definitely worse than the right. Fortunately there is no signs of active infection systemically although I am concerned due to the severity of the infection that I see in the left lower extremity especially. There is a lot of necrotic tissue on the surface of both wounds that at some point is going to need to be cleaned off to allow these to heal but right now obviously this is much too tender for him. No fevers, chills, nausea, vomiting, or diarrhea. 02/02/2019 on evaluation today patient presents for follow-up concerning the wounds over his bilateral lower extremities. He is continuing to manage. He was in the hospital and has been discharged at this point. He was admitted from 01/21/2019 through 01/27/2019. This was due to cellulitis of lower extremity. Nonetheless he does seem to be doing much better at this point. I did review his discharge summary as well he did not appear to have any positive findings on blood cultures. His white blood cell count was never really elevated either which is also good news. He was treated with IV clindamycin for 4 days and then switch to complete a 10-day course at home which she is still working on at this point. They did have him on IV Lasix in the hospital which helped him diurese well on discharge they stated that his weight was 408 pounds which is still higher than his dry weight of 375 to 380 pounds. Upon discharge his torsemide was increased from 40 to 60 mg twice daily and he is to follow-up with cardiology as well. 12/29-Patient returns at 2 weeks for evaluation and follow-up of his bilateral lower  extremity wounds, with lymphedema, venous ulcerations, patient was recently hospitalized and was aggressively diuresed after which his torsemide has been increased. He follows up with cardiology as well. His left leg stasis wounds are more moist and have maceration of tissue, the right one is less so. We are using silver cell and unna compression given the degree of edema in the legs. Readmission: 05/04/2019 patient presents today for reevaluation here in the clinic I have not seen him since December 2020. Subsequently this is a fairly sick individual with multiple comorbidities who presents for follow-up concerning wounds of his bilateral lower extremities. He tells me that after he was in the hospital following when we saw him last in December that he ended up subsequently being discharged with home health services and they have been coming out and applying Unna boot wraps. With that being said he has used up all of his home health visits for the year and then for the last week has not had anything on his legs as far as the wrap is concerned. Obviously this is not good and he does come in today with increased swelling or at least what I perceived to be along with somewhat dry wounds again he does not seem to be weeping too much at this point which is at least good news but he has significant lymphedema/venous stasis. No fevers, chills, nausea, vomiting, or diarrhea. 06/01/2019 upon evaluation today patient is seen after not having been seen actually since 19 2021. He kept coming for appointment with every time he would come his temperature would be elevated past  the point where we could see him. I told him that he needed to go be seen at the ER during 1 of these times due to the fact that obviously this was a fever of unknown origin at that point and my concern was that it could potentially be his legs but again I was not entirely sure as obviously I could not see them due to the restrictions  secondary to Covid even bringing the patient into the clinic. Nonetheless he finally came in today his temperature again was elevated but not to the same degree that it met the 100.4 or above guidelines of having to cancel the appointment. Either way we did have a look at his legs unfortunately he appears to show signs of cellulitis at this point which is probably where the temperature elevations are emanating from. He has significant lymphedema as well as chronic vent venous stasis as well. When I initially told him that is why I told him to go to the ER previously when his temperature was elevated in case it was something with wounds or otherwise he replied "nobody ever told me to go to the ER". I then subsequently reminded him of me coming to the front desk due to his temperature being elevated and advising him to go to the ER to which she replied "I am not going to the ER and I do not think I need to even with this". Nonetheless I am concerned about the infection of his legs at this point he has significant weeping, significant edema, and overall is not doing very well in my opinion. 4/23; the patient arrives in clinic today having napkin stuck on his wounds. He is taking the antibiotic from last week. Last Wednesday he had his INR checked at the Coumadin clinic it was apparently "sky high" and his Coumadin is now on hold. He is going back to check this again on Monday. His legs were not put back in compression last week out of fear of the extending infection with fever. He has been taking the antibiotics. Culture from last week showed Enterobacter and methicillin resistant staph aureus. He is on on doxycycline which the MRSA was sensitive to Enterobacter was not plated but given the pattern of sensitivities probably the doxycycline was satisfactory. I will extend the doxycycline another 4 days until he is seen next week. From an infection point of view this looks better 06/14/2019 upon evaluation  today patient unfortunately continues to have signs of cellulitis on the lower extremities bilaterally especially on the left. Unfortunately he doesn't seem to be doing much better despite being on doxycycline for a couple of weeks. He was positive for MRSA as well as Enterobacter. Obviously the MRSA is treated by way of the doxycycline the Enterobacter unfortunately is not I'm going to see about initiating Bactrim for him at this point. MILOS, MILLIGAN ([id number]) Objective Constitutional Well-nourished and well-hydrated in no acute distress. Vitals Time Taken: 3:25 PM, Height: 68 in, Weight: 400 lbs, BMI: 60.8, Temperature: 99.6 F, Pulse: 79 bpm, Respiratory Rate: 18 breaths/min, Blood Pressure: 140/69 mmHg. Respiratory normal breathing without difficulty. Psychiatric this patient is able to make decisions and demonstrates good insight into disease process. Alert and Oriented x 3. pleasant and cooperative. General Notes: Upon inspection patient's wounds unfortunately still continue to show signs of significant erythema surrounding I'm really not pleased today with the way things stand consider needs been on antibiotics for around 2 weeks now. No sharp debridement performed today. Integumentary (Hair,  Skin) Wound #3 status is Open. Original cause of wound was Gradually Appeared. The wound is located on the Right,Proximal,Lateral Lower Leg. The wound measures 0.4cm length x 0.4cm width x 0.1cm depth; 0.126cm^2 area and 0.013cm^3 volume. There is Fat Layer (Subcutaneous Tissue) Exposed exposed. There is no tunneling or undermining noted. There is a medium amount of serous drainage noted. The wound margin is flat and intact. There is medium (34-66%) pink granulation within the wound bed. There is a medium (34-66%) amount of necrotic tissue within the wound bed including Adherent Slough. Wound #5 status is Open. Original cause of wound was Gradually Appeared. The wound is located on the  Left,Lateral Lower Leg. The wound measures 5cm length x 8cm width x 0.1cm depth; 31.416cm^2 area and 3.142cm^3 volume. There is Fat Layer (Subcutaneous Tissue) Exposed exposed. There is no tunneling or undermining noted. There is a medium amount of serous drainage noted. The wound margin is flat and intact. There is medium (34-66%) pink granulation within the wound bed. There is a medium (34-66%) amount of necrotic tissue within the wound bed including Adherent Slough. Wound #6 status is Healed - Epithelialized. Original cause of wound was Gradually Appeared. The wound is located on the Left,Posterior Lower Leg. The wound measures 0cm length x 0cm width x 0cm depth; 0cm^2 area and 0cm^3 volume. Wound #7 status is Open. Original cause of wound was Gradually Appeared. The wound is located on the Right,Anterior Lower Leg. The wound measures 5.3cm length x 8.8cm width x 0.1cm depth; 36.631cm^2 area and 3.663cm^3 volume. There is Fat Layer (Subcutaneous Tissue) Exposed exposed. There is no tunneling or undermining noted. There is a medium amount of serosanguineous drainage noted. There is small (1-33%) pink granulation within the wound bed. There is a large (67-100%) amount of necrotic tissue within the wound bed including Adherent Slough. Assessment Active Problems ICD-10 Venous insufficiency (chronic) (peripheral) Lymphedema, not elsewhere classified Non-pressure chronic ulcer of other part of right lower leg with fat layer exposed Non-pressure chronic ulcer of other part of left lower leg with fat layer exposed Essential (primary) hypertension Chronic obstructive pulmonary disease, unspecified Long term (current) use of anticoagulants Paroxysmal atrial fibrillation Personal history of pulmonary embolism Chronic combined systolic (congestive) and diastolic (congestive) heart failure Procedures LEBARON, BAUTCH ([id number]) Wound #3 Pre-procedure diagnosis of Wound #3 is a Diabetic Wound/Ulcer  of the Lower Extremity located on the Right,Proximal,Lateral Lower Leg . There was a Haematologist Compression Therapy Procedure by Army Melia, RN. Post procedure Diagnosis Wound #3: Same as Pre-Procedure Wound #5 Pre-procedure diagnosis of Wound #5 is a Lymphedema located on the Left,Lateral Lower Leg . There was a Haematologist Compression Therapy Procedure by Army Melia, RN. Post procedure Diagnosis Wound #5: Same as Pre-Procedure Wound #7 Pre-procedure diagnosis of Wound #7 is a Venous Leg Ulcer located on the Right,Anterior Lower Leg . There was a Haematologist Compression Therapy Procedure by Army Melia, RN. Post procedure Diagnosis Wound #7: Same as Pre-Procedure Plan Wound Cleansing: Wound #3 Right,Proximal,Lateral Lower Leg: Cleanse wound with mild soap and water May shower with protection. Wound #5 Left,Lateral Lower Leg: Cleanse wound with mild soap and water May shower with protection. Wound #7 Right,Anterior Lower Leg: Cleanse wound with mild soap and water May shower with protection. Primary Wound Dressing: Wound #3 Right,Proximal,Lateral Lower Leg: Silver Alginate Wound #5 Left,Lateral Lower Leg: Silver Alginate Wound #7 Right,Anterior Lower Leg: Silver Alginate Secondary Dressing: Wound #3 Right,Proximal,Lateral Lower Leg: XtraSorb - where needed Wound #5 Left,Lateral Lower  Leg: XtraSorb - where needed Wound #7 Right,Anterior Lower Leg: XtraSorb - where needed Dressing Change Frequency: Wound #3 Right,Proximal,Lateral Lower Leg: Other: - twice weekly Wound #5 Left,Lateral Lower Leg: Other: - twice weekly Wound #7 Right,Anterior Lower Leg: Other: - twice weekly Follow-up Appointments: Wound #3 Right,Proximal,Lateral Lower Leg: Return Appointment in 1 week. Wound #5 Left,Lateral Lower Leg: Return Appointment in 1 week. Wound #7 Right,Anterior Lower Leg: Return Appointment in 1 week. Edema Control: Wound #3 Right,Proximal,Lateral Lower Leg: Unna Boots  Bilaterally Elevate legs to the level of the heart and pump ankles as often as possible Wound #5 Left,Lateral Lower Leg: Unna Boots Bilaterally Elevate legs to the level of the heart and pump ankles as often as possible Wound #7 Right,Anterior Lower Leg: Unna Boots Bilaterally Elevate legs to the level of the heart and pump ankles as often as possible Consults ordered were: Infectious Disease - Referral due to persistent cellulitis despite oral therapy directed at cultures Herrings, Chrissie Noa ([id number]) 1. I still suggested to the patient he may need IV antibiotic therapy and that potentially going to the hospital would be best. With that being said he is adamantly opposed to doing so. Discussed the possibility of doing an oral antibiotic such as Bactrim for him with that being said the patient unfortunately has a lot of interactions with his current medications including Coumadin with this and I think that is can make it difficult. I Minna reach out him tomorrow to let them know what is going on in that regard I really think that considering he is not responding well to the oral antibiotics I'm to make a referral to infectious disease which I think will be a much better way to go. 2. In the meantime we will continue with the compression wrap. This is an Pension scheme manager that does seem to at least help some with edema control though he still is draining significantly. Were also using the silver alginate dressing at this point. We will see patient back for reevaluation in 1 week here in the clinic. If anything worsens or changes patient will contact our office for additional recommendations. Electronic Signature(s) Signed: 06/14/2019 5:38:08 PM By: Worthy Keeler PA-C Entered By: Worthy Keeler on 06/14/2019 17:38:08 MUHANNAD, BIGNELL ([id number]) -------------------------------------------------------------------------------- SuperBill Details Patient Name: [name] [name] Date of Service:  06/14/2019 Medical Record Number: [id number] Patient Account Number: [id number] Date of Birth/Sex: October 22, 1960 (59 y.o. M) Treating RN: Army Melia Primary Care Provider: Laverna Peace Other Clinician: Referring Provider: Laverna Peace Treating Provider/Extender: Melburn Hake, Tabetha Haraway Weeks in Treatment: 5 Diagnosis Coding ICD-10 Codes Code Description I87.2 Venous insufficiency (chronic) (peripheral) I89.0 Lymphedema, not elsewhere classified L97.812 Non-pressure chronic ulcer of other part of right lower leg with fat layer exposed L97.822 Non-pressure chronic ulcer of other part of left lower leg with fat layer exposed I10 Essential (primary) hypertension J44.9 Chronic obstructive pulmonary disease, unspecified Z79.01 Long term (current) use of anticoagulants I48.0 Paroxysmal atrial fibrillation Z86.711 Personal history of pulmonary embolism I50.42 Chronic combined systolic (congestive) and diastolic (congestive) heart failure Facility Procedures CPT4 Code: 40768088 Description: 29580 - APPLY UNNA BOOT/PROFO BILATERAL Modifier: Quantity: 1 Physician Procedures CPT4 Code: 1103159 Description: 99214 - WC PHYS LEVEL 4 - EST PT Modifier: Quantity: 1 CPT4 Code: Description: ICD-10 Diagnosis Description I87.2 Venous insufficiency (chronic) (peripheral) I89.0 Lymphedema, not elsewhere classified L97.812 Non-pressure chronic ulcer of other part of right lower leg with fat la L97.822 Non-pressure chronic ulcer of  other part of left lower leg with fat lay Modifier:  yer exposed er exposed Quantity: Electronic Signature(s) Signed: 06/14/2019 5:38:23 PM By: Worthy Keeler PA-C Previous Signature: 06/14/2019 4:32:24 PM Version By: Army Melia Entered By: Worthy Keeler on 06/14/2019 17:38:23

## 2019-06-17 DIAGNOSIS — G4733 Obstructive sleep apnea (adult) (pediatric): Secondary | ICD-10-CM | POA: Diagnosis not present

## 2019-06-18 ENCOUNTER — Ambulatory Visit: Payer: Self-pay | Admitting: *Deleted

## 2019-06-18 DIAGNOSIS — L97811 Non-pressure chronic ulcer of other part of right lower leg limited to breakdown of skin: Secondary | ICD-10-CM | POA: Diagnosis not present

## 2019-06-18 DIAGNOSIS — I872 Venous insufficiency (chronic) (peripheral): Secondary | ICD-10-CM | POA: Diagnosis not present

## 2019-06-18 DIAGNOSIS — J449 Chronic obstructive pulmonary disease, unspecified: Secondary | ICD-10-CM

## 2019-06-18 DIAGNOSIS — G8194 Hemiplegia, unspecified affecting left nondominant side: Secondary | ICD-10-CM | POA: Diagnosis not present

## 2019-06-18 DIAGNOSIS — G894 Chronic pain syndrome: Secondary | ICD-10-CM | POA: Diagnosis not present

## 2019-06-18 DIAGNOSIS — E785 Hyperlipidemia, unspecified: Secondary | ICD-10-CM | POA: Diagnosis not present

## 2019-06-18 DIAGNOSIS — E662 Morbid (severe) obesity with alveolar hypoventilation: Secondary | ICD-10-CM | POA: Diagnosis not present

## 2019-06-18 DIAGNOSIS — I2510 Atherosclerotic heart disease of native coronary artery without angina pectoris: Secondary | ICD-10-CM | POA: Diagnosis not present

## 2019-06-18 DIAGNOSIS — I4819 Other persistent atrial fibrillation: Secondary | ICD-10-CM | POA: Diagnosis not present

## 2019-06-18 DIAGNOSIS — I5022 Chronic systolic (congestive) heart failure: Secondary | ICD-10-CM | POA: Diagnosis not present

## 2019-06-18 DIAGNOSIS — M10041 Idiopathic gout, right hand: Secondary | ICD-10-CM | POA: Diagnosis not present

## 2019-06-18 DIAGNOSIS — E1151 Type 2 diabetes mellitus with diabetic peripheral angiopathy without gangrene: Secondary | ICD-10-CM | POA: Diagnosis not present

## 2019-06-18 DIAGNOSIS — Z86718 Personal history of other venous thrombosis and embolism: Secondary | ICD-10-CM | POA: Diagnosis not present

## 2019-06-18 DIAGNOSIS — Z6841 Body Mass Index (BMI) 40.0 and over, adult: Secondary | ICD-10-CM | POA: Diagnosis not present

## 2019-06-18 DIAGNOSIS — G510 Bell's palsy: Secondary | ICD-10-CM | POA: Diagnosis not present

## 2019-06-18 DIAGNOSIS — D649 Anemia, unspecified: Secondary | ICD-10-CM | POA: Diagnosis not present

## 2019-06-18 DIAGNOSIS — G4733 Obstructive sleep apnea (adult) (pediatric): Secondary | ICD-10-CM | POA: Diagnosis not present

## 2019-06-18 DIAGNOSIS — I890 Lymphedema, not elsewhere classified: Secondary | ICD-10-CM | POA: Diagnosis not present

## 2019-06-18 DIAGNOSIS — Z792 Long term (current) use of antibiotics: Secondary | ICD-10-CM | POA: Diagnosis not present

## 2019-06-18 DIAGNOSIS — L97821 Non-pressure chronic ulcer of other part of left lower leg limited to breakdown of skin: Secondary | ICD-10-CM | POA: Diagnosis not present

## 2019-06-18 DIAGNOSIS — F321 Major depressive disorder, single episode, moderate: Secondary | ICD-10-CM | POA: Diagnosis not present

## 2019-06-18 DIAGNOSIS — Z7901 Long term (current) use of anticoagulants: Secondary | ICD-10-CM | POA: Diagnosis not present

## 2019-06-18 DIAGNOSIS — M1611 Unilateral primary osteoarthritis, right hip: Secondary | ICD-10-CM | POA: Diagnosis not present

## 2019-06-18 DIAGNOSIS — I110 Hypertensive heart disease with heart failure: Secondary | ICD-10-CM | POA: Diagnosis not present

## 2019-06-18 DIAGNOSIS — M545 Low back pain: Secondary | ICD-10-CM | POA: Diagnosis not present

## 2019-06-18 NOTE — Chronic Care Management (AMB) (Signed)
  Chronic Care Management   Social Work Note  06/18/2019 Name: [name] [name] MRN: [id number] DOB: Apr 16, 1960  [name] [name] is a 59 y.o. year old male who sees Flinchum, [name], FNP for primary care. The CCM team was consulted for assistance with [name] .   Phone call to [facility] to follow up on status of patient's personal care services. Per [name], patient has declined appeal and is wanted to a new evaluation done for personal care services. The [facility] 3051 request for independent assessment for personal care services has been faxed to patient's provider to complete. Once received, a new evaluation for personal care services will be scheduled.  SDOH (Social Determinants of Health) assessments performed: No     Outpatient Encounter Medications as of 06/18/2019  Medication Sig  . albuterol (VENTOLIN HFA) 108 (90 Base) MCG/ACT inhaler Inhale 2 puffs into the lungs every 6 (six) hours as needed for wheezing or shortness of breath.  . allopurinol (ZYLOPRIM) 100 MG tablet Take 1 tablet (100 mg total) by mouth daily.  [name] atorvastatin (LIPITOR) 40 MG tablet TAKE 1 TABLET BY MOUTH ONCE DAILY  . colchicine 0.6 MG tablet 1 tab PO q 12 hrs as needed until gout flare subsides, schedule on office follow up  . DULoxetine (CYMBALTA) 20 MG capsule Take 1 capsule (20 mg total) by mouth daily.  . ferrous sulfate 325 (65 FE) MG tablet Take 1 tablet (325 mg total) by mouth 2 (two) times daily with a meal.  . gabapentin (NEURONTIN) 300 MG capsule Take 1 capsule (300 mg total) by mouth 3 (three) times daily.  [name] levothyroxine (SYNTHROID) 50 MCG tablet Take 1 tablet (50 mcg total) by mouth daily before breakfast. Need lab work beginning of June for Thyroid  . lisinopril (ZESTRIL) 2.5 MG tablet Take 1 tablet (2.5 mg total) by mouth daily.  . metoprolol succinate (TOPROL-XL) 50 MG 24 hr tablet Take 1 tablet (50 mg total) by mouth daily. Take with or immediately following a meal.  .  nitroGLYCERIN (NITROSTAT) 0.4 MG SL tablet Place 1 tablet under tongue every 5 minutes as needed for chest pain. (No more than 3 doses within 15 minutes)  . torsemide (DEMADEX) 20 MG tablet Take 3 tablets (60 mg total) by mouth 2 (two) times daily.  . traZODone (DESYREL) 150 MG tablet TAKE 1 TABLET BY MOUTH AT BEDTIME AS NEEDED SLEEP  . warfarin (COUMADIN) 5 MG tablet Take by mouth daily as directed by the anti-coag clinic   No facility-administered encounter medications on file as of 06/18/2019.    Goals Addressed   None     Follow Up Plan: SW will follow up with patient by phone over the next 7-10 business days   Undra Trembath, [place] Clinical Social Worker  [facility] Family Practice/[facility] Care Management [phone]

## 2019-06-18 NOTE — Patient Instructions (Addendum)
Thank you allowing the Chronic Care Management Team to be a part of your care! It was a pleasure speaking with you today!  1. Please continue to follow up with your provider regarding paperwork needed to resume in home services  CCM (Chronic Care Management) Team   Juanell Fairly RN, BSN Nurse Care Coordinator  [phone]  Aleea Hendry [address] LCSW Clinical Social Worker [phone]  Goals Addressed            This Visit's Progress   . "I was denied more aid service, I need a aid (pt-stated)       CARE PLAN ENTRY (see longtitudinal plan of care for additional care plan information)  Current Barriers:  . ADL IADL limitations  Clinical Social Work Clinical Goal(s):  [name] Over the next 90 days, patient will work with [facility] to address needs related to be denied continued in home aid services  Interventions: . Follow up phone call regarding re-evaluation for personal care services . Patient confirmed that a nurse did come from [facility] to re-evaluate him and has been approved for 80 hours per week, however Blase Mess is waiting on some paperwork from his provider's office.  . Patient confirmed calling the doctor's office, however is not sure at this time about the status. . Social worker confirmed plan to follow up with [facility] care and/or MD office to follow up on what is required to restore patient's personal care services. . Discussed plans with patient for ongoing care management follow up and provided patient with direct contact information for care management team   Patient Self Care Activities:  . Patient verbalizes understanding of plan to contact [employer] to file an appeal for in home services . Unable to perform ADLs independently  Please see past updates related to this goal by clicking on the "Past Updates" button in the selected goal          The patient verbalized understanding of instructions provided today and declined a  print copy of patient instruction materials.   Telephone follow up appointment with care management team member scheduled for: 06/25/19

## 2019-06-18 NOTE — Chronic Care Management (AMB) (Signed)
   Chronic Care Management   Unsuccessful Call Note 06/18/2019 Name: [name] [name] MRN: [id number] DOB: Nov 27, 1960  Patient  is a 59 year old male who sees Marvell Fuller, FNP for primary care. Per patient, he has been approved for personal care services, however there is paperwork that needs to come from his provider's office. Phone call made to [facility] care for clarification.      This social worker was unable to reach [facility] via telephone today to follow up on patient's hone care. I have left HIPAA compliant voicemail asking patient to return my call.   Plan: Will follow-up within 7 business days via telephone.      Verna Czech, LCSW Clinical Social Worker  [facility] Family Practice/[facility] Care Management [phone]

## 2019-06-18 NOTE — Chronic Care Management (AMB) (Signed)
Chronic Care Management    Clinical Social Work Follow Up Note  06/18/2019 Name: [name] [name] MRN: [id number] DOB: [birth year]  [name] [name] is a 59 y.o. year old male who is a primary care patient of Flinchum, Kelby Aline, FNP. The CCM team was consulted for assistance with [name] .   Review of patient status, including review of consultants reports, other relevant assessments, and collaboration with appropriate care team members and the patient's provider was performed as part of comprehensive patient evaluation and provision of chronic care management services.    SDOH (Social Determinants of Health) assessments performed: No    Outpatient Encounter Medications as of 06/18/2019  Medication Sig  . albuterol (VENTOLIN HFA) 108 (90 Base) MCG/ACT inhaler Inhale 2 puffs into the lungs every 6 (six) hours as needed for wheezing or shortness of breath.  . allopurinol (ZYLOPRIM) 100 MG tablet Take 1 tablet (100 mg total) by mouth daily.  [name] atorvastatin (LIPITOR) 40 MG tablet TAKE 1 TABLET BY MOUTH ONCE DAILY  . colchicine 0.6 MG tablet 1 tab PO q 12 hrs as needed until gout flare subsides, schedule on office follow up  . DULoxetine (CYMBALTA) 20 MG capsule Take 1 capsule (20 mg total) by mouth daily.  . ferrous sulfate 325 (65 FE) MG tablet Take 1 tablet (325 mg total) by mouth 2 (two) times daily with a meal.  . gabapentin (NEURONTIN) 300 MG capsule Take 1 capsule (300 mg total) by mouth 3 (three) times daily.  [name] levothyroxine (SYNTHROID) 50 MCG tablet Take 1 tablet (50 mcg total) by mouth daily before breakfast. Need lab work beginning of June for Thyroid  . lisinopril (ZESTRIL) 2.5 MG tablet Take 1 tablet (2.5 mg total) by mouth daily.  . metoprolol succinate (TOPROL-XL) 50 MG 24 hr tablet Take 1 tablet (50 mg total) by mouth daily. Take with or immediately following a meal.  . nitroGLYCERIN (NITROSTAT) 0.4 MG SL tablet Place 1 tablet under tongue every 5 minutes as needed for  chest pain. (No more than 3 doses within 15 minutes)  . torsemide (DEMADEX) 20 MG tablet Take 3 tablets (60 mg total) by mouth 2 (two) times daily.  . traZODone (DESYREL) 150 MG tablet TAKE 1 TABLET BY MOUTH AT BEDTIME AS NEEDED SLEEP  . warfarin (COUMADIN) 5 MG tablet Take by mouth daily as directed by the anti-coag clinic   No facility-administered encounter medications on file as of 06/18/2019.     Goals Addressed            This Visit's Progress   . "I was denied more aid service, I need a aid (pt-stated)       CARE PLAN ENTRY (see longtitudinal plan of care for additional care plan information)  Current Barriers:  . ADL IADL limitations  Clinical Social Work Clinical Goal(s):  [name] Over the next 90 days, patient will work with [facility] to address needs related to be denied continued in home aid services  Interventions: . Follow up phone call regarding re-evaluation for personal care services . Spoke with Leo Grosser from [facility] who confirmed that patient decided against filing an appeal for in home care services, however has decided to request a new evaluation to take place which will require a new request form be completed by patient's doctor . Message sent to patient's doctor and confirmed that the form has been completed and returned to [facility] for review . Discussed plans with patient for ongoing care  management follow up and provided patient with direct contact information for care management team   Patient Self Care Activities:  . Patient verbalizes understanding of plan to contact [employer] to file an appeal for in home services . Unable to perform ADLs independently  Please see past updates related to this goal by clicking on the "Past Updates" button in the selected goal          Follow Up Plan: SW will follow up with patient by phone over the next 7-14 business days    [place], [place] Clinical Social Worker    [facility] Family Practice/[facility] Care Management [phone]

## 2019-06-18 NOTE — Chronic Care Management (AMB) (Signed)
Care Management    Clinical Social Work Follow Up Note  06/18/2019 Name: [name] [name] MRN: [id number] DOB: 11-23-1960  [name] [name] is a 59 y.o. year old male who is a primary care patient of Flinchum, Kelby Aline, FNP. The CCM team was consulted for assistance with [name] .   Review of patient status, including review of consultants reports, other relevant assessments, and collaboration with appropriate care team members and the patient's provider was performed as part of comprehensive patient evaluation and provision of chronic care management services.    SDOH (Social Determinants of Health) assessments performed: No    Outpatient Encounter Medications as of 06/15/2019  Medication Sig  . albuterol (VENTOLIN HFA) 108 (90 Base) MCG/ACT inhaler Inhale 2 puffs into the lungs every 6 (six) hours as needed for wheezing or shortness of breath.  . allopurinol (ZYLOPRIM) 100 MG tablet Take 1 tablet (100 mg total) by mouth daily.  [name] atorvastatin (LIPITOR) 40 MG tablet TAKE 1 TABLET BY MOUTH ONCE DAILY  . colchicine 0.6 MG tablet 1 tab PO q 12 hrs as needed until gout flare subsides, schedule on office follow up  . DULoxetine (CYMBALTA) 20 MG capsule Take 1 capsule (20 mg total) by mouth daily.  . ferrous sulfate 325 (65 FE) MG tablet Take 1 tablet (325 mg total) by mouth 2 (two) times daily with a meal.  . gabapentin (NEURONTIN) 300 MG capsule Take 1 capsule (300 mg total) by mouth 3 (three) times daily.  [name] levothyroxine (SYNTHROID) 50 MCG tablet Take 1 tablet (50 mcg total) by mouth daily before breakfast. Need lab work beginning of June for Thyroid  . lisinopril (ZESTRIL) 2.5 MG tablet Take 1 tablet (2.5 mg total) by mouth daily.  . metoprolol succinate (TOPROL-XL) 50 MG 24 hr tablet Take 1 tablet (50 mg total) by mouth daily. Take with or immediately following a meal.  . nitroGLYCERIN (NITROSTAT) 0.4 MG SL tablet Place 1 tablet under tongue every 5 minutes as needed for chest  pain. (No more than 3 doses within 15 minutes)  . torsemide (DEMADEX) 20 MG tablet Take 3 tablets (60 mg total) by mouth 2 (two) times daily.  . traZODone (DESYREL) 150 MG tablet TAKE 1 TABLET BY MOUTH AT BEDTIME AS NEEDED SLEEP  . warfarin (COUMADIN) 5 MG tablet Take by mouth daily as directed by the anti-coag clinic   No facility-administered encounter medications on file as of 06/15/2019.     Goals Addressed            This Visit's Progress   . "I was denied more aid service, I need a aid (pt-stated)       CARE PLAN ENTRY (see longtitudinal plan of care for additional care plan information)  Current Barriers:  . ADL IADL limitations  Clinical Social Work Clinical Goal(s):  [name] Over the next 90 days, patient will work with [facility] to address needs related to be denied continued in home aid services  Interventions: . Follow up phone call regarding re-evaluation for personal care services . Patient confirmed that a nurse did come from [facility] to re-evaluate him and has been approved for 80 hours per week, however Caroleen Hamman is waiting on some paperwork from his provider's office.  . Patient confirmed calling the doctor's office, however is not sure at this time about the status. . Social worker confirmed plan to follow up with [place] care and/or MD office to follow up on what is required to restore  patient's personal care services. . Discussed plans with patient for ongoing care management follow up and provided patient with direct contact information for care management team   Patient Self Care Activities:  . Patient verbalizes understanding of plan to contact [employer] to file an appeal for in home services . Unable to perform ADLs independently  Please see past updates related to this goal by clicking on the "Past Updates" button in the selected goal          Follow Up Plan: SW will follow up with patient by phone over the next 7-10  business days    [place], [place] Clinical Social Worker  [facility] Family Practice/[facility] Care Management [phone]

## 2019-06-19 NOTE — Telephone Encounter (Signed)
Attempted to reach [facility]. Selected option for [place] area and then to speak with a nurse. Went to VM.  Left message to call back.

## 2019-06-20 NOTE — Telephone Encounter (Signed)
Patient coming to office 06/21/2019 will discuss, cardiologist and myself are in agreement he needs home health assistance and labs,INR, wound care at home. We will do alcohol screening on office visit tomorrow as well. I [facility] a [facility] today wanting to [facility] him as he is going to wound care center and they reported he has a Child psychotherapist at [employer].   CCM are you able to assist in anyway ?

## 2019-06-20 NOTE — Telephone Encounter (Signed)
Pt has refused physical therapy but pt is having a nurse come to his home from [facility]. Services are planned for at least 1 month and then they will re evaluate. There are concerns for alcohol abuse

## 2019-06-21 ENCOUNTER — Ambulatory Visit: Payer: Self-pay

## 2019-06-21 ENCOUNTER — Ambulatory Visit: Payer: Self-pay | Admitting: Adult Health

## 2019-06-22 ENCOUNTER — Inpatient Hospital Stay
Admission: EM | Admit: 2019-06-22 | Discharge: 2019-06-26 | DRG: 602 | Disposition: A | Discharge status: Home Health | Payer: Medicaid Other | Attending: Internal Medicine | Admitting: Internal Medicine

## 2019-06-22 ENCOUNTER — Other Ambulatory Visit: Payer: Self-pay

## 2019-06-22 ENCOUNTER — Emergency Department: Payer: Medicaid Other

## 2019-06-22 ENCOUNTER — Encounter: Payer: Self-pay | Admitting: Emergency Medicine

## 2019-06-22 ENCOUNTER — Ambulatory Visit: Payer: Medicaid Other | Admitting: Physician Assistant

## 2019-06-22 DIAGNOSIS — L8990 Pressure ulcer of unspecified site, unspecified stage: Secondary | ICD-10-CM | POA: Insufficient documentation

## 2019-06-22 DIAGNOSIS — R1012 Left upper quadrant pain: Secondary | ICD-10-CM | POA: Diagnosis present

## 2019-06-22 DIAGNOSIS — L89312 Pressure ulcer of right buttock, stage 2: Secondary | ICD-10-CM | POA: Diagnosis not present

## 2019-06-22 DIAGNOSIS — I509 Heart failure, unspecified: Secondary | ICD-10-CM

## 2019-06-22 DIAGNOSIS — I110 Hypertensive heart disease with heart failure: Secondary | ICD-10-CM | POA: Diagnosis present

## 2019-06-22 DIAGNOSIS — B9562 Methicillin resistant Staphylococcus aureus infection as the cause of diseases classified elsewhere: Secondary | ICD-10-CM | POA: Diagnosis present

## 2019-06-22 DIAGNOSIS — R531 Weakness: Secondary | ICD-10-CM | POA: Diagnosis not present

## 2019-06-22 DIAGNOSIS — R0689 Other abnormalities of breathing: Secondary | ICD-10-CM | POA: Diagnosis not present

## 2019-06-22 DIAGNOSIS — R0602 Shortness of breath: Secondary | ICD-10-CM | POA: Diagnosis not present

## 2019-06-22 DIAGNOSIS — I4819 Other persistent atrial fibrillation: Secondary | ICD-10-CM | POA: Diagnosis present

## 2019-06-22 DIAGNOSIS — L03116 Cellulitis of left lower limb: Secondary | ICD-10-CM | POA: Diagnosis present

## 2019-06-22 DIAGNOSIS — F32A Depression, unspecified: Secondary | ICD-10-CM | POA: Diagnosis present

## 2019-06-22 DIAGNOSIS — R778 Other specified abnormalities of plasma proteins: Secondary | ICD-10-CM | POA: Diagnosis not present

## 2019-06-22 DIAGNOSIS — Z20822 Contact with and (suspected) exposure to covid-19: Secondary | ICD-10-CM | POA: Diagnosis present

## 2019-06-22 DIAGNOSIS — E039 Hypothyroidism, unspecified: Secondary | ICD-10-CM | POA: Diagnosis present

## 2019-06-22 DIAGNOSIS — Z79890 Hormone replacement therapy (postmenopausal): Secondary | ICD-10-CM

## 2019-06-22 DIAGNOSIS — I248 Other forms of acute ischemic heart disease: Secondary | ICD-10-CM | POA: Diagnosis present

## 2019-06-22 DIAGNOSIS — I10 Essential (primary) hypertension: Secondary | ICD-10-CM | POA: Diagnosis present

## 2019-06-22 DIAGNOSIS — R7989 Other specified abnormal findings of blood chemistry: Secondary | ICD-10-CM | POA: Diagnosis present

## 2019-06-22 DIAGNOSIS — R000 Tachycardia, unspecified: Secondary | ICD-10-CM | POA: Diagnosis not present

## 2019-06-22 DIAGNOSIS — I2699 Other pulmonary embolism without acute cor pulmonale: Secondary | ICD-10-CM | POA: Diagnosis present

## 2019-06-22 DIAGNOSIS — Z86711 Personal history of pulmonary embolism: Secondary | ICD-10-CM

## 2019-06-22 DIAGNOSIS — R079 Chest pain, unspecified: Secondary | ICD-10-CM | POA: Diagnosis present

## 2019-06-22 DIAGNOSIS — J449 Chronic obstructive pulmonary disease, unspecified: Secondary | ICD-10-CM | POA: Diagnosis not present

## 2019-06-22 DIAGNOSIS — E785 Hyperlipidemia, unspecified: Secondary | ICD-10-CM | POA: Diagnosis not present

## 2019-06-22 DIAGNOSIS — I447 Left bundle-branch block, unspecified: Secondary | ICD-10-CM | POA: Diagnosis present

## 2019-06-22 DIAGNOSIS — Z79899 Other long term (current) drug therapy: Secondary | ICD-10-CM

## 2019-06-22 DIAGNOSIS — Z86718 Personal history of other venous thrombosis and embolism: Secondary | ICD-10-CM

## 2019-06-22 DIAGNOSIS — I2510 Atherosclerotic heart disease of native coronary artery without angina pectoris: Secondary | ICD-10-CM | POA: Diagnosis present

## 2019-06-22 DIAGNOSIS — D509 Iron deficiency anemia, unspecified: Secondary | ICD-10-CM | POA: Diagnosis not present

## 2019-06-22 DIAGNOSIS — F329 Major depressive disorder, single episode, unspecified: Secondary | ICD-10-CM | POA: Diagnosis present

## 2019-06-22 DIAGNOSIS — F411 Generalized anxiety disorder: Secondary | ICD-10-CM | POA: Diagnosis present

## 2019-06-22 DIAGNOSIS — R0789 Other chest pain: Secondary | ICD-10-CM | POA: Diagnosis not present

## 2019-06-22 DIAGNOSIS — L97511 Non-pressure chronic ulcer of other part of right foot limited to breakdown of skin: Secondary | ICD-10-CM | POA: Diagnosis present

## 2019-06-22 DIAGNOSIS — I4891 Unspecified atrial fibrillation: Secondary | ICD-10-CM | POA: Diagnosis not present

## 2019-06-22 DIAGNOSIS — R7982 Elevated C-reactive protein (CRP): Secondary | ICD-10-CM | POA: Diagnosis present

## 2019-06-22 DIAGNOSIS — Z7901 Long term (current) use of anticoagulants: Secondary | ICD-10-CM

## 2019-06-22 DIAGNOSIS — L03119 Cellulitis of unspecified part of limb: Secondary | ICD-10-CM

## 2019-06-22 DIAGNOSIS — L97211 Non-pressure chronic ulcer of right calf limited to breakdown of skin: Secondary | ICD-10-CM | POA: Diagnosis present

## 2019-06-22 DIAGNOSIS — L0390 Cellulitis, unspecified: Secondary | ICD-10-CM | POA: Diagnosis present

## 2019-06-22 DIAGNOSIS — L97221 Non-pressure chronic ulcer of left calf limited to breakdown of skin: Secondary | ICD-10-CM | POA: Diagnosis present

## 2019-06-22 DIAGNOSIS — I5023 Acute on chronic systolic (congestive) heart failure: Secondary | ICD-10-CM

## 2019-06-22 DIAGNOSIS — E662 Morbid (severe) obesity with alveolar hypoventilation: Secondary | ICD-10-CM | POA: Diagnosis present

## 2019-06-22 DIAGNOSIS — J432 Centrilobular emphysema: Secondary | ICD-10-CM | POA: Diagnosis present

## 2019-06-22 DIAGNOSIS — R0902 Hypoxemia: Secondary | ICD-10-CM | POA: Diagnosis not present

## 2019-06-22 DIAGNOSIS — I252 Old myocardial infarction: Secondary | ICD-10-CM

## 2019-06-22 DIAGNOSIS — Z801 Family history of malignant neoplasm of trachea, bronchus and lung: Secondary | ICD-10-CM

## 2019-06-22 DIAGNOSIS — L03115 Cellulitis of right lower limb: Principal | ICD-10-CM

## 2019-06-22 DIAGNOSIS — Z6841 Body Mass Index (BMI) 40.0 and over, adult: Secondary | ICD-10-CM

## 2019-06-22 DIAGNOSIS — I428 Other cardiomyopathies: Secondary | ICD-10-CM | POA: Diagnosis present

## 2019-06-22 DIAGNOSIS — M109 Gout, unspecified: Secondary | ICD-10-CM | POA: Diagnosis present

## 2019-06-22 DIAGNOSIS — Z8249 Family history of ischemic heart disease and other diseases of the circulatory system: Secondary | ICD-10-CM

## 2019-06-22 LAB — COMPREHENSIVE METABOLIC PANEL
ALT: 19 U/L (ref 0–44)
AST: 28 U/L (ref 15–41)
Albumin: 3.1 g/dL — ABNORMAL LOW (ref 3.5–5.0)
Alkaline Phosphatase: 93 U/L (ref 38–126)
Anion gap: 12 (ref 5–15)
BUN: 6 mg/dL (ref 6–20)
CO2: 24 mmol/L (ref 22–32)
Calcium: 7.8 mg/dL — ABNORMAL LOW (ref 8.9–10.3)
Chloride: 103 mmol/L (ref 98–111)
Creatinine, Ser: 0.77 mg/dL (ref 0.61–1.24)
GFR calc Af Amer: >60 mL/min (ref >60)
GFR calc non Af Amer: >60 mL/min (ref >60)
Glucose, Bld: 142 mg/dL — ABNORMAL HIGH (ref 70–99)
Potassium: 3.9 mmol/L (ref 3.5–5.1)
Sodium: 139 mmol/L (ref 135–145)
Total Bilirubin: 0.8 mg/dL (ref 0.3–1.2)
Total Protein: 7.3 g/dL (ref 6.5–8.1)

## 2019-06-22 LAB — CBC WITH DIFFERENTIAL/PLATELET
Abs Immature Granulocytes: 0.05 10*3/uL (ref 0.00–0.07)
Basophils Absolute: 0.1 10*3/uL (ref 0.0–0.1)
Basophils Relative: 1 %
Eosinophils Absolute: 0.2 10*3/uL (ref 0.0–0.5)
Eosinophils Relative: 2 %
HCT: 36.3 % — ABNORMAL LOW (ref 39.0–52.0)
Hemoglobin: 11.7 g/dL — ABNORMAL LOW (ref 13.0–17.0)
Immature Granulocytes: 1 %
Lymphocytes Relative: 10 %
Lymphs Abs: 0.9 10*3/uL (ref 0.7–4.0)
MCH: 28.3 pg (ref 26.0–34.0)
MCHC: 32.2 g/dL (ref 30.0–36.0)
MCV: 87.9 fL (ref 80.0–100.0)
Monocytes Absolute: 0.7 10*3/uL (ref 0.1–1.0)
Monocytes Relative: 8 %
Neutro Abs: 6.9 10*3/uL (ref 1.7–7.7)
Neutrophils Relative %: 78 %
Platelets: 255 10*3/uL (ref 150–400)
RBC: 4.13 MIL/uL — ABNORMAL LOW (ref 4.22–5.81)
RDW: 18.7 % — ABNORMAL HIGH (ref 11.5–15.5)
WBC: 8.8 10*3/uL (ref 4.0–10.5)
nRBC: 0 % (ref 0.0–0.2)

## 2019-06-22 LAB — TROPONIN I (HIGH SENSITIVITY)
Troponin I (High Sensitivity): 22 ng/L — ABNORMAL HIGH (ref <18)
Troponin I (High Sensitivity): 31 ng/L — ABNORMAL HIGH (ref <18)
Troponin I (High Sensitivity): 32 ng/L — ABNORMAL HIGH (ref <18)
Troponin I (High Sensitivity): 31 ng/L — ABNORMAL HIGH (ref <18)

## 2019-06-22 LAB — APTT: aPTT: 29 seconds (ref 24–36)

## 2019-06-22 LAB — C-REACTIVE PROTEIN: CRP: 5.5 mg/dL — ABNORMAL HIGH (ref <1.0)

## 2019-06-22 LAB — RESPIRATORY PANEL BY RT PCR (FLU A&B, COVID)
Influenza A by PCR: NEGATIVE
Influenza B by PCR: NEGATIVE
SARS Coronavirus 2 by RT PCR: NEGATIVE

## 2019-06-22 LAB — SEDIMENTATION RATE: Sed Rate: 61 mm/hr — ABNORMAL HIGH (ref 0–20)

## 2019-06-22 LAB — PROTIME-INR
INR: 1.2 (ref 0.8–1.2)
Prothrombin Time: 14.5 seconds (ref 11.4–15.2)

## 2019-06-22 LAB — BRAIN NATRIURETIC PEPTIDE: B Natriuretic Peptide: 118 pg/mL — ABNORMAL HIGH (ref 0.0–100.0)

## 2019-06-22 MED ORDER — COLCHICINE 0.6 MG PO TABS
0.6000 mg | ORAL_TABLET | Freq: Every day | ORAL | Status: DC | PRN
  Administered 2019-06-22: 21:00:00 0.6 mg via ORAL
  Filled 2019-06-22: qty 1

## 2019-06-22 MED ORDER — MORPHINE SULFATE (PF) 2 MG/ML IV SOLN
2.0000 mg | INTRAVENOUS | Status: DC | PRN
  Administered 2019-06-22 – 2019-06-25 (×8): 2 mg via INTRAVENOUS
  Filled 2019-06-22 (×8): qty 1

## 2019-06-22 MED ORDER — SODIUM CHLORIDE 0.9% FLUSH
3.0000 mL | Freq: Two times a day (BID) | INTRAVENOUS | Status: DC
  Administered 2019-06-22 – 2019-06-23 (×4): 3 mL via INTRAVENOUS

## 2019-06-22 MED ORDER — WARFARIN - PHARMACIST DOSING INPATIENT: Freq: Every day | Status: DC

## 2019-06-22 MED ORDER — ONDANSETRON HCL 4 MG/2ML IJ SOLN
4.0000 mg | Freq: Once | INTRAMUSCULAR | Status: AC
  Administered 2019-06-22: 11:00:00 4 mg via INTRAVENOUS
  Filled 2019-06-22: qty 2

## 2019-06-22 MED ORDER — DULOXETINE HCL 20 MG PO CPEP
20.0000 mg | ORAL_CAPSULE | Freq: Every day | ORAL | Status: DC
  Administered 2019-06-22 – 2019-06-26 (×5): 20 mg via ORAL
  Filled 2019-06-22 (×5): qty 1

## 2019-06-22 MED ORDER — ALLOPURINOL 100 MG PO TABS
100.0000 mg | ORAL_TABLET | Freq: Every day | ORAL | Status: DC
  Administered 2019-06-22 – 2019-06-26 (×5): 100 mg via ORAL
  Filled 2019-06-22 (×5): qty 1

## 2019-06-22 MED ORDER — FERROUS SULFATE 325 (65 FE) MG PO TABS
325.0000 mg | ORAL_TABLET | Freq: Two times a day (BID) | ORAL | Status: DC
  Administered 2019-06-23 – 2019-06-26 (×7): 325 mg via ORAL
  Filled 2019-06-22 (×7): qty 1

## 2019-06-22 MED ORDER — NITROGLYCERIN 0.4 MG SL SUBL: 0.4000 mg | SUBLINGUAL_TABLET | SUBLINGUAL | Status: DC | PRN

## 2019-06-22 MED ORDER — METOPROLOL SUCCINATE ER 50 MG PO TB24
50.0000 mg | ORAL_TABLET | Freq: Every day | ORAL | Status: DC
  Administered 2019-06-24 – 2019-06-26 (×3): 50 mg via ORAL
  Filled 2019-06-22 (×6): qty 1

## 2019-06-22 MED ORDER — METHYLPREDNISOLONE SODIUM SUCC 40 MG IJ SOLR
40.0000 mg | Freq: Two times a day (BID) | INTRAMUSCULAR | Status: DC
  Administered 2019-06-22 – 2019-06-23 (×2): 40 mg via INTRAVENOUS
  Filled 2019-06-22 (×2): qty 1

## 2019-06-22 MED ORDER — SODIUM CHLORIDE 0.9 % IV SOLN
2.0000 g | INTRAVENOUS | Status: DC
  Administered 2019-06-22: 2 g via INTRAVENOUS
  Filled 2019-06-22 (×2): qty 20

## 2019-06-22 MED ORDER — TRAZODONE HCL 50 MG PO TABS
50.0000 mg | ORAL_TABLET | Freq: Every evening | ORAL | Status: DC | PRN
  Administered 2019-06-22 – 2019-06-24 (×3): 50 mg via ORAL
  Filled 2019-06-22 (×4): qty 1

## 2019-06-22 MED ORDER — WARFARIN SODIUM 10 MG PO TABS
10.0000 mg | ORAL_TABLET | Freq: Once | ORAL | Status: AC
  Administered 2019-06-22: 10 mg via ORAL
  Filled 2019-06-22: qty 1

## 2019-06-22 MED ORDER — DM-GUAIFENESIN ER 30-600 MG PO TB12: 1.0000 | ORAL_TABLET | Freq: Two times a day (BID) | ORAL | Status: DC | PRN

## 2019-06-22 MED ORDER — HEPARIN BOLUS VIA INFUSION
6800.0000 [IU] | Freq: Once | INTRAVENOUS | Status: AC
  Administered 2019-06-22: 6800 [IU] via INTRAVENOUS
  Filled 2019-06-22: qty 6800

## 2019-06-22 MED ORDER — SODIUM CHLORIDE 0.9% FLUSH: 3.0000 mL | INTRAVENOUS | Status: DC | PRN

## 2019-06-22 MED ORDER — ALBUTEROL SULFATE (2.5 MG/3ML) 0.083% IN NEBU: 2.5000 mg | INHALATION_SOLUTION | RESPIRATORY_TRACT | Status: DC | PRN

## 2019-06-22 MED ORDER — FENTANYL CITRATE (PF) 100 MCG/2ML IJ SOLN
50.0000 ug | Freq: Once | INTRAMUSCULAR | Status: AC
  Administered 2019-06-22: 50 ug via INTRAVENOUS
  Filled 2019-06-22: qty 2

## 2019-06-22 MED ORDER — LISINOPRIL 2.5 MG PO TABS
2.5000 mg | ORAL_TABLET | Freq: Every day | ORAL | Status: DC
  Administered 2019-06-23 – 2019-06-26 (×4): 2.5 mg via ORAL
  Filled 2019-06-22 (×6): qty 1

## 2019-06-22 MED ORDER — FUROSEMIDE 10 MG/ML IJ SOLN
60.0000 mg | Freq: Two times a day (BID) | INTRAMUSCULAR | Status: DC
  Administered 2019-06-22 – 2019-06-24 (×4): 60 mg via INTRAVENOUS
  Filled 2019-06-22 (×5): qty 6

## 2019-06-22 MED ORDER — DILTIAZEM HCL 25 MG/5ML IV SOLN
10.0000 mg | Freq: Once | INTRAVENOUS | Status: AC
  Administered 2019-06-22: 10 mg via INTRAVENOUS
  Filled 2019-06-22: qty 5

## 2019-06-22 MED ORDER — IPRATROPIUM BROMIDE 0.02 % IN SOLN
0.5000 mg | Freq: Three times a day (TID) | RESPIRATORY_TRACT | Status: DC
  Administered 2019-06-23 – 2019-06-26 (×10): 0.5 mg via RESPIRATORY_TRACT
  Filled 2019-06-22 (×10): qty 2.5

## 2019-06-22 MED ORDER — ATORVASTATIN CALCIUM 20 MG PO TABS
40.0000 mg | ORAL_TABLET | Freq: Every day | ORAL | Status: DC
  Administered 2019-06-22 – 2019-06-26 (×5): 40 mg via ORAL
  Filled 2019-06-22 (×5): qty 2

## 2019-06-22 MED ORDER — FUROSEMIDE 10 MG/ML IJ SOLN
60.0000 mg | Freq: Once | INTRAMUSCULAR | Status: AC
  Administered 2019-06-22: 10:00:00 60 mg via INTRAVENOUS
  Filled 2019-06-22: qty 8

## 2019-06-22 MED ORDER — WARFARIN SODIUM 7.5 MG PO TABS
7.5000 mg | ORAL_TABLET | Freq: Once | ORAL | Status: DC
  Filled 2019-06-22: qty 1

## 2019-06-22 MED ORDER — HYDRALAZINE HCL 20 MG/ML IJ SOLN: 5.0000 mg | INTRAMUSCULAR | Status: DC | PRN

## 2019-06-22 MED ORDER — GABAPENTIN 300 MG PO CAPS
300.0000 mg | ORAL_CAPSULE | Freq: Three times a day (TID) | ORAL | Status: DC
  Administered 2019-06-22 – 2019-06-26 (×11): 300 mg via ORAL
  Filled 2019-06-22 (×12): qty 1

## 2019-06-22 MED ORDER — IPRATROPIUM BROMIDE 0.02 % IN SOLN
0.5000 mg | Freq: Four times a day (QID) | RESPIRATORY_TRACT | Status: DC
  Administered 2019-06-22 (×2): 0.5 mg via RESPIRATORY_TRACT
  Filled 2019-06-22 (×2): qty 2.5

## 2019-06-22 MED ORDER — HEPARIN (PORCINE) 25000 UT/250ML-% IV SOLN
2150.0000 [IU]/h | INTRAVENOUS | Status: DC
  Administered 2019-06-22 – 2019-06-23 (×2): 1700 [IU]/h via INTRAVENOUS
  Administered 2019-06-23 – 2019-06-24 (×2): 1950 [IU]/h via INTRAVENOUS
  Administered 2019-06-24: 2150 [IU]/h via INTRAVENOUS
  Filled 2019-06-22 (×5): qty 250

## 2019-06-22 MED ORDER — ACETAMINOPHEN 325 MG PO TABS
650.0000 mg | ORAL_TABLET | ORAL | Status: DC | PRN
  Administered 2019-06-24: 650 mg via ORAL
  Filled 2019-06-22: qty 2

## 2019-06-22 MED ORDER — HYDROXYZINE HCL 50 MG/ML IM SOLN
25.0000 mg | Freq: Four times a day (QID) | INTRAMUSCULAR | Status: DC | PRN
  Administered 2019-06-22 (×2): 25 mg via INTRAMUSCULAR
  Filled 2019-06-22 (×4): qty 0.5

## 2019-06-22 MED ORDER — SODIUM CHLORIDE 0.9 % IV SOLN: 250.0000 mL | INTRAVENOUS | Status: DC | PRN

## 2019-06-22 MED ORDER — ONDANSETRON HCL 4 MG/2ML IJ SOLN
4.0000 mg | Freq: Four times a day (QID) | INTRAMUSCULAR | Status: DC | PRN
  Administered 2019-06-22: 4 mg via INTRAVENOUS
  Filled 2019-06-22: qty 2

## 2019-06-22 MED ORDER — LEVOTHYROXINE SODIUM 50 MCG PO TABS
50.0000 ug | ORAL_TABLET | Freq: Every day | ORAL | Status: DC
  Administered 2019-06-23 – 2019-06-26 (×3): 50 ug via ORAL
  Filled 2019-06-22 (×2): qty 1
  Filled 2019-06-22: qty 2
  Filled 2019-06-22: qty 1

## 2019-06-22 NOTE — H&P (Signed)
History and Physical    [name] [name] BMW:[id number] DOB: Jun 07, 1960 DOA: 06/22/2019  Referring MD/NP/PA:   PCP: Doreen Beam, FNP   Patient coming from:  The patient is coming from home.  At baseline, pt is independent for most of ADL.        Chief Complaint: SOB and CP  HPI: [name] [name] is a 59 y.o. male with medical history significant of sCHF with EF of 25-30%, left bundle blockade, hypertension, hyperlipidemia, COPD, asthma, hypothyroidism, gout, depression, atrial fibrillation, PE/DVT on Coumadin, OSA on CPAP, CAD, iron deficiency anemia, who presents with shortness breath no chest pain.  Patient states that he has been having shortness breath, cough, subjective fever and chills in the past 3 days, which is worsened today.  He feels like he has a sinus infection. He states that he had some pressure-like chest pain this morning.  The chest pain is located in the left central chest, 9 out of 10 in severity, dull, nonradiating, not pleuritic, not aggravated with deep breath and coughing. Patient has nausea with occasional dry heaving.  Denies diarrhea or abdominal pain.  Patient has mild burning on urination, but denies dysuria or increased urinary frequency.  No hematuria. Pt states that he has bilateral lower extremity cellulitis.  He is recently supposed to come to hospital for intravenous antibiotics for cellulitis, but missed appointment.  Patient states that he ran out of his Coumadin, did not take Coumadin for 5 days.  ED Course: pt was found to have troponin 22, BMP 118, negative COVID-19 PCR, pending UA, electrolytes renal function okay, temperature normal, blood pressure 135/109, A. fib with RVR with HR up to 130s, oxygen saturation 93-95% on room air.  Chest x-ray showed cardiomegaly, vascular congestion and interstitial pulmonary edema.  Patient is placed on progressive bed for observation.   Review of Systems:   General: Has subjective fevers, chills, no body weight  gain, has fatigue HEENT: no blurry vision, hearing changes or sore throat Respiratory: has dyspnea, coughing, no wheezing CV: has chest pain, no palpitations GI: has nausea, no vomiting, abdominal pain, diarrhea, constipation GU: no dysuria, burning on urination, increased urinary frequency, hematuria  Ext: has leg edema Neuro: no unilateral weakness, numbness, or tingling, no vision change or hearing loss Skin: has chronic skin tear in both lower legs.  MSK: No muscle spasm, no deformity, no limitation of range of movement in spin Heme: No easy bruising.  Travel history: No recent long distant travel.  Allergy: No Known Allergies  Past Medical History:  Diagnosis Date  . Anxiety   . Arthritis   . Asthma   . Brain damage   . Chronic pain of both knees 07/13/2018  . Clotting disorder ([place])   . COPD (chronic obstructive pulmonary disease) ([place])   . Depression   . HFrEF (heart failure with reduced ejection fraction) ([place])    a. 03/2018 Echo: EF 25-30%, diff HK. Mod LAE.  [name] History of DVT (deep vein thrombosis)   . History of pulmonary embolism    a. Chronic coumadin.  [name] Hypertension   . MI (myocardial infarction) ([place])   . Morbid obesity ([place])   . Neck pain 07/13/2018  . NICM (nonischemic cardiomyopathy) ([place])    a. s/p Cath x 3 - reportedly nl cors. Last cath 2019 in [place]; b. a. 03/2018 Echo: EF 25-30%, diff HK.  [name] Persistent atrial fibrillation ([place])    a. 03/2018 s/p DCCV; b. CHA2DS2VASc = 1-->Xarelto (later changed to warfarin); c. 05/2018  recurrent afib-->Amio initiated.  . Sleep apnea     Past Surgical History:  Procedure Laterality Date  . CARDIOVERSION N/A 03/24/2018   Procedure: CARDIOVERSION;  Surgeon: Minna Merritts, MD;  Location: [facility] ORS;  Service: Cardiovascular;  Laterality: N/A;  . CARDIOVERSION N/A 08/08/2018   Procedure: CARDIOVERSION;  Surgeon: Nelva Bush, MD;  Location: [facility] ORS;  Service: Cardiovascular;  Laterality: N/A;  . hearnia repair     X 3-  total of two surgeries  . HERNIA REPAIR    . LEG SURGERY      Social History:  reports that he has never smoked. He has never used smokeless tobacco. He reports previous alcohol use. He reports that he does not use drugs.  Family History:  Family History  Problem Relation Age of Onset  . Heart failure Mother   . Lung cancer Mother   . Lung cancer Father   . Heart attack Maternal Grandmother   . Heart attack Maternal Grandfather      Prior to Admission medications   Medication Sig Start Date End Date Taking? Authorizing Provider  allopurinol (ZYLOPRIM) 100 MG tablet Take 1 tablet (100 mg total) by mouth daily. 05/08/19  Yes Flinchum, Kelby Aline, FNP  atorvastatin (LIPITOR) 40 MG tablet TAKE 1 TABLET BY MOUTH ONCE DAILY 03/06/19  Yes Johnson, Megan P, DO  DULoxetine (CYMBALTA) 20 MG capsule Take 1 capsule (20 mg total) by mouth daily. 05/08/19  Yes Flinchum, Kelby Aline, FNP  ferrous sulfate 325 (65 FE) MG tablet Take 1 tablet (325 mg total) by mouth 2 (two) times daily with a meal. 05/08/19  Yes Flinchum, Kelby Aline, FNP  gabapentin (NEURONTIN) 300 MG capsule Take 1 capsule (300 mg total) by mouth 3 (three) times daily. 05/08/19 08/06/19 Yes Flinchum, Kelby Aline, FNP  levothyroxine (SYNTHROID) 50 MCG tablet Take 1 tablet (50 mcg total) by mouth daily before breakfast. Need lab work beginning of June for Thyroid 05/08/19  Yes Flinchum, Kelby Aline, FNP  lisinopril (ZESTRIL) 2.5 MG tablet Take 1 tablet (2.5 mg total) by mouth daily. 05/08/19  Yes Flinchum, Kelby Aline, FNP  metoprolol succinate (TOPROL-XL) 50 MG 24 hr tablet Take 1 tablet (50 mg total) by mouth daily. Take with or immediately following a meal. 06/06/19  Yes End, Harrell Gave, MD  torsemide (DEMADEX) 20 MG tablet Take 3 tablets (60 mg total) by mouth 2 (two) times daily. 05/08/19  Yes Flinchum, Kelby Aline, FNP  traZODone (DESYREL) 150 MG tablet TAKE 1 TABLET BY MOUTH AT BEDTIME AS NEEDED SLEEP 05/08/19  Yes Flinchum, Kelby Aline, FNP   albuterol (VENTOLIN HFA) 108 (90 Base) MCG/ACT inhaler Inhale 2 puffs into the lungs every 6 (six) hours as needed for wheezing or shortness of breath. 07/04/18   Johnson, Megan P, DO  colchicine 0.6 MG tablet 1 tab PO q 12 hrs as needed until gout flare subsides, schedule on office follow up Patient taking differently: Take 0.6 mg by mouth daily as needed.  05/30/19   Flinchum, Kelby Aline, FNP  nitroGLYCERIN (NITROSTAT) 0.4 MG SL tablet Place 1 tablet under tongue every 5 minutes as needed for chest pain. (No more than 3 doses within 15 minutes) 03/08/19   Flinchum, Kelby Aline, FNP  warfarin (COUMADIN) 5 MG tablet Take by mouth daily as directed by the anti-coag clinic Patient not taking: Reported on 06/22/2019 05/31/19   Nelva Bush, MD    Physical Exam: Vitals:   06/22/19 0851 06/22/19 0930 06/22/19 0945  BP: (!) 135/109  Pulse: (!) 119 (!) 111 (!) 116  Resp: (!) 22 18 (!) 24  Temp: 97.9 F (36.6 C)    TempSrc: Oral    SpO2: 93% 95% 95%  Weight: (!) 182.3 kg    Height: _0  (1.727 m)     General: Not in acute distress HEENT:       Eyes: PERRL, EOMI, no scleral icterus.       ENT: No discharge from the ears and nose, no pharynx injection, no tonsillar enlargement.        Neck: difficult to  assess JVD due to obesity, no bruit, no mass felt. Heme: No neck lymph node enlargement. Cardiac: S1/S2, RRR, No murmurs, No gallops or rubs. Respiratory: has mild wheezing on the right side GI: Soft, nondistended, nontender, no rebound pain, no organomegaly, BS present. GU: No hematuria Ext: 3+ pitting leg edema bilaterally.1+DP/PT pulse bilaterally. Musculoskeletal: No joint deformities, No joint redness or warmth, no limitation of ROM in spin. Skin: has chronic skin tear in both lower legs Neuro: Alert, oriented X3, cranial nerves II-XII grossly intact, moves all extremities normally.  Psych: Patient is not psychotic, no suicidal or hemocidal ideation.  Labs on Admission: I have  personally reviewed following labs and imaging studies  CBC: Recent Labs  Lab 06/22/19 0852  WBC 8.8  NEUTROABS 6.9  HGB 11.7*  HCT 36.3*  MCV 87.9  PLT 563   Basic Metabolic Panel: Recent Labs  Lab 06/22/19 0852  NA 139  K 3.9  CL 103  CO2 24  GLUCOSE 142*  BUN 6  CREATININE 0.77  CALCIUM 7.8*   GFR: Estimated Creatinine Clearance: 162.3 mL/min (by C-G formula based on SCr of 0.77 mg/dL). Liver Function Tests: Recent Labs  Lab 06/22/19 0852  AST 28  ALT 19  ALKPHOS 93  BILITOT 0.8  PROT 7.3  ALBUMIN 3.1*   No results for input(s): LIPASE, AMYLASE in the last 168 hours. No results for input(s): AMMONIA in the last 168 hours. Coagulation Profile: No results for input(s): INR, PROTIME in the last 168 hours. Cardiac Enzymes: No results for input(s): CKTOTAL, CKMB, CKMBINDEX, TROPONINI in the last 168 hours. BNP (last 3 results) No results for input(s): PROBNP in the last 8760 hours. HbA1C: No results for input(s): HGBA1C in the last 72 hours. CBG: No results for input(s): GLUCAP in the last 168 hours. Lipid Profile: No results for input(s): CHOL, HDL, LDLCALC, TRIG, CHOLHDL, LDLDIRECT in the last 72 hours. Thyroid Function Tests: No results for input(s): TSH, T4TOTAL, FREET4, T3FREE, THYROIDAB in the last 72 hours. Anemia Panel: No results for input(s): VITAMINB12, FOLATE, FERRITIN, TIBC, IRON, RETICCTPCT in the last 72 hours. Urine analysis:    Component Value Date/Time   COLORURINE YELLOW (A) 01/21/2019 1221   APPEARANCEUR HAZY (A) 01/21/2019 1221   APPEARANCEUR Hazy (A) 12/25/2018 1442   LABSPEC 1.027 01/21/2019 1221   PHURINE 5.0 01/21/2019 1221   GLUCOSEU NEGATIVE 01/21/2019 1221   HGBUR NEGATIVE 01/21/2019 1221   BILIRUBINUR NEGATIVE 01/21/2019 1221   BILIRUBINUR Negative 12/25/2018 1442   KETONESUR NEGATIVE 01/21/2019 1221   PROTEINUR 30 (A) 01/21/2019 1221   NITRITE NEGATIVE 01/21/2019 1221   LEUKOCYTESUR TRACE (A) 01/21/2019 1221    Sepsis Labs: _1 (procalcitonin:4,lacticidven:4) ) Recent Results (from the past 240 hour(s))  Respiratory Panel by RT PCR (Flu A&B, Covid) - Nasopharyngeal Swab     Status: None   Collection Time: 06/22/19  8:52 AM   Specimen: Nasopharyngeal Swab  Result Value Ref Range Status  SARS Coronavirus 2 by RT PCR NEGATIVE NEGATIVE Final    Comment: (NOTE) SARS-CoV-2 target nucleic acids are NOT DETECTED. The SARS-CoV-2 RNA is generally detectable in upper respiratoy specimens during the acute phase of infection. The lowest concentration of SARS-CoV-2 viral copies this assay can detect is 131 copies/mL. A negative result does not preclude SARS-Cov-2 infection and should not be used as the sole basis for treatment or other patient management decisions. A negative result may occur with  improper specimen collection/handling, submission of specimen other than nasopharyngeal swab, presence of viral mutation(s) within the areas targeted by this assay, and inadequate number of viral copies (<131 copies/mL). A negative result must be combined with clinical observations, patient history, and epidemiological information. The expected result is Negative. Fact Sheet for Patients:  PinkCheek.be Fact Sheet for Healthcare Providers:  GravelBags.it This test is not yet ap proved or cleared by the [place] FDA and  has been authorized for detection and/or diagnosis of SARS-CoV-2 by FDA under an Emergency Use Authorization (EUA). This EUA will remain  in effect (meaning this test can be used) for the duration of the COVID-19 declaration under Section 564(b)(1) of the Act, 21 U.S.C. section 360bbb-3(b)(1), unless the authorization is terminated or revoked sooner.    Influenza A by PCR NEGATIVE NEGATIVE Final   Influenza B by PCR NEGATIVE NEGATIVE Final    Comment: (NOTE) The Xpert Xpress SARS-CoV-2/FLU/RSV assay is intended as an  aid in  the diagnosis of influenza from Nasopharyngeal swab specimens and  should not be used as a sole basis for treatment. Nasal washings and  aspirates are unacceptable for Xpert Xpress SARS-CoV-2/FLU/RSV  testing. Fact Sheet for Patients: PinkCheek.be Fact Sheet for Healthcare Providers: GravelBags.it This test is not yet approved or cleared by the [place] FDA and  has been authorized for detection and/or diagnosis of SARS-CoV-2 by  FDA under an Emergency Use Authorization (EUA). This EUA will remain  in effect (meaning this test can be used) for the duration of the  Covid-19 declaration under Section 564(b)(1) of the Act, 21  U.S.C. section 360bbb-3(b)(1), unless the authorization is  terminated or revoked. Performed at [facility], [address], [place], [place] [address]      Radiological Exams on Admission: DG Chest Portable 1 View  Result Date: 06/22/2019 CLINICAL DATA:  Shortness of breath EXAM: PORTABLE CHEST 1 VIEW COMPARISON:  03/04/2019 FINDINGS: 0907 hours. The cardio pericardial silhouette is enlarged. Pulmonary vascular congestion noted with interstitial pulmonary edema pattern. No dense focal airspace consolidation. No appreciable pleural effusion. The visualized bony structures of the thorax are intact. Telemetry leads overlie the chest. IMPRESSION: Cardiomegaly with vascular congestion and probable interstitial edema. Electronically Signed   By: Misty Stanley M.D.   On: 06/22/2019 09:19     EKG: Independently reviewed.  Atrial fibrillation, QTC 524, RAD, old left bundle blockade, poor R wave progression, anteroseptal infarction pattern  Assessment/Plan Principal Problem:   Acute on chronic systolic CHF (congestive heart failure) (HCC) Active Problems:   COPD (chronic obstructive pulmonary disease) (HCC)   HTN (hypertension)   Personal history of DVT (deep vein thrombosis)   Atrial  fibrillation with RVR (HCC)   Chest pain   PE (pulmonary thromboembolism) (HCC)   HLD (hyperlipidemia)   CAD (coronary artery disease)   Lower extremity cellulitis   Hypothyroidism   Iron deficiency anemia   Depression   Elevated troponin   Acute on chronic systolic CHF (congestive heart failure) [facility]): Patient has 3+  leg edema, elevated BNP, chest x-ray showed cardiomegaly, vascular congestion and interstitial edema, clinically consistent with CHF exacerbation.  2D echo on 03/24/2018 showed EF 25-30%.  -Place to progressive unit for observation -Lasix 60 mg bid by IV -trend trop -2d echo -Daily weights -strict I/O's -Low salt diet -Fluid restriction  Elevated troponin and chest pain and hx of CAD: trop 22.  Possibly due to demand ischemia.  Another differential diagnosis is PE since patient did not take Coumadin for 5 days, but his chest pain is not pleuritic, it is dull, not aggravated by deep breath or coughing, not typical for PE.  Will check INR, if IRN is subtherapeutic, will start IV heparin bridge. - Trend Trop - Repeat EKG in the am  - prn Nitroglycerin, Morphine, lipitor  - Risk factor stratification: will check FLP and A1C  - check UDS - 2d echo  COPD (chronic obstructive pulmonary disease) ([place]): has mild wheezing on right side on auscultation. -Bronchodilators -Solu-Medrol 40 mg twice daily  HTN:  -Continue home medications: Lisinopril, metoprolol -hydralazine prn  PE and Personal history of DVT (deep vein thrombosis): -continue coumadin per pharm -Will check INR, if IRN is subtherapeutic, will start IV heparin bridge.  Atrial fibrillation with RVR ([place]): HR is up to 130s -received 10 mg of cardizem by IV in ED -continue metoprolol -if heart rate is not controlled, will start on IV Cardizem -check TSH  HLD (hyperlipidemia) -lipitor  Lower extremity cellulitis: -start IV rocephin -Blood culture, ESR and CRP -consult to wound  care  Hypothyroidism -continue Synthroid  Iron deficiency anemia: Hemoglobin 11.7 -Continue iron supplement -Follow-up with CBC  Depression: No SI or HI. -continue Cymbalta  Gout: -continue home allopurinol and Colchicine   DVT ppx: On Couamdin Code Status: Full code Family Communication: not done, no family member is at bed side.   Disposition Plan:  Anticipate discharge back to previous home environment Consults called:  None admission status: progressive unit for obs     Status is: Observation  The patient remains OBS appropriate and will d/c before 2 midnights.  Dispo: The patient is from: Home              Anticipated d/c is to: Home              Anticipated d/c date is: 1 day              Patient currently is not medically stable to d/c.           Date of Service 06/22/2019    Ivor Costa [facility]   If 7PM-7AM, please contact night-coverage [email or web address] 06/22/2019, 12:14 PM

## 2019-06-22 NOTE — ED Triage Notes (Signed)
Pt presents via acems with c/o shortness of breath, chest pain and chills that began 3 hours ago. Pt endorses fever and cough at home. Pt recently supposed to come to hospital for intravenous antibiotics for cellulitis, but missed appointment. Pt c/o nausea, but denies abdominal pain at this time.Pt currently alert, respirations labored at this time. Pt does not wear oxygen at home, only cpap at night.

## 2019-06-22 NOTE — Progress Notes (Addendum)
ANTICOAGULATION CONSULT NOTE - Initial Consult  Pharmacy Consult for Warfarin with Heparin drip bridge Indication :hx pulmonary embolus/DVT and Afib  No Known Allergies  Patient Measurements: Height: 5\' 8"  (172.7 cm) Weight: (!) 182.3 kg (402 lb) IBW/kg (Calculated) : 68.4 Heparin Dosing Weight:  114.6 kg  Vital Signs: Temp: 98.8 F (37.1 C) (05/07 1419) Temp Source: Oral (05/07 1419) BP: 126/70 (05/07 1419) Pulse Rate: 96 (05/07 1333)  Labs: Recent Labs    06/22/19 0852 06/22/19 1324  HGB 11.7*  --   HCT 36.3*  --   PLT 255  --   LABPROT  --  14.5  INR  --  1.2  CREATININE 0.77  --   TROPONINIHS 22* 31*    Estimated Creatinine Clearance: 162.3 mL/min (by C-G formula based on SCr of 0.77 mg/dL).   Medical History: Past Medical History:  Diagnosis Date  . Anxiety   . Arthritis   . Asthma   . Brain damage   . Chronic pain of both knees 07/13/2018  . Clotting disorder (HCC)   . COPD (chronic obstructive pulmonary disease) (HCC)   . Depression   . HFrEF (heart failure with reduced ejection fraction) (HCC)    a. 03/2018 Echo: EF 25-30%, diff HK. Mod LAE.  04/2018 History of DVT (deep vein thrombosis)   . History of pulmonary embolism    a. Chronic coumadin.  [name] Hypertension   . MI (myocardial infarction) (HCC)   . Morbid obesity (HCC)   . Neck pain 07/13/2018  . NICM (nonischemic cardiomyopathy) (HCC)    a. s/p Cath x 3 - reportedly nl cors. Last cath 2019 in [place]; b. a. 03/2018 Echo: EF 25-30%, diff HK.  04/2018 Persistent atrial fibrillation (HCC)    a. 03/2018 s/p DCCV; b. CHA2DS2VASc = 1-->Xarelto (later changed to warfarin); c. 05/2018 recurrent afib-->Amio initiated.  . Sleep apnea     Medications:  Scheduled:  . ipratropium  0.5 mg Nebulization Q6H  . methylPREDNISolone (SOLU-MEDROL) injection  40 mg Intravenous Q12H  . warfarin  7.5 mg Oral ONCE-1600  . Warfarin - Pharmacist Dosing Inpatient   Does not apply q1600   Infusions:  . cefTRIAXone (ROCEPHIN)  IV       Assessment: 59 yo male on Warfarin PTA for DVT/PE, Afib.  -Patient states that he ran out of his Coumadin, did not take Coumadin for 5 days. Patient states he takes 10 mg (#2 tabs of 5 mg) daily.  Per cards note in Careeverywhere:Mr. Schnurr does not follow-up consistently for warfarin monitoring,Had PE while on NOAC so switch to warfarin. Hgb 11.7  Plt 255  INR 1.2  aptt 29   5/7 INR  1.2    Goal of Therapy:  INR 2-3 Heparin level 0.3-0.7 units/ml Monitor platelets by anticoagulation protocol: Yes   Plan:  Heparin- Give 6800 units bolus x 1 Start heparin infusion at 1700 units/hr Check anti-Xa level in 6 hours and daily while on heparin Continue to monitor H&H and platelets   Warfarin- Will order Warfarin 10 mg po x 1. Patient states has been off warfarin at least 5 days- will treat more like a new start. Heparin drip bridge until INR therapetutc x 2 DDI: allopurinol, abx        Note hx CHF Will f/u INR in am   Marios Gaiser A 06/22/2019,2:47 PM

## 2019-06-22 NOTE — ED Notes (Signed)
This rn attempted to call report at this time

## 2019-06-22 NOTE — ED Notes (Signed)
Pt refusing to keep vital sign monitoring devices on at this time. MD aware.

## 2019-06-22 NOTE — Consult Note (Addendum)
WOC Nurse Consult Note: Reason for Consult: Consult requested for BLE wounds.  Pt was wearing Una boots prior to admission and states they are changed Q week; they are due to be changed on Fridays.  Removed Una boots to assess legs. Generalized edema and erythemia. Wound type: Left calf with chronic full thickness wounds in patchy area; affected site is 5X10X.2cm, 100% yellow, large amt yellow drainage Right calf with chronic full thickness wounds in patchy area; affected site is 5X16X.2cm, 100% yellow, large amt yellow drainage Right plantar foot with previous blister which has ruptured and evolved into a partial thickness wound; 9X7X.1cm, pink and dry Dressing procedure/placement/frequency: Applied Aquacel to absorb drainage and provide antimicrobial benefits, then bilat Una boots and coban. WOC team will change compression wraps Q Fri if patient remains in the hospital. Thank-you,  Cammie Mcgee MSN, RN, CWOCN, [place] [facility], CNS [phone]

## 2019-06-22 NOTE — Chronic Care Management (AMB) (Signed)
Error: Please disregard this encounter. 

## 2019-06-22 NOTE — ED Notes (Signed)
Assigned bed @ 1254, spoke with RN Pattricia Boss.

## 2019-06-22 NOTE — ED Provider Notes (Signed)
[facility] Emergency Department Provider Note  Time seen: 8:57 AM  I have reviewed the triage vital signs and the nursing notes.   HISTORY  Chief Complaint Weakness   HPI [name] [name] is a 59 y.o. male with a past medical history anxiety, arthritis, COPD, morbid obesity, atrial fibrillation, presents to the emergency department for shortness of breath.  According to the patient for the past 2 to 3 days he has been experiencing subjective fevers cough and feels like he has a "sinus infection."  Patient states this morning over the past 4 hours or so he has been experiencing chest tightness and shortness of breath as well.  Patient states nausea with occasional dry heaving.  Denies diarrhea.  Denies abdominal pain.  Has not received Covid vaccinations.   Past Medical History:  Diagnosis Date  . Anxiety   . Arthritis   . Asthma   . Brain damage   . Chronic pain of both knees 07/13/2018  . Clotting disorder (HCC)   . COPD (chronic obstructive pulmonary disease) (HCC)   . Depression   . HFrEF (heart failure with reduced ejection fraction) (HCC)    a. 03/2018 Echo: EF 25-30%, diff HK. Mod LAE.  [name] History of DVT (deep vein thrombosis)   . History of pulmonary embolism    a. Chronic coumadin.  [name] Hypertension   . MI (myocardial infarction) (HCC)   . Morbid obesity (HCC)   . Neck pain 07/13/2018  . NICM (nonischemic cardiomyopathy) (HCC)    a. s/p Cath x 3 - reportedly nl cors. Last cath 2019 in [place]; b. a. 03/2018 Echo: EF 25-30%, diff HK.  [name] Persistent atrial fibrillation (HCC)    a. 03/2018 s/p DCCV; b. CHA2DS2VASc = 1-->Xarelto (later changed to warfarin); c. 05/2018 recurrent afib-->Amio initiated.  . Sleep apnea     Patient Active Problem List   Diagnosis Date Noted  . Left-sided Bell's palsy 05/08/2019  . Slurred speech 05/03/2019  . Chronic intractable headache 05/03/2019  . Head injuries, initial encounter 05/03/2019  . Hand pain, left 05/03/2019  .  Noncompliance by refusing service 05/03/2019  . Facial droop 05/02/2019  . Pharmacologic therapy 04/11/2019  . Disorder of skeletal system 04/11/2019  . Problems influencing health status 04/11/2019  . Chronic anticoagulation (warfarin  COUMADIN) 04/11/2019  . Hypocalcemia 04/11/2019  . Elevated sed rate 04/11/2019  . Elevated C-reactive protein (CRP) 04/11/2019  . Elevated hemoglobin A1c 04/11/2019  . Hypoalbuminemia 04/11/2019  . Edema due to hypoalbuminemia 04/11/2019  . Elevated brain natriuretic peptide (BNP) level 04/11/2019  . Chronic hip pain (Right) 04/11/2019  . Osteoarthritis of hip (Right) 04/11/2019  . Chronic atrial fibrillation (HCC) 03/08/2019  . Degenerative joint disease of right hip 03/05/2019  . Hypothyroidism 03/04/2019  . Chronic venous stasis dermatitis of both lower extremities 03/04/2019  . Long term (current) use of anticoagulants 02/23/2019  . Cellulitis 01/22/2019  . PE (pulmonary thromboembolism) (HCC) 01/21/2019  . Open leg wound 01/21/2019  . HLD (hyperlipidemia) 01/21/2019  . CAD (coronary artery disease) 01/21/2019  . Cellulitis of lower extremity 01/21/2019  . Noncompliance 01/09/2019  . Acquired thrombophilia (HCC) 11/28/2018  . Subtherapeutic international normalized ratio (INR) 11/11/2018  . Acute pulmonary embolism (HCC) 11/01/2018  . Depression, major, single episode, moderate (HCC) 09/17/2018  . Chest pain 08/06/2018  . Personal history of DVT (deep vein thrombosis) 07/13/2018  . History of pulmonary embolism (on Coumadin) 07/13/2018  . Chronic low back pain (Bilateral)  w/ sciatica (Bilateral) 07/13/2018  .  TBI (traumatic brain injury) ([place]) 07/04/2018  . Anemia 06/27/2018  . Abnormal thyroid blood test 06/27/2018  . Diet-controlled diabetes mellitus ([place]) 06/06/2018  . HTN (hypertension) 06/06/2018  . Chronic gout involving toe, unspecified cause, unspecified laterality 06/06/2018  . Morbid obesity ([place]) 06/06/2018  . Chronic pain  syndrome 06/06/2018  . Ulcers of both lower extremities, limited to breakdown of skin ([place]) 06/06/2018  . Persistent atrial fibrillation ([place])   . Chronic combined systolic and diastolic heart failure ([place]) 02/02/2018  . COPD (chronic obstructive pulmonary disease) ([place]) 02/02/2018  . Obstructive sleep apnea 02/02/2018  . Lymphedema 02/02/2018  . Acute on chronic systolic CHF (congestive heart failure) ([place]) 02/02/2018  . Acute respiratory failure with hypoxia ([place]) 01/27/2018    Past Surgical History:  Procedure Laterality Date  . CARDIOVERSION N/A 03/24/2018   Procedure: CARDIOVERSION;  Surgeon: Minna Merritts, MD;  Location: [facility] ORS;  Service: Cardiovascular;  Laterality: N/A;  . CARDIOVERSION N/A 08/08/2018   Procedure: CARDIOVERSION;  Surgeon: Nelva Bush, MD;  Location: [facility] ORS;  Service: Cardiovascular;  Laterality: N/A;  . hearnia repair     X 3- total of two surgeries  . HERNIA REPAIR    . LEG SURGERY      Prior to Admission medications   Medication Sig Start Date End Date Taking? Authorizing Provider  albuterol (VENTOLIN HFA) 108 (90 Base) MCG/ACT inhaler Inhale 2 puffs into the lungs every 6 (six) hours as needed for wheezing or shortness of breath. 07/04/18   Johnson, Megan P, DO  allopurinol (ZYLOPRIM) 100 MG tablet Take 1 tablet (100 mg total) by mouth daily. 05/08/19   Flinchum, Kelby Aline, FNP  atorvastatin (LIPITOR) 40 MG tablet TAKE 1 TABLET BY MOUTH ONCE DAILY 03/06/19   Park Liter P, DO  colchicine 0.6 MG tablet 1 tab PO q 12 hrs as needed until gout flare subsides, schedule on office follow up 05/30/19   Flinchum, Kelby Aline, FNP  DULoxetine (CYMBALTA) 20 MG capsule Take 1 capsule (20 mg total) by mouth daily. 05/08/19   Flinchum, Kelby Aline, FNP  ferrous sulfate 325 (65 FE) MG tablet Take 1 tablet (325 mg total) by mouth 2 (two) times daily with a meal. 05/08/19   Flinchum, Kelby Aline, FNP  gabapentin (NEURONTIN) 300 MG capsule Take 1 capsule (300 mg total)  by mouth 3 (three) times daily. 05/08/19 08/06/19  Flinchum, Kelby Aline, FNP  levothyroxine (SYNTHROID) 50 MCG tablet Take 1 tablet (50 mcg total) by mouth daily before breakfast. Need lab work beginning of June for Thyroid 05/08/19   Flinchum, Kelby Aline, FNP  lisinopril (ZESTRIL) 2.5 MG tablet Take 1 tablet (2.5 mg total) by mouth daily. 05/08/19   Flinchum, Kelby Aline, FNP  metoprolol succinate (TOPROL-XL) 50 MG 24 hr tablet Take 1 tablet (50 mg total) by mouth daily. Take with or immediately following a meal. 06/06/19   End, Harrell Gave, MD  nitroGLYCERIN (NITROSTAT) 0.4 MG SL tablet Place 1 tablet under tongue every 5 minutes as needed for chest pain. (No more than 3 doses within 15 minutes) 03/08/19   Flinchum, Kelby Aline, FNP  torsemide (DEMADEX) 20 MG tablet Take 3 tablets (60 mg total) by mouth 2 (two) times daily. 05/08/19   Flinchum, Kelby Aline, FNP  traZODone (DESYREL) 150 MG tablet TAKE 1 TABLET BY MOUTH AT BEDTIME AS NEEDED SLEEP 05/08/19   Flinchum, Kelby Aline, FNP  warfarin (COUMADIN) 5 MG tablet Take by mouth daily as directed by the anti-coag clinic 05/31/19   End, Harrell Gave,  MD    No Known Allergies  Family History  Problem Relation Age of Onset  . Heart failure Mother   . Lung cancer Mother   . Lung cancer Father   . Heart attack Maternal Grandmother   . Heart attack Maternal Grandfather     Social History Social History   Tobacco Use  . Smoking status: Never Smoker  . Smokeless tobacco: Never Used  Substance Use Topics  . Alcohol use: Not Currently  . Drug use: Never    Review of Systems Constitutional: Negative for fever. Eyes: Negative for visual complaints Cardiovascular: Positive for chest tightness Respiratory: Positive shortness of breath Gastrointestinal: Negative for abdominal pain Musculoskeletal: Negative for musculoskeletal complaints Neurological: Negative for headache All other ROS  negative  ____________________________________________   PHYSICAL EXAM:  VITAL SIGNS: ED Triage Vitals [06/22/19 0851]  Enc Vitals Group     BP (!) 135/109     Pulse Rate (!) 119     Resp (!) 22     Temp 97.9 F (36.6 C)     Temp Source Oral     SpO2 93 %     Weight (!) 402 lb (182.3 kg)     Height 5\' 8"  (1.727 m)     Head Circumference      Peak Flow      Pain Score 6     Pain Loc      Pain Edu?      Excl. in GC?    Constitutional: Alert and oriented.  Mild distress due to dry heaving, sitting upright in bed. Eyes: Normal exam ENT      Head: Normocephalic and atraumatic.      Mouth/Throat: Mucous membranes are moist. Cardiovascular: Irregular rhythm rate around 120.  No murmur. Respiratory: Mild tachypnea.  No obvious wheeze rales or rhonchi. Gastrointestinal: Soft and nontender. No distention.  Morbidly obese. Musculoskeletal: Able to move all extremities. Neurologic:  Normal speech and language. No gross focal neurologic deficits  Skin:  Skin is warm, dry and intact.  Psychiatric: Mood and affect are normal.   ____________________________________________    EKG  EKG viewed and interpreted by myself shows atrial fibrillation with rapid ventricular response at 126 bpm with a widened QRS, normal axis, slight QTC prolongation, nonspecific ST changes.  ____________________________________________    RADIOLOGY  Chest x-ray shows interstitial edema.  ____________________________________________   INITIAL IMPRESSION / ASSESSMENT AND PLAN / ED COURSE  Pertinent labs & imaging results that were available during my care of the patient were reviewed by me and considered in my medical decision making (see chart for details).   Patient presents to the emergency department for shortness of breath, states subjective fever cough and nasal congestion over the past 2 days.  Patient is morbidly obese.  Currently in atrial fib with rapid ventricular response.  Has a history  of atrial fibrillation in the past.  Did not take his medications this morning.  Differential would include A. fib with RVR, upper respiratory infection, pneumonia, Covid, CHF, ACS.  We will check labs including cardiac enzymes.  Obtain a chest x-ray.  We will dose diltiazem for A. fib with RVR.  Patient agreeable to plan.\  Chest x-ray with interstitial edema.  Covid negative.  Mildly elevated troponin as well as BNP.  Dosed IV Lasix, receiving IV diltiazem as needed for A. fib with RVR.  Current heart rate around 115 we will dose additional 10 mg of diltiazem.  We will admit to the hospital service  for further treatment.  Xaviar Lunn was evaluated in Emergency Department on 06/22/2019 for the symptoms described in the history of present illness. He was evaluated in the context of the global COVID-19 pandemic, which necessitated consideration that the patient might be at risk for infection with the SARS-CoV-2 virus that causes COVID-19. Institutional protocols and algorithms that pertain to the evaluation of patients at risk for COVID-19 are in a state of rapid change based on information released by regulatory bodies including the CDC and federal and state organizations. These policies and algorithms were followed during the patient's care in the ED.  CRITICAL CARE Performed by: Minna Antis   Total critical care time: 30 minutes  Critical care time was exclusive of separately billable procedures and treating other patients.  Critical care was necessary to treat or prevent imminent or life-threatening deterioration.  Critical care was time spent personally by me on the following activities: development of treatment plan with patient and/or surrogate as well as nursing, discussions with consultants, evaluation of patient's response to treatment, examination of patient, obtaining history from patient or surrogate, ordering and performing treatments and interventions, ordering and review of  laboratory studies, ordering and review of radiographic studies, pulse oximetry and re-evaluation of patient's condition.   ____________________________________________   FINAL CLINICAL IMPRESSION(S) / ED DIAGNOSES  Dyspnea   Minna Antis, MD 06/22/19 [phone]

## 2019-06-23 ENCOUNTER — Observation Stay
Admit: 2019-06-23 | Discharge: 2019-06-23 | Disposition: A | Discharge status: Home / Self Care | Payer: Medicaid Other | Attending: Internal Medicine | Admitting: Internal Medicine

## 2019-06-23 DIAGNOSIS — R7982 Elevated C-reactive protein (CRP): Secondary | ICD-10-CM | POA: Diagnosis present

## 2019-06-23 DIAGNOSIS — E662 Morbid (severe) obesity with alveolar hypoventilation: Secondary | ICD-10-CM | POA: Diagnosis present

## 2019-06-23 DIAGNOSIS — I2510 Atherosclerotic heart disease of native coronary artery without angina pectoris: Secondary | ICD-10-CM | POA: Diagnosis present

## 2019-06-23 DIAGNOSIS — I428 Other cardiomyopathies: Secondary | ICD-10-CM | POA: Diagnosis present

## 2019-06-23 DIAGNOSIS — L97221 Non-pressure chronic ulcer of left calf limited to breakdown of skin: Secondary | ICD-10-CM | POA: Diagnosis present

## 2019-06-23 DIAGNOSIS — R531 Weakness: Secondary | ICD-10-CM | POA: Diagnosis not present

## 2019-06-23 DIAGNOSIS — L03116 Cellulitis of left lower limb: Secondary | ICD-10-CM | POA: Diagnosis present

## 2019-06-23 DIAGNOSIS — J449 Chronic obstructive pulmonary disease, unspecified: Secondary | ICD-10-CM | POA: Diagnosis present

## 2019-06-23 DIAGNOSIS — Z6841 Body Mass Index (BMI) 40.0 and over, adult: Secondary | ICD-10-CM | POA: Diagnosis not present

## 2019-06-23 DIAGNOSIS — R1012 Left upper quadrant pain: Secondary | ICD-10-CM | POA: Diagnosis present

## 2019-06-23 DIAGNOSIS — D509 Iron deficiency anemia, unspecified: Secondary | ICD-10-CM | POA: Diagnosis present

## 2019-06-23 DIAGNOSIS — R0789 Other chest pain: Secondary | ICD-10-CM | POA: Diagnosis not present

## 2019-06-23 DIAGNOSIS — F411 Generalized anxiety disorder: Secondary | ICD-10-CM | POA: Diagnosis present

## 2019-06-23 DIAGNOSIS — E785 Hyperlipidemia, unspecified: Secondary | ICD-10-CM | POA: Diagnosis present

## 2019-06-23 DIAGNOSIS — R0602 Shortness of breath: Secondary | ICD-10-CM | POA: Diagnosis not present

## 2019-06-23 DIAGNOSIS — L89312 Pressure ulcer of right buttock, stage 2: Secondary | ICD-10-CM | POA: Diagnosis not present

## 2019-06-23 DIAGNOSIS — Z20822 Contact with and (suspected) exposure to covid-19: Secondary | ICD-10-CM | POA: Diagnosis not present

## 2019-06-23 DIAGNOSIS — I248 Other forms of acute ischemic heart disease: Secondary | ICD-10-CM | POA: Diagnosis present

## 2019-06-23 DIAGNOSIS — I447 Left bundle-branch block, unspecified: Secondary | ICD-10-CM | POA: Diagnosis present

## 2019-06-23 DIAGNOSIS — E039 Hypothyroidism, unspecified: Secondary | ICD-10-CM | POA: Diagnosis present

## 2019-06-23 DIAGNOSIS — L97211 Non-pressure chronic ulcer of right calf limited to breakdown of skin: Secondary | ICD-10-CM | POA: Diagnosis present

## 2019-06-23 DIAGNOSIS — L97511 Non-pressure chronic ulcer of other part of right foot limited to breakdown of skin: Secondary | ICD-10-CM | POA: Diagnosis present

## 2019-06-23 DIAGNOSIS — I5023 Acute on chronic systolic (congestive) heart failure: Secondary | ICD-10-CM | POA: Diagnosis not present

## 2019-06-23 DIAGNOSIS — F329 Major depressive disorder, single episode, unspecified: Secondary | ICD-10-CM | POA: Diagnosis present

## 2019-06-23 DIAGNOSIS — I5031 Acute diastolic (congestive) heart failure: Secondary | ICD-10-CM | POA: Diagnosis not present

## 2019-06-23 DIAGNOSIS — B9562 Methicillin resistant Staphylococcus aureus infection as the cause of diseases classified elsewhere: Secondary | ICD-10-CM | POA: Diagnosis present

## 2019-06-23 DIAGNOSIS — L03115 Cellulitis of right lower limb: Secondary | ICD-10-CM | POA: Diagnosis not present

## 2019-06-23 DIAGNOSIS — I4819 Other persistent atrial fibrillation: Secondary | ICD-10-CM | POA: Diagnosis present

## 2019-06-23 DIAGNOSIS — I110 Hypertensive heart disease with heart failure: Secondary | ICD-10-CM | POA: Diagnosis present

## 2019-06-23 LAB — LIPID PANEL
Cholesterol: 163 mg/dL (ref 0–200)
HDL: 79 mg/dL (ref >40)
LDL Cholesterol: 65 mg/dL (ref 0–99)
Total CHOL/HDL Ratio: 2.1 RATIO
Triglycerides: 95 mg/dL (ref <150)
VLDL: 19 mg/dL (ref 0–40)

## 2019-06-23 LAB — BASIC METABOLIC PANEL
Anion gap: 13 (ref 5–15)
BUN: 11 mg/dL (ref 6–20)
CO2: 26 mmol/L (ref 22–32)
Calcium: 8.4 mg/dL — ABNORMAL LOW (ref 8.9–10.3)
Chloride: 96 mmol/L — ABNORMAL LOW (ref 98–111)
Creatinine, Ser: 1 mg/dL (ref 0.61–1.24)
GFR calc Af Amer: >60 mL/min (ref >60)
GFR calc non Af Amer: >60 mL/min (ref >60)
Glucose, Bld: 148 mg/dL — ABNORMAL HIGH (ref 70–99)
Potassium: 3.9 mmol/L (ref 3.5–5.1)
Sodium: 135 mmol/L (ref 135–145)

## 2019-06-23 LAB — CBC
HCT: 39 % (ref 39.0–52.0)
Hemoglobin: 12.4 g/dL — ABNORMAL LOW (ref 13.0–17.0)
MCH: 28.1 pg (ref 26.0–34.0)
MCHC: 31.8 g/dL (ref 30.0–36.0)
MCV: 88.2 fL (ref 80.0–100.0)
Platelets: 252 10*3/uL (ref 150–400)
RBC: 4.42 MIL/uL (ref 4.22–5.81)
RDW: 18.4 % — ABNORMAL HIGH (ref 11.5–15.5)
WBC: 6.7 10*3/uL (ref 4.0–10.5)
nRBC: 0.3 % — ABNORMAL HIGH (ref 0.0–0.2)

## 2019-06-23 LAB — HEPARIN LEVEL (UNFRACTIONATED)
Heparin Unfractionated: <0.1 IU/mL — ABNORMAL LOW (ref 0.30–0.70)
Heparin Unfractionated: 0.51 IU/mL (ref 0.30–0.70)
Heparin Unfractionated: 0.35 IU/mL (ref 0.30–0.70)

## 2019-06-23 LAB — LIPASE, BLOOD: Lipase: 18 U/L (ref 11–51)

## 2019-06-23 LAB — TSH: TSH: 4.098 u[IU]/mL (ref 0.350–4.500)

## 2019-06-23 LAB — ECHOCARDIOGRAM COMPLETE
Height: 68 in
Weight: 6542.4 oz

## 2019-06-23 LAB — PROTIME-INR
INR: 1.3 — ABNORMAL HIGH (ref 0.8–1.2)
Prothrombin Time: 15.2 seconds (ref 11.4–15.2)

## 2019-06-23 LAB — HEMOGLOBIN A1C
Hgb A1c MFr Bld: 6.5 % — ABNORMAL HIGH (ref 4.8–5.6)
Mean Plasma Glucose: 139.85 mg/dL

## 2019-06-23 LAB — MAGNESIUM: Magnesium: 1.7 mg/dL (ref 1.7–2.4)

## 2019-06-23 MED ORDER — LINEZOLID 600 MG/300ML IV SOLN
600.0000 mg | Freq: Two times a day (BID) | INTRAVENOUS | Status: AC
  Administered 2019-06-23 – 2019-06-25 (×5): 600 mg via INTRAVENOUS
  Filled 2019-06-23 (×6): qty 300

## 2019-06-23 MED ORDER — PREDNISONE 20 MG PO TABS
50.0000 mg | ORAL_TABLET | Freq: Every day | ORAL | Status: AC
  Administered 2019-06-24 – 2019-06-26 (×3): 50 mg via ORAL
  Filled 2019-06-23: qty 1
  Filled 2019-06-23: qty 3
  Filled 2019-06-23: qty 1

## 2019-06-23 MED ORDER — WARFARIN SODIUM 4 MG PO TABS
8.0000 mg | ORAL_TABLET | Freq: Once | ORAL | Status: AC
  Administered 2019-06-23: 8 mg via ORAL
  Filled 2019-06-23: qty 2

## 2019-06-23 MED ORDER — VANCOMYCIN HCL IN DEXTROSE 1-5 GM/200ML-% IV SOLN: 1000.0000 mg | Freq: Once | INTRAVENOUS | Status: DC

## 2019-06-23 MED ORDER — HEPARIN BOLUS VIA INFUSION
3400.0000 [IU] | Freq: Once | INTRAVENOUS | Status: AC
  Administered 2019-06-23: 3400 [IU] via INTRAVENOUS
  Filled 2019-06-23: qty 3400

## 2019-06-23 MED ORDER — VANCOMYCIN HCL 2000 MG/400ML IV SOLN
2000.0000 mg | Freq: Once | INTRAVENOUS | Status: DC
  Filled 2019-06-23: qty 400

## 2019-06-23 MED ORDER — LOPERAMIDE HCL 2 MG PO CAPS
2.0000 mg | ORAL_CAPSULE | ORAL | Status: DC | PRN
  Administered 2019-06-23 – 2019-06-25 (×3): 2 mg via ORAL
  Filled 2019-06-23 (×3): qty 1

## 2019-06-23 MED ORDER — VANCOMYCIN HCL 1500 MG/300ML IV SOLN
1500.0000 mg | Freq: Two times a day (BID) | INTRAVENOUS | Status: DC
  Filled 2019-06-23: qty 300

## 2019-06-23 MED ORDER — SODIUM CHLORIDE 0.9 % IV SOLN
2.0000 g | Freq: Two times a day (BID) | INTRAVENOUS | Status: DC
  Administered 2019-06-23 – 2019-06-25 (×5): 2 g via INTRAVENOUS
  Filled 2019-06-23 (×6): qty 2

## 2019-06-23 NOTE — Progress Notes (Signed)
Pt states he can not lie flat on his back, unable to get EKG.  Purpose of EKG explained to pt and pt refuses.

## 2019-06-23 NOTE — Progress Notes (Signed)
Pt resting with BiPAP self-applied.  Pt states he is tired and would like to defer taking his meds at this time.  Pt medicated for pain 8/10 chronic rib pain.

## 2019-06-23 NOTE — Consult Note (Signed)
Pharmacy Antibiotic Note  [name] [name] is a 59 y.o. male admitted on 06/22/2019 with lower leg cellulitis. Wound culture done on 06/01/2019 was growing MRSA and Enterobacter cloacae. Patient took doxycyline and bactrim with no response.  Pharmacy has been consulted for Cefepime  Dosing.  Patient has bee started on Linezolid.   Plan: Start Cefepime 2g IV every 8 hours.   Height: 5\' 8"  (172.7 cm) Weight: (!) 185.5 kg (408 lb 14.4 oz) IBW/kg (Calculated) : 68.4  Temp (24hrs), Avg:98.3 F (36.8 C), Min:97.9 F (36.6 C), Max:98.8 F (37.1 C)  Recent Labs  Lab 06/22/19 0852 06/23/19 0809  WBC 8.8 6.7  CREATININE 0.77 1.00    Estimated Creatinine Clearance: 131.2 mL/min (by C-G formula based on SCr of 1 mg/dL).    No Known Allergies  Antimicrobials this admission: 5/7 Ceftriaxone >> 5/8 5/8 Cefepime>> 5/8 Linezolid>>   Thank you for allowing pharmacy to be a part of this patient's care.  7/8, PharmD, BCPS Clinical Pharmacist 06/23/2019 2:22 PM

## 2019-06-23 NOTE — Progress Notes (Signed)
PROGRESS NOTE    [name] [name]  MRN:[id number] DOB: 03/30/1960 DOA: 06/22/2019 PCP: Flinchum, Michelle S, FNP   Brief Narrative:  [name] [name] is a 58 y.o. male with medical history significant of sCHF with EF of 25-30%, left bundle blockade, hypertension, hyperlipidemia, COPD, asthma, hypothyroidism, gout, depression, atrial fibrillation, PE/DVT on Coumadin, OSA on CPAP, CAD, iron deficiency anemia, who presents with shortness breath and some nausea.  He also has bilateral lower extremity cellulitis, left worse than right with multiple wounds.  Recently treated with outpatient antibiotic with no response.  Chest x-ray with cardiomegaly, vascular congestion and interstitial pulmonary edema.  Admitted for acute on chronic systolic heart failure..  Subjective: Patient was complaining of left upper quadrant pain with some nausea.  He was also very concerned about his lower extremity wounds, stating that they are having foul-smelling discharge now and not responding to the antibiotics he was given by wound clinic.  They were talking with him that he might need to go to the hospital for IV antibiotics.  Assessment & Plan:   Principal Problem:   Acute on chronic systolic CHF (congestive heart failure) (HCC) Active Problems:   COPD (chronic obstructive pulmonary disease) (HCC)   HTN (hypertension)   Personal history of DVT (deep vein thrombosis)   Atrial fibrillation with RVR (HCC)   Chest pain   PE (pulmonary thromboembolism) (HCC)   HLD (hyperlipidemia)   CAD (coronary artery disease)   Lower extremity cellulitis   Hypothyroidism   Iron deficiency anemia   Depression   Elevated troponin   Cellulitis due to methicillin-resistant Staphylococcus aureus (MRSA)  Acute on chronic systolic CHF (congestive heart failure) (HCC): Patient has 3+ leg edema, elevated BNP, chest x-ray showed cardiomegaly, vascular congestion and interstitial edema, clinically consistent with CHF exacerbation.  2D  echo on 03/24/2018 showed EF 25-30%. -Repeat echocardiogram done-pending results. -Patient is diuresing with IV Lasix 60 mg twice daily. -Continue with daily weights and strict intake and output. -Daily BMP  Lower extremity cellulitis.  Patient follow-up at a wound care center and was told there that he might need IV antibiotics.  Reviewed notes from wound care clinic.  Wound culture done on 06/01/2019 was growing MRSA and Enterobacter cloacae, he was treated first with doxycycline for 2 weeks and then with Bactrim with no response.  CRP elevated at 5.5 and ESR of 61.  No other systemic sign of infection.  Remained afebrile with no leukocytosis.  Blood cultures  Negative. Extensive bilateral lower extremity wounds, left worse than right. Wound care rewrapped him yesterday.  Noticed purulent discharge. -After discussing with pharmacy he was started on linezolid as he will not be a good candidate for vancomycin due to his weight requiring more monitoring. -Switch ceftriaxone with cefepime to cover Enterobacter. -Continue wound care. -Diuresis should also help. -PT/OT evaluation.  COPD (chronic obstructive pulmonary disease) (HCC):  And has mild wheezing on admission which has been resolved. -Switch Solu-Medrol with prednisone 50 mg daily for another 3 days. -Continue bronchodilators.  Elevated troponin/chest/LUQ Pain.  Opening elevated at 31 with flat curve most likely secondary to demand.  He was also having nonspecific left-sided lower chest pain, mostly involving left upper quadrant. -We will check lipase.  History of PE and DVT.  Patient was on Coumadin at home, he ran out of his meds for the past 5 days.  INR subtherapeutic on admission. -Restart Coumadin with heparin bridge.  Atrial fibrillation with RVR (HCC): Was given 10 mg of Cardizem in ED. Currently rate   controlled. -Continue metoprolol and Coumadin. -TSH was within normal limit.  Hypertension.  Blood pressure within  goal. -Continue home dose of lisinopril and metoprolol.  Hypothyroidism.  TSH within normal limit. -Continue Synthroid  HLD (hyperlipidemia) -Continue lipitor  Iron deficiency anemia: Hemoglobin 11.7 -Continue iron supplement -Follow-up with CBC  Depression: No SI or HI. -continue Cymbalta  Gout: -continue home allopurinol and Colchicine.  Morbid obesity. Body mass index is 62.17 kg/m.  -Will complicate overall prognosis.  Objective: Vitals:   06/22/19 1929 06/23/19 0501 06/23/19 0805 06/23/19 1056  BP: (!) 103/57 (!) 119/59 120/76   Pulse: 69 (!) 57 62 (!) 49  Resp: 18 20 20   Temp: 98.5 F (36.9 C) 98.2 F (36.8 C) 98.2 F (36.8 C)   TempSrc: Oral Oral Oral   SpO2: 95% 97% 97%   Weight:  (!) 185.5 kg    Height:        Intake/Output Summary (Last 24 hours) at 06/23/2019 1150 Last data filed at 06/23/2019 0945 Gross per 24 hour  Intake 1069.98 ml  Output 1700 ml  Net -630.02 ml   Filed Weights   06/22/19 0851 06/23/19 0501  Weight: (!) 182.3 kg (!) 185.5 kg    Examination:  General exam: Morbidly obese gentleman ,appears calm and comfortable  Respiratory system: Clear to auscultation. Respiratory effort normal. Cardiovascular system: S1 & S2 heard, RRR. No JVD, murmurs, rubs, gallops or clicks. Gastrointestinal system: Soft, nontender, nondistended, bowel sounds positive. Central nervous system: Alert and oriented. No focal neurological deficits. Extremities: 3+ LE edema, bilateral on a boost in place.  Psychiatry: Judgement and insight appear normal.    DVT prophylaxis: Coumadin Code Status: Full Family Communication: Discussed with patient Disposition Plan:  Status is: Inpatient  Remains inpatient appropriate because:IV treatments appropriate due to intensity of illness or inability to take PO   Dispo: The patient is from: Home              Anticipated d/c is to: To be determined              Anticipated d/c date is: 2 days               Patient currently is not medically stable to d/c.  Consultants:   None  Procedures:  Antimicrobials:  Zyvox Cefepime  Data Reviewed: I have personally reviewed following labs and imaging studies  CBC: Recent Labs  Lab 06/22/19 0852 06/23/19 0809  WBC 8.8 6.7  NEUTROABS 6.9  --   HGB 11.7* 12.4*  HCT 36.3* 39.0  MCV 87.9 88.2  PLT 255 252   Basic Metabolic Panel: Recent Labs  Lab 06/22/19 0852 06/23/19 0809  NA 139 135  K 3.9 3.9  CL 103 96*  CO2 24 26  GLUCOSE 142* 148*  BUN 6 11  CREATININE 0.77 1.00  CALCIUM 7.8* 8.4*  MG  --  1.7   GFR: Estimated Creatinine Clearance: 131.2 mL/min (by C-G formula based on SCr of 1 mg/dL). Liver Function Tests: Recent Labs  Lab 06/22/19 0852  AST 28  ALT 19  ALKPHOS 93  BILITOT 0.8  PROT 7.3  ALBUMIN 3.1*   No results for input(s): LIPASE, AMYLASE in the last 168 hours. No results for input(s): AMMONIA in the last 168 hours. Coagulation Profile: Recent Labs  Lab 06/22/19 1324 06/23/19 0809  INR 1.2 1.3*   Cardiac Enzymes: No results for input(s): CKTOTAL, CKMB, CKMBINDEX, TROPONINI in the last 168 hours. BNP (last 3 results) No results   for input(s): PROBNP in the last 8760 hours. HbA1C: No results for input(s): HGBA1C in the last 72 hours. CBG: No results for input(s): GLUCAP in the last 168 hours. Lipid Profile: Recent Labs    06/23/19 0809  CHOL 163  HDL 79  LDLCALC 65  TRIG 95  CHOLHDL 2.1   Thyroid Function Tests: Recent Labs    06/23/19 0809  TSH 4.098   Anemia Panel: No results for input(s): VITAMINB12, FOLATE, FERRITIN, TIBC, IRON, RETICCTPCT in the last 72 hours. Sepsis Labs: No results for input(s): PROCALCITON, LATICACIDVEN in the last 168 hours.  Recent Results (from the past 240 hour(s))  Blood culture (routine x 2)     Status: None (Preliminary result)   Collection Time: 06/22/19  8:52 AM   Specimen: BLOOD  Result Value Ref Range Status   Specimen Description BLOOD LEFT  ANTECUBITAL  Final   Special Requests   Final    BOTTLES DRAWN AEROBIC AND ANAEROBIC Blood Culture adequate volume   Culture   Final    NO GROWTH < 24 HOURS Performed at [facility], [address] [place], [place] [address]    Report Status PENDING  Incomplete  Respiratory Panel by RT PCR (Flu A&B, Covid) - Nasopharyngeal Swab     Status: None   Collection Time: 06/22/19  8:52 AM   Specimen: Nasopharyngeal Swab  Result Value Ref Range Status   SARS Coronavirus 2 by RT PCR NEGATIVE NEGATIVE Final    Comment: (NOTE) SARS-CoV-2 target nucleic acids are NOT DETECTED. The SARS-CoV-2 RNA is generally detectable in upper respiratoy specimens during the acute phase of infection. The lowest concentration of SARS-CoV-2 viral copies this assay can detect is 131 copies/mL. A negative result does not preclude SARS-Cov-2 infection and should not be used as the sole basis for treatment or other patient management decisions. A negative result may occur with  improper specimen collection/handling, submission of specimen other than nasopharyngeal swab, presence of viral mutation(s) within the areas targeted by this assay, and inadequate number of viral copies (<131 copies/mL). A negative result must be combined with clinical observations, patient history, and epidemiological information. The expected result is Negative. Fact Sheet for Patients:  https://www.fda.gov/media/142436/download Fact Sheet for Healthcare Providers:  https://www.fda.gov/media/142435/download This test is not yet ap proved or cleared by the [place] FDA and  has been authorized for detection and/or diagnosis of SARS-CoV-2 by FDA under an Emergency Use Authorization (EUA). This EUA will remain  in effect (meaning this test can be used) for the duration of the COVID-19 declaration under Section 564(b)(1) of the Act, 21 U.S.C. section 360bbb-3(b)(1), unless the authorization is terminated or revoked sooner.     Influenza A by PCR NEGATIVE NEGATIVE Final   Influenza B by PCR NEGATIVE NEGATIVE Final    Comment: (NOTE) The Xpert Xpress SARS-CoV-2/FLU/RSV assay is intended as an aid in  the diagnosis of influenza from Nasopharyngeal swab specimens and  should not be used as a sole basis for treatment. Nasal washings and  aspirates are unacceptable for Xpert Xpress SARS-CoV-2/FLU/RSV  testing. Fact Sheet for Patients: https://www.fda.gov/media/142436/download Fact Sheet for Healthcare Providers: https://www.fda.gov/media/142435/download This test is not yet approved or cleared by the [place] FDA and  has been authorized for detection and/or diagnosis of SARS-CoV-2 by  FDA under an Emergency Use Authorization (EUA). This EUA will remain  in effect (meaning this test can be used) for the duration of the  Covid-19 declaration under Section 564(b)(1) of the Act, 21    U.S.C. section 360bbb-3(b)(1), unless the authorization is  terminated or revoked. Performed at [facility], [address] [place], [place] [address]   Blood culture (routine x 2)     Status: None (Preliminary result)   Collection Time: 06/22/19  9:23 AM   Specimen: BLOOD  Result Value Ref Range Status   Specimen Description BLOOD RIGHT ANTECUBITAL  Final   Special Requests   Final    BOTTLES DRAWN AEROBIC AND ANAEROBIC Blood Culture adequate volume   Culture   Final    NO GROWTH < 24 HOURS Performed at [facility], [address], [place], [place] [address]    Report Status PENDING  Incomplete     Radiology Studies: DG Chest Portable 1 View  Result Date: 06/22/2019 CLINICAL DATA:  Shortness of breath EXAM: PORTABLE CHEST 1 VIEW COMPARISON:  03/04/2019 FINDINGS: 0907 hours. The cardio pericardial silhouette is enlarged. Pulmonary vascular congestion noted with interstitial pulmonary edema pattern. No dense focal airspace consolidation. No appreciable pleural effusion. The visualized bony structures  of the thorax are intact. Telemetry leads overlie the chest. IMPRESSION: Cardiomegaly with vascular congestion and probable interstitial edema. Electronically Signed   By: Misty Stanley M.D.   On: 06/22/2019 09:19    Scheduled Meds: . allopurinol  100 mg Oral Daily  . atorvastatin  40 mg Oral Daily  . DULoxetine  20 mg Oral Daily  . ferrous sulfate  325 mg Oral BID WC  . furosemide  60 mg Intravenous Q12H  . gabapentin  300 mg Oral TID  . ipratropium  0.5 mg Nebulization TID  . levothyroxine  50 mcg Oral QAC breakfast  . lisinopril  2.5 mg Oral Daily  . methylPREDNISolone (SOLU-MEDROL) injection  40 mg Intravenous Q12H  . metoprolol succinate  50 mg Oral Daily  . sodium chloride flush  3 mL Intravenous Q12H  . warfarin  8 mg Oral ONCE-1600  . Warfarin - Pharmacist Dosing Inpatient   Does not apply q1600   Continuous Infusions: . sodium chloride    . cefTRIAXone (ROCEPHIN)  IV Stopped (06/22/19 1832)  . heparin 1,950 Units/hr (06/23/19 0230)  . linezolid (ZYVOX) IV       LOS: 0 days   Time spent: 50 minutes.  I personally reviewed his chart.  Lorella Nimrod, MD [facility]  If 7PM-7AM, please contact night-coverage [email or web address]  06/23/2019, 11:50 AM   This record has been created using Systems analyst. Errors have been sought and corrected,but may not always be located. Such creation errors do not reflect on the standard of care.

## 2019-06-23 NOTE — Progress Notes (Signed)
ANTICOAGULATION CONSULT NOTE - Initial Consult  Pharmacy Consult for Warfarin with Heparin drip bridge Indication :hx pulmonary embolus/DVT and Afib  No Known Allergies  Patient Measurements: Height: 5\' 8"  (172.7 cm) Weight: (!) 185.5 kg (408 lb 14.4 oz) IBW/kg (Calculated) : 68.4 Heparin Dosing Weight:  114.6 kg  Vital Signs: Temp: 98.2 F (36.8 C) (05/08 0805) Temp Source: Oral (05/08 0805) BP: 120/76 (05/08 0805) Pulse Rate: 49 (05/08 1056)  Labs: Recent Labs    06/22/19 0852 06/22/19 0852 06/22/19 1324 06/22/19 1453 06/22/19 1713 06/23/19 0019 06/23/19 0809 06/23/19 1421  HGB 11.7*  --   --   --   --   --  12.4*  --   HCT 36.3*  --   --   --   --   --  39.0  --   PLT 255  --   --   --   --   --  252  --   APTT  --   --  29  --   --   --   --   --   LABPROT  --   --  14.5  --   --   --  15.2  --   INR  --   --  1.2  --   --   --  1.3*  --   HEPARINUNFRC  --   --   --   --   --  <0.10* 0.51 0.35  CREATININE 0.77  --   --   --   --   --  1.00  --   TROPONINIHS 22*   < > 31* 32* 31*  --   --   --    < > = values in this interval not displayed.    Estimated Creatinine Clearance: 131.2 mL/min (by C-G formula based on SCr of 1 mg/dL).   Medical History: Past Medical History:  Diagnosis Date  . Anxiety   . Arthritis   . Asthma   . Brain damage   . Chronic pain of both knees 07/13/2018  . Clotting disorder ([place])   . COPD (chronic obstructive pulmonary disease) ([place])   . Depression   . HFrEF (heart failure with reduced ejection fraction) ([place])    a. 03/2018 Echo: EF 25-30%, diff HK. Mod LAE.  [name] History of DVT (deep vein thrombosis)   . History of pulmonary embolism    a. Chronic coumadin.  [name] Hypertension   . MI (myocardial infarction) ([place])   . Morbid obesity ([place])   . Neck pain 07/13/2018  . NICM (nonischemic cardiomyopathy) ([place])    a. s/p Cath x 3 - reportedly nl cors. Last cath 2019 in [place]; b. a. 03/2018 Echo: EF 25-30%, diff HK.  [name] Persistent atrial  fibrillation ([place])    a. 03/2018 s/p DCCV; b. CHA2DS2VASc = 1-->Xarelto (later changed to warfarin); c. 05/2018 recurrent afib-->Amio initiated.  . Sleep apnea     Medications:  Scheduled:  . allopurinol  100 mg Oral Daily  . atorvastatin  40 mg Oral Daily  . DULoxetine  20 mg Oral Daily  . ferrous sulfate  325 mg Oral BID WC  . furosemide  60 mg Intravenous Q12H  . gabapentin  300 mg Oral TID  . ipratropium  0.5 mg Nebulization TID  . levothyroxine  50 mcg Oral QAC breakfast  . lisinopril  2.5 mg Oral Daily  . metoprolol succinate  50 mg Oral Daily  . [START ON 06/24/2019] predniSONE  50 mg Oral Q breakfast  . sodium chloride flush  3 mL Intravenous Q12H  . warfarin  8 mg Oral ONCE-1600  . Warfarin - Pharmacist Dosing Inpatient   Does not apply q1600   Infusions:  . sodium chloride    . ceFEPime (MAXIPIME) IV 2 g (06/23/19 1429)  . heparin 1,950 Units/hr (06/23/19 1229)  . linezolid (ZYVOX) IV 600 mg (06/23/19 1243)    Assessment: 59 yo male on Warfarin PTA for DVT/PE, Afib.  -Patient states that he ran out of his Coumadin, did not take Coumadin for 5 days. Patient states he takes 10 mg (#2 tabs of 5 mg) daily.  Per cards note in Careeverywhere:Mr. Wollschlager does not follow-up consistently for warfarin monitoring,Had PE while on NOAC so switch to warfarin. Hgb 11.7  Plt 255  INR 1.2  aptt 29  DATE INR DOSE 5/7 1.2   Warfarin 10mg   5/8 1.3  Goal of Therapy:  INR 2-3 Heparin level 0.3-0.7 units/ml Monitor platelets by anticoagulation protocol: Yes   Plan:  HEPARIN: 05/08 @ 0809 HL 0.51. 05/08 @ 1421 HL 0.35. Level is therapeutic x 2.  Will continue current heparin rate of  1950 units/hr and will recheck  HL with AM labs. Recheck CBC w/ am labs and continue to monitor.  WARFARIN: INR subtherapeutic. Will order warfarin 8mg  X 1 dose  tonight. F/U INR with AM labs   07/08, PharmD, BCPS Clinical Pharmacist 06/23/2019 2:46 PM

## 2019-06-23 NOTE — Progress Notes (Signed)
*  PRELIMINARY RESULTS* Echocardiogram 2D Echocardiogram has been performed.  [name] [name] 06/23/2019, 9:32 AM

## 2019-06-23 NOTE — Progress Notes (Signed)
ANTICOAGULATION CONSULT NOTE - Initial Consult  Pharmacy Consult for Warfarin with Heparin drip bridge Indication :hx pulmonary embolus/DVT and Afib  No Known Allergies  Patient Measurements: Height: 5\' 8"  (172.7 cm) Weight: (!) 182.3 kg (402 lb) IBW/kg (Calculated) : 68.4 Heparin Dosing Weight:  114.6 kg  Vital Signs: Temp: 98.5 F (36.9 C) (05/07 1929) Temp Source: Oral (05/07 1929) BP: 103/57 (05/07 1929) Pulse Rate: 69 (05/07 1929)  Labs: Recent Labs    06/22/19 0852 06/22/19 0852 06/22/19 1324 06/22/19 1453 06/22/19 1713 06/23/19 0019  HGB 11.7*  --   --   --   --   --   HCT 36.3*  --   --   --   --   --   PLT 255  --   --   --   --   --   APTT  --   --  29  --   --   --   LABPROT  --   --  14.5  --   --   --   INR  --   --  1.2  --   --   --   HEPARINUNFRC  --   --   --   --   --  <0.10*  CREATININE 0.77  --   --   --   --   --   TROPONINIHS 22*   < > 31* 32* 31*  --    < > = values in this interval not displayed.    Estimated Creatinine Clearance: 162.3 mL/min (by C-G formula based on SCr of 0.77 mg/dL).   Medical History: Past Medical History:  Diagnosis Date  . Anxiety   . Arthritis   . Asthma   . Brain damage   . Chronic pain of both knees 07/13/2018  . Clotting disorder (HCC)   . COPD (chronic obstructive pulmonary disease) (HCC)   . Depression   . HFrEF (heart failure with reduced ejection fraction) (HCC)    a. 03/2018 Echo: EF 25-30%, diff HK. Mod LAE.  04/2018 History of DVT (deep vein thrombosis)   . History of pulmonary embolism    a. Chronic coumadin.  [name] Hypertension   . MI (myocardial infarction) (HCC)   . Morbid obesity (HCC)   . Neck pain 07/13/2018  . NICM (nonischemic cardiomyopathy) (HCC)    a. s/p Cath x 3 - reportedly nl cors. Last cath 2019 in [place]; b. a. 03/2018 Echo: EF 25-30%, diff HK.  04/2018 Persistent atrial fibrillation (HCC)    a. 03/2018 s/p DCCV; b. CHA2DS2VASc = 1-->Xarelto (later changed to warfarin); c. 05/2018 recurrent  afib-->Amio initiated.  . Sleep apnea     Medications:  Scheduled:  . allopurinol  100 mg Oral Daily  . atorvastatin  40 mg Oral Daily  . DULoxetine  20 mg Oral Daily  . ferrous sulfate  325 mg Oral BID WC  . furosemide  60 mg Intravenous Q12H  . gabapentin  300 mg Oral TID  . heparin  3,400 Units Intravenous Once  . ipratropium  0.5 mg Nebulization TID  . levothyroxine  50 mcg Oral QAC breakfast  . lisinopril  2.5 mg Oral Daily  . methylPREDNISolone (SOLU-MEDROL) injection  40 mg Intravenous Q12H  . metoprolol succinate  50 mg Oral Daily  . sodium chloride flush  3 mL Intravenous Q12H  . Warfarin - Pharmacist Dosing Inpatient   Does not apply q1600   Infusions:  . sodium chloride    .  cefTRIAXone (ROCEPHIN)  IV 200 mL/hr at 06/22/19 1800  . heparin 1,700 Units/hr (06/23/19 0100)    Assessment: 59 yo male on Warfarin PTA for DVT/PE, Afib.  -Patient states that he ran out of his Coumadin, did not take Coumadin for 5 days. Patient states he takes 10 mg (#2 tabs of 5 mg) daily.  Per cards note in Careeverywhere:Mr. Kluck does not follow-up consistently for warfarin monitoring,Had PE while on NOAC so switch to warfarin. Hgb 11.7  Plt 255  INR 1.2  aptt 29   5/7 INR  1.2    Goal of Therapy:  INR 2-3 Heparin level 0.3-0.7 units/ml Monitor platelets by anticoagulation protocol: Yes   Plan:  05/08 @ 0019 HL < 0.10 subtherapeutic. Per RN drip was not stopped or held. Will rebolus heparin 3400 units IV x 1 and increase rate to 1950 units/hr and will recheck HL at 0800 and recheck CBC w/ am labs and continue to monitor.  Tobie Lords, PharmD, BCPS Clinical Pharmacist 06/23/2019,1:51 AM

## 2019-06-23 NOTE — Progress Notes (Signed)
ANTICOAGULATION CONSULT NOTE - Initial Consult  Pharmacy Consult for Warfarin with Heparin drip bridge Indication :hx pulmonary embolus/DVT and Afib  No Known Allergies  Patient Measurements: Height: 5\' 8"  (172.7 cm) Weight: (!) 185.5 kg (408 lb 14.4 oz) IBW/kg (Calculated) : 68.4 Heparin Dosing Weight:  114.6 kg  Vital Signs: Temp: 98.2 F (36.8 C) (05/08 0805) Temp Source: Oral (05/08 0805) BP: 120/76 (05/08 0805) Pulse Rate: 62 (05/08 0805)  Labs: Recent Labs    06/22/19 0852 06/22/19 0852 06/22/19 1324 06/22/19 1453 06/22/19 1713 06/23/19 0019 06/23/19 0809  HGB 11.7*  --   --   --   --   --  12.4*  HCT 36.3*  --   --   --   --   --  39.0  PLT 255  --   --   --   --   --  252  APTT  --   --  29  --   --   --   --   LABPROT  --   --  14.5  --   --   --  15.2  INR  --   --  1.2  --   --   --  1.3*  HEPARINUNFRC  --   --   --   --   --  <0.10* 0.51  CREATININE 0.77  --   --   --   --   --  1.00  TROPONINIHS 22*   < > 31* 32* 31*  --   --    < > = values in this interval not displayed.    Estimated Creatinine Clearance: 131.2 mL/min (by C-G formula based on SCr of 1 mg/dL).   Medical History: Past Medical History:  Diagnosis Date  . Anxiety   . Arthritis   . Asthma   . Brain damage   . Chronic pain of both knees 07/13/2018  . Clotting disorder ([place])   . COPD (chronic obstructive pulmonary disease) ([place])   . Depression   . HFrEF (heart failure with reduced ejection fraction) ([place])    a. 03/2018 Echo: EF 25-30%, diff HK. Mod LAE.  [name] History of DVT (deep vein thrombosis)   . History of pulmonary embolism    a. Chronic coumadin.  [name] Hypertension   . MI (myocardial infarction) ([place])   . Morbid obesity ([place])   . Neck pain 07/13/2018  . NICM (nonischemic cardiomyopathy) ([place])    a. s/p Cath x 3 - reportedly nl cors. Last cath 2019 in [place]; b. a. 03/2018 Echo: EF 25-30%, diff HK.  [name] Persistent atrial fibrillation ([place])    a. 03/2018 s/p DCCV; b. CHA2DS2VASc =  1-->Xarelto (later changed to warfarin); c. 05/2018 recurrent afib-->Amio initiated.  . Sleep apnea     Medications:  Scheduled:  . allopurinol  100 mg Oral Daily  . atorvastatin  40 mg Oral Daily  . DULoxetine  20 mg Oral Daily  . ferrous sulfate  325 mg Oral BID WC  . furosemide  60 mg Intravenous Q12H  . gabapentin  300 mg Oral TID  . ipratropium  0.5 mg Nebulization TID  . levothyroxine  50 mcg Oral QAC breakfast  . lisinopril  2.5 mg Oral Daily  . methylPREDNISolone (SOLU-MEDROL) injection  40 mg Intravenous Q12H  . metoprolol succinate  50 mg Oral Daily  . sodium chloride flush  3 mL Intravenous Q12H  . Warfarin - Pharmacist Dosing Inpatient   Does not apply [id number]  Infusions:  . sodium chloride    . cefTRIAXone (ROCEPHIN)  IV Stopped (06/22/19 1832)  . heparin 1,950 Units/hr (06/23/19 0230)    Assessment: 59 yo male on Warfarin PTA for DVT/PE, Afib.  -Patient states that he ran out of his Coumadin, did not take Coumadin for 5 days. Patient states he takes 10 mg (#2 tabs of 5 mg) daily.  Per cards note in Careeverywhere:Mr. Hale does not follow-up consistently for warfarin monitoring,Had PE while on NOAC so switch to warfarin. Hgb 11.7  Plt 255  INR 1.2  aptt 29  DATE INR DOSE 5/7 1.2   Warfarin 10mg   5/8 1.3  Goal of Therapy:  INR 2-3 Heparin level 0.3-0.7 units/ml Monitor platelets by anticoagulation protocol: Yes   Plan:  HEPARIN: 05/08 @ 0809 HL 0.51 . Will continue current heparin rate of  1950 units/hr and will recheck confirmatory HL in 6 hours. Recheck CBC w/ am labs and continue to monitor.  WARFARIN: INR subtherapeutic. Will order warfarin 8mg  X 1 dose  tonight. F/U INR with AM labs   07/08, PharmD, BCPS Clinical Pharmacist 06/23/2019 9:35 AM

## 2019-06-23 NOTE — Plan of Care (Signed)
Pt has frank rectal bleeding from hemorrhoids.   Problem: Education: Goal: Knowledge of General Education information will improve Description: Including pain rating scale, medication(s)/side effects and non-pharmacologic comfort measures Outcome: Progressing   Problem: Health Behavior/Discharge Planning: Goal: Ability to manage health-related needs will improve Outcome: Progressing   Problem: Clinical Measurements: Goal: Ability to maintain clinical measurements within normal limits will improve Outcome: Progressing Goal: Will remain free from infection Outcome: Progressing Goal: Diagnostic test results will improve Outcome: Progressing Goal: Respiratory complications will improve Outcome: Progressing Goal: Cardiovascular complication will be avoided Outcome: Progressing   Problem: Activity: Goal: Risk for activity intolerance will decrease Outcome: Progressing   Problem: Nutrition: Goal: Adequate nutrition will be maintained Outcome: Progressing   Problem: Coping: Goal: Level of anxiety will decrease Outcome: Progressing   Problem: Elimination: Goal: Will not experience complications related to bowel motility Outcome: Progressing Goal: Will not experience complications related to urinary retention Outcome: Progressing   Problem: Pain Managment: Goal: General experience of comfort will improve Outcome: Progressing   Problem: Safety: Goal: Ability to remain free from injury will improve Outcome: Progressing   Problem: Skin Integrity: Goal: Risk for impaired skin integrity will decrease Outcome: Progressing

## 2019-06-23 NOTE — Progress Notes (Signed)
PT Cancellation Note  Patient Details Name: [name] [name] MRN: [id number] DOB: November 28, 1960   Cancelled Treatment:    Reason Eval/Treat Not Completed: (Consult received and chart reviewed.  Pt noted with hx of PE/DVT. Admitted with SOB, cough, and chest pain. Pt noted with medication non-compliance with warfarin 2/2 running out of meds. Was started on heparin drip 2/2 subtherapeutic INR levels (initiated 0208 on 06/23/19). Per RN concern for acute PE. Per protocols will hold OT evaluation until pt 48 hours s/p initiation of heparin drip. Will continue to follow remotely and initiate services as available and pt medically appropriate for OT eval.)   Daily Doe H. Manson Passey, PT, DPT, NCS 06/23/19, 9:25 AM [phone]

## 2019-06-23 NOTE — Progress Notes (Signed)
OT Cancellation Note  Patient Details Name: [name] [name] MRN: [id number] DOB: 09/17/1960   Cancelled Treatment:    Reason Eval/Treat Not Completed: Medical issues which prohibited therapy;Other (comment). Thank you for the OT consult. Order received and chart reviewed. Pt noted with hx of PE/DVT. Admitted with SOB, cough, and chest pain. Pt noted with medication non-compliance with warfarin 2/2 running out of meds. Was started on heparin drip 2/2 subtherapeutic INR levels (initiated 0208 on 06/23/19). Per RN concern for acute PE. Per protocols will hold OT evaluation until pt 48 hours s/p initiation of heparin drip. Will continue to follow remotely and initiate services as available and pt medically appropriate for OT eval.   Rockney Ghee, M.S., OTR/L Ascom: [phone] 06/23/19, 9:16 AM

## 2019-06-24 LAB — CBC
HCT: 32.9 % — ABNORMAL LOW (ref 39.0–52.0)
Hemoglobin: 10.7 g/dL — ABNORMAL LOW (ref 13.0–17.0)
MCH: 28.5 pg (ref 26.0–34.0)
MCHC: 32.5 g/dL (ref 30.0–36.0)
MCV: 87.5 fL (ref 80.0–100.0)
Platelets: 173 10*3/uL (ref 150–400)
RBC: 3.76 MIL/uL — ABNORMAL LOW (ref 4.22–5.81)
RDW: 18.4 % — ABNORMAL HIGH (ref 11.5–15.5)
WBC: 5.8 10*3/uL (ref 4.0–10.5)
nRBC: 0 % (ref 0.0–0.2)

## 2019-06-24 LAB — URINALYSIS, COMPLETE (UACMP) WITH MICROSCOPIC
Bilirubin Urine: NEGATIVE
Glucose, UA: NEGATIVE mg/dL
Hgb urine dipstick: NEGATIVE
Ketones, ur: NEGATIVE mg/dL
Nitrite: NEGATIVE
Protein, ur: NEGATIVE mg/dL
Specific Gravity, Urine: 1.025 (ref 1.005–1.030)
pH: 6 (ref 5.0–8.0)

## 2019-06-24 LAB — BASIC METABOLIC PANEL
Anion gap: 10 (ref 5–15)
BUN: 20 mg/dL (ref 6–20)
CO2: 26 mmol/L (ref 22–32)
Calcium: 8.5 mg/dL — ABNORMAL LOW (ref 8.9–10.3)
Chloride: 100 mmol/L (ref 98–111)
Creatinine, Ser: 1.12 mg/dL (ref 0.61–1.24)
GFR calc Af Amer: >60 mL/min (ref >60)
GFR calc non Af Amer: >60 mL/min (ref >60)
Glucose, Bld: 205 mg/dL — ABNORMAL HIGH (ref 70–99)
Potassium: 3.8 mmol/L (ref 3.5–5.1)
Sodium: 136 mmol/L (ref 135–145)

## 2019-06-24 LAB — URINE DRUG SCREEN, QUALITATIVE (ARMC ONLY)
Amphetamines, Ur Screen: NOT DETECTED
Barbiturates, Ur Screen: NOT DETECTED
Benzodiazepine, Ur Scrn: NOT DETECTED
Cannabinoid 50 Ng, Ur ~~LOC~~: NOT DETECTED
Cocaine Metabolite,Ur ~~LOC~~: NOT DETECTED
MDMA (Ecstasy)Ur Screen: NOT DETECTED
Methadone Scn, Ur: NOT DETECTED
Opiate, Ur Screen: POSITIVE — AB
Phencyclidine (PCP) Ur S: NOT DETECTED
Tricyclic, Ur Screen: NOT DETECTED

## 2019-06-24 LAB — PROTIME-INR
INR: 1.4 — ABNORMAL HIGH (ref 0.8–1.2)
Prothrombin Time: 16.5 seconds — ABNORMAL HIGH (ref 11.4–15.2)

## 2019-06-24 LAB — HEPARIN LEVEL (UNFRACTIONATED): Heparin Unfractionated: 0.25 IU/mL — ABNORMAL LOW (ref 0.30–0.70)

## 2019-06-24 MED ORDER — HYDROCODONE-ACETAMINOPHEN 5-325 MG PO TABS
1.0000 | ORAL_TABLET | Freq: Four times a day (QID) | ORAL | Status: DC | PRN
  Administered 2019-06-24 – 2019-06-26 (×6): 2 via ORAL
  Filled 2019-06-24 (×2): qty 1
  Filled 2019-06-24 (×5): qty 2

## 2019-06-24 MED ORDER — FUROSEMIDE 10 MG/ML IJ SOLN
80.0000 mg | Freq: Two times a day (BID) | INTRAMUSCULAR | Status: DC
  Administered 2019-06-24 – 2019-06-26 (×4): 80 mg via INTRAVENOUS
  Filled 2019-06-24 (×4): qty 8

## 2019-06-24 MED ORDER — ENOXAPARIN SODIUM 150 MG/ML ~~LOC~~ SOLN
150.0000 mg | Freq: Two times a day (BID) | SUBCUTANEOUS | Status: DC
  Administered 2019-06-24 – 2019-06-26 (×4): 150 mg via SUBCUTANEOUS
  Filled 2019-06-24 (×6): qty 1

## 2019-06-24 MED ORDER — ALPRAZOLAM 0.5 MG PO TABS
0.5000 mg | ORAL_TABLET | Freq: Two times a day (BID) | ORAL | Status: DC | PRN
  Administered 2019-06-25 (×2): 0.5 mg via ORAL
  Filled 2019-06-24 (×2): qty 1

## 2019-06-24 MED ORDER — HEPARIN BOLUS VIA INFUSION
1800.0000 [IU] | Freq: Once | INTRAVENOUS | Status: AC
  Administered 2019-06-24: 07:00:00 1800 [IU] via INTRAVENOUS
  Filled 2019-06-24: qty 1800

## 2019-06-24 MED ORDER — WARFARIN SODIUM 10 MG PO TABS
10.0000 mg | ORAL_TABLET | Freq: Once | ORAL | Status: AC
  Administered 2019-06-24: 10 mg via ORAL
  Filled 2019-06-24: qty 1

## 2019-06-24 NOTE — Progress Notes (Signed)
MD notified. Lab attempts to draw aptt/inr without success. Lab calls co-worker which is also unable to obtain draw. MD states INR may be evaluated thru fingerstick I will continue to assess.

## 2019-06-24 NOTE — Progress Notes (Signed)
Pts HR and resp increase when pt is OOB. Pt up to [facility] or [facility] and HR increases to 140bpm and DOE noted. Once back into chair pt's hr and resp return to normal level for patient. MD notified verbal. No new orders at this time. Once HR and resp recorded mews is now green. I will continue to assess.

## 2019-06-24 NOTE — Progress Notes (Signed)
PT Cancellation Note  Patient Details Name: [name] [name] MRN: [id number] DOB: August 02, 1960   Cancelled Treatment:    Reason Eval/Treat Not Completed: (Chart reviewed for re-attempt at evaluation.  Patient just received meal tray; will re-attempt evaluation next date as appropriate and available.)   Roxann Vierra H. Manson Passey, PT, DPT, NCS 06/24/19, 4:44 PM [phone]

## 2019-06-24 NOTE — Progress Notes (Signed)
ANTICOAGULATION CONSULT NOTE - Initial Consult  Pharmacy Consult for Warfarin with Heparin drip bridge Indication :hx pulmonary embolus/DVT and Afib  No Known Allergies  Patient Measurements: Height: 5\' 8"  (172.7 cm) Weight: (!) 195.1 kg (430 lb 3.2 oz) IBW/kg (Calculated) : 68.4 Heparin Dosing Weight:  114.6 kg  Vital Signs: Temp: 97.7 F (36.5 C) (05/09 0715) Temp Source: Oral (05/09 0715) BP: 94/51 (05/09 0715) Pulse Rate: 64 (05/09 0715)  Labs: Recent Labs     0000 06/22/19 0852 06/22/19 0852 06/22/19 1324 06/22/19 1453 06/22/19 1713 06/23/19 0019 06/23/19 0809 06/23/19 1421 06/24/19 0558  HGB   < > 11.7*  --   --   --   --   --  12.4*  --  10.7*  HCT  --  36.3*  --   --   --   --   --  39.0  --  32.9*  PLT  --  255  --   --   --   --   --  252  --  173  APTT  --   --   --  29  --   --   --   --   --   --   LABPROT  --   --   --  14.5  --   --   --  15.2  --  16.5*  INR  --   --   --  1.2  --   --   --  1.3*  --  1.4*  HEPARINUNFRC  --   --   --   --   --   --    < > 0.51 0.35 0.25*  CREATININE  --  0.77  --   --   --   --   --  1.00  --  1.12  TROPONINIHS  --  22*   < > 31* 32* 31*  --   --   --   --    < > = values in this interval not displayed.    Estimated Creatinine Clearance: 121.1 mL/min (by C-G formula based on SCr of 1.12 mg/dL).   Medical History: Past Medical History:  Diagnosis Date  . Anxiety   . Arthritis   . Asthma   . Brain damage   . Chronic pain of both knees 07/13/2018  . Clotting disorder ([facility])   . COPD (chronic obstructive pulmonary disease) ([facility])   . Depression   . HFrEF (heart failure with reduced ejection fraction) ([facility])    a. 03/2018 Echo: EF 25-30%, diff HK. Mod LAE.  04/2018 History of DVT (deep vein thrombosis)   . History of pulmonary embolism    a. Chronic coumadin.  [name] Hypertension   . MI (myocardial infarction) ([facility])   . Morbid obesity ([facility])   . Neck pain 07/13/2018  . NICM (nonischemic cardiomyopathy) ([facility])    a. s/p  Cath x 3 - reportedly nl cors. Last cath 2019 in [place]; b. a. 03/2018 Echo: EF 25-30%, diff HK.  04/2018 Persistent atrial fibrillation ([facility])    a. 03/2018 s/p DCCV; b. CHA2DS2VASc = 1-->Xarelto (later changed to warfarin); c. 05/2018 recurrent afib-->Amio initiated.  . Sleep apnea     Medications:  Scheduled:  . allopurinol  100 mg Oral Daily  . atorvastatin  40 mg Oral Daily  . DULoxetine  20 mg Oral Daily  . ferrous sulfate  325 mg Oral BID WC  . furosemide  80 mg Intravenous Q12H  .  gabapentin  300 mg Oral TID  . ipratropium  0.5 mg Nebulization TID  . levothyroxine  50 mcg Oral QAC breakfast  . lisinopril  2.5 mg Oral Daily  . metoprolol succinate  50 mg Oral Daily  . predniSONE  50 mg Oral Q breakfast  . Warfarin - Pharmacist Dosing Inpatient   Does not apply q1600   Infusions:  . ceFEPime (MAXIPIME) IV Stopped (06/23/19 2248)  . heparin 2,150 Units/hr (06/24/19 0729)  . linezolid (ZYVOX) IV 600 mg (06/23/19 2247)    Assessment: 59 yo male on Warfarin PTA for DVT/PE, Afib.  -Patient states that he ran out of his Coumadin, did not take Coumadin for 5 days. Patient states he takes 10 mg (#2 tabs of 5 mg) daily.  Per cards note in Careeverywhere:Mr. Mozer does not follow-up consistently for warfarin monitoring,Had PE while on NOAC so switch to warfarin. Pt is currently on cefepime and linezolid.    DATE INR DOSE 5/7 1.2       10mg   5/8 1.3         8mg   5/9       1.4       10mg   Goal of Therapy:  INR 2-3 Heparin level 0.3-0.7 units/ml Monitor platelets by anticoagulation protocol: Yes   Plan:  HEPARIN:  05/09 @ 0600 HL 0.25 subtherapeutic. Per RN drip not stopped or interrupted. Will rebolus w/ heparin 1800 units IV x 1 and increase rate to 2150 units/hr and will recheck HL at 1300, CBC trending down, will continue to monitor. Will need to continue warfarin for at least 5 days and until INR is > 2 for 24 hours.   WARFARIN: INR subtherapeutic. Will order warfarin 10 mg X 1 dose   tonight. F/U INR with AM labs. CBC daily.   Oswald Hillock, PharmD, BCPS Clinical Pharmacist 06/24/2019 9:41 AM

## 2019-06-24 NOTE — Progress Notes (Signed)
PROGRESS NOTE    [name] [name]  XTG:[id number] DOB: Jun 20, 1960 DOA: 06/22/2019 PCP: Doreen Beam, FNP   Brief Narrative:  [name] is a 59 y.o. male with medical history significant of sCHF with EF of 25-30%, left bundle blockade, hypertension, hyperlipidemia, COPD, asthma, hypothyroidism, gout, depression, atrial fibrillation, PE/DVT on Coumadin, OSA on CPAP, CAD, iron deficiency anemia, who presents with shortness breath and some nausea.  He also has bilateral lower extremity cellulitis, left worse than right with multiple wounds.  Recently treated with outpatient antibiotic with no response.  Chest x-ray with cardiomegaly, vascular congestion and interstitial pulmonary edema.  Admitted for acute on chronic systolic heart failure..  Subjective: Patient was asking something for anxiety.  According to him that he feels normal and all of a sudden become short of breath while sitting comfortably which he relates to his anxiety.  Explained that it might be due to him being overweight and hypoventilating.  Assessment & Plan:   Principal Problem:   Acute on chronic systolic CHF (congestive heart failure) (HCC) Active Problems:   COPD (chronic obstructive pulmonary disease) (HCC)   HTN (hypertension)   Personal history of DVT (deep vein thrombosis)   Atrial fibrillation with RVR (HCC)   Chest pain   PE (pulmonary thromboembolism) (HCC)   HLD (hyperlipidemia)   CAD (coronary artery disease)   Lower extremity cellulitis   Hypothyroidism   Iron deficiency anemia   Depression   Elevated troponin   Cellulitis due to methicillin-resistant Staphylococcus aureus (MRSA)  Acute on chronic systolic CHF (congestive heart failure) [facility]): Patient has 3+ leg edema, elevated BNP, chest x-ray showed cardiomegaly, vascular congestion and interstitial edema, clinically consistent with CHF exacerbation.  2D echo on 03/24/2018 showed EF 25-30%. -Repeat echocardiogram -with normal EF and grade 1  diastolic dysfunction. -Weight increased despite being on Lasix.  Continue to have significant lower extremity edema. -Increase IV Lasix to 80 mg twice daily. -Continue with daily weights and strict intake and output. -Daily BMP  Lower extremity cellulitis.  Patient follow-up at a wound care center and was told there that he might need IV antibiotics.  Reviewed notes from wound care clinic.  Wound culture done on 06/01/2019 was growing MRSA and Enterobacter cloacae, he was treated first with doxycycline for 2 weeks and then with Bactrim with no response.  CRP elevated at 5.5 and ESR of 61.  No other systemic sign of infection.  Remained afebrile with no leukocytosis.  Blood cultures  Negative. Extensive bilateral lower extremity wounds, left worse than right. Wound care rewrapped him yesterday.  Noticed purulent discharge. -After discussing with pharmacy he was started on linezolid as he will not be a good candidate for vancomycin due to his weight requiring more monitoring. -Switch ceftriaxone with cefepime to cover Enterobacter. -Continue current antibiotics. -Continue wound care. -Diuresis should also help. -PT/OT evaluation.  COPD (chronic obstructive pulmonary disease) ([place]):  And has mild wheezing on admission which has been resolved. -Continue prednisone 50 mg daily for another 2 days. -Continue bronchodilators.  Elevated troponin/chest/LUQ Pain.  Opening elevated at 31 with flat curve most likely secondary to demand.  He was also having nonspecific left-sided lower chest pain, mostly involving left upper quadrant. -We will check lipase-normal. -Pain resolved today.  History of PE and DVT.  Patient was on Coumadin at home, he ran out of his meds for the past 5 days.  INR subtherapeutic on admission. -Restart Coumadin with heparin bridge.  Atrial fibrillation with RVR ([place]): Was given 10  mg of Cardizem in ED. Currently rate controlled. -Continue metoprolol and Coumadin. -TSH was  within normal limit.  Hypertension.  Blood pressure within goal. -Continue home dose of lisinopril and metoprolol.  Hypothyroidism.  TSH within normal limit. -Continue Synthroid  HLD (hyperlipidemia) -Continue lipitor  Iron deficiency anemia: Hemoglobin 11.7 -Continue iron supplement -Follow-up with CBC  Depression: No SI or HI. -continue Cymbalta  Gout: -continue home allopurinol and Colchicine.  Morbid obesity. Body mass index is 65.41 kg/m.  -Will complicate overall prognosis.  Objective: Vitals:   06/24/19 0442 06/24/19 0715 06/24/19 1051 06/24/19 1147  BP: (!) 169/133 (!) 94/51  127/84  Pulse: (!) 108 64  88  Resp:  _0 Temp: 97.8 F (36.6 C) 97.7 F (36.5 C)  98 F (36.7 C)  TempSrc: Oral Oral  Oral  SpO2: 95% 97%  99%  Weight: (!) 195.1 kg     Height:        Intake/Output Summary (Last 24 hours) at 06/24/2019 1331 Last data filed at 06/24/2019 1014 Gross per 24 hour  Intake 3717.91 ml  Output 4975 ml  Net -1257.09 ml   Filed Weights   06/22/19 0851 06/23/19 0501 06/24/19 0442  Weight: (!) 182.3 kg (!) 185.5 kg (!) 195.1 kg    Examination:  General exam: Morbidly obese gentleman ,appears calm and comfortable  Respiratory system: Clear to auscultation. Respiratory effort normal. Cardiovascular system: S1 & S2 heard, RRR. No JVD, murmurs, rubs, gallops or clicks. Gastrointestinal system: Soft, nontender, nondistended, bowel sounds positive. Central nervous system: Alert and oriented. No focal neurological deficits. Extremities: 3+ LE edema, bilateral unna boots in place.  Psychiatry: Judgement and insight appear normal.    DVT prophylaxis: Coumadin Code Status: Full Family Communication: Discussed with patient Disposition Plan:  Status is: Inpatient  Remains inpatient appropriate because:IV treatments appropriate due to intensity of illness or inability to take PO   Dispo: The patient is from: Home              Anticipated d/c is to:  To be determined              Anticipated d/c date is: 2 days              Patient currently is not medically stable to d/c.  Patient might need placement for IV antibiotics as lives alone and will not be able to managed himself.  Consultants:   None  Procedures:  Antimicrobials:  Zyvox Cefepime  Data Reviewed: I have personally reviewed following labs and imaging studies  CBC: Recent Labs  Lab 06/22/19 0852 06/23/19 0809 06/24/19 0558  WBC 8.8 6.7 5.8  NEUTROABS 6.9  --   --   HGB 11.7* 12.4* 10.7*  HCT 36.3* 39.0 32.9*  MCV 87.9 88.2 87.5  PLT 255 252 254   Basic Metabolic Panel: Recent Labs  Lab 06/22/19 0852 06/23/19 0809 06/24/19 0558  NA 139 135 136  K 3.9 3.9 3.8  CL 103 96* 100  CO2 _1 GLUCOSE 142* 148* 205*  BUN _2 CREATININE 0.77 1.00 1.12  CALCIUM 7.8* 8.4* 8.5*  MG  --  1.7  --    GFR: Estimated Creatinine Clearance: 121.1 mL/min (by C-G formula based on SCr of 1.12 mg/dL). Liver Function Tests: Recent Labs  Lab 06/22/19 0852  AST 28  ALT 19  ALKPHOS 93  BILITOT 0.8  PROT 7.3  ALBUMIN 3.1*   Recent Labs  Lab 06/23/19 1421  LIPASE 18   No results for input(s): AMMONIA in the last 168 hours. Coagulation Profile: Recent Labs  Lab 06/22/19 1324 06/23/19 0809 06/24/19 0558  INR 1.2 1.3* 1.4*   Cardiac Enzymes: No results for input(s): CKTOTAL, CKMB, CKMBINDEX, TROPONINI in the last 168 hours. BNP (last 3 results) No results for input(s): PROBNP in the last 8760 hours. HbA1C: Recent Labs    06/23/19 0809  HGBA1C 6.5*   CBG: No results for input(s): GLUCAP in the last 168 hours. Lipid Profile: Recent Labs    06/23/19 0809  CHOL 163  HDL 79  LDLCALC 65  TRIG 95  CHOLHDL 2.1   Thyroid Function Tests: Recent Labs    06/23/19 0809  TSH 4.098   Anemia Panel: No results for input(s): VITAMINB12, FOLATE, FERRITIN, TIBC, IRON, RETICCTPCT in the last 72 hours. Sepsis Labs: No results for input(s):  PROCALCITON, LATICACIDVEN in the last 168 hours.  Recent Results (from the past 240 hour(s))  Blood culture (routine x 2)     Status: None (Preliminary result)   Collection Time: 06/22/19  8:52 AM   Specimen: BLOOD  Result Value Ref Range Status   Specimen Description BLOOD LEFT ANTECUBITAL  Final   Special Requests   Final    BOTTLES DRAWN AEROBIC AND ANAEROBIC Blood Culture adequate volume   Culture   Final    NO GROWTH 2 DAYS Performed at [facility], [address], [place],  [address]    Report Status PENDING  Incomplete  Respiratory Panel by RT PCR (Flu A&B, Covid) - Nasopharyngeal Swab     Status: None   Collection Time: 06/22/19  8:52 AM   Specimen: Nasopharyngeal Swab  Result Value Ref Range Status   SARS Coronavirus 2 by RT PCR NEGATIVE NEGATIVE Final    Comment: (NOTE) SARS-CoV-2 target nucleic acids are NOT DETECTED. The SARS-CoV-2 RNA is generally detectable in upper respiratoy specimens during the acute phase of infection. The lowest concentration of SARS-CoV-2 viral copies this assay can detect is 131 copies/mL. A negative result does not preclude SARS-Cov-2 infection and should not be used as the sole basis for treatment or other patient management decisions. A negative result may occur with  improper specimen collection/handling, submission of specimen other than nasopharyngeal swab, presence of viral mutation(s) within the areas targeted by this assay, and inadequate number of viral copies (<131 copies/mL). A negative result must be combined with clinical observations, patient history, and epidemiological information. The expected result is Negative. Fact Sheet for Patients:  PinkCheek.be Fact Sheet for Healthcare Providers:  GravelBags.it This test is not yet ap proved or cleared by the [place] FDA and  has been authorized for detection and/or diagnosis of SARS-CoV-2 by FDA  under an Emergency Use Authorization (EUA). This EUA will remain  in effect (meaning this test can be used) for the duration of the COVID-19 declaration under Section 564(b)(1) of the Act, 21 U.S.C. section 360bbb-3(b)(1), unless the authorization is terminated or revoked sooner.    Influenza A by PCR NEGATIVE NEGATIVE Final   Influenza B by PCR NEGATIVE NEGATIVE Final    Comment: (NOTE) The Xpert Xpress SARS-CoV-2/FLU/RSV assay is intended as an aid in  the diagnosis of influenza from Nasopharyngeal swab specimens and  should not be used as a sole basis for treatment. Nasal washings and  aspirates are unacceptable for Xpert Xpress SARS-CoV-2/FLU/RSV  testing. Fact Sheet for Patients: PinkCheek.be Fact Sheet for Healthcare Providers: GravelBags.it This test is not yet approved or  cleared by the [place] and  has been authorized for detection and/or diagnosis of SARS-CoV-2 by  FDA under an Emergency Use Authorization (EUA). This EUA will remain  in effect (meaning this test can be used) for the duration of the  Covid-19 declaration under Section 564(b)(1) of the Act, 21  U.S.C. section 360bbb-3(b)(1), unless the authorization is  terminated or revoked. Performed at [facility], [address] [place], [place] [address]   Blood culture (routine x 2)     Status: None (Preliminary result)   Collection Time: 06/22/19  9:23 AM   Specimen: BLOOD  Result Value Ref Range Status   Specimen Description BLOOD RIGHT ANTECUBITAL  Final   Special Requests   Final    BOTTLES DRAWN AEROBIC AND ANAEROBIC Blood Culture adequate volume   Culture   Final    NO GROWTH 2 DAYS Performed at [facility], [address] [place], [place] [address]    Report Status PENDING  Incomplete     Radiology Studies: ECHOCARDIOGRAM COMPLETE  Result Date: 06/23/2019    ECHOCARDIOGRAM REPORT   Patient Name:   [name] [name]  Date of Exam: 06/23/2019 Medical Rec #:  [id number]       Height:       68.0 in Accession #:    [id number]      Weight:       408.9 lb Date of Birth:  10-Aug-1960       BSA:          2.770 m Patient Age:    89 years        BP:           119/48 mmHg Patient Gender: M               HR:           61 bpm. Exam Location:  [facility] Procedure: 2D Echo, Cardiac Doppler and Color Doppler Indications:     CHF- acute diastolic 644.03  History:         Patient has prior history of Echocardiogram examinations, most                  recent 03/24/2018. CHF, COPD; Arrythmias:Atrial Fibrillation. MI,                  history of PE.  Sonographer:     Sherrie Sport RDCS (AE) Referring Phys:  [place] Diagnosing Phys: Neoma Laming MD  Sonographer Comments: Technically difficult study due to poor echo windows, no apical window and no subcostal window. IMPRESSIONS  1. Left ventricular ejection fraction, by estimation, is 55 to 60%. The left ventricle has normal function. The left ventricle has no regional wall motion abnormalities. Left ventricular diastolic parameters are consistent with Grade I diastolic dysfunction (impaired relaxation).  2. Right ventricular systolic function is normal. The right ventricular size is normal.  3. The mitral valve is normal in structure. No evidence of mitral valve regurgitation. No evidence of mitral stenosis.  4. The aortic valve is normal in structure. Aortic valve regurgitation is not visualized. No aortic stenosis is present.  5. The inferior vena cava is normal in size with greater than 50% respiratory variability, suggesting right atrial pressure of 3 mmHg. FINDINGS  Left Ventricle: Left ventricular ejection fraction, by estimation, is 55 to 60%. The left ventricle has normal function. The left ventricle has no regional wall motion abnormalities. The left ventricular internal cavity size was normal in  size. There is  no left ventricular hypertrophy. Left ventricular diastolic parameters are consistent  with Grade I diastolic dysfunction (impaired relaxation). Right Ventricle: The right ventricular size is normal. No increase in right ventricular wall thickness. Right ventricular systolic function is normal. Left Atrium: Left atrial size was normal in size. Right Atrium: Right atrial size was normal in size. Pericardium: There is no evidence of pericardial effusion. Mitral Valve: The mitral valve is normal in structure. Normal mobility of the mitral valve leaflets. No evidence of mitral valve regurgitation. No evidence of mitral valve stenosis. Tricuspid Valve: The tricuspid valve is normal in structure. Tricuspid valve regurgitation is not demonstrated. No evidence of tricuspid stenosis. Aortic Valve: The aortic valve is normal in structure. Aortic valve regurgitation is not visualized. No aortic stenosis is present. Pulmonic Valve: The pulmonic valve was normal in structure. Pulmonic valve regurgitation is not visualized. No evidence of pulmonic stenosis. Aorta: The aortic root is normal in size and structure. Venous: The inferior vena cava is normal in size with greater than 50% respiratory variability, suggesting right atrial pressure of 3 mmHg. IAS/Shunts: No atrial level shunt detected by color flow Doppler.  LEFT VENTRICLE PLAX 2D LVIDd:         5.10 cm LVIDs:         3.49 cm LV PW:         1.22 cm LV IVS:        1.31 cm LVOT diam:     2.30 cm LVOT Area:     4.15 cm  LEFT ATRIUM         Index LA diam:    4.90 cm 1.77 cm/m   AORTA Ao Root diam: 3.40 cm  SHUNTS Systemic Diam: 2.30 cm Neoma Laming MD Electronically signed by Neoma Laming MD Signature Date/Time: 06/23/2019/6:12:53 PM    Final     Scheduled Meds: . allopurinol  100 mg Oral Daily  . atorvastatin  40 mg Oral Daily  . DULoxetine  20 mg Oral Daily  . ferrous sulfate  325 mg Oral BID WC  . furosemide  80 mg Intravenous Q12H  . gabapentin  300 mg Oral TID  . ipratropium  0.5 mg Nebulization TID  . levothyroxine  50 mcg Oral QAC breakfast  .  lisinopril  2.5 mg Oral Daily  . metoprolol succinate  50 mg Oral Daily  . predniSONE  50 mg Oral Q breakfast  . warfarin  10 mg Oral ONCE-1600  . Warfarin - Pharmacist Dosing Inpatient   Does not apply q1600   Continuous Infusions: . ceFEPime (MAXIPIME) IV 2 g (06/24/19 1238)  . heparin 2,150 Units/hr (06/24/19 1247)  . linezolid (ZYVOX) IV 600 mg (06/24/19 1026)     LOS: 1 day   Time spent: 40 minutes.    Lorella Nimrod, MD [facility]  If 7PM-7AM, please contact night-coverage [email or web address]  06/24/2019, 1:31 PM   This record has been created using Systems analyst. Errors have been sought and corrected,but may not always be located. Such creation errors do not reflect on the standard of care.

## 2019-06-24 NOTE — Progress Notes (Signed)
MD notified. Pt ask for bedtime snack which needed an order. RN will order a bedtime snack per patient request. I will continue to assess.

## 2019-06-24 NOTE — Progress Notes (Signed)
ANTICOAGULATION CONSULT NOTE - Initial Consult  Pharmacy Consult for Warfarin with Heparin drip bridge Indication :hx pulmonary embolus/DVT and Afib  No Known Allergies  Patient Measurements: Height: 5\' 8"  (172.7 cm) Weight: (!) 195.1 kg (430 lb 3.2 oz) IBW/kg (Calculated) : 68.4 Heparin Dosing Weight:  114.6 kg  Vital Signs: Temp: 97.8 F (36.6 C) (05/09 0442) Temp Source: Oral (05/09 0442) BP: 169/133 (05/09 0442) Pulse Rate: 108 (05/09 0442)  Labs: Recent Labs     0000 06/22/19 0852 06/22/19 0852 06/22/19 1324 06/22/19 1453 06/22/19 1713 06/23/19 0019 06/23/19 0809 06/23/19 1421 06/24/19 0558  HGB   < > 11.7*  --   --   --   --   --  12.4*  --  10.7*  HCT  --  36.3*  --   --   --   --   --  39.0  --  32.9*  PLT  --  255  --   --   --   --   --  252  --  173  APTT  --   --   --  29  --   --   --   --   --   --   LABPROT  --   --   --  14.5  --   --   --  15.2  --  16.5*  INR  --   --   --  1.2  --   --   --  1.3*  --  1.4*  HEPARINUNFRC  --   --   --   --   --   --    < > 0.51 0.35 0.25*  CREATININE  --  0.77  --   --   --   --   --  1.00  --  1.12  TROPONINIHS  --  22*   < > 31* 32* 31*  --   --   --   --    < > = values in this interval not displayed.    Estimated Creatinine Clearance: 121.1 mL/min (by C-G formula based on SCr of 1.12 mg/dL).   Medical History: Past Medical History:  Diagnosis Date  . Anxiety   . Arthritis   . Asthma   . Brain damage   . Chronic pain of both knees 07/13/2018  . Clotting disorder ([place])   . COPD (chronic obstructive pulmonary disease) ([place])   . Depression   . HFrEF (heart failure with reduced ejection fraction) ([place])    a. 03/2018 Echo: EF 25-30%, diff HK. Mod LAE.  [name] History of DVT (deep vein thrombosis)   . History of pulmonary embolism    a. Chronic coumadin.  [name] Hypertension   . MI (myocardial infarction) ([place])   . Morbid obesity ([place])   . Neck pain 07/13/2018  . NICM (nonischemic cardiomyopathy) ([place])    a. s/p  Cath x 3 - reportedly nl cors. Last cath 2019 in [facility]; b. a. 03/2018 Echo: EF 25-30%, diff HK.  [name] Persistent atrial fibrillation ([place])    a. 03/2018 s/p DCCV; b. CHA2DS2VASc = 1-->Xarelto (later changed to warfarin); c. 05/2018 recurrent afib-->Amio initiated.  . Sleep apnea     Medications:  Scheduled:  . allopurinol  100 mg Oral Daily  . atorvastatin  40 mg Oral Daily  . DULoxetine  20 mg Oral Daily  . ferrous sulfate  325 mg Oral BID WC  . furosemide  60 mg Intravenous Q12H  .  gabapentin  300 mg Oral TID  . heparin  1,800 Units Intravenous Once  . ipratropium  0.5 mg Nebulization TID  . levothyroxine  50 mcg Oral QAC breakfast  . lisinopril  2.5 mg Oral Daily  . metoprolol succinate  50 mg Oral Daily  . predniSONE  50 mg Oral Q breakfast  . sodium chloride flush  3 mL Intravenous Q12H  . Warfarin - Pharmacist Dosing Inpatient   Does not apply q1600   Infusions:  . sodium chloride    . ceFEPime (MAXIPIME) IV Stopped (06/23/19 2248)  . heparin 1,950 Units/hr (06/24/19 0128)  . linezolid (ZYVOX) IV 600 mg (06/23/19 2247)    Assessment: 59 yo male on Warfarin PTA for DVT/PE, Afib.  -Patient states that he ran out of his Coumadin, did not take Coumadin for 5 days. Patient states he takes 10 mg (#2 tabs of 5 mg) daily.  Per cards note in Careeverywhere:Mr. Bari does not follow-up consistently for warfarin monitoring,Had PE while on NOAC so switch to warfarin. Hgb 11.7  Plt 255  INR 1.2  aptt 29  DATE INR DOSE 5/7 1.2   Warfarin 10mg   5/8 1.3  Goal of Therapy:  INR 2-3 Heparin level 0.3-0.7 units/ml Monitor platelets by anticoagulation protocol: Yes   Plan:  HEPARIN: 05/09 @ 0600 HL 0.25 subtherapeutic. Per RN drip not stopped or interrupted. Will rebolus w/ heparin 1800 units IV x 1 and increase rate to 2150 units/hr and will recheck HL at 1300, CBC trending down, will continue to monitor.  07/09, PharmD, BCPS Clinical Pharmacist 06/24/2019 7:15 AM

## 2019-06-25 ENCOUNTER — Ambulatory Visit: Payer: Self-pay | Admitting: *Deleted

## 2019-06-25 ENCOUNTER — Encounter: Payer: Self-pay | Admitting: *Deleted

## 2019-06-25 ENCOUNTER — Ambulatory Visit: Payer: Medicaid Other | Admitting: Gastroenterology

## 2019-06-25 ENCOUNTER — Telehealth: Payer: Self-pay

## 2019-06-25 LAB — CBC
HCT: 35.9 % — ABNORMAL LOW (ref 39.0–52.0)
Hemoglobin: 11.6 g/dL — ABNORMAL LOW (ref 13.0–17.0)
MCH: 28.6 pg (ref 26.0–34.0)
MCHC: 32.3 g/dL (ref 30.0–36.0)
MCV: 88.4 fL (ref 80.0–100.0)
Platelets: 231 10*3/uL (ref 150–400)
RBC: 4.06 MIL/uL — ABNORMAL LOW (ref 4.22–5.81)
RDW: 18.6 % — ABNORMAL HIGH (ref 11.5–15.5)
WBC: 10.4 10*3/uL (ref 4.0–10.5)
nRBC: 0.4 % — ABNORMAL HIGH (ref 0.0–0.2)

## 2019-06-25 LAB — BASIC METABOLIC PANEL
Anion gap: 8 (ref 5–15)
BUN: 25 mg/dL — ABNORMAL HIGH (ref 6–20)
CO2: 29 mmol/L (ref 22–32)
Calcium: 8.8 mg/dL — ABNORMAL LOW (ref 8.9–10.3)
Chloride: 99 mmol/L (ref 98–111)
Creatinine, Ser: 1.07 mg/dL (ref 0.61–1.24)
GFR calc Af Amer: >60 mL/min (ref >60)
GFR calc non Af Amer: >60 mL/min (ref >60)
Glucose, Bld: 101 mg/dL — ABNORMAL HIGH (ref 70–99)
Potassium: 4.2 mmol/L (ref 3.5–5.1)
Sodium: 136 mmol/L (ref 135–145)

## 2019-06-25 LAB — MRSA PCR SCREENING: MRSA by PCR: POSITIVE — AB

## 2019-06-25 LAB — PROTIME-INR
INR: 1.9 — ABNORMAL HIGH (ref 0.8–1.2)
Prothrombin Time: 21.1 seconds — ABNORMAL HIGH (ref 11.4–15.2)

## 2019-06-25 MED ORDER — WARFARIN SODIUM 5 MG PO TABS
5.0000 mg | ORAL_TABLET | Freq: Once | ORAL | Status: AC
  Administered 2019-06-25: 17:00:00 5 mg via ORAL
  Filled 2019-06-25 (×2): qty 1

## 2019-06-25 MED ORDER — LINEZOLID 600 MG PO TABS
600.0000 mg | ORAL_TABLET | Freq: Two times a day (BID) | ORAL | Status: DC
  Filled 2019-06-25: qty 1

## 2019-06-25 MED ORDER — WARFARIN SODIUM 3 MG PO TABS
5.0000 mg | ORAL_TABLET | Freq: Once | ORAL | Status: DC
  Filled 2019-06-25: qty 1.5

## 2019-06-25 MED ORDER — WARFARIN SODIUM 4 MG PO TABS
8.0000 mg | ORAL_TABLET | Freq: Once | ORAL | Status: DC
  Filled 2019-06-25: qty 2

## 2019-06-25 MED ORDER — SULFAMETHOXAZOLE-TRIMETHOPRIM 800-160 MG PO TABS
1.0000 | ORAL_TABLET | Freq: Two times a day (BID) | ORAL | Status: DC
  Administered 2019-06-25 – 2019-06-26 (×2): 1 via ORAL
  Filled 2019-06-25 (×4): qty 1

## 2019-06-25 MED ORDER — SODIUM CHLORIDE 0.9 % IV SOLN
INTRAVENOUS | Status: DC | PRN
  Administered 2019-06-25: 11:00:00 250 mL via INTRAVENOUS

## 2019-06-25 NOTE — Chronic Care Management (AMB) (Signed)
Care Management   Social Work Note  06/25/2019 Name: [name] [name] MRN: [id number] DOB: 01-21-1961  [name] [name] is a 59 y.o. year old male who sees [name], [name] Aline, FNP for primary care. The CCM team was consulted for assistance with [name] .   Phone call to patient, however patient is hospitalized with  acute on chronic CHF. Per patient, he is not sure about discharge plan at this time. Patient informed that his provider has completed personal care service request form and has submitted it.  SDOH (Social Determinants of Health) assessments performed: No     Facility-Administered Encounter Medications as of 06/25/2019  Medication  . 0.9 %  sodium chloride infusion  . acetaminophen (TYLENOL) tablet 650 mg  . albuterol (PROVENTIL) (2.5 MG/3ML) 0.083% nebulizer solution 2.5 mg  . allopurinol (ZYLOPRIM) tablet 100 mg  . ALPRAZolam (XANAX) tablet 0.5 mg  . atorvastatin (LIPITOR) tablet 40 mg  . colchicine tablet 0.6 mg  . dextromethorphan-guaiFENesin (MUCINEX DM) 30-600 MG per 12 hr tablet 1 tablet  . DULoxetine (CYMBALTA) DR capsule 20 mg  . enoxaparin (LOVENOX) injection 150 mg  . ferrous sulfate tablet 325 mg  . furosemide (LASIX) injection 80 mg  . gabapentin (NEURONTIN) capsule 300 mg  . hydrALAZINE (APRESOLINE) injection 5 mg  . HYDROcodone-acetaminophen (NORCO/VICODIN) 5-325 MG per tablet 1-2 tablet  . hydrOXYzine (VISTARIL) injection 25 mg  . ipratropium (ATROVENT) nebulizer solution 0.5 mg  . levothyroxine (SYNTHROID) tablet 50 mcg  . lisinopril (ZESTRIL) tablet 2.5 mg  . loperamide (IMODIUM) capsule 2 mg  . metoprolol succinate (TOPROL-XL) 24 hr tablet 50 mg  . morphine 2 MG/ML injection 2 mg  . nitroGLYCERIN (NITROSTAT) SL tablet 0.4 mg  . predniSONE (DELTASONE) tablet 50 mg  . sulfamethoxazole-trimethoprim (BACTRIM DS) 800-160 MG per tablet 1 tablet  . Warfarin - Pharmacist Dosing Inpatient   Outpatient Encounter Medications as of 06/25/2019    Medication Sig  . albuterol (VENTOLIN HFA) 108 (90 Base) MCG/ACT inhaler Inhale 2 puffs into the lungs every 6 (six) hours as needed for wheezing or shortness of breath.  . allopurinol (ZYLOPRIM) 100 MG tablet Take 1 tablet (100 mg total) by mouth daily.  [name] atorvastatin (LIPITOR) 40 MG tablet TAKE 1 TABLET BY MOUTH ONCE DAILY  . colchicine 0.6 MG tablet 1 tab PO q 12 hrs as needed until gout flare subsides, schedule on office follow up (Patient taking differently: Take 0.6 mg by mouth daily as needed. )  . DULoxetine (CYMBALTA) 20 MG capsule Take 1 capsule (20 mg total) by mouth daily.  . ferrous sulfate 325 (65 FE) MG tablet Take 1 tablet (325 mg total) by mouth 2 (two) times daily with a meal.  . gabapentin (NEURONTIN) 300 MG capsule Take 1 capsule (300 mg total) by mouth 3 (three) times daily.  [name] levothyroxine (SYNTHROID) 50 MCG tablet Take 1 tablet (50 mcg total) by mouth daily before breakfast. Need lab work beginning of June for Thyroid  . lisinopril (ZESTRIL) 2.5 MG tablet Take 1 tablet (2.5 mg total) by mouth daily.  . metoprolol succinate (TOPROL-XL) 50 MG 24 hr tablet Take 1 tablet (50 mg total) by mouth daily. Take with or immediately following a meal.  . nitroGLYCERIN (NITROSTAT) 0.4 MG SL tablet Place 1 tablet under tongue every 5 minutes as needed for chest pain. (No more than 3 doses within 15 minutes)  . torsemide (DEMADEX) 20 MG tablet Take 3 tablets (60 mg total) by mouth 2 (two) times daily.  [name]  traZODone (DESYREL) 150 MG tablet TAKE 1 TABLET BY MOUTH AT BEDTIME AS NEEDED SLEEP  . warfarin (COUMADIN) 5 MG tablet Take by mouth daily as directed by the anti-coag clinic (Patient not taking: Reported on 06/22/2019)    Goals Addressed   None     Follow Up Plan: SW will follow up with patient by phone over the next 2 weeks to follow up on personal care service request   [name] Czech, LCSW Clinical Social Worker  [facility] Care  Management [phone]

## 2019-06-25 NOTE — Progress Notes (Signed)
Pt transferred to 229. Report given to [employer].

## 2019-06-25 NOTE — Progress Notes (Addendum)
ANTICOAGULATION CONSULT NOTE - Initial Consult  Pharmacy Consult for Warfarin with enoxaparin bridge Indication :hx pulmonary embolus/DVT and Afib  No Known Allergies  Patient Measurements: Height: 5\' 8"  (172.7 cm) Weight: (!) 196.9 kg (434 lb) IBW/kg (Calculated) : 68.4 Heparin Dosing Weight:  114.6 kg  Vital Signs: Temp: 98.5 F (36.9 C) (05/10 0738) Temp Source: Oral (05/10 0738) BP: 104/74 (05/10 0738) Pulse Rate: 104 (05/10 0738)  Labs: Recent Labs     0000 06/22/19 1324 06/22/19 1453 06/22/19 1713 06/23/19 0019 06/23/19 0809 06/23/19 0809 06/23/19 1421 06/24/19 0558 06/25/19 0441  HGB   < >  --   --   --   --  12.4*   < >  --  10.7* 11.6*  HCT   < >  --   --   --   --  39.0  --   --  32.9* 35.9*  PLT   < >  --   --   --   --  252  --   --  173 231  APTT  --  29  --   --   --   --   --   --   --   --   LABPROT   < > 14.5  --   --   --  15.2  --   --  16.5* 21.1*  INR   < > 1.2  --   --   --  1.3*  --   --  1.4* 1.9*  HEPARINUNFRC  --   --   --   --    < > 0.51  --  0.35 0.25*  --   CREATININE   < >  --   --   --   --  1.00  --   --  1.12 1.07  TROPONINIHS  --  31* 32* 31*  --   --   --   --   --   --    < > = values in this interval not displayed.    Estimated Creatinine Clearance: 127.5 mL/min (by C-G formula based on SCr of 1.07 mg/dL).   Medical History: Past Medical History:  Diagnosis Date  . Anxiety   . Arthritis   . Asthma   . Brain damage   . Chronic pain of both knees 07/13/2018  . Clotting disorder (HCC)   . COPD (chronic obstructive pulmonary disease) (HCC)   . Depression   . HFrEF (heart failure with reduced ejection fraction) (HCC)    a. 03/2018 Echo: EF 25-30%, diff HK. Mod LAE.  04/2018 History of DVT (deep vein thrombosis)   . History of pulmonary embolism    a. Chronic coumadin.  [name] Hypertension   . MI (myocardial infarction) (HCC)   . Morbid obesity (HCC)   . Neck pain 07/13/2018  . NICM (nonischemic cardiomyopathy) (HCC)    a. s/p  Cath x 3 - reportedly nl cors. Last cath 2019 in [place]; b. a. 03/2018 Echo: EF 25-30%, diff HK.  04/2018 Persistent atrial fibrillation (HCC)    a. 03/2018 s/p DCCV; b. CHA2DS2VASc = 1-->Xarelto (later changed to warfarin); c. 05/2018 recurrent afib-->Amio initiated.  . Sleep apnea     Medications:  Scheduled:  . allopurinol  100 mg Oral Daily  . atorvastatin  40 mg Oral Daily  . DULoxetine  20 mg Oral Daily  . enoxaparin (LOVENOX) injection  150 mg Subcutaneous Q12H  . ferrous sulfate  325 mg Oral BID  WC  . furosemide  80 mg Intravenous Q12H  . gabapentin  300 mg Oral TID  . ipratropium  0.5 mg Nebulization TID  . levothyroxine  50 mcg Oral QAC breakfast  . linezolid  600 mg Oral Q12H  . lisinopril  2.5 mg Oral Daily  . metoprolol succinate  50 mg Oral Daily  . predniSONE  50 mg Oral Q breakfast  . Warfarin - Pharmacist Dosing Inpatient   Does not apply q1600   Infusions:  . ceFEPime (MAXIPIME) IV Stopped (06/24/19 2251)  . linezolid (ZYVOX) IV Stopped (06/24/19 2320)    Assessment: 59 yo male on Warfarin PTA for DVT/PE, Afib.  -Patient states that he ran out of his Coumadin, did not take Coumadin for 5 days. Patient states he takes 10 mg (#2 tabs of 5 mg) daily.  Per cards note in Careeverywhere:Mr. Porche does not follow-up consistently for warfarin monitoring,Had PE while on NOAC so switch to warfarin. Pt is currently on cefepime and linezolid.    DATE INR DOSE 5/7 1.2       10mg   5/8 1.3         8mg   5/9       1.4       10mg  5/10     1.9         5mg   Goal of Therapy:  INR 2-3 Heparin level 0.3-0.7 units/ml Monitor platelets by anticoagulation protocol: Yes   Plan:  Enoxaparin:  Lab was unable to obtain heparin level yesterday. Switched heparin infusion to enoxaparin 150 mg BID (max dose). Will plan to obtain an anti-xa level tomorrow 4 hours post dose.   WARFARIN:  INR slightly subtherapeutic. Will order warfarin 5 mg X 1 dose  tonight. F/U INR with AM labs. CBC daily.  Will need to be on enoxaparin bridge for at least 5 days and until INR > 2 for 24 hours.  Starting bactrim which can increase INR.   Discharge recommendation: Warfarin 3 mg daily due to DDI with bactrim plan a clinic follow up in  2-3 days from discharge.  Oswald Hillock, PharmD, BCPS Clinical Pharmacist 06/25/2019 7:44 AM

## 2019-06-25 NOTE — Care Management (Signed)
TOC assessment complete.  Full note to follow  

## 2019-06-25 NOTE — Progress Notes (Signed)
PROGRESS NOTE    [name] [name]  NKN:[id number] DOB: 18-Jul-1960 DOA: 06/22/2019 PCP: Doreen Beam, FNP   Brief Narrative:  [name] [name] is a 59 y.o. male with medical history significant of sCHF with EF of 25-30%, left bundle blockade, hypertension, hyperlipidemia, COPD, asthma, hypothyroidism, gout, depression, atrial fibrillation, PE/DVT on Coumadin, OSA on CPAP, CAD, iron deficiency anemia, who presents with shortness breath and some nausea.  He also has bilateral lower extremity cellulitis, left worse than right with multiple wounds.  Recently treated with outpatient antibiotic with no response.  Chest x-ray with cardiomegaly, vascular congestion and interstitial pulmonary edema.  Admitted for acute on chronic systolic heart failure..  Subjective: Patient has no new complaints today.  He does not want to go to rehab and would like to go back home with home health services.  Assessment & Plan:   Principal Problem:   Acute on chronic systolic CHF (congestive heart failure) (HCC) Active Problems:   COPD (chronic obstructive pulmonary disease) (HCC)   HTN (hypertension)   Personal history of DVT (deep vein thrombosis)   Atrial fibrillation with RVR (HCC)   Chest pain   PE (pulmonary thromboembolism) (HCC)   HLD (hyperlipidemia)   CAD (coronary artery disease)   Lower extremity cellulitis   Hypothyroidism   Iron deficiency anemia   Depression   Elevated troponin   Cellulitis due to methicillin-resistant Staphylococcus aureus (MRSA)  Acute on chronic systolic CHF (congestive heart failure) [facility]): Patient has 3+ leg edema, elevated BNP, chest x-ray showed cardiomegaly, vascular congestion and interstitial edema, clinically consistent with CHF exacerbation.  2D echo on 03/24/2018 showed EF 25-30%. -Repeat echocardiogram -with normal EF and grade 1 diastolic dysfunction. -Weight increased despite being on Lasix.  Continue to have significant lower extremity edema.  Urine output  close to 4 L.  Patient snacks throughout the day and night and get upset when not provided. -Continue IV Lasix to 80 mg twice daily. -Continue with daily weights and strict intake and output. -Daily BMP  Lower extremity cellulitis.  Patient follow-up at a wound care center and was told there that he might need IV antibiotics.  Reviewed notes from wound care clinic.  Wound culture done on 06/01/2019 was growing MRSA and Enterobacter cloacae, he was treated first with doxycycline for 2 weeks and then with Bactrim with no response.  CRP elevated at 5.5 and ESR of 61.  No other systemic sign of infection.  Remained afebrile with no leukocytosis.  Blood cultures  Negative. Extensive bilateral lower extremity wounds, left worse than right. Wound care rewrapped him yesterday.  Noticed purulent discharge. -After discussing with pharmacy he was started on linezolid as he will not be a good candidate for vancomycin due to his weight requiring more monitoring. -Switch ceftriaxone with cefepime to cover Enterobacter. -He can go home on Bactrim for 10 more days and will follow up with wound care clinic. -Continue current antibiotics. -Continue wound care. -Diuresis should also help. -PT/OT evaluation.  COPD (chronic obstructive pulmonary disease) ([place]):  And has mild wheezing on admission which has been resolved. -Continue prednisone 50 mg daily for another  day. -Continue bronchodilators.  Elevated troponin/chest/LUQ Pain.  Opening elevated at 31 with flat curve most likely secondary to demand.  He was also having nonspecific left-sided lower chest pain, mostly involving left upper quadrant. -We will check lipase-normal. -Pain resolved today.  History of PE and DVT.  Patient was on Coumadin at home, he ran out of his meds for the past 5  days.  INR subtherapeutic on admission. -Restart Coumadin with heparin bridge, switch to Lovenox as unable to obtain heparin levels as patient is hard stick. -Patient  will need close monitoring of INR at home as being given Bactrim which can also increase INR with Coumadin.  Atrial fibrillation with RVR ([place]): Was given 10 mg of Cardizem in ED. Currently rate controlled. -Continue metoprolol and Coumadin. -TSH was within normal limit.  Hypertension.  Blood pressure within goal. -Continue home dose of lisinopril and metoprolol.  Hypothyroidism.  TSH within normal limit. -Continue Synthroid  HLD (hyperlipidemia) -Continue lipitor  Iron deficiency anemia: Hemoglobin 11.7 -Continue iron supplement -Follow-up with CBC  Depression: No SI or HI. -continue Cymbalta  Gout: -continue home allopurinol and Colchicine.  Morbid obesity. Body mass index is 65.99 kg/m.  -Will complicate overall prognosis.  Objective: Vitals:   06/25/19 0738 06/25/19 0758 06/25/19 1100 06/25/19 1228  BP: 104/74  (!) 114/31 118/75  Pulse: (!) 104  83 94  Resp: 19  18   Temp: 98.5 F (36.9 C)  98.1 F (36.7 C)   TempSrc: Oral  Oral   SpO2: 98% 96% 96%   Weight:      Height:        Intake/Output Summary (Last 24 hours) at 06/25/2019 1328 Last data filed at 06/25/2019 1219 Gross per 24 hour  Intake 2070.27 ml  Output 3800 ml  Net -1729.73 ml   Filed Weights   06/23/19 0501 06/24/19 0442 06/25/19 0436  Weight: (!) 185.5 kg (!) 195.1 kg (!) 196.9 kg    Examination:  General exam: Morbidly obese gentleman ,appears calm and comfortable  Respiratory system: Clear to auscultation. Respiratory effort normal. Cardiovascular system: S1 & S2 heard, RRR. No JVD, murmurs, rubs, gallops or clicks. Gastrointestinal system: Soft, nontender, nondistended, bowel sounds positive. Central nervous system: Alert and oriented. No focal neurological deficits. Extremities: 3+ LE edema, bilateral unna boots in place.  Psychiatry: Judgement and insight appear normal.    DVT prophylaxis: Coumadin Code Status: Full Family Communication: Discussed with patient Disposition  Plan:  Status is: Inpatient  Remains inpatient appropriate because:IV treatments appropriate due to intensity of illness or inability to take PO   Dispo: The patient is from: Home              Anticipated d/c is to: To be determined              Anticipated d/c date is: 1 day.              Patient currently is not medically stable to d/c.  Patient does not want to go to SNF and would like to go back home with maximum home health services which were ordered in [facility] is trying to get it.  Consultants:   None  Procedures:  Antimicrobials:  Zyvox Cefepime  Data Reviewed: I have personally reviewed following labs and imaging studies  CBC: Recent Labs  Lab 06/22/19 0852 06/23/19 0809 06/24/19 0558 06/25/19 0441  WBC 8.8 6.7 5.8 10.4  NEUTROABS 6.9  --   --   --   HGB 11.7* 12.4* 10.7* 11.6*  HCT 36.3* 39.0 32.9* 35.9*  MCV 87.9 88.2 87.5 88.4  PLT 255 252 173 604   Basic Metabolic Panel: Recent Labs  Lab 06/22/19 0852 06/23/19 0809 06/24/19 0558 06/25/19 0441  NA 139 135 136 136  K 3.9 3.9 3.8 4.2  CL 103 96* 100 99  CO2 24 26 26 29   GLUCOSE 142* 148* 205*  101*  BUN 6 11 20  25*  CREATININE 0.77 1.00 1.12 1.07  CALCIUM 7.8* 8.4* 8.5* 8.8*  MG  --  1.7  --   --    GFR: Estimated Creatinine Clearance: 127.5 mL/min (by C-G formula based on SCr of 1.07 mg/dL). Liver Function Tests: Recent Labs  Lab 06/22/19 0852  AST 28  ALT 19  ALKPHOS 93  BILITOT 0.8  PROT 7.3  ALBUMIN 3.1*   Recent Labs  Lab 06/23/19 1421  LIPASE 18   No results for input(s): AMMONIA in the last 168 hours. Coagulation Profile: Recent Labs  Lab 06/22/19 1324 06/23/19 0809 06/24/19 0558 06/25/19 0441  INR 1.2 1.3* 1.4* 1.9*   Cardiac Enzymes: No results for input(s): CKTOTAL, CKMB, CKMBINDEX, TROPONINI in the last 168 hours. BNP (last 3 results) No results for input(s): PROBNP in the last 8760 hours. HbA1C: Recent Labs    06/23/19 0809  HGBA1C 6.5*   CBG: No results for  input(s): GLUCAP in the last 168 hours. Lipid Profile: Recent Labs    06/23/19 0809  CHOL 163  HDL 79  LDLCALC 65  TRIG 95  CHOLHDL 2.1   Thyroid Function Tests: Recent Labs    06/23/19 0809  TSH 4.098   Anemia Panel: No results for input(s): VITAMINB12, FOLATE, FERRITIN, TIBC, IRON, RETICCTPCT in the last 72 hours. Sepsis Labs: No results for input(s): PROCALCITON, LATICACIDVEN in the last 168 hours.  Recent Results (from the past 240 hour(s))  Blood culture (routine x 2)     Status: None (Preliminary result)   Collection Time: 06/22/19  8:52 AM   Specimen: BLOOD  Result Value Ref Range Status   Specimen Description BLOOD LEFT ANTECUBITAL  Final   Special Requests   Final    BOTTLES DRAWN AEROBIC AND ANAEROBIC Blood Culture adequate volume   Culture   Final    NO GROWTH 3 DAYS Performed at [facility], [address], [place], [place] [address]    Report Status PENDING  Incomplete  Respiratory Panel by RT PCR (Flu A&B, Covid) - Nasopharyngeal Swab     Status: None   Collection Time: 06/22/19  8:52 AM   Specimen: Nasopharyngeal Swab  Result Value Ref Range Status   SARS Coronavirus 2 by RT PCR NEGATIVE NEGATIVE Final    Comment: (NOTE) SARS-CoV-2 target nucleic acids are NOT DETECTED. The SARS-CoV-2 RNA is generally detectable in upper respiratoy specimens during the acute phase of infection. The lowest concentration of SARS-CoV-2 viral copies this assay can detect is 131 copies/mL. A negative result does not preclude SARS-Cov-2 infection and should not be used as the sole basis for treatment or other patient management decisions. A negative result may occur with  improper specimen collection/handling, submission of specimen other than nasopharyngeal swab, presence of viral mutation(s) within the areas targeted by this assay, and inadequate number of viral copies (<131 copies/mL). A negative result must be combined with clinical observations, patient  history, and epidemiological information. The expected result is Negative. Fact Sheet for Patients:  PinkCheek.be Fact Sheet for Healthcare Providers:  GravelBags.it This test is not yet ap proved or cleared by the [place] FDA and  has been authorized for detection and/or diagnosis of SARS-CoV-2 by FDA under an Emergency Use Authorization (EUA). This EUA will remain  in effect (meaning this test can be used) for the duration of the COVID-19 declaration under Section 564(b)(1) of the Act, 21 U.S.C. section 360bbb-3(b)(1), unless the authorization is terminated or revoked sooner.  Influenza A by PCR NEGATIVE NEGATIVE Final   Influenza B by PCR NEGATIVE NEGATIVE Final    Comment: (NOTE) The Xpert Xpress SARS-CoV-2/FLU/RSV assay is intended as an aid in  the diagnosis of influenza from Nasopharyngeal swab specimens and  should not be used as a sole basis for treatment. Nasal washings and  aspirates are unacceptable for Xpert Xpress SARS-CoV-2/FLU/RSV  testing. Fact Sheet for Patients: PinkCheek.be Fact Sheet for Healthcare Providers: GravelBags.it This test is not yet approved or cleared by the [place] FDA and  has been authorized for detection and/or diagnosis of SARS-CoV-2 by  FDA under an Emergency Use Authorization (EUA). This EUA will remain  in effect (meaning this test can be used) for the duration of the  Covid-19 declaration under Section 564(b)(1) of the Act, 21  U.S.C. section 360bbb-3(b)(1), unless the authorization is  terminated or revoked. Performed at [facility], [address] [place], [place] [address]   Blood culture (routine x 2)     Status: None (Preliminary result)   Collection Time: 06/22/19  9:23 AM   Specimen: BLOOD  Result Value Ref Range Status   Specimen Description BLOOD RIGHT ANTECUBITAL  Final   Special  Requests   Final    BOTTLES DRAWN AEROBIC AND ANAEROBIC Blood Culture adequate volume   Culture   Final    NO GROWTH 3 DAYS Performed at [facility], [address], [place],  [address]    Report Status PENDING  Incomplete     Radiology Studies: No results found.  Scheduled Meds: . allopurinol  100 mg Oral Daily  . atorvastatin  40 mg Oral Daily  . DULoxetine  20 mg Oral Daily  . enoxaparin (LOVENOX) injection  150 mg Subcutaneous Q12H  . ferrous sulfate  325 mg Oral BID WC  . furosemide  80 mg Intravenous Q12H  . gabapentin  300 mg Oral TID  . ipratropium  0.5 mg Nebulization TID  . levothyroxine  50 mcg Oral QAC breakfast  . lisinopril  2.5 mg Oral Daily  . metoprolol succinate  50 mg Oral Daily  . predniSONE  50 mg Oral Q breakfast  . sulfamethoxazole-trimethoprim  1 tablet Oral Q12H  . warfarin  5 mg Oral ONCE-1600  . Warfarin - Pharmacist Dosing Inpatient   Does not apply q1600   Continuous Infusions: . sodium chloride Stopped (06/25/19 1207)     LOS: 2 days   Time spent: 40 minutes.    Lorella Nimrod, MD [facility]  If 7PM-7AM, please contact night-coverage [email or web address]  06/25/2019, 1:28 PM   This record has been created using Systems analyst. Errors have been sought and corrected,but may not always be located. Such creation errors do not reflect on the standard of care.

## 2019-06-25 NOTE — Evaluation (Signed)
Occupational Therapy Evaluation Patient Details Name: [name] [name] MRN: [id number] DOB: [birth year] Today's Date: 06/25/2019    History of Present Illness Pt is 59 y/o M with PMH: sCHF with EF of 25-30%, left bundle blockade, hypertension, hyperlipidemia, COPD, asthma, hypothyroidism, gout, depression, atrial fibrillation, PE/DVT on Coumadin, OSA on CPAP, CAD, iron deficiency anemia, who presents with shortness breath and some nausea. Chest x-ray with cardiomegaly, vascular congestion and interstitial pulmonary edema. Pt adm for acute on chronic sCHF.   Clinical Impression   Pt seen for OT evaluation this date in setting of decreased fxl activity tolerance 2/2 hospitalization with acute on chronic sCHF. Pt reports living at home alone, but having modifications to complete tasks safely. Pt endorses using rollator for fxl mobility, uses scooter for longer distances like at the grocery. Pt reports he sits to bathe and uses LH sponge and removable shower head as he has difficulty reaching his peri area. In addition, for toileting, pt has bidet for peri care. Pt noted to require MOD/MAX A with these tasks on assessment using sit to/from stand technique with Bariatric front wheel walker. However, given the reported home equipment pt endorses using, anticipate pt could perform all these ADLs at his baseline of MOD I. Will continue to follow pt while in acute setting to improve fxl activity tolerance as it pertains to safely completing standing ADLs, but do not anticipate need for OT f/u upon d/c from acute setting.     Follow Up Recommendations  No OT follow up    Equipment Recommendations  None recommended by OT(pt reports having all necessary equipment.)    Recommendations for Other Services       Precautions / Restrictions Precautions Precautions: Fall Restrictions Weight Bearing Restrictions: No Other Position/Activity Restrictions: B LE bandages and wraps, largest non-skid sock does not fit  pt.      Mobility Bed Mobility               General bed mobility comments: pt up to chair when OT presents  Transfers Overall transfer level: Needs assistance Equipment used: Rolling walker (2 wheeled) Transfers: Sit to/from Stand Sit to Stand: Supervision;Min guard         General transfer comment: with [place]kin Nearing RW. Pt references several times that he would prefer to use Rollator as this is what he uses at home.    Balance Overall balance assessment: Modified Independent;Mild deficits observed, not formally tested                                         ADL either performed or assessed with clinical judgement   ADL Overall ADL's : Needs assistance/impaired Eating/Feeding: Independent;Sitting   Grooming: Wash/dry hands;Independent;Sitting   Upper Body Bathing: Set up;Sitting   Lower Body Bathing: Moderate assistance;Maximal assistance;Sit to/from stand Lower Body Bathing Details (indicate cue type and reason): Pt able to stand with CGA to SBA, but difficulty reaching to complete peri bathing/LEs d/t him pain/abd circumference req'ing MOD/MAX A to complete States he modifies this task at home (see "home living" section) Upper Body Dressing : Set up;Sitting   Lower Body Dressing: Moderate assistance;Sitting/lateral leans   Toilet Transfer: Min guard;RW;BSC   Toileting- Clothing Manipulation and Hygiene: Moderate assistance;Maximal assistance;Sit to/from stand Toileting - Clothing Manipulation Details (indicate cue type and reason): able to stand for peri care with CGA to SBA, but requires MOD/MAX A for peri care d/t  limited ability to reacg d/t limited ROM 2/2 hip pain.             Vision Patient Visual Report: No change from baseline       Perception     Praxis      Pertinent Vitals/Pain Pain Assessment: Faces Faces Pain Scale: Hurts little more Pain Location: hips Pain Descriptors / Indicators: Aching;Grimacing Pain Intervention(s):  Limited activity within patient's tolerance;Monitored during session     Hand Dominance     Extremity/Trunk Assessment Upper Extremity Assessment Upper Extremity Assessment: Overall WFL for tasks assessed   Lower Extremity Assessment Lower Extremity Assessment: Defer to PT evaluation;Overall [facility] for tasks assessed;Generalized weakness       Communication Communication Communication: No difficulties   Cognition Arousal/Alertness: Awake/alert Behavior During Therapy: WFL for tasks assessed/performed Overall Cognitive Status: Within Functional Limits for tasks assessed                                 General Comments: somewhat impulsive, but A&O, primarily pleasant to work with. Somewhat self limiting.   General Comments  Pt stands with wide BOS with feet slightly externally rotated.    Exercises Other Exercises Other Exercises: OT facilitates education re: role of OT in acute setting. Pt with moderate reception of education, but still primarily sits and waits for assistance with bathing (despite OT calrifying that I am attempting to assess what he can do for himself prior to offering assistance).   Shoulder Instructions      Home Living Family/patient expects to be discharged to:: Private residence Living Arrangements: Alone                 Bathroom Shower/Tub: Walk-in shower         Home Equipment: Environmental consultant - 4 wheels;Electric scooter;Shower seat;Hand held shower head   Additional Comments: uses 4WW primarily for short distances, endorses scooter use for longer distances.      Prior Functioning/Environment Level of Independence: Independent with assistive device(s)        Comments: Pt reports losing wife ~a year and a half ago. States he has no family in the area, on sister in [place]. States he modifies home self care d/t hip pain and "difficulty reaching": uses bidet for peri care, sits on shower chair and uses removable showerhead/LH sponge to  bathe. States he has a [employer] come 2x/wk to change his bandages d/t B LE wounds.        OT Problem List: Decreased range of motion;Decreased activity tolerance;Impaired balance (sitting and/or standing);Obesity;Pain      OT Treatment/Interventions: Self-care/ADL training;Therapeutic exercise;Therapeutic activities;Patient/family education;Balance training    OT Goals(Current goals can be found in the care plan section) Acute Rehab OT Goals Patient Stated Goal: to go home OT Goal Formulation: With patient Time For Goal Achievement: 07/09/19 Potential to Achieve Goals: Good  OT Frequency: Min 1X/week   Barriers to D/C: Decreased caregiver support          Co-evaluation              AM-PAC OT "6 Clicks" Daily Activity     Outcome Measure Help from another person eating meals?: None Help from another person taking care of personal grooming?: None Help from another person toileting, which includes using toliet, bedpan, or urinal?: A Lot Help from another person bathing (including washing, rinsing, drying)?: A Lot Help from another person to put on and taking off  regular upper body clothing?: None Help from another person to put on and taking off regular lower body clothing?: A Lot 6 Click Score: 18   End of Session Equipment Utilized During Treatment: Gait belt;Rolling walker Nurse Communication: Mobility status  Activity Tolerance: Patient tolerated treatment well;Patient limited by pain Patient left: in chair;with call bell/phone within reach  OT Visit Diagnosis: Muscle weakness (generalized) (M62.81);Pain Pain - part of body: Hip                Time: 0272-5366 OT Time Calculation (min): 42 min Charges:  OT General Charges $OT Visit: 1 Visit OT Evaluation $OT Eval Moderate Complexity: 1 Mod OT Treatments $Self Care/Home Management : 8-22 mins $Therapeutic Activity: 8-22 mins  Rejeana Brock, MS, OTR/L ascom [phone] 06/25/19, 3:02 PM

## 2019-06-25 NOTE — Progress Notes (Signed)
PT Cancellation Note  Patient Details Name: [name] [name] MRN: [id number] DOB: February 26, 1960   Cancelled Treatment:    Reason Eval/Treat Not Completed: Patient declined, no reason specified(Evaluation re-attempted. Patient up in chair upon arrival to room; declines participation with treatment session. Reports baseline R hip/nerve pain, unable to tolerate activity beyond basic transfers/mobility per his report. Unable to redirect despite encouragement.)  Of note, reports living in single-level, handicap accessible apartment; ambulatory with 4WRW for household distances at baseline, uses power WC/scooter for longer, community distances.. No home O2. Multiple fall history (and able to verbalize strategy for self-recovery).   Elaijah Munoz H. Manson Passey, PT, DPT, NCS 06/25/19, 11:35 AM [phone]

## 2019-06-25 NOTE — Telephone Encounter (Signed)
Pt currently admitted to [facility].

## 2019-06-26 ENCOUNTER — Ambulatory Visit: Payer: Self-pay | Admitting: *Deleted

## 2019-06-26 DIAGNOSIS — G894 Chronic pain syndrome: Secondary | ICD-10-CM

## 2019-06-26 DIAGNOSIS — J449 Chronic obstructive pulmonary disease, unspecified: Secondary | ICD-10-CM

## 2019-06-26 DIAGNOSIS — L8990 Pressure ulcer of unspecified site, unspecified stage: Secondary | ICD-10-CM | POA: Insufficient documentation

## 2019-06-26 LAB — BASIC METABOLIC PANEL
Anion gap: 9 (ref 5–15)
BUN: 27 mg/dL — ABNORMAL HIGH (ref 6–20)
CO2: 31 mmol/L (ref 22–32)
Calcium: 8.8 mg/dL — ABNORMAL LOW (ref 8.9–10.3)
Chloride: 95 mmol/L — ABNORMAL LOW (ref 98–111)
Creatinine, Ser: 1.11 mg/dL (ref 0.61–1.24)
GFR calc Af Amer: >60 mL/min (ref >60)
GFR calc non Af Amer: >60 mL/min (ref >60)
Glucose, Bld: 116 mg/dL — ABNORMAL HIGH (ref 70–99)
Potassium: 4.2 mmol/L (ref 3.5–5.1)
Sodium: 135 mmol/L (ref 135–145)

## 2019-06-26 LAB — PROTIME-INR
INR: 2.1 — ABNORMAL HIGH (ref 0.8–1.2)
Prothrombin Time: 22.9 seconds — ABNORMAL HIGH (ref 11.4–15.2)

## 2019-06-26 MED ORDER — ALPRAZOLAM 0.5 MG PO TABS: 0.5000 mg | ORAL_TABLET | Freq: Two times a day (BID) | ORAL | 0 refills | Status: DC | PRN

## 2019-06-26 MED ORDER — LOPERAMIDE HCL 2 MG PO CAPS: 2.0000 mg | ORAL_CAPSULE | ORAL | 0 refills | Status: DC | PRN

## 2019-06-26 MED ORDER — SULFAMETHOXAZOLE-TRIMETHOPRIM 800-160 MG PO TABS: 1.0000 | ORAL_TABLET | Freq: Two times a day (BID) | ORAL | 0 refills | Status: AC

## 2019-06-26 MED ORDER — TORSEMIDE 20 MG PO TABS: 80.0000 mg | ORAL_TABLET | Freq: Two times a day (BID) | ORAL | 1 refills | Status: DC

## 2019-06-26 MED ORDER — HYDROCODONE-ACETAMINOPHEN 5-325 MG PO TABS: 1.0000 | ORAL_TABLET | Freq: Four times a day (QID) | ORAL | 0 refills | Status: DC | PRN

## 2019-06-26 MED ORDER — WARFARIN SODIUM 5 MG PO TABS
5.0000 mg | ORAL_TABLET | Freq: Once | ORAL | Status: DC
  Filled 2019-06-26: qty 1

## 2019-06-26 MED ORDER — WARFARIN SODIUM 5 MG PO TABS
5.0000 mg | ORAL_TABLET | Freq: Every day | ORAL | 11 refills | Status: DC
Start: 2019-06-26 — End: 2019-08-14

## 2019-06-26 NOTE — Chronic Care Management (AMB) (Signed)
Chronic Care Management    Clinical Social Work Follow Up Note  06/26/2019 Name: [name] [name] MRN: [id number] DOB: [birth year]  [name] [name] is a 59 y.o. year old male who is a primary care patient of Flinchum, Kelby Aline, FNP. The CCM team was consulted for assistance with [name] .   Review of patient status, including review of consultants reports, other relevant assessments, and collaboration with appropriate care team members and the patient's provider was performed as part of comprehensive patient evaluation and provision of chronic care management services.    SDOH (Social Determinants of Health) assessments performed: No    Outpatient Encounter Medications as of 06/26/2019  Medication Sig  . albuterol (VENTOLIN HFA) 108 (90 Base) MCG/ACT inhaler Inhale 2 puffs into the lungs every 6 (six) hours as needed for wheezing or shortness of breath.  . allopurinol (ZYLOPRIM) 100 MG tablet Take 1 tablet (100 mg total) by mouth daily.  [name] ALPRAZolam (XANAX) 0.5 MG tablet Take 1 tablet (0.5 mg total) by mouth 2 (two) times daily as needed for anxiety or sleep.  . colchicine 0.6 MG tablet 1 tab PO q 12 hrs as needed until gout flare subsides, schedule on office follow up (Patient taking differently: Take 0.6 mg by mouth daily as needed. )  . DULoxetine (CYMBALTA) 20 MG capsule Take 1 capsule (20 mg total) by mouth daily.  . ferrous sulfate 325 (65 FE) MG tablet Take 1 tablet (325 mg total) by mouth 2 (two) times daily with a meal.  . gabapentin (NEURONTIN) 300 MG capsule Take 1 capsule (300 mg total) by mouth 3 (three) times daily.  [name] HYDROcodone-acetaminophen (NORCO/VICODIN) 5-325 MG tablet Take 1-2 tablets by mouth every 6 (six) hours as needed for moderate pain or severe pain.  [name] levothyroxine (SYNTHROID) 50 MCG tablet Take 1 tablet (50 mcg total) by mouth daily before breakfast. Need lab work beginning of June for Thyroid  . lisinopril (ZESTRIL) 2.5 MG tablet Take 1 tablet (2.5  mg total) by mouth daily.  [name] loperamide (IMODIUM) 2 MG capsule Take 1 capsule (2 mg total) by mouth as needed for diarrhea or loose stools.  . metoprolol succinate (TOPROL-XL) 50 MG 24 hr tablet Take 1 tablet (50 mg total) by mouth daily. Take with or immediately following a meal.  . nitroGLYCERIN (NITROSTAT) 0.4 MG SL tablet Place 1 tablet under tongue every 5 minutes as needed for chest pain. (No more than 3 doses within 15 minutes)  . sulfamethoxazole-trimethoprim (BACTRIM DS) 800-160 MG tablet Take 1 tablet by mouth every 12 (twelve) hours for 10 days.  [name] torsemide (DEMADEX) 20 MG tablet Take 4 tablets (80 mg total) by mouth 2 (two) times daily.  . traZODone (DESYREL) 150 MG tablet TAKE 1 TABLET BY MOUTH AT BEDTIME AS NEEDED SLEEP  . warfarin (COUMADIN) 5 MG tablet Take 1 tablet (5 mg total) by mouth daily.   Facility-Administered Encounter Medications as of 06/26/2019  Medication  . 0.9 %  sodium chloride infusion  . acetaminophen (TYLENOL) tablet 650 mg  . albuterol (PROVENTIL) (2.5 MG/3ML) 0.083% nebulizer solution 2.5 mg  . allopurinol (ZYLOPRIM) tablet 100 mg  . ALPRAZolam (XANAX) tablet 0.5 mg  . atorvastatin (LIPITOR) tablet 40 mg  . colchicine tablet 0.6 mg  . dextromethorphan-guaiFENesin (MUCINEX DM) 30-600 MG per 12 hr tablet 1 tablet  . DULoxetine (CYMBALTA) DR capsule 20 mg  . enoxaparin (LOVENOX) injection 150 mg  . ferrous sulfate tablet 325 mg  . furosemide (LASIX) injection 80 mg  .  gabapentin (NEURONTIN) capsule 300 mg  . hydrALAZINE (APRESOLINE) injection 5 mg  . HYDROcodone-acetaminophen (NORCO/VICODIN) 5-325 MG per tablet 1-2 tablet  . hydrOXYzine (VISTARIL) injection 25 mg  . ipratropium (ATROVENT) nebulizer solution 0.5 mg  . levothyroxine (SYNTHROID) tablet 50 mcg  . lisinopril (ZESTRIL) tablet 2.5 mg  . loperamide (IMODIUM) capsule 2 mg  . metoprolol succinate (TOPROL-XL) 24 hr tablet 50 mg  . morphine 2 MG/ML injection 2 mg  . nitroGLYCERIN (NITROSTAT) SL  tablet 0.4 mg  . sulfamethoxazole-trimethoprim (BACTRIM DS) 800-160 MG per tablet 1 tablet  . warfarin (COUMADIN) tablet 5 mg  . Warfarin - Pharmacist Dosing Inpatient     Goals Addressed            This Visit's Progress   . "I was denied more aid service, I need a aid (pt-stated)       CARE PLAN ENTRY (see longtitudinal plan of care for additional care plan information)  Current Barriers:  . ADL IADL limitations  Clinical Social Work Clinical Goal(s):  [name] Over the next 90 days, patient will work with [facility] to address needs related to be denied continued in home aid services  Interventions: . Follow up phone call from patient stating that he has been released from the hospital . Patient confirmed that [facility] services through [facility] will resume as well as wound care services . Message sent to patient's doctor and confirmed that the form has been completed and returned to [facility] for review . Advised patient to call [employer] to follow up on request form for personal care services [phone] . Patient agreed to call this social worker one he has contacted [facility] . Discussed plans with patient for ongoing care management follow up and provided patient with direct contact information for care management team   Patient Self Care Activities:  . Patient verbalizes understanding of plan to contact [employer] to file an appeal for in home services . Unable to perform ADLs independently  Please see past updates related to this goal by clicking on the "Past Updates" button in the selected goal          Follow Up Plan: SW will follow up with patient by phone over the next 7-10 business days    [place], [place] Clinical Social Worker  [facility] Care Management [phone]

## 2019-06-26 NOTE — Chronic Care Management (AMB) (Cosign Needed)
Chronic Care Management   Social Work Note  06/26/2019 Name: [name] [name] MRN: [id number] DOB: 03-02-1960  [name] [name] is a 59 y.o. year old male who sees Flinchum, Kelby Aline, FNP for primary care. The CCM team was consulted for assistance with [name] .   SDOH (Social Determinants of Health) assessments performed: No   Return phone call to  inpatient case manager regarding patient's discharge plan. Patient likely to discharge home today with [facility] services. Patient has declined hospital bed and bedside commode, however remains interested in [facility] services. Per case manager, patient may have had [facility] in the past. She will reach out to them to see if [facility] services can be resumed. This Education officer, museum discussed that patient is currently waiting on re-evaluation from [facility] for personal care services. This Education officer, museum agreed to follow up with [facility] for status of re-evaluation through [facility]     Facility-Administered Encounter Medications as of 06/26/2019  Medication  . 0.9 %  sodium chloride infusion  . acetaminophen (TYLENOL) tablet 650 mg  . albuterol (PROVENTIL) (2.5 MG/3ML) 0.083% nebulizer solution 2.5 mg  . allopurinol (ZYLOPRIM) tablet 100 mg  . ALPRAZolam (XANAX) tablet 0.5 mg  . atorvastatin (LIPITOR) tablet 40 mg  . colchicine tablet 0.6 mg  . dextromethorphan-guaiFENesin (MUCINEX DM) 30-600 MG per 12 hr tablet 1 tablet  . DULoxetine (CYMBALTA) DR capsule 20 mg  . enoxaparin (LOVENOX) injection 150 mg  . ferrous sulfate tablet 325 mg  . furosemide (LASIX) injection 80 mg  . gabapentin (NEURONTIN) capsule 300 mg  . hydrALAZINE (APRESOLINE) injection 5 mg  . HYDROcodone-acetaminophen (NORCO/VICODIN) 5-325 MG per tablet 1-2 tablet  . hydrOXYzine (VISTARIL) injection 25 mg  . ipratropium (ATROVENT) nebulizer solution 0.5 mg  . levothyroxine (SYNTHROID) tablet 50 mcg  . lisinopril (ZESTRIL) tablet 2.5 mg  . loperamide  (IMODIUM) capsule 2 mg  . metoprolol succinate (TOPROL-XL) 24 hr tablet 50 mg  . morphine 2 MG/ML injection 2 mg  . nitroGLYCERIN (NITROSTAT) SL tablet 0.4 mg  . sulfamethoxazole-trimethoprim (BACTRIM DS) 800-160 MG per tablet 1 tablet  . warfarin (COUMADIN) tablet 5 mg  . Warfarin - Pharmacist Dosing Inpatient   Outpatient Encounter Medications as of 06/26/2019  Medication Sig  . albuterol (VENTOLIN HFA) 108 (90 Base) MCG/ACT inhaler Inhale 2 puffs into the lungs every 6 (six) hours as needed for wheezing or shortness of breath.  . allopurinol (ZYLOPRIM) 100 MG tablet Take 1 tablet (100 mg total) by mouth daily.  [name] ALPRAZolam (XANAX) 0.5 MG tablet Take 1 tablet (0.5 mg total) by mouth 2 (two) times daily as needed for anxiety or sleep.  [name] atorvastatin (LIPITOR) 40 MG tablet TAKE 1 TABLET BY MOUTH ONCE DAILY  . colchicine 0.6 MG tablet 1 tab PO q 12 hrs as needed until gout flare subsides, schedule on office follow up (Patient taking differently: Take 0.6 mg by mouth daily as needed. )  . DULoxetine (CYMBALTA) 20 MG capsule Take 1 capsule (20 mg total) by mouth daily.  . ferrous sulfate 325 (65 FE) MG tablet Take 1 tablet (325 mg total) by mouth 2 (two) times daily with a meal.  . gabapentin (NEURONTIN) 300 MG capsule Take 1 capsule (300 mg total) by mouth 3 (three) times daily.  [name] HYDROcodone-acetaminophen (NORCO/VICODIN) 5-325 MG tablet Take 1-2 tablets by mouth every 6 (six) hours as needed for moderate pain or severe pain.  [name] levothyroxine (SYNTHROID) 50 MCG tablet Take 1 tablet (50 mcg  total) by mouth daily before breakfast. Need lab work beginning of June for Thyroid  . lisinopril (ZESTRIL) 2.5 MG tablet Take 1 tablet (2.5 mg total) by mouth daily.  [name] loperamide (IMODIUM) 2 MG capsule Take 1 capsule (2 mg total) by mouth as needed for diarrhea or loose stools.  . metoprolol succinate (TOPROL-XL) 50 MG 24 hr tablet Take 1 tablet (50 mg total) by mouth daily. Take with or immediately  following a meal.  . nitroGLYCERIN (NITROSTAT) 0.4 MG SL tablet Place 1 tablet under tongue every 5 minutes as needed for chest pain. (No more than 3 doses within 15 minutes)  . sulfamethoxazole-trimethoprim (BACTRIM DS) 800-160 MG tablet Take 1 tablet by mouth every 12 (twelve) hours for 10 days.  [name] torsemide (DEMADEX) 20 MG tablet Take 4 tablets (80 mg total) by mouth 2 (two) times daily.  . traZODone (DESYREL) 150 MG tablet TAKE 1 TABLET BY MOUTH AT BEDTIME AS NEEDED SLEEP  . warfarin (COUMADIN) 5 MG tablet Take by mouth daily as directed by the anti-coag clinic (Patient not taking: Reported on 06/22/2019)  . warfarin (COUMADIN) 5 MG tablet Take 1 tablet (5 mg total) by mouth daily.    Goals Addressed   None     Follow Up Plan: SW will follow up with patient by phone over the next 7-10 days    [name] [name], [place] Clinical Social Worker  [facility] Care Management [phone]

## 2019-06-26 NOTE — TOC Initial Note (Signed)
Transition of Care [facility]) - Initial/Assessment Note    Patient Details  Name: [name] [name] MRN: [id number] Date of Birth: 1960/07/29  Transition of Care [facility]) CM/SW Contact:    Beverly Sessions, RN Phone Number: 06/26/2019, 2:42 PM  Clinical Narrative:                 Patient admitted from home with CHF Patient lives at home alone States he has 1 living sister who is in poor health and live in [place]  PCP [facility]  Patient states that he uses Medicaid transport to get to appointments, and has his own minivan that he uses to run errands  Patient states he has a "cadalliac" walker, and a power wheelchair.  Patient states that he previously had Medicaid PCS services through [place].  However he was not decertified, and his services discontinued.  Per the outpatient CSW at this doctors office patient is awaiting reevaluation for [facility] services.  She will follow up on that  Patient is open with [facility] home health services for RN.  Patient to discharge today.  Patient is agreeable to home health services, but declines for PT to be added.  SW and aide added to home health.  [place] with [facility] notified of discharge.  MD updated  MD has ordered [facility] and hospital bed for discharge.  Patient declines both.  MD notified   Patient to have a friend pick him up at discharge   Expected Discharge Plan: [place] Barriers to Discharge: No Barriers Identified   Patient Goals and CMS Choice        Expected Discharge Plan and Services Expected Discharge Plan: [place]   Discharge Planning Services: CM Consult   Living arrangements for the past 2 months: Apartment Expected Discharge Date: 06/26/19                         HH Arranged: RN, Nurse's Aide, Social Work [employer] Agency: [facility] Date [place]: 06/26/19   Representative spoke with at [place]: brittany  Prior Living  Arrangements/Services Living arrangements for the past 2 months: Apartment Lives with:: Self Patient language and need for interpreter reviewed:: Yes Do you feel safe going back to the place where you live?: Yes      Need for Family Participation in Patient Care: Yes (Comment) Care giver support system in place?: No (comment) Current home services: DME, Home RN    Activities of Daily Living Home Assistive Devices/Equipment: Environmental consultant (specify type), Wheelchair, Grab bars in shower, Shower chair with back ADL Screening (condition at time of admission) Patient's cognitive ability adequate to safely complete daily activities?: Yes Is the patient deaf or have difficulty hearing?: No Does the patient have difficulty seeing, even when wearing glasses/contacts?: No Does the patient have difficulty concentrating, remembering, or making decisions?: No Patient able to express need for assistance with ADLs?: Yes Does the patient have difficulty dressing or bathing?: Yes Independently performs ADLs?: No Communication: Independent Dressing (OT): Needs assistance Is this a change from baseline?: Pre-admission baseline Grooming: Independent Feeding: Independent Bathing: Needs assistance Is this a change from baseline?: Pre-admission baseline Toileting: Independent In/Out Bed: Independent Walks in Home: Independent Does the patient have difficulty walking or climbing stairs?: Yes Weakness of Legs: Both Weakness of Arms/Hands: None  Permission Sought/Granted                  Emotional Assessment  Orientation: : Oriented to Self, Oriented to Place, Oriented to  Time, Oriented to Situation   Psych Involvement: No (comment)  Admission diagnosis:  Acute on chronic systolic CHF (congestive heart failure) (HCC) [I50.23] Cellulitis due to methicillin-resistant Staphylococcus aureus (MRSA) [L03.90, B95.62] Patient Active Problem List   Diagnosis Date Noted  . Pressure injury of skin  06/26/2019  . Cellulitis due to methicillin-resistant Staphylococcus aureus (MRSA) 06/23/2019  . Iron deficiency anemia 06/22/2019  . Depression 06/22/2019  . Elevated troponin 06/22/2019  . Left-sided Bell's palsy 05/08/2019  . Slurred speech 05/03/2019  . Chronic intractable headache 05/03/2019  . Head injuries, initial encounter 05/03/2019  . Hand pain, left 05/03/2019  . Noncompliance by refusing service 05/03/2019  . Facial droop 05/02/2019  . Pharmacologic therapy 04/11/2019  . Disorder of skeletal system 04/11/2019  . Problems influencing health status 04/11/2019  . Chronic anticoagulation (warfarin  COUMADIN) 04/11/2019  . Hypocalcemia 04/11/2019  . Elevated sed rate 04/11/2019  . Elevated C-reactive protein (CRP) 04/11/2019  . Elevated hemoglobin A1c 04/11/2019  . Hypoalbuminemia 04/11/2019  . Edema due to hypoalbuminemia 04/11/2019  . Elevated brain natriuretic peptide (BNP) level 04/11/2019  . Chronic hip pain (Right) 04/11/2019  . Osteoarthritis of hip (Right) 04/11/2019  . Atrial fibrillation with RVR (HCC) 03/08/2019  . Degenerative joint disease of right hip 03/05/2019  . Hypothyroidism 03/04/2019  . Chronic venous stasis dermatitis of both lower extremities 03/04/2019  . Long term (current) use of anticoagulants 02/23/2019  . Cellulitis 01/22/2019  . PE (pulmonary thromboembolism) (HCC) 01/21/2019  . Open leg wound 01/21/2019  . HLD (hyperlipidemia) 01/21/2019  . CAD (coronary artery disease) 01/21/2019  . Lower extremity cellulitis 01/21/2019  . Noncompliance 01/09/2019  . Acquired thrombophilia (HCC) 11/28/2018  . Subtherapeutic international normalized ratio (INR) 11/11/2018  . Acute pulmonary embolism (HCC) 11/01/2018  . Depression, major, single episode, moderate (HCC) 09/17/2018  . Chest pain 08/06/2018  . Personal history of DVT (deep vein thrombosis) 07/13/2018  . History of pulmonary embolism (on Coumadin) 07/13/2018  . Chronic low back pain  (Bilateral)  w/ sciatica (Bilateral) 07/13/2018  . TBI (traumatic brain injury) (HCC) 07/04/2018  . Anemia 06/27/2018  . Abnormal thyroid blood test 06/27/2018  . Diet-controlled diabetes mellitus (HCC) 06/06/2018  . HTN (hypertension) 06/06/2018  . Chronic gout involving toe, unspecified cause, unspecified laterality 06/06/2018  . Morbid obesity (HCC) 06/06/2018  . Chronic pain syndrome 06/06/2018  . Ulcers of both lower extremities, limited to breakdown of skin (HCC) 06/06/2018  . Gout 06/06/2018  . Persistent atrial fibrillation (HCC)   . Chronic combined systolic and diastolic heart failure (HCC) 02/02/2018  . COPD (chronic obstructive pulmonary disease) (HCC) 02/02/2018  . Obstructive sleep apnea 02/02/2018  . Lymphedema 02/02/2018  . Acute on chronic systolic CHF (congestive heart failure) (HCC) 02/02/2018  . Acute respiratory failure with hypoxia (HCC) 01/27/2018   PCP:  Berniece Pap, FNP Pharmacy:   [facility] [place], [place] - [address] [place] [address] Phone: [phone] Fax: [phone]     Social Determinants of Health (SDOH) Interventions    Readmission Risk Interventions Readmission Risk Prevention Plan 01/25/2019 10/25/2018 08/08/2018  Transportation Screening Complete Complete Complete  PCP or Specialist Appt within 3-5 Days - Complete Complete  HRI or Home Care Consult - Complete Complete  Social Work Consult for Recovery Care Planning/Counseling - Complete Patient refused  Palliative Care Screening - Not Applicable Not Applicable  Medication Review (RN Care Manager) Complete Complete Complete  PCP or Specialist appointment within 3-5 days of discharge Complete - -  HRI or Home Care Consult Complete - -  SW Recovery Care/Counseling Consult Complete - -  Palliative Care Screening Not Applicable - -  Skilled Nursing Facility Not Applicable - -

## 2019-06-26 NOTE — Patient Instructions (Addendum)
Thank you allowing the Chronic Care Management Team to be a part of your care! It was a pleasure speaking with you today!  1. Please follow up with [facility] regarding your personal care service evaluation [phone]  CCM (Chronic Care Management) Team   Juanell Fairly  RN, BSN Nurse Care Coordinator  [phone]   Khush Pasion [address] LCSW Clinical Social Worker [phone]  Goals Addressed            This Visit's Progress   . "I was denied more aid service, I need a aid (pt-stated)       CARE PLAN ENTRY (see longtitudinal plan of care for additional care plan information)  Current Barriers:  . ADL IADL limitations  Clinical Social Work Clinical Goal(s):  [name] Over the next 90 days, patient will work with [facility] to address needs related to be denied continued in home aid services  Interventions: . Follow up phone call from patient stating that he has been released from the hospital . Patient confirmed that [facility] services through [facility] will resume as well as wound care services . Message sent to patient's doctor and confirmed that the form has been completed and returned to [facility] for review . Advised patient to call [employer] to follow up on request form for personal care services [phone] . Patient agreed to call this social worker one he has contacted [facility] . Discussed plans with patient for ongoing care management follow up and provided patient with direct contact information for care management team   Patient Self Care Activities:  . Patient verbalizes understanding of plan to contact [employer] to file an appeal for in home services . Unable to perform ADLs independently  Please see past updates related to this goal by clicking on the "Past Updates" button in the selected goal          The patient verbalized understanding of instructions provided today and declined a print copy of patient  instruction materials.   Telephone follow up appointment with care management team member scheduled for: 07/04/19

## 2019-06-26 NOTE — Discharge Summary (Signed)
Physician Discharge Summary  [name] [name] FQM:[id number] DOB: 14-May-1960 DOA: 06/22/2019  PCP: Berniece Pap, FNP  Admit date: 06/22/2019 Discharge date: 06/26/2019  Admitted From: Home Disposition:  Home  Recommendations for Outpatient Follow-up:  1. Follow up with PCP in 1-2 weeks 2. Follow-up in Coumadin clinic in 2 to 3 days to check INR. 3. Follow-up with wound care clinic. 4. Follow-up with heart failure clinic. 5. Please obtain BMP/CBC in one week 6. Please follow up on the following pending results:None  Home Health: Yes Equipment/Devices: Hospital bed, bedside commode, wheelchair Discharge Condition: Fair CODE STATUS: Full Diet recommendation: Heart Healthy / Carb Modified   Brief/Interim Summary: [name] Hubbardis a 59 y.o.malewith medical history significant ofsCHF with EF of 25-30%, left bundle blockade, hypertension, hyperlipidemia, COPD, asthma, hypothyroidism, gout, depression, atrial fibrillation, PE/DVT on Coumadin, OSA on CPAP, CAD, iron deficiency anemia, who presents with shortness breath and some nausea.  He also has bilateral lower extremity cellulitis, left worse than right with multiple wounds.  Recently treated with outpatient antibiotic with no response.  Chest x-ray with cardiomegaly, vascular congestion and interstitial pulmonary edema.  Admitted for acute on chronic systolic heart failure, initially treated with IV Lasix 80 mg twice daily with a good response.  Patient was feeling better and able to sleep and night before discharge.  Patient had an echo done on 03/24/2018 with EF of 25 to 30%.  Repeat echocardiogram done during current hospitalization with normal EF and grade 1 diastolic dysfunction.  He was discharged home on a higher dose of torsemide at 80 mg twice daily.  He was taking 60 mg twice daily at home.  Renal function remained stable.  PT/OT did recommended SNF placement which he declined and wants to go back home with maximum home health  services which were ordered.  Patient with bilateral lower extremity wounds, left worse than right.  He followed up at the wound care center and a recent Wound culture done on 06/01/2019 was growing MRSA and Enterobacter cloacae, he was treated first with doxycycline for 2 weeks.  Apparently also given Bactrim which he never took yet.  During hospitalization he was treated with linezolid and cefepime and discharged on Bactrim to complete a 2-week course.  He will follow-up with his wound care clinic for further management. Patient continued to complain about pain in bilateral lower extremities and right hip.  He was given some Norco and explained that he needs to follow-up with primary care physician for further need.  Patient occasionally develops some anxiety attacks, responded well to as needed Xanax.  He was given few pills at his request and will follow up with his primary care physician for further needs.  We discussed using Xanax or opioids along with his hypoventilation syndrome can increase his risk of slow breathing and even can cause death.  Patient seems understanding and will try to keep these things in mind if needing them.  He was also advised to continue using his CPAP whenever resting during the day and at night.  Patient has an history of PE and DVT and was on Coumadin.  Apparently before admission he was not taking his Coumadin for the past few days and found to have subtherapeutic INR.  He was bridged initially with heparin and then Lovenox.  INR was one 2.1 on the day of discharge.  He was discharged on Coumadin dose of 5 mg daily and will need a close follow-up at Coumadin clinic as patient is on Bactrim which can increase INR when given with Coumadin.  He was also found to be in A. fib with RVR on admission requiring IV Cardizem initially.  Currently  rate well controlled with metoprolol and he will continue with Coumadin.  Patient with morbid obesity which will complicate his prognosis.  He will continue with rest of his home meds.  Discharge Diagnoses:  Principal Problem:   Acute on chronic systolic CHF (congestive heart failure) (HCC) Active Problems:   COPD (chronic obstructive pulmonary disease) (HCC)   HTN (hypertension)   Personal history of DVT (deep vein thrombosis)   Atrial fibrillation with RVR (HCC)   Chest pain   PE (pulmonary thromboembolism) (HCC)   HLD (hyperlipidemia)   CAD (coronary artery disease)   Lower extremity cellulitis   Hypothyroidism   Iron deficiency anemia   Depression   Elevated troponin   Cellulitis due to methicillin-resistant Staphylococcus aureus (MRSA)   Pressure injury of skin  Discharge Instructions  Discharge Instructions    Diet - low sodium heart healthy   Complete by: As directed    Discharge instructions   Complete by: As directed    It was pleasure taking care of you. Please follow-up with your Coumadin clinic within the next 2 to 3 days to check your INR. We gave you Bactrim for another 10 days to complete the course. Please follow-up with your wound care physician. We also increase the dose of torsemide to 80 mg twice daily, follow-up with your heart failure clinic for further management. You are being given some pain medications and Xanax, please take it as directed as overdose can slow down your breathing and even cause death. Continue using your CPAP whenever sleeping either during the day or night.   Increase activity slowly   Complete by: As directed      Allergies as of 06/26/2019   No Known Allergies     Medication List    STOP taking these medications   atorvastatin 40 MG tablet Commonly known as: LIPITOR     TAKE these medications   albuterol 108 (90 Base) MCG/ACT inhaler Commonly known as: VENTOLIN HFA Inhale 2 puffs into the lungs every 6 (six) hours  as needed for wheezing or shortness of breath.   allopurinol 100 MG tablet Commonly known as: ZYLOPRIM Take 1 tablet (100 mg total) by mouth daily.   ALPRAZolam 0.5 MG tablet Commonly known as: XANAX Take 1 tablet (0.5 mg total) by mouth 2 (two) times daily as needed for anxiety or sleep.   colchicine 0.6 MG tablet 1 tab PO q 12 hrs as needed until gout flare subsides, schedule on office follow up What changed:   how much to take  how to take this  when to take this  reasons to take this  additional instructions   DULoxetine 20 MG capsule Commonly known as: CYMBALTA Take 1 capsule (20 mg total) by mouth daily.   ferrous sulfate 325 (65 FE) MG tablet Take 1 tablet (325 mg total) by mouth 2 (two) times daily with  a meal.   gabapentin 300 MG capsule Commonly known as: NEURONTIN Take 1 capsule (300 mg total) by mouth 3 (three) times daily.   HYDROcodone-acetaminophen 5-325 MG tablet Commonly known as: NORCO/VICODIN Take 1-2 tablets by mouth every 6 (six) hours as needed for moderate pain or severe pain.   levothyroxine 50 MCG tablet Commonly known as: SYNTHROID Take 1 tablet (50 mcg total) by mouth daily before breakfast. Need lab work beginning of June for Thyroid   lisinopril 2.5 MG tablet Commonly known as: ZESTRIL Take 1 tablet (2.5 mg total) by mouth daily.   loperamide 2 MG capsule Commonly known as: IMODIUM Take 1 capsule (2 mg total) by mouth as needed for diarrhea or loose stools.   metoprolol succinate 50 MG 24 hr tablet Commonly known as: TOPROL-XL Take 1 tablet (50 mg total) by mouth daily. Take with or immediately following a meal.   nitroGLYCERIN 0.4 MG SL tablet Commonly known as: NITROSTAT Place 1 tablet under tongue every 5 minutes as needed for chest pain. (No more than 3 doses within 15 minutes)   sulfamethoxazole-trimethoprim 800-160 MG tablet Commonly known as: BACTRIM DS Take 1 tablet by mouth every 12 (twelve) hours for 10 days.    torsemide 20 MG tablet Commonly known as: DEMADEX Take 4 tablets (80 mg total) by mouth 2 (two) times daily. What changed: how much to take   traZODone 150 MG tablet Commonly known as: DESYREL TAKE 1 TABLET BY MOUTH AT BEDTIME AS NEEDED SLEEP   warfarin 5 MG tablet Commonly known as: Coumadin Take as directed. If you are unsure how to take this medication, talk to your nurse or doctor. Original instructions: Take 1 tablet (5 mg total) by mouth daily. What changed:   how much to take  how to take this  when to take this  additional instructions            Durable Medical Equipment  (From admission, onward)         Start     Ordered   06/25/19 1321  For home use only DME Hospital bed  Once    Question Answer Comment  Length of Need Lifetime   Bed type Heavy-duty, semi-electric (for patients >350 lbs.)   Support Surface: Gel Overlay      06/25/19 1321   06/25/19 1321  For home use only DME Bedside commode  Once    Question:  Patient needs a bedside commode to treat with the following condition  Answer:  Generalized weakness   06/25/19 1321          No Known Allergies  Consultations:  None  Procedures/Studies: DG Chest Portable 1 View  Result Date: 06/22/2019 CLINICAL DATA:  Shortness of breath EXAM: PORTABLE CHEST 1 VIEW COMPARISON:  03/04/2019 FINDINGS: 0907 hours. The cardio pericardial silhouette is enlarged. Pulmonary vascular congestion noted with interstitial pulmonary edema pattern. No dense focal airspace consolidation. No appreciable pleural effusion. The visualized bony structures of the thorax are intact. Telemetry leads overlie the chest. IMPRESSION: Cardiomegaly with vascular congestion and probable interstitial edema. Electronically Signed   By: Kennith Center M.D.   On: 06/22/2019 09:19   ECHOCARDIOGRAM COMPLETE  Result Date: 06/23/2019    ECHOCARDIOGRAM REPORT   Patient Name:   [name] [name] Date of Exam: 06/23/2019 Medical Rec #:  [id number]        Height:       68.0 in Accession #:    [id number]      Weight:  408.9 lb Date of Birth:  1960-07-19       BSA:          2.770 m Patient Age:    58 years        BP:           119/48 mmHg Patient Gender: M               HR:           61 bpm. Exam Location:  [facility] Procedure: 2D Echo, Cardiac Doppler and Color Doppler Indications:     CHF- acute diastolic 428.31  History:         Patient has prior history of Echocardiogram examinations, most                  recent 03/24/2018. CHF, COPD; Arrythmias:Atrial Fibrillation. MI,                  history of PE.  Sonographer:     Cristela Blue RDCS (AE) Referring Phys:  Kern Reap Brien Few NIU Diagnosing Phys: Adrian Blackwater MD  Sonographer Comments: Technically difficult study due to poor echo windows, no apical window and no subcostal window. IMPRESSIONS  1. Left ventricular ejection fraction, by estimation, is 55 to 60%. The left ventricle has normal function. The left ventricle has no regional wall motion abnormalities. Left ventricular diastolic parameters are consistent with Grade I diastolic dysfunction (impaired relaxation).  2. Right ventricular systolic function is normal. The right ventricular size is normal.  3. The mitral valve is normal in structure. No evidence of mitral valve regurgitation. No evidence of mitral stenosis.  4. The aortic valve is normal in structure. Aortic valve regurgitation is not visualized. No aortic stenosis is present.  5. The inferior vena cava is normal in size with greater than 50% respiratory variability, suggesting right atrial pressure of 3 mmHg. FINDINGS  Left Ventricle: Left ventricular ejection fraction, by estimation, is 55 to 60%. The left ventricle has normal function. The left ventricle has no regional wall motion abnormalities. The left ventricular internal cavity size was normal in size. There is  no left ventricular hypertrophy. Left ventricular diastolic parameters are consistent with Grade I diastolic dysfunction (impaired  relaxation). Right Ventricle: The right ventricular size is normal. No increase in right ventricular wall thickness. Right ventricular systolic function is normal. Left Atrium: Left atrial size was normal in size. Right Atrium: Right atrial size was normal in size. Pericardium: There is no evidence of pericardial effusion. Mitral Valve: The mitral valve is normal in structure. Normal mobility of the mitral valve leaflets. No evidence of mitral valve regurgitation. No evidence of mitral valve stenosis. Tricuspid Valve: The tricuspid valve is normal in structure. Tricuspid valve regurgitation is not demonstrated. No evidence of tricuspid stenosis. Aortic Valve: The aortic valve is normal in structure. Aortic valve regurgitation is not visualized. No aortic stenosis is present. Pulmonic Valve: The pulmonic valve was normal in structure. Pulmonic valve regurgitation is not visualized. No evidence of pulmonic stenosis. Aorta: The aortic root is normal in size and structure. Venous: The inferior vena cava is normal in size with greater than 50% respiratory variability, suggesting right atrial pressure of 3 mmHg. IAS/Shunts: No atrial level shunt detected by color flow Doppler.  LEFT VENTRICLE PLAX 2D LVIDd:         5.10 cm LVIDs:         3.49 cm LV PW:         1.22 cm LV IVS:  1.31 cm LVOT diam:     2.30 cm LVOT Area:     4.15 cm  LEFT ATRIUM         Index LA diam:    4.90 cm 1.77 cm/m   AORTA Ao Root diam: 3.40 cm  SHUNTS Systemic Diam: 2.30 cm Neoma Laming MD Electronically signed by Neoma Laming MD Signature Date/Time: 06/23/2019/6:12:53 PM    Final      Subjective: Patient was feeling better when seen today.  No new complaints.  Stating that he slept well last night.  He was asking for some pain meds and Xanax to go home.  I told him that I can provide few pills and he needs to follow-up with his primary care physician for further needs.  Discharge Exam: Vitals:   06/26/19 0512 06/26/19 0513  BP:  (!)  113/58  Pulse:  (!) 45  Resp:  20  Temp: (!) 97.2 F (36.2 C)   SpO2:  96%   Vitals:   06/25/19 2006 06/25/19 2036 06/26/19 0512 06/26/19 0513  BP:  (!) 147/83  (!) 113/58  Pulse:  75  (!) 45  Resp:  20  20  Temp:  98.4 F (36.9 C) (!) 97.2 F (36.2 C)   TempSrc:   Axillary   SpO2: 94% 96%  96%  Weight:      Height:        General: Pt is alert, awake, not in acute distress.  Morbidly obese Cardiovascular: RRR, S1/S2 +, no rubs, no gallops Respiratory: CTA bilaterally, no wheezing, no rhonchi Abdominal: Soft, NT, ND, bowel sounds + Extremities: 3+ LE  edema, bilateral Unna boots, no cyanosis   The results of significant diagnostics from this hospitalization (including imaging, microbiology, ancillary and laboratory) are listed below for reference.    Microbiology: Recent Results (from the past 240 hour(s))  Blood culture (routine x 2)     Status: None (Preliminary result)   Collection Time: 06/22/19  8:52 AM   Specimen: BLOOD  Result Value Ref Range Status   Specimen Description BLOOD LEFT ANTECUBITAL  Final   Special Requests   Final    BOTTLES DRAWN AEROBIC AND ANAEROBIC Blood Culture adequate volume   Culture   Final    NO GROWTH 4 DAYS Performed at [facility], [address], [place], [place] [address]    Report Status PENDING  Incomplete  Respiratory Panel by RT PCR (Flu A&B, Covid) - Nasopharyngeal Swab     Status: None   Collection Time: 06/22/19  8:52 AM   Specimen: Nasopharyngeal Swab  Result Value Ref Range Status   SARS Coronavirus 2 by RT PCR NEGATIVE NEGATIVE Final    Comment: (NOTE) SARS-CoV-2 target nucleic acids are NOT DETECTED. The SARS-CoV-2 RNA is generally detectable in upper respiratoy specimens during the acute phase of infection. The lowest concentration of SARS-CoV-2 viral copies this assay can detect is 131 copies/mL. A negative result does not preclude SARS-Cov-2 infection and should not be used as the sole basis for  treatment or other patient management decisions. A negative result may occur with  improper specimen collection/handling, submission of specimen other than nasopharyngeal swab, presence of viral mutation(s) within the areas targeted by this assay, and inadequate number of viral copies (<131 copies/mL). A negative result must be combined with clinical observations, patient history, and epidemiological information. The expected result is Negative. Fact Sheet for Patients:  PinkCheek.be Fact Sheet for Healthcare Providers:  GravelBags.it This test is not yet ap  proved or cleared by the [place] and  has been authorized for detection and/or diagnosis of SARS-CoV-2 by FDA under an Emergency Use Authorization (EUA). This EUA will remain  in effect (meaning this test can be used) for the duration of the COVID-19 declaration under Section 564(b)(1) of the Act, 21 U.S.C. section 360bbb-3(b)(1), unless the authorization is terminated or revoked sooner.    Influenza A by PCR NEGATIVE NEGATIVE Final   Influenza B by PCR NEGATIVE NEGATIVE Final    Comment: (NOTE) The Xpert Xpress SARS-CoV-2/FLU/RSV assay is intended as an aid in  the diagnosis of influenza from Nasopharyngeal swab specimens and  should not be used as a sole basis for treatment. Nasal washings and  aspirates are unacceptable for Xpert Xpress SARS-CoV-2/FLU/RSV  testing. Fact Sheet for Patients: https://www.moore.com/ Fact Sheet for Healthcare Providers: https://www.young.biz/ This test is not yet approved or cleared by the [place] FDA and  has been authorized for detection and/or diagnosis of SARS-CoV-2 by  FDA under an Emergency Use Authorization (EUA). This EUA will remain  in effect (meaning this test can be used) for the duration of the  Covid-19 declaration under Section 564(b)(1) of the Act, 21  U.S.C. section  360bbb-3(b)(1), unless the authorization is  terminated or revoked. Performed at [facility], [address] [place], [place] [address]   Blood culture (routine x 2)     Status: None (Preliminary result)   Collection Time: 06/22/19  9:23 AM   Specimen: BLOOD  Result Value Ref Range Status   Specimen Description BLOOD RIGHT ANTECUBITAL  Final   Special Requests   Final    BOTTLES DRAWN AEROBIC AND ANAEROBIC Blood Culture adequate volume   Culture   Final    NO GROWTH 4 DAYS Performed at [facility], [address] [place], [place] [address]    Report Status PENDING  Incomplete  MRSA PCR Screening     Status: Abnormal   Collection Time: 06/25/19 10:56 AM   Specimen: Nasopharyngeal  Result Value Ref Range Status   MRSA by PCR POSITIVE (A) NEGATIVE Final    Comment:        The GeneXpert MRSA Assay (FDA approved for NASAL specimens only), is one component of a comprehensive MRSA colonization surveillance program. It is not intended to diagnose MRSA infection nor to guide or monitor treatment for MRSA infections. RESULT CALLED TO, READ BACK BY AND VERIFIED WITH: Louretta Parma RN AT 1454 ON 06/25/19 SNG Performed at [facility], [address] [place], [place] [address]      Labs: BNP (last 3 results) Recent Labs    03/04/19 1755 05/02/19 1546 06/22/19 0852  BNP 189.0* 44.1 118.0*   Basic Metabolic Panel: Recent Labs  Lab 06/22/19 0852 06/23/19 0809 06/24/19 0558 06/25/19 0441 06/26/19 0630  NA 139 135 136 136 135  K 3.9 3.9 3.8 4.2 4.2  CL 103 96* 100 99 95*  CO2 24 26 26 29 31   GLUCOSE 142* 148* 205* 101* 116*  BUN 6 11 20  25* 27*  CREATININE 0.77 1.00 1.12 1.07 1.11  CALCIUM 7.8* 8.4* 8.5* 8.8* 8.8*  MG  --  1.7  --   --   --    Liver Function Tests: Recent Labs  Lab 06/22/19 0852  AST 28  ALT 19  ALKPHOS 93  BILITOT 0.8  PROT 7.3  ALBUMIN 3.1*   Recent Labs  Lab 06/23/19 1421  LIPASE 18   No results for  input(s): AMMONIA in the last 168 hours. CBC: Recent Labs  Lab 06/22/19 0852 06/23/19 0809 06/24/19 0558 06/25/19 0441  WBC 8.8 6.7 5.8 10.4  NEUTROABS 6.9  --   --   --   HGB 11.7* 12.4* 10.7* 11.6*  HCT 36.3* 39.0 32.9* 35.9*  MCV 87.9 88.2 87.5 88.4  PLT 255 252 173 231   Cardiac Enzymes: No results for input(s): CKTOTAL, CKMB, CKMBINDEX, TROPONINI in the last 168 hours. BNP: Invalid input(s): POCBNP CBG: No results for input(s): GLUCAP in the last 168 hours. D-Dimer No results for input(s): DDIMER in the last 72 hours. Hgb A1c No results for input(s): HGBA1C in the last 72 hours. Lipid Profile No results for input(s): CHOL, HDL, LDLCALC, TRIG, CHOLHDL, LDLDIRECT in the last 72 hours. Thyroid function studies No results for input(s): TSH, T4TOTAL, T3FREE, THYROIDAB in the last 72 hours.  Invalid input(s): FREET3 Anemia work up No results for input(s): VITAMINB12, FOLATE, FERRITIN, TIBC, IRON, RETICCTPCT in the last 72 hours. Urinalysis    Component Value Date/Time   COLORURINE YELLOW (A) 06/24/2019 1800   APPEARANCEUR HAZY (A) 06/24/2019 1800   APPEARANCEUR Hazy (A) 12/25/2018 1442   LABSPEC 1.025 06/24/2019 1800   PHURINE 6.0 06/24/2019 1800   GLUCOSEU NEGATIVE 06/24/2019 1800   HGBUR NEGATIVE 06/24/2019 1800   BILIRUBINUR NEGATIVE 06/24/2019 1800   BILIRUBINUR Negative 12/25/2018 1442   KETONESUR NEGATIVE 06/24/2019 1800   PROTEINUR NEGATIVE 06/24/2019 1800   NITRITE NEGATIVE 06/24/2019 1800   LEUKOCYTESUR SMALL (A) 06/24/2019 1800   Sepsis Labs Invalid input(s): PROCALCITONIN,  WBC,  LACTICIDVEN Microbiology Recent Results (from the past 240 hour(s))  Blood culture (routine x 2)     Status: None (Preliminary result)   Collection Time: 06/22/19  8:52 AM   Specimen: BLOOD  Result Value Ref Range Status   Specimen Description BLOOD LEFT ANTECUBITAL  Final   Special Requests   Final    BOTTLES DRAWN AEROBIC AND ANAEROBIC Blood Culture adequate volume    Culture   Final    NO GROWTH 4 DAYS Performed at [facility] [address] [place], [place] [address]    Report Status PENDING  Incomplete  Respiratory Panel by RT PCR (Flu A&B, Covid) - Nasopharyngeal Swab     Status: None   Collection Time: 06/22/19  8:52 AM   Specimen: Nasopharyngeal Swab  Result Value Ref Range Status   SARS Coronavirus 2 by RT PCR NEGATIVE NEGATIVE Final    Comment: (NOTE) SARS-CoV-2 target nucleic acids are NOT DETECTED. The SARS-CoV-2 RNA is generally detectable in upper respiratoy specimens during the acute phase of infection. The lowest concentration of SARS-CoV-2 viral copies this assay can detect is 131 copies/mL. A negative result does not preclude SARS-Cov-2 infection and should not be used as the sole basis for treatment or other patient management decisions. A negative result may occur with  improper specimen collection/handling, submission of specimen other than nasopharyngeal swab, presence of viral mutation(s) within the areas targeted by this assay, and inadequate number of viral copies (<131 copies/mL). A negative result must be combined with clinical observations, patient history, and epidemiological information. The expected result is Negative. Fact Sheet for Patients:  https://www.moore.com/ Fact Sheet for Healthcare Providers:  https://www.young.biz/ This test is not yet ap proved or cleared by the [place] FDA and  has been authorized for detection and/or diagnosis of SARS-CoV-2 by FDA under an Emergency Use Authorization (EUA). This EUA will remain  in effect (meaning this test can  be used) for the duration of the COVID-19 declaration under Section 564(b)(1) of the Act, 21 U.S.C. section 360bbb-3(b)(1), unless the authorization is terminated or revoked sooner.    Influenza A by PCR NEGATIVE NEGATIVE Final   Influenza B by PCR NEGATIVE NEGATIVE Final    Comment: (NOTE) The Xpert  Xpress SARS-CoV-2/FLU/RSV assay is intended as an aid in  the diagnosis of influenza from Nasopharyngeal swab specimens and  should not be used as a sole basis for treatment. Nasal washings and  aspirates are unacceptable for Xpert Xpress SARS-CoV-2/FLU/RSV  testing. Fact Sheet for Patients: https://www.moore.com/ Fact Sheet for Healthcare Providers: https://www.young.biz/ This test is not yet approved or cleared by the [place] FDA and  has been authorized for detection and/or diagnosis of SARS-CoV-2 by  FDA under an Emergency Use Authorization (EUA). This EUA will remain  in effect (meaning this test can be used) for the duration of the  Covid-19 declaration under Section 564(b)(1) of the Act, 21  U.S.C. section 360bbb-3(b)(1), unless the authorization is  terminated or revoked. Performed at [facility], [address] [place], [place] [address]   Blood culture (routine x 2)     Status: None (Preliminary result)   Collection Time: 06/22/19  9:23 AM   Specimen: BLOOD  Result Value Ref Range Status   Specimen Description BLOOD RIGHT ANTECUBITAL  Final   Special Requests   Final    BOTTLES DRAWN AEROBIC AND ANAEROBIC Blood Culture adequate volume   Culture   Final    NO GROWTH 4 DAYS Performed at [facility], [address] [address], [place], [place] [address]    Report Status PENDING  Incomplete  MRSA PCR Screening     Status: Abnormal   Collection Time: 06/25/19 10:56 AM   Specimen: Nasopharyngeal  Result Value Ref Range Status   MRSA by PCR POSITIVE (A) NEGATIVE Final    Comment:        The GeneXpert MRSA Assay (FDA approved for NASAL specimens only), is one component of a comprehensive MRSA colonization surveillance program. It is not intended to diagnose MRSA infection nor to guide or monitor treatment for MRSA infections. RESULT CALLED TO, READ BACK BY AND VERIFIED WITH: Louretta Parma RN AT 1454 ON 06/25/19  SNG Performed at [facility], [address] [place], [place] [address]     Time coordinating discharge: Over 30 minutes  SIGNED:  Arnetha Courser, MD  [facility] 06/26/2019, 10:43 AM  If 7PM-7AM, please contact night-coverage [email or web address]  This record has been created using Conservation officer, historic buildings. Errors have been sought and corrected,but may not always be located. Such creation errors do not reflect on the standard of care.

## 2019-06-26 NOTE — Progress Notes (Signed)
ANTICOAGULATION CONSULT NOTE - Initial Consult  Pharmacy Consult for Warfarin with enoxaparin bridge Indication :hx pulmonary embolus/DVT and Afib  No Known Allergies  Patient Measurements: Height: 5\' 8"  (172.7 cm) Weight: (!) 196.9 kg (434 lb) IBW/kg (Calculated) : 68.4 Heparin Dosing Weight:  114.6 kg  Vital Signs: Temp: 97.2 F (36.2 C) (05/11 0512) Temp Source: Axillary (05/11 0512) BP: 113/58 (05/11 0513) Pulse Rate: 45 (05/11 0513)  Labs: Recent Labs    06/23/19 0809 06/23/19 0809 06/23/19 1421 06/24/19 0558 06/25/19 0441 06/26/19 0630  HGB 12.4*   < >  --  10.7* 11.6*  --   HCT 39.0  --   --  32.9* 35.9*  --   PLT 252  --   --  173 231  --   LABPROT 15.2   < >  --  16.5* 21.1* 22.9*  INR 1.3*   < >  --  1.4* 1.9* 2.1*  HEPARINUNFRC 0.51  --  0.35 0.25*  --   --   CREATININE 1.00   < >  --  1.12 1.07 1.11   < > = values in this interval not displayed.    Estimated Creatinine Clearance: 122.9 mL/min (by C-G formula based on SCr of 1.11 mg/dL).   Medical History: Past Medical History:  Diagnosis Date  . Anxiety   . Arthritis   . Asthma   . Brain damage   . Chronic pain of both knees 07/13/2018  . Clotting disorder (HCC)   . COPD (chronic obstructive pulmonary disease) (HCC)   . Depression   . HFrEF (heart failure with reduced ejection fraction) (HCC)    a. 03/2018 Echo: EF 25-30%, diff HK. Mod LAE.  04/2018 History of DVT (deep vein thrombosis)   . History of pulmonary embolism    a. Chronic coumadin.  [name] Hypertension   . MI (myocardial infarction) (HCC)   . Morbid obesity (HCC)   . Neck pain 07/13/2018  . NICM (nonischemic cardiomyopathy) (HCC)    a. s/p Cath x 3 - reportedly nl cors. Last cath 2019 in [place]; b. a. 03/2018 Echo: EF 25-30%, diff [place].  04/2018 Persistent atrial fibrillation (HCC)    a. 03/2018 s/p DCCV; b. CHA2DS2VASc = 1-->Xarelto (later changed to warfarin); c. 05/2018 recurrent afib-->Amio initiated.  . Sleep apnea     Medications:  Scheduled:  .  allopurinol  100 mg Oral Daily  . atorvastatin  40 mg Oral Daily  . DULoxetine  20 mg Oral Daily  . enoxaparin (LOVENOX) injection  150 mg Subcutaneous Q12H  . ferrous sulfate  325 mg Oral BID WC  . furosemide  80 mg Intravenous Q12H  . gabapentin  300 mg Oral TID  . ipratropium  0.5 mg Nebulization TID  . levothyroxine  50 mcg Oral QAC breakfast  . lisinopril  2.5 mg Oral Daily  . metoprolol succinate  50 mg Oral Daily  . predniSONE  50 mg Oral Q breakfast  . sulfamethoxazole-trimethoprim  1 tablet Oral Q12H  . Warfarin - Pharmacist Dosing Inpatient   Does not apply q1600   Infusions:  . sodium chloride Stopped (06/25/19 1207)    Assessment: 59 yo male on Warfarin PTA for DVT/PE, Afib.  -Patient states that he ran out of his Coumadin, did not take Coumadin for 5 days. He states he takes 10 mg (#2 tabs of 5 mg) daily. Per cards note in Care Everywhere: Mr. Detloff does not follow-up consistently for warfarin monitoring,, had PE while on NOAC so  switch to warfarin.   DDIs: Bactrim DS started 5/10  DATE   INR   DOSE 5/07   1.2   10mg   5/08   1.3            8mg   5/09         1.4         10mg  5/10       1.9            5mg  5/11   2.1    5 mg  Goal of Therapy:  INR 2-3 Heparin level 0.3-0.7 units/ml Monitor platelets by anticoagulation protocol: Yes   Plan:  Enoxaparin:   Continue enoxaparin 150 mg BID (max dose)  WARFARIN:   INR is therapeutic: repeat warfarin 5 mg X 1 dose tonight  This dose is 50 % less than home dose due to new DDI  F/U INR with AM labs. CBC daily. Will need to be on enoxaparin bridge for at least 5 days and until INR > 2 for 24 hours  Discharge recommendation: Warfarin 5 mg daily due to DDI with bactrim plan a clinic follow up in  2-3 days from discharge.  Dallie Piles, PharmD, BCPS Clinical Pharmacist 06/26/2019 7:18 AM

## 2019-06-27 ENCOUNTER — Telehealth: Payer: Self-pay

## 2019-06-27 LAB — CULTURE, BLOOD (ROUTINE X 2)
Culture: NO GROWTH
Culture: NO GROWTH
Special Requests: ADEQUATE
Special Requests: ADEQUATE

## 2019-06-27 NOTE — Progress Notes (Signed)
No growth in hospital lab cultures x 5 days.

## 2019-06-27 NOTE — Telephone Encounter (Signed)
Patient was recently discharged from the hospital on 06/26/19.  No TCM completed, patient does not qualify for TCM services due to [facility] insurance coverage.  Per discharge summary patient needs follow up with PCP. HFU scheduled for 07/03/19 @ 8:00 AM.

## 2019-06-28 ENCOUNTER — Encounter: Payer: Medicaid Other | Attending: Internal Medicine | Admitting: Internal Medicine

## 2019-06-28 ENCOUNTER — Telehealth (HOSPITAL_COMMUNITY): Payer: Self-pay

## 2019-06-28 ENCOUNTER — Other Ambulatory Visit: Payer: Self-pay

## 2019-06-28 DIAGNOSIS — I252 Old myocardial infarction: Secondary | ICD-10-CM | POA: Diagnosis not present

## 2019-06-28 DIAGNOSIS — I509 Heart failure, unspecified: Secondary | ICD-10-CM | POA: Diagnosis not present

## 2019-06-28 DIAGNOSIS — I872 Venous insufficiency (chronic) (peripheral): Secondary | ICD-10-CM | POA: Diagnosis not present

## 2019-06-28 DIAGNOSIS — I110 Hypertensive heart disease with heart failure: Secondary | ICD-10-CM | POA: Insufficient documentation

## 2019-06-28 DIAGNOSIS — E11622 Type 2 diabetes mellitus with other skin ulcer: Secondary | ICD-10-CM | POA: Diagnosis not present

## 2019-06-28 DIAGNOSIS — I890 Lymphedema, not elsewhere classified: Secondary | ICD-10-CM | POA: Insufficient documentation

## 2019-06-28 DIAGNOSIS — L97822 Non-pressure chronic ulcer of other part of left lower leg with fat layer exposed: Secondary | ICD-10-CM | POA: Diagnosis not present

## 2019-06-28 DIAGNOSIS — J449 Chronic obstructive pulmonary disease, unspecified: Secondary | ICD-10-CM | POA: Diagnosis not present

## 2019-06-28 DIAGNOSIS — L97812 Non-pressure chronic ulcer of other part of right lower leg with fat layer exposed: Secondary | ICD-10-CM | POA: Diagnosis not present

## 2019-06-28 NOTE — Telephone Encounter (Signed)
Contacted [name] [name], he states doing ok.  He states tired and trying to catch up on some sleep.  Made an appt from home visit on Monday 17.  Will visit for Heart Failure.   [name] Boscobel EMT-Paramedic [phone]

## 2019-06-29 NOTE — Progress Notes (Signed)
Epic hard stopping provider from [employer] social worker [employer] note.   I do cosign and agree with note of the above Child psychotherapist in care of this complex and historically noncompliant patient.   [name]. Aleighna Wojtas FNP-C  [facility]

## 2019-06-29 NOTE — Progress Notes (Signed)
PRENTICE, SACKRIDER ([id number]) Visit Report for 06/28/2019 Debridement Details Patient Name: [name] [name], [name] [name] Date of Service: 06/28/2019 1:45 PM Medical Record Number: [id number] Patient Account Number: [id number] Date of Birth/Sex: [birth year] (59 y.o. M) Treating RN: Army Melia Primary Care Provider: Laverna Peace Other Clinician: Referring Provider: Laverna Peace Treating Provider/Extender: Tito Dine in Treatment: 7 Debridement Performed for Wound #5 Left,Lateral Lower Leg Assessment: Performed By: Physician Ricard Dillon, MD Debridement Type: Debridement Level of Consciousness (Pre- Awake and Alert procedure): Pre-procedure Verification/Time Out Yes - 14:36 Taken: Start Time: 14:37 Total Area Debrided (L x W): 4.7 (cm) x 9.5 (cm) = 44.65 (cm) Tissue and other material Viable, Non-Viable, Slough, Subcutaneous, Slough debrided: Level: Skin/Subcutaneous Tissue Debridement Description: Excisional Instrument: Curette Bleeding: Minimum Hemostasis Achieved: Pressure End Time: 14:39 Response to Treatment: Procedure was tolerated well Level of Consciousness (Post- Awake and Alert procedure): Post Debridement Measurements of Total Wound Length: (cm) 4.7 Width: (cm) 9.5 Depth: (cm) 0.2 Volume: (cm) 7.014 Character of Wound/Ulcer Post Debridement: Stable Post Procedure Diagnosis Same as Pre-procedure Electronic Signature(s) Signed: 06/28/2019 4:45:22 PM By: Army Melia Signed: 06/28/2019 5:28:47 PM By: Linton Ham MD Entered By: Linton Ham on 06/28/2019 14:47:01 [name] [name] ([id number]) -------------------------------------------------------------------------------- Debridement Details Patient Name: [name] [name] Date of Service: 06/28/2019 1:45 PM Medical Record Number: [id number] Patient Account Number: [id number] Date of Birth/Sex: [birth year] (59 y.o. M) Treating RN: Army Melia Primary Care Provider: Laverna Peace Other  Clinician: Referring Provider: Laverna Peace Treating Provider/Extender: Tito Dine in Treatment: 7 Debridement Performed for Wound #7 Right,Anterior Lower Leg Assessment: Performed By: Physician Ricard Dillon, MD Debridement Type: Debridement Severity of Tissue Pre Debridement: Fat layer exposed Level of Consciousness (Pre- Awake and Alert procedure): Pre-procedure Verification/Time Out Yes - 14:36 Taken: Start Time: 14:37 Total Area Debrided (L x W): 3.8 (cm) x 7.1 (cm) = 26.98 (cm) Tissue and other material Viable, Non-Viable, Slough, Subcutaneous, Slough debrided: Level: Skin/Subcutaneous Tissue Debridement Description: Excisional Instrument: Curette Bleeding: Minimum Hemostasis Achieved: Pressure End Time: 14:39 Response to Treatment: Procedure was tolerated well Level of Consciousness (Post- Awake and Alert procedure): Post Debridement Measurements of Total Wound Length: (cm) 3.8 Width: (cm) 7.1 Depth: (cm) 0.2 Volume: (cm) 4.238 Character of Wound/Ulcer Post Debridement: Stable Severity of Tissue Post Debridement: Fat layer exposed Post Procedure Diagnosis Same as Pre-procedure Electronic Signature(s) Signed: 06/28/2019 4:45:22 PM By: Army Melia Signed: 06/28/2019 5:28:47 PM By: Linton Ham MD Entered By: Linton Ham on 06/28/2019 14:47:14 [name] [name] ([id number]) -------------------------------------------------------------------------------- HPI Details Patient Name: [name] [name] Date of Service: 06/28/2019 1:45 PM Medical Record Number: [id number] Patient Account Number: [id number] Date of Birth/Sex: [birth year] (59 y.o. M) Treating RN: Army Melia Primary Care Provider: Laverna Peace Other Clinician: Referring Provider: Laverna Peace Treating Provider/Extender: Tito Dine in Treatment: 7 History of Present Illness HPI Description: 01/18/2019 on evaluation today patient presents for initial  evaluation here in our clinic concerning issues he is been having with his bilateral lower extremities. He appears based on what I am seeing to have bilateral lower extremity cellulitis along with chronic venous stasis. He has significant pitting edema even in the bottom of his foot. With that being said he also has a history of hypertension, COPD, congestive heart failure, long-term use of anticoagulants [name] [name] to atrial fibrillation which is paroxysmal as well as having had a pulmonary embolism in the past. With that being said he tells me that he has constant drainage out of his left lower extremity. This is likely [name] [name] to the  fact that again he has significant swelling pretty much all the time. With that being said in the past 2 weeks this has been getting worse and he tells me at this point that he feels like it is infected is also hurting a lot more than it has been in the past and the drainage is a different color. The left side is definitely worse than the right. Fortunately there is no signs of active infection systemically although I am concerned [name] [name] to the severity of the infection that I see in the left lower extremity especially. There is a lot of necrotic tissue on the surface of both wounds that at some point is going to need to be cleaned off to allow these to heal but right now obviously this is much too tender for him. No fevers, chills, nausea, vomiting, or diarrhea. 02/02/2019 on evaluation today patient presents for follow-up concerning the wounds over his bilateral lower extremities. He is continuing to manage. He was in the hospital and has been discharged at this point. He was admitted from 01/21/2019 through 01/27/2019. This was [name] [name] to cellulitis of lower extremity. Nonetheless he does seem to be doing much better at this point. I did review his discharge summary as well he did not appear to have any positive findings on blood cultures. His white blood cell count was never really  elevated either which is also good news. He was treated with IV clindamycin for 4 days and then switch to complete a 10-day course at home which she is still working on at this point. They did have him on IV Lasix in the hospital which helped him diurese well on discharge they stated that his weight was 408 pounds which is still higher than his dry weight of 375 to 380 pounds. Upon discharge his torsemide was increased from 40 to 60 mg twice daily and he is to follow-up with cardiology as well. 12/29-Patient returns at 2 weeks for evaluation and follow-up of his bilateral lower extremity wounds, with lymphedema, venous ulcerations, patient was recently hospitalized and was aggressively diuresed after which his torsemide has been increased. He follows up with cardiology as well. His left leg stasis wounds are more moist and have maceration of tissue, the right one is less so. We are using silver cell and unna compression given the degree of edema in the legs. Readmission: 05/04/2019 patient presents today for reevaluation here in the clinic I have not seen him since December 2020. Subsequently this is a fairly sick individual with multiple comorbidities who presents for follow-up concerning wounds of his bilateral lower extremities. He tells me that after he was in the hospital following when we saw him last in December that he ended up subsequently being discharged with home health services and they have been coming out and applying Unna boot wraps. With that being said he has used up all of his home health visits for the year and then for the last week has not had anything on his legs as far as the wrap is concerned. Obviously this is not good and he does come in today with increased swelling or at least what I perceived to be along with somewhat dry wounds again he does not seem to be weeping too much at this point which is at least good news but he has significant lymphedema/venous stasis. No  fevers, chills, nausea, vomiting, or diarrhea. 06/01/2019 upon evaluation today patient is seen after not having been seen actually since 19 2021. He  kept coming for appointment with every time he would come his temperature would be elevated past the point where we could see him. I told him that he needed to go be seen at the ER during 1 of these times [name] [name] to the fact that obviously this was a fever of unknown origin at that point and my concern was that it could potentially be his legs but again I was not entirely sure as obviously I could not see them [name] [name] to the restrictions secondary to Covid even bringing the patient into the clinic. Nonetheless he finally came in today his temperature again was elevated but not to the same degree that it met the 100.4 or above guidelines of having to cancel the appointment. Either way we did have a look at his legs unfortunately he appears to show signs of cellulitis at this point which is probably where the temperature elevations are emanating from. He has significant lymphedema as well as chronic vent venous stasis as well. When I initially told him that is why I told him to go to the ER previously when his temperature was elevated in case it was something with wounds or otherwise he replied "nobody ever told me to go to the ER". I then subsequently reminded him of me coming to the front desk [name] [name] to his temperature being elevated and advising him to go to the ER to which she replied "I am not going to the ER and I do not think I need to even with this". Nonetheless I am concerned about the infection of his legs at this point he has significant weeping, significant edema, and overall is not doing very well in my opinion. 4/23; the patient arrives in clinic today having napkin stuck on his wounds. He is taking the antibiotic from last week. Last Wednesday he had his INR checked at the Coumadin clinic it was apparently "sky high" and his Coumadin is now on hold. He  is going back to check this again on Monday. His legs were not put back in compression last week out of fear of the extending infection with fever. He has been taking the antibiotics. Culture from last week showed Enterobacter and methicillin resistant staph aureus. He is on on doxycycline which the MRSA was sensitive to Enterobacter was not plated but given the pattern of sensitivities probably the doxycycline was satisfactory. I will extend the doxycycline another 4 days until he is seen next week. From an infection point of view this looks better 06/14/2019 upon evaluation today patient unfortunately continues to have signs of cellulitis on the lower extremities bilaterally especially on the left. Unfortunately he doesn't seem to be doing much better despite being on doxycycline for a couple of weeks. He was positive for MRSA as well as Enterobacter. Obviously the MRSA is treated by way of the doxycycline the Enterobacter unfortunately is not I'm going to see about initiating Bactrim for him at this point. 4/13; since he was last here he was hospitalized from 5/7 through 5/11. Mostly this was for congestive heart failure. He has an ejection fraction of 25 to 30%. His torsemide dose was increased. While he was there they dealt with the infection in the right leg. He did not take his Bactrim that he was prescribed last time. He was treated with linezolid in the hospital and cefepime and discharged on Bactrim to complete a 2-week course. He was supposed to follow-up with the Coumadin clinic at his cardiologist's office [Dr. End). He has  not done this. He tells me he is on Coumadino 5 mg twice a day. The last INR I see is from 5/11 when he was discharged from the hospital it was 2.1 however it was increasing every day they check this. He was thought to have cellulitis from methicillin-resistant staph aureus. [name] [name], [name] [name] ([id number]) Electronic Signature(s) Signed: 06/28/2019 5:28:47 PM By: Linton Ham MD Entered By: Linton Ham on 06/28/2019 14:52:54 [name] [name], [name] [name] ([id number]) -------------------------------------------------------------------------------- Physical Exam Details Patient Name: [name] [name] Date of Service: 06/28/2019 1:45 PM Medical Record Number: [id number] Patient Account Number: [id number] Date of Birth/Sex: Oct 27, 1960 (59 y.o. M) Treating RN: Army Melia Primary Care Provider: Laverna Peace Other Clinician: Referring Provider: Laverna Peace Treating Provider/Extender: Tito Dine in Treatment: 7 Constitutional Sitting or standing Blood Pressure is within target range for patient.. Pulse regular and within target range for patient.[name] Respirations regular, non- labored and within target range.. Temperature is normal and within the target range for the patient.[name] appears in no distress. Cardiovascular Dorsalis pedis pulses are palpable bilaterally.. Severe bilateral lymphedema. Legs are in the shape of an inverted bottle i.e. inverted bottle sign.. Integumentary (Hair, Skin) There is no evidence of significant infection. Notes Wound exam; severe bilateral lymphedema with chronic skin changes. His wounds are in the bilateral anterior lower legs. Completely nonviable surface is bilaterally. I used a #5 curette to remove as much as we could requiring pressure dressings for hemostasis. Fortunately he does not seem to have as much obvious infection in either lower leg. Electronic Signature(s) Signed: 06/28/2019 5:28:47 PM By: Linton Ham MD Entered By: Linton Ham on 06/28/2019 14:55:24 [name] [name] ([id number]) -------------------------------------------------------------------------------- Physician Orders Details Patient Name: [name] [name] Date of Service: 06/28/2019 1:45 PM Medical Record Number: [id number] Patient Account Number: [id number] Date of Birth/Sex: [birth year] (59 y.o. M) Treating RN: Army Melia Primary  Care Provider: Laverna Peace Other Clinician: Referring Provider: Laverna Peace Treating Provider/Extender: Tito Dine in Treatment: 7 Verbal / Phone Orders: No Diagnosis Coding ICD-10 Coding Code Description I87.2 Venous insufficiency (chronic) (peripheral) I89.0 Lymphedema, not elsewhere classified L97.812 Non-pressure chronic ulcer of other part of right lower leg with fat layer exposed L97.822 Non-pressure chronic ulcer of other part of left lower leg with fat layer exposed [name] [name] (primary) hypertension J44.9 Chronic obstructive pulmonary disease, unspecified Z79.01 Long term (current) use of anticoagulants I48.0 Paroxysmal atrial fibrillation Z86.711 Personal history of pulmonary embolism I50.42 Chronic combined systolic (congestive) and diastolic (congestive) heart failure Wound Cleansing Wound #3 Right,Proximal,Lateral Lower Leg o Cleanse wound with mild soap and water o May shower with protection. Wound #5 Left,Lateral Lower Leg o Cleanse wound with mild soap and water o May shower with protection. Wound #7 Right,Anterior Lower Leg o Cleanse wound with mild soap and water o May shower with protection. Primary Wound Dressing Wound #3 Right,Proximal,Lateral Lower Leg o Iodoflex Wound #5 Left,Lateral Lower Leg o Iodoflex Wound #7 Right,Anterior Lower Leg o Iodoflex Secondary Dressing Wound #3 Right,Proximal,Lateral Lower Leg o XtraSorb - where needed Wound #5 Left,Lateral Lower Leg o XtraSorb - where needed Wound #7 Right,Anterior Lower Leg o XtraSorb - where needed Dressing Change Frequency Wound #3 Right,Proximal,Lateral Lower Leg o Other: - twice weekly Wound #5 Left,Lateral Lower Leg o Other: - twice weekly [name] [name], [name] [name] ([id number]) Wound #7 Right,Anterior Lower Leg o Other: - twice weekly Follow-up Appointments Wound #3 Right,Proximal,Lateral Lower Leg o Return Appointment in 1 week. o  Nurse Visit as needed Wound #5 Left,Lateral Lower Leg o Return Appointment in 1  week. o Nurse Visit as needed Wound #7 Right,Anterior Lower Leg o Return Appointment in 1 week. o Nurse Visit as needed Edema Control Wound #3 Right,Proximal,Lateral Lower Leg o Unna Boots Bilaterally o Elevate legs to the level of the heart and pump ankles as often as possible Wound #5 Left,Lateral Lower Leg o Unna Boots Bilaterally o Elevate legs to the level of the heart and pump ankles as often as possible Wound #7 Right,Anterior Lower Leg o Unna Boots Bilaterally o Elevate legs to the level of the heart and pump ankles as often as possible Electronic Signature(s) Signed: 06/28/2019 4:45:22 PM By: Army Melia Signed: 06/28/2019 5:28:47 PM By: Linton Ham MD Entered By: Army Melia on 06/28/2019 14:46:09 [name] [name] ([id number]) -------------------------------------------------------------------------------- Problem List Details Patient Name: [name] [name] Date of Service: 06/28/2019 1:45 PM Medical Record Number: [id number] Patient Account Number: [id number] Date of Birth/Sex: [birth year] (58 y.o. M) Treating RN: Army Melia Primary Care Provider: Laverna Peace Other Clinician: Referring Provider: Laverna Peace Treating Provider/Extender: Tito Dine in Treatment: 7 Active Problems ICD-10 Encounter Code Description Active Date MDM Diagnosis I87.2 Venous insufficiency (chronic) (peripheral) 05/04/2019 No Yes I89.0 Lymphedema, not elsewhere classified 05/04/2019 No Yes L97.812 Non-pressure chronic ulcer of other part of right lower leg with fat layer 05/04/2019 No Yes exposed L97.822 Non-pressure chronic ulcer of other part of left lower leg with fat layer 05/04/2019 No Yes exposed [place] (primary) hypertension 05/04/2019 No Yes J44.9 Chronic obstructive pulmonary disease, unspecified 05/04/2019 No Yes Z79.01 Long term (current) use of  anticoagulants 05/04/2019 No Yes I48.0 Paroxysmal atrial fibrillation 05/04/2019 No Yes Z86.711 Personal history of pulmonary embolism 05/04/2019 No Yes I50.42 Chronic combined systolic (congestive) and diastolic (congestive) heart 05/04/2019 No Yes failure Inactive Problems Resolved Problems Electronic Signature(s) Signed: 06/28/2019 5:28:47 PM By: Linton Ham MD [place] ([id number]) Entered By: Linton Ham on 06/28/2019 14:44:38 [name] [name] ([id number]) -------------------------------------------------------------------------------- Progress Note Details Patient Name: [name] [name] Date of Service: 06/28/2019 1:45 PM Medical Record Number: [id number] Patient Account Number: [id number] Date of Birth/Sex: 02/07/1961 (59 y.o. M) Treating RN: Army Melia Primary Care Provider: Laverna Peace Other Clinician: Referring Provider: Laverna Peace Treating Provider/Extender: Tito Dine in Treatment: 7 Subjective History of Present Illness (HPI) 01/18/2019 on evaluation today patient presents for initial evaluation here in our clinic concerning issues he is been having with his bilateral lower extremities. He appears based on what I am seeing to have bilateral lower extremity cellulitis along with chronic venous stasis. He has significant pitting edema even in the bottom of his foot. With that being said he also has a history of hypertension, COPD, congestive heart failure, long-term use of anticoagulants [name] [name] to atrial fibrillation which is paroxysmal as well as having had a pulmonary embolism in the past. With that being said he tells me that he has constant drainage out of his left lower extremity. This is likely [name] [name] to the fact that again he has significant swelling pretty much all the time. With that being said in the past 2 weeks this has been getting worse and he tells me at this point that he feels like it is infected is also hurting a lot more than  it has been in the past and the drainage is a different color. The left side is definitely worse than the right. Fortunately there is no signs of active infection systemically although I am concerned [name] [name] to the severity of the infection that I see in the left lower extremity especially. There is  a lot of necrotic tissue on the surface of both wounds that at some point is going to need to be cleaned off to allow these to heal but right now obviously this is much too tender for him. No fevers, chills, nausea, vomiting, or diarrhea. 02/02/2019 on evaluation today patient presents for follow-up concerning the wounds over his bilateral lower extremities. He is continuing to manage. He was in the hospital and has been discharged at this point. He was admitted from 01/21/2019 through 01/27/2019. This was [name] [name] to cellulitis of lower extremity. Nonetheless he does seem to be doing much better at this point. I did review his discharge summary as well he did not appear to have any positive findings on blood cultures. His white blood cell count was never really elevated either which is also good news. He was treated with IV clindamycin for 4 days and then switch to complete a 10-day course at home which she is still working on at this point. They did have him on IV Lasix in the hospital which helped him diurese well on discharge they stated that his weight was 408 pounds which is still higher than his dry weight of 375 to 380 pounds. Upon discharge his torsemide was increased from 40 to 60 mg twice daily and he is to follow-up with cardiology as well. 12/29-Patient returns at 2 weeks for evaluation and follow-up of his bilateral lower extremity wounds, with lymphedema, venous ulcerations, patient was recently hospitalized and was aggressively diuresed after which his torsemide has been increased. He follows up with cardiology as well. His left leg stasis wounds are more moist and have maceration of tissue, the right  one is less so. We are using silver cell and unna compression given the degree of edema in the legs. Readmission: 05/04/2019 patient presents today for reevaluation here in the clinic I have not seen him since December 2020. Subsequently this is a fairly sick individual with multiple comorbidities who presents for follow-up concerning wounds of his bilateral lower extremities. He tells me that after he was in the hospital following when we saw him last in December that he ended up subsequently being discharged with home health services and they have been coming out and applying Unna boot wraps. With that being said he has used up all of his home health visits for the year and then for the last week has not had anything on his legs as far as the wrap is concerned. Obviously this is not good and he does come in today with increased swelling or at least what I perceived to be along with somewhat dry wounds again he does not seem to be weeping too much at this point which is at least good news but he has significant lymphedema/venous stasis. No fevers, chills, nausea, vomiting, or diarrhea. 06/01/2019 upon evaluation today patient is seen after not having been seen actually since 19 2021. He kept coming for appointment with every time he would come his temperature would be elevated past the point where we could see him. I told him that he needed to go be seen at the ER during 1 of these times [name] [name] to the fact that obviously this was a fever of unknown origin at that point and my concern was that it could potentially be his legs but again I was not entirely sure as obviously I could not see them [name] [name] to the restrictions secondary to Covid even bringing the patient into the clinic. Nonetheless he finally came in  today his temperature again was elevated but not to the same degree that it met the 100.4 or above guidelines of having to cancel the appointment. Either way we did have a look at his legs unfortunately  he appears to show signs of cellulitis at this point which is probably where the temperature elevations are emanating from. He has significant lymphedema as well as chronic vent venous stasis as well. When I initially told him that is why I told him to go to the ER previously when his temperature was elevated in case it was something with wounds or otherwise he replied "nobody ever told me to go to the ER". I then subsequently reminded him of me coming to the front desk [name] [name] to his temperature being elevated and advising him to go to the ER to which she replied "I am not going to the ER and I do not think I need to even with this". Nonetheless I am concerned about the infection of his legs at this point he has significant weeping, significant edema, and overall is not doing very well in my opinion. 4/23; the patient arrives in clinic today having napkin stuck on his wounds. He is taking the antibiotic from last week. Last Wednesday he had his INR checked at the Coumadin clinic it was apparently "sky high" and his Coumadin is now on hold. He is going back to check this again on Monday. His legs were not put back in compression last week out of fear of the extending infection with fever. He has been taking the antibiotics. Culture from last week showed Enterobacter and methicillin resistant staph aureus. He is on on doxycycline which the MRSA was sensitive to Enterobacter was not plated but given the pattern of sensitivities probably the doxycycline was satisfactory. I will extend the doxycycline another 4 days until he is seen next week. From an infection point of view this looks better 06/14/2019 upon evaluation today patient unfortunately continues to have signs of cellulitis on the lower extremities bilaterally especially on the left. Unfortunately he doesn't seem to be doing much better despite being on doxycycline for a couple of weeks. He was positive for MRSA as well as Enterobacter. Obviously the  MRSA is treated by way of the doxycycline the Enterobacter unfortunately is not I'm going to see about initiating Bactrim for him at this point. 4/13; since he was last here he was hospitalized from 5/7 through 5/11. Mostly this was for congestive heart failure. He has an ejection fraction of 25 to 30%. His torsemide dose was increased. While he was there they dealt with the infection in the right leg. He did not take his Bactrim that he was prescribed last time. He was treated with linezolid in the hospital and cefepime and discharged on Bactrim to complete a 2-week course. He was supposed to follow-up with the Coumadin clinic at his cardiologist's office [Dr. End). He has not done this. He tells me he is on Coumadino 5 mg twice a day. The last INR I see is from 5/11 when he was discharged from the hospital it was 2.1 however it was increasing every day they check this. He was thought to have cellulitis from methicillin-resistant staph aureus. [name] [name], [name] [name] ([id number]) Objective Constitutional Sitting or standing Blood Pressure is within target range for patient.. Pulse regular and within target range for patient.[name] Respirations regular, non- labored and within target range.. Temperature is normal and within the target range for the patient.[name] appears in no distress. Vitals  Time Taken: 1:40 PM, Height: 68 in, Weight: 400 lbs, BMI: 60.8, Temperature: 99.5 F, Pulse: 85 bpm, Respiratory Rate: 20 breaths/min, Blood Pressure: 128/68 mmHg. Cardiovascular Dorsalis pedis pulses are palpable bilaterally.. Severe bilateral lymphedema. Legs are in the shape of an inverted bottle i.e. inverted bottle sign.. General Notes: Wound exam; severe bilateral lymphedema with chronic skin changes. His wounds are in the bilateral anterior lower legs. Completely nonviable surface is bilaterally. I used a #5 curette to remove as much as we could requiring pressure dressings for hemostasis. Fortunately he does not seem  to have as much obvious infection in either lower leg. Integumentary (Hair, Skin) There is no evidence of significant infection. Wound #3 status is Open. Original cause of wound was Gradually Appeared. The wound is located on the Right,Proximal,Lateral Lower Leg. The wound measures 0.4cm length x 0.3cm width x 0.1cm depth; 0.094cm^2 area and 0.009cm^3 volume. There is Fat Layer (Subcutaneous Tissue) Exposed exposed. There is a medium amount of serous drainage noted. The wound margin is flat and intact. There is medium (34-66%) pink granulation within the wound bed. There is a medium (34-66%) amount of necrotic tissue within the wound bed including Adherent Slough. Wound #5 status is Open. Original cause of wound was Gradually Appeared. The wound is located on the Left,Lateral Lower Leg. The wound measures 4.7cm length x 9.5cm width x 0.2cm depth; 35.068cm^2 area and 7.014cm^3 volume. There is Fat Layer (Subcutaneous Tissue) Exposed exposed. There is a medium amount of serous drainage noted. The wound margin is flat and intact. There is medium (34-66%) pink granulation within the wound bed. There is a medium (34-66%) amount of necrotic tissue within the wound bed including Adherent Slough. Wound #7 status is Open. Original cause of wound was Gradually Appeared. The wound is located on the Right,Anterior Lower Leg. The wound measures 3.8cm length x 7.1cm width x 0.2cm depth; 21.19cm^2 area and 4.238cm^3 volume. There is Fat Layer (Subcutaneous Tissue) Exposed exposed. There is a medium amount of serosanguineous drainage noted. The wound margin is flat and intact. There is small (1-33%) pink granulation within the wound bed. There is a large (67-100%) amount of necrotic tissue within the wound bed including Adherent Slough. Assessment Active Problems ICD-10 Venous insufficiency (chronic) (peripheral) Lymphedema, not elsewhere classified Non-pressure chronic ulcer of other part of right lower leg  with fat layer exposed Non-pressure chronic ulcer of other part of left lower leg with fat layer exposed Essential (primary) hypertension Chronic obstructive pulmonary disease, unspecified Long term (current) use of anticoagulants Paroxysmal atrial fibrillation Personal history of pulmonary embolism Chronic combined systolic (congestive) and diastolic (congestive) heart failure Procedures Wound #5 Pre-procedure diagnosis of Wound #5 is a Lymphedema located on the Left,Lateral Lower Leg . There was a Excisional Skin/Subcutaneous Tissue Debridement with a total area of 44.65 sq cm performed by Ricard Dillon, MD. With the following instrument(s): Curette to remove Viable and Non-Viable tissue/material. Material removed includes Subcutaneous Tissue and Slough and. A time out was conducted at 14:36, prior to the EDY, BELT ([id number]) start of the procedure. A Minimum amount of bleeding was controlled with Pressure. The procedure was tolerated well. Post Debridement Measurements: 4.7cm length x 9.5cm width x 0.2cm depth; 7.014cm^3 volume. Character of Wound/Ulcer Post Debridement is stable. Post procedure Diagnosis Wound #5: Same as Pre-Procedure Pre-procedure diagnosis of Wound #5 is a Lymphedema located on the Left,Lateral Lower Leg . There was a Haematologist Compression Therapy Procedure by Army Melia, RN. Post procedure Diagnosis Wound #5: Same  as Pre-Procedure Wound #7 Pre-procedure diagnosis of Wound #7 is a Venous Leg Ulcer located on the Right,Anterior Lower Leg .Severity of Tissue Pre Debridement is: Fat layer exposed. There was a Excisional Skin/Subcutaneous Tissue Debridement with a total area of 26.98 sq cm performed by Ricard Dillon, MD. With the following instrument(s): Curette to remove Viable and Non-Viable tissue/material. Material removed includes Subcutaneous Tissue and Slough and. A time out was conducted at 14:36, prior to the start of the procedure. A Minimum  amount of bleeding was controlled with Pressure. The procedure was tolerated well. Post Debridement Measurements: 3.8cm length x 7.1cm width x 0.2cm depth; 4.238cm^3 volume. Character of Wound/Ulcer Post Debridement is stable. Severity of Tissue Post Debridement is: Fat layer exposed. Post procedure Diagnosis Wound #7: Same as Pre-Procedure Pre-procedure diagnosis of Wound #7 is a Venous Leg Ulcer located on the Right,Anterior Lower Leg . There was a Haematologist Compression Therapy Procedure by Army Melia, RN. Post procedure Diagnosis Wound #7: Same as Pre-Procedure Wound #3 Pre-procedure diagnosis of Wound #3 is a Diabetic Wound/Ulcer of the Lower Extremity located on the Right,Proximal,Lateral Lower Leg . There was a Haematologist Compression Therapy Procedure by Army Melia, RN. Post procedure Diagnosis Wound #3: Same as Pre-Procedure Plan Wound Cleansing: Wound #3 Right,Proximal,Lateral Lower Leg: Cleanse wound with mild soap and water May shower with protection. Wound #5 Left,Lateral Lower Leg: Cleanse wound with mild soap and water May shower with protection. Wound #7 Right,Anterior Lower Leg: Cleanse wound with mild soap and water May shower with protection. Primary Wound Dressing: Wound #3 Right,Proximal,Lateral Lower Leg: Iodoflex Wound #5 Left,Lateral Lower Leg: Iodoflex Wound #7 Right,Anterior Lower Leg: Iodoflex Secondary Dressing: Wound #3 Right,Proximal,Lateral Lower Leg: XtraSorb - where needed Wound #5 Left,Lateral Lower Leg: XtraSorb - where needed Wound #7 Right,Anterior Lower Leg: XtraSorb - where needed Dressing Change Frequency: Wound #3 Right,Proximal,Lateral Lower Leg: Other: - twice weekly Wound #5 Left,Lateral Lower Leg: Other: - twice weekly Wound #7 Right,Anterior Lower Leg: Other: - twice weekly Follow-up Appointments: Wound #3 Right,Proximal,Lateral Lower Leg: Return Appointment in 1 week. Nurse Visit as needed Wound #5 Left,Lateral Lower  Leg: Return Appointment in 1 week. Nurse Visit as needed Wound #7 Right,Anterior Lower Leg: Return Appointment in 1 week. WILLEY, [name] [name] ([id number]) Nurse Visit as needed Edema Control: Wound #3 Right,Proximal,Lateral Lower Leg: Unna Boots Bilaterally Elevate legs to the level of the heart and pump ankles as often as possible Wound #5 Left,Lateral Lower Leg: Unna Boots Bilaterally Elevate legs to the level of the heart and pump ankles as often as possible Wound #7 Right,Anterior Lower Leg: Unna Boots Bilaterally Elevate legs to the level of the heart and pump ankles as often as possible 1. From an infection point of view his leg looks better. He is on Septra DS for a 10-day course. 2. We phoned to his cardiologist office to see if we can get him a 3-day appointment to get his INR checked but they cannot see him until Monday. I explained the importance of this to the patient 3. His wound surface is going to require further debridement we put him on Iodoflex under an Haematologist. Truthfully he may need more compression than this. His lymphedema today is completely uncontrolled. I doubt his wounds will heal like this. 4. He has external compression pumps but states he cannot get these on and he lives alone. 5. He will follow up with [place] next week. Electronic Signature(s) Signed: 06/28/2019 5:28:47 PM By: Linton Ham MD Entered  By: Linton Ham on 06/28/2019 15:01:25 [name] [name] ([id number]) -------------------------------------------------------------------------------- SuperBill Details Patient Name: [name] [name] Date of Service: 06/28/2019 Medical Record Number: [id number] Patient Account Number: [id number] Date of Birth/Sex: 1960/04/25 (59 y.o. M) Treating RN: Army Melia Primary Care Provider: Laverna Peace Other Clinician: Referring Provider: Laverna Peace Treating Provider/Extender: Tito Dine in Treatment: 7 Diagnosis Coding ICD-10  Codes Code Description I87.2 Venous insufficiency (chronic) (peripheral) I89.0 Lymphedema, not elsewhere classified L97.812 Non-pressure chronic ulcer of other part of right lower leg with fat layer exposed L97.822 Non-pressure chronic ulcer of other part of left lower leg with fat layer exposed [place] (primary) hypertension J44.9 Chronic obstructive pulmonary disease, unspecified Z79.01 Long term (current) use of anticoagulants I48.0 Paroxysmal atrial fibrillation Z86.711 Personal history of pulmonary embolism I50.42 Chronic combined systolic (congestive) and diastolic (congestive) heart failure Facility Procedures CPT4 Code: 25189842 Description: 10312 - DEB SUBQ TISSUE 20 SQ CM/< Modifier: Quantity: 1 CPT4 Code: Description: ICD-10 Diagnosis Description O11.886 Non-pressure chronic ulcer of other part of right lower leg with fat lay L97.822 Non-pressure chronic ulcer of other part of left lower leg with fat laye Modifier: er exposed r exposed Quantity: CPT4 Code: 77373668 Description: [place] - DEB SUBQ TISS EA ADDL 20CM Modifier: Quantity: 3 CPT4 Code: Description: ICD-10 Diagnosis Description D59.470 Non-pressure chronic ulcer of other part of right lower leg with fat lay L97.822 Non-pressure chronic ulcer of other part of left lower leg with fat laye Modifier: er exposed r exposed Quantity: Physician Procedures CPT4 Code: 7615183 Description: 11042 - WC PHYS SUBQ TISS 20 SQ CM Modifier: Quantity: 1 CPT4 Code: Description: ICD-10 Diagnosis Description U37.357 Non-pressure chronic ulcer of other part of right lower leg with fat laye L97.822 Non-pressure chronic ulcer of other part of left lower leg with fat layer Modifier: r exposed exposed Quantity: CPT4 Code: [id number] Description: 11045 - WC PHYS SUBQ TISS EA ADDL 20 CM Modifier: Quantity: 3 CPT4 Code: Description: ICD-10 Diagnosis Description S12.820 Non-pressure chronic ulcer of other part of right lower leg  with fat laye L97.822 Non-pressure chronic ulcer of other part of left lower leg with fat layer Modifier: r exposed exposed Quantity: Electronic Signature(s) Signed: 06/28/2019 5:28:47 PM By: Linton Ham MD Entered By: Linton Ham on 06/28/2019 15:01:48

## 2019-07-02 ENCOUNTER — Ambulatory Visit: Payer: Self-pay | Admitting: *Deleted

## 2019-07-02 DIAGNOSIS — J449 Chronic obstructive pulmonary disease, unspecified: Secondary | ICD-10-CM

## 2019-07-02 DIAGNOSIS — G894 Chronic pain syndrome: Secondary | ICD-10-CM

## 2019-07-02 NOTE — Progress Notes (Unsigned)
Pt was on my schedule for INR check today @ 10:15.   I received notification from the front desk that pt had arrived at 2:33.   I was with another pt when he arrived, so advised front desk that I would need to finish current appt, take that pt to [facility] (pt was in a wheelchair & needed assistance), and then see my scheduled pts, one of which was already waiting. Advised that I would be able to work pt in, but I did not have any openings until 4:00, but would try to see him sooner when I was done w/ my scheduled pts. Pt reported to the front desk that transportation was supposed to have called (unsure if they were to have called at his scheduled appt time this am or when they were on their way here this pm).  Apparently pt became upset and stated "this is ridiculous, it only takes 10 minutes", cancelled his appt and refused to reschedule.

## 2019-07-02 NOTE — Patient Instructions (Addendum)
Thank you allowing the Chronic Care Management Team to be a part of your care! It was a pleasure speaking with you today!  1. Please follow up with para-medicine and [facility] regarding follow up   CCM (Chronic Care Management) Team   Juanell Fairly RN, BSN Nurse Care Coordinator  [phone]  Zaraya Delauder [address], LCSW Clinical Social Worker [phone]  Goals Addressed            This Visit's Progress   . "I was denied more aid service, I need a aid (pt-stated)       CARE PLAN ENTRY (see longtitudinal plan of care for additional care plan information)  Current Barriers:  . ADL IADL limitations  Clinical Social Work Clinical Goal(s):  [name] Over the next 90 days, patient will work with [facility] to address needs related to be denied continued in home aid services  Interventions: . Follow up phone call from patient stating that he has been released from the hospital and needs to re-schedule appointment with para-medicine today due to a scheduling conflict-contact number provided . Patient confirmed that [facility] services through [facility] has not been to visit yet . Collaboration phone call to [employer] with [place] to follow up on referral . Patient confirmed that he called [employer] to follow up on request form for personal care services [phone] and was told that the referral form was not received . Patient states that he has an appointment with his provider on 07/04/19 and will follow up on the form at that time. . Assisted Living options discussed with patient, however patient has declined at this time. . This Child psychotherapist agreed to meet patient at the office for further assistance . Discussed plans with patient for ongoing care management follow up and provided patient with direct contact information for care management team   Patient Self Care Activities:  . Patient verbalizes understanding of plan to contact [employer] to file an appeal  for in home services . Unable to perform ADLs independently  Please see past updates related to this goal by clicking on the "Past Updates" button in the selected goal          The patient verbalized understanding of instructions provided today and declined a print copy of patient instruction materials.   Face to Face appointment with care management team member scheduled for:  07/04/19

## 2019-07-02 NOTE — Chronic Care Management (AMB) (Signed)
Care Management    Clinical Social Work Follow Up Note  07/02/2019 Name: [name] [name] MRN: [id number] DOB: [birth year]  [name] [name] is a 59 y.o. year old male who is a primary care patient of Flinchum, Kelby Aline, FNP. The CCM team was consulted for assistance with [name] .   Review of patient status, including review of consultants reports, other relevant assessments, and collaboration with appropriate care team members and the patient's provider was performed as part of comprehensive patient evaluation and provision of chronic care management services.    SDOH (Social Determinants of Health) assessments performed: No    Outpatient Encounter Medications as of 07/02/2019  Medication Sig  . albuterol (VENTOLIN HFA) 108 (90 Base) MCG/ACT inhaler Inhale 2 puffs into the lungs every 6 (six) hours as needed for wheezing or shortness of breath.  . allopurinol (ZYLOPRIM) 100 MG tablet Take 1 tablet (100 mg total) by mouth daily.  [name] ALPRAZolam (XANAX) 0.5 MG tablet Take 1 tablet (0.5 mg total) by mouth 2 (two) times daily as needed for anxiety or sleep.  . colchicine 0.6 MG tablet 1 tab PO q 12 hrs as needed until gout flare subsides, schedule on office follow up (Patient taking differently: Take 0.6 mg by mouth daily as needed. )  . DULoxetine (CYMBALTA) 20 MG capsule Take 1 capsule (20 mg total) by mouth daily.  . ferrous sulfate 325 (65 FE) MG tablet Take 1 tablet (325 mg total) by mouth 2 (two) times daily with a meal.  . gabapentin (NEURONTIN) 300 MG capsule Take 1 capsule (300 mg total) by mouth 3 (three) times daily.  [name] HYDROcodone-acetaminophen (NORCO/VICODIN) 5-325 MG tablet Take 1-2 tablets by mouth every 6 (six) hours as needed for moderate pain or severe pain.  [name] levothyroxine (SYNTHROID) 50 MCG tablet Take 1 tablet (50 mcg total) by mouth daily before breakfast. Need lab work beginning of June for Thyroid  . lisinopril (ZESTRIL) 2.5 MG tablet Take 1 tablet (2.5 mg  total) by mouth daily.  [name] loperamide (IMODIUM) 2 MG capsule Take 1 capsule (2 mg total) by mouth as needed for diarrhea or loose stools.  . metoprolol succinate (TOPROL-XL) 50 MG 24 hr tablet Take 1 tablet (50 mg total) by mouth daily. Take with or immediately following a meal.  . nitroGLYCERIN (NITROSTAT) 0.4 MG SL tablet Place 1 tablet under tongue every 5 minutes as needed for chest pain. (No more than 3 doses within 15 minutes)  . sulfamethoxazole-trimethoprim (BACTRIM DS) 800-160 MG tablet Take 1 tablet by mouth every 12 (twelve) hours for 10 days.  [name] torsemide (DEMADEX) 20 MG tablet Take 4 tablets (80 mg total) by mouth 2 (two) times daily.  . traZODone (DESYREL) 150 MG tablet TAKE 1 TABLET BY MOUTH AT BEDTIME AS NEEDED SLEEP  . warfarin (COUMADIN) 5 MG tablet Take 1 tablet (5 mg total) by mouth daily.   No facility-administered encounter medications on file as of 07/02/2019.     Goals Addressed            This Visit's Progress   . "I was denied more aid service, I need a aid (pt-stated)       CARE PLAN ENTRY (see longtitudinal plan of care for additional care plan information)  Current Barriers:  . ADL IADL limitations  Clinical Social Work Clinical Goal(s):  [name] Over the next 90 days, patient will work with [facility] to address needs related to be denied continued in home aid services  Interventions: . Follow up phone call to [place] from [facility] to confirm the start of [facility] services. . Per [place] at [facility], patient remains active with [facility] and was seen by the [facility] nurse on 06/28/19, next appointment scheduled for 07/06/19 . Patient will be seen by wound care on 07/05/19 . PT services have been reinstated and will follow up with patient next week. (Evaluation had to be re-scheduled from the weekend due to staffing) . CSW to follow up with personal care services through [facility] healthcare   Patient Self Care Activities:  . Patient verbalizes understanding of plan  to contact [employer] to file an appeal for in home services . Unable to perform ADLs independently  Please see past updates related to this goal by clicking on the "Past Updates" button in the selected goal          Follow Up Plan: SW will follow up with patient by phone over the next 7-10 days    Annagrace Carr, [place] Clinical Social Worker  [facility] Care Management [phone]

## 2019-07-02 NOTE — Chronic Care Management (AMB) (Signed)
Care Management    Clinical Social Work Follow Up Note  07/02/2019 Name: [name] [name] MRN: [id number] DOB: 1960-06-24  [name] [name] is a 59 y.o. year old male who is a primary care patient of Flinchum, Kelby Aline, FNP. The CCM team was consulted for assistance with [name] .   Review of patient status, including review of consultants reports, other relevant assessments, and collaboration with appropriate care team members and the patient's provider was performed as part of comprehensive patient evaluation and provision of chronic care management services.    SDOH (Social Determinants of Health) assessments performed: No    Outpatient Encounter Medications as of 07/02/2019  Medication Sig  . albuterol (VENTOLIN HFA) 108 (90 Base) MCG/ACT inhaler Inhale 2 puffs into the lungs every 6 (six) hours as needed for wheezing or shortness of breath.  . allopurinol (ZYLOPRIM) 100 MG tablet Take 1 tablet (100 mg total) by mouth daily.  [name] ALPRAZolam (XANAX) 0.5 MG tablet Take 1 tablet (0.5 mg total) by mouth 2 (two) times daily as needed for anxiety or sleep.  . colchicine 0.6 MG tablet 1 tab PO q 12 hrs as needed until gout flare subsides, schedule on office follow up (Patient taking differently: Take 0.6 mg by mouth daily as needed. )  . DULoxetine (CYMBALTA) 20 MG capsule Take 1 capsule (20 mg total) by mouth daily.  . ferrous sulfate 325 (65 FE) MG tablet Take 1 tablet (325 mg total) by mouth 2 (two) times daily with a meal.  . gabapentin (NEURONTIN) 300 MG capsule Take 1 capsule (300 mg total) by mouth 3 (three) times daily.  [name] HYDROcodone-acetaminophen (NORCO/VICODIN) 5-325 MG tablet Take 1-2 tablets by mouth every 6 (six) hours as needed for moderate pain or severe pain.  [name] levothyroxine (SYNTHROID) 50 MCG tablet Take 1 tablet (50 mcg total) by mouth daily before breakfast. Need lab work beginning of June for Thyroid  . lisinopril (ZESTRIL) 2.5 MG tablet Take 1 tablet (2.5 mg  total) by mouth daily.  [name] loperamide (IMODIUM) 2 MG capsule Take 1 capsule (2 mg total) by mouth as needed for diarrhea or loose stools.  . metoprolol succinate (TOPROL-XL) 50 MG 24 hr tablet Take 1 tablet (50 mg total) by mouth daily. Take with or immediately following a meal.  . nitroGLYCERIN (NITROSTAT) 0.4 MG SL tablet Place 1 tablet under tongue every 5 minutes as needed for chest pain. (No more than 3 doses within 15 minutes)  . sulfamethoxazole-trimethoprim (BACTRIM DS) 800-160 MG tablet Take 1 tablet by mouth every 12 (twelve) hours for 10 days.  [name] torsemide (DEMADEX) 20 MG tablet Take 4 tablets (80 mg total) by mouth 2 (two) times daily.  . traZODone (DESYREL) 150 MG tablet TAKE 1 TABLET BY MOUTH AT BEDTIME AS NEEDED SLEEP  . warfarin (COUMADIN) 5 MG tablet Take 1 tablet (5 mg total) by mouth daily.   No facility-administered encounter medications on file as of 07/02/2019.     Goals Addressed            This Visit's Progress   . "I was denied more aid service, I need a aid (pt-stated)       CARE PLAN ENTRY (see longtitudinal plan of care for additional care plan information)  Current Barriers:  . ADL IADL limitations  Clinical Social Work Clinical Goal(s):  [name] Over the next 90 days, patient will work with [facility] to address needs related to be denied continued in home aid services  Interventions: . Follow up phone call from patient stating that he has been released from the hospital and needs to re-schedule appointment with para-medicine today due to a scheduling conflict-contact number provided . Patient confirmed that [facility] services through [facility] has not been to visit yet . Collaboration phone call to [employer] with [place] to follow up on referral . Patient confirmed that he called [employer] to follow up on request form for personal care services [phone] and was told that the referral form was not received . Patient states that he has  an appointment with his provider on 07/04/19 and will follow up on the form at that time. . Assisted Living options discussed with patient, however patient has declined at this time. . This Child psychotherapist agreed to meet patient at the office for further assistance . Discussed plans with patient for ongoing care management follow up and provided patient with direct contact information for care management team   Patient Self Care Activities:  . Patient verbalizes understanding of plan to contact [employer] to file an appeal for in home services . Unable to perform ADLs independently  Please see past updates related to this goal by clicking on the "Past Updates" button in the selected goal          Follow Up Plan: SW will follow up with patient by phone over the next 7-10 days    [name], [place] Clinical Social Worker  [facility] Care Management [phone]

## 2019-07-03 ENCOUNTER — Inpatient Hospital Stay: Payer: Medicaid Other | Admitting: Adult Health

## 2019-07-03 DIAGNOSIS — G4733 Obstructive sleep apnea (adult) (pediatric): Secondary | ICD-10-CM | POA: Diagnosis not present

## 2019-07-03 DIAGNOSIS — L97821 Non-pressure chronic ulcer of other part of left lower leg limited to breakdown of skin: Secondary | ICD-10-CM | POA: Diagnosis not present

## 2019-07-03 DIAGNOSIS — G894 Chronic pain syndrome: Secondary | ICD-10-CM | POA: Diagnosis not present

## 2019-07-03 DIAGNOSIS — I872 Venous insufficiency (chronic) (peripheral): Secondary | ICD-10-CM | POA: Diagnosis not present

## 2019-07-03 DIAGNOSIS — M10041 Idiopathic gout, right hand: Secondary | ICD-10-CM | POA: Diagnosis not present

## 2019-07-03 DIAGNOSIS — E785 Hyperlipidemia, unspecified: Secondary | ICD-10-CM | POA: Diagnosis not present

## 2019-07-03 DIAGNOSIS — D649 Anemia, unspecified: Secondary | ICD-10-CM | POA: Diagnosis not present

## 2019-07-03 DIAGNOSIS — J449 Chronic obstructive pulmonary disease, unspecified: Secondary | ICD-10-CM | POA: Diagnosis not present

## 2019-07-03 DIAGNOSIS — I5022 Chronic systolic (congestive) heart failure: Secondary | ICD-10-CM | POA: Diagnosis not present

## 2019-07-03 DIAGNOSIS — L97811 Non-pressure chronic ulcer of other part of right lower leg limited to breakdown of skin: Secondary | ICD-10-CM | POA: Diagnosis not present

## 2019-07-03 DIAGNOSIS — G8194 Hemiplegia, unspecified affecting left nondominant side: Secondary | ICD-10-CM | POA: Diagnosis not present

## 2019-07-03 DIAGNOSIS — E1151 Type 2 diabetes mellitus with diabetic peripheral angiopathy without gangrene: Secondary | ICD-10-CM | POA: Diagnosis not present

## 2019-07-03 DIAGNOSIS — I110 Hypertensive heart disease with heart failure: Secondary | ICD-10-CM | POA: Diagnosis not present

## 2019-07-03 DIAGNOSIS — I4819 Other persistent atrial fibrillation: Secondary | ICD-10-CM | POA: Diagnosis not present

## 2019-07-03 DIAGNOSIS — I890 Lymphedema, not elsewhere classified: Secondary | ICD-10-CM | POA: Diagnosis not present

## 2019-07-03 DIAGNOSIS — Z7901 Long term (current) use of anticoagulants: Secondary | ICD-10-CM | POA: Diagnosis not present

## 2019-07-03 DIAGNOSIS — M1611 Unilateral primary osteoarthritis, right hip: Secondary | ICD-10-CM | POA: Diagnosis not present

## 2019-07-03 DIAGNOSIS — M545 Low back pain: Secondary | ICD-10-CM | POA: Diagnosis not present

## 2019-07-03 DIAGNOSIS — I2510 Atherosclerotic heart disease of native coronary artery without angina pectoris: Secondary | ICD-10-CM | POA: Diagnosis not present

## 2019-07-03 DIAGNOSIS — F321 Major depressive disorder, single episode, moderate: Secondary | ICD-10-CM | POA: Diagnosis not present

## 2019-07-03 DIAGNOSIS — Z86718 Personal history of other venous thrombosis and embolism: Secondary | ICD-10-CM | POA: Diagnosis not present

## 2019-07-03 DIAGNOSIS — G510 Bell's palsy: Secondary | ICD-10-CM | POA: Diagnosis not present

## 2019-07-03 DIAGNOSIS — Z6841 Body Mass Index (BMI) 40.0 and over, adult: Secondary | ICD-10-CM | POA: Diagnosis not present

## 2019-07-03 DIAGNOSIS — E662 Morbid (severe) obesity with alveolar hypoventilation: Secondary | ICD-10-CM | POA: Diagnosis not present

## 2019-07-03 DIAGNOSIS — Z792 Long term (current) use of antibiotics: Secondary | ICD-10-CM | POA: Diagnosis not present

## 2019-07-03 NOTE — Progress Notes (Deleted)
Established patient visit   Patient: [name] [name]   DOB: Jun 19, 1960   59 y.o. Male  MRN: [id number] Visit Date: 07/03/2019  Today's healthcare provider: Jairo Ben, FNP   No chief complaint on file.  Subjective    HPI Follow up Hospitalization  Patient was admitted to [facility] on 06/22/19 and discharged on 06/26/19. He was treated for Acute on chronic systolic CHF (congestive heart failure), elevated troponin and chest pain. Treatment for this included,he was treated with linezolid and cefepime and discharged on Bactrim to complete a 2-week course. Telephone follow up was done on 06/28/19  He reports {excellent/good/fair:19992} compliance with treatment. He reports this condition is {resolved/improved/worsened:23923}.  ----------------------------------------------------------------------------------------- -   Patient Active Problem List   Diagnosis Date Noted  . Pressure injury of skin 06/26/2019  . Cellulitis due to methicillin-resistant Staphylococcus aureus (MRSA) 06/23/2019  . Iron deficiency anemia 06/22/2019  . Depression 06/22/2019  . Elevated troponin 06/22/2019  . Left-sided Bell's palsy 05/08/2019  . Slurred speech 05/03/2019  . Chronic intractable headache 05/03/2019  . Head injuries, initial encounter 05/03/2019  . Hand pain, left 05/03/2019  . Noncompliance by refusing service 05/03/2019  . Facial droop 05/02/2019  . Pharmacologic therapy 04/11/2019  . Disorder of skeletal system 04/11/2019  . Problems influencing health status 04/11/2019  . Chronic anticoagulation (warfarin  COUMADIN) 04/11/2019  . Hypocalcemia 04/11/2019  . Elevated sed rate 04/11/2019  . Elevated C-reactive protein (CRP) 04/11/2019  . Elevated hemoglobin A1c 04/11/2019  . Hypoalbuminemia 04/11/2019  . Edema due to hypoalbuminemia 04/11/2019  . Elevated brain natriuretic peptide (BNP) level 04/11/2019  . Chronic hip pain (Right) 04/11/2019  . Osteoarthritis of hip  (Right) 04/11/2019  . Atrial fibrillation with RVR (HCC) 03/08/2019  . Degenerative joint disease of right hip 03/05/2019  . Hypothyroidism 03/04/2019  . Chronic venous stasis dermatitis of both lower extremities 03/04/2019  . Long term (current) use of anticoagulants 02/23/2019  . Cellulitis 01/22/2019  . PE (pulmonary thromboembolism) (HCC) 01/21/2019  . Open leg wound 01/21/2019  . HLD (hyperlipidemia) 01/21/2019  . CAD (coronary artery disease) 01/21/2019  . Lower extremity cellulitis 01/21/2019  . Noncompliance 01/09/2019  . Acquired thrombophilia (HCC) 11/28/2018  . Subtherapeutic international normalized ratio (INR) 11/11/2018  . Acute pulmonary embolism (HCC) 11/01/2018  . Depression, major, single episode, moderate (HCC) 09/17/2018  . Chest pain 08/06/2018  . Personal history of DVT (deep vein thrombosis) 07/13/2018  . History of pulmonary embolism (on Coumadin) 07/13/2018  . Chronic low back pain (Bilateral)  w/ sciatica (Bilateral) 07/13/2018  . TBI (traumatic brain injury) (HCC) 07/04/2018  . Anemia 06/27/2018  . Abnormal thyroid blood test 06/27/2018  . Diet-controlled diabetes mellitus (HCC) 06/06/2018  . HTN (hypertension) 06/06/2018  . Chronic gout involving toe, unspecified cause, unspecified laterality 06/06/2018  . Morbid obesity (HCC) 06/06/2018  . Chronic pain syndrome 06/06/2018  . Ulcers of both lower extremities, limited to breakdown of skin (HCC) 06/06/2018  . Gout 06/06/2018  . Persistent atrial fibrillation (HCC)   . Chronic combined systolic and diastolic heart failure (HCC) 02/02/2018  . COPD (chronic obstructive pulmonary disease) (HCC) 02/02/2018  . Obstructive sleep apnea 02/02/2018  . Lymphedema 02/02/2018  . Acute on chronic systolic CHF (congestive heart failure) (HCC) 02/02/2018  . Acute respiratory failure with hypoxia (HCC) 01/27/2018   Past Medical History:  Diagnosis Date  . Anxiety   . Arthritis   . Asthma   . Brain damage   .  Chronic pain of both knees  07/13/2018  . Clotting disorder (HCC)   . COPD (chronic obstructive pulmonary disease) (HCC)   . Depression   . HFrEF (heart failure with reduced ejection fraction) (HCC)    a. 03/2018 Echo: EF 25-30%, diff HK. Mod LAE.  [name] History of DVT (deep vein thrombosis)   . History of pulmonary embolism    a. Chronic coumadin.  [name] Hypertension   . MI (myocardial infarction) (HCC)   . Morbid obesity (HCC)   . Neck pain 07/13/2018  . NICM (nonischemic cardiomyopathy) (HCC)    a. s/p Cath x 3 - reportedly nl cors. Last cath 2019 in [place]; b. a. 03/2018 Echo: EF 25-30%, diff HK.  [name] Persistent atrial fibrillation (HCC)    a. 03/2018 s/p DCCV; b. CHA2DS2VASc = 1-->Xarelto (later changed to warfarin); c. 05/2018 recurrent afib-->Amio initiated.  . Sleep apnea    Past Surgical History:  Procedure Laterality Date  . CARDIOVERSION N/A 03/24/2018   Procedure: CARDIOVERSION;  Surgeon: Antonieta Iba, MD;  Location: [facility] ORS;  Service: Cardiovascular;  Laterality: N/A;  . CARDIOVERSION N/A 08/08/2018   Procedure: CARDIOVERSION;  Surgeon: [name], MD;  Location: [facility] ORS;  Service: Cardiovascular;  Laterality: N/A;  . hearnia repair     X 3- total of two surgeries  . HERNIA REPAIR    . LEG SURGERY     No Known Allergies     Medications: Outpatient Medications Prior to Visit  Medication Sig  . albuterol (VENTOLIN HFA) 108 (90 Base) MCG/ACT inhaler Inhale 2 puffs into the lungs every 6 (six) hours as needed for wheezing or shortness of breath.  . allopurinol (ZYLOPRIM) 100 MG tablet Take 1 tablet (100 mg total) by mouth daily.  [name] ALPRAZolam (XANAX) 0.5 MG tablet Take 1 tablet (0.5 mg total) by mouth 2 (two) times daily as needed for anxiety or sleep.  . colchicine 0.6 MG tablet 1 tab PO q 12 hrs as needed until gout flare subsides, schedule on office follow up (Patient taking differently: Take 0.6 mg by mouth daily as needed. )  . DULoxetine (CYMBALTA) 20 MG capsule Take 1  capsule (20 mg total) by mouth daily.  . ferrous sulfate 325 (65 FE) MG tablet Take 1 tablet (325 mg total) by mouth 2 (two) times daily with a meal.  . gabapentin (NEURONTIN) 300 MG capsule Take 1 capsule (300 mg total) by mouth 3 (three) times daily.  [name] HYDROcodone-acetaminophen (NORCO/VICODIN) 5-325 MG tablet Take 1-2 tablets by mouth every 6 (six) hours as needed for moderate pain or severe pain.  [name] levothyroxine (SYNTHROID) 50 MCG tablet Take 1 tablet (50 mcg total) by mouth daily before breakfast. Need lab work beginning of June for Thyroid  . lisinopril (ZESTRIL) 2.5 MG tablet Take 1 tablet (2.5 mg total) by mouth daily.  [name] loperamide (IMODIUM) 2 MG capsule Take 1 capsule (2 mg total) by mouth as needed for diarrhea or loose stools.  . metoprolol succinate (TOPROL-XL) 50 MG 24 hr tablet Take 1 tablet (50 mg total) by mouth daily. Take with or immediately following a meal.  . nitroGLYCERIN (NITROSTAT) 0.4 MG SL tablet Place 1 tablet under tongue every 5 minutes as needed for chest pain. (No more than 3 doses within 15 minutes)  . sulfamethoxazole-trimethoprim (BACTRIM DS) 800-160 MG tablet Take 1 tablet by mouth every 12 (twelve) hours for 10 days.  [name] torsemide (DEMADEX) 20 MG tablet Take 4 tablets (80 mg total) by mouth 2 (two) times daily.  . traZODone (DESYREL) 150  MG tablet TAKE 1 TABLET BY MOUTH AT BEDTIME AS NEEDED SLEEP  . warfarin (COUMADIN) 5 MG tablet Take 1 tablet (5 mg total) by mouth daily.   No facility-administered medications prior to visit.    Review of Systems  Last CBC Lab Results  Component Value Date   WBC 10.4 06/25/2019   HGB 11.6 (L) 06/25/2019   HCT 35.9 (L) 06/25/2019   MCV 88.4 06/25/2019   MCH 28.6 06/25/2019   RDW 18.6 (H) 06/25/2019   PLT 231 80/16/5537   Last metabolic panel Lab Results  Component Value Date   GLUCOSE 116 (H) 06/26/2019   NA 135 06/26/2019   K 4.2 06/26/2019   CL 95 (L) 06/26/2019   CO2 31 06/26/2019   BUN 27 (H) 06/26/2019     CREATININE 1.11 06/26/2019   GFRNONAA >60 06/26/2019   GFRAA >60 06/26/2019   CALCIUM 8.8 (L) 06/26/2019   PROT 7.3 06/22/2019   ALBUMIN 3.1 (L) 06/22/2019   LABGLOB 3.0 05/02/2019   AGRATIO 1.3 05/02/2019   BILITOT 0.8 06/22/2019   ALKPHOS 93 06/22/2019   AST 28 06/22/2019   ALT 19 06/22/2019   ANIONGAP 9 06/26/2019      Objective    There were no vitals taken for this visit. {Show previous vital signs (optional):23777::" "}  Physical Exam  ***  No results found for any visits on 07/03/19.  Assessment & Plan     ***  No follow-ups on file.      {provider attestation***:1}   Marcille Buffy, [place] [phone] (phone) [phone] (fax)  [place]

## 2019-07-04 ENCOUNTER — Ambulatory Visit: Payer: Self-pay | Admitting: *Deleted

## 2019-07-04 ENCOUNTER — Ambulatory Visit: Payer: Medicaid Other | Admitting: Internal Medicine

## 2019-07-04 DIAGNOSIS — J449 Chronic obstructive pulmonary disease, unspecified: Secondary | ICD-10-CM

## 2019-07-04 DIAGNOSIS — G894 Chronic pain syndrome: Secondary | ICD-10-CM

## 2019-07-04 DIAGNOSIS — G510 Bell's palsy: Secondary | ICD-10-CM | POA: Diagnosis not present

## 2019-07-04 NOTE — Chronic Care Management (AMB) (Signed)
Care Management    Clinical Social Work Follow Up Note  07/04/2019 Name: [name] [name] MRN: [id number] DOB: 01/14/1961  [name] [name] is a 59 y.o. year old male who is a primary care patient of Flinchum, Kelby Aline, FNP. The CCM team was consulted for assistance with [name] .   Review of patient status, including review of consultants reports, other relevant assessments, and collaboration with appropriate care team members and the patient's provider was performed as part of comprehensive patient evaluation and provision of chronic care management services.    SDOH (Social Determinants of Health) assessments performed: No    Outpatient Encounter Medications as of 07/04/2019  Medication Sig  . albuterol (VENTOLIN HFA) 108 (90 Base) MCG/ACT inhaler Inhale 2 puffs into the lungs every 6 (six) hours as needed for wheezing or shortness of breath.  . allopurinol (ZYLOPRIM) 100 MG tablet Take 1 tablet (100 mg total) by mouth daily.  [name] ALPRAZolam (XANAX) 0.5 MG tablet Take 1 tablet (0.5 mg total) by mouth 2 (two) times daily as needed for anxiety or sleep.  . colchicine 0.6 MG tablet 1 tab PO q 12 hrs as needed until gout flare subsides, schedule on office follow up (Patient taking differently: Take 0.6 mg by mouth daily as needed. )  . DULoxetine (CYMBALTA) 20 MG capsule Take 1 capsule (20 mg total) by mouth daily.  . ferrous sulfate 325 (65 FE) MG tablet Take 1 tablet (325 mg total) by mouth 2 (two) times daily with a meal.  . gabapentin (NEURONTIN) 300 MG capsule Take 1 capsule (300 mg total) by mouth 3 (three) times daily.  [name] HYDROcodone-acetaminophen (NORCO/VICODIN) 5-325 MG tablet Take 1-2 tablets by mouth every 6 (six) hours as needed for moderate pain or severe pain.  [name] levothyroxine (SYNTHROID) 50 MCG tablet Take 1 tablet (50 mcg total) by mouth daily before breakfast. Need lab work beginning of June for Thyroid  . lisinopril (ZESTRIL) 2.5 MG tablet Take 1 tablet (2.5 mg  total) by mouth daily.  [name] loperamide (IMODIUM) 2 MG capsule Take 1 capsule (2 mg total) by mouth as needed for diarrhea or loose stools.  . metoprolol succinate (TOPROL-XL) 50 MG 24 hr tablet Take 1 tablet (50 mg total) by mouth daily. Take with or immediately following a meal.  . nitroGLYCERIN (NITROSTAT) 0.4 MG SL tablet Place 1 tablet under tongue every 5 minutes as needed for chest pain. (No more than 3 doses within 15 minutes)  . sulfamethoxazole-trimethoprim (BACTRIM DS) 800-160 MG tablet Take 1 tablet by mouth every 12 (twelve) hours for 10 days.  [name] torsemide (DEMADEX) 20 MG tablet Take 4 tablets (80 mg total) by mouth 2 (two) times daily.  . traZODone (DESYREL) 150 MG tablet TAKE 1 TABLET BY MOUTH AT BEDTIME AS NEEDED SLEEP  . warfarin (COUMADIN) 5 MG tablet Take 1 tablet (5 mg total) by mouth daily.   No facility-administered encounter medications on file as of 07/04/2019.     Goals Addressed            This Visit's Progress   . "I was denied more aid service, I need a aid (pt-stated)       CARE PLAN ENTRY (see longtitudinal plan of care for additional care plan information)  Current Barriers:  . ADL IADL limitations  Clinical Social Work Clinical Goal(s):  [name] Over the next 90 days, patient will work with [facility] to address needs related to be denied continued in home aid services  Interventions: . Follow up phone call to patient to remind him that his appointment with his primary care doctor was 07/03/19 not today at 1:30 pm . Discussed the importance of keeping up with his appointments by writing his appointments down on a calendar . Emphasized the importance of keeping his appointments and allowing HH to come in to work with him to avoid being dismissed from the service . Per patient, he discussed increased frustration with having no in home aid . CSW to continue to follow up with personal care services through [facility] healthcare   Patient Self Care  Activities:  . Patient verbalizes understanding of plan to contact [employer] to file an appeal for in home services . Unable to perform ADLs independently  Please see past updates related to this goal by clicking on the "Past Updates" button in the selected goal          Follow Up Plan: SW will follow up with patient by phone over the next 7-10 business days    [place], [place] Clinical Social Worker  [facility] Care Management [phone]

## 2019-07-04 NOTE — Patient Instructions (Signed)
Thank you allowing the Chronic Care Management Team to be a part of your care! It was a pleasure speaking with you today!  1. Please prioritize follow up with your medical providers  CCM (Chronic Care Management) Team   Juanell Fairly  RN, BSN Nurse Care Coordinator  [phone]   Aretha Levi [address], LCSW Clinical Social Worker [phone]  Goals Addressed            This Visit's Progress   . "I was denied more aid service, I need a aid (pt-stated)       CARE PLAN ENTRY (see longtitudinal plan of care for additional care plan information)  Current Barriers:  . ADL IADL limitations  Clinical Social Work Clinical Goal(s):  [name] Over the next 90 days, patient will work with [facility] to address needs related to be denied continued in home aid services  Interventions: . Follow up phone call to patient to remind him that his appointment with his primary care doctor was 07/03/19 not today at 1:30 pm . Discussed the importance of keeping up with his appointments by writing his appointments down on a calendar . Emphasized the importance of keeping his appointments and allowing HH to come in to work with him to avoid being dismissed from the service . Per patient, he discussed increased frustration with having no in home aid . CSW to continue to follow up with personal care services through [facility] healthcare   Patient Self Care Activities:  . Patient verbalizes understanding of plan to contact [employer] to file an appeal for in home services . Unable to perform ADLs independently  Please see past updates related to this goal by clicking on the "Past Updates" button in the selected goal          The patient verbalized understanding of instructions provided today and declined a print copy of patient instruction materials.   Telephone follow up appointment with care management team member scheduled for: 07/09/19

## 2019-07-05 ENCOUNTER — Encounter (HOSPITAL_COMMUNITY): Payer: Self-pay

## 2019-07-05 ENCOUNTER — Ambulatory Visit: Payer: Self-pay

## 2019-07-05 ENCOUNTER — Other Ambulatory Visit: Payer: Self-pay | Admitting: Adult Health

## 2019-07-05 ENCOUNTER — Telehealth: Payer: Self-pay

## 2019-07-05 ENCOUNTER — Other Ambulatory Visit (HOSPITAL_COMMUNITY): Payer: Self-pay

## 2019-07-05 DIAGNOSIS — I872 Venous insufficiency (chronic) (peripheral): Secondary | ICD-10-CM | POA: Diagnosis not present

## 2019-07-05 DIAGNOSIS — I2510 Atherosclerotic heart disease of native coronary artery without angina pectoris: Secondary | ICD-10-CM | POA: Diagnosis not present

## 2019-07-05 DIAGNOSIS — E1151 Type 2 diabetes mellitus with diabetic peripheral angiopathy without gangrene: Secondary | ICD-10-CM | POA: Diagnosis not present

## 2019-07-05 DIAGNOSIS — I4891 Unspecified atrial fibrillation: Secondary | ICD-10-CM

## 2019-07-05 DIAGNOSIS — L97821 Non-pressure chronic ulcer of other part of left lower leg limited to breakdown of skin: Secondary | ICD-10-CM | POA: Diagnosis not present

## 2019-07-05 DIAGNOSIS — I5022 Chronic systolic (congestive) heart failure: Secondary | ICD-10-CM | POA: Diagnosis not present

## 2019-07-05 DIAGNOSIS — E662 Morbid (severe) obesity with alveolar hypoventilation: Secondary | ICD-10-CM

## 2019-07-05 DIAGNOSIS — I110 Hypertensive heart disease with heart failure: Secondary | ICD-10-CM | POA: Diagnosis not present

## 2019-07-05 DIAGNOSIS — M10041 Idiopathic gout, right hand: Secondary | ICD-10-CM | POA: Diagnosis not present

## 2019-07-05 DIAGNOSIS — Z7901 Long term (current) use of anticoagulants: Secondary | ICD-10-CM | POA: Diagnosis not present

## 2019-07-05 DIAGNOSIS — J449 Chronic obstructive pulmonary disease, unspecified: Secondary | ICD-10-CM | POA: Diagnosis not present

## 2019-07-05 DIAGNOSIS — Z91199 Patient's noncompliance with other medical treatment and regimen due to unspecified reason: Secondary | ICD-10-CM

## 2019-07-05 DIAGNOSIS — M1611 Unilateral primary osteoarthritis, right hip: Secondary | ICD-10-CM | POA: Diagnosis not present

## 2019-07-05 DIAGNOSIS — J9601 Acute respiratory failure with hypoxia: Secondary | ICD-10-CM

## 2019-07-05 DIAGNOSIS — E785 Hyperlipidemia, unspecified: Secondary | ICD-10-CM | POA: Diagnosis not present

## 2019-07-05 DIAGNOSIS — G510 Bell's palsy: Secondary | ICD-10-CM | POA: Diagnosis not present

## 2019-07-05 DIAGNOSIS — M545 Low back pain: Secondary | ICD-10-CM | POA: Diagnosis not present

## 2019-07-05 DIAGNOSIS — I4819 Other persistent atrial fibrillation: Secondary | ICD-10-CM | POA: Diagnosis not present

## 2019-07-05 DIAGNOSIS — G894 Chronic pain syndrome: Secondary | ICD-10-CM | POA: Diagnosis not present

## 2019-07-05 DIAGNOSIS — G4733 Obstructive sleep apnea (adult) (pediatric): Secondary | ICD-10-CM | POA: Diagnosis not present

## 2019-07-05 DIAGNOSIS — Z6841 Body Mass Index (BMI) 40.0 and over, adult: Secondary | ICD-10-CM | POA: Diagnosis not present

## 2019-07-05 DIAGNOSIS — F321 Major depressive disorder, single episode, moderate: Secondary | ICD-10-CM | POA: Diagnosis not present

## 2019-07-05 DIAGNOSIS — I890 Lymphedema, not elsewhere classified: Secondary | ICD-10-CM | POA: Diagnosis not present

## 2019-07-05 DIAGNOSIS — G8194 Hemiplegia, unspecified affecting left nondominant side: Secondary | ICD-10-CM | POA: Diagnosis not present

## 2019-07-05 DIAGNOSIS — Z792 Long term (current) use of antibiotics: Secondary | ICD-10-CM | POA: Diagnosis not present

## 2019-07-05 DIAGNOSIS — L97811 Non-pressure chronic ulcer of other part of right lower leg limited to breakdown of skin: Secondary | ICD-10-CM | POA: Diagnosis not present

## 2019-07-05 DIAGNOSIS — Z86718 Personal history of other venous thrombosis and embolism: Secondary | ICD-10-CM | POA: Diagnosis not present

## 2019-07-05 DIAGNOSIS — D649 Anemia, unspecified: Secondary | ICD-10-CM | POA: Diagnosis not present

## 2019-07-05 NOTE — Telephone Encounter (Signed)
Please review request. KW 

## 2019-07-05 NOTE — Telephone Encounter (Signed)
Isnt this already taken care of? Im confused, please review. KW

## 2019-07-05 NOTE — Telephone Encounter (Signed)
Crystal- Mr. Stefanko was discharged home with [facility] - can you follow up with this ?

## 2019-07-05 NOTE — Telephone Encounter (Signed)
Copied from CRM [id number]. Topic: Referral - Request for Referral >> Jul 05, 2019 10:57 AM Crist Infante wrote: Neysa Bonito with [facility] visits pt regularly.. At this visit pt expresses that he would like to switch heart doctors.  Pt would like to switch to Dr Juliann Pares at [place]. Pt needs referral to switch due to having medicaid.

## 2019-07-05 NOTE — Telephone Encounter (Signed)
Placed urgent referral to [facility] cardiology per patients request, Dr. Okey Dupre was notified.

## 2019-07-05 NOTE — Progress Notes (Signed)
Today had a home visit with [name] [name].  He states doing ok.  He stays sore all the time.  He is mad at his cardiology when he goes for his INR check.  He has about 2 weeks of warfarin left and he wants a new cardiology. He wants to switch all his doctors to [place] [facility].  Contacted Dr Juliann Pares office for him and made appt for Jun 1st at 8:45 and contacted [name] to get a referral sent for him due to he has medicaid.  He also wants a new PCP at [facility], advised him lets do 1 at a time.  He will have to notify medicaid to get new PCP.  He has legs wrapped today.  Physical therapy showed up for an evaluation while I was there.  Lungs are clear.  He has very little activity during the day.  He sits with feet down all day playing games on computer.  He is able to stand and walk with walker.  He has electric wheelchair for when he leaves the house.  He has a wheel chair [name] he can drive to places.  He has all his medications and aware of how to take them.  He does not watch what he eats and it appears he is drinking too much fluid a day.  With PT there was hard to educate him on diet and fluids.  Will  Discuss fluids and diet next visit. He denies any chest pain, headaches, dizziness or shortness of breath at rest.  He does have shortness of breath with exertion, he states that is his normal.   Will continue to visit for heart failure.   [employer] [name] EMT-Paramedic [phone]  [name] Wallace EMT-Paramedic [phone]

## 2019-07-05 NOTE — Progress Notes (Signed)
Orders Placed This Encounter  Procedures  . Ambulatory referral to Cardiology    Referral Priority:   Urgent    Referral Type:   Consultation    Referral Reason:   Specialty Services Required    Requested Specialty:   Cardiology    Number of Visits Requested:   1  he wants to switch to [facility] cardiology. Dr. Okey Dupre was notified.

## 2019-07-05 NOTE — Telephone Encounter (Signed)
Copied from CRM [id number]. Topic: General - Other >> Jul 05, 2019 12:09 PM Gwenlyn Fudge wrote: Reason for CRM: Pt called and is requesting to have Home health orders placed for him. Pt states that he tried to get them placed 2 months ago but never heard back. Pt states it is supposed to be [facility] form 3051 and is requesting to have it sent to [facility] by an Lawanna Kobus.  Please advise.

## 2019-07-06 ENCOUNTER — Encounter: Payer: Medicaid Other | Admitting: Internal Medicine

## 2019-07-06 ENCOUNTER — Other Ambulatory Visit: Payer: Self-pay

## 2019-07-06 DIAGNOSIS — E11622 Type 2 diabetes mellitus with other skin ulcer: Secondary | ICD-10-CM | POA: Diagnosis not present

## 2019-07-06 DIAGNOSIS — I890 Lymphedema, not elsewhere classified: Secondary | ICD-10-CM | POA: Diagnosis not present

## 2019-07-06 DIAGNOSIS — J449 Chronic obstructive pulmonary disease, unspecified: Secondary | ICD-10-CM | POA: Diagnosis not present

## 2019-07-06 DIAGNOSIS — L97812 Non-pressure chronic ulcer of other part of right lower leg with fat layer exposed: Secondary | ICD-10-CM | POA: Diagnosis not present

## 2019-07-06 DIAGNOSIS — I509 Heart failure, unspecified: Secondary | ICD-10-CM | POA: Diagnosis not present

## 2019-07-06 DIAGNOSIS — I110 Hypertensive heart disease with heart failure: Secondary | ICD-10-CM | POA: Diagnosis not present

## 2019-07-06 DIAGNOSIS — I252 Old myocardial infarction: Secondary | ICD-10-CM | POA: Diagnosis not present

## 2019-07-06 DIAGNOSIS — I872 Venous insufficiency (chronic) (peripheral): Secondary | ICD-10-CM | POA: Diagnosis not present

## 2019-07-06 NOTE — Progress Notes (Signed)
HYMIE, GORR ([id number]) Visit Report for 07/06/2019 Debridement Details Patient Name: [name] [name], [name] [name] Date of Service: 07/06/2019 8:00 AM Medical Record Number: [id number] Patient Account Number: [id number] Date of Birth/Sex: 05/14/1960 (59 y.o. M) Treating RN: Montey Hora Primary Care Provider: Laverna Peace Other Clinician: Referring Provider: Laverna Peace Treating Provider/Extender: Tito Dine in Treatment: 9 Debridement Performed for Wound #7 Right,Anterior Lower Leg Assessment: Performed By: Physician Ricard Dillon, MD Debridement Type: Debridement Severity of Tissue Pre Debridement: Fat layer exposed Level of Consciousness (Pre- Awake and Alert procedure): Pre-procedure Verification/Time Out Yes - 08:42 Taken: Start Time: 08:42 Pain Control: Lidocaine 4% Topical Solution Total Area Debrided (L x W): 4 (cm) x 7 (cm) = 28 (cm) Tissue and other material Viable, Non-Viable, Eschar, Slough, Slough debrided: Level: Non-Viable Tissue Debridement Description: Selective/Open Wound Instrument: Curette Bleeding: Minimum Hemostasis Achieved: Pressure End Time: 08:44 Procedural Pain: 0 Post Procedural Pain: 0 Response to Treatment: Procedure was tolerated well Level of Consciousness (Post- Awake and Alert procedure): Post Debridement Measurements of Total Wound Length: (cm) 5 Width: (cm) 12 Depth: (cm) 0.2 Volume: (cm) 9.425 Character of Wound/Ulcer Post Debridement: Improved Severity of Tissue Post Debridement: Fat layer exposed Post Procedure Diagnosis Same as Pre-procedure Electronic Signature(s) Signed: 07/06/2019 12:54:43 PM By: Linton Ham MD Signed: 07/06/2019 1:36:47 PM By: Montey Hora Entered By: Linton Ham on 07/06/2019 08:52:26 [name] [name] ([id number]) -------------------------------------------------------------------------------- HPI Details Patient Name: [name] [name] Date of Service: 07/06/2019 8:00  AM Medical Record Number: [id number] Patient Account Number: [id number] Date of Birth/Sex: 1960-06-06 (59 y.o. M) Treating RN: Montey Hora Primary Care Provider: Laverna Peace Other Clinician: Referring Provider: Laverna Peace Treating Provider/Extender: Tito Dine in Treatment: 9 History of Present Illness HPI Description: 01/18/2019 on evaluation today patient presents for initial evaluation here in our clinic concerning issues he is been having with his bilateral lower extremities. He appears based on what I am seeing to have bilateral lower extremity cellulitis along with chronic venous stasis. He has significant pitting edema even in the bottom of his foot. With that being said he also has a history of hypertension, COPD, congestive heart failure, long-term use of anticoagulants due to atrial fibrillation which is paroxysmal as well as having had a pulmonary embolism in the past. With that being said he tells me that he has constant drainage out of his left lower extremity. This is likely due to the fact that again he has significant swelling pretty much all the time. With that being said in the past 2 weeks this has been getting worse and he tells me at this point that he feels like it is infected is also hurting a lot more than it has been in the past and the drainage is a different color. The left side is definitely worse than the right. Fortunately there is no signs of active infection systemically although I am concerned due to the severity of the infection that I see in the left lower extremity especially. There is a lot of necrotic tissue on the surface of both wounds that at some point is going to need to be cleaned off to allow these to heal but right now obviously this is much too tender for him. No fevers, chills, nausea, vomiting, or diarrhea. 02/02/2019 on evaluation today patient presents for follow-up concerning the wounds over his bilateral lower  extremities. He is continuing to manage. He was in the hospital and has been discharged at this point. He was admitted from 01/21/2019 through 01/27/2019. This  was due to cellulitis of lower extremity. Nonetheless he does seem to be doing much better at this point. I did review his discharge summary as well he did not appear to have any positive findings on blood cultures. His white blood cell count was never really elevated either which is also good news. He was treated with IV clindamycin for 4 days and then switch to complete a 10-day course at home which she is still working on at this point. They did have him on IV Lasix in the hospital which helped him diurese well on discharge they stated that his weight was 408 pounds which is still higher than his dry weight of 375 to 380 pounds. Upon discharge his torsemide was increased from 40 to 60 mg twice daily and he is to follow-up with cardiology as well. 12/29-Patient returns at 2 weeks for evaluation and follow-up of his bilateral lower extremity wounds, with lymphedema, venous ulcerations, patient was recently hospitalized and was aggressively diuresed after which his torsemide has been increased. He follows up with cardiology as well. His left leg stasis wounds are more moist and have maceration of tissue, the right one is less so. We are using silver cell and unna compression given the degree of edema in the legs. Readmission: 05/04/2019 patient presents today for reevaluation here in the clinic I have not seen him since December 2020. Subsequently this is a fairly sick individual with multiple comorbidities who presents for follow-up concerning wounds of his bilateral lower extremities. He tells me that after he was in the hospital following when we saw him last in December that he ended up subsequently being discharged with home health services and they have been coming out and applying Unna boot wraps. With that being said he has used up all  of his home health visits for the year and then for the last week has not had anything on his legs as far as the wrap is concerned. Obviously this is not good and he does come in today with increased swelling or at least what I perceived to be along with somewhat dry wounds again he does not seem to be weeping too much at this point which is at least good news but he has significant lymphedema/venous stasis. No fevers, chills, nausea, vomiting, or diarrhea. 06/01/2019 upon evaluation today patient is seen after not having been seen actually since 19 2021. He kept coming for appointment with every time he would come his temperature would be elevated past the point where we could see him. I told him that he needed to go be seen at the ER during 1 of these times due to the fact that obviously this was a fever of unknown origin at that point and my concern was that it could potentially be his legs but again I was not entirely sure as obviously I could not see them due to the restrictions secondary to Covid even bringing the patient into the clinic. Nonetheless he finally came in today his temperature again was elevated but not to the same degree that it met the 100.4 or above guidelines of having to cancel the appointment. Either way we did have a look at his legs unfortunately he appears to show signs of cellulitis at this point which is probably where the temperature elevations are emanating from. He has significant lymphedema as well as chronic vent venous stasis as well. When I initially told him that is why I told him to go to the ER previously  when his temperature was elevated in case it was something with wounds or otherwise he replied "nobody ever told me to go to the ER". I then subsequently reminded him of me coming to the front desk due to his temperature being elevated and advising him to go to the ER to which she replied "I am not going to the ER and I do not think I need to even with this".  Nonetheless I am concerned about the infection of his legs at this point he has significant weeping, significant edema, and overall is not doing very well in my opinion. 4/23; the patient arrives in clinic today having napkin stuck on his wounds. He is taking the antibiotic from last week. Last Wednesday he had his INR checked at the Coumadin clinic it was apparently "sky high" and his Coumadin is now on hold. He is going back to check this again on Monday. His legs were not put back in compression last week out of fear of the extending infection with fever. He has been taking the antibiotics. Culture from last week showed Enterobacter and methicillin resistant staph aureus. He is on on doxycycline which the MRSA was sensitive to Enterobacter was not plated but given the pattern of sensitivities probably the doxycycline was satisfactory. I will extend the doxycycline another 4 days until he is seen next week. From an infection point of view this looks better 06/14/2019 upon evaluation today patient unfortunately continues to have signs of cellulitis on the lower extremities bilaterally especially on the left. Unfortunately he doesn't seem to be doing much better despite being on doxycycline for a couple of weeks. He was positive for MRSA as well as Enterobacter. Obviously the MRSA is treated by way of the doxycycline the Enterobacter unfortunately is not I'm going to see about initiating Bactrim for him at this point. 5/13; since he was last here he was hospitalized from 5/7 through 5/11. Mostly this was for congestive heart failure. He has an ejection fraction of 25 to 30%. His torsemide dose was increased. While he was there they dealt with the infection in the right leg. He did not take his Bactrim that he was prescribed last time. He was treated with linezolid in the hospital and cefepime and discharged on Bactrim to complete a 2-week course. He was supposed to follow-up with the Coumadin clinic at  his cardiologist's office [Dr. End). He has not done this. He tells me he is on Coumadino 5 mg twice a day. The last INR I see is from 5/11 when he was discharged from the hospital it was 2.1 however it was increasing every day they check this. He was thought to have cellulitis from methicillin-resistant staph aureus. [name] [name], [name] [name] ([id number]) 5/21; patient's wound on the right lower leg actually looks a lot better although not much change in size. We used Iodoflex under an Haematologist last week. The [employer] stayed in place. Electronic Signature(s) Signed: 07/06/2019 12:54:43 PM By: Linton Ham MD Entered By: Linton Ham on 07/06/2019 08:48:24 [name] [name], [name] [name] ([id number]) -------------------------------------------------------------------------------- Physical Exam Details Patient Name: [name] [name] Date of Service: 07/06/2019 8:00 AM Medical Record Number: [id number] Patient Account Number: [id number] Date of Birth/Sex: 07/31/1960 (59 y.o. M) Treating RN: Montey Hora Primary Care Provider: Laverna Peace Other Clinician: Referring Provider: Laverna Peace Treating Provider/Extender: Tito Dine in Treatment: 9 Constitutional Sitting or standing Blood Pressure is within target range for patient.. Pulse regular and within target range for patient.[name] Respirations regular, non- labored and  within target range.. Temperature is normal and within the target range for the patient.[name] appears in no distress. Notes Wound exam; severe bilateral lymphedema. On the right lateral leg surrounding inflammation does not look too bad. The wound itself is not really much different from last week however surface debrided mostly washed off with Anasept and gauze however I still had to use a #5 curette to clean up the wound surfaces. There was no bleeding. Electronic Signature(s) Signed: 07/06/2019 12:54:43 PM By: Linton Ham MD Entered By: Linton Ham on 07/06/2019  08:49:21 [name] [name] ([id number]) -------------------------------------------------------------------------------- Physician Orders Details Patient Name: [name] [name] Date of Service: 07/06/2019 8:00 AM Medical Record Number: [id number] Patient Account Number: [id number] Date of Birth/Sex: [birth year] (59 y.o. M) Treating RN: Montey Hora Primary Care Provider: Laverna Peace Other Clinician: Referring Provider: Laverna Peace Treating Provider/Extender: Tito Dine in Treatment: 9 Verbal / Phone Orders: No Diagnosis Coding Wound Cleansing Wound #5 Left,Lateral Lower Leg o Cleanse wound with mild soap and water o May shower with protection. Wound #7 Right,Anterior Lower Leg o Cleanse wound with mild soap and water o May shower with protection. Primary Wound Dressing Wound #5 Left,Lateral Lower Leg o Iodoflex Wound #7 Right,Anterior Lower Leg o Iodoflex Secondary Dressing Wound #5 Left,Lateral Lower Leg o XtraSorb - where needed Wound #7 Right,Anterior Lower Leg o XtraSorb - where needed Dressing Change Frequency Wound #5 Left,Lateral Lower Leg o Other: - twice weekly Wound #7 Right,Anterior Lower Leg o Other: - twice weekly Follow-up Appointments Wound #5 Left,Lateral Lower Leg o Return Appointment in 1 week. o Nurse Visit as needed Wound #7 Right,Anterior Lower Leg o Return Appointment in 1 week. o Nurse Visit as needed Edema Control Wound #5 Left,Lateral Lower Leg o 4 Layer Compression System - Bilateral o Elevate legs to the level of the heart and pump ankles as often as possible Wound #7 Right,Anterior Lower Leg o 4 Layer Compression System - Bilateral o Elevate legs to the level of the heart and pump ankles as often as possible Electronic Signature(s) Signed: 07/06/2019 12:54:43 PM By: Linton Ham MD Signed: 07/06/2019 1:36:47 PM By: Montey Hora Entered By: Montey Hora on 07/06/2019  08:45:28 [name] [name], [name] [name] ([id number]) [name] [name], [name] [name] ([id number]) -------------------------------------------------------------------------------- Problem List Details Patient Name: [name] [name] Date of Service: 07/06/2019 8:00 AM Medical Record Number: [id number] Patient Account Number: [id number] Date of Birth/Sex: 08/20/1960 (59 y.o. M) Treating RN: Montey Hora Primary Care Provider: Laverna Peace Other Clinician: Referring Provider: Laverna Peace Treating Provider/Extender: Tito Dine in Treatment: 9 Active Problems ICD-10 Encounter Code Description Active Date MDM Diagnosis I87.2 Venous insufficiency (chronic) (peripheral) 05/04/2019 No Yes I89.0 Lymphedema, not elsewhere classified 05/04/2019 No Yes L97.812 Non-pressure chronic ulcer of other part of right lower leg with fat layer 05/04/2019 No Yes exposed L97.822 Non-pressure chronic ulcer of other part of left lower leg with fat layer 05/04/2019 No Yes exposed [place] (primary) hypertension 05/04/2019 No Yes J44.9 Chronic obstructive pulmonary disease, unspecified 05/04/2019 No Yes Z79.01 Long term (current) use of anticoagulants 05/04/2019 No Yes I48.0 Paroxysmal atrial fibrillation 05/04/2019 No Yes Z86.711 Personal history of pulmonary embolism 05/04/2019 No Yes 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 M) Treating RN: Montey Hora Primary Care Provider: Laverna Peace Other Clinician: Referring Provider: Laverna Peace Treating Provider/Extender: Tito Dine in Treatment: 9 Subjective History of Present Illness (HPI) 01/18/2019 on evaluation today patient presents for initial evaluation here in our clinic concerning issues he is been having with his bilateral lower extremities. He appears based on what I am seeing to have bilateral lower extremity cellulitis along with chronic venous stasis. He has significant pitting edema even in the bottom of his foot. With that being said he also has a history of hypertension, COPD, congestive heart failure, long-term use of anticoagulants due to atrial fibrillation which is paroxysmal as well as having had a pulmonary embolism in the past. With that being said he tells me that he has constant drainage out of his left lower extremity. This is likely due to the fact that again he has significant swelling pretty much all the time. With that being said in the past 2 weeks this has been getting worse and he tells me at this point that he feels like it is infected is also hurting a lot more than it has been in the past and the drainage is a different color. The left side is definitely worse than the right. Fortunately there is no signs of active infection systemically although I am concerned due to the severity of the infection that I see in the left lower extremity especially. There is a lot of necrotic tissue on the surface of both wounds that at some point is going to need to be cleaned off to allow these to heal but right now obviously this is much too tender for him. No fevers, chills, nausea, vomiting, or diarrhea. 02/02/2019 on evaluation today patient presents for follow-up concerning the wounds over his bilateral lower extremities. He is continuing to manage. He was in the hospital and has been discharged at this point. He was admitted from 01/21/2019 through  01/27/2019. This was due to cellulitis of lower extremity. Nonetheless he does seem to be doing much better at this point. I did review his discharge summary as well he did not appear to have any positive findings on blood cultures. His white blood cell count was never really elevated either which is also good news. He was treated with IV clindamycin for 4 days and then switch to complete a 10-day course at home which she is still working on at this point. They did have him on IV Lasix in the hospital which helped him diurese well on discharge they stated that his weight was 408 pounds which is still higher than his dry weight of 375 to 380 pounds. Upon discharge his torsemide was increased from 40 to 60 mg twice daily and he is to follow-up with cardiology as well. 12/29-Patient returns at 2 weeks for evaluation and follow-up of his bilateral lower extremity wounds, with lymphedema, venous ulcerations, patient was recently hospitalized and was aggressively diuresed after which his torsemide has been increased. He follows up with cardiology as well. His left leg stasis wounds are more moist and have maceration of tissue, the right one is less so. We are using silver cell and unna compression given the degree of edema in the legs. Readmission: 05/04/2019 patient presents today for reevaluation here in the clinic I have not seen him since December 2020. Subsequently this is a fairly sick individual with multiple comorbidities who presents for follow-up concerning wounds of his bilateral lower extremities. He  tells me that after he was in the hospital following when we saw him last in December that he ended up subsequently being discharged with home health services and they have been coming out and applying Unna boot wraps. With that being said he has used up all of his home health visits for the year and then for the last week has not had anything on his legs as far as the wrap is concerned. Obviously  this is not good and he does come in today with increased swelling or at least what I perceived to be along with somewhat dry wounds again he does not seem to be weeping too much at this point which is at least good news but he has significant lymphedema/venous stasis. No fevers, chills, nausea, vomiting, or diarrhea. 06/01/2019 upon evaluation today patient is seen after not having been seen actually since 19 2021. He kept coming for appointment with every time he would come his temperature would be elevated past the point where we could see him. I told him that he needed to go be seen at the ER during 1 of these times due to the fact that obviously this was a fever of unknown origin at that point and my concern was that it could potentially be his legs but again I was not entirely sure as obviously I could not see them due to the restrictions secondary to Covid even bringing the patient into the clinic. Nonetheless he finally came in today his temperature again was elevated but not to the same degree that it met the 100.4 or above guidelines of having to cancel the appointment. Either way we did have a look at his legs unfortunately he appears to show signs of cellulitis at this point which is probably where the temperature elevations are emanating from. He has significant lymphedema as well as chronic vent venous stasis as well. When I initially told him that is why I told him to go to the ER previously when his temperature was elevated in case it was something with wounds or otherwise he replied "nobody ever told me to go to the ER". I then subsequently reminded him of me coming to the front desk due to his temperature being elevated and advising him to go to the ER to which she replied "I am not going to the ER and I do not think I need to even with this". Nonetheless I am concerned about the infection of his legs at this point he has significant weeping, significant edema, and overall is not doing  very well in my opinion. 4/23; the patient arrives in clinic today having napkin stuck on his wounds. He is taking the antibiotic from last week. Last Wednesday he had his INR checked at the Coumadin clinic it was apparently "sky high" and his Coumadin is now on hold. He is going back to check this again on Monday. His legs were not put back in compression last week out of fear of the extending infection with fever. He has been taking the antibiotics. Culture from last week showed Enterobacter and methicillin resistant staph aureus. He is on on doxycycline which the MRSA was sensitive to Enterobacter was not plated but given the pattern of sensitivities probably the doxycycline was satisfactory. I will extend the doxycycline another 4 days until he is seen next week. From an infection point of view this looks better 06/14/2019 upon evaluation today patient unfortunately continues to have signs of cellulitis on the lower extremities bilaterally  especially on the left. Unfortunately he doesn't seem to be doing much better despite being on doxycycline for a couple of weeks. He was positive for MRSA as well as Enterobacter. Obviously the MRSA is treated by way of the doxycycline the Enterobacter unfortunately is not I'm going to see about initiating Bactrim for him at this point. 5/13; since he was last here he was hospitalized from 5/7 through 5/11. Mostly this was for congestive heart failure. He has an ejection fraction of 25 to 30%. His torsemide dose was increased. While he was there they dealt with the infection in the right leg. He did not take his Bactrim that he was prescribed last time. He was treated with linezolid in the hospital and cefepime and discharged on Bactrim to complete a 2-week course. He was supposed to follow-up with the Coumadin clinic at his cardiologist's office [Dr. End). He has not done this. He tells me he is on Coumadino 5 mg twice a day. The last INR I see is from 5/11 when  he was discharged from the hospital it was 2.1 however it was increasing every day they check this. He was thought to have cellulitis from methicillin-resistant staph aureus. [name] [name], [name] [name] ([id number]) 5/21; patient's wound on the right lower leg actually looks a lot better although not much change in size. We used Iodoflex under an Haematologist last week. The [employer] stayed in place. Objective Constitutional Sitting or standing Blood Pressure is within target range for patient.. Pulse regular and within target range for patient.[name] Respirations regular, non- labored and within target range.. Temperature is normal and within the target range for the patient.[name] appears in no distress. Vitals Time Taken: 7:59 AM, Height: 68 in, Weight: 400 lbs, BMI: 60.8, Temperature: 99 F, Pulse: 65 bpm, Respiratory Rate: 18 breaths/min, Blood Pressure: 132/71 mmHg. General Notes: Wound exam; severe bilateral lymphedema. On the right lateral leg surrounding inflammation does not look too bad. The wound itself is not really much different from last week however surface debrided mostly washed off with Anasept and gauze however I still had to use a #5 curette to clean up the wound surfaces. There was no bleeding. Integumentary (Hair, Skin) Wound #3 status is Healed - Epithelialized. Original cause of wound was Gradually Appeared. The wound is located on the Right,Proximal,Lateral Lower Leg. The wound measures 0cm length x 0cm width x 0cm depth; 0cm^2 area and 0cm^3 volume. Wound #5 status is Open. Original cause of wound was Gradually Appeared. The wound is located on the Left,Lateral Lower Leg. The wound measures 4.7cm length x 9cm width x 0.2cm depth; 33.222cm^2 area and 6.644cm^3 volume. There is Fat Layer (Subcutaneous Tissue) Exposed exposed. There is a large amount of serosanguineous drainage noted. The wound margin is flat and intact. There is small (1-33%) pink granulation within the wound bed. There is a  large (67-100%) amount of necrotic tissue within the wound bed including Adherent Slough. Wound #7 status is Open. Original cause of wound was Gradually Appeared. The wound is located on the Right,Anterior Lower Leg. The wound measures 5cm length x 12cm width x 0.1cm depth; 47.124cm^2 area and 4.712cm^3 volume. There is Fat Layer (Subcutaneous Tissue) Exposed exposed. There is a medium amount of serosanguineous drainage noted. The wound margin is flat and intact. There is small (1-33%) pink granulation within the wound bed. There is a large (67-100%) amount of necrotic tissue within the wound bed including Adherent Slough. Assessment Active Problems ICD-10 Venous insufficiency (chronic) (peripheral) Lymphedema, not  elsewhere classified Non-pressure chronic ulcer of other part of right lower leg with fat layer exposed Non-pressure chronic ulcer of other part of left lower leg with fat layer exposed Essential (primary) hypertension Chronic obstructive pulmonary disease, unspecified Long term (current) use of anticoagulants Paroxysmal atrial fibrillation Personal history of pulmonary embolism Chronic combined systolic (congestive) and diastolic (congestive) heart failure Procedures Wound #7 Pre-procedure diagnosis of Wound #7 is a Venous Leg Ulcer located on the Right,Anterior Lower Leg .Severity of Tissue Pre Debridement is: Fat layer exposed. There was a Selective/Open Wound Non-Viable Tissue Debridement with a total area of 28 sq cm performed by Ricard Dillon, MD. With the following instrument(s): Curette to remove Viable and Non-Viable tissue/material. Material removed includes Eschar and Slough and after achieving pain control using Lidocaine 4% Topical Solution. No specimens were taken. A time out was conducted at 08:42, prior to the start of the procedure. A Minimum amount of bleeding was controlled with Pressure. The procedure was tolerated well with a pain level of 0 throughout and  a pain level of 0 following the procedure. Post Debridement Measurements: 5cm length x 12cm width x 0.2cm depth; 9.425cm^3 volume. Character of Wound/Ulcer Post Debridement is improved. Severity of Tissue Post Debridement is: Fat layer exposed. Post procedure Diagnosis Wound #7: Same as Pre-Procedure [name] [name], [name] [name] ([id number]) Pre-procedure diagnosis of Wound #7 is a Venous Leg Ulcer located on the Right,Anterior Lower Leg . There was a Four Layer Compression Therapy Procedure by Montey Hora, RN. Post procedure Diagnosis Wound #7: Same as Pre-Procedure Wound #5 Pre-procedure diagnosis of Wound #5 is a Lymphedema located on the Left,Lateral Lower Leg . There was a Four Layer Compression Therapy Procedure by Montey Hora, RN. Post procedure Diagnosis Wound #5: Same as Pre-Procedure Plan Wound Cleansing: Wound #5 Left,Lateral Lower Leg: Cleanse wound with mild soap and water May shower with protection. Wound #7 Right,Anterior Lower Leg: Cleanse wound with mild soap and water May shower with protection. Primary Wound Dressing: Wound #5 Left,Lateral Lower Leg: Iodoflex Wound #7 Right,Anterior Lower Leg: Iodoflex Secondary Dressing: Wound #5 Left,Lateral Lower Leg: XtraSorb - where needed Wound #7 Right,Anterior Lower Leg: XtraSorb - where needed Dressing Change Frequency: Wound #5 Left,Lateral Lower Leg: Other: - twice weekly Wound #7 Right,Anterior Lower Leg: Other: - twice weekly Follow-up Appointments: Wound #5 Left,Lateral Lower Leg: Return Appointment in 1 week. Nurse Visit as needed Wound #7 Right,Anterior Lower Leg: Return Appointment in 1 week. Nurse Visit as needed Edema Control: Wound #5 Left,Lateral Lower Leg: 4 Layer Compression System - Bilateral Elevate legs to the level of the heart and pump ankles as often as possible Wound #7 Right,Anterior Lower Leg: 4 Layer Compression System - Bilateral Elevate legs to the level of the heart and pump ankles as  often as possible 1. The wound surface looks quite good although there was not much change in surface area. 2. Still using Iodoflex but I changed him to 4 layer compression today 3. Her case manager brought up the idea of compression pumps although I am really not convinced he would be compliant. If he would be compliant I would have no problem with it. Might discuss this with him at a later date 42. He should be completing Septra DS from the hospitalization. From an infection point of view all of this looks quite good. 5. I have advised stopping the Septra at this point there is no evidence of infection he has not gotten his INR checked Electronic Signature(s) Signed: 07/06/2019 8:52:51 AM By:  Linton Ham MD Entered By: Linton Ham on 07/06/2019 08:52:51 [name] [name] ([id number]) -------------------------------------------------------------------------------- SuperBill Details Patient Name: [name] [name] Date of Service: 07/06/2019 Medical Record Number: [id number] Patient Account Number: [id number] Date of Birth/Sex: 22-Nov-1960 (59 y.o. M) Treating RN: Montey Hora Primary Care Provider: Laverna Peace Other Clinician: Referring Provider: Laverna Peace Treating Provider/Extender: Tito Dine in Treatment: 9 Diagnosis Coding ICD-10 Codes Code Description I87.2 Venous insufficiency (chronic) (peripheral) I89.0 Lymphedema, not elsewhere classified L97.812 Non-pressure chronic ulcer of other part of right lower leg with fat layer exposed L97.822 Non-pressure chronic ulcer of other part of left lower leg with fat layer exposed [place] (primary) hypertension J44.9 Chronic obstructive pulmonary disease, unspecified Z79.01 Long term (current) use of anticoagulants I48.0 Paroxysmal atrial fibrillation Z86.711 Personal history of pulmonary embolism I50.42 Chronic combined systolic (congestive) and diastolic (congestive) heart failure Facility  Procedures CPT4 Code: 78024432 Description: [phone] - DEBRIDE WOUND 1ST 20 SQ CM OR < Modifier: Quantity: 1 CPT4 Code: Description: ICD-10 Diagnosis Description C08.387 Non-pressure chronic ulcer of other part of right lower leg with fat layer Modifier: exposed Quantity: CPT4 Code: 82807666 Description: 62312 - DEBRIDE WOUND EA ADDL 20 SQ CM Modifier: Quantity: 1 CPT4 Code: Description: ICD-10 Diagnosis Description F06.461 Non-pressure chronic ulcer of other part of right lower leg with fat layer Modifier: exposed Quantity: Physician Procedures CPT4 Code: 4012035 Description: 81362 - WC PHYS DEBR WO ANESTH 20 SQ CM Modifier: Quantity: 1 CPT4 Code: Description: ICD-10 Diagnosis Description Z22.616 Non-pressure chronic ulcer of other part of right lower leg with fat layer Modifier: exposed Quantity: CPT4 Code: [id number] Description: Fowler - WC PHYS DEBR WO ANESTH EA ADD 20 CM Modifier: Quantity: 1 CPT4 Code: Description: ICD-10 Diagnosis Description I68.937 Non-pressure chronic ulcer of other part of right lower leg with fat layer Modifier: exposed Quantity: Electronic Signature(s) Signed: 07/06/2019 12:54:43 PM By: Linton Ham MD Entered By: Linton Ham on 07/06/2019 08:53:15

## 2019-07-06 NOTE — Progress Notes (Signed)
[name] [name], [name] [name] ([id number]) Visit Report for 07/06/2019 Arrival Information Details Patient Name: [name] [name], [name] [name] Date of Service: 07/06/2019 8:00 AM Medical Record Number: [id number] Patient Account Number: [id number] Date of Birth/Sex: [birth year] (59 y.o. M) Treating RN: Huel Coventry Primary Care Etrulia Zarr: Marvell Fuller Other Clinician: Referring Milani Lowenstein: Marvell Fuller Treating Fowler Antos/Extender: Altamese [place] in Treatment: 9 Visit Information History Since Last Visit Added or deleted any medications: No Patient Arrived: Ambulatory Any new allergies or adverse reactions: No Arrival Time: 07:56 Had a fall or experienced change in No Accompanied By: self activities of daily living that may affect Transfer Assistance: None risk of falls: Patient Identification Verified: Yes Signs or symptoms of abuse/neglect since last visito No Secondary Verification Process Completed: Yes Hospitalized since last visit: No Patient Has Alerts: Yes Implantable device outside of the clinic excluding No Patient Alerts: Patient on Blood Thinner cellular tissue based products placed in the center Warfarin since last visit: Pain Present Now: No Electronic Signature(s) Signed: 07/06/2019 1:58:14 PM By: Elliot Gurney, BSN, RN, CWS, Kim RN, BSN Entered By: Elliot Gurney, BSN, RN, CWS, Kim on 07/06/2019 07:59:17 [name] [name] ([id number]) -------------------------------------------------------------------------------- Compression Therapy Details Patient Name: [name] [name] Date of Service: 07/06/2019 8:00 AM Medical Record Number: [id number] Patient Account Number: [id number] Date of Birth/Sex: 11-Sep-1960 (59 y.o. M) Treating RN: Curtis Sites Primary Care Lu Paradise: Marvell Fuller Other Clinician: Referring Lenea Bywater: Marvell Fuller Treating Rithvik Orcutt/Extender: Altamese [place] in Treatment: 9 Compression Therapy Performed for Wound Assessment: Wound #5 Left,Lateral Lower  Leg Performed By: Clinician Curtis Sites, RN Compression Type: Four Layer Post Procedure Diagnosis Same as Pre-procedure Electronic Signature(s) Signed: 07/06/2019 1:36:47 PM By: Curtis Sites Entered By: Curtis Sites on 07/06/2019 08:45:49 [name] [name] ([id number]) -------------------------------------------------------------------------------- Compression Therapy Details Patient Name: [name] [name] Date of Service: 07/06/2019 8:00 AM Medical Record Number: [id number] Patient Account Number: [id number] Date of Birth/Sex: 22-Apr-1960 (59 y.o. M) Treating RN: Curtis Sites Primary Care Dorreen Valiente: Marvell Fuller Other Clinician: Referring Froilan Mclean: Marvell Fuller Treating Porsha Skilton/Extender: Altamese [place] in Treatment: 9 Compression Therapy Performed for Wound Assessment: Wound #7 Right,Anterior Lower Leg Performed By: Clinician Curtis Sites, RN Compression Type: Four Layer Post Procedure Diagnosis Same as Pre-procedure Electronic Signature(s) Signed: 07/06/2019 1:36:47 PM By: Curtis Sites Entered By: Curtis Sites on 07/06/2019 08:45:49 [name] [name] ([id number]) -------------------------------------------------------------------------------- Encounter Discharge Information Details Patient Name: [name] [name] Date of Service: 07/06/2019 8:00 AM Medical Record Number: [id number] Patient Account Number: [id number] Date of Birth/Sex: 17-Jan-1961 (59 y.o. M) Treating RN: Curtis Sites Primary Care Doroteo Nickolson: Marvell Fuller Other Clinician: Referring Jezlyn Westerfield: Marvell Fuller Treating Ramy Greth/Extender: Altamese [place] in Treatment: 9 Encounter Discharge Information Items Post Procedure Vitals Discharge Condition: Stable Temperature (F): 99 Ambulatory Status: Wheelchair Pulse (bpm): 65 Discharge Destination: Home Respiratory Rate (breaths/min): 16 Transportation: Private Auto Blood Pressure (mmHg): 132/71 Accompanied By:  self Schedule Follow-up Appointment: Yes Clinical Summary of Care: Electronic Signature(s) Signed: 07/06/2019 1:36:47 PM By: Curtis Sites Entered By: Curtis Sites on 07/06/2019 08:48:44 [name] [name] ([id number]) -------------------------------------------------------------------------------- Lower Extremity Assessment Details Patient Name: [name] [name] Date of Service: 07/06/2019 8:00 AM Medical Record Number: [id number] Patient Account Number: [id number] Date of Birth/Sex: 1960-12-26 (59 y.o. M) Treating RN: Huel Coventry Primary Care Bonnell Placzek: Marvell Fuller Other Clinician: Referring Kruti Horacek: Marvell Fuller Treating Rendy Lazard/Extender: Altamese [place] in Treatment: 9 Edema Assessment Assessed: [Left: No] [Right: No] [Left: Edema] [Right: :] Calf Left: Right: Point of Measurement: 31 cm From Medial Instep 57 cm 54.5 cm Ankle Left: Right: Point of Measurement: 13 cm From Medial Instep  35.5 cm 36 cm Vascular Assessment Pulses: Dorsalis Pedis Palpable: [Left:Yes] [Right:Yes] Electronic Signature(s) Signed: 07/06/2019 1:58:14 PM By: Elliot Gurney, BSN, RN, CWS, Kim RN, BSN Entered By: Elliot Gurney, BSN, RN, CWS, Kim on 07/06/2019 08:12:10 [name] [name], [name] [name] ([id number]) -------------------------------------------------------------------------------- Multi Wound Chart Details Patient Name: [name] [name] Date of Service: 07/06/2019 8:00 AM Medical Record Number: [id number] Patient Account Number: [id number] Date of Birth/Sex: Jan 07, 1961 (59 y.o. 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 Comorbid History: N/A Asthma, Chronic Obstructive Asthma, Chronic Obstructive Pulmonary Disease (COPD), Sleep Pulmonary Disease (COPD), Sleep Apnea, Arrhythmia, Congestive Apnea, Arrhythmia, Congestive Heart Failure, Hypertension, Heart Failure, Hypertension, Myocardial Infarction, Type II Myocardial Infarction, Type II Diabetes Diabetes Date Acquired: 02/16/2019 02/12/2019 05/17/2019 Weeks of Treatment: 9 9 4  Wound Status: Healed - Epithelialized Open Open Clustered Wound: No Yes No Clustered Quantity: N/A 2 N/A Measurements L x W x D (cm) 0x0x0 4.7x9x0.2 5x12x0.1 Area (cm) : 0 33.222 47.124 Volume (cm) : 0 6.644 4.712 % Reduction in Area: 100.00% -67.90% 38.80% % Reduction in Volume: 100.00% -235.70% 69.40% Classification: Grade 1 Full Thickness Without Exposed Full Thickness Without Exposed Support Structures Support Structures Exudate Amount: N/A Large Medium Exudate Type: N/A Serosanguineous Serosanguineous Exudate Color: N/A red, brown red, brown Wound Margin: N/A Flat and Intact Flat and Intact Granulation Amount: N/A Small (1-33%) Small (1-33%) Granulation Quality: N/A Pink Pink Necrotic Amount: N/A Large (67-100%) Large (67-100%) Epithelialization: N/A None None Debridement: N/A N/A Debridement - Excisional Pre-procedure Verification/Time N/A N/A 08:42 Out Taken: Pain Control: N/A N/A Lidocaine 4% Topical Solution Tissue Debrided: N/A N/A Subcutaneous, Slough Level: N/A N/A Skin/Subcutaneous Tissue Debridement Area (sq cm): N/A N/A 28 Instrument: N/A N/A Curette Bleeding: N/A N/A Minimum Hemostasis Achieved: N/A N/A Pressure Procedural Pain: N/A N/A 0 Post Procedural Pain: N/A N/A 0 [name] [name], [name] [name] ( ) Debridement Treatment N/A N/A  Procedure was tolerated well Response: Post Debridement N/A N/A 5x12x0.2 Measurements L x W x D (cm) Post Debridement Volume: N/A N/A 9.425 (cm) Procedures Performed: N/A Compression Therapy Compression Therapy Debridement Treatment Notes Electronic Signature(s) Signed: 07/06/2019 12:54:43 PM By: 07/08/2019 MD Entered By: Baltazar Najjar on 07/06/2019 08:46:18 07/08/2019 ([name] [name]) -------------------------------------------------------------------------------- Multi-Disciplinary Care Plan Details Patient Name: [id number] Date of Service: 07/06/2019 8:00 AM Medical Record Number: 07/08/2019 Patient Account Number: [id number] Date of Birth/Sex: 30-Nov-1960 (59 y.o. M) Treating RN: 41 Primary Care Laydon Martis: Curtis Sites Other Clinician: Referring Dominic Rhome: Marvell Fuller Treating Hansika Leaming/Extender: Marvell Fuller in Treatment: 9 Active Inactive Abuse / Safety / Falls / Self Care Management Nursing Diagnoses: Potential for falls Goals: Patient will remain injury free related to falls Date Initiated: 05/04/2019 Target Resolution Date: 07/21/2019 Goal Status: Active Interventions: Assess fall risk on admission and as needed Provide education on basic hygiene Notes: Nutrition Nursing Diagnoses: Impaired glucose control: actual or potential Goals: Patient/caregiver agrees to and verbalizes understanding of need to use nutritional supplements and/or vitamins as prescribed Date Initiated: 05/04/2019 Target Resolution Date: 07/21/2019 Goal Status: Active Interventions: Assess patient nutrition upon admission and as needed per policy Notes: Orientation to the Wound  Care Program Nursing Diagnoses: Knowledge deficit related to the wound healing center program Goals: Patient/caregiver will verbalize understanding of the [place] Program Date Initiated: 05/04/2019 Target Resolution Date: 07/21/2019 Goal Status:  Active Interventions: Provide education on orientation to the wound center Notes: Venous Leg Ulcer Nursing Diagnoses: Potential for venous Insuffiency (use before diagnosis confirmed) Goals: Patient will maintain optimal edema control Date Initiated: 05/04/2019 Target Resolution Date: 07/21/2019 Goal Status: Active [name] [name], [name] [name] ([id number]) Interventions: Compression as ordered Notes: Wound/Skin Impairment Nursing Diagnoses: Impaired tissue integrity Goals: Ulcer/skin breakdown will heal within 14 weeks Date Initiated: 05/04/2019 Target Resolution Date: 07/21/2019 Goal Status: Active Interventions: Assess patient/caregiver ability to obtain necessary supplies Assess patient/caregiver ability to perform ulcer/skin care regimen upon admission and as needed Assess ulceration(s) every visit Notes: Electronic Signature(s) Signed: 07/06/2019 1:36:47 PM By: Montey Hora Entered By: Montey Hora on 07/06/2019 08:39:48 [name] [name] ([id number]) -------------------------------------------------------------------------------- Pain Assessment Details Patient Name: [name] [name] Date of Service: 07/06/2019 8:00 AM Medical Record Number: [id number] Patient Account Number: [id number] Date of Birth/Sex: [birth year] (59 y.o. M) Treating RN: Cornell Barman Primary Care Kirsty Monjaraz: Laverna Peace Other Clinician: Referring Jonnette Nuon: Laverna Peace Treating Pavlos Yon/Extender: Tito Dine in Treatment: 9 Active Problems Location of Pain Severity and Description of Pain Patient Has Paino Yes Site Locations Pain Location: Generalized Pain Pain Management and Medication Current Pain Management: Notes Patient states he hurts all over. Legs are burning and throbbing. Electronic Signature(s) Signed: 07/06/2019 1:58:14 PM By: Gretta Cool, BSN, RN, CWS, Kim RN, BSN Entered By: Gretta Cool, BSN, RN, CWS, Kim on 07/06/2019 08:00:56 [name] [name]  ([id number]) -------------------------------------------------------------------------------- Patient/Caregiver Education Details Patient Name: [name] [name] Date of Service: 07/06/2019 8:00 AM Medical Record Number: [id number] Patient Account Number: [id number] Date of Birth/Gender: 02/02/1961 (59 y.o. M) Treating RN: Montey Hora Primary Care Physician: Laverna Peace Other Clinician: Referring Physician: Laverna Peace Treating Physician/Extender: Tito Dine in Treatment: 9 Education Assessment Education Provided To: Patient Education Topics Provided Venous: Handouts: Other: leg elevation Methods: Explain/Verbal Responses: State content correctly Electronic Signature(s) Signed: 07/06/2019 1:36:47 PM By: Montey Hora Entered By: Montey Hora on 07/06/2019 08:47:25 [name] [name] ([id number]) -------------------------------------------------------------------------------- Wound Assessment Details Patient Name: [name] [name] Date of Service: 07/06/2019 8:00 AM Medical Record Number: [id number] Patient Account Number: [id number] Date of Birth/Sex: 1960-08-16 (59 y.o. M) Treating RN: Cornell Barman Primary Care Marwin Primmer: Laverna Peace Other Clinician: Referring Dominie Benedick: Laverna Peace Treating Brittain Smithey/Extender: Tito Dine in Treatment: 9 Wound Status Wound Number: 3 Primary Etiology: Diabetic Wound/Ulcer of the Lower Extremity Wound Location: Right, Proximal, Lateral Lower Leg Wound Status: Healed - Epithelialized Wounding Event: Gradually Appeared Date Acquired: 02/16/2019 Weeks Of Treatment: 9 Clustered Wound: No Wound Measurements Length: (cm) 0 Width: (cm) 0 Depth: (cm) 0 Area: (cm) 0 Volume: (cm) 0 % Reduction in Area: 100% % Reduction in Volume: 100% Wound Description Classification: Grade 1 Electronic Signature(s) Signed: 07/06/2019 1:58:14 PM By: Gretta Cool, BSN, RN, CWS, Kim RN, BSN Entered By: Gretta Cool, BSN, RN,  CWS, Kim on 07/06/2019 08:08:20 [name] [name] ([id number]) -------------------------------------------------------------------------------- Wound Assessment Details Patient Name: [name] [name] Date of Service: 07/06/2019 8:00 AM Medical Record Number: [id number] Patient Account Number: [id number] Date of Birth/Sex: [birth year] (59 y.o. M) Treating RN: Cornell Barman Primary Care Sarae Nicholes: Laverna Peace Other Clinician: Referring Ayleah Hofmeister: Laverna Peace Treating Tiger Spieker/Extender: Tito Dine in Treatment: 9 Wound Status Wound Number: 5 Primary Lymphedema Etiology: Wound Location: Left, Lateral Lower Leg Wound Open Wounding Event: Gradually Appeared Status: Date Acquired: 02/12/2019 Comorbid Asthma, Chronic Obstructive Pulmonary Disease (  COPD), Weeks Of Treatment: 9 History: Sleep Apnea, Arrhythmia, Congestive Heart Failure, Clustered Wound: Yes Hypertension, Myocardial Infarction, Type II Diabetes Photos Wound Measurements Length: (cm) 4.7 Width: (cm) 9 Depth: (cm) 0.2 Clustered Quantity: 2 Area: (cm) 33.222 Volume: (cm) 6.644 % Reduction in Area: -67.9% % Reduction in Volume: -235.7% Epithelialization: None Wound Description Classification: Full Thickness Without Exposed Support Struc Wound Margin: Flat and Intact Exudate Amount: Large Exudate Type: Serosanguineous Exudate Color: red, brown tures Foul Odor After Cleansing: No Slough/Fibrino Yes Wound Bed Granulation Amount: Small (1-33%) Exposed Structure Granulation Quality: Pink Fascia Exposed: No Necrotic Amount: Large (67-100%) Fat Layer (Subcutaneous Tissue) Exposed: Yes Necrotic Quality: Adherent Slough Tendon Exposed: No Muscle Exposed: No Joint Exposed: No Bone Exposed: No Treatment Notes Wound #5 (Left, Lateral Lower Leg) Notes iodoflex, xsorb, 4 layer bilateral [name] [name], [name] [name] ([id number]) Electronic Signature(s) Signed: 07/06/2019 1:58:14 PM By: Elliot Gurney, BSN, RN, CWS, Kim RN,  BSN Entered By: Elliot Gurney, BSN, RN, CWS, Kim on 07/06/2019 08:10:03 [name] [name] ([id number]) -------------------------------------------------------------------------------- Wound Assessment Details Patient Name: [name] [name] Date of Service: 07/06/2019 8:00 AM Medical Record Number: [id number] Patient Account Number: [id number] Date of Birth/Sex: 1960-07-10 (59 y.o. M) Treating RN: Huel Coventry Primary Care Tameka Hoiland: Marvell Fuller Other Clinician: Referring Said Rueb: Marvell Fuller Treating Anum Palecek/Extender: Altamese [place] in Treatment: 9 Wound Status Wound Number: 7 Primary Venous Leg Ulcer Etiology: Wound Location: Right, Anterior Lower Leg Wound Open Wounding Event: Gradually Appeared Status: Date Acquired: 05/17/2019 Comorbid Asthma, Chronic Obstructive Pulmonary Disease (COPD), Weeks Of Treatment: 4 History: Sleep Apnea, Arrhythmia, Congestive Heart Failure, Clustered Wound: No Hypertension, Myocardial Infarction, Type II Diabetes Photos Wound Measurements Length: (cm) 5 Width: (cm) 12 Depth: (cm) 0.1 Area: (cm) 47.124 Volume: (cm) 4.712 % Reduction in Area: 38.8% % Reduction in Volume: 69.4% Epithelialization: None Wound Description Classification: Full Thickness Without Exposed Support Struc Wound Margin: Flat and Intact Exudate Amount: Medium Exudate Type: Serosanguineous Exudate Color: red, brown tures Foul Odor After Cleansing: No Slough/Fibrino Yes Wound Bed Granulation Amount: Small (1-33%) Exposed Structure Granulation Quality: Pink Fascia Exposed: No Necrotic Amount: Large (67-100%) Fat Layer (Subcutaneous Tissue) Exposed: Yes Necrotic Quality: Adherent Slough Tendon Exposed: No Muscle Exposed: No Joint Exposed: No Bone Exposed: No Treatment Notes Wound #7 (Right, Anterior Lower Leg) Notes iodoflex, xsorb, 4 layer bilateral Electronic Signature(sDASHAN, [name] [name] ([id number]) Signed: 07/06/2019 1:58:14 PM By: Elliot Gurney, BSN,  RN, CWS, Kim RN, BSN Entered By: Elliot Gurney, BSN, RN, CWS, Kim on 07/06/2019 08:10:27 [name] [name] ([id number]) -------------------------------------------------------------------------------- Vitals Details Patient Name: [name] [name] Date of Service: 07/06/2019 8:00 AM Medical Record Number: [id number] Patient Account Number: [id number] Date of Birth/Sex: November 27, 1960 (59 y.o. M) Treating RN: Huel Coventry Primary Care Mylinh Cragg: Marvell Fuller Other Clinician: Referring Aaron Boeh: Marvell Fuller Treating Sanja Elizardo/Extender: Altamese [place] in Treatment: 9 Vital Signs Time Taken: 07:59 Temperature (F): 99 Height (in): 68 Pulse (bpm): 65 Weight (lbs): 400 Respiratory Rate (breaths/min): 18 Body Mass Index (BMI): 60.8 Blood Pressure (mmHg): 132/71 Reference Range: 80 - 120 mg / dl Electronic Signature(s) Signed: 07/06/2019 1:58:14 PM By: Elliot Gurney, BSN, RN, CWS, Kim RN, BSN Entered By: Elliot Gurney, BSN, RN, CWS, Kim on 07/06/2019 08:00:17

## 2019-07-09 ENCOUNTER — Ambulatory Visit: Payer: Self-pay | Admitting: *Deleted

## 2019-07-09 DIAGNOSIS — F321 Major depressive disorder, single episode, moderate: Secondary | ICD-10-CM | POA: Diagnosis not present

## 2019-07-09 DIAGNOSIS — E1151 Type 2 diabetes mellitus with diabetic peripheral angiopathy without gangrene: Secondary | ICD-10-CM | POA: Diagnosis not present

## 2019-07-09 DIAGNOSIS — M10041 Idiopathic gout, right hand: Secondary | ICD-10-CM | POA: Diagnosis not present

## 2019-07-09 DIAGNOSIS — I890 Lymphedema, not elsewhere classified: Secondary | ICD-10-CM | POA: Diagnosis not present

## 2019-07-09 DIAGNOSIS — G894 Chronic pain syndrome: Secondary | ICD-10-CM | POA: Diagnosis not present

## 2019-07-09 DIAGNOSIS — M545 Low back pain: Secondary | ICD-10-CM | POA: Diagnosis not present

## 2019-07-09 DIAGNOSIS — Z7901 Long term (current) use of anticoagulants: Secondary | ICD-10-CM | POA: Diagnosis not present

## 2019-07-09 DIAGNOSIS — G510 Bell's palsy: Secondary | ICD-10-CM | POA: Diagnosis not present

## 2019-07-09 DIAGNOSIS — Z792 Long term (current) use of antibiotics: Secondary | ICD-10-CM | POA: Diagnosis not present

## 2019-07-09 DIAGNOSIS — I872 Venous insufficiency (chronic) (peripheral): Secondary | ICD-10-CM | POA: Diagnosis not present

## 2019-07-09 DIAGNOSIS — D649 Anemia, unspecified: Secondary | ICD-10-CM | POA: Diagnosis not present

## 2019-07-09 DIAGNOSIS — I5022 Chronic systolic (congestive) heart failure: Secondary | ICD-10-CM | POA: Diagnosis not present

## 2019-07-09 DIAGNOSIS — J449 Chronic obstructive pulmonary disease, unspecified: Secondary | ICD-10-CM

## 2019-07-09 DIAGNOSIS — G8194 Hemiplegia, unspecified affecting left nondominant side: Secondary | ICD-10-CM | POA: Diagnosis not present

## 2019-07-09 DIAGNOSIS — I2510 Atherosclerotic heart disease of native coronary artery without angina pectoris: Secondary | ICD-10-CM | POA: Diagnosis not present

## 2019-07-09 DIAGNOSIS — I4819 Other persistent atrial fibrillation: Secondary | ICD-10-CM | POA: Diagnosis not present

## 2019-07-09 DIAGNOSIS — Z86718 Personal history of other venous thrombosis and embolism: Secondary | ICD-10-CM | POA: Diagnosis not present

## 2019-07-09 DIAGNOSIS — M1611 Unilateral primary osteoarthritis, right hip: Secondary | ICD-10-CM | POA: Diagnosis not present

## 2019-07-09 DIAGNOSIS — E662 Morbid (severe) obesity with alveolar hypoventilation: Secondary | ICD-10-CM | POA: Diagnosis not present

## 2019-07-09 DIAGNOSIS — E785 Hyperlipidemia, unspecified: Secondary | ICD-10-CM | POA: Diagnosis not present

## 2019-07-09 DIAGNOSIS — L97811 Non-pressure chronic ulcer of other part of right lower leg limited to breakdown of skin: Secondary | ICD-10-CM | POA: Diagnosis not present

## 2019-07-09 DIAGNOSIS — L97821 Non-pressure chronic ulcer of other part of left lower leg limited to breakdown of skin: Secondary | ICD-10-CM | POA: Diagnosis not present

## 2019-07-09 DIAGNOSIS — Z6841 Body Mass Index (BMI) 40.0 and over, adult: Secondary | ICD-10-CM | POA: Diagnosis not present

## 2019-07-09 DIAGNOSIS — I110 Hypertensive heart disease with heart failure: Secondary | ICD-10-CM | POA: Diagnosis not present

## 2019-07-09 DIAGNOSIS — G4733 Obstructive sleep apnea (adult) (pediatric): Secondary | ICD-10-CM | POA: Diagnosis not present

## 2019-07-09 NOTE — Telephone Encounter (Signed)
Spoke with patient on the phone and reassured him that we are actively working on home care orders and assessment. Patient ask if we can but a rush on this? KW

## 2019-07-09 NOTE — Telephone Encounter (Signed)
Pt has called back again this am regarding this. States that he must have in home help and would someone pls reach out to him today re his request for in home assistance FU is [phone]

## 2019-07-09 NOTE — Chronic Care Management (AMB) (Signed)
  Care Management   Note   Name: [name] [name] MRN: [id number] DOB: 13-Jun-1960   Brief outreach with Mr. Timson. Reports that he has transferred Cardiology and Primary Care to [facility]. Reports his assigned Scientist, research (medical) assisted with arranging initial appointments. Reports needing a referral but unable to recall specific information requested.   PLAN Will contact the [facility] regarding referral request. Will update PCP and care management team.   Katha Cabal [facility] Care Management [phone]

## 2019-07-09 NOTE — Chronic Care Management (AMB) (Signed)
Care Management    Clinical Social Work Follow Up Note  07/09/2019 Name: Andrw Mcguirt MRN: [id number] DOB: [birth year]  Krrish Freund is a 59 y.o. year old male who is a primary care patient of Flinchum, Kelby Aline, FNP. The CCM team was consulted for assistance with [name] .   Review of patient status, including review of consultants reports, other relevant assessments, and collaboration with appropriate care team members and the patient's provider was performed as part of comprehensive patient evaluation and provision of chronic care management services.    SDOH (Social Determinants of Health) assessments performed: No    Outpatient Encounter Medications as of 07/09/2019  Medication Sig  . albuterol (VENTOLIN HFA) 108 (90 Base) MCG/ACT inhaler Inhale 2 puffs into the lungs every 6 (six) hours as needed for wheezing or shortness of breath.  . allopurinol (ZYLOPRIM) 100 MG tablet Take 1 tablet (100 mg total) by mouth daily.  [name] ALPRAZolam (XANAX) 0.5 MG tablet Take 1 tablet (0.5 mg total) by mouth 2 (two) times daily as needed for anxiety or sleep.  . colchicine 0.6 MG tablet 1 tab PO q 12 hrs as needed until gout flare subsides, schedule on office follow up (Patient taking differently: Take 0.6 mg by mouth daily as needed. )  . DULoxetine (CYMBALTA) 20 MG capsule Take 1 capsule (20 mg total) by mouth daily.  . ferrous sulfate 325 (65 FE) MG tablet Take 1 tablet (325 mg total) by mouth 2 (two) times daily with a meal.  . gabapentin (NEURONTIN) 300 MG capsule Take 1 capsule (300 mg total) by mouth 3 (three) times daily.  [name] HYDROcodone-acetaminophen (NORCO/VICODIN) 5-325 MG tablet Take 1-2 tablets by mouth every 6 (six) hours as needed for moderate pain or severe pain.  [name] levothyroxine (SYNTHROID) 50 MCG tablet Take 1 tablet (50 mcg total) by mouth daily before breakfast. Need lab work beginning of June for Thyroid  . lisinopril (ZESTRIL) 2.5 MG tablet Take 1 tablet (2.5 mg  total) by mouth daily.  [name] loperamide (IMODIUM) 2 MG capsule Take 1 capsule (2 mg total) by mouth as needed for diarrhea or loose stools.  . metoprolol succinate (TOPROL-XL) 50 MG 24 hr tablet Take 1 tablet (50 mg total) by mouth daily. Take with or immediately following a meal.  . nitroGLYCERIN (NITROSTAT) 0.4 MG SL tablet Place 1 tablet under tongue every 5 minutes as needed for chest pain. (No more than 3 doses within 15 minutes)  . torsemide (DEMADEX) 20 MG tablet Take 4 tablets (80 mg total) by mouth 2 (two) times daily.  . traZODone (DESYREL) 150 MG tablet TAKE 1 TABLET BY MOUTH AT BEDTIME AS NEEDED SLEEP  . warfarin (COUMADIN) 5 MG tablet Take 1 tablet (5 mg total) by mouth daily.   No facility-administered encounter medications on file as of 07/09/2019.     Goals Addressed            This Visit's Progress   . "I was denied more aid service, I need a aid (pt-stated)       CARE PLAN ENTRY (see longtitudinal plan of care for additional care plan information)  Current Barriers:  . ADL IADL limitations  Clinical Social Work Clinical Goal(s):  [name] Over the next 90 days, patient will work with [facility] to address needs related to be denied continued in home aid services  Interventions: . Confirmed with patient that he will now be followed by the [facility] . Provided positive reinforcement in  regards to switching providers to increase adherence.  . Emphasized the importance of keeping his appointments and allowing HH to come in to work with him to avoid being dismissed from the service . CSW confirmed that request form for personal care services through [facility] was emailed to patient's provider Marcelino Duster Flinchum, FNP) to complete and return  . Confirmed that patient has called to follow up as well   Patient Self Care Activities:  . Patient verbalizes understanding of plan to contact [employer] to file an appeal for in home services . Unable to  perform ADLs independently  Please see past updates related to this goal by clicking on the "Past Updates" button in the selected goal      . 'I need a wheelchair lift for my minivan" (pt-stated)       CARE PLAN ENTRY (see longitudinal plan of care for additional care plan information)  Current Barriers:  . Financial constraints related to need for a wheelchair lift for his car  Clinical Social Work Clinical Goal(s):  [name] Over the next 90 days, patient will work with the disability advocacy group to address needs related to need for a wheelchair lift  Interventions: . Inter-disciplinary care team collaboration (see longitudinal plan of care) . Confirmed with patient that he has now switched providers [facility])  . Confirmed that the advocacy group, [name] continues to work on wheelchair lift for patient . Patient confirmed that he has spoken with Seward Grater and recommended that he contact her periodically to follow up on request  Patient Self Care Activities:  . Patient verbalizes understanding of plan to follow up with the advocacy group for assistance for a wheelchair lift . Lack of knowledge of available resources to obtain a wheelchair lift  Please see past updates related to this goal by clicking on the "Past Updates" button in the selected goal          Follow Up Plan: Client will follow up with [facility] clinic for primary and specialty care    [place], LCSW Clinical Social Worker  [facility] Care Management [phone]

## 2019-07-10 ENCOUNTER — Telehealth: Payer: Self-pay

## 2019-07-10 ENCOUNTER — Ambulatory Visit
Admission: RE | Admit: 2019-07-10 | Discharge: 2019-07-10 | Disposition: A | Discharge status: Home / Self Care | Payer: Medicaid Other | Source: Ambulatory Visit | Attending: Adult Health | Admitting: Adult Health

## 2019-07-10 ENCOUNTER — Other Ambulatory Visit: Payer: Self-pay

## 2019-07-10 DIAGNOSIS — H539 Unspecified visual disturbance: Secondary | ICD-10-CM | POA: Diagnosis not present

## 2019-07-10 DIAGNOSIS — R29810 Facial weakness: Secondary | ICD-10-CM

## 2019-07-10 DIAGNOSIS — I4819 Other persistent atrial fibrillation: Secondary | ICD-10-CM

## 2019-07-10 DIAGNOSIS — G319 Degenerative disease of nervous system, unspecified: Secondary | ICD-10-CM | POA: Insufficient documentation

## 2019-07-10 DIAGNOSIS — S0990XA Unspecified injury of head, initial encounter: Secondary | ICD-10-CM

## 2019-07-10 DIAGNOSIS — R519 Headache, unspecified: Secondary | ICD-10-CM | POA: Diagnosis not present

## 2019-07-10 DIAGNOSIS — W19XXXA Unspecified fall, initial encounter: Secondary | ICD-10-CM | POA: Diagnosis not present

## 2019-07-10 DIAGNOSIS — R4781 Slurred speech: Secondary | ICD-10-CM | POA: Diagnosis not present

## 2019-07-10 DIAGNOSIS — I4891 Unspecified atrial fibrillation: Secondary | ICD-10-CM

## 2019-07-10 DIAGNOSIS — R531 Weakness: Secondary | ICD-10-CM

## 2019-07-10 DIAGNOSIS — R29818 Other symptoms and signs involving the nervous system: Secondary | ICD-10-CM | POA: Diagnosis not present

## 2019-07-10 DIAGNOSIS — Z7901 Long term (current) use of anticoagulants: Secondary | ICD-10-CM | POA: Diagnosis not present

## 2019-07-10 DIAGNOSIS — Z5329 Procedure and treatment not carried out because of patient's decision for other reasons: Secondary | ICD-10-CM | POA: Diagnosis not present

## 2019-07-10 DIAGNOSIS — Z6841 Body Mass Index (BMI) 40.0 and over, adult: Secondary | ICD-10-CM | POA: Insufficient documentation

## 2019-07-10 DIAGNOSIS — G8929 Other chronic pain: Secondary | ICD-10-CM

## 2019-07-10 DIAGNOSIS — Z91199 Patient's noncompliance with other medical treatment and regimen due to unspecified reason: Secondary | ICD-10-CM

## 2019-07-10 NOTE — Telephone Encounter (Signed)
Left message for patient to call back, okay for [facility] triage to advise patient. KW

## 2019-07-10 NOTE — Progress Notes (Signed)
Chronic atrophic/ ischemic changes notes on CT of head without any acute abnormality. MRI showed the same. No intracranial bleed.  He should keep his follow up with neurology and report any changes.  He is overdue for visit in this office as well.   Forwarded results to neurologist as well.

## 2019-07-10 NOTE — Telephone Encounter (Signed)
Copied from CRM [id number]. Topic: General - Inquiry >> Jul 10, 2019  3:14 PM Deborha Payment wrote: Reason for CRM: Kennith Center from [facility] called regarding patient referral paper work, Kennith Center would like to know the patients health care worker name and get her contact information Call back [phone]

## 2019-07-10 NOTE — Telephone Encounter (Signed)
-----   Message from Berniece Pap, FNP sent at 07/10/2019  2:44 PM EDT ----- Chronic atrophic/ ischemic changes notes on CT of head without any acute abnormality. MRI showed the same. No intracranial bleed.  He should keep his follow up with neurology and report any changes.  He is overdue for visit in this office as well.   Forwarded results to neurologist as well.

## 2019-07-10 NOTE — Telephone Encounter (Signed)
Please see patient response below and nurse message. KW

## 2019-07-10 NOTE — Telephone Encounter (Signed)
Form was filled out and faxed 07/10/19

## 2019-07-10 NOTE — Telephone Encounter (Signed)
Result note read to pt, verbalizes understanding of results but had multiple questions regarding appts . Did not know what neurologist he sees or if he had a f/u appt already scheduled. I did note visits from neurology at [facility] , [facility]  and provided number to clinic. Also questioning if GI referral was called in "To help me lose weight." Asking if home health was scheduled to come to his home "For help with bathing, cleaning, shopping." Did note an encounter with [facility] and assured pt in process. Pt did not want to schedule office visit at this time. States will call back.

## 2019-07-11 ENCOUNTER — Ambulatory Visit: Payer: Medicaid Other | Admitting: Physician Assistant

## 2019-07-11 ENCOUNTER — Ambulatory Visit: Payer: Self-pay | Admitting: *Deleted

## 2019-07-11 DIAGNOSIS — G894 Chronic pain syndrome: Secondary | ICD-10-CM

## 2019-07-11 DIAGNOSIS — I2510 Atherosclerotic heart disease of native coronary artery without angina pectoris: Secondary | ICD-10-CM | POA: Diagnosis not present

## 2019-07-11 DIAGNOSIS — E1151 Type 2 diabetes mellitus with diabetic peripheral angiopathy without gangrene: Secondary | ICD-10-CM | POA: Diagnosis not present

## 2019-07-11 DIAGNOSIS — M1611 Unilateral primary osteoarthritis, right hip: Secondary | ICD-10-CM | POA: Diagnosis not present

## 2019-07-11 DIAGNOSIS — I872 Venous insufficiency (chronic) (peripheral): Secondary | ICD-10-CM | POA: Diagnosis not present

## 2019-07-11 DIAGNOSIS — G8194 Hemiplegia, unspecified affecting left nondominant side: Secondary | ICD-10-CM | POA: Diagnosis not present

## 2019-07-11 DIAGNOSIS — M545 Low back pain: Secondary | ICD-10-CM | POA: Diagnosis not present

## 2019-07-11 DIAGNOSIS — Z792 Long term (current) use of antibiotics: Secondary | ICD-10-CM | POA: Diagnosis not present

## 2019-07-11 DIAGNOSIS — L97811 Non-pressure chronic ulcer of other part of right lower leg limited to breakdown of skin: Secondary | ICD-10-CM | POA: Diagnosis not present

## 2019-07-11 DIAGNOSIS — Z7901 Long term (current) use of anticoagulants: Secondary | ICD-10-CM | POA: Diagnosis not present

## 2019-07-11 DIAGNOSIS — L97821 Non-pressure chronic ulcer of other part of left lower leg limited to breakdown of skin: Secondary | ICD-10-CM | POA: Diagnosis not present

## 2019-07-11 DIAGNOSIS — D649 Anemia, unspecified: Secondary | ICD-10-CM | POA: Diagnosis not present

## 2019-07-11 DIAGNOSIS — I110 Hypertensive heart disease with heart failure: Secondary | ICD-10-CM | POA: Diagnosis not present

## 2019-07-11 DIAGNOSIS — Z86718 Personal history of other venous thrombosis and embolism: Secondary | ICD-10-CM | POA: Diagnosis not present

## 2019-07-11 DIAGNOSIS — I890 Lymphedema, not elsewhere classified: Secondary | ICD-10-CM | POA: Diagnosis not present

## 2019-07-11 DIAGNOSIS — I5022 Chronic systolic (congestive) heart failure: Secondary | ICD-10-CM | POA: Diagnosis not present

## 2019-07-11 DIAGNOSIS — I4819 Other persistent atrial fibrillation: Secondary | ICD-10-CM | POA: Diagnosis not present

## 2019-07-11 DIAGNOSIS — F321 Major depressive disorder, single episode, moderate: Secondary | ICD-10-CM | POA: Diagnosis not present

## 2019-07-11 DIAGNOSIS — J449 Chronic obstructive pulmonary disease, unspecified: Secondary | ICD-10-CM

## 2019-07-11 DIAGNOSIS — E785 Hyperlipidemia, unspecified: Secondary | ICD-10-CM | POA: Diagnosis not present

## 2019-07-11 DIAGNOSIS — G510 Bell's palsy: Secondary | ICD-10-CM | POA: Diagnosis not present

## 2019-07-11 DIAGNOSIS — M10041 Idiopathic gout, right hand: Secondary | ICD-10-CM | POA: Diagnosis not present

## 2019-07-11 DIAGNOSIS — E662 Morbid (severe) obesity with alveolar hypoventilation: Secondary | ICD-10-CM | POA: Diagnosis not present

## 2019-07-11 DIAGNOSIS — Z6841 Body Mass Index (BMI) 40.0 and over, adult: Secondary | ICD-10-CM | POA: Diagnosis not present

## 2019-07-11 DIAGNOSIS — G4733 Obstructive sleep apnea (adult) (pediatric): Secondary | ICD-10-CM | POA: Diagnosis not present

## 2019-07-11 NOTE — Telephone Encounter (Signed)
[name] [name] is calling back to check on the referral

## 2019-07-11 NOTE — Telephone Encounter (Signed)
I contacted French Ana back from [facility] and she wanted to know more information in regards to order for Adult Protective services, she wanted to know the case # and the name and contact information of his Child psychotherapist. I spoke with Marvell Fuller and she stated that you might be able to help answer these questions. I let French Ana from [place] know that I will be forwarding this message to you, she asks that if you could return her call back today sometime after 11AM since she will be in a meeting. She states that she can be reached at [phone]. KW

## 2019-07-11 NOTE — Chronic Care Management (AMB) (Signed)
Chronic Care Management    Clinical Social Work Follow Up Note  07/11/2019 Name: [name] [name] MRN: [id number] DOB: 1960-09-20  [name] [name] is a 59 y.o. year old male who is a primary care patient of Flinchum, [name], FNP. The CCM team was consulted for assistance with [name] .   Review of patient status, including review of consultants reports, other relevant assessments, and collaboration with appropriate care team members and the patient's provider was performed as part of comprehensive patient evaluation and provision of chronic care management services.    SDOH (Social Determinants of Health) assessments performed: No    Outpatient Encounter Medications as of 07/11/2019  Medication Sig  . albuterol (VENTOLIN HFA) 108 (90 Base) MCG/ACT inhaler Inhale 2 puffs into the lungs every 6 (six) hours as needed for wheezing or shortness of breath.  . allopurinol (ZYLOPRIM) 100 MG tablet Take 1 tablet (100 mg total) by mouth daily.  [name] ALPRAZolam (XANAX) 0.5 MG tablet Take 1 tablet (0.5 mg total) by mouth 2 (two) times daily as needed for anxiety or sleep.  . colchicine 0.6 MG tablet 1 tab PO q 12 hrs as needed until gout flare subsides, schedule on office follow up (Patient taking differently: Take 0.6 mg by mouth daily as needed. )  . DULoxetine (CYMBALTA) 20 MG capsule Take 1 capsule (20 mg total) by mouth daily.  . ferrous sulfate 325 (65 FE) MG tablet Take 1 tablet (325 mg total) by mouth 2 (two) times daily with a meal.  . gabapentin (NEURONTIN) 300 MG capsule Take 1 capsule (300 mg total) by mouth 3 (three) times daily.  [name] HYDROcodone-acetaminophen (NORCO/VICODIN) 5-325 MG tablet Take 1-2 tablets by mouth every 6 (six) hours as needed for moderate pain or severe pain.  [name] levothyroxine (SYNTHROID) 50 MCG tablet Take 1 tablet (50 mcg total) by mouth daily before breakfast. Need lab work beginning of June for Thyroid  . lisinopril (ZESTRIL) 2.5 MG tablet Take 1 tablet (2.5  mg total) by mouth daily.  [name] loperamide (IMODIUM) 2 MG capsule Take 1 capsule (2 mg total) by mouth as needed for diarrhea or loose stools.  . metoprolol succinate (TOPROL-XL) 50 MG 24 hr tablet Take 1 tablet (50 mg total) by mouth daily. Take with or immediately following a meal.  . nitroGLYCERIN (NITROSTAT) 0.4 MG SL tablet Place 1 tablet under tongue every 5 minutes as needed for chest pain. (No more than 3 doses within 15 minutes)  . torsemide (DEMADEX) 20 MG tablet Take 4 tablets (80 mg total) by mouth 2 (two) times daily.  . traZODone (DESYREL) 150 MG tablet TAKE 1 TABLET BY MOUTH AT BEDTIME AS NEEDED SLEEP  . warfarin (COUMADIN) 5 MG tablet Take 1 tablet (5 mg total) by mouth daily.   No facility-administered encounter medications on file as of 07/11/2019.     Goals Addressed            This Visit's Progress   . "I was denied more aid service, I need a aid (pt-stated)       CARE PLAN ENTRY (see longtitudinal plan of care for additional care plan information)  Current Barriers:  . ADL IADL limitations  Clinical Social Work Clinical Goal(s):  [name] Over the next 90 days, patient will work with [facility] to address needs related to be denied continued in home aid services  Interventions: . Provider's office requested that this social worker contact French Ana at [facility]  . Phone call to  Tracy-request processor at [facility] to verify the Adult Protective Services(APS) Worker for patient. Per Olivia Mackie, the request form for personal care services indicated that Adult Protective Services  . Per Olivia Mackie, the involvement will have to be confirmed before patient's request moves to the scheduling phase . Name and number of the [place] worker provided Alvester Chou [phone]   Patient Self Care Activities:  . Patient verbalizes understanding of plan to contact [name] to file an appeal for in home services . Unable to perform ADLs independently  Please see  past updates related to this goal by clicking on the "Past Updates" button in the selected goal          Follow Up Plan: Client will continue to follow up with the [facility] for ongoing medical follow up    [name] [name], [place] Social Worker  [facility] [phone]

## 2019-07-12 ENCOUNTER — Telehealth: Payer: Self-pay

## 2019-07-12 ENCOUNTER — Ambulatory Visit: Payer: Medicaid Other | Admitting: Internal Medicine

## 2019-07-12 NOTE — Telephone Encounter (Signed)
Copied from CRM [id number]. Topic: General - Call Back - No Documentation >> Jul 12, 2019  3:10 PM Randol Kern wrote: Reason for CRM: Pt is requesting a call back from "the stupid dumb blonde assistant" (patient's words) or Tyler Pita contact: [phone]

## 2019-07-12 NOTE — Telephone Encounter (Signed)
Please review., KW 

## 2019-07-12 NOTE — Telephone Encounter (Signed)
See note

## 2019-07-13 ENCOUNTER — Encounter: Payer: Self-pay | Admitting: Adult Health

## 2019-07-13 ENCOUNTER — Telehealth: Payer: Self-pay

## 2019-07-13 ENCOUNTER — Ambulatory Visit: Payer: Medicaid Other | Admitting: Physician Assistant

## 2019-07-13 DIAGNOSIS — I872 Venous insufficiency (chronic) (peripheral): Secondary | ICD-10-CM | POA: Diagnosis not present

## 2019-07-13 DIAGNOSIS — L8990 Pressure ulcer of unspecified site, unspecified stage: Secondary | ICD-10-CM | POA: Diagnosis not present

## 2019-07-13 DIAGNOSIS — R260 Ataxic gait: Secondary | ICD-10-CM | POA: Diagnosis not present

## 2019-07-13 DIAGNOSIS — I890 Lymphedema, not elsewhere classified: Secondary | ICD-10-CM | POA: Diagnosis not present

## 2019-07-13 NOTE — Telephone Encounter (Signed)
Returned call to [place], no answer. LVMTCB.

## 2019-07-13 NOTE — Telephone Encounter (Signed)
Attempted to reach patient by phone no answer and voice mail full x 2.  Also discussed with  Vella Kohler, MD, MPH and given patients comment letter will be sent that if he continues to make such comments as below he will be dismissed.

## 2019-07-13 NOTE — Telephone Encounter (Signed)
Copied from CRM [id number]. Topic: General - Other >> Jul 13, 2019 10:37 AM Jaquita Rector A wrote: Reason for CRM: Loel Ro with [facility] called to speak to a nurse in reference to patients care that is received from this facility. Carly can be reached at Ph# [phone]

## 2019-07-16 DIAGNOSIS — Z86718 Personal history of other venous thrombosis and embolism: Secondary | ICD-10-CM | POA: Diagnosis not present

## 2019-07-16 DIAGNOSIS — M10041 Idiopathic gout, right hand: Secondary | ICD-10-CM | POA: Diagnosis not present

## 2019-07-16 DIAGNOSIS — I5022 Chronic systolic (congestive) heart failure: Secondary | ICD-10-CM | POA: Diagnosis not present

## 2019-07-16 DIAGNOSIS — G510 Bell's palsy: Secondary | ICD-10-CM | POA: Diagnosis not present

## 2019-07-16 DIAGNOSIS — E1151 Type 2 diabetes mellitus with diabetic peripheral angiopathy without gangrene: Secondary | ICD-10-CM | POA: Diagnosis not present

## 2019-07-16 DIAGNOSIS — Z6841 Body Mass Index (BMI) 40.0 and over, adult: Secondary | ICD-10-CM | POA: Diagnosis not present

## 2019-07-16 DIAGNOSIS — G8194 Hemiplegia, unspecified affecting left nondominant side: Secondary | ICD-10-CM | POA: Diagnosis not present

## 2019-07-16 DIAGNOSIS — Z792 Long term (current) use of antibiotics: Secondary | ICD-10-CM | POA: Diagnosis not present

## 2019-07-16 DIAGNOSIS — Z7901 Long term (current) use of anticoagulants: Secondary | ICD-10-CM | POA: Diagnosis not present

## 2019-07-16 DIAGNOSIS — E785 Hyperlipidemia, unspecified: Secondary | ICD-10-CM | POA: Diagnosis not present

## 2019-07-16 DIAGNOSIS — M1611 Unilateral primary osteoarthritis, right hip: Secondary | ICD-10-CM | POA: Diagnosis not present

## 2019-07-16 DIAGNOSIS — I110 Hypertensive heart disease with heart failure: Secondary | ICD-10-CM | POA: Diagnosis not present

## 2019-07-16 DIAGNOSIS — J449 Chronic obstructive pulmonary disease, unspecified: Secondary | ICD-10-CM | POA: Diagnosis not present

## 2019-07-16 DIAGNOSIS — E662 Morbid (severe) obesity with alveolar hypoventilation: Secondary | ICD-10-CM | POA: Diagnosis not present

## 2019-07-16 DIAGNOSIS — I890 Lymphedema, not elsewhere classified: Secondary | ICD-10-CM | POA: Diagnosis not present

## 2019-07-16 DIAGNOSIS — G894 Chronic pain syndrome: Secondary | ICD-10-CM | POA: Diagnosis not present

## 2019-07-16 DIAGNOSIS — L97811 Non-pressure chronic ulcer of other part of right lower leg limited to breakdown of skin: Secondary | ICD-10-CM | POA: Diagnosis not present

## 2019-07-16 DIAGNOSIS — G4733 Obstructive sleep apnea (adult) (pediatric): Secondary | ICD-10-CM | POA: Diagnosis not present

## 2019-07-16 DIAGNOSIS — F321 Major depressive disorder, single episode, moderate: Secondary | ICD-10-CM | POA: Diagnosis not present

## 2019-07-16 DIAGNOSIS — I872 Venous insufficiency (chronic) (peripheral): Secondary | ICD-10-CM | POA: Diagnosis not present

## 2019-07-16 DIAGNOSIS — D649 Anemia, unspecified: Secondary | ICD-10-CM | POA: Diagnosis not present

## 2019-07-16 DIAGNOSIS — L97821 Non-pressure chronic ulcer of other part of left lower leg limited to breakdown of skin: Secondary | ICD-10-CM | POA: Diagnosis not present

## 2019-07-16 DIAGNOSIS — I4819 Other persistent atrial fibrillation: Secondary | ICD-10-CM | POA: Diagnosis not present

## 2019-07-16 DIAGNOSIS — M545 Low back pain: Secondary | ICD-10-CM | POA: Diagnosis not present

## 2019-07-16 DIAGNOSIS — I2510 Atherosclerotic heart disease of native coronary artery without angina pectoris: Secondary | ICD-10-CM | POA: Diagnosis not present

## 2019-07-17 ENCOUNTER — Telehealth: Payer: Self-pay

## 2019-07-17 NOTE — Telephone Encounter (Signed)
PT wants a call back, about this request

## 2019-07-17 NOTE — Telephone Encounter (Signed)
Still have tried to contact patient and no contact has been made.

## 2019-07-17 NOTE — Telephone Encounter (Signed)
See telephone encounter from 07/17/19. KW

## 2019-07-17 NOTE — Telephone Encounter (Signed)
Over three attempts to contact patient has been made. Will close out and wait for patient to return call to office.

## 2019-07-17 NOTE — Telephone Encounter (Signed)
Carly Recker from [employer] request a call back to discuss patients recent visit and condition/status of health. She request a call back at [phone], ( see phone encounter 07/13/19). KW

## 2019-07-18 ENCOUNTER — Encounter: Payer: Self-pay | Admitting: Adult Health

## 2019-07-18 DIAGNOSIS — Z7901 Long term (current) use of anticoagulants: Secondary | ICD-10-CM | POA: Diagnosis not present

## 2019-07-18 DIAGNOSIS — J449 Chronic obstructive pulmonary disease, unspecified: Secondary | ICD-10-CM | POA: Diagnosis not present

## 2019-07-18 DIAGNOSIS — F321 Major depressive disorder, single episode, moderate: Secondary | ICD-10-CM | POA: Diagnosis not present

## 2019-07-18 DIAGNOSIS — E785 Hyperlipidemia, unspecified: Secondary | ICD-10-CM | POA: Diagnosis not present

## 2019-07-18 DIAGNOSIS — I4819 Other persistent atrial fibrillation: Secondary | ICD-10-CM | POA: Diagnosis not present

## 2019-07-18 DIAGNOSIS — Z792 Long term (current) use of antibiotics: Secondary | ICD-10-CM | POA: Diagnosis not present

## 2019-07-18 DIAGNOSIS — E1151 Type 2 diabetes mellitus with diabetic peripheral angiopathy without gangrene: Secondary | ICD-10-CM | POA: Diagnosis not present

## 2019-07-18 DIAGNOSIS — G510 Bell's palsy: Secondary | ICD-10-CM | POA: Diagnosis not present

## 2019-07-18 DIAGNOSIS — E662 Morbid (severe) obesity with alveolar hypoventilation: Secondary | ICD-10-CM | POA: Diagnosis not present

## 2019-07-18 DIAGNOSIS — Z6841 Body Mass Index (BMI) 40.0 and over, adult: Secondary | ICD-10-CM | POA: Diagnosis not present

## 2019-07-18 DIAGNOSIS — I2510 Atherosclerotic heart disease of native coronary artery without angina pectoris: Secondary | ICD-10-CM | POA: Diagnosis not present

## 2019-07-18 DIAGNOSIS — M545 Low back pain: Secondary | ICD-10-CM | POA: Diagnosis not present

## 2019-07-18 DIAGNOSIS — I872 Venous insufficiency (chronic) (peripheral): Secondary | ICD-10-CM | POA: Diagnosis not present

## 2019-07-18 DIAGNOSIS — L97821 Non-pressure chronic ulcer of other part of left lower leg limited to breakdown of skin: Secondary | ICD-10-CM | POA: Diagnosis not present

## 2019-07-18 DIAGNOSIS — D649 Anemia, unspecified: Secondary | ICD-10-CM | POA: Diagnosis not present

## 2019-07-18 DIAGNOSIS — G8194 Hemiplegia, unspecified affecting left nondominant side: Secondary | ICD-10-CM | POA: Diagnosis not present

## 2019-07-18 DIAGNOSIS — M1611 Unilateral primary osteoarthritis, right hip: Secondary | ICD-10-CM | POA: Diagnosis not present

## 2019-07-18 DIAGNOSIS — I890 Lymphedema, not elsewhere classified: Secondary | ICD-10-CM | POA: Diagnosis not present

## 2019-07-18 DIAGNOSIS — Z86718 Personal history of other venous thrombosis and embolism: Secondary | ICD-10-CM | POA: Diagnosis not present

## 2019-07-18 DIAGNOSIS — I5022 Chronic systolic (congestive) heart failure: Secondary | ICD-10-CM | POA: Diagnosis not present

## 2019-07-18 DIAGNOSIS — G894 Chronic pain syndrome: Secondary | ICD-10-CM | POA: Diagnosis not present

## 2019-07-18 DIAGNOSIS — G4733 Obstructive sleep apnea (adult) (pediatric): Secondary | ICD-10-CM | POA: Diagnosis not present

## 2019-07-18 DIAGNOSIS — M10041 Idiopathic gout, right hand: Secondary | ICD-10-CM | POA: Diagnosis not present

## 2019-07-18 DIAGNOSIS — L97811 Non-pressure chronic ulcer of other part of right lower leg limited to breakdown of skin: Secondary | ICD-10-CM | POA: Diagnosis not present

## 2019-07-18 DIAGNOSIS — I110 Hypertensive heart disease with heart failure: Secondary | ICD-10-CM | POA: Diagnosis not present

## 2019-07-18 NOTE — Telephone Encounter (Signed)
I am going to have her fill out a medical records request prior to talking to her or before sending any records. Who do we need to have do this ?

## 2019-07-18 NOTE — Telephone Encounter (Signed)
Ok I would have done, just did not know  what to send her, this way all is HIPPA compliant.

## 2019-07-18 NOTE — Telephone Encounter (Signed)
Left message for social worker to call office back, medical release needs to be filled out per [facility], we can fax form to her if needed. Okay for [facility] triage to advise social worker if she returns call. KW

## 2019-07-18 NOTE — Telephone Encounter (Signed)
I can call case worker and advise her to fill out request for medical records, Im not sure of any other way. KW

## 2019-07-18 NOTE — Telephone Encounter (Signed)
Yes I think either one is sufficient. Thank you so much

## 2019-07-18 NOTE — Telephone Encounter (Signed)
Oh I didn't think about that, we could just fax a medical release form to her? Otherwise we can forward this message to medial records through [facility])

## 2019-07-20 ENCOUNTER — Other Ambulatory Visit: Payer: Self-pay

## 2019-07-20 ENCOUNTER — Encounter: Payer: Medicaid Other | Attending: Physician Assistant | Admitting: Physician Assistant

## 2019-07-20 DIAGNOSIS — I5042 Chronic combined systolic (congestive) and diastolic (congestive) heart failure: Secondary | ICD-10-CM | POA: Diagnosis not present

## 2019-07-20 DIAGNOSIS — Z6841 Body Mass Index (BMI) 40.0 and over, adult: Secondary | ICD-10-CM | POA: Insufficient documentation

## 2019-07-20 DIAGNOSIS — E11622 Type 2 diabetes mellitus with other skin ulcer: Secondary | ICD-10-CM | POA: Diagnosis not present

## 2019-07-20 DIAGNOSIS — L97812 Non-pressure chronic ulcer of other part of right lower leg with fat layer exposed: Secondary | ICD-10-CM | POA: Insufficient documentation

## 2019-07-20 DIAGNOSIS — I252 Old myocardial infarction: Secondary | ICD-10-CM | POA: Insufficient documentation

## 2019-07-20 DIAGNOSIS — I480 Paroxysmal atrial fibrillation: Secondary | ICD-10-CM | POA: Insufficient documentation

## 2019-07-20 DIAGNOSIS — L97822 Non-pressure chronic ulcer of other part of left lower leg with fat layer exposed: Secondary | ICD-10-CM | POA: Insufficient documentation

## 2019-07-20 DIAGNOSIS — Z86711 Personal history of pulmonary embolism: Secondary | ICD-10-CM | POA: Insufficient documentation

## 2019-07-20 DIAGNOSIS — L03116 Cellulitis of left lower limb: Secondary | ICD-10-CM | POA: Diagnosis not present

## 2019-07-20 DIAGNOSIS — I872 Venous insufficiency (chronic) (peripheral): Secondary | ICD-10-CM | POA: Insufficient documentation

## 2019-07-20 DIAGNOSIS — I890 Lymphedema, not elsewhere classified: Secondary | ICD-10-CM | POA: Diagnosis not present

## 2019-07-20 DIAGNOSIS — L03115 Cellulitis of right lower limb: Secondary | ICD-10-CM | POA: Diagnosis not present

## 2019-07-20 DIAGNOSIS — Z7901 Long term (current) use of anticoagulants: Secondary | ICD-10-CM | POA: Insufficient documentation

## 2019-07-20 DIAGNOSIS — E669 Obesity, unspecified: Secondary | ICD-10-CM | POA: Insufficient documentation

## 2019-07-20 DIAGNOSIS — J449 Chronic obstructive pulmonary disease, unspecified: Secondary | ICD-10-CM | POA: Diagnosis not present

## 2019-07-20 DIAGNOSIS — I110 Hypertensive heart disease with heart failure: Secondary | ICD-10-CM | POA: Diagnosis not present

## 2019-07-20 NOTE — Progress Notes (Addendum)
JEFFRIE, LOFSTROM ([id number]) Visit Report for 07/20/2019 Chief Complaint Document Details Patient Name: [name] [name], [name] [name] Date of Service: 07/20/2019 8:00 AM Medical Record Number: [id number] Patient Account Number: [id number] Date of Birth/Sex: [birth year] (59 y.o. M) Treating RN: Montey Hora Primary Care Provider: Laverna Peace Other Clinician: Referring Provider: Laverna Peace Treating Provider/Extender: Melburn Hake, Ayrabella Labombard Weeks in Treatment: 11 Information Obtained from: Patient Chief Complaint Bilateral LE ulcers Electronic Signature(s) Signed: 07/20/2019 8:27:32 AM By: Worthy Keeler PA-C Entered By: Worthy Keeler on 07/20/2019 08:27:32 JASIAH, ELSEN ([id number]) -------------------------------------------------------------------------------- HPI Details Patient Name: [name] [name] Date of Service: 07/20/2019 8:00 AM Medical Record Number: [id number] Patient Account Number: [id number] Date of Birth/Sex: 07/23/1960 (59 y.o. M) Treating RN: Montey Hora Primary Care Provider: Laverna Peace Other Clinician: Referring Provider: Laverna Peace Treating Provider/Extender: Melburn Hake, Bethan Adamek Weeks in Treatment: 11 History of Present Illness HPI Description: 01/18/2019 on evaluation today patient presents for initial evaluation here in our clinic concerning issues he is been having with his bilateral lower extremities. He appears based on what I am seeing to have bilateral lower extremity cellulitis along with chronic venous stasis. He has significant pitting edema even in the bottom of his foot. With that being said he also has a history of hypertension, COPD, congestive heart failure, long-term use of anticoagulants due to atrial fibrillation which is paroxysmal as well as having had a pulmonary embolism in the past. With that being said he tells me that he has constant drainage out of his left lower extremity. This is likely due to the fact that again he has  significant swelling pretty much all the time. With that being said in the past 2 weeks this has been getting worse and he tells me at this point that he feels like it is infected is also hurting a lot more than it has been in the past and the drainage is a different color. The left side is definitely worse than the right. Fortunately there is no signs of active infection systemically although I am concerned due to the severity of the infection that I see in the left lower extremity especially. There is a lot of necrotic tissue on the surface of both wounds that at some point is going to need to be cleaned off to allow these to heal but right now obviously this is much too tender for him. No fevers, chills, nausea, vomiting, or diarrhea. 02/02/2019 on evaluation today patient presents for follow-up concerning the wounds over his bilateral lower extremities. He is continuing to manage. He was in the hospital and has been discharged at this point. He was admitted from 01/21/2019 through 01/27/2019. This was due to cellulitis of lower extremity. Nonetheless he does seem to be doing much better at this point. I did review his discharge summary as well he did not appear to have any positive findings on blood cultures. His white blood cell count was never really elevated either which is also good news. He was treated with IV clindamycin for 4 days and then switch to complete a 10-day course at home which she is still working on at this point. They did have him on IV Lasix in the hospital which helped him diurese well on discharge they stated that his weight was 408 pounds which is still higher than his dry weight of 375 to 380 pounds. Upon discharge his torsemide was increased from 40 to 60 mg twice daily and he is to follow-up with cardiology as well. 12/29-Patient returns at 2 weeks  for evaluation and follow-up of his bilateral lower extremity wounds, with lymphedema, venous ulcerations, patient was  recently hospitalized and was aggressively diuresed after which his torsemide has been increased. He follows up with cardiology as well. His left leg stasis wounds are more moist and have maceration of tissue, the right one is less so. We are using silver cell and unna compression given the degree of edema in the legs. Readmission: 05/04/2019 patient presents today for reevaluation here in the clinic I have not seen him since December 2020. Subsequently this is a fairly sick individual with multiple comorbidities who presents for follow-up concerning wounds of his bilateral lower extremities. He tells me that after he was in the hospital following when we saw him last in December that he ended up subsequently being discharged with home health services and they have been coming out and applying Unna boot wraps. With that being said he has used up all of his home health visits for the year and then for the last week has not had anything on his legs as far as the wrap is concerned. Obviously this is not good and he does come in today with increased swelling or at least what I perceived to be along with somewhat dry wounds again he does not seem to be weeping too much at this point which is at least good news but he has significant lymphedema/venous stasis. No fevers, chills, nausea, vomiting, or diarrhea. 06/01/2019 upon evaluation today patient is seen after not having been seen actually since 19 2021. He kept coming for appointment with every time he would come his temperature would be elevated past the point where we could see him. I told him that he needed to go be seen at the ER during 1 of these times due to the fact that obviously this was a fever of unknown origin at that point and my concern was that it could potentially be his legs but again I was not entirely sure as obviously I could not see them due to the restrictions secondary to Covid even bringing the patient into the clinic. Nonetheless  he finally came in today his temperature again was elevated but not to the same degree that it met the 100.4 or above guidelines of having to cancel the appointment. Either way we did have a look at his legs unfortunately he appears to show signs of cellulitis at this point which is probably where the temperature elevations are emanating from. He has significant lymphedema as well as chronic vent venous stasis as well. When I initially told him that is why I told him to go to the ER previously when his temperature was elevated in case it was something with wounds or otherwise he replied "nobody ever told me to go to the ER". I then subsequently reminded him of me coming to the front desk due to his temperature being elevated and advising him to go to the ER to which she replied "I am not going to the ER and I do not think I need to even with this". Nonetheless I am concerned about the infection of his legs at this point he has significant weeping, significant edema, and overall is not doing very well in my opinion. 4/23; the patient arrives in clinic today having napkin stuck on his wounds. He is taking the antibiotic from last week. Last Wednesday he had his INR checked at the Coumadin clinic it was apparently "sky high" and his Coumadin is now on hold.  He is going back to check this again on Monday. His legs were not put back in compression last week out of fear of the extending infection with fever. He has been taking the antibiotics. Culture from last week showed Enterobacter and methicillin resistant staph aureus. He is on on doxycycline which the MRSA was sensitive to Enterobacter was not plated but given the pattern of sensitivities probably the doxycycline was satisfactory. I will extend the doxycycline another 4 days until he is seen next week. From an infection point of view this looks better 06/14/2019 upon evaluation today patient unfortunately continues to have signs of cellulitis on the  lower extremities bilaterally especially on the left. Unfortunately he doesn't seem to be doing much better despite being on doxycycline for a couple of weeks. He was positive for MRSA as well as Enterobacter. Obviously the MRSA is treated by way of the doxycycline the Enterobacter unfortunately is not I'm going to see about initiating Bactrim for him at this point. 5/13; since he was last here he was hospitalized from 5/7 through 5/11. Mostly this was for congestive heart failure. He has an ejection fraction of 25 to 30%. His torsemide dose was increased. While he was there they dealt with the infection in the right leg. He did not take his Bactrim that he was prescribed last time. He was treated with linezolid in the hospital and cefepime and discharged on Bactrim to complete a 2-week course. He was supposed to follow-up with the Coumadin clinic at his cardiologist's office [Dr. End). He has not done this. He tells me he is on Coumadino 5 mg twice a day. The last INR I see is from 5/11 when he was discharged from the hospital it was 2.1 however it was increasing every day they check this. He was thought to have cellulitis from methicillin-resistant staph aureus. RYEN, RHAMES ([id number]) 5/21; patient's wound on the right lower leg actually looks a lot better although not much change in size. We used Iodoflex under an Haematologist last week. The [employer] stayed in place. 07/20/2019 upon evaluation today patient actually appears to be doing much better in regard to his legs in general. He does not have the infection that was previously noted which is great news he was in the hospital which he finally did go after things got significantly worse. Fortunately he seems to be doing much better in regard to his overall appearance today in that regard. With that being said he still is experiencing issues currently with wounds over the lower extremities and his wraps do tend to slip down. We probably do need  to use the Unna paste which I think we are doing here but I am not sure the home health nurses are using right now we may need to send this order along as well. Electronic Signature(s) Signed: 07/20/2019 5:50:44 PM By: Worthy Keeler PA-C Entered By: Worthy Keeler on 07/20/2019 17:50:43 JAMARKUS, LISBON ([id number]) -------------------------------------------------------------------------------- Physical Exam Details Patient Name: [name] [name] Date of Service: 07/20/2019 8:00 AM Medical Record Number: [id number] Patient Account Number: [id number] Date of Birth/Sex: [birth year] (59 y.o. M) Treating RN: Montey Hora Primary Care Provider: Laverna Peace Other Clinician: Referring Provider: Laverna Peace Treating Provider/Extender: STONE III, Guilherme Schwenke Weeks in Treatment: 11 Constitutional Obese and well-hydrated in no acute distress. Respiratory normal breathing without difficulty. Psychiatric this patient is able to make decisions and demonstrates good insight into disease process. Alert and Oriented x 3. pleasant and cooperative. Notes Upon inspection patient's  wounds do have some slough noted on the surface of the wound fortunately this is not too significant which is great news and overall I feel like he is making good progress. Electronic Signature(s) Signed: 07/20/2019 5:51:16 PM By: Worthy Keeler PA-C Entered By: Worthy Keeler on 07/20/2019 17:51:15 [name] [name] ([id number]) -------------------------------------------------------------------------------- Physician Orders Details Patient Name: [name] [name] Date of Service: 07/20/2019 8:00 AM Medical Record Number: [id number] Patient Account Number: [id number] Date of Birth/Sex: Oct 25, 1960 (59 y.o. M) Treating RN: Montey Hora Primary Care Provider: Laverna Peace Other Clinician: Referring Provider: Laverna Peace Treating Provider/Extender: Melburn Hake, [place] Haub Weeks in Treatment: 11 Verbal / Phone Orders:  No Diagnosis Coding ICD-10 Coding Code Description I87.2 Venous insufficiency (chronic) (peripheral) I89.0 Lymphedema, not elsewhere classified L97.812 Non-pressure chronic ulcer of other part of right lower leg with fat layer exposed L97.822 Non-pressure chronic ulcer of other part of left lower leg with fat layer exposed [place] (primary) hypertension J44.9 Chronic obstructive pulmonary disease, unspecified Z79.01 Long term (current) use of anticoagulants I48.0 Paroxysmal atrial fibrillation Z86.711 Personal history of pulmonary embolism I50.42 Chronic combined systolic (congestive) and diastolic (congestive) heart failure Wound Cleansing Wound #5 Left,Lateral Lower Leg o Cleanse wound with mild soap and water o May shower with protection. Wound #7 Right,Anterior Lower Leg o Cleanse wound with mild soap and water o May shower with protection. Wound #8 Right,Proximal,Medial,Anterior Lower Leg o Cleanse wound with mild soap and water o May shower with protection. Primary Wound Dressing Wound #5 Left,Lateral Lower Leg o Hydrafera Blue Ready Transfer Wound #7 Right,Anterior Lower Leg o Hydrafera Blue Ready Transfer Wound #8 Right,Proximal,Medial,Anterior Lower Leg o Hydrafera Blue Ready Transfer Secondary Dressing Wound #5 Left,Lateral Lower Leg o ABD pad o XtraSorb - where needed Wound #7 Right,Anterior Lower Leg o ABD pad o XtraSorb - where needed Wound #8 Right,Proximal,Medial,Anterior Lower Leg o ABD pad o XtraSorb - where needed Dressing Change Frequency Wound #5 Left,Lateral Lower Leg o Other: - twice weekly Vasil, Chrissie Noa ([id number]) Wound #8 Right,Proximal,Medial,Anterior Lower Leg o Other: - twice weekly Wound #7 Right,Anterior Lower Leg o Other: - twice weekly Follow-up Appointments Wound #5 Left,Lateral Lower Leg o Return Appointment in 1 week. o Nurse Visit as needed Wound #8 Right,Proximal,Medial,Anterior  Lower Leg o Return Appointment in 1 week. o Nurse Visit as needed Wound #7 Right,Anterior Lower Leg o Return Appointment in 1 week. o Nurse Visit as needed Edema Control Wound #5 Left,Lateral Lower Leg o 4 Layer Compression System - Bilateral - please anchor with unna o Elevate legs to the level of the heart and pump ankles as often as possible Wound #7 Right,Anterior Lower Leg o 4 Layer Compression System - Bilateral - please anchor with unna o Elevate legs to the level of the heart and pump ankles as often as possible Wound #8 Right,Proximal,Medial,Anterior Lower Leg o 4 Layer Compression System - Bilateral - please anchor with unna o Elevate legs to the level of the heart and pump ankles as often as possible Home Health Wound#5 [place] Nurse may visit PRN to address patientos wound care needs. o FACE TO FACE ENCOUNTER: MEDICARE and MEDICAID PATIENTS: I certify that this patient is under my care and that I had a face-to-face encounter that meets the physician face-to-face encounter requirements with this patient on this date. The encounter with the patient was in whole or in part for the following MEDICAL CONDITION: (primary reason for [place]) MEDICAL NECESSITY: I  certify, that based on my findings, NURSING services are a medically necessary home health service. HOME BOUND STATUS: I certify that my clinical findings support that this patient is homebound (i.e., Due to illness or injury, pt requires aid of supportive devices such as crutches, cane, wheelchairs, walkers, the use of special transportation or the assistance of another person to leave their place of residence. There is a normal inability to leave the home and doing so requires considerable and taxing effort. Other absences are for medical reasons / religious services and are infrequent or of short duration when for other  reasons). o If current dressing causes regression in wound condition, may D/C ordered dressing product/s and apply Normal Saline Moist Dressing daily until next [place] / Other MD appointment. [place] of regression in wound condition at [phone]. o Please direct any NON-WOUND related issues/requests for orders to patient's Primary Care Physician Wound#7 [place] Visits o Home Health Nurse may visit PRN to address patientos wound care needs. o FACE TO FACE ENCOUNTER: MEDICARE and MEDICAID PATIENTS: I certify that this patient is under my care and that I had a face-to-face encounter that meets the physician face-to-face encounter requirements with this patient on this date. The encounter with the patient was in whole or in part for the following MEDICAL CONDITION: (primary reason for [place]) MEDICAL NECESSITY: I certify, that based on my findings, NURSING services are a medically necessary home health service. HOME BOUND STATUS: I certify that my clinical findings support that this patient is homebound (i.e., Due to illness or injury, pt requires aid of supportive devices such as crutches, cane, wheelchairs, walkers, the use of special transportation or the assistance of another person to leave their place of residence. There is a normal inability to leave the home and doing so requires considerable and taxing effort. Other absences are for medical reasons / religious services and are infrequent or of short duration when for other reasons). o If current dressing causes regression in wound condition, may D/C ordered dressing product/s and apply Normal Saline Moist Dressing daily until next [place] / Other MD appointment. [place] of regression in wound condition at [phone]. o Please direct any NON-WOUND related issues/requests for orders to patient's Primary Care  Physician Wound #8 Right,Proximal,Medial,Anterior Lower Leg o [place] Nurse may visit PRN to address patientos wound care needs. o FACE TO FACE ENCOUNTER: MEDICARE and MEDICAID PATIENTS: I certify that this patient is under my care and that I had a face-to-face encounter that meets the physician face-to-face encounter requirements with this patient on this date. The encounter with the patient was in whole or in part for the following MEDICAL CONDITION: (primary reason for [place]) MEDICAL NECESSITY: I certify, that based on my findings, NURSING services are a medically necessary home MERCEDES, VALERIANO ([id number]) health service. HOME BOUND STATUS: I certify that my clinical findings support that this patient is homebound (i.e., Due to illness or injury, pt requires aid of supportive devices such as crutches, cane, wheelchairs, walkers, the use of special transportation or the assistance of another person to leave their place of residence. There is a normal inability to leave the home and doing so requires considerable and taxing effort. Other absences are for medical reasons / religious services and are infrequent or of short duration when for other reasons). o If current dressing causes regression in wound condition, may  D/C ordered dressing product/s and apply Normal Saline Moist Dressing daily until next [place] / Other MD appointment. [place] of regression in wound condition at [phone]. o Please direct any NON-WOUND related issues/requests for orders to patient's Primary Care Physician Electronic Signature(s) Signed: 07/20/2019 4:32:38 PM By: Montey Hora Signed: 07/20/2019 6:04:00 PM By: Worthy Keeler PA-C Entered By: Montey Hora on 07/20/2019 08:40:25 MERWIN, BREDEN ([id number]) -------------------------------------------------------------------------------- Problem List Details Patient Name:  [name] [name] Date of Service: 07/20/2019 8:00 AM Medical Record Number: [id number] Patient Account Number: [id number] Date of Birth/Sex: 1961-01-13 (59 y.o. M) Treating RN: Montey Hora Primary Care Provider: Laverna Peace Other Clinician: Referring Provider: Laverna Peace Treating Provider/Extender: Melburn Hake, Fabiana Dromgoole Weeks in Treatment: 11 Active Problems ICD-10 Encounter Code Description Active Date MDM Diagnosis I87.2 Venous insufficiency (chronic) (peripheral) 05/04/2019 No Yes I89.0 Lymphedema, not elsewhere classified 05/04/2019 No Yes L97.812 Non-pressure chronic ulcer of other part of right lower leg with fat layer 05/04/2019 No Yes exposed L97.822 Non-pressure chronic ulcer of other part of left lower leg with fat layer 05/04/2019 No Yes exposed [place] (primary) hypertension 05/04/2019 No Yes J44.9 Chronic obstructive pulmonary disease, unspecified 05/04/2019 No Yes Z79.01 Long term (current) use of anticoagulants 05/04/2019 No Yes I48.0 Paroxysmal atrial fibrillation 05/04/2019 No Yes Z86.711 Personal history of pulmonary embolism 05/04/2019 No Yes I50.42 Chronic combined systolic (congestive) and diastolic (congestive) heart 05/04/2019 No Yes failure Inactive Problems Resolved Problems Electronic Signature(s) Signed: 07/20/2019 8:27:28 AM By: Vertis Kelch, Chrissie Noa ([id number]) Entered By: Worthy Keeler on 07/20/2019 08:27:28 [name] [name] ([id number]) -------------------------------------------------------------------------------- Progress Note Details Patient Name: [name] [name] Date of Service: 07/20/2019 8:00 AM Medical Record Number: [id number] Patient Account Number: [id number] Date of Birth/Sex: [birth year] (58 y.o. M) Treating RN: Montey Hora Primary Care Provider: Laverna Peace Other Clinician: Referring Provider: Laverna Peace Treating Provider/Extender: Melburn Hake, Reynaldo Rossman Weeks in Treatment: 11 Subjective Chief  Complaint Information obtained from Patient Bilateral LE ulcers History of Present Illness (HPI) 01/18/2019 on evaluation today patient presents for initial evaluation here in our clinic concerning issues he is been having with his bilateral lower extremities. He appears based on what I am seeing to have bilateral lower extremity cellulitis along with chronic venous stasis. He has significant pitting edema even in the bottom of his foot. With that being said he also has a history of hypertension, COPD, congestive heart failure, long-term use of anticoagulants due to atrial fibrillation which is paroxysmal as well as having had a pulmonary embolism in the past. With that being said he tells me that he has constant drainage out of his left lower extremity. This is likely due to the fact that again he has significant swelling pretty much all the time. With that being said in the past 2 weeks this has been getting worse and he tells me at this point that he feels like it is infected is also hurting a lot more than it has been in the past and the drainage is a different color. The left side is definitely worse than the right. Fortunately there is no signs of active infection systemically although I am concerned due to the severity of the infection that I see in the left lower extremity especially. There is a lot of necrotic tissue on the surface of both wounds that at some point is going to need to be cleaned off to allow these to heal but right now obviously this is much too tender for him. No fevers, chills,  nausea, vomiting, or diarrhea. 02/02/2019 on evaluation today patient presents for follow-up concerning the wounds over his bilateral lower extremities. He is continuing to manage. He was in the hospital and has been discharged at this point. He was admitted from 01/21/2019 through 01/27/2019. This was due to cellulitis of lower extremity. Nonetheless he does seem to be doing much better at this  point. I did review his discharge summary as well he did not appear to have any positive findings on blood cultures. His white blood cell count was never really elevated either which is also good news. He was treated with IV clindamycin for 4 days and then switch to complete a 10-day course at home which she is still working on at this point. They did have him on IV Lasix in the hospital which helped him diurese well on discharge they stated that his weight was 408 pounds which is still higher than his dry weight of 375 to 380 pounds. Upon discharge his torsemide was increased from 40 to 60 mg twice daily and he is to follow-up with cardiology as well. 12/29-Patient returns at 2 weeks for evaluation and follow-up of his bilateral lower extremity wounds, with lymphedema, venous ulcerations, patient was recently hospitalized and was aggressively diuresed after which his torsemide has been increased. He follows up with cardiology as well. His left leg stasis wounds are more moist and have maceration of tissue, the right one is less so. We are using silver cell and unna compression given the degree of edema in the legs. Readmission: 05/04/2019 patient presents today for reevaluation here in the clinic I have not seen him since December 2020. Subsequently this is a fairly sick individual with multiple comorbidities who presents for follow-up concerning wounds of his bilateral lower extremities. He tells me that after he was in the hospital following when we saw him last in December that he ended up subsequently being discharged with home health services and they have been coming out and applying Unna boot wraps. With that being said he has used up all of his home health visits for the year and then for the last week has not had anything on his legs as far as the wrap is concerned. Obviously this is not good and he does come in today with increased swelling or at least what I perceived to be along with  somewhat dry wounds again he does not seem to be weeping too much at this point which is at least good news but he has significant lymphedema/venous stasis. No fevers, chills, nausea, vomiting, or diarrhea. 06/01/2019 upon evaluation today patient is seen after not having been seen actually since 19 2021. He kept coming for appointment with every time he would come his temperature would be elevated past the point where we could see him. I told him that he needed to go be seen at the ER during 1 of these times due to the fact that obviously this was a fever of unknown origin at that point and my concern was that it could potentially be his legs but again I was not entirely sure as obviously I could not see them due to the restrictions secondary to Covid even bringing the patient into the clinic. Nonetheless he finally came in today his temperature again was elevated but not to the same degree that it met the 100.4 or above guidelines of having to cancel the appointment. Either way we did have a look at his legs unfortunately he appears to show  signs of cellulitis at this point which is probably where the temperature elevations are emanating from. He has significant lymphedema as well as chronic vent venous stasis as well. When I initially told him that is why I told him to go to the ER previously when his temperature was elevated in case it was something with wounds or otherwise he replied "nobody ever told me to go to the ER". I then subsequently reminded him of me coming to the front desk due to his temperature being elevated and advising him to go to the ER to which she replied "I am not going to the ER and I do not think I need to even with this". Nonetheless I am concerned about the infection of his legs at this point he has significant weeping, significant edema, and overall is not doing very well in my opinion. 4/23; the patient arrives in clinic today having napkin stuck on his wounds. He is  taking the antibiotic from last week. Last Wednesday he had his INR checked at the Coumadin clinic it was apparently "sky high" and his Coumadin is now on hold. He is going back to check this again on Monday. His legs were not put back in compression last week out of fear of the extending infection with fever. He has been taking the antibiotics. Culture from last week showed Enterobacter and methicillin resistant staph aureus. He is on on doxycycline which the MRSA was sensitive to Enterobacter was not plated but given the pattern of sensitivities probably the doxycycline was satisfactory. I will extend the doxycycline another 4 days until he is seen next week. From an infection point of view this looks better 06/14/2019 upon evaluation today patient unfortunately continues to have signs of cellulitis on the lower extremities bilaterally especially on the left. Unfortunately he doesn't seem to be doing much better despite being on doxycycline for a couple of weeks. He was positive for MRSA as well as Enterobacter. Obviously the MRSA is treated by way of the doxycycline the Enterobacter unfortunately is not I'm going to see about initiating Bactrim for him at this point. 5/13; since he was last here he was hospitalized from 5/7 through 5/11. Mostly this was for congestive heart failure. He has an ejection fraction of Diskin, Rocky ([id number]) 25 to 30%. His torsemide dose was increased. While he was there they dealt with the infection in the right leg. He did not take his Bactrim that he was prescribed last time. He was treated with linezolid in the hospital and cefepime and discharged on Bactrim to complete a 2-week course. He was supposed to follow-up with the Coumadin clinic at his cardiologist's office [Dr. End). He has not done this. He tells me he is on Coumadino 5 mg twice a day. The last INR I see is from 5/11 when he was discharged from the hospital it was 2.1 however it was increasing every  day they check this. He was thought to have cellulitis from methicillin-resistant staph aureus. 5/21; patient's wound on the right lower leg actually looks a lot better although not much change in size. We used Iodoflex under an Haematologist last week. The [employer] stayed in place. 07/20/2019 upon evaluation today patient actually appears to be doing much better in regard to his legs in general. He does not have the infection that was previously noted which is great news he was in the hospital which he finally did go after things got significantly worse. Fortunately he seems to  be doing much better in regard to his overall appearance today in that regard. With that being said he still is experiencing issues currently with wounds over the lower extremities and his wraps do tend to slip down. We probably do need to use the Unna paste which I think we are doing here but I am not sure the home health nurses are using right now we may need to send this order along as well. Objective Constitutional Obese and well-hydrated in no acute distress. Vitals Time Taken: 8:10 AM, Height: 68 in, Weight: 400 lbs, BMI: 60.8, Temperature: 97.9 F, Pulse: 69 bpm, Respiratory Rate: 18 breaths/min, Blood Pressure: 125/62 mmHg. Respiratory normal breathing without difficulty. Psychiatric this patient is able to make decisions and demonstrates good insight into disease process. Alert and Oriented x 3. pleasant and cooperative. General Notes: Upon inspection patient's wounds do have some slough noted on the surface of the wound fortunately this is not too significant which is great news and overall I feel like he is making good progress. Integumentary (Hair, Skin) Wound #5 status is Open. Original cause of wound was Gradually Appeared. The wound is located on the Left,Lateral Lower Leg. The wound measures 9cm length x 10.6cm width x 0.3cm depth; 74.927cm^2 area and 22.478cm^3 volume. There is Fat Layer (Subcutaneous  Tissue) Exposed exposed. There is no tunneling or undermining noted. There is a large amount of serosanguineous drainage noted. The wound margin is flat and intact. There is small (1-33%) pink granulation within the wound bed. There is a large (67-100%) amount of necrotic tissue within the wound bed including Adherent Slough. Wound #7 status is Open. Original cause of wound was Gradually Appeared. The wound is located on the Right,Anterior Lower Leg. The wound measures 5.5cm length x 6.5cm width x 0.2cm depth; 28.078cm^2 area and 5.616cm^3 volume. There is Fat Layer (Subcutaneous Tissue) Exposed exposed. There is no tunneling or undermining noted. There is a medium amount of serosanguineous drainage noted. The wound margin is flat and intact. There is small (1-33%) pink granulation within the wound bed. There is a large (67-100%) amount of necrotic tissue within the wound bed including Adherent Slough. Wound #8 status is Open. Original cause of wound was Gradually Appeared. The wound is located on the Right,Proximal,Medial,Anterior Lower Leg. The wound measures 1.2cm length x 1.5cm width x 0.1cm depth; 1.414cm^2 area and 0.141cm^3 volume. There is no tunneling or undermining noted. There is a large amount of serous drainage noted. The wound margin is flat and intact. There is medium (34-66%) granulation within the wound bed. There is a medium (34-66%) amount of necrotic tissue within the wound bed including Adherent Slough. Assessment Active Problems ICD-10 Venous insufficiency (chronic) (peripheral) Lymphedema, not elsewhere classified Non-pressure chronic ulcer of other part of right lower leg with fat layer exposed Non-pressure chronic ulcer of other part of left lower leg with fat layer exposed Essential (primary) hypertension Chronic obstructive pulmonary disease, unspecified Long term (current) use of anticoagulants Paroxysmal atrial fibrillation Personal history of pulmonary  embolism Chronic combined systolic (congestive) and diastolic (congestive) heart failure Mcneel, Sharif ([id number]) Procedures Wound #5 Pre-procedure diagnosis of Wound #5 is a Lymphedema located on the Left,Lateral Lower Leg . There was a Four Layer Compression Therapy Procedure by Montey Hora, RN. Post procedure Diagnosis Wound #5: Same as Pre-Procedure Wound #7 Pre-procedure diagnosis of Wound #7 is a Venous Leg Ulcer located on the Right,Anterior Lower Leg . There was a Four Layer Compression Therapy Procedure by Montey Hora, RN. Post  procedure Diagnosis Wound #7: Same as Pre-Procedure Wound #8 Pre-procedure diagnosis of Wound #8 is a Diabetic Wound/Ulcer of the Lower Extremity located on the Right,Proximal,Medial,Anterior Lower Leg . There was a Four Layer Compression Therapy Procedure by Montey Hora, RN. Post procedure Diagnosis Wound #8: Same as Pre-Procedure Plan Wound Cleansing: Wound #5 Left,Lateral Lower Leg: Cleanse wound with mild soap and water May shower with protection. Wound #7 Right,Anterior Lower Leg: Cleanse wound with mild soap and water May shower with protection. Wound #8 Right,Proximal,Medial,Anterior Lower Leg: Cleanse wound with mild soap and water May shower with protection. Primary Wound Dressing: Wound #5 Left,Lateral Lower Leg: Hydrafera Blue Ready Transfer Wound #7 Right,Anterior Lower Leg: Hydrafera Blue Ready Transfer Wound #8 Right,Proximal,Medial,Anterior Lower Leg: Hydrafera Blue Ready Transfer Secondary Dressing: Wound #5 Left,Lateral Lower Leg: ABD pad XtraSorb - where needed Wound #7 Right,Anterior Lower Leg: ABD pad XtraSorb - where needed Wound #8 Right,Proximal,Medial,Anterior Lower Leg: ABD pad XtraSorb - where needed Dressing Change Frequency: Wound #5 Left,Lateral Lower Leg: Other: - twice weekly Wound #8 Right,Proximal,Medial,Anterior Lower Leg: Other: - twice weekly Wound #7 Right,Anterior Lower Leg: Other: -  twice weekly Follow-up Appointments: Wound #5 Left,Lateral Lower Leg: Return Appointment in 1 week. Nurse Visit as needed Wound #8 Right,Proximal,Medial,Anterior Lower Leg: Return Appointment in 1 week. Nurse Visit as needed Wound #7 Right,Anterior Lower Leg: Return Appointment in 1 week. Nurse Visit as needed Edema Control: LEVANTE, SIMONES ([id number]) Wound #5 Left,Lateral Lower Leg: 4 Layer Compression System - Bilateral - please anchor with unna Elevate legs to the level of the heart and pump ankles as often as possible Wound #7 Right,Anterior Lower Leg: 4 Layer Compression System - Bilateral - please anchor with unna Elevate legs to the level of the heart and pump ankles as often as possible Wound #8 Right,Proximal,Medial,Anterior Lower Leg: 4 Layer Compression System - Bilateral - please anchor with unna Elevate legs to the level of the heart and pump ankles as often as possible Home Health: Wound #5 Left,Lateral Lower Leg: [place] Nurse may visit PRN to address patient s wound care needs. FACE TO FACE ENCOUNTER: MEDICARE and MEDICAID PATIENTS: I certify that this patient is under my care and that I had a face-to-face encounter that meets the physician face-to-face encounter requirements with this patient on this date. The encounter with the patient was in whole or in part for the following MEDICAL CONDITION: (primary reason for [place]) MEDICAL NECESSITY: I certify, that based on my findings, NURSING services are a medically necessary home health service. HOME BOUND STATUS: I certify that my clinical findings support that this patient is homebound (i.e., Due to illness or injury, pt requires aid of supportive devices such as crutches, cane, wheelchairs, walkers, the use of special transportation or the assistance of another person to leave their place of residence. There is a normal inability to leave the home and doing so requires  considerable and taxing effort. Other absences are for medical reasons / religious services and are infrequent or of short duration when for other reasons). If current dressing causes regression in wound condition, may D/C ordered dressing product/s and apply Normal Saline Moist Dressing daily until next [place] / Other MD appointment. [place] of regression in wound condition at [phone]. Please direct any NON-WOUND related issues/requests for orders to patient's Primary Care Physician Wound #7 Right,Anterior Lower Leg: [place] Nurse may visit PRN to address patient s wound care  needs. FACE TO FACE ENCOUNTER: MEDICARE and MEDICAID PATIENTS: I certify that this patient is under my care and that I had a face-to-face encounter that meets the physician face-to-face encounter requirements with this patient on this date. The encounter with the patient was in whole or in part for the following MEDICAL CONDITION: (primary reason for [place]) MEDICAL NECESSITY: I certify, that based on my findings, NURSING services are a medically necessary home health service. HOME BOUND STATUS: I certify that my clinical findings support that this patient is homebound (i.e., Due to illness or injury, pt requires aid of supportive devices such as crutches, cane, wheelchairs, walkers, the use of special transportation or the assistance of another person to leave their place of residence. There is a normal inability to leave the home and doing so requires considerable and taxing effort. Other absences are for medical reasons / religious services and are infrequent or of short duration when for other reasons). If current dressing causes regression in wound condition, may D/C ordered dressing product/s and apply Normal Saline Moist Dressing daily until next [place] / Other MD appointment. [place] of regression in wound  condition at [phone]. Please direct any NON-WOUND related issues/requests for orders to patient's Primary Care Physician Wound #8 Right,Proximal,Medial,Anterior Lower Leg: [place] Nurse may visit PRN to address patient s wound care needs. FACE TO FACE ENCOUNTER: MEDICARE and MEDICAID PATIENTS: I certify that this patient is under my care and that I had a face-to-face encounter that meets the physician face-to-face encounter requirements with this patient on this date. The encounter with the patient was in whole or in part for the following MEDICAL CONDITION: (primary reason for [place]) MEDICAL NECESSITY: I certify, that based on my findings, NURSING services are a medically necessary home health service. HOME BOUND STATUS: I certify that my clinical findings support that this patient is homebound (i.e., Due to illness or injury, pt requires aid of supportive devices such as crutches, cane, wheelchairs, walkers, the use of special transportation or the assistance of another person to leave their place of residence. There is a normal inability to leave the home and doing so requires considerable and taxing effort. Other absences are for medical reasons / religious services and are infrequent or of short duration when for other reasons). If current dressing causes regression in wound condition, may D/C ordered dressing product/s and apply Normal Saline Moist Dressing daily until next [place] / Other MD appointment. [place] of regression in wound condition at [phone]. Please direct any NON-WOUND related issues/requests for orders to patient's Primary Care Physician 1. I am get a recommend at this point that we go ahead and continue with the [facility] which I think is good to be the best way to go for him. It may hopefully help to debride some of the wound surface away as well. 2. We will also use Xtrasorb for  drainage control. 3. We will also continue with a 4-layer compression wrap bilaterally with Unna to anchor. We will see patient back for reevaluation in 1 week here in the clinic. If anything worsens or changes patient will contact our office for additional recommendations. Electronic Signature(s) Signed: 07/20/2019 5:52:04 PM By: Worthy Keeler PA-C Entered By: Worthy Keeler on 07/20/2019 17:52:04 [name] [name] ([id number]) -------------------------------------------------------------------------------- SuperBill Details Patient Name: [name] [name] Date of Service: 07/20/2019 Medical Record Number: [id number] Patient Account Number: [id number] Date of Birth/Sex: 04-Aug-1960 (  59 y.o. M) Treating RN: Montey Hora Primary Care Provider: Laverna Peace Other Clinician: Referring Provider: Laverna Peace Treating Provider/Extender: Melburn Hake, Devaris Quirk Weeks in Treatment: 11 Diagnosis Coding ICD-10 Codes Code Description I87.2 Venous insufficiency (chronic) (peripheral) I89.0 Lymphedema, not elsewhere classified L97.812 Non-pressure chronic ulcer of other part of right lower leg with fat layer exposed L97.822 Non-pressure chronic ulcer of other part of left lower leg with fat layer exposed I10 Essential (primary) hypertension J44.9 Chronic obstructive pulmonary disease, unspecified Z79.01 Long term (current) use of anticoagulants I48.0 Paroxysmal atrial fibrillation Z86.711 Personal history of pulmonary embolism I50.42 Chronic combined systolic (congestive) and diastolic (congestive) heart failure Facility Procedures CPT4: Description Modifier Quantity Code [id number] BILATERAL: Application of multi-layer venous compression system; leg (below knee), including 1 ankle and foot. Physician Procedures CPT4 Code: [id number] Description: 20740 - WC PHYS LEVEL 3 - EST PT Modifier: Quantity: 1 CPT4 Code: Description: ICD-10 Diagnosis Description I87.2 Venous insufficiency  (chronic) (peripheral) I89.0 Lymphedema, not elsewhere classified H79.641 Non-pressure chronic ulcer of other part of right lower leg with fat la L97.822 Non-pressure chronic ulcer of  other part of left lower leg with fat lay Modifier: yer exposed er exposed Quantity: Electronic Signature(s) Signed: 07/20/2019 5:52:30 PM By: Worthy Keeler PA-C Previous Signature: 07/20/2019 9:11:42 AM Version By: Montey Hora Entered By: Worthy Keeler on 07/20/2019 17:52:29

## 2019-07-23 ENCOUNTER — Ambulatory Visit: Payer: Medicaid Other | Admitting: Family

## 2019-07-23 DIAGNOSIS — L97811 Non-pressure chronic ulcer of other part of right lower leg limited to breakdown of skin: Secondary | ICD-10-CM | POA: Diagnosis not present

## 2019-07-23 DIAGNOSIS — M10041 Idiopathic gout, right hand: Secondary | ICD-10-CM | POA: Diagnosis not present

## 2019-07-23 DIAGNOSIS — G510 Bell's palsy: Secondary | ICD-10-CM | POA: Diagnosis not present

## 2019-07-23 DIAGNOSIS — E1151 Type 2 diabetes mellitus with diabetic peripheral angiopathy without gangrene: Secondary | ICD-10-CM | POA: Diagnosis not present

## 2019-07-23 DIAGNOSIS — M1611 Unilateral primary osteoarthritis, right hip: Secondary | ICD-10-CM | POA: Diagnosis not present

## 2019-07-23 DIAGNOSIS — G894 Chronic pain syndrome: Secondary | ICD-10-CM | POA: Diagnosis not present

## 2019-07-23 DIAGNOSIS — F321 Major depressive disorder, single episode, moderate: Secondary | ICD-10-CM | POA: Diagnosis not present

## 2019-07-23 DIAGNOSIS — Z792 Long term (current) use of antibiotics: Secondary | ICD-10-CM | POA: Diagnosis not present

## 2019-07-23 DIAGNOSIS — I2510 Atherosclerotic heart disease of native coronary artery without angina pectoris: Secondary | ICD-10-CM | POA: Diagnosis not present

## 2019-07-23 DIAGNOSIS — M545 Low back pain: Secondary | ICD-10-CM | POA: Diagnosis not present

## 2019-07-23 DIAGNOSIS — Z86718 Personal history of other venous thrombosis and embolism: Secondary | ICD-10-CM | POA: Diagnosis not present

## 2019-07-23 DIAGNOSIS — E785 Hyperlipidemia, unspecified: Secondary | ICD-10-CM | POA: Diagnosis not present

## 2019-07-23 DIAGNOSIS — L97821 Non-pressure chronic ulcer of other part of left lower leg limited to breakdown of skin: Secondary | ICD-10-CM | POA: Diagnosis not present

## 2019-07-23 DIAGNOSIS — I110 Hypertensive heart disease with heart failure: Secondary | ICD-10-CM | POA: Diagnosis not present

## 2019-07-23 DIAGNOSIS — I5022 Chronic systolic (congestive) heart failure: Secondary | ICD-10-CM | POA: Diagnosis not present

## 2019-07-23 DIAGNOSIS — I872 Venous insufficiency (chronic) (peripheral): Secondary | ICD-10-CM | POA: Diagnosis not present

## 2019-07-23 DIAGNOSIS — J449 Chronic obstructive pulmonary disease, unspecified: Secondary | ICD-10-CM | POA: Diagnosis not present

## 2019-07-23 DIAGNOSIS — Z6841 Body Mass Index (BMI) 40.0 and over, adult: Secondary | ICD-10-CM | POA: Diagnosis not present

## 2019-07-23 DIAGNOSIS — G4733 Obstructive sleep apnea (adult) (pediatric): Secondary | ICD-10-CM | POA: Diagnosis not present

## 2019-07-23 DIAGNOSIS — E662 Morbid (severe) obesity with alveolar hypoventilation: Secondary | ICD-10-CM | POA: Diagnosis not present

## 2019-07-23 DIAGNOSIS — I4819 Other persistent atrial fibrillation: Secondary | ICD-10-CM | POA: Diagnosis not present

## 2019-07-23 DIAGNOSIS — I890 Lymphedema, not elsewhere classified: Secondary | ICD-10-CM | POA: Diagnosis not present

## 2019-07-23 DIAGNOSIS — Z7901 Long term (current) use of anticoagulants: Secondary | ICD-10-CM | POA: Diagnosis not present

## 2019-07-23 DIAGNOSIS — G8194 Hemiplegia, unspecified affecting left nondominant side: Secondary | ICD-10-CM | POA: Diagnosis not present

## 2019-07-23 DIAGNOSIS — D649 Anemia, unspecified: Secondary | ICD-10-CM | POA: Diagnosis not present

## 2019-07-24 ENCOUNTER — Encounter: Payer: Self-pay | Admitting: Family

## 2019-07-25 DIAGNOSIS — I5022 Chronic systolic (congestive) heart failure: Secondary | ICD-10-CM | POA: Diagnosis not present

## 2019-07-25 DIAGNOSIS — I4819 Other persistent atrial fibrillation: Secondary | ICD-10-CM | POA: Diagnosis not present

## 2019-07-25 DIAGNOSIS — M1611 Unilateral primary osteoarthritis, right hip: Secondary | ICD-10-CM | POA: Diagnosis not present

## 2019-07-25 DIAGNOSIS — J449 Chronic obstructive pulmonary disease, unspecified: Secondary | ICD-10-CM | POA: Diagnosis not present

## 2019-07-25 DIAGNOSIS — I890 Lymphedema, not elsewhere classified: Secondary | ICD-10-CM | POA: Diagnosis not present

## 2019-07-25 DIAGNOSIS — I25118 Atherosclerotic heart disease of native coronary artery with other forms of angina pectoris: Secondary | ICD-10-CM | POA: Diagnosis not present

## 2019-07-25 DIAGNOSIS — E1151 Type 2 diabetes mellitus with diabetic peripheral angiopathy without gangrene: Secondary | ICD-10-CM | POA: Diagnosis not present

## 2019-07-25 DIAGNOSIS — I2510 Atherosclerotic heart disease of native coronary artery without angina pectoris: Secondary | ICD-10-CM | POA: Diagnosis not present

## 2019-07-25 DIAGNOSIS — E785 Hyperlipidemia, unspecified: Secondary | ICD-10-CM | POA: Diagnosis not present

## 2019-07-25 DIAGNOSIS — F321 Major depressive disorder, single episode, moderate: Secondary | ICD-10-CM | POA: Diagnosis not present

## 2019-07-25 DIAGNOSIS — Z792 Long term (current) use of antibiotics: Secondary | ICD-10-CM | POA: Diagnosis not present

## 2019-07-25 DIAGNOSIS — I482 Chronic atrial fibrillation, unspecified: Secondary | ICD-10-CM | POA: Diagnosis not present

## 2019-07-25 DIAGNOSIS — G4733 Obstructive sleep apnea (adult) (pediatric): Secondary | ICD-10-CM | POA: Diagnosis not present

## 2019-07-25 DIAGNOSIS — L97811 Non-pressure chronic ulcer of other part of right lower leg limited to breakdown of skin: Secondary | ICD-10-CM | POA: Diagnosis not present

## 2019-07-25 DIAGNOSIS — I110 Hypertensive heart disease with heart failure: Secondary | ICD-10-CM | POA: Diagnosis not present

## 2019-07-25 DIAGNOSIS — G8194 Hemiplegia, unspecified affecting left nondominant side: Secondary | ICD-10-CM | POA: Diagnosis not present

## 2019-07-25 DIAGNOSIS — Z86718 Personal history of other venous thrombosis and embolism: Secondary | ICD-10-CM | POA: Diagnosis not present

## 2019-07-25 DIAGNOSIS — R079 Chest pain, unspecified: Secondary | ICD-10-CM | POA: Diagnosis not present

## 2019-07-25 DIAGNOSIS — D649 Anemia, unspecified: Secondary | ICD-10-CM | POA: Diagnosis not present

## 2019-07-25 DIAGNOSIS — M10041 Idiopathic gout, right hand: Secondary | ICD-10-CM | POA: Diagnosis not present

## 2019-07-25 DIAGNOSIS — I872 Venous insufficiency (chronic) (peripheral): Secondary | ICD-10-CM | POA: Diagnosis not present

## 2019-07-25 DIAGNOSIS — M545 Low back pain: Secondary | ICD-10-CM | POA: Diagnosis not present

## 2019-07-25 DIAGNOSIS — Z6841 Body Mass Index (BMI) 40.0 and over, adult: Secondary | ICD-10-CM | POA: Diagnosis not present

## 2019-07-25 DIAGNOSIS — I5023 Acute on chronic systolic (congestive) heart failure: Secondary | ICD-10-CM | POA: Diagnosis not present

## 2019-07-25 DIAGNOSIS — G894 Chronic pain syndrome: Secondary | ICD-10-CM | POA: Diagnosis not present

## 2019-07-25 DIAGNOSIS — G510 Bell's palsy: Secondary | ICD-10-CM | POA: Diagnosis not present

## 2019-07-25 DIAGNOSIS — Z7901 Long term (current) use of anticoagulants: Secondary | ICD-10-CM | POA: Diagnosis not present

## 2019-07-25 DIAGNOSIS — L97821 Non-pressure chronic ulcer of other part of left lower leg limited to breakdown of skin: Secondary | ICD-10-CM | POA: Diagnosis not present

## 2019-07-25 DIAGNOSIS — E662 Morbid (severe) obesity with alveolar hypoventilation: Secondary | ICD-10-CM | POA: Diagnosis not present

## 2019-07-25 NOTE — Progress Notes (Signed)
BERNIS, SCHREUR ([id number]) Visit Report for 07/20/2019 Arrival Information Details Patient Name: [name] [name], [name] [name] Date of Service: 07/20/2019 8:00 AM Medical Record Number: [id number] Patient Account Number: [id number] Date of Birth/Sex: 1960/09/28 (59 y.o. M) Treating RN: Huel Coventry Primary Care Genever Hentges: Marvell Fuller Other Clinician: Referring Jarmal Lewelling: Marvell Fuller Treating Raelene Trew/Extender: Linwood Dibbles, HOYT Weeks in Treatment: 11 Visit Information History Since Last Visit Has Dressing in Place as Prescribed: Yes Patient Arrived: Wheel Chair Pain Present Now: Yes Arrival Time: 08:09 Accompanied By: self Transfer Assistance: None Patient Identification Verified: Yes Secondary Verification Process Completed: Yes Patient Has Alerts: Yes Patient Alerts: Patient on Blood Thinner Warfarin Electronic Signature(s) Signed: 07/25/2019 7:21:37 AM By: Elliot Gurney, BSN, RN, CWS, Kim RN, BSN Entered By: Elliot Gurney, BSN, RN, CWS, Kim on 07/20/2019 08:09:31 [name] [name] ([id number]) -------------------------------------------------------------------------------- Compression Therapy Details Patient Name: [name] [name] Date of Service: 07/20/2019 8:00 AM Medical Record Number: [id number] Patient Account Number: [id number] Date of Birth/Sex: 1960-08-22 (58 y.o. M) Treating RN: Curtis Sites Primary Care Sonna Lipsky: Marvell Fuller Other Clinician: Referring Araina Butrick: Marvell Fuller Treating Abigal Choung/Extender: STONE III, HOYT Weeks in Treatment: 11 Compression Therapy Performed for Wound Assessment: Wound #5 Left,Lateral Lower Leg Performed By: Clinician Curtis Sites, RN Compression Type: Four Layer Post Procedure Diagnosis Same as Pre-procedure Electronic Signature(s) Signed: 07/20/2019 4:32:38 PM By: Curtis Sites Entered By: Curtis Sites on 07/20/2019 08:39:03 [name] [name]  ([id number]) -------------------------------------------------------------------------------- Compression Therapy Details Patient Name: [name] [name] Date of Service: 07/20/2019 8:00 AM Medical Record Number: [id number] Patient Account Number: [id number] Date of Birth/Sex: September 18, 1960 (59 y.o. M) Treating RN: Curtis Sites Primary Care Abrie Egloff: Marvell Fuller Other Clinician: Referring Janise Gora: Marvell Fuller Treating Priyah Schmuck/Extender: Linwood Dibbles, HOYT Weeks in Treatment: 11 Compression Therapy Performed for Wound Assessment: Wound #7 Right,Anterior Lower Leg Performed By: Clinician Curtis Sites, RN Compression Type: Four Layer Post Procedure Diagnosis Same as Pre-procedure Electronic Signature(s) Signed: 07/20/2019 4:32:38 PM By: Curtis Sites Entered By: Curtis Sites on 07/20/2019 08:39:03 [name] [name] ([id number]) -------------------------------------------------------------------------------- Compression Therapy Details Patient Name: [name] [name] Date of Service: 07/20/2019 8:00 AM Medical Record Number: [id number] Patient Account Number: [id number] Date of Birth/Sex: Jan 17, 1961 (59 y.o. M) Treating RN: Curtis Sites Primary Care Chapin Arduini: Marvell Fuller Other Clinician: Referring Khrystian Schauf: Marvell Fuller Treating Glena Pharris/Extender: STONE III, HOYT Weeks in Treatment: 11 Compression Therapy Performed for Wound Assessment: Wound #8 Right,Proximal,Medial,Anterior Lower Leg Performed By: Clinician Curtis Sites, RN Compression Type: Four Layer Post Procedure Diagnosis Same as Pre-procedure Electronic Signature(s) Signed: 07/20/2019 4:32:38 PM By: Curtis Sites Entered By: Curtis Sites on 07/20/2019 08:39:03 [name] [name] ([id number]) -------------------------------------------------------------------------------- Encounter Discharge Information Details Patient Name: [name] [name] Date of Service: 07/20/2019 8:00 AM Medical Record Number:  [id number] Patient Account Number: [id number] Date of Birth/Sex: June 01, 1960 (59 y.o. M) Treating RN: Curtis Sites Primary Care Kendric Sindelar: Marvell Fuller Other Clinician: Referring Stevi Hollinshead: Marvell Fuller Treating Lilliahna Schubring/Extender: Linwood Dibbles, HOYT Weeks in Treatment: 11 Encounter Discharge Information Items Discharge Condition: Stable Ambulatory Status: Wheelchair Discharge Destination: Home Transportation: Private Auto Accompanied By: self Schedule Follow-up Appointment: Yes Clinical Summary of Care: Electronic Signature(s) Signed: 07/20/2019 9:15:23 AM By: Curtis Sites Entered By: Curtis Sites on 07/20/2019 09:15:23 [name] [name] ([id number]) -------------------------------------------------------------------------------- Lower Extremity Assessment Details Patient Name: [name] [name] Date of Service: 07/20/2019 8:00 AM Medical Record Number: [id number] Patient Account Number: [id number] Date of Birth/Sex: 10/23/1960 (59 y.o. M) Treating RN: Huel Coventry Primary Care Miliano Cotten: Marvell Fuller Other Clinician: Referring Nyle Limb: Marvell Fuller Treating Aniceto Kyser/Extender: STONE III, HOYT Weeks in Treatment: 11 Edema Assessment Assessed: [Left: No] [Right: No] [Left: Edema] [Right: :]  Calf Left: Right: Point of Measurement: 31 cm From Medial Instep 56 cm 65 cm Ankle Left: Right: Point of Measurement: 13 cm From Medial Instep 34 cm 33 cm Vascular Assessment Pulses: Dorsalis Pedis Palpable: [Left:Yes] [Right:Yes] Electronic Signature(s) Signed: 07/25/2019 7:21:37 AM By: Gretta Cool, BSN, RN, CWS, Kim RN, BSN Entered By: Gretta Cool, BSN, RN, CWS, Kim on 07/20/2019 08:27:59 [name] [name] ([id number]) -------------------------------------------------------------------------------- Multi Wound Chart Details Patient Name: [name] [name] Date of Service: 07/20/2019 8:00 AM Medical Record Number: [id number] Patient Account Number: [id number] Date of Birth/Sex: 08-May-1960  (58 y.o. 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 Comorbid History: Asthma, Chronic Obstructive Asthma, Chronic Obstructive Asthma, Chronic Obstructive Pulmonary Disease (COPD), Sleep Pulmonary Disease (COPD), Sleep Pulmonary Disease (COPD), Sleep Apnea, Arrhythmia, Congestive Apnea, Arrhythmia, Congestive Apnea, Arrhythmia, Congestive Heart Failure, Hypertension, Heart Failure, Hypertension, Heart Failure, Hypertension, Myocardial Infarction, Type II Myocardial Infarction, Type II Myocardial Infarction, Type II Diabetes Diabetes Diabetes Date Acquired: 02/12/2019 05/17/2019 07/16/2019 Weeks of Treatment: 11 6 0 Wound Status: Open Open Open Clustered Wound: Yes No No Clustered Quantity: 2 N/A N/A Measurements L x W x D (cm) 9x10.6x0.3 5.5x6.5x0.2 1.2x1.5x0.1 Area (cm) : 74.927 28.078 1.414 Volume (cm) : 22.478 5.616 0.141 % Reduction in Area: -278.60% 63.50% 0.00% % Reduction in Volume: -1035.80% 63.50% 0.00% Classification: Full Thickness Without Exposed Full Thickness Without Exposed Grade 2 Support Structures Support Structures Exudate Amount: Large Medium Large Exudate Type: Serosanguineous Serosanguineous Serous Exudate Color: red, brown red, brown amber Wound Margin: Flat and Intact Flat and Intact Flat and Intact Granulation Amount: Small (1-33%) Small (1-33%) Medium  (34-66%) Granulation Quality: Pink Pink N/A Necrotic Amount: Large (67-100%) Large (67-100%) Medium (34-66%) Exposed Structures: Fat Layer (Subcutaneous Tissue) Fat Layer (Subcutaneous Tissue) Fascia: No Exposed: Yes Exposed: Yes Fat Layer (Subcutaneous Tissue) Fascia: No Fascia: No Exposed: No Tendon: No Tendon: No Tendon: No Muscle: No Muscle: No Muscle: No Joint: No Joint: No Joint: No Bone: No Bone: No Bone: No Epithelialization: None None None Treatment Notes [name] [name], [name] [name] ([id number]) Electronic Signature(s) Signed: 07/20/2019 4:32:38 PM By: Montey Hora Entered By: Montey Hora on 07/20/2019 08:38:48 [name] [name] ([id number]) -------------------------------------------------------------------------------- Multi-Disciplinary Care Plan Details Patient Name: [name] [name] Date of Service: 07/20/2019 8:00 AM Medical Record Number: [id number] Patient Account Number: [id number] Date of Birth/Sex: 03-25-1960 (59 y.o. M) Treating RN: Montey Hora Primary Care Parley Pidcock: Laverna Peace Other Clinician: Referring Jenne Sellinger: Laverna Peace Treating Sinia Antosh/Extender: Melburn Hake, HOYT Weeks in Treatment: 11 Active Inactive Abuse / Safety / Falls / Self Care Management Nursing Diagnoses: Potential for falls Goals: Patient will remain injury free related to falls Date Initiated: 05/04/2019 Target Resolution Date: 07/21/2019 Goal Status: Active Interventions: Assess fall risk on admission and as needed Provide education on basic hygiene Notes: Nutrition Nursing Diagnoses: Impaired glucose control: actual or potential Goals: Patient/caregiver agrees to and verbalizes understanding of need to use nutritional supplements and/or vitamins as prescribed Date Initiated: 05/04/2019 Target Resolution Date: 07/21/2019 Goal Status: Active Interventions: Assess patient nutrition upon admission and as needed per policy Notes: Orientation to the Wound Care  Program Nursing Diagnoses: Knowledge  deficit related to the wound healing center program Goals: Patient/caregiver will verbalize understanding of the Wound Healing Center Program Date Initiated: 05/04/2019 Target Resolution Date: 07/21/2019 Goal Status: Active Interventions: Provide education on orientation to the wound center Notes: Venous Leg Ulcer Nursing Diagnoses: Potential for venous Insuffiency (use before diagnosis confirmed) Goals: Patient will maintain optimal edema control Date Initiated: 05/04/2019 Target Resolution Date: 07/21/2019 Goal Status: Active [name] [name], [name] [name] ([id number]) Interventions: Compression as ordered Notes: Wound/Skin Impairment Nursing Diagnoses: Impaired tissue integrity Goals: Ulcer/skin breakdown will heal within 14 weeks Date Initiated: 05/04/2019 Target Resolution Date: 07/21/2019 Goal Status: Active Interventions: Assess patient/caregiver ability to obtain necessary supplies Assess patient/caregiver ability to perform ulcer/skin care regimen upon admission and as needed Assess ulceration(s) every visit Notes: Electronic Signature(s) Signed: 07/20/2019 4:32:38 PM By: Curtis Sites Entered By: Curtis Sites on 07/20/2019 08:38:43 [name] [name] ([id number]) -------------------------------------------------------------------------------- Pain Assessment Details Patient Name: [name] [name] Date of Service: 07/20/2019 8:00 AM Medical Record Number: [id number] Patient Account Number: [id number] Date of Birth/Sex: 14-Jul-1960 (59 y.o. M) Treating RN: Huel Coventry Primary Care Deniz Eskridge: Marvell Fuller Other Clinician: Referring Alyia Lacerte: Marvell Fuller Treating Siriah Treat/Extender: Linwood Dibbles, HOYT Weeks in Treatment: 11 Active Problems Location of Pain Severity and Description of Pain Patient Has Paino No Site Locations Pain Management and Medication Current Pain Management: Notes Patient states he hurts all over. Legs are burning and  throbbing. Electronic Signature(s) Signed: 07/25/2019 7:21:37 AM By: Elliot Gurney, BSN, RN, CWS, Kim RN, BSN Entered By: Elliot Gurney, BSN, RN, CWS, Kim on 07/20/2019 08:13:02 [name] [name] ([id number]) -------------------------------------------------------------------------------- Patient/Caregiver Education Details Patient Name: [name] [name] Date of Service: 07/20/2019 8:00 AM Medical Record Number: [id number] Patient Account Number: [id number] Date of Birth/Gender: 1960/08/01 (59 y.o. M) Treating RN: Curtis Sites Primary Care Physician: Marvell Fuller Other Clinician: Referring Physician: Marvell Fuller Treating Physician/Extender: Skeet Simmer in Treatment: 11 Education Assessment Education Provided To: Patient Education Topics Provided Venous: Handouts: Other: leg elevation Methods: Explain/Verbal Responses: State content correctly Electronic Signature(s) Signed: 07/20/2019 4:32:38 PM By: Curtis Sites Entered By: Curtis Sites on 07/20/2019 09:12:07 [name] [name] ([id number]) -------------------------------------------------------------------------------- Wound Assessment Details Patient Name: [name] [name] Date of Service: 07/20/2019 8:00 AM Medical Record Number: [id number] Patient Account Number: [id number] Date of Birth/Sex: 1960/02/20 (59 y.o. M) Treating RN: Huel Coventry Primary Care Lowell Makara: Marvell Fuller Other Clinician: Referring Toyoko Silos: Marvell Fuller Treating Latrena Benegas/Extender: Linwood Dibbles, HOYT Weeks in Treatment: 11 Wound Status Wound Number: 5 Primary Lymphedema Etiology: Wound Location: Left, Lateral Lower Leg Wound Open Wounding Event: Gradually Appeared Status: Date Acquired: 02/12/2019 Comorbid Asthma, Chronic Obstructive Pulmonary Disease (COPD), Weeks Of Treatment: 11 History: Sleep Apnea, Arrhythmia, Congestive Heart Failure, Clustered Wound: Yes Hypertension, Myocardial Infarction, Type II Diabetes Photos Wound  Measurements Length: (cm) 9 Width: (cm) 10.6 Depth: (cm) 0.3 Clustered Quantity: 2 Area: (cm) 74.927 Volume: (cm) 22.478 % Reduction in Area: -278.6% % Reduction in Volume: -1035.8% Epithelialization: None Tunneling: No Undermining: No Wound Description Classification: Full Thickness Without Exposed Support Struct Wound Margin: Flat and Intact Exudate Amount: Large Exudate Type: Serosanguineous Exudate Color: red, brown ures Foul Odor After Cleansing: No Slough/Fibrino Yes Wound Bed Granulation Amount: Small (1-33%) Exposed Structure Granulation Quality: Pink Fascia Exposed: No Necrotic Amount: Large (67-100%) Fat Layer (Subcutaneous Tissue) Exposed: Yes Necrotic Quality: Adherent Slough Tendon Exposed: No Muscle Exposed: No Joint Exposed: No Bone Exposed: No Treatment Notes Wound #5 (Left, Lateral Lower Leg) Notes hydrofera blue, xsorb, 4 layer bilateral [name] [name], [name] [name] ([id number]) Electronic Signature(s) Signed: 07/25/2019 7:21:37 AM By: Elliot Gurney, BSN, RN, CWS, Kim RN,  BSN Entered By: Elliot Gurney, BSN, RN, CWS, Kim on 07/20/2019 08:24:19 [name] [name], [name] [name] ([id number]) -------------------------------------------------------------------------------- Wound Assessment Details Patient Name: [name] [name] Date of Service: 07/20/2019 8:00 AM Medical Record Number: [id number] Patient Account Number: [id number] Date of Birth/Sex: 27-Mar-1960 (59 y.o. M) Treating RN: Huel Coventry Primary Care Kalayah Leske: Marvell Fuller Other Clinician: Referring Zina Pitzer: Marvell Fuller Treating Kevia Zaucha/Extender: Linwood Dibbles, HOYT Weeks in Treatment: 11 Wound Status Wound Number: 7 Primary Venous Leg Ulcer Etiology: Wound Location: Right, Anterior Lower Leg Wound Open Wounding Event: Gradually Appeared Status: Date Acquired: 05/17/2019 Comorbid Asthma, Chronic Obstructive Pulmonary Disease (COPD), Weeks Of Treatment: 6 History: Sleep Apnea, Arrhythmia, Congestive Heart Failure, Clustered  Wound: No Hypertension, Myocardial Infarction, Type II Diabetes Photos Wound Measurements Length: (cm) 5.5 Width: (cm) 6.5 Depth: (cm) 0.2 Area: (cm) 28.078 Volume: (cm) 5.616 % Reduction in Area: 63.5% % Reduction in Volume: 63.5% Epithelialization: None Tunneling: No Undermining: No Wound Description Classification: Full Thickness Without Exposed Support Struct Wound Margin: Flat and Intact Exudate Amount: Medium Exudate Type: Serosanguineous Exudate Color: red, brown ures Foul Odor After Cleansing: No Slough/Fibrino Yes Wound Bed Granulation Amount: Small (1-33%) Exposed Structure Granulation Quality: Pink Fascia Exposed: No Necrotic Amount: Large (67-100%) Fat Layer (Subcutaneous Tissue) Exposed: Yes Necrotic Quality: Adherent Slough Tendon Exposed: No Muscle Exposed: No Joint Exposed: No Bone Exposed: No Treatment Notes Wound #7 (Right, Anterior Lower Leg) Notes hydrofera blue, xsorb, 4 layer bilateral Electronic Signature([name] [name], [name] [name] ([id number]) Signed: 07/25/2019 7:21:37 AM By: Elliot Gurney, BSN, RN, CWS, Kim RN, BSN Entered By: Elliot Gurney, BSN, RN, CWS, Kim on 07/20/2019 08:25:22 [name] [name] ([id number]) -------------------------------------------------------------------------------- Wound Assessment Details Patient Name: [name] [name] Date of Service: 07/20/2019 8:00 AM Medical Record Number: [id number] Patient Account Number: [id number] Date of Birth/Sex: 1960/10/02 (58 y.o. M) Treating RN: Huel Coventry Primary Care Arriyanna Mersch: Marvell Fuller Other Clinician: Referring Thurston Brendlinger: Marvell Fuller Treating Shawan Tosh/Extender: Linwood Dibbles, HOYT Weeks in Treatment: 11 Wound Status Wound Number: 8 Primary Diabetic Wound/Ulcer of the Lower Extremity Etiology: Wound Location: Right, Proximal, Medial, Anterior Lower Leg Wound Open Wounding Event: Gradually Appeared Status: Date Acquired: 07/16/2019 Comorbid Asthma, Chronic Obstructive Pulmonary Disease  (COPD), Weeks Of Treatment: 0 History: Sleep Apnea, Arrhythmia, Congestive Heart Failure, Clustered Wound: No Hypertension, Myocardial Infarction, Type II Diabetes Photos Wound Measurements Length: (cm) 1.2 Width: (cm) 1.5 Depth: (cm) 0.1 Area: (cm) 1.414 Volume: (cm) 0.141 % Reduction in Area: 0% % Reduction in Volume: 0% Epithelialization: None Tunneling: No Undermining: No Wound Description Classification: Grade 2 Wound Margin: Flat and Intact Exudate Amount: Large Exudate Type: Serous Exudate Color: amber Foul Odor After Cleansing: No Slough/Fibrino Yes Wound Bed Granulation Amount: Medium (34-66%) Exposed Structure Necrotic Amount: Medium (34-66%) Fascia Exposed: No Necrotic Quality: Adherent Slough Fat Layer (Subcutaneous Tissue) Exposed: No Tendon Exposed: No Muscle Exposed: No Joint Exposed: No Bone Exposed: No Treatment Notes Wound #8 (Right, Proximal, Medial, Anterior Lower Leg) Notes hydrofera blue, xsorb, 4 layer bilateral Electronic Signature(sJAMIAN, [name] [name] ([id number]) Signed: 07/25/2019 7:21:37 AM By: Elliot Gurney, BSN, RN, CWS, Kim RN, BSN Entered By: Elliot Gurney, BSN, RN, CWS, Kim on 07/20/2019 08:23:08 [name] [name] ([id number]) -------------------------------------------------------------------------------- Vitals Details Patient Name: [name] [name] Date of Service: 07/20/2019 8:00 AM Medical Record Number: [id number] Patient Account Number: [id number] Date of Birth/Sex: [birth year] (58 y.o. M) Treating RN: Huel Coventry Primary Care Ferron Ishmael: Marvell Fuller Other Clinician: Referring Amen Staszak: Marvell Fuller Treating Benjamyn Hestand/Extender: Linwood Dibbles, HOYT Weeks in Treatment: 11 Vital Signs Time Taken: 08:10 Temperature (F): 97.9 Height (in): 68 Pulse (bpm): 69 Weight (lbs): 400 Respiratory Rate (breaths/min):  18 Body Mass Index (BMI): 60.8 Blood Pressure (mmHg): 125/62 Reference Range: 80 - 120 mg / dl Electronic Signature(s) Signed:  07/25/2019 7:21:37 AM By: Elliot Gurney, BSN, RN, CWS, Kim RN, BSN Entered By: Elliot Gurney, BSN, RN, CWS, Kim on 07/20/2019 08:12:19

## 2019-07-27 ENCOUNTER — Other Ambulatory Visit: Payer: Self-pay

## 2019-07-27 ENCOUNTER — Encounter: Payer: Medicaid Other | Admitting: Physician Assistant

## 2019-07-27 DIAGNOSIS — L97822 Non-pressure chronic ulcer of other part of left lower leg with fat layer exposed: Secondary | ICD-10-CM | POA: Diagnosis not present

## 2019-07-27 DIAGNOSIS — Z86711 Personal history of pulmonary embolism: Secondary | ICD-10-CM | POA: Diagnosis not present

## 2019-07-27 DIAGNOSIS — J449 Chronic obstructive pulmonary disease, unspecified: Secondary | ICD-10-CM | POA: Diagnosis not present

## 2019-07-27 DIAGNOSIS — I110 Hypertensive heart disease with heart failure: Secondary | ICD-10-CM | POA: Diagnosis not present

## 2019-07-27 DIAGNOSIS — I872 Venous insufficiency (chronic) (peripheral): Secondary | ICD-10-CM | POA: Diagnosis not present

## 2019-07-27 DIAGNOSIS — L03116 Cellulitis of left lower limb: Secondary | ICD-10-CM | POA: Diagnosis not present

## 2019-07-27 DIAGNOSIS — I890 Lymphedema, not elsewhere classified: Secondary | ICD-10-CM | POA: Diagnosis not present

## 2019-07-27 DIAGNOSIS — L03115 Cellulitis of right lower limb: Secondary | ICD-10-CM | POA: Diagnosis not present

## 2019-07-27 DIAGNOSIS — I5042 Chronic combined systolic (congestive) and diastolic (congestive) heart failure: Secondary | ICD-10-CM | POA: Diagnosis not present

## 2019-07-27 DIAGNOSIS — E11622 Type 2 diabetes mellitus with other skin ulcer: Secondary | ICD-10-CM | POA: Diagnosis not present

## 2019-07-27 DIAGNOSIS — Z6841 Body Mass Index (BMI) 40.0 and over, adult: Secondary | ICD-10-CM | POA: Diagnosis not present

## 2019-07-27 DIAGNOSIS — Z7901 Long term (current) use of anticoagulants: Secondary | ICD-10-CM | POA: Diagnosis not present

## 2019-07-27 DIAGNOSIS — E669 Obesity, unspecified: Secondary | ICD-10-CM | POA: Diagnosis not present

## 2019-07-27 DIAGNOSIS — I252 Old myocardial infarction: Secondary | ICD-10-CM | POA: Diagnosis not present

## 2019-07-27 DIAGNOSIS — I480 Paroxysmal atrial fibrillation: Secondary | ICD-10-CM | POA: Diagnosis not present

## 2019-07-27 DIAGNOSIS — L97812 Non-pressure chronic ulcer of other part of right lower leg with fat layer exposed: Secondary | ICD-10-CM | POA: Diagnosis not present

## 2019-07-27 NOTE — Progress Notes (Addendum)
[name] [name] ([id number]) Visit Report for 07/27/2019 Chief Complaint Document Details Patient Name: [name], [name] Date of Service: 07/27/2019 2:45 PM Medical Record Number: [id number] Patient Account Number: [id number] Date of Birth/Sex: [birth year] (59 y.o. M) Treating RN: Montey Hora Primary Care Provider: Laverna Peace Other Clinician: Referring Provider: Laverna Peace Treating Provider/Extender: Melburn Hake, Trissa Molina Weeks in Treatment: 12 Information Obtained from: Patient Chief Complaint Bilateral LE ulcers Electronic Signature(s) Signed: 07/27/2019 2:05:07 PM By: Worthy Keeler PA-C Entered By: Worthy Keeler on 07/27/2019 14:05:06 [name] [name] ([id number]) -------------------------------------------------------------------------------- HPI Details Patient Name: [name] [name] Date of Service: 07/27/2019 2:45 PM Medical Record Number: [id number] Patient Account Number: [id number] Date of Birth/Sex: 1960/04/11 (59 y.o. M) Treating RN: Montey Hora Primary Care Provider: Laverna Peace Other Clinician: Referring Provider: Laverna Peace Treating Provider/Extender: Melburn Hake, Alexander Mcauley Weeks in Treatment: 12 History of Present Illness HPI Description: 01/18/2019 on evaluation today patient presents for initial evaluation here in our clinic concerning issues he is been having with his bilateral lower extremities. He appears based on what I am seeing to have bilateral lower extremity cellulitis along with chronic venous stasis. He has significant pitting edema even in the bottom of his foot. With that being said he also has a history of hypertension, COPD, congestive heart failure, long-term use of anticoagulants due to atrial fibrillation which is paroxysmal as well as having had a pulmonary embolism in the past. With that being said he tells me that he has constant drainage out of his left lower extremity. This is likely due to the fact that again he has  significant swelling pretty much all the time. With that being said in the past 2 weeks this has been getting worse and he tells me at this point that he feels like it is infected is also hurting a lot more than it has been in the past and the drainage is a different color. The left side is definitely worse than the right. Fortunately there is no signs of active infection systemically although I am concerned due to the severity of the infection that I see in the left lower extremity especially. There is a lot of necrotic tissue on the surface of both wounds that at some point is going to need to be cleaned off to allow these to heal but right now obviously this is much too tender for him. No fevers, chills, nausea, vomiting, or diarrhea. 02/02/2019 on evaluation today patient presents for follow-up concerning the wounds over his bilateral lower extremities. He is continuing to manage. He was in the hospital and has been discharged at this point. He was admitted from 01/21/2019 through 01/27/2019. This was due to cellulitis of lower extremity. Nonetheless he does seem to be doing much better at this point. I did review his discharge summary as well he did not appear to have any positive findings on blood cultures. His white blood cell count was never really elevated either which is also good news. He was treated with IV clindamycin for 4 days and then switch to complete a 10-day course at home which she is still working on at this point. They did have him on IV Lasix in the hospital which helped him diurese well on discharge they stated that his weight was 408 pounds which is still higher than his dry weight of 375 to 380 pounds. Upon discharge his torsemide was increased from 40 to 60 mg twice daily and he is to follow-up with cardiology as well. 12/29-Patient returns at 2 weeks  for evaluation and follow-up of his bilateral lower extremity wounds, with lymphedema, venous ulcerations, patient was  recently hospitalized and was aggressively diuresed after which his torsemide has been increased. He follows up with cardiology as well. His left leg stasis wounds are more moist and have maceration of tissue, the right one is less so. We are using silver cell and unna compression given the degree of edema in the legs. Readmission: 05/04/2019 patient presents today for reevaluation here in the clinic I have not seen him since December 2020. Subsequently this is a fairly sick individual with multiple comorbidities who presents for follow-up concerning wounds of his bilateral lower extremities. He tells me that after he was in the hospital following when we saw him last in December that he ended up subsequently being discharged with home health services and they have been coming out and applying Unna boot wraps. With that being said he has used up all of his home health visits for the year and then for the last week has not had anything on his legs as far as the wrap is concerned. Obviously this is not good and he does come in today with increased swelling or at least what I perceived to be along with somewhat dry wounds again he does not seem to be weeping too much at this point which is at least good news but he has significant lymphedema/venous stasis. No fevers, chills, nausea, vomiting, or diarrhea. 06/01/2019 upon evaluation today patient is seen after not having been seen actually since 19 2021. He kept coming for appointment with every time he would come his temperature would be elevated past the point where we could see him. I told him that he needed to go be seen at the ER during 1 of these times due to the fact that obviously this was a fever of unknown origin at that point and my concern was that it could potentially be his legs but again I was not entirely sure as obviously I could not see them due to the restrictions secondary to Covid even bringing the patient into the clinic. Nonetheless  he finally came in today his temperature again was elevated but not to the same degree that it met the 100.4 or above guidelines of having to cancel the appointment. Either way we did have a look at his legs unfortunately he appears to show signs of cellulitis at this point which is probably where the temperature elevations are emanating from. He has significant lymphedema as well as chronic vent venous stasis as well. When I initially told him that is why I told him to go to the ER previously when his temperature was elevated in case it was something with wounds or otherwise he replied "nobody ever told me to go to the ER". I then subsequently reminded him of me coming to the front desk due to his temperature being elevated and advising him to go to the ER to which she replied "I am not going to the ER and I do not think I need to even with this". Nonetheless I am concerned about the infection of his legs at this point he has significant weeping, significant edema, and overall is not doing very well in my opinion. 4/23; the patient arrives in clinic today having napkin stuck on his wounds. He is taking the antibiotic from last week. Last Wednesday he had his INR checked at the Coumadin clinic it was apparently "sky high" and his Coumadin is now on hold.  He is going back to check this again on Monday. His legs were not put back in compression last week out of fear of the extending infection with fever. He has been taking the antibiotics. Culture from last week showed Enterobacter and methicillin resistant staph aureus. He is on on doxycycline which the MRSA was sensitive to Enterobacter was not plated but given the pattern of sensitivities probably the doxycycline was satisfactory. I will extend the doxycycline another 4 days until he is seen next week. From an infection point of view this looks better 06/14/2019 upon evaluation today patient unfortunately continues to have signs of cellulitis on the  lower extremities bilaterally especially on the left. Unfortunately he doesn't seem to be doing much better despite being on doxycycline for a couple of weeks. He was positive for MRSA as well as Enterobacter. Obviously the MRSA is treated by way of the doxycycline the Enterobacter unfortunately is not I'm going to see about initiating Bactrim for him at this point. 5/13; since he was last here he was hospitalized from 5/7 through 5/11. Mostly this was for congestive heart failure. He has an ejection fraction of 25 to 30%. His torsemide dose was increased. While he was there they dealt with the infection in the right leg. He did not take his Bactrim that he was prescribed last time. He was treated with linezolid in the hospital and cefepime and discharged on Bactrim to complete a 2-week course. He was supposed to follow-up with the Coumadin clinic at his cardiologist's office [Dr. End). He has not done this. He tells me he is on Coumadino 5 mg twice a day. The last INR I see is from 5/11 when he was discharged from the hospital it was 2.1 however it was increasing every day they check this. He was thought to have cellulitis from methicillin-resistant staph aureus. KEES, IDROVO ([id number]) 5/21; patient's wound on the right lower leg actually looks a lot better although not much change in size. We used Iodoflex under an Haematologist last week. The [employer] stayed in place. 07/20/2019 upon evaluation today patient actually appears to be doing much better in regard to his legs in general. He does not have the infection that was previously noted which is great news he was in the hospital which he finally did go after things got significantly worse. Fortunately he seems to be doing much better in regard to his overall appearance today in that regard. With that being said he still is experiencing issues currently with wounds over the lower extremities and his wraps do tend to slip down. We probably do need  to use the Unna paste which I think we are doing here but I am not sure the home health nurses are using right now we may need to send this order along as well. 07/27/2019 upon evaluation today patient appears to be doing excellent in regard to his lower extremities all things considered. He still has significant edema noted but the compression wraps do seem to be helping and overall after getting his infection under good control I feel like things have improved dramatically. There does not appear to be any signs of active infection at this time. No fevers, chills, nausea, vomiting, or diarrhea. Electronic Signature(s) Signed: 07/27/2019 3:05:18 PM By: Worthy Keeler PA-C Entered By: Worthy Keeler on 07/27/2019 15:05:18 DJIBRIL, GLOGOWSKI ([id number]) -------------------------------------------------------------------------------- Physical Exam Details Patient Name: [name] [name] Date of Service: 07/27/2019 2:45 PM Medical Record Number: [id number] Patient Account Number: [id number]  Date of Birth/Sex: Jun 20, 1960 (59 y.o. M) Treating RN: Montey Hora Primary Care Provider: Laverna Peace Other Clinician: Referring Provider: Laverna Peace Treating Provider/Extender: STONE III, Armonte Tortorella Weeks in Treatment: 61 Constitutional Well-nourished and well-hydrated in no acute distress. Respiratory normal breathing without difficulty. Psychiatric this patient is able to make decisions and demonstrates good insight into disease process. Alert and Oriented x 3. pleasant and cooperative. Notes Upon inspection patient's wound bed showed signs of good granulation at this point fortunately there is no evidence of active infection which is great news. Overall I am very pleased with where things stand. Electronic Signature(s) Signed: 07/27/2019 3:05:46 PM By: Worthy Keeler PA-C Entered By: Worthy Keeler on 07/27/2019 15:05:46 [name] [name]  ([id number]) -------------------------------------------------------------------------------- Physician Orders Details Patient Name: [name] [name] Date of Service: 07/27/2019 2:45 PM Medical Record Number: [id number] Patient Account Number: [id number] Date of Birth/Sex: March 13, 1960 (59 y.o. M) Treating RN: Montey Hora Primary Care Provider: Laverna Peace Other Clinician: Referring Provider: Laverna Peace Treating Provider/Extender: Melburn Hake, Srinivas Lippman Weeks in Treatment: 12 Verbal / Phone Orders: No Diagnosis Coding ICD-10 Coding Code Description I87.2 Venous insufficiency (chronic) (peripheral) I89.0 Lymphedema, not elsewhere classified L97.812 Non-pressure chronic ulcer of other part of right lower leg with fat layer exposed L97.822 Non-pressure chronic ulcer of other part of left lower leg with fat layer exposed [place] (primary) hypertension J44.9 Chronic obstructive pulmonary disease, unspecified Z79.01 Long term (current) use of anticoagulants I48.0 Paroxysmal atrial fibrillation Z86.711 Personal history of pulmonary embolism I50.42 Chronic combined systolic (congestive) and diastolic (congestive) heart failure Wound Cleansing Wound #5 Left,Lateral Lower Leg o Cleanse wound with mild soap and water o May shower with protection. Wound #7 Right,Anterior Lower Leg o Cleanse wound with mild soap and water o May shower with protection. Wound #8 Right,Proximal,Medial,Anterior Lower Leg o Cleanse wound with mild soap and water o May shower with protection. Primary Wound Dressing Wound #5 Left,Lateral Lower Leg o Hydrafera Blue Ready Transfer Wound #7 Right,Anterior Lower Leg o Hydrafera Blue Ready Transfer Wound #8 Right,Proximal,Medial,Anterior Lower Leg o Hydrafera Blue Ready Transfer Secondary Dressing Wound #5 Left,Lateral Lower Leg o ABD pad - please pad any areas that may rub (especially around the ankles) o XtraSorb - where  needed Wound #7 Right,Anterior Lower Leg o ABD pad - please pad any areas that may rub (especially around the ankles) o XtraSorb - where needed Wound #8 Right,Proximal,Medial,Anterior Lower Leg o ABD pad - please pad any areas that may rub (especially around the ankles) o XtraSorb - where needed Dressing Change Frequency Wound #5 Left,Lateral Lower Leg o Other: - twice weekly Griffie, Chrissie Noa ([id number]) Wound #7 Right,Anterior Lower Leg o Other: - twice weekly Wound #8 Right,Proximal,Medial,Anterior Lower Leg o Other: - twice weekly Follow-up Appointments Wound #5 Left,Lateral Lower Leg o Return Appointment in 1 week. o Nurse Visit as needed Wound #7 Right,Anterior Lower Leg o Return Appointment in 1 week. o Nurse Visit as needed Wound #8 Right,Proximal,Medial,Anterior Lower Leg o Return Appointment in 1 week. o Nurse Visit as needed Edema Control Wound #5 Left,Lateral Lower Leg o 4 Layer Compression System - Bilateral - please anchor with unna - 4 layer wraps are to provide 30-58mHg compression, please wrap tight enough to accomplish this o Elevate legs to the level of the heart and pump ankles as often as possible Wound #7 Right,Anterior Lower Leg o 4 Layer Compression System - Bilateral - please anchor with unna - 4 layer wraps are to provide 30-433mg compression, please wrap tight enough  to accomplish this o Elevate legs to the level of the heart and pump ankles as often as possible Wound #8 Right,Proximal,Medial,Anterior Lower Leg o 4 Layer Compression System - Bilateral - please anchor with unna - 4 layer wraps are to provide 30-54mHg compression, please wrap tight enough to accomplish this o Elevate legs to the level of the heart and pump ankles as often as possible Home Health Wound#5 LGillespieNurse may visit PRN to address patientos wound care needs. o FACE TO  FACE ENCOUNTER: MEDICARE and MEDICAID PATIENTS: I certify that this patient is under my care and that I had a face-to-face encounter that meets the physician face-to-face encounter requirements with this patient on this date. The encounter with the patient was in whole or in part for the following MEDICAL CONDITION: (primary reason for [place] MEDICAL NECESSITY: I certify, that based on my findings, NURSING services are a medically necessary home health service. HOME BOUND STATUS: I certify that my clinical findings support that this patient is homebound (i.e., Due to illness or injury, pt requires aid of supportive devices such as crutches, cane, wheelchairs, walkers, the use of special transportation or the assistance of another person to leave their place of residence. There is a normal inability to leave the home and doing so requires considerable and taxing effort. Other absences are for medical reasons / religious services and are infrequent or of short duration when for other reasons). o If current dressing causes regression in wound condition, may D/C ordered dressing product/s and apply Normal Saline Moist Dressing daily until next [place]/ Other MD appointment. [place] regression in wound condition at [phone] o Please direct any NON-WOUND related issues/requests for orders to patient's Primary Care Physician Wound#7 ROrtingVisits o Home Health Nurse may visit PRN to address patientos wound care needs. o FACE TO FACE ENCOUNTER: MEDICARE and MEDICAID PATIENTS: I certify that this patient is under my care and that I had a face-to-face encounter that meets the physician face-to-face encounter requirements with this patient on this date. The encounter with the patient was in whole or in part for the following MEDICAL CONDITION: (primary reason for [place] MEDICAL NECESSITY: I  certify, that based on my findings, NURSING services are a medically necessary home health service. HOME BOUND STATUS: I certify that my clinical findings support that this patient is homebound (i.e., Due to illness or injury, pt requires aid of supportive devices such as crutches, cane, wheelchairs, walkers, the use of special transportation or the assistance of another person to leave their place of residence. There is a normal inability to leave the home and doing so requires considerable and taxing effort. Other absences are for medical reasons / religious services and are infrequent or of short duration when for other reasons). o If current dressing causes regression in wound condition, may D/C ordered dressing product/s and apply Normal Saline Moist Dressing daily until next [place]/ Other MD appointment. [place]of regression in wound condition at [phone] o Please direct any NON-WOUND related issues/requests for orders to patient's Primary Care Physician Wound #8 Right,Proximal,Medial,Anterior Lower Leg o CVerdenNurse may visit PRN to address patientos wound care needs. HELIZAH, MIERZWA([id number] o FACE TO FACE ENCOUNTER: MEDICARE and MEDICAID PATIENTS: I certify that this patient is under my care and that  I had a face-to-face encounter that meets the physician face-to-face encounter requirements with this patient on this date. The encounter with the patient was in whole or in part for the following MEDICAL CONDITION: (primary reason for [place]) MEDICAL NECESSITY: I certify, that based on my findings, NURSING services are a medically necessary home health service. HOME BOUND STATUS: I certify that my clinical findings support that this patient is homebound (i.e., Due to illness or injury, pt requires aid of supportive devices such as crutches, cane, wheelchairs, walkers, the use of  special transportation or the assistance of another person to leave their place of residence. There is a normal inability to leave the home and doing so requires considerable and taxing effort. Other absences are for medical reasons / religious services and are infrequent or of short duration when for other reasons). o If current dressing causes regression in wound condition, may D/C ordered dressing product/s and apply Normal Saline Moist Dressing daily until next [place] / Other MD appointment. Register of regression in wound condition at [phone]. o Please direct any NON-WOUND related issues/requests for orders to patient's Primary Care Physician Electronic Signature(s) Signed: 07/27/2019 3:56:00 PM By: Worthy Keeler PA-C Signed: 07/27/2019 3:58:18 PM By: Montey Hora Entered By: Montey Hora on 07/27/2019 14:09:08 [name] [name] ([id number]) -------------------------------------------------------------------------------- Problem List Details Patient Name: [name] [name] Date of Service: 07/27/2019 2:45 PM Medical Record Number: [id number] Patient Account Number: [id number] Date of Birth/Sex: 10/20/1960 (59 y.o. M) Treating RN: Montey Hora Primary Care Provider: Laverna Peace Other Clinician: Referring Provider: Laverna Peace Treating Provider/Extender: Melburn Hake, Winford Hehn Weeks in Treatment: 12 Active Problems ICD-10 Encounter Code Description Active Date MDM Diagnosis I87.2 Venous insufficiency (chronic) (peripheral) 05/04/2019 No Yes I89.0 Lymphedema, not elsewhere classified 05/04/2019 No Yes L97.812 Non-pressure chronic ulcer of other part of right lower leg with fat layer 05/04/2019 No Yes exposed L97.822 Non-pressure chronic ulcer of other part of left lower leg with fat layer 05/04/2019 No Yes exposed [place] (primary) hypertension 05/04/2019 No Yes J44.9 Chronic obstructive pulmonary disease, unspecified 05/04/2019  No Yes Z79.01 Long term (current) use of anticoagulants 05/04/2019 No Yes I48.0 Paroxysmal atrial fibrillation 05/04/2019 No Yes Z86.711 Personal history of pulmonary embolism 05/04/2019 No Yes I50.42 Chronic combined systolic (congestive) and diastolic (congestive) heart 05/04/2019 No Yes failure Inactive Problems Resolved Problems Electronic Signature(s) Signed: 07/27/2019 2:05:01 PM By: Vertis Kelch, Chrissie Noa ([id number]) Entered By: Worthy Keeler on 07/27/2019 14:05:01 [name] [name] ([id number]) -------------------------------------------------------------------------------- Progress Note Details Patient Name: [name] [name] Date of Service: 07/27/2019 2:45 PM Medical Record Number: [id number] Patient Account Number: [id number] Date of Birth/Sex: Jan 07, 1961 (59 y.o. M) Treating RN: Montey Hora Primary Care Provider: Laverna Peace Other Clinician: Referring Provider: Laverna Peace Treating Provider/Extender: Melburn Hake, Lisa-Marie Rueger Weeks in Treatment: 12 Subjective Chief Complaint Information obtained from Patient Bilateral LE ulcers History of Present Illness (HPI) 01/18/2019 on evaluation today patient presents for initial evaluation here in our clinic concerning issues he is been having with his bilateral lower extremities. He appears based on what I am seeing to have bilateral lower extremity cellulitis along with chronic venous stasis. He has significant pitting edema even in the bottom of his foot. With that being said he also has a history of hypertension, COPD, congestive heart failure, long-term use of anticoagulants due to atrial fibrillation which is paroxysmal as well as having had a pulmonary embolism in the past. With that being said he tells me that he has constant drainage  out of his left lower extremity. This is likely due to the fact that again he has significant swelling pretty much all the time. With that being said in the past 2 weeks this has  been getting worse and he tells me at this point that he feels like it is infected is also hurting a lot more than it has been in the past and the drainage is a different color. The left side is definitely worse than the right. Fortunately there is no signs of active infection systemically although I am concerned due to the severity of the infection that I see in the left lower extremity especially. There is a lot of necrotic tissue on the surface of both wounds that at some point is going to need to be cleaned off to allow these to heal but right now obviously this is much too tender for him. No fevers, chills, nausea, vomiting, or diarrhea. 02/02/2019 on evaluation today patient presents for follow-up concerning the wounds over his bilateral lower extremities. He is continuing to manage. He was in the hospital and has been discharged at this point. He was admitted from 01/21/2019 through 01/27/2019. This was due to cellulitis of lower extremity. Nonetheless he does seem to be doing much better at this point. I did review his discharge summary as well he did not appear to have any positive findings on blood cultures. His white blood cell count was never really elevated either which is also good news. He was treated with IV clindamycin for 4 days and then switch to complete a 10-day course at home which she is still working on at this point. They did have him on IV Lasix in the hospital which helped him diurese well on discharge they stated that his weight was 408 pounds which is still higher than his dry weight of 375 to 380 pounds. Upon discharge his torsemide was increased from 40 to 60 mg twice daily and he is to follow-up with cardiology as well. 12/29-Patient returns at 2 weeks for evaluation and follow-up of his bilateral lower extremity wounds, with lymphedema, venous ulcerations, patient was recently hospitalized and was aggressively diuresed after which his torsemide has been increased. He  follows up with cardiology as well. His left leg stasis wounds are more moist and have maceration of tissue, the right one is less so. We are using silver cell and unna compression given the degree of edema in the legs. Readmission: 05/04/2019 patient presents today for reevaluation here in the clinic I have not seen him since December 2020. Subsequently this is a fairly sick individual with multiple comorbidities who presents for follow-up concerning wounds of his bilateral lower extremities. He tells me that after he was in the hospital following when we saw him last in December that he ended up subsequently being discharged with home health services and they have been coming out and applying Unna boot wraps. With that being said he has used up all of his home health visits for the year and then for the last week has not had anything on his legs as far as the wrap is concerned. Obviously this is not good and he does come in today with increased swelling or at least what I perceived to be along with somewhat dry wounds again he does not seem to be weeping too much at this point which is at least good news but he has significant lymphedema/venous stasis. No fevers, chills, nausea, vomiting, or diarrhea. 06/01/2019 upon evaluation today patient  is seen after not having been seen actually since 19 2021. He kept coming for appointment with every time he would come his temperature would be elevated past the point where we could see him. I told him that he needed to go be seen at the ER during 1 of these times due to the fact that obviously this was a fever of unknown origin at that point and my concern was that it could potentially be his legs but again I was not entirely sure as obviously I could not see them due to the restrictions secondary to Covid even bringing the patient into the clinic. Nonetheless he finally came in today his temperature again was elevated but not to the same degree that it met the  100.4 or above guidelines of having to cancel the appointment. Either way we did have a look at his legs unfortunately he appears to show signs of cellulitis at this point which is probably where the temperature elevations are emanating from. He has significant lymphedema as well as chronic vent venous stasis as well. When I initially told him that is why I told him to go to the ER previously when his temperature was elevated in case it was something with wounds or otherwise he replied "nobody ever told me to go to the ER". I then subsequently reminded him of me coming to the front desk due to his temperature being elevated and advising him to go to the ER to which she replied "I am not going to the ER and I do not think I need to even with this". Nonetheless I am concerned about the infection of his legs at this point he has significant weeping, significant edema, and overall is not doing very well in my opinion. 4/23; the patient arrives in clinic today having napkin stuck on his wounds. He is taking the antibiotic from last week. Last Wednesday he had his INR checked at the Coumadin clinic it was apparently "sky high" and his Coumadin is now on hold. He is going back to check this again on Monday. His legs were not put back in compression last week out of fear of the extending infection with fever. He has been taking the antibiotics. Culture from last week showed Enterobacter and methicillin resistant staph aureus. He is on on doxycycline which the MRSA was sensitive to Enterobacter was not plated but given the pattern of sensitivities probably the doxycycline was satisfactory. I will extend the doxycycline another 4 days until he is seen next week. From an infection point of view this looks better 06/14/2019 upon evaluation today patient unfortunately continues to have signs of cellulitis on the lower extremities bilaterally especially on the left. Unfortunately he doesn't seem to be doing much  better despite being on doxycycline for a couple of weeks. He was positive for MRSA as well as Enterobacter. Obviously the MRSA is treated by way of the doxycycline the Enterobacter unfortunately is not I'm going to see about initiating Bactrim for him at this point. 5/13; since he was last here he was hospitalized from 5/7 through 5/11. Mostly this was for congestive heart failure. He has an ejection fraction of Valli, Hillel ([id number]) 25 to 30%. His torsemide dose was increased. While he was there they dealt with the infection in the right leg. He did not take his Bactrim that he was prescribed last time. He was treated with linezolid in the hospital and cefepime and discharged on Bactrim to complete a 2-week course. He  was supposed to follow-up with the Coumadin clinic at his cardiologist's office [Dr. End). He has not done this. He tells me he is on Coumadino 5 mg twice a day. The last INR I see is from 5/11 when he was discharged from the hospital it was 2.1 however it was increasing every day they check this. He was thought to have cellulitis from methicillin-resistant staph aureus. 5/21; patient's wound on the right lower leg actually looks a lot better although not much change in size. We used Iodoflex under an Haematologist last week. The [employer] stayed in place. 07/20/2019 upon evaluation today patient actually appears to be doing much better in regard to his legs in general. He does not have the infection that was previously noted which is great news he was in the hospital which he finally did go after things got significantly worse. Fortunately he seems to be doing much better in regard to his overall appearance today in that regard. With that being said he still is experiencing issues currently with wounds over the lower extremities and his wraps do tend to slip down. We probably do need to use the Unna paste which I think we are doing here but I am not sure the home health nurses are  using right now we may need to send this order along as well. 07/27/2019 upon evaluation today patient appears to be doing excellent in regard to his lower extremities all things considered. He still has significant edema noted but the compression wraps do seem to be helping and overall after getting his infection under good control I feel like things have improved dramatically. There does not appear to be any signs of active infection at this time. No fevers, chills, nausea, vomiting, or diarrhea. Objective Constitutional Well-nourished and well-hydrated in no acute distress. Vitals Time Taken: 1:50 PM, Height: 68 in, Weight: 400 lbs, BMI: 60.8, Temperature: 99.3 F, Pulse: 88 bpm, Respiratory Rate: 20 breaths/min, Blood Pressure: 151/89 mmHg. Respiratory normal breathing without difficulty. Psychiatric this patient is able to make decisions and demonstrates good insight into disease process. Alert and Oriented x 3. pleasant and cooperative. General Notes: Upon inspection patient's wound bed showed signs of good granulation at this point fortunately there is no evidence of active infection which is great news. Overall I am very pleased with where things stand. Integumentary (Hair, Skin) Wound #5 status is Open. Original cause of wound was Gradually Appeared. The wound is located on the Left,Lateral Lower Leg. The wound measures 7.5cm length x 9.5cm width x 0.3cm depth; 55.96cm^2 area and 16.788cm^3 volume. There is Fat Layer (Subcutaneous Tissue) Exposed exposed. There is a large amount of serosanguineous drainage noted. The wound margin is flat and intact. There is small (1-33%) pink granulation within the wound bed. There is a large (67-100%) amount of necrotic tissue within the wound bed including Adherent Slough. Wound #7 status is Open. Original cause of wound was Gradually Appeared. The wound is located on the Right,Anterior Lower Leg. The wound measures 2.5cm length x 8cm width x 0.2cm  depth; 15.708cm^2 area and 3.142cm^3 volume. There is Fat Layer (Subcutaneous Tissue) Exposed exposed. There is a medium amount of serosanguineous drainage noted. The wound margin is flat and intact. There is small (1-33%) pink granulation within the wound bed. There is a large (67-100%) amount of necrotic tissue within the wound bed including Adherent Slough. Wound #8 status is Open. Original cause of wound was Gradually Appeared. The wound is located on the Right,Proximal,Medial,Anterior  Lower Leg. The wound measures 0cm length x 0cm width x 0cm depth; 0cm^2 area and 0cm^3 volume. There is a large amount of serous drainage noted. The wound margin is flat and intact. There is medium (34-66%) granulation within the wound bed. There is a medium (34-66%) amount of necrotic tissue within the wound bed including Adherent Slough. Assessment Active Problems ICD-10 Venous insufficiency (chronic) (peripheral) Lymphedema, not elsewhere classified Non-pressure chronic ulcer of other part of right lower leg with fat layer exposed Non-pressure chronic ulcer of other part of left lower leg with fat layer exposed Essential (primary) hypertension Chronic obstructive pulmonary disease, unspecified Long term (current) use of anticoagulants Acord, Chrissie Noa ([id number]) Paroxysmal atrial fibrillation Personal history of pulmonary embolism Chronic combined systolic (congestive) and diastolic (congestive) heart failure Procedures Wound #5 Pre-procedure diagnosis of Wound #5 is a Lymphedema located on the Left,Lateral Lower Leg . There was a Four Layer Compression Therapy Procedure by Montey Hora, RN. Post procedure Diagnosis Wound #5: Same as Pre-Procedure Wound #7 Pre-procedure diagnosis of Wound #7 is a Venous Leg Ulcer located on the Right,Anterior Lower Leg . There was a Four Layer Compression Therapy Procedure by Montey Hora, RN. Post procedure Diagnosis Wound #7: Same as Pre-Procedure Wound  #8 Pre-procedure diagnosis of Wound #8 is a Diabetic Wound/Ulcer of the Lower Extremity located on the Right,Proximal,Medial,Anterior Lower Leg . There was a Four Layer Compression Therapy Procedure by Montey Hora, RN. Post procedure Diagnosis Wound #8: Same as Pre-Procedure Plan Wound Cleansing: Wound #5 Left,Lateral Lower Leg: Cleanse wound with mild soap and water May shower with protection. Wound #7 Right,Anterior Lower Leg: Cleanse wound with mild soap and water May shower with protection. Wound #8 Right,Proximal,Medial,Anterior Lower Leg: Cleanse wound with mild soap and water May shower with protection. Primary Wound Dressing: Wound #5 Left,Lateral Lower Leg: Hydrafera Blue Ready Transfer Wound #7 Right,Anterior Lower Leg: Hydrafera Blue Ready Transfer Wound #8 Right,Proximal,Medial,Anterior Lower Leg: Hydrafera Blue Ready Transfer Secondary Dressing: Wound #5 Left,Lateral Lower Leg: ABD pad - please pad any areas that may rub (especially around the ankles) XtraSorb - where needed Wound #7 Right,Anterior Lower Leg: ABD pad - please pad any areas that may rub (especially around the ankles) XtraSorb - where needed Wound #8 Right,Proximal,Medial,Anterior Lower Leg: ABD pad - please pad any areas that may rub (especially around the ankles) XtraSorb - where needed Dressing Change Frequency: Wound #5 Left,Lateral Lower Leg: Other: - twice weekly Wound #7 Right,Anterior Lower Leg: Other: - twice weekly Wound #8 Right,Proximal,Medial,Anterior Lower Leg: Other: - twice weekly Follow-up Appointments: Wound #5 Left,Lateral Lower Leg: Return Appointment in 1 week. Nurse Visit as needed Wound #7 Right,Anterior Lower Leg: Return Appointment in 1 week. Nurse Visit as needed Wound #8 Right,Proximal,Medial,Anterior Lower Leg: ANDER, WAMSER ([id number]) Return Appointment in 1 week. Nurse Visit as needed Edema Control: Wound #5 Left,Lateral Lower Leg: 4 Layer Compression  System - Bilateral - please anchor with unna - 4 layer wraps are to provide 30-65mHg compression, please wrap tight enough to accomplish this Elevate legs to the level of the heart and pump ankles as often as possible Wound #7 Right,Anterior Lower Leg: 4 Layer Compression System - Bilateral - please anchor with unna - 4 layer wraps are to provide 30-455mg compression, please wrap tight enough to accomplish this Elevate legs to the level of the heart and pump ankles as often as possible Wound #8 Right,Proximal,Medial,Anterior Lower Leg: 4 Layer Compression System - Bilateral - please anchor with unna - 4 layer wraps are  to provide 30-49mHg compression, please wrap tight enough to accomplish this Elevate legs to the level of the heart and pump ankles as often as possible Home Health: Wound #5 Left,Lateral Lower Leg: CKielNurse may visit PRN to address patient s wound care needs. FACE TO FACE ENCOUNTER: MEDICARE and MEDICAID PATIENTS: I certify that this patient is under my care and that I had a face-to-face encounter that meets the physician face-to-face encounter requirements with this patient on this date. The encounter with the patient was in whole or in part for the following MEDICAL CONDITION: (primary reason for [place] MEDICAL NECESSITY: I certify, that based on my findings, NURSING services are a medically necessary home health service. HOME BOUND STATUS: I certify that my clinical findings support that this patient is homebound (i.e., Due to illness or injury, pt requires aid of supportive devices such as crutches, cane, wheelchairs, walkers, the use of special transportation or the assistance of another person to leave their place of residence. There is a normal inability to leave the home and doing so requires considerable and taxing effort. Other absences are for medical reasons / religious services and are infrequent or of short duration  when for other reasons). If current dressing causes regression in wound condition, may D/C ordered dressing product/s and apply Normal Saline Moist Dressing daily until next [place]/ Other MD appointment. [place]of regression in wound condition at [phone] Please direct any NON-WOUND related issues/requests for orders to patient's Primary Care Physician Wound #7 Right,Anterior Lower Leg: CBarronettNurse may visit PRN to address patient s wound care needs. FACE TO FACE ENCOUNTER: MEDICARE and MEDICAID PATIENTS: I certify that this patient is under my care and that I had a face-to-face encounter that meets the physician face-to-face encounter requirements with this patient on this date. The encounter with the patient was in whole or in part for the following MEDICAL CONDITION: (primary reason for [place] MEDICAL NECESSITY: I certify, that based on my findings, NURSING services are a medically necessary home health service. HOME BOUND STATUS: I certify that my clinical findings support that this patient is homebound (i.e., Due to illness or injury, pt requires aid of supportive devices such as crutches, cane, wheelchairs, walkers, the use of special transportation or the assistance of another person to leave their place of residence. There is a normal inability to leave the home and doing so requires considerable and taxing effort. Other absences are for medical reasons / religious services and are infrequent or of short duration when for other reasons). If current dressing causes regression in wound condition, may D/C ordered dressing product/s and apply Normal Saline Moist Dressing daily until next [place]/ Other MD appointment. NJ[place]of regression in wound condition at [phone] Please direct any NON-WOUND related issues/requests for orders to patient's Primary Care Physician Wound  #8 Right,Proximal,Medial,Anterior Lower Leg: CCapon BridgeNurse may visit PRN to address patient s wound care needs. FACE TO FACE ENCOUNTER: MEDICARE and MEDICAID PATIENTS: I certify that this patient is under my care and that I had a face-to-face encounter that meets the physician face-to-face encounter requirements with this patient on this date. The encounter with the patient was in whole or in part for the following MEDICAL CONDITION: (primary reason for [place] MEDICAL NECESSITY: I certify, that based on my findings, NURSING services are a medically  necessary home health service. HOME BOUND STATUS: I certify that my clinical findings support that this patient is homebound (i.e., Due to illness or injury, pt requires aid of supportive devices such as crutches, cane, wheelchairs, walkers, the use of special transportation or the assistance of another person to leave their place of residence. There is a normal inability to leave the home and doing so requires considerable and taxing effort. Other absences are for medical reasons / religious services and are infrequent or of short duration when for other reasons). If current dressing causes regression in wound condition, may D/C ordered dressing product/s and apply Normal Saline Moist Dressing daily until next [place] / Other MD appointment. [place] of regression in wound condition at [phone]. Please direct any NON-WOUND related issues/requests for orders to patient's Primary Care Physician 1. I would recommend currently that we go ahead and continue with the wound care measures as before utilizing the [facility] dressing which does seem to be beneficial for the patient. 2. We will also get a continue with a 4-layer compression wraps bilaterally. We are going to pad some in regard to the ankle region to try to prevent this from cutting into the patient's legs. He had some  issues with this over the last week. 3. I would also recommend the patient continue to try to elevate his legs is much as possible. We will see patient back for reevaluation in 1 week here in the clinic. If anything worsens or changes patient will contact our office for additional recommendations. Electronic Signature(s) Signed: 07/27/2019 3:08:06 PM By: Worthy Keeler PA-C Entered By: Worthy Keeler on 07/27/2019 15:08:05 [name] [name] ([id number]) -------------------------------------------------------------------------------- SuperBill Details Patient Name: [name] [name] Date of Service: 07/27/2019 Medical Record Number: [id number] Patient Account Number: [id number] Date of Birth/Sex: 1961-01-04 (59 y.o. M) Treating RN: Montey Hora Primary Care Provider: Laverna Peace Other Clinician: Referring Provider: Laverna Peace Treating Provider/Extender: Melburn Hake, Skylie Hiott Weeks in Treatment: 12 Diagnosis Coding ICD-10 Codes Code Description I87.2 Venous insufficiency (chronic) (peripheral) I89.0 Lymphedema, not elsewhere classified L97.812 Non-pressure chronic ulcer of other part of right lower leg with fat layer exposed L97.822 Non-pressure chronic ulcer of other part of left lower leg with fat layer exposed Irena (primary) hypertension J44.9 Chronic obstructive pulmonary disease, unspecified Z79.01 Long term (current) use of anticoagulants I48.0 Paroxysmal atrial fibrillation Z86.711 Personal history of pulmonary embolism I50.42 Chronic combined systolic (congestive) and diastolic (congestive) heart failure Facility Procedures CPT4: Description Modifier Quantity Code [id number] BILATERAL: Application of multi-layer venous compression system; leg (below knee), including 1 ankle and foot. Physician Procedures CPT4 Code: [id number] Description: 78588 - WC PHYS LEVEL 3 - EST PT Modifier: Quantity: 1 CPT4 Code: Description: ICD-10 Diagnosis Description I87.2  Venous insufficiency (chronic) (peripheral) I89.0 Lymphedema, not elsewhere classified F02.774 Non-pressure chronic ulcer of other part of right lower leg with fat la L97.822 Non-pressure chronic ulcer of  other part of left lower leg with fat lay Modifier: yer exposed er exposed Quantity: Electronic Signature(s) Signed: 07/27/2019 3:08:20 PM By: Worthy Keeler PA-C Entered By: Worthy Keeler on 07/27/2019 15:08:19

## 2019-07-27 NOTE — Progress Notes (Signed)
LA, DIBELLA ([id number]) Visit Report for 07/27/2019 Arrival Information Details Patient Name: [name] [name], [name] [name] Date of Service: 07/27/2019 2:45 PM Medical Record Number: [id number] Patient Account Number: [id number] Date of Birth/Sex: [birth year] (59 y.o. M) Treating RN: Curtis Sites Primary Care Karna Abed: Marvell Fuller Other Clinician: Referring Beatryce Colombo: Marvell Fuller Treating Rondia Higginbotham/Extender: Linwood Dibbles, HOYT Weeks in Treatment: 12 Visit Information History Since Last Visit Added or deleted any medications: No Patient Arrived: Wheel Chair Any new allergies or adverse reactions: No Arrival Time: 13:51 Had a fall or experienced change in No Accompanied By: self activities of daily living that may affect Transfer Assistance: None risk of falls: Patient Identification Verified: Yes Signs or symptoms of abuse/neglect since last visito No Secondary Verification Process Completed: Yes Hospitalized since last visit: No Patient Has Alerts: Yes Implantable device outside of the clinic excluding No Patient Alerts: Patient on Blood Thinner cellular tissue based products placed in the center Warfarin since last visit: Has Dressing in Place as Prescribed: No Has Compression in Place as Prescribed: Yes Pain Present Now: No Electronic Signature(s) Signed: 07/27/2019 2:23:08 PM By: Dayton Martes RCP, RRT, CHT Entered By: Dayton Martes on 07/27/2019 13:51:41 [name] [name] ([id number]) -------------------------------------------------------------------------------- Compression Therapy Details Patient Name: [name] [name] Date of Service: 07/27/2019 2:45 PM Medical Record Number: [id number] Patient Account Number: [id number] Date of Birth/Sex: 10/07/1960 (58 y.o. M) Treating RN: Curtis Sites Primary Care Chara Marquard: Marvell Fuller Other Clinician: Referring Izeah Vossler: Marvell Fuller Treating Aristotelis Vilardi/Extender: STONE III, HOYT Weeks in  Treatment: 12 Compression Therapy Performed for Wound Assessment: Wound #5 Left,Lateral Lower Leg Performed By: Clinician Curtis Sites, RN Compression Type: Four Layer Post Procedure Diagnosis Same as Pre-procedure Electronic Signature(s) Signed: 07/27/2019 3:58:18 PM By: Curtis Sites Entered By: Curtis Sites on 07/27/2019 14:10:17 [name] [name] ([id number]) -------------------------------------------------------------------------------- Compression Therapy Details Patient Name: [name] [name] Date of Service: 07/27/2019 2:45 PM Medical Record Number: [id number] Patient Account Number: [id number] Date of Birth/Sex: Jan 12, 1961 (59 y.o. M) Treating RN: Curtis Sites Primary Care Rubin Dais: Marvell Fuller Other Clinician: Referring Anuoluwapo Mefferd: Marvell Fuller Treating Anis Cinelli/Extender: Linwood Dibbles, HOYT Weeks in Treatment: 12 Compression Therapy Performed for Wound Assessment: Wound #7 Right,Anterior Lower Leg Performed By: Clinician Curtis Sites, RN Compression Type: Four Layer Post Procedure Diagnosis Same as Pre-procedure Electronic Signature(s) Signed: 07/27/2019 3:58:18 PM By: Curtis Sites Entered By: Curtis Sites on 07/27/2019 14:10:17 [name] [name] ([id number]) -------------------------------------------------------------------------------- Compression Therapy Details Patient Name: [name] [name] Date of Service: 07/27/2019 2:45 PM Medical Record Number: [id number] Patient Account Number: [id number] Date of Birth/Sex: 1960/08/04 (59 y.o. M) Treating RN: Curtis Sites Primary Care Princessa Lesmeister: Marvell Fuller Other Clinician: Referring Rohnan Bartleson: Marvell Fuller Treating Jaxx Huish/Extender: Linwood Dibbles, HOYT Weeks in Treatment: 12 Compression Therapy Performed for Wound Assessment: Wound #8 Right,Proximal,Medial,Anterior Lower Leg Performed By: Clinician Curtis Sites, RN Compression Type: Four Layer Post Procedure Diagnosis Same as  Pre-procedure Electronic Signature(s) Signed: 07/27/2019 3:58:18 PM By: Curtis Sites Entered By: Curtis Sites on 07/27/2019 14:10:18 [name] [name] ([id number]) -------------------------------------------------------------------------------- Encounter Discharge Information Details Patient Name: [name] [name] Date of Service: 07/27/2019 2:45 PM Medical Record Number: [id number] Patient Account Number: [id number] Date of Birth/Sex: Mar 28, 1960 (59 y.o. M) Treating RN: Curtis Sites Primary Care Ethlyn Alto: Marvell Fuller Other Clinician: Referring Reylynn Vanalstine: Marvell Fuller Treating Daveigh Batty/Extender: Linwood Dibbles, HOYT Weeks in Treatment: 12 Encounter Discharge Information Items Discharge Condition: Stable Ambulatory Status: Wheelchair Discharge Destination: Home Transportation: Private Auto Accompanied By: self Schedule Follow-up Appointment: Yes Clinical Summary of Care: Electronic Signature(s) Signed: 07/27/2019 3:58:18 PM By: Curtis Sites Entered By: Curtis Sites on 07/27/2019 14:11:28  RANELL, SKIBINSKI ([id number]) -------------------------------------------------------------------------------- Lower Extremity Assessment Details Patient Name: [name] [name] Date of Service: 07/27/2019 2:45 PM Medical Record Number: [id number] Patient Account Number: [id number] Date of Birth/Sex: 24-Sep-1960 (59 y.o. M) Treating RN: Army Melia Primary Care Billie Trager: Laverna Peace Other Clinician: Referring Sulay Brymer: Laverna Peace Treating Cinch Ormond/Extender: STONE III, HOYT Weeks in Treatment: 12 Edema Assessment Assessed: [Left: No] [Right: No] Edema: [Left: Yes] [Right: Yes] Calf Left: Right: Point of Measurement: 31 cm From Medial Instep 56 cm 65 cm Ankle Left: Right: Point of Measurement: 13 cm From Medial Instep 34 cm 33 cm Vascular Assessment Pulses: Dorsalis Pedis Palpable: [Left:Yes] [Right:Yes] Electronic Signature(s) Signed: 07/27/2019 3:19:07 PM By:  Army Melia Entered By: Army Melia on 07/27/2019 14:00:17 [name] [name] ([id number]) -------------------------------------------------------------------------------- Multi Wound Chart Details Patient Name: [name] [name] Date of Service: 07/27/2019 2:45 PM Medical Record Number: [id number] Patient Account Number: [id number] Date of Birth/Sex: 1960-06-10 (59 y.o. 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 Comorbid History: Asthma, Chronic Obstructive Asthma, Chronic Obstructive Asthma, Chronic Obstructive Pulmonary Disease (COPD), Sleep Pulmonary Disease (COPD), Sleep Pulmonary Disease (COPD), Sleep Apnea, Arrhythmia, Congestive Apnea, Arrhythmia, Congestive Apnea, Arrhythmia, Congestive Heart Failure, Hypertension, Heart Failure, Hypertension, Heart Failure, Hypertension, Myocardial Infarction, Type II Myocardial Infarction, Type II Myocardial Infarction, Type II Diabetes Diabetes Diabetes Date Acquired: 02/12/2019 05/17/2019 07/16/2019 Weeks of Treatment: 12 7 1  Wound Status: Open Open Open Clustered Wound: Yes No No Clustered Quantity: 2 N/A N/A Measurements L x W x D (cm) 7.5x9.5x0.3 2.5x8x0.2 0x0x0 Area (cm) : 55.96 15.708 0 Volume (cm) : 16.788 3.142 0 % Reduction in Area: -182.70% 79.60% 100.00% % Reduction in Volume: -748.30% 79.60% 100.00% Classification: Full Thickness  Without Exposed Full Thickness Without Exposed Grade 2 Support Structures Support Structures Exudate Amount: Large Medium Large Exudate Type: Serosanguineous Serosanguineous Serous Exudate Color: red, brown red, brown amber Wound Margin: Flat and Intact Flat and Intact Flat and Intact Granulation Amount: Small (1-33%) Small (1-33%) Medium (34-66%) Granulation Quality: Pink Pink N/A Necrotic Amount: Large (67-100%) Large (67-100%) Medium (34-66%) Exposed Structures: Fat Layer (Subcutaneous Tissue) Fat Layer (Subcutaneous Tissue) Fascia: No Exposed: Yes Exposed: Yes Fat Layer (Subcutaneous Tissue) Fascia: No Fascia: No Exposed: No Tendon: No Tendon: No Tendon: No Muscle: No Muscle: No Muscle: No Joint: No Joint: No Joint: No Bone: No Bone: No Bone: No Epithelialization: None None None Treatment Notes LADARRYL, WRAGE ([id number]) Electronic Signature(s) Signed: 07/27/2019 3:58:18 PM By: Montey Hora Entered By: Montey Hora on 07/27/2019 14:06:52 [name] [name] ([id number]) -------------------------------------------------------------------------------- Multi-Disciplinary Care Plan Details Patient Name: [name] [name] Date of Service: 07/27/2019 2:45 PM Medical Record Number: [id number] Patient Account Number: [id number] Date of Birth/Sex: Dec 24, 1960 (59 y.o. M) Treating RN: Montey Hora Primary Care Krystofer Hevener: Laverna Peace Other Clinician: Referring Jarissa Sheriff: Laverna Peace Treating Keyon Winnick/Extender: Melburn Hake, HOYT Weeks in Treatment: 12 Active Inactive Abuse / Safety / Falls / Self Care Management Nursing Diagnoses: Potential for falls Goals: Patient will remain injury free related to falls Date Initiated: 05/04/2019 Target Resolution Date: 07/21/2019 Goal Status: Active Interventions: Assess fall risk on admission and as needed Provide education on  basic hygiene Notes: Nutrition Nursing Diagnoses: Impaired glucose control: actual or  potential Goals: Patient/caregiver agrees to and verbalizes understanding of need to use nutritional supplements and/or vitamins as prescribed Date Initiated: 05/04/2019 Target Resolution Date: 07/21/2019 Goal Status: Active Interventions: Assess patient nutrition upon admission and as needed per policy Notes: Orientation to the Wound Care Program Nursing Diagnoses: Knowledge deficit related to the wound healing center program Goals: Patient/caregiver will verbalize understanding of the Wound Healing Center Program Date Initiated: 05/04/2019 Target Resolution Date: 07/21/2019 Goal Status: Active Interventions: Provide education on orientation to the wound center Notes: Venous Leg Ulcer Nursing Diagnoses: Potential for venous Insuffiency (use before diagnosis confirmed) Goals: Patient will maintain optimal edema control Date Initiated: 05/04/2019 Target Resolution Date: 07/21/2019 Goal Status: Active ADMIRAL, MARCUCCI ([id number]) Interventions: Compression as ordered Notes: Wound/Skin Impairment Nursing Diagnoses: Impaired tissue integrity Goals: Ulcer/skin breakdown will heal within 14 weeks Date Initiated: 05/04/2019 Target Resolution Date: 07/21/2019 Goal Status: Active Interventions: Assess patient/caregiver ability to obtain necessary supplies Assess patient/caregiver ability to perform ulcer/skin care regimen upon admission and as needed Assess ulceration(s) every visit Notes: Electronic Signature(s) Signed: 07/27/2019 3:58:18 PM By: Curtis Sites Entered By: Curtis Sites on 07/27/2019 14:06:40 [name] [name] ([id number]) -------------------------------------------------------------------------------- Pain Assessment Details Patient Name: [name] [name] Date of Service: 07/27/2019 2:45 PM Medical Record Number: [id number] Patient Account Number: [id number] Date of Birth/Sex: December 11, 1960 (59 y.o. M) Treating RN: Rodell Perna Primary Care Britanie Harshman: Marvell Fuller  Other Clinician: Referring Labresha Mellor: Marvell Fuller Treating Cinzia Devos/Extender: Linwood Dibbles, HOYT Weeks in Treatment: 12 Active Problems Location of Pain Severity and Description of Pain Patient Has Paino No Site Locations Pain Management and Medication Current Pain Management: Electronic Signature(s) Signed: 07/27/2019 3:19:07 PM By: Rodell Perna Entered By: Rodell Perna on 07/27/2019 13:57:34 [name] [name] ([id number]) -------------------------------------------------------------------------------- Patient/Caregiver Education Details Patient Name: [name] [name] Date of Service: 07/27/2019 2:45 PM Medical Record Number: [id number] Patient Account Number: [id number] Date of Birth/Gender: 24-Jan-1961 (59 y.o. M) Treating RN: Curtis Sites Primary Care Physician: Marvell Fuller Other Clinician: Referring Physician: Marvell Fuller Treating Physician/Extender: Skeet Simmer in Treatment: 12 Education Assessment Education Provided To: Patient Education Topics Provided Venous: Handouts: Other: leg elevation Methods: Explain/Verbal Responses: State content correctly Electronic Signature(s) Signed: 07/27/2019 3:58:18 PM By: Curtis Sites Entered By: Curtis Sites on 07/27/2019 14:10:46 [name] [name] ([id number]) -------------------------------------------------------------------------------- Wound Assessment Details Patient Name: [name] [name] Date of Service: 07/27/2019 2:45 PM Medical Record Number: [id number] Patient Account Number: [id number] Date of Birth/Sex: 07/24/1960 (59 y.o. M) Treating RN: Rodell Perna Primary Care Nickolis Diel: Marvell Fuller Other Clinician: Referring Laurna Shetley: Marvell Fuller Treating Margherita Collyer/Extender: STONE III, HOYT Weeks in Treatment: 12 Wound Status Wound Number: 5 Primary Lymphedema Etiology: Wound Location: Left, Lateral Lower Leg Wound Open Wounding Event: Gradually Appeared Status: Date Acquired:  02/12/2019 Comorbid Asthma, Chronic Obstructive Pulmonary Disease (COPD), Weeks Of Treatment: 12 History: Sleep Apnea, Arrhythmia, Congestive Heart Failure, Clustered Wound: Yes Hypertension, Myocardial Infarction, Type II Diabetes Photos Wound Measurements Length: (cm) 7.5 Width: (cm) 9.5 Depth: (cm) 0.3 Clustered Quantity: 2 Area: (cm) 55.96 Volume: (cm) 16.788 % Reduction in Area: -182.7% % Reduction in Volume: -748.3% Epithelialization: None Wound Description Classification: Full Thickness Without Exposed Support Struct Wound Margin: Flat and Intact Exudate Amount: Large Exudate Type: Serosanguineous Exudate Color: red, brown ures Foul Odor After Cleansing: No Slough/Fibrino Yes Wound Bed Granulation Amount: Small (1-33%) Exposed Structure Granulation Quality: Pink Fascia Exposed: No Necrotic Amount: Large (67-100%) Fat Layer (Subcutaneous Tissue) Exposed: Yes Necrotic Quality: Adherent Slough Tendon Exposed: No Muscle Exposed: No  Joint Exposed: No Bone Exposed: No Treatment Notes Wound #5 (Left, Lateral Lower Leg) Notes hydrofera blue, xsorb, 4 layer bilateral ZHAYNE, LOU ([id number]) Electronic Signature(s) Signed: 07/27/2019 3:19:07 PM By: Rodell Perna Entered By: Rodell Perna on 07/27/2019 13:59:03 [name] [name] ([id number]) -------------------------------------------------------------------------------- Wound Assessment Details Patient Name: [name] [name] Date of Service: 07/27/2019 2:45 PM Medical Record Number: [id number] Patient Account Number: [id number] Date of Birth/Sex: 03/02/1960 (59 y.o. M) Treating RN: Rodell Perna Primary Care Yanely Mast: Marvell Fuller Other Clinician: Referring Maui Britten: Marvell Fuller Treating Suraiya Dickerson/Extender: STONE III, HOYT Weeks in Treatment: 12 Wound Status Wound Number: 7 Primary Venous Leg Ulcer Etiology: Wound Location: Right, Anterior Lower Leg Wound Open Wounding Event: Gradually  Appeared Status: Date Acquired: 05/17/2019 Comorbid Asthma, Chronic Obstructive Pulmonary Disease (COPD), Weeks Of Treatment: 7 History: Sleep Apnea, Arrhythmia, Congestive Heart Failure, Clustered Wound: No Hypertension, Myocardial Infarction, Type II Diabetes Photos Wound Measurements Length: (cm) 2.5 Width: (cm) 8 Depth: (cm) 0.2 Area: (cm) 15.708 Volume: (cm) 3.142 % Reduction in Area: 79.6% % Reduction in Volume: 79.6% Epithelialization: None Wound Description Classification: Full Thickness Without Exposed Support Struct Wound Margin: Flat and Intact Exudate Amount: Medium Exudate Type: Serosanguineous Exudate Color: red, brown ures Foul Odor After Cleansing: No Slough/Fibrino Yes Wound Bed Granulation Amount: Small (1-33%) Exposed Structure Granulation Quality: Pink Fascia Exposed: No Necrotic Amount: Large (67-100%) Fat Layer (Subcutaneous Tissue) Exposed: Yes Necrotic Quality: Adherent Slough Tendon Exposed: No Muscle Exposed: No Joint Exposed: No Bone Exposed: No Treatment Notes Wound #7 (Right, Anterior Lower Leg) Notes hydrofera blue, xsorb, 4 layer bilateral Electronic Signature(sVISHAAL, KLARE ([id number]) Signed: 07/27/2019 3:19:07 PM By: Rodell Perna Entered By: Rodell Perna on 07/27/2019 13:59:34 [name] [name] ([id number]) -------------------------------------------------------------------------------- Wound Assessment Details Patient Name: [name] [name] Date of Service: 07/27/2019 2:45 PM Medical Record Number: [id number] Patient Account Number: [id number] Date of Birth/Sex: 30-Jan-1961 (59 y.o. M) Treating RN: Rodell Perna Primary Care Arly Salminen: Marvell Fuller Other Clinician: Referring Shadia Larose: Marvell Fuller Treating Shellia Hartl/Extender: STONE III, HOYT Weeks in Treatment: 12 Wound Status Wound Number: 8 Primary Diabetic Wound/Ulcer of the Lower Extremity Etiology: Wound Location: Right, Proximal, Medial, Anterior Lower  Leg Wound Open Wounding Event: Gradually Appeared Status: Date Acquired: 07/16/2019 Comorbid Asthma, Chronic Obstructive Pulmonary Disease (COPD), Weeks Of Treatment: 1 History: Sleep Apnea, Arrhythmia, Congestive Heart Failure, Clustered Wound: No Hypertension, Myocardial Infarction, Type II Diabetes Photos Wound Measurements Length: (cm) Width: (cm) Depth: (cm) Area: (cm) Volume: (cm) 0 % Reduction in Area: 100% 0 % Reduction in Volume: 100% 0 Epithelialization: None 0 0 Wound Description Classification: Grade 2 Wound Margin: Flat and Intact Exudate Amount: Large Exudate Type: Serous Exudate Color: amber Foul Odor After Cleansing: No Slough/Fibrino Yes Wound Bed Granulation Amount: Medium (34-66%) Exposed Structure Necrotic Amount: Medium (34-66%) Fascia Exposed: No Necrotic Quality: Adherent Slough Fat Layer (Subcutaneous Tissue) Exposed: No Tendon Exposed: No Muscle Exposed: No Joint Exposed: No Bone Exposed: No Treatment Notes Wound #8 (Right, Proximal, Medial, Anterior Lower Leg) Notes hydrofera blue, xsorb, 4 layer bilateral Electronic Signature(sWAINO, LESSA ([id number]) Signed: 07/27/2019 3:19:07 PM By: Rodell Perna Entered By: Rodell Perna on 07/27/2019 13:59:50 [name] [name] ([id number]) -------------------------------------------------------------------------------- Vitals Details Patient Name: [name] [name] Date of Service: 07/27/2019 2:45 PM Medical Record Number: [id number] Patient Account Number: [id number] Date of Birth/Sex: [birth year] (59 y.o. M) Treating RN: Curtis Sites Primary Care Soriya Worster: Marvell Fuller Other Clinician: Referring Ellean Firman: Marvell Fuller Treating Lj Miyamoto/Extender: STONE III, HOYT Weeks in Treatment: 12 Vital Signs Time Taken: 13:50 Temperature (F): 99.3 Height (in): 68 Pulse (bpm): 88 Weight (  lbs): 400 Respiratory Rate (breaths/min): 20 Body Mass Index (BMI): 60.8 Blood Pressure (mmHg):  151/89 Reference Range: 80 - 120 mg / dl Electronic Signature(s) Signed: 07/27/2019 2:23:08 PM By: Dayton Martes RCP, RRT, CHT Entered By: Dayton Martes on 07/27/2019 13:53:15

## 2019-07-30 DIAGNOSIS — E1151 Type 2 diabetes mellitus with diabetic peripheral angiopathy without gangrene: Secondary | ICD-10-CM | POA: Diagnosis not present

## 2019-07-30 DIAGNOSIS — I5022 Chronic systolic (congestive) heart failure: Secondary | ICD-10-CM | POA: Diagnosis not present

## 2019-07-30 DIAGNOSIS — E785 Hyperlipidemia, unspecified: Secondary | ICD-10-CM | POA: Diagnosis not present

## 2019-07-30 DIAGNOSIS — G8194 Hemiplegia, unspecified affecting left nondominant side: Secondary | ICD-10-CM | POA: Diagnosis not present

## 2019-07-30 DIAGNOSIS — M10041 Idiopathic gout, right hand: Secondary | ICD-10-CM | POA: Diagnosis not present

## 2019-07-30 DIAGNOSIS — I872 Venous insufficiency (chronic) (peripheral): Secondary | ICD-10-CM | POA: Diagnosis not present

## 2019-07-30 DIAGNOSIS — F321 Major depressive disorder, single episode, moderate: Secondary | ICD-10-CM | POA: Diagnosis not present

## 2019-07-30 DIAGNOSIS — I110 Hypertensive heart disease with heart failure: Secondary | ICD-10-CM | POA: Diagnosis not present

## 2019-07-30 DIAGNOSIS — M1611 Unilateral primary osteoarthritis, right hip: Secondary | ICD-10-CM | POA: Diagnosis not present

## 2019-07-30 DIAGNOSIS — G4733 Obstructive sleep apnea (adult) (pediatric): Secondary | ICD-10-CM | POA: Diagnosis not present

## 2019-07-30 DIAGNOSIS — I890 Lymphedema, not elsewhere classified: Secondary | ICD-10-CM | POA: Diagnosis not present

## 2019-07-30 DIAGNOSIS — L97821 Non-pressure chronic ulcer of other part of left lower leg limited to breakdown of skin: Secondary | ICD-10-CM | POA: Diagnosis not present

## 2019-07-30 DIAGNOSIS — D649 Anemia, unspecified: Secondary | ICD-10-CM | POA: Diagnosis not present

## 2019-07-30 DIAGNOSIS — I2510 Atherosclerotic heart disease of native coronary artery without angina pectoris: Secondary | ICD-10-CM | POA: Diagnosis not present

## 2019-07-30 DIAGNOSIS — Z7901 Long term (current) use of anticoagulants: Secondary | ICD-10-CM | POA: Diagnosis not present

## 2019-07-30 DIAGNOSIS — M545 Low back pain: Secondary | ICD-10-CM | POA: Diagnosis not present

## 2019-07-30 DIAGNOSIS — Z792 Long term (current) use of antibiotics: Secondary | ICD-10-CM | POA: Diagnosis not present

## 2019-07-30 DIAGNOSIS — E662 Morbid (severe) obesity with alveolar hypoventilation: Secondary | ICD-10-CM | POA: Diagnosis not present

## 2019-07-30 DIAGNOSIS — J449 Chronic obstructive pulmonary disease, unspecified: Secondary | ICD-10-CM | POA: Diagnosis not present

## 2019-07-30 DIAGNOSIS — I4819 Other persistent atrial fibrillation: Secondary | ICD-10-CM | POA: Diagnosis not present

## 2019-07-30 DIAGNOSIS — G510 Bell's palsy: Secondary | ICD-10-CM | POA: Diagnosis not present

## 2019-07-30 DIAGNOSIS — Z6841 Body Mass Index (BMI) 40.0 and over, adult: Secondary | ICD-10-CM | POA: Diagnosis not present

## 2019-07-30 DIAGNOSIS — G894 Chronic pain syndrome: Secondary | ICD-10-CM | POA: Diagnosis not present

## 2019-07-30 DIAGNOSIS — L97811 Non-pressure chronic ulcer of other part of right lower leg limited to breakdown of skin: Secondary | ICD-10-CM | POA: Diagnosis not present

## 2019-07-30 DIAGNOSIS — Z86718 Personal history of other venous thrombosis and embolism: Secondary | ICD-10-CM | POA: Diagnosis not present

## 2019-07-30 NOTE — Telephone Encounter (Addendum)
Im unsure to whom I forward this message to, multiple attempts have been made for patient to be assessed for home health, [facility] case worker has been actively following patients case trying to help find resources. Please see previous phone encounter for reference. Osvaldo Angst and Dr. Beryle Flock are accepting new patients, since this patient is already established in practice I wasn't sure who he would go to next for care in our office. I will forward this message to the physcians to review chart and advise. KW

## 2019-07-30 NOTE — Telephone Encounter (Addendum)
Pt states he still needs an aid to help with his home health.  Pt states he needs aid to help with daily ADL's. Pt would like to know if you can follow up on this.  Pt states he is also supposed to have a Child psychotherapist.  Pt states he was in the hospital when he was scheduled for his GI appt.  Pt states he cannot have hip surgery until he loses weight. Pt asking if you will make him another appt/referral.  Pt also requesting to see another pcp other than than Marcelino Duster. Pt is requesting an appt.  But is declining to schedule with Marcelino Duster.

## 2019-07-30 NOTE — Telephone Encounter (Signed)
Social worker informed provider patient was switching cardiologist from Dr. Okey Dupre to [facility] and would be leaving our primary care to go to [facility]. Unable to reach patient multiple and he refuses to come in to the clinic for appointments and follow up's as recommend. Has also been set letter due to rude comments to staf- discussed with  Vella Kohler, MD, MPH.

## 2019-07-31 ENCOUNTER — Telehealth: Payer: Self-pay

## 2019-07-31 ENCOUNTER — Encounter: Payer: Self-pay | Admitting: Adult Health

## 2019-07-31 NOTE — Telephone Encounter (Signed)
Copied from CRM [id number]. Topic: General - Inquiry >> Jul 31, 2019  2:06 PM Reggie Pile, NT wrote: Reason for CRM: Patient called in stating he would like to speak with someone in regards to having home health, as he stating he has requested this two months ago. Please advise.

## 2019-08-01 ENCOUNTER — Other Ambulatory Visit: Payer: Self-pay | Admitting: Student

## 2019-08-01 DIAGNOSIS — I2510 Atherosclerotic heart disease of native coronary artery without angina pectoris: Secondary | ICD-10-CM | POA: Diagnosis not present

## 2019-08-01 DIAGNOSIS — G8194 Hemiplegia, unspecified affecting left nondominant side: Secondary | ICD-10-CM | POA: Diagnosis not present

## 2019-08-01 DIAGNOSIS — Z792 Long term (current) use of antibiotics: Secondary | ICD-10-CM | POA: Diagnosis not present

## 2019-08-01 DIAGNOSIS — J449 Chronic obstructive pulmonary disease, unspecified: Secondary | ICD-10-CM | POA: Diagnosis not present

## 2019-08-01 DIAGNOSIS — L97811 Non-pressure chronic ulcer of other part of right lower leg limited to breakdown of skin: Secondary | ICD-10-CM | POA: Diagnosis not present

## 2019-08-01 DIAGNOSIS — Z86718 Personal history of other venous thrombosis and embolism: Secondary | ICD-10-CM | POA: Diagnosis not present

## 2019-08-01 DIAGNOSIS — I872 Venous insufficiency (chronic) (peripheral): Secondary | ICD-10-CM | POA: Diagnosis not present

## 2019-08-01 DIAGNOSIS — L97821 Non-pressure chronic ulcer of other part of left lower leg limited to breakdown of skin: Secondary | ICD-10-CM | POA: Diagnosis not present

## 2019-08-01 DIAGNOSIS — E1151 Type 2 diabetes mellitus with diabetic peripheral angiopathy without gangrene: Secondary | ICD-10-CM | POA: Diagnosis not present

## 2019-08-01 DIAGNOSIS — D649 Anemia, unspecified: Secondary | ICD-10-CM | POA: Diagnosis not present

## 2019-08-01 DIAGNOSIS — I890 Lymphedema, not elsewhere classified: Secondary | ICD-10-CM | POA: Diagnosis not present

## 2019-08-01 DIAGNOSIS — M10041 Idiopathic gout, right hand: Secondary | ICD-10-CM | POA: Diagnosis not present

## 2019-08-01 DIAGNOSIS — G894 Chronic pain syndrome: Secondary | ICD-10-CM | POA: Diagnosis not present

## 2019-08-01 DIAGNOSIS — M1611 Unilateral primary osteoarthritis, right hip: Secondary | ICD-10-CM | POA: Diagnosis not present

## 2019-08-01 DIAGNOSIS — I5022 Chronic systolic (congestive) heart failure: Secondary | ICD-10-CM | POA: Diagnosis not present

## 2019-08-01 DIAGNOSIS — M545 Low back pain: Secondary | ICD-10-CM | POA: Diagnosis not present

## 2019-08-01 DIAGNOSIS — G510 Bell's palsy: Secondary | ICD-10-CM | POA: Diagnosis not present

## 2019-08-01 DIAGNOSIS — I4819 Other persistent atrial fibrillation: Secondary | ICD-10-CM | POA: Diagnosis not present

## 2019-08-01 DIAGNOSIS — I110 Hypertensive heart disease with heart failure: Secondary | ICD-10-CM | POA: Diagnosis not present

## 2019-08-01 DIAGNOSIS — F321 Major depressive disorder, single episode, moderate: Secondary | ICD-10-CM | POA: Diagnosis not present

## 2019-08-01 DIAGNOSIS — E785 Hyperlipidemia, unspecified: Secondary | ICD-10-CM | POA: Diagnosis not present

## 2019-08-01 DIAGNOSIS — R079 Chest pain, unspecified: Secondary | ICD-10-CM

## 2019-08-01 DIAGNOSIS — G4733 Obstructive sleep apnea (adult) (pediatric): Secondary | ICD-10-CM | POA: Diagnosis not present

## 2019-08-01 DIAGNOSIS — E662 Morbid (severe) obesity with alveolar hypoventilation: Secondary | ICD-10-CM | POA: Diagnosis not present

## 2019-08-01 DIAGNOSIS — Z7901 Long term (current) use of anticoagulants: Secondary | ICD-10-CM | POA: Diagnosis not present

## 2019-08-01 DIAGNOSIS — Z6841 Body Mass Index (BMI) 40.0 and over, adult: Secondary | ICD-10-CM | POA: Diagnosis not present

## 2019-08-03 ENCOUNTER — Ambulatory Visit: Payer: Medicaid Other | Admitting: Physician Assistant

## 2019-08-06 NOTE — Telephone Encounter (Signed)
As it is less than 30 days from sending letter, need to be available for urgent needs.  If he would like to be seen for any urgent needs, he is welcome to make an appointment.  He needs to go ahead and establish with another PCP office as advised in the dismissal letter.

## 2019-08-06 NOTE — Telephone Encounter (Signed)
Yes that's fine. Thanks [employer].

## 2019-08-06 NOTE — Telephone Encounter (Signed)
Since [name] [name] is out of office this week, okay for me to put in new orders for referral for home health and social worker? KW

## 2019-08-07 NOTE — Telephone Encounter (Signed)
Not currently accepting patients thanks

## 2019-08-08 DIAGNOSIS — I447 Left bundle-branch block, unspecified: Secondary | ICD-10-CM | POA: Diagnosis not present

## 2019-08-09 ENCOUNTER — Inpatient Hospital Stay
Admission: AD | Admit: 2019-08-09 | Discharge: 2019-08-14 | DRG: 871 | Disposition: A | Discharge status: Home Health | Payer: Medicaid Other | Attending: Internal Medicine | Admitting: Internal Medicine

## 2019-08-09 ENCOUNTER — Emergency Department: Payer: Medicaid Other

## 2019-08-09 ENCOUNTER — Inpatient Hospital Stay: Payer: Medicaid Other

## 2019-08-09 ENCOUNTER — Other Ambulatory Visit: Payer: Self-pay

## 2019-08-09 DIAGNOSIS — I482 Chronic atrial fibrillation, unspecified: Secondary | ICD-10-CM

## 2019-08-09 DIAGNOSIS — Z20822 Contact with and (suspected) exposure to covid-19: Secondary | ICD-10-CM | POA: Diagnosis present

## 2019-08-09 DIAGNOSIS — Z9114 Patient's other noncompliance with medication regimen: Secondary | ICD-10-CM

## 2019-08-09 DIAGNOSIS — Z87820 Personal history of traumatic brain injury: Secondary | ICD-10-CM

## 2019-08-09 DIAGNOSIS — G4733 Obstructive sleep apnea (adult) (pediatric): Secondary | ICD-10-CM | POA: Diagnosis present

## 2019-08-09 DIAGNOSIS — R279 Unspecified lack of coordination: Secondary | ICD-10-CM | POA: Diagnosis not present

## 2019-08-09 DIAGNOSIS — J9621 Acute and chronic respiratory failure with hypoxia: Secondary | ICD-10-CM | POA: Diagnosis present

## 2019-08-09 DIAGNOSIS — I459 Conduction disorder, unspecified: Secondary | ICD-10-CM | POA: Diagnosis present

## 2019-08-09 DIAGNOSIS — L97222 Non-pressure chronic ulcer of left calf with fat layer exposed: Secondary | ICD-10-CM | POA: Diagnosis present

## 2019-08-09 DIAGNOSIS — L03116 Cellulitis of left lower limb: Secondary | ICD-10-CM

## 2019-08-09 DIAGNOSIS — E1169 Type 2 diabetes mellitus with other specified complication: Secondary | ICD-10-CM

## 2019-08-09 DIAGNOSIS — I252 Old myocardial infarction: Secondary | ICD-10-CM

## 2019-08-09 DIAGNOSIS — M1990 Unspecified osteoarthritis, unspecified site: Secondary | ICD-10-CM | POA: Diagnosis present

## 2019-08-09 DIAGNOSIS — J432 Centrilobular emphysema: Secondary | ICD-10-CM | POA: Diagnosis present

## 2019-08-09 DIAGNOSIS — R600 Localized edema: Secondary | ICD-10-CM | POA: Diagnosis not present

## 2019-08-09 DIAGNOSIS — Z86711 Personal history of pulmonary embolism: Secondary | ICD-10-CM

## 2019-08-09 DIAGNOSIS — R0789 Other chest pain: Secondary | ICD-10-CM | POA: Diagnosis not present

## 2019-08-09 DIAGNOSIS — J449 Chronic obstructive pulmonary disease, unspecified: Secondary | ICD-10-CM | POA: Diagnosis present

## 2019-08-09 DIAGNOSIS — F10929 Alcohol use, unspecified with intoxication, unspecified: Secondary | ICD-10-CM

## 2019-08-09 DIAGNOSIS — D689 Coagulation defect, unspecified: Secondary | ICD-10-CM | POA: Diagnosis present

## 2019-08-09 DIAGNOSIS — M542 Cervicalgia: Secondary | ICD-10-CM

## 2019-08-09 DIAGNOSIS — R531 Weakness: Secondary | ICD-10-CM | POA: Diagnosis not present

## 2019-08-09 DIAGNOSIS — A419 Sepsis, unspecified organism: Principal | ICD-10-CM | POA: Diagnosis present

## 2019-08-09 DIAGNOSIS — I5033 Acute on chronic diastolic (congestive) heart failure: Secondary | ICD-10-CM | POA: Diagnosis present

## 2019-08-09 DIAGNOSIS — I7090 Unspecified atherosclerosis: Secondary | ICD-10-CM | POA: Diagnosis not present

## 2019-08-09 DIAGNOSIS — E785 Hyperlipidemia, unspecified: Secondary | ICD-10-CM | POA: Diagnosis present

## 2019-08-09 DIAGNOSIS — F10230 Alcohol dependence with withdrawal, uncomplicated: Secondary | ICD-10-CM | POA: Diagnosis present

## 2019-08-09 DIAGNOSIS — F1010 Alcohol abuse, uncomplicated: Secondary | ICD-10-CM

## 2019-08-09 DIAGNOSIS — Y908 Blood alcohol level of 240 mg/100 ml or more: Secondary | ICD-10-CM | POA: Diagnosis present

## 2019-08-09 DIAGNOSIS — I428 Other cardiomyopathies: Secondary | ICD-10-CM | POA: Diagnosis present

## 2019-08-09 DIAGNOSIS — I517 Cardiomegaly: Secondary | ICD-10-CM | POA: Diagnosis not present

## 2019-08-09 DIAGNOSIS — E039 Hypothyroidism, unspecified: Secondary | ICD-10-CM | POA: Diagnosis present

## 2019-08-09 DIAGNOSIS — I21A1 Myocardial infarction type 2: Secondary | ICD-10-CM | POA: Diagnosis present

## 2019-08-09 DIAGNOSIS — S91001A Unspecified open wound, right ankle, initial encounter: Secondary | ICD-10-CM | POA: Diagnosis not present

## 2019-08-09 DIAGNOSIS — Z6841 Body Mass Index (BMI) 40.0 and over, adult: Secondary | ICD-10-CM

## 2019-08-09 DIAGNOSIS — R079 Chest pain, unspecified: Secondary | ICD-10-CM | POA: Diagnosis not present

## 2019-08-09 DIAGNOSIS — I10 Essential (primary) hypertension: Secondary | ICD-10-CM | POA: Diagnosis not present

## 2019-08-09 DIAGNOSIS — L97212 Non-pressure chronic ulcer of right calf with fat layer exposed: Secondary | ICD-10-CM | POA: Diagnosis present

## 2019-08-09 DIAGNOSIS — T148XXA Other injury of unspecified body region, initial encounter: Secondary | ICD-10-CM

## 2019-08-09 DIAGNOSIS — I2510 Atherosclerotic heart disease of native coronary artery without angina pectoris: Secondary | ICD-10-CM | POA: Diagnosis present

## 2019-08-09 DIAGNOSIS — F10220 Alcohol dependence with intoxication, uncomplicated: Secondary | ICD-10-CM | POA: Diagnosis present

## 2019-08-09 DIAGNOSIS — Z743 Need for continuous supervision: Secondary | ICD-10-CM | POA: Diagnosis not present

## 2019-08-09 DIAGNOSIS — Z86718 Personal history of other venous thrombosis and embolism: Secondary | ICD-10-CM

## 2019-08-09 DIAGNOSIS — J9811 Atelectasis: Secondary | ICD-10-CM | POA: Diagnosis not present

## 2019-08-09 DIAGNOSIS — S91002A Unspecified open wound, left ankle, initial encounter: Secondary | ICD-10-CM | POA: Diagnosis not present

## 2019-08-09 DIAGNOSIS — M109 Gout, unspecified: Secondary | ICD-10-CM | POA: Diagnosis present

## 2019-08-09 DIAGNOSIS — R778 Other specified abnormalities of plasma proteins: Secondary | ICD-10-CM

## 2019-08-09 DIAGNOSIS — Z7901 Long term (current) use of anticoagulants: Secondary | ICD-10-CM

## 2019-08-09 DIAGNOSIS — I5043 Acute on chronic combined systolic (congestive) and diastolic (congestive) heart failure: Secondary | ICD-10-CM | POA: Diagnosis present

## 2019-08-09 DIAGNOSIS — I4819 Other persistent atrial fibrillation: Secondary | ICD-10-CM | POA: Diagnosis present

## 2019-08-09 DIAGNOSIS — G894 Chronic pain syndrome: Secondary | ICD-10-CM | POA: Diagnosis present

## 2019-08-09 DIAGNOSIS — L03115 Cellulitis of right lower limb: Secondary | ICD-10-CM | POA: Diagnosis present

## 2019-08-09 DIAGNOSIS — Z79899 Other long term (current) drug therapy: Secondary | ICD-10-CM

## 2019-08-09 DIAGNOSIS — F10930 Alcohol use, unspecified with withdrawal, uncomplicated: Secondary | ICD-10-CM

## 2019-08-09 DIAGNOSIS — R0602 Shortness of breath: Secondary | ICD-10-CM | POA: Diagnosis not present

## 2019-08-09 DIAGNOSIS — Z79890 Hormone replacement therapy (postmenopausal): Secondary | ICD-10-CM

## 2019-08-09 DIAGNOSIS — I890 Lymphedema, not elsewhere classified: Secondary | ICD-10-CM | POA: Diagnosis present

## 2019-08-09 DIAGNOSIS — R069 Unspecified abnormalities of breathing: Secondary | ICD-10-CM | POA: Diagnosis not present

## 2019-08-09 DIAGNOSIS — M19071 Primary osteoarthritis, right ankle and foot: Secondary | ICD-10-CM | POA: Diagnosis not present

## 2019-08-09 DIAGNOSIS — J9622 Acute and chronic respiratory failure with hypercapnia: Secondary | ICD-10-CM

## 2019-08-09 DIAGNOSIS — M19072 Primary osteoarthritis, left ankle and foot: Secondary | ICD-10-CM | POA: Diagnosis not present

## 2019-08-09 DIAGNOSIS — Z8249 Family history of ischemic heart disease and other diseases of the circulatory system: Secondary | ICD-10-CM

## 2019-08-09 DIAGNOSIS — I110 Hypertensive heart disease with heart failure: Secondary | ICD-10-CM | POA: Diagnosis present

## 2019-08-09 DIAGNOSIS — Z791 Long term (current) use of non-steroidal anti-inflammatories (NSAID): Secondary | ICD-10-CM

## 2019-08-09 DIAGNOSIS — L97902 Non-pressure chronic ulcer of unspecified part of unspecified lower leg with fat layer exposed: Secondary | ICD-10-CM

## 2019-08-09 DIAGNOSIS — E119 Type 2 diabetes mellitus without complications: Secondary | ICD-10-CM | POA: Diagnosis present

## 2019-08-09 DIAGNOSIS — Z801 Family history of malignant neoplasm of trachea, bronchus and lung: Secondary | ICD-10-CM

## 2019-08-09 DIAGNOSIS — I214 Non-ST elevation (NSTEMI) myocardial infarction: Secondary | ICD-10-CM

## 2019-08-09 LAB — PROCALCITONIN: Procalcitonin: <0.1 ng/mL

## 2019-08-09 LAB — COMPREHENSIVE METABOLIC PANEL
ALT: 44 U/L (ref 0–44)
AST: 77 U/L — ABNORMAL HIGH (ref 15–41)
Albumin: 4 g/dL (ref 3.5–5.0)
Alkaline Phosphatase: 107 U/L (ref 38–126)
Anion gap: 17 — ABNORMAL HIGH (ref 5–15)
BUN: 12 mg/dL (ref 6–20)
CO2: 28 mmol/L (ref 22–32)
Calcium: 8 mg/dL — ABNORMAL LOW (ref 8.9–10.3)
Chloride: 91 mmol/L — ABNORMAL LOW (ref 98–111)
Creatinine, Ser: 1.03 mg/dL (ref 0.61–1.24)
GFR calc Af Amer: >60 mL/min (ref >60)
GFR calc non Af Amer: >60 mL/min (ref >60)
Glucose, Bld: 134 mg/dL — ABNORMAL HIGH (ref 70–99)
Potassium: 3.7 mmol/L (ref 3.5–5.1)
Sodium: 136 mmol/L (ref 135–145)
Total Bilirubin: 0.8 mg/dL (ref 0.3–1.2)
Total Protein: 8.2 g/dL — ABNORMAL HIGH (ref 6.5–8.1)

## 2019-08-09 LAB — PHOSPHORUS: Phosphorus: 2 mg/dL — ABNORMAL LOW (ref 2.5–4.6)

## 2019-08-09 LAB — URINALYSIS, COMPLETE (UACMP) WITH MICROSCOPIC
Bilirubin Urine: NEGATIVE
Glucose, UA: NEGATIVE mg/dL
Ketones, ur: 5 mg/dL — AB
Nitrite: NEGATIVE
Protein, ur: 30 mg/dL — AB
Specific Gravity, Urine: 1.02 (ref 1.005–1.030)
WBC, UA: >50 WBC/hpf — ABNORMAL HIGH (ref 0–5)
pH: 5 (ref 5.0–8.0)

## 2019-08-09 LAB — MRSA PCR SCREENING: MRSA by PCR: POSITIVE — AB

## 2019-08-09 LAB — CBC WITH DIFFERENTIAL/PLATELET
Abs Immature Granulocytes: 0.02 10*3/uL (ref 0.00–0.07)
Basophils Absolute: 0.1 10*3/uL (ref 0.0–0.1)
Basophils Relative: 1 %
Eosinophils Absolute: 0.1 10*3/uL (ref 0.0–0.5)
Eosinophils Relative: 2 %
HCT: 39 % (ref 39.0–52.0)
Hemoglobin: 12.5 g/dL — ABNORMAL LOW (ref 13.0–17.0)
Immature Granulocytes: 0 %
Lymphocytes Relative: 20 %
Lymphs Abs: 1.4 10*3/uL (ref 0.7–4.0)
MCH: 28.8 pg (ref 26.0–34.0)
MCHC: 32.1 g/dL (ref 30.0–36.0)
MCV: 89.9 fL (ref 80.0–100.0)
Monocytes Absolute: 0.7 10*3/uL (ref 0.1–1.0)
Monocytes Relative: 10 %
Neutro Abs: 4.5 10*3/uL (ref 1.7–7.7)
Neutrophils Relative %: 67 %
Platelets: 238 10*3/uL (ref 150–400)
RBC: 4.34 MIL/uL (ref 4.22–5.81)
RDW: 18.6 % — ABNORMAL HIGH (ref 11.5–15.5)
WBC: 6.9 10*3/uL (ref 4.0–10.5)
nRBC: 0 % (ref 0.0–0.2)

## 2019-08-09 LAB — APTT: aPTT: 37 seconds — ABNORMAL HIGH (ref 24–36)

## 2019-08-09 LAB — PROTIME-INR
INR: 2 — ABNORMAL HIGH (ref 0.8–1.2)
Prothrombin Time: 22.3 seconds — ABNORMAL HIGH (ref 11.4–15.2)

## 2019-08-09 LAB — HEPARIN LEVEL (UNFRACTIONATED): Heparin Unfractionated: 0.23 IU/mL — ABNORMAL LOW (ref 0.30–0.70)

## 2019-08-09 LAB — GLUCOSE, CAPILLARY
Glucose-Capillary: 107 mg/dL — ABNORMAL HIGH (ref 70–99)
Glucose-Capillary: 165 mg/dL — ABNORMAL HIGH (ref 70–99)
Glucose-Capillary: 157 mg/dL — ABNORMAL HIGH (ref 70–99)

## 2019-08-09 LAB — SARS CORONAVIRUS 2 BY RT PCR (HOSPITAL ORDER, PERFORMED IN ~~LOC~~ HOSPITAL LAB): SARS Coronavirus 2: NEGATIVE

## 2019-08-09 LAB — CK: Total CK: 349 U/L (ref 49–397)

## 2019-08-09 LAB — MAGNESIUM
Magnesium: 2.2 mg/dL (ref 1.7–2.4)
Magnesium: 2.1 mg/dL (ref 1.7–2.4)

## 2019-08-09 LAB — TROPONIN I (HIGH SENSITIVITY)
Troponin I (High Sensitivity): 49 ng/L — ABNORMAL HIGH (ref <18)
Troponin I (High Sensitivity): 56 ng/L — ABNORMAL HIGH (ref <18)

## 2019-08-09 LAB — ETHANOL: Alcohol, Ethyl (B): 350 mg/dL (ref <10)

## 2019-08-09 LAB — BETA-HYDROXYBUTYRIC ACID: Beta-Hydroxybutyric Acid: 0.14 mmol/L (ref 0.05–0.27)

## 2019-08-09 LAB — BRAIN NATRIURETIC PEPTIDE: B Natriuretic Peptide: 67.1 pg/mL (ref 0.0–100.0)

## 2019-08-09 LAB — LACTIC ACID, PLASMA
Lactic Acid, Venous: 3.6 mmol/L (ref 0.5–1.9)
Lactic Acid, Venous: 3.5 mmol/L (ref 0.5–1.9)

## 2019-08-09 LAB — AMMONIA: Ammonia: <9 umol/L — ABNORMAL LOW (ref 9–35)

## 2019-08-09 LAB — TYPE AND SCREEN
ABO/RH(D): A POS
Antibody Screen: NEGATIVE

## 2019-08-09 LAB — LIPASE, BLOOD: Lipase: 41 U/L (ref 11–51)

## 2019-08-09 MED ORDER — LORAZEPAM 2 MG/ML IJ SOLN
1.0000 mg | INTRAMUSCULAR | Status: AC | PRN
  Administered 2019-08-09: 2 mg via INTRAVENOUS
  Administered 2019-08-09: 4 mg via INTRAVENOUS
  Administered 2019-08-10: 2 mg via INTRAVENOUS
  Filled 2019-08-09: qty 2
  Filled 2019-08-09: qty 1
  Filled 2019-08-09: qty 2

## 2019-08-09 MED ORDER — WARFARIN - PHARMACIST DOSING INPATIENT
Freq: Every day | Status: DC
  Filled 2019-08-09: qty 1

## 2019-08-09 MED ORDER — ONDANSETRON HCL 4 MG/2ML IJ SOLN
4.0000 mg | Freq: Four times a day (QID) | INTRAMUSCULAR | Status: DC | PRN
  Administered 2019-08-09 – 2019-08-13 (×4): 4 mg via INTRAVENOUS
  Filled 2019-08-09 (×4): qty 2

## 2019-08-09 MED ORDER — FUROSEMIDE 10 MG/ML IJ SOLN
40.0000 mg | Freq: Every day | INTRAMUSCULAR | Status: DC
  Administered 2019-08-09 – 2019-08-12 (×4): 40 mg via INTRAVENOUS
  Filled 2019-08-09 (×4): qty 4

## 2019-08-09 MED ORDER — LISINOPRIL 5 MG PO TABS
2.5000 mg | ORAL_TABLET | Freq: Every day | ORAL | Status: DC
  Administered 2019-08-09 – 2019-08-12 (×4): 2.5 mg via ORAL
  Filled 2019-08-09 (×4): qty 1

## 2019-08-09 MED ORDER — FOLIC ACID 1 MG PO TABS
1.0000 mg | ORAL_TABLET | Freq: Every day | ORAL | Status: DC
  Administered 2019-08-09 – 2019-08-14 (×6): 1 mg via ORAL
  Filled 2019-08-09 (×6): qty 1

## 2019-08-09 MED ORDER — LORAZEPAM 2 MG/ML IJ SOLN: 0.0000 mg | Freq: Two times a day (BID) | INTRAMUSCULAR | Status: AC

## 2019-08-09 MED ORDER — METOPROLOL SUCCINATE ER 50 MG PO TB24
50.0000 mg | ORAL_TABLET | Freq: Every day | ORAL | Status: DC
  Administered 2019-08-09 – 2019-08-14 (×6): 50 mg via ORAL
  Filled 2019-08-09 (×6): qty 1

## 2019-08-09 MED ORDER — WARFARIN SODIUM 5 MG PO TABS
5.0000 mg | ORAL_TABLET | Freq: Once | ORAL | Status: AC
  Administered 2019-08-09: 5 mg via ORAL
  Filled 2019-08-09: qty 1

## 2019-08-09 MED ORDER — SODIUM CHLORIDE 0.9 % IV BOLUS
1000.0000 mL | Freq: Once | INTRAVENOUS | Status: AC
  Administered 2019-08-09: 1000 mL via INTRAVENOUS

## 2019-08-09 MED ORDER — IOHEXOL 350 MG/ML SOLN
100.0000 mL | Freq: Once | INTRAVENOUS | Status: AC | PRN
  Administered 2019-08-09: 100 mL via INTRAVENOUS

## 2019-08-09 MED ORDER — INSULIN ASPART 100 UNIT/ML ~~LOC~~ SOLN
0.0000 [IU] | Freq: Three times a day (TID) | SUBCUTANEOUS | Status: DC
  Administered 2019-08-09: 4 [IU] via SUBCUTANEOUS
  Administered 2019-08-10: 3 [IU] via SUBCUTANEOUS
  Administered 2019-08-11: 7 [IU] via SUBCUTANEOUS
  Administered 2019-08-11: 3 [IU] via SUBCUTANEOUS
  Administered 2019-08-11: 4 [IU] via SUBCUTANEOUS
  Administered 2019-08-12: 7 [IU] via SUBCUTANEOUS
  Administered 2019-08-12: 3 [IU] via SUBCUTANEOUS
  Administered 2019-08-12: 4 [IU] via SUBCUTANEOUS
  Filled 2019-08-09 (×9): qty 1

## 2019-08-09 MED ORDER — LORAZEPAM 1 MG PO TABS
1.0000 mg | ORAL_TABLET | ORAL | Status: AC | PRN
  Administered 2019-08-09 (×2): 4 mg via ORAL
  Administered 2019-08-10: 2 mg via ORAL
  Filled 2019-08-09: qty 2
  Filled 2019-08-09 (×2): qty 4

## 2019-08-09 MED ORDER — SODIUM CHLORIDE 0.9 % IV SOLN
2.0000 g | Freq: Once | INTRAVENOUS | Status: AC
  Administered 2019-08-09: 2 g via INTRAVENOUS
  Filled 2019-08-09: qty 20

## 2019-08-09 MED ORDER — LORAZEPAM 2 MG/ML IJ SOLN
0.0000 mg | Freq: Four times a day (QID) | INTRAMUSCULAR | Status: AC
  Administered 2019-08-09: 1 mg via INTRAVENOUS
  Administered 2019-08-09: 2 mg via INTRAVENOUS
  Administered 2019-08-09 – 2019-08-10 (×2): 1 mg via INTRAVENOUS
  Filled 2019-08-09 (×2): qty 1
  Filled 2019-08-09: qty 2
  Filled 2019-08-09: qty 1

## 2019-08-09 MED ORDER — LACTATED RINGERS IV BOLUS (SEPSIS)
200.0000 mL | Freq: Once | INTRAVENOUS | Status: AC
  Administered 2019-08-09: 200 mL via INTRAVENOUS

## 2019-08-09 MED ORDER — ACETAMINOPHEN 325 MG PO TABS
650.0000 mg | ORAL_TABLET | ORAL | Status: DC | PRN
  Administered 2019-08-10 – 2019-08-12 (×4): 650 mg via ORAL
  Filled 2019-08-09 (×4): qty 2

## 2019-08-09 MED ORDER — ASPIRIN EC 81 MG PO TBEC
81.0000 mg | DELAYED_RELEASE_TABLET | Freq: Every day | ORAL | Status: DC
  Administered 2019-08-10 – 2019-08-14 (×5): 81 mg via ORAL
  Filled 2019-08-09 (×5): qty 1

## 2019-08-09 MED ORDER — ATORVASTATIN CALCIUM 20 MG PO TABS
40.0000 mg | ORAL_TABLET | Freq: Every day | ORAL | Status: DC
  Administered 2019-08-09 – 2019-08-14 (×6): 40 mg via ORAL
  Filled 2019-08-09 (×6): qty 2

## 2019-08-09 MED ORDER — THIAMINE HCL 100 MG/ML IJ SOLN
100.0000 mg | INTRAMUSCULAR | Status: AC
  Administered 2019-08-09: 100 mg via INTRAVENOUS
  Filled 2019-08-09: qty 2

## 2019-08-09 MED ORDER — NITROGLYCERIN 0.4 MG SL SUBL: 0.4000 mg | SUBLINGUAL_TABLET | SUBLINGUAL | Status: DC | PRN

## 2019-08-09 MED ORDER — LACTATED RINGERS IV BOLUS (SEPSIS)
1000.0000 mL | Freq: Once | INTRAVENOUS | Status: AC
  Administered 2019-08-09: 1000 mL via INTRAVENOUS

## 2019-08-09 MED ORDER — VANCOMYCIN HCL IN DEXTROSE 1-5 GM/200ML-% IV SOLN
1000.0000 mg | Freq: Once | INTRAVENOUS | Status: DC
  Filled 2019-08-09: qty 200

## 2019-08-09 MED ORDER — VANCOMYCIN HCL 500 MG/100ML IV SOLN
500.0000 mg | Freq: Once | INTRAVENOUS | Status: AC
  Administered 2019-08-09: 500 mg via INTRAVENOUS
  Filled 2019-08-09: qty 100

## 2019-08-09 MED ORDER — VANCOMYCIN HCL 1500 MG/300ML IV SOLN
1500.0000 mg | Freq: Two times a day (BID) | INTRAVENOUS | Status: DC
  Administered 2019-08-09 – 2019-08-12 (×6): 1500 mg via INTRAVENOUS
  Filled 2019-08-09 (×9): qty 300

## 2019-08-09 MED ORDER — SODIUM CHLORIDE 0.9 % IV SOLN
2.0000 g | Freq: Three times a day (TID) | INTRAVENOUS | Status: DC
  Administered 2019-08-09 – 2019-08-13 (×13): 2 g via INTRAVENOUS
  Filled 2019-08-09 (×15): qty 2

## 2019-08-09 MED ORDER — HEPARIN (PORCINE) 25000 UT/250ML-% IV SOLN
1600.0000 [IU]/h | INTRAVENOUS | Status: DC
  Administered 2019-08-09: 1600 [IU]/h via INTRAVENOUS
  Filled 2019-08-09: qty 250

## 2019-08-09 MED ORDER — OXYCODONE HCL 5 MG PO TABS
5.0000 mg | ORAL_TABLET | Freq: Four times a day (QID) | ORAL | Status: DC | PRN
  Administered 2019-08-09 – 2019-08-13 (×9): 5 mg via ORAL
  Filled 2019-08-09 (×10): qty 1

## 2019-08-09 MED ORDER — VANCOMYCIN HCL 2000 MG/400ML IV SOLN
2000.0000 mg | Freq: Once | INTRAVENOUS | Status: AC
  Administered 2019-08-09: 2000 mg via INTRAVENOUS
  Filled 2019-08-09: qty 400

## 2019-08-09 MED ORDER — THIAMINE HCL 100 MG PO TABS
100.0000 mg | ORAL_TABLET | Freq: Every day | ORAL | Status: DC
  Administered 2019-08-09 – 2019-08-14 (×6): 100 mg via ORAL
  Filled 2019-08-09 (×6): qty 1

## 2019-08-09 MED ORDER — THIAMINE HCL 100 MG/ML IJ SOLN
100.0000 mg | Freq: Every day | INTRAMUSCULAR | Status: DC
  Filled 2019-08-09 (×2): qty 2

## 2019-08-09 MED ORDER — METRONIDAZOLE IN NACL 5-0.79 MG/ML-% IV SOLN
500.0000 mg | Freq: Three times a day (TID) | INTRAVENOUS | Status: DC
  Administered 2019-08-09: 500 mg via INTRAVENOUS
  Filled 2019-08-09: qty 100

## 2019-08-09 MED ORDER — ADULT MULTIVITAMIN W/MINERALS CH
1.0000 | ORAL_TABLET | Freq: Every day | ORAL | Status: DC
  Administered 2019-08-09 – 2019-08-14 (×6): 1 via ORAL
  Filled 2019-08-09 (×6): qty 1

## 2019-08-09 MED ORDER — INSULIN ASPART 100 UNIT/ML ~~LOC~~ SOLN: 0.0000 [IU] | Freq: Every day | SUBCUTANEOUS | Status: DC

## 2019-08-09 NOTE — Progress Notes (Signed)
Pharmacy Antibiotic Note  [name] [name] is a 59 y.o. male admitted on 08/09/2019. Pharmacy has been consulted for vancomycin  dosing.  Plan: Vancomycin 2500 mg given in ED. Will order vanc 1500 mg IV q12h to start tonight at 2000. Follow renal function  Height: 5\' 9"  (175.3 cm) Weight: (!) 195 kg (430 lb) IBW/kg (Calculated) : 70.7  Temp (24hrs), Avg:98.3 F (36.8 C), Min:98.3 F (36.8 C), Max:98.3 F (36.8 C)  Recent Labs  Lab 08/09/19 0346 08/09/19 0621  WBC 6.9  --   CREATININE 1.03  --   LATICACIDVEN 3.6* 3.5*    Estimated Creatinine Clearance: 133.1 mL/min (by C-G formula based on SCr of 1.03 mg/dL).    No Known Allergies  Antimicrobials this admission: Vancomcyin 6/24 >> Cefepime 6/24 >>   Microbiology results: 6/24 BCx: NG 12 hr   Thank you for allowing pharmacy to be a part of this patient's care.  7/24, PharmD 08/09/2019 12:58 PM

## 2019-08-09 NOTE — ED Triage Notes (Signed)
Pt comes from home with complaints of chest pain. Pt began to experience chest pain Tuesday of this week and reports he sat on his chair and has not gotten up since. Pt reports to EMS that he has been experiencing blood in his stool, on arrival pt states the blood has been bright red. PT has dressing on his bilateral lower legs that he reports have not been changed in 1 week. Pt reports to EMS that he has drank approx 1/5 of liquor tonight PTA. Pt did take 325mg  of ASA prior to EMS arrival.

## 2019-08-09 NOTE — Progress Notes (Addendum)
Pharmacy Antibiotic Note  [name] [name] is a 59 y.o. male admitted on 08/09/2019 with cellulitis.  Pharmacy has been consulted for Cefepime, Vancomycin  dosing.  Plan: Cefepime 2 gm IV Q8H .   Vancomycin 2 gm IV X 1 given on 6/24 @ 0451. Vancomycin 1 gm IV Q8H ordered to start on 6/24 @ 1300.  Vancomycin trough on 6/25 @ 1230  Height: 5\' 9"  (175.3 cm) Weight: (!) 195 kg (430 lb) IBW/kg (Calculated) : 70.7  Temp (24hrs), Avg:98.3 F (36.8 C), Min:98.3 F (36.8 C), Max:98.3 F (36.8 C)  Recent Labs  Lab 08/09/19 0346  WBC 6.9  CREATININE 1.03  LATICACIDVEN 3.6*    Estimated Creatinine Clearance: 133.1 mL/min (by C-G formula based on SCr of 1.03 mg/dL).    No Known Allergies  Antimicrobials this admission:   >>    >>   Dose adjustments this admission:   Microbiology results:  BCx:   UCx:    Sputum:    MRSA PCR:   Thank you for allowing pharmacy to be a part of this patient's care.  [name] 08/09/2019 6:28 AM

## 2019-08-09 NOTE — ED Notes (Signed)
Date and time results received: 08/09/19 0432   Test: Lactic Acid Critical Value: 3.6  Name of Provider Notified: York Cerise

## 2019-08-09 NOTE — Consult Note (Signed)
WOC Nurse Consult Note: Reason for Consult: FUll thickness tissue loss to bilateral anterior lower legs.   Wound type:venous insufficiency  Wears compression.  Patient is withdrawing and cannot tolerate wound care Pressure Injury POA: NA Measurement: Left lateral lower leg:  9 cm x 7 cm x 0.4 cm  Left anterior lower leg:  3 cm x 2 cm x 0.2 cm  Right lateral lower leg:  2 cmx 4 cm x 0.2 cm  Wound QEH:AZCUN red Drainage (amount, consistency, odor) minimal serosanguinous  No odor.  Periwound:edema and erythema, warmth Dressing procedure/placement/frequency:  WOC team will manage wraps once patient is admitted to medical floor. Cleanse wounds to lower legs with NS and pat dry. Apply aquacel AG to open wounds.  Wrap bilateral lower legs with profore compression.  Change weekly while in house Will not follow at this time.  Please re-consult if needed.  Maple Hudson MSN, RN, FNP-BC CWON Wound, Ostomy, Continence Nurse Pager [phone]

## 2019-08-09 NOTE — ED Notes (Signed)
Awaiting to receive Vancomycin from pharmacy

## 2019-08-09 NOTE — ED Notes (Signed)
Pt reports he has a home health nurse come assist with his care at home but reports her being on vacation this week. Pt reports his sister-in-law bring him food and alcohol to the home

## 2019-08-09 NOTE — H&P (Signed)
History and Physical    [name] [name] WLN:[id number] DOB: 03-05-1960 DOA: 08/09/2019  PCP: Berniece Pap, FNP   Patient coming from: home  I have personally briefly reviewed patient's old medical records in [facility]  Chief Complaint: chest pain  HPI: [name] [name] is a 59 y.o. male with medical history significant for  sCHF with EF of 25-30%, , hypertension, hyperlipidemia, COPD, asthma, hypothyroidism, gout, depression, atrial fibrillation, PE/DVT on Coumadin, OSA on CPAP, morbid obesity, ambulant with walker, CAD, chronic venous stasis ulcers cared for by home health nurse, who presents to the emergency room with a complaint of chest pain that has been continuous for the past couple days.  Describes pain as tightness in the retrosternal area, nonradiating, that is constant and of moderate intensity.  It is aggravated with exertion.  He has associated mild shortness of breath.  He denies nausea vomiting or diaphoresis.  Denies abdominal pain, diarrhea or vomiting.  Denies cough fever or chills.  Patient admits to drinking heavily over the past few days. ED Course: On arrival, patient was tachycardic at 115 with otherwise normal vitals.  EKG showed A. fib borderline ST depression in lateral leads.  Troponin elevated at 49.  He had a normal WBC of 6.9 but with elevated lactic acid of 3.6 and noted to have ulcerations lower extremities.  EtOH level 350.  BNP normal at 67, ammonia less than 9.  Chest x-ray showed vascular congestion and cardiomegaly with at most minimal interstitial edema.  Review of Systems: As per HPI otherwise all other systems on review of systems negative.    Past Medical History:  Diagnosis Date  . Anxiety   . Arthritis   . Asthma   . Brain damage   . Chronic pain of both knees 07/13/2018  . Clotting disorder (HCC)   . COPD (chronic obstructive pulmonary disease) (HCC)   . Depression   . HFrEF (heart failure with reduced ejection fraction) (HCC)     a. 03/2018 Echo: EF 25-30%, diff HK. Mod LAE.  [name] History of DVT (deep vein thrombosis)   . History of pulmonary embolism    a. Chronic coumadin.  [name] Hypertension   . MI (myocardial infarction) (HCC)   . Morbid obesity (HCC)   . Neck pain 07/13/2018  . NICM (nonischemic cardiomyopathy) (HCC)    a. s/p Cath x 3 - reportedly nl cors. Last cath 2019 in [place]; b. a. 03/2018 Echo: EF 25-30%, diff HK.  [name] Persistent atrial fibrillation (HCC)    a. 03/2018 s/p DCCV; b. CHA2DS2VASc = 1-->Xarelto (later changed to warfarin); c. 05/2018 recurrent afib-->Amio initiated.  . Sleep apnea     Past Surgical History:  Procedure Laterality Date  . CARDIOVERSION N/A 03/24/2018   Procedure: CARDIOVERSION;  Surgeon: Antonieta Iba, MD;  Location: [facility] ORS;  Service: Cardiovascular;  Laterality: N/A;  . CARDIOVERSION N/A 08/08/2018   Procedure: CARDIOVERSION;  Surgeon: [name], MD;  Location: [facility] ORS;  Service: Cardiovascular;  Laterality: N/A;  . hearnia repair     X 3- total of two surgeries  . HERNIA REPAIR    . LEG SURGERY       reports that he has never smoked. He has never used smokeless tobacco. He reports previous alcohol use. He reports that he does not use drugs.  No Known Allergies  Family History  Problem Relation Age of Onset  . Heart failure Mother   . Lung cancer Mother   . Lung cancer Father   .  Heart attack Maternal Grandmother   . Heart attack Maternal Grandfather       Prior to Admission medications   Medication Sig Start Date End Date Taking? Authorizing Provider  albuterol (VENTOLIN HFA) 108 (90 Base) MCG/ACT inhaler Inhale 2 puffs into the lungs every 6 (six) hours as needed for wheezing or shortness of breath. 07/04/18   Johnson, Megan P, DO  allopurinol (ZYLOPRIM) 100 MG tablet Take 1 tablet (100 mg total) by mouth daily. 05/08/19   Flinchum, [name], FNP  ALPRAZolam Prudy Feeler) 0.5 MG tablet Take 1 tablet (0.5 mg total) by mouth 2 (two) times daily as needed for anxiety or  sleep. 06/26/19   Arnetha Courser, MD  colchicine 0.6 MG tablet 1 tab PO q 12 hrs as needed until gout flare subsides, schedule on office follow up Patient taking differently: Take 0.6 mg by mouth daily as needed.  05/30/19   Flinchum, [name], FNP  DULoxetine (CYMBALTA) 20 MG capsule Take 1 capsule (20 mg total) by mouth daily. 05/08/19   Flinchum, [name], FNP  ferrous sulfate 325 (65 FE) MG tablet Take 1 tablet (325 mg total) by mouth 2 (two) times daily with a meal. 05/08/19   Flinchum, [name], FNP  gabapentin (NEURONTIN) 300 MG capsule Take 1 capsule (300 mg total) by mouth 3 (three) times daily. 05/08/19 08/06/19  Flinchum, [name], FNP  HYDROcodone-acetaminophen (NORCO/VICODIN) 5-325 MG tablet Take 1-2 tablets by mouth every 6 (six) hours as needed for moderate pain or severe pain. 06/26/19   Arnetha Courser, MD  levothyroxine (SYNTHROID) 50 MCG tablet Take 1 tablet (50 mcg total) by mouth daily before breakfast. Need lab work beginning of June for Thyroid 05/08/19   Flinchum, [name], FNP  lisinopril (ZESTRIL) 2.5 MG tablet Take 1 tablet (2.5 mg total) by mouth daily. 05/08/19   Flinchum, [name], FNP  loperamide (IMODIUM) 2 MG capsule Take 1 capsule (2 mg total) by mouth as needed for diarrhea or loose stools. 06/26/19   Arnetha Courser, MD  metoprolol succinate (TOPROL-XL) 50 MG 24 hr tablet Take 1 tablet (50 mg total) by mouth daily. Take with or immediately following a meal. 06/06/19   End, Cristal Deer, MD  nitroGLYCERIN (NITROSTAT) 0.4 MG SL tablet Place 1 tablet under tongue every 5 minutes as needed for chest pain. (No more than 3 doses within 15 minutes) 03/08/19   Flinchum, [name], FNP  torsemide (DEMADEX) 20 MG tablet Take 4 tablets (80 mg total) by mouth 2 (two) times daily. 06/26/19   Arnetha Courser, MD  traZODone (DESYREL) 150 MG tablet TAKE 1 TABLET BY MOUTH AT BEDTIME AS NEEDED SLEEP 05/08/19   Flinchum, [name], FNP  warfarin (COUMADIN) 5 MG tablet Take 1 tablet (5 mg  total) by mouth daily. 06/26/19 06/25/20  Arnetha Courser, MD    Physical Exam: Vitals:   08/09/19 0430 08/09/19 0500 08/09/19 0530 08/09/19 0600  BP: 130/75 97/83 (!) 100/59   Pulse: (!) 127 (!) 109 (!) 108 100  Resp: 17 (!) 23 (!) 22 20  Temp:      TempSrc:      SpO2: 100% 98% 96% 95%  Weight:      Height:         Vitals:   08/09/19 0430 08/09/19 0500 08/09/19 0530 08/09/19 0600  BP: 130/75 97/83 (!) 100/59   Pulse: (!) 127 (!) 109 (!) 108 100  Resp: 17 (!) 23 (!) 22 20  Temp:      TempSrc:  SpO2: 100% 98% 96% 95%  Weight:      Height:          Constitutional: Alert and oriented x 3 .  Appears to be in mild pain discomfort and has conversational dyspnea HEENT:      Head: Normocephalic and atraumatic.         Eyes: PERLA, EOMI, Conjunctivae are normal. Sclera is non-icteric.       Mouth/Throat: Mucous membranes are moist.       Neck: Supple with no signs of meningismus. Cardiovascular:  Irregularly irregular. No murmurs, gallops, or rubs.  Pulses difficult to palpate. No JVD. 3+LE edema Respiratory: Respiratory effort increased.Lungs sounds diminished with faint wheezes bibasilarly Gastrointestinal: Soft, non tender, and non distended with positive bowel sounds. No rebound or guarding. Genitourinary: No CVA tenderness. Musculoskeletal: Nontender with normal range of motion in all extremities. No cyanosis.  See skin for further description Neurologic: Normal speech and language. Face is symmetric. Moving all extremities. No gross focal neurologic deficits . Skin: Skin is warm, dry.  Ulcers left and right lower extremities with fat layer exposed.  Dimensions deferred.  Lesions are near circumferential about 8 cm above ankles Psychiatric: Mood and affect are normal Speech and behavior are normal   Labs on Admission: I have personally reviewed following labs and imaging studies  CBC: Recent Labs  Lab 08/09/19 0346  WBC 6.9  NEUTROABS 4.5  HGB 12.5*  HCT 39.0  MCV  89.9  PLT 606   Basic Metabolic Panel: Recent Labs  Lab 08/09/19 0346  NA 136  K 3.7  CL 91*  CO2 28  GLUCOSE 134*  BUN 12  CREATININE 1.03  CALCIUM 8.0*  MG 2.2  PHOS 2.0*   GFR: Estimated Creatinine Clearance: 133.1 mL/min (by C-G formula based on SCr of 1.03 mg/dL). Liver Function Tests: Recent Labs  Lab 08/09/19 0346  AST 77*  ALT 44  ALKPHOS 107  BILITOT 0.8  PROT 8.2*  ALBUMIN 4.0   Recent Labs  Lab 08/09/19 0346  LIPASE 41   Recent Labs  Lab 08/09/19 0346  AMMONIA <9*   Coagulation Profile: Recent Labs  Lab 08/09/19 0346  INR 2.0*   Cardiac Enzymes: Recent Labs  Lab 08/09/19 0346  CKTOTAL 349   BNP (last 3 results) No results for input(s): PROBNP in the last 8760 hours. HbA1C: No results for input(s): HGBA1C in the last 72 hours. CBG: No results for input(s): GLUCAP in the last 168 hours. Lipid Profile: No results for input(s): CHOL, HDL, LDLCALC, TRIG, CHOLHDL, LDLDIRECT in the last 72 hours. Thyroid Function Tests: No results for input(s): TSH, T4TOTAL, FREET4, T3FREE, THYROIDAB in the last 72 hours. Anemia Panel: No results for input(s): VITAMINB12, FOLATE, FERRITIN, TIBC, IRON, RETICCTPCT in the last 72 hours. Urine analysis:    Component Value Date/Time   COLORURINE YELLOW (A) 06/24/2019 1800   APPEARANCEUR HAZY (A) 06/24/2019 1800   APPEARANCEUR Hazy (A) 12/25/2018 1442   LABSPEC 1.025 06/24/2019 1800   PHURINE 6.0 06/24/2019 1800   GLUCOSEU NEGATIVE 06/24/2019 1800   HGBUR NEGATIVE 06/24/2019 1800   BILIRUBINUR NEGATIVE 06/24/2019 1800   BILIRUBINUR Negative 12/25/2018 1442   KETONESUR NEGATIVE 06/24/2019 1800   PROTEINUR NEGATIVE 06/24/2019 1800   NITRITE NEGATIVE 06/24/2019 1800   LEUKOCYTESUR SMALL (A) 06/24/2019 1800    Radiological Exams on Admission: DG Ankle 2 Views Left  Result Date: 08/09/2019 CLINICAL DATA:  Acute on chronic wounds EXAM: LEFT ANKLE - 2 VIEW COMPARISON:  None. FINDINGS:  Extensive severe  edematous changes of the lower extremities with some benign-appearing soft tissue mineralization, likely sequela of chronic venous stasis. Atherosclerotic calcifications are present in the soft tissues. No radiographically evident site of focal ulceration is seen. No worrisome destructive, erosive, subcortical lucent or periostitis changes to suggest features of osteomyelitis. Mild to moderate degenerative changes present in the ankle, mid and hindfoot. Midfoot and hindfoot alignment is grossly preserved though incompletely assessed on nondedicated, nonweightbearing films. Remote posttraumatic deformity of the proximal tibial and distal fibular diaphyses. Bidirectional calcaneal spurs. IMPRESSION: 1. Extensive severe edematous changes of the lower extremities with some benign-appearing soft tissue mineralization, likely sequela of chronic venous stasis. 2. No radiographically evident site of focal ulceration nor convincing radiographic evidence of acute osteomyelitis. If persisting concern, MRI is more sensitive and specific for early changes. 3. Degenerative changes as above. Electronically Signed   By: Kreg Shropshire M.D.   On: 08/09/2019 04:59   DG Ankle 2 Views Right  Result Date: 08/09/2019 CLINICAL DATA:  Acute on chronic wounds, concern for osteomyelitis EXAM: RIGHT ANKLE - 2 VIEW COMPARISON:  None. FINDINGS: Diffuse edematous changes of the lower extremity with extensive vascular calcifications. No discernible focal soft tissue ulceration. No destructive, erosive, subcortical lucent or periostitis changes to suggest a discernible site of acute osteomyelitis. Corticated os trigonum posterior to the talus. Os peroneum noted along the arch. Bidirectional calcaneal spurs. Moderate to severe arthrosis about the ankle with spurring along the medial and lateral malleoli. Mild to moderate degenerative changes in the mid and hindfoot. Midfoot and hindfoot alignment is grossly preserved though incompletely assessed  on nonweightbearing films. IMPRESSION: 1. No discernible focal soft tissue ulceration nor convincing site radiographically evident osteomyelitis. If there is continued clinical concern, MRI is more sensitive and specific. 2. Diffuse edematous changes of the lower extremity with extensive vascular calcifications. 3. Degenerative changes, as above. Electronically Signed   By: Kreg Shropshire M.D.   On: 08/09/2019 04:52   DG Chest Port 1 View  Result Date: 08/09/2019 CLINICAL DATA:  Chest pain, bright red blood per rectum EXAM: PORTABLE CHEST 1 VIEW COMPARISON:  Radiograph 06/22/2019, CT 03/04/2019 FINDINGS: Low lung volumes and central vascular congestion. Cardiomegaly is similar to prior. Hazy and interstitial opacities in the lungs without focal consolidation. No pneumothorax or visible effusion. No acute osseous or soft tissue abnormality. Telemetry leads overlie the chest. IMPRESSION: Low volumes and atelectasis. Vascular congestion and cardiomegaly with at most minimal interstitial edema. Electronically Signed   By: Kreg Shropshire M.D.   On: 08/09/2019 04:49    EKG: Independently reviewed. Interpretation : Atrial fibrillation with possible ST depression lateral leads  Assessment/Plan Principal Problem: Possible sepsis (HCC)   Cellulitis of both lower extremities    Infected venous stasis ulcers Lower extremity ulceration, unspecified laterality, with fat layer exposed (HCC) -Patient with borderline sepsis criteria.  Patient has tachycardia but no fever or leukocytosis but with cellulitis bilateral lower extremities and lactic acid of 3.9(though elevated lactic acid might be related to alcohol use) -Sepsis protocol initiated from the emergency room -X-rays bilateral lower extremities with no overt signs of osteomyelitis -Consider surgical consult for deep tissue culture -Continue vancomycin and cefepime started from the emergency room  NSTEMI, possible demand ischemia -Patient presents initially  with chest pain.  EKG with minimal ST depression in lateral leads and troponin 49 -Continue to trend troponin -Continue heparin infusion started in the ER(hold home Coumadin) -Continue aspirin, atorvastatin and metoprolol -Nitroglycerin sublingual as needed chest pain with  morphine for breakthrough -Cardiology consult in the a.m.    Chronic combined systolic and diastolic heart failure (HCC) -Appears euvolemic.  BNP normal.  Chest x-ray with minimal interstitial edema -Continue home diuretic, ACE/ARB and beta-blocker -Daily weights and monitor for fluid overload in view of sepsis fluid bolus received in the emergency room    COPD (chronic obstructive pulmonary disease) (HCC) -Not acutely exacerbated -Continue home inhalers with duo nebs as needed    Obstructive sleep apnea -CPAP as requested by patient    Diet-controlled diabetes mellitus (HCC) -Sliding scale insulin coverage and consistent carbohydrate diet    HTN (hypertension) -Blood pressure controlled.  Continue home antihypertensives    Morbid obesity (HCC) -This complicates overall prognosis and care   Chronic anticoagulation (warfarin  COUMADIN) -Patient has history of DVT as well as A. fib -Coumadin on hold as patient is on heparin infusion for possible NSTEMI    Alcoholic intoxication without complication (HCC) -EtOH level 350 -CIWA withdrawal protocol    DVT prophylaxis: Heparin infusion Code Status: full code  Family Communication:  none  Disposition Plan: Back to previous home environment Consults called: none  Status:At the time of admission, it appears that the appropriate admission status for this patient is INPATIENT. This is judged to be reasonable and necessary in order to provide the required intensity of service to ensure the patient's safety given the presenting symptoms, physical exam findings, and initial radiographic and laboratory data in the context of their  Comorbid conditions.   Patient  requires inpatient status due to high intensity of service, high risk for further deterioration and high frequency of surveillance required.   I certify that at the point of admission it is my clinical judgment that the patient will require inpatient hospital care spanning beyond 2 midnights     Andris Baumann MD [facility]     08/09/2019, 6:22 AM

## 2019-08-09 NOTE — Consult Note (Signed)
ANTICOAGULATION CONSULT NOTE - Initial Consult  Pharmacy Consult for Warfarin Dosing  Indication: atrial fibrillation  No Known Allergies  Patient Measurements: Height: 5\' 9"  (175.3 cm) Weight: (!) 195 kg (430 lb) IBW/kg (Calculated) : 70.7  Vital Signs: Temp: 98.3 F (36.8 C) (06/24 0320) Temp Source: Oral (06/24 0320) BP: 118/62 (06/24 1250) Pulse Rate: 108 (06/24 1250)  Labs: Recent Labs    08/09/19 0346 08/09/19 1254  HGB 12.5*  --   HCT 39.0  --   PLT 238  --   APTT 37*  --   LABPROT 22.3*  --   INR 2.0*  --   HEPARINUNFRC  --  0.23*  CREATININE 1.03  --   CKTOTAL 349  --   TROPONINIHS 49*  --     Estimated Creatinine Clearance: 133.1 mL/min (by C-G formula based on SCr of 1.03 mg/dL).   Medical History: Past Medical History:  Diagnosis Date   Anxiety    Arthritis    Asthma    Brain damage    Chronic pain of both knees 07/13/2018   Clotting disorder (HCC)    COPD (chronic obstructive pulmonary disease) (HCC)    Depression    HFrEF (heart failure with reduced ejection fraction) (HCC)    a. 03/2018 Echo: EF 25-30%, diff HK. Mod LAE.   History of DVT (deep vein thrombosis)    History of pulmonary embolism    a. Chronic coumadin.   Hypertension    MI (myocardial infarction) (HCC)    Morbid obesity (HCC)    Neck pain 07/13/2018   NICM (nonischemic cardiomyopathy) (HCC)    a. s/p Cath x 3 - reportedly nl cors. Last cath 2019 in [place]; b. a. 03/2018 Echo: EF 25-30%, diff HK.   Persistent atrial fibrillation (HCC)    a. 03/2018 s/p DCCV; b. CHA2DS2VASc = 1-->Xarelto (later changed to warfarin); c. 05/2018 recurrent afib-->Amio initiated.   Sleep apnea     Assessment: Pharmacy consulted for warfarin dosing and monitoring for 59 yo male with PMH of A. Fib. INR therapeutic on admission.  Home Regimen: Warfarin 5mg    DATE INR DOSE 6/24 2.0  Goal of Therapy:  INR: 2-3   Plan:  Will order patient's home dose of warfarin 5mg  this evening.    INR ordered with AM labs daily while on antibiotic per protocol.  Pharmacy to F/U 6/25.   7/24, PharmD, BCPS Clinical Pharmacist 08/09/2019 1:37 PM

## 2019-08-09 NOTE — TOC Initial Note (Signed)
Transition of Care [facility]) - Initial/Assessment Note    Patient Details  Name: [name] [name] MRN: [id number] Date of Birth: 05-26-1960  Transition of Care [facility]) CM/SW Contact:    [place] Cellar, RN Phone Number: 08/09/2019, 10:50 AM  Clinical Narrative:                 Patient currently unable to answer questions. Nurse is working with patient and patient is confused at current time and pulling at wires/tubing.         Patient Goals and CMS Choice        Expected Discharge Plan and Services                                                Prior Living Arrangements/Services                       Activities of Daily Living      Permission Sought/Granted                  Emotional Assessment              Admission diagnosis:  NSTEMI (non-ST elevated myocardial infarction) [facility]) [I21.4] Patient Active Problem List   Diagnosis Date Noted  . NSTEMI (non-ST elevated myocardial infarction) (HCC) 08/09/2019  . Lower extremity ulceration, unspecified laterality, with fat layer exposed (HCC) 08/09/2019  . Alcoholic intoxication without complication (HCC) 08/09/2019  . Pressure injury of skin 06/26/2019  . Cellulitis due to methicillin-resistant Staphylococcus aureus (MRSA) 06/23/2019  . Iron deficiency anemia 06/22/2019  . Depression 06/22/2019  . Elevated troponin 06/22/2019  . Left-sided Bell's palsy 05/08/2019  . Slurred speech 05/03/2019  . Chronic intractable headache 05/03/2019  . Head injuries, initial encounter 05/03/2019  . Hand pain, left 05/03/2019  . Noncompliance by refusing service 05/03/2019  . Facial droop 05/02/2019  . Pharmacologic therapy 04/11/2019  . Disorder of skeletal system 04/11/2019  . Problems influencing health status 04/11/2019  . Chronic anticoagulation (warfarin  COUMADIN) 04/11/2019  . Hypocalcemia 04/11/2019  . Elevated sed rate 04/11/2019  . Elevated C-reactive protein (CRP) 04/11/2019  . Elevated  hemoglobin A1c 04/11/2019  . Hypoalbuminemia 04/11/2019  . Edema due to hypoalbuminemia 04/11/2019  . Elevated brain natriuretic peptide (BNP) level 04/11/2019  . Chronic hip pain (Right) 04/11/2019  . Osteoarthritis of hip (Right) 04/11/2019  . Atrial fibrillation with RVR (HCC) 03/08/2019  . Degenerative joint disease of right hip 03/05/2019  . Hypothyroidism 03/04/2019  . Chronic venous stasis dermatitis of both lower extremities 03/04/2019  . Long term (current) use of anticoagulants 02/23/2019  . Cellulitis 01/22/2019  . PE (pulmonary thromboembolism) (HCC) 01/21/2019  . Open leg wound 01/21/2019  . HLD (hyperlipidemia) 01/21/2019  . CAD (coronary artery disease) 01/21/2019  . Cellulitis of both lower extremities 01/21/2019  . Noncompliance 01/09/2019  . Acquired thrombophilia (HCC) 11/28/2018  . Subtherapeutic international normalized ratio (INR) 11/11/2018  . Acute pulmonary embolism (HCC) 11/01/2018  . Depression, major, single episode, moderate (HCC) 09/17/2018  . Chest pain 08/06/2018  . Personal history of DVT (deep vein thrombosis) 07/13/2018  . History of pulmonary embolism (on Coumadin) 07/13/2018  . Chronic low back pain (Bilateral)  w/ sciatica (Bilateral) 07/13/2018  . TBI (traumatic brain injury) (HCC) 07/04/2018  . Anemia 06/27/2018  . Abnormal thyroid blood test 06/27/2018  . Diet-controlled  diabetes mellitus ([place]) 06/06/2018  . HTN (hypertension) 06/06/2018  . Chronic gout involving toe, unspecified cause, unspecified laterality 06/06/2018  . Morbid obesity ([place]) 06/06/2018  . Chronic pain syndrome 06/06/2018  . Ulcers of both lower extremities, limited to breakdown of skin ([place]) 06/06/2018  . Gout 06/06/2018  . Persistent atrial fibrillation ([place])   . Sepsis ([place]) 03/17/2018  . Chronic combined systolic and diastolic heart failure ([place]) 02/02/2018  . COPD (chronic obstructive pulmonary disease) ([place]) 02/02/2018  . Obstructive sleep apnea 02/02/2018  .  Lymphedema 02/02/2018  . Acute on chronic systolic CHF (congestive heart failure) ([place]) 02/02/2018  . Acute respiratory failure with hypoxia (HCC) 01/27/2018   PCP:  Doreen Beam, FNP Pharmacy:   [facility], [place] [address] Phone: [phone] Fax: [phone]     Social Determinants of Health (SDOH) Interventions    Readmission Risk Interventions Readmission Risk Prevention Plan 01/25/2019 10/25/2018 08/08/2018  Transportation Screening Complete Complete Complete  PCP or Specialist Appt within 3-5 Days - Complete Complete  HRI or [place] - Complete Complete  Social Work Consult for [place] Planning/Counseling - Complete Patient refused  Palliative Care Screening - Not Applicable Not Applicable  Medication Review Press photographer) Complete Complete Complete  PCP or Specialist appointment within 3-5 days of discharge Complete - -  [place] or Home Care Consult Complete - -  SW Recovery Care/Counseling Consult Complete - -  Palliative Care Screening Not Applicable - -  [place] Not Applicable - -

## 2019-08-09 NOTE — Progress Notes (Signed)
PHARMACY -  BRIEF ANTIBIOTIC NOTE   Pharmacy has received consult(s) for Vancomycin from an ED provider.  The patient's profile has been reviewed for ht/wt/allergies/indication/available labs.    One time order(s) placed for Vancomycin 2500 mg IV X 1  Further antibiotics/pharmacy consults should be ordered by admitting physician if indicated.                       Thank you, Verne Lanuza D 08/09/2019  4:16 AM

## 2019-08-09 NOTE — ED Notes (Signed)
Pt had urinated on himself. Pt cleaned up and placed in clean gown. Pt moved to hospital bed with chux pads and give warm blankets. Pt placed back on monitor and medications hooked back up. Pt resting comfortably and denies any issues at  This time.  Pt has open sores/wounds noted to bilateral lower extremities. Pt also has swelling, redness and some drainage.

## 2019-08-09 NOTE — Consult Note (Signed)
CARDIOLOGY CONSULT NOTE               Patient ID: [name] [name] MRN: [id number] DOB/AGE: 1960-03-20 59 y.o.  Admit date: 08/09/2019 Referring Physician Dr. Renae Gloss  Primary Physician [facility]  Primary Cardiologist Dr. Juliann Pares  Reason for Consultation Atrial fibrillation, CHF   HPI: [name] [name] is a 59 year old male with a past medical history significant for chronic atrial fibrillation, anticoagulated with warfarin, coronary artery disease, HFpEF, history of a DVT/PE while on Xarelto, history of a TBI, type 2 diabetes, COPD, obstructive sleep apnea, on BiPAP, and morbid obesity who presented to the ED on 08/09/19 for an acute onset of chest pain.  Workup in the ED has been significant for lactic acid of 3.6, ethanol level of 350, high sensitivity troponin of 49, ECG revealing atrial fibrillation with rapid ventricular response with a intraventricular conduction delay, and chest xray revealing vascular congestion and cardiomegaly with minimal interstitial edema.   It was recently the anniversary of his wife's death, and [name] [name] reports a few day history of excessive alcohol consumption, drinking 1/5 liquor a day. He also reports being off of his warfarin for the past several days as well as increased immobility.  Early this morning, [name] [name] experienced left sided chest discomfort, similar pain to what he experienced prior to his previous PE.  He has chronic lower extremity swelling and chronic, open wounds, that haven't been cared for in the past several days.   He is followed in outpatient cardiology by Dr. Juliann Pares.  Most recent echocardiogram through [facility] on 06/23/19 revealed normal RV and LV systolic function with an EF estimated between 55-60% with no evidence of regional wall abnormalities. Grade 1 diastolic dysfunction noted. No evidence of significant valvular abnormalities.   Review of systems complete and found to be negative unless listed above      Past Medical History:  Diagnosis Date  . Anxiety   . Arthritis   . Asthma   . Brain damage   . Chronic pain of both knees 07/13/2018  . Clotting disorder (HCC)   . COPD (chronic obstructive pulmonary disease) (HCC)   . Depression   . HFrEF (heart failure with reduced ejection fraction) (HCC)    a. 03/2018 Echo: EF 25-30%, diff HK. Mod LAE.  [name] History of DVT (deep vein thrombosis)   . History of pulmonary embolism    a. Chronic coumadin.  [name] Hypertension   . MI (myocardial infarction) (HCC)   . Morbid obesity (HCC)   . Neck pain 07/13/2018  . NICM (nonischemic cardiomyopathy) (HCC)    a. s/p Cath x 3 - reportedly nl cors. Last cath 2019 in [place]; b. a. 03/2018 Echo: EF 25-30%, diff HK.  [name] Persistent atrial fibrillation (HCC)    a. 03/2018 s/p DCCV; b. CHA2DS2VASc = 1-->Xarelto (later changed to warfarin); c. 05/2018 recurrent afib-->Amio initiated.  . Sleep apnea     Past Surgical History:  Procedure Laterality Date  . CARDIOVERSION N/A 03/24/2018   Procedure: CARDIOVERSION;  Surgeon: Antonieta Iba, MD;  Location: [facility] ORS;  Service: Cardiovascular;  Laterality: N/A;  . CARDIOVERSION N/A 08/08/2018   Procedure: CARDIOVERSION;  Surgeon: [name], MD;  Location: [facility] ORS;  Service: Cardiovascular;  Laterality: N/A;  . hearnia repair     X 3- total of two surgeries  . HERNIA REPAIR    . LEG SURGERY      (Not in a hospital admission)  Social History   Socioeconomic  History  . Marital status: Single    Spouse name: Not on file  . Number of children: Not on file  . Years of education: Not on file  . Highest education level: Not on file  Occupational History  . Not on file  Tobacco Use  . Smoking status: Never Smoker  . Smokeless tobacco: Never Used  Vaping Use  . Vaping Use: Never used  Substance and Sexual Activity  . Alcohol use: Not Currently  . Drug use: Never  . Sexual activity: Not Currently  Other Topics Concern  . Not on file  Social History  Narrative  . Not on file   Social Determinants of Health   Financial Resource Strain: Medium Risk  . Difficulty of Paying Living Expenses: Somewhat hard  Food Insecurity: No Food Insecurity  . Worried About Charity fundraiser in the Last Year: Never true  . Ran Out of Food in the Last Year: Never true  Transportation Needs: No Transportation Needs  . Lack of Transportation (Medical): No  . Lack of Transportation (Non-Medical): No  Physical Activity: Inactive  . Days of Exercise per Week: 0 days  . Minutes of Exercise per Session: 0 min  Stress: Stress Concern Present  . Feeling of Stress : Rather much  Social Connections: Socially Isolated  . Frequency of Communication with Friends and Family: Once a week  . Frequency of Social Gatherings with Friends and Family: Once a week  . Attends Religious Services: Never  . Active Member of Clubs or Organizations: No  . Attends Archivist Meetings: Never  . Marital Status: Widowed  Intimate Partner Violence:   . Fear of Current or Ex-Partner:   . Emotionally Abused:   [name] Physically Abused:   . Sexually Abused:     Family History  Problem Relation Age of Onset  . Heart failure Mother   . Lung cancer Mother   . Lung cancer Father   . Heart attack Maternal Grandmother   . Heart attack Maternal Grandfather       Review of systems complete and found to be negative unless listed above      PHYSICAL EXAM  General: Well developed, morbidly obese, in no acute distress HEENT:  Normocephalic and atramatic Neck:  No JVD.  Lungs: Clear bilaterally to auscultation and percussion. Heart: Irregularly irregular, rapid ventricular rate. Normal S1 and S2 without gallops or murmurs.  Abdomen: Bowel sounds are positive, abdomen soft and non-tender  Msk:  Back normal.  Normal strength and tone for age. Extremities: 2+ pitting edema in bilateral lower extremities with open wounds/ulcers are both lower extremities  Neuro: Alert and  oriented X 3. Psych:  Good affect, responds appropriately  Labs:   Lab Results  Component Value Date   WBC 6.9 08/09/2019   HGB 12.5 (L) 08/09/2019   HCT 39.0 08/09/2019   MCV 89.9 08/09/2019   PLT 238 08/09/2019    Recent Labs  Lab 08/09/19 0346  NA 136  K 3.7  CL 91*  CO2 28  BUN 12  CREATININE 1.03  CALCIUM 8.0*  PROT 8.2*  BILITOT 0.8  ALKPHOS 107  ALT 44  AST 77*  GLUCOSE 134*   Lab Results  Component Value Date   CKTOTAL 349 08/09/2019   TROPONINI 0.04 (HH) 08/07/2018    Lab Results  Component Value Date   CHOL 163 06/23/2019   CHOL 166 05/02/2019   CHOL 153 01/22/2019   Lab Results  Component Value Date  HDL 79 06/23/2019   HDL 70 05/02/2019   HDL 48 01/22/2019   Lab Results  Component Value Date   LDLCALC 65 06/23/2019   LDLCALC 75 05/02/2019   LDLCALC 80 01/22/2019   Lab Results  Component Value Date   TRIG 95 06/23/2019   TRIG 122 05/02/2019   TRIG 125 01/22/2019   Lab Results  Component Value Date   CHOLHDL 2.1 06/23/2019   CHOLHDL 3.2 01/22/2019   CHOLHDL 2.5 08/07/2018   No results found for: LDLDIRECT    Radiology: DG Ankle 2 Views Left  Result Date: 08/09/2019 CLINICAL DATA:  Acute on chronic wounds EXAM: LEFT ANKLE - 2 VIEW COMPARISON:  None. FINDINGS: Extensive severe edematous changes of the lower extremities with some benign-appearing soft tissue mineralization, likely sequela of chronic venous stasis. Atherosclerotic calcifications are present in the soft tissues. No radiographically evident site of focal ulceration is seen. No worrisome destructive, erosive, subcortical lucent or periostitis changes to suggest features of osteomyelitis. Mild to moderate degenerative changes present in the ankle, mid and hindfoot. Midfoot and hindfoot alignment is grossly preserved though incompletely assessed on nondedicated, nonweightbearing films. Remote posttraumatic deformity of the proximal tibial and distal fibular diaphyses.  Bidirectional calcaneal spurs. IMPRESSION: 1. Extensive severe edematous changes of the lower extremities with some benign-appearing soft tissue mineralization, likely sequela of chronic venous stasis. 2. No radiographically evident site of focal ulceration nor convincing radiographic evidence of acute osteomyelitis. If persisting concern, MRI is more sensitive and specific for early changes. 3. Degenerative changes as above. Electronically Signed   By: Kreg Shropshire M.D.   On: 08/09/2019 04:59   DG Ankle 2 Views Right  Result Date: 08/09/2019 CLINICAL DATA:  Acute on chronic wounds, concern for osteomyelitis EXAM: RIGHT ANKLE - 2 VIEW COMPARISON:  None. FINDINGS: Diffuse edematous changes of the lower extremity with extensive vascular calcifications. No discernible focal soft tissue ulceration. No destructive, erosive, subcortical lucent or periostitis changes to suggest a discernible site of acute osteomyelitis. Corticated os trigonum posterior to the talus. Os peroneum noted along the arch. Bidirectional calcaneal spurs. Moderate to severe arthrosis about the ankle with spurring along the medial and lateral malleoli. Mild to moderate degenerative changes in the mid and hindfoot. Midfoot and hindfoot alignment is grossly preserved though incompletely assessed on nonweightbearing films. IMPRESSION: 1. No discernible focal soft tissue ulceration nor convincing site radiographically evident osteomyelitis. If there is continued clinical concern, MRI is more sensitive and specific. 2. Diffuse edematous changes of the lower extremity with extensive vascular calcifications. 3. Degenerative changes, as above. Electronically Signed   By: Kreg Shropshire M.D.   On: 08/09/2019 04:52   CT Head Wo Contrast  Result Date: 07/10/2019 CLINICAL DATA:  Headaches and weakness on the left EXAM: CT HEAD WITHOUT CONTRAST TECHNIQUE: Contiguous axial images were obtained from the base of the skull through the vertex without  intravenous contrast. COMPARISON:  11/17/2018 FINDINGS: Brain: Mild atrophic changes are noted. Scattered chronic white matter ischemic changes seen. No acute hemorrhage, acute infarction or space-occupying mass lesion is seen. Vascular: No hyperdense vessel or unexpected calcification. Skull: Normal. Negative for fracture or focal lesion. Sinuses/Orbits: No acute finding. Other: None. IMPRESSION: Chronic atrophic and ischemic changes without acute abnormality. Electronically Signed   By: Alcide Clever M.D.   On: 07/10/2019 13:57   MR Brain Wo Contrast  Result Date: 07/10/2019 CLINICAL DATA:  Focal neuro deficit. Left-sided weakness. Morbid obesity. Headache EXAM: MRI HEAD WITHOUT CONTRAST TECHNIQUE: Multiplanar, multiecho pulse sequences  of the brain and surrounding structures were obtained without intravenous contrast. COMPARISON:  CT head 07/10/2019 FINDINGS: Brain: Generalized atrophy. Mild ventricular enlargement consistent with atrophy. Negative for acute infarct. Scattered small white matter hyperintensities compatible with mild chronic microvascular ischemic change. Negative for hemorrhage or mass. Brainstem and posterior fossa normal. Vascular: Normal arterial flow voids Skull and upper cervical spine: No focal skeletal lesion. Sinuses/Orbits: Mild mucosal edema paranasal sinuses. Mastoid clear bilaterally Negative orbit Other: None IMPRESSION: No acute abnormality. Mild atrophy and mild chronic microvascular ischemic change in the white matter. Electronically Signed   By: Marlan Palau M.D.   On: 07/10/2019 14:22   DG Chest Port 1 View  Result Date: 08/09/2019 CLINICAL DATA:  Chest pain, bright red blood per rectum EXAM: PORTABLE CHEST 1 VIEW COMPARISON:  Radiograph 06/22/2019, CT 03/04/2019 FINDINGS: Low lung volumes and central vascular congestion. Cardiomegaly is similar to prior. Hazy and interstitial opacities in the lungs without focal consolidation. No pneumothorax or visible effusion. No  acute osseous or soft tissue abnormality. Telemetry leads overlie the chest. IMPRESSION: Low volumes and atelectasis. Vascular congestion and cardiomegaly with at most minimal interstitial edema. Electronically Signed   By: Kreg Shropshire M.D.   On: 08/09/2019 04:49    EKG: Atrial fibrillation, rapid ventricular response at a rate of 109 bpm; intraventricular conduction delay   ASSESSMENT AND PLAN:  1.  Chest pain   -Chest CT ordered to rule out PE   -High sensitivity troponin elevated x 2, 49 and 56 respectively   2.  Atrial fibrillation   -Currently in atrial fibrillation with RVR; admits to consuming 1/5 liquor daily over the past few days   -Continue metoprolol 50mg  daily for rate control   -Reports poor medical compliance over the past few days, including being off of his warfarin   -Currently on heparin due to concerns of ACS; will resume warfarin pending current workup   3.  History of HFpEF   -Most recent echocardiogram revealing normal LV systolic function   -BNP normal, minimal edema on chest xray    The history, physical exam findings, and plan of care were all discussed with Dr. , and all decision making was made in collaboration.   Signed: Harold Hedge PA-C 08/09/2019, 1:49 PM

## 2019-08-09 NOTE — ED Notes (Signed)
Pt dressing is removed on bilateral legs, pt has dried blood and fluid on bandages that have saturated sites. Pt has open wounds noted on bilateral lower legs. Will redress following MD assessment

## 2019-08-09 NOTE — ED Notes (Signed)
Pt gone to CT 

## 2019-08-09 NOTE — Progress Notes (Signed)
ANTICOAGULATION CONSULT NOTE - Initial Consult  Pharmacy Consult for Heparin  Indication: chest pain/ACS  No Known Allergies  Patient Measurements: Height: 5\' 9"  (175.3 cm) Weight: (!) 195 kg (430 lb) IBW/kg (Calculated) : 70.7 Heparin Dosing Weight: 120.4 kg   Vital Signs: Temp: 98.3 F (36.8 C) (06/24 0320) Temp Source: Oral (06/24 0320) BP: 97/83 (06/24 0500) Pulse Rate: 109 (06/24 0500)  Labs: Recent Labs    08/09/19 0346  HGB 12.5*  HCT 39.0  PLT 238  APTT 37*  LABPROT 22.3*  INR 2.0*  CREATININE 1.03  CKTOTAL 349  TROPONINIHS 49*    Estimated Creatinine Clearance: 133.1 mL/min (by C-G formula based on SCr of 1.03 mg/dL).   Medical History: Past Medical History:  Diagnosis Date  . Anxiety   . Arthritis   . Asthma   . Brain damage   . Chronic pain of both knees 07/13/2018  . Clotting disorder (HCC)   . COPD (chronic obstructive pulmonary disease) (HCC)   . Depression   . HFrEF (heart failure with reduced ejection fraction) (HCC)    a. 03/2018 Echo: EF 25-30%, diff HK. Mod LAE.  04/2018 History of DVT (deep vein thrombosis)   . History of pulmonary embolism    a. Chronic coumadin.  [name] Hypertension   . MI (myocardial infarction) (HCC)   . Morbid obesity (HCC)   . Neck pain 07/13/2018  . NICM (nonischemic cardiomyopathy) (HCC)    a. s/p Cath x 3 - reportedly nl cors. Last cath 2019 in [place]; b. a. 03/2018 Echo: EF 25-30%, diff HK.  04/2018 Persistent atrial fibrillation (HCC)    a. 03/2018 s/p DCCV; b. CHA2DS2VASc = 1-->Xarelto (later changed to warfarin); c. 05/2018 recurrent afib-->Amio initiated.  . Sleep apnea     Medications:  (Not in a hospital admission)   Assessment: Pharmacy consulted to dose heparin in this 59 year old male admitted with ACS/NSTEMI. Pt was on warfarin PTA,  INR @ 0346 = 2.0.  CrCl = 133.1 ml/min   Goal of Therapy:  Heparin level 0.3-0.7 units/ml Monitor platelets by anticoagulation protocol: Yes   Plan:  Will order heparin 1600  units/hr ,  Will not bolus this pt per MD request. Will draw 1st HL 6 hrs after start of drip.   Javarian Jakubiak D 08/09/2019,5:24 AM

## 2019-08-09 NOTE — ED Notes (Signed)
Date and time results received: 08/09/19 0500 (use smartphrase ".now" to insert current time)  Test: Ethanol Critical Value: 350  Name of Provider Notified: York Cerise

## 2019-08-09 NOTE — ED Notes (Signed)
Medications sent up with pt and EDT

## 2019-08-09 NOTE — ED Provider Notes (Signed)
[facility] Emergency Department Provider Note  ____________________________________________   First MD Initiated Contact with Patient 08/09/19 [phone]     (approximate)  I have reviewed the triage vital signs and the nursing notes.   HISTORY  Chief Complaint Chest Pain  Level 5 caveat:  history/ROS limited by acute/critical illness as well as by probable acute intoxication.  HPI [name] [name] is a 59 y.o. male who presents by EMS for chest pain.  He has extensive chronic medical issues and reportedly most of the time has home health, but his home health nurse has been on vacation for at least a week.   According to the paramedic report, the patient has been seated in his chair at home for the last 2 to 3 days and not gotten up.  He sat himself close to the liquor cabinet and reports that he has been drinking a fifth of liquor a day.  He denies eating food or drinking anything else.  He said he has not had a bowel movement for 3 days with the last time he had a bowel movement it was bloody although he says he has been told in the past that he has a bleeding hemorrhoid.  He says that it is common for him to go many days without having a bowel movement.  He has atrial fibrillation but says he has not been taking his medications including his warfarin.    He says that he developed acute left-sided chest pain tonight and it is constant, sharp, and severe.  Nothing in particular makes it better or worse.  It is associated with some shortness of breath that is worse than his baseline shortness of breath.  He has chronic wounds on his legs and typically the home health nurse changes the dressings but she has not been there for about a week and he had very dirty and contaminated dressings on his legs that were removed upon his arrival.  The patient received a full dose aspirin prior to arrival given by EMS.        Past Medical History:  Diagnosis Date  . Anxiety   .  Arthritis   . Asthma   . Brain damage   . Chronic pain of both knees 07/13/2018  . Clotting disorder (HCC)   . COPD (chronic obstructive pulmonary disease) (HCC)   . Depression   . HFrEF (heart failure with reduced ejection fraction) (HCC)    a. 03/2018 Echo: EF 25-30%, diff HK. Mod LAE.  [name] History of DVT (deep vein thrombosis)   . History of pulmonary embolism    a. Chronic coumadin.  [name] Hypertension   . MI (myocardial infarction) (HCC)   . Morbid obesity (HCC)   . Neck pain 07/13/2018  . NICM (nonischemic cardiomyopathy) (HCC)    a. s/p Cath x 3 - reportedly nl cors. Last cath 2019 in [place]; b. a. 03/2018 Echo: EF 25-30%, diff HK.  [name] Persistent atrial fibrillation (HCC)    a. 03/2018 s/p DCCV; b. CHA2DS2VASc = 1-->Xarelto (later changed to warfarin); c. 05/2018 recurrent afib-->Amio initiated.  . Sleep apnea     Patient Active Problem List   Diagnosis Date Noted  . NSTEMI (non-ST elevated myocardial infarction) (HCC) 08/09/2019  . Lower extremity ulceration, unspecified laterality, with fat layer exposed (HCC) 08/09/2019  . Alcoholic intoxication without complication (HCC) 08/09/2019  . Pressure injury of skin 06/26/2019  . Cellulitis due to methicillin-resistant Staphylococcus aureus (MRSA) 06/23/2019  . Iron deficiency anemia 06/22/2019  .  Depression 06/22/2019  . Elevated troponin 06/22/2019  . Left-sided Bell's palsy 05/08/2019  . Slurred speech 05/03/2019  . Chronic intractable headache 05/03/2019  . Head injuries, initial encounter 05/03/2019  . Hand pain, left 05/03/2019  . Noncompliance by refusing service 05/03/2019  . Facial droop 05/02/2019  . Pharmacologic therapy 04/11/2019  . Disorder of skeletal system 04/11/2019  . Problems influencing health status 04/11/2019  . Chronic anticoagulation (warfarin  COUMADIN) 04/11/2019  . Hypocalcemia 04/11/2019  . Elevated sed rate 04/11/2019  . Elevated C-reactive protein (CRP) 04/11/2019  . Elevated hemoglobin A1c 04/11/2019   . Hypoalbuminemia 04/11/2019  . Edema due to hypoalbuminemia 04/11/2019  . Elevated brain natriuretic peptide (BNP) level 04/11/2019  . Chronic hip pain (Right) 04/11/2019  . Osteoarthritis of hip (Right) 04/11/2019  . Atrial fibrillation with RVR (HCC) 03/08/2019  . Degenerative joint disease of right hip 03/05/2019  . Hypothyroidism 03/04/2019  . Chronic venous stasis dermatitis of both lower extremities 03/04/2019  . Long term (current) use of anticoagulants 02/23/2019  . Cellulitis 01/22/2019  . PE (pulmonary thromboembolism) (HCC) 01/21/2019  . Open leg wound 01/21/2019  . HLD (hyperlipidemia) 01/21/2019  . CAD (coronary artery disease) 01/21/2019  . Cellulitis of both lower extremities 01/21/2019  . Noncompliance 01/09/2019  . Acquired thrombophilia (HCC) 11/28/2018  . Subtherapeutic international normalized ratio (INR) 11/11/2018  . Acute pulmonary embolism (HCC) 11/01/2018  . Depression, major, single episode, moderate (HCC) 09/17/2018  . Chest pain 08/06/2018  . Personal history of DVT (deep vein thrombosis) 07/13/2018  . History of pulmonary embolism (on Coumadin) 07/13/2018  . Chronic low back pain (Bilateral)  w/ sciatica (Bilateral) 07/13/2018  . TBI (traumatic brain injury) (HCC) 07/04/2018  . Anemia 06/27/2018  . Abnormal thyroid blood test 06/27/2018  . Diet-controlled diabetes mellitus (HCC) 06/06/2018  . HTN (hypertension) 06/06/2018  . Chronic gout involving toe, unspecified cause, unspecified laterality 06/06/2018  . Morbid obesity (HCC) 06/06/2018  . Chronic pain syndrome 06/06/2018  . Ulcers of both lower extremities, limited to breakdown of skin (HCC) 06/06/2018  . Gout 06/06/2018  . Persistent atrial fibrillation (HCC)   . Sepsis (HCC) 03/17/2018  . Chronic combined systolic and diastolic heart failure (HCC) 02/02/2018  . COPD (chronic obstructive pulmonary disease) (HCC) 02/02/2018  . Obstructive sleep apnea 02/02/2018  . Lymphedema 02/02/2018  .  Acute on chronic systolic CHF (congestive heart failure) (HCC) 02/02/2018  . Acute respiratory failure with hypoxia (HCC) 01/27/2018    Past Surgical History:  Procedure Laterality Date  . CARDIOVERSION N/A 03/24/2018   Procedure: CARDIOVERSION;  Surgeon: Antonieta Iba, MD;  Location: [facility] ORS;  Service: Cardiovascular;  Laterality: N/A;  . CARDIOVERSION N/A 08/08/2018   Procedure: CARDIOVERSION;  Surgeon: [name], MD;  Location: [facility] ORS;  Service: Cardiovascular;  Laterality: N/A;  . hearnia repair     X 3- total of two surgeries  . HERNIA REPAIR    . LEG SURGERY      Prior to Admission medications   Medication Sig Start Date End Date Taking? Authorizing Provider  albuterol (VENTOLIN HFA) 108 (90 Base) MCG/ACT inhaler Inhale 2 puffs into the lungs every 6 (six) hours as needed for wheezing or shortness of breath. 07/04/18  Yes Johnson, Megan P, DO  allopurinol (ZYLOPRIM) 100 MG tablet Take 1 tablet (100 mg total) by mouth daily. 05/08/19  Yes Flinchum, [name], FNP  atorvastatin (LIPITOR) 40 MG tablet Take 40 mg by mouth daily.   Yes [provider]  colchicine 0.6 MG tablet  1 tab PO q 12 hrs as needed until gout flare subsides, schedule on office follow up Patient taking differently: Take 0.6 mg by mouth every other day.  05/30/19  Yes Flinchum, [name], FNP  DULoxetine (CYMBALTA) 20 MG capsule Take 1 capsule (20 mg total) by mouth daily. 05/08/19  Yes Flinchum, [name], FNP  fluticasone (FLONASE) 50 MCG/ACT nasal spray Place 2 sprays into both nostrils 2 (two) times daily.   Yes [provider]  gabapentin (NEURONTIN) 300 MG capsule Take 1 capsule (300 mg total) by mouth 3 (three) times daily. 05/08/19 08/09/19 Yes Flinchum, [name], FNP  levothyroxine (SYNTHROID) 50 MCG tablet Take 1 tablet (50 mcg total) by mouth daily before breakfast. Need lab work beginning of June for Thyroid 05/08/19  Yes Flinchum, [name], FNP  lisinopril (ZESTRIL) 2.5 MG  tablet Take 1 tablet (2.5 mg total) by mouth daily. 05/08/19  Yes Flinchum, [name], FNP  loperamide (IMODIUM) 2 MG capsule Take 1 capsule (2 mg total) by mouth as needed for diarrhea or loose stools. 06/26/19  Yes Arnetha Courser, MD  meloxicam (MOBIC) 15 MG tablet Take 15 mg by mouth daily.   Yes [provider]  metoprolol succinate (TOPROL-XL) 50 MG 24 hr tablet Take 1 tablet (50 mg total) by mouth daily. Take with or immediately following a meal. 06/06/19  Yes End, Cristal Deer, MD  nitroGLYCERIN (NITROSTAT) 0.4 MG SL tablet Place 1 tablet under tongue every 5 minutes as needed for chest pain. (No more than 3 doses within 15 minutes) 03/08/19  Yes Flinchum, [name], FNP  torsemide (DEMADEX) 20 MG tablet Take 4 tablets (80 mg total) by mouth 2 (two) times daily. 06/26/19  Yes Arnetha Courser, MD  traZODone (DESYREL) 150 MG tablet TAKE 1 TABLET BY MOUTH AT BEDTIME AS NEEDED SLEEP 05/08/19  Yes Flinchum, [name], FNP  warfarin (COUMADIN) 5 MG tablet Take 1 tablet (5 mg total) by mouth daily. 06/26/19 06/25/20 Yes Arnetha Courser, MD  ALPRAZolam Prudy Feeler) 0.5 MG tablet Take 1 tablet (0.5 mg total) by mouth 2 (two) times daily as needed for anxiety or sleep. Patient not taking: Reported on 08/09/2019 06/26/19   Arnetha Courser, MD  ferrous sulfate 325 (65 FE) MG tablet Take 1 tablet (325 mg total) by mouth 2 (two) times daily with a meal. 05/08/19   Flinchum, [name], FNP  HYDROcodone-acetaminophen (NORCO/VICODIN) 5-325 MG tablet Take 1-2 tablets by mouth every 6 (six) hours as needed for moderate pain or severe pain. Patient not taking: Reported on 08/09/2019 06/26/19   Arnetha Courser, MD    Allergies Patient has no known allergies.  Family History  Problem Relation Age of Onset  . Heart failure Mother   . Lung cancer Mother   . Lung cancer Father   . Heart attack Maternal Grandmother   . Heart attack Maternal Grandfather     Social History Social History   Tobacco Use  . Smoking status:  Never Smoker  . Smokeless tobacco: Never Used  Vaping Use  . Vaping Use: Never used  Substance Use Topics  . Alcohol use: Not Currently  . Drug use: Never    Review of Systems Level 5 caveat:  history/ROS limited by acute/critical illness as well as by probable acute intoxication.  Patient reports chest pain or shortness of breath and has chronic wounds on his legs and chronic atrial fibrillation as well as alcohol abuse and has not been taking any of his medications.  He is also reporting  bloody stools most recently noted 3 days ago. ____________________________________________   PHYSICAL EXAM:  VITAL SIGNS: ED Triage Vitals  Enc Vitals Group     BP 08/09/19 0320 (!) 143/70     Pulse Rate 08/09/19 0320 (!) 115     Resp 08/09/19 0320 20     Temp 08/09/19 0320 98.3 F (36.8 C)     Temp Source 08/09/19 0320 Oral     SpO2 08/09/19 0320 97 %     Weight 08/09/19 0322 (!) 195 kg (430 lb)     Height 08/09/19 0322 1.753 m (5\' 9" )     Head Circumference --      Peak Flow --      Pain Score 08/09/19 0321 8     Pain Loc --      Pain Edu? --      Excl. in GC? --     Constitutional: Alert and oriented.  Appears intoxicated based on slurring of his speech but this may be baseline. Eyes: Conjunctivae are normal.  Head: Atraumatic. Nose: No congestion/rhinnorhea. Mouth/Throat: Patient is wearing a mask. Neck: No stridor.  No meningeal signs.   Cardiovascular: Tachycardia with irregularly irregular rhythm. Good peripheral circulation. Grossly normal heart sounds but limited by body habitus. Respiratory: Increased respiratory effort with some intercostal muscle retractions and accessory muscle usage but with clear but diminished breath sounds, likely limited by body habitus. Gastrointestinal: Morbid obesity.  Soft and nontender. No distention. Patient refuses rectal exam. Musculoskeletal: At least 1+ pitting edema in bilateral lower extremities with chronic deformities from about the mid  calf down to the feet.  The patient reports that he has had lower leg fractures in the past.  He has 2+ pitting edema of the feet.  Severe chronic and acute wounds of the lower extremities as documented in the skin section below. Neurologic:  Normal speech and language. No gross focal neurologic deficits are appreciated.  Psychiatric: Mood and affect are somewhat distant and irritable. Skin:  Skin is warm and dry.  In addition to the peripheral edema in both legs, he has wounds on bilateral lower extremities that appear both acute and chronic.  Photos are included below but failed to capture the extent of the wounds.  The total diameter of the wounds extend well beyond 10 cm and essentially extend from the top of the foot to the middle of the calf on both sides.  The wounds are circumferential, worse on the left than on right.        ____________________________________________   LABS (all labs ordered are listed, but only abnormal results are displayed)  Labs Reviewed  COMPREHENSIVE METABOLIC PANEL - Abnormal; Notable for the following components:      Result Value   Chloride 91 (*)    Glucose, Bld 134 (*)    Calcium 8.0 (*)    Total Protein 8.2 (*)    AST 77 (*)    Anion gap 17 (*)    All other components within normal limits  CBC WITH DIFFERENTIAL/PLATELET - Abnormal; Notable for the following components:   Hemoglobin 12.5 (*)    RDW 18.6 (*)    All other components within normal limits  PROTIME-INR - Abnormal; Notable for the following components:   Prothrombin Time 22.3 (*)    INR 2.0 (*)    All other components within normal limits  AMMONIA - Abnormal; Notable for the following components:   Ammonia <9 (*)    All other components within normal limits  LACTIC ACID, PLASMA - Abnormal; Notable for the following components:   Lactic Acid, Venous 3.6 (*)    All other components within normal limits  APTT - Abnormal; Notable for the following components:   aPTT 37 (*)    All  other components within normal limits  ETHANOL - Abnormal; Notable for the following components:   Alcohol, Ethyl (B) 350 (*)    All other components within normal limits  LACTIC ACID, PLASMA - Abnormal; Notable for the following components:   Lactic Acid, Venous 3.5 (*)    All other components within normal limits  BLOOD GAS, VENOUS - Abnormal; Notable for the following components:   Bicarbonate 33.0 (*)    Acid-Base Excess 6.2 (*)    All other components within normal limits  PHOSPHORUS - Abnormal; Notable for the following components:   Phosphorus 2.0 (*)    All other components within normal limits  TROPONIN I (HIGH SENSITIVITY) - Abnormal; Notable for the following components:   Troponin I (High Sensitivity) 49 (*)    All other components within normal limits  SARS CORONAVIRUS 2 BY RT PCR (HOSPITAL ORDER, PERFORMED IN [facility])  CULTURE, BLOOD (ROUTINE X 2)  CULTURE, BLOOD (ROUTINE X 2)  LIPASE, BLOOD  PROCALCITONIN  BRAIN NATRIURETIC PEPTIDE  CK  BETA-HYDROXYBUTYRIC ACID  MAGNESIUM  LACTIC ACID, PLASMA  URINALYSIS, COMPLETE (UACMP) WITH MICROSCOPIC  MAGNESIUM  HEPARIN LEVEL (UNFRACTIONATED)  TYPE AND SCREEN   ____________________________________________  EKG  ED ECG REPORT I, Loleta Rose, the attending physician, personally viewed and interpreted this ECG.  Date: 08/09/2019 EKG Time: 3:19 AM Rate: 109 Rhythm: Atrial fibrillation QRS Axis: normal Intervals: Abnormal secondary to A. fib and nonspecific intraventricular conduction delay ST/T Wave abnormalities: Non-specific ST segment / T-wave changes, but no clear evidence of acute ischemia. Narrative Interpretation: no definitive evidence of acute ischemia; does not meet STEMI criteria.   ____________________________________________  RADIOLOGY I, Loleta Rose, personally viewed and evaluated these images (plain radiographs) as part of my medical decision making, as well as reviewing the  written report by the radiologist.  ED MD interpretation: No indication of osteomyelitis on lower extremity x-rays.  Probable mild vascular congestion on chest x-ray without lobar pneumonia or frank pulmonary edema.  Official radiology report(s): DG Ankle 2 Views Left  Result Date: 08/09/2019 CLINICAL DATA:  Acute on chronic wounds EXAM: LEFT ANKLE - 2 VIEW COMPARISON:  None. FINDINGS: Extensive severe edematous changes of the lower extremities with some benign-appearing soft tissue mineralization, likely sequela of chronic venous stasis. Atherosclerotic calcifications are present in the soft tissues. No radiographically evident site of focal ulceration is seen. No worrisome destructive, erosive, subcortical lucent or periostitis changes to suggest features of osteomyelitis. Mild to moderate degenerative changes present in the ankle, mid and hindfoot. Midfoot and hindfoot alignment is grossly preserved though incompletely assessed on nondedicated, nonweightbearing films. Remote posttraumatic deformity of the proximal tibial and distal fibular diaphyses. Bidirectional calcaneal spurs. IMPRESSION: 1. Extensive severe edematous changes of the lower extremities with some benign-appearing soft tissue mineralization, likely sequela of chronic venous stasis. 2. No radiographically evident site of focal ulceration nor convincing radiographic evidence of acute osteomyelitis. If persisting concern, MRI is more sensitive and specific for early changes. 3. Degenerative changes as above. Electronically Signed   By: Kreg Shropshire M.D.   On: 08/09/2019 04:59   DG Ankle 2 Views Right  Result Date: 08/09/2019 CLINICAL DATA:  Acute on chronic wounds, concern for osteomyelitis EXAM: RIGHT ANKLE -  2 VIEW COMPARISON:  None. FINDINGS: Diffuse edematous changes of the lower extremity with extensive vascular calcifications. No discernible focal soft tissue ulceration. No destructive, erosive, subcortical lucent or periostitis  changes to suggest a discernible site of acute osteomyelitis. Corticated os trigonum posterior to the talus. Os peroneum noted along the arch. Bidirectional calcaneal spurs. Moderate to severe arthrosis about the ankle with spurring along the medial and lateral malleoli. Mild to moderate degenerative changes in the mid and hindfoot. Midfoot and hindfoot alignment is grossly preserved though incompletely assessed on nonweightbearing films. IMPRESSION: 1. No discernible focal soft tissue ulceration nor convincing site radiographically evident osteomyelitis. If there is continued clinical concern, MRI is more sensitive and specific. 2. Diffuse edematous changes of the lower extremity with extensive vascular calcifications. 3. Degenerative changes, as above. Electronically Signed   By: Lovena Le M.D.   On: 08/09/2019 04:52   DG Chest Port 1 View  Result Date: 08/09/2019 CLINICAL DATA:  Chest pain, bright red blood per rectum EXAM: PORTABLE CHEST 1 VIEW COMPARISON:  Radiograph 06/22/2019, CT 03/04/2019 FINDINGS: Low lung volumes and central vascular congestion. Cardiomegaly is similar to prior. Hazy and interstitial opacities in the lungs without focal consolidation. No pneumothorax or visible effusion. No acute osseous or soft tissue abnormality. Telemetry leads overlie the chest. IMPRESSION: Low volumes and atelectasis. Vascular congestion and cardiomegaly with at most minimal interstitial edema. Electronically Signed   By: Lovena Le M.D.   On: 08/09/2019 04:49    ____________________________________________   PROCEDURES   Procedure(s) performed (including Critical Care):  .Critical Care Performed by: Hinda Kehr, MD Authorized by: Hinda Kehr, MD   Critical care provider statement:    Critical care time (minutes):  45   Critical care time was exclusive of:  Separately billable procedures and treating other patients   Critical care was necessary to treat or prevent imminent or  life-threatening deterioration of the following conditions:  Sepsis   Critical care was time spent personally by me on the following activities:  Development of treatment plan with patient or surrogate, discussions with consultants, evaluation of patient's response to treatment, examination of patient, obtaining history from patient or surrogate, ordering and performing treatments and interventions, ordering and review of laboratory studies, ordering and review of radiographic studies, pulse oximetry, re-evaluation of patient's condition and review of old charts  .1-3 Lead EKG Interpretation Performed by: Hinda Kehr, MD Authorized by: Hinda Kehr, MD     Interpretation: abnormal     ECG rate:  115   ECG rate assessment: tachycardic     Rhythm: sinus tachycardia     Ectopy: none     Conduction: normal       ____________________________________________   INITIAL IMPRESSION / MDM / ASSESSMENT AND PLAN / ED COURSE  As part of my medical decision making, I reviewed the following data within the [facility] notes reviewed and incorporated, Labs reviewed , EKG interpreted , Old chart reviewed, Radiograph reviewed , Discussed with admitting physician  and Notes from prior ED visits   Differential diagnosis includes, but is not limited to, sepsis, wound infection, cellulitis, bacteremia, pneumonia, UTI, electrolyte or metabolic abnormality, rhabdomyolysis.  The patient has what appears to be rather severe wound infections on both of his lower extremities although it is clear that these are chronic wounds but they do appear acutely infected.  He is tachycardic and tachypneic.  He is also alcoholic reports he has not been eating or drinking anything other than liquor  for the last 3 or 4 days.  I will initiate a broad evaluation but anticipate treatment with fluids and antibiotics.  The patient is on the cardiac monitor to evaluate for evidence of arrhythmia and/or  significant heart rate changes.     Clinical Course as of Aug 08 752  Thu Aug 09, 2019  0409 Initially I erroneously put in the code sepsis order when patient first arrived in an attempt to order broad lab work.  However after I physically evaluated the patient given his persistent heart rate and the open wounds on his legs, I again ordered code sepsis and this time I also ordered empiric antibiotics for purulent cellulitis which include ceftriaxone 2 g IV and vancomycin per pharmacy protocol.  The patient is morbidly obese and I have ordered a total of 2.2 L of normal saline which is 30 mL/kg of ideal body weight.  First lactic acid and procalcitonin are pending.   [CF]  0435 No evidence of renal failure.  Very mild elevation of AST.  Anion gap is 17 and the glucose is 134.  Could indicate some degree of alcoholic ketoacidosis but mild.  Comprehensive metabolic panel(!) [CF]  [id number] Consistent with probable sepsis, continue with protocol as previously described  Lactic Acid, Venous(!!): 3.6 [CF]  0438 Added on beta hydroxybutyric acid as well as magnesium and phosphorus levels given the possibility of alcoholic ketoacidosis.  Also ordered a VBG.   [CF]  0441 Within normal limits, no evidence of rhabdomyolysis  CK Total: 349 [CF]  0502 Alcohol, Ethyl (B)(!!): 350 [CF]  0503 Blood pressure downtrending slightly as is frequently the case after antibiotic treatment in sepsis, but MAP still >65.   [CF]  0504 CXR with vascular congestion, however imaging is limited by body habitus.  Patient still needs IV fluids per sepsis protocol and has a preserved EF as per last ECHO in system.    DG Chest Port 1 View [CF]  0505 Mildly elevated troponin.  In the setting of ongoing chest pain and a-fib with an INR of 2.0, I will initiate heparin therapy without bolus.    Troponin I (High Sensitivity)(!): 49 [CF]  0505 consulting hospitalist for admission   [CF]  0507 Phosphorus(!): 2.0 [CF]  0512 Of note, I  encourage the patient to allow me to perform a rectal exam to evaluate his report of bloody stool, but he adamantly refuses a rectal exam at this time.  He said that he knows he has a bleeding hemorrhoid and does not need an exam.   [CF]  0520 Discussed case in person with Dr. Para March with the hospitalist service.  She will admit.   [CF]    Clinical Course User Index [CF] Loleta Rose, MD     ____________________________________________  FINAL CLINICAL IMPRESSION(S) / ED DIAGNOSES  Final diagnoses:  Sepsis, due to unspecified organism, unspecified whether acute organ dysfunction present (HCC)  Wound infection  Alcoholic intoxication with complication (HCC)  Alcohol abuse  Chest pain, unspecified type  Chronic pain syndrome  Morbid obesity (HCC)  Elevated troponin level     MEDICATIONS GIVEN DURING THIS VISIT:  Medications  vancomycin (VANCOREADY) IVPB 2000 mg/400 mL (2,000 mg Intravenous New Bag/Given 08/09/19 0451)    Followed by  vancomycin (VANCOREADY) IVPB 500 mg/100 mL (has no administration in time range)  heparin ADULT infusion 100 units/mL (25000 units/260mL sodium chloride 0.45%) (1,600 Units/hr Intravenous New Bag/Given 08/09/19 0630)  LORazepam (ATIVAN) tablet 1-4 mg (has no administration in time range)  Or  LORazepam (ATIVAN) injection 1-4 mg (has no administration in time range)  thiamine tablet 100 mg (has no administration in time range)    Or  thiamine (B-1) injection 100 mg (has no administration in time range)  folic acid (FOLVITE) tablet 1 mg (has no administration in time range)  multivitamin with minerals tablet 1 tablet (has no administration in time range)  aspirin EC tablet 81 mg (has no administration in time range)  nitroGLYCERIN (NITROSTAT) SL tablet 0.4 mg (has no administration in time range)  acetaminophen (TYLENOL) tablet 650 mg (has no administration in time range)  ondansetron (ZOFRAN) injection 4 mg (has no administration in time range)   LORazepam (ATIVAN) injection 0-4 mg (has no administration in time range)    Followed by  LORazepam (ATIVAN) injection 0-4 mg (has no administration in time range)  metoprolol succinate (TOPROL-XL) 24 hr tablet 50 mg (has no administration in time range)  lisinopril (ZESTRIL) tablet 2.5 mg (has no administration in time range)  atorvastatin (LIPITOR) tablet 40 mg (has no administration in time range)  insulin aspart (novoLOG) injection 0-20 Units (has no administration in time range)  insulin aspart (novoLOG) injection 0-5 Units (has no administration in time range)  metroNIDAZOLE (FLAGYL) IVPB 500 mg (has no administration in time range)  ceFEPIme (MAXIPIME) 2 g in sodium chloride 0.9 % 100 mL IVPB (has no administration in time range)  thiamine (B-1) injection 100 mg (100 mg Intravenous Given 08/09/19 0404)  sodium chloride 0.9 % bolus 1,000 mL (0 mLs Intravenous Stopped 08/09/19 0615)  lactated ringers bolus 1,000 mL (0 mLs Intravenous Stopped 08/09/19 0614)    And  lactated ringers bolus 200 mL (0 mLs Intravenous Stopped 08/09/19 0618)  cefTRIAXone (ROCEPHIN) 2 g in sodium chloride 0.9 % 100 mL IVPB (0 g Intravenous Stopped 08/09/19 0449)     ED Discharge Orders    None      *Please note:  Ran Tullis was evaluated in Emergency Department on 08/09/2019 for the symptoms described in the history of present illness. He was evaluated in the context of the global COVID-19 pandemic, which necessitated consideration that the patient might be at risk for infection with the SARS-CoV-2 virus that causes COVID-19. Institutional protocols and algorithms that pertain to the evaluation of patients at risk for COVID-19 are in a state of rapid change based on information released by regulatory bodies including the CDC and federal and state organizations. These policies and algorithms were followed during the patient's care in the ED.  Some ED evaluations and interventions may be delayed as a result of  limited staffing during and after the pandemic.*  Note:  This document was prepared using Dragon voice recognition software and may include unintentional dictation errors.   Loleta Rose, MD 08/09/19 [phone]

## 2019-08-09 NOTE — Progress Notes (Signed)
Pt found in recliner with IV line pulling tight, bed soiled and external catheter pulled off.Pt visibly shaking hard and pt trying to eat but can't seem to manage with the shaking.  Pt instructed that I just recently gave him Ativan, to relax and let it take effect.  Pt also given his call bell and told to call when he needs to go back to bed, not to get up again alone.  Pt no comment.

## 2019-08-09 NOTE — Progress Notes (Signed)
Notified bedside nurse of need to draw lactic acid.  

## 2019-08-09 NOTE — ED Notes (Signed)
Instructed to leave legs open to air at this time per Dr. York Cerise

## 2019-08-09 NOTE — Progress Notes (Addendum)
ANTICOAGULATION CONSULT NOTE - Initial Consult  Pharmacy Consult for Heparin  Indication: chest pain/ACS  No Known Allergies  Patient Measurements: Height: 5\' 9"  (175.3 cm) Weight: (!) 195 kg (430 lb) IBW/kg (Calculated) : 70.7 Heparin Dosing Weight: 120.4 kg   Vital Signs: Temp: 98.3 F (36.8 C) (06/24 0320) Temp Source: Oral (06/24 0320) BP: 118/62 (06/24 1250) Pulse Rate: 108 (06/24 1250)  Labs: Recent Labs    08/09/19 0346 08/09/19 1254  HGB 12.5*  --   HCT 39.0  --   PLT 238  --   APTT 37*  --   LABPROT 22.3*  --   INR 2.0*  --   HEPARINUNFRC  --  0.23*  CREATININE 1.03  --   CKTOTAL 349  --   TROPONINIHS 49*  --     Estimated Creatinine Clearance: 133.1 mL/min (by C-G formula based on SCr of 1.03 mg/dL).   Medical History: Past Medical History:  Diagnosis Date  . Anxiety   . Arthritis   . Asthma   . Brain damage   . Chronic pain of both knees 07/13/2018  . Clotting disorder (HCC)   . COPD (chronic obstructive pulmonary disease) (HCC)   . Depression   . HFrEF (heart failure with reduced ejection fraction) (HCC)    a. 03/2018 Echo: EF 25-30%, diff HK. Mod LAE.  04/2018 History of DVT (deep vein thrombosis)   . History of pulmonary embolism    a. Chronic coumadin.  [name] Hypertension   . MI (myocardial infarction) (HCC)   . Morbid obesity (HCC)   . Neck pain 07/13/2018  . NICM (nonischemic cardiomyopathy) (HCC)    a. s/p Cath x 3 - reportedly nl cors. Last cath 2019 in [place]; b. a. 03/2018 Echo: EF 25-30%, diff HK.  04/2018 Persistent atrial fibrillation (HCC)    a. 03/2018 s/p DCCV; b. CHA2DS2VASc = 1-->Xarelto (later changed to warfarin); c. 05/2018 recurrent afib-->Amio initiated.  . Sleep apnea     Medications:  (Not in a hospital admission)   Assessment: Pharmacy consulted to dose heparin in this 59 year old male admitted with ACS/NSTEMI. Pt was on warfarin PTA,  INR @ 0346 = 2.0.  CrCl = 133.1 ml/min   6/24 Heparin infusion started @ 1600 units/hr   Goal  of Therapy:  Heparin level 0.3-0.7 units/ml Monitor platelets by anticoagulation protocol: Yes   Plan:  6/24 @ 1254 HL: 0.23- Level is subtherapeutic.  Heparin infusion has been DCd by provider.   Pharmacy to sign off at this time.    7/24, PharmD, BCPS Clinical Pharmacist 08/09/2019 1:31 PM

## 2019-08-09 NOTE — Progress Notes (Signed)
Patient ID: [name] [name], male   DOB: 1960-03-28, 59 y.o.   MRN: [id number] [facility] PROGRESS NOTE  [name] [name] QQV:[id number] DOB: [birth year] DOA: 08/09/2019 PCP: [name] Beam, FNP  HPI/Subjective: Patient coming in with bilateral leg pain and leg ulcerations.  He says the leg ulcerations have been going on for a while and has had redness for a while on his legs but now worsening pain.  He states he drinks alcohol to help out with the pain.  Currently asking for some pain medications.  Has BiPAP at home at night.  He was resting on the BiPAP when I saw him.  Objective: Vitals:   08/09/19 1114 08/09/19 1250  BP:  118/62  Pulse: (!) 117 (!) 108  Resp: 19   Temp:    SpO2: 99%     Intake/Output Summary (Last 24 hours) at 08/09/2019 1323 Last data filed at 08/09/2019 1209 Gross per 24 hour  Intake --  Output 200 ml  Net -200 ml   Filed Weights   08/09/19 0322  Weight: (!) 195 kg    ROS: Review of Systems  Respiratory: Positive for shortness of breath. Negative for cough.   Cardiovascular: Negative for chest pain.  Gastrointestinal: Negative for abdominal pain, nausea and vomiting.  Musculoskeletal: Positive for joint pain.   Exam: Physical Exam  Constitutional: He is oriented to person, place, and time.  HENT:  Nose: No mucosal edema.  Mouth/Throat: No oropharyngeal exudate.  Eyes: Pupils are equal, round, and reactive to light. Conjunctivae and lids are normal.  Cardiovascular: S1 normal and S2 normal. Exam reveals no gallop.  No murmur heard. Respiratory: No respiratory distress. He has decreased breath sounds in the right lower field and the left lower field. He has no wheezes. He has rhonchi in the right lower field and the left lower field. He has no rales.  GI: Soft. Bowel sounds are normal. There is abdominal tenderness in the epigastric area.  Musculoskeletal:     Right ankle: Swelling present.     Left ankle: Swelling present.  Neurological:  He is alert and oriented to person, place, and time. No cranial nerve deficit.  Skin: Skin is warm. Nails show no clubbing.  Bilateral leg ulcerations on lower extremities with surrounding erythema.  Pain to palpation.      Data Reviewed: Basic Metabolic Panel: Recent Labs  Lab 08/09/19 0346 08/09/19 0914  NA 136  --   K 3.7  --   CL 91*  --   CO2 28  --   GLUCOSE 134*  --   BUN 12  --   CREATININE 1.03  --   CALCIUM 8.0*  --   MG 2.2 2.1  PHOS 2.0*  --    Liver Function Tests: Recent Labs  Lab 08/09/19 0346  AST 77*  ALT 44  ALKPHOS 107  BILITOT 0.8  PROT 8.2*  ALBUMIN 4.0   Recent Labs  Lab 08/09/19 0346  LIPASE 41   Recent Labs  Lab 08/09/19 0346  AMMONIA <9*   CBC: Recent Labs  Lab 08/09/19 0346  WBC 6.9  NEUTROABS 4.5  HGB 12.5*  HCT 39.0  MCV 89.9  PLT 238   Cardiac Enzymes: Recent Labs  Lab 08/09/19 0346  CKTOTAL 349   BNP (last 3 results) Recent Labs    05/02/19 1546 06/22/19 0852 08/09/19 0346  BNP 44.1 118.0* 67.1    ProBNP (last 3 results) No results for input(s): PROBNP in the last 8760 hours.  CBG: Recent Labs  Lab 08/09/19 1112  GLUCAP 107*    Recent Results (from the past 240 hour(s))  SARS Coronavirus 2 by RT PCR (hospital order, performed in [facility] hospital lab) Nasopharyngeal Nasopharyngeal Swab     Status: None   Collection Time: 08/09/19  3:46 AM   Specimen: Nasopharyngeal Swab  Result Value Ref Range Status   SARS Coronavirus 2 NEGATIVE NEGATIVE Final    Comment: (NOTE) SARS-CoV-2 target nucleic acids are NOT DETECTED.  The SARS-CoV-2 RNA is generally detectable in upper and lower respiratory specimens during the acute phase of infection. The lowest concentration of SARS-CoV-2 viral copies this assay can detect is 250 copies / mL. A negative result does not preclude SARS-CoV-2 infection and should not be used as the sole basis for treatment or other patient management decisions.  A negative result  may occur with improper specimen collection / handling, submission of specimen other than nasopharyngeal swab, presence of viral mutation(s) within the areas targeted by this assay, and inadequate number of viral copies (<250 copies / mL). A negative result must be combined with clinical observations, patient history, and epidemiological information.  Fact Sheet for Patients:   BoilerBrush.com.cy  Fact Sheet for Healthcare Providers: https://pope.com/  This test is not yet approved or  cleared by the [place] FDA and has been authorized for detection and/or diagnosis of SARS-CoV-2 by FDA under an Emergency Use Authorization (EUA).  This EUA will remain in effect (meaning this test can be used) for the duration of the COVID-19 declaration under Section 564(b)(1) of the Act, 21 U.S.C. section 360bbb-3(b)(1), unless the authorization is terminated or revoked sooner.  Performed at [facility], [address] [place], [place] [address]   Blood Culture (routine x 2)     Status: None (Preliminary result)   Collection Time: 08/09/19  3:46 AM   Specimen: BLOOD  Result Value Ref Range Status   Specimen Description BLOOD RIGHT [facility]  Final   Special Requests   Final    BOTTLES DRAWN AEROBIC AND ANAEROBIC Blood Culture adequate volume   Culture   Final    NO GROWTH < 12 HOURS Performed at [facility], [address], [place], [place] [address]    Report Status PENDING  Incomplete  Blood Culture (routine x 2)     Status: None (Preliminary result)   Collection Time: 08/09/19  3:46 AM   Specimen: BLOOD  Result Value Ref Range Status   Specimen Description BLOOD LEFT HAND  Final   Special Requests   Final    BOTTLES DRAWN AEROBIC AND ANAEROBIC Blood Culture results may not be optimal due to an excessive volume of blood received in culture bottles   Culture   Final    NO GROWTH < 12 HOURS Performed at [facility], [address] [place], [place] [address]    Report Status PENDING  Incomplete     Studies: DG Ankle 2 Views Left  Result Date: 08/09/2019 CLINICAL DATA:  Acute on chronic wounds EXAM: LEFT ANKLE - 2 VIEW COMPARISON:  None. FINDINGS: Extensive severe edematous changes of the lower extremities with some benign-appearing soft tissue mineralization, likely sequela of chronic venous stasis. Atherosclerotic calcifications are present in the soft tissues. No radiographically evident site of focal ulceration is seen. No worrisome destructive, erosive, subcortical lucent or periostitis changes to suggest features of osteomyelitis. Mild to moderate degenerative changes present in the ankle, mid and hindfoot. Midfoot and hindfoot alignment is grossly  preserved though incompletely assessed on nondedicated, nonweightbearing films. Remote posttraumatic deformity of the proximal tibial and distal fibular diaphyses. Bidirectional calcaneal spurs. IMPRESSION: 1. Extensive severe edematous changes of the lower extremities with some benign-appearing soft tissue mineralization, likely sequela of chronic venous stasis. 2. No radiographically evident site of focal ulceration nor convincing radiographic evidence of acute osteomyelitis. If persisting concern, MRI is more sensitive and specific for early changes. 3. Degenerative changes as above. Electronically Signed   By: Kreg Shropshire M.D.   On: 08/09/2019 04:59   DG Ankle 2 Views Right  Result Date: 08/09/2019 CLINICAL DATA:  Acute on chronic wounds, concern for osteomyelitis EXAM: RIGHT ANKLE - 2 VIEW COMPARISON:  None. FINDINGS: Diffuse edematous changes of the lower extremity with extensive vascular calcifications. No discernible focal soft tissue ulceration. No destructive, erosive, subcortical lucent or periostitis changes to suggest a discernible site of acute osteomyelitis. Corticated os trigonum posterior to the talus. Os peroneum noted along the  arch. Bidirectional calcaneal spurs. Moderate to severe arthrosis about the ankle with spurring along the medial and lateral malleoli. Mild to moderate degenerative changes in the mid and hindfoot. Midfoot and hindfoot alignment is grossly preserved though incompletely assessed on nonweightbearing films. IMPRESSION: 1. No discernible focal soft tissue ulceration nor convincing site radiographically evident osteomyelitis. If there is continued clinical concern, MRI is more sensitive and specific. 2. Diffuse edematous changes of the lower extremity with extensive vascular calcifications. 3. Degenerative changes, as above. Electronically Signed   By: Kreg Shropshire M.D.   On: 08/09/2019 04:52   DG Chest Port 1 View  Result Date: 08/09/2019 CLINICAL DATA:  Chest pain, bright red blood per rectum EXAM: PORTABLE CHEST 1 VIEW COMPARISON:  Radiograph 06/22/2019, CT 03/04/2019 FINDINGS: Low lung volumes and central vascular congestion. Cardiomegaly is similar to prior. Hazy and interstitial opacities in the lungs without focal consolidation. No pneumothorax or visible effusion. No acute osseous or soft tissue abnormality. Telemetry leads overlie the chest. IMPRESSION: Low volumes and atelectasis. Vascular congestion and cardiomegaly with at most minimal interstitial edema. Electronically Signed   By: Kreg Shropshire M.D.   On: 08/09/2019 04:49    Scheduled Meds: . [START ON 08/10/2019] aspirin EC  81 mg Oral Daily  . atorvastatin  40 mg Oral Daily  . folic acid  1 mg Oral Daily  . furosemide  40 mg Intravenous Daily  . insulin aspart  0-20 Units Subcutaneous TID WC  . insulin aspart  0-5 Units Subcutaneous QHS  . lisinopril  2.5 mg Oral Daily  . LORazepam  0-4 mg Intravenous Q6H   Followed by  . [START ON 08/11/2019] LORazepam  0-4 mg Intravenous Q12H  . metoprolol succinate  50 mg Oral Daily  . multivitamin with minerals  1 tablet Oral Daily  . thiamine  100 mg Oral Daily   Or  . thiamine  100 mg Intravenous  Daily   Continuous Infusions: . ceFEPime (MAXIPIME) IV Stopped (08/09/19 1058)  . vancomycin      Assessment/Plan:  1. Clinical sepsis present on admission with bilateral lower extremity ulcerations with surrounding erythema consistent with cellulitis.  Patient placed on Maxipime and vancomycin.  We will get wound culture. 2. Acute on chronic diastolic congestive heart failure.  Will start Lasix 40 mg IV daily.  Already on metoprolol. 3. Elevated troponin likely demand ischemia from sepsis and heart failure.  Second troponin never sent I will send off a second troponin.  Patient already on aspirin, atorvastatin and beta-blocker.  Case discussed with cardiology to see in consultation. 4. Obstructive sleep apnea on BiPAP 5. COPD.  No acute exacerbation at this time continue to monitor 6. Essential hypertension on metoprolol 7. Chronic atrial fibrillation on Coumadin will have pharmacy dose metoprolol for rate control. 8. Alcohol abuse on alcohol withdrawal protocol 9. Physical therapy evaluation  Code Status:     Code Status Orders  (From admission, onward)         Start     Ordered   08/09/19 0615  Full code  Continuous        08/09/19 0621        Code Status History    Date Active Date Inactive Code Status Order ID Comments User Context   06/22/2019 1531 06/26/2019 1742 Full Code [id number]  Lorretta Harp, MD Inpatient   03/04/2019 2314 03/05/2019 2105 Full Code [id number]  Anselm Jungling, DO ED   10/24/2018 0420 10/28/2018 1849 Full Code [id number]  Oralia Manis, MD Inpatient   08/06/2018 1815 08/10/2018 2042 Full Code [id number]  Alford Highland, MD ED   07/18/2018 0716 07/23/2018 1827 Full Code [id number]  Arnaldo Natal, MD ED   05/29/2018 1453 06/01/2018 1931 Full Code [id number]  Salary, Evelena Asa, MD Inpatient   03/17/2018 0042 03/25/2018 1554 Full Code [id number]  Altamese Dilling, MD ED   02/24/2018 0725 02/27/2018 1555 Full Code [id number]  Oralia Manis, MD Inpatient   01/27/2018 1345  01/29/2018 1406 Full Code [id number]  Alford Highland, MD ED   01/17/2018 1330 01/19/2018 1818 Partial Code [id number]  Milagros Loll, MD Inpatient   01/16/2018 1427 01/17/2018 1330 Full Code [id number]  Auburn Bilberry, MD ED   Advance Care Planning Activity     Family Communication: Refused Disposition Plan: Status is: Inpatient  Dispo: The patient is from: Home              Anticipated d/c is to: Home              Anticipated d/c date is: Likely will need a few days of IV antibiotics for his lower extremity cellulitis and infected ulcers              Patient currently receiving IV antibiotics for infected lower extremity skin ulcerations.  Also being treated for CHF exacerbation  Consultants:  Cardiology  Antibiotics:  Vancomycin  Cefepime  Time spent: 32 minutes  Sheryl [employer]

## 2019-08-09 NOTE — ED Notes (Signed)
Pt given meal tray.

## 2019-08-10 ENCOUNTER — Ambulatory Visit: Payer: Medicaid Other | Admitting: Physician Assistant

## 2019-08-10 DIAGNOSIS — F10930 Alcohol use, unspecified with withdrawal, uncomplicated: Secondary | ICD-10-CM

## 2019-08-10 DIAGNOSIS — F10230 Alcohol dependence with withdrawal, uncomplicated: Secondary | ICD-10-CM

## 2019-08-10 DIAGNOSIS — M542 Cervicalgia: Secondary | ICD-10-CM

## 2019-08-10 LAB — GLUCOSE, CAPILLARY
Glucose-Capillary: 126 mg/dL — ABNORMAL HIGH (ref 70–99)
Glucose-Capillary: 145 mg/dL — ABNORMAL HIGH (ref 70–99)
Glucose-Capillary: 115 mg/dL — ABNORMAL HIGH (ref 70–99)

## 2019-08-10 LAB — PROTIME-INR
INR: 2.8 — ABNORMAL HIGH (ref 0.8–1.2)
Prothrombin Time: 28.4 seconds — ABNORMAL HIGH (ref 11.4–15.2)

## 2019-08-10 LAB — BASIC METABOLIC PANEL
Anion gap: 12 (ref 5–15)
BUN: 11 mg/dL (ref 6–20)
CO2: 31 mmol/L (ref 22–32)
Calcium: 8.1 mg/dL — ABNORMAL LOW (ref 8.9–10.3)
Chloride: 94 mmol/L — ABNORMAL LOW (ref 98–111)
Creatinine, Ser: 0.89 mg/dL (ref 0.61–1.24)
GFR calc Af Amer: >60 mL/min (ref >60)
GFR calc non Af Amer: >60 mL/min (ref >60)
Glucose, Bld: 115 mg/dL — ABNORMAL HIGH (ref 70–99)
Potassium: 3.7 mmol/L (ref 3.5–5.1)
Sodium: 137 mmol/L (ref 135–145)

## 2019-08-10 LAB — MAGNESIUM: Magnesium: 1.7 mg/dL (ref 1.7–2.4)

## 2019-08-10 MED ORDER — MAGNESIUM SULFATE 2 GM/50ML IV SOLN
2.0000 g | Freq: Once | INTRAVENOUS | Status: AC
  Administered 2019-08-10 (×2): 2 g via INTRAVENOUS
  Filled 2019-08-10: qty 50

## 2019-08-10 MED ORDER — METHYLPREDNISOLONE SODIUM SUCC 40 MG IJ SOLR
40.0000 mg | Freq: Every day | INTRAMUSCULAR | Status: DC
  Administered 2019-08-10 – 2019-08-12 (×3): 40 mg via INTRAVENOUS
  Filled 2019-08-10 (×3): qty 1

## 2019-08-10 MED ORDER — MUPIROCIN 2 % EX OINT
1.0000 "application " | TOPICAL_OINTMENT | Freq: Two times a day (BID) | CUTANEOUS | Status: DC
  Administered 2019-08-10 – 2019-08-14 (×7): 1 via NASAL
  Filled 2019-08-10: qty 22

## 2019-08-10 MED ORDER — WARFARIN SODIUM 2.5 MG PO TABS
2.5000 mg | ORAL_TABLET | Freq: Once | ORAL | Status: AC
  Administered 2019-08-10: 2.5 mg via ORAL
  Filled 2019-08-10: qty 1

## 2019-08-10 MED ORDER — CHLORHEXIDINE GLUCONATE CLOTH 2 % EX PADS
6.0000 | MEDICATED_PAD | Freq: Every day | CUTANEOUS | Status: DC
  Administered 2019-08-12 – 2019-08-14 (×3): 6 via TOPICAL

## 2019-08-10 NOTE — Progress Notes (Signed)
[facility] Cardiology    SUBJECTIVE: [name] [name] is a 59 year old male with a past medical history significant for chronic atrial fibrillation, anticoagulated with warfarin, coronary artery disease, HFpEF, history of a DVT/PE while on Xarelto, history of a TBI, type 2 diabetes, COPD, obstructive sleep apnea, on BiPAP, and morbid obesity who presented to the ED on 08/09/19 for an acute onset of chest pain.  Workup in the ED was significant for lactic acid of 3.6, ethanol level of 350, high sensitivity troponin of 49, 56 respectively, ECG revealing atrial fibrillation with rapid ventricular response with a intraventricular conduction delay, and chest xray revealing vascular congestion and cardiomegaly with minimal interstitial edema.   Today, [name] [name] reports significant improvement in chest pain.  He admits to mild chest congestion, but overall is doing much better.  He continues to experience lower leg swelling and pain.   He is followed in outpatient cardiology by Dr. Clayborn Bigness. Most recent echocardiogram through [facility] on 06/23/19 revealed normal RV and LV systolic function with an EF estimated between 55-60% with no evidence of regional wall abnormalities. Grade 1 diastolic dysfunction noted. No evidence of significant valvular abnormalities.    Vitals:   08/09/19 1551 08/09/19 1917 08/10/19 0008 08/10/19 0509  BP: (!) 145/74 105/61 (!) 108/44 127/89  Pulse: 96 (!) 107 (!) 102 (!) 101  Resp: 19 20 20 20   Temp:  99.2 F (37.3 C) 98.4 F (36.9 C) 97.7 F (36.5 C)  TempSrc:  Oral Oral Oral  SpO2: 98% 100% 100% 100%  Weight: (!) 190.5 kg     Height: 5\' 8"  (1.727 m)        Intake/Output Summary (Last 24 hours) at 08/10/2019 0736 Last data filed at 08/10/2019 0600 Gross per 24 hour  Intake 1902.89 ml  Output 1300 ml  Net 602.89 ml      PHYSICAL EXAM  General: Well developed, well nourished, in no acute distress HEENT:  Normocephalic and atramatic Neck:  No JVD.  Lungs: Clear  bilaterally to auscultation and percussion. Heart: HRRR . Normal S1 and S2 without gallops or murmurs.  Abdomen: Bowel sounds are positive, abdomen soft and non-tender  Msk:  Back normal, normal gait. Normal strength and tone for age. Extremities: No clubbing, cyanosis or edema.   Neuro: Alert and oriented X 3. Psych:  Good affect, responds appropriately   LABS: Basic Metabolic Panel: Recent Labs    08/09/19 0346 08/09/19 0346 08/09/19 0914 08/10/19 0520  NA 136  --   --  137  K 3.7  --   --  3.7  CL 91*  --   --  94*  CO2 28  --   --  31  GLUCOSE 134*  --   --  115*  BUN 12  --   --  11  CREATININE 1.03  --   --  0.89  CALCIUM 8.0*  --   --  8.1*  MG 2.2   < > 2.1 1.7  PHOS 2.0*  --   --   --    < > = values in this interval not displayed.   Liver Function Tests: Recent Labs    08/09/19 0346  AST 77*  ALT 44  ALKPHOS 107  BILITOT 0.8  PROT 8.2*  ALBUMIN 4.0   Recent Labs    08/09/19 0346  LIPASE 41   CBC: Recent Labs    08/09/19 0346  WBC 6.9  NEUTROABS 4.5  HGB 12.5*  HCT 39.0  MCV 89.9  PLT 238   Cardiac Enzymes: Recent Labs    08/09/19 0346  CKTOTAL 349   BNP: Invalid input(s): POCBNP D-Dimer: No results for input(s): DDIMER in the last 72 hours. Hemoglobin A1C: No results for input(s): HGBA1C in the last 72 hours. Fasting Lipid Panel: No results for input(s): CHOL, HDL, LDLCALC, TRIG, CHOLHDL, LDLDIRECT in the last 72 hours. Thyroid Function Tests: No results for input(s): TSH, T4TOTAL, T3FREE, THYROIDAB in the last 72 hours.  Invalid input(s): FREET3 Anemia Panel: No results for input(s): VITAMINB12, FOLATE, FERRITIN, TIBC, IRON, RETICCTPCT in the last 72 hours.  DG Ankle 2 Views Left  Result Date: 08/09/2019 CLINICAL DATA:  Acute on chronic wounds EXAM: LEFT ANKLE - 2 VIEW COMPARISON:  None. FINDINGS: Extensive severe edematous changes of the lower extremities with some benign-appearing soft tissue mineralization, likely sequela of  chronic venous stasis. Atherosclerotic calcifications are present in the soft tissues. No radiographically evident site of focal ulceration is seen. No worrisome destructive, erosive, subcortical lucent or periostitis changes to suggest features of osteomyelitis. Mild to moderate degenerative changes present in the ankle, mid and hindfoot. Midfoot and hindfoot alignment is grossly preserved though incompletely assessed on nondedicated, nonweightbearing films. Remote posttraumatic deformity of the proximal tibial and distal fibular diaphyses. Bidirectional calcaneal spurs. IMPRESSION: 1. Extensive severe edematous changes of the lower extremities with some benign-appearing soft tissue mineralization, likely sequela of chronic venous stasis. 2. No radiographically evident site of focal ulceration nor convincing radiographic evidence of acute osteomyelitis. If persisting concern, MRI is more sensitive and specific for early changes. 3. Degenerative changes as above. Electronically Signed   By: Kreg Shropshire M.D.   On: 08/09/2019 04:59   DG Ankle 2 Views Right  Result Date: 08/09/2019 CLINICAL DATA:  Acute on chronic wounds, concern for osteomyelitis EXAM: RIGHT ANKLE - 2 VIEW COMPARISON:  None. FINDINGS: Diffuse edematous changes of the lower extremity with extensive vascular calcifications. No discernible focal soft tissue ulceration. No destructive, erosive, subcortical lucent or periostitis changes to suggest a discernible site of acute osteomyelitis. Corticated os trigonum posterior to the talus. Os peroneum noted along the arch. Bidirectional calcaneal spurs. Moderate to severe arthrosis about the ankle with spurring along the medial and lateral malleoli. Mild to moderate degenerative changes in the mid and hindfoot. Midfoot and hindfoot alignment is grossly preserved though incompletely assessed on nonweightbearing films. IMPRESSION: 1. No discernible focal soft tissue ulceration nor convincing site  radiographically evident osteomyelitis. If there is continued clinical concern, MRI is more sensitive and specific. 2. Diffuse edematous changes of the lower extremity with extensive vascular calcifications. 3. Degenerative changes, as above. Electronically Signed   By: Kreg Shropshire M.D.   On: 08/09/2019 04:52   CT ANGIO CHEST PE W OR WO CONTRAST  Result Date: 08/09/2019 CLINICAL DATA:  59 year old male with history of chest pain for 1 day. EXAM: CT ANGIOGRAPHY CHEST WITH CONTRAST TECHNIQUE: Multidetector CT imaging of the chest was performed using the standard protocol during bolus administration of intravenous contrast. Multiplanar CT image reconstructions and MIPs were obtained to evaluate the vascular anatomy. CONTRAST:  OMNIPAQUE IOHEXOL 350 MG/ML SOLN COMPARISON:  Chest CT 03/04/2019. FINDINGS: Cardiovascular: Heart size is normal. There is no significant pericardial fluid, thickening or pericardial calcification. There is aortic atherosclerosis, as well as atherosclerosis of the great vessels of the mediastinum and the coronary arteries, including calcified atherosclerotic plaque in the left main, left anterior descending and right coronary arteries. Mediastinum/Nodes: No pathologically enlarged mediastinal or  hilar lymph nodes. Esophagus is unremarkable in appearance. No axillary lymphadenopathy. Lungs/Pleura: No acute consolidative airspace disease. No pleural effusions. No suspicious appearing pulmonary nodules or masses are noted. Upper Abdomen: Severe diffuse low attenuation throughout the visualized hepatic parenchyma, indicative of severe hepatic steatosis. Musculoskeletal: There are no aggressive appearing lytic or blastic lesions noted in the visualized portions of the skeleton. Review of the MIP images confirms the above findings. IMPRESSION: 1. No evidence of pulmonary embolism. No acute findings in the thorax to account for the patient's symptoms. 2. Aortic atherosclerosis, in addition  to left main and 2 vessel coronary artery disease. Please note that although the presence of coronary artery calcium documents the presence of coronary artery disease, the severity of this disease and any potential stenosis cannot be assessed on this non-gated CT examination. Assessment for potential risk factor modification, dietary therapy or pharmacologic therapy may be warranted, if clinically indicated. 3. Severe hepatic steatosis. Aortic Atherosclerosis (ICD10-I70.0). Electronically Signed   By: Trudie Reed M.D.   On: 08/09/2019 15:11   DG Chest Port 1 View  Result Date: 08/09/2019 CLINICAL DATA:  Chest pain, bright red blood per rectum EXAM: PORTABLE CHEST 1 VIEW COMPARISON:  Radiograph 06/22/2019, CT 03/04/2019 FINDINGS: Low lung volumes and central vascular congestion. Cardiomegaly is similar to prior. Hazy and interstitial opacities in the lungs without focal consolidation. No pneumothorax or visible effusion. No acute osseous or soft tissue abnormality. Telemetry leads overlie the chest. IMPRESSION: Low volumes and atelectasis. Vascular congestion and cardiomegaly with at most minimal interstitial edema. Electronically Signed   By: Kreg Shropshire M.D.   On: 08/09/2019 04:49     Echo: Through [facility] on 06/23/19 revealed normal RV and LV systolic function with an EF estimated between 55-60% with no evidence of regional wall abnormalities. Grade 1 diastolic dysfunction noted. No evidence of significant valvular abnormalities.   TELEMETRY: Atrial fibrillation with varying ventricular response; ranging from 90s-115bpm   ASSESSMENT AND PLAN:  Principal Problem:   Sepsis (HCC) Active Problems:   Chronic combined systolic and diastolic heart failure (HCC)   COPD (chronic obstructive pulmonary disease) (HCC)   Obstructive sleep apnea   Lymphedema   Diet-controlled diabetes mellitus (HCC)   HTN (hypertension)   Morbid obesity (HCC)   Chronic pain syndrome   Cellulitis of both lower  extremities   Chronic anticoagulation (warfarin  COUMADIN)   NSTEMI (non-ST elevated myocardial infarction) (HCC)   Lower extremity ulceration, unspecified laterality, with fat layer exposed (HCC)   Alcoholic intoxication without complication (HCC)    1.  Chest pain   -Symptoms have significantly improved; Chest CT negative for a PE   -Troponin mildly elevated x 2, 49 and 56 respectively; likely demand ischemia in the setting of atrial fibrillation with RVR, sepsis   -No plan for further invasive workup; heparin discontinued   2.  Atrial fibrillation   -Currently in atrial fibrillation with varying ventricular response; ranging from 90s at rest and low 100s with movement/ambulation   -Heparin d/c, warfarin resumed   -Would recommend to continue with metoprolol 50mg  daily for rate control   3.  Morbid obesity   -Had a long discussion about the importance of weight loss through dietary modifications; weight loss necessary prior to consideration for right hip replacement    The history, physical exam findings, and plan of care were all discussed with Dr. , and all decision making was made in collaboration.   Harold Hedge  PA-C 08/10/2019 7:36 AM

## 2019-08-10 NOTE — Consult Note (Signed)
ANTICOAGULATION CONSULT NOTE  Pharmacy Consult for Warfarin Dosing  Indication: atrial fibrillation  No Known Allergies  Patient Measurements: Height: 5\' 8"  (172.7 cm) Weight: (!) 190.5 kg (420 lb) IBW/kg (Calculated) : 68.4  Vital Signs: Temp: 97.7 F (36.5 C) (06/25 0509) Temp Source: Oral (06/25 0509) BP: 127/89 (06/25 0509) Pulse Rate: 101 (06/25 0509)  Labs: Recent Labs    08/09/19 0346 08/09/19 1254 08/09/19 1354 08/10/19 0520  HGB 12.5*  --   --   --   HCT 39.0  --   --   --   PLT 238  --   --   --   APTT 37*  --   --   --   LABPROT 22.3*  --   --  28.4*  INR 2.0*  --   --  2.8*  HEPARINUNFRC  --  0.23*  --   --   CREATININE 1.03  --   --  0.89  CKTOTAL 349  --   --   --   TROPONINIHS 49*  --  56*  --     Estimated Creatinine Clearance: 150 mL/min (by C-G formula based on SCr of 0.89 mg/dL).   Medical History: Past Medical History:  Diagnosis Date  . Anxiety   . Arthritis   . Asthma   . Brain damage   . Chronic pain of both knees 07/13/2018  . Clotting disorder (HCC)   . COPD (chronic obstructive pulmonary disease) (HCC)   . Depression   . HFrEF (heart failure with reduced ejection fraction) (HCC)    a. 03/2018 Echo: EF 25-30%, diff HK. Mod LAE.  04/2018 History of DVT (deep vein thrombosis)   . History of pulmonary embolism    a. Chronic coumadin.  [name] Hypertension   . MI (myocardial infarction) (HCC)   . Morbid obesity (HCC)   . Neck pain 07/13/2018  . NICM (nonischemic cardiomyopathy) (HCC)    a. s/p Cath x 3 - reportedly nl cors. Last cath 2019 in [place]; b. a. 03/2018 Echo: EF 25-30%, diff HK.  04/2018 Persistent atrial fibrillation (HCC)    a. 03/2018 s/p DCCV; b. CHA2DS2VASc = 1-->Xarelto (later changed to warfarin); c. 05/2018 recurrent afib-->Amio initiated.  . Sleep apnea     Assessment: Pharmacy consulted for warfarin dosing and monitoring for 59 yo male with PMH of A. Fib. INR therapeutic on admission. DDI: cefepime, asa, APAP.   Home Regimen:  Warfarin 5mg    DATE INR DOSE 6/24 2.0 5 mg 6/25 2.8 2.5 mg   Goal of Therapy:  INR: 2-3   Plan:  INR is still therapeutic. INR bumped from 2 to 2.8. Will give half of patient's home dose (warfarin 2.5 mg x 1) tonight. Daily INR ordered. CBC at least every 3 days. No major DDIs.   7/24, PharmD, BCPS Clinical Pharmacist 08/10/2019 7:26 AM

## 2019-08-10 NOTE — Progress Notes (Signed)
Patient ID: [name] [name], male   DOB: [birth year], 59 y.o.   MRN: [id number] [facility] PROGRESS NOTE  Yannis Broce HER:[id number] DOB: 25-May-1960 DOA: 08/09/2019 PCP: Doreen Beam, FNP  HPI/Subjective: Patient coming in with bilateral leg pain and ulcerations.  Today he tells me that his shoulders and neck are painful and he can hardly lift his arms.  Still having pain in his legs.  Very shaky and just received Ativan.  Objective: Vitals:   08/10/19 0853 08/10/19 1225  BP: (!) 108/59 128/76  Pulse: (!) 108 72  Resp: 16 18  Temp: 98.2 F (36.8 C) 98.5 F (36.9 C)  SpO2: 94% 94%    Intake/Output Summary (Last 24 hours) at 08/10/2019 1415 Last data filed at 08/10/2019 0600 Gross per 24 hour  Intake 1902.89 ml  Output 1100 ml  Net 802.89 ml   Filed Weights   08/09/19 0322 08/09/19 1551  Weight: (!) 195 kg (!) 190.5 kg    ROS: Review of Systems  Respiratory: Positive for shortness of breath.   Cardiovascular: Positive for chest pain.  Gastrointestinal: Negative for abdominal pain.  Musculoskeletal: Positive for joint pain, myalgias and neck pain.   Exam: Physical Exam  HENT:  Nose: No mucosal edema.  Mouth/Throat: No oropharyngeal exudate.  Eyes: Pupils are equal, round, and reactive to light. Lids are normal.  Cardiovascular: S1 normal, S2 normal and normal heart sounds. An irregularly irregular rhythm present.  No murmur heard. Respiratory: He has decreased breath sounds in the right lower field and the left lower field. He has no wheezes. He has no rhonchi. He has no rales.  GI: Soft. There is no abdominal tenderness.  Musculoskeletal:     Right shoulder: Decreased range of motion.     Left shoulder: Decreased range of motion.     Right ankle: Swelling present.     Left ankle: Swelling present.  Neurological: He is alert.  Positive tremor  Skin:  Legs covered with Unna boots.      Data Reviewed: Basic Metabolic Panel: Recent Labs  Lab  08/09/19 0346 08/09/19 0914 08/10/19 0520  NA 136  --  137  K 3.7  --  3.7  CL 91*  --  94*  CO2 28  --  31  GLUCOSE 134*  --  115*  BUN 12  --  11  CREATININE 1.03  --  0.89  CALCIUM 8.0*  --  8.1*  MG 2.2 2.1 1.7  PHOS 2.0*  --   --    Liver Function Tests: Recent Labs  Lab 08/09/19 0346  AST 77*  ALT 44  ALKPHOS 107  BILITOT 0.8  PROT 8.2*  ALBUMIN 4.0   Recent Labs  Lab 08/09/19 0346  LIPASE 41   Recent Labs  Lab 08/09/19 0346  AMMONIA <9*   CBC: Recent Labs  Lab 08/09/19 0346  WBC 6.9  NEUTROABS 4.5  HGB 12.5*  HCT 39.0  MCV 89.9  PLT 238   Cardiac Enzymes: Recent Labs  Lab 08/09/19 0346  CKTOTAL 349   BNP (last 3 results) Recent Labs    05/02/19 1546 06/22/19 0852 08/09/19 0346  BNP 44.1 118.0* 67.1    CBG: Recent Labs  Lab 08/09/19 1112 08/09/19 1824 08/09/19 2104 08/10/19 0745 08/10/19 1225  GLUCAP 107* 165* 157* 126* 145*    Recent Results (from the past 240 hour(s))  SARS Coronavirus 2 by RT PCR (hospital order, performed in [facility] hospital lab) Nasopharyngeal Nasopharyngeal Swab  Status: None   Collection Time: 08/09/19  3:46 AM   Specimen: Nasopharyngeal Swab  Result Value Ref Range Status   SARS Coronavirus 2 NEGATIVE NEGATIVE Final    Comment: (NOTE) SARS-CoV-2 target nucleic acids are NOT DETECTED.  The SARS-CoV-2 RNA is generally detectable in upper and lower respiratory specimens during the acute phase of infection. The lowest concentration of SARS-CoV-2 viral copies this assay can detect is 250 copies / mL. A negative result does not preclude SARS-CoV-2 infection and should not be used as the sole basis for treatment or other patient management decisions.  A negative result may occur with improper specimen collection / handling, submission of specimen other than nasopharyngeal swab, presence of viral mutation(s) within the areas targeted by this assay, and inadequate number of viral copies (<250  copies / mL). A negative result must be combined with clinical observations, patient history, and epidemiological information.  Fact Sheet for Patients:   BoilerBrush.com.cy  Fact Sheet for Healthcare Providers: https://pope.com/  This test is not yet approved or  cleared by the [place] FDA and has been authorized for detection and/or diagnosis of SARS-CoV-2 by FDA under an Emergency Use Authorization (EUA).  This EUA will remain in effect (meaning this test can be used) for the duration of the COVID-19 declaration under Section 564(b)(1) of the Act, 21 U.S.C. section 360bbb-3(b)(1), unless the authorization is terminated or revoked sooner.  Performed at [facility], [address] [place], [place] [address]   Blood Culture (routine x 2)     Status: None (Preliminary result)   Collection Time: 08/09/19  3:46 AM   Specimen: BLOOD  Result Value Ref Range Status   Specimen Description BLOOD RIGHT [name]  Final   Special Requests   Final    BOTTLES DRAWN AEROBIC AND ANAEROBIC Blood Culture adequate volume   Culture   Final    NO GROWTH 1 DAY Performed at [facility], [address] [place], [place] [address]    Report Status PENDING  Incomplete  Blood Culture (routine x 2)     Status: None (Preliminary result)   Collection Time: 08/09/19  3:46 AM   Specimen: BLOOD  Result Value Ref Range Status   Specimen Description BLOOD LEFT HAND  Final   Special Requests   Final    BOTTLES DRAWN AEROBIC AND ANAEROBIC Blood Culture results may not be optimal due to an excessive volume of blood received in culture bottles   Culture   Final    NO GROWTH 1 DAY Performed at [facility], [address], [place], [place] [address]    Report Status PENDING  Incomplete  MRSA PCR Screening     Status: Abnormal   Collection Time: 08/09/19  5:18 PM   Specimen: Nasopharyngeal  Result Value Ref Range Status   MRSA by  PCR POSITIVE (A) NEGATIVE Final    Comment:        The GeneXpert MRSA Assay (FDA approved for NASAL specimens only), is one component of a comprehensive MRSA colonization surveillance program. It is not intended to diagnose MRSA infection nor to guide or monitor treatment for MRSA infections. RESULT CALLED TO, READ BACK BY AND VERIFIED WITH: Richardean Sale 08/09/19 @1932  BY ACR Performed at [facility], [address] [place], [place]      Studies: DG Ankle 2 Views Left  Result Date: 08/09/2019 CLINICAL DATA:  Acute on chronic wounds EXAM: LEFT ANKLE - 2 VIEW COMPARISON:  None. FINDINGS:  Extensive severe edematous changes of the lower extremities with some benign-appearing soft tissue mineralization, likely sequela of chronic venous stasis. Atherosclerotic calcifications are present in the soft tissues. No radiographically evident site of focal ulceration is seen. No worrisome destructive, erosive, subcortical lucent or periostitis changes to suggest features of osteomyelitis. Mild to moderate degenerative changes present in the ankle, mid and hindfoot. Midfoot and hindfoot alignment is grossly preserved though incompletely assessed on nondedicated, nonweightbearing films. Remote posttraumatic deformity of the proximal tibial and distal fibular diaphyses. Bidirectional calcaneal spurs. IMPRESSION: 1. Extensive severe edematous changes of the lower extremities with some benign-appearing soft tissue mineralization, likely sequela of chronic venous stasis. 2. No radiographically evident site of focal ulceration nor convincing radiographic evidence of acute osteomyelitis. If persisting concern, MRI is more sensitive and specific for early changes. 3. Degenerative changes as above. Electronically Signed   By: Kreg Shropshire M.D.   On: 08/09/2019 04:59   DG Ankle 2 Views Right  Result Date: 08/09/2019 CLINICAL DATA:  Acute on chronic wounds, concern for osteomyelitis EXAM: RIGHT  ANKLE - 2 VIEW COMPARISON:  None. FINDINGS: Diffuse edematous changes of the lower extremity with extensive vascular calcifications. No discernible focal soft tissue ulceration. No destructive, erosive, subcortical lucent or periostitis changes to suggest a discernible site of acute osteomyelitis. Corticated os trigonum posterior to the talus. Os peroneum noted along the arch. Bidirectional calcaneal spurs. Moderate to severe arthrosis about the ankle with spurring along the medial and lateral malleoli. Mild to moderate degenerative changes in the mid and hindfoot. Midfoot and hindfoot alignment is grossly preserved though incompletely assessed on nonweightbearing films. IMPRESSION: 1. No discernible focal soft tissue ulceration nor convincing site radiographically evident osteomyelitis. If there is continued clinical concern, MRI is more sensitive and specific. 2. Diffuse edematous changes of the lower extremity with extensive vascular calcifications. 3. Degenerative changes, as above. Electronically Signed   By: Kreg Shropshire M.D.   On: 08/09/2019 04:52   CT ANGIO CHEST PE W OR WO CONTRAST  Result Date: 08/09/2019 CLINICAL DATA:  59 year old male with history of chest pain for 1 day. EXAM: CT ANGIOGRAPHY CHEST WITH CONTRAST TECHNIQUE: Multidetector CT imaging of the chest was performed using the standard protocol during bolus administration of intravenous contrast. Multiplanar CT image reconstructions and MIPs were obtained to evaluate the vascular anatomy. CONTRAST:  OMNIPAQUE IOHEXOL 350 MG/ML SOLN COMPARISON:  Chest CT 03/04/2019. FINDINGS: Cardiovascular: Heart size is normal. There is no significant pericardial fluid, thickening or pericardial calcification. There is aortic atherosclerosis, as well as atherosclerosis of the great vessels of the mediastinum and the coronary arteries, including calcified atherosclerotic plaque in the left main, left anterior descending and right coronary arteries.  Mediastinum/Nodes: No pathologically enlarged mediastinal or hilar lymph nodes. Esophagus is unremarkable in appearance. No axillary lymphadenopathy. Lungs/Pleura: No acute consolidative airspace disease. No pleural effusions. No suspicious appearing pulmonary nodules or masses are noted. Upper Abdomen: Severe diffuse low attenuation throughout the visualized hepatic parenchyma, indicative of severe hepatic steatosis. Musculoskeletal: There are no aggressive appearing lytic or blastic lesions noted in the visualized portions of the skeleton. Review of the MIP images confirms the above findings. IMPRESSION: 1. No evidence of pulmonary embolism. No acute findings in the thorax to account for the patient's symptoms. 2. Aortic atherosclerosis, in addition to left main and 2 vessel coronary artery disease. Please note that although the presence of coronary artery calcium documents the presence of coronary artery disease, the severity of this disease and any potential  stenosis cannot be assessed on this non-gated CT examination. Assessment for potential risk factor modification, dietary therapy or pharmacologic therapy may be warranted, if clinically indicated. 3. Severe hepatic steatosis. Aortic Atherosclerosis (ICD10-I70.0). Electronically Signed   By: Trudie Reed M.D.   On: 08/09/2019 15:11   DG Chest Port 1 View  Result Date: 08/09/2019 CLINICAL DATA:  Chest pain, bright red blood per rectum EXAM: PORTABLE CHEST 1 VIEW COMPARISON:  Radiograph 06/22/2019, CT 03/04/2019 FINDINGS: Low lung volumes and central vascular congestion. Cardiomegaly is similar to prior. Hazy and interstitial opacities in the lungs without focal consolidation. No pneumothorax or visible effusion. No acute osseous or soft tissue abnormality. Telemetry leads overlie the chest. IMPRESSION: Low volumes and atelectasis. Vascular congestion and cardiomegaly with at most minimal interstitial edema. Electronically Signed   By: Kreg Shropshire M.D.    On: 08/09/2019 04:49    Scheduled Meds:  aspirin EC  81 mg Oral Daily   atorvastatin  40 mg Oral Daily   folic acid  1 mg Oral Daily   furosemide  40 mg Intravenous Daily   insulin aspart  0-20 Units Subcutaneous TID WC   insulin aspart  0-5 Units Subcutaneous QHS   lisinopril  2.5 mg Oral Daily   LORazepam  0-4 mg Intravenous Q6H   Followed by   Melene Muller ON 08/11/2019] LORazepam  0-4 mg Intravenous Q12H   methylPREDNISolone (SOLU-MEDROL) injection  40 mg Intravenous Daily   metoprolol succinate  50 mg Oral Daily   multivitamin with minerals  1 tablet Oral Daily   thiamine  100 mg Oral Daily   Or   thiamine  100 mg Intravenous Daily   warfarin  2.5 mg Oral ONCE-1600   Warfarin - Pharmacist Dosing Inpatient   Does not apply q1600   Continuous Infusions:  ceFEPime (MAXIPIME) IV 2 g (08/10/19 0546)   vancomycin 1,500 mg (08/09/19 1926)    Assessment/Plan:  1. Clinical sepsis present on admission with bilateral lower extremity ulcerations with surrounding erythema consistent with cellulitis.  Patient placed on Maxipime and vancomycin.  I ordered a wound culture but it does not look like it has been sent. 2. Acute alcohol withdrawal.  Continue alcohol withdrawal protocol.  High risk for delirium tremens.  Patient is more shaky today than yesterday.  Continue to watch closely. 3. Neck pain and inability to elevate shoulders.  Concern for cervical radiculopathy will get an MRI of the cervical spine.  Will give steroids just in case this is polymyalgia rheumatica. 4. Acute on chronic diastolic congestive heart failure on Lasix IV 5. Chronic atrial fibrillation on Coumadin.  Metoprolol for rate control 6. Obstructive sleep apnea on BiPAP at night 7. Elevated troponin demand ischemia. 8. Morbid obesity with a BMI of 63.86   Code Status:     Code Status Orders  (From admission, onward)         Start     Ordered   08/09/19 0615  Full code  Continuous         08/09/19 0621        Code Status History    Date Active Date Inactive Code Status Order ID Comments User Context   06/22/2019 1531 06/26/2019 1742 Full Code [id number]  Lorretta Harp, MD Inpatient   03/04/2019 2314 03/05/2019 2105 Full Code [id number]  Anselm Jungling, DO ED   10/24/2018 0420 10/28/2018 1849 Full Code [id number]  Oralia Manis, MD Inpatient   08/06/2018 1815 08/10/2018 2042 Full Code [id number]  Alford Highland,  MD ED   07/18/2018 0716 07/23/2018 1827 Full Code [id number]  Arnaldo Natal, MD ED   05/29/2018 1453 06/01/2018 1931 Full Code [id number]  Bertrum Sol, MD Inpatient   03/17/2018 0042 03/25/2018 1554 Full Code [id number]  Altamese Dilling, MD ED   02/24/2018 0725 02/27/2018 1555 Full Code [id number]  Oralia Manis, MD Inpatient   01/27/2018 1345 01/29/2018 1406 Full Code [id number]  Alford Highland, MD ED   01/17/2018 1330 01/19/2018 1818 Partial Code [id number]  Milagros Loll, MD Inpatient   01/16/2018 1427 01/17/2018 1330 Full Code [id number]  Auburn Bilberry, MD ED   Advance Care Planning Activity     Family Communication: Refused Disposition Plan: Status is: Inpatient   Dispo: The patient is from: Home              Anticipated d/c is to: Home              Anticipated d/c date is: We will need greater than 3 days here secondary to going through alcohol withdrawal              Patient currently being treated for clinical sepsis with IV antibiotics and also going through alcohol withdrawal.  Concern for cervical radiculopathy with neck pain and inability to move arms very well.  Consultants:  Cardiology  Antibiotics:  Vancomycin  Maxipime  Time spent: 32 minutes  Sonam [employer]

## 2019-08-10 NOTE — Consult Note (Signed)
WOC Nurse Consult Note: Reason for Consult:Biilateral venous stasis ulcers with Profore compression wraps.  Change twice weekly.  Wound type:venous insufficiency Pressure Injury POA: NA Measurement:Left lateral lower leg:  8 cm x 4.5 cm x 0.4 cm  Right lateral leg:  2 cm x 4 cmx 0.2 cm  Wound NGX:EXPFR red Drainage (amount, consistency, odor) heavy serosanguinous  No odor.  Periwound:edema and erythema.   Dressing procedure/placement/frequency:Cleanse legs with soap and water and pat dry.  Apply Aquacel ag to open wounds.  Wrap with 4 layer compression (Profore- [employer] # [id number]).  Change on Tuesday and Friday.  Will not follow at this time.  Please re-consult if needed.  Maple Hudson MSN, RN, FNP-BC CWON Wound, Ostomy, Continence Nurse Pager [phone]

## 2019-08-10 NOTE — Evaluation (Signed)
Physical Therapy Evaluation Patient Details Name: [name] [name] MRN: [id number] DOB: [birth year] Today's Date: 08/10/2019   History of Present Illness  59 y/o M here with b/l LE ulcerations/sepsis. PMH: CHF with EF of 25-30%, left bundle blockade, hypertension, hyperlipidemia, COPD, asthma, hypothyroidism, gout, depression, atrial fibrillation, PE/DVT on Coumadin, OSA on CPAP, CAD, iron deficiency anemia, who presents with shortness breath and some nausea. Chest x-ray with cardiomegaly, vascular congestion and interstitial pulmonary edema.   Clinical Impression  Pt eager to get out of his current bed (getting a new one later today) and get to the recliner, but is not feeling well/strong enough to be willing to even try more than a few feet of assisted in room "ambulation" mobility.  He reports that he is able to manage relatively well with 4WW in the home, but does use electric scooter much of the time and certainly when out of the home.  Pt with O2 in the low 90s on room air, HR generally staying in the 100-120 range during activity.  Pt not at his baseline and would struggle to manage safely at home, recommending STR and pt is in agreement.  He states he has been thinking about a transition out of home into ALF or similar.    Follow Up Recommendations Home health PT    Equipment Recommendations  None recommended by PT    Recommendations for Other Services       Precautions / Restrictions Precautions Precautions: Fall Restrictions Weight Bearing Restrictions: No      Mobility  Bed Mobility Overal bed mobility: Modified Independent             General bed mobility comments: Pt able to get himself to EOB with heavy UE use of rails  Transfers Overall transfer level: Needs assistance Equipment used: Rolling walker (2 wheeled) Transfers: Sit to/from Stand Sit to Stand: Min guard         General transfer comment: Pt struggled to shift weight forward enough to get upright,  though with extra time and effort was able to rise w/o direct assist  Ambulation/Gait Ambulation/Gait assistance: Min guard;Min assist Gait Distance (Feet): 5 Feet Assistive device: Rolling walker (2 wheeled)       General Gait Details: Pt with hesitant, slow, small steps from bed to recliner.  Reliant on walker, poor tolerance, O2 in the low 90s on room air, HR to 120s  Stairs            Wheelchair Mobility    Modified Rankin (Stroke Patients Only)       Balance Overall balance assessment: Needs assistance Sitting-balance support: Bilateral upper extremity supported Sitting balance-Leahy Scale: Good     Standing balance support: Bilateral upper extremity supported Standing balance-Leahy Scale: Fair Standing balance comment: reliant on walker, poor standing tolerance, c/o dizziness t/o standing effort                             Pertinent Vitals/Pain Pain Assessment:  (chronic pain)    Home Living Family/patient expects to be discharged to:: Skilled nursing facility Living Arrangements: Alone Available Help at Discharge:  (h/o in-home aides, not currently?)           Home Equipment: Walker - 4 wheels;Electric scooter;Shower seat;Hand held shower head      Prior Function Level of Independence: Independent with assistive device(s)         Comments: Pt out of the home weekly for MD appointments,  does grocery shopping 1x/month and uses motorized cart     Hand Dominance        Extremity/Trunk Assessment   Upper Extremity Assessment Upper Extremity Assessment: Generalized weakness    Lower Extremity Assessment Lower Extremity Assessment: Generalized weakness       Communication   Communication: No difficulties  Cognition Arousal/Alertness: Awake/alert Behavior During Therapy: WFL for tasks assessed/performed Overall Cognitive Status: Within Functional Limits for tasks assessed                                         General Comments      Exercises     Assessment/Plan    PT Assessment Patient needs continued PT services  PT Problem List Decreased strength;Decreased range of motion;Decreased activity tolerance;Decreased balance;Decreased knowledge of use of DME;Decreased cognition;Decreased coordination       PT Treatment Interventions DME instruction;Gait training;Stair training;Functional mobility training;Therapeutic activities;Therapeutic exercise;Balance training;Neuromuscular re-education;Patient/family education    PT Goals (Current goals can be found in the Care Plan section)  Acute Rehab PT Goals Patient Stated Goal: pt thinking about transitioning to ALF setting PT Goal Formulation: With patient Time For Goal Achievement: 08/24/19 Potential to Achieve Goals: Fair    Frequency Min 2X/week   Barriers to discharge        Co-evaluation               AM-PAC PT "6 Clicks" Mobility  Outcome Measure Help needed turning from your back to your side while in a flat bed without using bedrails?: None Help needed moving from lying on your back to sitting on the side of a flat bed without using bedrails?: None Help needed moving to and from a bed to a chair (including a wheelchair)?: None Help needed standing up from a chair using your arms (e.g., wheelchair or bedside chair)?: None Help needed to walk in hospital room?: A Little Help needed climbing 3-5 steps with a railing? : A Little 6 Click Score: 22    End of Session Equipment Utilized During Treatment: Gait belt Activity Tolerance: Patient limited by pain;Patient limited by fatigue Patient left: with chair alarm set;with call bell/phone within reach Nurse Communication: Mobility status (possibly needs bari chair?) PT Visit Diagnosis: Muscle weakness (generalized) (M62.81);Difficulty in walking, not elsewhere classified (R26.2);Unsteadiness on feet (R26.81)    Time: 0947-0962 PT Time Calculation (min) (ACUTE ONLY): 29  min   Charges:   PT Evaluation $PT Eval Low Complexity: 1 Low PT Treatments $Therapeutic Activity: 8-22 mins        Malachi Pro, DPT 08/10/2019, 11:01 AM

## 2019-08-11 ENCOUNTER — Inpatient Hospital Stay: Payer: Medicaid Other

## 2019-08-11 DIAGNOSIS — Z6841 Body Mass Index (BMI) 40.0 and over, adult: Secondary | ICD-10-CM

## 2019-08-11 LAB — CBC
HCT: 36 % — ABNORMAL LOW (ref 39.0–52.0)
Hemoglobin: 12 g/dL — ABNORMAL LOW (ref 13.0–17.0)
MCH: 29.3 pg (ref 26.0–34.0)
MCHC: 33.3 g/dL (ref 30.0–36.0)
MCV: 88 fL (ref 80.0–100.0)
Platelets: 149 10*3/uL — ABNORMAL LOW (ref 150–400)
RBC: 4.09 MIL/uL — ABNORMAL LOW (ref 4.22–5.81)
RDW: 17.9 % — ABNORMAL HIGH (ref 11.5–15.5)
WBC: 7.9 10*3/uL (ref 4.0–10.5)
nRBC: 0 % (ref 0.0–0.2)

## 2019-08-11 LAB — BASIC METABOLIC PANEL
Anion gap: 14 (ref 5–15)
BUN: 11 mg/dL (ref 6–20)
CO2: 26 mmol/L (ref 22–32)
Calcium: 8.8 mg/dL — ABNORMAL LOW (ref 8.9–10.3)
Chloride: 92 mmol/L — ABNORMAL LOW (ref 98–111)
Creatinine, Ser: 0.81 mg/dL (ref 0.61–1.24)
GFR calc Af Amer: >60 mL/min (ref >60)
GFR calc non Af Amer: >60 mL/min (ref >60)
Glucose, Bld: 170 mg/dL — ABNORMAL HIGH (ref 70–99)
Potassium: 3.9 mmol/L (ref 3.5–5.1)
Sodium: 132 mmol/L — ABNORMAL LOW (ref 135–145)

## 2019-08-11 LAB — URINE CULTURE
Culture: 10000 — AB
Special Requests: NORMAL

## 2019-08-11 LAB — GLUCOSE, CAPILLARY
Glucose-Capillary: 172 mg/dL — ABNORMAL HIGH (ref 70–99)
Glucose-Capillary: 148 mg/dL — ABNORMAL HIGH (ref 70–99)
Glucose-Capillary: 222 mg/dL — ABNORMAL HIGH (ref 70–99)

## 2019-08-11 LAB — PROTIME-INR
INR: 2.8 — ABNORMAL HIGH (ref 0.8–1.2)
Prothrombin Time: 28.6 seconds — ABNORMAL HIGH (ref 11.4–15.2)

## 2019-08-11 MED ORDER — TRAZODONE HCL 50 MG PO TABS
150.0000 mg | ORAL_TABLET | Freq: Every day | ORAL | Status: DC
  Administered 2019-08-12: 150 mg via ORAL
  Filled 2019-08-11: qty 1

## 2019-08-11 MED ORDER — WARFARIN SODIUM 3 MG PO TABS
3.0000 mg | ORAL_TABLET | Freq: Once | ORAL | Status: AC
  Administered 2019-08-11: 3 mg via ORAL
  Filled 2019-08-11: qty 1

## 2019-08-11 MED ORDER — NAPHAZOLINE-GLYCERIN 0.012-0.2 % OP SOLN
1.0000 [drp] | Freq: Four times a day (QID) | OPHTHALMIC | Status: DC | PRN
  Administered 2019-08-11: 1 [drp] via OPHTHALMIC
  Filled 2019-08-11: qty 15

## 2019-08-11 NOTE — Consult Note (Signed)
ANTICOAGULATION CONSULT NOTE  Pharmacy Consult for Warfarin Dosing  Indication: atrial fibrillation  No Known Allergies  Patient Measurements: Height: 5\' 8"  (172.7 cm) Weight: (!) 194.7 kg (429 lb 3.8 oz) IBW/kg (Calculated) : 68.4  Vital Signs: Temp: 97.9 F (36.6 C) (06/26 0728) Temp Source: Oral (06/26 0728) BP: 107/83 (06/26 0730) Pulse Rate: 125 (06/26 0730)  Labs: Recent Labs    08/09/19 0346 08/09/19 1254 08/09/19 1354 08/10/19 0520 08/11/19 1022  HGB 12.5*  --   --   --  12.0*  HCT 39.0  --   --   --  36.0*  PLT 238  --   --   --  149*  APTT 37*  --   --   --   --   LABPROT 22.3*  --   --  28.4* 28.6*  INR 2.0*  --   --  2.8* 2.8*  HEPARINUNFRC  --  0.23*  --   --   --   CREATININE 1.03  --   --  0.89 0.81  CKTOTAL 349  --   --   --   --   TROPONINIHS 49*  --  56*  --   --     Estimated Creatinine Clearance: 167.2 mL/min (by C-G formula based on SCr of 0.81 mg/dL).   Medical History: Past Medical History:  Diagnosis Date  . Anxiety   . Arthritis   . Asthma   . Brain damage   . Chronic pain of both knees 07/13/2018  . Clotting disorder (HCC)   . COPD (chronic obstructive pulmonary disease) (HCC)   . Depression   . HFrEF (heart failure with reduced ejection fraction) (HCC)    a. 03/2018 Echo: EF 25-30%, diff HK. Mod LAE.  04/2018 History of DVT (deep vein thrombosis)   . History of pulmonary embolism    a. Chronic coumadin.  [name] Hypertension   . MI (myocardial infarction) (HCC)   . Morbid obesity (HCC)   . Neck pain 07/13/2018  . NICM (nonischemic cardiomyopathy) (HCC)    a. s/p Cath x 3 - reportedly nl cors. Last cath 2019 in [place]; b. a. 03/2018 Echo: EF 25-30%, diff HK.  04/2018 Persistent atrial fibrillation (HCC)    a. 03/2018 s/p DCCV; b. CHA2DS2VASc = 1-->Xarelto (later changed to warfarin); c. 05/2018 recurrent afib-->Amio initiated.  . Sleep apnea     Assessment: Pharmacy consulted for warfarin dosing and monitoring for 59 yo male with PMH of A. Fib. INR  therapeutic on admission. DDI: cefepime, asa, APAP.   Home Regimen: Warfarin 5mg    DATE INR DOSE 6/24 2.0 5 mg 6/25 2.8 2.5 mg  6/26 2.8  Goal of Therapy:  INR: 2-3   Plan:  INR therapeutic. Will give Warfarin 3 mg tonight. Daily INR ordered. CBC at least every 3 days. No major DDIs.   7/25, PharmD, BCPS Clinical Pharmacist 08/11/2019 11:43 AM

## 2019-08-11 NOTE — Progress Notes (Signed)
Patient ID: [name] [name], male   DOB: 1960-11-22, 60 y.o.   MRN: [id number] [facility] PROGRESS NOTE  [name] [name] TIR:[id number] DOB: [birth year] DOA: 08/09/2019 PCP: Berniece Pap, FNP  HPI/Subjective: Patient was admitted with lower extremity pain and wounds that are infected.  Patient feeling very sore all over.  Complains of neck pain and difficulty moving his shoulders but better than yesterday.  Less shakiness today.  Objective: Vitals:   08/11/19 0741 08/11/19 1300  BP:    Pulse:    Resp: (!) 24 20  Temp:    SpO2:      Intake/Output Summary (Last 24 hours) at 08/11/2019 1447 Last data filed at 08/11/2019 1156 Gross per 24 hour  Intake 1664.33 ml  Output 975 ml  Net 689.33 ml   Filed Weights   08/09/19 0322 08/09/19 1551 08/11/19 0437  Weight: (!) 195 kg (!) 190.5 kg (!) 194.7 kg    ROS: Review of Systems  Respiratory: Positive for shortness of breath.   Cardiovascular: Positive for chest pain.  Gastrointestinal: Negative for abdominal pain.  Musculoskeletal: Positive for joint pain.   Exam: Physical Exam HENT:     Head: Normocephalic.     Nose: No mucosal edema.     Mouth/Throat:     Pharynx: No oropharyngeal exudate.  Eyes:     General: Lids are normal.     Pupils: Pupils are equal, round, and reactive to light.  Cardiovascular:     Rate and Rhythm: Rhythm irregularly irregular.     Heart sounds: Normal heart sounds, S1 normal and S2 normal.  Pulmonary:     Breath sounds: No decreased breath sounds, wheezing, rhonchi or rales.  Abdominal:     Palpations: Abdomen is soft.     Tenderness: There is no abdominal tenderness.  Musculoskeletal:     Right ankle: Swelling present.     Left ankle: Swelling present.  Skin:    General: Skin is warm.     Comments: Lower extremity wounds covered by Roland Rack boot  Neurological:     Mental Status: He is alert.     Comments: No tremor seen when I saw him today       Data Reviewed: Basic  Metabolic Panel: Recent Labs  Lab 08/09/19 0346 08/09/19 0914 08/10/19 0520 08/11/19 1022  NA 136  --  137 132*  K 3.7  --  3.7 3.9  CL 91*  --  94* 92*  CO2 28  --  31 26  GLUCOSE 134*  --  115* 170*  BUN 12  --  11 11  CREATININE 1.03  --  0.89 0.81  CALCIUM 8.0*  --  8.1* 8.8*  MG 2.2 2.1 1.7  --   PHOS 2.0*  --   --   --    Liver Function Tests: Recent Labs  Lab 08/09/19 0346  AST 77*  ALT 44  ALKPHOS 107  BILITOT 0.8  PROT 8.2*  ALBUMIN 4.0   Recent Labs  Lab 08/09/19 0346  LIPASE 41   Recent Labs  Lab 08/09/19 0346  AMMONIA <9*   CBC: Recent Labs  Lab 08/09/19 0346 08/11/19 1022  WBC 6.9 7.9  NEUTROABS 4.5  --   HGB 12.5* 12.0*  HCT 39.0 36.0*  MCV 89.9 88.0  PLT 238 149*   Cardiac Enzymes: Recent Labs  Lab 08/09/19 0346  CKTOTAL 349   BNP (last 3 results) Recent Labs    05/02/19 1546 06/22/19 0852 08/09/19 0346  BNP 44.1  118.0* 67.1     CBG: Recent Labs  Lab 08/10/19 0745 08/10/19 1225 08/10/19 1636 08/11/19 0804 08/11/19 1214  GLUCAP 126* 145* 115* 172* 148*    Recent Results (from the past 240 hour(s))  SARS Coronavirus 2 by RT PCR (hospital order, performed in [facility] hospital lab) Nasopharyngeal Nasopharyngeal Swab     Status: None   Collection Time: 08/09/19  3:46 AM   Specimen: Nasopharyngeal Swab  Result Value Ref Range Status   SARS Coronavirus 2 NEGATIVE NEGATIVE Final    Comment: (NOTE) SARS-CoV-2 target nucleic acids are NOT DETECTED.  The SARS-CoV-2 RNA is generally detectable in upper and lower respiratory specimens during the acute phase of infection. The lowest concentration of SARS-CoV-2 viral copies this assay can detect is 250 copies / mL. A negative result does not preclude SARS-CoV-2 infection and should not be used as the sole basis for treatment or other patient management decisions.  A negative result may occur with improper specimen collection / handling, submission of specimen other than  nasopharyngeal swab, presence of viral mutation(s) within the areas targeted by this assay, and inadequate number of viral copies (<250 copies / mL). A negative result must be combined with clinical observations, patient history, and epidemiological information.  Fact Sheet for Patients:   BoilerBrush.com.cy  Fact Sheet for Healthcare Providers: https://pope.com/  This test is not yet approved or  cleared by the [place] FDA and has been authorized for detection and/or diagnosis of SARS-CoV-2 by FDA under an Emergency Use Authorization (EUA).  This EUA will remain in effect (meaning this test can be used) for the duration of the COVID-19 declaration under Section 564(b)(1) of the Act, 21 U.S.C. section 360bbb-3(b)(1), unless the authorization is terminated or revoked sooner.  Performed at [facility], [address] [place], [place] [address]   Blood Culture (routine x 2)     Status: None (Preliminary result)   Collection Time: 08/09/19  3:46 AM   Specimen: BLOOD  Result Value Ref Range Status   Specimen Description BLOOD RIGHT [facility]  Final   Special Requests   Final    BOTTLES DRAWN AEROBIC AND ANAEROBIC Blood Culture adequate volume   Culture   Final    NO GROWTH 2 DAYS Performed at [facility], [address] [place], [place] [address]    Report Status PENDING  Incomplete  Blood Culture (routine x 2)     Status: None (Preliminary result)   Collection Time: 08/09/19  3:46 AM   Specimen: BLOOD  Result Value Ref Range Status   Specimen Description BLOOD LEFT HAND  Final   Special Requests   Final    BOTTLES DRAWN AEROBIC AND ANAEROBIC Blood Culture results may not be optimal due to an excessive volume of blood received in culture bottles   Culture   Final    NO GROWTH 2 DAYS Performed at [facility], [address] [place], [place] [address]    Report Status PENDING  Incomplete  Urine  Culture     Status: Abnormal   Collection Time: 08/09/19  9:14 AM   Specimen: Urine, Clean Catch  Result Value Ref Range Status   Specimen Description   Final    URINE, CLEAN CATCH Performed at [facility], [address] [place], [place] [address]    Special Requests   Final    Normal Performed at [facility], [address], [place], [place] [address]    Culture (A)  Final    10,000 COLONIES/mL MULTIPLE SPECIES PRESENT, SUGGEST RECOLLECTION   Report Status 08/11/2019 FINAL  Final  MRSA PCR Screening     Status: Abnormal   Collection Time: 08/09/19  5:18 PM   Specimen: Nasopharyngeal  Result Value Ref Range Status   MRSA by PCR POSITIVE (A) NEGATIVE Final    Comment:        The GeneXpert MRSA Assay (FDA approved for NASAL specimens only), is one component of a comprehensive MRSA colonization surveillance program. It is not intended to diagnose MRSA infection nor to guide or monitor treatment for MRSA infections. RESULT CALLED TO, READ BACK BY AND VERIFIED WITH: TRACY BENNETTE 08/09/19 @1932  BY ACR Performed at [facility], [address] [place] [place], [place] [address]      Studies: CT ANGIO CHEST PE W OR WO CONTRAST  Result Date: 08/09/2019 CLINICAL DATA:  59 year old male with history of chest pain for 1 day. EXAM: CT ANGIOGRAPHY CHEST WITH CONTRAST TECHNIQUE: Multidetector CT imaging of the chest was performed using the standard protocol during bolus administration of intravenous contrast. Multiplanar CT image reconstructions and MIPs were obtained to evaluate the vascular anatomy. CONTRAST:  OMNIPAQUE IOHEXOL 350 MG/ML SOLN COMPARISON:  Chest CT 03/04/2019. FINDINGS: Cardiovascular: Heart size is normal. There is no significant pericardial fluid, thickening or pericardial calcification. There is aortic atherosclerosis, as well as atherosclerosis of the great vessels of the mediastinum and the coronary arteries, including calcified  atherosclerotic plaque in the left main, left anterior descending and right coronary arteries. Mediastinum/Nodes: No pathologically enlarged mediastinal or hilar lymph nodes. Esophagus is unremarkable in appearance. No axillary lymphadenopathy. Lungs/Pleura: No acute consolidative airspace disease. No pleural effusions. No suspicious appearing pulmonary nodules or masses are noted. Upper Abdomen: Severe diffuse low attenuation throughout the visualized hepatic parenchyma, indicative of severe hepatic steatosis. Musculoskeletal: There are no aggressive appearing lytic or blastic lesions noted in the visualized portions of the skeleton. Review of the MIP images confirms the above findings. IMPRESSION: 1. No evidence of pulmonary embolism. No acute findings in the thorax to account for the patient's symptoms. 2. Aortic atherosclerosis, in addition to left main and 2 vessel coronary artery disease. Please note that although the presence of coronary artery calcium documents the presence of coronary artery disease, the severity of this disease and any potential stenosis cannot be assessed on this non-gated CT examination. Assessment for potential risk factor modification, dietary therapy or pharmacologic therapy may be warranted, if clinically indicated. 3. Severe hepatic steatosis. Aortic Atherosclerosis (ICD10-I70.0). Electronically Signed   By: Trudie Reed M.D.   On: 08/09/2019 15:11   MR CERVICAL SPINE WO CONTRAST  Result Date: 08/11/2019 CLINICAL DATA:  Neck pain with inability to elevate shoulders. Concern for cervical radiculopathy. Bilateral leg pain and ulcerations. EXAM: MRI CERVICAL SPINE WITHOUT CONTRAST TECHNIQUE: Multiplanar, multisequence MR imaging of the cervical spine was performed. No intravenous contrast was administered. COMPARISON:  None. FINDINGS: Despite efforts by the technologist and patient, moderate motion artifact is present on today's exam and could not be eliminated. This reduces  exam sensitivity and specificity. Image quality is also degraded by body habitus (patient weight 430 lb). Alignment: Straightening of the usual cervical lordosis. There is a minimal retrolisthesis at C6-7. No focal angulation. Vertebrae: There is increased inversion recovery and decreased T1 signal within the endplates at C6-7. There is intervening advanced disc space narrowing, and no hyperintensity within the intervening disc or endplate destruction. These findings are probably degenerative. No evidence of acute  fracture or bone destruction. Cord: Appears normal in signal and caliber. Posterior Fossa, vertebral arteries, paraspinal tissues: Visualized portions of the posterior fossa and paraspinal soft tissues appear unremarkable. Bilateral vertebral artery flow voids. Disc levels: Evaluation of the individual disc space levels is limited by motion artifact. The spinal canal appears widely patent at each level, and there is no cord deformity. At C3-4, there is moderate disc space narrowing with uncinate spurring contributing to mild-to-moderate foraminal narrowing bilaterally. No cord deformity. At C4-5, there is asymmetric uncinate spurring and facet hypertrophy on the right contributing to moderate right foraminal narrowing. Disc height is relatively maintained at C5-6. Mild facet hypertrophy and mild right-sided foraminal narrowing. C6-7: Spondylosis with posterior osteophytes covering diffusely bulging disc material. Mild to moderate foraminal narrowing bilaterally. IMPRESSION: 1. Examination is moderately degraded by motion and body habitus. Recommend plain film correlation. Consider further evaluation with CT. 2. Multilevel spondylosis as described. There is no significant spinal stenosis or cord deformity. There is mild-to-moderate foraminal narrowing bilaterally at C3-4, on the right at C4-5 and bilaterally at C6-7. 3. Endplate edema at L2-4 is probably degenerative. No evidence of acute fracture or bone  destruction. Electronically Signed   By: Carey Bullocks M.D.   On: 08/11/2019 13:58    Scheduled Meds: . aspirin EC  81 mg Oral Daily  . atorvastatin  40 mg Oral Daily  . Chlorhexidine Gluconate Cloth  6 each Topical Q0600  . folic acid  1 mg Oral Daily  . furosemide  40 mg Intravenous Daily  . insulin aspart  0-20 Units Subcutaneous TID WC  . insulin aspart  0-5 Units Subcutaneous QHS  . lisinopril  2.5 mg Oral Daily  . LORazepam  0-4 mg Intravenous Q12H  . methylPREDNISolone (SOLU-MEDROL) injection  40 mg Intravenous Daily  . metoprolol succinate  50 mg Oral Daily  . multivitamin with minerals  1 tablet Oral Daily  . mupirocin ointment  1 application Nasal BID  . thiamine  100 mg Oral Daily   Or  . thiamine  100 mg Intravenous Daily  . warfarin  3 mg Oral ONCE-1600  . Warfarin - Pharmacist Dosing Inpatient   Does not apply q1600   Continuous Infusions: . ceFEPime (MAXIPIME) IV 2 g (08/11/19 1223)  . vancomycin 1,500 mg (08/11/19 4010)    Assessment/Plan:  1. Clinical sepsis present on admission with bilateral lower extremity ulcerations with surrounding erythema consistent with cellulitis.  Patient on aggressive antibiotics with Maxipime and vancomycin. 2. Acute alcohol withdrawal.  Patient looks a little bit better today than yesterday.  Continue alcohol withdrawal protocol. 3. Neck pain and inability to elevate his shoulders yesterday.  Patient started on steroids unable to lift his arms up over his head.  MRI showing some degenerative changes. 4. Acute on chronic diastolic congestive heart failure.  Continue IV Lasix.  Also on metoprolol. 5. Chronic atrial fibrillation on Coumadin.  Pharmacy dosing Coumadin.  [place] for rate control. 6. Obstructive sleep apnea on BiPAP at night and with naps. 7. Morbid obesity with a BMI of 65.27.  Weight loss needed   Code Status:     Code Status Orders  (From admission, onward)         Start     Ordered   08/09/19 0615  Full  code  Continuous        08/09/19 0621        Code Status History    Date Active Date Inactive Code Status Order ID Comments User  Context   06/22/2019 1531 06/26/2019 1742 Full Code [id number]  Ivor Costa, MD Inpatient   03/04/2019 2314 03/05/2019 2105 Full Code [id number]  Orene Desanctis, DO ED   10/24/2018 0420 10/28/2018 1849 Full Code [id number]  Lance Coon, MD Inpatient   08/06/2018 1815 08/10/2018 2042 Full Code [id number]  Loletha Grayer, MD ED   07/18/2018 0716 07/23/2018 1827 Full Code [id number]  Harrie Foreman, MD ED   05/29/2018 1453 06/01/2018 1931 Full Code [id number]  Gorden Harms, MD Inpatient   03/17/2018 0042 03/25/2018 1554 Full Code [id number]  Vaughan Basta, MD ED   02/24/2018 0725 02/27/2018 1555 Full Code [id number]  Lance Coon, MD Inpatient   01/27/2018 1345 01/29/2018 1406 Full Code [id number]  Loletha Grayer, MD ED   01/17/2018 1330 01/19/2018 1818 Partial Code [id number]  Hillary Bow, MD Inpatient   01/16/2018 1427 01/17/2018 1330 Full Code [id number]  Dustin Flock, MD ED   Advance Care Planning Activity     Disposition Plan: Status is: Inpatient  Dispo: The patient is from: Home              Anticipated d/c is to: Home              Anticipated d/c date is: Likely will need a few more days here in the hospital              Patient currently IV antibiotics for clinical sepsis and lower extremity wounds.  Patient also going through alcohol withdrawal.  Consultants:  Cardiology  Antibiotics:  Vancomycin  Maxipime  Time spent: 28 minutes, case discussed with nursing staff.  [place]  [facility] [employer]

## 2019-08-11 NOTE — Progress Notes (Signed)
Patient Name: [name] [name] Date of Encounter: 08/11/2019  Hospital Problem List     Principal Problem:   Sepsis [facility]) Active Problems:   Chronic combined systolic and diastolic heart failure (HCC)   COPD (chronic obstructive pulmonary disease) (HCC)   Obstructive sleep apnea   Lymphedema   Diet-controlled diabetes mellitus (HCC)   HTN (hypertension)   Morbid obesity (HCC)   Chronic pain syndrome   Neck pain   Cellulitis of both lower extremities   Chronic anticoagulation (warfarin   COUMADIN)   NSTEMI (non-ST elevated myocardial infarction) (HCC)   Lower extremity ulceration, unspecified laterality, with fat layer exposed (HCC)   Alcoholic intoxication without complication (HCC)   Alcohol withdrawal syndrome without complication [facility])    Patient Profile     59 year old male with a past medical history significant for chronic atrial fibrillation, anticoagulated with warfarin, coronary artery disease, HFpEF, history of a DVT/PEwhile on Xarelto, history of a TBI, type 2 diabetes, COPD, obstructive sleep apnea, on BiPAP, and morbid obesitywho presented to the ED on 08/09/19 for an acute onset ofchest pain. Workup in the ED was significant for lactic acid of 3.6, ethanol level of 350, high sensitivity troponin of 49, 56 respectively, ECG revealing atrial fibrillation with rapid ventricular response with a intraventricular conduction delay, and chest xray revealing vascular congestion and cardiomegaly with minimal interstitial edema. The patient reports significant improvement in chest pain.  He still complains of leg pain.   Subjective   Multiple complaints of leg pain, sob and fatigue. Heavy etoh use as outpatient due to his pain.   Inpatient Medications     aspirin EC  81 mg Oral Daily   atorvastatin  40 mg Oral Daily   Chlorhexidine Gluconate Cloth  6 each Topical Q0600   folic acid  1 mg Oral Daily   furosemide  40 mg Intravenous Daily   insulin aspart  0-20  Units Subcutaneous TID WC   insulin aspart  0-5 Units Subcutaneous QHS   lisinopril  2.5 mg Oral Daily   LORazepam  0-4 mg Intravenous Q12H   methylPREDNISolone (SOLU-MEDROL) injection  40 mg Intravenous Daily   metoprolol succinate  50 mg Oral Daily   multivitamin with minerals  1 tablet Oral Daily   mupirocin ointment  1 application Nasal BID   thiamine  100 mg Oral Daily   Or   thiamine  100 mg Intravenous Daily   Warfarin - Pharmacist Dosing Inpatient   Does not apply q1600    Vital Signs    Vitals:   08/11/19 0625 08/11/19 0728 08/11/19 0730 08/11/19 0741  BP: (!) 156/87 (!) 139/93 107/83   Pulse: 94 99 (!) 125   Resp: 20 19  (!) 24  Temp: 98.6 F (37 C) 97.9 F (36.6 C)    TempSrc: Oral Oral    SpO2: 97% 100%    Weight:      Height:        Intake/Output Summary (Last 24 hours) at 08/11/2019 1020 Last data filed at 08/11/2019 0030 Gross per 24 hour  Intake 480 ml  Output 675 ml  Net -195 ml   Filed Weights   08/09/19 0322 08/09/19 1551 08/11/19 0437  Weight: (!) 195 kg (!) 190.5 kg (!) 194.7 kg    Physical Exam    GEN: Well nourished, well developed, in no acute distress.  HEENT: normal.  Neck: Supple, no JVD, carotid bruits, or masses. Cardiac: RRR, no murmurs, rubs, or gallops. No clubbing,  cyanosis, edema.  Radials/DP/PT 2+ and equal bilaterally.  Respiratory:  Respirations regular and unlabored, clear to auscultation bilaterally. GI: Soft, nontender, nondistended, BS + x 4. MS: no deformity or atrophy. Skin: warm and dry, no rash. Neuro:  Strength and sensation are intact. Psych: Normal affect.  Labs    CBC Recent Labs    08/09/19 0346  WBC 6.9  NEUTROABS 4.5  HGB 12.5*  HCT 39.0  MCV 89.9  PLT 238   Basic Metabolic Panel Recent Labs    30/86/57 0346 08/09/19 0346 08/09/19 0914 08/10/19 0520  NA 136  --   --  137  K 3.7  --   --  3.7  CL 91*  --   --  94*  CO2 28  --   --  31  GLUCOSE 134*  --   --  115*  BUN 12  --   --   11  CREATININE 1.03  --   --  0.89  CALCIUM 8.0*  --   --  8.1*  MG 2.2   < > 2.1 1.7  PHOS 2.0*  --   --   --    < > = values in this interval not displayed.   Liver Function Tests Recent Labs    08/09/19 0346  AST 77*  ALT 44  ALKPHOS 107  BILITOT 0.8  PROT 8.2*  ALBUMIN 4.0   Recent Labs    08/09/19 0346  LIPASE 41   Cardiac Enzymes Recent Labs    08/09/19 0346  CKTOTAL 349   BNP Recent Labs    08/09/19 0346  BNP 67.1   D-Dimer No results for input(s): DDIMER in the last 72 hours. Hemoglobin A1C No results for input(s): HGBA1C in the last 72 hours. Fasting Lipid Panel No results for input(s): CHOL, HDL, LDLCALC, TRIG, CHOLHDL, LDLDIRECT in the last 72 hours. Thyroid Function Tests No results for input(s): TSH, T4TOTAL, T3FREE, THYROIDAB in the last 72 hours.  Invalid input(s): FREET3  Telemetry    Atrial fibrillation with variable vr   ECG    afib with variale vr  Radiology    DG Ankle 2 Views Left  Result Date: 08/09/2019 CLINICAL DATA:  Acute on chronic wounds EXAM: LEFT ANKLE - 2 VIEW COMPARISON:  None. FINDINGS: Extensive severe edematous changes of the lower extremities with some benign-appearing soft tissue mineralization, likely sequela of chronic venous stasis. Atherosclerotic calcifications are present in the soft tissues. No radiographically evident site of focal ulceration is seen. No worrisome destructive, erosive, subcortical lucent or periostitis changes to suggest features of osteomyelitis. Mild to moderate degenerative changes present in the ankle, mid and hindfoot. Midfoot and hindfoot alignment is grossly preserved though incompletely assessed on nondedicated, nonweightbearing films. Remote posttraumatic deformity of the proximal tibial and distal fibular diaphyses. Bidirectional calcaneal spurs. IMPRESSION: 1. Extensive severe edematous changes of the lower extremities with some benign-appearing soft tissue mineralization, likely  sequela of chronic venous stasis. 2. No radiographically evident site of focal ulceration nor convincing radiographic evidence of acute osteomyelitis. If persisting concern, MRI is more sensitive and specific for early changes. 3. Degenerative changes as above. Electronically Signed   By: Kreg Shropshire M.D.   On: 08/09/2019 04:59   DG Ankle 2 Views Right  Result Date: 08/09/2019 CLINICAL DATA:  Acute on chronic wounds, concern for osteomyelitis EXAM: RIGHT ANKLE - 2 VIEW COMPARISON:  None. FINDINGS: Diffuse edematous changes of the lower extremity with extensive vascular calcifications. No discernible focal soft tissue  ulceration. No destructive, erosive, subcortical lucent or periostitis changes to suggest a discernible site of acute osteomyelitis. Corticated os trigonum posterior to the talus. Os peroneum noted along the arch. Bidirectional calcaneal spurs. Moderate to severe arthrosis about the ankle with spurring along the medial and lateral malleoli. Mild to moderate degenerative changes in the mid and hindfoot. Midfoot and hindfoot alignment is grossly preserved though incompletely assessed on nonweightbearing films. IMPRESSION: 1. No discernible focal soft tissue ulceration nor convincing site radiographically evident osteomyelitis. If there is continued clinical concern, MRI is more sensitive and specific. 2. Diffuse edematous changes of the lower extremity with extensive vascular calcifications. 3. Degenerative changes, as above. Electronically Signed   By: Kreg Shropshire M.D.   On: 08/09/2019 04:52   CT ANGIO CHEST PE W OR WO CONTRAST  Result Date: 08/09/2019 CLINICAL DATA:  59 year old male with history of chest pain for 1 day. EXAM: CT ANGIOGRAPHY CHEST WITH CONTRAST TECHNIQUE: Multidetector CT imaging of the chest was performed using the standard protocol during bolus administration of intravenous contrast. Multiplanar CT image reconstructions and MIPs were obtained to evaluate the vascular  anatomy. CONTRAST:  OMNIPAQUE IOHEXOL 350 MG/ML SOLN COMPARISON:  Chest CT 03/04/2019. FINDINGS: Cardiovascular: Heart size is normal. There is no significant pericardial fluid, thickening or pericardial calcification. There is aortic atherosclerosis, as well as atherosclerosis of the great vessels of the mediastinum and the coronary arteries, including calcified atherosclerotic plaque in the left main, left anterior descending and right coronary arteries. Mediastinum/Nodes: No pathologically enlarged mediastinal or hilar lymph nodes. Esophagus is unremarkable in appearance. No axillary lymphadenopathy. Lungs/Pleura: No acute consolidative airspace disease. No pleural effusions. No suspicious appearing pulmonary nodules or masses are noted. Upper Abdomen: Severe diffuse low attenuation throughout the visualized hepatic parenchyma, indicative of severe hepatic steatosis. Musculoskeletal: There are no aggressive appearing lytic or blastic lesions noted in the visualized portions of the skeleton. Review of the MIP images confirms the above findings. IMPRESSION: 1. No evidence of pulmonary embolism. No acute findings in the thorax to account for the patient's symptoms. 2. Aortic atherosclerosis, in addition to left main and 2 vessel coronary artery disease. Please note that although the presence of coronary artery calcium documents the presence of coronary artery disease, the severity of this disease and any potential stenosis cannot be assessed on this non-gated CT examination. Assessment for potential risk factor modification, dietary therapy or pharmacologic therapy may be warranted, if clinically indicated. 3. Severe hepatic steatosis. Aortic Atherosclerosis (ICD10-I70.0). Electronically Signed   By: Trudie Reed M.D.   On: 08/09/2019 15:11   DG Chest Port 1 View  Result Date: 08/09/2019 CLINICAL DATA:  Chest pain, bright red blood per rectum EXAM: PORTABLE CHEST 1 VIEW COMPARISON:  Radiograph  06/22/2019, CT 03/04/2019 FINDINGS: Low lung volumes and central vascular congestion. Cardiomegaly is similar to prior. Hazy and interstitial opacities in the lungs without focal consolidation. No pneumothorax or visible effusion. No acute osseous or soft tissue abnormality. Telemetry leads overlie the chest. IMPRESSION: Low volumes and atelectasis. Vascular congestion and cardiomegaly with at most minimal interstitial edema. Electronically Signed   By: Kreg Shropshire M.D.   On: 08/09/2019 04:49    Assessment & Plan    Principal Problem:   Sepsis (HCC) Active Problems:   Chronic combined systolic and diastolic heart failure (HCC)   COPD (chronic obstructive pulmonary disease) (HCC)   Obstructive sleep apnea   Lymphedema   Diet-controlled diabetes mellitus (HCC)   HTN (hypertension)   Morbid obesity (HCC)  Chronic pain syndrome   Cellulitis of both lower extremities   Chronic anticoagulation (warfarin   COUMADIN)   Elevated troponin   Lower extremity ulceration, unspecified laterality, with fat layer exposed ([place])   Alcoholic intoxication without complication ([facility])    1.  Chest pain              -Symptoms have significantly improved; Chest CT negative for a PE              -Troponin mildly elevated x 2, 49 and 56 respectively; likely demand ischemia in the setting of atrial fibrillation with RVR, sepsis Does not appear to be ACS. Would not consider this a NSTEMI but elevated troponin due to demand.             -No plan for further invasive workup; heparin discontinued   2.  Atrial fibrillation              -Currently in atrial fibrillation with varying ventricular response; ranging from 90s at rest and low 100s with movement/ambulation              -Heparin d/c, warfarin resumed . INR goal 2-3             -Would recommend to continue with metoprolol 50mg  daily for rate control   3.  Morbid obesity              -Had a long discussion about the importance of weight loss through  dietary modifications; weight loss necessary prior to consideration for right hip replacement    Signed, Javier Docker. Tyliyah Mcmeekin MD 08/11/2019, 10:20 AM  Pager: [phone]

## 2019-08-12 LAB — BASIC METABOLIC PANEL
Anion gap: 12 (ref 5–15)
BUN: 14 mg/dL (ref 6–20)
CO2: 27 mmol/L (ref 22–32)
Calcium: 9.4 mg/dL (ref 8.9–10.3)
Chloride: 95 mmol/L — ABNORMAL LOW (ref 98–111)
Creatinine, Ser: 0.92 mg/dL (ref 0.61–1.24)
GFR calc Af Amer: >60 mL/min (ref >60)
GFR calc non Af Amer: >60 mL/min (ref >60)
Glucose, Bld: 172 mg/dL — ABNORMAL HIGH (ref 70–99)
Potassium: 4.3 mmol/L (ref 3.5–5.1)
Sodium: 134 mmol/L — ABNORMAL LOW (ref 135–145)

## 2019-08-12 LAB — PROTIME-INR
INR: 2.7 — ABNORMAL HIGH (ref 0.8–1.2)
Prothrombin Time: 27.4 seconds — ABNORMAL HIGH (ref 11.4–15.2)

## 2019-08-12 LAB — GLUCOSE, CAPILLARY
Glucose-Capillary: 167 mg/dL — ABNORMAL HIGH (ref 70–99)
Glucose-Capillary: 204 mg/dL — ABNORMAL HIGH (ref 70–99)
Glucose-Capillary: 140 mg/dL — ABNORMAL HIGH (ref 70–99)

## 2019-08-12 MED ORDER — WARFARIN SODIUM 3 MG PO TABS
3.0000 mg | ORAL_TABLET | Freq: Once | ORAL | Status: AC
  Administered 2019-08-12: 3 mg via ORAL
  Filled 2019-08-12: qty 1

## 2019-08-12 MED ORDER — FUROSEMIDE 40 MG PO TABS
40.0000 mg | ORAL_TABLET | Freq: Every day | ORAL | Status: DC
  Administered 2019-08-13: 40 mg via ORAL
  Filled 2019-08-12: qty 1

## 2019-08-12 MED ORDER — PREDNISONE 10 MG PO TABS
30.0000 mg | ORAL_TABLET | Freq: Every day | ORAL | Status: DC
  Administered 2019-08-13 – 2019-08-14 (×2): 30 mg via ORAL
  Filled 2019-08-12 (×2): qty 3

## 2019-08-12 NOTE — Consult Note (Signed)
ANTICOAGULATION CONSULT NOTE  Pharmacy Consult for Warfarin Dosing  Indication: atrial fibrillation  No Known Allergies  Patient Measurements: Height: 5\' 8"  (172.7 cm) Weight: (!) 192.8 kg (425 lb 1.6 oz) IBW/kg (Calculated) : 68.4  Vital Signs: Temp: 98.1 F (36.7 C) (06/27 0826) BP: 127/77 (06/27 0826) Pulse Rate: 106 (06/27 0826)  Labs: Recent Labs    08/09/19 1254 08/09/19 1354 08/10/19 0520 08/11/19 1022 08/12/19 0647 08/12/19 1104  HGB  --   --   --  12.0*  --   --   HCT  --   --   --  36.0*  --   --   PLT  --   --   --  149*  --   --   LABPROT  --   --  28.4* 28.6*  --  27.4*  INR  --   --  2.8* 2.8*  --  2.7*  HEPARINUNFRC 0.23*  --   --   --   --   --   CREATININE  --   --  0.89 0.81 0.92  --   TROPONINIHS  --  56*  --   --   --   --     Estimated Creatinine Clearance: 146.3 mL/min (by C-G formula based on SCr of 0.92 mg/dL).   Medical History: Past Medical History:  Diagnosis Date  . Anxiety   . Arthritis   . Asthma   . Brain damage   . Chronic pain of both knees 07/13/2018  . Clotting disorder (HCC)   . COPD (chronic obstructive pulmonary disease) (HCC)   . Depression   . HFrEF (heart failure with reduced ejection fraction) (HCC)    a. 03/2018 Echo: EF 25-30%, diff HK. Mod LAE.  04/2018 History of DVT (deep vein thrombosis)   . History of pulmonary embolism    a. Chronic coumadin.  [name] Hypertension   . MI (myocardial infarction) (HCC)   . Morbid obesity (HCC)   . Neck pain 07/13/2018  . NICM (nonischemic cardiomyopathy) (HCC)    a. s/p Cath x 3 - reportedly nl cors. Last cath 2019 in [place]; b. a. 03/2018 Echo: EF 25-30%, diff HK.  04/2018 Persistent atrial fibrillation (HCC)    a. 03/2018 s/p DCCV; b. CHA2DS2VASc = 1-->Xarelto (later changed to warfarin); c. 05/2018 recurrent afib-->Amio initiated.  . Sleep apnea     Assessment: Pharmacy consulted for warfarin dosing and monitoring for 59 yo male with PMH of A. Fib. INR therapeutic on admission. DDI: cefepime,  asa, APAP.   Home Regimen: Warfarin 5mg    DATE INR DOSE 6/24 2.0 5 mg 6/25 2.8 2.5 mg  6/26 2.8 3 mg 6/27 2.7  Goal of Therapy:  INR: 2-3   Plan:  INR therapeutic. Will give Warfarin 3 mg again tonight. Daily INR ordered. CBC at least every 3 days. No major DDIs(abx).   7/26, PharmD, BCPS Clinical Pharmacist 08/12/2019 11:44 AM

## 2019-08-12 NOTE — TOC Progression Note (Signed)
Transition of Care [facility]) - Progression Note    Patient Details  Name: [name] [name] MRN: [id number] Date of Birth: 1960-03-29  Transition of Care [facility]) CM/SW Contact  Ashley Royalty Lutricia Feil, RN Phone Number:[phone] 08/12/2019, 5:27 PM  Clinical Narrative:    [facility] team attempted a bedside visit however pt on a CPAP device and requested a visit at another time.       Expected Discharge Plan and Services                                                 Social Determinants of Health (SDOH) Interventions    Readmission Risk Interventions Readmission Risk Prevention Plan 01/25/2019 10/25/2018 08/08/2018  Transportation Screening Complete Complete Complete  PCP or Specialist Appt within 3-5 Days - Complete Complete  HRI or Home Care Consult - Complete Complete  Social Work Consult for Recovery Care Planning/Counseling - Complete Patient refused  Palliative Care Screening - Not Applicable Not Applicable  Medication Review [employer]) Complete Complete Complete  PCP or Specialist appointment within 3-5 days of discharge Complete - -  HRI or Home Care Consult Complete - -  SW Recovery Care/Counseling Consult Complete - -  Palliative Care Screening Not Applicable - -  Skilled Nursing Facility Not Applicable - -

## 2019-08-12 NOTE — Plan of Care (Signed)
  Problem: Pain Managment: Goal: General experience of comfort will improve Outcome: Progressing   Problem: Clinical Measurements: Goal: Will remain free from infection Outcome: Progressing   Problem: Fluid Volume: Goal: Hemodynamic stability will improve Outcome: Progressing

## 2019-08-12 NOTE — Progress Notes (Signed)
Patient ID: [name] [name], male   DOB: 1960/05/01, 59 y.o.   MRN: [id number] [facility] PROGRESS NOTE  [name] [name] LKG:[id number] DOB: 1960/10/13 DOA: 08/09/2019 PCP: Berniece Pap, FNP  HPI/Subjective: Patient admitted with lower extremity pain and redness and infection.  Patient still complaining of neck pain.  Able to move around his arms better than a few days ago.  Patient still feels weak and shaky.  Objective: Vitals:   08/12/19 1252 08/12/19 1253  BP: (!) 84/50   Pulse: (!) 104 65  Resp: 18   Temp: 98.4 F (36.9 C)   SpO2: 98% 97%    Intake/Output Summary (Last 24 hours) at 08/12/2019 1529 Last data filed at 08/12/2019 1357 Gross per 24 hour  Intake 1085.44 ml  Output 1550 ml  Net -464.56 ml   Filed Weights   08/11/19 0437 08/12/19 0028 08/12/19 0517  Weight: (!) 194.7 kg (!) 192.8 kg (!) 192.8 kg    ROS: Review of Systems  Constitutional: Positive for malaise/fatigue.  Respiratory: Positive for shortness of breath.   Cardiovascular: Negative for chest pain.  Gastrointestinal: Negative for abdominal pain.  Musculoskeletal: Positive for joint pain and neck pain.   Exam: Physical Exam HENT:     Nose: No mucosal edema.     Mouth/Throat:     Pharynx: No oropharyngeal exudate.  Eyes:     General: Lids are normal.     Pupils: Pupils are equal, round, and reactive to light.  Cardiovascular:     Rate and Rhythm: Normal rate. Rhythm irregularly irregular.     Heart sounds: Normal heart sounds, S1 normal and S2 normal.  Pulmonary:     Breath sounds: No decreased breath sounds, wheezing, rhonchi or rales.  Abdominal:     Palpations: Abdomen is soft.     Tenderness: There is no abdominal tenderness.  Musculoskeletal:     Right lower leg: Swelling present.     Left lower leg: Swelling present.  Skin:    General: Skin is warm.     Comments: Lower extremities covered by Unna boots  Neurological:     Mental Status: He is alert.     Comments: Very  slight tremor.  Able to lift arms up overhead.       Data Reviewed: Basic Metabolic Panel: Recent Labs  Lab 08/09/19 0346 08/09/19 0914 08/10/19 0520 08/11/19 1022 08/12/19 0647  NA 136  --  137 132* 134*  K 3.7  --  3.7 3.9 4.3  CL 91*  --  94* 92* 95*  CO2 28  --  31 26 27   GLUCOSE 134*  --  115* 170* 172*  BUN 12  --  11 11 14   CREATININE 1.03  --  0.89 0.81 0.92  CALCIUM 8.0*  --  8.1* 8.8* 9.4  MG 2.2 2.1 1.7  --   --   PHOS 2.0*  --   --   --   --    Liver Function Tests: Recent Labs  Lab 08/09/19 0346  AST 77*  ALT 44  ALKPHOS 107  BILITOT 0.8  PROT 8.2*  ALBUMIN 4.0   Recent Labs  Lab 08/09/19 0346  LIPASE 41   Recent Labs  Lab 08/09/19 0346  AMMONIA <9*   CBC: Recent Labs  Lab 08/09/19 0346 08/11/19 1022  WBC 6.9 7.9  NEUTROABS 4.5  --   HGB 12.5* 12.0*  HCT 39.0 36.0*  MCV 89.9 88.0  PLT 238 149*   Cardiac Enzymes: Recent Labs  Lab 08/09/19 0346  CKTOTAL 349   BNP (last 3 results) Recent Labs    05/02/19 1546 06/22/19 0852 08/09/19 0346  BNP 44.1 118.0* 67.1    CBG: Recent Labs  Lab 08/10/19 1636 08/11/19 0804 08/11/19 1214 08/11/19 1627 08/12/19 1230  GLUCAP 115* 172* 148* 222* 167*    Recent Results (from the past 240 hour(s))  SARS Coronavirus 2 by RT PCR (hospital order, performed in [facility] hospital lab) Nasopharyngeal Nasopharyngeal Swab     Status: None   Collection Time: 08/09/19  3:46 AM   Specimen: Nasopharyngeal Swab  Result Value Ref Range Status   SARS Coronavirus 2 NEGATIVE NEGATIVE Final    Comment: (NOTE) SARS-CoV-2 target nucleic acids are NOT DETECTED.  The SARS-CoV-2 RNA is generally detectable in upper and lower respiratory specimens during the acute phase of infection. The lowest concentration of SARS-CoV-2 viral copies this assay can detect is 250 copies / mL. A negative result does not preclude SARS-CoV-2 infection and should not be used as the sole basis for treatment or  other patient management decisions.  A negative result may occur with improper specimen collection / handling, submission of specimen other than nasopharyngeal swab, presence of viral mutation(s) within the areas targeted by this assay, and inadequate number of viral copies (<250 copies / mL). A negative result must be combined with clinical observations, patient history, and epidemiological information.  Fact Sheet for Patients:   BoilerBrush.com.cy  Fact Sheet for Healthcare Providers: https://pope.com/  This test is not yet approved or  cleared by the [place] FDA and has been authorized for detection and/or diagnosis of SARS-CoV-2 by FDA under an Emergency Use Authorization (EUA).  This EUA will remain in effect (meaning this test can be used) for the duration of the COVID-19 declaration under Section 564(b)(1) of the Act, 21 U.S.C. section 360bbb-3(b)(1), unless the authorization is terminated or revoked sooner.  Performed at [facility], [address] [place], [place] [address]   Blood Culture (routine x 2)     Status: None (Preliminary result)   Collection Time: 08/09/19  3:46 AM   Specimen: BLOOD  Result Value Ref Range Status   Specimen Description BLOOD [facility]  Final   Special Requests   Final    BOTTLES DRAWN AEROBIC AND ANAEROBIC Blood Culture adequate volume   Culture   Final    NO GROWTH 3 DAYS Performed at [facility], [address] [place], [place] [address]    Report Status PENDING  Incomplete  Blood Culture (routine x 2)     Status: None (Preliminary result)   Collection Time: 08/09/19  3:46 AM   Specimen: BLOOD  Result Value Ref Range Status   Specimen Description BLOOD LEFT HAND  Final   Special Requests   Final    BOTTLES DRAWN AEROBIC AND ANAEROBIC Blood Culture results may not be optimal due to an excessive volume of blood received in culture bottles   Culture   Final     NO GROWTH 3 DAYS Performed at [facility], [address] [place], [place] [address]    Report Status PENDING  Incomplete  Urine Culture     Status: Abnormal   Collection Time: 08/09/19  9:14 AM   Specimen: Urine, Clean Catch  Result Value Ref Range Status   Specimen Description   Final    URINE, CLEAN CATCH Performed at [facility], [address] [place], [place] [address]    Special Requests  Final    Normal Performed at [facility], [address] [place], [place] [address]    Culture (A)  Final    10,000 COLONIES/mL MULTIPLE SPECIES PRESENT, SUGGEST RECOLLECTION   Report Status 08/11/2019 FINAL  Final  MRSA PCR Screening     Status: Abnormal   Collection Time: 08/09/19  5:18 PM   Specimen: Nasopharyngeal  Result Value Ref Range Status   MRSA by PCR POSITIVE (A) NEGATIVE Final    Comment:        The GeneXpert MRSA Assay (FDA approved for NASAL specimens only), is one component of a comprehensive MRSA colonization surveillance program. It is not intended to diagnose MRSA infection nor to guide or monitor treatment for MRSA infections. RESULT CALLED TO, READ BACK BY AND VERIFIED WITH: TRACY BENNETTE 08/09/19 @1932  BY ACR Performed at [facility], [address] [place], [place] [address]      Studies: CT CERVICAL SPINE WO CONTRAST  Result Date: 08/11/2019 CLINICAL DATA:  Acute generalized neck pain. EXAM: CT CERVICAL SPINE WITHOUT CONTRAST TECHNIQUE: Multidetector CT imaging of the cervical spine was performed without intravenous contrast. Multiplanar CT image reconstructions were also generated. COMPARISON:  Cervical MRI of the same date. FINDINGS: Compared with the previous MRI, the image quality of this examination is better, although the reformatted images through the lower cervical spine remain limited by body habitus. Alignment: Straightening of the usual cervical lordosis with 3 mm of degenerative anterolisthesis at  C4-5 and a slight retrolisthesis at C6-7. Skull base and vertebrae: No evidence of acute fracture or traumatic subluxation. Marked disc space narrowing and endplate sclerosis at C6-7 with possible interbody ankylosis. In correlation with the MRI which showed no discal hyperintensity, no evidence of discitis or osteomyelitis. Soft tissues and spinal canal: No prevertebral fluid or swelling. No visible canal hematoma. Disc levels:  No significant findings at C1-2 or C2-3. C3-4: Loss of disc height with uncinate spurring asymmetric to the left and mild bilateral facet hypertrophy. Moderate left greater than right osseous foraminal narrowing. C4-5: Bilateral facet hypertrophy accounts for the anterolisthesis. There is mild uncinate spurring with mild right-greater-than-left foraminal narrowing. C5-6: Minimal uncinate spurring. No significant foraminal narrowing. C6-7: Chronic loss of disc height with endplate sclerosis and mild to moderate foraminal narrowing bilaterally. C7-T1: Bilateral facet hypertrophy contributes to mild foraminal narrowing bilaterally. Upper chest: No significant findings. Other: Bilateral carotid atherosclerosis. IMPRESSION: 1. No evidence of acute cervical spine fracture, traumatic subluxation or static signs of instability. 2. Multilevel cervical spondylosis as described. Moderate left greater than right osseous foraminal narrowing at C3-4. 3. Advanced disc space narrowing and possible ankylosis at C6-7. Electronically Signed   By: Carey Bullocks M.D.   On: 08/11/2019 16:03   MR CERVICAL SPINE WO CONTRAST  Result Date: 08/11/2019 CLINICAL DATA:  Neck pain with inability to elevate shoulders. Concern for cervical radiculopathy. Bilateral leg pain and ulcerations. EXAM: MRI CERVICAL SPINE WITHOUT CONTRAST TECHNIQUE: Multiplanar, multisequence MR imaging of the cervical spine was performed. No intravenous contrast was administered. COMPARISON:  None. FINDINGS: Despite efforts by the  technologist and patient, moderate motion artifact is present on today's exam and could not be eliminated. This reduces exam sensitivity and specificity. Image quality is also degraded by body habitus (patient weight 430 lb). Alignment: Straightening of the usual cervical lordosis. There is a minimal retrolisthesis at C6-7. No focal angulation. Vertebrae: There is increased inversion recovery and decreased T1 signal within the endplates at C6-7. There is intervening advanced disc space  narrowing, and no hyperintensity within the intervening disc or endplate destruction. These findings are probably degenerative. No evidence of acute fracture or bone destruction. Cord: Appears normal in signal and caliber. Posterior Fossa, vertebral arteries, paraspinal tissues: Visualized portions of the posterior fossa and paraspinal soft tissues appear unremarkable. Bilateral vertebral artery flow voids. Disc levels: Evaluation of the individual disc space levels is limited by motion artifact. The spinal canal appears widely patent at each level, and there is no cord deformity. At C3-4, there is moderate disc space narrowing with uncinate spurring contributing to mild-to-moderate foraminal narrowing bilaterally. No cord deformity. At C4-5, there is asymmetric uncinate spurring and facet hypertrophy on the right contributing to moderate right foraminal narrowing. Disc height is relatively maintained at C5-6. Mild facet hypertrophy and mild right-sided foraminal narrowing. C6-7: Spondylosis with posterior osteophytes covering diffusely bulging disc material. Mild to moderate foraminal narrowing bilaterally. IMPRESSION: 1. Examination is moderately degraded by motion and body habitus. Recommend plain film correlation. Consider further evaluation with CT. 2. Multilevel spondylosis as described. There is no significant spinal stenosis or cord deformity. There is mild-to-moderate foraminal narrowing bilaterally at C3-4, on the right at  C4-5 and bilaterally at C6-7. 3. Endplate edema at A7-6 is probably degenerative. No evidence of acute fracture or bone destruction. Electronically Signed   By: Carey Bullocks M.D.   On: 08/11/2019 13:58    Scheduled Meds: . aspirin EC  81 mg Oral Daily  . atorvastatin  40 mg Oral Daily  . Chlorhexidine Gluconate Cloth  6 each Topical Q0600  . folic acid  1 mg Oral Daily  . furosemide  40 mg Intravenous Daily  . insulin aspart  0-20 Units Subcutaneous TID WC  . insulin aspart  0-5 Units Subcutaneous QHS  . lisinopril  2.5 mg Oral Daily  . LORazepam  0-4 mg Intravenous Q12H  . methylPREDNISolone (SOLU-MEDROL) injection  40 mg Intravenous Daily  . metoprolol succinate  50 mg Oral Daily  . multivitamin with minerals  1 tablet Oral Daily  . mupirocin ointment  1 application Nasal BID  . thiamine  100 mg Oral Daily   Or  . thiamine  100 mg Intravenous Daily  . traZODone  150 mg Oral QHS  . warfarin  3 mg Oral ONCE-1600  . Warfarin - Pharmacist Dosing Inpatient   Does not apply q1600   Continuous Infusions: . ceFEPime (MAXIPIME) IV 2 g (08/12/19 1409)  . vancomycin 1,500 mg (08/12/19 0848)    Assessment/Plan:  1. Clinical sepsis present on admission with bilateral lower extremity ulcerations and surrounding erythema and cellulitis.  Patient on Maxipime and vancomycin from admission.  Patient has Unna boots on.  When the nurses change the Unna boots tomorrow I will take a look at the lower extremities if I am notified when they are changing it. 2. Acute alcohol withdrawal.  Patient has improved during the hospital course.  Very little tremor at this point.  Patient on alcohol withdrawal protocol. 3. Neck pain and shoulder pain.  MRI and CT scan did not show anything that I need to get the neurosurgeons involved.  Continue steroids.  Switch Solu-Medrol over to prednisone for tomorrow.  Continued physical therapy and occupational therapy evaluation. 4. Acute on chronic diastolic congestive  heart failure.  With blood pressure being a little low get rid of lisinopril.  Switch Lasix to p.o.  Continue metoprolol. 5. Chronic atrial fibrillation on Coumadin.  INR therapeutic.  Metoprolol for rate control. 6. Obstructive sleep apnea on  BiPAP at night and with naps. 7. Morbid obesity with a BMI of 64.64  Code Status:     Code Status Orders  (From admission, onward)         Start     Ordered   08/09/19 0615  Full code  Continuous        08/09/19 0621        Code Status History    Date Active Date Inactive Code Status Order ID Comments User Context   06/22/2019 1531 06/26/2019 1742 Full Code [id number]  Ivor Costa, MD Inpatient   03/04/2019 2314 03/05/2019 2105 Full Code [id number]  Orene Desanctis, DO ED   10/24/2018 0420 10/28/2018 1849 Full Code [id number]  Lance Coon, MD Inpatient   08/06/2018 1815 08/10/2018 2042 Full Code [id number]  Loletha Grayer, MD ED   07/18/2018 0716 07/23/2018 1827 Full Code [id number]  Harrie Foreman, MD ED   05/29/2018 1453 06/01/2018 1931 Full Code [id number]  Salary, Avel Peace, MD Inpatient   03/17/2018 0042 03/25/2018 1554 Full Code [id number]  Vaughan Basta, MD ED   02/24/2018 0725 02/27/2018 1555 Full Code [id number]  Lance Coon, MD Inpatient   01/27/2018 1345 01/29/2018 1406 Full Code [id number]  Loletha Grayer, MD ED   01/17/2018 1330 01/19/2018 1818 Partial Code [id number]  Hillary Bow, MD Inpatient   01/16/2018 1427 01/17/2018 1330 Full Code [id number]  Dustin Flock, MD ED   Advance Care Planning Activity     Disposition Plan: Status is: Inpatient  Dispo: The patient is from: Home              Anticipated d/c is to: Home              Anticipated d/c date is: Potential 08/13/2019 versus 08/14/2019              Patient currently being treated for clinical sepsis with lower extremity cellulitis on IV antibiotics and also being treated for acute alcohol withdrawal.  Consultants:  Cardiology  Antibiotics:  Vancomycin  Maxipime  Time  spent: 26 minutes  [place]

## 2019-08-13 DIAGNOSIS — J9621 Acute and chronic respiratory failure with hypoxia: Secondary | ICD-10-CM

## 2019-08-13 LAB — BASIC METABOLIC PANEL
Anion gap: 8 (ref 5–15)
BUN: 21 mg/dL — ABNORMAL HIGH (ref 6–20)
CO2: 31 mmol/L (ref 22–32)
Calcium: 9.4 mg/dL (ref 8.9–10.3)
Chloride: 98 mmol/L (ref 98–111)
Creatinine, Ser: 0.87 mg/dL (ref 0.61–1.24)
GFR calc Af Amer: >60 mL/min (ref >60)
GFR calc non Af Amer: >60 mL/min (ref >60)
Glucose, Bld: 111 mg/dL — ABNORMAL HIGH (ref 70–99)
Potassium: 4.3 mmol/L (ref 3.5–5.1)
Sodium: 137 mmol/L (ref 135–145)

## 2019-08-13 LAB — PROTIME-INR
INR: 2.4 — ABNORMAL HIGH (ref 0.8–1.2)
Prothrombin Time: 25.2 seconds — ABNORMAL HIGH (ref 11.4–15.2)

## 2019-08-13 LAB — GLUCOSE, CAPILLARY
Glucose-Capillary: 94 mg/dL (ref 70–99)
Glucose-Capillary: 144 mg/dL — ABNORMAL HIGH (ref 70–99)
Glucose-Capillary: 126 mg/dL — ABNORMAL HIGH (ref 70–99)
Glucose-Capillary: 149 mg/dL — ABNORMAL HIGH (ref 70–99)
Glucose-Capillary: 177 mg/dL — ABNORMAL HIGH (ref 70–99)
Glucose-Capillary: 151 mg/dL — ABNORMAL HIGH (ref 70–99)
Glucose-Capillary: 170 mg/dL — ABNORMAL HIGH (ref 70–99)

## 2019-08-13 LAB — SEDIMENTATION RATE: Sed Rate: 18 mm/hr (ref 0–20)

## 2019-08-13 LAB — VANCOMYCIN, TROUGH: Vancomycin Tr: 17 ug/mL (ref 15–20)

## 2019-08-13 MED ORDER — WARFARIN SODIUM 3 MG PO TABS
3.0000 mg | ORAL_TABLET | Freq: Once | ORAL | Status: AC
  Administered 2019-08-13: 3 mg via ORAL
  Filled 2019-08-13: qty 1

## 2019-08-13 MED ORDER — SULFAMETHOXAZOLE-TRIMETHOPRIM 800-160 MG PO TABS
1.0000 | ORAL_TABLET | Freq: Two times a day (BID) | ORAL | Status: DC
  Administered 2019-08-13 – 2019-08-14 (×2): 1 via ORAL
  Filled 2019-08-13 (×4): qty 1

## 2019-08-13 MED ORDER — SODIUM CHLORIDE 0.9 % IV SOLN
INTRAVENOUS | Status: DC | PRN
  Administered 2019-08-13: 500 mL via INTRAVENOUS

## 2019-08-13 MED ORDER — TORSEMIDE 20 MG PO TABS
40.0000 mg | ORAL_TABLET | Freq: Two times a day (BID) | ORAL | Status: DC
  Administered 2019-08-13: 40 mg via ORAL
  Filled 2019-08-13: qty 2

## 2019-08-13 MED ORDER — WARFARIN SODIUM 4 MG PO TABS
4.0000 mg | ORAL_TABLET | Freq: Once | ORAL | Status: DC
  Filled 2019-08-13: qty 1

## 2019-08-13 NOTE — Progress Notes (Signed)
Patient ID: [name] [name], male   DOB: 05-Nov-1960, 59 y.o.   MRN: [id number] [facility] PROGRESS NOTE  [name] [name] WUJ:[id number] DOB: 1960/04/21 DOA: 08/09/2019 PCP: Berniece Pap, FNP  HPI/Subjective: Admitted with lower extremity cellulitis and ulcerations.  Patient not feeling that well today.  Less shakiness than when he came in.  Pulse ox dropped down at rest on room air earlier today.  Objective: Vitals:   08/13/19 0759 08/13/19 1131  BP: 120/71 (!) 139/99  Pulse: (!) 55 70  Resp: 18 18  Temp: 98.4 F (36.9 C) 98.1 F (36.7 C)  SpO2: (!) 87% 97%    Intake/Output Summary (Last 24 hours) at 08/13/2019 1422 Last data filed at 08/13/2019 1345 Gross per 24 hour  Intake 1187.17 ml  Output 1040 ml  Net 147.17 ml   Filed Weights   08/12/19 0028 08/12/19 0517 08/13/19 0344  Weight: (!) 192.8 kg (!) 192.8 kg (!) 193.8 kg    ROS: Review of Systems  Respiratory: Positive for shortness of breath.   Cardiovascular: Negative for chest pain.  Gastrointestinal: Positive for abdominal pain.  Musculoskeletal: Positive for joint pain.   Exam: Physical Exam HENT:     Nose: No mucosal edema.     Mouth/Throat:     Pharynx: No oropharyngeal exudate.  Eyes:     General: Lids are normal.     Conjunctiva/sclera: Conjunctivae normal.     Pupils: Pupils are equal, round, and reactive to light.  Cardiovascular:     Rate and Rhythm: Normal rate. Rhythm irregularly irregular.     Heart sounds: S1 normal and S2 normal. No murmur heard.   Pulmonary:     Effort: No respiratory distress.     Breath sounds: Examination of the right-lower field reveals decreased breath sounds. Examination of the left-lower field reveals decreased breath sounds. Decreased breath sounds present. No wheezing, rhonchi or rales.  Abdominal:     Palpations: Abdomen is soft.     Tenderness: There is abdominal tenderness in the left upper quadrant.  Musculoskeletal:     Right ankle: Swelling  present.     Left ankle: Swelling present.  Skin:    General: Skin is warm.     Comments: Lower extremity bilateral swelling and ulcerations.  Erythema much improved since presentation.  Neurological:     Mental Status: He is alert.       Data Reviewed: Basic Metabolic Panel: Recent Labs  Lab 08/09/19 0346 08/09/19 0914 08/10/19 0520 08/11/19 1022 08/12/19 0647 08/13/19 0715  NA 136  --  137 132* 134* 137  K 3.7  --  3.7 3.9 4.3 4.3  CL 91*  --  94* 92* 95* 98  CO2 28  --  31 26 27 31   GLUCOSE 134*  --  115* 170* 172* 111*  BUN 12  --  11 11 14  21*  CREATININE 1.03  --  0.89 0.81 0.92 0.87  CALCIUM 8.0*  --  8.1* 8.8* 9.4 9.4  MG 2.2 2.1 1.7  --   --   --   PHOS 2.0*  --   --   --   --   --    Liver Function Tests: Recent Labs  Lab 08/09/19 0346  AST 77*  ALT 44  ALKPHOS 107  BILITOT 0.8  PROT 8.2*  ALBUMIN 4.0   Recent Labs  Lab 08/09/19 0346  LIPASE 41   Recent Labs  Lab 08/09/19 0346  AMMONIA <9*   CBC: Recent Labs  Lab 08/09/19 0346 08/11/19 1022  WBC 6.9 7.9  NEUTROABS 4.5  --   HGB 12.5* 12.0*  HCT 39.0 36.0*  MCV 89.9 88.0  PLT 238 149*   Cardiac Enzymes: Recent Labs  Lab 08/09/19 0346  CKTOTAL 349   BNP (last 3 results) Recent Labs    05/02/19 1546 06/22/19 0852 08/09/19 0346  BNP 44.1 118.0* 67.1    ProBNP (last 3 results) No results for input(s): PROBNP in the last 8760 hours.  CBG: Recent Labs  Lab 08/12/19 1230 08/12/19 1639 08/12/19 2108 08/13/19 0801 08/13/19 1133  GLUCAP 167* 204* 140* 94 126*    Recent Results (from the past 240 hour(s))  SARS Coronavirus 2 by RT PCR (hospital order, performed in [facility] hospital lab) Nasopharyngeal Nasopharyngeal Swab     Status: None   Collection Time: 08/09/19  3:46 AM   Specimen: Nasopharyngeal Swab  Result Value Ref Range Status   SARS Coronavirus 2 NEGATIVE NEGATIVE Final    Comment: (NOTE) SARS-CoV-2 target nucleic acids are NOT DETECTED.  The SARS-CoV-2  RNA is generally detectable in upper and lower respiratory specimens during the acute phase of infection. The lowest concentration of SARS-CoV-2 viral copies this assay can detect is 250 copies / mL. A negative result does not preclude SARS-CoV-2 infection and should not be used as the sole basis for treatment or other patient management decisions.  A negative result may occur with improper specimen collection / handling, submission of specimen other than nasopharyngeal swab, presence of viral mutation(s) within the areas targeted by this assay, and inadequate number of viral copies (<250 copies / mL). A negative result must be combined with clinical observations, patient history, and epidemiological information.  Fact Sheet for Patients:   BoilerBrush.com.cy  Fact Sheet for Healthcare Providers: https://pope.com/  This test is not yet approved or  cleared by the [place] FDA and has been authorized for detection and/or diagnosis of SARS-CoV-2 by FDA under an Emergency Use Authorization (EUA).  This EUA will remain in effect (meaning this test can be used) for the duration of the COVID-19 declaration under Section 564(b)(1) of the Act, 21 U.S.C. section 360bbb-3(b)(1), unless the authorization is terminated or revoked sooner.  Performed at [facility], [address] [place], [place] [address]   Blood Culture (routine x 2)     Status: None (Preliminary result)   Collection Time: 08/09/19  3:46 AM   Specimen: BLOOD  Result Value Ref Range Status   Specimen Description BLOOD RIGHT [facility]  Final   Special Requests   Final    BOTTLES DRAWN AEROBIC AND ANAEROBIC Blood Culture adequate volume   Culture   Final    NO GROWTH 4 DAYS Performed at [facility], [address], [place], [place] [address]    Report Status PENDING  Incomplete  Blood Culture (routine x 2)     Status: None (Preliminary result)    Collection Time: 08/09/19  3:46 AM   Specimen: BLOOD  Result Value Ref Range Status   Specimen Description BLOOD LEFT HAND  Final   Special Requests   Final    BOTTLES DRAWN AEROBIC AND ANAEROBIC Blood Culture results may not be optimal due to an excessive volume of blood received in culture bottles   Culture   Final    NO GROWTH 4 DAYS Performed at [facility], [address] [place], [place] [address]    Report Status PENDING  Incomplete  Urine Culture  Status: Abnormal   Collection Time: 08/09/19  9:14 AM   Specimen: Urine, Clean Catch  Result Value Ref Range Status   Specimen Description   Final    URINE, CLEAN CATCH Performed at [facility], [address] [place], [place] [address]    Special Requests   Final    Normal Performed at [facility], [address] [place], [place] [address]    Culture (A)  Final    10,000 COLONIES/mL MULTIPLE SPECIES PRESENT, SUGGEST RECOLLECTION   Report Status 08/11/2019 FINAL  Final  MRSA PCR Screening     Status: Abnormal   Collection Time: 08/09/19  5:18 PM   Specimen: Nasopharyngeal  Result Value Ref Range Status   MRSA by PCR POSITIVE (A) NEGATIVE Final    Comment:        The GeneXpert MRSA Assay (FDA approved for NASAL specimens only), is one component of a comprehensive MRSA colonization surveillance program. It is not intended to diagnose MRSA infection nor to guide or monitor treatment for MRSA infections. RESULT CALLED TO, READ BACK BY AND VERIFIED WITH: Richardean Sale 08/09/19 @1932  BY ACR Performed at [facility], [address] [place] [place], [place]      Studies: CT CERVICAL SPINE WO CONTRAST  Result Date: 08/11/2019 CLINICAL DATA:  Acute generalized neck pain. EXAM: CT CERVICAL SPINE WITHOUT CONTRAST TECHNIQUE: Multidetector CT imaging of the cervical spine was performed without intravenous contrast. Multiplanar CT image reconstructions were also generated.  COMPARISON:  Cervical MRI of the same date. FINDINGS: Compared with the previous MRI, the image quality of this examination is better, although the reformatted images through the lower cervical spine remain limited by body habitus. Alignment: Straightening of the usual cervical lordosis with 3 mm of degenerative anterolisthesis at C4-5 and a slight retrolisthesis at C6-7. Skull base and vertebrae: No evidence of acute fracture or traumatic subluxation. Marked disc space narrowing and endplate sclerosis at C6-7 with possible interbody ankylosis. In correlation with the MRI which showed no discal hyperintensity, no evidence of discitis or osteomyelitis. Soft tissues and spinal canal: No prevertebral fluid or swelling. No visible canal hematoma. Disc levels:  No significant findings at C1-2 or C2-3. C3-4: Loss of disc height with uncinate spurring asymmetric to the left and mild bilateral facet hypertrophy. Moderate left greater than right osseous foraminal narrowing. C4-5: Bilateral facet hypertrophy accounts for the anterolisthesis. There is mild uncinate spurring with mild right-greater-than-left foraminal narrowing. C5-6: Minimal uncinate spurring. No significant foraminal narrowing. C6-7: Chronic loss of disc height with endplate sclerosis and mild to moderate foraminal narrowing bilaterally. C7-T1: Bilateral facet hypertrophy contributes to mild foraminal narrowing bilaterally. Upper chest: No significant findings. Other: Bilateral carotid atherosclerosis. IMPRESSION: 1. No evidence of acute cervical spine fracture, traumatic subluxation or static signs of instability. 2. Multilevel cervical spondylosis as described. Moderate left greater than right osseous foraminal narrowing at C3-4. 3. Advanced disc space narrowing and possible ankylosis at C6-7. Electronically Signed   By: 08/13/2019 M.D.   On: 08/11/2019 16:03    Scheduled Meds: . aspirin EC  81 mg Oral Daily  . atorvastatin  40 mg Oral Daily  .  Chlorhexidine Gluconate Cloth  6 each Topical Q0600  . folic acid  1 mg Oral Daily  . insulin aspart  0-20 Units Subcutaneous TID WC  . insulin aspart  0-5 Units Subcutaneous QHS  . metoprolol succinate  50 mg Oral Daily  . multivitamin with minerals  1 tablet Oral Daily  .  mupirocin ointment  1 application Nasal BID  . predniSONE  30 mg Oral Q breakfast  . sulfamethoxazole-trimethoprim  1 tablet Oral Q12H  . thiamine  100 mg Oral Daily   Or  . thiamine  100 mg Intravenous Daily  . torsemide  40 mg Oral BID  . traZODone  150 mg Oral QHS  . warfarin  3 mg Oral ONCE-1600  . Warfarin - Pharmacist Dosing Inpatient   Does not apply q1600   Continuous Infusions: . sodium chloride 500 mL (08/13/19 0559)    Assessment/Plan:  1. Acute now chronic hypoxic respiratory failure.  Patient dropped his oxygen saturation on room air and with ambulation dropped even further.  Nurse to qualify for home oxygen. 2. Clinical sepsis, present on admission with bilateral lower extremity ulcerations and surrounding erythema and cellulitis.  Patient on Maxipime and vancomycin from admission.  Since cellulitis looking better will switch over to Bactrim DS.  Wound care nurse to place Unna boots on again today.  Patient to follow-up with home health. 3. Acute alcohol withdrawal.  This has improved during the hospital course.  Slight tremor. 4. Neck pain and shoulder pain.  MRI and CT scan of the neck reviewed.  Patient placed on steroids to break down inflammation.  Will need continued physical therapy and Occupational Therapy. 5. Acute on chronic diastolic congestive heart failure.  Patient on metoprolol.  Looks like he was on Demadex at home we will switch over to Demadex 40 mg twice daily. 6. Chronic atrial fibrillation on Coumadin.  INR therapeutic.  Metoprolol for rate control 7. Obstructive sleep apnea on BiPAP at night with naps. 8. Morbid obesity.  BMI 64.97  Code Status:     Code Status Orders  (From  admission, onward)         Start     Ordered   08/09/19 0615  Full code  Continuous        08/09/19 0621        Code Status History    Date Active Date Inactive Code Status Order ID Comments User Context   06/22/2019 1531 06/26/2019 1742 Full Code [id number]  Ivor Costa, MD Inpatient   03/04/2019 2314 03/05/2019 2105 Full Code [id number]  Orene Desanctis, DO ED   10/24/2018 0420 10/28/2018 1849 Full Code [id number]  Lance Coon, MD Inpatient   08/06/2018 1815 08/10/2018 2042 Full Code [id number]  Loletha Grayer, MD ED   07/18/2018 0716 07/23/2018 1827 Full Code [id number]  Harrie Foreman, MD ED   05/29/2018 1453 06/01/2018 1931 Full Code [id number]  Salary, Avel Peace, MD Inpatient   03/17/2018 0042 03/25/2018 1554 Full Code [id number]  Vaughan Basta, MD ED   02/24/2018 0725 02/27/2018 1555 Full Code [id number]  Lance Coon, MD Inpatient   01/27/2018 1345 01/29/2018 1406 Full Code [id number]  Loletha Grayer, MD ED   01/17/2018 1330 01/19/2018 1818 Partial Code [id number]  Hillary Bow, MD Inpatient   01/16/2018 1427 01/17/2018 1330 Full Code [id number]  Dustin Flock, MD ED   Advance Care Planning Activity     Disposition Plan: Status is: Inpatient  Dispo: The patient is from: Home              Anticipated d/c is to: Home              Anticipated d/c date is: Potential 08/13/2019              Patient currently being qualified for oxygen at home.  Patient does wear BiPAP  at night and at rest.  Patient cellulitis has improved and switching over to oral antibiotics.  With blood pressure being low yesterday I did decrease his medications.  Now since blood pressure is better today I will increase his Demadex to twice daily dosing.  Maybe this will help out with his respiratory status.  Consultants:  Cardiology  Antibiotics: -Change antibiotics over to Bactrim DS twice a day  Time spent: 27 minutes  [employer]

## 2019-08-13 NOTE — Evaluation (Signed)
Occupational Therapy Evaluation Patient Details Name: [name] [name] MRN: [id number] DOB: 1960-08-17 Today's Date: 08/13/2019    History of Present Illness 59 y/o M here with b/l LE ulcerations/sepsis. PMH: CHF with EF of 25-30%, left bundle blockade, hypertension, hyperlipidemia, COPD, asthma, hypothyroidism, gout, depression, atrial fibrillation, PE/DVT on Coumadin, OSA on CPAP, CAD, iron deficiency anemia, who presents with shortness breath and some nausea. Chest x-ray with cardiomegaly, vascular congestion and interstitial pulmonary edema.    Clinical Impression   Pt was seen for OT evaluation this date. Prior to hospital admission, pt was MOD I for ADLs/ADL mobility, but endorses struggling greatly, especially with LB ADLs. Pt states he was having to use electric scooter more and more frequently for fxl mobility d/t R hip pain. Pt lives alone in [facility]. Currently pt demonstrates impairments as described below (See OT problem list) which functionally limit his ability to perform ADL/self-care tasks. Pt currently requires CGA for ADL transfers with Benjie Karvonen and MOD/MAX A with LB ADLs.  Pt would benefit from skilled OT to address noted impairments and functional limitations (see below for any additional details) in order to maximize safety and independence while minimizing falls risk and caregiver burden. Upon hospital discharge, recommend STR to maximize pt safety and return to PLOF.     Follow Up Recommendations  SNF    Equipment Recommendations  3 in 1 bedside commode    Recommendations for Other Services       Precautions / Restrictions Precautions Precautions: Fall Restrictions Weight Bearing Restrictions: No      Mobility Bed Mobility               General bed mobility comments: pt up to chair pre/post session  Transfers Overall transfer level: Needs assistance Equipment used: Rolling walker (2 wheeled) (bariatric) Transfers: Sit to/from Stand Sit to Stand: Min  guard;Supervision         General transfer comment: somewhat impuslive requring increased cues for safety    Balance Overall balance assessment: Needs assistance Sitting-balance support: Bilateral upper extremity supported Sitting balance-Leahy Scale: Good     Standing balance support: Bilateral upper extremity supported Standing balance-Leahy Scale: Fair Standing balance comment: poor standing tolerance, c/o dizziness, BP taken with no orthostatics (SBP 127 sitting, 155 standing)                           ADL either performed or assessed with clinical judgement   ADL Overall ADL's : Needs assistance/impaired Eating/Feeding: Independent   Grooming: Wash/dry hands;Wash/dry face;Oral care;Independent;Sitting           Upper Body Dressing : Set up;Sitting   Lower Body Dressing: Minimal assistance;Moderate assistance;Sit to/from stand Lower Body Dressing Details (indicate cue type and reason): Pt reports generally wearing slip-on shoes at home d/t difficulty reaching feet for dressing. Toilet Transfer: Minimal assistance;Stand-pivot;BSC   Toileting- Clothing Manipulation and Hygiene: Moderate assistance;Maximal assistance;Sit to/from stand Toileting - Clothing Manipulation Details (indicate cue type and reason): pt reports using a bidet at home.             Vision Patient Visual Report: No change from baseline       Perception     Praxis      Pertinent Vitals/Pain Pain Assessment: Faces Faces Pain Scale: Hurts little more Pain Location: reprots R hip pain and and general R LE pain Pain Descriptors / Indicators: Aching Pain Intervention(s): Limited activity within patient's tolerance;Monitored during session     Hand  Dominance     Extremity/Trunk Assessment Upper Extremity Assessment Upper Extremity Assessment: Generalized weakness   Lower Extremity Assessment Lower Extremity Assessment: Generalized weakness       Communication  Communication Communication: No difficulties   Cognition Arousal/Alertness: Awake/alert Behavior During Therapy: WFL for tasks assessed/performed Overall Cognitive Status: Within Functional Limits for tasks assessed                                 General Comments: some inappropriate comments re: this author's appearance   General Comments       Exercises Other Exercises Other Exercises: OT facilitates education re: role of OT in acute setting, pt somewhat familiar from previous admit. Orthostatics taken as pt with c/o dizziness on transition from sit to stand. pt 122/84 sitting, 155/122 in standing (unsure of efficacy of DBP).   Shoulder Instructions      Home Living Family/patient expects to be discharged to:: Skilled nursing facility Living Arrangements: Alone Available Help at Discharge: Other (Comment) (h/o in home PCAs previously, but apparently, not currently)                         Home Equipment: Walker - 4 wheels;Electric scooter;Shower seat;Hand held shower head (bidet for peri care as pt unable to reach to complete thoroughly after BM.)   Additional Comments: uses 4WW primarily for short distances, endorses scooter use for longer distances.      Prior Functioning/Environment Level of Independence: Independent with assistive device(s)        Comments: Pt out of the home weekly for MD appointments, does grocery shopping 1x/month and uses motorized cart        OT Problem List: Decreased strength;Decreased range of motion;Decreased activity tolerance;Impaired balance (sitting and/or standing);Decreased knowledge of use of DME or AE;Pain;Obesity      OT Treatment/Interventions: Self-care/ADL training;Therapeutic exercise;DME and/or AE instruction;Therapeutic activities;Balance training;Patient/family education    OT Goals(Current goals can be found in the care plan section) Acute Rehab OT Goals Patient Stated Goal: to get my hip hurting  less or get to where I can qualify for hip replacement OT Goal Formulation: With patient Time For Goal Achievement: 08/27/19 Potential to Achieve Goals: Fair  OT Frequency: Min 1X/week   Barriers to D/C:            Co-evaluation              AM-PAC OT "6 Clicks" Daily Activity     Outcome Measure Help from another person eating meals?: None Help from another person taking care of personal grooming?: A Little Help from another person toileting, which includes using toliet, bedpan, or urinal?: A Lot Help from another person bathing (including washing, rinsing, drying)?: A Lot Help from another person to put on and taking off regular upper body clothing?: A Little Help from another person to put on and taking off regular lower body clothing?: A Lot 6 Click Score: 16   End of Session Equipment Utilized During Treatment: Gait belt;Rolling walker Nurse Communication: Mobility status  Activity Tolerance: Patient tolerated treatment well Patient left: in chair;with call bell/phone within reach;Other (comment) (with dietary presenting with lunch tray)  OT Visit Diagnosis: Unsteadiness on feet (R26.81);Other abnormalities of gait and mobility (R26.89);Muscle weakness (generalized) (M62.81)                Time: 5681-2751 OT Time Calculation (min): 32 min Charges:  OT  General Charges $OT Visit: 1 Visit OT Evaluation $OT Eval Moderate Complexity: 1 Mod OT Treatments $Therapeutic Activity: 8-22 mins  Gerrianne Scale, MS, OTR/L ascom [phone] 08/13/19, 5:30 PM

## 2019-08-13 NOTE — TOC Initial Note (Signed)
Transition of Care [facility]) - Initial/Assessment Note    Patient Details  Name: [name] [name] MRN: [id number] Date of Birth: Mar 11, 1960  Transition of [facility]) CM/SW Contact:    Shawn Route, RN Phone Number: 08/13/2019, 10:43 AM                  Expected Discharge Plan: Home w Home Health Services Barriers to Discharge: Continued Medical Work up   Patient Goals and CMS Choice        Expected Discharge Plan and Services Expected Discharge Plan: Home w Home Health Services In-house Referral: Clinical Social Work Discharge Planning Services: CM Consult   Living arrangements for the past 2 months: Single Family Home                           HH Arranged: PT, RN, Nurse's Aide HH Agency: [facility] Date [facility] Agency Contacted: 08/13/19 Time HH Agency Contacted: [phone] Representative spoke with at [facility] Agency: [place]  Prior Living Arrangements/Services Living arrangements for the past 2 months: Single Family Home Lives with:: Self              Current home services: Home RN, Homehealth aide Criminal Activity/Legal Involvement Pertinent to Current Situation/Hospitalization: No - Comment as needed  Activities of Daily Living Home Assistive Devices/Equipment: CPAP, Wheelchair, Environmental consultant (specify type) ADL Screening (condition at time of admission) Patient's cognitive ability adequate to safely complete daily activities?: Yes Is the patient deaf or have difficulty hearing?: No Does the patient have difficulty seeing, even when wearing glasses/contacts?: No Does the patient have difficulty concentrating, remembering, or making decisions?: No Patient able to express need for assistance with ADLs?: Yes Does the patient have difficulty dressing or bathing?: Yes Independently performs ADLs?: No Communication: Independent Dressing (OT): Independent Grooming: Needs assistance Is this a change from baseline?: Pre-admission baseline Feeding: Independent Bathing:  Needs assistance Is this a change from baseline?: Pre-admission baseline Toileting: Independent In/Out Bed: Independent Walks in Home: Independent with device (comment) Does the patient have difficulty walking or climbing stairs?: Yes Weakness of Legs: Both Weakness of Arms/Hands: None  Permission Sought/Granted                  Emotional Assessment              Admission diagnosis:  Morbid obesity (HCC) [E66.01] Alcohol abuse [F10.10] Chronic pain syndrome [G89.4] Elevated troponin level [R77.8] Wound infection [T14.8XXA, L08.9] NSTEMI (non-ST elevated myocardial infarction) (HCC) [I21.4] Alcoholic intoxication with complication (HCC) [F10.929] Chest pain, unspecified type [R07.9] Sepsis, due to unspecified organism, unspecified whether acute organ dysfunction present [facility]) [A41.9] Patient Active Problem List   Diagnosis Date Noted  . Alcohol withdrawal syndrome without complication (HCC)   . Lower extremity ulceration, unspecified laterality, with fat layer exposed (HCC) 08/09/2019  . Alcoholic intoxication without complication (HCC) 08/09/2019  . Acute on chronic diastolic CHF (congestive heart failure) (HCC)   . Alcohol abuse   . Atrial fibrillation, chronic (HCC)   . Pressure injury of skin 06/26/2019  . Cellulitis due to methicillin-resistant Staphylococcus aureus (MRSA) 06/23/2019  . Iron deficiency anemia 06/22/2019  . Depression 06/22/2019  . Troponin level elevated 06/22/2019  . Left-sided Bell's palsy 05/08/2019  . Slurred speech 05/03/2019  . Chronic intractable headache 05/03/2019  . Head injuries, initial encounter 05/03/2019  . Hand pain, left 05/03/2019  . Noncompliance by refusing service 05/03/2019  . Facial droop 05/02/2019  . Pharmacologic therapy 04/11/2019  .  Disorder of skeletal system 04/11/2019  . Problems influencing health status 04/11/2019  . Chronic anticoagulation (warfarin  COUMADIN) 04/11/2019  . Hypocalcemia 04/11/2019  .  Elevated sed rate 04/11/2019  . Elevated C-reactive protein (CRP) 04/11/2019  . Elevated hemoglobin A1c 04/11/2019  . Hypoalbuminemia 04/11/2019  . Edema due to hypoalbuminemia 04/11/2019  . Elevated brain natriuretic peptide (BNP) level 04/11/2019  . Chronic hip pain (Right) 04/11/2019  . Osteoarthritis of hip (Right) 04/11/2019  . Atrial fibrillation with RVR ([place]) 03/08/2019  . Degenerative joint disease of right hip 03/05/2019  . Hypothyroidism 03/04/2019  . Chronic venous stasis dermatitis of both lower extremities 03/04/2019  . Long term (current) use of anticoagulants 02/23/2019  . Cellulitis 01/22/2019  . PE (pulmonary thromboembolism) ([place]) 01/21/2019  . Open leg wound 01/21/2019  . HLD (hyperlipidemia) 01/21/2019  . CAD (coronary artery disease) 01/21/2019  . Cellulitis of both lower extremities 01/21/2019  . Noncompliance 01/09/2019  . Acquired thrombophilia ([place]) 11/28/2018  . Subtherapeutic international normalized ratio (INR) 11/11/2018  . Acute pulmonary embolism ([place]) 11/01/2018  . Depression, major, single episode, moderate ([place]) 09/17/2018  . Chest pain 08/06/2018  . Personal history of DVT (deep vein thrombosis) 07/13/2018  . History of pulmonary embolism (on Coumadin) 07/13/2018  . Neck pain 07/13/2018  . Chronic low back pain (Bilateral)  w/ sciatica (Bilateral) 07/13/2018  . TBI (traumatic brain injury) ([place]) 07/04/2018  . Anemia 06/27/2018  . Abnormal thyroid blood test 06/27/2018  . Diet-controlled diabetes mellitus ([place]) 06/06/2018  . HTN (hypertension) 06/06/2018  . Chronic gout involving toe, unspecified cause, unspecified laterality 06/06/2018  . Morbid obesity with BMI of 60.0-69.9, adult ([place]) 06/06/2018  . Chronic pain syndrome 06/06/2018  . Ulcers of both lower extremities, limited to breakdown of skin ([place]) 06/06/2018  . Gout 06/06/2018  . Persistent atrial fibrillation ([place])   . Sepsis ([place]) 03/17/2018  . Chronic combined systolic and  diastolic heart failure ([place]) 02/02/2018  . COPD (chronic obstructive pulmonary disease) ([place]) 02/02/2018  . Obstructive sleep apnea 02/02/2018  . Lymphedema 02/02/2018  . Acute on chronic systolic CHF (congestive heart failure) ([place]) 02/02/2018  . Acute respiratory failure with hypoxia (HCC) 01/27/2018   PCP:  Doreen Beam, FNP Pharmacy:   [place], [place] [address] Phone: [phone] Fax: [phone]     Social Determinants of Health (SDOH) Interventions    Readmission Risk Interventions Readmission Risk Prevention Plan 01/25/2019 10/25/2018 08/08/2018  Transportation Screening Complete Complete Complete  PCP or Specialist Appt within 3-5 Days - Complete Complete  HRI or [place] - Complete Complete  Social Work Consult for [place] Planning/Counseling - Complete Patient refused  Palliative Care Screening - Not Applicable Not Applicable  Medication Review Press photographer) Complete Complete Complete  PCP or Specialist appointment within 3-5 days of discharge Complete - -  [place] or Home Care Consult Complete - -  SW Recovery Care/Counseling Consult Complete - -  Palliative Care Screening Not Applicable - -  [place] Not Applicable - -

## 2019-08-13 NOTE — Consult Note (Addendum)
ANTICOAGULATION CONSULT NOTE  Pharmacy Consult for Warfarin Dosing  Indication: atrial fibrillation  No Known Allergies  Patient Measurements: Height: 5\' 8"  (172.7 cm) Weight: (!) 193.8 kg (427 lb 4.8 oz) IBW/kg (Calculated) : 68.4  Vital Signs: Temp: 98.4 F (36.9 C) (06/28 0759) BP: 120/71 (06/28 0759) Pulse Rate: 55 (06/28 0759)  Labs: Recent Labs    08/11/19 1022 08/12/19 0647 08/12/19 1104 08/13/19 0715  HGB 12.0*  --   --   --   HCT 36.0*  --   --   --   PLT 149*  --   --   --   LABPROT 28.6*  --  27.4* 25.2*  INR 2.8*  --  2.7* 2.4*  CREATININE 0.81 0.92  --  0.87    Estimated Creatinine Clearance: 155.3 mL/min (by C-G formula based on SCr of 0.87 mg/dL).   Medical History: Past Medical History:  Diagnosis Date   Anxiety    Arthritis    Asthma    Brain damage    Chronic pain of both knees 07/13/2018   Clotting disorder (HCC)    COPD (chronic obstructive pulmonary disease) (HCC)    Depression    HFrEF (heart failure with reduced ejection fraction) (HCC)    a. 03/2018 Echo: EF 25-30%, diff HK. Mod LAE.   History of DVT (deep vein thrombosis)    History of pulmonary embolism    a. Chronic coumadin.   Hypertension    MI (myocardial infarction) (HCC)    Morbid obesity (HCC)    Neck pain 07/13/2018   NICM (nonischemic cardiomyopathy) (HCC)    a. s/p Cath x 3 - reportedly nl cors. Last cath 2019 in [place]; b. a. 03/2018 Echo: EF 25-30%, diff HK.   Persistent atrial fibrillation (HCC)    a. 03/2018 s/p DCCV; b. CHA2DS2VASc = 1-->Xarelto (later changed to warfarin); c. 05/2018 recurrent afib-->Amio initiated.   Sleep apnea     Assessment: Pharmacy consulted for warfarin dosing and monitoring for 59 yo male with PMH of A. Fib. INR therapeutic on admission. DDI: cefepime, asa, APAP.   Home Regimen: Warfarin 5mg    DATE INR DOSE 6/24 2.0 5 mg 6/25 2.8 2.5 mg  6/26 2.8 3 mg 6/27 2.7 3 mg 6/28     2.4  Goal of Therapy:  INR: 2-3   Plan:   INR therapeutic, but with slight downward trend.   Will give Warfarin 3 mg tonight (still avoiding full home dose per interaction with newly started Bactrim DS).   Daily INR ordered. CBC at least every 3 days.  Recommend f/u INR in 3 days if pt discharges.   7/27, PharmD, BCPS Clinical Pharmacist 08/13/2019 9:49 AM

## 2019-08-13 NOTE — Progress Notes (Signed)
[facility] Cardiology    SUBJECTIVE: [name] [name] is a 59 year old male with a past medical history significant for chronic atrial fibrillation, anticoagulated with warfarin, coronary artery disease, HFpEF, history of a DVT/PEwhile on Xarelto, history of a TBI, type 2 diabetes, COPD, obstructive sleep apnea, on BiPAP, and morbid obesitywho presented to the ED on 08/09/19 for an acute onset ofchest pain. Workup in the EDwassignificant for lactic acid of 3.6, ethanol level of 350, high sensitivity troponin of 49, 56 respectively, ECG revealing atrial fibrillation with rapid ventricular response with a intraventricular conduction delay, and chest xray revealing vascular congestion and cardiomegaly with minimal interstitial edema.  Chest CT was negative for a PE.   [name] [name] continues to experience mild, left sided chest discomfort, but this has significantly improved since admission.  His biggest concern is a headache, dizziness, and bilateral lower extremity pain.   He is followed in outpatient cardiology by Dr. Juliann Pares.  Most recent echocardiogram through [facility] on 06/23/19 revealed normal RV and LV systolic function with an EF estimated between 55-60% with no evidence of regional wall abnormalities. Grade 1 diastolic dysfunction noted. No evidence of significant valvular abnormalities.    Vitals:   08/12/19 2020 08/12/19 2133 08/13/19 0343 08/13/19 0344  BP: (!) 87/68 111/73 (!) 134/98 (!) 134/98  Pulse: (!) 53 83 86 100  Resp:      Temp:   98.3 F (36.8 C) 98.3 F (36.8 C)  TempSrc:      SpO2: 90% 100% 100% 100%  Weight:    (!) 193.8 kg  Height:         Intake/Output Summary (Last 24 hours) at 08/13/2019 0743 Last data filed at 08/13/2019 0349 Gross per 24 hour  Intake 707.17 ml  Output 1690 ml  Net -982.83 ml      PHYSICAL EXAM  General: Well developed, morbidly obese, in no acute distress HEENT:  Normocephalic and atramatic Neck:  No JVD.  Lungs: Clear bilaterally to  auscultation and percussion. Heart: Irregularly irregular, controlled ventricular response . Normal S1 and S2 without gallops or murmurs.  Abdomen: Bowel sounds are positive, abdomen soft and non-tender  Msk:  Back normal. Normal strength and tone for age. Extremities: No clubbing or cyanosis. Wounds/ulcers wrapped with 1+ pitting edema in bilateral lower extremities  Neuro: Alert and oriented X 3. Psych:  Good affect, responds appropriately   LABS: Basic Metabolic Panel: Recent Labs    08/11/19 1022 08/12/19 0647  NA 132* 134*  K 3.9 4.3  CL 92* 95*  CO2 26 27  GLUCOSE 170* 172*  BUN 11 14  CREATININE 0.81 0.92  CALCIUM 8.8* 9.4   Liver Function Tests: No results for input(s): AST, ALT, ALKPHOS, BILITOT, PROT, ALBUMIN in the last 72 hours. No results for input(s): LIPASE, AMYLASE in the last 72 hours. CBC: Recent Labs    08/11/19 1022  WBC 7.9  HGB 12.0*  HCT 36.0*  MCV 88.0  PLT 149*   Cardiac Enzymes: No results for input(s): CKTOTAL, CKMB, CKMBINDEX, TROPONINI in the last 72 hours. BNP: Invalid input(s): POCBNP D-Dimer: No results for input(s): DDIMER in the last 72 hours. Hemoglobin A1C: No results for input(s): HGBA1C in the last 72 hours. Fasting Lipid Panel: No results for input(s): CHOL, HDL, LDLCALC, TRIG, CHOLHDL, LDLDIRECT in the last 72 hours. Thyroid Function Tests: No results for input(s): TSH, T4TOTAL, T3FREE, THYROIDAB in the last 72 hours.  Invalid input(s): FREET3 Anemia Panel: No results for input(s): VITAMINB12, FOLATE, FERRITIN,  TIBC, IRON, RETICCTPCT in the last 72 hours.  CT CERVICAL SPINE WO CONTRAST  Result Date: 08/11/2019 CLINICAL DATA:  Acute generalized neck pain. EXAM: CT CERVICAL SPINE WITHOUT CONTRAST TECHNIQUE: Multidetector CT imaging of the cervical spine was performed without intravenous contrast. Multiplanar CT image reconstructions were also generated. COMPARISON:  Cervical MRI of the same date. FINDINGS: Compared with the  previous MRI, the image quality of this examination is better, although the reformatted images through the lower cervical spine remain limited by body habitus. Alignment: Straightening of the usual cervical lordosis with 3 mm of degenerative anterolisthesis at C4-5 and a slight retrolisthesis at C6-7. Skull base and vertebrae: No evidence of acute fracture or traumatic subluxation. Marked disc space narrowing and endplate sclerosis at C6-7 with possible interbody ankylosis. In correlation with the MRI which showed no discal hyperintensity, no evidence of discitis or osteomyelitis. Soft tissues and spinal canal: No prevertebral fluid or swelling. No visible canal hematoma. Disc levels:  No significant findings at C1-2 or C2-3. C3-4: Loss of disc height with uncinate spurring asymmetric to the left and mild bilateral facet hypertrophy. Moderate left greater than right osseous foraminal narrowing. C4-5: Bilateral facet hypertrophy accounts for the anterolisthesis. There is mild uncinate spurring with mild right-greater-than-left foraminal narrowing. C5-6: Minimal uncinate spurring. No significant foraminal narrowing. C6-7: Chronic loss of disc height with endplate sclerosis and mild to moderate foraminal narrowing bilaterally. C7-T1: Bilateral facet hypertrophy contributes to mild foraminal narrowing bilaterally. Upper chest: No significant findings. Other: Bilateral carotid atherosclerosis. IMPRESSION: 1. No evidence of acute cervical spine fracture, traumatic subluxation or static signs of instability. 2. Multilevel cervical spondylosis as described. Moderate left greater than right osseous foraminal narrowing at C3-4. 3. Advanced disc space narrowing and possible ankylosis at C6-7. Electronically Signed   By: Carey Bullocks M.D.   On: 08/11/2019 16:03   MR CERVICAL SPINE WO CONTRAST  Result Date: 08/11/2019 CLINICAL DATA:  Neck pain with inability to elevate shoulders. Concern for cervical radiculopathy.  Bilateral leg pain and ulcerations. EXAM: MRI CERVICAL SPINE WITHOUT CONTRAST TECHNIQUE: Multiplanar, multisequence MR imaging of the cervical spine was performed. No intravenous contrast was administered. COMPARISON:  None. FINDINGS: Despite efforts by the technologist and patient, moderate motion artifact is present on today's exam and could not be eliminated. This reduces exam sensitivity and specificity. Image quality is also degraded by body habitus (patient weight 430 lb). Alignment: Straightening of the usual cervical lordosis. There is a minimal retrolisthesis at C6-7. No focal angulation. Vertebrae: There is increased inversion recovery and decreased T1 signal within the endplates at C6-7. There is intervening advanced disc space narrowing, and no hyperintensity within the intervening disc or endplate destruction. These findings are probably degenerative. No evidence of acute fracture or bone destruction. Cord: Appears normal in signal and caliber. Posterior Fossa, vertebral arteries, paraspinal tissues: Visualized portions of the posterior fossa and paraspinal soft tissues appear unremarkable. Bilateral vertebral artery flow voids. Disc levels: Evaluation of the individual disc space levels is limited by motion artifact. The spinal canal appears widely patent at each level, and there is no cord deformity. At C3-4, there is moderate disc space narrowing with uncinate spurring contributing to mild-to-moderate foraminal narrowing bilaterally. No cord deformity. At C4-5, there is asymmetric uncinate spurring and facet hypertrophy on the right contributing to moderate right foraminal narrowing. Disc height is relatively maintained at C5-6. Mild facet hypertrophy and mild right-sided foraminal narrowing. C6-7: Spondylosis with posterior osteophytes covering diffusely bulging disc material. Mild to moderate foraminal  narrowing bilaterally. IMPRESSION: 1. Examination is moderately degraded by motion and body  habitus. Recommend plain film correlation. Consider further evaluation with CT. 2. Multilevel spondylosis as described. There is no significant spinal stenosis or cord deformity. There is mild-to-moderate foraminal narrowing bilaterally at C3-4, on the right at C4-5 and bilaterally at C6-7. 3. Endplate edema at J1-9 is probably degenerative. No evidence of acute fracture or bone destruction. Electronically Signed   By: Richardean Sale M.D.   On: 08/11/2019 13:58     Echo on 06/23/19: Normal RV and LV systolic function with an EF estimated between 55-60% with no evidence of regional wall abnormalities. Grade 1 diastolic dysfunction noted. No evidence of significant valvular abnormalities.   TELEMETRY: Atrial fibrillation with controlled ventricular response, rate in the 80s. Intraventricular conduction delay    ASSESSMENT AND PLAN:  Principal Problem:   Sepsis ([place]) Active Problems:   Chronic combined systolic and diastolic heart failure (HCC)   COPD (chronic obstructive pulmonary disease) (HCC)   Obstructive sleep apnea   Lymphedema   Diet-controlled diabetes mellitus (HCC)   HTN (hypertension)   Morbid obesity with BMI of 60.0-69.9, adult (HCC)   Chronic pain syndrome   Neck pain   Cellulitis of both lower extremities   Chronic anticoagulation (warfarin  COUMADIN)   Troponin level elevated   Lower extremity ulceration, unspecified laterality, with fat layer exposed ([place])   Alcoholic intoxication without complication (HCC)   Atrial fibrillation, chronic (HCC)   Alcohol withdrawal syndrome without complication ([place])    1.  Chest pain   -Troponin mildly elevated x 2, 49 and 56 respectively; likely demand ischemia in the setting of atrial fibrillation with RVR, sepsis   -No plan for further invasive workup   -Recommend outpatient cardiology follow up with Dr. Clayborn Bigness or [place]stone Pih NP within 7-10 days of discharge   2.  Atrial fibrillation   -Rate is better controlled, currently  in the 80s  -Continue metoprolol 50mg  daily for rate control   -Continue warfarin; most recent INR therapeutic at 2.7   -Alcohol cessation strongly encouraged   3.  Lower extremity swelling/ulcers   -Continue Lasix 40mg  daily and routine wound care   Signing off for now.  Call with further questions or concerns.    The history, physical exam findings, and plan of care were all discussed with Dr. Bartholome Bill, and all decision making was made in collaboration.   Avie Arenas PA-C 08/13/2019 7:43 AM

## 2019-08-13 NOTE — Progress Notes (Signed)
Physical Therapy Treatment Patient Details Name: [name] [name] MRN: [id number] DOB: 25-Mar-1960 Today's Date: 08/13/2019    History of Present Illness 59 y/o M here with b/l LE ulcerations/sepsis. PMH: CHF with EF of 25-30%, left bundle blockade, hypertension, hyperlipidemia, COPD, asthma, hypothyroidism, gout, depression, atrial fibrillation, PE/DVT on Coumadin, OSA on CPAP, CAD, iron deficiency anemia, who presents with shortness breath and some nausea. Chest x-ray with cardiomegaly, vascular congestion and interstitial pulmonary edema.     PT Comments    Pt in recliner in a dark room upon entry, inititally agreeable to session, but progressively more put-off due to pain issues. Pt participates in a few exercises but then declines further activity due to pain. Pt asks for help back to bed, author assisting but pt reluctant to follow safety cues for set up, impulsively gets up and transfers over without warning, even after Pryor Curia warns that recliner is not locked. Pt also not wearing appropriate footwear but transferring in coban wrapped feet. Pt requests author assist with CPAP setup at end of session, RN made aware to assure CPAP is set up correctly.      Follow Up Recommendations  Home health PT     Equipment Recommendations  None recommended by PT    Recommendations for Other Services       Precautions / Restrictions Precautions Precautions: Fall Restrictions Weight Bearing Restrictions: No    Mobility  Bed Mobility Overal bed mobility: Modified Independent                Transfers Overall transfer level: Needs assistance Equipment used: None Transfers: Sit to/from Stand;Stand Pivot Transfers           General transfer comment: impulsive, not following safety cues from author  Ambulation/Gait Ambulation/Gait assistance:  (pt refusing, tired and in pain)               Stairs             Wheelchair Mobility    Modified Rankin (Stroke  Patients Only)       Balance                                            Cognition Arousal/Alertness: Awake/alert Behavior During Therapy: WFL for tasks assessed/performed Overall Cognitive Status: Within Functional Limits for tasks assessed                                        Exercises Other Exercises Other Exercises: LLE LAQ x15, refuses on RLE due to severe pain Other Exercises: LLE marching in place x10 with modA from author, refuses on Rt Other Exercises: Rt CKC ankle PF x5 too painful.    General Comments        Pertinent Vitals/Pain Pain Assessment:  (Severe RLE pain from lumbar spine)    Home Living                      Prior Function            PT Goals (current goals can now be found in the care plan section) Acute Rehab PT Goals Patient Stated Goal: pt thinking about transitioning to ALF setting PT Goal Formulation: With patient Time For Goal Achievement: 08/24/19 Potential to Achieve Goals: Fair Progress towards PT  goals: Not progressing toward goals - comment    Frequency    Min 2X/week      PT Plan Current plan remains appropriate    Co-evaluation              AM-PAC PT "6 Clicks" Mobility   Outcome Measure  Help needed turning from your back to your side while in a flat bed without using bedrails?: None Help needed moving from lying on your back to sitting on the side of a flat bed without using bedrails?: None Help needed moving to and from a bed to a chair (including a wheelchair)?: None Help needed standing up from a chair using your arms (e.g., wheelchair or bedside chair)?: None Help needed to walk in hospital room?: A Lot Help needed climbing 3-5 steps with a railing? : A Lot 6 Click Score: 20    End of Session   Activity Tolerance: Patient limited by pain;Patient limited by fatigue Patient left: with chair alarm set;with call bell/phone within reach Nurse Communication:  Mobility status PT Visit Diagnosis: Muscle weakness (generalized) (M62.81);Difficulty in walking, not elsewhere classified (R26.2);Unsteadiness on feet (R26.81)     Time: 2703-5009 PT Time Calculation (min) (ACUTE ONLY): 15 min  Charges:  $Therapeutic Exercise: 8-22 mins                     12:40 PM, 08/13/19 Rosamaria Lints, PT, DPT Physical Therapist - [facility]  [phone] (ASCOM)    Alianys Chacko C 08/13/2019, 12:37 PM

## 2019-08-14 ENCOUNTER — Telehealth: Payer: Self-pay

## 2019-08-14 LAB — GLUCOSE, CAPILLARY
Glucose-Capillary: 177 mg/dL — ABNORMAL HIGH (ref 70–99)
Glucose-Capillary: 102 mg/dL — ABNORMAL HIGH (ref 70–99)
Glucose-Capillary: 151 mg/dL — ABNORMAL HIGH (ref 70–99)

## 2019-08-14 LAB — BASIC METABOLIC PANEL
Anion gap: 12 (ref 5–15)
BUN: 22 mg/dL — ABNORMAL HIGH (ref 6–20)
CO2: 32 mmol/L (ref 22–32)
Calcium: 9.5 mg/dL (ref 8.9–10.3)
Chloride: 94 mmol/L — ABNORMAL LOW (ref 98–111)
Creatinine, Ser: 1.15 mg/dL (ref 0.61–1.24)
GFR calc Af Amer: >60 mL/min (ref >60)
GFR calc non Af Amer: >60 mL/min (ref >60)
Glucose, Bld: 129 mg/dL — ABNORMAL HIGH (ref 70–99)
Potassium: 4 mmol/L (ref 3.5–5.1)
Sodium: 138 mmol/L (ref 135–145)

## 2019-08-14 LAB — CULTURE, BLOOD (ROUTINE X 2)
Culture: NO GROWTH
Culture: NO GROWTH
Special Requests: ADEQUATE

## 2019-08-14 LAB — CBC
HCT: 39.9 % (ref 39.0–52.0)
Hemoglobin: 12.8 g/dL — ABNORMAL LOW (ref 13.0–17.0)
MCH: 29.6 pg (ref 26.0–34.0)
MCHC: 32.1 g/dL (ref 30.0–36.0)
MCV: 92.4 fL (ref 80.0–100.0)
Platelets: 157 10*3/uL (ref 150–400)
RBC: 4.32 MIL/uL (ref 4.22–5.81)
RDW: 18.6 % — ABNORMAL HIGH (ref 11.5–15.5)
WBC: 8 10*3/uL (ref 4.0–10.5)
nRBC: 0.6 % — ABNORMAL HIGH (ref 0.0–0.2)

## 2019-08-14 LAB — PROTIME-INR
INR: 1.7 — ABNORMAL HIGH (ref 0.8–1.2)
Prothrombin Time: 19.6 seconds — ABNORMAL HIGH (ref 11.4–15.2)

## 2019-08-14 MED ORDER — WARFARIN SODIUM 4 MG PO TABS: 4.0000 mg | ORAL_TABLET | Freq: Every day | ORAL | 0 refills | Status: DC

## 2019-08-14 MED ORDER — NAPHAZOLINE-GLYCERIN 0.012-0.2 % OP SOLN: 1.0000 [drp] | Freq: Four times a day (QID) | OPHTHALMIC | 0 refills | Status: DC | PRN

## 2019-08-14 MED ORDER — WARFARIN SODIUM 4 MG PO TABS
4.0000 mg | ORAL_TABLET | Freq: Once | ORAL | Status: DC
  Filled 2019-08-14: qty 1

## 2019-08-14 MED ORDER — SULFAMETHOXAZOLE-TRIMETHOPRIM 800-160 MG PO TABS: 1.0000 | ORAL_TABLET | Freq: Two times a day (BID) | ORAL | 0 refills | Status: DC

## 2019-08-14 MED ORDER — THIAMINE HCL 100 MG PO TABS: 100.0000 mg | ORAL_TABLET | Freq: Every day | ORAL | 0 refills | Status: DC

## 2019-08-14 MED ORDER — PREDNISONE 10 MG PO TABS
ORAL_TABLET | ORAL | 0 refills | Status: DC
Start: 2019-08-14 — End: 2019-08-24

## 2019-08-14 MED ORDER — TORSEMIDE 20 MG PO TABS: 20.0000 mg | ORAL_TABLET | Freq: Two times a day (BID) | ORAL | 0 refills | Status: DC

## 2019-08-14 MED ORDER — OXYCODONE HCL 5 MG PO TABS: 5.0000 mg | ORAL_TABLET | Freq: Four times a day (QID) | ORAL | 0 refills | Status: DC | PRN

## 2019-08-14 NOTE — Telephone Encounter (Signed)
Thank you for continuing to try and follow up with him for care. I have been unable to reach him as well anytime I call. Continuing to get orders from [facility]. Last order read possible discharge due to noncompliance/ missed visit.  Thanks again.

## 2019-08-14 NOTE — Telephone Encounter (Signed)
Patient has still not scheduled an appointment as advised he has been back in the hospital and seen cardiology. Discharge letter has been sent previously. Has patient established with new PCP ?

## 2019-08-14 NOTE — Progress Notes (Signed)
SATURATION QUALIFICATIONS: (This note is used to comply with regulatory documentation for home oxygen)  Patient Saturations on Room Air at Rest = 83%  Patient Saturations on Room Air while Ambulating = 80%  Patient Saturations on3Liters of oxygen while Ambulating = 91%  Please briefly explain why patient needs home oxygen:

## 2019-08-14 NOTE — Consult Note (Addendum)
ANTICOAGULATION CONSULT NOTE  Pharmacy Consult for Warfarin Dosing  Indication: atrial fibrillation  No Known Allergies  Patient Measurements: Height: 5\' 8"  (172.7 cm) Weight: (!) 188.7 kg (416 lb) IBW/kg (Calculated) : 68.4  Vital Signs: Temp: 98.3 F (36.8 C) (06/29 0551) Temp Source: Oral (06/29 0551) BP: 123/82 (06/29 0551) Pulse Rate: 71 (06/29 0551)  Labs: Recent Labs    08/11/19 1022 08/11/19 1022 08/12/19 0647 08/12/19 1104 08/13/19 0715 08/14/19 0609  HGB 12.0*  --   --   --   --  12.8*  HCT 36.0*  --   --   --   --  39.9  PLT 149*  --   --   --   --  157  LABPROT 28.6*   < >  --  27.4* 25.2* 19.6*  INR 2.8*   < >  --  2.7* 2.4* 1.7*  CREATININE 0.81   < > 0.92  --  0.87 1.15   < > = values in this interval not displayed.    Estimated Creatinine Clearance: 115.4 mL/min (by C-G formula based on SCr of 1.15 mg/dL).   Medical History: Past Medical History:  Diagnosis Date  . Anxiety   . Arthritis   . Asthma   . Brain damage   . Chronic pain of both knees 07/13/2018  . Clotting disorder (HCC)   . COPD (chronic obstructive pulmonary disease) (HCC)   . Depression   . HFrEF (heart failure with reduced ejection fraction) (HCC)    a. 03/2018 Echo: EF 25-30%, diff HK. Mod LAE.  04/2018 History of DVT (deep vein thrombosis)   . History of pulmonary embolism    a. Chronic coumadin.  [name] Hypertension   . MI (myocardial infarction) (HCC)   . Morbid obesity (HCC)   . Neck pain 07/13/2018  . NICM (nonischemic cardiomyopathy) (HCC)    a. s/p Cath x 3 - reportedly nl cors. Last cath 2019 in [place]; b. a. 03/2018 Echo: EF 25-30%, diff HK.  04/2018 Persistent atrial fibrillation (HCC)    a. 03/2018 s/p DCCV; b. CHA2DS2VASc = 1-->Xarelto (later changed to warfarin); c. 05/2018 recurrent afib-->Amio initiated.  . Sleep apnea     Assessment: Pharmacy consulted for warfarin dosing and monitoring for 59 yo male with PMH of A. Fib. INR therapeutic on admission. DDI: cefepime, asa, APAP.    Home Regimen: Warfarin 5mg    DATE INR DOSE 6/24 2.0 5 mg 6/25 2.8 2.5 mg  6/26 2.8 3 mg 6/27 2.7 3 mg 6/28     2.4 3 mg 6/29  1.7   Goal of Therapy:  INR: 2-3   Plan:  INR therapeutic, but with slight downward trend.   Will give Warfarin 4 mg tonight (still avoiding full home dose per interaction with newly started Bactrim DS).   Daily INR ordered. CBC at least every 3 days.  Recommend f/u INR in 3 days if pt discharges.   7/28, PharmD, BCPS Clinical Pharmacist 08/14/2019 7:32 AM

## 2019-08-14 NOTE — Discharge Summary (Signed)
[facility] [place] [facility]   PATIENT NAME: [name] [name]    MR#:  [id number]  DATE OF BIRTH:  Dec 05, 1960  DATE OF ADMISSION:  08/09/2019 ADMITTING PHYSICIAN: Andris Baumann, MD  DATE OF DISCHARGE: 08/14/2019  3:06 PM  PRIMARY CARE PHYSICIAN: Berniece Pap, FNP    ADMISSION DIAGNOSIS:  Morbid obesity (HCC) [E66.01] Alcohol abuse [F10.10] Chronic pain syndrome [G89.4] Elevated troponin level [R77.8] Wound infection [T14.8XXA, L08.9] NSTEMI (non-ST elevated myocardial infarction) (HCC) [I21.4] Alcoholic intoxication with complication (HCC) [F10.929] Chest pain, unspecified type [R07.9] Sepsis, due to unspecified organism, unspecified whether acute organ dysfunction present (HCC) [A41.9]  DISCHARGE DIAGNOSIS:  Principal Problem:   Sepsis (HCC) Active Problems:   Acute on chronic respiratory failure with hypoxia (HCC)   Chronic combined systolic and diastolic heart failure (HCC)   COPD (chronic obstructive pulmonary disease) (HCC)   Obstructive sleep apnea   Lymphedema   Diet-controlled diabetes mellitus (HCC)   HTN (hypertension)   Morbid obesity with BMI of 60.0-69.9, adult (HCC)   Chronic pain syndrome   Neck pain   Cellulitis of both lower extremities   Chronic anticoagulation (warfarin   COUMADIN)   Troponin level elevated   Lower extremity ulceration, unspecified laterality, with fat layer exposed (HCC)   Alcoholic intoxication without complication (HCC)   Atrial fibrillation, chronic (HCC)   Alcohol withdrawal syndrome without complication (HCC)   SECONDARY DIAGNOSIS:   Past Medical History:  Diagnosis Date   Anxiety    Arthritis    Asthma    Brain damage    Chronic pain of both knees 07/13/2018   Clotting disorder (HCC)    COPD (chronic obstructive pulmonary disease) (HCC)    Depression    HFrEF (heart failure with reduced ejection fraction) (HCC)    a. 03/2018 Echo: EF 25-30%, diff HK. Mod LAE.   History of DVT  (deep vein thrombosis)    History of pulmonary embolism    a. Chronic coumadin.   Hypertension    MI (myocardial infarction) (HCC)    Morbid obesity (HCC)    Neck pain 07/13/2018   NICM (nonischemic cardiomyopathy) (HCC)    a. s/p Cath x 3 - reportedly nl cors. Last cath 2019 in [place]; b. a. 03/2018 Echo: EF 25-30%, diff HK.   Persistent atrial fibrillation (HCC)    a. 03/2018 s/p DCCV; b. CHA2DS2VASc = 1-->Xarelto (later changed to warfarin); c. 05/2018 recurrent afib-->Amio initiated.   Sleep apnea     HOSPITAL COURSE:   1.  Acute on now chronic hypoxic respiratory failure.  Patient now requires 3 L of oxygen with ambulation.  Pulse ox with ambulation did drop down to 80%.  With ambulating with 3 L kept at 91%.  2.  Clinical sepsis, present on admission with bilateral lower extremity ulcerations and surrounding erythema and cellulitis.  The patient initially was placed on Maxipime and vancomycin.  The wound care team put on Unna boot so when I evaluated his legs yesterday after taking off the Unna boots it was much improved.  Patient placed on oral Bactrim for another 4 days upon discharge home. 3.  Acute alcohol withdrawal.  This is improved during the hospital course.  Patient has improved with regards to his tremor.  Patient using alcohol for pain control as outpatient. 4.  Neck pain and shoulder pain.  MRI and CT scan of the neck was done while here in the hospital.  The patient was placed on steroids to try to break  down inflammation.  Patient will need continued physical therapy and Occupational Therapy as outpatient.  Patient will need pain management as outpatient.  Need a referral from PCP.  Only small prescription for pain medication prescribed. 5.  Acute on chronic diastolic congestive heart failure.  The patient already on metoprolol.  Outpatient medications had him on Demadex at home but very high dose and I put him back on Demadex yesterday 40 mg and he stated he urinated more  than he ever urinated so I do not think that he does take the full dose at home.  Change Demadex to 20 mg twice a day.  CHF clinic as outpatient 6.  Chronic atrial fibrillation on Coumadin.  INR was therapeutic during the entire course but did dip down to 1.7 today.  Continue 4 mg of Coumadin nightly.  Home health to draw INR is a weekly with results to PMD.  Metoprolol for rate control. 7.  Obstructive sleep apnea on CPAP at night and with naps. 8.  Morbid obesity with a BMI of 63.25.  Weight loss needed 9.  Creatinine did creep up a little bit with diuresis twice a day yesterday.  Recommend checking a BMP and follow-up appointment.  DISCHARGE CONDITIONS:   Satisfactory  CONSULTS OBTAINED:  Cardiology Wound care  DRUG ALLERGIES:  No Known Allergies  DISCHARGE MEDICATIONS:   Allergies as of 08/14/2019   No Known Allergies     Medication List    STOP taking these medications   ALPRAZolam 0.5 MG tablet Commonly known as: XANAX   HYDROcodone-acetaminophen 5-325 MG tablet Commonly known as: NORCO/VICODIN   meloxicam 15 MG tablet Commonly known as: MOBIC     TAKE these medications   albuterol 108 (90 Base) MCG/ACT inhaler Commonly known as: VENTOLIN HFA Inhale 2 puffs into the lungs every 6 (six) hours as needed for wheezing or shortness of breath.   allopurinol 100 MG tablet Commonly known as: ZYLOPRIM Take 1 tablet (100 mg total) by mouth daily.   atorvastatin 40 MG tablet Commonly known as: LIPITOR Take 40 mg by mouth daily.   colchicine 0.6 MG tablet 1 tab PO q 12 hrs as needed until gout flare subsides, schedule on office follow up What changed:  how much to take how to take this when to take this additional instructions   DULoxetine 20 MG capsule Commonly known as: CYMBALTA Take 1 capsule (20 mg total) by mouth daily.   ferrous sulfate 325 (65 FE) MG tablet Take 1 tablet (325 mg total) by mouth 2 (two) times daily with a meal.   fluticasone 50 MCG/ACT  nasal spray Commonly known as: FLONASE Place 2 sprays into both nostrils 2 (two) times daily.   gabapentin 300 MG capsule Commonly known as: NEURONTIN Take 1 capsule (300 mg total) by mouth 3 (three) times daily.   levothyroxine 50 MCG tablet Commonly known as: SYNTHROID Take 1 tablet (50 mcg total) by mouth daily before breakfast. Need lab work beginning of June for Thyroid   lisinopril 2.5 MG tablet Commonly known as: ZESTRIL Take 1 tablet (2.5 mg total) by mouth daily.   loperamide 2 MG capsule Commonly known as: IMODIUM Take 1 capsule (2 mg total) by mouth as needed for diarrhea or loose stools.   metoprolol succinate 50 MG 24 hr tablet Commonly known as: TOPROL-XL Take 1 tablet (50 mg total) by mouth daily. Take with or immediately following a meal.   naphazoline-glycerin 0.012-0.2 % Soln Commonly known as: CLEAR EYES REDNESS Place  1-2 drops into both eyes 4 (four) times daily as needed for eye irritation.   nitroGLYCERIN 0.4 MG SL tablet Commonly known as: NITROSTAT Place 1 tablet under tongue every 5 minutes as needed for chest pain. (No more than 3 doses within 15 minutes)   oxyCODONE 5 MG immediate release tablet Commonly known as: Oxy IR/ROXICODONE Take 1 tablet (5 mg total) by mouth every 6 (six) hours as needed for moderate pain or severe pain.   predniSONE 10 MG tablet Commonly known as: DELTASONE 3 tabs po daily for three days then 2 tabs po daily for three days, then 1 tab po daily for five days   sulfamethoxazole-trimethoprim 800-160 MG tablet Commonly known as: BACTRIM DS Take 1 tablet by mouth every 12 (twelve) hours.   thiamine 100 MG tablet Take 1 tablet (100 mg total) by mouth daily.   torsemide 20 MG tablet Commonly known as: DEMADEX Take 1 tablet (20 mg total) by mouth 2 (two) times daily. Start taking on: August 15, 2019 What changed: how much to take   traZODone 150 MG tablet Commonly known as: DESYREL TAKE 1 TABLET BY MOUTH AT BEDTIME AS  NEEDED SLEEP   warfarin 4 MG tablet Commonly known as: COUMADIN Take 1 tablet (4 mg total) by mouth daily at 4 PM. What changed:  medication strength how much to take when to take this            Durable Medical Equipment  (From admission, onward)         Start     Ordered   08/14/19 1119  For home use only DME oxygen  Once       Question Answer Comment  Length of Need Lifetime   Mode or (Route) Nasal cannula   Liters per Minute 3   Frequency Continuous (stationary and portable oxygen unit needed)   Oxygen conserving device Yes   Oxygen delivery system Gas      08/14/19 1118           DISCHARGE INSTRUCTIONS:   Follow-up PMD 5 days Follow-up cardiology 1 week Will need a referral to pain management from PMD Wound care center follow-up  If you experience worsening of your admission symptoms, develop shortness of breath, life threatening emergency, suicidal or homicidal thoughts you must seek medical attention immediately by calling 911 or calling your MD immediately  if symptoms less severe.  You Must read complete instructions/literature along with all the possible adverse reactions/side effects for all the Medicines you take and that have been prescribed to you. Take any new Medicines after you have completely understood and accept all the possible adverse reactions/side effects.   Please note  You were cared for by a hospitalist during your hospital stay. If you have any questions about your discharge medications or the care you received while you were in the hospital after you are discharged, you can call the unit and asked to speak with the hospitalist on call if the hospitalist that took care of you is not available. Once you are discharged, your primary care physician will handle any further medical issues. Please note that NO REFILLS for any discharge medications will be authorized once you are discharged, as it is imperative that you return to your primary  care physician (or establish a relationship with a primary care physician if you do not have one) for your aftercare needs so that they can reassess your need for medications and monitor your lab values.  Today   CHIEF COMPLAINT:   Chief Complaint  Patient presents with   Chest Pain    HISTORY OF PRESENT ILLNESS:  Ermine Spofford  is a 59 y.o. male came in with leg pain   VITAL SIGNS:  Blood pressure 106/67, pulse 86, temperature 98.8 F (37.1 C), resp. rate 18, height 5\' 8"  (1.727 m), weight (!) 188.7 kg, SpO2 100 %.  I/O:    Intake/Output Summary (Last 24 hours) at 08/14/2019 1643 Last data filed at 08/14/2019 1345 Gross per 24 hour  Intake 480 ml  Output 3800 ml  Net -3320 ml    PHYSICAL EXAMINATION:  GENERAL:  59 y.o.-year-old patient lying in the bed with no acute distress.  EYES: Pupils equal, round, reactive to light and accommodation. No scleral icterus. Extraocular muscles intact.  HEENT: Head atraumatic, normocephalic. Oropharynx and nasopharynx clear.   LUNGS: Decreased breath sounds bilateral, no wheezing, rales,rhonchi or crepitation. No use of accessory muscles of respiration.  CARDIOVASCULAR: S1, S2 irregularly irregular. No murmurs.  ABDOMEN: Soft, non-tender, non-distended.  EXTREMITIES: 3+ pedal edema.  NEUROLOGIC: Cranial nerves II through XII are intact. Muscle strength 5/5 in all extremities. Sensation intact. Gait not checked.  PSYCHIATRIC: The patient is alert and oriented x 3.  SKIN: The redness on bilateral lower extremities has improved he does have chronic discoloration bilateral lower extremities with ulcers bilaterally  DATA REVIEW:   CBC Recent Labs  Lab 08/14/19 0609  WBC 8.0  HGB 12.8*  HCT 39.9  PLT 157    Chemistries  Recent Labs  Lab 08/09/19 0346 08/09/19 0914 08/10/19 0520 08/11/19 1022 08/14/19 0609  NA 136  --  137   < > 138  K 3.7  --  3.7   < > 4.0  CL 91*  --  94*   < > 94*  CO2 28  --  31   < > 32  GLUCOSE  134*  --  115*   < > 129*  BUN 12  --  11   < > 22*  CREATININE 1.03  --  0.89   < > 1.15  CALCIUM 8.0*  --  8.1*   < > 9.5  MG 2.2   < > 1.7  --   --   AST 77*  --   --   --   --   ALT 44  --   --   --   --   ALKPHOS 107  --   --   --   --   BILITOT 0.8  --   --   --   --    < > = values in this interval not displayed.     Microbiology Results  Results for orders placed or performed during the hospital encounter of 08/09/19  SARS Coronavirus 2 by RT PCR (hospital order, performed in [facility] hospital lab) Nasopharyngeal Nasopharyngeal Swab     Status: None   Collection Time: 08/09/19  3:46 AM   Specimen: Nasopharyngeal Swab  Result Value Ref Range Status   SARS Coronavirus 2 NEGATIVE NEGATIVE Final    Comment: (NOTE) SARS-CoV-2 target nucleic acids are NOT DETECTED.  The SARS-CoV-2 RNA is generally detectable in upper and lower respiratory specimens during the acute phase of infection. The lowest concentration of SARS-CoV-2 viral copies this assay can detect is 250 copies / mL. A negative result does not preclude SARS-CoV-2 infection and should not be used as the sole basis for treatment or other patient management  decisions.  A negative result may occur with improper specimen collection / handling, submission of specimen other than nasopharyngeal swab, presence of viral mutation(s) within the areas targeted by this assay, and inadequate number of viral copies (<250 copies / mL). A negative result must be combined with clinical observations, patient history, and epidemiological information.  Fact Sheet for Patients:   BoilerBrush.com.cy  Fact Sheet for Healthcare Providers: https://pope.com/  This test is not yet approved or  cleared by the [place] FDA and has been authorized for detection and/or diagnosis of SARS-CoV-2 by FDA under an Emergency Use Authorization (EUA).  This EUA will remain in effect (meaning this  test can be used) for the duration of the COVID-19 declaration under Section 564(b)(1) of the Act, 21 U.S.C. section 360bbb-3(b)(1), unless the authorization is terminated or revoked sooner.  Performed at [facility], [address] [place], [place] [address]   Blood Culture (routine x 2)     Status: None   Collection Time: 08/09/19  3:46 AM   Specimen: BLOOD  Result Value Ref Range Status   Specimen Description BLOOD RIGHT [facility]  Final   Special Requests   Final    BOTTLES DRAWN AEROBIC AND ANAEROBIC Blood Culture adequate volume   Culture   Final    NO GROWTH 5 DAYS Performed at [facility], [address] [place], [place] [address]    Report Status 08/14/2019 FINAL  Final  Blood Culture (routine x 2)     Status: None   Collection Time: 08/09/19  3:46 AM   Specimen: BLOOD  Result Value Ref Range Status   Specimen Description BLOOD LEFT HAND  Final   Special Requests   Final    BOTTLES DRAWN AEROBIC AND ANAEROBIC Blood Culture results may not be optimal due to an excessive volume of blood received in culture bottles   Culture   Final    NO GROWTH 5 DAYS Performed at [facility], [address], [place], [place] [address]    Report Status 08/14/2019 FINAL  Final  Urine Culture     Status: Abnormal   Collection Time: 08/09/19  9:14 AM   Specimen: Urine, Clean Catch  Result Value Ref Range Status   Specimen Description   Final    URINE, CLEAN CATCH Performed at [facility], [address], [place], [place] [address]    Special Requests   Final    Normal Performed at [facility], [address] [place], [place] [address]    Culture (A)  Final    10,000 COLONIES/mL MULTIPLE SPECIES PRESENT, SUGGEST RECOLLECTION   Report Status 08/11/2019 FINAL  Final  MRSA PCR Screening     Status: Abnormal   Collection Time: 08/09/19  5:18 PM   Specimen: Nasopharyngeal  Result Value Ref Range Status   MRSA by PCR POSITIVE (A)  NEGATIVE Final    Comment:        The GeneXpert MRSA Assay (FDA approved for NASAL specimens only), is one component of a comprehensive MRSA colonization surveillance program. It is not intended to diagnose MRSA infection nor to guide or monitor treatment for MRSA infections. RESULT CALLED TO, READ BACK BY AND VERIFIED WITH: TRACY BENNETTE 08/09/19 @1932  BY ACR Performed at [facility], [address] [place], [place]      Management plans discussed with the patient, and he is in agreement.  CODE STATUS:     Code Status Orders  (From admission, onward)  Start     Ordered   08/09/19 0615  Full code  Continuous        08/09/19 0621        Code Status History    Date Active Date Inactive Code Status Order ID Comments User Context   06/22/2019 1531 06/26/2019 1742 Full Code [id number]  Lorretta Harp, MD Inpatient   03/04/2019 2314 03/05/2019 2105 Full Code [id number]  Anselm Jungling, DO ED   10/24/2018 0420 10/28/2018 1849 Full Code [id number]  Oralia Manis, MD Inpatient   08/06/2018 1815 08/10/2018 2042 Full Code [id number]  Alford Highland, MD ED   07/18/2018 0716 07/23/2018 1827 Full Code [id number]  Arnaldo Natal, MD ED   05/29/2018 1453 06/01/2018 1931 Full Code [id number]  Salary, Evelena Asa, MD Inpatient   03/17/2018 0042 03/25/2018 1554 Full Code [id number]  Altamese Dilling, MD ED   02/24/2018 0725 02/27/2018 1555 Full Code [id number]  Oralia Manis, MD Inpatient   01/27/2018 1345 01/29/2018 1406 Full Code [id number]  Alford Highland, MD ED   01/17/2018 1330 01/19/2018 1818 Partial Code [id number]  Milagros Loll, MD Inpatient   01/16/2018 1427 01/17/2018 1330 Full Code [id number]  Auburn Bilberry, MD ED   Advance Care Planning Activity      TOTAL TIME TAKING CARE OF THIS PATIENT: 35 minutes.    Alford Highland M.D on 08/14/2019 at 4:43 PM  Between 7am to 6pm - Pager - [phone]  After 6pm go to [email or web address] - password [email or web address]  [facility]  CC: Primary care physician; Flinchum, [name], FNP

## 2019-08-14 NOTE — Progress Notes (Signed)
HENRRY, FEIL ([id number]) Visit Report for 06/28/2019 Arrival Information Details Patient Name: [name] [name], [name] [name] Date of Service: 06/28/2019 1:45 PM Medical Record Number: [id number] Patient Account Number: [id number] Date of Birth/Sex: 09-Feb-1961 (59 y.o. Male) Treating RN: Rodell Perna Primary Care Tyishia Aune: Marvell Fuller Other Clinician: Referring Haley Roza: Marvell Fuller Treating Lynnet Hefley/Extender: Altamese [place] in Treatment: 7 Visit Information History Since Last Visit Added or deleted any medications: No Patient Arrived: Walker Any new allergies or adverse reactions: No Arrival Time: 13:39 Had a fall or experienced change in No Accompanied By: self activities of daily living that may affect Transfer Assistance: None risk of falls: Patient Identification Verified: Yes Signs or symptoms of abuse/neglect since last visito No Secondary Verification Process Completed: Yes Hospitalized since last visit: No Patient Has Alerts: Yes Implantable device outside of the clinic excluding No Patient Alerts: Patient on Blood Thinner cellular tissue based products placed in the center Warfarin since last visit: Has Dressing in Place as Prescribed: Yes Has Compression in Place as Prescribed: Yes Pain Present Now: Yes Electronic Signature(s) Signed: 06/28/2019 4:38:04 PM By: Dayton Martes RCP, RRT, CHT Entered By: Dayton Martes on 06/28/2019 13:43:28 [name] [name] ([id number]) -------------------------------------------------------------------------------- Compression Therapy Details Patient Name: [name] [name] Date of Service: 06/28/2019 1:45 PM Medical Record Number: [id number] Patient Account Number: [id number] Date of Birth/Sex: [birth year] (59 y.o. Male) Treating RN: Rodell Perna Primary Care Kaydyn Chism: Marvell Fuller Other Clinician: Referring Da Michelle: Marvell Fuller Treating Luman Holway/Extender: Maxwell Caul Weeks  in Treatment: 7 Compression Therapy Performed for Wound Assessment: Wound #3 Right,Proximal,Lateral Lower Leg Performed By: Clinician Rodell Perna, RN Compression Type: Henriette Combs Post Procedure Diagnosis Same as Pre-procedure Electronic Signature(s) Signed: 06/28/2019 4:45:22 PM By: Rodell Perna Entered By: Rodell Perna on 06/28/2019 14:44:05 [name] [name] ([id number]) -------------------------------------------------------------------------------- Compression Therapy Details Patient Name: [name] [name] Date of Service: 06/28/2019 1:45 PM Medical Record Number: [id number] Patient Account Number: [id number] Date of Birth/Sex: 09/27/1960 (59 y.o. Male) Treating RN: Rodell Perna Primary Care Otis Burress: Marvell Fuller Other Clinician: Referring Shiya Fogelman: Marvell Fuller Treating Teagyn Fishel/Extender: Maxwell Caul Weeks in Treatment: 7 Compression Therapy Performed for Wound Assessment: Wound #5 Left,Lateral Lower Leg Performed By: Clinician Rodell Perna, RN Compression Type: Henriette Combs Post Procedure Diagnosis Same as Pre-procedure Electronic Signature(s) Signed: 06/28/2019 4:45:22 PM By: Rodell Perna Entered By: Rodell Perna on 06/28/2019 14:44:05 [name] [name] ([id number]) -------------------------------------------------------------------------------- Compression Therapy Details Patient Name: [name] [name] Date of Service: 06/28/2019 1:45 PM Medical Record Number: [id number] Patient Account Number: [id number] Date of Birth/Sex: [birth year] (59 y.o. Male) Treating RN: Rodell Perna Primary Care Hale Chalfin: Marvell Fuller Other Clinician: Referring Aniyla Harling: Marvell Fuller Treating Rakayla Ricklefs/Extender: Maxwell Caul Weeks in Treatment: 7 Compression Therapy Performed for Wound Assessment: Wound #7 Right,Anterior Lower Leg Performed By: Clinician Rodell Perna, RN Compression Type: Henriette Combs Post Procedure Diagnosis Same as Pre-procedure Electronic  Signature(s) Signed: 06/28/2019 4:45:22 PM By: Rodell Perna Entered By: Rodell Perna on 06/28/2019 14:44:05 [name] [name] ([id number]) -------------------------------------------------------------------------------- Encounter Discharge Information Details Patient Name: [name] [name] Date of Service: 06/28/2019 1:45 PM Medical Record Number: [id number] Patient Account Number: [id number] Date of Birth/Sex: [birth year] (59 y.o. Male) Treating RN: Rodell Perna Primary Care Araya Roel: Marvell Fuller Other Clinician: Referring Kobi Aller: Marvell Fuller Treating Mykelti Goldenstein/Extender: Altamese [place] in Treatment: 7 Encounter Discharge Information Items Post Procedure Vitals Discharge Condition: Stable Temperature (F): 99.5 Ambulatory Status: Walker Pulse (bpm): 85 Discharge Destination: Home Respiratory Rate (breaths/min): 16 Transportation: Private Auto Blood Pressure (mmHg): 128/68 Accompanied By: self Schedule Follow-up Appointment: Yes Clinical Summary of Care:  Electronic Signature(s) Signed: 06/28/2019 4:45:22 PM By: Rodell Perna Entered By: Rodell Perna on 06/28/2019 14:47:11 [name] [name] ([id number]) -------------------------------------------------------------------------------- Lower Extremity Assessment Details Patient Name: [name] [name] Date of Service: 06/28/2019 1:45 PM Medical Record Number: [id number] Patient Account Number: [id number] Date of Birth/Sex: [birth year] (59 y.o. Male) Treating RN: Huel Coventry Primary Care Shakea Isip: Marvell Fuller Other Clinician: Referring Vassie Kugel: Marvell Fuller Treating Sofia Vanmeter/Extender: Maxwell Caul Weeks in Treatment: 7 Edema Assessment Assessed: [Left: No] [Right: No] Edema: [Left: Yes] [Right: Yes] Calf Left: Right: Point of Measurement: 31 cm From Medial Instep 56 cm 61 cm Ankle Left: Right: Point of Measurement: 13 cm From Medial Instep 35 cm 35 cm Vascular Assessment Pulses: Dorsalis  Pedis Palpable: [Left:Yes] [Right:Yes] Electronic Signature(s) Signed: 08/14/2019 12:56:55 PM By: Elliot Gurney, BSN, RN, CWS, Kim RN, BSN Entered By: Elliot Gurney, BSN, RN, CWS, Kim on 06/28/2019 13:54:36 [name] [name] ([id number]) -------------------------------------------------------------------------------- Multi Wound Chart Details Patient Name: [name] [name] Date of Service: 06/28/2019 1:45 PM Medical Record Number: [id number] Patient Account Number: [id number] Date of Birth/Sex: 1960-02-18 (59 y.o. 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 Comorbid History: Asthma, Chronic Obstructive Asthma, Chronic Obstructive Asthma, Chronic Obstructive Pulmonary Disease (COPD), Sleep Pulmonary Disease (COPD), Sleep Pulmonary Disease (COPD), Sleep Apnea, Arrhythmia, Congestive Apnea, Arrhythmia, Congestive Apnea, Arrhythmia, Congestive Heart Failure, Hypertension, Heart Failure, Hypertension, Heart Failure, Hypertension, Myocardial Infarction, Type II Myocardial Infarction, Type II Myocardial Infarction, Type II Diabetes Diabetes Diabetes Date Acquired: 02/16/2019 02/12/2019 05/17/2019 Weeks of Treatment: 7 7 2  Wound Status: Open Open Open Clustered Wound: No Yes No Clustered Quantity: N/A 2 N/A Measurements L x W x D (cm) 0.4x0.3x0.1 4.7x9.5x0.2 3.8x7.1x0.2 Area (cm) : 0.094 35.068 21.19 Volume (cm) : 0.009  7.014 4.238 % Reduction in Area: 75.10% -77.20% 72.50% % Reduction in Volume: 76.30% -254.40% 72.50% Classification: Grade 1 Full Thickness Without Exposed Full Thickness Without Exposed Support Structures Support Structures Exudate Amount: Medium Medium Medium Exudate Type: Serous Serous Serosanguineous Exudate Color: amber amber red, brown Wound Margin: Flat and Intact Flat and Intact Flat and Intact Granulation Amount: Medium (34-66%) Medium (34-66%) Small (1-33%) Granulation Quality: Pink Pink Pink Necrotic Amount: Medium (34-66%) Medium (34-66%) Large (67-100%) Exposed Structures: Fat Layer (Subcutaneous Tissue) Fat Layer (Subcutaneous Tissue) Fat Layer (Subcutaneous Tissue) Exposed: Yes Exposed: Yes Exposed: Yes Fascia: No Fascia: No Fascia: No Tendon: No Tendon: No Tendon: No Muscle: No Muscle: No Muscle: No Joint: No Joint: No Joint: No Bone: No Bone: No Bone: No Epithelialization: Small (1-33%) None None Debridement: N/A Debridement - Excisional Debridement - Excisional Pre-procedure Verification/Time N/A 14:36 14:36 Out Taken: Tissue Debrided: N/A Subcutaneous, Slough Subcutaneous, Slough Level: N/A Skin/Subcutaneous Tissue Skin/Subcutaneous Tissue Debridement Area (sq cm): N/A 44.65 26.98 Hagemeister, Nazair ( ) Instrument: N/A Curette Curette Bleeding: N/A Minimum Minimum Hemostasis Achieved: N/A Pressure Pressure Debridement Treatment N/A Procedure was tolerated well Procedure was tolerated well Response: Post Debridement N/A 4.7x9.5x0.2 3.8x7.1x0.2 Measurements L x W x D (cm) Post Debridement Volume: N/A 7.014 4.238 (cm) Procedures Performed: Compression Therapy Compression Therapy Compression Therapy Debridement Debridement Treatment Notes Electronic Signature(s) Signed: 06/28/2019 5:28:47 PM By: 06/30/2019,  Casimiro Needle MD Entered By: Baltazar Najjar on 06/28/2019 14:44:51 [name] [name]  ([id number]) -------------------------------------------------------------------------------- Multi-Disciplinary Care Plan Details Patient Name: [name] [name] Date of Service: 06/28/2019 1:45 PM Medical Record Number: [id number] Patient Account Number: [id number] Date of Birth/Sex: 1960/07/31 (59 y.o. Male) Treating RN: Rodell Perna Primary Care Lianni Kanaan: Marvell Fuller Other Clinician: Referring Van Ehlert: Marvell Fuller Treating Gerica Koble/Extender: Altamese [place] in Treatment: 7 Active Inactive Abuse / Safety / Falls / Self Care Management Nursing Diagnoses: Potential for falls Goals: Patient will remain injury free related to falls Date Initiated: 05/04/2019 Target Resolution Date: 07/21/2019 Goal Status: Active Interventions: Assess fall risk on admission and as needed Provide education on basic hygiene Notes: Nutrition Nursing Diagnoses: Impaired glucose control: actual or potential Goals: Patient/caregiver agrees to and verbalizes understanding of need to use nutritional supplements and/or vitamins as prescribed Date Initiated: 05/04/2019 Target Resolution Date: 07/21/2019 Goal Status: Active Interventions: Assess patient nutrition upon admission and as needed per policy Notes: Orientation to the Wound Care Program Nursing Diagnoses: Knowledge deficit related to the wound healing center program Goals: Patient/caregiver will verbalize understanding of the Wound Healing Center Program Date Initiated: 05/04/2019 Target Resolution Date: 07/21/2019 Goal Status: Active Interventions: Provide education on orientation to the wound center Notes: Venous Leg Ulcer Nursing Diagnoses: Potential for venous Insuffiency (use before diagnosis confirmed) Goals: Patient will maintain optimal edema control Date Initiated: 05/04/2019 Target Resolution Date: 07/21/2019 Goal Status: Active [name] [name], [name] [name] ([id number]) Interventions: Compression as ordered Notes: Wound/Skin  Impairment Nursing Diagnoses: Impaired tissue integrity Goals: Ulcer/skin breakdown will heal within 14 weeks Date Initiated: 05/04/2019 Target Resolution Date: 07/21/2019 Goal Status: Active Interventions: Assess patient/caregiver ability to obtain necessary supplies Assess patient/caregiver ability to perform ulcer/skin care regimen upon admission and as needed Assess ulceration(s) every visit Notes: Electronic Signature(s) Signed: 06/28/2019 4:45:22 PM By: Rodell Perna Entered By: Rodell Perna on 06/28/2019 14:37:15 [name] [name] ([id number]) -------------------------------------------------------------------------------- Pain Assessment Details Patient Name: [name] [name] Date of Service: 06/28/2019 1:45 PM Medical Record Number: [id number] Patient Account Number: [id number] Date of Birth/Sex: 10/23/1960 (59 y.o. Male) Treating RN: Huel Coventry Primary Care Saliah Crisp: Marvell Fuller Other Clinician: Referring Estreya Clay: Marvell Fuller Treating Esau Fridman/Extender: Maxwell Caul Weeks in Treatment: 7 Active Problems Location of Pain Severity and Description of Pain Patient Has Paino Yes Site Locations Pain Location: Pain in Ulcers With Dressing Change: No Character of Pain Describe the Pain: Burning, Throbbing Pain Management and Medication Current Pain Management: Notes Patient states he hurts all over. Legs are burning and throbbing. Electronic Signature(s) Signed: 08/14/2019 12:56:55 PM By: Elliot Gurney, BSN, RN, CWS, Kim RN, BSN Entered By: Elliot Gurney, BSN, RN, CWS, Kim on 06/28/2019 13:51:54 [name] [name] ([id number]) -------------------------------------------------------------------------------- Patient/Caregiver Education Details Patient Name: [name] [name] Date of Service: 06/28/2019 1:45 PM Medical Record Number: [id number] Patient Account Number: [id number] Date of Birth/Gender: [birth year] (59 y.o. Male) Treating RN: Rodell Perna Primary Care Physician:  Marvell Fuller Other Clinician: Referring Physician: Marvell Fuller Treating Physician/Extender: Altamese [place] in Treatment: 7 Education Assessment Education Provided To: Patient Education Topics Provided Wound/Skin Impairment: Handouts: Caring for Your Ulcer Methods: Demonstration, Explain/Verbal Responses: State content correctly Electronic Signature(s) Signed: 06/28/2019 4:45:22 PM By: Rodell Perna Entered By: Rodell Perna on 06/28/2019 14:46:23 [name] [name] ([id number]) -------------------------------------------------------------------------------- Wound Assessment Details Patient Name: [name] [name] Date of Service: 06/28/2019 1:45 PM Medical Record Number: [id number] Patient Account Number: [id number] Date of Birth/Sex: [birth year] (59 y.o. Male) Treating RN: Huel Coventry Primary Care Deontrey Massi: Marvell Fuller Other Clinician: Referring Manuelita Moxon: Marvell Fuller Treating Jerrad Mendibles/Extender: Altamese [place]  in Treatment: 7 Wound Status Wound Number: 3 Primary Diabetic Wound/Ulcer of the Lower Extremity Etiology: Wound Location: Right, Proximal, Lateral Lower Leg Wound Open Wounding Event: Gradually Appeared Status: Date Acquired: 02/16/2019 Comorbid Asthma, Chronic Obstructive Pulmonary Disease (COPD), Weeks Of Treatment: 7 History: Sleep Apnea, Arrhythmia, Congestive Heart Failure, Clustered Wound: No Hypertension, Myocardial Infarction, Type II Diabetes Photos Wound Measurements Length: (cm) 0.4 Width: (cm) 0.3 Depth: (cm) 0.1 Area: (cm) 0.094 Volume: (cm) 0.009 % Reduction in Area: 75.1% % Reduction in Volume: 76.3% Epithelialization: Small (1-33%) Wound Description Classification: Grade 1 Wound Margin: Flat and Intact Exudate Amount: Medium Exudate Type: Serous Exudate Color: amber Foul Odor After Cleansing: No Slough/Fibrino Yes Wound Bed Granulation Amount: Medium (34-66%) Exposed Structure Granulation Quality:  Pink Fascia Exposed: No Necrotic Amount: Medium (34-66%) Fat Layer (Subcutaneous Tissue) Exposed: Yes Necrotic Quality: Adherent Slough Tendon Exposed: No Muscle Exposed: No Joint Exposed: No Bone Exposed: No Electronic Signature(s) Signed: 08/14/2019 12:56:55 PM By: Elliot Gurney, BSN, RN, CWS, Kim RN, BSN Entered By: Elliot Gurney, BSN, RN, CWS, Kim on 06/28/2019 13:59:26 [name] [name] ([id number]) -------------------------------------------------------------------------------- Wound Assessment Details Patient Name: [name] [name] Date of Service: 06/28/2019 1:45 PM Medical Record Number: [id number] Patient Account Number: [id number] Date of Birth/Sex: 1960/07/23 (59 y.o. Male) Treating RN: Huel Coventry Primary Care Rut Betterton: Marvell Fuller Other Clinician: Referring Bluford Sedler: Marvell Fuller Treating Merrick Maggio/Extender: Maxwell Caul Weeks in Treatment: 7 Wound Status Wound Number: 5 Primary Lymphedema Etiology: Wound Location: Left, Lateral Lower Leg Wound Open Wounding Event: Gradually Appeared Status: Date Acquired: 02/12/2019 Comorbid Asthma, Chronic Obstructive Pulmonary Disease (COPD), Weeks Of Treatment: 7 History: Sleep Apnea, Arrhythmia, Congestive Heart Failure, Clustered Wound: Yes Hypertension, Myocardial Infarction, Type II Diabetes Photos Wound Measurements Length: (cm) 4.7 Width: (cm) 9.5 Depth: (cm) 0.2 Clustered Quantity: 2 Area: (cm) 35.068 Volume: (cm) 7.014 % Reduction in Area: -77.2% % Reduction in Volume: -254.4% Epithelialization: None Wound Description Classification: Full Thickness Without Exposed Support Structu Wound Margin: Flat and Intact Exudate Amount: Medium Exudate Type: Serous Exudate Color: amber res Foul Odor After Cleansing: No Slough/Fibrino Yes Wound Bed Granulation Amount: Medium (34-66%) Exposed Structure Granulation Quality: Pink Fascia Exposed: No Necrotic Amount: Medium (34-66%) Fat Layer (Subcutaneous Tissue)  Exposed: Yes Necrotic Quality: Adherent Slough Tendon Exposed: No Muscle Exposed: No Joint Exposed: No Bone Exposed: No Electronic Signature(s) Signed: 08/14/2019 12:56:55 PM By: Elliot Gurney, BSN, RN, CWS, Kim RN, BSN Entered By: Elliot Gurney, BSN, RN, CWS, Kim on 06/28/2019 13:59:57 [name] [name] ([id number]) -------------------------------------------------------------------------------- Wound Assessment Details Patient Name: [name] [name] Date of Service: 06/28/2019 1:45 PM Medical Record Number: [id number] Patient Account Number: [id number] Date of Birth/Sex: 25-Feb-1960 (59 y.o. Male) Treating RN: Huel Coventry Primary Care Chantel Teti: Marvell Fuller Other Clinician: Referring Giuliana Handyside: Marvell Fuller Treating Kalayah Leske/Extender: Maxwell Caul Weeks in Treatment: 7 Wound Status Wound Number: 7 Primary Venous Leg Ulcer Etiology: Wound Location: Right, Anterior Lower Leg Wound Open Wounding Event: Gradually Appeared Status: Date Acquired: 05/17/2019 Comorbid Asthma, Chronic Obstructive Pulmonary Disease (COPD), Weeks Of Treatment: 2 History: Sleep Apnea, Arrhythmia, Congestive Heart Failure, Clustered Wound: No Hypertension, Myocardial Infarction, Type II Diabetes Photos Wound Measurements Length: (cm) 3.8 Width: (cm) 7.1 Depth: (cm) 0.2 Area: (cm) 21.19 Volume: (cm) 4.238 % Reduction in Area: 72.5% % Reduction in Volume: 72.5% Epithelialization: None Wound Description Classification: Full Thickness Without Exposed Support Structu Wound Margin: Flat and Intact Exudate Amount: Medium Exudate Type: Serosanguineous Exudate Color: red, brown res Foul Odor After Cleansing: No Slough/Fibrino Yes Wound Bed Granulation Amount: Small (1-33%) Exposed Structure Granulation Quality: Pink  Fascia Exposed: No Necrotic Amount: Large (67-100%) Fat Layer (Subcutaneous Tissue) Exposed: Yes Necrotic Quality: Adherent Slough Tendon Exposed: No Muscle Exposed: No Joint Exposed:  No Bone Exposed: No Electronic Signature(s) Signed: 08/14/2019 12:56:55 PM By: Elliot Gurney, BSN, RN, CWS, Kim RN, BSN Entered By: Elliot Gurney, BSN, RN, CWS, Kim on 06/28/2019 14:00:34 [name] [name] ([id number]) -------------------------------------------------------------------------------- Vitals Details Patient Name: [name] [name] Date of Service: 06/28/2019 1:45 PM Medical Record Number: [id number] Patient Account Number: [id number] Date of Birth/Sex: 1960-10-15 (59 y.o. Male) Treating RN: Rodell Perna Primary Care Donis Kotowski: Marvell Fuller Other Clinician: Referring Hopelynn Gartland: Marvell Fuller Treating Tailynn Armetta/Extender: Altamese [place] in Treatment: 7 Vital Signs Time Taken: 13:40 Temperature (F): 99.5 Height (in): 68 Pulse (bpm): 85 Weight (lbs): 400 Respiratory Rate (breaths/min): 20 Body Mass Index (BMI): 60.8 Blood Pressure (mmHg): 128/68 Reference Range: 80 - 120 mg / dl Electronic Signature(s) Signed: 06/28/2019 4:38:04 PM By: Dayton Martes RCP, RRT, CHT Entered By: Dayton Martes on 06/28/2019 13:45:30

## 2019-08-14 NOTE — Discharge Instructions (Signed)
Alcohol Use Disorder °Alcohol use disorder is when your drinking disrupts your daily life. When you have this condition, you drink too much alcohol and you cannot control your drinking. °Alcohol use disorder can cause serious problems with your physical health. It can affect your brain, heart, liver, pancreas, immune system, stomach, and intestines. Alcohol use disorder can increase your risk for certain cancers and cause problems with your mental health, such as depression, anxiety, psychosis, delirium, and dementia. People with this disorder risk hurting themselves and others. °What are the causes? °This condition is caused by drinking too much alcohol over time. It is not caused by drinking too much alcohol only one or two times. Some people with this condition drink alcohol to cope with or escape from negative life events. Others drink to relieve pain or symptoms of mental illness. °What increases the risk? °You are more likely to develop this condition if: °· You have a family history of alcohol use disorder. °· Your culture encourages drinking to the point of intoxication, or makes alcohol easy to get. °· You had a mood or conduct disorder in childhood. °· You have been a victim of abuse. °· You are an adolescent and: °? You have poor grades or difficulties in school. °? Your caregivers do not talk to you about saying no to alcohol, or supervise your activities. °? You are impulsive or you have trouble with self-control. °What are the signs or symptoms? °Symptoms of this condition include: °· Drinking more than you want to. °· Drinking for longer than you want to. °· Trying several times to drink less or to control your drinking. °· Spending a lot of time getting alcohol, drinking, or recovering from drinking. °· Craving alcohol. °· Having problems at work, at school, or at home due to drinking. °· Having problems in relationships due to drinking. °· Drinking when it is dangerous to drink, such as before  driving a car. °· Continuing to drink even though you know you might have a physical or mental problem related to drinking. °· Needing more and more alcohol to get the same effect you want from the alcohol (building up tolerance). °· Having symptoms of withdrawal when you stop drinking. Symptoms of withdrawal include: °? Fatigue. °? Nightmares. °? Trouble sleeping. °? Depression. °? Anxiety. °? Fever. °? Seizures. °? Severe confusion. °? Feeling or seeing things that are not there (hallucinations). °? Tremors. °? Rapid heart rate. °? Rapid breathing. °? High blood pressure. °· Drinking to avoid symptoms of withdrawal. °How is this diagnosed? °This condition is diagnosed with an assessment. Your health care provider may start the assessment by asking three or four questions about your drinking. °Your health care provider may perform a physical exam or do lab tests to see if you have physical problems resulting from alcohol use. She or he may refer you to a mental health professional for evaluation. °How is this treated? °Some people with alcohol use disorder are able to reduce their alcohol use to low-risk levels. Others need to completely quit drinking alcohol. When necessary, mental health professionals with specialized training in substance use treatment can help. Your health care provider can help you decide how severe your alcohol use disorder is and what type of treatment you need. The following forms of treatment are available: °· Detoxification. Detoxification involves quitting drinking and using prescription medicines within the first week to help lessen withdrawal symptoms. This treatment is important for people who have had withdrawal symptoms before and for heavy drinkers   who are likely to have withdrawal symptoms. Alcohol withdrawal can be dangerous, and in severe cases, it can cause death. Detoxification may be provided in a home, community, or primary care setting, or in a hospital or substance use  treatment facility. °· Counseling. This treatment is also called talk therapy. It is provided by substance use treatment counselors. A counselor can address the reasons you use alcohol and suggest ways to keep you from drinking again or to prevent problem drinking. The goals of talk therapy are to: °? Find healthy activities and ways for you to cope with stress. °? Identify and avoid the things that trigger your alcohol use. °? Help you learn how to handle cravings. °· Medicines. Medicines can help treat alcohol use disorder by: °? Decreasing alcohol cravings. °? Decreasing the positive feeling you have when you drink alcohol. °? Causing an uncomfortable physical reaction when you drink alcohol (aversion therapy). °· Support groups. Support groups are led by people who have quit drinking. They provide emotional support, advice, and guidance. °These forms of treatment are often combined. Some people with this condition benefit from a combination of treatments provided by specialized substance use treatment centers. °Follow these instructions at home: °· Take over-the-counter and prescription medicines only as told by your health care provider. °· Check with your health care provider before starting any new medicines. °· Ask friends and family members not to offer you alcohol. °· Avoid situations where alcohol is served, including gatherings where others are drinking alcohol. °· Create a plan for what to do when you are tempted to use alcohol. °· Find hobbies or activities that you enjoy that do not include alcohol. °· Keep all follow-up visits as told by your health care provider. This is important. °How is this prevented? °· If you drink, limit alcohol intake to no more than 1 drink a day for nonpregnant women and 2 drinks a day for men. One drink equals 12 oz of beer, 5 oz of wine, or 1½ oz of hard liquor. °· If you have a mental health condition, get treatment and support. °· Do not give alcohol to  adolescents. °· If you are an adolescent: °? Do not drink alcohol. °? Do not be afraid to say no if someone offers you alcohol. Speak up about why you do not want to drink. You can be a positive role model for your friends and set a good example for those around you by not drinking alcohol. °? If your friends drink, spend time with others who do not drink alcohol. Make new friends who do not use alcohol. °? Find healthy ways to manage stress and emotions, such as meditation or deep breathing, exercise, spending time in nature, listening to music, or talking with a trusted friend or family member. °Contact a health care provider if: °· You are not able to take your medicines as told. °· Your symptoms get worse. °· You return to drinking alcohol (relapse) and your symptoms get worse. °Get help right away if: °· You have thoughts about hurting yourself or others. °If you ever feel like you may hurt yourself or others, or have thoughts about taking your own life, get help right away. You can go to your nearest emergency department or call: °· Your local emergency services (911 in the [place] °· A suicide crisis helpline, such as the National Suicide Prevention Lifeline at 1-800-273-8255. This is open 24 hours a day. °Summary °· Alcohol use disorder is when your drinking disrupts your daily   life. When you have this condition, you drink too much alcohol and you cannot control your drinking. °· Treatment may include detoxification, counseling, medicine, and support groups. °· Ask friends and family members not to offer you alcohol. Avoid situations where alcohol is served. °· Get help right away if you have thoughts about hurting yourself or others. °This information is not intended to replace advice given to you by your health care provider. Make sure you discuss any questions you have with your health care provider. °Document Revised: 01/14/2017 Document Reviewed: 10/30/2015 °Elsevier Patient Education © 2020 Elsevier  Inc. ° °

## 2019-08-14 NOTE — TOC Transition Note (Signed)
Transition of Care [facility]) - CM/SW Discharge Note   Patient Details  Name: [name] [name] MRN: [id number] Date of Birth: 05-10-1960  Transition of [facility]) CM/SW Contact:  Shawn Route, RN Phone Number: 08/14/2019, 12:33 PM   Clinical Narrative:   Patient to discharge home today via [facility] related to need for oxygen and morbid obesity.  Oxygen home set up ordered, patient refuses to receive oxygen before he gets home, he requests [employer] deliver to the house this afternoon/evening.  Message given to [facility].  Discharge packet placed on chart and Debbie, RN is aware ems to be here at 130 with bariatric stretcher.      Final next level of care: Home w Home Health Services Barriers to Discharge: Barriers Resolved   Patient Goals and CMS Choice Patient states their goals for this hospitalization and ongoing recovery are:: to go home CMS Medicare.gov Compare Post Acute Care list provided to:: Patient Choice offered to / list presented to : Patient  Discharge Placement                Patient to be transferred to facility by: Jodell Cipro Name of family member notified: Patient Patient and family notified of of transfer: 08/14/19  Discharge Plan and Services In-house Referral: Clinical Social Work Discharge Planning Services: CM Consult            DME Arranged: Oxygen DME Agency: Other - Comment Electrical engineer) Date DME Agency Contacted: 08/14/19 Time DME Agency Contacted: 1230 Representative spoke with at DME Agency: Vaughan Basta HH Arranged: PT, RN, Nurse's Aide HH Agency: [facility] Date HH Agency Contacted: 08/13/19 Time HH Agency Contacted: 1043 Representative spoke with at [facility] Agency: [place]  Social Determinants of Health (SDOH) Interventions     Readmission Risk Interventions Readmission Risk Prevention Plan 01/25/2019 10/25/2018 08/08/2018  Transportation Screening Complete Complete Complete  PCP or Specialist Appt within 3-5 Days - Complete  Complete  HRI or Home Care Consult - Complete Complete  Social Work Consult for Recovery Care Planning/Counseling - Complete Patient refused  Palliative Care Screening - Not Applicable Not Applicable  Medication Review Oceanographer) Complete Complete Complete  PCP or Specialist appointment within 3-5 days of discharge Complete - -  HRI or Home Care Consult Complete - -  SW Recovery Care/Counseling Consult Complete - -  Palliative Care Screening Not Applicable - -  Skilled Nursing Facility Not Applicable - -

## 2019-08-14 NOTE — Telephone Encounter (Signed)
Patient has not set up with a PCP from my knowledge, I have tried contact [name] twice last week to address referral previously requestd for home health but have been unable to get a hold of patient.KW

## 2019-08-14 NOTE — Telephone Encounter (Signed)
Patient was recently discharged from the hospital on 08/14/19.  No TCM completed, patient does not qualify for TCM services due to [facility] insurance coverage.  Per discharge summary patient needs a follow up with PCP. HFU scheduled 08/16/19 @ 2:40 PM. Lorain Childes!

## 2019-08-14 NOTE — Consult Note (Addendum)
  WOC Nurse re-consult note:  Consult requested to apply bilat Una boots; previously applied compression wraps had been removed at some point.  Pt states he was wearing compression wraps prior to admission and they were changed several times a week. Refer to previous WOC consult note on 6/25; pt has a full thickness wound in patchy areas to left outer leg; approx 8X4.5X.4cm, mod amt yellow drainage, no odor, yellow wound bed.  No other open wounds or drainage, bilat legs with generalized edema; patchy dry scabbed areas to right leg.  Applied Aqacel to left leg to absorb drainage and provide antimicrobial benefits.  Applied [employer] and Coban to bilat legs.  Pt will need home health follow-up to change compression wraps twice a week after discharge.  WOC will change dressings as follows while in the hospital: Aquacel to left leg, and bilat Una boots Q Tues and Fri Cammie Mcgee MSN, RN, [place], [place], CNS [phone]

## 2019-08-14 NOTE — Progress Notes (Signed)
Attempted to re wrap pt's legs. Pt refused.

## 2019-08-15 ENCOUNTER — Telehealth (HOSPITAL_COMMUNITY): Payer: Self-pay

## 2019-08-15 ENCOUNTER — Other Ambulatory Visit: Payer: Self-pay | Admitting: Adult Health

## 2019-08-15 ENCOUNTER — Ambulatory Visit: Payer: Self-pay | Admitting: *Deleted

## 2019-08-15 ENCOUNTER — Telehealth: Payer: Self-pay

## 2019-08-15 ENCOUNTER — Other Ambulatory Visit: Payer: Self-pay | Admitting: Family Medicine

## 2019-08-15 DIAGNOSIS — J449 Chronic obstructive pulmonary disease, unspecified: Secondary | ICD-10-CM

## 2019-08-15 DIAGNOSIS — G510 Bell's palsy: Secondary | ICD-10-CM

## 2019-08-15 DIAGNOSIS — M1611 Unilateral primary osteoarthritis, right hip: Secondary | ICD-10-CM

## 2019-08-15 DIAGNOSIS — G894 Chronic pain syndrome: Secondary | ICD-10-CM

## 2019-08-15 DIAGNOSIS — I5042 Chronic combined systolic (congestive) and diastolic (congestive) heart failure: Secondary | ICD-10-CM

## 2019-08-15 DIAGNOSIS — S069X9S Unspecified intracranial injury with loss of consciousness of unspecified duration, sequela: Secondary | ICD-10-CM

## 2019-08-15 DIAGNOSIS — I10 Essential (primary) hypertension: Secondary | ICD-10-CM

## 2019-08-15 DIAGNOSIS — M10041 Idiopathic gout, right hand: Secondary | ICD-10-CM

## 2019-08-15 DIAGNOSIS — L8990 Pressure ulcer of unspecified site, unspecified stage: Secondary | ICD-10-CM

## 2019-08-15 NOTE — Telephone Encounter (Signed)
Please review request below. KW 

## 2019-08-15 NOTE — Chronic Care Management (AMB) (Addendum)
Chronic Care Management   Social Work Note  08/15/2019 Name: Jed Kutch MRN: [id number] DOB: 12-24-1960  [name] [name] is a 59 y.o. year old male who sees Flinchum, [name], FNP for primary care. The CCM team was consulted for assistance with Level of Care Concerns.   Phone call to APS worker Riley Lam [phone] to follow up on concerns related to patient's care needs. Voicemail message left requesting a return call.  Phone call to [place] at [facility] who confirmed that patient has not been dismissed from [facility] and that they will continue to provide [facility] services.  SDOH (Social Determinants of Health) assessments performed: No     Outpatient Encounter Medications as of 08/15/2019  Medication Sig  . albuterol (VENTOLIN HFA) 108 (90 Base) MCG/ACT inhaler Inhale 2 puffs into the lungs every 6 (six) hours as needed for wheezing or shortness of breath.  . allopurinol (ZYLOPRIM) 100 MG tablet TAKE 1 TABLET BY MOUTH ONCE DAILY  . atorvastatin (LIPITOR) 40 MG tablet TAKE 1 TABLET BY MOUTH ONCE DAILY  . colchicine 0.6 MG tablet Take 1 tablet (0.6 mg total) by mouth every other day.  . DULoxetine (CYMBALTA) 20 MG capsule Take 1 capsule (20 mg total) by mouth daily.  . ferrous sulfate 325 (65 FE) MG tablet Take 1 tablet (325 mg total) by mouth 2 (two) times daily with a meal.  . fluticasone (FLONASE) 50 MCG/ACT nasal spray Place 2 sprays into both nostrils 2 (two) times daily.  [name] gabapentin (NEURONTIN) 300 MG capsule TAKE 1 CAPSULE BY MOUTH 3 TIMES DAILY  . levothyroxine (SYNTHROID) 50 MCG tablet Take 1 tablet (50 mcg total) by mouth daily before breakfast. Need lab work beginning of June for Thyroid  . lisinopril (ZESTRIL) 2.5 MG tablet Take 1 tablet (2.5 mg total) by mouth daily.  [name] loperamide (IMODIUM) 2 MG capsule Take 1 capsule (2 mg total) by mouth as needed for diarrhea or loose stools.  . metoprolol succinate (TOPROL-XL) 50 MG 24 hr tablet Take 1 tablet (50 mg total) by  mouth daily. Take with or immediately following a meal.  . naphazoline-glycerin (CLEAR EYES REDNESS) 0.012-0.2 % SOLN Place 1-2 drops into both eyes 4 (four) times daily as needed for eye irritation.  . nitroGLYCERIN (NITROSTAT) 0.4 MG SL tablet Place 1 tablet under tongue every 5 minutes as needed for chest pain. (No more than 3 doses within 15 minutes)  . oxyCODONE (OXY IR/ROXICODONE) 5 MG immediate release tablet Take 1 tablet (5 mg total) by mouth every 6 (six) hours as needed for moderate pain or severe pain.  . predniSONE (DELTASONE) 10 MG tablet 3 tabs po daily for three days then 2 tabs po daily for three days, then 1 tab po daily for five days  . sulfamethoxazole-trimethoprim (BACTRIM DS) 800-160 MG tablet Take 1 tablet by mouth every 12 (twelve) hours.  . thiamine 100 MG tablet Take 1 tablet (100 mg total) by mouth daily.  [name] torsemide (DEMADEX) 20 MG tablet Take 1 tablet (20 mg total) by mouth 2 (two) times daily.  . traZODone (DESYREL) 150 MG tablet TAKE 1 TABLET BY MOUTH AT BEDTIME AS NEEDED SLEEP  . warfarin (COUMADIN) 4 MG tablet Take 1 tablet (4 mg total) by mouth daily at 4 PM.   No facility-administered encounter medications on file as of 08/15/2019.    Goals Addressed   None     Follow Up Plan: SW will follow up with patient by phone over the next 5-7 business days  Verna Czech, LCSW Clinical Social Worker  [facility] Care Management [phone]

## 2019-08-15 NOTE — Telephone Encounter (Signed)
Copied from CRM [id number]. Topic: General - Other >> Aug 15, 2019 12:12 PM Dalphine Handing A wrote: Pharmacy called and is requesting meningard medication be sent over as alternative due to patients insurance. Please advise [facility] - [place], [place] - [address]  Phone:  [phone] Fax:  [phone]

## 2019-08-15 NOTE — Telephone Encounter (Signed)
Copied from CRM [id number]. Topic: General - Other >> Aug 15, 2019 11:13 AM Lyn Hollingshead D wrote: Reason for CRM: Hubert Azure, Social Worker [phone] / Will like to speak with the medical staff

## 2019-08-15 NOTE — Telephone Encounter (Signed)
Spoke with [name] who stats that she is calling from adult social services. She stas that patient had reported to her that we have not taking steps to help with home aide and she is wanting a follow up discussion to address need for services. KW

## 2019-08-15 NOTE — Telephone Encounter (Signed)
Contacted Sayyid to see if he has everything since discharged from hospital yesterday.  He states has no medciations, he states does not know what happened to them, they are not at his home.  He has not took anything since discharge yesterday.   He states [facility] is suppose to be delivering tomorrow.  Contacted [facility] and they states they have 4 medications to be delivered, asked about everything else.  He states can go ahead and get his pill packs ready and the additional 4 prescriptions.  He needs refill on allopurinol, atorvastatin, cholchicine and ferrous sulfate, he states will reach out to doctor for those refills.  He advised me he has tried to contact [facility] and no answer, he states will get these ready and deliver today.  Avir states oxygen was delivered and using it.  He states he is still tired and has not caught up on his sleep, he states going to bed for me to call him later.  He states not heard from [facility] yet and he still is wanting an aid.  He states his PCP is refusing to see him anymore due to missed visits.  He is trying to get in at [facility], will try to assist him on that.  Will call him later and advise him he needs to answer his phone when [facility] calls.  [facility] will deliver his meds.  Will visit for heart failure, diet and medication management.   [name] Stoy EMT-Paramedic [phone]

## 2019-08-15 NOTE — Progress Notes (Signed)
Orders Placed This Encounter  Procedures  . Ambulatory referral to Home Health    Referral Priority:   Urgent    Referral Type:   Home Health Care    Referral Reason:   Specialty Services Required    Requested Specialty:   Home Health Services    Number of Visits Requested:   1

## 2019-08-15 NOTE — Telephone Encounter (Signed)
Requested medication (s) are due for refill today: no  Requested medication (s) are on the active medication list: yes  Future visit scheduled: yes  Notes to clinic:  patient has appointment tomorrow Medication not was prescribe by different provider    Requested Prescriptions  Pending Prescriptions Disp Refills   atorvastatin (LIPITOR) 40 MG tablet [Pharmacy Med Name: ATORVASTATIN CALCIUM 40 MG TAB] 30 tablet     Sig: TAKE 1 TABLET BY MOUTH ONCE DAILY      Cardiovascular:  Antilipid - Statins Passed - 08/15/2019  8:46 AM      Passed - Total Cholesterol in normal range and within 360 days    Cholesterol, Total  Date Value Ref Range Status  05/02/2019 166 100 - 199 mg/dL Final   Cholesterol  Date Value Ref Range Status  06/23/2019 163 0 - 200 mg/dL Final          Passed - LDL in normal range and within 360 days    LDL Chol Calc (NIH)  Date Value Ref Range Status  05/02/2019 75 0 - 99 mg/dL Final   LDL Cholesterol  Date Value Ref Range Status  06/23/2019 65 0 - 99 mg/dL Final    Comment:           Total Cholesterol/HDL:CHD Risk Coronary Heart Disease Risk Table                     Men   Women  1/2 Average Risk   3.4   3.3  Average Risk       5.0   4.4  2 X Average Risk   9.6   7.1  3 X Average Risk  23.4   11.0        Use the calculated Patient Ratio above and the CHD Risk Table to determine the patient's CHD Risk.        ATP III CLASSIFICATION (LDL):  <100     mg/dL   Optimal  485-462  mg/dL   Near or Above                    Optimal  130-159  mg/dL   Borderline  703-500  mg/dL   High  >938     mg/dL   Very High Performed at [facility], [address] [place], [place] [address]           Passed - HDL in normal range and within 360 days    HDL  Date Value Ref Range Status  06/23/2019 79 >40 mg/dL Final  37/16/9678 70 >93 mg/dL Final          Passed - Triglycerides in normal range and within 360 days    Triglycerides  Date Value Ref  Range Status  06/23/2019 95 <150 mg/dL Final          Passed - Patient is not pregnant      Passed - Valid encounter within last 12 months    Recent Outpatient Visits           2 months ago Acute idiopathic gout of right hand   [facility] [place], [place], [place]   3 months ago Left-sided weakness   [facility] Flinchum, [name], FNP   4 months ago Morbid obesity [facility])   [facility] Flinchum, [name], FNP   5 months ago No-show for appointment   [facility] Flinchum, [name], FNP   5  months ago Right hip pain   [facility] Flinchum, [name], FNP       Future Appointments             Tomorrow Flinchum, [name], FNP [facility]

## 2019-08-15 NOTE — Telephone Encounter (Signed)
[facility] was in the home, and patinet has not kept any follow up appointments with [place].  He has an appointment on 08/16/2019. Spoke with Child psychotherapist from adult DSS [employer] adult protective services and she wants to speak with our Child psychotherapist who has been helping with Mr. Hippler. Can place another order for home health if it will help and if no one is in the home.  Palliative Care consult may be helpful.

## 2019-08-15 NOTE — Telephone Encounter (Signed)
Please clarify medication request with pharmacy I am unclear what they are referring to or they can fax requests for needed prescriptions. Patient has office visit on 08/16/19.

## 2019-08-15 NOTE — Telephone Encounter (Signed)
[name] [name] I spoke with adult protective services of [place] [facility] Social worker- her number is [phone].  [name] [name] is saying that he is no longer receiving home health and needs an aide ? I know this was all set up previously  ? [facility] may have discharged him for non compliance it seems. Para medicine services are still coming. Can you follow up with DSS and help with additional resources - as suggested to him before Doctors making house calls would be good considering he is unable to come to appointment.  Can we have pharmacist and Nurse case manager consult again to ? Happy to help with any needs. He does have an office visit 08/16/2019 if he comes to appointment.

## 2019-08-15 NOTE — Progress Notes (Deleted)
Established patient visit   Patient: [name] [name]   DOB: 1960/03/17   59 y.o. Male  MRN: [id number] Visit Date: 08/16/2019  Today's healthcare provider: Jairo Ben, FNP   No chief complaint on file.  Subjective    HPI  Follow up Hospitalization  Patient was admitted to [facility] on 08/09/2019 and discharged on 08/14/2019. He was treated for Sepsis, Wound, Cellulitis . Treatment for this included IV antibiotics. Telephone follow up was not done due to patient's insurance. He reports {excellent/good/fair:19992} compliance with treatment. He reports this condition is {resolved/improved/worsened:23923}.  ----------------------------------------------------------------------------------------- -    {Show patient history (optional):23778::" "}   Medications: Outpatient Medications Prior to Visit  Medication Sig  . albuterol (VENTOLIN HFA) 108 (90 Base) MCG/ACT inhaler Inhale 2 puffs into the lungs every 6 (six) hours as needed for wheezing or shortness of breath.  . allopurinol (ZYLOPRIM) 100 MG tablet TAKE 1 TABLET BY MOUTH ONCE DAILY  . atorvastatin (LIPITOR) 40 MG tablet TAKE 1 TABLET BY MOUTH ONCE DAILY  . colchicine 0.6 MG tablet Take 1 tablet (0.6 mg total) by mouth every other day.  . DULoxetine (CYMBALTA) 20 MG capsule Take 1 capsule (20 mg total) by mouth daily.  . ferrous sulfate 325 (65 FE) MG tablet Take 1 tablet (325 mg total) by mouth 2 (two) times daily with a meal.  . fluticasone (FLONASE) 50 MCG/ACT nasal spray Place 2 sprays into both nostrils 2 (two) times daily.  [name] gabapentin (NEURONTIN) 300 MG capsule TAKE 1 CAPSULE BY MOUTH 3 TIMES DAILY  . levothyroxine (SYNTHROID) 50 MCG tablet Take 1 tablet (50 mcg total) by mouth daily before breakfast. Need lab work beginning of June for Thyroid  . lisinopril (ZESTRIL) 2.5 MG tablet Take 1 tablet (2.5 mg total) by mouth daily.  [name] loperamide (IMODIUM) 2 MG capsule Take 1 capsule (2 mg total) by mouth as  needed for diarrhea or loose stools.  . metoprolol succinate (TOPROL-XL) 50 MG 24 hr tablet Take 1 tablet (50 mg total) by mouth daily. Take with or immediately following a meal.  . naphazoline-glycerin (CLEAR EYES REDNESS) 0.012-0.2 % SOLN Place 1-2 drops into both eyes 4 (four) times daily as needed for eye irritation.  . nitroGLYCERIN (NITROSTAT) 0.4 MG SL tablet Place 1 tablet under tongue every 5 minutes as needed for chest pain. (No more than 3 doses within 15 minutes)  . oxyCODONE (OXY IR/ROXICODONE) 5 MG immediate release tablet Take 1 tablet (5 mg total) by mouth every 6 (six) hours as needed for moderate pain or severe pain.  . predniSONE (DELTASONE) 10 MG tablet 3 tabs po daily for three days then 2 tabs po daily for three days, then 1 tab po daily for five days  . sulfamethoxazole-trimethoprim (BACTRIM DS) 800-160 MG tablet Take 1 tablet by mouth every 12 (twelve) hours.  . thiamine 100 MG tablet Take 1 tablet (100 mg total) by mouth daily.  [name] torsemide (DEMADEX) 20 MG tablet Take 1 tablet (20 mg total) by mouth 2 (two) times daily.  . traZODone (DESYREL) 150 MG tablet TAKE 1 TABLET BY MOUTH AT BEDTIME AS NEEDED SLEEP  . warfarin (COUMADIN) 4 MG tablet Take 1 tablet (4 mg total) by mouth daily at 4 PM.   No facility-administered medications prior to visit.    Review of Systems  Constitutional: Negative.   Cardiovascular: Positive for leg swelling.  Musculoskeletal: Negative.   Skin: Positive for wound.    {Heme  Chem  Endocrine  Serology  Results Review (optional):23779::" "}  Objective    There were no vitals taken for this visit. {Show previous vital signs (optional):23777::" "}  Physical Exam  ***  No results found for any visits on 08/16/19.  Assessment & Plan     ***  No follow-ups on file.      {provider attestation***:1}   Jairo Ben, FNP  [facility] [phone] (phone) [phone] (fax)  [facility]

## 2019-08-15 NOTE — Telephone Encounter (Signed)
Contacted Kysen to see if he would be going to his appts.  He said he is too weak and tired to go.  He states he can not drive and does not have any portable oxygen tanks to go.  Advised him I could schedule EMS convalescent unit to take him and he continue to refuse to go to his appts.  He states ambulance is too uncomfortable.  He states he just wants an aid.  Advised him everyone is trying to help him but he has to go to his appts. Advised him he needs to call his appts and explain to them why he is not coming to them.  He states [facility] has not delivered his meds yet, he was going to call them.  He states he has been sleeping all day.  He has not had any meds since at the hospital yesterday per him.  Will contact him tomorrow.   [name] [place] EMT-Paramedic [phone]

## 2019-08-15 NOTE — Telephone Encounter (Signed)
Pharmacist states that colcries is no longer covered by insurance and that they will only cover miniguard. KW

## 2019-08-15 NOTE — Telephone Encounter (Signed)
Patient has a follow up from hospital scheduled 08/16/19 with office,.

## 2019-08-15 NOTE — Progress Notes (Signed)
From: Wenda [place], [place] Sent: 08/15/2019   4:54 PM EDT To: Berniece Pap, FNP  Hello Marcelino Duster,  I have called [name] -APS  worker and left a message. I have also spoken with [facility], they have not dismissed him and will take him back. Apparently, they did not know he discharged from the hospital. I am not sure what happened with the personal care services. I will have to follow up with that tomorrow. Now that we have gone to Managed Care Medicaid the process is different. I will let you know as soon as I find out something.    Verna Czech, LCSW Clinical Social Worker  [facility] Care Management [phone]

## 2019-08-16 ENCOUNTER — Ambulatory Visit: Payer: Self-pay | Admitting: *Deleted

## 2019-08-16 ENCOUNTER — Telehealth (HOSPITAL_COMMUNITY): Payer: Self-pay

## 2019-08-16 ENCOUNTER — Ambulatory Visit: Payer: Medicaid Other | Admitting: Adult Health

## 2019-08-16 ENCOUNTER — Telehealth: Payer: Self-pay

## 2019-08-16 ENCOUNTER — Other Ambulatory Visit (INDEPENDENT_AMBULATORY_CARE_PROVIDER_SITE_OTHER): Payer: Medicaid Other | Admitting: Adult Health

## 2019-08-16 NOTE — Telephone Encounter (Signed)
Patinet has an appointment this afternoon at 2:40 pm be sure sister is on DPR. Patient should be advised to keep his appointment this afternoon. He is also a patient at the wound clinic for his legs and that may be more appropriate if this is related. He has [employer] coming out. Patient has not come in the office in a long while and he needs to be evaluated here and at the wound center in person.   Let me know if further clarification of MRSA or questions ?

## 2019-08-16 NOTE — Progress Notes (Signed)
Provider attempted to reach patient by phone to follow up  No answer.

## 2019-08-16 NOTE — Telephone Encounter (Signed)
Copied from CRM [id number]. Topic: General - Other >> Aug 16, 2019  9:26 AM Wyonia Hough E wrote: Reason for CRM: Pt has [name] [name] in his legs/Pts sister called and wanted to speak with provider about a procedure for [place]/ please advise

## 2019-08-16 NOTE — Telephone Encounter (Signed)
Patient has an appointment at the wound center tomorrow. If he can not get to his appointment today, and has worsening pain, any fever, chillls, fatigue or worsening pain I would advise him to call 911 and go back to the hospital immediately based on the photos of his legs while in the hospital. Patient needs to come in the PCP office for further evaluation, many missed appointments, Adult DSS is involved, refused visits with [facility] and other caregivers with [facility]. Missed appointments with wound center. Need to see patient  to multiple safety concerns and no follow up appointments kept recently.  Crystal Child psychotherapist has been working with [name] [name] as well, if he will agree as we have suggested before skilled nursing home would be good or possibly doctors making house calls . He has declined all this in past.

## 2019-08-16 NOTE — Telephone Encounter (Signed)
Patient's sister [name] is not on DPR. Actually could not find DPR in chart. Advised sister she would need to be on DPR to discuss patient's medical history. [name] did say patient has a appointment today that he will not be attending due to transportation and patient will reschedule. She is requesting patient get a referral for wound debridement.

## 2019-08-16 NOTE — Telephone Encounter (Signed)
Attempted to contact patient, no answer left a voicemail. Okay for PEC triage to advise patient. ° °

## 2019-08-16 NOTE — Chronic Care Management (AMB) (Signed)
    Care Management   Unsuccessful Call Note 08/16/2019 Name: [name] [name] MRN: [id number] DOB: 07-01-1960  Patient  is a 59 year old male who sees Marvell Fuller, FNP for primary care. Marvell Fuller, FNP asked the CCM team to consult the patient for community resources.   This social worker was unable to reach patient via telephone today for information regarding his managed care plan currently enrolled in. I have left HIPAA compliant voicemail asking patient to return my call. (unsuccessful outreach #1).   Plan: Will follow-up within 7 business days via telephone.      Verna Czech, LCSW Clinical Social Worker  [facility] Care Management [phone]

## 2019-08-16 NOTE — Chronic Care Management (AMB) (Cosign Needed)
Chronic Care Management   Social Work Note  08/16/2019 Name: [name] [name] MRN: [id number] DOB: February 27, 1960  [name] [name] is a 59 y.o. year old male who sees Flinchum, [name], FNP for primary care. The CCM team was consulted for assistance with [name] .   Phone call to [employer] to check status of patient's in home aid. Due to the transition now to managed care medicaid, the request for personal care services will have to go through patient's managed care provider.  SDOH (Social Determinants of Health) assessments performed: No     Outpatient Encounter Medications as of 08/16/2019  Medication Sig  . albuterol (VENTOLIN HFA) 108 (90 Base) MCG/ACT inhaler Inhale 2 puffs into the lungs every 6 (six) hours as needed for wheezing or shortness of breath.  . allopurinol (ZYLOPRIM) 100 MG tablet TAKE 1 TABLET BY MOUTH ONCE DAILY  . atorvastatin (LIPITOR) 40 MG tablet TAKE 1 TABLET BY MOUTH ONCE DAILY  . colchicine 0.6 MG tablet Take 1 tablet (0.6 mg total) by mouth every other day.  . DULoxetine (CYMBALTA) 20 MG capsule Take 1 capsule (20 mg total) by mouth daily.  . ferrous sulfate 325 (65 FE) MG tablet Take 1 tablet (325 mg total) by mouth 2 (two) times daily with a meal.  . fluticasone (FLONASE) 50 MCG/ACT nasal spray Place 2 sprays into both nostrils 2 (two) times daily.  [name] gabapentin (NEURONTIN) 300 MG capsule TAKE 1 CAPSULE BY MOUTH 3 TIMES DAILY  . levothyroxine (SYNTHROID) 50 MCG tablet Take 1 tablet (50 mcg total) by mouth daily before breakfast. Need lab work beginning of June for Thyroid  . lisinopril (ZESTRIL) 2.5 MG tablet Take 1 tablet (2.5 mg total) by mouth daily.  [name] loperamide (IMODIUM) 2 MG capsule Take 1 capsule (2 mg total) by mouth as needed for diarrhea or loose stools.  . metoprolol succinate (TOPROL-XL) 50 MG 24 hr tablet Take 1 tablet (50 mg total) by mouth daily. Take with or immediately following a meal.  . naphazoline-glycerin (CLEAR EYES  REDNESS) 0.012-0.2 % SOLN Place 1-2 drops into both eyes 4 (four) times daily as needed for eye irritation.  . nitroGLYCERIN (NITROSTAT) 0.4 MG SL tablet Place 1 tablet under tongue every 5 minutes as needed for chest pain. (No more than 3 doses within 15 minutes)  . oxyCODONE (OXY IR/ROXICODONE) 5 MG immediate release tablet Take 1 tablet (5 mg total) by mouth every 6 (six) hours as needed for moderate pain or severe pain.  . predniSONE (DELTASONE) 10 MG tablet 3 tabs po daily for three days then 2 tabs po daily for three days, then 1 tab po daily for five days  . sulfamethoxazole-trimethoprim (BACTRIM DS) 800-160 MG tablet Take 1 tablet by mouth every 12 (twelve) hours.  . thiamine 100 MG tablet Take 1 tablet (100 mg total) by mouth daily.  [name] torsemide (DEMADEX) 20 MG tablet Take 1 tablet (20 mg total) by mouth 2 (two) times daily.  . traZODone (DESYREL) 150 MG tablet TAKE 1 TABLET BY MOUTH AT BEDTIME AS NEEDED SLEEP  . warfarin (COUMADIN) 4 MG tablet Take 1 tablet (4 mg total) by mouth daily at 4 PM.   No facility-administered encounter medications on file as of 08/16/2019.    Goals Addressed   None     Follow Up Plan: SW will follow up with patient by phone over the next 5-7 business days to identify patient's managed care provider   [place], LCSW Clinical Social Worker  [place] Management [phone]

## 2019-08-16 NOTE — Telephone Encounter (Signed)
Contacted [place] and he states still feels bad and can not get caught up with his sleep.  He is still refusing to go to any appts this week, even after offering him an ambulance ride on stretcher to his appts.  He has not received his medications yet, contacted [facility], they attempted to deliver but he would not answer, they will deliver today by lunch.  He has not took any meds since discharge he says. Discussed importance of making his appts, he mentioned doctors making house calls, sent message to Flinchum to advise her he was interesting in that.   Will contact later to make sure he got his meds.   [name] [place] EMT-Paramedic [phone]

## 2019-08-16 NOTE — Telephone Encounter (Signed)
Provider and Social worker has attempted to reach patient today again by phone and no answer. Social worker [employer] is reaching back out to [employer].

## 2019-08-16 NOTE — Chronic Care Management (AMB) (Signed)
.  Chronic Care Management   Social Work Note  08/16/2019 Name: [name] [name] MRN: [id number] DOB: Nov 14, 1960  [name] [name] is a 59 y.o. year old male who sees Flinchum, [name], FNP for primary care. The CCM team was consulted for assistance with [name]  and Level of Care Concerns.   Phone call to [name], Child psychotherapist for the Department of Social Services [phone] to discuss concerns regarding patient's ability to provide self care. Voicemail message left requesting a return call.  SDOH (Social Determinants of Health) assessments performed: No     Outpatient Encounter Medications as of 08/16/2019  Medication Sig  . albuterol (VENTOLIN HFA) 108 (90 Base) MCG/ACT inhaler Inhale 2 puffs into the lungs every 6 (six) hours as needed for wheezing or shortness of breath.  . allopurinol (ZYLOPRIM) 100 MG tablet TAKE 1 TABLET BY MOUTH ONCE DAILY  . atorvastatin (LIPITOR) 40 MG tablet TAKE 1 TABLET BY MOUTH ONCE DAILY  . colchicine 0.6 MG tablet Take 1 tablet (0.6 mg total) by mouth every other day.  . DULoxetine (CYMBALTA) 20 MG capsule Take 1 capsule (20 mg total) by mouth daily.  . ferrous sulfate 325 (65 FE) MG tablet Take 1 tablet (325 mg total) by mouth 2 (two) times daily with a meal.  . fluticasone (FLONASE) 50 MCG/ACT nasal spray Place 2 sprays into both nostrils 2 (two) times daily.  [name] gabapentin (NEURONTIN) 300 MG capsule TAKE 1 CAPSULE BY MOUTH 3 TIMES DAILY  . levothyroxine (SYNTHROID) 50 MCG tablet Take 1 tablet (50 mcg total) by mouth daily before breakfast. Need lab work beginning of June for Thyroid  . lisinopril (ZESTRIL) 2.5 MG tablet Take 1 tablet (2.5 mg total) by mouth daily.  [name] loperamide (IMODIUM) 2 MG capsule Take 1 capsule (2 mg total) by mouth as needed for diarrhea or loose stools.  . metoprolol succinate (TOPROL-XL) 50 MG 24 hr tablet Take 1 tablet (50 mg total) by mouth daily. Take with or immediately following a meal.  . naphazoline-glycerin  (CLEAR EYES REDNESS) 0.012-0.2 % SOLN Place 1-2 drops into both eyes 4 (four) times daily as needed for eye irritation.  . nitroGLYCERIN (NITROSTAT) 0.4 MG SL tablet Place 1 tablet under tongue every 5 minutes as needed for chest pain. (No more than 3 doses within 15 minutes)  . oxyCODONE (OXY IR/ROXICODONE) 5 MG immediate release tablet Take 1 tablet (5 mg total) by mouth every 6 (six) hours as needed for moderate pain or severe pain.  . predniSONE (DELTASONE) 10 MG tablet 3 tabs po daily for three days then 2 tabs po daily for three days, then 1 tab po daily for five days  . sulfamethoxazole-trimethoprim (BACTRIM DS) 800-160 MG tablet Take 1 tablet by mouth every 12 (twelve) hours.  . thiamine 100 MG tablet Take 1 tablet (100 mg total) by mouth daily.  [name] torsemide (DEMADEX) 20 MG tablet Take 1 tablet (20 mg total) by mouth 2 (two) times daily.  . traZODone (DESYREL) 150 MG tablet TAKE 1 TABLET BY MOUTH AT BEDTIME AS NEEDED SLEEP  . warfarin (COUMADIN) 4 MG tablet Take 1 tablet (4 mg total) by mouth daily at 4 PM.   No facility-administered encounter medications on file as of 08/16/2019.    Goals Addressed   None     Follow Up Plan: SW will follow up with patient by phone over the next 5-7 business days   Cindee [place]clester, [place] Clinical Social Worker  [facility] Care Management [phone]

## 2019-08-17 ENCOUNTER — Encounter: Payer: Medicaid Other | Attending: Physician Assistant | Admitting: Physician Assistant

## 2019-08-17 NOTE — Telephone Encounter (Signed)
Provider still unable to contact patient by phone 08/17/2019.

## 2019-08-21 ENCOUNTER — Ambulatory Visit: Payer: Self-pay | Admitting: *Deleted

## 2019-08-21 NOTE — Telephone Encounter (Signed)
Order should have been placed and should have been marked urgent  - if you do not mind verifying I know [name] [name] in referrals was working on this. Unable to reach patient by phone.

## 2019-08-21 NOTE — Telephone Encounter (Signed)
Does any further action need to be done with this patient at this time? KW

## 2019-08-21 NOTE — Telephone Encounter (Signed)
Just clarifying does home order need to be marked urgent? KW

## 2019-08-21 NOTE — Chronic Care Management (AMB) (Signed)
Chronic Care Management   Social Work Note  08/21/2019 Name: [name] [name] MRN: [id number] DOB: 1960/04/12  [name] [name] is a 59 y.o. year old male who sees Flinchum, [name], FNP for primary care. The CCM team was consulted for assistance with [name] .   Phone call to the Department of Social Services-spoke with [name] [phone]. Discussed concern regarding patient's missed follow up appointments with wound care and primary provider's office despite offer for transportation assistance.  Confirmed that HH has been re-ordered by primary care doctor.  Discussed difficulty in maintaining contact with patient as well. Brooke discussed patient's request for personal care services to assist with errands, house cleaning and baths. Brooke did report clarifing with patient that personal care services paid by Medicaid only assist with bathing, dressing and feeding. Housekeeping and errands would be an out of pocket expense.Out of home placement has been discussed with patient and  has declined.  DSS is involved now on a temporary voluntary basis. The maximum involvement-90 days. Her plan is to assist patient with needed services in the home. In addition she will reinforce the need to follow through with services once they are in place.    SDOH (Social Determinants of Health) assessments performed: No     Outpatient Encounter Medications as of 08/21/2019  Medication Sig  . albuterol (VENTOLIN HFA) 108 (90 Base) MCG/ACT inhaler Inhale 2 puffs into the lungs every 6 (six) hours as needed for wheezing or shortness of breath.  . allopurinol (ZYLOPRIM) 100 MG tablet TAKE 1 TABLET BY MOUTH ONCE DAILY  . atorvastatin (LIPITOR) 40 MG tablet TAKE 1 TABLET BY MOUTH ONCE DAILY  . colchicine 0.6 MG tablet Take 1 tablet (0.6 mg total) by mouth every other day.  . DULoxetine (CYMBALTA) 20 MG capsule Take 1 capsule (20 mg total) by mouth daily.  . ferrous sulfate 325 (65 FE) MG tablet Take 1 tablet  (325 mg total) by mouth 2 (two) times daily with a meal.  . fluticasone (FLONASE) 50 MCG/ACT nasal spray Place 2 sprays into both nostrils 2 (two) times daily.  [name] gabapentin (NEURONTIN) 300 MG capsule TAKE 1 CAPSULE BY MOUTH 3 TIMES DAILY  . levothyroxine (SYNTHROID) 50 MCG tablet Take 1 tablet (50 mcg total) by mouth daily before breakfast. Need lab work beginning of June for Thyroid  . lisinopril (ZESTRIL) 2.5 MG tablet Take 1 tablet (2.5 mg total) by mouth daily.  [name] loperamide (IMODIUM) 2 MG capsule Take 1 capsule (2 mg total) by mouth as needed for diarrhea or loose stools.  . metoprolol succinate (TOPROL-XL) 50 MG 24 hr tablet Take 1 tablet (50 mg total) by mouth daily. Take with or immediately following a meal.  . naphazoline-glycerin (CLEAR EYES REDNESS) 0.012-0.2 % SOLN Place 1-2 drops into both eyes 4 (four) times daily as needed for eye irritation.  . nitroGLYCERIN (NITROSTAT) 0.4 MG SL tablet Place 1 tablet under tongue every 5 minutes as needed for chest pain. (No more than 3 doses within 15 minutes)  . oxyCODONE (OXY IR/ROXICODONE) 5 MG immediate release tablet Take 1 tablet (5 mg total) by mouth every 6 (six) hours as needed for moderate pain or severe pain.  . predniSONE (DELTASONE) 10 MG tablet 3 tabs po daily for three days then 2 tabs po daily for three days, then 1 tab po daily for five days  . sulfamethoxazole-trimethoprim (BACTRIM DS) 800-160 MG tablet Take 1 tablet by mouth every 12 (twelve) hours.  . thiamine 100 MG tablet Take  1 tablet (100 mg total) by mouth daily.  [name] torsemide (DEMADEX) 20 MG tablet Take 1 tablet (20 mg total) by mouth 2 (two) times daily.  . traZODone (DESYREL) 150 MG tablet TAKE 1 TABLET BY MOUTH AT BEDTIME AS NEEDED SLEEP  . warfarin (COUMADIN) 4 MG tablet Take 1 tablet (4 mg total) by mouth daily at 4 PM.   No facility-administered encounter medications on file as of 08/21/2019.    Goals Addressed   None     Follow Up Plan: This Child psychotherapist to  work on identifying patient's medicaid plan to request personal care assistance on his behalf   [place], [place] Clinical Social Worker  [facility] Care Management [phone]

## 2019-08-21 NOTE — Telephone Encounter (Signed)
No Chrystal LCSW was reaching back out to DSS, have been unable to reach patient by phone and he continues not to come in to office or return calls to office.

## 2019-08-22 LAB — BLOOD GAS, VENOUS
Acid-Base Excess: 6.2 mmol/L — ABNORMAL HIGH (ref 0.0–2.0)
Bicarbonate: 33 mmol/L — ABNORMAL HIGH (ref 20.0–28.0)
O2 Saturation: 57.3 %
Patient temperature: 37
pCO2, Ven: 57 mmHg (ref 44.0–60.0)
pH, Ven: 7.37 (ref 7.250–7.430)

## 2019-08-22 NOTE — Progress Notes (Signed)
Old lab result from hospital. Unable to reach patient by phone and he has refused to come to the office for follow up.

## 2019-08-23 NOTE — Telephone Encounter (Signed)
Your correct it was placed on 08/15/19. KW

## 2019-08-23 NOTE — Telephone Encounter (Signed)
Noted  

## 2019-08-24 ENCOUNTER — Ambulatory Visit: Payer: Medicaid Other | Admitting: Physician Assistant

## 2019-08-24 ENCOUNTER — Other Ambulatory Visit: Payer: Self-pay

## 2019-08-24 ENCOUNTER — Ambulatory Visit: Payer: Medicaid Other | Attending: Family | Admitting: Family

## 2019-08-24 ENCOUNTER — Encounter: Payer: Self-pay | Admitting: Family

## 2019-08-24 VITALS — BP 119/82 | HR 63 | Resp 18 | Ht 68.0 in | Wt >= 6400 oz

## 2019-08-24 DIAGNOSIS — G4733 Obstructive sleep apnea (adult) (pediatric): Secondary | ICD-10-CM | POA: Diagnosis not present

## 2019-08-24 DIAGNOSIS — R42 Dizziness and giddiness: Secondary | ICD-10-CM | POA: Diagnosis not present

## 2019-08-24 DIAGNOSIS — M1990 Unspecified osteoarthritis, unspecified site: Secondary | ICD-10-CM | POA: Insufficient documentation

## 2019-08-24 DIAGNOSIS — I890 Lymphedema, not elsewhere classified: Secondary | ICD-10-CM | POA: Insufficient documentation

## 2019-08-24 DIAGNOSIS — I4891 Unspecified atrial fibrillation: Secondary | ICD-10-CM | POA: Insufficient documentation

## 2019-08-24 DIAGNOSIS — Z7901 Long term (current) use of anticoagulants: Secondary | ICD-10-CM | POA: Diagnosis not present

## 2019-08-24 DIAGNOSIS — Z86711 Personal history of pulmonary embolism: Secondary | ICD-10-CM | POA: Insufficient documentation

## 2019-08-24 DIAGNOSIS — M7989 Other specified soft tissue disorders: Secondary | ICD-10-CM | POA: Insufficient documentation

## 2019-08-24 DIAGNOSIS — Z79899 Other long term (current) drug therapy: Secondary | ICD-10-CM | POA: Insufficient documentation

## 2019-08-24 DIAGNOSIS — F329 Major depressive disorder, single episode, unspecified: Secondary | ICD-10-CM | POA: Diagnosis not present

## 2019-08-24 DIAGNOSIS — J449 Chronic obstructive pulmonary disease, unspecified: Secondary | ICD-10-CM | POA: Diagnosis not present

## 2019-08-24 DIAGNOSIS — R0789 Other chest pain: Secondary | ICD-10-CM | POA: Diagnosis not present

## 2019-08-24 DIAGNOSIS — I5042 Chronic combined systolic (congestive) and diastolic (congestive) heart failure: Secondary | ICD-10-CM | POA: Insufficient documentation

## 2019-08-24 DIAGNOSIS — Z8249 Family history of ischemic heart disease and other diseases of the circulatory system: Secondary | ICD-10-CM | POA: Insufficient documentation

## 2019-08-24 DIAGNOSIS — Z86718 Personal history of other venous thrombosis and embolism: Secondary | ICD-10-CM | POA: Diagnosis not present

## 2019-08-24 DIAGNOSIS — F419 Anxiety disorder, unspecified: Secondary | ICD-10-CM | POA: Insufficient documentation

## 2019-08-24 DIAGNOSIS — I110 Hypertensive heart disease with heart failure: Secondary | ICD-10-CM | POA: Diagnosis not present

## 2019-08-24 DIAGNOSIS — F10239 Alcohol dependence with withdrawal, unspecified: Secondary | ICD-10-CM | POA: Diagnosis not present

## 2019-08-24 DIAGNOSIS — I252 Old myocardial infarction: Secondary | ICD-10-CM | POA: Insufficient documentation

## 2019-08-24 DIAGNOSIS — R0602 Shortness of breath: Secondary | ICD-10-CM | POA: Insufficient documentation

## 2019-08-24 DIAGNOSIS — I10 Essential (primary) hypertension: Secondary | ICD-10-CM

## 2019-08-24 DIAGNOSIS — I5032 Chronic diastolic (congestive) heart failure: Secondary | ICD-10-CM

## 2019-08-24 NOTE — Patient Instructions (Signed)
Continue weighing daily and call for an overnight weight gain of > 2 pounds or a weekly weight gain of >5 pounds. 

## 2019-08-24 NOTE — Progress Notes (Signed)
Patient ID: [name] [name], male    DOB: 17-Jul-1960, 59 y.o.   MRN: [id number]  HPI  [name] [name] is a 59 y/o male with a history of asthma, COPD, atrial fibrillation, obstructive sleep apnea and chronic heart failure.   Echo report from 06/23/19 reviewed and showed an EF of 55-60%. Echo report from 01/17/18 reviewed and showed an EF of 30%.  Admitted 08/09/19 due to acute on chronic hypoxic respiratory failure along with clinical sepsis. Wound consult along with cardiology consult obtained. Required oxygen at 3L. Initially given antibiotics along with applying UNNA boots on bilaterally. Alcohol withdrawal occurred as he was using alcohol for pain control. Neck CT and MRI completed due to pain and then placed on steroids for inflammation. Diuretic changed. Discharged after 5 days.   He presents today for a follow-up visit although hasn't been seen since 2019. He presents with a chief complaint of moderate shortness of breath upon minimal exertion. He describes this as chronic in nature having been present for several years. He has associated fatigue, chest tightness, dizziness, difficulty sleeping, wounds on lower legs and swelling in his legs.   Goes to the wound center for change of UNNA boots and also has home health.   Past Medical History:  Diagnosis Date  . Anxiety   . Arthritis   . Asthma   . Brain damage   . Chronic pain of both knees 07/13/2018  . Clotting disorder (HCC)   . COPD (chronic obstructive pulmonary disease) (HCC)   . Depression   . HFrEF (heart failure with reduced ejection fraction) (HCC)    a. 03/2018 Echo: EF 25-30%, diff HK. Mod LAE.  [name] History of DVT (deep vein thrombosis)   . History of pulmonary embolism    a. Chronic coumadin.  [name] Hypertension   . MI (myocardial infarction) (HCC)   . Morbid obesity (HCC)   . Neck pain 07/13/2018  . NICM (nonischemic cardiomyopathy) (HCC)    a. s/p Cath x 3 - reportedly nl cors. Last cath 2019 in [place]; b. a. 03/2018 Echo: EF 25-30%,  diff HK.  [name] Persistent atrial fibrillation (HCC)    a. 03/2018 s/p DCCV; b. CHA2DS2VASc = 1-->Xarelto (later changed to warfarin); c. 05/2018 recurrent afib-->Amio initiated.  . Sleep apnea    Past Surgical History:  Procedure Laterality Date  . CARDIOVERSION N/A 03/24/2018   Procedure: CARDIOVERSION;  Surgeon: Antonieta Iba, MD;  Location: [facility] ORS;  Service: Cardiovascular;  Laterality: N/A;  . CARDIOVERSION N/A 08/08/2018   Procedure: CARDIOVERSION;  Surgeon: [name], MD;  Location: [facility] ORS;  Service: Cardiovascular;  Laterality: N/A;  . hearnia repair     X 3- total of two surgeries  . HERNIA REPAIR    . LEG SURGERY     Family History  Problem Relation Age of Onset  . Heart failure Mother   . Lung cancer Mother   . Lung cancer Father   . Heart attack Maternal Grandmother   . Heart attack Maternal Grandfather    Social History   Tobacco Use  . Smoking status: Never Smoker  . Smokeless tobacco: Never Used  Substance Use Topics  . Alcohol use: Not Currently   No Known Allergies  Prior to Admission medications   Medication Sig Start Date End Date Taking? Authorizing Provider  albuterol (VENTOLIN HFA) 108 (90 Base) MCG/ACT inhaler Inhale 2 puffs into the lungs every 6 (six) hours as needed for wheezing or shortness of breath. 07/04/18  Yes Olevia Perches  P, DO  allopurinol (ZYLOPRIM) 100 MG tablet TAKE 1 TABLET BY MOUTH ONCE DAILY 08/15/19  Yes Flinchum, [name], FNP  atorvastatin (LIPITOR) 40 MG tablet TAKE 1 TABLET BY MOUTH ONCE DAILY 08/15/19  Yes Flinchum, [name], FNP  DULoxetine (CYMBALTA) 20 MG capsule Take 1 capsule (20 mg total) by mouth daily. 05/08/19  Yes Flinchum, [name], FNP  ferrous sulfate 325 (65 FE) MG tablet Take 1 tablet (325 mg total) by mouth 2 (two) times daily with a meal. 05/08/19  Yes Flinchum, [name], FNP  gabapentin (NEURONTIN) 300 MG capsule TAKE 1 CAPSULE BY MOUTH 3 TIMES DAILY 08/15/19  Yes Flinchum, [name], FNP   levothyroxine (SYNTHROID) 50 MCG tablet Take 1 tablet (50 mcg total) by mouth daily before breakfast. Need lab work beginning of June for Thyroid 05/08/19  Yes Flinchum, [name], FNP  lisinopril (ZESTRIL) 2.5 MG tablet Take 1 tablet (2.5 mg total) by mouth daily. 05/08/19  Yes Flinchum, [name], FNP  metoprolol succinate (TOPROL-XL) 50 MG 24 hr tablet Take 1 tablet (50 mg total) by mouth daily. Take with or immediately following a meal. 06/06/19  Yes End, Cristal Deer, MD  nitroGLYCERIN (NITROSTAT) 0.4 MG SL tablet Place 1 tablet under tongue every 5 minutes as needed for chest pain. (No more than 3 doses within 15 minutes) 03/08/19  Yes Flinchum, [name], FNP  torsemide (DEMADEX) 20 MG tablet Take 1 tablet (20 mg total) by mouth 2 (two) times daily. Patient taking differently: Take 60 mg by mouth 2 (two) times daily.  08/15/19  Yes Wieting, Richard, MD  traZODone (DESYREL) 150 MG tablet TAKE 1 TABLET BY MOUTH AT BEDTIME AS NEEDED SLEEP 05/08/19  Yes Flinchum, [name], FNP  fluticasone (FLONASE) 50 MCG/ACT nasal spray Place 2 sprays into both nostrils 2 (two) times daily.    [provider]  loperamide (IMODIUM) 2 MG capsule Take 1 capsule (2 mg total) by mouth as needed for diarrhea or loose stools. 06/26/19   Arnetha Courser, MD  naphazoline-glycerin (CLEAR EYES REDNESS) 0.012-0.2 % SOLN Place 1-2 drops into both eyes 4 (four) times daily as needed for eye irritation. 08/14/19   Alford Highland, MD  oxyCODONE (OXY IR/ROXICODONE) 5 MG immediate release tablet Take 1 tablet (5 mg total) by mouth every 6 (six) hours as needed for moderate pain or severe pain. 08/14/19   Alford Highland, MD  thiamine 100 MG tablet Take 1 tablet (100 mg total) by mouth daily. 08/14/19   Alford Highland, MD  warfarin (COUMADIN) 4 MG tablet Take 1 tablet (4 mg total) by mouth daily at 4 PM. 08/14/19   Alford Highland, MD    Review of Systems  Constitutional: Positive for appetite change (decreased) and  fatigue (with minimal exertion).  HENT: Positive for rhinorrhea. Negative for congestion and sore throat.   Eyes: Negative.   Respiratory: Positive for chest tightness (constant pressure) and shortness of breath (with minimal exertion). Negative for cough.   Cardiovascular: Positive for chest pain (at times) and leg swelling. Negative for palpitations.  Gastrointestinal: Negative for abdominal distention and abdominal pain.  Endocrine: Negative.   Genitourinary: Negative.   Musculoskeletal: Positive for arthralgias (right hip) and back pain (chronic).  Skin: Positive for wound (both lower legs; wrapped in UNNA boots).  Allergic/Immunologic: Negative.   Neurological: Positive for dizziness. Negative for light-headedness.  Hematological: Negative for adenopathy. Does not bruise/bleed easily.  Psychiatric/Behavioral: Positive for sleep disturbance (wearing CPAP at night). Negative for dysphoric mood. The patient is  not nervous/anxious.    Vitals:   08/24/19 1100  BP: 119/82  Pulse: 63  Resp: 18  SpO2: 94%  Weight: (!) 417 lb (189.1 kg)  Height: 5\' 8"  (1.727 m)   Wt Readings from Last 3 Encounters:  08/24/19 (!) 417 lb (189.1 kg)  08/14/19 (!) 416 lb (188.7 kg)  06/25/19 (!) 434 lb (196.9 kg)   Lab Results  Component Value Date   CREATININE 1.15 08/14/2019   CREATININE 0.87 08/13/2019   CREATININE 0.92 08/12/2019    Physical Exam Vitals and nursing note reviewed.  Constitutional:      Appearance: He is well-developed.  HENT:     Head: Normocephalic and atraumatic.  Neck:     Vascular: No JVD.  Cardiovascular:     Rate and Rhythm: Normal rate and regular rhythm.  Pulmonary:     Effort: Pulmonary effort is normal.     Breath sounds: Normal breath sounds. No wheezing or rales.  Abdominal:     Palpations: Abdomen is soft.     Tenderness: There is no abdominal tenderness.  Musculoskeletal:     Cervical back: Normal range of motion and neck supple.     Right lower leg: No  tenderness. Edema (wrapped in [place] boot) present.     Left lower leg: No tenderness. Edema (wrapped in [place] boot) present.  Skin:    General: Skin is warm and dry.  Neurological:     General: No focal deficit present.     Mental Status: He is alert and oriented to person, place, and time.  Psychiatric:        Mood and Affect: Mood normal.        Behavior: Behavior normal.    Assessment & Plan:  1: Chronic heart failure with preserved ejection fraction- - NYHA class III - euvolemic today - not weighing daily but does have scales; encouraged to resume weighing daily and to call for an overnight weight gain of >2 pounds or a weekly weight gain of >5 pounds - not adding salt and has been trying to read food labels for sodium content - saw cardiology [place]) 07/25/19 - BNP 08/09/19 was 67.1 - participating in paramedicine program  2: HTN- - BP looks good today - saw PCP 08/11/19) 05/21/19 - BMP 08/14/19 reviewed and showed sodium 138, potassium 4.0, creatinine 1.15 and GFR >60  3: Obstructive sleep apnea- - wearing CPAP nightly - has oxygen at 3L that he wears at home but he didn't bring it with him today; waiting on portable tanks  4: Lymphedema- - stage 2 - went to wound clinic 07/27/19 - currently has both lower legs wrapped in UNNA boots   Patient did not bring his medications nor a list. Each medication was verbally reviewed with the patient and he was encouraged to bring the bottles to every visit to confirm accuracy of list.  Return in 3 months or sooner for any questions/problems before them.

## 2019-08-28 ENCOUNTER — Ambulatory Visit: Payer: Self-pay | Admitting: *Deleted

## 2019-08-28 NOTE — Chronic Care Management (AMB) (Signed)
  Chronic Care Management   Social Work Note  08/28/2019 Name: [name] [name] MRN: [id number] DOB: 05-02-1960  [name] [name] is a 59 y.o. year old male who sees Flinchum, [name], FNP for primary care. The CCM team was consulted for assistance with [name] .   Phone call from [facility] nurse at the Wound Center informing this social worker that patient has cancelled his appointment for Friday due to planned illness. This Child psychotherapist confirmed similar experience with patient related to difficulty maintaining contact and history of poor follow up. Nurse informed of current APS involvement to address above issues.  SDOH (Social Determinants of Health) assessments performed: No     Outpatient Encounter Medications as of 08/28/2019  Medication Sig  . albuterol (VENTOLIN HFA) 108 (90 Base) MCG/ACT inhaler Inhale 2 puffs into the lungs every 6 (six) hours as needed for wheezing or shortness of breath.  . allopurinol (ZYLOPRIM) 100 MG tablet TAKE 1 TABLET BY MOUTH ONCE DAILY  . atorvastatin (LIPITOR) 40 MG tablet TAKE 1 TABLET BY MOUTH ONCE DAILY  . DULoxetine (CYMBALTA) 20 MG capsule Take 1 capsule (20 mg total) by mouth daily.  . ferrous sulfate 325 (65 FE) MG tablet Take 1 tablet (325 mg total) by mouth 2 (two) times daily with a meal.  . fluticasone (FLONASE) 50 MCG/ACT nasal spray Place 2 sprays into both nostrils 2 (two) times daily.  [name] gabapentin (NEURONTIN) 300 MG capsule TAKE 1 CAPSULE BY MOUTH 3 TIMES DAILY  . levothyroxine (SYNTHROID) 50 MCG tablet Take 1 tablet (50 mcg total) by mouth daily before breakfast. Need lab work beginning of June for Thyroid  . lisinopril (ZESTRIL) 2.5 MG tablet Take 1 tablet (2.5 mg total) by mouth daily.  [name] loperamide (IMODIUM) 2 MG capsule Take 1 capsule (2 mg total) by mouth as needed for diarrhea or loose stools.  . metoprolol succinate (TOPROL-XL) 50 MG 24 hr tablet Take 1 tablet (50 mg total) by mouth daily. Take with or immediately following  a meal.  . naphazoline-glycerin (CLEAR EYES REDNESS) 0.012-0.2 % SOLN Place 1-2 drops into both eyes 4 (four) times daily as needed for eye irritation.  . nitroGLYCERIN (NITROSTAT) 0.4 MG SL tablet Place 1 tablet under tongue every 5 minutes as needed for chest pain. (No more than 3 doses within 15 minutes)  . oxyCODONE (OXY IR/ROXICODONE) 5 MG immediate release tablet Take 1 tablet (5 mg total) by mouth every 6 (six) hours as needed for moderate pain or severe pain. (Patient not taking: Reported on 08/24/2019)  . thiamine 100 MG tablet Take 1 tablet (100 mg total) by mouth daily.  [name] torsemide (DEMADEX) 20 MG tablet Take 1 tablet (20 mg total) by mouth 2 (two) times daily. (Patient taking differently: Take 60 mg by mouth 2 (two) times daily. )  . traZODone (DESYREL) 150 MG tablet TAKE 1 TABLET BY MOUTH AT BEDTIME AS NEEDED SLEEP  . warfarin (COUMADIN) 4 MG tablet Take 1 tablet (4 mg total) by mouth daily at 4 PM.   No facility-administered encounter medications on file as of 08/28/2019.    Goals Addressed   None        Chike Farrington, LCSW Clinical Social Worker  [facility] [phone]

## 2019-08-30 ENCOUNTER — Other Ambulatory Visit: Payer: Self-pay

## 2019-08-30 ENCOUNTER — Encounter: Payer: Self-pay | Admitting: Emergency Medicine

## 2019-08-30 ENCOUNTER — Ambulatory Visit (INDEPENDENT_AMBULATORY_CARE_PROVIDER_SITE_OTHER): Payer: Medicaid Other | Admitting: Adult Health

## 2019-08-30 ENCOUNTER — Emergency Department
Admission: EM | Admit: 2019-08-30 | Discharge: 2019-09-01 | Disposition: A | Discharge status: Home / Self Care | Payer: Medicaid Other | Attending: Emergency Medicine | Admitting: Emergency Medicine

## 2019-08-30 ENCOUNTER — Emergency Department: Payer: Medicaid Other

## 2019-08-30 DIAGNOSIS — Z659 Problem related to unspecified psychosocial circumstances: Secondary | ICD-10-CM

## 2019-08-30 DIAGNOSIS — R0602 Shortness of breath: Secondary | ICD-10-CM | POA: Insufficient documentation

## 2019-08-30 DIAGNOSIS — R1010 Upper abdominal pain, unspecified: Secondary | ICD-10-CM | POA: Insufficient documentation

## 2019-08-30 DIAGNOSIS — J449 Chronic obstructive pulmonary disease, unspecified: Secondary | ICD-10-CM | POA: Insufficient documentation

## 2019-08-30 DIAGNOSIS — R0789 Other chest pain: Secondary | ICD-10-CM | POA: Diagnosis not present

## 2019-08-30 DIAGNOSIS — I517 Cardiomegaly: Secondary | ICD-10-CM | POA: Diagnosis not present

## 2019-08-30 DIAGNOSIS — I110 Hypertensive heart disease with heart failure: Secondary | ICD-10-CM | POA: Diagnosis not present

## 2019-08-30 DIAGNOSIS — R110 Nausea: Secondary | ICD-10-CM | POA: Diagnosis present

## 2019-08-30 DIAGNOSIS — R112 Nausea with vomiting, unspecified: Secondary | ICD-10-CM

## 2019-08-30 DIAGNOSIS — R000 Tachycardia, unspecified: Secondary | ICD-10-CM | POA: Diagnosis not present

## 2019-08-30 DIAGNOSIS — I4891 Unspecified atrial fibrillation: Secondary | ICD-10-CM | POA: Diagnosis not present

## 2019-08-30 DIAGNOSIS — R079 Chest pain, unspecified: Secondary | ICD-10-CM | POA: Diagnosis not present

## 2019-08-30 DIAGNOSIS — R1084 Generalized abdominal pain: Secondary | ICD-10-CM | POA: Diagnosis not present

## 2019-08-30 DIAGNOSIS — R062 Wheezing: Secondary | ICD-10-CM | POA: Diagnosis not present

## 2019-08-30 DIAGNOSIS — R21 Rash and other nonspecific skin eruption: Secondary | ICD-10-CM | POA: Diagnosis not present

## 2019-08-30 DIAGNOSIS — I509 Heart failure, unspecified: Secondary | ICD-10-CM | POA: Diagnosis not present

## 2019-08-30 DIAGNOSIS — Z789 Other specified health status: Secondary | ICD-10-CM

## 2019-08-30 DIAGNOSIS — R001 Bradycardia, unspecified: Secondary | ICD-10-CM | POA: Diagnosis not present

## 2019-08-30 DIAGNOSIS — Z5329 Procedure and treatment not carried out because of patient's decision for other reasons: Secondary | ICD-10-CM

## 2019-08-30 DIAGNOSIS — J9611 Chronic respiratory failure with hypoxia: Secondary | ICD-10-CM

## 2019-08-30 LAB — URINALYSIS, COMPLETE (UACMP) WITH MICROSCOPIC
Bacteria, UA: NONE SEEN
Bilirubin Urine: NEGATIVE
Glucose, UA: NEGATIVE mg/dL
Ketones, ur: NEGATIVE mg/dL
Nitrite: NEGATIVE
Protein, ur: 30 mg/dL — AB
Specific Gravity, Urine: 1.014 (ref 1.005–1.030)
pH: 7 (ref 5.0–8.0)

## 2019-08-30 LAB — CBC WITH DIFFERENTIAL/PLATELET
Abs Immature Granulocytes: 0.01 10*3/uL (ref 0.00–0.07)
Basophils Absolute: 0.1 10*3/uL (ref 0.0–0.1)
Basophils Relative: 1 %
Eosinophils Absolute: 0.2 10*3/uL (ref 0.0–0.5)
Eosinophils Relative: 3 %
HCT: 39.4 % (ref 39.0–52.0)
Hemoglobin: 12.8 g/dL — ABNORMAL LOW (ref 13.0–17.0)
Immature Granulocytes: 0 %
Lymphocytes Relative: 24 %
Lymphs Abs: 1.5 10*3/uL (ref 0.7–4.0)
MCH: 29.2 pg (ref 26.0–34.0)
MCHC: 32.5 g/dL (ref 30.0–36.0)
MCV: 89.7 fL (ref 80.0–100.0)
Monocytes Absolute: 0.8 10*3/uL (ref 0.1–1.0)
Monocytes Relative: 13 %
Neutro Abs: 3.8 10*3/uL (ref 1.7–7.7)
Neutrophils Relative %: 59 %
Platelets: 283 10*3/uL (ref 150–400)
RBC: 4.39 MIL/uL (ref 4.22–5.81)
RDW: 17.5 % — ABNORMAL HIGH (ref 11.5–15.5)
WBC: 6.4 10*3/uL (ref 4.0–10.5)
nRBC: 0 % (ref 0.0–0.2)

## 2019-08-30 LAB — COMPREHENSIVE METABOLIC PANEL
ALT: 42 U/L (ref 0–44)
AST: 37 U/L (ref 15–41)
Albumin: 3.6 g/dL (ref 3.5–5.0)
Alkaline Phosphatase: 73 U/L (ref 38–126)
Anion gap: 15 (ref 5–15)
BUN: 5 mg/dL — ABNORMAL LOW (ref 6–20)
CO2: 25 mmol/L (ref 22–32)
Calcium: 8.4 mg/dL — ABNORMAL LOW (ref 8.9–10.3)
Chloride: 97 mmol/L — ABNORMAL LOW (ref 98–111)
Creatinine, Ser: 0.89 mg/dL (ref 0.61–1.24)
GFR calc Af Amer: >60 mL/min (ref >60)
GFR calc non Af Amer: >60 mL/min (ref >60)
Glucose, Bld: 158 mg/dL — ABNORMAL HIGH (ref 70–99)
Potassium: 3.7 mmol/L (ref 3.5–5.1)
Sodium: 137 mmol/L (ref 135–145)
Total Bilirubin: 0.8 mg/dL (ref 0.3–1.2)
Total Protein: 7.3 g/dL (ref 6.5–8.1)

## 2019-08-30 LAB — LIPASE, BLOOD: Lipase: 46 U/L (ref 11–51)

## 2019-08-30 LAB — PROTIME-INR
INR: 2.5 — ABNORMAL HIGH (ref 0.8–1.2)
Prothrombin Time: 25.9 seconds — ABNORMAL HIGH (ref 11.4–15.2)

## 2019-08-30 LAB — ETHANOL: Alcohol, Ethyl (B): 45 mg/dL — ABNORMAL HIGH (ref <10)

## 2019-08-30 LAB — TROPONIN I (HIGH SENSITIVITY): Troponin I (High Sensitivity): 36 ng/L — ABNORMAL HIGH (ref <18)

## 2019-08-30 LAB — PROCALCITONIN: Procalcitonin: <0.1 ng/mL

## 2019-08-30 MED ORDER — ONDANSETRON 4 MG PO TBDP
4.0000 mg | ORAL_TABLET | Freq: Once | ORAL | Status: AC
  Administered 2019-08-30: 4 mg via ORAL
  Filled 2019-08-30: qty 1

## 2019-08-30 MED ORDER — WARFARIN - PHYSICIAN DOSING INPATIENT
Freq: Every day | Status: DC
  Filled 2019-08-30: qty 1

## 2019-08-30 MED ORDER — DULOXETINE HCL 20 MG PO CPEP
20.0000 mg | ORAL_CAPSULE | Freq: Every day | ORAL | Status: DC
  Administered 2019-08-30 – 2019-09-01 (×3): 20 mg via ORAL
  Filled 2019-08-30 (×3): qty 1

## 2019-08-30 MED ORDER — ATORVASTATIN CALCIUM 20 MG PO TABS
40.0000 mg | ORAL_TABLET | Freq: Every day | ORAL | Status: DC
  Administered 2019-08-30 – 2019-09-01 (×3): 40 mg via ORAL
  Filled 2019-08-30 (×3): qty 2

## 2019-08-30 MED ORDER — LACTATED RINGERS IV BOLUS (SEPSIS)
500.0000 mL | Freq: Once | INTRAVENOUS | Status: AC
  Administered 2019-08-30: 500 mL via INTRAVENOUS

## 2019-08-30 MED ORDER — LORAZEPAM 1 MG PO TABS
1.0000 mg | ORAL_TABLET | Freq: Once | ORAL | Status: AC
  Administered 2019-08-30: 1 mg via ORAL
  Filled 2019-08-30: qty 1

## 2019-08-30 MED ORDER — METOPROLOL SUCCINATE ER 50 MG PO TB24
50.0000 mg | ORAL_TABLET | Freq: Every day | ORAL | Status: DC
  Administered 2019-08-30 – 2019-09-01 (×3): 50 mg via ORAL
  Filled 2019-08-30 (×3): qty 1

## 2019-08-30 MED ORDER — LIDOCAINE VISCOUS HCL 2 % MT SOLN
15.0000 mL | Freq: Once | OROMUCOSAL | Status: AC
  Administered 2019-08-30: 15 mL via ORAL
  Filled 2019-08-30: qty 15

## 2019-08-30 MED ORDER — ALUM & MAG HYDROXIDE-SIMETH 200-200-20 MG/5ML PO SUSP
30.0000 mL | Freq: Once | ORAL | Status: AC
  Administered 2019-08-30: 30 mL via ORAL
  Filled 2019-08-30: qty 30

## 2019-08-30 MED ORDER — TORSEMIDE 20 MG PO TABS
20.0000 mg | ORAL_TABLET | Freq: Two times a day (BID) | ORAL | Status: DC
  Administered 2019-08-30 – 2019-09-01 (×4): 20 mg via ORAL
  Filled 2019-08-30 (×6): qty 1

## 2019-08-30 MED ORDER — ONDANSETRON HCL 4 MG/2ML IJ SOLN
4.0000 mg | Freq: Once | INTRAMUSCULAR | Status: AC
  Administered 2019-08-30: 4 mg via INTRAVENOUS
  Filled 2019-08-30: qty 2

## 2019-08-30 MED ORDER — LISINOPRIL 5 MG PO TABS
2.5000 mg | ORAL_TABLET | Freq: Every day | ORAL | Status: DC
  Administered 2019-08-30 – 2019-09-01 (×3): 2.5 mg via ORAL
  Filled 2019-08-30 (×3): qty 1

## 2019-08-30 MED ORDER — METOPROLOL TARTRATE 5 MG/5ML IV SOLN: 5.0000 mg | Freq: Once | INTRAVENOUS | Status: AC

## 2019-08-30 MED ORDER — ALLOPURINOL 100 MG PO TABS
100.0000 mg | ORAL_TABLET | Freq: Every day | ORAL | Status: DC
  Administered 2019-08-30 – 2019-09-01 (×3): 100 mg via ORAL
  Filled 2019-08-30 (×3): qty 1

## 2019-08-30 MED ORDER — THIAMINE HCL 100 MG PO TABS
100.0000 mg | ORAL_TABLET | Freq: Every day | ORAL | Status: DC
  Administered 2019-08-30 – 2019-09-01 (×3): 100 mg via ORAL
  Filled 2019-08-30 (×4): qty 1

## 2019-08-30 MED ORDER — WARFARIN SODIUM 4 MG PO TABS
4.0000 mg | ORAL_TABLET | Freq: Every day | ORAL | Status: DC
  Administered 2019-08-30 – 2019-08-31 (×2): 4 mg via ORAL
  Filled 2019-08-30 (×4): qty 1

## 2019-08-30 MED ORDER — GABAPENTIN 300 MG PO CAPS
300.0000 mg | ORAL_CAPSULE | Freq: Three times a day (TID) | ORAL | Status: DC
  Administered 2019-08-30 – 2019-09-01 (×7): 300 mg via ORAL
  Filled 2019-08-30 (×7): qty 1

## 2019-08-30 MED ORDER — OXYCODONE HCL 5 MG PO TABS
5.0000 mg | ORAL_TABLET | Freq: Four times a day (QID) | ORAL | Status: DC | PRN
  Administered 2019-08-30 – 2019-09-01 (×6): 5 mg via ORAL
  Filled 2019-08-30 (×7): qty 1

## 2019-08-30 MED ORDER — LORAZEPAM 2 MG/ML IJ SOLN
1.0000 mg | Freq: Once | INTRAMUSCULAR | Status: AC
  Administered 2019-08-30: 1 mg via INTRAVENOUS
  Filled 2019-08-30 (×2): qty 1

## 2019-08-30 MED ORDER — MORPHINE SULFATE (PF) 4 MG/ML IV SOLN
4.0000 mg | Freq: Once | INTRAVENOUS | Status: AC
  Administered 2019-08-30: 4 mg via INTRAVENOUS
  Filled 2019-08-30: qty 1

## 2019-08-30 MED ORDER — METOPROLOL TARTRATE 5 MG/5ML IV SOLN
INTRAVENOUS | Status: AC
  Administered 2019-08-30: 5 mg via INTRAVENOUS
  Filled 2019-08-30: qty 5

## 2019-08-30 MED ORDER — TRAZODONE HCL 100 MG PO TABS
100.0000 mg | ORAL_TABLET | Freq: Every day | ORAL | Status: DC
  Administered 2019-08-30 – 2019-08-31 (×2): 100 mg via ORAL
  Filled 2019-08-30 (×2): qty 1

## 2019-08-30 MED ORDER — FLUTICASONE PROPIONATE 50 MCG/ACT NA SUSP
2.0000 | Freq: Two times a day (BID) | NASAL | Status: DC
  Filled 2019-08-30: qty 16

## 2019-08-30 MED ORDER — FERROUS SULFATE 325 (65 FE) MG PO TABS
325.0000 mg | ORAL_TABLET | Freq: Two times a day (BID) | ORAL | Status: DC
  Administered 2019-08-30 – 2019-09-01 (×4): 325 mg via ORAL
  Filled 2019-08-30 (×6): qty 1

## 2019-08-30 MED ORDER — LEVOTHYROXINE SODIUM 50 MCG PO TABS
50.0000 ug | ORAL_TABLET | Freq: Every day | ORAL | Status: DC
  Administered 2019-08-31 – 2019-09-01 (×2): 50 ug via ORAL
  Filled 2019-08-30 (×2): qty 1

## 2019-08-30 MED ORDER — ALBUTEROL SULFATE (2.5 MG/3ML) 0.083% IN NEBU: 2.5000 mg | INHALATION_SOLUTION | Freq: Four times a day (QID) | RESPIRATORY_TRACT | Status: DC | PRN

## 2019-08-30 MED ORDER — PROMETHAZINE HCL 25 MG/ML IJ SOLN
25.0000 mg | Freq: Once | INTRAMUSCULAR | Status: AC
  Administered 2019-08-30: 25 mg via INTRAVENOUS
  Filled 2019-08-30: qty 1

## 2019-08-30 MED ORDER — NAPHAZOLINE-GLYCERIN 0.012-0.2 % OP SOLN
1.0000 [drp] | Freq: Four times a day (QID) | OPHTHALMIC | Status: DC | PRN
  Filled 2019-08-30: qty 15

## 2019-08-30 MED ORDER — LOPERAMIDE HCL 2 MG PO CAPS: 2.0000 mg | ORAL_CAPSULE | ORAL | Status: DC | PRN

## 2019-08-30 NOTE — ED Notes (Signed)
Pt given lunch tray.

## 2019-08-30 NOTE — ED Notes (Signed)
IV team at bedside 

## 2019-08-30 NOTE — ED Notes (Signed)
This Rn entered room, pt in wheel chair at sinks. States he is at the sink because he is thirsty. Informed pt that because he ws so loudly retching he should not be putting antyhing on his stomach until okay'd by doctor. PT proceeded to fill up cup and continue drinking. PT states he does not want to get in bed This RN also attempted IV x1 with no success.

## 2019-08-30 NOTE — Progress Notes (Signed)
No show for last appointment patient was aware and scheduled this appointment 08/30/19 8:40am. Patient is aware to find a new PCP and seek emergency care as needed. 30 day emergency care was provided. See Letter.

## 2019-08-30 NOTE — ED Notes (Signed)
Pt in bed at this time on right side. Pt reports comfort, denies any needs at this time. Pt states he is going to attempt to sleep, call light in reach, lights off in room, bed rails raised

## 2019-08-30 NOTE — ED Provider Notes (Signed)
ER Provider Note       Time seen: 7:01 AM   I have reviewed the vital signs and the nursing notes.  HISTORY Chief Complaint Nausea   HPI [name] [name] is a 59 y.o. male with a history of anxiety, arthritis, COPD, depression, heart failure, PE, hypertension, MI, A. fib, sleep apnea, morbid obesity who presents today for nausea with upper abdominal pain and shortness of breath.  EMS brought him in from home.  Discomfort is 8 out of 10.  Patient states he was recently admitted for wound infection on his legs.  Had his legs rewrapped yesterday and states they are improving.  Past Medical History:  Diagnosis Date  . Anxiety   . Arthritis   . Asthma   . Brain damage   . Chronic pain of both knees 07/13/2018  . Clotting disorder (HCC)   . COPD (chronic obstructive pulmonary disease) (HCC)   . Depression   . HFrEF (heart failure with reduced ejection fraction) (HCC)    a. 03/2018 Echo: EF 25-30%, diff HK. Mod LAE.  [name] History of DVT (deep vein thrombosis)   . History of pulmonary embolism    a. Chronic coumadin.  [name] Hypertension   . MI (myocardial infarction) (HCC)   . Morbid obesity (HCC)   . Neck pain 07/13/2018  . NICM (nonischemic cardiomyopathy) (HCC)    a. s/p Cath x 3 - reportedly nl cors. Last cath 2019 in [place]; b. a. 03/2018 Echo: EF 25-30%, diff HK.  [name] Persistent atrial fibrillation (HCC)    a. 03/2018 s/p DCCV; b. CHA2DS2VASc = 1-->Xarelto (later changed to warfarin); c. 05/2018 recurrent afib-->Amio initiated.  . Sleep apnea     Past Surgical History:  Procedure Laterality Date  . CARDIOVERSION N/A 03/24/2018   Procedure: CARDIOVERSION;  Surgeon: Antonieta Iba, MD;  Location: [facility] ORS;  Service: Cardiovascular;  Laterality: N/A;  . CARDIOVERSION N/A 08/08/2018   Procedure: CARDIOVERSION;  Surgeon: [name], MD;  Location: [facility] ORS;  Service: Cardiovascular;  Laterality: N/A;  . hearnia repair     X 3- total of two surgeries  . HERNIA REPAIR    . LEG SURGERY       Allergies Patient has no known allergies.  Review of Systems Constitutional: Negative for fever. Cardiovascular: Negative for chest pain. Respiratory: Positive for shortness of breath Gastrointestinal: Positive for abdominal pain, positive for nausea Musculoskeletal: Negative for back pain. Skin: Negative for rash. Neurological: Negative for headaches, focal weakness or numbness.  All systems negative/normal/unremarkable except as stated in the HPI  ____________________________________________   PHYSICAL EXAM:  VITAL SIGNS: Vitals:   08/30/19 0313  BP: (!) 130/107  Pulse: (!) 109  Resp: (!) 22  Temp: 97.9 F (36.6 C)  SpO2: 93%    Constitutional: Alert and oriented.  Morbidly obese, no distress Eyes: Conjunctivae are normal. Normal extraocular movements. ENT      Head: Normocephalic and atraumatic.      Nose: No congestion/rhinnorhea.      Mouth/Throat: Mucous membranes are moist.      Neck: No stridor. Cardiovascular: Rapid rate, irregular rhythm. No murmurs, rubs, or gallops. Respiratory: Normal respiratory effort without tachypnea nor retractions.  Mild wheezing bilaterally Gastrointestinal: Soft and nontender. Normal bowel sounds Musculoskeletal: Nontender with normal range of motion in extremities.  Lower extremity edema with Unna boot dressings in place Neurologic:  Normal speech and language. No gross focal neurologic deficits are appreciated.  Skin:  Skin is warm, dry and intact. No rash noted.  Psychiatric: Depressed mood and affect ____________________________________________  EKG: Interpreted by me.  Atrial fibrillation with rapid ventricular response, rate is 129 bpm, right axis deviation, normal QT  ____________________________________________   LABS (pertinent positives/negatives)  Labs Reviewed  CBC WITH DIFFERENTIAL/PLATELET - Abnormal; Notable for the following components:      Result Value   Hemoglobin 12.8 (*)    RDW 17.5 (*)    All  other components within normal limits  COMPREHENSIVE METABOLIC PANEL - Abnormal; Notable for the following components:   Chloride 97 (*)    Glucose, Bld 158 (*)    BUN 5 (*)    Calcium 8.4 (*)    All other components within normal limits  URINALYSIS, COMPLETE (UACMP) WITH MICROSCOPIC - Abnormal; Notable for the following components:   Color, Urine YELLOW (*)    APPearance HAZY (*)    Hgb urine dipstick MODERATE (*)    Protein, ur 30 (*)    Leukocytes,Ua SMALL (*)    All other components within normal limits  ETHANOL - Abnormal; Notable for the following components:   Alcohol, Ethyl (B) 45 (*)    All other components within normal limits  TROPONIN I (HIGH SENSITIVITY) - Abnormal; Notable for the following components:   Troponin I (High Sensitivity) 36 (*)    All other components within normal limits  LIPASE, BLOOD  PROCALCITONIN    RADIOLOGY  Images were viewed by me CXR IMPRESSION: No active cardiopulmonary disease.  DIFFERENTIAL DIAGNOSIS  Gastritis, GERD, CHF, COPD, pneumonia, gastritis, gastroenteritis, dehydration, electrolyte abnormality, intoxication  ASSESSMENT AND PLAN  Abdominal pain, nausea, dyspnea   Plan: The patient had presented for multiple complaints. Patient's labs did not reveal any acute process, everything seemed to be at his baseline.  Mild intoxication was noted, he has a chronic elevated troponin.  He has had some nausea for which she was given antiemetics.  Overall he appears to be not taking care of himself very well and he is concerned about going home.  He is asking to speak with a Child psychotherapist about assisted living placement.  Daryel November MD    Note: This note was generated in part or whole with voice recognition software. Voice recognition is usually quite accurate but there are transcription errors that can and very often do occur. I apologize for any typographical errors that were not detected and corrected.     Emily Filbert, MD 08/30/19 1022

## 2019-08-30 NOTE — ED Triage Notes (Addendum)
Pt to triage via w/c with no distress noted, tremors noted to hands; pt reports nausea since 11pm accomp by left upper abd pain and SHOB ; EMS brought pt in from home

## 2019-08-30 NOTE — TOC Initial Note (Signed)
Transition of Care [facility]) - Initial/Assessment Note    Patient Details  Name: [name] [name] MRN: [id number] Date of Birth: [birth year]  Transition of Care [facility]) CM/SW Contact:    Selma Cellar, RN Phone Number: 08/30/2019, 12:02 PM  Clinical Narrative:                 Spoke to patient at bedside. Patient states he has [facility] coming twice weekly to assist with bathing, dressing and wound care however he is interested in possible placement in facility. Patient states he uses Medicaid transport and drives occasionally to appointments. Has some issues with getting his medications despite Medicaid as the total copays are often $70. Patient states his wife passed away a year ago unexpectedly. Patient states he has constant pain due to needing hip replacement however he was not approved for hip replacement until he was able to lose some weight Patient is not interested in [facility]/Group home placement and wants a single occupancy room in facility. RN CM explained unlikely to find single room but Clinical research associate will certainly try.    Expected Discharge Plan: Skilled Nursing Facility Barriers to Discharge: Continued Medical Work up   Patient Goals and CMS Choice Patient states their goals for this hospitalization and ongoing recovery are:: Get placed in facility CMS Medicare.gov Compare Post Acute Care list provided to:: Patient Choice offered to / list presented to : Patient  Expected Discharge Plan and Services Expected Discharge Plan: Skilled Nursing Facility In-house Referral: Clinical Social Work Discharge Planning Services: CM Consult   Living arrangements for the past 2 months: Apartment                 DME Arranged: Oxygen                    Prior Living Arrangements/Services Living arrangements for the past 2 months: Apartment Lives with:: Self Patient language and need for interpreter reviewed:: Yes Do you feel safe going back to the place where you live?: Yes      Need  for Family Participation in Patient Care: Yes (Comment) Care giver support system in place?: Yes (comment) Current home services: DME, Homehealth aide, Home RN Criminal Activity/Legal Involvement Pertinent to Current Situation/Hospitalization: No - Comment as needed  Activities of Daily Living      Permission Sought/Granted Permission sought to share information with : Case Manager, Magazine features editor Permission granted to share information with : Yes, Verbal Permission Granted  Share Information with NAME: TOC Department           Emotional Assessment Appearance:: Appears older than stated age Attitude/Demeanor/Rapport: Complaining, Engaged Affect (typically observed): Agitated, Anxious Orientation: : Oriented to Self, Oriented to Place, Oriented to  Time, Oriented to Situation Alcohol / Substance Use: Not Applicable Psych Involvement: No (comment)  Admission diagnosis:  [facility] -  Patient Active Problem List   Diagnosis Date Noted  . Alcohol withdrawal syndrome without complication (HCC)   . Lower extremity ulceration, unspecified laterality, with fat layer exposed (HCC) 08/09/2019  . Alcoholic intoxication without complication (HCC) 08/09/2019  . Acute on chronic diastolic CHF (congestive heart failure) (HCC)   . Alcohol abuse   . Atrial fibrillation, chronic (HCC)   . Pressure injury of skin 06/26/2019  . Cellulitis due to methicillin-resistant Staphylococcus aureus (MRSA) 06/23/2019  . Iron deficiency anemia 06/22/2019  . Depression 06/22/2019  . Troponin level elevated 06/22/2019  . Left-sided Bell's palsy 05/08/2019  . Slurred speech 05/03/2019  .  Chronic intractable headache 05/03/2019  . Head injuries, initial encounter 05/03/2019  . Hand pain, left 05/03/2019  . Noncompliance by refusing service 05/03/2019  . Facial droop 05/02/2019  . Pharmacologic therapy 04/11/2019  . Disorder of skeletal system 04/11/2019  . Problems influencing health status  04/11/2019  . Chronic anticoagulation (warfarin  COUMADIN) 04/11/2019  . Hypocalcemia 04/11/2019  . Elevated sed rate 04/11/2019  . Elevated C-reactive protein (CRP) 04/11/2019  . Elevated hemoglobin A1c 04/11/2019  . Hypoalbuminemia 04/11/2019  . Edema due to hypoalbuminemia 04/11/2019  . Elevated brain natriuretic peptide (BNP) level 04/11/2019  . Chronic hip pain (Right) 04/11/2019  . Osteoarthritis of hip (Right) 04/11/2019  . Atrial fibrillation with RVR (HCC) 03/08/2019  . Degenerative joint disease of right hip 03/05/2019  . Hypothyroidism 03/04/2019  . Chronic venous stasis dermatitis of both lower extremities 03/04/2019  . Long term (current) use of anticoagulants 02/23/2019  . Cellulitis 01/22/2019  . PE (pulmonary thromboembolism) (HCC) 01/21/2019  . Open leg wound 01/21/2019  . HLD (hyperlipidemia) 01/21/2019  . CAD (coronary artery disease) 01/21/2019  . Cellulitis of both lower extremities 01/21/2019  . Noncompliance 01/09/2019  . Acquired thrombophilia (HCC) 11/28/2018  . Subtherapeutic international normalized ratio (INR) 11/11/2018  . Acute pulmonary embolism (HCC) 11/01/2018  . Depression, major, single episode, moderate (HCC) 09/17/2018  . Chest pain 08/06/2018  . Personal history of DVT (deep vein thrombosis) 07/13/2018  . History of pulmonary embolism (on Coumadin) 07/13/2018  . Neck pain 07/13/2018  . Chronic low back pain (Bilateral)  w/ sciatica (Bilateral) 07/13/2018  . TBI (traumatic brain injury) (HCC) 07/04/2018  . Anemia 06/27/2018  . Abnormal thyroid blood test 06/27/2018  . Diet-controlled diabetes mellitus (HCC) 06/06/2018  . HTN (hypertension) 06/06/2018  . Chronic gout involving toe, unspecified cause, unspecified laterality 06/06/2018  . Morbid obesity with BMI of 60.0-69.9, adult (HCC) 06/06/2018  . Chronic pain syndrome 06/06/2018  . Ulcers of both lower extremities, limited to breakdown of skin (HCC) 06/06/2018  . Gout 06/06/2018  .  Persistent atrial fibrillation (HCC)   . Sepsis (HCC) 03/17/2018  . Chronic combined systolic and diastolic heart failure (HCC) 02/02/2018  . COPD (chronic obstructive pulmonary disease) (HCC) 02/02/2018  . Obstructive sleep apnea 02/02/2018  . Lymphedema 02/02/2018  . Acute on chronic systolic CHF (congestive heart failure) (HCC) 02/02/2018  . Acute on chronic respiratory failure with hypoxia (HCC) 01/27/2018   PCP:  Patient, No Pcp Per Pharmacy:   [facility] - [place], [place] - [address] [place] [address] Phone: [phone] Fax: [phone]     Social Determinants of Health (SDOH) Interventions    Readmission Risk Interventions Readmission Risk Prevention Plan 01/25/2019 10/25/2018 08/08/2018  Transportation Screening Complete Complete Complete  PCP or Specialist Appt within 3-5 Days - Complete Complete  HRI or Home Care Consult - Complete Complete  Social Work Consult for Recovery Care Planning/Counseling - Complete Patient refused  Palliative Care Screening - Not Applicable Not Applicable  Medication Review Oceanographer) Complete Complete Complete  PCP or Specialist appointment within 3-5 days of discharge Complete - -  HRI or Home Care Consult Complete - -  SW Recovery Care/Counseling Consult Complete - -  Palliative Care Screening Not Applicable - -  Skilled Nursing Facility Not Applicable - -

## 2019-08-31 ENCOUNTER — Ambulatory Visit: Payer: Medicaid Other | Admitting: Internal Medicine

## 2019-08-31 MED ORDER — LORAZEPAM 2 MG PO TABS
0.0000 mg | ORAL_TABLET | Freq: Four times a day (QID) | ORAL | Status: DC
  Administered 2019-09-01: 1 mg via ORAL
  Administered 2019-09-01: 2 mg via ORAL
  Filled 2019-08-31 (×2): qty 1

## 2019-08-31 MED ORDER — LORAZEPAM 2 MG/ML IJ SOLN: 0.0000 mg | Freq: Two times a day (BID) | INTRAMUSCULAR | Status: DC

## 2019-08-31 MED ORDER — THIAMINE HCL 100 MG PO TABS
100.0000 mg | ORAL_TABLET | Freq: Every day | ORAL | Status: DC
  Administered 2019-08-31 – 2019-09-01 (×2): 100 mg via ORAL
  Filled 2019-08-31: qty 1

## 2019-08-31 MED ORDER — LORAZEPAM 2 MG PO TABS: 0.0000 mg | ORAL_TABLET | Freq: Two times a day (BID) | ORAL | Status: DC

## 2019-08-31 MED ORDER — LORAZEPAM 2 MG/ML IJ SOLN
0.0000 mg | Freq: Four times a day (QID) | INTRAMUSCULAR | Status: DC
  Administered 2019-08-31: 1 mg via INTRAVENOUS
  Filled 2019-08-31: qty 1

## 2019-08-31 MED ORDER — THIAMINE HCL 100 MG/ML IJ SOLN: 100.0000 mg | Freq: Every day | INTRAMUSCULAR | Status: DC

## 2019-08-31 MED ORDER — LORATADINE 10 MG PO TABS
10.0000 mg | ORAL_TABLET | Freq: Every day | ORAL | Status: DC
  Administered 2019-08-31 – 2019-09-01 (×2): 10 mg via ORAL
  Filled 2019-08-31 (×2): qty 1

## 2019-08-31 NOTE — ED Notes (Signed)
Pt c/o allergy sx and requesting medication. NP consulted. See MAR. Pt also reporting generalized pain at this time.

## 2019-08-31 NOTE — TOC Progression Note (Signed)
Transition of Care [facility]) - Progression Note    Patient Details  Name: Greer Koeppen MRN: [id number] Date of Birth: [birth year]  Transition of Care [facility]) CM/SW Contact  Joseph Art, [place] Phone Number: 08/31/2019, 4:44 PM  Clinical Narrative:     CSW spoke with patient.  Patient refusing to go to ALF "unless you can guarantee me a private room."  CSW informed patient we could not guarantee a private room and patient stated he would rather return home with home health.  Patient is active with [facility].   Expected Discharge Plan: Skilled Nursing Facility Barriers to Discharge: Continued Medical Work up  Expected Discharge Plan and Services Expected Discharge Plan: Skilled Nursing Facility In-house Referral: Clinical Social Work Discharge Planning Services: CM Consult   Living arrangements for the past 2 months: Apartment                 DME Arranged: Oxygen                     Social Determinants of Health (SDOH) Interventions    Readmission Risk Interventions Readmission Risk Prevention Plan 01/25/2019 10/25/2018 08/08/2018  Transportation Screening Complete Complete Complete  PCP or Specialist Appt within 3-5 Days - Complete Complete  HRI or Home Care Consult - Complete Complete  Social Work Consult for Recovery Care Planning/Counseling - Complete Patient refused  Palliative Care Screening - Not Applicable Not Applicable  Medication Review Oceanographer) Complete Complete Complete  PCP or Specialist appointment within 3-5 days of discharge Complete - -  HRI or Home Care Consult Complete - -  SW Recovery Care/Counseling Consult Complete - -  Palliative Care Screening Not Applicable - -  Skilled Nursing Facility Not Applicable - -

## 2019-08-31 NOTE — ED Notes (Signed)
Pt provided with milk per request at this time

## 2019-08-31 NOTE — ED Provider Notes (Signed)
-----------------------------------------   5:43 AM on 08/31/2019 -----------------------------------------  The patient has had an uneventful overnight and has been resting comfortably. He is awaiting placement for his alcohol abuse.   Loleta Rose, MD 08/31/19 [phone]

## 2019-09-01 ENCOUNTER — Emergency Department: Payer: Medicaid Other

## 2019-09-01 LAB — PROTIME-INR
INR: 2.5 — ABNORMAL HIGH (ref 0.8–1.2)
Prothrombin Time: 25.8 seconds — ABNORMAL HIGH (ref 11.4–15.2)

## 2019-09-01 MED ORDER — FUROSEMIDE 10 MG/ML IJ SOLN
40.0000 mg | Freq: Once | INTRAMUSCULAR | Status: AC
  Administered 2019-09-01: 40 mg via INTRAVENOUS
  Filled 2019-09-01: qty 4

## 2019-09-01 NOTE — ED Provider Notes (Signed)
Patient is now refusing SNF/ALF placement due to not being guaranteed a private room. He is requesting to go home. He is HDS and showing no signs of alcohol withdrawal clinically. He has home care assist already set up. Unfortunately, he does not qualify for HH/PT referral from ED per SW who has been talking with him extensively today.  Patient calm, in no distress on my assessment. He is on his baseline O2 and confirms he has this at home. He states he feels near baseline now, no withdrawal sx. He is requesting d/c. He is awake, alert, oriented. Demonstrates capacity. He does have some mild edema on CXR so will give an additional dose of lasix. He will be transported w/ EMS for his O2. Return precautions given.   Shaune Pollack, MD 09/01/19 [phone]

## 2019-09-01 NOTE — ED Notes (Signed)
Pt cleaned up and given bed bath with warm wipes and body spray. Pt given deodorant and assisted to clean self. Gown given to pt, blue shorts and black sleeveless shirt placed in belongings bag. Pt denies any further needs at this time.

## 2019-09-01 NOTE — ED Provider Notes (Signed)
8:07 AM  Blood pressure 127/76, pulse 97, temperature 99.3 F (37.4 C), temperature source Oral, resp. rate 16, height 5\' 8"  (1.727 m), weight (!) 189.1 kg, SpO2 97 %.  The patient is calm and cooperative at this time.  There have been no acute events since the last update.  Awaiting disposition plan from SW    , MD 09/01/19 [phone]

## 2019-09-01 NOTE — ED Notes (Signed)
Pt given grape juice per pt request.

## 2019-09-01 NOTE — Discharge Instructions (Addendum)
Continue your usual medications at home  Follow-up with your primary doctor on Monday to discuss placement

## 2019-09-01 NOTE — TOC Progression Note (Signed)
Transition of Care [facility]) - Progression Note    Patient Details  Name: Thailan Sava MRN: [id number] Date of Birth: 11/21/1960  Transition of Care [facility]) CM/SW Contact  Larwance Rote, LCSW Phone Number: 09/01/2019, 3:27 PM  Clinical Narrative:   Patient want to return to his home/apartment. Active with [facility].  Patient reported that he used oxygen at home 3L but does not remember the servicing agency's name. Does not have oxygen for transport home.   Pt reported that he he does not want to be discharged into an ALF.  Stated that he does not want to share room with another person.     Expected Discharge Plan: Skilled Nursing Facility Declined services.  Barriers to Discharge: Continued Medical Work up  Expected Discharge Plan and Services Expected Discharge Plan: Skilled Nursing Facility In-house Referral: Clinical Social Work Discharge Planning Services: CM Consult   Living arrangements for the past 2 months: Apartment                 DME Arranged: Oxygen                     Social Determinants of Health (SDOH) Interventions    Readmission Risk Interventions Readmission Risk Prevention Plan 01/25/2019 10/25/2018 08/08/2018  Transportation Screening Complete Complete Complete  PCP or Specialist Appt within 3-5 Days - Complete Complete  HRI or Home Care Consult - Complete Complete  Social Work Consult for Recovery Care Planning/Counseling - Complete Patient refused  Palliative Care Screening - Not Applicable Not Applicable  Medication Review Oceanographer) Complete Complete Complete  PCP or Specialist appointment within 3-5 days of discharge Complete - -  HRI or Home Care Consult Complete - -  SW Recovery Care/Counseling Consult Complete - -  Palliative Care Screening Not Applicable - -  Skilled Nursing Facility Not Applicable - -

## 2019-09-04 ENCOUNTER — Ambulatory Visit: Payer: Medicaid Other | Admitting: Adult Health

## 2019-09-06 ENCOUNTER — Ambulatory Visit: Payer: Self-pay | Admitting: *Deleted

## 2019-09-06 ENCOUNTER — Telehealth: Payer: Self-pay

## 2019-09-06 NOTE — Chronic Care Management (AMB) (Signed)
  Chronic Care Management   Social Work Note  09/06/2019 Name: [name] [name] MRN: [id number] DOB: 12/05/1960  [name] [name] is a 59 y.o. year old male who sees Patient, No Pcp Per for primary care. The CCM team was consulted for assistance with [name] .   Return phone call to the [facility]ability [facility]. Spoke with [name] [name] who confirmed that they continue to look for a wheelchair rack for patient for his car. This social worker explained that patient has been dismissed from [facility] and that he was made aware of need to find a new doctor.  SDOH (Social Determinants of Health) assessments performed: No     Outpatient Encounter Medications as of 09/06/2019  Medication Sig  . albuterol (VENTOLIN HFA) 108 (90 Base) MCG/ACT inhaler Inhale 2 puffs into the lungs every 6 (six) hours as needed for wheezing or shortness of breath.  . allopurinol (ZYLOPRIM) 100 MG tablet TAKE 1 TABLET BY MOUTH ONCE DAILY  . atorvastatin (LIPITOR) 40 MG tablet TAKE 1 TABLET BY MOUTH ONCE DAILY  . DULoxetine (CYMBALTA) 20 MG capsule Take 1 capsule (20 mg total) by mouth daily.  . ferrous sulfate 325 (65 FE) MG tablet Take 1 tablet (325 mg total) by mouth 2 (two) times daily with a meal.  . fluticasone (FLONASE) 50 MCG/ACT nasal spray Place 2 sprays into both nostrils 2 (two) times daily.  [name] gabapentin (NEURONTIN) 300 MG capsule TAKE 1 CAPSULE BY MOUTH 3 TIMES DAILY  . levothyroxine (SYNTHROID) 50 MCG tablet Take 1 tablet (50 mcg total) by mouth daily before breakfast. Need lab work beginning of June for Thyroid  . lisinopril (ZESTRIL) 2.5 MG tablet Take 1 tablet (2.5 mg total) by mouth daily.  [name] loperamide (IMODIUM) 2 MG capsule Take 1 capsule (2 mg total) by mouth as needed for diarrhea or loose stools.  . metoprolol succinate (TOPROL-XL) 50 MG 24 hr tablet Take 1 tablet (50 mg total) by mouth daily. Take with or immediately following a meal.  . naphazoline-glycerin (CLEAR EYES REDNESS)  0.012-0.2 % SOLN Place 1-2 drops into both eyes 4 (four) times daily as needed for eye irritation.  . nitroGLYCERIN (NITROSTAT) 0.4 MG SL tablet Place 1 tablet under tongue every 5 minutes as needed for chest pain. (No more than 3 doses within 15 minutes)  . oxyCODONE (OXY IR/ROXICODONE) 5 MG immediate release tablet Take 1 tablet (5 mg total) by mouth every 6 (six) hours as needed for moderate pain or severe pain. (Patient not taking: Reported on 08/24/2019)  . thiamine 100 MG tablet Take 1 tablet (100 mg total) by mouth daily.  [name] torsemide (DEMADEX) 20 MG tablet Take 1 tablet (20 mg total) by mouth 2 (two) times daily. (Patient taking differently: Take 60 mg by mouth 2 (two) times daily. )  . traZODone (DESYREL) 150 MG tablet TAKE 1 TABLET BY MOUTH AT BEDTIME AS NEEDED SLEEP  . warfarin (COUMADIN) 4 MG tablet Take 1 tablet (4 mg total) by mouth daily at 4 PM.   No facility-administered encounter medications on file as of 09/06/2019.    Goals Addressed   None       [name] Ey, LCSW Clinical Social Worker  [facility] Care Management [phone]

## 2019-09-06 NOTE — Telephone Encounter (Signed)
Copied from CRM [id number]. Topic: General - Other >> Sep 05, 2019  4:17 PM Gwenlyn Fudge wrote: Reason for CRM: Quay Burow, from disability advocates, called and is requesting to speak with social worker regarding pt. Please advise.

## 2019-09-11 ENCOUNTER — Telehealth (HOSPITAL_COMMUNITY): Payer: Self-pay

## 2019-09-11 ENCOUNTER — Ambulatory Visit: Payer: Medicaid Other

## 2019-09-11 ENCOUNTER — Ambulatory Visit: Payer: Self-pay | Admitting: *Deleted

## 2019-09-11 NOTE — Telephone Encounter (Signed)
Today had a telephone appt with Iantha Fallen.  He states doing ok, just still in a lot of pain.  Legs are being wrapped.  He is not weighing, does not watch his diet.  He sits in a chair all day with legs down.   He states is resting at night.  Social work has made him a [facility] per [place] for Aug 4.  He has all his medications and he states has been taking them.  He has home health coming and no PT, they stated he could not do PT.  He has social workers visiting him also.  He does not weigh because unsteady to step on scale.  He able to drive his [name] and get around with electric wheel chair.  Will check to see how PCP appt went and will try to make a home visit coming up.  Will visit for heart failure.   [name] [place] EMT-Paramedic [phone]

## 2019-09-11 NOTE — Chronic Care Management (AMB) (Signed)
  Chronic Care Management   Social Work Note  09/11/2019 Name: [name] [name] MRN: [id number] DOB: 25-Mar-1960  [name] [name] is a 59 y.o. year old male who sees Patient, No Pcp Per for primary care. The CCM team was consulted for assistance with [name] .   Phone call from [place], through the Disabilty Advocacy program who confirmed that she went to visit patient last week and he is now working on a [employer] with him focused on keeping his follow up appointments. APS continues to be involved. She has assisted patient with a provider appointment with [facility] with Aura Dials scheduled for 09/12/19.  SDOH (Social Determinants of Health) assessments performed: No     Outpatient Encounter Medications as of 09/11/2019  Medication Sig  . albuterol (VENTOLIN HFA) 108 (90 Base) MCG/ACT inhaler Inhale 2 puffs into the lungs every 6 (six) hours as needed for wheezing or shortness of breath.  . allopurinol (ZYLOPRIM) 100 MG tablet TAKE 1 TABLET BY MOUTH ONCE DAILY  . atorvastatin (LIPITOR) 40 MG tablet TAKE 1 TABLET BY MOUTH ONCE DAILY  . DULoxetine (CYMBALTA) 20 MG capsule Take 1 capsule (20 mg total) by mouth daily.  . ferrous sulfate 325 (65 FE) MG tablet Take 1 tablet (325 mg total) by mouth 2 (two) times daily with a meal.  . fluticasone (FLONASE) 50 MCG/ACT nasal spray Place 2 sprays into both nostrils 2 (two) times daily.  [name] gabapentin (NEURONTIN) 300 MG capsule TAKE 1 CAPSULE BY MOUTH 3 TIMES DAILY  . levothyroxine (SYNTHROID) 50 MCG tablet Take 1 tablet (50 mcg total) by mouth daily before breakfast. Need lab work beginning of June for Thyroid  . lisinopril (ZESTRIL) 2.5 MG tablet Take 1 tablet (2.5 mg total) by mouth daily.  [name] loperamide (IMODIUM) 2 MG capsule Take 1 capsule (2 mg total) by mouth as needed for diarrhea or loose stools.  . metoprolol succinate (TOPROL-XL) 50 MG 24 hr tablet Take 1 tablet (50 mg total) by mouth daily. Take with or  immediately following a meal.  . naphazoline-glycerin (CLEAR EYES REDNESS) 0.012-0.2 % SOLN Place 1-2 drops into both eyes 4 (four) times daily as needed for eye irritation.  . nitroGLYCERIN (NITROSTAT) 0.4 MG SL tablet Place 1 tablet under tongue every 5 minutes as needed for chest pain. (No more than 3 doses within 15 minutes)  . oxyCODONE (OXY IR/ROXICODONE) 5 MG immediate release tablet Take 1 tablet (5 mg total) by mouth every 6 (six) hours as needed for moderate pain or severe pain. (Patient not taking: Reported on 08/24/2019)  . thiamine 100 MG tablet Take 1 tablet (100 mg total) by mouth daily.  [name] torsemide (DEMADEX) 20 MG tablet Take 1 tablet (20 mg total) by mouth 2 (two) times daily. (Patient taking differently: Take 60 mg by mouth 2 (two) times daily. )  . traZODone (DESYREL) 150 MG tablet TAKE 1 TABLET BY MOUTH AT BEDTIME AS NEEDED SLEEP  . warfarin (COUMADIN) 4 MG tablet Take 1 tablet (4 mg total) by mouth daily at 4 PM.   No facility-administered encounter medications on file as of 09/11/2019.    Goals Addressed   None     Follow Up Plan: Patient will follow up with [facility] on 09/12/19   Verna Czech, LCSW Clinical Social Worker  [facility] Care Management [phone]

## 2019-09-12 ENCOUNTER — Other Ambulatory Visit: Payer: Medicaid Other

## 2019-09-13 ENCOUNTER — Ambulatory Visit: Payer: Medicaid Other | Admitting: Nurse Practitioner

## 2019-09-17 DIAGNOSIS — J9611 Chronic respiratory failure with hypoxia: Secondary | ICD-10-CM | POA: Diagnosis not present

## 2019-09-17 DIAGNOSIS — I110 Hypertensive heart disease with heart failure: Secondary | ICD-10-CM | POA: Diagnosis not present

## 2019-09-17 DIAGNOSIS — L97822 Non-pressure chronic ulcer of other part of left lower leg with fat layer exposed: Secondary | ICD-10-CM | POA: Diagnosis not present

## 2019-09-17 DIAGNOSIS — I890 Lymphedema, not elsewhere classified: Secondary | ICD-10-CM | POA: Diagnosis not present

## 2019-09-17 DIAGNOSIS — E785 Hyperlipidemia, unspecified: Secondary | ICD-10-CM | POA: Diagnosis not present

## 2019-09-17 DIAGNOSIS — J449 Chronic obstructive pulmonary disease, unspecified: Secondary | ICD-10-CM | POA: Diagnosis not present

## 2019-09-17 DIAGNOSIS — I2510 Atherosclerotic heart disease of native coronary artery without angina pectoris: Secondary | ICD-10-CM | POA: Diagnosis not present

## 2019-09-17 DIAGNOSIS — G894 Chronic pain syndrome: Secondary | ICD-10-CM | POA: Diagnosis not present

## 2019-09-17 DIAGNOSIS — E1151 Type 2 diabetes mellitus with diabetic peripheral angiopathy without gangrene: Secondary | ICD-10-CM | POA: Diagnosis not present

## 2019-09-17 DIAGNOSIS — D649 Anemia, unspecified: Secondary | ICD-10-CM | POA: Diagnosis not present

## 2019-09-17 DIAGNOSIS — I428 Other cardiomyopathies: Secondary | ICD-10-CM | POA: Diagnosis not present

## 2019-09-17 DIAGNOSIS — I872 Venous insufficiency (chronic) (peripheral): Secondary | ICD-10-CM | POA: Diagnosis not present

## 2019-09-17 DIAGNOSIS — M10041 Idiopathic gout, right hand: Secondary | ICD-10-CM | POA: Diagnosis not present

## 2019-09-17 DIAGNOSIS — M1611 Unilateral primary osteoarthritis, right hip: Secondary | ICD-10-CM | POA: Diagnosis not present

## 2019-09-17 DIAGNOSIS — L97812 Non-pressure chronic ulcer of other part of right lower leg with fat layer exposed: Secondary | ICD-10-CM | POA: Diagnosis not present

## 2019-09-17 DIAGNOSIS — G8194 Hemiplegia, unspecified affecting left nondominant side: Secondary | ICD-10-CM | POA: Diagnosis not present

## 2019-09-17 DIAGNOSIS — L03116 Cellulitis of left lower limb: Secondary | ICD-10-CM | POA: Diagnosis not present

## 2019-09-17 DIAGNOSIS — F321 Major depressive disorder, single episode, moderate: Secondary | ICD-10-CM | POA: Diagnosis not present

## 2019-09-17 DIAGNOSIS — M545 Low back pain: Secondary | ICD-10-CM | POA: Diagnosis not present

## 2019-09-17 DIAGNOSIS — G510 Bell's palsy: Secondary | ICD-10-CM | POA: Diagnosis not present

## 2019-09-17 DIAGNOSIS — G4733 Obstructive sleep apnea (adult) (pediatric): Secondary | ICD-10-CM | POA: Diagnosis not present

## 2019-09-17 DIAGNOSIS — I5042 Chronic combined systolic (congestive) and diastolic (congestive) heart failure: Secondary | ICD-10-CM | POA: Diagnosis not present

## 2019-09-17 DIAGNOSIS — L03115 Cellulitis of right lower limb: Secondary | ICD-10-CM | POA: Diagnosis not present

## 2019-09-17 DIAGNOSIS — I214 Non-ST elevation (NSTEMI) myocardial infarction: Secondary | ICD-10-CM | POA: Diagnosis not present

## 2019-09-17 DIAGNOSIS — I4819 Other persistent atrial fibrillation: Secondary | ICD-10-CM | POA: Diagnosis not present

## 2019-09-19 ENCOUNTER — Ambulatory Visit: Payer: Medicaid Other | Admitting: Family Medicine

## 2019-09-19 DIAGNOSIS — M1611 Unilateral primary osteoarthritis, right hip: Secondary | ICD-10-CM | POA: Diagnosis not present

## 2019-09-19 DIAGNOSIS — L97812 Non-pressure chronic ulcer of other part of right lower leg with fat layer exposed: Secondary | ICD-10-CM | POA: Diagnosis not present

## 2019-09-19 DIAGNOSIS — I214 Non-ST elevation (NSTEMI) myocardial infarction: Secondary | ICD-10-CM | POA: Diagnosis not present

## 2019-09-19 DIAGNOSIS — M10041 Idiopathic gout, right hand: Secondary | ICD-10-CM | POA: Diagnosis not present

## 2019-09-19 DIAGNOSIS — D649 Anemia, unspecified: Secondary | ICD-10-CM | POA: Diagnosis not present

## 2019-09-19 DIAGNOSIS — I110 Hypertensive heart disease with heart failure: Secondary | ICD-10-CM | POA: Diagnosis not present

## 2019-09-19 DIAGNOSIS — G4733 Obstructive sleep apnea (adult) (pediatric): Secondary | ICD-10-CM | POA: Diagnosis not present

## 2019-09-19 DIAGNOSIS — I2510 Atherosclerotic heart disease of native coronary artery without angina pectoris: Secondary | ICD-10-CM | POA: Diagnosis not present

## 2019-09-19 DIAGNOSIS — F321 Major depressive disorder, single episode, moderate: Secondary | ICD-10-CM | POA: Diagnosis not present

## 2019-09-19 DIAGNOSIS — E785 Hyperlipidemia, unspecified: Secondary | ICD-10-CM | POA: Diagnosis not present

## 2019-09-19 DIAGNOSIS — I4819 Other persistent atrial fibrillation: Secondary | ICD-10-CM | POA: Diagnosis not present

## 2019-09-19 DIAGNOSIS — G8194 Hemiplegia, unspecified affecting left nondominant side: Secondary | ICD-10-CM | POA: Diagnosis not present

## 2019-09-19 DIAGNOSIS — I5042 Chronic combined systolic (congestive) and diastolic (congestive) heart failure: Secondary | ICD-10-CM | POA: Diagnosis not present

## 2019-09-19 DIAGNOSIS — I890 Lymphedema, not elsewhere classified: Secondary | ICD-10-CM | POA: Diagnosis not present

## 2019-09-19 DIAGNOSIS — G510 Bell's palsy: Secondary | ICD-10-CM | POA: Diagnosis not present

## 2019-09-19 DIAGNOSIS — I872 Venous insufficiency (chronic) (peripheral): Secondary | ICD-10-CM | POA: Diagnosis not present

## 2019-09-19 DIAGNOSIS — G894 Chronic pain syndrome: Secondary | ICD-10-CM | POA: Diagnosis not present

## 2019-09-19 DIAGNOSIS — L03116 Cellulitis of left lower limb: Secondary | ICD-10-CM | POA: Diagnosis not present

## 2019-09-19 DIAGNOSIS — J9611 Chronic respiratory failure with hypoxia: Secondary | ICD-10-CM | POA: Diagnosis not present

## 2019-09-19 DIAGNOSIS — L97822 Non-pressure chronic ulcer of other part of left lower leg with fat layer exposed: Secondary | ICD-10-CM | POA: Diagnosis not present

## 2019-09-19 DIAGNOSIS — L03115 Cellulitis of right lower limb: Secondary | ICD-10-CM | POA: Diagnosis not present

## 2019-09-19 DIAGNOSIS — I428 Other cardiomyopathies: Secondary | ICD-10-CM | POA: Diagnosis not present

## 2019-09-19 DIAGNOSIS — M545 Low back pain: Secondary | ICD-10-CM | POA: Diagnosis not present

## 2019-09-19 DIAGNOSIS — J449 Chronic obstructive pulmonary disease, unspecified: Secondary | ICD-10-CM | POA: Diagnosis not present

## 2019-09-19 DIAGNOSIS — E1151 Type 2 diabetes mellitus with diabetic peripheral angiopathy without gangrene: Secondary | ICD-10-CM | POA: Diagnosis not present

## 2019-09-20 DIAGNOSIS — I4819 Other persistent atrial fibrillation: Secondary | ICD-10-CM | POA: Diagnosis not present

## 2019-09-20 DIAGNOSIS — E1151 Type 2 diabetes mellitus with diabetic peripheral angiopathy without gangrene: Secondary | ICD-10-CM | POA: Diagnosis not present

## 2019-09-20 DIAGNOSIS — L97822 Non-pressure chronic ulcer of other part of left lower leg with fat layer exposed: Secondary | ICD-10-CM | POA: Diagnosis not present

## 2019-09-20 DIAGNOSIS — L03116 Cellulitis of left lower limb: Secondary | ICD-10-CM | POA: Diagnosis not present

## 2019-09-20 DIAGNOSIS — F321 Major depressive disorder, single episode, moderate: Secondary | ICD-10-CM | POA: Diagnosis not present

## 2019-09-20 DIAGNOSIS — I872 Venous insufficiency (chronic) (peripheral): Secondary | ICD-10-CM | POA: Diagnosis not present

## 2019-09-20 DIAGNOSIS — I428 Other cardiomyopathies: Secondary | ICD-10-CM | POA: Diagnosis not present

## 2019-09-20 DIAGNOSIS — G894 Chronic pain syndrome: Secondary | ICD-10-CM | POA: Diagnosis not present

## 2019-09-20 DIAGNOSIS — I890 Lymphedema, not elsewhere classified: Secondary | ICD-10-CM | POA: Diagnosis not present

## 2019-09-20 DIAGNOSIS — J449 Chronic obstructive pulmonary disease, unspecified: Secondary | ICD-10-CM | POA: Diagnosis not present

## 2019-09-20 DIAGNOSIS — G4733 Obstructive sleep apnea (adult) (pediatric): Secondary | ICD-10-CM | POA: Diagnosis not present

## 2019-09-20 DIAGNOSIS — L03115 Cellulitis of right lower limb: Secondary | ICD-10-CM | POA: Diagnosis not present

## 2019-09-20 DIAGNOSIS — E785 Hyperlipidemia, unspecified: Secondary | ICD-10-CM | POA: Diagnosis not present

## 2019-09-20 DIAGNOSIS — J9611 Chronic respiratory failure with hypoxia: Secondary | ICD-10-CM | POA: Diagnosis not present

## 2019-09-20 DIAGNOSIS — L97812 Non-pressure chronic ulcer of other part of right lower leg with fat layer exposed: Secondary | ICD-10-CM | POA: Diagnosis not present

## 2019-09-20 DIAGNOSIS — M545 Low back pain: Secondary | ICD-10-CM | POA: Diagnosis not present

## 2019-09-20 DIAGNOSIS — I110 Hypertensive heart disease with heart failure: Secondary | ICD-10-CM | POA: Diagnosis not present

## 2019-09-20 DIAGNOSIS — G510 Bell's palsy: Secondary | ICD-10-CM | POA: Diagnosis not present

## 2019-09-20 DIAGNOSIS — I5042 Chronic combined systolic (congestive) and diastolic (congestive) heart failure: Secondary | ICD-10-CM | POA: Diagnosis not present

## 2019-09-20 DIAGNOSIS — I214 Non-ST elevation (NSTEMI) myocardial infarction: Secondary | ICD-10-CM | POA: Diagnosis not present

## 2019-09-20 DIAGNOSIS — G8194 Hemiplegia, unspecified affecting left nondominant side: Secondary | ICD-10-CM | POA: Diagnosis not present

## 2019-09-20 DIAGNOSIS — I2510 Atherosclerotic heart disease of native coronary artery without angina pectoris: Secondary | ICD-10-CM | POA: Diagnosis not present

## 2019-09-20 DIAGNOSIS — D649 Anemia, unspecified: Secondary | ICD-10-CM | POA: Diagnosis not present

## 2019-09-20 DIAGNOSIS — M10041 Idiopathic gout, right hand: Secondary | ICD-10-CM | POA: Diagnosis not present

## 2019-09-20 DIAGNOSIS — M1611 Unilateral primary osteoarthritis, right hip: Secondary | ICD-10-CM | POA: Diagnosis not present

## 2019-09-25 ENCOUNTER — Telehealth: Payer: Self-pay
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
## 2019-09-26 DIAGNOSIS — T300 Burn of unspecified body region, unspecified degree: Secondary | ICD-10-CM | POA: Diagnosis not present

## 2019-09-26 DIAGNOSIS — R110 Nausea: Secondary | ICD-10-CM | POA: Diagnosis not present

## 2019-09-26 DIAGNOSIS — L97812 Non-pressure chronic ulcer of other part of right lower leg with fat layer exposed: Secondary | ICD-10-CM | POA: Diagnosis not present

## 2019-09-26 DIAGNOSIS — M10041 Idiopathic gout, right hand: Secondary | ICD-10-CM | POA: Diagnosis not present

## 2019-09-26 DIAGNOSIS — L03116 Cellulitis of left lower limb: Secondary | ICD-10-CM | POA: Diagnosis not present

## 2019-09-26 DIAGNOSIS — E11622 Type 2 diabetes mellitus with other skin ulcer: Secondary | ICD-10-CM | POA: Diagnosis not present

## 2019-09-26 DIAGNOSIS — T2029XA Burn of second degree of multiple sites of head, face, and neck, initial encounter: Secondary | ICD-10-CM | POA: Diagnosis not present

## 2019-09-26 DIAGNOSIS — L97912 Non-pressure chronic ulcer of unspecified part of right lower leg with fat layer exposed: Secondary | ICD-10-CM | POA: Diagnosis not present

## 2019-09-26 DIAGNOSIS — E1151 Type 2 diabetes mellitus with diabetic peripheral angiopathy without gangrene: Secondary | ICD-10-CM | POA: Diagnosis not present

## 2019-09-26 DIAGNOSIS — E871 Hypo-osmolality and hyponatremia: Secondary | ICD-10-CM | POA: Diagnosis not present

## 2019-09-26 DIAGNOSIS — E039 Hypothyroidism, unspecified: Secondary | ICD-10-CM | POA: Diagnosis not present

## 2019-09-26 DIAGNOSIS — I110 Hypertensive heart disease with heart failure: Secondary | ICD-10-CM | POA: Diagnosis not present

## 2019-09-26 DIAGNOSIS — T22212A Burn of second degree of left forearm, initial encounter: Secondary | ICD-10-CM | POA: Diagnosis not present

## 2019-09-26 DIAGNOSIS — E876 Hypokalemia: Secondary | ICD-10-CM | POA: Diagnosis not present

## 2019-09-26 DIAGNOSIS — M109 Gout, unspecified: Secondary | ICD-10-CM | POA: Diagnosis not present

## 2019-09-26 DIAGNOSIS — R52 Pain, unspecified: Secondary | ICD-10-CM | POA: Diagnosis not present

## 2019-09-26 DIAGNOSIS — I428 Other cardiomyopathies: Secondary | ICD-10-CM | POA: Diagnosis not present

## 2019-09-26 DIAGNOSIS — R000 Tachycardia, unspecified: Secondary | ICD-10-CM | POA: Diagnosis not present

## 2019-09-26 DIAGNOSIS — I872 Venous insufficiency (chronic) (peripheral): Secondary | ICD-10-CM | POA: Diagnosis not present

## 2019-09-26 DIAGNOSIS — M1611 Unilateral primary osteoarthritis, right hip: Secondary | ICD-10-CM | POA: Diagnosis not present

## 2019-09-26 DIAGNOSIS — G510 Bell's palsy: Secondary | ICD-10-CM | POA: Diagnosis not present

## 2019-09-26 DIAGNOSIS — F321 Major depressive disorder, single episode, moderate: Secondary | ICD-10-CM | POA: Diagnosis not present

## 2019-09-26 DIAGNOSIS — G894 Chronic pain syndrome: Secondary | ICD-10-CM | POA: Diagnosis not present

## 2019-09-26 DIAGNOSIS — L03115 Cellulitis of right lower limb: Secondary | ICD-10-CM | POA: Diagnosis not present

## 2019-09-26 DIAGNOSIS — E785 Hyperlipidemia, unspecified: Secondary | ICD-10-CM | POA: Diagnosis not present

## 2019-09-26 DIAGNOSIS — I890 Lymphedema, not elsewhere classified: Secondary | ICD-10-CM | POA: Diagnosis not present

## 2019-09-26 DIAGNOSIS — F329 Major depressive disorder, single episode, unspecified: Secondary | ICD-10-CM | POA: Diagnosis not present

## 2019-09-26 DIAGNOSIS — M545 Low back pain: Secondary | ICD-10-CM | POA: Diagnosis not present

## 2019-09-26 DIAGNOSIS — I214 Non-ST elevation (NSTEMI) myocardial infarction: Secondary | ICD-10-CM | POA: Diagnosis not present

## 2019-09-26 DIAGNOSIS — D649 Anemia, unspecified: Secondary | ICD-10-CM | POA: Diagnosis not present

## 2019-09-26 DIAGNOSIS — J449 Chronic obstructive pulmonary disease, unspecified: Secondary | ICD-10-CM | POA: Diagnosis not present

## 2019-09-26 DIAGNOSIS — L97922 Non-pressure chronic ulcer of unspecified part of left lower leg with fat layer exposed: Secondary | ICD-10-CM | POA: Diagnosis not present

## 2019-09-26 DIAGNOSIS — I5042 Chronic combined systolic (congestive) and diastolic (congestive) heart failure: Secondary | ICD-10-CM | POA: Diagnosis not present

## 2019-09-26 DIAGNOSIS — J9611 Chronic respiratory failure with hypoxia: Secondary | ICD-10-CM | POA: Diagnosis not present

## 2019-09-26 DIAGNOSIS — Z6841 Body Mass Index (BMI) 40.0 and over, adult: Secondary | ICD-10-CM | POA: Diagnosis not present

## 2019-09-26 DIAGNOSIS — T310 Burns involving less than 10% of body surface: Secondary | ICD-10-CM | POA: Diagnosis not present

## 2019-09-26 DIAGNOSIS — I252 Old myocardial infarction: Secondary | ICD-10-CM | POA: Diagnosis not present

## 2019-09-26 DIAGNOSIS — G8194 Hemiplegia, unspecified affecting left nondominant side: Secondary | ICD-10-CM | POA: Diagnosis not present

## 2019-09-26 DIAGNOSIS — R001 Bradycardia, unspecified: Secondary | ICD-10-CM | POA: Diagnosis not present

## 2019-09-26 DIAGNOSIS — T2020XA Burn of second degree of head, face, and neck, unspecified site, initial encounter: Secondary | ICD-10-CM | POA: Diagnosis not present

## 2019-09-26 DIAGNOSIS — I482 Chronic atrial fibrillation, unspecified: Secondary | ICD-10-CM | POA: Diagnosis not present

## 2019-09-26 DIAGNOSIS — J9610 Chronic respiratory failure, unspecified whether with hypoxia or hypercapnia: Secondary | ICD-10-CM | POA: Diagnosis not present

## 2019-09-26 DIAGNOSIS — L97822 Non-pressure chronic ulcer of other part of left lower leg with fat layer exposed: Secondary | ICD-10-CM | POA: Diagnosis not present

## 2019-09-26 DIAGNOSIS — I4819 Other persistent atrial fibrillation: Secondary | ICD-10-CM | POA: Diagnosis not present

## 2019-09-26 DIAGNOSIS — I2510 Atherosclerotic heart disease of native coronary artery without angina pectoris: Secondary | ICD-10-CM | POA: Diagnosis not present

## 2019-09-26 DIAGNOSIS — G4733 Obstructive sleep apnea (adult) (pediatric): Secondary | ICD-10-CM | POA: Diagnosis not present

## 2019-09-27 DIAGNOSIS — Z9981 Dependence on supplemental oxygen: Secondary | ICD-10-CM | POA: Diagnosis not present

## 2019-09-27 DIAGNOSIS — J449 Chronic obstructive pulmonary disease, unspecified: Secondary | ICD-10-CM | POA: Diagnosis not present

## 2019-09-27 DIAGNOSIS — T2029XA Burn of second degree of multiple sites of head, face, and neck, initial encounter: Secondary | ICD-10-CM | POA: Diagnosis not present

## 2019-09-27 DIAGNOSIS — T22212A Burn of second degree of left forearm, initial encounter: Secondary | ICD-10-CM | POA: Diagnosis not present

## 2019-09-28 ENCOUNTER — Ambulatory Visit: Payer: Medicaid Other | Admitting: Physician Assistant

## 2019-09-28 DIAGNOSIS — J449 Chronic obstructive pulmonary disease, unspecified: Secondary | ICD-10-CM | POA: Diagnosis not present

## 2019-09-28 DIAGNOSIS — T22212A Burn of second degree of left forearm, initial encounter: Secondary | ICD-10-CM | POA: Diagnosis not present

## 2019-09-28 DIAGNOSIS — T2029XA Burn of second degree of multiple sites of head, face, and neck, initial encounter: Secondary | ICD-10-CM | POA: Diagnosis not present

## 2019-09-28 DIAGNOSIS — Z9981 Dependence on supplemental oxygen: Secondary | ICD-10-CM | POA: Diagnosis not present

## 2019-09-29 DIAGNOSIS — J449 Chronic obstructive pulmonary disease, unspecified: Secondary | ICD-10-CM | POA: Diagnosis not present

## 2019-09-29 DIAGNOSIS — I5042 Chronic combined systolic (congestive) and diastolic (congestive) heart failure: Secondary | ICD-10-CM | POA: Diagnosis not present

## 2019-09-29 DIAGNOSIS — T2029XA Burn of second degree of multiple sites of head, face, and neck, initial encounter: Secondary | ICD-10-CM | POA: Diagnosis not present

## 2019-09-29 DIAGNOSIS — T22212A Burn of second degree of left forearm, initial encounter: Secondary | ICD-10-CM | POA: Diagnosis not present

## 2019-09-30 DIAGNOSIS — T2029XA Burn of second degree of multiple sites of head, face, and neck, initial encounter: Secondary | ICD-10-CM | POA: Diagnosis not present

## 2019-09-30 DIAGNOSIS — T22212A Burn of second degree of left forearm, initial encounter: Secondary | ICD-10-CM | POA: Diagnosis not present

## 2019-09-30 DIAGNOSIS — E039 Hypothyroidism, unspecified: Secondary | ICD-10-CM | POA: Diagnosis not present

## 2019-09-30 DIAGNOSIS — L03114 Cellulitis of left upper limb: Secondary | ICD-10-CM | POA: Insufficient documentation

## 2019-09-30 DIAGNOSIS — J449 Chronic obstructive pulmonary disease, unspecified: Secondary | ICD-10-CM | POA: Diagnosis not present

## 2019-10-01 DIAGNOSIS — J449 Chronic obstructive pulmonary disease, unspecified: Secondary | ICD-10-CM | POA: Diagnosis not present

## 2019-10-01 DIAGNOSIS — I509 Heart failure, unspecified: Secondary | ICD-10-CM | POA: Diagnosis not present

## 2019-10-01 DIAGNOSIS — T2020XA Burn of second degree of head, face, and neck, unspecified site, initial encounter: Secondary | ICD-10-CM | POA: Diagnosis not present

## 2019-10-01 DIAGNOSIS — T22212A Burn of second degree of left forearm, initial encounter: Secondary | ICD-10-CM | POA: Diagnosis not present

## 2019-10-09 ENCOUNTER — Telehealth (HOSPITAL_COMMUNITY): Payer: Self-pay
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
## 2019-10-12 ENCOUNTER — Encounter: Payer: Self-pay | Admitting: Family Medicine

## 2019-10-12 ENCOUNTER — Ambulatory Visit: Payer: Medicaid Other | Admitting: Family Medicine

## 2019-10-12 ENCOUNTER — Other Ambulatory Visit: Payer: Self-pay

## 2019-10-12 ENCOUNTER — Ambulatory Visit: Payer: Self-pay | Admitting: Pharmacist

## 2019-10-12 VITALS — BP 95/63 | HR 81 | Temp 98.9°F | Resp 16 | Ht 68.0 in | Wt >= 6400 oz

## 2019-10-12 DIAGNOSIS — I5032 Chronic diastolic (congestive) heart failure: Secondary | ICD-10-CM

## 2019-10-12 DIAGNOSIS — E782 Mixed hyperlipidemia: Secondary | ICD-10-CM

## 2019-10-12 DIAGNOSIS — M1A49X0 Other secondary chronic gout, multiple sites, without tophus (tophi): Secondary | ICD-10-CM

## 2019-10-12 DIAGNOSIS — J432 Centrilobular emphysema: Secondary | ICD-10-CM | POA: Diagnosis not present

## 2019-10-12 DIAGNOSIS — E1169 Type 2 diabetes mellitus with other specified complication: Secondary | ICD-10-CM | POA: Diagnosis not present

## 2019-10-12 DIAGNOSIS — F3341 Major depressive disorder, recurrent, in partial remission: Secondary | ICD-10-CM

## 2019-10-12 DIAGNOSIS — G4733 Obstructive sleep apnea (adult) (pediatric): Secondary | ICD-10-CM

## 2019-10-12 DIAGNOSIS — Z86718 Personal history of other venous thrombosis and embolism: Secondary | ICD-10-CM

## 2019-10-12 DIAGNOSIS — J3089 Other allergic rhinitis: Secondary | ICD-10-CM

## 2019-10-12 DIAGNOSIS — Z9989 Dependence on other enabling machines and devices: Secondary | ICD-10-CM

## 2019-10-12 DIAGNOSIS — E039 Hypothyroidism, unspecified: Secondary | ICD-10-CM

## 2019-10-12 DIAGNOSIS — Z6841 Body Mass Index (BMI) 40.0 and over, adult: Secondary | ICD-10-CM

## 2019-10-12 DIAGNOSIS — F411 Generalized anxiety disorder: Secondary | ICD-10-CM | POA: Diagnosis not present

## 2019-10-12 DIAGNOSIS — D6869 Other thrombophilia: Secondary | ICD-10-CM

## 2019-10-12 DIAGNOSIS — I4819 Other persistent atrial fibrillation: Secondary | ICD-10-CM

## 2019-10-12 DIAGNOSIS — I2510 Atherosclerotic heart disease of native coronary artery without angina pectoris: Secondary | ICD-10-CM

## 2019-10-12 MED ORDER — MONTELUKAST SODIUM 10 MG PO TABS: 10.0000 mg | ORAL_TABLET | Freq: Every day | ORAL | 11 refills | Status: DC

## 2019-10-12 NOTE — Chronic Care Management (AMB) (Signed)
  Care Management   Follow Up Note   10/12/2019 Name: [name] [name] MRN: [id number] DOB: 11-11-1960  Referred by: [name], DO Reason for referral : Chronic Care Management (Initial Patient Outreach)   [name] [name] is a 59 y.o. year old male who is a primary care patient of [name], DO. The care management team was consulted for assistance with care management and care coordination needs.    Place coordination of care call to [facility].  Outreach to [employer] by phone today.  Review of patient status, including review of consultants reports, relevant laboratory and other test results, and collaboration with appropriate care team members and the patient's provider was performed as part of comprehensive patient evaluation and provision of chronic care management services.    SDOH (Social Determinants of Health) assessments performed: No See Care Plan activities for detailed interventions related to [facility])     Advanced Directives: See Care Plan and Vynca application for related entries.   Goals Addressed    .  PharmD - Medication Management        CARE PLAN ENTRY (see longitudinal plan of care for additional care plan information)   Current Barriers:  . Chronic Disease Management support, education, and care coordination needs related to T2DM, persistent atrial fibrillation, HFrEF, morbid obesity, COPD, depression, hypothyroidism and gout . Financial Barriers  Pharmacist Clinical Goal(s):  [name] Over the next 30 days, patient will work with CM Pharmacist and PCP to address needs related to medication adherence  Interventions: . Provider and Inter-disciplinary care team collaboration (see longitudinal plan of care) . Receive an urgent referral from PCP. Reports patient seen for initial visit today. During visit, PCP reviewed patient's pill packs from [facility] and found patient to have plenty of medication remaining in his PM pack, but AM  pack only had one day of medication remaining. . Place coordination of care call to Sam, [place] with [facility]. o Sam reports based on pharmacy record, patient ~25 days past due for refill of his pill packs.  o Reports patient has requested pharmacy NOT send refills of pill pack automatically, but instead wait for patient to call for each refill. o Sam states that he will call patient today to arrange for delivery of next set of packs . Follow up call to patient to discuss medication adherence o Patient states he he does not want to have his medications refilled until 9/1 for financial reasons.  o Counsel on importance of medication adherence and advise patient to discuss payment options with [facility] R[facility], but patient states "I like playing the odds".  - States that [facility] has offered to allow him to fill his medications and pay later, but patient would prefer to go without medications until 9/1 . Schedule appointment with patient to complete medication review  Patient Self Care Activities:  . Does not adhere to prescribed medication regimen  Initial goal documentation        Plan  Telephone follow up appointment with care management team member scheduled for: 9/8 at 10:45 am  SIGNATURE

## 2019-10-12 NOTE — Patient Instructions (Signed)
Thank you allowing the Care Management Team to be a part of your care! It was a pleasure speaking with you today!     Care Management Team    Alto Denver RN, MSN, CCM Nurse Care Coordinator  [phone]   Duanne Moron PharmD  Clinical Pharmacist  [phone]   [name] LCSW Clinical Social Worker [phone]   Visit Information  Goals Addressed              This Visit's Progress   .  COMPLETED: PharmD "I'm in pain and depressed" (pt-stated)        Current Barriers:  . Patient endorses significant depression, especially since the passing of his significant other in April, complicated by chronic pain.  . Saw Dr. Cherylann Ratel in pain clinic, but notes that he wasn't interested in the interventions recommended . He notes that he is taking duloxetine and doesn't believe it will provide any benefit for his mood; however, refill history suggests he may not be taking this  Pharmacist Clinical Goal(s):  [name] Over the next 90 days, patient will work with CCM team and primary care provider to address needs related to management of chronic pain and depression  Interventions: . Encouraged patient to give duloxetine more of a chance to determine full benefit. Will continue to collaborate with [name], LCSW for mood support.   Patient Self Care Activities:  . Self administers medications as prescribed  Please see past updates related to this goal by clicking on the "Past Updates" button in the selected goal      .  COMPLETED: PharmD "I'm on a lot of medications" (pt-stated)        Current Barriers:  [name] Knowledge Deficits related to optimal medication management o Literacy Barriers - is able to spell out medication names to me and instructions over the phone, but reads slowly  o Medication Management - considering pill packing in the future; however, patient notes he can't afford to fill all of his medications at once.  o Financial Barriers - Continues to report that he's  having a hard time affording medications, food, etc since he received more aid/had lower copays in [place]. Patient notes he called and left a message with his social security case worker. [name] Appointment with Dr. Okey Dupre, cardiology, on 11/01/2018. Patient notes he continues to self-adjust and self-stop medications to save money. Previously recommended that he ask [facility] for payment programs or if they could waive copayments, but patient declined to ask these questions.   Pharmacist Clinical Goal(s):  [name] Over the next 90 days, patient will work with CCM team to address needs related to optimized medication management  Interventions: . Discussed pill packing with [facility] again. Patient declines, noting that he can handle managing his medications. Will continue to try to reinforce medication adherence and making medication management a priority  . Collaborated with [name], LCSW. Provided patient direct number for Social Security office in [place] to contact to follow up on disability application.   [name] Updated 1/18: Patient feels medications are too expensive; PharmD notes he is on Medicaid; reviewed medication list for any superfluous medications, will follow up with patient 1/21  Patient Self Care Activities:  . Calls pharmacy for medication refills . Calls provider office for new concerns or questions  Please see past updates related to this goal by clicking on the "Past Updates" button in the selected goal      .  PharmD - Medication Management  CARE PLAN ENTRY (see longitudinal plan of care for additional care plan information)   Current Barriers:  . Chronic Disease Management support, education, and care coordination needs related to T2DM, persistent atrial fibrillation, HFrEF, morbid obesity, COPD, depression, hypothyroidism and gout . Financial Barriers  Pharmacist Clinical Goal(s):  [name] Over the next 30 days, patient will work with CM Pharmacist and PCP to address  needs related to medication adherence  Interventions: . Provider and Inter-disciplinary care team collaboration (see longitudinal plan of care) . Receive an urgent referral from PCP. Reports patient seen for initial visit today. During visit, PCP reviewed patient's pill packs from [facility] and found patient to have plenty of medication remaining in his PM pack, but AM pack only had one day of medication remaining. . Place coordination of care call to Sam, [place] with [facility]. o Sam reports based on pharmacy record, patient ~25 days past due for refill of his pill packs.  o Reports patient has requested pharmacy NOT send refills of pill pack automatically, but instead wait for patient to call for each refill. o Sam states that he will call patient today to arrange for delivery of next set of packs . Follow up call to patient to discuss medication adherence o Patient states he he does not want to have his medications refilled until 9/1 for financial reasons.  o Counsel on importance of medication adherence and advise patient to discuss payment options with [facility], but patient states "I like playing the odds".  - States that [facility] has offered to allow him to fill his medications and pay later, but patient would prefer to go without medications until 9/1 . Schedule appointment with patient to complete medication review  Patient Self Care Activities:  . Does not adhere to prescribed medication regimen  Initial goal documentation        Patient verbalizes understanding of instructions provided today.   Telephone follow up appointment with care management team member scheduled for: 9/8 at 10:45 am  Duanne Moron, PharmD, [facility] Clinical Pharmacist [facility] [phone]

## 2019-10-12 NOTE — Patient Instructions (Addendum)
Thank you for coming to the office today.  Try to get medicaid switched for home assistance.  We will refer you to our Chronic Care Management Team on phone - Pam and Gentry Fitz and [name] - they will help coordinate.  I will increase the Gabapentin to dose of 600 3 times a day. - it may be a 600mg  pill or we can do DOUBLE the 300mg  pills.  We will talk to [facility]   Please schedule a Follow-up Appointment to: Return in about 4 weeks (around 11/09/2019) for 4 week follow-up med review.  If you have any other questions or concerns, please feel free to call the office or send a message through MyChart. You may also schedule an earlier appointment if necessary.  Additionally, you may be receiving a survey about your experience at our office within a few days to 1 week by e-mail or mail. We value your feedback.  , DO [facility], Saralyn Pilar

## 2019-10-12 NOTE — Progress Notes (Addendum)
Subjective:    Patient ID: [name] [name], male    DOB: [birth year], 59 y.o.   MRN: [id number]  Refoel Palladino is a 60 y.o. male presenting on 10/12/2019 for Establish Care (HTN)  Previous PCP at [facility] and then [facility]. Has been discharged from prior practices due to medical non-adherence.  HPI   Chronic Atrial Fibrillation, on Anticoagulation CAD Chronic Diastolic CHF / preserved EF History of MI vs CVA History of 100 degree weather, passed out years ago was told may have had TIA vs MI  OSA, on CPAP Feeling Great [employer] company - Patient reports prior history of dx OSA and on CPAP years, prior to treatment initial symptoms were snoring, daytime sleepiness and fatigue, has had several sleep studies in the past - Today reports that sleep apnea is well controlled. He uses the CPAP machine every night. Tolerates the machine well, and thinks that sleeps better with it and feels good. No new concerns or symptoms.   Mobird Obesity BMI >62 Major Depression, moderate, chronic recurrent - partial remission Generalized Anxiety Disorder (GAD) Insomnia  Stressor - Wife passed away unexpected 1 year ago, due to seizure and fell hit her head, aneurysm. Powerwheelchair Needs updated Medicaid card, to resume home health aide Sister in law comes by weekly to help with house work and groceries, cleaning  Chronic Pain Osteoarthritis Lumbar Disc Shoulder problem  CHRONIC DM, Type 2: Reports had been diet controlled vs pre DM in past Meds: no medicines Denies hypoglycemia, polyuria, visual changes, numbness or tingling.  Urinary Incontinence, Mixed Chronic problem with difficulty holding urine and having leakage, mixed type of incontinence. Needs re order on chux pads  Lower Extremity Edema / Venous Stasis [facility] 9/14 Needs assistance with compression stockings  Rx Pill pack per [facility], he usually will get new meds first of the month,  sometimes run out or limited due to financial  Cards Torsemide 20 BID Atorvastatin 40mg  Metoprolol XL 50mg  daily  PCP Trazodone 150mg  nightly Gabapentin 300mg  TID - asked if could be ordered high dose 600 TID, previously but never received order Ferosulfate 325mg  BID Lisinopril 2.5mg  daily Levothyroxine 50mg  daily Allopurinol 100mg  daily Thiamine 100mg  daily Duloxetine 20mg  daily  Has inhalers, Flonase, Eye Drops, and Warfarin at home.   Depression screen [facility] 2/9 10/12/2019 06/21/2019 05/10/2019  Decreased Interest 0 3 0  Down, Depressed, Hopeless 2 3 3   PHQ - 2 Score 2 6 3   Altered sleeping 0 1 1  Tired, decreased energy 3 1 3   Change in appetite 0 1 1  Feeling bad or failure about yourself  0 1 1  Trouble concentrating 0 1 1  Moving slowly or fidgety/restless 0 1 0  Suicidal thoughts 0 1 0  PHQ-9 Score 5 13 10   Difficult doing work/chores Very difficult Very difficult Very difficult  Some recent data might be hidden   GAD 7 : Generalized Anxiety Score 10/12/2019  Nervous, Anxious, on Edge 2  Control/stop worrying 2  Worry too much - different things 2  Trouble relaxing 2  Restless 3  Easily annoyed or irritable 2  Afraid - awful might happen 0  Total GAD 7 Score 13  Anxiety Difficulty Not difficult at all     Past Medical History:  Diagnosis Date  . Allergy   . Anxiety   . Arthritis   . Asthma   . Brain damage   . Chronic pain of both knees 07/13/2018  . Clotting  disorder (HCC)   . COPD (chronic obstructive pulmonary disease) (HCC)   . Depression   . GERD (gastroesophageal reflux disease)   . HFrEF (heart failure with reduced ejection fraction) (HCC)    a. 03/2018 Echo: EF 25-30%, diff HK. Mod LAE.  [name] History of DVT (deep vein thrombosis)   . History of pulmonary embolism    a. Chronic coumadin.  [name] Hypertension   . MI (myocardial infarction) (HCC)   . Morbid obesity (HCC)   . Neck pain 07/13/2018  . NICM (nonischemic cardiomyopathy) (HCC)    a. s/p Cath  x 3 - reportedly nl cors. Last cath 2019 in [place]; b. a. 03/2018 Echo: EF 25-30%, diff HK.  [name] Persistent atrial fibrillation (HCC)    a. 03/2018 s/p DCCV; b. CHA2DS2VASc = 1-->Xarelto (later changed to warfarin); c. 05/2018 recurrent afib-->Amio initiated.  . Sleep apnea   . Sleep apnea    Past Surgical History:  Procedure Laterality Date  . CARDIOVERSION N/A 03/24/2018   Procedure: CARDIOVERSION;  Surgeon: Antonieta Iba, MD;  Location: [facility] ORS;  Service: Cardiovascular;  Laterality: N/A;  . CARDIOVERSION N/A 08/08/2018   Procedure: CARDIOVERSION;  Surgeon: [name], MD;  Location: [facility] ORS;  Service: Cardiovascular;  Laterality: N/A;  . hearnia repair     X 3- total of two surgeries  . HERNIA REPAIR    . LEG SURGERY     Social History   Socioeconomic History  . Marital status: Single    Spouse name: Not on file  . Number of children: Not on file  . Years of education: Not on file  . Highest education level: Not on file  Occupational History  . Not on file  Tobacco Use  . Smoking status: Never Smoker  . Smokeless tobacco: Never Used  Vaping Use  . Vaping Use: Never used  Substance and Sexual Activity  . Alcohol use: Yes  . Drug use: Never  . Sexual activity: Not Currently  Other Topics Concern  . Not on file  Social History Narrative  . Not on file   Social Determinants of Health   Financial Resource Strain: Medium Risk  . Difficulty of Paying Living Expenses: Somewhat hard  Food Insecurity: No Food Insecurity  . Worried About Programme researcher, broadcasting/film/video in the Last Year: Never true  . Ran Out of Food in the Last Year: Never true  Transportation Needs: No Transportation Needs  . Lack of Transportation (Medical): No  . Lack of Transportation (Non-Medical): No  Physical Activity: Inactive  . Days of Exercise per Week: 0 days  . Minutes of Exercise per Session: 0 min  Stress: Stress Concern Present  . Feeling of Stress : Rather much  Social Connections: Socially  Isolated  . Frequency of Communication with Friends and Family: Once a week  . Frequency of Social Gatherings with Friends and Family: Once a week  . Attends Religious Services: Never  . Active Member of Clubs or Organizations: No  . Attends Banker Meetings: Never  . Marital Status: Widowed  Intimate Partner Violence:   . Fear of Current or Ex-Partner: Not on file  . Emotionally Abused: Not on file  . Physically Abused: Not on file  . Sexually Abused: Not on file   Family History  Problem Relation Age of Onset  . Heart failure Mother   . Lung cancer Mother   . Lung cancer Father   . Heart attack Maternal Grandmother   . Heart attack Maternal  Grandfather    Current Outpatient Medications on File Prior to Visit  Medication Sig  . albuterol (VENTOLIN HFA) 108 (90 Base) MCG/ACT inhaler Inhale 2 puffs into the lungs every 6 (six) hours as needed for wheezing or shortness of breath.  . allopurinol (ZYLOPRIM) 100 MG tablet TAKE 1 TABLET BY MOUTH ONCE DAILY  . atorvastatin (LIPITOR) 40 MG tablet TAKE 1 TABLET BY MOUTH ONCE DAILY  . DULoxetine (CYMBALTA) 20 MG capsule Take 1 capsule (20 mg total) by mouth daily.  . ferrous sulfate 325 (65 FE) MG tablet Take 1 tablet (325 mg total) by mouth 2 (two) times daily with a meal.  . fluticasone (FLONASE) 50 MCG/ACT nasal spray Place 2 sprays into both nostrils 2 (two) times daily.  [name] gabapentin (NEURONTIN) 300 MG capsule TAKE 1 CAPSULE BY MOUTH 3 TIMES DAILY  . levothyroxine (SYNTHROID) 50 MCG tablet Take 1 tablet (50 mcg total) by mouth daily before breakfast. Need lab work beginning of June for Thyroid  . lisinopril (ZESTRIL) 2.5 MG tablet Take 1 tablet (2.5 mg total) by mouth daily.  [name] loperamide (IMODIUM) 2 MG capsule Take 1 capsule (2 mg total) by mouth as needed for diarrhea or loose stools.  . metoprolol succinate (TOPROL-XL) 50 MG 24 hr tablet Take 1 tablet (50 mg total) by mouth daily. Take with or immediately following a meal.   . naphazoline-glycerin (CLEAR EYES REDNESS) 0.012-0.2 % SOLN Place 1-2 drops into both eyes 4 (four) times daily as needed for eye irritation.  . nitroGLYCERIN (NITROSTAT) 0.4 MG SL tablet Place 1 tablet under tongue every 5 minutes as needed for chest pain. (No more than 3 doses within 15 minutes)  . oxyCODONE (OXY IR/ROXICODONE) 5 MG immediate release tablet Take 1 tablet (5 mg total) by mouth every 6 (six) hours as needed for moderate pain or severe pain.  [name] thiamine 100 MG tablet Take 1 tablet (100 mg total) by mouth daily.  [name] torsemide (DEMADEX) 20 MG tablet Take 1 tablet (20 mg total) by mouth 2 (two) times daily. (Patient taking differently: Take 60 mg by mouth 2 (two) times daily. )  . traZODone (DESYREL) 150 MG tablet TAKE 1 TABLET BY MOUTH AT BEDTIME AS NEEDED SLEEP  . warfarin (COUMADIN) 4 MG tablet Take 1 tablet (4 mg total) by mouth daily at 4 PM.   No current facility-administered medications on file prior to visit.    Review of Systems Per HPI unless specifically indicated above      Objective:    BP 95/63   Pulse 81   Temp 98.9 F (37.2 C) (Temporal)   Resp 16   Ht  (1.727 m)   Wt (!) 410 lb (186 kg)   SpO2 96%   BMI 62.34 kg/m   Wt Readings from Last 3 Encounters:  10/12/19 (!) 410 lb (186 kg)  08/30/19 (!) 416 lb 14.2 oz (189.1 kg)  08/24/19 (!) 417 lb (189.1 kg)    Physical Exam Vitals and nursing note reviewed.  Constitutional:      General: He is not in acute distress.    Appearance: He is well-developed. He is obese. He is not diaphoretic.     Comments: Well-appearing, comfortable, cooperative  HENT:     Head: Normocephalic and atraumatic.  Eyes:     General:        Right eye: No discharge.        Left eye: No discharge.     Conjunctiva/sclera: Conjunctivae normal.  Neck:  Thyroid: No thyromegaly.  Cardiovascular:     Rate and Rhythm: Normal rate and regular rhythm.     Heart sounds: Normal heart sounds. No murmur heard.   Pulmonary:      Effort: Pulmonary effort is normal. No respiratory distress.     Breath sounds: Normal breath sounds. No wheezing or rales.  Musculoskeletal:     Cervical back: Normal range of motion and neck supple.     Right lower leg: Edema present.     Left lower leg: Edema present.     Comments: In power wheelchair  Lymphadenopathy:     Cervical: No cervical adenopathy.  Skin:    General: Skin is warm and dry.     Findings: No erythema or rash.  Neurological:     Mental Status: He is alert and oriented to person, place, and time.  Psychiatric:        Behavior: Behavior normal.     Comments: Well groomed, good eye contact, normal speech and thoughts        Results for orders placed or performed during the hospital encounter of 08/30/19  CBC with Differential  Result Value Ref Range   WBC 6.4 4.0 - 10.5 K/uL   RBC 4.39 4.22 - 5.81 MIL/uL   Hemoglobin 12.8 (L) 13.0 - 17.0 g/dL   HCT 20.8 39 - 52 %   MCV 89.7 80.0 - 100.0 fL   MCH 29.2 26.0 - 34.0 pg   MCHC 32.5 30.0 - 36.0 g/dL   RDW 02.2 (H) 33.6 - 12.2 %   Platelets 283 150 - 400 K/uL   nRBC 0.0 0.0 - 0.2 %   Neutrophils Relative % 59 %   Neutro Abs 3.8 1.7 - 7.7 K/uL   Lymphocytes Relative 24 %   Lymphs Abs 1.5 0.7 - 4.0 K/uL   Monocytes Relative 13 %   Monocytes Absolute 0.8 0 - 1 K/uL   Eosinophils Relative 3 %   Eosinophils Absolute 0.2 0 - 0 K/uL   Basophils Relative 1 %   Basophils Absolute 0.1 0 - 0 K/uL   Immature Granulocytes 0 %   Abs Immature Granulocytes 0.01 0.00 - 0.07 K/uL  Comprehensive metabolic panel  Result Value Ref Range   Sodium 137 135 - 145 mmol/L   Potassium 3.7 3.5 - 5.1 mmol/L   Chloride 97 (L) 98 - 111 mmol/L   CO2 25 22 - 32 mmol/L   Glucose, Bld 158 (H) 70 - 99 mg/dL   BUN 5 (L) 6 - 20 mg/dL   Creatinine, Ser 4.49 0.61 - 1.24 mg/dL   Calcium 8.4 (L) 8.9 - 10.3 mg/dL   Total Protein 7.3 6.5 - 8.1 g/dL   Albumin 3.6 3.5 - 5.0 g/dL   AST 37 15 - 41 U/L   ALT 42 0 - 44 U/L   Alkaline  Phosphatase 73 38 - 126 U/L   Total Bilirubin 0.8 0.3 - 1.2 mg/dL   GFR calc non Af Amer >60 >60 mL/min   GFR calc Af Amer >60 >60 mL/min   Anion gap 15 5 - 15  Lipase, blood  Result Value Ref Range   Lipase 46 11 - 51 U/L  Urinalysis, Complete w Microscopic  Result Value Ref Range   Color, Urine YELLOW (A) YELLOW   APPearance HAZY (A) CLEAR   Specific Gravity, Urine 1.014 1.005 - 1.030   pH 7.0 5.0 - 8.0   Glucose, UA NEGATIVE NEGATIVE mg/dL   Hgb urine dipstick  MODERATE (A) NEGATIVE   Bilirubin Urine NEGATIVE NEGATIVE   Ketones, ur NEGATIVE NEGATIVE mg/dL   Protein, ur 30 (A) NEGATIVE mg/dL   Nitrite NEGATIVE NEGATIVE   Leukocytes,Ua SMALL (A) NEGATIVE   RBC / HPF 6-10 0 - 5 RBC/hpf   WBC, UA 6-10 0 - 5 WBC/hpf   Bacteria, UA NONE SEEN NONE SEEN   Squamous Epithelial / LPF 0-5 0 - 5   Mucus PRESENT   Ethanol  Result Value Ref Range   Alcohol, Ethyl (B) 45 (H) <10 mg/dL  Procalcitonin  Result Value Ref Range   Procalcitonin <0.10 ng/mL  Protime-INR  Result Value Ref Range   Prothrombin Time 25.9 (H) 11.4 - 15.2 seconds   INR 2.5 (H) 0.8 - 1.2  Protime-INR  Result Value Ref Range   Prothrombin Time 25.8 (H) 11.4 - 15.2 seconds   INR 2.5 (H) 0.8 - 1.2  Troponin I (High Sensitivity)  Result Value Ref Range   Troponin I (High Sensitivity) 36 (H) <18 ng/L      Assessment & Plan:   Problem List Items Addressed This Visit    Type 2 diabetes mellitus with other specified complication (HCC)   Relevant Orders   Ambulatory referral to Chronic Care Management Services   Personal history of DVT (deep vein thrombosis)   Persistent atrial fibrillation (HCC)   OSA on CPAP   Morbid obesity with BMI of 60.0-69.9, adult (HCC) - Primary   Relevant Orders   Ambulatory referral to Chronic Care Management Services   Major depressive disorder, recurrent episode, in partial remission (HCC)   Relevant Orders   Ambulatory referral to Chronic Care Management Services   Hypothyroidism    HLD (hyperlipidemia)   Gout   GAD (generalized anxiety disorder)   Chronic diastolic CHF (congestive heart failure) (HCC)   Relevant Orders   Ambulatory referral to Chronic Care Management Services   Centrilobular emphysema (HCC)   Relevant Medications   montelukast (SINGULAIR) 10 MG tablet   Other Relevant Orders   Ambulatory referral to Chronic Care Management Services   CAD (coronary artery disease)   Acquired thrombophilia (HCC)    Other Visit Diagnoses    Seasonal allergic rhinitis due to other allergic trigger       Relevant Medications   montelukast (SINGULAIR) 10 MG tablet      #Cardiology Diastolic CHF preserved EF / CAD Persistent Atrial Fibrillation, Anticoagulation on Warfarin (Acquired Thrombophilia, prior DVT) HTN Followed by [facility] Cardiology On anticoagulation warfarin On low dose ACEi, Metoprolol  #OSA on CPAP Well controlled, chronic OSA on CPAP - Good adherence to CPAP nightly - Continue current CPAP therapy, patient seems to be benefiting from therapy  #Type 2 Diabetes Previously controlled A1c 5.8, about 3 months ago.  #Depression, Anxiety On Trazodone 150 nightly, asking about dose adjust, advised would limit any dose increase at this time. On Duloxetine 20mg  daily  #Chronic Pain Previously followed by [facility], he may return.  #Allergies Ordered Singulair nightly  Med Rec - today goal was to complete med rec and determine his needs, has only PM pill pack, the AM pack has 1 day left, he says he will fill on 9/1, already called [facility] to discuss and she will coordinate with [facility].  #Urinary Incontinence Chronic problem. Has morbid obesity and diabetes as well as spinal lumbar degenerative disc disease problem impacting his nerve function. He benefits from disposable underpads/chux due to urinary leakage, often he is limited  to sedentary seated in power mobility device. Unable to get to bathroom due  to mobility.   Orders Placed This Encounter  Procedures  . Ambulatory referral to Chronic Care Management Services    Referral Priority:   Routine    Referral Type:   Consultation    Referral Reason:   Care Coordination    Number of Visits Requested:   1     Meds ordered this encounter  Medications  . montelukast (SINGULAIR) 10 MG tablet    Sig: Take 1 tablet (10 mg total) by mouth at bedtime.    Dispense:  30 tablet    Refill:  11    Pill pack     Follow up plan: Return in about 4 weeks (around 11/09/2019) for 4 week follow-up med review.  Saralyn Pilar, DO [facility] 10/12/2019, 10:43 AM

## 2019-10-15 ENCOUNTER — Ambulatory Visit: Payer: Self-pay | Admitting: General Practice

## 2019-10-15 DIAGNOSIS — W19XXXD Unspecified fall, subsequent encounter: Secondary | ICD-10-CM

## 2019-10-15 DIAGNOSIS — I5032 Chronic diastolic (congestive) heart failure: Secondary | ICD-10-CM

## 2019-10-15 NOTE — Patient Instructions (Signed)
Visit Information  Goals Addressed              This Visit's Progress   .  COMPLETED: RN-I need help managing my overall health (pt-stated)        Current Barriers: Completed. The patient is new to this practice and RNCM. Starting with a new goal . Lacks caregiver support.  . Corporate treasurer.  . Literacy barriers . Transportation barriers . Difficulty obtaining medications . Cognitive Deficits . Chronic Disease Management support and education needs related to managing multiple chronic diseases as evidenced by multiple hospitalizations in the last 6 months.  Nurse Case Manager Clinical Goal(s):  [name] Over the next 90 days, patient will not experience hospital admission. Hospital Admissions in last 6 months = 5  Interventions:  . Discussed plans with patient for ongoing care management follow up and provided patient with direct contact information for care management team  . Patient remains admitted to the hospital for wound infection  to bilateral lower extremities.  Patient stated he is getting antibiotics and dressing changes . Patient stated he plans to see vein and vascular and ortho when he leaves the hospital also. . Patient stated he plan to see new PCP 12/15 at [facility].  . Patient in good spirits but does say his legs have been in a lot of pain.  . Patient states he does have transportation for when he is discharged from the hospital  Patient Self Care Activities:  . Currently UNABLE TO independently manage multiple chronic disease processes as evidenced by 5 hospital admits in the last 6 mos  Please see past updates related to this goal by clicking on the "Past Updates" button in the selected goal  Previous goal of weight loss d/c'd related to patient's new focus      .  RNCM: Pt-"I don't feel safe enough to weigh" (pt-stated)        CARE PLAN ENTRY (see longitudinal plan of care for additional care plan information)  Current Barriers:   [name] Knowledge deficit related to basic heart failure pathophysiology and self care management . Lack of scale in home- has a scale but does not feel safe enough to weigh due to balance issues . Financial strain . Lack of caregiver support . History of non-compliance . Transportation barriers  Nurse Case Manager Clinical Goal(s):   Over the next 120 days, patient will weigh self daily and record- when safe to do so  Over the next 120 days, patient will verbalize understanding of Heart Failure Action Plan and when to call doctor  Over the next 120 days, patient will take all Heart Failure mediations as prescribed  Interventions:  . Basic overview and discussion of pathophysiology of Heart Failure . Provided written and verbal education on low sodium diet. Review of heart healthy/ADA diet. The patient endorses compliance. Had to cut the call short, will explore further at next outreach . Reviewed Heart Failure Action Plan in depth and provided written copy . Assessed for scales in home . Discussed importance of daily weight.  The patient has a scale at home but does not feel safe to weigh on the scale. The patients weight in the office at first visit with pcp was 410 pounds. . Reviewed role of diuretics in prevention of fluid overload . Review of fluid/water weight versus body weight. The patient states he does have fluctuations in his weights but does not know how much because he is concerned about safety.  . Evaluation  of upcoming appointments. The patient has an appointment to see the cardiologist tomorrow at [facility] pending adequate transportation. The patient also has follow up with pcp on 11-13-2019.   Patient Self Care Activities:  . Take Heart Failure Medications as prescribed . Weigh daily and record (notify MD with 3 lb weight gain over night or 5 lb in a week)- when safe to do so . Follow CHF Action Plan . Adhere to low sodium diet  Initial goal documentation     .   RNCM: Pt-"I had a fall three nights ago" (pt-stated)        CARE PLAN ENTRY (see longitudinal plan of care for additional care plan information)  Current Barriers:  [name] Knowledge Deficits related to fall precautions in patient with  . Decreased adherence to prescribed treatment for fall prevention . Lacks caregiver support.  . Transportation barriers . Non-adherence to scheduled provider appointments . Non-adherence to prescribed medication regimen  Clinical Goal(s):  [name] Over the next 120 days, patient will demonstrate improved adherence to prescribed treatment plan for decreasing falls as evidenced by patient reporting and review of EMR . Over the next 120 days, patient will verbalize using fall risk reduction strategies discussed . Over the next 120 days, patient will not experience additional falls . Over the next 120 days, patient will verbalize understanding of plan for reducing fall events and remaining safe in his home environment.   Interventions:  . Provided written and verbal education re: Potential causes of falls and Fall prevention strategies . Reviewed medications and discussed potential side effects of medications such as dizziness and frequent urination . Assessed for s/s of orthostatic hypotension . Assessed for falls since last encounter. Patient had a fall "three nights ago" Friday night on 10-12-2019. Denies any head injuries. Larey Seat out of his "office chair", was on the floor for a while and made it on the floor to his bed and was able to pull himself up off of the floor. Denies any injuries.  . Assessed patients knowledge of fall risk prevention secondary to previously provided education. . Assessed working status of life alert bracelet and patient adherence- unable to assess this call. Will assess on next outreach. The patient had to take an incoming call concerning transportation.  . Provided patient information for fall alert systems . Reviewed scheduled/upcoming provider  appointments including: appointment with cardiology on 10-16-2019 pending transportation and follow up with pcp on 11-13-2019  Patient Self Care Activities:  . Utilize cane/walker (assistive device) appropriately with all ambulation . De-clutter walkways . Change positions slowly . Wear secure fitting shoes at all times with ambulation . Utilize home lighting for dim lit areas . Have self and pet awareness at all times  Plan: . CCM RN CM will follow up in 30 days   Initial goal documentation        Patient verbalizes understanding of instructions provided today.   Telephone follow up appointment with care management team member scheduled for: 11-08-2019 at 230pm  Alto Denver RN, MSN, CCM Community Care Coordinator [facility]  [facility] Mobile: [phone]

## 2019-10-15 NOTE — Chronic Care Management (AMB) (Signed)
Care Management   Follow Up Note   10/15/2019 Name: [name] [name] MRN: [id number] DOB: 1960/07/27  Referred by: [name], DO Reason for referral : Care Coordination (RNCM Initial Outreach for Chronic Disease Management and Care Coordination Needs. )   [name] [name] is a 59 y.o. year old male who is a primary care patient of [name], DO. The care management team was consulted for assistance with care management and care coordination needs.    Review of patient status, including review of consultants reports, relevant laboratory and other test results, and collaboration with appropriate care team members and the patient's provider was performed as part of comprehensive patient evaluation and provision of chronic care management services.    SDOH (Social Determinants of Health) assessments performed: Yes See Care Plan activities for detailed interventions related to SDOH)  SDOH Interventions     Most Recent Value  SDOH Interventions  Transportation Interventions Other (Comment)  [patient had to cut the call short as he was getting an incoming call to get adequate transportation for his cardiologist appointment tomorrow.]       Advanced Directives: See Care Plan and Vynca application for related entries.   Goals Addressed              This Visit's Progress   .  COMPLETED: RN-I need help managing my overall health (pt-stated)        Current Barriers: Completed. The patient is new to this practice and RNCM. Starting with a new goal . Lacks caregiver support.  . Corporate treasurer.  . Literacy barriers . Transportation barriers . Difficulty obtaining medications . Cognitive Deficits . Chronic Disease Management support and education needs related to managing multiple chronic diseases as evidenced by multiple hospitalizations in the last 6 months.  Nurse Case Manager Clinical Goal(s):  [name] Over the next 90 days, patient will not experience  hospital admission. Hospital Admissions in last 6 months = 5  Interventions:  . Discussed plans with patient for ongoing care management follow up and provided patient with direct contact information for care management team  . Patient remains admitted to the hospital for wound infection  to bilateral lower extremities.  Patient stated he is getting antibiotics and dressing changes . Patient stated he plans to see vein and vascular and ortho when he leaves the hospital also. . Patient stated he plan to see new PCP 12/15 at [facility].  . Patient in good spirits but does say his legs have been in a lot of pain.  . Patient states he does have transportation for when he is discharged from the hospital  Patient Self Care Activities:  . Currently UNABLE TO independently manage multiple chronic disease processes as evidenced by 5 hospital admits in the last 6 mos  Please see past updates related to this goal by clicking on the "Past Updates" button in the selected goal  Previous goal of weight loss d/c'd related to patient's new focus      .  RNCM: Pt-"I don't feel safe enough to weigh" (pt-stated)        CARE PLAN ENTRY (see longitudinal plan of care for additional care plan information)  Current Barriers:  [name] Knowledge deficit related to basic heart failure pathophysiology and self care management . Lack of scale in home- has a scale but does not feel safe enough to weigh due to balance issues . Financial strain . Lack of caregiver support . History of non-compliance . Transportation barriers  Nurse Case Manager Clinical Goal(s):   Over the next 120 days, patient will weigh self daily and record- when safe to do so  Over the next 120 days, patient will verbalize understanding of Heart Failure Action Plan and when to call doctor  Over the next 120 days, patient will take all Heart Failure mediations as prescribed  Interventions:  . Basic overview and discussion of  pathophysiology of Heart Failure . Provided written and verbal education on low sodium diet. Review of heart healthy/ADA diet. The patient endorses compliance. Had to cut the call short, will explore further at next outreach . Reviewed Heart Failure Action Plan in depth and provided written copy . Assessed for scales in home . Discussed importance of daily weight.  The patient has a scale at home but does not feel safe to weigh on the scale. The patients weight in the office at first visit with pcp was 410 pounds. . Reviewed role of diuretics in prevention of fluid overload . Review of fluid/water weight versus body weight. The patient states he does have fluctuations in his weights but does not know how much because he is concerned about safety.  . Evaluation of upcoming appointments. The patient has an appointment to see the cardiologist tomorrow at [facility] pending adequate transportation. The patient also has follow up with pcp on 11-13-2019.   Patient Self Care Activities:  . Take Heart Failure Medications as prescribed . Weigh daily and record (notify MD with 3 lb weight gain over night or 5 lb in a week)- when safe to do so . Follow CHF Action Plan . Adhere to low sodium diet  Initial goal documentation     .  RNCM: Pt-"I had a fall three nights ago" (pt-stated)        CARE PLAN ENTRY (see longitudinal plan of care for additional care plan information)  Current Barriers:  [name] Knowledge Deficits related to fall precautions in patient with  . Decreased adherence to prescribed treatment for fall prevention . Lacks caregiver support.  . Transportation barriers . Non-adherence to scheduled provider appointments . Non-adherence to prescribed medication regimen  Clinical Goal(s):  [name] Over the next 120 days, patient will demonstrate improved adherence to prescribed treatment plan for decreasing falls as evidenced by patient reporting and review of EMR . Over the next 120 days,  patient will verbalize using fall risk reduction strategies discussed . Over the next 120 days, patient will not experience additional falls . Over the next 120 days, patient will verbalize understanding of plan for reducing fall events and remaining safe in his home environment.   Interventions:  . Provided written and verbal education re: Potential causes of falls and Fall prevention strategies . Reviewed medications and discussed potential side effects of medications such as dizziness and frequent urination . Assessed for s/s of orthostatic hypotension . Assessed for falls since last encounter. Patient had a fall "three nights ago" Friday night on 10-12-2019. Denies any head injuries. Larey Seat out of his "office chair", was on the floor for a while and made it on the floor to his bed and was able to pull himself up off of the floor. Denies any injuries.  . Assessed patients knowledge of fall risk prevention secondary to previously provided education. . Assessed working status of life alert bracelet and patient adherence- unable to assess this call. Will assess on next outreach. The patient had to take an incoming call concerning transportation.  . Provided patient information for fall alert  systems . Reviewed scheduled/upcoming provider appointments including: appointment with cardiology on 10-16-2019 pending transportation and follow up with pcp on 11-13-2019  Patient Self Care Activities:  . Utilize cane/walker (assistive device) appropriately with all ambulation . De-clutter walkways . Change positions slowly . Wear secure fitting shoes at all times with ambulation . Utilize home lighting for dim lit areas . Have self and pet awareness at all times  Plan: . CCM RN CM will follow up in 30 days   Initial goal documentation         Telephone follow up appointment with care management team member scheduled for: 11-08-2019 at 230 pm  Alto Denver RN, MSN, CCM Community Care Coordinator [facility]  Health  [facility] [place] Mobile: [phone]

## 2019-10-16 ENCOUNTER — Telehealth: Payer: Self-pay | Admitting: Licensed Clinical Social Worker

## 2019-10-16 DIAGNOSIS — Z5181 Encounter for therapeutic drug level monitoring: Secondary | ICD-10-CM | POA: Diagnosis not present

## 2019-10-16 DIAGNOSIS — Z7901 Long term (current) use of anticoagulants: Secondary | ICD-10-CM | POA: Diagnosis not present

## 2019-10-16 DIAGNOSIS — I10 Essential (primary) hypertension: Secondary | ICD-10-CM | POA: Diagnosis not present

## 2019-10-16 DIAGNOSIS — I482 Chronic atrial fibrillation, unspecified: Secondary | ICD-10-CM | POA: Diagnosis not present

## 2019-10-16 DIAGNOSIS — E876 Hypokalemia: Secondary | ICD-10-CM | POA: Diagnosis not present

## 2019-10-16 NOTE — Telephone Encounter (Signed)
Erroneous encounter  [name], BSW, MSW, LCSW [facility]  [email or web address] Phone: [phone]

## 2019-10-24 ENCOUNTER — Ambulatory Visit: Payer: Self-pay | Admitting: Pharmacist

## 2019-10-24 DIAGNOSIS — I5032 Chronic diastolic (congestive) heart failure: Secondary | ICD-10-CM

## 2019-10-24 DIAGNOSIS — I4819 Other persistent atrial fibrillation: Secondary | ICD-10-CM

## 2019-10-24 NOTE — Patient Instructions (Signed)
Thank you allowing the Care Management Team to be a part of your care! It was a pleasure speaking with you today!     Care Management Team    Alto Denver RN, MSN, CCM Nurse Care Coordinator  [phone]   Duanne Moron PharmD  Clinical Pharmacist  [phone]   [name] LCSW Clinical Social Worker [phone]   Visit Information  Goals Addressed            This Visit's Progress    PharmD - Medication Management       CARE PLAN ENTRY (see longitudinal plan of care for additional care plan information)   Current Barriers:   Chronic Disease Management support, education, and care coordination needs related to T2DM, persistent atrial fibrillation, HFrEF, morbid obesity, COPD, depression, hypothyroidism and gout  Financial Barriers  Pharmacist Clinical Goal(s):   Over the next 30 days, patient will work with CM Pharmacist and PCP to address needs related to medication adherence  Interventions:  Provider and Inter-disciplinary care team collaboration (see longitudinal plan of care)  Perform chart review o Patient seen by Cardiologist on 8/31  - Visit INR: 3.8 - Per telephone note on 9/1, patient contacted with INR result and advised to Hold warfarin for 3 days then go back to his regular dose.  Comprehensive medication review performed; medication list updated in electronic medical record o Reports currently taking warfarin 5 mg once daily. Confirms held warfarin as directed following call from Cardiology clinic on 9/1 - From review of chart, note medication list from Cardiologist lists two separate Rx for warfarin 4 mg and warfarin 5 mg currently active. o Patient reports taking torsemide 20 mg - 2 tablets (40 mg) twice daily.  - From review of chart, note medication list from Cardiologist indicates torsemide dose to be: torsemide 20 mg - 4 tablets (80 mg) twice daily - Patient denies weighing himself daily at home as directed by Cardiology  Counsel  patient on rational for weighing daily as directed by Cardiology team - Remind patient of recommendation from Cardiologist to schedule follow up appointment with HF Clinic. Patient declines  o Reports not currently taking lisinopril 2.5 mg daily as ran out of refills.  o Identify patient not currently taking either duloxetine or trazodone. Reports stopped because ran out of refills o Identify patient not currently taking levothyroxine 50 mcg. Reports stopped because ran out of refills. - Note latest Rx from previous provider indicated lab work needed  From review of chart, latest thyroid labs:  Lab Results   Component  Value  Date     TSH  4.098  06/23/2019   Will collaborate with PCP o Patient reports current albuterol inhaler expired. Note latest albuterol prescription expired. o Counsel patient to review current supply of Nitrostat to check expiration date and call pharmacy for refill if expired  Counsel on importance of medication adherence o Patient reports latest refills of medications from [facility] received in pill bottles. Encourage patient to restart using pill packaging service from pharmacy to aid with medication adherence. Patient agreeable to this plan  Counsel patient on importance of blood pressure control and monitoring o Patient reports taking: metoprolol ER 50 mg daily - Not currently taking lisinopril 2.5 mg as out of refills o Denies having home BP monitor   Patient reports using CPAP consistently when sleeps  Encourage patient to call providers for new or worsening medical concerns  Place coordination of care call to [facility] Cardiology for medication  reconciliation. Leave message with Elease Hashimoto. o Request confirmation of patient's current dose of warfarin and torsemide as well as request refill of lisinopril.   Will collaborate with PCP regarding medication management.   Receive call back from [place], RN with [facility] Cardiology o Andrey Campanile  advises patient to continue warfarin 5 mg daily and clinic will reach out to patient to schedule next appointment for INR o Patient to continue torsemide 20 mg - 2 tablets (40 mg) twice daily for now o Will send in Rx for lisinopril 2.5 mg daily for patient to restart taking  Patient Self Care Activities:   Does not adhere to prescribed medication regimen  Please see past updates related to this goal by clicking on the "Past Updates" button in the selected goal         Patient verbalizes understanding of instructions provided today.   The care management team will reach out to the patient again over the next 14 days.   Duanne Moron, PharmD, [facility] Clinical Pharmacist [facility] Medical [employer] [phone]

## 2019-10-24 NOTE — Chronic Care Management (AMB) (Signed)
Care Management   Follow Up Note   10/24/2019 Name: [name] [name] MRN: [id number] DOB: 09-Oct-1960  Referred by: [name], DO Reason for referral : No chief complaint on file.   [name] [name] is a 59 y.o. year old male who is a primary care patient of [name], DO. The care management team was consulted for assistance with care management and care coordination needs.    Outreach to [employer] by phone today.  Place coordination of care call to [facility] Cardiology.  Review of patient status, including review of consultants reports, relevant laboratory and other test results, and collaboration with appropriate care team members and the patient's provider was performed as part of comprehensive patient evaluation and provision of chronic care management services.    SDOH (Social Determinants of Health) assessments performed: No See Care Plan activities for detailed interventions related to [facility])     Advanced Directives: See Care Plan and Vynca application for related entries.   Goals Addressed            This Visit's Progress   . PharmD - Medication Management       CARE PLAN ENTRY (see longitudinal plan of care for additional care plan information)   Current Barriers:  . Chronic Disease Management support, education, and care coordination needs related to T2DM, persistent atrial fibrillation, HFrEF, morbid obesity, COPD, depression, hypothyroidism and gout . Financial Barriers  Pharmacist Clinical Goal(s):  [name] Over the next 30 days, patient will work with CM Pharmacist and PCP to address needs related to medication adherence  Interventions: . Provider and Inter-disciplinary care team collaboration (see longitudinal plan of care) . Perform chart review o Patient seen by Cardiologist on 8/31  - Visit INR: 3.8 - Per telephone note on 9/1, patient contacted with INR result and advised to "Hold warfarin for 3 days then go back to  his regular dose." . Comprehensive medication review performed; medication list updated in electronic medical record o Reports currently taking warfarin 5 mg once daily. Confirms held warfarin as directed following call from Cardiology clinic on 9/1 - From review of chart, note medication list from Cardiologist lists two separate Rx for warfarin 4 mg and warfarin 5 mg currently active. o Patient reports taking torsemide 20 mg - 2 tablets (40 mg) twice daily.  - From review of chart, note medication list from Cardiologist indicates torsemide dose to be: torsemide 20 mg - 4 tablets (80 mg) twice daily - Patient denies weighing himself daily at home as directed by Cardiology . Counsel patient on rational for weighing daily as directed by Cardiology team - Remind patient of recommendation from Cardiologist to schedule follow up appointment with HF Clinic. Patient declines  o Reports not currently taking lisinopril 2.5 mg daily as ran out of refills.  o Identify patient not currently taking either duloxetine or trazodone. Reports stopped because ran out of refills o Identify patient not currently taking levothyroxine 50 mcg. Reports stopped because ran out of refills. - Note latest Rx from previous provider indicated lab work needed . From review of chart, latest thyroid labs: . Lab Results .  Component . Value . Date .   [name] TSH . 4.098 . 06/23/2019 .  Will collaborate with PCP o Patient reports current albuterol inhaler expired. Note latest albuterol prescription expired. o Counsel patient to review current supply of Nitrostat to check expiration date and call pharmacy for refill if expired . Counsel on importance of medication adherence o Patient  reports latest refills of medications from [facility] received in pill bottles. Encourage patient to restart using pill packaging service from pharmacy to aid with medication adherence. Patient agreeable to this plan . Counsel patient on importance of  blood pressure control and monitoring o Patient reports taking: metoprolol ER 50 mg daily - Not currently taking lisinopril 2.5 mg as out of refills o Denies having home BP monitor  . Patient reports using CPAP consistently when sleeps . Encourage patient to call providers for new or worsening medical concerns . Place coordination of care call to [facility] Cardiology for medication reconciliation. Leave message with Elease Hashimoto. o Request confirmation of patient's current dose of warfarin and torsemide as well as request refill of lisinopril.  . Will collaborate with PCP regarding medication management.  . Receive call back from [place], RN with [facility] Cardiology o Andrey Campanile advises patient to continue warfarin 5 mg daily and clinic will reach out to patient to schedule next appointment for INR o Patient to continue torsemide 20 mg - 2 tablets (40 mg) twice daily for now o Will send in Rx for lisinopril 2.5 mg daily for patient to restart taking  Patient Self Care Activities:  . Does not adhere to prescribed medication regimen  Please see past updates related to this goal by clicking on the "Past Updates" button in the selected goal         Plan  The care management team will reach out to the patient again over the next 14 days.   Duanne Moron, PharmD, [facility] Clinical Pharmacist [facility] Medical [employer] [phone]

## 2019-10-25 ENCOUNTER — Other Ambulatory Visit: Payer: Self-pay | Admitting: Family Medicine

## 2019-10-25 DIAGNOSIS — J432 Centrilobular emphysema: Secondary | ICD-10-CM

## 2019-10-25 DIAGNOSIS — F3341 Major depressive disorder, recurrent, in partial remission: Secondary | ICD-10-CM

## 2019-10-25 DIAGNOSIS — E1169 Type 2 diabetes mellitus with other specified complication: Secondary | ICD-10-CM

## 2019-10-25 DIAGNOSIS — E039 Hypothyroidism, unspecified: Secondary | ICD-10-CM

## 2019-10-25 MED ORDER — ALBUTEROL SULFATE HFA 108 (90 BASE) MCG/ACT IN AERS: 2.0000 | INHALATION_SPRAY | RESPIRATORY_TRACT | 2 refills | Status: DC | PRN

## 2019-10-25 MED ORDER — LEVOTHYROXINE SODIUM 50 MCG PO TABS: 50.0000 ug | ORAL_TABLET | Freq: Every day | ORAL | 1 refills | Status: DC

## 2019-10-25 MED ORDER — DULOXETINE HCL 20 MG PO CPEP: 20.0000 mg | ORAL_CAPSULE | Freq: Every day | ORAL | 1 refills | Status: DC

## 2019-10-25 MED ORDER — TRAZODONE HCL 150 MG PO TABS: 150.0000 mg | ORAL_TABLET | Freq: Every day | ORAL | 1 refills | Status: DC

## 2019-10-25 NOTE — Addendum Note (Signed)
Addended by: [name] on: 10/25/2019 03:33 PM   Modules accepted: Orders

## 2019-10-26 ENCOUNTER — Telehealth: Payer: Self-pay

## 2019-10-26 ENCOUNTER — Ambulatory Visit: Payer: Self-pay | Admitting: Pharmacist

## 2019-10-26 DIAGNOSIS — E039 Hypothyroidism, unspecified: Secondary | ICD-10-CM

## 2019-10-26 NOTE — Chronic Care Management (AMB) (Signed)
  Care Management   Note  10/26/2019 Name: Kyian Obst MRN: [id number] DOB: Oct 16, 1960  Curt Oatis is a 59 y.o. year old male who is a primary care patient of [name], DO and is actively engaged with the care management team. I reached out to [name] by phone today to assist with re-scheduling an initial visit with the RN Case Manager  Follow up plan: Telephone appointment with care management team member scheduled for:11/26/2019  Penne Lash, RMA Care Guide, Embedded Care Coordination [address] [facility]  [place], [place] [address] Direct Dial: [phone] [email or web address] Website: [email or web address]

## 2019-10-26 NOTE — Chronic Care Management (AMB) (Signed)
  Care Management   Note  10/26/2019 Name: [name] [name] MRN: [id number] DOB: 1960/05/10  [name] [name] is a 59 y.o. year old male who is a primary care patient of [name], DO and is actively engaged with the care management team. I reached out to [name] by phone today to assist with re-scheduling an initial visit with the RN Case Manager  Follow up plan: Unsuccessful telephone outreach attempt made. A HIPPA compliant phone message was left for the patient providing contact information and requesting a return call.  The care management team will reach out to the patient again over the next 5 days.  If patient returns call to provider office, please advise to call Embedded Care Management Care Guide Penne Lash  at [phone]  Penne Lash, RMA Care Guide, Embedded Care Coordination [facility]  [place], [place] [address] Direct Dial: [phone] [email or web address] Website: [email or web address]

## 2019-10-26 NOTE — Patient Instructions (Signed)
Thank you allowing the Care Management Team to be a part of your care! It was a pleasure speaking with you today!     Care Management Team    Alto Denver RN, MSN, CCM Nurse Care Coordinator  [phone]   Duanne Moron PharmD  Clinical Pharmacist  [phone]   [name] LCSW Clinical Social Worker [phone]   Visit Information  Goals Addressed            This Visit's Progress    PharmD - Medication Management       CARE PLAN ENTRY (see longitudinal plan of care for additional care plan information)   Current Barriers:   Chronic Disease Management support, education, and care coordination needs related to T2DM, persistent atrial fibrillation, HFrEF, morbid obesity, COPD, depression, hypothyroidism and gout  Financial Barriers  Pharmacist Clinical Goal(s):   Over the next 30 days, patient will work with CM Pharmacist and PCP to address needs related to medication adherence  Interventions:  Provider and Inter-disciplinary care team collaboration (see longitudinal plan of care)  Collaborated with Andrey Campanile, RN with [facility] Cardiology on 9/8 o Sandy advised patient to continue warfarin 5 mg daily and clinic will reach out to patient to schedule next appointment for INR o Patient to continue torsemide 20 mg - 2 tablets (40 mg) twice daily o Stated would send Rx for lisinopril 2.5 mg daily for patient to restart taking  Collaborated with PCP regarding medication management  o PCP sent in new Rx for albuterol, duloxetine, levothyroxine and trazodone  o Patient scheduled for lab work (BMP, A1C, thyroid) prior to upcoming appointment with PCP  Follow up with patient regarding medication management o Let patient know per [facility] with [facility] Cardiology, he is to continue taking warfarin (5 mg daily) and torsemide (2 tablets (40 mg) twice daily) as he has been, she sent in a new Rx for his lisinopril 2.5 mg to restart and that she would follow up  with him to schedule his next INR check.  Counsel on importance of medication adherence o Patient previously reported latest refills of medications from [facility] received in pill bottles.  o Encourage patient to follow up with [facility] today to restart using pill packaging service from pharmacy to aid with medication adherence.  o Remind patient to request refill of Nitrostat from pharmacy o Counsel patient on importance of taking levothyoxine consistently >30 minutes before breakfast. Counsel on importance of separating from doses of ferrous sulfate by at least 4 hours  Encourage patient to call providers for new or worsening medical concerns  Reports his biggest concern today is pain control.  o Encourage patient to reschedule missed appointment with pain management clinic. Provide patient with phone number for provider  Patient Self Care Activities:   Does not adhere to prescribed medication regimen  Patient to attend medical appointments as scheduled o Next appointment with PCP on 9/28  Please see past updates related to this goal by clicking on the "Past Updates" button in the selected goal         Patient verbalizes understanding of instructions provided today.   Telephone follow up appointment with care management team member scheduled for: 10/11 at 1 pm  Duanne Moron, PharmD, [facility] Clinical Pharmacist [facility] Medical [employer] [phone]

## 2019-10-26 NOTE — Chronic Care Management (AMB) (Signed)
Care Management   Follow Up Note   10/26/2019 Name: Asa Baudoin MRN: [id number] DOB: [birth year]  Referred by: [name], DO Reason for referral : Chronic Care Management (Patient Phone Call)   Jene Oravec is a 59 y.o. year old male who is a primary care patient of [name], DO. The care management team was consulted for assistance with care management and care coordination needs.    Outreach to [employer] by phone today.  Review of patient status, including review of consultants reports, relevant laboratory and other test results, and collaboration with appropriate care team members and the patient's provider was performed as part of comprehensive patient evaluation and provision of chronic care management services.    SDOH (Social Determinants of Health) assessments performed: No See Care Plan activities for detailed interventions related to [facility])     Advanced Directives: See Care Plan and Vynca application for related entries.   Goals Addressed            This Visit's Progress   . PharmD - Medication Management       CARE PLAN ENTRY (see longitudinal plan of care for additional care plan information)   Current Barriers:  . Chronic Disease Management support, education, and care coordination needs related to T2DM, persistent atrial fibrillation, HFrEF, morbid obesity, COPD, depression, hypothyroidism and gout . Financial Barriers  Pharmacist Clinical Goal(s):  [name] Over the next 30 days, patient will work with CM Pharmacist and PCP to address needs related to medication adherence  Interventions: . Provider and Inter-disciplinary care team collaboration (see longitudinal plan of care) . Collaborated with Andrey Campanile, RN with [facility] Cardiology on 9/8 o Andrey Campanile advised patient to continue warfarin 5 mg daily and clinic will reach out to patient to schedule next appointment for INR o Patient to continue torsemide 20 mg - 2 tablets  (40 mg) twice daily o Stated would send Rx for lisinopril 2.5 mg daily for patient to restart taking . Collaborated with PCP regarding medication management  o PCP sent in new Rx for albuterol, duloxetine, levothyroxine and trazodone  o Patient scheduled for lab work (BMP, A1C, thyroid) prior to upcoming appointment with PCP . Follow up with patient regarding medication management o Let patient know per [facility] with [facility], he is to continue taking warfarin (5 mg daily) and torsemide (2 tablets (40 mg) twice daily) as he has been, she sent in a new Rx for his lisinopril 2.5 mg to restart and that she would follow up with him to schedule his next INR check. Remus Loffler on importance of medication adherence o Patient previously reported latest refills of medications from [facility] received in pill bottles.  o Encourage patient to follow up with [facility] today to restart using pill packaging service from pharmacy to aid with medication adherence.  o Remind patient to request refill of Nitrostat from pharmacy o Counsel patient on importance of taking levothyoxine consistently >30 minutes before breakfast. Counsel on importance of separating from doses of ferrous sulfate by at least 4 hours . Encourage patient to call providers for new or worsening medical concerns . Reports his biggest concern today is pain control.  o Encourage patient to reschedule missed appointment with pain management clinic. Provide patient with phone number for provider  Patient Self Care Activities:  . Does not adhere to prescribed medication regimen . Patient to attend medical appointments as scheduled o Next appointment with PCP on 9/28  Please see past updates  related to this goal by clicking on the "Past Updates" button in the selected goal         Plan  Telephone follow up appointment with care management team member scheduled for: 10/11 at 1 pm  Duanne Moron, PharmD, [facility] Clinical  Pharmacist [facility] [employer] [phone]

## 2019-10-29 DIAGNOSIS — R32 Unspecified urinary incontinence: Secondary | ICD-10-CM | POA: Diagnosis not present

## 2019-10-29 DIAGNOSIS — E119 Type 2 diabetes mellitus without complications: Secondary | ICD-10-CM | POA: Diagnosis not present

## 2019-10-30 ENCOUNTER — Other Ambulatory Visit: Payer: Self-pay

## 2019-10-30 ENCOUNTER — Encounter: Payer: Medicaid Other | Attending: Physician Assistant | Admitting: Physician Assistant

## 2019-10-30 DIAGNOSIS — L97812 Non-pressure chronic ulcer of other part of right lower leg with fat layer exposed: Secondary | ICD-10-CM | POA: Diagnosis not present

## 2019-10-30 DIAGNOSIS — L97822 Non-pressure chronic ulcer of other part of left lower leg with fat layer exposed: Secondary | ICD-10-CM | POA: Diagnosis not present

## 2019-10-30 DIAGNOSIS — E11622 Type 2 diabetes mellitus with other skin ulcer: Secondary | ICD-10-CM | POA: Diagnosis not present

## 2019-10-30 DIAGNOSIS — J449 Chronic obstructive pulmonary disease, unspecified: Secondary | ICD-10-CM | POA: Diagnosis not present

## 2019-10-30 DIAGNOSIS — I480 Paroxysmal atrial fibrillation: Secondary | ICD-10-CM | POA: Diagnosis not present

## 2019-10-30 DIAGNOSIS — I890 Lymphedema, not elsewhere classified: Secondary | ICD-10-CM | POA: Insufficient documentation

## 2019-10-30 DIAGNOSIS — Z7901 Long term (current) use of anticoagulants: Secondary | ICD-10-CM | POA: Insufficient documentation

## 2019-10-30 DIAGNOSIS — I5042 Chronic combined systolic (congestive) and diastolic (congestive) heart failure: Secondary | ICD-10-CM | POA: Insufficient documentation

## 2019-10-30 DIAGNOSIS — I872 Venous insufficiency (chronic) (peripheral): Secondary | ICD-10-CM | POA: Insufficient documentation

## 2019-10-30 DIAGNOSIS — G4730 Sleep apnea, unspecified: Secondary | ICD-10-CM | POA: Diagnosis not present

## 2019-10-30 DIAGNOSIS — Z86711 Personal history of pulmonary embolism: Secondary | ICD-10-CM | POA: Insufficient documentation

## 2019-10-30 NOTE — Progress Notes (Signed)
[name] [name] ([id number]) Visit Report for 10/30/2019 Allergy List Details Patient Name: [name] [name], [name] [name] Date of Service: 10/30/2019 12:45 PM Medical Record Number: [id number] Patient Account Number: [id number] Date of Birth/Sex: 1960-04-27 (59 y.o. M) Treating RN: Huel Coventry Primary Care Rena Hunke: Saralyn Pilar Other Clinician: Referring Delano Scardino: Referral, Self Treating Khamarion Bjelland/Extender: Linwood Dibbles, HOYT Weeks in Treatment: 25 Allergies Active Allergies No Known Drug Allergies Allergy Notes Electronic Signature(s) Signed: 10/30/2019 4:28:55 PM By: Elliot Gurney, BSN, RN, CWS, Kim RN, BSN Entered By: Elliot Gurney, BSN, RN, CWS, Kim on 10/30/2019 13:11:27 [name] [name] ([id number]) -------------------------------------------------------------------------------- Arrival Information Details Patient Name: [name] [name] Date of Service: 10/30/2019 12:45 PM Medical Record Number: [id number] Patient Account Number: [id number] Date of Birth/Sex: Oct 19, 1960 (59 y.o. M) Treating RN: Huel Coventry Primary Care Kayhan Boardley: Saralyn Pilar Other Clinician: Referring Desmond Tufano: Referral, Self Treating Debie Ashline/Extender: Linwood Dibbles, HOYT Weeks in Treatment: 25 Visit Information Patient Arrived: Wheel Chair Arrival Time: 12:48 Accompanied By: self Transfer Assistance: None Patient Identification Verified: Yes Secondary Verification Process Completed: Yes Patient Has Alerts: Yes Patient Alerts: Patient on Blood Thinner Warfarin History Since Last Visit Added or deleted any medications: No Any new allergies or adverse reactions: No Had a fall or experienced change in activities of daily living that may affect risk of falls: No Signs or symptoms of abuse/neglect since last visito No Hospitalized since last visit: No Implantable device outside of the clinic excluding cellular tissue based products placed in the center since last visit: No Electronic Signature(s) Signed: 10/30/2019 4:39:01 PM  By: Dayton Martes RCP, RRT, CHT Entered By: Dayton Martes on 10/30/2019 12:48:44 [name] [name] ([id number]) -------------------------------------------------------------------------------- Compression Therapy Details Patient Name: [name] [name] Date of Service: 10/30/2019 12:45 PM Medical Record Number: [id number] Patient Account Number: [id number] Date of Birth/Sex: 23-Oct-1960 (59 y.o. M) Treating RN: Tyler Aas Primary Care [place] Lasseter: Saralyn Pilar Other Clinician: Referring Ireoluwa Grant: Referral, Self Treating Tkeyah Burkman/Extender: Linwood Dibbles, HOYT Weeks in Treatment: 25 Compression Therapy Performed for Wound Assessment: Wound #5 Left,Lateral Lower Leg Performed By: Clinician Tyler Aas, RN Compression Type: Four Layer Post Procedure Diagnosis Same as Pre-procedure Electronic Signature(s) Signed: 10/30/2019 4:44:41 PM By: Tyler Aas Entered By: Tyler Aas on 10/30/2019 13:40:21 [name] [name] ([id number]) -------------------------------------------------------------------------------- Compression Therapy Details Patient Name: [name] [name] Date of Service: 10/30/2019 12:45 PM Medical Record Number: [id number] Patient Account Number: [id number] Date of Birth/Sex: February 03, 1961 (59 y.o. M) Treating RN: Tyler Aas Primary Care Isahia Hollerbach: Saralyn Pilar Other Clinician: Referring Ailsa Mireles: Referral, Self Treating Val Schiavo/Extender: Linwood Dibbles, HOYT Weeks in Treatment: 25 Compression Therapy Performed for Wound Assessment: Wound #9 Right,Lateral Lower Leg Performed By: Clinician Tyler Aas, RN Compression Type: Four Layer Post Procedure Diagnosis Same as Pre-procedure Electronic Signature(s) Signed: 10/30/2019 4:44:41 PM By: Tyler Aas Entered By: Tyler Aas on 10/30/2019 13:40:21 [name] [name]  ([id number]) -------------------------------------------------------------------------------- Encounter Discharge Information Details Patient Name: [name] [name] Date of Service: 10/30/2019 12:45 PM Medical Record Number: [id number] Patient Account Number: [id number] Date of Birth/Sex: [birth year] (59 y.o. M) Treating RN: Tyler Aas Primary Care Tashawn Greff: Saralyn Pilar Other Clinician: Referring Safa Derner: Referral, Self Treating Chanda Laperle/Extender: Linwood Dibbles, HOYT Weeks in Treatment: 25 Encounter Discharge Information Items Discharge Condition: Stable Ambulatory Status: Wheelchair Discharge Destination: Home Transportation: Private Auto Accompanied By: self Schedule Follow-up Appointment: Yes Clinical Summary of Care: Electronic Signature(s) Signed: 10/30/2019 4:44:41 PM By: Tyler Aas Entered By: Tyler Aas on 10/30/2019 13:47:55 [name] [name] ([id number]) -------------------------------------------------------------------------------- Lower Extremity Assessment Details Patient Name: [name] [name] Date of Service: 10/30/2019 12:45 PM Medical Record Number: [id number] Patient Account Number: [id number] Date of  Birth/Sex: 07-Nov-1960 (59 y.o. M) Treating RN: Huel Coventry Primary Care Saamir Armstrong: Saralyn Pilar Other Clinician: Referring Corin Tilly: Referral, Self Treating Pricilla Moehle/Extender: Linwood Dibbles, HOYT Weeks in Treatment: 25 Edema Assessment Assessed: [Left: Yes] [Right: Yes] Edema: [Left: Yes] [Right: Yes] Calf Left: Right: Point of Measurement: 31 cm From Medial Instep 58.5 cm 49 cm Ankle Left: Right: Point of Measurement: 13 cm From Medial Instep 38.5 cm 35.5 cm Vascular Assessment Pulses: Dorsalis Pedis Palpable: [Left:No] [Right:No] Doppler Audible: [Left:Yes] [Right:Yes] Posterior Tibial Palpable: [Left:No] [Right:No] Doppler Audible: [Left:Yes] [Right:Yes] Blood Pressure: Brachial: [Right:130] Dorsalis Pedis:  140 [Left:Dorsalis Pedis: 160] Ankle: Posterior Tibial: 220 [Left:Posterior Tibial: 30 1.69] [Right:1.23] Notes Patient is non-compressible on Left >220. Electronic Signature(s) Signed: 10/30/2019 4:28:55 PM By: Elliot Gurney, BSN, RN, CWS, Kim RN, BSN Entered By: Elliot Gurney, BSN, RN, CWS, Kim on 10/30/2019 13:29:35 REICHEN, HUTZLER ([id number]) -------------------------------------------------------------------------------- Multi Wound Chart Details Patient Name: [name] [name] Date of Service: 10/30/2019 12:45 PM Medical Record Number: [id number] Patient Account Number: [id number] Date of Birth/Sex: [birth year] (59 y.o. M) Treating RN: Tyler Aas Primary Care Koby Hartfield: Saralyn Pilar Other Clinician: Referring Glendale Wherry: Referral, Self Treating Natilee Gauer/Extender: Linwood Dibbles, HOYT Weeks in Treatment: 25 Vital Signs Height(in): 68 Pulse(bpm): 84 Weight(lbs): 400 Blood Pressure(mmHg): 134/72 Body Mass Index(BMI): 61 Temperature(F): 98.1 Respiratory Rate(breaths/min): 18 Photos: [7:No Photos] [8:No Photos] Wound Location: Left, Lateral Lower Leg Right, Anterior Lower Leg Right, Proximal, Medial, Anterior Lower Leg Wounding Event: Gradually Appeared Gradually Appeared Gradually Appeared Primary Etiology: Lymphedema 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 Secondary Etiology: N/A N/A N/A Comorbid History: Asthma, Chronic Obstructive N/A N/A Pulmonary Disease (COPD), Sleep Apnea, Arrhythmia, Congestive Heart Failure, Hypertension, Myocardial Infarction, Type II Diabetes Date Acquired: 02/12/2019 05/17/2019 07/16/2019 Weeks of Treatment: 25 20 14  Wound Status: Open Healed - Epithelialized Healed - Epithelialized Clustered Wound: Yes No No Clustered Quantity: 3 N/A N/A Measurements L x W x D (cm) 8x8x0.2 0x0x0 0x0x0 Area (cm) : 50.265 0 0 Volume (cm) : 10.053 0 0 % Reduction in Area: -154.00% 100.00% N/A % Reduction in Volume: -408.00% 100.00%  N/A Classification: Full Thickness Without Exposed Full Thickness Without Exposed Grade 2 Support Structures Support Structures Exudate Amount: Large N/A N/A Exudate Type: Serous N/A N/A Exudate Color: amber N/A N/A Wound Margin: Flat and Intact N/A N/A Granulation Amount: Medium (34-66%) N/A N/A Granulation Quality: Pink N/A N/A Necrotic Amount: Medium (34-66%) N/A N/A Exposed Structures: Fat Layer (Subcutaneous Tissue): N/A N/A Yes Fascia: No Tendon: No Muscle: No Joint: No Bone: No Epithelialization: None N/A N/A Wound Number: 9 N/A N/A Photos: N/A N/A LESHAWN, STRAKA ([name] [name]) Wound Location: Right, Lateral Lower Leg N/A N/A Wounding Event: Gradually Appeared N/A N/A Primary Etiology: Lymphedema N/A N/A Secondary Etiology: Diabetic Wound/Ulcer of the Lower N/A N/A Extremity Comorbid History: Asthma, Chronic Obstructive N/A N/A Pulmonary Disease (COPD), Sleep Apnea, Arrhythmia, Congestive Heart Failure, Hypertension, Myocardial Infarction, Type II Diabetes Date Acquired: 07/27/2019 N/A N/A Weeks of Treatment: 0 N/A N/A Wound Status: Open N/A N/A Clustered Wound: No N/A N/A Clustered Quantity: N/A N/A N/A Measurements L x W x D (cm) 1.2x2x1 N/A N/A Area (cm) : 1.885 N/A N/A Volume (cm) : 1.885 N/A N/A % Reduction in Area: 0.00% N/A N/A % Reduction in Volume: 0.00% N/A N/A Classification: Full Thickness Without Exposed N/A N/A Support Structures Exudate Amount: Large N/A N/A Exudate Type: Serous N/A N/A Exudate Color: amber N/A N/A Wound Margin: Flat and Intact N/A N/A Granulation Amount: Small (1-33%) N/A N/A Granulation Quality: Red N/A N/A Necrotic Amount:  Large (67-100%) N/A N/A Exposed Structures: Fat Layer (Subcutaneous Tissue): N/A N/A Yes Fascia: No Tendon: No Muscle: No Joint: No Bone: No Epithelialization: None N/A N/A Treatment Notes Electronic Signature(s) Signed: 10/30/2019 4:44:41 PM By: Tyler Aas Entered By: Tyler Aas  on 10/30/2019 13:38:06 [name] [name] ([id number]) -------------------------------------------------------------------------------- Multi-Disciplinary Care Plan Details Patient Name: [name] [name] Date of Service: 10/30/2019 12:45 PM Medical Record Number: [id number] Patient Account Number: [id number] Date of Birth/Sex: Apr 24, 1960 (59 y.o. M) Treating RN: Tyler Aas Primary Care Pecolia Marando: Saralyn Pilar Other Clinician: Referring Naksh Radi: Referral, Self Treating Khloey Chern/Extender: Linwood Dibbles, HOYT Weeks in Treatment: 25 Active Inactive Abuse / Safety / Falls / Self Care Management Nursing Diagnoses: Potential for falls Goals: Patient will remain injury free related to falls Date Initiated: 05/04/2019 Target Resolution Date: 07/21/2019 Goal Status: Active Interventions: Assess fall risk on admission and as needed Provide education on basic hygiene Notes: Nutrition Nursing Diagnoses: Impaired glucose control: actual or potential Goals: Patient/caregiver agrees to and verbalizes understanding of need to use nutritional supplements and/or vitamins as prescribed Date Initiated: 05/04/2019 Target Resolution Date: 07/21/2019 Goal Status: Active Interventions: Assess patient nutrition upon admission and as needed per policy Notes: Orientation to the Wound Care Program Nursing Diagnoses: Knowledge deficit related to the wound healing center program Goals: Patient/caregiver will verbalize understanding of the Wound Healing Center Program Date Initiated: 05/04/2019 Target Resolution Date: 07/21/2019 Goal Status: Active Interventions: Provide education on orientation to the wound center Notes: Venous Leg Ulcer Nursing Diagnoses: Potential for venous Insuffiency (use before diagnosis confirmed) Goals: Patient will maintain optimal edema control Date Initiated: 05/04/2019 Target Resolution Date: 07/21/2019 Goal Status: Active GARNIE, BORCHARDT  ([id number]) Interventions: Compression as ordered Notes: Wound/Skin Impairment Nursing Diagnoses: Impaired tissue integrity Goals: Ulcer/skin breakdown will heal within 14 weeks Date Initiated: 05/04/2019 Target Resolution Date: 07/21/2019 Goal Status: Active Interventions: Assess patient/caregiver ability to obtain necessary supplies Assess patient/caregiver ability to perform ulcer/skin care regimen upon admission and as needed Assess ulceration(s) every visit Notes: Electronic Signature(s) Signed: 10/30/2019 4:44:41 PM By: Tyler Aas Entered By: Tyler Aas on 10/30/2019 13:37:51 [name] [name] ([id number]) -------------------------------------------------------------------------------- Pain Assessment Details Patient Name: [name] [name] Date of Service: 10/30/2019 12:45 PM Medical Record Number: [id number] Patient Account Number: [id number] Date of Birth/Sex: 10/17/1960 (59 y.o. M) Treating RN: Huel Coventry Primary Care Swetha Rayle: Saralyn Pilar Other Clinician: Referring Talmage Teaster: Referral, Self Treating Teri Legacy/Extender: Linwood Dibbles, HOYT Weeks in Treatment: 25 Active Problems Location of Pain Severity and Description of Pain Patient Has Paino Yes Site Locations Rate the pain. Current Pain Level: 7 Character of Pain Describe the Pain: Burning, Stabbing Pain Management and Medication Current Pain Management: Electronic Signature(s) Signed: 10/30/2019 4:28:55 PM By: Elliot Gurney, BSN, RN, CWS, Kim RN, BSN Signed: 10/30/2019 4:39:01 PM By: Dayton Martes RCP, RRT, CHT Entered By: Dayton Martes on 10/30/2019 12:49:16 JAIYON, WANDER ([id number]) -------------------------------------------------------------------------------- Patient/Caregiver Education Details Patient Name: [name] [name] Date of Service: 10/30/2019 12:45 PM Medical Record Number: [id number] Patient Account Number: [id number] Date of Birth/Gender: Mar 03, 1960 (59  y.o. M) Treating RN: Tyler Aas Primary Care Physician: Saralyn Pilar Other Clinician: Referring Physician: Referral, Self Treating Physician/Extender: Skeet Simmer in Treatment: 25 Education Assessment Education Provided To: Patient Education Topics Provided Wound/Skin Impairment: Handouts: Caring for Your Ulcer Methods: Explain/Verbal Responses: State content correctly Electronic Signature(s) Signed: 10/30/2019 4:44:41 PM By: Tyler Aas Entered By: Tyler Aas on 10/30/2019 13:38:24 [name] [name] ([id number]) -------------------------------------------------------------------------------- Wound Assessment Details Patient Name: [name] [name] Date of Service: 10/30/2019 12:45 PM Medical Record Number: [id number] Patient Account  Number: [id number] Date of Birth/Sex: 21-May-1960 (59 y.o. M) Treating RN: Huel Coventry Primary Care Marcus Schwandt: Saralyn Pilar Other Clinician: Referring Leeann Bady: Referral, Self Treating Adelle Zachar/Extender: Linwood Dibbles, HOYT Weeks in Treatment: 25 Wound Status Wound Number: 5 Primary Lymphedema Etiology: Wound Location: Left, Lateral Lower Leg Wound Open Wounding Event: Gradually Appeared Status: Date Acquired: 02/12/2019 Comorbid Asthma, Chronic Obstructive Pulmonary Disease (COPD), Weeks Of Treatment: 25 History: Sleep Apnea, Arrhythmia, Congestive Heart Failure, Clustered Wound: Yes Hypertension, Myocardial Infarction, Type II Diabetes Photos Wound Measurements Length: (cm) 8 Width: (cm) 8 Depth: (cm) 0.2 Clustered Quantity: 3 Area: (cm) 50.265 Volume: (cm) 10.053 % Reduction in Area: -154% % Reduction in Volume: -408% Epithelialization: None Tunneling: No Undermining: No Wound Description Classification: Full Thickness Without Exposed Support Structu Wound Margin: Flat and Intact Exudate Amount: Large Exudate Type: Serous Exudate Color: amber res Foul Odor After Cleansing:  No Slough/Fibrino Yes Wound Bed Granulation Amount: Medium (34-66%) Exposed Structure Granulation Quality: Pink Fascia Exposed: No Necrotic Amount: Medium (34-66%) Fat Layer (Subcutaneous Tissue) Exposed: Yes Necrotic Quality: Adherent Slough Tendon Exposed: No Muscle Exposed: No Joint Exposed: No Bone Exposed: No Treatment Notes Wound #5 (Left, Lateral Lower Leg) Notes scell, xsorb, 4LB, unna anchor JADENN, [place] ([id number]) Electronic Signature(s) Signed: 10/30/2019 4:28:55 PM By: Elliot Gurney, BSN, RN, CWS, Kim RN, BSN Entered By: Elliot Gurney, BSN, RN, CWS, Kim on 10/30/2019 13:24:17 [name] [name] ([id number]) -------------------------------------------------------------------------------- Wound Assessment Details Patient Name: [name] [name] Date of Service: 10/30/2019 12:45 PM Medical Record Number: [id number] Patient Account Number: [id number] Date of Birth/Sex: 11/16/1960 (59 y.o. M) Treating RN: Huel Coventry Primary Care Islam Villescas: Saralyn Pilar Other Clinician: Referring Meagon Duskin: Referral, Self Treating Kery Haltiwanger/Extender: Linwood Dibbles, HOYT Weeks in Treatment: 25 Wound Status Wound Number: 7 Primary Etiology: Venous Leg Ulcer Wound Location: Right, Anterior Lower Leg Wound Status: Healed - Epithelialized Wounding Event: Gradually Appeared Date Acquired: 05/17/2019 Weeks Of Treatment: 20 Clustered Wound: No Wound Measurements Length: (cm) Width: (cm) Depth: (cm) Area: (cm) Volume: (cm) 0 % Reduction in Area: 100% 0 % Reduction in Volume: 100% 0 0 0 Wound Description Classification: Full Thickness Without Exposed Support Structu res Psychologist, prison and probation services) Signed: 10/30/2019 4:28:55 PM By: Elliot Gurney, BSN, RN, CWS, Kim RN, BSN Entered By: Elliot Gurney, BSN, RN, CWS, Kim on 10/30/2019 13:20:27 [name] [name] ([id number]) -------------------------------------------------------------------------------- Wound Assessment Details Patient Name: [name] [name] Date of  Service: 10/30/2019 12:45 PM Medical Record Number: [id number] Patient Account Number: [id number] Date of Birth/Sex: 17-Mar-1960 (59 y.o. M) Treating RN: Huel Coventry Primary Care Imogean Ciampa: Saralyn Pilar Other Clinician: Referring Izaias Krupka: Referral, Self Treating Cyenna Rebello/Extender: Linwood Dibbles, HOYT Weeks in Treatment: 25 Wound Status Wound Number: 8 Primary Etiology: Diabetic Wound/Ulcer of the Lower Extremity Wound Location: Right, Proximal, Medial, Anterior Lower Leg Wound Status: Healed - Epithelialized Wounding Event: Gradually Appeared Date Acquired: 07/16/2019 Weeks Of Treatment: 14 Clustered Wound: No Wound Measurements Length: (cm) Width: (cm) Depth: (cm) Area: (cm) Volume: (cm) 0 % Reduction in Area: 0 % Reduction in Volume: 0 0 0 Wound Description Classification: Grade 2 Electronic Signature(s) Signed: 10/30/2019 4:28:55 PM By: Elliot Gurney, BSN, RN, CWS, Kim RN, BSN Entered By: Elliot Gurney, BSN, RN, CWS, Kim on 10/30/2019 13:17:40 [name] [name] ([id number]) -------------------------------------------------------------------------------- Wound Assessment Details Patient Name: [name] [name] Date of Service: 10/30/2019 12:45 PM Medical Record Number: [id number] Patient Account Number: [id number] Date of Birth/Sex: 1960/07/15 (59 y.o. M) Treating RN: Huel Coventry Primary Care Nichollas Perusse: Saralyn Pilar Other Clinician: Referring Lilygrace Rodick: Referral, Self Treating Marvine Encalade/Extender: STONE III, HOYT Weeks in Treatment: 25 Wound Status Wound Number: 9 Primary Lymphedema  Etiology: Wound Location: Right, Lateral Lower Leg Secondary Diabetic Wound/Ulcer of the Lower Extremity Wounding Event: Gradually Appeared Etiology: Date Acquired: 07/27/2019 Wound Open Weeks Of Treatment: 0 Status: Clustered Wound: No Comorbid Asthma, Chronic Obstructive Pulmonary Disease (COPD), History: Sleep Apnea, Arrhythmia, Congestive Heart Failure, Hypertension, Myocardial Infarction,  Type II Diabetes Photos Wound Measurements Length: (cm) 1.2 Width: (cm) 2 Depth: (cm) 1 Area: (cm) 1.885 Volume: (cm) 1.885 % Reduction in Area: 0% % Reduction in Volume: 0% Epithelialization: None Tunneling: No Undermining: No Wound Description Classification: Full Thickness Without Exposed Support Structu Wound Margin: Flat and Intact Exudate Amount: Large Exudate Type: Serous Exudate Color: amber res Foul Odor After Cleansing: No Slough/Fibrino Yes Wound Bed Granulation Amount: Small (1-33%) Exposed Structure Granulation Quality: Red Fascia Exposed: No Necrotic Amount: Large (67-100%) Fat Layer (Subcutaneous Tissue) Exposed: Yes Necrotic Quality: Adherent Slough Tendon Exposed: No Muscle Exposed: No Joint Exposed: No Bone Exposed: No Treatment Notes Wound #9 (Right, Lateral Lower Leg) Notes scell, xsorb, 4LB, unna anchor ARIEON, CORCORAN ([id number]) Electronic Signature(s) Signed: 10/30/2019 4:28:55 PM By: Elliot Gurney, BSN, RN, CWS, Kim RN, BSN Entered By: Elliot Gurney, BSN, RN, CWS, Kim on 10/30/2019 13:25:27 [name] [name] ([id number]) -------------------------------------------------------------------------------- Vitals Details Patient Name: [name] [name] Date of Service: 10/30/2019 12:45 PM Medical Record Number: [id number] Patient Account Number: [id number] Date of Birth/Sex: 1960/10/19 (59 y.o. M) Treating RN: Huel Coventry Primary Care Varshini Arrants: Saralyn Pilar Other Clinician: Referring Arvid Marengo: Referral, Self Treating Ameliarose Shark/Extender: Linwood Dibbles, HOYT Weeks in Treatment: 25 Vital Signs Time Taken: 12:50 Temperature (F): 98.1 Height (in): 68 Pulse (bpm): 84 Weight (lbs): 400 Respiratory Rate (breaths/min): 18 Body Mass Index (BMI): 60.8 Blood Pressure (mmHg): 134/72 Reference Range: 80 - 120 mg / dl Electronic Signature(s) Signed: 10/30/2019 4:39:01 PM By: Dayton Martes RCP, RRT, CHT Entered By: Dayton Martes on  10/30/2019 12:52:36

## 2019-10-30 NOTE — Progress Notes (Signed)
[name] [name], [name] [name] ([id number]) Visit Report for 10/30/2019 Abuse/Suicide Risk Screen Details Patient Name: [name] [name], [name] [name] Date of Service: 10/30/2019 12:45 PM Medical Record Number: [id number] Patient Account Number: [id number] Date of Birth/Sex: 03/16/1960 (59 y.o. M) Treating RN: Tyler Aas Primary Care [name] [name]: Saralyn Pilar Other Clinician: Referring Kayode Petion: Referral, Self Treating Shamicka Inga/Extender: Linwood Dibbles, HOYT Weeks in Treatment: 25 Abuse/Suicide Risk Screen Items Answer ABUSE RISK SCREEN: Has anyone close to you tried to hurt or harm you recentlyo No Do you feel uncomfortable with anyone in your familyo No Has anyone forced you do things that you didnot want to doo No Electronic Signature(s) Signed: 10/30/2019 4:44:41 PM By: Tyler Aas Entered By: Tyler Aas on 10/30/2019 13:01:47 [name] [name] ([id number]) -------------------------------------------------------------------------------- Activities of Daily Living Details Patient Name: [name] [name], [name] [name] Date of Service: 10/30/2019 12:45 PM Medical Record Number: [id number] Patient Account Number: [id number] Date of Birth/Sex: Jul 27, 1960 (59 y.o. M) Treating RN: Tyler Aas Primary Care Clarabelle Oscarson: Saralyn Pilar Other Clinician: Referring Othello Sgroi: Referral, Self Treating Takeru Bose/Extender: Linwood Dibbles, HOYT Weeks in Treatment: 25 Activities of Daily Living Items Answer Activities of Daily Living (Please select one for each item) Drive Automobile Completely Able Take Medications Completely Able Use Telephone Completely Able Care for Appearance Completely Able Use Toilet Completely Able Bath / Shower Need Assistance Dress Self Need Assistance Feed Self Completely Able Walk Need Assistance Get In / Out Bed Completely Able Housework Need Assistance Prepare Meals Need Assistance Handle Money Need Assistance Shop for Self Completely Able Electronic Signature(s) Signed: 10/30/2019  4:44:41 PM By: Tyler Aas Entered By: Tyler Aas on 10/30/2019 13:03:15 [name] [name] ([id number]) -------------------------------------------------------------------------------- Education Screening Details Patient Name: [name] [name] Date of Service: 10/30/2019 12:45 PM Medical Record Number: [id number] Patient Account Number: [id number] Date of Birth/Sex: 1960/02/21 (59 y.o. M) Treating RN: Tyler Aas Primary Care Clessie Karras: Saralyn Pilar Other Clinician: Referring Demaryius Imran: Referral, Self Treating Gray Doering/Extender: Skeet Simmer in Treatment: 25 Primary Learner Assessed: Patient Learning Preferences/Education Level/Primary Language Learning Preference: Explanation, Demonstration, Printed Material Preferred Language: English Cognitive Barrier Language Barrier: No Translator Needed: No Memory Deficit: No Emotional Barrier: No Cultural/Religious Beliefs Affecting Medical Care: No Physical Barrier Impaired Vision: No Impaired Hearing: No Decreased Hand dexterity: No Knowledge/Comprehension Knowledge Level: High Comprehension Level: High Ability to understand written instructions: High Ability to understand verbal instructions: High Motivation Anxiety Level: Calm Cooperation: Cooperative Education Importance: Acknowledges Need Interest in Health Problems: Asks Questions Perception: Coherent Willingness to Engage in Self-Management High Activities: Readiness to Engage in Self-Management High Activities: Electronic Signature(s) Signed: 10/30/2019 4:44:41 PM By: Tyler Aas Entered By: Tyler Aas on 10/30/2019 13:03:45 [name] [name] ([id number]) -------------------------------------------------------------------------------- Fall Risk Assessment Details Patient Name: [name] [name] Date of Service: 10/30/2019 12:45 PM Medical Record Number: [id number] Patient Account Number: [id number] Date of Birth/Sex: Sep 27, 1960 (59  y.o. M) Treating RN: Tyler Aas Primary Care Jianni Shelden: Saralyn Pilar Other Clinician: Referring Lynwood Kubisiak: Referral, Self Treating Yisel Megill/Extender: Linwood Dibbles, HOYT Weeks in Treatment: 25 Fall Risk Assessment Items Have you had 2 or more falls in the last 12 monthso 0 Yes Have you had any fall that resulted in injury in the last 12 monthso 0 Yes FALLS RISK SCREEN History of falling - immediate or within 3 months 25 Yes Secondary diagnosis (Do you have 2 or more medical diagnoseso) 15 Yes Ambulatory aid None/bed rest/wheelchair/nurse 0 Yes Crutches/cane/walker 0 No Furniture 0 No Intravenous therapy Access/Saline/Heparin Lock 0 No Gait/Transferring Normal/ bed rest/ wheelchair 0 No Weak (short steps with or without shuffle, stooped but able to lift head while  walking, may 10 Yes seek support from furniture) Impaired (short steps with shuffle, may have difficulty arising from chair, head down, impaired 0 No balance) Mental Status Oriented to own ability 0 No Electronic Signature(s) Signed: 10/30/2019 4:44:41 PM By: Tyler Aas Entered By: Tyler Aas on 10/30/2019 13:04:49 [name] [name] ([id number]) -------------------------------------------------------------------------------- Foot Assessment Details Patient Name: [name] [name] Date of Service: 10/30/2019 12:45 PM Medical Record Number: [id number] Patient Account Number: [id number] Date of Birth/Sex: March 22, 1960 (59 y.o. M) Treating RN: Tyler Aas Primary Care Susan Arana: Saralyn Pilar Other Clinician: Referring Lennette Fader: Referral, Self Treating Bryttany Tortorelli/Extender: Linwood Dibbles, HOYT Weeks in Treatment: 25 Foot Assessment Items Site Locations + = Sensation present, - = Sensation absent, C = Callus, U = Ulcer R = Redness, W = Warmth, M = Maceration, PU = Pre-ulcerative lesion F = Fissure, S = Swelling, D = Dryness Assessment Right: Left: Other Deformity: No No Prior Foot Ulcer: No  No Prior Amputation: No No Charcot Joint: No No Ambulatory Status: Ambulatory With Help Assistance Device: Wheelchair Gait: Academic librarian Signature(s) Signed: 10/30/2019 4:44:41 PM By: Tyler Aas Entered By: Tyler Aas on 10/30/2019 13:08:55 [name] [name] ([id number]) -------------------------------------------------------------------------------- Nutrition Risk Screening Details Patient Name: [name] [name] Date of Service: 10/30/2019 12:45 PM Medical Record Number: [id number] Patient Account Number: [id number] Date of Birth/Sex: October 20, 1960 (59 y.o. M) Treating RN: Tyler Aas Primary Care Princeston Blizzard: Saralyn Pilar Other Clinician: Referring Hardie Veltre: Referral, Self Treating Malerie Eakins/Extender: Linwood Dibbles, HOYT Weeks in Treatment: 25 Height (in): 68 Weight (lbs): 400 Body Mass Index (BMI): 60.8 Nutrition Risk Screening Items Score Screening NUTRITION RISK SCREEN: I have an illness or condition that made me change the kind and/or amount of food I eat 0 No I eat fewer than two meals per day 0 No I eat few fruits and vegetables, or milk products 2 Yes I have three or more drinks of beer, liquor or wine almost every day 0 No I have tooth or mouth problems that make it hard for me to eat 0 No I don't always have enough money to buy the food I need 0 No I eat alone most of the time 0 No I take three or more different prescribed or over-the-counter drugs a day 1 Yes Without wanting to, I have lost or gained 10 pounds in the last six months 2 Yes I am not always physically able to shop, cook and/or feed myself 0 No Nutrition Protocols Good Risk Protocol Moderate Risk Protocol 0 Provide education on nutrition High Risk Proctocol Risk Level: Moderate Risk Score: 5 Electronic Signature(s) Signed: 10/30/2019 4:44:41 PM By: Tyler Aas Entered By: Tyler Aas on 10/30/2019 13:05:21

## 2019-11-01 ENCOUNTER — Telehealth: Payer: Self-pay | Admitting: Licensed Clinical Social Worker

## 2019-11-01 ENCOUNTER — Telehealth: Payer: Self-pay

## 2019-11-01 NOTE — Telephone Encounter (Signed)
  Chronic Care Management    Clinical Social Work General Follow Up Note  11/01/2019 Name: [name] [name] MRN: [id number] DOB: Jul 13, 1960  [name] [name] is a 59 y.o. year old male who is a primary care patient of [name], DO. The CCM team was consulted for assistance with [name] .   Review of patient status, including review of consultants reports, relevant laboratory and other test results, and collaboration with appropriate care team members and the patient's provider was performed as part of comprehensive patient evaluation and provision of chronic care management services.    LCSW completed CCM outreach attempt today but was unable to reach patient successfully. A HIPPA compliant voice message was left encouraging patient to return call once available. LCSW will ask Scheduling Care Guide to reschedule CCM SW appointment with patient as well.  Outpatient Encounter Medications as of 11/01/2019  Medication Sig  . albuterol (VENTOLIN HFA) 108 (90 Base) MCG/ACT inhaler Inhale 2 puffs into the lungs every 4 (four) hours as needed for wheezing or shortness of breath.  . allopurinol (ZYLOPRIM) 100 MG tablet TAKE 1 TABLET BY MOUTH ONCE DAILY  . atorvastatin (LIPITOR) 40 MG tablet TAKE 1 TABLET BY MOUTH ONCE DAILY  . DULoxetine (CYMBALTA) 20 MG capsule Take 1 capsule (20 mg total) by mouth daily.  . ferrous sulfate 325 (65 FE) MG tablet Take 1 tablet (325 mg total) by mouth 2 (two) times daily with a meal.  . fluticasone (FLONASE) 50 MCG/ACT nasal spray Place 2 sprays into both nostrils 2 (two) times daily. (Patient not taking: Reported on 10/24/2019)  . levothyroxine (SYNTHROID) 50 MCG tablet Take 1 tablet (50 mcg total) by mouth daily before breakfast.  . lisinopril (ZESTRIL) 2.5 MG tablet Take 1 tablet (2.5 mg total) by mouth daily. (Patient not taking: Reported on 10/24/2019)  . loperamide (IMODIUM) 2 MG capsule Take 1 capsule (2 mg total) by mouth as needed for diarrhea  or loose stools. (Patient not taking: Reported on 10/24/2019)  . metoprolol succinate (TOPROL-XL) 50 MG 24 hr tablet Take 1 tablet (50 mg total) by mouth daily. Take with or immediately following a meal.  . montelukast (SINGULAIR) 10 MG tablet Take 1 tablet (10 mg total) by mouth at bedtime.  . naphazoline-glycerin (CLEAR EYES REDNESS) 0.012-0.2 % SOLN Place 1-2 drops into both eyes 4 (four) times daily as needed for eye irritation. (Patient not taking: Reported on 10/24/2019)  . nitroGLYCERIN (NITROSTAT) 0.4 MG SL tablet Place 1 tablet under tongue every 5 minutes as needed for chest pain. (No more than 3 doses within 15 minutes) (Patient not taking: Reported on 10/24/2019)  . thiamine 100 MG tablet Take 1 tablet (100 mg total) by mouth daily. (Patient not taking: Reported on 10/24/2019)  . torsemide (DEMADEX) 20 MG tablet Take 1 tablet (20 mg total) by mouth 2 (two) times daily. (Patient taking differently: Take 40 mg by mouth 2 (two) times daily. )  . traZODone (DESYREL) 150 MG tablet Take 1 tablet (150 mg total) by mouth at bedtime.  [name] warfarin (COUMADIN) 5 MG tablet Take 1 tablet by mouth daily.   No facility-administered encounter medications on file as of 11/01/2019.    Follow Up Plan: Scheduling Care Guide will reach out to patient to reschedule appointment.   [name], BSW, MSW, LCSW [facility] [place]  [facility] [email or web address] Phone: [phone]

## 2019-11-02 NOTE — Progress Notes (Signed)
[name] [name] ([id number]) Visit Report for 10/30/2019 Chief Complaint Document Details Patient Name: [name] [name], [name] [name] Date of Service: 10/30/2019 12:45 PM Medical Record Number: [id number] Patient Account Number: [id number] Date of Birth/Sex: [birth year] (59 y.o. M) Treating RN: Cornell Barman Primary Care Provider: Nobie Putnam Other Clinician: Referring Provider: Referral, Self Treating Provider/Extender: Melburn Hake, Carri Spillers Weeks in Treatment: 25 Information Obtained from: Patient Chief Complaint Bilateral LE ulcers Electronic Signature(s) Signed: 10/30/2019 1:33:27 PM By: Worthy Keeler PA-C Entered By: Worthy Keeler on 10/30/2019 13:33:27 [name] [name] ([id number]) -------------------------------------------------------------------------------- HPI Details Patient Name: [name] [name] Date of Service: 10/30/2019 12:45 PM Medical Record Number: [id number] Patient Account Number: [id number] Date of Birth/Sex: 04-20-1960 (59 y.o. M) Treating RN: Cornell Barman Primary Care Provider: Nobie Putnam Other Clinician: Referring Provider: Referral, Self Treating Provider/Extender: Melburn Hake, Malyah Ohlrich Weeks in Treatment: 25 History of Present Illness HPI Description: 01/18/2019 on evaluation today patient presents for initial evaluation here in our clinic concerning issues he is been having with his bilateral lower extremities. He appears based on what I am seeing to have bilateral lower extremity cellulitis along with chronic venous stasis. He has significant pitting edema even in the bottom of his foot. With that being said he also has a history of hypertension, COPD, congestive heart failure, long-term use of anticoagulants due to atrial fibrillation which is paroxysmal as well as having had a pulmonary embolism in the past. With that being said he tells me that he has constant drainage out of his left lower extremity. This is likely due to the fact that again he has  significant swelling pretty much all the time. With that being said in the past 2 weeks this has been getting worse and he tells me at this point that he feels like it is infected is also hurting a lot more than it has been in the past and the drainage is a different color. The left side is definitely worse than the right. Fortunately there is no signs of active infection systemically although I am concerned due to the severity of the infection that I see in the left lower extremity especially. There is a lot of necrotic tissue on the surface of both wounds that at some point is going to need to be cleaned off to allow these to heal but right now obviously this is much too tender for him. No fevers, chills, nausea, vomiting, or diarrhea. 02/02/2019 on evaluation today patient presents for follow-up concerning the wounds over his bilateral lower extremities. He is continuing to manage. He was in the hospital and has been discharged at this point. He was admitted from 01/21/2019 through 01/27/2019. This was due to cellulitis of lower extremity. Nonetheless he does seem to be doing much better at this point. I did review his discharge summary as well he did not appear to have any positive findings on blood cultures. His white blood cell count was never really elevated either which is also good news. He was treated with IV clindamycin for 4 days and then switch to complete a 10-day course at home which she is still working on at this point. They did have him on IV Lasix in the hospital which helped him diurese well on discharge they stated that his weight was 408 pounds which is still higher than his dry weight of 375 to 380 pounds. Upon discharge his torsemide was increased from 40 to 60 mg twice daily and he is to follow-up with cardiology as well. 12/29-Patient returns at 2 weeks  for evaluation and follow-up of his bilateral lower extremity wounds, with lymphedema, venous ulcerations, patient was  recently hospitalized and was aggressively diuresed after which his torsemide has been increased. He follows up with cardiology as well. His left leg stasis wounds are more moist and have maceration of tissue, the right one is less so. We are using silver cell and unna compression given the degree of edema in the legs. Readmission: 05/04/2019 patient presents today for reevaluation here in the clinic I have not seen him since December 2020. Subsequently this is a fairly sick individual with multiple comorbidities who presents for follow-up concerning wounds of his bilateral lower extremities. He tells me that after he was in the hospital following when we saw him last in December that he ended up subsequently being discharged with home health services and they have been coming out and applying Unna boot wraps. With that being said he has used up all of his home health visits for the year and then for the last week has not had anything on his legs as far as the wrap is concerned. Obviously this is not good and he does come in today with increased swelling or at least what I perceived to be along with somewhat dry wounds again he does not seem to be weeping too much at this point which is at least good news but he has significant lymphedema/venous stasis. No fevers, chills, nausea, vomiting, or diarrhea. 06/01/2019 upon evaluation today patient is seen after not having been seen actually since 19 2021. He kept coming for appointment with every time he would come his temperature would be elevated past the point where we could see him. I told him that he needed to go be seen at the ER during 1 of these times due to the fact that obviously this was a fever of unknown origin at that point and my concern was that it could potentially be his legs but again I was not entirely sure as obviously I could not see them due to the restrictions secondary to Covid even bringing the patient into the clinic. Nonetheless  he finally came in today his temperature again was elevated but not to the same degree that it met the 100.4 or above guidelines of having to cancel the appointment. Either way we did have a look at his legs unfortunately he appears to show signs of cellulitis at this point which is probably where the temperature elevations are emanating from. He has significant lymphedema as well as chronic vent venous stasis as well. When I initially told him that is why I told him to go to the ER previously when his temperature was elevated in case it was something with wounds or otherwise he replied "nobody ever told me to go to the ER". I then subsequently reminded him of me coming to the front desk due to his temperature being elevated and advising him to go to the ER to which she replied "I am not going to the ER and I do not think I need to even with this". Nonetheless I am concerned about the infection of his legs at this point he has significant weeping, significant edema, and overall is not doing very well in my opinion. 4/23; the patient arrives in clinic today having napkin stuck on his wounds. He is taking the antibiotic from last week. Last Wednesday he had his INR checked at the Coumadin clinic it was apparently "sky high" and his Coumadin is now on hold.  He is going back to check this again on Monday. His legs were not put back in compression last week out of fear of the extending infection with fever. He has been taking the antibiotics. Culture from last week showed Enterobacter and methicillin resistant staph aureus. He is on on doxycycline which the MRSA was sensitive to Enterobacter was not plated but given the pattern of sensitivities probably the doxycycline was satisfactory. I will extend the doxycycline another 4 days until he is seen next week. From an infection point of view this looks better 06/14/2019 upon evaluation today patient unfortunately continues to have signs of cellulitis on the  lower extremities bilaterally especially on the left. Unfortunately he doesn't seem to be doing much better despite being on doxycycline for a couple of weeks. He was positive for MRSA as well as Enterobacter. Obviously the MRSA is treated by way of the doxycycline the Enterobacter unfortunately is not I'm going to see about initiating Bactrim for him at this point. 5/13; since he was last here he was hospitalized from 5/7 through 5/11. Mostly this was for congestive heart failure. He has an ejection fraction of 25 to 30%. His torsemide dose was increased. While he was there they dealt with the infection in the right leg. He did not take his Bactrim that he was prescribed last time. He was treated with linezolid in the hospital and cefepime and discharged on Bactrim to complete a 2-week course. He was supposed to follow-up with the Coumadin clinic at his cardiologist's office [Dr. End). He has not done this. He tells me he is on Coumadino 5 mg twice a day. The last INR I see is from 5/11 when he was discharged from the hospital it was 2.1 however it was increasing every day they check this. He was thought to have cellulitis from methicillin-resistant staph aureus. GILLIS, BOARDLEY ([id number]) 5/21; patient's wound on the right lower leg actually looks a lot better although not much change in size. We used Iodoflex under an Radio broadcast assistant last week. The [employer] stayed in place. 07/20/2019 upon evaluation today patient actually appears to be doing much better in regard to his legs in general. He does not have the infection that was previously noted which is great news he was in the hospital which he finally did go after things got significantly worse. Fortunately he seems to be doing much better in regard to his overall appearance today in that regard. With that being said he still is experiencing issues currently with wounds over the lower extremities and his wraps do tend to slip down. We probably do need  to use the Unna paste which I think we are doing here but I am not sure the home health nurses are using right now we may need to send this order along as well. 07/27/2019 upon evaluation today patient appears to be doing excellent in regard to his lower extremities all things considered. He still has significant edema noted but the compression wraps do seem to be helping and overall after getting his infection under good control I feel like things have improved dramatically. There does not appear to be any signs of active infection at this time. No fevers, chills, nausea, vomiting, or diarrhea. Readmission: 10/30/2019 patient presents today for readmission here in the clinic concerning issues that he has been having with his bilateral lower extremities. He tells me after last time he was in the hospital he actually had improvement of his legs to everything got  under much better control was actually doing quite well. Nonetheless right now he has blisters that reopened he tells me about a month ago and has not really been able to get them close. He does not have any compression stockings that he is wearing at this point he came in really been nothing on the wounds currently. Electronic Signature(s) Signed: 10/30/2019 2:27:08 PM By: Worthy Keeler PA-C Entered By: Worthy Keeler on 10/30/2019 14:27:07 KENTARIUS, PARTINGTON ([id number]) -------------------------------------------------------------------------------- Physical Exam Details Patient Name: [name] [name] Date of Service: 10/30/2019 12:45 PM Medical Record Number: [id number] Patient Account Number: [id number] Date of Birth/Sex: 05-16-1960 (59 y.o. M) Treating RN: Cornell Barman Primary Care Provider: Nobie Putnam Other Clinician: Referring Provider: Referral, Self Treating Provider/Extender: Melburn Hake, Jvon Meroney Weeks in Treatment: 25 Constitutional sitting or standing blood pressure is within target range for patient.. pulse regular and  within target range for patient.[name] respirations regular, non- labored and within target range for patient.[name] temperature within target range for patient.. Obese and well-hydrated in no acute distress. Eyes conjunctiva clear no eyelid edema noted. pupils equal round and reactive to light and accommodation. Ears, Nose, Mouth, and Throat no gross abnormality of ear auricles or external auditory canals. normal hearing noted during conversation. mucus membranes moist. Respiratory normal breathing without difficulty. Cardiovascular 1+ dorsalis pedis/posterior tibialis pulses. 2+ pitting edema of the bilateral lower extremities. Musculoskeletal Patient unable to walk without assistance. no significant deformity or arthritic changes, no loss or range of motion, no clubbing. Psychiatric this patient is able to make decisions and demonstrates good insight into disease process. Alert and Oriented x 3. pleasant and cooperative. Notes Upon inspection patient's wounds appeared to be fairly superficial as far as the depth of the wound was concerned there also is no signs of active infection at this time which is great news and a dramatic improvement compared to last time I saw him where to be honest he had significant evidence of infection ended up having MRSA and ended up being hospitalized as a result even after refusing several times to go to the hospital when I wanted him to. In the end that they were able to get this under control which is great and today things are definitely not as bad as they were back at that point. Electronic Signature(s) Signed: 10/30/2019 2:27:57 PM By: Worthy Keeler PA-C Entered By: Worthy Keeler on 10/30/2019 14:27:56 [name] [name] ([id number]) -------------------------------------------------------------------------------- Physician Orders Details Patient Name: [name] [name] Date of Service: 10/30/2019 12:45 PM Medical Record Number: [id number] Patient Account  Number: [id number] Date of Birth/Sex: 1960-07-12 (59 y.o. M) Treating RN: Grover Canavan Primary Care Provider: Nobie Putnam Other Clinician: Referring Provider: Referral, Self Treating Provider/Extender: Melburn Hake, Iasia Forcier Weeks in Treatment: 25 Verbal / Phone Orders: No Diagnosis Coding ICD-10 Coding Code Description I87.2 Venous insufficiency (chronic) (peripheral) I89.0 Lymphedema, not elsewhere classified L97.812 Non-pressure chronic ulcer of other part of right lower leg with fat layer exposed L97.822 Non-pressure chronic ulcer of other part of left lower leg with fat layer exposed [place] (primary) hypertension J44.9 Chronic obstructive pulmonary disease, unspecified Z79.01 Long term (current) use of anticoagulants I48.0 Paroxysmal atrial fibrillation Z86.711 Personal history of pulmonary embolism I50.42 Chronic combined systolic (congestive) and diastolic (congestive) heart failure E11.622 Type 2 diabetes mellitus with other skin ulcer Wound Cleansing Wound #5 Left,Lateral Lower Leg o Dial antibacterial soap, wash wounds, rinse and pat dry prior to dressing wounds Wound #9 Right,Lateral Lower Leg o Dial antibacterial soap, wash wounds, rinse and pat  dry prior to dressing wounds Primary Wound Dressing Wound #5 Left,Lateral Lower Leg o Silver Alginate Wound #9 Right,Lateral Lower Leg o Silver Alginate Secondary Dressing Wound #5 Left,Lateral Lower Leg o XtraSorb Wound #9 Right,Lateral Lower Leg o XtraSorb Dressing Change Frequency Wound #5 Left,Lateral Lower Leg o Change dressing every week Wound #9 Right,Lateral Lower Leg o Change dressing every week Follow-up Appointments Wound #5 Left,Lateral Lower Leg o Return Appointment in 1 week. Wound #9 Right,Lateral Lower Leg o Return Appointment in 1 week. Edema Control HERSHALL, BENKERT ([id number]) Wound #5 Left,Lateral Lower Leg o 4 Layer Compression System - Bilateral o Elevate  legs to the level of the heart and pump ankles as often as possible Wound #9 Right,Lateral Lower Leg o 4 Layer Compression System - Bilateral o Elevate legs to the level of the heart and pump ankles as often as possible Electronic Signature(s) Signed: 10/30/2019 4:44:41 PM By: Grover Canavan Signed: 11/01/2019 5:15:44 PM By: Worthy Keeler PA-C Entered By: Grover Canavan on 10/30/2019 13:41:54 [name] [name] ([id number]) -------------------------------------------------------------------------------- Problem List Details Patient Name: [name] [name] Date of Service: 10/30/2019 12:45 PM Medical Record Number: [id number] Patient Account Number: [id number] Date of Birth/Sex: [birth year] (59 y.o. M) Treating RN: Cornell Barman Primary Care Provider: Nobie Putnam Other Clinician: Referring Provider: Referral, Self Treating Provider/Extender: Melburn Hake, [place]o Rothgeb Weeks in Treatment: 25 Active Problems ICD-10 Encounter Code Description Active Date MDM Diagnosis I87.2 Venous insufficiency (chronic) (peripheral) 05/04/2019 No Yes I89.0 Lymphedema, not elsewhere classified 05/04/2019 No Yes L97.812 Non-pressure chronic ulcer of other part of right lower leg with fat layer 05/04/2019 No Yes exposed L97.822 Non-pressure chronic ulcer of other part of left lower leg with fat layer 05/04/2019 No Yes exposed [place] (primary) hypertension 05/04/2019 No Yes J44.9 Chronic obstructive pulmonary disease, unspecified 05/04/2019 No Yes Z79.01 Long term (current) use of anticoagulants 05/04/2019 No Yes I48.0 Paroxysmal atrial fibrillation 05/04/2019 No Yes Z86.711 Personal history of pulmonary embolism 05/04/2019 No Yes I50.42 Chronic combined systolic (congestive) and diastolic (congestive) heart 05/04/2019 No Yes failure E11.622 Type 2 diabetes mellitus with other skin ulcer 10/30/2019 No Yes Inactive Problems Resolved Problems SHELDEN, RABORN ([id number]) Electronic Signature(s) Signed:  10/30/2019 1:33:20 PM By: Worthy Keeler PA-C Entered By: Worthy Keeler on 10/30/2019 13:33:19 [name] [name] ([id number]) -------------------------------------------------------------------------------- Progress Note Details Patient Name: [name] [name] Date of Service: 10/30/2019 12:45 PM Medical Record Number: [id number] Patient Account Number: [id number] Date of Birth/Sex: Jul 09, 1960 (59 y.o. M) Treating RN: Cornell Barman Primary Care Provider: Nobie Putnam Other Clinician: Referring Provider: Referral, Self Treating Provider/Extender: Melburn Hake, [place] Rollyson Weeks in Treatment: 25 Subjective Chief Complaint Information obtained from Patient Bilateral LE ulcers History of Present Illness (HPI) 01/18/2019 on evaluation today patient presents for initial evaluation here in our clinic concerning issues he is been having with his bilateral lower extremities. He appears based on what I am seeing to have bilateral lower extremity cellulitis along with chronic venous stasis. He has significant pitting edema even in the bottom of his foot. With that being said he also has a history of hypertension, COPD, congestive heart failure, long-term use of anticoagulants due to atrial fibrillation which is paroxysmal as well as having had a pulmonary embolism in the past. With that being said he tells me that he has constant drainage out of his left lower extremity. This is likely due to the fact that again he has significant swelling pretty much all the time. With that being said in the past 2 weeks this has been getting worse and  he tells me at this point that he feels like it is infected is also hurting a lot more than it has been in the past and the drainage is a different color. The left side is definitely worse than the right. Fortunately there is no signs of active infection systemically although I am concerned due to the severity of the infection that I see in the left lower extremity  especially. There is a lot of necrotic tissue on the surface of both wounds that at some point is going to need to be cleaned off to allow these to heal but right now obviously this is much too tender for him. No fevers, chills, nausea, vomiting, or diarrhea. 02/02/2019 on evaluation today patient presents for follow-up concerning the wounds over his bilateral lower extremities. He is continuing to manage. He was in the hospital and has been discharged at this point. He was admitted from 01/21/2019 through 01/27/2019. This was due to cellulitis of lower extremity. Nonetheless he does seem to be doing much better at this point. I did review his discharge summary as well he did not appear to have any positive findings on blood cultures. His white blood cell count was never really elevated either which is also good news. He was treated with IV clindamycin for 4 days and then switch to complete a 10-day course at home which she is still working on at this point. They did have him on IV Lasix in the hospital which helped him diurese well on discharge they stated that his weight was 408 pounds which is still higher than his dry weight of 375 to 380 pounds. Upon discharge his torsemide was increased from 40 to 60 mg twice daily and he is to follow-up with cardiology as well. 12/29-Patient returns at 2 weeks for evaluation and follow-up of his bilateral lower extremity wounds, with lymphedema, venous ulcerations, patient was recently hospitalized and was aggressively diuresed after which his torsemide has been increased. He follows up with cardiology as well. His left leg stasis wounds are more moist and have maceration of tissue, the right one is less so. We are using silver cell and unna compression given the degree of edema in the legs. Readmission: 05/04/2019 patient presents today for reevaluation here in the clinic I have not seen him since December 2020. Subsequently this is a fairly sick individual  with multiple comorbidities who presents for follow-up concerning wounds of his bilateral lower extremities. He tells me that after he was in the hospital following when we saw him last in December that he ended up subsequently being discharged with home health services and they have been coming out and applying Unna boot wraps. With that being said he has used up all of his home health visits for the year and then for the last week has not had anything on his legs as far as the wrap is concerned. Obviously this is not good and he does come in today with increased swelling or at least what I perceived to be along with somewhat dry wounds again he does not seem to be weeping too much at this point which is at least good news but he has significant lymphedema/venous stasis. No fevers, chills, nausea, vomiting, or diarrhea. 06/01/2019 upon evaluation today patient is seen after not having been seen actually since 19 2021. He kept coming for appointment with every time he would come his temperature would be elevated past the point where we could see him. I told him that  he needed to go be seen at the ER during 1 of these times due to the fact that obviously this was a fever of unknown origin at that point and my concern was that it could potentially be his legs but again I was not entirely sure as obviously I could not see them due to the restrictions secondary to Covid even bringing the patient into the clinic. Nonetheless he finally came in today his temperature again was elevated but not to the same degree that it met the 100.4 or above guidelines of having to cancel the appointment. Either way we did have a look at his legs unfortunately he appears to show signs of cellulitis at this point which is probably where the temperature elevations are emanating from. He has significant lymphedema as well as chronic vent venous stasis as well. When I initially told him that is why I told him to go to the ER  previously when his temperature was elevated in case it was something with wounds or otherwise he replied "nobody ever told me to go to the ER". I then subsequently reminded him of me coming to the front desk due to his temperature being elevated and advising him to go to the ER to which she replied "I am not going to the ER and I do not think I need to even with this". Nonetheless I am concerned about the infection of his legs at this point he has significant weeping, significant edema, and overall is not doing very well in my opinion. 4/23; the patient arrives in clinic today having napkin stuck on his wounds. He is taking the antibiotic from last week. Last Wednesday he had his INR checked at the Coumadin clinic it was apparently "sky high" and his Coumadin is now on hold. He is going back to check this again on Monday. His legs were not put back in compression last week out of fear of the extending infection with fever. He has been taking the antibiotics. Culture from last week showed Enterobacter and methicillin resistant staph aureus. He is on on doxycycline which the MRSA was sensitive to Enterobacter was not plated but given the pattern of sensitivities probably the doxycycline was satisfactory. I will extend the doxycycline another 4 days until he is seen next week. From an infection point of view this looks better 06/14/2019 upon evaluation today patient unfortunately continues to have signs of cellulitis on the lower extremities bilaterally especially on the left. Unfortunately he doesn't seem to be doing much better despite being on doxycycline for a couple of weeks. He was positive for MRSA as well as Enterobacter. Obviously the MRSA is treated by way of the doxycycline the Enterobacter unfortunately is not I'm going to see about initiating Bactrim for him at this point. 5/13; since he was last here he was hospitalized from 5/7 through 5/11. Mostly this was for congestive heart failure. He  has an ejection fraction of Beringer, Demtrius ([id number]) 25 to 30%. His torsemide dose was increased. While he was there they dealt with the infection in the right leg. He did not take his Bactrim that he was prescribed last time. He was treated with linezolid in the hospital and cefepime and discharged on Bactrim to complete a 2-week course. He was supposed to follow-up with the Coumadin clinic at his cardiologist's office [Dr. End). He has not done this. He tells me he is on Coumadino 5 mg twice a day. The last INR I see is from 5/11  when he was discharged from the hospital it was 2.1 however it was increasing every day they check this. He was thought to have cellulitis from methicillin-resistant staph aureus. 5/21; patient's wound on the right lower leg actually looks a lot better although not much change in size. We used Iodoflex under an Haematologist last week. The [employer] stayed in place. 07/20/2019 upon evaluation today patient actually appears to be doing much better in regard to his legs in general. He does not have the infection that was previously noted which is great news he was in the hospital which he finally did go after things got significantly worse. Fortunately he seems to be doing much better in regard to his overall appearance today in that regard. With that being said he still is experiencing issues currently with wounds over the lower extremities and his wraps do tend to slip down. We probably do need to use the Unna paste which I think we are doing here but I am not sure the home health nurses are using right now we may need to send this order along as well. 07/27/2019 upon evaluation today patient appears to be doing excellent in regard to his lower extremities all things considered. He still has significant edema noted but the compression wraps do seem to be helping and overall after getting his infection under good control I feel like things have improved dramatically. There  does not appear to be any signs of active infection at this time. No fevers, chills, nausea, vomiting, or diarrhea. Readmission: 10/30/2019 patient presents today for readmission here in the clinic concerning issues that he has been having with his bilateral lower extremities. He tells me after last time he was in the hospital he actually had improvement of his legs to everything got under much better control was actually doing quite well. Nonetheless right now he has blisters that reopened he tells me about a month ago and has not really been able to get them close. He does not have any compression stockings that he is wearing at this point he came in really been nothing on the wounds currently. Patient History Information obtained from Patient. Allergies No Known Drug Allergies Family History Cancer - Father,Mother, Heart Disease - Mother,Maternal Grandparents, Hypertension - Maternal Grandparents,Mother, No family history of Diabetes, Hereditary Spherocytosis, Kidney Disease, Lung Disease, Seizures, Stroke, Thyroid Problems, Tuberculosis. Social History Never smoker, Marital Status - Widowed, Alcohol Use - Rarely, Drug Use - No History, Caffeine Use - Daily. Medical History Respiratory Patient has history of Asthma, Chronic Obstructive Pulmonary Disease (COPD), Sleep Apnea Denies history of Aspiration, Pneumothorax, Tuberculosis Cardiovascular Patient has history of Arrhythmia - a fib, Congestive Heart Failure, Hypertension, Myocardial Infarction - 2010 Denies history of Angina, Coronary Artery Disease, Deep Vein Thrombosis, Hypotension, Peripheral Arterial Disease, Peripheral Venous Disease, Phlebitis, Vasculitis Endocrine Patient has history of Type II Diabetes Denies history of Type I Diabetes Immunological Denies history of Lupus Erythematosus, Raynaud s, Scleroderma Integumentary (Skin) Denies history of History of Burn, History of pressure wounds Musculoskeletal Denies history  of Gout, Rheumatoid Arthritis, Osteoarthritis, Osteomyelitis Neurologic Denies history of Dementia, Neuropathy, Quadriplegia, Paraplegia, Seizure Disorder Psychiatric Denies history of Anorexia/bulimia, Confinement Anxiety Medical And Surgical History Notes Respiratory chronic resp failure, PE Musculoskeletal back pain Neurologic TBI Review of Systems (ROS) Constitutional Symptoms (General Health) Denies complaints or symptoms of Fatigue, Fever, Chills, Marked Weight Change. Eyes Denies complaints or symptoms of Dry Eyes, Vision Changes, Glasses / Contacts. Ear/Nose/Mouth/Throat Denies complaints or symptoms of  Difficult clearing ears, Sinusitis. Hematologic/Lymphatic Denies complaints or symptoms of Bleeding / Clotting Disorders, Human Immunodeficiency Virus. LATRAIL, POUNDERS ([id number]) Gastrointestinal Denies complaints or symptoms of Frequent diarrhea, Nausea, Vomiting. Genitourinary Denies complaints or symptoms of Kidney failure/ Dialysis, Incontinence/dribbling. Immunological Denies complaints or symptoms of Hives, Itching. Integumentary (Skin) Complains or has symptoms of Wounds - bilateral lower extremity. Denies complaints or symptoms of Bleeding or bruising tendency, Breakdown, Swelling. Musculoskeletal Denies complaints or symptoms of Muscle Pain, Muscle Weakness. Neurologic Denies complaints or symptoms of Numbness/parasthesias, Focal/Weakness. Psychiatric Complains or has symptoms of Claustrophobia. Denies complaints or symptoms of Anxiety. Objective Constitutional sitting or standing blood pressure is within target range for patient.. pulse regular and within target range for patient.[name] respirations regular, non- labored and within target range for patient.[name] temperature within target range for patient.. Obese and well-hydrated in no acute distress. Vitals Time Taken: 12:50 PM, Height: 68 in, Weight: 400 lbs, BMI: 60.8, Temperature: 98.1 F, Pulse: 84 bpm,  Respiratory Rate: 18 breaths/min, Blood Pressure: 134/72 mmHg. Eyes conjunctiva clear no eyelid edema noted. pupils equal round and reactive to light and accommodation. Ears, Nose, Mouth, and Throat no gross abnormality of ear auricles or external auditory canals. normal hearing noted during conversation. mucus membranes moist. Respiratory normal breathing without difficulty. Cardiovascular 1+ dorsalis pedis/posterior tibialis pulses. 2+ pitting edema of the bilateral lower extremities. Musculoskeletal Patient unable to walk without assistance. no significant deformity or arthritic changes, no loss or range of motion, no clubbing. Psychiatric this patient is able to make decisions and demonstrates good insight into disease process. Alert and Oriented x 3. pleasant and cooperative. General Notes: Upon inspection patient's wounds appeared to be fairly superficial as far as the depth of the wound was concerned there also is no signs of active infection at this time which is great news and a dramatic improvement compared to last time I saw him where to be honest he had significant evidence of infection ended up having MRSA and ended up being hospitalized as a result even after refusing several times to go to the hospital when I wanted him to. In the end that they were able to get this under control which is great and today things are definitely not as bad as they were back at that point. Integumentary (Hair, Skin) Wound #5 status is Open. Original cause of wound was Gradually Appeared. The wound is located on the Left,Lateral Lower Leg. The wound measures 8cm length x 8cm width x 0.2cm depth; 50.265cm^2 area and 10.053cm^3 volume. There is Fat Layer (Subcutaneous Tissue) exposed. There is no tunneling or undermining noted. There is a large amount of serous drainage noted. The wound margin is flat and intact. There is medium (34-66%) pink granulation within the wound bed. There is a medium (34-66%)  amount of necrotic tissue within the wound bed including Adherent Slough. Wound #7 status is Healed - Epithelialized. Original cause of wound was Gradually Appeared. The wound is located on the Right,Anterior Lower Leg. The wound measures 0cm length x 0cm width x 0cm depth; 0cm^2 area and 0cm^3 volume. Wound #8 status is Healed - Epithelialized. Original cause of wound was Gradually Appeared. The wound is located on the Right,Proximal,Medial,Anterior Lower Leg. The wound measures 0cm length x 0cm width x 0cm depth; 0cm^2 area and 0cm^3 volume. Wound #9 status is Open. Original cause of wound was Gradually Appeared. The wound is located on the Right,Lateral Lower Leg. The wound measures 1.2cm length x 2cm width x 1cm depth; 1.885cm^2 area and  1.885cm^3 volume. There is Fat Layer (Subcutaneous Tissue) exposed. There is no tunneling or undermining noted. There is a large amount of serous drainage noted. The wound margin is flat and intact. There is small (1-33%) red granulation within the wound bed. There is a large (67-100%) amount of necrotic tissue within the wound bed including Adherent ROLIN, SCHULT ([id number]) [place]. 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 There was a Four Layer Compression Therapy Procedure by Grover Canavan, RN. Post procedure Diagnosis Wound #5: Same as Pre-Procedure Wound #9 Pre-procedure diagnosis of Wound #9 is a Lymphedema located on the Right,Lateral Lower Leg . There was a Four Layer Compression Therapy Procedure by Grover Canavan, RN. Post procedure Diagnosis Wound #9: Same as Pre-Procedure Plan Wound Cleansing: Wound #5 Left,Lateral Lower Leg: Dial antibacterial soap, wash wounds, rinse and pat dry prior to dressing wounds Wound #9 Right,Lateral Lower Leg: Dial antibacterial soap, wash wounds, rinse and pat dry prior to dressing wounds Primary Wound Dressing: Wound #5 Left,Lateral Lower Leg: Silver Alginate Wound #9 Right,Lateral Lower Leg: Silver Alginate Secondary Dressing: Wound #5 Left,Lateral Lower Leg: XtraSorb Wound #9 Right,Lateral Lower Leg: XtraSorb Dressing Change Frequency: Wound #5 Left,Lateral Lower Leg: Change dressing every week Wound #9 Right,Lateral Lower Leg: Change dressing every week Follow-up Appointments: Wound #5 Left,Lateral Lower Leg: Return Appointment in 1 week. Wound #9 Right,Lateral Lower Leg: Return Appointment in 1 week. Edema Control: Wound #5 Left,Lateral Lower Leg: 4 Layer Compression System - Bilateral RIKI, GEHRING ([id number]) Elevate legs to the level of the heart and pump ankles as often as possible Wound #9 Right,Lateral Lower Leg: 4 Layer Compression System - Bilateral Elevate legs to the level of the heart and pump ankles as often as possible 1. I would recommend currently that we go ahead and continue with the wound care measures as before specifically with regard to the silver alginate dressing which I think would likely be a good option for him. We will use Xtrasorb as well due to the fact that some of the wounds are draining quite a bit he tells me. 2. Also the night continue with the compression wrappings previously tolerated without complication. This is a 4-layer compression wrap. 3. We will also continue  to recommend elevate his legs he tells me that someone think has been doing to try to keep his edema under control nonetheless he still has fairly significant edema to be honest. We will see patient back for reevaluation in 1 week here in the clinic. If anything worsens or changes patient will contact our office for additional recommendations. Electronic Signature(s) Signed: 10/30/2019 2:28:41 PM By: Worthy Keeler PA-C Entered By: Worthy Keeler on 10/30/2019 14:28:41 ONA, RATHERT ([id number]) -------------------------------------------------------------------------------- ROS/PFSH Details Patient Name: [name] [name] Date of Service: 10/30/2019 12:45 PM Medical Record Number: [id number] Patient Account Number: [id number] Date of Birth/Sex: 01/30/1961 (59 y.o. M) Treating RN: Grover Canavan Primary Care Provider: Nobie Putnam Other Clinician: Referring Provider: Referral, Self Treating Provider/Extender: Melburn Hake, Patrizia Paule  Weeks in Treatment: 25 Information Obtained From Patient Constitutional Symptoms (General Health) Complaints and Symptoms: Negative for: Fatigue; Fever; Chills; Marked Weight Change Eyes Complaints and Symptoms: Negative for: Dry Eyes; Vision Changes; Glasses / Contacts Ear/Nose/Mouth/Throat Complaints and Symptoms: Negative for: Difficult clearing ears; Sinusitis Hematologic/Lymphatic Complaints and Symptoms: Negative for: Bleeding / Clotting Disorders; Human Immunodeficiency Virus Gastrointestinal Complaints and Symptoms: Negative for: Frequent diarrhea; Nausea; Vomiting Genitourinary Complaints and Symptoms: Negative for: Kidney failure/ Dialysis; Incontinence/dribbling Immunological Complaints and Symptoms: Negative for: Hives; Itching Medical History: Negative for: Lupus Erythematosus; Raynaudos; Scleroderma Integumentary (Skin) Complaints and Symptoms: Positive for: Wounds - bilateral lower extremity Negative for: Bleeding or  bruising tendency; Breakdown; Swelling Medical History: Negative for: History of Burn; History of pressure wounds Musculoskeletal Complaints and Symptoms: Negative for: Muscle Pain; Muscle Weakness Medical History: Negative for: Gout; Rheumatoid Arthritis; Osteoarthritis; Osteomyelitis Past Medical History Notes: back pain Puls, Chrissie Noa ([id number]) Neurologic Complaints and Symptoms: Negative for: Numbness/parasthesias; Focal/Weakness Medical History: Negative for: Dementia; Neuropathy; Quadriplegia; Paraplegia; Seizure Disorder Past Medical History Notes: TBI Psychiatric Complaints and Symptoms: Positive for: Claustrophobia Negative for: Anxiety Medical History: Negative for: Anorexia/bulimia; Confinement Anxiety Respiratory Medical History: Positive for: Asthma; Chronic Obstructive Pulmonary Disease (COPD); Sleep Apnea Negative for: Aspiration; Pneumothorax; Tuberculosis Past Medical History Notes: chronic resp failure, PE Cardiovascular Medical History: Positive for: Arrhythmia - a fib; Congestive Heart Failure; Hypertension; Myocardial Infarction - 2010 Negative for: Angina; Coronary Artery Disease; Deep Vein Thrombosis; Hypotension; Peripheral Arterial Disease; Peripheral Venous Disease; Phlebitis; Vasculitis Endocrine Medical History: Positive for: Type II Diabetes Negative for: Type I Diabetes Treated with: Diet Oncologic Immunizations Pneumococcal Vaccine: Received Pneumococcal Vaccination: No Implantable Devices None Family and Social History Cancer: Yes - Father,Mother; Diabetes: No; Heart Disease: Yes - Mother,Maternal Grandparents; Hereditary Spherocytosis: No; Hypertension: Yes - Maternal Grandparents,Mother; Kidney Disease: No; Lung Disease: No; Seizures: No; Stroke: No; Thyroid Problems: No; Tuberculosis: No; Never smoker; Marital Status - Widowed; Alcohol Use: Rarely; Drug Use: No History; Caffeine Use: Daily; Financial Concerns: No; Food,  Clothing or Shelter Needs: No; Support System Lacking: No; Transportation Concerns: No Electronic Signature(s) Signed: 10/30/2019 4:44:41 PM By: Grover Canavan Signed: 11/01/2019 5:15:44 PM By: Worthy Keeler PA-C Entered By: Grover Canavan on 10/30/2019 13:01:37 [name] [name] ([id number]) -------------------------------------------------------------------------------- SuperBill Details Patient Name: [name] [name] Date of Service: 10/30/2019 Medical Record Number: [id number] Patient Account Number: [id number] Date of Birth/Sex: [birth year] (59 y.o. M) Treating RN: Grover Canavan Primary Care Provider: Nobie Putnam Other Clinician: Referring Provider: Referral, Self Treating Provider/Extender: Melburn Hake, Yareli Carthen Weeks in Treatment: 25 Diagnosis Coding ICD-10 Codes Code Description I87.2 Venous insufficiency (chronic) (peripheral) I89.0 Lymphedema, not elsewhere classified L97.812 Non-pressure chronic ulcer of other part of right lower leg with fat layer exposed L97.822 Non-pressure chronic ulcer of other part of left lower leg with fat layer exposed I10 Essential (primary) hypertension J44.9 Chronic obstructive pulmonary disease, unspecified Z79.01 Long term (current) use of anticoagulants I48.0 Paroxysmal atrial fibrillation Z86.711 Personal history of pulmonary embolism I50.42 Chronic combined systolic (congestive) and diastolic (congestive) heart failure E11.622 Type 2 diabetes mellitus with other skin ulcer Facility Procedures CPT4: Description Modifier Quantity Code [id number] BILATERAL: Application of multi-layer venous compression system; leg (below knee), including 1 ankle and foot. Physician Procedures CPT4 Code: 2423536 Description: 14431 - WC PHYS LEVEL 3 - EST PT Modifier: Quantity: 1 CPT4 Code: Description: ICD-10 Diagnosis Description I87.2 Venous insufficiency (chronic) (peripheral) I89.0 Lymphedema, not elsewhere classified L97.812 Non-pressure  chronic ulcer of other part of right lower leg with fat la L97.822 Non-pressure  chronic ulcer of  other part of left lower leg with fat lay Modifier: yer exposed er exposed Quantity: Electronic Signature(s) Signed: 10/30/2019 2:29:32 PM By: Worthy Keeler PA-C Entered By: Worthy Keeler on 10/30/2019 14:29:31

## 2019-11-05 NOTE — Telephone Encounter (Signed)
Pt has been r/s  

## 2019-11-06 ENCOUNTER — Ambulatory Visit: Payer: Medicaid Other | Admitting: Physician Assistant

## 2019-11-07 ENCOUNTER — Emergency Department: Payer: Medicaid Other

## 2019-11-07 ENCOUNTER — Inpatient Hospital Stay: Payer: Medicaid Other

## 2019-11-07 ENCOUNTER — Other Ambulatory Visit: Payer: Self-pay | Admitting: *Deleted

## 2019-11-07 ENCOUNTER — Inpatient Hospital Stay
Admission: EM | Admit: 2019-11-07 | Discharge: 2019-11-10 | DRG: 291 | Disposition: A | Discharge status: Home / Self Care | Payer: Medicaid Other | Attending: Internal Medicine | Admitting: Internal Medicine

## 2019-11-07 ENCOUNTER — Other Ambulatory Visit: Payer: Self-pay

## 2019-11-07 DIAGNOSIS — Z86711 Personal history of pulmonary embolism: Secondary | ICD-10-CM | POA: Diagnosis not present

## 2019-11-07 DIAGNOSIS — R069 Unspecified abnormalities of breathing: Secondary | ICD-10-CM | POA: Diagnosis not present

## 2019-11-07 DIAGNOSIS — R42 Dizziness and giddiness: Secondary | ICD-10-CM | POA: Diagnosis present

## 2019-11-07 DIAGNOSIS — M109 Gout, unspecified: Secondary | ICD-10-CM | POA: Diagnosis present

## 2019-11-07 DIAGNOSIS — E785 Hyperlipidemia, unspecified: Secondary | ICD-10-CM | POA: Diagnosis present

## 2019-11-07 DIAGNOSIS — I2510 Atherosclerotic heart disease of native coronary artery without angina pectoris: Secondary | ICD-10-CM | POA: Diagnosis present

## 2019-11-07 DIAGNOSIS — F419 Anxiety disorder, unspecified: Secondary | ICD-10-CM | POA: Diagnosis present

## 2019-11-07 DIAGNOSIS — J9621 Acute and chronic respiratory failure with hypoxia: Secondary | ICD-10-CM | POA: Diagnosis present

## 2019-11-07 DIAGNOSIS — G4733 Obstructive sleep apnea (adult) (pediatric): Secondary | ICD-10-CM | POA: Diagnosis present

## 2019-11-07 DIAGNOSIS — Z8249 Family history of ischemic heart disease and other diseases of the circulatory system: Secondary | ICD-10-CM | POA: Diagnosis not present

## 2019-11-07 DIAGNOSIS — F329 Major depressive disorder, single episode, unspecified: Secondary | ICD-10-CM | POA: Diagnosis present

## 2019-11-07 DIAGNOSIS — E669 Obesity, unspecified: Secondary | ICD-10-CM | POA: Diagnosis present

## 2019-11-07 DIAGNOSIS — Z86718 Personal history of other venous thrombosis and embolism: Secondary | ICD-10-CM | POA: Diagnosis not present

## 2019-11-07 DIAGNOSIS — I2699 Other pulmonary embolism without acute cor pulmonale: Secondary | ICD-10-CM | POA: Diagnosis present

## 2019-11-07 DIAGNOSIS — I10 Essential (primary) hypertension: Secondary | ICD-10-CM | POA: Diagnosis present

## 2019-11-07 DIAGNOSIS — F1010 Alcohol abuse, uncomplicated: Secondary | ICD-10-CM | POA: Diagnosis present

## 2019-11-07 DIAGNOSIS — I252 Old myocardial infarction: Secondary | ICD-10-CM | POA: Diagnosis not present

## 2019-11-07 DIAGNOSIS — Z79899 Other long term (current) drug therapy: Secondary | ICD-10-CM | POA: Diagnosis not present

## 2019-11-07 DIAGNOSIS — I509 Heart failure, unspecified: Secondary | ICD-10-CM

## 2019-11-07 DIAGNOSIS — I4821 Permanent atrial fibrillation: Secondary | ICD-10-CM | POA: Diagnosis present

## 2019-11-07 DIAGNOSIS — Z801 Family history of malignant neoplasm of trachea, bronchus and lung: Secondary | ICD-10-CM

## 2019-11-07 DIAGNOSIS — I482 Chronic atrial fibrillation, unspecified: Secondary | ICD-10-CM | POA: Diagnosis present

## 2019-11-07 DIAGNOSIS — Z9989 Dependence on other enabling machines and devices: Secondary | ICD-10-CM | POA: Diagnosis not present

## 2019-11-07 DIAGNOSIS — Z79890 Hormone replacement therapy (postmenopausal): Secondary | ICD-10-CM

## 2019-11-07 DIAGNOSIS — I248 Other forms of acute ischemic heart disease: Secondary | ICD-10-CM | POA: Diagnosis present

## 2019-11-07 DIAGNOSIS — I4819 Other persistent atrial fibrillation: Secondary | ICD-10-CM | POA: Diagnosis not present

## 2019-11-07 DIAGNOSIS — R0789 Other chest pain: Secondary | ICD-10-CM | POA: Diagnosis not present

## 2019-11-07 DIAGNOSIS — J449 Chronic obstructive pulmonary disease, unspecified: Secondary | ICD-10-CM | POA: Diagnosis present

## 2019-11-07 DIAGNOSIS — R778 Other specified abnormalities of plasma proteins: Secondary | ICD-10-CM | POA: Diagnosis present

## 2019-11-07 DIAGNOSIS — F3341 Major depressive disorder, recurrent, in partial remission: Secondary | ICD-10-CM

## 2019-11-07 DIAGNOSIS — Z20822 Contact with and (suspected) exposure to covid-19: Secondary | ICD-10-CM | POA: Diagnosis present

## 2019-11-07 DIAGNOSIS — F32A Depression, unspecified: Secondary | ICD-10-CM | POA: Diagnosis present

## 2019-11-07 DIAGNOSIS — J441 Chronic obstructive pulmonary disease with (acute) exacerbation: Secondary | ICD-10-CM | POA: Diagnosis not present

## 2019-11-07 DIAGNOSIS — R7989 Other specified abnormal findings of blood chemistry: Secondary | ICD-10-CM | POA: Diagnosis present

## 2019-11-07 DIAGNOSIS — I5033 Acute on chronic diastolic (congestive) heart failure: Secondary | ICD-10-CM | POA: Diagnosis present

## 2019-11-07 DIAGNOSIS — I517 Cardiomegaly: Secondary | ICD-10-CM | POA: Diagnosis not present

## 2019-11-07 DIAGNOSIS — I4891 Unspecified atrial fibrillation: Secondary | ICD-10-CM | POA: Diagnosis not present

## 2019-11-07 DIAGNOSIS — E119 Type 2 diabetes mellitus without complications: Secondary | ICD-10-CM | POA: Diagnosis present

## 2019-11-07 DIAGNOSIS — I110 Hypertensive heart disease with heart failure: Principal | ICD-10-CM | POA: Diagnosis present

## 2019-11-07 DIAGNOSIS — M79662 Pain in left lower leg: Secondary | ICD-10-CM

## 2019-11-07 DIAGNOSIS — R0602 Shortness of breath: Secondary | ICD-10-CM | POA: Diagnosis not present

## 2019-11-07 DIAGNOSIS — I872 Venous insufficiency (chronic) (peripheral): Secondary | ICD-10-CM | POA: Diagnosis present

## 2019-11-07 DIAGNOSIS — Z7901 Long term (current) use of anticoagulants: Secondary | ICD-10-CM | POA: Diagnosis not present

## 2019-11-07 DIAGNOSIS — J9622 Acute and chronic respiratory failure with hypercapnia: Secondary | ICD-10-CM | POA: Diagnosis present

## 2019-11-07 DIAGNOSIS — J811 Chronic pulmonary edema: Secondary | ICD-10-CM | POA: Diagnosis not present

## 2019-11-07 DIAGNOSIS — R197 Diarrhea, unspecified: Secondary | ICD-10-CM | POA: Diagnosis present

## 2019-11-07 DIAGNOSIS — R079 Chest pain, unspecified: Secondary | ICD-10-CM | POA: Diagnosis present

## 2019-11-07 DIAGNOSIS — E039 Hypothyroidism, unspecified: Secondary | ICD-10-CM | POA: Diagnosis present

## 2019-11-07 DIAGNOSIS — G4489 Other headache syndrome: Secondary | ICD-10-CM | POA: Diagnosis not present

## 2019-11-07 DIAGNOSIS — E1169 Type 2 diabetes mellitus with other specified complication: Secondary | ICD-10-CM

## 2019-11-07 LAB — BLOOD GAS, VENOUS
Acid-Base Excess: 3.6 mmol/L — ABNORMAL HIGH (ref 0.0–2.0)
Bicarbonate: 29.6 mmol/L — ABNORMAL HIGH (ref 20.0–28.0)
O2 Saturation: 76.3 %
Patient temperature: 37
pCO2, Ven: 50 mmHg (ref 44.0–60.0)
pH, Ven: 7.38 (ref 7.250–7.430)
pO2, Ven: 42 mmHg (ref 32.0–45.0)

## 2019-11-07 LAB — CBC
HCT: 36.8 % — ABNORMAL LOW (ref 39.0–52.0)
Hemoglobin: 11.7 g/dL — ABNORMAL LOW (ref 13.0–17.0)
MCH: 29.6 pg (ref 26.0–34.0)
MCHC: 31.8 g/dL (ref 30.0–36.0)
MCV: 93.2 fL (ref 80.0–100.0)
Platelets: 256 10*3/uL (ref 150–400)
RBC: 3.95 MIL/uL — ABNORMAL LOW (ref 4.22–5.81)
RDW: 17.2 % — ABNORMAL HIGH (ref 11.5–15.5)
WBC: 7.6 10*3/uL (ref 4.0–10.5)
nRBC: 0 % (ref 0.0–0.2)

## 2019-11-07 LAB — URINE DRUG SCREEN, QUALITATIVE (ARMC ONLY)
Amphetamines, Ur Screen: NOT DETECTED
Barbiturates, Ur Screen: NOT DETECTED
Benzodiazepine, Ur Scrn: NOT DETECTED
Cannabinoid 50 Ng, Ur ~~LOC~~: POSITIVE — AB
Cocaine Metabolite,Ur ~~LOC~~: NOT DETECTED
MDMA (Ecstasy)Ur Screen: NOT DETECTED
Methadone Scn, Ur: NOT DETECTED
Opiate, Ur Screen: POSITIVE — AB
Phencyclidine (PCP) Ur S: NOT DETECTED
Tricyclic, Ur Screen: NOT DETECTED

## 2019-11-07 LAB — BRAIN NATRIURETIC PEPTIDE: B Natriuretic Peptide: 196.1 pg/mL — ABNORMAL HIGH (ref 0.0–100.0)

## 2019-11-07 LAB — COMPREHENSIVE METABOLIC PANEL
ALT: 17 U/L (ref 0–44)
AST: 35 U/L (ref 15–41)
Albumin: 3.2 g/dL — ABNORMAL LOW (ref 3.5–5.0)
Alkaline Phosphatase: 79 U/L (ref 38–126)
Anion gap: 14 (ref 5–15)
BUN: 5 mg/dL — ABNORMAL LOW (ref 6–20)
CO2: 26 mmol/L (ref 22–32)
Calcium: 8 mg/dL — ABNORMAL LOW (ref 8.9–10.3)
Chloride: 97 mmol/L — ABNORMAL LOW (ref 98–111)
Creatinine, Ser: 0.92 mg/dL (ref 0.61–1.24)
GFR calc Af Amer: >60 mL/min (ref >60)
GFR calc non Af Amer: >60 mL/min (ref >60)
Glucose, Bld: 149 mg/dL — ABNORMAL HIGH (ref 70–99)
Potassium: 3.2 mmol/L — ABNORMAL LOW (ref 3.5–5.1)
Sodium: 137 mmol/L (ref 135–145)
Total Bilirubin: 0.7 mg/dL (ref 0.3–1.2)
Total Protein: 7.3 g/dL (ref 6.5–8.1)

## 2019-11-07 LAB — TROPONIN I (HIGH SENSITIVITY)
Troponin I (High Sensitivity): 34 ng/L — ABNORMAL HIGH (ref <18)
Troponin I (High Sensitivity): 37 ng/L — ABNORMAL HIGH (ref <18)
Troponin I (High Sensitivity): 31 ng/L — ABNORMAL HIGH (ref <18)

## 2019-11-07 LAB — PROTIME-INR
INR: 1.8 — ABNORMAL HIGH (ref 0.8–1.2)
Prothrombin Time: 20 seconds — ABNORMAL HIGH (ref 11.4–15.2)

## 2019-11-07 LAB — SARS CORONAVIRUS 2 BY RT PCR (HOSPITAL ORDER, PERFORMED IN ~~LOC~~ HOSPITAL LAB): SARS Coronavirus 2: NEGATIVE

## 2019-11-07 MED ORDER — FOLIC ACID 1 MG PO TABS
1.0000 mg | ORAL_TABLET | Freq: Every day | ORAL | Status: DC
  Administered 2019-11-07 – 2019-11-10 (×4): 1 mg via ORAL
  Filled 2019-11-07 (×4): qty 1

## 2019-11-07 MED ORDER — METHYLPREDNISOLONE SODIUM SUCC 125 MG IJ SOLR
60.0000 mg | Freq: Two times a day (BID) | INTRAMUSCULAR | Status: DC
  Administered 2019-11-07 – 2019-11-09 (×4): 60 mg via INTRAVENOUS
  Filled 2019-11-07 (×4): qty 2

## 2019-11-07 MED ORDER — WARFARIN SODIUM 5 MG PO TABS
5.0000 mg | ORAL_TABLET | Freq: Once | ORAL | Status: AC
  Administered 2019-11-07: 5 mg via ORAL
  Filled 2019-11-07 (×2): qty 1

## 2019-11-07 MED ORDER — SODIUM CHLORIDE 0.9% FLUSH: 3.0000 mL | INTRAVENOUS | Status: DC | PRN

## 2019-11-07 MED ORDER — THIAMINE HCL 100 MG/ML IJ SOLN
100.0000 mg | Freq: Every day | INTRAMUSCULAR | Status: DC
  Filled 2019-11-07: qty 2

## 2019-11-07 MED ORDER — THIAMINE HCL 100 MG PO TABS
100.0000 mg | ORAL_TABLET | Freq: Every day | ORAL | Status: DC
  Administered 2019-11-07 – 2019-11-10 (×4): 100 mg via ORAL
  Filled 2019-11-07 (×4): qty 1

## 2019-11-07 MED ORDER — ONDANSETRON HCL 4 MG/2ML IJ SOLN
4.0000 mg | Freq: Once | INTRAMUSCULAR | Status: AC
  Administered 2019-11-07: 4 mg via INTRAVENOUS
  Filled 2019-11-07: qty 2

## 2019-11-07 MED ORDER — DM-GUAIFENESIN ER 30-600 MG PO TB12
1.0000 | ORAL_TABLET | Freq: Two times a day (BID) | ORAL | Status: DC | PRN
  Administered 2019-11-08: 1 via ORAL
  Filled 2019-11-07: qty 1

## 2019-11-07 MED ORDER — OXYCODONE-ACETAMINOPHEN 5-325 MG PO TABS
1.0000 | ORAL_TABLET | ORAL | Status: DC | PRN
  Administered 2019-11-07 – 2019-11-10 (×7): 1 via ORAL
  Filled 2019-11-07 (×7): qty 1

## 2019-11-07 MED ORDER — POTASSIUM CHLORIDE CRYS ER 20 MEQ PO TBCR
40.0000 meq | EXTENDED_RELEASE_TABLET | ORAL | Status: AC
  Administered 2019-11-07 (×2): 40 meq via ORAL
  Filled 2019-11-07 (×2): qty 2

## 2019-11-07 MED ORDER — MORPHINE SULFATE (PF) 4 MG/ML IV SOLN
4.0000 mg | Freq: Once | INTRAVENOUS | Status: AC
  Administered 2019-11-07: 4 mg via INTRAVENOUS
  Filled 2019-11-07: qty 1

## 2019-11-07 MED ORDER — IPRATROPIUM-ALBUTEROL 0.5-2.5 (3) MG/3ML IN SOLN
3.0000 mL | Freq: Once | RESPIRATORY_TRACT | Status: AC
  Administered 2019-11-07: 3 mL via RESPIRATORY_TRACT
  Filled 2019-11-07 (×2): qty 3

## 2019-11-07 MED ORDER — SODIUM CHLORIDE 0.9 % IV SOLN: 250.0000 mL | INTRAVENOUS | Status: DC | PRN

## 2019-11-07 MED ORDER — FUROSEMIDE 10 MG/ML IJ SOLN
60.0000 mg | Freq: Two times a day (BID) | INTRAMUSCULAR | Status: DC
  Administered 2019-11-07 – 2019-11-09 (×4): 60 mg via INTRAVENOUS
  Filled 2019-11-07 (×3): qty 6
  Filled 2019-11-07: qty 8

## 2019-11-07 MED ORDER — MONTELUKAST SODIUM 10 MG PO TABS
10.0000 mg | ORAL_TABLET | Freq: Every day | ORAL | Status: DC
  Administered 2019-11-07 – 2019-11-09 (×3): 10 mg via ORAL
  Filled 2019-11-07 (×3): qty 1

## 2019-11-07 MED ORDER — LORAZEPAM 2 MG/ML IJ SOLN
1.0000 mg | INTRAMUSCULAR | Status: DC | PRN
  Administered 2019-11-10: 1 mg via INTRAVENOUS
  Administered 2019-11-10: 2 mg via INTRAVENOUS
  Filled 2019-11-07 (×4): qty 1

## 2019-11-07 MED ORDER — TRAZODONE HCL 50 MG PO TABS
150.0000 mg | ORAL_TABLET | Freq: Every day | ORAL | Status: DC
  Administered 2019-11-07 – 2019-11-09 (×3): 150 mg via ORAL
  Filled 2019-11-07 (×3): qty 1

## 2019-11-07 MED ORDER — FUROSEMIDE 10 MG/ML IJ SOLN: 60.0000 mg | Freq: Two times a day (BID) | INTRAMUSCULAR | Status: DC

## 2019-11-07 MED ORDER — ALLOPURINOL 100 MG PO TABS
100.0000 mg | ORAL_TABLET | Freq: Every day | ORAL | Status: DC
  Administered 2019-11-07 – 2019-11-10 (×4): 100 mg via ORAL
  Filled 2019-11-07 (×5): qty 1

## 2019-11-07 MED ORDER — ALBUTEROL SULFATE (2.5 MG/3ML) 0.083% IN NEBU: 2.5000 mg | INHALATION_SOLUTION | RESPIRATORY_TRACT | Status: DC | PRN

## 2019-11-07 MED ORDER — LEVOTHYROXINE SODIUM 50 MCG PO TABS: 50.0000 ug | ORAL_TABLET | Freq: Every day | ORAL | Status: DC

## 2019-11-07 MED ORDER — METOPROLOL SUCCINATE ER 50 MG PO TB24
50.0000 mg | ORAL_TABLET | Freq: Every day | ORAL | Status: DC
  Administered 2019-11-07 – 2019-11-10 (×4): 50 mg via ORAL
  Filled 2019-11-07 (×4): qty 1

## 2019-11-07 MED ORDER — ACETAMINOPHEN 325 MG PO TABS: 650.0000 mg | ORAL_TABLET | Freq: Four times a day (QID) | ORAL | Status: DC | PRN

## 2019-11-07 MED ORDER — NITROGLYCERIN 0.4 MG SL SUBL: 0.4000 mg | SUBLINGUAL_TABLET | SUBLINGUAL | Status: DC | PRN

## 2019-11-07 MED ORDER — ATORVASTATIN CALCIUM 20 MG PO TABS
40.0000 mg | ORAL_TABLET | Freq: Every day | ORAL | Status: DC
  Administered 2019-11-07 – 2019-11-10 (×4): 40 mg via ORAL
  Filled 2019-11-07 (×4): qty 2

## 2019-11-07 MED ORDER — LORAZEPAM 2 MG/ML IJ SOLN
0.0000 mg | Freq: Four times a day (QID) | INTRAMUSCULAR | Status: AC
  Administered 2019-11-07 – 2019-11-09 (×3): 1 mg via INTRAVENOUS
  Filled 2019-11-07 (×2): qty 1

## 2019-11-07 MED ORDER — SODIUM CHLORIDE 0.9% FLUSH
3.0000 mL | Freq: Two times a day (BID) | INTRAVENOUS | Status: DC
  Administered 2019-11-07 – 2019-11-10 (×6): 3 mL via INTRAVENOUS

## 2019-11-07 MED ORDER — FUROSEMIDE 10 MG/ML IJ SOLN
40.0000 mg | Freq: Once | INTRAMUSCULAR | Status: AC
  Administered 2019-11-07: 40 mg via INTRAVENOUS
  Filled 2019-11-07: qty 4

## 2019-11-07 MED ORDER — LORAZEPAM 1 MG PO TABS: 1.0000 mg | ORAL_TABLET | ORAL | Status: DC | PRN

## 2019-11-07 MED ORDER — IPRATROPIUM-ALBUTEROL 0.5-2.5 (3) MG/3ML IN SOLN
3.0000 mL | RESPIRATORY_TRACT | Status: DC
  Administered 2019-11-07 – 2019-11-09 (×8): 3 mL via RESPIRATORY_TRACT
  Filled 2019-11-07 (×8): qty 3

## 2019-11-07 MED ORDER — ONDANSETRON HCL 4 MG/2ML IJ SOLN
4.0000 mg | Freq: Three times a day (TID) | INTRAMUSCULAR | Status: DC | PRN
  Administered 2019-11-07: 4 mg via INTRAVENOUS
  Filled 2019-11-07: qty 2

## 2019-11-07 MED ORDER — HYDRALAZINE HCL 20 MG/ML IJ SOLN: 5.0000 mg | INTRAMUSCULAR | Status: DC | PRN

## 2019-11-07 MED ORDER — LOPERAMIDE HCL 2 MG PO CAPS: 2.0000 mg | ORAL_CAPSULE | ORAL | Status: DC | PRN

## 2019-11-07 MED ORDER — LORAZEPAM 2 MG/ML IJ SOLN
0.0000 mg | Freq: Two times a day (BID) | INTRAMUSCULAR | Status: DC
  Administered 2019-11-10: 1 mg via INTRAVENOUS
  Filled 2019-11-07: qty 1

## 2019-11-07 MED ORDER — FERROUS SULFATE 325 (65 FE) MG PO TABS
325.0000 mg | ORAL_TABLET | Freq: Two times a day (BID) | ORAL | Status: DC
  Administered 2019-11-08 – 2019-11-10 (×5): 325 mg via ORAL
  Filled 2019-11-07 (×5): qty 1

## 2019-11-07 MED ORDER — DULOXETINE HCL 20 MG PO CPEP
20.0000 mg | ORAL_CAPSULE | Freq: Every day | ORAL | Status: DC
  Administered 2019-11-07 – 2019-11-10 (×4): 20 mg via ORAL
  Filled 2019-11-07 (×4): qty 1

## 2019-11-07 MED ORDER — LISINOPRIL 5 MG PO TABS
2.5000 mg | ORAL_TABLET | Freq: Every day | ORAL | Status: DC
  Administered 2019-11-07 – 2019-11-10 (×4): 2.5 mg via ORAL
  Filled 2019-11-07 (×4): qty 1

## 2019-11-07 MED ORDER — PROMETHAZINE HCL 25 MG/ML IJ SOLN
12.5000 mg | Freq: Once | INTRAMUSCULAR | Status: AC
  Administered 2019-11-08: 12.5 mg via INTRAVENOUS
  Filled 2019-11-07: qty 1

## 2019-11-07 MED ORDER — ADULT MULTIVITAMIN W/MINERALS CH
1.0000 | ORAL_TABLET | Freq: Every day | ORAL | Status: DC
  Administered 2019-11-07 – 2019-11-10 (×4): 1 via ORAL
  Filled 2019-11-07 (×4): qty 1

## 2019-11-07 MED ORDER — WARFARIN - PHARMACIST DOSING INPATIENT
Freq: Every day | Status: DC
  Filled 2019-11-07: qty 1

## 2019-11-07 NOTE — Progress Notes (Signed)
Cross Cover Note RN reports patient with left sided calf pain. Hx DVT and PE, on coumadin therapy with subtherapeutic INR. Ultrasound of extremity ordered

## 2019-11-07 NOTE — ED Triage Notes (Signed)
Pt here via [facility] from home emergency traffic.   Pt here c/o shortness of breath, tremors, congestions, and nausea starting at 6am. Pt placed on cpap with Ems satting at 90%. Pt uses cpap at home. Pt vomited x1 with ems and taken off of cpap.   Pt received x2 albuterol breathing tx, 125mg  solumedrol, 4mg  zofran.   Hx of afib. Afib on ekg with rate of 132, O2 sats 96% 4L [place], bp 146/95, RR 18.

## 2019-11-07 NOTE — Progress Notes (Signed)
ANTICOAGULATION CONSULT NOTE - Initial Consult  Pharmacy Consult for Warfarin Indication: atrial fibrillation  No Known Allergies  Patient Measurements: Height: 5\' 8"  (172.7 cm) Weight: (!) 186 kg (410 lb) IBW/kg (Calculated) : 68.4 Heparin Dosing Weight:    Vital Signs: Temp: 98.5 F (36.9 C) (09/22 1328) Temp Source: Oral (09/22 1328) BP: 148/93 (09/22 1430) Pulse Rate: 127 (09/22 1430)  Labs: Recent Labs    11/07/19 1323  HGB 11.7*  HCT 36.8*  PLT 256  LABPROT 20.0*  INR 1.8*  CREATININE 0.92  TROPONINIHS 34*    Estimated Creatinine Clearance: 141.1 mL/min (by C-G formula based on SCr of 0.92 mg/dL).   Medical History: Past Medical History:  Diagnosis Date  . Allergy   . Anxiety   . Arthritis   . Asthma   . Brain damage   . Chronic pain of both knees 07/13/2018  . Clotting disorder (HCC)   . COPD (chronic obstructive pulmonary disease) (HCC)   . Depression   . GERD (gastroesophageal reflux disease)   . HFrEF (heart failure with reduced ejection fraction) (HCC)    a. 03/2018 Echo: EF 25-30%, diff HK. Mod LAE.  04/2018 History of DVT (deep vein thrombosis)   . History of pulmonary embolism    a. Chronic coumadin.  [name] Hypertension   . MI (myocardial infarction) (HCC)   . Morbid obesity (HCC)   . Neck pain 07/13/2018  . NICM (nonischemic cardiomyopathy) (HCC)    a. s/p Cath x 3 - reportedly nl cors. Last cath 2019 in [place]; b. a. 03/2018 Echo: EF 25-30%, diff HK.  04/2018 Persistent atrial fibrillation (HCC)    a. 03/2018 s/p DCCV; b. CHA2DS2VASc = 1-->Xarelto (later changed to warfarin); c. 05/2018 recurrent afib-->Amio initiated.  . Sleep apnea   . Sleep apnea     Assessment: Patient is a 59yo male presenting with SOB. Patient with a history or afib and DVT/PE and takes Warfarin outpatient. Pharmacy consulted to manage warfarin dosing.   Home regime: 5mg  daily  Date       INR        Dose 9/22        1.8  Goal of Therapy:  INR 2-3   Plan:  INR is slightly below  goal. Will order Warfarin 5mg  times one. Daily INR checks to guide dosing.   , PharmD, BCPS 11/07/2019 5:00 PM

## 2019-11-07 NOTE — H&P (Signed)
History and Physical    [name] [name] VVZ:[id number] DOB: May 31, 1960 DOA: 11/07/2019  Referring MD/NP/PA:   PCP: [name], DO   Patient coming from:  The patient is coming from home.  At baseline, pt is independent for most of ADL.        Chief Complaint: SOB  HPI: [name] [name] is a 59 y.o. male with medical history significant of dCHF, hypertension, hyperlipidemia, diet-controlled diabetes, COPD, hypothyroidism, gout, depression, anxiety, OSA on CPAP, atrial fibrillation, PE/DVT on Coumadin, CAD, alcohol abuse, obesity, who presents with shortness of breath.  Patient states that she has been having shortness of breath in the past several days, which has been progressively worsening since this morning.  He has dry cough and chest pain.  The chest pain is located in left lower chest, dull, 8 out of 10 severity, nonradiating.  No fever or chills.  Patient has nausea, and vomited once with nonbilious nonbloody vomiting. He states that he has mild lower abdominal pain, no diarrhea.  No symptoms of UTI or unilateral weakness.  Patient has oxygen saturation 90% on CPAP per report.  ED Course: pt was found to have troponin 34, BMP 196, INR 1.8, negative Covid PCR, potassium 3.2, renal function okay, temperature normal, blood pressure 174/147, heart rate 119, RR 25, oxygen saturation 93-94% on 4 L oxygen.  Chest x-ray showed cardiomegaly and vascular congestion.  Patient is admitted to progressive bed as inpatient.  Review of Systems:   General: no fevers, chills, no body weight gain, has poor appetite, has fatigue HEENT: no blurry vision, hearing changes or sore throat Respiratory: has dyspnea, coughing, wheezing CV: has chest pain, no palpitations GI: has nausea, vomiting, abdominal pain, no diarrhea, constipation GU: no dysuria, burning on urination, increased urinary frequency, hematuria  Ext: has leg edema Neuro: no unilateral weakness, numbness, or tingling, no vision  change or hearing loss Skin: no rash, no skin tear. MSK: No muscle spasm, no deformity, no limitation of range of movement in spin Heme: No easy bruising.  Travel history: No recent long distant travel.  Allergy: No Known Allergies  Past Medical History:  Diagnosis Date  . Allergy   . Anxiety   . Arthritis   . Asthma   . Brain damage   . Chronic pain of both knees 07/13/2018  . Clotting disorder (HCC)   . COPD (chronic obstructive pulmonary disease) (HCC)   . Depression   . GERD (gastroesophageal reflux disease)   . HFrEF (heart failure with reduced ejection fraction) (HCC)    a. 03/2018 Echo: EF 25-30%, diff HK. Mod LAE.  [name] History of DVT (deep vein thrombosis)   . History of pulmonary embolism    a. Chronic coumadin.  [name] Hypertension   . MI (myocardial infarction) (HCC)   . Morbid obesity (HCC)   . Neck pain 07/13/2018  . NICM (nonischemic cardiomyopathy) (HCC)    a. s/p Cath x 3 - reportedly nl cors. Last cath 2019 in [place]; b. a. 03/2018 Echo: EF 25-30%, diff HK.  [name] Persistent atrial fibrillation (HCC)    a. 03/2018 s/p DCCV; b. CHA2DS2VASc = 1-->Xarelto (later changed to warfarin); c. 05/2018 recurrent afib-->Amio initiated.  . Sleep apnea   . Sleep apnea     Past Surgical History:  Procedure Laterality Date  . CARDIOVERSION N/A 03/24/2018   Procedure: CARDIOVERSION;  Surgeon: Antonieta Iba, MD;  Location: [facility] ORS;  Service: Cardiovascular;  Laterality: N/A;  . CARDIOVERSION N/A 08/08/2018   Procedure: CARDIOVERSION;  Surgeon: [name], MD;  Location: [facility] ORS;  Service: Cardiovascular;  Laterality: N/A;  . hearnia repair     X 3- total of two surgeries  . HERNIA REPAIR    . LEG SURGERY      Social History:  reports that he has never smoked. He has never used smokeless tobacco. He reports current alcohol use. He reports that he does not use drugs.  Family History:  Family History  Problem Relation Age of Onset  . Heart failure Mother   . Lung cancer Mother    . Lung cancer Father   . Heart attack Maternal Grandmother   . Heart attack Maternal Grandfather      Prior to Admission medications   Medication Sig Start Date End Date Taking? Authorizing Provider  albuterol (VENTOLIN HFA) 108 (90 Base) MCG/ACT inhaler Inhale 2 puffs into the lungs every 4 (four) hours as needed for wheezing or shortness of breath. 10/25/19   Karamalegos, Netta Neat, DO  allopurinol (ZYLOPRIM) 100 MG tablet TAKE 1 TABLET BY MOUTH ONCE DAILY 08/15/19   Flinchum, [name], FNP  atorvastatin (LIPITOR) 40 MG tablet TAKE 1 TABLET BY MOUTH ONCE DAILY 08/15/19   Flinchum, [name], FNP  DULoxetine (CYMBALTA) 20 MG capsule Take 1 capsule (20 mg total) by mouth daily. 10/25/19   Karamalegos, Netta Neat, DO  ferrous sulfate 325 (65 FE) MG tablet Take 1 tablet (325 mg total) by mouth 2 (two) times daily with a meal. 05/08/19   Flinchum, [name], FNP  fluticasone (FLONASE) 50 MCG/ACT nasal spray Place 2 sprays into both nostrils 2 (two) times daily. Patient not taking: Reported on 10/24/2019    [provider]  levothyroxine (SYNTHROID) 50 MCG tablet Take 1 tablet (50 mcg total) by mouth daily before breakfast. 10/25/19   Karamalegos, Netta Neat, DO  lisinopril (ZESTRIL) 2.5 MG tablet Take 1 tablet (2.5 mg total) by mouth daily. Patient not taking: Reported on 10/24/2019 05/08/19   Flinchum, [name], FNP  loperamide (IMODIUM) 2 MG capsule Take 1 capsule (2 mg total) by mouth as needed for diarrhea or loose stools. Patient not taking: Reported on 10/24/2019 06/26/19   Arnetha Courser, MD  metoprolol succinate (TOPROL-XL) 50 MG 24 hr tablet Take 1 tablet (50 mg total) by mouth daily. Take with or immediately following a meal. 06/06/19   End, Cristal Deer, MD  montelukast (SINGULAIR) 10 MG tablet Take 1 tablet (10 mg total) by mouth at bedtime. 10/12/19   Karamalegos, Netta Neat, DO  naphazoline-glycerin (CLEAR EYES REDNESS) 0.012-0.2 % SOLN Place 1-2 drops into both eyes 4 (four) times  daily as needed for eye irritation. Patient not taking: Reported on 10/24/2019 08/14/19   Alford Highland, MD  nitroGLYCERIN (NITROSTAT) 0.4 MG SL tablet Place 1 tablet under tongue every 5 minutes as needed for chest pain. (No more than 3 doses within 15 minutes) Patient not taking: Reported on 10/24/2019 03/08/19   Flinchum, [name], FNP  thiamine 100 MG tablet Take 1 tablet (100 mg total) by mouth daily. Patient not taking: Reported on 10/24/2019 08/14/19   Alford Highland, MD  torsemide (DEMADEX) 20 MG tablet Take 1 tablet (20 mg total) by mouth 2 (two) times daily. Patient taking differently: Take 40 mg by mouth 2 (two) times daily.  08/15/19   Alford Highland, MD  traZODone (DESYREL) 150 MG tablet Take 1 tablet (150 mg total) by mouth at bedtime. 10/25/19   Karamalegos, Netta Neat, DO  warfarin (COUMADIN) 5 MG tablet Take 1  tablet by mouth daily. 09/20/19   [provider]    Physical Exam: Vitals:   11/07/19 1328 11/07/19 1330 11/07/19 1415 11/07/19 1430  BP:  (!) 174/147  (!) 148/93  Pulse: (!) 119 91 75 (!) 127  Resp: (!) 25 (!) 21 15 19   Temp: 98.5 F (36.9 C)     TempSrc: Oral     SpO2: 94% 93% 94% 93%  Weight:      Height:       General: Not in acute distress HEENT:       Eyes: PERRL, EOMI, no scleral icterus.       ENT: No discharge from the ears and nose, no pharynx injection, no tonsillar enlargement.        Neck: No JVD, no bruit, no mass felt. Heme: No neck lymph node enlargement. Cardiac: S1/S2, RRR, No murmurs, No gallops or rubs. Respiratory:  has fine crackles and rhonchi on auscultation GI: Soft, nondistended, nontender, no rebound pain, no organomegaly, BS present. GU: No hematuria Ext: 1+ pitting leg edema bilaterally. 1+DP/PT pulse bilaterally. Musculoskeletal: No joint deformities, No joint redness or warmth, no limitation of ROM in spin. Skin: No rashes.  Neuro: Alert, oriented X3, cranial nerves II-XII grossly intact, moves all extremities normally.   Psych: Patient is not psychotic, no suicidal or hemocidal ideation.  Labs on Admission: I have personally reviewed following labs and imaging studies  CBC: Recent Labs  Lab 11/07/19 1323  WBC 7.6  HGB 11.7*  HCT 36.8*  MCV 93.2  PLT 256   Basic Metabolic Panel: Recent Labs  Lab 11/07/19 1323  NA 137  K 3.2*  CL 97*  CO2 26  GLUCOSE 149*  BUN 5*  CREATININE 0.92  CALCIUM 8.0*   GFR: Estimated Creatinine Clearance: 141.1 mL/min (by C-G formula based on SCr of 0.92 mg/dL). Liver Function Tests: Recent Labs  Lab 11/07/19 1323  AST 35  ALT 17  ALKPHOS 79  BILITOT 0.7  PROT 7.3  ALBUMIN 3.2*   No results for input(s): LIPASE, AMYLASE in the last 168 hours. No results for input(s): AMMONIA in the last 168 hours. Coagulation Profile: Recent Labs  Lab 11/07/19 1323  INR 1.8*   Cardiac Enzymes: No results for input(s): CKTOTAL, CKMB, CKMBINDEX, TROPONINI in the last 168 hours. BNP (last 3 results) No results for input(s): PROBNP in the last 8760 hours. HbA1C: No results for input(s): HGBA1C in the last 72 hours. CBG: No results for input(s): GLUCAP in the last 168 hours. Lipid Profile: No results for input(s): CHOL, HDL, LDLCALC, TRIG, CHOLHDL, LDLDIRECT in the last 72 hours. Thyroid Function Tests: No results for input(s): TSH, T4TOTAL, FREET4, T3FREE, THYROIDAB in the last 72 hours. Anemia Panel: No results for input(s): VITAMINB12, FOLATE, FERRITIN, TIBC, IRON, RETICCTPCT in the last 72 hours. Urine analysis:    Component Value Date/Time   COLORURINE YELLOW (A) 08/30/2019 0325   APPEARANCEUR HAZY (A) 08/30/2019 0325   APPEARANCEUR Hazy (A) 12/25/2018 1442   LABSPEC 1.014 08/30/2019 0325   PHURINE 7.0 08/30/2019 0325   GLUCOSEU NEGATIVE 08/30/2019 0325   HGBUR MODERATE (A) 08/30/2019 0325   BILIRUBINUR NEGATIVE 08/30/2019 0325   BILIRUBINUR Negative 12/25/2018 1442   KETONESUR NEGATIVE 08/30/2019 0325   PROTEINUR 30 (A) 08/30/2019 0325   NITRITE  NEGATIVE 08/30/2019 0325   LEUKOCYTESUR SMALL (A) 08/30/2019 0325   Sepsis Labs: @LABRCNTIP (procalcitonin:4,lacticidven:4) ) Recent Results (from the past 240 hour(s))  SARS Coronavirus 2 by RT PCR (hospital order, performed in [facility] hospital lab) Nasopharyngeal Nasopharyngeal Swab     Status: None   Collection Time: 11/07/19  1:23 PM   Specimen: Nasopharyngeal Swab  Result Value Ref Range Status   SARS Coronavirus 2 NEGATIVE NEGATIVE Final    Comment: (NOTE) SARS-CoV-2 target nucleic acids are NOT DETECTED.  The SARS-CoV-2 RNA is generally detectable in upper and lower respiratory specimens during the acute phase of infection. The lowest concentration of SARS-CoV-2 viral copies this assay can detect is 250 copies / mL. A negative result does not preclude SARS-CoV-2 infection and should not be used as the sole basis for treatment or other patient management decisions.  A negative result may occur with improper specimen collection / handling, submission of specimen other than nasopharyngeal swab, presence of viral mutation(s) within the areas targeted by this assay, and inadequate number of viral copies (<250 copies / mL). A negative result must be combined with clinical observations, patient history, and epidemiological information.  Fact Sheet for Patients:   BoilerBrush.com.cy  Fact Sheet for Healthcare Providers: https://pope.com/  This test is not yet approved or  cleared by the [place] FDA and has been authorized for detection and/or diagnosis of SARS-CoV-2 by FDA under an Emergency Use Authorization (EUA).  This EUA will remain in effect (meaning this test can be used) for the duration of the COVID-19 declaration under Section 564(b)(1) of the Act, 21 U.S.C. section 360bbb-3(b)(1), unless the authorization is terminated or revoked sooner.  Performed at [facility], [address]  [place], [place] [address]      Radiological Exams on Admission: DG Chest [facility] 1 View  Result Date: 11/07/2019 CLINICAL DATA:  Shortness of breath EXAM: PORTABLE CHEST 1 VIEW COMPARISON:  September 01, 2019 FINDINGS: There is cardiomegaly with pulmonary venous hypertension. No edema or airspace opacity. No adenopathy. No bone lesions. IMPRESSION: Cardiomegaly with pulmonary vascular congestion. No edema or airspace consolidation. Electronically Signed   By: Bretta Bang III M.D.   On: 11/07/2019 13:37     EKG: Independently reviewed.  Atrial fibrillation, low voltage, poor R wave progression, PVC.   Assessment/Plan Principal Problem:   Acute on chronic respiratory failure with hypoxia (HCC) Active Problems:   Acute on chronic diastolic CHF (congestive heart failure) (HCC)   OSA on CPAP   Persistent atrial fibrillation (HCC)   HTN (hypertension)   Personal history of DVT (deep vein thrombosis)   Chest pain   PE (pulmonary thromboembolism) (HCC)   HLD (hyperlipidemia)   CAD (coronary artery disease)   Hypothyroidism   Depression   Elevated troponin   Alcohol abuse   COPD exacerbation (HCC)   Acute on chronic respiratory failure with hypoxia (HCC): Likely due to combination of CHF and COPD exacerbation.  Patient has bilateral leg edema, elevated BNP 196, chest x-ray showed cardiomegaly and vascular congestion, clinically consistent with CHF exacerbation.  Patient has rhonchi on auscultation on my examination.  Per ED physician, patient has wheezing earlier, which has resolved currently.  Clinically consistent with COPD exacerbation.  -Admitted to progressive bed as inpatient -Bronchodilators -IV Lasix -Nasal cannula oxygen to maintain oxygen saturation above 93%  Acute on chronic diastolic CHF (congestive heart failure) (HCC): 2D echo on 06/23/2019 showed EF of 55 to 60% with grade 1 diastolic dysfunction. -Lasix 60 mg bid by IV -Will not get a 2D echo since patient had a 2D echo  recently -Daily weights -strict I/O's -Low salt diet -Fluid restriction -Obtain REDs Vest reading  COPD exacerbation: -Bronchodilators -As needed  Mucinex -Solu-Medrol 60 mg twice daily  Elevated troponin and chest pain and history of CAD: Troponin 34.  Likely due to demand ischemia. -Trend troponin -As needed nitroglycerin -Continue Lipitor -Check UDS, A1c, FLP -Repeat EKG in morning  OSA - on CPAP  Persistent atrial fibrillation (HCC) -Metoprolol -Coumadin per pharm  HTN (hypertension) -IV hydralazine as needed -Metoprolol, lisinopril  Personal history of DVT (deep vein thrombosis) and PE: On Coumadin, INR 1.8 -Coumadin per pharm  HLD (hyperlipidemia) -Lipitor  Hypothyroidism -Synthroid  Depression -Cymbalta  Alcohol abuse -CIWA protocol    DVT ppx: On Coumadin Code Status: Full code Family Communication: not done, no family member is at bed side Disposition Plan:  Anticipate discharge back to previous environment Consults called: None Admission status:  progressive unit as inpt     Status is: Inpatient  Remains inpatient appropriate because:Inpatient level of care appropriate due to severity of illness.  Patient has multiple comorbidities, now presents with acute on chronic respiratory failure with hypoxia due to combination of CHF and COPD exacerbation.  Patient also has chest pain, elevated troponin.  His presentation is highly complicated.  Patient is at high risk of deteriorating.  Need to be treated in hospital for at least 2 days.   Dispo: The patient is from: Home              Anticipated d/c is to: Home              Anticipated d/c date is: 2 days              Patient currently is not medically stable to d/c.             Date of Service 11/07/2019    Lorretta Harp [facility]   If 7PM-7AM, please contact night-coverage [email or web address] 11/07/2019, 6:25 PM

## 2019-11-07 NOTE — ED Notes (Signed)
Pt given coffee and [place] sandwich tray.

## 2019-11-07 NOTE — ED Notes (Signed)
Hospital bed requested for patient.

## 2019-11-07 NOTE — ED Provider Notes (Addendum)
[facility] Emergency Department Provider Note   ____________________________________________    I have reviewed the triage vital signs and the nursing notes.   HISTORY  Chief Complaint Shortness of Breath     HPI [name] [name] is a 59 y.o. male with a history of COPD, CHF, obesity, MI, PE who presents with complaints of shortness of breath.  EMS reports significant shortness of breath upon arrival satting 88 to 90%, started CPAP patient with nausea and vomiting at the time had to remove CPAP and then reapplied.  Did receive Solu-Medrol and Zofran and albuterol from EMS.  Patient denies chest pain, some mild chest tightness, no fevers or chills.  Past Medical History:  Diagnosis Date  . Allergy   . Anxiety   . Arthritis   . Asthma   . Brain damage   . Chronic pain of both knees 07/13/2018  . Clotting disorder (HCC)   . COPD (chronic obstructive pulmonary disease) (HCC)   . Depression   . GERD (gastroesophageal reflux disease)   . HFrEF (heart failure with reduced ejection fraction) (HCC)    a. 03/2018 Echo: EF 25-30%, diff HK. Mod LAE.  [name] History of DVT (deep vein thrombosis)   . History of pulmonary embolism    a. Chronic coumadin.  [name] Hypertension   . MI (myocardial infarction) (HCC)   . Morbid obesity (HCC)   . Neck pain 07/13/2018  . NICM (nonischemic cardiomyopathy) (HCC)    a. s/p Cath x 3 - reportedly nl cors. Last cath 2019 in [place]; b. a. 03/2018 Echo: EF 25-30%, diff HK.  [name] Persistent atrial fibrillation (HCC)    a. 03/2018 s/p DCCV; b. CHA2DS2VASc = 1-->Xarelto (later changed to warfarin); c. 05/2018 recurrent afib-->Amio initiated.  . Sleep apnea   . Sleep apnea     Patient Active Problem List   Diagnosis Date Noted  . COPD exacerbation (HCC) 11/07/2019  . Acute on chronic diastolic (congestive) heart failure (HCC) 11/07/2019  . GAD (generalized anxiety disorder) 10/12/2019  . Alcohol withdrawal syndrome without complication (HCC)    . Lower extremity ulceration, unspecified laterality, with fat layer exposed (HCC) 08/09/2019  . Alcohol abuse   . Pressure injury of skin 06/26/2019  . Iron deficiency anemia 06/22/2019  . Depression 06/22/2019  . Left-sided Bell's palsy 05/08/2019  . Chronic intractable headache 05/03/2019  . Head injuries, initial encounter 05/03/2019  . Noncompliance by refusing service 05/03/2019  . Facial droop 05/02/2019  . Pharmacologic therapy 04/11/2019  . Disorder of skeletal system 04/11/2019  . Problems influencing health status 04/11/2019  . Chronic anticoagulation (warfarin  COUMADIN) 04/11/2019  . Elevated sed rate 04/11/2019  . Elevated C-reactive protein (CRP) 04/11/2019  . Elevated hemoglobin A1c 04/11/2019  . Hypoalbuminemia 04/11/2019  . Edema due to hypoalbuminemia 04/11/2019  . Elevated brain natriuretic peptide (BNP) level 04/11/2019  . Chronic hip pain (Right) 04/11/2019  . Osteoarthritis of hip (Right) 04/11/2019  . Degenerative joint disease of right hip 03/05/2019  . Hypothyroidism 03/04/2019  . Chronic venous stasis dermatitis of both lower extremities 03/04/2019  . Long term (current) use of anticoagulants 02/23/2019  . PE (pulmonary thromboembolism) (HCC) 01/21/2019  . HLD (hyperlipidemia) 01/21/2019  . CAD (coronary artery disease) 01/21/2019  . Noncompliance 01/09/2019  . Acquired thrombophilia (HCC) 11/28/2018  . Major depressive disorder, recurrent episode, in partial remission (HCC) 09/17/2018  . Personal history of DVT (deep vein thrombosis) 07/13/2018  . History of pulmonary embolism (on Coumadin) 07/13/2018  . Neck  pain 07/13/2018  . Chronic low back pain (Bilateral)  w/ sciatica (Bilateral) 07/13/2018  . TBI (traumatic brain injury) (HCC) 07/04/2018  . Abnormal thyroid blood test 06/27/2018  . Type 2 diabetes mellitus with other specified complication (HCC) 06/06/2018  . HTN (hypertension) 06/06/2018  . Chronic gout involving toe, unspecified cause,  unspecified laterality 06/06/2018  . Morbid obesity with BMI of 60.0-69.9, adult (HCC) 06/06/2018  . Chronic pain syndrome 06/06/2018  . Ulcers of both lower extremities, limited to breakdown of skin (HCC) 06/06/2018  . Gout 06/06/2018  . Persistent atrial fibrillation (HCC)   . Acute on chronic diastolic CHF (congestive heart failure) (HCC) 02/02/2018  . Centrilobular emphysema (HCC) 02/02/2018  . OSA on CPAP 02/02/2018  . Lymphedema 02/02/2018  . Acute on chronic respiratory failure with hypoxia (HCC) 01/27/2018    Past Surgical History:  Procedure Laterality Date  . CARDIOVERSION N/A 03/24/2018   Procedure: CARDIOVERSION;  Surgeon: Antonieta Iba, MD;  Location: [facility] ORS;  Service: Cardiovascular;  Laterality: N/A;  . CARDIOVERSION N/A 08/08/2018   Procedure: CARDIOVERSION;  Surgeon: [name], MD;  Location: [facility] ORS;  Service: Cardiovascular;  Laterality: N/A;  . hearnia repair     X 3- total of two surgeries  . HERNIA REPAIR    . LEG SURGERY      Prior to Admission medications   Medication Sig Start Date End Date Taking? Authorizing Provider  albuterol (VENTOLIN HFA) 108 (90 Base) MCG/ACT inhaler Inhale 2 puffs into the lungs every 4 (four) hours as needed for wheezing or shortness of breath. 10/25/19   Karamalegos, Netta Neat, DO  allopurinol (ZYLOPRIM) 100 MG tablet TAKE 1 TABLET BY MOUTH ONCE DAILY 08/15/19   Flinchum, [name], FNP  atorvastatin (LIPITOR) 40 MG tablet TAKE 1 TABLET BY MOUTH ONCE DAILY 08/15/19   Flinchum, [name], FNP  DULoxetine (CYMBALTA) 20 MG capsule Take 1 capsule (20 mg total) by mouth daily. 10/25/19   Karamalegos, Netta Neat, DO  ferrous sulfate 325 (65 FE) MG tablet Take 1 tablet (325 mg total) by mouth 2 (two) times daily with a meal. 05/08/19   Flinchum, [name], FNP  fluticasone (FLONASE) 50 MCG/ACT nasal spray Place 2 sprays into both nostrils 2 (two) times daily. Patient not taking: Reported on 10/24/2019    [provider]   levothyroxine (SYNTHROID) 50 MCG tablet Take 1 tablet (50 mcg total) by mouth daily before breakfast. 10/25/19   Karamalegos, Netta Neat, DO  lisinopril (ZESTRIL) 2.5 MG tablet Take 1 tablet (2.5 mg total) by mouth daily. Patient not taking: Reported on 10/24/2019 05/08/19   Flinchum, [name], FNP  loperamide (IMODIUM) 2 MG capsule Take 1 capsule (2 mg total) by mouth as needed for diarrhea or loose stools. Patient not taking: Reported on 10/24/2019 06/26/19   Arnetha Courser, MD  metoprolol succinate (TOPROL-XL) 50 MG 24 hr tablet Take 1 tablet (50 mg total) by mouth daily. Take with or immediately following a meal. 06/06/19   End, Cristal Deer, MD  montelukast (SINGULAIR) 10 MG tablet Take 1 tablet (10 mg total) by mouth at bedtime. 10/12/19   Karamalegos, Netta Neat, DO  naphazoline-glycerin (CLEAR EYES REDNESS) 0.012-0.2 % SOLN Place 1-2 drops into both eyes 4 (four) times daily as needed for eye irritation. Patient not taking: Reported on 10/24/2019 08/14/19   Alford Highland, MD  nitroGLYCERIN (NITROSTAT) 0.4 MG SL tablet Place 1 tablet under tongue every 5 minutes as needed for chest pain. (No more than 3 doses within 15 minutes)  Patient not taking: Reported on 10/24/2019 03/08/19   Flinchum, [name], FNP  thiamine 100 MG tablet Take 1 tablet (100 mg total) by mouth daily. Patient not taking: Reported on 10/24/2019 08/14/19   Alford Highland, MD  torsemide (DEMADEX) 20 MG tablet Take 1 tablet (20 mg total) by mouth 2 (two) times daily. Patient taking differently: Take 40 mg by mouth 2 (two) times daily.  08/15/19   Alford Highland, MD  traZODone (DESYREL) 150 MG tablet Take 1 tablet (150 mg total) by mouth at bedtime. 10/25/19   Karamalegos, Netta Neat, DO  warfarin (COUMADIN) 5 MG tablet Take 1 tablet by mouth daily. 09/20/19   [provider]     Allergies Patient has no known allergies.  Family History  Problem Relation Age of Onset  . Heart failure Mother   . Lung cancer Mother   .  Lung cancer Father   . Heart attack Maternal Grandmother   . Heart attack Maternal Grandfather     Social History Social History   Tobacco Use  . Smoking status: Never Smoker  . Smokeless tobacco: Never Used  Vaping Use  . Vaping Use: Never used  Substance Use Topics  . Alcohol use: Yes  . Drug use: Never    Review of Systems  Constitutional: No fever/chills Eyes: No visual changes.  ENT: No sore throat. Cardiovascular: As above Respiratory: As above Gastrointestinal: No abdominal pain.  No nausea, no vomiting.   Genitourinary: Negative for dysuria. Musculoskeletal: Negative for back pain. Skin: Negative for rash. Neurological: Negative for headaches    ____________________________________________   PHYSICAL EXAM:  VITAL SIGNS: ED Triage Vitals  Enc Vitals Group     BP 11/07/19 1330 (!) 174/147     Pulse Rate 11/07/19 1328 (!) 119     Resp 11/07/19 1328 (!) 25     Temp 11/07/19 1328 98.5 F (36.9 C)     Temp Source 11/07/19 1328 Oral     SpO2 11/07/19 1328 94 %     Weight 11/07/19 1320 (!) 186 kg (410 lb)     Height 11/07/19 1320 1.727 m (5\' 8" )     Head Circumference --      Peak Flow --      Pain Score 11/07/19 1320 9     Pain Loc --      Pain Edu? --      Excl. in GC? --     Constitutional: Alert and oriented  Nose: No congestion/rhinnorhea. Mouth/Throat: Mucous membranes are moist.    Cardiovascular: Normal rate, regular rhythm.  Good peripheral circulation. Respiratory: Increased respiratory effort.  No retractions.  Bibasilar rales, diffuse wheezing Gastrointestinal: Soft and nontender. No distention.    Musculoskeletal:  Warm and well perfused Neurologic:  Normal speech and language. No gross focal neurologic deficits are appreciated.  Skin:  Skin is warm, dry and intact. No rash noted. Psychiatric: Mood and affect are normal. Speech and behavior are normal.  ____________________________________________   LABS (all labs ordered are  listed, but only abnormal results are displayed)  Labs Reviewed  BRAIN NATRIURETIC PEPTIDE - Abnormal; Notable for the following components:      Result Value   B Natriuretic Peptide 196.1 (*)    All other components within normal limits  CBC - Abnormal; Notable for the following components:   RBC 3.95 (*)    Hemoglobin 11.7 (*)    HCT 36.8 (*)    RDW 17.2 (*)    All other components within  normal limits  COMPREHENSIVE METABOLIC PANEL - Abnormal; Notable for the following components:   Potassium 3.2 (*)    Chloride 97 (*)    Glucose, Bld 149 (*)    BUN 5 (*)    Calcium 8.0 (*)    Albumin 3.2 (*)    All other components within normal limits  BLOOD GAS, VENOUS - Abnormal; Notable for the following components:   Bicarbonate 29.6 (*)    Acid-Base Excess 3.6 (*)    All other components within normal limits  TROPONIN I (HIGH SENSITIVITY) - Abnormal; Notable for the following components:   Troponin I (High Sensitivity) 34 (*)    All other components within normal limits  SARS CORONAVIRUS 2 BY RT PCR (HOSPITAL ORDER, PERFORMED IN [facility])  PROTIME-INR   ____________________________________________  EKG  ED ECG REPORT I, Jene Every, the attending physician, personally viewed and interpreted this ECG.  Date: 11/07/2019  Rhythm: Atrial fibrillation QRS Axis: normal Intervals: normal ST/T Wave abnormalities: normal Narrative Interpretation: no evidence of acute ischemia  ____________________________________________  RADIOLOGY  Chest x-ray reviewed by me, cardiomegaly noted, vascular congestion, possible edema noted. ____________________________________________   PROCEDURES  Procedure(s) performed: No  .1-3 Lead EKG Interpretation Performed by: Jene Every, MD Authorized by: Jene Every, MD     Interpretation: normal     ECG rate assessment: tachycardic     Rhythm: atrial fibrillation     Ectopy: none     Conduction: normal        Critical Care performed: No ____________________________________________   INITIAL IMPRESSION / ASSESSMENT AND PLAN / ED COURSE  Pertinent labs & imaging results that were available during my care of the patient were reviewed by me and considered in my medical decision making (see chart for details).  Patient presents with shortness of breath as noted above in the setting of atrial fibrillation on Eliquis, with a history of COPD, CHF, PE.  Strongly suspicious for COPD/CHF given crackles and wheezing.  Chest x-ray consistent with mild edema  Treated with IV Lasix, DuoNeb's.  Patient received Solu-Medrol with EMS.  Able to wean patient off CPAP and onto nasal cannula.  Lab work notable for chronically mildly elevated troponin of 34  Reevaluate after treatment, patient still with marked shortness of breath with any exertion, will require admission, have discussed with the hospital service      ____________________________________________   FINAL CLINICAL IMPRESSION(S) / ED DIAGNOSES  Final diagnoses:  COPD exacerbation (HCC)  Acute on chronic congestive heart failure, unspecified heart failure type [facility])        Note:  This document was prepared using Dragon voice recognition software and may include unintentional dictation errors.   Jene Every, MD 11/07/19 1521    Jene Every, MD 11/07/19 [phone]

## 2019-11-07 NOTE — ED Notes (Signed)
Pt give x2 apple sauce.

## 2019-11-07 NOTE — ED Notes (Signed)
Pt complaining of L calf pain. NP Jon Billings notified. No swelling present. Pt states pain is similar to blood clot. See order for [place] of lower extremity.

## 2019-11-08 ENCOUNTER — Telehealth: Payer: Medicaid Other

## 2019-11-08 ENCOUNTER — Other Ambulatory Visit: Payer: Medicaid Other

## 2019-11-08 DIAGNOSIS — I5033 Acute on chronic diastolic (congestive) heart failure: Secondary | ICD-10-CM

## 2019-11-08 DIAGNOSIS — J9621 Acute and chronic respiratory failure with hypoxia: Secondary | ICD-10-CM

## 2019-11-08 DIAGNOSIS — J441 Chronic obstructive pulmonary disease with (acute) exacerbation: Secondary | ICD-10-CM

## 2019-11-08 DIAGNOSIS — F1010 Alcohol abuse, uncomplicated: Secondary | ICD-10-CM

## 2019-11-08 LAB — BASIC METABOLIC PANEL
Anion gap: 12 (ref 5–15)
BUN: 10 mg/dL (ref 6–20)
CO2: 27 mmol/L (ref 22–32)
Calcium: 8 mg/dL — ABNORMAL LOW (ref 8.9–10.3)
Chloride: 94 mmol/L — ABNORMAL LOW (ref 98–111)
Creatinine, Ser: 0.99 mg/dL (ref 0.61–1.24)
GFR calc Af Amer: >60 mL/min (ref >60)
GFR calc non Af Amer: >60 mL/min (ref >60)
Glucose, Bld: 193 mg/dL — ABNORMAL HIGH (ref 70–99)
Potassium: 4.4 mmol/L (ref 3.5–5.1)
Sodium: 133 mmol/L — ABNORMAL LOW (ref 135–145)

## 2019-11-08 LAB — LIPID PANEL
Cholesterol: 175 mg/dL (ref 0–200)
HDL: 75 mg/dL (ref >40)
LDL Cholesterol: 87 mg/dL (ref 0–99)
Total CHOL/HDL Ratio: 2.3 RATIO
Triglycerides: 63 mg/dL (ref <150)
VLDL: 13 mg/dL (ref 0–40)

## 2019-11-08 LAB — HEMOGLOBIN A1C
Hgb A1c MFr Bld: 6.2 % — ABNORMAL HIGH (ref 4.8–5.6)
Mean Plasma Glucose: 131.24 mg/dL

## 2019-11-08 LAB — CBC
HCT: 35 % — ABNORMAL LOW (ref 39.0–52.0)
Hemoglobin: 11.7 g/dL — ABNORMAL LOW (ref 13.0–17.0)
MCH: 30.4 pg (ref 26.0–34.0)
MCHC: 33.4 g/dL (ref 30.0–36.0)
MCV: 90.9 fL (ref 80.0–100.0)
Platelets: 235 10*3/uL (ref 150–400)
RBC: 3.85 MIL/uL — ABNORMAL LOW (ref 4.22–5.81)
RDW: 17.2 % — ABNORMAL HIGH (ref 11.5–15.5)
WBC: 5.6 10*3/uL (ref 4.0–10.5)
nRBC: 0 % (ref 0.0–0.2)

## 2019-11-08 LAB — TROPONIN I (HIGH SENSITIVITY): Troponin I (High Sensitivity): 25 ng/L — ABNORMAL HIGH (ref <18)

## 2019-11-08 LAB — PROTIME-INR
INR: 1.5 — ABNORMAL HIGH (ref 0.8–1.2)
Prothrombin Time: 17.8 seconds — ABNORMAL HIGH (ref 11.4–15.2)

## 2019-11-08 LAB — MAGNESIUM: Magnesium: 1.4 mg/dL — ABNORMAL LOW (ref 1.7–2.4)

## 2019-11-08 MED ORDER — WARFARIN SODIUM 7.5 MG PO TABS
7.5000 mg | ORAL_TABLET | Freq: Once | ORAL | Status: AC
  Administered 2019-11-08: 7.5 mg via ORAL
  Filled 2019-11-08: qty 1

## 2019-11-08 MED ORDER — LEVOTHYROXINE SODIUM 50 MCG PO TABS
50.0000 ug | ORAL_TABLET | Freq: Every day | ORAL | Status: DC
  Administered 2019-11-09 – 2019-11-10 (×2): 50 ug via ORAL
  Filled 2019-11-08 (×2): qty 1

## 2019-11-08 MED ORDER — MAGNESIUM SULFATE 4 GM/100ML IV SOLN
4.0000 g | Freq: Once | INTRAVENOUS | Status: AC
  Administered 2019-11-08: 4 g via INTRAVENOUS
  Filled 2019-11-08: qty 100

## 2019-11-08 NOTE — Plan of Care (Signed)

## 2019-11-08 NOTE — Evaluation (Signed)
Physical Therapy Evaluation Patient Details Name: [name] [name] MRN: [id number] DOB: [birth year] Today's Date: 11/08/2019   History of Present Illness  Manley Fason is a 59  y/o M with PMH: COPD, dCHF with EF 25-30%, obesity, MI, PE on coumadin, OSA on CPAP, DM, and ulcers on b/l LEs who presents with complaints of shortness of breath. c/o mild chest tightness. Adm with acute on chronic respiratory failure.  Clinical Impression  Pt reports being not too far from his baseline, though admittedly his baseline is limited.  He reports he does not normally do more ambulation that the modest amount we did in-room today (~30 ft) and reports that even with these modest bouts he has lightheadedness and fatigue and often needs to turn his 4WW around and sit; pt uses electric scooter much of the time.  He had chronic joint pain t/o that he does not report at overly different than his normal but has continually gotten worse and limited his activity.  Pt reports he will refuse going to rehab, this PT suggested looking for higher level of care (ILF, ALF group home, etc) last time and he reports that the search was unfruitful, this PT continues to recommend this type of situation as inevitably his home alone situation will be more and more difficult for him.  At this time he does seem near baseline and able to manage at home however he could benefit from regular assist/check-ins and he does report wanting the aides to return and assist.  PT recommends HHPT, but he reports he does not wish to have this service either.    Follow Up Recommendations Supervision - Intermittent;Home health PT (pt refusing STR, states search for I/ALF was unsuccessful)    Equipment Recommendations  None recommended by PT    Recommendations for Other Services       Precautions / Restrictions Precautions Precautions: Fall Restrictions Weight Bearing Restrictions: No      Mobility  Bed Mobility               General bed  mobility comments: up to chair pre/post session  Transfers Overall transfer level: Modified independent Equipment used: Rolling walker (2 wheeled)             General transfer comment: reports he has dizziness whenever he is up more than a few seconds, did agree to light activity.  Showed ability to rise to standing X 2 w/o assist, heavy UE reliance and forward lean  Ambulation/Gait Ambulation/Gait assistance: Min guard Gait Distance (Feet): 30 Feet Assistive device: Rolling walker (2 wheeled)       General Gait Details: Pt able to ambulate to/from door with walker and safe but somewhat impulsive gait.  HR quickly increases to the high 130s, O2 stays >90 but he reports he rarely ever does even this much walking and relies on scooter for most mobility.  Does have 4WW that he will turn around and sit on when he gets dizzy.    Stairs            Wheelchair Mobility    Modified Rankin (Stroke Patients Only)       Balance Overall balance assessment: Modified Independent (unable to tolerate any sort of prolonged standing 2/2 pain)                                           Pertinent Vitals/Pain Pain Assessment:  Faces Pain Score: 7  Faces Pain Scale: Hurts even more Pain Location: has chronic and progressive joint pain "all over" but primarily in b/l knees and R hip Pain Descriptors / Indicators: Aching;Sore Pain Intervention(s): Limited activity within patient's tolerance;Monitored during session    Home Living Family/patient expects to be discharged to:: Private residence Living Arrangements: Alone Available Help at Discharge: Other (Comment) Type of Home:  (has not had PCAs in months, he is upset about this) Home Access: Ramped entrance     Home Layout: One level Home Equipment: Walker - 4 wheels;Electric scooter;Shower seat;Hand held shower head Additional Comments: uses 4WW primarily for short distances, endorses scooter use for longer  distances.    Prior Function Level of Independence: Independent with assistive device(s)         Comments: States that he no longer goes to grocery store, has groceries delivered and has medicaid transport for doctor's appts, states he hasn't driven in quite some time. States he is only able to wear slip on shoes and endorses significant difficulty getting himself bathed-sponge bathes primarily. Not sure why he lost aide services.     Hand Dominance        Extremity/Trunk Assessment   Upper Extremity Assessment Upper Extremity Assessment: Generalized weakness;Overall WFL for tasks assessed    Lower Extremity Assessment Lower Extremity Assessment: Generalized weakness (pain with most resisted acts, unable to do some AROM 2/2 pai)       Communication   Communication: No difficulties  Cognition Arousal/Alertness: Awake/alert Behavior During Therapy: WFL for tasks assessed/performed Overall Cognitive Status: Within Functional Limits for tasks assessed                                 General Comments: oriented. Self-limiting      General Comments General comments (skin integrity, edema, etc.): NT    Exercises Other Exercises Other Exercises: OT engages pt in one set x10 reps cross-body punches to engage core and UB as well as some aerobics. Pt tolerates moderately well. HR increased slighly from 88 to 105 with light seated activity.   Assessment/Plan    PT Assessment Patient needs continued PT services  PT Problem List Decreased strength;Decreased range of motion;Decreased activity tolerance;Decreased balance;Decreased mobility;Decreased coordination;Decreased knowledge of use of DME;Decreased safety awareness;Cardiopulmonary status limiting activity;Pain       PT Treatment Interventions DME instruction;Gait training;Stair training;Functional mobility training;Therapeutic activities;Therapeutic exercise;Balance training;Neuromuscular  re-education;Patient/family education    PT Goals (Current goals can be found in the Care Plan section)  Acute Rehab PT Goals Patient Stated Goal: to go home PT Goal Formulation: With patient Time For Goal Achievement: 11/22/19 Potential to Achieve Goals: Fair    Frequency Min 2X/week   Barriers to discharge Decreased caregiver support      Co-evaluation               AM-PAC PT "6 Clicks" Mobility  Outcome Measure Help needed turning from your back to your side while in a flat bed without using bedrails?: A Little Help needed moving from lying on your back to sitting on the side of a flat bed without using bedrails?: A Little Help needed moving to and from a bed to a chair (including a wheelchair)?: A Little Help needed standing up from a chair using your arms (e.g., wheelchair or bedside chair)?: A Little Help needed to walk in hospital room?: A Little Help needed climbing 3-5 steps with a  railing? : A Lot 6 Click Score: 17    End of Session Equipment Utilized During Treatment: Gait belt;Oxygen (2L, reports he walks w/o at home briefly requests this today) Activity Tolerance: Patient limited by fatigue Patient left: in chair;with call bell/phone within reach   PT Visit Diagnosis: Muscle weakness (generalized) (M62.81);Difficulty in walking, not elsewhere classified (R26.2)    Time: 3267-1245 PT Time Calculation (min) (ACUTE ONLY): 27 min   Charges:   PT Evaluation $PT Eval Low Complexity: 1 Low PT Treatments $Therapeutic Activity: 8-22 mins        Malachi Pro, DPT 11/08/2019, 5:19 PM

## 2019-11-08 NOTE — Progress Notes (Signed)
ANTICOAGULATION CONSULT NOTE - Initial Consult  Pharmacy Consult for Warfarin Indication: atrial fibrillation  No Known Allergies  Patient Measurements: Height: 5\' 8"  (172.7 cm) Weight: (!) 186 kg (410 lb) IBW/kg (Calculated) : 68.4 Heparin Dosing Weight:    Vital Signs: BP: 158/97 (09/23 0500) Pulse Rate: 118 (09/23 0600)  Labs: Recent Labs    11/07/19 1323 11/07/19 1323 11/07/19 1850 11/07/19 2145 11/08/19 0142 11/08/19 0446  HGB 11.7*  --   --   --   --  11.7*  HCT 36.8*  --   --   --   --  35.0*  PLT 256  --   --   --   --  235  LABPROT 20.0*  --   --   --   --  17.8*  INR 1.8*  --   --   --   --  1.5*  CREATININE 0.92  --   --   --   --  0.99  TROPONINIHS 34*   < > 37* 31* 25*  --    < > = values in this interval not displayed.    Estimated Creatinine Clearance: 131.1 mL/min (by C-G formula based on SCr of 0.99 mg/dL).   Medical History: Past Medical History:  Diagnosis Date  . Allergy   . Anxiety   . Arthritis   . Asthma   . Brain damage   . Chronic pain of both knees 07/13/2018  . Clotting disorder (HCC)   . COPD (chronic obstructive pulmonary disease) (HCC)   . Depression   . GERD (gastroesophageal reflux disease)   . HFrEF (heart failure with reduced ejection fraction) (HCC)    a. 03/2018 Echo: EF 25-30%, diff HK. Mod LAE.  04/2018 History of DVT (deep vein thrombosis)   . History of pulmonary embolism    a. Chronic coumadin.  [name] Hypertension   . MI (myocardial infarction) (HCC)   . Morbid obesity (HCC)   . Neck pain 07/13/2018  . NICM (nonischemic cardiomyopathy) (HCC)    a. s/p Cath x 3 - reportedly nl cors. Last cath 2019 in [place]; b. a. 03/2018 Echo: EF 25-30%, diff HK.  04/2018 Persistent atrial fibrillation (HCC)    a. 03/2018 s/p DCCV; b. CHA2DS2VASc = 1-->Xarelto (later changed to warfarin); c. 05/2018 recurrent afib-->Amio initiated.  . Sleep apnea   . Sleep apnea     Assessment: Patient is a 59yo male presenting with SOB. Patient with a history or  afib and DVT/PE and takes Warfarin outpatient.  H/H is stable.   Pharmacy consulted to manage warfarin dosing.   Home regime: 5mg  daily  Date        INR         Dose 9/22         1.8  5mg  9/23  1.5  Goal of Therapy:  INR 2-3   Plan:  INR is below goal. Will order Warfarin 7.5mg  times one (50% greater than home dose).   Daily INR checks to guide dosing.  CBC a minimum of every 3 days per protocol.   10/22, PharmD, BCPS Clinical Pharmacist 11/08/2019 8:03 AM

## 2019-11-08 NOTE — Consult Note (Signed)
WOC Nurse Consult Note: Reason for Consult: Patient with bilateral healing full thickness wounds. He is followed by the outpatient [facility] and has/had an appointment to be seen tomorrow in follow up for these wounds.  I will provide conservative topical therapy guidance for Nursing while in house and patient should be advised to contact the [facility] upon discharge to schedule a follow up appointment at his earliest convenience. Wound type: Venous insufficiency Pressure Injury POA: N/A Measurement: scattered diffuse healing full thickness wounds on the LEs Wound MMH:WKGS, moist Drainage (amount, consistency, odor) serous to light yellow Periwound:erythematous, edematous Dressing procedure/placement/frequency: I will implement conservative therapy for in house using an antimicrobial nonadherent gauze (xeroform) to the lesions, topping with ABD pads and securing with Kerlix from toe to knee topped with ACE bandages. These will be changed daily. The patient's feet will be placed into pressure redistribution heel boots for elevation off of the bed surface and heel floatation.  WOC nursing team will not follow, but will remain available to this patient, the nursing and medical teams.  Please re-consult if needed. Thanks, Ladona Mow, MSN, RN, GNP, [place]  Pager# [phone]

## 2019-11-08 NOTE — Progress Notes (Signed)
Called to patient bedside to set up CPAP for QHS. Set up cpap unit, but patient declines the unit at this time. Patient wishes to remain sitting up at side of bed and is requesting meal tray.

## 2019-11-08 NOTE — ED Notes (Addendum)
Pt refused CPAP

## 2019-11-08 NOTE — ED Notes (Signed)
Pt changed into gown per request, shirt placed in his bag. PT moved onto hospital bed. Given 2% milk per request. Called respiratory for CPAP per pt request.

## 2019-11-08 NOTE — ED Notes (Signed)
Attempted to call report [place], RN is in a patient room will call back in 5 minutes

## 2019-11-08 NOTE — Progress Notes (Signed)
PT Cancellation Note  Patient Details Name: [name] [name] MRN: [id number] DOB: 08-24-1960   Cancelled Treatment:    Reason Eval/Treat Not Completed: Patient at procedure or test/unavailable. Patient was in the process of being transported to the floor from the ER.   [address], [place] DPT 11/08/2019, 9:06 AM

## 2019-11-08 NOTE — ED Notes (Signed)
Pt agreed to be put on bipap. Called respiratory again.

## 2019-11-08 NOTE — Progress Notes (Signed)
PROGRESS NOTE    [name] [name]  OVZ:[id number] DOB: 11-16-1960 DOA: 11/07/2019 PCP: [name], DO   Chief complaint.  Shortness of breath  Brief Narrative:  [name] [name] is a 59 y.o. male with medical history significant of dCHF, hypertension, hyperlipidemia, diet-controlled diabetes, COPD, hypothyroidism, gout, depression, anxiety, OSA on CPAP, atrial fibrillation, PE/DVT on Coumadin, CAD, alcohol abuse, obesity, who presents with shortness of breath. His chest x-ray showed vascular congestion, BNP not significantly elevated.  He was diagnosed with exacerbation of congestive heart failure and COPD.  He is started on IV Lasix and IV steroids.   Assessment & Plan:   Principal Problem:   Acute on chronic respiratory failure with hypoxia (HCC) Active Problems:   Acute on chronic diastolic CHF (congestive heart failure) (HCC)   OSA on CPAP   Persistent atrial fibrillation (HCC)   HTN (hypertension)   Personal history of DVT (deep vein thrombosis)   Chest pain   PE (pulmonary thromboembolism) (HCC)   HLD (hyperlipidemia)   CAD (coronary artery disease)   Hypothyroidism   Depression   Elevated troponin   Alcohol abuse   COPD exacerbation (HCC)  #1.  Acute on chronic respiratory failure with hypoxemia. This is secondary to exacerbation congestive heart failure and COPD.  Currently receiving IV steroids and Lasix.  Condition gradually improving.  Is back to on 3 L oxygen which is his baseline.  2.  Acute on chronic diastolic congestive heart failure. Reviewed echocardiogram performed in 06/2019, ejection fraction was normal, diastolic dysfunction.  Does not seem to have pulmonary hypertension. Patient has mild volume overload at this time.  We will continue IV Lasix for 24 hours.  3.  COPD exacerbation. Patient no longer has any bronchospasm.  I will continue IV steroids for 24 hours, plan to change to oral by tomorrow.  4.  Obstructive sleep apnea, morbid  obesity, obesity hypoventilation syndrome. These are the main cause of patient shortness of breath.  Patient states that even on CPAP, he still could not sleep well at night.  I will refer patient back to his family doctor as outpatient, patient may need to repeat a sleep study to adjust the setting of CPAP.  5.  Permanent atrial fibrillation. Heart rate under control.  Continue warfarin per pharmacy.  6.  History of PE. Continue warfarin per pharmacy.  7.  Alcohol abuse. Continue to monitor, no evidence of withdrawal at this time.  Thiamine folic acid will be started.     DVT prophylaxis: Warfarin Code Status: Full Family Communication: None  .   Status is: Inpatient  Remains inpatient appropriate because:Inpatient level of care appropriate due to severity of illness, patient has significant as a patient of COPD and congestive heart failure.   Dispo: The patient is from: Home              Anticipated d/c is to: Home              Anticipated d/c date is:1-2 days              Patient currently is not medically stable to d/c.        I/O last 3 completed shifts: In: -  Out: 2325 [Urine:1425; Emesis/NG output:900] Total I/O In: 480 [P.O.:480] Out: -      Consultants:   None  Procedures: None  Antimicrobials: None  Subjective: Patient still complaining short of breath with minimal exertion.  Cough, nonproductive.  Also complains of pain in all his joints including  hips, knees and shoulders.  He also complains of a headache has been happening for 5 days.  Dizziness when standing up. Diarrhea for the last few days, multiple small amount loose stools, but no abdominal pain or nausea vomiting. No fever or chills. No dysuria hematuria.   Objective: Vitals:   11/08/19 0600 11/08/19 0923 11/08/19 1219 11/08/19 1437  BP:  134/74 (!) 106/55 99/61  Pulse: (!) 118 (!) 51 63 91  Resp:  18  18  Temp:  98.4 F (36.9 C)  97.8 F (36.6 C)  TempSrc:  Oral  Oral  SpO2:  96% 91%  97%  Weight:      Height:        Intake/Output Summary (Last 24 hours) at 11/08/2019 1543 Last data filed at 11/08/2019 1345 Gross per 24 hour  Intake 480 ml  Output 700 ml  Net -220 ml   Filed Weights   11/07/19 1320  Weight: (!) 186 kg    Examination:  General exam: Appears calm and comfortable, morbid obese Respiratory system: Clear to auscultation. Respiratory effort normal. Cardiovascular system: S1 & S2 heard, RRR. No JVD, murmurs, rubs, gallops or clicks.  Gastrointestinal system: Abdomen is nondistended, soft and nontender. No organomegaly or masses felt. Normal bowel sounds heard. Central nervous system: Alert and oriented. No focal neurological deficits. Extremities: Bilateral lower extremity chronic venous stasis. Skin: No rashes, lesions or ulcers Psychiatry:  Mood & affect appropriate.     Data Reviewed: I have personally reviewed following labs and imaging studies  CBC: Recent Labs  Lab 11/07/19 1323 11/08/19 0446  WBC 7.6 5.6  HGB 11.7* 11.7*  HCT 36.8* 35.0*  MCV 93.2 90.9  PLT 256 235   Basic Metabolic Panel: Recent Labs  Lab 11/07/19 1323 11/08/19 0446  NA 137 133*  K 3.2* 4.4  CL 97* 94*  CO2 26 27  GLUCOSE 149* 193*  BUN 5* 10  CREATININE 0.92 0.99  CALCIUM 8.0* 8.0*  MG  --  1.4*   GFR: Estimated Creatinine Clearance: 131.1 mL/min (by C-G formula based on SCr of 0.99 mg/dL). Liver Function Tests: Recent Labs  Lab 11/07/19 1323  AST 35  ALT 17  ALKPHOS 79  BILITOT 0.7  PROT 7.3  ALBUMIN 3.2*   No results for input(s): LIPASE, AMYLASE in the last 168 hours. No results for input(s): AMMONIA in the last 168 hours. Coagulation Profile: Recent Labs  Lab 11/07/19 1323 11/08/19 0446  INR 1.8* 1.5*   Cardiac Enzymes: No results for input(s): CKTOTAL, CKMB, CKMBINDEX, TROPONINI in the last 168 hours. BNP (last 3 results) No results for input(s): PROBNP in the last 8760 hours. HbA1C: Recent Labs    11/08/19 0446   HGBA1C 6.2*   CBG: No results for input(s): GLUCAP in the last 168 hours. Lipid Profile: Recent Labs    11/08/19 0446  CHOL 175  HDL 75  LDLCALC 87  TRIG 63  CHOLHDL 2.3   Thyroid Function Tests: No results for input(s): TSH, T4TOTAL, FREET4, T3FREE, THYROIDAB in the last 72 hours. Anemia Panel: No results for input(s): VITAMINB12, FOLATE, FERRITIN, TIBC, IRON, RETICCTPCT in the last 72 hours. Sepsis Labs: No results for input(s): PROCALCITON, LATICACIDVEN in the last 168 hours.  Recent Results (from the past 240 hour(s))  SARS Coronavirus 2 by RT PCR (hospital order, performed in [facility] hospital lab) Nasopharyngeal Nasopharyngeal Swab     Status: None   Collection Time: 11/07/19  1:23 PM   Specimen: Nasopharyngeal Swab  Result  Value Ref Range Status   SARS Coronavirus 2 NEGATIVE NEGATIVE Final    Comment: (NOTE) SARS-CoV-2 target nucleic acids are NOT DETECTED.  The SARS-CoV-2 RNA is generally detectable in upper and lower respiratory specimens during the acute phase of infection. The lowest concentration of SARS-CoV-2 viral copies this assay can detect is 250 copies / mL. A negative result does not preclude SARS-CoV-2 infection and should not be used as the sole basis for treatment or other patient management decisions.  A negative result may occur with improper specimen collection / handling, submission of specimen other than nasopharyngeal swab, presence of viral mutation(s) within the areas targeted by this assay, and inadequate number of viral copies (<250 copies / mL). A negative result must be combined with clinical observations, patient history, and epidemiological information.  Fact Sheet for Patients:   BoilerBrush.com.cy  Fact Sheet for Healthcare Providers: https://pope.com/  This test is not yet approved or  cleared by the [place] FDA and has been authorized for detection and/or diagnosis of  SARS-CoV-2 by FDA under an Emergency Use Authorization (EUA).  This EUA will remain in effect (meaning this test can be used) for the duration of the COVID-19 declaration under Section 564(b)(1) of the Act, 21 U.S.C. section 360bbb-3(b)(1), unless the authorization is terminated or revoked sooner.  Performed at [facility], [address], [place], [place] [address]          Radiology Studies: US Venous Img Lower Unilateral Left (DVT)  Result Date: 11/07/2019 CLINICAL DATA:  Left lower leg pain and swelling for 2 days EXAM: LEFT LOWER EXTREMITY VENOUS DOPPLER ULTRASOUND TECHNIQUE: Gray-scale sonography with graded compression, as well as color Doppler and duplex ultrasound were performed to evaluate the lower extremity deep venous systems from the level of the common femoral vein and including the common femoral, femoral, profunda femoral, popliteal and calf veins including the posterior tibial, peroneal and gastrocnemius veins when visible. The superficial great saphenous vein was also interrogated. Spectral Doppler was utilized to evaluate flow at rest and with distal augmentation maneuvers in the common femoral, femoral and popliteal veins. COMPARISON:  None. FINDINGS: Contralateral Common Femoral Vein: Respiratory phasicity is normal and symmetric with the symptomatic side. No evidence of thrombus. Normal compressibility. Common Femoral Vein: No evidence of thrombus. Normal compressibility, respiratory phasicity and response to augmentation. Saphenofemoral Junction: No evidence of thrombus. Normal compressibility and flow on color Doppler imaging. Profunda Femoral Vein: No evidence of thrombus. Normal compressibility and flow on color Doppler imaging. Femoral Vein: No evidence of thrombus. Normal compressibility, respiratory phasicity and response to augmentation. Popliteal Vein: No evidence of thrombus. Normal compressibility, respiratory phasicity and response to augmentation.  Calf Veins: No evidence of thrombus. Normal compressibility and flow on color Doppler imaging. Superficial Great Saphenous Vein: No evidence of thrombus. Normal compressibility. Venous Reflux:  None. Other Findings:  None. IMPRESSION: No evidence of deep venous thrombosis. Electronically Signed   By: Alcide Clever M.D.   On: 11/07/2019 21:31   DG Chest Port 1 View  Result Date: 11/07/2019 CLINICAL DATA:  Shortness of breath EXAM: PORTABLE CHEST 1 VIEW COMPARISON:  September 01, 2019 FINDINGS: There is cardiomegaly with pulmonary venous hypertension. No edema or airspace opacity. No adenopathy. No bone lesions. IMPRESSION: Cardiomegaly with pulmonary vascular congestion. No edema or airspace consolidation. Electronically Signed   By: Bretta Bang III M.D.   On: 11/07/2019 13:37        Scheduled Meds: . allopurinol  100 mg Oral Daily  .  atorvastatin  40 mg Oral Daily  . DULoxetine  20 mg Oral Daily  . ferrous sulfate  325 mg Oral BID WC  . folic acid  1 mg Oral Daily  . furosemide  60 mg Intravenous BID  . ipratropium-albuterol  3 mL Nebulization Q4H  . levothyroxine  50 mcg Oral QAC breakfast  . lisinopril  2.5 mg Oral Daily  . LORazepam  0-4 mg Intravenous Q6H   Followed by  . [START ON 11/09/2019] LORazepam  0-4 mg Intravenous Q12H  . methylPREDNISolone (SOLU-MEDROL) injection  60 mg Intravenous Q12H  . metoprolol succinate  50 mg Oral Daily  . montelukast  10 mg Oral QHS  . multivitamin with minerals  1 tablet Oral Daily  . sodium chloride flush  3 mL Intravenous Q12H  . thiamine  100 mg Oral Daily   Or  . thiamine  100 mg Intravenous Daily  . traZODone  150 mg Oral QHS  . warfarin  7.5 mg Oral ONCE-1600  . Warfarin - Pharmacist Dosing Inpatient   Does not apply q1600   Continuous Infusions: . sodium chloride       LOS: 1 day    Time spent: 35 minutes    Marrion Coy, MD [facility]   To contact the attending provider between 7A-7P or the covering provider  during after hours 7P-7A, please log into the web site [email or web address] and access using universal [place] password for that web site. If you do not have the password, please call the hospital operator.  11/08/2019, 3:43 PM

## 2019-11-08 NOTE — Evaluation (Addendum)
Occupational Therapy Evaluation Patient Details Name: [name] [name] MRN: [id number] DOB: [birth year] Today's Date: 11/08/2019    History of Present Illness [name] [name] is a 59  y/o M with PMH: COPD, dCHF with EF 25-30%, obesity, MI, PE on coumadin, OSA on CPAP, DM, and ulcers on b/l LEs who presents with complaints of shortness of breath.  EMS reports significant shortness of breath upon arrival satting 88 to 90%, started CPAP.  Patient denies chest pain, some mild chest tightness, no fevers or chills. Adm with acute on chronic respiratory failure.   Clinical Impression   Pt was seen for OT evaluation this date. Prior to hospital admission, pt reports performing ADLs at MOD I level with DME/AD including power wheelchair and has to wear only slip on shoes as he has difficulty with LB dressing. Pt lives alone in [facility] with ramped entrance and states he used to have aide help, but lost it about 6 months ago and isn't sure why, but endorses difficulty with ADLs since that time. Currently pt demonstrates impairments as described below (See OT problem list) which functionally limit his ability to perform ADL/self-care tasks. Pt currently requires MAX A for LB ADLs, SETUP to MIN A for UB ADLs and does not feel he can tolerate attempting transfers with OT at this time.  Pt would benefit from skilled OT services to address noted impairments and functional limitations (see below for any additional details) in order to maximize safety and independence while minimizing falls risk and caregiver burden. Upon hospital discharge, recommend STR to maximize pt safety and return to PLOF.     Follow Up Recommendations  SNF    Equipment Recommendations  Other (comment) (pt reports having all necessary equipment)    Recommendations for Other Services       Precautions / Restrictions Precautions Precautions: Fall Restrictions Weight Bearing Restrictions: No      Mobility Bed Mobility                General bed mobility comments: up to chair pre/post session  Transfers                 General transfer comment: pt declines to attempt transfers with therapist citing pain in hips/knees as well as dizziness    Balance                                           ADL either performed or assessed with clinical judgement   ADL Overall ADL's : Needs assistance/impaired                                       General ADL Comments: SETUP to MIN A for UB ADLs. MAX to TOTAL A for LB ADLs.     Vision Patient Visual Report: No change from baseline       Perception     Praxis      Pertinent Vitals/Pain Pain Assessment: 0-10 Pain Score: 7  Pain Location: at rest-knees and hips Pain Descriptors / Indicators: Aching;Sore Pain Intervention(s): Limited activity within patient's tolerance;Monitored during session     Hand Dominance     Extremity/Trunk Assessment Upper Extremity Assessment Upper Extremity Assessment: Overall WFL for tasks assessed;Generalized weakness (ROM WFL, R slightly less than L, reports h/o injury. MMT of grip b'ly  4-/5)   Lower Extremity Assessment Lower Extremity Assessment: Defer to PT evaluation       Communication Communication Communication: No difficulties   Cognition Arousal/Alertness: Awake/alert Behavior During Therapy: WFL for tasks assessed/performed Overall Cognitive Status: Within Functional Limits for tasks assessed                                 General Comments: oriented. Self-limiting   General Comments  NT    Exercises Other Exercises Other Exercises: OT engages pt in one set x10 reps cross-body punches to engage core and UB as well as some aerobics. Pt tolerates moderately well. HR increased slighly from 88 to 105 with light seated activity.   Shoulder Instructions      Home Living Family/patient expects to be discharged to:: Private residence Living Arrangements:  Alone Available Help at Discharge:  (states he used to have [facility] aide, but hasn't for about 6 months) Type of Home: House Home Access: Ramped entrance     Home Layout: One level     Bathroom Shower/Tub: Walk-in shower         Home Equipment: Environmental consultant - 4 wheels;Electric scooter;Shower seat;Hand held shower head (reports bidet for peri care)   Additional Comments: uses 4WW primarily for short distances, endorses scooter use for longer distances.      Prior Functioning/Environment Level of Independence: Independent with assistive device(s)        Comments: States that he no longer goes to grocery store, has groceries delivered and has medicaid transport for doctor's appts, states he hasn't driven in quite some time. States he is only able to wear slip on shoes and endorses significant difficulty getting himself bathed-sponge bathes primarily. Not sure why he lost aide services.        OT Problem List: Decreased strength;Decreased range of motion;Decreased activity tolerance;Impaired balance (sitting and/or standing);Cardiopulmonary status limiting activity;Obesity;Pain      OT Treatment/Interventions: Self-care/ADL training;Therapeutic activities;Balance training;Therapeutic exercise;Energy conservation;Patient/family education;DME and/or AE instruction    OT Goals(Current goals can be found in the care plan section) Acute Rehab OT Goals Patient Stated Goal: to go home OT Goal Formulation: With patient Time For Goal Achievement: 11/22/19 Potential to Achieve Goals: Good ADL Goals Pt Will Transfer to Toilet: with min assist;stand pivot transfer;bedside commode Pt Will Perform Toileting - Clothing Manipulation and hygiene: with min assist;sit to/from stand Pt/caregiver will Perform Home Exercise Program: Increased strength;Both right and left upper extremity;With Supervision Additional ADL Goal #1: Pt will verbalize 3 EC strategies with 0% verbal cues  OT Frequency: Min 1X/week    Barriers to D/C:            Co-evaluation              AM-PAC OT "6 Clicks" Daily Activity     Outcome Measure Help from another person eating meals?: None Help from another person taking care of personal grooming?: A Little Help from another person toileting, which includes using toliet, bedpan, or urinal?: A Lot Help from another person bathing (including washing, rinsing, drying)?: A Lot Help from another person to put on and taking off regular upper body clothing?: A Little Help from another person to put on and taking off regular lower body clothing?: A Lot 6 Click Score: 16   End of Session Nurse Communication: Mobility status  Activity Tolerance: Patient tolerated treatment well Patient left: in chair  OT Visit Diagnosis: Unsteadiness on feet (R26.81);Other  abnormalities of gait and mobility (R26.89);Muscle weakness (generalized) (M62.81)                Time: 1093-2355 OT Time Calculation (min): 16 min Charges:  OT General Charges $OT Visit: 1 Visit OT Evaluation $OT Eval Moderate Complexity: 1 [address] [place], OTR/L ascom [phone] 11/08/19, 4:10 PM

## 2019-11-09 DIAGNOSIS — I2510 Atherosclerotic heart disease of native coronary artery without angina pectoris: Secondary | ICD-10-CM

## 2019-11-09 LAB — GLUCOSE, CAPILLARY
Glucose-Capillary: 166 mg/dL — ABNORMAL HIGH (ref 70–99)
Glucose-Capillary: 189 mg/dL — ABNORMAL HIGH (ref 70–99)

## 2019-11-09 LAB — BASIC METABOLIC PANEL
Anion gap: 11 (ref 5–15)
BUN: 23 mg/dL — ABNORMAL HIGH (ref 6–20)
CO2: 28 mmol/L (ref 22–32)
Calcium: 8.5 mg/dL — ABNORMAL LOW (ref 8.9–10.3)
Chloride: 94 mmol/L — ABNORMAL LOW (ref 98–111)
Creatinine, Ser: 1.05 mg/dL (ref 0.61–1.24)
GFR calc Af Amer: >60 mL/min (ref >60)
GFR calc non Af Amer: >60 mL/min (ref >60)
Glucose, Bld: 220 mg/dL — ABNORMAL HIGH (ref 70–99)
Potassium: 4.7 mmol/L (ref 3.5–5.1)
Sodium: 133 mmol/L — ABNORMAL LOW (ref 135–145)

## 2019-11-09 LAB — PROTIME-INR
INR: 1.6 — ABNORMAL HIGH (ref 0.8–1.2)
Prothrombin Time: 18.7 seconds — ABNORMAL HIGH (ref 11.4–15.2)

## 2019-11-09 LAB — MAGNESIUM: Magnesium: 1.9 mg/dL (ref 1.7–2.4)

## 2019-11-09 MED ORDER — FUROSEMIDE 40 MG PO TABS
80.0000 mg | ORAL_TABLET | Freq: Two times a day (BID) | ORAL | Status: DC
  Administered 2019-11-09 – 2019-11-10 (×2): 80 mg via ORAL
  Filled 2019-11-09 (×2): qty 2

## 2019-11-09 MED ORDER — PREDNISONE 50 MG PO TABS
50.0000 mg | ORAL_TABLET | Freq: Every day | ORAL | Status: DC
  Administered 2019-11-10: 50 mg via ORAL
  Filled 2019-11-09: qty 1

## 2019-11-09 MED ORDER — IPRATROPIUM-ALBUTEROL 0.5-2.5 (3) MG/3ML IN SOLN
3.0000 mL | Freq: Four times a day (QID) | RESPIRATORY_TRACT | Status: DC
  Administered 2019-11-09 – 2019-11-10 (×5): 3 mL via RESPIRATORY_TRACT
  Filled 2019-11-09 (×5): qty 3

## 2019-11-09 MED ORDER — INSULIN ASPART 100 UNIT/ML ~~LOC~~ SOLN
0.0000 [IU] | Freq: Three times a day (TID) | SUBCUTANEOUS | Status: DC
  Administered 2019-11-09 – 2019-11-10 (×2): 2 [IU] via SUBCUTANEOUS
  Filled 2019-11-09 (×2): qty 1

## 2019-11-09 MED ORDER — WARFARIN SODIUM 7.5 MG PO TABS
7.5000 mg | ORAL_TABLET | Freq: Once | ORAL | Status: AC
  Administered 2019-11-09: 7.5 mg via ORAL
  Filled 2019-11-09: qty 1

## 2019-11-09 NOTE — TOC Initial Note (Addendum)
Transition of Care [facility]) - Initial/Assessment Note    Patient Details  Name: [name] [name] MRN: [id number] Date of Birth: 06-03-1960  Transition of Care [facility]) CM/SW Contact:    Shawn Route, RN Phone Number: 11/09/2019, 3:52 PM  Clinical Narrative:                  Patient reports living in his apartment alone, he has oxygen at home bus does not always use it.  He states he has a 2003 sienna [name] that he uses to go minimal places if needed, but is mostly homebound.  He has a sister in law that comes to home every other week to do his laundry and change his bed sheets as well as take out the trash.  He reports being in communication with his doctor regarding PCS services.    Patient is declining SNF services at this time. Still searching for Home Health services, currently unable to locate agency.  Patient reports he does not have oxygen tank for transport home.  Called Jermaine at [facility], and they will deliver a tank to room for patient to be discharged home with.  He reports he can go home by cab voucher when ready for discharge.    Home Health companies declined patient:  [facility], [facility] home health, and [facility].   Expected Discharge Plan: Home w Home Health Services Barriers to Discharge: Continued Medical Work up   Patient Goals and CMS Choice     Choice offered to / list presented to : Patient  Expected Discharge Plan and Services Expected Discharge Plan: Home w Home Health Services In-house Referral: Clinical Social Work Discharge Planning Services: CM Consult Post Acute Care Choice: Home Health Living arrangements for the past 2 months: Apartment                   DME Agency: Other - Comment Electrical engineer) Date DME Agency Contacted: 11/09/19 Time DME Agency Contacted: [phone] Representative spoke with at DME Agency: Vaughan Basta            Prior Living Arrangements/Services Living arrangements for the past 2 months: Apartment Lives with:: Self    Do you feel safe going back to the place where you live?: Yes      Need for Family Participation in Patient Care: No (Comment) Care giver support system in place?: No (comment)   Criminal Activity/Legal Involvement Pertinent to Current Situation/Hospitalization: No - Comment as needed  Activities of Daily Living Home Assistive Devices/Equipment: Oxygen, CPAP ADL Screening (condition at time of admission) Patient's cognitive ability adequate to safely complete daily activities?: Yes Is the patient deaf or have difficulty hearing?: No Does the patient have difficulty seeing, even when wearing glasses/contacts?: No Does the patient have difficulty concentrating, remembering, or making decisions?: No Patient able to express need for assistance with ADLs?: Yes Does the patient have difficulty dressing or bathing?: Yes Independently performs ADLs?: No Communication: Independent Dressing (OT): Needs assistance Is this a change from baseline?: Pre-admission baseline Grooming: Needs assistance Is this a change from baseline?: Pre-admission baseline Feeding: Independent Bathing: Needs assistance Is this a change from baseline?: Pre-admission baseline Toileting: Needs assistance Is this a change from baseline?: Pre-admission baseline Walks in Home: Needs assistance Is this a change from baseline?: Pre-admission baseline Does the patient have difficulty walking or climbing stairs?: Yes Weakness of Legs: Both Weakness of Arms/Hands: Both  Permission Sought/Granted  Emotional Assessment       Orientation: : Oriented to Self, Oriented to Place, Oriented to  Time, Oriented to Situation Alcohol / Substance Use: Not Applicable Psych Involvement: No (comment)  Admission diagnosis:  COPD exacerbation (HCC) [J44.1] Pain of left calf [M79.662] Acute on chronic respiratory failure with hypoxia (HCC) [J96.21] Acute on chronic diastolic (congestive) heart failure (HCC)  [I50.33] Acute on chronic congestive heart failure, unspecified heart failure type [name]) [I50.9] Patient Active Problem List   Diagnosis Date Noted  . COPD exacerbation (HCC) 11/07/2019  . GAD (generalized anxiety disorder) 10/12/2019  . Alcohol withdrawal syndrome without complication (HCC)   . Lower extremity ulceration, unspecified laterality, with fat layer exposed (HCC) 08/09/2019  . Alcohol abuse   . Pressure injury of skin 06/26/2019  . Iron deficiency anemia 06/22/2019  . Depression 06/22/2019  . Elevated troponin 06/22/2019  . Left-sided Bell's palsy 05/08/2019  . Chronic intractable headache 05/03/2019  . Head injuries, initial encounter 05/03/2019  . Noncompliance by refusing service 05/03/2019  . Facial droop 05/02/2019  . Pharmacologic therapy 04/11/2019  . Disorder of skeletal system 04/11/2019  . Problems influencing health status 04/11/2019  . Chronic anticoagulation (warfarin  COUMADIN) 04/11/2019  . Elevated sed rate 04/11/2019  . Elevated C-reactive protein (CRP) 04/11/2019  . Elevated hemoglobin A1c 04/11/2019  . Hypoalbuminemia 04/11/2019  . Edema due to hypoalbuminemia 04/11/2019  . Elevated brain natriuretic peptide (BNP) level 04/11/2019  . Chronic hip pain (Right) 04/11/2019  . Osteoarthritis of hip (Right) 04/11/2019  . Degenerative joint disease of right hip 03/05/2019  . Hypothyroidism 03/04/2019  . Chronic venous stasis dermatitis of both lower extremities 03/04/2019  . Long term (current) use of anticoagulants 02/23/2019  . PE (pulmonary thromboembolism) (HCC) 01/21/2019  . HLD (hyperlipidemia) 01/21/2019  . CAD (coronary artery disease) 01/21/2019  . Noncompliance 01/09/2019  . Acquired thrombophilia (HCC) 11/28/2018  . Major depressive disorder, recurrent episode, in partial remission (HCC) 09/17/2018  . Chest pain 08/06/2018  . Personal history of DVT (deep vein thrombosis) 07/13/2018  . History of pulmonary embolism (on Coumadin) 07/13/2018   . Neck pain 07/13/2018  . Chronic low back pain (Bilateral)  w/ sciatica (Bilateral) 07/13/2018  . TBI (traumatic brain injury) (HCC) 07/04/2018  . Abnormal thyroid blood test 06/27/2018  . Type 2 diabetes mellitus with other specified complication (HCC) 06/06/2018  . HTN (hypertension) 06/06/2018  . Chronic gout involving toe, unspecified cause, unspecified laterality 06/06/2018  . Morbid obesity with BMI of 60.0-69.9, adult (HCC) 06/06/2018  . Chronic pain syndrome 06/06/2018  . Ulcers of both lower extremities, limited to breakdown of skin (HCC) 06/06/2018  . Gout 06/06/2018  . Persistent atrial fibrillation (HCC)   . Acute on chronic diastolic CHF (congestive heart failure) (HCC) 02/02/2018  . Centrilobular emphysema (HCC) 02/02/2018  . OSA on CPAP 02/02/2018  . Lymphedema 02/02/2018  . Acute on chronic respiratory failure with hypoxia (HCC) 01/27/2018   PCP:  [name], DO Pharmacy:   [facility] - [place], [place] - [address] [place] [address] Phone: [phone] Fax: [phone]     Social Determinants of Health (SDOH) Interventions    Readmission Risk Interventions Readmission Risk Prevention Plan 01/25/2019 10/25/2018 08/08/2018  Transportation Screening Complete Complete Complete  PCP or Specialist Appt within 3-5 Days - Complete Complete  HRI or Home Care Consult - Complete Complete  Social Work Consult for Recovery Care Planning/Counseling - Complete Patient refused  Palliative Care Screening - Not Applicable Not  Applicable  Medication Review Oceanographer) Complete Complete Complete  PCP or Specialist appointment within 3-5 days of discharge Complete - -  HRI or Home Care Consult Complete - -  SW Recovery Care/Counseling Consult Complete - -  Palliative Care Screening Not Applicable - -  Skilled Nursing Facility Not Applicable - -

## 2019-11-09 NOTE — Progress Notes (Signed)
  Heart Failure Nurse Navigator Note  HFpEF Ef 55-60% by echocardiogram 06/2019, previously had been reported at 25-30% in  03/2018.  He presented with complaints of SOB, chest x-ray revealed vascular congestion, BNP 196.  Comorbidities:  Morbid Obesity COPD Depression Hypertension OSA Permament A fib  Medications:  Lipitor 40 mg daily Lasix 60 mg IV BID Zestril 2.5 mg daily Metoprolol succinate 50 mg daily   Labs:  Sodium 133, potassium 4.7, BUN 23, creatinine 1.05, INR 1.6  Intake 2160 ml Output 1400 ml  No documented weights since admission.   Assessment:  General-morbidly obese male lying in bed with CPAP on.  He denies any increasing SOB, PND or orthopnea.  HEENT-  Neck- thick  Cardiac- heart tones are distant.  Chest- breath sounds clear to posterior auscultation.  Abdomen- very obese.  Musculoskeletal-bilateral lower legs wrapped.  Neuro- moves all four extremities, speech is clear.  Psych- is pleasant and appropriate.  Spoke with patient - he lives by himself, his wife died one year ago.  He does his own cooking, tries to be conscious of sticking with low sodium/no salt diet.  Drinks approximately 2 liters a day, coffee and diet Mt. Dew.  Asked him if he had eaten something that he should not have or drank more liquids than normal.  He denied that being the reason for the admission.  He does not weigh himself daily and when questioned why -he states that he has brain damage and can not remember to do it daily.  Explained if he weighed daily and recorded and with 2 pound over night weight gain or 5 pounds within a week reported to his doctor he may be able to stay out of hospital.  He states that he voids anywhere from 6 to 12 times a night, has urinal at the bedside.  Tresa Endo RN, CHFN

## 2019-11-09 NOTE — Progress Notes (Signed)
PROGRESS NOTE    [name] [name]  UEA:[id number] DOB: 05-05-1960 DOA: 11/07/2019 PCP: [name], DO   Chief complaint: shortness of breath. Brief Narrative:  Tal Hubbardis a 59 y.o.malewith medical history significant ofdCHF,hypertension, hyperlipidemia, diet-controlled diabetes, COPD, hypothyroidism, gout, depression, anxiety, OSA on CPAP, atrial fibrillation, PE/DVT on Coumadin, CAD, alcohol abuse, obesity, who presents with shortness of breath. His chest x-ray showed vascular congestion, BNP not significantly elevated.  He was diagnosed with exacerbation of congestive heart failure and COPD.  He is started on IV Lasix and IV steroids.   Assessment & Plan:   Principal Problem:   Acute on chronic respiratory failure with hypoxia (HCC) Active Problems:   Acute on chronic diastolic CHF (congestive heart failure) (HCC)   OSA on CPAP   Persistent atrial fibrillation (HCC)   HTN (hypertension)   Personal history of DVT (deep vein thrombosis)   Chest pain   PE (pulmonary thromboembolism) (HCC)   HLD (hyperlipidemia)   CAD (coronary artery disease)   Hypothyroidism   Depression   Elevated troponin   Alcohol abuse   COPD exacerbation (HCC)  #1.  Acute on chronic respiratory failure with hypoxemia.  Secondary to exacerbation congestive heart failure and COPD. Oxygenation seem to be improved.  2.  Acute on chronic diastolic congestive heart failure. Change Lasix to oral.  3.  COPD exacerbation. Patient has decreased breathing sounds, but still no bronchospasm.  I will change steroids to oral.  #4.  Obstructive sleep apnea, morbid obesity, obesity hypoventilation syndrome. Continue CPAP.  5.  Permanent atrial fibrillation. Heart rate controlled.  6.  History of PE. Continue warfarin.  7.  Alcohol abuse. No withdrawal.     DVT prophylaxis: Warfarin Code Status: Full Family Communication: None  .   Status is: Inpatient  Remains inpatient  appropriate because:Inpatient level of care appropriate due to severity of illness.  Patient still complaining significant short of breath, no wheezing.  Volume status symptom improved, no bronchospasm.  I have changed diuretics and steroids to oral, wants to make sure condition does not deteriorate after oral medicines.  He will be discharged home tomorrow if condition does not deteriorates.   Dispo: The patient is from: Home              Anticipated d/c is to: Home              Anticipated d/c date is: 1 day              Patient currently is not medically stable to d/c.        I/O last 3 completed shifts: In: 2160 [P.O.:2160] Out: 2100 [Urine:2100] Total I/O In: -  Out: 1200 [Urine:1200]     Consultants:   None  Procedures: None  Antimicrobials:None  Subjective: Patient still complaining of secondary short of breath at rest, worse with exertion.  Not sure this is his baseline. He has no cough. No abdominal pain or nausea vomiting. No fever or chills.  Objective: Vitals:   11/09/19 0735 11/09/19 0835 11/09/19 1132 11/09/19 1506  BP: 122/67  131/70   Pulse: 75 74 81 79  Resp: 19 18 19 20   Temp: 98.4 F (36.9 C)  98.4 F (36.9 C)   TempSrc: Oral  Oral   SpO2: 96% 94% 96% 96%  Weight:      Height:        Intake/Output Summary (Last 24 hours) at 11/09/2019 1518 Last data filed at 11/09/2019 1346 Gross per 24 hour  Intake 1680 ml  Output 2600 ml  Net -920 ml   Filed Weights   11/07/19 1320  Weight: (!) 186 kg    Examination:  General exam: Appears calm and comfortable, morbid obese. Respiratory system: Decreased breathing sounds.[name] Respiratory effort normal. Cardiovascular system: Irregular.[name] No JVD, murmurs, rubs, gallops or clicks.  Gastrointestinal system: Abdomen is nondistended, soft and nontender. No organomegaly or masses felt. Normal bowel sounds heard. Central nervous system: Alert and oriented. No focal neurological deficits. Extremities:  Chronic bilateral lower extremity venous stasis Skin: No rashes, lesions or ulcers Psychiatry:  Mood & affect appropriate.     Data Reviewed: I have personally reviewed following labs and imaging studies  CBC: Recent Labs  Lab 11/07/19 1323 11/08/19 0446  WBC 7.6 5.6  HGB 11.7* 11.7*  HCT 36.8* 35.0*  MCV 93.2 90.9  PLT 256 235   Basic Metabolic Panel: Recent Labs  Lab 11/07/19 1323 11/08/19 0446 11/09/19 0559  NA 137 133* 133*  K 3.2* 4.4 4.7  CL 97* 94* 94*  CO2 26 27 28   GLUCOSE 149* 193* 220*  BUN 5* 10 23*  CREATININE 0.92 0.99 1.05  CALCIUM 8.0* 8.0* 8.5*  MG  --  1.4* 1.9   GFR: Estimated Creatinine Clearance: 123.6 mL/min (by C-G formula based on SCr of 1.05 mg/dL). Liver Function Tests: Recent Labs  Lab 11/07/19 1323  AST 35  ALT 17  ALKPHOS 79  BILITOT 0.7  PROT 7.3  ALBUMIN 3.2*   No results for input(s): LIPASE, AMYLASE in the last 168 hours. No results for input(s): AMMONIA in the last 168 hours. Coagulation Profile: Recent Labs  Lab 11/07/19 1323 11/08/19 0446 11/09/19 0559  INR 1.8* 1.5* 1.6*   Cardiac Enzymes: No results for input(s): CKTOTAL, CKMB, CKMBINDEX, TROPONINI in the last 168 hours. BNP (last 3 results) No results for input(s): PROBNP in the last 8760 hours. HbA1C: Recent Labs    11/08/19 0446  HGBA1C 6.2*   CBG: No results for input(s): GLUCAP in the last 168 hours. Lipid Profile: Recent Labs    11/08/19 0446  CHOL 175  HDL 75  LDLCALC 87  TRIG 63  CHOLHDL 2.3   Thyroid Function Tests: No results for input(s): TSH, T4TOTAL, FREET4, T3FREE, THYROIDAB in the last 72 hours. Anemia Panel: No results for input(s): VITAMINB12, FOLATE, FERRITIN, TIBC, IRON, RETICCTPCT in the last 72 hours. Sepsis Labs: No results for input(s): PROCALCITON, LATICACIDVEN in the last 168 hours.  Recent Results (from the past 240 hour(s))  SARS Coronavirus 2 by RT PCR (hospital order, performed in [facility] hospital lab)  Nasopharyngeal Nasopharyngeal Swab     Status: None   Collection Time: 11/07/19  1:23 PM   Specimen: Nasopharyngeal Swab  Result Value Ref Range Status   SARS Coronavirus 2 NEGATIVE NEGATIVE Final    Comment: (NOTE) SARS-CoV-2 target nucleic acids are NOT DETECTED.  The SARS-CoV-2 RNA is generally detectable in upper and lower respiratory specimens during the acute phase of infection. The lowest concentration of SARS-CoV-2 viral copies this assay can detect is 250 copies / mL. A negative result does not preclude SARS-CoV-2 infection and should not be used as the sole basis for treatment or other patient management decisions.  A negative result may occur with improper specimen collection / handling, submission of specimen other than nasopharyngeal swab, presence of viral mutation(s) within the areas targeted by this assay, and inadequate number of viral copies (<250 copies / mL). A negative result must be combined with  clinical observations, patient history, and epidemiological information.  Fact Sheet for Patients:   BoilerBrush.com.cy  Fact Sheet for Healthcare Providers: https://pope.com/  This test is not yet approved or  cleared by the [place] FDA and has been authorized for detection and/or diagnosis of SARS-CoV-2 by FDA under an Emergency Use Authorization (EUA).  This EUA will remain in effect (meaning this test can be used) for the duration of the COVID-19 declaration under Section 564(b)(1) of the Act, 21 U.S.C. section 360bbb-3(b)(1), unless the authorization is terminated or revoked sooner.  Performed at [facility], [address] [place], [place] [address]          Radiology Studies: US Venous Img Lower Unilateral Left (DVT)  Result Date: 11/07/2019 CLINICAL DATA:  Left lower leg pain and swelling for 2 days EXAM: LEFT LOWER EXTREMITY VENOUS DOPPLER ULTRASOUND TECHNIQUE: Gray-scale sonography  with graded compression, as well as color Doppler and duplex ultrasound were performed to evaluate the lower extremity deep venous systems from the level of the common femoral vein and including the common femoral, femoral, profunda femoral, popliteal and calf veins including the posterior tibial, peroneal and gastrocnemius veins when visible. The superficial great saphenous vein was also interrogated. Spectral Doppler was utilized to evaluate flow at rest and with distal augmentation maneuvers in the common femoral, femoral and popliteal veins. COMPARISON:  None. FINDINGS: Contralateral Common Femoral Vein: Respiratory phasicity is normal and symmetric with the symptomatic side. No evidence of thrombus. Normal compressibility. Common Femoral Vein: No evidence of thrombus. Normal compressibility, respiratory phasicity and response to augmentation. Saphenofemoral Junction: No evidence of thrombus. Normal compressibility and flow on color Doppler imaging. Profunda Femoral Vein: No evidence of thrombus. Normal compressibility and flow on color Doppler imaging. Femoral Vein: No evidence of thrombus. Normal compressibility, respiratory phasicity and response to augmentation. Popliteal Vein: No evidence of thrombus. Normal compressibility, respiratory phasicity and response to augmentation. Calf Veins: No evidence of thrombus. Normal compressibility and flow on color Doppler imaging. Superficial Great Saphenous Vein: No evidence of thrombus. Normal compressibility. Venous Reflux:  None. Other Findings:  None. IMPRESSION: No evidence of deep venous thrombosis. Electronically Signed   By: Alcide Clever M.D.   On: 11/07/2019 21:31        Scheduled Meds: . allopurinol  100 mg Oral Daily  . atorvastatin  40 mg Oral Daily  . DULoxetine  20 mg Oral Daily  . ferrous sulfate  325 mg Oral BID WC  . folic acid  1 mg Oral Daily  . furosemide  80 mg Oral BID  . insulin aspart  0-9 Units Subcutaneous TID WC  .  ipratropium-albuterol  3 mL Nebulization Q6H  . levothyroxine  50 mcg Oral QAC breakfast  . lisinopril  2.5 mg Oral Daily  . LORazepam  0-4 mg Intravenous Q6H   Followed by  . LORazepam  0-4 mg Intravenous Q12H  . methylPREDNISolone (SOLU-MEDROL) injection  60 mg Intravenous Q12H  . metoprolol succinate  50 mg Oral Daily  . montelukast  10 mg Oral QHS  . multivitamin with minerals  1 tablet Oral Daily  . sodium chloride flush  3 mL Intravenous Q12H  . thiamine  100 mg Oral Daily   Or  . thiamine  100 mg Intravenous Daily  . traZODone  150 mg Oral QHS  . warfarin  7.5 mg Oral ONCE-1600  . Warfarin - Pharmacist Dosing Inpatient   Does not apply q1600   Continuous Infusions: . sodium chloride  LOS: 2 days    Time spent: 28 minutes    Marrion Coy, MD [facility]   To contact the attending provider between 7A-7P or the covering provider during after hours 7P-7A, please log into the web site [email or web address] and access using universal [place] password for that web site. If you do not have the password, please call the hospital operator.  11/09/2019, 3:18 PM

## 2019-11-09 NOTE — Progress Notes (Signed)
ANTICOAGULATION CONSULT NOTE - Initial Consult  Pharmacy Consult for Warfarin Indication: atrial fibrillation  No Known Allergies  Patient Measurements: Height: 5\' 8"  (172.7 cm) Weight: (!) 186 kg (410 lb) IBW/kg (Calculated) : 68.4 Heparin Dosing Weight:    Vital Signs: Temp: 98.4 F (36.9 C) (09/24 0735) Temp Source: Oral (09/24 0735) BP: 122/67 (09/24 0735) Pulse Rate: 74 (09/24 0835)  Labs: Recent Labs    11/07/19 1323 11/07/19 1323 11/07/19 1850 11/07/19 2145 11/08/19 0142 11/08/19 0446 11/09/19 0559  HGB 11.7*  --   --   --   --  11.7*  --   HCT 36.8*  --   --   --   --  35.0*  --   PLT 256  --   --   --   --  235  --   LABPROT 20.0*  --   --   --   --  17.8* 18.7*  INR 1.8*  --   --   --   --  1.5* 1.6*  CREATININE 0.92  --   --   --   --  0.99 1.05  TROPONINIHS 34*   < > 37* 31* 25*  --   --    < > = values in this interval not displayed.    Estimated Creatinine Clearance: 123.6 mL/min (by C-G formula based on SCr of 1.05 mg/dL).   Medical History: Past Medical History:  Diagnosis Date  . Allergy   . Anxiety   . Arthritis   . Asthma   . Brain damage   . Chronic pain of both knees 07/13/2018  . Clotting disorder (HCC)   . COPD (chronic obstructive pulmonary disease) (HCC)   . Depression   . GERD (gastroesophageal reflux disease)   . HFrEF (heart failure with reduced ejection fraction) (HCC)    a. 03/2018 Echo: EF 25-30%, diff HK. Mod LAE.  04/2018 History of DVT (deep vein thrombosis)   . History of pulmonary embolism    a. Chronic coumadin.  [name] Hypertension   . MI (myocardial infarction) (HCC)   . Morbid obesity (HCC)   . Neck pain 07/13/2018  . NICM (nonischemic cardiomyopathy) (HCC)    a. s/p Cath x 3 - reportedly nl cors. Last cath 2019 in [place]; b. a. 03/2018 Echo: EF 25-30%, diff HK.  04/2018 Persistent atrial fibrillation (HCC)    a. 03/2018 s/p DCCV; b. CHA2DS2VASc = 1-->Xarelto (later changed to warfarin); c. 05/2018 recurrent afib-->Amio initiated.  .  Sleep apnea   . Sleep apnea     Home regimen: Warfarin 5mg  daily  Assessment: Patient is a 59yo male presenting with SOB. PMH dCHF, HTN, HLD, DM, COPD, hypothyroidism, Afib and hx of DVT/PE and takes Warfarin outpatient. Pharmacy consulted to manage warfarin dosing.   9/22 59yo negative for DVT Hgb 11.7>11.7 Plt 256>235  Date INR Dose 9/22 1.8 5mg  9/23 1.5 7.5mg  9/24 1.6   Goal of Therapy:  INR 2-3   Plan:  INR is subtherapeutic at 1.6. Will order Warfarin 7.5mg  again tonight.  Monitor daily INR and CBC a minimum of every 3 days per protocol.  , PharmD Pharmacy Resident  11/09/2019 9:44 AM

## 2019-11-09 NOTE — Progress Notes (Signed)
PT Cancellation Note  Patient Details Name: [name] [name] MRN: [id number] DOB: Oct 19, 1960   Cancelled Treatment:    Reason Eval/Treat Not Completed: Pain limiting ability to participate Pt reports he does not feel like doing any walking today and really is hurting too much to even do exercises.  Sites general joint pain as well as splitting headache, nursing is aware and meds have been administered.  Malachi Pro, DPT 11/09/2019, 4:45 PM

## 2019-11-10 LAB — BASIC METABOLIC PANEL
Anion gap: 9 (ref 5–15)
BUN: 24 mg/dL — ABNORMAL HIGH (ref 6–20)
CO2: 33 mmol/L — ABNORMAL HIGH (ref 22–32)
Calcium: 8.5 mg/dL — ABNORMAL LOW (ref 8.9–10.3)
Chloride: 94 mmol/L — ABNORMAL LOW (ref 98–111)
Creatinine, Ser: 0.99 mg/dL (ref 0.61–1.24)
GFR calc Af Amer: >60 mL/min (ref >60)
GFR calc non Af Amer: >60 mL/min (ref >60)
Glucose, Bld: 191 mg/dL — ABNORMAL HIGH (ref 70–99)
Potassium: 3.8 mmol/L (ref 3.5–5.1)
Sodium: 136 mmol/L (ref 135–145)

## 2019-11-10 LAB — CBC
HCT: 38.7 % — ABNORMAL LOW (ref 39.0–52.0)
Hemoglobin: 12.2 g/dL — ABNORMAL LOW (ref 13.0–17.0)
MCH: 29.9 pg (ref 26.0–34.0)
MCHC: 31.5 g/dL (ref 30.0–36.0)
MCV: 94.9 fL (ref 80.0–100.0)
Platelets: 211 10*3/uL (ref 150–400)
RBC: 4.08 MIL/uL — ABNORMAL LOW (ref 4.22–5.81)
RDW: 17.1 % — ABNORMAL HIGH (ref 11.5–15.5)
WBC: 8.5 10*3/uL (ref 4.0–10.5)
nRBC: 0 % (ref 0.0–0.2)

## 2019-11-10 LAB — PROTIME-INR
INR: 2 — ABNORMAL HIGH (ref 0.8–1.2)
Prothrombin Time: 22.1 seconds — ABNORMAL HIGH (ref 11.4–15.2)

## 2019-11-10 LAB — GLUCOSE, CAPILLARY
Glucose-Capillary: 174 mg/dL — ABNORMAL HIGH (ref 70–99)
Glucose-Capillary: 108 mg/dL — ABNORMAL HIGH (ref 70–99)

## 2019-11-10 MED ORDER — WARFARIN SODIUM 5 MG PO TABS
5.0000 mg | ORAL_TABLET | Freq: Once | ORAL | Status: DC
  Filled 2019-11-10: qty 1

## 2019-11-10 MED ORDER — PREDNISONE 10 MG PO TABS: ORAL_TABLET | ORAL | 0 refills | Status: AC

## 2019-11-10 NOTE — Discharge Instructions (Signed)
Chronic Obstructive Pulmonary Disease Chronic obstructive pulmonary disease (COPD) is a long-term (chronic) lung problem. When you have COPD, it is hard for air to get in and out of your lungs. Usually the condition gets worse over time, and your lungs will never return to normal. There are things you can do to keep yourself as healthy as possible.  Your doctor may treat your condition with: ? Medicines. ? Oxygen. ? Lung surgery.  Your doctor may also recommend: ? Rehabilitation. This includes steps to make your body work better. It may involve a team of specialists. ? Quitting smoking, if you smoke. ? Exercise and changes to your diet. ? Comfort measures (palliative care). Follow these instructions at home: Medicines  Take over-the-counter and prescription medicines only as told by your doctor.  Talk to your doctor before taking any cough or allergy medicines. You may need to avoid medicines that cause your lungs to be dry. Lifestyle  If you smoke, stop. Smoking makes the problem worse. If you need help quitting, ask your doctor.  Avoid being around things that make your breathing worse. This may include smoke, chemicals, and fumes.  Stay active, but remember to rest as well.  Learn and use tips on how to relax.  Make sure you get enough sleep. Most adults need at least 7 hours of sleep every night.  Eat healthy foods. Eat smaller meals more often. Rest before meals. Controlled breathing Learn and use tips on how to control your breathing as told by your doctor. Try:  Breathing in (inhaling) through your nose for 1 second. Then, pucker your lips and breath out (exhale) through your lips for 2 seconds.  Putting one hand on your belly (abdomen). Breathe in slowly through your nose for 1 second. Your hand on your belly should move out. Pucker your lips and breathe out slowly through your lips. Your hand on your belly should move in as you breathe out.  Controlled  coughing Learn and use controlled coughing to clear mucus from your lungs. Follow these steps: 1. Lean your head a little forward. 2. Breathe in deeply. 3. Try to hold your breath for 3 seconds. 4. Keep your mouth slightly open while coughing 2 times. 5. Spit any mucus out into a tissue. 6. Rest and do the steps again 1 or 2 times as needed. General instructions  Make sure you get all the shots (vaccines) that your doctor recommends. Ask your doctor about a flu shot and a pneumonia shot.  Use oxygen therapy and pulmonary rehabilitation if told by your doctor. If you need home oxygen therapy, ask your doctor if you should buy a tool to measure your oxygen level (oximeter).  Make a COPD action plan with your doctor. This helps you to know what to do if you feel worse than usual.  Manage any other conditions you have as told by your doctor.  Avoid going outside when it is very hot, cold, or humid.  Avoid people who have a sickness you can catch (contagious).  Keep all follow-up visits as told by your doctor. This is important. Contact a doctor if:  You cough up more mucus than usual.  There is a change in the color or thickness of the mucus.  It is harder to breathe than usual.  Your breathing is faster than usual.  You have trouble sleeping.  You need to use your medicines more often than usual.  You have trouble doing your normal activities such as getting  dressed or walking around the house. Get help right away if:  You have shortness of breath while resting.  You have shortness of breath that stops you from: ? Being able to talk. ? Doing normal activities.  Your chest hurts for longer than 5 minutes.  Your skin color is more blue than usual.  Your pulse oximeter shows that you have low oxygen for longer than 5 minutes.  You have a fever.  You feel too tired to breathe normally. Summary  Chronic obstructive pulmonary disease (COPD) is a long-term lung  problem.  The way your lungs work will never return to normal. Usually the condition gets worse over time. There are things you can do to keep yourself as healthy as possible.  Take over-the-counter and prescription medicines only as told by your doctor.  If you smoke, stop. Smoking makes the problem worse. This information is not intended to replace advice given to you by your health care provider. Make sure you discuss any questions you have with your health care provider. Document Revised: 01/14/2017 Document Reviewed: 03/08/2016 Elsevier Patient Education  2020 Elsevier Inc. 1.  Low-salt diet, fluid restriction less than 1500 ml/day 2.  Follow-up with your family doctor in 1 week. 3.  Follow-up with your cardiologist as scheduled.

## 2019-11-10 NOTE — Progress Notes (Signed)
Patient requested to have wound care done to legs after his bathe this morning. Will endorse to oncoming RN

## 2019-11-10 NOTE — Progress Notes (Signed)
   11/10/19 0617  Assess: MEWS Score  Temp 98 F (36.7 C)  BP 121/68  Pulse Rate 97  ECG Heart Rate (!) 110  Resp 19  Level of Consciousness Alert  SpO2 100 %  O2 Device Nasal Cannula  Patient Activity (if Appropriate) In bed  O2 Flow Rate (L/min) 4 L/min  Assess: MEWS Score  MEWS Temp 0  MEWS Systolic 0  MEWS Pulse 1  MEWS RR 0  MEWS LOC 0  MEWS Score 1  MEWS Score Color [place] Si

## 2019-11-10 NOTE — Discharge Summary (Signed)
Physician Discharge Summary  Patient ID: [name] [name] MRN: [id number] DOB/AGE: 08-15-1960 59 y.o.  Admit date: 11/07/2019 Discharge date: 11/10/2019  Admission Diagnoses:  Discharge Diagnoses:  Principal Problem:   Acute on chronic respiratory failure with hypoxia (HCC) Active Problems:   Acute on chronic diastolic CHF (congestive heart failure) (HCC)   OSA on CPAP   Persistent atrial fibrillation (HCC)   HTN (hypertension)   Personal history of DVT (deep vein thrombosis)   Chest pain   PE (pulmonary thromboembolism) (HCC)   HLD (hyperlipidemia)   CAD (coronary artery disease)   Hypothyroidism   Depression   Elevated troponin   Alcohol abuse   COPD exacerbation (HCC)   Discharged Condition: fair  Hospital Course:  [name] Hubbardis a 59 y.o.malewith medical history significant ofdCHF,hypertension, hyperlipidemia, diet-controlled diabetes, COPD, hypothyroidism, gout, depression, anxiety, OSA on CPAP, atrial fibrillation, PE/DVT on Coumadin, CAD, alcohol abuse, obesity, who presents with shortness of breath. His chest x-ray showed vascular congestion, BNP not significantly elevated. He was diagnosed with exacerbation of congestive heart failure and COPD. He is started on IV Lasix and IV steroids. Patient states that he still has signal short of breath with exertion, but he has reached his baseline.  I spoke with Child psychotherapist, patient has run out of oxygen at home, will set up oxygen tank again.  #1.  Acute on chronic respiratory failure with hypoxemia.  Secondary to exacerbation congestive heart failure and COPD. Oxygenation seem to be improved.  2.  Acute on chronic diastolic congestive heart failure. Resume home treatment.  Advised patient to restrict salt intake, fluid restriction.  3.  COPD exacerbation. Patient has decreased breathing sounds, but still no bronchospasm.    Continue steroid taper.  #4.  Obstructive sleep apnea, morbid obesity, obesity  hypoventilation syndrome. This appears to be the cause of his chronic short of breath.  Continue CPAP.  5.  Permanent atrial fibrillation. Heart rate controlled.  On warfarin.  6.  History of PE. Continue warfarin.  7.  Alcohol abuse. No withdrawal.   Consults: None  Significant Diagnostic Studies: PORTABLE CHEST 1 VIEW  COMPARISON:  September 01, 2019  FINDINGS: There is cardiomegaly with pulmonary venous hypertension. No edema or airspace opacity. No adenopathy. No bone lesions.  IMPRESSION: Cardiomegaly with pulmonary vascular congestion. No edema or airspace consolidation.   Electronically Signed   By: Bretta Bang III M.D.   On: 11/07/2019 13:37  LEFT LOWER EXTREMITY VENOUS DOPPLER ULTRASOUND  TECHNIQUE: Gray-scale sonography with graded compression, as well as color Doppler and duplex ultrasound were performed to evaluate the lower extremity deep venous systems from the level of the common femoral vein and including the common femoral, femoral, profunda femoral, popliteal and calf veins including the posterior tibial, peroneal and gastrocnemius veins when visible. The superficial great saphenous vein was also interrogated. Spectral Doppler was utilized to evaluate flow at rest and with distal augmentation maneuvers in the common femoral, femoral and popliteal veins.  COMPARISON:  None.  FINDINGS: Contralateral Common Femoral Vein: Respiratory phasicity is normal and symmetric with the symptomatic side. No evidence of thrombus. Normal compressibility.  Common Femoral Vein: No evidence of thrombus. Normal compressibility, respiratory phasicity and response to augmentation.  Saphenofemoral Junction: No evidence of thrombus. Normal compressibility and flow on color Doppler imaging.  Profunda Femoral Vein: No evidence of thrombus. Normal compressibility and flow on color Doppler imaging.  Femoral Vein: No evidence of thrombus. Normal  compressibility, respiratory phasicity and response to augmentation.  Popliteal Vein: No evidence  of thrombus. Normal compressibility, respiratory phasicity and response to augmentation.  Calf Veins: No evidence of thrombus. Normal compressibility and flow on color Doppler imaging.  Superficial Great Saphenous Vein: No evidence of thrombus. Normal compressibility.  Venous Reflux:  None.  Other Findings:  None.  IMPRESSION: No evidence of deep venous thrombosis.   Electronically Signed   By: Alcide Clever M.D.   On: 11/07/2019 21:31   Treatments: Solu-Medrol and Lasix.  Discharge Exam: Blood pressure 124/81, pulse 97, temperature 98.2 F (36.8 C), temperature source Oral, resp. rate 19, height 5\' 8"  (1.727 m), weight (!) 189.3 kg, SpO2 97 %. General appearance: alert and cooperative Resp: Decreased breathing sounds without wheezes or crackles. Cardio: regular rate and rhythm, S1, S2 normal, no murmur, click, rub or gallop GI: soft, non-tender; bowel sounds normal; no masses,  no organomegaly Extremities: Chronic venous stasis.  Disposition: Discharge disposition: 01-Home or Self Care       Discharge Instructions    Diet - low sodium heart healthy   Complete by: As directed    Discharge wound care:   Complete by: As directed    Follow with wound care.   Increase activity slowly   Complete by: As directed      Allergies as of 11/10/2019   No Known Allergies     Medication List    TAKE these medications   albuterol 108 (90 Base) MCG/ACT inhaler Commonly known as: VENTOLIN HFA Inhale 2 puffs into the lungs every 4 (four) hours as needed for wheezing or shortness of breath.   allopurinol 100 MG tablet Commonly known as: ZYLOPRIM TAKE 1 TABLET BY MOUTH ONCE DAILY   atorvastatin 40 MG tablet Commonly known as: LIPITOR TAKE 1 TABLET BY MOUTH ONCE DAILY   DULoxetine 20 MG capsule Commonly known as: CYMBALTA Take 1 capsule (20 mg total) by mouth  daily.   ferrous sulfate 325 (65 FE) MG tablet Take 1 tablet (325 mg total) by mouth 2 (two) times daily with a meal.   fluticasone 50 MCG/ACT nasal spray Commonly known as: FLONASE Place 2 sprays into both nostrils 2 (two) times daily.   levothyroxine 50 MCG tablet Commonly known as: SYNTHROID Take 1 tablet (50 mcg total) by mouth daily before breakfast.   lisinopril 2.5 MG tablet Commonly known as: ZESTRIL Take 1 tablet (2.5 mg total) by mouth daily.   loperamide 2 MG capsule Commonly known as: IMODIUM Take 1 capsule (2 mg total) by mouth as needed for diarrhea or loose stools.   metoprolol succinate 50 MG 24 hr tablet Commonly known as: TOPROL-XL Take 1 tablet (50 mg total) by mouth daily. Take with or immediately following a meal.   montelukast 10 MG tablet Commonly known as: SINGULAIR Take 1 tablet (10 mg total) by mouth at bedtime.   naphazoline-glycerin 0.012-0.2 % Soln Commonly known as: CLEAR EYES REDNESS Place 1-2 drops into both eyes 4 (four) times daily as needed for eye irritation.   nitroGLYCERIN 0.4 MG SL tablet Commonly known as: NITROSTAT Place 1 tablet under tongue every 5 minutes as needed for chest pain. (No more than 3 doses within 15 minutes)   predniSONE 10 MG tablet Commonly known as: DELTASONE Take 4 tablets (40 mg total) by mouth daily for 3 days, THEN 2 tablets (20 mg total) daily for 3 days, THEN 1 tablet (10 mg total) daily for 3 days. Start taking on: November 10, 2019   thiamine 100 MG tablet Take 1 tablet (100 mg total) by  mouth daily.   torsemide 20 MG tablet Commonly known as: DEMADEX Take 1 tablet (20 mg total) by mouth 2 (two) times daily. What changed: how much to take   traZODone 150 MG tablet Commonly known as: DESYREL Take 1 tablet (150 mg total) by mouth at bedtime.   warfarin 5 MG tablet Commonly known as: COUMADIN Take 5 mg by mouth daily.            Durable Medical Equipment  (From admission, onward)          Start     Ordered   11/10/19 1021  For home use only DME oxygen  Once       Question Answer Comment  Length of Need 6 Months   Mode or (Route) Nasal cannula   Liters per Minute 3   Frequency Continuous (stationary and portable oxygen unit needed)   Oxygen conserving device Yes   Oxygen delivery system Gas      11/10/19 1021           Discharge Care Instructions  (From admission, onward)         Start     Ordered   11/10/19 0000  Discharge wound care:       Comments: Follow with wound care.   11/10/19 1024          Follow-up Information    [facility] Follow up on 11/22/2019.   Specialty: Cardiology Why: at 10:00am. Enter through the [facility] entrance Contact information: [address] [place] [address] [phone]       [name], DO Follow up in 1 week(s).   Specialty: Family Medicine Contact information: [address] [place] [address] [phone]        [name], MD .   Specialty: Cardiology Contact information: [address] [place] [address] [phone]              36 minutes  Signed: Marrion Coy 11/10/2019, 10:24 AM

## 2019-11-10 NOTE — Progress Notes (Addendum)
   11/10/19 1205  Vitals  Temp 98.3 F (36.8 C)  Temp Source Oral  BP (!) 132/99  MAP (mmHg) 109  BP Location Left Arm  BP Method Automatic  Patient Position (if appropriate) Sitting  Pulse Rate (!) 137  Resp 19  MEWS COLOR  MEWS Score Color Yellow  Oxygen Therapy  SpO2 99 %  O2 Device Nasal Cannula  Pain Assessment  Pain Scale 0-10  Pain Score 6  Pain Type Chronic pain  Pain Location Head  Pain Orientation Medial  Pain Descriptors / Indicators Aching  Pain Frequency Constant  Pain Onset On-going  Patients Stated Pain Goal 0  Pain Intervention(s) Medication (See eMAR)  Multiple Pain Sites No  Complaints & Interventions  Complains of Anxiety  Neuro symptoms relieved by Anti-anxiety medication  MEWS Score  MEWS Temp 0  MEWS Systolic 0  MEWS Pulse 3  MEWS RR 0  MEWS LOC 0  MEWS Score 3   Notified charge nurse Larose Hires, RN.  Ativan given per CIWA scale. Focused assessment completed.  Will continue to follow MEWS protocol.  Ulis Rias, RN 11/10/2019 12:41 PM

## 2019-11-10 NOTE — Progress Notes (Addendum)
ANTICOAGULATION CONSULT NOTE - Initial Consult  Pharmacy Consult for Warfarin Indication: atrial fibrillation  No Known Allergies  Patient Measurements: Height: 5\' 8"  (172.7 cm) Weight: (!) 189.3 kg (417 lb 6.4 oz) IBW/kg (Calculated) : 68.4 Heparin Dosing Weight:    Vital Signs: Temp: 98.2 F (36.8 C) (09/25 0751) Temp Source: Oral (09/25 0751) BP: 124/81 (09/25 0751) Pulse Rate: 97 (09/25 0751)  Labs: Recent Labs    11/07/19 1323 11/07/19 1323 11/07/19 1850 11/07/19 2145 11/08/19 0142 11/08/19 0446 11/09/19 0559 11/10/19 0646  HGB 11.7*   < >  --   --   --  11.7*  --  12.2*  HCT 36.8*  --   --   --   --  35.0*  --  38.7*  PLT 256  --   --   --   --  235  --  211  LABPROT 20.0*   < >  --   --   --  17.8* 18.7* 22.1*  INR 1.8*   < >  --   --   --  1.5* 1.6* 2.0*  CREATININE 0.92   < >  --   --   --  0.99 1.05 0.99  TROPONINIHS 34*   < > 37* 31* 25*  --   --   --    < > = values in this interval not displayed.    Estimated Creatinine Clearance: 132.7 mL/min (by C-G formula based on SCr of 0.99 mg/dL).   Medical History: Past Medical History:  Diagnosis Date  . Allergy   . Anxiety   . Arthritis   . Asthma   . Brain damage   . Chronic pain of both knees 07/13/2018  . Clotting disorder (HCC)   . COPD (chronic obstructive pulmonary disease) (HCC)   . Depression   . GERD (gastroesophageal reflux disease)   . HFrEF (heart failure with reduced ejection fraction) (HCC)    a. 03/2018 Echo: EF 25-30%, diff HK. Mod LAE.  04/2018 History of DVT (deep vein thrombosis)   . History of pulmonary embolism    a. Chronic coumadin.  [name] Hypertension   . MI (myocardial infarction) (HCC)   . Morbid obesity (HCC)   . Neck pain 07/13/2018  . NICM (nonischemic cardiomyopathy) (HCC)    a. s/p Cath x 3 - reportedly nl cors. Last cath 2019 in [place]; b. a. 03/2018 Echo: EF 25-30%, diff HK.  04/2018 Persistent atrial fibrillation (HCC)    a. 03/2018 s/p DCCV; b. CHA2DS2VASc = 1-->Xarelto (later  changed to warfarin); c. 05/2018 recurrent afib-->Amio initiated.  . Sleep apnea   . Sleep apnea     Home regimen: Warfarin 5mg  daily  Assessment: Patient is a 59yo male presenting with SOB. PMH dCHF, HTN, HLD, DM, COPD, hypothyroidism, Afib and hx of DVT/PE and takes Warfarin outpatient. Pharmacy consulted to manage warfarin dosing. DDI: pred 50 mg daily (inc INR), and allopurinol.   Date INR Dose 9/22 1.8 5mg  9/23 1.5 7.5mg  9/24 1.6 7.5mg  9/25 2.0 5mg   Goal of Therapy:  INR 2-3   Plan:  INR is therapeutic. Will order Warfarin 5mg  x 1 tonight (home dose).   Monitor daily INR and CBC a minimum of every 3 days per protocol.  10/23, PharmD 11/10/2019 9:59 AM

## 2019-11-10 NOTE — TOC Progression Note (Signed)
LCSW spoke with Jermaine at [facility], to confirm delivery of an O2 tank for home use to the room for patient's discharge.    Corlis Hove, LCSW Transitions of Care Dept.  [phone]

## 2019-11-10 NOTE — Progress Notes (Signed)
   11/10/19 0600  Assess: MEWS Score  ECG Heart Rate (!) 114  Resp (!) 23  Assess: MEWS Score  MEWS Temp 0  MEWS Systolic 0  MEWS Pulse 2  MEWS RR 1  MEWS LOC 0  MEWS Score 3  MEWS Score Color Yellow  Assess: if the MEWS score is Yellow or Red  Were vital signs taken at a resting state? No  Focused Assessment Change from prior assessment (see assessment flowsheet)  Early Detection of Sepsis Score *See Row Information* Medium  MEWS guidelines implemented *See Row Information* No, other (Comment) (Patient's CIWA is being addressed)  Treat  Neuro symptoms relieved by Anti-anxiety medication;Music;Relaxation techniques (Comment)  Patients response to intervention Relief  Notify: Charge Nurse/RN  Name of Charge Nurse/RN Notified Alisa   Date Charge Nurse/RN Notified 11/10/19  Time Charge Nurse/RN Notified 0600  Document  Patient Outcome Stabilized after interventions  Progress note created (see row info) Yes

## 2019-11-10 NOTE — Progress Notes (Signed)
RN notified by CCMD that patient's HR was 191. When RN arrived to patient's room, patient was walking to the recliner bedside chair with not oxygen nor walker and appeared flushed and SOB. . RN assisted patient to the chair. Applied 3L , Per patient, he just finished using the restroom and wanted to sit up in the chair. Patient asked RN for [place] sandwich and 2 little cartons of 2% milk. Patient also reported generalized pain including a HA. CIWA was noted 23. PRN ativan and PRN percocet administered. With positive effects noted. Will continue to monitor v/s per MEWS guidelines.

## 2019-11-13 ENCOUNTER — Ambulatory Visit: Payer: Medicaid Other | Admitting: Family Medicine

## 2019-11-14 ENCOUNTER — Other Ambulatory Visit (HOSPITAL_COMMUNITY): Payer: Self-pay

## 2019-11-14 ENCOUNTER — Encounter (HOSPITAL_COMMUNITY): Payer: Self-pay

## 2019-11-14 NOTE — Progress Notes (Signed)
Today had a home visit with [name] [name].  While talking with him yesterday he advised his food stamps has ran out and need to refile for them and dont know how.  He also advised he does not have much food at the home.  Took him some food and during our visit filled out the paper work for assistance.  He states has not heard form Child psychotherapist and advised him they should be calling tomorrow at 1:00.  Advised him he also has HF clinic appt next week and he needs to make that appt and we could refer him to Palliative care to get more assistance at home. He states been round and round to get help back in his home for past 9 months.  He has no one coming in to help right now.  He has wound care clinic appt tomorrow and has set up transportation.  His legs are wrapped but are sliding down, his legs from what I can see appears to be healing and swelling has went down from before.  He has went to his PCP appt and has another appt in 2 weeks.  He denies any chest pain, more shortness of breath than his normal, headaches or dizziness.  He is able to stand and pivot to get form his wheel chair to a chair.  He sits mostly in a computer chair during the day watching his computer or tv.  He sleeps in the bed at night.  He states able to do simple things around the house.  He has a sister that helps with laundry and some things around the house, but she can not come every week.  He has no way to elevate his legs when out of the bed.  He is not watching high sodium foods due to cost and trying to watch amount of fluids. Will try to assist in any way I can, will fax application in to get him some food stamp assistance.  Will continue to visit for heart failure.   [name] EMT-Paramedic [phone]

## 2019-11-15 ENCOUNTER — Other Ambulatory Visit: Payer: Self-pay

## 2019-11-15 ENCOUNTER — Encounter: Payer: Medicaid Other | Admitting: Physician Assistant

## 2019-11-15 ENCOUNTER — Telehealth: Payer: Medicaid Other

## 2019-11-15 ENCOUNTER — Telehealth: Payer: Self-pay | Admitting: Licensed Clinical Social Worker

## 2019-11-15 DIAGNOSIS — Z86711 Personal history of pulmonary embolism: Secondary | ICD-10-CM | POA: Diagnosis not present

## 2019-11-15 DIAGNOSIS — I872 Venous insufficiency (chronic) (peripheral): Secondary | ICD-10-CM | POA: Diagnosis not present

## 2019-11-15 DIAGNOSIS — I5042 Chronic combined systolic (congestive) and diastolic (congestive) heart failure: Secondary | ICD-10-CM | POA: Diagnosis not present

## 2019-11-15 DIAGNOSIS — I890 Lymphedema, not elsewhere classified: Secondary | ICD-10-CM | POA: Diagnosis not present

## 2019-11-15 DIAGNOSIS — Z7901 Long term (current) use of anticoagulants: Secondary | ICD-10-CM | POA: Diagnosis not present

## 2019-11-15 DIAGNOSIS — I480 Paroxysmal atrial fibrillation: Secondary | ICD-10-CM | POA: Diagnosis not present

## 2019-11-15 DIAGNOSIS — G4730 Sleep apnea, unspecified: Secondary | ICD-10-CM | POA: Diagnosis not present

## 2019-11-15 DIAGNOSIS — J449 Chronic obstructive pulmonary disease, unspecified: Secondary | ICD-10-CM | POA: Diagnosis not present

## 2019-11-15 DIAGNOSIS — L97812 Non-pressure chronic ulcer of other part of right lower leg with fat layer exposed: Secondary | ICD-10-CM | POA: Diagnosis not present

## 2019-11-15 DIAGNOSIS — E11622 Type 2 diabetes mellitus with other skin ulcer: Secondary | ICD-10-CM | POA: Diagnosis not present

## 2019-11-15 DIAGNOSIS — L97822 Non-pressure chronic ulcer of other part of left lower leg with fat layer exposed: Secondary | ICD-10-CM | POA: Diagnosis not present

## 2019-11-15 NOTE — Progress Notes (Addendum)
YOBANY, VROOM ([id number]) Visit Report for 11/15/2019 Chief Complaint Document Details Patient Name: [name] [name], [name] [name] Date of Service: 11/15/2019 11:00 AM Medical Record Number: [id number] Patient Account Number: [id number] Date of Birth/Sex: 14-Feb-1961 (59 y.o. M) Treating RN: Cornell Barman Primary Care Provider: Nobie Putnam Other Clinician: Referring Provider: Nobie Putnam Treating Provider/Extender: Melburn Hake, Angad Nabers Weeks in Treatment: 52 Information Obtained from: Patient Chief Complaint Bilateral LE ulcers Electronic Signature(s) Signed: 11/15/2019 11:20:58 AM By: Worthy Keeler PA-C Entered By: Worthy Keeler on 11/15/2019 11:20:58 ZAIAH, ECKERSON ([id number]) -------------------------------------------------------------------------------- HPI Details Patient Name: [name] [name] Date of Service: 11/15/2019 11:00 AM Medical Record Number: [id number] Patient Account Number: [id number] Date of Birth/Sex: Feb 23, 1960 (59 y.o. M) Treating RN: Cornell Barman Primary Care Provider: Nobie Putnam Other Clinician: Referring Provider: Nobie Putnam Treating Provider/Extender: Melburn Hake, Camella Seim Weeks in Treatment: 20 History of Present Illness HPI Description: 01/18/2019 on evaluation today patient presents for initial evaluation here in our clinic concerning issues he is been having with his bilateral lower extremities. He appears based on what I am seeing to have bilateral lower extremity cellulitis along with chronic venous stasis. He has significant pitting edema even in the bottom of his foot. With that being said he also has a history of hypertension, COPD, congestive heart failure, long-term use of anticoagulants due to atrial fibrillation which is paroxysmal as well as having had a pulmonary embolism in the past. With that being said he tells me that he has constant drainage out of his left lower extremity. This is likely due to the fact that again he  has significant swelling pretty much all the time. With that being said in the past 2 weeks this has been getting worse and he tells me at this point that he feels like it is infected is also hurting a lot more than it has been in the past and the drainage is a different color. The left side is definitely worse than the right. Fortunately there is no signs of active infection systemically although I am concerned due to the severity of the infection that I see in the left lower extremity especially. There is a lot of necrotic tissue on the surface of both wounds that at some point is going to need to be cleaned off to allow these to heal but right now obviously this is much too tender for him. No fevers, chills, nausea, vomiting, or diarrhea. 02/02/2019 on evaluation today patient presents for follow-up concerning the wounds over his bilateral lower extremities. He is continuing to manage. He was in the hospital and has been discharged at this point. He was admitted from 01/21/2019 through 01/27/2019. This was due to cellulitis of lower extremity. Nonetheless he does seem to be doing much better at this point. I did review his discharge summary as well he did not appear to have any positive findings on blood cultures. His white blood cell count was never really elevated either which is also good news. He was treated with IV clindamycin for 4 days and then switch to complete a 10-day course at home which she is still working on at this point. They did have him on IV Lasix in the hospital which helped him diurese well on discharge they stated that his weight was 408 pounds which is still higher than his dry weight of 375 to 380 pounds. Upon discharge his torsemide was increased from 40 to 60 mg twice daily and he is to follow-up with cardiology as well. 12/29-Patient returns at 2 weeks  for evaluation and follow-up of his bilateral lower extremity wounds, with lymphedema, venous ulcerations, patient was  recently hospitalized and was aggressively diuresed after which his torsemide has been increased. He follows up with cardiology as well. His left leg stasis wounds are more moist and have maceration of tissue, the right one is less so. We are using silver cell and unna compression given the degree of edema in the legs. Readmission: 05/04/2019 patient presents today for reevaluation here in the clinic I have not seen him since December 2020. Subsequently this is a fairly sick individual with multiple comorbidities who presents for follow-up concerning wounds of his bilateral lower extremities. He tells me that after he was in the hospital following when we saw him last in December that he ended up subsequently being discharged with home health services and they have been coming out and applying Unna boot wraps. With that being said he has used up all of his home health visits for the year and then for the last week has not had anything on his legs as far as the wrap is concerned. Obviously this is not good and he does come in today with increased swelling or at least what I perceived to be along with somewhat dry wounds again he does not seem to be weeping too much at this point which is at least good news but he has significant lymphedema/venous stasis. No fevers, chills, nausea, vomiting, or diarrhea. 06/01/2019 upon evaluation today patient is seen after not having been seen actually since 19 2021. He kept coming for appointment with every time he would come his temperature would be elevated past the point where we could see him. I told him that he needed to go be seen at the ER during 1 of these times due to the fact that obviously this was a fever of unknown origin at that point and my concern was that it could potentially be his legs but again I was not entirely sure as obviously I could not see them due to the restrictions secondary to Covid even bringing the patient into the clinic. Nonetheless  he finally came in today his temperature again was elevated but not to the same degree that it met the 100.4 or above guidelines of having to cancel the appointment. Either way we did have a look at his legs unfortunately he appears to show signs of cellulitis at this point which is probably where the temperature elevations are emanating from. He has significant lymphedema as well as chronic vent venous stasis as well. When I initially told him that is why I told him to go to the ER previously when his temperature was elevated in case it was something with wounds or otherwise he replied "nobody ever told me to go to the ER". I then subsequently reminded him of me coming to the front desk due to his temperature being elevated and advising him to go to the ER to which she replied "I am not going to the ER and I do not think I need to even with this". Nonetheless I am concerned about the infection of his legs at this point he has significant weeping, significant edema, and overall is not doing very well in my opinion. 4/23; the patient arrives in clinic today having napkin stuck on his wounds. He is taking the antibiotic from last week. Last Wednesday he had his INR checked at the Coumadin clinic it was apparently "sky high" and his Coumadin is now on hold.  He is going back to check this again on Monday. His legs were not put back in compression last week out of fear of the extending infection with fever. He has been taking the antibiotics. Culture from last week showed Enterobacter and methicillin resistant staph aureus. He is on on doxycycline which the MRSA was sensitive to Enterobacter was not plated but given the pattern of sensitivities probably the doxycycline was satisfactory. I will extend the doxycycline another 4 days until he is seen next week. From an infection point of view this looks better 06/14/2019 upon evaluation today patient unfortunately continues to have signs of cellulitis on the  lower extremities bilaterally especially on the left. Unfortunately he doesn't seem to be doing much better despite being on doxycycline for a couple of weeks. He was positive for MRSA as well as Enterobacter. Obviously the MRSA is treated by way of the doxycycline the Enterobacter unfortunately is not I'm going to see about initiating Bactrim for him at this point. 5/13; since he was last here he was hospitalized from 5/7 through 5/11. Mostly this was for congestive heart failure. He has an ejection fraction of 25 to 30%. His torsemide dose was increased. While he was there they dealt with the infection in the right leg. He did not take his Bactrim that he was prescribed last time. He was treated with linezolid in the hospital and cefepime and discharged on Bactrim to complete a 2-week course. He was supposed to follow-up with the Coumadin clinic at his cardiologist's office [Dr. End). He has not done this. He tells me he is on Coumadino 5 mg twice a day. The last INR I see is from 5/11 when he was discharged from the hospital it was 2.1 however it was increasing every day they check this. He was thought to have cellulitis from methicillin-resistant staph aureus. [name] [name], [name] [name] ([id number]) 5/21; patient's wound on the right lower leg actually looks a lot better although not much change in size. We used Iodoflex under an Haematologist last week. The [employer] stayed in place. 07/20/2019 upon evaluation today patient actually appears to be doing much better in regard to his legs in general. He does not have the infection that was previously noted which is great news he was in the hospital which he finally did go after things got significantly worse. Fortunately he seems to be doing much better in regard to his overall appearance today in that regard. With that being said he still is experiencing issues currently with wounds over the lower extremities and his wraps do tend to slip down. We probably do need  to use the Unna paste which I think we are doing here but I am not sure the home health nurses are using right now we may need to send this order along as well. 07/27/2019 upon evaluation today patient appears to be doing excellent in regard to his lower extremities all things considered. He still has significant edema noted but the compression wraps do seem to be helping and overall after getting his infection under good control I feel like things have improved dramatically. There does not appear to be any signs of active infection at this time. No fevers, chills, nausea, vomiting, or diarrhea. Readmission: 10/30/2019 patient presents today for readmission here in the clinic concerning issues that he has been having with his bilateral lower extremities. He tells me after last time he was in the hospital he actually had improvement of his legs to everything got  under much better control was actually doing quite well. Nonetheless right now he has blisters that reopened he tells me about a month ago and has not really been able to get them close. He does not have any compression stockings that he is wearing at this point he came in really been nothing on the wounds currently. 11/15/2019 on evaluation today patient appears to be doing well with regard to his leg ulcers. He has been tolerating the dressing changes without complication. Fortunately there is no evidence of active infection at this time. Electronic Signature(s) Signed: 11/15/2019 1:22:47 PM By: Worthy Keeler PA-C Entered By: Worthy Keeler on 11/15/2019 13:22:47 [name] [name], [name] [name] ([id number]) -------------------------------------------------------------------------------- Physical Exam Details Patient Name: [name] [name] Date of Service: 11/15/2019 11:00 AM Medical Record Number: [id number] Patient Account Number: [id number] Date of Birth/Sex: [birth year] (59 y.o. M) Treating RN: Cornell Barman Primary Care Provider: Nobie Putnam  Other Clinician: Referring Provider: Nobie Putnam Treating Provider/Extender: Melburn Hake, Kion Huntsberry Weeks in Treatment: 38 Constitutional Obese and well-hydrated in no acute distress. Respiratory normal breathing without difficulty. Psychiatric this patient is able to make decisions and demonstrates good insight into disease process. Alert and Oriented x 3. pleasant and cooperative. Notes Patient's wound bed showed signs of good granulation epithelization in both locations 1 on the right 1 on the left. Overall I feel like he is making great progress and I am extremely pleased with what we see today. I do believe we should continue with the alginate as well as the compression wraps. Electronic Signature(s) Signed: 11/15/2019 1:23:07 PM By: Worthy Keeler PA-C Entered By: Worthy Keeler on 11/15/2019 13:23:06 [name] [name] ([id number]) -------------------------------------------------------------------------------- Physician Orders Details Patient Name: [name] [name] Date of Service: 11/15/2019 11:00 AM Medical Record Number: [id number] Patient Account Number: [id number] Date of Birth/Sex: 03-Jul-1960 (59 y.o. M) Treating RN: Grover Canavan Primary Care Provider: Nobie Putnam Other Clinician: Referring Provider: Nobie Putnam Treating Provider/Extender: Melburn Hake, Kaysa Roulhac Weeks in Treatment: 3 Verbal / Phone Orders: No Diagnosis Coding ICD-10 Coding Code Description I87.2 Venous insufficiency (chronic) (peripheral) I89.0 Lymphedema, not elsewhere classified L97.812 Non-pressure chronic ulcer of other part of right lower leg with fat layer exposed L97.822 Non-pressure chronic ulcer of other part of left lower leg with fat layer exposed [place] (primary) hypertension J44.9 Chronic obstructive pulmonary disease, unspecified Z79.01 Long term (current) use of anticoagulants I48.0 Paroxysmal atrial fibrillation Z86.711 Personal history of pulmonary  embolism 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 o Dial antibacterial soap, wash wounds, rinse and pat dry prior to dressing wounds Wound #5 Left,Lateral Lower Leg o Clean wound with Normal Saline. o Dial antibacterial soap, wash wounds, rinse and pat dry prior to dressing wounds Wound #9 Right,Lateral Lower Leg o Clean wound with Normal Saline. o Dial antibacterial soap, wash wounds, rinse and pat dry prior to dressing wounds Primary Wound Dressing Wound #10 Right,Distal,Medial Lower Leg o Silver Alginate Wound #5 Left,Lateral Lower Leg o Silver Alginate Wound #9 Right,Lateral Lower Leg o Silver Alginate Secondary Dressing Wound #10 Right,Distal,Medial Lower Leg o XtraSorb Wound #5 Left,Lateral Lower Leg o XtraSorb Wound #9 Right,Lateral Lower Leg o XtraSorb Dressing Change Frequency Wound #10 Right,Distal,Medial Lower Leg o Dressing is to be changed Monday and Thursday. Wound #5 Left,Lateral Lower Leg o Dressing is to be changed Monday and Thursday. [name] [name], [name] [name] ([id number]) Wound #9 Right,Lateral Lower Leg   o Dressing is to be changed Monday and Thursday. Follow-up Appointments Wound #10 Right,Distal,Medial Lower Leg o Return Appointment in 2 weeks. Wound #5 Left,Lateral Lower Leg o Return Appointment in 2 weeks. Wound #9 Right,Lateral Lower Leg o Return Appointment in 2 weeks. Edema Control Wound #10 Right,Distal,Medial Lower Leg o 4 Layer Compression System - Bilateral o Elevate legs to the level of the heart and pump ankles as often as possible Wound #5 Left,Lateral Lower Leg o 4 Layer Compression System - Bilateral o Elevate legs to the level of the heart and pump ankles as often as possible Wound #9 Right,Lateral Lower  Leg o 4 Layer Compression System - Bilateral o Elevate legs to the level of the heart and pump ankles as often as possible Home Health Wound#10 [place] [facility] to change dressings on Mon and Thurs unless pt has appointment at the [facility]. o Home Health Nurse may visit PRN to address patientos wound care needs. o If current dressing causes regression in wound condition, may D/C ordered dressing product/s and apply Normal Saline Moist Dressing daily until next [place] / Other MD appointment. [place] of regression in wound condition at [phone]. o Please direct any NON-WOUND related issues/requests for orders to patient's Primary Care Physician Wound#5 [place] [facility] [place] to change dressings on Mon and Thurs unless pt has appointment at the [facility]. o Home Health Nurse may visit PRN to address patientos wound care needs. o If current dressing causes regression in wound condition, may D/C ordered dressing product/s and apply Normal Saline Moist Dressing daily until next [place] / Other MD appointment. [place] of regression in wound condition at [phone]. o Please direct any NON-WOUND related issues/requests for orders to patient's Primary Care Physician Wound#9 [place] for [place] to change dressings on Mon and Thurs unless pt has appointment at the [facility]. o Home Health Nurse may visit PRN to address patientos wound care needs. o If current dressing causes regression in wound condition, may D/C ordered dressing product/s and apply Normal Saline Moist Dressing daily until next [place] / Other MD appointment. [place] of regression in wound condition at [phone]. o Please direct any NON-WOUND related  issues/requests for orders to patient's Primary Care Physician Electronic Signature(s) Signed: 11/15/2019 4:22:43 PM By: Worthy Keeler PA-C Signed: 11/15/2019 4:23:15 PM By: Grover Canavan Entered By: Grover Canavan on 11/15/2019 11:55:39 CHIDERA, THIVIERGE ([id number]) -------------------------------------------------------------------------------- Problem List Details Patient Name: [name] [name] Date of Service: 11/15/2019 11:00 AM Medical Record Number: [id number] Patient Account Number: [id number] Date of Birth/Sex: 04-05-1960 (59 y.o. M) Treating RN: Cornell Barman Primary Care Provider: Nobie Putnam Other Clinician: Referring Provider: Nobie Putnam Treating Provider/Extender: Melburn Hake, [name] Weeks in Treatment: 41 Active Problems ICD-10 Encounter Code Description Active Date MDM Diagnosis I87.2 Venous insufficiency (chronic) (peripheral) 05/04/2019 No Yes I89.0 Lymphedema, not elsewhere classified 05/04/2019 No Yes L97.812 Non-pressure chronic ulcer of other part of right lower leg with fat layer 05/04/2019 No Yes exposed L97.822 Non-pressure chronic ulcer of other part of left lower leg with fat layer 05/04/2019 No Yes exposed [place] (primary) hypertension 05/04/2019 No Yes J44.9 Chronic obstructive pulmonary disease, unspecified 05/04/2019 No Yes Z79.01 Long term (current) use of anticoagulants 05/04/2019 No Yes I48.0 Paroxysmal atrial fibrillation 05/04/2019 No Yes Z86.711 Personal history of pulmonary  embolism 05/04/2019 No Yes I50.42 Chronic combined systolic (congestive) and diastolic (congestive) heart 05/04/2019 No Yes failure E11.622 Type 2 diabetes mellitus with other skin ulcer 10/30/2019 No Yes Inactive Problems Resolved Problems DEVRIN, MONFORTE ([id number]) Electronic Signature(s) Signed: 11/15/2019 11:20:52 AM By: Worthy Keeler PA-C Entered By: Worthy Keeler on 11/15/2019 11:20:51 [name] [name], [name] [name]  ([id number]) -------------------------------------------------------------------------------- Progress Note Details Patient Name: [name] [name] Date of Service: 11/15/2019 11:00 AM Medical Record Number: [id number] Patient Account Number: [id number] Date of Birth/Sex: 1960/08/22 (59 y.o. M) Treating RN: Cornell Barman Primary Care Provider: Nobie Putnam Other Clinician: Referring Provider: Nobie Putnam Treating Provider/Extender: Melburn Hake, Declynn Lopresti Weeks in Treatment: 8 Subjective Chief Complaint Information obtained from Patient Bilateral LE ulcers History of Present Illness (HPI) 01/18/2019 on evaluation today patient presents for initial evaluation here in our clinic concerning issues he is been having with his bilateral lower extremities. He appears based on what I am seeing to have bilateral lower extremity cellulitis along with chronic venous stasis. He has significant pitting edema even in the bottom of his foot. With that being said he also has a history of hypertension, COPD, congestive heart failure, long-term use of anticoagulants due to atrial fibrillation which is paroxysmal as well as having had a pulmonary embolism in the past. With that being said he tells me that he has constant drainage out of his left lower extremity. This is likely due to the fact that again he has significant swelling pretty much all the time. With that being said in the past 2 weeks this has been getting worse and he tells me at this point that he feels like it is infected is also hurting a lot more than it has been in the past and the drainage is a different color. The left side is definitely worse than the right. Fortunately there is no signs of active infection systemically although I am concerned due to the severity of the infection that I see in the left lower extremity especially. There is a lot of necrotic tissue on the surface of both wounds that at some point is going to need to be  cleaned off to allow these to heal but right now obviously this is much too tender for him. No fevers, chills, nausea, vomiting, or diarrhea. 02/02/2019 on evaluation today patient presents for follow-up concerning the wounds over his bilateral lower extremities. He is continuing to manage. He was in the hospital and has been discharged at this point. He was admitted from 01/21/2019 through 01/27/2019. This was due to cellulitis of lower extremity. Nonetheless he does seem to be doing much better at this point. I did review his discharge summary as well he did not appear to have any positive findings on blood cultures. His white blood cell count was never really elevated either which is also good news. He was treated with IV clindamycin for 4 days and then switch to complete a 10-day course at home which she is still working on at this point. They did have him on IV Lasix in the hospital which helped him diurese well on discharge they stated that his weight was 408 pounds which is still higher than his dry weight of 375 to 380 pounds. Upon discharge his torsemide was increased from 40 to 60 mg twice daily and he is to follow-up with cardiology as well. 12/29-Patient returns at 2 weeks for evaluation and follow-up of his bilateral lower extremity wounds, with lymphedema, venous ulcerations, patient was recently hospitalized and was aggressively diuresed  after which his torsemide has been increased. He follows up with cardiology as well. His left leg stasis wounds are more moist and have maceration of tissue, the right one is less so. We are using silver cell and unna compression given the degree of edema in the legs. Readmission: 05/04/2019 patient presents today for reevaluation here in the clinic I have not seen him since December 2020. Subsequently this is a fairly sick individual with multiple comorbidities who presents for follow-up concerning wounds of his bilateral lower extremities. He tells me  that after he was in the hospital following when we saw him last in December that he ended up subsequently being discharged with home health services and they have been coming out and applying Unna boot wraps. With that being said he has used up all of his home health visits for the year and then for the last week has not had anything on his legs as far as the wrap is concerned. Obviously this is not good and he does come in today with increased swelling or at least what I perceived to be along with somewhat dry wounds again he does not seem to be weeping too much at this point which is at least good news but he has significant lymphedema/venous stasis. No fevers, chills, nausea, vomiting, or diarrhea. 06/01/2019 upon evaluation today patient is seen after not having been seen actually since 19 2021. He kept coming for appointment with every time he would come his temperature would be elevated past the point where we could see him. I told him that he needed to go be seen at the ER during 1 of these times due to the fact that obviously this was a fever of unknown origin at that point and my concern was that it could potentially be his legs but again I was not entirely sure as obviously I could not see them due to the restrictions secondary to Covid even bringing the patient into the clinic. Nonetheless he finally came in today his temperature again was elevated but not to the same degree that it met the 100.4 or above guidelines of having to cancel the appointment. Either way we did have a look at his legs unfortunately he appears to show signs of cellulitis at this point which is probably where the temperature elevations are emanating from. He has significant lymphedema as well as chronic vent venous stasis as well. When I initially told him that is why I told him to go to the ER previously when his temperature was elevated in case it was something with wounds or otherwise he replied "nobody ever told  me to go to the ER". I then subsequently reminded him of me coming to the front desk due to his temperature being elevated and advising him to go to the ER to which she replied "I am not going to the ER and I do not think I need to even with this". Nonetheless I am concerned about the infection of his legs at this point he has significant weeping, significant edema, and overall is not doing very well in my opinion. 4/23; the patient arrives in clinic today having napkin stuck on his wounds. He is taking the antibiotic from last week. Last Wednesday he had his INR checked at the Coumadin clinic it was apparently "sky high" and his Coumadin is now on hold. He is going back to check this again on Monday. His legs were not put back in compression last week out of  fear of the extending infection with fever. He has been taking the antibiotics. Culture from last week showed Enterobacter and methicillin resistant staph aureus. He is on on doxycycline which the MRSA was sensitive to Enterobacter was not plated but given the pattern of sensitivities probably the doxycycline was satisfactory. I will extend the doxycycline another 4 days until he is seen next week. From an infection point of view this looks better 06/14/2019 upon evaluation today patient unfortunately continues to have signs of cellulitis on the lower extremities bilaterally especially on the left. Unfortunately he doesn't seem to be doing much better despite being on doxycycline for a couple of weeks. He was positive for MRSA as well as Enterobacter. Obviously the MRSA is treated by way of the doxycycline the Enterobacter unfortunately is not I'm going to see about initiating Bactrim for him at this point. 5/13; since he was last here he was hospitalized from 5/7 through 5/11. Mostly this was for congestive heart failure. He has an ejection fraction of [name] [name], [name] [name] ([id number]) 25 to 30%. His torsemide dose was increased. While he was there  they dealt with the infection in the right leg. He did not take his Bactrim that he was prescribed last time. He was treated with linezolid in the hospital and cefepime and discharged on Bactrim to complete a 2-week course. He was supposed to follow-up with the Coumadin clinic at his cardiologist's office [Dr. End). He has not done this. He tells me he is on Coumadino 5 mg twice a day. The last INR I see is from 5/11 when he was discharged from the hospital it was 2.1 however it was increasing every day they check this. He was thought to have cellulitis from methicillin-resistant staph aureus. 5/21; patient's wound on the right lower leg actually looks a lot better although not much change in size. We used Iodoflex under an Haematologist last week. The [employer] stayed in place. 07/20/2019 upon evaluation today patient actually appears to be doing much better in regard to his legs in general. He does not have the infection that was previously noted which is great news he was in the hospital which he finally did go after things got significantly worse. Fortunately he seems to be doing much better in regard to his overall appearance today in that regard. With that being said he still is experiencing issues currently with wounds over the lower extremities and his wraps do tend to slip down. We probably do need to use the Unna paste which I think we are doing here but I am not sure the home health nurses are using right now we may need to send this order along as well. 07/27/2019 upon evaluation today patient appears to be doing excellent in regard to his lower extremities all things considered. He still has significant edema noted but the compression wraps do seem to be helping and overall after getting his infection under good control I feel like things have improved dramatically. There does not appear to be any signs of active infection at this time. No fevers, chills, nausea, vomiting,  or diarrhea. Readmission: 10/30/2019 patient presents today for readmission here in the clinic concerning issues that he has been having with his bilateral lower extremities. He tells me after last time he was in the hospital he actually had improvement of his legs to everything got under much better control was actually doing quite well. Nonetheless right now he has blisters that reopened he tells me about a  month ago and has not really been able to get them close. He does not have any compression stockings that he is wearing at this point he came in really been nothing on the wounds currently. 11/15/2019 on evaluation today patient appears to be doing well with regard to his leg ulcers. He has been tolerating the dressing changes without complication. Fortunately there is no evidence of active infection at this time. Objective Constitutional Obese and well-hydrated in no acute distress. Vitals Time Taken: 10:45 AM, Height: 68 in, Weight: 400 lbs, BMI: 60.8, Temperature: 99 F, Pulse: 51 bpm, Respiratory Rate: 26 breaths/min, Blood Pressure: 141/88 mmHg. Respiratory normal breathing without difficulty. Psychiatric this patient is able to make decisions and demonstrates good insight into disease process. Alert and Oriented x 3. pleasant and cooperative. General Notes: Patient's wound bed showed signs of good granulation epithelization in both locations 1 on the right 1 on the left. Overall I feel like he is making great progress and I am extremely pleased with what we see today. I do believe we should continue with the alginate as well as the compression wraps. Integumentary (Hair, Skin) Wound #10 status is Open. Original cause of wound was Gradually Appeared. The wound is located on the Right,Distal,Medial Lower Leg. The wound measures 1cm length x 1.3cm width x 0.1cm depth; 1.021cm^2 area and 0.102cm^3 volume. There is Fat Layer (Subcutaneous Tissue) exposed. There is no tunneling or  undermining noted. There is no granulation within the wound bed. There is a large (67-100%) amount of necrotic tissue within the wound bed including Adherent Slough. Wound #5 status is Open. Original cause of wound was Gradually Appeared. The wound is located on the Left,Lateral Lower Leg. The wound measures 4.5cm length x 8cm width x 0.1cm depth; 28.274cm^2 area and 2.827cm^3 volume. There is Fat Layer (Subcutaneous Tissue) exposed. There is a large amount of serous drainage noted. The wound margin is flat and intact. There is medium (34-66%) pink granulation within the wound bed. There is a medium (34-66%) amount of necrotic tissue within the wound bed including Adherent Slough. Wound #9 status is Open. Original cause of wound was Gradually Appeared. The wound is located on the Right,Lateral Lower Leg. The wound measures 0.4cm length x 0.4cm width x 0cm depth; 0.126cm^2 area and 0.013cm^3 volume. There is Fat Layer (Subcutaneous Tissue) exposed. There is a large amount of serous drainage noted. The wound margin is flat and intact. There is small (1-33%) red granulation within the wound bed. There is a large (67-100%) amount of necrotic tissue within the wound bed including Adherent Slough. 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 There was a Four Layer Compression Therapy Procedure by Grover Canavan, RN. Post procedure Diagnosis Wound #10: Same as Pre-Procedure Wound #5 Pre-procedure diagnosis of Wound #5 is a Lymphedema located on the Left,Lateral Lower Leg . There was a Four Layer Compression Therapy Procedure by Grover Canavan, RN. Post procedure Diagnosis Wound #5:  Same as Pre-Procedure Plan Wound Cleansing: Wound #10 Right,Distal,Medial Lower Leg: Clean wound with Normal Saline. Dial antibacterial soap, wash wounds, rinse and pat dry prior to dressing wounds Wound #5 Left,Lateral Lower Leg: Clean wound with Normal Saline. Dial antibacterial soap, wash wounds, rinse and pat dry prior to dressing wounds Wound #9 Right,Lateral Lower Leg: Clean wound with Normal Saline. Dial antibacterial soap, wash wounds, rinse and pat dry prior to dressing wounds Primary Wound Dressing: Wound #10 Right,Distal,Medial Lower Leg: Silver Alginate Wound #5 Left,Lateral Lower Leg: Silver Alginate Wound #9 Right,Lateral Lower Leg: Silver Alginate Secondary Dressing: Wound #10 Right,Distal,Medial Lower Leg: XtraSorb Wound #5 Left,Lateral Lower Leg: XtraSorb Wound #9 Right,Lateral Lower Leg: XtraSorb Dressing Change Frequency: Wound #10 Right,Distal,Medial Lower Leg: Dressing is to be changed Monday and Thursday. Wound #5 Left,Lateral Lower Leg: Dressing is to be changed Monday and Thursday. Wound #9 Right,Lateral Lower Leg: Dressing is to be changed Monday and Thursday. Follow-up Appointments: Wound #10 Right,Distal,Medial Lower Leg: [name] [name], [name] [name] ([id number]) Return Appointment in 2 weeks. Wound #5 Left,Lateral Lower Leg: Return Appointment in 2 weeks. Wound #9 Right,Lateral Lower Leg: Return Appointment in 2 weeks. Edema Control: Wound #10 Right,Distal,Medial Lower Leg: 4 Layer Compression System - Bilateral Elevate legs to the level of the heart and pump  ankles as often as possible Wound #5 Left,Lateral Lower Leg: 4 Layer Compression System - Bilateral Elevate legs to the level of the heart and pump ankles as often as possible Wound #9 Right,Lateral Lower Leg: 4 Layer Compression System - Bilateral Elevate legs to the level of the heart and pump ankles as often as possible Home Health: Wound #10 Right,Distal,Medial Lower Leg: [place] [facility] to change dressings on Mon and Thurs unless pt has appointment at the [facility] Home Health Nurse may visit PRN to address patient s wound care needs. If current dressing causes regression in wound condition, may D/C ordered dressing product/s and apply Normal Saline Moist Dressing daily until next [place] / Other MD appointment. [place] of regression in wound condition at [phone]. Please direct any NON-WOUND related issues/requests for orders to patient's Primary Care Physician Wound #5 Left,Lateral Lower Leg: [place] for [place] to change dressings on Mon and Thurs unless pt has appointment at the [facility]. Home Health Nurse may visit PRN to address patient s wound care needs. If current dressing causes regression in wound condition, may D/C ordered dressing product/s and apply Normal Saline Moist Dressing daily until next [place] / Other MD appointment. [place] of regression in wound condition at [phone]. Please direct any NON-WOUND related issues/requests for orders to patient's Primary Care Physician Wound #9 Right,Lateral Lower Leg: [place] for [place] to change dressings on Mon and Thurs unless pt has appointment at the [facility]. Home Health Nurse may visit PRN to address patient s wound care needs. If current dressing causes regression in wound condition, may D/C ordered dressing product/s and apply Normal Saline Moist Dressing daily until next [place] / Other MD appointment. [place] of regression in wound condition at [phone]. Please direct any NON-WOUND related issues/requests for orders to patient's Primary Care Physician 1 I would recommend currently that we go ahead and continue with the silver alginate dressing to the open wound locations. 2. I am also can recommend that we continue with the 4-layer compression wraps bilaterally. 3. I would also suggest  that the patient continue to monitor for any signs of increased pain or otherwise indicate worsening of his symptoms. Right now overall he is not feeling anything and everything appears to be doing excellent. We will see patient back for reevaluation in 1 week here in the clinic. If anything worsens or changes patient will contact our office for additional recommendations. He does want to see if we can get home health for him before able to then we will transition him to an every 2-week follow-up. Electronic Signature(s) Signed: 11/15/2019 1:23:50 PM By: Worthy Keeler PA-C Entered By: Worthy Keeler on 11/15/2019 13:23:49 [name] [name], [name] [name] ([id number]) -------------------------------------------------------------------------------- SuperBill Details Patient Name: [name] [name] Date of Service: 11/15/2019 Medical Record Number: [id number] Patient Account Number: [id number] Date of Birth/Sex: Aug 22, 1960 (59 y.o. M) Treating RN: Grover Canavan Primary Care Provider: Nobie Putnam Other Clinician: Referring Provider: Nobie Putnam Treating Provider/Extender: Melburn Hake, Tacari Repass Weeks in Treatment: 27 Diagnosis Coding ICD-10 Codes Code Description I87.2 Venous insufficiency (chronic) (peripheral) I89.0 Lymphedema, not elsewhere classified L97.812 Non-pressure chronic ulcer of other part of right lower leg with fat layer exposed L97.822 Non-pressure chronic ulcer of other part of left lower leg with fat layer exposed I10 Essential  (primary) hypertension J44.9 Chronic obstructive pulmonary disease, unspecified Z79.01 Long term (current) use of anticoagulants I48.0 Paroxysmal atrial fibrillation Z86.711 Personal history of pulmonary embolism I50.42 Chronic combined systolic (congestive) and diastolic (congestive) heart failure E11.622 Type 2 diabetes mellitus with other skin ulcer Facility Procedures CPT4: Description Modifier Quantity Code [id number] BILATERAL: Application of multi-layer venous compression system; leg (below knee), including 1 ankle and foot. Physician Procedures CPT4 Code: [id number] Description: 02202 - WC PHYS LEVEL 3 - EST PT Modifier: Quantity: 1 CPT4 Code: Description: ICD-10 Diagnosis Description I87.2 Venous insufficiency (chronic) (peripheral) I89.0 Lymphedema, not elsewhere classified W69.167 Non-pressure chronic ulcer of other part of right lower leg with fat la L97.822 Non-pressure chronic ulcer of  other part of left lower leg with fat lay Modifier: yer exposed er exposed Quantity: Electronic Signature(s) Signed: 11/15/2019 1:24:41 PM By: Worthy Keeler PA-C Entered By: Worthy Keeler on 11/15/2019 13:24:40

## 2019-11-15 NOTE — Telephone Encounter (Signed)
Chronic Care Management    Clinical Social Work General Follow Up Note  11/15/2019 Name: Jerod Mcquain MRN: [id number] DOB: 07-07-1960  [name] [name] is a 59 y.o. year old male who is a primary care patient of [name], DO. The CCM team was consulted for assistance with [name] .   Review of patient status, including review of consultants reports, relevant laboratory and other test results, and collaboration with appropriate care team members and the patient's provider was performed as part of comprehensive patient evaluation and provision of chronic care management services.    LCSW completed CCM outreach attempt today but was unable to reach patient successfully. A HIPPA compliant voice message was left encouraging patient to return call once available. LCSW will ask Scheduling Care Guide to reschedule CCM SW appointment with patient as well.  Outpatient Encounter Medications as of 11/15/2019  Medication Sig  . albuterol (VENTOLIN HFA) 108 (90 Base) MCG/ACT inhaler Inhale 2 puffs into the lungs every 4 (four) hours as needed for wheezing or shortness of breath.  . allopurinol (ZYLOPRIM) 100 MG tablet TAKE 1 TABLET BY MOUTH ONCE DAILY (Patient taking differently: Take 100 mg by mouth daily. )  . atorvastatin (LIPITOR) 40 MG tablet TAKE 1 TABLET BY MOUTH ONCE DAILY (Patient taking differently: Take 40 mg by mouth daily. )  . DULoxetine (CYMBALTA) 20 MG capsule Take 1 capsule (20 mg total) by mouth daily.  . ferrous sulfate 325 (65 FE) MG tablet Take 1 tablet (325 mg total) by mouth 2 (two) times daily with a meal.  . fluticasone (FLONASE) 50 MCG/ACT nasal spray Place 2 sprays into both nostrils 2 (two) times daily. (Patient not taking: Reported on 10/24/2019)  . levothyroxine (SYNTHROID) 50 MCG tablet Take 1 tablet (50 mcg total) by mouth daily before breakfast.  . lisinopril (ZESTRIL) 2.5 MG tablet Take 1 tablet (2.5 mg total) by mouth daily. (Patient not taking:  Reported on 10/24/2019)  . loperamide (IMODIUM) 2 MG capsule Take 1 capsule (2 mg total) by mouth as needed for diarrhea or loose stools. (Patient not taking: Reported on 10/24/2019)  . metoprolol succinate (TOPROL-XL) 50 MG 24 hr tablet Take 1 tablet (50 mg total) by mouth daily. Take with or immediately following a meal.  . montelukast (SINGULAIR) 10 MG tablet Take 1 tablet (10 mg total) by mouth at bedtime.  . naphazoline-glycerin (CLEAR EYES REDNESS) 0.012-0.2 % SOLN Place 1-2 drops into both eyes 4 (four) times daily as needed for eye irritation. (Patient not taking: Reported on 10/24/2019)  . nitroGLYCERIN (NITROSTAT) 0.4 MG SL tablet Place 1 tablet under tongue every 5 minutes as needed for chest pain. (No more than 3 doses within 15 minutes) (Patient not taking: Reported on 10/24/2019)  . predniSONE (DELTASONE) 10 MG tablet Take 4 tablets (40 mg total) by mouth daily for 3 days, THEN 2 tablets (20 mg total) daily for 3 days, THEN 1 tablet (10 mg total) daily for 3 days.  [name] thiamine 100 MG tablet Take 1 tablet (100 mg total) by mouth daily. (Patient not taking: Reported on 10/24/2019)  . torsemide (DEMADEX) 20 MG tablet Take 1 tablet (20 mg total) by mouth 2 (two) times daily. (Patient taking differently: Take 40 mg by mouth 2 (two) times daily. )  . traZODone (DESYREL) 150 MG tablet Take 1 tablet (150 mg total) by mouth at bedtime.  [name] warfarin (COUMADIN) 5 MG tablet Take 5 mg by mouth daily.    No facility-administered encounter medications on file  as of 11/15/2019.    Follow Up Plan: Scheduling Care Guide will reach out to patient to reschedule appointment.   [name], BSW, MSW, LCSW [facility] [place]  [email or web address] Phone: [phone]

## 2019-11-19 NOTE — Telephone Encounter (Signed)
Pt has been r/s  

## 2019-11-20 ENCOUNTER — Ambulatory Visit: Payer: Medicaid Other | Admitting: Family

## 2019-11-22 ENCOUNTER — Other Ambulatory Visit: Payer: Self-pay

## 2019-11-22 ENCOUNTER — Encounter: Payer: Medicaid Other | Attending: Physician Assistant | Admitting: Physician Assistant

## 2019-11-22 ENCOUNTER — Ambulatory Visit: Payer: Medicaid Other | Admitting: Family

## 2019-11-22 DIAGNOSIS — J449 Chronic obstructive pulmonary disease, unspecified: Secondary | ICD-10-CM | POA: Insufficient documentation

## 2019-11-22 DIAGNOSIS — I480 Paroxysmal atrial fibrillation: Secondary | ICD-10-CM | POA: Insufficient documentation

## 2019-11-22 DIAGNOSIS — Z86711 Personal history of pulmonary embolism: Secondary | ICD-10-CM | POA: Insufficient documentation

## 2019-11-22 DIAGNOSIS — Z7901 Long term (current) use of anticoagulants: Secondary | ICD-10-CM | POA: Insufficient documentation

## 2019-11-22 DIAGNOSIS — I890 Lymphedema, not elsewhere classified: Secondary | ICD-10-CM | POA: Insufficient documentation

## 2019-11-22 DIAGNOSIS — L97812 Non-pressure chronic ulcer of other part of right lower leg with fat layer exposed: Secondary | ICD-10-CM | POA: Insufficient documentation

## 2019-11-22 DIAGNOSIS — Z8614 Personal history of Methicillin resistant Staphylococcus aureus infection: Secondary | ICD-10-CM | POA: Insufficient documentation

## 2019-11-22 DIAGNOSIS — I110 Hypertensive heart disease with heart failure: Secondary | ICD-10-CM | POA: Insufficient documentation

## 2019-11-22 DIAGNOSIS — I5042 Chronic combined systolic (congestive) and diastolic (congestive) heart failure: Secondary | ICD-10-CM | POA: Diagnosis not present

## 2019-11-22 DIAGNOSIS — I872 Venous insufficiency (chronic) (peripheral): Secondary | ICD-10-CM | POA: Insufficient documentation

## 2019-11-22 DIAGNOSIS — L97822 Non-pressure chronic ulcer of other part of left lower leg with fat layer exposed: Secondary | ICD-10-CM | POA: Insufficient documentation

## 2019-11-22 DIAGNOSIS — E11622 Type 2 diabetes mellitus with other skin ulcer: Secondary | ICD-10-CM | POA: Diagnosis not present

## 2019-11-22 NOTE — Progress Notes (Signed)
LEORN, FAIST ([id number]) Visit Report for 11/22/2019 Arrival Information Details Patient Name: [name] [name], [name] [name] Date of Service: 11/22/2019 2:30 PM Medical Record Number: [id number] Patient Account Number: [id number] Date of Birth/Sex: November 12, 1960 (59 y.o. M) Treating RN: Benna Dunks Primary Care Kaiden Pech: Saralyn Pilar Other Clinician: Referring Zayde Stroupe: Saralyn Pilar Treating Mystique Bjelland/Extender: Linwood Dibbles, HOYT Weeks in Treatment: 28 Visit Information History Since Last Visit All ordered tests and consults were completed: No Patient Arrived: Wheel Chair Added or deleted any medications: No Arrival Time: 14:58 Any new allergies or adverse reactions: No Accompanied By: self Had a fall or experienced change in No Transfer Assistance: None activities of daily living that may affect Patient Identification Verified: Yes risk of falls: Secondary Verification Process Completed: Yes Signs or symptoms of abuse/neglect since last visito No Patient Requires Transmission-Based No Hospitalized since last visit: No Precautions: Implantable device outside of the clinic excluding No Patient Has Alerts: Yes cellular tissue based products placed in the center Patient Alerts: Patient on Blood since last visit: Thinner Has Dressing in Place as Prescribed: Yes Warfarin Has Compression in Place as Prescribed: Yes Pain Present Now: No Electronic Signature(s) Signed: 11/22/2019 3:58:02 PM By: Benna Dunks Entered By: Benna Dunks on 11/22/2019 14:59:30 [name] [name] ([id number]) -------------------------------------------------------------------------------- Encounter Discharge Information Details Patient Name: [name] [name] Date of Service: 11/22/2019 2:30 PM Medical Record Number: [id number] Patient Account Number: [id number] Date of Birth/Sex: 1960/03/16 (59 y.o. M) Treating RN: Huel Coventry Primary Care Arber Wiemers: Saralyn Pilar Other  Clinician: Referring Mersadie Kavanaugh: Saralyn Pilar Treating Valory Wetherby/Extender: Linwood Dibbles, HOYT Weeks in Treatment: 64 Encounter Discharge Information Items Discharge Condition: Stable Ambulatory Status: Wheelchair Discharge Destination: Home Transportation: Private Auto Accompanied By: self Schedule Follow-up Appointment: No Clinical Summary of Care: Electronic Signature(s) Signed: 11/22/2019 5:05:57 PM By: Elliot Gurney, BSN, RN, CWS, Kim RN, BSN Entered By: Elliot Gurney, BSN, RN, CWS, Kim on 11/22/2019 15:25:18 [name] [name] ([id number]) -------------------------------------------------------------------------------- Lower Extremity Assessment Details Patient Name: [name] [name] Date of Service: 11/22/2019 2:30 PM Medical Record Number: [id number] Patient Account Number: [id number] Date of Birth/Sex: November 30, 1960 (59 y.o. M) Treating RN: Benna Dunks Primary Care Sita Mangen: Saralyn Pilar Other Clinician: Referring Shalicia Craghead: Saralyn Pilar Treating Carder Yin/Extender: Linwood Dibbles, HOYT Weeks in Treatment: 28 Edema Assessment Assessed: [Left: Yes] [Right: Yes] Edema: [Left: Yes] [Right: Yes] Calf Left: Right: Point of Measurement: 31 cm From Medial Instep 55 cm 53.8 cm Ankle Left: Right: Point of Measurement: 13 cm From Medial Instep 36 cm 34 cm Vascular Assessment Pulses: Dorsalis Pedis Palpable: [Left:Yes] [Right:Yes] Posterior Tibial Palpable: [Left:Yes] [Right:Yes] Popliteal Palpable: [Left:Yes] [Right:Yes] Electronic Signature(s) Signed: 11/22/2019 3:58:02 PM By: Benna Dunks Entered By: Benna Dunks on 11/22/2019 15:10:40 [name] [name] ([id number]) -------------------------------------------------------------------------------- Multi Wound Chart Details Patient Name: [name] [name] Date of Service: 11/22/2019 2:30 PM Medical Record Number: [id number] Patient Account Number: [id number] Date of Birth/Sex: Aug 20, 1960 (59 y.o. M) Treating RN: Huel Coventry Primary Care Omesha Bowerman: Saralyn Pilar Other Clinician: Referring Charlott Calvario: Saralyn Pilar Treating Yarenis Cerino/Extender: Linwood Dibbles, HOYT Weeks in Treatment: 28 Vital Signs Height(in): 68 Pulse(bpm): 64 Weight(lbs): 400 Blood Pressure(mmHg): 157/90 Body Mass Index(BMI): 61 Temperature(F): 98.7 Respiratory Rate(breaths/min): 24 Photos: Wound Location: Right, Distal, Medial Lower Leg Left, Lateral Lower Leg Right, Lateral Lower Leg Wounding Event: Gradually Appeared Gradually Appeared Gradually Appeared Primary Etiology: Venous Leg Ulcer Lymphedema Lymphedema Secondary Etiology: N/A 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 Comorbid History: Asthma, Chronic Obstructive Asthma, Chronic Obstructive Asthma, Chronic Obstructive Pulmonary Disease (COPD), Sleep Pulmonary Disease (COPD), Sleep Pulmonary Disease (COPD), Sleep Apnea, Arrhythmia, Congestive Apnea, Arrhythmia, Congestive Apnea, Arrhythmia,  Congestive Heart Failure, Hypertension, Heart Failure, Hypertension, Heart Failure, Hypertension, Myocardial Infarction, Type II Myocardial Infarction, Type II Myocardial Infarction, Type II Diabetes Diabetes Diabetes Date Acquired: 11/01/2019 02/12/2019 07/27/2019 Weeks of Treatment: 1 28 3  Wound Status: Open Open Healed - Epithelialized Clustered Wound: No Yes No Clustered Quantity: N/A 3 N/A Measurements L x W x D (cm) 1.5x1x0.1 4x6.5x0.1 0x0x0 Area (cm) : 1.178 20.42 0 Volume (cm) : 0.118 2.042 0 % Reduction in Area: -15.40% -3.20% 100.00% % Reduction in Volume: -15.70% -3.20% 100.00% Classification: Full Thickness Without Exposed Full Thickness Without Exposed Full Thickness Without Exposed Support Structures Support Structures Support Structures Exudate Amount: N/A Large Large Exudate Type: N/A Serous Serous Exudate Color: N/A amber amber Wound Margin: N/A Flat and Intact Flat and Intact Granulation Amount: None Present (0%) Medium (34-66%) Large  (67-100%) Granulation Quality: N/A Pink Red Necrotic Amount: Large (67-100%) Medium (34-66%) None Present (0%) Exposed Structures: Fat Layer (Subcutaneous Tissue): Fat Layer (Subcutaneous Tissue): Fascia: No Yes Yes Fat Layer (Subcutaneous Tissue): Fascia: No Fascia: No No Tendon: No Tendon: No Tendon: No Muscle: No Muscle: No Muscle: No Joint: No Joint: No Joint: No Bone: No Bone: No Bone: No Epithelialization: None None None Treatment Notes TAYQUAN, GASSMAN ([name] [name]) Electronic Signature(s) Signed: 11/22/2019 5:05:57 PM By: 01/22/2020, BSN, RN, CWS, Kim RN, BSN Entered By: Elliot Gurney, BSN, RN, CWS, Kim on 11/22/2019 15:21:02 01/22/2020 ([name] [name]) -------------------------------------------------------------------------------- Multi-Disciplinary Care Plan Details Patient Name: [id number] Date of Service: 11/22/2019 2:30 PM Medical Record Number: 01/22/2020 Patient Account Number: [id number] Date of Birth/Sex: [birth year] (59 y.o. M) Treating RN: 01-22-1983 Primary Care Kindell Strada: Huel Coventry Other Clinician: Referring Penne Rosenstock: Saralyn Pilar Treating Pixie Burgener/Extender: Saralyn Pilar, HOYT Weeks in Treatment: 27 Active Inactive Abuse / Safety / Falls / Self Care Management Nursing Diagnoses: Potential for falls Goals: Patient will remain injury free related to falls Date Initiated: 05/04/2019 Target Resolution Date: 07/21/2019 Goal Status: Active Interventions: Assess fall risk on admission and as needed Provide education on basic hygiene Notes: Nutrition Nursing Diagnoses: Impaired glucose control: actual or potential Goals: Patient/caregiver agrees to and verbalizes understanding of need to use nutritional supplements and/or vitamins as prescribed Date Initiated: 05/04/2019 Target Resolution Date: 07/21/2019 Goal Status: Active Interventions: Assess patient nutrition upon admission and as needed per policy Notes: Venous Leg Ulcer Nursing  Diagnoses: Potential for venous Insuffiency (use before diagnosis confirmed) Goals: Patient will maintain optimal edema control Date Initiated: 05/04/2019 Target Resolution Date: 07/21/2019 Goal Status: Active Interventions: Compression as ordered Notes: Wound/Skin Impairment Nursing Diagnoses: Impaired tissue integrity Goals: Ulcer/skin breakdown will heal within 14 weeks Date Initiated: 05/04/2019 Target Resolution Date: 07/21/2019 Goal Status: Active YURIY, CUI ([name] [name]) Interventions: Assess patient/caregiver ability to obtain necessary supplies Assess patient/caregiver ability to perform ulcer/skin care regimen upon admission and as needed Assess ulceration(s) every visit Notes: Electronic Signature(s) Signed: 11/22/2019 5:05:57 PM By: 01/22/2020, BSN, RN, CWS, Kim RN, BSN Entered By: Elliot Gurney, BSN, RN, CWS, Kim on 11/22/2019 15:20:37 01/22/2020 ([name] [name]) -------------------------------------------------------------------------------- Pain Assessment Details Patient Name: [id number] Date of Service: 11/22/2019 2:30 PM Medical Record Number: 01/22/2020 Patient Account Number: [id number] Date of Birth/Sex: 09/24/1960 (59 y.o. M) Treating RN: 01-22-1983 Primary Care Azaleah Usman: Benna Dunks Other Clinician: Referring Aloria Looper: Saralyn Pilar Treating Rick Carruthers/Extender: Saralyn Pilar, HOYT Weeks in Treatment: 28 Active Problems Location of Pain Severity and Description of Pain Patient Has Paino No Site Locations With Dressing Change: No Pain Management and Medication Current Pain Management: Electronic Signature(s) Signed: 11/22/2019 3:58:02 PM By: 01/22/2020 Entered By: Benna Dunks,  Okey Regal on 11/22/2019 15:00:13 MALIN, CERVINI ([id number]) -------------------------------------------------------------------------------- Patient/Caregiver Education Details Patient Name: [name] [name] Date of Service: 11/22/2019 2:30 PM Medical Record  Number: [id number] Patient Account Number: [id number] Date of Birth/Gender: 05-23-1960 (59 y.o. M) Treating RN: Huel Coventry Primary Care Physician: Saralyn Pilar Other Clinician: Referring Physician: Saralyn Pilar Treating Physician/Extender: Skeet Simmer in Treatment: 5 Education Assessment Education Provided To: Patient Education Topics Provided Wound/Skin Impairment: Handouts: Caring for Your Ulcer Methods: Demonstration, Explain/Verbal Responses: State content correctly Electronic Signature(s) Signed: 11/22/2019 5:05:57 PM By: Elliot Gurney, BSN, RN, CWS, Kim RN, BSN Entered By: Elliot Gurney, BSN, RN, CWS, Kim on 11/22/2019 15:24:33 [name] [name] ([id number]) -------------------------------------------------------------------------------- Wound Assessment Details Patient Name: [name] [name] Date of Service: 11/22/2019 2:30 PM Medical Record Number: [id number] Patient Account Number: [id number] Date of Birth/Sex: [birth year] (59 y.o. M) Treating RN: Benna Dunks Primary Care Guida Asman: Saralyn Pilar Other Clinician: Referring Agapita Savarino: Saralyn Pilar Treating Paschal Blanton/Extender: Linwood Dibbles, HOYT Weeks in Treatment: 28 Wound Status Wound Number: 10 Primary Venous Leg Ulcer Etiology: Wound Location: Right, Distal, Medial Lower Leg Wound Open Wounding Event: Gradually Appeared Status: Date Acquired: 11/01/2019 Comorbid Asthma, Chronic Obstructive Pulmonary Disease (COPD), Weeks Of Treatment: 1 History: Sleep Apnea, Arrhythmia, Congestive Heart Failure, Clustered Wound: No Hypertension, Myocardial Infarction, Type II Diabetes Photos Wound Measurements Length: (cm) 1.5 Width: (cm) 1 Depth: (cm) 0.1 Area: (cm) 1.178 Volume: (cm) 0.118 % Reduction in Area: -15.4% % Reduction in Volume: -15.7% Epithelialization: None Wound Description Classification: Full Thickness Without Exposed Support Structure s Foul Odor After Cleansing:  No Slough/Fibrino No Wound Bed Granulation Amount: None Present (0%) Exposed Structure Necrotic Amount: Large (67-100%) Fascia Exposed: No Necrotic Quality: Adherent Slough Fat Layer (Subcutaneous Tissue) Exposed: Yes Tendon Exposed: No Muscle Exposed: No Joint Exposed: No Bone Exposed: No Treatment Notes Wound #10 (Right, Distal, Medial Lower Leg) Notes scell, xsorb, 4LB, unna anchor Electronic Signature(s) Signed: 11/22/2019 3:58:02 PM By: Benna Dunks Entered By: Benna Dunks on 11/22/2019 15:08:46 [name] [name] ([id number] [name] ([id number]) -------------------------------------------------------------------------------- Wound Assessment Details Patient Name: [name] [name] Date of Service: 11/22/2019 2:30 PM Medical Record Number: [id number] Patient Account Number: [id number] Date of Birth/Sex: August 04, 1960 (59 y.o. M) Treating RN: Benna Dunks Primary Care Lashanta Elbe: Saralyn Pilar Other Clinician: Referring Jarian Longoria: Saralyn Pilar Treating Avelynn Sellin/Extender: Linwood Dibbles, HOYT Weeks in Treatment: 28 Wound Status Wound Number: 5 Primary Lymphedema Etiology: Wound Location: Left, Lateral Lower Leg Wound Open Wounding Event: Gradually Appeared Status: Date Acquired: 02/12/2019 Comorbid Asthma, Chronic Obstructive Pulmonary Disease (COPD), Weeks Of Treatment: 28 History: Sleep Apnea, Arrhythmia, Congestive Heart Failure, Clustered Wound: Yes Hypertension, Myocardial Infarction, Type II Diabetes Photos Wound Measurements Length: (cm) 4 Width: (cm) 6.5 Depth: (cm) 0.1 Clustered Quantity: 3 Area: (cm) 20.42 Volume: (cm) 2.042 % Reduction in Area: -3.2% % Reduction in Volume: -3.2% Epithelialization: None Wound Description Classification: Full Thickness Without Exposed Support Structu Wound Margin: Flat and Intact Exudate Amount: Large Exudate Type: Serous Exudate Color: amber res Foul Odor After Cleansing:  No Slough/Fibrino Yes Wound Bed Granulation Amount: Medium (34-66%) Exposed Structure Granulation Quality: Pink Fascia Exposed: No Necrotic Amount: Medium (34-66%) Fat Layer (Subcutaneous Tissue) Exposed: Yes Necrotic Quality: Adherent Slough Tendon Exposed: No Muscle Exposed: No Joint Exposed: No Bone Exposed: No Treatment Notes Wound #5 (Left, Lateral Lower Leg) Notes scell, xsorb, 4LB, unna anchor KACEY, VICUNA ([id number]) Electronic Signature(s) Signed: 11/22/2019 3:58:02 PM By: Benna Dunks Entered By: Benna Dunks on 11/22/2019 15:09:05 [name] [name] ([id number]) -------------------------------------------------------------------------------- Wound Assessment Details Patient Name: [name] [name] Date of Service: 11/22/2019 2:30 PM  Medical Record Number: [id number] Patient Account Number: [id number] Date of Birth/Sex: 01-25-1961 (59 y.o. M) Treating RN: Huel Coventry Primary Care Seher Schlagel: Saralyn Pilar Other Clinician: Referring Milton Sagona: Saralyn Pilar Treating Ashana Tullo/Extender: Linwood Dibbles, HOYT Weeks in Treatment: 28 Wound Status Wound Number: 9 Primary Lymphedema Etiology: Wound Location: Right, Lateral Lower Leg Secondary Diabetic Wound/Ulcer of the Lower Extremity Wounding Event: Gradually Appeared Etiology: Date Acquired: 07/27/2019 Wound Healed - Epithelialized Weeks Of Treatment: 3 Status: Clustered Wound: No Comorbid Asthma, Chronic Obstructive Pulmonary Disease (COPD), History: Sleep Apnea, Arrhythmia, Congestive Heart Failure, Hypertension, Myocardial Infarction, Type II Diabetes Photos Wound Measurements Length: (cm) 0 Width: (cm) 0 Depth: (cm) 0 Area: (cm) Volume: (cm) % Reduction in Area: 100% % Reduction in Volume: 100% Epithelialization: None 0 0 Wound Description Classification: Full Thickness Without Exposed Support Structu Wound Margin: Flat and Intact Exudate Amount: Large Exudate Type: Serous Exudate  Color: amber res Foul Odor After Cleansing: No Slough/Fibrino Yes Wound Bed Granulation Amount: Large (67-100%) Exposed Structure Granulation Quality: Red Fascia Exposed: No Necrotic Amount: None Present (0%) Fat Layer (Subcutaneous Tissue) Exposed: No Tendon Exposed: No Muscle Exposed: No Joint Exposed: No Bone Exposed: No Electronic Signature(s) Signed: 11/22/2019 5:05:57 PM By: Elliot Gurney, BSN, RN, CWS, Kim RN, BSN Entered By: Elliot Gurney, BSN, RN, CWS, Kim on 11/22/2019 15:20:12 [name] [name] ([id number]) -------------------------------------------------------------------------------- Vitals Details Patient Name: [name] [name] Date of Service: 11/22/2019 2:30 PM Medical Record Number: [id number] Patient Account Number: [id number] Date of Birth/Sex: May 15, 1960 (59 y.o. M) Treating RN: Benna Dunks Primary Care Alexius Hangartner: Saralyn Pilar Other Clinician: Referring Geneveive Furness: Saralyn Pilar Treating Auri Jahnke/Extender: Linwood Dibbles, HOYT Weeks in Treatment: 28 Vital Signs Time Taken: 02:35 Temperature (F): 98.7 Height (in): 68 Pulse (bpm): 64 Weight (lbs): 400 Respiratory Rate (breaths/min): 24 Body Mass Index (BMI): 60.8 Blood Pressure (mmHg): 157/90 Reference Range: 80 - 120 mg / dl Electronic Signature(s) Signed: 11/22/2019 3:58:02 PM By: Benna Dunks Entered By: Benna Dunks on 11/22/2019 15:00:02

## 2019-11-22 NOTE — Progress Notes (Addendum)
MONTE, ZINNI ([id number]) Visit Report for 11/22/2019 Chief Complaint Document Details Patient Name: [name] [name], [name] [name] Date of Service: 11/22/2019 2:30 PM Medical Record Number: [id number] Patient Account Number: [id number] Date of Birth/Sex: 1960-08-29 (59 y.o. M) Treating RN: Grover Canavan Primary Care Provider: Nobie Putnam Other Clinician: Referring Provider: Nobie Putnam Treating Provider/Extender: Melburn Hake, Keydi Giel Weeks in Treatment: 28 Information Obtained from: Patient Chief Complaint Bilateral LE ulcers Electronic Signature(s) Signed: 11/22/2019 2:13:48 PM By: Worthy Keeler PA-C Entered By: Worthy Keeler on 11/22/2019 14:13:47 [name] [name] ([id number]) -------------------------------------------------------------------------------- HPI Details Patient Name: [name] [name] Date of Service: 11/22/2019 2:30 PM Medical Record Number: [id number] Patient Account Number: [id number] Date of Birth/Sex: [birth year] (59 y.o. M) Treating RN: Grover Canavan Primary Care Provider: Nobie Putnam Other Clinician: Referring Provider: Nobie Putnam Treating Provider/Extender: Melburn Hake, Carvell Hoeffner Weeks in Treatment: 28 History of Present Illness HPI Description: 01/18/2019 on evaluation today patient presents for initial evaluation here in our clinic concerning issues he is been having with his bilateral lower extremities. He appears based on what I am seeing to have bilateral lower extremity cellulitis along with chronic venous stasis. He has significant pitting edema even in the bottom of his foot. With that being said he also has a history of hypertension, COPD, congestive heart failure, long-term use of anticoagulants due to atrial fibrillation which is paroxysmal as well as having had a pulmonary embolism in the past. With that being said he tells me that he has constant drainage out of his left lower extremity. This is likely due to the fact that  again he has significant swelling pretty much all the time. With that being said in the past 2 weeks this has been getting worse and he tells me at this point that he feels like it is infected is also hurting a lot more than it has been in the past and the drainage is a different color. The left side is definitely worse than the right. Fortunately there is no signs of active infection systemically although I am concerned due to the severity of the infection that I see in the left lower extremity especially. There is a lot of necrotic tissue on the surface of both wounds that at some point is going to need to be cleaned off to allow these to heal but right now obviously this is much too tender for him. No fevers, chills, nausea, vomiting, or diarrhea. 02/02/2019 on evaluation today patient presents for follow-up concerning the wounds over his bilateral lower extremities. He is continuing to manage. He was in the hospital and has been discharged at this point. He was admitted from 01/21/2019 through 01/27/2019. This was due to cellulitis of lower extremity. Nonetheless he does seem to be doing much better at this point. I did review his discharge summary as well he did not appear to have any positive findings on blood cultures. His white blood cell count was never really elevated either which is also good news. He was treated with IV clindamycin for 4 days and then switch to complete a 10-day course at home which she is still working on at this point. They did have him on IV Lasix in the hospital which helped him diurese well on discharge they stated that his weight was 408 pounds which is still higher than his dry weight of 375 to 380 pounds. Upon discharge his torsemide was increased from 40 to 60 mg twice daily and he is to follow-up with cardiology as well. 12/29-Patient returns at 2 weeks  for evaluation and follow-up of his bilateral lower extremity wounds, with lymphedema, venous  ulcerations, patient was recently hospitalized and was aggressively diuresed after which his torsemide has been increased. He follows up with cardiology as well. His left leg stasis wounds are more moist and have maceration of tissue, the right one is less so. We are using silver cell and unna compression given the degree of edema in the legs. Readmission: 05/04/2019 patient presents today for reevaluation here in the clinic I have not seen him since December 2020. Subsequently this is a fairly sick individual with multiple comorbidities who presents for follow-up concerning wounds of his bilateral lower extremities. He tells me that after he was in the hospital following when we saw him last in December that he ended up subsequently being discharged with home health services and they have been coming out and applying Unna boot wraps. With that being said he has used up all of his home health visits for the year and then for the last week has not had anything on his legs as far as the wrap is concerned. Obviously this is not good and he does come in today with increased swelling or at least what I perceived to be along with somewhat dry wounds again he does not seem to be weeping too much at this point which is at least good news but he has significant lymphedema/venous stasis. No fevers, chills, nausea, vomiting, or diarrhea. 06/01/2019 upon evaluation today patient is seen after not having been seen actually since 19 2021. He kept coming for appointment with every time he would come his temperature would be elevated past the point where we could see him. I told him that he needed to go be seen at the ER during 1 of these times due to the fact that obviously this was a fever of unknown origin at that point and my concern was that it could potentially be his legs but again I was not entirely sure as obviously I could not see them due to the restrictions secondary to Covid even bringing the patient into  the clinic. Nonetheless he finally came in today his temperature again was elevated but not to the same degree that it met the 100.4 or above guidelines of having to cancel the appointment. Either way we did have a look at his legs unfortunately he appears to show signs of cellulitis at this point which is probably where the temperature elevations are emanating from. He has significant lymphedema as well as chronic vent venous stasis as well. When I initially told him that is why I told him to go to the ER previously when his temperature was elevated in case it was something with wounds or otherwise he replied "nobody ever told me to go to the ER". I then subsequently reminded him of me coming to the front desk due to his temperature being elevated and advising him to go to the ER to which she replied "I am not going to the ER and I do not think I need to even with this". Nonetheless I am concerned about the infection of his legs at this point he has significant weeping, significant edema, and overall is not doing very well in my opinion. 4/23; the patient arrives in clinic today having napkin stuck on his wounds. He is taking the antibiotic from last week. Last Wednesday he had his INR checked at the Coumadin clinic it was apparently "sky high" and his Coumadin is now on hold.  He is going back to check this again on Monday. His legs were not put back in compression last week out of fear of the extending infection with fever. He has been taking the antibiotics. Culture from last week showed Enterobacter and methicillin resistant staph aureus. He is on on doxycycline which the MRSA was sensitive to Enterobacter was not plated but given the pattern of sensitivities probably the doxycycline was satisfactory. I will extend the doxycycline another 4 days until he is seen next week. From an infection point of view this looks better 06/14/2019 upon evaluation today patient unfortunately continues to have signs  of cellulitis on the lower extremities bilaterally especially on the left. Unfortunately he doesn't seem to be doing much better despite being on doxycycline for a couple of weeks. He was positive for MRSA as well as Enterobacter. Obviously the MRSA is treated by way of the doxycycline the Enterobacter unfortunately is not I'm going to see about initiating Bactrim for him at this point. 5/13; since he was last here he was hospitalized from 5/7 through 5/11. Mostly this was for congestive heart failure. He has an ejection fraction of 25 to 30%. His torsemide dose was increased. While he was there they dealt with the infection in the right leg. He did not take his Bactrim that he was prescribed last time. He was treated with linezolid in the hospital and cefepime and discharged on Bactrim to complete a 2-week course. He was supposed to follow-up with the Coumadin clinic at his cardiologist's office [Dr. End). He has not done this. He tells me he is on Coumadino 5 mg twice a day. The last INR I see is from 5/11 when he was discharged from the hospital it was 2.1 however it was increasing every day they check this. He was thought to have cellulitis from methicillin-resistant staph aureus. [name] [name], [name] [name] ([id number]) 5/21; patient's wound on the right lower leg actually looks a lot better although not much change in size. We used Iodoflex under an Haematologist last week. The [employer] stayed in place. 07/20/2019 upon evaluation today patient actually appears to be doing much better in regard to his legs in general. He does not have the infection that was previously noted which is great news he was in the hospital which he finally did go after things got significantly worse. Fortunately he seems to be doing much better in regard to his overall appearance today in that regard. With that being said he still is experiencing issues currently with wounds over the lower extremities and his wraps do tend to slip  down. We probably do need to use the Unna paste which I think we are doing here but I am not sure the home health nurses are using right now we may need to send this order along as well. 07/27/2019 upon evaluation today patient appears to be doing excellent in regard to his lower extremities all things considered. He still has significant edema noted but the compression wraps do seem to be helping and overall after getting his infection under good control I feel like things have improved dramatically. There does not appear to be any signs of active infection at this time. No fevers, chills, nausea, vomiting, or diarrhea. Readmission: 10/30/2019 patient presents today for readmission here in the clinic concerning issues that he has been having with his bilateral lower extremities. He tells me after last time he was in the hospital he actually had improvement of his legs to everything got  under much better control was actually doing quite well. Nonetheless right now he has blisters that reopened he tells me about a month ago and has not really been able to get them close. He does not have any compression stockings that he is wearing at this point he came in really been nothing on the wounds currently. 11/15/2019 on evaluation today patient appears to be doing well with regard to his leg ulcers. He has been tolerating the dressing changes without complication. Fortunately there is no evidence of active infection at this time. 11/22/2019 on evaluation today patient actually is making excellent improvement in regard to his legs at this point. I am actually very pleased with the overall way that he is progressing this go around there is no signs of infection and in general I think he is doing what he needs to to see these wounds heal. Electronic Signature(s) Signed: 11/22/2019 4:28:08 PM By: Worthy Keeler PA-C Entered By: Worthy Keeler on 11/22/2019 16:28:07 [name] [name], [name] [name]  ([id number]) -------------------------------------------------------------------------------- Physical Exam Details Patient Name: [name] [name] Date of Service: 11/22/2019 2:30 PM Medical Record Number: [id number] Patient Account Number: [id number] Date of Birth/Sex: 09-22-1960 (59 y.o. M) Treating RN: Grover Canavan Primary Care Provider: Nobie Putnam Other Clinician: Referring Provider: Nobie Putnam Treating Provider/Extender: Melburn Hake, Liliane Mallis Weeks in Treatment: 28 Constitutional Obese and well-hydrated in no acute distress. Respiratory normal breathing without difficulty. Psychiatric this patient is able to make decisions and demonstrates good insight into disease process. Alert and Oriented x 3. pleasant and cooperative. Notes Unfortunately were unable to get home health to come out therefore we can have to see him here for repeat wrap changes weekly. Apparently home health feels like that that his home situation at this point is not appropriate or safe for nurses to come out to see him. Nonetheless that was the only option we had for trying to cut back on the number of times he had to come into the clinic so we will just have to see him here which we discussed with him today as well. The wounds themselves actually appear to be doing quite well hopefully he will not take much longer to completely heal. Electronic Signature(s) Signed: 11/22/2019 4:28:51 PM By: Worthy Keeler PA-C Entered By: Worthy Keeler on 11/22/2019 16:28:50 [name] [name] ([id number]) -------------------------------------------------------------------------------- Physician Orders Details Patient Name: [name] [name] Date of Service: 11/22/2019 2:30 PM Medical Record Number: [id number] Patient Account Number: [id number] Date of Birth/Sex: 04-30-1960 (59 y.o. M) Treating RN: Cornell Barman Primary Care Provider: Nobie Putnam Other Clinician: Referring Provider: Nobie Putnam Treating Provider/Extender: Melburn Hake, Donneisha Beane Weeks in Treatment: 57 Verbal / Phone Orders: No Diagnosis Coding ICD-10 Coding Code Description I87.2 Venous insufficiency (chronic) (peripheral) I89.0 Lymphedema, not elsewhere classified L97.812 Non-pressure chronic ulcer of other part of right lower leg with fat layer exposed L97.822 Non-pressure chronic ulcer of other part of left lower leg with fat layer exposed [name] [name] (primary) hypertension J44.9 Chronic obstructive pulmonary disease, unspecified Z79.01 Long term (current) use of anticoagulants I48.0 Paroxysmal atrial fibrillation Z86.711 Personal history of pulmonary embolism 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 o Dial antibacterial soap, wash wounds, rinse and pat dry prior to dressing wounds Wound #5 Left,Lateral Lower Leg o Clean wound with Normal Saline. o Dial antibacterial soap, wash wounds, rinse and pat dry prior to dressing wounds  Primary Wound Dressing Wound #10 Right,Distal,Medial Lower Leg o Silver Alginate Wound #5 Left,Lateral Lower Leg o Silver Alginate Secondary Dressing Wound #10 Right,Distal,Medial Lower Leg o XtraSorb Wound #5 Left,Lateral Lower Leg o XtraSorb Dressing Change Frequency Wound #10 Right,Distal,Medial Lower Leg o Dressing is to be changed Monday and Thursday. Wound #5 Left,Lateral Lower Leg o Dressing is to be changed Monday and Thursday. Follow-up Appointments Wound #10 Right,Distal,Medial Lower Leg o Return Appointment in 2 weeks. Wound #5 Left,Lateral Lower Leg o Return Appointment in 2 weeks. o Return Appointment in 2 weeks. [name] [name], [name] [name] ([id number]) Edema Control Wound #10 Right,Distal,Medial Lower Leg o 4 Layer Compression System -  Bilateral o Elevate legs to the level of the heart and pump ankles as often as possible Wound #5 Left,Lateral Lower Leg o 4 Layer Compression System - Bilateral o Elevate legs to the level of the heart and pump ankles as often as possible Home Health Wound#10 [place] for [place] to change dressings on Mon and Thurs unless pt has appointment at the [facility]. o Home Health Nurse may visit PRN to address patientos wound care needs. o If current dressing causes regression in wound condition, may D/C ordered dressing product/s and apply Normal Saline Moist Dressing daily until next [place] / Other MD appointment. [place] of regression in wound condition at [phone]. o Please direct any NON-WOUND related issues/requests for orders to patient's Primary Care Physician Wound#5 [place] for [place] to change dressings on Mon and Thurs unless pt has appointment at the [facility]. o Home Health Nurse may visit PRN to address patientos wound care needs. o If current dressing causes regression in wound condition, may D/C ordered dressing product/s and apply Normal Saline Moist Dressing daily until next [place] / Other MD appointment. [place] of regression in wound condition at [phone]. o Please direct any NON-WOUND related issues/requests for orders to patient's Primary Care Physician Electronic Signature(s) Signed: 11/22/2019 5:05:57 PM By: Gretta Cool, BSN, RN, CWS, Kim RN, BSN Signed: 11/22/2019 5:23:32 PM By: Worthy Keeler PA-C Entered By: Gretta Cool BSN, RN, CWS, Kim on 11/22/2019 15:23:35 LENOX, BINK ([id number]) -------------------------------------------------------------------------------- Problem List Details Patient Name: [name] [name] Date of Service: 11/22/2019 2:30 PM Medical Record Number:  [id number] Patient Account Number: [id number] Date of Birth/Sex: July 31, 1960 (59 y.o. M) Treating RN: Grover Canavan Primary Care Provider: Nobie Putnam Other Clinician: Referring Provider: Nobie Putnam Treating Provider/Extender: Melburn Hake, Madalynn Pickelsimer Weeks in Treatment: 28 Active Problems ICD-10 Encounter Code Description Active Date MDM Diagnosis I87.2 Venous insufficiency (chronic) (peripheral) 05/04/2019 No Yes I89.0 Lymphedema, not elsewhere classified 05/04/2019 No Yes L97.812 Non-pressure chronic ulcer of other part of right lower leg with fat layer 05/04/2019 No Yes exposed L97.822 Non-pressure chronic ulcer of other part of left lower leg with fat layer 05/04/2019 No Yes exposed [place] (primary) hypertension 05/04/2019 No Yes J44.9 Chronic obstructive pulmonary disease, unspecified 05/04/2019 No Yes Z79.01 Long term (current) use of anticoagulants 05/04/2019 No Yes I48.0 Paroxysmal atrial fibrillation 05/04/2019 No Yes Z86.711 Personal history of pulmonary embolism 05/04/2019 No Yes I50.42 Chronic combined systolic (congestive) and diastolic (congestive) heart 05/04/2019 No Yes failure E11.622 Type 2 diabetes mellitus with other skin ulcer 10/30/2019 No Yes Inactive Problems Resolved Problems JERIEL, VIVANCO ([id number]) Electronic Signature(s) Signed: 11/22/2019 2:13:29 PM By: Worthy Keeler PA-C Entered By: Worthy Keeler on 11/22/2019 14:13:29 [name] [name] ([id number]) -------------------------------------------------------------------------------- Progress  Note Details Patient Name: KELAN, PRITT Date of Service: 11/22/2019 2:30 PM Medical Record Number: [id number] Patient Account Number: [id number] Date of Birth/Sex: 1960/05/19 (59 y.o. M) Treating RN: Grover Canavan Primary Care Provider: Nobie Putnam Other Clinician: Referring Provider: Nobie Putnam Treating Provider/Extender: Melburn Hake, Lilliahna Schubring Weeks in Treatment:  28 Subjective Chief Complaint Information obtained from Patient Bilateral LE ulcers History of Present Illness (HPI) 01/18/2019 on evaluation today patient presents for initial evaluation here in our clinic concerning issues he is been having with his bilateral lower extremities. He appears based on what I am seeing to have bilateral lower extremity cellulitis along with chronic venous stasis. He has significant pitting edema even in the bottom of his foot. With that being said he also has a history of hypertension, COPD, congestive heart failure, long-term use of anticoagulants due to atrial fibrillation which is paroxysmal as well as having had a pulmonary embolism in the past. With that being said he tells me that he has constant drainage out of his left lower extremity. This is likely due to the fact that again he has significant swelling pretty much all the time. With that being said in the past 2 weeks this has been getting worse and he tells me at this point that he feels like it is infected is also hurting a lot more than it has been in the past and the drainage is a different color. The left side is definitely worse than the right. Fortunately there is no signs of active infection systemically although I am concerned due to the severity of the infection that I see in the left lower extremity especially. There is a lot of necrotic tissue on the surface of both wounds that at some point is going to need to be cleaned off to allow these to heal but right now obviously this is much too tender for him. No fevers, chills, nausea, vomiting, or diarrhea. 02/02/2019 on evaluation today patient presents for follow-up concerning the wounds over his bilateral lower extremities. He is continuing to manage. He was in the hospital and has been discharged at this point. He was admitted from 01/21/2019 through 01/27/2019. This was due to cellulitis of lower extremity. Nonetheless he does seem to be doing  much better at this point. I did review his discharge summary as well he did not appear to have any positive findings on blood cultures. His white blood cell count was never really elevated either which is also good news. He was treated with IV clindamycin for 4 days and then switch to complete a 10-day course at home which she is still working on at this point. They did have him on IV Lasix in the hospital which helped him diurese well on discharge they stated that his weight was 408 pounds which is still higher than his dry weight of 375 to 380 pounds. Upon discharge his torsemide was increased from 40 to 60 mg twice daily and he is to follow-up with cardiology as well. 12/29-Patient returns at 2 weeks for evaluation and follow-up of his bilateral lower extremity wounds, with lymphedema, venous ulcerations, patient was recently hospitalized and was aggressively diuresed after which his torsemide has been increased. He follows up with cardiology as well. His left leg stasis wounds are more moist and have maceration of tissue, the right one is less so. We are using silver cell and unna compression given the degree of edema in the legs. Readmission: 05/04/2019 patient presents today for reevaluation here in the clinic  I have not seen him since December 2020. Subsequently this is a fairly sick individual with multiple comorbidities who presents for follow-up concerning wounds of his bilateral lower extremities. He tells me that after he was in the hospital following when we saw him last in December that he ended up subsequently being discharged with home health services and they have been coming out and applying Unna boot wraps. With that being said he has used up all of his home health visits for the year and then for the last week has not had anything on his legs as far as the wrap is concerned. Obviously this is not good and he does come in today with increased swelling or at least what I perceived  to be along with somewhat dry wounds again he does not seem to be weeping too much at this point which is at least good news but he has significant lymphedema/venous stasis. No fevers, chills, nausea, vomiting, or diarrhea. 06/01/2019 upon evaluation today patient is seen after not having been seen actually since 19 2021. He kept coming for appointment with every time he would come his temperature would be elevated past the point where we could see him. I told him that he needed to go be seen at the ER during 1 of these times due to the fact that obviously this was a fever of unknown origin at that point and my concern was that it could potentially be his legs but again I was not entirely sure as obviously I could not see them due to the restrictions secondary to Covid even bringing the patient into the clinic. Nonetheless he finally came in today his temperature again was elevated but not to the same degree that it met the 100.4 or above guidelines of having to cancel the appointment. Either way we did have a look at his legs unfortunately he appears to show signs of cellulitis at this point which is probably where the temperature elevations are emanating from. He has significant lymphedema as well as chronic vent venous stasis as well. When I initially told him that is why I told him to go to the ER previously when his temperature was elevated in case it was something with wounds or otherwise he replied "nobody ever told me to go to the ER". I then subsequently reminded him of me coming to the front desk due to his temperature being elevated and advising him to go to the ER to which she replied "I am not going to the ER and I do not think I need to even with this". Nonetheless I am concerned about the infection of his legs at this point he has significant weeping, significant edema, and overall is not doing very well in my opinion. 4/23; the patient arrives in clinic today having napkin stuck on his  wounds. He is taking the antibiotic from last week. Last Wednesday he had his INR checked at the Coumadin clinic it was apparently "sky high" and his Coumadin is now on hold. He is going back to check this again on Monday. His legs were not put back in compression last week out of fear of the extending infection with fever. He has been taking the antibiotics. Culture from last week showed Enterobacter and methicillin resistant staph aureus. He is on on doxycycline which the MRSA was sensitive to Enterobacter was not plated but given the pattern of sensitivities probably the doxycycline was satisfactory. I will extend the doxycycline another 4 days until he  is seen next week. From an infection point of view this looks better 06/14/2019 upon evaluation today patient unfortunately continues to have signs of cellulitis on the lower extremities bilaterally especially on the left. Unfortunately he doesn't seem to be doing much better despite being on doxycycline for a couple of weeks. He was positive for MRSA as well as Enterobacter. Obviously the MRSA is treated by way of the doxycycline the Enterobacter unfortunately is not I'm going to see about initiating Bactrim for him at this point. 5/13; since he was last here he was hospitalized from 5/7 through 5/11. Mostly this was for congestive heart failure. He has an ejection fraction of Cunanan, Bradly ([id number]) 25 to 30%. His torsemide dose was increased. While he was there they dealt with the infection in the right leg. He did not take his Bactrim that he was prescribed last time. He was treated with linezolid in the hospital and cefepime and discharged on Bactrim to complete a 2-week course. He was supposed to follow-up with the Coumadin clinic at his cardiologist's office [Dr. End). He has not done this. He tells me he is on Coumadino 5 mg twice a day. The last INR I see is from 5/11 when he was discharged from the hospital it was 2.1 however it was  increasing every day they check this. He was thought to have cellulitis from methicillin-resistant staph aureus. 5/21; patient's wound on the right lower leg actually looks a lot better although not much change in size. We used Iodoflex under an Haematologist last week. The [employer] stayed in place. 07/20/2019 upon evaluation today patient actually appears to be doing much better in regard to his legs in general. He does not have the infection that was previously noted which is great news he was in the hospital which he finally did go after things got significantly worse. Fortunately he seems to be doing much better in regard to his overall appearance today in that regard. With that being said he still is experiencing issues currently with wounds over the lower extremities and his wraps do tend to slip down. We probably do need to use the Unna paste which I think we are doing here but I am not sure the home health nurses are using right now we may need to send this order along as well. 07/27/2019 upon evaluation today patient appears to be doing excellent in regard to his lower extremities all things considered. He still has significant edema noted but the compression wraps do seem to be helping and overall after getting his infection under good control I feel like things have improved dramatically. There does not appear to be any signs of active infection at this time. No fevers, chills, nausea, vomiting, or diarrhea. Readmission: 10/30/2019 patient presents today for readmission here in the clinic concerning issues that he has been having with his bilateral lower extremities. He tells me after last time he was in the hospital he actually had improvement of his legs to everything got under much better control was actually doing quite well. Nonetheless right now he has blisters that reopened he tells me about a month ago and has not really been able to get them close. He does not have any compression  stockings that he is wearing at this point he came in really been nothing on the wounds currently. 11/15/2019 on evaluation today patient appears to be doing well with regard to his leg ulcers. He has been tolerating the dressing changes without  complication. Fortunately there is no evidence of active infection at this time. 11/22/2019 on evaluation today patient actually is making excellent improvement in regard to his legs at this point. I am actually very pleased with the overall way that he is progressing this go around there is no signs of infection and in general I think he is doing what he needs to to see these wounds heal. Objective Constitutional Obese and well-hydrated in no acute distress. Vitals Time Taken: 2:35 AM, Height: 68 in, Weight: 400 lbs, BMI: 60.8, Temperature: 98.7 F, Pulse: 64 bpm, Respiratory Rate: 24 breaths/min, Blood Pressure: 157/90 mmHg. Respiratory normal breathing without difficulty. Psychiatric this patient is able to make decisions and demonstrates good insight into disease process. Alert and Oriented x 3. pleasant and cooperative. General Notes: Unfortunately were unable to get home health to come out therefore we can have to see him here for repeat wrap changes weekly. Apparently home health feels like that that his home situation at this point is not appropriate or safe for nurses to come out to see him. Nonetheless that was the only option we had for trying to cut back on the number of times he had to come into the clinic so we will just have to see him here which we discussed with him today as well. The wounds themselves actually appear to be doing quite well hopefully he will not take much longer to completely heal. Integumentary (Hair, Skin) Wound #10 status is Open. Original cause of wound was Gradually Appeared. The wound is located on the Right,Distal,Medial Lower Leg. The wound measures 1.5cm length x 1cm width x 0.1cm depth; 1.178cm^2 area and  0.118cm^3 volume. There is Fat Layer (Subcutaneous Tissue) exposed. There is no granulation within the wound bed. There is a large (67-100%) amount of necrotic tissue within the wound bed including Adherent Slough. Wound #5 status is Open. Original cause of wound was Gradually Appeared. The wound is located on the Left,Lateral Lower Leg. The wound measures 4cm length x 6.5cm width x 0.1cm depth; 20.42cm^2 area and 2.042cm^3 volume. There is Fat Layer (Subcutaneous Tissue) exposed. There is a large amount of serous drainage noted. The wound margin is flat and intact. There is medium (34-66%) pink granulation within the wound bed. There is a medium (34-66%) amount of necrotic tissue within the wound bed including Adherent Slough. Wound #9 status is Healed - Epithelialized. Original cause of wound was Gradually Appeared. The wound is located on the Right,Lateral Lower Leg. The wound measures 0cm length x 0cm width x 0cm depth; 0cm^2 area and 0cm^3 volume. There is a large amount of serous drainage noted. The wound margin is flat and intact. There is large (67-100%) red granulation within the wound bed. There is no necrotic tissue within the [place], [name] [name] ([id number]) wound bed. 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 Dial antibacterial soap,  wash wounds, rinse and pat dry prior to dressing wounds Wound #5 Left,Lateral Lower Leg: Clean wound with Normal Saline. Dial antibacterial soap,  wash wounds, rinse and pat dry prior to dressing wounds Primary Wound Dressing: Wound #10 Right,Distal,Medial Lower Leg: Silver Alginate Wound #5 Left,Lateral Lower Leg: Silver Alginate Secondary Dressing: Wound #10 Right,Distal,Medial Lower Leg: XtraSorb Wound #5 Left,Lateral Lower Leg: XtraSorb Dressing Change Frequency: Wound #10 Right,Distal,Medial Lower Leg: Dressing is to be changed Monday and Thursday. Wound #5 Left,Lateral Lower Leg: Dressing is to be changed Monday and Thursday. Follow-up Appointments: Wound #10 Right,Distal,Medial Lower Leg: Return Appointment in 2 weeks. Wound #5 Left,Lateral Lower Leg: Return Appointment in 2 weeks. Return Appointment in 2 weeks. Edema Control: Wound #10 Right,Distal,Medial Lower Leg: 4 Layer Compression System - Bilateral Elevate legs to the level of the heart and pump ankles as often as possible Wound #5 Left,Lateral Lower Leg: 4 Layer Compression System - Bilateral Elevate legs to the level of the heart and pump ankles as often as possible Home Health: Wound #10 Right,Distal,Medial Lower Leg: [facility] to change dressings on Mon and Thurs unless pt has appointment at the [facility]. Home Health Nurse may visit PRN to address patient s wound care needs. If current dressing causes regression in wound condition, may D/C ordered dressing product/s and apply Normal Saline Moist Dressing daily until next [place] / Other MD appointment. [place] of regression in wound condition at [phone]. Please direct any NON-WOUND related issues/requests for orders to patient's Primary Care Physician Wound #5 Left,Lateral Lower Leg: [facility] to change dressings on Mon and Thurs unless pt has appointment  at the [facility]. Home Health Nurse may visit PRN to address patient s wound care needs. If current dressing causes regression in wound condition, may D/C ordered dressing product/s and apply Normal Saline Moist Dressing daily until next [place] / Other MD appointment. [place] of regression in wound condition at [phone]. Please direct any NON-WOUND related issues/requests for orders to patient's Primary Care Physician RUDELL, ORTMAN ([id number]) 1. I would recommend currently that we continue with the wound care measures as before and the patient is in agreement with that plan. There does not appear to be any signs of active infection which is great news. This includes utilization of the silver alginate dressing followed by Lauraine Rinne. 2. We will get a continue with the 4-layer compression wrap as well as that seems to be doing well for him. We will see patient back for reevaluation in 1 week here in the clinic. If anything worsens or changes patient will contact our office for additional recommendations. Electronic Signature(s) Signed: 11/22/2019 4:29:21 PM By: Worthy Keeler PA-C Entered By: Worthy Keeler on 11/22/2019 16:29:21 SAL, SPRATLEY ([id number]) -------------------------------------------------------------------------------- SuperBill Details Patient Name: [name] [name] Date of Service: 11/22/2019 Medical Record Number: [id number] Patient Account Number: [id number] Date of Birth/Sex: 1960/03/15 (59 y.o. M) Treating RN: Cornell Barman Primary Care Provider: Nobie Putnam Other Clinician: Referring Provider: Nobie Putnam Treating Provider/Extender: Melburn Hake, Maurion [place] Weeks in Treatment: 28 Diagnosis Coding ICD-10 Codes Code Description I87.2 Venous insufficiency (chronic) (peripheral) I89.0 Lymphedema, not elsewhere classified L97.812 Non-pressure chronic ulcer of other part of right lower leg with fat layer exposed L97.822  Non-pressure chronic ulcer of other part of left lower leg with fat layer exposed I10 Essential (primary) hypertension J44.9 Chronic obstructive pulmonary disease, unspecified Z79.01 Long term (current) use of anticoagulants I48.0 Paroxysmal atrial fibrillation Z86.711 Personal history of pulmonary embolism I50.42 Chronic combined systolic (congestive) and diastolic (congestive) heart failure E11.622 Type  2 diabetes mellitus with other skin ulcer Facility Procedures CPT4: Description Modifier Quantity Code [id number] BILATERAL: Application of multi-layer venous compression system; leg (below knee), including 1 ankle and foot. Physician Procedures CPT4 Code: [id number] Description: 28366 - WC PHYS LEVEL 3 - EST PT Modifier: Quantity: 1 CPT4 Code: Description: ICD-10 Diagnosis Description I87.2 Venous insufficiency (chronic) (peripheral) I89.0 Lymphedema, not elsewhere classified Q94.765 Non-pressure chronic ulcer of other part of right lower leg with fat la L97.822 Non-pressure chronic ulcer of  other part of left lower leg with fat lay Modifier: yer exposed er exposed Quantity: Electronic Signature(s) Signed: 11/22/2019 4:29:58 PM By: Worthy Keeler PA-C Entered By: Worthy Keeler on 11/22/2019 16:29:57

## 2019-11-26 ENCOUNTER — Ambulatory Visit: Payer: Self-pay | Admitting: General Practice

## 2019-11-26 ENCOUNTER — Telehealth: Payer: Self-pay

## 2019-11-26 ENCOUNTER — Telehealth: Payer: Self-pay | Admitting: General Practice

## 2019-11-26 ENCOUNTER — Telehealth: Payer: Medicaid Other | Admitting: General Practice

## 2019-11-26 DIAGNOSIS — W19XXXD Unspecified fall, subsequent encounter: Secondary | ICD-10-CM

## 2019-11-26 DIAGNOSIS — E782 Mixed hyperlipidemia: Secondary | ICD-10-CM

## 2019-11-26 DIAGNOSIS — F411 Generalized anxiety disorder: Secondary | ICD-10-CM

## 2019-11-26 DIAGNOSIS — Z9119 Patient's noncompliance with other medical treatment and regimen: Secondary | ICD-10-CM

## 2019-11-26 DIAGNOSIS — F3341 Major depressive disorder, recurrent, in partial remission: Secondary | ICD-10-CM

## 2019-11-26 DIAGNOSIS — I10 Essential (primary) hypertension: Secondary | ICD-10-CM

## 2019-11-26 DIAGNOSIS — Z91199 Patient's noncompliance with other medical treatment and regimen due to unspecified reason: Secondary | ICD-10-CM

## 2019-11-26 DIAGNOSIS — I5032 Chronic diastolic (congestive) heart failure: Secondary | ICD-10-CM

## 2019-11-26 DIAGNOSIS — I4819 Other persistent atrial fibrillation: Secondary | ICD-10-CM

## 2019-11-26 DIAGNOSIS — J432 Centrilobular emphysema: Secondary | ICD-10-CM

## 2019-11-26 NOTE — Telephone Encounter (Signed)
  Chronic Care Management   Note  11/26/2019 Name: [name] [name] MRN: [id number] DOB: 1960/07/09  The patient called back.  See new encounter for details.   Follow up plan: Telephone follow up appointment with care management team member scheduled for: 01-14-2020 at 10:15 am  Alto [place] RN, MSN, CCM Community Care Coordinator [facility]  [facility] [place] Mobile: [phone]

## 2019-11-26 NOTE — Chronic Care Management (AMB) (Signed)
Care Management   Follow Up Note   11/26/2019 Name: [name] [name] MRN: [id number] DOB: [birth year]  Referred by: [name], DO Reason for referral : Care Coordination (RNCM Follow for Chronic Disease Management and Care Coordination Needs)   Nijee Heatwole is a 59 y.o. year old male who is a primary care patient of [name], DO. The care management team was consulted for assistance with care management and care coordination needs.    Review of patient status, including review of consultants reports, relevant laboratory and other test results, and collaboration with appropriate care team members and the patient's provider was performed as part of comprehensive patient evaluation and provision of chronic care management services.    SDOH (Social Determinants of Health) assessments performed: Yes See Care Plan activities for detailed interventions related to [facility])     Advanced Directives: See Care Plan and Vynca application for related entries.   Goals Addressed              This Visit's Progress   .  RNCM: Pt- "I did not run out of oxygen, they got that wrong" (pt-stated)        CARE PLAN ENTRY (see longtitudinal plan of care for additional care plan information)  Current Barriers:  . Chronic Disease Management support, education, and care coordination needs related to Atrial Fibrillation, HTN, HLD, COPD, Anxiety, and Depression  Clinical Goal(s) related to Atrial Fibrillation, HTN, HLD, COPD, Anxiety, and Depression:  Over the next 120 days, patient will:  . Work with the care management team to address educational, disease management, and care coordination needs  . Begin or continue self health monitoring activities as directed today Measure and record blood pressure 2/3 times per week and adhere to a heart healthy/ADA diet- diabetes under control . Call provider office for new or worsened signs and symptoms Blood pressure findings outside  established parameters, Weight outside established parameters, Oxygen saturation lower than established parameter, Chest pain, Shortness of breath, and New or worsened symptom related to Afib/Htn/HLD/Depression/Anxiety/COPD and other chronic conditions  . Call care management team with questions or concerns . Verbalize basic understanding of patient centered plan of care established today  Interventions related to Atrial Fibrillation, HTN, HLD, COPD, Anxiety, and Depression:  . Evaluation of current treatment plans and patient's adherence to plan as established by provider.  The patient states he does not have the discharge instructions from his recent hospitalization. Ask if the patient understood his instructions he said no. Ask the patient where his discharge instructions were and he states he threw them away. Extensive education on the purpose of the CCM team and how when he does not understand his conditions or treatment that we are here to help him. He said he was not aware that he should review them with anyone. The patient states he is doing fine post hospitalization except he has a "chest cold".  He says he is not coughing up much but when he has it has been green.  He also states that he is not running a fever.  Has an appointment to see the provider on 11-30-2019.  Instructed the patient to go to urgent care or ER if sx/sx became worse. The patient verbalized understanding.  . Assessed patient understanding of disease states.  The patient states he understands about his health. History presents that he does not fully follow recommendations of his medical providers.  The CCM team is currently working with the patient to help in managing his  health and well being.  . Assessed patient's education and care coordination needs.  The patient verbalized he still has not gotten his new card.  It is on his list today to call them to get his new card and also to arrange transportation to his provider  appointment on 11-30-2019.  Denies any other needs at this time. Reminded the patient of the pharm D scheduled call this afternoon to review medications and ask for recommendations for stated "chest cold".  . Provided disease specific education to patient- education on worsening sx/sx of condition and to seek help if needed.  Steele Sizer with appropriate clinical care team members regarding patient needs.  The patient is currently working with the CCM team in managing his health and well being.   Patient Self Care Activities related to HTN, HLD, COPD, Anxiety, and Depression:  . Patient is unable to independently self-manage chronic health conditions  Initial goal documentation     .  RNCM: Pt-"I don't feel safe enough to weigh" (pt-stated)        CARE PLAN ENTRY (see longitudinal plan of care for additional care plan information)  Current Barriers:  [name] Knowledge deficit related to basic heart failure pathophysiology and self care management . Lack of scale in home- has a scale but does not feel safe enough to weigh due to balance issues . Financial strain . Lack of caregiver support . History of non-compliance . Transportation barriers  Nurse Case Manager Clinical Goal(s):   Over the next 120 days, patient will weigh self daily and record- when safe to do so  Over the next 120 days, patient will verbalize understanding of Heart Failure Action Plan and when to call doctor  Over the next 120 days, patient will take all Heart Failure mediations as prescribed  Interventions:  . Basic overview and discussion of pathophysiology of Heart Failure . Provided written and verbal education on low sodium diet. Review of heart healthy/ADA diet. The patient endorses compliance. Had to cut the call short, will explore further at next outreach.  11-26-2019: The patient was in the hospital recently for COPD exacerbation and likely DHF.  He received IV lasix.  The notes state he ran out of oxygen but  the patient denies this. The patient says he does not use his oxygen all the time but is having to use it now because he has a "chest cold".  Education on keeping appointment this week and going to urgent care or ER for worsening sx/sx of condition.   . Reviewed Heart Failure Action Plan in depth and provided written copy . Assessed for scales in home- the patient does not feel safe to weigh at home . Discussed importance of daily weight.  The patient has a scale at home but does not feel safe to weigh on the scale. The patients weight in the office at first visit with pcp was 410 pounds. Weight at last hospitalization was 417 . Reviewed role of diuretics in prevention of fluid overload . Review of fluid/water weight versus body weight. The patient states he does have fluctuations in his weights but does not know how much because he is concerned about safety.  . Evaluation of upcoming appointments. The patient has an appointment to see the cardiologist tomorrow at [facility] pending adequate transportation. The patient also has follow up with pcp post hospitalization on 11-30-2019. The patient has a follow up with the pharm D via phone this afternoon. Reminded the patient of the pharm D  appointment.   Patient Self Care Activities:  . Take Heart Failure Medications as prescribed . Weigh daily and record (notify MD with 3 lb weight gain over night or 5 lb in a week)- when safe to do so . Follow CHF Action Plan . Adhere to low sodium diet  Please see past updates related to this goal by clicking on the "Past Updates" button in the selected goal      .  RNCM: Pt-"I had a fall three nights ago" (pt-stated)        CARE PLAN ENTRY (see longitudinal plan of care for additional care plan information)  Current Barriers:  [name] Knowledge Deficits related to fall precautions in patient with  . Decreased adherence to prescribed treatment for fall prevention . Lacks caregiver support.  . Transportation  barriers . Non-adherence to scheduled provider appointments . Non-adherence to prescribed medication regimen  Clinical Goal(s):  [name] Over the next 120 days, patient will demonstrate improved adherence to prescribed treatment plan for decreasing falls as evidenced by patient reporting and review of EMR . Over the next 120 days, patient will verbalize using fall risk reduction strategies discussed . Over the next 120 days, patient will not experience additional falls . Over the next 120 days, patient will verbalize understanding of plan for reducing fall events and remaining safe in his home environment.   Interventions:  . Provided written and verbal education re: Potential causes of falls and Fall prevention strategies . Reviewed medications and discussed potential side effects of medications such as dizziness and frequent urination . Assessed for s/s of orthostatic hypotension . Assessed for falls since last encounter. Patient had a fall "three nights ago" Friday night on 10-12-2019. Denies any head injuries. Larey Seat out of his "office chair", was on the floor for a while and made it on the floor to his bed and was able to pull himself up off of the floor. Denies any injuries. 11-26-2019: Denies any new falls . Assessed patients knowledge of fall risk prevention secondary to previously provided education. . Assessed working status of life alert bracelet and patient adherence- unable to assess this call. Will assess on next outreach. The patient had to take an incoming call concerning transportation.  . Provided patient information for fall alert systems . Reviewed scheduled/upcoming provider appointments including: appointment follow up hospital discharge with pcp on 11-30-2019  Patient Self Care Activities:  . Utilize cane/walker (assistive device) appropriately with all ambulation . De-clutter walkways . Change positions slowly . Wear secure fitting shoes at all times with ambulation . Utilize  home lighting for dim lit areas . Have self and pet awareness at all times  Plan: . CCM RN CM will follow up in 30 - 60 days   Please see past updates related to this goal by clicking on the "Past Updates" button in the selected goal          Telephone follow up appointment with care management team member scheduled for: 01-14-2020 at 10:15 am  Alto Denver RN, MSN, CCM Community Care Coordinator [facility]  [facility] [place] Mobile: [phone]

## 2019-11-26 NOTE — Patient Instructions (Signed)
Visit Information  Goals Addressed              This Visit's Progress     RNCM: Pt- "I did not run out of oxygen, they got that wrong" (pt-stated)        CARE PLAN ENTRY (see longtitudinal plan of care for additional care plan information)  Current Barriers:   Chronic Disease Management support, education, and care coordination needs related to Atrial Fibrillation, HTN, HLD, COPD, Anxiety, and Depression  Clinical Goal(s) related to Atrial Fibrillation, HTN, HLD, COPD, Anxiety, and Depression:  Over the next 120 days, patient will:   Work with the care management team to address educational, disease management, and care coordination needs   Begin or continue self health monitoring activities as directed today Measure and record blood pressure 2/3 times per week and adhere to a heart healthy/ADA diet- diabetes under control  Call provider office for new or worsened signs and symptoms Blood pressure findings outside established parameters, Weight outside established parameters, Oxygen saturation lower than established parameter, Chest pain, Shortness of breath, and New or worsened symptom related to Afib/Htn/HLD/Depression/Anxiety/COPD and other chronic conditions   Call care management team with questions or concerns  Verbalize basic understanding of patient centered plan of care established today  Interventions related to Atrial Fibrillation, HTN, HLD, COPD, Anxiety, and Depression:   Evaluation of current treatment plans and patient's adherence to plan as established by provider.  The patient states he does not have the discharge instructions from his recent hospitalization. Ask if the patient understood his instructions he said no. Ask the patient where his discharge instructions were and he states he threw them away. Extensive education on the purpose of the CCM team and how when he does not understand his conditions or treatment that we are here to help him. He said he was not  aware that he should review them with anyone. The patient states he is doing fine post hospitalization except he has a "chest cold".  He says he is not coughing up much but when he has it has been green.  He also states that he is not running a fever.  Has an appointment to see the provider on 11-30-2019.  Instructed the patient to go to urgent care or ER if sx/sx became worse. The patient verbalized understanding.   Assessed patient understanding of disease states.  The patient states he understands about his health. History presents that he does not fully follow recommendations of his medical providers.  The CCM team is currently working with the patient to help in managing his health and well being.   Assessed patient's education and care coordination needs.  The patient verbalized he still has not gotten his new card.  It is on his list today to call them to get his new card and also to arrange transportation to his provider appointment on 11-30-2019.  Denies any other needs at this time. Reminded the patient of the pharm D scheduled call this afternoon to review medications and ask for recommendations for stated "chest cold".   Provided disease specific education to patient- education on worsening sx/sx of condition and to seek help if needed.   Collaborated with appropriate clinical care team members regarding patient needs.  The patient is currently working with the CCM team in managing his health and well being.   Patient Self Care Activities related to HTN, HLD, COPD, Anxiety, and Depression:   Patient is unable to independently self-manage chronic health conditions  Initial goal documentation       RNCM: Pt-"I don't feel safe enough to weigh" (pt-stated)        CARE PLAN ENTRY (see longitudinal plan of care for additional care plan information)  Current Barriers:   Knowledge deficit related to basic heart failure pathophysiology and self care management  Lack of scale in home- has  a scale but does not feel safe enough to weigh due to balance issues  Financial strain  Lack of caregiver support  History of non-compliance  Transportation barriers  Nurse Case Manager Clinical Goal(s):   Over the next 120 days, patient will weigh self daily and record- when safe to do so  Over the next 120 days, patient will verbalize understanding of Heart Failure Action Plan and when to call doctor  Over the next 120 days, patient will take all Heart Failure mediations as prescribed  Interventions:   Basic overview and discussion of pathophysiology of Heart Failure  Provided written and verbal education on low sodium diet. Review of heart healthy/ADA diet. The patient endorses compliance. Had to cut the call short, will explore further at next outreach.  11-26-2019: The patient was in the hospital recently for COPD exacerbation and likely DHF.  He received IV lasix.  The notes state he ran out of oxygen but the patient denies this. The patient says he does not use his oxygen all the time but is having to use it now because he has a "chest cold".  Education on keeping appointment this week and going to urgent care or ER for worsening sx/sx of condition.    Reviewed Heart Failure Action Plan in depth and provided written copy  Assessed for scales in home- the patient does not feel safe to weigh at home  Discussed importance of daily weight.  The patient has a scale at home but does not feel safe to weigh on the scale. The patients weight in the office at first visit with pcp was 410 pounds. Weight at last hospitalization was 417  Reviewed role of diuretics in prevention of fluid overload  Review of fluid/water weight versus body weight. The patient states he does have fluctuations in his weights but does not know how much because he is concerned about safety.   Evaluation of upcoming appointments. The patient has an appointment to see the cardiologist tomorrow at [facility]  pending adequate transportation. The patient also has follow up with pcp post hospitalization on 11-30-2019. The patient has a follow up with the pharm D via phone this afternoon. Reminded the patient of the pharm D appointment.   Patient Self Care Activities:   Take Heart Failure Medications as prescribed  Weigh daily and record (notify MD with 3 lb weight gain over night or 5 lb in a week)- when safe to do so  Follow CHF Action Plan  Adhere to low sodium diet  Please see past updates related to this goal by clicking on the "Past Updates" button in the selected goal        RNCM: Pt-"I had a fall three nights ago" (pt-stated)        CARE PLAN ENTRY (see longitudinal plan of care for additional care plan information)  Current Barriers:   Knowledge Deficits related to fall precautions in patient with   Decreased adherence to prescribed treatment for fall prevention  Lacks caregiver support.   Transportation barriers  Non-adherence to scheduled provider appointments  Non-adherence to prescribed medication regimen  Clinical Goal(s):  Over the next 120 days, patient will demonstrate improved adherence to prescribed treatment plan for decreasing falls as evidenced by patient reporting and review of EMR  Over the next 120 days, patient will verbalize using fall risk reduction strategies discussed  Over the next 120 days, patient will not experience additional falls  Over the next 120 days, patient will verbalize understanding of plan for reducing fall events and remaining safe in his home environment.   Interventions:   Provided written and verbal education re: Potential causes of falls and Fall prevention strategies  Reviewed medications and discussed potential side effects of medications such as dizziness and frequent urination  Assessed for s/s of orthostatic hypotension  Assessed for falls since last encounter. Patient had a fall "three nights ago" Friday night on  10-12-2019. Denies any head injuries. Larey Seat out of his "office chair", was on the floor for a while and made it on the floor to his bed and was able to pull himself up off of the floor. Denies any injuries. 11-26-2019: Denies any new falls  Assessed patients knowledge of fall risk prevention secondary to previously provided education.  Assessed working status of life alert bracelet and patient adherence- unable to assess this call. Will assess on next outreach. The patient had to take an incoming call concerning transportation.   Provided patient information for fall alert systems  Reviewed scheduled/upcoming provider appointments including: appointment follow up hospital discharge with pcp on 11-30-2019  Patient Self Care Activities:   Utilize cane/walker (assistive device) appropriately with all ambulation  De-clutter walkways  Change positions slowly  Wear secure fitting shoes at all times with ambulation  Utilize home lighting for dim lit areas  Have self and pet awareness at all times  Plan:  CCM RN CM will follow up in 30 - 60 days   Please see past updates related to this goal by clicking on the "Past Updates" button in the selected goal         Patient verbalizes understanding of instructions provided today.   Telephone follow up appointment with care management team member scheduled for: 01-14-2020 at 10:15 am  Alto Denver RN, MSN, CCM Community Care Coordinator [facility]   [facility] [place] Mobile: [phone]

## 2019-11-28 ENCOUNTER — Other Ambulatory Visit: Payer: Self-pay

## 2019-11-28 DIAGNOSIS — E11622 Type 2 diabetes mellitus with other skin ulcer: Secondary | ICD-10-CM | POA: Diagnosis not present
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
## 2019-11-28 NOTE — Progress Notes (Signed)
TORRIN, CRIHFIELD ([id number]) Visit Report for 11/28/2019 Arrival Information Details Patient Name: [name] [name], [name] [name] Date of Service: 11/28/2019 2:15 PM Medical Record Number: [id number] Patient Account Number: [id number] Date of Birth/Sex: July 17, 1960 (59 y.o. M) Treating RN: Rodell Perna Primary Care Maryum Batterson: Saralyn Pilar Other Clinician: Referring Merdith Boyd: Saralyn Pilar Treating Julie Nay/Extender: Altamese [place] in Treatment: 66 Visit Information History Since Last Visit Added or deleted any medications: No Patient Arrived: Wheel Chair Any new allergies or adverse reactions: No Arrival Time: 14:37 Had a fall or experienced change in No Accompanied By: self activities of daily living that may affect Transfer Assistance: None risk of falls: Patient Identification Verified: Yes Signs or symptoms of abuse/neglect since last visito No Patient Requires Transmission-Based No Hospitalized since last visit: No Precautions: Has Dressing in Place as Prescribed: Yes Patient Has Alerts: Yes Pain Present Now: No Patient Alerts: Patient on Blood Thinner Warfarin Electronic Signature(s) Signed: 11/28/2019 2:38:00 PM By: Rodell Perna Entered By: Rodell Perna on 11/28/2019 14:38:00 [name] [name] ([id number]) -------------------------------------------------------------------------------- Compression Therapy Details Patient Name: [name] [name] Date of Service: 11/28/2019 2:15 PM Medical Record Number: [id number] Patient Account Number: [id number] Date of Birth/Sex: 1960/11/15 (59 y.o. M) Treating RN: Rodell Perna Primary Care Gurney Balthazor: Saralyn Pilar Other Clinician: Referring Rachael Ferrie: Saralyn Pilar Treating Ashani Pumphrey/Extender: Altamese [place] in Treatment: 29 Compression Therapy Performed for Wound Assessment: Wound #10 Right,Distal,Medial Lower Leg Performed By: Clinician Rodell Perna, RN Compression Type: Four  Layer Electronic Signature(s) Signed: 11/28/2019 2:39:24 PM By: Rodell Perna Entered By: Rodell Perna on 11/28/2019 14:39:24 [name] [name] ([id number]) -------------------------------------------------------------------------------- Compression Therapy Details Patient Name: [name] [name] Date of Service: 11/28/2019 2:15 PM Medical Record Number: [id number] Patient Account Number: [id number] Date of Birth/Sex: [birth year] (59 y.o. M) Treating RN: Rodell Perna Primary Care Kimbria Camposano: Saralyn Pilar Other Clinician: Referring Eman Rynders: Saralyn Pilar Treating Raynald Rouillard/Extender: Altamese [place] in Treatment: 29 Compression Therapy Performed for Wound Assessment: Wound #5 Left,Lateral Lower Leg Performed By: Clinician Rodell Perna, RN Compression Type: Four Layer Electronic Signature(s) Signed: 11/28/2019 2:39:25 PM By: Rodell Perna Entered By: Rodell Perna on 11/28/2019 14:39:24 [name] [name] ([id number]) -------------------------------------------------------------------------------- Encounter Discharge Information Details Patient Name: [name] [name] Date of Service: 11/28/2019 2:15 PM Medical Record Number: [id number] Patient Account Number: [id number] Date of Birth/Sex: 12-31-1960 (59 y.o. M) Treating RN: Rodell Perna Primary Care Aeriel Boulay: Saralyn Pilar Other Clinician: Referring Aisling Emigh: Saralyn Pilar Treating Shavy Beachem/Extender: Altamese [place] in Treatment: 27 Encounter Discharge Information Items Discharge Condition: Stable Ambulatory Status: Walker Discharge Destination: Home Transportation: Private Auto Accompanied By: self Schedule Follow-up Appointment: Yes Clinical Summary of Care: Electronic Signature(s) Signed: 11/28/2019 2:40:09 PM By: Rodell Perna Entered By: Rodell Perna on 11/28/2019 14:40:08 [name] [name]  ([id number]) -------------------------------------------------------------------------------- Wound Assessment Details Patient Name: [name] [name] Date of Service: 11/28/2019 2:15 PM Medical Record Number: [id number] Patient Account Number: [id number] Date of Birth/Sex: 11/27/1960 (59 y.o. M) Treating RN: Rodell Perna Primary Care Devota Viruet: Saralyn Pilar Other Clinician: Referring Annarae Macnair: Saralyn Pilar Treating Sherida Dobkins/Extender: Altamese [place] in Treatment: 29 Wound Status Wound Number: 10 Primary Venous Leg Ulcer Etiology: Wound Location: Right, Distal, Medial Lower Leg Wound Open Wounding Event: Gradually Appeared Status: Date Acquired: 11/01/2019 Comorbid Asthma, Chronic Obstructive Pulmonary Disease (COPD), Weeks Of Treatment: 1 History: Sleep Apnea, Arrhythmia, Congestive Heart Failure, Clustered Wound: No Hypertension, Myocardial Infarction, Type II Diabetes Photos Wound Measurements Length: (cm) 1.5 Width: (cm) 1 Depth: (cm) 0.1 Area: (cm) 1.178 Volume: (cm) 0.118 % Reduction in Area: -15.4% % Reduction in Volume: -15.7% Epithelialization: None Wound Description  Classification: Full Thickness Without Exposed Support Structure s Foul Odor After Cleansing: No Slough/Fibrino No Wound Bed Granulation Amount: None Present (0%) Exposed Structure Necrotic Amount: Large (67-100%) Fascia Exposed: No Necrotic Quality: Adherent Slough Fat Layer (Subcutaneous Tissue) Exposed: Yes Tendon Exposed: No Muscle Exposed: No Joint Exposed: No Bone Exposed: No Treatment Notes Wound #10 (Right, Distal, Medial Lower Leg) Notes scell, ABD, 4 layer bilateral Electronic Signature(s) Signed: 11/28/2019 2:38:37 PM By: Rodell Perna Entered By: Rodell Perna on 11/28/2019 14:38:36 [name] [name] ([id number]) TEIGEN, BELLIN ([id number]) -------------------------------------------------------------------------------- Wound Assessment Details Patient  Name: [name] [name] Date of Service: 11/28/2019 2:15 PM Medical Record Number: [id number] Patient Account Number: [id number] Date of Birth/Sex: 05/10/1960 (59 y.o. M) Treating RN: Rodell Perna Primary Care Lamar Meter: Saralyn Pilar Other Clinician: Referring Genene Kilman: Saralyn Pilar Treating Kweli Grassel/Extender: Altamese  in Treatment: 29 Wound Status Wound Number: 5 Primary Lymphedema Etiology: Wound Location: Left, Lateral Lower Leg Wound Open Wounding Event: Gradually Appeared Status: Date Acquired: 02/12/2019 Comorbid Asthma, Chronic Obstructive Pulmonary Disease (COPD), Weeks Of Treatment: 29 History: Sleep Apnea, Arrhythmia, Congestive Heart Failure, Clustered Wound: Yes Hypertension, Myocardial Infarction, Type II Diabetes Photos Wound Measurements Length: (cm) 4 Width: (cm) 6.5 Depth: (cm) 0.1 Clustered Quantity: 3 Area: (cm) 20.42 Volume: (cm) 2.042 % Reduction in Area: -3.2% % Reduction in Volume: -3.2% Epithelialization: None Wound Description Classification: Full Thickness Without Exposed Support Struct Wound Margin: Flat and Intact Exudate Amount: Large Exudate Type: Serous Exudate Color: amber ures Foul Odor After Cleansing: No Slough/Fibrino Yes Wound Bed Granulation Amount: Medium (34-66%) Exposed Structure Granulation Quality: Pink Fascia Exposed: No Necrotic Amount: Medium (34-66%) Fat Layer (Subcutaneous Tissue) Exposed: Yes Necrotic Quality: Adherent Slough Tendon Exposed: No Muscle Exposed: No Joint Exposed: No Bone Exposed: No Treatment Notes Wound #5 (Left, Lateral Lower Leg) Notes scell, ABD, 4 layer bilateral CYRIS, MAALOUF ([id number]) Electronic Signature(s) Signed: 11/28/2019 2:39:01 PM By: Rodell Perna Entered By: Rodell Perna on 11/28/2019 14:39:00

## 2019-11-29 ENCOUNTER — Ambulatory Visit: Payer: Medicaid Other | Attending: Family | Admitting: Family

## 2019-11-29 ENCOUNTER — Encounter: Payer: Self-pay | Admitting: Family

## 2019-11-29 VITALS — BP 143/83 | HR 90 | Resp 18 | Ht 68.0 in | Wt >= 6400 oz

## 2019-11-29 DIAGNOSIS — Z7901 Long term (current) use of anticoagulants: Secondary | ICD-10-CM | POA: Insufficient documentation

## 2019-11-29 DIAGNOSIS — I252 Old myocardial infarction: Secondary | ICD-10-CM | POA: Insufficient documentation

## 2019-11-29 DIAGNOSIS — I5032 Chronic diastolic (congestive) heart failure: Secondary | ICD-10-CM

## 2019-11-29 DIAGNOSIS — F329 Major depressive disorder, single episode, unspecified: Secondary | ICD-10-CM | POA: Diagnosis not present

## 2019-11-29 DIAGNOSIS — I4891 Unspecified atrial fibrillation: Secondary | ICD-10-CM | POA: Insufficient documentation

## 2019-11-29 DIAGNOSIS — G4733 Obstructive sleep apnea (adult) (pediatric): Secondary | ICD-10-CM | POA: Diagnosis not present

## 2019-11-29 DIAGNOSIS — Z79890 Hormone replacement therapy (postmenopausal): Secondary | ICD-10-CM | POA: Insufficient documentation

## 2019-11-29 DIAGNOSIS — F419 Anxiety disorder, unspecified: Secondary | ICD-10-CM | POA: Diagnosis not present

## 2019-11-29 DIAGNOSIS — I890 Lymphedema, not elsewhere classified: Secondary | ICD-10-CM

## 2019-11-29 DIAGNOSIS — K219 Gastro-esophageal reflux disease without esophagitis: Secondary | ICD-10-CM | POA: Diagnosis not present

## 2019-11-29 DIAGNOSIS — I110 Hypertensive heart disease with heart failure: Secondary | ICD-10-CM | POA: Insufficient documentation

## 2019-11-29 DIAGNOSIS — F32A Depression, unspecified: Secondary | ICD-10-CM | POA: Diagnosis not present

## 2019-11-29 DIAGNOSIS — J45909 Unspecified asthma, uncomplicated: Secondary | ICD-10-CM | POA: Insufficient documentation

## 2019-11-29 DIAGNOSIS — J449 Chronic obstructive pulmonary disease, unspecified: Secondary | ICD-10-CM | POA: Insufficient documentation

## 2019-11-29 DIAGNOSIS — Z86718 Personal history of other venous thrombosis and embolism: Secondary | ICD-10-CM | POA: Insufficient documentation

## 2019-11-29 DIAGNOSIS — Z86711 Personal history of pulmonary embolism: Secondary | ICD-10-CM | POA: Insufficient documentation

## 2019-11-29 DIAGNOSIS — Z79899 Other long term (current) drug therapy: Secondary | ICD-10-CM | POA: Insufficient documentation

## 2019-11-29 DIAGNOSIS — I10 Essential (primary) hypertension: Secondary | ICD-10-CM

## 2019-11-29 NOTE — Patient Instructions (Signed)
Continue weighing daily and call for an overnight weight gain of > 2 pounds or a weekly weight gain of >5 pounds. 

## 2019-11-29 NOTE — Addendum Note (Signed)
Addended by: Clarisa Kindred A on: 11/29/2019 01:39 PM   Modules accepted: Orders

## 2019-11-30 ENCOUNTER — Ambulatory Visit (INDEPENDENT_AMBULATORY_CARE_PROVIDER_SITE_OTHER): Payer: Medicaid Other | Admitting: Family Medicine

## 2019-11-30 ENCOUNTER — Other Ambulatory Visit: Payer: Self-pay

## 2019-11-30 ENCOUNTER — Encounter: Payer: Self-pay | Admitting: Family Medicine

## 2019-11-30 VITALS — BP 140/82 | HR 101 | Temp 99.3°F | Resp 16

## 2019-11-30 DIAGNOSIS — J441 Chronic obstructive pulmonary disease with (acute) exacerbation: Secondary | ICD-10-CM

## 2019-11-30 DIAGNOSIS — J432 Centrilobular emphysema: Secondary | ICD-10-CM

## 2019-11-30 MED ORDER — LEVOFLOXACIN 500 MG PO TABS: 500.0000 mg | ORAL_TABLET | Freq: Every day | ORAL | 0 refills | Status: DC

## 2019-11-30 MED ORDER — PREDNISONE 50 MG PO TABS: 50.0000 mg | ORAL_TABLET | Freq: Every day | ORAL | 0 refills | Status: DC

## 2019-11-30 NOTE — Patient Instructions (Addendum)
Thank you for coming to the office today.  Adjust warfarin dose by hold 1st dose when taking Levaquin 500 daily for 5 days and Prednisone 50 x 5 days.   Please schedule a Follow-up Appointment to: Return if symptoms worsen or fail to improve, for when ready return for referrals as requested.  If you have any other questions or concerns, please feel free to call the office or send a message through MyChart. You may also schedule an earlier appointment if necessary.  Additionally, you may be receiving a survey about your experience at our office within a few days to 1 week by e-mail or mail. We value your feedback.  Saralyn Pilar, DO [facility], [place]

## 2019-11-30 NOTE — Progress Notes (Signed)
Subjective:    Patient ID: [name] [name], male    DOB: Jan 16, 1961, 59 y.o.   MRN: [id number]  [name] [name] is a 59 y.o. male presenting on 11/30/2019 for Follow-up (btw sinus infection onset 2 weeks nasal congestion, cough, SOB and mask makes it worst as per patient --jointache--not had covid vaccine)   HPI   Centrilobular Emphysema COPD Exacerbation Recent history, Last visit Cardiology 11/29/19, thought that he was at euvolemic baseline, some weight down, on diuretic regimen. Today he has worsened with coughing and sputum production thicker and tight breathing by his report without wheezing that he is aware of, he has inhaler not always using albuterol. He admits some sinus congestion drainage and general joint aches. He has not been vaccinated to COVID, had family member with covid recently >3 weeks ago. No direct exposure. He has had prior COPD flare in past usually does well with prednisone and antibiotics He is on warfarin for anticoagulation   Depression screen [facility] 2/9 10/12/2019 06/21/2019 05/10/2019  Decreased Interest 0 3 0  Down, Depressed, Hopeless 2 3 3   PHQ - 2 Score 2 6 3   Altered sleeping 0 1 1  Tired, decreased energy 3 1 3   Change in appetite 0 1 1  Feeling bad or failure about yourself  0 1 1  Trouble concentrating 0 1 1  Moving slowly or fidgety/restless 0 1 0  Suicidal thoughts 0 1 0  PHQ-9 Score 5 13 10   Difficult doing work/chores Very difficult Very difficult Very difficult  Some recent data might be hidden    Social History   Tobacco Use  . Smoking status: Never Smoker  . Smokeless tobacco: Never Used  Vaping Use  . Vaping Use: Never used  Substance Use Topics  . Alcohol use: Yes  . Drug use: Never    Review of Systems Per HPI unless specifically indicated above     Objective:    BP 140/82   Pulse (!) 101   Temp 99.3 F (37.4 C) (Temporal)   Resp 16   SpO2 99%   Wt Readings from Last 3 Encounters:  11/29/19 (!) 410 lb (186 kg)   11/10/19 (!) 417 lb 6.4 oz (189.3 kg)  10/12/19 (!) 410 lb (186 kg)    Physical Exam Vitals and nursing note reviewed.  Constitutional:      General: He is not in acute distress.    Appearance: He is well-developed. He is obese. He is not diaphoretic.     Comments: Well-appearing, comfortable, cooperative  HENT:     Head: Normocephalic and atraumatic.  Eyes:     General:        Right eye: No discharge.        Left eye: No discharge.     Conjunctiva/sclera: Conjunctivae normal.  Neck:     Thyroid: No thyromegaly.  Cardiovascular:     Rate and Rhythm: Normal rate and regular rhythm.     Heart sounds: Normal heart sounds. No murmur heard.   Pulmonary:     Effort: Pulmonary effort is normal. No respiratory distress.     Breath sounds: No wheezing or rales.     Comments: Some reduced air movement without overt wheezing. Musculoskeletal:     Cervical back: Normal range of motion and neck supple.     Right lower leg: Edema present.     Left lower leg: Edema present.     Comments: In power wheelchair  Lymphadenopathy:     Cervical: No cervical  adenopathy.  Skin:    General: Skin is warm and dry.     Findings: No erythema or rash.  Neurological:     Mental Status: He is alert and oriented to person, place, and time.  Psychiatric:        Behavior: Behavior normal.     Comments: Well groomed, good eye contact, normal speech and thoughts    Results for orders placed or performed during the hospital encounter of 11/07/19  SARS Coronavirus 2 by RT PCR (hospital order, performed in [facility] hospital lab) Nasopharyngeal Nasopharyngeal Swab   Specimen: Nasopharyngeal Swab  Result Value Ref Range   SARS Coronavirus 2 NEGATIVE NEGATIVE  Brain natriuretic peptide  Result Value Ref Range   B Natriuretic Peptide 196.1 (H) 0.0 - 100.0 pg/mL  CBC  Result Value Ref Range   WBC 7.6 4.0 - 10.5 K/uL   RBC 3.95 (L) 4.22 - 5.81 MIL/uL   Hemoglobin 11.7 (L) 13.0 - 17.0 g/dL   HCT 32.9  (L) 39 - 52 %   MCV 93.2 80.0 - 100.0 fL   MCH 29.6 26.0 - 34.0 pg   MCHC 31.8 30.0 - 36.0 g/dL   RDW 51.8 (H) 84.1 - 66.0 %   Platelets 256 150 - 400 K/uL   nRBC 0.0 0.0 - 0.2 %  Comprehensive metabolic panel  Result Value Ref Range   Sodium 137 135 - 145 mmol/L   Potassium 3.2 (L) 3.5 - 5.1 mmol/L   Chloride 97 (L) 98 - 111 mmol/L   CO2 26 22 - 32 mmol/L   Glucose, Bld 149 (H) 70 - 99 mg/dL   BUN 5 (L) 6 - 20 mg/dL   Creatinine, Ser 6.30 0.61 - 1.24 mg/dL   Calcium 8.0 (L) 8.9 - 10.3 mg/dL   Total Protein 7.3 6.5 - 8.1 g/dL   Albumin 3.2 (L) 3.5 - 5.0 g/dL   AST 35 15 - 41 U/L   ALT 17 0 - 44 U/L   Alkaline Phosphatase 79 38 - 126 U/L   Total Bilirubin 0.7 0.3 - 1.2 mg/dL   GFR calc non Af Amer >60 >60 mL/min   GFR calc Af Amer >60 >60 mL/min   Anion gap 14 5 - 15  Blood gas, venous  Result Value Ref Range   pH, Ven 7.38 7.25 - 7.43   pCO2, Ven 50 44 - 60 mmHg   pO2, Ven 42.0 32 - 45 mmHg   Bicarbonate 29.6 (H) 20.0 - 28.0 mmol/L   Acid-Base Excess 3.6 (H) 0.0 - 2.0 mmol/L   O2 Saturation 76.3 %   Patient temperature 37.0    Collection site VEIN    Sample type VENOUS   Protime-INR  Result Value Ref Range   Prothrombin Time 20.0 (H) 11.4 - 15.2 seconds   INR 1.8 (H) 0.8 - 1.2  Protime-INR  Result Value Ref Range   Prothrombin Time 17.8 (H) 11.4 - 15.2 seconds   INR 1.5 (H) 0.8 - 1.2  Urine Drug Screen, Qualitative ([facility] only)  Result Value Ref Range   Tricyclic, Ur Screen NONE DETECTED NONE DETECTED   Amphetamines, Ur Screen NONE DETECTED NONE DETECTED   MDMA (Ecstasy)Ur Screen NONE DETECTED NONE DETECTED   Cocaine Metabolite,Ur [place] NONE DETECTED NONE DETECTED   Opiate, Ur Screen POSITIVE (A) NONE DETECTED   Phencyclidine (PCP) Ur S NONE DETECTED NONE DETECTED   Cannabinoid 50 Ng, Ur [place] POSITIVE (A) NONE DETECTED   Barbiturates, Ur Screen NONE DETECTED NONE  DETECTED   Benzodiazepine, Ur Scrn NONE DETECTED NONE DETECTED   Methadone Scn, Ur NONE DETECTED NONE  DETECTED  Hemoglobin A1c  Result Value Ref Range   Hgb A1c MFr Bld 6.2 (H) 4.8 - 5.6 %   Mean Plasma Glucose 131.24 mg/dL  Lipid panel  Result Value Ref Range   Cholesterol 175 0 - 200 mg/dL   Triglycerides 63 <865 mg/dL   HDL 75 >78 mg/dL   Total CHOL/HDL Ratio 2.3 RATIO   VLDL 13 0 - 40 mg/dL   LDL Cholesterol 87 0 - 99 mg/dL  Basic metabolic panel  Result Value Ref Range   Sodium 133 (L) 135 - 145 mmol/L   Potassium 4.4 3.5 - 5.1 mmol/L   Chloride 94 (L) 98 - 111 mmol/L   CO2 27 22 - 32 mmol/L   Glucose, Bld 193 (H) 70 - 99 mg/dL   BUN 10 6 - 20 mg/dL   Creatinine, Ser 4.69 0.61 - 1.24 mg/dL   Calcium 8.0 (L) 8.9 - 10.3 mg/dL   GFR calc non Af Amer >60 >60 mL/min   GFR calc Af Amer >60 >60 mL/min   Anion gap 12 5 - 15  Magnesium  Result Value Ref Range   Magnesium 1.4 (L) 1.7 - 2.4 mg/dL  CBC  Result Value Ref Range   WBC 5.6 4.0 - 10.5 K/uL   RBC 3.85 (L) 4.22 - 5.81 MIL/uL   Hemoglobin 11.7 (L) 13.0 - 17.0 g/dL   HCT 62.9 (L) 39 - 52 %   MCV 90.9 80.0 - 100.0 fL   MCH 30.4 26.0 - 34.0 pg   MCHC 33.4 30.0 - 36.0 g/dL   RDW 52.8 (H) 41.3 - 24.4 %   Platelets 235 150 - 400 K/uL   nRBC 0.0 0.0 - 0.2 %  Basic metabolic panel  Result Value Ref Range   Sodium 133 (L) 135 - 145 mmol/L   Potassium 4.7 3.5 - 5.1 mmol/L   Chloride 94 (L) 98 - 111 mmol/L   CO2 28 22 - 32 mmol/L   Glucose, Bld 220 (H) 70 - 99 mg/dL   BUN 23 (H) 6 - 20 mg/dL   Creatinine, Ser 0.10 0.61 - 1.24 mg/dL   Calcium 8.5 (L) 8.9 - 10.3 mg/dL   GFR calc non Af Amer >60 >60 mL/min   GFR calc Af Amer >60 >60 mL/min   Anion gap 11 5 - 15  Protime-INR  Result Value Ref Range   Prothrombin Time 18.7 (H) 11.4 - 15.2 seconds   INR 1.6 (H) 0.8 - 1.2  Magnesium  Result Value Ref Range   Magnesium 1.9 1.7 - 2.4 mg/dL  Basic metabolic panel  Result Value Ref Range   Sodium 136 135 - 145 mmol/L   Potassium 3.8 3.5 - 5.1 mmol/L   Chloride 94 (L) 98 - 111 mmol/L   CO2 33 (H) 22 - 32 mmol/L    Glucose, Bld 191 (H) 70 - 99 mg/dL   BUN 24 (H) 6 - 20 mg/dL   Creatinine, Ser 2.72 0.61 - 1.24 mg/dL   Calcium 8.5 (L) 8.9 - 10.3 mg/dL   GFR calc non Af Amer >60 >60 mL/min   GFR calc Af Amer >60 >60 mL/min   Anion gap 9 5 - 15  Protime-INR  Result Value Ref Range   Prothrombin Time 22.1 (H) 11.4 - 15.2 seconds   INR 2.0 (H) 0.8 - 1.2  Glucose, capillary  Result Value Ref Range  Glucose-Capillary 166 (H) 70 - 99 mg/dL  CBC  Result Value Ref Range   WBC 8.5 4.0 - 10.5 K/uL   RBC 4.08 (L) 4.22 - 5.81 MIL/uL   Hemoglobin 12.2 (L) 13.0 - 17.0 g/dL   HCT 16.1 (L) 39 - 52 %   MCV 94.9 80.0 - 100.0 fL   MCH 29.9 26.0 - 34.0 pg   MCHC 31.5 30.0 - 36.0 g/dL   RDW 09.6 (H) 04.5 - 40.9 %   Platelets 211 150 - 400 K/uL   nRBC 0.0 0.0 - 0.2 %  Glucose, capillary  Result Value Ref Range   Glucose-Capillary 189 (H) 70 - 99 mg/dL  Glucose, capillary  Result Value Ref Range   Glucose-Capillary 174 (H) 70 - 99 mg/dL  Glucose, capillary  Result Value Ref Range   Glucose-Capillary 108 (H) 70 - 99 mg/dL  Troponin I (High Sensitivity)  Result Value Ref Range   Troponin I (High Sensitivity) 34 (H) <18 ng/L  Troponin I (High Sensitivity)  Result Value Ref Range   Troponin I (High Sensitivity) 37 (H) <18 ng/L  Troponin I (High Sensitivity)  Result Value Ref Range   Troponin I (High Sensitivity) 31 (H) <18 ng/L  Troponin I (High Sensitivity)  Result Value Ref Range   Troponin I (High Sensitivity) 25 (H) <18 ng/L      Assessment & Plan:   Problem List Items Addressed This Visit    Centrilobular emphysema (HCC)   Relevant Medications   predniSONE (DELTASONE) 50 MG tablet    Other Visit Diagnoses    COPD with acute exacerbation (HCC)    -  Primary   Relevant Medications   predniSONE (DELTASONE) 50 MG tablet   levofloxacin (LEVAQUIN) 500 MG tablet      Consistent with mild acute exacerbation of COPD with likely initial sinusitis now with worsening productive cough. Similar to  prior exacerbations. Other factors with morbid obesity, sedentary, however he is euvolemic without worsening edema from CHF, on diuretic. No crackles in lungs - No hypoxia (99% on RA), elevated temp but not at level of fever  Plan: 1. Start Prednisone 50mg  x 5 day steroid burst 2. Start taking Levaquin antibiotic 500mg  daily x 5 days (reduced to 5 days due to warfarin) and also he may skip or hold warfarin on day 1 of medication, precautions given. 3. Use albuterol q 4 hr regularly x 2-3 days. Continue maintenance inhalers  RTC about 1 week if not improving, otherwise strict return criteria to go to ED Future may consider CXR  Reviewed his medications and reviewed recent CCM updates, patient has had updates to his med list and will continue to receive via pill pack.  He is working on changing his medicaid card so we can refer in future, he asks about variety of referrals including home care, needs authorization he will work on paperwork and let know when ready to be seen for that.  Also advised him about the Palliative referral by Cardiology. He will wait to hear back on this.  Meds ordered this encounter  Medications  . predniSONE (DELTASONE) 50 MG tablet    Sig: Take 1 tablet (50 mg total) by mouth daily with breakfast.    Dispense:  5 tablet    Refill:  0  . levofloxacin (LEVAQUIN) 500 MG tablet    Sig: Take 1 tablet (500 mg total) by mouth daily. 5 days    Dispense:  5 tablet    Refill:  0  Follow up plan: Return if symptoms worsen or fail to improve, for when ready return for referrals as requested.   Saralyn Pilar, DO [facility] 11/30/2019, 11:14 AM

## 2019-12-05 ENCOUNTER — Telehealth: Payer: Self-pay | Admitting: Primary Care

## 2019-12-05 NOTE — Telephone Encounter (Signed)
Called patient's home and cell number to offer to schedule a Palliative Consult, no answer at either number.  Left message at both numbers reason for call along with my name and call back number.

## 2019-12-07 ENCOUNTER — Ambulatory Visit: Payer: Medicaid Other | Admitting: Physician Assistant

## 2019-12-12 ENCOUNTER — Telehealth: Payer: Self-pay

## 2019-12-12 ENCOUNTER — Telehealth: Payer: Self-pay | Admitting: Pharmacist

## 2019-12-12 NOTE — Telephone Encounter (Signed)
Try calling both number unable to reach the patient for clarification. prednisone and Levaquin was send on 11/30/2019.

## 2019-12-12 NOTE — Progress Notes (Signed)
  Chronic Care Management   Outreach Note  12/12/2019 Name: Zygmunt Mcglinn MRN: [id number] DOB: [birth year]  Referred by: [name], DO Reason for referral : No chief complaint on file.   Was unable to reach patient via telephone today and have left HIPAA compliant voicemail asking patient to return my call.    Follow Up Plan: Will collaborate with Care Guide to outreach to schedule follow up with me  Duanne Moron, PharmD, [facility] Clinical Pharmacist [facility] Care Management [phone]

## 2019-12-12 NOTE — Telephone Encounter (Signed)
Copied from CRM [id number]. Topic: General - Other >> Dec 11, 2019  3:32 PM Tamela Oddi wrote: Reason for CRM: Patient called to ask the nurse to call him regarding some medication that the doctor said he would send to the pharmacy at his last appt.  Patient did not know the name of the medication but said it was for an antibiotic and another med.  Please advise and call patient to discuss at [phone]

## 2019-12-14 ENCOUNTER — Telehealth: Payer: Self-pay | Admitting: Family Medicine

## 2019-12-14 DIAGNOSIS — E039 Hypothyroidism, unspecified: Secondary | ICD-10-CM

## 2019-12-14 DIAGNOSIS — J441 Chronic obstructive pulmonary disease with (acute) exacerbation: Secondary | ICD-10-CM

## 2019-12-14 MED ORDER — PREDNISONE 50 MG PO TABS: 50.0000 mg | ORAL_TABLET | Freq: Every day | ORAL | 0 refills | Status: DC

## 2019-12-14 MED ORDER — LEVOFLOXACIN 500 MG PO TABS: 500.0000 mg | ORAL_TABLET | Freq: Every day | ORAL | 0 refills | Status: DC

## 2019-12-14 NOTE — Telephone Encounter (Signed)
Pt is calling and [facility] in [place] did not received prednisone nor levaquin on 11-30-19. The e-scribed said verification status not available for this orders. Please resend. Pt was seen on 11/30/2019. Pt would like a call once rx has been sent

## 2019-12-14 NOTE — Telephone Encounter (Signed)
Pt. Stated the pharmacy has not rec'd the antibiotic or Prednisone that was ordered on 11/30/19.  Reported he has called several times to tell the office that the medication was never rec'd at the pharmacy, but has gotten no response.  Reported that he is short of breath, both at rest and activity, is coughing up green, thick mucus, has had fever, (reported fever 101.5 two days ago; has not checked it since then).  Reported has chest discomfort at 7/10, due to the congestion.  Pt. Refusing to go to the ER.  Stated "I just need the medicine called in."     Phone call to office to discuss with Flow Coordinator; left vm re: patient's symptoms, as noted above.  Will proceed with sending in the antibiotic and Prednisone to pharmacy that should have been ordered on 10/15.

## 2019-12-17 NOTE — Telephone Encounter (Signed)
I called and f/u with the patient concerning his symptoms he reported SOB, productive coughing w/ greenish thick mucus and fever. He reports he's been taking the Levaquin & prednisone x 3 days w/ no improvement.    he complains today of SOB, productive coughing w/ whitish phlegm, intermittent fever 101 temp, severe headache and chest tightness. He states that his SOB is so bad that he is currently having to use his CPAP machine and oxygen and he still feel like he's not taking in a good breath.  He said he was admitted in the hospital back at the end of August and was diagnose with exacerbation of congestive heart failure with COPD. He said the symptoms feel the same as they did then. I informed the patient that he need to seek emergent care. I advised the patient to go and be evaluated at the ER. I informed him that he could possible need an chest xray and maybe IV antibiotics to treat his symptoms. He verbalize understanding.

## 2019-12-18 ENCOUNTER — Ambulatory Visit: Payer: Medicaid Other | Admitting: Licensed Clinical Social Worker

## 2019-12-18 NOTE — Chronic Care Management (AMB) (Signed)
  Care Management   Follow Up Note   12/18/2019 Name: [name] [name] MRN: [id number] DOB: 1960-05-30  Referred by: [name] Cords, DO Reason for referral : Care Coordination   [name] [name] is a 59 y.o. year old male who is a primary care patient of [name] Cords, DO. The care management team was consulted for assistance with care management and care coordination needs.    Review of patient status, including review of consultants reports, relevant laboratory and other test results, and collaboration with appropriate care team members and the patient's provider was performed as part of comprehensive patient evaluation and provision of chronic care management services.    SDOH (Social Determinants of Health) assessments performed: Yes See Care Plan activities for detailed interventions related to [name] [name])     Advanced Directives: See Care Plan and Vynca application for related entries.   Goals Addressed    .  SW-I need financial assistance (pt-stated)        Current Barriers:  . Financial constraints . Limited social support . Level of care concerns . ADL IADL limitations . Mental Health Concerns  . Limited education about how to apply for disability and for insurance coverage* . Limited access to caregiver . Lacks knowledge of community resource: PCS enrollment  Clinical Social Work Clinical Goal(s):  [name] [name] Kitchen Over the next 90 days, client will work with SW to address concerns related to gaining an aide through [name] [name]  Interventions: . Patient interviewed and appropriate assessments performed . Provided mental health counseling with regard to grief as patient lost spouse last year. Active and reflective listening provided.   . Provided patient with information about the disability and insurance enrollment process. . Discussed plans with patient for ongoing care management follow up and provided patient with direct contact information for care management  team . Advised patient to consider [name] [name] for [name] [name] as he has limited in home support other than his sister and neighbor . Patient confirms stable transportation to all appointments through [facility] transportation. . Patient reports that his sister has COVID and is not able to check on him as much.  . Assisted patient/caregiver with obtaining information about health plan benefits . Provided education and assistance to client regarding Advanced Directives. . Provided education to patient regarding level of care options. . Patient reports that he is finally starting to feel better and has two days left of his antibiotic. Patient main source of support includes his neighbor and sister 24. He reports that his neighbor comes to his residence daily.  . Patient reports that his SOB is still the same. . Patient is still unable to gain HH. The patient has not gotten his new insurance card which is need for [name] [name] referral. Patient is agreeable to contact them today to inquire. Number provided to patient.   Patient Self Care Activities:  . Attends all scheduled provider appointments . Calls provider office for new concerns or questions  Please see past updates related to this goal by clicking on the "Past Updates" button in the selected goal       The care management team will reach out to the patient again over the next 60-90 days.   [name] [name], [name] [name], [name] [name], [name] [name] [name] [place]  [email or web address] Phone: [phone]

## 2019-12-21 ENCOUNTER — Other Ambulatory Visit
Admission: RE | Admit: 2019-12-21 | Discharge: 2019-12-21 | Disposition: A | Discharge status: Home / Self Care | Payer: Medicaid Other | Source: Ambulatory Visit | Attending: Physician Assistant | Admitting: Physician Assistant

## 2019-12-21 ENCOUNTER — Other Ambulatory Visit: Payer: Self-pay

## 2019-12-21 ENCOUNTER — Encounter: Payer: Medicaid Other | Attending: Physician Assistant | Admitting: Physician Assistant

## 2019-12-21 ENCOUNTER — Telehealth: Payer: Self-pay

## 2019-12-21 DIAGNOSIS — E11622 Type 2 diabetes mellitus with other skin ulcer: Secondary | ICD-10-CM | POA: Insufficient documentation

## 2019-12-21 DIAGNOSIS — Z86711 Personal history of pulmonary embolism: Secondary | ICD-10-CM | POA: Insufficient documentation

## 2019-12-21 DIAGNOSIS — Z7901 Long term (current) use of anticoagulants: Secondary | ICD-10-CM | POA: Diagnosis not present

## 2019-12-21 DIAGNOSIS — L97822 Non-pressure chronic ulcer of other part of left lower leg with fat layer exposed: Secondary | ICD-10-CM | POA: Insufficient documentation

## 2019-12-21 DIAGNOSIS — L97812 Non-pressure chronic ulcer of other part of right lower leg with fat layer exposed: Secondary | ICD-10-CM | POA: Diagnosis not present

## 2019-12-21 DIAGNOSIS — I890 Lymphedema, not elsewhere classified: Secondary | ICD-10-CM | POA: Insufficient documentation

## 2019-12-21 DIAGNOSIS — I252 Old myocardial infarction: Secondary | ICD-10-CM | POA: Insufficient documentation

## 2019-12-21 DIAGNOSIS — L089 Local infection of the skin and subcutaneous tissue, unspecified: Secondary | ICD-10-CM | POA: Insufficient documentation

## 2019-12-21 DIAGNOSIS — J449 Chronic obstructive pulmonary disease, unspecified: Secondary | ICD-10-CM | POA: Insufficient documentation

## 2019-12-21 DIAGNOSIS — I5042 Chronic combined systolic (congestive) and diastolic (congestive) heart failure: Secondary | ICD-10-CM | POA: Insufficient documentation

## 2019-12-21 DIAGNOSIS — B9562 Methicillin resistant Staphylococcus aureus infection as the cause of diseases classified elsewhere: Secondary | ICD-10-CM | POA: Diagnosis not present

## 2019-12-21 DIAGNOSIS — I110 Hypertensive heart disease with heart failure: Secondary | ICD-10-CM | POA: Diagnosis not present

## 2019-12-21 DIAGNOSIS — I480 Paroxysmal atrial fibrillation: Secondary | ICD-10-CM | POA: Insufficient documentation

## 2019-12-21 DIAGNOSIS — I872 Venous insufficiency (chronic) (peripheral): Secondary | ICD-10-CM | POA: Insufficient documentation

## 2019-12-21 NOTE — Chronic Care Management (AMB) (Signed)
  Care Management   Note  12/21/2019 Name: [name] [name] MRN: [id number] DOB: 1960/02/26  [name] [name] is a 59 y.o. year old male who is a primary care patient of [name], DO and is actively engaged with the care management team. I reached out to [employer] by phone today to assist with re-scheduling a follow up visit with the Pharmacist  Follow up plan: Unsuccessful telephone outreach attempt made. A HIPAA compliant phone message was left for the patient providing contact information and requesting a return call.  The care management team will reach out to the patient again over the next 7 days.  If patient returns call to provider office, please advise to call Embedded Care Management Care Guide [name] [name]  at [phone]  [name] [name], RMA Care Guide, Embedded Care Coordination [facility]  [place], [place] [address] Direct Dial: [phone] [name]

## 2019-12-22 ENCOUNTER — Inpatient Hospital Stay
Admission: EM | Admit: 2019-12-22 | Discharge: 2019-12-28 | DRG: 871 | Disposition: A | Discharge status: Home / Self Care | Payer: Medicaid Other | Attending: Internal Medicine | Admitting: Internal Medicine

## 2019-12-22 ENCOUNTER — Emergency Department: Payer: Medicaid Other

## 2019-12-22 ENCOUNTER — Observation Stay: Payer: Medicaid Other

## 2019-12-22 ENCOUNTER — Other Ambulatory Visit: Payer: Medicaid Other

## 2019-12-22 ENCOUNTER — Other Ambulatory Visit: Payer: Self-pay

## 2019-12-22 DIAGNOSIS — M549 Dorsalgia, unspecified: Secondary | ICD-10-CM | POA: Diagnosis present

## 2019-12-22 DIAGNOSIS — Z7401 Bed confinement status: Secondary | ICD-10-CM

## 2019-12-22 DIAGNOSIS — I252 Old myocardial infarction: Secondary | ICD-10-CM

## 2019-12-22 DIAGNOSIS — Z7901 Long term (current) use of anticoagulants: Secondary | ICD-10-CM

## 2019-12-22 DIAGNOSIS — M1611 Unilateral primary osteoarthritis, right hip: Secondary | ICD-10-CM | POA: Diagnosis present

## 2019-12-22 DIAGNOSIS — M542 Cervicalgia: Secondary | ICD-10-CM | POA: Diagnosis present

## 2019-12-22 DIAGNOSIS — I890 Lymphedema, not elsewhere classified: Secondary | ICD-10-CM | POA: Diagnosis present

## 2019-12-22 DIAGNOSIS — M19012 Primary osteoarthritis, left shoulder: Secondary | ICD-10-CM | POA: Diagnosis present

## 2019-12-22 DIAGNOSIS — Z86711 Personal history of pulmonary embolism: Secondary | ICD-10-CM

## 2019-12-22 DIAGNOSIS — R069 Unspecified abnormalities of breathing: Secondary | ICD-10-CM | POA: Diagnosis not present

## 2019-12-22 DIAGNOSIS — A4102 Sepsis due to Methicillin resistant Staphylococcus aureus: Principal | ICD-10-CM | POA: Diagnosis present

## 2019-12-22 DIAGNOSIS — J440 Chronic obstructive pulmonary disease with acute lower respiratory infection: Secondary | ICD-10-CM | POA: Diagnosis present

## 2019-12-22 DIAGNOSIS — Z8249 Family history of ischemic heart disease and other diseases of the circulatory system: Secondary | ICD-10-CM

## 2019-12-22 DIAGNOSIS — Z20822 Contact with and (suspected) exposure to covid-19: Secondary | ICD-10-CM | POA: Diagnosis present

## 2019-12-22 DIAGNOSIS — J9601 Acute respiratory failure with hypoxia: Secondary | ICD-10-CM | POA: Diagnosis not present

## 2019-12-22 DIAGNOSIS — G4733 Obstructive sleep apnea (adult) (pediatric): Secondary | ICD-10-CM

## 2019-12-22 DIAGNOSIS — Z79890 Hormone replacement therapy (postmenopausal): Secondary | ICD-10-CM

## 2019-12-22 DIAGNOSIS — I517 Cardiomegaly: Secondary | ICD-10-CM | POA: Diagnosis not present

## 2019-12-22 DIAGNOSIS — G894 Chronic pain syndrome: Secondary | ICD-10-CM | POA: Diagnosis present

## 2019-12-22 DIAGNOSIS — J441 Chronic obstructive pulmonary disease with (acute) exacerbation: Secondary | ICD-10-CM | POA: Diagnosis not present

## 2019-12-22 DIAGNOSIS — M109 Gout, unspecified: Secondary | ICD-10-CM | POA: Diagnosis present

## 2019-12-22 DIAGNOSIS — Z86718 Personal history of other venous thrombosis and embolism: Secondary | ICD-10-CM

## 2019-12-22 DIAGNOSIS — R0602 Shortness of breath: Secondary | ICD-10-CM | POA: Diagnosis present

## 2019-12-22 DIAGNOSIS — I110 Hypertensive heart disease with heart failure: Secondary | ICD-10-CM | POA: Diagnosis not present

## 2019-12-22 DIAGNOSIS — Z801 Family history of malignant neoplasm of trachea, bronchus and lung: Secondary | ICD-10-CM

## 2019-12-22 DIAGNOSIS — F419 Anxiety disorder, unspecified: Secondary | ICD-10-CM | POA: Diagnosis present

## 2019-12-22 DIAGNOSIS — M19011 Primary osteoarthritis, right shoulder: Secondary | ICD-10-CM | POA: Diagnosis present

## 2019-12-22 DIAGNOSIS — X58XXXA Exposure to other specified factors, initial encounter: Secondary | ICD-10-CM | POA: Diagnosis present

## 2019-12-22 DIAGNOSIS — R0789 Other chest pain: Secondary | ICD-10-CM | POA: Diagnosis not present

## 2019-12-22 DIAGNOSIS — J811 Chronic pulmonary edema: Secondary | ICD-10-CM | POA: Diagnosis not present

## 2019-12-22 DIAGNOSIS — M170 Bilateral primary osteoarthritis of knee: Secondary | ICD-10-CM | POA: Diagnosis present

## 2019-12-22 DIAGNOSIS — E662 Morbid (severe) obesity with alveolar hypoventilation: Secondary | ICD-10-CM | POA: Diagnosis present

## 2019-12-22 DIAGNOSIS — I10 Essential (primary) hypertension: Secondary | ICD-10-CM | POA: Diagnosis present

## 2019-12-22 DIAGNOSIS — J9621 Acute and chronic respiratory failure with hypoxia: Secondary | ICD-10-CM

## 2019-12-22 DIAGNOSIS — J9622 Acute and chronic respiratory failure with hypercapnia: Secondary | ICD-10-CM | POA: Diagnosis present

## 2019-12-22 DIAGNOSIS — F32A Depression, unspecified: Secondary | ICD-10-CM | POA: Diagnosis present

## 2019-12-22 DIAGNOSIS — R079 Chest pain, unspecified: Secondary | ICD-10-CM | POA: Diagnosis present

## 2019-12-22 DIAGNOSIS — E785 Hyperlipidemia, unspecified: Secondary | ICD-10-CM | POA: Diagnosis present

## 2019-12-22 DIAGNOSIS — J189 Pneumonia, unspecified organism: Secondary | ICD-10-CM | POA: Diagnosis present

## 2019-12-22 DIAGNOSIS — I509 Heart failure, unspecified: Secondary | ICD-10-CM

## 2019-12-22 DIAGNOSIS — K219 Gastro-esophageal reflux disease without esophagitis: Secondary | ICD-10-CM | POA: Diagnosis present

## 2019-12-22 DIAGNOSIS — I5032 Chronic diastolic (congestive) heart failure: Secondary | ICD-10-CM | POA: Diagnosis present

## 2019-12-22 DIAGNOSIS — G4700 Insomnia, unspecified: Secondary | ICD-10-CM | POA: Diagnosis present

## 2019-12-22 DIAGNOSIS — Z79899 Other long term (current) drug therapy: Secondary | ICD-10-CM

## 2019-12-22 DIAGNOSIS — Z6841 Body Mass Index (BMI) 40.0 and over, adult: Secondary | ICD-10-CM

## 2019-12-22 DIAGNOSIS — G43909 Migraine, unspecified, not intractable, without status migrainosus: Secondary | ICD-10-CM | POA: Diagnosis present

## 2019-12-22 DIAGNOSIS — R0689 Other abnormalities of breathing: Secondary | ICD-10-CM | POA: Diagnosis not present

## 2019-12-22 DIAGNOSIS — H9192 Unspecified hearing loss, left ear: Secondary | ICD-10-CM | POA: Diagnosis present

## 2019-12-22 DIAGNOSIS — A419 Sepsis, unspecified organism: Secondary | ICD-10-CM | POA: Diagnosis present

## 2019-12-22 DIAGNOSIS — E039 Hypothyroidism, unspecified: Secondary | ICD-10-CM | POA: Diagnosis present

## 2019-12-22 DIAGNOSIS — S81801A Unspecified open wound, right lower leg, initial encounter: Secondary | ICD-10-CM | POA: Diagnosis present

## 2019-12-22 DIAGNOSIS — I482 Chronic atrial fibrillation, unspecified: Secondary | ICD-10-CM | POA: Diagnosis present

## 2019-12-22 DIAGNOSIS — I4891 Unspecified atrial fibrillation: Secondary | ICD-10-CM | POA: Diagnosis not present

## 2019-12-22 DIAGNOSIS — Z7952 Long term (current) use of systemic steroids: Secondary | ICD-10-CM

## 2019-12-22 DIAGNOSIS — Z87820 Personal history of traumatic brain injury: Secondary | ICD-10-CM

## 2019-12-22 DIAGNOSIS — L03115 Cellulitis of right lower limb: Secondary | ICD-10-CM | POA: Diagnosis present

## 2019-12-22 DIAGNOSIS — I2510 Atherosclerotic heart disease of native coronary artery without angina pectoris: Secondary | ICD-10-CM | POA: Diagnosis present

## 2019-12-22 LAB — RESPIRATORY PANEL BY RT PCR (FLU A&B, COVID)
Influenza A by PCR: NEGATIVE
Influenza B by PCR: NEGATIVE
SARS Coronavirus 2 by RT PCR: NEGATIVE

## 2019-12-22 LAB — CBC
HCT: 41.2 % (ref 39.0–52.0)
Hemoglobin: 13.1 g/dL (ref 13.0–17.0)
MCH: 29.7 pg (ref 26.0–34.0)
MCHC: 31.8 g/dL (ref 30.0–36.0)
MCV: 93.4 fL (ref 80.0–100.0)
Platelets: 219 10*3/uL (ref 150–400)
RBC: 4.41 MIL/uL (ref 4.22–5.81)
RDW: 16.4 % — ABNORMAL HIGH (ref 11.5–15.5)
WBC: 9.3 10*3/uL (ref 4.0–10.5)
nRBC: 0 % (ref 0.0–0.2)

## 2019-12-22 LAB — COMPREHENSIVE METABOLIC PANEL
ALT: 30 U/L (ref 0–44)
AST: 35 U/L (ref 15–41)
Albumin: 3.1 g/dL — ABNORMAL LOW (ref 3.5–5.0)
Alkaline Phosphatase: 75 U/L (ref 38–126)
Anion gap: 11 (ref 5–15)
BUN: 11 mg/dL (ref 6–20)
CO2: 30 mmol/L (ref 22–32)
Calcium: 7.9 mg/dL — ABNORMAL LOW (ref 8.9–10.3)
Chloride: 96 mmol/L — ABNORMAL LOW (ref 98–111)
Creatinine, Ser: 0.82 mg/dL (ref 0.61–1.24)
GFR, Estimated: >60 mL/min (ref >60)
Glucose, Bld: 160 mg/dL — ABNORMAL HIGH (ref 70–99)
Potassium: 3.7 mmol/L (ref 3.5–5.1)
Sodium: 137 mmol/L (ref 135–145)
Total Bilirubin: 1.5 mg/dL — ABNORMAL HIGH (ref 0.3–1.2)
Total Protein: 6.5 g/dL (ref 6.5–8.1)

## 2019-12-22 LAB — BLOOD GAS, ARTERIAL
Acid-Base Excess: 7 mmol/L — ABNORMAL HIGH (ref 0.0–2.0)
Bicarbonate: 32 mmol/L — ABNORMAL HIGH (ref 20.0–28.0)
Expiratory PAP: 6
FIO2: 1
Inspiratory PAP: 14
Mode: POSITIVE
O2 Saturation: 100 %
Patient temperature: 37
RATE: 8 resp/min
pCO2 arterial: 46 mmHg (ref 32.0–48.0)
pH, Arterial: 7.45 (ref 7.350–7.450)
pO2, Arterial: 394 mmHg — ABNORMAL HIGH (ref 83.0–108.0)

## 2019-12-22 LAB — APTT: aPTT: 32 seconds (ref 24–36)

## 2019-12-22 LAB — BRAIN NATRIURETIC PEPTIDE: B Natriuretic Peptide: 356.8 pg/mL — ABNORMAL HIGH (ref 0.0–100.0)

## 2019-12-22 LAB — PROTIME-INR
INR: 1.3 — ABNORMAL HIGH (ref 0.8–1.2)
Prothrombin Time: 15.8 seconds — ABNORMAL HIGH (ref 11.4–15.2)

## 2019-12-22 LAB — FIBRIN DERIVATIVES D-DIMER (ARMC ONLY): Fibrin derivatives D-dimer (ARMC): 769.9 ng/mL (FEU) — ABNORMAL HIGH (ref 0.00–499.00)

## 2019-12-22 LAB — TROPONIN I (HIGH SENSITIVITY): Troponin I (High Sensitivity): 41 ng/L — ABNORMAL HIGH (ref <18)

## 2019-12-22 MED ORDER — ALLOPURINOL 100 MG PO TABS
100.0000 mg | ORAL_TABLET | Freq: Every day | ORAL | Status: DC
  Administered 2019-12-22 – 2019-12-28 (×7): 100 mg via ORAL
  Filled 2019-12-22 (×7): qty 1

## 2019-12-22 MED ORDER — WARFARIN SODIUM 7.5 MG PO TABS
7.5000 mg | ORAL_TABLET | Freq: Once | ORAL | Status: AC
  Administered 2019-12-22: 7.5 mg via ORAL
  Filled 2019-12-22: qty 1

## 2019-12-22 MED ORDER — ACETAMINOPHEN 325 MG PO TABS
650.0000 mg | ORAL_TABLET | Freq: Four times a day (QID) | ORAL | Status: AC | PRN
  Administered 2019-12-23 (×2): 650 mg via ORAL
  Filled 2019-12-22 (×2): qty 2

## 2019-12-22 MED ORDER — IPRATROPIUM-ALBUTEROL 0.5-2.5 (3) MG/3ML IN SOLN
3.0000 mL | Freq: Once | RESPIRATORY_TRACT | Status: AC
  Administered 2019-12-22: 3 mL via RESPIRATORY_TRACT
  Filled 2019-12-22: qty 3

## 2019-12-22 MED ORDER — NITROGLYCERIN 0.4 MG SL SUBL: 0.4000 mg | SUBLINGUAL_TABLET | SUBLINGUAL | Status: DC | PRN

## 2019-12-22 MED ORDER — SODIUM CHLORIDE 0.9 % IV SOLN
500.0000 mg | INTRAVENOUS | Status: AC
  Administered 2019-12-22 – 2019-12-25 (×3): 500 mg via INTRAVENOUS
  Filled 2019-12-22 (×3): qty 500

## 2019-12-22 MED ORDER — ALBUTEROL SULFATE HFA 108 (90 BASE) MCG/ACT IN AERS
2.0000 | INHALATION_SPRAY | RESPIRATORY_TRACT | Status: DC | PRN
  Filled 2019-12-22: qty 6.7

## 2019-12-22 MED ORDER — MONTELUKAST SODIUM 10 MG PO TABS
10.0000 mg | ORAL_TABLET | Freq: Every day | ORAL | Status: DC
  Administered 2019-12-22 – 2019-12-26 (×5): 10 mg via ORAL
  Filled 2019-12-22 (×7): qty 1

## 2019-12-22 MED ORDER — FLUTICASONE FUROATE-VILANTEROL 100-25 MCG/INH IN AEPB
1.0000 | INHALATION_SPRAY | Freq: Every day | RESPIRATORY_TRACT | Status: DC
  Administered 2019-12-22 – 2019-12-26 (×5): 1 via RESPIRATORY_TRACT
  Filled 2019-12-22 (×2): qty 28

## 2019-12-22 MED ORDER — LISINOPRIL 5 MG PO TABS
2.5000 mg | ORAL_TABLET | Freq: Every day | ORAL | Status: DC
  Administered 2019-12-22: 2.5 mg via ORAL
  Filled 2019-12-22: qty 1

## 2019-12-22 MED ORDER — METHYLPREDNISOLONE SODIUM SUCC 125 MG IJ SOLR
125.0000 mg | Freq: Once | INTRAMUSCULAR | Status: AC
  Administered 2019-12-22: 125 mg via INTRAVENOUS
  Filled 2019-12-22: qty 2

## 2019-12-22 MED ORDER — SODIUM CHLORIDE 0.9 % IV SOLN
2.0000 g | Freq: Three times a day (TID) | INTRAVENOUS | Status: DC
  Administered 2019-12-22 – 2019-12-24 (×6): 2 g via INTRAVENOUS
  Filled 2019-12-22 (×8): qty 2

## 2019-12-22 MED ORDER — ONDANSETRON HCL 4 MG PO TABS: 4.0000 mg | ORAL_TABLET | Freq: Four times a day (QID) | ORAL | Status: DC | PRN

## 2019-12-22 MED ORDER — WARFARIN SODIUM 5 MG PO TABS: 5.0000 mg | ORAL_TABLET | Freq: Every day | ORAL | Status: DC

## 2019-12-22 MED ORDER — ONDANSETRON HCL 4 MG/2ML IJ SOLN: 4.0000 mg | Freq: Four times a day (QID) | INTRAMUSCULAR | Status: DC | PRN

## 2019-12-22 MED ORDER — ACETAMINOPHEN 500 MG PO TABS
1000.0000 mg | ORAL_TABLET | Freq: Once | ORAL | Status: AC
  Administered 2019-12-22: 1000 mg via ORAL
  Filled 2019-12-22: qty 2

## 2019-12-22 MED ORDER — WARFARIN - PHARMACIST DOSING INPATIENT
Freq: Every day | Status: DC
  Administered 2019-12-26: 1
  Filled 2019-12-22: qty 1

## 2019-12-22 MED ORDER — METOPROLOL SUCCINATE ER 50 MG PO TB24
50.0000 mg | ORAL_TABLET | Freq: Every day | ORAL | Status: DC
  Administered 2019-12-23 – 2019-12-28 (×6): 50 mg via ORAL
  Filled 2019-12-22 (×6): qty 1

## 2019-12-22 MED ORDER — DULOXETINE HCL 20 MG PO CPEP
20.0000 mg | ORAL_CAPSULE | Freq: Every day | ORAL | Status: DC
  Administered 2019-12-22 – 2019-12-28 (×7): 20 mg via ORAL
  Filled 2019-12-22 (×7): qty 1

## 2019-12-22 MED ORDER — IPRATROPIUM-ALBUTEROL 0.5-2.5 (3) MG/3ML IN SOLN: 3.0000 mL | RESPIRATORY_TRACT | Status: DC | PRN

## 2019-12-22 MED ORDER — TRAZODONE HCL 50 MG PO TABS
150.0000 mg | ORAL_TABLET | Freq: Every day | ORAL | Status: DC
  Administered 2019-12-22 – 2019-12-23 (×2): 150 mg via ORAL
  Filled 2019-12-22 (×2): qty 3

## 2019-12-22 MED ORDER — ATORVASTATIN CALCIUM 20 MG PO TABS
40.0000 mg | ORAL_TABLET | Freq: Every day | ORAL | Status: DC
  Administered 2019-12-22 – 2019-12-28 (×7): 40 mg via ORAL
  Filled 2019-12-22 (×7): qty 2

## 2019-12-22 MED ORDER — FLUTICASONE PROPIONATE 50 MCG/ACT NA SUSP
2.0000 | Freq: Two times a day (BID) | NASAL | Status: DC
  Administered 2019-12-22 – 2019-12-26 (×7): 2 via NASAL
  Filled 2019-12-22 (×2): qty 16

## 2019-12-22 MED ORDER — IOHEXOL 350 MG/ML SOLN
100.0000 mL | Freq: Once | INTRAVENOUS | Status: AC | PRN
  Administered 2019-12-22: 100 mL via INTRAVENOUS

## 2019-12-22 MED ORDER — LEVOTHYROXINE SODIUM 50 MCG PO TABS
50.0000 ug | ORAL_TABLET | Freq: Every day | ORAL | Status: DC
  Administered 2019-12-23 – 2019-12-28 (×6): 50 ug via ORAL
  Filled 2019-12-22 (×6): qty 1

## 2019-12-22 MED ORDER — FUROSEMIDE 10 MG/ML IJ SOLN
40.0000 mg | Freq: Once | INTRAMUSCULAR | Status: AC
  Administered 2019-12-22: 40 mg via INTRAVENOUS
  Filled 2019-12-22: qty 4

## 2019-12-22 NOTE — Progress Notes (Signed)
ANTICOAGULATION CONSULT NOTE - Initial Consult  Pharmacy Consult for Warfarin Dosing Indication: atrial fibrillation  No Known Allergies  Patient Measurements: Weight: (!) 186 kg (410 lb) Heparin Dosing Weight:    Vital Signs: Temp: 98.8 F (37.1 C) (11/06 1256) Temp Source: Axillary (11/06 1256) BP: 144/87 (11/06 1615) Pulse Rate: 91 (11/06 1615)  Labs: Recent Labs    12/22/19 1259  HGB 13.1  HCT 41.2  PLT 219  APTT 32  LABPROT 15.8*  INR 1.3*  CREATININE 0.82  TROPONINIHS 41*    Estimated Creatinine Clearance: 158.3 mL/min (by C-G formula based on SCr of 0.82 mg/dL).   Medical History: Past Medical History:  Diagnosis Date  . Allergy   . Anxiety   . Arthritis   . Asthma   . Brain damage   . Chronic pain of both knees 07/13/2018  . Clotting disorder (HCC)   . COPD (chronic obstructive pulmonary disease) (HCC)   . Depression   . GERD (gastroesophageal reflux disease)   . HFrEF (heart failure with reduced ejection fraction) (HCC)    a. 03/2018 Echo: EF 25-30%, diff HK. Mod LAE.  [name] History of DVT (deep vein thrombosis)   . History of pulmonary embolism    a. Chronic coumadin.  [name] Hypertension   . MI (myocardial infarction) (HCC)   . Morbid obesity (HCC)   . Neck pain 07/13/2018  . NICM (nonischemic cardiomyopathy) (HCC)    a. s/p Cath x 3 - reportedly nl cors. Last cath 2019 in [place]; b. a. 03/2018 Echo: EF 25-30%, diff HK.  [name] Persistent atrial fibrillation (HCC)    a. 03/2018 s/p DCCV; b. CHA2DS2VASc = 1-->Xarelto (later changed to warfarin); c. 05/2018 recurrent afib-->Amio initiated.  . Sleep apnea   . Sleep apnea     Medications:  Home Dose: Warfarin 5mg  daily  Assessment: Patient is a 59yo male admitted for shortness of breath. Patient has a history of afib and takes warfarin for stroke prevention. Pharmacy consulted for Warfarin dosing. INR on admission is subtherapeutic at 1.3.  Goal of Therapy:  INR 2-3 Monitor platelets by anticoagulation  protocol: Yes   Plan:  Will order Warfarin 7.5mg  times one dose tonight. Daily INR checks. Follow up on AM labs.  59yo, PharmD, BCPS 12/22/2019 6:01 PM

## 2019-12-22 NOTE — ED Provider Notes (Signed)
[facility] Emergency Department Provider Note   ____________________________________________    I have reviewed the triage vital signs and the nursing notes.   HISTORY  Chief Complaint Respiratory Distress  History somewhat limited due to respiratory distress   HPI [name] [name] is a 59 y.o. male with history of COPD, CHF with a EF of 25 to 30%, atrial fibrillation, sleep apnea who presents with shortness of breath. EMS reports patient called out for severe shortness of breath, started on CPAP immediately given hypoxia upon arrival. He denies chest pain but reports mild cough. No reports of fevers or chills.  Past Medical History:  Diagnosis Date  . Allergy   . Anxiety   . Arthritis   . Asthma   . Brain damage   . Chronic pain of both knees 07/13/2018  . Clotting disorder (HCC)   . COPD (chronic obstructive pulmonary disease) (HCC)   . Depression   . GERD (gastroesophageal reflux disease)   . HFrEF (heart failure with reduced ejection fraction) (HCC)    a. 03/2018 Echo: EF 25-30%, diff HK. Mod LAE.  [name] History of DVT (deep vein thrombosis)   . History of pulmonary embolism    a. Chronic coumadin.  [name] Hypertension   . MI (myocardial infarction) (HCC)   . Morbid obesity (HCC)   . Neck pain 07/13/2018  . NICM (nonischemic cardiomyopathy) (HCC)    a. s/p Cath x 3 - reportedly nl cors. Last cath 2019 in [place]; b. a. 03/2018 Echo: EF 25-30%, diff HK.  [name] Persistent atrial fibrillation (HCC)    a. 03/2018 s/p DCCV; b. CHA2DS2VASc = 1-->Xarelto (later changed to warfarin); c. 05/2018 recurrent afib-->Amio initiated.  . Sleep apnea   . Sleep apnea     Patient Active Problem List   Diagnosis Date Noted  . GAD (generalized anxiety disorder) 10/12/2019  . Alcohol withdrawal syndrome without complication (HCC)   . Lower extremity ulceration, unspecified laterality, with fat layer exposed (HCC) 08/09/2019  . Alcohol abuse   . Pressure injury of skin  06/26/2019  . Iron deficiency anemia 06/22/2019  . Depression 06/22/2019  . Elevated troponin 06/22/2019  . Left-sided Bell's palsy 05/08/2019  . Chronic intractable headache 05/03/2019  . Head injuries, initial encounter 05/03/2019  . Noncompliance by refusing service 05/03/2019  . Facial droop 05/02/2019  . Pharmacologic therapy 04/11/2019  . Disorder of skeletal system 04/11/2019  . Problems influencing health status 04/11/2019  . Chronic anticoagulation (warfarin  COUMADIN) 04/11/2019  . Elevated sed rate 04/11/2019  . Elevated C-reactive protein (CRP) 04/11/2019  . Elevated hemoglobin A1c 04/11/2019  . Hypoalbuminemia 04/11/2019  . Edema due to hypoalbuminemia 04/11/2019  . Elevated brain natriuretic peptide (BNP) level 04/11/2019  . Chronic hip pain (Right) 04/11/2019  . Osteoarthritis of hip (Right) 04/11/2019  . Degenerative joint disease of right hip 03/05/2019  . Hypothyroidism 03/04/2019  . Chronic venous stasis dermatitis of both lower extremities 03/04/2019  . Long term (current) use of anticoagulants 02/23/2019  . PE (pulmonary thromboembolism) (HCC) 01/21/2019  . HLD (hyperlipidemia) 01/21/2019  . CAD (coronary artery disease) 01/21/2019  . Noncompliance 01/09/2019  . Acquired thrombophilia (HCC) 11/28/2018  . Major depressive disorder, recurrent episode, in partial remission (HCC) 09/17/2018  . Chest pain 08/06/2018  . Personal history of DVT (deep vein thrombosis) 07/13/2018  . History of pulmonary embolism (on Coumadin) 07/13/2018  . Neck pain 07/13/2018  . Chronic low back pain (Bilateral)  w/ sciatica (Bilateral) 07/13/2018  . TBI (traumatic  brain injury) (HCC) 07/04/2018  . Abnormal thyroid blood test 06/27/2018  . Type 2 diabetes mellitus with other specified complication (HCC) 06/06/2018  . HTN (hypertension) 06/06/2018  . Chronic gout involving toe, unspecified cause, unspecified laterality 06/06/2018  . Morbid obesity with BMI of 60.0-69.9, adult  (HCC) 06/06/2018  . Chronic pain syndrome 06/06/2018  . Ulcers of both lower extremities, limited to breakdown of skin (HCC) 06/06/2018  . Gout 06/06/2018  . Persistent atrial fibrillation (HCC)   . Centrilobular emphysema (HCC) 02/02/2018  . OSA on CPAP 02/02/2018  . Lymphedema 02/02/2018  . Acute on chronic respiratory failure with hypoxia (HCC) 01/27/2018    Past Surgical History:  Procedure Laterality Date  . CARDIOVERSION N/A 03/24/2018   Procedure: CARDIOVERSION;  Surgeon: Antonieta Iba, MD;  Location: [facility] ORS;  Service: Cardiovascular;  Laterality: N/A;  . CARDIOVERSION N/A 08/08/2018   Procedure: CARDIOVERSION;  Surgeon: [name], MD;  Location: [facility] ORS;  Service: Cardiovascular;  Laterality: N/A;  . hearnia repair     X 3- total of two surgeries  . HERNIA REPAIR    . LEG SURGERY      Prior to Admission medications   Medication Sig Start Date End Date Taking? Authorizing Provider  albuterol (VENTOLIN HFA) 108 (90 Base) MCG/ACT inhaler Inhale 2 puffs into the lungs every 4 (four) hours as needed for wheezing or shortness of breath. 10/25/19   Karamalegos, Netta Neat, DO  allopurinol (ZYLOPRIM) 100 MG tablet TAKE 1 TABLET BY MOUTH ONCE DAILY Patient taking differently: Take 100 mg by mouth daily.  08/15/19   Flinchum, [name], FNP  atorvastatin (LIPITOR) 40 MG tablet TAKE 1 TABLET BY MOUTH ONCE DAILY Patient taking differently: Take 40 mg by mouth daily.  08/15/19   Flinchum, [name], FNP  DULoxetine (CYMBALTA) 20 MG capsule Take 1 capsule (20 mg total) by mouth daily. 10/25/19   Karamalegos, Netta Neat, DO  ferrous sulfate 325 (65 FE) MG tablet Take 1 tablet (325 mg total) by mouth 2 (two) times daily with a meal. 05/08/19   Flinchum, [name], FNP  fluticasone (FLONASE) 50 MCG/ACT nasal spray Place 2 sprays into both nostrils 2 (two) times daily.     [provider]  levofloxacin (LEVAQUIN) 500 MG tablet Take 1 tablet (500 mg total) by mouth daily. 5  days 12/14/19   [name], DO  levothyroxine (SYNTHROID) 50 MCG tablet Take 1 tablet (50 mcg total) by mouth daily before breakfast. 10/25/19   Karamalegos, Netta Neat, DO  lisinopril (ZESTRIL) 2.5 MG tablet Take 1 tablet (2.5 mg total) by mouth daily. 05/08/19   Flinchum, [name], FNP  loperamide (IMODIUM) 2 MG capsule Take 1 capsule (2 mg total) by mouth as needed for diarrhea or loose stools. 06/26/19   Arnetha Courser, MD  metoprolol succinate (TOPROL-XL) 50 MG 24 hr tablet Take 1 tablet (50 mg total) by mouth daily. Take with or immediately following a meal. 06/06/19   End, Cristal Deer, MD  montelukast (SINGULAIR) 10 MG tablet Take 1 tablet (10 mg total) by mouth at bedtime. 10/12/19   Karamalegos, Netta Neat, DO  naphazoline-glycerin (CLEAR EYES REDNESS) 0.012-0.2 % SOLN Place 1-2 drops into both eyes 4 (four) times daily as needed for eye irritation. 08/14/19   Alford Highland, MD  nitroGLYCERIN (NITROSTAT) 0.4 MG SL tablet Place 1 tablet under tongue every 5 minutes as needed for chest pain. (No more than 3 doses within 15 minutes) 03/08/19   Flinchum, [name], FNP  predniSONE (  DELTASONE) 50 MG tablet Take 1 tablet (50 mg total) by mouth daily with breakfast. 12/14/19   Althea Charon, Netta Neat, DO  thiamine 100 MG tablet Take 1 tablet (100 mg total) by mouth daily. 08/14/19   Alford Highland, MD  torsemide (DEMADEX) 20 MG tablet Take 1 tablet (20 mg total) by mouth 2 (two) times daily. Patient taking differently: Take 40 mg by mouth 2 (two) times daily.  08/15/19   Alford Highland, MD  traZODone (DESYREL) 150 MG tablet Take 1 tablet (150 mg total) by mouth at bedtime. 10/25/19   Karamalegos, Netta Neat, DO  warfarin (COUMADIN) 5 MG tablet Take 5 mg by mouth daily.  09/20/19   [provider]     Allergies Patient has no known allergies.  Family History  Problem Relation Age of Onset  . Heart failure Mother   . Lung cancer Mother   . Lung cancer Father   . Heart  attack Maternal Grandmother   . Heart attack Maternal Grandfather     Social History Social History   Tobacco Use  . Smoking status: Never Smoker  . Smokeless tobacco: Never Used  Vaping Use  . Vaping Use: Never used  Substance Use Topics  . Alcohol use: Yes  . Drug use: Never    Review of Systems limited by respiratory distress  Constitutional: No fever/chills Eyes: No visual changes.  ENT: No throat swelling Cardiovascular: Denies chest pain. Respiratory: As above Gastrointestinal: No abdominal pain. Mild nausea, no vomiting Genitourinary: Negative for dysuria. Musculoskeletal: No reported calf pain Skin: Negative for rash. Neurological: Negative for headaches  ____________________________________________   PHYSICAL EXAM:  VITAL SIGNS: ED Triage Vitals  Enc Vitals Group     BP 12/22/19 1256 (!) 154/99     Pulse Rate 12/22/19 1256 71     Resp 12/22/19 1300 (!) 23     Temp 12/22/19 1256 98.8 F (37.1 C)     Temp Source 12/22/19 1256 Axillary     SpO2 12/22/19 1256 98 %     Weight 12/22/19 1301 (!) 186 kg (410 lb)     Height --      Head Circumference --      Peak Flow --      Pain Score 12/22/19 1258 6     Pain Loc --      Pain Edu? --      Excl. in GC? --     Constitutional: Alert and oriented. CPAP in place Eyes: Conjunctivae are normal.  Head: Atraumatic. Nose: No congestion/rhinnorhea. Mouth/Throat: Mucous membranes are moist.   Neck:  Painless ROM Cardiovascular: Normal rate, regular rhythm. Grossly normal heart sounds.  Good peripheral circulation. Respiratory: Increased respiratory effort with tachypnea, scattered mild wheezes, bibasilar Rales Gastrointestinal: Soft and nontender. No distention.    Musculoskeletal: 2+ edema bilaterally.  Warm and well perfused Neurologic:  Normal speech and language. No gross focal neurologic deficits are appreciated.  Skin:  Skin is warm, dry and intact. No rash noted. Psychiatric: Mood and affect are  normal. Speech and behavior are normal.  ____________________________________________   LABS (all labs ordered are listed, but only abnormal results are displayed)  Labs Reviewed  CBC - Abnormal; Notable for the following components:      Result Value   RDW 16.4 (*)    All other components within normal limits  COMPREHENSIVE METABOLIC PANEL - Abnormal; Notable for the following components:   Chloride 96 (*)    Glucose, Bld 160 (*)  Calcium 7.9 (*)    Albumin 3.1 (*)    Total Bilirubin 1.5 (*)    All other components within normal limits  BRAIN NATRIURETIC PEPTIDE - Abnormal; Notable for the following components:   B Natriuretic Peptide 356.8 (*)    All other components within normal limits  TROPONIN I (HIGH SENSITIVITY) - Abnormal; Notable for the following components:   Troponin I (High Sensitivity) 41 (*)    All other components within normal limits  RESPIRATORY PANEL BY RT PCR (FLU A&B, COVID)  TROPONIN I (HIGH SENSITIVITY)   ____________________________________________  EKG  ED ECG REPORT I, Jene Every, the attending physician, personally viewed and interpreted this ECG.  Date: 12/22/2019  Rhythm: Atrial fibrillation QRS Axis: normal Intervals: normal ST/T Wave abnormalities: Abnormal Narrative Interpretation: no evidence of acute ischemia  ____________________________________________  RADIOLOGY  Chest x-ray reviewed by me, suspicious for pulmonary edema ____________________________________________   PROCEDURES  Procedure(s) performed: No  Procedures   Critical Care performed: yes  CRITICAL CARE Performed by: Jene Every   Total critical care time: 35 minutes  Critical care time was exclusive of separately billable procedures and treating other patients.  Critical care was necessary to treat or prevent imminent or life-threatening deterioration.  Critical care was time spent personally by me on the following activities: development of  treatment plan with patient and/or surrogate as well as nursing, discussions with consultants, evaluation of patient's response to treatment, examination of patient, obtaining history from patient or surrogate, ordering and performing treatments and interventions, ordering and review of laboratory studies, ordering and review of radiographic studies, pulse oximetry and re-evaluation of patient's condition.  ____________________________________________   INITIAL IMPRESSION / ASSESSMENT AND PLAN / ED COURSE  Pertinent labs & imaging results that were available during my care of the patient were reviewed by me and considered in my medical decision making (see chart for details).  Patient presents with severe shortness of breath, on CPAP upon arrival, transition to BiPAP. Suspicious for CHF versus COPD versus a combination of the above. Also on the differential is COVID-19, CAP  Pending labs, chest x-ray  Lab work notable for mild elevation in troponin of 41, compared to priors slightly elevated, BNP of 356.  No overt edema on chest x-ray, regardless given his history will give IV Lasix as well as DuoNeb's and Solu-Medrol  ----------------------------------------- 2:43 PM on 12/22/2019 -----------------------------------------  Attempted removal of BiPAP, patient unable to tolerate.  He will require admission for further management    ____________________________________________   FINAL CLINICAL IMPRESSION(S) / ED DIAGNOSES  Final diagnoses:  COPD exacerbation (HCC)  Acute on chronic congestive heart failure, unspecified heart failure type (HCC)  Shortness of breath        Note:  This document was prepared using Dragon voice recognition software and may include unintentional dictation errors.   Jene Every, MD 12/22/19 1444

## 2019-12-22 NOTE — ED Notes (Signed)
Pt is resting comfortably at this time. Pt called out to use restroom before this RN could make it to room to assist pt he had taken cardiac leads and bipap off and got out of bed to bedside toilet. Pt did have a walker a tbedside and did ambulate with steady gait with use of walker when assisted back to bed. Pt was tachypenic after ambulation but maintained a room air sat of 90%. Pt does take a little while to recover at rest.

## 2019-12-22 NOTE — ED Triage Notes (Signed)
Pt arrives via EMS with c/o of resp distress. HX of CHF.

## 2019-12-22 NOTE — ED Notes (Signed)
Pt placed back on bipap at this time by this RN per request. No further needs noted, pt placed on new male purwick, comfortable in bed.

## 2019-12-22 NOTE — H&P (Signed)
History and Physical   [name] [name] TML:[id number] DOB: 05/13/1960 DOA: 12/22/2019  PCP: [name] Cords, DO  Outpatient Specialists: [name] Mayotte III, PA-C ([facility], wound clinic)  Patient coming from: home   I have personally briefly reviewed patient's old medical records in [facility] EMR.  Chief Concern: Shortness of breath  HPI: [name] [name] is a 59 y.o. 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  He presented to the emergency department for chief concerns of acute onset of shortness of breath that started on 12/21/19.   He states that he felt short of breath when he laid down to try to sleep on evening of 12/21/2019. He reports associated feelings of feverish, chest pain, and clamppy.   [name] [name] reports he had tmax at home of 101. He endorses associated symptoms including cold sweats, aching belly pain that was described as diffused which started 2 days, one episode loose and watery bowl movement.  He further endorses compliance with CPAP use of greater than 6 hours/day, and compliance with home Coumadin.  ROS was negative for different headache/vision changes/changes to hearing/vomiting, changes to chronic back pain, SI/HI.  ROS positive for fever, chills,chest pain, nausea, diffused achy abdominal pain, worsening shortness of breath   Chest pain is persistent, sharp stabbing pain, 5/10, nausea, shortness of breath made better with mask,   Social history: Lives at home by himself, disabled, bedbound; denies tobacco use  ED Course: Discussed with ED provider requesting admission for shortness of  breath suspect COPD exacerbation.  Review of Systems: As per HPI otherwise 10 point review of systems negative.  Assessment/Plan  Principal Problem:   Acute on chronic respiratory failure with hypoxia (HCC) Active Problems:   OSA (obstructive sleep apnea)   HTN (hypertension)   Morbid obesity with BMI of 60.0-69.9, adult (HCC)   Chest pain   HLD (hyperlipidemia)   CAD (coronary artery disease)   Hypothyroidism   Degenerative joint disease of right hip   Chronic anticoagulation (warfarin  COUMADIN)   Shortness of breath   Atrial fibrillation, chronic (HCC)   Class 3 obesity with alveolar hypoventilation, serious comorbidity, and body mass index (BMI) of 60.0 to 69.9 in adult [facility])   Acute on chronic respiratory failure with hypoxia-responding to BiPAP therapy, etiology includes COPD exacerbation, heart failure exacerbation, PE -We will check D-dimer, PT INR, TSH, PTT -D-dimer elevated at 769.9 and given patient's bedbound state due to chronic bilateral knee, hip, and back pain, subtherapeutic INR, PE cannot be excluded -CTA of the chest ordered and ultrasound of bilateral lower extremity read as negative for PE and DVT, possible early infectious etiology in the left lung base -Consideration is given to patient's bedbound state, and high healthcare cost of Coumadin therapy necessitating frequent INR checks, would recommend transitioning to Eliquis versus Xarelto if primary provider deems this would be beneficial for patient -Ultrasound of bilateral lower extremity to assess for DVT -As needed DuoNebs ordered -Azithromycin for anti-inflammatory benefits and CAP -Cefepime 2 g IV for CAP and pseudomonas in cellulitis -Echo ordered  COPD-resumed home inhaler, duo nebs ordered  Chronic atrial fibrillation-patient endorses compliance with warfarin use -He states that no one has been managing his INR -We will check INR -Pharmacy  has been consulted for INR management with patient on  Coumadin  Obstructive sleep apnea in setting of 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 Allergy   . Anxiety   . Arthritis   . Asthma   . Brain damage   . Chronic pain of both knees 07/13/2018  . Clotting disorder (HCC)   . COPD (chronic obstructive pulmonary disease) (HCC)   . Depression   . GERD (gastroesophageal reflux disease)   . HFrEF (heart failure with reduced ejection fraction) (HCC)    a. 03/2018 Echo: EF 25-30%, diff HK. Mod LAE.  [name] History of DVT (deep vein thrombosis)   . History of pulmonary embolism    a. Chronic coumadin.  [name] Hypertension   . MI (myocardial infarction) (HCC)   . Morbid obesity (HCC)   . Neck pain 07/13/2018  . NICM (nonischemic cardiomyopathy) (HCC)    a. s/p Cath x 3 - reportedly nl cors. Last cath 2019 in [place]; b. a. 03/2018 Echo: EF 25-30%, diff HK.  [name] Persistent atrial fibrillation (HCC)    a. 03/2018 s/p DCCV; b. CHA2DS2VASc = 1-->Xarelto (later changed to  warfarin); c. 05/2018 recurrent afib-->Amio initiated.  . Sleep apnea   . Sleep apnea    Past Surgical History:  Procedure Laterality Date  . CARDIOVERSION N/A 03/24/2018   Procedure: CARDIOVERSION;  Surgeon: Antonieta Iba, MD;  Location: [facility] ORS;  Service: Cardiovascular;  Laterality: N/A;  . CARDIOVERSION N/A 08/08/2018   Procedure: CARDIOVERSION;  Surgeon: [name], MD;  Location: [facility] ORS;  Service: Cardiovascular;  Laterality: N/A;  . hearnia repair     X 3- total of two surgeries  . HERNIA REPAIR    . LEG SURGERY     Social History:  reports that he has never smoked. He has never used smokeless tobacco. He reports current alcohol use. He reports that he does not use drugs.  No Known Allergies Family History  Problem Relation Age of Onset  . Heart failure Mother   . Lung cancer Mother   . Lung cancer Father   . Heart attack Maternal Grandmother   . Heart attack Maternal Grandfather    Family history: Family history reviewed and positive for heart failure in mother.  Prior to Admission medications   Medication Sig Start Date End Date Taking? Authorizing Provider  albuterol (VENTOLIN HFA) 108 (90 Base) MCG/ACT inhaler Inhale 2 puffs into the lungs every 4 (four) hours as needed for wheezing or shortness of breath. 10/25/19   Karamalegos, Netta Neat, DO  allopurinol (ZYLOPRIM) 100 MG tablet TAKE 1 TABLET BY MOUTH ONCE DAILY Patient taking differently: Take 100 mg by mouth  daily.  08/15/19   Flinchum, [name], FNP  atorvastatin (LIPITOR) 40 MG tablet TAKE 1 TABLET BY MOUTH ONCE DAILY Patient taking differently: Take 40 mg by mouth daily.  08/15/19   Flinchum, [name], FNP  DULoxetine (CYMBALTA) 20 MG capsule Take 1 capsule (20 mg total) by mouth daily. 10/25/19   Karamalegos, Netta Neat, DO  ferrous sulfate 325 (65 FE) MG tablet Take 1 tablet (325 mg total) by mouth 2 (two) times daily with a meal. 05/08/19   Flinchum, [name], FNP  fluticasone (FLONASE) 50 MCG/ACT  nasal spray Place 2 sprays into both nostrils 2 (two) times daily.     [provider]  levofloxacin (LEVAQUIN) 500 MG tablet Take 1 tablet (500 mg total) by mouth daily. 5 days 12/14/19   [name] Cords, DO  levothyroxine (SYNTHROID) 50 MCG tablet Take 1 tablet (50 mcg total) by mouth daily before breakfast. 10/25/19   Karamalegos, Netta Neat, DO  lisinopril (ZESTRIL) 2.5 MG tablet Take 1 tablet (2.5 mg total) by mouth daily. 05/08/19   Flinchum, [name], FNP  loperamide (IMODIUM) 2 MG capsule Take 1 capsule (2 mg total) by mouth as needed for diarrhea or loose stools. 06/26/19   Arnetha Courser, MD  metoprolol succinate (TOPROL-XL) 50 MG 24 hr tablet Take 1 tablet (50 mg total) by mouth daily. Take with or immediately following a meal. 06/06/19   End, Cristal Deer, MD  montelukast (SINGULAIR) 10 MG tablet Take 1 tablet (10 mg total) by mouth at bedtime. 10/12/19   Karamalegos, Netta Neat, DO  naphazoline-glycerin (CLEAR EYES REDNESS) 0.012-0.2 % SOLN Place 1-2 drops into both eyes 4 (four) times daily as needed for eye irritation. 08/14/19   Alford Highland, MD  nitroGLYCERIN (NITROSTAT) 0.4 MG SL tablet Place 1 tablet under tongue every 5 minutes as needed for chest pain. (No more than 3 doses within 15 minutes) 03/08/19   Flinchum, [name], FNP  predniSONE (DELTASONE) 50 MG tablet Take 1 tablet (50 mg total) by mouth daily with breakfast. 12/14/19   Althea Charon, Netta Neat, DO  thiamine 100 MG tablet Take 1 tablet (100 mg total) by mouth daily. 08/14/19   Alford Highland, MD  torsemide (DEMADEX) 20 MG tablet Take 1 tablet (20 mg total) by mouth 2 (two) times daily. Patient taking differently: Take 40 mg by mouth 2 (two) times daily.  08/15/19   Alford Highland, MD  traZODone (DESYREL) 150 MG tablet Take 1 tablet (150 mg total) by mouth at bedtime. 10/25/19   Karamalegos, Netta Neat, DO  warfarin (COUMADIN) 5 MG tablet Take 5 mg by mouth daily.  09/20/19   [provider]     Physical Exam: Vitals:   12/22/19 2100 12/22/19 2115 12/22/19 2130 12/22/19 2145  BP:      Pulse: 83 73 85 100  Resp:      Temp:      TempSrc:      SpO2: 96% 97% 99% 98%  Weight:       Constitutional: appears mildly uncomfortable, no acute distress Eyes: PERRL, lids and conjunctivae normal ENMT: Mucous membranes are moist. Posterior pharynx clear of any exudate or lesions. Age-appropriate dentition. Hearing loss in left ear Neck: normal, supple, no masses, no thyromegaly Respiratory: clear to auscultation bilaterally, no wheezing, no crackles. Normal respiratory effort. No accessory muscle use.  Cardiovascular: Regular rate and rhythm, no murmurs / rubs / gallops. 2+ pitting lower extremity edema. 2+ pedal pulses. No carotid bruits.  Abdomen: morbidly obese abdomen, no  tenderness, no masses palpated, no hepatosplenomegaly. Bowel sounds positive.  Musculoskeletal: no clubbing / cyanosis. No joint deformity upper and lower extremities. Good ROM, no contractures, no atrophy. Normal muscle tone.  Skin: no rashes, ulcers. No induration. Lesion in right lower anterior and lateral leg with mild erythema Neurologic: CN 2-12 grossly intact. Sensation intact. Strength 5/5 in all 4.  Psychiatric: Normal judgment and insight. Alert and oriented x 3. Normal mood.   EKG: Independently reviewed, showing afibrillation with a rate of 91  Chest x-ray on Admission: Personally reviewed and I agree with radiologist reading as below.  US Venous Img Lower Bilateral (DVT)  Result Date: 12/22/2019 CLINICAL DATA:  Shortness of breath. EXAM: BILATERAL LOWER EXTREMITY VENOUS DOPPLER ULTRASOUND TECHNIQUE: Gray-scale sonography with compression, as well as color and duplex ultrasound, were performed to evaluate the deep venous system(s) from the level of the common femoral vein through the popliteal and proximal calf veins. COMPARISON:  Left lower extremity duplex 11/07/2019. FINDINGS: VENOUS Normal  compressibility of the common femoral, superficial femoral, and popliteal veins, as well as the visualized calf veins. Visualized portions of profunda femoral vein and great saphenous vein unremarkable. No filling defects to suggest DVT on grayscale or color Doppler imaging. Doppler waveforms show normal direction of venous flow, normal respiratory plasticity and response to augmentation. OTHER None. Limitations: Patient body habitus and medical condition (on BiPAP). IMPRESSION: No evidence of bilateral lower extremity DVT. Electronically Signed   By: Narda Rutherford M.D.   On: 12/22/2019 22:07   DG Chest Port 1 View  Result Date: 12/22/2019 CLINICAL DATA:  Respiratory distress. EXAM: PORTABLE CHEST 1 VIEW COMPARISON:  Chest x-ray dated November 07, 2019. FINDINGS: Unchanged cardiomegaly and pulmonary vascular congestion. No focal consolidation, pleural effusion, or pneumothorax. No acute osseous abnormality. IMPRESSION: 1. Similar cardiomegaly and pulmonary vascular congestion without overt edema. Electronically Signed   By: Obie Dredge M.D.   On: 12/22/2019 14:38   Labs on Admission: I have personally reviewed following labs  CBC: Recent Labs  Lab 12/22/19 1259  WBC 9.3  HGB 13.1  HCT 41.2  MCV 93.4  PLT 219   Basic Metabolic Panel: Recent Labs  Lab 12/22/19 1259  NA 137  K 3.7  CL 96*  CO2 30  GLUCOSE 160*  BUN 11  CREATININE 0.82  CALCIUM 7.9*   GFR: Estimated Creatinine Clearance: 158.3 mL/min (by C-G formula based on SCr of 0.82 mg/dL). Liver Function Tests: Recent Labs  Lab 12/22/19 1259  AST 35  ALT 30  ALKPHOS 75  BILITOT 1.5*  PROT 6.5  ALBUMIN 3.1*   No results for input(s): LIPASE, AMYLASE in the last 168 hours. No results for input(s): AMMONIA in the last 168 hours. Coagulation Profile: Recent Labs  Lab 12/22/19 1259  INR 1.3*   Cardiac Enzymes: No results for input(s): CKTOTAL, CKMB, CKMBINDEX, TROPONINI in the last 168 hours. BNP (last 3  results) No results for input(s): PROBNP in the last 8760 hours. HbA1C: No results for input(s): HGBA1C in the last 72 hours. CBG: No results for input(s): GLUCAP in the last 168 hours. Lipid Profile: No results for input(s): CHOL, HDL, LDLCALC, TRIG, CHOLHDL, LDLDIRECT in the last 72 hours. Thyroid Function Tests: No results for input(s): TSH, T4TOTAL, FREET4, T3FREE, THYROIDAB in the last 72 hours. Anemia Panel: No results for input(s): VITAMINB12, FOLATE, FERRITIN, TIBC, IRON, RETICCTPCT in the last 72 hours. Urine analysis:    Component Value Date/Time   COLORURINE YELLOW (A) 08/30/2019 8616  APPEARANCEUR HAZY (A) 08/30/2019 0325   APPEARANCEUR Hazy (A) 12/25/2018 1442   LABSPEC 1.014 08/30/2019 0325   PHURINE 7.0 08/30/2019 0325   GLUCOSEU NEGATIVE 08/30/2019 0325   HGBUR MODERATE (A) 08/30/2019 0325   BILIRUBINUR NEGATIVE 08/30/2019 0325   BILIRUBINUR Negative 12/25/2018 1442   KETONESUR NEGATIVE 08/30/2019 0325   PROTEINUR 30 (A) 08/30/2019 0325   NITRITE NEGATIVE 08/30/2019 0325   LEUKOCYTESUR SMALL (A) 08/30/2019 0325    Tamlyn Sides N Beatriz Settles D.O. [facility]  If 12AM-7AM, please contact overnight-coverage provider If 7AM-7PM, please contact day coverage provider [email or web address]  12/22/2019, 10:21 PM

## 2019-12-22 NOTE — ED Notes (Signed)
Pt presents to ED via EMS in resp distress and on c-pap placed by EMS. EMS states fire who were the first responders reported a RA  oxygen saturation of 60's%. Pt presents with labored breathing and states a HX of CHF. Pt is morbidly obese and does have 3+pitting edema to lower extremities, pt also appears to have swelling to his face as well. Pt states he started becoming SOB last night. Pt denies fevers or chills or exposure to COVID.Pt denies N/V/D. Pt is currently on bi-pap with 100% O2 maintaining a O2 sat of 95% or higher, pt appears less labored with bi-pap in place. Pt does have some rhonchi throughout but is mostly diminished throughout. Pt is A&Ox4 and states he does take a diuretic but has not taken it today ut states taking as RX'ed prior.

## 2019-12-22 NOTE — ED Notes (Signed)
Patient no longer wants to wear BiPAP, I placed patient on 6L nasal cannula and he is tolerating well at 94-96% with no distress. Jon Billings NP paged to make aware.

## 2019-12-22 NOTE — ED Notes (Signed)
Pt given food tray at this time, bipap removed and patient placed on 4 L for while eating. No further needs noted, pt given remote and will continue to monitor.

## 2019-12-23 ENCOUNTER — Observation Stay
Admit: 2019-12-23 | Discharge: 2019-12-23 | Disposition: A | Discharge status: Home / Self Care | Payer: Medicaid Other | Attending: Internal Medicine | Admitting: Internal Medicine

## 2019-12-23 DIAGNOSIS — I110 Hypertensive heart disease with heart failure: Secondary | ICD-10-CM | POA: Diagnosis not present

## 2019-12-23 DIAGNOSIS — I4891 Unspecified atrial fibrillation: Secondary | ICD-10-CM | POA: Diagnosis not present

## 2019-12-23 DIAGNOSIS — I509 Heart failure, unspecified: Secondary | ICD-10-CM | POA: Diagnosis not present

## 2019-12-23 DIAGNOSIS — I517 Cardiomegaly: Secondary | ICD-10-CM | POA: Diagnosis not present

## 2019-12-23 DIAGNOSIS — I2510 Atherosclerotic heart disease of native coronary artery without angina pectoris: Secondary | ICD-10-CM | POA: Diagnosis not present

## 2019-12-23 DIAGNOSIS — I482 Chronic atrial fibrillation, unspecified: Secondary | ICD-10-CM | POA: Diagnosis not present

## 2019-12-23 DIAGNOSIS — Z6841 Body Mass Index (BMI) 40.0 and over, adult: Secondary | ICD-10-CM | POA: Diagnosis not present

## 2019-12-23 DIAGNOSIS — H9192 Unspecified hearing loss, left ear: Secondary | ICD-10-CM | POA: Diagnosis not present

## 2019-12-23 DIAGNOSIS — I890 Lymphedema, not elsewhere classified: Secondary | ICD-10-CM | POA: Diagnosis not present

## 2019-12-23 DIAGNOSIS — M170 Bilateral primary osteoarthritis of knee: Secondary | ICD-10-CM | POA: Diagnosis not present

## 2019-12-23 DIAGNOSIS — Z7901 Long term (current) use of anticoagulants: Secondary | ICD-10-CM | POA: Diagnosis not present

## 2019-12-23 DIAGNOSIS — E039 Hypothyroidism, unspecified: Secondary | ICD-10-CM | POA: Diagnosis not present

## 2019-12-23 DIAGNOSIS — Z79890 Hormone replacement therapy (postmenopausal): Secondary | ICD-10-CM | POA: Diagnosis not present

## 2019-12-23 DIAGNOSIS — A4102 Sepsis due to Methicillin resistant Staphylococcus aureus: Secondary | ICD-10-CM | POA: Diagnosis not present

## 2019-12-23 DIAGNOSIS — M167 Other unilateral secondary osteoarthritis of hip: Secondary | ICD-10-CM | POA: Diagnosis not present

## 2019-12-23 DIAGNOSIS — X58XXXA Exposure to other specified factors, initial encounter: Secondary | ICD-10-CM | POA: Diagnosis present

## 2019-12-23 DIAGNOSIS — J440 Chronic obstructive pulmonary disease with acute lower respiratory infection: Secondary | ICD-10-CM | POA: Diagnosis not present

## 2019-12-23 DIAGNOSIS — Z20822 Contact with and (suspected) exposure to covid-19: Secondary | ICD-10-CM | POA: Diagnosis not present

## 2019-12-23 DIAGNOSIS — L03115 Cellulitis of right lower limb: Secondary | ICD-10-CM | POA: Diagnosis not present

## 2019-12-23 DIAGNOSIS — I5032 Chronic diastolic (congestive) heart failure: Secondary | ICD-10-CM | POA: Diagnosis not present

## 2019-12-23 DIAGNOSIS — R0602 Shortness of breath: Secondary | ICD-10-CM

## 2019-12-23 DIAGNOSIS — G4733 Obstructive sleep apnea (adult) (pediatric): Secondary | ICD-10-CM | POA: Diagnosis not present

## 2019-12-23 DIAGNOSIS — J441 Chronic obstructive pulmonary disease with (acute) exacerbation: Secondary | ICD-10-CM | POA: Diagnosis not present

## 2019-12-23 DIAGNOSIS — G43909 Migraine, unspecified, not intractable, without status migrainosus: Secondary | ICD-10-CM | POA: Diagnosis not present

## 2019-12-23 DIAGNOSIS — Z7401 Bed confinement status: Secondary | ICD-10-CM | POA: Diagnosis not present

## 2019-12-23 DIAGNOSIS — J9601 Acute respiratory failure with hypoxia: Secondary | ICD-10-CM | POA: Diagnosis not present

## 2019-12-23 DIAGNOSIS — J189 Pneumonia, unspecified organism: Secondary | ICD-10-CM | POA: Diagnosis not present

## 2019-12-23 DIAGNOSIS — E785 Hyperlipidemia, unspecified: Secondary | ICD-10-CM | POA: Diagnosis not present

## 2019-12-23 DIAGNOSIS — J811 Chronic pulmonary edema: Secondary | ICD-10-CM | POA: Diagnosis not present

## 2019-12-23 DIAGNOSIS — A419 Sepsis, unspecified organism: Secondary | ICD-10-CM | POA: Diagnosis not present

## 2019-12-23 DIAGNOSIS — F32A Depression, unspecified: Secondary | ICD-10-CM | POA: Diagnosis not present

## 2019-12-23 DIAGNOSIS — J9621 Acute and chronic respiratory failure with hypoxia: Secondary | ICD-10-CM | POA: Diagnosis not present

## 2019-12-23 DIAGNOSIS — E662 Morbid (severe) obesity with alveolar hypoventilation: Secondary | ICD-10-CM | POA: Diagnosis not present

## 2019-12-23 DIAGNOSIS — F419 Anxiety disorder, unspecified: Secondary | ICD-10-CM | POA: Diagnosis not present

## 2019-12-23 LAB — CBC
HCT: 40.5 % (ref 39.0–52.0)
Hemoglobin: 13.2 g/dL (ref 13.0–17.0)
MCH: 29.8 pg (ref 26.0–34.0)
MCHC: 32.6 g/dL (ref 30.0–36.0)
MCV: 91.4 fL (ref 80.0–100.0)
Platelets: 204 10*3/uL (ref 150–400)
RBC: 4.43 MIL/uL (ref 4.22–5.81)
RDW: 16.3 % — ABNORMAL HIGH (ref 11.5–15.5)
WBC: 7.2 10*3/uL (ref 4.0–10.5)
nRBC: 0 % (ref 0.0–0.2)

## 2019-12-23 LAB — BASIC METABOLIC PANEL
Anion gap: 8 (ref 5–15)
BUN: 15 mg/dL (ref 6–20)
CO2: 29 mmol/L (ref 22–32)
Calcium: 8.5 mg/dL — ABNORMAL LOW (ref 8.9–10.3)
Chloride: 98 mmol/L (ref 98–111)
Creatinine, Ser: 0.87 mg/dL (ref 0.61–1.24)
GFR, Estimated: >60 mL/min (ref >60)
Glucose, Bld: 171 mg/dL — ABNORMAL HIGH (ref 70–99)
Potassium: 4.2 mmol/L (ref 3.5–5.1)
Sodium: 135 mmol/L (ref 135–145)

## 2019-12-23 LAB — TROPONIN I (HIGH SENSITIVITY): Troponin I (High Sensitivity): 19 ng/L — ABNORMAL HIGH (ref <18)

## 2019-12-23 LAB — TSH: TSH: 1.601 u[IU]/mL (ref 0.350–4.500)

## 2019-12-23 LAB — PROTIME-INR
INR: 1.2 (ref 0.8–1.2)
Prothrombin Time: 14.8 seconds (ref 11.4–15.2)

## 2019-12-23 MED ORDER — IPRATROPIUM-ALBUTEROL 0.5-2.5 (3) MG/3ML IN SOLN
3.0000 mL | Freq: Four times a day (QID) | RESPIRATORY_TRACT | Status: DC
  Administered 2019-12-23: 3 mL via RESPIRATORY_TRACT

## 2019-12-23 MED ORDER — PERFLUTREN LIPID MICROSPHERE
1.0000 mL | INTRAVENOUS | Status: AC | PRN
  Administered 2019-12-23: 4 mL via INTRAVENOUS
  Filled 2019-12-23: qty 10

## 2019-12-23 MED ORDER — TORSEMIDE 20 MG PO TABS
40.0000 mg | ORAL_TABLET | Freq: Two times a day (BID) | ORAL | Status: DC
  Administered 2019-12-23 – 2019-12-27 (×9): 40 mg via ORAL
  Filled 2019-12-23 (×11): qty 2

## 2019-12-23 MED ORDER — WARFARIN SODIUM 7.5 MG PO TABS
7.5000 mg | ORAL_TABLET | Freq: Once | ORAL | Status: AC
  Administered 2019-12-23: 7.5 mg via ORAL
  Filled 2019-12-23: qty 1

## 2019-12-23 MED ORDER — IPRATROPIUM-ALBUTEROL 0.5-2.5 (3) MG/3ML IN SOLN
3.0000 mL | Freq: Three times a day (TID) | RESPIRATORY_TRACT | Status: DC
  Administered 2019-12-23 – 2019-12-28 (×15): 3 mL via RESPIRATORY_TRACT
  Filled 2019-12-23 (×16): qty 3

## 2019-12-23 MED ORDER — DM-GUAIFENESIN ER 30-600 MG PO TB12
1.0000 | ORAL_TABLET | Freq: Two times a day (BID) | ORAL | Status: DC
  Administered 2019-12-23 – 2019-12-28 (×10): 1 via ORAL
  Filled 2019-12-23 (×11): qty 1

## 2019-12-23 MED ORDER — LISINOPRIL 10 MG PO TABS
10.0000 mg | ORAL_TABLET | Freq: Every day | ORAL | Status: DC
  Administered 2019-12-23 – 2019-12-27 (×5): 10 mg via ORAL
  Filled 2019-12-23 (×5): qty 1

## 2019-12-23 MED ORDER — SODIUM CHLORIDE 0.9 % IV SOLN: 1.0000 g | INTRAVENOUS | Status: DC

## 2019-12-23 MED ORDER — NAPHAZOLINE-GLYCERIN 0.012-0.2 % OP SOLN
1.0000 [drp] | Freq: Four times a day (QID) | OPHTHALMIC | Status: DC | PRN
  Filled 2019-12-23 (×2): qty 15

## 2019-12-23 MED ORDER — HYDRALAZINE HCL 25 MG PO TABS: 25.0000 mg | ORAL_TABLET | Freq: Three times a day (TID) | ORAL | Status: DC | PRN

## 2019-12-23 MED ORDER — ALBUTEROL SULFATE (2.5 MG/3ML) 0.083% IN NEBU
2.5000 mg | INHALATION_SOLUTION | RESPIRATORY_TRACT | Status: DC | PRN
  Filled 2019-12-23: qty 3

## 2019-12-23 NOTE — ED Notes (Signed)
Patient laying in bed trying to sleep. Bi-pap on. No signs of distress. Will continue to monitor.

## 2019-12-23 NOTE — ED Notes (Signed)
Pt awake, requesting to be placed back on nasal cannula, refusing BiPAP. Pt placed on 6L nasal cannula and O2 saturation 95%.

## 2019-12-23 NOTE — ED Notes (Signed)
Floor unable to take patient due to staffing. Charge nurse aware.

## 2019-12-23 NOTE — ED Notes (Signed)
Attempted to give report. Charge nurse not available at this time per Diplomatic Services operational officer.

## 2019-12-23 NOTE — ED Notes (Signed)
Patient sitting up in chair eating lunch on 4L [place]. No signs of distress. Will continue to monitor.

## 2019-12-23 NOTE — Progress Notes (Signed)
ANTICOAGULATION CONSULT NOTE - Initial Consult  Pharmacy Consult for Warfarin Dosing Indication: atrial fibrillation  No Known Allergies  Patient Measurements: Weight: (!) 186 kg (410 lb)   Vital Signs: BP: 161/100 (11/07 0600) Pulse Rate: 96 (11/07 0600)  Labs: Recent Labs    12/22/19 1259 12/23/19 0537  HGB 13.1 13.2  HCT 41.2 40.5  PLT 219 204  APTT 32  --   LABPROT 15.8* 14.8  INR 1.3* 1.2  CREATININE 0.82 0.87  TROPONINIHS 41* 19*    Estimated Creatinine Clearance: 149.2 mL/min (by C-G formula based on SCr of 0.87 mg/dL).   Medical History: Past Medical History:  Diagnosis Date  . Allergy   . Anxiety   . Arthritis   . Asthma   . Brain damage   . Chronic pain of both knees 07/13/2018  . Clotting disorder (HCC)   . COPD (chronic obstructive pulmonary disease) (HCC)   . Depression   . GERD (gastroesophageal reflux disease)   . HFrEF (heart failure with reduced ejection fraction) (HCC)    a. 03/2018 Echo: EF 25-30%, diff HK. Mod LAE.  [name] History of DVT (deep vein thrombosis)   . History of pulmonary embolism    a. Chronic coumadin.  [name] Hypertension   . MI (myocardial infarction) (HCC)   . Morbid obesity (HCC)   . Neck pain 07/13/2018  . NICM (nonischemic cardiomyopathy) (HCC)    a. s/p Cath x 3 - reportedly nl cors. Last cath 2019 in [place]; b. a. 03/2018 Echo: EF 25-30%, diff HK.  [name] Persistent atrial fibrillation (HCC)    a. 03/2018 s/p DCCV; b. CHA2DS2VASc = 1-->Xarelto (later changed to warfarin); c. 05/2018 recurrent afib-->Amio initiated.  . Sleep apnea   . Sleep apnea     Medications:  Home Dose: Warfarin 5mg  daily  Assessment: Patient is a 59yo male admitted for shortness of breath. Patient has a history of afib and takes warfarin for stroke prevention. Pharmacy consulted for Warfarin dosing. INR on admission is subtherapeutic at 1.3.  Date INR Warfarin Dose  11/6 1.3 7.5mg   11/7 1.2      Goal of Therapy:  INR 2-3 Monitor platelets by  anticoagulation protocol: Yes   Plan:  Will order Warfarin 7.5mg  times one dose again tonight.  (50% greater than home dose)  Daily INR checks. Follow up on AM labs.  13/7, PharmD, BCPS Clinical Pharmacist 12/23/2019 9:16 AM

## 2019-12-23 NOTE — Progress Notes (Addendum)
PROGRESS NOTE    [name] [name]   KPV:[id number]  DOB: Jul 18, 1960  PCP: [name], DO    DOA: 12/22/2019 LOS: 0   Brief Narrative   [name] [name] is a 59 y.o. 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  Presented to the ED on 12/22/19 with progressive shortness of breath since day before.  Reported associated fever/chills, clammy skin, general malaise. Reported fever of 101 F at home.   Placed on BiPAP in the ED.  Evaluation revealed left lower lobe pneumonia seen on CTA chest (acute PE ruled out) with concurrent COPD exacerbation, and RLE cellulitis.     Assessment & Plan   Principal Problem:   Acute on chronic respiratory failure with hypoxia (HCC) Active Problems:   OSA (obstructive sleep apnea)   HTN (hypertension)   Morbid obesity with BMI of 60.0-69.9, adult (HCC)   Chest pain   HLD (hyperlipidemia)   CAD (coronary artery disease)   Hypothyroidism   Degenerative joint disease of right hip   Chronic anticoagulation (warfarin   COUMADIN)   Shortness of breath   Atrial fibrillation, chronic (HCC)   Class 3 obesity with alveolar hypoventilation, serious comorbidity, and body mass index (BMI) of 60.0 to 69.9 in adult [facility])   Acute on chronic respiratory failure with hypoxia due to Community-acquired Pneumonia - present on admission.  Presented in respiratory distress requiring BiPAP.  CTA ruled out PE but showed LLL infiltrate consistent with pneumonia.  Chart review shows treated with Prednisone and Levaquin in mid-Oct for sinusitis and mild COPD exacerbation.   --Continue Cefepime and Zithromax --Scheduled Mucinex --IS and flutter --Tylenol PRN fever --Supplemental O2 as needed, maintain O2 sat > 88%, wean  as tolerated --follow blood culture --order sputum culture  Sepsis - due to CA-PNA and RLE cellulitis.  Meets SIRS criteria with tachycardia, tachypnea, with symptoms and imaging consistent with pneumonia.  No evidence of organ dysfunction.  Mgmt as above.    Right lower extremity cellulitis - POA. Pt has chronic lymphedema and presented with cellulitis. On Cefepime as above.  Shortness of breath - due to PNA, ?COPD.  Mgmt as above.  Improving.  COPD - seems stable, no wheezing on exam and good aeration.  Given IV steroids in the ED, but not wheezing on exam today, will defer additional steroids as acute respiratory illness likely due to PNA. Monitor closely for wheezing, and consider resume steroids.   Resumed on home regimen.  Breo was added on admission.  Scheduled Duonebs.  Chronic diastolic CHF - seems compensated with baseline edema per patient.   --Continue metoprolol --Resume torsemide with monitoring of renal function and electrolytes  Chest pain - POA, Resolved.  Had very mild troponin elevation on presentation 41>>19.   Chest pain has resolved, no acute ischemic ECG changes.  Likely secondary to PNA.  Monitor.  Coronary artery disease - continue ASA, statin  Essential Hypertension - continue lisinopril, Toprol-XL  OSA (obstructive sleep apnea) - CPAP ordered  Chronic anticoagulation (warfarin   COUMADIN) - for hx of PE's.  Pharmacy consulted. Continue warfarin. Reported on admission, has not been followed recently for [facility].  Atrial fibrillation, chronic - rate controlled.  Continue warfarin, Toprol-XL  Hyperlipidemia - continue Lipitor  Gout - not acutely flared. Continue allopurinol.  Insomnia - trazodone  Hypothyroidism - levothyroxine  Morbid Obesity: Body mass index is 62.34 kg/m.  Complicates overall care and prognosis.  Hx of TBI - age 7, with resulting left-sided deafness.    Depression/Anxiety - Cymbalta   DVT prophylaxis: Place TED  hose Start: 12/22/19 1628   Diet:  Diet Orders (From admission, onward)    Start     Ordered   12/22/19 1746  Diet 2 gram sodium Room service appropriate? Yes; Fluid consistency: Thin  Diet effective now       Question Answer Comment  Room service appropriate? Yes   Fluid consistency: Thin      12/22/19 1747            Code Status: Partial Code    Subjective 12/23/19    Pt seen in ED on hold for a bed.  Off BiPAP, says feeling a lot better today.  Still get SOB on exertion or talking.  Has been having fever/chills, general malaise.  Continues to have hot/cold spells this morning.  Does not feel tight or wheezy.  No chest pain.     Disposition Plan & Communication   Status is: Inpatient  Remains inpatient appropriate because:IV treatments appropriate due to intensity of illness or inability to take PO, oxygen requirements above baseline, on IV antibiotics   Dispo: The patient is from: Home              Anticipated d/c is to: Home              Anticipated d/c date is: 2 days              Patient currently is not medically stable to d/c.    Family Communication: none at bedside, will attempt to call    Consults, Procedures, Significant Events   Consultants:   none  Procedures:   none  Antimicrobials:  Anti-infectives (From admission, onward)   Start     Dose/Rate Route Frequency Ordered Stop   12/23/19 1000  cefTRIAXone (ROCEPHIN) 1 g in sodium chloride 0.9 % 100 mL IVPB  Status:  Discontinued        1 g 200 mL/hr over 30 Minutes Intravenous Every 24 hours 12/23/19 0001 12/23/19 0002   12/22/19 2200  azithromycin (ZITHROMAX) 500 mg in sodium chloride 0.9 % 250 mL IVPB        500 mg 250 mL/hr over 60 Minutes Intravenous Every 24 hours 12/22/19 2115 12/25/19 2159   12/22/19 1645  ceFEPIme (MAXIPIME) 2 g in sodium chloride 0.9 % 100 mL IVPB        2 g 200 mL/hr over 30 Minutes Intravenous Every 8 hours 12/22/19 1631 12/27/19 1359       Objective   Vitals:    12/23/19 0911 12/23/19 1200 12/23/19 1348 12/23/19 1349  BP: (!) 158/99 130/70  (!) 179/84  Pulse: 70 81 92 92  Resp: (!) 23 20  (!) 22  Temp:      TempSrc:      SpO2: 96% 95% 98% 98%  Weight:        Intake/Output Summary (Last 24 hours) at 12/23/2019 1457 Last data filed at 12/23/2019 0544 Gross per 24 hour  Intake --  Output 1250 ml  Net -1250 ml   Filed Weights   12/22/19 1301  Weight: (!) 186 kg    Physical Exam:  General exam: awake, alert, no acute distress, up in chair, morbidly obese HEENT: conjunctival injection, moist mucus membranes, hearing grossly normal  Respiratory system:  CTAB with faint rhonchi and diminished left base, no wheezes, mildly increased respiratory effort with conversation. Cardiovascular system: normal S1/S2, RRR, distant heart sounds due to body habitus Central nervous system: A&O x3. no gross focal neurologic deficits, normal speech Extremities: moves all, LE's wrapped, proximal edema noted Psychiatry: normal mood, congruent affect, judgement and insight appear normal  Labs   Data Reviewed: I have personally reviewed following labs and imaging studies  CBC: Recent Labs  Lab 12/22/19 1259 12/23/19 0537  WBC 9.3 7.2  HGB 13.1 13.2  HCT 41.2 40.5  MCV 93.4 91.4  PLT 219 204   Basic Metabolic Panel: Recent Labs  Lab 12/22/19 1259 12/23/19 0537  NA 137 135  K 3.7 4.2  CL 96* 98  CO2 30 29  GLUCOSE 160* 171*  BUN 11 15  CREATININE 0.82 0.87  CALCIUM 7.9* 8.5*   GFR: Estimated Creatinine Clearance: 149.2 mL/min (by C-G formula based on SCr of 0.87 mg/dL). Liver Function Tests: Recent Labs  Lab 12/22/19 1259  AST 35  ALT 30  ALKPHOS 75  BILITOT 1.5*  PROT 6.5  ALBUMIN 3.1*   No results for input(s): LIPASE, AMYLASE in the last 168 hours. No results for input(s): AMMONIA in the last 168 hours. Coagulation Profile: Recent Labs  Lab 12/22/19 1259 12/23/19 0537  INR 1.3* 1.2   Cardiac Enzymes: No results for  input(s): CKTOTAL, CKMB, CKMBINDEX, TROPONINI in the last 168 hours. BNP (last 3 results) No results for input(s): PROBNP in the last 8760 hours. HbA1C: No results for input(s): HGBA1C in the last 72 hours. CBG: No results for input(s): GLUCAP in the last 168 hours. Lipid Profile: No results for input(s): CHOL, HDL, LDLCALC, TRIG, CHOLHDL, LDLDIRECT in the last 72 hours. Thyroid Function Tests: Recent Labs    12/22/19 0537  TSH 1.601   Anemia Panel: No results for input(s): VITAMINB12, FOLATE, FERRITIN, TIBC, IRON, RETICCTPCT in the last 72 hours. Sepsis Labs: No results for input(s): PROCALCITON, LATICACIDVEN in the last 168 hours.  Recent Results (from the past 240 hour(s))  Aerobic Culture (superficial specimen)     Status: None (Preliminary result)   Collection Time: 12/21/19  8:58 AM   Specimen: Wound  Result Value Ref Range Status   Specimen Description   Final    WOUND Performed at [facility], [address], [place], [place] [address]    Special Requests RIGHT LEG  Final   Gram Stain   Final    NO WBC SEEN ABUNDANT GRAM POSITIVE COCCI MODERATE GRAM POSITIVE RODS RARE GRAM NEGATIVE RODS Performed at [facility], [address] [place], [place] [address]    Culture ABUNDANT STAPHYLOCOCCUS AUREUS  Final   Report Status PENDING  Incomplete  Respiratory Panel by RT PCR (Flu A&B, Covid) - Nasopharyngeal Swab     Status: None   Collection Time: 12/22/19  1:15 PM   Specimen: Nasopharyngeal Swab  Result Value Ref Range Status   SARS Coronavirus 2 by RT PCR NEGATIVE NEGATIVE Final    Comment: (NOTE) SARS-CoV-2 target nucleic acids are NOT DETECTED.  The SARS-CoV-2 RNA is generally detectable in upper respiratoy specimens during the acute phase of infection. The lowest concentration of SARS-CoV-2 viral copies this assay can detect is 131 copies/mL. A negative result does not preclude SARS-Cov-2 infection and should not be used as the sole basis for  treatment or other patient management decisions. A negative result may occur with  improper specimen collection/handling, submission of specimen other  than nasopharyngeal swab, presence of viral mutation(s) within the areas targeted by this assay, and inadequate number of viral copies (<131 copies/mL). A negative result must be combined with clinical observations, patient history, and epidemiological information. The expected result is Negative.  Fact Sheet for Patients:  https://www.moore.com/  Fact Sheet for Healthcare Providers:  https://www.young.biz/  This test is no t yet approved or cleared by the [place] FDA and  has been authorized for detection and/or diagnosis of SARS-CoV-2 by FDA under an Emergency Use Authorization (EUA). This EUA will remain  in effect (meaning this test can be used) for the duration of the COVID-19 declaration under Section 564(b)(1) of the Act, 21 U.S.C. section 360bbb-3(b)(1), unless the authorization is terminated or revoked sooner.     Influenza A by PCR NEGATIVE NEGATIVE Final   Influenza B by PCR NEGATIVE NEGATIVE Final    Comment: (NOTE) The Xpert Xpress SARS-CoV-2/FLU/RSV assay is intended as an aid in  the diagnosis of influenza from Nasopharyngeal swab specimens and  should not be used as a sole basis for treatment. Nasal washings and  aspirates are unacceptable for Xpert Xpress SARS-CoV-2/FLU/RSV  testing.  Fact Sheet for Patients: https://www.moore.com/  Fact Sheet for Healthcare Providers: https://www.young.biz/  This test is not yet approved or cleared by the [place] FDA and  has been authorized for detection and/or diagnosis of SARS-CoV-2 by  FDA under an Emergency Use Authorization (EUA). This EUA will remain  in effect (meaning this test can be used) for the duration of the  Covid-19 declaration under Section 564(b)(1) of the Act, 21   U.S.C. section 360bbb-3(b)(1), unless the authorization is  terminated or revoked. Performed at [facility], [address] [place], [place] [address]       Imaging Studies   CT ANGIO CHEST PE W OR WO CONTRAST  Result Date: 12/22/2019 CLINICAL DATA:  Chest pain and shortness of breath EXAM: CT ANGIOGRAPHY CHEST WITH CONTRAST TECHNIQUE: Multidetector CT imaging of the chest was performed using the standard protocol during bolus administration of intravenous contrast. Multiplanar CT image reconstructions and MIPs were obtained to evaluate the vascular anatomy. CONTRAST:  OMNIPAQUE IOHEXOL 350 MG/ML SOLN COMPARISON:  Radiograph same day FINDINGS: Cardiovascular: There is slightly suboptimal opacification of the main pulmonary artery, however no central or proximal segmental pulmonary embolism. Mild cardiomegaly is seen. No pericardial effusion or thickening. No evidence right heart strain. There is a common origin of the left carotid artery off the brachiocephalic trunk. No stenosis is seen. There is scattered aortic atherosclerosis. There is coronary artery calcifications. Mediastinum/Nodes: No hilar, mediastinal, or axillary adenopathy. Thyroid gland, trachea, and esophagus demonstrate no significant findings. Lungs/Pleura: Minimal bibasilar dependent atelectasis. There is patchy airspace opacity seen at the posterior left lung base with a trace left pleural effusion. No pleural effusion or pneumothorax. No airspace consolidation. Upper Abdomen: No acute abnormalities present in the visualized portions of the upper abdomen. There is elevation of the right hemidiaphragm. Musculoskeletal: No chest wall abnormality. No acute or significant osseous findings. Endplate reactive changes are noted within the mid to lower thoracic spine. Review of the MIP images confirms the above findings. IMPRESSION: Slightly suboptimal opacification of the main pulmonary artery, however no central or  segmental pulmonary embolism. Mild patchy airspace opacity at the posterior left lung base with a trace pleural effusion which could be due to atelectasis and/or early infectious etiology. Electronically Signed   By: Jonna Clark M.D.   On: 12/22/2019 23:44  US Venous Img Lower Bilateral (DVT)  Result Date: 12/22/2019 CLINICAL DATA:  Shortness of breath. EXAM: BILATERAL LOWER EXTREMITY VENOUS DOPPLER ULTRASOUND TECHNIQUE: Gray-scale sonography with compression, as well as color and duplex ultrasound, were performed to evaluate the deep venous system(s) from the level of the common femoral vein through the popliteal and proximal calf veins. COMPARISON:  Left lower extremity duplex 11/07/2019. FINDINGS: VENOUS Normal compressibility of the common femoral, superficial femoral, and popliteal veins, as well as the visualized calf veins. Visualized portions of profunda femoral vein and great saphenous vein unremarkable. No filling defects to suggest DVT on grayscale or color Doppler imaging. Doppler waveforms show normal direction of venous flow, normal respiratory plasticity and response to augmentation. OTHER None. Limitations: Patient body habitus and medical condition (on BiPAP). IMPRESSION: No evidence of bilateral lower extremity DVT. Electronically Signed   By: Narda Rutherford M.D.   On: 12/22/2019 22:07   DG Chest Port 1 View  Result Date: 12/22/2019 CLINICAL DATA:  Respiratory distress. EXAM: PORTABLE CHEST 1 VIEW COMPARISON:  Chest x-ray dated November 07, 2019. FINDINGS: Unchanged cardiomegaly and pulmonary vascular congestion. No focal consolidation, pleural effusion, or pneumothorax. No acute osseous abnormality. IMPRESSION: 1. Similar cardiomegaly and pulmonary vascular congestion without overt edema. Electronically Signed   By: Obie Dredge M.D.   On: 12/22/2019 14:38     Medications   Scheduled Meds:  allopurinol  100 mg Oral Daily   atorvastatin  40 mg Oral Daily   DULoxetine  20  mg Oral Daily   fluticasone  2 spray Each Nare BID   fluticasone furoate-vilanterol  1 puff Inhalation Daily   levothyroxine  50 mcg Oral QAC breakfast   lisinopril  10 mg Oral Daily   metoprolol succinate  50 mg Oral Daily   montelukast  10 mg Oral QHS   traZODone  150 mg Oral QHS   Warfarin - Pharmacist Dosing Inpatient   Does not apply q1600   Continuous Infusions:  azithromycin Stopped (12/23/19 0040)   ceFEPime (MAXIPIME) IV 2 g (12/23/19 1442)       LOS: 0 days    Time spent: 30 minutes    Pennie Banter, DO [facility]  12/23/2019, 2:57 PM    If 7PM-7AM, please contact night-coverage. How to contact the [facility] Attending or Consulting provider 7A - 7P or covering provider during after hours 7P -7A, for this patient?    1. Check the care team in [facility] and look for a) attending/consulting [facility] provider listed and b) the [facility] team listed 2. Log into [email or web address] and use [facility]'s universal password to access. If you do not have the password, please contact the hospital operator. 3. Locate the [facility] provider you are looking for under [facility] and page to a number that you can be directly reached. 4. If you still have difficulty reaching the provider, please page the [facility] (Director on Call) for the Hospitalists listed on amion for assistance.

## 2019-12-23 NOTE — ED Notes (Signed)
Pt requesting to wear BiPAP while sleeping. BiPAP replaced.

## 2019-12-23 NOTE — ED Notes (Signed)
Advised nurse that patient has assighned bed

## 2019-12-23 NOTE — ED Notes (Signed)
Patient sleeping with Bipap on. No signs of distress. Will continue to monitor.

## 2019-12-23 NOTE — ED Notes (Signed)
Echo at bedside

## 2019-12-23 NOTE — Hospital Course (Signed)
[name] [name] is a 59 y.o. 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  Presented to the ED on 12/22/19 with progressive shortness of breath since day before.  Reported associated fever/chills, clammy skin, general malaise. Reported fever of 101 F at home.   Placed on BiPAP in the ED.  Evaluation revealed left lower lobe pneumonia seen on CTA chest (acute PE ruled out) with concurrent COPD exacerbation, and RLE cellulitis.

## 2019-12-23 NOTE — ED Notes (Signed)
Advised nurse that patient has assigned bed 

## 2019-12-24 DIAGNOSIS — Z7901 Long term (current) use of anticoagulants: Secondary | ICD-10-CM | POA: Diagnosis not present

## 2019-12-24 DIAGNOSIS — M167 Other unilateral secondary osteoarthritis of hip: Secondary | ICD-10-CM

## 2019-12-24 DIAGNOSIS — G4733 Obstructive sleep apnea (adult) (pediatric): Secondary | ICD-10-CM

## 2019-12-24 DIAGNOSIS — J9621 Acute and chronic respiratory failure with hypoxia: Secondary | ICD-10-CM | POA: Diagnosis not present

## 2019-12-24 LAB — BASIC METABOLIC PANEL
Anion gap: 7 (ref 5–15)
BUN: 22 mg/dL — ABNORMAL HIGH (ref 6–20)
CO2: 34 mmol/L — ABNORMAL HIGH (ref 22–32)
Calcium: 8.7 mg/dL — ABNORMAL LOW (ref 8.9–10.3)
Chloride: 96 mmol/L — ABNORMAL LOW (ref 98–111)
Creatinine, Ser: 0.96 mg/dL (ref 0.61–1.24)
GFR, Estimated: >60 mL/min (ref >60)
Glucose, Bld: 128 mg/dL — ABNORMAL HIGH (ref 70–99)
Potassium: 3.9 mmol/L (ref 3.5–5.1)
Sodium: 137 mmol/L (ref 135–145)

## 2019-12-24 LAB — ECHOCARDIOGRAM COMPLETE
AR max vel: 2.24 cm2
AV Area VTI: 2.3 cm2
AV Area mean vel: 1.95 cm2
AV Mean grad: 6 mmHg
AV Peak grad: 8.9 mmHg
Ao pk vel: 1.49 m/s
Area-P 1/2: 3.33 cm2
Weight: 6560 oz

## 2019-12-24 LAB — PROTIME-INR
INR: 1.8 — ABNORMAL HIGH (ref 0.8–1.2)
Prothrombin Time: 20.3 seconds — ABNORMAL HIGH (ref 11.4–15.2)

## 2019-12-24 LAB — MAGNESIUM: Magnesium: 1.8 mg/dL (ref 1.7–2.4)

## 2019-12-24 LAB — MRSA PCR SCREENING: MRSA by PCR: POSITIVE — AB

## 2019-12-24 MED ORDER — DICLOFENAC SODIUM 1 % EX GEL
2.0000 g | Freq: Four times a day (QID) | CUTANEOUS | Status: DC
  Administered 2019-12-24 – 2019-12-28 (×7): 2 g via TOPICAL
  Filled 2019-12-24: qty 100

## 2019-12-24 MED ORDER — SODIUM CHLORIDE 0.9 % IV SOLN
2.0000 g | INTRAVENOUS | Status: DC
  Administered 2019-12-24 – 2019-12-28 (×5): 2 g via INTRAVENOUS
  Filled 2019-12-24: qty 20
  Filled 2019-12-24 (×2): qty 2
  Filled 2019-12-24: qty 20
  Filled 2019-12-24: qty 2
  Filled 2019-12-24: qty 20

## 2019-12-24 MED ORDER — HYDROCODONE-ACETAMINOPHEN 7.5-325 MG PO TABS
1.0000 | ORAL_TABLET | Freq: Four times a day (QID) | ORAL | Status: DC | PRN
  Administered 2019-12-24 – 2019-12-25 (×2): 2 via ORAL
  Administered 2019-12-26: 1 via ORAL
  Filled 2019-12-24: qty 2
  Filled 2019-12-24: qty 1
  Filled 2019-12-24: qty 2
  Filled 2019-12-24: qty 1

## 2019-12-24 MED ORDER — VANCOMYCIN HCL 2000 MG/400ML IV SOLN
2000.0000 mg | Freq: Once | INTRAVENOUS | Status: AC
  Administered 2019-12-24: 2000 mg via INTRAVENOUS
  Filled 2019-12-24: qty 400

## 2019-12-24 MED ORDER — TRAZODONE HCL 100 MG PO TABS
200.0000 mg | ORAL_TABLET | Freq: Every day | ORAL | Status: DC
  Administered 2019-12-24 – 2019-12-26 (×3): 200 mg via ORAL
  Filled 2019-12-24 (×3): qty 2

## 2019-12-24 MED ORDER — BUTALBITAL-APAP-CAFFEINE 50-325-40 MG PO TABS
1.0000 | ORAL_TABLET | Freq: Four times a day (QID) | ORAL | Status: DC | PRN
  Administered 2019-12-25 – 2019-12-27 (×2): 1 via ORAL
  Filled 2019-12-24 (×3): qty 1

## 2019-12-24 MED ORDER — WARFARIN SODIUM 7.5 MG PO TABS
7.5000 mg | ORAL_TABLET | Freq: Once | ORAL | Status: AC
  Administered 2019-12-24: 7.5 mg via ORAL
  Filled 2019-12-24: qty 1

## 2019-12-24 MED ORDER — MUPIROCIN 2 % EX OINT
1.0000 "application " | TOPICAL_OINTMENT | Freq: Two times a day (BID) | CUTANEOUS | Status: DC
  Administered 2019-12-24 – 2019-12-27 (×2): 1 via NASAL
  Filled 2019-12-24: qty 22

## 2019-12-24 MED ORDER — VANCOMYCIN HCL 1500 MG/300ML IV SOLN
1500.0000 mg | Freq: Two times a day (BID) | INTRAVENOUS | Status: DC
  Administered 2019-12-25 – 2019-12-27 (×5): 1500 mg via INTRAVENOUS
  Filled 2019-12-24 (×6): qty 300

## 2019-12-24 MED ORDER — MECLIZINE HCL 25 MG PO TABS
25.0000 mg | ORAL_TABLET | Freq: Three times a day (TID) | ORAL | Status: DC | PRN
  Filled 2019-12-24: qty 1

## 2019-12-24 MED ORDER — CHLORHEXIDINE GLUCONATE CLOTH 2 % EX PADS
6.0000 | MEDICATED_PAD | Freq: Every day | CUTANEOUS | Status: DC
  Administered 2019-12-25: 6 via TOPICAL

## 2019-12-24 NOTE — TOC Initial Note (Signed)
Transition of Care [facility]) - Initial/Assessment Note    Patient Details  Name: [name] [name] MRN: [id number] Date of Birth: [birth year]  Transition of Care [facility]) CM/SW Contact:    Candie Chroman, LCSW Phone Number: 12/24/2019, 12:15 PM  Clinical Narrative:  Readmission prevention screen complete. CSW met with patient. No supports at bedside. CSW introduced role and explained that discharge planning would be discussed. PCP is Nobie Putnam, MD at [facility]. Patient uses Medicaid Transportation to get to appointments. Pharmacy is [facility]. No issues obtaining medications. Patient had home health services through [facility] about 6 month ago until they determined he no longer required their services. He has been trying to restart services and said he has been pre-authorized for around 80 hours per week but is unable to get orders/paperwork signed by his PCP. He said since the new managed Medicaid was put into place this past summer, his new card has an incorrect PCP listed so his actual PCP won't sign the orders. Patient said he has been working with [facility] to get this corrected but has not received a new card with the correct information yet. CSW left voicemail at [facility] to see if there is anything the hospital can do. Patient has a wheelchair and rollator at home. He uses 3 L of oxygen at baseline which is provided through [employer] per recent TOC needs. No further concerns. CSW encouraged patient to contact CSW as needed. CSW will continue to follow patient for support and facilitate return home when stable.                Expected Discharge Plan: Home/Self Care Barriers to Discharge: Continued Medical Work up   Patient Goals and CMS Choice        Expected Discharge Plan and Services Expected Discharge Plan: Home/Self Care     Post Acute Care Choice: NA Living arrangements for the past 2 months: Apartment                                       Prior Living Arrangements/Services Living arrangements for the past 2 months: Apartment Lives with:: Self Patient language and need for interpreter reviewed:: Yes Do you feel safe going back to the place where you live?: Yes      Need for Family Participation in Patient Care: Yes (Comment)   Current home services: DME Criminal Activity/Legal Involvement Pertinent to Current Situation/Hospitalization: No - Comment as needed  Activities of Daily Living Home Assistive Devices/Equipment: Walker (specify type), CPAP ADL Screening (condition at time of admission) Patient's cognitive ability adequate to safely complete daily activities?: Yes Is the patient deaf or have difficulty hearing?: No Does the patient have difficulty seeing, even when wearing glasses/contacts?: No Does the patient have difficulty concentrating, remembering, or making decisions?: No Patient able to express need for assistance with ADLs?: No Does the patient have difficulty dressing or bathing?: Yes Independently performs ADLs?: Yes (appropriate for developmental age) Does the patient have difficulty walking or climbing stairs?: Yes Weakness of Legs: Both Weakness of Arms/Hands: None  Permission Sought/Granted Permission sought to share information with : Facility Art therapist granted to share information with : Yes, Verbal Permission Granted     Permission granted to share info w AGENCY: [place]        Emotional Assessment Appearance:: Appears stated age Attitude/Demeanor/Rapport: Engaged, Gracious  Affect (typically observed): Accepting, Appropriate, Calm Orientation: : Oriented to Self, Oriented to Place, Oriented to  Time, Oriented to Situation Alcohol / Substance Use: Not Applicable Psych Involvement: No (comment)  Admission diagnosis:  Shortness of breath [R06.02] COPD exacerbation (HCC) [J44.1] Acute respiratory failure with hypoxia (HCC)  [J96.01] Acute on chronic respiratory failure with hypoxia (HCC) [J96.21] Acute on chronic congestive heart failure, unspecified heart failure type [facility]) [I50.9] Patient Active Problem List   Diagnosis Date Noted  . Shortness of breath 12/22/2019  . Atrial fibrillation, chronic ([place]) 12/22/2019  . Class 3 obesity with alveolar hypoventilation, serious comorbidity, and body mass index (BMI) of 60.0 to 69.9 in adult ([place]) 12/22/2019  . GAD (generalized anxiety disorder) 10/12/2019  . Alcohol withdrawal syndrome without complication ([place])   . Lower extremity ulceration, unspecified laterality, with fat layer exposed ([place]) 08/09/2019  . Alcohol abuse   . Pressure injury of skin 06/26/2019  . Iron deficiency anemia 06/22/2019  . Depression 06/22/2019  . Elevated troponin 06/22/2019  . Left-sided Bell's palsy 05/08/2019  . Chronic intractable headache 05/03/2019  . Head injuries, initial encounter 05/03/2019  . Noncompliance by refusing service 05/03/2019  . Facial droop 05/02/2019  . Pharmacologic therapy 04/11/2019  . Disorder of skeletal system 04/11/2019  . Problems influencing health status 04/11/2019  . Chronic anticoagulation (warfarin  COUMADIN) 04/11/2019  . Elevated sed rate 04/11/2019  . Elevated C-reactive protein (CRP) 04/11/2019  . Elevated hemoglobin A1c 04/11/2019  . Hypoalbuminemia 04/11/2019  . Edema due to hypoalbuminemia 04/11/2019  . Elevated brain natriuretic peptide (BNP) level 04/11/2019  . Chronic hip pain (Right) 04/11/2019  . Osteoarthritis of hip (Right) 04/11/2019  . Degenerative joint disease of right hip 03/05/2019  . Hypothyroidism 03/04/2019  . Chronic venous stasis dermatitis of both lower extremities 03/04/2019  . Long term (current) use of anticoagulants 02/23/2019  . PE (pulmonary thromboembolism) ([place]) 01/21/2019  . HLD (hyperlipidemia) 01/21/2019  . CAD (coronary artery disease) 01/21/2019  . Noncompliance 01/09/2019  . Acquired thrombophilia  ([place]) 11/28/2018  . Major depressive disorder, recurrent episode, in partial remission ([place]) 09/17/2018  . Chest pain 08/06/2018  . Personal history of DVT (deep vein thrombosis) 07/13/2018  . History of pulmonary embolism (on Coumadin) 07/13/2018  . Neck pain 07/13/2018  . Chronic low back pain (Bilateral)  w/ sciatica (Bilateral) 07/13/2018  . TBI (traumatic brain injury) ([place]) 07/04/2018  . Abnormal thyroid blood test 06/27/2018  . Type 2 diabetes mellitus with other specified complication ([place]) 71/69/6789  . HTN (hypertension) 06/06/2018  . Chronic gout involving toe, unspecified cause, unspecified laterality 06/06/2018  . Morbid obesity with BMI of 60.0-69.9, adult ([place]) 06/06/2018  . Chronic pain syndrome 06/06/2018  . Ulcers of both lower extremities, limited to breakdown of skin ([place]) 06/06/2018  . Gout 06/06/2018  . Persistent atrial fibrillation ([place])   . Centrilobular emphysema ([place]) 02/02/2018  . OSA (obstructive sleep apnea) 02/02/2018  . Lymphedema 02/02/2018  . Acute on chronic respiratory failure with hypoxia (HCC) 01/27/2018   PCP:  [name], DO Pharmacy:   [place], [address] [place] [address] Phone: [phone] Fax: [phone]     Social Determinants of Health (SDOH) Interventions    Readmission Risk Interventions Readmission Risk Prevention Plan 12/24/2019 01/25/2019 10/25/2018  Transportation Screening Complete Complete Complete  PCP or Specialist Appt within 3-5 Days - - Complete  HRI or [place] - - Complete  Social Work Consult for Recovery  Care Planning/Counseling - - Complete  Palliative Care Screening - - Not Applicable  Medication Review (RN Care Manager) Complete Complete Complete  PCP or Specialist appointment within 3-5 days of discharge Complete Complete -  HRI or [place] - Complete -  SW Recovery Care/Counseling Consult Complete Complete -  Palliative Care  Screening Not Applicable Not Applicable -  Agency Not Applicable Not Applicable -

## 2019-12-24 NOTE — Consult Note (Signed)
Pharmacy Antibiotic Note  [name] [name] is a 59 y.o. male admitted on 12/22/2019 with pneumonia accompanying chronic (CXR shows mild patchy airspace opacity at the posterior left lung base and possible early infectious etiology. Additionally he was admitted with a right lateral LL wound that was cultured and subsequently grew out MRSA. Pharmacy has been consulted for vancomycin dosing.  Plan: start vancomycin 2000 mg IV x 1 then 1500 mg IV every 12 hours  Daily Scr while on vancomycin to assess renal function  Ke: 0.071 h-1, T1/2: 9.8 h  Css (calculated): 28.1 / 13.4 mcg/mL  Levels as clinically indicated  Weight: (!) 186 kg (410 lb)  Temp (24hrs), Avg:98.3 F (36.8 C), Min:97.5 F (36.4 C), Max:98.8 F (37.1 C)  Recent Labs  Lab 12/22/19 1259 12/23/19 0537 12/24/19 0423  WBC 9.3 7.2  --   CREATININE 0.82 0.87 0.96    Estimated Creatinine Clearance: 135.2 mL/min (by C-G formula based on SCr of 0.96 mg/dL).    No Known Allergies  Antimicrobials this admission: cefepime 11/6 >> 11/8 azithromycin 11/6 >> ceftriaxone 11/8 >>  vancomycin 11/8 >>  Microbiology results: 11/5 WCx: Ab MRSA, mod GPR, rare GNR 11/7 BCx: NG 1 day 11/7 RCx: pending  11/7 SARS CoV-2: negative  11/7 influenza A/B: negative 11/7 MRSA PCR: pending  Thank you for allowing pharmacy to be a part of this patient's care.  Lowella Bandy 12/24/2019 1:07 PM

## 2019-12-24 NOTE — Progress Notes (Signed)
PROGRESS NOTE    [name] [name]   SRP:[id number]  DOB: 10-02-1960  PCP: [name], DO    DOA: 12/22/2019 LOS: 1   Brief Narrative   [name] [name] is a 59 y.o. 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  Presented to the ED on 12/22/19 with progressive shortness of breath since day before.  Reported associated fever/chills, clammy skin, general malaise. Reported fever of 101 F at home.   Placed on BiPAP in the ED.  Evaluation revealed left lower lobe pneumonia seen on CTA chest (acute PE ruled out) with concurrent COPD exacerbation, and RLE cellulitis.     Assessment & Plan   Principal Problem:   Acute on chronic respiratory failure with hypoxia (HCC) Active Problems:   OSA (obstructive sleep apnea)   HTN (hypertension)   Morbid obesity with BMI of 60.0-69.9, adult (HCC)   Chest pain   HLD (hyperlipidemia)   CAD (coronary artery disease)   Hypothyroidism   Degenerative joint disease of right hip   Chronic anticoagulation (warfarin  COUMADIN)   Shortness of breath   Atrial fibrillation, chronic (HCC)   Class 3 obesity with alveolar hypoventilation, serious comorbidity, and body mass index (BMI) of 60.0 to 69.9 in adult [facility])   Acute on chronic respiratory failure with hypoxia due to Community-acquired Pneumonia - present on admission.  Presented in respiratory distress requiring BiPAP.   CTA ruled out PE but showed LLL infiltrate consistent with pneumonia.  Chart review shows treated with Prednisone and Levaquin in mid-Oct for sinusitis and mild COPD exacerbation.   --Continue Cefepime and Zithromax --Scheduled Mucinex --IS and flutter --Tylenol PRN fever --Supplemental O2 as needed, maintain O2 sat > 88%, wean  as tolerated --follow blood & sputum cultures  Sepsis - due to CA-PNA and RLE cellulitis.  Meets SIRS criteria with tachycardia, tachypnea, with symptoms and imaging consistent with pneumonia.  No evidence of organ dysfunction.  Mgmt as above.    Right lower extremity cellulitis - POA. Pt has chronic lymphedema and presented with cellulitis. On Cefepime as above.  11/8 - Vancomycin added for wound culture of 11/5 growing abundant MRSA.  Shortness of breath - due to PNA, ?COPD.  Mgmt as above.  Improving.  Headache - Tylenol or Fioricet PRN  Dizziness - description sounds like vertigo with sensation of room spinning and associated nausea.  Trial of meclizine.  Chronic pain - PRN Norco ordered  Bilateral knee OA / Knee Pain - topical Voltaren gel, oral analgesics PRN  COPD - seems stable, no wheezing on exam and good aeration.  Given IV steroids in the ED, but not wheezing on exam today, will defer additional steroids as acute respiratory illness likely due to PNA. Monitor closely for wheezing, and consider resume steroids.   Resumed on home regimen.  Breo was added on admission.  Scheduled Duonebs.  Chronic diastolic CHF - seems compensated with baseline edema per patient.   --Continue metoprolol --Continue torsemide  --Monitor renal function and electrolytes  Chest pain - POA, Resolved.  Had very mild troponin elevation on presentation 41>>19.   Chest pain has resolved, no acute ischemic ECG changes.  Likely secondary to PNA.  Monitor.  Coronary artery disease - continue ASA, statin  Essential Hypertension - continue lisinopril, Toprol-XL  OSA (obstructive sleep apnea) - CPAP  ordered  Chronic anticoagulation (warfarin  COUMADIN) - for hx of PE's.  Pharmacy consulted. Continue warfarin. Reported on admission, has not been followed recently for [facility] outpatient.  Atrial fibrillation, chronic - rate controlled.  Continue warfarin, Toprol-XL  Hyperlipidemia - continue  Lipitor  Gout - not acutely flared. Continue allopurinol.  Insomnia - trazodone  Hypothyroidism - levothyroxine  Morbid Obesity: Body mass index is 62.34 kg/m.  Complicates overall care and prognosis.  Hx of TBI - age 6, with resulting left-sided deafness.    Depression/Anxiety - Cymbalta   DVT prophylaxis: Place TED hose Start: 12/22/19 1628 warfarin (COUMADIN) tablet 7.5 mg   Diet:  Diet Orders (From admission, onward)    Start     Ordered   12/22/19 1746  Diet 2 gram sodium Room service appropriate? Yes; Fluid consistency: Thin  Diet effective now       Question Answer Comment  Room service appropriate? Yes   Fluid consistency: Thin      12/22/19 1747            Code Status: Partial Code    Subjective 12/24/19    Pt seen up in chair today.  Not feeling well at all.  Has a bad headache, says been having sinus headaches recently.  Complains of dizziness with nausea "just sitting here".  Further describes dizziness to be room spinning and positional, has not tried meclizine but agreeable to try.  Also mentions diffuse pain (hx of many traumatic injuries with chronic back/neck pain, bad OA in knees).  Also reports still getting hot spells followed by cold chills.    Also reports insomnia despite trazodone, only 2 hours last night, 4 hours total past few days.   Disposition Plan & Communication   Status is: Inpatient  Remains inpatient appropriate because:IV treatments appropriate due to intensity of illness or inability to take PO, oxygen requirements above baseline, on IV antibiotics   Dispo: The patient is from: Home              Anticipated d/c is to: Home              Anticipated d/c date is: 2-3 days              Patient currently is not medically stable to d/c.    Family Communication: none at bedside, will attempt to call    Consults, Procedures, Significant Events   Consultants:   none  Procedures:   none  Antimicrobials:  Anti-infectives  (From admission, onward)   Start     Dose/Rate Route Frequency Ordered Stop   12/25/19 0600  vancomycin (VANCOREADY) IVPB 1500 mg/300 mL        1,500 mg 150 mL/hr over 120 Minutes Intravenous Every 12 hours 12/24/19 1326     12/24/19 1600  vancomycin (VANCOREADY) IVPB 2000 mg/400 mL        2,000 mg 200 mL/hr over 120 Minutes Intravenous  Once 12/24/19 1326     12/24/19 1200  cefTRIAXone (ROCEPHIN) 2 g in sodium chloride 0.9 % 100 mL IVPB       Note to Pharmacy: Adjust start time accordingly based on last cefepime given   2 g 200 mL/hr over 30 Minutes Intravenous Every 24 hours 12/24/19 0752     12/23/19 1000  cefTRIAXone (ROCEPHIN) 1 g in sodium chloride 0.9 % 100 mL IVPB  Status:  Discontinued        1 g 200 mL/hr over 30 Minutes Intravenous Every 24 hours  12/23/19 0001 12/23/19 0002   12/22/19 2200  azithromycin (ZITHROMAX) 500 mg in sodium chloride 0.9 % 250 mL IVPB        500 mg 250 mL/hr over 60 Minutes Intravenous Every 24 hours 12/22/19 2115 12/25/19 2159   12/22/19 1645  ceFEPIme (MAXIPIME) 2 g in sodium chloride 0.9 % 100 mL IVPB  Status:  Discontinued        2 g 200 mL/hr over 30 Minutes Intravenous Every 8 hours 12/22/19 1631 12/24/19 0752       Objective   Vitals:   12/24/19 0434 12/24/19 0812 12/24/19 0829 12/24/19 1137  BP: (!) 105/57 (!) 121/103  114/69  Pulse: 61 75  67  Resp: (!) 22   18  Temp: (!) 97.5 F (36.4 C)   98.2 F (36.8 C)  TempSrc: Oral   Oral  SpO2: 98% 95% 99% 98%  Weight:        Intake/Output Summary (Last 24 hours) at 12/24/2019 1447 Last data filed at 12/24/2019 1300 Gross per 24 hour  Intake 1100 ml  Output 7325 ml  Net -6225 ml   Filed Weights   12/22/19 1301  Weight: (!) 186 kg    Physical Exam:  General exam: awake, alert, no acute distress, up in chair, morbidly obese HEENT: conjunctival injection, face flushed moist mucus membranes, hearing grossly normal  Respiratory system: CTAB with diminished bases, no wheezes, normal  respiratory effort on 4 L/min oxygen Cardiovascular system: normal S1/S2, RRR, distant heart sounds due to body habitus Central nervous system: A&O x3. no gross focal neurologic deficits, normal speech Extremities: moves all, LE's wrapped, stable lower extremity edema     Labs   Data Reviewed: I have personally reviewed following labs and imaging studies  CBC: Recent Labs  Lab 12/22/19 1259 12/23/19 0537  WBC 9.3 7.2  HGB 13.1 13.2  HCT 41.2 40.5  MCV 93.4 91.4  PLT 219 204   Basic Metabolic Panel: Recent Labs  Lab 12/22/19 1259 12/23/19 0537 12/24/19 0423  NA 137 135 137  K 3.7 4.2 3.9  CL 96* 98 96*  CO2 30 29 34*  GLUCOSE 160* 171* 128*  BUN 11 15 22*  CREATININE 0.82 0.87 0.96  CALCIUM 7.9* 8.5* 8.7*  MG  --   --  1.8   GFR: Estimated Creatinine Clearance: 135.2 mL/min (by C-G formula based on SCr of 0.96 mg/dL). Liver Function Tests: Recent Labs  Lab 12/22/19 1259  AST 35  ALT 30  ALKPHOS 75  BILITOT 1.5*  PROT 6.5  ALBUMIN 3.1*   No results for input(s): LIPASE, AMYLASE in the last 168 hours. No results for input(s): AMMONIA in the last 168 hours. Coagulation Profile: Recent Labs  Lab 12/22/19 1259 12/23/19 0537 12/24/19 0423  INR 1.3* 1.2 1.8*   Cardiac Enzymes: No results for input(s): CKTOTAL, CKMB, CKMBINDEX, TROPONINI in the last 168 hours. BNP (last 3 results) No results for input(s): PROBNP in the last 8760 hours. HbA1C: No results for input(s): HGBA1C in the last 72 hours. CBG: No results for input(s): GLUCAP in the last 168 hours. Lipid Profile: No results for input(s): CHOL, HDL, LDLCALC, TRIG, CHOLHDL, LDLDIRECT in the last 72 hours. Thyroid Function Tests: Recent Labs    12/22/19 0537  TSH 1.601   Anemia Panel: No results for input(s): VITAMINB12, FOLATE, FERRITIN, TIBC, IRON, RETICCTPCT in the last 72 hours. Sepsis Labs: No results for input(s): PROCALCITON, LATICACIDVEN in the last 168 hours.  Recent Results (from  the  past 240 hour(s))  Aerobic Culture (superficial specimen)     Status: None (Preliminary result)   Collection Time: 12/21/19  8:58 AM   Specimen: Wound  Result Value Ref Range Status   Specimen Description   Final    WOUND Performed at [facility], [address], [place], [place] [address]    Special Requests RIGHT LEG  Final   Gram Stain   Final    NO WBC SEEN ABUNDANT GRAM POSITIVE COCCI MODERATE GRAM POSITIVE RODS RARE GRAM NEGATIVE RODS    Culture   Final    ABUNDANT METHICILLIN RESISTANT STAPHYLOCOCCUS AUREUS RARE FLAVOBACTERIUM ODORATUM SUSCEPTIBILITIES TO FOLLOW Performed at [facility], [address] [place], [place] [address]    Report Status PENDING  Incomplete   Organism ID, Bacteria METHICILLIN RESISTANT STAPHYLOCOCCUS AUREUS  Final      Susceptibility   Methicillin resistant staphylococcus aureus - MIC*    CIPROFLOXACIN >=8 RESISTANT Resistant     ERYTHROMYCIN >=8 RESISTANT Resistant     GENTAMICIN <=0.5 SENSITIVE Sensitive     OXACILLIN >=4 RESISTANT Resistant     TETRACYCLINE <=1 SENSITIVE Sensitive     VANCOMYCIN <=0.5 SENSITIVE Sensitive     TRIMETH/SULFA <=10 SENSITIVE Sensitive     CLINDAMYCIN <=0.25 SENSITIVE Sensitive     RIFAMPIN <=0.5 SENSITIVE Sensitive     Inducible Clindamycin NEGATIVE Sensitive     * ABUNDANT METHICILLIN RESISTANT STAPHYLOCOCCUS AUREUS  Respiratory Panel by RT PCR (Flu A&B, Covid) - Nasopharyngeal Swab     Status: None   Collection Time: 12/22/19  1:15 PM   Specimen: Nasopharyngeal Swab  Result Value Ref Range Status   SARS Coronavirus 2 by RT PCR NEGATIVE NEGATIVE Final    Comment: (NOTE) SARS-CoV-2 target nucleic acids are NOT DETECTED.  The SARS-CoV-2 RNA is generally detectable in upper respiratoy specimens during the acute phase of infection. The lowest concentration of SARS-CoV-2 viral copies this assay can detect is 131 copies/mL. A negative result does not preclude SARS-Cov-2 infection and  should not be used as the sole basis for treatment or other patient management decisions. A negative result may occur with  improper specimen collection/handling, submission of specimen other than nasopharyngeal swab, presence of viral mutation(s) within the areas targeted by this assay, and inadequate number of viral copies (<131 copies/mL). A negative result must be combined with clinical observations, patient history, and epidemiological information. The expected result is Negative.  Fact Sheet for Patients:  https://www.moore.com/  Fact Sheet for Healthcare Providers:  https://www.young.biz/  This test is no t yet approved or cleared by the [place] FDA and  has been authorized for detection and/or diagnosis of SARS-CoV-2 by FDA under an Emergency Use Authorization (EUA). This EUA will remain  in effect (meaning this test can be used) for the duration of the COVID-19 declaration under Section 564(b)(1) of the Act, 21 U.S.C. section 360bbb-3(b)(1), unless the authorization is terminated or revoked sooner.     Influenza A by PCR NEGATIVE NEGATIVE Final   Influenza B by PCR NEGATIVE NEGATIVE Final    Comment: (NOTE) The Xpert Xpress SARS-CoV-2/FLU/RSV assay is intended as an aid in  the diagnosis of influenza from Nasopharyngeal swab specimens and  should not be used as a sole basis for treatment. Nasal washings and  aspirates are unacceptable for Xpert Xpress SARS-CoV-2/FLU/RSV  testing.  Fact Sheet for Patients: https://www.moore.com/  Fact Sheet for Healthcare Providers: https://www.young.biz/  This test is not yet approved or cleared by the [place]  [place] FDA and  has been authorized for detection and/or diagnosis of SARS-CoV-2 by  FDA under an Emergency Use Authorization (EUA). This EUA will remain  in effect (meaning this test can be used) for the duration of the  Covid-19 declaration  under Section 564(b)(1) of the Act, 21  U.S.C. section 360bbb-3(b)(1), unless the authorization is  terminated or revoked. Performed at [facility], [address] [place], [place] [address]   CULTURE, BLOOD (ROUTINE X 2) w Reflex to ID Panel     Status: None (Preliminary result)   Collection Time: 12/23/19 12:27 AM   Specimen: BLOOD  Result Value Ref Range Status   Specimen Description BLOOD BLOOD RIGHT HAND  Final   Special Requests   Final    BOTTLES DRAWN AEROBIC AND ANAEROBIC Blood Culture results may not be optimal due to an excessive volume of blood received in culture bottles   Culture   Final    NO GROWTH 1 DAY Performed at [facility], [address], [place], [place] [address]    Report Status PENDING  Incomplete  CULTURE, BLOOD (ROUTINE X 2) w Reflex to ID Panel     Status: None (Preliminary result)   Collection Time: 12/23/19 12:28 AM   Specimen: BLOOD  Result Value Ref Range Status   Specimen Description BLOOD BLOOD LEFT FOREARM  Final   Special Requests   Final    BOTTLES DRAWN AEROBIC ONLY Blood Culture results may not be optimal due to an inadequate volume of blood received in culture bottles   Culture   Final    NO GROWTH 1 DAY Performed at [facility], [address] [place], [place] [address]    Report Status PENDING  Incomplete      Imaging Studies   CT ANGIO CHEST PE W OR WO CONTRAST  Result Date: 12/22/2019 CLINICAL DATA:  Chest pain and shortness of breath EXAM: CT ANGIOGRAPHY CHEST WITH CONTRAST TECHNIQUE: Multidetector CT imaging of the chest was performed using the standard protocol during bolus administration of intravenous contrast. Multiplanar CT image reconstructions and MIPs were obtained to evaluate the vascular anatomy. CONTRAST:  OMNIPAQUE IOHEXOL 350 MG/ML SOLN COMPARISON:  Radiograph same day FINDINGS: Cardiovascular: There is slightly suboptimal opacification of the main pulmonary artery, however no  central or proximal segmental pulmonary embolism. Mild cardiomegaly is seen. No pericardial effusion or thickening. No evidence right heart strain. There is a common origin of the left carotid artery off the brachiocephalic trunk. No stenosis is seen. There is scattered aortic atherosclerosis. There is coronary artery calcifications. Mediastinum/Nodes: No hilar, mediastinal, or axillary adenopathy. Thyroid gland, trachea, and esophagus demonstrate no significant findings. Lungs/Pleura: Minimal bibasilar dependent atelectasis. There is patchy airspace opacity seen at the posterior left lung base with a trace left pleural effusion. No pleural effusion or pneumothorax. No airspace consolidation. Upper Abdomen: No acute abnormalities present in the visualized portions of the upper abdomen. There is elevation of the right hemidiaphragm. Musculoskeletal: No chest wall abnormality. No acute or significant osseous findings. Endplate reactive changes are noted within the mid to lower thoracic spine. Review of the MIP images confirms the above findings. IMPRESSION: Slightly suboptimal opacification of the main pulmonary artery, however no central or segmental pulmonary embolism. Mild patchy airspace opacity at the posterior left lung base with a trace pleural effusion which could be due to atelectasis and/or early infectious etiology. Electronically Signed   By: Jonna Clark M.D.   On: 12/22/2019 23:44  US Venous Img Lower Bilateral (DVT)  Result Date: 12/22/2019 CLINICAL DATA:  Shortness of breath. EXAM: BILATERAL LOWER EXTREMITY VENOUS DOPPLER ULTRASOUND TECHNIQUE: Gray-scale sonography with compression, as well as color and duplex ultrasound, were performed to evaluate the deep venous system(s) from the level of the common femoral vein through the popliteal and proximal calf veins. COMPARISON:  Left lower extremity duplex 11/07/2019. FINDINGS: VENOUS Normal compressibility of the common femoral, superficial femoral,  and popliteal veins, as well as the visualized calf veins. Visualized portions of profunda femoral vein and great saphenous vein unremarkable. No filling defects to suggest DVT on grayscale or color Doppler imaging. Doppler waveforms show normal direction of venous flow, normal respiratory plasticity and response to augmentation. OTHER None. Limitations: Patient body habitus and medical condition (on BiPAP). IMPRESSION: No evidence of bilateral lower extremity DVT. Electronically Signed   By: Narda Rutherford M.D.   On: 12/22/2019 22:07   ECHOCARDIOGRAM COMPLETE  Result Date: 12/24/2019    ECHOCARDIOGRAM REPORT   Patient Name:   YAMATO KOPF Date of Exam: 12/23/2019 Medical Rec #:  [id number]       Height:       68.0 in Accession #:    [id number]      Weight:       410.0 lb Date of Birth:  1960/11/01       BSA:          2.773 m Patient Age:    59 years        BP:           158/99 mmHg Patient Gender: M               HR:           70 bpm. Exam Location:  [facility] Procedure: 2D Echo and Intracardiac Opacification Agent Indications:     Dyspnea  History:         Patient has prior history of Echocardiogram examinations.                  Cardiomyopathy and CHF, COPD; Arrythmias:Atrial Fibrillation.  Sonographer:     L Thornton-Maynard Referring Phys:  [id number] AMY N COX Diagnosing Phys: Harold Hedge MD  Sonographer Comments: Image acquisition challenging due to patient body habitus. IMPRESSIONS  1. Left ventricular ejection fraction, by estimation, is 55 to 60%. The left ventricle has normal function. Left ventricular endocardial border not optimally defined to evaluate regional wall motion. There is mild left ventricular hypertrophy. Left ventricular diastolic parameters were normal.  2. Right ventricular systolic function is normal. The right ventricular size is normal. There is normal pulmonary artery systolic pressure.  3. The mitral valve was not well visualized. Trivial mitral valve regurgitation.  4. The aortic  valve was not well visualized. Aortic valve regurgitation is not visualized. FINDINGS  Left Ventricle: Left ventricular ejection fraction, by estimation, is 55 to 60%. The left ventricle has normal function. Left ventricular endocardial border not optimally defined to evaluate regional wall motion. Definity contrast agent was given IV to delineate the left ventricular endocardial borders. The left ventricular internal cavity size was normal in size. There is mild left ventricular hypertrophy. Left ventricular diastolic parameters were normal. Right Ventricle: The right ventricular size is normal. No increase in right ventricular wall thickness. Right ventricular systolic function is normal. There is normal pulmonary artery systolic pressure. The tricuspid regurgitant velocity is 2.14 m/s, and  with an assumed right atrial pressure of 3 mmHg, the estimated right ventricular  systolic pressure is 21.3 mmHg. Left Atrium: Left atrial size was normal in size. Right Atrium: Right atrial size was normal in size. Pericardium: There is no evidence of pericardial effusion. Mitral Valve: The mitral valve was not well visualized. Trivial mitral valve regurgitation. Tricuspid Valve: The tricuspid valve is not well visualized. Tricuspid valve regurgitation is not demonstrated. Aortic Valve: The aortic valve was not well visualized. Aortic valve regurgitation is not visualized. Aortic valve mean gradient measures 6.0 mmHg. Aortic valve peak gradient measures 8.9 mmHg. Aortic valve area, by VTI measures 2.30 cm. Pulmonic Valve: The pulmonic valve was not well visualized. Pulmonic valve regurgitation is not visualized. Aorta: The aortic root was not well visualized. IAS/Shunts: The interatrial septum was not assessed.  LEFT VENTRICLE PLAX 2D LVOT diam:     2.30 cm  Diastology LV SV:         63       LV e' medial:    6.31 cm/s LV SV Index:   23       LV E/e' medial:  14.5 LVOT Area:     4.15 cm LV e' lateral:   11.70 cm/s                          LV E/e' lateral: 7.8  RIGHT VENTRICLE RV S prime:     16.00 cm/s TAPSE (M-mode): 2.3 cm LEFT ATRIUM             Index LA Vol (A2C):   84.0 ml 30.29 ml/m LA Vol (A4C):   91.1 ml 32.85 ml/m LA Biplane Vol: 87.1 ml 31.41 ml/m  AORTIC VALVE AV Area (Vmax):    2.24 cm AV Area (Vmean):   1.95 cm AV Area (VTI):     2.30 cm AV Vmax:           149.00 cm/s AV Vmean:          120.000 cm/s AV VTI:            0.275 m AV Peak Grad:      8.9 mmHg AV Mean Grad:      6.0 mmHg LVOT Vmax:         80.30 cm/s LVOT Vmean:        56.400 cm/s LVOT VTI:          0.152 m LVOT/AV VTI ratio: 0.55 MITRAL VALVE               TRICUSPID VALVE MV Area (PHT): 3.33 cm    TR Peak grad:   18.3 mmHg MV E velocity: 91.67 cm/s  TR Vmax:        214.00 cm/s                             SHUNTS                            Systemic VTI:  0.15 m                            Systemic Diam: 2.30 cm Harold Hedge MD Electronically signed by Harold Hedge MD Signature Date/Time: 12/24/2019/1:15:06 PM    Final      Medications   Scheduled Meds: . allopurinol  100 mg Oral Daily  . atorvastatin  40 mg Oral Daily  . dextromethorphan-guaiFENesin  1 tablet Oral BID  . diclofenac Sodium  2 g Topical QID  . DULoxetine  20 mg Oral Daily  . fluticasone  2 spray Each Nare BID  . fluticasone furoate-vilanterol  1 puff Inhalation Daily  . ipratropium-albuterol  3 mL Nebulization TID  . levothyroxine  50 mcg Oral QAC breakfast  . lisinopril  10 mg Oral Daily  . metoprolol succinate  50 mg Oral Daily  . montelukast  10 mg Oral QHS  . torsemide  40 mg Oral BID  . traZODone  200 mg Oral QHS  . warfarin  7.5 mg Oral ONCE-1600  . Warfarin - Pharmacist Dosing Inpatient   Does not apply q1600   Continuous Infusions: . azithromycin Stopped (12/23/19 2314)  . cefTRIAXone (ROCEPHIN)  IV 2 g (12/24/19 1240)  . [START ON 12/25/2019] vancomycin    . vancomycin         LOS: 1 day    Time spent: 30 minutes with > 50% spent in coordination of care  and direct patient contact    Pennie Banter, DO [facility]  12/24/2019, 2:47 PM    If 7PM-7AM, please contact night-coverage. How to contact the [facility] Attending or Consulting provider 7A - 7P or covering provider during after hours 7P -7A, for this patient?    1. Check the care team in [facility] and look for a) attending/consulting [facility] provider listed and b) the [facility] team listed 2. Log into [email or web address] and use [facility] universal password to access. If you do not have the password, please contact the hospital operator. 3. Locate the [facility] provider you are looking for under [facility] and page to a number that you can be directly reached. 4. If you still have difficulty reaching the provider, please page the [facility] (Director on Call) for the Hospitalists listed on amion for assistance.

## 2019-12-24 NOTE — Evaluation (Signed)
Physical Therapy Evaluation Patient Details Name: [name] [name] MRN: [id number] DOB: [birth year] Today's Date: 12/24/2019   History of Present Illness  Pt is a 59 y.o. male presenting to hospital 11/6 with respiratory distress/SOB.  Pt admitted with acute on chronic respiratory failure with hypoxia (d/t community acquired PNA), sepsis, and R LE cellulitis.  PMH includes anxiety, COPD, CHF with EF 25-30%, morbid obesity, sleep apnea, a-fib, h/o TBI, chronic B knee pain, clotting disorder, h/o DVT, h/o PE, htn, MI, chronic L ear hearing loss, L sided Bell's palsy, cardioversion, h/o leg surgery.  Clinical Impression  Prior to hospital admission, pt was modified independent ambulating short distances in home (about 20-30 feet) with rollator; 3 L home O2 use; used electric w/c for longer distances outside of home; lives in 1 level home with ramp to enter.  Currently pt is SBA with transfer recliner to bed; pt declined further mobility d/t chronic body pain (nurse aware of pt's pain) and wanting to take a nap d/t being tired (respiratory came and placed CPAP on per pt's request).  Pt would benefit from skilled PT to address noted impairments and functional limitations (see below for any additional details).  Upon hospital discharge, pt would benefit from HHPT.    Follow Up Recommendations Home health PT    Equipment Recommendations   (pt has needed DME at home already)    Recommendations for Other Services       Precautions / Restrictions Precautions Precautions: None Restrictions Weight Bearing Restrictions: No      Mobility  Bed Mobility Overal bed mobility: Modified Independent             General bed mobility comments: Sit to semi-supine in bed with mild increased effort to get LE's into bed    Transfers Overall transfer level: Needs assistance Equipment used: None Transfers: Sit to/from Stand;Stand Pivot Transfers Sit to Stand: Supervision (with UE support on stable  furniture) Stand pivot transfers: Supervision (stand step turn recliner to bed)       General transfer comment: increased effort to perform on own but steady with UE support on furniture as needed  Ambulation/Gait             General Gait Details: Pt declined ambulation d/t pain  Stairs            Wheelchair Mobility    Modified Rankin (Stroke Patients Only)       Balance Overall balance assessment: Needs assistance Sitting-balance support: No upper extremity supported;Feet supported Sitting balance-Leahy Scale: Normal Sitting balance - Comments: steady sitting reaching outside BOS   Standing balance support: Single extremity supported Standing balance-Leahy Scale: Fair Standing balance comment: steady standing with at least single UE support                             Pertinent Vitals/Pain Pain Assessment: 0-10 Pain Score: 6  Pain Location: B knee and shoulder pain (chronic) Pain Descriptors / Indicators: Aching;Sore;Constant;Discomfort Pain Intervention(s): Limited activity within patient's tolerance;Monitored during session;Premedicated before session;Repositioned  Vitals (HR and O2 on 4 L O2 via nasal cannula) stable and WFL throughout treatment session.    Home Living Family/patient expects to be discharged to:: Private residence Living Arrangements: Alone   Type of Home: House Home Access: Ramped entrance     Home Layout: One level Home Equipment: Environmental consultant - 4 wheels;Electric scooter;Shower seat;Hand held shower head;Wheelchair - power Additional Comments: Uses 4ww in home (short distances) and  electric w/c for longer distance    Prior Function Level of Independence: Independent with assistive device(s)         Comments: Has transport for MD appts     Hand Dominance        Extremity/Trunk Assessment   Upper Extremity Assessment Upper Extremity Assessment: Generalized weakness    Lower Extremity Assessment Lower  Extremity Assessment: Generalized weakness       Communication   Communication: No difficulties  Cognition Arousal/Alertness: Awake/alert Behavior During Therapy: WFL for tasks assessed/performed Overall Cognitive Status: Within Functional Limits for tasks assessed                                        General Comments   Nursing cleared pt for participation in physical therapy.  Pt agreeable to limited PT session.    Exercises     Assessment/Plan    PT Assessment Patient needs continued PT services  PT Problem List Decreased strength;Decreased activity tolerance;Decreased balance;Decreased mobility;Cardiopulmonary status limiting activity;Obesity;Pain       PT Treatment Interventions DME instruction;Gait training;Functional mobility training;Therapeutic activities;Therapeutic exercise;Balance training;Patient/family education    PT Goals (Current goals can be found in the Care Plan section)  Acute Rehab PT Goals Patient Stated Goal: to go home PT Goal Formulation: With patient Time For Goal Achievement: 01/07/20 Potential to Achieve Goals: Good    Frequency Min 2X/week   Barriers to discharge        Co-evaluation               AM-PAC PT "6 Clicks" Mobility  Outcome Measure Help needed turning from your back to your side while in a flat bed without using bedrails?: None Help needed moving from lying on your back to sitting on the side of a flat bed without using bedrails?: None Help needed moving to and from a bed to a chair (including a wheelchair)?: A Little Help needed standing up from a chair using your arms (e.g., wheelchair or bedside chair)?: A Little Help needed to walk in hospital room?: A Little Help needed climbing 3-5 steps with a railing? : Total 6 Click Score: 18    End of Session Equipment Utilized During Treatment: Oxygen (4 L O2 via nasal cannula) Activity Tolerance: Patient limited by fatigue Patient left: in bed;with call  bell/phone within reach Nurse Communication: Mobility status;Precautions PT Visit Diagnosis: Other abnormalities of gait and mobility (R26.89);Muscle weakness (generalized) (M62.81);Difficulty in walking, not elsewhere classified (R26.2)    Time: 6759-1638 PT Time Calculation (min) (ACUTE ONLY): 20 min   Charges:   PT Evaluation $PT Eval Low Complexity: 1 Low         Miran Kautzman, PT 12/24/19, 5:07 PM

## 2019-12-24 NOTE — Progress Notes (Signed)
ANTICOAGULATION CONSULT NOTE  Pharmacy Consult for Warfarin Dosing Indication: atrial fibrillation  Patient Measurements: Weight: (!) 186 kg (410 lb)   Vital Signs: Temp: 97.5 F (36.4 C) (11/08 0434) Temp Source: Oral (11/08 0434) BP: 105/57 (11/08 0434) Pulse Rate: 61 (11/08 0434)  Labs: Recent Labs    12/22/19 1259 12/23/19 0537 12/24/19 0423  HGB 13.1 13.2  --   HCT 41.2 40.5  --   PLT 219 204  --   APTT 32  --   --   LABPROT 15.8* 14.8 20.3*  INR 1.3* 1.2 1.8*  CREATININE 0.82 0.87 0.96  TROPONINIHS 41* 19*  --     Estimated Creatinine Clearance: 135.2 mL/min (by C-G formula based on SCr of 0.96 mg/dL).   Medical History: Past Medical History:  Diagnosis Date  . Allergy   . Anxiety   . Arthritis   . Asthma   . Brain damage   . Chronic pain of both knees 07/13/2018  . Clotting disorder (HCC)   . COPD (chronic obstructive pulmonary disease) (HCC)   . Depression   . GERD (gastroesophageal reflux disease)   . HFrEF (heart failure with reduced ejection fraction) (HCC)    a. 03/2018 Echo: EF 25-30%, diff HK. Mod LAE.  [name] History of DVT (deep vein thrombosis)   . History of pulmonary embolism    a. Chronic coumadin.  [name] Hypertension   . MI (myocardial infarction) (HCC)   . Morbid obesity (HCC)   . Neck pain 07/13/2018  . NICM (nonischemic cardiomyopathy) (HCC)    a. s/p Cath x 3 - reportedly nl cors. Last cath 2019 in [place]; b. a. 03/2018 Echo: EF 25-30%, diff HK.  [name] Persistent atrial fibrillation (HCC)    a. 03/2018 s/p DCCV; b. CHA2DS2VASc = 1-->Xarelto (later changed to warfarin); c. 05/2018 recurrent afib-->Amio initiated.  . Sleep apnea   . Sleep apnea     Home Dose: Warfarin 5mg  daily  Assessment: Patient is a 59yo male admitted for shortness of breath. Patient has a history of afib and takes warfarin for stroke prevention. Pharmacy consulted for Warfarin dosing. INR on admission was subtherapeutic. I spoke with the patient and he admits to missing at least  one dose PTA, which would explain the low INR  Date   INR   Dose 11/6   1.3   7.5 mg  11/7   1.2   7.5 mg 11/8   1.8   7.5 mg  DDIs: ceftriaxone, azithromycin  Goal of Therapy:  INR 2-3 Monitor platelets by anticoagulation protocol: Yes   Plan:   Repeat warfarin 7.5mg  again tonight  Daily INR checks  CBC at least every 3 days per protocol  13/8, PharmD, BCPS Clinical Pharmacist 12/24/2019 7:06 AM

## 2019-12-25 DIAGNOSIS — I482 Chronic atrial fibrillation, unspecified: Secondary | ICD-10-CM | POA: Diagnosis not present

## 2019-12-25 DIAGNOSIS — J189 Pneumonia, unspecified organism: Secondary | ICD-10-CM | POA: Diagnosis present

## 2019-12-25 DIAGNOSIS — R0602 Shortness of breath: Secondary | ICD-10-CM | POA: Diagnosis not present

## 2019-12-25 DIAGNOSIS — Z7901 Long term (current) use of anticoagulants: Secondary | ICD-10-CM | POA: Diagnosis not present

## 2019-12-25 DIAGNOSIS — J9621 Acute and chronic respiratory failure with hypoxia: Secondary | ICD-10-CM | POA: Diagnosis not present

## 2019-12-25 LAB — BASIC METABOLIC PANEL
Anion gap: 13 (ref 5–15)
BUN: 29 mg/dL — ABNORMAL HIGH (ref 6–20)
CO2: 33 mmol/L — ABNORMAL HIGH (ref 22–32)
Calcium: 8.7 mg/dL — ABNORMAL LOW (ref 8.9–10.3)
Chloride: 88 mmol/L — ABNORMAL LOW (ref 98–111)
Creatinine, Ser: 1.02 mg/dL (ref 0.61–1.24)
GFR, Estimated: >60 mL/min (ref >60)
Glucose, Bld: 155 mg/dL — ABNORMAL HIGH (ref 70–99)
Potassium: 3.9 mmol/L (ref 3.5–5.1)
Sodium: 134 mmol/L — ABNORMAL LOW (ref 135–145)

## 2019-12-25 LAB — AEROBIC CULTURE W GRAM STAIN (SUPERFICIAL SPECIMEN): Gram Stain: NONE SEEN

## 2019-12-25 LAB — PROTIME-INR
INR: 2 — ABNORMAL HIGH (ref 0.8–1.2)
Prothrombin Time: 21.9 seconds — ABNORMAL HIGH (ref 11.4–15.2)

## 2019-12-25 MED ORDER — WARFARIN SODIUM 7.5 MG PO TABS
7.5000 mg | ORAL_TABLET | Freq: Once | ORAL | Status: AC
  Administered 2019-12-25: 7.5 mg via ORAL
  Filled 2019-12-25: qty 1

## 2019-12-25 NOTE — Progress Notes (Signed)
ANTICOAGULATION CONSULT NOTE  Pharmacy Consult for Warfarin Dosing Indication: atrial fibrillation  Patient Measurements: Weight: (!) 186 kg (410 lb)   Vital Signs: Temp: 97.6 F (36.4 C) (11/09 0422) Temp Source: Oral (11/09 0422) BP: 103/88 (11/09 0422) Pulse Rate: 99 (11/09 0422)  Labs: Recent Labs    12/22/19 1259 12/22/19 1259 12/23/19 0537 12/24/19 0423 12/25/19 0519  HGB 13.1  --  13.2  --   --   HCT 41.2  --  40.5  --   --   PLT 219  --  204  --   --   APTT 32  --   --   --   --   LABPROT 15.8*   < > 14.8 20.3* 21.9*  INR 1.3*   < > 1.2 1.8* 2.0*  CREATININE 0.82   < > 0.87 0.96 1.02  TROPONINIHS 41*  --  19*  --   --    < > = values in this interval not displayed.    Estimated Creatinine Clearance: 127.3 mL/min (by C-G formula based on SCr of 1.02 mg/dL).   Medical History: Past Medical History:  Diagnosis Date  . Allergy   . Anxiety   . Arthritis   . Asthma   . Brain damage   . Chronic pain of both knees 07/13/2018  . Clotting disorder (HCC)   . COPD (chronic obstructive pulmonary disease) (HCC)   . Depression   . GERD (gastroesophageal reflux disease)   . HFrEF (heart failure with reduced ejection fraction) (HCC)    a. 03/2018 Echo: EF 25-30%, diff HK. Mod LAE.  [name] History of DVT (deep vein thrombosis)   . History of pulmonary embolism    a. Chronic coumadin.  [name] Hypertension   . MI (myocardial infarction) (HCC)   . Morbid obesity (HCC)   . Neck pain 07/13/2018  . NICM (nonischemic cardiomyopathy) (HCC)    a. s/p Cath x 3 - reportedly nl cors. Last cath 2019 in [place]; b. a. 03/2018 Echo: EF 25-30%, diff HK.  [name] Persistent atrial fibrillation (HCC)    a. 03/2018 s/p DCCV; b. CHA2DS2VASc = 1-->Xarelto (later changed to warfarin); c. 05/2018 recurrent afib-->Amio initiated.  . Sleep apnea   . Sleep apnea     Home Dose: Warfarin 5mg  daily  Assessment: Patient is a 59yo male admitted for shortness of breath. Patient has a history of afib and takes  warfarin for stroke prevention. Pharmacy consulted for Warfarin dosing. INR on admission was subtherapeutic. I spoke with the patient and he admits to missing at least one dose PTA, which would explain the low INR  Date   INR   Dose 11/6   1.3   7.5 mg  11/7   1.2   7.5 mg 11/8   1.8   7.5 mg 11/9   2.0   7.5 mg  DDIs: ceftriaxone, azithromycin, vancomycin  Goal of Therapy:  INR 2-3 Monitor platelets by anticoagulation protocol: Yes   Plan:   INR therapeutic but borderline  repeat warfarin 7.5mg  again tonight  Daily INR checks  CBC at least every 3 days per protocol  13/9, PharmD, BCPS Clinical Pharmacist 12/25/2019 7:17 AM

## 2019-12-25 NOTE — TOC Progression Note (Addendum)
Transition of Care [facility]) - Progression Note    Patient Details  Name: Raynard Mapps MRN: [id number] Date of Birth: 05-15-1960  Transition of [facility]) CM/SW Contact  Margarito Liner, LCSW Phone Number: 12/25/2019, 10:47 AM  Clinical Narrative: CSW spoke to [employer] office. The last documentation they have shows that patient was discharged on 06/13/19. No notes on being pre-approved for 80 hours/week. CSW checking to see if anyone can take him for home health. [facility] is reviewing. [facility] and [facility] unable to accept.  2:01 pm: [facility], [facility], [facility], [facility], and [facility] unable to accept patient.  Expected Discharge Plan: Home/Self Care Barriers to Discharge: Continued Medical Work up  Expected Discharge Plan and Services Expected Discharge Plan: Home/Self Care     Post Acute Care Choice: NA Living arrangements for the past 2 months: Apartment                                       Social Determinants of Health (SDOH) Interventions    Readmission Risk Interventions Readmission Risk Prevention Plan 12/24/2019 01/25/2019 10/25/2018  Transportation Screening Complete Complete Complete  PCP or Specialist Appt within 3-5 Days - - Complete  HRI or Home Care Consult - - Complete  Social Work Consult for Recovery Care Planning/Counseling - - Complete  Palliative Care Screening - - Not Applicable  Medication Review [employer]) Complete Complete Complete  PCP or Specialist appointment within 3-5 days of discharge Complete Complete -  HRI or Home Care Consult - Complete -  SW Recovery Care/Counseling Consult Complete Complete -  Palliative Care Screening Not Applicable Not Applicable -  Skilled Nursing Facility Not Applicable Not Applicable -

## 2019-12-25 NOTE — Progress Notes (Signed)
[name] [name] ([id number]) Visit Report for 12/21/2019 Chief Complaint Document Details Patient Name: [name] [name], [name] [name] Date of Service: 12/21/2019 8:00 AM Medical Record Number: [id number] Patient Account Number: [id number] Date of Birth/Sex: 13-Jan-1961 (59 y.o. M) Treating RN: Cornell Barman Primary Care Provider: Nobie Putnam Other Clinician: Referring Provider: Nobie Putnam Treating Provider/Extender: Skipper Cliche in Treatment: 59 Information Obtained from: Patient Chief Complaint Bilateral LE ulcers Electronic Signature(s) Signed: 12/21/2019 4:25:27 PM By: Worthy Keeler PA-C Entered By: Worthy Keeler on 12/21/2019 08:21:49 [name] [name] ([id number]) -------------------------------------------------------------------------------- Debridement Details Patient Name: [name] [name] Date of Service: 12/21/2019 8:00 AM Medical Record Number: [id number] Patient Account Number: [id number] Date of Birth/Sex: [birth year] (59 y.o. M) Treating RN: Cornell Barman Primary Care Provider: Nobie Putnam Other Clinician: Referring Provider: Nobie Putnam Treating Provider/Extender: Skipper Cliche in Treatment: 33 Debridement Performed for Wound #11 Right,Proximal,Anterior Lower Leg Assessment: Performed By: Physician Tommie Sams., PA-C Debridement Type: Chemical/Enzymatic/Mechanical Agent Used: saline and gauze Severity of Tissue Pre Debridement: Fat layer exposed Level of Consciousness (Pre- Awake and Alert procedure): Pre-procedure Verification/Time Out Yes - 09:04 Taken: Instrument: Other : saline and gauze Bleeding: Minimum Hemostasis Achieved: Pressure Response to Treatment: Procedure was tolerated well Level of Consciousness (Post- Awake and Alert procedure): Post Debridement Measurements of Total Wound Length: (cm) 0.6 Width: (cm) 0.5 Depth: (cm) 0.1 Volume: (cm) 0.024 Character of Wound/Ulcer Post Debridement: Stable Severity  of Tissue Post Debridement: Fat layer exposed Post Procedure Diagnosis Same as Pre-procedure Electronic Signature(s) Signed: 12/21/2019 4:25:27 PM By: Worthy Keeler PA-C Signed: 12/24/2019 6:50:12 PM By: Gretta Cool, BSN, RN, CWS, Kim RN, BSN Entered By: Gretta Cool, BSN, RN, CWS, Kim on 12/21/2019 09:05:27 [name] [name] ([id number]) -------------------------------------------------------------------------------- Debridement Details Patient Name: [name] [name] Date of Service: 12/21/2019 8:00 AM Medical Record Number: [id number] Patient Account Number: [id number] Date of Birth/Sex: 04-Feb-1961 (59 y.o. M) Treating RN: Cornell Barman Primary Care Provider: Nobie Putnam Other Clinician: Referring Provider: Nobie Putnam Treating Provider/Extender: Skipper Cliche in Treatment: 33 Debridement Performed for Wound #10 Right,Distal,Medial Lower Leg Assessment: Performed By: Physician Tommie Sams., PA-C Debridement Type: Chemical/Enzymatic/Mechanical Agent Used: saline and gauze Severity of Tissue Pre Debridement: Fat layer exposed Level of Consciousness (Pre- Awake and Alert procedure): Pre-procedure Verification/Time Out Yes - 09:04 Taken: Instrument: Other : saline and gauze Bleeding: Minimum Hemostasis Achieved: Pressure Response to Treatment: Procedure was tolerated well Level of Consciousness (Post- Awake and Alert procedure): Post Debridement Measurements of Total Wound Length: (cm) 9.2 Width: (cm) 9.4 Depth: (cm) 0.1 Volume: (cm) 6.792 Character of Wound/Ulcer Post Debridement: Stable Severity of Tissue Post Debridement: Fat layer exposed Post Procedure Diagnosis Same as Pre-procedure Electronic Signature(s) Signed: 12/21/2019 4:25:27 PM By: Worthy Keeler PA-C Signed: 12/24/2019 6:50:12 PM By: Gretta Cool, BSN, RN, CWS, Kim RN, BSN Entered By: Gretta Cool, BSN, RN, CWS, Kim on 12/21/2019 09:05:56 [name] [name]  ([id number]) -------------------------------------------------------------------------------- HPI Details Patient Name: [name] [name] Date of Service: 12/21/2019 8:00 AM Medical Record Number: [id number] Patient Account Number: [id number] Date of Birth/Sex: May 19, 1960 (59 y.o. M) Treating RN: Cornell Barman Primary Care Provider: Nobie Putnam Other Clinician: Referring Provider: Nobie Putnam Treating Provider/Extender: Skipper Cliche in Treatment: 70 History of Present Illness HPI Description: 01/18/2019 on evaluation today patient presents for initial evaluation here in our clinic concerning issues he is been having with his bilateral lower extremities. He appears based on what I am seeing to have bilateral lower extremity cellulitis along with chronic venous stasis. He has significant pitting edema even in the bottom of his foot.  With that being said he also has a history of hypertension, COPD, congestive heart failure, long-term use of anticoagulants due to atrial fibrillation which is paroxysmal as well as having had a pulmonary embolism in the past. With that being said he tells me that he has constant drainage out of his left lower extremity. This is likely due to the fact that again he has significant swelling pretty much all the time. With that being said in the past 2 weeks this has been getting worse and he tells me at this point that he feels like it is infected is also hurting a lot more than it has been in the past and the drainage is a different color. The left side is definitely worse than the right. Fortunately there is no signs of active infection systemically although I am concerned due to the severity of the infection that I see in the left lower extremity especially. There is a lot of necrotic tissue on the surface of both wounds that at some point is going to need to be cleaned off to allow these to heal but right now obviously this is much too tender for  him. No fevers, chills, nausea, vomiting, or diarrhea. 02/02/2019 on evaluation today patient presents for follow-up concerning the wounds over his bilateral lower extremities. He is continuing to manage. He was in the hospital and has been discharged at this point. He was admitted from 01/21/2019 through 01/27/2019. This was due to cellulitis of lower extremity. Nonetheless he does seem to be doing much better at this point. I did review his discharge summary as well he did not appear to have any positive findings on blood cultures. His white blood cell count was never really elevated either which is also good news. He was treated with IV clindamycin for 4 days and then switch to complete a 10-day course at home which she is still working on at this point. They did have him on IV Lasix in the hospital which helped him diurese well on discharge they stated that his weight was 408 pounds which is still higher than his dry weight of 375 to 380 pounds. Upon discharge his torsemide was increased from 40 to 60 mg twice daily and he is to follow-up with cardiology as well. 12/29-Patient returns at 2 weeks for evaluation and follow-up of his bilateral lower extremity wounds, with lymphedema, venous ulcerations, patient was recently hospitalized and was aggressively diuresed after which his torsemide has been increased. He follows up with cardiology as well. His left leg stasis wounds are more moist and have maceration of tissue, the right one is less so. We are using silver cell and unna compression given the degree of edema in the legs. Readmission: 05/04/2019 patient presents today for reevaluation here in the clinic I have not seen him since December 2020. Subsequently this is a fairly sick individual with multiple comorbidities who presents for follow-up concerning wounds of his bilateral lower extremities. He tells me that after he was in the hospital following when we saw him last in December that he  ended up subsequently being discharged with home health services and they have been coming out and applying Unna boot wraps. With that being said he has used up all of his home health visits for the year and then for the last week has not had anything on his legs as far as the wrap is concerned. Obviously this is not good and he does come in today with increased swelling or  at least what I perceived to be along with somewhat dry wounds again he does not seem to be weeping too much at this point which is at least good news but he has significant lymphedema/venous stasis. No fevers, chills, nausea, vomiting, or diarrhea. 06/01/2019 upon evaluation today patient is seen after not having been seen actually since 19 2021. He kept coming for appointment with every time he would come his temperature would be elevated past the point where we could see him. I told him that he needed to go be seen at the ER during 1 of these times due to the fact that obviously this was a fever of unknown origin at that point and my concern was that it could potentially be his legs but again I was not entirely sure as obviously I could not see them due to the restrictions secondary to Covid even bringing the patient into the clinic. Nonetheless he finally came in today his temperature again was elevated but not to the same degree that it met the 100.4 or above guidelines of having to cancel the appointment. Either way we did have a look at his legs unfortunately he appears to show signs of cellulitis at this point which is probably where the temperature elevations are emanating from. He has significant lymphedema as well as chronic vent venous stasis as well. When I initially told him that is why I told him to go to the ER previously when his temperature was elevated in case it was something with wounds or otherwise he replied "nobody ever told me to go to the ER". I then subsequently reminded him of me coming to the front desk  due to his temperature being elevated and advising him to go to the ER to which she replied "I am not going to the ER and I do not think I need to even with this". Nonetheless I am concerned about the infection of his legs at this point he has significant weeping, significant edema, and overall is not doing very well in my opinion. 4/23; the patient arrives in clinic today having napkin stuck on his wounds. He is taking the antibiotic from last week. Last Wednesday he had his INR checked at the Coumadin clinic it was apparently "sky high" and his Coumadin is now on hold. He is going back to check this again on Monday. His legs were not put back in compression last week out of fear of the extending infection with fever. He has been taking the antibiotics. Culture from last week showed Enterobacter and methicillin resistant staph aureus. He is on on doxycycline which the MRSA was sensitive to Enterobacter was not plated but given the pattern of sensitivities probably the doxycycline was satisfactory. I will extend the doxycycline another 4 days until he is seen next week. From an infection point of view this looks better 06/14/2019 upon evaluation today patient unfortunately continues to have signs of cellulitis on the lower extremities bilaterally especially on the left. Unfortunately he doesn't seem to be doing much better despite being on doxycycline for a couple of weeks. He was positive for MRSA as well as Enterobacter. Obviously the MRSA is treated by way of the doxycycline the Enterobacter unfortunately is not I'm going to see about initiating Bactrim for him at this point. 5/13; since he was last here he was hospitalized from 5/7 through 5/11. Mostly this was for congestive heart failure. He has an ejection fraction of 25 to 30%. His torsemide dose was increased.  While he was there they dealt with the infection in the right leg. He did not take his Bactrim that he was prescribed last time. He was  treated with linezolid in the hospital and cefepime and discharged on Bactrim to complete a 2-week course. He was supposed to follow-up with the Coumadin clinic at his cardiologist's office [Dr. End). He has not done this. He tells me he is on Coumadino 5 mg twice a day. The last INR I see is from 5/11 when he was discharged from the hospital it was 2.1 however it was increasing every day they check this. He was thought to have cellulitis from methicillin-resistant staph aureus. [name] [name], [name] [name] ([id number]) 5/21; patient's wound on the right lower leg actually looks a lot better although not much change in size. We used Iodoflex under an Haematologist last week. The [employer] stayed in place. 07/20/2019 upon evaluation today patient actually appears to be doing much better in regard to his legs in general. He does not have the infection that was previously noted which is great news he was in the hospital which he finally did go after things got significantly worse. Fortunately he seems to be doing much better in regard to his overall appearance today in that regard. With that being said he still is experiencing issues currently with wounds over the lower extremities and his wraps do tend to slip down. We probably do need to use the Unna paste which I think we are doing here but I am not sure the home health nurses are using right now we may need to send this order along as well. 07/27/2019 upon evaluation today patient appears to be doing excellent in regard to his lower extremities all things considered. He still has significant edema noted but the compression wraps do seem to be helping and overall after getting his infection under good control I feel like things have improved dramatically. There does not appear to be any signs of active infection at this time. No fevers, chills, nausea, vomiting, or diarrhea. Readmission: 10/30/2019 patient presents today for readmission here in the clinic concerning  issues that he has been having with his bilateral lower extremities. He tells me after last time he was in the hospital he actually had improvement of his legs to everything got under much better control was actually doing quite well. Nonetheless right now he has blisters that reopened he tells me about a month ago and has not really been able to get them close. He does not have any compression stockings that he is wearing at this point he came in really been nothing on the wounds currently. 11/15/2019 on evaluation today patient appears to be doing well with regard to his leg ulcers. He has been tolerating the dressing changes without complication. Fortunately there is no evidence of active infection at this time. 11/22/2019 on evaluation today patient actually is making excellent improvement in regard to his legs at this point. I am actually very pleased with the overall way that he is progressing this go around there is no signs of infection and in general I think he is doing what he needs to to see these wounds heal. 12/21/2019 upon evaluation today patient appears to be doing poorly in regard to his right leg. Unfortunately he is not doing as well simply due to the fact that he left the compression wrap we put on him October 13 on until today. He states that he has had a bad chest cold  and was not able to come in to be seen. With that being said my concern at this point is that he often does not make his appointments. This leads to issues such as this and for today until he can show me that he comes on a regular basis I am not can be willing to rewrap his leg despite the fact his right leg really does need to be rewrapped today. He voiced understanding although he did not seem to be happy about it but again as I explained to him it is dangerous for him to leave the wrap on for 3 to 4 weeks and it actually caused damage to his leg as opposed to helping anyway because it it slid down he did not even  take it off in that regard. This makes me reluctant to proceed with wrapping until I can get better compliance from him. Electronic Signature(s) Signed: 12/21/2019 3:36:40 PM By: Worthy Keeler PA-C Entered By: Worthy Keeler on 12/21/2019 15:36:40 [name] [name], [name] [name] ([id number]) -------------------------------------------------------------------------------- Physical Exam Details Patient Name: [name] [name] Date of Service: 12/21/2019 8:00 AM Medical Record Number: [id number] Patient Account Number: [id number] Date of Birth/Sex: Jun 01, 1960 (59 y.o. M) Treating RN: Cornell Barman Primary Care Provider: Nobie Putnam Other Clinician: Referring Provider: Nobie Putnam Treating Provider/Extender: Skipper Cliche in Treatment: 80 Constitutional Well-nourished and well-hydrated in no acute distress. Respiratory normal breathing without difficulty. Psychiatric this patient is able to make decisions and demonstrates good insight into disease process. Alert and Oriented x 3. pleasant and cooperative. Notes Upon inspection patient's wound bed actually showed signs of good epithelization on the left leg there is no signs of active infection or open wounds at this point. With regard to the right leg this was significantly worse more swollen and the wrap had constricted around his ankle region due to the fact that he allow this to slide down and kept it on for the entirety of the time from 13 October till today. Obviously this is dangerous for him. Electronic Signature(s) Signed: 12/21/2019 3:37:14 PM By: Worthy Keeler PA-C Previous Signature: 12/21/2019 3:36:55 PM Version By: Worthy Keeler PA-C Entered By: Worthy Keeler on 12/21/2019 15:37:13 [name] [name] ([id number]) -------------------------------------------------------------------------------- Physician Orders Details Patient Name: [name] [name] Date of Service: 12/21/2019 8:00 AM Medical Record Number:  [id number] Patient Account Number: [id number] Date of Birth/Sex: [birth year] (59 y.o. M) Treating RN: Cornell Barman Primary Care Provider: Nobie Putnam Other Clinician: Referring Provider: Nobie Putnam Treating Provider/Extender: Skipper Cliche in Treatment: 3 Verbal / Phone Orders: No Diagnosis Coding ICD-10 Coding Code Description I87.2 Venous insufficiency (chronic) (peripheral) I89.0 Lymphedema, not elsewhere classified L97.812 Non-pressure chronic ulcer of other part of right lower leg with fat layer exposed L97.822 Non-pressure chronic ulcer of other part of left lower leg with fat layer exposed [place] (primary) hypertension J44.9 Chronic obstructive pulmonary disease, unspecified Z79.01 Long term (current) use of anticoagulants I48.0 Paroxysmal atrial fibrillation Z86.711 Personal history of pulmonary embolism 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 o Dial antibacterial soap, wash wounds, rinse and pat dry prior to dressing wounds Primary Wound Dressing Wound #10 Right,Distal,Medial Lower Leg o Silver Alginate Secondary Dressing Wound #10 Right,Distal,Medial Lower Leg o XtraSorb Dressing Change Frequency Wound #10 Right,Distal,Medial Lower Leg o Dressing is to be changed Monday and Thursday. - Patient's sister in law to change at  home. Follow-up Appointments Wound #10 Right,Distal,Medial Lower Leg o Return Appointment in 1 week. Edema Control Wound #10 Right,Distal,Medial Lower Leg o Other: - Tubi-grip G bilaterally Medications-please add to medication list. Wound #10 Right,Distal,Medial Lower Leg o P.O. Antibiotics Wound #11 Right,Proximal,Anterior Lower Leg o P.O. Antibiotics Laboratory [name] [name], [name] [name] ([id number]) o Bacteria  identified in Wound by Culture (MICRO) - Right leg oooo LOINC Code: 6578-4 oooo Convenience Name: Wound culture routine Patient Medications Allergies: No Known Drug Allergies Notifications Medication Indication Start End doxycycline hyclate 12/21/2019 DOSE 1 - oral 100 mg capsule - 1 capsule oral taken 2 times per day for 14 days. Do not take iron and call your coumadin clinic before starting this medication Electronic Signature(s) Signed: 12/21/2019 9:18:31 AM By: Worthy Keeler PA-C Entered By: Worthy Keeler on 12/21/2019 09:18:30 [name] [name] ([id number]) -------------------------------------------------------------------------------- Problem List Details Patient Name: [name] [name] Date of Service: 12/21/2019 8:00 AM Medical Record Number: [id number] Patient Account Number: [id number] Date of Birth/Sex: [birth year] (59 y.o. M) Treating RN: Cornell Barman Primary Care Provider: Nobie Putnam Other Clinician: Referring Provider: Nobie Putnam Treating Provider/Extender: Skipper Cliche in Treatment: 57 Active Problems ICD-10 Encounter Code Description Active Date MDM Diagnosis I87.2 Venous insufficiency (chronic) (peripheral) 05/04/2019 No Yes I89.0 Lymphedema, not elsewhere classified 05/04/2019 No Yes L97.812 Non-pressure chronic ulcer of other part of right lower leg with fat layer 05/04/2019 No Yes exposed L97.822 Non-pressure chronic ulcer of other part of left lower leg with fat layer 05/04/2019 No Yes exposed [place] (primary) hypertension 05/04/2019 No Yes J44.9 Chronic obstructive pulmonary disease, unspecified 05/04/2019 No Yes Z79.01 Long term (current) use of anticoagulants 05/04/2019 No Yes I48.0 Paroxysmal atrial fibrillation 05/04/2019 No Yes Z86.711 Personal history of pulmonary embolism 05/04/2019 No Yes I50.42 Chronic combined systolic (congestive) and diastolic (congestive) heart 05/04/2019 No Yes failure E11.622 Type 2 diabetes mellitus  with other skin ulcer 10/30/2019 No Yes Inactive Problems Resolved Problems ULRIC, SALZMAN ([id number]) Electronic Signature(s) Signed: 12/21/2019 4:25:27 PM By: Worthy Keeler PA-C Entered By: Worthy Keeler on 12/21/2019 08:21:39 [name] [name] ([id number]) -------------------------------------------------------------------------------- Progress Note Details Patient Name: [name] [name] Date of Service: 12/21/2019 8:00 AM Medical Record Number: [id number] Patient Account Number: [id number] Date of Birth/Sex: 10-16-1960 (59 y.o. M) Treating RN: Cornell Barman Primary Care Provider: Nobie Putnam Other Clinician: Referring Provider: Nobie Putnam Treating Provider/Extender: Skipper Cliche in Treatment: 23 Subjective Chief Complaint Information obtained from Patient Bilateral LE ulcers History of Present Illness (HPI) 01/18/2019 on evaluation today patient presents for initial evaluation here in our clinic concerning issues he is been having with his bilateral lower extremities. He appears based on what I am seeing to have bilateral lower extremity cellulitis along with chronic venous stasis. He has significant pitting edema even in the bottom of his foot. With that being said he also has a history of hypertension, COPD, congestive heart failure, long-term use of anticoagulants due to atrial fibrillation which is paroxysmal as well as having had a pulmonary embolism in the past. With that being said he tells me that he has constant drainage out of his left lower extremity. This is likely due to the fact that again he has significant swelling pretty much all the time. With that being said in the past 2 weeks this has been getting worse and he tells me at this point that he feels like it is infected is also hurting a lot more than it has been in the past and the drainage is a different color. The left side  is definitely worse than the right. Fortunately there is no signs  of active infection systemically although I am concerned due to the severity of the infection that I see in the left lower extremity especially. There is a lot of necrotic tissue on the surface of both wounds that at some point is going to need to be cleaned off to allow these to heal but right now obviously this is much too tender for him. No fevers, chills, nausea, vomiting, or diarrhea. 02/02/2019 on evaluation today patient presents for follow-up concerning the wounds over his bilateral lower extremities. He is continuing to manage. He was in the hospital and has been discharged at this point. He was admitted from 01/21/2019 through 01/27/2019. This was due to cellulitis of lower extremity. Nonetheless he does seem to be doing much better at this point. I did review his discharge summary as well he did not appear to have any positive findings on blood cultures. His white blood cell count was never really elevated either which is also good news. He was treated with IV clindamycin for 4 days and then switch to complete a 10-day course at home which she is still working on at this point. They did have him on IV Lasix in the hospital which helped him diurese well on discharge they stated that his weight was 408 pounds which is still higher than his dry weight of 375 to 380 pounds. Upon discharge his torsemide was increased from 40 to 60 mg twice daily and he is to follow-up with cardiology as well. 12/29-Patient returns at 2 weeks for evaluation and follow-up of his bilateral lower extremity wounds, with lymphedema, venous ulcerations, patient was recently hospitalized and was aggressively diuresed after which his torsemide has been increased. He follows up with cardiology as well. His left leg stasis wounds are more moist and have maceration of tissue, the right one is less so. We are using silver cell and unna compression given the degree of edema in the legs. Readmission: 05/04/2019 patient presents  today for reevaluation here in the clinic I have not seen him since December 2020. Subsequently this is a fairly sick individual with multiple comorbidities who presents for follow-up concerning wounds of his bilateral lower extremities. He tells me that after he was in the hospital following when we saw him last in December that he ended up subsequently being discharged with home health services and they have been coming out and applying Unna boot wraps. With that being said he has used up all of his home health visits for the year and then for the last week has not had anything on his legs as far as the wrap is concerned. Obviously this is not good and he does come in today with increased swelling or at least what I perceived to be along with somewhat dry wounds again he does not seem to be weeping too much at this point which is at least good news but he has significant lymphedema/venous stasis. No fevers, chills, nausea, vomiting, or diarrhea. 06/01/2019 upon evaluation today patient is seen after not having been seen actually since 19 2021. He kept coming for appointment with every time he would come his temperature would be elevated past the point where we could see him. I told him that he needed to go be seen at the ER during 1 of these times due to the fact that obviously this was a fever of unknown origin at that point and my concern was that it  could potentially be his legs but again I was not entirely sure as obviously I could not see them due to the restrictions secondary to Covid even bringing the patient into the clinic. Nonetheless he finally came in today his temperature again was elevated but not to the same degree that it met the 100.4 or above guidelines of having to cancel the appointment. Either way we did have a look at his legs unfortunately he appears to show signs of cellulitis at this point which is probably where the temperature elevations are emanating from. He has significant  lymphedema as well as chronic vent venous stasis as well. When I initially told him that is why I told him to go to the ER previously when his temperature was elevated in case it was something with wounds or otherwise he replied "nobody ever told me to go to the ER". I then subsequently reminded him of me coming to the front desk due to his temperature being elevated and advising him to go to the ER to which she replied "I am not going to the ER and I do not think I need to even with this". Nonetheless I am concerned about the infection of his legs at this point he has significant weeping, significant edema, and overall is not doing very well in my opinion. 4/23; the patient arrives in clinic today having napkin stuck on his wounds. He is taking the antibiotic from last week. Last Wednesday he had his INR checked at the Coumadin clinic it was apparently "sky high" and his Coumadin is now on hold. He is going back to check this again on Monday. His legs were not put back in compression last week out of fear of the extending infection with fever. He has been taking the antibiotics. Culture from last week showed Enterobacter and methicillin resistant staph aureus. He is on on doxycycline which the MRSA was sensitive to Enterobacter was not plated but given the pattern of sensitivities probably the doxycycline was satisfactory. I will extend the doxycycline another 4 days until he is seen next week. From an infection point of view this looks better 06/14/2019 upon evaluation today patient unfortunately continues to have signs of cellulitis on the lower extremities bilaterally especially on the left. Unfortunately he doesn't seem to be doing much better despite being on doxycycline for a couple of weeks. He was positive for MRSA as well as Enterobacter. Obviously the MRSA is treated by way of the doxycycline the Enterobacter unfortunately is not I'm going to see about initiating Bactrim for him at this  point. 5/13; since he was last here he was hospitalized from 5/7 through 5/11. Mostly this was for congestive heart failure. He has an ejection fraction of [name] [name], [name] [name] ([id number]) 25 to 30%. His torsemide dose was increased. While he was there they dealt with the infection in the right leg. He did not take his Bactrim that he was prescribed last time. He was treated with linezolid in the hospital and cefepime and discharged on Bactrim to complete a 2-week course. He was supposed to follow-up with the Coumadin clinic at his cardiologist's office [Dr. End). He has not done this. He tells me he is on Coumadino 5 mg twice a day. The last INR I see is from 5/11 when he was discharged from the hospital it was 2.1 however it was increasing every day they check this. He was thought to have cellulitis from methicillin-resistant staph aureus. 5/21; patient's wound on the right lower  leg actually looks a lot better although not much change in size. We used Iodoflex under an Haematologist last week. The [employer] stayed in place. 07/20/2019 upon evaluation today patient actually appears to be doing much better in regard to his legs in general. He does not have the infection that was previously noted which is great news he was in the hospital which he finally did go after things got significantly worse. Fortunately he seems to be doing much better in regard to his overall appearance today in that regard. With that being said he still is experiencing issues currently with wounds over the lower extremities and his wraps do tend to slip down. We probably do need to use the Unna paste which I think we are doing here but I am not sure the home health nurses are using right now we may need to send this order along as well. 07/27/2019 upon evaluation today patient appears to be doing excellent in regard to his lower extremities all things considered. He still has significant edema noted but the compression wraps do seem  to be helping and overall after getting his infection under good control I feel like things have improved dramatically. There does not appear to be any signs of active infection at this time. No fevers, chills, nausea, vomiting, or diarrhea. Readmission: 10/30/2019 patient presents today for readmission here in the clinic concerning issues that he has been having with his bilateral lower extremities. He tells me after last time he was in the hospital he actually had improvement of his legs to everything got under much better control was actually doing quite well. Nonetheless right now he has blisters that reopened he tells me about a month ago and has not really been able to get them close. He does not have any compression stockings that he is wearing at this point he came in really been nothing on the wounds currently. 11/15/2019 on evaluation today patient appears to be doing well with regard to his leg ulcers. He has been tolerating the dressing changes without complication. Fortunately there is no evidence of active infection at this time. 11/22/2019 on evaluation today patient actually is making excellent improvement in regard to his legs at this point. I am actually very pleased with the overall way that he is progressing this go around there is no signs of infection and in general I think he is doing what he needs to to see these wounds heal. 12/21/2019 upon evaluation today patient appears to be doing poorly in regard to his right leg. Unfortunately he is not doing as well simply due to the fact that he left the compression wrap we put on him October 13 on until today. He states that he has had a bad chest cold and was not able to come in to be seen. With that being said my concern at this point is that he often does not make his appointments. This leads to issues such as this and for today until he can show me that he comes on a regular basis I am not can be willing to rewrap his leg despite the  fact his right leg really does need to be rewrapped today. He voiced understanding although he did not seem to be happy about it but again as I explained to him it is dangerous for him to leave the wrap on for 3 to 4 weeks and it actually caused damage to his leg as opposed to helping anyway because it  it slid down he did not even take it off in that regard. This makes me reluctant to proceed with wrapping until I can get better compliance from him. Objective Constitutional Well-nourished and well-hydrated in no acute distress. Vitals Time Taken: 8:15 AM, Height: 68 in, Weight: 400 lbs, BMI: 60.8, Temperature: 98.5 F, Pulse: 105 bpm, Respiratory Rate: 22 breaths/min, Blood Pressure: 144/101 mmHg. Respiratory normal breathing without difficulty. Psychiatric this patient is able to make decisions and demonstrates good insight into disease process. Alert and Oriented x 3. pleasant and cooperative. General Notes: Upon inspection patient's wound bed actually showed signs of good epithelization on the left leg there is no signs of active infection or open wounds at this point. With regard to the right leg this was significantly worse more swollen and the wrap had constricted around his ankle region due to the fact that he allow this to slide down and kept it on for the entirety of the time from 13 October till today. Obviously this is dangerous for him. Integumentary (Hair, Skin) Wound #10 status is Open. Original cause of wound was Gradually Appeared. The wound is located on the Right,Distal,Medial Lower Leg. The wound measures 9.2cm length x 9.4cm width x 0.1cm depth; 67.921cm^2 area and 6.792cm^3 volume. There is Fat Layer (Subcutaneous Tissue) exposed. There is no tunneling or undermining noted. There is a large amount of serosanguineous drainage noted. There is no granulation within the wound bed. There is no necrotic tissue within the wound bed. Wound #11 status is Open. Original cause of  wound was Gradually Appeared. The wound is located on the Right,Proximal,Anterior Lower Leg. The wound measures 0.6cm length x 0.5cm width x 0.1cm depth; 0.236cm^2 area and 0.024cm^3 volume. There is Fat Layer (Subcutaneous Tissue) [name] [name], [name] [name] ([id number]) exposed. There is no tunneling or undermining noted. There is a medium amount of serosanguineous drainage noted. There is large (67-100%) red granulation within the wound bed. There is no necrotic tissue within the wound bed. Wound #5 status is Healed - Epithelialized. Original cause of wound was Gradually Appeared. The wound is located on the Left,Lateral Lower Leg. The wound measures 0cm length x 0cm width x 0cm depth; 0cm^2 area and 0cm^3 volume. There is Fat Layer (Subcutaneous Tissue) exposed. There is no tunneling or undermining noted. There is a medium amount of serous drainage noted. The wound margin is flat and intact. There is medium (34-66%) pink granulation within the wound bed. There is a medium (34-66%) amount of necrotic tissue within the wound bed including Adherent Slough. 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 There was a Chemical/Enzymatic/Mechanical debridement performed by Tommie Sams., PA-C. With the following instrument (s): saline and gauze. Other agent used was saline and gauze. A time out was conducted at 09:04, prior to the start of the procedure. A Minimum amount of bleeding was controlled with Pressure. The procedure was tolerated well. Post Debridement Measurements: 9.2cm  length x 9.4cm width x 0.1cm depth; 6.792cm^3 volume. Character of Wound/Ulcer Post Debridement is stable. Severity of Tissue Post Debridement is: Fat layer exposed. Post procedure Diagnosis Wound #10: Same as Pre-Procedure Wound #11 Pre-procedure diagnosis of Wound #11 is a Lymphedema located on the Right,Proximal,Anterior Lower Leg .Severity of Tissue Pre Debridement is: Fat layer exposed. There was a Chemical/Enzymatic/Mechanical debridement performed by Tommie Sams., PA-C. With the following instrument (s): saline and gauze. Other agent used was saline and gauze. A time out was conducted at 09:04, prior to the start of the procedure. A Minimum amount of bleeding was controlled with Pressure. The procedure was tolerated well. Post Debridement Measurements: 0.6cm length x 0.5cm width x 0.1cm depth; 0.024cm^3 volume. Character of Wound/Ulcer Post Debridement is stable. Severity of Tissue Post Debridement is: Fat layer exposed. Post procedure Diagnosis Wound #11: Same as Pre-Procedure Plan Wound Cleansing: Wound #10 Right,Distal,Medial Lower Leg: Clean wound with Normal Saline. Dial antibacterial soap, wash wounds, rinse and pat dry prior to dressing wounds Primary Wound Dressing: Wound #10 Right,Distal,Medial Lower Leg: Silver Alginate Secondary Dressing: Wound #10 Right,Distal,Medial Lower Leg: XtraSorb Dressing Change Frequency: Wound #10 Right,Distal,Medial Lower Leg: Dressing is to be changed Monday and Thursday. - Patient's sister in law to change at home. [name] [name], [name] [name] ([id number]) Follow-up Appointments: Wound #10 Right,Distal,Medial Lower Leg: Return Appointment in 1  week. Edema Control: Wound #10 Right,Distal,Medial Lower Leg: Other: - Tubi-grip G bilaterally Medications-please add to medication list.: Wound #10 Right,Distal,Medial Lower Leg: P.O. Antibiotics Wound #11 Right,Proximal,Anterior Lower Leg: P.O. Antibiotics Laboratory ordered were: Wound culture routine - Right leg The following medication(s) was prescribed: doxycycline hyclate oral 100 mg capsule 1 1 capsule oral taken 2 times per day for 14 days. Do not take iron and call your coumadin clinic before starting this medication starting 12/21/2019 1. Would recommend at this time that we go ahead and initiate treatment with a continuation of the silver alginate with XtraSorb to cover for the right leg I think this is the right thing to do. With that being said I am not going to utilize a compression wrap on him we will just use Tubigrip for the time being simply due to the fact that I cannot trust him to make his follow-up appointments and not leave wrap on for 3 to 4 weeks which is dangerous. 2. I am also can recommend that we go ahead and culture the wound which I did send in today for him. Subsequently we will see what the results of the culture show. In the meantime I am going to place him on doxycycline he does need to contact his Coumadin clinic to let them know he will be taking this really any medicine I was looking at from an antibiotic standpoint would have had the same effect as far as his Coumadin was concerned. For that reason I chose doxycycline as it has no issues with his potassium levels in regard to interaction with his other medications. He was told verbally to contact the Coumadin clinic and also put this on the prescription itself hopefully he will contact them before starting as advised. 3. I would recommend he needs to elevate his legs much as possible obviously the Tubigrip will not control the edema as well as a compression wrap would but nonetheless right now I just  cannot risk put him in a compression wrap. We will see patient back for reevaluation in 1 week here in the clinic. If anything worsens or changes patient will contact  our office for additional recommendations. Electronic Signature(s) Signed: 12/21/2019 3:38:58 PM By: Worthy Keeler PA-C Entered By: Worthy Keeler on 12/21/2019 15:38:58 [name] [name], [name] [name] ([id number]) -------------------------------------------------------------------------------- SuperBill Details Patient Name: [name] [name] Date of Service: 12/21/2019 Medical Record Number: [id number] Patient Account Number: [id number] Date of Birth/Sex: Jun 26, 1960 (59 y.o. M) Treating RN: Cornell Barman Primary Care Provider: Nobie Putnam Other Clinician: Referring Provider: Nobie Putnam Treating Provider/Extender: Skipper Cliche in Treatment: 33 Diagnosis Coding ICD-10 Codes Code Description I87.2 Venous insufficiency (chronic) (peripheral) I89.0 Lymphedema, not elsewhere classified L97.812 Non-pressure chronic ulcer of other part of right lower leg with fat layer exposed L97.822 Non-pressure chronic ulcer of other part of left lower leg with fat layer exposed [place] (primary) hypertension J44.9 Chronic obstructive pulmonary disease, unspecified Z79.01 Long term (current) use of anticoagulants I48.0 Paroxysmal atrial fibrillation Z86.711 Personal history of pulmonary embolism I50.42 Chronic combined systolic (congestive) and diastolic (congestive) heart failure E11.622 Type 2 diabetes mellitus with other skin ulcer Facility Procedures CPT4 Code: [id number] Description: 99214 - WOUND CARE VISIT-LEV 4 EST PT Modifier: Quantity: 1 Physician Procedures CPT4 Code: [id number] Description: 99214 - WC PHYS LEVEL 4 - EST PT Modifier: Quantity: 1 CPT4 Code: Description: ICD-10 Diagnosis Description I87.2 Venous insufficiency (chronic) (peripheral) I89.0 Lymphedema, not elsewhere classified L97.812 Non-pressure  chronic ulcer of other part of right lower leg with fat la L97.822 Non-pressure chronic ulcer of  other part of left lower leg with fat lay Modifier: yer exposed er exposed Quantity: Notes Patient came into today with infection and a new wound. Electronic Signature(s) Signed: 12/21/2019 3:39:31 PM By: Worthy Keeler PA-C Previous Signature: 12/21/2019 3:39:17 PM Version By: Worthy Keeler PA-C Entered By: Worthy Keeler on 12/21/2019 15:39:30

## 2019-12-25 NOTE — Progress Notes (Addendum)
PROGRESS NOTE    [name] [name]   POE:[id number]  DOB: September 24, 1960  PCP: [name], DO    DOA: 12/22/2019 LOS: 2   Brief Narrative   [name] [name] is a 59 y.o. 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  Presented to the ED on 12/22/19 with progressive shortness of breath since day before.  Reported associated fever/chills, clammy skin, general malaise. Reported fever of 101 F at home.   Placed on BiPAP in the ED.  Evaluation revealed left lower lobe pneumonia seen on CTA chest (acute PE ruled out) with concurrent COPD exacerbation, and RLE cellulitis.     Assessment & Plan   Principal Problem:   Sepsis ([place]) Active Problems:   Cellulitis of right lower extremity   Community acquired pneumonia   Acute on chronic respiratory failure with hypoxia (HCC)   OSA (obstructive sleep apnea)   HTN (hypertension)   Morbid obesity with BMI of 60.0-69.9, adult (HCC)   Chest pain   HLD (hyperlipidemia)   CAD (coronary artery disease)   Hypothyroidism   Degenerative joint disease of right hip   Chronic anticoagulation (warfarin  COUMADIN)   Shortness of breath   Atrial fibrillation, chronic (HCC)   Class 3 obesity with alveolar hypoventilation, serious comorbidity, and body mass index (BMI) of 60.0 to 69.9 in adult [facility])   Acute on chronic respiratory failure with hypoxia due to Community-acquired Pneumonia - present on admission.  Presented in respiratory distress requiring BiPAP.   CTA ruled out PE but showed LLL infiltrate consistent with pneumonia.  Chart review shows treated with Prednisone and Levaquin in mid-Oct for sinusitis and mild COPD exacerbation.   --Continue Cefepime and Zithromax --Scheduled Mucinex --IS and  flutter --Tylenol PRN fever --Supplemental O2 as needed, maintain O2 sat > 88%, wean as tolerated --follow blood & sputum cultures  Sepsis - due to CA-PNA and RLE cellulitis.  Met SIRS criteria on admission with tachycardia, tachypnea, symptoms and imaging consistent with pneumonia, and with concurrent RLE cellulitis.  No evidence of organ dysfunction.  Mgmt as above.    Right lower extremity cellulitis secondary to chronic wound - POA. Pt has chronic lymphedema and presented with cellulitis.  Follows at wound care clinic, had cultures from wound taken on 11/5 which resulted positive for MRSA since admission. On Cefepime as above.  11/8 Vancomycin added  --Continue Vanc & Cefepime --WOC consulted  Shortness of breath - due to PNA.  COPD stable, patient without wheezing and good aeration  Mgmt as above.  Improving.  Headache - Tylenol or Fioricet PRN  Dizziness - description sounds like vertigo with sensation of room spinning and associated nausea.  Trial of meclizine.  Chronic pain - due to several traumatic injuries over his lifetime and OA.  PRN Norco ordered  Bilateral knee OA / Knee Pain - improved with topical Voltaren gel - continue.  Oral analgesics PRN  COPD - seems stable, no wheezing on exam and good aeration.  Given IV steroids in the ED, but not wheezing on exam today, will defer additional steroids as acute respiratory illness likely due to PNA. Monitor closely for wheezing, and consider resume steroids.   Resumed on home regimen.  Breo was added on admission.  Scheduled Duonebs.  Chronic diastolic CHF - seems compensated with baseline edema per  patient.   --Continue metoprolol --Continue torsemide  --Monitor renal function and electrolytes, volume status  Chest pain - POA, Resolved.  Had very mild troponin elevation on presentation 41>>19.   Chest pain has resolved, no acute ischemic ECG changes.  Likely demand ischemia secondary to PNA with hypoxia.   Monitor.  Coronary artery disease - continue ASA, statin  Essential Hypertension - continue lisinopril, Toprol-XL  OSA (obstructive sleep apnea) - CPAP ordered  Chronic anticoagulation (warfarin  COUMADIN) - for hx of PE's.  Pharmacy consulted. Continue warfarin. Reported on admission, has not been followed recently for [facility] outpatient.  Atrial fibrillation, chronic - rate controlled.  Continue warfarin, Toprol-XL  Hyperlipidemia - continue Lipitor  Gout - not acutely flared. Continue allopurinol.  Insomnia - trazodone  Hypothyroidism - levothyroxine  Morbid Obesity: Body mass index is 62.34 kg/m.  Complicates overall care and prognosis.  Hx of TBI - age 79, with resulting left-sided deafness.    Depression/Anxiety - Cymbalta   DVT prophylaxis: Place TED hose Start: 12/22/19 1628 warfarin (COUMADIN) tablet 7.5 mg   Diet:  Diet Orders (From admission, onward)    Start     Ordered   12/22/19 1746  Diet 2 gram sodium Room service appropriate? Yes; Fluid consistency: Thin  Diet effective now       Question Answer Comment  Room service appropriate? Yes   Fluid consistency: Thin      12/22/19 1747            Code Status: Partial Code    Subjective 12/25/19    Pt seen up in chair today. Reports headache again, and ongoing hot/cold spells.  Breathing much better, but still very SOB on exertion.   Disposition Plan & Communication   Status is: Inpatient  Remains inpatient appropriate because:IV treatments appropriate due to intensity of illness or inability to take PO, oxygen requirements above baseline, on IV antibiotics   Dispo: The patient is from: Home              Anticipated d/c is to: Home              Anticipated d/c date is: 2-3 days              Patient currently is not medically stable to d/c.    Family Communication: none at bedside, will attempt to call    Consults, Procedures, Significant Events   Consultants:   none  Procedures:    none  Antimicrobials:  Anti-infectives (From admission, onward)   Start     Dose/Rate Route Frequency Ordered Stop   12/25/19 0600  vancomycin (VANCOREADY) IVPB 1500 mg/300 mL        1,500 mg 150 mL/hr over 120 Minutes Intravenous Every 12 hours 12/24/19 1326     12/24/19 1600  vancomycin (VANCOREADY) IVPB 2000 mg/400 mL        2,000 mg 200 mL/hr over 120 Minutes Intravenous  Once 12/24/19 1326 12/24/19 2239   12/24/19 1200  cefTRIAXone (ROCEPHIN) 2 g in sodium chloride 0.9 % 100 mL IVPB       Note to Pharmacy: Adjust start time accordingly based on last cefepime given   2 g 200 mL/hr over 30 Minutes Intravenous Every 24 hours 12/24/19 0752     12/23/19 1000  cefTRIAXone (ROCEPHIN) 1 g in sodium chloride 0.9 % 100 mL IVPB  Status:  Discontinued        1 g 200 mL/hr over 30 Minutes Intravenous Every 24 hours 12/23/19  0001 12/23/19 0002   12/22/19 2200  azithromycin (ZITHROMAX) 500 mg in sodium chloride 0.9 % 250 mL IVPB        500 mg 250 mL/hr over 60 Minutes Intravenous Every 24 hours 12/22/19 2115 12/25/19 0124   12/22/19 1645  ceFEPIme (MAXIPIME) 2 g in sodium chloride 0.9 % 100 mL IVPB  Status:  Discontinued        2 g 200 mL/hr over 30 Minutes Intravenous Every 8 hours 12/22/19 1631 12/24/19 0752       Objective   Vitals:   12/25/19 0422 12/25/19 0736 12/25/19 0908 12/25/19 1143  BP: 103/88  114/70 115/77  Pulse: 99 76 71 71  Resp: 20 20    Temp: 97.6 F (36.4 C)  98.3 F (36.8 C) 98.3 F (36.8 C)  TempSrc: Oral  Oral Oral  SpO2: 93% 93% 96% 98%  Weight:        Intake/Output Summary (Last 24 hours) at 12/25/2019 1540 Last data filed at 12/25/2019 1100 Gross per 24 hour  Intake 460 ml  Output 6500 ml  Net -6040 ml   Filed Weights   12/22/19 1301  Weight: (!) 186 kg    Physical Exam:  General exam: awake, alert, no acute distress, up in chair, morbidly obese Respiratory system: CTAB with diminished bases, no wheezes, normal respiratory  effort Cardiovascular system: normal S1/S2, RRR, distant heart sounds  Central nervous system: A&O x3. no gross focal neurologic deficits, normal speech Extremities: significant BLE edema, RLE wound with surrounding erythema without significant differential warmth to touch.      Labs   Data Reviewed: I have personally reviewed following labs and imaging studies  CBC: Recent Labs  Lab 12/22/19 1259 12/23/19 0537  WBC 9.3 7.2  HGB 13.1 13.2  HCT 41.2 40.5  MCV 93.4 91.4  PLT 219 403   Basic Metabolic Panel: Recent Labs  Lab 12/22/19 1259 12/23/19 0537 12/24/19 0423 12/25/19 0519  NA 137 135 137 134*  K 3.7 4.2 3.9 3.9  CL 96* 98 96* 88*  CO2 30 29 34* 33*  GLUCOSE 160* 171* 128* 155*  BUN 11 15 22* 29*  CREATININE 0.82 0.87 0.96 1.02  CALCIUM 7.9* 8.5* 8.7* 8.7*  MG  --   --  1.8  --    GFR: Estimated Creatinine Clearance: 127.3 mL/min (by C-G formula based on SCr of 1.02 mg/dL). Liver Function Tests: Recent Labs  Lab 12/22/19 1259  AST 35  ALT 30  ALKPHOS 75  BILITOT 1.5*  PROT 6.5  ALBUMIN 3.1*   No results for input(s): LIPASE, AMYLASE in the last 168 hours. No results for input(s): AMMONIA in the last 168 hours. Coagulation Profile: Recent Labs  Lab 12/22/19 1259 12/23/19 0537 12/24/19 0423 12/25/19 0519  INR 1.3* 1.2 1.8* 2.0*   Cardiac Enzymes: No results for input(s): CKTOTAL, CKMB, CKMBINDEX, TROPONINI in the last 168 hours. BNP (last 3 results) No results for input(s): PROBNP in the last 8760 hours. HbA1C: No results for input(s): HGBA1C in the last 72 hours. CBG: No results for input(s): GLUCAP in the last 168 hours. Lipid Profile: No results for input(s): CHOL, HDL, LDLCALC, TRIG, CHOLHDL, LDLDIRECT in the last 72 hours. Thyroid Function Tests: No results for input(s): TSH, T4TOTAL, FREET4, T3FREE, THYROIDAB in the last 72 hours. Anemia Panel: No results for input(s): VITAMINB12, FOLATE, FERRITIN, TIBC, IRON, RETICCTPCT in the last  72 hours. Sepsis Labs: No results for input(s): PROCALCITON, LATICACIDVEN in the last 168 hours.  Recent Results (from the past 240 hour(s))  Aerobic Culture (superficial specimen)     Status: None   Collection Time: 12/21/19  8:58 AM   Specimen: Wound  Result Value Ref Range Status   Specimen Description   Final    WOUND Performed at [facility], [address], [place], [place] [address]    Special Requests RIGHT LEG  Final   Gram Stain   Final    NO WBC SEEN ABUNDANT GRAM POSITIVE COCCI MODERATE GRAM POSITIVE RODS RARE GRAM NEGATIVE RODS Performed at [facility], [address] [place], [place] [address]    Culture   Final    ABUNDANT METHICILLIN RESISTANT STAPHYLOCOCCUS AUREUS RARE FLAVOBACTERIUM [place]    Report Status 12/25/2019 FINAL  Final   Organism ID, Bacteria METHICILLIN RESISTANT STAPHYLOCOCCUS AUREUS  Final   Organism ID, Bacteria FLAVOBACTERIUM ODORATUM  Final      Susceptibility   Flavobacterium odoratum - MIC*    CEFAZOLIN >=64 RESISTANT Resistant     CEFTAZIDIME >=64 RESISTANT Resistant     CIPROFLOXACIN >=4 RESISTANT Resistant     GENTAMICIN >=16 RESISTANT Resistant     IMIPENEM 2 SENSITIVE Sensitive     TRIMETH/SULFA >=320 RESISTANT Resistant     PIP/TAZO >=128 RESISTANT Resistant     * RARE FLAVOBACTERIUM ODORATUM   Methicillin resistant staphylococcus aureus - MIC*    CIPROFLOXACIN >=8 RESISTANT Resistant     ERYTHROMYCIN >=8 RESISTANT Resistant     GENTAMICIN <=0.5 SENSITIVE Sensitive     OXACILLIN >=4 RESISTANT Resistant     TETRACYCLINE <=1 SENSITIVE Sensitive     VANCOMYCIN <=0.5 SENSITIVE Sensitive     TRIMETH/SULFA <=10 SENSITIVE Sensitive     CLINDAMYCIN <=0.25 SENSITIVE Sensitive     RIFAMPIN <=0.5 SENSITIVE Sensitive     Inducible Clindamycin NEGATIVE Sensitive     * ABUNDANT METHICILLIN RESISTANT STAPHYLOCOCCUS AUREUS  Respiratory Panel by RT PCR (Flu A&B, Covid) - Nasopharyngeal Swab     Status: None    Collection Time: 12/22/19  1:15 PM   Specimen: Nasopharyngeal Swab  Result Value Ref Range Status   SARS Coronavirus 2 by RT PCR NEGATIVE NEGATIVE Final    Comment: (NOTE) SARS-CoV-2 target nucleic acids are NOT DETECTED.  The SARS-CoV-2 RNA is generally detectable in upper respiratoy specimens during the acute phase of infection. The lowest concentration of SARS-CoV-2 viral copies this assay can detect is 131 copies/mL. A negative result does not preclude SARS-Cov-2 infection and should not be used as the sole basis for treatment or other patient management decisions. A negative result may occur with  improper specimen collection/handling, submission of specimen other than nasopharyngeal swab, presence of viral mutation(s) within the areas targeted by this assay, and inadequate number of viral copies (<131 copies/mL). A negative result must be combined with clinical observations, patient history, and epidemiological information. The expected result is Negative.  Fact Sheet for Patients:  PinkCheek.be  Fact Sheet for Healthcare Providers:  GravelBags.it  This test is no t yet approved or cleared by the [place] FDA and  has been authorized for detection and/or diagnosis of SARS-CoV-2 by FDA under an Emergency Use Authorization (EUA). This EUA will remain  in effect (meaning this test can be used) for the duration of the COVID-19 declaration under Section 564(b)(1) of the Act, 21 U.S.C. section 360bbb-3(b)(1), unless the authorization is terminated or revoked sooner.     Influenza A by PCR NEGATIVE NEGATIVE Final   Influenza B by PCR  NEGATIVE NEGATIVE Final    Comment: (NOTE) The Xpert Xpress SARS-CoV-2/FLU/RSV assay is intended as an aid in  the diagnosis of influenza from Nasopharyngeal swab specimens and  should not be used as a sole basis for treatment. Nasal washings and  aspirates are unacceptable for Xpert  Xpress SARS-CoV-2/FLU/RSV  testing.  Fact Sheet for Patients: PinkCheek.be  Fact Sheet for Healthcare Providers: GravelBags.it  This test is not yet approved or cleared by the [place] FDA and  has been authorized for detection and/or diagnosis of SARS-CoV-2 by  FDA under an Emergency Use Authorization (EUA). This EUA will remain  in effect (meaning this test can be used) for the duration of the  Covid-19 declaration under Section 564(b)(1) of the Act, 21  U.S.C. section 360bbb-3(b)(1), unless the authorization is  terminated or revoked. Performed at [facility], [address] [place], [place] [address]   CULTURE, BLOOD (ROUTINE X 2) w Reflex to ID Panel     Status: None (Preliminary result)   Collection Time: 12/23/19 12:27 AM   Specimen: BLOOD  Result Value Ref Range Status   Specimen Description BLOOD BLOOD RIGHT HAND  Final   Special Requests   Final    BOTTLES DRAWN AEROBIC AND ANAEROBIC Blood Culture results may not be optimal due to an excessive volume of blood received in culture bottles   Culture   Final    NO GROWTH 2 DAYS Performed at [facility], [address] [place], [place] [address]    Report Status PENDING  Incomplete  CULTURE, BLOOD (ROUTINE X 2) w Reflex to ID Panel     Status: None (Preliminary result)   Collection Time: 12/23/19 12:28 AM   Specimen: BLOOD  Result Value Ref Range Status   Specimen Description BLOOD BLOOD LEFT FOREARM  Final   Special Requests   Final    BOTTLES DRAWN AEROBIC ONLY Blood Culture results may not be optimal due to an inadequate volume of blood received in culture bottles   Culture   Final    NO GROWTH 2 DAYS Performed at [name], [address], [place], [place] [address]    Report Status PENDING  Incomplete  MRSA PCR Screening     Status: Abnormal   Collection Time: 12/24/19  5:09 PM   Specimen: Nasopharyngeal  Result  Value Ref Range Status   MRSA by PCR POSITIVE (A) NEGATIVE Final    Comment:        The GeneXpert MRSA Assay (FDA approved for NASAL specimens only), is one component of a comprehensive MRSA colonization surveillance program. It is not intended to diagnose MRSA infection nor to guide or monitor treatment for MRSA infections. RESULT CALLED TO, READ BACK BY AND VERIFIED WITH: DAN MCCOOL _0  ON 12/24/19 SKL Performed at [facility], [address] [place], [place] [address]       Imaging Studies   No results found.   Medications   Scheduled Meds: . allopurinol  100 mg Oral Daily  . atorvastatin  40 mg Oral Daily  . Chlorhexidine Gluconate Cloth  6 each Topical Q0600  . dextromethorphan-guaiFENesin  1 tablet Oral BID  . diclofenac Sodium  2 g Topical QID  . DULoxetine  20 mg Oral Daily  . fluticasone  2 spray Each Nare BID  . fluticasone furoate-vilanterol  1 puff Inhalation Daily  . ipratropium-albuterol  3 mL Nebulization TID  . levothyroxine  50 mcg Oral QAC breakfast  . lisinopril  10  mg Oral Daily  . metoprolol succinate  50 mg Oral Daily  . montelukast  10 mg Oral QHS  . mupirocin ointment  1 application Nasal BID  . torsemide  40 mg Oral BID  . traZODone  200 mg Oral QHS  . warfarin  7.5 mg Oral ONCE-1600  . Warfarin - Pharmacist Dosing Inpatient   Does not apply q1600   Continuous Infusions: . cefTRIAXone (ROCEPHIN)  IV 2 g (12/25/19 1140)  . vancomycin 1,500 mg (12/25/19 0508)       LOS: 2 days    Time spent: 25 minutes with > 50% spent in coordination of care and direct patient contact    Ezekiel Slocumb, DO [facility]  12/25/2019, 3:40 PM    If 7PM-7AM, please contact night-coverage. How to contact the [facility] Attending or Consulting provider [place] or covering provider during after hours [place], for this patient?    1. Check the care team in [facility] and look for a) attending/consulting [facility] provider listed and b) the [facility] team  listed 2. Log into [email or web address] and use [place]'s universal password to access. If you do not have the password, please contact the hospital operator. 3. Locate the [facility] provider you are looking for under [facility] and page to a number that you can be directly reached. 4. If you still have difficulty reaching the provider, please page the [facility] (Director on Call) for the Hospitalists listed on amion for assistance.

## 2019-12-25 NOTE — Consult Note (Addendum)
WOC Nurse Consult Note: Reason for Consult: Consult requested for right leg cellulitis.  Pt has been followed prior to admission by the outpatient wound care center.  Left leg with generalized edema, no open wounds or weeping.  Pt has a light compression stocking to attempt to reduce edema. Right anterior calf with healing full thickness wound; pt states it was previously leaking a large amt yellow drainage.  Currently it is 6X6X.1cm, red and dry, no odor or drainage.  Generalized edema and erythremia to RLE.  Dressing procedure/placement/frequency: Topical treatment orders provided for bedside nurses to perform as follows: Reapply compression stocking to left leg Q day and make sure it is pulled up the leg Reapply Ace wrap to right leg Q am to maintain compression; apply just behind toes to below knee in a spiral fashion.   Foam dressing to right leg, change Q 3 days or PRN, then cover with ace wrap. Please re-consult if further assistance is needed.  Thank-you,  Cammie Mcgee MSN, RN, CWOCN, [place], CNS [phone]

## 2019-12-25 NOTE — Plan of Care (Addendum)
Pt admitted with acute on chronic respiratory failure with hypoxia due to community acquired PNA, sepsis, RLE cellulitis. Pt Axox4. Calm and cooperative and able to voice his needs. Noted SOB on exertion. Cpap use at night. Pt able to transfer from bed to chair independently but uses DME for longer distance due to bil knee pain. Home PT recommendation in effect. On IV Abx. Pt on  contact isolation for MRSA. Pt using urinal and large amount of clear urine noted. Pt takes pills whole and tolerates diet. Order for cardiac monitoring in effect but no tele box available. Day shift Nurse made aware. Safety measures in place. Will continue to monitor.  Problem: Education: Goal: Knowledge of disease or condition will improve Outcome: Progressing Goal: Knowledge of the prescribed therapeutic regimen will improve Outcome: Progressing Goal: Individualized Educational Video(s) Outcome: Progressing   Problem: Activity: Goal: Ability to tolerate increased activity will improve Outcome: Progressing Goal: Will verbalize the importance of balancing activity with adequate rest periods Outcome: Progressing   Problem: Respiratory: Goal: Ability to maintain a clear airway will improve Outcome: Progressing Goal: Levels of oxygenation will improve Outcome: Progressing Goal: Ability to maintain adequate ventilation will improve Outcome: Progressing   Problem: Education: Goal: Knowledge of General Education information will improve Description: Including pain rating scale, medication(s)/side effects and non-pharmacologic comfort measures Outcome: Progressing   Problem: Health Behavior/Discharge Planning: Goal: Ability to manage health-related needs will improve Outcome: Progressing   Problem: Clinical Measurements: Goal: Ability to maintain clinical measurements within normal limits will improve Outcome: Progressing Goal: Will remain free from infection Outcome: Progressing Goal: Diagnostic test results  will improve Outcome: Progressing Goal: Respiratory complications will improve Outcome: Progressing Goal: Cardiovascular complication will be avoided Outcome: Progressing   Problem: Activity: Goal: Risk for activity intolerance will decrease Outcome: Progressing   Problem: Nutrition: Goal: Adequate nutrition will be maintained Outcome: Progressing   Problem: Coping: Goal: Level of anxiety will decrease Outcome: Progressing   Problem: Elimination: Goal: Will not experience complications related to bowel motility Outcome: Progressing Goal: Will not experience complications related to urinary retention Outcome: Progressing   Problem: Pain Managment: Goal: General experience of comfort will improve Outcome: Progressing   Problem: Safety: Goal: Ability to remain free from injury will improve Outcome: Progressing   Problem: Skin Integrity: Goal: Risk for impaired skin integrity will decrease Outcome: Progressing

## 2019-12-25 NOTE — Progress Notes (Addendum)
KHIYAN, CRACE ([id number]) Visit Report for 12/21/2019 Arrival Information Details Patient Name: OLUWATOMIWA, KINYON Date of Service: 12/21/2019 8:00 AM Medical Record Number: [id number] Patient Account Number: [id number] Date of Birth/Sex: 09/19/1960 (59 y.o. M) Treating RN: Rogers Blocker Primary Care Irma Roulhac: Saralyn Pilar Other Clinician: Referring Tawonna Esquer: Saralyn Pilar Treating Tyqwan Pink/Extender: Rowan Blase in Treatment: 33 Visit Information History Since Last Visit Pain Present Now: No Patient Arrived: Wheel Chair Arrival Time: 08:14 Accompanied By: self Transfer Assistance: None Patient Identification Verified: Yes Secondary Verification Process Completed: Yes Patient Requires Transmission-Based No Precautions: Patient Has Alerts: Yes Patient Alerts: Patient on Blood Thinner Warfarin Electronic Signature(s) Signed: 12/21/2019 2:52:33 PM By: Phillis Haggis, Dondra Prader Entered By: Phillis Haggis, Dondra Prader on 12/21/2019 08:15:00 [name] ([id number]) -------------------------------------------------------------------------------- Clinic Level of Care Assessment Details Patient Name: [name] Date of Service: 12/21/2019 8:00 AM Medical Record Number: [id number] Patient Account Number: [id number] Date of Birth/Sex: 08-28-1960 (59 y.o. M) Treating RN: Huel Coventry Primary Care Raziya Aveni: Saralyn Pilar Other Clinician: Referring Zofia Peckinpaugh: Saralyn Pilar Treating Elmon Shader/Extender: Rowan Blase in Treatment: 57 Clinic Level of Care Assessment Items TOOL 4 Quantity Score []  - Use when only an EandM is performed on FOLLOW-UP visit 0 ASSESSMENTS - Nursing Assessment / Reassessment X - Reassessment of Co-morbidities (includes updates in patient status) 1 10 X- 1 5 Reassessment of Adherence to Treatment Plan ASSESSMENTS - Wound and Skin Assessment / Reassessment []  - Simple Wound Assessment / Reassessment - one wound 0 X- 2  5 Complex Wound Assessment / Reassessment - multiple wounds []  - 0 Dermatologic / Skin Assessment (not related to wound area) ASSESSMENTS - Focused Assessment X - Circumferential Edema Measurements - multi extremities 1 5 []  - 0 Nutritional Assessment / Counseling / Intervention []  - 0 Lower Extremity Assessment (monofilament, tuning fork, pulses) []  - 0 Peripheral Arterial Disease Assessment (using hand held doppler) ASSESSMENTS - Ostomy and/or Continence Assessment and Care []  - Incontinence Assessment and Management 0 []  - 0 Ostomy Care Assessment and Management (repouching, etc.) PROCESS - Coordination of Care X - Simple Patient / Family Education for ongoing care 1 15 []  - 0 Complex (extensive) Patient / Family Education for ongoing care X- 1 10 Staff obtains Consents, Records, Test Results / Process Orders []  - 0 Staff telephones HHA, Nursing Homes / Clarify orders / etc []  - 0 Routine Transfer to another Facility (non-emergent condition) []  - 0 Routine Hospital Admission (non-emergent condition) []  - 0 New Admissions / / Ordering NPWT, Apligraf, etc. []  - 0 Emergency Hospital Admission (emergent condition) X- 1 10 Simple Discharge Coordination []  - 0 Complex (extensive) Discharge Coordination PROCESS - Special Needs []  - Pediatric / Minor Patient Management 0 []  - 0 Isolation Patient Management []  - 0 Hearing / Language / Visual special needs []  - 0 Assessment of Community assistance (transportation, D/C planning, etc.) []  - 0 Additional assistance / Altered mentation []  - 0 Support Surface(s) Assessment (bed, cushion, seat, etc.) INTERVENTIONS - Wound Cleansing / Measurement Tarpley, Jden ( ) []  - 0 Simple Wound Cleansing - one wound X- 2 5 Complex Wound Cleansing - multiple wounds X- 1 5 Wound Imaging (photographs - any number of wounds) []  - 0 Wound Tracing (instead of photographs) []  - 0 Simple Wound Measurement  - one wound X- 2 5 Complex Wound Measurement - multiple wounds INTERVENTIONS - Wound Dressings []  - Small Wound Dressing one or multiple wounds 0 []  - 0 Medium Wound Dressing one or multiple wounds X- 2 20 Large  Wound Dressing one or multiple wounds []  - 0 Application of Medications - topical []  - 0 Application of Medications - injection INTERVENTIONS - Miscellaneous []  - External ear exam 0 X- 1 5 Specimen Collection (cultures, biopsies, blood, body fluids, etc.) X- 1 5 Specimen(s) / Culture(s) sent or taken to Lab for analysis []  - 0 Patient Transfer (multiple staff / Michiel Sites Lift / Similar devices) []  - 0 Simple Staple / Suture removal (25 or less) []  - 0 Complex Staple / Suture removal (26 or more) []  - 0 Hypo / Hyperglycemic Management (close monitor of Blood Glucose) []  - 0 Ankle / Brachial Index (ABI) - do not check if billed separately X- 1 5 Vital Signs Has the patient been seen at the hospital within the last three years: Yes Total Score: 145 Level Of Care: New/Established - Level 4 Electronic Signature(s) Signed: 12/24/2019 6:50:12 PM By: Elliot Gurney, BSN, RN, CWS, Kim RN, BSN Entered By: Elliot Gurney, BSN, RN, CWS, Kim on 12/21/2019 09:09:03 [name] ([id number]) -------------------------------------------------------------------------------- Encounter Discharge Information Details Patient Name: [name] Date of Service: 12/21/2019 8:00 AM Medical Record Number: [id number] Patient Account Number: [id number] Date of Birth/Sex: 07-May-1960 (59 y.o. M) Treating RN: Rogers Blocker Primary Care Arion Shankles: Saralyn Pilar Other Clinician: Referring Ihor Meinzer: Saralyn Pilar Treating Breyona Swander/Extender: Rowan Blase in Treatment: 58 Encounter Discharge Information Items Post Procedure Vitals Discharge Condition: Stable Temperature (F): 98.5 Ambulatory Status: Wheelchair Pulse (bpm): 105 Discharge Destination: Home Respiratory Rate  (breaths/min): 22 Transportation: Private Auto Blood Pressure (mmHg): 144/101 Accompanied By: self Schedule Follow-up Appointment: Yes Clinical Summary of Care: Electronic Signature(s) Signed: 12/21/2019 2:52:33 PM By: Phillis Haggis, Dondra Prader Entered By: Phillis Haggis, Dondra Prader on 12/21/2019 09:17:07 [name] ([id number]) -------------------------------------------------------------------------------- General Visit Notes Details Patient Name: [name] Date of Service: 12/21/2019 8:00 AM Medical Record Number: [id number] Patient Account Number: [id number] Date of Birth/Sex: September 09, 1960 (59 y.o. M) Treating RN: Huel Coventry Primary Care Dreden Rivere: Saralyn Pilar Other Clinician: Referring Haydon Dorris: Saralyn Pilar Treating Rhianne Soman/Extender: Rowan Blase in Treatment: 54 Notes Patient arrived today in a wrap that he had been in since 11/28/2019. Adaleigh Warf [place] talked with patient and explained the dangers of leaving the wrap on and missing appointments (infections, limb loss, worsening wounds, etc). Leonard Schwartz explained to patient the importance of getting to his appointments. Until the patient shows that he will arrive to his scheduled appointments, we will not be wrapping him at this time. Patient is aware of this plan and has been told that if he is compliant with his future appointments with consistency we will reconsider wrapping him in the future. Patient understands and agrees. He states to RN that he will have his sister in law change the dressings at home. I have ordered supplies for the patient from [facility]. Electronic Signature(s) Signed: 12/21/2019 9:34:19 AM By: Elliot Gurney, BSN, RN, CWS, Kim RN, BSN Previous Signature: 12/21/2019 9:28:20 AM Version By: Elliot Gurney, BSN, RN, CWS, Kim RN, BSN Entered By: Elliot Gurney, BSN, RN, CWS, Kim on 12/21/2019 09:34:18 RENSO, MICHEAU  ([id number]) -------------------------------------------------------------------------------- Lower Extremity Assessment Details Patient Name: [name] Date of Service: 12/21/2019 8:00 AM Medical Record Number: [id number] Patient Account Number: [id number] Date of Birth/Sex: 10-12-1960 (59 y.o. M) Treating RN: Rogers Blocker Primary Care Janne Faulk: Saralyn Pilar Other Clinician: Referring Ravneet Spilker: Saralyn Pilar Treating Nahum Sherrer/Extender: Rowan Blase in Treatment: 33 Edema Assessment Assessed: [Left: No] [Right: No] Edema: [Left: Yes] [Right: Yes] Calf Left: Right: Point of Measurement: 31 cm From Medial Instep 51 cm 63 cm Ankle Left: Right:  Point of Measurement: 13 cm From Medial Instep 33 cm 29.5 cm Vascular Assessment Pulses: Dorsalis Pedis Palpable: [Left:Yes] [Right:Yes] Electronic Signature(s) Signed: 12/21/2019 2:52:33 PM By: Phillis Haggis, Kenia Entered By: Phillis Haggis, Dondra Prader on 12/21/2019 08:49:47 [name] ([id number]) -------------------------------------------------------------------------------- Multi Wound Chart Details Patient Name: [name] Date of Service: 12/21/2019 8:00 AM Medical Record Number: [id number] Patient Account Number: [id number] Date of Birth/Sex: May 01, 1960 (59 y.o. M) Treating RN: Huel Coventry Primary Care Nyaisha Simao: Saralyn Pilar Other Clinician: Referring Wenona [place]: Saralyn Pilar Treating Ludene Stokke/Extender: Rowan Blase in Treatment: 48 Vital Signs Height(in): 68 Pulse(bpm): 105 Weight(lbs): 400 Blood Pressure(mmHg): 144/101 Body Mass Index(BMI): 61 Temperature(F): 98.5 Respiratory Rate(breaths/min): 22 Photos: Wound Location: Right, Distal, Medial Lower Leg Right, Proximal, Anterior Lower Leg Left, Lateral Lower Leg Wounding Event: Gradually Appeared Gradually Appeared Gradually Appeared Primary Etiology: Venous Leg Ulcer Lymphedema Lymphedema Secondary Etiology:  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 Comorbid History: Asthma, Chronic Obstructive Asthma, Chronic Obstructive Asthma, Chronic Obstructive Pulmonary Disease (COPD), Sleep Pulmonary Disease (COPD), Sleep Pulmonary Disease (COPD), Sleep Apnea, Arrhythmia, Congestive Apnea, Arrhythmia, Congestive Apnea, Arrhythmia, Congestive Heart Failure, Hypertension, Heart Failure, Hypertension, Heart Failure, Hypertension, Myocardial Infarction, Type II Myocardial Infarction, Type II Myocardial Infarction, Type II Diabetes Diabetes Diabetes Date Acquired: 11/01/2019 12/21/2019 02/12/2019 Weeks of Treatment: 5 0 33 Wound Status: Open Open Healed - Epithelialized Clustered Wound: No No Yes Clustered Quantity: N/A N/A 3 Measurements L x W x D (cm) 9.2x9.4x0.1 0.6x0.5x0.1 0x0x0 Area (cm) : 67.921 0.236 0 Volume (cm) : 6.792 0.024 0 % Reduction in Area: -6552.40% 0.00% 100.00% % Reduction in Volume: -6558.80% 0.00% 100.00% Classification: Full Thickness Without Exposed Full Thickness Without Exposed Full Thickness Without Exposed Support Structures Support Structures Support Structures Exudate Amount: Large Medium Medium Exudate Type: Serosanguineous Serosanguineous Serous Exudate Color: red, brown red, brown amber Wound Margin: N/A N/A Flat and Intact Granulation Amount: None Present (0%) Large (67-100%) Medium (34-66%) Granulation Quality: N/A Red Pink Necrotic Amount: None Present (0%) None Present (0%) Medium (34-66%) Exposed Structures: Fat Layer (Subcutaneous Tissue): Fat Layer (Subcutaneous Tissue): Fat Layer (Subcutaneous Tissue): Yes Yes Yes Fascia: No Fascia: No Fascia: No Tendon: No Tendon: No Tendon: No Muscle: No Muscle: No Muscle: No Joint: No Joint: No Joint: No Bone: No Bone: No Bone: No Epithelialization: None None Medium (34-66%) Treatment Notes TEVITA, GOMER ([id number]) Electronic Signature(s) Signed: 12/24/2019 6:50:12 PM By: Elliot Gurney, BSN, RN, CWS, Kim  RN, BSN Entered By: Elliot Gurney, BSN, RN, CWS, Kim on 12/21/2019 08:59:45 [name] ([id number]) -------------------------------------------------------------------------------- Multi-Disciplinary Care Plan Details Patient Name: [name] Date of Service: 12/21/2019 8:00 AM Medical Record Number: [id number] Patient Account Number: [id number] Date of Birth/Sex: 12-14-1960 (59 y.o. M) Treating RN: Huel Coventry Primary Care Abbagail Scaff: Saralyn Pilar Other Clinician: Referring Nolawi Kanady: Saralyn Pilar Treating Jimmie Dattilio/Extender: Rowan Blase in Treatment: 53 Active Inactive Electronic Signature(s) Signed: 01/07/2020 11:59:34 AM By: Elliot Gurney, BSN, RN, CWS, Kim RN, BSN Previous Signature: 12/24/2019 6:50:12 PM Version By: Elliot Gurney, BSN, RN, CWS, Kim RN, BSN Entered By: Elliot Gurney, BSN, RN, CWS, Kim on 01/07/2020 11:59:33 RAS, KOLLMAN ([id number]) -------------------------------------------------------------------------------- Pain Assessment Details Patient Name: [name] Date of Service: 12/21/2019 8:00 AM Medical Record Number: [id number] Patient Account Number: [id number] Date of Birth/Sex: 04-24-1960 (59 y.o. M) Treating RN: Rogers Blocker Primary Care Jossilyn Benda: Saralyn Pilar Other Clinician: Referring Roslyn Else: Saralyn Pilar Treating Allyse Fregeau/Extender: Rowan Blase in Treatment: 34 Active Problems Location of Pain Severity and Description of Pain Patient Has Paino No Site Locations Rate the pain. Current Pain Level: 0  Pain Management and Medication Current Pain Management: Electronic Signature(s) Signed: 12/21/2019 2:52:33 PM By: Phillis Haggis, Dondra Prader Entered By: Phillis Haggis, Dondra Prader on 12/21/2019 08:17:53 [name] ([id number]) -------------------------------------------------------------------------------- Patient/Caregiver Education Details Patient Name: [name] Date of Service: 12/21/2019 8:00 AM Medical Record  Number: [id number] Patient Account Number: [id number] Date of Birth/Gender: Jan 11, 1961 (59 y.o. M) Treating RN: Huel Coventry Primary Care Physician: Saralyn Pilar Other Clinician: Referring Physician: Saralyn Pilar Treating Physician/Extender: Rowan Blase in Treatment: 45 Education Assessment Education Provided To: Patient Education Topics Provided Wound/Skin Impairment: Handouts: Caring for Your Ulcer, Other: wound care as prescribed Methods: Demonstration, Explain/Verbal Responses: State content correctly Electronic Signature(s) Signed: 12/24/2019 6:50:12 PM By: Elliot Gurney, BSN, RN, CWS, Kim RN, BSN Entered By: Elliot Gurney, BSN, RN, CWS, Kim on 12/21/2019 09:11:05 [name] ([id number]) -------------------------------------------------------------------------------- Wound Assessment Details Patient Name: [name] Date of Service: 12/21/2019 8:00 AM Medical Record Number: [id number] Patient Account Number: [id number] Date of Birth/Sex: Oct 30, 1960 (59 y.o. M) Treating RN: Rogers Blocker Primary Care Carlyn Mullenbach: Saralyn Pilar Other Clinician: Referring [name]: Saralyn Pilar Treating Nicklous Aburto/Extender: Rowan Blase in Treatment: 33 Wound Status Wound Number: 10 Primary Venous Leg Ulcer Etiology: Wound Location: Right, Distal, Medial Lower Leg Wound Open Wounding Event: Gradually Appeared Status: Date Acquired: 11/01/2019 Comorbid Asthma, Chronic Obstructive Pulmonary Disease (COPD), Weeks Of Treatment: 5 History: Sleep Apnea, Arrhythmia, Congestive Heart Failure, Clustered Wound: No Hypertension, Myocardial Infarction, Type II Diabetes Photos Wound Measurements Length: (cm) 9.2 Width: (cm) 9.4 Depth: (cm) 0.1 Area: (cm) 67.921 Volume: (cm) 6.792 % Reduction in Area: -6552.4% % Reduction in Volume: -6558.8% Epithelialization: None Tunneling: No Undermining: No Wound Description Classification: Full Thickness Without  Exposed Support Structu Exudate Amount: Large Exudate Type: Serosanguineous Exudate Color: red, brown res Foul Odor After Cleansing: No Slough/Fibrino No Wound Bed Granulation Amount: None Present (0%) Exposed Structure Necrotic Amount: None Present (0%) Fascia Exposed: No Fat Layer (Subcutaneous Tissue) Exposed: Yes Tendon Exposed: No Muscle Exposed: No Joint Exposed: No Bone Exposed: No Electronic Signature(s) Signed: 12/21/2019 2:52:33 PM By: Phillis Haggis, Dondra Prader Entered By: Phillis Haggis, Dondra Prader on 12/21/2019 08:46:05 [name] ([id number]) -------------------------------------------------------------------------------- Wound Assessment Details Patient Name: [name] Date of Service: 12/21/2019 8:00 AM Medical Record Number: [id number] Patient Account Number: [id number] Date of Birth/Sex: 08-14-1960 (59 y.o. M) Treating RN: Rogers Blocker Primary Care Angelisse Riso: Saralyn Pilar Other Clinician: Referring Rasheda Ledger: Saralyn Pilar Treating Sequoia Witz/Extender: Rowan Blase in Treatment: 33 Wound Status Wound Number: 11 Primary Lymphedema Etiology: Wound Location: Right, Proximal, Anterior Lower Leg Secondary Diabetic Wound/Ulcer of the Lower Extremity Wounding Event: Gradually Appeared Etiology: Date Acquired: 12/21/2019 Wound Open Weeks Of Treatment: 0 Status: Clustered Wound: No Comorbid Asthma, Chronic Obstructive Pulmonary Disease (COPD), History: Sleep Apnea, Arrhythmia, Congestive Heart Failure, Hypertension, Myocardial Infarction, Type II Diabetes Photos Wound Measurements Length: (cm) 0.6 Width: (cm) 0.5 Depth: (cm) 0.1 Area: (cm) 0.236 Volume: (cm) 0.024 % Reduction in Area: 0% % Reduction in Volume: 0% Epithelialization: None Tunneling: No Undermining: No Wound Description Classification: Full Thickness Without Exposed Support Structu Exudate Amount: Medium Exudate Type: Serosanguineous Exudate Color: red,  brown res Foul Odor After Cleansing: No Slough/Fibrino No Wound Bed Granulation Amount: Large (67-100%) Exposed Structure Granulation Quality: Red Fascia Exposed: No Necrotic Amount: None Present (0%) Fat Layer (Subcutaneous Tissue) Exposed: Yes Tendon Exposed: No Muscle Exposed: No Joint Exposed: No Bone Exposed: No Electronic Signature(s) Signed: 12/21/2019 2:52:33 PM By: Phillis Haggis, Dondra Prader Entered By: Phillis Haggis, Dondra Prader on 12/21/2019 08:45:48 [name] ([id number]) -------------------------------------------------------------------------------- Wound Assessment Details Patient Name: [name]  Date of Service: 12/21/2019 8:00 AM Medical Record Number: [id number] Patient Account Number: [id number] Date of Birth/Sex: [birth year] (59 y.o. M) Treating RN: Rogers Blocker Primary Care Lamika Connolly: Saralyn Pilar Other Clinician: Referring Avantika Shere: Saralyn Pilar Treating Jeromie Gainor/Extender: Rowan Blase in Treatment: 33 Wound Status Wound Number: 5 Primary Lymphedema Etiology: Wound Location: Left, Lateral Lower Leg Wound Healed - Epithelialized Wounding Event: Gradually Appeared Status: Date Acquired: 02/12/2019 Comorbid Asthma, Chronic Obstructive Pulmonary Disease (COPD), Weeks Of Treatment: 33 History: Sleep Apnea, Arrhythmia, Congestive Heart Failure, Clustered Wound: Yes Hypertension, Myocardial Infarction, Type II Diabetes Photos Wound Measurements Length: (cm) Width: (cm) Depth: (cm) Clustered Quantity: Area: (cm) Volume: (cm) 0 % Reduction in Area: 100% 0 % Reduction in Volume: 100% 0 Epithelialization: Medium (34-66%) 3 Tunneling: No 0 Undermining: No 0 Wound Description Classification: Full Thickness Without Exposed Support Structu Wound Margin: Flat and Intact Exudate Amount: Medium Exudate Type: Serous Exudate Color: amber res Foul Odor After Cleansing: No Slough/Fibrino Yes Wound Bed Granulation Amount:  Medium (34-66%) Exposed Structure Granulation Quality: Pink Fascia Exposed: No Necrotic Amount: Medium (34-66%) Fat Layer (Subcutaneous Tissue) Exposed: Yes Necrotic Quality: Adherent Slough Tendon Exposed: No Muscle Exposed: No Joint Exposed: No Bone Exposed: No Electronic Signature(s) Signed: 12/21/2019 2:52:33 PM By: Phillis Haggis, Dondra Prader Entered By: Phillis Haggis, Dondra Prader on 12/21/2019 08:55:19 [name] ([id number]) -------------------------------------------------------------------------------- Vitals Details Patient Name: [name] Date of Service: 12/21/2019 8:00 AM Medical Record Number: [id number] Patient Account Number: [id number] Date of Birth/Sex: 1960-05-12 (59 y.o. M) Treating RN: Rogers Blocker Primary Care Marshun Duva: Saralyn Pilar Other Clinician: Referring Eulalah Rupert: Saralyn Pilar Treating Amelianna Meller/Extender: Rowan Blase in Treatment: 33 Vital Signs Time Taken: 08:15 Temperature (F): 98.5 Height (in): 68 Pulse (bpm): 105 Weight (lbs): 400 Respiratory Rate (breaths/min): 22 Body Mass Index (BMI): 60.8 Blood Pressure (mmHg): 144/101 Reference Range: 80 - 120 mg / dl Electronic Signature(s) Signed: 12/21/2019 2:52:33 PM By: Phillis Haggis, Dondra Prader Entered By: Phillis Haggis, Dondra Prader on 12/21/2019 08:19:10

## 2019-12-26 ENCOUNTER — Encounter: Payer: Self-pay | Admitting: Internal Medicine

## 2019-12-26 DIAGNOSIS — J189 Pneumonia, unspecified organism: Secondary | ICD-10-CM | POA: Diagnosis not present

## 2019-12-26 DIAGNOSIS — A419 Sepsis, unspecified organism: Secondary | ICD-10-CM | POA: Diagnosis not present

## 2019-12-26 DIAGNOSIS — J9621 Acute and chronic respiratory failure with hypoxia: Secondary | ICD-10-CM | POA: Diagnosis not present

## 2019-12-26 DIAGNOSIS — L03115 Cellulitis of right lower limb: Secondary | ICD-10-CM

## 2019-12-26 DIAGNOSIS — I482 Chronic atrial fibrillation, unspecified: Secondary | ICD-10-CM | POA: Diagnosis not present

## 2019-12-26 LAB — PROTIME-INR
INR: 2.1 — ABNORMAL HIGH (ref 0.8–1.2)
Prothrombin Time: 23 seconds — ABNORMAL HIGH (ref 11.4–15.2)

## 2019-12-26 LAB — CREATININE, SERUM
Creatinine, Ser: 0.93 mg/dL (ref 0.61–1.24)
GFR, Estimated: >60 mL/min (ref >60)

## 2019-12-26 MED ORDER — CHLORHEXIDINE GLUCONATE 0.12 % MT SOLN
15.0000 mL | Freq: Two times a day (BID) | OROMUCOSAL | Status: DC
  Administered 2019-12-26: 15 mL via OROMUCOSAL
  Filled 2019-12-26 (×5): qty 15

## 2019-12-26 MED ORDER — ORAL CARE MOUTH RINSE
15.0000 mL | Freq: Two times a day (BID) | OROMUCOSAL | Status: DC
  Administered 2019-12-26: 15 mL via OROMUCOSAL

## 2019-12-26 MED ORDER — WARFARIN SODIUM 5 MG PO TABS
5.0000 mg | ORAL_TABLET | Freq: Once | ORAL | Status: AC
  Administered 2019-12-26: 5 mg via ORAL
  Filled 2019-12-26: qty 1

## 2019-12-26 NOTE — Progress Notes (Signed)
Progress Note    [name] [name]  HYQ:[id number] DOB: [birth year]  DOA: 12/22/2019 PCP: [name], DO      Brief Narrative:    Medical records reviewed and are as summarized below:  [name] [name] is a 59 y.o. 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 He presented to the hospital with increasing shortness of breath, fever, chills and general malaise.  Reportedly, his temperature was 101 F at home.  He was admitted to the hospital for acute on chronic hypoxemic respiratory failure, sepsis secondary to pneumonia and right leg cellulitis. He was also found to have right leg cellulitis.  He was initially treated with BiPAP and transitioned to oxygen via nasal cannula.  He uses 3 L/min oxygen via nasal cannula and CPAP at home he was treated with empiric IV antibiotics and analgesics.   Assessment/Plan:   Principal Problem:   Sepsis (HCC) Active Problems:   Acute on chronic respiratory failure with hypoxia (HCC)   OSA (obstructive sleep apnea)   HTN (hypertension)   Morbid obesity with BMI of 60.0-69.9, adult (HCC)   Chest pain   HLD (hyperlipidemia)   CAD (coronary artery disease)   Cellulitis of right lower extremity   Hypothyroidism   Degenerative joint disease of right hip   Chronic anticoagulation (warfarin  COUMADIN)   Shortness of breath   Atrial fibrillation, chronic (HCC)   Class 3 obesity with alveolar hypoventilation, serious comorbidity, and body mass index (BMI) of 60.0 to 69.9 in adult [facility])   Community acquired pneumonia   Body mass index is 62.34 kg/m.  (Morbid obesity): This complicates overall care and prognosis.   Sepsis secondary to  community-acquired pneumonia and right leg cellulitis: Continue empiric IV antibiotics.  Analgesics as needed for pain.  Acute on chronic hypoxemic respiratory failure: He is on 4 L/min oxygen.  He normally wears 3 L/min oxygen at home.  COPD: Continue bronchodilators.  Continue CPAP at night for OSA.  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  Headache, history of migraine: Analgesics as needed for pain  Chronic diastolic CHF/hypertension: Compensated.  Continue torsemide and antihypertensives  Chronic atrial fibrillation: Continue warfarin and monitor INR.  Depression and anxiety: Continue Cymbalta  Hypothyroidism: He is on Synthroid.  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  Unfortunately, patient has Medicaid and this makes it difficult for him to receive home health services.     Diet Order            Diet 2 gram sodium Room service appropriate? Yes; Fluid consistency: Thin  Diet effective now                    Consultants:  None  Procedures:  None    Medications:   . allopurinol  100 mg Oral Daily  . atorvastatin  40 mg Oral Daily  . chlorhexidine  15 mL Mouth Rinse BID  . Chlorhexidine Gluconate Cloth  6 each Topical Q0600  . dextromethorphan-guaiFENesin  1 tablet Oral BID  . diclofenac Sodium  2 g Topical QID  .  DULoxetine  20 mg Oral Daily  . fluticasone  2 spray Each Nare BID  . fluticasone furoate-vilanterol  1 puff Inhalation Daily  . ipratropium-albuterol  3 mL Nebulization TID  . levothyroxine  50 mcg Oral QAC breakfast  . lisinopril  10 mg Oral Daily  . mouth rinse  15 mL Mouth Rinse q12n4p  . metoprolol succinate  50 mg Oral Daily  . montelukast  10 mg Oral QHS  . mupirocin ointment  1  application Nasal BID  . torsemide  40 mg Oral BID  . traZODone  200 mg Oral QHS  . warfarin  5 mg Oral ONCE-1600  . Warfarin - Pharmacist Dosing Inpatient   Does not apply q1600   Continuous Infusions: . cefTRIAXone (ROCEPHIN)  IV 2 g (12/26/19 1322)  . vancomycin 1,500 mg (12/26/19 0520)     Anti-infectives (From admission, onward)   Start     Dose/Rate Route Frequency Ordered Stop   12/25/19 0600  vancomycin (VANCOREADY) IVPB 1500 mg/300 mL        1,500 mg 150 mL/hr over 120 Minutes Intravenous Every 12 hours 12/24/19 1326     12/24/19 1600  vancomycin (VANCOREADY) IVPB 2000 mg/400 mL        2,000 mg 200 mL/hr over 120 Minutes Intravenous  Once 12/24/19 1326 12/24/19 2239   12/24/19 1200  cefTRIAXone (ROCEPHIN) 2 g in sodium chloride 0.9 % 100 mL IVPB       Note to Pharmacy: Adjust start time accordingly based on last cefepime given   2 g 200 mL/hr over 30 Minutes Intravenous Every 24 hours 12/24/19 0752     12/23/19 1000  cefTRIAXone (ROCEPHIN) 1 g in sodium chloride 0.9 % 100 mL IVPB  Status:  Discontinued        1 g 200 mL/hr over 30 Minutes Intravenous Every 24 hours 12/23/19 0001 12/23/19 0002   12/22/19 2200  azithromycin (ZITHROMAX) 500 mg in sodium chloride 0.9 % 250 mL IVPB        500 mg 250 mL/hr over 60 Minutes Intravenous Every 24 hours 12/22/19 2115 12/25/19 0124   12/22/19 1645  ceFEPIme (MAXIPIME) 2 g in sodium chloride 0.9 % 100 mL IVPB  Status:  Discontinued        2 g 200 mL/hr over 30 Minutes Intravenous Every 8 hours 12/22/19 1631 12/24/19 0752             Family Communication/Anticipated D/C date and plan/Code Status   DVT prophylaxis: Place TED hose Start: 12/22/19 1628 warfarin (COUMADIN) tablet 5 mg     Code Status: Partial Code  Family Communication: None Disposition Plan:    Status is: Inpatient  Remains inpatient appropriate because:IV treatments appropriate due to intensity of illness or inability to take PO   Dispo: The  patient is from: Home              Anticipated d/c is to: Home              Anticipated d/c date is: 2 days              Patient currently is not medically stable to d/c.           Subjective:   C/o pain in the right leg and cough.  He also feels short of breath and dyspnea with exertion/activity.  He expressed his dissatisfaction that home health services had been discontinued by his insurance company.    Objective:    Vitals:  12/26/19 0403 12/26/19 1115 12/26/19 1116 12/26/19 1328  BP: 93/62 103/88    Pulse: 79 61 78   Resp: 20     Temp: (!) 97.5 F (36.4 C) 98.4 F (36.9 C)    TempSrc: Oral Oral    SpO2: 94% (!) 89% 97% 99%  Weight:       No data found.   Intake/Output Summary (Last 24 hours) at 12/26/2019 1424 Last data filed at 12/26/2019 0658 Gross per 24 hour  Intake 641.02 ml  Output 2700 ml  Net -2058.98 ml   Filed Weights   12/22/19 1301  Weight: (!) 186 kg    Exam:  GEN: NAD SKIN: Warm and dry.  Superficial right leg wound. EYES: No pallor or icterus ENT: MMM CV: RRR PULM: CTA B ABD: soft, obese, NT, +BS CNS: AAO x 3, non focal EXT: B/l leg edema.  Erythema and tenderness on right leg.     Data Reviewed:   I have personally reviewed following labs and imaging studies:  Labs: Labs show the following:   Basic Metabolic Panel: Recent Labs  Lab 12/22/19 1259 12/22/19 1259 12/23/19 0537 12/23/19 0537 12/24/19 0423 12/25/19 0519 12/26/19 0504  NA 137  --  135  --  137 134*  --   K 3.7   < > 4.2   < > 3.9 3.9  --   CL 96*  --  98  --  96* 88*  --   CO2 30  --  29  --  34* 33*  --   GLUCOSE 160*  --  171*  --  128* 155*  --   BUN 11  --  15  --  22* 29*  --   CREATININE 0.82  --  0.87  --  0.96 1.02 0.93  CALCIUM 7.9*  --  8.5*  --  8.7* 8.7*  --   MG  --   --   --   --  1.8  --   --    < > = values in this interval not displayed.   GFR Estimated Creatinine Clearance: 139.6 mL/min (by C-G formula based on SCr of 0.93  mg/dL). Liver Function Tests: Recent Labs  Lab 12/22/19 1259  AST 35  ALT 30  ALKPHOS 75  BILITOT 1.5*  PROT 6.5  ALBUMIN 3.1*   No results for input(s): LIPASE, AMYLASE in the last 168 hours. No results for input(s): AMMONIA in the last 168 hours. Coagulation profile Recent Labs  Lab 12/22/19 1259 12/23/19 0537 12/24/19 0423 12/25/19 0519 12/26/19 0504  INR 1.3* 1.2 1.8* 2.0* 2.1*    CBC: Recent Labs  Lab 12/22/19 1259 12/23/19 0537  WBC 9.3 7.2  HGB 13.1 13.2  HCT 41.2 40.5  MCV 93.4 91.4  PLT 219 204   Cardiac Enzymes: No results for input(s): CKTOTAL, CKMB, CKMBINDEX, TROPONINI in the last 168 hours. BNP (last 3 results) No results for input(s): PROBNP in the last 8760 hours. CBG: No results for input(s): GLUCAP in the last 168 hours. D-Dimer: No results for input(s): DDIMER in the last 72 hours. Hgb A1c: No results for input(s): HGBA1C in the last 72 hours. Lipid Profile: No results for input(s): CHOL, HDL, LDLCALC, TRIG, CHOLHDL, LDLDIRECT in the last 72 hours. Thyroid function studies: No results for input(s): TSH, T4TOTAL, T3FREE, THYROIDAB in the last 72 hours.  Invalid input(s): FREET3 Anemia work up: No results for input(s): VITAMINB12, FOLATE, FERRITIN, TIBC, IRON, RETICCTPCT in the last 72 hours. Sepsis  Labs: Recent Labs  Lab 12/22/19 1259 12/23/19 0537  WBC 9.3 7.2    Microbiology Recent Results (from the past 240 hour(s))  Aerobic Culture (superficial specimen)     Status: None   Collection Time: 12/21/19  8:58 AM   Specimen: Wound  Result Value Ref Range Status   Specimen Description   Final    WOUND Performed at [facility], [address], [place], [place] [address]    Special Requests RIGHT LEG  Final   Gram Stain   Final    NO WBC SEEN ABUNDANT GRAM POSITIVE COCCI MODERATE GRAM POSITIVE RODS RARE GRAM NEGATIVE RODS Performed at [facility], [address] [place], [place] [address]    Culture    Final    ABUNDANT METHICILLIN RESISTANT STAPHYLOCOCCUS AUREUS RARE FLAVOBACTERIUM ODORATUM    Report Status 12/25/2019 FINAL  Final   Organism ID, Bacteria METHICILLIN RESISTANT STAPHYLOCOCCUS AUREUS  Final   Organism ID, Bacteria FLAVOBACTERIUM ODORATUM  Final      Susceptibility   Flavobacterium odoratum - MIC*    CEFAZOLIN >=64 RESISTANT Resistant     CEFTAZIDIME >=64 RESISTANT Resistant     CIPROFLOXACIN >=4 RESISTANT Resistant     GENTAMICIN >=16 RESISTANT Resistant     IMIPENEM 2 SENSITIVE Sensitive     TRIMETH/SULFA >=320 RESISTANT Resistant     PIP/TAZO >=128 RESISTANT Resistant     * RARE FLAVOBACTERIUM ODORATUM   Methicillin resistant staphylococcus aureus - MIC*    CIPROFLOXACIN >=8 RESISTANT Resistant     ERYTHROMYCIN >=8 RESISTANT Resistant     GENTAMICIN <=0.5 SENSITIVE Sensitive     OXACILLIN >=4 RESISTANT Resistant     TETRACYCLINE <=1 SENSITIVE Sensitive     VANCOMYCIN <=0.5 SENSITIVE Sensitive     TRIMETH/SULFA <=10 SENSITIVE Sensitive     CLINDAMYCIN <=0.25 SENSITIVE Sensitive     RIFAMPIN <=0.5 SENSITIVE Sensitive     Inducible Clindamycin NEGATIVE Sensitive     * ABUNDANT METHICILLIN RESISTANT STAPHYLOCOCCUS AUREUS  Respiratory Panel by RT PCR (Flu A&B, Covid) - Nasopharyngeal Swab     Status: None   Collection Time: 12/22/19  1:15 PM   Specimen: Nasopharyngeal Swab  Result Value Ref Range Status   SARS Coronavirus 2 by RT PCR NEGATIVE NEGATIVE Final    Comment: (NOTE) SARS-CoV-2 target nucleic acids are NOT DETECTED.  The SARS-CoV-2 RNA is generally detectable in upper respiratoy specimens during the acute phase of infection. The lowest concentration of SARS-CoV-2 viral copies this assay can detect is 131 copies/mL. A negative result does not preclude SARS-Cov-2 infection and should not be used as the sole basis for treatment or other patient management decisions. A negative result may occur with  improper specimen collection/handling, submission of  specimen other than nasopharyngeal swab, presence of viral mutation(s) within the areas targeted by this assay, and inadequate number of viral copies (<131 copies/mL). A negative result must be combined with clinical observations, patient history, and epidemiological information. The expected result is Negative.  Fact Sheet for Patients:  https://www.moore.com/  Fact Sheet for Healthcare Providers:  https://www.young.biz/  This test is no t yet approved or cleared by the [place] FDA and  has been authorized for detection and/or diagnosis of SARS-CoV-2 by FDA under an Emergency Use Authorization (EUA). This EUA will remain  in effect (meaning this test can be used) for the duration of the COVID-19 declaration under Section 564(b)(1) of the Act, 21 U.S.C. section 360bbb-3(b)(1), unless the authorization is terminated or revoked  sooner.     Influenza A by PCR NEGATIVE NEGATIVE Final   Influenza B by PCR NEGATIVE NEGATIVE Final    Comment: (NOTE) The Xpert Xpress SARS-CoV-2/FLU/RSV assay is intended as an aid in  the diagnosis of influenza from Nasopharyngeal swab specimens and  should not be used as a sole basis for treatment. Nasal washings and  aspirates are unacceptable for Xpert Xpress SARS-CoV-2/FLU/RSV  testing.  Fact Sheet for Patients: https://www.moore.com/  Fact Sheet for Healthcare Providers: https://www.young.biz/  This test is not yet approved or cleared by the [place] FDA and  has been authorized for detection and/or diagnosis of SARS-CoV-2 by  FDA under an Emergency Use Authorization (EUA). This EUA will remain  in effect (meaning this test can be used) for the duration of the  Covid-19 declaration under Section 564(b)(1) of the Act, 21  U.S.C. section 360bbb-3(b)(1), unless the authorization is  terminated or revoked. Performed at [facility], [address] [place], [place] [address]   CULTURE, BLOOD (ROUTINE X 2) w Reflex to ID Panel     Status: None (Preliminary result)   Collection Time: 12/23/19 12:27 AM   Specimen: BLOOD  Result Value Ref Range Status   Specimen Description BLOOD BLOOD RIGHT HAND  Final   Special Requests   Final    BOTTLES DRAWN AEROBIC AND ANAEROBIC Blood Culture results may not be optimal due to an excessive volume of blood received in culture bottles   Culture   Final    NO GROWTH 3 DAYS Performed at [facility], [address] [place], [place] [address]    Report Status PENDING  Incomplete  CULTURE, BLOOD (ROUTINE X 2) w Reflex to ID Panel     Status: None (Preliminary result)   Collection Time: 12/23/19 12:28 AM   Specimen: BLOOD  Result Value Ref Range Status   Specimen Description BLOOD BLOOD LEFT FOREARM  Final   Special Requests   Final    BOTTLES DRAWN AEROBIC ONLY Blood Culture results may not be optimal due to an inadequate volume of blood received in culture bottles   Culture   Final    NO GROWTH 3 DAYS Performed at [facility], [address], [place], [place] [address]    Report Status PENDING  Incomplete  MRSA PCR Screening     Status: Abnormal   Collection Time: 12/24/19  5:09 PM   Specimen: Nasopharyngeal  Result Value Ref Range Status   MRSA by PCR POSITIVE (A) NEGATIVE Final    Comment:        The GeneXpert MRSA Assay (FDA approved for NASAL specimens only), is one component of a comprehensive MRSA colonization surveillance program. It is not intended to diagnose MRSA infection nor to guide or monitor treatment for MRSA infections. RESULT CALLED TO, READ BACK BY AND VERIFIED WITH: DAN MCCOOL @1914  ON 12/24/19 SKL Performed at [facility], [address] [place], [place]     Procedures and diagnostic studies:  No results found.             LOS: 3 days   Lira Stephen  [facility]   Pager on [phone]. If  7PM-7AM, please contact night-coverage at [email or web address]     12/26/2019, 2:24 PM

## 2019-12-26 NOTE — Progress Notes (Signed)
ANTICOAGULATION CONSULT NOTE  Pharmacy Consult for Warfarin Dosing Indication: atrial fibrillation  Patient Measurements: Weight: (!) 186 kg (410 lb)   Vital Signs: Temp: 97.5 F (36.4 C) (11/10 0403) Temp Source: Oral (11/10 0403) BP: 93/62 (11/10 0403) Pulse Rate: 79 (11/10 0403)  Labs: Recent Labs    12/24/19 0423 12/25/19 0519 12/26/19 0504  LABPROT 20.3* 21.9* 23.0*  INR 1.8* 2.0* 2.1*  CREATININE 0.96 1.02 0.93    Estimated Creatinine Clearance: 139.6 mL/min (by C-G formula based on SCr of 0.93 mg/dL).   Medical History: Past Medical History:  Diagnosis Date  . Allergy   . Anxiety   . Arthritis   . Asthma   . Brain damage   . Chronic pain of both knees 07/13/2018  . Clotting disorder (HCC)   . COPD (chronic obstructive pulmonary disease) (HCC)   . Depression   . GERD (gastroesophageal reflux disease)   . HFrEF (heart failure with reduced ejection fraction) (HCC)    a. 03/2018 Echo: EF 25-30%, diff HK. Mod LAE.  [name] History of DVT (deep vein thrombosis)   . History of pulmonary embolism    a. Chronic coumadin.  [name] Hypertension   . MI (myocardial infarction) (HCC)   . Morbid obesity (HCC)   . Neck pain 07/13/2018  . NICM (nonischemic cardiomyopathy) (HCC)    a. s/p Cath x 3 - reportedly nl cors. Last cath 2019 in [place]; b. a. 03/2018 Echo: EF 25-30%, diff HK.  [name] Persistent atrial fibrillation (HCC)    a. 03/2018 s/p DCCV; b. CHA2DS2VASc = 1-->Xarelto (later changed to warfarin); c. 05/2018 recurrent afib-->Amio initiated.  . Sleep apnea   . Sleep apnea     Home Dose: Warfarin 5mg  daily  Assessment: Patient is a 59yo male admitted for shortness of breath. Patient has a history of afib and takes warfarin for stroke prevention. Pharmacy consulted for Warfarin dosing. INR on admission was subtherapeutic. I spoke with the patient and he admits to missing at least one dose PTA, which would explain the low INR  Date   INR   Dose 11/06   1.3   7.5  mg  11/07   1.2   7.5 mg 11/08   1.8   7.5 mg 11/09   2.0   7.5 mg 11/10   2.1   5 mg  DDIs: ceftriaxone, vancomycin  Goal of Therapy:  INR 2-3 Monitor platelets by anticoagulation protocol: Yes   Plan:   INR therapeutic   Home dose of warfarin 5 mg tonight  Daily INR checks  CBC at least every 3 days per protocol  13/10, PharmD, BCPS Clinical Pharmacist 12/26/2019 8:54 AM

## 2019-12-26 NOTE — Progress Notes (Signed)
Physical Therapy Treatment Patient Details Name: [name] [name] MRN: [id number] DOB: Sep 03, 1960 Today's Date: 12/26/2019    History of Present Illness Pt is a 59 y.o. male presenting to hospital 11/6 with respiratory distress/SOB.  Pt admitted with acute on chronic respiratory failure with hypoxia (d/t community acquired PNA), sepsis, and R LE cellulitis.  PMH includes anxiety, COPD, CHF with EF 25-30%, morbid obesity, sleep apnea, a-fib, h/o TBI, chronic B knee pain, clotting disorder, h/o DVT, h/o PE, htn, MI, chronic L ear hearing loss, L sided Bell's palsy, cardioversion, h/o leg surgery.    PT Comments    Pt was sitting in recliner eating Mc donald mc ribs upon arriving. Chartered loss adjuster educated pt on proper diet however pt states he is going to eat what he wants.He states, " Fat boys got to eat."  He agrees to PT session but likes to dictate session. Was able to stand to bariatric RW and ambulate 25 ft in room prior to needing seated rest. sao2 >92% on 3 L during ambulation. Unwilling to ambulate further distances and reports pain throughout. Pt does not ambulate long distances prior to admission. He is around baseline abilities. Recommend HHPT to address deficits with endurance, strength, and safety awareness.He was sitting in recliner at conclusion of session with call bell in reach.      Follow Up Recommendations  Home health PT     Equipment Recommendations  None recommended by PT    Recommendations for Other Services       Precautions / Restrictions Precautions Precautions: None Restrictions Weight Bearing Restrictions: No    Mobility  Bed Mobility  General bed mobility comments: in recliner pre/post session  Transfers Overall transfer level: Needs assistance Equipment used: Rolling walker (2 wheeled) (bariatric) Transfers: Sit to/from Stand Sit to Stand: Supervision         General transfer comment: Pt was able to stand from recliner without physical assistance. Pt is  impulsive.  Ambulation/Gait Ambulation/Gait assistance: Supervision Gait Distance (Feet): 25 Feet Assistive device: Rolling walker (2 wheeled) (Bariatric)   Gait velocity: rapid   General Gait Details: Pt was able to ambulate to doorway of room and back to recliner with supervision. Therapist managed o2 tank throughout. Pt has very fast cadence and was unwilling to slow down.          Balance Overall balance assessment: Modified Independent       Cognition Arousal/Alertness: Awake/alert Behavior During Therapy: WFL for tasks assessed/performed Overall Cognitive Status: Within Functional Limits for tasks assessed      General Comments: Pt is A and O x 4 and agreeable top PT session. sitting up in recliner eating mc donalds mc rib sandwiches. discussed cardiac diet but pt states," fat boys got to eat."             Pertinent Vitals/Pain Pain Assessment: 0-10 Pain Score:  (did not rate) Pain Location: R hip pain Pain Descriptors / Indicators: Aching;Sore;Constant;Discomfort Pain Intervention(s): Limited activity within patient's tolerance;Monitored during session;Repositioned           PT Goals (current goals can now be found in the care plan section) Acute Rehab PT Goals Patient Stated Goal: to go home Progress towards PT goals: Progressing toward goals    Frequency    Min 2X/week      PT Plan Current plan remains appropriate       AM-PAC PT "6 Clicks" Mobility   Outcome Measure  Help needed turning from your back to your side while in  a flat bed without using bedrails?: None Help needed moving from lying on your back to sitting on the side of a flat bed without using bedrails?: None Help needed moving to and from a bed to a chair (including a wheelchair)?: A Little Help needed standing up from a chair using your arms (e.g., wheelchair or bedside chair)?: A Little Help needed to walk in hospital room?: A Little Help needed climbing 3-5 steps with a  railing? : A Lot 6 Click Score: 19    End of Session Equipment Utilized During Treatment: Oxygen (3L o2 maintained > 92% throughout) Activity Tolerance: Patient limited by fatigue Patient left: in chair;with call bell/phone within reach;with chair alarm set Nurse Communication: Mobility status;Precautions PT Visit Diagnosis: Other abnormalities of gait and mobility (R26.89);Muscle weakness (generalized) (M62.81);Difficulty in walking, not elsewhere classified (R26.2)     Time: 9371-6967 PT Time Calculation (min) (ACUTE ONLY): 12 min  Charges:  $Therapeutic Activity: 8-22 mins                     Jetta Lout PTA 12/26/19, 4:32 PM

## 2019-12-27 DIAGNOSIS — J189 Pneumonia, unspecified organism: Secondary | ICD-10-CM | POA: Diagnosis not present

## 2019-12-27 DIAGNOSIS — L03115 Cellulitis of right lower limb: Secondary | ICD-10-CM | POA: Diagnosis not present

## 2019-12-27 DIAGNOSIS — I482 Chronic atrial fibrillation, unspecified: Secondary | ICD-10-CM | POA: Diagnosis not present

## 2019-12-27 DIAGNOSIS — J9621 Acute and chronic respiratory failure with hypoxia: Secondary | ICD-10-CM | POA: Diagnosis not present

## 2019-12-27 DIAGNOSIS — A419 Sepsis, unspecified organism: Secondary | ICD-10-CM | POA: Diagnosis not present

## 2019-12-27 LAB — PROTIME-INR
INR: 1.9 — ABNORMAL HIGH (ref 0.8–1.2)
Prothrombin Time: 21.3 seconds — ABNORMAL HIGH (ref 11.4–15.2)

## 2019-12-27 LAB — VANCOMYCIN, RANDOM: Vancomycin Rm: 24

## 2019-12-27 MED ORDER — SUMATRIPTAN SUCCINATE 50 MG PO TABS
50.0000 mg | ORAL_TABLET | ORAL | Status: DC | PRN
  Filled 2019-12-27: qty 1

## 2019-12-27 MED ORDER — VANCOMYCIN HCL 2000 MG/400ML IV SOLN
2000.0000 mg | INTRAVENOUS | Status: DC
  Filled 2019-12-27: qty 400

## 2019-12-27 MED ORDER — WARFARIN SODIUM 7.5 MG PO TABS
7.5000 mg | ORAL_TABLET | Freq: Once | ORAL | Status: AC
  Administered 2019-12-27: 7.5 mg via ORAL
  Filled 2019-12-27: qty 1

## 2019-12-27 NOTE — Consult Note (Signed)
Pharmacy Antibiotic Note  Khyree Carillo is a 59 y.o. male admitted on 12/22/2019 with pneumonia accompanying chronic (CXR shows mild patchy airspace opacity at the posterior left lung base and possible early infectious etiology. Additionally he was admitted with a right lateral LL wound that was cultured and subsequently grew out MRSA. Pharmacy was consulted for vancomycin dosing. This is day # 4 of IV vancomycin. A vancomycin level was drawn this morning prior to the 5th scheduled dose  vancomycin trough 12/27/19 0444: 24 mcg/mL  Plan: adjust vancomycin dose to 2000 mg IV every 24 hours  Daily SCr while on vancomycin to assess renal function  Ke: 0.036 h-1, T1/2: 19.3 h  Css (calculated): 25.6 / 11.6 mcg/mL  Levels as clinically indicated  Weight: (!) 186 kg (410 lb)  Temp (24hrs), Avg:98.2 F (36.8 C), Min:97.8 F (36.6 C), Max:98.6 F (37 C)  Recent Labs  Lab 12/22/19 1259 12/23/19 0537 12/24/19 0423 12/25/19 0519 12/26/19 0504  WBC 9.3 7.2  --   --   --   CREATININE 0.82 0.87 0.96 1.02 0.93    Estimated Creatinine Clearance: 139.6 mL/min (by C-G formula based on SCr of 0.93 mg/dL).    No Known Allergies  Antimicrobials this admission: cefepime 11/6 >> 11/8 azithromycin 11/6 >> 11/9 ceftriaxone 11/8 >>  vancomycin 11/8 >>  Microbiology results: 11/5 WCx: Ab MRSA, mod GPR, rare F odoratum 11/7 BCx: NG 4 days 11/7 SARS CoV-2: negative  11/7 influenza A/B: negative 11/7 MRSA PCR: positive  Thank you for allowing pharmacy to be a part of this patient's care.  Lowella Bandy 12/27/2019 7:06 AM

## 2019-12-27 NOTE — Plan of Care (Signed)
Headache resolved with medication.  Patient sitting in chair to eat. Tolerated well without distress. Will continue to monitor.

## 2019-12-27 NOTE — Progress Notes (Addendum)
Progress Note    [name] [name]  TDH:[id number] DOB: Sep 18, 1960  DOA: 12/22/2019 PCP: [name], DO      Brief Narrative:    Medical records reviewed and are as summarized below:  [name] [name] is a 59 y.o. 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 He presented to the hospital with increasing shortness of breath, fever, chills and general malaise.  Reportedly, his temperature was 101 F at home.  He was admitted to the hospital for acute on chronic hypoxemic respiratory failure, sepsis secondary to pneumonia and right leg cellulitis. He was also found to have right leg cellulitis.  He was initially treated with BiPAP and transitioned to oxygen via nasal cannula.  He uses 3 L/min oxygen via nasal cannula and CPAP at home he was treated with empiric IV antibiotics and analgesics.   Assessment/Plan:   Principal Problem:   Sepsis (HCC) Active Problems:   Acute on chronic respiratory failure with hypoxia (HCC)   OSA (obstructive sleep apnea)   HTN (hypertension)   Morbid obesity with BMI of 60.0-69.9, adult (HCC)   Chest pain   HLD (hyperlipidemia)   CAD (coronary artery disease)   Cellulitis of right lower extremity   Hypothyroidism   Degenerative joint disease of right hip   Chronic anticoagulation (warfarin  COUMADIN)   Shortness of breath   Atrial fibrillation, chronic (HCC)   Class 3 obesity with alveolar hypoventilation, serious comorbidity, and body mass index (BMI) of 60.0 to 69.9 in adult [facility])   Community acquired pneumonia   Body mass index is 62.34 kg/m.  (Morbid obesity): This complicates overall care and prognosis.   Sepsis secondary to  community-acquired pneumonia and right leg cellulitis: Continue IV vancomycin and ceftriaxone for 1 more day  Acute on chronic hypoxemic respiratory failure: He is tolerating 3 L/min oxygen via nasal cannula which is his baseline.    COPD: Continue bronchodilators.  Continue CPAP at night for OSA.  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  Headache, history of migraine: He is willing to try Imitrex for migraine.  Chronic diastolic CHF/hypertension: Compensated.  Continue torsemide and antihypertensives.  His diet has been changed from 2 g sodium diet to heart healthy diet and patient may have banana pudding as needed.  Chronic atrial fibrillation: Continue warfarin and monitor INR.  Depression and anxiety: Continue Cymbalta  Hypothyroidism: He is on Synthroid.  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  He understands that his Medicaid insurance makes it difficult for him to have home health services.   Diet Order            Diet Heart Room service appropriate? Yes; Fluid consistency: Thin  Diet effective now                    Consultants:  None  Procedures:  None    Medications:   . allopurinol  100 mg Oral Daily  . atorvastatin  40 mg Oral Daily  . chlorhexidine  15 mL Mouth Rinse BID  . Chlorhexidine Gluconate Cloth  6 each Topical Q0600  . dextromethorphan-guaiFENesin  1 tablet Oral BID  . diclofenac Sodium  2 g Topical QID  . DULoxetine  20 mg Oral Daily  . fluticasone  2 spray Each Nare BID  . fluticasone furoate-vilanterol  1 puff Inhalation Daily  . ipratropium-albuterol  3 mL Nebulization TID  . levothyroxine  50 mcg Oral QAC breakfast  . lisinopril  10 mg Oral Daily  . mouth rinse  15 mL Mouth Rinse q12n4p  . metoprolol succinate  50  mg Oral Daily  . montelukast  10 mg Oral QHS  . mupirocin ointment  1 application Nasal BID  . torsemide  40 mg Oral BID  . traZODone  200 mg Oral QHS  . Warfarin - Pharmacist Dosing Inpatient   Does not apply q1600   Continuous Infusions: . cefTRIAXone (ROCEPHIN)  IV 2 g (12/27/19 1147)  . vancomycin 1,500 mg (12/27/19 0525)     Anti-infectives (From admission, onward)   Start     Dose/Rate Route Frequency Ordered Stop   12/25/19 0600  vancomycin (VANCOREADY) IVPB 1500 mg/300 mL        1,500 mg 150 mL/hr over 120 Minutes Intravenous Every 12 hours 12/24/19 1326     12/24/19 1600  vancomycin (VANCOREADY) IVPB 2000 mg/400 mL        2,000 mg 200 mL/hr over 120 Minutes Intravenous  Once 12/24/19 1326 12/24/19 2239   12/24/19 1200  cefTRIAXone (ROCEPHIN) 2 g in sodium chloride 0.9 % 100 mL IVPB       Note to Pharmacy: Adjust start time accordingly based on last cefepime given   2 g 200 mL/hr over 30 Minutes Intravenous Every 24 hours 12/24/19 0752     12/23/19 1000  cefTRIAXone (ROCEPHIN) 1 g in sodium chloride 0.9 % 100 mL IVPB  Status:  Discontinued        1 g 200 mL/hr over 30 Minutes Intravenous Every 24 hours 12/23/19 0001 12/23/19 0002   12/22/19 2200  azithromycin (ZITHROMAX) 500 mg in sodium chloride 0.9 % 250 mL IVPB        500 mg 250 mL/hr over 60 Minutes Intravenous Every 24 hours 12/22/19 2115 12/25/19 0124   12/22/19 1645  ceFEPIme (MAXIPIME) 2 g in sodium chloride 0.9 % 100 mL IVPB  Status:  Discontinued        2 g 200 mL/hr over 30 Minutes Intravenous Every 8 hours 12/22/19 1631 12/24/19 0752             Family Communication/Anticipated D/C date and plan/Code Status   DVT prophylaxis: Place TED hose Start: 12/22/19 1628     Code Status: Partial Code  Family Communication: None Disposition Plan:    Status is: Inpatient  Remains inpatient appropriate because:IV treatments appropriate due to intensity of illness or inability to take PO   Dispo: The  patient is from: Home              Anticipated d/c is to: Home              Anticipated d/c date is: 2 days              Patient currently is not medically stable to d/c.           Subjective:   C/o pain and itching in the right leg.  His breathing is better.  He still complains of a headache which he attributes to his migraine.  He was dissatisfied about his diet.  He said he wanted to have a banana  pudding with occasional staff refused to give it to him because of restriction in his diet.  He said he has not had any banana pudding in 10 years.    Objective:    Vitals:   12/27/19 0503 12/27/19 0756 12/27/19 0900 12/27/19 1133  BP: 111/69  126/65 122/79  Pulse: 79  85 (!) 56  Resp:   20 (!) 24  Temp:   98.1 F (36.7 C) 97.6 F (36.4 C)  TempSrc:   Oral Oral  SpO2:  93% 94% 94%  Weight:       No data found.   Intake/Output Summary (Last 24 hours) at 12/27/2019 1216 Last data filed at 12/27/2019 1137 Gross per 24 hour  Intake 700 ml  Output 5700 ml  Net -5000 ml   Filed Weights   12/22/19 1301  Weight: (!) 186 kg    Exam:  GEN: NAD SKIN: Superficial right leg wound. EYES: EOMI ENT: MMM CV: RRR PULM: CTA B ABD: soft, obese, NT, +BS CNS: AAO x 3, non focal EXT: Bilateral leg edema.  Erythema and tenderness on the right leg.    Data Reviewed:   I have personally reviewed following labs and imaging studies:  Labs: Labs show the following:   Basic Metabolic Panel: Recent Labs  Lab 12/22/19 1259 12/22/19 1259 12/23/19 0537 12/23/19 0537 12/24/19 0423 12/25/19 0519 12/26/19 0504  NA 137  --  135  --  137 134*  --   K 3.7   < > 4.2   < > 3.9 3.9  --   CL 96*  --  98  --  96* 88*  --   CO2 30  --  29  --  34* 33*  --   GLUCOSE 160*  --  171*  --  128* 155*  --   BUN 11  --  15  --  22* 29*  --   CREATININE 0.82  --  0.87  --  0.96 1.02 0.93  CALCIUM 7.9*  --  8.5*  --  8.7* 8.7*  --   MG  --   --   --   --  1.8  --   --    < > = values in  this interval not displayed.   GFR Estimated Creatinine Clearance: 139.6 mL/min (by C-G formula based on SCr of 0.93 mg/dL). Liver Function Tests: Recent Labs  Lab 12/22/19 1259  AST 35  ALT 30  ALKPHOS 75  BILITOT 1.5*  PROT 6.5  ALBUMIN 3.1*   No results for input(s): LIPASE, AMYLASE in the last 168 hours. No results for input(s): AMMONIA in the last 168 hours. Coagulation profile Recent Labs  Lab 12/23/19 0537 12/24/19 0423 12/25/19 0519 12/26/19 0504 12/27/19 0444  INR 1.2 1.8* 2.0* 2.1* 1.9*    CBC: Recent Labs  Lab 12/22/19 1259 12/23/19 0537  WBC 9.3 7.2  HGB 13.1 13.2  HCT 41.2 40.5  MCV 93.4 91.4  PLT 219 204   Cardiac Enzymes: No results for input(s): CKTOTAL, CKMB, CKMBINDEX, TROPONINI in the last 168 hours. BNP (last 3 results) No results for input(s): PROBNP in the last 8760 hours. CBG: No results for input(s): GLUCAP in the last 168 hours. D-Dimer: No results for input(s): DDIMER in the last 72 hours. Hgb A1c: No results for input(s): HGBA1C in the last 72 hours. Lipid Profile: No results for input(s): CHOL, HDL, LDLCALC, TRIG, CHOLHDL, LDLDIRECT in the last 72 hours. Thyroid function studies: No results for input(s):  TSH, T4TOTAL, T3FREE, THYROIDAB in the last 72 hours.  Invalid input(s): FREET3 Anemia work up: No results for input(s): VITAMINB12, FOLATE, FERRITIN, TIBC, IRON, RETICCTPCT in the last 72 hours. Sepsis Labs: Recent Labs  Lab 12/22/19 1259 12/23/19 0537  WBC 9.3 7.2    Microbiology Recent Results (from the past 240 hour(s))  Aerobic Culture (superficial specimen)     Status: None   Collection Time: 12/21/19  8:58 AM   Specimen: Wound  Result Value Ref Range Status   Specimen Description   Final    WOUND Performed at [facility], [address], [place], [place] [address]    Special Requests RIGHT LEG  Final   Gram Stain   Final    NO WBC SEEN ABUNDANT GRAM POSITIVE COCCI MODERATE GRAM POSITIVE  RODS RARE GRAM NEGATIVE RODS Performed at [facility], [address] [place], [place] [address]    Culture   Final    ABUNDANT METHICILLIN RESISTANT STAPHYLOCOCCUS AUREUS RARE FLAVOBACTERIUM ODORATUM    Report Status 12/25/2019 FINAL  Final   Organism ID, Bacteria METHICILLIN RESISTANT STAPHYLOCOCCUS AUREUS  Final   Organism ID, Bacteria FLAVOBACTERIUM ODORATUM  Final      Susceptibility   Flavobacterium odoratum - MIC*    CEFAZOLIN >=64 RESISTANT Resistant     CEFTAZIDIME >=64 RESISTANT Resistant     CIPROFLOXACIN >=4 RESISTANT Resistant     GENTAMICIN >=16 RESISTANT Resistant     IMIPENEM 2 SENSITIVE Sensitive     TRIMETH/SULFA >=320 RESISTANT Resistant     PIP/TAZO >=128 RESISTANT Resistant     * RARE FLAVOBACTERIUM ODORATUM   Methicillin resistant staphylococcus aureus - MIC*    CIPROFLOXACIN >=8 RESISTANT Resistant     ERYTHROMYCIN >=8 RESISTANT Resistant     GENTAMICIN <=0.5 SENSITIVE Sensitive     OXACILLIN >=4 RESISTANT Resistant     TETRACYCLINE <=1 SENSITIVE Sensitive     VANCOMYCIN <=0.5 SENSITIVE Sensitive     TRIMETH/SULFA <=10 SENSITIVE Sensitive     CLINDAMYCIN <=0.25 SENSITIVE Sensitive     RIFAMPIN <=0.5 SENSITIVE Sensitive     Inducible Clindamycin NEGATIVE Sensitive     * ABUNDANT METHICILLIN RESISTANT STAPHYLOCOCCUS AUREUS  Respiratory Panel by RT PCR (Flu A&B, Covid) - Nasopharyngeal Swab     Status: None   Collection Time: 12/22/19  1:15 PM   Specimen: Nasopharyngeal Swab  Result Value Ref Range Status   SARS Coronavirus 2 by RT PCR NEGATIVE NEGATIVE Final    Comment: (NOTE) SARS-CoV-2 target nucleic acids are NOT DETECTED.  The SARS-CoV-2 RNA is generally detectable in upper respiratoy specimens during the acute phase of infection. The lowest concentration of SARS-CoV-2 viral copies this assay can detect is 131 copies/mL. A negative result does not preclude SARS-Cov-2 infection and should not be used as the sole basis for treatment  or other patient management decisions. A negative result may occur with  improper specimen collection/handling, submission of specimen other than nasopharyngeal swab, presence of viral mutation(s) within the areas targeted by this assay, and inadequate number of viral copies (<131 copies/mL). A negative result must be combined with clinical observations, patient history, and epidemiological information. The expected result is Negative.  Fact Sheet for Patients:  https://www.moore.com/  Fact Sheet for Healthcare Providers:  https://www.young.biz/  This test is no t yet approved or cleared by the [place] FDA and  has been authorized for detection and/or diagnosis of SARS-CoV-2 by FDA under an Emergency Use Authorization (EUA). This EUA will remain  in effect (meaning this test can be used) for the duration of the COVID-19 declaration under Section 564(b)(1) of the Act, 21 U.S.C. section 360bbb-3(b)(1), unless the authorization is terminated or revoked sooner.     Influenza A by PCR NEGATIVE NEGATIVE Final   Influenza B by PCR NEGATIVE NEGATIVE Final    Comment: (NOTE) The Xpert Xpress SARS-CoV-2/FLU/RSV assay is intended as an aid in  the diagnosis of influenza from Nasopharyngeal swab specimens and  should not be used as a sole basis for treatment. Nasal washings and  aspirates are unacceptable for Xpert Xpress SARS-CoV-2/FLU/RSV  testing.  Fact Sheet for Patients: https://www.moore.com/  Fact Sheet for Healthcare Providers: https://www.young.biz/  This test is not yet approved or cleared by the [place] FDA and  has been authorized for detection and/or diagnosis of SARS-CoV-2 by  FDA under an Emergency Use Authorization (EUA). This EUA will remain  in effect (meaning this test can be used) for the duration of the  Covid-19 declaration under Section 564(b)(1) of the Act, 21  U.S.C.  section 360bbb-3(b)(1), unless the authorization is  terminated or revoked. Performed at [facility], [address] [place], [place] [address]   CULTURE, BLOOD (ROUTINE X 2) w Reflex to ID Panel     Status: None (Preliminary result)   Collection Time: 12/23/19 12:27 AM   Specimen: BLOOD  Result Value Ref Range Status   Specimen Description BLOOD BLOOD RIGHT HAND  Final   Special Requests   Final    BOTTLES DRAWN AEROBIC AND ANAEROBIC Blood Culture results may not be optimal due to an excessive volume of blood received in culture bottles   Culture   Final    NO GROWTH 4 DAYS Performed at [facility], [address], [place], [place] [address]    Report Status PENDING  Incomplete  CULTURE, BLOOD (ROUTINE X 2) w Reflex to ID Panel     Status: None (Preliminary result)   Collection Time: 12/23/19 12:28 AM   Specimen: BLOOD  Result Value Ref Range Status   Specimen Description BLOOD BLOOD LEFT FOREARM  Final   Special Requests   Final    BOTTLES DRAWN AEROBIC ONLY Blood Culture results may not be optimal due to an inadequate volume of blood received in culture bottles   Culture   Final    NO GROWTH 4 DAYS Performed at [facility], [address], [place], [place] [address]    Report Status PENDING  Incomplete  MRSA PCR Screening     Status: Abnormal   Collection Time: 12/24/19  5:09 PM   Specimen: Nasopharyngeal  Result Value Ref Range Status   MRSA by PCR POSITIVE (A) NEGATIVE Final    Comment:        The GeneXpert MRSA Assay (FDA approved for NASAL specimens only), is one component of a comprehensive MRSA colonization surveillance program. It is not intended to diagnose MRSA infection nor to guide or monitor treatment for MRSA infections. RESULT CALLED TO, READ BACK BY AND VERIFIED WITH: DAN MCCOOL @1914  ON 12/24/19 SKL Performed at [facility], [address] [place], [place]     Procedures and diagnostic  studies:  No results found.             LOS: 4 days   Ramar Nobrega  [facility]   Pager on [phone]. If 7PM-7AM, please contact night-coverage at [email or web address]     12/27/2019, 12:16 PM

## 2019-12-27 NOTE — TOC Progression Note (Signed)
Transition of Care [facility]) - Progression Note    Patient Details  Name: [name] [name] MRN: [id number] Date of Birth: 17-Nov-1960  [facility] CM/SW Contact  Margarito Liner, LCSW Phone Number: 12/27/2019, 9:53 AM  Clinical Narrative:  CSW went by room and notified patient that we have been unable to set up home health for him due to insurance and staffing. Patient is not interested in outpatient PT at this time because "he is in too bad of shape."   Expected Discharge Plan: Home/Self Care Barriers to Discharge: Continued Medical Work up  Expected Discharge Plan and Services Expected Discharge Plan: Home/Self Care     Post Acute Care Choice: NA Living arrangements for the past 2 months: Apartment                                       Social Determinants of Health (SDOH) Interventions    Readmission Risk Interventions Readmission Risk Prevention Plan 12/24/2019 01/25/2019 10/25/2018  Transportation Screening Complete Complete Complete  PCP or Specialist Appt within 3-5 Days - - Complete  HRI or Home Care Consult - - Complete  Social Work Consult for Recovery Care Planning/Counseling - - Complete  Palliative Care Screening - - Not Applicable  Medication Review Oceanographer) Complete Complete Complete  PCP or Specialist appointment within 3-5 days of discharge Complete Complete -  HRI or Home Care Consult - Complete -  SW Recovery Care/Counseling Consult Complete Complete -  Palliative Care Screening Not Applicable Not Applicable -  Skilled Nursing Facility Not Applicable Not Applicable -

## 2019-12-27 NOTE — Progress Notes (Signed)
ANTICOAGULATION CONSULT NOTE  Pharmacy Consult for Warfarin Dosing Indication: atrial fibrillation  Patient Measurements: Weight: (!) 186 kg (410 lb)   Vital Signs: Temp: 98.1 F (36.7 C) (11/11 0458) Temp Source: Oral (11/11 0458) BP: 111/69 (11/11 0503) Pulse Rate: 79 (11/11 0503)  Labs: Recent Labs    12/25/19 0519 12/26/19 0504 12/27/19 0444  LABPROT 21.9* 23.0* 21.3*  INR 2.0* 2.1* 1.9*  CREATININE 1.02 0.93  --     Estimated Creatinine Clearance: 139.6 mL/min (by C-G formula based on SCr of 0.93 mg/dL).   Medical History: Past Medical History:  Diagnosis Date  . Allergy   . Anxiety   . Arthritis   . Asthma   . Brain damage   . Chronic pain of both knees 07/13/2018  . Clotting disorder (HCC)   . COPD (chronic obstructive pulmonary disease) (HCC)   . Depression   . GERD (gastroesophageal reflux disease)   . HFrEF (heart failure with reduced ejection fraction) (HCC)    a. 03/2018 Echo: EF 25-30%, diff HK. Mod LAE.  [name] History of DVT (deep vein thrombosis)   . History of pulmonary embolism    a. Chronic coumadin.  [name] Hypertension   . MI (myocardial infarction) (HCC)   . Morbid obesity (HCC)   . Neck pain 07/13/2018  . NICM (nonischemic cardiomyopathy) (HCC)    a. s/p Cath x 3 - reportedly nl cors. Last cath 2019 in [place]; b. a. 03/2018 Echo: EF 25-30%, diff HK.  [name] Persistent atrial fibrillation (HCC)    a. 03/2018 s/p DCCV; b. CHA2DS2VASc = 1-->Xarelto (later changed to warfarin); c. 05/2018 recurrent afib-->Amio initiated.  . Sleep apnea   . Sleep apnea     Home Dose: Warfarin 5mg  daily  Assessment: Patient is a 59yo male admitted for shortness of breath. Patient has a history of afib and takes warfarin for stroke prevention. Pharmacy consulted for Warfarin dosing. INR on admission was subtherapeutic. I spoke with the patient and he admits to missing at least one dose PTA, which would explain the low initial INR  Date   INR   Dose 11/06   1.3   7.5  mg  11/07   1.2   7.5 mg 11/08   1.8   7.5 mg 11/09   2.0   7.5 mg 11/10   2.1   5 mg 11/11   1.9   7.5 mg  DDIs: ceftriaxone, vancomycin, allopurinol (on at home)  Goal of Therapy:  INR 2-3 Monitor platelets by anticoagulation protocol: Yes   Plan:   INR sub-therapeutic   warfarin 7.5 mg tonight (50% > home dose)  Daily INR checks  CBC at least every 3 days per protocol  13/11, PharmD, BCPS Clinical Pharmacist 12/27/2019 7:06 AM

## 2019-12-28 ENCOUNTER — Ambulatory Visit: Payer: Medicaid Other | Admitting: Physician Assistant

## 2019-12-28 DIAGNOSIS — J9621 Acute and chronic respiratory failure with hypoxia: Secondary | ICD-10-CM | POA: Diagnosis not present

## 2019-12-28 DIAGNOSIS — I482 Chronic atrial fibrillation, unspecified: Secondary | ICD-10-CM | POA: Diagnosis not present

## 2019-12-28 DIAGNOSIS — A419 Sepsis, unspecified organism: Secondary | ICD-10-CM | POA: Diagnosis not present

## 2019-12-28 DIAGNOSIS — L03115 Cellulitis of right lower limb: Secondary | ICD-10-CM | POA: Diagnosis not present

## 2019-12-28 DIAGNOSIS — J189 Pneumonia, unspecified organism: Secondary | ICD-10-CM | POA: Diagnosis not present

## 2019-12-28 LAB — BASIC METABOLIC PANEL
Anion gap: 8 (ref 5–15)
Anion gap: 12 (ref 5–15)
BUN: 32 mg/dL — ABNORMAL HIGH (ref 6–20)
BUN: 27 mg/dL — ABNORMAL HIGH (ref 6–20)
CO2: 36 mmol/L — ABNORMAL HIGH (ref 22–32)
CO2: 31 mmol/L (ref 22–32)
Calcium: 9.5 mg/dL (ref 8.9–10.3)
Calcium: 9.4 mg/dL (ref 8.9–10.3)
Chloride: 92 mmol/L — ABNORMAL LOW (ref 98–111)
Chloride: 90 mmol/L — ABNORMAL LOW (ref 98–111)
Creatinine, Ser: 1.25 mg/dL — ABNORMAL HIGH (ref 0.61–1.24)
Creatinine, Ser: 1.05 mg/dL (ref 0.61–1.24)
GFR, Estimated: >60 mL/min (ref >60)
GFR, Estimated: >60 mL/min (ref >60)
Glucose, Bld: 133 mg/dL — ABNORMAL HIGH (ref 70–99)
Glucose, Bld: 93 mg/dL (ref 70–99)
Potassium: 4.1 mmol/L (ref 3.5–5.1)
Potassium: 4.3 mmol/L (ref 3.5–5.1)
Sodium: 136 mmol/L (ref 135–145)
Sodium: 133 mmol/L — ABNORMAL LOW (ref 135–145)

## 2019-12-28 LAB — CBC WITH DIFFERENTIAL/PLATELET
Abs Immature Granulocytes: 0.05 10*3/uL (ref 0.00–0.07)
Basophils Absolute: 0.1 10*3/uL (ref 0.0–0.1)
Basophils Relative: 1 %
Eosinophils Absolute: 0.2 10*3/uL (ref 0.0–0.5)
Eosinophils Relative: 4 %
HCT: 36.6 % — ABNORMAL LOW (ref 39.0–52.0)
Hemoglobin: 11.5 g/dL — ABNORMAL LOW (ref 13.0–17.0)
Immature Granulocytes: 1 %
Lymphocytes Relative: 21 %
Lymphs Abs: 1.4 10*3/uL (ref 0.7–4.0)
MCH: 29.6 pg (ref 26.0–34.0)
MCHC: 31.4 g/dL (ref 30.0–36.0)
MCV: 94.1 fL (ref 80.0–100.0)
Monocytes Absolute: 1 10*3/uL (ref 0.1–1.0)
Monocytes Relative: 15 %
Neutro Abs: 4.1 10*3/uL (ref 1.7–7.7)
Neutrophils Relative %: 58 %
Platelets: 236 10*3/uL (ref 150–400)
RBC: 3.89 MIL/uL — ABNORMAL LOW (ref 4.22–5.81)
RDW: 16.1 % — ABNORMAL HIGH (ref 11.5–15.5)
WBC: 6.8 10*3/uL (ref 4.0–10.5)
nRBC: 0 % (ref 0.0–0.2)

## 2019-12-28 LAB — CULTURE, BLOOD (ROUTINE X 2)
Culture: NO GROWTH
Culture: NO GROWTH

## 2019-12-28 LAB — PROTIME-INR
INR: 1.7 — ABNORMAL HIGH (ref 0.8–1.2)
Prothrombin Time: 19.1 seconds — ABNORMAL HIGH (ref 11.4–15.2)

## 2019-12-28 MED ORDER — SUMATRIPTAN SUCCINATE 50 MG PO TABS: 50.0000 mg | ORAL_TABLET | ORAL | 0 refills | Status: DC | PRN

## 2019-12-28 MED ORDER — LISINOPRIL 10 MG PO TABS: 10.0000 mg | ORAL_TABLET | Freq: Every day | ORAL | Status: DC

## 2019-12-28 MED ORDER — LACTATED RINGERS IV SOLN: INTRAVENOUS | Status: AC

## 2019-12-28 MED ORDER — FLUTICASONE FUROATE-VILANTEROL 100-25 MCG/INH IN AEPB
1.0000 | INHALATION_SPRAY | Freq: Every day | RESPIRATORY_TRACT | 0 refills | Status: DC
Start: 2019-12-29 — End: 2020-01-12

## 2019-12-28 MED ORDER — WARFARIN SODIUM 10 MG PO TABS
10.0000 mg | ORAL_TABLET | Freq: Once | ORAL | Status: DC
  Filled 2019-12-28: qty 1

## 2019-12-28 MED ORDER — HYDROCODONE-ACETAMINOPHEN 7.5-325 MG PO TABS: 1.0000 | ORAL_TABLET | Freq: Three times a day (TID) | ORAL | 0 refills | Status: DC | PRN

## 2019-12-28 MED ORDER — TORSEMIDE 20 MG PO TABS
40.0000 mg | ORAL_TABLET | Freq: Two times a day (BID) | ORAL | Status: DC
  Filled 2019-12-28: qty 2

## 2019-12-28 MED ORDER — TORSEMIDE 20 MG PO TABS: 40.0000 mg | ORAL_TABLET | Freq: Two times a day (BID) | ORAL | Status: DC

## 2019-12-28 NOTE — Plan of Care (Addendum)
Pt Axox4. Calm and cooperative and able to voice his needs. Pt on c-pap at night. Pt on 3L [place] when awake. Rt leg cellulitis with wound noted. Dressing change q 3 days and due today. Pt able to pivot from bed to chair, and to chair to bed. Pt able to ambulate with walker if needed. Pt able to use urinal to void and large amount of clear yellow urine noted. Afib on the monitor, rate controlled. Safety measures in place. Will continue to monitor.  Problem: Education: Goal: Knowledge of disease or condition will improve Outcome: Progressing Goal: Knowledge of the prescribed therapeutic regimen will improve Outcome: Progressing Goal: Individualized Educational Video(s) Outcome: Progressing   Problem: Activity: Goal: Ability to tolerate increased activity will improve Outcome: Progressing Goal: Will verbalize the importance of balancing activity with adequate rest periods Outcome: Progressing   Problem: Respiratory: Goal: Ability to maintain a clear airway will improve Outcome: Progressing Goal: Levels of oxygenation will improve Outcome: Progressing Goal: Ability to maintain adequate ventilation will improve Outcome: Progressing   Problem: Education: Goal: Knowledge of General Education information will improve Description: Including pain rating scale, medication(s)/side effects and non-pharmacologic comfort measures Outcome: Progressing   Problem: Health Behavior/Discharge Planning: Goal: Ability to manage health-related needs will improve Outcome: Progressing   Problem: Clinical Measurements: Goal: Ability to maintain clinical measurements within normal limits will improve Outcome: Progressing Goal: Will remain free from infection Outcome: Progressing Goal: Diagnostic test results will improve Outcome: Progressing Goal: Respiratory complications will improve Outcome: Progressing Goal: Cardiovascular complication will be avoided Outcome: Progressing   Problem:  Activity: Goal: Risk for activity intolerance will decrease Outcome: Progressing   Problem: Nutrition: Goal: Adequate nutrition will be maintained Outcome: Progressing   Problem: Coping: Goal: Level of anxiety will decrease Outcome: Progressing   Problem: Elimination: Goal: Will not experience complications related to bowel motility Outcome: Progressing Goal: Will not experience complications related to urinary retention Outcome: Progressing   Problem: Pain Managment: Goal: General experience of comfort will improve Outcome: Progressing   Problem: Safety: Goal: Ability to remain free from injury will improve Outcome: Progressing   Problem: Skin Integrity: Goal: Risk for impaired skin integrity will decrease Outcome: Progressing

## 2019-12-28 NOTE — Plan of Care (Signed)
Problem: Education: Goal: Knowledge of disease or condition will improve 12/28/2019 1155 by Ansel Bong, RN Outcome: Progressing 12/28/2019 1155 by Ansel Bong, RN Outcome: Progressing Goal: Knowledge of the prescribed therapeutic regimen will improve 12/28/2019 1155 by Ansel Bong, RN Outcome: Progressing 12/28/2019 1155 by Ansel Bong, RN Outcome: Progressing Goal: Individualized Educational Video(s) 12/28/2019 1155 by Ansel Bong, RN Outcome: Progressing 12/28/2019 1155 by Ansel Bong, RN Outcome: Progressing   Problem: Activity: Goal: Ability to tolerate increased activity will improve 12/28/2019 1155 by Ansel Bong, RN Outcome: Progressing 12/28/2019 1155 by Ansel Bong, RN Outcome: Progressing Goal: Will verbalize the importance of balancing activity with adequate rest periods 12/28/2019 1155 by Ansel Bong, RN Outcome: Progressing 12/28/2019 1155 by Ansel Bong, RN Outcome: Progressing   Problem: Respiratory: Goal: Ability to maintain a clear airway will improve 12/28/2019 1155 by Ansel Bong, RN Outcome: Progressing 12/28/2019 1155 by Ansel Bong, RN Outcome: Progressing Goal: Levels of oxygenation will improve 12/28/2019 1155 by Ansel Bong, RN Outcome: Progressing 12/28/2019 1155 by Ansel Bong, RN Outcome: Progressing Goal: Ability to maintain adequate ventilation will improve 12/28/2019 1155 by Ansel Bong, RN Outcome: Progressing 12/28/2019 1155 by Ansel Bong, RN Outcome: Progressing   Problem: Education: Goal: Knowledge of General Education information will improve Description: Including pain rating scale, medication(s)/side effects and non-pharmacologic comfort measures 12/28/2019 1155 by Ansel Bong, RN Outcome: Progressing 12/28/2019 1155 by Ansel Bong, RN Outcome: Progressing   Problem: Health Behavior/Discharge Planning: Goal: Ability to manage health-related needs will  improve 12/28/2019 1155 by Ansel Bong, RN Outcome: Progressing 12/28/2019 1155 by Ansel Bong, RN Outcome: Progressing   Problem: Clinical Measurements: Goal: Ability to maintain clinical measurements within normal limits will improve 12/28/2019 1155 by Ansel Bong, RN Outcome: Progressing 12/28/2019 1155 by Ansel Bong, RN Outcome: Progressing Goal: Will remain free from infection 12/28/2019 1155 by Ansel Bong, RN Outcome: Progressing 12/28/2019 1155 by Ansel Bong, RN Outcome: Progressing Goal: Diagnostic test results will improve 12/28/2019 1155 by Ansel Bong, RN Outcome: Progressing 12/28/2019 1155 by Ansel Bong, RN Outcome: Progressing Goal: Respiratory complications will improve 12/28/2019 1155 by Ansel Bong, RN Outcome: Progressing 12/28/2019 1155 by Ansel Bong, RN Outcome: Progressing Goal: Cardiovascular complication will be avoided 12/28/2019 1155 by Ansel Bong, RN Outcome: Progressing 12/28/2019 1155 by Ansel Bong, RN Outcome: Progressing   Problem: Activity: Goal: Risk for activity intolerance will decrease 12/28/2019 1155 by Ansel Bong, RN Outcome: Progressing 12/28/2019 1155 by Ansel Bong, RN Outcome: Progressing   Problem: Nutrition: Goal: Adequate nutrition will be maintained 12/28/2019 1155 by Ansel Bong, RN Outcome: Progressing 12/28/2019 1155 by Ansel Bong, RN Outcome: Progressing   Problem: Coping: Goal: Level of anxiety will decrease 12/28/2019 1155 by Ansel Bong, RN Outcome: Progressing 12/28/2019 1155 by Ansel Bong, RN Outcome: Progressing   Problem: Elimination: Goal: Will not experience complications related to bowel motility 12/28/2019 1155 by Ansel Bong, RN Outcome: Progressing 12/28/2019 1155 by Ansel Bong, RN Outcome: Progressing Goal: Will not experience complications related to urinary retention 12/28/2019 1155 by Ansel Bong, RN Outcome:  Progressing 12/28/2019 1155 by Ansel Bong, RN Outcome: Progressing   Problem: Pain Managment: Goal: General experience of comfort will improve 12/28/2019 1155 by Ansel Bong, RN Outcome: Progressing 12/28/2019 1155 by Ansel Bong, RN Outcome: Progressing   Problem: Safety: Goal: Ability to remain free from injury will improve 12/28/2019 1155 by Ansel Bong, RN Outcome: Progressing 12/28/2019 1155 by Ansel Bong, RN Outcome: Progressing   Problem: Skin Integrity: Goal: Risk for impaired skin integrity will decrease 12/28/2019 1155 by Ansel Bong,  RN Outcome: Progressing 12/28/2019 1155 by Ansel Bong, RN Outcome: Progressing

## 2019-12-28 NOTE — Progress Notes (Signed)
This RN provided discharge instructions and teaching to the patient. The patient verbalized and demonstrated an understanding of the provided instructions. All outstanding questions resolved. Pt provided with instructions on wound care and additional supplies for care at home. Pt demonstrated understanding of wound care instructions to LLE. L arm PIV removed. Cannula intact. Pt tolerated well. All belongings packed and in tow. Transport discharged pt via wheelchair to private vehicle.

## 2019-12-28 NOTE — Telephone Encounter (Signed)
Pt has been r/s  

## 2019-12-28 NOTE — Chronic Care Management (AMB) (Signed)
  Care Management   Note  12/28/2019 Name: Royce Stegman MRN: [id number] DOB: 03-03-1960  [name] [name] is a 59 y.o. year old male who is a primary care patient of [name], DO and is actively engaged with the care management team. I reached out to [name] by phone today to assist with re-scheduling a follow up visit with the Pharmacist  Follow up plan: Telephone appointment with care management team member scheduled for:01/23/2020  Penne Lash, RMA Care Guide, Embedded Care Coordination [facility]  [place], [place] [address] Direct Dial: [phone] [email or web address]

## 2019-12-28 NOTE — Progress Notes (Signed)
PT Cancellation Note  Patient Details Name: [name] [name] MRN: [id number] DOB: 1960-11-21   Cancelled Treatment:     Pt received in bed with CPAP donned. Pt initially not speaking to PT, only responding with head nods then eventually verbalizes & declines participation in tx, even exercises at bed level. Pt reports ankle pain and PT offers to assist pt with repositioning in bed but pt declines. Will f/u as able & when pt is willing.   Aleda Grana, PT, DPT 12/28/19, 2:52 PM    Sandi Mariscal 12/28/2019, 2:51 PM

## 2019-12-28 NOTE — Discharge Summary (Signed)
Physician Discharge Summary  [name] [name] TIR:[id number] DOB: 06/27/1960 DOA: 12/22/2019  PCP: [name], DO  Admit date: 12/22/2019 Discharge date: 12/28/2019  Discharge disposition: Home   Recommendations for Outpatient Follow-Up:   Follow-up with PCP in 1 week   Discharge Diagnosis:   Principal Problem:   Sepsis (HCC) Active Problems:   Acute on chronic respiratory failure with hypoxia (HCC)   OSA (obstructive sleep apnea)   HTN (hypertension)   Morbid obesity with BMI of 60.0-69.9, adult (HCC)   Chest pain   HLD (hyperlipidemia)   CAD (coronary artery disease)   Cellulitis of right lower extremity   Hypothyroidism   Degenerative joint disease of right hip   Chronic anticoagulation (warfarin  COUMADIN)   Shortness of breath   Atrial fibrillation, chronic (HCC)   Class 3 obesity with alveolar hypoventilation, serious comorbidity, and body mass index (BMI) of 60.0 to 69.9 in adult [facility])   Community acquired pneumonia    Discharge Condition: Stable.  Diet recommendation:  Diet Order            Diet - low sodium heart healthy           Diet Heart Room service appropriate? Yes; Fluid consistency: Thin  Diet effective now                   Code Status: Partial Code     Hospital Course:   Mr. [name] [name] is a 59 y.o. 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  He presented to the hospital because of cough productive of whitish sputum, chills, headache, chest pain and intermittent fever with T-max of 101 F.  He had been taking prednisone and Levaquin for about 3 days prior to admission without any improvement in his symptoms.  He was admitted to the hospital for acute on chronic hypoxemic respiratory failure, COPD exacerbation, sepsis secondary to left lower lobe pneumonia and right leg cellulitis.  He was initially treated with BiPAP and was subsequently transitioned to oxygen via nasal cannula.  He was treated with empiric IV antibiotics and bronchodilators.  He was seen by the wound care nurse for bilateral lower extremity lymphedema and chronic right leg wound.  He attributed to his migraine.  He responded nicely to Imitrex so he will be discharged on Imitrex.  His condition has improved and he is deemed stable for discharge to home.  Follow-up with PCP in 1 week is recommended.  It was also recommended that he goes to the pain clinic for management of chronic pain osteoarthritis.  He said he used to go to a local pain clinic but he was not happy with their services so he is planning to go to another pain clinic.  He was seen in the by PT and OT recommended home health therapy.  However, patient has had difficulty getting home health services because of Medicaid insurance.  His condition has improved and is deemed stable for discharge to home today.      Discharge Exam:  Vitals:   12/28/19 0421 12/28/19 0814 12/28/19 1226 12/28/19 1419  BP: 117/77 139/89 124/79   Pulse: 72 64 76   Resp: 18 19    Temp: 98.1 F (36.7 C) 97.8 F (36.6 C) 98.4 F (36.9 C)   TempSrc: Oral Oral Oral   SpO2: 98% 96% 100% 100%  Weight:         GEN: NAD SKIN: Chronic erythema of bilateral distal legs but erythema is more pronounced on the right leg. EYES: No pallor or icterus ENT: MMM CV: RRR PULM: CTA B ABD: soft, protuberant with  large pannus, NT, +BS CNS: AAO x 3, non focal EXT: Bilateral leg edema.  Erythema on bilateral legs.  Tenderness on the right leg is better.    The results of significant diagnostics from this hospitalization (including imaging, microbiology, ancillary and laboratory) are listed below for reference.     Procedures and Diagnostic Studies:   ECHOCARDIOGRAM COMPLETE  Result Date: 12/24/2019    ECHOCARDIOGRAM REPORT   Patient Name:   LAKE CINQUEMANI Date of Exam: 12/23/2019 Medical Rec #:  [id number]       Height:       68.0 in Accession #:    [id number]      Weight:       410.0 lb Date of Birth:  [birth year]       BSA:          2.773 m Patient Age:    59 years        BP:           158/99 mmHg Patient Gender: M               HR:           70 bpm. Exam Location:  [facility] Procedure: 2D Echo and Intracardiac Opacification Agent Indications:     Dyspnea  History:         Patient has prior history of Echocardiogram examinations.                  Cardiomyopathy and CHF, COPD; Arrythmias:Atrial Fibrillation.  Sonographer:     L Thornton-Maynard Referring Phys:  [id number] AMY N COX Diagnosing Phys: Harold Hedge MD  Sonographer Comments: Image acquisition challenging due to patient body habitus. IMPRESSIONS  1. Left ventricular ejection fraction, by estimation, is 55 to 60%. The left ventricle has normal function. Left ventricular endocardial border not optimally defined to evaluate regional wall motion. There is mild left ventricular hypertrophy. Left ventricular diastolic parameters were normal.  2. Right ventricular systolic function is normal. The right ventricular size is normal. There is normal pulmonary artery systolic pressure.  3. The mitral valve was not well visualized. Trivial mitral valve regurgitation.  4. The aortic valve was not well visualized. Aortic valve regurgitation is not visualized. FINDINGS  Left Ventricle: Left ventricular ejection fraction, by estimation, is 55 to 60%. The left ventricle has normal  function. Left ventricular endocardial border not optimally defined to evaluate regional wall motion. Definity contrast agent was given IV to delineate the left ventricular endocardial borders. The left ventricular internal cavity size was normal in size. There is mild left ventricular hypertrophy. Left ventricular diastolic parameters were normal. Right Ventricle: The right ventricular size is normal. No increase in right ventricular wall thickness. Right ventricular systolic function is normal. There is normal pulmonary artery systolic pressure. The tricuspid regurgitant velocity is 2.14 m/s, and  with an assumed right atrial pressure of 3 mmHg, the estimated right  ventricular systolic pressure is 21.3 mmHg. Left Atrium: Left atrial size was normal in size. Right Atrium: Right atrial size was normal in size. Pericardium: There is no evidence of pericardial effusion. Mitral Valve: The mitral valve was not well visualized. Trivial mitral valve regurgitation. Tricuspid Valve: The tricuspid valve is not well visualized. Tricuspid valve regurgitation is not demonstrated. Aortic Valve: The aortic valve was not well visualized. Aortic valve regurgitation is not visualized. Aortic valve mean gradient measures 6.0 mmHg. Aortic valve peak gradient measures 8.9 mmHg. Aortic valve area, by VTI measures 2.30 cm. Pulmonic Valve: The pulmonic valve was not well visualized. Pulmonic valve regurgitation is not visualized. Aorta: The aortic root was not well visualized. IAS/Shunts: The interatrial septum was not assessed.  LEFT VENTRICLE PLAX 2D LVOT diam:     2.30 cm  Diastology LV SV:         63       LV e' medial:    6.31 cm/s LV SV Index:   23       LV E/e' medial:  14.5 LVOT Area:     4.15 cm LV e' lateral:   11.70 cm/s                         LV E/e' lateral: 7.8  RIGHT VENTRICLE RV S prime:     16.00 cm/s TAPSE (M-mode): 2.3 cm LEFT ATRIUM             Index LA Vol (A2C):   84.0 ml 30.29 ml/m LA Vol (A4C):   91.1 ml 32.85  ml/m LA Biplane Vol: 87.1 ml 31.41 ml/m  AORTIC VALVE AV Area (Vmax):    2.24 cm AV Area (Vmean):   1.95 cm AV Area (VTI):     2.30 cm AV Vmax:           149.00 cm/s AV Vmean:          120.000 cm/s AV VTI:            0.275 m AV Peak Grad:      8.9 mmHg AV Mean Grad:      6.0 mmHg LVOT Vmax:         80.30 cm/s LVOT Vmean:        56.400 cm/s LVOT VTI:          0.152 m LVOT/AV VTI ratio: 0.55 MITRAL VALVE               TRICUSPID VALVE MV Area (PHT): 3.33 cm    TR Peak grad:   18.3 mmHg MV E velocity: 91.67 cm/s  TR Vmax:        214.00 cm/s                             SHUNTS                            Systemic VTI:  0.15 m                            Systemic Diam: 2.30 cm Harold Hedge MD Electronically signed by Harold Hedge MD Signature Date/Time: 12/24/2019/1:15:06 PM    Final      Labs:   Basic Metabolic Panel: Recent Labs  Lab 12/23/19 0537 12/23/19 4585 12/24/19 0423 12/24/19 0423 12/25/19 9292 12/25/19 0519 12/26/19  1610 12/28/19 0535 12/28/19 1349  NA 135  --  137  --  134*  --   --  136 133*  K 4.2   < > 3.9   < > 3.9   < >  --  4.1 4.3  CL 98  --  96*  --  88*  --   --  92* 90*  CO2 29  --  34*  --  33*  --   --  36* 31  GLUCOSE 171*  --  128*  --  155*  --   --  133* 93  BUN 15  --  22*  --  29*  --   --  32* 27*  CREATININE 0.87   < > 0.96  --  1.02  --  0.93 1.25* 1.05  CALCIUM 8.5*  --  8.7*  --  8.7*  --   --  9.5 9.4  MG  --   --  1.8  --   --   --   --   --   --    < > = values in this interval not displayed.   GFR Estimated Creatinine Clearance: 123.6 mL/min (by C-G formula based on SCr of 1.05 mg/dL). Liver Function Tests: Recent Labs  Lab 12/22/19 1259  AST 35  ALT 30  ALKPHOS 75  BILITOT 1.5*  PROT 6.5  ALBUMIN 3.1*   No results for input(s): LIPASE, AMYLASE in the last 168 hours. No results for input(s): AMMONIA in the last 168 hours. Coagulation profile Recent Labs  Lab 12/24/19 0423 12/25/19 0519 12/26/19 0504 12/27/19 0444 12/28/19 0535   INR 1.8* 2.0* 2.1* 1.9* 1.7*    CBC: Recent Labs  Lab 12/22/19 1259 12/23/19 0537 12/28/19 0535  WBC 9.3 7.2 6.8  NEUTROABS  --   --  4.1  HGB 13.1 13.2 11.5*  HCT 41.2 40.5 36.6*  MCV 93.4 91.4 94.1  PLT 219 204 236   Cardiac Enzymes: No results for input(s): CKTOTAL, CKMB, CKMBINDEX, TROPONINI in the last 168 hours. BNP: Invalid input(s): POCBNP CBG: No results for input(s): GLUCAP in the last 168 hours. D-Dimer No results for input(s): DDIMER in the last 72 hours. Hgb A1c No results for input(s): HGBA1C in the last 72 hours. Lipid Profile No results for input(s): CHOL, HDL, LDLCALC, TRIG, CHOLHDL, LDLDIRECT in the last 72 hours. Thyroid function studies No results for input(s): TSH, T4TOTAL, T3FREE, THYROIDAB in the last 72 hours.  Invalid input(s): FREET3 Anemia work up No results for input(s): VITAMINB12, FOLATE, FERRITIN, TIBC, IRON, RETICCTPCT in the last 72 hours. Microbiology Recent Results (from the past 240 hour(s))  Aerobic Culture (superficial specimen)     Status: None   Collection Time: 12/21/19  8:58 AM   Specimen: Wound  Result Value Ref Range Status   Specimen Description   Final    WOUND Performed at [facility], [address] [place], [place] [address]    Special Requests RIGHT LEG  Final   Gram Stain   Final    NO WBC SEEN ABUNDANT GRAM POSITIVE COCCI MODERATE GRAM POSITIVE RODS RARE GRAM NEGATIVE RODS Performed at [facility], [address] [place], [place] [address]    Culture   Final    ABUNDANT METHICILLIN RESISTANT STAPHYLOCOCCUS AUREUS RARE FLAVOBACTERIUM ODORATUM    Report Status 12/25/2019 FINAL  Final   Organism ID, Bacteria METHICILLIN RESISTANT STAPHYLOCOCCUS AUREUS  Final   Organism ID, Bacteria FLAVOBACTERIUM ODORATUM  Final  Susceptibility   Flavobacterium odoratum - MIC*    CEFAZOLIN >=64 RESISTANT Resistant     CEFTAZIDIME >=64 RESISTANT Resistant     CIPROFLOXACIN >=4 RESISTANT Resistant      GENTAMICIN >=16 RESISTANT Resistant     IMIPENEM 2 SENSITIVE Sensitive     TRIMETH/SULFA >=320 RESISTANT Resistant     PIP/TAZO >=128 RESISTANT Resistant     * RARE FLAVOBACTERIUM ODORATUM   Methicillin resistant staphylococcus aureus - MIC*    CIPROFLOXACIN >=8 RESISTANT Resistant     ERYTHROMYCIN >=8 RESISTANT Resistant     GENTAMICIN <=0.5 SENSITIVE Sensitive     OXACILLIN >=4 RESISTANT Resistant     TETRACYCLINE <=1 SENSITIVE Sensitive     VANCOMYCIN <=0.5 SENSITIVE Sensitive     TRIMETH/SULFA <=10 SENSITIVE Sensitive     CLINDAMYCIN <=0.25 SENSITIVE Sensitive     RIFAMPIN <=0.5 SENSITIVE Sensitive     Inducible Clindamycin NEGATIVE Sensitive     * ABUNDANT METHICILLIN RESISTANT STAPHYLOCOCCUS AUREUS  Respiratory Panel by RT PCR (Flu A&B, Covid) - Nasopharyngeal Swab     Status: None   Collection Time: 12/22/19  1:15 PM   Specimen: Nasopharyngeal Swab  Result Value Ref Range Status   SARS Coronavirus 2 by RT PCR NEGATIVE NEGATIVE Final    Comment: (NOTE) SARS-CoV-2 target nucleic acids are NOT DETECTED.  The SARS-CoV-2 RNA is generally detectable in upper respiratoy specimens during the acute phase of infection. The lowest concentration of SARS-CoV-2 viral copies this assay can detect is 131 copies/mL. A negative result does not preclude SARS-Cov-2 infection and should not be used as the sole basis for treatment or other patient management decisions. A negative result may occur with  improper specimen collection/handling, submission of specimen other than nasopharyngeal swab, presence of viral mutation(s) within the areas targeted by this assay, and inadequate number of viral copies (<131 copies/mL). A negative result must be combined with clinical observations, patient history, and epidemiological information. The expected result is Negative.  Fact Sheet for Patients:  https://www.moore.com/  Fact Sheet for Healthcare Providers:   https://www.young.biz/  This test is no t yet approved or cleared by the [place] FDA and  has been authorized for detection and/or diagnosis of SARS-CoV-2 by FDA under an Emergency Use Authorization (EUA). This EUA will remain  in effect (meaning this test can be used) for the duration of the COVID-19 declaration under Section 564(b)(1) of the Act, 21 U.S.C. section 360bbb-3(b)(1), unless the authorization is terminated or revoked sooner.     Influenza A by PCR NEGATIVE NEGATIVE Final   Influenza B by PCR NEGATIVE NEGATIVE Final    Comment: (NOTE) The Xpert Xpress SARS-CoV-2/FLU/RSV assay is intended as an aid in  the diagnosis of influenza from Nasopharyngeal swab specimens and  should not be used as a sole basis for treatment. Nasal washings and  aspirates are unacceptable for Xpert Xpress SARS-CoV-2/FLU/RSV  testing.  Fact Sheet for Patients: https://www.moore.com/  Fact Sheet for Healthcare Providers: https://www.young.biz/  This test is not yet approved or cleared by the [place] FDA and  has been authorized for detection and/or diagnosis of SARS-CoV-2 by  FDA under an Emergency Use Authorization (EUA). This EUA will remain  in effect (meaning this test can be used) for the duration of the  Covid-19 declaration under Section 564(b)(1) of the Act, 21  U.S.C. section 360bbb-3(b)(1), unless the authorization is  terminated or revoked. Performed at [facility], [address] [place], [place] [address]   CULTURE, BLOOD (ROUTINE  X 2) w Reflex to ID Panel     Status: None   Collection Time: 12/23/19 12:27 AM   Specimen: BLOOD  Result Value Ref Range Status   Specimen Description BLOOD BLOOD RIGHT HAND  Final   Special Requests   Final    BOTTLES DRAWN AEROBIC AND ANAEROBIC Blood Culture results may not be optimal due to an excessive volume of blood received in culture bottles   Culture   Final     NO GROWTH 5 DAYS Performed at [facility], [address] [place], [place] [address]    Report Status 12/28/2019 FINAL  Final  CULTURE, BLOOD (ROUTINE X 2) w Reflex to ID Panel     Status: None   Collection Time: 12/23/19 12:28 AM   Specimen: BLOOD  Result Value Ref Range Status   Specimen Description BLOOD BLOOD LEFT FOREARM  Final   Special Requests   Final    BOTTLES DRAWN AEROBIC ONLY Blood Culture results may not be optimal due to an inadequate volume of blood received in culture bottles   Culture   Final    NO GROWTH 5 DAYS Performed at [facility], [address] [place], [place] [address]    Report Status 12/28/2019 FINAL  Final  MRSA PCR Screening     Status: Abnormal   Collection Time: 12/24/19  5:09 PM   Specimen: Nasopharyngeal  Result Value Ref Range Status   MRSA by PCR POSITIVE (A) NEGATIVE Final    Comment:        The GeneXpert MRSA Assay (FDA approved for NASAL specimens only), is one component of a comprehensive MRSA colonization surveillance program. It is not intended to diagnose MRSA infection nor to guide or monitor treatment for MRSA infections. RESULT CALLED TO, READ BACK BY AND VERIFIED WITH: DAN MCCOOL @1914  ON 12/24/19 SKL Performed at [facility], [address] [place], [place]      Discharge Instructions:   Discharge Instructions    Diet - low sodium heart healthy   Complete by: As directed    Discharge wound care:   Complete by: As directed    Reapply compression stocking to left leg every day to maintain compression.  Reapply Ace wrap to right leg every morning to maintain compression.  Apply just behind toes to below knee in a spiral fashion.  Foam dressing to right leg and change every 3 days or as needed and cover with Ace wrap.   Increase activity slowly   Complete by: As directed      Allergies as of 12/28/2019   No Known Allergies     Medication List    STOP taking these  medications   doxycycline 100 MG capsule Commonly known as: VIBRAMYCIN   levofloxacin 500 MG tablet Commonly known as: LEVAQUIN   predniSONE 50 MG tablet Commonly known as: DELTASONE     TAKE these medications   albuterol 108 (90 Base) MCG/ACT inhaler Commonly known as: VENTOLIN HFA Inhale 2 puffs into the lungs every 4 (four) hours as needed for wheezing or shortness of breath.   allopurinol 100 MG tablet Commonly known as: ZYLOPRIM TAKE 1 TABLET BY MOUTH ONCE DAILY   atorvastatin 40 MG tablet Commonly known as: LIPITOR TAKE 1 TABLET BY MOUTH ONCE DAILY   DULoxetine 20 MG capsule Commonly known as: CYMBALTA Take 1 capsule (20 mg total) by mouth daily.   fluticasone 50 MCG/ACT nasal spray Commonly known as: FLONASE Place 2 sprays into  both nostrils 2 (two) times daily.   fluticasone furoate-vilanterol 100-25 MCG/INH Aepb Commonly known as: BREO ELLIPTA Inhale 1 puff into the lungs daily. Start taking on: December 29, 2019   HYDROcodone-acetaminophen 7.5-325 MG tablet Commonly known as: NORCO Take 1 tablet by mouth every 8 (eight) hours as needed for moderate pain or severe pain.   levothyroxine 50 MCG tablet Commonly known as: SYNTHROID Take 1 tablet (50 mcg total) by mouth daily before breakfast.   lisinopril 2.5 MG tablet Commonly known as: ZESTRIL Take 1 tablet (2.5 mg total) by mouth daily.   loperamide 2 MG capsule Commonly known as: IMODIUM Take 1 capsule (2 mg total) by mouth as needed for diarrhea or loose stools.   metoprolol succinate 50 MG 24 hr tablet Commonly known as: TOPROL-XL Take 1 tablet (50 mg total) by mouth daily. Take with or immediately following a meal.   montelukast 10 MG tablet Commonly known as: SINGULAIR Take 1 tablet (10 mg total) by mouth at bedtime.   naphazoline-glycerin 0.012-0.2 % Soln Commonly known as: CLEAR EYES REDNESS Place 1-2 drops into both eyes 4 (four) times daily as needed for eye irritation.   nitroGLYCERIN  0.4 MG SL tablet Commonly known as: NITROSTAT Place 1 tablet under tongue every 5 minutes as needed for chest pain. (No more than 3 doses within 15 minutes)   SUMAtriptan 50 MG tablet Commonly known as: IMITREX Take 1 tablet (50 mg total) by mouth every 2 (two) hours as needed for migraine or headache. May repeat in 2 hours if headache persists or recurs. No more than 2 doses in a day   thiamine 100 MG tablet Take 1 tablet (100 mg total) by mouth daily.   torsemide 20 MG tablet Commonly known as: DEMADEX Take 2 tablets (40 mg total) by mouth 2 (two) times daily.   traZODone 150 MG tablet Commonly known as: DESYREL Take 1 tablet (150 mg total) by mouth at bedtime.   warfarin 5 MG tablet Commonly known as: COUMADIN Take 5 mg by mouth daily.            Discharge Care Instructions  (From admission, onward)         Start     Ordered   12/28/19 0000  Discharge wound care:       Comments: Reapply compression stocking to left leg every day to maintain compression.  Reapply Ace wrap to right leg every morning to maintain compression.  Apply just behind toes to below knee in a spiral fashion.  Foam dressing to right leg and change every 3 days or as needed and cover with Ace wrap.   12/28/19 1453          Follow-up Information    [name], DO Follow up.   Specialty: Family Medicine Why: Please schedule hospital follow up appt within 3-5 days of discharge. Contact information: [address] [place] [address] [phone]                Time coordinating discharge: 35 minutes  Signed:  Lurene Shadow  [facility] 12/28/2019, 2:53 PM   Pager on [email or web address]. If 7PM-7AM, please contact night-coverage at [email or web address]

## 2019-12-28 NOTE — Progress Notes (Signed)
ANTICOAGULATION CONSULT NOTE  Pharmacy Consult for Warfarin Dosing Indication: atrial fibrillation  Patient Measurements: Weight: (!) 186 kg (410 lb)   Vital Signs: Temp: 98.1 F (36.7 C) (11/12 0421) Temp Source: Oral (11/12 0421) BP: 117/77 (11/12 0421) Pulse Rate: 72 (11/12 0421)  Labs: Recent Labs    12/26/19 0504 12/27/19 0444 12/28/19 0535  HGB  --   --  11.5*  HCT  --   --  36.6*  PLT  --   --  236  LABPROT 23.0* 21.3* 19.1*  INR 2.1* 1.9* 1.7*  CREATININE 0.93  --  1.25*    Estimated Creatinine Clearance: 103.9 mL/min (A) (by C-G formula based on SCr of 1.25 mg/dL (H)).   Medical History: Past Medical History:  Diagnosis Date  . Allergy   . Anxiety   . Arthritis   . Asthma   . Brain damage   . Chronic pain of both knees 07/13/2018  . Clotting disorder (HCC)   . COPD (chronic obstructive pulmonary disease) (HCC)   . Depression   . GERD (gastroesophageal reflux disease)   . HFrEF (heart failure with reduced ejection fraction) (HCC)    a. 03/2018 Echo: EF 25-30%, diff HK. Mod LAE.  [name] History of DVT (deep vein thrombosis)   . History of pulmonary embolism    a. Chronic coumadin.  [name] Hypertension   . MI (myocardial infarction) (HCC)   . Morbid obesity (HCC)   . Neck pain 07/13/2018  . NICM (nonischemic cardiomyopathy) (HCC)    a. s/p Cath x 3 - reportedly nl cors. Last cath 2019 in [place]; b. a. 03/2018 Echo: EF 25-30%, diff HK.  [name] Persistent atrial fibrillation (HCC)    a. 03/2018 s/p DCCV; b. CHA2DS2VASc = 1-->Xarelto (later changed to warfarin); c. 05/2018 recurrent afib-->Amio initiated.  . Sleep apnea   . Sleep apnea     Home Dose: Warfarin 5mg  daily  Assessment: Patient is a 59yo male admitted for shortness of breath. Patient has a history of afib and takes warfarin for stroke prevention. Pharmacy consulted for Warfarin dosing. INR on admission was subtherapeutic. I spoke with the patient and he admits to missing at least one dose PTA, which would  explain the low initial INR, H&H, platelets stable  Date   INR   Dose 11/06   1.3   7.5 mg  11/07   1.2   7.5 mg 11/08   1.8   7.5 mg 11/09   2.0   7.5 mg 11/10   2.1   5 mg 11/11   1.9   7.5 mg 11/12   1.7   10 mg  DDIs: ceftriaxone, vancomycin, allopurinol (on at home)  Goal of Therapy:  INR 2-3 Monitor platelets by anticoagulation protocol: Yes   Plan:   INR sub-therapeutic and trending down  warfarin 10 mg tonight   Daily INR checks  CBC at least every 3 days per protocol  13/12, PharmD, BCPS Clinical Pharmacist 12/28/2019 7:30 AM

## 2019-12-28 NOTE — TOC Transition Note (Signed)
Transition of Care [facility]) - CM/SW Discharge Note   Patient Details  Name: [name] [name] MRN: [id number] Date of Birth: January 25, 1961  Transition of Care [facility]) CM/SW Contact:  Margarito Liner, LCSW Phone Number: 12/28/2019, 3:35 PM   Clinical Narrative: Patient has orders to discharge home today. Per RN, he does have a ride home. No further concerns. CSW signing off.    Final next level of care: Home/Self Care Barriers to Discharge: Barriers Resolved   Patient Goals and CMS Choice        Discharge Placement                    Patient and family notified of of transfer: 12/28/19  Discharge Plan and Services     Post Acute Care Choice: NA                               Social Determinants of Health (SDOH) Interventions     Readmission Risk Interventions Readmission Risk Prevention Plan 12/24/2019 01/25/2019 10/25/2018  Transportation Screening Complete Complete Complete  PCP or Specialist Appt within 3-5 Days - - Complete  HRI or Home Care Consult - - Complete  Social Work Consult for Recovery Care Planning/Counseling - - Complete  Palliative Care Screening - - Not Applicable  Medication Review Ocean[facility]) Complete Complete Complete  PCP or Specialist appointment within 3-5 days of discharge Complete Complete -  HRI or Home Care Consult - Complete -  SW Recovery Care/Counseling Consult Complete Complete -  Palliative Care Screening Not Applicable Not Applicable -  Skilled Nursing Facility Not Applicable Not Applicable -

## 2020-01-07 ENCOUNTER — Inpatient Hospital Stay
Admission: EM | Admit: 2020-01-07 | Discharge: 2020-01-12 | DRG: 291 | Disposition: A | Discharge status: Home / Self Care | Payer: Medicaid Other | Attending: Internal Medicine | Admitting: Internal Medicine

## 2020-01-07 ENCOUNTER — Emergency Department: Payer: Medicaid Other

## 2020-01-07 DIAGNOSIS — I5033 Acute on chronic diastolic (congestive) heart failure: Secondary | ICD-10-CM | POA: Diagnosis present

## 2020-01-07 DIAGNOSIS — F32A Depression, unspecified: Secondary | ICD-10-CM | POA: Diagnosis present

## 2020-01-07 DIAGNOSIS — Z79899 Other long term (current) drug therapy: Secondary | ICD-10-CM

## 2020-01-07 DIAGNOSIS — F341 Dysthymic disorder: Secondary | ICD-10-CM

## 2020-01-07 DIAGNOSIS — L03115 Cellulitis of right lower limb: Secondary | ICD-10-CM | POA: Diagnosis present

## 2020-01-07 DIAGNOSIS — K819 Cholecystitis, unspecified: Secondary | ICD-10-CM | POA: Diagnosis not present

## 2020-01-07 DIAGNOSIS — G894 Chronic pain syndrome: Secondary | ICD-10-CM | POA: Diagnosis not present

## 2020-01-07 DIAGNOSIS — I2510 Atherosclerotic heart disease of native coronary artery without angina pectoris: Secondary | ICD-10-CM | POA: Diagnosis present

## 2020-01-07 DIAGNOSIS — Z79890 Hormone replacement therapy (postmenopausal): Secondary | ICD-10-CM

## 2020-01-07 DIAGNOSIS — Z86711 Personal history of pulmonary embolism: Secondary | ICD-10-CM | POA: Diagnosis not present

## 2020-01-07 DIAGNOSIS — E785 Hyperlipidemia, unspecified: Secondary | ICD-10-CM | POA: Diagnosis not present

## 2020-01-07 DIAGNOSIS — J9622 Acute and chronic respiratory failure with hypercapnia: Secondary | ICD-10-CM | POA: Diagnosis present

## 2020-01-07 DIAGNOSIS — I428 Other cardiomyopathies: Secondary | ICD-10-CM | POA: Diagnosis present

## 2020-01-07 DIAGNOSIS — I110 Hypertensive heart disease with heart failure: Secondary | ICD-10-CM | POA: Diagnosis not present

## 2020-01-07 DIAGNOSIS — R6521 Severe sepsis with septic shock: Secondary | ICD-10-CM | POA: Diagnosis not present

## 2020-01-07 DIAGNOSIS — E662 Morbid (severe) obesity with alveolar hypoventilation: Secondary | ICD-10-CM | POA: Diagnosis present

## 2020-01-07 DIAGNOSIS — R6520 Severe sepsis without septic shock: Secondary | ICD-10-CM | POA: Diagnosis not present

## 2020-01-07 DIAGNOSIS — F411 Generalized anxiety disorder: Secondary | ICD-10-CM | POA: Diagnosis present

## 2020-01-07 DIAGNOSIS — I482 Chronic atrial fibrillation, unspecified: Secondary | ICD-10-CM | POA: Diagnosis not present

## 2020-01-07 DIAGNOSIS — M109 Gout, unspecified: Secondary | ICD-10-CM | POA: Diagnosis not present

## 2020-01-07 DIAGNOSIS — J9621 Acute and chronic respiratory failure with hypoxia: Secondary | ICD-10-CM | POA: Diagnosis present

## 2020-01-07 DIAGNOSIS — I82409 Acute embolism and thrombosis of unspecified deep veins of unspecified lower extremity: Secondary | ICD-10-CM

## 2020-01-07 DIAGNOSIS — Z20822 Contact with and (suspected) exposure to covid-19: Secondary | ICD-10-CM | POA: Diagnosis present

## 2020-01-07 DIAGNOSIS — M1990 Unspecified osteoarthritis, unspecified site: Secondary | ICD-10-CM | POA: Diagnosis present

## 2020-01-07 DIAGNOSIS — Z6841 Body Mass Index (BMI) 40.0 and over, adult: Secondary | ICD-10-CM

## 2020-01-07 DIAGNOSIS — E66813 Obesity, class 3: Secondary | ICD-10-CM

## 2020-01-07 DIAGNOSIS — E872 Acidosis: Secondary | ICD-10-CM | POA: Diagnosis not present

## 2020-01-07 DIAGNOSIS — J449 Chronic obstructive pulmonary disease, unspecified: Secondary | ICD-10-CM | POA: Diagnosis present

## 2020-01-07 DIAGNOSIS — G4700 Insomnia, unspecified: Secondary | ICD-10-CM | POA: Diagnosis present

## 2020-01-07 DIAGNOSIS — I82403 Acute embolism and thrombosis of unspecified deep veins of lower extremity, bilateral: Secondary | ICD-10-CM | POA: Diagnosis not present

## 2020-01-07 DIAGNOSIS — R079 Chest pain, unspecified: Secondary | ICD-10-CM | POA: Diagnosis present

## 2020-01-07 DIAGNOSIS — Z8249 Family history of ischemic heart disease and other diseases of the circulatory system: Secondary | ICD-10-CM

## 2020-01-07 DIAGNOSIS — I10 Essential (primary) hypertension: Secondary | ICD-10-CM | POA: Diagnosis not present

## 2020-01-07 DIAGNOSIS — Z801 Family history of malignant neoplasm of trachea, bronchus and lung: Secondary | ICD-10-CM

## 2020-01-07 DIAGNOSIS — A419 Sepsis, unspecified organism: Secondary | ICD-10-CM

## 2020-01-07 DIAGNOSIS — Z86718 Personal history of other venous thrombosis and embolism: Secondary | ICD-10-CM | POA: Diagnosis not present

## 2020-01-07 DIAGNOSIS — K219 Gastro-esophageal reflux disease without esophagitis: Secondary | ICD-10-CM | POA: Diagnosis present

## 2020-01-07 DIAGNOSIS — I517 Cardiomegaly: Secondary | ICD-10-CM | POA: Diagnosis not present

## 2020-01-07 DIAGNOSIS — R1011 Right upper quadrant pain: Secondary | ICD-10-CM

## 2020-01-07 DIAGNOSIS — I252 Old myocardial infarction: Secondary | ICD-10-CM

## 2020-01-07 DIAGNOSIS — F419 Anxiety disorder, unspecified: Secondary | ICD-10-CM | POA: Diagnosis not present

## 2020-01-07 DIAGNOSIS — R000 Tachycardia, unspecified: Secondary | ICD-10-CM | POA: Diagnosis present

## 2020-01-07 DIAGNOSIS — M7989 Other specified soft tissue disorders: Secondary | ICD-10-CM | POA: Diagnosis present

## 2020-01-07 DIAGNOSIS — J80 Acute respiratory distress syndrome: Secondary | ICD-10-CM | POA: Diagnosis not present

## 2020-01-07 DIAGNOSIS — L03116 Cellulitis of left lower limb: Secondary | ICD-10-CM | POA: Diagnosis present

## 2020-01-07 DIAGNOSIS — R0789 Other chest pain: Secondary | ICD-10-CM | POA: Diagnosis not present

## 2020-01-07 DIAGNOSIS — E039 Hypothyroidism, unspecified: Secondary | ICD-10-CM | POA: Diagnosis not present

## 2020-01-07 DIAGNOSIS — Z7901 Long term (current) use of anticoagulants: Secondary | ICD-10-CM

## 2020-01-07 DIAGNOSIS — I890 Lymphedema, not elsewhere classified: Secondary | ICD-10-CM | POA: Diagnosis present

## 2020-01-07 DIAGNOSIS — H9192 Unspecified hearing loss, left ear: Secondary | ICD-10-CM | POA: Diagnosis present

## 2020-01-07 DIAGNOSIS — E119 Type 2 diabetes mellitus without complications: Secondary | ICD-10-CM | POA: Diagnosis present

## 2020-01-07 DIAGNOSIS — Z87820 Personal history of traumatic brain injury: Secondary | ICD-10-CM

## 2020-01-07 DIAGNOSIS — R0600 Dyspnea, unspecified: Secondary | ICD-10-CM | POA: Diagnosis not present

## 2020-01-07 DIAGNOSIS — R069 Unspecified abnormalities of breathing: Secondary | ICD-10-CM | POA: Diagnosis not present

## 2020-01-07 LAB — CBC WITH DIFFERENTIAL/PLATELET
Abs Immature Granulocytes: 0.03 10*3/uL (ref 0.00–0.07)
Basophils Absolute: 0.1 10*3/uL (ref 0.0–0.1)
Basophils Relative: 1 %
Eosinophils Absolute: 0.3 10*3/uL (ref 0.0–0.5)
Eosinophils Relative: 4 %
HCT: 39.9 % (ref 39.0–52.0)
Hemoglobin: 12.8 g/dL — ABNORMAL LOW (ref 13.0–17.0)
Immature Granulocytes: 0 %
Lymphocytes Relative: 20 %
Lymphs Abs: 1.7 10*3/uL (ref 0.7–4.0)
MCH: 29.4 pg (ref 26.0–34.0)
MCHC: 32.1 g/dL (ref 30.0–36.0)
MCV: 91.5 fL (ref 80.0–100.0)
Monocytes Absolute: 0.7 10*3/uL (ref 0.1–1.0)
Monocytes Relative: 8 %
Neutro Abs: 5.8 10*3/uL (ref 1.7–7.7)
Neutrophils Relative %: 67 %
Platelets: 282 10*3/uL (ref 150–400)
RBC: 4.36 MIL/uL (ref 4.22–5.81)
RDW: 15.8 % — ABNORMAL HIGH (ref 11.5–15.5)
WBC: 8.5 10*3/uL (ref 4.0–10.5)
nRBC: 0 % (ref 0.0–0.2)

## 2020-01-07 LAB — RESP PANEL BY RT-PCR (FLU A&B, COVID) ARPGX2
Influenza A by PCR: NEGATIVE
Influenza B by PCR: NEGATIVE
SARS Coronavirus 2 by RT PCR: NEGATIVE

## 2020-01-07 LAB — COMPREHENSIVE METABOLIC PANEL
ALT: 22 U/L (ref 0–44)
AST: 45 U/L — ABNORMAL HIGH (ref 15–41)
Albumin: 3.4 g/dL — ABNORMAL LOW (ref 3.5–5.0)
Alkaline Phosphatase: 73 U/L (ref 38–126)
Anion gap: 15 (ref 5–15)
BUN: 12 mg/dL (ref 6–20)
CO2: 24 mmol/L (ref 22–32)
Calcium: 7.8 mg/dL — ABNORMAL LOW (ref 8.9–10.3)
Chloride: 98 mmol/L (ref 98–111)
Creatinine, Ser: 0.86 mg/dL (ref 0.61–1.24)
GFR, Estimated: >60 mL/min (ref >60)
Glucose, Bld: 159 mg/dL — ABNORMAL HIGH (ref 70–99)
Potassium: 4.1 mmol/L (ref 3.5–5.1)
Sodium: 137 mmol/L (ref 135–145)
Total Bilirubin: 0.8 mg/dL (ref 0.3–1.2)
Total Protein: 6.8 g/dL (ref 6.5–8.1)

## 2020-01-07 LAB — URINALYSIS, COMPLETE (UACMP) WITH MICROSCOPIC
Bacteria, UA: NONE SEEN
Bilirubin Urine: NEGATIVE
Glucose, UA: NEGATIVE mg/dL
Ketones, ur: NEGATIVE mg/dL
Nitrite: NEGATIVE
Protein, ur: NEGATIVE mg/dL
Specific Gravity, Urine: 1.009 (ref 1.005–1.030)
Squamous Epithelial / HPF: NONE SEEN (ref 0–5)
pH: 7 (ref 5.0–8.0)

## 2020-01-07 LAB — TROPONIN I (HIGH SENSITIVITY)
Troponin I (High Sensitivity): 77 ng/L — ABNORMAL HIGH (ref <18)
Troponin I (High Sensitivity): 90 ng/L — ABNORMAL HIGH (ref <18)

## 2020-01-07 LAB — MRSA PCR SCREENING: MRSA by PCR: NEGATIVE

## 2020-01-07 LAB — GLUCOSE, CAPILLARY: Glucose-Capillary: 137 mg/dL — ABNORMAL HIGH (ref 70–99)

## 2020-01-07 LAB — BRAIN NATRIURETIC PEPTIDE: B Natriuretic Peptide: 141.3 pg/mL — ABNORMAL HIGH (ref 0.0–100.0)

## 2020-01-07 LAB — LACTIC ACID, PLASMA
Lactic Acid, Venous: 5.7 mmol/L (ref 0.5–1.9)
Lactic Acid, Venous: 6.2 mmol/L (ref 0.5–1.9)
Lactic Acid, Venous: 5.3 mmol/L (ref 0.5–1.9)

## 2020-01-07 LAB — PROTIME-INR
INR: 1.2 (ref 0.8–1.2)
Prothrombin Time: 14.4 seconds (ref 11.4–15.2)

## 2020-01-07 MED ORDER — TRAZODONE HCL 50 MG PO TABS
150.0000 mg | ORAL_TABLET | Freq: Every day | ORAL | Status: DC
  Administered 2020-01-07 – 2020-01-11 (×5): 150 mg via ORAL
  Filled 2020-01-07 (×6): qty 1

## 2020-01-07 MED ORDER — ALLOPURINOL 100 MG PO TABS
100.0000 mg | ORAL_TABLET | Freq: Every day | ORAL | Status: DC
  Administered 2020-01-08 – 2020-01-12 (×5): 100 mg via ORAL
  Filled 2020-01-07 (×5): qty 1

## 2020-01-07 MED ORDER — CHLORHEXIDINE GLUCONATE 0.12 % MT SOLN
15.0000 mL | Freq: Two times a day (BID) | OROMUCOSAL | Status: DC
  Administered 2020-01-08 – 2020-01-11 (×5): 15 mL via OROMUCOSAL
  Filled 2020-01-07 (×7): qty 15

## 2020-01-07 MED ORDER — ENOXAPARIN SODIUM 40 MG/0.4ML ~~LOC~~ SOLN: 40.0000 mg | SUBCUTANEOUS | Status: DC

## 2020-01-07 MED ORDER — ATORVASTATIN CALCIUM 20 MG PO TABS
40.0000 mg | ORAL_TABLET | Freq: Every day | ORAL | Status: DC
  Administered 2020-01-08 – 2020-01-12 (×5): 40 mg via ORAL
  Filled 2020-01-07 (×5): qty 2

## 2020-01-07 MED ORDER — ALBUTEROL SULFATE HFA 108 (90 BASE) MCG/ACT IN AERS
2.0000 | INHALATION_SPRAY | RESPIRATORY_TRACT | Status: DC | PRN
  Filled 2020-01-07: qty 6.7

## 2020-01-07 MED ORDER — ONDANSETRON HCL 4 MG/2ML IJ SOLN
INTRAMUSCULAR | Status: AC
  Filled 2020-01-07: qty 2

## 2020-01-07 MED ORDER — MONTELUKAST SODIUM 10 MG PO TABS
10.0000 mg | ORAL_TABLET | Freq: Every day | ORAL | Status: DC
  Administered 2020-01-07 – 2020-01-11 (×5): 10 mg via ORAL
  Filled 2020-01-07 (×5): qty 1

## 2020-01-07 MED ORDER — WARFARIN - PHARMACIST DOSING INPATIENT: Freq: Every day | Status: DC

## 2020-01-07 MED ORDER — HEPARIN BOLUS VIA INFUSION
4000.0000 [IU] | Freq: Once | INTRAVENOUS | Status: AC
  Administered 2020-01-07: 4000 [IU] via INTRAVENOUS
  Filled 2020-01-07: qty 4000

## 2020-01-07 MED ORDER — ONDANSETRON HCL 4 MG/2ML IJ SOLN
4.0000 mg | Freq: Four times a day (QID) | INTRAMUSCULAR | Status: DC | PRN
  Administered 2020-01-07 – 2020-01-11 (×4): 4 mg via INTRAVENOUS
  Filled 2020-01-07 (×3): qty 2

## 2020-01-07 MED ORDER — SODIUM CHLORIDE 0.9 % IV SOLN
2.0000 g | INTRAVENOUS | Status: DC
  Administered 2020-01-07 – 2020-01-08 (×2): 2 g via INTRAVENOUS
  Filled 2020-01-07: qty 2
  Filled 2020-01-07: qty 20
  Filled 2020-01-07: qty 2

## 2020-01-07 MED ORDER — FLUTICASONE FUROATE-VILANTEROL 100-25 MCG/INH IN AEPB
1.0000 | INHALATION_SPRAY | Freq: Every day | RESPIRATORY_TRACT | Status: DC
  Administered 2020-01-07 – 2020-01-12 (×5): 1 via RESPIRATORY_TRACT
  Filled 2020-01-07: qty 28

## 2020-01-07 MED ORDER — METOPROLOL TARTRATE 5 MG/5ML IV SOLN
5.0000 mg | Freq: Once | INTRAVENOUS | Status: AC
  Administered 2020-01-07: 5 mg via INTRAVENOUS

## 2020-01-07 MED ORDER — METOPROLOL SUCCINATE ER 50 MG PO TB24
50.0000 mg | ORAL_TABLET | Freq: Every day | ORAL | Status: DC
  Administered 2020-01-07 – 2020-01-09 (×3): 50 mg via ORAL
  Filled 2020-01-07 (×3): qty 1

## 2020-01-07 MED ORDER — ALUM & MAG HYDROXIDE-SIMETH 200-200-20 MG/5ML PO SUSP
30.0000 mL | Freq: Four times a day (QID) | ORAL | Status: DC | PRN
  Administered 2020-01-07: 30 mL via ORAL
  Filled 2020-01-07: qty 30

## 2020-01-07 MED ORDER — SODIUM CHLORIDE 0.9 % IV SOLN
2.0000 g | Freq: Once | INTRAVENOUS | Status: AC
  Administered 2020-01-07: 2 g via INTRAVENOUS
  Filled 2020-01-07: qty 2

## 2020-01-07 MED ORDER — LACTATED RINGERS IV BOLUS (SEPSIS)
1000.0000 mL | Freq: Once | INTRAVENOUS | Status: AC
  Administered 2020-01-07: 1000 mL via INTRAVENOUS

## 2020-01-07 MED ORDER — LEVOTHYROXINE SODIUM 50 MCG PO TABS
50.0000 ug | ORAL_TABLET | Freq: Every day | ORAL | Status: DC
  Administered 2020-01-08 – 2020-01-09 (×2): 50 ug via ORAL
  Filled 2020-01-07 (×2): qty 1

## 2020-01-07 MED ORDER — SUMATRIPTAN SUCCINATE 50 MG PO TABS
50.0000 mg | ORAL_TABLET | ORAL | Status: DC | PRN
  Administered 2020-01-10 – 2020-01-12 (×4): 50 mg via ORAL
  Filled 2020-01-07 (×7): qty 1

## 2020-01-07 MED ORDER — WARFARIN SODIUM 5 MG PO TABS: 5.0000 mg | ORAL_TABLET | Freq: Every day | ORAL | Status: DC

## 2020-01-07 MED ORDER — CHLORHEXIDINE GLUCONATE CLOTH 2 % EX PADS
6.0000 | MEDICATED_PAD | Freq: Every day | CUTANEOUS | Status: DC
  Administered 2020-01-07 – 2020-01-11 (×4): 6 via TOPICAL
  Filled 2020-01-07: qty 6

## 2020-01-07 MED ORDER — VANCOMYCIN HCL IN DEXTROSE 1-5 GM/200ML-% IV SOLN
1000.0000 mg | Freq: Once | INTRAVENOUS | Status: AC
  Administered 2020-01-07: 1000 mg via INTRAVENOUS

## 2020-01-07 MED ORDER — HEPARIN (PORCINE) 25000 UT/250ML-% IV SOLN
2250.0000 [IU]/h | INTRAVENOUS | Status: AC
  Administered 2020-01-07: 1600 [IU]/h via INTRAVENOUS
  Administered 2020-01-08 (×2): 1950 [IU]/h via INTRAVENOUS
  Administered 2020-01-09: 2100 [IU]/h via INTRAVENOUS
  Filled 2020-01-07 (×4): qty 250

## 2020-01-07 MED ORDER — PANTOPRAZOLE SODIUM 40 MG IV SOLR
40.0000 mg | Freq: Every day | INTRAVENOUS | Status: DC
  Administered 2020-01-07 – 2020-01-11 (×5): 40 mg via INTRAVENOUS
  Filled 2020-01-07 (×5): qty 40

## 2020-01-07 MED ORDER — ORAL CARE MOUTH RINSE
15.0000 mL | Freq: Two times a day (BID) | OROMUCOSAL | Status: DC
  Administered 2020-01-08 – 2020-01-09 (×4): 15 mL via OROMUCOSAL

## 2020-01-07 MED ORDER — WARFARIN SODIUM 7.5 MG PO TABS
7.5000 mg | ORAL_TABLET | Freq: Once | ORAL | Status: DC
  Filled 2020-01-07: qty 1

## 2020-01-07 MED ORDER — THIAMINE HCL 100 MG PO TABS
100.0000 mg | ORAL_TABLET | Freq: Every day | ORAL | Status: DC
  Administered 2020-01-08 – 2020-01-12 (×5): 100 mg via ORAL
  Filled 2020-01-07 (×5): qty 1

## 2020-01-07 MED ORDER — SODIUM CHLORIDE 0.9 % IV SOLN: INTRAVENOUS | Status: DC

## 2020-01-07 MED ORDER — HYDROCODONE-ACETAMINOPHEN 7.5-325 MG PO TABS
1.0000 | ORAL_TABLET | Freq: Three times a day (TID) | ORAL | Status: DC | PRN
  Administered 2020-01-07 – 2020-01-12 (×12): 1 via ORAL
  Filled 2020-01-07 (×12): qty 1

## 2020-01-07 MED ORDER — VANCOMYCIN HCL IN DEXTROSE 1-5 GM/200ML-% IV SOLN
1000.0000 mg | Freq: Once | INTRAVENOUS | Status: DC
  Filled 2020-01-07: qty 200

## 2020-01-07 MED ORDER — FUROSEMIDE 10 MG/ML IJ SOLN
80.0000 mg | Freq: Once | INTRAMUSCULAR | Status: AC
  Administered 2020-01-07: 80 mg via INTRAVENOUS
  Filled 2020-01-07: qty 8

## 2020-01-07 MED ORDER — NITROGLYCERIN 0.4 MG SL SUBL: 0.4000 mg | SUBLINGUAL_TABLET | SUBLINGUAL | Status: DC | PRN

## 2020-01-07 MED ORDER — LISINOPRIL 5 MG PO TABS
2.5000 mg | ORAL_TABLET | Freq: Every day | ORAL | Status: DC
  Administered 2020-01-08 – 2020-01-09 (×2): 2.5 mg via ORAL
  Filled 2020-01-07 (×2): qty 1

## 2020-01-07 MED ORDER — VANCOMYCIN HCL 1500 MG/300ML IV SOLN
1500.0000 mg | Freq: Once | INTRAVENOUS | Status: DC
  Filled 2020-01-07: qty 300

## 2020-01-07 MED ORDER — DULOXETINE HCL 20 MG PO CPEP
20.0000 mg | ORAL_CAPSULE | Freq: Every day | ORAL | Status: DC
  Administered 2020-01-08 – 2020-01-12 (×5): 20 mg via ORAL
  Filled 2020-01-07 (×5): qty 1

## 2020-01-07 NOTE — Progress Notes (Signed)
PHARMACY -  BRIEF ANTIBIOTIC NOTE     Pharmacy has received consult(s) for cefepime and vancomycin from an ED provider for pneumonia.  The patient's profile has been reviewed for ht/wt/allergies/indication/available labs.     One time order(s) placed for Cefepime 2gm and total loading dose of Vancomycin 2500 mg (based on TBW of 186kg from 12/27/19)  Further antibiotics/pharmacy consults should be ordered by admitting physician if indicated.                       Thank you, Otelia Sergeant, PharmD, [facility] 01/07/2020 5:27 PM

## 2020-01-07 NOTE — ED Notes (Signed)
Pt soiled and urinated all on himself ed rn changed patients bed, assisted pt to commode

## 2020-01-07 NOTE — H&P (Addendum)
History and Physical   [name] [name]:[id number] DOB: Dec 17, 1960 DOA: 01/07/2020  PCP: [name], DO  Outpatient Specialists: [facility]) Patient coming from: home  I have personally briefly reviewed patient's old medical records in [facility] EMR.  Chief Concern: Shortness of breath  HPI: [name] [name] is a 59 y.o. 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  He presented to the emergency department for chief concerns of acute onset shortness of breath at 01/07/2020.  He endorses chest pain and abdominal pain.  He states that the chest pain is dull and persistent.  He denies numbness to the upper extremities.  Further H&P is limited due to patient being on BiPAP.  Review of system was negative for headache, fever, chills, dysphagia, vision changes, hearing changes, vomiting, dysuria, hematuria, diarrhea.  He endorses nausea.  ED Course: Discussed with ED provider requesting admission for possible sepsis  Review of Systems: As per HPI otherwise 10 point review of systems negative.  Assessment/Plan  Principal Problem:   Sepsis (HCC) Active Problems:   Acute on chronic respiratory failure with hypoxia (HCC)   Chronic pain syndrome   Chest pain   CAD (coronary artery disease)   Cellulitis of right lower extremity   Hypothyroidism   Chronic anticoagulation (warfarin  COUMADIN)   Depression   GAD (generalized anxiety disorder)   Atrial fibrillation, chronic (HCC)   Class 3 obesity with alveolar hypoventilation, serious comorbidity, and body mass index (BMI) of 60.0 to 69.9 in adult  [facility])   Sepsis-not ruled out, work-up in progress for etiology -Differential includes cellulitis versus urinary tract infection versus bacteremia -Elevated lactic acid, positive uptrending troponin -UA positive for trace leukocytes -Follow urine culture -Follow blood cultures -Broad-spectrum antibiotic with IV ceftriaxone and Vanco  Left lower leg swelling-differential includes cellulitis versus DVT -Ultrasound of lower extremity ordered possible DVT  Acute on chronic respiratory failure-suspect secondary to sepsis -Patient requiring BiPAP, admit to stepdown  Elevated troponin-and A. fib with RVR -Positive delta in setting of chest pain -Sublingual nitroglycerin 0.4 mg minimally reduces the pain -Cardiology Dr. Elberta Fortis has been consulted and he will follow -Heparin GTT started per cardiology  Hyperlipidemia-atorvastatin 40 mg daily  History of hypertension-lisinopril 2.5 mg daily, metoprolol succinate 50 mg daily  Obstructive sleep apnea-currently on BiPAP, normally wears CPAP at night  History of gout-continue allopurinol 100 mg daily  Depression/anxiety-duloxetine 20 mg daily Insomnia/depression-trazodone 150 mg daily nightly Hypothyroid levothyroxine 50 mcg daily Chronic A. 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   DVT prophylaxis: On Coumadin Code Status: Full code Diet: Heart healthy/carb modified Family Communication: No Disposition Plan: Pending clinical course Consults called: Cardiology Admission status: Inpatient to stepdown  Past Medical History:  Diagnosis Date  . Allergy   . Anxiety   . Arthritis   . Asthma   . Brain damage   . Chronic pain of both knees 07/13/2018  . Clotting disorder (HCC)   . COPD (chronic obstructive pulmonary disease) (HCC)   . Depression   . GERD (gastroesophageal reflux disease)   . HFrEF  (heart failure with reduced ejection  fraction) (HCC)    a. 03/2018 Echo: EF 25-30%, diff HK. Mod LAE.  [name] History of DVT (deep vein thrombosis)   . History of pulmonary embolism    a. Chronic coumadin.  [name] Hypertension   . MI (myocardial infarction) (HCC)   . Morbid obesity (HCC)   . Neck pain 07/13/2018  . NICM (nonischemic cardiomyopathy) (HCC)    a. s/p Cath x 3 - reportedly nl cors. Last cath 2019 in [place]; b. a. 03/2018 Echo: EF 25-30%, diff HK.  [name] Persistent atrial fibrillation (HCC)    a. 03/2018 s/p DCCV; b. CHA2DS2VASc = 1-->Xarelto (later changed to warfarin); c. 05/2018 recurrent afib-->Amio initiated.  . Sleep apnea   . Sleep apnea    Past Surgical History:  Procedure Laterality Date  . CARDIOVERSION N/A 03/24/2018   Procedure: CARDIOVERSION;  Surgeon: Antonieta Iba, MD;  Location: [facility] ORS;  Service: Cardiovascular;  Laterality: N/A;  . CARDIOVERSION N/A 08/08/2018   Procedure: CARDIOVERSION;  Surgeon: [name], MD;  Location: [facility] ORS;  Service: Cardiovascular;  Laterality: N/A;  . hearnia repair     X 3- total of two surgeries  . HERNIA REPAIR    . LEG SURGERY     Social History:  reports that he has never smoked. He has never used smokeless tobacco. He reports current alcohol use. He reports that he does not use drugs.  No Known Allergies Family History  Problem Relation Age of Onset  . Heart failure Mother   . Lung cancer Mother   . Lung cancer Father   . Heart attack Maternal Grandmother   . Heart attack Maternal Grandfather    Family history: Family history reviewed and not pertinent  Prior to Admission medications   Medication Sig Start Date End Date Taking? Authorizing Provider  albuterol (VENTOLIN HFA) 108 (90 Base) MCG/ACT inhaler Inhale 2 puffs into the lungs every 4 (four) hours as needed for wheezing or shortness of breath. 10/25/19   Karamalegos, Netta Neat, DO  allopurinol (ZYLOPRIM) 100 MG tablet TAKE 1 TABLET BY MOUTH ONCE DAILY Patient taking  differently: Take 100 mg by mouth daily.  08/15/19   Flinchum, [name], FNP  atorvastatin (LIPITOR) 40 MG tablet TAKE 1 TABLET BY MOUTH ONCE DAILY Patient taking differently: Take 40 mg by mouth daily.  08/15/19   Flinchum, [name], FNP  DULoxetine (CYMBALTA) 20 MG capsule Take 1 capsule (20 mg total) by mouth daily. 10/25/19   Karamalegos, Netta Neat, DO  fluticasone (FLONASE) 50 MCG/ACT nasal spray Place 2 sprays into both nostrils 2 (two) times daily.     [provider]  fluticasone furoate-vilanterol (BREO ELLIPTA) 100-25 MCG/INH AEPB Inhale 1 puff into the lungs daily. 12/29/19   Lurene Shadow, MD  HYDROcodone-acetaminophen (NORCO) 7.5-325 MG tablet Take 1 tablet by mouth every 8 (eight) hours as needed for moderate pain or severe pain. 12/28/19   Lurene Shadow, MD  levothyroxine (SYNTHROID) 50 MCG tablet Take 1 tablet (50 mcg total) by mouth daily before breakfast. 10/25/19   Karamalegos, Netta Neat, DO  lisinopril (ZESTRIL) 2.5 MG tablet Take 1 tablet (2.5 mg total) by mouth daily. 05/08/19   Flinchum, [name], FNP  loperamide (IMODIUM) 2 MG capsule Take 1 capsule (2 mg total) by mouth as needed for diarrhea or loose stools. 06/26/19   Arnetha Courser, MD  metoprolol succinate (TOPROL-XL) 50 MG 24 hr tablet Take 1 tablet (50 mg total) by mouth daily. Take with or immediately following a meal. 06/06/19  End, Cristal Deer, MD  montelukast (SINGULAIR) 10 MG tablet Take 1 tablet (10 mg total) by mouth at bedtime. 10/12/19   Karamalegos, Netta Neat, DO  naphazoline-glycerin (CLEAR EYES REDNESS) 0.012-0.2 % SOLN Place 1-2 drops into both eyes 4 (four) times daily as needed for eye irritation. 08/14/19   Alford Highland, MD  nitroGLYCERIN (NITROSTAT) 0.4 MG SL tablet Place 1 tablet under tongue every 5 minutes as needed for chest pain. (No more than 3 doses within 15 minutes) 03/08/19   Flinchum, [name], FNP  SUMAtriptan (IMITREX) 50 MG tablet Take 1 tablet (50 mg total) by mouth every 2  (two) hours as needed for migraine or headache. May repeat in 2 hours if headache persists or recurs. No more than 2 doses in a day 12/28/19   Lurene Shadow, MD  thiamine 100 MG tablet Take 1 tablet (100 mg total) by mouth daily. 08/14/19   Alford Highland, MD  torsemide (DEMADEX) 20 MG tablet Take 2 tablets (40 mg total) by mouth 2 (two) times daily. 12/28/19   Lurene Shadow, MD  traZODone (DESYREL) 150 MG tablet Take 1 tablet (150 mg total) by mouth at bedtime. 10/25/19   Karamalegos, Netta Neat, DO  warfarin (COUMADIN) 5 MG tablet Take 5 mg by mouth daily.  09/20/19   [provider]   Physical Exam: Vitals:   01/07/20 1910 01/07/20 1937 01/07/20 2000 01/07/20 2100  BP:  (!) 155/85 (!) 147/114 (!) 119/91  Pulse: (!) 125 (!) 150 (!) 139 (!) 130  Resp: 19 (!) 23 (!) 24 18  Temp:  98.2 F (36.8 C) (!) 97.4 F (36.3 C)   TempSrc:  Oral Axillary   SpO2: 99% 93% 97% 97%  Weight:   (!) 197.6 kg   Height:   5\' 8"  (1.727 m)    Constitutional: appears older than chronological age, NAD, calm, comfortable Eyes: PERRL, lids and conjunctivae normal ENMT: Mucous membranes are moist. Posterior pharynx clear of any exudate or lesions. Age-appropriate dentition. Hearing appropriate Neck: normal, supple, no masses, no thyromegaly Respiratory: clear to auscultation bilaterally, no wheezing, no crackles. Normal respiratory effort. No accessory muscle use.  Cardiovascular: Regular rate and rhythm, no murmurs / rubs / gallops. No extremity edema. 2+ pedal pulses. No carotid bruits.  Abdomen: no tenderness, no masses palpated, no hepatosplenomegaly. Bowel sounds positive.  Musculoskeletal: no clubbing / cyanosis. No joint deformity upper and lower extremities. Good ROM, no contractures, no atrophy. Normal muscle tone.  Skin: no rashes, lesions, ulcers. No induration Neurologic: Sensation intact. Strength 5/5 in all 4.  Psychiatric: Normal judgment and insight. Alert and oriented x 3. Normal mood.    EKG: Independently reviewed, showing afib with RVR, rate of 146, QTc 485  Chest x-ray on Admission: Personally reviewed and I agree with radiologist reading as below.  DG Chest Portable 1 View  Result Date: 01/07/2020 CLINICAL DATA:  Respiratory distress, COPD exacerbation, asthma, hypertension EXAM: PORTABLE CHEST 1 VIEW COMPARISON:  Portable exam 1545 hours compared to 12/22/2019 FINDINGS: Enlargement of cardiac silhouette. Mediastinal contours and pulmonary vascularity normal. Lungs grossly clear. No definite infiltrate, pleural effusion, or pneumothorax. Osseous structures unremarkable. IMPRESSION: Enlargement of cardiac silhouette. No acute abnormalities. Electronically Signed   By: 13/07/2019 M.D.   On: 01/07/2020 16:00   Labs on Admission: I have personally reviewed following labs  CBC: Recent Labs  Lab 01/07/20 1536  WBC 8.5  NEUTROABS 5.8  HGB 12.8*  HCT 39.9  MCV 91.5  PLT 282   Basic Metabolic  Panel: Recent Labs  Lab 01/07/20 1536  NA 137  K 4.1  CL 98  CO2 24  GLUCOSE 159*  BUN 12  CREATININE 0.86  CALCIUM 7.8*   GFR: Estimated Creatinine Clearance: 157.1 mL/min (by C-G formula based on SCr of 0.86 mg/dL). Liver Function Tests: Recent Labs  Lab 01/07/20 1536  AST 45*  ALT 22  ALKPHOS 73  BILITOT 0.8  PROT 6.8  ALBUMIN 3.4*   CBG: Recent Labs  Lab 01/07/20 2026  GLUCAP 137*   Urine analysis:    Component Value Date/Time   COLORURINE YELLOW (A) 01/07/2020 1535   APPEARANCEUR CLOUDY (A) 01/07/2020 1535   APPEARANCEUR Hazy (A) 12/25/2018 1442   LABSPEC 1.009 01/07/2020 1535   PHURINE 7.0 01/07/2020 1535   GLUCOSEU NEGATIVE 01/07/2020 1535   HGBUR MODERATE (A) 01/07/2020 1535   BILIRUBINUR NEGATIVE 01/07/2020 1535   BILIRUBINUR Negative 12/25/2018 1442   KETONESUR NEGATIVE 01/07/2020 1535   PROTEINUR NEGATIVE 01/07/2020 1535   NITRITE NEGATIVE 01/07/2020 1535   LEUKOCYTESUR TRACE (A) 01/07/2020 1535   Nalina Yeatman N Ziyan Schoon D.O. [facility]  If 12AM-7AM, please contact overnight-coverage provider If 7AM-7PM, please contact day coverage provider [email or web address]  01/07/2020, 10:12 PM

## 2020-01-07 NOTE — ED Notes (Signed)
Report called to anne all questions answered

## 2020-01-07 NOTE — Sepsis Progress Note (Signed)
Code Sepsis Monitoring Complete. Did not receive the required fluid resuscitation d/t Siadecki Note @ 16:01 "Since septic shock is not suspected, and given the patient's CHF, respiratory distress, and baseline fluid overload, I do not feel it is appropriate to give 30 mL/kg of fluids, which would be 6 L for this patient." Lactic acid trending up, a repeat is ordered.

## 2020-01-07 NOTE — Progress Notes (Signed)
Notified bedside nurse of need to draw and administer fluid bolus, pt needs 5580 cc.

## 2020-01-07 NOTE — ED Notes (Signed)
Call attempt for report unable to give report was told rn was not ready

## 2020-01-07 NOTE — ED Triage Notes (Signed)
Copd flare up asa , nitro and cpap by ems prior to arrival

## 2020-01-07 NOTE — ED Provider Notes (Signed)
[facility] Emergency Department Provider Note ____________________________________________   First MD Initiated Contact with Patient 01/07/20 1532     (approximate)  I have reviewed the triage vital signs and the nursing notes.   HISTORY  Chief Complaint Shortness of Breath (nitro paste 1 inch, asa 324 and cpap given by ems, copd flare up 88% RA per ems )  Level 5 caveat: History present illness limited due to respiratory distress  HPI [name] [name] is a 59 y.o. male with PMH as noted below including CHF and COPD who presents with shortness of breath, acute onset today, associated with substernal chest pain.  The patient states he has been compliant with his medications, but did not take any of them yet today.  EMS reports an O2 saturation of 88% on room air at home.  They placed the patient on CPAP and gave 324 of aspirin and Nitropaste.  Past Medical History:  Diagnosis Date  . Allergy   . Anxiety   . Arthritis   . Asthma   . Brain damage   . Chronic pain of both knees 07/13/2018  . Clotting disorder (HCC)   . COPD (chronic obstructive pulmonary disease) (HCC)   . Depression   . GERD (gastroesophageal reflux disease)   . HFrEF (heart failure with reduced ejection fraction) (HCC)    a. 03/2018 Echo: EF 25-30%, diff HK. Mod LAE.  [name] History of DVT (deep vein thrombosis)   . History of pulmonary embolism    a. Chronic coumadin.  [name] Hypertension   . MI (myocardial infarction) (HCC)   . Morbid obesity (HCC)   . Neck pain 07/13/2018  . NICM (nonischemic cardiomyopathy) (HCC)    a. s/p Cath x 3 - reportedly nl cors. Last cath 2019 in [place]; b. a. 03/2018 Echo: EF 25-30%, diff HK.  [name] Persistent atrial fibrillation (HCC)    a. 03/2018 s/p DCCV; b. CHA2DS2VASc = 1-->Xarelto (later changed to warfarin); c. 05/2018 recurrent afib-->Amio initiated.  . Sleep apnea   . Sleep apnea     Patient Active Problem List   Diagnosis Date Noted  . Community acquired  pneumonia 12/25/2019  . Shortness of breath 12/22/2019  . Atrial fibrillation, chronic (HCC) 12/22/2019  . Class 3 obesity with alveolar hypoventilation, serious comorbidity, and body mass index (BMI) of 60.0 to 69.9 in adult (HCC) 12/22/2019  . GAD (generalized anxiety disorder) 10/12/2019  . Alcohol withdrawal syndrome without complication (HCC)   . Lower extremity ulceration, unspecified laterality, with fat layer exposed (HCC) 08/09/2019  . Alcohol abuse   . Pressure injury of skin 06/26/2019  . Iron deficiency anemia 06/22/2019  . Depression 06/22/2019  . Elevated troponin 06/22/2019  . Left-sided Bell's palsy 05/08/2019  . Chronic intractable headache 05/03/2019  . Head injuries, initial encounter 05/03/2019  . Noncompliance by refusing service 05/03/2019  . Facial droop 05/02/2019  . Pharmacologic therapy 04/11/2019  . Disorder of skeletal system 04/11/2019  . Problems influencing health status 04/11/2019  . Chronic anticoagulation (warfarin  COUMADIN) 04/11/2019  . Elevated sed rate 04/11/2019  . Elevated C-reactive protein (CRP) 04/11/2019  . Elevated hemoglobin A1c 04/11/2019  . Hypoalbuminemia 04/11/2019  . Edema due to hypoalbuminemia 04/11/2019  . Elevated brain natriuretic peptide (BNP) level 04/11/2019  . Chronic hip pain (Right) 04/11/2019  . Osteoarthritis of hip (Right) 04/11/2019  . Degenerative joint disease of right hip 03/05/2019  . Hypothyroidism 03/04/2019  . Chronic venous stasis dermatitis of both lower extremities 03/04/2019  . Long term (  current) use of anticoagulants 02/23/2019  . PE (pulmonary thromboembolism) (HCC) 01/21/2019  . HLD (hyperlipidemia) 01/21/2019  . CAD (coronary artery disease) 01/21/2019  . Cellulitis of right lower extremity 01/21/2019  . Noncompliance 01/09/2019  . Acquired thrombophilia (HCC) 11/28/2018  . Major depressive disorder, recurrent episode, in partial remission (HCC) 09/17/2018  . Chest pain 08/06/2018  . Personal  history of DVT (deep vein thrombosis) 07/13/2018  . History of pulmonary embolism (on Coumadin) 07/13/2018  . Neck pain 07/13/2018  . Chronic low back pain (Bilateral)  w/ sciatica (Bilateral) 07/13/2018  . TBI (traumatic brain injury) (HCC) 07/04/2018  . Abnormal thyroid blood test 06/27/2018  . Type 2 diabetes mellitus with other specified complication (HCC) 06/06/2018  . HTN (hypertension) 06/06/2018  . Chronic gout involving toe, unspecified cause, unspecified laterality 06/06/2018  . Morbid obesity with BMI of 60.0-69.9, adult (HCC) 06/06/2018  . Chronic pain syndrome 06/06/2018  . Ulcers of both lower extremities, limited to breakdown of skin (HCC) 06/06/2018  . Gout 06/06/2018  . Persistent atrial fibrillation (HCC)   . Sepsis (HCC) 03/17/2018  . Centrilobular emphysema (HCC) 02/02/2018  . OSA (obstructive sleep apnea) 02/02/2018  . Lymphedema 02/02/2018  . Acute on chronic respiratory failure with hypoxia (HCC) 01/27/2018    Past Surgical History:  Procedure Laterality Date  . CARDIOVERSION N/A 03/24/2018   Procedure: CARDIOVERSION;  Surgeon: Antonieta Iba, MD;  Location: [facility] ORS;  Service: Cardiovascular;  Laterality: N/A;  . CARDIOVERSION N/A 08/08/2018   Procedure: CARDIOVERSION;  Surgeon: [name], MD;  Location: [facility] ORS;  Service: Cardiovascular;  Laterality: N/A;  . hearnia repair     X 3- total of two surgeries  . HERNIA REPAIR    . LEG SURGERY      Prior to Admission medications   Medication Sig Start Date End Date Taking? Authorizing Provider  albuterol (VENTOLIN HFA) 108 (90 Base) MCG/ACT inhaler Inhale 2 puffs into the lungs every 4 (four) hours as needed for wheezing or shortness of breath. 10/25/19   Karamalegos, Netta Neat, DO  allopurinol (ZYLOPRIM) 100 MG tablet TAKE 1 TABLET BY MOUTH ONCE DAILY Patient taking differently: Take 100 mg by mouth daily.  08/15/19   Flinchum, [name], FNP  atorvastatin (LIPITOR) 40 MG tablet TAKE 1 TABLET BY MOUTH  ONCE DAILY Patient taking differently: Take 40 mg by mouth daily.  08/15/19   Flinchum, [name], FNP  DULoxetine (CYMBALTA) 20 MG capsule Take 1 capsule (20 mg total) by mouth daily. 10/25/19   Karamalegos, Netta Neat, DO  fluticasone (FLONASE) 50 MCG/ACT nasal spray Place 2 sprays into both nostrils 2 (two) times daily.     [provider]  fluticasone furoate-vilanterol (BREO ELLIPTA) 100-25 MCG/INH AEPB Inhale 1 puff into the lungs daily. 12/29/19   Lurene Shadow, MD  HYDROcodone-acetaminophen (NORCO) 7.5-325 MG tablet Take 1 tablet by mouth every 8 (eight) hours as needed for moderate pain or severe pain. 12/28/19   Lurene Shadow, MD  levothyroxine (SYNTHROID) 50 MCG tablet Take 1 tablet (50 mcg total) by mouth daily before breakfast. 10/25/19   Karamalegos, Netta Neat, DO  lisinopril (ZESTRIL) 2.5 MG tablet Take 1 tablet (2.5 mg total) by mouth daily. 05/08/19   Flinchum, [name], FNP  loperamide (IMODIUM) 2 MG capsule Take 1 capsule (2 mg total) by mouth as needed for diarrhea or loose stools. 06/26/19   Arnetha Courser, MD  metoprolol succinate (TOPROL-XL) 50 MG 24 hr tablet Take 1 tablet (50 mg total) by mouth daily. Take  with or immediately following a meal. 06/06/19   End, Cristal Deer, MD  montelukast (SINGULAIR) 10 MG tablet Take 1 tablet (10 mg total) by mouth at bedtime. 10/12/19   Karamalegos, Netta Neat, DO  naphazoline-glycerin (CLEAR EYES REDNESS) 0.012-0.2 % SOLN Place 1-2 drops into both eyes 4 (four) times daily as needed for eye irritation. 08/14/19   Alford Highland, MD  nitroGLYCERIN (NITROSTAT) 0.4 MG SL tablet Place 1 tablet under tongue every 5 minutes as needed for chest pain. (No more than 3 doses within 15 minutes) 03/08/19   Flinchum, [name], FNP  SUMAtriptan (IMITREX) 50 MG tablet Take 1 tablet (50 mg total) by mouth every 2 (two) hours as needed for migraine or headache. May repeat in 2 hours if headache persists or recurs. No more than 2 doses in a day  12/28/19   Lurene Shadow, MD  thiamine 100 MG tablet Take 1 tablet (100 mg total) by mouth daily. 08/14/19   Alford Highland, MD  torsemide (DEMADEX) 20 MG tablet Take 2 tablets (40 mg total) by mouth 2 (two) times daily. 12/28/19   Lurene Shadow, MD  traZODone (DESYREL) 150 MG tablet Take 1 tablet (150 mg total) by mouth at bedtime. 10/25/19   Karamalegos, Netta Neat, DO  warfarin (COUMADIN) 5 MG tablet Take 5 mg by mouth daily.  09/20/19   [provider]    Allergies Patient has no known allergies.  Family History  Problem Relation Age of Onset  . Heart failure Mother   . Lung cancer Mother   . Lung cancer Father   . Heart attack Maternal Grandmother   . Heart attack Maternal Grandfather     Social History Social History   Tobacco Use  . Smoking status: Never Smoker  . Smokeless tobacco: Never Used  Vaping Use  . Vaping Use: Never used  Substance Use Topics  . Alcohol use: Yes  . Drug use: Never    Review of Systems Level 5 caveat: Unable to obtain review of systems due to respiratory distress    ____________________________________________   PHYSICAL EXAM:  VITAL SIGNS: ED Triage Vitals  Enc Vitals Group     BP 01/07/20 1534 (!) 170/94     Pulse Rate 01/07/20 1534 (!) 120     Resp 01/07/20 1534 20     Temp 01/07/20 1534 99.4 F (37.4 C)     Temp Source 01/07/20 1534 Oral     SpO2 01/07/20 1533 95 %     Weight --      Height --      Head Circumference --      Peak Flow --      Pain Score 01/07/20 1534 0     Pain Loc --      Pain Edu? --      Excl. in GC? --     Constitutional: Alert and oriented.  Uncomfortable appearing. Eyes: Conjunctivae are normal.  Head: Atraumatic. Nose: No congestion/rhinnorhea. Mouth/Throat: Mucous membranes are moist.   Neck: Normal range of motion.  Cardiovascular: Tachycardic, irregular rhythm. Grossly normal heart sounds.  Good peripheral circulation. Respiratory: Increased respiratory effort with accessory  muscle use.  Rhonchi and some rales bilaterally. Gastrointestinal: Soft and nontender. No distention.  Genitourinary: No flank tenderness. Musculoskeletal: 3+ bilateral lower extremity edema and chronic venous stasis changes.  Extremities warm and well perfused.  Neurologic:  Normal speech and language. No gross focal neurologic deficits are appreciated.  Skin:  Skin is warm and dry. No rash noted.  Psychiatric: Mood and affect are normal. Speech and behavior are normal.  ____________________________________________   LABS (all labs ordered are listed, but only abnormal results are displayed)  Labs Reviewed  CBC WITH DIFFERENTIAL/PLATELET - Abnormal; Notable for the following components:      Result Value   Hemoglobin 12.8 (*)    RDW 15.8 (*)    All other components within normal limits  RESP PANEL BY RT-PCR (FLU A&B, COVID) ARPGX2  COMPREHENSIVE METABOLIC PANEL  BRAIN NATRIURETIC PEPTIDE  LACTIC ACID, PLASMA  LACTIC ACID, PLASMA  URINALYSIS, COMPLETE (UACMP) WITH MICROSCOPIC  TROPONIN I (HIGH SENSITIVITY)   ____________________________________________  EKG  ED ECG REPORT I, Dionne Bucy, the attending physician, personally viewed and interpreted this ECG.  Date: 01/07/2020 EKG Time: 1535 Rate: 146 Rhythm: atrial fibrillation with RVR QRS Axis: Nonspecific IVCD Intervals: normal ST/T Wave abnormalities: Nonspecific abnormalities Narrative Interpretation: Atrial fibrillation with RVR; no evidence of acute ischemia  ____________________________________________  RADIOLOGY  CXR interpreted by me shows no focal infiltrate or significant edema  ____________________________________________   PROCEDURES  Procedure(s) performed: No  Procedures  Critical Care performed: Yes  CRITICAL CARE Performed by: Dionne Bucy   Total critical care time: 35 minutes  Critical care time was exclusive of separately billable procedures and treating other  patients.  Critical care was necessary to treat or prevent imminent or life-threatening deterioration.  Critical care was time spent personally by me on the following activities: development of treatment plan with patient and/or surrogate as well as nursing, discussions with consultants, evaluation of patient's response to treatment, examination of patient, obtaining history from patient or surrogate, ordering and performing treatments and interventions, ordering and review of laboratory studies, ordering and review of radiographic studies, pulse oximetry and re-evaluation of patient's condition.  ____________________________________________   INITIAL IMPRESSION / ASSESSMENT AND PLAN / ED COURSE  Pertinent labs & imaging results that were available during my care of the patient were reviewed by me and considered in my medical decision making (see chart for details).  59 year old male with PMH as noted above including CHF, COPD, obesity, OSA presents with acute onset of shortness of breath today associated with chest pain and some cough.  Per EMS, O2 saturation was 88% on room air at home.  He was placed on CPAP and given aspirin and nitro.  I reviewed the past medical records in Epic.  The patient was admitted earlier this month and discharged on 11/12 after presenting with cough and fever.  He was diagnosed with a COPD exacerbation and sepsis due to lower lobe pneumonia.  He required BiPAP at that time.  On exam currently, the patient is uncomfortable appearing with increased work of breathing and tachypnea.  He is tachycardic in rapid atrial fibrillation with a borderline elevated temperature.  Blood pressure has improved after the nitro.  He has rhonchi and a few scattered rales bilaterally.  There is chronic bilateral lower extremity edema and venous stasis changes.  The patient has some lower extremity wounds that are currently dressed.  There is no surrounding erythema or  induration.  Differential includes recurrent pneumonia, CHF exacerbation, COPD exacerbation, or some combination thereof.  I have a lower suspicion for COVID-19.  The patient has been placed on BiPAP with good response.  We will obtain a chest x-ray, lab work-up, give IV Lasix, and reassess.  ----------------------------------------- 7:49 PM on 01/07/2020 -----------------------------------------  Chest x-ray shows no focal infiltrate.  Lab work-up is concerning for elevated lactate, normal WBC count, with only  slightly elevated BNP and troponin.  The tachycardia, elevated temperature, and elevated lactate are concerning for sepsis.  I have a lower suspicion for acute CHF exacerbation.  Although the lactate is over 4, this is at least partially due to the patient's acute respiratory distress.  I overall do not suspect septic shock.  I ordered antibiotics per the sepsis protocol as well as a fluid bolus.  Since septic shock is not suspected, and given the patient's CHF, respiratory distress, and baseline fluid overload, I do not feel it is appropriate to give 30 mL/kg of fluids, which would be 6 L for this patient.  On clinical reassessment, the patient remains tachycardic with otherwise stable vital signs.  He appears much more comfortable and has been able to tolerate being off of BiPAP for short periods to drink water.  The patient will require admission.  I discussed the case with Dr. Sedalia Muta from the hospitalist service.  ______________________________  [name] was evaluated in Emergency Department on 01/07/2020 for the symptoms described in the history of present illness. He was evaluated in the context of the global COVID-19 pandemic, which necessitated consideration that the patient might be at risk for infection with the SARS-CoV-2 virus that causes COVID-19. Institutional protocols and algorithms that pertain to the evaluation of patients at risk for COVID-19 are in a state of rapid  change based on information released by regulatory bodies including the CDC and federal and state organizations. These policies and algorithms were followed during the patient's care in the ED.  ____________________________________________   FINAL CLINICAL IMPRESSION(S) / ED DIAGNOSES  Final diagnoses:  None      NEW MEDICATIONS STARTED DURING THIS VISIT:  New Prescriptions   No medications on file     Note:  This document was prepared using Dragon voice recognition software and may include unintentional dictation errors.    Dionne Bucy, MD 01/07/20 [phone]

## 2020-01-07 NOTE — Progress Notes (Signed)
Notified provider of need to order and draw repeat lactic acid at 1930 (RN notified as well).

## 2020-01-07 NOTE — Progress Notes (Signed)
Notified bedside nurse of need to draw blood cultures.  

## 2020-01-07 NOTE — Consult Note (Addendum)
ANTICOAGULATION CONSULT NOTE  Pharmacy Consult for Heparin Infusion Indication: chest pain/ACS / Afib / VTE prophylaxis  Patient Measurements: Height: 5\' 8"  (172.7 cm) Weight: (!) 197.6 kg (435 lb 10.1 oz) IBW/kg (Calculated) : 68.4 Heparin Dosing Weight: 119 kg  Labs: Recent Labs    01/07/20 1536 01/07/20 1955 01/07/20 2147  HGB 12.8*  --   --   HCT 39.9  --   --   PLT 282  --   --   LABPROT  --   --  14.4  INR  --   --  1.2  CREATININE 0.86  --   --   TROPONINIHS 77* 90*  --     Estimated Creatinine Clearance: 157.1 mL/min (by C-G formula based on SCr of 0.86 mg/dL).   Medical History: Past Medical History:  Diagnosis Date  . Allergy   . Anxiety   . Arthritis   . Asthma   . Brain damage   . Chronic pain of both knees 07/13/2018  . Clotting disorder (HCC)   . COPD (chronic obstructive pulmonary disease) (HCC)   . Depression   . GERD (gastroesophageal reflux disease)   . HFrEF (heart failure with reduced ejection fraction) (HCC)    a. 03/2018 Echo: EF 25-30%, diff HK. Mod LAE.  04/2018 History of DVT (deep vein thrombosis)   . History of pulmonary embolism    a. Chronic coumadin.  [name] Hypertension   . MI (myocardial infarction) (HCC)   . Morbid obesity (HCC)   . Neck pain 07/13/2018  . NICM (nonischemic cardiomyopathy) (HCC)    a. s/p Cath x 3 - reportedly nl cors. Last cath 2019 in [place]; b. a. 03/2018 Echo: EF 25-30%, diff HK.  04/2018 Persistent atrial fibrillation (HCC)    a. 03/2018 s/p DCCV; b. CHA2DS2VASc = 1-->Xarelto (later changed to warfarin); c. 05/2018 recurrent afib-->Amio initiated.  . Sleep apnea   . Sleep apnea     Medications:  Warfarin 5 mg daily prior to admission  Per RN, patient is un-sure of how he takes warfarin at home but does report compliance  Assessment: Patient is a 59 y/o M with medical history as above who presented to the ED 11/22 with shortness of breath. Troponin 77 >> 90. Pharmacy has been consulted to initiate heparin infusion for ACS.  There is also concern for left lower leg DVT and ultrasound has been ordered.  Baseline INR 1.2, aPTT pending. Baseline CBC with Hgb 12.8, platelets 282.   Goal of Therapy:  Heparin level 0.3-0.7 units/ml Monitor platelets by anticoagulation protocol: Yes   Plan:  --Heparin 4000 unit IV bolus x 1 followed by continuous infusion at 1600 units/hr --Heparin level 6 hours after initiation of infusion --Daily CBC per protocol --Continue to follow clinical course and re-initiation of warfarin when appropriate  12/22 01/07/2020,10:53 PM

## 2020-01-07 NOTE — Progress Notes (Signed)
CODE SEPSIS - PHARMACY COMMUNICATION  **Broad Spectrum Antibiotics should be administered within 1 hour of Sepsis diagnosis**  Time Code Sepsis Called/Page Received: 1650  Antibiotics Ordered: Cefepime and Vanc  Time of 1st antibiotic administration: 1712  Otelia Sergeant, PharmD, [facility] 01/07/2020 5:25 PM

## 2020-01-08 ENCOUNTER — Inpatient Hospital Stay: Payer: Medicaid Other

## 2020-01-08 DIAGNOSIS — I5023 Acute on chronic systolic (congestive) heart failure: Secondary | ICD-10-CM | POA: Diagnosis not present

## 2020-01-08 DIAGNOSIS — I4891 Unspecified atrial fibrillation: Secondary | ICD-10-CM | POA: Diagnosis not present

## 2020-01-08 DIAGNOSIS — R0602 Shortness of breath: Secondary | ICD-10-CM | POA: Diagnosis not present

## 2020-01-08 DIAGNOSIS — L03115 Cellulitis of right lower limb: Secondary | ICD-10-CM | POA: Diagnosis not present

## 2020-01-08 DIAGNOSIS — R6521 Severe sepsis with septic shock: Secondary | ICD-10-CM | POA: Diagnosis not present

## 2020-01-08 DIAGNOSIS — A419 Sepsis, unspecified organism: Secondary | ICD-10-CM | POA: Diagnosis not present

## 2020-01-08 DIAGNOSIS — I482 Chronic atrial fibrillation, unspecified: Secondary | ICD-10-CM | POA: Diagnosis not present

## 2020-01-08 DIAGNOSIS — Z136 Encounter for screening for cardiovascular disorders: Secondary | ICD-10-CM | POA: Diagnosis not present

## 2020-01-08 DIAGNOSIS — K760 Fatty (change of) liver, not elsewhere classified: Secondary | ICD-10-CM | POA: Diagnosis not present

## 2020-01-08 DIAGNOSIS — J9621 Acute and chronic respiratory failure with hypoxia: Secondary | ICD-10-CM | POA: Diagnosis not present

## 2020-01-08 LAB — CBC
HCT: 35 % — ABNORMAL LOW (ref 39.0–52.0)
Hemoglobin: 11.5 g/dL — ABNORMAL LOW (ref 13.0–17.0)
MCH: 29.6 pg (ref 26.0–34.0)
MCHC: 32.9 g/dL (ref 30.0–36.0)
MCV: 90.2 fL (ref 80.0–100.0)
Platelets: 146 10*3/uL — ABNORMAL LOW (ref 150–400)
RBC: 3.88 MIL/uL — ABNORMAL LOW (ref 4.22–5.81)
RDW: 15.7 % — ABNORMAL HIGH (ref 11.5–15.5)
WBC: 6.8 10*3/uL (ref 4.0–10.5)
nRBC: 0 % (ref 0.0–0.2)

## 2020-01-08 LAB — APTT: aPTT: 144 seconds — ABNORMAL HIGH (ref 24–36)

## 2020-01-08 LAB — CORTISOL-AM, BLOOD: Cortisol - AM: 14.3 ug/dL (ref 6.7–22.6)

## 2020-01-08 LAB — PROTIME-INR
INR: 1.3 — ABNORMAL HIGH (ref 0.8–1.2)
Prothrombin Time: 15.9 seconds — ABNORMAL HIGH (ref 11.4–15.2)

## 2020-01-08 LAB — LACTIC ACID, PLASMA
Lactic Acid, Venous: 4.5 mmol/L (ref 0.5–1.9)
Lactic Acid, Venous: 1.5 mmol/L (ref 0.5–1.9)

## 2020-01-08 LAB — HEPARIN LEVEL (UNFRACTIONATED)
Heparin Unfractionated: <0.1 IU/mL — ABNORMAL LOW (ref 0.30–0.70)
Heparin Unfractionated: 0.4 IU/mL (ref 0.30–0.70)

## 2020-01-08 LAB — PROCALCITONIN: Procalcitonin: <0.1 ng/mL

## 2020-01-08 MED ORDER — DIPHENHYDRAMINE HCL 50 MG/ML IJ SOLN
25.0000 mg | Freq: Every evening | INTRAMUSCULAR | Status: DC | PRN
  Administered 2020-01-08: 25 mg via INTRAVENOUS

## 2020-01-08 MED ORDER — PROCHLORPERAZINE EDISYLATE 10 MG/2ML IJ SOLN
10.0000 mg | Freq: Four times a day (QID) | INTRAMUSCULAR | Status: DC | PRN
  Administered 2020-01-08 (×3): 10 mg via INTRAVENOUS
  Filled 2020-01-08 (×5): qty 2

## 2020-01-08 MED ORDER — VANCOMYCIN HCL 1500 MG/300ML IV SOLN
1500.0000 mg | Freq: Two times a day (BID) | INTRAVENOUS | Status: DC
  Administered 2020-01-08 – 2020-01-09 (×3): 1500 mg via INTRAVENOUS
  Filled 2020-01-08 (×4): qty 300

## 2020-01-08 MED ORDER — HEPARIN BOLUS VIA INFUSION
3500.0000 [IU] | Freq: Once | INTRAVENOUS | Status: AC
  Administered 2020-01-08: 3500 [IU] via INTRAVENOUS
  Filled 2020-01-08: qty 3500

## 2020-01-08 MED ORDER — ALBUTEROL SULFATE (2.5 MG/3ML) 0.083% IN NEBU
2.5000 mg | INHALATION_SOLUTION | RESPIRATORY_TRACT | Status: DC | PRN
  Administered 2020-01-08 (×2): 2.5 mg via RESPIRATORY_TRACT
  Filled 2020-01-08: qty 3

## 2020-01-08 MED ORDER — LORAZEPAM 0.5 MG PO TABS
0.5000 mg | ORAL_TABLET | Freq: Four times a day (QID) | ORAL | Status: DC | PRN
  Administered 2020-01-08 – 2020-01-09 (×3): 0.5 mg via ORAL
  Filled 2020-01-08 (×3): qty 1

## 2020-01-08 NOTE — Progress Notes (Signed)
Discussed with NP regarding pt's HR. Not to treat pt's heart rate unless it is sustaining at 150's.

## 2020-01-08 NOTE — Progress Notes (Signed)
PROGRESS NOTE    [name] [name]  EXH:[id number] DOB: 1960-03-30 DOA: 01/07/2020 PCP: [name], DO   Chief complaint.  Shortness of breath. Brief Narrative:  [name] [name] is a 59 y.o. 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 Patient has been complaining of a cough and shortness of breath about a week time.  He also has some abdominal pain, which he is intermittent, associate with nausea, no vomiting.  He did not have any diarrhea or constipation. His chest x-ray did not show any acute changes.  Procalcitonin level less than 0.01.  He had a tachycardia, tachypnea, increased oxygen requirement, severe lactic acidosis of 6.2. He also has left lower extremity redness, concern for acute cellulitis.  Started on vancomycin and Rocephin.  He also received fluids overnight.    Assessment & Plan:   Principal Problem:   Sepsis ([place]) Active Problems:   Acute on chronic respiratory failure with hypoxia (HCC)   Chronic pain syndrome   Chest pain   CAD (coronary artery disease)   Cellulitis of right lower extremity   Hypothyroidism   Chronic anticoagulation (warfarin  COUMADIN)   Depression   GAD (generalized anxiety disorder)   Atrial fibrillation, chronic (HCC)   Class 3 obesity with alveolar hypoventilation, serious comorbidity, and body mass index (BMI) of 60.0 to 69.9 in adult ([place])  #1.  Septic shock. POA.  Secondary to left lower extremity cellulitis. Patient met septic shock criteria with tachycardia, tachypnea, acute on chronic hypoxemic respiratory failure, severe lactic acidosis with a lactic acid level  6.2. Patient has some respiratory symptoms, however, chest x-ray did not show pneumonia, procalcitonin level not elevated.  Sepsis is not from pulmonary source. Patient also has right upper quadrant tenderness.  Obtain ultrasound to rule out cholecystitis.  Bilirubin is normal, no evidence of bile duct obstruction. I will continue some IV fluids.  Patient BNP 141, not significant elevated. Cultures are pending. UA had a trace amount of leukocytes, negative nitrates.  Likes likely the source of sepsis.  #2.  Left lower extremity cellulitis. Patient was already on warfarin, duplex ultrasound did not show DVT. Continue vancomycin.  Rocephin.  #3.  Acute on chronic respiratory failure.  Secondary to sepsis. Chronic obstructive sleep apnea. Patient is still on BiPAP, spoke with the nurse, will try to wean off.  Will continue BiPAP while asleep at night.  #4.  Atrial fibrillation with rapid ventricular response. Elevated troponin. Elevated troponin probably due to atrial fibrillation and sepsis. Cardiology will see the patient today. Continue anticoagulation. On heparin. We will change to warfarin if no intervention per cardiology.  #5.  Morbid obesity. Follow.  6.  Essential hypertension. Continue lisinopril and metoprolol.      DVT prophylaxis: Heparin drip Code Status: Full Family Communication: Patient states that he does not have a family member he wants me to call. Disposition Plan:  .   Status is: Inpatient  Remains inpatient appropriate because:Inpatient level of care appropriate due to severity of illness  Patient condition still severe, he will be in stepdown unit for another day.   Dispo: The patient is from: Home              Anticipated d/c is to: Home  Anticipated d/c date is: > 3 days              Patient currently is not medically stable to d/c.        I/O last 3 completed shifts: In: 1964.9 [I.V.:549.6; IV Piggyback:1415.4] Out: 175  [Urine:175] No intake/output data recorded.     Consultants:   Cardiology.  Procedures: None  Antimicrobials:  Vancomycin and Rocephin.  Subjective: Patient was on BiPAP last night, he feels more comfortable today.  Cough, with white mucus. Denies any fever or chills. He still has some intermittent abdominal discomfort.  Denies any nausea vomiting today.  No diarrhea constipation. No chest pain today, no palpitation. No dizziness or headaches.  Objective: Vitals:   01/08/20 0400 01/08/20 0500 01/08/20 0600 01/08/20 0800  BP: 125/69  (!) 128/103   Pulse: (!) 133 (!) 148 79   Resp: 10 17 17    Temp:    97.9 F (36.6 C)  TempSrc:    Axillary  SpO2: 97% 94% 95%   Weight:      Height:        Intake/Output Summary (Last 24 hours) at 01/08/2020 0848 Last data filed at 01/08/2020 0200 Gross per 24 hour  Intake 1964.93 ml  Output 175 ml  Net 1789.93 ml   Filed Weights   01/07/20 2000  Weight: (!) 197.6 kg    Examination:  General exam: Appears calm and comfortable, morbid obese Respiratory system: Decreased breathing sounds. Respiratory effort normal. Cardiovascular system: Tachycardic irregular irregular.  No JVD, murmurs, rubs, gallops or clicks. Gastrointestinal system: Abdomen is nondistended, soft and nontender. No organomegaly or masses felt. Normal bowel sounds heard. Central nervous system: Alert and oriented. No focal neurological deficits. Extremities: Bilateral lower extremity chronic changes, left distal lower extremity red, swelling, tender to touch. Skin: No rashes, lesions or ulcers Psychiatry:  Mood & affect appropriate.     Data Reviewed: I have personally reviewed following labs and imaging studies  CBC: Recent Labs  Lab 01/07/20 1536 01/08/20 0654  WBC 8.5 6.8  NEUTROABS 5.8  --   HGB 12.8* 11.5*  HCT 39.9 35.0*  MCV 91.5 90.2  PLT 282 588*   Basic Metabolic Panel: Recent Labs  Lab 01/07/20 1536  NA 137  K 4.1  CL 98  CO2 24   GLUCOSE 159*  BUN 12  CREATININE 0.86  CALCIUM 7.8*   GFR: Estimated Creatinine Clearance: 157.1 mL/min (by C-G formula based on SCr of 0.86 mg/dL). Liver Function Tests: Recent Labs  Lab 01/07/20 1536  AST 45*  ALT 22  ALKPHOS 73  BILITOT 0.8  PROT 6.8  ALBUMIN 3.4*   No results for input(s): LIPASE, AMYLASE in the last 168 hours. No results for input(s): AMMONIA in the last 168 hours. Coagulation Profile: Recent Labs  Lab 01/07/20 2147 01/07/20 2332  INR 1.2 1.3*   Cardiac Enzymes: No results for input(s): CKTOTAL, CKMB, CKMBINDEX, TROPONINI in the last 168 hours. BNP (last 3 results) No results for input(s): PROBNP in the last 8760 hours. HbA1C: No results for input(s): HGBA1C in the last 72 hours. CBG: Recent Labs  Lab 01/07/20 2026  GLUCAP 137*   Lipid Profile: No results for input(s): CHOL, HDL, LDLCALC, TRIG, CHOLHDL, LDLDIRECT in the last 72 hours. Thyroid Function Tests: No results for input(s): TSH, T4TOTAL, FREET4, T3FREE, THYROIDAB in the last 72 hours. Anemia Panel: No results for input(s): VITAMINB12, FOLATE, FERRITIN, TIBC, IRON, RETICCTPCT in the last 72 hours. Sepsis Labs: Recent Labs  Lab  01/07/20 1535 01/07/20 1733 01/07/20 2147 01/08/20 0252 01/08/20 0654  PROCALCITON  --   --   --   --  <0.10  LATICACIDVEN 5.7* 6.2* 5.3* 4.5*  --     Recent Results (from the past 240 hour(s))  Resp Panel by RT-PCR (Flu A&B, Covid) Nasopharyngeal Swab     Status: None   Collection Time: 01/07/20  3:36 PM   Specimen: Nasopharyngeal Swab; Nasopharyngeal(NP) swabs in vial transport medium  Result Value Ref Range Status   SARS Coronavirus 2 by RT PCR NEGATIVE NEGATIVE Final    Comment: (NOTE) SARS-CoV-2 target nucleic acids are NOT DETECTED.  The SARS-CoV-2 RNA is generally detectable in upper respiratory specimens during the acute phase of infection. The lowest concentration of SARS-CoV-2 viral copies this assay can detect is 138 copies/mL. A  negative result does not preclude SARS-Cov-2 infection and should not be used as the sole basis for treatment or other patient management decisions. A negative result may occur with  improper specimen collection/handling, submission of specimen other than nasopharyngeal swab, presence of viral mutation(s) within the areas targeted by this assay, and inadequate number of viral copies(<138 copies/mL). A negative result must be combined with clinical observations, patient history, and epidemiological information. The expected result is Negative.  Fact Sheet for Patients:  EntrepreneurPulse.com.au  Fact Sheet for Healthcare Providers:  IncredibleEmployment.be  This test is no t yet approved or cleared by the [place] FDA and  has been authorized for detection and/or diagnosis of SARS-CoV-2 by FDA under an Emergency Use Authorization (EUA). This EUA will remain  in effect (meaning this test can be used) for the duration of the COVID-19 declaration under Section 564(b)(1) of the Act, 21 U.S.C.section 360bbb-3(b)(1), unless the authorization is terminated  or revoked sooner.       Influenza A by PCR NEGATIVE NEGATIVE Final   Influenza B by PCR NEGATIVE NEGATIVE Final    Comment: (NOTE) The Xpert Xpress SARS-CoV-2/FLU/RSV plus assay is intended as an aid in the diagnosis of influenza from Nasopharyngeal swab specimens and should not be used as a sole basis for treatment. Nasal washings and aspirates are unacceptable for Xpert Xpress SARS-CoV-2/FLU/RSV testing.  Fact Sheet for Patients: EntrepreneurPulse.com.au  Fact Sheet for Healthcare Providers: IncredibleEmployment.be  This test is not yet approved or cleared by the [place] FDA and has been authorized for detection and/or diagnosis of SARS-CoV-2 by FDA under an Emergency Use Authorization (EUA). This EUA will remain in effect (meaning this test can  be used) for the duration of the COVID-19 declaration under Section 564(b)(1) of the Act, 21 U.S.C. section 360bbb-3(b)(1), unless the authorization is terminated or revoked.  Performed at [facility], [address] [place], [place] [address]   Culture, blood (single)     Status: None (Preliminary result)   Collection Time: 01/07/20  4:50 PM   Specimen: BLOOD  Result Value Ref Range Status   Specimen Description BLOOD RIGHT ANTECUBITAL  Final   Special Requests   Final    BOTTLES DRAWN AEROBIC AND ANAEROBIC Blood Culture adequate volume   Culture   Final    NO GROWTH < 24 HOURS Performed at [facility], [address], [place], [place] [address]    Report Status PENDING  Incomplete  MRSA PCR Screening     Status: None   Collection Time: 01/07/20  7:45 PM   Specimen: Nasopharyngeal  Result Value Ref Range Status   MRSA by PCR NEGATIVE NEGATIVE Final  Comment:        The GeneXpert MRSA Assay (FDA approved for NASAL specimens only), is one component of a comprehensive MRSA colonization surveillance program. It is not intended to diagnose MRSA infection nor to guide or monitor treatment for MRSA infections. Performed at [facility], [address] [place], [place] [address]          Radiology Studies: US Venous Img Lower Bilateral (DVT)  Result Date: 01/08/2020 CLINICAL DATA:  Bilateral lower extremity pain and edema. Evaluate for DVT. EXAM: BILATERAL LOWER EXTREMITY VENOUS DOPPLER ULTRASOUND TECHNIQUE: Gray-scale sonography with graded compression, as well as color Doppler and duplex ultrasound were performed to evaluate the lower extremity deep venous systems from the level of the common femoral vein and including the common femoral, femoral, profunda femoral, popliteal and calf veins including the posterior tibial, peroneal and gastrocnemius veins when visible. The superficial great saphenous vein was also interrogated. Spectral  Doppler was utilized to evaluate flow at rest and with distal augmentation maneuvers in the common femoral, femoral and popliteal veins. COMPARISON:  Bilateral lower extremity venous Doppler ultrasound-12/22/2019; 01/21/2019; 10/24/2018 (all negative); left lower extremity venous Doppler ultrasound-11/07/2019 (negative FINDINGS: Examination is degraded due to patient body habitus and poor sonographic window RIGHT LOWER EXTREMITY Common Femoral Vein: No evidence of thrombus. Normal compressibility, respiratory phasicity and response to augmentation. Saphenofemoral Junction: No evidence of thrombus. Normal compressibility and flow on color Doppler imaging. Profunda Femoral Vein: No evidence of thrombus. Normal compressibility and flow on color Doppler imaging. Femoral Vein: No evidence of thrombus. Normal compressibility, respiratory phasicity and response to augmentation. Popliteal Vein: No evidence of thrombus. Normal compressibility, respiratory phasicity and response to augmentation. Calf Veins: Appear patent where visualized. Superficial Great Saphenous Vein: No evidence of thrombus. Normal compressibility. Venous Reflux:  None. Other Findings:  None. LEFT LOWER EXTREMITY Common Femoral Vein: No evidence of thrombus. Normal compressibility, respiratory phasicity and response to augmentation. Saphenofemoral Junction: No evidence of thrombus. Normal compressibility and flow on color Doppler imaging. Profunda Femoral Vein: No evidence of thrombus. Normal compressibility and flow on color Doppler imaging. Femoral Vein: No evidence of thrombus. Normal compressibility, respiratory phasicity and response to augmentation. Popliteal Vein: No evidence of thrombus. Normal compressibility, respiratory phasicity and response to augmentation. Calf Veins: Appear patent where visualized. Superficial Great Saphenous Vein: No evidence of thrombus. Normal compressibility. Venous Reflux:  None. Other Findings:  None. IMPRESSION: No  evidence of DVT within either lower extremity. Note, this is the 5th negative lower extremity venous Doppler ultrasound since 10/24/2018. Electronically Signed   By: Sandi Mariscal M.D.   On: 01/08/2020 07:49   DG Chest Portable 1 View  Result Date: 01/07/2020 CLINICAL DATA:  Respiratory distress, COPD exacerbation, asthma, hypertension EXAM: PORTABLE CHEST 1 VIEW COMPARISON:  Portable exam 1545 hours compared to 12/22/2019 FINDINGS: Enlargement of cardiac silhouette. Mediastinal contours and pulmonary vascularity normal. Lungs grossly clear. No definite infiltrate, pleural effusion, or pneumothorax. Osseous structures unremarkable. IMPRESSION: Enlargement of cardiac silhouette. No acute abnormalities. Electronically Signed   By: Lavonia Dana M.D.   On: 01/07/2020 16:00        Scheduled Meds: . allopurinol  100 mg Oral Daily  . atorvastatin  40 mg Oral Daily  . chlorhexidine  15 mL Mouth Rinse BID  . Chlorhexidine Gluconate Cloth  6 each Topical Daily  . DULoxetine  20 mg Oral Daily  . fluticasone furoate-vilanterol  1 puff Inhalation Daily  . levothyroxine  50 mcg Oral QAC breakfast  .  lisinopril  2.5 mg Oral Daily  . mouth rinse  15 mL Mouth Rinse q12n4p  . metoprolol succinate  50 mg Oral Daily  . montelukast  10 mg Oral QHS  . pantoprazole (PROTONIX) IV  40 mg Intravenous q1800  . thiamine  100 mg Oral Daily  . traZODone  150 mg Oral QHS   Continuous Infusions: . sodium chloride 125 mL/hr at 01/08/20 0504  . cefTRIAXone (ROCEPHIN)  IV Stopped (01/07/20 2248)  . heparin 1,600 Units/hr (01/07/20 2319)  . vancomycin       LOS: 1 day    Time spent: 36 minutes    Sharen Hones, MD [facility]   To contact the attending provider between 7A-7P or the covering provider during after hours 7P-7A, please log into the web site [email or web address] and access using universal [place] password for that web site. If you do not have the password, please call the hospital  operator.  01/08/2020, 8:48 AM

## 2020-01-08 NOTE — Consult Note (Signed)
Pharmacy Antibiotic Note  [name] [name] is a 59 y.o. male admitted on 01/07/2020 with pneumonia.  Pharmacy has been consulted for vancomycin dosing.  Plan: Patient load was started in the ED 11/22 PM with goal of 2.5g load, however only recieved 1g and orders were discontinued. For the continuation, Vancomycin maintenance dosing will start at 1500mg  q12H based on patient's current weight, indication, and renal function. This was started in the ED with cefepime and transitioned after first dose to Ceftriaxone for the continuation.  Will continue to monitor renal function and given vancomycin q12h dosing interval will assess prior to 4th dose on 11/24 PM.  Height: 5\' 8"  (172.7 cm) Weight: (!) 197.6 kg (435 lb 10.1 oz) IBW/kg (Calculated) : 68.4  Temp (24hrs), Avg:98.5 F (36.9 C), Min:97.4 F (36.3 C), Max:99.4 F (37.4 C)  Recent Labs  Lab 01/07/20 1535 01/07/20 1536 01/07/20 1733 01/07/20 2147 01/08/20 0252 01/08/20 0654  WBC  --  8.5  --   --   --  6.8  CREATININE  --  0.86  --   --   --   --   LATICACIDVEN 5.7*  --  6.2* 5.3* 4.5*  --     Estimated Creatinine Clearance: 157.1 mL/min (by C-G formula based on SCr of 0.86 mg/dL).    No Known Allergies  Antimicrobials this admission: Cefepime 2g x1 (11/22) Ceftriaxone 2g q24 (11/22 >> Vancomycin 1.5g q12H (11/22>>  Dose adjustments this admission: n/a  Microbiology results: 11/22 BCx: NG@24h  11/22 MRSA PCR: Negative  Thank you for allowing pharmacy to be a part of this patient's care.  12/22 01/08/2020 12:49 PM

## 2020-01-08 NOTE — Consult Note (Signed)
CARDIOLOGY CONSULT NOTE               Patient ID: [name] [name] MRN: [id number] DOB/AGE: [birth year] 59 y.o.  Admit date: 01/07/2020 Referring Physician Dr Marrion Coy hospitalist Primary Physician Dr Saralyn Pilar primary Primary Cardiologist [facility] clinic Dr. Rudi Heap Reason for Consultation atrial fibrillation congestive heart failure  HPI: Patient is a 59 year old morbidly obese white male history of congestive heart failure lymphedema lower extremities moderate severe COPD with chronic respiratory failure atrial fibrillation hypertension hyperlipidemia diabetes.  Patient was admitted with what appeared to be cellulitis lower extremities with respiratory failure placed in ICU placed on broad-spectrum antibiotic therapy and respiratory support also complained of some chest pain and the EKG suggested atrial fibrillation rapid ventricular response cardiology was recommended for further assessment  Review of systems complete and found to be negative unless listed above     Past Medical History:  Diagnosis Date  . Allergy   . Anxiety   . Arthritis   . Asthma   . Brain damage   . Chronic pain of both knees 07/13/2018  . Clotting disorder (HCC)   . COPD (chronic obstructive pulmonary disease) (HCC)   . Depression   . GERD (gastroesophageal reflux disease)   . HFrEF (heart failure with reduced ejection fraction) (HCC)    a. 03/2018 Echo: EF 25-30%, diff HK. Mod LAE.  [name] History of DVT (deep vein thrombosis)   . History of pulmonary embolism    a. Chronic coumadin.  [name] Hypertension   . MI (myocardial infarction) (HCC)   . Morbid obesity (HCC)   . Neck pain 07/13/2018  . NICM (nonischemic cardiomyopathy) (HCC)    a. s/p Cath x 3 - reportedly nl cors. Last cath 2019 in [place]; b. a. 03/2018 Echo: EF 25-30%, diff HK.  [name] Persistent atrial fibrillation (HCC)    a. 03/2018 s/p DCCV; b. CHA2DS2VASc = 1-->Xarelto (later changed to warfarin); c. 05/2018 recurrent  afib-->Amio initiated.  . Sleep apnea   . Sleep apnea     Past Surgical History:  Procedure Laterality Date  . CARDIOVERSION N/A 03/24/2018   Procedure: CARDIOVERSION;  Surgeon: Antonieta Iba, MD;  Location: [facility] ORS;  Service: Cardiovascular;  Laterality: N/A;  . CARDIOVERSION N/A 08/08/2018   Procedure: CARDIOVERSION;  Surgeon: [name], MD;  Location: [facility] ORS;  Service: Cardiovascular;  Laterality: N/A;  . hearnia repair     X 3- total of two surgeries  . HERNIA REPAIR    . LEG SURGERY      Medications Prior to Admission  Medication Sig Dispense Refill Last Dose  . albuterol (VENTOLIN HFA) 108 (90 Base) MCG/ACT inhaler Inhale 2 puffs into the lungs every 4 (four) hours as needed for wheezing or shortness of breath. 8 g 2   . allopurinol (ZYLOPRIM) 100 MG tablet TAKE 1 TABLET BY MOUTH ONCE DAILY (Patient taking differently: Take 100 mg by mouth daily. ) 60 tablet 0   . atorvastatin (LIPITOR) 40 MG tablet TAKE 1 TABLET BY MOUTH ONCE DAILY (Patient taking differently: Take 40 mg by mouth daily. ) 90 tablet 1   . DULoxetine (CYMBALTA) 20 MG capsule Take 1 capsule (20 mg total) by mouth daily. 90 capsule 1   . fluticasone (FLONASE) 50 MCG/ACT nasal spray Place 2 sprays into both nostrils 2 (two) times daily.      . fluticasone furoate-vilanterol (BREO ELLIPTA) 100-25 MCG/INH AEPB Inhale 1 puff into the lungs daily. 28 each 0   . HYDROcodone-acetaminophen (NORCO)  7.5-325 MG tablet Take 1 tablet by mouth every 8 (eight) hours as needed for moderate pain or severe pain. 15 tablet 0   . levothyroxine (SYNTHROID) 50 MCG tablet Take 1 tablet (50 mcg total) by mouth daily before breakfast. 90 tablet 1   . lisinopril (ZESTRIL) 2.5 MG tablet Take 1 tablet (2.5 mg total) by mouth daily. 90 tablet 0   . loperamide (IMODIUM) 2 MG capsule Take 1 capsule (2 mg total) by mouth as needed for diarrhea or loose stools. 30 capsule 0   . metoprolol succinate (TOPROL-XL) 50 MG 24 hr tablet Take 1  tablet (50 mg total) by mouth daily. Take with or immediately following a meal. 30 tablet 4   . montelukast (SINGULAIR) 10 MG tablet Take 1 tablet (10 mg total) by mouth at bedtime. 30 tablet 11   . naphazoline-glycerin (CLEAR EYES REDNESS) 0.012-0.2 % SOLN Place 1-2 drops into both eyes 4 (four) times daily as needed for eye irritation. 15 mL 0   . nitroGLYCERIN (NITROSTAT) 0.4 MG SL tablet Place 1 tablet under tongue every 5 minutes as needed for chest pain. (No more than 3 doses within 15 minutes) 15 tablet 3   . SUMAtriptan (IMITREX) 50 MG tablet Take 1 tablet (50 mg total) by mouth every 2 (two) hours as needed for migraine or headache. May repeat in 2 hours if headache persists or recurs. No more than 2 doses in a day 10 tablet 0   . thiamine 100 MG tablet Take 1 tablet (100 mg total) by mouth daily. 30 tablet 0   . torsemide (DEMADEX) 20 MG tablet Take 2 tablets (40 mg total) by mouth 2 (two) times daily.     . traZODone (DESYREL) 150 MG tablet Take 1 tablet (150 mg total) by mouth at bedtime. 90 tablet 1   . warfarin (COUMADIN) 5 MG tablet Take 5 mg by mouth daily.       Social History   Socioeconomic History  . Marital status: Single    Spouse name: Not on file  . Number of children: Not on file  . Years of education: Not on file  . Highest education level: Not on file  Occupational History  . Not on file  Tobacco Use  . Smoking status: Never Smoker  . Smokeless tobacco: Never Used  Vaping Use  . Vaping Use: Never used  Substance and Sexual Activity  . Alcohol use: Yes  . Drug use: Never  . Sexual activity: Not Currently  Other Topics Concern  . Not on file  Social History Narrative  . Not on file   Social Determinants of Health   Financial Resource Strain: Medium Risk  . Difficulty of Paying Living Expenses: Somewhat hard  Food Insecurity: No Food Insecurity  . Worried About Programme researcher, broadcasting/film/video in the Last Year: Never true  . Ran Out of Food in the Last Year: Never  true  Transportation Needs: Unmet Transportation Needs  . Lack of Transportation (Medical): Yes  . Lack of Transportation (Non-Medical): Yes  Physical Activity: Inactive  . Days of Exercise per Week: 0 days  . Minutes of Exercise per Session: 0 min  Stress: Stress Concern Present  . Feeling of Stress : Rather much  Social Connections: Socially Isolated  . Frequency of Communication with Friends and Family: Once a week  . Frequency of Social Gatherings with Friends and Family: Once a week  . Attends Religious Services: Never  . Active Member of Clubs or  Organizations: No  . Attends Banker Meetings: Never  . Marital Status: Widowed  Intimate Partner Violence:   . Fear of Current or Ex-Partner: Not on file  . Emotionally Abused: Not on file  . Physically Abused: Not on file  . Sexually Abused: Not on file    Family History  Problem Relation Age of Onset  . Heart failure Mother   . Lung cancer Mother   . Lung cancer Father   . Heart attack Maternal Grandmother   . Heart attack Maternal Grandfather       Review of systems complete and found to be negative unless listed above      PHYSICAL EXAM  General: Well developed, well nourished, in no acute distress HEENT:  Normocephalic and atramatic Neck:  No JVD.  Lungs: Clear bilaterally to auscultation and percussion. Heart: HRRR . Normal S1 and S2 without gallops or murmurs.  Abdomen: Bowel sounds are positive, abdomen soft and non-tender  Msk:  Back normal, normal gait. Normal strength and tone for age. Extremities: No clubbing, cyanosis or edema.   Neuro: Alert and oriented X 3. Psych:  Good affect, responds appropriately  Labs:   Lab Results  Component Value Date   WBC 6.8 01/08/2020   HGB 11.5 (L) 01/08/2020   HCT 35.0 (L) 01/08/2020   MCV 90.2 01/08/2020   PLT 146 (L) 01/08/2020    Recent Labs  Lab 01/07/20 1536  NA 137  K 4.1  CL 98  CO2 24  BUN 12  CREATININE 0.86  CALCIUM 7.8*  PROT  6.8  BILITOT 0.8  ALKPHOS 73  ALT 22  AST 45*  GLUCOSE 159*   Lab Results  Component Value Date   CKTOTAL 349 08/09/2019   TROPONINI 0.04 (HH) 08/07/2018    Lab Results  Component Value Date   CHOL 175 11/08/2019   CHOL 163 06/23/2019   CHOL 166 05/02/2019   Lab Results  Component Value Date   HDL 75 11/08/2019   HDL 79 06/23/2019   HDL 70 05/02/2019   Lab Results  Component Value Date   LDLCALC 87 11/08/2019   LDLCALC 65 06/23/2019   LDLCALC 75 05/02/2019   Lab Results  Component Value Date   TRIG 63 11/08/2019   TRIG 95 06/23/2019   TRIG 122 05/02/2019   Lab Results  Component Value Date   CHOLHDL 2.3 11/08/2019   CHOLHDL 2.1 06/23/2019   CHOLHDL 3.2 01/22/2019   No results found for: LDLDIRECT    Radiology: CT ANGIO CHEST PE W OR WO CONTRAST  Result Date: 12/22/2019 CLINICAL DATA:  Chest pain and shortness of breath EXAM: CT ANGIOGRAPHY CHEST WITH CONTRAST TECHNIQUE: Multidetector CT imaging of the chest was performed using the standard protocol during bolus administration of intravenous contrast. Multiplanar CT image reconstructions and MIPs were obtained to evaluate the vascular anatomy. CONTRAST:  OMNIPAQUE IOHEXOL 350 MG/ML SOLN COMPARISON:  Radiograph same day FINDINGS: Cardiovascular: There is slightly suboptimal opacification of the main pulmonary artery, however no central or proximal segmental pulmonary embolism. Mild cardiomegaly is seen. No pericardial effusion or thickening. No evidence right heart strain. There is a common origin of the left carotid artery off the brachiocephalic trunk. No stenosis is seen. There is scattered aortic atherosclerosis. There is coronary artery calcifications. Mediastinum/Nodes: No hilar, mediastinal, or axillary adenopathy. Thyroid gland, trachea, and esophagus demonstrate no significant findings. Lungs/Pleura: Minimal bibasilar dependent atelectasis. There is patchy airspace opacity seen at the posterior left lung  base  with a trace left pleural effusion. No pleural effusion or pneumothorax. No airspace consolidation. Upper Abdomen: No acute abnormalities present in the visualized portions of the upper abdomen. There is elevation of the right hemidiaphragm. Musculoskeletal: No chest wall abnormality. No acute or significant osseous findings. Endplate reactive changes are noted within the mid to lower thoracic spine. Review of the MIP images confirms the above findings. IMPRESSION: Slightly suboptimal opacification of the main pulmonary artery, however no central or segmental pulmonary embolism. Mild patchy airspace opacity at the posterior left lung base with a trace pleural effusion which could be due to atelectasis and/or early infectious etiology. Electronically Signed   By: Jonna Clark M.D.   On: 12/22/2019 23:44   US Venous Img Lower Bilateral (DVT)  Result Date: 01/08/2020 CLINICAL DATA:  Bilateral lower extremity pain and edema. Evaluate for DVT. EXAM: BILATERAL LOWER EXTREMITY VENOUS DOPPLER ULTRASOUND TECHNIQUE: Gray-scale sonography with graded compression, as well as color Doppler and duplex ultrasound were performed to evaluate the lower extremity deep venous systems from the level of the common femoral vein and including the common femoral, femoral, profunda femoral, popliteal and calf veins including the posterior tibial, peroneal and gastrocnemius veins when visible. The superficial great saphenous vein was also interrogated. Spectral Doppler was utilized to evaluate flow at rest and with distal augmentation maneuvers in the common femoral, femoral and popliteal veins. COMPARISON:  Bilateral lower extremity venous Doppler ultrasound-12/22/2019; 01/21/2019; 10/24/2018 (all negative); left lower extremity venous Doppler ultrasound-11/07/2019 (negative FINDINGS: Examination is degraded due to patient body habitus and poor sonographic window RIGHT LOWER EXTREMITY Common Femoral Vein: No evidence of thrombus.  Normal compressibility, respiratory phasicity and response to augmentation. Saphenofemoral Junction: No evidence of thrombus. Normal compressibility and flow on color Doppler imaging. Profunda Femoral Vein: No evidence of thrombus. Normal compressibility and flow on color Doppler imaging. Femoral Vein: No evidence of thrombus. Normal compressibility, respiratory phasicity and response to augmentation. Popliteal Vein: No evidence of thrombus. Normal compressibility, respiratory phasicity and response to augmentation. Calf Veins: Appear patent where visualized. Superficial Great Saphenous Vein: No evidence of thrombus. Normal compressibility. Venous Reflux:  None. Other Findings:  None. LEFT LOWER EXTREMITY Common Femoral Vein: No evidence of thrombus. Normal compressibility, respiratory phasicity and response to augmentation. Saphenofemoral Junction: No evidence of thrombus. Normal compressibility and flow on color Doppler imaging. Profunda Femoral Vein: No evidence of thrombus. Normal compressibility and flow on color Doppler imaging. Femoral Vein: No evidence of thrombus. Normal compressibility, respiratory phasicity and response to augmentation. Popliteal Vein: No evidence of thrombus. Normal compressibility, respiratory phasicity and response to augmentation. Calf Veins: Appear patent where visualized. Superficial Great Saphenous Vein: No evidence of thrombus. Normal compressibility. Venous Reflux:  None. Other Findings:  None. IMPRESSION: No evidence of DVT within either lower extremity. Note, this is the 5th negative lower extremity venous Doppler ultrasound since 10/24/2018. Electronically Signed   By: Simonne Come M.D.   On: 01/08/2020 07:49   US Venous Img Lower Bilateral (DVT)  Result Date: 12/22/2019 CLINICAL DATA:  Shortness of breath. EXAM: BILATERAL LOWER EXTREMITY VENOUS DOPPLER ULTRASOUND TECHNIQUE: Gray-scale sonography with compression, as well as color and duplex ultrasound, were performed to  evaluate the deep venous system(s) from the level of the common femoral vein through the popliteal and proximal calf veins. COMPARISON:  Left lower extremity duplex 11/07/2019. FINDINGS: VENOUS Normal compressibility of the common femoral, superficial femoral, and popliteal veins, as well as the visualized calf veins. Visualized portions of profunda femoral vein and great  saphenous vein unremarkable. No filling defects to suggest DVT on grayscale or color Doppler imaging. Doppler waveforms show normal direction of venous flow, normal respiratory plasticity and response to augmentation. OTHER None. Limitations: Patient body habitus and medical condition (on BiPAP). IMPRESSION: No evidence of bilateral lower extremity DVT. Electronically Signed   By: Narda Rutherford M.D.   On: 12/22/2019 22:07   DG Chest Portable 1 View  Result Date: 01/07/2020 CLINICAL DATA:  Respiratory distress, COPD exacerbation, asthma, hypertension EXAM: PORTABLE CHEST 1 VIEW COMPARISON:  Portable exam 1545 hours compared to 12/22/2019 FINDINGS: Enlargement of cardiac silhouette. Mediastinal contours and pulmonary vascularity normal. Lungs grossly clear. No definite infiltrate, pleural effusion, or pneumothorax. Osseous structures unremarkable. IMPRESSION: Enlargement of cardiac silhouette. No acute abnormalities. Electronically Signed   By: Ulyses Southward M.D.   On: 01/07/2020 16:00   DG Chest Port 1 View  Result Date: 12/22/2019 CLINICAL DATA:  Respiratory distress. EXAM: PORTABLE CHEST 1 VIEW COMPARISON:  Chest x-ray dated November 07, 2019. FINDINGS: Unchanged cardiomegaly and pulmonary vascular congestion. No focal consolidation, pleural effusion, or pneumothorax. No acute osseous abnormality. IMPRESSION: 1. Similar cardiomegaly and pulmonary vascular congestion without overt edema. Electronically Signed   By: Obie Dredge M.D.   On: 12/22/2019 14:38   ECHOCARDIOGRAM COMPLETE  Result Date: 12/24/2019    ECHOCARDIOGRAM REPORT    Patient Name:   [name] [name] Date of Exam: 12/23/2019 Medical Rec #:  [id number]       Height:       68.0 in Accession #:    [id number]      Weight:       410.0 lb Date of Birth:  1960/09/18       BSA:          2.773 m Patient Age:    59 years        BP:           158/99 mmHg Patient Gender: M               HR:           70 bpm. Exam Location:  [facility] Procedure: 2D Echo and Intracardiac Opacification Agent Indications:     Dyspnea  History:         Patient has prior history of Echocardiogram examinations.                  Cardiomyopathy and CHF, COPD; Arrythmias:Atrial Fibrillation.  Sonographer:     L Thornton-Maynard Referring Phys:  [id number] AMY N COX Diagnosing Phys: Harold Hedge MD  Sonographer Comments: Image acquisition challenging due to patient body habitus. IMPRESSIONS  1. Left ventricular ejection fraction, by estimation, is 55 to 60%. The left ventricle has normal function. Left ventricular endocardial border not optimally defined to evaluate regional wall motion. There is mild left ventricular hypertrophy. Left ventricular diastolic parameters were normal.  2. Right ventricular systolic function is normal. The right ventricular size is normal. There is normal pulmonary artery systolic pressure.  3. The mitral valve was not well visualized. Trivial mitral valve regurgitation.  4. The aortic valve was not well visualized. Aortic valve regurgitation is not visualized. FINDINGS  Left Ventricle: Left ventricular ejection fraction, by estimation, is 55 to 60%. The left ventricle has normal function. Left ventricular endocardial border not optimally defined to evaluate regional wall motion. Definity contrast agent was given IV to delineate the left ventricular endocardial borders. The left ventricular internal cavity size was normal in size. There is mild left  ventricular hypertrophy. Left ventricular diastolic parameters were normal. Right Ventricle: The right ventricular size is normal. No increase in right  ventricular wall thickness. Right ventricular systolic function is normal. There is normal pulmonary artery systolic pressure. The tricuspid regurgitant velocity is 2.14 m/s, and  with an assumed right atrial pressure of 3 mmHg, the estimated right ventricular systolic pressure is 21.3 mmHg. Left Atrium: Left atrial size was normal in size. Right Atrium: Right atrial size was normal in size. Pericardium: There is no evidence of pericardial effusion. Mitral Valve: The mitral valve was not well visualized. Trivial mitral valve regurgitation. Tricuspid Valve: The tricuspid valve is not well visualized. Tricuspid valve regurgitation is not demonstrated. Aortic Valve: The aortic valve was not well visualized. Aortic valve regurgitation is not visualized. Aortic valve mean gradient measures 6.0 mmHg. Aortic valve peak gradient measures 8.9 mmHg. Aortic valve area, by VTI measures 2.30 cm. Pulmonic Valve: The pulmonic valve was not well visualized. Pulmonic valve regurgitation is not visualized. Aorta: The aortic root was not well visualized. IAS/Shunts: The interatrial septum was not assessed.  LEFT VENTRICLE PLAX 2D LVOT diam:     2.30 cm  Diastology LV SV:         63       LV e' medial:    6.31 cm/s LV SV Index:   23       LV E/e' medial:  14.5 LVOT Area:     4.15 cm LV e' lateral:   11.70 cm/s                         LV E/e' lateral: 7.8  RIGHT VENTRICLE RV S prime:     16.00 cm/s TAPSE (M-mode): 2.3 cm LEFT ATRIUM             Index LA Vol (A2C):   84.0 ml 30.29 ml/m LA Vol (A4C):   91.1 ml 32.85 ml/m LA Biplane Vol: 87.1 ml 31.41 ml/m  AORTIC VALVE AV Area (Vmax):    2.24 cm AV Area (Vmean):   1.95 cm AV Area (VTI):     2.30 cm AV Vmax:           149.00 cm/s AV Vmean:          120.000 cm/s AV VTI:            0.275 m AV Peak Grad:      8.9 mmHg AV Mean Grad:      6.0 mmHg LVOT Vmax:         80.30 cm/s LVOT Vmean:        56.400 cm/s LVOT VTI:          0.152 m LVOT/AV VTI ratio: 0.55 MITRAL VALVE                TRICUSPID VALVE MV Area (PHT): 3.33 cm    TR Peak grad:   18.3 mmHg MV E velocity: 91.67 cm/s  TR Vmax:        214.00 cm/s                             SHUNTS                            Systemic VTI:  0.15 m  Systemic Diam: 2.30 cm Harold Hedge MD Electronically signed by Harold Hedge MD Signature Date/Time: 12/24/2019/1:15:06 PM    Final    US ABDOMEN LIMITED RUQ (LIVER/GB)  Result Date: 01/08/2020 CLINICAL DATA:  Right upper quadrant pain EXAM: ULTRASOUND ABDOMEN LIMITED RIGHT UPPER QUADRANT COMPARISON:  None. FINDINGS: Gallbladder: No gallstones or wall thickening visualized. No sonographic Murphy sign noted by sonographer. Common bile duct: Diameter: Normal caliber, 5 mm Liver: Increased echotexture compatible with fatty infiltration. 3.5 cm simple appearing cyst in the right hepatic lobe. Portal vein is patent on color Doppler imaging with normal direction of blood flow towards the liver. Other: None. IMPRESSION: Hepatic steatosis. Electronically Signed   By: Charlett Nose M.D.   On: 01/08/2020 10:44    EKG: Atrial fibrillation rapid ventricular sponsor rate of 120 nonspecific ST-T wave changes  ASSESSMENT AND PLAN:  CHF Atrial fibrillation rapid ventricular response SOB Nonischemic cardiomyopathy EF of 25 to 30% Leg Edema Morbid Obesity OSA Cellulitis Sepsis History of DVT on chronic anticoagulation Acute on chronic respiratory failure with hypoxemia Coronary artery disease . Plan Agree with ICU level care for sepsis Continue broad-spectrum antibiotic therapy Continue current therapy for heart failure cardiomyopathy Respiratory support BiPAP or CPAP for hypoxemia respiratory failure Inhalers as necessary Consider steroid therapy for respiratory failure Maintain anticoagulation for A. fib DVT prophylaxis Recommend rate control for atrial fibrillation Morbid obesity rec requires significant weight loss exercise portion control Maintain hypertension  management and control Flat troponins suggestive of demand ischemia Chest pain appears to be nonischemic recommend conservative management Do not recommend any invasive procedures  Signed: Alwyn Pea MD 01/08/2020, 11:02 AM

## 2020-01-08 NOTE — Consult Note (Signed)
WOC Nurse Consult Note: Reason for Consult:Cellulitis to bilateral lower legs.  Nonintact lesions to bilateral malleolus circumferential.  Appearance consistent with  compression wraps wrinkling and created wounds.  Wound type: trauma Pressure Injury POA: NA Measurement: 1 cm x 9 cm x 0.1 cm circumferentially around malleolus.  Wound bed:red and moist Drainage (amount, consistency, odor) moderate serous weeping.  No odor.   Periwound:edema and tender to touch   Dressing procedure/placement/frequency: Cleanse legs with soap and water and pat dry. Apply Aquacel Ag to open wounds Wrap both legs with kerlix and secure with ace wraps.  Will consider Unna boots when less tender and swollen.  Will not follow at this time.  Please re-consult if needed.  Maple Hudson MSN, RN, FNP-BC CWON Wound, Ostomy, Continence Nurse Pager [phone]

## 2020-01-08 NOTE — Consult Note (Addendum)
ANTICOAGULATION CONSULT NOTE  Pharmacy Consult for Heparin Infusion Indication: chest pain/ACS / Afib / VTE prophylaxis  Patient Measurements: Heparin Dosing Weight: 119 kg  Labs: Recent Labs    01/07/20 1536 01/07/20 1955 01/07/20 2147 01/07/20 2332 01/08/20 0654 01/08/20 1624  HGB 12.8*  --   --   --  11.5*  --   HCT 39.9  --   --   --  35.0*  --   PLT 282  --   --   --  146*  --   APTT  --   --   --  144*  --   --   LABPROT  --   --  14.4 15.9*  --   --   INR  --   --  1.2 1.3*  --   --   HEPARINUNFRC  --   --   --   --  <0.10* 0.40  CREATININE 0.86  --   --   --   --   --   TROPONINIHS 77* 90*  --   --   --   --     Estimated Creatinine Clearance: 157.1 mL/min (by C-G formula based on SCr of 0.86 mg/dL).   Medical History: Past Medical History:  Diagnosis Date  . Allergy   . Anxiety   . Arthritis   . Asthma   . Brain damage   . Chronic pain of both knees 07/13/2018  . Clotting disorder (HCC)   . COPD (chronic obstructive pulmonary disease) (HCC)   . Depression   . GERD (gastroesophageal reflux disease)   . HFrEF (heart failure with reduced ejection fraction) (HCC)    a. 03/2018 Echo: EF 25-30%, diff HK. Mod LAE.  [name] History of DVT (deep vein thrombosis)   . History of pulmonary embolism    a. Chronic coumadin.  [name] Hypertension   . MI (myocardial infarction) (HCC)   . Morbid obesity (HCC)   . Neck pain 07/13/2018  . NICM (nonischemic cardiomyopathy) (HCC)    a. s/p Cath x 3 - reportedly nl cors. Last cath 2019 in [place]; b. a. 03/2018 Echo: EF 25-30%, diff HK.  [name] Persistent atrial fibrillation (HCC)    a. 03/2018 s/p DCCV; b. CHA2DS2VASc = 1-->Xarelto (later changed to warfarin); c. 05/2018 recurrent afib-->Amio initiated.  . Sleep apnea   . Sleep apnea     Medications:  Warfarin 5 mg daily prior to admission  Per RN, patient is un-sure of how he takes warfarin at home but does report compliance  Assessment: Patient is a 59 y/o M with medical history as above  who presented to the ED 11/22 with shortness of breath. Troponin 77 >> 90. Pharmacy has been consulted to initiate heparin infusion for ACS. There was also concern for left lower leg DVT and ultrasound was negative.  Baseline INR 1.2, aPTT 144 (drawn after initiation of infusion). Baseline CBC with Hgb 12.8, platelets 282.   Hgb down to 11.5 and platelets decreased to 146 today  Goal of Therapy:  Heparin level 0.3-0.7 units/ml Monitor platelets by anticoagulation protocol: Yes   Plan:  --11/23 at 1624 HL = 0.4, therapeutic x 1. Will maintain heparin infusion at current rate of 1950 units/hr --Confirmatory HL at 2200 --Daily CBC per protocol. Hgb and platelets decreased today. Monitor closely. --Continue to follow clinical course and re-initiation of warfarin when appropriate  Tressie Ellis 01/08/2020,4:59 PM

## 2020-01-08 NOTE — Consult Note (Signed)
ANTICOAGULATION CONSULT NOTE  Pharmacy Consult for Heparin Infusion Indication: chest pain/ACS / Afib / VTE prophylaxis  Patient Measurements: Height: 5\' 8"  (172.7 cm) Weight: (!) 197.6 kg (435 lb 10.1 oz) IBW/kg (Calculated) : 68.4 Heparin Dosing Weight: 119 kg  Labs: Recent Labs    01/07/20 1536 01/07/20 1955 01/07/20 2147 01/07/20 2332 01/08/20 0654  HGB 12.8*  --   --   --  11.5*  HCT 39.9  --   --   --  35.0*  PLT 282  --   --   --  146*  APTT  --   --   --  144*  --   LABPROT  --   --  14.4 15.9*  --   INR  --   --  1.2 1.3*  --   HEPARINUNFRC  --   --   --   --  <0.10*  CREATININE 0.86  --   --   --   --   TROPONINIHS 77* 90*  --   --   --     Estimated Creatinine Clearance: 157.1 mL/min (by C-G formula based on SCr of 0.86 mg/dL).   Medical History: Past Medical History:  Diagnosis Date  . Allergy   . Anxiety   . Arthritis   . Asthma   . Brain damage   . Chronic pain of both knees 07/13/2018  . Clotting disorder (HCC)   . COPD (chronic obstructive pulmonary disease) (HCC)   . Depression   . GERD (gastroesophageal reflux disease)   . HFrEF (heart failure with reduced ejection fraction) (HCC)    a. 03/2018 Echo: EF 25-30%, diff HK. Mod LAE.  04/2018 History of DVT (deep vein thrombosis)   . History of pulmonary embolism    a. Chronic coumadin.  [name] Hypertension   . MI (myocardial infarction) (HCC)   . Morbid obesity (HCC)   . Neck pain 07/13/2018  . NICM (nonischemic cardiomyopathy) (HCC)    a. s/p Cath x 3 - reportedly nl cors. Last cath 2019 in [place]; b. a. 03/2018 Echo: EF 25-30%, diff HK.  04/2018 Persistent atrial fibrillation (HCC)    a. 03/2018 s/p DCCV; b. CHA2DS2VASc = 1-->Xarelto (later changed to warfarin); c. 05/2018 recurrent afib-->Amio initiated.  . Sleep apnea   . Sleep apnea     Medications:  Warfarin 5 mg daily prior to admission  Per RN, patient is un-sure of how he takes warfarin at home but does report compliance  Assessment: Patient is a 59  y/o M with medical history as above who presented to the ED 11/22 with shortness of breath. Troponin 77 >> 90. Pharmacy has been consulted to initiate heparin infusion for ACS. There is also concern for left lower leg DVT and ultrasound has been ordered.  Baseline INR 1.2, aPTT pending. Baseline CBC with Hgb 12.8, platelets 282.   Goal of Therapy:  Heparin level 0.3-0.7 units/ml Monitor platelets by anticoagulation protocol: Yes   Plan:  --Heparin 3500 unit IV bolus x 1 followed by continuous infusion at 1950 units/hr --Heparin level 6 hours after initiation of infusion --Daily CBC per protocol --Continue to follow clinical course and re-initiation of warfarin when appropriate  12/22 01/08/2020,9:43 AM

## 2020-01-08 NOTE — Progress Notes (Signed)
Dr Chipper Herb notified of pt's lactic acid results and elevated HR during activity. No new orders obtained

## 2020-01-09 DIAGNOSIS — I4891 Unspecified atrial fibrillation: Secondary | ICD-10-CM | POA: Diagnosis not present

## 2020-01-09 DIAGNOSIS — I2510 Atherosclerotic heart disease of native coronary artery without angina pectoris: Secondary | ICD-10-CM | POA: Diagnosis not present

## 2020-01-09 DIAGNOSIS — I482 Chronic atrial fibrillation, unspecified: Secondary | ICD-10-CM | POA: Diagnosis not present

## 2020-01-09 DIAGNOSIS — J9621 Acute and chronic respiratory failure with hypoxia: Secondary | ICD-10-CM | POA: Diagnosis not present

## 2020-01-09 DIAGNOSIS — I5023 Acute on chronic systolic (congestive) heart failure: Secondary | ICD-10-CM | POA: Diagnosis not present

## 2020-01-09 DIAGNOSIS — L03115 Cellulitis of right lower limb: Secondary | ICD-10-CM | POA: Diagnosis not present

## 2020-01-09 DIAGNOSIS — R0602 Shortness of breath: Secondary | ICD-10-CM | POA: Diagnosis not present

## 2020-01-09 DIAGNOSIS — R6521 Severe sepsis with septic shock: Secondary | ICD-10-CM | POA: Diagnosis not present

## 2020-01-09 DIAGNOSIS — A419 Sepsis, unspecified organism: Secondary | ICD-10-CM | POA: Diagnosis not present

## 2020-01-09 LAB — CBC WITH DIFFERENTIAL/PLATELET
Abs Immature Granulocytes: 0.03 10*3/uL (ref 0.00–0.07)
Basophils Absolute: 0 10*3/uL (ref 0.0–0.1)
Basophils Relative: 0 %
Eosinophils Absolute: 0.6 10*3/uL — ABNORMAL HIGH (ref 0.0–0.5)
Eosinophils Relative: 8 %
HCT: 36 % — ABNORMAL LOW (ref 39.0–52.0)
Hemoglobin: 11.8 g/dL — ABNORMAL LOW (ref 13.0–17.0)
Immature Granulocytes: 0 %
Lymphocytes Relative: 12 %
Lymphs Abs: 0.9 10*3/uL (ref 0.7–4.0)
MCH: 29.8 pg (ref 26.0–34.0)
MCHC: 32.8 g/dL (ref 30.0–36.0)
MCV: 90.9 fL (ref 80.0–100.0)
Monocytes Absolute: 0.4 10*3/uL (ref 0.1–1.0)
Monocytes Relative: 5 %
Neutro Abs: 5.6 10*3/uL (ref 1.7–7.7)
Neutrophils Relative %: 75 %
Platelets: 151 10*3/uL (ref 150–400)
RBC: 3.96 MIL/uL — ABNORMAL LOW (ref 4.22–5.81)
RDW: 15.4 % (ref 11.5–15.5)
WBC: 7.6 10*3/uL (ref 4.0–10.5)
nRBC: 0 % (ref 0.0–0.2)

## 2020-01-09 LAB — GASTROINTESTINAL PANEL BY PCR, STOOL (REPLACES STOOL CULTURE)

## 2020-01-09 LAB — COMPREHENSIVE METABOLIC PANEL
ALT: 22 U/L (ref 0–44)
AST: 43 U/L — ABNORMAL HIGH (ref 15–41)
Albumin: 3.2 g/dL — ABNORMAL LOW (ref 3.5–5.0)
Alkaline Phosphatase: 74 U/L (ref 38–126)
Anion gap: 12 (ref 5–15)
BUN: 7 mg/dL (ref 6–20)
CO2: 25 mmol/L (ref 22–32)
Calcium: 7.5 mg/dL — ABNORMAL LOW (ref 8.9–10.3)
Chloride: 94 mmol/L — ABNORMAL LOW (ref 98–111)
Creatinine, Ser: 0.83 mg/dL (ref 0.61–1.24)
GFR, Estimated: >60 mL/min (ref >60)
Glucose, Bld: 170 mg/dL — ABNORMAL HIGH (ref 70–99)
Potassium: 3 mmol/L — ABNORMAL LOW (ref 3.5–5.1)
Sodium: 131 mmol/L — ABNORMAL LOW (ref 135–145)
Total Bilirubin: 1.2 mg/dL (ref 0.3–1.2)
Total Protein: 6.4 g/dL — ABNORMAL LOW (ref 6.5–8.1)

## 2020-01-09 LAB — C DIFFICILE QUICK SCREEN W PCR REFLEX
C Diff antigen: NEGATIVE
C Diff interpretation: NOT DETECTED
C Diff toxin: NEGATIVE

## 2020-01-09 LAB — HEPARIN LEVEL (UNFRACTIONATED)
Heparin Unfractionated: 0.26 IU/mL — ABNORMAL LOW (ref 0.30–0.70)
Heparin Unfractionated: 0.29 IU/mL — ABNORMAL LOW (ref 0.30–0.70)

## 2020-01-09 LAB — TSH: TSH: 10.611 u[IU]/mL — ABNORMAL HIGH (ref 0.350–4.500)

## 2020-01-09 LAB — MAGNESIUM: Magnesium: 1.2 mg/dL — ABNORMAL LOW (ref 1.7–2.4)

## 2020-01-09 MED ORDER — AMIODARONE HCL IN DEXTROSE 360-4.14 MG/200ML-% IV SOLN
30.0000 mg/h | INTRAVENOUS | Status: DC
  Administered 2020-01-10: 30 mg/h via INTRAVENOUS
  Filled 2020-01-09: qty 200

## 2020-01-09 MED ORDER — LOPERAMIDE HCL 2 MG PO CAPS
2.0000 mg | ORAL_CAPSULE | ORAL | Status: DC | PRN
  Administered 2020-01-09 – 2020-01-12 (×4): 2 mg via ORAL
  Filled 2020-01-09 (×4): qty 1

## 2020-01-09 MED ORDER — WARFARIN SODIUM 10 MG PO TABS
10.0000 mg | ORAL_TABLET | Freq: Once | ORAL | Status: AC
  Administered 2020-01-09: 10 mg via ORAL
  Filled 2020-01-09: qty 1

## 2020-01-09 MED ORDER — MAGNESIUM SULFATE 4 GM/100ML IV SOLN
4.0000 g | Freq: Once | INTRAVENOUS | Status: AC
  Administered 2020-01-09: 4 g via INTRAVENOUS
  Filled 2020-01-09: qty 100

## 2020-01-09 MED ORDER — AMIODARONE HCL IN DEXTROSE 360-4.14 MG/200ML-% IV SOLN
60.0000 mg/h | INTRAVENOUS | Status: AC
  Administered 2020-01-09 (×2): 60 mg/h via INTRAVENOUS
  Filled 2020-01-09 (×2): qty 200

## 2020-01-09 MED ORDER — AMIODARONE LOAD VIA INFUSION
150.0000 mg | Freq: Once | INTRAVENOUS | Status: AC
  Administered 2020-01-09: 150 mg via INTRAVENOUS
  Filled 2020-01-09: qty 83.34

## 2020-01-09 MED ORDER — WARFARIN - PHARMACIST DOSING INPATIENT
Freq: Every day | Status: DC
  Administered 2020-01-10: 1

## 2020-01-09 MED ORDER — FUROSEMIDE 10 MG/ML IJ SOLN
40.0000 mg | Freq: Two times a day (BID) | INTRAMUSCULAR | Status: DC
  Administered 2020-01-09 – 2020-01-11 (×4): 40 mg via INTRAVENOUS
  Filled 2020-01-09 (×4): qty 4

## 2020-01-09 MED ORDER — LOPERAMIDE HCL 2 MG PO CAPS
4.0000 mg | ORAL_CAPSULE | Freq: Once | ORAL | Status: AC
  Administered 2020-01-09: 4 mg via ORAL
  Filled 2020-01-09: qty 2

## 2020-01-09 MED ORDER — METOLAZONE 5 MG PO TABS
5.0000 mg | ORAL_TABLET | Freq: Once | ORAL | Status: AC
  Administered 2020-01-09: 5 mg via ORAL
  Filled 2020-01-09: qty 1

## 2020-01-09 MED ORDER — METOPROLOL SUCCINATE ER 100 MG PO TB24
100.0000 mg | ORAL_TABLET | Freq: Every day | ORAL | Status: DC
  Administered 2020-01-10 – 2020-01-12 (×3): 100 mg via ORAL
  Filled 2020-01-09: qty 2
  Filled 2020-01-09 (×2): qty 1

## 2020-01-09 MED ORDER — METOPROLOL SUCCINATE ER 50 MG PO TB24
50.0000 mg | ORAL_TABLET | Freq: Once | ORAL | Status: AC
  Administered 2020-01-09: 50 mg via ORAL
  Filled 2020-01-09: qty 1

## 2020-01-09 MED ORDER — ENOXAPARIN SODIUM 300 MG/3ML IJ SOLN
1.0000 mg/kg | Freq: Two times a day (BID) | INTRAMUSCULAR | Status: DC
  Administered 2020-01-09 – 2020-01-11 (×4): 200 mg via SUBCUTANEOUS
  Filled 2020-01-09 (×6): qty 2

## 2020-01-09 MED ORDER — CALCIUM CARBONATE ANTACID 500 MG PO CHEW
400.0000 mg | CHEWABLE_TABLET | Freq: Every day | ORAL | Status: AC
  Administered 2020-01-09: 400 mg via ORAL
  Filled 2020-01-09: qty 2

## 2020-01-09 MED ORDER — LEVOTHYROXINE SODIUM 50 MCG PO TABS
75.0000 ug | ORAL_TABLET | Freq: Every day | ORAL | Status: DC
  Administered 2020-01-10 – 2020-01-12 (×3): 75 ug via ORAL
  Filled 2020-01-09 (×3): qty 2

## 2020-01-09 MED ORDER — POTASSIUM CHLORIDE 10 MEQ/100ML IV SOLN
10.0000 meq | INTRAVENOUS | Status: AC
  Administered 2020-01-09 (×4): 10 meq via INTRAVENOUS
  Filled 2020-01-09 (×4): qty 100

## 2020-01-09 MED ORDER — HEPARIN BOLUS VIA INFUSION
1500.0000 [IU] | Freq: Once | INTRAVENOUS | Status: AC
  Administered 2020-01-09: 1500 [IU] via INTRAVENOUS
  Filled 2020-01-09: qty 1500

## 2020-01-09 MED ORDER — CEPHALEXIN 500 MG PO CAPS
500.0000 mg | ORAL_CAPSULE | Freq: Three times a day (TID) | ORAL | Status: DC
  Administered 2020-01-09 – 2020-01-12 (×9): 500 mg via ORAL
  Filled 2020-01-09 (×11): qty 1

## 2020-01-09 NOTE — Consult Note (Addendum)
ANTICOAGULATION CONSULT NOTE  Pharmacy Consult for warfarin w/ Lovenox bridge Indication: VTE prophylaxis (h/o DVT on warfarin PTA)  Patient Measurements: Heparin Dosing Weight: 119 kg  Labs: Recent Labs    01/07/20 1536 01/07/20 1536 01/07/20 1955 01/07/20 2147 01/07/20 2332 01/08/20 0654 01/08/20 0654 01/08/20 1624 01/08/20 2358 01/09/20 0906  HGB 12.8*   < >  --   --   --  11.5*  --   --   --  11.8*  HCT 39.9  --   --   --   --  35.0*  --   --   --  36.0*  PLT 282  --   --   --   --  146*  --   --   --  151  APTT  --   --   --   --  144*  --   --   --   --   --   LABPROT  --   --   --  14.4 15.9*  --   --   --   --   --   INR  --   --   --  1.2 1.3*  --   --   --   --   --   HEPARINUNFRC  --   --   --   --   --  <0.10*   < > 0.40 0.29* 0.26*  CREATININE 0.86  --   --   --   --   --   --   --   --  0.83  TROPONINIHS 77*  --  90*  --   --   --   --   --   --   --    < > = values in this interval not displayed.    Estimated Creatinine Clearance: 162.8 mL/min (by C-G formula based on SCr of 0.83 mg/dL).   Medical History: Past Medical History:  Diagnosis Date  . Allergy   . Anxiety   . Arthritis   . Asthma   . Brain damage   . Chronic pain of both knees 07/13/2018  . Clotting disorder (HCC)   . COPD (chronic obstructive pulmonary disease) (HCC)   . Depression   . GERD (gastroesophageal reflux disease)   . HFrEF (heart failure with reduced ejection fraction) (HCC)    a. 03/2018 Echo: EF 25-30%, diff HK. Mod LAE.  [name] History of DVT (deep vein thrombosis)   . History of pulmonary embolism    a. Chronic coumadin.  [name] Hypertension   . MI (myocardial infarction) (HCC)   . Morbid obesity (HCC)   . Neck pain 07/13/2018  . NICM (nonischemic cardiomyopathy) (HCC)    a. s/p Cath x 3 - reportedly nl cors. Last cath 2019 in [place]; b. a. 03/2018 Echo: EF 25-30%, diff HK.  [name] Persistent atrial fibrillation (HCC)    a. 03/2018 s/p DCCV; b. CHA2DS2VASc = 1-->Xarelto (later changed to  warfarin); c. 05/2018 recurrent afib-->Amio initiated.  . Sleep apnea   . Sleep apnea     Medications:  Warfarin 5 mg daily prior to admission  Per RN, patient is un-sure of how he takes warfarin at home but does report compliance  Assessment: Patient is a 59 y/o M with medical history as above who presented to the ED 11/22 with shortness of breath. Troponin 77 >> 90. Pharmacy consulted to initiate heparin infusion for ACS.  Per discussion with MD, will restart warfarin and bridge  with Lovenox.  Date INR Dose 11/22 1.3 --  Goal of Therapy:  Heparin level 0.3-0.7 units/ml Monitor platelets by anticoagulation protocol: Yes   Plan:  Will discontinue heparin and start Lovenox 1 mg/kg (200 mg) BID at 1800 for bridging. Warfarin 10 mg PO x 1 today. Follow up INR with morning labs.  Pricilla Riffle, PharmD 01/09/2020,11:26 AM

## 2020-01-09 NOTE — Progress Notes (Signed)
[facility] Liaison note: Patient has a pending  referral for [employer] out patient Palliative services. TOC Ginnie Russoli made aware. Will follow for disposition. Dayna Barker BSN, RN, [facility] [employer] [phone]

## 2020-01-09 NOTE — Consult Note (Addendum)
PHARMACY CONSULT NOTE - FOLLOW UP  Pharmacy Consult for Electrolyte Monitoring and Replacement   Recent Labs:  Potassium (mmol/L)  Date Value  01/09/2020 3.0 (L)   Magnesium (mg/dL)  Date Value  46/28/6381 1.2 (L)   Calcium (mg/dL)  Date Value  77/12/6577 7.5 (L)   Albumin (g/dL)  Date Value  03/83/3383 3.2 (L)  05/02/2019 3.8   Phosphorus (mg/dL)  Date Value  29/19/1660 2.0 (L)   Sodium (mmol/L)  Date Value  01/09/2020 131 (L)  06/06/2019 139     Assessment: Pt is 59yo M who presented initially with sepsis secondary to Lt lower limb cellulitis complicated by acute on chronic respiratory failure and Afib with RVR. On admission electrolytes were WNL. Pharmacy now consulted to manage electrolytes.  Labs Na K Ca Mg Phos Alb 11/22 137 4.1 7.8 -- -- 3.4 (corrected Ca - 8.3) 11/23   --  --  --  -- -- --  11/24 131 3.0 7.5 1.2 -- 3.2 (corrected Ca - 8.1)  Goal of Therapy:  WNL  Plan:  Will replete with KCL IV x4 doses. Will replete with Mag Sulf 4g IV x1 dose. Will give some Calcium PO 400mg  elemental x1 dose.   . [name], PharmD Clinical Pharmacist 01/09/2020 1:48 PM

## 2020-01-09 NOTE — Consult Note (Signed)
ANTICOAGULATION CONSULT NOTE  Pharmacy Consult for Heparin Infusion Indication: chest pain/ACS / Afib / VTE prophylaxis  Patient Measurements: Heparin Dosing Weight: 119 kg  Labs: Recent Labs    01/07/20 1536 01/07/20 1955 01/07/20 2147 01/07/20 2332 01/08/20 0654 01/08/20 1624 01/08/20 2358  HGB 12.8*  --   --   --  11.5*  --   --   HCT 39.9  --   --   --  35.0*  --   --   PLT 282  --   --   --  146*  --   --   APTT  --   --   --  144*  --   --   --   LABPROT  --   --  14.4 15.9*  --   --   --   INR  --   --  1.2 1.3*  --   --   --   HEPARINUNFRC  --   --   --   --  <0.10* 0.40 0.29*  CREATININE 0.86  --   --   --   --   --   --   TROPONINIHS 77* 90*  --   --   --   --   --     Estimated Creatinine Clearance: 157.1 mL/min (by C-G formula based on SCr of 0.86 mg/dL).   Medical History: Past Medical History:  Diagnosis Date  . Allergy   . Anxiety   . Arthritis   . Asthma   . Brain damage   . Chronic pain of both knees 07/13/2018  . Clotting disorder (HCC)   . COPD (chronic obstructive pulmonary disease) (HCC)   . Depression   . GERD (gastroesophageal reflux disease)   . HFrEF (heart failure with reduced ejection fraction) (HCC)    a. 03/2018 Echo: EF 25-30%, diff HK. Mod LAE.  [name] History of DVT (deep vein thrombosis)   . History of pulmonary embolism    a. Chronic coumadin.  [name] Hypertension   . MI (myocardial infarction) (HCC)   . Morbid obesity (HCC)   . Neck pain 07/13/2018  . NICM (nonischemic cardiomyopathy) (HCC)    a. s/p Cath x 3 - reportedly nl cors. Last cath 2019 in [place]; b. a. 03/2018 Echo: EF 25-30%, diff HK.  [name] Persistent atrial fibrillation (HCC)    a. 03/2018 s/p DCCV; b. CHA2DS2VASc = 1-->Xarelto (later changed to warfarin); c. 05/2018 recurrent afib-->Amio initiated.  . Sleep apnea   . Sleep apnea     Medications:  Warfarin 5 mg daily prior to admission  Per RN, patient is un-sure of how he takes warfarin at home but does report  compliance  Assessment: Patient is a 59 y/o M with medical history as above who presented to the ED 11/22 with shortness of breath. Troponin 77 >> 90. Pharmacy has been consulted to initiate heparin infusion for ACS. There was also concern for left lower leg DVT and ultrasound was negative.  Baseline INR 1.2, aPTT 144 (drawn after initiation of infusion). Baseline CBC with Hgb 12.8, platelets 282.   Hgb down to 11.5 and platelets decreased to 146 today  Goal of Therapy:  Heparin level 0.3-0.7 units/ml Monitor platelets by anticoagulation protocol: Yes   Plan:  --11/23 at 2358 HL = 0.29, subtherapeutic. Will increase heparin infusion to current rate of 2100 units/hr --will recheck HL ~ 6 hours after rate increased --Daily CBC per protocol. Hgb and platelets decreased today. Monitor closely. --Continue  to follow clinical course and re-initiation of warfarin when appropriate  Valrie Hart A 01/09/2020,1:08 AM

## 2020-01-09 NOTE — Progress Notes (Signed)
[name]    SUBJECTIVE: Patient states to still be short of breath not much in with chest Is 1 significant leg edema dyspnea generalized weakness and fatigue denies any fever still has significant palpitations tachycardia patient states he can feel it  And feels generally needed to go back and get  Anxious. somewhat pleasant   Vitals:   01/09/20 1100 01/09/20 1200 01/09/20 1300 01/09/20 1400  BP:  (!) 85/42  99/82  Pulse: (!) 150 (!) 125 (!) 154 (!) 125  Resp: 18 16 (!) 25 (!) 27  Temp:      TempSrc:      SpO2: 100% 98% 97% 92%  Weight:      Height:         Intake/Output Summary (Last 24 hours) at 01/09/2020 1547 Last data filed at 01/09/2020 0840 Gross per 24 hour  Intake 2475.47 ml  Output 125 ml  Net 2350.47 ml      PHYSICAL EXAM  General: Well developed, well nourished, in no acute distress HEENT:  Normocephalic and atramatic Neck:  No JVD.  Lungs: Clear bilaterally to auscultation and percussion. Heart: HRRR . Normal S1 and S2 without gallops or murmurs.  Abdomen: Bowel sounds are positive, abdomen soft and non-tender  Msk:  Back normal, normal gait. Normal strength and tone for age. Extremities: No clubbing, cyanosis or edema.   Neuro: Alert and oriented X 3. Psych:  Good affect, responds appropriately   LABS: Basic Metabolic Panel: Recent Labs    01/07/20 1536 01/09/20 0906  NA 137 131*  K 4.1 3.0*  CL 98 94*  CO2 24 25  GLUCOSE 159* 170*  BUN 12 7  CREATININE 0.86 0.83  CALCIUM 7.8* 7.5*  MG  --  1.2*   Liver Function Tests: Recent Labs    01/07/20 1536 01/09/20 0906  AST 45* 43*  ALT 22 22  ALKPHOS 73 74  BILITOT 0.8 1.2  PROT 6.8 6.4*  ALBUMIN 3.4* 3.2*   No results for input(s): LIPASE, AMYLASE in the last 72 hours. CBC: Recent Labs    01/07/20 1536 01/07/20 1536 01/08/20 0654 01/09/20 0906  WBC 8.5   < > 6.8 7.6  NEUTROABS 5.8  --   --  5.6  HGB 12.8*   < > 11.5* 11.8*  HCT 39.9   < > 35.0* 36.0*  MCV 91.5   < >  90.2 90.9  PLT 282   < > 146* 151   < > = values in this interval not displayed.   Cardiac Enzymes: No results for input(s): CKTOTAL, CKMB, CKMBINDEX, TROPONINI in the last 72 hours. BNP: Invalid input(s): POCBNP D-Dimer: No results for input(s): DDIMER in the last 72 hours. Hemoglobin A1C: No results for input(s): HGBA1C in the last 72 hours. Fasting Lipid Panel: No results for input(s): CHOL, HDL, LDLCALC, TRIG, CHOLHDL, LDLDIRECT in the last 72 hours. Thyroid Function Tests: Recent Labs    01/08/20 2358  TSH 10.611*   Anemia Panel: No results for input(s): VITAMINB12, FOLATE, FERRITIN, TIBC, IRON, RETICCTPCT in the last 72 hours.  US Venous Img Lower Bilateral (DVT)  Result Date: 01/08/2020 CLINICAL DATA:  Bilateral lower extremity pain and edema. Evaluate for DVT. EXAM: BILATERAL LOWER EXTREMITY VENOUS DOPPLER ULTRASOUND TECHNIQUE: Gray-scale sonography with graded compression, as well as color Doppler and duplex ultrasound were performed to evaluate the lower extremity deep venous systems from the level of the common femoral vein and including the common femoral, femoral, profunda femoral, popliteal and calf veins  including the posterior tibial, peroneal and gastrocnemius veins when visible. The superficial great saphenous vein was also interrogated. Spectral Doppler was utilized to evaluate flow at rest and with distal augmentation maneuvers in the common femoral, femoral and popliteal veins. COMPARISON:  Bilateral lower extremity venous Doppler ultrasound-12/22/2019; 01/21/2019; 10/24/2018 (all negative); left lower extremity venous Doppler ultrasound-11/07/2019 (negative FINDINGS: Examination is degraded due to patient body habitus and poor sonographic window RIGHT LOWER EXTREMITY Common Femoral Vein: No evidence of thrombus. Normal compressibility, respiratory phasicity and response to augmentation. Saphenofemoral Junction: No evidence of thrombus. Normal compressibility and flow  on color Doppler imaging. Profunda Femoral Vein: No evidence of thrombus. Normal compressibility and flow on color Doppler imaging. Femoral Vein: No evidence of thrombus. Normal compressibility, respiratory phasicity and response to augmentation. Popliteal Vein: No evidence of thrombus. Normal compressibility, respiratory phasicity and response to augmentation. Calf Veins: Appear patent where visualized. Superficial Great Saphenous Vein: No evidence of thrombus. Normal compressibility. Venous Reflux:  None. Other Findings:  None. LEFT LOWER EXTREMITY Common Femoral Vein: No evidence of thrombus. Normal compressibility, respiratory phasicity and response to augmentation. Saphenofemoral Junction: No evidence of thrombus. Normal compressibility and flow on color Doppler imaging. Profunda Femoral Vein: No evidence of thrombus. Normal compressibility and flow on color Doppler imaging. Femoral Vein: No evidence of thrombus. Normal compressibility, respiratory phasicity and response to augmentation. Popliteal Vein: No evidence of thrombus. Normal compressibility, respiratory phasicity and response to augmentation. Calf Veins: Appear patent where visualized. Superficial Great Saphenous Vein: No evidence of thrombus. Normal compressibility. Venous Reflux:  None. Other Findings:  None. IMPRESSION: No evidence of DVT within either lower extremity. Note, this is the 5th negative lower extremity venous Doppler ultrasound since 10/24/2018. Electronically Signed   By: Simonne Come M.D.   On: 01/08/2020 07:49   DG Chest Portable 1 View  Result Date: 01/07/2020 CLINICAL DATA:  Respiratory distress, COPD exacerbation, asthma, hypertension EXAM: PORTABLE CHEST 1 VIEW COMPARISON:  Portable exam 1545 hours compared to 12/22/2019 FINDINGS: Enlargement of cardiac silhouette. Mediastinal contours and pulmonary vascularity normal. Lungs grossly clear. No definite infiltrate, pleural effusion, or pneumothorax. Osseous structures  unremarkable. IMPRESSION: Enlargement of cardiac silhouette. No acute abnormalities. Electronically Signed   By: Ulyses Southward M.D.   On: 01/07/2020 16:00   US ABDOMEN LIMITED RUQ (LIVER/GB)  Result Date: 01/08/2020 CLINICAL DATA:  Right upper quadrant pain EXAM: ULTRASOUND ABDOMEN LIMITED RIGHT UPPER QUADRANT COMPARISON:  None. FINDINGS: Gallbladder: No gallstones or wall thickening visualized. No sonographic Murphy sign noted by sonographer. Common bile duct: Diameter: Normal caliber, 5 mm Liver: Increased echotexture compatible with fatty infiltration. 3.5 cm simple appearing cyst in the right hepatic lobe. Portal vein is patent on color Doppler imaging with normal direction of blood flow towards the liver. Other: None. IMPRESSION: Hepatic steatosis. Electronically Signed   By: Charlett Nose M.D.   On: 01/08/2020 10:44     Echo normal left ventricular function EF of 60% with diastolic dysfunction  TELEMETRY: Well rapid atrial fibrillation rate of 140  ASSESSMENT AND PLAN:  Principal Problem:   Severe sepsis with septic shock (HCC) Active Problems:   Acute on chronic respiratory failure with hypoxia (HCC)   Chronic pain syndrome   Chest pain   CAD (coronary artery disease)   Cellulitis of right lower extremity   Hypothyroidism   Chronic anticoagulation (warfarin  COUMADIN)   Depression   GAD (generalized anxiety disorder)   Atrial fibrillation, chronic (HCC)   Class 3 obesity with alveolar hypoventilation, serious comorbidity,  and body mass index (BMI) of 60.0 to 69.9 in adult [facility])    Plan Sepsis continue broad-spectrum antibiotic therapy for cellulitis Acute on chronic hypoxic respiratory failure consider CPAP BiPAP supplemental oxygen Morbid obesity with prolonged significant weight loss exercise portion control Atrial fibrillation rapid ventricular response will increase rate control medication Continue anticoagulation with warfarin for atrial fibrillation DVT  prophylaxis COPD emphysema probably obstructive sleep apnea Hypertension continue lisinopril metoprolol  Continue diuretic therapy with torsemide  We will add amiodarone IV increase metoprolol consider adding digoxin    Alwyn Pea, MD 01/09/2020 3:47 PM

## 2020-01-09 NOTE — Progress Notes (Signed)
[facility]  - [place] [facility]   PATIENT NAME: [name] [name]    MR#:  [id number]  DATE OF BIRTH:  07-06-1960  SUBJECTIVE:  patient came in with increasing shortness of breath and dizziness and leg swelling. He was admitted with sepsis.  Currently having liquidy stool for last few days. Was on antibiotic for respiratory infection recently in November. Lives at home by himself. Appears to have gained a lot of weight.  REVIEW OF SYSTEMS:   Review of Systems  Constitutional: Negative for chills, fever and weight loss.  HENT: Negative for ear discharge, ear pain and nosebleeds.   Eyes: Negative for blurred vision, pain and discharge.  Respiratory: Positive for shortness of breath. Negative for sputum production, wheezing and stridor.   Cardiovascular: Positive for orthopnea, leg swelling and PND. Negative for chest pain and palpitations.  Gastrointestinal: Negative for abdominal pain, diarrhea, nausea and vomiting.  Genitourinary: Negative for frequency and urgency.  Musculoskeletal: Negative for back pain and joint pain.  Neurological: Positive for dizziness. Negative for sensory change, speech change, focal weakness and weakness.  Psychiatric/Behavioral: Negative for depression and hallucinations. The patient is not nervous/anxious.    Tolerating Diet:yes Tolerating PT:   DRUG ALLERGIES:  No Known Allergies  VITALS:  Blood pressure 101/81, pulse (!) 156, temperature 98.2 F (36.8 C), temperature source Axillary, resp. rate 15, height 5\' 8"  (1.727 m), weight (!) 197.6 kg, SpO2 98 %.  PHYSICAL EXAMINATION:   Physical Exam  GENERAL:  59 y.o.-year-old patient lying in the bed with no acute distress. Severe morbid obesity, anasarca HEENT: Head atraumatic, normocephalic. Oropharynx and nasopharynx clear.   LUNGS: distant breath sounds bilaterally, no wheezing, rales, rhonchi. No use of accessory muscles of respiration. No respiratory  distress CARDIOVASCULAR: S1, S2 normal. No murmurs, rubs, or gallops.  ABDOMEN: Soft, nontender, nondistended. Bowel sounds present. No organomegaly or mass.  EXTREMITIES: +++ edema upto the thigh NEUROLOGIC: Cranial nerves II through XII are intact. No focal Motor or sensory deficits b/l.   PSYCHIATRIC:  patient is alert and oriented x 3.  SKIN:  Pressure Injury 06/25/19 Buttocks Right Stage 2 -  Partial thickness loss of dermis presenting as a shallow open injury with a red, pink wound bed without slough. pink (Active)  06/25/19 1453  Location: Buttocks  Location Orientation: Right  Staging: Stage 2 -  Partial thickness loss of dermis presenting as a shallow open injury with a red, pink wound bed without slough.  Wound Description (Comments): pink  Present on Admission:        LABORATORY PANEL:  CBC Recent Labs  Lab 01/09/20 0906  WBC 7.6  HGB 11.8*  HCT 36.0*  PLT 151    Chemistries  Recent Labs  Lab 01/09/20 0906  NA 131*  K 3.0*  CL 94*  CO2 25  GLUCOSE 170*  BUN 7  CREATININE 0.83  CALCIUM 7.5*  MG 1.2*  AST 43*  ALT 22  ALKPHOS 74  BILITOT 1.2   Cardiac Enzymes No results for input(s): TROPONINI in the last 168 hours. RADIOLOGY:  01/11/20 Venous Img Lower Bilateral (DVT)  Result Date: 01/08/2020 CLINICAL DATA:  Bilateral lower extremity pain and edema. Evaluate for DVT. EXAM: BILATERAL LOWER EXTREMITY VENOUS DOPPLER ULTRASOUND TECHNIQUE: Gray-scale sonography with graded compression, as well as color Doppler and duplex ultrasound were performed to evaluate the lower extremity deep venous systems from the level of the common femoral vein and including the common femoral, femoral, profunda femoral, popliteal and calf  veins including the posterior tibial, peroneal and gastrocnemius veins when visible. The superficial great saphenous vein was also interrogated. Spectral Doppler was utilized to evaluate flow at rest and with distal augmentation maneuvers in the  common femoral, femoral and popliteal veins. COMPARISON:  Bilateral lower extremity venous Doppler ultrasound-12/22/2019; 01/21/2019; 10/24/2018 (all negative); left lower extremity venous Doppler ultrasound-11/07/2019 (negative FINDINGS: Examination is degraded due to patient body habitus and poor sonographic window RIGHT LOWER EXTREMITY Common Femoral Vein: No evidence of thrombus. Normal compressibility, respiratory phasicity and response to augmentation. Saphenofemoral Junction: No evidence of thrombus. Normal compressibility and flow on color Doppler imaging. Profunda Femoral Vein: No evidence of thrombus. Normal compressibility and flow on color Doppler imaging. Femoral Vein: No evidence of thrombus. Normal compressibility, respiratory phasicity and response to augmentation. Popliteal Vein: No evidence of thrombus. Normal compressibility, respiratory phasicity and response to augmentation. Calf Veins: Appear patent where visualized. Superficial Great Saphenous Vein: No evidence of thrombus. Normal compressibility. Venous Reflux:  None. Other Findings:  None. LEFT LOWER EXTREMITY Common Femoral Vein: No evidence of thrombus. Normal compressibility, respiratory phasicity and response to augmentation. Saphenofemoral Junction: No evidence of thrombus. Normal compressibility and flow on color Doppler imaging. Profunda Femoral Vein: No evidence of thrombus. Normal compressibility and flow on color Doppler imaging. Femoral Vein: No evidence of thrombus. Normal compressibility, respiratory phasicity and response to augmentation. Popliteal Vein: No evidence of thrombus. Normal compressibility, respiratory phasicity and response to augmentation. Calf Veins: Appear patent where visualized. Superficial Great Saphenous Vein: No evidence of thrombus. Normal compressibility. Venous Reflux:  None. Other Findings:  None. IMPRESSION: No evidence of DVT within either lower extremity. Note, this is the 5th negative lower  extremity venous Doppler ultrasound since 10/24/2018. Electronically Signed   By: Simonne Come M.D.   On: 01/08/2020 07:49   DG Chest Portable 1 View  Result Date: 01/07/2020 CLINICAL DATA:  Respiratory distress, COPD exacerbation, asthma, hypertension EXAM: PORTABLE CHEST 1 VIEW COMPARISON:  Portable exam 1545 hours compared to 12/22/2019 FINDINGS: Enlargement of cardiac silhouette. Mediastinal contours and pulmonary vascularity normal. Lungs grossly clear. No definite infiltrate, pleural effusion, or pneumothorax. Osseous structures unremarkable. IMPRESSION: Enlargement of cardiac silhouette. No acute abnormalities. Electronically Signed   By: Ulyses Southward M.D.   On: 01/07/2020 16:00   US ABDOMEN LIMITED RUQ (LIVER/GB)  Result Date: 01/08/2020 CLINICAL DATA:  Right upper quadrant pain EXAM: ULTRASOUND ABDOMEN LIMITED RIGHT UPPER QUADRANT COMPARISON:  None. FINDINGS: Gallbladder: No gallstones or wall thickening visualized. No sonographic Murphy sign noted by sonographer. Common bile duct: Diameter: Normal caliber, 5 mm Liver: Increased echotexture compatible with fatty infiltration. 3.5 cm simple appearing cyst in the right hepatic lobe. Portal vein is patent on color Doppler imaging with normal direction of blood flow towards the liver. Other: None. IMPRESSION: Hepatic steatosis. Electronically Signed   By: Charlett Nose M.D.   On: 01/08/2020 10:44   ASSESSMENT AND PLAN:  [name] [name] is a 59 y.o. 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 He presented to the emergency department for chief concerns of acute onset shortness of breath at  01/07/2020  Sepsis POA etio likely mild cellulitis with ulcer right leg -sepsis ruled out -- lactic acid trending down -UA positive for trace leukocytes -Follow  blood cultures negative -Broad-spectrum antibiotic with IV ceftriaxone and Vanco-- de-escalate to oral Keflex -- follow-up wound RN recommendations -- patient afebrile, white count normal, hemodynamically stable at present  Left lower leg swelling-differential includes cellulitis  -Ultrasound of lower extremity negative for DVT -- to oral Keflex  Acute on chronic respiratory failure-suspect acute on chronic diastolic heart failure with severe Anasarca suspected severe sleep apnea  -Patient requiring BiPAP-- now on nasal cannula oxygen -- IV Lasix 40 mg BID --Metolazone 5 mg x1   Elevated troponin-and chronic A. fib with RVR -Positive delta in setting of chest pain -Sublingual nitroglycerin 0.4 mg minimally reduces the pain -Cardiology Dr. Juliann Pares has been consulted and he will follow -Heparin GTT started per cardiology--change to po warfarin -INR 1.3--bridge with SQ lovenox for now  Hyperlipidemia-atorvastatin 40 mg daily  hypertension-lisinopril 2.5 mg daily, metoprolol succinate 50 mg daily  Obstructive sleep apnea-currently on BiPAP, normally wears CPAP at night  History of gout-continue allopurinol 100 mg daily  Depression/anxiety-duloxetine 20 mg daily  Hypothyroid levothyroxine 50 mcg daily -- TSH 10.1--- increase levothyroxine to 75 g daily  patient has multiple comorbidities including morbidly obese. He is at a high risk for cardiorespiratory arrest. He is at a high risk for readmission.  Transfer to MedSurg  DVT prophylaxis: On Coumadin Code Status: Full code Diet: Heart healthy/carb modified Family Communication: No Disposition Plan:  home in 2 to 3 days Consults called: Cardiology Dr. Juliann Pares Admission status: Inpatient Procedures: Status is: Inpatient  Remains inpatient  appropriate because:Ongoing active pain requiring inpatient pain management and Inpatient level of care appropriate due to severity of illness   Dispo: The patient is from: Home              Anticipated d/c is to: Home              Anticipated d/c date is: 3 days              Patient currently is not medically stable to d/c.        TOTAL TIME TAKING CARE OF THIS PATIENT: 25 minutes.  >50% time spent on counselling and coordination of care  Note: This dictation was prepared with Dragon dictation along with smaller phrase technology. Any transcriptional errors that result from this process are unintentional.  Enedina Finner M.D    [facility]   CC: Primary care physician; [name], DOPatient ID: [name] [name], male   DOB: 07/21/1960, 59 y.o.   MRN: [id number]

## 2020-01-10 ENCOUNTER — Other Ambulatory Visit: Payer: Self-pay

## 2020-01-10 DIAGNOSIS — R6521 Severe sepsis with septic shock: Secondary | ICD-10-CM | POA: Diagnosis not present

## 2020-01-10 DIAGNOSIS — I2510 Atherosclerotic heart disease of native coronary artery without angina pectoris: Secondary | ICD-10-CM | POA: Diagnosis not present

## 2020-01-10 DIAGNOSIS — R0602 Shortness of breath: Secondary | ICD-10-CM | POA: Diagnosis not present

## 2020-01-10 DIAGNOSIS — I5023 Acute on chronic systolic (congestive) heart failure: Secondary | ICD-10-CM | POA: Diagnosis not present

## 2020-01-10 DIAGNOSIS — I4891 Unspecified atrial fibrillation: Secondary | ICD-10-CM | POA: Diagnosis not present

## 2020-01-10 DIAGNOSIS — J9621 Acute and chronic respiratory failure with hypoxia: Secondary | ICD-10-CM | POA: Diagnosis not present

## 2020-01-10 DIAGNOSIS — L03115 Cellulitis of right lower limb: Secondary | ICD-10-CM | POA: Diagnosis not present

## 2020-01-10 DIAGNOSIS — I482 Chronic atrial fibrillation, unspecified: Secondary | ICD-10-CM | POA: Diagnosis not present

## 2020-01-10 DIAGNOSIS — A419 Sepsis, unspecified organism: Secondary | ICD-10-CM | POA: Diagnosis not present

## 2020-01-10 LAB — BASIC METABOLIC PANEL
Anion gap: 12 (ref 5–15)
BUN: 8 mg/dL (ref 6–20)
CO2: 27 mmol/L (ref 22–32)
Calcium: 7.9 mg/dL — ABNORMAL LOW (ref 8.9–10.3)
Chloride: 90 mmol/L — ABNORMAL LOW (ref 98–111)
Creatinine, Ser: 0.96 mg/dL (ref 0.61–1.24)
GFR, Estimated: >60 mL/min (ref >60)
Glucose, Bld: 127 mg/dL — ABNORMAL HIGH (ref 70–99)
Potassium: 3.6 mmol/L (ref 3.5–5.1)
Sodium: 129 mmol/L — ABNORMAL LOW (ref 135–145)

## 2020-01-10 LAB — PROTIME-INR
INR: 1.2 (ref 0.8–1.2)
Prothrombin Time: 14.9 seconds (ref 11.4–15.2)

## 2020-01-10 LAB — MAGNESIUM: Magnesium: 1.7 mg/dL (ref 1.7–2.4)

## 2020-01-10 LAB — PHOSPHORUS: Phosphorus: 1.8 mg/dL — ABNORMAL LOW (ref 2.5–4.6)

## 2020-01-10 MED ORDER — MAGNESIUM SULFATE 2 GM/50ML IV SOLN
2.0000 g | Freq: Once | INTRAVENOUS | Status: AC
  Administered 2020-01-10: 2 g via INTRAVENOUS
  Filled 2020-01-10: qty 50

## 2020-01-10 MED ORDER — POTASSIUM CHLORIDE CRYS ER 20 MEQ PO TBCR
40.0000 meq | EXTENDED_RELEASE_TABLET | Freq: Once | ORAL | Status: AC
  Administered 2020-01-10: 40 meq via ORAL
  Filled 2020-01-10: qty 2

## 2020-01-10 MED ORDER — AMIODARONE HCL 200 MG PO TABS
200.0000 mg | ORAL_TABLET | Freq: Two times a day (BID) | ORAL | Status: DC
  Administered 2020-01-10 – 2020-01-12 (×5): 200 mg via ORAL
  Filled 2020-01-10 (×5): qty 1

## 2020-01-10 MED ORDER — WARFARIN SODIUM 10 MG PO TABS
10.0000 mg | ORAL_TABLET | Freq: Once | ORAL | Status: AC
  Administered 2020-01-10: 10 mg via ORAL
  Filled 2020-01-10: qty 1

## 2020-01-10 MED ORDER — K PHOS MONO-SOD PHOS DI & MONO 155-852-130 MG PO TABS
500.0000 mg | ORAL_TABLET | ORAL | Status: AC
  Administered 2020-01-10 (×2): 500 mg via ORAL
  Filled 2020-01-10 (×2): qty 2

## 2020-01-10 NOTE — Consult Note (Signed)
PHARMACY CONSULT NOTE - FOLLOW UP  Pharmacy Consult for Electrolyte Monitoring and Replacement   Recent Labs:  Potassium (mmol/L)  Date Value  01/10/2020 3.6   Magnesium (mg/dL)  Date Value  08/11/9483 1.7   Calcium (mg/dL)  Date Value  46/27/0350 7.9 (L)   Albumin (g/dL)  Date Value  09/38/1829 3.2 (L)  05/02/2019 3.8   Phosphorus (mg/dL)  Date Value  93/71/6967 1.8 (L)   Sodium (mmol/L)  Date Value  01/10/2020 129 (L)  06/06/2019 139     Assessment: Pt is 59yo M who presented initially with sepsis secondary to Lt lower limb cellulitis complicated by acute on chronic respiratory failure and Afib with RVR. On admission electrolytes were WNL. Pharmacy now consulted to manage electrolytes. On amio and lasix 40 mg IV BID. Lisinopril d/c'ed.   Goal of Therapy:  WNL  Plan:  KCl 40 mEq x 1 PO Mg 2 g IV x 1  KPhos 2 tabs x 2   F/u with Am labs.    Paschal Dopp, PharmD Clinical Pharmacist 01/10/2020 1:58 PM

## 2020-01-10 NOTE — Progress Notes (Signed)
[facility]  - [place] [facility]   PATIENT NAME: [name] [name]    MR#:  [id number]  DATE OF BIRTH:  1961-01-25  SUBJECTIVE:  patient came in with increasing shortness of breath and dizziness and leg swelling. He was admitted with sepsis.  Lives at home by himself. Appears to have gained a lot of weight. Out in the chair--still sob  REVIEW OF SYSTEMS:   Review of Systems  Constitutional: Negative for chills, fever and weight loss.  HENT: Negative for ear discharge, ear pain and nosebleeds.   Eyes: Negative for blurred vision, pain and discharge.  Respiratory: Positive for shortness of breath. Negative for sputum production, wheezing and stridor.   Cardiovascular: Positive for leg swelling and PND. Negative for chest pain and palpitations.  Gastrointestinal: Negative for abdominal pain, diarrhea, nausea and vomiting.  Genitourinary: Negative for frequency and urgency.  Musculoskeletal: Negative for back pain and joint pain.  Neurological: Negative for sensory change, speech change, focal weakness and weakness.  Psychiatric/Behavioral: Negative for depression and hallucinations. The patient is not nervous/anxious.    Tolerating Diet:yes Tolerating PT:   DRUG ALLERGIES:  No Known Allergies  VITALS:  Blood pressure 115/85, pulse (!) 34, temperature 98 F (36.7 C), temperature source Oral, resp. rate 19, height 5\' 8"  (1.727 m), weight (!) 197.6 kg, SpO2 100 %.  PHYSICAL EXAMINATION:   Physical Exam  GENERAL:  59 y.o.-year-old patient lying in the bed with no acute distress. Severe morbid obesity, anasarca HEENT: Head atraumatic, normocephalic. Oropharynx and nasopharynx clear.   LUNGS: distant breath sounds bilaterally, no wheezing, rales, rhonchi. No use of accessory muscles of respiration. No respiratory distress CARDIOVASCULAR: S1, S2 normal. No murmurs, rubs, or gallops.  ABDOMEN: Soft, nontender, nondistended. Bowel sounds present. No organomegaly or  mass.  EXTREMITIES: +++ edema upto the thigh NEUROLOGIC: Cranial nerves II through XII are intact. No focal Motor or sensory deficits b/l.   PSYCHIATRIC:  patient is alert and oriented x 3.  SKIN:  Pressure Injury 06/25/19 Buttocks Right Stage 2 -  Partial thickness loss of dermis presenting as a shallow open injury with a red, pink wound bed without slough. pink (Active)  06/25/19 1453  Location: Buttocks  Location Orientation: Right  Staging: Stage 2 -  Partial thickness loss of dermis presenting as a shallow open injury with a red, pink wound bed without slough.  Wound Description (Comments): pink  Present on Admission:    LABORATORY PANEL:  CBC Recent Labs  Lab 01/09/20 0906  WBC 7.6  HGB 11.8*  HCT 36.0*  PLT 151    Chemistries  Recent Labs  Lab 01/09/20 0906 01/09/20 0906 01/10/20 1324  NA 131*   < > 129*  K 3.0*   < > 3.6  CL 94*   < > 90*  CO2 25   < > 27  GLUCOSE 170*   < > 127*  BUN 7   < > 8  CREATININE 0.83   < > 0.96  CALCIUM 7.5*   < > 7.9*  MG 1.2*   < > 1.7  AST 43*  --   --   ALT 22  --   --   ALKPHOS 74  --   --   BILITOT 1.2  --   --    < > = values in this interval not displayed.   Cardiac Enzymes No results for input(s): TROPONINI in the last 168 hours. RADIOLOGY:  No results found. ASSESSMENT AND PLAN:  [name] [name] is a 59 y.o. 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 He presented to the emergency department for chief concerns of acute onset shortness of breath at 01/07/2020  Sepsis POA etio likely mild cellulitis with ulcer right leg -sepsis ruled out -lactic acid trending  down -UA positive for trace leukocytes - blood cultures negative -Broad-spectrum antibiotic with IV ceftriaxone and Vanco-- de-escalate to oral Keflex -- follow-up wound RN recommendations -- patient afebrile, white count normal, hemodynamically stable at present  Left lower leg swelling-differential includes cellulitis  -Ultrasound of lower extremity negative for DVT -- to oral Keflex  Acute on chronic respiratory failure-suspect acute on chronic diastolic heart failure with severe Anasarca suspected severe sleep apnea  -Patient requiring BiPAP-- now on nasal cannula oxygen -- IV Lasix 40 mg BID --Metolazone 5 mg x1  Elevated troponin-and chronic A. fib with RVR -Sublingual nitroglycerin 0.4 mg minimally reduces the pain -Cardiology Dr. Juliann Pares has been consulted and he will follow -Heparin GTT started per cardiology--change to po warfarin -INR 1.3--bridge with SQ lovenox for now -was started on IV amiodarone gtt --now changed to po amiodarone.  Hyperlipidemia-atorvastatin 40 mg daily  hypertension-lisinopril 2.5 mg daily, metoprolol succinate 50 mg daily  Obstructive sleep apnea-currently on BiPAP, normally wears CPAP at night  History of gout-continue allopurinol 100 mg daily  Depression/anxiety-duloxetine 20 mg daily  Hypothyroid levothyroxine 50 mcg daily -- TSH 10.1--- increase levothyroxine to 75 g daily  patient has multiple comorbidities including morbidly obese. He is at a high risk for cardiorespiratory arrest. He is at a high risk for readmission.  Transfer to MedSurg  DVT prophylaxis: On Coumadin Code Status: Full code Diet: Heart healthy/carb modified Family Communication: No Disposition Plan:  home in 2 to 3 days Consults called: Cardiology Dr. Juliann Pares Admission status: Inpatient Procedures: Status is: Inpatient  Remains inpatient appropriate because:Ongoing active pain requiring inpatient pain management and Inpatient level of care  appropriate due to severity of illness   Dispo: The patient is from: Home              Anticipated d/c is to: Home              Anticipated d/c date is: 3 days              Patient currently is not medically stable to d/c.   TOTAL TIME TAKING CARE OF THIS PATIENT: 25 minutes.  >50% time spent on counselling and coordination of care  Note: This dictation was prepared with Dragon dictation along with smaller phrase technology. Any transcriptional errors that result from this process are unintentional.  Enedina Finner M.D    [facility]   CC: Primary care physician; [name], DOPatient ID: [name] [name], male   DOB: 10-08-1960, 59 y.o.   MRN: [id number]

## 2020-01-10 NOTE — Progress Notes (Signed)
[facility] Cardiology    SUBJECTIVE: Patient still has shortness of breath may be slightly better than yesterday has had difficulty getting his blood today he is up eating denies any chest pain palpitations are improved   Vitals:   01/10/20 0400 01/10/20 0410 01/10/20 0500 01/10/20 0800  BP:  (!) 80/63    Pulse: 71 92 75   Resp: 11 15 12    Temp:    (P) 98 F (36.7 C)  TempSrc:    (P) Oral  SpO2: 100% 99% 97% (P) 100%  Weight:      Height:         Intake/Output Summary (Last 24 hours) at 01/10/2020 01/12/2020 Last data filed at 01/10/2020 0400 Gross per 24 hour  Intake 872.28 ml  Output 600 ml  Net 272.28 ml      PHYSICAL EXAM  General: Well developed, well nourished, in no acute distress HEENT:  Normocephalic and atramatic Neck:  No JVD.  Lungs: Clear bilaterally to auscultation and percussion. Heart: HRRR . Normal S1 and S2 without gallops or murmurs.  Abdomen: Bowel sounds are positive, abdomen soft and non-tender  Msk:  Back normal, normal gait. Normal strength and tone for age. Extremities: No clubbing, cyanosis or edema.   Neuro: Alert and oriented X 3. Psych:  Good affect, responds appropriately   LABS: Basic Metabolic Panel: Recent Labs    01/07/20 1536 01/09/20 0906  NA 137 131*  K 4.1 3.0*  CL 98 94*  CO2 24 25  GLUCOSE 159* 170*  BUN 12 7  CREATININE 0.86 0.83  CALCIUM 7.8* 7.5*  MG  --  1.2*   Liver Function Tests: Recent Labs    01/07/20 1536 01/09/20 0906  AST 45* 43*  ALT 22 22  ALKPHOS 73 74  BILITOT 0.8 1.2  PROT 6.8 6.4*  ALBUMIN 3.4* 3.2*   No results for input(s): LIPASE, AMYLASE in the last 72 hours. CBC: Recent Labs    01/07/20 1536 01/07/20 1536 01/08/20 0654 01/09/20 0906  WBC 8.5   < > 6.8 7.6  NEUTROABS 5.8  --   --  5.6  HGB 12.8*   < > 11.5* 11.8*  HCT 39.9   < > 35.0* 36.0*  MCV 91.5   < > 90.2 90.9  PLT 282   < > 146* 151   < > = values in this interval not displayed.   Cardiac Enzymes: No results for input(s):  CKTOTAL, CKMB, CKMBINDEX, TROPONINI in the last 72 hours. BNP: Invalid input(s): POCBNP D-Dimer: No results for input(s): DDIMER in the last 72 hours. Hemoglobin A1C: No results for input(s): HGBA1C in the last 72 hours. Fasting Lipid Panel: No results for input(s): CHOL, HDL, LDLCALC, TRIG, CHOLHDL, LDLDIRECT in the last 72 hours. Thyroid Function Tests: Recent Labs    01/08/20 2358  TSH 10.611*   Anemia Panel: No results for input(s): VITAMINB12, FOLATE, FERRITIN, TIBC, IRON, RETICCTPCT in the last 72 hours.  01/10/20 ABDOMEN LIMITED RUQ (LIVER/GB)  Result Date: 01/08/2020 CLINICAL DATA:  Right upper quadrant pain EXAM: ULTRASOUND ABDOMEN LIMITED RIGHT UPPER QUADRANT COMPARISON:  None. FINDINGS: Gallbladder: No gallstones or wall thickening visualized. No sonographic Murphy sign noted by sonographer. Common bile duct: Diameter: Normal caliber, 5 mm Liver: Increased echotexture compatible with fatty infiltration. 3.5 cm simple appearing cyst in the right hepatic lobe. Portal vein is patent on color Doppler imaging with normal direction of blood flow towards the liver. Other: None. IMPRESSION: Hepatic steatosis. Electronically Signed   By: 01/10/2020  [place] M.D.   On: 01/08/2020 10:44     Echo preserved left ventricular function of 60%  TELEMETRY: Rate at 90  ASSESSMENT AND PLAN:  Principal Problem:   Severe sepsis with septic shock (HCC) Active Problems:   Acute on chronic respiratory failure with hypoxia (HCC)   Chronic pain syndrome   Chest pain   CAD (coronary artery disease)   Cellulitis of right lower extremity   Hypothyroidism   Chronic anticoagulation (warfarin  COUMADIN)   Depression   GAD (generalized anxiety disorder)   Atrial fibrillation, chronic (HCC)   Class 3 obesity with alveolar hypoventilation, serious comorbidity, and body mass index (BMI) of 60.0 to 69.9 in adult [facility])    Plan Recommend discontinue IV amiodarone and switch to p.o. 200 mg twice a  day Continue metoprolol for rate Maintain warfarin level checks Inhalers as necessary for COPD respiratory failure Continue BiPAP CPAP as necessary for acute on chronic respiratory failure Supplemental oxygen for hypoxemia Broad-spectrum antibiotic therapy for sepsis probably related to cellulitis of the lower extremity Recommend dramatic weight loss as this is probably contributing to most of his underlying problems No invasive cardiac interventions necessary at this point   Alwyn Pea, MD 01/10/2020 8:52 AM

## 2020-01-10 NOTE — Consult Note (Signed)
ANTICOAGULATION CONSULT NOTE  Pharmacy Consult for warfarin w/ Lovenox bridge Indication: VTE prophylaxis (h/o DVT on warfarin PTA)  Patient Measurements: Heparin Dosing Weight: 119 kg  Labs: Recent Labs    01/07/20 1536 01/07/20 1536 01/07/20 1955 01/07/20 2147 01/07/20 2332 01/08/20 0654 01/08/20 0654 01/08/20 1624 01/08/20 2358 01/09/20 0906 01/10/20 1324  HGB 12.8*   < >  --   --   --  11.5*  --   --   --  11.8*  --   HCT 39.9  --   --   --   --  35.0*  --   --   --  36.0*  --   PLT 282  --   --   --   --  146*  --   --   --  151  --   APTT  --   --   --   --  144*  --   --   --   --   --   --   LABPROT  --   --   --  14.4 15.9*  --   --   --   --   --  14.9  INR  --   --   --  1.2 1.3*  --   --   --   --   --  1.2  HEPARINUNFRC  --   --   --   --   --  <0.10*   < > 0.40 0.29* 0.26*  --   CREATININE 0.86  --   --   --   --   --   --   --   --  0.83 0.96  TROPONINIHS 77*  --  90*  --   --   --   --   --   --   --   --    < > = values in this interval not displayed.    Estimated Creatinine Clearance: 140.7 mL/min (by C-G formula based on SCr of 0.96 mg/dL).   Medical History: Past Medical History:  Diagnosis Date  . Allergy   . Anxiety   . Arthritis   . Asthma   . Brain damage   . Chronic pain of both knees 07/13/2018  . Clotting disorder (HCC)   . COPD (chronic obstructive pulmonary disease) (HCC)   . Depression   . GERD (gastroesophageal reflux disease)   . HFrEF (heart failure with reduced ejection fraction) (HCC)    a. 03/2018 Echo: EF 25-30%, diff HK. Mod LAE.  [name] History of DVT (deep vein thrombosis)   . History of pulmonary embolism    a. Chronic coumadin.  [name] Hypertension   . MI (myocardial infarction) (HCC)   . Morbid obesity (HCC)   . Neck pain 07/13/2018  . NICM (nonischemic cardiomyopathy) (HCC)    a. s/p Cath x 3 - reportedly nl cors. Last cath 2019 in [place]; b. a. 03/2018 Echo: EF 25-30%, diff HK.  [name] Persistent atrial fibrillation (HCC)    a.  03/2018 s/p DCCV; b. CHA2DS2VASc = 1-->Xarelto (later changed to warfarin); c. 05/2018 recurrent afib-->Amio initiated.  . Sleep apnea   . Sleep apnea     Medications:  Warfarin 5 mg daily prior to admission  Per RN, patient is un-sure of how he takes warfarin at home but does report compliance  Assessment: Patient is a 59 y/o M with medical history as above who presented to the ED 11/22 with shortness of  breath. Troponin 77 >> 90. Pharmacy consulted to initiate heparin infusion for ACS. DDI: keflex, amiodarone.   Per discussion with MD, will restart warfarin and bridge with Lovenox.  Date INR Dose 11/22 1.3  11/24  10 mg 11/25 1.2 10 mg  Goal of Therapy:  Heparin level 0.3-0.7 units/ml Monitor platelets by anticoagulation protocol: Yes   Plan:  INR is subtherapeutic. Will continue Lovenox 1 mg/kg (200 mg) BID bridge and order warfarin 10 mg x 1. Daily INR ordered. CBC every 72 hours at least.   Ronnald Ramp, PharmD 01/10/2020,2:01 PM

## 2020-01-10 NOTE — Progress Notes (Signed)
Patient arrived from ICU by stretcher and was assisted by staff to the recliner per patients request

## 2020-01-11 DIAGNOSIS — L03115 Cellulitis of right lower limb: Secondary | ICD-10-CM | POA: Diagnosis not present

## 2020-01-11 DIAGNOSIS — J9621 Acute and chronic respiratory failure with hypoxia: Secondary | ICD-10-CM | POA: Diagnosis not present

## 2020-01-11 DIAGNOSIS — R6521 Severe sepsis with septic shock: Secondary | ICD-10-CM | POA: Diagnosis not present

## 2020-01-11 DIAGNOSIS — I2510 Atherosclerotic heart disease of native coronary artery without angina pectoris: Secondary | ICD-10-CM | POA: Diagnosis not present

## 2020-01-11 DIAGNOSIS — A419 Sepsis, unspecified organism: Secondary | ICD-10-CM | POA: Diagnosis not present

## 2020-01-11 DIAGNOSIS — I482 Chronic atrial fibrillation, unspecified: Secondary | ICD-10-CM | POA: Diagnosis not present

## 2020-01-11 LAB — BASIC METABOLIC PANEL
Anion gap: 10 (ref 5–15)
BUN: 8 mg/dL (ref 6–20)
CO2: 30 mmol/L (ref 22–32)
Calcium: 7.9 mg/dL — ABNORMAL LOW (ref 8.9–10.3)
Chloride: 91 mmol/L — ABNORMAL LOW (ref 98–111)
Creatinine, Ser: 0.81 mg/dL (ref 0.61–1.24)
GFR, Estimated: >60 mL/min (ref >60)
Glucose, Bld: 125 mg/dL — ABNORMAL HIGH (ref 70–99)
Potassium: 3.4 mmol/L — ABNORMAL LOW (ref 3.5–5.1)
Sodium: 131 mmol/L — ABNORMAL LOW (ref 135–145)

## 2020-01-11 LAB — HEPARIN ANTI-XA: Heparin LMW: 1.38 IU/mL

## 2020-01-11 LAB — PROTIME-INR
INR: 1.4 — ABNORMAL HIGH (ref 0.8–1.2)
Prothrombin Time: 16.3 seconds — ABNORMAL HIGH (ref 11.4–15.2)

## 2020-01-11 LAB — MAGNESIUM: Magnesium: 1.7 mg/dL (ref 1.7–2.4)

## 2020-01-11 LAB — PHOSPHORUS: Phosphorus: 1.8 mg/dL — ABNORMAL LOW (ref 2.5–4.6)

## 2020-01-11 MED ORDER — ENOXAPARIN SODIUM 300 MG/3ML IJ SOLN
0.8000 mg/kg | Freq: Two times a day (BID) | INTRAMUSCULAR | Status: DC
  Administered 2020-01-11 – 2020-01-12 (×2): 160 mg via SUBCUTANEOUS
  Filled 2020-01-11 (×3): qty 1.6

## 2020-01-11 MED ORDER — WARFARIN SODIUM 10 MG PO TABS
10.0000 mg | ORAL_TABLET | Freq: Once | ORAL | Status: AC
  Administered 2020-01-11: 10 mg via ORAL
  Filled 2020-01-11: qty 1

## 2020-01-11 MED ORDER — POTASSIUM CHLORIDE CRYS ER 20 MEQ PO TBCR
40.0000 meq | EXTENDED_RELEASE_TABLET | Freq: Once | ORAL | Status: AC
  Administered 2020-01-11: 40 meq via ORAL
  Filled 2020-01-11: qty 2

## 2020-01-11 MED ORDER — K PHOS MONO-SOD PHOS DI & MONO 155-852-130 MG PO TABS
500.0000 mg | ORAL_TABLET | ORAL | Status: AC
  Administered 2020-01-11 (×4): 500 mg via ORAL
  Filled 2020-01-11 (×4): qty 2

## 2020-01-11 MED ORDER — FUROSEMIDE 10 MG/ML IJ SOLN
40.0000 mg | Freq: Three times a day (TID) | INTRAMUSCULAR | Status: DC
  Administered 2020-01-11 – 2020-01-12 (×3): 40 mg via INTRAVENOUS
  Filled 2020-01-11 (×3): qty 4

## 2020-01-11 MED ORDER — MAGNESIUM SULFATE 2 GM/50ML IV SOLN
2.0000 g | Freq: Once | INTRAVENOUS | Status: AC
  Administered 2020-01-11: 2 g via INTRAVENOUS
  Filled 2020-01-11: qty 50

## 2020-01-11 NOTE — Consult Note (Addendum)
ANTICOAGULATION CONSULT NOTE  Pharmacy Consult for warfarin w/ Lovenox bridge Indication: VTE prophylaxis (h/o DVT on warfarin PTA)  Patient Measurements: Heparin Dosing Weight: 119 kg  Labs: Recent Labs    01/08/20 1624 01/08/20 2358 01/09/20 0906 01/10/20 1324 01/11/20 0619  HGB  --   --  11.8*  --   --   HCT  --   --  36.0*  --   --   PLT  --   --  151  --   --   LABPROT  --   --   --  14.9 16.3*  INR  --   --   --  1.2 1.4*  HEPARINUNFRC 0.40 0.29* 0.26*  --   --   CREATININE  --   --  0.83 0.96 0.81    Estimated Creatinine Clearance: 166.8 mL/min (by C-G formula based on SCr of 0.81 mg/dL).   Medical History: Past Medical History:  Diagnosis Date  . Allergy   . Anxiety   . Arthritis   . Asthma   . Brain damage   . Chronic pain of both knees 07/13/2018  . Clotting disorder (HCC)   . COPD (chronic obstructive pulmonary disease) (HCC)   . Depression   . GERD (gastroesophageal reflux disease)   . HFrEF (heart failure with reduced ejection fraction) (HCC)    a. 03/2018 Echo: EF 25-30%, diff HK. Mod LAE.  [name] History of DVT (deep vein thrombosis)   . History of pulmonary embolism    a. Chronic coumadin.  [name] Hypertension   . MI (myocardial infarction) (HCC)   . Morbid obesity (HCC)   . Neck pain 07/13/2018  . NICM (nonischemic cardiomyopathy) (HCC)    a. s/p Cath x 3 - reportedly nl cors. Last cath 2019 in [place]; b. a. 03/2018 Echo: EF 25-30%, diff HK.  [name] Persistent atrial fibrillation (HCC)    a. 03/2018 s/p DCCV; b. CHA2DS2VASc = 1-->Xarelto (later changed to warfarin); c. 05/2018 recurrent afib-->Amio initiated.  . Sleep apnea   . Sleep apnea     Medications:  Warfarin 5 mg daily prior to admission  Per RN, patient is un-sure of how he takes warfarin at home but does report compliance  Assessment: Patient is a 59 y/o M with medical history as above who presented to the ED 11/22 with shortness of breath. Troponin 77 >> 90. Pharmacy consulted to initiate heparin  infusion for ACS. DDI: keflex, amiodarone.   Per discussion with MD, will restart warfarin and bridge with Lovenox. Patient currently received his fourth dose and given patient is > 180 kg will need to obtain a anti-Xa level four hours after last dose. Last dose of enoxaparin was today at [id number]. Enoxaparin goal 0.6-1.0 units/mL (BID dosing).  Patient did receive warfarin 10 mg yesterday.   Date INR Dose 11/22 1.3  11/24  10 mg 11/25 1.2 10 mg 11/26 1.4 ---  Goal of Therapy:  Heparin level 0.3-0.7 units/ml Monitor platelets by anticoagulation protocol: Yes   Plan:   Obtain anti-Xa level for 1100 today.   INR is subtherapeutic. Will continue Lovenox 1 mg/kg (200 mg) BID bridge and order warfarin 10 mg x 1. Daily INR ordered. CBC every 72 hours at least.   Addenedum @ 1418: Heparin LMW 1.38 units/mL. Supratherapeutic. Will reduce lovenox by 20%. Therefore, will reduce lovenox to 158 mg Q12H (0.8 mg/kg; round to 160 mg)  Katha Cabal, PharmD 01/11/2020,7:56 AM

## 2020-01-11 NOTE — Progress Notes (Signed)
[facility]  - [place] [facility]   PATIENT NAME: [name] [name]    MR#:  [id number]  DATE OF BIRTH:  01/15/1961  SUBJECTIVE:  patient came in with increasing shortness of breath and dizziness and leg swelling.   Lives at home by himself. Appears to have gained a lot of weight.  Headache today  REVIEW OF SYSTEMS:   Review of Systems  Constitutional: Negative for chills, fever and weight loss.  HENT: Negative for ear discharge, ear pain and nosebleeds.   Eyes: Negative for blurred vision, pain and discharge.  Respiratory: Positive for shortness of breath. Negative for sputum production, wheezing and stridor.   Cardiovascular: Positive for leg swelling. Negative for chest pain and palpitations.  Gastrointestinal: Negative for abdominal pain, diarrhea, nausea and vomiting.  Genitourinary: Negative for frequency and urgency.  Musculoskeletal: Negative for back pain and joint pain.  Neurological: Positive for weakness and headaches. Negative for sensory change, speech change and focal weakness.  Psychiatric/Behavioral: Negative for depression and hallucinations. The patient is not nervous/anxious.    Tolerating Diet:yes   DRUG ALLERGIES:  No Known Allergies  VITALS:  Blood pressure 127/75, pulse 85, temperature 98.1 F (36.7 C), resp. rate 20, height 5\' 8"  (1.727 m), weight (!) 197.6 kg, SpO2 100 %.  PHYSICAL EXAMINATION:   Physical Exam  GENERAL:  59 y.o.-year-old patient lying in the bed with no acute distress. Severe morbid obesity, anasarca HEENT: Head atraumatic, normocephalic. Oropharynx and nasopharynx clear.   LUNGS: distant breath sounds bilaterally, no wheezing, rales, rhonchi. No use of accessory muscles of respiration. No respiratory distress CARDIOVASCULAR: S1, S2 normal. No murmurs, rubs, or gallops.  ABDOMEN: Soft, nontender, nondistended. Bowel sounds present. No organomegaly or mass.  EXTREMITIES: ++ edema  NEUROLOGIC: Cranial nerves II  through XII are intact. No focal Motor or sensory deficits b/l.   PSYCHIATRIC:  patient is alert and oriented x 3.  SKIN:  Pressure Injury 06/25/19 Buttocks Right Stage 2 -  Partial thickness loss of dermis presenting as a shallow open injury with a red, pink wound bed without slough. pink (Active)  06/25/19 1453  Location: Buttocks  Location Orientation: Right  Staging: Stage 2 -  Partial thickness loss of dermis presenting as a shallow open injury with a red, pink wound bed without slough.  Wound Description (Comments): pink  Present on Admission:    LABORATORY PANEL:  CBC Recent Labs  Lab 01/09/20 0906  WBC 7.6  HGB 11.8*  HCT 36.0*  PLT 151    Chemistries  Recent Labs  Lab 01/09/20 0906 01/10/20 1324 01/11/20 0619  NA 131*   < > 131*  K 3.0*   < > 3.4*  CL 94*   < > 91*  CO2 25   < > 30  GLUCOSE 170*   < > 125*  BUN 7   < > 8  CREATININE 0.83   < > 0.81  CALCIUM 7.5*   < > 7.9*  MG 1.2*   < > 1.7  AST 43*  --   --   ALT 22  --   --   ALKPHOS 74  --   --   BILITOT 1.2  --   --    < > = values in this interval not displayed.   Cardiac Enzymes No results for input(s): TROPONINI in the last 168 hours. RADIOLOGY:  No results found. ASSESSMENT AND PLAN:  [name] [name] is a 59 y.o. 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 He presented to the emergency department for chief concerns of acute onset shortness of breath at 01/07/2020  Sepsis POA etio likely mild cellulitis with ulcer right leg -sepsis ruled out -lactic acid trending down -UA positive for trace leukocytes - blood cultures  negative -Broad-spectrum antibiotic with IV ceftriaxone and Vanco-- de-escalate to oral Keflex -- follow-up wound RN recommendations -- patient afebrile, white count normal, hemodynamically stable at present  Left lower leg swelling-differential includes cellulitis  -Ultrasound of lower extremity negative for DVT -- to oral Keflex  Acute on chronic respiratory failure-suspect acute on chronic diastolic heart failure with severe Anasarca suspected severe sleep apnea  -Patient requiring BiPAP-- now on nasal cannula oxygen--wears 3 Liters at home -- IV Lasix 40 mg BID--change to TID dosing today --good UOP  Elevated troponin-and chronic A. fib with RVR -Sublingual nitroglycerin 0.4 mg minimally reduces the pain -Cardiology Dr. Juliann Pares has been consulted and he will follow -Heparin GTT started per cardiology--changed to po warfarin -INR 1.3--bridge with SQ lovenox for now -was started on IV amiodarone gtt --now changed to po amiodarone.  Hyperlipidemia-atorvastatin 40 mg daily  hypertension-lisinopril 2.5 mg daily, metoprolol succinate 50 mg daily  Obstructive sleep apnea-currently on BiPAP, normally wears CPAP at night  History of gout-continue allopurinol 100 mg daily  Depression/anxiety-duloxetine 20 mg daily  Hypothyroid levothyroxine 50 mcg daily -- TSH 10.1--- increase levothyroxine to 75 g daily  patient has multiple comorbidities including morbidly obese. He is at a high risk for cardiorespiratory arrest. He is at a high risk for readmission.  Transfer to MedSurg  DVT prophylaxis: On Coumadin Code Status: Full code Diet: Heart healthy/carb modified Family Communication: No Disposition Plan:  home likley tomorrow Consults called: Cardiology Dr. Juliann Pares Admission status: Inpatient  Status is: Inpatient  Remains inpatient appropriate because:Ongoing active pain requiring inpatient pain management and Inpatient level of care appropriate due to severity of  illness   Dispo: The patient is from: Home              Anticipated d/c is to: Home              Anticipated d/c date is: tomorrow              Patient currently is not medically stable to d/c.  will continue aggressive IV Lasix diuresis one more day.  Per TOC notes-- given patient's Medicaid insurance challenging to obtain home health. TOTAL TIME TAKING CARE OF THIS PATIENT: 25 minutes.  >50% time spent on counselling and coordination of care  Note: This dictation was prepared with Dragon dictation along with smaller phrase technology. Any transcriptional errors that result from this process are unintentional.  Enedina Finner M.D    [facility]   CC: Primary care physician; [name], DOPatient ID: [name] [name], male   DOB: 1960-04-23, 59 y.o.   MRN: [id number]

## 2020-01-11 NOTE — TOC Initial Note (Signed)
Transition of Care [facility]) - Initial/Assessment Note    Patient Details  Name: [name] [name] MRN: [id number] Date of Birth: [birth year]  [facility] CM/SW Contact:    Margarito Liner, LCSW Phone Number: 01/11/2020, 10:01 AM  Clinical Narrative:  Readmission prevention screen completed by this CSW on 11/8. PCP is Saralyn Pilar, DO. Patient uses Medicaid Transportation to get to appointments. Pharmacy is [facility]. No issues obtaining medications. CSW was unable to find an agency to accept patient for home health during that admission due to insurance and staffing. He has a wheelchair and rollator at home. He is also on 3 L oxygen chronic which is supplied through [employer]. No further concerns. CSW will follow patient for support and facilitate return home when stable.        Expected Discharge Plan: Home/Self Care Barriers to Discharge: Continued Medical Work up   Patient Goals and CMS Choice        Expected Discharge Plan and Services Expected Discharge Plan: Home/Self Care       Living arrangements for the past 2 months: Single Family Home Expected Discharge Date: 01/12/20                                    Prior Living Arrangements/Services Living arrangements for the past 2 months: Single Family Home Lives with:: Self Patient language and need for interpreter reviewed:: Yes Do you feel safe going back to the place where you live?: Yes      Need for Family Participation in Patient Care: Yes (Comment)   Current home services: DME Criminal Activity/Legal Involvement Pertinent to Current Situation/Hospitalization: No - Comment as needed  Activities of Daily Living Home Assistive Devices/Equipment: None ADL Screening (condition at time of admission) Patient's cognitive ability adequate to safely complete daily activities?: Yes Is the patient deaf or have difficulty hearing?: Yes (deaf in left ear) Does the patient have difficulty seeing, even  when wearing glasses/contacts?: No Does the patient have difficulty concentrating, remembering, or making decisions?: No Patient able to express need for assistance with ADLs?: Yes Does the patient have difficulty dressing or bathing?: Yes Independently performs ADLs?: Yes (appropriate for developmental age) Does the patient have difficulty walking or climbing stairs?: Yes Weakness of Legs: Both Weakness of Arms/Hands: Both  Permission Sought/Granted                  Emotional Assessment Appearance:: Appears stated age     Orientation: : Oriented to Self, Oriented to Place, Oriented to  Time, Oriented to Situation Alcohol / Substance Use: Not Applicable Psych Involvement: No (comment)  Admission diagnosis:  Sepsis (HCC) [A41.9] Patient Active Problem List   Diagnosis Date Noted  . Community acquired pneumonia 12/25/2019  . Shortness of breath 12/22/2019  . Atrial fibrillation, chronic (HCC) 12/22/2019  . Class 3 obesity with alveolar hypoventilation, serious comorbidity, and body mass index (BMI) of 60.0 to 69.9 in adult (HCC) 12/22/2019  . GAD (generalized anxiety disorder) 10/12/2019  . Alcohol withdrawal syndrome without complication (HCC)   . Lower extremity ulceration, unspecified laterality, with fat layer exposed (HCC) 08/09/2019  . Alcohol abuse   . Pressure injury of skin 06/26/2019  . Iron deficiency anemia 06/22/2019  . Depression 06/22/2019  . Elevated troponin 06/22/2019  . Left-sided Bell's palsy 05/08/2019  . Chronic intractable headache 05/03/2019  . Head injuries, initial encounter 05/03/2019  . Noncompliance by refusing service  05/03/2019  . Facial droop 05/02/2019  . Pharmacologic therapy 04/11/2019  . Disorder of skeletal system 04/11/2019  . Problems influencing health status 04/11/2019  . Chronic anticoagulation (warfarin  COUMADIN) 04/11/2019  . Elevated sed rate 04/11/2019  . Elevated C-reactive protein (CRP) 04/11/2019  . Elevated  hemoglobin A1c 04/11/2019  . Hypoalbuminemia 04/11/2019  . Edema due to hypoalbuminemia 04/11/2019  . Elevated brain natriuretic peptide (BNP) level 04/11/2019  . Chronic hip pain (Right) 04/11/2019  . Osteoarthritis of hip (Right) 04/11/2019  . Degenerative joint disease of right hip 03/05/2019  . Hypothyroidism 03/04/2019  . Chronic venous stasis dermatitis of both lower extremities 03/04/2019  . Long term (current) use of anticoagulants 02/23/2019  . PE (pulmonary thromboembolism) (HCC) 01/21/2019  . HLD (hyperlipidemia) 01/21/2019  . CAD (coronary artery disease) 01/21/2019  . Cellulitis of right lower extremity 01/21/2019  . Noncompliance 01/09/2019  . Acquired thrombophilia (HCC) 11/28/2018  . Major depressive disorder, recurrent episode, in partial remission (HCC) 09/17/2018  . Chest pain 08/06/2018  . Personal history of DVT (deep vein thrombosis) 07/13/2018  . History of pulmonary embolism (on Coumadin) 07/13/2018  . Neck pain 07/13/2018  . Chronic low back pain (Bilateral)  w/ sciatica (Bilateral) 07/13/2018  . TBI (traumatic brain injury) (HCC) 07/04/2018  . Abnormal thyroid blood test 06/27/2018  . Type 2 diabetes mellitus with other specified complication (HCC) 06/06/2018  . HTN (hypertension) 06/06/2018  . Chronic gout involving toe, unspecified cause, unspecified laterality 06/06/2018  . Morbid obesity with BMI of 60.0-69.9, adult (HCC) 06/06/2018  . Chronic pain syndrome 06/06/2018  . Ulcers of both lower extremities, limited to breakdown of skin (HCC) 06/06/2018  . Gout 06/06/2018  . Persistent atrial fibrillation (HCC)   . Severe sepsis with septic shock (HCC) 03/17/2018  . Centrilobular emphysema (HCC) 02/02/2018  . OSA (obstructive sleep apnea) 02/02/2018  . Lymphedema 02/02/2018  . Acute on chronic respiratory failure with hypoxia (HCC) 01/27/2018   PCP:  [name], DO Pharmacy:   [facility] - [place], [place] - [address] [place] [address] Phone: [phone] Fax: [phone]     Social Determinants of Health (SDOH) Interventions    Readmission Risk Interventions Readmission Risk Prevention Plan 12/24/2019 01/25/2019 10/25/2018  Transportation Screening Complete Complete Complete  PCP or Specialist Appt within 3-5 Days - - Complete  HRI or Home Care Consult - - Complete  Social Work Consult for Recovery Care Planning/Counseling - - Complete  Palliative Care Screening - - Not Applicable  Medication Review Oceanographer) Complete Complete Complete  PCP or Specialist appointment within 3-5 days of discharge Complete Complete -  HRI or Home Care Consult - Complete -  SW Recovery Care/Counseling Consult Complete Complete -  Palliative Care Screening Not Applicable Not Applicable -  Skilled Nursing Facility Not Applicable Not Applicable -

## 2020-01-11 NOTE — Consult Note (Signed)
PHARMACY CONSULT NOTE - FOLLOW UP  Pharmacy Consult for Electrolyte Monitoring and Replacement   Recent Labs:  Potassium (mmol/L)  Date Value  01/11/2020 3.4 (L)   Magnesium (mg/dL)  Date Value  62/26/3335 1.7   Calcium (mg/dL)  Date Value  45/62/5638 7.9 (L)   Albumin (g/dL)  Date Value  93/73/4287 3.2 (L)  05/02/2019 3.8   Phosphorus (mg/dL)  Date Value  68/12/5724 1.8 (L)   Sodium (mmol/L)  Date Value  01/11/2020 131 (L)  06/06/2019 139     Assessment: Pt is 59yo M who presented initially with sepsis secondary to Lt lower limb cellulitis complicated by acute on chronic respiratory failure and Afib with RVR. On admission electrolytes were WNL. Pharmacy now consulted to manage electrolytes. On amio and lasix 40 mg IV BID. Lisinopril d/c'ed. Patient received electrolyte replacement yesterday, but remains hypokalemic, hypophosphoric, and hypomagnesia.     K - 3.4 Phos - 1.8 Mg - 1.7  Goal of Therapy:  WNL  Plan:  KCl 40 mEq x 1 PO Mg 2 g IV x 1  KPhos 2 tabs x 4   F/u with Am labs.    Cephus Shelling, PharmD Clinical Pharmacist 01/11/2020 7:44 AM

## 2020-01-12 DIAGNOSIS — L03115 Cellulitis of right lower limb: Secondary | ICD-10-CM | POA: Diagnosis not present

## 2020-01-12 DIAGNOSIS — A419 Sepsis, unspecified organism: Secondary | ICD-10-CM | POA: Diagnosis not present

## 2020-01-12 DIAGNOSIS — I2510 Atherosclerotic heart disease of native coronary artery without angina pectoris: Secondary | ICD-10-CM | POA: Diagnosis not present

## 2020-01-12 DIAGNOSIS — R6521 Severe sepsis with septic shock: Secondary | ICD-10-CM | POA: Diagnosis not present

## 2020-01-12 DIAGNOSIS — J9621 Acute and chronic respiratory failure with hypoxia: Secondary | ICD-10-CM | POA: Diagnosis not present

## 2020-01-12 DIAGNOSIS — I482 Chronic atrial fibrillation, unspecified: Secondary | ICD-10-CM | POA: Diagnosis not present

## 2020-01-12 LAB — RENAL FUNCTION PANEL
Albumin: 2.7 g/dL — ABNORMAL LOW (ref 3.5–5.0)
Anion gap: 9 (ref 5–15)
BUN: 7 mg/dL (ref 6–20)
CO2: 34 mmol/L — ABNORMAL HIGH (ref 22–32)
Calcium: 8 mg/dL — ABNORMAL LOW (ref 8.9–10.3)
Chloride: 93 mmol/L — ABNORMAL LOW (ref 98–111)
Creatinine, Ser: 0.85 mg/dL (ref 0.61–1.24)
GFR, Estimated: >60 mL/min (ref >60)
Glucose, Bld: 113 mg/dL — ABNORMAL HIGH (ref 70–99)
Phosphorus: 2.7 mg/dL (ref 2.5–4.6)
Potassium: 3.2 mmol/L — ABNORMAL LOW (ref 3.5–5.1)
Sodium: 136 mmol/L (ref 135–145)

## 2020-01-12 LAB — CBC
HCT: 32.1 % — ABNORMAL LOW (ref 39.0–52.0)
Hemoglobin: 10.5 g/dL — ABNORMAL LOW (ref 13.0–17.0)
MCH: 30.2 pg (ref 26.0–34.0)
MCHC: 32.7 g/dL (ref 30.0–36.0)
MCV: 92.2 fL (ref 80.0–100.0)
Platelets: 99 10*3/uL — ABNORMAL LOW (ref 150–400)
RBC: 3.48 MIL/uL — ABNORMAL LOW (ref 4.22–5.81)
RDW: 15 % (ref 11.5–15.5)
WBC: 3.7 10*3/uL — ABNORMAL LOW (ref 4.0–10.5)
nRBC: 1.4 % — ABNORMAL HIGH (ref 0.0–0.2)

## 2020-01-12 LAB — PROTIME-INR
INR: 1.8 — ABNORMAL HIGH (ref 0.8–1.2)
Prothrombin Time: 20.6 seconds — ABNORMAL HIGH (ref 11.4–15.2)

## 2020-01-12 LAB — CULTURE, BLOOD (SINGLE)
Culture: NO GROWTH
Special Requests: ADEQUATE

## 2020-01-12 LAB — MAGNESIUM: Magnesium: 1.4 mg/dL — ABNORMAL LOW (ref 1.7–2.4)

## 2020-01-12 MED ORDER — LEVOTHYROXINE SODIUM 75 MCG PO TABS
75.0000 ug | ORAL_TABLET | Freq: Every day | ORAL | 3 refills | Status: DC
Start: 2020-01-13 — End: 2020-09-12

## 2020-01-12 MED ORDER — MAGNESIUM OXIDE 400 (241.3 MG) MG PO TABS
400.0000 mg | ORAL_TABLET | Freq: Two times a day (BID) | ORAL | 3 refills | Status: DC
Start: 2020-01-12 — End: 2020-06-29

## 2020-01-12 MED ORDER — CEPHALEXIN 500 MG PO CAPS: 500.0000 mg | ORAL_CAPSULE | Freq: Three times a day (TID) | ORAL | 0 refills | Status: AC

## 2020-01-12 MED ORDER — FUROSEMIDE 40 MG PO TABS: 40.0000 mg | ORAL_TABLET | Freq: Two times a day (BID) | ORAL | 11 refills | Status: DC

## 2020-01-12 MED ORDER — MAGNESIUM OXIDE 400 (241.3 MG) MG PO TABS
400.0000 mg | ORAL_TABLET | Freq: Two times a day (BID) | ORAL | Status: DC
  Administered 2020-01-12: 400 mg via ORAL
  Filled 2020-01-12: qty 1

## 2020-01-12 MED ORDER — POTASSIUM CHLORIDE 20 MEQ PO PACK
40.0000 meq | PACK | ORAL | Status: AC
  Administered 2020-01-12: 40 meq via ORAL
  Filled 2020-01-12 (×2): qty 2

## 2020-01-12 MED ORDER — METOPROLOL SUCCINATE ER 50 MG PO TB24: 100.0000 mg | ORAL_TABLET | Freq: Every day | ORAL | 4 refills | Status: DC

## 2020-01-12 MED ORDER — POTASSIUM CHLORIDE 20 MEQ PO PACK
40.0000 meq | PACK | Freq: Every day | ORAL | 2 refills | Status: DC
Start: 2020-01-12 — End: 2020-05-02

## 2020-01-12 MED ORDER — AMIODARONE HCL 200 MG PO TABS
200.0000 mg | ORAL_TABLET | Freq: Two times a day (BID) | ORAL | 1 refills | Status: DC
Start: 2020-01-12 — End: 2020-04-28

## 2020-01-12 MED ORDER — MAGNESIUM SULFATE 4 GM/100ML IV SOLN
4.0000 g | Freq: Once | INTRAVENOUS | Status: AC
  Administered 2020-01-12: 4 g via INTRAVENOUS
  Filled 2020-01-12: qty 100

## 2020-01-12 NOTE — Discharge Instructions (Signed)
Use your oxygen as before F/u wound clinic as before

## 2020-01-12 NOTE — Consult Note (Signed)
PHARMACY CONSULT NOTE - FOLLOW UP  Pharmacy Consult for Electrolyte Monitoring and Replacement   Recent Labs:  Potassium (mmol/L)  Date Value  01/12/2020 3.2 (L)   Magnesium (mg/dL)  Date Value  70/62/3762 1.4 (L)   Calcium (mg/dL)  Date Value  83/15/1761 8.0 (L)   Albumin (g/dL)  Date Value  60/73/7106 2.7 (L)  05/02/2019 3.8   Phosphorus (mg/dL)  Date Value  26/94/8546 2.7   Sodium (mmol/L)  Date Value  01/12/2020 136  06/06/2019 139     Assessment: Pt is 59yo M who presented initially with sepsis secondary to Lt lower limb cellulitis complicated by acute on chronic respiratory failure and Afib with RVR. On admission electrolytes were WNL. Pharmacy now consulted to manage electrolytes. On amio and lasix 40 mg IV BID. Lisinopril d/c'ed. Patient received electrolyte replacement yesterday, but remains hypokalemic, hypophosphoric, and hypomagnesia.     K - 3.4>3.2 Phos - 1.8>2.7 Mg - 1.7>1.4  Goal of Therapy:  WNL  Plan:  KCl 40 mEq q2h x 2 PO Mg 4 g IV x 1; then PO mag 400 BID x2 doses    F/u with Am labs.    Cephus Shelling, PharmD Clinical Pharmacist 01/12/2020 9:12 AM

## 2020-01-12 NOTE — Consult Note (Addendum)
ANTICOAGULATION CONSULT NOTE  Pharmacy Consult for warfarin w/ Lovenox bridge Indication: VTE prophylaxis (h/o DVT on warfarin PTA)  Patient Measurements: Heparin Dosing Weight: 119 kg  Labs: Recent Labs    01/10/20 1324 01/11/20 0619 01/11/20 1052 01/12/20 0637  HGB  --   --   --  10.5*  HCT  --   --   --  32.1*  PLT  --   --   --  99*  LABPROT 14.9 16.3*  --  20.6*  INR 1.2 1.4*  --  1.8*  HEPRLOWMOCWT  --   --  1.38  --   CREATININE 0.96 0.81  --  0.85    Estimated Creatinine Clearance: 159 mL/min (by C-G formula based on SCr of 0.85 mg/dL).   Medical History: Past Medical History:  Diagnosis Date  . Allergy   . Anxiety   . Arthritis   . Asthma   . Brain damage   . Chronic pain of both knees 07/13/2018  . Clotting disorder (HCC)   . COPD (chronic obstructive pulmonary disease) (HCC)   . Depression   . GERD (gastroesophageal reflux disease)   . HFrEF (heart failure with reduced ejection fraction) (HCC)    a. 03/2018 Echo: EF 25-30%, diff HK. Mod LAE.  [name] History of DVT (deep vein thrombosis)   . History of pulmonary embolism    a. Chronic coumadin.  [name] Hypertension   . MI (myocardial infarction) (HCC)   . Morbid obesity (HCC)   . Neck pain 07/13/2018  . NICM (nonischemic cardiomyopathy) (HCC)    a. s/p Cath x 3 - reportedly nl cors. Last cath 2019 in [place]; b. a. 03/2018 Echo: EF 25-30%, diff HK.  [name] Persistent atrial fibrillation (HCC)    a. 03/2018 s/p DCCV; b. CHA2DS2VASc = 1-->Xarelto (later changed to warfarin); c. 05/2018 recurrent afib-->Amio initiated.  . Sleep apnea   . Sleep apnea     Medications:  Warfarin 5 mg daily prior to admission  Per RN, patient is un-sure of how he takes warfarin at home but does report compliance  Assessment: Patient is a 59 y/o M with medical history as above who presented to the ED 11/22 with shortness of breath. Troponin 77 >> 90. Pharmacy consulted to initiate heparin infusion for ACS. DDI: keflex, amiodarone.       Date INR Dose 11/22 1.3 --- 11/24 --- 10 mg 11/25 1.2 10 mg 11/26 1.4 10 mg 11/27 1.8 rec 5 mg  Goal of Therapy:  Heparin level 0.3-0.7 units/ml Enoxaparin goal 0.6-1.0 units/mL (BID dosing). Monitor platelets by anticoagulation protocol: Yes   Plan:   LMWH lvl was 1.38 on 11/26 @1052 ; LMWH dose was reduced to 160mg  BID from 200.  INR today is increasing 1.4>1.8 (delta 0.4 from 0.2 prior) in response to 3 nights of 10mg  dose (PTA dose was 5mg  nightly).   Will continue Lovenox 160mg  (~0.8mg /kg) BID bridge tonight, and will reduce warfarin to 5mg  home dose x1 tonight in anticipation of continued rise of INR tomorrow. Daily INR ordered. CBC every 72 hours at least.   Per discussion with MD, given rise in INR from 1.4 to 1.8 and planning discharge today, will resume home dose upon discharge without further bridging.  , PharmD 01/12/2020,1:27 PM

## 2020-01-12 NOTE — Plan of Care (Signed)
Patient discharged to home with family. PIV removed once Magnesium finished with tip intact. Reviewed discharge instructions, appointments, and medication changes with patient who verbalized understanding. Family brought oxygen for transport.  Patient assisted to dress and escorted via w/c to main entrance.

## 2020-01-12 NOTE — Discharge Summary (Signed)
[facility] [place] [facility]   PATIENT NAME: [name] [name]    MR#:  [id number]  DATE OF BIRTH:  03/03/1960  DATE OF ADMISSION:  01/07/2020 ADMITTING PHYSICIAN: [name] N Cox, DO  DATE OF DISCHARGE: 01/12/2020  PRIMARY CARE PHYSICIAN: [name] Cords, DO    ADMISSION DIAGNOSIS:  Sepsis (HCC) [A41.9]  DISCHARGE DIAGNOSIS:  Sepsis Ruled out Acute on Chronic hypoxic resp failure due to CHF acute on chronic diastolic Anasarca Morbid obesity with OSA Chronic respiratory failure --on oxygen Cellulitis left LE--chronic ulcer s/p dressing+ SECONDARY DIAGNOSIS:   Past Medical History:  Diagnosis Date  . Allergy   . Anxiety   . Arthritis   . Asthma   . Brain damage   . Chronic pain of both knees 07/13/2018  . Clotting disorder (HCC)   . COPD (chronic obstructive pulmonary disease) (HCC)   . Depression   . GERD (gastroesophageal reflux disease)   . HFrEF (heart failure with reduced ejection fraction) (HCC)    a. 03/2018 Echo: EF 25-30%, diff HK. Mod LAE.  [name] History of DVT (deep vein thrombosis)   . History of pulmonary embolism    a. Chronic coumadin.  [name] Hypertension   . MI (myocardial infarction) (HCC)   . Morbid obesity (HCC)   . Neck pain 07/13/2018  . NICM (nonischemic cardiomyopathy) (HCC)    a. s/p Cath x 3 - reportedly nl cors. Last cath 2019 in [place]; b. a. 03/2018 Echo: EF 25-30%, diff HK.  [name] Persistent atrial fibrillation (HCC)    a. 03/2018 s/p DCCV; b. CHA2DS2VASc = 1-->Xarelto (later changed to warfarin); c. 05/2018 recurrent afib-->Amio initiated.  . Sleep apnea   . 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 He presented to the emergency department for chief concerns of acute onset shortness of breath at 01/07/2020  Sepsis POA etio likely mild cellulitis with ulcer right leg -sepsis ruled out -lactic acid trending down -UA positive for trace leukocytes - blood cultures negative -Broad-spectrum antibiotic with IV ceftriaxone and Vanco-- de-escalate to oral Keflex -- follow-up wound RN recommendations--pt follows at wound clinic -- patient afebrile, white count normal, hemodynamically stable at present  Left lower leg swelling-differential includes cellulitis  -Ultrasound of lower extremity negative for DVT -- to oral Keflex  Acute on chronic respiratory failure-suspect acute on chronic diastolic heart failure with severe Anasarca suspected severe sleep apnea  -Patient requiring BiPAP-- now on nasal cannula oxygen--wears 3 Liters at home -- IV Lasix 40 mg BID--change to TID dosing yday and put out 4.4 liters urine --good UOP --will d/c on 40 mg bid dosing of lasix for home with po KCL  Elevated troponin acute on chronic A. fib with RVR -Cardiology Dr. Juliann [name] has been consulted -Heparin GTT started per cardiology--changed to po warfarin -INR 1.3--bridge with SQ lovenox for now--INR 1.8--reusme warfarin 5 mg qhs per pharmacy for home discharge -was started on IV amiodarone gtt --now changed to po amiodarone.  Hyperlipidemia-atorvastatin 40 mg daily  Hypertension Metoprolol XL  Obstructive sleep apnea -cont CPAP at night  History of gout-continue allopurinol 100 mg daily  Depression/anxiety-duloxetine 20 mg daily  Hypothyroid levothyroxine 50 mcg daily --  TSH 10.1--- increase levothyroxine to 75 g daily  patient has multiple comorbidities including morbidly obese. He is at a high risk for cardiorespiratory arrest. He is at a high risk for readmission.  Transfer to  MedSurg  DVT prophylaxis:On Coumadin Code Status:Full code Diet:Heart healthy/carb modified Family Communication:No Disposition Plan: home today Consults called:Cardiology Dr. Juliann [name] Admission status:Inpatient  Status is: Inpatient   Dispo: The patient is from: Home  Anticipated d/c is to: Home  Anticipated d/c date is: today  Patient currently is medically stable to d/c.    CONSULTS OBTAINED:    DRUG ALLERGIES:  No Known Allergies  DISCHARGE MEDICATIONS:   Allergies as of 01/12/2020   No Known Allergies     Medication List    STOP taking these medications   fluticasone furoate-vilanterol 100-25 MCG/INH Aepb Commonly known as: BREO ELLIPTA   torsemide 20 MG tablet Commonly known as: DEMADEX     TAKE these medications   albuterol 108 (90 Base) MCG/ACT inhaler Commonly known as: VENTOLIN HFA Inhale 2 puffs into the lungs every 4 (four) hours as needed for wheezing or shortness of breath.   allopurinol 100 MG tablet Commonly known as: ZYLOPRIM TAKE 1 TABLET BY MOUTH ONCE DAILY   amiodarone 200 MG tablet Commonly known as: PACERONE Take 1 tablet (200 mg total) by mouth 2 (two) times daily.   aspirin 325 MG tablet Take 325 mg by mouth daily.   atorvastatin 40 MG tablet Commonly known as: LIPITOR TAKE 1 TABLET BY MOUTH ONCE DAILY   cephALEXin 500 MG capsule Commonly known as: KEFLEX Take 1 capsule (500 mg total) by mouth every 8 (eight) hours for 4 days.   DULoxetine 20 MG capsule Commonly known as: CYMBALTA Take 1 capsule (20 mg total) by mouth daily.   ferrous sulfate 325 (65 FE) MG tablet Take 325 mg by mouth 2 (two) times daily.   fluticasone 50 MCG/ACT nasal spray Commonly known as: FLONASE Place 2 sprays into both nostrils 2 (two) times daily.   furosemide 40 MG tablet Commonly known as: Lasix Take 1 tablet (40 mg total) by mouth 2 (two) times daily.   HYDROcodone-acetaminophen 7.5-325 MG  tablet Commonly known as: NORCO Take 1 tablet by mouth every 8 (eight) hours as needed for moderate pain or severe pain.   levothyroxine 75 MCG tablet Commonly known as: SYNTHROID Take 1 tablet (75 mcg total) by mouth daily before breakfast. Start taking on: January 13, 2020 What changed:  medication strength how much to take   loperamide 2 MG capsule Commonly known as: IMODIUM Take 1 capsule (2 mg total) by mouth as needed for diarrhea or loose stools.   magnesium oxide 400 (241.3 Mg) MG tablet Commonly known as: MAG-OX Take 1 tablet (400 mg total) by mouth 2 (two) times daily.   metoprolol succinate 50 MG 24 hr tablet Commonly known as: TOPROL-XL Take 2 tablets (100 mg total) by mouth daily. Take with or immediately following a meal. What changed: how much to take   montelukast 10 MG tablet Commonly known as: SINGULAIR Take 1 tablet (10 mg total) by mouth at bedtime.   multivitamin with minerals Tabs tablet Take 1 tablet by mouth daily.   naphazoline-glycerin 0.012-0.2 % Soln Commonly known as: CLEAR EYES REDNESS Place 1-2 drops into both eyes 4 (four) times daily as needed for eye irritation.   nitroGLYCERIN 0.4 MG SL tablet Commonly known as: NITROSTAT Place 1 tablet under tongue every 5 minutes as needed for chest pain. (No  more than 3 doses within 15 minutes)   potassium chloride 20 MEQ packet Commonly known as: KLOR-CON Take 40 mEq by mouth daily.   SUMAtriptan 50 MG tablet Commonly known as: IMITREX Take 1 tablet (50 mg total) by mouth every 2 (two) hours as needed for migraine or headache. May repeat in 2 hours if headache persists or recurs. No more than 2 doses in a day   thiamine 100 MG tablet Take 1 tablet (100 mg total) by mouth daily.   traZODone 150 MG tablet Commonly known as: DESYREL Take 1 tablet (150 mg total) by mouth at bedtime.   warfarin 5 MG tablet Commonly known as: COUMADIN Take 5 mg by mouth daily.            Discharge Care  Instructions  (From admission, onward)         Start     Ordered   01/12/20 0000  Discharge wound care:       Comments: Cleanse legs with soap and water and pat dry Appy aquacel Ag to open wounds Wrap both legs with kerlix and secure with ace wraps   01/12/20 0937          If you experience worsening of your admission symptoms, develop shortness of breath, life threatening emergency, suicidal or homicidal thoughts you must seek medical attention immediately by calling 911 or calling your MD immediately  if symptoms less severe.  You Must read complete instructions/literature along with all the possible adverse reactions/side effects for all the Medicines you take and that have been prescribed to you. Take any new Medicines after you have completely understood and accept all the possible adverse reactions/side effects.   Please note  You were cared for by a hospitalist during your hospital stay. If you have any questions about your discharge medications or the care you received while you were in the hospital after you are discharged, you can call the unit and asked to speak with the hospitalist on call if the hospitalist that took care of you is not available. Once you are discharged, your primary care physician will handle any further medical issues. Please note that NO REFILLS for any discharge medications will be authorized once you are discharged, as it is imperative that you return to your primary care physician (or establish a relationship with a primary care physician if you do not have one) for your aftercare needs so that they can reassess your need for medications and monitor your lab values. Today   SUBJECTIVE   Sitting out in the chair Completing sentences w/o SOB UOP total over 24 was 4 liters  VITAL SIGNS:  Blood pressure 113/65, pulse 75, temperature 98.2 F (36.8 C), resp. rate 20, height 5\' 8"  (1.727 m), weight (!) 197.6 kg, SpO2 98 %.  I/O:    Intake/Output  Summary (Last 24 hours) at 01/12/2020 0956 Last data filed at 01/12/2020 0830 Gross per 24 hour  Intake 37.56 ml  Output 4575 ml  Net -4537.44 ml    PHYSICAL EXAMINATION:  GENERAL:  59 y.o.-year-old patient lying in the bed with no acute distress. Severe morbid obesity, anasarca HEENT: Head atraumatic, normocephalic. Oropharynx and nasopharynx clear.   LUNGS: distant breath sounds bilaterally, no wheezing, rales, rhonchi. No use of accessory muscles of respiration. No respiratory distress CARDIOVASCULAR: S1, S2 normal. No murmurs, rubs, or gallops.  ABDOMEN: Soft, nontender, nondistended. Bowel sounds present. No organomegaly or mass.  EXTREMITIES: ++ edema  NEUROLOGIC: Cranial nerves II through XII  are intact. No focal Motor or sensory deficits b/l.   PSYCHIATRIC:  patient is alert and oriented x 3.  SKIN:  Pressure Injury 06/25/19 Buttocks Right Stage 2 -  Partial thickness loss of dermis presenting as a shallow open injury with a red, pink wound bed without slough. pink (Active)  06/25/19 1453  Location: Buttocks  Location Orientation: Right  Staging: Stage 2 -  Partial thickness loss of dermis presenting as a shallow open injury with a red, pink wound bed without slough.  Wound Description (Comments): pink  Present on Admission    DATA REVIEW:   CBC  Recent Labs  Lab 01/12/20 0637  WBC 3.7*  HGB 10.5*  HCT 32.1*  PLT 99*    Chemistries  Recent Labs  Lab 01/09/20 0906 01/10/20 1324 01/12/20 0637  NA 131*   < > 136  K 3.0*   < > 3.2*  CL 94*   < > 93*  CO2 25   < > 34*  GLUCOSE 170*   < > 113*  BUN 7   < > 7  CREATININE 0.83   < > 0.85  CALCIUM 7.5*   < > 8.0*  MG 1.2*   < > 1.4*  AST 43*  --   --   ALT 22  --   --   ALKPHOS 74  --   --   BILITOT 1.2  --   --    < > = values in this interval not displayed.    Microbiology Results   Recent Results (from the past 240 hour(s))  Resp Panel by RT-PCR (Flu A&B, Covid) Nasopharyngeal Swab     Status: None    Collection Time: 01/07/20  3:36 PM   Specimen: Nasopharyngeal Swab; Nasopharyngeal(NP) swabs in vial transport medium  Result Value Ref Range Status   SARS Coronavirus 2 by RT PCR NEGATIVE NEGATIVE Final    Comment: (NOTE) SARS-CoV-2 target nucleic acids are NOT DETECTED.  The SARS-CoV-2 RNA is generally detectable in upper respiratory specimens during the acute phase of infection. The lowest concentration of SARS-CoV-2 viral copies this assay can detect is 138 copies/mL. A negative result does not preclude SARS-Cov-2 infection and should not be used as the sole basis for treatment or other patient management decisions. A negative result may occur with  improper specimen collection/handling, submission of specimen other than nasopharyngeal swab, presence of viral mutation(s) within the areas targeted by this assay, and inadequate number of viral copies(<138 copies/mL). A negative result must be combined with clinical observations, patient history, and epidemiological information. The expected result is Negative.  Fact Sheet for Patients:  BloggerCourse.com  Fact Sheet for Healthcare Providers:  SeriousBroker.it  This test is no t yet approved or cleared by the [place] FDA and  has been authorized for detection and/or diagnosis of SARS-CoV-2 by FDA under an Emergency Use Authorization (EUA). This EUA will remain  in effect (meaning this test can be used) for the duration of the COVID-19 declaration under Section 564(b)(1) of the Act, 21 U.S.C.section 360bbb-3(b)(1), unless the authorization is terminated  or revoked sooner.       Influenza A by PCR NEGATIVE NEGATIVE Final   Influenza B by PCR NEGATIVE NEGATIVE Final    Comment: (NOTE) The Xpert Xpress SARS-CoV-2/FLU/RSV plus assay is intended as an aid in the diagnosis of influenza from Nasopharyngeal swab specimens and should not be used as a sole basis for treatment.  Nasal washings and aspirates are unacceptable for Xpert  Xpress SARS-CoV-2/FLU/RSV testing.  Fact Sheet for Patients: BloggerCourse.com  Fact Sheet for Healthcare Providers: SeriousBroker.it  This test is not yet approved or cleared by the [place] FDA and has been authorized for detection and/or diagnosis of SARS-CoV-2 by FDA under an Emergency Use Authorization (EUA). This EUA will remain in effect (meaning this test can be used) for the duration of the COVID-19 declaration under Section 564(b)(1) of the Act, 21 U.S.C. section 360bbb-3(b)(1), unless the authorization is terminated or revoked.  Performed at [facility], [address] [place], [place] [address]   Culture, blood (single)     Status: None   Collection Time: 01/07/20  4:50 PM   Specimen: BLOOD  Result Value Ref Range Status   Specimen Description BLOOD RIGHT ANTECUBITAL  Final   Special Requests   Final    BOTTLES DRAWN AEROBIC AND ANAEROBIC Blood Culture adequate volume   Culture   Final    NO GROWTH 5 DAYS Performed at [facility], [address] [place], [place] [address]    Report Status 01/12/2020 FINAL  Final  MRSA PCR Screening     Status: None   Collection Time: 01/07/20  7:45 PM   Specimen: Nasopharyngeal  Result Value Ref Range Status   MRSA by PCR NEGATIVE NEGATIVE Final    Comment:        The GeneXpert MRSA Assay (FDA approved for NASAL specimens only), is one component of a comprehensive MRSA colonization surveillance program. It is not intended to diagnose MRSA infection nor to guide or monitor treatment for MRSA infections. Performed at [facility], [address] [place], [place] [address]   Gastrointestinal Panel by PCR , Stool     Status: None   Collection Time: 01/08/20 12:00 AM   Specimen: Stool  Result Value Ref Range Status   Campylobacter species NOT DETECTED NOT DETECTED Final    Plesimonas shigelloides NOT DETECTED NOT DETECTED Final   Salmonella species NOT DETECTED NOT DETECTED Final   Yersinia enterocolitica NOT DETECTED NOT DETECTED Final   Vibrio species NOT DETECTED NOT DETECTED Final   Vibrio cholerae NOT DETECTED NOT DETECTED Final   Enteroaggregative E coli (EAEC) NOT DETECTED NOT DETECTED Final   Enteropathogenic E coli (EPEC) NOT DETECTED NOT DETECTED Final   Enterotoxigenic E coli (ETEC) NOT DETECTED NOT DETECTED Final   Shiga like toxin producing E coli (STEC) NOT DETECTED NOT DETECTED Final   Shigella/Enteroinvasive E coli (EIEC) NOT DETECTED NOT DETECTED Final   Cryptosporidium NOT DETECTED NOT DETECTED Final   Cyclospora cayetanensis NOT DETECTED NOT DETECTED Final   Entamoeba histolytica NOT DETECTED NOT DETECTED Final   Giardia lamblia NOT DETECTED NOT DETECTED Final   Adenovirus F40/41 NOT DETECTED NOT DETECTED Final   Astrovirus NOT DETECTED NOT DETECTED Final   Norovirus GI/GII NOT DETECTED NOT DETECTED Final   Rotavirus A NOT DETECTED NOT DETECTED Final   Sapovirus (I, II, IV, and V) NOT DETECTED NOT DETECTED Final    Comment: Performed at [facility], [address] [place], [place] [address]  C Difficile Quick Screen w PCR reflex     Status: None   Collection Time: 01/09/20 10:15 AM   Specimen: STOOL  Result Value Ref Range Status   C Diff antigen NEGATIVE NEGATIVE Final   C Diff toxin NEGATIVE NEGATIVE Final   C Diff interpretation No C. difficile detected.  Final    Comment: Performed at [facility], [address] [place], [place]  [address]    RADIOLOGY:  No results found.   CODE STATUS:     Code Status Orders  (From admission, onward)         Start     Ordered   01/07/20 1852  Full code  Continuous        01/07/20 1855        Code Status History    Date Active Date Inactive Code Status Order ID Comments User Context   12/22/2019 1631 12/28/2019 2132 Partial Code [id number]  CoxNadyne Coombes, DO ED    11/07/2019 1928 11/10/2019 1942 Full Code [id number]  Lorretta Harp, MD ED   08/31/2019 1614 09/02/2019 0231 Full Code [id number]  Shaune Pollack, MD ED   08/09/2019 0621 08/14/2019 2012 Full Code [id number]  Andris Baumann, MD ED   06/22/2019 1531 06/26/2019 1742 Full Code [id number]  Lorretta Harp, MD Inpatient   03/04/2019 2314 03/05/2019 2105 Full Code [id number]  Anselm Jungling, DO ED   10/24/2018 0420 10/28/2018 1849 Full Code [id number]  Oralia Manis, MD Inpatient   08/06/2018 1815 08/10/2018 2042 Full Code [id number]  Alford Highland, MD ED   07/18/2018 0716 07/23/2018 1827 Full Code [id number]  Arnaldo Natal, MD ED   05/29/2018 1453 06/01/2018 1931 Full Code [id number]  Bertrum Sol, MD Inpatient   03/17/2018 0042 03/25/2018 1554 Full Code [id number]  Altamese Dilling, MD ED   02/24/2018 0725 02/27/2018 1555 Full Code [id number]  Oralia Manis, MD Inpatient   01/27/2018 1345 01/29/2018 1406 Full Code [id number]  Alford Highland, MD ED   01/17/2018 1330 01/19/2018 1818 Partial Code [id number]  Milagros Loll, MD Inpatient   01/16/2018 1427 01/17/2018 1330 Full Code [id number]  Auburn Bilberry, MD ED   Advance Care Planning Activity       TOTAL TIME TAKING CARE OF THIS PATIENT: *35* minutes.    Enedina Finner M.D  [facility]    CC: Primary care physician; [name] Cords, DO

## 2020-01-14 ENCOUNTER — Telehealth (HOSPITAL_COMMUNITY): Payer: Self-pay

## 2020-01-14 ENCOUNTER — Telehealth: Payer: Self-pay

## 2020-01-14 ENCOUNTER — Telehealth: Payer: Self-pay | Admitting: Primary Care

## 2020-01-14 NOTE — Telephone Encounter (Signed)
Called patient's home number to offer to schedule a Palliative Consult in the home, no answer - left message with reason for call along with my name and call back number.  I also called listed cell number, no answer and unable to leave a message due to mailbox was full.  I then called patient's sister-in-law, [name], with no answer and unable to leave a message due to no voicemail set up.

## 2020-01-14 NOTE — Telephone Encounter (Signed)
Today was able to get in touch with [name] [name].  Advised him he missed a phone call from Palliative Care, advised him he needs to call her back.  He states he is doing ok, could be better.  He states has medications being delivered today, he states he has been taking his medications just not his new discharged meds which he will begin today.  Set up a home visit for this week on Wednesday.  He says he has enough for daily living.  He has no specific complaints except pain.  He states he wants to change PCP again because of his medicaid card.  He states tries to watch high sodium foods but affordability it is tough.  He also wants things he likes to eat.   Will continue to visit for heart failure.   [name] EMT-Paramedic [phone]

## 2020-01-16 ENCOUNTER — Other Ambulatory Visit (HOSPITAL_COMMUNITY): Payer: Self-pay

## 2020-01-16 ENCOUNTER — Encounter (HOSPITAL_COMMUNITY): Payer: Self-pay

## 2020-01-16 NOTE — Progress Notes (Signed)
Today had a home visit with [name] [name].  He says he is doing ok just hurts all the time.  He has pain meds but he says they do not help.  He has not contacted Palliative Care nurse back, was able to get in touch with her and scheduled a home visit for them on this Friday.  He is in agreement for this visit.  He has all his medications, he gets pill packs from [facility].  He is unhappy with his primary doctor, he states they will not refer him to a pain clinic.  He says it is because his medicaid card does not have his PCP name on it.  He says he called medicaid and they said he does not need it on it now.  He has no one coming in for PT or nursing right now.  He states he will make Wound Care appt for next week.  He states legs are not as bad as they have been.  They appear very swollen to me, they are wrapped.  He sits in a desk chair all day and most of night.  He does sleep in the bed some.  He has no other furniture in his living room.  Will try to find someway to get him a recliner, he says he will sit in one.  Advised him he has to get those legs elevated to get better.  He states he has been looking on [employer] to see if he could afford one.  He denies any chest pain, shortness of breath except with exertion, headaches or dizziness.  He states he is where he can not stand without getting short of breath.  He sits in one spot all day, he cooks, eats, plays on computer watches tv in one spot.  He has his apartment organized where he does not have to move.  Advised him he needs to move around some.  Attempted to discuss diet, he was not interested in talking about reading labels on some of the foods he has in his kitchen.  He states he would rather not know.  He has canned chili, beef stew and Mt Dew drinks.  He states he drinks 2 sodas a day then water.  He is getting groceries delivered and meds are delivered.  Will continue to visit for heart failure, diet and medication control.   [name] [name]  EMT-Paramedic [phone]

## 2020-01-18 ENCOUNTER — Other Ambulatory Visit: Payer: Medicaid Other | Admitting: Primary Care

## 2020-01-18 ENCOUNTER — Other Ambulatory Visit: Payer: Self-pay

## 2020-01-18 DIAGNOSIS — Z7901 Long term (current) use of anticoagulants: Secondary | ICD-10-CM

## 2020-01-18 DIAGNOSIS — G8929 Other chronic pain: Secondary | ICD-10-CM

## 2020-01-18 DIAGNOSIS — Z6841 Body Mass Index (BMI) 40.0 and over, adult: Secondary | ICD-10-CM

## 2020-01-18 DIAGNOSIS — M1611 Unilateral primary osteoarthritis, right hip: Secondary | ICD-10-CM

## 2020-01-18 DIAGNOSIS — Z515 Encounter for palliative care: Secondary | ICD-10-CM

## 2020-01-18 DIAGNOSIS — E662 Morbid (severe) obesity with alveolar hypoventilation: Secondary | ICD-10-CM

## 2020-01-18 NOTE — Progress Notes (Signed)
[facility] Consult Note Telephone: [phone]  Fax: [phone]     Date of encounter: 01/18/20 PATIENT NAME: [name] [name] [address] [place] [address] [phone] (home)  DOB: 28-Dec-1960 MRN: [id number]  PRIMARY CARE PROVIDER:    [name], DO,  [place] [address] [phone]  REFERRING PROVIDER:   Alisa Graff, [address] [place],  [place] [address] [phone]  RESPONSIBLE PARTY:   Extended Emergency Contact Information Primary Emergency Contact: Morton Amy Home Phone: [phone] Mobile Phone: [phone] Relation: Sister Preferred language: English Interpreter needed? No   I met face to face with patient  in the home. Palliative Care was asked to follow this patient by consultation request of Alisa Graff, FNP to help address advance care planning and goals of care. This is the initial visit.   ASSESSMENT AND RECOMMENDATIONS:   1. Advance Care Planning/Goals of Care: Goals include to maximize quality of life and symptom management. Our advance care planning conversation included a discussion about:     The value and importance of advance care planning    Exploration of personal, cultural or spiritual beliefs that might influence medical decisions   Patient gave his history of living in [place] before he moved here. He has had many health issues now. He is w/c bound in the house and states he can walk short distances to the car. However he has moved all of his personal effects to w/c height in his care area. He is concerned about getting services, stating he does not have a pcp on record or a way to go to pain management.  2. Symptom Management:   I met with patient in his home today. He was alert oriented and interactive. I did not meet any other household members but several friends came by.   Dyspnea: Patient outlined his biggest  issue being dyspnea. We reviewed his medication and he stated he could not take inhalers that had a powder. Upon looking into his supply he does have Symbicort and Dulera, and albuterol inhalers, none of which hes been using. He states he does not have nebulizers either. He does have a CPAP. I listened to his lungs, which were moving air poorly and had him take albuterol  inhaler. Afterward he did have some improved air movement. I provided education that he should take his maintenance inhaler twice daily and the albuterol inhaler for every four hours as needed.   Pain management:  He states he wants to go to a different pain management doctor because he can only get two pills a day. This does not relieve his pain. Patient likely has a lot of musculoskeletal pain as well as pain associated with chronic disease and heart failure.   Care management: We discussed his inability to be referred because hes not established with a PCP currently. I will reach out to our social work department to assist with establishing his PCP with Medicaid so that he can get referrals. Hes known to the EMT home visiting program.  3. Follow up Palliative Care Visit: Palliative care will continue to follow for goals of care clarification and symptom management. Return 2-3 weeks or prn.  4. Family /Caregiver/Community Supports: lives in apartment home. Supports uncertain. States he was discharged from [facility] but wants it back.  5. Cognitive / Functional decline: A and O x 3, needs assistance with adls and iadls.   I spent  75 minutes providing this consultation,  from 1500 to 1615. More than 50% of the time in this consultation was spent coordinating communication.   CODE STATUS:FULL  PPS: 50%  HOSPICE ELIGIBILITY/DIAGNOSIS: no  Subjective:  CHIEF COMPLAINT: dyspnea  HISTORY OF PRESENT ILLNESS:  [name] [name] is a 59 y.o. year old male  with alveolar hypoventilation, BMI 60 obesity, OA,COPD, CHF, h/o nstemi, dyspnea,  pain. He states dyspnea is worse since recent pneumonia. He is currently on 4 L oxygen and abx course to finish at home .   We are asked to consult around advance care planning and complex medical decision making.    Review and summarization of old Epic records shows or history from other than patient  shows history of hospitalizations roughly monthly for . COPD, septic shock, pneumonia, Review or lab tests, radiology,  or medicine shows low albumin at 2.7, negative C Difficile toxin report.  History obtained from review of EMR, discussion with primary team, and  interview with family, caregiver  and/or Mr. Shippy. Records reviewed and summarized above.   CURRENT PROBLEM LIST:  Patient Active Problem List   Diagnosis Date Noted   Community acquired pneumonia 12/25/2019   Shortness of breath 12/22/2019   Atrial fibrillation, chronic ([place]) 12/22/2019   Class 3 obesity with alveolar hypoventilation, serious comorbidity, and body mass index (BMI) of 60.0 to 69.9 in adult ([place]) 12/22/2019   GAD (generalized anxiety disorder) 10/12/2019   Alcohol withdrawal syndrome without complication ([place])    Lower extremity ulceration, unspecified laterality, with fat layer exposed ([place]) 08/09/2019   Alcohol abuse    Pressure injury of skin 06/26/2019   Iron deficiency anemia 06/22/2019   Depression 06/22/2019   Elevated troponin 06/22/2019   Left-sided Bell's palsy 05/08/2019   Chronic intractable headache 05/03/2019   Head injuries, initial encounter 05/03/2019   Noncompliance by refusing service 05/03/2019   Facial droop 05/02/2019   Pharmacologic therapy 04/11/2019   Disorder of skeletal system 04/11/2019   Problems influencing health status 04/11/2019   Chronic anticoagulation (warfarin   COUMADIN) 04/11/2019   Elevated sed rate 04/11/2019   Elevated C-reactive protein (CRP) 04/11/2019   Elevated hemoglobin A1c 04/11/2019   Hypoalbuminemia 04/11/2019   Edema due to  hypoalbuminemia 04/11/2019   Elevated brain natriuretic peptide (BNP) level 04/11/2019   Chronic hip pain (Right) 04/11/2019   Osteoarthritis of hip (Right) 04/11/2019   Degenerative joint disease of right hip 03/05/2019   Hypothyroidism 03/04/2019   Chronic venous stasis dermatitis of both lower extremities 03/04/2019   Long term (current) use of anticoagulants 02/23/2019   PE (pulmonary thromboembolism) ([place]) 01/21/2019   HLD (hyperlipidemia) 01/21/2019   CAD (coronary artery disease) 01/21/2019   Cellulitis of right lower extremity 01/21/2019   Noncompliance 01/09/2019   Acquired thrombophilia ([place]) 11/28/2018   Major depressive disorder, recurrent episode, in partial remission ([place]) 09/17/2018   Chest pain 08/06/2018   Personal history of DVT (deep vein thrombosis) 07/13/2018   History of pulmonary embolism (on Coumadin) 07/13/2018   Neck pain 07/13/2018   Chronic low back pain (Bilateral)  w/ sciatica (Bilateral) 07/13/2018   TBI (traumatic brain injury) ([place]) 07/04/2018   Abnormal thyroid blood test 06/27/2018   Type 2 diabetes mellitus with other specified complication ([place]) 45/36/4680   HTN (hypertension) 06/06/2018   Chronic gout involving toe, unspecified cause, unspecified laterality 06/06/2018   Morbid obesity with BMI of 60.0-69.9, adult ([place]) 06/06/2018   Chronic pain syndrome 06/06/2018   Ulcers of both lower extremities, limited to  breakdown of skin ([place]) 06/06/2018   Gout 06/06/2018   Persistent atrial fibrillation ([place])    Severe sepsis with septic shock ([place]) 03/17/2018   Centrilobular emphysema ([place]) 02/02/2018   OSA (obstructive sleep apnea) 02/02/2018   Lymphedema 02/02/2018   Acute on chronic respiratory failure with hypoxia ([place]) 01/27/2018   PAST MEDICAL HISTORY:  Active Ambulatory Problems    Diagnosis Date Noted   Acute on chronic respiratory failure with hypoxia ([place]) 01/27/2018   Centrilobular emphysema ([place])  02/02/2018   OSA (obstructive sleep apnea) 02/02/2018   Lymphedema 02/02/2018   Severe sepsis with septic shock ([place]) 03/17/2018   Persistent atrial fibrillation (HCC)    Type 2 diabetes mellitus with other specified complication ([place]) 67/59/1638   HTN (hypertension) 06/06/2018   Chronic gout involving toe, unspecified cause, unspecified laterality 06/06/2018   Morbid obesity with BMI of 60.0-69.9, adult ([place]) 06/06/2018   Chronic pain syndrome 06/06/2018   Ulcers of both lower extremities, limited to breakdown of skin ([place]) 06/06/2018   Abnormal thyroid blood test 06/27/2018   TBI (traumatic brain injury) ([place]) 07/04/2018   Personal history of DVT (deep vein thrombosis) 07/13/2018   History of pulmonary embolism (on Coumadin) 07/13/2018   Neck pain 07/13/2018   Chronic low back pain (Bilateral)  w/ sciatica (Bilateral) 07/13/2018   Chest pain 08/06/2018   Major depressive disorder, recurrent episode, in partial remission ([place]) 09/17/2018   Acquired thrombophilia ([place]) 11/28/2018   Noncompliance 01/09/2019   PE (pulmonary thromboembolism) ([place]) 01/21/2019   HLD (hyperlipidemia) 01/21/2019   CAD (coronary artery disease) 01/21/2019   Cellulitis of right lower extremity 01/21/2019   Long term (current) use of anticoagulants 02/23/2019   Hypothyroidism 03/04/2019   Chronic venous stasis dermatitis of both lower extremities 03/04/2019   Degenerative joint disease of right hip 03/05/2019   Pharmacologic therapy 04/11/2019   Disorder of skeletal system 04/11/2019   Problems influencing health status 04/11/2019   Chronic anticoagulation (warfarin   COUMADIN) 04/11/2019   Elevated sed rate 04/11/2019   Elevated C-reactive protein (CRP) 04/11/2019   Elevated hemoglobin A1c 04/11/2019   Hypoalbuminemia 04/11/2019   Edema due to hypoalbuminemia 04/11/2019   Elevated brain natriuretic peptide (BNP) level 04/11/2019   Chronic hip pain (Right)  04/11/2019   Osteoarthritis of hip (Right) 04/11/2019   Facial droop 05/02/2019   Chronic intractable headache 05/03/2019   Head injuries, initial encounter 05/03/2019   Noncompliance by refusing service 05/03/2019   Left-sided Bell's palsy 05/08/2019   Iron deficiency anemia 06/22/2019   Depression 06/22/2019   Elevated troponin 06/22/2019   Gout 06/06/2018   Pressure injury of skin 06/26/2019   Lower extremity ulceration, unspecified laterality, with fat layer exposed ([place]) 08/09/2019   Alcohol abuse    Alcohol withdrawal syndrome without complication (HCC)    GAD (generalized anxiety disorder) 10/12/2019   Shortness of breath 12/22/2019   Atrial fibrillation, chronic ([place]) 12/22/2019   Class 3 obesity with alveolar hypoventilation, serious comorbidity, and body mass index (BMI) of 60.0 to 69.9 in adult [facility]) 12/22/2019   Community acquired pneumonia 12/25/2019   Resolved Ambulatory Problems    Diagnosis Date Noted   Acute respiratory failure ([place]) 01/16/2018   Dyspnea    SOB (shortness of breath)    Acute on chronic diastolic CHF (congestive heart failure) ([place]) 02/02/2018   Chest pain 02/23/2018   Cellulitis 03/17/2018   Respiratory failure ([place]) 05/29/2018   Thoracic spine pain 07/13/2018   Chronic pain of both knees 07/13/2018   Acute on chronic  systolic CHF (congestive heart failure) ([place]) 02/02/2018   Atrial fibrillation with RVR ([place]) 03/08/2019   Acute pulmonary embolism ([place]) 11/01/2018   Subtherapeutic international normalized ratio (INR) 11/11/2018   Open leg wound 01/21/2019   Cellulitis 01/22/2019   Slurred speech 05/03/2019   Cellulitis due to methicillin-resistant Staphylococcus aureus (MRSA) 06/23/2019   NSTEMI (non-ST elevated myocardial infarction) ([place]) 08/09/2019   Acute on chronic diastolic CHF (congestive heart failure) ([place])    COPD exacerbation ([place]) 11/07/2019   Past Medical History:  Diagnosis Date    Allergy    Anxiety    Arthritis    Asthma    Brain damage    Clotting disorder (HCC)    COPD (chronic obstructive pulmonary disease) (HCC)    GERD (gastroesophageal reflux disease)    HFrEF (heart failure with reduced ejection fraction) (HCC)    History of DVT (deep vein thrombosis)    Hypertension    MI (myocardial infarction) ([place])    Morbid obesity (HCC)    NICM (nonischemic cardiomyopathy) ([place])    Sleep apnea    Sleep apnea    SOCIAL HX:  Social History   Tobacco Use   Smoking status: Never Smoker   Smokeless tobacco: Never Used  Substance Use Topics   Alcohol use: Yes   FAMILY HX:  Family History  Problem Relation Age of Onset   Heart failure Mother    Lung cancer Mother    Lung cancer Father    Heart attack Maternal Grandmother    Heart attack Maternal Grandfather       ALLERGIES: No Known Allergies   PERTINENT MEDICATIONS:  Outpatient Encounter Medications as of 01/18/2020  Medication Sig   albuterol (VENTOLIN HFA) 108 (90 Base) MCG/ACT inhaler Inhale 2 puffs into the lungs every 4 (four) hours as needed for wheezing or shortness of breath.   amiodarone (PACERONE) 200 MG tablet Take 1 tablet (200 mg total) by mouth 2 (two) times daily.   aspirin 325 MG tablet Take 325 mg by mouth daily.   atorvastatin (LIPITOR) 40 MG tablet TAKE 1 TABLET BY MOUTH ONCE DAILY (Patient taking differently: Take 40 mg by mouth daily. )   DULoxetine (CYMBALTA) 20 MG capsule Take 1 capsule (20 mg total) by mouth daily.   levothyroxine (SYNTHROID) 75 MCG tablet Take 1 tablet (75 mcg total) by mouth daily before breakfast.   warfarin (COUMADIN) 5 MG tablet Take 5 mg by mouth daily.    allopurinol (ZYLOPRIM) 100 MG tablet TAKE 1 TABLET BY MOUTH ONCE DAILY (Patient not taking: Reported on 01/18/2020)   ferrous sulfate 325 (65 FE) MG tablet Take 325 mg by mouth 2 (two) times daily.   fluticasone (FLONASE) 50 MCG/ACT nasal spray Place 2 sprays into both  nostrils 2 (two) times daily.    furosemide (LASIX) 40 MG tablet Take 1 tablet (40 mg total) by mouth 2 (two) times daily.   HYDROcodone-acetaminophen (NORCO) 7.5-325 MG tablet Take 1 tablet by mouth every 8 (eight) hours as needed for moderate pain or severe pain.   loperamide (IMODIUM) 2 MG capsule Take 1 capsule (2 mg total) by mouth as needed for diarrhea or loose stools.   magnesium oxide (MAG-OX) 400 (241.3 Mg) MG tablet Take 1 tablet (400 mg total) by mouth 2 (two) times daily.   metoprolol succinate (TOPROL-XL) 50 MG 24 hr tablet Take 2 tablets (100 mg total) by mouth daily. Take with or immediately following a meal.   montelukast (SINGULAIR) 10 MG tablet Take 1 tablet (  10 mg total) by mouth at bedtime.   Multiple Vitamin (MULTIVITAMIN WITH MINERALS) TABS tablet Take 1 tablet by mouth daily.   naphazoline-glycerin (CLEAR EYES REDNESS) 0.012-0.2 % SOLN Place 1-2 drops into both eyes 4 (four) times daily as needed for eye irritation.   nitroGLYCERIN (NITROSTAT) 0.4 MG SL tablet Place 1 tablet under tongue every 5 minutes as needed for chest pain. (No more than 3 doses within 15 minutes)   potassium chloride (KLOR-CON) 20 MEQ packet Take 40 mEq by mouth daily.   SUMAtriptan (IMITREX) 50 MG tablet Take 1 tablet (50 mg total) by mouth every 2 (two) hours as needed for migraine or headache. May repeat in 2 hours if headache persists or recurs. No more than 2 doses in a day   thiamine 100 MG tablet Take 1 tablet (100 mg total) by mouth daily.   traZODone (DESYREL) 150 MG tablet Take 1 tablet (150 mg total) by mouth at bedtime.   No facility-administered encounter medications on file as of 01/18/2020.    Objective: ROS  General: NAD EYES: denies vision changes ENMT: denies dysphagia Cardiovascular: endorses occ chest pain Pulmonary: endorses cough, endorses  increased SOB Abdomen: endorses good  appetite, endorses diarrhea GU: denies dysuria MSK:  endorses ROM limitations, no  falls reported Skin: denies rashes or wounds Neurological: endorses weakness, reports severe pain Psych: Endorses discouraged mood Heme/lymph/immuno: denies bruises, abnormal bleeding  Physical Exam: Current and past weights:435 lbs Constitutional:, NAD General: frail appearing, obese  EYES: anicteric sclera,lids intact, no discharge  ENMT: intact hearing,oral mucous membranes moist, dentition intact CV: S1S2, RRR  Pulmonary: Lungs with poor air movement, + increased work of breathing, no cough, no audible wheezes, oxygen at 4 L  Abdomen: intake 100%,  no ascites MSK: decreased ROM in all extremities, no contractures of LE, difficulty with ambulation Skin: warm and dry, no rashes or wounds on visible skin Neuro: Generalized weakness, no cognitive impairment, grossly non -focal Psych: slightly anxious affect, A and O x 3 Hem/lymph/immuno: no widespread bruising   Thank you for the opportunity to participate in the care of Mr. Baggerly.  The palliative care team will continue to follow. Please call our office at [phone] if we can be of additional assistance.  Jason Coop, NP , DNP, MPH, AGPCNP-BC, ACHPN  COVID-19 PATIENT SCREENING TOOL  Person answering questions: ____________self______ _____   1.  Is the patient or any family member in the home showing any signs or symptoms regarding respiratory infection?               Person with Symptom- __________NA_________________  a. Fever                                                                          Yes___ No___          ___________________  b. Shortness of breath                                                    Yes___ No___  ___________________ c. Cough/congestion                                       Yes___  No___         ___________________ d. Body aches/pains                                                         Yes___ No___        ____________________ e. Gastrointestinal symptoms (diarrhea,  nausea)           Yes___ No___        ____________________  2. Within the past 14 days, has anyone living in the home had any contact with someone with or under investigation for COVID-19?    Yes___ No_X_   Person __________________

## 2020-01-22 ENCOUNTER — Telehealth: Payer: Self-pay | Admitting: Primary Care

## 2020-01-22 ENCOUNTER — Telehealth (HOSPITAL_COMMUNITY): Payer: Self-pay

## 2020-01-22 NOTE — Telephone Encounter (Signed)
After receiving a phone call from Palliative care K.Smith advising that [name] [name] is wanting me to call him I did.  He has slurred speech during the conversation and admits to a fifth of liquor today.  He states has not eaten.  He is asking for pain meds or can I refer him to a pain doctor.  Advised him only his PCP can refer him or give him pain meds.  He states he wants a new pcp because he will not refer him or give him any.  Advised him he needs to eat something.  He states he dont feel like it.  Asked if he needs to go to ED, he refuses.  He states he is ok by his self.  He able to answer questions appropriately.  Does not appear to harm to his self.  Will follow up with his PCP tomorrow to see if he can referred to a pain doctor.    [name] Imogene EMT-Paramedic [phone]

## 2020-01-22 NOTE — Telephone Encounter (Signed)
Patient called with questions for NP to refer to pain management and to get in touch with Llana Aliment, EMT. Call to Ms Maryclare Bean to refer message for contact. She will call patient, and will also pass along that referrals must come from PCP, as discussed in patient visit last week.

## 2020-01-23 ENCOUNTER — Ambulatory Visit: Payer: Medicaid Other | Admitting: Pharmacist

## 2020-01-23 DIAGNOSIS — J432 Centrilobular emphysema: Secondary | ICD-10-CM

## 2020-01-23 NOTE — Chronic Care Management (AMB) (Signed)
  Care Management   Follow Up Note   01/23/2020 Name: Moise Friday MRN: [id number] DOB: 1960/06/27  Crispin Vogel is enrolled in a Managed Medicaid plan: Yes. Outreach attempt today was successful.    Referred by: [name], DO Reason for referral : Chronic Care Management (Patient Phone Call)   Oziah Vitanza is a 59 y.o. year old male who is a primary care patient of [name], DO. The care management team was consulted for assistance with care management and care coordination needs.    Review of patient status, including review of consultants reports, relevant laboratory and other test results, and collaboration with appropriate care team members and the patient's provider was performed as part of comprehensive patient evaluation and provision of chronic care management services.    Goals Addressed            This Visit's Progress   . PharmD - Medication Management       CARE PLAN ENTRY (see longitudinal plan of care for additional care plan information)   Current Barriers:  . Chronic Disease Management support, education, and care coordination needs related to T2DM, persistent atrial fibrillation, HFrEF, morbid obesity, COPD, depression, hypothyroidism and gout . Financial Barriers  Pharmacist Clinical Goal(s):  [name] Over the next 30 days, patient will work with CM Pharmacist and PCP to address needs related to medication adherence  Interventions: . Perform chart review  o Patient admitted to [facility] 11/22 to 11/27 and 11/6 to 11/12 related to sepsis o Patient last seen by [facility] palliative care on 12/3   . Follow up with patient today regarding dyspnea/medication management.  o Per note from palliative care, NP noted patient to have multiple maintenance inhalers, including Symbicort and Dulera, as well as albuterol rescue inhaler in home. o Attempt to review inhalers with patient to confirm which he is using/provide  education, but patient declines . Patient declines to complete medication review . Patient declines need for future outreach from [facility] Pharmacist . Will close patient to [facility] services and will notify both PCP and CM team.  Patient Self Care Activities:  . Does not adhere to prescribed medication regimen   Please see past updates related to this goal by clicking on the "Past Updates" button in the selected goal         Plan  CM Pharmacist is available to follow up with the patient after provider conversation with the patient regarding recommendation for care management engagement and subsequent re-referral to the care management team.   Duanne Moron, PharmD, [facility] Clinical Pharmacist [facility] [employer] [phone]

## 2020-01-23 NOTE — Patient Instructions (Signed)
Thank you allowing the Care Management Team to be a part of your care! It was a pleasure speaking with you today!     Care Management Team    Alto Denver RN, MSN, CCM Nurse Care Coordinator  [phone]   Duanne Moron PharmD  Clinical Pharmacist  [phone]   [name] LCSW Clinical Social Worker [phone]   Visit Information  Goals Addressed            This Visit's Progress   . PharmD - Medication Management       CARE PLAN ENTRY (see longitudinal plan of care for additional care plan information)   Current Barriers:  . Chronic Disease Management support, education, and care coordination needs related to T2DM, persistent atrial fibrillation, HFrEF, morbid obesity, COPD, depression, hypothyroidism and gout . Financial Barriers  Pharmacist Clinical Goal(s):  [name] Over the next 30 days, patient will work with CM Pharmacist and PCP to address needs related to medication adherence  Interventions: . Perform chart review  o Patient admitted to [facility] 11/22 to 11/27 and 11/6 to 11/12 related to sepsis o Patient last seen by [facility] palliative care on 12/3   . Follow up with patient today regarding dyspnea/medication management.  o Per note from palliative care, NP noted patient to have multiple maintenance inhalers, including Symbicort and Dulera, as well as albuterol rescue inhaler in home. o Attempt to review inhalers with patient to confirm which he is using/provide education, but patient declines . Patient declines to complete medication review . Patient declines need for future outreach from [facility] Pharmacist . Will close patient to [facility] services and will notify both PCP and CM team.  Patient Self Care Activities:  . Does not adhere to prescribed medication regimen   Please see past updates related to this goal by clicking on the "Past Updates" button in the selected goal         The patient verbalized understanding of  instructions, educational materials, and care plan provided today and declined offer to receive copy of patient instructions, educational materials, and care plan.   CM Pharmacist is available to follow up with the patient after provider conversation with the patient regarding recommendation for care management engagement and subsequent re-referral to the care management team.   Duanne Moron, PharmD, [facility] Clinical Pharmacist [facility] [phone]

## 2020-01-25 ENCOUNTER — Ambulatory Visit: Payer: Medicaid Other | Admitting: Physician Assistant

## 2020-01-27 ENCOUNTER — Inpatient Hospital Stay: Payer: Medicaid Other

## 2020-01-27 ENCOUNTER — Inpatient Hospital Stay
Admission: EM | Admit: 2020-01-27 | Discharge: 2020-02-02 | DRG: 291 | Disposition: A | Discharge status: Home Health | Payer: Medicaid Other | Attending: Internal Medicine | Admitting: Internal Medicine

## 2020-01-27 ENCOUNTER — Emergency Department: Payer: Medicaid Other

## 2020-01-27 ENCOUNTER — Other Ambulatory Visit: Payer: Self-pay

## 2020-01-27 DIAGNOSIS — I110 Hypertensive heart disease with heart failure: Secondary | ICD-10-CM | POA: Diagnosis not present

## 2020-01-27 DIAGNOSIS — Z7901 Long term (current) use of anticoagulants: Secondary | ICD-10-CM

## 2020-01-27 DIAGNOSIS — Z79890 Hormone replacement therapy (postmenopausal): Secondary | ICD-10-CM

## 2020-01-27 DIAGNOSIS — E039 Hypothyroidism, unspecified: Secondary | ICD-10-CM | POA: Diagnosis present

## 2020-01-27 DIAGNOSIS — Z86711 Personal history of pulmonary embolism: Secondary | ICD-10-CM | POA: Diagnosis not present

## 2020-01-27 DIAGNOSIS — F10930 Alcohol use, unspecified with withdrawal, uncomplicated: Secondary | ICD-10-CM | POA: Diagnosis present

## 2020-01-27 DIAGNOSIS — E876 Hypokalemia: Secondary | ICD-10-CM | POA: Diagnosis present

## 2020-01-27 DIAGNOSIS — J449 Chronic obstructive pulmonary disease, unspecified: Secondary | ICD-10-CM | POA: Diagnosis present

## 2020-01-27 DIAGNOSIS — I4819 Other persistent atrial fibrillation: Secondary | ICD-10-CM | POA: Diagnosis present

## 2020-01-27 DIAGNOSIS — J9621 Acute and chronic respiratory failure with hypoxia: Secondary | ICD-10-CM | POA: Diagnosis present

## 2020-01-27 DIAGNOSIS — D688 Other specified coagulation defects: Secondary | ICD-10-CM | POA: Diagnosis not present

## 2020-01-27 DIAGNOSIS — R791 Abnormal coagulation profile: Secondary | ICD-10-CM | POA: Diagnosis present

## 2020-01-27 DIAGNOSIS — G8929 Other chronic pain: Secondary | ICD-10-CM | POA: Diagnosis present

## 2020-01-27 DIAGNOSIS — I248 Other forms of acute ischemic heart disease: Secondary | ICD-10-CM | POA: Diagnosis present

## 2020-01-27 DIAGNOSIS — I4891 Unspecified atrial fibrillation: Secondary | ICD-10-CM | POA: Diagnosis present

## 2020-01-27 DIAGNOSIS — I482 Chronic atrial fibrillation, unspecified: Secondary | ICD-10-CM

## 2020-01-27 DIAGNOSIS — Z8249 Family history of ischemic heart disease and other diseases of the circulatory system: Secondary | ICD-10-CM

## 2020-01-27 DIAGNOSIS — R0602 Shortness of breath: Secondary | ICD-10-CM | POA: Diagnosis not present

## 2020-01-27 DIAGNOSIS — F10230 Alcohol dependence with withdrawal, uncomplicated: Secondary | ICD-10-CM | POA: Diagnosis present

## 2020-01-27 DIAGNOSIS — Z7982 Long term (current) use of aspirin: Secondary | ICD-10-CM

## 2020-01-27 DIAGNOSIS — I2510 Atherosclerotic heart disease of native coronary artery without angina pectoris: Secondary | ICD-10-CM | POA: Diagnosis present

## 2020-01-27 DIAGNOSIS — Z86718 Personal history of other venous thrombosis and embolism: Secondary | ICD-10-CM

## 2020-01-27 DIAGNOSIS — I10 Essential (primary) hypertension: Secondary | ICD-10-CM | POA: Diagnosis present

## 2020-01-27 DIAGNOSIS — I2699 Other pulmonary embolism without acute cor pulmonale: Secondary | ICD-10-CM | POA: Diagnosis not present

## 2020-01-27 DIAGNOSIS — I517 Cardiomegaly: Secondary | ICD-10-CM | POA: Diagnosis not present

## 2020-01-27 DIAGNOSIS — M25512 Pain in left shoulder: Secondary | ICD-10-CM | POA: Diagnosis present

## 2020-01-27 DIAGNOSIS — J9622 Acute and chronic respiratory failure with hypercapnia: Secondary | ICD-10-CM | POA: Diagnosis present

## 2020-01-27 DIAGNOSIS — G4733 Obstructive sleep apnea (adult) (pediatric): Secondary | ICD-10-CM | POA: Diagnosis present

## 2020-01-27 DIAGNOSIS — G4700 Insomnia, unspecified: Secondary | ICD-10-CM | POA: Diagnosis present

## 2020-01-27 DIAGNOSIS — Z6841 Body Mass Index (BMI) 40.0 and over, adult: Secondary | ICD-10-CM

## 2020-01-27 DIAGNOSIS — I509 Heart failure, unspecified: Secondary | ICD-10-CM | POA: Diagnosis not present

## 2020-01-27 DIAGNOSIS — Z9981 Dependence on supplemental oxygen: Secondary | ICD-10-CM

## 2020-01-27 DIAGNOSIS — L89899 Pressure ulcer of other site, unspecified stage: Secondary | ICD-10-CM | POA: Diagnosis present

## 2020-01-27 DIAGNOSIS — I5033 Acute on chronic diastolic (congestive) heart failure: Secondary | ICD-10-CM | POA: Diagnosis present

## 2020-01-27 DIAGNOSIS — R069 Unspecified abnormalities of breathing: Secondary | ICD-10-CM | POA: Diagnosis not present

## 2020-01-27 DIAGNOSIS — I428 Other cardiomyopathies: Secondary | ICD-10-CM | POA: Diagnosis present

## 2020-01-27 DIAGNOSIS — R0689 Other abnormalities of breathing: Secondary | ICD-10-CM | POA: Diagnosis not present

## 2020-01-27 DIAGNOSIS — I447 Left bundle-branch block, unspecified: Secondary | ICD-10-CM | POA: Diagnosis not present

## 2020-01-27 DIAGNOSIS — Z20822 Contact with and (suspected) exposure to covid-19: Secondary | ICD-10-CM | POA: Diagnosis present

## 2020-01-27 DIAGNOSIS — I252 Old myocardial infarction: Secondary | ICD-10-CM

## 2020-01-27 DIAGNOSIS — Z8614 Personal history of Methicillin resistant Staphylococcus aureus infection: Secondary | ICD-10-CM

## 2020-01-27 DIAGNOSIS — E119 Type 2 diabetes mellitus without complications: Secondary | ICD-10-CM | POA: Diagnosis present

## 2020-01-27 DIAGNOSIS — I890 Lymphedema, not elsewhere classified: Secondary | ICD-10-CM | POA: Diagnosis present

## 2020-01-27 DIAGNOSIS — I872 Venous insufficiency (chronic) (peripheral): Secondary | ICD-10-CM | POA: Diagnosis present

## 2020-01-27 DIAGNOSIS — Z79899 Other long term (current) drug therapy: Secondary | ICD-10-CM

## 2020-01-27 DIAGNOSIS — M1A9XX0 Chronic gout, unspecified, without tophus (tophi): Secondary | ICD-10-CM | POA: Diagnosis present

## 2020-01-27 DIAGNOSIS — Z9111 Patient's noncompliance with dietary regimen: Secondary | ICD-10-CM

## 2020-01-27 DIAGNOSIS — Z993 Dependence on wheelchair: Secondary | ICD-10-CM

## 2020-01-27 DIAGNOSIS — R0902 Hypoxemia: Secondary | ICD-10-CM | POA: Diagnosis not present

## 2020-01-27 DIAGNOSIS — R0600 Dyspnea, unspecified: Secondary | ICD-10-CM | POA: Diagnosis not present

## 2020-01-27 LAB — CBC WITH DIFFERENTIAL/PLATELET
Abs Immature Granulocytes: 0.05 10*3/uL (ref 0.00–0.07)
Basophils Absolute: 0.1 10*3/uL (ref 0.0–0.1)
Basophils Relative: 1 %
Eosinophils Absolute: 0.2 10*3/uL (ref 0.0–0.5)
Eosinophils Relative: 2 %
HCT: 37.8 % — ABNORMAL LOW (ref 39.0–52.0)
Hemoglobin: 12 g/dL — ABNORMAL LOW (ref 13.0–17.0)
Immature Granulocytes: 1 %
Lymphocytes Relative: 14 %
Lymphs Abs: 1.2 10*3/uL (ref 0.7–4.0)
MCH: 29.3 pg (ref 26.0–34.0)
MCHC: 31.7 g/dL (ref 30.0–36.0)
MCV: 92.4 fL (ref 80.0–100.0)
Monocytes Absolute: 0.9 10*3/uL (ref 0.1–1.0)
Monocytes Relative: 11 %
Neutro Abs: 5.9 10*3/uL (ref 1.7–7.7)
Neutrophils Relative %: 71 %
Platelets: 296 10*3/uL (ref 150–400)
RBC: 4.09 MIL/uL — ABNORMAL LOW (ref 4.22–5.81)
RDW: 16.8 % — ABNORMAL HIGH (ref 11.5–15.5)
WBC: 8.4 10*3/uL (ref 4.0–10.5)
nRBC: 0 % (ref 0.0–0.2)

## 2020-01-27 LAB — RESP PANEL BY RT-PCR (FLU A&B, COVID) ARPGX2
Influenza A by PCR: NEGATIVE
Influenza B by PCR: NEGATIVE
SARS Coronavirus 2 by RT PCR: NEGATIVE

## 2020-01-27 LAB — COMPREHENSIVE METABOLIC PANEL
ALT: 31 U/L (ref 0–44)
AST: 57 U/L — ABNORMAL HIGH (ref 15–41)
Albumin: 3.3 g/dL — ABNORMAL LOW (ref 3.5–5.0)
Alkaline Phosphatase: 107 U/L (ref 38–126)
Anion gap: 17 — ABNORMAL HIGH (ref 5–15)
BUN: 6 mg/dL (ref 6–20)
CO2: 25 mmol/L (ref 22–32)
Calcium: 7.8 mg/dL — ABNORMAL LOW (ref 8.9–10.3)
Chloride: 94 mmol/L — ABNORMAL LOW (ref 98–111)
Creatinine, Ser: 0.94 mg/dL (ref 0.61–1.24)
GFR, Estimated: >60 mL/min (ref >60)
Glucose, Bld: 172 mg/dL — ABNORMAL HIGH (ref 70–99)
Potassium: 3.7 mmol/L (ref 3.5–5.1)
Sodium: 136 mmol/L (ref 135–145)
Total Bilirubin: 1 mg/dL (ref 0.3–1.2)
Total Protein: 7.2 g/dL (ref 6.5–8.1)

## 2020-01-27 LAB — MAGNESIUM: Magnesium: 1.5 mg/dL — ABNORMAL LOW (ref 1.7–2.4)

## 2020-01-27 LAB — CBG MONITORING, ED
Glucose-Capillary: 136 mg/dL — ABNORMAL HIGH (ref 70–99)
Glucose-Capillary: 121 mg/dL — ABNORMAL HIGH (ref 70–99)
Glucose-Capillary: 138 mg/dL — ABNORMAL HIGH (ref 70–99)

## 2020-01-27 LAB — HIV ANTIBODY (ROUTINE TESTING W REFLEX): HIV Screen 4th Generation wRfx: NONREACTIVE

## 2020-01-27 LAB — TSH: TSH: 12.805 u[IU]/mL — ABNORMAL HIGH (ref 0.350–4.500)

## 2020-01-27 LAB — TROPONIN I (HIGH SENSITIVITY)
Troponin I (High Sensitivity): 62 ng/L — ABNORMAL HIGH (ref <18)
Troponin I (High Sensitivity): 68 ng/L — ABNORMAL HIGH (ref <18)
Troponin I (High Sensitivity): 91 ng/L — ABNORMAL HIGH (ref <18)

## 2020-01-27 LAB — PROTIME-INR
INR: 1.2 (ref 0.8–1.2)
Prothrombin Time: 14.9 seconds (ref 11.4–15.2)

## 2020-01-27 LAB — HEMOGLOBIN A1C
Hgb A1c MFr Bld: 6.7 % — ABNORMAL HIGH (ref 4.8–5.6)
Mean Plasma Glucose: 145.59 mg/dL

## 2020-01-27 LAB — ETHANOL: Alcohol, Ethyl (B): <10 mg/dL (ref <10)

## 2020-01-27 LAB — BRAIN NATRIURETIC PEPTIDE: B Natriuretic Peptide: 279.8 pg/mL — ABNORMAL HIGH (ref 0.0–100.0)

## 2020-01-27 LAB — PHOSPHORUS: Phosphorus: 2.3 mg/dL — ABNORMAL LOW (ref 2.5–4.6)

## 2020-01-27 MED ORDER — LORAZEPAM 2 MG/ML IJ SOLN
1.0000 mg | Freq: Once | INTRAMUSCULAR | Status: AC
  Administered 2020-01-27: 1 mg via INTRAVENOUS

## 2020-01-27 MED ORDER — THIAMINE HCL 100 MG PO TABS
100.0000 mg | ORAL_TABLET | Freq: Every day | ORAL | Status: DC
  Administered 2020-01-28 – 2020-02-02 (×6): 100 mg via ORAL
  Filled 2020-01-27 (×7): qty 1

## 2020-01-27 MED ORDER — SODIUM CHLORIDE 0.9% FLUSH
3.0000 mL | Freq: Two times a day (BID) | INTRAVENOUS | Status: DC
  Administered 2020-01-28 – 2020-02-02 (×10): 3 mL via INTRAVENOUS

## 2020-01-27 MED ORDER — ALBUTEROL SULFATE HFA 108 (90 BASE) MCG/ACT IN AERS
2.0000 | INHALATION_SPRAY | RESPIRATORY_TRACT | Status: DC | PRN
  Administered 2020-01-27 – 2020-01-28 (×2): 2 via RESPIRATORY_TRACT
  Filled 2020-01-27 (×3): qty 6.7

## 2020-01-27 MED ORDER — IOHEXOL 350 MG/ML SOLN
100.0000 mL | Freq: Once | INTRAVENOUS | Status: AC | PRN
  Administered 2020-01-27: 11:00:00 100 mL via INTRAVENOUS

## 2020-01-27 MED ORDER — TRAZODONE HCL 50 MG PO TABS
150.0000 mg | ORAL_TABLET | Freq: Every day | ORAL | Status: DC
  Administered 2020-01-27 – 2020-02-01 (×6): 150 mg via ORAL
  Filled 2020-01-27 (×6): qty 1

## 2020-01-27 MED ORDER — LEVOTHYROXINE SODIUM 50 MCG PO TABS
75.0000 ug | ORAL_TABLET | Freq: Every day | ORAL | Status: DC
  Administered 2020-01-27 – 2020-02-02 (×7): 75 ug via ORAL
  Filled 2020-01-27: qty 2
  Filled 2020-01-27 (×2): qty 1
  Filled 2020-01-27: qty 2
  Filled 2020-01-27 (×3): qty 1

## 2020-01-27 MED ORDER — FOLIC ACID 1 MG PO TABS
1.0000 mg | ORAL_TABLET | Freq: Every day | ORAL | Status: DC
  Administered 2020-01-27 – 2020-02-02 (×7): 1 mg via ORAL
  Filled 2020-01-27 (×8): qty 1

## 2020-01-27 MED ORDER — SODIUM CHLORIDE 0.9 % IV SOLN: 250.0000 mL | INTRAVENOUS | Status: DC | PRN

## 2020-01-27 MED ORDER — ONDANSETRON HCL 4 MG/2ML IJ SOLN
4.0000 mg | Freq: Four times a day (QID) | INTRAMUSCULAR | Status: DC | PRN
  Administered 2020-01-27: 12:00:00 4 mg via INTRAVENOUS
  Filled 2020-01-27: qty 2

## 2020-01-27 MED ORDER — FERROUS SULFATE 325 (65 FE) MG PO TABS
325.0000 mg | ORAL_TABLET | Freq: Two times a day (BID) | ORAL | Status: DC
  Administered 2020-01-27 – 2020-02-02 (×13): 325 mg via ORAL
  Filled 2020-01-27 (×15): qty 1

## 2020-01-27 MED ORDER — LORAZEPAM 2 MG/ML IJ SOLN
0.0000 mg | Freq: Four times a day (QID) | INTRAMUSCULAR | Status: AC
  Filled 2020-01-27: qty 1

## 2020-01-27 MED ORDER — PROMETHAZINE HCL 25 MG/ML IJ SOLN
12.5000 mg | Freq: Once | INTRAMUSCULAR | Status: AC
  Administered 2020-01-27: 05:00:00 12.5 mg via INTRAVENOUS

## 2020-01-27 MED ORDER — MONTELUKAST SODIUM 10 MG PO TABS
10.0000 mg | ORAL_TABLET | Freq: Every day | ORAL | Status: DC
  Administered 2020-01-27 – 2020-02-01 (×6): 10 mg via ORAL
  Filled 2020-01-27 (×7): qty 1

## 2020-01-27 MED ORDER — WARFARIN - PHARMACIST DOSING INPATIENT
Freq: Every day | Status: DC
  Filled 2020-01-27: qty 1

## 2020-01-27 MED ORDER — FLUTICASONE PROPIONATE 50 MCG/ACT NA SUSP
2.0000 | Freq: Two times a day (BID) | NASAL | Status: DC
  Administered 2020-01-27 – 2020-02-02 (×9): 2 via NASAL
  Filled 2020-01-27: qty 16

## 2020-01-27 MED ORDER — ASPIRIN 81 MG PO CHEW
81.0000 mg | CHEWABLE_TABLET | Freq: Every day | ORAL | Status: DC
  Administered 2020-01-27 – 2020-02-02 (×7): 81 mg via ORAL
  Filled 2020-01-27 (×7): qty 1

## 2020-01-27 MED ORDER — LORAZEPAM 2 MG/ML IJ SOLN: 1.0000 mg | Freq: Once | INTRAMUSCULAR | Status: DC

## 2020-01-27 MED ORDER — LORAZEPAM 2 MG/ML IJ SOLN: 0.0000 mg | Freq: Two times a day (BID) | INTRAMUSCULAR | Status: AC

## 2020-01-27 MED ORDER — FUROSEMIDE 10 MG/ML IJ SOLN
40.0000 mg | Freq: Once | INTRAMUSCULAR | Status: AC
  Administered 2020-01-27: 04:00:00 40 mg via INTRAVENOUS
  Filled 2020-01-27: qty 4

## 2020-01-27 MED ORDER — INSULIN ASPART 100 UNIT/ML ~~LOC~~ SOLN
0.0000 [IU] | Freq: Three times a day (TID) | SUBCUTANEOUS | Status: DC
  Administered 2020-01-27 – 2020-02-01 (×10): 2 [IU] via SUBCUTANEOUS
  Administered 2020-02-01: 18:00:00 3 [IU] via SUBCUTANEOUS
  Administered 2020-02-02: 10:00:00 2 [IU] via SUBCUTANEOUS
  Filled 2020-01-27 (×12): qty 1

## 2020-01-27 MED ORDER — LOPERAMIDE HCL 2 MG PO CAPS: 2.0000 mg | ORAL_CAPSULE | ORAL | Status: DC | PRN

## 2020-01-27 MED ORDER — HYDROCODONE-ACETAMINOPHEN 7.5-325 MG PO TABS
1.0000 | ORAL_TABLET | Freq: Three times a day (TID) | ORAL | Status: DC | PRN
  Administered 2020-01-28: 2 via ORAL
  Administered 2020-01-28: 1 via ORAL
  Administered 2020-01-29 – 2020-01-31 (×6): 2 via ORAL
  Filled 2020-01-27 (×6): qty 2
  Filled 2020-01-27 (×3): qty 1

## 2020-01-27 MED ORDER — THIAMINE HCL 100 MG PO TABS: 100.0000 mg | ORAL_TABLET | Freq: Every day | ORAL | Status: DC

## 2020-01-27 MED ORDER — IPRATROPIUM-ALBUTEROL 0.5-2.5 (3) MG/3ML IN SOLN: 3.0000 mL | Freq: Four times a day (QID) | RESPIRATORY_TRACT | Status: DC | PRN

## 2020-01-27 MED ORDER — ONDANSETRON HCL 4 MG/2ML IJ SOLN: 4.0000 mg | Freq: Once | INTRAMUSCULAR | Status: AC

## 2020-01-27 MED ORDER — SODIUM CHLORIDE 0.9% FLUSH
3.0000 mL | INTRAVENOUS | Status: DC | PRN
Start: 2020-01-27 — End: 2020-02-02

## 2020-01-27 MED ORDER — POTASSIUM CHLORIDE 20 MEQ PO PACK
40.0000 meq | PACK | Freq: Every day | ORAL | Status: DC
  Administered 2020-01-27 – 2020-01-30 (×4): 40 meq via ORAL
  Filled 2020-01-27 (×4): qty 2

## 2020-01-27 MED ORDER — ACETAMINOPHEN 325 MG PO TABS
650.0000 mg | ORAL_TABLET | ORAL | Status: DC | PRN
  Administered 2020-01-27 – 2020-01-31 (×4): 650 mg via ORAL
  Filled 2020-01-27 (×4): qty 2

## 2020-01-27 MED ORDER — METOPROLOL SUCCINATE ER 100 MG PO TB24
100.0000 mg | ORAL_TABLET | Freq: Every day | ORAL | Status: DC
  Administered 2020-01-27 – 2020-02-02 (×6): 100 mg via ORAL
  Filled 2020-01-27 (×2): qty 1
  Filled 2020-01-27 (×2): qty 2
  Filled 2020-01-27 (×3): qty 1

## 2020-01-27 MED ORDER — WARFARIN SODIUM 7.5 MG PO TABS
7.5000 mg | ORAL_TABLET | Freq: Once | ORAL | Status: AC
  Administered 2020-01-27: 21:00:00 7.5 mg via ORAL
  Filled 2020-01-27 (×2): qty 1

## 2020-01-27 MED ORDER — LORAZEPAM 2 MG/ML IJ SOLN: 1.0000 mg | INTRAMUSCULAR | Status: DC | PRN

## 2020-01-27 MED ORDER — INSULIN ASPART 100 UNIT/ML ~~LOC~~ SOLN
0.0000 [IU] | Freq: Three times a day (TID) | SUBCUTANEOUS | Status: DC
  Administered 2020-01-27: 10:00:00 3 [IU] via SUBCUTANEOUS

## 2020-01-27 MED ORDER — INSULIN ASPART 100 UNIT/ML ~~LOC~~ SOLN: 0.0000 [IU] | Freq: Every day | SUBCUTANEOUS | Status: DC

## 2020-01-27 MED ORDER — ATORVASTATIN CALCIUM 20 MG PO TABS
40.0000 mg | ORAL_TABLET | Freq: Every day | ORAL | Status: DC
  Administered 2020-01-27 – 2020-02-02 (×7): 40 mg via ORAL
  Filled 2020-01-27 (×7): qty 2

## 2020-01-27 MED ORDER — LORAZEPAM 2 MG/ML IJ SOLN
0.0000 mg | Freq: Four times a day (QID) | INTRAMUSCULAR | Status: AC
  Administered 2020-01-27: 10:00:00 2 mg via INTRAVENOUS
  Filled 2020-01-27: qty 1

## 2020-01-27 MED ORDER — ADULT MULTIVITAMIN W/MINERALS CH
1.0000 | ORAL_TABLET | Freq: Every day | ORAL | Status: DC
  Administered 2020-01-27 – 2020-02-02 (×7): 1 via ORAL
  Filled 2020-01-27 (×7): qty 1

## 2020-01-27 MED ORDER — FUROSEMIDE 10 MG/ML IJ SOLN
40.0000 mg | Freq: Two times a day (BID) | INTRAMUSCULAR | Status: DC
  Administered 2020-01-27 – 2020-01-28 (×2): 40 mg via INTRAVENOUS
  Filled 2020-01-27 (×2): qty 4

## 2020-01-27 MED ORDER — MAGNESIUM OXIDE 400 (241.3 MG) MG PO TABS
400.0000 mg | ORAL_TABLET | Freq: Two times a day (BID) | ORAL | Status: DC
  Administered 2020-01-27 – 2020-02-02 (×13): 400 mg via ORAL
  Filled 2020-01-27 (×13): qty 1

## 2020-01-27 MED ORDER — ONDANSETRON HCL 4 MG/2ML IJ SOLN
INTRAMUSCULAR | Status: AC
  Administered 2020-01-27: 03:00:00 4 mg via INTRAVENOUS
  Filled 2020-01-27: qty 2

## 2020-01-27 MED ORDER — AMIODARONE HCL 200 MG PO TABS
200.0000 mg | ORAL_TABLET | Freq: Two times a day (BID) | ORAL | Status: DC
  Administered 2020-01-27 – 2020-02-02 (×13): 200 mg via ORAL
  Filled 2020-01-27 (×15): qty 1

## 2020-01-27 MED ORDER — HYDRALAZINE HCL 25 MG PO TABS: 25.0000 mg | ORAL_TABLET | Freq: Four times a day (QID) | ORAL | Status: DC | PRN

## 2020-01-27 MED ORDER — ENOXAPARIN SODIUM 300 MG/3ML IJ SOLN
1.0000 mg/kg | Freq: Two times a day (BID) | INTRAMUSCULAR | Status: DC
  Administered 2020-01-27 – 2020-01-30 (×7): 185 mg via SUBCUTANEOUS
  Filled 2020-01-27 (×11): qty 1.85

## 2020-01-27 MED ORDER — HYDROCODONE-ACETAMINOPHEN 7.5-325 MG PO TABS
1.0000 | ORAL_TABLET | Freq: Three times a day (TID) | ORAL | Status: DC | PRN
  Administered 2020-01-27 (×2): 1 via ORAL
  Filled 2020-01-27 (×2): qty 1

## 2020-01-27 MED ORDER — THIAMINE HCL 100 MG/ML IJ SOLN
100.0000 mg | Freq: Every day | INTRAMUSCULAR | Status: DC
  Administered 2020-01-27: 11:00:00 100 mg via INTRAVENOUS
  Filled 2020-01-27: qty 2

## 2020-01-27 MED ORDER — DULOXETINE HCL 20 MG PO CPEP
20.0000 mg | ORAL_CAPSULE | Freq: Every day | ORAL | Status: DC
  Administered 2020-01-27 – 2020-02-01 (×6): 20 mg via ORAL
  Filled 2020-01-27 (×7): qty 1

## 2020-01-27 MED ORDER — LORAZEPAM 1 MG PO TABS: 1.0000 mg | ORAL_TABLET | ORAL | Status: DC | PRN

## 2020-01-27 NOTE — ED Notes (Signed)
Pt to standing position with walker and transitioned onto hospital bed. Covered with warm blankets. Denies further needs at this time.

## 2020-01-27 NOTE — ED Notes (Signed)
Pt requesting assistance to use urinal. Due to pt anatomy, urinal placement difficult and pt had already begun urinating. Bedding and pt changed/cleansed. Pt requests fracture pan to urinate again, misses and urinates on floor. Floor cleaned. Pt repositioned in bed, socks changed.   Awaiting warfarin from pharmacy

## 2020-01-27 NOTE — Progress Notes (Signed)
PROGRESS NOTE    [name] [name]  JGG:[id number] DOB: 1960/12/28 DOA: 01/27/2020 PCP: [name], DO  Outpatient Specialists: [facility]    Brief Narrative:   [name] [name] is a 59 y.o. male with medical history significant for diastolic CHF, COPD on home O2 at 3 L, OSA on CPAP, morbid obesity, severe alcohol use disorder, chronic A. fib, history of PE on Coumadin, who presents to the emergency room with sudden onset shortness of breath that woke him up from sleep about 2 hours prior to arrival in the ER.  He has associated retrosternal chest tightness of moderate intensity, nonradiating, with no aggravating or alleviating factors.  Has Associated nausea but without vomiting or diaphoresis.  Denies cough, fever or chills.  Denies abdominal pain vomiting and diarrhea While in the emergency room patient was noted to have a mild tremor on CIWA protocol was initiated.  He was treated with IV Lasix for CHF exacerbation.  Hospitalist consulted for admission.   Assessment & Plan:   Principal Problem:   Acute on chronic respiratory failure with hypoxia (HCC) Active Problems:   Acute on chronic diastolic CHF (congestive heart failure) (HCC)   HTN (hypertension)   Morbid obesity with BMI of 60.0-69.9, adult (HCC)   History of pulmonary embolism (on Coumadin)   Atrial fibrillation with RVR (HCC)   CAD (coronary artery disease)   Long term (current) use of anticoagulants   Alcohol withdrawal syndrome without complication (HCC)   CHF (congestive heart failure) (HCC)  # Acute on chronic respiratory failure with hypoxia (HCC) Patient presenting with acute dyspnea, increased work of breathing, speaking in short sentences with increased O2 requirement up to 6 L to maintain sats in the low to mid 90s, now down to 4 L (home is 3). Likely driven by a fib w/ rvr and resulting diastolic chf exacerbation. Covid and flu negative and no infiltrate on chest x-ray. Does not appear to be  a COPD exacerbation. PE is in the ddx as INR is sub-therapeutic.  - Treat potential etiologies below - will evaluate for possible PE w/ CTA  # Atrial fibrillation w/ RVR # Acute on chronic diastolic CHF (congestive heart failure) (HCC) Here in a fib with hr 110-120s this morning. BNP elevated with chest x-ray showing pulmonary vascular congestion. Note most recent echo showed normal systolic function and did not show diastolic dysfunction.  - continue lasix 40 IV bid for home 40 po bid - continue home metoprolol and amiodarone for the time being, hr is borderline adequately controlled, cardiology consult is pending - warfarin per pharmacy, admitting provider started lovenox which I will continue at 1 mg/kg bid  # Alcohol withdrawal syndrome without complication [facility]) Admitting provider notes patient drinks vodka daily, pt says twice a month, so unclear what actual frequency is. Says drinks up to a liter when does drink, last drink 12/10. Is tremulous on exam, says has not suffered significant withdrawal symptoms or complicated withdrawal in the past. etoh wnl. - cont CIWA - cont thiamine  # HTN (hypertension) Here bp elevated - cont home metoprolol - continue lasix - oral hydral prn  # Morbid obesity with BMI of 60.0-69.9, adult (HCC) - Complicating factor to overall prognosis and care  # History of pulmonary embolism (on Coumadin) INR subtherapeutic. Binge drinking contributing - Pharmacy consult for Coumadin - Full dose Lovenox for now  # Chest pain Suspect 2/2 rvr. Mild trop elevation stable, likely demand, ekg w/o obvious ischemic changes - will repeat 3rd troponin -  tele - monitor - cont aspirin (81 for home 325), atorva  # Chronic pain - cont home cymbalta, hydromorphone prn  # Chronic hypokalemia - cont home kcl 40 qd  # OSA on CPAP - CPAP nightly  # Hypothyroid - cont synthroid - f/u TSH  # T2DM a1c 6.2 10/2019. Here glucose mildly elevated - SSI  moderate  # Insomnia - cont home trazodone  # Bilateral lower extremity venous stasis dermatitis - Keep legs elevated   DVT prophylaxis: lovenox therapeutic Code Status: full Family Communication: none @ bedside  Status is: Inpatient  Remains inpatient appropriate because:Inpatient level of care appropriate due to severity of illness   Dispo: The patient is from: Home              Anticipated d/c is to: Home              Anticipated d/c date is: 1-2 days              Patient currently is not medically stable to d/c.     Consultants:  cardiology  Procedures: none  Antimicrobials:  none    Subjective: This morning breathing and chest pain somewhat improved, still with chest pressure and palpitation sensation, still withdyspnea. Denies productive cough or significant cough or fever, no n/v/d.   Objective: Vitals:   01/27/20 0409 01/27/20 0452 01/27/20 0455 01/27/20 0556  BP: (!) 157/88 (!) 168/137 (!) 168/137   Pulse: (!) 135 (!) 136 (!) 104 (!) 118  Resp: (!) 26  (!) 28   Temp:      TempSrc:      SpO2: 96%  97%   Weight:      Height:       No intake or output data in the 24 hours ending 01/27/20 0808 Filed Weights   01/27/20 0315  Weight: (!) 186 kg    Examination:  General exam: obese, chronically ill appearing Respiratory system: Clear to auscultation. Rales at bases Cardiovascular system: distant heart sounds, faint s1/s2 heard Gastrointestinal system: Abdomen is obese, soft and nontender. No organomegaly or masses felt. Normal bowel sounds heard. Central nervous system: Alert and oriented. No focal neurological deficits. Extremities: Symmetric 5 x 5 power. Skin: LE edema and erythema of distal extremities Psychiatry: Judgement and insight appear normal. Mood & affect appropriate.     Data Reviewed: I have personally reviewed following labs and imaging studies  CBC: Recent Labs  Lab 01/27/20 0319  WBC 8.4  NEUTROABS 5.9  HGB 12.0*  HCT  37.8*  MCV 92.4  PLT 296   Basic Metabolic Panel: Recent Labs  Lab 01/27/20 0319 01/27/20 0454  NA 136  --   K 3.7  --   CL 94*  --   CO2 25  --   GLUCOSE 172*  --   BUN 6  --   CREATININE 0.94  --   CALCIUM 7.8*  --   MG  --  1.5*  PHOS  --  2.3*   GFR: Estimated Creatinine Clearance: 138.1 mL/min (by C-G formula based on SCr of 0.94 mg/dL). Liver Function Tests: Recent Labs  Lab 01/27/20 0319  AST 57*  ALT 31  ALKPHOS 107  BILITOT 1.0  PROT 7.2  ALBUMIN 3.3*   No results for input(s): LIPASE, AMYLASE in the last 168 hours. No results for input(s): AMMONIA in the last 168 hours. Coagulation Profile: Recent Labs  Lab 01/27/20 0319  INR 1.2   Cardiac Enzymes: No results for input(s): CKTOTAL,  CKMB, CKMBINDEX, TROPONINI in the last 168 hours. BNP (last 3 results) No results for input(s): PROBNP in the last 8760 hours. HbA1C: No results for input(s): HGBA1C in the last 72 hours. CBG: No results for input(s): GLUCAP in the last 168 hours. Lipid Profile: No results for input(s): CHOL, HDL, LDLCALC, TRIG, CHOLHDL, LDLDIRECT in the last 72 hours. Thyroid Function Tests: No results for input(s): TSH, T4TOTAL, FREET4, T3FREE, THYROIDAB in the last 72 hours. Anemia Panel: No results for input(s): VITAMINB12, FOLATE, FERRITIN, TIBC, IRON, RETICCTPCT in the last 72 hours. Urine analysis:    Component Value Date/Time   COLORURINE YELLOW (A) 01/07/2020 1535   APPEARANCEUR CLOUDY (A) 01/07/2020 1535   APPEARANCEUR Hazy (A) 12/25/2018 1442   LABSPEC 1.009 01/07/2020 1535   PHURINE 7.0 01/07/2020 1535   GLUCOSEU NEGATIVE 01/07/2020 1535   HGBUR MODERATE (A) 01/07/2020 1535   BILIRUBINUR NEGATIVE 01/07/2020 1535   BILIRUBINUR Negative 12/25/2018 1442   KETONESUR NEGATIVE 01/07/2020 1535   PROTEINUR NEGATIVE 01/07/2020 1535   NITRITE NEGATIVE 01/07/2020 1535   LEUKOCYTESUR TRACE (A) 01/07/2020 1535   Sepsis  Labs: @LABRCNTIP (procalcitonin:4,lacticidven:4)  ) Recent Results (from the past 240 hour(s))  Resp Panel by RT-PCR (Flu A&B, Covid) Nasopharyngeal Swab     Status: None   Collection Time: 01/27/20  3:19 AM   Specimen: Nasopharyngeal Swab; Nasopharyngeal(NP) swabs in vial transport medium  Result Value Ref Range Status   SARS Coronavirus 2 by RT PCR NEGATIVE NEGATIVE Final    Comment: (NOTE) SARS-CoV-2 target nucleic acids are NOT DETECTED.  The SARS-CoV-2 RNA is generally detectable in upper respiratory specimens during the acute phase of infection. The lowest concentration of SARS-CoV-2 viral copies this assay can detect is 138 copies/mL. A negative result does not preclude SARS-Cov-2 infection and should not be used as the sole basis for treatment or other patient management decisions. A negative result may occur with  improper specimen collection/handling, submission of specimen other than nasopharyngeal swab, presence of viral mutation(s) within the areas targeted by this assay, and inadequate number of viral copies(<138 copies/mL). A negative result must be combined with clinical observations, patient history, and epidemiological information. The expected result is Negative.  Fact Sheet for Patients:  14/12/21  Fact Sheet for Healthcare Providers:  BloggerCourse.com  This test is no t yet approved or cleared by the [employer]ious[employer].[place] FDA and  has been authorized for detection and/or diagnosis of SARS-CoV-2 by FDA under an Emergency Use Authorization (EUA). This EUA will remain  in effect (meaning this test can be used) for the duration of the COVID-19 declaration under Section 564(b)(1) of the Act, 21 U.S.C.section 360bbb-3(b)(1), unless the authorization is terminated  or revoked sooner.       Influenza A by PCR NEGATIVE NEGATIVE Final   Influenza B by PCR NEGATIVE NEGATIVE Final    Comment: (NOTE) The Xpert  Xpress SARS-CoV-2/FLU/RSV plus assay is intended as an aid in the diagnosis of influenza from Nasopharyngeal swab specimens and should not be used as a sole basis for treatment. Nasal washings and aspirates are unacceptable for Xpert Xpress SARS-CoV-2/FLU/RSV testing.  Fact Sheet for Patients: [place]  Fact Sheet for Healthcare Providers: BloggerCourse.com  This test is not yet approved or cleared by the [employer]ious[employer]ker.[employer] FDA and has been authorized for detection and/or diagnosis of SARS-CoV-2 by FDA under an Emergency Use Authorization (EUA). This EUA will remain in effect (meaning this test can be used) for the duration of the COVID-19 declaration under Section 564(b)(1) of the  Act, 21 U.S.C. section 360bbb-3(b)(1), unless the authorization is terminated or revoked.  Performed at [facility], [address], [place], [place] [address]          Radiology Studies: [facility] Chest [place] 1 View  Result Date: 01/27/2020 CLINICAL DATA:  Initial evaluation for acute shortness of breath. EXAM: PORTABLE CHEST 1 VIEW COMPARISON:  Prior radiograph from 01/07/2020. FINDINGS: Cardiomegaly, stable.  Mediastinal silhouette within normal limits. Lungs mildly hypoinflated. Diffuse pulmonary vascular and interstitial congestion, suggesting mild pulmonary interstitial congestion/edema. No visible pleural effusion. No focal infiltrates. No pneumothorax. No acute osseous finding. IMPRESSION: Cardiomegaly with mild diffuse pulmonary interstitial congestion/edema. Electronically Signed   By: Rise Mu M.D.   On: 01/27/2020 04:03        Scheduled Meds: . enoxaparin (LOVENOX) injection  1 mg/kg Subcutaneous Q12H  . folic acid  1 mg Oral Daily  . furosemide  40 mg Intravenous Q12H  . insulin aspart  0-20 Units Subcutaneous TID WC  . insulin aspart  0-5 Units Subcutaneous QHS  . LORazepam  0-4 mg Intravenous Q6H  . LORazepam   0-4 mg Intravenous Q6H   Followed by  . [START ON 01/29/2020] LORazepam  0-4 mg Intravenous Q12H  . LORazepam  1 mg Intravenous Once  . metoprolol succinate  100 mg Oral Daily  . multivitamin with minerals  1 tablet Oral Daily  . sodium chloride flush  3 mL Intravenous Q12H  . thiamine  100 mg Oral Daily   Or  . thiamine  100 mg Intravenous Daily  . thiamine  100 mg Oral Daily  . warfarin  7.5 mg Oral ONCE-1600  . Warfarin - Pharmacist Dosing Inpatient   Does not apply q1600   Continuous Infusions: . sodium chloride       LOS: 0 days    Time spent: 50 min    Silvano Bilis, MD [facility]   If 7PM-7AM, please contact night-coverage [email or web address] Password [email or web address] 01/27/2020, 8:08 AM

## 2020-01-27 NOTE — ED Notes (Signed)
Pt took all equipment off. Sitting on toilet. Explained importance of timely vitals.

## 2020-01-27 NOTE — Consult Note (Signed)
[facility] Cardiology  CARDIOLOGY CONSULT NOTE  Patient ID: [name] [name] MRN: [id number] DOB/AGE: [birth year] 59 y.o.  Admit date: 01/27/2020 Referring Physician [facility] Primary Physician [facility] Primary Cardiologist [facility] Reason for Consultation congestive heart failure and atrial fibrillation  HPI: 59 year old gentleman referred for evaluation of congestive heart failure and atrial fibrillation.  Patient has a history of chronic diastolic congestive heart failure, COPD on home O2, elective sleep apnea, morbid obesity, severe alcohol disorder, and chronic atrial fibrillation with history of pulmonary embolus on warfarin.  He presents with acute onset of dyspnea, and atrial fibrillation with rapid ventricular rate.  ECG revealed atrial fibrillation at a rate of 127 bpm.  Chest x-ray revealed mild diffuse edema.  Labs notable for high sensitive troponin of 62 and 68.  BNP was 279.8.  INR is 1.2.  Patient is Covid and flu negative.  Of note, patient drinks vodka daily, very tremulous on examination.  The patient is followed by Dr. Juliann Pares.  Patient is typically wheelchair-bound, with chronic lymphedema.  2D echocardiogram 06/23/2019 revealed LVEF of 55 to 60%.  Patient has intermittent episodes of chest discomfort, nuclear stress test recently ordered but canceled due to resolution of chest pain.  Review of systems complete and found to be negative unless listed above     Past Medical History:  Diagnosis Date  . Allergy   . Anxiety   . Arthritis   . Asthma   . Brain damage   . Chronic pain of both knees 07/13/2018  . Clotting disorder (HCC)   . COPD (chronic obstructive pulmonary disease) (HCC)   . Depression   . GERD (gastroesophageal reflux disease)   . HFrEF (heart failure with reduced ejection fraction) (HCC)    a. 03/2018 Echo: EF 25-30%, diff HK. Mod LAE.  [name] History of DVT (deep vein thrombosis)   . History of pulmonary embolism    a. Chronic coumadin.  [name] Hypertension   . MI  (myocardial infarction) (HCC)   . Morbid obesity (HCC)   . Neck pain 07/13/2018  . NICM (nonischemic cardiomyopathy) (HCC)    a. s/p Cath x 3 - reportedly nl cors. Last cath 2019 in [place]; b. a. 03/2018 Echo: EF 25-30%, diff HK.  [name] Persistent atrial fibrillation (HCC)    a. 03/2018 s/p DCCV; b. CHA2DS2VASc = 1-->Xarelto (later changed to warfarin); c. 05/2018 recurrent afib-->Amio initiated.  . Sleep apnea   . Sleep apnea     Past Surgical History:  Procedure Laterality Date  . CARDIOVERSION N/A 03/24/2018   Procedure: CARDIOVERSION;  Surgeon: Antonieta Iba, MD;  Location: [facility] ORS;  Service: Cardiovascular;  Laterality: N/A;  . CARDIOVERSION N/A 08/08/2018   Procedure: CARDIOVERSION;  Surgeon: [name], MD;  Location: [facility] ORS;  Service: Cardiovascular;  Laterality: N/A;  . hearnia repair     X 3- total of two surgeries  . HERNIA REPAIR    . LEG SURGERY      (Not in a hospital admission)  Social History   Socioeconomic History  . Marital status: Single    Spouse name: Not on file  . Number of children: Not on file  . Years of education: Not on file  . Highest education level: Not on file  Occupational History  . Not on file  Tobacco Use  . Smoking status: Never Smoker  . Smokeless tobacco: Never Used  Vaping Use  . Vaping Use: Never used  Substance and Sexual Activity  . Alcohol use: Yes  . Drug use: Never  .  Sexual activity: Not Currently  Other Topics Concern  . Not on file  Social History Narrative  . Not on file   Social Determinants of Health   Financial Resource Strain: Medium Risk  . Difficulty of Paying Living Expenses: Somewhat hard  Food Insecurity: No Food Insecurity  . Worried About Programme researcher, broadcasting/film/video in the Last Year: Never true  . Ran Out of Food in the Last Year: Never true  Transportation Needs: Unmet Transportation Needs  . Lack of Transportation (Medical): Yes  . Lack of Transportation (Non-Medical): Yes  Physical Activity: Inactive  .  Days of Exercise per Week: 0 days  . Minutes of Exercise per Session: 0 min  Stress: Stress Concern Present  . Feeling of Stress : Rather much  Social Connections: Socially Isolated  . Frequency of Communication with Friends and Family: Once a week  . Frequency of Social Gatherings with Friends and Family: Once a week  . Attends Religious Services: Never  . Active Member of Clubs or Organizations: No  . Attends Banker Meetings: Never  . Marital Status: Widowed  Intimate Partner Violence: Not on file    Family History  Problem Relation Age of Onset  . Heart failure Mother   . Lung cancer Mother   . Lung cancer Father   . Heart attack Maternal Grandmother   . Heart attack Maternal Grandfather       Review of systems complete and found to be negative unless listed above      PHYSICAL EXAM  General: Well developed, well nourished, in no acute distress HEENT:  Normocephalic and atramatic Neck:  No JVD.  Lungs: Clear bilaterally to auscultation and percussion. Heart: HRRR . Normal S1 and S2 without gallops or murmurs.  Abdomen: Bowel sounds are positive, abdomen soft and non-tender  Msk:  Back normal, normal gait. Normal strength and tone for age. Extremities: No clubbing, cyanosis or edema.   Neuro: Alert and oriented X 3. Psych:  Good affect, responds appropriately  Labs:   Lab Results  Component Value Date   WBC 8.4 01/27/2020   HGB 12.0 (L) 01/27/2020   HCT 37.8 (L) 01/27/2020   MCV 92.4 01/27/2020   PLT 296 01/27/2020    Recent Labs  Lab 01/27/20 0319  NA 136  K 3.7  CL 94*  CO2 25  BUN 6  CREATININE 0.94  CALCIUM 7.8*  PROT 7.2  BILITOT 1.0  ALKPHOS 107  ALT 31  AST 57*  GLUCOSE 172*   Lab Results  Component Value Date   CKTOTAL 349 08/09/2019   TROPONINI 0.04 (HH) 08/07/2018    Lab Results  Component Value Date   CHOL 175 11/08/2019   CHOL 163 06/23/2019   CHOL 166 05/02/2019   Lab Results  Component Value Date   HDL 75  11/08/2019   HDL 79 06/23/2019   HDL 70 05/02/2019   Lab Results  Component Value Date   LDLCALC 87 11/08/2019   LDLCALC 65 06/23/2019   LDLCALC 75 05/02/2019   Lab Results  Component Value Date   TRIG 63 11/08/2019   TRIG 95 06/23/2019   TRIG 122 05/02/2019   Lab Results  Component Value Date   CHOLHDL 2.3 11/08/2019   CHOLHDL 2.1 06/23/2019   CHOLHDL 3.2 01/22/2019   No results found for: LDLDIRECT    Radiology: US Venous Img Lower Bilateral (DVT)  Result Date: 01/08/2020 CLINICAL DATA:  Bilateral lower extremity pain and edema. Evaluate for DVT. EXAM:  BILATERAL LOWER EXTREMITY VENOUS DOPPLER ULTRASOUND TECHNIQUE: Gray-scale sonography with graded compression, as well as color Doppler and duplex ultrasound were performed to evaluate the lower extremity deep venous systems from the level of the common femoral vein and including the common femoral, femoral, profunda femoral, popliteal and calf veins including the posterior tibial, peroneal and gastrocnemius veins when visible. The superficial great saphenous vein was also interrogated. Spectral Doppler was utilized to evaluate flow at rest and with distal augmentation maneuvers in the common femoral, femoral and popliteal veins. COMPARISON:  Bilateral lower extremity venous Doppler ultrasound-12/22/2019; 01/21/2019; 10/24/2018 (all negative); left lower extremity venous Doppler ultrasound-11/07/2019 (negative FINDINGS: Examination is degraded due to patient body habitus and poor sonographic window RIGHT LOWER EXTREMITY Common Femoral Vein: No evidence of thrombus. Normal compressibility, respiratory phasicity and response to augmentation. Saphenofemoral Junction: No evidence of thrombus. Normal compressibility and flow on color Doppler imaging. Profunda Femoral Vein: No evidence of thrombus. Normal compressibility and flow on color Doppler imaging. Femoral Vein: No evidence of thrombus. Normal compressibility, respiratory phasicity and  response to augmentation. Popliteal Vein: No evidence of thrombus. Normal compressibility, respiratory phasicity and response to augmentation. Calf Veins: Appear patent where visualized. Superficial Great Saphenous Vein: No evidence of thrombus. Normal compressibility. Venous Reflux:  None. Other Findings:  None. LEFT LOWER EXTREMITY Common Femoral Vein: No evidence of thrombus. Normal compressibility, respiratory phasicity and response to augmentation. Saphenofemoral Junction: No evidence of thrombus. Normal compressibility and flow on color Doppler imaging. Profunda Femoral Vein: No evidence of thrombus. Normal compressibility and flow on color Doppler imaging. Femoral Vein: No evidence of thrombus. Normal compressibility, respiratory phasicity and response to augmentation. Popliteal Vein: No evidence of thrombus. Normal compressibility, respiratory phasicity and response to augmentation. Calf Veins: Appear patent where visualized. Superficial Great Saphenous Vein: No evidence of thrombus. Normal compressibility. Venous Reflux:  None. Other Findings:  None. IMPRESSION: No evidence of DVT within either lower extremity. Note, this is the 5th negative lower extremity venous Doppler ultrasound since 10/24/2018. Electronically Signed   By: Simonne Come M.D.   On: 01/08/2020 07:49   DG Chest Port 1 View  Result Date: 01/27/2020 CLINICAL DATA:  Initial evaluation for acute shortness of breath. EXAM: PORTABLE CHEST 1 VIEW COMPARISON:  Prior radiograph from 01/07/2020. FINDINGS: Cardiomegaly, stable.  Mediastinal silhouette within normal limits. Lungs mildly hypoinflated. Diffuse pulmonary vascular and interstitial congestion, suggesting mild pulmonary interstitial congestion/edema. No visible pleural effusion. No focal infiltrates. No pneumothorax. No acute osseous finding. IMPRESSION: Cardiomegaly with mild diffuse pulmonary interstitial congestion/edema. Electronically Signed   By: Rise Mu M.D.   On:  01/27/2020 04:03   DG Chest Portable 1 View  Result Date: 01/07/2020 CLINICAL DATA:  Respiratory distress, COPD exacerbation, asthma, hypertension EXAM: PORTABLE CHEST 1 VIEW COMPARISON:  Portable exam 1545 hours compared to 12/22/2019 FINDINGS: Enlargement of cardiac silhouette. Mediastinal contours and pulmonary vascularity normal. Lungs grossly clear. No definite infiltrate, pleural effusion, or pneumothorax. Osseous structures unremarkable. IMPRESSION: Enlargement of cardiac silhouette. No acute abnormalities. Electronically Signed   By: Ulyses Southward M.D.   On: 01/07/2020 16:00   US ABDOMEN LIMITED RUQ (LIVER/GB)  Result Date: 01/08/2020 CLINICAL DATA:  Right upper quadrant pain EXAM: ULTRASOUND ABDOMEN LIMITED RIGHT UPPER QUADRANT COMPARISON:  None. FINDINGS: Gallbladder: No gallstones or wall thickening visualized. No sonographic Murphy sign noted by sonographer. Common bile duct: Diameter: Normal caliber, 5 mm Liver: Increased echotexture compatible with fatty infiltration. 3.5 cm simple appearing cyst in the right hepatic lobe. Portal vein is patent  on color Doppler imaging with normal direction of blood flow towards the liver. Other: None. IMPRESSION: Hepatic steatosis. Electronically Signed   By: Charlett Nose M.D.   On: 01/08/2020 10:44    EKG: Atrial fibrillation with nonspecific intraventricular conduction delay at a rate of 127 bpm  ASSESSMENT AND PLAN:   1.  Acute on chronic respiratory failure with hypoxia, multifactorial, secondary to COPD, obstructive sleep apnea, acute on chronic diastolic congestive heart failure, exacerbated by medical and dietary noncompliance, obesity, and daily alcohol abuse.  Patient has a history of prior pulmonary embolus, subtherapeutic on warfarin.   Chest CT pending. 2.  Acute on chronic diastolic congestive heart failure, likely noncompliant with diet 3.  Atrial fibrillation with rapid ventricular rate, subtherapeutic on warfarin for stroke  prevention 4.  Chest pain, atypical features, borderline elevated high-sensitivity troponin (62, 68) without delta, with nondiagnostic ECG 5.  Alcohol withdrawal syndrome, with tremulousness and tachycardia, in patient with daily alcohol use 6.  History of pulmonary embolus, subtherapeutic on warfarin, chest CT pending  Recommendations  1.  Agree with current plan 2.  Defer full dose anticoagulation at this time 3.  Continue warfarin, target INR 2.0-3.0 4.  Agree with furosemide 40 mg IV twice daily 5.  Carefully monitor renal status 6.  Continue amiodarone 200 mg twice daily 7.  Continue metoprolol succinate 100 mg daily 8.  Defer cardiac catheterization or nuclear stress test at this time 9.  Further recommendations pending patient's initial clinical course    Signed: Marcina Millard MD,PhD, [facility] 01/27/2020, 9:41 AM

## 2020-01-27 NOTE — ED Notes (Signed)
Inhaler and warfarin received

## 2020-01-27 NOTE — ED Notes (Signed)
Pt returned from CT via wheelchair.

## 2020-01-27 NOTE — ED Notes (Signed)
Helped p[t back to bed. Had BM and 1 void on toilet. Obtaining VS.

## 2020-01-27 NOTE — ED Notes (Signed)
Admitting NP messaged per pt request regarding desired duoneb. Pt does report some improvement to his breathing following inhaler treatment, but still feels like he could benefit from the duoneb. NP messaged, who is reviewing his chart to update his pain medication as well.  Pt states at home he takes 5mg  oxycodone BID, and gabapentin though he says "that doesn't do anything for me."  Pt reports worsening headache, as well as pain in neck, lower back, and hip

## 2020-01-27 NOTE — ED Triage Notes (Signed)
Pt from home via [facility]. Pt woke up out of sleep with SOB and chest pain. Pt hx of CHF and wears 3L O2 chronically.

## 2020-01-27 NOTE — ED Provider Notes (Signed)
[facility] Emergency Department Provider Note   ____________________________________________   Event Date/Time   First MD Initiated Contact with Patient 01/27/20 [id number]     (approximate)  I have reviewed the triage vital signs and the nursing notes.   HISTORY  Chief Complaint Shortness of Breath    HPI [name] [name] is a 59 y.o. male brought to the ED via EMS from home with a chief complaint of respiratory distress.  Patient with a history of COPD, CHF on 3 L continuous oxygenation, on warfarin for history of PE as well as atrial fibrillation who awoke from sleep approximately 2 hours prior to arrival with shortness of breath.  Symptoms associated with chest tightness and nausea.  Denies fever, cough, abdominal pain, vomiting or diarrhea.     Past Medical History:  Diagnosis Date   Allergy    Anxiety    Arthritis    Asthma    Brain damage    Chronic pain of both knees 07/13/2018   Clotting disorder (HCC)    COPD (chronic obstructive pulmonary disease) (HCC)    Depression    GERD (gastroesophageal reflux disease)    HFrEF (heart failure with reduced ejection fraction) (HCC)    a. 03/2018 Echo: EF 25-30%, diff HK. Mod LAE.   History of DVT (deep vein thrombosis)    History of pulmonary embolism    a. Chronic coumadin.   Hypertension    MI (myocardial infarction) (HCC)    Morbid obesity (HCC)    Neck pain 07/13/2018   NICM (nonischemic cardiomyopathy) (HCC)    a. s/p Cath x 3 - reportedly nl cors. Last cath 2019 in [place]; b. a. 03/2018 Echo: EF 25-30%, diff HK.   Persistent atrial fibrillation (HCC)    a. 03/2018 s/p DCCV; b. CHA2DS2VASc = 1-->Xarelto (later changed to warfarin); c. 05/2018 recurrent afib-->Amio initiated.   Sleep apnea    Sleep apnea     Patient Active Problem List   Diagnosis Date Noted   CHF (congestive heart failure) (HCC) 01/27/2020   Community acquired pneumonia 12/25/2019   Shortness of breath  12/22/2019   Atrial fibrillation, chronic (HCC) 12/22/2019   Class 3 obesity with alveolar hypoventilation, serious comorbidity, and body mass index (BMI) of 60.0 to 69.9 in adult (HCC) 12/22/2019   GAD (generalized anxiety disorder) 10/12/2019   Alcohol withdrawal syndrome without complication (HCC)    Lower extremity ulceration, unspecified laterality, with fat layer exposed (HCC) 08/09/2019   Alcohol abuse    Pressure injury of skin 06/26/2019   Iron deficiency anemia 06/22/2019   Depression 06/22/2019   Elevated troponin 06/22/2019   Left-sided Bell's palsy 05/08/2019   Chronic intractable headache 05/03/2019   Head injuries, initial encounter 05/03/2019   Noncompliance by refusing service 05/03/2019   Facial droop 05/02/2019   Pharmacologic therapy 04/11/2019   Disorder of skeletal system 04/11/2019   Problems influencing health status 04/11/2019   Chronic anticoagulation (warfarin   COUMADIN) 04/11/2019   Elevated sed rate 04/11/2019   Elevated C-reactive protein (CRP) 04/11/2019   Elevated hemoglobin A1c 04/11/2019   Hypoalbuminemia 04/11/2019   Edema due to hypoalbuminemia 04/11/2019   Elevated brain natriuretic peptide (BNP) level 04/11/2019   Chronic hip pain (Right) 04/11/2019   Osteoarthritis of hip (Right) 04/11/2019   Atrial fibrillation with RVR (HCC) 03/08/2019   Degenerative joint disease of right hip 03/05/2019   Hypothyroidism 03/04/2019   Chronic venous stasis dermatitis of both lower extremities 03/04/2019   Long term (current) use of  anticoagulants 02/23/2019   PE (pulmonary thromboembolism) (HCC) 01/21/2019   HLD (hyperlipidemia) 01/21/2019   CAD (coronary artery disease) 01/21/2019   Cellulitis of right lower extremity 01/21/2019   Noncompliance 01/09/2019   Acquired thrombophilia (HCC) 11/28/2018   Major depressive disorder, recurrent episode, in partial remission (HCC) 09/17/2018   Chest pain 08/06/2018    Personal history of DVT (deep vein thrombosis) 07/13/2018   History of pulmonary embolism (on Coumadin) 07/13/2018   Neck pain 07/13/2018   Chronic low back pain (Bilateral)  w/ sciatica (Bilateral) 07/13/2018   TBI (traumatic brain injury) (HCC) 07/04/2018   Abnormal thyroid blood test 06/27/2018   Type 2 diabetes mellitus with other specified complication (HCC) 06/06/2018   HTN (hypertension) 06/06/2018   Chronic gout involving toe, unspecified cause, unspecified laterality 06/06/2018   Morbid obesity with BMI of 60.0-69.9, adult (HCC) 06/06/2018   Chronic pain syndrome 06/06/2018   Ulcers of both lower extremities, limited to breakdown of skin (HCC) 06/06/2018   Gout 06/06/2018   Persistent atrial fibrillation (HCC)    Severe sepsis with septic shock (HCC) 03/17/2018   Acute on chronic diastolic CHF (congestive heart failure) (HCC) 02/02/2018   Centrilobular emphysema (HCC) 02/02/2018   OSA (obstructive sleep apnea) 02/02/2018   Lymphedema 02/02/2018   Acute on chronic respiratory failure with hypoxia (HCC) 01/27/2018    Past Surgical History:  Procedure Laterality Date   CARDIOVERSION N/A 03/24/2018   Procedure: CARDIOVERSION;  Surgeon: Antonieta Iba, MD;  Location: [facility] ORS;  Service: Cardiovascular;  Laterality: N/A;   CARDIOVERSION N/A 08/08/2018   Procedure: CARDIOVERSION;  Surgeon: [name], MD;  Location: [facility] ORS;  Service: Cardiovascular;  Laterality: N/A;   hearnia repair     X 3- total of two surgeries   HERNIA REPAIR     LEG SURGERY      Prior to Admission medications   Medication Sig Start Date End Date Taking? Authorizing Provider  albuterol (VENTOLIN HFA) 108 (90 Base) MCG/ACT inhaler Inhale 2 puffs into the lungs every 4 (four) hours as needed for wheezing or shortness of breath. 10/25/19  Yes Karamalegos, Netta Neat, DO  amiodarone (PACERONE) 200 MG tablet Take 1 tablet (200 mg total) by mouth 2 (two) times daily. 01/12/20  Yes  Enedina Finner, MD  aspirin 325 MG tablet Take 325 mg by mouth daily.   Yes [provider]  atorvastatin (LIPITOR) 40 MG tablet TAKE 1 TABLET BY MOUTH ONCE DAILY Patient taking differently: Take 40 mg by mouth daily. 08/15/19  Yes Flinchum, [name], FNP  DULoxetine (CYMBALTA) 20 MG capsule Take 1 capsule (20 mg total) by mouth daily. 10/25/19  Yes Karamalegos, Netta Neat, DO  ferrous sulfate 325 (65 FE) MG tablet Take 325 mg by mouth 2 (two) times daily.   Yes [provider]  fluticasone (FLONASE) 50 MCG/ACT nasal spray Place 2 sprays into both nostrils 2 (two) times daily.    Yes [provider]  furosemide (LASIX) 40 MG tablet Take 1 tablet (40 mg total) by mouth 2 (two) times daily. 01/12/20 01/11/21 Yes Enedina Finner, MD  HYDROcodone-acetaminophen (NORCO) 7.5-325 MG tablet Take 1 tablet by mouth every 8 (eight) hours as needed for moderate pain or severe pain. 12/28/19  Yes Lurene Shadow, MD  levothyroxine (SYNTHROID) 75 MCG tablet Take 1 tablet (75 mcg total) by mouth daily before breakfast. 01/13/20  Yes Enedina Finner, MD  loperamide (IMODIUM) 2 MG capsule Take 1 capsule (2 mg total) by mouth as needed for diarrhea or loose  stools. 06/26/19  Yes Arnetha Courser, MD  magnesium oxide (MAG-OX) 400 (241.3 Mg) MG tablet Take 1 tablet (400 mg total) by mouth 2 (two) times daily. 01/12/20  Yes Enedina Finner, MD  metoprolol succinate (TOPROL-XL) 50 MG 24 hr tablet Take 2 tablets (100 mg total) by mouth daily. Take with or immediately following a meal. 01/12/20  Yes Enedina Finner, MD  montelukast (SINGULAIR) 10 MG tablet Take 1 tablet (10 mg total) by mouth at bedtime. 10/12/19  Yes Karamalegos, Netta Neat, DO  Multiple Vitamin (MULTIVITAMIN WITH MINERALS) TABS tablet Take 1 tablet by mouth daily.   Yes [provider]  naphazoline-glycerin (CLEAR EYES REDNESS) 0.012-0.2 % SOLN Place 1-2 drops into both eyes 4 (four) times daily as needed for eye irritation. 08/14/19  Yes  Wieting, Richard, MD  nitroGLYCERIN (NITROSTAT) 0.4 MG SL tablet Place 1 tablet under tongue every 5 minutes as needed for chest pain. (No more than 3 doses within 15 minutes) 03/08/19  Yes Flinchum, [name], FNP  potassium chloride (KLOR-CON) 20 MEQ packet Take 40 mEq by mouth daily. 01/12/20  Yes Enedina Finner, MD  SUMAtriptan (IMITREX) 50 MG tablet Take 1 tablet (50 mg total) by mouth every 2 (two) hours as needed for migraine or headache. May repeat in 2 hours if headache persists or recurs. No more than 2 doses in a day 12/28/19  Yes Lurene Shadow, MD  thiamine 100 MG tablet Take 1 tablet (100 mg total) by mouth daily. 08/14/19  Yes Wieting, Richard, MD  traZODone (DESYREL) 150 MG tablet Take 1 tablet (150 mg total) by mouth at bedtime. 10/25/19  Yes Karamalegos, Netta Neat, DO  warfarin (COUMADIN) 5 MG tablet Take 5 mg by mouth daily.  09/20/19  Yes [provider]    Allergies Patient has no known allergies.  Family History  Problem Relation Age of Onset   Heart failure Mother    Lung cancer Mother    Lung cancer Father    Heart attack Maternal Grandmother    Heart attack Maternal Grandfather     Social History Social History   Tobacco Use   Smoking status: Never Smoker   Smokeless tobacco: Never Used  Building services engineer Use: Never used  Substance Use Topics   Alcohol use: Yes   Drug use: Never    Review of Systems  Constitutional: No fever/chills Eyes: No visual changes. ENT: No sore throat. Cardiovascular: Positive for chest pain. Respiratory: Positive for shortness of breath. Gastrointestinal: No abdominal pain.  Positive for nausea, no vomiting.  No diarrhea.  No constipation. Genitourinary: Negative for dysuria. Musculoskeletal: Negative for back pain. Skin: Negative for rash. Neurological: Negative for headaches, focal weakness or numbness.   ____________________________________________   PHYSICAL EXAM:  VITAL SIGNS: ED Triage Vitals   Enc Vitals Group     BP --      Pulse Rate 01/27/20 0318 (!) 126     Resp 01/27/20 0318 (!) 26     Temp 01/27/20 0318 98.9 F (37.2 C)     Temp Source 01/27/20 0318 Oral     SpO2 01/27/20 0318 96 %     Weight 01/27/20 0315 (!) 410 lb (186 kg)     Height 01/27/20 0315 5\' 8"  (1.727 m)     Head Circumference --      Peak Flow --      Pain Score 01/27/20 0314 8     Pain Loc --      Pain Edu? --  Excl. in GC? --     Constitutional: Alert and oriented.  Uncomfortable appearing and in moderate acute distress. Eyes: Conjunctivae are normal. PERRL. EOMI. Head: Atraumatic. Nose: No congestion/rhinnorhea. Mouth/Throat: Mucous membranes are moist.   Neck: No stridor.   Cardiovascular: Tachycardic rate, regular rhythm. Grossly normal heart sounds.  Good peripheral circulation. Respiratory: Increased respiratory effort.  No retractions. Lungs with faint bibasilar rales. Gastrointestinal: Obese.  Soft and nontender. No distention. No abdominal bruits. No CVA tenderness. Musculoskeletal: No lower extremity tenderness.  3+ nonpitting BLE edema with wraps on both legs.  No joint effusions. Neurologic:  Normal speech and language. No gross focal neurologic deficits are appreciated.  Skin:  Skin is warm, dry and intact. No rash noted. Psychiatric: Mood and affect are normal. Speech and behavior are normal.  ____________________________________________   LABS (all labs ordered are listed, but only abnormal results are displayed)  Labs Reviewed  CBC WITH DIFFERENTIAL/PLATELET - Abnormal; Notable for the following components:      Result Value   RBC 4.09 (*)    Hemoglobin 12.0 (*)    HCT 37.8 (*)    RDW 16.8 (*)    All other components within normal limits  COMPREHENSIVE METABOLIC PANEL - Abnormal; Notable for the following components:   Chloride 94 (*)    Glucose, Bld 172 (*)    Calcium 7.8 (*)    Albumin 3.3 (*)    AST 57 (*)    Anion gap 17 (*)    All other components within  normal limits  BRAIN NATRIURETIC PEPTIDE - Abnormal; Notable for the following components:   B Natriuretic Peptide 279.8 (*)    All other components within normal limits  MAGNESIUM - Abnormal; Notable for the following components:   Magnesium 1.5 (*)    All other components within normal limits  PHOSPHORUS - Abnormal; Notable for the following components:   Phosphorus 2.3 (*)    All other components within normal limits  TROPONIN I (HIGH SENSITIVITY) - Abnormal; Notable for the following components:   Troponin I (High Sensitivity) 62 (*)    All other components within normal limits  TROPONIN I (HIGH SENSITIVITY) - Abnormal; Notable for the following components:   Troponin I (High Sensitivity) 68 (*)    All other components within normal limits  RESP PANEL BY RT-PCR (FLU A&B, COVID) ARPGX2  PROTIME-INR  ETHANOL  HIV ANTIBODY (ROUTINE TESTING W REFLEX)  HEMOGLOBIN A1C   ____________________________________________  EKG  ED ECG REPORT I, Tiffanni Scarfo J, the attending physician, personally viewed and interpreted this ECG.   Date: 01/27/2020  EKG Time: 0320  Rate: 127  Rhythm: atrial fibrillation, rate 127  Axis: Normal  Intervals:none  ST&T Change: Nonspecific  ____________________________________________  RADIOLOGY I, Alando Colleran J, personally viewed and evaluated these images (plain radiographs) as part of my medical decision making, as well as reviewing the written report by the radiologist.  ED MD interpretation: Cardiomegaly with pulmonary edema  Official radiology report(s): DG Chest Port 1 View  Result Date: 01/27/2020 CLINICAL DATA:  Initial evaluation for acute shortness of breath. EXAM: PORTABLE CHEST 1 VIEW COMPARISON:  Prior radiograph from 01/07/2020. FINDINGS: Cardiomegaly, stable.  Mediastinal silhouette within normal limits. Lungs mildly hypoinflated. Diffuse pulmonary vascular and interstitial congestion, suggesting mild pulmonary interstitial congestion/edema.  No visible pleural effusion. No focal infiltrates. No pneumothorax. No acute osseous finding. IMPRESSION: Cardiomegaly with mild diffuse pulmonary interstitial congestion/edema. Electronically Signed   By: Rise Mu M.D.   On: 01/27/2020 04:03  ____________________________________________   PROCEDURES  Procedure(s) performed (including Critical Care):  .1-3 Lead EKG Interpretation Performed by: Irean Hong, MD Authorized by: Irean Hong, MD     Interpretation: abnormal     ECG rate:  126   ECG rate assessment: tachycardic     Rhythm: sinus tachycardia     Ectopy: none     Conduction: normal   Comments:     Patient placed on cardiac monitor to evaluate for arrhythmias   CRITICAL CARE Performed by: Irean Hong   Total critical care time: 30 minutes  Critical care time was exclusive of separately billable procedures and treating other patients.  Critical care was necessary to treat or prevent imminent or life-threatening deterioration.  Critical care was time spent personally by me on the following activities: development of treatment plan with patient and/or surrogate as well as nursing, discussions with consultants, evaluation of patient's response to treatment, examination of patient, obtaining history from patient or surrogate, ordering and performing treatments and interventions, ordering and review of laboratory studies, ordering and review of radiographic studies, pulse oximetry and re-evaluation of patient's condition.  ____________________________________________   INITIAL IMPRESSION / ASSESSMENT AND PLAN / ED COURSE  As part of my medical decision making, I reviewed the following data within the electronic MEDICAL RECORD NUMBER Nursing notes reviewed and incorporated, Labs reviewed, EKG interpreted, Old chart reviewed (community paramedic visits, COPD office visits), Radiograph reviewed, Discussed with admitting physician and Notes from prior ED visits  (01/07/2020)     59 year old male with COPD, CHF on continuous oxygenation presenting with respiratory distress. Differential includes, but is not limited to, viral syndrome, bronchitis including COPD exacerbation, pneumonia, reactive airway disease including asthma, CHF including exacerbation with or without pulmonary/interstitial edema, pneumothorax, ACS, thoracic trauma, and pulmonary embolism.  We will obtain cardiac work-up, chest x-ray, EKG.  Consider diuresis pending chest x-ray.  Anticipate hospitalization.  Clinical Course as of 01/27/20 4098  Wynelle Link Jan 27, 2020  1191 Patient has an extensive history of alcohol abuse. States his last EtOH drink was last night around 6 PM. He is tremulous, tachycardic and hypertensive. Will place on CIWA. [JS]  0402 Oxygen increased to 6 L nasal cannula with saturations 97%.  After IV Ativan administration, patient's pulse rate and respiratory rate have improved.  Remains hypertensive.  Will add IV Lasix for diuresis.  Will discuss with hospitalist services for admission. [JS]    Clinical Course User Index [JS] Irean Hong, MD     ____________________________________________   FINAL CLINICAL IMPRESSION(S) / ED DIAGNOSES  Final diagnoses:  Shortness of breath  Alcohol withdrawal syndrome without complication (HCC)  Acute on chronic congestive heart failure, unspecified heart failure type [facility])  Subtherapeutic anticoagulation     ED Discharge Orders         Ordered    Amb referral to [facility]        01/27/20 0439          *Please note:  [name] [name] was evaluated in Emergency Department on 01/27/2020 for the symptoms described in the history of present illness. He was evaluated in the context of the global COVID-19 pandemic, which necessitated consideration that the patient might be at risk for infection with the SARS-CoV-2 virus that causes COVID-19. Institutional protocols and algorithms that pertain to the evaluation of patients  at risk for COVID-19 are in a state of rapid change based on information released by regulatory bodies including the CDC and federal and state organizations. These  policies and algorithms were followed during the patient's care in the ED.  Some ED evaluations and interventions may be delayed as a result of limited staffing during and the pandemic.*   Note:  This document was prepared using Dragon voice recognition software and may include unintentional dictation errors.   Irean Hong, MD 01/27/20 [phone]

## 2020-01-27 NOTE — ED Notes (Signed)
CT at bedside to bring pt to CT.

## 2020-01-27 NOTE — ED Notes (Signed)
Pt states oxygen does not feel like it is 'turned up enough'. Placement of nasal cannula in proper place. Oxygen increased to 6L due to pt work of breathing.

## 2020-01-27 NOTE — ED Notes (Signed)
Pt hooked back up to cardiac monitor and BP cuff following CT. After BP taken, pt takes off cuff. Pt declines ativan at this time stating "they keep thinking it's withdrawals, but its not withdrawals." Pt with visible tremors and reports 9/10 headache. CIWA filed. Pt provided with recliner per request. Pt requests CPAP so he can take a nap, will call RT when available to do so   Pt does say he feels like he could use a breathing treatment or inhaler. Inhaler not present with pt meds in drug cart, pharmacy messaged regarding missing inhaler

## 2020-01-27 NOTE — ED Notes (Addendum)
Pharmacy messaged regarding missing warfarin/albuterol inhaler  Pt to bedside recliner, changed from cpap to [place]

## 2020-01-27 NOTE — H&P (Signed)
History and Physical    [name] [name] GGY:[id number] DOB: 05/26/1960 DOA: 01/27/2020  PCP: [name], DO   Patient coming from: Home  I have personally briefly reviewed patient's old medical records in [facility]  Chief Complaint: Shortness of breath  HPI: [name] [name] is a 59 y.o. male with medical history significant for diastolic CHF, COPD on home O2 at 3 L, OSA on CPAP, morbid obesity, severe alcohol use disorder, chronic A. fib, history of PE on Coumadin, who presents to the emergency room with sudden onset shortness of breath that woke him up from sleep about 2 hours prior to arrival in the ER.  He has associated retrosternal chest tightness of moderate intensity, nonradiating, with no aggravating or alleviating factors.  Has Associated nausea but without vomiting or diaphoresis.  Denies cough, fever or chills.  Denies abdominal pain vomiting and diarrhea ED Course: On arrival, he had increased work of breathing, afebrile, tachycardic at 126, tachypneic at 26 with O2 sats 96% on 6 L.  Blood work significant for BNP of 279, first troponin of 62.  CBC and CMP mostly unremarkable.  EtOH level less than 10.  Covid and flu negative. EKG as reviewed by me : A. fib with rate of 127, no acute ST-T wave changes Imaging: Chest x-ray showing cardiomegaly with mild diffuse pulmonary interstitial congestion/edema While in the emergency room patient was noted to have a mild tremor on CIWA protocol was initiated.  He was treated with IV Lasix for CHF exacerbation.  Hospitalist consulted for admission.  Review of Systems: As per HPI otherwise all other systems on review of systems negative.    Past Medical History:  Diagnosis Date  . Allergy   . Anxiety   . Arthritis   . Asthma   . Brain damage   . Chronic pain of both knees 07/13/2018  . Clotting disorder (HCC)   . COPD (chronic obstructive pulmonary disease) (HCC)   . Depression   . GERD (gastroesophageal reflux  disease)   . HFrEF (heart failure with reduced ejection fraction) (HCC)    a. 03/2018 Echo: EF 25-30%, diff HK. Mod LAE.  [name] History of DVT (deep vein thrombosis)   . History of pulmonary embolism    a. Chronic coumadin.  [name] Hypertension   . MI (myocardial infarction) (HCC)   . Morbid obesity (HCC)   . Neck pain 07/13/2018  . NICM (nonischemic cardiomyopathy) (HCC)    a. s/p Cath x 3 - reportedly nl cors. Last cath 2019 in [place]; b. a. 03/2018 Echo: EF 25-30%, diff HK.  [name] Persistent atrial fibrillation (HCC)    a. 03/2018 s/p DCCV; b. CHA2DS2VASc = 1-->Xarelto (later changed to warfarin); c. 05/2018 recurrent afib-->Amio initiated.  . Sleep apnea   . Sleep apnea     Past Surgical History:  Procedure Laterality Date  . CARDIOVERSION N/A 03/24/2018   Procedure: CARDIOVERSION;  Surgeon: Antonieta Iba, MD;  Location: [facility] ORS;  Service: Cardiovascular;  Laterality: N/A;  . CARDIOVERSION N/A 08/08/2018   Procedure: CARDIOVERSION;  Surgeon: [name], MD;  Location: [facility] ORS;  Service: Cardiovascular;  Laterality: N/A;  . hearnia repair     X 3- total of two surgeries  . HERNIA REPAIR    . LEG SURGERY       reports that he has never smoked. He has never used smokeless tobacco. He reports current alcohol use. He reports that he does not use drugs.  No Known Allergies  Family History  Problem Relation Age of Onset  . Heart failure Mother   . Lung cancer Mother   . Lung cancer Father   . Heart attack Maternal Grandmother   . Heart attack Maternal Grandfather       Prior to Admission medications   Medication Sig Start Date End Date Taking? Authorizing Provider  albuterol (VENTOLIN HFA) 108 (90 Base) MCG/ACT inhaler Inhale 2 puffs into the lungs every 4 (four) hours as needed for wheezing or shortness of breath. 10/25/19   Karamalegos, Netta Neat, DO  allopurinol (ZYLOPRIM) 100 MG tablet TAKE 1 TABLET BY MOUTH ONCE DAILY Patient not taking: Reported on 01/18/2020 08/15/19   Flinchum,  [name], FNP  amiodarone (PACERONE) 200 MG tablet Take 1 tablet (200 mg total) by mouth 2 (two) times daily. 01/12/20   Enedina Finner, MD  aspirin 325 MG tablet Take 325 mg by mouth daily.    [provider]  atorvastatin (LIPITOR) 40 MG tablet TAKE 1 TABLET BY MOUTH ONCE DAILY Patient taking differently: Take 40 mg by mouth daily.  08/15/19   Flinchum, [name], FNP  cephALEXin (KEFLEX) 500 MG capsule Take 500 mg by mouth 3 (three) times daily.    [provider]  DULoxetine (CYMBALTA) 20 MG capsule Take 1 capsule (20 mg total) by mouth daily. 10/25/19   Karamalegos, Netta Neat, DO  ferrous sulfate 325 (65 FE) MG tablet Take 325 mg by mouth 2 (two) times daily.    [provider]  fluticasone (FLONASE) 50 MCG/ACT nasal spray Place 2 sprays into both nostrils 2 (two) times daily.     [provider]  furosemide (LASIX) 40 MG tablet Take 1 tablet (40 mg total) by mouth 2 (two) times daily. 01/12/20 01/11/21  Enedina Finner, MD  HYDROcodone-acetaminophen (NORCO) 7.5-325 MG tablet Take 1 tablet by mouth every 8 (eight) hours as needed for moderate pain or severe pain. 12/28/19   Lurene Shadow, MD  levothyroxine (SYNTHROID) 75 MCG tablet Take 1 tablet (75 mcg total) by mouth daily before breakfast. 01/13/20   Enedina Finner, MD  loperamide (IMODIUM) 2 MG capsule Take 1 capsule (2 mg total) by mouth as needed for diarrhea or loose stools. 06/26/19   Arnetha Courser, MD  magnesium oxide (MAG-OX) 400 (241.3 Mg) MG tablet Take 1 tablet (400 mg total) by mouth 2 (two) times daily. 01/12/20   Enedina Finner, MD  metoprolol succinate (TOPROL-XL) 50 MG 24 hr tablet Take 2 tablets (100 mg total) by mouth daily. Take with or immediately following a meal. 01/12/20   Enedina Finner, MD  montelukast (SINGULAIR) 10 MG tablet Take 1 tablet (10 mg total) by mouth at bedtime. 10/12/19   Karamalegos, Netta Neat, DO  Multiple Vitamin (MULTIVITAMIN WITH MINERALS) TABS tablet Take 1 tablet by mouth  daily.    [provider]  naphazoline-glycerin (CLEAR EYES REDNESS) 0.012-0.2 % SOLN Place 1-2 drops into both eyes 4 (four) times daily as needed for eye irritation. 08/14/19   Alford Highland, MD  nitroGLYCERIN (NITROSTAT) 0.4 MG SL tablet Place 1 tablet under tongue every 5 minutes as needed for chest pain. (No more than 3 doses within 15 minutes) 03/08/19   Flinchum, [name], FNP  potassium chloride (KLOR-CON) 20 MEQ packet Take 40 mEq by mouth daily. 01/12/20   Enedina Finner, MD  SUMAtriptan (IMITREX) 50 MG tablet Take 1 tablet (50 mg total) by mouth every 2 (two) hours as needed for migraine or headache. May repeat in 2 hours if headache persists or  recurs. No more than 2 doses in a day 12/28/19   Lurene Shadow, MD  thiamine 100 MG tablet Take 1 tablet (100 mg total) by mouth daily. 08/14/19   Alford Highland, MD  traZODone (DESYREL) 150 MG tablet Take 1 tablet (150 mg total) by mouth at bedtime. 10/25/19   Karamalegos, Netta Neat, DO  warfarin (COUMADIN) 5 MG tablet Take 5 mg by mouth daily.  09/20/19   [provider]    Physical Exam: Vitals:   01/27/20 0318 01/27/20 0335 01/27/20 0346 01/27/20 0409  BP: (!) 161/109 (!) 148/127  (!) 157/88  Pulse: (!) 126 (!) 115 100 (!) 135  Resp: (!) 32  (!) 26 (!) 26  Temp: 98.9 F (37.2 C)     TempSrc: Oral     SpO2: 96%  97% 96%  Weight:      Height:         Vitals:   01/27/20 0318 01/27/20 0335 01/27/20 0346 01/27/20 0409  BP: (!) 161/109 (!) 148/127  (!) 157/88  Pulse: (!) 126 (!) 115 100 (!) 135  Resp: (!) 32  (!) 26 (!) 26  Temp: 98.9 F (37.2 C)     TempSrc: Oral     SpO2: 96%  97% 96%  Weight:      Height:          Constitutional: Alert and oriented x 3 . Appears anxious, tachypneic, tremulous HEENT:      Head: Normocephalic and atraumatic.         Eyes: PERLA, EOMI, Conjunctivae are normal. Sclera is non-icteric.       Mouth/Throat: Mucous membranes are moist.       Neck: Supple with no signs of  meningismus. Cardiovascular:  Irregularly irregular, tachycardic. No murmurs, gallops, or rubs. 2+ symmetrical distal pulses are present . No JVD. Hard nonpitting bilateral lower extremity edema  respiratory: Respiratory effort increased. Lung sounds diminished bilaterally, bibasilar crackles Gastrointestinal: Soft, non tender, and non distended with positive bowel sounds.  Genitourinary: No CVA tenderness. Musculoskeletal: Nontender with normal range of motion in all extremities. Swelling erythema bilateral lower extremity edema Neurologic:  Face is symmetric. Moving all extremities. No gross focal neurologic deficits . Skin:  Redness bilateral lower extremities Psychiatric: Anxious, tremulous   Labs on Admission: I have personally reviewed following labs and imaging studies  CBC: Recent Labs  Lab 01/27/20 0319  WBC 8.4  NEUTROABS 5.9  HGB 12.0*  HCT 37.8*  MCV 92.4  PLT 296   Basic Metabolic Panel: Recent Labs  Lab 01/27/20 0319  NA 136  K 3.7  CL 94*  CO2 25  GLUCOSE 172*  BUN 6  CREATININE 0.94  CALCIUM 7.8*   GFR: Estimated Creatinine Clearance: 138.1 mL/min (by C-G formula based on SCr of 0.94 mg/dL). Liver Function Tests: Recent Labs  Lab 01/27/20 0319  AST 57*  ALT 31  ALKPHOS 107  BILITOT 1.0  PROT 7.2  ALBUMIN 3.3*   No results for input(s): LIPASE, AMYLASE in the last 168 hours. No results for input(s): AMMONIA in the last 168 hours. Coagulation Profile: Recent Labs  Lab 01/27/20 0319  INR 1.2   Cardiac Enzymes: No results for input(s): CKTOTAL, CKMB, CKMBINDEX, TROPONINI in the last 168 hours. BNP (last 3 results) No results for input(s): PROBNP in the last 8760 hours. HbA1C: No results for input(s): HGBA1C in the last 72 hours. CBG: No results for input(s): GLUCAP in the last 168 hours. Lipid Profile: No results for input(s): CHOL,  HDL, LDLCALC, TRIG, CHOLHDL, LDLDIRECT in the last 72 hours. Thyroid Function Tests: No results for  input(s): TSH, T4TOTAL, FREET4, T3FREE, THYROIDAB in the last 72 hours. Anemia Panel: No results for input(s): VITAMINB12, FOLATE, FERRITIN, TIBC, IRON, RETICCTPCT in the last 72 hours. Urine analysis:    Component Value Date/Time   COLORURINE YELLOW (A) 01/07/2020 1535   APPEARANCEUR CLOUDY (A) 01/07/2020 1535   APPEARANCEUR Hazy (A) 12/25/2018 1442   LABSPEC 1.009 01/07/2020 1535   PHURINE 7.0 01/07/2020 1535   GLUCOSEU NEGATIVE 01/07/2020 1535   HGBUR MODERATE (A) 01/07/2020 1535   BILIRUBINUR NEGATIVE 01/07/2020 1535   BILIRUBINUR Negative 12/25/2018 1442   KETONESUR NEGATIVE 01/07/2020 1535   PROTEINUR NEGATIVE 01/07/2020 1535   NITRITE NEGATIVE 01/07/2020 1535   LEUKOCYTESUR TRACE (A) 01/07/2020 1535    Radiological Exams on Admission: DG Chest Port 1 View  Result Date: 01/27/2020 CLINICAL DATA:  Initial evaluation for acute shortness of breath. EXAM: PORTABLE CHEST 1 VIEW COMPARISON:  Prior radiograph from 01/07/2020. FINDINGS: Cardiomegaly, stable.  Mediastinal silhouette within normal limits. Lungs mildly hypoinflated. Diffuse pulmonary vascular and interstitial congestion, suggesting mild pulmonary interstitial congestion/edema. No visible pleural effusion. No focal infiltrates. No pneumothorax. No acute osseous finding. IMPRESSION: Cardiomegaly with mild diffuse pulmonary interstitial congestion/edema. Electronically Signed   By: Rise Mu M.D.   On: 01/27/2020 04:03     Assessment/Plan 59 year old male with history of diastolic CHF, COPD on home O2 at 3 L, OSA on CPAP, morbid obesity, severe alcohol use disorder, chronic A. fib, history of PE on Coumadin, presenting with acute dyspnea with increased O2 requirement of 6 L.    Acute on chronic respiratory failure with hypoxia (HCC) -Patient presenting with acute dyspnea, increased work of breathing, speaking in short sentences with increased O2 requirement up to 6 L to maintain sats in the low to mid  90s -Suspect multifactorial related to CHF, A. fib with RVR.  Covid and flu negative and no infiltrate on chest x-ray.  History of PE on INR is subtherapeutic for PE . -Treat potential etiologies below    Acute on chronic diastolic CHF (congestive heart failure) (HCC) -BNP elevated with chest x-ray showing pulmonary vascular congestion -IV Lasix.  Continue home beta-blocker.  Not currently on ACE/ARB -Daily weights with intake and output monitoring -Echocardiogram on 11/7 showed EF 55 to 60%    Atrial fibrillation with RVR (HCC) -EKG with A. fib with RVR of 127, likely due to acute dyspnea as well as alcohol withdrawal -Appears to have improved following a dose of Ativan per CIWA protocol -Continue to monitor, will consider bolus diltiazem if rate goes back up -Continue Coumadin for stroke prevention -Continue home metoprolol for now    Alcohol withdrawal syndrome without complication (HCC) -Patient drinks a pint of vodka daily -EtOH level less than 10 and patient with tremors and tachycardic -CIWA withdrawal protocol    HTN (hypertension) -Continue home meds    Morbid obesity with BMI of 60.0-69.9, adult (HCC) -Complicating factor to overall prognosis and care    History of pulmonary embolism (on Coumadin) -Pharmacy consult for Coumadin -INR subtherapeutic at 1.2 -Full dose Lovenox until INR therapeutic    CAD (coronary artery disease) -Troponin elevated at 62 suspect related to demand -Continue to trend as patient was having some chest discomfort though likely related to acute respiratory distress  OSA on CPAP -CPAP nightly  Bilateral lower extremity venous stasis -Keep legs elevated    DVT prophylaxis: Coumadin/full dose Lovenox Code Status: full code  Family Communication:  none  Disposition Plan: Back to previous home environment Consults called: none  Status:At the time of admission, it appears that the appropriate admission status for this patient is INPATIENT.  This is judged to be reasonable and necessary in order to provide the required intensity of service to ensure the patient's safety given the presenting symptoms, physical exam findings, and initial radiographic and laboratory data in the context of their  Comorbid conditions.   Patient requires inpatient status due to high intensity of service, high risk for further deterioration and high frequency of surveillance required.   I certify that at the point of admission it is my clinical judgment that the patient will require inpatient hospital care spanning beyond 2 midnights     Andris Baumann MD [facility]     01/27/2020, 4:39 AM

## 2020-01-27 NOTE — ED Notes (Signed)
Pt assisted into bed, CPAP applied

## 2020-01-27 NOTE — Progress Notes (Signed)
ANTICOAGULATION CONSULT NOTE  Pharmacy Consult for Warfarin with Lovenox [place] Indication: Afib and PE  No Known Allergies  Patient Measurements: Height: 5\' 8"  (172.7 cm) Weight: (!) 186 kg (410 lb) IBW/kg (Calculated) : 68.4   Vital Signs: Temp: 98.9 F (37.2 C) (12/12 0318) Temp Source: Oral (12/12 0318) BP: 168/137 (12/12 0455) Pulse Rate: 104 (12/12 0455)  Labs: Recent Labs    01/27/20 0319  HGB 12.0*  HCT 37.8*  PLT 296  LABPROT 14.9  INR 1.2  CREATININE 0.94  TROPONINIHS 62*    Estimated Creatinine Clearance: 138.1 mL/min (by C-G formula based on SCr of 0.94 mg/dL).   Medical History: Past Medical History:  Diagnosis Date  . Allergy   . Anxiety   . Arthritis   . Asthma   . Brain damage   . Chronic pain of both knees 07/13/2018  . Clotting disorder (HCC)   . COPD (chronic obstructive pulmonary disease) (HCC)   . Depression   . GERD (gastroesophageal reflux disease)   . HFrEF (heart failure with reduced ejection fraction) (HCC)    a. 03/2018 Echo: EF 25-30%, diff HK. Mod LAE.  04/2018 History of DVT (deep vein thrombosis)   . History of pulmonary embolism    a. Chronic coumadin.  [name] Hypertension   . MI (myocardial infarction) (HCC)   . Morbid obesity (HCC)   . Neck pain 07/13/2018  . NICM (nonischemic cardiomyopathy) (HCC)    a. s/p Cath x 3 - reportedly nl cors. Last cath 2019 in [place]; b. a. 03/2018 Echo: EF 25-30%, diff HK.  04/2018 Persistent atrial fibrillation (HCC)    a. 03/2018 s/p DCCV; b. CHA2DS2VASc = 1-->Xarelto (later changed to warfarin); c. 05/2018 recurrent afib-->Amio initiated.  . Sleep apnea   . Sleep apnea     Medications:  PTA - Coumadin 5mg  daily  Assessment:  12/12 INR subtherapeutic  Goal of Therapy:   INR Goal 2-3  Plan:  Ordered Lovenox 1 mg/kg Q12hr.  Ordered Warfarin 7.5mg  at 1600 today.  Will continue Lovenox, order INR, and adjust warfarin dose, if warranted, daily until INR is therapeutic x 2 days.  Will continue to monitor  SCr, H&H, and Platelets.    06/2018, PharmD, [facility] 01/27/2020 5:21 AM

## 2020-01-27 NOTE — ED Notes (Signed)
Pt reports tremors have been going on for "about 5 years now."  Does not report that he knows the cause of them. Denies known medication side effect, liver problems, or other known contributing factors.   Reports mild relief to headache and nausea

## 2020-01-27 NOTE — TOC Initial Note (Signed)
Transition of Care [facility]) - Initial/Assessment Note    Patient Details  Name: [name] [name] MRN: [id number] Date of Birth: [birth year]  Transition of Care [facility]) CM/SW Contact:    Berenice Bouton, LCSW Phone Number: 01/27/2020, 3:54 PM  Clinical Narrative: CSW met with patient at bedside to discussed discharge plan. Social worker explained to the patient reason for the consult. CSW explain HIPPA.    Patient is a 59 year old Caucasian male who presented to [facility] ED via EMS from home due to waking up out of sleep with SOB and chest pain. Patient is on O2 at 3 liters. Patient uses a c-pap machine. Patient lives alone. He attends to all ADL's but would like home health upon hospital discharge. He is agreeable to SNF should he needs to discharge to a SNF but noted that "I don't want to share a room with anyone." CSW educated patient on the process of searching for skills nursing facility. Patient admitted to [facility] inpatient. Waiting for a bed. Plan: SW will continue to follow this patient.   PCP is Nobie Putnam, DO.  Current services: Medicaid Transportation  Pharmacy: [facility]. Oxygen: [employer] 3L             Expected Discharge Plan: [place] Barriers to Discharge: Continued Medical Work up   Patient Goals and CMS Choice Patient states their goals for this hospitalization and ongoing recovery are:: Go hojme with home health.  He stated that he used [facility] in the past. CMS Medicare.gov Compare Post Acute Care list provided to:: Patient Choice offered to / list presented to : Patient  Expected Discharge Plan and Services Expected Discharge Plan: [place] In-house Referral: Clinical Social Work   Post Acute Care Choice: [place] arrangements for the past 2 months: [place]                 DME Arranged: Oxygen (O2 3L) DME Agency:  Physicist, medical)     Prior Living Arrangements/Services Living arrangements for the  past 2 months: [place] with:: Self Patient language and need for interpreter reviewed:: No        Need for Family Participation in Patient Care: No (Comment) Care giver support system in place?: No (comment) Current home services: DME Criminal Activity/Legal Involvement Pertinent to Current Situation/Hospitalization: No - Comment as needed  Activities of Daily Living      Permission Sought/Granted Permission sought to share information with : Case Manager       Permission granted to share info w AGENCY: [place]        Emotional Assessment Appearance:: Appears older than stated age Attitude/Demeanor/Rapport: Engaged,Self-Confident,Gracious   Orientation: : Oriented to Self,Oriented to Place,Oriented to  Time,Oriented to Situation Alcohol / Substance Use: Not Applicable Psych Involvement: No (comment)  Admission diagnosis:  CHF (congestive heart failure) ([place]) [I50.9] Patient Active Problem List   Diagnosis Date Noted  . CHF (congestive heart failure) ([place]) 01/27/2020  . Community acquired pneumonia 12/25/2019  . Shortness of breath 12/22/2019  . Atrial fibrillation, chronic ([place]) 12/22/2019  . Class 3 obesity with alveolar hypoventilation, serious comorbidity, and body mass index (BMI) of 60.0 to 69.9 in adult ([place]) 12/22/2019  . GAD (generalized anxiety disorder) 10/12/2019  . Alcohol withdrawal syndrome without complication ([place])   . Lower extremity ulceration, unspecified laterality, with fat layer exposed ([place]) 08/09/2019  . Alcohol abuse   . Pressure injury of skin 06/26/2019  . Iron  deficiency anemia 06/22/2019  . Depression 06/22/2019  . Elevated troponin 06/22/2019  . Left-sided Bell's palsy 05/08/2019  . Chronic intractable headache 05/03/2019  . Head injuries, initial encounter 05/03/2019  . Noncompliance by refusing service 05/03/2019  . Facial droop 05/02/2019  . Pharmacologic therapy 04/11/2019  . Disorder of skeletal system  04/11/2019  . Problems influencing health status 04/11/2019  . Chronic anticoagulation (warfarin  COUMADIN) 04/11/2019  . Elevated sed rate 04/11/2019  . Elevated C-reactive protein (CRP) 04/11/2019  . Elevated hemoglobin A1c 04/11/2019  . Hypoalbuminemia 04/11/2019  . Edema due to hypoalbuminemia 04/11/2019  . Elevated brain natriuretic peptide (BNP) level 04/11/2019  . Chronic hip pain (Right) 04/11/2019  . Osteoarthritis of hip (Right) 04/11/2019  . Atrial fibrillation with RVR ([place]) 03/08/2019  . Degenerative joint disease of right hip 03/05/2019  . Hypothyroidism 03/04/2019  . Chronic venous stasis dermatitis of both lower extremities 03/04/2019  . Long term (current) use of anticoagulants 02/23/2019  . PE (pulmonary thromboembolism) ([place]) 01/21/2019  . HLD (hyperlipidemia) 01/21/2019  . CAD (coronary artery disease) 01/21/2019  . Cellulitis of right lower extremity 01/21/2019  . Noncompliance 01/09/2019  . Acquired thrombophilia ([place]) 11/28/2018  . Major depressive disorder, recurrent episode, in partial remission ([place]) 09/17/2018  . Chest pain 08/06/2018  . Personal history of DVT (deep vein thrombosis) 07/13/2018  . History of pulmonary embolism (on Coumadin) 07/13/2018  . Neck pain 07/13/2018  . Chronic low back pain (Bilateral)  w/ sciatica (Bilateral) 07/13/2018  . TBI (traumatic brain injury) ([place]) 07/04/2018  . Abnormal thyroid blood test 06/27/2018  . Type 2 diabetes mellitus with other specified complication ([place]) 40/45/9136  . HTN (hypertension) 06/06/2018  . Chronic gout involving toe, unspecified cause, unspecified laterality 06/06/2018  . Morbid obesity with BMI of 60.0-69.9, adult ([place]) 06/06/2018  . Chronic pain syndrome 06/06/2018  . Ulcers of both lower extremities, limited to breakdown of skin ([place]) 06/06/2018  . Gout 06/06/2018  . Persistent atrial fibrillation ([place])   . Severe sepsis with septic shock ([place]) 03/17/2018  . Acute on chronic diastolic CHF  (congestive heart failure) ([place]) 02/02/2018  . Centrilobular emphysema ([place]) 02/02/2018  . OSA (obstructive sleep apnea) 02/02/2018  . Lymphedema 02/02/2018  . Acute on chronic respiratory failure with hypoxia (HCC) 01/27/2018   PCP:  [name], DO Pharmacy:   [facility], [place] [address] Phone: [phone] Fax: [phone]     Social Determinants of Health (SDOH) Interventions    Readmission Risk Interventions Readmission Risk Prevention Plan 01/11/2020 12/24/2019 01/25/2019  Transportation Screening Complete Complete Complete  PCP or Specialist Appt within 3-5 Days - - -  HRI or [place] for [place] - - -  Medication Review ([place]) Complete Complete Complete  PCP or Specialist appointment within 3-5 days of discharge Complete Complete Complete  [place] or [place] - - Complete  SW Recovery Care/Counseling Consult Complete Complete Complete  Palliative Care Screening Complete Not Applicable Not [place] Not Applicable Not Applicable Not Applicable

## 2020-01-27 NOTE — ED Notes (Signed)
Pt in CT.

## 2020-01-27 NOTE — ED Notes (Addendum)
Pt work of breathing less labored at this time. Oxygen weaned from 6L [name] to 4L [name]. Pt tolerated well. Oxygen saturatons maintaining between 93-96 percent on 4L. Work of breathing unchanged. Pt also requesting nausea medication at this time.

## 2020-01-27 NOTE — ED Notes (Signed)
Pt requesting to have ace bandages around calves removed. Removed with sheers by this RN. Pt requesting to have socks removed as well.

## 2020-01-27 NOTE — ED Notes (Signed)
Pt requesting pain medication for pain in back and buttocks from being on stretcher.

## 2020-01-27 NOTE — ED Notes (Signed)
Pt provided with 2% milk and applesauce per request

## 2020-01-27 NOTE — ED Notes (Signed)
Called pharmacy. They state they had sent the inhaler originally around noon. We have not received anything to the ED. Main, CPOD, Flex, and drug carts all checked with no inhaler. Pharmacy states "you guys must be losing them up there, but I'll send another one."

## 2020-01-27 NOTE — ED Notes (Signed)
Pillow placed behind pt back per request.

## 2020-01-27 NOTE — ED Notes (Signed)
Pt reports last drink was 01-25-2020

## 2020-01-27 NOTE — ED Notes (Signed)
Pt states "I feel like I need a breathing treatment." Lungs clear on auscultation. Sounds bilaterally clear/diminished.

## 2020-01-28 LAB — PROTIME-INR
INR: 1.2 (ref 0.8–1.2)
Prothrombin Time: 14.9 seconds (ref 11.4–15.2)

## 2020-01-28 LAB — BASIC METABOLIC PANEL
Anion gap: 11 (ref 5–15)
BUN: 8 mg/dL (ref 6–20)
CO2: 34 mmol/L — ABNORMAL HIGH (ref 22–32)
Calcium: 7.7 mg/dL — ABNORMAL LOW (ref 8.9–10.3)
Chloride: 90 mmol/L — ABNORMAL LOW (ref 98–111)
Creatinine, Ser: 0.95 mg/dL (ref 0.61–1.24)
GFR, Estimated: >60 mL/min (ref >60)
Glucose, Bld: 123 mg/dL — ABNORMAL HIGH (ref 70–99)
Potassium: 3.6 mmol/L (ref 3.5–5.1)
Sodium: 135 mmol/L (ref 135–145)

## 2020-01-28 LAB — CBG MONITORING, ED
Glucose-Capillary: 115 mg/dL — ABNORMAL HIGH (ref 70–99)
Glucose-Capillary: 130 mg/dL — ABNORMAL HIGH (ref 70–99)
Glucose-Capillary: 119 mg/dL — ABNORMAL HIGH (ref 70–99)

## 2020-01-28 LAB — TROPONIN I (HIGH SENSITIVITY): Troponin I (High Sensitivity): 37 ng/L — ABNORMAL HIGH (ref <18)

## 2020-01-28 MED ORDER — SODIUM CHLORIDE 0.9 % IV SOLN: 2.0000 g | Freq: Two times a day (BID) | INTRAVENOUS | Status: DC

## 2020-01-28 MED ORDER — FUROSEMIDE 10 MG/ML IJ SOLN
60.0000 mg | Freq: Two times a day (BID) | INTRAMUSCULAR | Status: DC
  Administered 2020-01-28 – 2020-02-02 (×10): 60 mg via INTRAVENOUS
  Filled 2020-01-28 (×5): qty 6
  Filled 2020-01-28: qty 8
  Filled 2020-01-28 (×3): qty 6

## 2020-01-28 MED ORDER — WARFARIN SODIUM 10 MG PO TABS
10.0000 mg | ORAL_TABLET | Freq: Once | ORAL | Status: AC
  Administered 2020-01-28: 18:00:00 10 mg via ORAL
  Filled 2020-01-28: qty 1

## 2020-01-28 MED ORDER — SODIUM CHLORIDE 0.9 % IV SOLN
2.0000 g | INTRAVENOUS | Status: DC
  Administered 2020-01-28 – 2020-01-29 (×2): 2 g via INTRAVENOUS
  Filled 2020-01-28: qty 20
  Filled 2020-01-28: qty 2

## 2020-01-28 NOTE — ED Notes (Signed)
Pt sleeping soundly, even and unlabored respirations on CPAP, light dimmed

## 2020-01-28 NOTE — ED Notes (Addendum)
Pt given t warm blankets and 2 whole milks to drink, declined food

## 2020-01-28 NOTE — ED Notes (Signed)
This RN assisted pt back to bed to rest. CPAP mask in place. Pt voiced no further issues.

## 2020-01-28 NOTE — Progress Notes (Signed)
PROGRESS NOTE    [name] [name]  NWG:[id number] DOB: 1960-11-12 DOA: 01/27/2020 PCP: [name], DO  Outpatient Specialists: [facility]    Brief Narrative:   [name] [name] is a 59 y.o. male with medical history significant for diastolic CHF, COPD on home O2 at 3 L, OSA on CPAP, morbid obesity, severe alcohol use disorder, chronic A. fib, history of PE on Coumadin, who presents to the emergency room with sudden onset shortness of breath that woke him up from sleep about 2 hours prior to arrival in the ER.  He has associated retrosternal chest tightness of moderate intensity, nonradiating, with no aggravating or alleviating factors.  Has Associated nausea but without vomiting or diaphoresis.  Denies cough, fever or chills.  Denies abdominal pain vomiting and diarrhea While in the emergency room patient was noted to have a mild tremor on CIWA protocol was initiated.  He was treated with IV Lasix for CHF exacerbation.  Hospitalist consulted for admission.   Assessment & Plan:   Principal Problem:   Acute on chronic respiratory failure with hypoxia (HCC) Active Problems:   Acute on chronic diastolic CHF (congestive heart failure) (HCC)   HTN (hypertension)   Morbid obesity with BMI of 60.0-69.9, adult (HCC)   History of pulmonary embolism (on Coumadin)   Atrial fibrillation with RVR (HCC)   CAD (coronary artery disease)   Long term (current) use of anticoagulants   Alcohol withdrawal syndrome without complication (HCC)   CHF (congestive heart failure) (HCC)  # Acute on chronic respiratory failure with hypoxia (HCC) Patient presenting with acute dyspnea, increased work of breathing, speaking in short sentences with increased O2 requirement up to 6 L to maintain sats in the low to mid 90s, now down to 4 L (home is 3). Respiratory status has improved but is not back to baseline, still feeling short of breath. Likely driven by a fib w/ rvr and resulting diastolic chf  exacerbation. Covid and flu negative and no infiltrate on chest x-ray. Does not appear to be a COPD exacerbation. CTA neg for PE - Treat potential etiologies below  # Atrial fibrillation w/ RVR # Acute on chronic diastolic CHF (congestive heart failure) (HCC) Here in a fib with rvr on presentation, now regular rate. UOP recorded 1400 last 24 hours. Kidney function stable. - increase lasix to 60 IV bid for home 40 po bid - continue home metoprolol and amiodarone - cardiology consulted, appreciate recs - warfarin per pharmacy, admitting provider started lovenox which I will continue at 1 mg/kg bid.  # Alcohol abuse Patient says last drink was 12/10, prior last drink was 3 weeks prior to that. Has intention tremor but denies anxiety, tachycardia is improved, do not think is withdrawing and patient denies history of withdrawal   - cont ciwa for now - cont thiamine  # HTN (hypertension) Here bp elevated - cont home metoprolol - continue lasix, dose increased - oral hydral prn  # Morbid obesity with BMI of 60.0-69.9, adult (HCC) - Complicating factor to overall prognosis and care  # History of pulmonary embolism (on Coumadin) INR subtherapeutic. Binge drinking contributing. Patient says failed NOAC had break-through PE leading to decision to start coumadin - Pharmacy consult for Coumadin - Full dose Lovenox for now  # Chest pain Suspect 2/2 rvr. Mild trop elevation mild up-trend to 90 yesterday, likely demand, ekg w/o obvious ischemic changes. . - tele - monitor, repeat trop today to confirm plateau - cont aspirin (81 for home 325), atorva  #  Chronic pain - cont home cymbalta, hydromorphone prn  # Chronic hypokalemia - cont home kcl 40 qd  # OSA on CPAP - CPAP nightly  # Hypothyroid Home synthroid recently increased last month. tsh elevated here - cont home synthroid, will need repeat tsh in a few weeks  # T2DM a1c 6.2 10/2019. Here glucose mildly elevated - SSI  moderate  # Insomnia - cont home trazodone  # Bilateral lower extremity venous stasis dermatitis.  Patient says warmth and redness have worsened. Likely dermatitis but prudent to cover for poss cellulitis - start ceftriaxone 2 g qd (12/13>  - Keep legs elevated, order for compression bandage   DVT prophylaxis: lovenox therapeutic Code Status: full Family Communication: none @ bedside  Status is: Inpatient  Remains inpatient appropriate because:Inpatient level of care appropriate due to severity of illness   Dispo: The patient is from: Home              Anticipated d/c is to: Home              Anticipated d/c date is: 1-2 days              Patient currently is not medically stable to d/c.     Consultants:  cardiology  Procedures: none  Antimicrobials:  none    Subjective: This mornign says still sob but improved, has mild cough. Legs more red and warm he says this is new. No fevers. Decreased appetite, no n/v/d.   Objective: Vitals:   01/27/20 2100 01/27/20 2110 01/27/20 2300 01/28/20 0411  BP: (!) 148/115 (!) 148/115  (!) 159/111  Pulse: 96 96  95  Resp:  (!) 24 (!) 8 16  Temp:  98.2 F (36.8 C)    TempSrc:  Oral    SpO2:  99%  99%  Weight:      Height:        Intake/Output Summary (Last 24 hours) at 01/28/2020 1017 Last data filed at 01/28/2020 0600 Gross per 24 hour  Intake --  Output 1375 ml  Net -1375 ml   Filed Weights   01/27/20 0315  Weight: (!) 186 kg    Examination:  General exam: obese, chronically ill appearing Respiratory system: Clear to auscultation. Rales at bases Cardiovascular system: distant heart sounds, faint s1/s2 heard Gastrointestinal system: Abdomen is obese, soft and nontender. No organomegaly or masses felt. Normal bowel sounds heard. Central nervous system: Alert and oriented. No focal neurological deficits. Extremities: Symmetric 5 x 5 power. Skin: LE edema and erythema of distal extremities. erythema to upper  calves Psychiatry: Judgement and insight appear normal. Mood & affect appropriate.     Data Reviewed: I have personally reviewed following labs and imaging studies  CBC: Recent Labs  Lab 01/27/20 0319  WBC 8.4  NEUTROABS 5.9  HGB 12.0*  HCT 37.8*  MCV 92.4  PLT 296   Basic Metabolic Panel: Recent Labs  Lab 01/27/20 0319 01/27/20 0454 01/28/20 0442  NA 136  --  135  K 3.7  --  3.6  CL 94*  --  90*  CO2 25  --  34*  GLUCOSE 172*  --  123*  BUN 6  --  8  CREATININE 0.94  --  0.95  CALCIUM 7.8*  --  7.7*  MG  --  1.5*  --   PHOS  --  2.3*  --    GFR: Estimated Creatinine Clearance: 136.7 mL/min (by C-G formula based on SCr of 0.95 mg/dL). Liver  Function Tests: Recent Labs  Lab 01/27/20 0319  AST 57*  ALT 31  ALKPHOS 107  BILITOT 1.0  PROT 7.2  ALBUMIN 3.3*   No results for input(s): LIPASE, AMYLASE in the last 168 hours. No results for input(s): AMMONIA in the last 168 hours. Coagulation Profile: Recent Labs  Lab 01/27/20 0319 01/28/20 0442  INR 1.2 1.2   Cardiac Enzymes: No results for input(s): CKTOTAL, CKMB, CKMBINDEX, TROPONINI in the last 168 hours. BNP (last 3 results) No results for input(s): PROBNP in the last 8760 hours. HbA1C: Recent Labs    01/27/20 0454  HGBA1C 6.7*   CBG: Recent Labs  Lab 01/27/20 0941 01/27/20 1127 01/27/20 1658 01/28/20 0747  GLUCAP 136* 121* 138* 115*   Lipid Profile: No results for input(s): CHOL, HDL, LDLCALC, TRIG, CHOLHDL, LDLDIRECT in the last 72 hours. Thyroid Function Tests: Recent Labs    01/27/20 0956  TSH 12.805*   Anemia Panel: No results for input(s): VITAMINB12, FOLATE, FERRITIN, TIBC, IRON, RETICCTPCT in the last 72 hours. Urine analysis:    Component Value Date/Time   COLORURINE YELLOW (A) 01/07/2020 1535   APPEARANCEUR CLOUDY (A) 01/07/2020 1535   APPEARANCEUR Hazy (A) 12/25/2018 1442   LABSPEC 1.009 01/07/2020 1535   PHURINE 7.0 01/07/2020 1535   GLUCOSEU NEGATIVE 01/07/2020  1535   HGBUR MODERATE (A) 01/07/2020 1535   BILIRUBINUR NEGATIVE 01/07/2020 1535   BILIRUBINUR Negative 12/25/2018 1442   KETONESUR NEGATIVE 01/07/2020 1535   PROTEINUR NEGATIVE 01/07/2020 1535   NITRITE NEGATIVE 01/07/2020 1535   LEUKOCYTESUR TRACE (A) 01/07/2020 1535   Sepsis Labs: @LABRCNTIP (procalcitonin:4,lacticidven:4)  ) Recent Results (from the past 240 hour(s))  Resp Panel by RT-PCR (Flu A&B, Covid) Nasopharyngeal Swab     Status: None   Collection Time: 01/27/20  3:19 AM   Specimen: Nasopharyngeal Swab; Nasopharyngeal(NP) swabs in vial transport medium  Result Value Ref Range Status   SARS Coronavirus 2 by RT PCR NEGATIVE NEGATIVE Final    Comment: (NOTE) SARS-CoV-2 target nucleic acids are NOT DETECTED.  The SARS-CoV-2 RNA is generally detectable in upper respiratory specimens during the acute phase of infection. The lowest concentration of SARS-CoV-2 viral copies this assay can detect is 138 copies/mL. A negative result does not preclude SARS-Cov-2 infection and should not be used as the sole basis for treatment or other patient management decisions. A negative result may occur with  improper specimen collection/handling, submission of specimen other than nasopharyngeal swab, presence of viral mutation(s) within the areas targeted by this assay, and inadequate number of viral copies(<138 copies/mL). A negative result must be combined with clinical observations, patient history, and epidemiological information. The expected result is Negative.  Fact Sheet for Patients:  14/12/21  Fact Sheet for Healthcare Providers:  BloggerCourse.com  This test is no t yet approved or cleared by the [place] FDA and  has been authorized for detection and/or diagnosis of SARS-CoV-2 by FDA under an Emergency Use Authorization (EUA). This EUA will remain  in effect (meaning this test can be used) for the duration of  the COVID-19 declaration under Section 564(b)(1) of the Act, 21 U.S.C.section 360bbb-3(b)(1), unless the authorization is terminated  or revoked sooner.       Influenza A by PCR NEGATIVE NEGATIVE Final   Influenza B by PCR NEGATIVE NEGATIVE Final    Comment: (NOTE) The Xpert Xpress SARS-CoV-2/FLU/RSV plus assay is intended as an aid in the diagnosis of influenza from Nasopharyngeal swab specimens and should not be used as a sole basis  for treatment. Nasal washings and aspirates are unacceptable for Xpert Xpress SARS-CoV-2/FLU/RSV testing.  Fact Sheet for Patients: BloggerCourse.com  Fact Sheet for Healthcare Providers: SeriousBroker.it  This test is not yet approved or cleared by the [place] FDA and has been authorized for detection and/or diagnosis of SARS-CoV-2 by FDA under an Emergency Use Authorization (EUA). This EUA will remain in effect (meaning this test can be used) for the duration of the COVID-19 declaration under Section 564(b)(1) of the Act, 21 U.S.C. section 360bbb-3(b)(1), unless the authorization is terminated or revoked.  Performed at [facility], [address] [place], [place] [address]          Radiology Studies: CT ANGIO CHEST PE W OR WO CONTRAST  Result Date: 01/27/2020 CLINICAL DATA:  PE suspected, abrupt onset dyspnea and AFib EXAM: CT ANGIOGRAPHY CHEST WITH CONTRAST TECHNIQUE: Multidetector CT imaging of the chest was performed using the standard protocol during bolus administration of intravenous contrast. Multiplanar CT image reconstructions and MIPs were obtained to evaluate the vascular anatomy. CONTRAST:  OMNIPAQUE IOHEXOL 350 MG/ML SOLN COMPARISON:  12/22/2019 FINDINGS: Cardiovascular: Examination for pulmonary embolism is somewhat limited by motion artifact. Within this limitation, no evidence of pulmonary embolism through the segmental pulmonary arterial level.  Cardiomegaly. Left coronary artery calcifications. No pericardial effusion. Mediastinum/Nodes: No enlarged mediastinal, hilar, or axillary lymph nodes. Thyroid gland, trachea, and esophagus demonstrate no significant findings. Lungs/Pleura: Lungs are clear. No pleural effusion or pneumothorax. Upper Abdomen: No acute abnormality. Musculoskeletal: No chest wall abnormality. No acute or significant osseous findings. Review of the MIP images confirms the above findings. IMPRESSION: 1. Examination for pulmonary embolism is somewhat limited by motion artifact. Within this limitation, no evidence of pulmonary embolism through the segmental pulmonary arterial level. 2. Cardiomegaly and coronary artery disease. Electronically Signed   By: Lauralyn Primes M.D.   On: 01/27/2020 11:15   DG Chest Port 1 View  Result Date: 01/27/2020 CLINICAL DATA:  Initial evaluation for acute shortness of breath. EXAM: PORTABLE CHEST 1 VIEW COMPARISON:  Prior radiograph from 01/07/2020. FINDINGS: Cardiomegaly, stable.  Mediastinal silhouette within normal limits. Lungs mildly hypoinflated. Diffuse pulmonary vascular and interstitial congestion, suggesting mild pulmonary interstitial congestion/edema. No visible pleural effusion. No focal infiltrates. No pneumothorax. No acute osseous finding. IMPRESSION: Cardiomegaly with mild diffuse pulmonary interstitial congestion/edema. Electronically Signed   By: Rise Mu M.D.   On: 01/27/2020 04:03        Scheduled Meds:  amiodarone  200 mg Oral BID   aspirin  81 mg Oral Daily   atorvastatin  40 mg Oral Daily   DULoxetine  20 mg Oral Daily   enoxaparin (LOVENOX) injection  1 mg/kg Subcutaneous Q12H   ferrous sulfate  325 mg Oral BID   fluticasone  2 spray Each Nare BID   folic acid  1 mg Oral Daily   furosemide  40 mg Intravenous Q12H   insulin aspart  0-15 Units Subcutaneous TID WC   insulin aspart  0-5 Units Subcutaneous QHS   levothyroxine  75 mcg Oral  Q0600   LORazepam  0-4 mg Intravenous Q6H   LORazepam  0-4 mg Intravenous Q6H   Followed by   Melene Muller ON 01/29/2020] LORazepam  0-4 mg Intravenous Q12H   LORazepam  1 mg Intravenous Once   magnesium oxide  400 mg Oral BID   metoprolol succinate  100 mg Oral Daily   montelukast  10 mg Oral QHS   multivitamin with minerals  1 tablet Oral Daily  potassium chloride  40 mEq Oral Daily   sodium chloride flush  3 mL Intravenous Q12H   thiamine  100 mg Oral Daily   Or   thiamine  100 mg Intravenous Daily   traZODone  150 mg Oral QHS   warfarin  10 mg Oral ONCE-1600   Warfarin - Pharmacist Dosing Inpatient   Does not apply q1600   Continuous Infusions:  sodium chloride       LOS: 1 day    Time spent: 50 min    Silvano Bilis, MD [facility]   If 7PM-7AM, please contact night-coverage [email or web address] Password [facility] 01/28/2020, 10:17 AM

## 2020-01-28 NOTE — ED Notes (Signed)
Pt sleeping soundly, even and unlabored respirations on CPAP, light dimmed 

## 2020-01-28 NOTE — ED Notes (Signed)
Pt to chair, given Vacutainer to urinate in, pt bed linen changed with clean chux

## 2020-01-28 NOTE — ED Notes (Signed)
Pt in hospital bed sleeping with CPAP mask on and cardiac monitoring cords on floor. Pt advised to leave cords on person while admitted to facility. Pt agreeable with leaving monitoring cords on once up and awake for breakfast. Pt returned to sleep.

## 2020-01-28 NOTE — Progress Notes (Signed)
[facility]  SUBJECTIVE: Patient laying flat in bed, reports intermittent mild chest pain, shortness of breath   Vitals:   01/27/20 2100 01/27/20 2110 01/27/20 2300 01/28/20 0411  BP: (!) 148/115 (!) 148/115  (!) 159/111  Pulse: 96 96  95  Resp:  (!) 24 (!) 8 16  Temp:  98.2 F (36.8 C)    TempSrc:  Oral    SpO2:  99%  99%  Weight:      Height:         Intake/Output Summary (Last 24 hours) at 01/28/2020 2440 Last data filed at 01/28/2020 0600 Gross per 24 hour  Intake --  Output 1375 ml  Net -1375 ml      PHYSICAL EXAM  General: Well developed, well nourished, in no acute distress HEENT:  Normocephalic and atramatic Neck:  No JVD.  Lungs: Clear bilaterally to auscultation and percussion. Heart: HRRR . Normal S1 and S2 without gallops or murmurs.  Abdomen: Bowel sounds are positive, abdomen soft and non-tender  Msk:  Back normal, normal gait. Normal strength and tone for age. Extremities: No clubbing, cyanosis or edema.   Neuro: Alert and oriented X 3. Psych:  Good affect, responds appropriately   LABS: Basic Metabolic Panel: Recent Labs    01/27/20 0319 01/27/20 0454 01/28/20 0442  NA 136  --  135  K 3.7  --  3.6  CL 94*  --  90*  CO2 25  --  34*  GLUCOSE 172*  --  123*  BUN 6  --  8  CREATININE 0.94  --  0.95  CALCIUM 7.8*  --  7.7*  MG  --  1.5*  --   PHOS  --  2.3*  --    Liver Function Tests: Recent Labs    01/27/20 0319  AST 57*  ALT 31  ALKPHOS 107  BILITOT 1.0  PROT 7.2  ALBUMIN 3.3*   No results for input(s): LIPASE, AMYLASE in the last 72 hours. CBC: Recent Labs    01/27/20 0319  WBC 8.4  NEUTROABS 5.9  HGB 12.0*  HCT 37.8*  MCV 92.4  PLT 296   Cardiac Enzymes: No results for input(s): CKTOTAL, CKMB, CKMBINDEX, TROPONINI in the last 72 hours. BNP: Invalid input(s): POCBNP D-Dimer: No results for input(s): DDIMER in the last 72 hours. Hemoglobin A1C: Recent Labs    01/27/20 0454  HGBA1C 6.7*   Fasting Lipid  Panel: No results for input(s): CHOL, HDL, LDLCALC, TRIG, CHOLHDL, LDLDIRECT in the last 72 hours. Thyroid Function Tests: Recent Labs    01/27/20 0956  TSH 12.805*   Anemia Panel: No results for input(s): VITAMINB12, FOLATE, FERRITIN, TIBC, IRON, RETICCTPCT in the last 72 hours.  CT ANGIO CHEST PE W OR WO CONTRAST  Result Date: 01/27/2020 CLINICAL DATA:  PE suspected, abrupt onset dyspnea and AFib EXAM: CT ANGIOGRAPHY CHEST WITH CONTRAST TECHNIQUE: Multidetector CT imaging of the chest was performed using the standard protocol during bolus administration of intravenous contrast. Multiplanar CT image reconstructions and MIPs were obtained to evaluate the vascular anatomy. CONTRAST:  OMNIPAQUE IOHEXOL 350 MG/ML SOLN COMPARISON:  12/22/2019 FINDINGS: Cardiovascular: Examination for pulmonary embolism is somewhat limited by motion artifact. Within this limitation, no evidence of pulmonary embolism through the segmental pulmonary arterial level. Cardiomegaly. Left coronary artery calcifications. No pericardial effusion. Mediastinum/Nodes: No enlarged mediastinal, hilar, or axillary lymph nodes. Thyroid gland, trachea, and esophagus demonstrate no significant findings. Lungs/Pleura: Lungs are clear. No pleural effusion or pneumothorax. Upper Abdomen: No  acute abnormality. Musculoskeletal: No chest wall abnormality. No acute or significant osseous findings. Review of the MIP images confirms the above findings. IMPRESSION: 1. Examination for pulmonary embolism is somewhat limited by motion artifact. Within this limitation, no evidence of pulmonary embolism through the segmental pulmonary arterial level. 2. Cardiomegaly and coronary artery disease. Electronically Signed   By: Lauralyn Primes M.D.   On: 01/27/2020 11:15   DG Chest Port 1 View  Result Date: 01/27/2020 CLINICAL DATA:  Initial evaluation for acute shortness of breath. EXAM: PORTABLE CHEST 1 VIEW COMPARISON:  Prior radiograph from  01/07/2020. FINDINGS: Cardiomegaly, stable.  Mediastinal silhouette within normal limits. Lungs mildly hypoinflated. Diffuse pulmonary vascular and interstitial congestion, suggesting mild pulmonary interstitial congestion/edema. No visible pleural effusion. No focal infiltrates. No pneumothorax. No acute osseous finding. IMPRESSION: Cardiomegaly with mild diffuse pulmonary interstitial congestion/edema. Electronically Signed   By: Rise Mu M.D.   On: 01/27/2020 04:03     Echo LVEF 55 to 60% on 12/24/2019  TELEMETRY: Atrial fibrillation 91 bpm:  ASSESSMENT AND PLAN:  Principal Problem:   Acute on chronic respiratory failure with hypoxia (HCC) Active Problems:   Acute on chronic diastolic CHF (congestive heart failure) (HCC)   HTN (hypertension)   Morbid obesity with BMI of 60.0-69.9, adult (HCC)   History of pulmonary embolism (on Coumadin)   Atrial fibrillation with RVR (HCC)   CAD (coronary artery disease)   Long term (current) use of anticoagulants   Alcohol withdrawal syndrome without complication (HCC)   CHF (congestive heart failure) (HCC)    1. Acute on chronic respiratory failure with hypoxia, multifactorial, secondary to COPD, obstructive sleep apnea, acute on chronic diastolic congestive heart failure, exacerbated by medical and dietary noncompliance, obesity, and daily alcohol abuse. Patient has a history of prior pulmonary embolus, subtherapeutic on warfarin.   Chest CT does not reveal pulmonary embolus. 2.  Acute on chronic diastolic congestive heart failure, likely noncompliant with diet, BNP 279.8, on furosemide 40 mg IV every 12 3.  Atrial fibrillation with rapid ventricular rate, subtherapeutic on warfarin for stroke prevention 4.  Chest pain, atypical features, borderline elevated high-sensitivity troponin (62, 68, 91) without delta, with nondiagnostic ECG 5.  Alcohol withdrawal syndrome, with tremulousness and tachycardia, in patient with daily alcohol  use 6.  History of pulmonary embolus, subtherapeutic on warfarin, no evidence of current PE on chest CT  Recommendations  1.  Agree with current plan 2.  Continue Lovenox 3.  Continue warfarin, adjust dose to target INR 2.0-3.0 4.  Agree with furosemide 40 mg IV twice daily 5.  Carefully monitor renal status 6.  Continue amiodarone 200 mg twice daily 7.  Continue metoprolol succinate 100 mg daily 8.  Defer cardiac catheterization or nuclear stress test at this time 9.  Further recommendations pending patient's clinical course   Marcina Millard, MD, PhD, [facility] 01/28/2020 8:11 AM

## 2020-01-28 NOTE — Progress Notes (Signed)
ANTICOAGULATION CONSULT NOTE  Pharmacy Consult for Warfarin with Lovenox Bridge Indication: Afib and PE  No Known Allergies  Patient Measurements: Height: 5\' 8"  (172.7 cm) Weight: (!) 186 kg (410 lb) IBW/kg (Calculated) : 68.4   Vital Signs: Temp: 98.2 F (36.8 C) (12/12 2110) Temp Source: Oral (12/12 2110) BP: 159/111 (12/13 0411) Pulse Rate: 95 (12/13 0411)  Labs: Recent Labs    01/27/20 0319 01/27/20 0454 01/27/20 0956 01/28/20 0442  HGB 12.0*  --   --   --   HCT 37.8*  --   --   --   PLT 296  --   --   --   LABPROT 14.9  --   --  14.9  INR 1.2  --   --  1.2  CREATININE 0.94  --   --  0.95  TROPONINIHS 62* 68* 91*  --     Estimated Creatinine Clearance: 136.7 mL/min (by C-G formula based on SCr of 0.95 mg/dL).   Medical History: Past Medical History:  Diagnosis Date  . Allergy   . Anxiety   . Arthritis   . Asthma   . Brain damage   . Chronic pain of both knees 07/13/2018  . Clotting disorder (HCC)   . COPD (chronic obstructive pulmonary disease) (HCC)   . Depression   . GERD (gastroesophageal reflux disease)   . HFrEF (heart failure with reduced ejection fraction) (HCC)    a. 03/2018 Echo: EF 25-30%, diff HK. Mod LAE.  04/2018 History of DVT (deep vein thrombosis)   . History of pulmonary embolism    a. Chronic coumadin.  [name] Hypertension   . MI (myocardial infarction) (HCC)   . Morbid obesity (HCC)   . Neck pain 07/13/2018  . NICM (nonischemic cardiomyopathy) (HCC)    a. s/p Cath x 3 - reportedly nl cors. Last cath 2019 in [place]; b. a. 03/2018 Echo: EF 25-30%, diff HK.  04/2018 Persistent atrial fibrillation (HCC)    a. 03/2018 s/p DCCV; b. CHA2DS2VASc = 1-->Xarelto (later changed to warfarin); c. 05/2018 recurrent afib-->Amio initiated.  . Sleep apnea   . Sleep apnea     Medications:  PTA - Coumadin 5mg  daily, pt reports last dose pta 5mg  12/11  Assessment:    INR Warfarin dose   5mg  (last dose pta per report) 12/12  1.2 7.5mg  12/12 1.2 10mg   Goal of  Therapy:   INR Goal 2-3  Plan:  Continue Lovenox 1 mg/kg Q12hr.    Ordered Warfarin 10mg  at 1600 today.    Will continue Lovenox, order INR, and adjust warfarin dose, if warranted, daily until INR is therapeutic x 2 days.    Will continue to monitor SCr, H&H, and Platelets.    , PharmD, BCPS Clinical Pharmacist 01/28/2020 8:16 AM

## 2020-01-29 LAB — BASIC METABOLIC PANEL
Anion gap: 12 (ref 5–15)
BUN: 8 mg/dL (ref 6–20)
CO2: 32 mmol/L (ref 22–32)
Calcium: 7.6 mg/dL — ABNORMAL LOW (ref 8.9–10.3)
Chloride: 91 mmol/L — ABNORMAL LOW (ref 98–111)
Creatinine, Ser: 0.93 mg/dL (ref 0.61–1.24)
GFR, Estimated: >60 mL/min (ref >60)
Glucose, Bld: 121 mg/dL — ABNORMAL HIGH (ref 70–99)
Potassium: 3.6 mmol/L (ref 3.5–5.1)
Sodium: 135 mmol/L (ref 135–145)

## 2020-01-29 LAB — GLUCOSE, CAPILLARY
Glucose-Capillary: 125 mg/dL — ABNORMAL HIGH (ref 70–99)
Glucose-Capillary: 133 mg/dL — ABNORMAL HIGH (ref 70–99)
Glucose-Capillary: 123 mg/dL — ABNORMAL HIGH (ref 70–99)
Glucose-Capillary: 124 mg/dL — ABNORMAL HIGH (ref 70–99)

## 2020-01-29 LAB — PROTIME-INR
INR: 1.4 — ABNORMAL HIGH (ref 0.8–1.2)
Prothrombin Time: 17 seconds — ABNORMAL HIGH (ref 11.4–15.2)

## 2020-01-29 LAB — CBC
HCT: 34.1 % — ABNORMAL LOW (ref 39.0–52.0)
Hemoglobin: 11 g/dL — ABNORMAL LOW (ref 13.0–17.0)
MCH: 30.3 pg (ref 26.0–34.0)
MCHC: 32.3 g/dL (ref 30.0–36.0)
MCV: 93.9 fL (ref 80.0–100.0)
Platelets: 160 10*3/uL (ref 150–400)
RBC: 3.63 MIL/uL — ABNORMAL LOW (ref 4.22–5.81)
RDW: 16.8 % — ABNORMAL HIGH (ref 11.5–15.5)
WBC: 5.1 10*3/uL (ref 4.0–10.5)
nRBC: 0.4 % — ABNORMAL HIGH (ref 0.0–0.2)

## 2020-01-29 MED ORDER — VANCOMYCIN HCL 2000 MG/400ML IV SOLN
2000.0000 mg | INTRAVENOUS | Status: DC
  Filled 2020-01-29: qty 400

## 2020-01-29 MED ORDER — VANCOMYCIN HCL 2000 MG/400ML IV SOLN
2000.0000 mg | Freq: Once | INTRAVENOUS | Status: AC
  Administered 2020-01-29: 15:00:00 2000 mg via INTRAVENOUS
  Filled 2020-01-29: qty 400

## 2020-01-29 MED ORDER — VANCOMYCIN HCL 2000 MG/400ML IV SOLN
2000.0000 mg | INTRAVENOUS | Status: DC
  Administered 2020-01-30 – 2020-01-31 (×2): 2000 mg via INTRAVENOUS
  Filled 2020-01-29 (×3): qty 400

## 2020-01-29 MED ORDER — VANCOMYCIN HCL IN DEXTROSE 1-5 GM/200ML-% IV SOLN: 1000.0000 mg | Freq: Once | INTRAVENOUS | Status: DC

## 2020-01-29 MED ORDER — WARFARIN SODIUM 6 MG PO TABS
12.0000 mg | ORAL_TABLET | Freq: Once | ORAL | Status: AC
  Administered 2020-01-29: 17:00:00 12 mg via ORAL
  Filled 2020-01-29: qty 2

## 2020-01-29 MED ORDER — VANCOMYCIN HCL 500 MG/100ML IV SOLN
500.0000 mg | Freq: Once | INTRAVENOUS | Status: AC
  Administered 2020-01-29: 17:00:00 500 mg via INTRAVENOUS
  Filled 2020-01-29: qty 100

## 2020-01-29 NOTE — Progress Notes (Signed)
[facility] Cardiology    SUBJECTIVE: The patient states that his breathing has improved slightly, but he still has difficulty taking a deep breath. He has chronic left chest pressure that is not worsened or changed in character.   Vitals:   01/28/20 2154 01/28/20 2238 01/29/20 0432 01/29/20 0727  BP: 129/68 114/81 (!) 91/56 110/71  Pulse: 92 85 72 92  Resp: (!) 28 18 20 20   Temp:  98.6 F (37 C) 98.2 F (36.8 C) 98.1 F (36.7 C)  TempSrc:  Oral Oral Oral  SpO2: 95% 94% 95% 91%  Weight:   (!) 191.1 kg   Height:         Intake/Output Summary (Last 24 hours) at 01/29/2020 0837 Last data filed at 01/29/2020 0730 Gross per 24 hour  Intake 90 ml  Output 850 ml  Net -760 ml      PHYSICAL EXAM  General: severely obese gentleman sitting up in recliner watching TV in no acute distress HEENT:  Normocephalic and atramatic Neck:  No JVD.  Lungs: Clear bilaterally to auscultation, slight increased breathing with prolonged talking Heart: HRRR . Normal S1 and S2 without gallops or murmurs.  Msk:  Back normal, gait not assessed. Normal strength and tone for age. Extremities: lower extremity edema and chronic venous stasis skin changes   Neuro: Alert and oriented X 3. Psych:  Good affect, responds appropriately   LABS: Basic Metabolic Panel: Recent Labs    01/27/20 0454 01/28/20 0442 01/29/20 0442  NA  --  135 135  K  --  3.6 3.6  CL  --  90* 91*  CO2  --  34* 32  GLUCOSE  --  123* 121*  BUN  --  8 8  CREATININE  --  0.95 0.93  CALCIUM  --  7.7* 7.6*  MG 1.5*  --   --   PHOS 2.3*  --   --    Liver Function Tests: Recent Labs    01/27/20 0319  AST 57*  ALT 31  ALKPHOS 107  BILITOT 1.0  PROT 7.2  ALBUMIN 3.3*   No results for input(s): LIPASE, AMYLASE in the last 72 hours. CBC: Recent Labs    01/27/20 0319 01/29/20 0442  WBC 8.4 5.1  NEUTROABS 5.9  --   HGB 12.0* 11.0*  HCT 37.8* 34.1*  MCV 92.4 93.9  PLT 296 160   Cardiac Enzymes: No results for input(s):  CKTOTAL, CKMB, CKMBINDEX, TROPONINI in the last 72 hours. BNP: Invalid input(s): POCBNP D-Dimer: No results for input(s): DDIMER in the last 72 hours. Hemoglobin A1C: Recent Labs    01/27/20 0454  HGBA1C 6.7*   Fasting Lipid Panel: No results for input(s): CHOL, HDL, LDLCALC, TRIG, CHOLHDL, LDLDIRECT in the last 72 hours. Thyroid Function Tests: Recent Labs    01/27/20 0956  TSH 12.805*   Anemia Panel: No results for input(s): VITAMINB12, FOLATE, FERRITIN, TIBC, IRON, RETICCTPCT in the last 72 hours.  CT ANGIO CHEST PE W OR WO CONTRAST  Result Date: 01/27/2020 CLINICAL DATA:  PE suspected, abrupt onset dyspnea and AFib EXAM: CT ANGIOGRAPHY CHEST WITH CONTRAST TECHNIQUE: Multidetector CT imaging of the chest was performed using the standard protocol during bolus administration of intravenous contrast. Multiplanar CT image reconstructions and MIPs were obtained to evaluate the vascular anatomy. CONTRAST:  14/01/2020 OMNIPAQUE IOHEXOL 350 MG/ML SOLN COMPARISON:  12/22/2019 FINDINGS: Cardiovascular: Examination for pulmonary embolism is somewhat limited by motion artifact. Within this limitation, no evidence of pulmonary embolism through the segmental  pulmonary arterial level. Cardiomegaly. Left coronary artery calcifications. No pericardial effusion. Mediastinum/Nodes: No enlarged mediastinal, hilar, or axillary lymph nodes. Thyroid gland, trachea, and esophagus demonstrate no significant findings. Lungs/Pleura: Lungs are clear. No pleural effusion or pneumothorax. Upper Abdomen: No acute abnormality. Musculoskeletal: No chest wall abnormality. No acute or significant osseous findings. Review of the MIP images confirms the above findings. IMPRESSION: 1. Examination for pulmonary embolism is somewhat limited by motion artifact. Within this limitation, no evidence of pulmonary embolism through the segmental pulmonary arterial level. 2. Cardiomegaly and coronary artery disease. Electronically Signed    By: Lauralyn Primes M.D.   On: 01/27/2020 11:15     Echo LVEF 55 to 60% on 12/24/2019  TELEMETRY: sinus rhythm, 86 bpm  ASSESSMENT AND PLAN:  Principal Problem:   Acute on chronic respiratory failure with hypoxia (HCC) Active Problems:   Acute on chronic diastolic CHF (congestive heart failure) (HCC)   HTN (hypertension)   Morbid obesity with BMI of 60.0-69.9, adult (HCC)   History of pulmonary embolism (on Coumadin)   Atrial fibrillation with RVR (HCC)   CAD (coronary artery disease)   Long term (current) use of anticoagulants   Alcohol withdrawal syndrome without complication (HCC)   CHF (congestive heart failure) (HCC)    1. Acute on chronic respiratory failure with hypoxia, multifactorial, secondary to COPD, obstructive sleep apnea, acute on chronic diastolic congestive heart failure, exacerbated by medical and dietary noncompliance, obesity, and daily alcohol abuse. Patient has a history of prior pulmonary embolus, subtherapeutic on warfarin. ChestCT does not reveal pulmonary embolus. 2.Acute on chronic diastolic congestive heart failure, likely noncompliant with diet, BNP 279.8, on furosemide 40 mg IV every 12 3.Atrial fibrillation with rapid ventricular rate, subtherapeutic on warfarin for stroke prevention 4.Chest pain, atypical features, borderline elevated high-sensitivity troponin (62, 68, 91, 37)without delta, with nondiagnostic ECG 5.Alcohol withdrawal syndrome, with tremulousness and tachycardia,inpatient with daily alcohol use 6. History of pulmonary embolus,subtherapeutic on warfarin, no evidence of current PE on chest CT  Recommendations: 1. Continue amiodarone 200 mg BID and metoprolol succinate 100 mg for rate and rhythm control 2. Continue warfarin with goal INR 2.0-3.0 per pharmacy 3. Continue IV Lasix 60 mg BID with careful monitoring of renal status and electrolytes    Leanora Ivanoff, PA-C 01/29/2020 8:37 AM

## 2020-01-29 NOTE — Consult Note (Addendum)
   Heart Failure Nurse Navigator Note  HfpEF 55-60%.  He presented to the emergency room complaints of onset of shortness of breath that woke him up from sleep.  He also complained of chest tightness, nausea.  Co morbidities:  Morbid obesity Chronic atrial fibrillation Coronary artery disease Hypertension Type 2 diabetes Obstructive sleep apnea with CPAP use   Alcohol abuse  Medications:  Amiodarone 200 mg twice a day Aspirin 81 mg daily Atorvastatin 40 mg daily Lasix 60 mg IV every 12 hours Hydralazine 25 mg every 6 hours as needed Metoprolol succinate 100 mg daily Potassium chloride 40 mEq daily  Labs:  Sodium 135, potassium 3.6, chloride 91, CO2 32, BUN 8, creatinine 0.93, INR 1.4, white blood count 5.1, hemoglobin 11, hematocrit 34.1, platelets 160.  BNP on admission 279. Weight 191.1 kg BMI 64.07 Blood pressure 110/70 Intake 90 mL Output 250 mL  Assessment:  General- he is awake and alert sitting up in the chair at bedside.  HEENT pupils are equal and reactive to light unable to evaluate for JVD due to body habitus.  Cardiac-heart tones are regular no rubs or murmurs appreciated.   Chest -Breath sounds are diminished posteriorly.  Abdomen-obese, rounded.  Musculoskeletal-lower extremities and discoloration significant for venous stasis and edematous.  Psych he is pleasant and appropriate, makes eye contact  Neurologic-speech is clear moves all extremities without difficulty.  Initial visit with on this admission.  He has previously been admitted 9/22/ 21, 12/22/19 and 01/07/2020.  Asked him in what ways could we help to keep him out of the hospital he states he really did not have an idea other than that he feels that he needs to be able to administer breathing treatments at home to himself.  I told him that he would have to discuss that with the internal medicine doctor.  Discussed his diet and he states that he really does not eat much and then  questioned about his fluid intake during the day and he said he does not even take in more than 2 L of ginger ale daily.  States that he only binge drinks a pint of whiskey 2 times a month because that is all he can afford, due to check is only  for $780 monthly.  Also discussed weighing daily and he states that he is unable due to being unsteady on his feet and his knees collapsing making it unable for him to stand on a scale.  States he has trouble with remembering to do it, asked if we made him signs to hang up in his home to remember to weigh daily and record if that would help.  Contacted Clarisa Kindred NP in the outpatient heart failure clinic for input as to way we could monitor his weights.  Continue to follow.  Tresa Endo RN, CHFN

## 2020-01-29 NOTE — Progress Notes (Signed)
Checked on patient to see if he was ready to go on CPAP for the night, but patient was already on CPAP.

## 2020-01-29 NOTE — Progress Notes (Signed)
ANTICOAGULATION CONSULT NOTE  Pharmacy Consult for Warfarin with Lovenox Bridge Indication: Afib and PE  No Known Allergies  Patient Measurements: Height: 5\' 8"  (172.7 cm) Weight: (!) 191.1 kg (421 lb 6.4 oz) IBW/kg (Calculated) : 68.4  Vital Signs: Temp: 98.6 F (37 C) (12/14 1148) Temp Source: Oral (12/14 1148) BP: 102/49 (12/14 1148) Pulse Rate: 73 (12/14 1148)  Labs: Recent Labs    01/27/20 0319 01/27/20 0454 01/27/20 0956 01/28/20 0442 01/28/20 1201 01/29/20 0442  HGB 12.0*  --   --   --   --  11.0*  HCT 37.8*  --   --   --   --  34.1*  PLT 296  --   --   --   --  160  LABPROT 14.9  --   --  14.9  --  17.0*  INR 1.2  --   --  1.2  --  1.4*  CREATININE 0.94  --   --  0.95  --  0.93  TROPONINIHS 62* 68* 91*  --  37*  --     Estimated Creatinine Clearance: 142.1 mL/min (by C-G formula based on SCr of 0.93 mg/dL).  Medical History: Past Medical History:  Diagnosis Date  . Allergy   . Anxiety   . Arthritis   . Asthma   . Brain damage   . Chronic pain of both knees 07/13/2018  . Clotting disorder (HCC)   . COPD (chronic obstructive pulmonary disease) (HCC)   . Depression   . GERD (gastroesophageal reflux disease)   . HFrEF (heart failure with reduced ejection fraction) (HCC)    a. 03/2018 Echo: EF 25-30%, diff HK. Mod LAE.  04/2018 History of DVT (deep vein thrombosis)   . History of pulmonary embolism    a. Chronic coumadin.  [name] Hypertension   . MI (myocardial infarction) (HCC)   . Morbid obesity (HCC)   . Neck pain 07/13/2018  . NICM (nonischemic cardiomyopathy) (HCC)    a. s/p Cath x 3 - reportedly nl cors. Last cath 2019 in [place]; b. a. 03/2018 Echo: EF 25-30%, diff HK.  04/2018 Persistent atrial fibrillation (HCC)    a. 03/2018 s/p DCCV; b. CHA2DS2VASc = 1-->Xarelto (later changed to warfarin); c. 05/2018 recurrent afib-->Amio initiated.  . Sleep apnea   . Sleep apnea    Medications:  PTA - Coumadin 5mg  daily, pt reports last dose pta 5mg  12/11 PTA - Amiodarone 200  mg twice daily - DDI may increase INR  Ceftriaxone 2 g q24h -  DDI may increase INR   Assessment: 59 year old male presenting with shortness of breath. PMH includes deafness in left ear, morbid obesity, COPD, grade 1 diastolic dysfunction, HTN, hypothyroidism, anxiety/depression, obstructive sleep apnea endorses compliance on CPAP, hyperlipidemia, alcohol abuse, chronic atrial fibrillation and pulmonary emboli on warfarin. Reported warfarin dose prior to admission 5mg  daily. Pt currently on lovenox and warfarin for bridging therapy.   Hgb 11 (stable), plt 282>146>99>296>160   Date INR Warfarin dose   5mg  (last dose pta per report) 12/12  1.2 7.5mg  12/13 1.2 10mg  12/14 1.4 12mg   Goal of Therapy:  INR Goal 2-3  Plan:  - Continue Lovenox 1 mg/kg Q12hr.  - Ordered Warfarin 12mg  at 1600 today.   - Will continue Lovenox, order INR, and adjust warfarin dose, if warranted, daily until INR is therapeutic x 2 days.   - Will continue to monitor SCr, H&H, and Platelets.    46, PharmD Pharmacy Resident  01/29/2020  12:55 PM

## 2020-01-29 NOTE — Progress Notes (Addendum)
PROGRESS NOTE    [name] [name]  GDJ:[id number] DOB: [birth year] DOA: 01/27/2020 PCP: [name], DO  Outpatient Specialists: [facility]    Brief Narrative:   [name] [name] is a 59 y.o. male with medical history significant for diastolic CHF, COPD on home O2 at 3 L, OSA on CPAP, morbid obesity, severe alcohol use disorder, chronic A. fib, history of PE on Coumadin, who presents to the emergency room with sudden onset shortness of breath that woke him up from sleep about 2 hours prior to arrival in the ER.  He has associated retrosternal chest tightness of moderate intensity, nonradiating, with no aggravating or alleviating factors.  Has Associated nausea but without vomiting or diaphoresis.  Denies cough, fever or chills.  Denies abdominal pain vomiting and diarrhea While in the emergency room patient was noted to have a mild tremor on CIWA protocol was initiated.  He was treated with IV Lasix for CHF exacerbation.  Hospitalist consulted for admission.   Assessment & Plan:   Principal Problem:   Acute on chronic respiratory failure with hypoxia (HCC) Active Problems:   Acute on chronic diastolic CHF (congestive heart failure) (HCC)   HTN (hypertension)   Morbid obesity with BMI of 60.0-69.9, adult (HCC)   History of pulmonary embolism (on Coumadin)   Atrial fibrillation with RVR (HCC)   CAD (coronary artery disease)   Long term (current) use of anticoagulants   Alcohol withdrawal syndrome without complication (HCC)   CHF (congestive heart failure) (HCC)  # Acute on chronic respiratory failure with hypoxia (HCC) Patient presenting with acute dyspnea, increased work of breathing, speaking in short sentences with increased O2 requirement up to 6 L to maintain sats in the low to mid 90s, now down to 4 L (home is 3). Respiratory status has improved but is not back to baseline, still feeling short of breath. Likely driven by a fib w/ rvr and resulting diastolic chf  exacerbation, though severe morbid obesity and OSA clearly contribute. Covid and flu negative and no infiltrate on chest x-ray. Does not appear to be a COPD exacerbation. CTA neg for PE - Treat potential etiologies below - wean o2 as able   # Atrial fibrillation w/ RVR # Acute on chronic diastolic CHF (congestive heart failure) (HCC) Here in a fib with rvr on presentation, now regular rate. uop not accurately recorded yesterday, pt says he put out at least 2 L. Kidney function stable. - cont lasix 60 IV bid for home 40 po bid - continue home metoprolol and amiodarone - cardiology consulted, appreciate recs - warfarin per pharmacy, admitting provider started lovenox which I will continue at 1 mg/kg bid.  # Alcohol abuse Patient says last drink was 12/10, prior last drink was 3 weeks prior to that. Has intention tremor but denies anxiety, tachycardia is improved, do not think is withdrawing and patient denies history of withdrawal. Has not required lorazepam past 2 days - d/c ciwa - cont thiamine   # HTN (hypertension) bp now soft in setting if diuresis - cont home metoprolol - continue lasix, dose increased - oral hydral prn   # Morbid obesity with BMI of 60.0-69.9, adult (HCC) - Complicating factor to overall prognosis and care   # History of pulmonary embolism (on Coumadin) INR subtherapeutic. Binge drinking contributing. Patient says failed NOAC had break-through PE leading to decision to start coumadin - Pharmacy consult for Coumadin - tx dose Lovenox for now   # Chest pain # Demand ischemia Suspect 2/2  rvr. Mild trop elevation mild up-trend to 90 , likely demand, ekg w/o obvious ischemic changes. . - tele - monitor, repeat trop today to confirm plateau - cont aspirin (81 for home 325), atorva  # Chronic pain - cont home cymbalta, hydromorphone prn  # Chronic hypokalemia - cont home kcl 40 qd  # OSA on CPAP - CPAP nightly  # Hypothyroid Home synthroid recently  increased last month. tsh elevated here - cont home synthroid, will need repeat tsh in a few weeks  # T2DM a1c 6.2 10/2019. Here glucose wnl - SSI moderate  # Insomnia - cont home trazodone   # Bilateral lower extremity venous stasis dermatitis.  Patient says warmth and redness have worsened. Likely dermatitis but prudent to cover for poss cellulitis. mrsa grew at wound visit in November. Pt reports no change  - s/p ceftriaxone 12/12-12/14, will start vanc today as review of prior cultures shows mrsa growing in November, if responds likely wean to clinda or bactrim  - Keep legs elevated, order for compression bandage and wound consult   DVT prophylaxis: lovenox therapeutic Code Status: full Family Communication: none @ bedside  Status is: Inpatient  Remains inpatient appropriate because:Inpatient level of care appropriate due to severity of illness   Dispo: The patient is from: Home              Anticipated d/c is to: Home              Anticipated d/c date is: 1-2 days              Patient currently is not medically stable to d/c.     Consultants:  cardiology  Procedures: none  Antimicrobials:  none    Subjective: This mornign says still sob but mildly improved, has mild cough. Continues to complain of warmth and redness in LEs. No fevers. Decreased appetite, no n/v/d.   Objective: Vitals:   01/28/20 2238 01/29/20 0432 01/29/20 0727 01/29/20 1148  BP: 114/81 (!) 91/56 110/71 (!) 102/49  Pulse: 85 72 92 73  Resp: 18 20 20 18   Temp: 98.6 F (37 C) 98.2 F (36.8 C) 98.1 F (36.7 C) 98.6 F (37 C)  TempSrc: Oral Oral Oral Oral  SpO2: 94% 95% 91% 93%  Weight:  (!) 191.1 kg    Height:        Intake/Output Summary (Last 24 hours) at 01/29/2020 1319 Last data filed at 01/29/2020 1022 Gross per 24 hour  Intake 330 ml  Output 1525 ml  Net -1195 ml   Filed Weights   01/27/20 0315 01/29/20 0432  Weight: (!) 186 kg (!) 191.1 kg    Examination:  General  exam: obese, chronically ill appearing Respiratory system: Clear to auscultation. Rales at bases Cardiovascular system: distant heart sounds, faint s1/s2 heard Gastrointestinal system: Abdomen is obese, soft and nontender. No organomegaly or masses felt. Normal bowel sounds heard. Central nervous system: Alert and oriented. No focal neurological deficits. Extremities: Symmetric 5 x 5 power. Skin: LE edema and erythema of distal extremities. erythema to upper calves, no purulence Psychiatry: Judgement and insight appear normal. Mood & affect appropriate.     Data Reviewed: I have personally reviewed following labs and imaging studies  CBC: Recent Labs  Lab 01/27/20 0319 01/29/20 0442  WBC 8.4 5.1  NEUTROABS 5.9  --   HGB 12.0* 11.0*  HCT 37.8* 34.1*  MCV 92.4 93.9  PLT 296 160   Basic Metabolic Panel: Recent Labs  Lab 01/27/20  0319 01/27/20 0454 01/28/20 0442 01/29/20 0442  NA 136  --  135 135  K 3.7  --  3.6 3.6  CL 94*  --  90* 91*  CO2 25  --  34* 32  GLUCOSE 172*  --  123* 121*  BUN 6  --  8 8  CREATININE 0.94  --  0.95 0.93  CALCIUM 7.8*  --  7.7* 7.6*  MG  --  1.5*  --   --   PHOS  --  2.3*  --   --    GFR: Estimated Creatinine Clearance: 142.1 mL/min (by C-G formula based on SCr of 0.93 mg/dL). Liver Function Tests: Recent Labs  Lab 01/27/20 0319  AST 57*  ALT 31  ALKPHOS 107  BILITOT 1.0  PROT 7.2  ALBUMIN 3.3*   No results for input(s): LIPASE, AMYLASE in the last 168 hours. No results for input(s): AMMONIA in the last 168 hours. Coagulation Profile: Recent Labs  Lab 01/27/20 0319 01/28/20 0442 01/29/20 0442  INR 1.2 1.2 1.4*   Cardiac Enzymes: No results for input(s): CKTOTAL, CKMB, CKMBINDEX, TROPONINI in the last 168 hours. BNP (last 3 results) No results for input(s): PROBNP in the last 8760 hours. HbA1C: Recent Labs    01/27/20 0454  HGBA1C 6.7*   CBG: Recent Labs  Lab 01/28/20 0747 01/28/20 1137 01/28/20 1716  01/29/20 0729 01/29/20 1147  GLUCAP 115* 130* 119* 125* 133*   Lipid Profile: No results for input(s): CHOL, HDL, LDLCALC, TRIG, CHOLHDL, LDLDIRECT in the last 72 hours. Thyroid Function Tests: Recent Labs    01/27/20 0956  TSH 12.805*   Anemia Panel: No results for input(s): VITAMINB12, FOLATE, FERRITIN, TIBC, IRON, RETICCTPCT in the last 72 hours. Urine analysis:    Component Value Date/Time   COLORURINE YELLOW (A) 01/07/2020 1535   APPEARANCEUR CLOUDY (A) 01/07/2020 1535   APPEARANCEUR Hazy (A) 12/25/2018 1442   LABSPEC 1.009 01/07/2020 1535   PHURINE 7.0 01/07/2020 1535   GLUCOSEU NEGATIVE 01/07/2020 1535   HGBUR MODERATE (A) 01/07/2020 1535   BILIRUBINUR NEGATIVE 01/07/2020 1535   BILIRUBINUR Negative 12/25/2018 1442   KETONESUR NEGATIVE 01/07/2020 1535   PROTEINUR NEGATIVE 01/07/2020 1535   NITRITE NEGATIVE 01/07/2020 1535   LEUKOCYTESUR TRACE (A) 01/07/2020 1535   Sepsis Labs: @LABRCNTIP (procalcitonin:4,lacticidven:4)  ) Recent Results (from the past 240 hour(s))  Resp Panel by RT-PCR (Flu A&B, Covid) Nasopharyngeal Swab     Status: None   Collection Time: 01/27/20  3:19 AM   Specimen: Nasopharyngeal Swab; Nasopharyngeal(NP) swabs in vial transport medium  Result Value Ref Range Status   SARS Coronavirus 2 by RT PCR NEGATIVE NEGATIVE Final    Comment: (NOTE) SARS-CoV-2 target nucleic acids are NOT DETECTED.  The SARS-CoV-2 RNA is generally detectable in upper respiratory specimens during the acute phase of infection. The lowest concentration of SARS-CoV-2 viral copies this assay can detect is 138 copies/mL. A negative result does not preclude SARS-Cov-2 infection and should not be used as the sole basis for treatment or other patient management decisions. A negative result may occur with  improper specimen collection/handling, submission of specimen other than nasopharyngeal swab, presence of viral mutation(s) within the areas targeted by this assay, and  inadequate number of viral copies(<138 copies/mL). A negative result must be combined with clinical observations, patient history, and epidemiological information. The expected result is Negative.  Fact Sheet for Patients:  BloggerCourse.com  Fact Sheet for Healthcare Providers:  SeriousBroker.it  This test is no t yet approved or  cleared by the [place] and  has been authorized for detection and/or diagnosis of SARS-CoV-2 by FDA under an Emergency Use Authorization (EUA). This EUA will remain  in effect (meaning this test can be used) for the duration of the COVID-19 declaration under Section 564(b)(1) of the Act, 21 U.S.C.section 360bbb-3(b)(1), unless the authorization is terminated  or revoked sooner.       Influenza A by PCR NEGATIVE NEGATIVE Final   Influenza B by PCR NEGATIVE NEGATIVE Final    Comment: (NOTE) The Xpert Xpress SARS-CoV-2/FLU/RSV plus assay is intended as an aid in the diagnosis of influenza from Nasopharyngeal swab specimens and should not be used as a sole basis for treatment. Nasal washings and aspirates are unacceptable for Xpert Xpress SARS-CoV-2/FLU/RSV testing.  Fact Sheet for Patients: BloggerCourse.com  Fact Sheet for Healthcare Providers: SeriousBroker.it  This test is not yet approved or cleared by the [place] FDA and has been authorized for detection and/or diagnosis of SARS-CoV-2 by FDA under an Emergency Use Authorization (EUA). This EUA will remain in effect (meaning this test can be used) for the duration of the COVID-19 declaration under Section 564(b)(1) of the Act, 21 U.S.C. section 360bbb-3(b)(1), unless the authorization is terminated or revoked.  Performed at [facility], [address], [place], [place] [address]          Radiology Studies: No results found.      Scheduled Meds:  amiodarone   200 mg Oral BID   aspirin  81 mg Oral Daily   atorvastatin  40 mg Oral Daily   DULoxetine  20 mg Oral Daily   enoxaparin (LOVENOX) injection  1 mg/kg Subcutaneous Q12H   ferrous sulfate  325 mg Oral BID   fluticasone  2 spray Each Nare BID   folic acid  1 mg Oral Daily   furosemide  60 mg Intravenous Q12H   insulin aspart  0-15 Units Subcutaneous TID WC   insulin aspart  0-5 Units Subcutaneous QHS   LORazepam  0-4 mg Intravenous Q12H   LORazepam  1 mg Intravenous Once   magnesium oxide  400 mg Oral BID   metoprolol succinate  100 mg Oral Daily   montelukast  10 mg Oral QHS   multivitamin with minerals  1 tablet Oral Daily   potassium chloride  40 mEq Oral Daily   sodium chloride flush  3 mL Intravenous Q12H   thiamine  100 mg Oral Daily   Or   thiamine  100 mg Intravenous Daily   warfarin  12 mg Oral ONCE-1600   Warfarin - Pharmacist Dosing Inpatient   Does not apply q1600   Continuous Infusions:  sodium chloride     cefTRIAXone (ROCEPHIN)  IV 2 g (01/29/20 1219)   levothyroxine     traZODone       LOS: 2 days    Time spent: 50 min    Silvano Bilis, MD [facility]   If 7PM-7AM, please contact night-coverage [email or web address] Password [email or web address] 01/29/2020, 1:19 PM

## 2020-01-29 NOTE — Consult Note (Addendum)
Pharmacy Antibiotic Note  [name] [name] is a 59 y.o. male admitted on 01/27/2020 with presenting with shortness of breath. PMH includes deafness in left ear, morbid obesity, COPD, grade 1 diastolic dysfunction, HTN, hypothyroidism, anxiety/depression, obstructive sleep apnea endorses compliance on CPAP, hyperlipidemia, alcohol abuse, chronic atrial fibrillation and pulmonary emboli on warfarin. Pt recently had admission in November with a right lateral LL wound that was cultured and subsequently grew out MRSA. Pt now has bilateral lower extremity venous stasis dermatitis with worsening redness and warmth - covering for possible cellulitis. Pt received vancomycin during November visit with vancomycin trough 12/27/19 @ 0444: 24 mcg/mL while on vancomycin 1500 mg q12h (dosed based on nomogram). Due to historical elevated level, will dose pt with previous dose of 2000 mg q24h.   Plan: vancomycin 2500 mg load, followed by 2000 mg IV every 24 hours  Daily SCr while on vancomycin to assess renal function  Levels as clinically indicated  Weight: (!) 186 kg (410 lb)  Temp (24hrs), Avg:98.2 F (36.8 C), Min:97.8 F (36.6 C), Max:98.6 F (37 C)  Last Labs          Recent Labs  Lab 12/22/19 1259 12/23/19 0537 12/24/19 0423 12/25/19 0519 12/26/19 0504  WBC 9.3 7.2  --   --   --   CREATININE 0.82 0.87 0.96 1.02 0.93      Estimated Creatinine Clearance: 139.6 mL/min (by C-G formula based on SCr of 0.93 mg/dL).    No Known Allergies  Antimicrobials this admission: 12/13 ceftriaxone >> 12/14 12/14 vancomycin >>  Microbiology results: 12/12 respiratory panel: negative  Thank you for allowing pharmacy to be a part of this patient's care  Reatha Armour, PharmD Pharmacy Resident  01/29/2020 1:59 PM

## 2020-01-29 NOTE — Progress Notes (Signed)
Patient refusing bed/chair alarm. Educated on safety. Agrees to call for assistance prior to getting up.

## 2020-01-30 DIAGNOSIS — I4891 Unspecified atrial fibrillation: Secondary | ICD-10-CM

## 2020-01-30 DIAGNOSIS — I5033 Acute on chronic diastolic (congestive) heart failure: Secondary | ICD-10-CM

## 2020-01-30 LAB — GLUCOSE, CAPILLARY
Glucose-Capillary: 119 mg/dL — ABNORMAL HIGH (ref 70–99)
Glucose-Capillary: 138 mg/dL — ABNORMAL HIGH (ref 70–99)
Glucose-Capillary: 133 mg/dL — ABNORMAL HIGH (ref 70–99)
Glucose-Capillary: 129 mg/dL — ABNORMAL HIGH (ref 70–99)

## 2020-01-30 LAB — CBC
HCT: 33.7 % — ABNORMAL LOW (ref 39.0–52.0)
Hemoglobin: 10.8 g/dL — ABNORMAL LOW (ref 13.0–17.0)
MCH: 30.4 pg (ref 26.0–34.0)
MCHC: 32 g/dL (ref 30.0–36.0)
MCV: 94.9 fL (ref 80.0–100.0)
Platelets: 153 10*3/uL (ref 150–400)
RBC: 3.55 MIL/uL — ABNORMAL LOW (ref 4.22–5.81)
RDW: 16.8 % — ABNORMAL HIGH (ref 11.5–15.5)
WBC: 4.8 10*3/uL (ref 4.0–10.5)
nRBC: 0 % (ref 0.0–0.2)

## 2020-01-30 LAB — BASIC METABOLIC PANEL
Anion gap: 11 (ref 5–15)
BUN: 8 mg/dL (ref 6–20)
CO2: 33 mmol/L — ABNORMAL HIGH (ref 22–32)
Calcium: 7.6 mg/dL — ABNORMAL LOW (ref 8.9–10.3)
Chloride: 92 mmol/L — ABNORMAL LOW (ref 98–111)
Creatinine, Ser: 0.91 mg/dL (ref 0.61–1.24)
GFR, Estimated: >60 mL/min (ref >60)
Glucose, Bld: 129 mg/dL — ABNORMAL HIGH (ref 70–99)
Potassium: 3.4 mmol/L — ABNORMAL LOW (ref 3.5–5.1)
Sodium: 136 mmol/L (ref 135–145)

## 2020-01-30 LAB — PROTIME-INR
INR: 2 — ABNORMAL HIGH (ref 0.8–1.2)
Prothrombin Time: 22.2 seconds — ABNORMAL HIGH (ref 11.4–15.2)

## 2020-01-30 MED ORDER — CARISOPRODOL 350 MG PO TABS
350.0000 mg | ORAL_TABLET | Freq: Three times a day (TID) | ORAL | Status: DC
  Administered 2020-01-30 – 2020-01-31 (×2): 350 mg via ORAL
  Filled 2020-01-30 (×3): qty 1

## 2020-01-30 MED ORDER — POTASSIUM CHLORIDE CRYS ER 20 MEQ PO TBCR
40.0000 meq | EXTENDED_RELEASE_TABLET | Freq: Every day | ORAL | Status: DC
  Administered 2020-01-31 – 2020-02-02 (×3): 40 meq via ORAL
  Filled 2020-01-30 (×3): qty 2

## 2020-01-30 MED ORDER — WARFARIN SODIUM 5 MG PO TABS
5.0000 mg | ORAL_TABLET | Freq: Once | ORAL | Status: AC
  Administered 2020-01-30: 16:00:00 5 mg via ORAL
  Filled 2020-01-30: qty 1

## 2020-01-30 NOTE — Progress Notes (Signed)
[facility]    SUBJECTIVE: the patient reports that his breathing is somewhat better today, but not back to baseline. He denies any significant chest pain beyond is chronic discomfort.    Vitals:   01/29/20 1537 01/29/20 2007 01/30/20 0322 01/30/20 0755  BP: 137/60 (!) 105/53 (!) 91/58 (!) 126/91  Pulse: 71 88 74 83  Resp: 18 18 18    Temp: 98.1 F (36.7 C) 98.7 F (37.1 C) 98.2 F (36.8 C) 98.4 F (36.9 C)  TempSrc: Oral Oral Oral Oral  SpO2: 98% 97% 98% 90%  Weight:   (!) 193.1 kg   Height:         Intake/Output Summary (Last 24 hours) at 01/30/2020 02/01/2020 Last data filed at 01/30/2020 02/01/2020 Gross per 24 hour  Intake 705.11 ml  Output 2475 ml  Net -1769.89 ml      PHYSICAL EXAM  General: morbidly obese gentleman sitting up in recliner watching TV in no acute distress HEENT:  Normocephalic and atramatic Neck:   No JVD.  Lungs: Clear bilaterally to auscultation, normal effort of breathing on room air. Heart: HRRR . Normal S1 and S2 without gallops or murmurs.  Msk:  Back normal, gait not assessed. Normal strength and tone for age. Extremities: lower extremity edema and chronic venous stasis skin changes   Neuro: Alert and oriented X 3. Psych:  Good affect, responds appropriately   LABS: Basic Metabolic Panel: Recent Labs    01/29/20 0442 01/30/20 0524  NA 135 136  K 3.6 3.4*  CL 91* 92*  CO2 32 33*  GLUCOSE 121* 129*  BUN 8 8  CREATININE 0.93 0.91  CALCIUM 7.6* 7.6*   Liver Function Tests: No results for input(s): AST, ALT, ALKPHOS, BILITOT, PROT, ALBUMIN in the last 72 hours. No results for input(s): LIPASE, AMYLASE in the last 72 hours. CBC: Recent Labs    01/29/20 0442 01/30/20 0524  WBC 5.1 4.8  HGB 11.0* 10.8*  HCT 34.1* 33.7*  MCV 93.9 94.9  PLT 160 153   Cardiac Enzymes: No results for input(s): CKTOTAL, CKMB, CKMBINDEX, TROPONINI in the last 72 hours. BNP: Invalid input(s): POCBNP D-Dimer: No results for input(s): DDIMER in the  last 72 hours. Hemoglobin A1C: No results for input(s): HGBA1C in the last 72 hours. Fasting Lipid Panel: No results for input(s): CHOL, HDL, LDLCALC, TRIG, CHOLHDL, LDLDIRECT in the last 72 hours. Thyroid Function Tests: Recent Labs    01/27/20 0956  TSH 12.805*   Anemia Panel: No results for input(s): VITAMINB12, FOLATE, FERRITIN, TIBC, IRON, RETICCTPCT in the last 72 hours.  No results found.   Echo Echo LVEF 55 to 60% on 12/24/2019  TELEMETRY: atrial fibrillation, 80s bpm  ASSESSMENT AND PLAN:  Principal Problem:   Acute on chronic respiratory failure with hypoxia (HCC) Active Problems:   Acute on chronic diastolic CHF (congestive heart failure) (HCC)   HTN (hypertension)   Morbid obesity with BMI of 60.0-69.9, adult (HCC)   History of pulmonary embolism (on Coumadin)   Atrial fibrillation with RVR (HCC)   CAD (coronary artery disease)   Long term (current) use of anticoagulants   Alcohol withdrawal syndrome without complication (HCC)   CHF (congestive heart failure) (HCC)    1.Acute on chronic respiratory failure with hypoxia, multifactorial, secondary to COPD, obstructive sleep apnea, acute on chronic diastolic congestive heart failure, exacerbated by medical and dietary noncompliance, obesity, and daily alcohol abuse. Patient has a history of prior pulmonary embolus, subtherapeutic on warfarin. ChestCTdoes not reveal pulmonary embolus.  2.Acute on chronic diastolic congestive heart failure, likely noncompliant with diet, BNP 279.8,on IV furosemide 60 mg BID 3.Atrial fibrillation with rapid ventricular rate, therapeutic on warfarin for stroke prevention, rate controlled. On amiodarone 200 mg BID and metoprolol succinate 100 mg daily. 4.Chest pain, atypical features, borderline elevated high-sensitivity troponin (62, 68, 91, 37)without delta, with nondiagnostic ECG 5.Alcohol withdrawal syndrome, with tremulousness and tachycardia,inpatient with daily  alcohol use 6. History of pulmonary embolus,therapeutic on warfarin,noevidence of current PE on chest CT  Recommendations: 1.  Recommend switching to oral Lasix- 40 mg BID 2.  Continue metoprolol succinate 100 mg daily and amiodarone 200 mg BID for rate and rhythm control 3.  Continue warfarin with goal INR 2-3 4.  Consider discharge today or tomorrow, and follow-up with Heart Failure clinic and Dr. Juliann Pares in 1-2 weeks from discharge    Rockie Neighbours 01/30/2020 8:29 AM

## 2020-01-30 NOTE — Consult Note (Signed)
WOC Nurse wound follow up Wound type:No wounds Measurement:no wounds Wound bed: no wounds Drainage (amount, consistency, odor) none Periwound:edema, skin changes consistent with venous statis  Dressing procedure/placement/frequency: Unna's boots applied bilaterally  Will need HHRN for compression wrap changes or weekly follow up in MD office for Unna's boot changes.   WOC nursing team to follow along for Unna's boots change weekly on Wednesdays   [facility], CNS, [place] [phone]

## 2020-01-30 NOTE — Progress Notes (Signed)
ANTICOAGULATION CONSULT NOTE  Pharmacy Consult for Warfarin with Lovenox Bridge Indication: Afib and PE  No Known Allergies  Patient Measurements: Height: 5\' 8"  (172.7 cm) Weight: (!) 193.1 kg (425 lb 12.8 oz) IBW/kg (Calculated) : 68.4  Vital Signs: Temp: 98.4 F (36.9 C) (12/15 0755) Temp Source: Oral (12/15 0755) BP: 126/91 (12/15 0755) Pulse Rate: 83 (12/15 0755)  Labs: Recent Labs    01/28/20 0442 01/28/20 1201 01/29/20 0442 01/30/20 0524  HGB  --   --  11.0* 10.8*  HCT  --   --  34.1* 33.7*  PLT  --   --  160 153  LABPROT 14.9  --  17.0* 22.2*  INR 1.2  --  1.4* 2.0*  CREATININE 0.95  --  0.93 0.91  TROPONINIHS  --  37*  --   --     Estimated Creatinine Clearance: 146.3 mL/min (by C-G formula based on SCr of 0.91 mg/dL).  Medical History: Past Medical History:  Diagnosis Date  . Allergy   . Anxiety   . Arthritis   . Asthma   . Brain damage   . Chronic pain of both knees 07/13/2018  . Clotting disorder (HCC)   . COPD (chronic obstructive pulmonary disease) (HCC)   . Depression   . GERD (gastroesophageal reflux disease)   . HFrEF (heart failure with reduced ejection fraction) (HCC)    a. 03/2018 Echo: EF 25-30%, diff HK. Mod LAE.  04/2018 History of DVT (deep vein thrombosis)   . History of pulmonary embolism    a. Chronic coumadin.  [name] Hypertension   . MI (myocardial infarction) (HCC)   . Morbid obesity (HCC)   . Neck pain 07/13/2018  . NICM (nonischemic cardiomyopathy) (HCC)    a. s/p Cath x 3 - reportedly nl cors. Last cath 2019 in [place]; b. a. 03/2018 Echo: EF 25-30%, diff HK.  04/2018 Persistent atrial fibrillation (HCC)    a. 03/2018 s/p DCCV; b. CHA2DS2VASc = 1-->Xarelto (later changed to warfarin); c. 05/2018 recurrent afib-->Amio initiated.  . Sleep apnea   . Sleep apnea    Medications:  PTA - Coumadin 5mg  daily, pt reports last dose pta 5mg  12/11 PTA - Amiodarone 200 mg twice daily - DDI may increase INR  Ceftriaxone 2 g q24h -  DDI may increase INR    Assessment: 59 year old male presenting with shortness of breath. PMH includes deafness in left ear, morbid obesity, COPD, grade 1 diastolic dysfunction, HTN, hypothyroidism, anxiety/depression, obstructive sleep apnea endorses compliance on CPAP, hyperlipidemia, alcohol abuse, chronic atrial fibrillation and pulmonary emboli on warfarin. Reported warfarin dose prior to admission 5mg  daily. Pt currently on lovenox and warfarin for bridging therapy.   Hgb 10.8 (stable), plt 282>146>99>296>153   Date INR Warfarin dose   5mg  (last dose pta per report) 12/12  1.2 7.5mg  12/13 1.2 10mg  12/14 1.4 12mg  12/15 2.0 5mg   Goal of Therapy:  INR Goal 2-3  Plan:  - Continue Lovenox 1 mg/kg Q12hr.  - Ordered Warfarin 5mg  at 1600 today.   - Will continue Lovenox, order INR, and adjust warfarin dose, if warranted. Hopefully d/c tomorrow  - Daily until INR is therapeutic x 2 days.   - Will continue to monitor SCr, H&H, and Platelets.    , PharmD Pharmacy Resident  01/30/2020 10:54 AM

## 2020-01-30 NOTE — Hospital Course (Signed)
From 12/14 note by Dr. Ashok Pall: "[name] [name] is a 59 y.o. male with medical history significant for diastolic CHF, COPD on home O2 at 3 L, OSA on CPAP, morbid obesity, severe alcohol use disorder, chronic A. fib, history of PE on Coumadin, who presents to the emergency room with sudden onset shortness of breath that woke him up from sleep about 2 hours prior to arrival in the ER.  He has associated retrosternal chest tightness of moderate intensity, nonradiating, with no aggravating or alleviating factors.  Has Associated nausea but without vomiting or diaphoresis.  Denies cough, fever or chills.  Denies abdominal pain vomiting and diarrhea While in the emergency room patient was noted to have a mild tremor on CIWA protocol was initiated.  He was treated with IV Lasix for CHF exacerbation.  Hospitalist consulted for admission."

## 2020-01-30 NOTE — Progress Notes (Signed)
PROGRESS NOTE    [name] [name]   ZHY:[id number]  DOB: 12-Mar-1960  PCP: [name], DO    DOA: 01/27/2020 LOS: 3   Brief Narrative   From 12/14 note by Dr. Ashok Pall: "[name] [name] is a 59 y.o. male with medical history significant for diastolic CHF, COPD on home O2 at 3 L, OSA on CPAP, morbid obesity, severe alcohol use disorder, chronic A. fib, history of PE on Coumadin, who presents to the emergency room with sudden onset shortness of breath that woke him up from sleep about 2 hours prior to arrival in the ER.  He has associated retrosternal chest tightness of moderate intensity, nonradiating, with no aggravating or alleviating factors.  Has Associated nausea but without vomiting or diaphoresis.  Denies cough, fever or chills.  Denies abdominal pain vomiting and diarrhea While in the emergency room patient was noted to have a mild tremor on CIWA protocol was initiated.  He was treated with IV Lasix for CHF exacerbation.  Hospitalist consulted for admission."     Assessment & Plan   Principal Problem:   Acute on chronic respiratory failure with hypoxia (HCC) Active Problems:   Acute on chronic diastolic CHF (congestive heart failure) (HCC)   HTN (hypertension)   Morbid obesity with BMI of 60.0-69.9, adult (HCC)   History of pulmonary embolism (on Coumadin)   Atrial fibrillation with RVR (HCC)   CAD (coronary artery disease)   Long term (current) use of anticoagulants   Alcohol withdrawal syndrome without complication (HCC)   CHF (congestive heart failure) (HCC)   Acute on chronic respiratory failure with hypoxia secondary to acute on chronic diastolic CHF and A. fib with RVR -patient requires 3 L/min supplemental oxygen at baseline.  He presented with increased oxygen requirement up to 6 L/min to maintain O2 sats in the low 90s.  Also had acute dyspnea and increased work of breathing conversational dyspnea at the time of admission. --Supplemental oxygen as needed  to maintain O2 sat greater than 88%, wean as tolerated  Acute on chronic diastolic CHF A. fib with RVR -POA, heart rate now controlled. --Continue Lasix 60 mg IV twice daily (takes oral 40 mg twice daily at home) --Cardiology consulted, appreciate recommendations --Continue metoprolol and amiodarone --Warfarin dosing per pharmacy, Daily INRs --Strict I/O's and daily weights --Monitor renal function electrolytes closely  Bilateral lower extremity venous stasis dermatitis and recurrent cellulitis -was started on vancomycin for concern of recurrent cellulitis.  Recently treated with Rocephin for same in November.  Does have prior history of MRSA positive culture in November.  Monitor clinically.   Patient been afebrile and without leukocytosis, but did report increase in the warmth and erythema prior to admission --WOC consult --Elevate legs and compression wraps  --Continue vancomycin for now  Alcohol abuse -patient reported last drink was 12/10, prior to that last drink was 3 weeks ago.  CIWA protocol is been discontinued.  Continue thiamine supplement.  Hypertension -now with soft BPs in the setting of diuresis.  Continue metoprolol, diuresis as above.  As needed hydralazine.  History of PE on Coumadin -patient previously had breakthrough PE while on NOAC, hence Coumadin.  Warfarin dosing per pharmacy.  Due to subtherapeutic INR, therapeutic dose Lovenox was started and can be stopped once INR is therapeutic.  Elevated troponin -likely demand ischemia in the setting of acute on chronic hypoxia.  Patient had brief episode of chest pain which is resolved, no ischemic changes on EKG.  Continue aspirin and Lipitor.  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  Continue daily supplement.  OSA -on CPAP, ordered  Hypothyroidism -continue Synthroid  Type 2 diabetes -A1c 6.2% in September 2021.  Sliding scale  NovoLog.  Monitor.  Insomnia -continue trazodone    Morbid obesity: Body mass index is 64.74 kg/m.  Significantly complicates overall care and prognosis.  Patient would significantly benefit from lifestyle modifications aimed at weight loss.  DVT prophylaxis:    Diet:  Diet Orders (From admission, onward)    Start     Ordered   01/27/20 0436  Diet heart healthy/carb modified Room service appropriate? Yes; Fluid consistency: Thin  Diet effective now       Question Answer Comment  Diet-HS Snack? Nothing   Room service appropriate? Yes   Fluid consistency: Thin      01/27/20 0439            Code Status: Full Code    Subjective 01/30/20    Patient seen at bedside today.  He was still on CPAP and sleeping on his side.  Reported having a bad crick in his neck and agrees muscle relaxer may help, states soma works best for him.  Also complains of some left shoulder pain.  No shortness of breath, chest pain or other acute complaints at this time   Disposition Plan & Communication   Status is: Inpatient  Remains inpatient appropriate because:IV treatments appropriate due to intensity of illness or inability to take PO   Dispo: The patient is from: Home              Anticipated d/c is to: Home              Anticipated d/c date is: 2 days              Patient currently is not medically stable to d/c.   Family Communication: None at bedside, will attempt to call   Consults, Procedures, Significant Events   Consultants:   Cardiology  Procedures:   None  Antimicrobials:  Anti-infectives (From admission, onward)   Start     Dose/Rate Route Frequency Ordered Stop   01/30/20 1500  vancomycin (VANCOREADY) IVPB 2000 mg/400 mL        2,000 mg 200 mL/hr over 120 Minutes Intravenous Every 24 hours 01/29/20 1353     01/29/20 1445  vancomycin (VANCOREADY) IVPB 2000 mg/400 mL       "Followed by" Linked Group Details   2,000 mg 200 mL/hr over 120 Minutes Intravenous  Once  01/29/20 1353 01/29/20 1642   01/29/20 1445  vancomycin (VANCOREADY) IVPB 500 mg/100 mL       "Followed by" Linked Group Details   500 mg 100 mL/hr over 60 Minutes Intravenous  Once 01/29/20 1353 01/29/20 1805   01/29/20 1430  vancomycin (VANCOREADY) IVPB 2000 mg/400 mL  Status:  Discontinued        2,000 mg 200 mL/hr over 120 Minutes Intravenous Every 24 hours 01/29/20 1342 01/29/20 1350   01/29/20 1415  vancomycin (VANCOCIN) IVPB 1000 mg/200 mL premix  Status:  Discontinued        1,000 mg 200 mL/hr over 60 Minutes Intravenous  Once 01/29/20 1326 01/29/20 1327   01/28/20 1130  cefTRIAXone (ROCEPHIN) 2 g in sodium chloride 0.9 % 100 mL IVPB  Status:  Discontinued        2 g 200 mL/hr over 30 Minutes Intravenous Every 24 hours 01/28/20  1119 01/29/20 1326   01/28/20 1030  cefTRIAXone (ROCEPHIN) 2 g in sodium chloride 0.9 % 100 mL IVPB  Status:  Discontinued        2 g 200 mL/hr over 30 Minutes Intravenous Every 12 hours 01/28/20 1028 01/28/20 1104        Objective   Vitals:   01/30/20 0322 01/30/20 0755 01/30/20 1124 01/30/20 1643  BP: (!) 91/58 (!) 126/91 (!) 107/52 123/65  Pulse: 74 83 64 78  Resp: 18     Temp: 98.2 F (36.8 C) 98.4 F (36.9 C) 98.5 F (36.9 C) 98.6 F (37 C)  TempSrc: Oral Oral Oral Oral  SpO2: 98% 90% 100% 100%  Weight: (!) 193.1 kg     Height:        Intake/Output Summary (Last 24 hours) at 01/30/2020 1654 Last data filed at 01/30/2020 1124 Gross per 24 hour  Intake 745.11 ml  Output 2000 ml  Net -1254.89 ml   Filed Weights   01/27/20 0315 01/29/20 0432 01/30/20 0322  Weight: (!) 186 kg (!) 191.1 kg (!) 193.1 kg    Physical Exam:  General exam: Sleeping comfortable but wakes easily to voice, no acute distress, morbidly obese HEENT: On CPAP, hearing grossly normal  Respiratory system: CTAB but diminished due to body habitus, no wheezes, rales or rhonchi, normal respiratory effort. Cardiovascular system: normal S1/S2, RRR Central nervous  system: no gross focal neurologic deficits, normal speech Extremities: Bilateral lower extremity venous stasis changes appear symmetric in degree of erythema and warmth  Labs   Data Reviewed: I have personally reviewed following labs and imaging studies  CBC: Recent Labs  Lab 01/27/20 0319 01/29/20 0442 01/30/20 0524  WBC 8.4 5.1 4.8  NEUTROABS 5.9  --   --   HGB 12.0* 11.0* 10.8*  HCT 37.8* 34.1* 33.7*  MCV 92.4 93.9 94.9  PLT 296 160 153   Basic Metabolic Panel: Recent Labs  Lab 01/27/20 0319 01/27/20 0454 01/28/20 0442 01/29/20 0442 01/30/20 0524  NA 136  --  135 135 136  K 3.7  --  3.6 3.6 3.4*  CL 94*  --  90* 91* 92*  CO2 25  --  34* 32 33*  GLUCOSE 172*  --  123* 121* 129*  BUN 6  --  8 8 8   CREATININE 0.94  --  0.95 0.93 0.91  CALCIUM 7.8*  --  7.7* 7.6* 7.6*  MG  --  1.5*  --   --   --   PHOS  --  2.3*  --   --   --    GFR: Estimated Creatinine Clearance: 146.3 mL/min (by C-G formula based on SCr of 0.91 mg/dL). Liver Function Tests: Recent Labs  Lab 01/27/20 0319  AST 57*  ALT 31  ALKPHOS 107  BILITOT 1.0  PROT 7.2  ALBUMIN 3.3*   No results for input(s): LIPASE, AMYLASE in the last 168 hours. No results for input(s): AMMONIA in the last 168 hours. Coagulation Profile: Recent Labs  Lab 01/27/20 0319 01/28/20 0442 01/29/20 0442 01/30/20 0524  INR 1.2 1.2 1.4* 2.0*   Cardiac Enzymes: No results for input(s): CKTOTAL, CKMB, CKMBINDEX, TROPONINI in the last 168 hours. BNP (last 3 results) No results for input(s): PROBNP in the last 8760 hours. HbA1C: No results for input(s): HGBA1C in the last 72 hours. CBG: Recent Labs  Lab 01/29/20 1643 01/29/20 2004 01/30/20 0756 01/30/20 1124 01/30/20 1644  GLUCAP 123* 124* 119* 138* 133*   Lipid Profile:  No results for input(s): CHOL, HDL, LDLCALC, TRIG, CHOLHDL, LDLDIRECT in the last 72 hours. Thyroid Function Tests: No results for input(s): TSH, T4TOTAL, FREET4, T3FREE, THYROIDAB in the  last 72 hours. Anemia Panel: No results for input(s): VITAMINB12, FOLATE, FERRITIN, TIBC, IRON, RETICCTPCT in the last 72 hours. Sepsis Labs: No results for input(s): PROCALCITON, LATICACIDVEN in the last 168 hours.  Recent Results (from the past 240 hour(s))  Resp Panel by RT-PCR (Flu A&B, Covid) Nasopharyngeal Swab     Status: None   Collection Time: 01/27/20  3:19 AM   Specimen: Nasopharyngeal Swab; Nasopharyngeal(NP) swabs in vial transport medium  Result Value Ref Range Status   SARS Coronavirus 2 by RT PCR NEGATIVE NEGATIVE Final    Comment: (NOTE) SARS-CoV-2 target nucleic acids are NOT DETECTED.  The SARS-CoV-2 RNA is generally detectable in upper respiratory specimens during the acute phase of infection. The lowest concentration of SARS-CoV-2 viral copies this assay can detect is 138 copies/mL. A negative result does not preclude SARS-Cov-2 infection and should not be used as the sole basis for treatment or other patient management decisions. A negative result may occur with  improper specimen collection/handling, submission of specimen other than nasopharyngeal swab, presence of viral mutation(s) within the areas targeted by this assay, and inadequate number of viral copies(<138 copies/mL). A negative result must be combined with clinical observations, patient history, and epidemiological information. The expected result is Negative.  Fact Sheet for Patients:  BloggerCourse.com  Fact Sheet for Healthcare Providers:  SeriousBroker.it  This test is no t yet approved or cleared by the [place] FDA and  has been authorized for detection and/or diagnosis of SARS-CoV-2 by FDA under an Emergency Use Authorization (EUA). This EUA will remain  in effect (meaning this test can be used) for the duration of the COVID-19 declaration under Section 564(b)(1) of the Act, 21 U.S.C.section 360bbb-3(b)(1), unless the authorization is  terminated  or revoked sooner.       Influenza A by PCR NEGATIVE NEGATIVE Final   Influenza B by PCR NEGATIVE NEGATIVE Final    Comment: (NOTE) The Xpert Xpress SARS-CoV-2/FLU/RSV plus assay is intended as an aid in the diagnosis of influenza from Nasopharyngeal swab specimens and should not be used as a sole basis for treatment. Nasal washings and aspirates are unacceptable for Xpert Xpress SARS-CoV-2/FLU/RSV testing.  Fact Sheet for Patients: BloggerCourse.com  Fact Sheet for Healthcare Providers: SeriousBroker.it  This test is not yet approved or cleared by the [place] FDA and has been authorized for detection and/or diagnosis of SARS-CoV-2 by FDA under an Emergency Use Authorization (EUA). This EUA will remain in effect (meaning this test can be used) for the duration of the COVID-19 declaration under Section 564(b)(1) of the Act, 21 U.S.C. section 360bbb-3(b)(1), unless the authorization is terminated or revoked.  Performed at [facility], [address] [place], [place] [address]       Imaging Studies   No results found.   Medications   Scheduled Meds: . amiodarone  200 mg Oral BID  . aspirin  81 mg Oral Daily  . atorvastatin  40 mg Oral Daily  . carisoprodol  350 mg Oral TID  . DULoxetine  20 mg Oral Daily  . enoxaparin (LOVENOX) injection  1 mg/kg Subcutaneous Q12H  . ferrous sulfate  325 mg Oral BID  . fluticasone  2 spray Each Nare BID  . folic acid  1 mg Oral Daily  . furosemide  60 mg Intravenous Q12H  .  insulin aspart  0-15 Units Subcutaneous TID WC  . insulin aspart  0-5 Units Subcutaneous QHS  . levothyroxine  75 mcg Oral Q0600  . LORazepam  0-4 mg Intravenous Q12H  . magnesium oxide  400 mg Oral BID  . metoprolol succinate  100 mg Oral Daily  . montelukast  10 mg Oral QHS  . multivitamin with minerals  1 tablet Oral Daily  . [START ON 01/31/2020] potassium chloride  40 mEq  Oral Daily  . sodium chloride flush  3 mL Intravenous Q12H  . thiamine  100 mg Oral Daily   Or  . thiamine  100 mg Intravenous Daily  . traZODone  150 mg Oral QHS  . Warfarin - Pharmacist Dosing Inpatient   Does not apply q1600   Continuous Infusions: . sodium chloride    . vancomycin 2,000 mg (01/30/20 1601)       LOS: 3 days    Time spent: 30 minutes with greater than 50% spent at bedside and in coordination of care    Pennie Banter, DO [facility]  01/30/2020, 4:54 PM    If 7PM-7AM, please contact night-coverage. How to contact the [facility] Attending or Consulting provider 7A - 7P or covering provider during after hours 7P -7A, for this patient?    1. Check the care team in [facility] and look for a) attending/consulting [facility] provider listed and b) the [facility] team listed 2. Log into [email or web address] and use [place]'s universal password to access. If you do not have the password, please contact the hospital operator. 3. Locate the [facility] provider you are looking for under [facility] and page to a number that you can be directly reached. 4. If you still have difficulty reaching the provider, please page the [facility] (Director on Call) for the Hospitalists listed on amion for assistance.

## 2020-01-31 DIAGNOSIS — Z86711 Personal history of pulmonary embolism: Secondary | ICD-10-CM

## 2020-01-31 LAB — GLUCOSE, CAPILLARY
Glucose-Capillary: 136 mg/dL — ABNORMAL HIGH (ref 70–99)
Glucose-Capillary: 115 mg/dL — ABNORMAL HIGH (ref 70–99)
Glucose-Capillary: 141 mg/dL — ABNORMAL HIGH (ref 70–99)
Glucose-Capillary: 135 mg/dL — ABNORMAL HIGH (ref 70–99)

## 2020-01-31 LAB — BASIC METABOLIC PANEL
Anion gap: 10 (ref 5–15)
BUN: 8 mg/dL (ref 6–20)
CO2: 34 mmol/L — ABNORMAL HIGH (ref 22–32)
Calcium: 8.2 mg/dL — ABNORMAL LOW (ref 8.9–10.3)
Chloride: 90 mmol/L — ABNORMAL LOW (ref 98–111)
Creatinine, Ser: 0.88 mg/dL (ref 0.61–1.24)
GFR, Estimated: >60 mL/min (ref >60)
Glucose, Bld: 123 mg/dL — ABNORMAL HIGH (ref 70–99)
Potassium: 3.5 mmol/L (ref 3.5–5.1)
Sodium: 134 mmol/L — ABNORMAL LOW (ref 135–145)

## 2020-01-31 LAB — MAGNESIUM: Magnesium: 1.9 mg/dL (ref 1.7–2.4)

## 2020-01-31 LAB — CBC
HCT: 38 % — ABNORMAL LOW (ref 39.0–52.0)
Hemoglobin: 12.1 g/dL — ABNORMAL LOW (ref 13.0–17.0)
MCH: 30.5 pg (ref 26.0–34.0)
MCHC: 31.8 g/dL (ref 30.0–36.0)
MCV: 95.7 fL (ref 80.0–100.0)
Platelets: 160 10*3/uL (ref 150–400)
RBC: 3.97 MIL/uL — ABNORMAL LOW (ref 4.22–5.81)
RDW: 17.2 % — ABNORMAL HIGH (ref 11.5–15.5)
WBC: 4.4 10*3/uL (ref 4.0–10.5)
nRBC: 0 % (ref 0.0–0.2)

## 2020-01-31 LAB — MRSA PCR SCREENING: MRSA by PCR: NEGATIVE

## 2020-01-31 LAB — PROTIME-INR
INR: 2.3 — ABNORMAL HIGH (ref 0.8–1.2)
Prothrombin Time: 24.6 seconds — ABNORMAL HIGH (ref 11.4–15.2)

## 2020-01-31 MED ORDER — METHOCARBAMOL 500 MG PO TABS
500.0000 mg | ORAL_TABLET | Freq: Four times a day (QID) | ORAL | Status: DC
  Administered 2020-01-31 – 2020-02-01 (×5): 500 mg via ORAL
  Filled 2020-01-31 (×8): qty 1

## 2020-01-31 MED ORDER — FUROSEMIDE 10 MG/ML IJ SOLN
INTRAMUSCULAR | Status: AC
  Filled 2020-01-31: qty 8

## 2020-01-31 MED ORDER — WARFARIN SODIUM 5 MG PO TABS
5.0000 mg | ORAL_TABLET | Freq: Once | ORAL | Status: AC
  Administered 2020-01-31: 17:00:00 5 mg via ORAL
  Filled 2020-01-31: qty 1

## 2020-01-31 MED ORDER — HYDROCODONE-ACETAMINOPHEN 7.5-325 MG PO TABS
1.0000 | ORAL_TABLET | Freq: Four times a day (QID) | ORAL | Status: DC | PRN
  Administered 2020-01-31: 1 via ORAL
  Administered 2020-01-31 – 2020-02-01 (×3): 2 via ORAL
  Administered 2020-02-01: 1 via ORAL
  Administered 2020-02-01 – 2020-02-02 (×2): 2 via ORAL
  Filled 2020-01-31 (×4): qty 2
  Filled 2020-01-31: qty 1
  Filled 2020-01-31 (×2): qty 2

## 2020-01-31 NOTE — Progress Notes (Signed)
[facility]  SUBJECTIVE: Patient laying flat in bed, still on supplemental oxygen, gradually improving   Vitals:   01/30/20 1643 01/30/20 2008 01/31/20 0211 01/31/20 0414  BP: 123/65 92/63  (!) 141/68  Pulse: 78 94  66  Resp:  19  19  Temp: 98.6 F (37 C) 98 F (36.7 C)  98.3 F (36.8 C)  TempSrc: Oral Oral  Oral  SpO2: 100% 95%  93%  Weight:   (!) 193.6 kg   Height:         Intake/Output Summary (Last 24 hours) at 01/31/2020 0738 Last data filed at 01/31/2020 3532 Gross per 24 hour  Intake 639.36 ml  Output 3050 ml  Net -2410.64 ml      PHYSICAL EXAM  General: Well developed, well nourished, in no acute distress HEENT:  Normocephalic and atramatic Neck:  No JVD.  Lungs: Clear bilaterally to auscultation and percussion. Heart: HRRR . Normal S1 and S2 without gallops or murmurs.  Abdomen: Bowel sounds are positive, abdomen soft and non-tender  Msk:  Back normal, normal gait. Normal strength and tone for age. Extremities: No clubbing, cyanosis or edema.   Neuro: Alert and oriented X 3. Psych:  Good affect, responds appropriately   LABS: Basic Metabolic Panel: Recent Labs    01/30/20 0524 01/31/20 0425  NA 136 134*  K 3.4* 3.5  CL 92* 90*  CO2 33* 34*  GLUCOSE 129* 123*  BUN 8 8  CREATININE 0.91 0.88  CALCIUM 7.6* 8.2*  MG  --  1.9   Liver Function Tests: No results for input(s): AST, ALT, ALKPHOS, BILITOT, PROT, ALBUMIN in the last 72 hours. No results for input(s): LIPASE, AMYLASE in the last 72 hours. CBC: Recent Labs    01/30/20 0524 01/31/20 0425  WBC 4.8 4.4  HGB 10.8* 12.1*  HCT 33.7* 38.0*  MCV 94.9 95.7  PLT 153 160   Cardiac Enzymes: No results for input(s): CKTOTAL, CKMB, CKMBINDEX, TROPONINI in the last 72 hours. BNP: Invalid input(s): POCBNP D-Dimer: No results for input(s): DDIMER in the last 72 hours. Hemoglobin A1C: No results for input(s): HGBA1C in the last 72 hours. Fasting Lipid Panel: No results for input(s):  CHOL, HDL, LDLCALC, TRIG, CHOLHDL, LDLDIRECT in the last 72 hours. Thyroid Function Tests: No results for input(s): TSH, T4TOTAL, T3FREE, THYROIDAB in the last 72 hours.  Invalid input(s): FREET3 Anemia Panel: No results for input(s): VITAMINB12, FOLATE, FERRITIN, TIBC, IRON, RETICCTPCT in the last 72 hours.  No results found.   Echo LVEF 55 to 60% by echo 12/24/2019  TELEMETRY: Atrial fibrillation 80 bpm:  ASSESSMENT AND PLAN:  Principal Problem:   Acute on chronic respiratory failure with hypoxia (HCC) Active Problems:   Acute on chronic diastolic CHF (congestive heart failure) (HCC)   HTN (hypertension)   Morbid obesity with BMI of 60.0-69.9, adult (HCC)   History of pulmonary embolism (on Coumadin)   Atrial fibrillation with RVR (HCC)   CAD (coronary artery disease)   Long term (current) use of anticoagulants   Alcohol withdrawal syndrome without complication (HCC)   CHF (congestive heart failure) (HCC)    1. Acute on chronic respiratory failure with hypoxia, multifactorial, secondary to COPD, obstructive sleep apnea, acute on chronic diastolic congestive heart failure, exacerbated by medical and dietary noncompliance, obesity, and daily alcohol abuse. Patient has a history of prior pulmonary embolus, subtherapeutic on warfarin. ChestCTdoes not reveal pulmonary embolus.  Net output -2.4 L 2.Acute on chronic diastolic congestive heart failure, likely noncompliant with diet,  BNP 279.8,on IV furosemide 60 mg BID 3.Atrial fibrillation with rapid ventricular rate, therapeutic on warfarin for stroke prevention, rate controlled. On amiodarone 200 mg BID and metoprolol succinate 100 mg daily. 4.Chest pain, atypical features, borderline elevated high-sensitivity troponin (62, 68, 91, 37)without delta, with nondiagnostic ECG 5.Alcohol withdrawal syndrome, with tremulousness and tachycardia,inpatient with daily alcohol use 6. History of pulmonary embolus,therapeutic on  warfarin,noevidence of current PE on chest CT  Recommendations  1.  Continue diuresis 2.  Carefully monitor renal status 3.  Continue metoprolol succinate 100 mg daily, amiodarone 200 mg twice daily for rate and rhythm control 4.  Continue warfarin, target INR 2-3 5.  Follow-up with Congestive Heart Failure clinic and Dr. Juliann Pares in 1 to 2 weeks following discharge   [name] Millard, MD, PhD, [facility] 01/31/2020 7:38 AM

## 2020-01-31 NOTE — Progress Notes (Signed)
ANTICOAGULATION CONSULT NOTE  Pharmacy Consult for Warfarin with Lovenox Bridge Indication: Afib and PE  No Known Allergies  Patient Measurements: Height: 5\' 8"  (172.7 cm) Weight: (!) 193.6 kg (426 lb 12.8 oz) IBW/kg (Calculated) : 68.4  Vital Signs: Temp: 98.1 F (36.7 C) (12/16 1213) Temp Source: Oral (12/16 1213) BP: 121/90 (12/16 1213) Pulse Rate: 71 (12/16 1213)  Labs: Recent Labs    01/29/20 0442 01/30/20 0524 01/31/20 0425  HGB 11.0* 10.8* 12.1*  HCT 34.1* 33.7* 38.0*  PLT 160 153 160  LABPROT 17.0* 22.2* 24.6*  INR 1.4* 2.0* 2.3*  CREATININE 0.93 0.91 0.88    Estimated Creatinine Clearance: 151.5 mL/min (by C-G formula based on SCr of 0.88 mg/dL).  Medical History: Past Medical History:  Diagnosis Date  . Allergy   . Anxiety   . Arthritis   . Asthma   . Brain damage   . Chronic pain of both knees 07/13/2018  . Clotting disorder (HCC)   . COPD (chronic obstructive pulmonary disease) (HCC)   . Depression   . GERD (gastroesophageal reflux disease)   . HFrEF (heart failure with reduced ejection fraction) (HCC)    a. 03/2018 Echo: EF 25-30%, diff HK. Mod LAE.  04/2018 History of DVT (deep vein thrombosis)   . History of pulmonary embolism    a. Chronic coumadin.  [name] Hypertension   . MI (myocardial infarction) (HCC)   . Morbid obesity (HCC)   . Neck pain 07/13/2018  . NICM (nonischemic cardiomyopathy) (HCC)    a. s/p Cath x 3 - reportedly nl cors. Last cath 2019 in [place]; b. a. 03/2018 Echo: EF 25-30%, diff HK.  04/2018 Persistent atrial fibrillation (HCC)    a. 03/2018 s/p DCCV; b. CHA2DS2VASc = 1-->Xarelto (later changed to warfarin); c. 05/2018 recurrent afib-->Amio initiated.  . Sleep apnea   . Sleep apnea    Medications:  PTA - Coumadin 5mg  daily, pt reports last dose pta 5mg  12/11 PTA - Amiodarone 200 mg twice daily - DDI may increase INR  Ceftriaxone 2 g q24h -  DDI may increase INR   Assessment: 59 year old male presenting with shortness of breath. PMH  includes deafness in left ear, morbid obesity, COPD, grade 1 diastolic dysfunction, HTN, hypothyroidism, anxiety/depression, obstructive sleep apnea endorses compliance on CPAP, hyperlipidemia, alcohol abuse, chronic atrial fibrillation and pulmonary emboli on warfarin. Reported warfarin dose prior to admission 5mg  daily. Pt currently on lovenox and warfarin for bridging therapy.   Hgb 10.8 (stable), plt 282>146>99>296>153   Date INR Warfarin dose   5mg  (last dose pta per report) 12/12  1.2 7.5mg  12/13 1.2 10mg  12/14 1.4 12mg  12/15 2.0 5mg  Therapeutic x 1 12/16 2.3 5mg  Therapeutic x 2, enoxaparin discontinued   Goal of Therapy:  INR Goal 2-3  Plan:  - Discontinue Lovenox 1 mg/kg Q12hr.  - Ordered Warfarin 5mg  at 1600 today.   - Check daily INR    - Will continue to monitor SCr, H&H, and Platelets.    , PharmD Pharmacy Resident  01/31/2020 12:31 PM

## 2020-01-31 NOTE — Progress Notes (Signed)
PROGRESS NOTE    [name] [name]   GTX:[id number]  DOB: 1960-03-24  PCP: [name] Cords, DO    DOA: 01/27/2020 LOS: 4   Brief Narrative   From 12/14 note by Dr. Ashok Pall: "[name] [name] is a 59 y.o. male with medical history significant for diastolic CHF, COPD on home O2 at 3 L, OSA on CPAP, morbid obesity, severe alcohol use disorder, chronic A. fib, history of PE on Coumadin, who presents to the emergency room with sudden onset shortness of breath that woke him up from sleep about 2 hours prior to arrival in the ER.  He has associated retrosternal chest tightness of moderate intensity, nonradiating, with no aggravating or alleviating factors.  Has Associated nausea but without vomiting or diaphoresis.  Denies cough, fever or chills.  Denies abdominal pain vomiting and diarrhea While in the emergency room patient was noted to have a mild tremor on CIWA protocol was initiated.  He was treated with IV Lasix for CHF exacerbation.  Hospitalist consulted for admission."     Assessment & Plan   Principal Problem:   Acute on chronic respiratory failure with hypoxia (HCC) Active Problems:   Acute on chronic diastolic CHF (congestive heart failure) (HCC)   HTN (hypertension)   Morbid obesity with BMI of 60.0-69.9, adult (HCC)   History of pulmonary embolism (on Coumadin)   Atrial fibrillation with RVR (HCC)   CAD (coronary artery disease)   Long term (current) use of anticoagulants   Alcohol withdrawal syndrome without complication (HCC)   CHF (congestive heart failure) (HCC)   Acute on chronic respiratory failure with hypoxia secondary to acute on chronic diastolic CHF and A. fib with RVR -patient requires 3 L/min supplemental oxygen at baseline.  He presented with increased oxygen requirement up to 6 L/min to maintain O2 sats in the low 90s.  Also had acute dyspnea and increased work of breathing conversational dyspnea at the time of admission. --Supplemental oxygen as needed  to maintain O2 sat greater than 88%, wean as tolerated  Acute on chronic diastolic CHF - diuresing as below Net IO Since Admission: -6,175.53 mL [01/31/20 1852]  A. fib with RVR -POA, heart rate now controlled. --Continue Lasix 60 mg IV twice daily (takes oral 40 mg twice daily at home) --Cardiology consulted, appreciate recommendations --Continue metoprolol and amiodarone --Warfarin dosing per pharmacy, Daily INRs --Strict I/O's and daily weights --Monitor renal function electrolytes closely  Bilateral lower extremity venous stasis dermatitis and recurrent cellulitis -was started on vancomycin for concern of recurrent cellulitis.  Recently treated with Rocephin for same in November.  Does have prior history of MRSA positive culture in November.  Monitor clinically.   Patient been afebrile and without leukocytosis, but did report increase in the warmth and erythema prior to admission --WOC consult --Elevate legs and compression wraps  --Continue vancomycin for now  Alcohol abuse -patient reported last drink was 12/10, prior to that last drink was 3 weeks ago.  CIWA protocol is been discontinued.  Continue thiamine supplement.  Hypertension -now with soft BPs in the setting of diuresis.  Continue metoprolol, diuresis as above.  As needed hydralazine.  History of PE on Coumadin -patient previously had breakthrough PE while on NOAC, hence Coumadin.  Due to subtherapeutic INR on admission, therapeutic dose Lovenox was used to bridge. --Warfarin dosing per pharmacy.    Elevated troponin -likely demand ischemia in the setting of acute on chronic hypoxia.  Patient had brief episode of chest pain which is resolved, no ischemic  changes on EKG.  Continue aspirin and Lipitor.    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  Continue daily supplement.  OSA -on CPAP, ordered  Hypothyroidism -continue Synthroid  Type 2  diabetes -A1c 6.2% in September 2021.  Sliding scale NovoLog.  Monitor.  Insomnia -continue trazodone    Morbid obesity: Body mass index is 64.89 kg/m.  Significantly complicates overall care and prognosis.  Patient would significantly benefit from lifestyle modifications aimed at weight loss.  DVT prophylaxis: on warfarin   Diet:  Diet Orders (From admission, onward)    Start     Ordered   01/27/20 0436  Diet heart healthy/carb modified Room service appropriate? Yes; Fluid consistency: Thin  Diet effective now       Question Answer Comment  Diet-HS Snack? Nothing   Room service appropriate? Yes   Fluid consistency: Thin      01/27/20 0439            Code Status: Full Code    Subjective 01/31/20    Patient seen at bedside today.  He was sleeping with CPAP on, woke up easily and transferred to the chair.  Says muscle relaxer helping some but not lasting long enough.  Pain medicine also not lasting, has a hx of many traumatic injuries and surgeries with chronic pain.  Says he needs to be referred to a new pain clinic, requests social worker.   Disposition Plan & Communication   Status is: Inpatient  Remains inpatient appropriate because:IV treatments appropriate due to intensity of illness or inability to take PO   Dispo: The patient is from: Home              Anticipated d/c is to: Home              Anticipated d/c date is: 2 days              Patient currently is not medically stable to d/c.   Family Communication: None at bedside, will attempt to call   Consults, Procedures, Significant Events   Consultants:   Cardiology  Procedures:   None  Antimicrobials:  Anti-infectives (From admission, onward)   Start     Dose/Rate Route Frequency Ordered Stop   01/30/20 1500  vancomycin (VANCOREADY) IVPB 2000 mg/400 mL        2,000 mg 200 mL/hr over 120 Minutes Intravenous Every 24 hours 01/29/20 1353     01/29/20 1445  vancomycin (VANCOREADY) IVPB 2000 mg/400  mL       "Followed by" Linked Group Details   2,000 mg 200 mL/hr over 120 Minutes Intravenous  Once 01/29/20 1353 01/29/20 1642   01/29/20 1445  vancomycin (VANCOREADY) IVPB 500 mg/100 mL       "Followed by" Linked Group Details   500 mg 100 mL/hr over 60 Minutes Intravenous  Once 01/29/20 1353 01/29/20 1805   01/29/20 1430  vancomycin (VANCOREADY) IVPB 2000 mg/400 mL  Status:  Discontinued        2,000 mg 200 mL/hr over 120 Minutes Intravenous Every 24 hours 01/29/20 1342 01/29/20 1350   01/29/20 1415  vancomycin (VANCOCIN) IVPB 1000 mg/200 mL premix  Status:  Discontinued        1,000 mg 200 mL/hr over 60 Minutes Intravenous  Once 01/29/20 1326 01/29/20 1327   01/28/20 1130  cefTRIAXone (ROCEPHIN) 2 g in sodium chloride 0.9 % 100 mL IVPB  Status:  Discontinued  2 g 200 mL/hr over 30 Minutes Intravenous Every 24 hours 01/28/20 1119 01/29/20 1326   01/28/20 1030  cefTRIAXone (ROCEPHIN) 2 g in sodium chloride 0.9 % 100 mL IVPB  Status:  Discontinued        2 g 200 mL/hr over 30 Minutes Intravenous Every 12 hours 01/28/20 1028 01/28/20 1104        Objective   Vitals:   01/31/20 0414 01/31/20 0748 01/31/20 1213 01/31/20 1637  BP: (!) 141/68 126/76 121/90 116/72  Pulse: 66 63 71 62  Resp: 19 18 18 18   Temp: 98.3 F (36.8 C) 97.7 F (36.5 C) 98.1 F (36.7 C) 98.2 F (36.8 C)  TempSrc: Oral Oral Oral Oral  SpO2: 93% 98% 100% 100%  Weight:      Height:        Intake/Output Summary (Last 24 hours) at 01/31/2020 1846 Last data filed at 01/31/2020 1615 Gross per 24 hour  Intake 800 ml  Output 3350 ml  Net -2550 ml   Filed Weights   01/29/20 0432 01/30/20 0322 01/31/20 0211  Weight: (!) 191.1 kg (!) 193.1 kg (!) 193.6 kg    Physical Exam:  General exam: Sleeping comfortably, then got up to chair, no acute distress, morbidly obese HEENT: On CPAP which he removed, hearing grossly normal  Respiratory system: CTAB but diminished due to body habitus, normal respiratory  effort with mild exertional dyspnea Cardiovascular system: normal S1/S2, RRR Central nervous system: no gross focal neurologic deficits, normal speech Extremities: Bilateral lower extremities are wrapped, antalgic gait with transferring bed to chair  Labs   Data Reviewed: I have personally reviewed following labs and imaging studies  CBC: Recent Labs  Lab 01/27/20 0319 01/29/20 0442 01/30/20 0524 01/31/20 0425  WBC 8.4 5.1 4.8 4.4  NEUTROABS 5.9  --   --   --   HGB 12.0* 11.0* 10.8* 12.1*  HCT 37.8* 34.1* 33.7* 38.0*  MCV 92.4 93.9 94.9 95.7  PLT 296 160 153 160   Basic Metabolic Panel: Recent Labs  Lab 01/27/20 0319 01/27/20 0454 01/28/20 0442 01/29/20 0442 01/30/20 0524 01/31/20 0425  NA 136  --  135 135 136 134*  K 3.7  --  3.6 3.6 3.4* 3.5  CL 94*  --  90* 91* 92* 90*  CO2 25  --  34* 32 33* 34*  GLUCOSE 172*  --  123* 121* 129* 123*  BUN 6  --  8 8 8 8   CREATININE 0.94  --  0.95 0.93 0.91 0.88  CALCIUM 7.8*  --  7.7* 7.6* 7.6* 8.2*  MG  --  1.5*  --   --   --  1.9  PHOS  --  2.3*  --   --   --   --    GFR: Estimated Creatinine Clearance: 151.5 mL/min (by C-G formula based on SCr of 0.88 mg/dL). Liver Function Tests: Recent Labs  Lab 01/27/20 0319  AST 57*  ALT 31  ALKPHOS 107  BILITOT 1.0  PROT 7.2  ALBUMIN 3.3*   No results for input(s): LIPASE, AMYLASE in the last 168 hours. No results for input(s): AMMONIA in the last 168 hours. Coagulation Profile: Recent Labs  Lab 01/27/20 0319 01/28/20 0442 01/29/20 0442 01/30/20 0524 01/31/20 0425  INR 1.2 1.2 1.4* 2.0* 2.3*   Cardiac Enzymes: No results for input(s): CKTOTAL, CKMB, CKMBINDEX, TROPONINI in the last 168 hours. BNP (last 3 results) No results for input(s): PROBNP in the last 8760 hours. HbA1C: No  results for input(s): HGBA1C in the last 72 hours. CBG: Recent Labs  Lab 01/30/20 1644 01/30/20 2104 01/31/20 0751 01/31/20 1213 01/31/20 1632  GLUCAP 133* 129* 136* 115* 141*    Lipid Profile: No results for input(s): CHOL, HDL, LDLCALC, TRIG, CHOLHDL, LDLDIRECT in the last 72 hours. Thyroid Function Tests: No results for input(s): TSH, T4TOTAL, FREET4, T3FREE, THYROIDAB in the last 72 hours. Anemia Panel: No results for input(s): VITAMINB12, FOLATE, FERRITIN, TIBC, IRON, RETICCTPCT in the last 72 hours. Sepsis Labs: No results for input(s): PROCALCITON, LATICACIDVEN in the last 168 hours.  Recent Results (from the past 240 hour(s))  Resp Panel by RT-PCR (Flu A&B, Covid) Nasopharyngeal Swab     Status: None   Collection Time: 01/27/20  3:19 AM   Specimen: Nasopharyngeal Swab; Nasopharyngeal(NP) swabs in vial transport medium  Result Value Ref Range Status   SARS Coronavirus 2 by RT PCR NEGATIVE NEGATIVE Final    Comment: (NOTE) SARS-CoV-2 target nucleic acids are NOT DETECTED.  The SARS-CoV-2 RNA is generally detectable in upper respiratory specimens during the acute phase of infection. The lowest concentration of SARS-CoV-2 viral copies this assay can detect is 138 copies/mL. A negative result does not preclude SARS-Cov-2 infection and should not be used as the sole basis for treatment or other patient management decisions. A negative result may occur with  improper specimen collection/handling, submission of specimen other than nasopharyngeal swab, presence of viral mutation(s) within the areas targeted by this assay, and inadequate number of viral copies(<138 copies/mL). A negative result must be combined with clinical observations, patient history, and epidemiological information. The expected result is Negative.  Fact Sheet for Patients:  BloggerCourse.com  Fact Sheet for Healthcare Providers:  SeriousBroker.it  This test is no t yet approved or cleared by the [place] FDA and  has been authorized for detection and/or diagnosis of SARS-CoV-2 by FDA under an Emergency Use Authorization  (EUA). This EUA will remain  in effect (meaning this test can be used) for the duration of the COVID-19 declaration under Section 564(b)(1) of the Act, 21 U.S.C.section 360bbb-3(b)(1), unless the authorization is terminated  or revoked sooner.       Influenza A by PCR NEGATIVE NEGATIVE Final   Influenza B by PCR NEGATIVE NEGATIVE Final    Comment: (NOTE) The Xpert Xpress SARS-CoV-2/FLU/RSV plus assay is intended as an aid in the diagnosis of influenza from Nasopharyngeal swab specimens and should not be used as a sole basis for treatment. Nasal washings and aspirates are unacceptable for Xpert Xpress SARS-CoV-2/FLU/RSV testing.  Fact Sheet for Patients: BloggerCourse.com  Fact Sheet for Healthcare Providers: SeriousBroker.it  This test is not yet approved or cleared by the [place] FDA and has been authorized for detection and/or diagnosis of SARS-CoV-2 by FDA under an Emergency Use Authorization (EUA). This EUA will remain in effect (meaning this test can be used) for the duration of the COVID-19 declaration under Section 564(b)(1) of the Act, 21 U.S.C. section 360bbb-3(b)(1), unless the authorization is terminated or revoked.  Performed at [facility], [address] [place], [place] [address]   MRSA PCR Screening     Status: None   Collection Time: 01/31/20  2:44 PM   Specimen: Nasopharyngeal  Result Value Ref Range Status   MRSA by PCR NEGATIVE NEGATIVE Final    Comment:        The GeneXpert MRSA Assay (FDA approved for NASAL specimens only), is one component of a comprehensive MRSA colonization surveillance program. It is  not intended to diagnose MRSA infection nor to guide or monitor treatment for MRSA infections. Performed at [facility], [address], [place], [place] [address]       Imaging Studies   No results found.   Medications   Scheduled Meds: . amiodarone  200  mg Oral BID  . aspirin  81 mg Oral Daily  . atorvastatin  40 mg Oral Daily  . DULoxetine  20 mg Oral Daily  . ferrous sulfate  325 mg Oral BID  . fluticasone  2 spray Each Nare BID  . folic acid  1 mg Oral Daily  . furosemide  60 mg Intravenous Q12H  . insulin aspart  0-15 Units Subcutaneous TID WC  . insulin aspart  0-5 Units Subcutaneous QHS  . levothyroxine  75 mcg Oral Q0600  . magnesium oxide  400 mg Oral BID  . methocarbamol  500 mg Oral QID  . metoprolol succinate  100 mg Oral Daily  . montelukast  10 mg Oral QHS  . multivitamin with minerals  1 tablet Oral Daily  . potassium chloride  40 mEq Oral Daily  . sodium chloride flush  3 mL Intravenous Q12H  . thiamine  100 mg Oral Daily   Or  . thiamine  100 mg Intravenous Daily  . traZODone  150 mg Oral QHS  . Warfarin - Pharmacist Dosing Inpatient   Does not apply q1600   Continuous Infusions: . sodium chloride    . vancomycin 2,000 mg (01/31/20 1447)       LOS: 4 days    Time spent: 25 minutes with greater than 50% spent at bedside and in coordination of care    Pennie Banter, DO [facility]  01/31/2020, 6:46 PM    If 7PM-7AM, please contact night-coverage. How to contact the [facility] Attending or Consulting provider 7A - 7P or covering provider during after hours 7P -7A, for this patient?    1. Check the care team in [facility] and look for a) attending/consulting [facility] provider listed and b) the [facility] team listed 2. Log into [email or web address] and use [place]'s universal password to access. If you do not have the password, please contact the hospital operator. 3. Locate the [facility] provider you are looking for under [facility] and page to a number that you can be directly reached. 4. If you still have difficulty reaching the provider, please page the [facility] (Director on Call) for the Hospitalists listed on amion for assistance.

## 2020-02-01 DIAGNOSIS — Z7901 Long term (current) use of anticoagulants: Secondary | ICD-10-CM

## 2020-02-01 LAB — CBC
HCT: 39.3 % (ref 39.0–52.0)
Hemoglobin: 12.5 g/dL — ABNORMAL LOW (ref 13.0–17.0)
MCH: 30.3 pg (ref 26.0–34.0)
MCHC: 31.8 g/dL (ref 30.0–36.0)
MCV: 95.2 fL (ref 80.0–100.0)
Platelets: 175 10*3/uL (ref 150–400)
RBC: 4.13 MIL/uL — ABNORMAL LOW (ref 4.22–5.81)
RDW: 17.3 % — ABNORMAL HIGH (ref 11.5–15.5)
WBC: 5.5 10*3/uL (ref 4.0–10.5)
nRBC: 0 % (ref 0.0–0.2)

## 2020-02-01 LAB — BASIC METABOLIC PANEL
Anion gap: 11 (ref 5–15)
BUN: 7 mg/dL (ref 6–20)
CO2: 34 mmol/L — ABNORMAL HIGH (ref 22–32)
Calcium: 9.1 mg/dL (ref 8.9–10.3)
Chloride: 90 mmol/L — ABNORMAL LOW (ref 98–111)
Creatinine, Ser: 0.75 mg/dL (ref 0.61–1.24)
GFR, Estimated: >60 mL/min (ref >60)
Glucose, Bld: 153 mg/dL — ABNORMAL HIGH (ref 70–99)
Potassium: 3.5 mmol/L (ref 3.5–5.1)
Sodium: 135 mmol/L (ref 135–145)

## 2020-02-01 LAB — PROTIME-INR
INR: 2.4 — ABNORMAL HIGH (ref 0.8–1.2)
Prothrombin Time: 25.3 seconds — ABNORMAL HIGH (ref 11.4–15.2)

## 2020-02-01 LAB — BRAIN NATRIURETIC PEPTIDE: B Natriuretic Peptide: 221.2 pg/mL — ABNORMAL HIGH (ref 0.0–100.0)

## 2020-02-01 LAB — GLUCOSE, CAPILLARY
Glucose-Capillary: 140 mg/dL — ABNORMAL HIGH (ref 70–99)
Glucose-Capillary: 117 mg/dL — ABNORMAL HIGH (ref 70–99)
Glucose-Capillary: 187 mg/dL — ABNORMAL HIGH (ref 70–99)
Glucose-Capillary: 132 mg/dL — ABNORMAL HIGH (ref 70–99)

## 2020-02-01 MED ORDER — POLYETHYLENE GLYCOL 3350 17 G PO PACK
17.0000 g | PACK | Freq: Every day | ORAL | Status: DC
  Filled 2020-02-01: qty 1

## 2020-02-01 MED ORDER — CEPHALEXIN 500 MG PO CAPS
500.0000 mg | ORAL_CAPSULE | Freq: Four times a day (QID) | ORAL | Status: AC
  Administered 2020-02-01 – 2020-02-02 (×4): 500 mg via ORAL
  Filled 2020-02-01 (×4): qty 1

## 2020-02-01 MED ORDER — SENNA 8.6 MG PO TABS: 1.0000 | ORAL_TABLET | Freq: Every day | ORAL | Status: DC | PRN

## 2020-02-01 MED ORDER — CARISOPRODOL 350 MG PO TABS
350.0000 mg | ORAL_TABLET | Freq: Three times a day (TID) | ORAL | Status: DC
  Administered 2020-02-01 – 2020-02-02 (×3): 350 mg via ORAL
  Filled 2020-02-01 (×3): qty 1

## 2020-02-01 MED ORDER — DULOXETINE HCL 20 MG PO CPEP
40.0000 mg | ORAL_CAPSULE | Freq: Every day | ORAL | Status: DC
  Administered 2020-02-02: 10:00:00 40 mg via ORAL
  Filled 2020-02-01: qty 2

## 2020-02-01 MED ORDER — DOXYCYCLINE HYCLATE 100 MG PO TABS
100.0000 mg | ORAL_TABLET | Freq: Two times a day (BID) | ORAL | Status: DC
  Administered 2020-02-01 – 2020-02-02 (×3): 100 mg via ORAL
  Filled 2020-02-01 (×3): qty 1

## 2020-02-01 MED ORDER — WARFARIN SODIUM 5 MG PO TABS
5.0000 mg | ORAL_TABLET | Freq: Once | ORAL | Status: AC
  Administered 2020-02-01: 18:00:00 5 mg via ORAL
  Filled 2020-02-01: qty 1

## 2020-02-01 NOTE — Progress Notes (Signed)
PROGRESS NOTE    [name] [name]   WUJ:[id number]  DOB: 10/12/1960  PCP: [name], DO    DOA: 01/27/2020 LOS: 5   Brief Narrative   From 12/14 note by Dr. Ashok Pall: "[name] [name] is a 59 y.o. male with medical history significant for diastolic CHF, COPD on home O2 at 3 L, OSA on CPAP, morbid obesity, severe alcohol use disorder, chronic A. fib, history of PE on Coumadin, who presents to the emergency room with sudden onset shortness of breath that woke him up from sleep about 2 hours prior to arrival in the ER.  He has associated retrosternal chest tightness of moderate intensity, nonradiating, with no aggravating or alleviating factors.  Has Associated nausea but without vomiting or diaphoresis.  Denies cough, fever or chills.  Denies abdominal pain vomiting and diarrhea While in the emergency room patient was noted to have a mild tremor on CIWA protocol was initiated.  He was treated with IV Lasix for CHF exacerbation.  Hospitalist consulted for admission."     Assessment & Plan   Principal Problem:   Acute on chronic respiratory failure with hypoxia (HCC) Active Problems:   Acute on chronic diastolic CHF (congestive heart failure) (HCC)   HTN (hypertension)   Morbid obesity with BMI of 60.0-69.9, adult (HCC)   History of pulmonary embolism (on Coumadin)   Atrial fibrillation with RVR (HCC)   CAD (coronary artery disease)   Long term (current) use of anticoagulants   Alcohol withdrawal syndrome without complication (HCC)   CHF (congestive heart failure) (HCC)   Acute on chronic respiratory failure with hypoxia secondary to acute on chronic diastolic CHF and A. fib with RVR -patient requires 3 L/min supplemental oxygen at baseline.  He presented with increased oxygen requirement up to 6 L/min to maintain O2 sats in the low 90s.  Also had acute dyspnea and increased work of breathing conversational dyspnea at the time of admission. --Supplemental oxygen as needed  to maintain O2 sat greater than 88%, wean as tolerated  Acute on chronic diastolic CHF - diuresing as below Net IO Since Admission: -9,390.53 mL [02/01/20 1657]  A. fib with RVR -POA, heart rate now controlled. --Continue Lasix 60 mg IV twice daily (takes oral 40 mg twice daily at home) --Cardiology consulted, appreciate recommendations --Continue metoprolol and amiodarone --Warfarin dosing per pharmacy, Daily INRs --Strict I/O's and daily weights --Monitor renal function electrolytes closely  Bilateral lower extremity venous stasis dermatitis and recurrent cellulitis -was started on vancomycin for concern of recurrent cellulitis.  Recently treated with Rocephin for same in November.  Does have prior history of MRSA positive culture in November.  Monitor clinically.   Patient been afebrile and without leukocytosis, but did report increase in the warmth and erythema prior to admission --WOC consult --Elevate legs and compression wraps  --Stop vancomycin --Transition to oral doxy and Keflex  Neck and shoulder pain -improved with Soma more so than Robaxin.  Scheduled Soma as ordered.  Heat packs as needed per patient preference.  Alcohol abuse -patient reported last drink was 12/10, prior to that last drink was 3 weeks ago.  CIWA protocol is been discontinued.  Continue thiamine supplement.  Hypertension -now with soft BPs in the setting of diuresis.  Continue metoprolol, diuresis as above.  As needed hydralazine.  History of PE on Coumadin -patient previously had breakthrough PE while on NOAC, hence Coumadin.  Due to subtherapeutic INR on admission, therapeutic dose Lovenox was used to bridge. --Warfarin dosing per pharmacy.  Elevated troponin -likely demand ischemia in the setting of acute on chronic hypoxia.  Patient had brief episode of chest pain which is resolved, no ischemic changes on EKG.  Continue aspirin and Lipitor.    Chronic pain -continue home Cymbalta, as needed  hydromorphone  Chronic hypokalemia -likely due to chronic diuretic use.  Continue daily supplement.  OSA -on CPAP, ordered  Hypothyroidism -continue Synthroid  Type 2 diabetes -A1c 6.2% in September 2021.  Sliding scale NovoLog.  Monitor.  Insomnia -continue trazodone    Morbid obesity: Body mass index is 64.89 kg/m.  Significantly complicates overall care and prognosis.  Patient would significantly benefit from lifestyle modifications aimed at weight loss.  DVT prophylaxis: on warfarin   Diet:  Diet Orders (From admission, onward)    Start     Ordered   01/27/20 0436  Diet heart healthy/carb modified Room service appropriate? Yes; Fluid consistency: Thin  Diet effective now       Question Answer Comment  Diet-HS Snack? Nothing   Room service appropriate? Yes   Fluid consistency: Thin      01/27/20 0439            Code Status: Full Code    Subjective 02/01/20    Patient seen at bedside today, up in chair.  He reports ongoing neck and shoulder pain, states soma helps better than Robaxin.  States the back of his thighs have been irritated.  This started at home when he got a new office chair.  I had him stand up and do not see any open wounds but the back of his thighs are discolored and erythematous, appears consistent with early pressure injury.  Disposition Plan & Communication   Status is: Inpatient  Remains inpatient appropriate because:IV treatments appropriate due to intensity of illness or inability to take PO   Dispo: The patient is from: Home              Anticipated d/c is to: Home              Anticipated d/c date is: 2 days              Patient currently is not medically stable to d/c.   Family Communication: None at bedside, will attempt to call   Consults, Procedures, Significant Events   Consultants:   Cardiology  Procedures:   None  Antimicrobials:  Anti-infectives (From admission, onward)   Start     Dose/Rate Route Frequency  Ordered Stop   02/01/20 1200  cephALEXin (KEFLEX) capsule 500 mg        500 mg Oral Every 6 hours 02/01/20 0903 02/02/20 1159   02/01/20 1000  doxycycline (VIBRA-TABS) tablet 100 mg        100 mg Oral Every 12 hours 02/01/20 0902 02/05/20 0959   01/30/20 1500  vancomycin (VANCOREADY) IVPB 2000 mg/400 mL  Status:  Discontinued        2,000 mg 200 mL/hr over 120 Minutes Intravenous Every 24 hours 01/29/20 1353 02/01/20 0902   01/29/20 1445  vancomycin (VANCOREADY) IVPB 2000 mg/400 mL       "Followed by" Linked Group Details   2,000 mg 200 mL/hr over 120 Minutes Intravenous  Once 01/29/20 1353 01/29/20 1642   01/29/20 1445  vancomycin (VANCOREADY) IVPB 500 mg/100 mL       "Followed by" Linked Group Details   500 mg 100 mL/hr over 60 Minutes Intravenous  Once 01/29/20 1353 01/29/20 1805   01/29/20 1430  vancomycin (VANCOREADY) IVPB 2000 mg/400 mL  Status:  Discontinued        2,000 mg 200 mL/hr over 120 Minutes Intravenous Every 24 hours 01/29/20 1342 01/29/20 1350   01/29/20 1415  vancomycin (VANCOCIN) IVPB 1000 mg/200 mL premix  Status:  Discontinued        1,000 mg 200 mL/hr over 60 Minutes Intravenous  Once 01/29/20 1326 01/29/20 1327   01/28/20 1130  cefTRIAXone (ROCEPHIN) 2 g in sodium chloride 0.9 % 100 mL IVPB  Status:  Discontinued        2 g 200 mL/hr over 30 Minutes Intravenous Every 24 hours 01/28/20 1119 01/29/20 1326   01/28/20 1030  cefTRIAXone (ROCEPHIN) 2 g in sodium chloride 0.9 % 100 mL IVPB  Status:  Discontinued        2 g 200 mL/hr over 30 Minutes Intravenous Every 12 hours 01/28/20 1028 01/28/20 1104        Objective   Vitals:   02/01/20 0214 02/01/20 0419 02/01/20 0912 02/01/20 1138  BP:  140/74 137/89 123/88  Pulse:  82 82 70  Resp:  19 18 18   Temp:  97.9 F (36.6 C) 97.7 F (36.5 C) 97.7 F (36.5 C)  TempSrc:  Oral    SpO2:  97% 98% 100%  Weight: (!) 193.6 kg     Height:        Intake/Output Summary (Last 24 hours) at 02/01/2020 1657 Last data  filed at 02/01/2020 1425 Gross per 24 hour  Intake 720 ml  Output 3935 ml  Net -3215 ml   Filed Weights   01/30/20 0322 01/31/20 0211 02/01/20 0214  Weight: (!) 193.1 kg (!) 193.6 kg (!) 193.6 kg    Physical Exam:  General exam: Awake, up in chair, no acute distress, morbidly obese Respiratory system: CTAB, diminished due to body habitus, normal respiratory effort at rest  Cardiovascular system: normal S1/S2, RRR Central nervous system: no gross focal neurologic deficits, normal speech Extremities: Bilateral lower extremities are wrapped, bilateral posterior thighs with erythema/discoloration but no visible open wounds.  Labs   Data Reviewed: I have personally reviewed following labs and imaging studies  CBC: Recent Labs  Lab 01/27/20 0319 01/29/20 0442 01/30/20 0524 01/31/20 0425 02/01/20 0612  WBC 8.4 5.1 4.8 4.4 5.5  NEUTROABS 5.9  --   --   --   --   HGB 12.0* 11.0* 10.8* 12.1* 12.5*  HCT 37.8* 34.1* 33.7* 38.0* 39.3  MCV 92.4 93.9 94.9 95.7 95.2  PLT 296 160 153 160 175   Basic Metabolic Panel: Recent Labs  Lab 01/27/20 0454 01/28/20 0442 01/29/20 0442 01/30/20 0524 01/31/20 0425 02/01/20 0612  NA  --  135 135 136 134* 135  K  --  3.6 3.6 3.4* 3.5 3.5  CL  --  90* 91* 92* 90* 90*  CO2  --  34* 32 33* 34* 34*  GLUCOSE  --  123* 121* 129* 123* 153*  BUN  --  8 8 8 8 7   CREATININE  --  0.95 0.93 0.91 0.88 0.75  CALCIUM  --  7.7* 7.6* 7.6* 8.2* 9.1  MG 1.5*  --   --   --  1.9  --   PHOS 2.3*  --   --   --   --   --    GFR: Estimated Creatinine Clearance: 166.6 mL/min (by C-G formula based on SCr of 0.75 mg/dL). Liver Function Tests: Recent Labs  Lab 01/27/20 0319  AST 57*  ALT 31  ALKPHOS 107  BILITOT 1.0  PROT 7.2  ALBUMIN 3.3*   No results for input(s): LIPASE, AMYLASE in the last 168 hours. No results for input(s): AMMONIA in the last 168 hours. Coagulation Profile: Recent Labs  Lab 01/28/20 0442 01/29/20 0442 01/30/20 0524  01/31/20 0425 02/01/20 0612  INR 1.2 1.4* 2.0* 2.3* 2.4*   Cardiac Enzymes: No results for input(s): CKTOTAL, CKMB, CKMBINDEX, TROPONINI in the last 168 hours. BNP (last 3 results) No results for input(s): PROBNP in the last 8760 hours. HbA1C: No results for input(s): HGBA1C in the last 72 hours. CBG: Recent Labs  Lab 01/31/20 1213 01/31/20 1632 01/31/20 2037 02/01/20 0909 02/01/20 1137  GLUCAP 115* 141* 135* 140* 117*   Lipid Profile: No results for input(s): CHOL, HDL, LDLCALC, TRIG, CHOLHDL, LDLDIRECT in the last 72 hours. Thyroid Function Tests: No results for input(s): TSH, T4TOTAL, FREET4, T3FREE, THYROIDAB in the last 72 hours. Anemia Panel: No results for input(s): VITAMINB12, FOLATE, FERRITIN, TIBC, IRON, RETICCTPCT in the last 72 hours. Sepsis Labs: No results for input(s): PROCALCITON, LATICACIDVEN in the last 168 hours.  Recent Results (from the past 240 hour(s))  Resp Panel by RT-PCR (Flu A&B, Covid) Nasopharyngeal Swab     Status: None   Collection Time: 01/27/20  3:19 AM   Specimen: Nasopharyngeal Swab; Nasopharyngeal(NP) swabs in vial transport medium  Result Value Ref Range Status   SARS Coronavirus 2 by RT PCR NEGATIVE NEGATIVE Final    Comment: (NOTE) SARS-CoV-2 target nucleic acids are NOT DETECTED.  The SARS-CoV-2 RNA is generally detectable in upper respiratory specimens during the acute phase of infection. The lowest concentration of SARS-CoV-2 viral copies this assay can detect is 138 copies/mL. A negative result does not preclude SARS-Cov-2 infection and should not be used as the sole basis for treatment or other patient management decisions. A negative result may occur with  improper specimen collection/handling, submission of specimen other than nasopharyngeal swab, presence of viral mutation(s) within the areas targeted by this assay, and inadequate number of viral copies(<138 copies/mL). A negative result must be combined with clinical  observations, patient history, and epidemiological information. The expected result is Negative.  Fact Sheet for Patients:  BloggerCourse.com  Fact Sheet for Healthcare Providers:  SeriousBroker.it  This test is no t yet approved or cleared by the [place] FDA and  has been authorized for detection and/or diagnosis of SARS-CoV-2 by FDA under an Emergency Use Authorization (EUA). This EUA will remain  in effect (meaning this test can be used) for the duration of the COVID-19 declaration under Section 564(b)(1) of the Act, 21 U.S.C.section 360bbb-3(b)(1), unless the authorization is terminated  or revoked sooner.       Influenza A by PCR NEGATIVE NEGATIVE Final   Influenza B by PCR NEGATIVE NEGATIVE Final    Comment: (NOTE) The Xpert Xpress SARS-CoV-2/FLU/RSV plus assay is intended as an aid in the diagnosis of influenza from Nasopharyngeal swab specimens and should not be used as a sole basis for treatment. Nasal washings and aspirates are unacceptable for Xpert Xpress SARS-CoV-2/FLU/RSV testing.  Fact Sheet for Patients: BloggerCourse.com  Fact Sheet for Healthcare Providers: SeriousBroker.it  This test is not yet approved or cleared by the [place] FDA and has been authorized for detection and/or diagnosis of SARS-CoV-2 by FDA under an Emergency Use Authorization (EUA). This EUA will remain in effect (meaning this test can be used) for the duration of the COVID-19 declaration under Section 564(b)(1) of the Act, 21  U.S.C. section 360bbb-3(b)(1), unless the authorization is terminated or revoked.  Performed at [facility], [address] [place], [place] [address]   MRSA PCR Screening     Status: None   Collection Time: 01/31/20  2:44 PM   Specimen: Nasopharyngeal  Result Value Ref Range Status   MRSA by PCR NEGATIVE NEGATIVE Final    Comment:         The GeneXpert MRSA Assay (FDA approved for NASAL specimens only), is one component of a comprehensive MRSA colonization surveillance program. It is not intended to diagnose MRSA infection nor to guide or monitor treatment for MRSA infections. Performed at [facility], [address] [place], [place] [address]       Imaging Studies   No results found.   Medications   Scheduled Meds: . amiodarone  200 mg Oral BID  . aspirin  81 mg Oral Daily  . atorvastatin  40 mg Oral Daily  . carisoprodol  350 mg Oral TID  . cephALEXin  500 mg Oral Q6H  . doxycycline  100 mg Oral Q12H  . [START ON 02/02/2020] DULoxetine  40 mg Oral Daily  . ferrous sulfate  325 mg Oral BID  . fluticasone  2 spray Each Nare BID  . folic acid  1 mg Oral Daily  . furosemide  60 mg Intravenous Q12H  . insulin aspart  0-15 Units Subcutaneous TID WC  . insulin aspart  0-5 Units Subcutaneous QHS  . levothyroxine  75 mcg Oral Q0600  . magnesium oxide  400 mg Oral BID  . metoprolol succinate  100 mg Oral Daily  . montelukast  10 mg Oral QHS  . multivitamin with minerals  1 tablet Oral Daily  . polyethylene glycol  17 g Oral Daily  . potassium chloride  40 mEq Oral Daily  . sodium chloride flush  3 mL Intravenous Q12H  . thiamine  100 mg Oral Daily   Or  . thiamine  100 mg Intravenous Daily  . traZODone  150 mg Oral QHS  . warfarin  5 mg Oral ONCE-1600  . Warfarin - Pharmacist Dosing Inpatient   Does not apply q1600   Continuous Infusions: . sodium chloride         LOS: 5 days    Time spent: 25 minutes with greater than 50% spent at bedside and in coordination of care    Pennie Banter, DO [facility]  02/01/2020, 4:57 PM    If 7PM-7AM, please contact night-coverage. How to contact the [facility] Attending or Consulting provider 7A - 7P or covering provider during after hours 7P -7A, for this patient?    1. Check the care team in [facility] and look for a) attending/consulting  TRH provider listed and b) the [facility] team listed 2. Log into [email or web address] and use [place]'s universal password to access. If you do not have the password, please contact the hospital operator. 3. Locate the [facility] provider you are looking for under [facility] and page to a number that you can be directly reached. 4. If you still have difficulty reaching the provider, please page the [facility] (Director on Call) for the Hospitalists listed on amion for assistance.

## 2020-02-01 NOTE — Progress Notes (Signed)
ANTICOAGULATION CONSULT NOTE  Pharmacy Consult for Warfarin with Lovenox Bridge Indication: Afib and PE  No Known Allergies  Patient Measurements: Height: 5\' 8"  (172.7 cm) Weight: (!) 193.6 kg (426 lb 12.8 oz) IBW/kg (Calculated) : 68.4  Vital Signs: Temp: 97.9 F (36.6 C) (12/17 0419) Temp Source: Oral (12/17 0419) BP: 140/74 (12/17 0419) Pulse Rate: 82 (12/17 0419)  Labs: Recent Labs    01/30/20 0524 01/31/20 0425 02/01/20 0612  HGB 10.8* 12.1* 12.5*  HCT 33.7* 38.0* 39.3  PLT 153 160 175  LABPROT 22.2* 24.6* 25.3*  INR 2.0* 2.3* 2.4*  CREATININE 0.91 0.88 0.75    Estimated Creatinine Clearance: 166.6 mL/min (by C-G formula based on SCr of 0.75 mg/dL).  Medical History: Past Medical History:  Diagnosis Date  . Allergy   . Anxiety   . Arthritis   . Asthma   . Brain damage   . Chronic pain of both knees 07/13/2018  . Clotting disorder (HCC)   . COPD (chronic obstructive pulmonary disease) (HCC)   . Depression   . GERD (gastroesophageal reflux disease)   . HFrEF (heart failure with reduced ejection fraction) (HCC)    a. 03/2018 Echo: EF 25-30%, diff HK. Mod LAE.  04/2018 History of DVT (deep vein thrombosis)   . History of pulmonary embolism    a. Chronic coumadin.  [name] Hypertension   . MI (myocardial infarction) (HCC)   . Morbid obesity (HCC)   . Neck pain 07/13/2018  . NICM (nonischemic cardiomyopathy) (HCC)    a. s/p Cath x 3 - reportedly nl cors. Last cath 2019 in [place]; b. a. 03/2018 Echo: EF 25-30%, diff HK.  04/2018 Persistent atrial fibrillation (HCC)    a. 03/2018 s/p DCCV; b. CHA2DS2VASc = 1-->Xarelto (later changed to warfarin); c. 05/2018 recurrent afib-->Amio initiated.  . Sleep apnea   . Sleep apnea    Medications:  PTA - Coumadin 5mg  daily, pt reports last dose pta 5mg  12/11 PTA - Amiodarone 200 mg twice daily - DDI may increase INR  Ceftriaxone 2 g q24h -  DDI may increase INR   Assessment: 59 year old male presenting with shortness of breath. PMH includes  deafness in left ear, morbid obesity, COPD, grade 1 diastolic dysfunction, HTN, hypothyroidism, anxiety/depression, obstructive sleep apnea endorses compliance on CPAP, hyperlipidemia, alcohol abuse, chronic atrial fibrillation and pulmonary emboli on warfarin. Reported warfarin dose prior to admission 5mg  daily. Pt was on lovenox for bridging therapy - lovenox now discontinued for INR within goal range x 2 readings, stopped 12/16. Pharmacy will continue to dose warfarin and monitor daily INR/CBC.   Hgb 12.5, plt 175   Date INR Warfarin dose   5mg  (last dose pta per report) 12/12  1.2 7.5mg  12/13 1.2 10mg  12/14 1.4 12mg  12/15 2.0 5mg  Therapeutic x 1 12/16 2.3 5mg  Therapeutic x 2, enoxaparin discontinued  12/17 2.4 5mg  Therapeutic x 3  Goal of Therapy:  INR Goal 2-3  Plan:  - Ordered Warfarin 5mg  at 1600 today.   - Check daily INR    - Will continue to monitor SCr, H&H, and Platelets.    1/14, PharmD Pharmacy Resident  02/01/2020 8:33 AM

## 2020-02-01 NOTE — Progress Notes (Signed)
Patient Name: [name] [name] Date of Encounter: 02/01/2020  Hospital Problem List     Principal Problem:   Acute on chronic respiratory failure with hypoxia [facility]) Active Problems:   Acute on chronic diastolic CHF (congestive heart failure) (HCC)   HTN (hypertension)   Morbid obesity with BMI of 60.0-69.9, adult (HCC)   History of pulmonary embolism (on Coumadin)   Atrial fibrillation with RVR (HCC)   CAD (coronary artery disease)   Long term (current) use of anticoagulants   Alcohol withdrawal syndrome without complication (HCC)   CHF (congestive heart failure) (HCC)    Patient Profile     Patient with history of chronic respiratory failure, morbid obesity with a BMI of greater than 60, hypertension, chronic atrial fibrillation, history of pulmonary embolism on warfarin, history of coronary disease, history of HFpEF with ejection fraction of 55 to 60% was admitted with progressive shortness of breath.  He has been diuresed approximately 9 L.  He still complains of shortness of breath.  And neck pain.  Subjective   Neck pain/headache, shortness of breath  Inpatient Medications    . amiodarone  200 mg Oral BID  . aspirin  81 mg Oral Daily  . atorvastatin  40 mg Oral Daily  . DULoxetine  20 mg Oral Daily  . ferrous sulfate  325 mg Oral BID  . fluticasone  2 spray Each Nare BID  . folic acid  1 mg Oral Daily  . furosemide  60 mg Intravenous Q12H  . insulin aspart  0-15 Units Subcutaneous TID WC  . insulin aspart  0-5 Units Subcutaneous QHS  . levothyroxine  75 mcg Oral Q0600  . magnesium oxide  400 mg Oral BID  . methocarbamol  500 mg Oral QID  . metoprolol succinate  100 mg Oral Daily  . montelukast  10 mg Oral QHS  . multivitamin with minerals  1 tablet Oral Daily  . potassium chloride  40 mEq Oral Daily  . sodium chloride flush  3 mL Intravenous Q12H  . thiamine  100 mg Oral Daily   Or  . thiamine  100 mg Intravenous Daily  . traZODone  150 mg Oral QHS  .  Warfarin - Pharmacist Dosing Inpatient   Does not apply q1600    Vital Signs    Vitals:   01/31/20 1637 01/31/20 2042 02/01/20 0214 02/01/20 0419  BP: 116/72 115/70  140/74  Pulse: 62 80  82  Resp: 18 18  19   Temp: 98.2 F (36.8 C) 98.2 F (36.8 C)  97.9 F (36.6 C)  TempSrc: Oral Oral  Oral  SpO2: 100% 100%  97%  Weight:   (!) 193.6 kg   Height:        Intake/Output Summary (Last 24 hours) at 02/01/2020 0805 Last data filed at 02/01/2020 0546 Gross per 24 hour  Intake 800 ml  Output 4125 ml  Net -3325 ml   Filed Weights   01/30/20 0322 01/31/20 0211 02/01/20 0214  Weight: (!) 193.1 kg (!) 193.6 kg (!) 193.6 kg    Physical Exam    GEN: Morbidly obese HEENT: normal.  Neck: Supple, no JVD, carotid bruits, or masses. Cardiac: Irregular regular rhythm Unna boots on lower extremities. Respiratory:  Respirations regular and unlabored, clear to auscultation bilaterally. GI: Not able to assess due to body habitus. MS: no deformity or atrophy. Skin: warm and dry, no rash. Neuro:  Strength and sensation are intact. Psych: Normal affect.  Labs    CBC Recent  Labs    01/31/20 0425 02/01/20 0612  WBC 4.4 5.5  HGB 12.1* 12.5*  HCT 38.0* 39.3  MCV 95.7 95.2  PLT 160 175   Basic Metabolic Panel Recent Labs    74/25/95 0425 02/01/20 0612  NA 134* 135  K 3.5 3.5  CL 90* 90*  CO2 34* 34*  GLUCOSE 123* 153*  BUN 8 7  CREATININE 0.88 0.75  CALCIUM 8.2* 9.1  MG 1.9  --    Liver Function Tests No results for input(s): AST, ALT, ALKPHOS, BILITOT, PROT, ALBUMIN in the last 72 hours. No results for input(s): LIPASE, AMYLASE in the last 72 hours. Cardiac Enzymes No results for input(s): CKTOTAL, CKMB, CKMBINDEX, TROPONINI in the last 72 hours. BNP No results for input(s): BNP in the last 72 hours. D-Dimer No results for input(s): DDIMER in the last 72 hours. Hemoglobin A1C No results for input(s): HGBA1C in the last 72 hours. Fasting Lipid Panel No results for  input(s): CHOL, HDL, LDLCALC, TRIG, CHOLHDL, LDLDIRECT in the last 72 hours. Thyroid Function Tests No results for input(s): TSH, T4TOTAL, T3FREE, THYROIDAB in the last 72 hours.  Invalid input(s): FREET3  Telemetry    Atrial fibrillation with controlled ventricular response  ECG    Atrial fibrillation  Radiology    CT ANGIO CHEST PE W OR WO CONTRAST  Result Date: 01/27/2020 CLINICAL DATA:  PE suspected, abrupt onset dyspnea and AFib EXAM: CT ANGIOGRAPHY CHEST WITH CONTRAST TECHNIQUE: Multidetector CT imaging of the chest was performed using the standard protocol during bolus administration of intravenous contrast. Multiplanar CT image reconstructions and MIPs were obtained to evaluate the vascular anatomy. CONTRAST:  OMNIPAQUE IOHEXOL 350 MG/ML SOLN COMPARISON:  12/22/2019 FINDINGS: Cardiovascular: Examination for pulmonary embolism is somewhat limited by motion artifact. Within this limitation, no evidence of pulmonary embolism through the segmental pulmonary arterial level. Cardiomegaly. Left coronary artery calcifications. No pericardial effusion. Mediastinum/Nodes: No enlarged mediastinal, hilar, or axillary lymph nodes. Thyroid gland, trachea, and esophagus demonstrate no significant findings. Lungs/Pleura: Lungs are clear. No pleural effusion or pneumothorax. Upper Abdomen: No acute abnormality. Musculoskeletal: No chest wall abnormality. No acute or significant osseous findings. Review of the MIP images confirms the above findings. IMPRESSION: 1. Examination for pulmonary embolism is somewhat limited by motion artifact. Within this limitation, no evidence of pulmonary embolism through the segmental pulmonary arterial level. 2. Cardiomegaly and coronary artery disease. Electronically Signed   By: Lauralyn Primes M.D.   On: 01/27/2020 11:15   US Venous Img Lower Bilateral (DVT)  Result Date: 01/08/2020 CLINICAL DATA:  Bilateral lower extremity pain and edema. Evaluate for DVT. EXAM:  BILATERAL LOWER EXTREMITY VENOUS DOPPLER ULTRASOUND TECHNIQUE: Gray-scale sonography with graded compression, as well as color Doppler and duplex ultrasound were performed to evaluate the lower extremity deep venous systems from the level of the common femoral vein and including the common femoral, femoral, profunda femoral, popliteal and calf veins including the posterior tibial, peroneal and gastrocnemius veins when visible. The superficial great saphenous vein was also interrogated. Spectral Doppler was utilized to evaluate flow at rest and with distal augmentation maneuvers in the common femoral, femoral and popliteal veins. COMPARISON:  Bilateral lower extremity venous Doppler ultrasound-12/22/2019; 01/21/2019; 10/24/2018 (all negative); left lower extremity venous Doppler ultrasound-11/07/2019 (negative FINDINGS: Examination is degraded due to patient body habitus and poor sonographic window RIGHT LOWER EXTREMITY Common Femoral Vein: No evidence of thrombus. Normal compressibility, respiratory phasicity and response to augmentation. Saphenofemoral Junction: No evidence of thrombus. Normal compressibility and  flow on color Doppler imaging. Profunda Femoral Vein: No evidence of thrombus. Normal compressibility and flow on color Doppler imaging. Femoral Vein: No evidence of thrombus. Normal compressibility, respiratory phasicity and response to augmentation. Popliteal Vein: No evidence of thrombus. Normal compressibility, respiratory phasicity and response to augmentation. Calf Veins: Appear patent where visualized. Superficial Great Saphenous Vein: No evidence of thrombus. Normal compressibility. Venous Reflux:  None. Other Findings:  None. LEFT LOWER EXTREMITY Common Femoral Vein: No evidence of thrombus. Normal compressibility, respiratory phasicity and response to augmentation. Saphenofemoral Junction: No evidence of thrombus. Normal compressibility and flow on color Doppler imaging. Profunda Femoral Vein: No  evidence of thrombus. Normal compressibility and flow on color Doppler imaging. Femoral Vein: No evidence of thrombus. Normal compressibility, respiratory phasicity and response to augmentation. Popliteal Vein: No evidence of thrombus. Normal compressibility, respiratory phasicity and response to augmentation. Calf Veins: Appear patent where visualized. Superficial Great Saphenous Vein: No evidence of thrombus. Normal compressibility. Venous Reflux:  None. Other Findings:  None. IMPRESSION: No evidence of DVT within either lower extremity. Note, this is the 5th negative lower extremity venous Doppler ultrasound since 10/24/2018. Electronically Signed   By: Simonne Come M.D.   On: 01/08/2020 07:49   DG Chest Port 1 View  Result Date: 01/27/2020 CLINICAL DATA:  Initial evaluation for acute shortness of breath. EXAM: PORTABLE CHEST 1 VIEW COMPARISON:  Prior radiograph from 01/07/2020. FINDINGS: Cardiomegaly, stable.  Mediastinal silhouette within normal limits. Lungs mildly hypoinflated. Diffuse pulmonary vascular and interstitial congestion, suggesting mild pulmonary interstitial congestion/edema. No visible pleural effusion. No focal infiltrates. No pneumothorax. No acute osseous finding. IMPRESSION: Cardiomegaly with mild diffuse pulmonary interstitial congestion/edema. Electronically Signed   By: Rise Mu M.D.   On: 01/27/2020 04:03   DG Chest Portable 1 View  Result Date: 01/07/2020 CLINICAL DATA:  Respiratory distress, COPD exacerbation, asthma, hypertension EXAM: PORTABLE CHEST 1 VIEW COMPARISON:  Portable exam 1545 hours compared to 12/22/2019 FINDINGS: Enlargement of cardiac silhouette. Mediastinal contours and pulmonary vascularity normal. Lungs grossly clear. No definite infiltrate, pleural effusion, or pneumothorax. Osseous structures unremarkable. IMPRESSION: Enlargement of cardiac silhouette. No acute abnormalities. Electronically Signed   By: Ulyses Southward M.D.   On: 01/07/2020 16:00    US ABDOMEN LIMITED RUQ (LIVER/GB)  Result Date: 01/08/2020 CLINICAL DATA:  Right upper quadrant pain EXAM: ULTRASOUND ABDOMEN LIMITED RIGHT UPPER QUADRANT COMPARISON:  None. FINDINGS: Gallbladder: No gallstones or wall thickening visualized. No sonographic Murphy sign noted by sonographer. Common bile duct: Diameter: Normal caliber, 5 mm Liver: Increased echotexture compatible with fatty infiltration. 3.5 cm simple appearing cyst in the right hepatic lobe. Portal vein is patent on color Doppler imaging with normal direction of blood flow towards the liver. Other: None. IMPRESSION: Hepatic steatosis. Electronically Signed   By: Charlett Nose M.D.   On: 01/08/2020 10:44    Assessment & Plan     Principal Problem:   Acute on chronic respiratory failure with hypoxia (HCC) Active Problems:   Acute on chronic diastolic CHF (congestive heart failure) (HCC)   HTN (hypertension)   Morbid obesity with BMI of 60.0-69.9, adult (HCC)   History of pulmonary embolism (on Coumadin)   Atrial fibrillation with RVR (HCC)   CAD (coronary artery disease)   Long term (current) use of anticoagulants   Alcohol withdrawal syndrome without complication (HCC)   CHF (congestive heart failure) (HCC)    1. Acute on chronic respiratory failure with hypoxia, multifactorial, secondary to COPD, obstructive sleep apnea, acute on chronic diastolic congestive heart  failure, exacerbated by medical and dietary noncompliance, obesity, and daily alcohol abuse. Patient has a history of prior pulmonary embolus, subtherapeutic on warfarin. ChestCTdoes not reveal pulmonary embolus.  Net output -9L we will continue with IV Lasix and check BNP today. 2.Acute on chronic diastolic congestive heart failure,  noncompliant with diet as outpatient, states he is compliant with medications, BNP 279.8,onIVfurosemide60 mgBID.  As per above.  We will continue with IV Lasix and check BNP.  Will need close outpatient follow-up with daily  weights although the patient states he is not able to stand on the scale.  Can discuss consideration for cardio mems as an outpatient. 3.Atrial fibrillation with rapid ventricular rate, therapeutic on warfarin for stroke prevention, rate controlled. On amiodarone 200 mg BID and metoprolol succinate 100 mg daily. 4.Chest pain, atypical features, borderline elevated high-sensitivity troponin (62, 68, 91, 37)without delta, with nondiagnostic ECG 5.Alcohol withdrawal syndrome, with tremulousness and tachycardia,inpatient with daily alcohol use 6. History of pulmonary embolus,therapeutic on warfarin,noevidence of current PE on chest CT  Recommendations  1.  Continue diuresis with IV Lasix.  Will check BNP.  Low-sodium diet. 2.  Carefully monitor renal status, creatinine remains stable at 0.75 with a GFR of greater than 60. 3.  Continue metoprolol succinate 100 mg daily, amiodarone 200 mg twice daily for rate and rhythm control 4.  Continue warfarin, target INR 2-3 5.  Follow-up with Congestive Heart Failure clinic and Dr. Juliann Pares in 1 to 2 weeks following discharge.  Low-sodium diet.  Signed, Darlin Priestly Ronnita Paz MD 02/01/2020, 8:05 AM  Pager: [phone]

## 2020-02-01 NOTE — Progress Notes (Signed)
   Heart Failure Nurse Navigator Note  HFpEF 55-60%   He presented to the emergency room with complaints of sudden onset of shortness of breath that woke him from sleep.  He also complained of chest tightness and nausea.   Comorbidities:  Morbid obesity Chronic atrial fibrillation Coronary artery disease Hypertension Type 2 diabetes Obstructive sleep apnea with CPAP use.   History of alcohol abuse.    Medications:   Amiodarone 200 mg twice a day Aspirin 81 mg daily Lipitor 40 mg daily Furosemide 60 mg every 12 hours IV Metoprolol succinate 100 mg daily Potassium chloride 40 mEq daily   Labs:  Sodium 135, potassium 3.5, chloride 90, CO2 34, BUN 7, creatinine 0.75, hemoglobin 12.5, hematocrit 39.3, BNP pending at this time. Intake 800 mL Output 4375 mL Weight-193.6 kg, question if this is accurate is the same weight as yesterday and he put out over 4 L.  BMI is 64.89 Blood pressure 140/74    Assessment:   General-she is awake and alert sitting up in the chair at bedside, no acute distress but does complain of left neck muscle stiffness.  HEENT-pupils are equal, unable to evaluate for JVD due to body habitus.  Cardiac-heart tones are distant and irregular.  Chest-breath sounds with fine crackles in the bases.  Abdomen- obese, non tender.  Musculoskeletal-lower legs wrapped in [employer].  Neurologic-beaches clear moves all extremities.  Psych-is pleasant and appropriate makes eye contact.     Visited with patient today.  He is asking how he gets into trouble with being in heart failure. instructed he has normal function of the bottom part of his heart but the ventricle is stiff not allowing for proper filling. possibilities leading to exacerbation of heart failure is not following low-sodium diet, blood pressure is out of control etc. but he states that he eats very little and feels that what he eats is of low sodium content.  He knows when his blood  pressure is checked that is good.   Attempted to do REDS Cilp Vest reading on patient.  To correlate with the BNP being drawn today.  Discussed case with Dr. Lady Gary.  Do not feel that he would be compliant with the sitting scale which costs $500 but suggested insertion of CardioMEMS.  States that he will leave that decision up to his outpatient primary cardiologist who is Dr. Juliann Pares.    Tresa Endo RN, CHFN

## 2020-02-01 NOTE — TOC Progression Note (Signed)
Transition of Care [facility]) - Progression Note    Patient Details  Name: [name] [name] MRN: [id number] Date of Birth: 05-Dec-1960  Transition of Care [facility]) CM/SW Contact  Hetty Ely, RN Phone Number: 02/01/2020, 3:24 PM  Clinical Narrative: [facility] Personal Care Services, PCS form completed and faxed to [facility].      Expected Discharge Plan: Home w Home Health Services Barriers to Discharge: Continued Medical Work up  Expected Discharge Plan and Services Expected Discharge Plan: Home w Home Health Services In-house Referral: Clinical Social Work   Post Acute Care Choice: Home Health Living arrangements for the past 2 months: Single Family Home                 DME Arranged: Oxygen (O2 3L) DME Agency:  Electrical engineer)                   Social Determinants of Health (SDOH) Interventions    Readmission Risk Interventions Readmission Risk Prevention Plan 01/11/2020 12/24/2019 01/25/2019  Transportation Screening Complete Complete Complete  PCP or Specialist Appt within 3-5 Days - - -  HRI or Home Care Consult - - -  Social Work Consult for Recovery Care Planning/Counseling - - -  Palliative Care Screening - - -  Medication Review Oceanographer) Complete Complete Complete  PCP or Specialist appointment within 3-5 days of discharge Complete Complete Complete  HRI or Home Care Consult - - Complete  SW Recovery Care/Counseling Consult Complete Complete Complete  Palliative Care Screening Complete Not Applicable Not Applicable  Skilled Nursing Facility Not Applicable Not Applicable Not Applicable

## 2020-02-02 LAB — CBC
HCT: 33.7 % — ABNORMAL LOW (ref 39.0–52.0)
Hemoglobin: 10.7 g/dL — ABNORMAL LOW (ref 13.0–17.0)
MCH: 30.1 pg (ref 26.0–34.0)
MCHC: 31.8 g/dL (ref 30.0–36.0)
MCV: 94.9 fL (ref 80.0–100.0)
Platelets: 153 10*3/uL (ref 150–400)
RBC: 3.55 MIL/uL — ABNORMAL LOW (ref 4.22–5.81)
RDW: 17.4 % — ABNORMAL HIGH (ref 11.5–15.5)
WBC: 4.8 10*3/uL (ref 4.0–10.5)
nRBC: 0 % (ref 0.0–0.2)

## 2020-02-02 LAB — BASIC METABOLIC PANEL
Anion gap: 8 (ref 5–15)
BUN: 11 mg/dL (ref 6–20)
CO2: 36 mmol/L — ABNORMAL HIGH (ref 22–32)
Calcium: 9 mg/dL (ref 8.9–10.3)
Chloride: 93 mmol/L — ABNORMAL LOW (ref 98–111)
Creatinine, Ser: 0.78 mg/dL (ref 0.61–1.24)
GFR, Estimated: >60 mL/min (ref >60)
Glucose, Bld: 126 mg/dL — ABNORMAL HIGH (ref 70–99)
Potassium: 3.8 mmol/L (ref 3.5–5.1)
Sodium: 137 mmol/L (ref 135–145)

## 2020-02-02 LAB — GLUCOSE, CAPILLARY
Glucose-Capillary: 133 mg/dL — ABNORMAL HIGH (ref 70–99)
Glucose-Capillary: 118 mg/dL — ABNORMAL HIGH (ref 70–99)

## 2020-02-02 LAB — PROTIME-INR
INR: 1.9 — ABNORMAL HIGH (ref 0.8–1.2)
Prothrombin Time: 21.3 seconds — ABNORMAL HIGH (ref 11.4–15.2)

## 2020-02-02 MED ORDER — HYDROCODONE-ACETAMINOPHEN 7.5-325 MG PO TABS: 1.0000 | ORAL_TABLET | Freq: Four times a day (QID) | ORAL | 0 refills | Status: AC | PRN

## 2020-02-02 MED ORDER — CARISOPRODOL 350 MG PO TABS: 350.0000 mg | ORAL_TABLET | Freq: Three times a day (TID) | ORAL | 0 refills | Status: DC | PRN

## 2020-02-02 MED ORDER — TORSEMIDE 20 MG PO TABS: 80.0000 mg | ORAL_TABLET | Freq: Two times a day (BID) | ORAL | 1 refills | Status: DC

## 2020-02-02 MED ORDER — FUROSEMIDE 40 MG PO TABS: 40.0000 mg | ORAL_TABLET | Freq: Two times a day (BID) | ORAL | Status: DC

## 2020-02-02 MED ORDER — ASPIRIN 81 MG PO CHEW: 81.0000 mg | CHEWABLE_TABLET | Freq: Every day | ORAL | 1 refills | Status: DC

## 2020-02-02 MED ORDER — WARFARIN SODIUM 6 MG PO TABS
6.0000 mg | ORAL_TABLET | Freq: Once | ORAL | Status: DC
  Filled 2020-02-02: qty 1

## 2020-02-02 MED ORDER — TORSEMIDE 20 MG PO TABS: 80.0000 mg | ORAL_TABLET | Freq: Two times a day (BID) | ORAL | Status: DC

## 2020-02-02 NOTE — Discharge Summary (Signed)
Physician Discharge Summary  [name] [name] ZOX:[id number] DOB: Sep 15, 1960 DOA: 01/27/2020  PCP: [name], DO  Admit date: 01/27/2020 Discharge date: 02/02/2020  Admitted From: home Disposition:  home  Recommendations for Outpatient Follow-up:  1. Follow up with PCP in 1-2 weeks 2. Please obtain BMP/CBC in one week 3. Please follow up with cardiology in 1 week  Home Health: No  Equipment/Devices: None   Discharge Condition: Stable  CODE STATUS: Full  Diet recommendation: Heart Healthy / Carb modified   Discharge Diagnoses: Principal Problem:   Acute on chronic respiratory failure with hypoxia (HCC) Active Problems:   Acute on chronic diastolic CHF (congestive heart failure) (HCC)   HTN (hypertension)   Morbid obesity with BMI of 60.0-69.9, adult (HCC)   History of pulmonary embolism (on Coumadin)   Atrial fibrillation with RVR (HCC)   CAD (coronary artery disease)   Long term (current) use of anticoagulants   Alcohol withdrawal syndrome without complication (HCC)   CHF (congestive heart failure) (HCC)    Summary of HPI and Hospital Course:  From 12/14 note by Dr. Ashok Pall: "[name] [name] is a 59 y.o. male with medical history significant for diastolic CHF, COPD on home O2 at 3 L, OSA on CPAP, morbid obesity, severe alcohol use disorder, chronic A. fib, history of PE on Coumadin, who presents to the emergency room with sudden onset shortness of breath that woke him up from sleep about 2 hours prior to arrival in the ER.  He has associated retrosternal chest tightness of moderate intensity, nonradiating, with no aggravating or alleviating factors.  Has Associated nausea but without vomiting or diaphoresis.  Denies cough, fever or chills.  Denies abdominal pain vomiting and diarrhea While in the emergency room patient was noted to have a mild tremor on CIWA protocol was initiated.  He was treated with IV Lasix for CHF exacerbation.  Hospitalist consulted for  admission."     This AM, patient adamant to go home today.  States he's in pain, has gotten no sleep, nothing to eat that he likes, tired of [place] sandwiches.  Discussed with cardiology who felt patient near his baseline.  Recommended close follow up in 1 week and continued Unna boots on LE's    Acute on chronic respiratory failure with hypoxia secondary to acute on chronic diastolic CHF and A. fib with RVR -patient requires 3 L/min supplemental oxygen at baseline.  He presented with increased oxygen requirement up to 6 L/min to maintain O2 sats in the low 90s.  Also had acute dyspnea and increased work of breathing conversational dyspnea at the time of admission. --Supplemental oxygen as needed to maintain O2 sat greater than 88%, wean as tolerated  Acute on chronic diastolic CHF - diuresing as below Net IO Since Admission: -9,390.53 mL [02/01/20 1657] A. fib with RVR -POA, heart rate now controlled. --Diuresed with Lasix 60 mg IV twice daily  --Transitioned to oral torsemide 80 mg twice daily for discharge --Cardiology consulted, appreciate recommendations --Continue metoprolol and amiodarone --Warfarin dosing per pharmacy, Daily INRs --daily wt at home Follow up with Dr. Juliann Pares in 1 week.  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  Recently treated with Rocephin for same in November.  Does have prior history of MRSA positive culture in November.   Patient been afebrile and without leukocytosis, but did report increase in the warmth and erythema prior to admission. Completed antibiotics during admission (vancomycin initially >> doxy  and Keflex).   WOC consulted. Elevate legs and compression wraps.   Neck and shoulder pain - improved with Soma more so than Robaxin.   Prescribed Soma as needed. Heat packs as needed per patient preference. Pain management follow up - patient needs new  referral to pain clinic, suggest PCP follow up for referral. Provided some contact information for couple of local pain clinics for patient to contact himself also.  Alcohol abuse -patient reported last drink was 12/10, prior to that last drink was 3 weeks ago.   Placed on CIWA protocol but no major withdrawal symptoms.  Thiamine supplement.  Hx of Hypertension - with soft BPs during admission in the setting of diuresis.   Continue metoprolol, diuresis as above.   History of PE on Coumadin -patient previously had breakthrough PE while on NOAC, hence Coumadin.   Due to subtherapeutic INR on admission, therapeutic dose Lovenox was used to bridge.  --Warfarin continued with dosing per pharmacy.    Elevated troponin -likely demand ischemia in the setting of acute on chronic hypoxia.  Patient had brief episode of chest pain which is resolved, no ischemic changes on EKG.  Continue aspirin and Lipitor.    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  Continue daily supplement.  OSA -on CPAP, ordered  Hypothyroidism -continue Synthroid  Type 2 diabetes -A1c 6.2% in September 2021.  Covered with scale NovoLog during admission.  Insomnia -continue trazodone  Morbid obesity: Body mass index is 64.89 kg/m.  Significantly complicates overall care and prognosis.  Patient would significantly benefit from lifestyle modifications aimed at weight loss.    Discharge Instructions   Discharge Instructions    (HEART FAILURE PATIENTS) Call MD:  Anytime you have any of the following symptoms: 1) 3 pound weight gain in 24 hours or 5 pounds in 1 week 2) shortness of breath, with or without a dry hacking cough 3) swelling in the hands, feet or stomach 4) if you have to sleep on extra pillows at night in order to breathe.   Complete by: As directed    Amb referral to AFIB Clinic   Complete by: As directed    Call MD  for:  extreme fatigue   Complete by: As directed    Call MD for:  persistant dizziness or light-headedness   Complete by: As directed    Call MD for:  persistant nausea and vomiting   Complete by: As directed    Call MD for:  temperature >100.4   Complete by: As directed    Diet - low sodium heart healthy   Complete by: As directed    Increase activity slowly   Complete by: As directed    No wound care   Complete by: As directed      Allergies as of 02/02/2020   No Known Allergies     Medication List    STOP taking these medications   aspirin 325 MG tablet Replaced by: aspirin 81 MG chewable tablet   furosemide 40 MG tablet Commonly known as: Lasix     TAKE these medications   albuterol 108 (90 Base) MCG/ACT inhaler Commonly known as: VENTOLIN HFA Inhale 2 puffs into the lungs every 4 (four) hours as needed for wheezing or shortness of breath.   amiodarone 200 MG tablet Commonly known as: PACERONE Take 1 tablet (200 mg total) by mouth 2 (two) times daily.   aspirin 81 MG chewable tablet Chew  1 tablet (81 mg total) by mouth daily. Start taking on: February 03, 2020 Replaces: aspirin 325 MG tablet   atorvastatin 40 MG tablet Commonly known as: LIPITOR TAKE 1 TABLET BY MOUTH ONCE DAILY   carisoprodol 350 MG tablet Commonly known as: SOMA Take 1 tablet (350 mg total) by mouth 3 (three) times daily as needed for muscle spasms.   DULoxetine 20 MG capsule Commonly known as: CYMBALTA Take 1 capsule (20 mg total) by mouth daily.   ferrous sulfate 325 (65 FE) MG tablet Take 325 mg by mouth 2 (two) times daily.   fluticasone 50 MCG/ACT nasal spray Commonly known as: FLONASE Place 2 sprays into both nostrils 2 (two) times daily.   HYDROcodone-acetaminophen 7.5-325 MG tablet Commonly known as: NORCO Take 1 tablet by mouth every 6 (six) hours as needed for up to 5 days for moderate pain or severe pain. What changed: when to take this   levothyroxine 75 MCG  tablet Commonly known as: SYNTHROID Take 1 tablet (75 mcg total) by mouth daily before breakfast.   loperamide 2 MG capsule Commonly known as: IMODIUM Take 1 capsule (2 mg total) by mouth as needed for diarrhea or loose stools.   magnesium oxide 400 (241.3 Mg) MG tablet Commonly known as: MAG-OX Take 1 tablet (400 mg total) by mouth 2 (two) times daily.   metoprolol succinate 50 MG 24 hr tablet Commonly known as: TOPROL-XL Take 2 tablets (100 mg total) by mouth daily. Take with or immediately following a meal.   montelukast 10 MG tablet Commonly known as: SINGULAIR Take 1 tablet (10 mg total) by mouth at bedtime.   multivitamin with minerals Tabs tablet Take 1 tablet by mouth daily.   naphazoline-glycerin 0.012-0.2 % Soln Commonly known as: CLEAR EYES REDNESS Place 1-2 drops into both eyes 4 (four) times daily as needed for eye irritation.   nitroGLYCERIN 0.4 MG SL tablet Commonly known as: NITROSTAT Place 1 tablet under tongue every 5 minutes as needed for chest pain. (No more than 3 doses within 15 minutes)   potassium chloride 20 MEQ packet Commonly known as: KLOR-CON Take 40 mEq by mouth daily.   SUMAtriptan 50 MG tablet Commonly known as: IMITREX Take 1 tablet (50 mg total) by mouth every 2 (two) hours as needed for migraine or headache. May repeat in 2 hours if headache persists or recurs. No more than 2 doses in a day   thiamine 100 MG tablet Take 1 tablet (100 mg total) by mouth daily.   torsemide 20 MG tablet Commonly known as: DEMADEX Take 4 tablets (80 mg total) by mouth 2 (two) times daily.   traZODone 150 MG tablet Commonly known as: DESYREL Take 1 tablet (150 mg total) by mouth at bedtime.   warfarin 5 MG tablet Commonly known as: COUMADIN Take 5 mg by mouth daily.       Follow-up Information    [facility] Follow up on 02/11/2020.   Specialty: Cardiology Why: at 11:00am. Enter through the Medical Mall  entrance Contact information: [address] [place] [address] [phone]       Dorothyann Peng D, MD. Schedule an appointment as soon as possible for a visit in 1 week(s).   Specialties: Cardiology, Internal Medicine Contact information: [address] [place] [address] [phone]        [name], DO. Schedule an appointment as soon as possible for a visit in 1 week(s).  Specialty: Family Medicine Contact information: [address] [place] [address] [phone]        [name], MD .   Specialty: Cardiology Contact information: [address] [place] [address] [phone]              No Known Allergies  Consultations:  Cardiology    Procedures/Studies: CT ANGIO CHEST PE W OR WO CONTRAST  Result Date: 01/27/2020 CLINICAL DATA:  PE suspected, abrupt onset dyspnea and AFib EXAM: CT ANGIOGRAPHY CHEST WITH CONTRAST TECHNIQUE: Multidetector CT imaging of the chest was performed using the standard protocol during bolus administration of intravenous contrast. Multiplanar CT image reconstructions and MIPs were obtained to evaluate the vascular anatomy. CONTRAST:  OMNIPAQUE IOHEXOL 350 MG/ML SOLN COMPARISON:  12/22/2019 FINDINGS: Cardiovascular: Examination for pulmonary embolism is somewhat limited by motion artifact. Within this limitation, no evidence of pulmonary embolism through the segmental pulmonary arterial level. Cardiomegaly. Left coronary artery calcifications. No pericardial effusion. Mediastinum/Nodes: No enlarged mediastinal, hilar, or axillary lymph nodes. Thyroid gland, trachea, and esophagus demonstrate no significant findings. Lungs/Pleura: Lungs are clear. No pleural effusion or pneumothorax. Upper Abdomen: No acute abnormality. Musculoskeletal: No chest wall abnormality. No acute or significant osseous findings. Review of the MIP images confirms the above  findings. IMPRESSION: 1. Examination for pulmonary embolism is somewhat limited by motion artifact. Within this limitation, no evidence of pulmonary embolism through the segmental pulmonary arterial level. 2. Cardiomegaly and coronary artery disease. Electronically Signed   By: Lauralyn Primes M.D.   On: 01/27/2020 11:15   US Venous Img Lower Bilateral (DVT)  Result Date: 01/08/2020 CLINICAL DATA:  Bilateral lower extremity pain and edema. Evaluate for DVT. EXAM: BILATERAL LOWER EXTREMITY VENOUS DOPPLER ULTRASOUND TECHNIQUE: Gray-scale sonography with graded compression, as well as color Doppler and duplex ultrasound were performed to evaluate the lower extremity deep venous systems from the level of the common femoral vein and including the common femoral, femoral, profunda femoral, popliteal and calf veins including the posterior tibial, peroneal and gastrocnemius veins when visible. The superficial great saphenous vein was also interrogated. Spectral Doppler was utilized to evaluate flow at rest and with distal augmentation maneuvers in the common femoral, femoral and popliteal veins. COMPARISON:  Bilateral lower extremity venous Doppler ultrasound-12/22/2019; 01/21/2019; 10/24/2018 (all negative); left lower extremity venous Doppler ultrasound-11/07/2019 (negative FINDINGS: Examination is degraded due to patient body habitus and poor sonographic window RIGHT LOWER EXTREMITY Common Femoral Vein: No evidence of thrombus. Normal compressibility, respiratory phasicity and response to augmentation. Saphenofemoral Junction: No evidence of thrombus. Normal compressibility and flow on color Doppler imaging. Profunda Femoral Vein: No evidence of thrombus. Normal compressibility and flow on color Doppler imaging. Femoral Vein: No evidence of thrombus. Normal compressibility, respiratory phasicity and response to augmentation. Popliteal Vein: No evidence of thrombus. Normal compressibility, respiratory phasicity and  response to augmentation. Calf Veins: Appear patent where visualized. Superficial Great Saphenous Vein: No evidence of thrombus. Normal compressibility. Venous Reflux:  None. Other Findings:  None. LEFT LOWER EXTREMITY Common Femoral Vein: No evidence of thrombus. Normal compressibility, respiratory phasicity and response to augmentation. Saphenofemoral Junction: No evidence of thrombus. Normal compressibility and flow on color Doppler imaging. Profunda Femoral Vein: No evidence of thrombus. Normal compressibility and flow on color Doppler imaging. Femoral Vein: No evidence of thrombus. Normal compressibility, respiratory phasicity and response to augmentation. Popliteal Vein: No evidence of thrombus. Normal compressibility, respiratory phasicity and response to augmentation. Calf Veins: Appear patent where visualized. Superficial Great Saphenous Vein:  No evidence of thrombus. Normal compressibility. Venous Reflux:  None. Other Findings:  None. IMPRESSION: No evidence of DVT within either lower extremity. Note, this is the 5th negative lower extremity venous Doppler ultrasound since 10/24/2018. Electronically Signed   By: Simonne Come M.D.   On: 01/08/2020 07:49   DG Chest Port 1 View  Result Date: 01/27/2020 CLINICAL DATA:  Initial evaluation for acute shortness of breath. EXAM: PORTABLE CHEST 1 VIEW COMPARISON:  Prior radiograph from 01/07/2020. FINDINGS: Cardiomegaly, stable.  Mediastinal silhouette within normal limits. Lungs mildly hypoinflated. Diffuse pulmonary vascular and interstitial congestion, suggesting mild pulmonary interstitial congestion/edema. No visible pleural effusion. No focal infiltrates. No pneumothorax. No acute osseous finding. IMPRESSION: Cardiomegaly with mild diffuse pulmonary interstitial congestion/edema. Electronically Signed   By: Rise Mu M.D.   On: 01/27/2020 04:03   DG Chest Portable 1 View  Result Date: 01/07/2020 CLINICAL DATA:  Respiratory distress, COPD  exacerbation, asthma, hypertension EXAM: PORTABLE CHEST 1 VIEW COMPARISON:  Portable exam 1545 hours compared to 12/22/2019 FINDINGS: Enlargement of cardiac silhouette. Mediastinal contours and pulmonary vascularity normal. Lungs grossly clear. No definite infiltrate, pleural effusion, or pneumothorax. Osseous structures unremarkable. IMPRESSION: Enlargement of cardiac silhouette. No acute abnormalities. Electronically Signed   By: Ulyses Southward M.D.   On: 01/07/2020 16:00   US ABDOMEN LIMITED RUQ (LIVER/GB)  Result Date: 01/08/2020 CLINICAL DATA:  Right upper quadrant pain EXAM: ULTRASOUND ABDOMEN LIMITED RIGHT UPPER QUADRANT COMPARISON:  None. FINDINGS: Gallbladder: No gallstones or wall thickening visualized. No sonographic Murphy sign noted by sonographer. Common bile duct: Diameter: Normal caliber, 5 mm Liver: Increased echotexture compatible with fatty infiltration. 3.5 cm simple appearing cyst in the right hepatic lobe. Portal vein is patent on color Doppler imaging with normal direction of blood flow towards the liver. Other: None. IMPRESSION: Hepatic steatosis. Electronically Signed   By: Charlett Nose M.D.   On: 01/08/2020 10:44         Subjective: Pt wants to go home.  Tired of food here. Can't get any sleep.  No acute complaints.  Continues to have pain all over, currently neck and shoulder bothersome.  Requests referral to pain clinic.   Discharge Exam: Vitals:   02/02/20 0441 02/02/20 0800  BP: 106/66 135/73  Pulse: 63 76  Resp: 18 18  Temp: 98.8 F (37.1 C) 98.3 F (36.8 C)  SpO2: 93% 92%   Vitals:   02/01/20 1700 02/01/20 2003 02/02/20 0441 02/02/20 0800  BP: 125/84 (!) 144/97 106/66 135/73  Pulse: 75 74 63 76  Resp: 18 18 18 18   Temp: 97.7 F (36.5 C) 98.7 F (37.1 C) 98.8 F (37.1 C) 98.3 F (36.8 C)  TempSrc:  Oral    SpO2: 95% 97% 93% 92%  Weight:   (!) 192.2 kg   Height:        General: Pt is alert, awake, not in acute distress, morbidly  obese Cardiovascular: RRR, S1/S2 +, no rubs, no gallops Respiratory: CTA bilaterally, no wheezing, no rhonchi Abdominal: Soft, NT, ND, bowel sounds + Extremities: B/L LE's wrapped, moves all    The results of significant diagnostics from this hospitalization (including imaging, microbiology, ancillary and laboratory) are listed below for reference.     Microbiology: Recent Results (from the past 240 hour(s))  Resp Panel by RT-PCR (Flu A&B, Covid) Nasopharyngeal Swab     Status: None   Collection Time: 01/27/20  3:19 AM   Specimen: Nasopharyngeal Swab; Nasopharyngeal(NP) swabs in vial transport medium  Result Value Ref Range  Status   SARS Coronavirus 2 by RT PCR NEGATIVE NEGATIVE Final    Comment: (NOTE) SARS-CoV-2 target nucleic acids are NOT DETECTED.  The SARS-CoV-2 RNA is generally detectable in upper respiratory specimens during the acute phase of infection. The lowest concentration of SARS-CoV-2 viral copies this assay can detect is 138 copies/mL. A negative result does not preclude SARS-Cov-2 infection and should not be used as the sole basis for treatment or other patient management decisions. A negative result may occur with  improper specimen collection/handling, submission of specimen other than nasopharyngeal swab, presence of viral mutation(s) within the areas targeted by this assay, and inadequate number of viral copies(<138 copies/mL). A negative result must be combined with clinical observations, patient history, and epidemiological information. The expected result is Negative.  Fact Sheet for Patients:  BloggerCourse.com  Fact Sheet for Healthcare Providers:  SeriousBroker.it  This test is no t yet approved or cleared by the [place] FDA and  has been authorized for detection and/or diagnosis of SARS-CoV-2 by FDA under an Emergency Use Authorization (EUA). This EUA will remain  in effect (meaning this  test can be used) for the duration of the COVID-19 declaration under Section 564(b)(1) of the Act, 21 U.S.C.section 360bbb-3(b)(1), unless the authorization is terminated  or revoked sooner.       Influenza A by PCR NEGATIVE NEGATIVE Final   Influenza B by PCR NEGATIVE NEGATIVE Final    Comment: (NOTE) The Xpert Xpress SARS-CoV-2/FLU/RSV plus assay is intended as an aid in the diagnosis of influenza from Nasopharyngeal swab specimens and should not be used as a sole basis for treatment. Nasal washings and aspirates are unacceptable for Xpert Xpress SARS-CoV-2/FLU/RSV testing.  Fact Sheet for Patients: BloggerCourse.com  Fact Sheet for Healthcare Providers: SeriousBroker.it  This test is not yet approved or cleared by the [place] FDA and has been authorized for detection and/or diagnosis of SARS-CoV-2 by FDA under an Emergency Use Authorization (EUA). This EUA will remain in effect (meaning this test can be used) for the duration of the COVID-19 declaration under Section 564(b)(1) of the Act, 21 U.S.C. section 360bbb-3(b)(1), unless the authorization is terminated or revoked.  Performed at [facility], [address] [place], [place] [address]   MRSA PCR Screening     Status: None   Collection Time: 01/31/20  2:44 PM   Specimen: Nasopharyngeal  Result Value Ref Range Status   MRSA by PCR NEGATIVE NEGATIVE Final    Comment:        The GeneXpert MRSA Assay (FDA approved for NASAL specimens only), is one component of a comprehensive MRSA colonization surveillance program. It is not intended to diagnose MRSA infection nor to guide or monitor treatment for MRSA infections. Performed at [facility], [address] [place], [place] [address]      Labs: BNP (last 3 results) Recent Labs    01/07/20 1537 01/27/20 0319 02/01/20 0612  BNP 141.3* 279.8* 221.2*   Basic Metabolic Panel: Recent  Labs  Lab 01/27/20 0454 01/28/20 0442 01/29/20 0442 01/30/20 0524 01/31/20 0425 02/01/20 0612 02/02/20 0547  NA  --    < > 135 136 134* 135 137  K  --    < > 3.6 3.4* 3.5 3.5 3.8  CL  --    < > 91* 92* 90* 90* 93*  CO2  --    < > 32 33* 34* 34* 36*  GLUCOSE  --    < > 121* 129* 123* 153*  126*  BUN  --    < > 8 8 8 7 11   CREATININE  --    < > 0.93 0.91 0.88 0.75 0.78  CALCIUM  --    < > 7.6* 7.6* 8.2* 9.1 9.0  MG 1.5*  --   --   --  1.9  --   --   PHOS 2.3*  --   --   --   --   --   --    < > = values in this interval not displayed.   Liver Function Tests: Recent Labs  Lab 01/27/20 0319  AST 57*  ALT 31  ALKPHOS 107  BILITOT 1.0  PROT 7.2  ALBUMIN 3.3*   No results for input(s): LIPASE, AMYLASE in the last 168 hours. No results for input(s): AMMONIA in the last 168 hours. CBC: Recent Labs  Lab 01/27/20 0319 01/29/20 0442 01/30/20 0524 01/31/20 0425 02/01/20 0612 02/02/20 0547  WBC 8.4 5.1 4.8 4.4 5.5 4.8  NEUTROABS 5.9  --   --   --   --   --   HGB 12.0* 11.0* 10.8* 12.1* 12.5* 10.7*  HCT 37.8* 34.1* 33.7* 38.0* 39.3 33.7*  MCV 92.4 93.9 94.9 95.7 95.2 94.9  PLT 296 160 153 160 175 153   Cardiac Enzymes: No results for input(s): CKTOTAL, CKMB, CKMBINDEX, TROPONINI in the last 168 hours. BNP: Invalid input(s): POCBNP CBG: Recent Labs  Lab 02/01/20 1137 02/01/20 1657 02/01/20 2033 02/02/20 0821 02/02/20 1129  GLUCAP 117* 187* 132* 133* 118*   D-Dimer No results for input(s): DDIMER in the last 72 hours. Hgb A1c No results for input(s): HGBA1C in the last 72 hours. Lipid Profile No results for input(s): CHOL, HDL, LDLCALC, TRIG, CHOLHDL, LDLDIRECT in the last 72 hours. Thyroid function studies No results for input(s): TSH, T4TOTAL, T3FREE, THYROIDAB in the last 72 hours.  Invalid input(s): FREET3 Anemia work up No results for input(s): VITAMINB12, FOLATE, FERRITIN, TIBC, IRON, RETICCTPCT in the last 72 hours. Urinalysis    Component Value  Date/Time   COLORURINE YELLOW (A) 01/07/2020 1535   APPEARANCEUR CLOUDY (A) 01/07/2020 1535   APPEARANCEUR Hazy (A) 12/25/2018 1442   LABSPEC 1.009 01/07/2020 1535   PHURINE 7.0 01/07/2020 1535   GLUCOSEU NEGATIVE 01/07/2020 1535   HGBUR MODERATE (A) 01/07/2020 1535   BILIRUBINUR NEGATIVE 01/07/2020 1535   BILIRUBINUR Negative 12/25/2018 1442   KETONESUR NEGATIVE 01/07/2020 1535   PROTEINUR NEGATIVE 01/07/2020 1535   NITRITE NEGATIVE 01/07/2020 1535   LEUKOCYTESUR TRACE (A) 01/07/2020 1535   Sepsis Labs Invalid input(s): PROCALCITONIN,  WBC,  LACTICIDVEN Microbiology Recent Results (from the past 240 hour(s))  Resp Panel by RT-PCR (Flu A&B, Covid) Nasopharyngeal Swab     Status: None   Collection Time: 01/27/20  3:19 AM   Specimen: Nasopharyngeal Swab; Nasopharyngeal(NP) swabs in vial transport medium  Result Value Ref Range Status   SARS Coronavirus 2 by RT PCR NEGATIVE NEGATIVE Final    Comment: (NOTE) SARS-CoV-2 target nucleic acids are NOT DETECTED.  The SARS-CoV-2 RNA is generally detectable in upper respiratory specimens during the acute phase of infection. The lowest concentration of SARS-CoV-2 viral copies this assay can detect is 138 copies/mL. A negative result does not preclude SARS-Cov-2 infection and should not be used as the sole basis for treatment or other patient management decisions. A negative result may occur with  improper specimen collection/handling, submission of specimen other than nasopharyngeal swab, presence of viral mutation(s) within the areas targeted by this  assay, and inadequate number of viral copies(<138 copies/mL). A negative result must be combined with clinical observations, patient history, and epidemiological information. The expected result is Negative.  Fact Sheet for Patients:  BloggerCourse.com  Fact Sheet for Healthcare Providers:  SeriousBroker.it  This test is no t yet  approved or cleared by the [place] FDA and  has been authorized for detection and/or diagnosis of SARS-CoV-2 by FDA under an Emergency Use Authorization (EUA). This EUA will remain  in effect (meaning this test can be used) for the duration of the COVID-19 declaration under Section 564(b)(1) of the Act, 21 U.S.C.section 360bbb-3(b)(1), unless the authorization is terminated  or revoked sooner.       Influenza A by PCR NEGATIVE NEGATIVE Final   Influenza B by PCR NEGATIVE NEGATIVE Final    Comment: (NOTE) The Xpert Xpress SARS-CoV-2/FLU/RSV plus assay is intended as an aid in the diagnosis of influenza from Nasopharyngeal swab specimens and should not be used as a sole basis for treatment. Nasal washings and aspirates are unacceptable for Xpert Xpress SARS-CoV-2/FLU/RSV testing.  Fact Sheet for Patients: BloggerCourse.com  Fact Sheet for Healthcare Providers: SeriousBroker.it  This test is not yet approved or cleared by the [place] FDA and has been authorized for detection and/or diagnosis of SARS-CoV-2 by FDA under an Emergency Use Authorization (EUA). This EUA will remain in effect (meaning this test can be used) for the duration of the COVID-19 declaration under Section 564(b)(1) of the Act, 21 U.S.C. section 360bbb-3(b)(1), unless the authorization is terminated or revoked.  Performed at [facility], [address] [place], [place] [address]   MRSA PCR Screening     Status: None   Collection Time: 01/31/20  2:44 PM   Specimen: Nasopharyngeal  Result Value Ref Range Status   MRSA by PCR NEGATIVE NEGATIVE Final    Comment:        The GeneXpert MRSA Assay (FDA approved for NASAL specimens only), is one component of a comprehensive MRSA colonization surveillance program. It is not intended to diagnose MRSA infection nor to guide or monitor treatment for MRSA infections. Performed at [facility], [address] [place], [place] [address]      Time coordinating discharge: Over 30 minutes  SIGNED:   Pennie Banter, DO [facility] 02/02/2020, 11:42 AM   If 7PM-7AM, please contact night-coverage [email or web address]

## 2020-02-02 NOTE — Progress Notes (Addendum)
Patient Name: [name] [name] Date of Encounter: 02/02/2020  Hospital Problem List     Principal Problem:   Acute on chronic respiratory failure with hypoxia [facility]) Active Problems:   Acute on chronic diastolic CHF (congestive heart failure) (HCC)   HTN (hypertension)   Morbid obesity with BMI of 60.0-69.9, adult (HCC)   History of pulmonary embolism (on Coumadin)   Atrial fibrillation with RVR (HCC)   CAD (coronary artery disease)   Long term (current) use of anticoagulants   Alcohol withdrawal syndrome without complication (HCC)   CHF (congestive heart failure) (HCC)    Patient Profile     Patient with history of chronic respiratory failure, morbid obesity with a BMI of greater than 60, hypertension, chronic atrial fibrillation, history of pulmonary embolism on warfarin, history of coronary disease, history of HFpEF with ejection fraction of 55 to 60% was admitted with progressive shortness of breath.  He has been diuresed approximately 11.35 L.  He still complains of shortness of breath.   Subjective   Still sob with myalgias but improved. Is asking if he can be discharged.   Inpatient Medications    . amiodarone  200 mg Oral BID  . aspirin  81 mg Oral Daily  . atorvastatin  40 mg Oral Daily  . carisoprodol  350 mg Oral TID  . doxycycline  100 mg Oral Q12H  . DULoxetine  40 mg Oral Daily  . ferrous sulfate  325 mg Oral BID  . fluticasone  2 spray Each Nare BID  . folic acid  1 mg Oral Daily  . furosemide  60 mg Intravenous Q12H  . insulin aspart  0-15 Units Subcutaneous TID WC  . insulin aspart  0-5 Units Subcutaneous QHS  . levothyroxine  75 mcg Oral Q0600  . magnesium oxide  400 mg Oral BID  . metoprolol succinate  100 mg Oral Daily  . montelukast  10 mg Oral QHS  . multivitamin with minerals  1 tablet Oral Daily  . polyethylene glycol  17 g Oral Daily  . potassium chloride  40 mEq Oral Daily  . sodium chloride flush  3 mL Intravenous Q12H  . thiamine  100 mg  Oral Daily   Or  . thiamine  100 mg Intravenous Daily  . traZODone  150 mg Oral QHS  . warfarin  6 mg Oral ONCE-1600  . Warfarin - Pharmacist Dosing Inpatient   Does not apply q1600    Vital Signs    Vitals:   02/01/20 1700 02/01/20 2003 02/02/20 0441 02/02/20 0800  BP: 125/84 (!) 144/97 106/66 135/73  Pulse: 75 74 63 76  Resp: 18 18 18 18   Temp: 97.7 F (36.5 C) 98.7 F (37.1 C) 98.8 F (37.1 C) 98.3 F (36.8 C)  TempSrc:  Oral    SpO2: 95% 97% 93% 92%  Weight:   (!) 192.2 kg   Height:        Intake/Output Summary (Last 24 hours) at 02/02/2020 1101 Last data filed at 02/02/2020 0945 Gross per 24 hour  Intake 720 ml  Output 3860 ml  Net -3140 ml   Filed Weights   01/31/20 0211 02/01/20 0214 02/02/20 0441  Weight: (!) 193.6 kg (!) 193.6 kg (!) 192.2 kg    Physical Exam    GEN: morbidly obese.   HEENT: normal.  Neck: Supple, no JVD, carotid bruits, or masses. Cardiac: irr, irr,unna boots on lower extremeties.  Respiratory:  Respirations regular and unlabored, clear to auscultation bilaterally. GI:  morbid obesity. MS: no deformity or atrophy. Skin: warm and dry, . Neuro:  Strength and sensation are intact. Psych: Normal affect.  Labs    CBC Recent Labs    02/01/20 0612 02/02/20 0547  WBC 5.5 4.8  HGB 12.5* 10.7*  HCT 39.3 33.7*  MCV 95.2 94.9  PLT 175 153   Basic Metabolic Panel Recent Labs    24/26/83 0425 02/01/20 0612 02/02/20 0547  NA 134* 135 137  K 3.5 3.5 3.8  CL 90* 90* 93*  CO2 34* 34* 36*  GLUCOSE 123* 153* 126*  BUN 8 7 11   CREATININE 0.88 0.75 0.78  CALCIUM 8.2* 9.1 9.0  MG 1.9  --   --    Liver Function Tests No results for input(s): AST, ALT, ALKPHOS, BILITOT, PROT, ALBUMIN in the last 72 hours. No results for input(s): LIPASE, AMYLASE in the last 72 hours. Cardiac Enzymes No results for input(s): CKTOTAL, CKMB, CKMBINDEX, TROPONINI in the last 72 hours. BNP Recent Labs    02/01/20 0612  BNP 221.2*   D-Dimer No  results for input(s): DDIMER in the last 72 hours. Hemoglobin A1C No results for input(s): HGBA1C in the last 72 hours. Fasting Lipid Panel No results for input(s): CHOL, HDL, LDLCALC, TRIG, CHOLHDL, LDLDIRECT in the last 72 hours. Thyroid Function Tests No results for input(s): TSH, T4TOTAL, T3FREE, THYROIDAB in the last 72 hours.  Invalid input(s): FREET3  Telemetry    afib with controlled v  ECG    afib with no obvious ischemia  Radiology    CT ANGIO CHEST PE W OR WO CONTRAST  Result Date: 01/27/2020 CLINICAL DATA:  PE suspected, abrupt onset dyspnea and AFib EXAM: CT ANGIOGRAPHY CHEST WITH CONTRAST TECHNIQUE: Multidetector CT imaging of the chest was performed using the standard protocol during bolus administration of intravenous contrast. Multiplanar CT image reconstructions and MIPs were obtained to evaluate the vascular anatomy. CONTRAST:  14/01/2020 OMNIPAQUE IOHEXOL 350 MG/ML SOLN COMPARISON:  12/22/2019 FINDINGS: Cardiovascular: Examination for pulmonary embolism is somewhat limited by motion artifact. Within this limitation, no evidence of pulmonary embolism through the segmental pulmonary arterial level. Cardiomegaly. Left coronary artery calcifications. No pericardial effusion. Mediastinum/Nodes: No enlarged mediastinal, hilar, or axillary lymph nodes. Thyroid gland, trachea, and esophagus demonstrate no significant findings. Lungs/Pleura: Lungs are clear. No pleural effusion or pneumothorax. Upper Abdomen: No acute abnormality. Musculoskeletal: No chest wall abnormality. No acute or significant osseous findings. Review of the MIP images confirms the above findings. IMPRESSION: 1. Examination for pulmonary embolism is somewhat limited by motion artifact. Within this limitation, no evidence of pulmonary embolism through the segmental pulmonary arterial level. 2. Cardiomegaly and coronary artery disease. Electronically Signed   By: 13/07/2019 M.D.   On: 01/27/2020 11:15   14/01/2020 Venous  Img Lower Bilateral (DVT)  Result Date: 01/08/2020 CLINICAL DATA:  Bilateral lower extremity pain and edema. Evaluate for DVT. EXAM: BILATERAL LOWER EXTREMITY VENOUS DOPPLER ULTRASOUND TECHNIQUE: Gray-scale sonography with graded compression, as well as color Doppler and duplex ultrasound were performed to evaluate the lower extremity deep venous systems from the level of the common femoral vein and including the common femoral, femoral, profunda femoral, popliteal and calf veins including the posterior tibial, peroneal and gastrocnemius veins when visible. The superficial great saphenous vein was also interrogated. Spectral Doppler was utilized to evaluate flow at rest and with distal augmentation maneuvers in the common femoral, femoral and popliteal veins. COMPARISON:  Bilateral lower extremity venous Doppler ultrasound-12/22/2019; 01/21/2019; 10/24/2018 (all negative); left lower  extremity venous Doppler ultrasound-11/07/2019 (negative FINDINGS: Examination is degraded due to patient body habitus and poor sonographic window RIGHT LOWER EXTREMITY Common Femoral Vein: No evidence of thrombus. Normal compressibility, respiratory phasicity and response to augmentation. Saphenofemoral Junction: No evidence of thrombus. Normal compressibility and flow on color Doppler imaging. Profunda Femoral Vein: No evidence of thrombus. Normal compressibility and flow on color Doppler imaging. Femoral Vein: No evidence of thrombus. Normal compressibility, respiratory phasicity and response to augmentation. Popliteal Vein: No evidence of thrombus. Normal compressibility, respiratory phasicity and response to augmentation. Calf Veins: Appear patent where visualized. Superficial Great Saphenous Vein: No evidence of thrombus. Normal compressibility. Venous Reflux:  None. Other Findings:  None. LEFT LOWER EXTREMITY Common Femoral Vein: No evidence of thrombus. Normal compressibility, respiratory phasicity and response to  augmentation. Saphenofemoral Junction: No evidence of thrombus. Normal compressibility and flow on color Doppler imaging. Profunda Femoral Vein: No evidence of thrombus. Normal compressibility and flow on color Doppler imaging. Femoral Vein: No evidence of thrombus. Normal compressibility, respiratory phasicity and response to augmentation. Popliteal Vein: No evidence of thrombus. Normal compressibility, respiratory phasicity and response to augmentation. Calf Veins: Appear patent where visualized. Superficial Great Saphenous Vein: No evidence of thrombus. Normal compressibility. Venous Reflux:  None. Other Findings:  None. IMPRESSION: No evidence of DVT within either lower extremity. Note, this is the 5th negative lower extremity venous Doppler ultrasound since 10/24/2018. Electronically Signed   By: Simonne Come M.D.   On: 01/08/2020 07:49   DG Chest Port 1 View  Result Date: 01/27/2020 CLINICAL DATA:  Initial evaluation for acute shortness of breath. EXAM: PORTABLE CHEST 1 VIEW COMPARISON:  Prior radiograph from 01/07/2020. FINDINGS: Cardiomegaly, stable.  Mediastinal silhouette within normal limits. Lungs mildly hypoinflated. Diffuse pulmonary vascular and interstitial congestion, suggesting mild pulmonary interstitial congestion/edema. No visible pleural effusion. No focal infiltrates. No pneumothorax. No acute osseous finding. IMPRESSION: Cardiomegaly with mild diffuse pulmonary interstitial congestion/edema. Electronically Signed   By: Rise Mu M.D.   On: 01/27/2020 04:03   DG Chest Portable 1 View  Result Date: 01/07/2020 CLINICAL DATA:  Respiratory distress, COPD exacerbation, asthma, hypertension EXAM: PORTABLE CHEST 1 VIEW COMPARISON:  Portable exam 1545 hours compared to 12/22/2019 FINDINGS: Enlargement of cardiac silhouette. Mediastinal contours and pulmonary vascularity normal. Lungs grossly clear. No definite infiltrate, pleural effusion, or pneumothorax. Osseous structures  unremarkable. IMPRESSION: Enlargement of cardiac silhouette. No acute abnormalities. Electronically Signed   By: Ulyses Southward M.D.   On: 01/07/2020 16:00   US ABDOMEN LIMITED RUQ (LIVER/GB)  Result Date: 01/08/2020 CLINICAL DATA:  Right upper quadrant pain EXAM: ULTRASOUND ABDOMEN LIMITED RIGHT UPPER QUADRANT COMPARISON:  None. FINDINGS: Gallbladder: No gallstones or wall thickening visualized. No sonographic Murphy sign noted by sonographer. Common bile duct: Diameter: Normal caliber, 5 mm Liver: Increased echotexture compatible with fatty infiltration. 3.5 cm simple appearing cyst in the right hepatic lobe. Portal vein is patent on color Doppler imaging with normal direction of blood flow towards the liver. Other: None. IMPRESSION: Hepatic steatosis. Electronically Signed   By: Charlett Nose M.D.   On: 01/08/2020 10:44    Assessment & Plan     Principal Problem: Acute on chronic respiratory failure with hypoxia (HCC) Active Problems: Acute on chronic diastolic CHF (congestive heart failure) (HCC) HTN (hypertension) Morbid obesity with BMI of 60.0-69.9, adult (HCC) History of pulmonary embolism (on Coumadin) Atrial fibrillation with RVR (HCC) CAD (coronary artery disease) Long term (current) use of anticoagulants Alcohol withdrawal syndrome without complication (HCC) CHF (congestive heart failure) (  HCC)   1.Acute on chronic respiratory failure with hypoxia, multifactorial, secondary to COPD, obstructive sleep apnea, acute on chronic diastolic congestive heart failure, exacerbated by medical and dietary noncompliance, obesity, and daily alcohol abuse. Patient has a history of prior pulmonary embolus. Is on warfarin.l Was sub therapeutic on admission and is currently 1.9 Goal of 2-3.  ChestCTdoes not reveal pulmonary embolus.Net output -9L we will continue with IV Lasix. BNP yesterday was 221 improved slightly from 6 days ago. Appears to be at his baseline.   2.Acute on chronic diastolic congestive heart failure,  noncompliant with diet as outpatient, states he is compliant with medications, BNP 279.8,onIVfurosemide60 mgBID.  As per above.  We will continue with IV Lasix and follow symptoms and  BNP.  Will need close outpatient follow-up with daily weights although the patient states he is not able to stand on the scale.  Can discuss consideration for cardio mems at outpatient appt. 3.Atrial fibrillation with rapid ventricular rate, therapeutic on warfarin for stroke prevention, rate controlled. On amiodarone 200 mg BID and metoprolol succinate 100 mg daily. 4.Chest pain, atypical features, borderline elevated high-sensitivity troponin (62, 68, 91, 37)without delta, with nondiagnostic ECG 5.Alcohol withdrawal syndrome, with tremulousness and tachycardia,inpatient with daily alcohol use 6. History of pulmonary embolus,therapeutic on warfarin,noevidence of current PE on chest CT  Recommendations  1.Continue diuresis with diuresis and change to torsemide 80 mg bid which he has been on at home.  BNP improved slightly and renal function is stable.   Low-sodium diet. 2.Renal status stable.  creatinine remains stable at 0.78 with a GFR of greater than 60. 3.Continue metoprolol succinate 100 mg daily, amiodarone 200 mg twice daily for rate and rhythm control 4.Continue warfarin, target INR 2-3. 1.9 today 5.Follow-up with CongestiveHeartFailure clinic and Dr. Haig Prophet to 2 weeks following discharge.  Low-sodium diet.  Appears to be near his baseline. Should be ok to send home today. Would discharge on torsemide 80 bid, amiodarone 200 bid, atorvastatin 40 mg daily, hydralazine 25 mg q 6, metoprolol 100 daily, and warfarin. Appt with dr Juliann Pares in 1 week. Keep UNNA boots in place.   Signed, Darlin Priestly Terrisha Lopata MD 02/02/2020, 11:01 AM  Pager: [phone]

## 2020-02-02 NOTE — Progress Notes (Signed)
Patient discharged at this time to home with all belongings on 4L of oxygen via wheelchair. Patient verbalized understanding of discharge instructions. PIV/Tele removed by Clinical research associate. NAD noted upon departing.

## 2020-02-02 NOTE — Plan of Care (Signed)

## 2020-02-02 NOTE — Discharge Instructions (Signed)
Here are the names and contact information for some local pain clinics.  I recommend you call them to request appointment, and also have your primary care doctor do referral if pain clinic or your insurance requires a referral.  [facility] [address]  [phone]  Ronita Hipps  [address] [place] [phone]

## 2020-02-02 NOTE — Progress Notes (Signed)
ANTICOAGULATION CONSULT NOTE  Pharmacy Consult for Warfarin  Indication: Afib and Hx of PE  No Known Allergies  Patient Measurements: Height: 5\' 8"  (172.7 cm) Weight: (!) 192.2 kg (423 lb 12.8 oz) IBW/kg (Calculated) : 68.4  Vital Signs: Temp: 98.3 F (36.8 C) (12/18 0800) BP: 135/73 (12/18 0800) Pulse Rate: 76 (12/18 0800)  Labs: Recent Labs    01/31/20 0425 02/01/20 0612 02/02/20 0547  HGB 12.1* 12.5* 10.7*  HCT 38.0* 39.3 33.7*  PLT 160 175 153  LABPROT 24.6* 25.3* 21.3*  INR 2.3* 2.4* 1.9*  CREATININE 0.88 0.75 0.78    Estimated Creatinine Clearance: 165.8 mL/min (by C-G formula based on SCr of 0.78 mg/dL).  Medical History: Past Medical History:  Diagnosis Date  . Allergy   . Anxiety   . Arthritis   . Asthma   . Brain damage   . Chronic pain of both knees 07/13/2018  . Clotting disorder (HCC)   . COPD (chronic obstructive pulmonary disease) (HCC)   . Depression   . GERD (gastroesophageal reflux disease)   . HFrEF (heart failure with reduced ejection fraction) (HCC)    a. 03/2018 Echo: EF 25-30%, diff HK. Mod LAE.  04/2018 History of DVT (deep vein thrombosis)   . History of pulmonary embolism    a. Chronic coumadin.  [name] Hypertension   . MI (myocardial infarction) (HCC)   . Morbid obesity (HCC)   . Neck pain 07/13/2018  . NICM (nonischemic cardiomyopathy) (HCC)    a. s/p Cath x 3 - reportedly nl cors. Last cath 2019 in [place]; b. a. 03/2018 Echo: EF 25-30%, diff HK.  04/2018 Persistent atrial fibrillation (HCC)    a. 03/2018 s/p DCCV; b. CHA2DS2VASc = 1-->Xarelto (later changed to warfarin); c. 05/2018 recurrent afib-->Amio initiated.  . Sleep apnea   . Sleep apnea    Medications:  PTA - Coumadin 5mg  daily, pt reports last dose pta 5mg  12/11 PTA - Amiodarone 200 mg twice daily - DDI may increase INR  Ceftriaxone 2 g q24h -  DDI may increase INR   Assessment: 59 year old male presenting with shortness of breath. PMH includes deafness in left ear, morbid obesity, COPD,  grade 1 diastolic dysfunction, HTN, hypothyroidism, anxiety/depression, obstructive sleep apnea endorses compliance on CPAP, hyperlipidemia, alcohol abuse, chronic atrial fibrillation and hx pulmonary emboli on warfarin. 12/12 Chest CT Examination for pulmonary embolism is somewhat limited by motion artifact. Within this limitation, no evidence of pulmonary embolism through the segmental pulmonary arterial level. Reported warfarin dose prior to admission 5mg  daily and pt returned with a subtherapeutic INR. Pt was on lovenox for bridging therapy - lovenox now discontinued for INR within goal range x 2 readings, stopped 12/16. Pharmacy will continue to dose warfarin and monitor daily INR/CBC.   Hgb 12.5, plt 175   Date INR Warfarin dose   5mg  (last dose pta per report) 12/12  1.2 7.5mg  12/13 1.2 10mg  12/14 1.4 12mg  12/15 2.0 5mg  Therapeutic x 1 12/16 2.3 5mg  Therapeutic x 2, enoxaparin discontinued  12/17 2.4 5mg  Therapeutic x 3 12/18 1.9 6mg    Goal of Therapy:  INR Goal 2-3  Plan:  INR slightly subtherapeutic. Pt has a large body mass which may warrant higher warfarin doses. I do not think warfairn 5 mg daily will not be enough. INR is trending back down once starting the 5 mg daily regimen. Will increase warfarin dose to 6 mg x 1. Daily INR ordered. CBC at least every 3 days.   Discharge  Recommendation: Recommend discharging the patient on warfarin 6 mg daily (~20% increase from home dose). Pt may potentially need 7.5 mg daily, once more data is available on the 6 mg dose. 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   Ronnald Ramp, PharmD 02/02/2020 9:55 AM

## 2020-02-04 ENCOUNTER — Telehealth: Payer: Self-pay

## 2020-02-04 DIAGNOSIS — G4733 Obstructive sleep apnea (adult) (pediatric): Secondary | ICD-10-CM | POA: Diagnosis not present

## 2020-02-04 NOTE — Telephone Encounter (Signed)
Transition Care Management Follow-up Telephone Call  Date of discharge and from where: 02/02/2020 from [facility]  How have you been since you were released from the hospital? Pt is feeling better but still not the best.   Any questions or concerns? Yes Patient is not able to reach the wound care clinic to schedule an appointment.   Items Reviewed:  Did the pt receive and understand the discharge instructions provided? Yes   Medications obtained and verified? Yes   Other? No   Any new allergies since your discharge? No   Dietary orders reviewed? Yes  Do you have support at home? Yes   Functional Questionnaire: (I = Independent and D = Dependent) ADLs - Dependent with walker.   Bathing/Dressing - Dependent due to pain and SOB.   Meal Prep - I  Eating - I  Maintaining continence-  I  Transferring/Ambulation - Dependent with walker.  Managing Meds - I    Follow up appointments reviewed:   Specialist Hospital f/u appt confirmed? Yes  Scheduled to see [facility] FNP on 02/11/2020 @ 11:00am.  Are transportation arrangements needed? No   If their condition worsens, is the pt aware to call PCP or go to the Emergency Dept.? Yes  Was the patient provided with contact information for the PCP's office or ED? Yes  Was to pt encouraged to call back with questions or concerns? Yes  .

## 2020-02-07 ENCOUNTER — Other Ambulatory Visit: Payer: Self-pay

## 2020-02-07 ENCOUNTER — Other Ambulatory Visit: Payer: Self-pay | Admitting: *Deleted

## 2020-02-07 DIAGNOSIS — Z139 Encounter for screening, unspecified: Secondary | ICD-10-CM

## 2020-02-07 NOTE — Patient Outreach (Signed)
Care Coordination - Case Manager  02/07/2020  [name] [name] December 20, 1960 [id number]  Subjective:  [name] [name] is an 59 y.o. year old male who is a primary patient of Althea Charon, Netta Neat, DO.  [name] [name] was given information about Medicaid Managed Care team care coordination services today. [name] agreed to services and verbal consent obtained  Review of patient status, laboratory and other test data was performed as part of evaluation for provision of services.  SDOH: SDOH Screenings   Alcohol Screen: Low Risk   . Last Alcohol Screening Score (AUDIT): 6  Depression (PHQ2-9): Medium Risk  . PHQ-2 Score: 5  Financial Resource Strain: Medium Risk  . Difficulty of Paying Living Expenses: Somewhat hard  Food Insecurity: No Food Insecurity  . Worried About Programme researcher, broadcasting/film/video in the Last Year: Never true  . Ran Out of Food in the Last Year: Never true  Housing: Low Risk   . Last Housing Risk Score: 0  Physical Activity: Inactive  . Days of Exercise per Week: 0 days  . Minutes of Exercise per Session: 0 min  Social Connections: Socially Isolated  . Frequency of Communication with Friends and Family: Once a week  . Frequency of Social Gatherings with Friends and Family: Once a week  . Attends Religious Services: Never  . Active Member of Clubs or Organizations: No  . Attends Banker Meetings: Never  . Marital Status: Widowed  Stress: Stress Concern Present  . Feeling of Stress : Rather much  Tobacco Use: Low Risk   . Smoking Tobacco Use: Never Smoker  . Smokeless Tobacco Use: Never Used  Transportation Needs: Unmet Transportation Needs  . Lack of Transportation (Medical): Yes  . Lack of Transportation (Non-Medical): Yes     Objective:    No Known Allergies  Medications:    Medications Reviewed Today    Reviewed by Heidi Dach, RN (Registered Nurse) on 02/07/20 at 1106  Med List Status: <None>  Medication Order Taking? Sig  Documenting Provider Last Dose Status Informant  albuterol (VENTOLIN HFA) 108 (90 Base) MCG/ACT inhaler [id number] Yes Inhale 2 puffs into the lungs every 4 (four) hours as needed for wheezing or shortness of breath. [name], DO Taking Active Multiple Informants  amiodarone (PACERONE) 200 MG tablet [id number] No Take 1 tablet (200 mg total) by mouth 2 (two) times daily. Enedina Finner, MD Unknown Active Multiple Informants  aspirin 81 MG chewable tablet [id number] No Chew 1 tablet (81 mg total) by mouth daily. Esaw Grandchild A, DO Unknown Active   atorvastatin (LIPITOR) 40 MG tablet [id number] No TAKE 1 TABLET BY MOUTH ONCE DAILY  Patient taking differently: Take 40 mg by mouth daily.   Flinchum, [name], FNP Unknown Active Multiple Informants  carisoprodol (SOMA) 350 MG tablet [id number] No Take 1 tablet (350 mg total) by mouth 3 (three) times daily as needed for muscle spasms.  Patient not taking: Reported on 02/07/2020   Pennie Banter, DO Not Taking Active            Med Note (Litisha Guagliardo A   Thu Feb 07, 2020 10:59 AM) "Not covered by Medicaid"  DULoxetine (CYMBALTA) 20 MG capsule [id number] No Take 1 capsule (20 mg total) by mouth daily. [name], DO Unknown Active Multiple Informants  ferrous sulfate 325 (65 FE) MG tablet [id number] No Take 325 mg by mouth 2 (two) times daily. [provider] Unknown Active Multiple Informants  fluticasone (FLONASE) 50 MCG/ACT nasal  spray [id number] Yes Place 2 sprays into both nostrils 2 (two) times daily.  [provider] Taking Active Multiple Informants  HYDROcodone-acetaminophen (NORCO) 7.5-325 MG tablet [id number] Yes Take 1 tablet by mouth every 6 (six) hours as needed for up to 5 days for moderate pain or severe pain. Pennie Banter, DO Taking Active   levothyroxine (SYNTHROID) 75 MCG tablet [id number] No Take 1 tablet (75 mcg total) by mouth daily before breakfast. Enedina Finner, MD Unknown Active  Multiple Informants  loperamide (IMODIUM) 2 MG capsule [id number] No Take 1 capsule (2 mg total) by mouth as needed for diarrhea or loose stools. Arnetha Courser, MD Unknown Active Multiple Informants  magnesium oxide (MAG-OX) 400 (241.3 Mg) MG tablet [id number] No Take 1 tablet (400 mg total) by mouth 2 (two) times daily. Enedina Finner, MD Unknown Active Multiple Informants  metoprolol succinate (TOPROL-XL) 50 MG 24 hr tablet [id number] No Take 2 tablets (100 mg total) by mouth daily. Take with or immediately following a meal. Enedina Finner, MD Unknown Active Multiple Informants  montelukast (SINGULAIR) 10 MG tablet [id number] No Take 1 tablet (10 mg total) by mouth at bedtime. [name], DO Unknown Active Multiple Informants  Multiple Vitamin (MULTIVITAMIN WITH MINERALS) TABS tablet [id number] No Take 1 tablet by mouth daily. [provider] Unknown Active Multiple Informants  naphazoline-glycerin (CLEAR EYES REDNESS) 0.012-0.2 % SOLN [id number] Yes Place 1-2 drops into both eyes 4 (four) times daily as needed for eye irritation. Alford Highland, MD Taking Active Multiple Informants  nitroGLYCERIN (NITROSTAT) 0.4 MG SL tablet [id number] No Place 1 tablet under tongue every 5 minutes as needed for chest pain. (No more than 3 doses within 15 minutes) Flinchum, [name], FNP Unknown Active Multiple Informants  potassium chloride (KLOR-CON) 20 MEQ packet [id number] No Take 40 mEq by mouth daily. Enedina Finner, MD Unknown Active Multiple Informants  SUMAtriptan (IMITREX) 50 MG tablet [id number] No Take 1 tablet (50 mg total) by mouth every 2 (two) hours as needed for migraine or headache. May repeat in 2 hours if headache persists or recurs. No more than 2 doses in a day Lurene Shadow, MD Unknown Active Multiple Informants  thiamine 100 MG tablet [id number] No Take 1 tablet (100 mg total) by mouth daily. Alford Highland, MD Unknown Active Multiple Informants  torsemide (DEMADEX) 20 MG tablet  [id number] No Take 4 tablets (80 mg total) by mouth 2 (two) times daily. Esaw Grandchild A, DO Unknown Active   traZODone (DESYREL) 150 MG tablet [id number] No Take 1 tablet (150 mg total) by mouth at bedtime. [name], DO Unknown Active Multiple Informants  warfarin (COUMADIN) 5 MG tablet [id number] No Take 5 mg by mouth daily.  [provider] Unknown Active Multiple Informants          Assessment:   Patient Care Plan: Managing Heart Failure    Problem Identified: Symptom Exacerbation (Heart Failure)     Long-Range Goal: Symptom Exacerbation Prevented or Minimized   Start Date: 02/07/2020  Expected End Date: 04/09/2020  This Visit's Progress: On track  Priority: High  Note:   CARE PLAN ENTRY Medicaid Managed Care (see longtitudinal plan of care for additional care plan information)   Current Barriers:  [name] Knowledge deficit related to basic heart failure pathophysiology and self care management-[name] [name] lives in an apartment with a roommate. He lost his wife about 2 years ago and continues to have a difficult time dealing with the loss. He utilizes [employer] transportation, has a power wheelchair  that is needing servicing, has had Meals on Wheels in the past, but did not care for the food. Reports that he eats "everything that I should not eat".  [employer]. Home Health referral for PCS-intake was done this week, he should have assistance the early part of next week. [name] [name] is on 4L of oxygen continuous, reports having pain in his legs, hips and back and has an appointment for the pain clinic 02/18/20. His sisters live in [place], his sister in law does help when available. . Knowledge Deficits related to heart failure medications-Patient is unsure of his medications, they come prepackaged and he takes what is provided. Vikki Ports Barriers-Hx of TBI, develops migraines while reading . Financial strain . Cognitive Deficits . Transportation  Barriers . Limited Social Support  Case Manager Clinical Goal(s):  [name] Over the next 14 days, patient or parent/guardian of patient  will verbalize understanding of Heart Failure Action Plan and when to call doctor . Over the next 14 days, patient or parent/guardian of patient  will take all Heart Failure mediations as prescribed . Over the next 14 days, patient will work with [facility] pharmacist regarding; unable to procure Soma for pain management due to medication not being covered by Medicaid. . Over the next 30 days, patient will attend all scheduled appointments: Wound clinic 02/08/20, Pain Management clinic 02/18/20, Heart Failure Clinic 02/19/20. PCP 02/26/20 . Over the next 14 days, patient will work with Home Health agency [facility] [phone]) for PCS  Interventions:  . Collaborate with [facility] Pharmacist for medication review . Collaborate with Care Guide for community resources for possible meal delivery options . Encouraged heart healthy diet . Provided therapeutic listening . Provide exercises that can be done while sitting   Patient Self Care Activities:  . - drink water to stay hydrated during exercise . - make an activity or exercise plan . - pace activity allowing for rest . - track symptoms during activity in diary . - begin a notebook of services in my neighborhood or community . - follow-up on any referrals for help I am given  Initial goal documentation       Plan: RNCM will follow up with a telephone visit, 02/25/20 @ 1pm Follow-up:  Patient agrees to Care Plan and Follow-up.

## 2020-02-07 NOTE — Patient Instructions (Signed)
Visit Information  Mr. Alemu was given information about Medicaid Managed Care team care coordination services as a part of their [place] Medicaid benefit. Macky Galik verbally consented to engagement with the [facility] Managed Care team.   For questions related to your [facility] please call: [phone] or visit the homepage here: [email or web address]  If you would like to schedule transportation through your [facility], please call the following number at least 2 days in advance of your appointment: [phone]   Patient Care Plan: Managing Heart Failure    Problem Identified: Symptom Exacerbation (Heart Failure)     Long-Range Goal: Symptom Exacerbation Prevented or Minimized   Start Date: 02/07/2020  Expected End Date: 04/09/2020  This Visit's Progress: On track  Priority: High  Note:   CARE PLAN ENTRY Medicaid Managed Care (see longtitudinal plan of care for additional care plan information)   Current Barriers:   Knowledge deficit related to basic heart failure pathophysiology and self care management-Mr [place] lives in an apartment with a roommate. He lost his wife about 2 years ago and continues to have a difficult time dealing with the loss. He utilizes [employer] transportation, has a power wheelchair that is needing servicing, has had Meals on Wheels in the past, but did not care for the food. Reports that he eats "everything that I should not eat".  [employer]. Home Health referral for PCS-intake was done this week, he should have assistance the early part of next week. Mr. Chestnut is on 4L of oxygen continuous, reports having pain in his legs, hips and back and has an appointment for the pain clinic 02/18/20. His sisters live in [place], his sister in law does help when available.  Knowledge Deficits related to heart failure  medications-Patient is unsure of his medications, they come prepackaged and he takes what is provided.  Literacy Barriers-Hx of TBI, develops migraines while reading  Financial strain  Theme park manager Social Support  Case Manager Clinical Goal(s):   Over the next 14 days, patient or parent/guardian of patient  will verbalize understanding of Heart Failure Action Plan and when to call doctor  Over the next 14 days, patient or parent/guardian of patient  will take all Heart Failure mediations as prescribed  Over the next 14 days, patient will work with [facility] pharmacist regarding; unable to procure Soma for pain management due to medication not being covered by Medicaid.  Over the next 30 days, patient will attend all scheduled appointments: Wound clinic 02/08/20, Pain Management clinic 02/18/20, Heart Failure Clinic 02/19/20. PCP 02/26/20  Over the next 14 days, patient will work with [facility] [phone]) for PCS  Interventions:   Collaborate with [facility] Pharmacist for medication review  Collaborate with Care Guide for community resources for possible meal delivery options  Encouraged heart healthy diet  Provided therapeutic listening  Provide exercises that can be done while sitting   Patient Self Care Activities:   - drink water to stay hydrated during exercise  - make an activity or exercise plan  - pace activity allowing for rest  - track symptoms during activity in diary  - begin a notebook of services in my neighborhood or community  - follow-up on any referrals for help I am given  Initial goal documentation      Please see education materials related to exercise provided as print materials.     Exercises To  Do While Sitting  Exercises that you do while sitting (chair exercises) can give you many of the same benefits as full exercise. Benefits include strengthening your heart, burning calories,  and keeping muscles and joints healthy. Exercise can also improve your mood and help with depression and anxiety. You may benefit from chair exercises if you are unable to do standing exercises because of:  Diabetic foot pain.  Obesity.  Illness.  Arthritis.  Recovery from surgery or injury.  Breathing problems.  Balance problems.  Another type of disability. Before starting chair exercises, check with your health care provider or a physical therapist to find out how much exercise you can tolerate and which exercises are safe for you. If your health care provider approves:  Start out slowly and build up over time. Aim to work up to about 10-20 minutes for each exercise session.  Make exercise part of your daily routine.  Drink water when you exercise. Do not wait until you are thirsty. Drink every 10-15 minutes.  Stop exercising right away if you have pain, nausea, shortness of breath, or dizziness.  If you are exercising in a wheelchair, make sure to lock the wheels.  Ask your health care provider whether you can do tai chi or yoga. Many positions in these mind-body exercises can be modified to do while seated. Warm-up Before starting other exercises: 1. Sit up as straight as you can. Have your knees bent at 90 degrees, which is the shape of the capital letter "L." Keep your feet flat on the floor. 2. Sit at the front edge of your chair, if you can. 3. Pull in (tighten) the muscles in your abdomen and stretch your spine and neck as straight as you can. Hold this position for a few minutes. 4. Breathe in and out evenly. Try to concentrate on your breathing, and relax your mind. Stretching Exercise A: Arm stretch 1. Hold your arms out straight in front of your body. 2. Bend your hands at the wrist with your fingers pointing up, as if signaling someone to stop. Notice the slight tension in your forearms as you hold the position. 3. Keeping your arms out and your hands bent,  rotate your hands outward as far as you can and hold this stretch. Aim to have your thumbs pointing up and your pinkie fingers pointing down. Slowly repeat arm stretches for one minute as tolerated. Exercise B: Leg stretch 1. If you can move your legs, try to "draw" letters on the floor with the toes of your foot. Write your name with one foot. 2. Write your name with the toes of your other foot. Slowly repeat the movements for one minute as tolerated. Exercise C: Reach for the sky 1. Reach your hands as far over your head as you can to stretch your spine. 2. Move your hands and arms as if you are climbing a rope. Slowly repeat the movements for one minute as tolerated. Range of motion exercises Exercise A: Shoulder roll 1. Let your arms hang loosely at your sides. 2. Lift just your shoulders up toward your ears, then let them relax back down. 3. When your shoulders feel loose, rotate your shoulders in backward and forward circles. Do shoulder rolls slowly for one minute as tolerated. Exercise B: March in place 1. As if you are marching, pump your arms and lift your legs up and down. Lift your knees as high as you can. ? If you are unable to lift your knees, just  pump your arms and move your ankles and feet up and down. March in place for one minute as tolerated. Exercise C: Seated jumping jacks 1. Let your arms hang down straight. 2. Keeping your arms straight, lift them up over your head. Aim to point your fingers to the ceiling. 3. While you lift your arms, straighten your legs and slide your heels along the floor to your sides, as wide as you can. 4. As you bring your arms back down to your sides, slide your legs back together. ? If you are unable to use your legs, just move your arms. Slowly repeat seated jumping jacks for one minute as tolerated. Strengthening exercises Exercise A: Shoulder squeeze 1. Hold your arms straight out from your body to your sides, with your elbows bent  and your fists pointed at the ceiling. 2. Keeping your arms in the bent position, move them forward so your elbows and forearms meet in front of your face. 3. Open your arms back out as wide as you can with your elbows still bent, until you feel your shoulder blades squeezing together. Hold for 5 seconds. Slowly repeat the movements forward and backward for one minute as tolerated. Contact a health care provider if you:  Had to stop exercising due to any of the following: ? Pain. ? Nausea. ? Shortness of breath. ? Dizziness. ? Fatigue.  Have significant pain or soreness after exercising. Get help right away if you have:  Chest pain.  Difficulty breathing. These symptoms may represent a serious problem that is an emergency. Do not wait to see if the symptoms will go away. Get medical help right away. Call your local emergency services (911 in the [place] Do not drive yourself to the hospital. This information is not intended to replace advice given to you by your health care provider. Make sure you discuss any questions you have with your health care provider. Document Revised: 05/25/2018 Document Reviewed: 12/15/2016 Elsevier Patient Education  2020 [employer].   Patient verbalizes understanding of instructions provided today.   Telephone follow up appointment with Managed Medicaid care management team member scheduled for:02/25/20 @ [place], RN

## 2020-02-08 ENCOUNTER — Other Ambulatory Visit: Payer: Self-pay

## 2020-02-08 ENCOUNTER — Encounter: Payer: Medicaid Other | Attending: Physician Assistant | Admitting: Physician Assistant

## 2020-02-08 DIAGNOSIS — E11622 Type 2 diabetes mellitus with other skin ulcer: Secondary | ICD-10-CM | POA: Insufficient documentation

## 2020-02-08 DIAGNOSIS — I110 Hypertensive heart disease with heart failure: Secondary | ICD-10-CM | POA: Diagnosis not present

## 2020-02-08 DIAGNOSIS — L97819 Non-pressure chronic ulcer of other part of right lower leg with unspecified severity: Secondary | ICD-10-CM | POA: Diagnosis not present

## 2020-02-08 DIAGNOSIS — L97822 Non-pressure chronic ulcer of other part of left lower leg with fat layer exposed: Secondary | ICD-10-CM | POA: Insufficient documentation

## 2020-02-08 DIAGNOSIS — L97812 Non-pressure chronic ulcer of other part of right lower leg with fat layer exposed: Secondary | ICD-10-CM | POA: Diagnosis present

## 2020-02-08 DIAGNOSIS — I890 Lymphedema, not elsewhere classified: Secondary | ICD-10-CM | POA: Diagnosis not present

## 2020-02-08 DIAGNOSIS — I5042 Chronic combined systolic (congestive) and diastolic (congestive) heart failure: Secondary | ICD-10-CM | POA: Insufficient documentation

## 2020-02-08 DIAGNOSIS — I252 Old myocardial infarction: Secondary | ICD-10-CM | POA: Diagnosis not present

## 2020-02-08 DIAGNOSIS — I480 Paroxysmal atrial fibrillation: Secondary | ICD-10-CM | POA: Insufficient documentation

## 2020-02-08 DIAGNOSIS — Z86711 Personal history of pulmonary embolism: Secondary | ICD-10-CM | POA: Insufficient documentation

## 2020-02-08 DIAGNOSIS — J449 Chronic obstructive pulmonary disease, unspecified: Secondary | ICD-10-CM | POA: Diagnosis not present

## 2020-02-08 DIAGNOSIS — Z7901 Long term (current) use of anticoagulants: Secondary | ICD-10-CM | POA: Insufficient documentation

## 2020-02-08 DIAGNOSIS — I872 Venous insufficiency (chronic) (peripheral): Secondary | ICD-10-CM | POA: Insufficient documentation

## 2020-02-08 DIAGNOSIS — L97829 Non-pressure chronic ulcer of other part of left lower leg with unspecified severity: Secondary | ICD-10-CM | POA: Diagnosis not present

## 2020-02-11 ENCOUNTER — Ambulatory Visit: Payer: Medicaid Other | Admitting: Family

## 2020-02-11 ENCOUNTER — Telehealth: Payer: Self-pay | Admitting: Family Medicine

## 2020-02-11 NOTE — Telephone Encounter (Signed)
[name] [name] 02/11/2020 Called pt regarding community resource referral received. My info is [phone] please see ref notes for more details. Thank you.  [name] [name] Care Guide, Embedded Care Coordination [facility], Care Management

## 2020-02-12 ENCOUNTER — Other Ambulatory Visit: Payer: Self-pay

## 2020-02-12 ENCOUNTER — Other Ambulatory Visit: Payer: Self-pay | Admitting: *Deleted

## 2020-02-12 ENCOUNTER — Telehealth: Payer: Self-pay | Admitting: Family Medicine

## 2020-02-12 NOTE — Progress Notes (Signed)
TARRELL, DEBES ([id number]) Visit Report for 02/08/2020 Chief Complaint Document Details Patient Name: [name] [name], [name] [name] Date of Service: 02/08/2020 12:15 PM Medical Record Number: [id number] Patient Account Number: [id number] Date of Birth/Sex: 1960/06/27 (59 y.o. M) Treating RN: Cornell Barman Primary Care Provider: Nobie Putnam Other Clinician: Referring Provider: Nobie Putnam Treating Provider/Extender: Skipper Cliche in Treatment: 0 Information Obtained from: Patient Chief Complaint Bilateral LE Lymphedema Electronic Signature(s) Signed: 02/08/2020 12:52:42 PM By: Worthy Keeler PA-C Entered By: Worthy Keeler on 02/08/2020 12:52:41 BEVERLEY, SHERRARD ([id number]) -------------------------------------------------------------------------------- HPI Details Patient Name: [name] [name] Date of Service: 02/08/2020 12:15 PM Medical Record Number: [id number] Patient Account Number: [id number] Date of Birth/Sex: 08/12/1960 (59 y.o. M) Treating RN: Cornell Barman Primary Care Provider: Nobie Putnam Other Clinician: Referring Provider: Nobie Putnam Treating Provider/Extender: Skipper Cliche in Treatment: 0 History of Present Illness HPI Description: 01/18/2019 on evaluation today patient presents for initial evaluation here in our clinic concerning issues he is been having with his bilateral lower extremities. He appears based on what I am seeing to have bilateral lower extremity cellulitis along with chronic venous stasis. He has significant pitting edema even in the bottom of his foot. With that being said he also has a history of hypertension, COPD, congestive heart failure, long-term use of anticoagulants due to atrial fibrillation which is paroxysmal as well as having had a pulmonary embolism in the past. With that being said he tells me that he has constant drainage out of his left lower extremity. This is likely due to the fact that again he  has significant swelling pretty much all the time. With that being said in the past 2 weeks this has been getting worse and he tells me at this point that he feels like it is infected is also hurting a lot more than it has been in the past and the drainage is a different color. The left side is definitely worse than the right. Fortunately there is no signs of active infection systemically although I am concerned due to the severity of the infection that I see in the left lower extremity especially. There is a lot of necrotic tissue on the surface of both wounds that at some point is going to need to be cleaned off to allow these to heal but right now obviously this is much too tender for him. No fevers, chills, nausea, vomiting, or diarrhea. 02/02/2019 on evaluation today patient presents for follow-up concerning the wounds over his bilateral lower extremities. He is continuing to manage. He was in the hospital and has been discharged at this point. He was admitted from 01/21/2019 through 01/27/2019. This was due to cellulitis of lower extremity. Nonetheless he does seem to be doing much better at this point. I did review his discharge summary as well he did not appear to have any positive findings on blood cultures. His white blood cell count was never really elevated either which is also good news. He was treated with IV clindamycin for 4 days and then switch to complete a 10-day course at home which she is still working on at this point. They did have him on IV Lasix in the hospital which helped him diurese well on discharge they stated that his weight was 408 pounds which is still higher than his dry weight of 375 to 380 pounds. Upon discharge his torsemide was increased from 40 to 60 mg twice daily and he is to follow-up with cardiology as well. 12/29-Patient returns at 2 weeks for evaluation  and follow-up of his bilateral lower extremity wounds, with lymphedema, venous ulcerations, patient was  recently hospitalized and was aggressively diuresed after which his torsemide has been increased. He follows up with cardiology as well. His left leg stasis wounds are more moist and have maceration of tissue, the right one is less so. We are using silver cell and unna compression given the degree of edema in the legs. Readmission: 05/04/2019 patient presents today for reevaluation here in the clinic I have not seen him since December 2020. Subsequently this is a fairly sick individual with multiple comorbidities who presents for follow-up concerning wounds of his bilateral lower extremities. He tells me that after he was in the hospital following when we saw him last in December that he ended up subsequently being discharged with home health services and they have been coming out and applying Unna boot wraps. With that being said he has used up all of his home health visits for the year and then for the last week has not had anything on his legs as far as the wrap is concerned. Obviously this is not good and he does come in today with increased swelling or at least what I perceived to be along with somewhat dry wounds again he does not seem to be weeping too much at this point which is at least good news but he has significant lymphedema/venous stasis. No fevers, chills, nausea, vomiting, or diarrhea. 06/01/2019 upon evaluation today patient is seen after not having been seen actually since 19 2021. He kept coming for appointment with every time he would come his temperature would be elevated past the point where we could see him. I told him that he needed to go be seen at the ER during 1 of these times due to the fact that obviously this was a fever of unknown origin at that point and my concern was that it could potentially be his legs but again I was not entirely sure as obviously I could not see them due to the restrictions secondary to Covid even bringing the patient into the clinic. Nonetheless  he finally came in today his temperature again was elevated but not to the same degree that it met the 100.4 or above guidelines of having to cancel the appointment. Either way we did have a look at his legs unfortunately he appears to show signs of cellulitis at this point which is probably where the temperature elevations are emanating from. He has significant lymphedema as well as chronic vent venous stasis as well. When I initially told him that is why I told him to go to the ER previously when his temperature was elevated in case it was something with wounds or otherwise he replied "nobody ever told me to go to the ER". I then subsequently reminded him of me coming to the front desk due to his temperature being elevated and advising him to go to the ER to which she replied "I am not going to the ER and I do not think I need to even with this". Nonetheless I am concerned about the infection of his legs at this point he has significant weeping, significant edema, and overall is not doing very well in my opinion. 4/23; the patient arrives in clinic today having napkin stuck on his wounds. He is taking the antibiotic from last week. Last Wednesday he had his INR checked at the Coumadin clinic it was apparently "sky high" and his Coumadin is now on hold. He is  going back to check this again on Monday. His legs were not put back in compression last week out of fear of the extending infection with fever. He has been taking the antibiotics. Culture from last week showed Enterobacter and methicillin resistant staph aureus. He is on on doxycycline which the MRSA was sensitive to Enterobacter was not plated but given the pattern of sensitivities probably the doxycycline was satisfactory. I will extend the doxycycline another 4 days until he is seen next week. From an infection point of view this looks better 06/14/2019 upon evaluation today patient unfortunately continues to have signs of cellulitis on the  lower extremities bilaterally especially on the left. Unfortunately he doesn't seem to be doing much better despite being on doxycycline for a couple of weeks. He was positive for MRSA as well as Enterobacter. Obviously the MRSA is treated by way of the doxycycline the Enterobacter unfortunately is not I'm going to see about initiating Bactrim for him at this point. 5/13; since he was last here he was hospitalized from 5/7 through 5/11. Mostly this was for congestive heart failure. He has an ejection fraction of 25 to 30%. His torsemide dose was increased. While he was there they dealt with the infection in the right leg. He did not take his Bactrim that he was prescribed last time. He was treated with linezolid in the hospital and cefepime and discharged on Bactrim to complete a 2-week course. He was supposed to follow-up with the Coumadin clinic at his cardiologist's office [Dr. End). He has not done this. He tells me he is on Coumadino 5 mg twice a day. The last INR I see is from 5/11 when he was discharged from the hospital it was 2.1 however it was increasing every day they check this. He was thought to have cellulitis from methicillin-resistant staph aureus. JAYREN, CEASE ([id number]) 5/21; patient's wound on the right lower leg actually looks a lot better although not much change in size. We used Iodoflex under an Haematologist last week. The [employer] stayed in place. 07/20/2019 upon evaluation today patient actually appears to be doing much better in regard to his legs in general. He does not have the infection that was previously noted which is great news he was in the hospital which he finally did go after things got significantly worse. Fortunately he seems to be doing much better in regard to his overall appearance today in that regard. With that being said he still is experiencing issues currently with wounds over the lower extremities and his wraps do tend to slip down. We probably do need  to use the Unna paste which I think we are doing here but I am not sure the home health nurses are using right now we may need to send this order along as well. 07/27/2019 upon evaluation today patient appears to be doing excellent in regard to his lower extremities all things considered. He still has significant edema noted but the compression wraps do seem to be helping and overall after getting his infection under good control I feel like things have improved dramatically. There does not appear to be any signs of active infection at this time. No fevers, chills, nausea, vomiting, or diarrhea. Readmission: 10/30/2019 patient presents today for readmission here in the clinic concerning issues that he has been having with his bilateral lower extremities. He tells me after last time he was in the hospital he actually had improvement of his legs to everything got under much  better control was actually doing quite well. Nonetheless right now he has blisters that reopened he tells me about a month ago and has not really been able to get them close. He does not have any compression stockings that he is wearing at this point he came in really been nothing on the wounds currently. 11/15/2019 on evaluation today patient appears to be doing well with regard to his leg ulcers. He has been tolerating the dressing changes without complication. Fortunately there is no evidence of active infection at this time. 11/22/2019 on evaluation today patient actually is making excellent improvement in regard to his legs at this point. I am actually very pleased with the overall way that he is progressing this go around there is no signs of infection and in general I think he is doing what he needs to to see these wounds heal. 12/21/2019 upon evaluation today patient appears to be doing poorly in regard to his right leg. Unfortunately he is not doing as well simply due to the fact that he left the compression wrap we put on him  October 13 on until today. He states that he has had a bad chest cold and was not able to come in to be seen. With that being said my concern at this point is that he often does not make his appointments. This leads to issues such as this and for today until he can show me that he comes on a regular basis I am not can be willing to rewrap his leg despite the fact his right leg really does need to be rewrapped today. He voiced understanding although he did not seem to be happy about it but again as I explained to him it is dangerous for him to leave the wrap on for 3 to 4 weeks and it actually caused damage to his leg as opposed to helping anyway because it it slid down he did not even take it off in that regard. This makes me reluctant to proceed with wrapping until I can get better compliance from him. Readmission: 02/08/2020 upon evaluation today patient actually presents for reevaluation here in our clinic and appears to be doing very well. He has been in the hospital and subsequently they were using Unna boot wraps there that seems to done extremely well I think coupled with the fact that he was in the bed with his legs elevated which does not happen at home. Has been out of the hospital for a week and seems to have maintained with Unna boot wraps. Overall I think this is doing quite well. He probably does need referral to lymphedema clinic to try to see if we can get a long- term plan for him as far as keeping the edema under control. Unfortunately he is now on 3 L of oxygen he has had a very rough time in the hospital. He had pneumonia and a lot of breathing difficulty. Electronic Signature(s) Signed: 02/08/2020 1:06:26 PM By: Worthy Keeler PA-C Entered By: Worthy Keeler on 02/08/2020 13:06:26 PILAR, CORRALES ([id number]) -------------------------------------------------------------------------------- Physical Exam Details Patient Name: [name] [name] Date of Service: 02/08/2020 12:15  PM Medical Record Number: [id number] Patient Account Number: [id number] Date of Birth/Sex: 1960-12-31 (59 y.o. M) Treating RN: Cornell Barman Primary Care Provider: Nobie Putnam Other Clinician: Referring Provider: Nobie Putnam Treating Provider/Extender: Skipper Cliche in Treatment: 0 Constitutional sitting or standing blood pressure is within target range for patient.. pulse regular and within target range for patient.[name] respirations regular,  non- labored and within target range for patient.[name] temperature within target range for patient.. Obese and well-hydrated in no acute distress. Eyes conjunctiva clear no eyelid edema noted. pupils equal round and reactive to light and accommodation. Ears, Nose, Mouth, and Throat no gross abnormality of ear auricles or external auditory canals. normal hearing noted during conversation. mucus membranes moist. Respiratory normal breathing without difficulty. Cardiovascular Patient has bilateral lower extremity lymphedema. Stage III.[name] Musculoskeletal Patient unable to walk. Psychiatric this patient is able to make decisions and demonstrates good insight into disease process. Alert and Oriented x 3. pleasant and cooperative. Notes Upon inspection patient's legs actually appear to be wound free and he is doing excellent at this time. I do not see any evidence whatsoever of the drainage currently which is great news. I do believe he is getting need a long-term plan however in order to keep his swelling under control and prevent issues ongoing with his legs which have been the case for a number of years now. Electronic Signature(s) Signed: 02/08/2020 1:07:38 PM By: Worthy Keeler PA-C Entered By: Worthy Keeler on 02/08/2020 13:07:37 [name] [name] ([id number]) -------------------------------------------------------------------------------- Physician Orders Details Patient Name: [name] [name] Date of Service: 02/08/2020 12:15  PM Medical Record Number: [id number] Patient Account Number: [id number] Date of Birth/Sex: 28-Dec-1960 (59 y.o. 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 Edema Control o Water engineer) Signed: 02/08/2020 1:58:59 PM By: Worthy Keeler PA-C Signed: 02/12/2020 3:14:13 PM By: Gretta Cool, BSN, RN, CWS, Kim RN, BSN Entered By: Gretta Cool, BSN, RN, CWS, Kim on 02/08/2020 13:06:34 HOWARD, BUNTE ([id number]) -------------------------------------------------------------------------------- Problem List Details Patient Name: [name] [name] Date of Service: 02/08/2020 12:15 PM Medical Record Number: [id number] Patient Account Number: [id number] Date of Birth/Sex: 1960/06/07 (59 y.o. M) Treating RN: Cornell Barman Primary Care Provider: Nobie Putnam Other Clinician: Referring Provider: Nobie Putnam Treating Provider/Extender: Skipper Cliche in Treatment: 0 Active Problems ICD-10 Encounter Code Description Active Date MDM Diagnosis I87.2 Venous insufficiency (chronic) (peripheral) 02/08/2020 No Yes I89.0 Lymphedema, not elsewhere classified 02/08/2020 No Yes I10 Essential (primary) hypertension 02/08/2020 No  Yes J44.9 Chronic obstructive pulmonary disease, unspecified 02/08/2020 No Yes Z79.01 Long term (current) use of anticoagulants 02/08/2020 No Yes I48.0 Paroxysmal atrial fibrillation 02/08/2020 No Yes Z86.711 Personal history of pulmonary embolism 02/08/2020 No Yes I50.42 Chronic combined systolic (congestive) and diastolic (congestive) heart 02/08/2020 No Yes failure E11.622 Type 2 diabetes mellitus with other skin ulcer 02/08/2020 No Yes Inactive Problems Resolved Problems Electronic Signature(s) Signed: 02/08/2020 12:52:25 PM By: Worthy Keeler PA-C Entered By: Worthy Keeler on 02/08/2020 12:52:25 [name] [name] ([id number]) -------------------------------------------------------------------------------- Progress Note Details Patient Name: [name] [name] Date of Service: 02/08/2020 12:15 PM Medical Record Number: [id number] Patient Account Number: [id number] Date of Birth/Sex: May 15, 1960 (59 y.o. M) Treating RN: Cornell Barman Primary Care Provider: Nobie Putnam Other Clinician: Referring Provider: Nobie Putnam Treating Provider/Extender: Skipper Cliche in Treatment: 0 Subjective Chief Complaint Information obtained from Patient Bilateral LE Lymphedema History  of Present Illness (HPI) 01/18/2019 on evaluation today patient presents for initial evaluation here in our clinic concerning issues he is been having with his bilateral lower extremities. He appears based on what I am seeing to have bilateral lower extremity cellulitis along with chronic venous stasis. He has significant pitting edema even in the bottom of his foot. With that being said he also has a history of hypertension, COPD, congestive heart failure, long-term use of anticoagulants due to atrial fibrillation which is paroxysmal as well as having had a pulmonary embolism in the past. With that being said he tells me that he has constant drainage out of his left lower extremity. This is likely due  to the fact that again he has significant swelling pretty much all the time. With that being said in the past 2 weeks this has been getting worse and he tells me at this point that he feels like it is infected is also hurting a lot more than it has been in the past and the drainage is a different color. The left side is definitely worse than the right. Fortunately there is no signs of active infection systemically although I am concerned due to the severity of the infection that I see in the left lower extremity especially. There is a lot of necrotic tissue on the surface of both wounds that at some point is going to need to be cleaned off to allow these to heal but right now obviously this is much too tender for him. No fevers, chills, nausea, vomiting, or diarrhea. 02/02/2019 on evaluation today patient presents for follow-up concerning the wounds over his bilateral lower extremities. He is continuing to manage. He was in the hospital and has been discharged at this point. He was admitted from 01/21/2019 through 01/27/2019. This was due to cellulitis of lower extremity. Nonetheless he does seem to be doing much better at this point. I did review his discharge summary as well he did not appear to have any positive findings on blood cultures. His white blood cell count was never really elevated either which is also good news. He was treated with IV clindamycin for 4 days and then switch to complete a 10-day course at home which she is still working on at this point. They did have him on IV Lasix in the hospital which helped him diurese well on discharge they stated that his weight was 408 pounds which is still higher than his dry weight of 375 to 380 pounds. Upon discharge his torsemide was increased from 40 to 60 mg twice daily and he is to follow-up with cardiology as well. 12/29-Patient returns at 2 weeks for evaluation and follow-up of his bilateral lower extremity wounds, with lymphedema, venous  ulcerations, patient was recently hospitalized and was aggressively diuresed after which his torsemide has been increased. He follows up with cardiology as well. His left leg stasis wounds are more moist and have maceration of tissue, the right one is less so. We are using silver cell and unna compression given the degree of edema in the legs. Readmission: 05/04/2019 patient presents today for reevaluation here in the clinic I have not seen him since December 2020. Subsequently this is a fairly sick individual with multiple comorbidities who presents for follow-up concerning wounds of his bilateral lower extremities. He tells me that after he was in the hospital following when we saw him last in December that he ended up subsequently being discharged with home health services and they have been  coming out and applying Unna boot wraps. With that being said he has used up all of his home health visits for the year and then for the last week has not had anything on his legs as far as the wrap is concerned. Obviously this is not good and he does come in today with increased swelling or at least what I perceived to be along with somewhat dry wounds again he does not seem to be weeping too much at this point which is at least good news but he has significant lymphedema/venous stasis. No fevers, chills, nausea, vomiting, or diarrhea. 06/01/2019 upon evaluation today patient is seen after not having been seen actually since 19 2021. He kept coming for appointment with every time he would come his temperature would be elevated past the point where we could see him. I told him that he needed to go be seen at the ER during 1 of these times due to the fact that obviously this was a fever of unknown origin at that point and my concern was that it could potentially be his legs but again I was not entirely sure as obviously I could not see them due to the restrictions secondary to Covid even bringing the patient into  the clinic. Nonetheless he finally came in today his temperature again was elevated but not to the same degree that it met the 100.4 or above guidelines of having to cancel the appointment. Either way we did have a look at his legs unfortunately he appears to show signs of cellulitis at this point which is probably where the temperature elevations are emanating from. He has significant lymphedema as well as chronic vent venous stasis as well. When I initially told him that is why I told him to go to the ER previously when his temperature was elevated in case it was something with wounds or otherwise he replied "nobody ever told me to go to the ER". I then subsequently reminded him of me coming to the front desk due to his temperature being elevated and advising him to go to the ER to which she replied "I am not going to the ER and I do not think I need to even with this". Nonetheless I am concerned about the infection of his legs at this point he has significant weeping, significant edema, and overall is not doing very well in my opinion. 4/23; the patient arrives in clinic today having napkin stuck on his wounds. He is taking the antibiotic from last week. Last Wednesday he had his INR checked at the Coumadin clinic it was apparently "sky high" and his Coumadin is now on hold. He is going back to check this again on Monday. His legs were not put back in compression last week out of fear of the extending infection with fever. He has been taking the antibiotics. Culture from last week showed Enterobacter and methicillin resistant staph aureus. He is on on doxycycline which the MRSA was sensitive to Enterobacter was not plated but given the pattern of sensitivities probably the doxycycline was satisfactory. I will extend the doxycycline another 4 days until he is seen next week. From an infection point of view this looks better 06/14/2019 upon evaluation today patient unfortunately continues to have signs  of cellulitis on the lower extremities bilaterally especially on the left. Unfortunately he doesn't seem to be doing much better despite being on doxycycline for a couple of weeks. He was positive for MRSA as well as Enterobacter. Obviously  the MRSA is treated by way of the doxycycline the Enterobacter unfortunately is not I'm going to see about initiating Bactrim for him at this point. 5/13; since he was last here he was hospitalized from 5/7 through 5/11. Mostly this was for congestive heart failure. He has an ejection fraction of Murillo, Daschel ([id number]) 25 to 30%. His torsemide dose was increased. While he was there they dealt with the infection in the right leg. He did not take his Bactrim that he was prescribed last time. He was treated with linezolid in the hospital and cefepime and discharged on Bactrim to complete a 2-week course. He was supposed to follow-up with the Coumadin clinic at his cardiologist's office [Dr. End). He has not done this. He tells me he is on Coumadino 5 mg twice a day. The last INR I see is from 5/11 when he was discharged from the hospital it was 2.1 however it was increasing every day they check this. He was thought to have cellulitis from methicillin-resistant staph aureus. 5/21; patient's wound on the right lower leg actually looks a lot better although not much change in size. We used Iodoflex under an Radio broadcast assistant last week. The [employer] stayed in place. 07/20/2019 upon evaluation today patient actually appears to be doing much better in regard to his legs in general. He does not have the infection that was previously noted which is great news he was in the hospital which he finally did go after things got significantly worse. Fortunately he seems to be doing much better in regard to his overall appearance today in that regard. With that being said he still is experiencing issues currently with wounds over the lower extremities and his wraps do tend to slip  down. We probably do need to use the Unna paste which I think we are doing here but I am not sure the home health nurses are using right now we may need to send this order along as well. 07/27/2019 upon evaluation today patient appears to be doing excellent in regard to his lower extremities all things considered. He still has significant edema noted but the compression wraps do seem to be helping and overall after getting his infection under good control I feel like things have improved dramatically. There does not appear to be any signs of active infection at this time. No fevers, chills, nausea, vomiting, or diarrhea. Readmission: 10/30/2019 patient presents today for readmission here in the clinic concerning issues that he has been having with his bilateral lower extremities. He tells me after last time he was in the hospital he actually had improvement of his legs to everything got under much better control was actually doing quite well. Nonetheless right now he has blisters that reopened he tells me about a month ago and has not really been able to get them close. He does not have any compression stockings that he is wearing at this point he came in really been nothing on the wounds currently. 11/15/2019 on evaluation today patient appears to be doing well with regard to his leg ulcers. He has been tolerating the dressing changes without complication. Fortunately there is no evidence of active infection at this time. 11/22/2019 on evaluation today patient actually is making excellent improvement in regard to his legs at this point. I am actually very pleased with the overall way that he is progressing this go around there is no signs of infection and in general I think he is doing what he needs  to to see these wounds heal. 12/21/2019 upon evaluation today patient appears to be doing poorly in regard to his right leg. Unfortunately he is not doing as well simply due to the fact that he left the  compression wrap we put on him October 13 on until today. He states that he has had a bad chest cold and was not able to come in to be seen. With that being said my concern at this point is that he often does not make his appointments. This leads to issues such as this and for today until he can show me that he comes on a regular basis I am not can be willing to rewrap his leg despite the fact his right leg really does need to be rewrapped today. He voiced understanding although he did not seem to be happy about it but again as I explained to him it is dangerous for him to leave the wrap on for 3 to 4 weeks and it actually caused damage to his leg as opposed to helping anyway because it it slid down he did not even take it off in that regard. This makes me reluctant to proceed with wrapping until I can get better compliance from him. Readmission: 02/08/2020 upon evaluation today patient actually presents for reevaluation here in our clinic and appears to be doing very well. He has been in the hospital and subsequently they were using Unna boot wraps there that seems to done extremely well I think coupled with the fact that he was in the bed with his legs elevated which does not happen at home. Has been out of the hospital for a week and seems to have maintained with Unna boot wraps. Overall I think this is doing quite well. He probably does need referral to lymphedema clinic to try to see if we can get a long- term plan for him as far as keeping the edema under control. Unfortunately he is now on 3 L of oxygen he has had a very rough time in the hospital. He had pneumonia and a lot of breathing difficulty. Patient History Information obtained from Patient. Allergies No Known Drug Allergies Family History Cancer - Father,Mother, Heart Disease - Mother,Maternal Grandparents, Hypertension - Maternal Grandparents,Mother, No family history of Diabetes, Hereditary Spherocytosis, Kidney Disease, Lung  Disease, Seizures, Stroke, Thyroid Problems, Tuberculosis. Social History Never smoker, Marital Status - Widowed, Alcohol Use - Rarely, Drug Use - No History, Caffeine Use - Daily. Medical History Ear/Nose/Mouth/Throat Denies history of Chronic sinus problems/congestion, Middle ear problems Hematologic/Lymphatic Denies history of Anemia, Hemophilia, Human Immunodeficiency Virus, Lymphedema, Sickle Cell Disease Respiratory Patient has history of Asthma, Chronic Obstructive Pulmonary Disease (COPD), Sleep Apnea Denies history of Aspiration, Pneumothorax, Tuberculosis Cardiovascular Patient has history of Arrhythmia - a fib, Congestive Heart Failure, Hypertension, Myocardial Infarction - 2010 Denies history of Angina, Coronary Artery Disease, Deep Vein Thrombosis, Hypotension, Peripheral Arterial Disease, Peripheral Venous Disease, Phlebitis, Vasculitis Gastrointestinal Denies history of Cirrhosis , Colitis, Crohn s, Hepatitis A, Hepatitis B, Hepatitis C Endocrine KENI, ELISON ([id number]) Patient has history of Type II Diabetes Denies history of Type I Diabetes Immunological Denies history of Lupus Erythematosus, Raynaud s, Scleroderma Integumentary (Skin) Denies history of History of Burn, History of pressure wounds Musculoskeletal Patient has history of Osteoarthritis Denies history of Gout, Rheumatoid Arthritis, Osteomyelitis Neurologic Denies history of Dementia, Neuropathy, Quadriplegia, Paraplegia, Seizure Disorder Psychiatric Denies history of Anorexia/bulimia, Confinement Anxiety Patient is treated with Controlled Diet, Oral Agents. Blood sugar is not tested. Medical  And Surgical History Notes Respiratory chronic resp failure, PE Musculoskeletal back pain Neurologic TBI Review of Systems (ROS) Constitutional Symptoms (General Health) Denies complaints or symptoms of Fatigue, Fever, Chills, Marked Weight Change. Eyes Denies complaints or symptoms of Dry Eyes,  Vision Changes, Glasses / Contacts. Ear/Nose/Mouth/Throat Denies complaints or symptoms of Difficult clearing ears, Sinusitis. Hematologic/Lymphatic Denies complaints or symptoms of Bleeding / Clotting Disorders, Human Immunodeficiency Virus. Respiratory Complains or has symptoms of Shortness of Breath - 3 lts of nasal cannula O2. Denies complaints or symptoms of Chronic or frequent coughs. Cardiovascular Complains or has symptoms of LE edema. Denies complaints or symptoms of Chest pain. Gastrointestinal Denies complaints or symptoms of Frequent diarrhea, Nausea, Vomiting. Endocrine Denies complaints or symptoms of Hepatitis, Thyroid disease, Polydypsia (Excessive Thirst). Genitourinary Denies complaints or symptoms of Kidney failure/ Dialysis, Incontinence/dribbling. Immunological Denies complaints or symptoms of Hives, Itching. Integumentary (Skin) Complains or has symptoms of Swelling. Denies complaints or symptoms of Wounds, Bleeding or bruising tendency, Breakdown. Musculoskeletal Complains or has symptoms of Muscle Pain, Muscle Weakness. Neurologic Denies complaints or symptoms of Numbness/parasthesias, Focal/Weakness. Psychiatric Complains or has symptoms of Anxiety. Denies complaints or symptoms of Claustrophobia. Objective Constitutional sitting or standing blood pressure is within target range for patient.. pulse regular and within target range for patient.[name] respirations regular, non- labored and within target range for patient.[name] temperature within target range for patient.. Obese and well-hydrated in no acute distress. Vitals Time Taken: 12:35 PM, Temperature: 99.3 F, Pulse: 74 bpm, Respiratory Rate: 22 breaths/min, Blood Pressure: 106/68 mmHg. General Notes: patient on 3 lts of O2 Eyes conjunctiva clear no eyelid edema noted. pupils equal round and reactive to light and accommodation. DIJON, COSENS ([id number]) Ears, Nose, Mouth, and Throat no gross abnormality  of ear auricles or external auditory canals. normal hearing noted during conversation. mucus membranes moist. Respiratory normal breathing without difficulty. Cardiovascular Patient has bilateral lower extremity lymphedema. Stage III.[name] Musculoskeletal Patient unable to walk. Psychiatric this patient is able to make decisions and demonstrates good insight into disease process. Alert and Oriented x 3. pleasant and cooperative. General Notes: Upon inspection patient's legs actually appear to be wound free and he is doing excellent at this time. I do not see any evidence whatsoever of the drainage currently which is great news. I do believe he is getting need a long-term plan however in order to keep his swelling under control and prevent issues ongoing with his legs which have been the case for a number of years now. Other Condition(s) Patient presents with Lymphedema located on the Bilateral Leg. 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 Post procedure Diagnosis Wound #: Same as Pre-Procedure Notes: Patient tolerates unna boots well. Plan Follow-up Appointments: Return Appointment in 1 week. Edema Control: Unna Boots Bilaterally 1. I would recommend currently that we go ahead and initiate a continuation of treatment with the Unna boot wraps which I feel like are doing well for him at this time. His skin seems to be in very good shape. 2. I am also can recommend  he needs to try to elevate his legs much as possible when at home. 3.  I would also recommend that he be referred to the lymphedema clinic we can place this order for him currently. He does have stage III lymphedema he has no open wounds or weeping at this point we have never really been able to get him to this point so that we can get him to the lymphedema clinic but I think this is the ideal time. DEREL, MCGLASSON ([id number]) We will see patient back for reevaluation in 1 week here in the clinic. If anything worsens or changes patient will contact our office for additional recommendations. Electronic Signature(s) Signed: 02/08/2020 1:11:10 PM By: Worthy Keeler PA-C Entered By: Worthy Keeler on 02/08/2020 13:11:10 [name] [name] ([id number]) -------------------------------------------------------------------------------- ROS/PFSH Details Patient Name: [name] [name] Date of Service: 02/08/2020 12:15 PM Medical Record Number: [id number] Patient Account Number: [id number] Date of Birth/Sex: [birth year] (59 y.o. M) Treating RN: Dolan Amen Primary Care Provider: Nobie Putnam Other Clinician: Referring Provider: Nobie Putnam Treating Provider/Extender: Skipper Cliche in Treatment: 0 Information Obtained From Patient Constitutional Symptoms (General Health) Complaints and Symptoms: Negative for: Fatigue; Fever; Chills; Marked Weight Change Eyes Complaints and Symptoms: Negative for: Dry Eyes; Vision Changes; Glasses / Contacts Ear/Nose/Mouth/Throat Complaints and Symptoms: Negative for: Difficult clearing ears; Sinusitis Medical History: Negative for: Chronic sinus problems/congestion; Middle ear problems Hematologic/Lymphatic Complaints and Symptoms: Negative for: Bleeding / Clotting Disorders; Human Immunodeficiency Virus Medical History: Negative for: Anemia; Hemophilia; Human Immunodeficiency Virus; Lymphedema; Sickle Cell  Disease Respiratory Complaints and Symptoms: Positive for: Shortness of Breath - 3 lts of nasal cannula O2 Negative for: Chronic or frequent coughs Medical History: Positive for: Asthma; Chronic Obstructive Pulmonary Disease (COPD); Sleep Apnea Negative for: Aspiration; Pneumothorax; Tuberculosis Past Medical History Notes: chronic resp failure, PE Cardiovascular Complaints and Symptoms: Positive for: LE edema Negative for: Chest pain Medical History: Positive for: Arrhythmia - a fib; Congestive Heart Failure; Hypertension; Myocardial Infarction - 2010 Negative for: Angina; Coronary Artery Disease; Deep Vein Thrombosis; Hypotension; Peripheral Arterial Disease; Peripheral Venous Disease; Phlebitis; Vasculitis Gastrointestinal Complaints and Symptoms: Negative for: Frequent diarrhea; Nausea; Vomiting Medical History: Negative for: Cirrhosis ; Colitis; Crohnos; Hepatitis A; Hepatitis B; Hepatitis C Endocrine GLENNIE, RODDA ([id number]) Complaints and Symptoms: Negative for: Hepatitis; Thyroid disease; Polydypsia (Excessive Thirst) Medical History: Positive for: Type II Diabetes Negative for: Type I Diabetes Treated with: Oral agents, Diet Blood sugar tested every day: No Genitourinary Complaints and Symptoms: Negative for: Kidney failure/ Dialysis; Incontinence/dribbling Immunological Complaints and Symptoms: Negative for: Hives; Itching Medical History: Negative for: Lupus Erythematosus; Raynaudos; Scleroderma Integumentary (Skin) Complaints and Symptoms: Positive for: Swelling Negative for: Wounds; Bleeding or bruising tendency; Breakdown Medical History: Negative for: History of Burn; History of pressure wounds Musculoskeletal Complaints and Symptoms: Positive for: Muscle Pain; Muscle Weakness Medical History: Positive for: Osteoarthritis Negative for: Gout; Rheumatoid Arthritis; Osteomyelitis Past Medical History Notes: back pain Neurologic Complaints and  Symptoms: Negative for: Numbness/parasthesias; Focal/Weakness Medical History: Negative for: Dementia; Neuropathy; Quadriplegia; Paraplegia; Seizure Disorder Past Medical History Notes: TBI Psychiatric Complaints and Symptoms: Positive for: Anxiety Negative for: Claustrophobia Medical History: Negative for: Anorexia/bulimia; Confinement Anxiety Oncologic Immunizations Pneumococcal Vaccine: Received Pneumococcal Vaccination: No Implantable Devices None ASHAZ, ROBLING ([id number]) Family and Social History Cancer: Yes - Father,Mother; Diabetes: No; Heart Disease: Yes - Mother,Maternal Grandparents; Hereditary Spherocytosis: No; Hypertension: Yes - Maternal Grandparents,Mother; Kidney Disease: No; Lung Disease: No; Seizures: No; Stroke: No; Thyroid Problems: No; Tuberculosis: No; Never smoker; Marital Status - Widowed; Alcohol Use: Rarely; Drug Use: No History; Caffeine Use: Daily; Financial Concerns: No; Food, Games developer or Shelter  Needs: No; Support System Lacking: No; Transportation Concerns: No Electronic Signature(s) Signed: 02/08/2020 1:58:59 PM By: Worthy Keeler PA-C Signed: 02/08/2020 2:32:18 PM By: Georges Mouse, Minus Breeding RN Entered By: Georges Mouse, Minus Breeding on 02/08/2020 12:29:35 MUKUND, WEINREB ([id number]) -------------------------------------------------------------------------------- SuperBill Details Patient Name: [name] [name] Date of Service: 02/08/2020 Medical Record Number: [id number] Patient Account Number: [id number] Date of Birth/Sex: [birth year] (59 y.o. M) Treating RN: Cornell Barman Primary Care Provider: Nobie Putnam Other Clinician: Referring Provider: Nobie Putnam Treating Provider/Extender: Skipper Cliche in Treatment: 0 Diagnosis Coding ICD-10 Codes Code Description I87.2 Venous insufficiency (chronic) (peripheral) I89.0 Lymphedema, not elsewhere classified I10 Essential (primary) hypertension J44.9 Chronic obstructive  pulmonary disease, unspecified Z79.01 Long term (current) use of anticoagulants I48.0 Paroxysmal atrial fibrillation Z86.711 Personal history of pulmonary embolism I50.42 Chronic combined systolic (congestive) and diastolic (congestive) heart failure E11.622 Type 2 diabetes mellitus with other skin ulcer Facility Procedures CPT4 Code: 36629476 Description: 54650 - WOUND CARE VISIT-LEV 2 EST PT Modifier: Quantity: 1 CPT4 Code: 35465681 Description: 29580 - APPLY UNNA BOOT/PROFO BILATERAL Modifier: Quantity: 1 Physician Procedures CPT4 Code: 2751700 Description: 99213 - WC PHYS LEVEL 3 - EST PT Modifier: Quantity: 1 CPT4 Code: Description: ICD-10 Diagnosis Description I87.2 Venous insufficiency (chronic) (peripheral) I89.0 Lymphedema, not elsewhere classified I10 Essential (primary) hypertension J44.9 Chronic obstructive pulmonary disease, unspecified Modifier: Quantity: Electronic Signature(s) Signed: 02/08/2020 1:11:29 PM By: Worthy Keeler PA-C Entered By: Worthy Keeler on 02/08/2020 13:11:29

## 2020-02-12 NOTE — Patient Outreach (Signed)
Care Coordination  02/12/2020  [name] [name] 1960-12-27 [id number]   [name] [name] was referred to the High Risk Managed Medicaid Care Team for Care Coordination/Case Management. After meeting with [name] [name] and doing a thorough chart review, it is noted that he is currently receiving these services from the Embedded Team. This RNCM has collaborated with Case Manager currently working with [name] [name] and will defer care to the team that has an established relationship. This writer will be glad to assist as needed.  Estanislado Emms RN, BSN [place]  [employer]

## 2020-02-12 NOTE — Progress Notes (Signed)
SAYED, APOSTOL ([id number]) Visit Report for 02/08/2020 Allergy List Details Patient Name: [name] [name], [name] [name] Date of Service: 02/08/2020 12:15 PM Medical Record Number: [id number] Patient Account Number: [id number] Date of Birth/Sex: 15-Oct-1960 (59 y.o. M) Treating RN: Rogers Blocker Primary Care Marinell Igarashi: Saralyn Pilar Other Clinician: Referring Weslee Fogg: Saralyn Pilar Treating Khristina Janota/Extender: Rowan Blase in Treatment: 0 Allergies Active Allergies No Known Drug Allergies Type: Allergen Allergy Notes Electronic Signature(s) Signed: 02/08/2020 2:32:18 PM By: Phillis Haggis, Dondra Prader RN Entered By: Phillis Haggis, Dondra Prader on 02/08/2020 12:25:22 [name] [name] ([id number]) -------------------------------------------------------------------------------- Arrival Information Details Patient Name: [name] [name] Date of Service: 02/08/2020 12:15 PM Medical Record Number: [id number] Patient Account Number: [id number] Date of Birth/Sex: [birth year] (59 y.o. M) Treating RN: Rogers Blocker Primary Care Grier Vu: Saralyn Pilar Other Clinician: Referring Georgio Hattabaugh: Saralyn Pilar Treating Ladine Kiper/Extender: Rowan Blase in Treatment: 0 Visit Information Patient Arrived: Wheel Chair Arrival Time: 12:24 Accompanied By: Clearnce Sorrel Transfer Assistance: None Patient Identification Verified: Yes Secondary Verification Process Completed: Yes History Since Last Visit Electronic Signature(s) Signed: 02/08/2020 2:32:18 PM By: Phillis Haggis, Dondra Prader RN Entered By: Phillis Haggis, Dondra Prader on 02/08/2020 12:24:56 [name] [name] ([id number]) -------------------------------------------------------------------------------- Clinic Level of Care Assessment Details Patient Name: [name] [name] Date of Service: 02/08/2020 12:15 PM Medical Record Number: [id number] Patient Account Number: [id number] Date of Birth/Sex: [birth year] (59 y.o. M) Treating RN: Huel Coventry Primary Care Oluchi Pucci: Saralyn Pilar Other Clinician: Referring Georgie Eduardo: Saralyn Pilar Treating Benen Weida/Extender: Rowan Blase in Treatment: 0 Clinic Level of Care Assessment Items TOOL 1 Quantity Score []  - Use when EandM and Procedure is performed on INITIAL visit 0 ASSESSMENTS - Nursing Assessment / Reassessment X - General Physical Exam (combine w/ comprehensive assessment (listed just below) when performed on new 1 20 pt. evals) X- 1 25 Comprehensive Assessment (HX, ROS, Risk Assessments, Wounds Hx, etc.) ASSESSMENTS - Wound and Skin Assessment / Reassessment []  - Dermatologic / Skin Assessment (not related to wound area) 0 ASSESSMENTS - Ostomy and/or Continence Assessment and Care []  - Incontinence Assessment and Management 0 []  - 0 Ostomy Care Assessment and Management (repouching, etc.) PROCESS - Coordination of Care X - Simple Patient / Family Education for ongoing care 1 15 []  - 0 Complex (extensive) Patient / Family Education for ongoing care []  - 0 Staff obtains , Records, Test Results / Process Orders []  - 0 Staff telephones HHA, Nursing Homes / Clarify orders / etc []  - 0 Routine Transfer to another Facility (non-emergent condition) []  - 0 Routine Hospital Admission (non-emergent condition) X- 1 15 New Admissions / / Ordering NPWT, Apligraf, etc. []  - 0 Emergency Hospital Admission (emergent condition) PROCESS - Special Needs []  - Pediatric / Minor Patient Management 0 []  - 0 Isolation Patient Management []  - 0 Hearing / Language / Visual special needs []  - 0 Assessment of Community assistance (transportation, D/C planning, etc.) []  - 0 Additional assistance / Altered mentation []  - 0 Support Surface(s) Assessment (bed, cushion, seat, etc.) INTERVENTIONS - Miscellaneous []  - External ear exam 0 []  - 0 Patient Transfer (multiple staff / / Similar devices) []  - 0 Simple Staple /  Suture removal (25 or less) []  - 0 Complex Staple / Suture removal (26 or more) []  - 0 Hypo/Hyperglycemic Management (do not check if billed separately) []  - 0 Ankle / Brachial Index (ABI) - do not check if billed separately Has the patient been seen at the hospital within the last three years: Yes Total Score: 75 Level Of Care: New/Established - Level 2  [name] [name], [name] [name] ([id number]) Electronic Signature(s) Signed: 02/12/2020 3:14:13 PM By: Elliot Gurney, BSN, RN, CWS, Kim RN, BSN Entered By: Elliot Gurney, BSN, RN, CWS, Kim on 02/08/2020 13:07:09 [name] [name], [name] [name] ([id number]) -------------------------------------------------------------------------------- Compression Therapy Details Patient Name: [name] [name] Date of Service: 02/08/2020 12:15 PM Medical Record Number: [id number] Patient Account Number: [id number] Date of Birth/Sex: 10/11/1960 (59 y.o. M) Treating RN: Huel Coventry Primary Care Twan Harkin: Saralyn Pilar Other Clinician: Referring Kedarius Aloisi: Saralyn Pilar Treating Chase Knebel/Extender: Rowan Blase in Treatment: 0 Compression Therapy Performed for Wound Assessment: NonWound Condition Lymphedema - Bilateral Leg Performed By: Clinician Huel Coventry, RN Compression Type: Henriette Combs Post Procedure Diagnosis Same as Pre-procedure Notes Patient tolerates unna boots well Electronic Signature(s) Signed: 02/12/2020 3:14:13 PM By: Elliot Gurney, BSN, RN, CWS, Kim RN, BSN Entered By: Elliot Gurney, BSN, RN, CWS, Kim on 02/08/2020 13:06:02 [name] [name], [name] [name] ([id number]) -------------------------------------------------------------------------------- Encounter Discharge Information Details Patient Name: [name] [name] Date of Service: 02/08/2020 12:15 PM Medical Record Number: [id number] Patient Account Number: [id number] Date of Birth/Sex: October 25, 1960 (59 y.o. M) Treating RN: Huel Coventry Primary Care Rockwell Zentz: Saralyn Pilar Other Clinician: Referring Calisa Luckenbaugh: Saralyn Pilar Treating Ceili Boshers/Extender: Rowan Blase in Treatment: 0 Encounter Discharge Information Items Discharge Condition: Stable Ambulatory Status: Wheelchair Discharge Destination: Home Transportation: Private Auto Accompanied By: self Schedule Follow-up Appointment: Yes Clinical Summary of Care: Electronic Signature(s) Signed: 02/08/2020 2:18:02 PM By: Elliot Gurney, BSN, RN, CWS, Kim RN, BSN Entered By: Elliot Gurney, BSN, RN, CWS, Kim on 02/08/2020 14:18:02 [name] [name] ([id number]) -------------------------------------------------------------------------------- Lower Extremity Assessment Details Patient Name: [name] [name] Date of Service: 02/08/2020 12:15 PM Medical Record Number: [id number] Patient Account Number: [id number] Date of Birth/Sex: 1960/04/19 (59 y.o. M) Treating RN: Rogers Blocker Primary Care Araiyah Cumpton: Saralyn Pilar Other Clinician: Referring Saharsh Sterling: Saralyn Pilar Treating Hans Rusher/Extender: Rowan Blase in Treatment: 0 Edema Assessment Assessed: Kyra Searles: Yes] Franne Forts: Yes] Edema: [Left: Yes] [Right: Yes] Calf Left: Right: Point of Measurement: 26 cm From Medial Instep 54 cm 50.8 cm Ankle Left: Right: Point of Measurement: 11 cm From Medial Instep 31.5 cm 31.5 cm Vascular Assessment Pulses: Dorsalis Pedis Palpable: [Left:No] [Right:No] Doppler Audible: [Left:Yes] [Right:Yes] Posterior Tibial Palpable: [Left:No Yes] [Right:No Yes] Electronic Signature(s) Signed: 02/08/2020 2:32:18 PM By: Phillis Haggis, Dondra Prader RN Entered By: Phillis Haggis, Dondra Prader on 02/08/2020 12:48:47 [name] [name] ([id number]) -------------------------------------------------------------------------------- Multi Wound Chart Details Patient Name: [name] [name] Date of Service: 02/08/2020 12:15 PM Medical Record Number: [id number] Patient Account Number: [id number] Date of Birth/Sex: 04-08-1960 (59 y.o. M) Treating RN: Huel Coventry Primary Care Donnabelle Blanchard:  Saralyn Pilar Other Clinician: Referring Karyna Bessler: Saralyn Pilar Treating Sacha Radloff/Extender: Rowan Blase in Treatment: 0 Vital Signs Height(in): Pulse(bpm): 74 Weight(lbs): Blood Pressure(mmHg): 106/68 Body Mass Index(BMI): Temperature(F): 99.3 Respiratory Rate(breaths/min): 22 Wound Assessments Treatment Notes Electronic Signature(s) Signed: 02/12/2020 3:14:13 PM By: Elliot Gurney, BSN, RN, CWS, Kim RN, BSN Entered By: Elliot Gurney, BSN, RN, CWS, Kim on 02/08/2020 13:05:28 [name] [name], [name] [name] ([id number]) -------------------------------------------------------------------------------- Multi-Disciplinary Care Plan Details Patient Name: [name] [name] Date of Service: 02/08/2020 12:15 PM Medical Record Number: [id number] Patient Account Number: [id number] Date of Birth/Sex: 05-13-1960 (59 y.o. M) Treating RN: Huel Coventry Primary Care Daisi Kentner: Saralyn Pilar Other Clinician: Referring Burlin Mcnair: Saralyn Pilar Treating Gevon Markus/Extender: Rowan Blase in Treatment: 0 Active Inactive Orientation to the Wound Care Program Nursing Diagnoses: Knowledge deficit related to the wound healing center program Goals: Patient/caregiver will verbalize understanding of the Wound Healing Center Program Date Initiated: 02/08/2020 Target Resolution Date: 02/15/2020 Goal Status: Active Interventions: Provide education on orientation to the wound center Notes: Venous Leg Ulcer Nursing Diagnoses: Knowledge deficit related  to disease process and management Goals: Patient will maintain optimal edema control Date Initiated: 02/08/2020 Target Resolution Date: 02/15/2020 Goal Status: Active Interventions: Assess peripheral edema status every visit. Treatment Activities: Therapeutic compression applied : 02/08/2020 Notes: Electronic Signature(s) Signed: 02/12/2020 3:14:13 PM By: Elliot Gurney, BSN, RN, CWS, Kim RN, BSN Entered By: Elliot Gurney, BSN, RN, CWS, Kim on 02/08/2020  13:05:14 [name] [name] ([id number]) -------------------------------------------------------------------------------- Non-Wound Condition Assessment Details Patient Name: [name] [name] Date of Service: 02/08/2020 12:15 PM Medical Record Number: [id number] Patient Account Number: [id number] Date of Birth/Sex: [birth year] (59 y.o. M) Treating RN: Huel Coventry Primary Care Fahmida Jurich: Saralyn Pilar Other Clinician: Referring Rima Blizzard: Saralyn Pilar Treating Brandilee Pies/Extender: Rowan Blase in Treatment: 0 Non-Wound Condition: Condition: Lymphedema Location: Leg Side: Bilateral Notes Unna boots bilaterally for swelling Electronic Signature(s) Signed: 02/12/2020 3:14:13 PM By: Elliot Gurney, BSN, RN, CWS, Kim RN, BSN Entered By: Elliot Gurney, BSN, RN, CWS, Kim on 02/08/2020 13:04:04 [name] [name] ([id number]) -------------------------------------------------------------------------------- Pain Assessment Details Patient Name: [name] [name] Date of Service: 02/08/2020 12:15 PM Medical Record Number: [id number] Patient Account Number: [id number] Date of Birth/Sex: 03-02-1960 (59 y.o. M) Treating RN: Rogers Blocker Primary Care Zane Pellecchia: Saralyn Pilar Other Clinician: Referring Kalynn Declercq: Saralyn Pilar Treating Sheritta Deeg/Extender: Rowan Blase in Treatment: 0 Active Problems Location of Pain Severity and Description of Pain Patient Has Paino Yes Site Locations Pain Location: Generalized Pain Rate the pain. Current Pain Level: 10 Pain Management and Medication Current Pain Management: Notes patient has chronic pain Electronic Signature(s) Signed: 02/08/2020 2:32:18 PM By: Phillis Haggis, Dondra Prader RN Entered By: Phillis Haggis, Dondra Prader on 02/08/2020 12:38:15 [name] [name] ([id number]) -------------------------------------------------------------------------------- Patient/Caregiver Education Details Patient Name: [name] [name] Date of Service:  02/08/2020 12:15 PM Medical Record Number: [id number] Patient Account Number: [id number] Date of Birth/Gender: [birth year] (59 y.o. M) Treating RN: Huel Coventry Primary Care Physician: Saralyn Pilar Other Clinician: Referring Physician: Saralyn Pilar Treating Physician/Extender: Rowan Blase in Treatment: 0 Education Assessment Education Provided To: Patient Education Topics Provided Venous: Handouts: Controlling Swelling with Multilayered Compression Wraps Methods: Demonstration, Explain/Verbal Responses: State content correctly Electronic Signature(s) Signed: 02/12/2020 3:14:13 PM By: Elliot Gurney, BSN, RN, CWS, Kim RN, BSN Entered By: Elliot Gurney, BSN, RN, CWS, Kim on 02/08/2020 13:07:40 [name] [name] ([id number]) -------------------------------------------------------------------------------- Vitals Details Patient Name: [name] [name] Date of Service: 02/08/2020 12:15 PM Medical Record Number: [id number] Patient Account Number: [id number] Date of Birth/Sex: 1960-08-31 (59 y.o. M) Treating RN: Rogers Blocker Primary Care Jerrica Thorman: Saralyn Pilar Other Clinician: Referring Regan Mcbryar: Saralyn Pilar Treating Tyona Nilsen/Extender: Rowan Blase in Treatment: 0 Vital Signs Time Taken: 12:35 Temperature (F): 99.3 Pulse (bpm): 74 Respiratory Rate (breaths/min): 22 Blood Pressure (mmHg): 106/68 Reference Range: 80 - 120 mg / dl Notes patient on 3 lts of O2 Electronic Signature(s) Signed: 02/08/2020 2:32:18 PM By: Phillis Haggis, Dondra Prader RN Entered By: Phillis Haggis, Dondra Prader on 02/08/2020 12:39:27

## 2020-02-12 NOTE — Telephone Encounter (Signed)
[name] [name] 02/12/2020 Pt called asking for the telephone number for the doctors appointment that he has on Monday the 3rd. Documenting call. Thank you.  [name] [name] Care Guide, Embedded Care Coordination [facility], Care Management

## 2020-02-14 ENCOUNTER — Telehealth: Payer: Self-pay | Admitting: Family Medicine

## 2020-02-14 ENCOUNTER — Ambulatory Visit: Payer: Medicaid Other

## 2020-02-14 NOTE — Telephone Encounter (Signed)
Late telephone entry, system went down the day before and could not place call log.  [name] [name] 02/14/2020 Called pt regarding community resource referral received. My info is [phone] please see ref notes for more details. Thank you.  [name] [name] Care Guide, Embedded Care Coordination [facility], Care Management

## 2020-02-14 NOTE — Telephone Encounter (Signed)
[name] [name] 02/14/2020 Called pt regarding community resource referral received. My info is [phone] please see ref notes for more details. Thank you. ° °[name] [name] °Care Guide, Embedded Care Coordination °[place], Care Management ° °

## 2020-02-16 DIAGNOSIS — G4733 Obstructive sleep apnea (adult) (pediatric): Secondary | ICD-10-CM | POA: Diagnosis not present

## 2020-02-18 ENCOUNTER — Ambulatory Visit: Payer: Medicaid Other | Admitting: Student in an Organized Health Care Education/Training Program

## 2020-02-18 ENCOUNTER — Ambulatory Visit: Payer: Medicaid Other | Admitting: Family

## 2020-02-19 ENCOUNTER — Ambulatory Visit: Payer: Medicaid Other | Admitting: Family

## 2020-02-19 ENCOUNTER — Other Ambulatory Visit: Payer: Self-pay

## 2020-02-19 ENCOUNTER — Encounter: Payer: Medicaid Other | Attending: Physician Assistant | Admitting: Physician Assistant

## 2020-02-19 ENCOUNTER — Ambulatory Visit: Payer: Self-pay | Admitting: Licensed Clinical Social Worker

## 2020-02-19 DIAGNOSIS — I890 Lymphedema, not elsewhere classified: Secondary | ICD-10-CM | POA: Insufficient documentation

## 2020-02-19 DIAGNOSIS — E119 Type 2 diabetes mellitus without complications: Secondary | ICD-10-CM | POA: Diagnosis not present

## 2020-02-19 DIAGNOSIS — I872 Venous insufficiency (chronic) (peripheral): Secondary | ICD-10-CM | POA: Diagnosis not present

## 2020-02-19 DIAGNOSIS — L97829 Non-pressure chronic ulcer of other part of left lower leg with unspecified severity: Secondary | ICD-10-CM | POA: Diagnosis not present

## 2020-02-19 DIAGNOSIS — L97819 Non-pressure chronic ulcer of other part of right lower leg with unspecified severity: Secondary | ICD-10-CM | POA: Diagnosis not present

## 2020-02-19 NOTE — Chronic Care Management (AMB) (Signed)
Care Management   Follow Up Note   02/19/2020 Name: [name] [name] MRN: [id number] DOB: 08-19-1960  [name] [name] is enrolled in a Managed Medicaid plan: Yes. Outreach attempt today was successful.   Referred by: [name], DO Reason for referral : Care Coordination   [name] [name] is a 60 y.o. year old male who is a primary care patient of [name], DO. The care management team was consulted for assistance with care management and care coordination needs.    Review of patient status, including review of consultants reports, relevant laboratory and other test results, and collaboration with appropriate care team members and the patient's provider was performed as part of comprehensive patient evaluation and provision of chronic care management services.    Goals Addressed    . SW-Manage My Emotions       Timeframe:  Short-Term Goal Priority:  Medium Start Date:   02/19/20                        Expected End Date:  04/18/20                   Follow Up Date- 90 days from 02/19/20   - begin personal counseling - call and visit an old friend - check out volunteer opportunities - join a support group - laugh; watch a funny movie or comedian - learn and use visualization or guided imagery - perform a random act of kindness - practice relaxation or meditation daily - start or continue a personal journal - talk about feelings with a friend, family or spiritual advisor - practice positive thinking and self-talk    Why is this important?    When you are stressed, down or upset, your body reacts too.   For example, your blood pressure may get higher; you may have a headache or stomachache.   When your emotions get the best of you, your body's ability to fight off cold and flu gets weak.   These steps will help you manage your emotions.     Current Barriers:  . Financial constraints . Limited social support . Level of care concerns . ADL IADL  limitations . Mental Health Concerns  . Limited education about how to apply for disability and for insurance coverage* . Limited access to caregiver . Lacks knowledge of community resource: available transportation resources within his area  Clinical Social Work Clinical Goal(s):  [name] Over the next 120 days, patient/caregiver will work with SW to address concerns related to care coordination needs, lack of a stable support network and lack of Brewing technologist. LCSW will assist patient in gaining additional support in order to maintain health and mental health appropriately  . Over the next 120 days, patient will demonstrate improved adherence to self care as evidenced by implementing healthy self-care into his daily routine such as: attending all medical appointments, deep breathing exercises, taking time for self-reflection, taking medications as prescribed, drinking water and daily exercise to improve mobility and mood.  . Over the next 120 days, patient will work with SW bi-monthly by telephone or in person to reduce or manage symptoms related to stress and anxiety . Over the next 120 days, patient will demonstrate improved health management independence as evidenced by implementing healthy self-care skills and positive support/resource implementation into their daily routine to help cope with stressors and improve overall health and well-being  . Over the next 120 days, patient or caregiver will  verbalize basic understanding of depression/stress process and self health management plan as evidenced by his participation in development of long term plan of care and institution of self health management strategies  Interventions: . Patient interviewed and appropriate assessments performed . Provided mental health counseling with regard to grief as patient lost spouse 2 years ago. Active and reflective listening provided.   . Provided patient with information about the disability  and insurance enrollment process. . Discussed plans with patient for ongoing care management follow up and provided patient with direct contact information for care management team . Advised patient to consider [facility] for [facility] as he has limited in home support other than his sister and neighbor. However, patient reports not having the right form of Medicaid to gain this service.  . Patient confirms stable transportation to all appointments through [facility] transportation. Patient will contact Medicaid transportation this week to set up transportation arrangements for his upcoming PCP appointment on 02/26/20. Patient reports that he has to contact them 3 days in advance and cannot schedule transportation arrangements earlier. CCM LCSW provided positive reinforcement for keeping up with his medical appointments and transportation arrangements.  . Assisted patient/caregiver with obtaining information about health plan benefits . Provided education and assistance to client regarding Advanced Directives. . Provided education to patient regarding level of care options. . Patient main source of support includes his neighbor, sister and sister in law [name]. He reports that his neighbor comes to his residence daily.  . Patient reports that his SOB is still the same. . Patient is still unable to gain HH. The patient has not gotten his new insurance card which is need for [facility] referral. Patient is agreeable to contact them today to inquire. Number provided to patient.   Patient Self Care Activities:  . Attends all scheduled provider appointments . Calls provider office for new concerns or questions  Please see past updates related to this goal by clicking on the "Past Updates" button in the selected goal    Patient Care Plan: General Social Work (Adult)    Problem Identified: Coping Skills (General Plan of Care)     Goal: Coping Skills Enhanced   Note:   Evidence-based guidance:   Acknowledge,  normalize and validate difficulty of making life-long lifestyle changes.   Identify current effective and ineffective coping strategies.   Encourage patient and caregiver participation in care to increase self-esteem, confidence and feelings of control.   Consider alternative and complementary therapy approaches such as meditation, mindfulness or yoga.   Encourage participation in cognitive behavioral therapy to foster a positive identity, increase self-awareness, as well as bolster self-esteem, confidence and self-efficacy.   Discuss spirituality; be present as concerns are identified; encourage journaling, prayer, worship services, meditation or pastoral counseling.   Encourage participation in pleasurable group activities such as hobbies, singing, sports or volunteering).   Encourage the use of mindfulness; refer for training or intensive intervention.   Consider the use of meditative movement therapy such as tai chi, yoga or qigong.   Promote a regular daily exercise program based on tolerance, ability and patient choice to support positive thinking about disease or aging.   Notes:    Task: Support Psychosocial Response to Risk or Actual Health Condition   Note:   Care Management Activities:    - active listening utilized - counseling provided - current coping strategies identified - decision-making supported - healthy lifestyle promoted - journaling promoted - meditative movement therapy encouraged - mindfulness encouraged -  participation in counseling encouraged - problem-solving facilitated - relaxation techniques promoted - self-reflection promoted - spiritual activities promoted - verbalization of feelings encouraged    Notes:      The care management team will reach out to the patient again over the next 90 days.   [name], BSW, MSW, [email or web address] Phone:  [phone]

## 2020-02-19 NOTE — Progress Notes (Addendum)
KEOLA, HENINGER ([id number]) Visit Report for 02/19/2020 Chief Complaint Document Details Patient Name: [name] [name], [name] [name] Date of Service: 02/19/2020 2:45 PM Medical Record Number: [id number] Patient Account Number: [id number] Date of Birth/Sex: 24-May-1960 (60 y.o. M) Treating RN: Cornell Barman Primary Care Provider: Nobie Putnam Other Clinician: Referring Provider: Nobie Putnam Treating Provider/Extender: Skipper Cliche in Treatment: 1 Information Obtained from: Patient Chief Complaint Bilateral LE Lymphedema Electronic Signature(s) Signed: 02/19/2020 2:51:33 PM By: Worthy Keeler PA-C Entered By: Worthy Keeler on 02/19/2020 14:51:32 [name] [name] ([id number]) -------------------------------------------------------------------------------- HPI Details Patient Name: [name] [name] Date of Service: 02/19/2020 2:45 PM Medical Record Number: [id number] Patient Account Number: [id number] Date of Birth/Sex: [birth year] (60 y.o. M) Treating RN: Cornell Barman Primary Care Provider: Nobie Putnam Other Clinician: Referring Provider: Nobie Putnam Treating Provider/Extender: Skipper Cliche in Treatment: 1 History of Present Illness HPI Description: 01/18/2019 on evaluation today patient presents for initial evaluation here in our clinic concerning issues he is been having with his bilateral lower extremities. He appears based on what I am seeing to have bilateral lower extremity cellulitis along with chronic venous stasis. He has significant pitting edema even in the bottom of his foot. With that being said he also has a history of hypertension, COPD, congestive heart failure, long-term use of anticoagulants due to atrial fibrillation which is paroxysmal as well as having had a pulmonary embolism in the past. With that being said he tells me that he has constant drainage out of his left lower extremity. This is likely due to the fact that again he has  significant swelling pretty much all the time. With that being said in the past 2 weeks this has been getting worse and he tells me at this point that he feels like it is infected is also hurting a lot more than it has been in the past and the drainage is a different color. The left side is definitely worse than the right. Fortunately there is no signs of active infection systemically although I am concerned due to the severity of the infection that I see in the left lower extremity especially. There is a lot of necrotic tissue on the surface of both wounds that at some point is going to need to be cleaned off to allow these to heal but right now obviously this is much too tender for him. No fevers, chills, nausea, vomiting, or diarrhea. 02/02/2019 on evaluation today patient presents for follow-up concerning the wounds over his bilateral lower extremities. He is continuing to manage. He was in the hospital and has been discharged at this point. He was admitted from 01/21/2019 through 01/27/2019. This was due to cellulitis of lower extremity. Nonetheless he does seem to be doing much better at this point. I did review his discharge summary as well he did not appear to have any positive findings on blood cultures. His white blood cell count was never really elevated either which is also good news. He was treated with IV clindamycin for 4 days and then switch to complete a 10-day course at home which she is still working on at this point. They did have him on IV Lasix in the hospital which helped him diurese well on discharge they stated that his weight was 408 pounds which is still higher than his dry weight of 375 to 380 pounds. Upon discharge his torsemide was increased from 40 to 60 mg twice daily and he is to follow-up with cardiology as well. 12/29-Patient returns at 2 weeks for evaluation  and follow-up of his bilateral lower extremity wounds, with lymphedema, venous ulcerations, patient was  recently hospitalized and was aggressively diuresed after which his torsemide has been increased. He follows up with cardiology as well. His left leg stasis wounds are more moist and have maceration of tissue, the right one is less so. We are using silver cell and unna compression given the degree of edema in the legs. Readmission: 05/04/2019 patient presents today for reevaluation here in the clinic I have not seen him since December 2020. Subsequently this is a fairly sick individual with multiple comorbidities who presents for follow-up concerning wounds of his bilateral lower extremities. He tells me that after he was in the hospital following when we saw him last in December that he ended up subsequently being discharged with home health services and they have been coming out and applying Unna boot wraps. With that being said he has used up all of his home health visits for the year and then for the last week has not had anything on his legs as far as the wrap is concerned. Obviously this is not good and he does come in today with increased swelling or at least what I perceived to be along with somewhat dry wounds again he does not seem to be weeping too much at this point which is at least good news but he has significant lymphedema/venous stasis. No fevers, chills, nausea, vomiting, or diarrhea. 06/01/2019 upon evaluation today patient is seen after not having been seen actually since 19 2021. He kept coming for appointment with every time he would come his temperature would be elevated past the point where we could see him. I told him that he needed to go be seen at the ER during 1 of these times due to the fact that obviously this was a fever of unknown origin at that point and my concern was that it could potentially be his legs but again I was not entirely sure as obviously I could not see them due to the restrictions secondary to Covid even bringing the patient into the clinic. Nonetheless  he finally came in today his temperature again was elevated but not to the same degree that it met the 100.4 or above guidelines of having to cancel the appointment. Either way we did have a look at his legs unfortunately he appears to show signs of cellulitis at this point which is probably where the temperature elevations are emanating from. He has significant lymphedema as well as chronic vent venous stasis as well. When I initially told him that is why I told him to go to the ER previously when his temperature was elevated in case it was something with wounds or otherwise he replied "nobody ever told me to go to the ER". I then subsequently reminded him of me coming to the front desk due to his temperature being elevated and advising him to go to the ER to which she replied "I am not going to the ER and I do not think I need to even with this". Nonetheless I am concerned about the infection of his legs at this point he has significant weeping, significant edema, and overall is not doing very well in my opinion. 4/23; the patient arrives in clinic today having napkin stuck on his wounds. He is taking the antibiotic from last week. Last Wednesday he had his INR checked at the Coumadin clinic it was apparently "sky high" and his Coumadin is now on hold. He is  going back to check this again on Monday. His legs were not put back in compression last week out of fear of the extending infection with fever. He has been taking the antibiotics. Culture from last week showed Enterobacter and methicillin resistant staph aureus. He is on on doxycycline which the MRSA was sensitive to Enterobacter was not plated but given the pattern of sensitivities probably the doxycycline was satisfactory. I will extend the doxycycline another 4 days until he is seen next week. From an infection point of view this looks better 06/14/2019 upon evaluation today patient unfortunately continues to have signs of cellulitis on the  lower extremities bilaterally especially on the left. Unfortunately he doesn't seem to be doing much better despite being on doxycycline for a couple of weeks. He was positive for MRSA as well as Enterobacter. Obviously the MRSA is treated by way of the doxycycline the Enterobacter unfortunately is not I'm going to see about initiating Bactrim for him at this point. 5/13; since he was last here he was hospitalized from 5/7 through 5/11. Mostly this was for congestive heart failure. He has an ejection fraction of 25 to 30%. His torsemide dose was increased. While he was there they dealt with the infection in the right leg. He did not take his Bactrim that he was prescribed last time. He was treated with linezolid in the hospital and cefepime and discharged on Bactrim to complete a 2-week course. He was supposed to follow-up with the Coumadin clinic at his cardiologist's office [Dr. End). He has not done this. He tells me he is on Coumadino 5 mg twice a day. The last INR I see is from 5/11 when he was discharged from the hospital it was 2.1 however it was increasing every day they check this. He was thought to have cellulitis from methicillin-resistant staph aureus. [name] [name], [name] [name] ([id number]) 5/21; patient's wound on the right lower leg actually looks a lot better although not much change in size. We used Iodoflex under an Haematologist last week. The [employer] stayed in place. 07/20/2019 upon evaluation today patient actually appears to be doing much better in regard to his legs in general. He does not have the infection that was previously noted which is great news he was in the hospital which he finally did go after things got significantly worse. Fortunately he seems to be doing much better in regard to his overall appearance today in that regard. With that being said he still is experiencing issues currently with wounds over the lower extremities and his wraps do tend to slip down. We probably do need  to use the Unna paste which I think we are doing here but I am not sure the home health nurses are using right now we may need to send this order along as well. 07/27/2019 upon evaluation today patient appears to be doing excellent in regard to his lower extremities all things considered. He still has significant edema noted but the compression wraps do seem to be helping and overall after getting his infection under good control I feel like things have improved dramatically. There does not appear to be any signs of active infection at this time. No fevers, chills, nausea, vomiting, or diarrhea. Readmission: 10/30/2019 patient presents today for readmission here in the clinic concerning issues that he has been having with his bilateral lower extremities. He tells me after last time he was in the hospital he actually had improvement of his legs to everything got under much  better control was actually doing quite well. Nonetheless right now he has blisters that reopened he tells me about a month ago and has not really been able to get them close. He does not have any compression stockings that he is wearing at this point he came in really been nothing on the wounds currently. 11/15/2019 on evaluation today patient appears to be doing well with regard to his leg ulcers. He has been tolerating the dressing changes without complication. Fortunately there is no evidence of active infection at this time. 11/22/2019 on evaluation today patient actually is making excellent improvement in regard to his legs at this point. I am actually very pleased with the overall way that he is progressing this go around there is no signs of infection and in general I think he is doing what he needs to to see these wounds heal. 12/21/2019 upon evaluation today patient appears to be doing poorly in regard to his right leg. Unfortunately he is not doing as well simply due to the fact that he left the compression wrap we put on him  October 13 on until today. He states that he has had a bad chest cold and was not able to come in to be seen. With that being said my concern at this point is that he often does not make his appointments. This leads to issues such as this and for today until he can show me that he comes on a regular basis I am not can be willing to rewrap his leg despite the fact his right leg really does need to be rewrapped today. He voiced understanding although he did not seem to be happy about it but again as I explained to him it is dangerous for him to leave the wrap on for 3 to 4 weeks and it actually caused damage to his leg as opposed to helping anyway because it it slid down he did not even take it off in that regard. This makes me reluctant to proceed with wrapping until I can get better compliance from him. Readmission: 02/08/2020 upon evaluation today patient actually presents for reevaluation here in our clinic and appears to be doing very well. He has been in the hospital and subsequently they were using Unna boot wraps there that seems to done extremely well I think coupled with the fact that he was in the bed with his legs elevated which does not happen at home. Has been out of the hospital for a week and seems to have maintained with Unna boot wraps. Overall I think this is doing quite well. He probably does need referral to lymphedema clinic to try to see if we can get a long- term plan for him as far as keeping the edema under control. Unfortunately he is now on 3 L of oxygen he has had a very rough time in the hospital. He had pneumonia and a lot of breathing difficulty. 02/19/2020 upon evaluation today patient appears to be doing well with regard to his leg ulcers. Fortunately there is no signs of active infection at this time which is great news. In general he also has no signs of leg ulceration currently. Mainly which is try to maintain his lymphedema until we can get him into the lymphedema  clinic. We have made that referral but again he has not heard anything from them yet. When looking back in the chart I do not see anything scans I am uncertain as to whether or not we actually  put the referral in to the computer or not. Nonetheless is definitely something we need to follow-up on and be sure. Electronic Signature(s) Signed: 02/19/2020 3:00:22 PM By: Worthy Keeler PA-C Entered By: Worthy Keeler on 02/19/2020 15:00:22 [name] [name], [name] [name] ([id number]) -------------------------------------------------------------------------------- Physical Exam Details Patient Name: [name] [name] Date of Service: 02/19/2020 2:45 PM Medical Record Number: [id number] Patient Account Number: [id number] Date of Birth/Sex: Jun 24, 1960 (60 y.o. M) Treating RN: Cornell Barman Primary Care Provider: Nobie Putnam Other Clinician: Referring Provider: Nobie Putnam Treating Provider/Extender: Skipper Cliche in Treatment: 1 Constitutional Obese and well-hydrated in no acute distress. Respiratory normal breathing without difficulty. Psychiatric this patient is able to make decisions and demonstrates good insight into disease process. Alert and Oriented x 3. pleasant and cooperative. Notes Upon inspection patient's wound bed actually showed signs of being healed there were no open wounds and overall I think his legs are doing well. He does have significant lymphedema and right now I think the biggest things try to keep edema under control so he does not end up with reopening of his wounds. Electronic Signature(s) Signed: 02/19/2020 3:00:41 PM By: Worthy Keeler PA-C Entered By: Worthy Keeler on 02/19/2020 15:00:41 [name] [name] ([id number]) -------------------------------------------------------------------------------- Physician Orders Details Patient Name: [name] [name] Date of Service: 02/19/2020 2:45 PM Medical Record Number: [id number] Patient Account Number: [id number] Date  of Birth/Sex: 04/16/1960 (60 y.o. 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 Bathing/ Shower/ Hygiene o May Shower with wound dressing protected (Water repellent cover e.g., cast protector). Edema Control - Lymphedema / Segmental Compressive Device / Other o Other: - unna boots to bl lat lower extrimities Services and Therapies o Lymphedema Clinic - OT to eval and treat lymphedema bi lat lower extrimities - (ICD10 I89.0 - Lymphedema, not elsewhere classified) Electronic Signature(s) Signed: 02/21/2020 10:09:48 AM By: Worthy Keeler PA-C Signed: 02/22/2020 4:22:48 PM By: Carlene Coria RN Entered By: Carlene Coria on 02/19/2020 16:03:08 [name] [name], [name] [name] ([id number]) -------------------------------------------------------------------------------- Prescription 02/19/2020 Patient Name: [name] [name] Provider: Jeri Cos PA-C Date of Birth: 09/23/1960 NPI#: [id number] Sex: M DEA#: [id number] Phone #: [phone] License #: Patient Address: [facility] [address] [place], [place] [address] [place], [place] [phone] Allergies No  Known Drug Allergies Provider's Orders o Lymphedema Clinic - ICD10: I89.0 - OT to eval and treat lymphedema bi lat lower extrimities Hand Signature: Date(s): Electronic Signature(s) Signed: 02/21/2020 10:09:48 AM By: Worthy Keeler PA-C Signed: 02/22/2020 4:22:48 PM By: Carlene Coria RN Entered By: Carlene Coria on 02/19/2020 16:03:09 [name] [name] ([id number]) --------------------------------------------------------------------------------  Problem List Details Patient Name: [name] [name] Date of Service: 02/19/2020 2:45 PM Medical Record Number: [id number] Patient Account Number: [id number] Date of Birth/Sex: 1960/04/11 (60 y.o. M) Treating RN: Cornell Barman Primary Care Provider: Nobie Putnam Other Clinician: Referring Provider: Nobie Putnam Treating Provider/Extender: Skipper Cliche in Treatment: 1 Active Problems ICD-10 Encounter Code  Description Active Date MDM Diagnosis I87.2 Venous insufficiency (chronic) (peripheral) 02/08/2020 No Yes I89.0 Lymphedema, not elsewhere classified 02/08/2020 No Yes I10 Essential (primary) hypertension 02/08/2020 No Yes J44.9 Chronic obstructive pulmonary disease, unspecified 02/08/2020 No Yes Z79.01 Long term (current) use of anticoagulants 02/08/2020 No Yes I48.0 Paroxysmal atrial fibrillation 02/08/2020 No Yes Z86.711 Personal history of pulmonary embolism 02/08/2020 No Yes I50.42 Chronic combined systolic (congestive) and diastolic (congestive) heart 02/08/2020 No Yes failure E11.622 Type 2 diabetes mellitus with other skin ulcer 02/08/2020 No Yes Inactive Problems Resolved Problems Electronic Signature(s) Signed: 02/19/2020 2:51:26 PM By: Worthy Keeler PA-C Entered By: Worthy Keeler on 02/19/2020 14:51:26 [name] [name] ([id number]) -------------------------------------------------------------------------------- Progress Note Details Patient Name: [name] [name] Date of Service: 02/19/2020 2:45 PM Medical  Record Number: [id number] Patient Account Number: [id number] Date of Birth/Sex: 02/17/1960 (60 y.o. M) Treating RN: Cornell Barman Primary Care Provider: Nobie Putnam Other Clinician: Referring Provider: Nobie Putnam Treating Provider/Extender: Skipper Cliche in Treatment: 1 Subjective Chief Complaint Information obtained from Patient Bilateral LE Lymphedema History of Present Illness (HPI) 01/18/2019 on evaluation today patient presents for initial evaluation here in our clinic concerning issues he is been having with his bilateral lower extremities. He appears based on what I am seeing to have bilateral lower extremity cellulitis along with chronic venous stasis. He has significant pitting edema even in the bottom of his foot. With that being said he also has a history of hypertension, COPD, congestive heart failure, long-term use of anticoagulants due to atrial fibrillation which is paroxysmal as well as having had a pulmonary embolism in the past. With that being said he tells me that he has constant drainage out of his left lower extremity. This is likely due to the fact that again he has significant swelling pretty much all the time. With that being said in the past 2 weeks this has been getting worse and he tells me at this point that he feels like it is infected is also hurting a lot more than it has been in the past and the drainage is a different color. The left side is definitely worse than the right. Fortunately there is no signs of active infection systemically although I am concerned due to the severity of the infection that I see in the left lower extremity especially. There is a lot of necrotic tissue on the surface of both wounds that at some point is going to need to be cleaned off to allow these to heal but right now obviously this is much too tender for him. No fevers, chills, nausea, vomiting, or diarrhea. 02/02/2019 on evaluation today patient presents for  follow-up concerning the wounds over his bilateral lower extremities. He is continuing to manage. He was in the hospital and has been discharged at this point. He was admitted from 01/21/2019 through 01/27/2019. This was due to cellulitis of lower extremity. Nonetheless he does seem to be doing much better at this point. I did review his discharge summary as well he did not appear to have any positive findings on blood cultures. His white blood cell count was never really elevated either which is also good news. He was treated with IV clindamycin for 4 days and then switch to complete a 10-day course at home which she is still working on at this point. They did have him on IV Lasix in the hospital which helped him diurese well on discharge they stated that his weight was 408 pounds which is still higher than his dry  weight of 375 to 380 pounds. Upon discharge his torsemide was increased from 40 to 60 mg twice daily and he is to follow-up with cardiology as well. 12/29-Patient returns at 2 weeks for evaluation and follow-up of his bilateral lower extremity wounds, with lymphedema, venous ulcerations, patient was recently hospitalized and was aggressively diuresed after which his torsemide has been increased. He follows up with cardiology as well. His left leg stasis wounds are more moist and have maceration of tissue, the right one is less so. We are using silver cell and unna compression given the degree of edema in the legs. Readmission: 05/04/2019 patient presents today for reevaluation here in the clinic I have not seen him since December 2020. Subsequently this is a fairly sick individual with multiple comorbidities who presents for follow-up concerning wounds of his bilateral lower extremities. He tells me that after he was in the hospital following when we saw him last in December that he ended up subsequently being discharged with home health services and they have been coming out and applying  Unna boot wraps. With that being said he has used up all of his home health visits for the year and then for the last week has not had anything on his legs as far as the wrap is concerned. Obviously this is not good and he does come in today with increased swelling or at least what I perceived to be along with somewhat dry wounds again he does not seem to be weeping too much at this point which is at least good news but he has significant lymphedema/venous stasis. No fevers, chills, nausea, vomiting, or diarrhea. 06/01/2019 upon evaluation today patient is seen after not having been seen actually since 19 2021. He kept coming for appointment with every time he would come his temperature would be elevated past the point where we could see him. I told him that he needed to go be seen at the ER during 1 of these times due to the fact that obviously this was a fever of unknown origin at that point and my concern was that it could potentially be his legs but again I was not entirely sure as obviously I could not see them due to the restrictions secondary to Covid even bringing the patient into the clinic. Nonetheless he finally came in today his temperature again was elevated but not to the same degree that it met the 100.4 or above guidelines of having to cancel the appointment. Either way we did have a look at his legs unfortunately he appears to show signs of cellulitis at this point which is probably where the temperature elevations are emanating from. He has significant lymphedema as well as chronic vent venous stasis as well. When I initially told him that is why I told him to go to the ER previously when his temperature was elevated in case it was something with wounds or otherwise he replied "nobody ever told me to go to the ER". I then subsequently reminded him of me coming to the front desk due to his temperature being elevated and advising him to go to the ER to which she replied "I am not going to  the ER and I do not think I need to even with this". Nonetheless I am concerned about the infection of his legs at this point he has significant weeping, significant edema, and overall is not doing very well in my opinion. 4/23; the patient arrives in clinic today having napkin stuck on  his wounds. He is taking the antibiotic from last week. Last Wednesday he had his INR checked at the Coumadin clinic it was apparently "sky high" and his Coumadin is now on hold. He is going back to check this again on Monday. His legs were not put back in compression last week out of fear of the extending infection with fever. He has been taking the antibiotics. Culture from last week showed Enterobacter and methicillin resistant staph aureus. He is on on doxycycline which the MRSA was sensitive to Enterobacter was not plated but given the pattern of sensitivities probably the doxycycline was satisfactory. I will extend the doxycycline another 4 days until he is seen next week. From an infection point of view this looks better 06/14/2019 upon evaluation today patient unfortunately continues to have signs of cellulitis on the lower extremities bilaterally especially on the left. Unfortunately he doesn't seem to be doing much better despite being on doxycycline for a couple of weeks. He was positive for MRSA as well as Enterobacter. Obviously the MRSA is treated by way of the doxycycline the Enterobacter unfortunately is not I'm going to see about initiating Bactrim for him at this point. 5/13; since he was last here he was hospitalized from 5/7 through 5/11. Mostly this was for congestive heart failure. He has an ejection fraction of Wik, Sriansh ([id number]) 25 to 30%. His torsemide dose was increased. While he was there they dealt with the infection in the right leg. He did not take his Bactrim that he was prescribed last time. He was treated with linezolid in the hospital and cefepime and discharged on Bactrim to  complete a 2-week course. He was supposed to follow-up with the Coumadin clinic at his cardiologist's office [Dr. End). He has not done this. He tells me he is on Coumadino 5 mg twice a day. The last INR I see is from 5/11 when he was discharged from the hospital it was 2.1 however it was increasing every day they check this. He was thought to have cellulitis from methicillin-resistant staph aureus. 5/21; patient's wound on the right lower leg actually looks a lot better although not much change in size. We used Iodoflex under an Haematologist last week. The [employer] stayed in place. 07/20/2019 upon evaluation today patient actually appears to be doing much better in regard to his legs in general. He does not have the infection that was previously noted which is great news he was in the hospital which he finally did go after things got significantly worse. Fortunately he seems to be doing much better in regard to his overall appearance today in that regard. With that being said he still is experiencing issues currently with wounds over the lower extremities and his wraps do tend to slip down. We probably do need to use the Unna paste which I think we are doing here but I am not sure the home health nurses are using right now we may need to send this order along as well. 07/27/2019 upon evaluation today patient appears to be doing excellent in regard to his lower extremities all things considered. He still has significant edema noted but the compression wraps do seem to be helping and overall after getting his infection under good control I feel like things have improved dramatically. There does not appear to be any signs of active infection at this time. No fevers, chills, nausea, vomiting, or diarrhea. Readmission: 10/30/2019 patient presents today for readmission here in the clinic concerning  issues that he has been having with his bilateral lower extremities. He tells me after last time he was in the  hospital he actually had improvement of his legs to everything got under much better control was actually doing quite well. Nonetheless right now he has blisters that reopened he tells me about a month ago and has not really been able to get them close. He does not have any compression stockings that he is wearing at this point he came in really been nothing on the wounds currently. 11/15/2019 on evaluation today patient appears to be doing well with regard to his leg ulcers. He has been tolerating the dressing changes without complication. Fortunately there is no evidence of active infection at this time. 11/22/2019 on evaluation today patient actually is making excellent improvement in regard to his legs at this point. I am actually very pleased with the overall way that he is progressing this go around there is no signs of infection and in general I think he is doing what he needs to to see these wounds heal. 12/21/2019 upon evaluation today patient appears to be doing poorly in regard to his right leg. Unfortunately he is not doing as well simply due to the fact that he left the compression wrap we put on him October 13 on until today. He states that he has had a bad chest cold and was not able to come in to be seen. With that being said my concern at this point is that he often does not make his appointments. This leads to issues such as this and for today until he can show me that he comes on a regular basis I am not can be willing to rewrap his leg despite the fact his right leg really does need to be rewrapped today. He voiced understanding although he did not seem to be happy about it but again as I explained to him it is dangerous for him to leave the wrap on for 3 to 4 weeks and it actually caused damage to his leg as opposed to helping anyway because it it slid down he did not even take it off in that regard. This makes me reluctant to proceed with wrapping until I can get better compliance  from him. Readmission: 02/08/2020 upon evaluation today patient actually presents for reevaluation here in our clinic and appears to be doing very well. He has been in the hospital and subsequently they were using Unna boot wraps there that seems to done extremely well I think coupled with the fact that he was in the bed with his legs elevated which does not happen at home. Has been out of the hospital for a week and seems to have maintained with Unna boot wraps. Overall I think this is doing quite well. He probably does need referral to lymphedema clinic to try to see if we can get a long- term plan for him as far as keeping the edema under control. Unfortunately he is now on 3 L of oxygen he has had a very rough time in the hospital. He had pneumonia and a lot of breathing difficulty. 02/19/2020 upon evaluation today patient appears to be doing well with regard to his leg ulcers. Fortunately there is no signs of active infection at this time which is great news. In general he also has no signs of leg ulceration currently. Mainly which is try to maintain his lymphedema until we can get him into the lymphedema clinic. We have made  that referral but again he has not heard anything from them yet. When looking back in the chart I do not see anything scans I am uncertain as to whether or not we actually put the referral in to the computer or not. Nonetheless is definitely something we need to follow-up on and be sure. Objective Constitutional Obese and well-hydrated in no acute distress. Vitals Time Taken: 2:35 PM, Temperature: 98.5 F, Pulse: 63 bpm, Respiratory Rate: 18 breaths/min, Blood Pressure: 125/54 mmHg. Respiratory normal breathing without difficulty. Psychiatric this patient is able to make decisions and demonstrates good insight into disease process. Alert and Oriented x 3. pleasant and cooperative. [name] [name], [name] [name] ([id number]) General Notes: Upon inspection patient's wound bed actually  showed signs of being healed there were no open wounds and overall I think his legs are doing well. He does have significant lymphedema and right now I think the biggest things try to keep edema under control so he does not end up with reopening of his wounds. Other Condition(s) Patient presents with Lymphedema located on the Bilateral Leg. 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 Post procedure Diagnosis Wound #: Same as Pre-Procedure Plan Follow-up Appointments: Return Appointment in 1 week. Bathing/ Shower/ Hygiene: May Shower with wound dressing protected (Water repellent cover e.g., cast protector). Edema Control - Lymphedema / Segmental Compressive Device / Other: Other: - unna boots to bl lat lower extrimities Services and Therapies ordered were: Lymphedema Clinic - OT to eval and treat lymphedema bi lat lower extrimities 1. I would recommend currently that we do not continue with the wound care measures as before and the patient is in agreement with the plan. This includes the use of an Unna boot wraps bilaterally to keep the edema under good control. 2. Also can recommend currently that we go ahead and make a referral to lymphedema clinic I thought that we may have done that last time we saw him but I am not certain that referral went through he has not heard from them. For that reason we will get a get in touch and send that referral again. We will see patient back for reevaluation in 1 week here in  the clinic. If anything worsens or changes patient will contact our office for additional recommendations. We are attempting to keep everything under control from the standpoint of the swelling so does not end up with any open wounds until he can get into the lymphedema clinic. Electronic Signature(s) Signed: 02/19/2020 4:21:31 PM By: Worthy Keeler PA-C Previous Signature: 02/19/2020 3:01:22 PM Version By: Worthy Keeler PA-C Entered By: Worthy Keeler on 02/19/2020 16:21:30 [name] [name] ([id number]) -------------------------------------------------------------------------------- SuperBill Details Patient Name: [name] [name] Date of Service: 02/19/2020 Medical Record Number: [id number] Patient Account Number: [id number] Date of Birth/Sex: 1960-06-17 (60 y.o. M) Treating RN: Cornell Barman Primary Care Provider: Nobie Putnam Other Clinician: Referring Provider: Nobie Putnam Treating Provider/Extender: Skipper Cliche in Treatment: 1 Diagnosis Coding ICD-10 Codes Code Description I87.2 Venous insufficiency (chronic) (peripheral) I89.0 Lymphedema, not elsewhere classified I10 Essential (primary) hypertension J44.9 Chronic obstructive pulmonary disease, unspecified Z79.01 Long term (current) use of anticoagulants I48.0 Paroxysmal atrial fibrillation Z86.711 Personal history of pulmonary  embolism I50.42 Chronic combined systolic (congestive) and diastolic (congestive) heart failure E11.622 Type 2 diabetes mellitus with other skin ulcer Facility Procedures CPT4 Code: 93716967 Description: 29580 - APPLY UNNA BOOT/PROFO BILATERAL Modifier: Quantity: 1 Physician Procedures CPT4 Code: 8938101 Description: 99213 - WC PHYS LEVEL 3 - EST PT Modifier: Quantity: 1 CPT4 Code: Description: ICD-10 Diagnosis Description I87.2 Venous insufficiency (chronic) (peripheral) I89.0 Lymphedema, not elsewhere classified I10 Essential (primary) hypertension J44.9 Chronic obstructive  pulmonary disease, unspecified Modifier: Quantity: Electronic Signature(s) Signed: 02/19/2020 5:49:39 PM By: Gretta Cool, BSN, RN, CWS, Kim RN, BSN Signed: 02/21/2020 10:09:48 AM By: Worthy Keeler PA-C Previous Signature: 02/19/2020 4:21:40 PM Version By: Worthy Keeler PA-C Previous Signature: 02/19/2020 3:02:14 PM Version By: Worthy Keeler PA-C Entered By: Gretta Cool, BSN, RN, CWS, Kim on 02/19/2020 17:49:38

## 2020-02-22 NOTE — Progress Notes (Addendum)
FARRELL, PANTALEO ([id number]) Visit Report for 02/19/2020 Arrival Information Details Patient Name: [name] [name], [name] [name] Date of Service: 02/19/2020 2:45 PM Medical Record Number: [id number] Patient Account Number: [id number] Date of Birth/Sex: [birth year] (60 y.o. Male) Treating RN: Yevonne Pax Primary Care Franca Stakes: Saralyn Pilar Other Clinician: Referring Keondria Siever: Saralyn Pilar Treating Mays Paino/Extender: Rowan Blase in Treatment: 1 Visit Information History Since Last Visit All ordered tests and consults were completed: No Patient Arrived: Wheel Chair Added or deleted any medications: No Arrival Time: 14:32 Any new allergies or adverse reactions: No Accompanied By: self Had a fall or experienced change in No Transfer Assistance: None activities of daily living that may affect Patient Identification Verified: Yes risk of falls: Secondary Verification Process Completed: Yes Signs or symptoms of abuse/neglect since last visito No Patient Requires Transmission-Based Precautions: No Hospitalized since last visit: No Patient Has Alerts: No Implantable device outside of the clinic excluding No cellular tissue based products placed in the center since last visit: Has Dressing in Place as Prescribed: Yes Has Compression in Place as Prescribed: Yes Pain Present Now: Yes Electronic Signature(s) Signed: 02/22/2020 4:22:48 PM By: Yevonne Pax RN Entered By: Yevonne Pax on 02/19/2020 14:35:36 [name] [name] ([id number]) -------------------------------------------------------------------------------- Compression Therapy Details Patient Name: [name] [name] Date of Service: 02/19/2020 2:45 PM Medical Record Number: [id number] Patient Account Number: [id number] Date of Birth/Sex: Jan 21, 1961 (59 y.o. Male) Treating RN: Yevonne Pax Primary Care Myan Suit: Saralyn Pilar Other Clinician: Referring Classie Weng: Saralyn Pilar Treating Makenleigh Crownover/Extender: Rowan Blase in Treatment: 1 Compression Therapy Performed for Wound Assessment: NonWound Condition Lymphedema - Bilateral Leg Performed By: Clinician Yevonne Pax, RN Compression Type: Henriette Combs Post Procedure Diagnosis Same as Pre-procedure Electronic Signature(s) Signed: 02/22/2020 4:22:48 PM By: Yevonne Pax RN Entered By: Yevonne Pax on 02/19/2020 14:55:45 [name] [name] ([id number]) -------------------------------------------------------------------------------- Lower Extremity Assessment Details Patient Name: [name] [name] Date of Service: 02/19/2020 2:45 PM Medical Record Number: [id number] Patient Account Number: [id number] Date of Birth/Sex: [birth year] (59 y.o. Male) Treating RN: Yevonne Pax Primary Care Mansa Willers: Saralyn Pilar Other Clinician: Referring Jael Waldorf: Saralyn Pilar Treating Jaydalee Bardwell/Extender: Rowan Blase in Treatment: 1 Edema Assessment Assessed: [Left: No] [Right: No] [Left: Edema] [Right: :] Calf Left: Right: Point of Measurement: From Medial Instep 55 cm 52 cm Ankle Left: Right: Point of Measurement: From Medial Instep 32 cm 33 cm Electronic Signature(s) Signed: 02/22/2020 4:22:48 PM By: Yevonne Pax RN Entered By: Yevonne Pax on 02/19/2020 14:44:40 [name] [name] ([id number]) -------------------------------------------------------------------------------- Multi Wound Chart Details Patient Name: [name] [name] Date of Service: 02/19/2020 2:45 PM Medical Record Number: [id number] Patient Account Number: [id number] Date of Birth/Sex: 04/03/1960 (59 y.o. Male) Treating RN: Yevonne Pax Primary Care Marsean Elkhatib: Saralyn Pilar Other Clinician: Referring Janson Lamar: Saralyn Pilar Treating Welford Christmas/Extender: Rowan Blase in Treatment: 1 Vital Signs Height(in): Pulse(bpm): 63 Weight(lbs): Blood Pressure(mmHg): 125/54 Body Mass Index(BMI): Temperature(F): 98.5 Respiratory Rate(breaths/min): 18 Wound  Assessments Treatment Notes Electronic Signature(s) Signed: 02/22/2020 4:22:48 PM By: Yevonne Pax RN Entered By: Yevonne Pax on 02/19/2020 14:55:23 [name] [name] ([id number]) -------------------------------------------------------------------------------- Multi-Disciplinary Care Plan Details Patient Name: [name] [name] Date of Service: 02/19/2020 2:45 PM Medical Record Number: [id number] Patient Account Number: [id number] Date of Birth/Sex: [birth year] (59 y.o. Male) Treating RN: Yevonne Pax Primary Care Darold Miley: Saralyn Pilar Other Clinician: Referring Graci Hulce: Saralyn Pilar Treating Fount Bahe/Extender: Rowan Blase in Treatment: 1 Active Inactive Electronic Signature(s) Signed: 04/18/2020 4:01:58 PM By: Elliot Gurney, BSN, RN, CWS, Kim RN, BSN Signed: 07/22/2020 8:05:58 AM By: Yevonne Pax RN Previous Signature: 02/22/2020 4:22:48 PM Version By: Yevonne Pax RN Entered By:  Elliot Gurney, BSN, RN, CWS, Kim on 04/18/2020 16:01:58 [name] [name], [name] [name] ([id number]) -------------------------------------------------------------------------------- Non-Wound Condition Assessment Details Patient Name: [name] [name], [name] [name] Date of Service: 02/19/2020 2:45 PM Medical Record Number: [id number] Patient Account Number: [id number] Date of Birth/Sex: 11-21-1960 (59 y.o. Male) Treating RN: Yevonne Pax Primary Care Khalifa Knecht: Saralyn Pilar Other Clinician: Referring Deshone Lyssy: Saralyn Pilar Treating Heather Mckendree/Extender: Rowan Blase in Treatment: 1 Non-Wound Condition: Condition: Lymphedema Location: Leg Side: Bilateral Electronic Signature(s) Signed: 02/22/2020 4:22:48 PM By: Yevonne Pax RN Entered By: Yevonne Pax on 02/19/2020 14:59:25 [name] [name] ([id number]) -------------------------------------------------------------------------------- Pain Assessment Details Patient Name: [name] [name] Date of Service: 02/19/2020 2:45 PM Medical Record Number: [id number] Patient  Account Number: [id number] Date of Birth/Sex: [birth year] (59 y.o. Male) Treating RN: Yevonne Pax Primary Care Gaylan Fauver: Saralyn Pilar Other Clinician: Referring Nation Cradle: Saralyn Pilar Treating Audray Rumore/Extender: Rowan Blase in Treatment: 1 Active Problems Location of Pain Severity and Description of Pain Patient Has Paino Yes Site Locations With Dressing Change: Yes Duration of the Pain. Constant / Intermittento Constant Rate the pain. Current Pain Level: 8 Worst Pain Level: 10 Least Pain Level: 8 Tolerable Pain Level: 5 Character of Pain Describe the Pain: Aching, Burning, Stabbing, Throbbing Pain Management and Medication Current Pain Management: Medication: Yes Cold Application: No Rest: Yes Massage: No Activity: No T.E.N.S.: No Heat Application: No Leg drop or elevation: No Is the Current Pain Management Adequate: Inadequate How does your wound impact your activities of daily livingo Sleep: Yes Bathing: No Appetite: Yes Relationship With Others: No Bladder Continence: No Emotions: No Bowel Continence: No Work: No Toileting: No Drive: No Dressing: No Hobbies: No Electronic Signature(s) Signed: 02/22/2020 4:22:48 PM By: Yevonne Pax RN Entered By: Yevonne Pax on 02/19/2020 14:39:11 [name] [name] ([id number]) -------------------------------------------------------------------------------- Vitals Details Patient Name: [name] [name] Date of Service: 02/19/2020 2:45 PM Medical Record Number: [id number] Patient Account Number: [id number] Date of Birth/Sex: Oct 24, 1960 (60 y.o. Male) Treating RN: Yevonne Pax Primary Care Amos Gaber: Saralyn Pilar Other Clinician: Referring Hansen Carino: Saralyn Pilar Treating Agustine Rossitto/Extender: Rowan Blase in Treatment: 1 Vital Signs Time Taken: 14:35 Temperature (F): 98.5 Pulse (bpm): 63 Respiratory Rate (breaths/min): 18 Blood Pressure (mmHg): 125/54 Reference Range: 80 - 120  mg / dl Electronic Signature(s) Signed: 02/22/2020 4:22:48 PM By: Yevonne Pax RN Entered By: Yevonne Pax on 02/19/2020 14:37:51
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
## 2020-02-25 ENCOUNTER — Ambulatory Visit: Payer: Medicaid Other | Admitting: Family

## 2020-02-25 ENCOUNTER — Ambulatory Visit: Payer: Medicaid Other

## 2020-02-26 ENCOUNTER — Ambulatory Visit (INDEPENDENT_AMBULATORY_CARE_PROVIDER_SITE_OTHER): Payer: Medicaid Other | Admitting: Family Medicine

## 2020-02-26 ENCOUNTER — Encounter: Payer: Self-pay | Admitting: Family Medicine

## 2020-02-26 VITALS — BP 113/69 | HR 111 | Ht 68.0 in | Wt >= 6400 oz

## 2020-02-26 DIAGNOSIS — R42 Dizziness and giddiness: Secondary | ICD-10-CM

## 2020-02-26 MED ORDER — MECLIZINE HCL 25 MG PO TABS: 12.5000 mg | ORAL_TABLET | Freq: Three times a day (TID) | ORAL | 2 refills | Status: DC | PRN

## 2020-02-26 NOTE — Patient Instructions (Addendum)
Thank you for coming to the office today.  1. You have symptoms of Vertigo (Benign Paroxysmal Positional Vertigo) - This is commonly caused by inner ear fluid imbalance, sometimes can be worsened by allergies and sinus symptoms, otherwise it can occur randomly sometimes and we may never discover the exact cause. - To treat this, try the Epley Manuever (see diagrams/instructions below) at home up to 3 times a day for 1-2 weeks or until symptoms resolve - You may take Meclizine as needed up to 3 times a day for dizziness, this will not cure symptoms but may help. Caution may make you drowsy.  If you develop significant worsening episode with vertigo that does not improve and you get severe headache, loss of vision, arm or leg weakness, slurred speech, or other concerning symptoms please seek immediate medical attention at Emergency Department.   For Bariatric Surgery Specialist - Please try to get a list of approved specialists for Bariatric Surgery / Weight Loss - if you let me know who you prefer to see we can work on a referral.  --------------------------   Please schedule a follow-up appointment with Dr Althea Charon within 4 weeks if Vertigo not improving, and will consider Referral to Vestibular Rehab  See the next page for images describing the Epley Manuever.     ----------------------------------------------------------------------------------------------------------------------        Please schedule a Follow-up Appointment to: Return if symptoms worsen or fail to improve.  If you have any other questions or concerns, please feel free to call the office or send a message through MyChart. You may also schedule an earlier appointment if necessary.  Additionally, you may be receiving a survey about your experience at our office within a few days to 1 week by e-mail or mail. We value your feedback.  Saralyn Pilar, DO [facility], [place]
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
## 2020-02-27 ENCOUNTER — Telehealth: Payer: Self-pay | Admitting: *Deleted

## 2020-02-27 ENCOUNTER — Ambulatory Visit: Payer: Medicaid Other | Admitting: Student in an Organized Health Care Education/Training Program

## 2020-02-28 ENCOUNTER — Other Ambulatory Visit: Payer: Self-pay

## 2020-02-28 DIAGNOSIS — I872 Venous insufficiency (chronic) (peripheral): Secondary | ICD-10-CM | POA: Diagnosis not present

## 2020-02-28 NOTE — Progress Notes (Signed)
ARTHURO, CANELO ([id number]) Visit Report for 02/28/2020 Arrival Information Details Patient Name: NAJAE, RATHERT Date of Service: 02/28/2020 2:30 PM Medical Record Number: [id number] Patient Account Number: [id number] Date of Birth/Sex: 09-26-1960 (60 y.o. M) Treating RN: Rogers Blocker Primary Care Alysabeth Scalia: Saralyn Pilar Other Clinician: Referring Zyan Coby: Saralyn Pilar Treating Surafel Hilleary/Extender: Rowan Blase in Treatment: 2 Visit Information History Since Last Visit Pain Present Now: No Patient Arrived: Wheel Chair Arrival Time: 14:30 Accompanied By: self Transfer Assistance: None Patient Identification Verified: Yes Secondary Verification Process Completed: Yes Patient Requires Transmission-Based Precautions: No Patient Has Alerts: No Electronic Signature(s) Signed: 02/28/2020 3:15:58 PM By: Phillis Haggis, Dondra Prader RN Entered By: Phillis Haggis, Dondra Prader on 02/28/2020 15:01:22 [name] ([id number]) -------------------------------------------------------------------------------- Compression Therapy Details Patient Name: [name] Date of Service: 02/28/2020 2:30 PM Medical Record Number: [id number] Patient Account Number: [id number] Date of Birth/Sex: [birth year] (60 y.o. M) Treating RN: Rogers Blocker Primary Care Bayle Calvo: Saralyn Pilar Other Clinician: Referring Ledford Goodson: Saralyn Pilar Treating Dajane Valli/Extender: Rowan Blase in Treatment: 2 Compression Therapy Performed for Wound Assessment: NonWound Condition Lymphedema - Bilateral Leg Performed By: Clinician Rogers Blocker, RN Compression Type: Henriette Combs Notes patient tolerating wraps well Electronic Signature(s) Signed: 02/28/2020 3:15:58 PM By: Phillis Haggis, Dondra Prader RN Entered By: Phillis Haggis, Dondra Prader on 02/28/2020 15:01:52 [name] ([id number]) -------------------------------------------------------------------------------- Encounter Discharge  Information Details Patient Name: [name] Date of Service: 02/28/2020 2:30 PM Medical Record Number: [id number] Patient Account Number: [id number] Date of Birth/Sex: Jun 20, 1960 (60 y.o. M) Treating RN: Rogers Blocker Primary Care Ladarious Kresse: Saralyn Pilar Other Clinician: Referring Skye Plamondon: Saralyn Pilar Treating Kylen Ismael/Extender: Rowan Blase in Treatment: 2 Encounter Discharge Information Items Discharge Condition: Stable Ambulatory Status: Wheelchair Discharge Destination: Home Transportation: Private Auto Accompanied By: self Schedule Follow-up Appointment: Yes Clinical Summary of Care: Electronic Signature(s) Signed: 02/28/2020 3:15:58 PM By: Phillis Haggis, Dondra Prader RN Entered By: Phillis Haggis, Dondra Prader on 02/28/2020 15:04:26

## 2020-02-29 NOTE — Progress Notes (Signed)
HASHEEM, VOLAND ([id number]) Visit Report for 02/28/2020 Physician Orders Details Patient Name: [name], [name] [name] Date of Service: 02/28/2020 2:30 PM Medical Record Number: [id number] Patient Account Number: [id number] Date of Birth/Sex: 1960-03-27 (60 y.o. M) Treating RN: Rogers Blocker Primary Care Provider: Saralyn Pilar Other Clinician: Referring Provider: Saralyn Pilar Treating Provider/Extender: Rowan Blase in Treatment: 2 Verbal / Phone Orders: No Diagnosis Coding Follow-up Appointments o Return Appointment in 1 week. o Nurse Visit as needed Bathing/ Shower/ Hygiene o May shower with wound dressing protected with water repellent cover or cast protector. Edema Control - Lymphedema / Segmental Compressive Device / Other Bilateral Lower Extremities o Elevate legs to the level of the heart and pump ankles as often as possible o Elevate leg(s) parallel to the floor when sitting. o Unna Boot (Left, Right, Bilateral) for Lymphedema Electronic Signature(s) Signed: 02/28/2020 3:15:58 PM By: Phillis Haggis, Dondra Prader RN Signed: 02/29/2020 1:43:02 PM By: Lenda Kelp PA-C Entered By: Phillis Haggis, Dondra Prader on 02/28/2020 15:03:36 [name] [name] ([id number]) -------------------------------------------------------------------------------- SuperBill Details Patient Name: [name] [name] Date of Service: 02/28/2020 Medical Record Number: [id number] Patient Account Number: [id number] Date of Birth/Sex: 1960/12/13 (60 y.o. 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 Electronic Signature(s) Signed: 02/28/2020 3:15:58 PM By: Phillis Haggis, Dondra Prader RN Signed: 02/29/2020 1:43:02 PM By: Lenda Kelp PA-C Entered By: Phillis Haggis, Dondra Prader on 02/28/2020 15:04:36

## 2020-03-03 ENCOUNTER — Ambulatory Visit: Payer: Self-pay | Admitting: Podiatry

## 2020-03-05 ENCOUNTER — Other Ambulatory Visit (HOSPITAL_COMMUNITY): Payer: Self-pay

## 2020-03-05 ENCOUNTER — Ambulatory Visit: Payer: Medicaid Other | Admitting: Family

## 2020-03-05 NOTE — Progress Notes (Signed)
Been working with Iantha Fallen in assisting him to get a recliner.  Showed him 3 pictures that the foundation had approved for him to get.  He chose one and sent it back to [place] to give to [place].  I really think it will help for him to sit in recliner to be able to get his legs up.  Now he sits in an office chair all day and part of night.  He is also wanting assistance with getting new power wheel chair.  Contacted Dr Arlan Organ office for assistance, they are helping with this.  He states his no longer works in reverse and sometime will not turn.  He is dependant on his chair to get around, he can stand and pivot but not able to walk a distance.  He states has all his medications.  Talked about diet, watching high sodium foods and limiting fluids a day.  He does not weight due to unable to stand.  Will continue to visit for heart failure, diet and medication management.   [name] [place] EMT-Paramedic [phone]

## 2020-03-06 DIAGNOSIS — G4733 Obstructive sleep apnea (adult) (pediatric): Secondary | ICD-10-CM | POA: Diagnosis not present

## 2020-03-07 ENCOUNTER — Ambulatory Visit: Payer: Medicaid Other | Admitting: Physician Assistant

## 2020-03-11 ENCOUNTER — Telehealth: Payer: Self-pay

## 2020-03-11 NOTE — Telephone Encounter (Signed)
Copied from CRM [id number]. Topic: General - Other >> Feb 28, 2020  3:34 PM Elliot Gault wrote: Patient was advised by the hospital social worker he is eligible for a new wheel chair, patient weighs 410 and the w/c has to be able to carry someone who weighs 450lb. Patient would like orders sent to Medicaid. Patient would like a follow up call when completed.

## 2020-03-11 NOTE — Telephone Encounter (Signed)
This question was answered recently about 1 week ago, there is a note on 03/05/20 on chart from [name] EMT on community outreach.  I don't see my documentation copied into chart, but essentially the answer for him to get new power wheelchair.  He will need to identify which [employer] Supply Company can cover this for him and is in network.  I was not aware Medicaid covers Power Mobility Devices specifically, my understanding is that he still needs to choose a company - for example, [employer] that would supply the DME equipment.  Usually once a patient chooses a company that is in network, they can request paperwork or orders be sent to their PCP office, once we receive his paperwork or application - he will need a Face to Face in office visit to do a "Mobility Assessment" a specific visit that insurance requires to be documented before they approve any wheelchair orders.  We will send them the copy of the assessment visit and the paperwork / order once we complete it all.  Now - sometimes in past, we have done just a simple hand written order on a rx pad, and fax it to the location patient is requesting, and they may send [place] all of the paperwork.  I am not exactly sure which option he needs.  *Forwarded message to [name] EMT and [place] RN for further assistance on accomplishing this plan*  Note - I will be out of office from 1/31 through 2/8 and return 2/9.  Saralyn Pilar, DO [facility] 03/11/2020, 6:13 PM

## 2020-03-12 ENCOUNTER — Telehealth (HOSPITAL_COMMUNITY): Payer: Self-pay

## 2020-03-12 NOTE — Telephone Encounter (Signed)
Heard back from [place] today and stated [name] [name] needs a face to face with him to be able to send prescription for power wheelchair.  [name] [name] states make the appt, made one for 2/15 at 3:40 at [facility].  He states will set up transportation.  He is aware of HF clinic appt on 2/1 and has scheduled transportation.  He states been doing well.  He received his recliner and loving it.  He states able to get his feet and and much more comfortable than his desk chair.  He is very appreciative of it.  He states has all his medications.  He states he is trying to watch high sodium foods.  He is aware of up coming appts.  Will make a home visit next week.  Will visit for heart failure.   [name] [place] EMT-Paramedic [phone]

## 2020-03-12 NOTE — Telephone Encounter (Signed)
He has to have a face to face visit and has been told this numerous times. The 7 elements have to be supplied.  Medicare "Face to Face" Requirement between Physician and Patient A Face to Face visit is only required for patients with Medicare funding who are requesting a  power wheelchair or scooter. Medicare law requires that patients have a Face to Face  examination by their physician in order to determine if a power mobility device is reasonable and  necessary. A "7-element prescription" is also required. The prescription and face to face  documentation must be sent to the supplier within 45days of the face to face visit.  Who Can Perform Face to Face Examination?  The face to face visit may be done by your primary physician, physician assistant, or a nurse  practitioner.  The 7-element prescription must include the following information on the prescription for  your wheelchair.  f Your name  f Description of the item ordered. (example: power wheelchair/manual wheelchair/scooter)  f Date of completion  f Pertinent diagnosis/conditions that relate to the need for a power mobility device  f Length of need  f Physician signature  f Date of face to face evaluation  Report of the Face to Face Examination should provide objective information rating to the  following. The dictated report should include pertinent information regarding the following:  f Describe your mobility limitation (diagnoses) and how it interferes with your mobility  related activities of daily living (MRADLs) in your home. Medicare defines MRADLs as  bathing, dressing, feeding, grooming, and or toileting in customary locations of the home.  f History and physical examination that includes height and weight.  f Prognosis  f Physical examination with a focus on functional assessment. Is the patient having  difficulty performing ADLs in standing or from whatever current device they are using?  f Past use of a cane, walker,  manual wheelchair, scooter, or power wheelchair.  f Why can't a cane, walker or manual wheelchair meet mobility needs of this patient within  the home?  f The physician must document this need even if he/she refers the patient for a PT/OT  evaluation.  f The physician must keep in mind that Medicare requires that the device must be  necessary for mobility inside the home to complete ADL's. Medicare will not fund  equipment that is needed solely for community use.  Time frames for getting documentation to the supplier:  - Medicare requires that the face to face report and 7-element prescription be provided to  the supplier within 45 days of the date of the face to face visit. Suppliers will request the  face to face documentation from the physicians chart notes/ medical records.  - If the physician refers to a PT or OT for an evaluation, the 45 day time limit starts from  the date the physician signs off on the PT or OT documentation.  - The evaluation by the PT or OT does not take the place of the Face to Face requirement. [facility] [facility] [employer] [phone], fax [phone],  [address], [place] [address], [employer] does accept Medicare and Medicaid pay assignments   [employer] Medicare Supplier/Brent Steffanie Dunn sales person direct number [phone], fax number [phone] , [address] [place], [place] [address],  He needs a physician or nurse practitioner to send him a prescription, demographic info and copy of the  patient's insurance card. Medicare enrolled and may accept medicare assignment. Please check  with the supplier if they accept medicare-approved amount before you get your prescription drugs, equipment or supplies from this supplier.

## 2020-03-13 ENCOUNTER — Encounter (HOSPITAL_COMMUNITY): Payer: Self-pay

## 2020-03-18 ENCOUNTER — Ambulatory Visit: Payer: Medicaid Other | Admitting: Family

## 2020-03-18 DIAGNOSIS — G4733 Obstructive sleep apnea (adult) (pediatric): Secondary | ICD-10-CM | POA: Diagnosis not present

## 2020-03-20 ENCOUNTER — Other Ambulatory Visit (HOSPITAL_COMMUNITY): Payer: Self-pay

## 2020-03-20 NOTE — Progress Notes (Signed)
Had a home appt scheduled, called [rior to going and no answer with no call back.  Will continue to try to visit for heart failure.   [name] [place] EMT-Paramedic [phone]

## 2020-03-21 ENCOUNTER — Other Ambulatory Visit: Payer: Self-pay

## 2020-03-24 ENCOUNTER — Ambulatory Visit: Payer: Medicaid Other | Admitting: Podiatry

## 2020-03-26 ENCOUNTER — Encounter: Payer: Self-pay | Admitting: Family Medicine

## 2020-03-26 ENCOUNTER — Other Ambulatory Visit: Payer: Self-pay

## 2020-03-26 ENCOUNTER — Ambulatory Visit (INDEPENDENT_AMBULATORY_CARE_PROVIDER_SITE_OTHER): Payer: Medicaid Other | Admitting: Family Medicine

## 2020-03-26 VITALS — BP 120/72 | HR 74 | Ht 69.0 in | Wt >= 6400 oz

## 2020-03-26 DIAGNOSIS — G8929 Other chronic pain: Secondary | ICD-10-CM | POA: Diagnosis not present

## 2020-03-26 DIAGNOSIS — M5442 Lumbago with sciatica, left side: Secondary | ICD-10-CM

## 2020-03-26 DIAGNOSIS — R29898 Other symptoms and signs involving the musculoskeletal system: Secondary | ICD-10-CM | POA: Insufficient documentation

## 2020-03-26 DIAGNOSIS — I5032 Chronic diastolic (congestive) heart failure: Secondary | ICD-10-CM

## 2020-03-26 DIAGNOSIS — M5441 Lumbago with sciatica, right side: Secondary | ICD-10-CM

## 2020-03-26 DIAGNOSIS — Z6841 Body Mass Index (BMI) 40.0 and over, adult: Secondary | ICD-10-CM | POA: Diagnosis not present

## 2020-03-26 DIAGNOSIS — M1A9XX0 Chronic gout, unspecified, without tophus (tophi): Secondary | ICD-10-CM | POA: Diagnosis not present

## 2020-03-26 DIAGNOSIS — J432 Centrilobular emphysema: Secondary | ICD-10-CM

## 2020-03-26 NOTE — Progress Notes (Signed)
Subjective:    Patient ID: [name] [name], male    DOB: 06-16-1960, 60 y.o.   MRN: [id number]  [name] [name] is a 60 y.o. male presenting on 03/26/2020 for Mobility Evaluation and Obesity   HPI    Chief Complaint: MOBILITY EXAMINATION   1. Request is for new Power Mobility Device (PMD) - He currently has a power wheelchair device for the past 7 years. He has had current power mobility device wheelchair for past 7 years. He says issue with the controls on Right hand side and difficulty steering due to it moving when he goes to use the controls, also difficulty with reverse function and it is unable to backup well, and the battery will not hold charge. - He has multiple medical conditions that are reason that he needs the PMD. He is currently unable to ambulate due to - morbid obesity weight 410 lbs BMI >60 and severe osteoarthritis and chronic joint pain in lumbar spine, hips, knees, and upper extremity including hands/wrists, he has lower extremity weakness unable to ambulate. Additionally he has shortness of breath due to congestive heart failure. - He is unable to use other mobility devices due to weakness and pain. He cannot manage ambulating on a cane or walker due to reduced upper and lower body strength. He cannot propel himself on manual wheelchair due to weakness and pain in upper and lower extremities. - He is significantly limited and unable to perform most MDRALs due to limited mobility, unable to ambulate without power wheelchair, cannot get to bathroom for bathing and toileting, cannot get to kitchen for cooking and meal prep, cannot get to room and other areas of house for dressing, cannot leave for doctors appointment or shopping   ----------------------------------------------------    Depression screen [facility] 2/9 10/12/2019 06/21/2019 05/10/2019  Decreased Interest 0 3 0  Down, Depressed, Hopeless 2 3 3   PHQ - 2 Score 2 6 3   Altered sleeping 0 1 1  Tired, decreased energy 3 1 3    Change in appetite 0 1 1  Feeling bad or failure about yourself  0 1 1  Trouble concentrating 0 1 1  Moving slowly or fidgety/restless 0 1 0  Suicidal thoughts 0 1 0  PHQ-9 Score 5 13 10   Difficult doing work/chores Very difficult Very difficult Very difficult  Some recent data might be hidden    Social History   Tobacco Use  . Smoking status: Never Smoker  . Smokeless tobacco: Never Used  Vaping Use  . Vaping Use: Never used  Substance Use Topics  . Alcohol use: Yes  . Drug use: Never    Review of Systems Per HPI unless specifically indicated above     Objective:    BP 120/72   Pulse 74   Ht 5\' 9"  (1.753 m)   Wt (!) 410 lb (186 kg)   SpO2 90%   BMI 60.55 kg/m   Wt Readings from Last 3 Encounters:  03/26/20 (!) 410 lb (186 kg)  02/26/20 (!) 410 lb (186 kg)  02/02/20 (!) 423 lb 12.8 oz (192.2 kg)    Physical Exam Vitals and nursing note reviewed.  Constitutional:      General: He is not in acute distress.    Appearance: He is well-developed. He is obese. He is not diaphoretic.     Comments: Well-appearing, comfortable, cooperative  HENT:     Head: Normocephalic and atraumatic.  Eyes:     General:  Right eye: No discharge.        Left eye: No discharge.     Conjunctiva/sclera: Conjunctivae normal.  Neck:     Thyroid: No thyromegaly.  Cardiovascular:     Rate and Rhythm: Normal rate and regular rhythm.     Heart sounds: Normal heart sounds. No murmur heard.   Pulmonary:     Effort: Pulmonary effort is normal. No respiratory distress.     Breath sounds: No wheezing or rales.     Comments: Some reduced air movement without overt wheezing. Musculoskeletal:     Cervical back: Normal range of motion and neck supple.     Right lower leg: Edema present.     Left lower leg: Edema present.     Comments: In power wheelchair  Lymphadenopathy:     Cervical: No cervical adenopathy.  Skin:    General: Skin is warm and dry.     Findings: No erythema or  rash.  Neurological:     Mental Status: He is alert and oriented to person, place, and time.  Psychiatric:        Behavior: Behavior normal.     Comments: Well groomed, good eye contact, normal speech and thoughts     Height = 5\' 9" , Weight = 410 lbs He will need Wheel Chair to support up to 500 lbs   -  O2 Saturation = 90% on 4L supplemental oxygen - Edema = positive for some edema in bilateral lower extremity - Pressure Sores = None actively   - Cardiac - Known peripheral vascular disease, heart disease with CHF, he has dyspnea and edema, treated with diuretics - Pulmonary - Known COPD with reduced air movement and has supplemental oxygen therapy on 4L - Musculoskeletal - bilateral lower extremity weakness due to chronic pain and osteoarthritis and weakness due to obesity - Ambulatory Exam - Unable to be performed. Patient is non-ambulatory.   Upper and Lower Extremity Assessment RUE - Strength - 4 out of 5 - Pain - 4 out of 10 - Range of Motion - Mostly full range   LUE - Strength - 4 out of 5 - Pain - 3 out of 10 - Range of Motion - mostly full range   RLE - Strength 1 out of 5 - Pain - 7 out of 10 - Range of Motion - unable to flex hip or dorsiflex toes. Dramatic reduced range of motion in hip knee and foot   LLE - Strength - 2 out of 5 - Pain - 7 out of 10 - Range of Motion - able to flex knee partially and is unable to lift left foot dorsiflexion   - Gait Pattern: Unable to assess. Non ambulatory  Results for orders placed or performed during the hospital encounter of 01/27/20  Resp Panel by RT-PCR (Flu A&B, Covid) Nasopharyngeal Swab   Specimen: Nasopharyngeal Swab; Nasopharyngeal(NP) swabs in vial transport medium  Result Value Ref Range   SARS Coronavirus 2 by RT PCR NEGATIVE NEGATIVE   Influenza A by PCR NEGATIVE NEGATIVE   Influenza B by PCR NEGATIVE NEGATIVE  MRSA PCR Screening   Specimen: Nasopharyngeal  Result Value Ref Range   MRSA by PCR NEGATIVE  NEGATIVE  CBC with Differential  Result Value Ref Range   WBC 8.4 4.0 - 10.5 K/uL   RBC 4.09 (L) 4.22 - 5.81 MIL/uL   Hemoglobin 12.0 (L) 13.0 - 17.0 g/dL   HCT 16.137.8 (L) 09.639.0 - 04.552.0 %   MCV 92.4 80.0 -  100.0 fL   MCH 29.3 26.0 - 34.0 pg   MCHC 31.7 30.0 - 36.0 g/dL   RDW 26.8 (H) 34.1 - 96.2 %   Platelets 296 150 - 400 K/uL   nRBC 0.0 0.0 - 0.2 %   Neutrophils Relative % 71 %   Neutro Abs 5.9 1.7 - 7.7 K/uL   Lymphocytes Relative 14 %   Lymphs Abs 1.2 0.7 - 4.0 K/uL   Monocytes Relative 11 %   Monocytes Absolute 0.9 0.1 - 1.0 K/uL   Eosinophils Relative 2 %   Eosinophils Absolute 0.2 0.0 - 0.5 K/uL   Basophils Relative 1 %   Basophils Absolute 0.1 0.0 - 0.1 K/uL   Immature Granulocytes 1 %   Abs Immature Granulocytes 0.05 0.00 - 0.07 K/uL  Comprehensive metabolic panel  Result Value Ref Range   Sodium 136 135 - 145 mmol/L   Potassium 3.7 3.5 - 5.1 mmol/L   Chloride 94 (L) 98 - 111 mmol/L   CO2 25 22 - 32 mmol/L   Glucose, Bld 172 (H) 70 - 99 mg/dL   BUN 6 6 - 20 mg/dL   Creatinine, Ser 2.29 0.61 - 1.24 mg/dL   Calcium 7.8 (L) 8.9 - 10.3 mg/dL   Total Protein 7.2 6.5 - 8.1 g/dL   Albumin 3.3 (L) 3.5 - 5.0 g/dL   AST 57 (H) 15 - 41 U/L   ALT 31 0 - 44 U/L   Alkaline Phosphatase 107 38 - 126 U/L   Total Bilirubin 1.0 0.3 - 1.2 mg/dL   GFR, Estimated >79 >89 mL/min   Anion gap 17 (H) 5 - 15  Brain natriuretic peptide  Result Value Ref Range   B Natriuretic Peptide 279.8 (H) 0.0 - 100.0 pg/mL  Protime-INR  Result Value Ref Range   Prothrombin Time 14.9 11.4 - 15.2 seconds   INR 1.2 0.8 - 1.2  Ethanol  Result Value Ref Range   Alcohol, Ethyl (B) <10 <10 mg/dL  Magnesium  Result Value Ref Range   Magnesium 1.5 (L) 1.7 - 2.4 mg/dL  Phosphorus  Result Value Ref Range   Phosphorus 2.3 (L) 2.5 - 4.6 mg/dL  HIV Antibody (routine testing w rflx)  Result Value Ref Range   HIV Screen 4th Generation wRfx Non Reactive Non Reactive  Hemoglobin A1c  Result Value Ref Range    Hgb A1c MFr Bld 6.7 (H) 4.8 - 5.6 %   Mean Plasma Glucose 145.59 mg/dL  TSH  Result Value Ref Range   TSH 12.805 (H) 0.350 - 4.500 uIU/mL  Basic metabolic panel  Result Value Ref Range   Sodium 135 135 - 145 mmol/L   Potassium 3.6 3.5 - 5.1 mmol/L   Chloride 90 (L) 98 - 111 mmol/L   CO2 34 (H) 22 - 32 mmol/L   Glucose, Bld 123 (H) 70 - 99 mg/dL   BUN 8 6 - 20 mg/dL   Creatinine, Ser 2.11 0.61 - 1.24 mg/dL   Calcium 7.7 (L) 8.9 - 10.3 mg/dL   GFR, Estimated >94 >17 mL/min   Anion gap 11 5 - 15  Protime-INR  Result Value Ref Range   Prothrombin Time 14.9 11.4 - 15.2 seconds   INR 1.2 0.8 - 1.2  Basic metabolic panel  Result Value Ref Range   Sodium 135 135 - 145 mmol/L   Potassium 3.6 3.5 - 5.1 mmol/L   Chloride 91 (L) 98 - 111 mmol/L   CO2 32 22 - 32 mmol/L  Glucose, Bld 121 (H) 70 - 99 mg/dL   BUN 8 6 - 20 mg/dL   Creatinine, Ser 1.61 0.61 - 1.24 mg/dL   Calcium 7.6 (L) 8.9 - 10.3 mg/dL   GFR, Estimated >09 >60 mL/min   Anion gap 12 5 - 15  Protime-INR  Result Value Ref Range   Prothrombin Time 17.0 (H) 11.4 - 15.2 seconds   INR 1.4 (H) 0.8 - 1.2  CBC  Result Value Ref Range   WBC 5.1 4.0 - 10.5 K/uL   RBC 3.63 (L) 4.22 - 5.81 MIL/uL   Hemoglobin 11.0 (L) 13.0 - 17.0 g/dL   HCT 45.4 (L) 09.8 - 11.9 %   MCV 93.9 80.0 - 100.0 fL   MCH 30.3 26.0 - 34.0 pg   MCHC 32.3 30.0 - 36.0 g/dL   RDW 14.7 (H) 82.9 - 56.2 %   Platelets 160 150 - 400 K/uL   nRBC 0.4 (H) 0.0 - 0.2 %  Glucose, capillary  Result Value Ref Range   Glucose-Capillary 125 (H) 70 - 99 mg/dL  Glucose, capillary  Result Value Ref Range   Glucose-Capillary 133 (H) 70 - 99 mg/dL  Glucose, capillary  Result Value Ref Range   Glucose-Capillary 123 (H) 70 - 99 mg/dL  Basic metabolic panel  Result Value Ref Range   Sodium 136 135 - 145 mmol/L   Potassium 3.4 (L) 3.5 - 5.1 mmol/L   Chloride 92 (L) 98 - 111 mmol/L   CO2 33 (H) 22 - 32 mmol/L   Glucose, Bld 129 (H) 70 - 99 mg/dL   BUN 8 6 - 20 mg/dL    Creatinine, Ser 1.30 0.61 - 1.24 mg/dL   Calcium 7.6 (L) 8.9 - 10.3 mg/dL   GFR, Estimated >86 >57 mL/min   Anion gap 11 5 - 15  Protime-INR  Result Value Ref Range   Prothrombin Time 22.2 (H) 11.4 - 15.2 seconds   INR 2.0 (H) 0.8 - 1.2  CBC  Result Value Ref Range   WBC 4.8 4.0 - 10.5 K/uL   RBC 3.55 (L) 4.22 - 5.81 MIL/uL   Hemoglobin 10.8 (L) 13.0 - 17.0 g/dL   HCT 84.6 (L) 96.2 - 95.2 %   MCV 94.9 80.0 - 100.0 fL   MCH 30.4 26.0 - 34.0 pg   MCHC 32.0 30.0 - 36.0 g/dL   RDW 84.1 (H) 32.4 - 40.1 %   Platelets 153 150 - 400 K/uL   nRBC 0.0 0.0 - 0.2 %  Glucose, capillary  Result Value Ref Range   Glucose-Capillary 124 (H) 70 - 99 mg/dL  Glucose, capillary  Result Value Ref Range   Glucose-Capillary 119 (H) 70 - 99 mg/dL   Comment 1 Notify RN    Comment 2 Document in Chart   Glucose, capillary  Result Value Ref Range   Glucose-Capillary 138 (H) 70 - 99 mg/dL   Comment 1 Notify RN    Comment 2 Document in Chart   Glucose, capillary  Result Value Ref Range   Glucose-Capillary 133 (H) 70 - 99 mg/dL   Comment 1 Notify RN    Comment 2 Document in Chart   Basic metabolic panel  Result Value Ref Range   Sodium 134 (L) 135 - 145 mmol/L   Potassium 3.5 3.5 - 5.1 mmol/L   Chloride 90 (L) 98 - 111 mmol/L   CO2 34 (H) 22 - 32 mmol/L   Glucose, Bld 123 (H) 70 - 99 mg/dL   BUN 8  6 - 20 mg/dL   Creatinine, Ser 8.14 0.61 - 1.24 mg/dL   Calcium 8.2 (L) 8.9 - 10.3 mg/dL   GFR, Estimated >48 >18 mL/min   Anion gap 10 5 - 15  Protime-INR  Result Value Ref Range   Prothrombin Time 24.6 (H) 11.4 - 15.2 seconds   INR 2.3 (H) 0.8 - 1.2  CBC  Result Value Ref Range   WBC 4.4 4.0 - 10.5 K/uL   RBC 3.97 (L) 4.22 - 5.81 MIL/uL   Hemoglobin 12.1 (L) 13.0 - 17.0 g/dL   HCT 56.3 (L) 14.9 - 70.2 %   MCV 95.7 80.0 - 100.0 fL   MCH 30.5 26.0 - 34.0 pg   MCHC 31.8 30.0 - 36.0 g/dL   RDW 63.7 (H) 85.8 - 85.0 %   Platelets 160 150 - 400 K/uL   nRBC 0.0 0.0 - 0.2 %  Magnesium  Result  Value Ref Range   Magnesium 1.9 1.7 - 2.4 mg/dL  Glucose, capillary  Result Value Ref Range   Glucose-Capillary 129 (H) 70 - 99 mg/dL   Comment 1 Notify RN    Comment 2 Document in Chart   Glucose, capillary  Result Value Ref Range   Glucose-Capillary 136 (H) 70 - 99 mg/dL  Glucose, capillary  Result Value Ref Range   Glucose-Capillary 115 (H) 70 - 99 mg/dL  Glucose, capillary  Result Value Ref Range   Glucose-Capillary 141 (H) 70 - 99 mg/dL  Basic metabolic panel  Result Value Ref Range   Sodium 135 135 - 145 mmol/L   Potassium 3.5 3.5 - 5.1 mmol/L   Chloride 90 (L) 98 - 111 mmol/L   CO2 34 (H) 22 - 32 mmol/L   Glucose, Bld 153 (H) 70 - 99 mg/dL   BUN 7 6 - 20 mg/dL   Creatinine, Ser 2.77 0.61 - 1.24 mg/dL   Calcium 9.1 8.9 - 41.2 mg/dL   GFR, Estimated >87 >86 mL/min   Anion gap 11 5 - 15  Protime-INR  Result Value Ref Range   Prothrombin Time 25.3 (H) 11.4 - 15.2 seconds   INR 2.4 (H) 0.8 - 1.2  CBC  Result Value Ref Range   WBC 5.5 4.0 - 10.5 K/uL   RBC 4.13 (L) 4.22 - 5.81 MIL/uL   Hemoglobin 12.5 (L) 13.0 - 17.0 g/dL   HCT 76.7 20.9 - 47.0 %   MCV 95.2 80.0 - 100.0 fL   MCH 30.3 26.0 - 34.0 pg   MCHC 31.8 30.0 - 36.0 g/dL   RDW 96.2 (H) 83.6 - 62.9 %   Platelets 175 150 - 400 K/uL   nRBC 0.0 0.0 - 0.2 %  Glucose, capillary  Result Value Ref Range   Glucose-Capillary 135 (H) 70 - 99 mg/dL  Brain natriuretic peptide  Result Value Ref Range   B Natriuretic Peptide 221.2 (H) 0.0 - 100.0 pg/mL  Glucose, capillary  Result Value Ref Range   Glucose-Capillary 140 (H) 70 - 99 mg/dL  Glucose, capillary  Result Value Ref Range   Glucose-Capillary 117 (H) 70 - 99 mg/dL  Glucose, capillary  Result Value Ref Range   Glucose-Capillary 187 (H) 70 - 99 mg/dL  Protime-INR  Result Value Ref Range   Prothrombin Time 21.3 (H) 11.4 - 15.2 seconds   INR 1.9 (H) 0.8 - 1.2  CBC  Result Value Ref Range   WBC 4.8 4.0 - 10.5 K/uL   RBC 3.55 (L) 4.22 - 5.81 MIL/uL  Hemoglobin 10.7 (L) 13.0 - 17.0 g/dL   HCT 38.1 (L) 82.9 - 93.7 %   MCV 94.9 80.0 - 100.0 fL   MCH 30.1 26.0 - 34.0 pg   MCHC 31.8 30.0 - 36.0 g/dL   RDW 16.9 (H) 67.8 - 93.8 %   Platelets 153 150 - 400 K/uL   nRBC 0.0 0.0 - 0.2 %  Basic metabolic panel  Result Value Ref Range   Sodium 137 135 - 145 mmol/L   Potassium 3.8 3.5 - 5.1 mmol/L   Chloride 93 (L) 98 - 111 mmol/L   CO2 36 (H) 22 - 32 mmol/L   Glucose, Bld 126 (H) 70 - 99 mg/dL   BUN 11 6 - 20 mg/dL   Creatinine, Ser 1.01 0.61 - 1.24 mg/dL   Calcium 9.0 8.9 - 75.1 mg/dL   GFR, Estimated >02 >58 mL/min   Anion gap 8 5 - 15  Glucose, capillary  Result Value Ref Range   Glucose-Capillary 132 (H) 70 - 99 mg/dL  Glucose, capillary  Result Value Ref Range   Glucose-Capillary 133 (H) 70 - 99 mg/dL  Glucose, capillary  Result Value Ref Range   Glucose-Capillary 118 (H) 70 - 99 mg/dL  CBG monitoring, ED  Result Value Ref Range   Glucose-Capillary 136 (H) 70 - 99 mg/dL  CBG monitoring, ED  Result Value Ref Range   Glucose-Capillary 121 (H) 70 - 99 mg/dL  CBG monitoring, ED  Result Value Ref Range   Glucose-Capillary 138 (H) 70 - 99 mg/dL  CBG monitoring, ED  Result Value Ref Range   Glucose-Capillary 115 (H) 70 - 99 mg/dL  CBG monitoring, ED  Result Value Ref Range   Glucose-Capillary 130 (H) 70 - 99 mg/dL  CBG monitoring, ED  Result Value Ref Range   Glucose-Capillary 119 (H) 70 - 99 mg/dL  Troponin I (High Sensitivity)  Result Value Ref Range   Troponin I (High Sensitivity) 62 (H) <18 ng/L  Troponin I (High Sensitivity)  Result Value Ref Range   Troponin I (High Sensitivity) 68 (H) <18 ng/L  Troponin I (High Sensitivity)  Result Value Ref Range   Troponin I (High Sensitivity) 91 (H) <18 ng/L  Troponin I (High Sensitivity)  Result Value Ref Range   Troponin I (High Sensitivity) 37 (H) <18 ng/L      Assessment & Plan:   Problem List Items Addressed This Visit    Weakness of both lower extremities -  Primary   Morbid obesity with BMI of 60.0-69.9, adult (HCC)   Chronic low back pain (Bilateral)  w/ sciatica (Bilateral) (Chronic)   Chronic gout involving toe, unspecified cause, unspecified laterality (Chronic)   Chronic diastolic CHF (congestive heart failure) (HCC)   Centrilobular emphysema (HCC)      Medical Conditions: - The primary medical condition that impacts patient's mobility need is severe morbid obesity BMI >60 with weight 410 lbs and lower extremity R>L weakness due to obesity and chronic pain and osteoarthritis, limiting his range of motion and function/strength of lower legs. Additional medical conditions include COPD emphysema and CHF heart failure limiting his breathing, he remains on supplemental oxygen.  MRADLs impaired in the home include: - PMD is necessary for patient's mobility to get to the bathroom for routine toilet use. - PMD is necessary for patient's mobility to get to the kitchen to prepare meals - PMD is necessary for patient's mobility to get to the bedroom to dress and sleep - PMD is necessary for patient's mobility  to leave the home to go to doctors appointments or to any other location including grocery store   Gilmer Mor or Dan Humphreys / Media planner / Scooter He is unable to use other mobility devices due to weakness and pain. He cannot manage ambulating on a cane or walker due to reduced upper and lower body strength. He cannot propel himself on manual wheelchair due to weakness and pain in upper and lower extremities. He has limited postural stability and is unable to balance on a scooter. His weakness as listed above in both upper and lower extremities limit his ability to use these devices.    He has used power wheelchair device in past with success. Patient can safely operate the power mobility device physically. He is mentally capable to operate the device.   Patient is willing and motivated to use the power mobility device in his home to improve his  quality of life.   It is my medical opinion he would benefit MDRAL function with the assistance of a power mobility device.  No orders of the defined types were placed in this encounter.     Follow up plan: Return in about 4 weeks (around 04/23/2020) for 2-4 weeks for virtual visit for additional referrals for pain / obesity.   Saralyn Pilar, DO [facility] 03/26/2020, 3:48 PM

## 2020-03-26 NOTE — Patient Instructions (Addendum)
Thank you for coming to the office today.  We will fax the order and documentation to   [facility]. [address] [place], [place] [address] Ph: [phone]  Stay tuned for updates.  Please schedule a Follow-up Appointment to: Return in about 4 weeks (around 04/23/2020) for 2-4 weeks for virtual visit for additional referrals for pain / obesity.  If you have any other questions or concerns, please feel free to call the office or send a message through MyChart. You may also schedule an earlier appointment if necessary.  Additionally, you may be receiving a survey about your experience at our office within a few days to 1 week by e-mail or mail. We value your feedback.  Saralyn Pilar, DO [facility], [place]

## 2020-04-01 ENCOUNTER — Ambulatory Visit: Payer: Medicaid Other | Admitting: Family Medicine

## 2020-04-01 ENCOUNTER — Ambulatory Visit: Payer: Medicaid Other | Admitting: Occupational Therapy

## 2020-04-03 ENCOUNTER — Encounter: Payer: Self-pay | Admitting: Family Medicine

## 2020-04-03 NOTE — Progress Notes (Signed)
Received order form / paperwork from [facility] today regarding PMD device.  Signed all forms completed diagnosis and ready to fax back to [facility] tomorrow 04/04/20.  Saralyn Pilar, DO [facility] 04/03/2020, 3:10 PM

## 2020-04-06 ENCOUNTER — Emergency Department: Payer: Medicaid Other

## 2020-04-06 ENCOUNTER — Inpatient Hospital Stay: Payer: Medicaid Other

## 2020-04-06 ENCOUNTER — Other Ambulatory Visit: Payer: Self-pay

## 2020-04-06 ENCOUNTER — Inpatient Hospital Stay
Admission: EM | Admit: 2020-04-06 | Discharge: 2020-04-28 | DRG: 177 | Disposition: A | Discharge status: Home Health | Payer: Medicaid Other | Attending: Internal Medicine | Admitting: Internal Medicine

## 2020-04-06 ENCOUNTER — Encounter: Payer: Self-pay | Admitting: Pulmonary Disease

## 2020-04-06 DIAGNOSIS — J9811 Atelectasis: Secondary | ICD-10-CM | POA: Diagnosis not present

## 2020-04-06 DIAGNOSIS — J9601 Acute respiratory failure with hypoxia: Secondary | ICD-10-CM | POA: Diagnosis not present

## 2020-04-06 DIAGNOSIS — Z452 Encounter for adjustment and management of vascular access device: Secondary | ICD-10-CM

## 2020-04-06 DIAGNOSIS — I5043 Acute on chronic combined systolic (congestive) and diastolic (congestive) heart failure: Secondary | ICD-10-CM | POA: Diagnosis present

## 2020-04-06 DIAGNOSIS — Z8249 Family history of ischemic heart disease and other diseases of the circulatory system: Secondary | ICD-10-CM

## 2020-04-06 DIAGNOSIS — R0602 Shortness of breath: Secondary | ICD-10-CM | POA: Diagnosis not present

## 2020-04-06 DIAGNOSIS — I469 Cardiac arrest, cause unspecified: Secondary | ICD-10-CM | POA: Diagnosis not present

## 2020-04-06 DIAGNOSIS — E1165 Type 2 diabetes mellitus with hyperglycemia: Secondary | ICD-10-CM | POA: Diagnosis not present

## 2020-04-06 DIAGNOSIS — G894 Chronic pain syndrome: Secondary | ICD-10-CM | POA: Diagnosis present

## 2020-04-06 DIAGNOSIS — R069 Unspecified abnormalities of breathing: Secondary | ICD-10-CM | POA: Diagnosis not present

## 2020-04-06 DIAGNOSIS — I4819 Other persistent atrial fibrillation: Secondary | ICD-10-CM | POA: Diagnosis present

## 2020-04-06 DIAGNOSIS — E871 Hypo-osmolality and hyponatremia: Secondary | ICD-10-CM | POA: Diagnosis present

## 2020-04-06 DIAGNOSIS — Z6841 Body Mass Index (BMI) 40.0 and over, adult: Secondary | ICD-10-CM | POA: Diagnosis not present

## 2020-04-06 DIAGNOSIS — J9690 Respiratory failure, unspecified, unspecified whether with hypoxia or hypercapnia: Secondary | ICD-10-CM | POA: Diagnosis not present

## 2020-04-06 DIAGNOSIS — J1282 Pneumonia due to coronavirus disease 2019: Secondary | ICD-10-CM | POA: Diagnosis not present

## 2020-04-06 DIAGNOSIS — R778 Other specified abnormalities of plasma proteins: Secondary | ICD-10-CM

## 2020-04-06 DIAGNOSIS — R000 Tachycardia, unspecified: Secondary | ICD-10-CM | POA: Diagnosis not present

## 2020-04-06 DIAGNOSIS — J90 Pleural effusion, not elsewhere classified: Secondary | ICD-10-CM | POA: Diagnosis not present

## 2020-04-06 DIAGNOSIS — Z86711 Personal history of pulmonary embolism: Secondary | ICD-10-CM

## 2020-04-06 DIAGNOSIS — A419 Sepsis, unspecified organism: Secondary | ICD-10-CM | POA: Diagnosis not present

## 2020-04-06 DIAGNOSIS — I2699 Other pulmonary embolism without acute cor pulmonale: Secondary | ICD-10-CM | POA: Diagnosis not present

## 2020-04-06 DIAGNOSIS — Z4659 Encounter for fitting and adjustment of other gastrointestinal appliance and device: Secondary | ICD-10-CM

## 2020-04-06 DIAGNOSIS — M1990 Unspecified osteoarthritis, unspecified site: Secondary | ICD-10-CM | POA: Diagnosis present

## 2020-04-06 DIAGNOSIS — D696 Thrombocytopenia, unspecified: Secondary | ICD-10-CM | POA: Diagnosis present

## 2020-04-06 DIAGNOSIS — Z7901 Long term (current) use of anticoagulants: Secondary | ICD-10-CM

## 2020-04-06 DIAGNOSIS — L89313 Pressure ulcer of right buttock, stage 3: Secondary | ICD-10-CM | POA: Diagnosis present

## 2020-04-06 DIAGNOSIS — J9602 Acute respiratory failure with hypercapnia: Secondary | ICD-10-CM | POA: Diagnosis not present

## 2020-04-06 DIAGNOSIS — Z7982 Long term (current) use of aspirin: Secondary | ICD-10-CM

## 2020-04-06 DIAGNOSIS — D649 Anemia, unspecified: Secondary | ICD-10-CM | POA: Diagnosis present

## 2020-04-06 DIAGNOSIS — I252 Old myocardial infarction: Secondary | ICD-10-CM

## 2020-04-06 DIAGNOSIS — E662 Morbid (severe) obesity with alveolar hypoventilation: Secondary | ICD-10-CM | POA: Diagnosis present

## 2020-04-06 DIAGNOSIS — I509 Heart failure, unspecified: Secondary | ICD-10-CM | POA: Diagnosis not present

## 2020-04-06 DIAGNOSIS — M109 Gout, unspecified: Secondary | ICD-10-CM | POA: Diagnosis present

## 2020-04-06 DIAGNOSIS — D6859 Other primary thrombophilia: Secondary | ICD-10-CM | POA: Diagnosis present

## 2020-04-06 DIAGNOSIS — N390 Urinary tract infection, site not specified: Secondary | ICD-10-CM | POA: Diagnosis present

## 2020-04-06 DIAGNOSIS — K219 Gastro-esophageal reflux disease without esophagitis: Secondary | ICD-10-CM | POA: Diagnosis present

## 2020-04-06 DIAGNOSIS — Z86718 Personal history of other venous thrombosis and embolism: Secondary | ICD-10-CM

## 2020-04-06 DIAGNOSIS — T502X5A Adverse effect of carbonic-anhydrase inhibitors, benzothiadiazides and other diuretics, initial encounter: Secondary | ICD-10-CM | POA: Diagnosis not present

## 2020-04-06 DIAGNOSIS — U071 COVID-19: Principal | ICD-10-CM

## 2020-04-06 DIAGNOSIS — K760 Fatty (change of) liver, not elsewhere classified: Secondary | ICD-10-CM | POA: Diagnosis not present

## 2020-04-06 DIAGNOSIS — J811 Chronic pulmonary edema: Secondary | ICD-10-CM | POA: Diagnosis not present

## 2020-04-06 DIAGNOSIS — Z9981 Dependence on supplemental oxygen: Secondary | ICD-10-CM

## 2020-04-06 DIAGNOSIS — Z801 Family history of malignant neoplasm of trachea, bronchus and lung: Secondary | ICD-10-CM

## 2020-04-06 DIAGNOSIS — G4733 Obstructive sleep apnea (adult) (pediatric): Secondary | ICD-10-CM | POA: Diagnosis not present

## 2020-04-06 DIAGNOSIS — I5023 Acute on chronic systolic (congestive) heart failure: Secondary | ICD-10-CM | POA: Diagnosis not present

## 2020-04-06 DIAGNOSIS — I110 Hypertensive heart disease with heart failure: Secondary | ICD-10-CM | POA: Diagnosis present

## 2020-04-06 DIAGNOSIS — E876 Hypokalemia: Secondary | ICD-10-CM | POA: Diagnosis not present

## 2020-04-06 DIAGNOSIS — J9622 Acute and chronic respiratory failure with hypercapnia: Secondary | ICD-10-CM | POA: Diagnosis present

## 2020-04-06 DIAGNOSIS — M25511 Pain in right shoulder: Secondary | ICD-10-CM | POA: Diagnosis not present

## 2020-04-06 DIAGNOSIS — R7989 Other specified abnormal findings of blood chemistry: Secondary | ICD-10-CM

## 2020-04-06 DIAGNOSIS — I4891 Unspecified atrial fibrillation: Secondary | ICD-10-CM

## 2020-04-06 DIAGNOSIS — E039 Hypothyroidism, unspecified: Secondary | ICD-10-CM | POA: Diagnosis present

## 2020-04-06 DIAGNOSIS — J810 Acute pulmonary edema: Secondary | ICD-10-CM

## 2020-04-06 DIAGNOSIS — Z4682 Encounter for fitting and adjustment of non-vascular catheter: Secondary | ICD-10-CM | POA: Diagnosis not present

## 2020-04-06 DIAGNOSIS — I700 Atherosclerosis of aorta: Secondary | ICD-10-CM | POA: Diagnosis not present

## 2020-04-06 DIAGNOSIS — I428 Other cardiomyopathies: Secondary | ICD-10-CM | POA: Diagnosis present

## 2020-04-06 DIAGNOSIS — J069 Acute upper respiratory infection, unspecified: Secondary | ICD-10-CM

## 2020-04-06 DIAGNOSIS — R9431 Abnormal electrocardiogram [ECG] [EKG]: Secondary | ICD-10-CM | POA: Diagnosis not present

## 2020-04-06 DIAGNOSIS — J9621 Acute and chronic respiratory failure with hypoxia: Secondary | ICD-10-CM | POA: Diagnosis present

## 2020-04-06 DIAGNOSIS — I248 Other forms of acute ischemic heart disease: Secondary | ICD-10-CM | POA: Diagnosis present

## 2020-04-06 DIAGNOSIS — Z978 Presence of other specified devices: Secondary | ICD-10-CM

## 2020-04-06 DIAGNOSIS — R092 Respiratory arrest: Secondary | ICD-10-CM | POA: Diagnosis present

## 2020-04-06 DIAGNOSIS — J15212 Pneumonia due to Methicillin resistant Staphylococcus aureus: Secondary | ICD-10-CM | POA: Diagnosis present

## 2020-04-06 DIAGNOSIS — E785 Hyperlipidemia, unspecified: Secondary | ICD-10-CM | POA: Diagnosis present

## 2020-04-06 DIAGNOSIS — J9600 Acute respiratory failure, unspecified whether with hypoxia or hypercapnia: Secondary | ICD-10-CM

## 2020-04-06 DIAGNOSIS — I2510 Atherosclerotic heart disease of native coronary artery without angina pectoris: Secondary | ICD-10-CM | POA: Diagnosis present

## 2020-04-06 DIAGNOSIS — E873 Alkalosis: Secondary | ICD-10-CM | POA: Diagnosis not present

## 2020-04-06 DIAGNOSIS — J432 Centrilobular emphysema: Secondary | ICD-10-CM

## 2020-04-06 DIAGNOSIS — R1110 Vomiting, unspecified: Secondary | ICD-10-CM

## 2020-04-06 DIAGNOSIS — L8990 Pressure ulcer of unspecified site, unspecified stage: Secondary | ICD-10-CM | POA: Diagnosis present

## 2020-04-06 DIAGNOSIS — Z79890 Hormone replacement therapy (postmenopausal): Secondary | ICD-10-CM

## 2020-04-06 DIAGNOSIS — M25519 Pain in unspecified shoulder: Secondary | ICD-10-CM

## 2020-04-06 DIAGNOSIS — E878 Other disorders of electrolyte and fluid balance, not elsewhere classified: Secondary | ICD-10-CM | POA: Diagnosis present

## 2020-04-06 DIAGNOSIS — Z79899 Other long term (current) drug therapy: Secondary | ICD-10-CM

## 2020-04-06 DIAGNOSIS — Z87820 Personal history of traumatic brain injury: Secondary | ICD-10-CM

## 2020-04-06 DIAGNOSIS — R0902 Hypoxemia: Secondary | ICD-10-CM | POA: Diagnosis not present

## 2020-04-06 DIAGNOSIS — I10 Essential (primary) hypertension: Secondary | ICD-10-CM | POA: Diagnosis not present

## 2020-04-06 DIAGNOSIS — I517 Cardiomegaly: Secondary | ICD-10-CM | POA: Diagnosis not present

## 2020-04-06 DIAGNOSIS — R0689 Other abnormalities of breathing: Secondary | ICD-10-CM | POA: Diagnosis not present

## 2020-04-06 LAB — BLOOD GAS, VENOUS
Acid-base deficit: 5.4 mmol/L — ABNORMAL HIGH (ref 0.0–2.0)
Bicarbonate: 22.6 mmol/L (ref 20.0–28.0)
O2 Saturation: 90.1 %
Patient temperature: 37
pCO2, Ven: 54 mmHg (ref 44.0–60.0)
pH, Ven: 7.23 — ABNORMAL LOW (ref 7.250–7.430)
pO2, Ven: 70 mmHg — ABNORMAL HIGH (ref 32.0–45.0)

## 2020-04-06 LAB — URINALYSIS, COMPLETE (UACMP) WITH MICROSCOPIC
Bilirubin Urine: NEGATIVE
Glucose, UA: 50 mg/dL — AB
Ketones, ur: 5 mg/dL — AB
Nitrite: NEGATIVE
Protein, ur: >=300 mg/dL — AB
RBC / HPF: >50 RBC/hpf — ABNORMAL HIGH (ref 0–5)
Specific Gravity, Urine: 1.025 (ref 1.005–1.030)
pH: 5 (ref 5.0–8.0)

## 2020-04-06 LAB — COMPREHENSIVE METABOLIC PANEL
ALT: 48 U/L — ABNORMAL HIGH (ref 0–44)
AST: 144 U/L — ABNORMAL HIGH (ref 15–41)
Albumin: 3.5 g/dL (ref 3.5–5.0)
Alkaline Phosphatase: 99 U/L (ref 38–126)
Anion gap: 17 — ABNORMAL HIGH (ref 5–15)
BUN: 27 mg/dL — ABNORMAL HIGH (ref 6–20)
CO2: 24 mmol/L (ref 22–32)
Calcium: 7.5 mg/dL — ABNORMAL LOW (ref 8.9–10.3)
Chloride: 88 mmol/L — ABNORMAL LOW (ref 98–111)
Creatinine, Ser: 1.07 mg/dL (ref 0.61–1.24)
GFR, Estimated: >60 mL/min (ref >60)
Glucose, Bld: 170 mg/dL — ABNORMAL HIGH (ref 70–99)
Potassium: 3.8 mmol/L (ref 3.5–5.1)
Sodium: 129 mmol/L — ABNORMAL LOW (ref 135–145)
Total Bilirubin: 1.4 mg/dL — ABNORMAL HIGH (ref 0.3–1.2)
Total Protein: 7.6 g/dL (ref 6.5–8.1)

## 2020-04-06 LAB — CBC WITH DIFFERENTIAL/PLATELET
Abs Immature Granulocytes: 0.04 10*3/uL (ref 0.00–0.07)
Basophils Absolute: 0 10*3/uL (ref 0.0–0.1)
Basophils Relative: 0 %
Eosinophils Absolute: 0 10*3/uL (ref 0.0–0.5)
Eosinophils Relative: 0 %
HCT: 38.9 % — ABNORMAL LOW (ref 39.0–52.0)
Hemoglobin: 12.4 g/dL — ABNORMAL LOW (ref 13.0–17.0)
Immature Granulocytes: 1 %
Lymphocytes Relative: 4 %
Lymphs Abs: 0.3 10*3/uL — ABNORMAL LOW (ref 0.7–4.0)
MCH: 30.8 pg (ref 26.0–34.0)
MCHC: 31.9 g/dL (ref 30.0–36.0)
MCV: 96.8 fL (ref 80.0–100.0)
Monocytes Absolute: 0.6 10*3/uL (ref 0.1–1.0)
Monocytes Relative: 7 %
Neutro Abs: 7.1 10*3/uL (ref 1.7–7.7)
Neutrophils Relative %: 88 %
Platelets: 90 10*3/uL — ABNORMAL LOW (ref 150–400)
RBC: 4.02 MIL/uL — ABNORMAL LOW (ref 4.22–5.81)
RDW: 17.6 % — ABNORMAL HIGH (ref 11.5–15.5)
WBC: 8 10*3/uL (ref 4.0–10.5)
nRBC: 0 % (ref 0.0–0.2)

## 2020-04-06 LAB — PROCALCITONIN: Procalcitonin: 0.23 ng/mL

## 2020-04-06 LAB — TROPONIN I (HIGH SENSITIVITY)
Troponin I (High Sensitivity): 78 ng/L — ABNORMAL HIGH (ref <18)
Troponin I (High Sensitivity): 133 ng/L (ref <18)

## 2020-04-06 LAB — BLOOD GAS, ARTERIAL
Acid-Base Excess: 5.3 mmol/L — ABNORMAL HIGH (ref 0.0–2.0)
Bicarbonate: 29.9 mmol/L — ABNORMAL HIGH (ref 20.0–28.0)
FIO2: 50
MECHVT: 500 mL
Mechanical Rate: 20
O2 Saturation: 98.7 %
PEEP: 5 cmH2O
Patient temperature: 37
pCO2 arterial: 43 mmHg (ref 32.0–48.0)
pH, Arterial: 7.45 (ref 7.350–7.450)
pO2, Arterial: 117 mmHg — ABNORMAL HIGH (ref 83.0–108.0)

## 2020-04-06 LAB — PROTIME-INR
INR: 1.6 — ABNORMAL HIGH (ref 0.8–1.2)
Prothrombin Time: 18.2 seconds — ABNORMAL HIGH (ref 11.4–15.2)

## 2020-04-06 LAB — HEPARIN LEVEL (UNFRACTIONATED): Heparin Unfractionated: 0.17 IU/mL — ABNORMAL LOW (ref 0.30–0.70)

## 2020-04-06 LAB — LACTIC ACID, PLASMA
Lactic Acid, Venous: 3.5 mmol/L (ref 0.5–1.9)
Lactic Acid, Venous: 1.4 mmol/L (ref 0.5–1.9)

## 2020-04-06 LAB — RESP PANEL BY RT-PCR (FLU A&B, COVID) ARPGX2
Influenza A by PCR: NEGATIVE
Influenza B by PCR: NEGATIVE
SARS Coronavirus 2 by RT PCR: POSITIVE — AB

## 2020-04-06 LAB — BRAIN NATRIURETIC PEPTIDE: B Natriuretic Peptide: 191.9 pg/mL — ABNORMAL HIGH (ref 0.0–100.0)

## 2020-04-06 LAB — GLUCOSE, CAPILLARY: Glucose-Capillary: 175 mg/dL — ABNORMAL HIGH (ref 70–99)

## 2020-04-06 LAB — APTT: aPTT: 34 seconds (ref 24–36)

## 2020-04-06 MED ORDER — VANCOMYCIN HCL 10 G IV SOLR
2500.0000 mg | Freq: Once | INTRAVENOUS | Status: DC
  Filled 2020-04-06: qty 2500

## 2020-04-06 MED ORDER — MIDAZOLAM 50MG/50ML (1MG/ML) PREMIX INFUSION: 0.5000 mg/h | INTRAVENOUS | Status: DC

## 2020-04-06 MED ORDER — FENTANYL 2500MCG IN NS 250ML (10MCG/ML) PREMIX INFUSION
0.0000 ug/h | INTRAVENOUS | Status: DC
  Administered 2020-04-06: 25 ug/h via INTRAVENOUS
  Administered 2020-04-07: 175 ug/h via INTRAVENOUS
  Administered 2020-04-07: 150 ug/h via INTRAVENOUS
  Administered 2020-04-08 – 2020-04-09 (×2): 100 ug/h via INTRAVENOUS
  Administered 2020-04-10: 150 ug/h via INTRAVENOUS
  Administered 2020-04-11: 175 ug/h via INTRAVENOUS
  Filled 2020-04-06 (×6): qty 250

## 2020-04-06 MED ORDER — THIAMINE HCL 100 MG/ML IJ SOLN
500.0000 mg | Freq: Every day | INTRAVENOUS | Status: AC
  Administered 2020-04-06 – 2020-04-10 (×5): 500 mg via INTRAVENOUS
  Filled 2020-04-06 (×5): qty 5

## 2020-04-06 MED ORDER — PIPERACILLIN-TAZOBACTAM 3.375 G IVPB 30 MIN
3.3750 g | Freq: Once | INTRAVENOUS | Status: AC
  Administered 2020-04-06: 3.375 g via INTRAVENOUS
  Filled 2020-04-06: qty 50

## 2020-04-06 MED ORDER — VECURONIUM BROMIDE 10 MG IV SOLR
INTRAVENOUS | Status: AC
  Filled 2020-04-06: qty 10

## 2020-04-06 MED ORDER — HEPARIN (PORCINE) 25000 UT/250ML-% IV SOLN
2000.0000 [IU]/h | INTRAVENOUS | Status: DC
  Administered 2020-04-06: 1600 [IU]/h via INTRAVENOUS
  Filled 2020-04-06 (×3): qty 250

## 2020-04-06 MED ORDER — VECURONIUM BROMIDE 10 MG IV SOLR
10.0000 mg | INTRAVENOUS | Status: DC | PRN
  Administered 2020-04-06: 10 mg via INTRAVENOUS

## 2020-04-06 MED ORDER — IOHEXOL 350 MG/ML SOLN
100.0000 mL | Freq: Once | INTRAVENOUS | Status: AC | PRN
  Administered 2020-04-06: 100 mL via INTRAVENOUS

## 2020-04-06 MED ORDER — FUROSEMIDE 10 MG/ML IJ SOLN
40.0000 mg | Freq: Once | INTRAMUSCULAR | Status: AC
  Administered 2020-04-06: 40 mg via INTRAVENOUS
  Filled 2020-04-06 (×2): qty 4

## 2020-04-06 MED ORDER — DILTIAZEM HCL-DEXTROSE 125-5 MG/125ML-% IV SOLN (PREMIX)
5.0000 mg/h | INTRAVENOUS | Status: DC
  Administered 2020-04-06: 5 mg/h via INTRAVENOUS
  Filled 2020-04-06: qty 125

## 2020-04-06 MED ORDER — HEPARIN BOLUS VIA INFUSION
6000.0000 [IU] | Freq: Once | INTRAVENOUS | Status: AC
  Administered 2020-04-06: 6000 [IU] via INTRAVENOUS
  Filled 2020-04-06: qty 6000

## 2020-04-06 MED ORDER — PROPOFOL 1000 MG/100ML IV EMUL
INTRAVENOUS | Status: AC
  Administered 2020-04-06: 15 ug/kg/min via INTRAVENOUS
  Filled 2020-04-06: qty 100

## 2020-04-06 MED ORDER — DEXMEDETOMIDINE HCL IN NACL 400 MCG/100ML IV SOLN
0.0000 ug/kg/h | INTRAVENOUS | Status: AC
  Administered 2020-04-06: 0.4 ug/kg/h via INTRAVENOUS
  Administered 2020-04-08 – 2020-04-09 (×5): 0.6 ug/kg/h via INTRAVENOUS
  Administered 2020-04-09: 0.5 ug/kg/h via INTRAVENOUS
  Filled 2020-04-06 (×8): qty 100

## 2020-04-06 MED ORDER — MIDAZOLAM HCL 2 MG/2ML IJ SOLN
2.0000 mg | INTRAMUSCULAR | Status: DC | PRN
  Administered 2020-04-06 – 2020-04-11 (×4): 2 mg via INTRAVENOUS
  Filled 2020-04-06 (×7): qty 2

## 2020-04-06 MED ORDER — DILTIAZEM LOAD VIA INFUSION
10.0000 mg | Freq: Once | INTRAVENOUS | Status: AC
  Administered 2020-04-06: 10 mg via INTRAVENOUS
  Filled 2020-04-06: qty 10

## 2020-04-06 MED ORDER — ENOXAPARIN SODIUM 40 MG/0.4ML ~~LOC~~ SOLN: 40.0000 mg | SUBCUTANEOUS | Status: DC

## 2020-04-06 MED ORDER — VANCOMYCIN HCL 2000 MG/400ML IV SOLN
2000.0000 mg | Freq: Once | INTRAVENOUS | Status: AC
  Administered 2020-04-06: 2000 mg via INTRAVENOUS
  Filled 2020-04-06: qty 400

## 2020-04-06 MED ORDER — ASPIRIN 300 MG RE SUPP
300.0000 mg | Freq: Once | RECTAL | Status: DC
  Filled 2020-04-06: qty 1

## 2020-04-06 MED ORDER — METHYLPREDNISOLONE SODIUM SUCC 125 MG IJ SOLR
125.0000 mg | Freq: Once | INTRAMUSCULAR | Status: AC
  Administered 2020-04-06: 125 mg via INTRAVENOUS
  Filled 2020-04-06 (×2): qty 2

## 2020-04-06 MED ORDER — FAMOTIDINE IN NACL 20-0.9 MG/50ML-% IV SOLN
20.0000 mg | Freq: Two times a day (BID) | INTRAVENOUS | Status: DC
  Administered 2020-04-06 – 2020-04-14 (×16): 20 mg via INTRAVENOUS
  Filled 2020-04-06 (×17): qty 50

## 2020-04-06 MED ORDER — IPRATROPIUM-ALBUTEROL 0.5-2.5 (3) MG/3ML IN SOLN
9.0000 mL | Freq: Once | RESPIRATORY_TRACT | Status: AC
  Administered 2020-04-06: 9 mL via RESPIRATORY_TRACT
  Filled 2020-04-06: qty 9

## 2020-04-06 MED ORDER — FENTANYL 2500MCG IN NS 250ML (10MCG/ML) PREMIX INFUSION
INTRAVENOUS | Status: AC
  Filled 2020-04-06: qty 250

## 2020-04-06 MED ORDER — SODIUM CHLORIDE 0.9 % IV SOLN
100.0000 mg | Freq: Every day | INTRAVENOUS | Status: AC
  Administered 2020-04-07 – 2020-04-10 (×4): 100 mg via INTRAVENOUS
  Filled 2020-04-06: qty 20
  Filled 2020-04-06 (×4): qty 100

## 2020-04-06 MED ORDER — LACTATED RINGERS IV BOLUS: 500.0000 mL | Freq: Once | INTRAVENOUS | Status: DC

## 2020-04-06 MED ORDER — POLYETHYLENE GLYCOL 3350 17 G PO PACK: 17.0000 g | PACK | Freq: Every day | ORAL | Status: DC | PRN

## 2020-04-06 MED ORDER — SODIUM CHLORIDE 0.9 % IV SOLN
200.0000 mg | Freq: Once | INTRAVENOUS | Status: AC
  Administered 2020-04-06: 200 mg via INTRAVENOUS
  Filled 2020-04-06 (×2): qty 40

## 2020-04-06 MED ORDER — MIDAZOLAM HCL 2 MG/2ML IJ SOLN
2.0000 mg | INTRAMUSCULAR | Status: AC | PRN
  Administered 2020-04-06 (×3): 2 mg via INTRAVENOUS
  Filled 2020-04-06 (×2): qty 2

## 2020-04-06 MED ORDER — DOCUSATE SODIUM 100 MG PO CAPS: 100.0000 mg | ORAL_CAPSULE | Freq: Two times a day (BID) | ORAL | Status: DC | PRN

## 2020-04-06 MED ORDER — PROPOFOL 1000 MG/100ML IV EMUL
5.0000 ug/kg/min | INTRAVENOUS | Status: DC
  Administered 2020-04-06: 60 ug/kg/min via INTRAVENOUS
  Administered 2020-04-06 (×3): 50 ug/kg/min via INTRAVENOUS
  Administered 2020-04-07: 20 ug/kg/min via INTRAVENOUS
  Administered 2020-04-07 (×2): 50 ug/kg/min via INTRAVENOUS
  Administered 2020-04-07: 40 ug/kg/min via INTRAVENOUS
  Administered 2020-04-07: 50 ug/kg/min via INTRAVENOUS
  Administered 2020-04-07: 20 ug/kg/min via INTRAVENOUS
  Administered 2020-04-07 (×2): 50 ug/kg/min via INTRAVENOUS
  Administered 2020-04-07 (×2): 40 ug/kg/min via INTRAVENOUS
  Administered 2020-04-08 (×2): 30 ug/kg/min via INTRAVENOUS
  Administered 2020-04-08: 20 ug/kg/min via INTRAVENOUS
  Administered 2020-04-08 (×2): 40 ug/kg/min via INTRAVENOUS
  Administered 2020-04-08 (×2): 15 ug/kg/min via INTRAVENOUS
  Administered 2020-04-08: 40 ug/kg/min via INTRAVENOUS
  Administered 2020-04-09: 20 ug/kg/min via INTRAVENOUS
  Administered 2020-04-09: 15 ug/kg/min via INTRAVENOUS
  Administered 2020-04-09: 30 ug/kg/min via INTRAVENOUS
  Administered 2020-04-09: 35 ug/kg/min via INTRAVENOUS
  Administered 2020-04-09: 15 ug/kg/min via INTRAVENOUS
  Administered 2020-04-09: 35 ug/kg/min via INTRAVENOUS
  Administered 2020-04-09 – 2020-04-10 (×8): 30 ug/kg/min via INTRAVENOUS
  Administered 2020-04-11: 20 ug/kg/min via INTRAVENOUS
  Administered 2020-04-11: 35 ug/kg/min via INTRAVENOUS
  Administered 2020-04-11: 30 ug/kg/min via INTRAVENOUS
  Administered 2020-04-11: 35 ug/kg/min via INTRAVENOUS
  Administered 2020-04-11: 25 ug/kg/min via INTRAVENOUS
  Administered 2020-04-11 (×2): 30 ug/kg/min via INTRAVENOUS
  Administered 2020-04-11: 35 ug/kg/min via INTRAVENOUS
  Administered 2020-04-12: 20 ug/kg/min via INTRAVENOUS
  Administered 2020-04-12: 15 ug/kg/min via INTRAVENOUS
  Filled 2020-04-06: qty 100
  Filled 2020-04-06: qty 200
  Filled 2020-04-06: qty 100
  Filled 2020-04-06: qty 200
  Filled 2020-04-06 (×4): qty 100
  Filled 2020-04-06: qty 200
  Filled 2020-04-06 (×11): qty 100
  Filled 2020-04-06: qty 200
  Filled 2020-04-06 (×8): qty 100
  Filled 2020-04-06: qty 200
  Filled 2020-04-06 (×4): qty 100
  Filled 2020-04-06: qty 200
  Filled 2020-04-06 (×6): qty 100

## 2020-04-06 MED ORDER — FENTANYL CITRATE (PF) 100 MCG/2ML IJ SOLN
100.0000 ug | INTRAMUSCULAR | Status: DC | PRN
  Administered 2020-04-09 – 2020-04-10 (×2): 100 ug via INTRAVENOUS

## 2020-04-06 MED ORDER — DOCUSATE SODIUM 50 MG/5ML PO LIQD
100.0000 mg | Freq: Two times a day (BID) | ORAL | Status: DC
  Administered 2020-04-06 – 2020-04-12 (×12): 100 mg
  Filled 2020-04-06 (×14): qty 10

## 2020-04-06 MED ORDER — FENTANYL CITRATE (PF) 100 MCG/2ML IJ SOLN: 100.0000 ug | INTRAMUSCULAR | Status: DC | PRN

## 2020-04-06 NOTE — ED Notes (Signed)
This RN, Shepherd NT, John NT, Amy RN, and Shawn RN at bedside to assist in turning and cleaning pt at this time, due to pt arriving covered in feces.   During pt assistance, pt started to have increased respiratory distress. Pt BiPAP mask removed and NRB placed at this time. Pt adjusted in bed to aid in breathing at this time.   MD Katrinka Blazing called to bedside and respiratory called at this time.

## 2020-04-06 NOTE — ED Notes (Signed)
1 epi given 

## 2020-04-06 NOTE — ED Notes (Signed)
Pulse check at this time. Pulses felt at this time.

## 2020-04-06 NOTE — ED Notes (Signed)
ETT advanced to 26 @ lip by RT at this time per verbal order from MD Katrinka Blazing

## 2020-04-06 NOTE — Procedures (Signed)
Central Venous Catheter Insertion Procedure Note  Orell Hurtado  [id number]  09/15/1960  Date:04/06/20  Time:11:29 PM   Provider Performing:Gwendelyn Lanting L Rust-Chester   Procedure: Insertion of Non-tunneled Central Venous Catheter(36556) with US guidance (12751)   Indication(s) Medication administration and Difficult access  Consent Unable to obtain consent due to emergent nature of procedure. Attempted to reach sister, Tacy Learn, who is emergency contact. No answer and no voicemail available.  Anesthesia Topical only with 1% lidocaine   Timeout Verified patient identification, verified procedure, site/side was marked, verified correct patient position, special equipment/implants available, medications/allergies/relevant history reviewed, required imaging and test results available.  Sterile Technique Maximal sterile technique including full sterile barrier drape, hand hygiene, sterile gown, sterile gloves, mask, hair covering, sterile ultrasound probe cover (if used).  Procedure Description Area of catheter insertion was cleaned with chlorhexidine and draped in sterile fashion.  With real-time ultrasound guidance a central venous catheter was placed into the right internal jugular vein. Nonpulsatile blood flow and easy flushing noted in all ports.  The catheter was sutured in place and sterile dressing applied.  Complications/Tolerance None; patient tolerated the procedure well. Chest X-ray is ordered to verify placement for internal jugular or subclavian cannulation.   Chest x-ray is not ordered for femoral cannulation.  EBL Minimal  Specimen(s) None   Betsey Holiday, AGACNP-BC Acute Care Nurse Practitioner [facility]   [phone] / [phone] Please see Amion for pager details.

## 2020-04-06 NOTE — ED Notes (Signed)
Attempting to place another PIV at this time in order to run ordered antibiotics and continuous gtt at the same time.

## 2020-04-06 NOTE — ED Notes (Signed)
40 of etomidate given

## 2020-04-06 NOTE — Progress Notes (Addendum)
PHARMACY -  BRIEF ANTIBIOTIC NOTE   Pharmacy has received consult(s) for vancomycin from an ED provider.  The patient's profile has been reviewed for ht/wt/allergies/indication/available labs.    One time order(s) placed for vancomycin 2000mg   Further antibiotics/pharmacy consults should be ordered by admitting physician if indicated.                       , PharmD Pharmacy Resident  04/06/2020 12:16 PM

## 2020-04-06 NOTE — ED Notes (Signed)
Pulse check at this time. No pulses felt, CPR continued

## 2020-04-06 NOTE — ED Notes (Addendum)
Pt in CT at this time. Pt given 4 mg versed per MD Katrinka Blazing verbal order.

## 2020-04-06 NOTE — ED Notes (Signed)
Jae Dire RN at bedside with ultrasound for PIV placement

## 2020-04-06 NOTE — ED Notes (Signed)
1 epi given at this time. ?

## 2020-04-06 NOTE — ED Notes (Signed)
No pulses felt, CPR started at this time.

## 2020-04-06 NOTE — ED Notes (Signed)
Messaged MD Jayme Cloud regarding pt sedation orders at this time

## 2020-04-06 NOTE — Consult Note (Signed)
Remdesivir - Pharmacy Brief Note   O:  ALT: 48 CXR: Pulmonary vascular congestion and probable interstitial edema SpO2: 97% on NRB   A/P:  Remdesivir 200 mg IVPB once followed by 100 mg IVPB daily x 4 days.   Raiford Noble, PharmD Pharmacy Resident  04/06/2020 2:06 PM

## 2020-04-06 NOTE — Sepsis Progress Note (Signed)
elink is monitoring this code sepsis 

## 2020-04-06 NOTE — ED Notes (Signed)
Intubation complete at this time. 7 1/2 ETT measuring 22 at the lip.   Color change noted and bilateral breath sounds heard by MD Roxan Hockey at this time

## 2020-04-06 NOTE — ED Notes (Signed)
100 rocc given at this time

## 2020-04-06 NOTE — ED Triage Notes (Signed)
Pt arrived via [facility] from home with c/o SOB.   Per EMS, pt has been SOB the last few days. Per EMS, pt was using personal CPAP machine upon arrival with no relief.   Per pt, pt wears 4L O2 chronically.   Pt has hx CHF, Afib, and past MI  Per EMS, pt was 100% NRB @ 15L, BP 134/72, RR 30s   Respiratory called at this time by Amy RN

## 2020-04-06 NOTE — Progress Notes (Signed)
Date and time results received: 04/06/20 1706   Test: Troponin Critical Value: 133  Name of Provider Notified: Jayme Cloud, MD  Orders Received? Or Actions Taken?:  No new orders received.   Primary RN Irving Burton notified.

## 2020-04-06 NOTE — Consult Note (Signed)
CODE SEPSIS - PHARMACY COMMUNICATION  **Broad Spectrum Antibiotics should be administered within 1 hour of Sepsis diagnosis**  Time Code Sepsis Called/Page Received: 1340  Antibiotics Ordered:  Zosyn 3.375 IV x1 Vancomycin 2g IV x1  Time of 1st antibiotic administration: 1416   Raiford Noble, PharmD Pharmacy Resident  04/06/2020 1:41 PM

## 2020-04-06 NOTE — ED Notes (Signed)
RT at bedside.

## 2020-04-06 NOTE — ED Provider Notes (Addendum)
[facility] Emergency Department Provider Note  ____________________________________________   Event Date/Time   First MD Initiated Contact with Patient 04/06/20 1119     (approximate)  I have reviewed the triage vital signs and the nursing notes.   HISTORY  Chief Complaint Shortness of Breath   HPI [name] [name] is a 60 y.o. 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 fib anticoagulated on Coumadin and rate controlled on metoprolol, OSA, GERD, asthma, and depression who presents EMS from home for assessment of worsening shortness of breath of the last 24 hours associated with cough, myalgias and some nonbloody nonbilious emesis.  Patient denies any focal chest pain, Donnell pain, back pain, acute focal extremity pain, urinary symptoms or diarrhea.  No clear alleviating or aggravating factors.  He states he has been on CPAP all day but this does not seem to help much.  Further history is limited secondary to patient's respiratory distress.         Past Medical History:  Diagnosis Date  . Allergy   . Anxiety   . Arthritis   . Asthma   . Brain damage   . Chronic pain of both knees 07/13/2018  . Clotting disorder (HCC)   . COPD (chronic obstructive pulmonary disease) (HCC)   . Depression   . GERD (gastroesophageal reflux disease)   . HFrEF (heart failure with reduced ejection fraction) (HCC)    a. 03/2018 Echo: EF 25-30%, diff HK. Mod LAE.  [name] History of DVT (deep vein thrombosis)   . History of pulmonary embolism    a. Chronic coumadin.  [name] Hypertension   . MI (myocardial infarction) (HCC)   . Morbid obesity (HCC)   . Neck pain 07/13/2018  . NICM (nonischemic cardiomyopathy) (HCC)    a. s/p Cath x 3 - reportedly nl cors. Last cath 2019 in [place]; b. a. 03/2018 Echo: EF 25-30%, diff HK.  [name] Persistent atrial fibrillation (HCC)    a. 03/2018 s/p DCCV; b.  CHA2DS2VASc = 1-->Xarelto (later changed to warfarin); c. 05/2018 recurrent afib-->Amio initiated.  . Sleep apnea   . Sleep apnea     Patient Active Problem List   Diagnosis Date Noted  . Respiratory arrest (HCC) 04/06/2020  . Weakness of both lower extremities 03/26/2020  . CHF (congestive heart failure) (HCC) 01/27/2020  . Shortness of breath 12/22/2019  . Atrial fibrillation, chronic (HCC) 12/22/2019  . GAD (generalized anxiety disorder) 10/12/2019  . Second degree burn of left forearm 09/26/2019  . Alcohol withdrawal syndrome without complication (HCC)   . Lower extremity ulceration, unspecified laterality, with fat layer exposed (HCC) 08/09/2019  . Alcohol abuse   . Pressure injury of skin 06/26/2019  . Iron deficiency anemia 06/22/2019  . Depression 06/22/2019  . Elevated troponin 06/22/2019  . Left-sided Bell's palsy 05/08/2019  . Chronic intractable headache 05/03/2019  . Noncompliance by refusing service 05/03/2019  . Facial droop 05/02/2019  . Pharmacologic therapy 04/11/2019  . Disorder of skeletal system 04/11/2019  . Problems influencing health status 04/11/2019  . Chronic anticoagulation (warfarin  COUMADIN) 04/11/2019  . Elevated sed rate 04/11/2019  . Elevated C-reactive protein (CRP) 04/11/2019  . Elevated hemoglobin A1c 04/11/2019  . Hypoalbuminemia 04/11/2019  . Edema due to hypoalbuminemia 04/11/2019  . Elevated brain natriuretic peptide (BNP) level 04/11/2019  . Chronic hip pain (Right) 04/11/2019  . Osteoarthritis of hip (Right) 04/11/2019  . Chronic  atrial fibrillation (HCC) 03/08/2019  . Atrial fibrillation with RVR (HCC) 03/08/2019  . Degenerative joint disease of right hip 03/05/2019  . Hypothyroidism 03/04/2019  . Chronic venous stasis dermatitis of both lower extremities 03/04/2019  . Long term (current) use of anticoagulants 02/23/2019  . PE (pulmonary thromboembolism) (HCC) 01/21/2019  . HLD (hyperlipidemia) 01/21/2019  . CAD (coronary artery  disease) 01/21/2019  . Noncompliance 01/09/2019  . Acquired thrombophilia (HCC) 11/28/2018  . Major depressive disorder, recurrent episode, in partial remission (HCC) 09/17/2018  . Chest pain 08/06/2018  . Personal history of DVT (deep vein thrombosis) 07/13/2018  . History of pulmonary embolism (on Coumadin) 07/13/2018  . Neck pain 07/13/2018  . Chronic low back pain (Bilateral)  w/ sciatica (Bilateral) 07/13/2018  . TBI (traumatic brain injury) (HCC) 07/04/2018  . Abnormal thyroid blood test 06/27/2018  . Type 2 diabetes mellitus with other specified complication (HCC) 06/06/2018  . HTN (hypertension) 06/06/2018  . Chronic gout involving toe, unspecified cause, unspecified laterality 06/06/2018  . Morbid obesity with BMI of 60.0-69.9, adult (HCC) 06/06/2018  . Chronic pain syndrome 06/06/2018  . Ulcers of both lower extremities, limited to breakdown of skin (HCC) 06/06/2018  . Gout 06/06/2018  . Diet-controlled diabetes mellitus (HCC) 06/06/2018  . Acute on chronic diastolic CHF (congestive heart failure) (HCC) 02/02/2018  . Centrilobular emphysema (HCC) 02/02/2018  . Obstructive sleep apnea 02/02/2018  . Lymphedema 02/02/2018  . COPD (chronic obstructive pulmonary disease) (HCC) 02/02/2018  . Chronic diastolic CHF (congestive heart failure) (HCC) 02/02/2018    Past Surgical History:  Procedure Laterality Date  . CARDIOVERSION N/A 03/24/2018   Procedure: CARDIOVERSION;  Surgeon: Antonieta Iba, MD;  Location: [facility] ORS;  Service: Cardiovascular;  Laterality: N/A;  . CARDIOVERSION N/A 08/08/2018   Procedure: CARDIOVERSION;  Surgeon: [name], MD;  Location: [facility] ORS;  Service: Cardiovascular;  Laterality: N/A;  . hearnia repair     X 3- total of two surgeries  . HERNIA REPAIR    . LEG SURGERY      Prior to Admission medications   Medication Sig Start Date End Date Taking? Authorizing Provider  DULoxetine (CYMBALTA) 20 MG capsule Take 1 capsule (20 mg total) by mouth  daily. 10/25/19  Yes Karamalegos, Netta Neat, DO  levothyroxine (SYNTHROID) 75 MCG tablet Take 1 tablet (75 mcg total) by mouth daily before breakfast. 01/13/20  Yes Enedina Finner, MD  meclizine (ANTIVERT) 25 MG tablet Take 0.5-1 tablets (12.5-25 mg total) by mouth 3 (three) times daily as needed for dizziness. 02/26/20  Yes Karamalegos, Netta Neat, DO  torsemide (DEMADEX) 20 MG tablet Take 4 tablets (80 mg total) by mouth 2 (two) times daily. 02/02/20  Yes Esaw Grandchild A, DO  warfarin (COUMADIN) 5 MG tablet Take 5 mg by mouth daily.  09/20/19  Yes [provider]  albuterol (VENTOLIN HFA) 108 (90 Base) MCG/ACT inhaler Inhale 2 puffs into the lungs every 4 (four) hours as needed for wheezing or shortness of breath. Patient not taking: No sig reported 10/25/19   [name], DO  amiodarone (PACERONE) 200 MG tablet Take 1 tablet (200 mg total) by mouth 2 (two) times daily. Patient not taking: Reported on 04/06/2020 01/12/20   Enedina Finner, MD  aspirin 81 MG chewable tablet Chew 1 tablet (81 mg total) by mouth daily. 02/03/20   Esaw Grandchild A, DO  atorvastatin (LIPITOR) 40 MG tablet TAKE 1 TABLET BY MOUTH ONCE DAILY Patient taking differently: Take 40 mg by mouth daily. 08/15/19   Flinchum,  [name], FNP  carisoprodol (SOMA) 350 MG tablet Take 1 tablet (350 mg total) by mouth 3 (three) times daily as needed for muscle spasms. Patient not taking: No sig reported 02/02/20   Esaw Grandchild A, DO  gabapentin (NEURONTIN) 300 MG capsule Take 300 mg by mouth 3 (three) times daily. 02/15/20   [provider]  lisinopril (ZESTRIL) 2.5 MG tablet Take by mouth. 03/06/19   [provider]  loperamide (IMODIUM) 2 MG capsule Take 1 capsule (2 mg total) by mouth as needed for diarrhea or loose stools. 06/26/19   Arnetha Courser, MD  magnesium oxide (MAG-OX) 400 (241.3 Mg) MG tablet Take 1 tablet (400 mg total) by mouth 2 (two) times daily. 01/12/20   Enedina Finner, MD  metoprolol  succinate (TOPROL-XL) 50 MG 24 hr tablet Take 2 tablets (100 mg total) by mouth daily. Take with or immediately following a meal. 01/12/20   Enedina Finner, MD  montelukast (SINGULAIR) 10 MG tablet Take 1 tablet (10 mg total) by mouth at bedtime. 10/12/19   Karamalegos, Netta Neat, DO  Multiple Vitamin (MULTIVITAMIN WITH MINERALS) TABS tablet Take 1 tablet by mouth daily.    [provider]  naphazoline-glycerin (CLEAR EYES REDNESS) 0.012-0.2 % SOLN Place 1-2 drops into both eyes 4 (four) times daily as needed for eye irritation. 08/14/19   Alford Highland, MD  nitroGLYCERIN (NITROSTAT) 0.4 MG SL tablet Place 1 tablet under tongue every 5 minutes as needed for chest pain. (No more than 3 doses within 15 minutes) 03/08/19   Flinchum, [name], FNP  potassium chloride (KLOR-CON) 20 MEQ packet Take 40 mEq by mouth daily. Patient not taking: Reported on 04/06/2020 01/12/20   Enedina Finner, MD  SUMAtriptan (IMITREX) 50 MG tablet Take 1 tablet (50 mg total) by mouth every 2 (two) hours as needed for migraine or headache. May repeat in 2 hours if headache persists or recurs. No more than 2 doses in a day 12/28/19   Lurene Shadow, MD  thiamine 100 MG tablet Take 1 tablet (100 mg total) by mouth daily. Patient not taking: Reported on 04/06/2020 08/14/19   Alford Highland, MD  traZODone (DESYREL) 150 MG tablet Take 1 tablet (150 mg total) by mouth at bedtime. 10/25/19   [name], DO    Allergies Patient has no known allergies.  Family History  Problem Relation Age of Onset  . Heart failure Mother   . Lung cancer Mother   . Lung cancer Father   . Heart attack Maternal Grandmother   . Heart attack Maternal Grandfather     Social History Social History   Tobacco Use  . Smoking status: Never Smoker  . Smokeless tobacco: Never Used  Vaping Use  . Vaping Use: Never used  Substance Use Topics  . Alcohol use: Yes  . Drug use: Never    Review of Systems  Review of Systems   Constitutional: Positive for malaise/fatigue. Negative for chills and fever.  HENT: Negative for sore throat.   Eyes: Negative for pain.  Respiratory: Positive for cough and shortness of breath. Negative for stridor.   Cardiovascular: Negative for chest pain.  Gastrointestinal: Positive for nausea and vomiting.  Genitourinary: Negative for dysuria.  Musculoskeletal: Positive for myalgias.  Skin: Negative for rash.  Neurological: Negative for seizures, loss of consciousness and headaches.  Psychiatric/Behavioral: Negative for suicidal ideas.  All other systems reviewed and are negative.     ____________________________________________   PHYSICAL EXAM:  VITAL SIGNS: ED Triage Vitals  Enc Vitals Group     BP --      Pulse --      Resp --      Temp --      Temp src --      SpO2 04/06/20 1113 100 %     Weight 04/06/20 1114 (!) 410 lb 0.9 oz (186 kg)     Height 04/06/20 1114 5\' 9"  (1.753 m)     Head Circumference --      Peak Flow --      Pain Score --      Pain Loc --      Pain Edu? --      Excl. in GC? --    Vitals:   04/06/20 1603 04/06/20 1608  BP:  107/66  Pulse:  (!) 116  Resp:  20  Temp:    SpO2: 94% 96%   Physical Exam Vitals and nursing note reviewed.  Constitutional:      General: He is in acute distress.     Appearance: He is well-developed and well-nourished. He is obese. He is ill-appearing.  HENT:     Head: Normocephalic and atraumatic.     Right Ear: External ear normal.     Left Ear: External ear normal.     Nose: Nose normal.  Eyes:     Conjunctiva/sclera: Conjunctivae normal.  Cardiovascular:     Rate and Rhythm: Tachycardia present. Rhythm irregular.     Heart sounds: No murmur heard.   Pulmonary:     Effort: Tachypnea, accessory muscle usage and respiratory distress present.     Breath sounds: Decreased air movement present. Wheezing and rhonchi present.  Abdominal:     Palpations: Abdomen is soft.     Tenderness: There is no  abdominal tenderness.  Musculoskeletal:     Cervical back: Neck supple.     Right lower leg: Edema present.     Left lower leg: Edema present.  Skin:    General: Skin is warm and dry.     Capillary Refill: Capillary refill takes less than 2 seconds.  Neurological:     Mental Status: He is alert and oriented to person, place, and time.  Psychiatric:        Mood and Affect: Mood and affect and mood normal.      ____________________________________________   LABS (all labs ordered are listed, but only abnormal results are displayed)  Labs Reviewed  RESP PANEL BY RT-PCR (FLU A&B, COVID) ARPGX2 - Abnormal; Notable for the following components:      Result Value   SARS Coronavirus 2 by RT PCR POSITIVE (*)    All other components within normal limits  BLOOD GAS, VENOUS - Abnormal; Notable for the following components:   pH, Ven 7.23 (*)    pO2, Ven 70.0 (*)    Acid-base deficit 5.4 (*)    All other components within normal limits  CBC WITH DIFFERENTIAL/PLATELET - Abnormal; Notable for the following components:   RBC 4.02 (*)    Hemoglobin 12.4 (*)    HCT 38.9 (*)    RDW 17.6 (*)    Platelets 90 (*)    Lymphs Abs 0.3 (*)    All other components within normal limits  COMPREHENSIVE METABOLIC PANEL - Abnormal; Notable for the following components:   Sodium 129 (*)    Chloride 88 (*)    Glucose, Bld 170 (*)    BUN 27 (*)    Calcium 7.5 (*)  AST 144 (*)    ALT 48 (*)    Total Bilirubin 1.4 (*)    Anion gap 17 (*)    All other components within normal limits  BRAIN NATRIURETIC PEPTIDE - Abnormal; Notable for the following components:   B Natriuretic Peptide 191.9 (*)    All other components within normal limits  LACTIC ACID, PLASMA - Abnormal; Notable for the following components:   Lactic Acid, Venous 3.5 (*)    All other components within normal limits  PROTIME-INR - Abnormal; Notable for the following components:   Prothrombin Time 18.2 (*)    INR 1.6 (*)    All other  components within normal limits  URINALYSIS, COMPLETE (UACMP) WITH MICROSCOPIC - Abnormal; Notable for the following components:   Color, Urine AMBER (*)    APPearance CLOUDY (*)    Glucose, UA 50 (*)    Hgb urine dipstick LARGE (*)    Ketones, ur 5 (*)    Protein, ur >=300 (*)    Leukocytes,Ua TRACE (*)    RBC / HPF >50 (*)    Bacteria, UA RARE (*)    All other components within normal limits  GLUCOSE, CAPILLARY - Abnormal; Notable for the following components:   Glucose-Capillary 175 (*)    All other components within normal limits  TROPONIN I (HIGH SENSITIVITY) - Abnormal; Notable for the following components:   Troponin I (High Sensitivity) 78 (*)    All other components within normal limits  TROPONIN I (HIGH SENSITIVITY) - Abnormal; Notable for the following components:   Troponin I (High Sensitivity) 133 (*)    All other components within normal limits  CULTURE, BLOOD (ROUTINE X 2)  CULTURE, BLOOD (ROUTINE X 2)  URINE CULTURE  CULTURE, RESPIRATORY W GRAM STAIN  PROCALCITONIN  APTT  LACTIC ACID, PLASMA  HEPARIN LEVEL (UNFRACTIONATED)  TRIGLYCERIDES   ____________________________________________  EKG  A. fib with a rapid ventricular rate with a ventricular rate of 135, PVC, QRS at 131 and nonspecific ST changes throughout. ____________________________________________  RADIOLOGY  ED MD interpretation: Endotracheal tube appears at the very top of the trachea.  There is some pulmonary vascular congestion and pulmonary edema.  No clear focal consolidation, effusion or pneumothorax.   Official radiology report(s): DG Chest 1 View  Result Date: 04/06/2020 CLINICAL DATA:  Status post advancement of endotracheal tube. EXAM: CHEST  1 VIEW COMPARISON:  Single-view of the chest earlier today. FINDINGS: Endotracheal tube tip is in good position at the level the clavicular heads. OG tube side port is in the distal esophagus. Recommend advancement of the tube by approximately 11  cm. There is cardiomegaly and vascular congestion. Mild left basilar atelectasis. IMPRESSION: ETT in good position. OG tube side port is in the esophagus. Recommend advancement of approximately 11 cm. Cardiomegaly and vascular congestion. Electronically Signed   By: Drusilla Kanner M.D.   On: 04/06/2020 14:55   DG Chest 1 View  Result Date: 04/06/2020 CLINICAL DATA:  Status post intubation. EXAM: CHEST  1 VIEW COMPARISON:  01/27/2020 FINDINGS: 1232 hours. Low volume film. Endotracheal tube not definitely visualized although the tip may project over the T1 vertebral body. The cardio pericardial silhouette is enlarged. There is pulmonary vascular congestion probable interstitial edema no substantial pleural effusion. Telemetry leads overlie the chest. IMPRESSION: 1. Endotracheal tube not definitely visualized although the tip may project over the T1 vertebral body. 2. Pulmonary vascular congestion and probable interstitial edema. Electronically Signed   By: Jamison Oka.D.  On: 04/06/2020 13:23   CT Angio Chest PE W and/or Wo Contrast  Result Date: 04/06/2020 CLINICAL DATA:  60 year old male with positive COVID-19. Concern for pulmonary embolism. EXAM: CT ANGIOGRAPHY CHEST WITH CONTRAST TECHNIQUE: Multidetector CT imaging of the chest was performed using the standard protocol during bolus administration of intravenous contrast. Multiplanar CT image reconstructions and MIPs were obtained to evaluate the vascular anatomy. CONTRAST:  OMNIPAQUE IOHEXOL 350 MG/ML SOLN COMPARISON:  Chest CT dated 01/27/2020 FINDINGS: Evaluation is very limited due to body habitus and streak artifact caused by patient's arms. Cardiovascular: There is mild cardiomegaly. Retrograde flow of contrast from the right atrium into the IVC suggestive of a degree of right heart dysfunction. Mild atherosclerotic calcification of the thoracic aorta. Evaluation of the pulmonary arteries is very limited due to respiratory motion  artifact and streak artifact caused by patient's arms. No obvious large central pulmonary artery embolus identified. Mediastinum/Nodes: No definite hilar or mediastinal adenopathy. An enteric tube within the esophagus extending into the body of the stomach. No mediastinal fluid collection. Lungs/Pleura: Probable trace bilateral pleural effusions. Bilateral lower lobe subsegmental and streaky densities may represent atelectasis or scarring. Infiltrate is not excluded. Linear left upper lobe probable atelectasis or scarring. No pneumothorax. The central airways remain patent. Upper Abdomen: Fatty liver. Musculoskeletal: Degenerative changes of the spine. No acute osseous pathology. Review of the MIP images confirms the above findings. IMPRESSION: 1. No obvious large central pulmonary artery embolus identified. 2. Probable trace bilateral pleural effusions. Bilateral lower lobe subsegmental and streaky densities may represent atelectasis or scarring. Infiltrate is not excluded. 3. Aortic Atherosclerosis (ICD10-I70.0). Electronically Signed   By: Elgie Collard M.D.   On: 04/06/2020 15:58    ____________________________________________   PROCEDURES  Procedure(s) performed (including Critical Care):  Procedure Name: Intubation Date/Time: 04/06/2020 1:25 PM Performed by: Gilles Chiquito, MD Pre-anesthesia Checklist: Patient identified, Patient being monitored, Emergency Drugs available, Timeout performed and Suction available Oxygen Delivery Method: Ambu bag Induction Type: Rapid sequence Laryngoscope Size: Glidescope Grade View: Grade IV Tube size: 7.0 mm Number of attempts: 1 Airway Equipment and Method: Video-laryngoscopy Future Recommendations: Recommend- induction with short-acting agent, and alternative techniques readily available     .Critical Care Performed by: Gilles Chiquito, MD Authorized by: Gilles Chiquito, MD   Critical care provider statement:    Critical care time  (minutes):  45   Critical care time was exclusive of:  Separately billable procedures and treating other patients   Critical care was necessary to treat or prevent imminent or life-threatening deterioration of the following conditions:  Respiratory failure and cardiac failure   Critical care was time spent personally by me on the following activities:  Discussions with consultants, evaluation of patient's response to treatment, examination of patient, ordering and performing treatments and interventions, ordering and review of laboratory studies, ordering and review of radiographic studies, pulse oximetry, re-evaluation of patient's condition, obtaining history from patient or surrogate and review of old charts     ____________________________________________   INITIAL IMPRESSION / ASSESSMENT AND PLAN / ED COURSE      Patient presents with above to history exam for assessment worsening shortness of breath cough myalgias, nausea and vomiting.  He had been wearing his CPAP during the day although is typically on 4 L at baseline.  On arrival patient is tachycardic in the 120s, tachypneic in the mid 20s and on 15 L nonrebreather with his evidence of significant respiratory distress with increased accessory muscle usage.  He  has bilateral rhonchi.  Initial differential includes but is not limited to acute COPD exacerbation, acute heart failure exacerbation, ACS, PE, pneumonia, arrhythmia, symptomatic anemia, pneumothorax, symptomatic pleural effusion and other pathologies.  Patient immediately placed on BiPAP to assist with work of breathing after transfer to ED stretcher.  However shortly after being placed on BiPAP patient was noted to become unresponsive and pulseless.  BiPAP mask immediately removed and patient was bagged with BVM while ACLS protocols were followed for approximately 10 minutes until ROSC was obtained.  Patient was asystole throughout this resuscitation and did receive several  ampules of 1 mg of epinephrine.  He was intubated during as per procedure note above.  Following this a chest x-ray is obtained that showed ET tube in the trachea but in a very high position and was simply advanced 4 cm.  ECG obtained shows A. fib with RVR.  Given stable blood pressure patient was placed on diltiazem drip.  CBC today shows no leukocytosis and hemoglobin at baseline.  CMP shows sodium of 129, glucose of 170 and no other significant derangements aside from AST of 144.  BNP is only slightly elevated 191 which appears to be close to patient's baseline although he is obese and is unclear how accurate this is at this time.  Troponin is 78 which is suspect reflects significant demand ischemia although we will plan to trend this.  Patient given ASA.  INR subtherapeutic at 1.6 and given elevated troponin and subtherapeutic INR we will also start heparin while in the ED.  Procalcitonin is 0.23.  Lactic acid is 3.5.  Blood and urine cultures were obtained and patient was given broad-spectrum robotics.  He appears euvolemic at this time we will hold off on additional fluid resuscitation as he has some pulmonary edema on his chest x-ray.  In addition I am concerned the heart failure may have contributed to his respiratory arrest in the emergency room.  CTA ordered to assess for evidence of PE.  He will be admitted to medical ICU for further evaluation management.   ____________________________________________   FINAL CLINICAL IMPRESSION(S) / ED DIAGNOSES  Final diagnoses:  Acute on chronic respiratory failure with hypoxia and hypercapnia (HCC)  Cardiac arrest (HCC)  Troponin I above reference range  Atrial fibrillation with RVR (HCC)  Acute pulmonary edema (HCC)  Sepsis, due to unspecified organism, unspecified whether acute organ dysfunction present (HCC)  COVID-19    Medications  diltiazem (CARDIZEM) 1 mg/mL load via infusion 10 mg (10 mg Intravenous Bolus from Bag 04/06/20 1217)     And  diltiazem (CARDIZEM) 125 mg in dextrose 5% 125 mL (1 mg/mL) infusion (15 mg/hr Intravenous Rate/Dose Change 04/06/20 1316)  fentaNYL (SUBLIMAZE) injection 100 mcg (has no administration in time range)  fentaNYL (SUBLIMAZE) injection 100 mcg (has no administration in time range)  dexmedetomidine (PRECEDEX) 400 MCG/100ML (4 mcg/mL) infusion (0 mcg/kg/hr  186 kg Intravenous Stopped 04/06/20 1443)  midazolam (VERSED) injection 2 mg (has no administration in time range)  aspirin suppository 300 mg (0 mg Rectal Hold 04/06/20 1507)  docusate sodium (COLACE) capsule 100 mg (has no administration in time range)  polyethylene glycol (MIRALAX / GLYCOLAX) packet 17 g (has no administration in time range)  docusate (COLACE) 50 MG/5ML liquid 100 mg (0 mg Per Tube Hold 04/06/20 1341)  heparin ADULT infusion 100 units/mL (25000 units/247mL) (0 Units/hr Intravenous Stopped 04/06/20 1610)  methylPREDNISolone sodium succinate (SOLU-MEDROL) 125 mg/2 mL injection 125 mg (has no administration in time range)  furosemide (  LASIX) injection 40 mg (has no administration in time range)  remdesivir 200 mg in sodium chloride 0.9% 250 mL IVPB (has no administration in time range)    Followed by  remdesivir 100 mg in sodium chloride 0.9 % 100 mL IVPB (has no administration in time range)  propofol (DIPRIVAN) 1000 MG/100ML infusion (60 mcg/kg/min  186 kg Intravenous New Bag/Given 04/06/20 1657)  famotidine (PEPCID) IVPB 20 mg premix (has no administration in time range)  vecuronium (NORCURON) injection 10 mg (10 mg Intravenous Given 04/06/20 1611)  fentaNYL 2500mcg in NS 250mL (3710mcg/ml) infusion-PREMIX (0 mcg/hr Intravenous Stopped 04/06/20 1610)  midazolam (VERSED) 50 mg/50 mL (1 mg/mL) premix infusion (has no administration in time range)  ipratropium-albuterol (DUONEB) 0.5-2.5 (3) MG/3ML nebulizer solution 9 mL (9 mLs Nebulization Given 04/06/20 1129)  piperacillin-tazobactam (ZOSYN) IVPB 3.375 g (0 g Intravenous Stopped  04/06/20 1500)  midazolam (VERSED) injection 2 mg (2 mg Intravenous Given 04/06/20 1430)  vancomycin (VANCOREADY) IVPB 2000 mg/400 mL ( Intravenous Restarted 04/06/20 1627)  heparin bolus via infusion 6,000 Units (6,000 Units Intravenous Bolus from Bag 04/06/20 1334)  iohexol (OMNIPAQUE) 350 MG/ML injection 100 mL (100 mLs Intravenous Contrast Given 04/06/20 1549)  vecuronium (NORCURON) 10 MG injection (  Duplicate 04/06/20 1613)  fentaNYL 10 mcg/ml infusion (  Duplicate 04/06/20 1612)     ED Discharge Orders    None       Note:  This document was prepared using Dragon voice recognition software and may include unintentional dictation errors.   Gilles ChiquitoSmith, Zachary P, MD 04/06/20 1336    Gilles ChiquitoSmith, Zachary P, MD 04/06/20 1339    Gilles ChiquitoSmith, Zachary P, MD 04/06/20 [phone]

## 2020-04-06 NOTE — ED Notes (Signed)
1L NS bolus started at this time.
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
## 2020-04-06 NOTE — Progress Notes (Signed)
ANTICOAGULATION CONSULT NOTE  Pharmacy Consult for heparin Indication: atrial fibrillation  No Known Allergies  Patient Measurements: Height: 5\' 9"  (175.3 cm) Weight: (!) 186 kg (410 lb 0.9 oz) IBW/kg (Calculated) : 70.7 Heparin Dosing Weight: 117 kg  Vital Signs: Temp: 98.1 F (36.7 C) (02/20 1125) Temp Source: Oral (02/20 1125) BP: 131/65 (02/20 1245) Pulse Rate: 126 (02/20 1245)  Labs: Recent Labs    04/06/20 1111  HGB 12.4*  HCT 38.9*  PLT 90*  APTT 34  LABPROT 18.2*  INR 1.6*  CREATININE 1.07  TROPONINIHS 78*    Estimated Creatinine Clearance: 122.8 mL/min (by C-G formula based on SCr of 1.07 mg/dL).   Medical History: Past Medical History:  Diagnosis Date  . Allergy   . Anxiety   . Arthritis   . Asthma   . Brain damage   . Chronic pain of both knees 07/13/2018  . Clotting disorder (HCC)   . COPD (chronic obstructive pulmonary disease) (HCC)   . Depression   . GERD (gastroesophageal reflux disease)   . HFrEF (heart failure with reduced ejection fraction) (HCC)    a. 03/2018 Echo: EF 25-30%, diff HK. Mod LAE.  04/2018 History of DVT (deep vein thrombosis)   . History of pulmonary embolism    a. Chronic coumadin.  [name] Hypertension   . MI (myocardial infarction) (HCC)   . Morbid obesity (HCC)   . Neck pain 07/13/2018  . NICM (nonischemic cardiomyopathy) (HCC)    a. s/p Cath x 3 - reportedly nl cors. Last cath 2019 in [place]; b. a. 03/2018 Echo: EF 25-30%, diff HK.  04/2018 Persistent atrial fibrillation (HCC)    a. 03/2018 s/p DCCV; b. CHA2DS2VASc = 1-->Xarelto (later changed to warfarin); c. 05/2018 recurrent afib-->Amio initiated.  . Sleep apnea   . Sleep apnea     Assessment: 60 year old male presented with SOB. Patient with h/o atrial fibrillation on warfarin PTA. Pharmacy consult for heparin for afib. Platelets 90 on admission.  Goal of Therapy:  Heparin level 0.3-0.7 units/ml Monitor platelets by anticoagulation protocol: Yes   Plan:  Heparin 6000 unit bolus  followed by heparin drip at 1600 units/hr. Check HL at 2000. CBC daily while on heparin drip.  46, PharmD 04/06/2020,1:14 PM

## 2020-04-06 NOTE — ED Notes (Signed)
Report given over the phone to [facility] in ICU

## 2020-04-06 NOTE — ED Notes (Signed)
Intubation started by MD Katrinka Blazing at this time.

## 2020-04-07 ENCOUNTER — Inpatient Hospital Stay
Admit: 2020-04-07 | Discharge: 2020-04-07 | Disposition: A | Discharge status: Home / Self Care | Payer: Medicaid Other | Attending: Pulmonary Disease | Admitting: Pulmonary Disease

## 2020-04-07 LAB — CBC
HCT: 35.1 % — ABNORMAL LOW (ref 39.0–52.0)
HCT: 33.9 % — ABNORMAL LOW (ref 39.0–52.0)
Hemoglobin: 11.5 g/dL — ABNORMAL LOW (ref 13.0–17.0)
Hemoglobin: 10.9 g/dL — ABNORMAL LOW (ref 13.0–17.0)
MCH: 31.3 pg (ref 26.0–34.0)
MCH: 30.7 pg (ref 26.0–34.0)
MCHC: 32.8 g/dL (ref 30.0–36.0)
MCHC: 32.2 g/dL (ref 30.0–36.0)
MCV: 95.4 fL (ref 80.0–100.0)
MCV: 95.5 fL (ref 80.0–100.0)
Platelets: 72 10*3/uL — ABNORMAL LOW (ref 150–400)
Platelets: 77 10*3/uL — ABNORMAL LOW (ref 150–400)
RBC: 3.68 MIL/uL — ABNORMAL LOW (ref 4.22–5.81)
RBC: 3.55 MIL/uL — ABNORMAL LOW (ref 4.22–5.81)
RDW: 17.5 % — ABNORMAL HIGH (ref 11.5–15.5)
RDW: 17.5 % — ABNORMAL HIGH (ref 11.5–15.5)
WBC: 4.7 10*3/uL (ref 4.0–10.5)
WBC: 5.9 10*3/uL (ref 4.0–10.5)
nRBC: 0 % (ref 0.0–0.2)
nRBC: 0.5 % — ABNORMAL HIGH (ref 0.0–0.2)

## 2020-04-07 LAB — COMPREHENSIVE METABOLIC PANEL
ALT: 36 U/L (ref 0–44)
AST: 97 U/L — ABNORMAL HIGH (ref 15–41)
Albumin: 2.5 g/dL — ABNORMAL LOW (ref 3.5–5.0)
Alkaline Phosphatase: 65 U/L (ref 38–126)
Anion gap: 11 (ref 5–15)
BUN: 32 mg/dL — ABNORMAL HIGH (ref 6–20)
CO2: 28 mmol/L (ref 22–32)
Calcium: 6.9 mg/dL — ABNORMAL LOW (ref 8.9–10.3)
Chloride: 92 mmol/L — ABNORMAL LOW (ref 98–111)
Creatinine, Ser: 1.13 mg/dL (ref 0.61–1.24)
GFR, Estimated: >60 mL/min (ref >60)
Glucose, Bld: 135 mg/dL — ABNORMAL HIGH (ref 70–99)
Potassium: 3.3 mmol/L — ABNORMAL LOW (ref 3.5–5.1)
Sodium: 131 mmol/L — ABNORMAL LOW (ref 135–145)
Total Bilirubin: 1.6 mg/dL — ABNORMAL HIGH (ref 0.3–1.2)
Total Protein: 6.2 g/dL — ABNORMAL LOW (ref 6.5–8.1)

## 2020-04-07 LAB — BASIC METABOLIC PANEL
Anion gap: 11 (ref 5–15)
BUN: 30 mg/dL — ABNORMAL HIGH (ref 6–20)
CO2: 29 mmol/L (ref 22–32)
Calcium: 7.3 mg/dL — ABNORMAL LOW (ref 8.9–10.3)
Chloride: 90 mmol/L — ABNORMAL LOW (ref 98–111)
Creatinine, Ser: 1.12 mg/dL (ref 0.61–1.24)
GFR, Estimated: >60 mL/min (ref >60)
Glucose, Bld: 168 mg/dL — ABNORMAL HIGH (ref 70–99)
Potassium: 4.1 mmol/L (ref 3.5–5.1)
Sodium: 130 mmol/L — ABNORMAL LOW (ref 135–145)

## 2020-04-07 LAB — PHOSPHORUS
Phosphorus: <1 mg/dL — CL (ref 2.5–4.6)
Phosphorus: 2.2 mg/dL — ABNORMAL LOW (ref 2.5–4.6)

## 2020-04-07 LAB — TRIGLYCERIDES: Triglycerides: 113 mg/dL (ref <150)

## 2020-04-07 LAB — GLUCOSE, CAPILLARY
Glucose-Capillary: 153 mg/dL — ABNORMAL HIGH (ref 70–99)
Glucose-Capillary: 121 mg/dL — ABNORMAL HIGH (ref 70–99)
Glucose-Capillary: 136 mg/dL — ABNORMAL HIGH (ref 70–99)
Glucose-Capillary: 142 mg/dL — ABNORMAL HIGH (ref 70–99)
Glucose-Capillary: 141 mg/dL — ABNORMAL HIGH (ref 70–99)

## 2020-04-07 LAB — TROPONIN I (HIGH SENSITIVITY): Troponin I (High Sensitivity): 87 ng/L — ABNORMAL HIGH (ref <18)

## 2020-04-07 LAB — ECHOCARDIOGRAM COMPLETE
AR max vel: 2.8 cm2
AV Area VTI: 2.41 cm2
AV Area mean vel: 2.63 cm2
AV Mean grad: 6 mmHg
AV Peak grad: 11.7 mmHg
Ao pk vel: 1.71 m/s
Area-P 1/2: 3.13 cm2
Height: 69 in
MV VTI: 2.85 cm2
S' Lateral: 5.4 cm
Weight: 6895.99 oz

## 2020-04-07 LAB — FIBRINOGEN: Fibrinogen: 422 mg/dL (ref 210–475)

## 2020-04-07 LAB — LACTIC ACID, PLASMA: Lactic Acid, Venous: 1.1 mmol/L (ref 0.5–1.9)

## 2020-04-07 LAB — D-DIMER, QUANTITATIVE
D-Dimer, Quant: 1.33 ug/mL-FEU — ABNORMAL HIGH (ref 0.00–0.50)
D-Dimer, Quant: 1.28 ug/mL-FEU — ABNORMAL HIGH (ref 0.00–0.50)

## 2020-04-07 LAB — PROCALCITONIN: Procalcitonin: 0.42 ng/mL

## 2020-04-07 LAB — PROTIME-INR
INR: 4.2 (ref 0.8–1.2)
Prothrombin Time: 39.2 seconds — ABNORMAL HIGH (ref 11.4–15.2)

## 2020-04-07 LAB — MRSA PCR SCREENING: MRSA by PCR: POSITIVE — AB

## 2020-04-07 LAB — HEPARIN LEVEL (UNFRACTIONATED)
Heparin Unfractionated: 0.37 IU/mL (ref 0.30–0.70)
Heparin Unfractionated: 0.43 IU/mL (ref 0.30–0.70)

## 2020-04-07 LAB — MAGNESIUM: Magnesium: 1.7 mg/dL (ref 1.7–2.4)

## 2020-04-07 LAB — APTT: aPTT: 119 seconds — ABNORMAL HIGH (ref 24–36)

## 2020-04-07 MED ORDER — CHLORHEXIDINE GLUCONATE CLOTH 2 % EX PADS
6.0000 | MEDICATED_PAD | Freq: Every day | CUTANEOUS | Status: DC
  Administered 2020-04-07 – 2020-04-23 (×15): 6 via TOPICAL

## 2020-04-07 MED ORDER — POTASSIUM & SODIUM PHOSPHATES 280-160-250 MG PO PACK
2.0000 | PACK | Freq: Once | ORAL | Status: AC
  Administered 2020-04-07: 2
  Filled 2020-04-07: qty 2

## 2020-04-07 MED ORDER — INSULIN ASPART 100 UNIT/ML ~~LOC~~ SOLN
0.0000 [IU] | SUBCUTANEOUS | Status: DC
  Administered 2020-04-07 – 2020-04-08 (×5): 2 [IU] via SUBCUTANEOUS
  Administered 2020-04-09 (×2): 3 [IU] via SUBCUTANEOUS
  Administered 2020-04-09: 2 [IU] via SUBCUTANEOUS
  Administered 2020-04-09 (×2): 3 [IU] via SUBCUTANEOUS
  Administered 2020-04-09 (×2): 2 [IU] via SUBCUTANEOUS
  Administered 2020-04-10 (×3): 3 [IU] via SUBCUTANEOUS
  Administered 2020-04-10: 2 [IU] via SUBCUTANEOUS
  Administered 2020-04-10 – 2020-04-11 (×2): 3 [IU] via SUBCUTANEOUS
  Administered 2020-04-11: 2 [IU] via SUBCUTANEOUS
  Administered 2020-04-11: 3 [IU] via SUBCUTANEOUS
  Administered 2020-04-11: 5 [IU] via SUBCUTANEOUS
  Administered 2020-04-11 (×2): 3 [IU] via SUBCUTANEOUS
  Administered 2020-04-12: 5 [IU] via SUBCUTANEOUS
  Filled 2020-04-07 (×24): qty 1

## 2020-04-07 MED ORDER — SODIUM PHOSPHATES 45 MMOLE/15ML IV SOLN
45.0000 mmol | Freq: Once | INTRAVENOUS | Status: AC
  Administered 2020-04-07: 45 mmol via INTRAVENOUS
  Filled 2020-04-07: qty 15

## 2020-04-07 MED ORDER — ORAL CARE MOUTH RINSE
15.0000 mL | OROMUCOSAL | Status: DC
  Administered 2020-04-07 – 2020-04-12 (×52): 15 mL via OROMUCOSAL

## 2020-04-07 MED ORDER — HYDROCERIN EX CREA
TOPICAL_CREAM | Freq: Every day | CUTANEOUS | Status: DC
  Administered 2020-04-07: 1 via TOPICAL
  Filled 2020-04-07 (×3): qty 113

## 2020-04-07 MED ORDER — CHLORHEXIDINE GLUCONATE 0.12% ORAL RINSE (MEDLINE KIT)
15.0000 mL | Freq: Two times a day (BID) | OROMUCOSAL | Status: DC
  Administered 2020-04-07 – 2020-04-12 (×11): 15 mL via OROMUCOSAL

## 2020-04-07 MED ORDER — HEPARIN BOLUS VIA INFUSION
3500.0000 [IU] | Freq: Once | INTRAVENOUS | Status: AC
  Administered 2020-04-07: 3500 [IU] via INTRAVENOUS
  Filled 2020-04-07: qty 3500

## 2020-04-07 MED ORDER — SODIUM CHLORIDE 0.9 % IV SOLN
1.0000 g | INTRAVENOUS | Status: DC
  Administered 2020-04-07 – 2020-04-08 (×2): 1 g via INTRAVENOUS
  Filled 2020-04-07 (×2): qty 1

## 2020-04-07 MED ORDER — MAGNESIUM SULFATE 2 GM/50ML IV SOLN
2.0000 g | Freq: Once | INTRAVENOUS | Status: AC
  Administered 2020-04-07: 2 g via INTRAVENOUS
  Filled 2020-04-07: qty 50

## 2020-04-07 MED ORDER — MUPIROCIN 2 % EX OINT
1.0000 "application " | TOPICAL_OINTMENT | Freq: Two times a day (BID) | CUTANEOUS | Status: AC
  Administered 2020-04-07 – 2020-04-11 (×10): 1 via NASAL
  Filled 2020-04-07: qty 22

## 2020-04-07 MED ORDER — METHYLPREDNISOLONE SODIUM SUCC 125 MG IJ SOLR
60.0000 mg | Freq: Two times a day (BID) | INTRAMUSCULAR | Status: DC
  Administered 2020-04-07 – 2020-04-15 (×16): 60 mg via INTRAVENOUS
  Filled 2020-04-07 (×16): qty 2

## 2020-04-07 MED ORDER — FUROSEMIDE 10 MG/ML IJ SOLN
40.0000 mg | Freq: Once | INTRAMUSCULAR | Status: AC
  Administered 2020-04-07: 40 mg via INTRAVENOUS
  Filled 2020-04-07: qty 4

## 2020-04-07 MED ORDER — ARGATROBAN 50 MG/50ML IV SOLN
0.5000 ug/kg/min | INTRAVENOUS | Status: DC
  Administered 2020-04-07 (×2): 0.5 ug/kg/min via INTRAVENOUS
  Filled 2020-04-07 (×2): qty 50

## 2020-04-07 NOTE — Progress Notes (Signed)
ANTICOAGULATION CONSULT NOTE  Pharmacy Consult for heparin Indication: atrial fibrillation  No Known Allergies  Patient Measurements: Height: 5\' 9"  (175.3 cm) Weight: (!) 195.5 kg (431 lb) IBW/kg (Calculated) : 70.7 Heparin Dosing Weight: 117 kg  Vital Signs: Temp: 98.42 F (36.9 C) (02/21 0700) BP: 99/65 (02/21 0700) Pulse Rate: 61 (02/21 0700)  Labs: Recent Labs    04/06/20 1111 04/06/20 1625 04/06/20 2310 04/07/20 0350 04/07/20 0640  HGB 12.4*  --   --  11.5*  --   HCT 38.9*  --   --  35.1*  --   PLT 90*  --   --  72*  --   APTT 34  --   --   --   --   LABPROT 18.2*  --   --   --   --   INR 1.6*  --   --   --   --   HEPARINUNFRC  --   --  0.17*  --  0.37  CREATININE 1.07  --   --  1.12  --   TROPONINIHS 78* 133*  --   --   --     Estimated Creatinine Clearance: 121.1 mL/min (by C-G formula based on SCr of 1.12 mg/dL).   Medical History: Past Medical History:  Diagnosis Date  . Allergy   . Anxiety   . Arthritis   . Asthma   . Brain damage   . Chronic pain of both knees 07/13/2018  . Clotting disorder (HCC)   . COPD (chronic obstructive pulmonary disease) (HCC)   . Depression   . GERD (gastroesophageal reflux disease)   . HFrEF (heart failure with reduced ejection fraction) (HCC)    a. 03/2018 Echo: EF 25-30%, diff HK. Mod LAE.  04/2018 History of DVT (deep vein thrombosis)   . History of pulmonary embolism    a. Chronic coumadin.  [name] Hypertension   . MI (myocardial infarction) (HCC)   . Morbid obesity (HCC)   . Neck pain 07/13/2018  . NICM (nonischemic cardiomyopathy) (HCC)    a. s/p Cath x 3 - reportedly nl cors. Last cath 2019 in [place]; b. a. 03/2018 Echo: EF 25-30%, diff HK.  04/2018 Persistent atrial fibrillation (HCC)    a. 03/2018 s/p DCCV; b. CHA2DS2VASc = 1-->Xarelto (later changed to warfarin); c. 05/2018 recurrent afib-->Amio initiated.  . Sleep apnea   . Sleep apnea     Assessment: 60 year old male presented with SOB. Patient with h/o atrial fibrillation  on warfarin PTA admitted with a subtherapeutic INR. Pharmacy was consulted for heparin for afib. Thrombocytopenia on admission  Goal of Therapy:  Heparin level 0.3-0.7 units/ml Monitor platelets by anticoagulation protocol: Yes   Plan:   Heparin level is therapeutic: continue heparin infusion at  2000 units/hr.   recheck anti-Xa level in 6 hours  46, PharmD 04/07/2020,8:28 AM

## 2020-04-07 NOTE — Progress Notes (Addendum)
Initial Nutrition Assessment  DOCUMENTATION CODES:   Morbid obesity  INTERVENTION:   Once pt appropriate for tube feeds, recommend:  Vital 1.2 @60ml /hr- Initiate at 89ml/hr and increase by 95ml/hr q 12 hours until goal rate is reached  Pro-Source 27ml TID via tube, provides 40kcal and 11g of protein per serving   Propofol: 22.3 ml/hr- provides 589kcal/day   Free water flushes 39ml q4 hours to maintain tube patency   Regimen provides 1968kcal/day, 174g/day protein and 1312ml/day free water (with propofol provides 2557kcal/day)  Pt at high refeed risk; recommend monitor potassium, magnesium and phosphorus labs daily until stable  Juven Fruit Punch BID, each serving provides 95kcal and 2.5g of protein (amino acids glutamine and arginine)  NUTRITION DIAGNOSIS:   Inadequate oral intake related to inability to eat (pt sedated and ventilated) as evidenced by NPO status.  GOAL:   Provide needs based on ASPEN/SCCM guidelines  MONITOR:   Vent status,Labs,Weight trends,Skin,I & O's  REASON FOR ASSESSMENT:   Ventilator    ASSESSMENT:   60 y/o male with h/o HTN, MI, COPD, GERD, OSA, morbid obesity, Afib, CHF, hernia repair and lymphedema who is admitted with UTI and COVID 19   Pt sedated and ventilated. OGT in place to LIS. No plans for feeds today.  Per chart, pt appears weight stable at baseline  Medications reviewed and include: colace, ceftriaxone, pepcid, fentanyl, heparin, propofol, thiamine   Labs reviewed: Na 130(L), BUN 30(H), P <1.0(L), Mg 1.7 wnl cbgs- 153, 121 x 24 hrs AIC 6.7(H)- 12/21  Patient is currently intubated on ventilator support MV: 10.0 L/min Temp (24hrs), Avg:98 F (36.7 C), Min:96.8 F (36 C), Max:98.6 F (37 C)  Propofol: 22.3 ml/hr- provides 589kcal/day   MAP- >69mmHg  UOP- 77m   NUTRITION - FOCUSED PHYSICAL EXAM:  Flowsheet Row Most Recent Value  Orbital Region No depletion  Upper Arm Region No depletion  Thoracic and Lumbar  Region No depletion  Buccal Region No depletion  Temple Region No depletion  Clavicle Bone Region No depletion  Clavicle and Acromion Bone Region No depletion  Scapular Bone Region No depletion  Dorsal Hand No depletion  Patellar Region No depletion  Anterior Thigh Region No depletion  Posterior Calf Region No depletion  Edema (RD Assessment) Severe  Hair Reviewed  Eyes Reviewed  Mouth Reviewed  Skin Reviewed  Nails Reviewed     Diet Order:   Diet Order            Diet NPO time specified  Diet effective now                EDUCATION NEEDS:   No education needs have been identified at this time  Skin:  Skin Assessment: Reviewed RN Assessment (Stage III R buttocks)  Last BM:  pta  Height:   Ht Readings from Last 1 Encounters:  04/06/20 5\' 9"  (1.753 m)    Weight:   Wt Readings from Last 1 Encounters:  04/07/20 (!) 195.5 kg    Ideal Body Weight:  72.7 kg  BMI:  Body mass index is 63.65 kg/m.  Estimated Nutritional Needs:   Kcal:  2436kcal/day  Protein:  182g/day  Fluid:  2.2-2.5L/day  MS, RD, LDN Please refer to [facility] for RD and/or RD on-call/weekend/after hours pager

## 2020-04-07 NOTE — Progress Notes (Signed)
ANTICOAGULATION CONSULT NOTE  Pharmacy Consult for heparin Indication: atrial fibrillation  No Known Allergies  Patient Measurements: Height: 5\' 9"  (175.3 cm) Weight: (!) 193.6 kg (426 lb 13 oz) IBW/kg (Calculated) : 70.7 Heparin Dosing Weight: 117 kg  Vital Signs: Temp: 98.42 F (36.9 C) (02/21 0000) Temp Source: Axillary (02/20 1600) BP: 104/70 (02/21 0000) Pulse Rate: 60 (02/21 0000)  Labs: Recent Labs    04/06/20 1111 04/06/20 1625 04/06/20 2310  HGB 12.4*  --   --   HCT 38.9*  --   --   PLT 90*  --   --   APTT 34  --   --   LABPROT 18.2*  --   --   INR 1.6*  --   --   HEPARINUNFRC  --   --  0.17*  CREATININE 1.07  --   --   TROPONINIHS 78* 133*  --     Estimated Creatinine Clearance: 126.1 mL/min (by C-G formula based on SCr of 1.07 mg/dL).   Medical History: Past Medical History:  Diagnosis Date  . Allergy   . Anxiety   . Arthritis   . Asthma   . Brain damage   . Chronic pain of both knees 07/13/2018  . Clotting disorder (HCC)   . COPD (chronic obstructive pulmonary disease) (HCC)   . Depression   . GERD (gastroesophageal reflux disease)   . HFrEF (heart failure with reduced ejection fraction) (HCC)    a. 03/2018 Echo: EF 25-30%, diff HK. Mod LAE.  04/2018 History of DVT (deep vein thrombosis)   . History of pulmonary embolism    a. Chronic coumadin.  [name] Hypertension   . MI (myocardial infarction) (HCC)   . Morbid obesity (HCC)   . Neck pain 07/13/2018  . NICM (nonischemic cardiomyopathy) (HCC)    a. s/p Cath x 3 - reportedly nl cors. Last cath 2019 in [place]; b. a. 03/2018 Echo: EF 25-30%, diff HK.  04/2018 Persistent atrial fibrillation (HCC)    a. 03/2018 s/p DCCV; b. CHA2DS2VASc = 1-->Xarelto (later changed to warfarin); c. 05/2018 recurrent afib-->Amio initiated.  . Sleep apnea   . Sleep apnea     Assessment: 60 year old male presented with SOB. Patient with h/o atrial fibrillation on warfarin PTA. Pharmacy consult for heparin for afib. Platelets 90 on  admission.  Goal of Therapy:  Heparin level 0.3-0.7 units/ml Monitor platelets by anticoagulation protocol: Yes   Plan:  2/20:  HL @ 2310 = 0.17,  RN stated there have not been any interruptions in heparin infusion.  Will order Heparin 3500 units IV X 1 bolus and increase drip rate to 2000 units/hr.  Will recheck HL 6 hrs after rate change.   Mckensi Redinger D, PharmD 04/07/2020,1:05 AM

## 2020-04-07 NOTE — Consult Note (Signed)
WOC Nurse Consult Note: Chronic lower extremity edema and weeping skin.  Acute heart failure exacerbation and fluid overload.  Will not re-wrap in Unna boots until stabilized to avoid mobilizing fluid.  Reason for Consult:Bialteral lower extremity edema and weeping, fluid overload.  Wound type: inflammatory Pressure Injury POA Stage 3 right buttocks.  Measurement:  2 cm x 2 cm x 0.1 cm  Wound bed:red Drainage (amount, consistency, odor) moderate serous weeping  No odor Periwound: moist Dressing procedure/placement/frequency:  Cleanse legs with soap and water.  Apply Eucerin cream.  Wrap with kerlix and ace wrap.  Change daily.  Silicone foam to buttocks.  Change every three days and PRN soilage.  Will not follow at this time.  Please re-consult if needed.  Maple Hudson MSN, RN, FNP-BC CWON Wound, Ostomy, Continence Nurse Pager [phone]

## 2020-04-07 NOTE — Progress Notes (Signed)
ANTICOAGULATION CONSULT NOTE  Pharmacy Consult for argatroban Indication: atrial fibrillation  No Known Allergies  Patient Measurements: Height: 5\' 9"  (175.3 cm) Weight: (!) 195.5 kg (431 lb) IBW/kg (Calculated) : 70.7 Heparin Dosing Weight: 117 kg  Vital Signs: Temp: 98.78 F (37.1 C) (02/21 1500) Temp Source: Esophageal (02/21 1200) BP: 117/82 (02/21 1500) Pulse Rate: 82 (02/21 1500)  Labs: Recent Labs    04/06/20 1111 04/06/20 1625 04/06/20 2310 04/07/20 0350 04/07/20 0640 04/07/20 1224  HGB 12.4*  --   --  11.5*  --   --   HCT 38.9*  --   --  35.1*  --   --   PLT 90*  --   --  72*  --   --   APTT 34  --   --   --   --   --   LABPROT 18.2*  --   --   --   --   --   INR 1.6*  --   --   --   --   --   HEPARINUNFRC  --   --  0.17*  --  0.37 0.43  CREATININE 1.07  --   --  1.12  --   --   TROPONINIHS 78* 133*  --   --   --  87*    Estimated Creatinine Clearance: 121.1 mL/min (by C-G formula based on SCr of 1.12 mg/dL).   Medical History: Past Medical History:  Diagnosis Date  . Allergy   . Anxiety   . Arthritis   . Asthma   . Brain damage   . Chronic pain of both knees 07/13/2018  . Clotting disorder (HCC)   . COPD (chronic obstructive pulmonary disease) (HCC)   . Depression   . GERD (gastroesophageal reflux disease)   . HFrEF (heart failure with reduced ejection fraction) (HCC)    a. 03/2018 Echo: EF 25-30%, diff HK. Mod LAE.  04/2018 History of DVT (deep vein thrombosis)   . History of pulmonary embolism    a. Chronic coumadin.  [name] Hypertension   . MI (myocardial infarction) (HCC)   . Morbid obesity (HCC)   . Neck pain 07/13/2018  . NICM (nonischemic cardiomyopathy) (HCC)    a. s/p Cath x 3 - reportedly nl cors. Last cath 2019 in [place]; b. a. 03/2018 Echo: EF 25-30%, diff HK.  04/2018 Persistent atrial fibrillation (HCC)    a. 03/2018 s/p DCCV; b. CHA2DS2VASc = 1-->Xarelto (later changed to warfarin); c. 05/2018 recurrent afib-->Amio initiated.  . Sleep apnea   .  Sleep apnea     Assessment: 60 year old male presented with SOB. Patient with h/o atrial fibrillation on warfarin PTA admitted with a subtherapeutic INR. Pharmacy was consulted for heparin for afib. Thrombocytopenia on admission noted and slightly worse with bleeding found at multiple sites. LFTs, bilirubin and INR are all noted to be elevated at baseline, however Child-Pugh Score is 5 (class A)  Goal of Therapy:  aPTT 50 - 90 seconds Monitor platelets by anticoagulation protocol: Yes   Plan:   Start argatroban at 0.5 mg/kg/hr  Check aPTT 4 hours after beginning infusion  Repeat CBC in am  46, PharmD 04/07/2020,3:45 PM

## 2020-04-07 NOTE — Progress Notes (Signed)
ANTICOAGULATION CONSULT NOTE  Pharmacy Consult for heparin Indication: atrial fibrillation  No Known Allergies  Patient Measurements: Height: 5\' 9"  (175.3 cm) Weight: (!) 195.5 kg (431 lb) IBW/kg (Calculated) : 70.7 Heparin Dosing Weight: 117 kg  Vital Signs: Temp: 98.24 F (36.8 C) (02/21 1300) Temp Source: Esophageal (02/21 1200) BP: 111/68 (02/21 1300) Pulse Rate: 71 (02/21 1300)  Labs: Recent Labs    04/06/20 1111 04/06/20 1625 04/06/20 2310 04/07/20 0350 04/07/20 0640 04/07/20 1224  HGB 12.4*  --   --  11.5*  --   --   HCT 38.9*  --   --  35.1*  --   --   PLT 90*  --   --  72*  --   --   APTT 34  --   --   --   --   --   LABPROT 18.2*  --   --   --   --   --   INR 1.6*  --   --   --   --   --   HEPARINUNFRC  --   --  0.17*  --  0.37 0.43  CREATININE 1.07  --   --  1.12  --   --   TROPONINIHS 78* 133*  --   --   --  87*    Estimated Creatinine Clearance: 121.1 mL/min (by C-G formula based on SCr of 1.12 mg/dL).   Medical History: Past Medical History:  Diagnosis Date  . Allergy   . Anxiety   . Arthritis   . Asthma   . Brain damage   . Chronic pain of both knees 07/13/2018  . Clotting disorder (HCC)   . COPD (chronic obstructive pulmonary disease) (HCC)   . Depression   . GERD (gastroesophageal reflux disease)   . HFrEF (heart failure with reduced ejection fraction) (HCC)    a. 03/2018 Echo: EF 25-30%, diff HK. Mod LAE.  04/2018 History of DVT (deep vein thrombosis)   . History of pulmonary embolism    a. Chronic coumadin.  [name] Hypertension   . MI (myocardial infarction) (HCC)   . Morbid obesity (HCC)   . Neck pain 07/13/2018  . NICM (nonischemic cardiomyopathy) (HCC)    a. s/p Cath x 3 - reportedly nl cors. Last cath 2019 in [place]; b. a. 03/2018 Echo: EF 25-30%, diff HK.  04/2018 Persistent atrial fibrillation (HCC)    a. 03/2018 s/p DCCV; b. CHA2DS2VASc = 1-->Xarelto (later changed to warfarin); c. 05/2018 recurrent afib-->Amio initiated.  . Sleep apnea   . Sleep  apnea     Assessment: 60 year old male presented with SOB. Patient with h/o atrial fibrillation on warfarin PTA admitted with a subtherapeutic INR. Pharmacy was consulted for heparin for afib. Thrombocytopenia on admission noted  Goal of Therapy:  Heparin level 0.3-0.7 units/ml Monitor platelets by anticoagulation protocol: Yes   Plan:   Heparin level is therapeutic. This is the second consecutive therapeutic heparin level: continue heparin infusion at  2000 units/hr  recheck anti-Xa level once daily with am labs  Repeat CBC in am  46, PharmD 04/07/2020,1:56 PM

## 2020-04-07 NOTE — Progress Notes (Signed)
NAME:  [name] [name], MRN:  [id number], DOB:  01-Jan-1961, LOS: 1 ADMISSION DATE:  04/06/2020, CONSULTATION DATE:  04/07/2019 REFERRING MD:  Dr. Tamala Julian, CHIEF COMPLAINT:  Shortness of Breath; s/p Cardiac Arrest  Brief History:  60. Y.o. Male admitted with Acute on Chronic Respiratory Failure in the setting of COVID-19 Pneumonia. Initially placed on BiPAP.  Suffered Cardiac arrest (asystole) with ROSC obtained in 10 minutes.  History of Present Illness:  Patient is a 60 year old male with supra morbid obesity, obesity hypoventilation syndrome and chronic hypoxic respiratory failure due to the same who presented to [facility] ED due to increased shortness of breath 4 hours prior to admission. His past medical history is as noted below. Per history he had myalgias and cough. He had been on his CPAP prior to presenting to the emergency room but found no relief from it.  Patient was being evaluated in the emergency room and was placed on BiPAP.  However shortly after being placed on BiPAP he became unresponsive and pulseless.  He was noted to be in asystole and received ACLS protocols for approximately 10 minutes until ROSC was obtained.  He was intubated during the procedure.  He is now intubated and mechanically ventilated.  No further history can be obtained.  In the ED laboratory data has no surface patient is COVID 19 positive.  Past Medical History:  Sleep apnea COPD  Atrial fibrillation Nonischemic cardiomyopathy Myocardial infarction HFrEF Hypertension DVT GERD  Depression Arthritis Chronic pain Morbid obesity   Significant Hospital Events:  2/20: Presented to ED; suffered Respiratory/Cardiac arrest (10 minutes of ACLS before ROSC); intubated 2/21: FiO2 weaned to 45%; Diuresis today  Consults:  Wound Care  Procedures:  2/20: Tracheal intubation 2/20: Right IJ CVC placed  Significant Diagnostic Tests:  2/20: CTA Chest>>1. No obvious large central pulmonary artery embolus  identified. 2. Probable trace bilateral pleural effusions. Bilateral lower lobe subsegmental and streaky densities may represent atelectasis or scarring. Infiltrate is not excluded. 3. Aortic Atherosclerosis 2/21: Echocardiogram>>1. Left ventricular ejection fraction, by estimation, is 30 to 35%. Left  ventricular ejection fraction by PLAX is 21 %. The left ventricle has  moderate to severely decreased function. Left ventricular endocardial  border not optimally defined to evaluate  regional wall motion. The left ventricular internal cavity size was  mildly dilated. Left ventricular diastolic parameters were normal.  2. Right ventricular systolic function is normal. The right ventricular  size is mildly enlarged.  3. Left atrial size was mildly dilated.  4. Right atrial size was mildly dilated.  5. The mitral valve was not well visualized. No evidence of mitral valve  regurgitation.  6. The aortic valve was not well visualized. Aortic valve regurgitation  is not visualized.  Micro Data:  2/20: SARS-CoV-2 PCR>> positive 2/20: Influenza A/B PCR>>negative 2/20: Blood culture x2>> 2/20: Urine>>  Antimicrobials:  Remdesivir 2/20>>  Interim History / Subjective:  Pt suffered Respiratory/Cardiac arrest yesterday in ED, emergently intubated Initially @ 60% FiO2 this morning, have weaned to 45% and tolerating Sedated with fentanyl and propofol ~ follows commands when sedation lightened Hemodynamically stable, afebrile, NO vasopressors   Objective   Blood pressure 99/65, pulse 61, temperature 98.42 F (36.9 C), resp. rate 20, height 5' 9"  (1.753 m), weight (!) 195.5 kg, SpO2 95 %.    Vent Mode: PRVC FiO2 (%):  [45 %-65 %] 45 % Set Rate:  [20 bmp] 20 bmp Vt Set:  [500 mL] 500 mL PEEP:  [5 cmH20-10 cmH20] 8 cmH20 Plateau Pressure:  [  6 cmH20-23 cmH20] 23 cmH20   Intake/Output Summary (Last 24 hours) at 04/07/2020 0920 Last data filed at 04/07/2020 7341 Gross per 24 hour   Intake 2004.15 ml  Output 1000 ml  Net 1004.15 ml   Filed Weights   04/06/20 1114 04/06/20 1603 04/07/20 0343  Weight: (!) 186 kg (!) 193.6 kg (!) 195.5 kg    Examination: General: Acute on chronically ill-appearing male, laying in bed, intubated sedated, no acute distress HENT: Atraumatic, normocephalic, neck supple, no JVD, pupils PERRLA, no scleral icterus, ET tube in place Lungs: Coarse breath sounds bilaterally, synchronous with the ventilator, even Cardiovascular: Irregular irregular rhythm, rate controlled, atrial fibrillation on telemetry, no murmurs, rubs, gallops Abdomen: Obese, soft, nondistended, nontender Extremities: Lymphedema bilateral lower extremities, 3-4+ pitting Neuro: Sedated on fentanyl and propofol, follows commands when sedation lightened GU: Foley catheter in place Skin: Warm and dry.  Chronic venous stasis and venous ulcers lower extremities with weeping, significant lymphedema  Resolved Hospital Problem list   N/A  Assessment & Plan:   Acute on chronic respiratory failure with hypoxia and hypercarbia in the setting of COVID-19 infection & Pulmonary Vascular Congestion Status post Respiratory Arrest Underlying Obesity with Obesity Hypoventilation syndrome -Mechanical ventilation via ARDS protocol, target PRVC 6 cc/kg -Wean PEEP and FiO2 as able to maintain O2 sats >88% -Goal plateau pressure less than 30, driving pressure less than 15 -Paralytics if necessary for vent synchrony, gas exchange -Cycle prone positioning if necessary for oxygenation -Deep sedation per PAD protocol, goal RASS -4, currently fentanyl, midazolam -Follow intermittent CXR and ABG as needed -Spontaneous breathing trials when respiratory parameters met -Diuresis as blood pressure and renal function can tolerate, goal CVP 5-8.   -VAP prevention order set -Remdesivir, plan for 5 days -IV Steroids (Solumedrol 60 mg BID) -Follow inflammatory markers: Ferritin, D-dimer,  CRP -Assess for possible tocilizumab  -Vitamin C, zinc -Obtain Tracheal aspirate -CTA Chest on 2/20 negative for PE   Cardiac arrest (asysole, ROSC obtained in 10 minutes), suspect due to Respiratory Arrest Troponin elevation, likely demand ischemia Acute on Chronic HFrEF (LVEF 30-35%) -Continuous cardiac monitoring -Maintain MAP >65 -Trend Troponin until downtrending (78 ~ 133 ~ 87 ) -Will transition to Argatroban for anticoagulation due to thrombocytopenia -Echocardiogram pending~ LVEF 30-35%, normal LV diastolic parameters -Diuresis as BP and renal function permits -Consider Cardiology consult   UTI -Monitor fever curve -Trend WBC's and procalcitonin -Follow cultures (Blood, urine, tracheal aspirate) -Place on empiric Ceftriaxone for now pending cultures and sensitivities   Hyponatremia/hypochloremia due to Volume Overload -Monitor I&O's / urinary output -Follow BMP -Ensure adequate renal perfusion -Avoid nephrotoxic agents as able -Replace electrolytes as indicated -Diuresis and Renal Function and BP permits ~ will give 40 mg IV Lasix today x1 dose 2/21 -Pharmacy consulted for assistance in electrolyte replacement   Anemia Thrombocytopenia -Monitor for S/Sx of bleeding -Trend CBC -Will transition from Heparin to Argatroban for Anticoagulation/VTE Prophylaxis due to thrombocytopenia -Check HIT antibody and Serotonin release assay -Check DIC panel -Transfuse for Hgb <7 -Transfuse platelets for platelet count < 50K with active bleeding   Hyperglycemia -CBG's -SSI -Follow ICU Hypo/Hyperglycemia protocol   Super morbid obesity OSA/OHVS -This issue adds complexity to his management -We will use higher PEEP to avoid alveolar derecruiting -Will make weaning a challenge  Lymphedema Wound Care Nurse consult, appreciate input, will follow recommendations   Best practice (evaluated daily)  Diet: NPO Pain/Anxiety/Delirium protocol (if indicated): Propofol,  Fentanyl gtts VAP protocol (if indicated): Yes, implemented DVT prophylaxis: Argatroban GI  prophylaxis: Pepcid Glucose control: SSI Mobility: Bedrest Disposition:ICU  Goals of Care:  Last date of multidisciplinary goals of care discussion:04/07/2020 Family and staff present: Bedside RN.  No family available Summary of discussion: Wake up assessment, weaning FiO2, diuresis Follow up goals of care discussion due: 04/08/2020 Code Status: Full code  Labs   CBC: Recent Labs  Lab 04/06/20 1111 04/07/20 0350  WBC 8.0 4.7  NEUTROABS 7.1  --   HGB 12.4* 11.5*  HCT 38.9* 35.1*  MCV 96.8 95.4  PLT 90* 72*    Basic Metabolic Panel: Recent Labs  Lab 04/06/20 1111 04/07/20 0350  NA 129* 130*  K 3.8 4.1  CL 88* 90*  CO2 24 29  GLUCOSE 170* 168*  BUN 27* 30*  CREATININE 1.07 1.12  CALCIUM 7.5* 7.3*  MG  --  1.7  PHOS  --  <1.0*   GFR: Estimated Creatinine Clearance: 121.1 mL/min (by C-G formula based on SCr of 1.12 mg/dL). Recent Labs  Lab 04/06/20 1111 04/06/20 2320 04/07/20 0350  PROCALCITON 0.23  --   --   WBC 8.0  --  4.7  LATICACIDVEN 3.5* 1.4 1.1    Liver Function Tests: Recent Labs  Lab 04/06/20 1111  AST 144*  ALT 48*  ALKPHOS 99  BILITOT 1.4*  PROT 7.6  ALBUMIN 3.5   No results for input(s): LIPASE, AMYLASE in the last 168 hours. No results for input(s): AMMONIA in the last 168 hours.  ABG    Component Value Date/Time   PHART 7.45 04/06/2020 1900   PCO2ART 43 04/06/2020 1900   PO2ART 117 (H) 04/06/2020 1900   HCO3 29.9 (H) 04/06/2020 1900   ACIDBASEDEF 5.4 (H) 04/06/2020 1120   O2SAT 98.7 04/06/2020 1900     Coagulation Profile: Recent Labs  Lab 04/06/20 1111  INR 1.6*    Cardiac Enzymes: No results for input(s): CKTOTAL, CKMB, CKMBINDEX, TROPONINI in the last 168 hours.  HbA1C: HB A1C (BAYER DCA - WAIVED)  Date/Time Value Ref Range Status  01/05/2019 04:09 PM 6.0 <7.0 % Final    Comment:                                           Diabetic Adult            <7.0                                       Healthy Adult        4.3 - 5.7                                                           (DCCT/NGSP) American Diabetes Association's Summary of Glycemic Recommendations for Adults with Diabetes: Hemoglobin A1c <7.0%. More stringent glycemic goals (A1c <6.0%) may further reduce complications at the cost of increased risk of hypoglycemia.   10/17/2018 01:28 PM 5.7 <7.0 % Final    Comment:  Diabetic Adult            <7.0                                       Healthy Adult        4.3 - 5.7                                                           (DCCT/NGSP) American Diabetes Association's Summary of Glycemic Recommendations for Adults with Diabetes: Hemoglobin A1c <7.0%. More stringent glycemic goals (A1c <6.0%) may further reduce complications at the cost of increased risk of hypoglycemia.    Hgb A1c MFr Bld  Date/Time Value Ref Range Status  01/27/2020 04:54 AM 6.7 (H) 4.8 - 5.6 % Final    Comment:    (NOTE) Pre diabetes:          5.7%-6.4%  Diabetes:              >6.4%  Glycemic control for   <7.0% adults with diabetes   11/08/2019 04:46 AM 6.2 (H) 4.8 - 5.6 % Final    Comment:    (NOTE) Pre diabetes:          5.7%-6.4%  Diabetes:              >6.4%  Glycemic control for   <7.0% adults with diabetes     CBG: Recent Labs  Lab 04/06/20 1554 04/07/20 0750  GLUCAP 175* 153*    Review of Systems:   Unable to assess due to intubation and sedation  Past Medical History:  He,  has a past medical history of Allergy, Anxiety, Arthritis, Asthma, Brain damage, Chronic pain of both knees (07/13/2018), Clotting disorder ([place]), COPD (chronic obstructive pulmonary disease) ([place]), Depression, GERD (gastroesophageal reflux disease), HFrEF (heart failure with reduced ejection fraction) ([place]), History of DVT (deep vein thrombosis), History of pulmonary embolism, Hypertension,  MI (myocardial infarction) ([place]), Morbid obesity ([place]), Neck pain (07/13/2018), NICM (nonischemic cardiomyopathy) ([place]), Persistent atrial fibrillation ([place]), Sleep apnea, and Sleep apnea.   Surgical History:   Past Surgical History:  Procedure Laterality Date  . CARDIOVERSION N/A 03/24/2018   Procedure: CARDIOVERSION;  Surgeon: Minna Merritts, MD;  Location: [facility] ORS;  Service: Cardiovascular;  Laterality: N/A;  . CARDIOVERSION N/A 08/08/2018   Procedure: CARDIOVERSION;  Surgeon: Nelva Bush, MD;  Location: [facility] ORS;  Service: Cardiovascular;  Laterality: N/A;  . hearnia repair     X 3- total of two surgeries  . HERNIA REPAIR    . LEG SURGERY       Social History:   reports that he has never smoked. He has never used smokeless tobacco. He reports current alcohol use. He reports that he does not use drugs.   Family History:  His family history includes Heart attack in his maternal grandfather and maternal grandmother; Heart failure in his mother; Lung cancer in his father and mother.   Allergies No Known Allergies   Home Medications  Prior to Admission medications   Medication Sig Start Date End Date Taking? Authorizing Provider  DULoxetine (CYMBALTA) 20 MG capsule Take 1 capsule (20 mg total) by mouth daily. 10/25/19  Yes Karamalegos, Devonne Doughty, DO  levothyroxine (SYNTHROID)  75 MCG tablet Take 1 tablet (75 mcg total) by mouth daily before breakfast. 01/13/20  Yes Fritzi Mandes, MD  meclizine (ANTIVERT) 25 MG tablet Take 0.5-1 tablets (12.5-25 mg total) by mouth 3 (three) times daily as needed for dizziness. 02/26/20  Yes Karamalegos, Devonne Doughty, DO  torsemide (DEMADEX) 20 MG tablet Take 4 tablets (80 mg total) by mouth 2 (two) times daily. 02/02/20  Yes Nicole Kindred A, DO  warfarin (COUMADIN) 5 MG tablet Take 5 mg by mouth daily.  09/20/19  Yes [provider]  albuterol (VENTOLIN HFA) 108 (90 Base) MCG/ACT inhaler Inhale 2 puffs into the lungs every 4 (four) hours as  needed for wheezing or shortness of breath. Patient not taking: No sig reported 10/25/19   [name], DO  amiodarone (PACERONE) 200 MG tablet Take 1 tablet (200 mg total) by mouth 2 (two) times daily. Patient not taking: Reported on 04/06/2020 01/12/20   Fritzi Mandes, MD  aspirin 81 MG chewable tablet Chew 1 tablet (81 mg total) by mouth daily. 02/03/20   Nicole Kindred A, DO  atorvastatin (LIPITOR) 40 MG tablet TAKE 1 TABLET BY MOUTH ONCE DAILY Patient taking differently: Take 40 mg by mouth daily. 08/15/19   Flinchum, Kelby Aline, FNP  carisoprodol (SOMA) 350 MG tablet Take 1 tablet (350 mg total) by mouth 3 (three) times daily as needed for muscle spasms. Patient not taking: No sig reported 02/02/20   Nicole Kindred A, DO  gabapentin (NEURONTIN) 300 MG capsule Take 300 mg by mouth 3 (three) times daily. 02/15/20   [provider]  lisinopril (ZESTRIL) 2.5 MG tablet Take by mouth. 03/06/19   [provider]  loperamide (IMODIUM) 2 MG capsule Take 1 capsule (2 mg total) by mouth as needed for diarrhea or loose stools. 06/26/19   Lorella Nimrod, MD  magnesium oxide (MAG-OX) 400 (241.3 Mg) MG tablet Take 1 tablet (400 mg total) by mouth 2 (two) times daily. 01/12/20   Fritzi Mandes, MD  metoprolol succinate (TOPROL-XL) 50 MG 24 hr tablet Take 2 tablets (100 mg total) by mouth daily. Take with or immediately following a meal. 01/12/20   Fritzi Mandes, MD  montelukast (SINGULAIR) 10 MG tablet Take 1 tablet (10 mg total) by mouth at bedtime. 10/12/19   Karamalegos, Devonne Doughty, DO  Multiple Vitamin (MULTIVITAMIN WITH MINERALS) TABS tablet Take 1 tablet by mouth daily.    [provider]  naphazoline-glycerin (CLEAR EYES REDNESS) 0.012-0.2 % SOLN Place 1-2 drops into both eyes 4 (four) times daily as needed for eye irritation. 08/14/19   Loletha Grayer, MD  nitroGLYCERIN (NITROSTAT) 0.4 MG SL tablet Place 1 tablet under tongue every 5 minutes as needed for chest pain. (No  more than 3 doses within 15 minutes) 03/08/19   Flinchum, Kelby Aline, FNP  potassium chloride (KLOR-CON) 20 MEQ packet Take 40 mEq by mouth daily. Patient not taking: Reported on 04/06/2020 01/12/20   Fritzi Mandes, MD  SUMAtriptan (IMITREX) 50 MG tablet Take 1 tablet (50 mg total) by mouth every 2 (two) hours as needed for migraine or headache. May repeat in 2 hours if headache persists or recurs. No more than 2 doses in a day 12/28/19   Jennye Boroughs, MD  thiamine 100 MG tablet Take 1 tablet (100 mg total) by mouth daily. Patient not taking: Reported on 04/06/2020 08/14/19   Loletha Grayer, MD  traZODone (DESYREL) 150 MG tablet Take 1 tablet (150 mg total) by mouth at bedtime. 10/25/19   Nobie Putnam  J, DO     Critical care time: 35 minutes    Darel Hong, [facility] [place] [facility] Pager: [phone]

## 2020-04-07 NOTE — Progress Notes (Signed)
*  PRELIMINARY RESULTS* Echocardiogram 2D Echocardiogram has been performed.  [name] [name] 04/07/2020, 9:41 AM

## 2020-04-08 ENCOUNTER — Ambulatory Visit: Payer: Medicaid Other | Admitting: Occupational Therapy

## 2020-04-08 ENCOUNTER — Inpatient Hospital Stay: Payer: Medicaid Other

## 2020-04-08 DIAGNOSIS — U071 COVID-19: Secondary | ICD-10-CM | POA: Diagnosis not present

## 2020-04-08 LAB — CBC WITH DIFFERENTIAL/PLATELET
Abs Immature Granulocytes: 0.06 10*3/uL (ref 0.00–0.07)
Basophils Absolute: 0 10*3/uL (ref 0.0–0.1)
Basophils Relative: 0 %
Eosinophils Absolute: 0 10*3/uL (ref 0.0–0.5)
Eosinophils Relative: 0 %
HCT: 33.2 % — ABNORMAL LOW (ref 39.0–52.0)
Hemoglobin: 10.8 g/dL — ABNORMAL LOW (ref 13.0–17.0)
Immature Granulocytes: 1 %
Lymphocytes Relative: 9 %
Lymphs Abs: 0.5 10*3/uL — ABNORMAL LOW (ref 0.7–4.0)
MCH: 31.4 pg (ref 26.0–34.0)
MCHC: 32.5 g/dL (ref 30.0–36.0)
MCV: 96.5 fL (ref 80.0–100.0)
Monocytes Absolute: 0.4 10*3/uL (ref 0.1–1.0)
Monocytes Relative: 7 %
Neutro Abs: 4.4 10*3/uL (ref 1.7–7.7)
Neutrophils Relative %: 83 %
Platelets: 77 10*3/uL — ABNORMAL LOW (ref 150–400)
RBC: 3.44 MIL/uL — ABNORMAL LOW (ref 4.22–5.81)
RDW: 17.8 % — ABNORMAL HIGH (ref 11.5–15.5)
Smear Review: NORMAL
WBC: 5.3 10*3/uL (ref 4.0–10.5)
nRBC: 0.4 % — ABNORMAL HIGH (ref 0.0–0.2)

## 2020-04-08 LAB — PROTIME-INR
INR: 3.6 — ABNORMAL HIGH (ref 0.8–1.2)
Prothrombin Time: 34.9 seconds — ABNORMAL HIGH (ref 11.4–15.2)

## 2020-04-08 LAB — BASIC METABOLIC PANEL
Anion gap: 12 (ref 5–15)
BUN: 31 mg/dL — ABNORMAL HIGH (ref 6–20)
CO2: 27 mmol/L (ref 22–32)
Calcium: 7.1 mg/dL — ABNORMAL LOW (ref 8.9–10.3)
Chloride: 95 mmol/L — ABNORMAL LOW (ref 98–111)
Creatinine, Ser: 1.06 mg/dL (ref 0.61–1.24)
GFR, Estimated: >60 mL/min (ref >60)
Glucose, Bld: 145 mg/dL — ABNORMAL HIGH (ref 70–99)
Potassium: 3.9 mmol/L (ref 3.5–5.1)
Sodium: 134 mmol/L — ABNORMAL LOW (ref 135–145)

## 2020-04-08 LAB — URINE CULTURE: Culture: 20000 — AB

## 2020-04-08 LAB — HEMOGLOBIN A1C
Hgb A1c MFr Bld: 6.4 % — ABNORMAL HIGH (ref 4.8–5.6)
Mean Plasma Glucose: 136.98 mg/dL

## 2020-04-08 LAB — MAGNESIUM: Magnesium: 2 mg/dL (ref 1.7–2.4)

## 2020-04-08 LAB — GLUCOSE, CAPILLARY
Glucose-Capillary: 120 mg/dL — ABNORMAL HIGH (ref 70–99)
Glucose-Capillary: 123 mg/dL — ABNORMAL HIGH (ref 70–99)
Glucose-Capillary: 129 mg/dL — ABNORMAL HIGH (ref 70–99)
Glucose-Capillary: 144 mg/dL — ABNORMAL HIGH (ref 70–99)
Glucose-Capillary: 151 mg/dL — ABNORMAL HIGH (ref 70–99)
Glucose-Capillary: 155 mg/dL — ABNORMAL HIGH (ref 70–99)

## 2020-04-08 LAB — C-REACTIVE PROTEIN: CRP: 13.8 mg/dL — ABNORMAL HIGH (ref <1.0)

## 2020-04-08 LAB — PATHOLOGIST SMEAR REVIEW

## 2020-04-08 LAB — APTT
aPTT: 91 seconds — ABNORMAL HIGH (ref 24–36)
aPTT: 73 seconds — ABNORMAL HIGH (ref 24–36)

## 2020-04-08 LAB — PROCALCITONIN: Procalcitonin: 0.55 ng/mL

## 2020-04-08 LAB — PHOSPHORUS: Phosphorus: 2.1 mg/dL — ABNORMAL LOW (ref 2.5–4.6)

## 2020-04-08 MED ORDER — ARGATROBAN 50 MG/50ML IV SOLN
0.2000 ug/kg/min | INTRAVENOUS | Status: DC
  Administered 2020-04-08 – 2020-04-10 (×4): 0.2 ug/kg/min via INTRAVENOUS
  Filled 2020-04-08 (×2): qty 50

## 2020-04-08 MED ORDER — POTASSIUM CHLORIDE 20 MEQ PO PACK
20.0000 meq | PACK | ORAL | Status: AC
  Administered 2020-04-08 (×2): 20 meq
  Filled 2020-04-08 (×2): qty 1

## 2020-04-08 MED ORDER — POTASSIUM CHLORIDE 10 MEQ/50ML IV SOLN
10.0000 meq | INTRAVENOUS | Status: AC
  Administered 2020-04-08 (×4): 10 meq via INTRAVENOUS
  Filled 2020-04-08 (×4): qty 50

## 2020-04-08 MED ORDER — FUROSEMIDE 10 MG/ML IJ SOLN
40.0000 mg | Freq: Once | INTRAMUSCULAR | Status: AC
  Administered 2020-04-08: 40 mg via INTRAVENOUS
  Filled 2020-04-08: qty 4

## 2020-04-08 MED ORDER — POTASSIUM & SODIUM PHOSPHATES 280-160-250 MG PO PACK
2.0000 | PACK | Freq: Once | ORAL | Status: AC
  Administered 2020-04-08: 2
  Filled 2020-04-08: qty 2

## 2020-04-08 MED ORDER — LINEZOLID 600 MG/300ML IV SOLN
600.0000 mg | Freq: Two times a day (BID) | INTRAVENOUS | Status: DC
  Administered 2020-04-08 – 2020-04-10 (×5): 600 mg via INTRAVENOUS
  Filled 2020-04-08 (×6): qty 300

## 2020-04-08 MED ORDER — ARGATROBAN 50 MG/50ML IV SOLN
0.2500 ug/kg/min | INTRAVENOUS | Status: DC
  Administered 2020-04-08: 0.25 ug/kg/min via INTRAVENOUS
  Filled 2020-04-08: qty 50

## 2020-04-08 NOTE — Progress Notes (Signed)
[facility] ADULT ICU REPLACEMENT PROTOCOL   The patient does apply for the [facility] Adult ICU Electrolyte Replacment Protocol based on the criteria listed below:   1. Is GFR >/= 30 ml/min? Yes.    Patient's GFR today is >60 2. Is SCr </= 2? Yes.   Patient's SCr is 1.13 ml/kg/hr 3. Did SCr increase >/= 0.5 in 24 hours? No. 4. Abnormal electrolyte(s):  K 3.3 5. Ordered repletion with: protocol 6. If a panic level lab has been reported, has the CCM MD in charge been notified? Yes.  .   Physician:  S. Bobbye Morton R Hazely Sealey 04/08/2020 6:18 AM

## 2020-04-08 NOTE — Progress Notes (Signed)
NAME:  [name] [name], MRN:  [id number], DOB:  Aug 21, 1960, LOS: 2 ADMISSION DATE:  04/06/2020, CONSULTATION DATE:  04/07/2019 REFERRING MD:  Dr. Tamala Julian, CHIEF COMPLAINT:  Shortness of Breath; s/p Cardiac Arrest  Brief History:  71. Y.o. Male admitted with Acute on Chronic Respiratory Failure in the setting of COVID-19 Pneumonia. Initially placed on BiPAP.  Suffered Cardiac arrest (asystole) with ROSC obtained in 10 minutes.  History of Present Illness:  Patient is a 60 year old male with supra morbid obesity, obesity hypoventilation syndrome and chronic hypoxic respiratory failure due to the same who presented to [facility] ED due to increased shortness of breath 4 hours prior to admission. His past medical history is as noted below. Per history he had myalgias and cough. He had been on his CPAP prior to presenting to the emergency room but found no relief from it.  Patient was being evaluated in the emergency room and was placed on BiPAP.  However shortly after being placed on BiPAP he became unresponsive and pulseless.  He was noted to be in asystole and received ACLS protocols for approximately 10 minutes until ROSC was obtained.  He was intubated during the procedure.  He is now intubated and mechanically ventilated.  No further history can be obtained.  In the ED laboratory data has no surface patient is COVID 19 positive.  Past Medical History:  Sleep apnea COPD  Atrial fibrillation Nonischemic cardiomyopathy Myocardial infarction HFrEF Hypertension DVT GERD  Depression Arthritis Chronic pain Morbid obesity   Significant Hospital Events:  2/20: Presented to ED; suffered Respiratory/Cardiac arrest (10 minutes of ACLS before ROSC); intubated 2/21: FiO2 weaned to 45%; Diuresis today 2/22: Failed SBT (pt with increased work of breathing and tachycardia 150's); will Diurese again today; Urine culture with Vancomycin Resistant Enterococcus Faecium~placed on Zyvox  Consults:  Wound  Care  Procedures:  2/20: Tracheal intubation 2/20: Right IJ CVC placed  Significant Diagnostic Tests:  2/20: CTA Chest>>1. No obvious large central pulmonary artery embolus identified. 2. Probable trace bilateral pleural effusions. Bilateral lower lobe subsegmental and streaky densities may represent atelectasis or scarring. Infiltrate is not excluded. 3. Aortic Atherosclerosis 2/21: Echocardiogram>>1. Left ventricular ejection fraction, by estimation, is 30 to 35%. Left  ventricular ejection fraction by PLAX is 21 %. The left ventricle has  moderate to severely decreased function. Left ventricular endocardial  border not optimally defined to evaluate  regional wall motion. The left ventricular internal cavity size was  mildly dilated. Left ventricular diastolic parameters were normal.  2. Right ventricular systolic function is normal. The right ventricular  size is mildly enlarged.  3. Left atrial size was mildly dilated.  4. Right atrial size was mildly dilated.  5. The mitral valve was not well visualized. No evidence of mitral valve  regurgitation.  6. The aortic valve was not well visualized. Aortic valve regurgitation  is not visualized.  Micro Data:  2/20: SARS-CoV-2 PCR>> positive 2/20: Influenza A/B PCR>>negative 2/20: Blood culture x2>> 2/20: Urine>>20,000 COLONIES/mL ENTEROCOCCUS FAECIUM  VANCOMYCIN RESISTANT ENTEROCOCCUS ISOLATED  2/21: MRSA PCR>> positive 2/21: Tracheal aspirate>>  Antimicrobials:  Remdesivir 2/20>> Ceftriaxone 2/21>>2/22 Linezolid 2/22>>  Interim History / Subjective:  No acute events reported overnight FiO2 weaned to 40% this morning, will plan on SBT~ failed SBT (increased WOB and tachycardia 150's) Urine output 2.6L yesterday following administration of 40 Lasix x1 Afebrile, hemodynamically stable, A-fib with rate controlled Urine culture growing ENTEROCOCCUS FAECIUM (vancomycin resistant)   Objective   Blood pressure 105/67,  pulse 68, temperature 98.24  F (36.8 C), resp. rate 20, height _0  (1.753 m), weight (!) 194.4 kg, SpO2 95 %.    Vent Mode: PRVC FiO2 (%):  [40 %-45 %] 40 % Set Rate:  [20 bmp] 20 bmp Vt Set:  [500 mL] 500 mL PEEP:  [8 cmH20] 8 cmH20   Intake/Output Summary (Last 24 hours) at 04/08/2020 0835 Last data filed at 04/08/2020 0831 Gross per 24 hour  Intake 2101.6 ml  Output 3135 ml  Net -1033.4 ml   Filed Weights   04/06/20 1603 04/07/20 0343 04/08/20 0500  Weight: (!) 193.6 kg (!) 195.5 kg (!) 194.4 kg    Examination: General: Acute on chronically ill-appearing male, laying in bed, intubated and sedated, no acute distress  HENT: Atraumatic, normocephalic, neck supple, no JVD, pupils PERRLA, no scleral icterus, orotracheally intubated  Lungs: Diminished breath sounds bilaterally, no wheezing or rhonchi noted, synchronous with the ventilator, even Cardiovascular: Irregularly irregular rhythm, rate controlled, atrial fibrillation on the monitor, no murmurs, rubs, gallops Abdomen: Obese, soft, nontender, nondistended, no guarding or rebound tenderness Extremities: Lymphedema bilateral lower extremities, 3-4+ pitting Neuro: Sedated on fentanyl and propofol, follows commands when sedation is lightened, pupils PERRLA  GU: Foley catheter in place draining clear yellow urine Skin: Warm and dry.  Chronic venous stasis ulcers to bilateral lower extremities with weeping, significant lymphedema  Resolved Hospital Problem list   N/A  Assessment & Plan:   Acute on chronic respiratory failure with hypoxia and hypercarbia in the setting of COVID-19 infection & Pulmonary Vascular Congestion Status post Respiratory Arrest Underlying Obesity with Obesity Hypoventilation syndrome -Mechanical ventilation via ARDS protocol, target PRVC 6 cc/kg -Wean PEEP and FiO2 as able to maintain O2 sats >88% -Goal plateau pressure less than 30, driving pressure less than 15 -Paralytics if necessary for vent  synchrony, gas exchange -Cycle prone positioning if necessary for oxygenation -Deep sedation per PAD protocol, goal RASS -4, currently fentanyl, midazolam -Follow intermittent CXR and ABG as needed -Spontaneous breathing trials when respiratory parameters met -Diuresis as blood pressure and renal function can tolerate, goal CVP 5-8.   -VAP prevention order set -Remdesivir, plan for 5 days -IV Steroids (Solumedrol 60 mg BID) -Follow inflammatory markers: Ferritin, D-dimer, CRP -Assess for possible tocilizumab  -Vitamin C, zinc -Obtain Tracheal aspirate~ pending -CTA Chest on 2/20 negative for PE -Maintain airborne and contact precautions   Cardiac arrest (asysole, ROSC obtained in 10 minutes), suspect due to Respiratory Arrest Troponin elevation, likely demand ischemia Acute on Chronic HFrEF (LVEF 30-35%) -Continuous cardiac monitoring -Maintain MAP >65 -Trend Troponin until downtrending (78 ~ 133 ~ 87 ) -Will transition to Argatroban for anticoagulation due to thrombocytopenia -Echocardiogram pending~ LVEF 30-35%, normal LV diastolic parameters -Diuresis as BP and renal function permits ~ will give 40 mg IV Lasix x1 dose today 04/08/20 -Consider Cardiology consult   Enterococcus Faecium (Vancomycin Resistant) UTI -Monitor fever curve -Trend WBC's and procalcitonin -Follow cultures as above, Tracheal aspirate still pending -Will change Rocephin to Zyvox given urine culture and sensitivities   Hyponatremia/hypochloremia due to Volume Overload Mild Hypokalemia (likely due to diuresis) -Monitor I&O's / urinary output -Follow BMP -Ensure adequate renal perfusion -Avoid nephrotoxic agents as able -Replace electrolytes as indicated -Diuresis as Renal function and BAP permits ~ will give 40 mg IV Lasix x1 dose today 2/22 -Pharmacy following for assistance in electrolyte replacement   Anemia Thrombocytopenia -Monitor for S/Sx of bleeding -Trend CBC -Argatroban for  Anticoagulation/VTE Prophylaxis  -Transfuse for Hgb <7 -Transfuse platelets for platelet count <  50K with active bleeding -HIT antibody and Serotonin release assay pending -DIC panel not consistent with DIC -Peripheral blood smear pending ~ Pathologist report: Normal WBC count and differential.  Neutrophils show reduced nuclear lobation, with numerous bilobed pelgeroid forms.  Normocytic anemia with mild anisocytosis and polychromasia.  No increase in schistocytes.  Thrombocytopenia, with rare giant platelets.  The cause of these findings is unclear from morphologic review alone.  Potential causes of cytopenias may include nutritional deficiencies, medication/toxin exposure, chronic inflammatory states, viral illness, and/or autoimmune/rheumatologic disease.   Hyperglycemia -CBG's -SSI -Follow ICU Hypo/Hyperglycemia protocol -Hemoglobin A1c 6.4   Super morbid obesity OSA/OHVS -This issue adds complexity to his management -We will use higher PEEP to avoid alveolar derecruiting -Will make weaning a challenge  Lymphedema -Wound Care Nurse consult, appreciate input, will follow recommendations   Best practice (evaluated daily)  Diet: NPO Pain/Anxiety/Delirium protocol (if indicated): Propofol, Fentanyl gtts VAP protocol (if indicated): Yes, implemented DVT prophylaxis: Argatroban GI prophylaxis: Pepcid Glucose control: SSI Mobility: Bedrest Disposition:ICU  Goals of Care:  Last date of multidisciplinary goals of care discussion:04/08/2020 Family and staff present: Bedside RN.  Attempted to contact pt's only listed contact as [name] Roker (sister), no answer and no voice mail inbox setup. Summary of discussion: Wake up assessment, SBT, diuresis Follow up goals of care discussion due: 04/09/2020 Code Status: Full code  Labs   CBC: Recent Labs  Lab 04/06/20 1111 04/07/20 0350 04/07/20 1805 04/07/20 2248  WBC 8.0 4.7 5.9 5.3  NEUTROABS 7.1  --   --  4.4  HGB 12.4*  11.5* 10.9* 10.8*  HCT 38.9* 35.1* 33.9* 33.2*  MCV 96.8 95.4 95.5 96.5  PLT 90* 72* 77* 77*    Basic Metabolic Panel: Recent Labs  Lab 04/06/20 1111 04/07/20 0350 04/07/20 0947 04/07/20 2248 04/08/20 0600  NA 129* 130*  --  131*  --   K 3.8 4.1  --  3.3*  --   CL 88* 90*  --  92*  --   CO2 24 29  --  28  --   GLUCOSE 170* 168*  --  135*  --   BUN 27* 30*  --  32*  --   CREATININE 1.07 1.12  --  1.13  --   CALCIUM 7.5* 7.3*  --  6.9*  --   MG  --  1.7  --   --  2.0  PHOS  --  <1.0* 2.2*  --  2.1*   GFR: Estimated Creatinine Clearance: 119.7 mL/min (by C-G formula based on SCr of 1.13 mg/dL). Recent Labs  Lab 04/06/20 1111 04/06/20 2320 04/07/20 0350 04/07/20 0947 04/07/20 1805 04/07/20 2248  PROCALCITON 0.23  --   --  0.42  --  0.55  WBC 8.0  --  4.7  --  5.9 5.3  LATICACIDVEN 3.5* 1.4 1.1  --   --   --     Liver Function Tests: Recent Labs  Lab 04/06/20 1111 04/07/20 2248  AST 144* 97*  ALT 48* 36  ALKPHOS 99 65  BILITOT 1.4* 1.6*  PROT 7.6 6.2*  ALBUMIN 3.5 2.5*   No results for input(s): LIPASE, AMYLASE in the last 168 hours. No results for input(s): AMMONIA in the last 168 hours.  ABG    Component Value Date/Time   PHART 7.45 04/06/2020 1900   PCO2ART 43 04/06/2020 1900   PO2ART 117 (H) 04/06/2020 1900   HCO3 29.9 (H) 04/06/2020 1900   ACIDBASEDEF 5.4 (H) 04/06/2020 1120  O2SAT 98.7 04/06/2020 1900     Coagulation Profile: Recent Labs  Lab 04/06/20 1111 04/07/20 2200 04/08/20 0600  INR 1.6* 4.2* 3.6*    Cardiac Enzymes: No results for input(s): CKTOTAL, CKMB, CKMBINDEX, TROPONINI in the last 168 hours.  HbA1C: HB A1C (BAYER DCA - WAIVED)  Date/Time Value Ref Range Status  01/05/2019 04:09 PM 6.0 <7.0 % Final    Comment:                                          Diabetic Adult            <7.0                                       Healthy Adult        4.3 - 5.7                                                            (DCCT/NGSP) American Diabetes Association's Summary of Glycemic Recommendations for Adults with Diabetes: Hemoglobin A1c <7.0%. More stringent glycemic goals (A1c <6.0%) may further reduce complications at the cost of increased risk of hypoglycemia.   10/17/2018 01:28 PM 5.7 <7.0 % Final    Comment:                                          Diabetic Adult            <7.0                                       Healthy Adult        4.3 - 5.7                                                           (DCCT/NGSP) American Diabetes Association's Summary of Glycemic Recommendations for Adults with Diabetes: Hemoglobin A1c <7.0%. More stringent glycemic goals (A1c <6.0%) may further reduce complications at the cost of increased risk of hypoglycemia.    Hgb A1c MFr Bld  Date/Time Value Ref Range Status  01/27/2020 04:54 AM 6.7 (H) 4.8 - 5.6 % Final    Comment:    (NOTE) Pre diabetes:          5.7%-6.4%  Diabetes:              >6.4%  Glycemic control for   <7.0% adults with diabetes   11/08/2019 04:46 AM 6.2 (H) 4.8 - 5.6 % Final    Comment:    (NOTE) Pre diabetes:          5.7%-6.4%  Diabetes:              >6.4%  Glycemic control for   <7.0%  adults with diabetes     CBG: Recent Labs  Lab 04/07/20 1503 04/07/20 2008 04/07/20 2321 04/08/20 0338 04/08/20 0733  GLUCAP 136* 142* 141* 144* 123*    Review of Systems:   Unable to assess due to intubation and sedation  Past Medical History:  He,  has a past medical history of Allergy, Anxiety, Arthritis, Asthma, Brain damage, Chronic pain of both knees (07/13/2018), Clotting disorder ([place]), COPD (chronic obstructive pulmonary disease) ([place]), Depression, GERD (gastroesophageal reflux disease), HFrEF (heart failure with reduced ejection fraction) ([place]), History of DVT (deep vein thrombosis), History of pulmonary embolism, Hypertension, MI (myocardial infarction) ([place]), Morbid obesity ([place]), Neck pain (07/13/2018), NICM (nonischemic  cardiomyopathy) ([place]), Persistent atrial fibrillation ([place]), Sleep apnea, and Sleep apnea.   Surgical History:   Past Surgical History:  Procedure Laterality Date  . CARDIOVERSION N/A 03/24/2018   Procedure: CARDIOVERSION;  Surgeon: Minna Merritts, MD;  Location: [facility] ORS;  Service: Cardiovascular;  Laterality: N/A;  . CARDIOVERSION N/A 08/08/2018   Procedure: CARDIOVERSION;  Surgeon: Nelva Bush, MD;  Location: [facility] ORS;  Service: Cardiovascular;  Laterality: N/A;  . hearnia repair     X 3- total of two surgeries  . HERNIA REPAIR    . LEG SURGERY       Social History:   reports that he has never smoked. He has never used smokeless tobacco. He reports current alcohol use. He reports that he does not use drugs.   Family History:  His family history includes Heart attack in his maternal grandfather and maternal grandmother; Heart failure in his mother; Lung cancer in his father and mother.   Allergies No Known Allergies   Home Medications  Prior to Admission medications   Medication Sig Start Date End Date Taking? Authorizing Provider  DULoxetine (CYMBALTA) 20 MG capsule Take 1 capsule (20 mg total) by mouth daily. 10/25/19  Yes Karamalegos, Devonne Doughty, DO  levothyroxine (SYNTHROID) 75 MCG tablet Take 1 tablet (75 mcg total) by mouth daily before breakfast. 01/13/20  Yes Fritzi Mandes, MD  meclizine (ANTIVERT) 25 MG tablet Take 0.5-1 tablets (12.5-25 mg total) by mouth 3 (three) times daily as needed for dizziness. 02/26/20  Yes Karamalegos, Devonne Doughty, DO  torsemide (DEMADEX) 20 MG tablet Take 4 tablets (80 mg total) by mouth 2 (two) times daily. 02/02/20  Yes Nicole Kindred A, DO  warfarin (COUMADIN) 5 MG tablet Take 5 mg by mouth daily.  09/20/19  Yes [provider]  albuterol (VENTOLIN HFA) 108 (90 Base) MCG/ACT inhaler Inhale 2 puffs into the lungs every 4 (four) hours as needed for wheezing or shortness of breath. Patient not taking: No sig reported 10/25/19    [name], DO  amiodarone (PACERONE) 200 MG tablet Take 1 tablet (200 mg total) by mouth 2 (two) times daily. Patient not taking: Reported on 04/06/2020 01/12/20   Fritzi Mandes, MD  aspirin 81 MG chewable tablet Chew 1 tablet (81 mg total) by mouth daily. 02/03/20   Nicole Kindred A, DO  atorvastatin (LIPITOR) 40 MG tablet TAKE 1 TABLET BY MOUTH ONCE DAILY Patient taking differently: Take 40 mg by mouth daily. 08/15/19   Flinchum, Kelby Aline, FNP  carisoprodol (SOMA) 350 MG tablet Take 1 tablet (350 mg total) by mouth 3 (three) times daily as needed for muscle spasms. Patient not taking: No sig reported 02/02/20   Nicole Kindred A, DO  gabapentin (NEURONTIN) 300 MG capsule Take 300 mg by mouth 3 (three) times daily. 02/15/20   [provider]  lisinopril (ZESTRIL) 2.5 MG tablet Take by mouth. 03/06/19   [provider]  loperamide (IMODIUM) 2 MG capsule Take 1 capsule (2 mg total) by mouth as needed for diarrhea or loose stools. 06/26/19   Lorella Nimrod, MD  magnesium oxide (MAG-OX) 400 (241.3 Mg) MG tablet Take 1 tablet (400 mg total) by mouth 2 (two) times daily. 01/12/20   Fritzi Mandes, MD  metoprolol succinate (TOPROL-XL) 50 MG 24 hr tablet Take 2 tablets (100 mg total) by mouth daily. Take with or immediately following a meal. 01/12/20   Fritzi Mandes, MD  montelukast (SINGULAIR) 10 MG tablet Take 1 tablet (10 mg total) by mouth at bedtime. 10/12/19   Karamalegos, Devonne Doughty, DO  Multiple Vitamin (MULTIVITAMIN WITH MINERALS) TABS tablet Take 1 tablet by mouth daily.    [provider]  naphazoline-glycerin (CLEAR EYES REDNESS) 0.012-0.2 % SOLN Place 1-2 drops into both eyes 4 (four) times daily as needed for eye irritation. 08/14/19   Loletha Grayer, MD  nitroGLYCERIN (NITROSTAT) 0.4 MG SL tablet Place 1 tablet under tongue every 5 minutes as needed for chest pain. (No more than 3 doses within 15 minutes) 03/08/19   Flinchum, Kelby Aline, FNP  potassium  chloride (KLOR-CON) 20 MEQ packet Take 40 mEq by mouth daily. Patient not taking: Reported on 04/06/2020 01/12/20   Fritzi Mandes, MD  SUMAtriptan (IMITREX) 50 MG tablet Take 1 tablet (50 mg total) by mouth every 2 (two) hours as needed for migraine or headache. May repeat in 2 hours if headache persists or recurs. No more than 2 doses in a day 12/28/19   Jennye Boroughs, MD  thiamine 100 MG tablet Take 1 tablet (100 mg total) by mouth daily. Patient not taking: Reported on 04/06/2020 08/14/19   Loletha Grayer, MD  traZODone (DESYREL) 150 MG tablet Take 1 tablet (150 mg total) by mouth at bedtime. 10/25/19   [name], DO     Critical care time: 34 minutes    Darel Hong, [facility] Pager: [phone]

## 2020-04-08 NOTE — Progress Notes (Signed)
ANTICOAGULATION CONSULT NOTE  Pharmacy Consult for argatroban Indication: atrial fibrillation  No Known Allergies  Patient Measurements: Height: 5\' 9"  (175.3 cm) Weight: (!) 194.4 kg (428 lb 9.2 oz) IBW/kg (Calculated) : 70.7 Heparin Dosing Weight: 117 kg  Vital Signs: Temp: 98.24 F (36.8 C) (02/22 0600) Temp Source: Esophageal (02/21 2000) BP: 105/67 (02/22 0600) Pulse Rate: 68 (02/22 0600)  Labs: Recent Labs    04/06/20 1111 04/06/20 1625 04/06/20 2310 04/07/20 0350 04/07/20 0640 04/07/20 1224 04/07/20 1805 04/07/20 2200 04/07/20 2248 04/08/20 0600  HGB 12.4*  --   --  11.5*  --   --  10.9*  --  10.8*  --   HCT 38.9*  --   --  35.1*  --   --  33.9*  --  33.2*  --   PLT 90*  --   --  72*  --   --  77*  --  77*  --   APTT 34  --   --   --   --   --   --  119*  --  91*  LABPROT 18.2*  --   --   --   --   --   --  39.2*  --  34.9*  INR 1.6*  --   --   --   --   --   --  4.2*  --  3.6*  HEPARINUNFRC  --   --  0.17*  --  0.37 0.43  --   --   --   --   CREATININE 1.07  --   --  1.12  --   --   --   --  1.13  --   TROPONINIHS 78* 133*  --   --   --  87*  --   --   --   --     Estimated Creatinine Clearance: 119.7 mL/min (by C-G formula based on SCr of 1.13 mg/dL).   Medical History: Past Medical History:  Diagnosis Date  . Allergy   . Anxiety   . Arthritis   . Asthma   . Brain damage   . Chronic pain of both knees 07/13/2018  . Clotting disorder (HCC)   . COPD (chronic obstructive pulmonary disease) (HCC)   . Depression   . GERD (gastroesophageal reflux disease)   . HFrEF (heart failure with reduced ejection fraction) (HCC)    a. 03/2018 Echo: EF 25-30%, diff HK. Mod LAE.  04/2018 History of DVT (deep vein thrombosis)   . History of pulmonary embolism    a. Chronic coumadin.  [name] Hypertension   . MI (myocardial infarction) (HCC)   . Morbid obesity (HCC)   . Neck pain 07/13/2018  . NICM (nonischemic cardiomyopathy) (HCC)    a. s/p Cath x 3 - reportedly nl cors.  Last cath 2019 in [place]; b. a. 03/2018 Echo: EF 25-30%, diff HK.  04/2018 Persistent atrial fibrillation (HCC)    a. 03/2018 s/p DCCV; b. CHA2DS2VASc = 1-->Xarelto (later changed to warfarin); c. 05/2018 recurrent afib-->Amio initiated.  . Sleep apnea   . Sleep apnea     Assessment: 60 year old male presented with SOB. Patient with h/o atrial fibrillation on warfarin PTA admitted with a subtherapeutic INR. Pharmacy was consulted for heparin for afib. Thrombocytopenia on admission noted and slightly worse with bleeding found at multiple sites. LFTs, bilirubin and INR are all noted to be elevated at baseline, however Child-Pugh Score is 5 (class A)  Goal of  Therapy:  aPTT 50 - 90 seconds Monitor platelets by anticoagulation protocol: Yes    Plan:   aPTT is therapeutic: continue argatroban infusion 0.2 mcg/kg/min  Reheck aPTT in am  Repeat CBC in am  Burnis Medin, PharmD 04/08/2020 7:20 AM

## 2020-04-08 NOTE — Progress Notes (Addendum)
ANTICOAGULATION CONSULT NOTE  Pharmacy Consult for argatroban Indication: atrial fibrillation  No Known Allergies  Patient Measurements: Height: 5\' 9"  (175.3 cm) Weight: (!) 195.5 kg (431 lb) IBW/kg (Calculated) : 70.7 Heparin Dosing Weight: 117 kg  Vital Signs: Temp: 97.52 F (36.4 C) (02/21 2300) Temp Source: Esophageal (02/21 2000) BP: 100/69 (02/21 2300) Pulse Rate: 63 (02/21 2300)  Labs: Recent Labs    04/06/20 1111 04/06/20 1625 04/06/20 2310 04/07/20 0350 04/07/20 0640 04/07/20 1224 04/07/20 1805 04/07/20 2200 04/07/20 2248  HGB 12.4*  --   --  11.5*  --   --  10.9*  --   --   HCT 38.9*  --   --  35.1*  --   --  33.9*  --   --   PLT 90*  --   --  72*  --   --  77*  --   --   APTT 34  --   --   --   --   --   --  119*  --   LABPROT 18.2*  --   --   --   --   --   --  39.2*  --   INR 1.6*  --   --   --   --   --   --  4.2*  --   HEPARINUNFRC  --   --  0.17*  --  0.37 0.43  --   --   --   CREATININE 1.07  --   --  1.12  --   --   --   --  1.13  TROPONINIHS 78* 133*  --   --   --  87*  --   --   --     Estimated Creatinine Clearance: 120.1 mL/min (by C-G formula based on SCr of 1.13 mg/dL).   Medical History: Past Medical History:  Diagnosis Date  . Allergy   . Anxiety   . Arthritis   . Asthma   . Brain damage   . Chronic pain of both knees 07/13/2018  . Clotting disorder (HCC)   . COPD (chronic obstructive pulmonary disease) (HCC)   . Depression   . GERD (gastroesophageal reflux disease)   . HFrEF (heart failure with reduced ejection fraction) (HCC)    a. 03/2018 Echo: EF 25-30%, diff HK. Mod LAE.  04/2018 History of DVT (deep vein thrombosis)   . History of pulmonary embolism    a. Chronic coumadin.  [name] Hypertension   . MI (myocardial infarction) (HCC)   . Morbid obesity (HCC)   . Neck pain 07/13/2018  . NICM (nonischemic cardiomyopathy) (HCC)    a. s/p Cath x 3 - reportedly nl cors. Last cath 2019 in [place]; b. a. 03/2018 Echo: EF 25-30%, diff HK.  04/2018  Persistent atrial fibrillation (HCC)    a. 03/2018 s/p DCCV; b. CHA2DS2VASc = 1-->Xarelto (later changed to warfarin); c. 05/2018 recurrent afib-->Amio initiated.  . Sleep apnea   . Sleep apnea     Assessment: 60 year old male presented with SOB. Patient with h/o atrial fibrillation on warfarin PTA admitted with a subtherapeutic INR. Pharmacy was consulted for heparin for afib. Thrombocytopenia on admission noted and slightly worse with bleeding found at multiple sites. LFTs, bilirubin and INR are all noted to be elevated at baseline, however Child-Pugh Score is 5 (class A)  Goal of Therapy:  aPTT 50 - 90 seconds Monitor platelets by anticoagulation protocol: Yes   02/21 2200 aPTT, supratherapeutic  Plan:   Probation officer.  Hold dose for 1 hours  Retart argatroban at 0.25 mg/kg/hr  Reheck aPTT 4 hours after restarting infusion  Repeat CBC in am  Otelia Sergeant, PharmD, [facility] 04/08/2020 12:15 AM

## 2020-04-08 NOTE — Progress Notes (Addendum)
ANTICOAGULATION CONSULT NOTE  Pharmacy Consult for argatroban Indication: atrial fibrillation  No Known Allergies  Patient Measurements: Height: 5\' 9"  (175.3 cm) Weight: (!) 194.4 kg (428 lb 9.2 oz) IBW/kg (Calculated) : 70.7 Heparin Dosing Weight: 117 kg  Vital Signs: Temp: 98.24 F (36.8 C) (02/22 0300) Temp Source: Esophageal (02/21 2000) BP: 102/66 (02/22 0400) Pulse Rate: 57 (02/22 0400)  Labs: Recent Labs    04/06/20 1111 04/06/20 1625 04/06/20 2310 04/07/20 0350 04/07/20 0640 04/07/20 1224 04/07/20 1805 04/07/20 2200 04/07/20 2248 04/08/20 0600  HGB 12.4*  --   --  11.5*  --   --  10.9*  --  10.8*  --   HCT 38.9*  --   --  35.1*  --   --  33.9*  --  33.2*  --   PLT 90*  --   --  72*  --   --  77*  --  77*  --   APTT 34  --   --   --   --   --   --  119*  --  91*  LABPROT 18.2*  --   --   --   --   --   --  39.2*  --  34.9*  INR 1.6*  --   --   --   --   --   --  4.2*  --  3.6*  HEPARINUNFRC  --   --  0.17*  --  0.37 0.43  --   --   --   --   CREATININE 1.07  --   --  1.12  --   --   --   --  1.13  --   TROPONINIHS 78* 133*  --   --   --  87*  --   --   --   --     Estimated Creatinine Clearance: 119.7 mL/min (by C-G formula based on SCr of 1.13 mg/dL).   Medical History: Past Medical History:  Diagnosis Date  . Allergy   . Anxiety   . Arthritis   . Asthma   . Brain damage   . Chronic pain of both knees 07/13/2018  . Clotting disorder (HCC)   . COPD (chronic obstructive pulmonary disease) (HCC)   . Depression   . GERD (gastroesophageal reflux disease)   . HFrEF (heart failure with reduced ejection fraction) (HCC)    a. 03/2018 Echo: EF 25-30%, diff HK. Mod LAE.  04/2018 History of DVT (deep vein thrombosis)   . History of pulmonary embolism    a. Chronic coumadin.  [name] Hypertension   . MI (myocardial infarction) (HCC)   . Morbid obesity (HCC)   . Neck pain 07/13/2018  . NICM (nonischemic cardiomyopathy) (HCC)    a. s/p Cath x 3 - reportedly nl cors.  Last cath 2019 in [place]; b. a. 03/2018 Echo: EF 25-30%, diff HK.  04/2018 Persistent atrial fibrillation (HCC)    a. 03/2018 s/p DCCV; b. CHA2DS2VASc = 1-->Xarelto (later changed to warfarin); c. 05/2018 recurrent afib-->Amio initiated.  . Sleep apnea   . Sleep apnea     Assessment: 60 year old male presented with SOB. Patient with h/o atrial fibrillation on warfarin PTA admitted with a subtherapeutic INR. Pharmacy was consulted for heparin for afib. Thrombocytopenia on admission noted and slightly worse with bleeding found at multiple sites. LFTs, bilirubin and INR are all noted to be elevated at baseline, however Child-Pugh Score is 5 (class A)  Goal of  Therapy:  aPTT 50 - 90 seconds Monitor platelets by anticoagulation protocol: Yes   02/21 2200 aPTT 119, supratherapeutic 02/22 0600 aPTT 91, supratherapeutic   Plan:   Decrease argatroban to 0.20 mg/kg/hr  Reheck aPTT in 4 hours  Repeat CBC in am  Otelia Sergeant, PharmD, [facility] 04/08/2020 6:38 AM

## 2020-04-09 DIAGNOSIS — U071 COVID-19: Secondary | ICD-10-CM | POA: Diagnosis not present

## 2020-04-09 LAB — CBC WITH DIFFERENTIAL/PLATELET
Abs Immature Granulocytes: 0.44 10*3/uL — ABNORMAL HIGH (ref 0.00–0.07)
Basophils Absolute: 0.1 10*3/uL (ref 0.0–0.1)
Basophils Relative: 1 %
Eosinophils Absolute: 0 10*3/uL (ref 0.0–0.5)
Eosinophils Relative: 0 %
HCT: 39.1 % (ref 39.0–52.0)
Hemoglobin: 13 g/dL (ref 13.0–17.0)
Immature Granulocytes: 6 %
Lymphocytes Relative: 12 %
Lymphs Abs: 0.8 10*3/uL (ref 0.7–4.0)
MCH: 32 pg (ref 26.0–34.0)
MCHC: 33.2 g/dL (ref 30.0–36.0)
MCV: 96.3 fL (ref 80.0–100.0)
Monocytes Absolute: 0.5 10*3/uL (ref 0.1–1.0)
Monocytes Relative: 7 %
Neutro Abs: 5.3 10*3/uL (ref 1.7–7.7)
Neutrophils Relative %: 74 %
Platelets: 105 10*3/uL — ABNORMAL LOW (ref 150–400)
RBC: 4.06 MIL/uL — ABNORMAL LOW (ref 4.22–5.81)
RDW: 17.9 % — ABNORMAL HIGH (ref 11.5–15.5)
Smear Review: NORMAL
WBC: 7.1 10*3/uL (ref 4.0–10.5)
nRBC: 1.1 % — ABNORMAL HIGH (ref 0.0–0.2)

## 2020-04-09 LAB — COMPREHENSIVE METABOLIC PANEL
ALT: 59 U/L — ABNORMAL HIGH (ref 0–44)
AST: 171 U/L — ABNORMAL HIGH (ref 15–41)
Albumin: 2.6 g/dL — ABNORMAL LOW (ref 3.5–5.0)
Alkaline Phosphatase: 94 U/L (ref 38–126)
Anion gap: 11 (ref 5–15)
BUN: 33 mg/dL — ABNORMAL HIGH (ref 6–20)
CO2: 29 mmol/L (ref 22–32)
Calcium: 7.4 mg/dL — ABNORMAL LOW (ref 8.9–10.3)
Chloride: 95 mmol/L — ABNORMAL LOW (ref 98–111)
Creatinine, Ser: 1.19 mg/dL (ref 0.61–1.24)
GFR, Estimated: >60 mL/min (ref >60)
Glucose, Bld: 149 mg/dL — ABNORMAL HIGH (ref 70–99)
Potassium: 4.3 mmol/L (ref 3.5–5.1)
Sodium: 135 mmol/L (ref 135–145)
Total Bilirubin: 2.3 mg/dL — ABNORMAL HIGH (ref 0.3–1.2)
Total Protein: 6.8 g/dL (ref 6.5–8.1)

## 2020-04-09 LAB — MAGNESIUM: Magnesium: 2 mg/dL (ref 1.7–2.4)

## 2020-04-09 LAB — BLOOD GAS, ARTERIAL
Acid-Base Excess: 2.4 mmol/L — ABNORMAL HIGH (ref 0.0–2.0)
Bicarbonate: 27.2 mmol/L (ref 20.0–28.0)
FIO2: 0.36
Mechanical Rate: 20
O2 Saturation: 96.1 %
PEEP: 10 cmH2O
Patient temperature: 37
Pressure control: 14 cmH2O
RATE: 20 {breaths}/min
pCO2 arterial: 42 mmHg (ref 32.0–48.0)
pH, Arterial: 7.42 (ref 7.350–7.450)
pO2, Arterial: 81 mmHg — ABNORMAL LOW (ref 83.0–108.0)

## 2020-04-09 LAB — HEPARIN INDUCED PLATELET AB (HIT ANTIBODY): Heparin Induced Plt Ab: 0.079 {OD_unit} (ref 0.000–0.400)

## 2020-04-09 LAB — GLUCOSE, CAPILLARY
Glucose-Capillary: 161 mg/dL — ABNORMAL HIGH (ref 70–99)
Glucose-Capillary: 141 mg/dL — ABNORMAL HIGH (ref 70–99)
Glucose-Capillary: 177 mg/dL — ABNORMAL HIGH (ref 70–99)
Glucose-Capillary: 152 mg/dL — ABNORMAL HIGH (ref 70–99)
Glucose-Capillary: 135 mg/dL — ABNORMAL HIGH (ref 70–99)
Glucose-Capillary: 143 mg/dL — ABNORMAL HIGH (ref 70–99)

## 2020-04-09 LAB — C-REACTIVE PROTEIN: CRP: 10.6 mg/dL — ABNORMAL HIGH (ref <1.0)

## 2020-04-09 LAB — PROCALCITONIN: Procalcitonin: 0.34 ng/mL

## 2020-04-09 LAB — APTT: aPTT: 62 seconds — ABNORMAL HIGH (ref 24–36)

## 2020-04-09 LAB — PHOSPHORUS: Phosphorus: 1.9 mg/dL — ABNORMAL LOW (ref 2.5–4.6)

## 2020-04-09 LAB — D-DIMER, QUANTITATIVE: D-Dimer, Quant: 1.49 ug{FEU}/mL — ABNORMAL HIGH (ref 0.00–0.50)

## 2020-04-09 MED ORDER — HYDROMORPHONE HCL 1 MG/ML IJ SOLN
INTRAMUSCULAR | Status: AC
  Filled 2020-04-09: qty 1

## 2020-04-09 MED ORDER — POTASSIUM & SODIUM PHOSPHATES 280-160-250 MG PO PACK
2.0000 | PACK | Freq: Three times a day (TID) | ORAL | Status: AC
  Administered 2020-04-09 (×3): 2
  Filled 2020-04-09 (×4): qty 2

## 2020-04-09 MED ORDER — FUROSEMIDE 10 MG/ML IJ SOLN
40.0000 mg | Freq: Once | INTRAMUSCULAR | Status: AC
  Administered 2020-04-09: 40 mg via INTRAVENOUS

## 2020-04-09 MED ORDER — IPRATROPIUM-ALBUTEROL 0.5-2.5 (3) MG/3ML IN SOLN
3.0000 mL | Freq: Four times a day (QID) | RESPIRATORY_TRACT | Status: DC
  Administered 2020-04-09 – 2020-04-12 (×11): 3 mL via RESPIRATORY_TRACT
  Filled 2020-04-09 (×12): qty 3

## 2020-04-09 MED ORDER — IPRATROPIUM-ALBUTEROL 0.5-2.5 (3) MG/3ML IN SOLN
3.0000 mL | Freq: Once | RESPIRATORY_TRACT | Status: AC
  Administered 2020-04-09: 3 mL via RESPIRATORY_TRACT

## 2020-04-09 MED ORDER — VITAL 1.5 CAL PO LIQD
1000.0000 mL | ORAL | Status: DC
  Administered 2020-04-09 – 2020-04-11 (×2): 1000 mL

## 2020-04-09 MED ORDER — FUROSEMIDE 10 MG/ML IJ SOLN
40.0000 mg | Freq: Every day | INTRAMUSCULAR | Status: DC
  Administered 2020-04-10: 40 mg via INTRAVENOUS
  Filled 2020-04-09: qty 4

## 2020-04-09 MED ORDER — ACETAMINOPHEN 325 MG PO TABS
650.0000 mg | ORAL_TABLET | Freq: Four times a day (QID) | ORAL | Status: DC | PRN
  Administered 2020-04-09: 650 mg
  Filled 2020-04-09: qty 2

## 2020-04-09 MED ORDER — FREE WATER
30.0000 mL | Status: DC
  Administered 2020-04-09 – 2020-04-12 (×15): 30 mL

## 2020-04-09 MED ORDER — HYDROMORPHONE HCL 1 MG/ML IJ SOLN
1.0000 mg | Freq: Once | INTRAMUSCULAR | Status: AC
  Administered 2020-04-09: 1 mg via INTRAVENOUS

## 2020-04-09 NOTE — Progress Notes (Signed)
ANTICOAGULATION CONSULT NOTE  Pharmacy Consult for argatroban Indication: atrial fibrillation  No Known Allergies  Patient Measurements: Height: 5\' 9"  (175.3 cm) Weight: (!) 191.4 kg (421 lb 15.4 oz) IBW/kg (Calculated) : 70.7 Heparin Dosing Weight: 117 kg  Vital Signs: Temp: 100.04 F (37.8 C) (02/23 0500) Temp Source: Esophageal (02/23 0500) BP: 122/93 (02/23 0500) Pulse Rate: 66 (02/23 0500)  Labs: Recent Labs    04/06/20 1111 04/06/20 1625 04/06/20 2310 04/07/20 0350 04/07/20 0640 04/07/20 1224 04/07/20 1805 04/07/20 2200 04/07/20 2248 04/08/20 0600 04/08/20 1137 04/09/20 0500  HGB 12.4*  --   --  11.5*  --   --  10.9*  --  10.8*  --   --   --   HCT 38.9*  --   --  35.1*  --   --  33.9*  --  33.2*  --   --   --   PLT 90*  --   --  72*  --   --  77*  --  77*  --   --   --   APTT 34  --   --   --   --   --   --  119*  --  91* 73* 62*  LABPROT 18.2*  --   --   --   --   --   --  39.2*  --  34.9*  --   --   INR 1.6*  --   --   --   --   --   --  4.2*  --  3.6*  --   --   HEPARINUNFRC  --   --  0.17*  --  0.37 0.43  --   --   --   --   --   --   CREATININE 1.07  --   --  1.12  --   --   --   --  1.13  --  1.06 1.19  TROPONINIHS 78* 133*  --   --   --  87*  --   --   --   --   --   --     Estimated Creatinine Clearance: 112.5 mL/min (by C-G formula based on SCr of 1.19 mg/dL).   Medical History: Past Medical History:  Diagnosis Date  . Allergy   . Anxiety   . Arthritis   . Asthma   . Brain damage   . Chronic pain of both knees 07/13/2018  . Clotting disorder (HCC)   . COPD (chronic obstructive pulmonary disease) (HCC)   . Depression   . GERD (gastroesophageal reflux disease)   . HFrEF (heart failure with reduced ejection fraction) (HCC)    a. 03/2018 Echo: EF 25-30%, diff HK. Mod LAE.  04/2018 History of DVT (deep vein thrombosis)   . History of pulmonary embolism    a. Chronic coumadin.  [name] Hypertension   . MI (myocardial infarction) (HCC)   . Morbid obesity  (HCC)   . Neck pain 07/13/2018  . NICM (nonischemic cardiomyopathy) (HCC)    a. s/p Cath x 3 - reportedly nl cors. Last cath 2019 in [place]; b. a. 03/2018 Echo: EF 25-30%, diff HK.  04/2018 Persistent atrial fibrillation (HCC)    a. 03/2018 s/p DCCV; b. CHA2DS2VASc = 1-->Xarelto (later changed to warfarin); c. 05/2018 recurrent afib-->Amio initiated.  . Sleep apnea   . Sleep apnea     Assessment: 60 year old male presented with SOB. Patient with h/o atrial fibrillation  on warfarin PTA admitted with a subtherapeutic INR. Pharmacy was consulted for heparin for afib. Thrombocytopenia on admission noted and slightly worse with bleeding found at multiple sites. LFTs, bilirubin and INR are all noted to be elevated at baseline, however Child-Pugh Score is 5 (class A)  Goal of Therapy:  aPTT 50 - 90 seconds Monitor platelets by anticoagulation protocol: Yes    Plan:   APTT 62 this AM, therapeutic: continue argatroban infusion 0.2 mcg/kg/min  Reheck aPTT in am  Repeat CBC in am  Otelia Sergeant, PharmD, [facility] 04/09/2020 5:41 AM

## 2020-04-09 NOTE — Progress Notes (Addendum)
NAME:  [name] [name], MRN:  [id number], DOB:  Oct 21, 1960, LOS: 3 ADMISSION DATE:  04/06/2020, CONSULTATION DATE:  04/07/2019 REFERRING MD:  Dr. Tamala Julian, CHIEF COMPLAINT:  Shortness of Breath; s/p Cardiac Arrest  Brief History:  67. Y.o. Male admitted with Acute on Chronic Respiratory Failure in the setting of COVID-19 Pneumonia. Initially placed on BiPAP.  Suffered Cardiac arrest (asystole) with ROSC obtained in 10 minutes.  History of Present Illness:  Patient is a 60 year old male with supra morbid obesity, obesity hypoventilation syndrome and chronic hypoxic respiratory failure due to the same who presented to [facility] ED due to increased shortness of breath 4 hours prior to admission. His past medical history is as noted below. Per history he had myalgias and cough. He had been on his CPAP prior to presenting to the emergency room but found no relief from it.  Patient was being evaluated in the emergency room and was placed on BiPAP.  However shortly after being placed on BiPAP he became unresponsive and pulseless.  He was noted to be in asystole and received ACLS protocols for approximately 10 minutes until ROSC was obtained.  He was intubated during the procedure.  He is now intubated and mechanically ventilated.  No further history can be obtained.  In the ED laboratory data has no surface patient is COVID 19 positive.  Past Medical History:  Sleep apnea COPD  Atrial fibrillation Nonischemic cardiomyopathy Myocardial infarction HFrEF Hypertension DVT GERD  Depression Arthritis Chronic pain Morbid obesity   Significant Hospital Events:  2/20: Presented to ED; suffered Respiratory/Cardiac arrest (10 minutes of ACLS before ROSC); intubated 2/21: FiO2 weaned to 45%; Diuresis today 2/22: Failed SBT (pt with increased work of breathing and tachycardia 150's); will Diurese again today; Urine culture with Vancomycin Resistant Enterococcus Faecium~placed on Zyvox 2/23: Plan for SBT,  Precedex started to assist with SBT  Consults:  Wound Care  Procedures:  2/20: Tracheal intubation 2/20: Right IJ CVC placed  Significant Diagnostic Tests:  2/20: CTA Chest>>1. No obvious large central pulmonary artery embolus identified. 2. Probable trace bilateral pleural effusions. Bilateral lower lobe subsegmental and streaky densities may represent atelectasis or scarring. Infiltrate is not excluded. 3. Aortic Atherosclerosis 2/21: Echocardiogram>>1. Left ventricular ejection fraction, by estimation, is 30 to 35%. Left  ventricular ejection fraction by PLAX is 21 %. The left ventricle has  moderate to severely decreased function. Left ventricular endocardial  border not optimally defined to evaluate  regional wall motion. The left ventricular internal cavity size was  mildly dilated. Left ventricular diastolic parameters were normal.  2. Right ventricular systolic function is normal. The right ventricular  size is mildly enlarged.  3. Left atrial size was mildly dilated.  4. Right atrial size was mildly dilated.  5. The mitral valve was not well visualized. No evidence of mitral valve  regurgitation.  6. The aortic valve was not well visualized. Aortic valve regurgitation  is not visualized.  Micro Data:  2/20: SARS-CoV-2 PCR>> positive 2/20: Influenza A/B PCR>>negative 2/20: Blood culture x2>> 2/20: Urine>>20,000 COLONIES/mL ENTEROCOCCUS FAECIUM  VANCOMYCIN RESISTANT ENTEROCOCCUS ISOLATED  2/21: MRSA PCR>> positive 2/21: Tracheal aspirate>>  Antimicrobials:  Remdesivir 2/20>> Ceftriaxone 2/21>>2/22 Linezolid 2/22>>  Interim History / Subjective:  -No acute events noted overnight, noted to have low grade fever 100.9 -Hemodynamically stable, A. Fib (rate controlled),  NO pressors -2.8 L urine output yesterday with diuresis (1.5L net negative now since admission); Creatinine remains stable at 1.19 -Precedex started to assist with SBT today  Objective  Blood pressure 109/85, pulse 78, temperature (!) 100.94 F (38.3 C), resp. rate 20, height _0  (1.753 m), weight (!) 191.4 kg, SpO2 97 %.    Vent Mode: PRVC FiO2 (%):  [35 %-40 %] 35 % Set Rate:  [20 bmp] 20 bmp Vt Set:  [500 mL] 500 mL PEEP:  [5 cmH20] 5 cmH20 Plateau Pressure:  [22 cmH20] 22 cmH20   Intake/Output Summary (Last 24 hours) at 04/09/2020 4782 Last data filed at 04/09/2020 0400 Gross per 24 hour  Intake 1373 ml  Output 2865 ml  Net -1492 ml   Filed Weights   04/07/20 0343 04/08/20 0500 04/09/20 0449  Weight: (!) 195.5 kg (!) 194.4 kg (!) 191.4 kg    Examination: General: Acute on chronically ill appearing male, laying in bed, lightly sedated, in NAD  HENT: Atraumatic, normocephalic, neck supple, no JVD, pupils PERRL, no scleral icterus, orotracheally intubated  Lungs: Diminished breath sounds bilaterally, synchronous with vent, even  Cardiovascular: Irregularly irregular rhythm (a.fib), rate controlled. No M/R/G Abdomen: Obese, soft, nontender, nondistended, no guarding or rebound tenderness, BS+x4 Extremities: Lymphedema bilateral LE, 3-4+ pitting Neuro: Lightly sedated, opens to voice, follows commands, pupils PERRL GU: Foley in place draining yellow urine Skin: Warm and fry.  Chronic venous stasis ulcer to bilateral LE with weeping, significant lymphedema   Resolved Hospital Problem list   N/A  Assessment & Plan:   Acute on chronic respiratory failure with hypoxia and hypercarbia in the setting of COVID-19 infection & Pulmonary Vascular Congestion Status post Respiratory Arrest Underlying Obesity with Obesity Hypoventilation syndrome -Mechanical ventilation via ARDS protocol, target PRVC 6 cc/kg -Wean PEEP and FiO2 as able to maintain O2 sats >88% -Goal plateau pressure less than 30, driving pressure less than 15 -Paralytics if necessary for vent synchrony, gas exchange -Cycle prone positioning if necessary for oxygenation -Deep sedation per PAD  protocol, goal RASS -4, currently fentanyl, midazolam -Follow intermittent CXR and ABG as needed -Spontaneous breathing trials when respiratory parameters met -Diuresis as blood pressure and renal function can tolerate, goal CVP 5-8.   -VAP prevention order set -Remdesivir, plan for 5 days -IV Steroids (Solumedrol 60 mg BID) -Follow inflammatory markers: Ferritin, D-dimer, CRP -Vitamin C, zinc -Obtain Tracheal aspirate~ pending -CTA Chest on 2/20 negative for PE -Maintain airborne and contact precautions   Cardiac arrest (asysole, ROSC obtained in 10 minutes), suspect due to Respiratory Arrest Troponin elevation, likely demand ischemia Acute on Chronic HFrEF (LVEF 30-35%) Atrial Fibrillation -Continuous cardiac monitoring -Maintain MAP >65 -Trend Troponin until downtrending (78 ~ 133 ~ 87 ) -Continue Argatroban for anticoagulation  -Echocardiogram pending~ LVEF 30-35%, normal LV diastolic parameters -Diuresis as BP and renal function permits ~ will give 40 mg IV Lasix x1 dose today 04/09/20 -Consider Cardiology consult   Enterococcus Faecium (Vancomycin Resistant) UTI -Monitor fever curve -Trend WBC's and procalcitonin -Follow cultures as above, Tracheal aspirate still pending -Continue  Zyvox given urine culture and sensitivities   Hyponatremia/hypochloremia due to Volume Overload Mild Hypokalemia (likely due to diuresis)>>resolved -Monitor I&O's / urinary output -Follow BMP -Ensure adequate renal perfusion -Avoid nephrotoxic agents as able -Replace electrolytes as indicated -Diuresis as Renal function and BAP permits ~ will give 40 mg IV Lasix x1 dose today 2/23 -Pharmacy following for assistance in electrolyte replacement   Anemia Thrombocytopenia -Monitor for S/Sx of bleeding -Trend CBC -Argatroban for Anticoagulation/VTE Prophylaxis  -Transfuse for Hgb <7 -Transfuse platelets for platelet count < 50K with active bleeding -HIT antibody and Serotonin release  assay pending -DIC  panel not consistent with DIC -Peripheral blood smear pending ~ Pathologist report: Normal WBC count and differential.  Neutrophils show reduced nuclear lobation, with numerous bilobed pelgeroid forms.  Normocytic anemia with mild anisocytosis and polychromasia.  No increase in schistocytes.  Thrombocytopenia, with rare giant platelets.  The cause of these findings is unclear from morphologic review alone.  Potential causes of cytopenias may include nutritional deficiencies, medication/toxin exposure, chronic inflammatory states, viral illness, and/or autoimmune/rheumatologic disease.   Hyperglycemia -CBG's -SSI -Follow ICU Hypo/Hyperglycemia protocol -Hemoglobin A1c 6.4   Super morbid obesity OSA/OHVS -This issue adds complexity to his management -We will use higher PEEP to avoid alveolar derecruiting -Will make weaning a challenge  Lymphedema -Wound Care Nurse consult, appreciate input, will follow recommendations   Best practice (evaluated daily)  Diet: NPO, tube feeds Pain/Anxiety/Delirium protocol (if indicated): Propofol, Fentanyl gtts VAP protocol (if indicated): Yes, implemented DVT prophylaxis: Argatroban GI prophylaxis: Pepcid Glucose control: SSI Mobility: Bedrest Disposition:ICU  Goals of Care:  Last date of multidisciplinary goals of care discussion:04/09/2020 Family and staff present: Bedside RN.  Updated pt's sister Sundeep Destin via telephone on 04/09/20  Summary of discussion: Wake up assessment, SBT, diuresis Follow up goals of care discussion due: 04/10/2020 Code Status: Full code  Labs   CBC: Recent Labs  Lab 04/06/20 1111 04/07/20 0350 04/07/20 1805 04/07/20 2248 04/09/20 0500  WBC 8.0 4.7 5.9 5.3 7.1  NEUTROABS 7.1  --   --  4.4 5.3  HGB 12.4* 11.5* 10.9* 10.8* 13.0  HCT 38.9* 35.1* 33.9* 33.2* 39.1  MCV 96.8 95.4 95.5 96.5 96.3  PLT 90* 72* 77* 77* 105*    Basic Metabolic Panel: Recent Labs  Lab 04/06/20 1111  04/07/20 0350 04/07/20 0947 04/07/20 2248 04/08/20 0600 04/08/20 1137 04/09/20 0500  NA 129* 130*  --  131*  --  134* 135  K 3.8 4.1  --  3.3*  --  3.9 4.3  CL 88* 90*  --  92*  --  95* 95*  CO2 24 29  --  28  --  27 29  GLUCOSE 170* 168*  --  135*  --  145* 149*  BUN 27* 30*  --  32*  --  31* 33*  CREATININE 1.07 1.12  --  1.13  --  1.06 1.19  CALCIUM 7.5* 7.3*  --  6.9*  --  7.1* 7.4*  MG  --  1.7  --   --  2.0  --  2.0  PHOS  --  <1.0* 2.2*  --  2.1*  --  1.9*   GFR: Estimated Creatinine Clearance: 112.5 mL/min (by C-G formula based on SCr of 1.19 mg/dL). Recent Labs  Lab 04/06/20 1111 04/06/20 2320 04/07/20 0350 04/07/20 0947 04/07/20 1805 04/07/20 2248 04/09/20 0500  PROCALCITON 0.23  --   --  0.42  --  0.55 0.34  WBC 8.0  --  4.7  --  5.9 5.3 7.1  LATICACIDVEN 3.5* 1.4 1.1  --   --   --   --     Liver Function Tests: Recent Labs  Lab 04/06/20 1111 04/07/20 2248 04/09/20 0500  AST 144* 97* 171*  ALT 48* 36 59*  ALKPHOS 99 65 94  BILITOT 1.4* 1.6* 2.3*  PROT 7.6 6.2* 6.8  ALBUMIN 3.5 2.5* 2.6*   No results for input(s): LIPASE, AMYLASE in the last 168 hours. No results for input(s): AMMONIA in the last 168 hours.  ABG    Component Value Date/Time   PHART  7.45 04/06/2020 1900   PCO2ART 43 04/06/2020 1900   PO2ART 117 (H) 04/06/2020 1900   HCO3 29.9 (H) 04/06/2020 1900   ACIDBASEDEF 5.4 (H) 04/06/2020 1120   O2SAT 98.7 04/06/2020 1900     Coagulation Profile: Recent Labs  Lab 04/06/20 1111 04/07/20 2200 04/08/20 0600  INR 1.6* 4.2* 3.6*    Cardiac Enzymes: No results for input(s): CKTOTAL, CKMB, CKMBINDEX, TROPONINI in the last 168 hours.  HbA1C: HB A1C (BAYER DCA - WAIVED)  Date/Time Value Ref Range Status  01/05/2019 04:09 PM 6.0 <7.0 % Final    Comment:                                          Diabetic Adult            <7.0                                       Healthy Adult        4.3 - 5.7                                                            (DCCT/NGSP) American Diabetes Association's Summary of Glycemic Recommendations for Adults with Diabetes: Hemoglobin A1c <7.0%. More stringent glycemic goals (A1c <6.0%) may further reduce complications at the cost of increased risk of hypoglycemia.   10/17/2018 01:28 PM 5.7 <7.0 % Final    Comment:                                          Diabetic Adult            <7.0                                       Healthy Adult        4.3 - 5.7                                                           (DCCT/NGSP) American Diabetes Association's Summary of Glycemic Recommendations for Adults with Diabetes: Hemoglobin A1c <7.0%. More stringent glycemic goals (A1c <6.0%) may further reduce complications at the cost of increased risk of hypoglycemia.    Hgb A1c MFr Bld  Date/Time Value Ref Range Status  04/07/2020 10:48 PM 6.4 (H) 4.8 - 5.6 % Final    Comment:    (NOTE) Pre diabetes:          5.7%-6.4%  Diabetes:              >6.4%  Glycemic control for   <7.0% adults with diabetes   01/27/2020 04:54 AM 6.7 (H) 4.8 - 5.6 % Final    Comment:    (NOTE) Pre diabetes:  5.7%-6.4%  Diabetes:              >6.4%  Glycemic control for   <7.0% adults with diabetes     CBG: Recent Labs  Lab 04/08/20 1539 04/08/20 2008 04/08/20 2334 04/09/20 0418 04/09/20 0738  GLUCAP 120* 129* 155* 161* 141*    Review of Systems:   Unable to assess due to intubation and sedation  Past Medical History:  He,  has a past medical history of Allergy, Anxiety, Arthritis, Asthma, Brain damage, Chronic pain of both knees (07/13/2018), Clotting disorder ([place]), COPD (chronic obstructive pulmonary disease) ([place]), Depression, GERD (gastroesophageal reflux disease), HFrEF (heart failure with reduced ejection fraction) ([place]), History of DVT (deep vein thrombosis), History of pulmonary embolism, Hypertension, MI (myocardial infarction) ([place]), Morbid obesity ([place]), Neck pain (07/13/2018), NICM  (nonischemic cardiomyopathy) ([place]), Persistent atrial fibrillation ([place]), Sleep apnea, and Sleep apnea.   Surgical History:   Past Surgical History:  Procedure Laterality Date  . CARDIOVERSION N/A 03/24/2018   Procedure: CARDIOVERSION;  Surgeon: Minna Merritts, MD;  Location: [facility] ORS;  Service: Cardiovascular;  Laterality: N/A;  . CARDIOVERSION N/A 08/08/2018   Procedure: CARDIOVERSION;  Surgeon: Nelva Bush, MD;  Location: [facility] ORS;  Service: Cardiovascular;  Laterality: N/A;  . hearnia repair     X 3- total of two surgeries  . HERNIA REPAIR    . LEG SURGERY       Social History:   reports that he has never smoked. He has never used smokeless tobacco. He reports current alcohol use. He reports that he does not use drugs.   Family History:  His family history includes Heart attack in his maternal grandfather and maternal grandmother; Heart failure in his mother; Lung cancer in his father and mother.   Allergies No Known Allergies   Home Medications  Prior to Admission medications   Medication Sig Start Date End Date Taking? Authorizing Provider  DULoxetine (CYMBALTA) 20 MG capsule Take 1 capsule (20 mg total) by mouth daily. 10/25/19  Yes Karamalegos, Devonne Doughty, DO  levothyroxine (SYNTHROID) 75 MCG tablet Take 1 tablet (75 mcg total) by mouth daily before breakfast. 01/13/20  Yes Fritzi Mandes, MD  meclizine (ANTIVERT) 25 MG tablet Take 0.5-1 tablets (12.5-25 mg total) by mouth 3 (three) times daily as needed for dizziness. 02/26/20  Yes Karamalegos, Devonne Doughty, DO  torsemide (DEMADEX) 20 MG tablet Take 4 tablets (80 mg total) by mouth 2 (two) times daily. 02/02/20  Yes Nicole Kindred A, DO  warfarin (COUMADIN) 5 MG tablet Take 5 mg by mouth daily.  09/20/19  Yes [provider]  albuterol (VENTOLIN HFA) 108 (90 Base) MCG/ACT inhaler Inhale 2 puffs into the lungs every 4 (four) hours as needed for wheezing or shortness of breath. Patient not taking: No sig reported  10/25/19   [name], DO  amiodarone (PACERONE) 200 MG tablet Take 1 tablet (200 mg total) by mouth 2 (two) times daily. Patient not taking: Reported on 04/06/2020 01/12/20   Fritzi Mandes, MD  aspirin 81 MG chewable tablet Chew 1 tablet (81 mg total) by mouth daily. 02/03/20   Nicole Kindred A, DO  atorvastatin (LIPITOR) 40 MG tablet TAKE 1 TABLET BY MOUTH ONCE DAILY Patient taking differently: Take 40 mg by mouth daily. 08/15/19   Flinchum, Kelby Aline, FNP  carisoprodol (SOMA) 350 MG tablet Take 1 tablet (350 mg total) by mouth 3 (three) times daily as needed for muscle spasms. Patient not taking: No sig reported 02/02/20  Nicole Kindred A, DO  gabapentin (NEURONTIN) 300 MG capsule Take 300 mg by mouth 3 (three) times daily. 02/15/20   [provider]  lisinopril (ZESTRIL) 2.5 MG tablet Take by mouth. 03/06/19   [provider]  loperamide (IMODIUM) 2 MG capsule Take 1 capsule (2 mg total) by mouth as needed for diarrhea or loose stools. 06/26/19   Lorella Nimrod, MD  magnesium oxide (MAG-OX) 400 (241.3 Mg) MG tablet Take 1 tablet (400 mg total) by mouth 2 (two) times daily. 01/12/20   Fritzi Mandes, MD  metoprolol succinate (TOPROL-XL) 50 MG 24 hr tablet Take 2 tablets (100 mg total) by mouth daily. Take with or immediately following a meal. 01/12/20   Fritzi Mandes, MD  montelukast (SINGULAIR) 10 MG tablet Take 1 tablet (10 mg total) by mouth at bedtime. 10/12/19   Karamalegos, Devonne Doughty, DO  Multiple Vitamin (MULTIVITAMIN WITH MINERALS) TABS tablet Take 1 tablet by mouth daily.    [provider]  naphazoline-glycerin (CLEAR EYES REDNESS) 0.012-0.2 % SOLN Place 1-2 drops into both eyes 4 (four) times daily as needed for eye irritation. 08/14/19   Loletha Grayer, MD  nitroGLYCERIN (NITROSTAT) 0.4 MG SL tablet Place 1 tablet under tongue every 5 minutes as needed for chest pain. (No more than 3 doses within 15 minutes) 03/08/19   Flinchum, Kelby Aline, FNP   potassium chloride (KLOR-CON) 20 MEQ packet Take 40 mEq by mouth daily. Patient not taking: Reported on 04/06/2020 01/12/20   Fritzi Mandes, MD  SUMAtriptan (IMITREX) 50 MG tablet Take 1 tablet (50 mg total) by mouth every 2 (two) hours as needed for migraine or headache. May repeat in 2 hours if headache persists or recurs. No more than 2 doses in a day 12/28/19   Jennye Boroughs, MD  thiamine 100 MG tablet Take 1 tablet (100 mg total) by mouth daily. Patient not taking: Reported on 04/06/2020 08/14/19   Loletha Grayer, MD  traZODone (DESYREL) 150 MG tablet Take 1 tablet (150 mg total) by mouth at bedtime. 10/25/19   [name], DO     Critical care time: 32 minutes    Darel Hong, AGACNP-BC [facility] Pager: [phone]   ATTENDING ATTESTATION:  Patient seen and examined face to face fashion Images of the day and lab values reviewed I agree with the statements made by APP: Darel Hong except as otherwise noted here   60 yo hx OHS with COVID-19 pneumonitis, MRSA PNA, post acute hypoxic arrest to asystole with ROSC after 58m LUNGS:  ETT. Distant. CVS:       RRR CNS:       sedated   Complete Zyvox for E. Faecium and MRSA PNA Continue to wean vent as able  Offset obesity with PEEP beyond FiO2 table  //BRESCIA

## 2020-04-09 NOTE — Progress Notes (Signed)
PHARMACY CONSULT NOTE  Pharmacy Consult for Electrolyte Monitoring and Replacement   Recent Labs: Potassium (mmol/L)  Date Value  04/09/2020 4.3   Magnesium (mg/dL)  Date Value  48/25/0037 2.0   Calcium (mg/dL)  Date Value  04/88/8916 7.4 (L)   Albumin (g/dL)  Date Value  94/50/3888 2.6 (L)  05/02/2019 3.8   Phosphorus (mg/dL)  Date Value  28/00/3491 1.9 (L)   Sodium (mmol/L)  Date Value  04/09/2020 135  06/06/2019 139   Corrected Ca: 8.5 mg/dL  Assessment: 60 y/o male with h/o HTN, MI, COPD, GERD, OSA, morbid obesity, Afib, CHF, hernia repair and lymphedema who is admitted with UTI and COVID 19 currently sedated and ventilated. OGT in place to LIS  Diuretics: IV furosemide 40 mg once daily  Goal of Therapy:  Electrolytes WNL  Plan:   500 mg Phos-Nak per tube x 3 (each dose contains 500 mg elemental phosphorous)  Re-check electrolytes in am  Lowella Bandy ,PharmD Clinical Pharmacist 04/09/2020 7:26 AM

## 2020-04-09 NOTE — Progress Notes (Signed)
Nutrition Follow Up Note   DOCUMENTATION CODES:   Morbid obesity  INTERVENTION:   If patient unable to be extubated, recommend:  Vital 1.5 @60ml /hr- Initiate at 38m/hr and increase by 158mhr q 12 hours until goal rate is reached  Pro-Source 9044mID via tube, provides 40kcal and 11g of protein per serving   Propofol: 11.6 ml/hr- provides 306kcal/day   Free water flushes 31m58m hours to maintain tube patency   Regimen provides 2480kcal/day, 185g/day protein and 1280ml28m free water (with propofol provides 2786kcal/day)  Pt at high refeed risk; recommend monitor potassium, magnesium and phosphorus labs daily until stable  Juven Fruit Punch BID, each serving provides 95kcal and 2.5g of protein (amino acids glutamine and arginine)  NUTRITION DIAGNOSIS:   Inadequate oral intake related to inability to eat (pt sedated and ventilated) as evidenced by NPO status.  GOAL:   Provide needs based on ASPEN/SCCM guidelines  -not met   MONITOR:   Vent status,Labs,Weight trends,Skin,I & O's  ASSESSMENT:   59 y/51male with h/o HTN, MI, COPD, GERD, OSA, morbid obesity, Afib, CHF, hernia repair and lymphedema who is admitted with UTI and COVID 19   Pt sedated and ventilated. OGT in place to LIS. Plan is to try and extubate today. If unable to extubate, plan is to start tube feeds.   Per chart, pt appears weight stable since admit  Medications reviewed and include: colace, lasix, hydromorphone, insulin, solu-medrol, KPhos, pepcid, fentanyl, thiamine   Labs reviewed: K 4.3 wnl, BUN 33(H), P 1.9(L), Mg 2.0 wnl cbgs- 161, 141 x 24 hrs  Patient is currently intubated on ventilator support MV: 10.9 L/min Temp (24hrs), Avg:99.8 F (37.7 C), Min:98.42 F (36.9 C), Max:100.94 F (38.3 C)  MAP- >65mmH62mOP- 2875ml  35mt Order:   Diet Order            Diet NPO time specified  Diet effective now                EDUCATION NEEDS:   No education needs have been identified at  this time  Skin:  Skin Assessment: Reviewed RN Assessment (Stage III R buttocks)  Last BM:  2/23- TYPE 6  Height:   Ht Readings from Last 1 Encounters:  04/06/20 5' 9"  (1.753 m)    Weight:   Wt Readings from Last 1 Encounters:  04/09/20 (!) 191.4 kg    Ideal Body Weight:  72.7 kg  BMI:  Body mass index is 62.31 kg/m.  Estimated Nutritional Needs:   Kcal:  2436kcal/day  Protein:  182g/day  Fluid:  2.2-2.5L/day  Margurette Brener CKoleen Distance, LDN Please refer to [facility] and/or RD on-call/weekend/after hours pager

## 2020-04-10 ENCOUNTER — Inpatient Hospital Stay: Payer: Medicaid Other

## 2020-04-10 ENCOUNTER — Ambulatory Visit: Payer: Medicaid Other | Admitting: Occupational Therapy

## 2020-04-10 DIAGNOSIS — J9602 Acute respiratory failure with hypercapnia: Secondary | ICD-10-CM | POA: Diagnosis not present

## 2020-04-10 DIAGNOSIS — R092 Respiratory arrest: Secondary | ICD-10-CM | POA: Diagnosis not present

## 2020-04-10 DIAGNOSIS — J9601 Acute respiratory failure with hypoxia: Secondary | ICD-10-CM | POA: Diagnosis not present

## 2020-04-10 DIAGNOSIS — U071 COVID-19: Secondary | ICD-10-CM | POA: Diagnosis not present

## 2020-04-10 LAB — COMPREHENSIVE METABOLIC PANEL
ALT: 67 U/L — ABNORMAL HIGH (ref 0–44)
AST: 167 U/L — ABNORMAL HIGH (ref 15–41)
Albumin: 2.5 g/dL — ABNORMAL LOW (ref 3.5–5.0)
Alkaline Phosphatase: 84 U/L (ref 38–126)
Anion gap: 12 (ref 5–15)
BUN: 34 mg/dL — ABNORMAL HIGH (ref 6–20)
CO2: 30 mmol/L (ref 22–32)
Calcium: 7.4 mg/dL — ABNORMAL LOW (ref 8.9–10.3)
Chloride: 95 mmol/L — ABNORMAL LOW (ref 98–111)
Creatinine, Ser: 1.06 mg/dL (ref 0.61–1.24)
GFR, Estimated: >60 mL/min (ref >60)
Glucose, Bld: 148 mg/dL — ABNORMAL HIGH (ref 70–99)
Potassium: 4.4 mmol/L (ref 3.5–5.1)
Sodium: 137 mmol/L (ref 135–145)
Total Bilirubin: 1.9 mg/dL — ABNORMAL HIGH (ref 0.3–1.2)
Total Protein: 6.5 g/dL (ref 6.5–8.1)

## 2020-04-10 LAB — GLUCOSE, CAPILLARY
Glucose-Capillary: 146 mg/dL — ABNORMAL HIGH (ref 70–99)
Glucose-Capillary: 154 mg/dL — ABNORMAL HIGH (ref 70–99)
Glucose-Capillary: 163 mg/dL — ABNORMAL HIGH (ref 70–99)
Glucose-Capillary: 155 mg/dL — ABNORMAL HIGH (ref 70–99)
Glucose-Capillary: 153 mg/dL — ABNORMAL HIGH (ref 70–99)
Glucose-Capillary: 172 mg/dL — ABNORMAL HIGH (ref 70–99)

## 2020-04-10 LAB — CULTURE, RESPIRATORY W GRAM STAIN

## 2020-04-10 LAB — MAGNESIUM: Magnesium: 1.9 mg/dL (ref 1.7–2.4)

## 2020-04-10 LAB — CBC WITH DIFFERENTIAL/PLATELET
Abs Immature Granulocytes: 0.77 10*3/uL — ABNORMAL HIGH (ref 0.00–0.07)
Basophils Absolute: 0 10*3/uL (ref 0.0–0.1)
Basophils Relative: 0 %
Eosinophils Absolute: 0 10*3/uL (ref 0.0–0.5)
Eosinophils Relative: 0 %
HCT: 39.5 % (ref 39.0–52.0)
Hemoglobin: 12.3 g/dL — ABNORMAL LOW (ref 13.0–17.0)
Immature Granulocytes: 9 %
Lymphocytes Relative: 10 %
Lymphs Abs: 0.9 10*3/uL (ref 0.7–4.0)
MCH: 30.4 pg (ref 26.0–34.0)
MCHC: 31.1 g/dL (ref 30.0–36.0)
MCV: 97.8 fL (ref 80.0–100.0)
Monocytes Absolute: 1 10*3/uL (ref 0.1–1.0)
Monocytes Relative: 11 %
Neutro Abs: 6.3 10*3/uL (ref 1.7–7.7)
Neutrophils Relative %: 70 %
Platelets: 108 10*3/uL — ABNORMAL LOW (ref 150–400)
RBC: 4.04 MIL/uL — ABNORMAL LOW (ref 4.22–5.81)
RDW: 17.9 % — ABNORMAL HIGH (ref 11.5–15.5)
Smear Review: NORMAL
WBC: 9 10*3/uL (ref 4.0–10.5)
nRBC: 1.6 % — ABNORMAL HIGH (ref 0.0–0.2)

## 2020-04-10 LAB — C-REACTIVE PROTEIN: CRP: 7.1 mg/dL — ABNORMAL HIGH (ref <1.0)

## 2020-04-10 LAB — SEROTONIN RELEASE ASSAY (SRA)
SRA .2 IU/mL UFH Ser-aCnc: <1 % (ref 0–20)
SRA 100IU/mL UFH Ser-aCnc: 1 % (ref 0–20)

## 2020-04-10 LAB — TRIGLYCERIDES: Triglycerides: 101 mg/dL (ref <150)

## 2020-04-10 LAB — APTT: aPTT: 56 seconds — ABNORMAL HIGH (ref 24–36)

## 2020-04-10 LAB — PHOSPHORUS: Phosphorus: 3.5 mg/dL (ref 2.5–4.6)

## 2020-04-10 LAB — D-DIMER, QUANTITATIVE: D-Dimer, Quant: 0.89 ug/mL-FEU — ABNORMAL HIGH (ref 0.00–0.50)

## 2020-04-10 MED ORDER — ALBUMIN HUMAN 25 % IV SOLN
25.0000 g | Freq: Once | INTRAVENOUS | Status: AC
  Administered 2020-04-10: 25 g via INTRAVENOUS
  Filled 2020-04-10: qty 100

## 2020-04-10 MED ORDER — SODIUM CHLORIDE 0.9 % IV SOLN
100.0000 mg | Freq: Two times a day (BID) | INTRAVENOUS | Status: DC
  Filled 2020-04-10: qty 100

## 2020-04-10 MED ORDER — PROSOURCE TF PO LIQD
90.0000 mL | Freq: Four times a day (QID) | ORAL | Status: DC
  Administered 2020-04-10 – 2020-04-11 (×4): 90 mL
  Filled 2020-04-10: qty 90

## 2020-04-10 MED ORDER — LINEZOLID 600 MG/300ML IV SOLN
600.0000 mg | Freq: Two times a day (BID) | INTRAVENOUS | Status: DC
  Administered 2020-04-10 – 2020-04-14 (×8): 600 mg via INTRAVENOUS
  Filled 2020-04-10 (×9): qty 300

## 2020-04-10 MED ORDER — JUVEN PO PACK
1.0000 | PACK | Freq: Two times a day (BID) | ORAL | Status: DC
  Administered 2020-04-11: 1 via ORAL

## 2020-04-10 MED ORDER — DEXMEDETOMIDINE HCL IN NACL 400 MCG/100ML IV SOLN
0.4000 ug/kg/h | INTRAVENOUS | Status: DC
  Administered 2020-04-10: 0.5 ug/kg/h via INTRAVENOUS
  Administered 2020-04-10 – 2020-04-11 (×2): 0.7 ug/kg/h via INTRAVENOUS
  Administered 2020-04-11: 0.5 ug/kg/h via INTRAVENOUS
  Administered 2020-04-11: 0.6 ug/kg/h via INTRAVENOUS
  Administered 2020-04-11: 0.5 ug/kg/h via INTRAVENOUS
  Administered 2020-04-12: 0.4 ug/kg/h via INTRAVENOUS
  Administered 2020-04-12: 0.6 ug/kg/h via INTRAVENOUS
  Administered 2020-04-12 (×2): 0.5 ug/kg/h via INTRAVENOUS
  Filled 2020-04-10 (×10): qty 100

## 2020-04-10 MED ORDER — FUROSEMIDE 10 MG/ML IJ SOLN
40.0000 mg | Freq: Two times a day (BID) | INTRAMUSCULAR | Status: DC
  Administered 2020-04-10 – 2020-04-11 (×2): 40 mg via INTRAVENOUS
  Filled 2020-04-10 (×2): qty 4

## 2020-04-10 MED FILL — Medication: Qty: 1 | Status: AC

## 2020-04-10 NOTE — Progress Notes (Signed)
Air leak noted, along with sizable volume loss on vent. No distress apparent & O2 sat currently 93. Could not find any source of leak in circuit.  Advanced ETT to 25 cm, which resolved leak. Am CXR pending.

## 2020-04-10 NOTE — Progress Notes (Addendum)
NAME:  [name] [name], MRN:  [id number], DOB:  May 16, 1960, LOS: 4 ADMISSION DATE:  04/06/2020, CONSULTATION DATE:  04/07/2019 REFERRING MD:  Dr. Tamala Julian, CHIEF COMPLAINT:  Shortness of Breath; s/p Cardiac Arrest  Brief History:  60. Y.o. Male admitted with Acute on Chronic Respiratory Failure in the setting of COVID-19 Pneumonia and superimposed MRSA Pneumonia. Initially placed on BiPAP.  Suffered Cardiac arrest (asystole) with ROSC obtained in 10 minutes.  History of Present Illness:  Patient is a 60 year old male with supra morbid obesity, obesity hypoventilation syndrome and chronic hypoxic respiratory failure due to the same who presented to [facility] ED due to increased shortness of breath 4 hours prior to admission. His past medical history is as noted below. Per history he had myalgias and cough. He had been on his CPAP prior to presenting to the emergency room but found no relief from it.  Patient was being evaluated in the emergency room and was placed on BiPAP.  However shortly after being placed on BiPAP he became unresponsive and pulseless.  He was noted to be in asystole and received ACLS protocols for approximately 10 minutes until ROSC was obtained.  He was intubated during the procedure.  He is now intubated and mechanically ventilated.  No further history can be obtained.  In the ED laboratory data has no surface patient is COVID 19 positive.  Past Medical History:  Sleep apnea COPD  Atrial fibrillation Nonischemic cardiomyopathy Myocardial infarction HFrEF Hypertension DVT GERD  Depression Arthritis Chronic pain Morbid obesity   Significant Hospital Events:  2/20: Presented to ED; suffered Respiratory/Cardiac arrest (10 minutes of ACLS before ROSC); intubated 2/21: FiO2 weaned to 45%; Diuresis today 2/22: Failed SBT (pt with increased work of breathing and tachycardia 150's); will Diurese again today; Urine culture with Vancomycin Resistant Enterococcus Faecium~placed on  Zyvox 2/23: Plan for SBT, Precedex started to assist with SBT 2/24: Failed SBT; increasing Diuresis to BID; Tracheal aspirate from admission with MRSA PNA  Consults:  Wound Care  Procedures:  2/20: Tracheal intubation 2/20: Right IJ CVC placed  Significant Diagnostic Tests:  2/20: CTA Chest>>1. No obvious large central pulmonary artery embolus identified. 2. Probable trace bilateral pleural effusions. Bilateral lower lobe subsegmental and streaky densities may represent atelectasis or scarring. Infiltrate is not excluded. 3. Aortic Atherosclerosis 2/21: Echocardiogram>>1. Left ventricular ejection fraction, by estimation, is 30 to 35%. Left  ventricular ejection fraction by PLAX is 21 %. The left ventricle has  moderate to severely decreased function. Left ventricular endocardial  border not optimally defined to evaluate  regional wall motion. The left ventricular internal cavity size was  mildly dilated. Left ventricular diastolic parameters were normal.  2. Right ventricular systolic function is normal. The right ventricular  size is mildly enlarged.  3. Left atrial size was mildly dilated.  4. Right atrial size was mildly dilated.  5. The mitral valve was not well visualized. No evidence of mitral valve  regurgitation.  6. The aortic valve was not well visualized. Aortic valve regurgitation  is not visualized.  Micro Data:  2/20: SARS-CoV-2 PCR>> positive 2/20: Influenza A/B PCR>>negative 2/20: Blood culture x2>> 2/20: Urine>>20,000 COLONIES/mL ENTEROCOCCUS FAECIUM  VANCOMYCIN RESISTANT ENTEROCOCCUS ISOLATED  2/21: MRSA PCR>> positive 2/21: Tracheal aspirate>> MRSA  Antimicrobials:  Remdesivir 2/20>>2/24 Ceftriaxone 2/21>>2/22 Linezolid 2/22>>  Interim History / Subjective:  -No acute events overnight -CXR this morning with diffuse bilateral infiltrates/edema -Will increase Lasix to BID and give Albumin -Tracheal Aspirate from 2/21 with MRSA -Afebrile,  hemodynamically stable, NO  pressors  Objective   Blood pressure (!) 130/94, pulse (!) 49, temperature 98.96 F (37.2 C), resp. rate (!) 23, height _0  (1.753 m), weight (!) 193.8 kg, SpO2 97 %.    Vent Mode: PCV FiO2 (%):  [36 %] 36 % Set Rate:  [20 bmp] 20 bmp PEEP:  [5 cmH20-10 cmH20] 5 cmH20 Pressure Support:  [5 cmH20] 5 cmH20   Intake/Output Summary (Last 24 hours) at 04/10/2020 1410 Last data filed at 04/10/2020 1222 Gross per 24 hour  Intake 4342.47 ml  Output 3075 ml  Net 1267.47 ml   Filed Weights   04/08/20 0500 04/09/20 0449 04/10/20 0500  Weight: (!) 194.4 kg (!) 191.4 kg (!) 193.8 kg    Examination: General: Acute on chronically ill appearing male, laying in bed, intubated, lightly sedated, in NAD HENT: Atraumatic, normocephalic, neck supple, orotracheally intubated, MM moist and pink Lungs: Diminished breath sounds bilaterally, synchronous with vent, even Cardiovascular: Irregularly irregular rhythm (a.fib), rate controlled, no M/R/G Abdomen: Obese, soft, nontender, nondistended, no guarding or rebound tenderness, BS+x4 Extremities: Bilateral Lymphedema, 3-4+ pitting  Neuro: Lightly sedated, arouses to voice, follows simple commands and nods to questions, pupils PERRL GU: Foley catheter in place draining yellow urine Skin: Warm and dry.  Chronic venous stasis ulcers to bilateral LE with weeping   Resolved Hospital Problem list   N/A  Assessment & Plan:   Acute on chronic respiratory failure with hypoxia and hypercarbia in the setting of COVID-19 infection, superimposed MRSA Pneumonia, & Pulmonary Vascular Congestion Status post Respiratory Arrest Underlying Obesity with Obesity Hypoventilation syndrome -Mechanical ventilation via ARDS protocol, target PRVC 6 cc/kg -Wean PEEP and FiO2 as able to maintain O2 sats >88% -Goal plateau pressure less than 30, driving pressure less than 15 -Paralytics if necessary for vent synchrony, gas exchange -Cycle prone  positioning if necessary for oxygenation -Deep sedation per PAD protocol, goal RASS -4, currently fentanyl, midazolam -Follow intermittent CXR and ABG as needed -Spontaneous breathing trials when respiratory parameters met -Diuresis as blood pressure and renal function can tolerate, goal CVP 5-8.   -VAP prevention order set -Remdesivir, plan for 5 days (to complete course 04/10/20) -IV Steroids (Solumedrol 60 mg BID) -Follow inflammatory markers: Ferritin, D-dimer, CRP -Vitamin C, zinc -Tracheal aspirate form 2/21 with MRSA -CTA Chest on 2/20 negative for PE -Maintain airborne and contact precautions   Cardiac arrest (asysole, ROSC obtained in 10 minutes), suspect due to Respiratory Arrest Troponin elevation, likely demand ischemia Acute on Chronic HFrEF (LVEF 30-35%) Atrial Fibrillation -Continuous cardiac monitoring -Maintain MAP >65 -Trend Troponin until downtrending (78 ~ 133 ~ 87 ) -Continue Argatroban for anticoagulation  -Echocardiogram pending~ LVEF 30-35%, normal LV diastolic parameters -Diuresis as BP and renal function permits ~ increase Lasix to 40 mg BID 04/10/20   Enterococcus Faecium (Vancomycin Resistant) UTI Superimposed MRSA Pneumonia (pt admitted on 2/20, tracheal aspirate obtained 2/21 with MRSA, DOES NOT meet criteria for HAP/VAP). -Monitor fever curve -Trend WBC's and procalcitonin -Follow cultures as above -Continue  Zyvox    Hyponatremia/hypochloremia due to Volume Overload Mild Hypokalemia (likely due to diuresis)>>resolved -Monitor I&O's / urinary output -Follow BMP -Ensure adequate renal perfusion -Avoid nephrotoxic agents as able -Replace electrolytes as indicated -Diuresis as Renal function and BAP permits ~ increasing Lasix to 40 mg BID 04/10/20 -Pharmacy following for assistance in electrolyte replacement   Anemia Thrombocytopenia>>improving -Monitor for S/Sx of bleeding -Trend CBC -Argatroban for Anticoagulation/VTE Prophylaxis   -Transfuse for Hgb <7 -Transfuse platelets for platelet count < 50K with  active bleeding -HIT antibody and Serotonin release assay both negative -DIC panel not consistent with DIC -Peripheral blood smear pending ~ Pathologist report: Normal WBC count and differential.  Neutrophils show reduced nuclear lobation, with numerous bilobed pelgeroid forms.  Normocytic anemia with mild anisocytosis and polychromasia.  No increase in schistocytes.  Thrombocytopenia, with rare giant platelets.  The cause of these findings is unclear from morphologic review alone.  Potential causes of cytopenias may include nutritional deficiencies, medication/toxin exposure, chronic inflammatory states, viral illness, and/or autoimmune/rheumatologic disease.   Hyperglycemia -CBG's -SSI -Follow ICU Hypo/Hyperglycemia protocol -Hemoglobin A1c 6.4   Super morbid obesity OSA/OHVS -This issue adds complexity to his management -We will use higher PEEP to avoid alveolar derecruiting -Will make weaning a challenge  Lymphedema -Wound Care Nurse consult, appreciate input, will follow recommendations   Best practice (evaluated daily)  Diet: NPO, tube feeds Pain/Anxiety/Delirium protocol (if indicated): Propofol, Fentanyl gtts VAP protocol (if indicated): Yes, implemented DVT prophylaxis: Argatroban GI prophylaxis: Pepcid Glucose control: SSI Mobility: Bedrest Disposition:ICU  Goals of Care:  Last date of multidisciplinary goals of care discussion:04/10/2020 Family and staff present: Bedside RN.  Updated pt's sister Coltan Spinello via telephone on 04/10/20  Summary of discussion: Wake up assessment, SBT, diuresis Follow up goals of care discussion due: 04/11/2020 Code Status: Full code  Labs   CBC: Recent Labs  Lab 04/06/20 1111 04/07/20 0350 04/07/20 1805 04/07/20 2248 04/09/20 0500 04/10/20 0600  WBC 8.0 4.7 5.9 5.3 7.1 9.0  NEUTROABS 7.1  --   --  4.4 5.3 6.3  HGB 12.4* 11.5* 10.9* 10.8* 13.0  12.3*  HCT 38.9* 35.1* 33.9* 33.2* 39.1 39.5  MCV 96.8 95.4 95.5 96.5 96.3 97.8  PLT 90* 72* 77* 77* 105* 108*    Basic Metabolic Panel: Recent Labs  Lab 04/07/20 0350 04/07/20 0947 04/07/20 2248 04/08/20 0600 04/08/20 1137 04/09/20 0500 04/10/20 0600  NA 130*  --  131*  --  134* 135 137  K 4.1  --  3.3*  --  3.9 4.3 4.4  CL 90*  --  92*  --  95* 95* 95*  CO2 29  --  28  --  _0 GLUCOSE 168*  --  135*  --  145* 149* 148*  BUN 30*  --  32*  --  31* 33* 34*  CREATININE 1.12  --  1.13  --  1.06 1.19 1.06  CALCIUM 7.3*  --  6.9*  --  7.1* 7.4* 7.4*  MG 1.7  --   --  2.0  --  2.0 1.9  PHOS <1.0* 2.2*  --  2.1*  --  1.9* 3.5   GFR: Estimated Creatinine Clearance: 127.3 mL/min (by C-G formula based on SCr of 1.06 mg/dL). Recent Labs  Lab 04/06/20 1111 04/06/20 2320 04/07/20 0350 04/07/20 0947 04/07/20 1805 04/07/20 2248 04/09/20 0500 04/10/20 0600  PROCALCITON 0.23  --   --  0.42  --  0.55 0.34  --   WBC 8.0  --  4.7  --  5.9 5.3 7.1 9.0  LATICACIDVEN 3.5* 1.4 1.1  --   --   --   --   --     Liver Function Tests: Recent Labs  Lab 04/06/20 1111 04/07/20 2248 04/09/20 0500 04/10/20 0600  AST 144* 97* 171* 167*  ALT 48* 36 59* 67*  ALKPHOS 99 65 94 84  BILITOT 1.4* 1.6* 2.3* 1.9*  PROT 7.6 6.2* 6.8 6.5  ALBUMIN 3.5 2.5* 2.6* 2.5*  No results for input(s): LIPASE, AMYLASE in the last 168 hours. No results for input(s): AMMONIA in the last 168 hours.  ABG    Component Value Date/Time   PHART 7.42 04/09/2020 1648   PCO2ART 42 04/09/2020 1648   PO2ART 81 (L) 04/09/2020 1648   HCO3 27.2 04/09/2020 1648   ACIDBASEDEF 5.4 (H) 04/06/2020 1120   O2SAT 96.1 04/09/2020 1648     Coagulation Profile: Recent Labs  Lab 04/06/20 1111 04/07/20 2200 04/08/20 0600  INR 1.6* 4.2* 3.6*    Cardiac Enzymes: No results for input(s): CKTOTAL, CKMB, CKMBINDEX, TROPONINI in the last 168 hours.  HbA1C: HB A1C (BAYER DCA - WAIVED)  Date/Time Value Ref Range  Status  01/05/2019 04:09 PM 6.0 <7.0 % Final    Comment:                                          Diabetic Adult            <7.0                                       Healthy Adult        4.3 - 5.7                                                           (DCCT/NGSP) American Diabetes Association's Summary of Glycemic Recommendations for Adults with Diabetes: Hemoglobin A1c <7.0%. More stringent glycemic goals (A1c <6.0%) may further reduce complications at the cost of increased risk of hypoglycemia.   10/17/2018 01:28 PM 5.7 <7.0 % Final    Comment:                                          Diabetic Adult            <7.0                                       Healthy Adult        4.3 - 5.7                                                           (DCCT/NGSP) American Diabetes Association's Summary of Glycemic Recommendations for Adults with Diabetes: Hemoglobin A1c <7.0%. More stringent glycemic goals (A1c <6.0%) may further reduce complications at the cost of increased risk of hypoglycemia.    Hgb A1c MFr Bld  Date/Time Value Ref Range Status  04/07/2020 10:48 PM 6.4 (H) 4.8 - 5.6 % Final    Comment:    (NOTE) Pre diabetes:          5.7%-6.4%  Diabetes:              >6.4%  Glycemic  control for   <7.0% adults with diabetes   01/27/2020 04:54 AM 6.7 (H) 4.8 - 5.6 % Final    Comment:    (NOTE) Pre diabetes:          5.7%-6.4%  Diabetes:              >6.4%  Glycemic control for   <7.0% adults with diabetes     CBG: Recent Labs  Lab 04/09/20 1937 04/09/20 2328 04/10/20 0308 04/10/20 0744 04/10/20 1140  GLUCAP 135* 143* 146* 154* 163*    Review of Systems:   Unable to assess due to intubation and sedation  Past Medical History:  He,  has a past medical history of Allergy, Anxiety, Arthritis, Asthma, Brain damage, Chronic pain of both knees (07/13/2018), Clotting disorder ([place]), COPD (chronic obstructive pulmonary disease) ([place]), Depression, GERD (gastroesophageal  reflux disease), HFrEF (heart failure with reduced ejection fraction) ([place]), History of DVT (deep vein thrombosis), History of pulmonary embolism, Hypertension, MI (myocardial infarction) ([place]), Morbid obesity ([name]), Neck pain (07/13/2018), NICM (nonischemic cardiomyopathy) ([place]), Persistent atrial fibrillation ([place]), Sleep apnea, and Sleep apnea.   Surgical History:   Past Surgical History:  Procedure Laterality Date  . CARDIOVERSION N/A 03/24/2018   Procedure: CARDIOVERSION;  Surgeon: Minna Merritts, MD;  Location: [facility] ORS;  Service: Cardiovascular;  Laterality: N/A;  . CARDIOVERSION N/A 08/08/2018   Procedure: CARDIOVERSION;  Surgeon: Nelva Bush, MD;  Location: [facility] ORS;  Service: Cardiovascular;  Laterality: N/A;  . hearnia repair     X 3- total of two surgeries  . HERNIA REPAIR    . LEG SURGERY       Social History:   reports that he has never smoked. He has never used smokeless tobacco. He reports current alcohol use. He reports that he does not use drugs.   Family History:  His family history includes Heart attack in his maternal grandfather and maternal grandmother; Heart failure in his mother; Lung cancer in his father and mother.   Allergies No Known Allergies   Home Medications  Prior to Admission medications   Medication Sig Start Date End Date Taking? Authorizing Provider  DULoxetine (CYMBALTA) 20 MG capsule Take 1 capsule (20 mg total) by mouth daily. 10/25/19  Yes Karamalegos, Devonne Doughty, DO  levothyroxine (SYNTHROID) 75 MCG tablet Take 1 tablet (75 mcg total) by mouth daily before breakfast. 01/13/20  Yes Fritzi Mandes, MD  meclizine (ANTIVERT) 25 MG tablet Take 0.5-1 tablets (12.5-25 mg total) by mouth 3 (three) times daily as needed for dizziness. 02/26/20  Yes Karamalegos, Devonne Doughty, DO  torsemide (DEMADEX) 20 MG tablet Take 4 tablets (80 mg total) by mouth 2 (two) times daily. 02/02/20  Yes Nicole Kindred A, DO  warfarin (COUMADIN) 5 MG tablet Take 5 mg by  mouth daily.  09/20/19  Yes [provider]  albuterol (VENTOLIN HFA) 108 (90 Base) MCG/ACT inhaler Inhale 2 puffs into the lungs every 4 (four) hours as needed for wheezing or shortness of breath. Patient not taking: No sig reported 10/25/19   [name], DO  amiodarone (PACERONE) 200 MG tablet Take 1 tablet (200 mg total) by mouth 2 (two) times daily. Patient not taking: Reported on 04/06/2020 01/12/20   Fritzi Mandes, MD  aspirin 81 MG chewable tablet Chew 1 tablet (81 mg total) by mouth daily. 02/03/20   Nicole Kindred A, DO  atorvastatin (LIPITOR) 40 MG tablet TAKE 1 TABLET BY MOUTH ONCE DAILY Patient taking differently: Take 40 mg by mouth daily.  08/15/19   Flinchum, Kelby Aline, FNP  carisoprodol (SOMA) 350 MG tablet Take 1 tablet (350 mg total) by mouth 3 (three) times daily as needed for muscle spasms. Patient not taking: No sig reported 02/02/20   Nicole Kindred A, DO  gabapentin (NEURONTIN) 300 MG capsule Take 300 mg by mouth 3 (three) times daily. 02/15/20   [provider]  lisinopril (ZESTRIL) 2.5 MG tablet Take by mouth. 03/06/19   [provider]  loperamide (IMODIUM) 2 MG capsule Take 1 capsule (2 mg total) by mouth as needed for diarrhea or loose stools. 06/26/19   Lorella Nimrod, MD  magnesium oxide (MAG-OX) 400 (241.3 Mg) MG tablet Take 1 tablet (400 mg total) by mouth 2 (two) times daily. 01/12/20   Fritzi Mandes, MD  metoprolol succinate (TOPROL-XL) 50 MG 24 hr tablet Take 2 tablets (100 mg total) by mouth daily. Take with or immediately following a meal. 01/12/20   Fritzi Mandes, MD  montelukast (SINGULAIR) 10 MG tablet Take 1 tablet (10 mg total) by mouth at bedtime. 10/12/19   Karamalegos, Devonne Doughty, DO  Multiple Vitamin (MULTIVITAMIN WITH MINERALS) TABS tablet Take 1 tablet by mouth daily.    [provider]  naphazoline-glycerin (CLEAR EYES REDNESS) 0.012-0.2 % SOLN Place 1-2 drops into both eyes 4 (four) times daily as needed for eye  irritation. 08/14/19   Loletha Grayer, MD  nitroGLYCERIN (NITROSTAT) 0.4 MG SL tablet Place 1 tablet under tongue every 5 minutes as needed for chest pain. (No more than 3 doses within 15 minutes) 03/08/19   Flinchum, Kelby Aline, FNP  potassium chloride (KLOR-CON) 20 MEQ packet Take 40 mEq by mouth daily. Patient not taking: Reported on 04/06/2020 01/12/20   Fritzi Mandes, MD  SUMAtriptan (IMITREX) 50 MG tablet Take 1 tablet (50 mg total) by mouth every 2 (two) hours as needed for migraine or headache. May repeat in 2 hours if headache persists or recurs. No more than 2 doses in a day 12/28/19   Jennye Boroughs, MD  thiamine 100 MG tablet Take 1 tablet (100 mg total) by mouth daily. Patient not taking: Reported on 04/06/2020 08/14/19   Loletha Grayer, MD  traZODone (DESYREL) 150 MG tablet Take 1 tablet (150 mg total) by mouth at bedtime. 10/25/19   [name], DO     Critical care time: 24 minutes    Darel Hong, [facility] [place] [facility] Pager: [phone]

## 2020-04-10 NOTE — Progress Notes (Signed)
PHARMACY CONSULT NOTE  Pharmacy Consult for Electrolyte Monitoring and Replacement   Recent Labs: Potassium (mmol/L)  Date Value  04/10/2020 4.4   Magnesium (mg/dL)  Date Value  16/11/9602 1.9   Calcium (mg/dL)  Date Value  54/10/8117 7.4 (L)   Albumin (g/dL)  Date Value  14/78/2956 2.5 (L)  05/02/2019 3.8   Phosphorus (mg/dL)  Date Value  21/30/8657 3.5   Sodium (mmol/L)  Date Value  04/10/2020 137  06/06/2019 139   Corrected Ca: 8.6 mg/dL  Assessment: 61 y/o male with h/o HTN, MI, COPD, GERD, OSA, morbid obesity, Afib, CHF, hernia repair and lymphedema who is admitted with UTI and COVID 19 currently sedated and ventilated. OGT in place to LIS  Diuretics: IV furosemide 40 mg BID  Nutrition: tube feeds  --free water 30 mL per tube q4h  Goal of Therapy:  Electrolytes WNL  Plan:   No replacement warranted for today  Re-check electrolytes in am  Lowella Bandy ,PharmD Clinical Pharmacist 04/10/2020 7:35 AM

## 2020-04-10 NOTE — Progress Notes (Signed)
ANTICOAGULATION CONSULT NOTE  Pharmacy Consult for argatroban Indication: atrial fibrillation  No Known Allergies  Patient Measurements: Height: 5\' 9"  (175.3 cm) Weight: (!) 193.8 kg (427 lb 4 oz) IBW/kg (Calculated) : 70.7 Heparin Dosing Weight: 117 kg  Vital Signs: Temp: 98.24 F (36.8 C) (02/24 0700) Temp Source: Esophageal (02/24 0400) BP: 104/73 (02/24 0700) Pulse Rate: 71 (02/24 0700)  Labs: Recent Labs    04/07/20 1224 04/07/20 1805 04/07/20 1805 04/07/20 2200 04/07/20 2248 04/08/20 0600 04/08/20 1137 04/09/20 0500 04/10/20 0600  HGB  --  10.9*   < >  --  10.8*  --   --  13.0  --   HCT  --  33.9*  --   --  33.2*  --   --  39.1  --   PLT  --  77*  --   --  77*  --   --  105*  --   APTT  --   --    < > 119*  --  91* 73* 62*  --   LABPROT  --   --   --  39.2*  --  34.9*  --   --   --   INR  --   --   --  4.2*  --  3.6*  --   --   --   HEPARINUNFRC 0.43  --   --   --   --   --   --   --   --   CREATININE  --   --    < >  --  1.13  --  1.06 1.19 1.06  TROPONINIHS 87*  --   --   --   --   --   --   --   --    < > = values in this interval not displayed.    Estimated Creatinine Clearance: 127.3 mL/min (by C-G formula based on SCr of 1.06 mg/dL).   Medical History: Past Medical History:  Diagnosis Date  . Allergy   . Anxiety   . Arthritis   . Asthma   . Brain damage   . Chronic pain of both knees 07/13/2018  . Clotting disorder ([facility])   . COPD (chronic obstructive pulmonary disease) ([facility])   . Depression   . GERD (gastroesophageal reflux disease)   . HFrEF (heart failure with reduced ejection fraction) (H[facility])    a. 03/2018 Echo: EF 25-30%, diff HK. Mod LAE.  04/2018 History of DVT (deep vein thrombosis)   . History of pulmonary embolism    a. Chronic coumadin.  [name] Hypertension   . MI (myocardial infarction) (H[facility])   . Morbid obesity (H[facility])   . Neck pain 07/13/2018  . NICM (nonischemic cardiomyopathy) (H[facility])    a. s/p Cath x 3 - reportedly nl cors. Last cath 2019  in [place]; b. a. 03/2018 Echo: EF 25-30%, diff HK.  04/2018 Persistent atrial fibrillation (HCC)    a. 03/2018 s/p DCCV; b. CHA2DS2VASc = 1-->Xarelto (later changed to warfarin); c. 05/2018 recurrent afib-->Amio initiated.  . Sleep apnea   . Sleep apnea     Assessment: 60 year old male presented with SOB. Patient with h/o atrial fibrillation on warfarin PTA admitted with a subtherapeutic INR. Pharmacy was consulted for heparin for afib. Thrombocytopenia on admission noted and slightly worse with bleeding found at multiple sites. LFTs, bilirubin and INR are all noted to be elevated at baseline, however Child-Pugh Score is 5 (class A)  Goal of Therapy:  aPTT 50 - 90 seconds Monitor platelets by anticoagulation protocol: Yes    Plan:   aPTT 56 this AM, remains therapeutic: continue argatroban infusion at 0.2 mcg/kg/min  Reheck aPTT in am  Repeat CBC in am  Burnis Medin, PharmD 04/10/2020 7:36 AM

## 2020-04-11 ENCOUNTER — Inpatient Hospital Stay: Payer: Medicaid Other

## 2020-04-11 DIAGNOSIS — J9602 Acute respiratory failure with hypercapnia: Secondary | ICD-10-CM | POA: Diagnosis not present

## 2020-04-11 DIAGNOSIS — R092 Respiratory arrest: Secondary | ICD-10-CM | POA: Diagnosis not present

## 2020-04-11 DIAGNOSIS — J9601 Acute respiratory failure with hypoxia: Secondary | ICD-10-CM | POA: Diagnosis not present

## 2020-04-11 DIAGNOSIS — U071 COVID-19: Secondary | ICD-10-CM | POA: Diagnosis not present

## 2020-04-11 LAB — TROPONIN I (HIGH SENSITIVITY): Troponin I (High Sensitivity): 17 ng/L (ref <18)

## 2020-04-11 LAB — CBC WITH DIFFERENTIAL/PLATELET
Abs Immature Granulocytes: 0.9 10*3/uL — ABNORMAL HIGH (ref 0.00–0.07)
Basophils Absolute: 0.1 10*3/uL (ref 0.0–0.1)
Basophils Relative: 1 %
Eosinophils Absolute: 0 10*3/uL (ref 0.0–0.5)
Eosinophils Relative: 0 %
HCT: 39.6 % (ref 39.0–52.0)
Hemoglobin: 12.6 g/dL — ABNORMAL LOW (ref 13.0–17.0)
Immature Granulocytes: 10 %
Lymphocytes Relative: 8 %
Lymphs Abs: 0.7 10*3/uL (ref 0.7–4.0)
MCH: 31.5 pg (ref 26.0–34.0)
MCHC: 31.8 g/dL (ref 30.0–36.0)
MCV: 99 fL (ref 80.0–100.0)
Monocytes Absolute: 1.2 10*3/uL — ABNORMAL HIGH (ref 0.1–1.0)
Monocytes Relative: 14 %
Neutro Abs: 5.8 10*3/uL (ref 1.7–7.7)
Neutrophils Relative %: 67 %
Platelets: 130 10*3/uL — ABNORMAL LOW (ref 150–400)
RBC: 4 MIL/uL — ABNORMAL LOW (ref 4.22–5.81)
RDW: 18.1 % — ABNORMAL HIGH (ref 11.5–15.5)
Smear Review: NORMAL
WBC: 8.7 10*3/uL (ref 4.0–10.5)
nRBC: 1.1 % — ABNORMAL HIGH (ref 0.0–0.2)

## 2020-04-11 LAB — COMPREHENSIVE METABOLIC PANEL
ALT: 86 U/L — ABNORMAL HIGH (ref 0–44)
AST: 183 U/L — ABNORMAL HIGH (ref 15–41)
Albumin: 3 g/dL — ABNORMAL LOW (ref 3.5–5.0)
Alkaline Phosphatase: 82 U/L (ref 38–126)
Anion gap: 8 (ref 5–15)
BUN: 41 mg/dL — ABNORMAL HIGH (ref 6–20)
CO2: 34 mmol/L — ABNORMAL HIGH (ref 22–32)
Calcium: 7.7 mg/dL — ABNORMAL LOW (ref 8.9–10.3)
Chloride: 97 mmol/L — ABNORMAL LOW (ref 98–111)
Creatinine, Ser: 1 mg/dL (ref 0.61–1.24)
GFR, Estimated: >60 mL/min (ref >60)
Glucose, Bld: 171 mg/dL — ABNORMAL HIGH (ref 70–99)
Potassium: 4.2 mmol/L (ref 3.5–5.1)
Sodium: 139 mmol/L (ref 135–145)
Total Bilirubin: 2.1 mg/dL — ABNORMAL HIGH (ref 0.3–1.2)
Total Protein: 6.9 g/dL (ref 6.5–8.1)

## 2020-04-11 LAB — GLUCOSE, CAPILLARY
Glucose-Capillary: 163 mg/dL — ABNORMAL HIGH (ref 70–99)
Glucose-Capillary: 150 mg/dL — ABNORMAL HIGH (ref 70–99)
Glucose-Capillary: 234 mg/dL — ABNORMAL HIGH (ref 70–99)
Glucose-Capillary: 200 mg/dL — ABNORMAL HIGH (ref 70–99)
Glucose-Capillary: 198 mg/dL — ABNORMAL HIGH (ref 70–99)
Glucose-Capillary: 209 mg/dL — ABNORMAL HIGH (ref 70–99)

## 2020-04-11 LAB — CULTURE, BLOOD (ROUTINE X 2)
Culture: NO GROWTH
Culture: NO GROWTH
Special Requests: ADEQUATE

## 2020-04-11 LAB — PHOSPHORUS: Phosphorus: 4.1 mg/dL (ref 2.5–4.6)

## 2020-04-11 LAB — C-REACTIVE PROTEIN: CRP: 4.7 mg/dL — ABNORMAL HIGH (ref <1.0)

## 2020-04-11 LAB — MAGNESIUM: Magnesium: 1.8 mg/dL (ref 1.7–2.4)

## 2020-04-11 LAB — APTT
aPTT: 50 seconds — ABNORMAL HIGH (ref 24–36)
aPTT: 47 seconds — ABNORMAL HIGH (ref 24–36)
aPTT: 46 seconds — ABNORMAL HIGH (ref 24–36)

## 2020-04-11 LAB — D-DIMER, QUANTITATIVE: D-Dimer, Quant: 0.86 ug/mL-FEU — ABNORMAL HIGH (ref 0.00–0.50)

## 2020-04-11 MED ORDER — VITAL 1.5 CAL PO LIQD: 1000.0000 mL | ORAL | Status: DC

## 2020-04-11 MED ORDER — FENTANYL CITRATE (PF) 100 MCG/2ML IJ SOLN
50.0000 ug | INTRAMUSCULAR | Status: DC | PRN
Start: 2020-04-11 — End: 2020-04-12

## 2020-04-11 MED ORDER — LEVOTHYROXINE SODIUM 50 MCG PO TABS
75.0000 ug | ORAL_TABLET | Freq: Every day | ORAL | Status: DC
  Administered 2020-04-12: 75 ug
  Filled 2020-04-11: qty 2

## 2020-04-11 MED ORDER — ADULT MULTIVITAMIN LIQUID CH
15.0000 mL | Freq: Every day | ORAL | Status: AC
  Administered 2020-04-12: 15 mL
  Filled 2020-04-11 (×3): qty 15

## 2020-04-11 MED ORDER — ACETAZOLAMIDE 250 MG PO TABS
500.0000 mg | ORAL_TABLET | Freq: Once | ORAL | Status: AC
  Administered 2020-04-11: 500 mg
  Filled 2020-04-11: qty 2

## 2020-04-11 MED ORDER — JUVEN PO PACK
1.0000 | PACK | Freq: Two times a day (BID) | ORAL | Status: DC
  Administered 2020-04-11 – 2020-04-12 (×3): 1

## 2020-04-11 MED ORDER — MAGNESIUM SULFATE 2 GM/50ML IV SOLN
2.0000 g | Freq: Once | INTRAVENOUS | Status: AC
  Administered 2020-04-11: 2 g via INTRAVENOUS
  Filled 2020-04-11: qty 50

## 2020-04-11 MED ORDER — PROSOURCE TF PO LIQD
90.0000 mL | Freq: Every day | ORAL | Status: DC
  Administered 2020-04-11 – 2020-04-12 (×5): 90 mL
  Filled 2020-04-11 (×9): qty 90

## 2020-04-11 MED ORDER — ARGATROBAN 50 MG/50ML IV SOLN
0.3500 ug/kg/min | INTRAVENOUS | Status: DC
  Administered 2020-04-11: 0.3 ug/kg/min via INTRAVENOUS
  Administered 2020-04-11: 0.25 ug/kg/min via INTRAVENOUS
  Administered 2020-04-12: 0.35 ug/kg/min via INTRAVENOUS
  Filled 2020-04-11 (×4): qty 50

## 2020-04-11 MED ORDER — FENTANYL CITRATE (PF) 100 MCG/2ML IJ SOLN
50.0000 ug | INTRAMUSCULAR | Status: DC | PRN
  Administered 2020-04-12: 100 ug via INTRAVENOUS
  Filled 2020-04-11: qty 2

## 2020-04-11 MED ORDER — METOLAZONE 5 MG PO TABS
5.0000 mg | ORAL_TABLET | Freq: Once | ORAL | Status: AC
  Administered 2020-04-11: 5 mg
  Filled 2020-04-11: qty 1

## 2020-04-11 NOTE — Progress Notes (Signed)
ANTICOAGULATION CONSULT NOTE  Pharmacy Consult for argatroban Indication: atrial fibrillation  No Known Allergies  Patient Measurements: Height: 5\' 9"  (175.3 cm) Weight: (!) 193.2 kg (426 lb) IBW/kg (Calculated) : 70.7 Heparin Dosing Weight: 117 kg  Vital Signs: Temp: 98.96 F (37.2 C) (02/25 2200) Temp Source: Esophageal (02/25 2000) BP: 140/77 (02/25 2200) Pulse Rate: 76 (02/25 2200)  Labs: Recent Labs    04/09/20 0500 04/10/20 0600 04/11/20 0516 04/11/20 1253 04/11/20 2138  HGB 13.0 12.3* 12.6*  --   --   HCT 39.1 39.5 39.6  --   --   PLT 105* 108* 130*  --   --   APTT 62* 56* 50* 47* 46*  CREATININE 1.19 1.06 1.00  --   --   TROPONINIHS  --   --   --  17  --     Estimated Creatinine Clearance: 134.7 mL/min (by C-G formula based on SCr of 1 mg/dL).   Medical History: Past Medical History:  Diagnosis Date  . Allergy   . Anxiety   . Arthritis   . Asthma   . Brain damage   . Chronic pain of both knees 07/13/2018  . Clotting disorder (HCC)   . COPD (chronic obstructive pulmonary disease) (HCC)   . Depression   . GERD (gastroesophageal reflux disease)   . HFrEF (heart failure with reduced ejection fraction) (HCC)    a. 03/2018 Echo: EF 25-30%, diff HK. Mod LAE.  04/2018 History of DVT (deep vein thrombosis)   . History of pulmonary embolism    a. Chronic coumadin.  [name] Hypertension   . MI (myocardial infarction) (HCC)   . Morbid obesity (HCC)   . Neck pain 07/13/2018  . NICM (nonischemic cardiomyopathy) (HCC)    a. s/p Cath x 3 - reportedly nl cors. Last cath 2019 in [place]; b. a. 03/2018 Echo: EF 25-30%, diff HK.  04/2018 Persistent atrial fibrillation (HCC)    a. 03/2018 s/p DCCV; b. CHA2DS2VASc = 1-->Xarelto (later changed to warfarin); c. 05/2018 recurrent afib-->Amio initiated.  . Sleep apnea   . Sleep apnea     Assessment: 60 year old male presented with SOB. Patient with h/o atrial fibrillation on warfarin PTA admitted with a subtherapeutic INR. Pharmacy was  consulted for heparin for afib. Thrombocytopenia on admission noted and slightly worse with bleeding found at multiple sites. LFTs, bilirubin and INR are all noted to be elevated at baseline, however Child-Pugh Score is 5 (class A)  Goal of Therapy:  aPTT 50 - 90 seconds Monitor platelets by anticoagulation protocol: Yes    Plan:   aPTT sub-therapeutic and trending downward.: increase argatroban infusion to 0.35 mcg/kg/min  Reheck aPTT in 4 hours   Repeat CBC in am  46, PharmD, BCPS 04/11/2020 10:34 PM

## 2020-04-11 NOTE — Progress Notes (Signed)
ANTICOAGULATION CONSULT NOTE  Pharmacy Consult for argatroban Indication: atrial fibrillation  No Known Allergies  Patient Measurements: Height: 5\' 9"  (175.3 cm) Weight: (!) 193.2 kg (426 lb) IBW/kg (Calculated) : 70.7 Heparin Dosing Weight: 117 kg  Vital Signs: Temp: 95.36 F (35.2 C) (02/25 0600) BP: 113/102 (02/25 0600) Pulse Rate: 59 (02/25 0600)  Labs: Recent Labs    04/09/20 0500 04/10/20 0600 04/11/20 0516  HGB 13.0 12.3* 12.6*  HCT 39.1 39.5 39.6  PLT 105* 108* 130*  APTT 62* 56* 50*  CREATININE 1.19 1.06 1.00    Estimated Creatinine Clearance: 134.7 mL/min (by C-G formula based on SCr of 1 mg/dL).   Medical History: Past Medical History:  Diagnosis Date  . Allergy   . Anxiety   . Arthritis   . Asthma   . Brain damage   . Chronic pain of both knees 07/13/2018  . Clotting disorder (HCC)   . COPD (chronic obstructive pulmonary disease) (HCC)   . Depression   . GERD (gastroesophageal reflux disease)   . HFrEF (heart failure with reduced ejection fraction) (HCC)    a. 03/2018 Echo: EF 25-30%, diff HK. Mod LAE.  04/2018 History of DVT (deep vein thrombosis)   . History of pulmonary embolism    a. Chronic coumadin.  [name] Hypertension   . MI (myocardial infarction) (HCC)   . Morbid obesity (HCC)   . Neck pain 07/13/2018  . NICM (nonischemic cardiomyopathy) (HCC)    a. s/p Cath x 3 - reportedly nl cors. Last cath 2019 in [place]; b. a. 03/2018 Echo: EF 25-30%, diff HK.  04/2018 Persistent atrial fibrillation (HCC)    a. 03/2018 s/p DCCV; b. CHA2DS2VASc = 1-->Xarelto (later changed to warfarin); c. 05/2018 recurrent afib-->Amio initiated.  . Sleep apnea   . Sleep apnea     Assessment: 60 year old male presented with SOB. Patient with h/o atrial fibrillation on warfarin PTA admitted with a subtherapeutic INR. Pharmacy was consulted for heparin for afib. Thrombocytopenia on admission noted and slightly worse with bleeding found at multiple sites. LFTs, bilirubin and INR are all  noted to be elevated at baseline, however Child-Pugh Score is 5 (class A)  Goal of Therapy:  aPTT 50 - 90 seconds Monitor platelets by anticoagulation protocol: Yes    Plan:   aPTT sub-therapeutic and trending downward.: increase argatroban infusion to 0.30 mcg/kg/min  Reheck aPTT in 4 hours to reconfirm therapeutic, then daily  Repeat CBC in am  46, PharmD 04/11/2020 7:12 AM

## 2020-04-11 NOTE — Progress Notes (Addendum)
PHARMACY CONSULT NOTE  Pharmacy Consult for Electrolyte Monitoring and Replacement   Recent Labs: Potassium (mmol/L)  Date Value  04/11/2020 4.2   Magnesium (mg/dL)  Date Value  17/35/6701 1.8   Calcium (mg/dL)  Date Value  41/04/129 7.7 (L)   Albumin (g/dL)  Date Value  43/88/8757 3.0 (L)  05/02/2019 3.8   Phosphorus (mg/dL)  Date Value  97/28/2060 4.1   Sodium (mmol/L)  Date Value  04/11/2020 139  06/06/2019 139   Corrected Ca: 8.5 mg/dL  Assessment: 60 y/o male with h/o HTN, MI, COPD, GERD, OSA, morbid obesity, Afib, CHF, hernia repair and lymphedema who is admitted with UTI and COVID 19 currently sedated and ventilated. OGT in place to LIS  Diuretics:  acetazolamide 500 mg per tube x 1  Nutrition: tube feeds  --free water 30 mL per tube q4h  Goal of Therapy:  Electrolytes WNL  Plan:   2 grams IV magnesium sulfate x 1  Re-check electrolytes in am  Lowella Bandy ,PharmD Clinical Pharmacist 04/11/2020 7:10 AM

## 2020-04-11 NOTE — Progress Notes (Signed)
ANTICOAGULATION CONSULT NOTE  Pharmacy Consult for argatroban Indication: atrial fibrillation  No Known Allergies  Patient Measurements: Height: 5\' 9"  (175.3 cm) Weight: (!) 193.2 kg (426 lb) IBW/kg (Calculated) : 70.7 Heparin Dosing Weight: 117 kg  Vital Signs: Temp: 95.36 F (35.2 C) (02/25 0600) BP: 113/102 (02/25 0600) Pulse Rate: 59 (02/25 0600)  Labs: Recent Labs    04/09/20 0500 04/10/20 0600 04/11/20 0516  HGB 13.0 12.3* 12.6*  HCT 39.1 39.5 39.6  PLT 105* 108* 130*  APTT 62* 56* 50*  CREATININE 1.19 1.06 1.00    Estimated Creatinine Clearance: 134.7 mL/min (by C-G formula based on SCr of 1 mg/dL).   Medical History: Past Medical History:  Diagnosis Date  . Allergy   . Anxiety   . Arthritis   . Asthma   . Brain damage   . Chronic pain of both knees 07/13/2018  . Clotting disorder (HCC)   . COPD (chronic obstructive pulmonary disease) (HCC)   . Depression   . GERD (gastroesophageal reflux disease)   . HFrEF (heart failure with reduced ejection fraction) (HCC)    a. 03/2018 Echo: EF 25-30%, diff HK. Mod LAE.  04/2018 History of DVT (deep vein thrombosis)   . History of pulmonary embolism    a. Chronic coumadin.  [name] Hypertension   . MI (myocardial infarction) (HCC)   . Morbid obesity (HCC)   . Neck pain 07/13/2018  . NICM (nonischemic cardiomyopathy) (HCC)    a. s/p Cath x 3 - reportedly nl cors. Last cath 2019 in [place]; b. a. 03/2018 Echo: EF 25-30%, diff HK.  04/2018 Persistent atrial fibrillation (HCC)    a. 03/2018 s/p DCCV; b. CHA2DS2VASc = 1-->Xarelto (later changed to warfarin); c. 05/2018 recurrent afib-->Amio initiated.  . Sleep apnea   . Sleep apnea     Assessment: 60 year old male presented with SOB. Patient with h/o atrial fibrillation on warfarin PTA admitted with a subtherapeutic INR. Pharmacy was consulted for heparin for afib. Thrombocytopenia on admission noted and slightly worse with bleeding found at multiple sites. LFTs, bilirubin and INR are all  noted to be elevated at baseline, however Child-Pugh Score is 5 (class A)  Goal of Therapy:  aPTT 50 - 90 seconds Monitor platelets by anticoagulation protocol: Yes    Plan:   aPTT 50 this AM, remains therapeutic but borderline subtherapeutic and trending downward.: increase argatroban infusion to 0.25 mcg/kg/min  Reheck aPTT in 4 hours to reconfirm therapeutic, then daily  Repeat CBC in am  46, PharmD 04/11/2020 6:15 AM

## 2020-04-11 NOTE — Progress Notes (Signed)
NAME:  [name] [name], MRN:  [id number], DOB:  February 14, 1961, LOS: 5 ADMISSION DATE:  04/06/2020, CONSULTATION DATE:  04/07/2019 REFERRING MD:  Dr. Tamala Julian, CHIEF COMPLAINT:  Shortness of Breath; s/p Cardiac Arrest  Brief History:  56. Y.o. Male admitted with Acute on Chronic Respiratory Failure in the setting of COVID-19 Pneumonia and superimposed MRSA Pneumonia. Initially placed on BiPAP.  Suffered Cardiac arrest (asystole) with ROSC obtained in 10 minutes.  History of Present Illness:  Patient is a 60 year old male with supra morbid obesity, obesity hypoventilation syndrome and chronic hypoxic respiratory failure due to the same who presented to [facility] ED due to increased shortness of breath 4 hours prior to admission. His past medical history is as noted below. Per history he had myalgias and cough. He had been on his CPAP prior to presenting to the emergency room but found no relief from it.  Patient was being evaluated in the emergency room and was placed on BiPAP.  However shortly after being placed on BiPAP he became unresponsive and pulseless.  He was noted to be in asystole and received ACLS protocols for approximately 10 minutes until ROSC was obtained.  He was intubated during the procedure.  He is now intubated and mechanically ventilated.  No further history can be obtained.  In the ED laboratory data has no surface patient is COVID 19 positive.  Past Medical History:  Sleep apnea COPD  Atrial fibrillation Nonischemic cardiomyopathy Myocardial infarction HFrEF Hypertension DVT GERD  Depression Arthritis Chronic pain Morbid obesity   Significant Hospital Events:  2/20: Presented to ED; suffered Respiratory/Cardiac arrest (10 minutes of ACLS before ROSC); intubated 2/21: FiO2 weaned to 45%; Diuresis today 2/22: Failed SBT (pt with increased work of breathing and tachycardia 150's); will Diurese again today; Urine culture with Vancomycin Resistant Enterococcus Faecium~placed on  Zyvox 2/23: Plan for SBT, Precedex started to assist with SBT 2/24: Failed SBT; increasing Diuresis to BID; Tracheal aspirate from admission with MRSA PNA 2/25: Diuresed 3L yesterday with increased Lasix ( approx. net even since admit), today with mild metabolic alkalosis, hold lasix and give Diamox, plan for SBT as tolerated  Consults:  Wound Care  Procedures:  2/20: Tracheal intubation 2/20: Right IJ CVC placed  Significant Diagnostic Tests:  2/20: CTA Chest>>1. No obvious large central pulmonary artery embolus identified. 2. Probable trace bilateral pleural effusions. Bilateral lower lobe subsegmental and streaky densities may represent atelectasis or scarring. Infiltrate is not excluded. 3. Aortic Atherosclerosis 2/21: Echocardiogram>>1. Left ventricular ejection fraction, by estimation, is 30 to 35%. Left  ventricular ejection fraction by PLAX is 21 %. The left ventricle has  moderate to severely decreased function. Left ventricular endocardial  border not optimally defined to evaluate  regional wall motion. The left ventricular internal cavity size was  mildly dilated. Left ventricular diastolic parameters were normal.  2. Right ventricular systolic function is normal. The right ventricular  size is mildly enlarged.  3. Left atrial size was mildly dilated.  4. Right atrial size was mildly dilated.  5. The mitral valve was not well visualized. No evidence of mitral valve  regurgitation.  6. The aortic valve was not well visualized. Aortic valve regurgitation  is not visualized.  Micro Data:  2/20: SARS-CoV-2 PCR>> positive 2/20: Influenza A/B PCR>>negative 2/20: Blood culture x2>> 2/20: Urine>>20,000 COLONIES/mL ENTEROCOCCUS FAECIUM  VANCOMYCIN RESISTANT ENTEROCOCCUS ISOLATED  2/21: MRSA PCR>> positive 2/21: Tracheal aspirate>> MRSA  Antimicrobials:  Remdesivir 2/20>>2/24 Ceftriaxone 2/21>>2/22 Linezolid 2/22>>  Interim History / Subjective:  -No acute  events noted overnight -Hypothermic -Hemodynamically stable, A-fib (rate controlled), NO pressors -Vent: Pressure Control: rate 20, 35% FiO2 -Urine output 3L yesterday; net even fluid balance since admission; Creatine remains stable -Pt with mild metabolic alkalosis; will hold lasix and give Diamox + Metolazone   Objective   Blood pressure (!) 113/102, pulse (!) 59, temperature (!) 95.36 F (35.2 C), resp. rate 16, height 5' 9" (1.753 m), weight (!) 193.2 kg, SpO2 95 %.    Vent Mode: PCV FiO2 (%):  [36 %] 36 % Set Rate:  [20 bmp] 20 bmp PEEP:  [5 cmH20-10 cmH20] 10 cmH20 Pressure Support:  [5 cmH20] 5 cmH20 Plateau Pressure:  [19 cmH20] 19 cmH20   Intake/Output Summary (Last 24 hours) at 04/11/2020 2585 Last data filed at 04/11/2020 0600 Gross per 24 hour  Intake 3693.36 ml  Output 3050 ml  Net 643.36 ml   Filed Weights   04/09/20 0449 04/10/20 0500 04/11/20 0500  Weight: (!) 191.4 kg (!) 193.8 kg (!) 193.2 kg    Examination: General: Acute on chronically ill appearing male, laying in bed, intubated and sedated, in NAD   HENT: Atraumatic, normocephalic, neck supple, orotracheally intubated, MM moist and pink   Lungs: Distant breath sounds, synchronous with vent, even  Cardiovascular: Irregularly irregular rhythm (A.fib), rate controlled, no M/R/G  Abdomen: Obese, soft, nontender, nondistended, no guarding or rebound tenderness, BS+ x4 Extremities: Bilateral lymphedema, 3-4+ pitting  Neuro: Lightly sedated, arouses to voice, follows simple commands when sedation lightened. Pupils  PERRL GU: Foley catheter in place draining yellow urine Skin: Warm and dry.  Chronic venous stasis ulcers to bilateral LE with weeping, bilateral unna boots clean, dry and intact  Resolved Hospital Problem list   N/A  Assessment & Plan:   Acute on chronic respiratory failure with hypoxia and hypercarbia in the setting of COVID-19 infection, superimposed MRSA Pneumonia, & Pulmonary Vascular  Congestion Status post Respiratory Arrest Underlying Obesity with Obesity Hypoventilation syndrome -Mechanical ventilation via ARDS protocol, target PRVC 6 cc/kg -Wean PEEP and FiO2 as able to maintain O2 sats >88% -Goal plateau pressure less than 30, driving pressure less than 15 -Paralytics if necessary for vent synchrony, gas exchange -Cycle prone positioning if necessary for oxygenation -Deep sedation per PAD protocol, goal RASS -4, currently fentanyl, midazolam -Follow intermittent CXR and ABG as needed -Spontaneous breathing trials when respiratory parameters met -Diuresis as blood pressure and renal function can tolerate, goal CVP 5-8.   -VAP prevention order set -Completed course of Remdesivir on  04/10/20 -IV Steroids (Solumedrol 60 mg BID) -Follow inflammatory markers: Ferritin, D-dimer, CRP -Vitamin C, zinc -Tracheal aspirate form 2/21 with MRSA -CTA Chest on 2/20 negative for PE -Maintain airborne and contact precautions   Cardiac arrest (asysole, ROSC obtained in 10 minutes), suspect due to Respiratory Arrest Troponin elevation, likely demand ischemia Acute on Chronic HFrEF (LVEF 30-35%) Atrial Fibrillation -Continuous cardiac monitoring -Maintain MAP >65 -Trend Troponin until downtrending (78 ~ 133 ~ 87 ) -Continue Argatroban for anticoagulation  -Echocardiogram pending~ LVEF 30-35%, normal LV diastolic parameters -Diuresis as BP and renal function permits ~ holding Lasix today 2/77 due to metabolic alkalosis, will give 500 mg Diamox and 5 mg Metolazone    Enterococcus Faecium (Vancomycin Resistant) UTI Superimposed MRSA Pneumonia (pt admitted on 2/20, tracheal aspirate obtained 2/21 with MRSA, DOES NOT meet criteria for HAP/VAP). -Monitor fever curve -Trend WBC's and procalcitonin -Follow cultures as above -Continue  Zyvox    Hyponatremia/hypochloremia due to Volume Overload >>resolved Mild Metabolic Alkalosis, suspect  due to Lasix administration Mild  Hypokalemia (likely due to diuresis)>>resolved -Monitor I&O's / urinary output -Follow BMP -Ensure adequate renal perfusion -Avoid nephrotoxic agents as able -Replace electrolytes as indicated -Diuresis as Renal function and BAP permits ~ holding Lasix today 2/63 due to metabolic alkalosis, will give 500 mg Diamox and 5 mg Metolazone  -Pharmacy following for assistance in electrolyte replacement   Anemia Thrombocytopenia>>improving -Monitor for S/Sx of bleeding -Trend CBC -Argatroban for Anticoagulation/VTE Prophylaxis  -Transfuse for Hgb <7 -Transfuse platelets for platelet count < 50K with active bleeding -HIT antibody and Serotonin release assay both negative -DIC panel not consistent with DIC -Peripheral blood smear pending ~ Pathologist report: Normal WBC count and differential.  Neutrophils show reduced nuclear lobation, with numerous bilobed pelgeroid forms.  Normocytic anemia with mild anisocytosis and polychromasia.  No increase in schistocytes.  Thrombocytopenia, with rare giant platelets.  The cause of these findings is unclear from morphologic review alone.  Potential causes of cytopenias may include nutritional deficiencies, medication/toxin exposure, chronic inflammatory states, viral illness, and/or autoimmune/rheumatologic disease.   Hyperglycemia -CBG's -SSI -Follow ICU Hypo/Hyperglycemia protocol -Hemoglobin A1c 6.4   Super morbid obesity OSA/OHVS -This issue adds complexity to his management -We will use higher PEEP to avoid alveolar derecruiting -Will make weaning a challenge  Lymphedema -Wound Care Nurse consult, appreciate input, will follow recommendations   Best practice (evaluated daily)  Diet: NPO, tube feeds Pain/Anxiety/Delirium protocol (if indicated): Propofol, Fentanyl gtts VAP protocol (if indicated): Yes, implemented DVT prophylaxis: Argatroban GI prophylaxis: Pepcid Glucose control: SSI Mobility: Bedrest Disposition:ICU  Goals  of Care:  Last date of multidisciplinary goals of care discussion:04/11/2020 Family and staff present: Bedside RN.  Updated pt's sister Sammy Cassar via telephone on 04/11/20  Summary of discussion: Wake up assessment, SBT, diuresis Follow up goals of care discussion due: 04/12/2020 Code Status: Full code  Labs   CBC: Recent Labs  Lab 04/06/20 1111 04/07/20 0350 04/07/20 1805 04/07/20 2248 04/09/20 0500 04/10/20 0600 04/11/20 0516  WBC 8.0   < > 5.9 5.3 7.1 9.0 8.7  NEUTROABS 7.1  --   --  4.4 5.3 6.3 5.8  HGB 12.4*   < > 10.9* 10.8* 13.0 12.3* 12.6*  HCT 38.9*   < > 33.9* 33.2* 39.1 39.5 39.6  MCV 96.8   < > 95.5 96.5 96.3 97.8 99.0  PLT 90*   < > 77* 77* 105* 108* 130*   < > = values in this interval not displayed.    Basic Metabolic Panel: Recent Labs  Lab 04/07/20 0350 04/07/20 0947 04/07/20 2248 04/08/20 0600 04/08/20 1137 04/09/20 0500 04/10/20 0600 04/11/20 0516  NA 130*  --  131*  --  134* 135 137 139  K 4.1  --  3.3*  --  3.9 4.3 4.4 4.2  CL 90*  --  92*  --  95* 95* 95* 97*  CO2 29  --  28  --  _0 34*  GLUCOSE 168*  --  135*  --  145* 149* 148* 171*  BUN 30*  --  32*  --  31* 33* 34* 41*  CREATININE 1.12  --  1.13  --  1.06 1.19 1.06 1.00  CALCIUM 7.3*  --  6.9*  --  7.1* 7.4* 7.4* 7.7*  MG 1.7  --   --  2.0  --  2.0 1.9 1.8  PHOS <1.0* 2.2*  --  2.1*  --  1.9* 3.5 4.1   GFR: Estimated Creatinine Clearance: 134.7  mL/min (by C-G formula based on SCr of 1 mg/dL). Recent Labs  Lab 04/06/20 1111 04/06/20 2320 04/07/20 0350 04/07/20 0947 04/07/20 1805 04/07/20 2248 04/09/20 0500 04/10/20 0600 04/11/20 0516  PROCALCITON 0.23  --   --  0.42  --  0.55 0.34  --   --   WBC 8.0  --  4.7  --    < > 5.3 7.1 9.0 8.7  LATICACIDVEN 3.5* 1.4 1.1  --   --   --   --   --   --    < > = values in this interval not displayed.    Liver Function Tests: Recent Labs  Lab 04/06/20 1111 04/07/20 2248 04/09/20 0500 04/10/20 0600 04/11/20 0516  AST 144* 97*  171* 167* 183*  ALT 48* 36 59* 67* 86*  ALKPHOS 99 65 94 84 82  BILITOT 1.4* 1.6* 2.3* 1.9* 2.1*  PROT 7.6 6.2* 6.8 6.5 6.9  ALBUMIN 3.5 2.5* 2.6* 2.5* 3.0*   No results for input(s): LIPASE, AMYLASE in the last 168 hours. No results for input(s): AMMONIA in the last 168 hours.  ABG    Component Value Date/Time   PHART 7.42 04/09/2020 1648   PCO2ART 42 04/09/2020 1648   PO2ART 81 (L) 04/09/2020 1648   HCO3 27.2 04/09/2020 1648   ACIDBASEDEF 5.4 (H) 04/06/2020 1120   O2SAT 96.1 04/09/2020 1648     Coagulation Profile: Recent Labs  Lab 04/06/20 1111 04/07/20 2200 04/08/20 0600  INR 1.6* 4.2* 3.6*    Cardiac Enzymes: No results for input(s): CKTOTAL, CKMB, CKMBINDEX, TROPONINI in the last 168 hours.  HbA1C: HB A1C (BAYER DCA - WAIVED)  Date/Time Value Ref Range Status  01/05/2019 04:09 PM 6.0 <7.0 % Final    Comment:                                          Diabetic Adult            <7.0                                       Healthy Adult        4.3 - 5.7                                                           (DCCT/NGSP) American Diabetes Association's Summary of Glycemic Recommendations for Adults with Diabetes: Hemoglobin A1c <7.0%. More stringent glycemic goals (A1c <6.0%) may further reduce complications at the cost of increased risk of hypoglycemia.   10/17/2018 01:28 PM 5.7 <7.0 % Final    Comment:                                          Diabetic Adult            <7.0  Healthy Adult        4.3 - 5.7                                                           (DCCT/NGSP) American Diabetes Association's Summary of Glycemic Recommendations for Adults with Diabetes: Hemoglobin A1c <7.0%. More stringent glycemic goals (A1c <6.0%) may further reduce complications at the cost of increased risk of hypoglycemia.    Hgb A1c MFr Bld  Date/Time Value Ref Range Status  04/07/2020 10:48 PM 6.4 (H) 4.8 - 5.6 % Final    Comment:     (NOTE) Pre diabetes:          5.7%-6.4%  Diabetes:              >6.4%  Glycemic control for   <7.0% adults with diabetes   01/27/2020 04:54 AM 6.7 (H) 4.8 - 5.6 % Final    Comment:    (NOTE) Pre diabetes:          5.7%-6.4%  Diabetes:              >6.4%  Glycemic control for   <7.0% adults with diabetes     CBG: Recent Labs  Lab 04/10/20 1540 04/10/20 1932 04/10/20 2329 04/11/20 0307 04/11/20 0745  GLUCAP 155* 153* 172* 163* 150*    Review of Systems:   Unable to assess due to intubation and sedation  Past Medical History:  He,  has a past medical history of Allergy, Anxiety, Arthritis, Asthma, Brain damage, Chronic pain of both knees (07/13/2018), Clotting disorder ([place]), COPD (chronic obstructive pulmonary disease) ([place]), Depression, GERD (gastroesophageal reflux disease), HFrEF (heart failure with reduced ejection fraction) ([place]), History of DVT (deep vein thrombosis), History of pulmonary embolism, Hypertension, MI (myocardial infarction) ([place]), Morbid obesity ([place]), Neck pain (07/13/2018), NICM (nonischemic cardiomyopathy) ([place]), Persistent atrial fibrillation ([place]), Sleep apnea, and Sleep apnea.   Surgical History:   Past Surgical History:  Procedure Laterality Date  . CARDIOVERSION N/A 03/24/2018   Procedure: CARDIOVERSION;  Surgeon: Minna Merritts, MD;  Location: [facility] ORS;  Service: Cardiovascular;  Laterality: N/A;  . CARDIOVERSION N/A 08/08/2018   Procedure: CARDIOVERSION;  Surgeon: Nelva Bush, MD;  Location: [facility] ORS;  Service: Cardiovascular;  Laterality: N/A;  . hearnia repair     X 3- total of two surgeries  . HERNIA REPAIR    . LEG SURGERY       Social History:   reports that he has never smoked. He has never used smokeless tobacco. He reports current alcohol use. He reports that he does not use drugs.   Family History:  His family history includes Heart attack in his maternal grandfather and maternal grandmother; Heart failure in his mother;  Lung cancer in his father and mother.   Allergies No Known Allergies   Home Medications  Prior to Admission medications   Medication Sig Start Date End Date Taking? Authorizing Provider  DULoxetine (CYMBALTA) 20 MG capsule Take 1 capsule (20 mg total) by mouth daily. 10/25/19  Yes Karamalegos, Devonne Doughty, DO  levothyroxine (SYNTHROID) 75 MCG tablet Take 1 tablet (75 mcg total) by mouth daily before breakfast. 01/13/20  Yes Fritzi Mandes, MD  meclizine (ANTIVERT) 25 MG tablet Take 0.5-1 tablets (12.5-25 mg total) by mouth 3 (three) times daily as needed for dizziness.  02/26/20  Yes Karamalegos, Devonne Doughty, DO  torsemide (DEMADEX) 20 MG tablet Take 4 tablets (80 mg total) by mouth 2 (two) times daily. 02/02/20  Yes Nicole Kindred A, DO  warfarin (COUMADIN) 5 MG tablet Take 5 mg by mouth daily.  09/20/19  Yes [provider]  albuterol (VENTOLIN HFA) 108 (90 Base) MCG/ACT inhaler Inhale 2 puffs into the lungs every 4 (four) hours as needed for wheezing or shortness of breath. Patient not taking: No sig reported 10/25/19   [name], DO  amiodarone (PACERONE) 200 MG tablet Take 1 tablet (200 mg total) by mouth 2 (two) times daily. Patient not taking: Reported on 04/06/2020 01/12/20   Fritzi Mandes, MD  aspirin 81 MG chewable tablet Chew 1 tablet (81 mg total) by mouth daily. 02/03/20   Nicole Kindred A, DO  atorvastatin (LIPITOR) 40 MG tablet TAKE 1 TABLET BY MOUTH ONCE DAILY Patient taking differently: Take 40 mg by mouth daily. 08/15/19   Flinchum, Kelby Aline, FNP  carisoprodol (SOMA) 350 MG tablet Take 1 tablet (350 mg total) by mouth 3 (three) times daily as needed for muscle spasms. Patient not taking: No sig reported 02/02/20   Nicole Kindred A, DO  gabapentin (NEURONTIN) 300 MG capsule Take 300 mg by mouth 3 (three) times daily. 02/15/20   [provider]  lisinopril (ZESTRIL) 2.5 MG tablet Take by mouth. 03/06/19   [provider]  loperamide (IMODIUM) 2  MG capsule Take 1 capsule (2 mg total) by mouth as needed for diarrhea or loose stools. 06/26/19   Lorella Nimrod, MD  magnesium oxide (MAG-OX) 400 (241.3 Mg) MG tablet Take 1 tablet (400 mg total) by mouth 2 (two) times daily. 01/12/20   Fritzi Mandes, MD  metoprolol succinate (TOPROL-XL) 50 MG 24 hr tablet Take 2 tablets (100 mg total) by mouth daily. Take with or immediately following a meal. 01/12/20   Fritzi Mandes, MD  montelukast (SINGULAIR) 10 MG tablet Take 1 tablet (10 mg total) by mouth at bedtime. 10/12/19   Karamalegos, Devonne Doughty, DO  Multiple Vitamin (MULTIVITAMIN WITH MINERALS) TABS tablet Take 1 tablet by mouth daily.    [provider]  naphazoline-glycerin (CLEAR EYES REDNESS) 0.012-0.2 % SOLN Place 1-2 drops into both eyes 4 (four) times daily as needed for eye irritation. 08/14/19   Loletha Grayer, MD  nitroGLYCERIN (NITROSTAT) 0.4 MG SL tablet Place 1 tablet under tongue every 5 minutes as needed for chest pain. (No more than 3 doses within 15 minutes) 03/08/19   Flinchum, Kelby Aline, FNP  potassium chloride (KLOR-CON) 20 MEQ packet Take 40 mEq by mouth daily. Patient not taking: Reported on 04/06/2020 01/12/20   Fritzi Mandes, MD  SUMAtriptan (IMITREX) 50 MG tablet Take 1 tablet (50 mg total) by mouth every 2 (two) hours as needed for migraine or headache. May repeat in 2 hours if headache persists or recurs. No more than 2 doses in a day 12/28/19   Jennye Boroughs, MD  thiamine 100 MG tablet Take 1 tablet (100 mg total) by mouth daily. Patient not taking: Reported on 04/06/2020 08/14/19   Loletha Grayer, MD  traZODone (DESYREL) 150 MG tablet Take 1 tablet (150 mg total) by mouth at bedtime. 10/25/19   [name], DO     Critical care time: 15 minutes    Darel Hong, [facility] Pager: [phone]

## 2020-04-11 NOTE — Progress Notes (Addendum)
Nutrition Follow Up Note   DOCUMENTATION CODES:   Morbid obesity  INTERVENTION:   Change to Vital 1.5 _0 /hr + Pro-Source 26m 5 times daily via tube  Propofol: 33.5 ml/hr- provides 884kcal/day   Free water flushes 328mq4 hours to maintain tube patency   Regimen provides 1840kcal/day, 175g/day protein and 91319may free water (with propofol provides 2724kcal/day)  Liquid MVI daily via tube   Juven Fruit Punch BID, each serving provides 95kcal and 2.5g of protein (amino acids glutamine and arginine)  NUTRITION DIAGNOSIS:   Inadequate oral intake related to inability to eat (pt sedated and ventilated) as evidenced by NPO status.  GOAL:   Provide needs based on ASPEN/SCCM guidelines  -met with tube feeds  MONITOR:   Vent status,Labs,Weight trends,Skin,I & O's, tube feeding   ASSESSMENT:   [age]o male with h/o HTN, MI, COPD, GERD, OSA, morbid obesity, Afib, CHF, hernia repair and lymphedema who is admitted with UTI and COVID 19   Pt sedated and ventilated. OGT in place. Pt tolerating tube feeds well; will adjust tube feeds in setting of propofol increase.    Per chart, pt appears weight stable since admit  Medications reviewed and include: colace, insulin, synthroid, solu-medrol, pepcid, fentanyl, propofol   Labs reviewed: K 4.2 wnl, BUN 41(H), P 4.1, Mg 1.8 wnl cbgs- 163, 150 x 24 hrs  Patient is currently intubated on ventilator support MV: 10.7 L/min Temp (24hrs), Avg:97.3 F (36.3 C), Min:95 F (35 C), Max:99.14 F (37.3 C)  Propofol: 33.5ml72m  MAP- >65mm66mUOP- 3125ml 23met Order:   Diet Order            Diet NPO time specified  Diet effective now                EDUCATION NEEDS:   No education needs have been identified at this time  Skin:  Skin Assessment: Reviewed RN Assessment (Stage III R buttocks)  Last BM:  2/23- TYPE 6  Height:   Ht Readings from Last 1 Encounters:  04/06/20 _1  (1.753 m)    Weight:   Wt Readings from  Last 1 Encounters:  04/11/20 (!) 193.2 kg    Ideal Body Weight:  72.7 kg  BMI:  Body mass index is 62.91 kg/m.  Estimated Nutritional Needs:   Kcal:  2436kcal/day  Protein:  182g/day  Fluid:  2.2-2.5L/day  [name] [name] [name] [name], [name] [name] Please refer to [facility] and/or RD on-call/weekend/after hours pager

## 2020-04-12 ENCOUNTER — Inpatient Hospital Stay: Payer: Medicaid Other

## 2020-04-12 DIAGNOSIS — U071 COVID-19: Secondary | ICD-10-CM | POA: Diagnosis not present

## 2020-04-12 DIAGNOSIS — J9601 Acute respiratory failure with hypoxia: Secondary | ICD-10-CM

## 2020-04-12 DIAGNOSIS — J1282 Pneumonia due to coronavirus disease 2019: Secondary | ICD-10-CM

## 2020-04-12 DIAGNOSIS — J9602 Acute respiratory failure with hypercapnia: Secondary | ICD-10-CM

## 2020-04-12 DIAGNOSIS — R092 Respiratory arrest: Secondary | ICD-10-CM | POA: Diagnosis not present

## 2020-04-12 LAB — APTT
aPTT: 47 seconds — ABNORMAL HIGH (ref 24–36)
aPTT: 46 seconds — ABNORMAL HIGH (ref 24–36)
aPTT: 50 seconds — ABNORMAL HIGH (ref 24–36)
aPTT: 52 seconds — ABNORMAL HIGH (ref 24–36)

## 2020-04-12 LAB — COMPREHENSIVE METABOLIC PANEL
ALT: 84 U/L — ABNORMAL HIGH (ref 0–44)
AST: 141 U/L — ABNORMAL HIGH (ref 15–41)
Albumin: 2.7 g/dL — ABNORMAL LOW (ref 3.5–5.0)
Alkaline Phosphatase: 85 U/L (ref 38–126)
Anion gap: 10 (ref 5–15)
BUN: 49 mg/dL — ABNORMAL HIGH (ref 6–20)
CO2: 32 mmol/L (ref 22–32)
Calcium: 8.2 mg/dL — ABNORMAL LOW (ref 8.9–10.3)
Chloride: 94 mmol/L — ABNORMAL LOW (ref 98–111)
Creatinine, Ser: 0.96 mg/dL (ref 0.61–1.24)
GFR, Estimated: >60 mL/min (ref >60)
Glucose, Bld: 235 mg/dL — ABNORMAL HIGH (ref 70–99)
Potassium: 4.2 mmol/L (ref 3.5–5.1)
Sodium: 136 mmol/L (ref 135–145)
Total Bilirubin: 1.6 mg/dL — ABNORMAL HIGH (ref 0.3–1.2)
Total Protein: 6.4 g/dL — ABNORMAL LOW (ref 6.5–8.1)

## 2020-04-12 LAB — MAGNESIUM: Magnesium: 2.2 mg/dL (ref 1.7–2.4)

## 2020-04-12 LAB — CBC WITH DIFFERENTIAL/PLATELET
Abs Immature Granulocytes: 0.77 10*3/uL — ABNORMAL HIGH (ref 0.00–0.07)
Basophils Absolute: 0 10*3/uL (ref 0.0–0.1)
Basophils Relative: 0 %
Eosinophils Absolute: 0 10*3/uL (ref 0.0–0.5)
Eosinophils Relative: 0 %
HCT: 38.3 % — ABNORMAL LOW (ref 39.0–52.0)
Hemoglobin: 12.2 g/dL — ABNORMAL LOW (ref 13.0–17.0)
Immature Granulocytes: 12 %
Lymphocytes Relative: 6 %
Lymphs Abs: 0.4 10*3/uL — ABNORMAL LOW (ref 0.7–4.0)
MCH: 31.3 pg (ref 26.0–34.0)
MCHC: 31.9 g/dL (ref 30.0–36.0)
MCV: 98.2 fL (ref 80.0–100.0)
Monocytes Absolute: 1.1 10*3/uL — ABNORMAL HIGH (ref 0.1–1.0)
Monocytes Relative: 16 %
Neutro Abs: 4.4 10*3/uL (ref 1.7–7.7)
Neutrophils Relative %: 66 %
Platelets: 138 10*3/uL — ABNORMAL LOW (ref 150–400)
RBC: 3.9 MIL/uL — ABNORMAL LOW (ref 4.22–5.81)
RDW: 18.2 % — ABNORMAL HIGH (ref 11.5–15.5)
Smear Review: NORMAL
WBC: 6.7 10*3/uL (ref 4.0–10.5)
nRBC: 2.5 % — ABNORMAL HIGH (ref 0.0–0.2)

## 2020-04-12 LAB — TROPONIN I (HIGH SENSITIVITY): Troponin I (High Sensitivity): 28 ng/L — ABNORMAL HIGH (ref <18)

## 2020-04-12 LAB — GLUCOSE, CAPILLARY
Glucose-Capillary: 213 mg/dL — ABNORMAL HIGH (ref 70–99)
Glucose-Capillary: 173 mg/dL — ABNORMAL HIGH (ref 70–99)
Glucose-Capillary: 198 mg/dL — ABNORMAL HIGH (ref 70–99)
Glucose-Capillary: 223 mg/dL — ABNORMAL HIGH (ref 70–99)
Glucose-Capillary: 153 mg/dL — ABNORMAL HIGH (ref 70–99)
Glucose-Capillary: 145 mg/dL — ABNORMAL HIGH (ref 70–99)

## 2020-04-12 LAB — PHOSPHORUS: Phosphorus: 2.2 mg/dL — ABNORMAL LOW (ref 2.5–4.6)

## 2020-04-12 LAB — D-DIMER, QUANTITATIVE: D-Dimer, Quant: 0.48 ug/mL-FEU (ref 0.00–0.50)

## 2020-04-12 LAB — BRAIN NATRIURETIC PEPTIDE: B Natriuretic Peptide: 940.1 pg/mL — ABNORMAL HIGH (ref 0.0–100.0)

## 2020-04-12 LAB — C-REACTIVE PROTEIN: CRP: 2.7 mg/dL — ABNORMAL HIGH (ref <1.0)

## 2020-04-12 MED ORDER — POTASSIUM & SODIUM PHOSPHATES 280-160-250 MG PO PACK
2.0000 | PACK | ORAL | Status: AC
  Administered 2020-04-12: 2
  Filled 2020-04-12: qty 2

## 2020-04-12 MED ORDER — INSULIN ASPART 100 UNIT/ML ~~LOC~~ SOLN
0.0000 [IU] | SUBCUTANEOUS | Status: DC
  Administered 2020-04-12: 15:00:00 7 [IU] via SUBCUTANEOUS
  Administered 2020-04-12 (×2): 4 [IU] via SUBCUTANEOUS
  Administered 2020-04-12: 3 [IU] via SUBCUTANEOUS
  Administered 2020-04-12: 4 [IU] via SUBCUTANEOUS
  Administered 2020-04-12: 04:00:00 7 [IU] via SUBCUTANEOUS
  Administered 2020-04-13: 3 [IU] via SUBCUTANEOUS
  Filled 2020-04-12 (×7): qty 1

## 2020-04-12 MED ORDER — ACETAMINOPHEN 325 MG PO TABS
650.0000 mg | ORAL_TABLET | Freq: Four times a day (QID) | ORAL | Status: DC | PRN
  Administered 2020-04-13 – 2020-04-26 (×5): 650 mg via ORAL
  Filled 2020-04-12 (×5): qty 2

## 2020-04-12 MED ORDER — LEVOTHYROXINE SODIUM 75 MCG PO TABS
75.0000 ug | ORAL_TABLET | Freq: Every day | ORAL | Status: DC
  Administered 2020-04-13 – 2020-04-28 (×15): 75 ug via ORAL
  Filled 2020-04-12: qty 3
  Filled 2020-04-12 (×16): qty 1
  Filled 2020-04-12: qty 3
  Filled 2020-04-12: qty 1

## 2020-04-12 MED ORDER — ARGATROBAN 50 MG/50ML IV SOLN
0.5500 ug/kg/min | INTRAVENOUS | Status: DC
  Administered 2020-04-12: 0.45 ug/kg/min via INTRAVENOUS
  Administered 2020-04-12 – 2020-04-14 (×6): 0.55 ug/kg/min via INTRAVENOUS
  Filled 2020-04-12 (×6): qty 50

## 2020-04-12 MED ORDER — POLYETHYLENE GLYCOL 3350 17 G PO PACK: 17.0000 g | PACK | Freq: Every day | ORAL | Status: DC | PRN

## 2020-04-12 MED ORDER — DOCUSATE SODIUM 50 MG/5ML PO LIQD
100.0000 mg | Freq: Two times a day (BID) | ORAL | Status: DC | PRN
  Filled 2020-04-12: qty 10

## 2020-04-12 MED ORDER — ORAL CARE MOUTH RINSE
15.0000 mL | Freq: Two times a day (BID) | OROMUCOSAL | Status: DC
  Administered 2020-04-12 – 2020-04-28 (×20): 15 mL via OROMUCOSAL

## 2020-04-12 MED ORDER — IPRATROPIUM-ALBUTEROL 20-100 MCG/ACT IN AERS
1.0000 | INHALATION_SPRAY | Freq: Four times a day (QID) | RESPIRATORY_TRACT | Status: DC
  Administered 2020-04-12 – 2020-04-28 (×34): 1 via RESPIRATORY_TRACT
  Filled 2020-04-12: qty 4

## 2020-04-12 MED ORDER — POTASSIUM & SODIUM PHOSPHATES 280-160-250 MG PO PACK
2.0000 | PACK | ORAL | Status: DC
  Administered 2020-04-12: 2 via ORAL
  Filled 2020-04-12 (×3): qty 2

## 2020-04-12 MED ORDER — DOCUSATE SODIUM 50 MG/5ML PO LIQD: 100.0000 mg | Freq: Two times a day (BID) | ORAL | Status: DC | PRN

## 2020-04-12 NOTE — Progress Notes (Signed)
NAME:  [name] [name], MRN:  [id number], DOB:  06-Oct-1960, LOS: 6 ADMISSION DATE:  04/06/2020, CONSULTATION DATE:  04/07/2019 REFERRING MD:  Dr. Tamala Julian, CHIEF COMPLAINT:  Shortness of Breath; s/p Cardiac Arrest  Brief History:  41. Y.o. Male admitted with Acute on Chronic Respiratory Failure in the setting of COVID-19 Pneumonia and superimposed MRSA Pneumonia. Initially placed on BiPAP.  Suffered Cardiac arrest (asystole) with ROSC obtained in 10 minutes.  History of Present Illness:  Patient is a 60 year old male with supra morbid obesity, obesity hypoventilation syndrome and chronic hypoxic respiratory failure due to the same who presented to [facility] ED due to increased shortness of breath 4 hours prior to admission. His past medical history is as noted below. Per history he had myalgias and cough. He had been on his CPAP prior to presenting to the emergency room but found no relief from it.  Patient was being evaluated in the emergency room and was placed on BiPAP.  However shortly after being placed on BiPAP he became unresponsive and pulseless.  He was noted to be in asystole and received ACLS protocols for approximately 10 minutes until ROSC was obtained.  He was intubated during the procedure.  He is now intubated and mechanically ventilated.  No further history can be obtained.  In the ED laboratory data has no surface patient is COVID 19 positive.  Past Medical History:  Sleep apnea COPD  Atrial fibrillation Nonischemic cardiomyopathy Myocardial infarction HFrEF Hypertension DVT GERD  Depression Arthritis Chronic pain Morbid obesity   Significant Hospital Events:  2/20: Presented to ED; suffered Respiratory/Cardiac arrest (10 minutes of ACLS before ROSC); intubated 2/21: FiO2 weaned to 45%; Diuresis today 2/22: Failed SBT (pt with increased work of breathing and tachycardia 150's); will Diurese again today; Urine culture with Vancomycin Resistant Enterococcus Faecium~placed on  Zyvox 2/23: Plan for SBT, Precedex started to assist with SBT 2/24: Failed SBT; increasing Diuresis to BID; Tracheal aspirate from admission with MRSA PNA 2/25: Diuresed 3L yesterday with increased Lasix ( approx. net even since admit), today with mild metabolic alkalosis, hold lasix and give Diamox, plan for SBT as tolerated  Consults:  Wound Care  Procedures:  2/20: Tracheal intubation 2/20: Right IJ CVC placed  Significant Diagnostic Tests:  2/20: CTA Chest>>1. No obvious large central pulmonary artery embolus identified. 2. Probable trace bilateral pleural effusions. Bilateral lower lobe subsegmental and streaky densities may represent atelectasis or scarring. Infiltrate is not excluded. 3. Aortic Atherosclerosis 2/21: Echocardiogram>>1. Left ventricular ejection fraction, by estimation, is 30 to 35%. Left  ventricular ejection fraction by PLAX is 21 %. The left ventricle has  moderate to severely decreased function. Left ventricular endocardial  border not optimally defined to evaluate  regional wall motion. The left ventricular internal cavity size was  mildly dilated. Left ventricular diastolic parameters were normal.  2. Right ventricular systolic function is normal. The right ventricular  size is mildly enlarged.  3. Left atrial size was mildly dilated.  4. Right atrial size was mildly dilated.  5. The mitral valve was not well visualized. No evidence of mitral valve  regurgitation.  6. The aortic valve was not well visualized. Aortic valve regurgitation  is not visualized.  Micro Data:  2/20: SARS-CoV-2 PCR>> positive 2/20: Influenza A/B PCR>>negative 2/20: Blood culture x2>> 2/20: Urine>>20,000 COLONIES/mL ENTEROCOCCUS FAECIUM  VANCOMYCIN RESISTANT ENTEROCOCCUS ISOLATED  2/21: MRSA PCR>> positive 2/21: Tracheal aspirate>> MRSA  Antimicrobials:  Remdesivir 2/20>>2/24 Ceftriaxone 2/21>>2/22 Linezolid 2/22>>  Interim History / Subjective:  -No acute  events noted overnight -Hypothermic -Hemodynamically stable, A-fib (rate controlled), NO pressors -Vent: Pressure Control: rate 20, 35% FiO2 -Urine output 3L yesterday; net even fluid balance since admission; Creatine remains stable -Pt with mild metabolic alkalosis; will hold lasix and give Diamox + Metolazone   Objective   Blood pressure 125/66, pulse 94, temperature 97.7 F (36.5 C), temperature source Axillary, resp. rate 17, height 5' 9"  (1.753 m), weight (!) 193.2 kg, SpO2 95 %.    Vent Mode: PSV FiO2 (%):  [35 %] 35 % Set Rate:  [20 bmp] 20 bmp PEEP:  [8 cmH20-10 cmH20] 8 cmH20 Pressure Support:  [5 cmH20] 5 cmH20 Plateau Pressure:  [21 cmH20] 21 cmH20   Intake/Output Summary (Last 24 hours) at 04/12/2020 1520 Last data filed at 04/12/2020 1511 Gross per 24 hour  Intake 4081.47 ml  Output 4725 ml  Net -643.53 ml   Filed Weights   04/09/20 0449 04/10/20 0500 04/11/20 0500  Weight: (!) 191.4 kg (!) 193.8 kg (!) 193.2 kg    Examination: General: Acute on chronically ill appearing male, laying in bed, intubated and sedated, in NAD   HENT: Atraumatic, normocephalic, neck supple, orotracheally intubated, MM moist and pink   Lungs: Distant breath sounds, synchronous with vent, even  Cardiovascular: Irregularly irregular rhythm (A.fib), rate controlled, no M/R/G  Abdomen: Obese, soft, nontender, nondistended, no guarding or rebound tenderness, BS+ x4 Extremities: Bilateral lymphedema, 3-4+ pitting  Neuro: Lightly sedated, arouses to voice, follows simple commands when sedation lightened. Pupils  PERRL GU: Foley catheter in place draining yellow urine Skin: Warm and dry.  Chronic venous stasis ulcers to bilateral LE with weeping, bilateral unna boots clean, dry and intact  Resolved Hospital Problem list   N/A  Assessment & Plan:   Acute on chronic respiratory failure with hypoxia and hypercarbia in the setting of COVID-19 infection, superimposed MRSA Pneumonia, & Pulmonary  Vascular Congestion Status post Respiratory Arrest Underlying Obesity with Obesity Hypoventilation syndrome -Mechanical ventilation via ARDS protocol, target PRVC 6 cc/kg -Wean PEEP and FiO2 as able to maintain O2 sats >88% -Goal plateau pressure less than 30, driving pressure less than 15 -Paralytics if necessary for vent synchrony, gas exchange -Cycle prone positioning if necessary for oxygenation -Deep sedation per PAD protocol, goal RASS -4, currently fentanyl, midazolam -Follow intermittent CXR and ABG as needed -Spontaneous breathing trials when respiratory parameters met -Diuresis as blood pressure and renal function can tolerate, goal CVP 5-8.   -VAP prevention order set -Completed course of Remdesivir on  04/10/20 -IV Steroids (Solumedrol 60 mg BID) -Follow inflammatory markers: Ferritin, D-dimer, CRP -Vitamin C, zinc -Tracheal aspirate form 2/21 with MRSA -CTA Chest on 2/20 negative for PE -Maintain airborne and contact precautions  Volume reduction yesterday, and did well at SBT this morning, extubated now   Cardiac arrest (asysole, ROSC obtained in 10 minutes), suspect due to Respiratory Arrest Troponin elevation, likely demand ischemia Acute on Chronic HFrEF (LVEF 30-35%) Atrial Fibrillation -Continuous cardiac monitoring -Maintain MAP >65 -Trend Troponin until downtrending (78 ~ 133 ~ 87 ) -Continue Argatroban for anticoagulation  -Echocardiogram pending~ LVEF 30-35%, normal LV diastolic parameters -Diamox given yesterday, will give lasix today and follow bicarb on  BMP 30-32 goal for now  Enterococcus Faecium (Vancomycin Resistant) UTI Superimposed MRSA Pneumonia (pt admitted on 2/20, tracheal aspirate obtained 2/21 with MRSA, DOES NOT meet criteria for HAP/VAP). -Monitor fever curve -Trend WBC's and procalcitonin -Follow cultures as above -Continue  Zyvox    Hyponatremia/hypochloremia due to Volume Overload >>resolved Mild  Metabolic Alkalosis, suspect due  to Lasix administration Mild Hypokalemia (likely due to diuresis)>>resolved -Monitor I&O's / urinary output -Follow BMP -Ensure adequate renal perfusion -Avoid nephrotoxic agents as able -Replace electrolytes as indicated -Diuresis as Renal function and BAP permits ~ holding Lasix today 4/01 due to metabolic alkalosis, will give 500 mg Diamox and 5 mg Metolazone  -Pharmacy following for assistance in electrolyte replacement   Anemia Thrombocytopenia>>improving -Monitor for S/Sx of bleeding -Trend CBC -Argatroban for Anticoagulation/VTE Prophylaxis  -Transfuse for Hgb <7 -Transfuse platelets for platelet count < 50K with active bleeding -HIT antibody and Serotonin release assay both negative -DIC panel not consistent with DIC -Peripheral blood smear pending ~ Pathologist report: Normal WBC count and differential.  Neutrophils show reduced nuclear lobation, with numerous bilobed pelgeroid forms.  Normocytic anemia with mild anisocytosis and polychromasia.  No increase in schistocytes.  Thrombocytopenia, with rare giant platelets.  The cause of these findings is unclear from morphologic review alone.  Potential causes of cytopenias may include nutritional deficiencies, medication/toxin exposure, chronic inflammatory states, viral illness, and/or autoimmune/rheumatologic disease.   Hyperglycemia -CBG's -SSI -Follow ICU Hypo/Hyperglycemia protocol -Hemoglobin A1c 6.4   Super morbid obesity OSA/OHVS -This issue adds complexity to his management -We will use higher PEEP to avoid alveolar derecruiting -Will make weaning a challenge  Lymphedema -Wound Care Nurse consult, appreciate input, will follow recommendations   Best practice (evaluated daily)  Diet: NPO, tube feeds Pain/Anxiety/Delirium protocol (if indicated): Propofol, Fentanyl gtts VAP protocol (if indicated): Yes, implemented DVT prophylaxis: Argatroban GI prophylaxis: Pepcid Glucose control: SSI Mobility:  Bedrest Disposition:ICU  Goals of Care:  Last date of multidisciplinary goals of care discussion:04/11/2020 Family and staff present: Bedside RN.  Updated pt's sister Markise Haymer via telephone on 04/11/20  Summary of discussion: Wake up assessment, SBT, diuresis Follow up goals of care discussion due: 04/12/2020 Code Status: Full code  Labs   CBC: Recent Labs  Lab 04/07/20 2248 04/09/20 0500 04/10/20 0600 04/11/20 0516 04/12/20 0350  WBC 5.3 7.1 9.0 8.7 6.7  NEUTROABS 4.4 5.3 6.3 5.8 4.4  HGB 10.8* 13.0 12.3* 12.6* 12.2*  HCT 33.2* 39.1 39.5 39.6 38.3*  MCV 96.5 96.3 97.8 99.0 98.2  PLT 77* 105* 108* 130* 138*    Basic Metabolic Panel: Recent Labs  Lab 04/08/20 0600 04/08/20 1137 04/09/20 0500 04/10/20 0600 04/11/20 0516 04/12/20 0350  NA  --  134* 135 137 139 136  K  --  3.9 4.3 4.4 4.2 4.2  CL  --  95* 95* 95* 97* 94*  CO2  --  27 29 30  34* 32  GLUCOSE  --  145* 149* 148* 171* 235*  BUN  --  31* 33* 34* 41* 49*  CREATININE  --  1.06 1.19 1.06 1.00 0.96  CALCIUM  --  7.1* 7.4* 7.4* 7.7* 8.2*  MG 2.0  --  2.0 1.9 1.8 2.2  PHOS 2.1*  --  1.9* 3.5 4.1 2.2*   GFR: Estimated Creatinine Clearance: 140.3 mL/min (by C-G formula based on SCr of 0.96 mg/dL). Recent Labs  Lab 04/06/20 1111 04/06/20 2320 04/07/20 0350 04/07/20 0947 04/07/20 1805 04/07/20 2248 04/09/20 0500 04/10/20 0600 04/11/20 0516 04/12/20 0350  PROCALCITON 0.23  --   --  0.42  --  0.55 0.34  --   --   --   WBC 8.0  --  4.7  --    < > 5.3 7.1 9.0 8.7 6.7  LATICACIDVEN 3.5* 1.4 1.1  --   --   --   --   --   --   --    < > =  values in this interval not displayed.    Liver Function Tests: Recent Labs  Lab 04/07/20 2248 04/09/20 0500 04/10/20 0600 04/11/20 0516 04/12/20 0350  AST 97* 171* 167* 183* 141*  ALT 36 59* 67* 86* 84*  ALKPHOS 65 94 84 82 85  BILITOT 1.6* 2.3* 1.9* 2.1* 1.6*  PROT 6.2* 6.8 6.5 6.9 6.4*  ALBUMIN 2.5* 2.6* 2.5* 3.0* 2.7*   No results for input(s): LIPASE,  AMYLASE in the last 168 hours. No results for input(s): AMMONIA in the last 168 hours.  ABG    Component Value Date/Time   PHART 7.42 04/09/2020 1648   PCO2ART 42 04/09/2020 1648   PO2ART 81 (L) 04/09/2020 1648   HCO3 27.2 04/09/2020 1648   ACIDBASEDEF 5.4 (H) 04/06/2020 1120   O2SAT 96.1 04/09/2020 1648     Coagulation Profile: Recent Labs  Lab 04/06/20 1111 04/07/20 2200 04/08/20 0600  INR 1.6* 4.2* 3.6*    Cardiac Enzymes: No results for input(s): CKTOTAL, CKMB, CKMBINDEX, TROPONINI in the last 168 hours.  HbA1C: HB A1C (BAYER DCA - WAIVED)  Date/Time Value Ref Range Status  01/05/2019 04:09 PM 6.0 <7.0 % Final    Comment:                                          Diabetic Adult            <7.0                                       Healthy Adult        4.3 - 5.7                                                           (DCCT/NGSP) American Diabetes Association's Summary of Glycemic Recommendations for Adults with Diabetes: Hemoglobin A1c <7.0%. More stringent glycemic goals (A1c <6.0%) may further reduce complications at the cost of increased risk of hypoglycemia.   10/17/2018 01:28 PM 5.7 <7.0 % Final    Comment:                                          Diabetic Adult            <7.0                                       Healthy Adult        4.3 - 5.7                                                           (DCCT/NGSP) American Diabetes Association's Summary of Glycemic Recommendations for Adults with Diabetes: Hemoglobin A1c <7.0%. More stringent glycemic goals (A1c <6.0%) may further reduce complications  at the cost of increased risk of hypoglycemia.    Hgb A1c MFr Bld  Date/Time Value Ref Range Status  04/07/2020 10:48 PM 6.4 (H) 4.8 - 5.6 % Final    Comment:    (NOTE) Pre diabetes:          5.7%-6.4%  Diabetes:              >6.4%  Glycemic control for   <7.0% adults with diabetes   01/27/2020 04:54 AM 6.7 (H) 4.8 - 5.6 % Final    Comment:     (NOTE) Pre diabetes:          5.7%-6.4%  Diabetes:              >6.4%  Glycemic control for   <7.0% adults with diabetes     CBG: Recent Labs  Lab 04/11/20 2323 04/12/20 0337 04/12/20 0745 04/12/20 1106 04/12/20 1504  GLUCAP 209* 213* 173* 198* 223*    Review of Systems:   Unable to assess due to intubation and sedation  Past Medical History:  He,  has a past medical history of Allergy, Anxiety, Arthritis, Asthma, Brain damage, Chronic pain of both knees (07/13/2018), Clotting disorder ([place]), COPD (chronic obstructive pulmonary disease) ([place]), Depression, GERD (gastroesophageal reflux disease), HFrEF (heart failure with reduced ejection fraction) ([employer]), History of DVT (deep vein thrombosis), History of pulmonary embolism, Hypertension, MI (myocardial infarction) ([place]), Morbid obesity ([place]), Neck pain (07/13/2018), NICM (nonischemic cardiomyopathy) ([place]), Persistent atrial fibrillation ([place]), Sleep apnea, and Sleep apnea.   Surgical History:   Past Surgical History:  Procedure Laterality Date  . CARDIOVERSION N/A 03/24/2018   Procedure: CARDIOVERSION;  Surgeon: Minna Merritts, MD;  Location: [facility] ORS;  Service: Cardiovascular;  Laterality: N/A;  . CARDIOVERSION N/A 08/08/2018   Procedure: CARDIOVERSION;  Surgeon: Nelva Bush, MD;  Location: [facility] ORS;  Service: Cardiovascular;  Laterality: N/A;  . hearnia repair     X 3- total of two surgeries  . HERNIA REPAIR    . LEG SURGERY       Social History:   reports that he has never smoked. He has never used smokeless tobacco. He reports current alcohol use. He reports that he does not use drugs.   Family History:  His family history includes Heart attack in his maternal grandfather and maternal grandmother; Heart failure in his mother; Lung cancer in his father and mother.   Allergies No Known Allergies   Home Medications  Prior to Admission medications   Medication Sig Start Date End Date Taking? Authorizing Provider   DULoxetine (CYMBALTA) 20 MG capsule Take 1 capsule (20 mg total) by mouth daily. 10/25/19  Yes Karamalegos, Devonne Doughty, DO  levothyroxine (SYNTHROID) 75 MCG tablet Take 1 tablet (75 mcg total) by mouth daily before breakfast. 01/13/20  Yes Fritzi Mandes, MD  meclizine (ANTIVERT) 25 MG tablet Take 0.5-1 tablets (12.5-25 mg total) by mouth 3 (three) times daily as needed for dizziness. 02/26/20  Yes Karamalegos, Devonne Doughty, DO  torsemide (DEMADEX) 20 MG tablet Take 4 tablets (80 mg total) by mouth 2 (two) times daily. 02/02/20  Yes Nicole Kindred A, DO  warfarin (COUMADIN) 5 MG tablet Take 5 mg by mouth daily.  09/20/19  Yes [provider]  albuterol (VENTOLIN HFA) 108 (90 Base) MCG/ACT inhaler Inhale 2 puffs into the lungs every 4 (four) hours as needed for wheezing or shortness of breath. Patient not taking: No sig reported 10/25/19   [name], DO  amiodarone (PACERONE) 200  MG tablet Take 1 tablet (200 mg total) by mouth 2 (two) times daily. Patient not taking: Reported on 04/06/2020 01/12/20   Fritzi Mandes, MD  aspirin 81 MG chewable tablet Chew 1 tablet (81 mg total) by mouth daily. 02/03/20   Nicole Kindred A, DO  atorvastatin (LIPITOR) 40 MG tablet TAKE 1 TABLET BY MOUTH ONCE DAILY Patient taking differently: Take 40 mg by mouth daily. 08/15/19   Flinchum, Kelby Aline, FNP  carisoprodol (SOMA) 350 MG tablet Take 1 tablet (350 mg total) by mouth 3 (three) times daily as needed for muscle spasms. Patient not taking: No sig reported 02/02/20   Nicole Kindred A, DO  gabapentin (NEURONTIN) 300 MG capsule Take 300 mg by mouth 3 (three) times daily. 02/15/20   [provider]  lisinopril (ZESTRIL) 2.5 MG tablet Take by mouth. 03/06/19   [provider]  loperamide (IMODIUM) 2 MG capsule Take 1 capsule (2 mg total) by mouth as needed for diarrhea or loose stools. 06/26/19   Lorella Nimrod, MD  magnesium oxide (MAG-OX) 400 (241.3 Mg) MG tablet Take 1 tablet (400 mg  total) by mouth 2 (two) times daily. 01/12/20   Fritzi Mandes, MD  metoprolol succinate (TOPROL-XL) 50 MG 24 hr tablet Take 2 tablets (100 mg total) by mouth daily. Take with or immediately following a meal. 01/12/20   Fritzi Mandes, MD  montelukast (SINGULAIR) 10 MG tablet Take 1 tablet (10 mg total) by mouth at bedtime. 10/12/19   Karamalegos, Devonne Doughty, DO  Multiple Vitamin (MULTIVITAMIN WITH MINERALS) TABS tablet Take 1 tablet by mouth daily.    [provider]  naphazoline-glycerin (CLEAR EYES REDNESS) 0.012-0.2 % SOLN Place 1-2 drops into both eyes 4 (four) times daily as needed for eye irritation. 08/14/19   Loletha Grayer, MD  nitroGLYCERIN (NITROSTAT) 0.4 MG SL tablet Place 1 tablet under tongue every 5 minutes as needed for chest pain. (No more than 3 doses within 15 minutes) 03/08/19   Flinchum, Kelby Aline, FNP  potassium chloride (KLOR-CON) 20 MEQ packet Take 40 mEq by mouth daily. Patient not taking: Reported on 04/06/2020 01/12/20   Fritzi Mandes, MD  SUMAtriptan (IMITREX) 50 MG tablet Take 1 tablet (50 mg total) by mouth every 2 (two) hours as needed for migraine or headache. May repeat in 2 hours if headache persists or recurs. No more than 2 doses in a day 12/28/19   Jennye Boroughs, MD  thiamine 100 MG tablet Take 1 tablet (100 mg total) by mouth daily. Patient not taking: Reported on 04/06/2020 08/14/19   Loletha Grayer, MD  traZODone (DESYREL) 150 MG tablet Take 1 tablet (150 mg total) by mouth at bedtime. 10/25/19   [name], DO     Critical care time: 27 minutes    Darel Hong, [facility] Pager: [phone]

## 2020-04-12 NOTE — Progress Notes (Signed)
ANTICOAGULATION CONSULT NOTE  Pharmacy Consult for argatroban Indication: atrial fibrillation  No Known Allergies  Patient Measurements: Height: 5\' 9"  (175.3 cm) Weight: (!) 193.2 kg (426 lb) IBW/kg (Calculated) : 70.7 Heparin Dosing Weight: 117 kg  Vital Signs: Temp: 99.5 F (37.5 C) (02/26 0900) Temp Source: Esophageal (02/26 0830) BP: 84/72 (02/26 0930) Pulse Rate: 102 (02/26 0930)  Labs: Recent Labs    04/10/20 0600 04/11/20 0516 04/11/20 1253 04/11/20 2138 04/12/20 0350 04/12/20 0917  HGB 12.3* 12.6*  --   --  12.2*  --   HCT 39.5 39.6  --   --  38.3*  --   PLT 108* 130*  --   --  138*  --   APTT 56* 50* 47* 46* 47* 46*  CREATININE 1.06 1.00  --   --  0.96  --   TROPONINIHS  --   --  17  --  28*  --     Estimated Creatinine Clearance: 140.3 mL/min (by C-G formula based on SCr of 0.96 mg/dL).   Medical History: Past Medical History:  Diagnosis Date  . Allergy   . Anxiety   . Arthritis   . Asthma   . Brain damage   . Chronic pain of both knees 07/13/2018  . Clotting disorder ([facility])   . COPD (chronic obstructive pulmonary disease) ([facility])   . Depression   . GERD (gastroesophageal reflux disease)   . HFrEF (heart failure with reduced ejection fraction) (H[facility])    a. 03/2018 Echo: EF 25-30%, diff HK. Mod LAE.  04/2018 History of DVT (deep vein thrombosis)   . History of pulmonary embolism    a. Chronic coumadin.  [name] Hypertension   . MI (myocardial infarction) (H[facility])   . Morbid obesity (H[facility])   . Neck pain 07/13/2018  . NICM (nonischemic cardiomyopathy) (H[facility])    a. s/p Cath x 3 - reportedly nl cors. Last cath 2019 in [place]; b. a. 03/2018 Echo: EF 25-30%, diff HK.  04/2018 Persistent atrial fibrillation (H[facility])    a. 03/2018 s/p DCCV; b. CHA2DS2VASc = 1-->Xarelto (later changed to warfarin); c. 05/2018 recurrent afib-->Amio initiated.  . Sleep apnea   . Sleep apnea     Assessment: 60 year old male presented with SOB. Patient with h/o atrial fibrillation on warfarin PTA admitted  with a subtherapeutic INR. Pharmacy was consulted for heparin for afib. Thrombocytopenia on admission noted and slightly worse with bleeding found at multiple sites. LFTs, bilirubin and INR are all noted to be elevated at baseline, however Child-Pugh Score is 5 (class A). HIT antigen and SRA are negative. Will continue argatroban due to heparin shortage. Pt previously on warfarin PTA.   2/26 0350 aPTT 47  2/26 0917 aPTT 46   Goal of Therapy:  aPTT 50 - 90 seconds Monitor platelets by anticoagulation protocol: Yes    Plan:  APTT slightly sub-therapeutic. Will increase argatroban rate to 0.55 mcg/kg/min.   Reheck aPTT in 4 hours   Repeat CBC in am  3/26, PharmD, BCPS 04/12/2020 10:00 AM

## 2020-04-12 NOTE — Progress Notes (Signed)
ANTICOAGULATION CONSULT NOTE  Pharmacy Consult for argatroban Indication: atrial fibrillation  No Known Allergies  Patient Measurements: Height: 5\' 9"  (175.3 cm) Weight: (!) 193.2 kg (426 lb) IBW/kg (Calculated) : 70.7 Heparin Dosing Weight: 117 kg  Vital Signs: Temp: 97.9 F (36.6 C) (02/26 2000) Temp Source: Oral (02/26 2000) BP: 134/82 (02/26 2000) Pulse Rate: 96 (02/26 2000)  Labs: Recent Labs    04/10/20 0600 04/11/20 0516 04/11/20 1253 04/11/20 2138 04/12/20 0350 04/12/20 0917 04/12/20 1513 04/12/20 2026  HGB 12.3* 12.6*  --   --  12.2*  --   --   --   HCT 39.5 39.6  --   --  38.3*  --   --   --   PLT 108* 130*  --   --  138*  --   --   --   APTT 56* 50* 47*   < > 47* 46* 50* 52*  CREATININE 1.06 1.00  --   --  0.96  --   --   --   TROPONINIHS  --   --  17  --  28*  --   --   --    < > = values in this interval not displayed.    Estimated Creatinine Clearance: 140.3 mL/min (by C-G formula based on SCr of 0.96 mg/dL).   Medical History: Past Medical History:  Diagnosis Date  . Allergy   . Anxiety   . Arthritis   . Asthma   . Brain damage   . Chronic pain of both knees 07/13/2018  . Clotting disorder (HCC)   . COPD (chronic obstructive pulmonary disease) (HCC)   . Depression   . GERD (gastroesophageal reflux disease)   . HFrEF (heart failure with reduced ejection fraction) (HCC)    a. 03/2018 Echo: EF 25-30%, diff HK. Mod LAE.  04/2018 History of DVT (deep vein thrombosis)   . History of pulmonary embolism    a. Chronic coumadin.  [name] Hypertension   . MI (myocardial infarction) (HCC)   . Morbid obesity (HCC)   . Neck pain 07/13/2018  . NICM (nonischemic cardiomyopathy) (HCC)    a. s/p Cath x 3 - reportedly nl cors. Last cath 2019 in [place]; b. a. 03/2018 Echo: EF 25-30%, diff HK.  04/2018 Persistent atrial fibrillation (HCC)    a. 03/2018 s/p DCCV; b. CHA2DS2VASc = 1-->Xarelto (later changed to warfarin); c. 05/2018 recurrent afib-->Amio initiated.  . Sleep apnea   .  Sleep apnea     Assessment: 60 year old male presented with SOB. Patient with h/o atrial fibrillation on warfarin PTA admitted with a subtherapeutic INR. Pharmacy was consulted for heparin for afib. Thrombocytopenia on admission noted and slightly worse with bleeding found at multiple sites. LFTs, bilirubin and INR are all noted to be elevated at baseline, however Child-Pugh Score is 5 (class A). HIT antigen and SRA are negative. Will continue argatroban due to heparin shortage. Pt previously on warfarin PTA.   2/26 0350 aPTT 47  2/26 0917 aPTT 46  2/26 1513 aPTT 50 2/26 2026 aPTT 52  Goal of Therapy:  aPTT 50 - 90 seconds Monitor platelets by anticoagulation protocol: Yes    Plan:  APTT therapeutic. Will continue argatroban rate of 0.55 mcg/kg/min.   Reheck aPTT with AM labs   Repeat CBC in am  2027, PharmD, BCPS 04/12/2020 9:07 PM

## 2020-04-12 NOTE — Progress Notes (Signed)
ANTICOAGULATION CONSULT NOTE  Pharmacy Consult for argatroban Indication: atrial fibrillation  No Known Allergies  Patient Measurements: Height: 5\' 9"  (175.3 cm) Weight: (!) 193.2 kg (426 lb) IBW/kg (Calculated) : 70.7 Heparin Dosing Weight: 117 kg  Vital Signs: Temp: 99.32 F (37.4 C) (02/26 0400) Temp Source: Esophageal (02/25 2000) BP: 160/90 (02/26 0400) Pulse Rate: 63 (02/26 0400)  Labs: Recent Labs    04/10/20 0600 04/11/20 0516 04/11/20 1253 04/11/20 2138 04/12/20 0350  HGB 12.3* 12.6*  --   --   --   HCT 39.5 39.6  --   --   --   PLT 108* 130*  --   --   --   APTT 56* 50* 47* 46* 47*  CREATININE 1.06 1.00  --   --  0.96  TROPONINIHS  --   --  17  --  28*    Estimated Creatinine Clearance: 140.3 mL/min (by C-G formula based on SCr of 0.96 mg/dL).   Medical History: Past Medical History:  Diagnosis Date  . Allergy   . Anxiety   . Arthritis   . Asthma   . Brain damage   . Chronic pain of both knees 07/13/2018  . Clotting disorder (HCC)   . COPD (chronic obstructive pulmonary disease) (HCC)   . Depression   . GERD (gastroesophageal reflux disease)   . HFrEF (heart failure with reduced ejection fraction) (HCC)    a. 03/2018 Echo: EF 25-30%, diff HK. Mod LAE.  04/2018 History of DVT (deep vein thrombosis)   . History of pulmonary embolism    a. Chronic coumadin.  [name] Hypertension   . MI (myocardial infarction) (HCC)   . Morbid obesity (HCC)   . Neck pain 07/13/2018  . NICM (nonischemic cardiomyopathy) (HCC)    a. s/p Cath x 3 - reportedly nl cors. Last cath 2019 in [place]; b. a. 03/2018 Echo: EF 25-30%, diff HK.  04/2018 Persistent atrial fibrillation (HCC)    a. 03/2018 s/p DCCV; b. CHA2DS2VASc = 1-->Xarelto (later changed to warfarin); c. 05/2018 recurrent afib-->Amio initiated.  . Sleep apnea   . Sleep apnea     Assessment: 60 year old male presented with SOB. Patient with h/o atrial fibrillation on warfarin PTA admitted with a subtherapeutic INR. Pharmacy was  consulted for heparin for afib. Thrombocytopenia on admission noted and slightly worse with bleeding found at multiple sites. LFTs, bilirubin and INR are all noted to be elevated at baseline, however Child-Pugh Score is 5 (class A)  Goal of Therapy:  aPTT 50 - 90 seconds Monitor platelets by anticoagulation protocol: Yes    Plan:   APTT sub-therapeutic and trending downward.: increase argatroban rate to 0.45 mcg/kg/min  Reheck aPTT in 4 hours   Repeat CBC in am  46, PharmD, [facility] 04/12/2020 5:06 AM

## 2020-04-12 NOTE — Progress Notes (Signed)
ANTICOAGULATION CONSULT NOTE  Pharmacy Consult for argatroban Indication: atrial fibrillation  No Known Allergies  Patient Measurements: Height: 5\' 9"  (175.3 cm) Weight: (!) 193.2 kg (426 lb) IBW/kg (Calculated) : 70.7 Heparin Dosing Weight: 117 kg  Vital Signs: Temp: 97.7 F (36.5 C) (02/26 1500) Temp Source: Axillary (02/26 1500) BP: 125/66 (02/26 1500) Pulse Rate: 94 (02/26 1500)  Labs: Recent Labs    04/10/20 0600 04/11/20 0516 04/11/20 1253 04/11/20 2138 04/12/20 0350 04/12/20 0917 04/12/20 1513  HGB 12.3* 12.6*  --   --  12.2*  --   --   HCT 39.5 39.6  --   --  38.3*  --   --   PLT 108* 130*  --   --  138*  --   --   APTT 56* 50* 47*   < > 47* 46* 50*  CREATININE 1.06 1.00  --   --  0.96  --   --   TROPONINIHS  --   --  17  --  28*  --   --    < > = values in this interval not displayed.    Estimated Creatinine Clearance: 140.3 mL/min (by C-G formula based on SCr of 0.96 mg/dL).   Medical History: Past Medical History:  Diagnosis Date  . Allergy   . Anxiety   . Arthritis   . Asthma   . Brain damage   . Chronic pain of both knees 07/13/2018  . Clotting disorder (HCC)   . COPD (chronic obstructive pulmonary disease) (HCC)   . Depression   . GERD (gastroesophageal reflux disease)   . HFrEF (heart failure with reduced ejection fraction) (HCC)    a. 03/2018 Echo: EF 25-30%, diff HK. Mod LAE.  04/2018 History of DVT (deep vein thrombosis)   . History of pulmonary embolism    a. Chronic coumadin.  [name] Hypertension   . MI (myocardial infarction) (HCC)   . Morbid obesity (HCC)   . Neck pain 07/13/2018  . NICM (nonischemic cardiomyopathy) (HCC)    a. s/p Cath x 3 - reportedly nl cors. Last cath 2019 in [place]; b. a. 03/2018 Echo: EF 25-30%, diff HK.  04/2018 Persistent atrial fibrillation (HCC)    a. 03/2018 s/p DCCV; b. CHA2DS2VASc = 1-->Xarelto (later changed to warfarin); c. 05/2018 recurrent afib-->Amio initiated.  . Sleep apnea   . Sleep apnea     Assessment: 60 year  old male presented with SOB. Patient with h/o atrial fibrillation on warfarin PTA admitted with a subtherapeutic INR. Pharmacy was consulted for heparin for afib. Thrombocytopenia on admission noted and slightly worse with bleeding found at multiple sites. LFTs, bilirubin and INR are all noted to be elevated at baseline, however Child-Pugh Score is 5 (class A). HIT antigen and SRA are negative. Will continue argatroban due to heparin shortage. Pt previously on warfarin PTA.   2/26 0350 aPTT 47  2/26 0917 aPTT 46  2/26 1513 aPTT 50  Goal of Therapy:  aPTT 50 - 90 seconds Monitor platelets by anticoagulation protocol: Yes    Plan:  APTT therapeutic. Will continue argatroban rate of 0.55 mcg/kg/min.   Reheck aPTT in 4 hours   Repeat CBC in am  3/26, PharmD Pharmacy Resident  04/12/2020 4:20 PM

## 2020-04-12 NOTE — Progress Notes (Signed)
Extubation order written.  Cuff leak noted.  Patient extubated and placed on 2lpm [place].  Tolerated well.

## 2020-04-12 NOTE — Progress Notes (Signed)
PHARMACY CONSULT NOTE  Pharmacy Consult for Electrolyte Monitoring and Replacement   Recent Labs: Potassium (mmol/L)  Date Value  04/12/2020 4.2   Magnesium (mg/dL)  Date Value  11/91/4782 2.2   Calcium (mg/dL)  Date Value  95/62/1308 8.2 (L)   Albumin (g/dL)  Date Value  65/78/4696 2.7 (L)  05/02/2019 3.8   Phosphorus (mg/dL)  Date Value  29/52/8413 2.2 (L)   Sodium (mmol/L)  Date Value  04/12/2020 136  06/06/2019 139   Corrected Ca: 89.2 mg/dL  Assessment: 60 y/o male with h/o HTN, MI, COPD, GERD, OSA, morbid obesity, Afib, CHF, hernia repair and lymphedema who is admitted with UTI and COVID 19 currently sedated and ventilated. OGT in place to LIS  Diuretics:  acetazolamide 500 mg per tube x 1  Nutrition: tube feeds  --free water 30 mL per tube q4h  Goal of Therapy:  Electrolytes WNL  Plan:   KPhos packets x 2.   Re-check electrolytes in am  Ronnald Ramp ,PharmD Clinical Pharmacist 04/12/2020 8:11 AM

## 2020-04-12 NOTE — Progress Notes (Signed)
Dr. Earlie Server gave order for clear liquid diet.

## 2020-04-13 ENCOUNTER — Encounter: Payer: Self-pay | Admitting: Pulmonary Disease

## 2020-04-13 ENCOUNTER — Inpatient Hospital Stay: Payer: Medicaid Other

## 2020-04-13 DIAGNOSIS — R092 Respiratory arrest: Secondary | ICD-10-CM | POA: Diagnosis not present

## 2020-04-13 DIAGNOSIS — J9601 Acute respiratory failure with hypoxia: Secondary | ICD-10-CM | POA: Diagnosis not present

## 2020-04-13 LAB — GLUCOSE, CAPILLARY
Glucose-Capillary: 128 mg/dL — ABNORMAL HIGH (ref 70–99)
Glucose-Capillary: 130 mg/dL — ABNORMAL HIGH (ref 70–99)
Glucose-Capillary: 183 mg/dL — ABNORMAL HIGH (ref 70–99)
Glucose-Capillary: 166 mg/dL — ABNORMAL HIGH (ref 70–99)

## 2020-04-13 LAB — CBC WITH DIFFERENTIAL/PLATELET
Abs Immature Granulocytes: 1.16 10*3/uL — ABNORMAL HIGH (ref 0.00–0.07)
Basophils Absolute: 0.1 10*3/uL (ref 0.0–0.1)
Basophils Relative: 1 %
Eosinophils Absolute: 0 10*3/uL (ref 0.0–0.5)
Eosinophils Relative: 0 %
HCT: 38.6 % — ABNORMAL LOW (ref 39.0–52.0)
Hemoglobin: 12.3 g/dL — ABNORMAL LOW (ref 13.0–17.0)
Immature Granulocytes: 13 %
Lymphocytes Relative: 13 %
Lymphs Abs: 1.1 10*3/uL (ref 0.7–4.0)
MCH: 31 pg (ref 26.0–34.0)
MCHC: 31.9 g/dL (ref 30.0–36.0)
MCV: 97.2 fL (ref 80.0–100.0)
Monocytes Absolute: 1.7 10*3/uL — ABNORMAL HIGH (ref 0.1–1.0)
Monocytes Relative: 19 %
Neutro Abs: 4.8 10*3/uL (ref 1.7–7.7)
Neutrophils Relative %: 54 %
Platelets: 185 10*3/uL (ref 150–400)
RBC: 3.97 MIL/uL — ABNORMAL LOW (ref 4.22–5.81)
RDW: 18.1 % — ABNORMAL HIGH (ref 11.5–15.5)
Smear Review: NORMAL
WBC: 8.9 10*3/uL (ref 4.0–10.5)
nRBC: 1.1 % — ABNORMAL HIGH (ref 0.0–0.2)

## 2020-04-13 LAB — COMPREHENSIVE METABOLIC PANEL
ALT: 67 U/L — ABNORMAL HIGH (ref 0–44)
AST: 73 U/L — ABNORMAL HIGH (ref 15–41)
Albumin: 2.7 g/dL — ABNORMAL LOW (ref 3.5–5.0)
Alkaline Phosphatase: 76 U/L (ref 38–126)
Anion gap: 10 (ref 5–15)
BUN: 44 mg/dL — ABNORMAL HIGH (ref 6–20)
CO2: 32 mmol/L (ref 22–32)
Calcium: 8.9 mg/dL (ref 8.9–10.3)
Chloride: 93 mmol/L — ABNORMAL LOW (ref 98–111)
Creatinine, Ser: 0.81 mg/dL (ref 0.61–1.24)
GFR, Estimated: >60 mL/min (ref >60)
Glucose, Bld: 150 mg/dL — ABNORMAL HIGH (ref 70–99)
Potassium: 3.6 mmol/L (ref 3.5–5.1)
Sodium: 135 mmol/L (ref 135–145)
Total Bilirubin: 1.7 mg/dL — ABNORMAL HIGH (ref 0.3–1.2)
Total Protein: 6.1 g/dL — ABNORMAL LOW (ref 6.5–8.1)

## 2020-04-13 LAB — PHOSPHORUS: Phosphorus: 2.8 mg/dL (ref 2.5–4.6)

## 2020-04-13 LAB — MAGNESIUM: Magnesium: 2.2 mg/dL (ref 1.7–2.4)

## 2020-04-13 LAB — C-REACTIVE PROTEIN: CRP: 1.3 mg/dL — ABNORMAL HIGH (ref <1.0)

## 2020-04-13 LAB — TRIGLYCERIDES: Triglycerides: 72 mg/dL (ref <150)

## 2020-04-13 LAB — D-DIMER, QUANTITATIVE: D-Dimer, Quant: 1.48 ug/mL-FEU — ABNORMAL HIGH (ref 0.00–0.50)

## 2020-04-13 LAB — APTT: aPTT: 51 seconds — ABNORMAL HIGH (ref 24–36)

## 2020-04-13 MED ORDER — FUROSEMIDE 10 MG/ML IJ SOLN
40.0000 mg | Freq: Every day | INTRAMUSCULAR | Status: AC
  Administered 2020-04-13 – 2020-04-15 (×3): 40 mg via INTRAVENOUS
  Filled 2020-04-13 (×3): qty 4

## 2020-04-13 MED ORDER — INSULIN ASPART 100 UNIT/ML ~~LOC~~ SOLN
0.0000 [IU] | Freq: Three times a day (TID) | SUBCUTANEOUS | Status: DC
  Administered 2020-04-13: 3 [IU] via SUBCUTANEOUS
  Administered 2020-04-13: 4 [IU] via SUBCUTANEOUS
  Administered 2020-04-13: 3 [IU] via SUBCUTANEOUS
  Administered 2020-04-13: 5 [IU] via SUBCUTANEOUS
  Administered 2020-04-14: 7 [IU] via SUBCUTANEOUS
  Administered 2020-04-14 (×3): 4 [IU] via SUBCUTANEOUS
  Administered 2020-04-15: 7 [IU] via SUBCUTANEOUS
  Administered 2020-04-15 (×2): 4 [IU] via SUBCUTANEOUS
  Administered 2020-04-15: 7 [IU] via SUBCUTANEOUS
  Administered 2020-04-16 – 2020-04-17 (×5): 4 [IU] via SUBCUTANEOUS
  Administered 2020-04-17: 7 [IU] via SUBCUTANEOUS
  Administered 2020-04-17: 4 [IU] via SUBCUTANEOUS
  Administered 2020-04-18: 11 [IU] via SUBCUTANEOUS
  Administered 2020-04-18: 3 [IU] via SUBCUTANEOUS
  Administered 2020-04-19 (×3): 4 [IU] via SUBCUTANEOUS
  Administered 2020-04-19: 3 [IU] via SUBCUTANEOUS
  Administered 2020-04-19 – 2020-04-20 (×2): 4 [IU] via SUBCUTANEOUS
  Administered 2020-04-20 – 2020-04-21 (×5): 3 [IU] via SUBCUTANEOUS
  Administered 2020-04-22 – 2020-04-23 (×4): 4 [IU] via SUBCUTANEOUS
  Filled 2020-04-13 (×35): qty 1

## 2020-04-13 MED ORDER — POLYVINYL ALCOHOL 1.4 % OP SOLN
1.0000 [drp] | OPHTHALMIC | Status: DC | PRN
  Administered 2020-04-13: 1 [drp] via OPHTHALMIC
  Filled 2020-04-13: qty 15

## 2020-04-13 MED ORDER — SODIUM CHLORIDE 0.9 % IV SOLN
INTRAVENOUS | Status: DC | PRN
  Administered 2020-04-13: 250 mL via INTRAVENOUS

## 2020-04-13 MED ORDER — TRAZODONE HCL 50 MG PO TABS
150.0000 mg | ORAL_TABLET | Freq: Every day | ORAL | Status: DC
  Administered 2020-04-13 – 2020-04-27 (×15): 150 mg via ORAL
  Filled 2020-04-13 (×15): qty 1

## 2020-04-13 NOTE — Progress Notes (Signed)
ANTICOAGULATION CONSULT NOTE  Pharmacy Consult for argatroban Indication: atrial fibrillation  No Known Allergies  Patient Measurements: Height: 5\' 9"  (175.3 cm) Weight: (!) 190.5 kg (420 lb) IBW/kg (Calculated) : 70.7 Heparin Dosing Weight: 117 kg  Vital Signs: Temp: 97.8 F (36.6 C) (02/27 0400) Temp Source: Axillary (02/27 0400) BP: 139/88 (02/27 0500) Pulse Rate: 98 (02/27 0500)  Labs: Recent Labs    04/11/20 0516 04/11/20 1253 04/11/20 2138 04/12/20 0350 04/12/20 0917 04/12/20 1513 04/12/20 2026 04/13/20 0417  HGB 12.6*  --   --  12.2*  --   --   --  12.3*  HCT 39.6  --   --  38.3*  --   --   --  38.6*  PLT 130*  --   --  138*  --   --   --  185  APTT 50* 47*   < > 47*   < > 50* 52* 51*  CREATININE 1.00  --   --  0.96  --   --   --  0.81  TROPONINIHS  --  17  --  28*  --   --   --   --    < > = values in this interval not displayed.    Estimated Creatinine Clearance: 164.7 mL/min (by C-G formula based on SCr of 0.81 mg/dL).   Medical History: Past Medical History:  Diagnosis Date  . Allergy   . Anxiety   . Arthritis   . Asthma   . Brain damage   . Chronic pain of both knees 07/13/2018  . Clotting disorder (HCC)   . COPD (chronic obstructive pulmonary disease) (HCC)   . Depression   . GERD (gastroesophageal reflux disease)   . HFrEF (heart failure with reduced ejection fraction) (HCC)    a. 03/2018 Echo: EF 25-30%, diff HK. Mod LAE.  04/2018 History of DVT (deep vein thrombosis)   . History of pulmonary embolism    a. Chronic coumadin.  [name] Hypertension   . MI (myocardial infarction) (HCC)   . Morbid obesity (HCC)   . Neck pain 07/13/2018  . NICM (nonischemic cardiomyopathy) (HCC)    a. s/p Cath x 3 - reportedly nl cors. Last cath 2019 in [place]; b. a. 03/2018 Echo: EF 25-30%, diff HK.  04/2018 Persistent atrial fibrillation (HCC)    a. 03/2018 s/p DCCV; b. CHA2DS2VASc = 1-->Xarelto (later changed to warfarin); c. 05/2018 recurrent afib-->Amio initiated.  . Sleep  apnea   . Sleep apnea     Assessment: 60 year old male presented with SOB. Patient with h/o atrial fibrillation on warfarin PTA admitted with a subtherapeutic INR. Pharmacy was consulted for heparin for afib. Thrombocytopenia on admission noted and slightly worse with bleeding found at multiple sites. LFTs, bilirubin and INR are all noted to be elevated at baseline, however Child-Pugh Score is 5 (class A). HIT antigen and SRA are negative. Will continue argatroban due to heparin shortage. Pt previously on warfarin PTA.   2/26 0350 aPTT 47  2/26 0917 aPTT 46  2/26 1513 aPTT 50 2/26 2026 aPTT 52 2/27 0417 aPTT 51  Goal of Therapy:  aPTT 50 - 90 seconds Monitor platelets by anticoagulation protocol: Yes    Plan:  APTT therapeutic x 2. Will continue argatroban rate of 0.55 mcg/kg/min.   Reheck aPTT with AM labs   Repeat CBC in am  3/27, PharmD, [facility] 04/13/2020 5:34 AM

## 2020-04-13 NOTE — Progress Notes (Signed)
PHARMACY CONSULT NOTE  Pharmacy Consult for Electrolyte Monitoring and Replacement   Recent Labs: Potassium (mmol/L)  Date Value  04/13/2020 3.6   Magnesium (mg/dL)  Date Value  71/07/2692 2.2   Calcium (mg/dL)  Date Value  85/46/2703 8.9   Albumin (g/dL)  Date Value  50/10/3816 2.7 (L)  05/02/2019 3.8   Phosphorus (mg/dL)  Date Value  29/93/7169 2.8   Sodium (mmol/L)  Date Value  04/13/2020 135  06/06/2019 139   Corrected Ca: 89.2 mg/dL  Assessment: 60 y/o male with h/o HTN, MI, COPD, GERD, OSA, morbid obesity, Afib, CHF, hernia repair and lymphedema who is admitted with UTI and COVID 19 currently sedated and ventilated. OGT in place to LIS  Diuretics:  acetazolamide 500 mg per tube x 1 on 2/25.   Nutrition: tube feeds   Goal of Therapy:  Electrolytes WNL  Plan:   No replacement needed today.   Re-check electrolytes in am  Ronnald Ramp ,PharmD Clinical Pharmacist 04/13/2020 8:01 AM

## 2020-04-13 NOTE — Progress Notes (Signed)
Pt blood sugar at 166. Glucometer not sinking yet. Will continue to monitor.

## 2020-04-13 NOTE — Progress Notes (Signed)
Pt was extubated today at 0930, and at this time remains on 2L [place], with O2 staturations at 99%. Pt due to wear CPAP at night. He is alert to self and place, but disoriented to time and situation from most recent assessment.

## 2020-04-13 NOTE — Progress Notes (Signed)
NAME:  [name] [name], MRN:  [id number], DOB:  March 17, 1960, LOS: 7 ADMISSION DATE:  04/06/2020, CONSULTATION DATE:  04/07/2019 REFERRING MD:  Dr. Katrinka Blazing, CHIEF COMPLAINT:  Shortness of Breath; s/p Cardiac Arrest  Brief History:  59. Y.o. Male admitted with Acute on Chronic Respiratory Failure in the setting of COVID-19 Pneumonia and superimposed MRSA Pneumonia. Initially placed on BiPAP.  Suffered Cardiac arrest (asystole) with ROSC obtained in 10 minutes.  History of Present Illness:  Patient is a 60 year old male with supra morbid obesity, obesity hypoventilation syndrome and chronic hypoxic respiratory failure due to the same who presented to [facility] ED due to increased shortness of breath 4 hours prior to admission. His past medical history is as noted below. Per history he had myalgias and cough. He had been on his CPAP prior to presenting to the emergency room but found no relief from it.  Patient was being evaluated in the emergency room and was placed on BiPAP.  However shortly after being placed on BiPAP he became unresponsive and pulseless.  He was noted to be in asystole and received ACLS protocols for approximately 10 minutes until ROSC was obtained.  He was intubated during the procedure.  He is now intubated and mechanically ventilated.  No further history can be obtained.  In the ED laboratory data has no surface patient is COVID 19 positive.  Past Medical History:  Sleep apnea COPD  Atrial fibrillation Nonischemic cardiomyopathy Myocardial infarction HFrEF Hypertension DVT GERD  Depression Arthritis Chronic pain Morbid obesity   Significant Hospital Events:  2/20: Presented to ED; suffered Respiratory/Cardiac arrest (10 minutes of ACLS before ROSC); intubated 2/21: FiO2 weaned to 45%; Diuresis today 2/22: Failed SBT (pt with increased work of breathing and tachycardia 150's); will Diurese again today; Urine culture with Vancomycin Resistant Enterococcus Faecium~placed on  Zyvox 2/23: Plan for SBT, Precedex started to assist with SBT 2/24: Failed SBT; increasing Diuresis to BID; Tracheal aspirate from admission with MRSA PNA 2/25: Diuresed 3L yesterday with increased Lasix ( approx. net even since admit), today with mild metabolic alkalosis, hold lasix and give Diamox, plan for SBT as tolerated  Consults:  Wound Care  Procedures:  2/20: Tracheal intubation 2/20: Right IJ CVC placed  Significant Diagnostic Tests:  2/20: CTA Chest>>1. No obvious large central pulmonary artery embolus identified. 2. Probable trace bilateral pleural effusions. Bilateral lower lobe subsegmental and streaky densities may represent atelectasis or scarring. Infiltrate is not excluded. 3. Aortic Atherosclerosis 2/21: Echocardiogram>>1. Left ventricular ejection fraction, by estimation, is 30 to 35%. Left  ventricular ejection fraction by PLAX is 21 %. The left ventricle has  moderate to severely decreased function. Left ventricular endocardial  border not optimally defined to evaluate  regional wall motion. The left ventricular internal cavity size was  mildly dilated. Left ventricular diastolic parameters were normal.  2. Right ventricular systolic function is normal. The right ventricular  size is mildly enlarged.  3. Left atrial size was mildly dilated.  4. Right atrial size was mildly dilated.  5. The mitral valve was not well visualized. No evidence of mitral valve  regurgitation.  6. The aortic valve was not well visualized. Aortic valve regurgitation  is not visualized.  Micro Data:  2/20: SARS-CoV-2 PCR>> positive 2/20: Influenza A/B PCR>>negative 2/20: Blood culture x2>> 2/20: Urine>>20,000 COLONIES/mL ENTEROCOCCUS FAECIUM  VANCOMYCIN RESISTANT ENTEROCOCCUS ISOLATED  2/21: MRSA PCR>> positive 2/21: Tracheal aspirate>> MRSA  Antimicrobials:  Remdesivir 2/20>>2/24 Ceftriaxone 2/21>>2/22 Linezolid 2/22>>  CC Follow up Resp failure  HPI Off  pressors Off vent Stable oxygen Extubated successfully    Objective   Blood pressure 135/88, pulse (!) 57, temperature 97.8 F (36.6 C), temperature source Axillary, resp. rate 17, height 5\' 9"  (1.753 m), weight (!) 190.5 kg, SpO2 97 %.        Intake/Output Summary (Last 24 hours) at 04/13/2020 0913 Last data filed at 04/13/2020 0900 Gross per 24 hour  Intake 1826.44 ml  Output 3900 ml  Net -2073.56 ml   Filed Weights   04/10/20 0500 04/11/20 0500 04/13/20 0424  Weight: (!) 193.8 kg (!) 193.2 kg (!) 190.5 kg      Review of Systems:  Gen:  Denies  fever, sweats, chills weight loss  HEENT: Denies blurred vision, double vision, ear pain, eye pain, hearing loss, nose bleeds, sore throat Cardiac:  No dizziness, chest pain or heaviness, chest tightness,edema, No JVD Resp:   No cough, -sputum production, -shortness of breath,-wheezing, -hemoptysis,  Gi: Denies swallowing difficulty, stomach pain, nausea or vomiting, diarrhea, constipation, bowel incontinence Other:  All other systems negative   Physical Examination:   General Appearance: No distress  Neuro:without focal findings,  speech normal,  HEENT: PERRLA, EOM intact.   Pulmonary: normal breath sounds, No wheezing.  CardiovascularNormal S1,S2.  No m/r/g.   Abdomen: Benign, Soft, non-tender. Renal:  No costovertebral tenderness  GU:  Not performed at this time. Endoc: No evident thyromegaly Skin:   warm, no rashes, no ecchymosis  Extremities: normal, no cyanosis, clubbing. PSYCHIATRIC: Mood, affect within normal limits.   ALL OTHER ROS ARE NEGATIVE     Assessment & Plan:   Acute on chronic respiratory failure with hypoxia and hypercarbia in the setting of COVID-19 infection, superimposed MRSA Pneumonia, & Pulmonary Vascular Congestion Status post Respiratory Arrest Underlying Obesity with Obesity Hypoventilation syndrome  RESP FAILURE-RESOLVED  Oxygen as needed -IV Steroids (Solumedrol 60 mg  BID) -Follow inflammatory markers: Ferritin, D-dimer, CRP -Vitamin C, zinc -Tracheal aspirate form 2/21 with MRSA -CTA Chest on 2/20 negative for PE -Maintain airborne and contact precautions   Cardiac arrest (asysole, ROSC obtained in 10 minutes), suspect due to Respiratory Arrest Troponin elevation, likely demand ischemia Acute on Chronic HFrEF (LVEF 30-35%) Atrial Fibrillation -Continuous cardiac monitoring -Maintain MAP >65 -Trend Troponin until downtrending (78 ~ 133 ~ 87 ) -Continue Argatroban for anticoagulation  -Echocardiogram pending~ LVEF 30-35%, normal LV diastolic parameters -s/p Diamox lasix   Enterococcus Faecium (Vancomycin Resistant) UTI Superimposed MRSA Pneumonia (pt admitted on 2/20, tracheal aspirate obtained 2/21 with MRSA, DOES NOT meet criteria for HAP/VAP). -Monitor fever curve -Trend WBC's and procalcitonin -Follow cultures as above -Continue  Zyvox   Hyponatremia/hypochloremia due to Volume Overload >>resolved  Super morbid obesity OSA/OHVS  OK to transfer to [facility]    [facility], M.D.  [facility] Leather Pulmonary & Critical Care Medicine  Medical Director [facility] Medical Director [facility] Cardio-Pulmonary Department

## 2020-04-14 DIAGNOSIS — I5023 Acute on chronic systolic (congestive) heart failure: Secondary | ICD-10-CM | POA: Diagnosis not present

## 2020-04-14 DIAGNOSIS — R092 Respiratory arrest: Secondary | ICD-10-CM | POA: Diagnosis not present

## 2020-04-14 DIAGNOSIS — J9601 Acute respiratory failure with hypoxia: Secondary | ICD-10-CM | POA: Diagnosis not present

## 2020-04-14 LAB — CBC WITH DIFFERENTIAL/PLATELET
Abs Immature Granulocytes: 0.55 10*3/uL — ABNORMAL HIGH (ref 0.00–0.07)
Basophils Absolute: 0.1 10*3/uL (ref 0.0–0.1)
Basophils Relative: 1 %
Eosinophils Absolute: 0 10*3/uL (ref 0.0–0.5)
Eosinophils Relative: 0 %
HCT: 40.3 % (ref 39.0–52.0)
Hemoglobin: 13.1 g/dL (ref 13.0–17.0)
Immature Granulocytes: 8 %
Lymphocytes Relative: 7 %
Lymphs Abs: 0.5 10*3/uL — ABNORMAL LOW (ref 0.7–4.0)
MCH: 31.3 pg (ref 26.0–34.0)
MCHC: 32.5 g/dL (ref 30.0–36.0)
MCV: 96.2 fL (ref 80.0–100.0)
Monocytes Absolute: 0.5 10*3/uL (ref 0.1–1.0)
Monocytes Relative: 8 %
Neutro Abs: 5.3 10*3/uL (ref 1.7–7.7)
Neutrophils Relative %: 76 %
Platelets: 187 10*3/uL (ref 150–400)
RBC: 4.19 MIL/uL — ABNORMAL LOW (ref 4.22–5.81)
RDW: 18.3 % — ABNORMAL HIGH (ref 11.5–15.5)
Smear Review: NORMAL
WBC: 7 10*3/uL (ref 4.0–10.5)
nRBC: 0.7 % — ABNORMAL HIGH (ref 0.0–0.2)

## 2020-04-14 LAB — BASIC METABOLIC PANEL
Anion gap: 12 (ref 5–15)
BUN: 37 mg/dL — ABNORMAL HIGH (ref 6–20)
CO2: 32 mmol/L (ref 22–32)
Calcium: 8.9 mg/dL (ref 8.9–10.3)
Chloride: 92 mmol/L — ABNORMAL LOW (ref 98–111)
Creatinine, Ser: 0.8 mg/dL (ref 0.61–1.24)
GFR, Estimated: >60 mL/min (ref >60)
Glucose, Bld: 177 mg/dL — ABNORMAL HIGH (ref 70–99)
Potassium: 4 mmol/L (ref 3.5–5.1)
Sodium: 136 mmol/L (ref 135–145)

## 2020-04-14 LAB — GLUCOSE, CAPILLARY
Glucose-Capillary: 123 mg/dL — ABNORMAL HIGH (ref 70–99)
Glucose-Capillary: 151 mg/dL — ABNORMAL HIGH (ref 70–99)
Glucose-Capillary: 196 mg/dL — ABNORMAL HIGH (ref 70–99)
Glucose-Capillary: 201 mg/dL — ABNORMAL HIGH (ref 70–99)
Glucose-Capillary: 162 mg/dL — ABNORMAL HIGH (ref 70–99)

## 2020-04-14 LAB — APTT: aPTT: 46 seconds — ABNORMAL HIGH (ref 24–36)

## 2020-04-14 LAB — MAGNESIUM: Magnesium: 2 mg/dL (ref 1.7–2.4)

## 2020-04-14 LAB — PHOSPHORUS: Phosphorus: 4.3 mg/dL (ref 2.5–4.6)

## 2020-04-14 LAB — C-REACTIVE PROTEIN: CRP: 3.5 mg/dL — ABNORMAL HIGH (ref <1.0)

## 2020-04-14 LAB — D-DIMER, QUANTITATIVE: D-Dimer, Quant: 0.91 ug/mL-FEU — ABNORMAL HIGH (ref 0.00–0.50)

## 2020-04-14 MED ORDER — LINEZOLID 600 MG PO TABS
600.0000 mg | ORAL_TABLET | Freq: Two times a day (BID) | ORAL | Status: AC
  Administered 2020-04-14 – 2020-04-17 (×7): 600 mg via ORAL
  Filled 2020-04-14 (×7): qty 1

## 2020-04-14 MED ORDER — METOPROLOL TARTRATE 5 MG/5ML IV SOLN
5.0000 mg | Freq: Three times a day (TID) | INTRAVENOUS | Status: DC | PRN
  Administered 2020-04-14: 5 mg via INTRAVENOUS
  Filled 2020-04-14: qty 5

## 2020-04-14 MED ORDER — ARGATROBAN 50 MG/50ML IV SOLN
0.6500 ug/kg/min | INTRAVENOUS | Status: DC
  Filled 2020-04-14: qty 50

## 2020-04-14 MED ORDER — WARFARIN - PHARMACIST DOSING INPATIENT: Freq: Every day | Status: DC

## 2020-04-14 MED ORDER — OXYCODONE-ACETAMINOPHEN 7.5-325 MG PO TABS
1.0000 | ORAL_TABLET | Freq: Four times a day (QID) | ORAL | Status: DC | PRN
  Administered 2020-04-14 – 2020-04-16 (×5): 1 via ORAL
  Filled 2020-04-14 (×5): qty 1

## 2020-04-14 MED ORDER — WARFARIN SODIUM 7.5 MG PO TABS
7.5000 mg | ORAL_TABLET | Freq: Once | ORAL | Status: AC
  Administered 2020-04-14: 7.5 mg via ORAL
  Filled 2020-04-14: qty 1

## 2020-04-14 MED ORDER — ADULT MULTIVITAMIN W/MINERALS CH
1.0000 | ORAL_TABLET | Freq: Every day | ORAL | Status: DC
  Administered 2020-04-15 – 2020-04-28 (×14): 1 via ORAL
  Filled 2020-04-14 (×14): qty 1

## 2020-04-14 MED ORDER — MORPHINE SULFATE (PF) 2 MG/ML IV SOLN: 1.0000 mg | INTRAVENOUS | Status: DC | PRN

## 2020-04-14 NOTE — Progress Notes (Signed)
PHARMACIST - PHYSICIAN COMMUNICATION DR:   Hospitalist CONCERNING: Antibiotic IV to Oral Route Change Policy  RECOMMENDATION: This patient is receiving Linezolid by the intravenous route.  Based on criteria approved by the Pharmacy and Therapeutics Committee, the antibiotic(s) is/are being converted to the equivalent oral dose form(s).   DESCRIPTION: These criteria include:  Patient being treated for a respiratory tract infection, urinary tract infection, cellulitis or clostridium difficile associated diarrhea if on metronidazole  The patient is not neutropenic and does not exhibit a GI malabsorption state  The patient is eating (either orally or via tube) and/or has been taking other orally administered medications for a least 24 hours  The patient is improving clinically and has a Tmax < 100.5  If you have questions about this conversion, please contact the Pharmacy Department  []   [phone] )  ( [phone] [x]   [phone] )  [facility] []   [phone] )  [facility] []   [phone] )  [facility] []   [phone] )    ( [phone], PharmD, BCPS.   Work Cell: [phone] 04/14/2020 2:23 PM

## 2020-04-14 NOTE — Progress Notes (Signed)
PROGRESS NOTE    [name] [name]  OZH:[id number] DOB: 1960-12-25 DOA: 04/06/2020 PCP: [name], DO   Assessment & Plan:   Active Problems:   Acute respiratory failure (HCC)   Respiratory arrest (HCC)   Acute on chronic hypoxic & hypercapnic respiratory failure: secondary to COVID19 pneumonia, superimposed MRSA pneumonia, & pulmonary vascular congestion. Continue on IV steroids and zyvox. CTA chest neg for PE. Continue on supplemental oxygen and wean back to baseline as tolerated. Continue on airborne & contact precautions  Cardiac arrest: w/ asystole. ROSC obtained in 10 mins. Suspect respiratory arrest.   Elevated troponins: likely secondary to demand ischemia  A. fib: likely PAF. D/c argatroban and restart warfarin   Acute on chronic systolic CHF: echo shows EF 30-35%, normal diastolic dysfunction. Monitor I/Os. S/p IV lasix  UTI: secondary to enterococcus faecium. Continue on zyvox  Hyponatremia: resolved  Morbid obesity: BMI 62.0. Complicates overall care and prognosis    DVT prophylaxis: warfarin  Code Status: full  Family Communication:  Disposition Plan: depends on PT/OT   Level of care: Progressive Cardiac   Consultants:     Procedures:    Antimicrobials: zyvox    Subjective: Pt c/o hungry  Objective: Vitals:   04/14/20 0000 04/14/20 0424 04/14/20 0500 04/14/20 0739  BP: (!) 115/92 (!) 141/82    Pulse:  68    Resp:      Temp: 98 F (36.7 C) 97.8 F (36.6 C)    TempSrc: Oral Oral    SpO2: 95% 97%  98%  Weight:   (!) 185.1 kg   Height:        Intake/Output Summary (Last 24 hours) at 04/14/2020 0821 Last data filed at 04/14/2020 0650 Gross per 24 hour  Intake 923.08 ml  Output 4585 ml  Net -3661.92 ml   Filed Weights   04/13/20 0424 04/13/20 1544 04/14/20 0500  Weight: (!) 190.5 kg (!) 186 kg (!) 185.1 kg    Examination:  General exam: Appears calm and comfortable. Morbidly obese Respiratory system: diminished breath  sounds b/l  Cardiovascular system: S1 & S2 +. No  rubs, gallops or clicks. Gastrointestinal system: Abdomen is obese, soft and nontender. Normal bowel sounds heard. Central nervous system: Alert and oriented. Moves all 4 extremities  Psychiatry: Judgement and insight appear normal. Appears frustrated     Data Reviewed: I have personally reviewed following labs and imaging studies  CBC: Recent Labs  Lab 04/10/20 0600 04/11/20 0516 04/12/20 0350 04/13/20 0417 04/14/20 0502  WBC 9.0 8.7 6.7 8.9 7.0  NEUTROABS 6.3 5.8 4.4 4.8 5.3  HGB 12.3* 12.6* 12.2* 12.3* 13.1  HCT 39.5 39.6 38.3* 38.6* 40.3  MCV 97.8 99.0 98.2 97.2 96.2  PLT 108* 130* 138* 185 187   Basic Metabolic Panel: Recent Labs  Lab 04/10/20 0600 04/11/20 0516 04/12/20 0350 04/13/20 0417 04/14/20 0502  NA 137 139 136 135 136  K 4.4 4.2 4.2 3.6 4.0  CL 95* 97* 94* 93* 92*  CO2 30 34* 32 32 32  GLUCOSE 148* 171* 235* 150* 177*  BUN 34* 41* 49* 44* 37*  CREATININE 1.06 1.00 0.96 0.81 0.80  CALCIUM 7.4* 7.7* 8.2* 8.9 8.9  MG 1.9 1.8 2.2 2.2 2.0  PHOS 3.5 4.1 2.2* 2.8 4.3   GFR: Estimated Creatinine Clearance: 161.9 mL/min (by C-G formula based on SCr of 0.8 mg/dL). Liver Function Tests: Recent Labs  Lab 04/09/20 0500 04/10/20 0600 04/11/20 0516 04/12/20 0350 04/13/20 0417  AST 171* 167* 183* 141* 73*  ALT 59* 67* 86* 84* 67*  ALKPHOS 94 84 82 85 76  BILITOT 2.3* 1.9* 2.1* 1.6* 1.7*  PROT 6.8 6.5 6.9 6.4* 6.1*  ALBUMIN 2.6* 2.5* 3.0* 2.7* 2.7*   No results for input(s): LIPASE, AMYLASE in the last 168 hours. No results for input(s): AMMONIA in the last 168 hours. Coagulation Profile: Recent Labs  Lab 04/07/20 2200 04/08/20 0600  INR 4.2* 3.6*   Cardiac Enzymes: No results for input(s): CKTOTAL, CKMB, CKMBINDEX, TROPONINI in the last 168 hours. BNP (last 3 results) No results for input(s): PROBNP in the last 8760 hours. HbA1C: No results for input(s): HGBA1C in the last 72  hours. CBG: Recent Labs  Lab 04/12/20 2307 04/13/20 0351 04/13/20 0813 04/13/20 1700 04/13/20 2051  GLUCAP 145* 128* 130* 183* 166*   Lipid Profile: Recent Labs    04/13/20 0417  TRIG 72   Thyroid Function Tests: No results for input(s): TSH, T4TOTAL, FREET4, T3FREE, THYROIDAB in the last 72 hours. Anemia Panel: No results for input(s): VITAMINB12, FOLATE, FERRITIN, TIBC, IRON, RETICCTPCT in the last 72 hours. Sepsis Labs: Recent Labs  Lab 04/07/20 0947 04/07/20 2248 04/09/20 0500  PROCALCITON 0.42 0.55 0.34    Recent Results (from the past 240 hour(s))  Resp Panel by RT-PCR (Flu A&B, Covid) Nasopharyngeal Swab     Status: Abnormal   Collection Time: 04/06/20 11:21 AM   Specimen: Nasopharyngeal Swab; Nasopharyngeal(NP) swabs in vial transport medium  Result Value Ref Range Status   SARS Coronavirus 2 by RT PCR POSITIVE (A) NEGATIVE Final    Comment: RESULT CALLED TO, READ BACK BY AND VERIFIED WITH: ISABELLA LAPIETRA 04/06/20 AT 1346 BY ACR (NOTE) SARS-CoV-2 target nucleic acids are DETECTED.  The SARS-CoV-2 RNA is generally detectable in upper respiratory specimens during the acute phase of infection. Positive results are indicative of the presence of the identified virus, but do not rule out bacterial infection or co-infection with other pathogens not detected by the test. Clinical correlation with patient history and other diagnostic information is necessary to determine patient infection status. The expected result is Negative.  Fact Sheet for Patients: BloggerCourse.com  Fact Sheet for Healthcare Providers: SeriousBroker.it  This test is not yet approved or cleared by the [place] FDA and  has been authorized for detection and/or diagnosis of SARS-CoV-2 by FDA under an Emergency Use Authorization (EUA).  This EUA will remain in effect (meaning this test  can be used) for the duration of  the COVID-19  declaration under Section 564(b)(1) of the Act, 21 U.S.C. section 360bbb-3(b)(1), unless the authorization is terminated or revoked sooner.     Influenza A by PCR NEGATIVE NEGATIVE Final   Influenza B by PCR NEGATIVE NEGATIVE Final    Comment: (NOTE) The Xpert Xpress SARS-CoV-2/FLU/RSV plus assay is intended as an aid in the diagnosis of influenza from Nasopharyngeal swab specimens and should not be used as a sole basis for treatment. Nasal washings and aspirates are unacceptable for Xpert Xpress SARS-CoV-2/FLU/RSV testing.  Fact Sheet for Patients: BloggerCourse.com  Fact Sheet for Healthcare Providers: SeriousBroker.it  This test is not yet approved or cleared by the [place] FDA and has been authorized for detection and/or diagnosis of SARS-CoV-2 by FDA under an Emergency Use Authorization (EUA). This EUA will remain in effect (meaning this test can be used) for the duration of the COVID-19 declaration under Section 564(b)(1) of the Act, 21 U.S.C. section 360bbb-3(b)(1), unless the authorization is terminated or revoked.  Performed at [facility], [phone]  [address] [place], [place] [address]   Blood culture (routine x 2)     Status: None   Collection Time: 04/06/20 12:10 PM   Specimen: Left Antecubital; Blood  Result Value Ref Range Status   Specimen Description LEFT ANTECUBITAL  Final   Special Requests   Final    BOTTLES DRAWN AEROBIC AND ANAEROBIC Blood Culture adequate volume   Culture   Final    NO GROWTH 5 DAYS Performed at [facility], [address] [place], [place] [address]    Report Status 04/11/2020 FINAL  Final  Urine culture     Status: Abnormal   Collection Time: 04/06/20 12:13 PM   Specimen: In/Out Cath Urine  Result Value Ref Range Status   Specimen Description   Final    IN/OUT CATH URINE Performed at [facility], [address], [place], [place] [address]     Special Requests   Final    NONE Performed at [facility], [address] [place], [place] [address]    Culture (A)  Final    20,000 COLONIES/mL ENTEROCOCCUS FAECIUM VANCOMYCIN RESISTANT ENTEROCOCCUS ISOLATED    Report Status 04/08/2020 FINAL  Final   Organism ID, Bacteria ENTEROCOCCUS FAECIUM (A)  Final      Susceptibility   Enterococcus faecium - MIC*    AMPICILLIN >=32 RESISTANT Resistant     NITROFURANTOIN 64 INTERMEDIATE Intermediate     VANCOMYCIN >=32 RESISTANT Resistant     LINEZOLID 2 SENSITIVE Sensitive     * 20,000 COLONIES/mL ENTEROCOCCUS FAECIUM  Blood culture (routine x 2)     Status: None   Collection Time: 04/06/20 12:15 PM   Specimen: BLOOD LEFT FOREARM  Result Value Ref Range Status   Specimen Description BLOOD LEFT FOREARM  Final   Special Requests   Final    BOTTLES DRAWN AEROBIC AND ANAEROBIC Blood Culture results may not be optimal due to an excessive volume of blood received in culture bottles   Culture   Final    NO GROWTH 5 DAYS Performed at [facility], [address] [place], [place] [address]    Report Status 04/11/2020 FINAL  Final  MRSA PCR Screening     Status: Abnormal   Collection Time: 04/07/20  9:47 AM   Specimen: Nasal Mucosa; Nasopharyngeal  Result Value Ref Range Status   MRSA by PCR POSITIVE (A) NEGATIVE Final    Comment:        The GeneXpert MRSA Assay (FDA approved for NASAL specimens only), is one component of a comprehensive MRSA colonization surveillance program. It is not intended to diagnose MRSA infection nor to guide or monitor treatment for MRSA infections. RESULT CALLED TO, READ BACK BY AND VERIFIED WITH:  [facility] FIELDS AT 1132 04/07/20 SDR Performed at [facility], [address] [place], [place] [address]   Culture, Respiratory w Gram Stain     Status: None   Collection Time: 04/07/20 11:39 AM   Specimen: Tracheal Aspirate; Respiratory  Result Value Ref Range Status   Specimen  Description   Final    TRACHEAL ASPIRATE Performed at [facility], [address], [place], [place] [address]    Special Requests   Final    NONE Performed at [facility], [address] [place], [place] [address]    Gram Stain   Final    ABUNDANT WBC PRESENT, PREDOMINANTLY PMN FEW GRAM POSITIVE COCCI Performed at [facility], [address], [place],  [place] [address]    Culture RARE METHICILLIN RESISTANT STAPHYLOCOCCUS AUREUS  Final   Report Status 04/10/2020 FINAL  Final   Organism ID, Bacteria METHICILLIN RESISTANT STAPHYLOCOCCUS AUREUS  Final      Susceptibility   Methicillin resistant staphylococcus aureus - MIC*    CIPROFLOXACIN 4 RESISTANT Resistant     ERYTHROMYCIN >=8 RESISTANT Resistant     GENTAMICIN <=0.5 SENSITIVE Sensitive     OXACILLIN >=4 RESISTANT Resistant     TETRACYCLINE <=1 SENSITIVE Sensitive     VANCOMYCIN 1 SENSITIVE Sensitive     TRIMETH/SULFA <=10 SENSITIVE Sensitive     CLINDAMYCIN <=0.25 SENSITIVE Sensitive     RIFAMPIN <=0.5 SENSITIVE Sensitive     Inducible Clindamycin NEGATIVE Sensitive     * RARE METHICILLIN RESISTANT STAPHYLOCOCCUS AUREUS         Radiology Studies: DG Chest Port 1 View  Result Date: 04/13/2020 CLINICAL DATA:  60 year old male with history of shortness of breath. EXAM: PORTABLE CHEST 1 VIEW COMPARISON:  Chest x-ray 04/12/2020. FINDINGS: There is a right-sided internal jugular central venous catheter with tip terminating in the mid superior vena cava. Patient has been extubated. Nasogastric tube has been removed. Lung volumes are low. Persistent bibasilar opacities (left greater than right), which may reflect areas of atelectasis and/or consolidation. Small left pleural effusion. No definite right pleural effusion. Pulmonary venous congestion, without frank pulmonary edema. Heart size is mildly enlarged. Upper mediastinal contours are within normal limits. IMPRESSION: 1. Support apparatus, as above.  2. Cardiomegaly with pulmonary venous congestion. 3. Improving aeration in the lung bases with persistent atelectasis and/or consolidation bilaterally (left greater than right), and small left pleural effusion. Electronically Signed   By: Trudie Reed M.D.   On: 04/13/2020 07:14        Scheduled Meds: . Chlorhexidine Gluconate Cloth  6 each Topical Daily  . furosemide  40 mg Intravenous Daily  . hydrocerin   Topical Daily  . insulin aspart  0-20 Units Subcutaneous TID PC & HS  . Ipratropium-Albuterol  1 puff Inhalation Q6H  . levothyroxine  75 mcg Oral Q0600  . mouth rinse  15 mL Mouth Rinse BID  . methylPREDNISolone (SOLU-MEDROL) injection  60 mg Intravenous Q12H  . multivitamin  15 mL Per Tube Daily  . traZODone  150 mg Oral QHS   Continuous Infusions: . sodium chloride 250 mL (04/13/20 2057)  . argatroban 0.65 mcg/kg/min (04/14/20 0630)  . famotidine (PEPCID) IV Stopped (04/13/20 2250)  . linezolid (ZYVOX) IV 600 mg (04/13/20 2102)     LOS: 8 days    Time spent: 33 mins     Charise Killian, MD [facility] Pager [phone]  If 7PM-7AM, please contact night-coverage 04/14/2020, 8:21 AM

## 2020-04-14 NOTE — Plan of Care (Signed)

## 2020-04-14 NOTE — Progress Notes (Signed)
PHARMACY CONSULT NOTE  Pharmacy Consult for Electrolyte Monitoring and Replacement   Recent Labs: Potassium (mmol/L)  Date Value  04/14/2020 4.0   Magnesium (mg/dL)  Date Value  61/44/3154 2.0   Calcium (mg/dL)  Date Value  00/86/7619 8.9   Albumin (g/dL)  Date Value  50/93/2671 2.7 (L)  05/02/2019 3.8   Phosphorus (mg/dL)  Date Value  24/58/0998 4.3   Sodium (mmol/L)  Date Value  04/14/2020 136  06/06/2019 139   Corrected Ca: 89.2 mg/dL  Assessment: 60 y/o male with h/o HTN, MI, COPD, GERD, OSA, morbid obesity, Afib, CHF, hernia repair and lymphedema who is admitted with UTI and COVID 19 currently sedated and ventilated. OGT in place to LIS  Diuretics:  acetazolamide 500 mg per tube x 1 on 2/25.   Nutrition: tube feeds   Goal of Therapy:  Electrolytes WNL  Plan:   No replacement needed today. Pharmacy will sign off as patient transferred out of the ICU.   Re-check electrolytes in am  Ronnald Ramp ,PharmD Clinical Pharmacist 04/14/2020 8:23 AM

## 2020-04-14 NOTE — Evaluation (Signed)
Occupational Therapy Evaluation Patient Details Name: [name] [name] MRN: [id number] DOB: [birth year] Today's Date: 04/14/2020    History of Present Illness Patient is a 60 year old male with supra morbid obesity, obesity hypoventilation syndrome and chronic hypoxic respiratory failure due to the same who presented to [facility] ED on 04/06/2020 due to increased shortness of breath 4 hours prior to admission. He had been on his CPAP prior to presenting to the emergency room but found no relief from it. Patient was being evaluated in the emergency room and was placed on BiPAP. However shortly after being placed on BiPAP he became unresponsive and pulseless.  He was noted to be in asystole and received ACLS protocols for approximately 10 minutes until ROSC was obtained. He was intubated during the procedure, and transfered to ICU intubated and mechanically ventilated.   Clinical Impression   When therapist entered the room, pt was sideways on bed, feet dangling over the edge, and unable to move himself in either direction. He apparently had attempted to transfer on his own from bed to chair, but was unable to support his body weight and could not get himself back into bed. Therapist called for assistance, given that this pt is +3 for transfer. With nurse and second therapist in room, pt maneuvered back into bed, with Total A +3. Pt required Total A +1 for further bed mobility/repositioning. Pt was agitated, yelling, and angry throughout session, and with HR rising into 120s. Prior to this hospitalization, pt lived in an apt with a roommate and has an attendant 3 days/week to assist him with toileting, bathing, cooking, shopping. Pt is unable to perform LB dressing, instead wears slip-on shoes, no socks. He states that he can transfer to toilet only on days when attemdant is at his home, otherwise he uses a urinal. Pt reports he has had "at least 6" falls in the previous 6 months, stating that "my knees give out on  me." Pt is very debilitated and weak at present. Recommend ongoing OT while hospitalized to improve safety, strength, and functional mobility. Upon hospital discharge, recommend STR to maximize pt safety and return to PLOF.     Follow Up Recommendations  SNF    Equipment Recommendations       Recommendations for Other Services       Precautions / Restrictions Precautions Precautions: Fall Restrictions Weight Bearing Restrictions: No      Mobility Bed Mobility Overal bed mobility: Needs Assistance Bed Mobility: Rolling;Sit to Supine Rolling: +2 for physical assistance;+2 for safety/equipment;Total assist     Sit to supine: +2 for physical assistance;+2 for safety/equipment;Total assist        Transfers                 General transfer comment: unable to attempt transfer    Balance Overall balance assessment: History of Falls                                         ADL either performed or assessed with clinical judgement   ADL Overall ADL's : Needs assistance/impaired Eating/Feeding: Independent                                           Vision Patient Visual Report: No change from baseline  Perception     Praxis      Pertinent Vitals/Pain Pain Assessment: No/denies pain     Hand Dominance     Extremity/Trunk Assessment Upper Extremity Assessment Upper Extremity Assessment: Generalized weakness   Lower Extremity Assessment Lower Extremity Assessment: Generalized weakness       Communication Communication Communication: No difficulties   Cognition Arousal/Alertness: Awake/alert Behavior During Therapy: Agitated;Anxious;Impulsive;Restless Overall Cognitive Status: Difficult to assess                                 General Comments: Pt agitated, demanding, verbally abusive to hospital staff   General Comments       Exercises Other Exercises Other Exercises: educ pt re: safety,  need to use call ball before attempting OOB transfer.   Shoulder Instructions      Home Living Family/patient expects to be discharged to:: Private residence Living Arrangements: Non-relatives/Friends Available Help at Discharge: Available PRN/intermittently (caretaker comes in 3 x/week, 2-4 hrs/day, to assist with ADL/IADL) Type of Home: House Home Access: Ramped entrance     Home Layout: One level     Bathroom Shower/Tub: Producer, television/film/video: Standard     Home Equipment: Environmental consultant - 4 wheels;Electric scooter;Shower seat;Hand held shower head;Wheelchair - power   Additional Comments: Uses 4ww in home (short distances) and electric w/c for longer distance      Prior Functioning/Environment Level of Independence: Needs assistance    ADL's / Homemaking Assistance Needed: caretaker assists with bathing, shopping, cooking            OT Problem List: Decreased safety awareness;Impaired balance (sitting and/or standing);Decreased activity tolerance;Cardiopulmonary status limiting activity;Decreased knowledge of precautions;Decreased range of motion;Decreased strength;Decreased knowledge of use of DME or AE;Increased edema;Obesity      OT Treatment/Interventions: Self-care/ADL training;Therapeutic exercise;Patient/family education;Balance training;Therapeutic activities;DME and/or AE instruction    OT Goals(Current goals can be found in the care plan section) Acute Rehab OT Goals Patient Stated Goal: to be able to get out of bed on his own OT Goal Formulation: With patient Time For Goal Achievement: 04/28/20 Potential to Achieve Goals: Fair ADL Goals Pt Will Perform Grooming: with min guard assist;sitting Pt Will Perform Upper Body Dressing: with min guard assist;sitting Additional ADL Goal #1: Pt will safely transfer from bed to chair, using LRAD, with Min A  OT Frequency: Min 1X/week   Barriers to D/C: Decreased caregiver support          Co-evaluation               AM-PAC OT "6 Clicks" Daily Activity     Outcome Measure Help from another person eating meals?: None Help from another person taking care of personal grooming?: A Little Help from another person toileting, which includes using toliet, bedpan, or urinal?: Total Help from another person bathing (including washing, rinsing, drying)?: Total Help from another person to put on and taking off regular upper body clothing?: A Lot Help from another person to put on and taking off regular lower body clothing?: Total 6 Click Score: 12   End of Session    Activity Tolerance: Treatment limited secondary to medical complications (Comment) Patient left: in bed;with call bell/phone within reach;with bed alarm set  OT Visit Diagnosis: Unsteadiness on feet (R26.81);Repeated falls (R29.6);Muscle weakness (generalized) (M62.81)                Time: 7867-5449 OT Time Calculation (min): 41  min Charges:  OT General Charges $OT Visit: 1 Visit OT Evaluation $OT Eval Moderate Complexity: 1 Mod OT Treatments $Self Care/Home Management : 38-52 mins  Latina Craver, PhD, MS, OTR/L ascom [phone] 04/14/20, 1:06 PM

## 2020-04-14 NOTE — Plan of Care (Signed)

## 2020-04-14 NOTE — Progress Notes (Signed)
Patient asked to get up this morning RN attempted to ambulate pt, pt was barely able to sit to side of the bed and required complete assist. Pt HR increased to 160's educated pt that I would not be able to stand him on my own. OT came to bedside again we attempted to stand pt, unable to stand him. Pt said if we did not pull him up he would throw him self in the floor. Took three assist to move pt from edge of the bed to laying back. Informed doctor williams

## 2020-04-14 NOTE — Progress Notes (Signed)
ANTICOAGULATION CONSULT NOTE  Pharmacy Consult for argatroban Indication: atrial fibrillation  No Known Allergies  Patient Measurements: Height: 5\' 8"  (172.7 cm) Weight: (!) 185.1 kg (408 lb) IBW/kg (Calculated) : 68.4 Heparin Dosing Weight: 117 kg  Vital Signs: Temp: 97.8 F (36.6 C) (02/28 0424) Temp Source: Oral (02/28 0424) BP: 141/82 (02/28 0424) Pulse Rate: 68 (02/28 0424)  Labs: Recent Labs    04/11/20 1253 04/11/20 2138 04/12/20 0350 04/12/20 0917 04/12/20 2026 04/13/20 0417 04/14/20 0502  HGB  --    < > 12.2*  --   --  12.3* 13.1  HCT  --   --  38.3*  --   --  38.6* 40.3  PLT  --   --  138*  --   --  185 187  APTT 47*   < > 47*   < > 52* 51* 46*  CREATININE  --   --  0.96  --   --  0.81 0.80  TROPONINIHS 17  --  28*  --   --   --   --    < > = values in this interval not displayed.    Estimated Creatinine Clearance: 161.9 mL/min (by C-G formula based on SCr of 0.8 mg/dL).   Medical History: Past Medical History:  Diagnosis Date  . Allergy   . Anxiety   . Arthritis   . Asthma   . Brain damage   . Chronic pain of both knees 07/13/2018  . Clotting disorder (HCC)   . COPD (chronic obstructive pulmonary disease) (HCC)   . Depression   . GERD (gastroesophageal reflux disease)   . HFrEF (heart failure with reduced ejection fraction) (HCC)    a. 03/2018 Echo: EF 25-30%, diff HK. Mod LAE.  04/2018 History of DVT (deep vein thrombosis)   . History of pulmonary embolism    a. Chronic coumadin.  [name] Hypertension   . MI (myocardial infarction) (HCC)   . Morbid obesity (HCC)   . Neck pain 07/13/2018  . NICM (nonischemic cardiomyopathy) (HCC)    a. s/p Cath x 3 - reportedly nl cors. Last cath 2019 in [place]; b. a. 03/2018 Echo: EF 25-30%, diff HK.  04/2018 Persistent atrial fibrillation (HCC)    a. 03/2018 s/p DCCV; b. CHA2DS2VASc = 1-->Xarelto (later changed to warfarin); c. 05/2018 recurrent afib-->Amio initiated.  . Sleep apnea   . Sleep apnea     Assessment: 60 year old  male presented with SOB. Patient with h/o atrial fibrillation on warfarin PTA admitted with a subtherapeutic INR. Pharmacy was consulted for heparin for afib. Thrombocytopenia on admission noted and slightly worse with bleeding found at multiple sites. LFTs, bilirubin and INR are all noted to be elevated at baseline, however Child-Pugh Score is 5 (class A). HIT antigen and SRA are negative. Will continue argatroban due to heparin shortage. Pt previously on warfarin PTA.   2/26 0350 aPTT 47  2/26 0917 aPTT 46  2/26 1513 aPTT 50 2/26 2026 aPTT 52 2/27 0417 aPTT 51 2/28 0502 aPTT 46  Goal of Therapy:  aPTT 50 - 90 seconds Monitor platelets by anticoagulation protocol: Yes    Plan:  APTT subtherapeutic. Will increase argatroban rate to 0.65 mcg/kg/min.   Reheck aPTT in 4 hours, then daily   Repeat CBC in am  3/28, PharmD, [facility] 04/14/2020 6:12 AM

## 2020-04-14 NOTE — Progress Notes (Signed)
PT Cancellation Note  Patient Details Name: [name] [name] MRN: [id number] DOB: August 03, 1960   Cancelled Treatment:    Reason Eval/Treat Not Completed: Other (comment). Pt R sidelying in bed upon PT entrance. Time spent discussing pt's current status and mobility, as well as attempts this AM. Pt reported he was very frustrated and angry by his weakness. Also stated he was too tired to try again today, but was willing to try tomorrow. PT to re-attempt as able.   Olga Coaster PT, DPT 3:57 PM,04/14/20

## 2020-04-14 NOTE — Progress Notes (Signed)
ANTICOAGULATION CONSULT NOTE  Pharmacy Consult for warfarin Indication: atrial fibrillation  No Known Allergies  Patient Measurements: Height: 5\' 8"  (172.7 cm) Weight: (!) 185.1 kg (408 lb) IBW/kg (Calculated) : 68.4 Heparin Dosing Weight: 117 kg  Vital Signs: Temp: 98.9 F (37.2 C) (02/28 0800) Temp Source: Oral (02/28 0800) BP: 156/135 (02/28 0800) Pulse Rate: 88 (02/28 0800)  Labs: Recent Labs    04/11/20 1253 04/11/20 2138 04/12/20 0350 04/12/20 0917 04/12/20 2026 04/13/20 0417 04/14/20 0502  HGB  --    < > 12.2*  --   --  12.3* 13.1  HCT  --   --  38.3*  --   --  38.6* 40.3  PLT  --   --  138*  --   --  185 187  APTT 47*   < > 47*   < > 52* 51* 46*  CREATININE  --   --  0.96  --   --  0.81 0.80  TROPONINIHS 17  --  28*  --   --   --   --    < > = values in this interval not displayed.    Estimated Creatinine Clearance: 161.9 mL/min (by C-G formula based on SCr of 0.8 mg/dL).   Medical History: Past Medical History:  Diagnosis Date  . Allergy   . Anxiety   . Arthritis   . Asthma   . Brain damage   . Chronic pain of both knees 07/13/2018  . Clotting disorder (HCC)   . COPD (chronic obstructive pulmonary disease) (HCC)   . Depression   . GERD (gastroesophageal reflux disease)   . HFrEF (heart failure with reduced ejection fraction) (HCC)    a. 03/2018 Echo: EF 25-30%, diff HK. Mod LAE.  04/2018 History of DVT (deep vein thrombosis)   . History of pulmonary embolism    a. Chronic coumadin.  [name] Hypertension   . MI (myocardial infarction) (HCC)   . Morbid obesity (HCC)   . Neck pain 07/13/2018  . NICM (nonischemic cardiomyopathy) (HCC)    a. s/p Cath x 3 - reportedly nl cors. Last cath 2019 in [place]; b. a. 03/2018 Echo: EF 25-30%, diff HK.  04/2018 Persistent atrial fibrillation (HCC)    a. 03/2018 s/p DCCV; b. CHA2DS2VASc = 1-->Xarelto (later changed to warfarin); c. 05/2018 recurrent afib-->Amio initiated.  . Sleep apnea   . Sleep apnea     Assessment: 60 year old  male presented with SOB. Patient with h/o atrial fibrillation on warfarin PTA admitted with a subtherapeutic INR. Pharmacy was consulted for heparin for afib. Thrombocytopenia on admission but improved. HIT antigen and SRA is negative. INR is 3.6 - elevated due to the argatroban. Once its stopped INR should normalize. Pt was subtherapeutic on admission at 1.6. Home regimen for patient may not be appropriate. CBC stable. No need to bridge - pt at low risk. Pt has a hx of DVT in 2020.    Home dose warfarin: 5 mg daily.   Date INR Warfarin Dose  2/28 3.6 Argatroban + warfarin 7.5 mg          Goal of Therapy:  INR 2 -3  Monitor platelets by anticoagulation protocol: Yes    Plan:  APTT discontinued. INR is supratherapeutic due to the argatroban INR should correct, once washed out. Will give warfarin 7.5 mg x 1. Higher dose from home regimen as I do not believe 5 mg daily is an appropriate regimen for this patient due his body weight.  Pt has come to the ED multiple time with subtherapeutic INR and warfarin dose has not been adjusted. Daily INR. CBC at least every 3 days.   Paschal Dopp, PharmD, BCPS 04/14/2020 10:42 AM

## 2020-04-14 NOTE — Progress Notes (Signed)
Nutrition Follow Up Note   DOCUMENTATION CODES:   Morbid obesity  INTERVENTION:   Ensure Enlive po TID, each supplement provides 350 kcal and 20 grams of protein  MVI po daily   NUTRITION DIAGNOSIS:   Inadequate oral intake related to inability to eat (pt sedated and ventilated) as evidenced by NPO status.  GOAL:   Patient will meet greater than or equal to 90% of their needs  -met with tube feeds  MONITOR:   PO intake,Supplement acceptance,Labs,Weight trends,Skin,I & O's  ASSESSMENT:   60 y/o male with h/o HTN, MI, COPD, GERD, OSA, morbid obesity, Afib, CHF, hernia repair and lymphedema who is admitted with UTI and COVID 19   Pt extubated 2/26. Pt initiated on a soft diet today. RD will add supplements to help pt meet his estimated needs. Per chart, pt is fairly weight stable since admit.   Medications reviewed and include: lasix, insulin, synthroid, solu-medrol, MVI, warfarin   Labs reviewed: K 4.0 wnl, BUN 37(H), P 4.3 wnl, Mg 2.0 wnl cbgs- 151, 196 x 24 hrs  Diet Order:   Diet Order            DIET SOFT Room service appropriate? Yes; Fluid consistency: Thin  Diet effective now                EDUCATION NEEDS:   No education needs have been identified at this time  Skin:  Skin Assessment: Reviewed RN Assessment (Stage III R buttocks)  Last BM:  2/28  Height:   Ht Readings from Last 1 Encounters:  04/13/20 5' 8"  (1.727 m)    Weight:   Wt Readings from Last 1 Encounters:  04/14/20 (!) 185.1 kg    Ideal Body Weight:  72.7 kg  BMI:  Body mass index is 62.04 kg/m.  Estimated Nutritional Needs:   Kcal:  3200-3500kcal/day  Protein:  >150g/day  Fluid:  2.2-2.5L/day  Koleen Distance MS, RD, LDN Please refer to [facility] for RD and/or RD on-call/weekend/after hours pager

## 2020-04-15 ENCOUNTER — Encounter: Payer: Medicaid Other | Admitting: Occupational Therapy

## 2020-04-15 DIAGNOSIS — I5023 Acute on chronic systolic (congestive) heart failure: Secondary | ICD-10-CM | POA: Diagnosis not present

## 2020-04-15 DIAGNOSIS — J9601 Acute respiratory failure with hypoxia: Secondary | ICD-10-CM | POA: Diagnosis not present

## 2020-04-15 DIAGNOSIS — R092 Respiratory arrest: Secondary | ICD-10-CM | POA: Diagnosis not present

## 2020-04-15 LAB — PHOSPHORUS: Phosphorus: 3.9 mg/dL (ref 2.5–4.6)

## 2020-04-15 LAB — CBC WITH DIFFERENTIAL/PLATELET
Abs Immature Granulocytes: 0.3 10*3/uL — ABNORMAL HIGH (ref 0.00–0.07)
Basophils Absolute: 0 10*3/uL (ref 0.0–0.1)
Basophils Relative: 0 %
Eosinophils Absolute: 0 10*3/uL (ref 0.0–0.5)
Eosinophils Relative: 0 %
HCT: 37.9 % — ABNORMAL LOW (ref 39.0–52.0)
Hemoglobin: 12.4 g/dL — ABNORMAL LOW (ref 13.0–17.0)
Immature Granulocytes: 3 %
Lymphocytes Relative: 4 %
Lymphs Abs: 0.4 10*3/uL — ABNORMAL LOW (ref 0.7–4.0)
MCH: 31.5 pg (ref 26.0–34.0)
MCHC: 32.7 g/dL (ref 30.0–36.0)
MCV: 96.2 fL (ref 80.0–100.0)
Monocytes Absolute: 0.9 10*3/uL (ref 0.1–1.0)
Monocytes Relative: 9 %
Neutro Abs: 8.7 10*3/uL — ABNORMAL HIGH (ref 1.7–7.7)
Neutrophils Relative %: 84 %
Platelets: 210 10*3/uL (ref 150–400)
RBC: 3.94 MIL/uL — ABNORMAL LOW (ref 4.22–5.81)
RDW: 18.2 % — ABNORMAL HIGH (ref 11.5–15.5)
WBC: 10.4 10*3/uL (ref 4.0–10.5)
nRBC: 0.3 % — ABNORMAL HIGH (ref 0.0–0.2)

## 2020-04-15 LAB — PROTIME-INR
INR: 1.2 (ref 0.8–1.2)
Prothrombin Time: 15.2 seconds (ref 11.4–15.2)

## 2020-04-15 LAB — GLUCOSE, CAPILLARY
Glucose-Capillary: 176 mg/dL — ABNORMAL HIGH (ref 70–99)
Glucose-Capillary: 191 mg/dL — ABNORMAL HIGH (ref 70–99)
Glucose-Capillary: 202 mg/dL — ABNORMAL HIGH (ref 70–99)
Glucose-Capillary: 208 mg/dL — ABNORMAL HIGH (ref 70–99)

## 2020-04-15 LAB — D-DIMER, QUANTITATIVE: D-Dimer, Quant: 1.07 ug/mL-FEU — ABNORMAL HIGH (ref 0.00–0.50)

## 2020-04-15 LAB — HEMOGLOBIN A1C
Hgb A1c MFr Bld: 6.6 % — ABNORMAL HIGH (ref 4.8–5.6)
Mean Plasma Glucose: 142.72 mg/dL

## 2020-04-15 LAB — MAGNESIUM: Magnesium: 2.2 mg/dL (ref 1.7–2.4)

## 2020-04-15 LAB — C-REACTIVE PROTEIN: CRP: 1.7 mg/dL — ABNORMAL HIGH (ref <1.0)

## 2020-04-15 MED ORDER — METHYLPREDNISOLONE SODIUM SUCC 40 MG IJ SOLR
40.0000 mg | Freq: Two times a day (BID) | INTRAMUSCULAR | Status: AC
  Administered 2020-04-15 – 2020-04-19 (×7): 40 mg via INTRAVENOUS
  Filled 2020-04-15 (×7): qty 1

## 2020-04-15 MED ORDER — METOPROLOL TARTRATE 25 MG PO TABS
25.0000 mg | ORAL_TABLET | Freq: Two times a day (BID) | ORAL | Status: AC
  Administered 2020-04-15 – 2020-04-23 (×18): 25 mg via ORAL
  Filled 2020-04-15 (×18): qty 1

## 2020-04-15 MED ORDER — LISINOPRIL 2.5 MG PO TABS
2.5000 mg | ORAL_TABLET | Freq: Every day | ORAL | Status: DC
  Administered 2020-04-15 – 2020-04-21 (×7): 2.5 mg via ORAL
  Filled 2020-04-15 (×8): qty 1

## 2020-04-15 MED ORDER — WARFARIN SODIUM 7.5 MG PO TABS
7.5000 mg | ORAL_TABLET | Freq: Once | ORAL | Status: AC
  Administered 2020-04-15: 7.5 mg via ORAL
  Filled 2020-04-15: qty 1

## 2020-04-15 MED ORDER — FUROSEMIDE 10 MG/ML IJ SOLN
40.0000 mg | Freq: Every day | INTRAMUSCULAR | Status: DC
  Administered 2020-04-16 – 2020-04-19 (×4): 40 mg via INTRAVENOUS
  Filled 2020-04-15 (×4): qty 4

## 2020-04-15 MED ORDER — GUAIFENESIN ER 600 MG PO TB12
1200.0000 mg | ORAL_TABLET | Freq: Two times a day (BID) | ORAL | Status: DC
  Administered 2020-04-15 – 2020-04-28 (×27): 1200 mg via ORAL
  Filled 2020-04-15 (×27): qty 2

## 2020-04-15 MED ORDER — ASPIRIN 81 MG PO CHEW
81.0000 mg | CHEWABLE_TABLET | Freq: Every day | ORAL | Status: DC
  Administered 2020-04-16 – 2020-04-28 (×13): 81 mg via ORAL
  Filled 2020-04-15 (×13): qty 1

## 2020-04-15 MED ORDER — MONTELUKAST SODIUM 10 MG PO TABS
10.0000 mg | ORAL_TABLET | Freq: Every day | ORAL | Status: DC
  Administered 2020-04-15 – 2020-04-27 (×13): 10 mg via ORAL
  Filled 2020-04-15 (×13): qty 1

## 2020-04-15 MED ORDER — ENOXAPARIN SODIUM 300 MG/3ML IJ SOLN
185.0000 mg | Freq: Two times a day (BID) | INTRAMUSCULAR | Status: DC
  Administered 2020-04-15 – 2020-04-17 (×4): 185 mg via SUBCUTANEOUS
  Filled 2020-04-15 (×5): qty 1.85

## 2020-04-15 MED ORDER — GUAIFENESIN-CODEINE 100-10 MG/5ML PO SOLN
10.0000 mL | ORAL | Status: DC | PRN
  Administered 2020-04-15 – 2020-04-24 (×7): 10 mL via ORAL
  Filled 2020-04-15 (×7): qty 10

## 2020-04-15 MED ORDER — ATORVASTATIN CALCIUM 20 MG PO TABS
40.0000 mg | ORAL_TABLET | Freq: Every day | ORAL | Status: DC
  Administered 2020-04-16 – 2020-04-28 (×13): 40 mg via ORAL
  Filled 2020-04-15 (×13): qty 2

## 2020-04-15 MED ORDER — DULOXETINE HCL 20 MG PO CPEP
20.0000 mg | ORAL_CAPSULE | Freq: Every day | ORAL | Status: DC
  Administered 2020-04-16 – 2020-04-28 (×13): 20 mg via ORAL
  Filled 2020-04-15 (×14): qty 1

## 2020-04-15 MED ORDER — GABAPENTIN 300 MG PO CAPS
300.0000 mg | ORAL_CAPSULE | Freq: Three times a day (TID) | ORAL | Status: DC
  Administered 2020-04-15 – 2020-04-20 (×15): 300 mg via ORAL
  Filled 2020-04-15 (×18): qty 1

## 2020-04-15 NOTE — Progress Notes (Signed)
Patient has been caught holding pills in mouth and not swallowing them first time was just morning med he spit back into cup when nurse left room, they were found by PT he told her he dropped them, pt was handed back medication pt noted that he held them in his mouth and did not swallow. Pt called out for pain medication nurse administered afternoon meds with the narcotic, noticed pt did not swallow med's when given, had to prompt pt multiple times and check mouth to make sure med's were take.[name]

## 2020-04-15 NOTE — Evaluation (Signed)
Physical Therapy Evaluation Patient Details Name: [name] [name] MRN: [id number] DOB: [birth year] Today's Date: 04/15/2020   History of Present Illness  Patient is a 60 year old male with supra morbid obesity, obesity hypoventilation syndrome and chronic hypoxic respiratory failure due to the same who presented to [facility] ED on 04/06/2020 due to increased shortness of breath 4 hours prior to admission. He had been on his CPAP prior to presenting to the emergency room but found no relief from it. Patient was being evaluated in the emergency room and was placed on BiPAP. However shortly after being placed on BiPAP he became unresponsive and pulseless.  He was noted to be in asystole and received ACLS protocols for approximately 10 minutes until ROSC was obtained. He was intubated during the procedure, and transfered to ICU intubated and mechanically ventilated.    Clinical Impression  Patient A&Ox4, reported generalized body aches, stated he had pain medicine at bedside. (Of note, pt pretended to take the medicine, but PT discovered it in patient hand later, RN notified). He reported at baseline he lives with his sister. He is able to stand pivot to his power chair with his walker.   The patient demonstrated generalized weakness of UEs and LEs, RLE weaker than LLE. Able to participate with several supine exercises for strengthening. Supine to sit with heavy use of bed rails and modA to assist with trunk elevation, but pt able to reposition at EOB with CGA. Fair balance noted but pt maintained bilateral UE support throughout. Sit <> stand attempted once with RW and minA from EOB, pt unable to tolerate upright positioning >10seconds and returned to sitting. Pt became agitated with PT about wanting to transfer to the chair without enough assistance. PT provided education about safety concerns, elevated HR, and potential use of hoyer lift for improved safety. With time, pt returned to supine spontaneously and needed  maxA for LE assist. Rolling R and L performed for pericare as well as lift sling placement, and pt placed in chair via hoyer lift and 2 person assist for safety.  Overall the patient demonstrated deficits (see "PT Problem List") that impede the patient's functional abilities, safety, and mobility and would benefit from skilled PT intervention. Recommendation is SNF due to acute decline in functional mobility and current level of assistance needed.       Follow Up Recommendations SNF    Equipment Recommendations  Other (comment) (TBD)    Recommendations for Other Services       Precautions / Restrictions Precautions Precautions: Fall Restrictions Weight Bearing Restrictions: No      Mobility  Bed Mobility Overal bed mobility: Needs Assistance Bed Mobility: Rolling;Sit to Supine;Supine to Sit Rolling: +2 for physical assistance;+2 for safety/equipment;Mod assist   Supine to sit: Mod assist;HOB elevated Sit to supine: Max assist   General bed mobility comments: modA with heavy use of bed rails to come into sitting. maxA for LE returning to bed    Transfers Overall transfer level: Needs assistance Equipment used: Rolling walker (2 wheeled)             General transfer comment: attempted with 1 person modA, pt able to come up into nearly standing, but very fatigued increased WOB noted, as well as elevated HR. returned to sitting EOB  Ambulation/Gait             General Gait Details: unable at this time  Information systems manager  Rankin (Stroke Patients Only)       Balance Overall balance assessment: Needs assistance Sitting-balance support: Feet supported Sitting balance-Leahy Scale: Fair Sitting balance - Comments: able to maintain balance with CGA, but more comfortable with bilateral UE supported on bed rails   Standing balance support: Bilateral upper extremity supported Standing balance-Leahy Scale: Poor Standing balance  comment: reliant on RW. unable to stand >10seconds due to fatigue/weakness                             Pertinent Vitals/Pain Pain Assessment: Faces Faces Pain Scale: Hurts a little bit Pain Location: buttocks Pain Descriptors / Indicators: Grimacing;Guarding;Moaning Pain Intervention(s): Limited activity within patient's tolerance;Monitored during session;Repositioned;Patient requesting pain meds-RN notified    Home Living Family/patient expects to be discharged to:: Private residence Living Arrangements: Non-relatives/Friends Available Help at Discharge: Available PRN/intermittently (caretaker comes in 3 x/week, 2-4 hrs/day, to assist with ADL/IADL) Type of Home: House Home Access: Ramped entrance     Home Layout: One level Home Equipment: Walker - 4 wheels;Electric scooter;Shower seat;Hand held shower head;Wheelchair - power Additional Comments: Uses 4ww in home (short distances) and electric w/c for longer distance    Prior Function Level of Independence: Needs assistance      ADL's / Homemaking Assistance Needed: caretaker assists with bathing, shopping, cooking        Hand Dominance        Extremity/Trunk Assessment   Upper Extremity Assessment Upper Extremity Assessment: Defer to OT evaluation    Lower Extremity Assessment Lower Extremity Assessment: RLE deficits/detail;LLE deficits/detail RLE Deficits / Details: unable to lift LE against gravity, pt reported R hip pain LLE Deficits / Details: much stronger than RLE, able to move freely against gravity       Communication   Communication: No difficulties  Cognition Arousal/Alertness: Awake/alert Behavior During Therapy: Agitated;WFL for tasks assessed/performed Overall Cognitive Status: Within Functional Limits for tasks assessed                                 General Comments: pt became resistive and agitated once sitting up on the bed due to wanting to transfer to chair. With  time, encouragement, and instruction pt returned to supine spontaneously.      General Comments      Exercises Other Exercises Other Exercises: supine heel slides, hip abduction and ankle pumps x10 bilaterally, AAROM for RLE (except ankle pumps) Other Exercises: Extended time spent in sitting due to pts agitation about wanting to get out bed to chair. Pt was re-directable and PT finally convinced pt to return to supine after explanation of hoyer lift use and current safety concerns Other Exercises: Pt assisted from bed to chair via hoyer lift and two person assist for safety   Assessment/Plan    PT Assessment Patient needs continued PT services  PT Problem List Decreased strength;Decreased mobility;Decreased safety awareness;Decreased range of motion;Decreased knowledge of precautions;Decreased activity tolerance;Decreased balance;Decreased knowledge of use of DME;Cardiopulmonary status limiting activity;Obesity       PT Treatment Interventions DME instruction;Therapeutic exercise;Gait training;Balance training;Neuromuscular re-education;Functional mobility training;Therapeutic activities;Patient/family education    PT Goals (Current goals can be found in the Care Plan section)  Acute Rehab PT Goals Patient Stated Goal: to get out of bed and get his strength back PT Goal Formulation: With patient Time For Goal Achievement: 04/29/20    Frequency Min 2X/week  Barriers to discharge Decreased caregiver support      Co-evaluation               AM-PAC PT "6 Clicks" Mobility  Outcome Measure Help needed turning from your back to your side while in a flat bed without using bedrails?: A Lot Help needed moving from lying on your back to sitting on the side of a flat bed without using bedrails?: A Lot Help needed moving to and from a bed to a chair (including a wheelchair)?: Total Help needed standing up from a chair using your arms (e.g., wheelchair or bedside chair)?:  Total Help needed to walk in hospital room?: Total Help needed climbing 3-5 steps with a railing? : Total 6 Click Score: 8    End of Session Equipment Utilized During Treatment: Oxygen Activity Tolerance: Patient limited by fatigue Patient left: in chair;with chair alarm set;with call bell/phone within reach Nurse Communication: Mobility status PT Visit Diagnosis: Other abnormalities of gait and mobility (R26.89);Muscle weakness (generalized) (M62.81);Difficulty in walking, not elsewhere classified (R26.2);Unsteadiness on feet (R26.81)    Time: 4585-9292 PT Time Calculation (min) (ACUTE ONLY): 68 min   Charges:   PT Evaluation $PT Eval Moderate Complexity: 1 Mod PT Treatments $Therapeutic Exercise: 8-22 mins $Therapeutic Activity: 23-37 mins       Olga Coaster PT, DPT 11:59 AM,04/15/20

## 2020-04-15 NOTE — Progress Notes (Addendum)
ANTICOAGULATION CONSULT NOTE  Pharmacy Consult for warfarin Indication: atrial fibrillation  Patient Measurements: Heparin Dosing Weight: 117 kg  Labs: Recent Labs    04/12/20 2026 04/13/20 0417 04/13/20 0417 04/14/20 0502 04/15/20 0645  HGB  --  12.3*   < > 13.1 12.4*  HCT  --  38.6*  --  40.3 37.9*  PLT  --  185  --  187 210  APTT 52* 51*  --  46*  --   LABPROT  --   --   --   --  15.2  INR  --   --   --   --  1.2  CREATININE  --  0.81  --  0.80  --    < > = values in this interval not displayed.    Estimated Creatinine Clearance: 172.8 mL/min (by C-G formula based on SCr of 0.8 mg/dL).   Medical History: Past Medical History:  Diagnosis Date  . Allergy   . Anxiety   . Arthritis   . Asthma   . Brain damage   . Chronic pain of both knees 07/13/2018  . Clotting disorder (HCC)   . COPD (chronic obstructive pulmonary disease) (HCC)   . Depression   . GERD (gastroesophageal reflux disease)   . HFrEF (heart failure with reduced ejection fraction) (HCC)    a. 03/2018 Echo: EF 25-30%, diff HK. Mod LAE.  [name] History of DVT (deep vein thrombosis)   . History of pulmonary embolism    a. Chronic coumadin.  [name] Hypertension   . MI (myocardial infarction) (HCC)   . Morbid obesity (HCC)   . Neck pain 07/13/2018  . NICM (nonischemic cardiomyopathy) (HCC)    a. s/p Cath x 3 - reportedly nl cors. Last cath 2019 in [place]; b. a. 03/2018 Echo: EF 25-30%, diff HK.  [name] Persistent atrial fibrillation (HCC)    a. 03/2018 s/p DCCV; b. CHA2DS2VASc = 1-->Xarelto (later changed to warfarin); c. 05/2018 recurrent afib-->Amio initiated.  . Sleep apnea   . Sleep apnea     Assessment: 60 year old male presented with SOB. Patient with h/o atrial fibrillation on warfarin PTA admitted with a subtherapeutic INR. Pharmacy was consulted for heparin for afib. Thrombocytopenia on admission but improved. HIT antigen and SRA is negative. INR is 3.6 - elevated due to the argatroban. Once its stopped INR should  normalize. Pt was subtherapeutic on admission at 1.6. Home regimen for patient may not be appropriate. CBC stable.    Home dose warfarin: 5 mg daily.   Date INR Plan  2/28 3.6  Warfarin 7.5 mg   3/1 1.2 Warfarin 7.5 mg    Goal of Therapy:  INR 2 -3    Plan:  --INR is subtherapeutic as expected given re-initiation only yesterday. Will give warfarin 7.5 mg again tonight --Daily INR per protocol  Tressie Ellis 04/15/2020 7:25 AM

## 2020-04-15 NOTE — Progress Notes (Addendum)
ANTICOAGULATION CONSULT NOTE  Pharmacy Consult for Warfarin Indication: atrial fibrillation  Patient Measurements:  Weight:   03/1: 204.6 kg 2/28: 185.1 kg 2/27: 186 kg 2/27: 190.5 kg  Labs: Recent Labs    04/12/20 2026 04/13/20 0417 04/13/20 0417 04/14/20 0502 04/15/20 0645  HGB  --  12.3*   < > 13.1 12.4*  HCT  --  38.6*  --  40.3 37.9*  PLT  --  185  --  187 210  APTT 52* 51*  --  46*  --   LABPROT  --   --   --   --  15.2  INR  --   --   --   --  1.2  CREATININE  --  0.81  --  0.80  --    < > = values in this interval not displayed.    Estimated Creatinine Clearance: 172.8 mL/min (by C-G formula based on SCr of 0.8 mg/dL).   Medical History: Past Medical History:  Diagnosis Date  . Allergy   . Anxiety   . Arthritis   . Asthma   . Brain damage   . Chronic pain of both knees 07/13/2018  . Clotting disorder (HCC)   . COPD (chronic obstructive pulmonary disease) (HCC)   . Depression   . GERD (gastroesophageal reflux disease)   . HFrEF (heart failure with reduced ejection fraction) (HCC)    a. 03/2018 Echo: EF 25-30%, diff HK. Mod LAE.  [name] History of DVT (deep vein thrombosis)   . History of pulmonary embolism    a. Chronic coumadin.  [name] Hypertension   . MI (myocardial infarction) (HCC)   . Morbid obesity (HCC)   . Neck pain 07/13/2018  . NICM (nonischemic cardiomyopathy) (HCC)    a. s/p Cath x 3 - reportedly nl cors. Last cath 2019 in [place]; b. a. 03/2018 Echo: EF 25-30%, diff HK.  [name] Persistent atrial fibrillation (HCC)    a. 03/2018 s/p DCCV; b. CHA2DS2VASc = 1-->Xarelto (later changed to warfarin); c. 05/2018 recurrent afib-->Amio initiated.  . Sleep apnea   . Sleep apnea     Assessment: 60 year old male presented with SOB. Patient with h/o atrial fibrillation on warfarin PTA admitted with a subtherapeutic INR. Pharmacy was consulted for heparin for afib. Thrombocytopenia on admission but improved. HIT antigen and SRA is negative. INR is 3.6 - elevated due to  the argatroban. Once its stopped INR should normalize. Pt was subtherapeutic on admission at 1.6. Home regimen for patient may not be appropriate.  Pharmacy is consulted to re-initiate warfarin and bridge until therapeutic with Lovenox. CHA2DS2-VASc = 6 (CHF, HTN, diabetes, VTE, CAD). Medical history includes "clotting disorder" but un-able to find previous thrombophilia work-up. Other risk factors include COVID-19 infection and obesity. Recent CTA in 12/2019, 01/2020, and 03/2020 without evidence of PE. 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   Home dose warfarin: 5 mg daily  Date INR Plan  2/28 3.6  Warfarin 7.5 mg   3/1 1.2 Warfarin 7.5 mg    Goal of Therapy:  INR 2 -3    Plan:   Lovenox --Will start Lovenox 185 mg (~ 1 mg/kg) q12h. Weights labile. Per chart review, 185 kg appears most c/w recent weights. Variability may be secondary to fluid status / weight collection method --Will check 4-hr anti-Xa level after third dose given bodyweight outlier (> 180 kg). Goal 0.6 - 1. Adjust as indicated --Bridge until  therapeutic x 2 days and at least 5 days after re-initiation of warfarin  Warfarin: --INR is subtherapeutic as expected given re-initiation only yesterday. Will give warfarin 7.5 mg again tonight --Daily INR per protocol  Tressie Ellis 04/15/2020 5:29 PM

## 2020-04-15 NOTE — Progress Notes (Signed)
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 Secondary to COVID19 pneumonia, superimposed MRSA pneumonia, & pulmonary vascular congestion. Continue on steroids, zyvox & lasix. CTA chest neg for PE. Continue on supplemental oxygen and wean back to baseline as tolerated. Continue on airborne & contact precautions  Cardiac arrest: w/ asystole. ROSC obtained in 10 mins. Suspect respiratory arrest.   DM2: likely new onset. HbA1c 6.6. Will continue on SSI w/ accuchecks. Will consult DM coordinator   Transaminitis: etiology unclear, trending down   Elevated troponins: likely secondary to demand ischemia   A. fib: likely PAF w/ intermittent RVR. Continue on warfarin and started on metoprolol  Acute on chronic systolic CHF: echo shows EF 30-35%, normal diastolic dysfunction. Monitor I/Os. Continue on IV lasix   UTI: secondary to enterococcus faecium. Continue on zyvox   Hyponatremia: resolved  Morbid obesity: BMI 62.0. Complicates overall care and prognosis   Generalized weakness: PT recs SNF    DVT prophylaxis: warfarin  Code Status: full  Family Communication:  Disposition Plan: likely d/c to SNF   Level of care: Progressive Cardiac   Consultants:     Procedures:    Antimicrobials: zyvox    Subjective: Pt c/o weakness   Objective: Vitals:   04/14/20 1147 04/14/20 2023 04/15/20 0343 04/15/20 0547  BP: (!) 158/94 (!) 145/100 112/67   Pulse: 74 89 73   Resp: 19 20 20    Temp: 97.9 F (36.6 C) 98.5 F (36.9 C) 98.3 F (36.8 C)   TempSrc:  Oral Oral   SpO2: 100% 96% 95%   Weight:    (!) 204.6 kg  Height:        Intake/Output Summary (Last 24 hours) at 04/15/2020 0810 Last data filed at 04/15/2020  0400 Gross per 24 hour  Intake 267.76 ml  Output 1950 ml  Net -1682.24 ml   Filed Weights   04/13/20 1544 04/14/20 0500 04/15/20 0547  Weight: (!) 186 kg (!) 185.1 kg (!) 204.6 kg    Examination:  General exam: Appears comfortable. Morbidly obese Respiratory system: decreased breath sounds b/l  Cardiovascular system: irregularly irregular. No rubs or gallops  Gastrointestinal system: Abd is soft, obese, NT & hypoactive bowel sounds  Central nervous system: Alert and oriented. Moves all 4 extremities Psychiatry: Judgement and insight appear normal. Flat mood and affect    Data Reviewed: I have personally reviewed following labs and imaging studies  CBC: Recent Labs  Lab 04/11/20 0516 04/12/20 0350 04/13/20 0417 04/14/20 0502 04/15/20 0645  WBC 8.7 6.7 8.9 7.0 10.4  NEUTROABS 5.8 4.4 4.8 5.3 8.7*  HGB 12.6* 12.2* 12.3* 13.1 12.4*  HCT 39.6 38.3* 38.6* 40.3 37.9*  MCV 99.0 98.2 97.2 96.2 96.2  PLT 130* 138* 185 187 210   Basic Metabolic Panel: Recent Labs  Lab 04/10/20 0600 04/11/20 0516 04/12/20 0350 04/13/20 0417 04/14/20 0502 04/15/20 0645  NA 137 139 136 135 136  --   K 4.4 4.2 4.2 3.6 4.0  --   CL 95* 97* 94* 93* 92*  --   CO2 30 34* 32 32 32  --   GLUCOSE 148* 171* 235* 150* 177*  --   BUN 34* 41* 49* 44* 37*  --   CREATININE 1.06 1.00 0.96  0.81 0.80  --   CALCIUM 7.4* 7.7* 8.2* 8.9 8.9  --   MG 1.9 1.8 2.2 2.2 2.0 2.2  PHOS 3.5 4.1 2.2* 2.8 4.3 3.9   GFR: Estimated Creatinine Clearance: 172.8 mL/min (by C-G formula based on SCr of 0.8 mg/dL). Liver Function Tests: Recent Labs  Lab 04/09/20 0500 04/10/20 0600 04/11/20 0516 04/12/20 0350 04/13/20 0417  AST 171* 167* 183* 141* 73*  ALT 59* 67* 86* 84* 67*  ALKPHOS 94 84 82 85 76  BILITOT 2.3* 1.9* 2.1* 1.6* 1.7*  PROT 6.8 6.5 6.9 6.4* 6.1*  ALBUMIN 2.6* 2.5* 3.0* 2.7* 2.7*   No results for input(s): LIPASE, AMYLASE in the last 168 hours. No results for input(s): AMMONIA in the last 168  hours. Coagulation Profile: Recent Labs  Lab 04/15/20 0645  INR 1.2   Cardiac Enzymes: No results for input(s): CKTOTAL, CKMB, CKMBINDEX, TROPONINI in the last 168 hours. BNP (last 3 results) No results for input(s): PROBNP in the last 8760 hours. HbA1C: No results for input(s): HGBA1C in the last 72 hours. CBG: Recent Labs  Lab 04/13/20 2051 04/14/20 0829 04/14/20 1139 04/14/20 1700 04/14/20 2020  GLUCAP 166* 151* 196* 201* 162*   Lipid Profile: Recent Labs    04/13/20 0417  TRIG 72   Thyroid Function Tests: No results for input(s): TSH, T4TOTAL, FREET4, T3FREE, THYROIDAB in the last 72 hours. Anemia Panel: No results for input(s): VITAMINB12, FOLATE, FERRITIN, TIBC, IRON, RETICCTPCT in the last 72 hours. Sepsis Labs: Recent Labs  Lab 04/09/20 0500  PROCALCITON 0.34    Recent Results (from the past 240 hour(s))  Resp Panel by RT-PCR (Flu A&B, Covid) Nasopharyngeal Swab     Status: Abnormal   Collection Time: 04/06/20 11:21 AM   Specimen: Nasopharyngeal Swab; Nasopharyngeal(NP) swabs in vial transport medium  Result Value Ref Range Status   SARS Coronavirus 2 by RT PCR POSITIVE (A) NEGATIVE Final    Comment: RESULT CALLED TO, READ BACK BY AND VERIFIED WITH: ISABELLA LAPIETRA 04/06/20 AT 1346 BY ACR (NOTE) SARS-CoV-2 target nucleic acids are DETECTED.  The SARS-CoV-2 RNA is generally detectable in upper respiratory specimens during the acute phase of infection. Positive results are indicative of the presence of the identified virus, but do not rule out bacterial infection or co-infection with other pathogens not detected by the test. Clinical correlation with patient history and other diagnostic information is necessary to determine patient infection status. The expected result is Negative.  Fact Sheet for Patients: BloggerCourse.com  Fact Sheet for Healthcare Providers: SeriousBroker.it  This test is not  yet approved or cleared by the [place] FDA and  has been authorized for detection and/or diagnosis of SARS-CoV-2 by FDA under an Emergency Use Authorization (EUA).  This EUA will remain in effect (meaning this test  can be used) for the duration of  the COVID-19 declaration under Section 564(b)(1) of the Act, 21 U.S.C. section 360bbb-3(b)(1), unless the authorization is terminated or revoked sooner.     Influenza A by PCR NEGATIVE NEGATIVE Final   Influenza B by PCR NEGATIVE NEGATIVE Final    Comment: (NOTE) The Xpert Xpress SARS-CoV-2/FLU/RSV plus assay is intended as an aid in the diagnosis of influenza from Nasopharyngeal swab specimens and should not be used as a sole basis for treatment. Nasal washings and aspirates are unacceptable for Xpert Xpress SARS-CoV-2/FLU/RSV testing.  Fact Sheet for Patients: BloggerCourse.com  Fact Sheet for Healthcare Providers: SeriousBroker.it  This test is not yet approved or cleared by the  [place] [employer] trader and has been authorized for detection and/or diagnosis of SARS-CoV-2 by FDA under an TEFL teacher (EUA). This EUA will remain in effect (meaning this test can be used) for the duration of the COVID-19 declaration under Section 564(b)(1) of the Act, 21 U.S.C. section 360bbb-3(b)(1), unless the authorization is terminated or revoked.  Performed at [facility], [address] [place], [place] [address]   Blood culture (routine x 2)     Status: None   Collection Time: 04/06/20 12:10 PM   Specimen: Left Antecubital; Blood  Result Value Ref Range Status   Specimen Description LEFT ANTECUBITAL  Final   Special Requests   Final    BOTTLES DRAWN AEROBIC AND ANAEROBIC Blood Culture adequate volume   Culture   Final    NO GROWTH 5 DAYS Performed at [facility], [address] [place], [place] [address]    Report Status 04/11/2020 FINAL  Final   Urine culture     Status: Abnormal   Collection Time: 04/06/20 12:13 PM   Specimen: In/Out Cath Urine  Result Value Ref Range Status   Specimen Description   Final    IN/OUT CATH URINE Performed at [facility], [address] [place], [place] [address]    Special Requests   Final    NONE Performed at [facility], [address] [place], [place] [address]    Culture (A)  Final    20,000 COLONIES/mL ENTEROCOCCUS FAECIUM VANCOMYCIN RESISTANT ENTEROCOCCUS ISOLATED    Report Status 04/08/2020 FINAL  Final   Organism ID, Bacteria ENTEROCOCCUS FAECIUM (A)  Final      Susceptibility   Enterococcus faecium - MIC*    AMPICILLIN >=32 RESISTANT Resistant     NITROFURANTOIN 64 INTERMEDIATE Intermediate     VANCOMYCIN >=32 RESISTANT Resistant     LINEZOLID 2 SENSITIVE Sensitive     * 20,000 COLONIES/mL ENTEROCOCCUS FAECIUM  Blood culture (routine x 2)     Status: None   Collection Time: 04/06/20 12:15 PM   Specimen: BLOOD LEFT FOREARM  Result Value Ref Range Status   Specimen Description BLOOD LEFT FOREARM  Final   Special Requests   Final    BOTTLES DRAWN AEROBIC AND ANAEROBIC Blood Culture results may not be optimal due to an excessive volume of blood received in culture bottles   Culture   Final    NO GROWTH 5 DAYS Performed at [facility], [address] [place], [place] [address]    Report Status 04/11/2020 FINAL  Final  MRSA PCR Screening     Status: Abnormal   Collection Time: 04/07/20  9:47 AM   Specimen: Nasal Mucosa; Nasopharyngeal  Result Value Ref Range Status   MRSA by PCR POSITIVE (A) NEGATIVE Final    Comment:        The GeneXpert MRSA Assay (FDA approved for NASAL specimens only), is one component of a comprehensive MRSA colonization surveillance program. It is not intended to diagnose MRSA infection nor to guide or monitor treatment for MRSA infections. RESULT CALLED TO, READ BACK BY AND VERIFIED WITH:  [facility] FIELDS AT  1132 04/07/20 SDR Performed at [facility], [address] [place], [place] [address]   Culture, Respiratory w Gram Stain     Status: None   Collection Time: 04/07/20 11:39 AM   Specimen: Tracheal Aspirate; Respiratory  Result Value Ref Range Status   Specimen Description   Final    TRACHEAL ASPIRATE Performed at [facility], [address], [place], [place] [address]    Special Requests   Final    NONE Performed at [facility], [address] [place], [place] [address]    Gram Stain   Final    ABUNDANT WBC PRESENT, PREDOMINANTLY PMN FEW GRAM POSITIVE COCCI Performed at [facility], [address], [place], [place] [address]    Culture RARE METHICILLIN RESISTANT STAPHYLOCOCCUS AUREUS  Final   Report Status 04/10/2020 FINAL  Final   Organism ID, Bacteria METHICILLIN RESISTANT STAPHYLOCOCCUS AUREUS  Final      Susceptibility   Methicillin resistant staphylococcus aureus - MIC*    CIPROFLOXACIN 4 RESISTANT Resistant     ERYTHROMYCIN >=8 RESISTANT Resistant     GENTAMICIN <=0.5 SENSITIVE Sensitive     OXACILLIN >=4 RESISTANT Resistant     TETRACYCLINE <=1 SENSITIVE Sensitive     VANCOMYCIN 1 SENSITIVE Sensitive     TRIMETH/SULFA <=10 SENSITIVE Sensitive     CLINDAMYCIN <=0.25 SENSITIVE Sensitive     RIFAMPIN <=0.5 SENSITIVE Sensitive     Inducible Clindamycin NEGATIVE Sensitive     * RARE METHICILLIN RESISTANT STAPHYLOCOCCUS AUREUS         Radiology Studies: No results found.      Scheduled Meds: . Chlorhexidine Gluconate Cloth  6 each Topical Daily  . furosemide  40 mg Intravenous Daily  . hydrocerin   Topical Daily  . insulin aspart  0-20 Units Subcutaneous TID PC & HS  . Ipratropium-Albuterol  1 puff Inhalation Q6H  . levothyroxine  75 mcg Oral Q0600  . linezolid  600 mg Oral Q12H  . mouth rinse  15 mL Mouth Rinse BID  . methylPREDNISolone (SOLU-MEDROL) injection  60 mg Intravenous Q12H  . multivitamin with minerals  1  tablet Oral Daily  . traZODone  150 mg Oral QHS  . warfarin  7.5 mg Oral Once  . Warfarin - Pharmacist Dosing Inpatient   Does not apply q1600   Continuous Infusions: . sodium chloride 250 mL (04/13/20 2057)     LOS: 9 days    Time spent: 30 mins     Charise Killian, MD [facility] Pager [phone]  If 7PM-7AM, please contact night-coverage 04/15/2020, 8:09 AM

## 2020-04-16 DIAGNOSIS — J9601 Acute respiratory failure with hypoxia: Secondary | ICD-10-CM | POA: Diagnosis not present

## 2020-04-16 DIAGNOSIS — R092 Respiratory arrest: Secondary | ICD-10-CM | POA: Diagnosis not present

## 2020-04-16 DIAGNOSIS — I5023 Acute on chronic systolic (congestive) heart failure: Secondary | ICD-10-CM | POA: Diagnosis not present

## 2020-04-16 LAB — COMPREHENSIVE METABOLIC PANEL
ALT: 53 U/L — ABNORMAL HIGH (ref 0–44)
AST: 33 U/L (ref 15–41)
Albumin: 2.7 g/dL — ABNORMAL LOW (ref 3.5–5.0)
Alkaline Phosphatase: 64 U/L (ref 38–126)
Anion gap: 10 (ref 5–15)
BUN: 40 mg/dL — ABNORMAL HIGH (ref 6–20)
CO2: 34 mmol/L — ABNORMAL HIGH (ref 22–32)
Calcium: 8.8 mg/dL — ABNORMAL LOW (ref 8.9–10.3)
Chloride: 92 mmol/L — ABNORMAL LOW (ref 98–111)
Creatinine, Ser: 0.8 mg/dL (ref 0.61–1.24)
GFR, Estimated: >60 mL/min (ref >60)
Glucose, Bld: 171 mg/dL — ABNORMAL HIGH (ref 70–99)
Potassium: 4 mmol/L (ref 3.5–5.1)
Sodium: 136 mmol/L (ref 135–145)
Total Bilirubin: 1 mg/dL (ref 0.3–1.2)
Total Protein: 5.6 g/dL — ABNORMAL LOW (ref 6.5–8.1)

## 2020-04-16 LAB — CBC WITH DIFFERENTIAL/PLATELET
Abs Immature Granulocytes: 0.13 10*3/uL — ABNORMAL HIGH (ref 0.00–0.07)
Basophils Absolute: 0 10*3/uL (ref 0.0–0.1)
Basophils Relative: 0 %
Eosinophils Absolute: 0 10*3/uL (ref 0.0–0.5)
Eosinophils Relative: 0 %
HCT: 35.3 % — ABNORMAL LOW (ref 39.0–52.0)
Hemoglobin: 11.6 g/dL — ABNORMAL LOW (ref 13.0–17.0)
Immature Granulocytes: 2 %
Lymphocytes Relative: 6 %
Lymphs Abs: 0.5 10*3/uL — ABNORMAL LOW (ref 0.7–4.0)
MCH: 31.9 pg (ref 26.0–34.0)
MCHC: 32.9 g/dL (ref 30.0–36.0)
MCV: 97 fL (ref 80.0–100.0)
Monocytes Absolute: 0.6 10*3/uL (ref 0.1–1.0)
Monocytes Relative: 7 %
Neutro Abs: 7.5 10*3/uL (ref 1.7–7.7)
Neutrophils Relative %: 85 %
Platelets: 170 10*3/uL (ref 150–400)
RBC: 3.64 MIL/uL — ABNORMAL LOW (ref 4.22–5.81)
RDW: 17.8 % — ABNORMAL HIGH (ref 11.5–15.5)
WBC: 8.8 10*3/uL (ref 4.0–10.5)
nRBC: 0.2 % (ref 0.0–0.2)

## 2020-04-16 LAB — PROTIME-INR
INR: 1.2 (ref 0.8–1.2)
Prothrombin Time: 15.2 seconds (ref 11.4–15.2)

## 2020-04-16 LAB — GLUCOSE, CAPILLARY
Glucose-Capillary: 156 mg/dL — ABNORMAL HIGH (ref 70–99)
Glucose-Capillary: 174 mg/dL — ABNORMAL HIGH (ref 70–99)
Glucose-Capillary: 172 mg/dL — ABNORMAL HIGH (ref 70–99)
Glucose-Capillary: 182 mg/dL — ABNORMAL HIGH (ref 70–99)

## 2020-04-16 LAB — MAGNESIUM: Magnesium: 2.2 mg/dL (ref 1.7–2.4)

## 2020-04-16 LAB — C-REACTIVE PROTEIN: CRP: 0.8 mg/dL (ref <1.0)

## 2020-04-16 LAB — D-DIMER, QUANTITATIVE: D-Dimer, Quant: 1.05 ug/mL-FEU — ABNORMAL HIGH (ref 0.00–0.50)

## 2020-04-16 LAB — PHOSPHORUS: Phosphorus: 4.4 mg/dL (ref 2.5–4.6)

## 2020-04-16 MED ORDER — OXYCODONE-ACETAMINOPHEN 7.5-325 MG PO TABS
2.0000 | ORAL_TABLET | Freq: Four times a day (QID) | ORAL | Status: DC | PRN
  Administered 2020-04-16 – 2020-04-19 (×9): 2 via ORAL
  Filled 2020-04-16 (×9): qty 2

## 2020-04-16 MED ORDER — WARFARIN SODIUM 10 MG PO TABS
10.0000 mg | ORAL_TABLET | Freq: Once | ORAL | Status: AC
  Administered 2020-04-16: 10 mg via ORAL
  Filled 2020-04-16: qty 1

## 2020-04-16 NOTE — Progress Notes (Addendum)
Inpatient Diabetes Program Recommendations  AACE/ADA: New Consensus Statement on Inpatient Glycemic Control   Target Ranges:  Prepandial:   less than 140 mg/dL      Peak postprandial:   less than 180 mg/dL (1-2 hours)      Critically ill patients:  140 - 180 mg/dL  Results for [name] [name], [name] [name] (MRN [id number]) as of 04/16/2020 07:44  Ref. Range 04/16/2020 05:40  Glucose Latest Ref Range: 70 - 99 mg/dL 518 (H)   Results for [name] [name], [name] [name] (MRN [id number]) as of 04/16/2020 07:44  Ref. Range 04/15/2020 08:24 04/15/2020 12:20 04/15/2020 17:12 04/15/2020 20:09  Glucose-Capillary Latest Ref Range: 70 - 99 mg/dL 160 (H) 109 (H) 323 (H) 208 (H)  Results for [name] [name], [name] [name] (MRN [id number]) as of 04/16/2020 07:44  Ref. Range 06/23/2019 08:09 11/08/2019 04:46 01/27/2020 04:54 04/07/2020 22:48 04/15/2020 05:00  Hemoglobin A1C Latest Ref Range: 4.8 - 5.6 % 6.5 (H) 6.2 (H) 6.7 (H) 6.4 (H) 6.6 (H)   Review of Glycemic Control  Diabetes history: DM2 Outpatient Diabetes medications: None Current orders for Inpatient glycemic control: Novolog 0-20 units AC&HS; Solumedrol 40 mg Q12H  Inpatient Diabetes Program Recommendations:    Insulin: If steroids are continued as ordered, please consider ordering Novolog 3 units TID with meals if patient eats at least 50% of meals.  HbgA1C:  A1C 6.4% on 04/07/20 indicating an average glucose of 137 mg/dl. Repeat A1C on 04/15/20 was 6.6% and patient has been ordered steroids since 04/06/20 when he was admitted. At time of discharge, may want to consider prescribing Metformin 500 mg BID (if no contraindications to using it outpatient).   NOTE: Noted consult for new onset DM. Per H&P on 04/06/20, patient has DM2 hx and A1C was 6.4% on 04/06/20.  Also noted PCP note on 10/06/19 which notes diet controlled DM. Patient has been receiving steroids since admitted on 04/06/20 and repeat A1C 6.6% on 04/15/20.  Post prandial glucose consistently elevated likely due to steroids. Would recommend ordering  Novolog meal coverage if steroids will be continued as ordered. Will continue to follow along while inpatient.  Addendum 04/16/20@12 :44-Spoke with patient over the phone regarding DM hx. Patient states that he has been told that he had DM and preDM in the past and he states that he takes a pill for DM as an outpatient but he does not know the name of it. Patient states that his provider has always told him that his DM is good and on borderline. Inquired about knowledge of an A1C and patient states that he does not know what an A1C is. Explained what an A1C is and informed patient that his A1C was 6.4% on 04/07/20 and 6.6% on 04/15/20 (after 10 days of getting steroids). Patient reports that he does not check glucose at home and has never been asked to. Discussed impact of steroids on glycemic control and explained that since he has been receiving steroids since he arrived at the hospital they are contributing to hyperglycemia.  Patient states that he gets medications from [facility] and all of his medications are prepacked in pill packs. Encouraged patient to continue to follow up with PCP regarding DM control. Patient verbalized understanding of information and reports that he has no questions regarding DM.   Called [facility] and was informed that patient is not on any DM medication as an outpatient. Called patient back to make him aware that he is not prescribed any DM medication as an outpatient currently.  Thanks, Orlando Penner, RN,  MSN, CDE Diabetes Coordinator Inpatient Diabetes Program [phone] (Team Pager from 8am to 5pm)

## 2020-04-16 NOTE — Plan of Care (Signed)

## 2020-04-16 NOTE — Progress Notes (Signed)
Pt refused dressing change to BLE once transferred back to bed, pt stated he would like to just go to bed. No further actions at this time, will attempt dressing change again in the morning.   04/16/20 0545: Pt again refused dressing change to BLE, pt stated his was comfortably sleeping on his left side and did not want to be awake currently. No further actions at this time, will continue to monitor.

## 2020-04-16 NOTE — Progress Notes (Addendum)
ANTICOAGULATION CONSULT NOTE  Pharmacy Consult for Warfarin Indication: atrial fibrillation  Patient Measurements:  Weight:   03/2: 204.1 kg 03/1: 204.6 kg 2/28: 185.1 kg 2/27: 186 kg 2/27: 190.5 kg  Labs: Recent Labs    04/14/20 0502 04/15/20 0645 04/16/20 0540  HGB 13.1 12.4* 11.6*  HCT 40.3 37.9* 35.3*  PLT 187 210 170  APTT 46*  --   --   LABPROT  --  15.2 15.2  INR  --  1.2 1.2  CREATININE 0.80  --  0.80    Estimated Creatinine Clearance: 172.5 mL/min (by C-G formula based on SCr of 0.8 mg/dL).   Medical History: Past Medical History:  Diagnosis Date  . Allergy   . Anxiety   . Arthritis   . Asthma   . Brain damage   . Chronic pain of both knees 07/13/2018  . Clotting disorder (HCC)   . COPD (chronic obstructive pulmonary disease) (HCC)   . Depression   . GERD (gastroesophageal reflux disease)   . HFrEF (heart failure with reduced ejection fraction) (HCC)    a. 03/2018 Echo: EF 25-30%, diff HK. Mod LAE.  [name] History of DVT (deep vein thrombosis)   . History of pulmonary embolism    a. Chronic coumadin.  [name] Hypertension   . MI (myocardial infarction) (HCC)   . Morbid obesity (HCC)   . Neck pain 07/13/2018  . NICM (nonischemic cardiomyopathy) (HCC)    a. s/p Cath x 3 - reportedly nl cors. Last cath 2019 in [place]; b. a. 03/2018 Echo: EF 25-30%, diff HK.  [name] Persistent atrial fibrillation (HCC)    a. 03/2018 s/p DCCV; b. CHA2DS2VASc = 1-->Xarelto (later changed to warfarin); c. 05/2018 recurrent afib-->Amio initiated.  . Sleep apnea   . Sleep apnea     Assessment: 60 year old male presented with SOB. Patient with h/o atrial fibrillation on warfarin PTA admitted with a subtherapeutic INR. Pharmacy was consulted for heparin for afib. Thrombocytopenia on admission but improved. HIT antigen and SRA is negative. INR is 3.6 - elevated due to the argatroban. Once its stopped INR should normalize. Pt was subtherapeutic on admission at 1.6. Home regimen for patient may not  be appropriate.  Pharmacy is consulted to re-initiate warfarin and bridge until therapeutic with Lovenox. CHA2DS2-VASc = 6 (CHF, HTN, diabetes, VTE, CAD). Medical history includes "clotting disorder" but un-able to find previous thrombophilia work-up. Other risk factors include COVID-19 infection and obesity. Recent CTA in 12/2019, 01/2020, and 03/2020 without evidence of PE. 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   Home dose warfarin: 5 mg daily  Date INR Plan  2/28 3.6  Warfarin 7.5 mg   3/1 1.2 Warfarin 7.5 mg  3/2 1.2 Warfarin 10 mg    Goal of Therapy:  INR 2 -3    Plan:   Lovenox --Continue Lovenox 185 mg (~ 1 mg/kg) q12h. Weights labile. Per chart review, 185 kg appears most c/w recent weights. Variability may be secondary to fluid status / weight collection method --Will check 4-hr anti-Xa level after fourth dose given bodyweight outlier (> 180 kg). Goal 0.6 - 1. Adjust as indicated --Bridge until therapeutic x 2 days and at least 5 days after re-initiation of warfarin  Warfarin: --INR is subtherapeutic and un-changed from yesterday. Will give warfarin 10 mg tonight --Daily INR per protocol  [name] [name] 04/16/2020 9:07 AM

## 2020-04-16 NOTE — Progress Notes (Signed)
Cleansed and changed the pts leg dressings

## 2020-04-16 NOTE — Progress Notes (Signed)
PROGRESS NOTE    [name] [name]  WFU:[id number] DOB: 1960/12/31 DOA: 04/06/2020 PCP: [name], DO   Assessment & Plan:   Active Problems:   Acute respiratory failure (HCC)   Respiratory arrest (HCC)   Acute on chronic hypoxic & hypercapnic respiratory failure: improving. Secondary to COVID19 pneumonia, superimposed MRSA pneumonia, & pulmonary vascular congestion. Continue on steroids, zyvox & lasix. CTA chest neg for PE. Continue on supplemental oxygen and wean back to baseline as tolerated. Continue on airborne & contact precautions  Cardiac arrest: w/ asystole. ROSC obtained in 10 mins. Suspect respiratory arrest.   DM2: likely new onset. HbA1c 6.6, well controlled. Continue on SSI w/ accuchecks. DM coordinator following   Transaminitis: etiology unclear, AST is WNL & ALT is elevated but trending down  Elevated troponins: likely secondary to demand ischemia   A. fib: likely PAF, w/ intermittent RVR. Continue on metoprolol, warfarin. Continue w/ bridge w/ lovenox   Acute on chronic systolic CHF: echos shows EF 30-35%, normal diastolic function. Continue on IV lasix. Monitor I/Os. Slightly neg fluid balance  UTI: secondary to enterococcus faecium. Continue on zyvox   Hyponatremia: resolved  Morbid obesity: BMI 62.0. Complicates overall care and prognosis   Generalized weakness: PT recs SNF    DVT prophylaxis: warfarin  Code Status: full  Family Communication:  Disposition Plan: likely d/c to SNF   Level of care: Med-Surg   Consultants:     Procedures:    Antimicrobials: zyvox    Subjective: Pt c/o joint pain all over   Objective: Vitals:   04/15/20 1940 04/16/20 0457 04/16/20 0512 04/16/20 0802  BP: (!) 157/88 (!) 121/94  (!) 128/91  Pulse: 76 76  79  Resp: 19 19  19   Temp: 98 F (36.7 C) 98.7 F (37.1 C)  98.8 F (37.1 C)  TempSrc: Oral Oral  Axillary  SpO2: 93% 99%  99%  Weight:   (!) 204.1 kg   Height:        Intake/Output  Summary (Last 24 hours) at 04/16/2020 0815 Last data filed at 04/16/2020 0500 Gross per 24 hour  Intake 360 ml  Output 1200 ml  Net -840 ml   Filed Weights   04/14/20 0500 04/15/20 0547 04/16/20 0512  Weight: (!) 185.1 kg (!) 204.6 kg (!) 204.1 kg    Examination:  General exam: Appears uncomfortable. Morbidly obese Respiratory system: diminished breath sounds b/l  Cardiovascular system: irregularly irregular. No rubs or gallops  Gastrointestinal system: Abd is obese, NT & hypoactive bowel sounds  Central nervous system: Alert and oriented. Moves all 4 extremities Psychiatry: Judgement and insight appear normal. Flat mood and affect    Data Reviewed: I have personally reviewed following labs and imaging studies  CBC: Recent Labs  Lab 04/12/20 0350 04/13/20 0417 04/14/20 0502 04/15/20 0645 04/16/20 0540  WBC 6.7 8.9 7.0 10.4 8.8  NEUTROABS 4.4 4.8 5.3 8.7* 7.5  HGB 12.2* 12.3* 13.1 12.4* 11.6*  HCT 38.3* 38.6* 40.3 37.9* 35.3*  MCV 98.2 97.2 96.2 96.2 97.0  PLT 138* 185 187 210 170   Basic Metabolic Panel: Recent Labs  Lab 04/11/20 0516 04/12/20 0350 04/13/20 0417 04/14/20 0502 04/15/20 0645 04/16/20 0540  NA 139 136 135 136  --  136  K 4.2 4.2 3.6 4.0  --  4.0  CL 97* 94* 93* 92*  --  92*  CO2 34* 32 32 32  --  34*  GLUCOSE 171* 235* 150* 177*  --  171*  BUN 41*  49* 44* 37*  --  40*  CREATININE 1.00 0.96 0.81 0.80  --  0.80  CALCIUM 7.7* 8.2* 8.9 8.9  --  8.8*  MG 1.8 2.2 2.2 2.0 2.2 2.2  PHOS 4.1 2.2* 2.8 4.3 3.9 4.4   GFR: Estimated Creatinine Clearance: 172.5 mL/min (by C-G formula based on SCr of 0.8 mg/dL). Liver Function Tests: Recent Labs  Lab 04/10/20 0600 04/11/20 0516 04/12/20 0350 04/13/20 0417 04/16/20 0540  AST 167* 183* 141* 73* 33  ALT 67* 86* 84* 67* 53*  ALKPHOS 84 82 85 76 64  BILITOT 1.9* 2.1* 1.6* 1.7* 1.0  PROT 6.5 6.9 6.4* 6.1* 5.6*  ALBUMIN 2.5* 3.0* 2.7* 2.7* 2.7*   No results for input(s): LIPASE, AMYLASE in the last 168  hours. No results for input(s): AMMONIA in the last 168 hours. Coagulation Profile: Recent Labs  Lab 04/15/20 0645 04/16/20 0540  INR 1.2 1.2   Cardiac Enzymes: No results for input(s): CKTOTAL, CKMB, CKMBINDEX, TROPONINI in the last 168 hours. BNP (last 3 results) No results for input(s): PROBNP in the last 8760 hours. HbA1C: Recent Labs    04/15/20 0500  HGBA1C 6.6*   CBG: Recent Labs  Lab 04/14/20 2020 04/15/20 0824 04/15/20 1220 04/15/20 1712 04/15/20 2009  GLUCAP 162* 176* 191* 202* 208*   Lipid Profile: No results for input(s): CHOL, HDL, LDLCALC, TRIG, CHOLHDL, LDLDIRECT in the last 72 hours. Thyroid Function Tests: No results for input(s): TSH, T4TOTAL, FREET4, T3FREE, THYROIDAB in the last 72 hours. Anemia Panel: No results for input(s): VITAMINB12, FOLATE, FERRITIN, TIBC, IRON, RETICCTPCT in the last 72 hours. Sepsis Labs: No results for input(s): PROCALCITON, LATICACIDVEN in the last 168 hours.  Recent Results (from the past 240 hour(s))  Resp Panel by RT-PCR (Flu A&B, Covid) Nasopharyngeal Swab     Status: Abnormal   Collection Time: 04/06/20 11:21 AM   Specimen: Nasopharyngeal Swab; Nasopharyngeal(NP) swabs in vial transport medium  Result Value Ref Range Status   SARS Coronavirus 2 by RT PCR POSITIVE (A) NEGATIVE Final    Comment: RESULT CALLED TO, READ BACK BY AND VERIFIED WITH: ISABELLA LAPIETRA 04/06/20 AT 1346 BY ACR (NOTE) SARS-CoV-2 target nucleic acids are DETECTED.  The SARS-CoV-2 RNA is generally detectable in upper respiratory specimens during the acute phase of infection. Positive results are indicative of the presence of the identified virus, but do not rule out bacterial infection or co-infection with other pathogens not detected by the test. Clinical correlation with patient history and other diagnostic information is necessary to determine patient infection status. The expected result is Negative.  Fact Sheet for  Patients: BloggerCourse.com  Fact Sheet for Healthcare Providers: SeriousBroker.it  This test is not yet approved or cleared by the [place] FDA and  has been authorized for detection and/or diagnosis of SARS-CoV-2 by FDA under an Emergency Use Authorization (EUA).  This EUA will remain in effect (meaning this test  can be used) for the duration of  the COVID-19 declaration under Section 564(b)(1) of the Act, 21 U.S.C. section 360bbb-3(b)(1), unless the authorization is terminated or revoked sooner.     Influenza A by PCR NEGATIVE NEGATIVE Final   Influenza B by PCR NEGATIVE NEGATIVE Final    Comment: (NOTE) The Xpert Xpress SARS-CoV-2/FLU/RSV plus assay is intended as an aid in the diagnosis of influenza from Nasopharyngeal swab specimens and should not be used as a sole basis for treatment. Nasal washings and aspirates are unacceptable for Xpert Xpress SARS-CoV-2/FLU/RSV testing.  Fact Sheet for  Patients: BloggerCourse.com  Fact Sheet for Healthcare Providers: SeriousBroker.it  This test is not yet approved or cleared by the [place] FDA and has been authorized for detection and/or diagnosis of SARS-CoV-2 by FDA under an Emergency Use Authorization (EUA). This EUA will remain in effect (meaning this test can be used) for the duration of the COVID-19 declaration under Section 564(b)(1) of the Act, 21 U.S.C. section 360bbb-3(b)(1), unless the authorization is terminated or revoked.  Performed at [facility], [address] [place], [place] [address]   Blood culture (routine x 2)     Status: None   Collection Time: 04/06/20 12:10 PM   Specimen: Left Antecubital; Blood  Result Value Ref Range Status   Specimen Description LEFT ANTECUBITAL  Final   Special Requests   Final    BOTTLES DRAWN AEROBIC AND ANAEROBIC Blood Culture adequate volume   Culture    Final    NO GROWTH 5 DAYS Performed at [facility], [address], [place], [place] [address]    Report Status 04/11/2020 FINAL  Final  Urine culture     Status: Abnormal   Collection Time: 04/06/20 12:13 PM   Specimen: In/Out Cath Urine  Result Value Ref Range Status   Specimen Description   Final    IN/OUT CATH URINE Performed at [facility], [address] [place], [place] [address]    Special Requests   Final    NONE Performed at [facility], [address] [place], [place] [address]    Culture (A)  Final    20,000 COLONIES/mL ENTEROCOCCUS FAECIUM VANCOMYCIN RESISTANT ENTEROCOCCUS ISOLATED    Report Status 04/08/2020 FINAL  Final   Organism ID, Bacteria ENTEROCOCCUS FAECIUM (A)  Final      Susceptibility   Enterococcus faecium - MIC*    AMPICILLIN >=32 RESISTANT Resistant     NITROFURANTOIN 64 INTERMEDIATE Intermediate     VANCOMYCIN >=32 RESISTANT Resistant     LINEZOLID 2 SENSITIVE Sensitive     * 20,000 COLONIES/mL ENTEROCOCCUS FAECIUM  Blood culture (routine x 2)     Status: None   Collection Time: 04/06/20 12:15 PM   Specimen: BLOOD LEFT FOREARM  Result Value Ref Range Status   Specimen Description BLOOD LEFT FOREARM  Final   Special Requests   Final    BOTTLES DRAWN AEROBIC AND ANAEROBIC Blood Culture results may not be optimal due to an excessive volume of blood received in culture bottles   Culture   Final    NO GROWTH 5 DAYS Performed at [facility], [address] [place], [place] [address]    Report Status 04/11/2020 FINAL  Final  MRSA PCR Screening     Status: Abnormal   Collection Time: 04/07/20  9:47 AM   Specimen: Nasal Mucosa; Nasopharyngeal  Result Value Ref Range Status   MRSA by PCR POSITIVE (A) NEGATIVE Final    Comment:        The GeneXpert MRSA Assay (FDA approved for NASAL specimens only), is one component of a comprehensive MRSA colonization surveillance program. It is not intended to  diagnose MRSA infection nor to guide or monitor treatment for MRSA infections. RESULT CALLED TO, READ BACK BY AND VERIFIED WITH:  [facility] FIELDS AT 1132 04/07/20 SDR Performed at [facility], [address] [place], [place] [address]   Culture, Respiratory w Gram Stain     Status: None   Collection Time: 04/07/20 11:39 AM   Specimen: Tracheal Aspirate; Respiratory  Result Value Ref Range Status   Specimen Description   Final    TRACHEAL ASPIRATE Performed at [facility], [address] [place], [place] [address]    Special Requests   Final    NONE Performed at [facility], [address] [place], [place] [address]    Gram Stain   Final    ABUNDANT WBC PRESENT, PREDOMINANTLY PMN FEW GRAM POSITIVE COCCI Performed at [facility], [address] [place], [place] [address]    Culture RARE METHICILLIN RESISTANT STAPHYLOCOCCUS AUREUS  Final   Report Status 04/10/2020 FINAL  Final   Organism ID, Bacteria METHICILLIN RESISTANT STAPHYLOCOCCUS AUREUS  Final      Susceptibility   Methicillin resistant staphylococcus aureus - MIC*    CIPROFLOXACIN 4 RESISTANT Resistant     ERYTHROMYCIN >=8 RESISTANT Resistant     GENTAMICIN <=0.5 SENSITIVE Sensitive     OXACILLIN >=4 RESISTANT Resistant     TETRACYCLINE <=1 SENSITIVE Sensitive     VANCOMYCIN 1 SENSITIVE Sensitive     TRIMETH/SULFA <=10 SENSITIVE Sensitive     CLINDAMYCIN <=0.25 SENSITIVE Sensitive     RIFAMPIN <=0.5 SENSITIVE Sensitive     Inducible Clindamycin NEGATIVE Sensitive     * RARE METHICILLIN RESISTANT STAPHYLOCOCCUS AUREUS         Radiology Studies: No results found.      Scheduled Meds: . aspirin  81 mg Oral Daily  . atorvastatin  40 mg Oral Daily  . Chlorhexidine Gluconate Cloth  6 each Topical Daily  . DULoxetine  20 mg Oral Daily  . enoxaparin (LOVENOX) injection  185 mg Subcutaneous Q12H  . furosemide  40 mg Intravenous Daily  . gabapentin  300 mg Oral TID  .  guaiFENesin  1,200 mg Oral BID  . hydrocerin   Topical Daily  . insulin aspart  0-20 Units Subcutaneous TID PC & HS  . Ipratropium-Albuterol  1 puff Inhalation Q6H  . levothyroxine  75 mcg Oral Q0600  . linezolid  600 mg Oral Q12H  . lisinopril  2.5 mg Oral Daily  . mouth rinse  15 mL Mouth Rinse BID  . methylPREDNISolone (SOLU-MEDROL) injection  40 mg Intravenous Q12H  . metoprolol tartrate  25 mg Oral BID  . montelukast  10 mg Oral QHS  . multivitamin with minerals  1 tablet Oral Daily  . traZODone  150 mg Oral QHS  . warfarin  10 mg Oral ONCE-1600  . Warfarin - Pharmacist Dosing Inpatient   Does not apply q1600   Continuous Infusions: . sodium chloride 250 mL (04/13/20 2057)     LOS: 10 days    Time spent: 33 mins     Charise Killian, MD [facility] Pager [phone]  If 7PM-7AM, please contact night-coverage 04/16/2020, 8:15 AM

## 2020-04-17 DIAGNOSIS — I5023 Acute on chronic systolic (congestive) heart failure: Secondary | ICD-10-CM | POA: Diagnosis not present

## 2020-04-17 DIAGNOSIS — J9601 Acute respiratory failure with hypoxia: Secondary | ICD-10-CM | POA: Diagnosis not present

## 2020-04-17 DIAGNOSIS — R092 Respiratory arrest: Secondary | ICD-10-CM | POA: Diagnosis not present

## 2020-04-17 LAB — CBC WITH DIFFERENTIAL/PLATELET
Abs Immature Granulocytes: 0.19 10*3/uL — ABNORMAL HIGH (ref 0.00–0.07)
Basophils Absolute: 0 10*3/uL (ref 0.0–0.1)
Basophils Relative: 0 %
Eosinophils Absolute: 0 10*3/uL (ref 0.0–0.5)
Eosinophils Relative: 0 %
HCT: 37.1 % — ABNORMAL LOW (ref 39.0–52.0)
Hemoglobin: 11.8 g/dL — ABNORMAL LOW (ref 13.0–17.0)
Immature Granulocytes: 2 %
Lymphocytes Relative: 4 %
Lymphs Abs: 0.4 10*3/uL — ABNORMAL LOW (ref 0.7–4.0)
MCH: 31.2 pg (ref 26.0–34.0)
MCHC: 31.8 g/dL (ref 30.0–36.0)
MCV: 98.1 fL (ref 80.0–100.0)
Monocytes Absolute: 0.6 10*3/uL (ref 0.1–1.0)
Monocytes Relative: 6 %
Neutro Abs: 9.5 10*3/uL — ABNORMAL HIGH (ref 1.7–7.7)
Neutrophils Relative %: 88 %
Platelets: 202 10*3/uL (ref 150–400)
RBC: 3.78 MIL/uL — ABNORMAL LOW (ref 4.22–5.81)
RDW: 17.6 % — ABNORMAL HIGH (ref 11.5–15.5)
WBC: 10.7 10*3/uL — ABNORMAL HIGH (ref 4.0–10.5)
nRBC: 0 % (ref 0.0–0.2)

## 2020-04-17 LAB — PROTIME-INR
INR: 1.3 — ABNORMAL HIGH (ref 0.8–1.2)
Prothrombin Time: 16 seconds — ABNORMAL HIGH (ref 11.4–15.2)

## 2020-04-17 LAB — C-REACTIVE PROTEIN: CRP: 0.8 mg/dL (ref <1.0)

## 2020-04-17 LAB — D-DIMER, QUANTITATIVE: D-Dimer, Quant: 0.86 ug/mL-FEU — ABNORMAL HIGH (ref 0.00–0.50)

## 2020-04-17 LAB — GLUCOSE, CAPILLARY
Glucose-Capillary: 164 mg/dL — ABNORMAL HIGH (ref 70–99)
Glucose-Capillary: 201 mg/dL — ABNORMAL HIGH (ref 70–99)
Glucose-Capillary: 198 mg/dL — ABNORMAL HIGH (ref 70–99)
Glucose-Capillary: 172 mg/dL — ABNORMAL HIGH (ref 70–99)

## 2020-04-17 LAB — PHOSPHORUS: Phosphorus: 4.8 mg/dL — ABNORMAL HIGH (ref 2.5–4.6)

## 2020-04-17 LAB — MAGNESIUM: Magnesium: 2.2 mg/dL (ref 1.7–2.4)

## 2020-04-17 LAB — COMPREHENSIVE METABOLIC PANEL
ALT: 71 U/L — ABNORMAL HIGH (ref 0–44)
AST: 51 U/L — ABNORMAL HIGH (ref 15–41)
Albumin: 3 g/dL — ABNORMAL LOW (ref 3.5–5.0)
Alkaline Phosphatase: 69 U/L (ref 38–126)
Anion gap: 10 (ref 5–15)
BUN: 42 mg/dL — ABNORMAL HIGH (ref 6–20)
CO2: 35 mmol/L — ABNORMAL HIGH (ref 22–32)
Calcium: 9.1 mg/dL (ref 8.9–10.3)
Chloride: 91 mmol/L — ABNORMAL LOW (ref 98–111)
Creatinine, Ser: 0.95 mg/dL (ref 0.61–1.24)
GFR, Estimated: >60 mL/min (ref >60)
Glucose, Bld: 216 mg/dL — ABNORMAL HIGH (ref 70–99)
Potassium: 4.4 mmol/L (ref 3.5–5.1)
Sodium: 136 mmol/L (ref 135–145)
Total Bilirubin: 1.1 mg/dL (ref 0.3–1.2)
Total Protein: 6.1 g/dL — ABNORMAL LOW (ref 6.5–8.1)

## 2020-04-17 LAB — HEPARIN ANTI-XA: Heparin LMW: 1.89 IU/mL

## 2020-04-17 MED ORDER — CYCLOBENZAPRINE HCL 10 MG PO TABS
5.0000 mg | ORAL_TABLET | Freq: Two times a day (BID) | ORAL | Status: DC | PRN
  Administered 2020-04-17 – 2020-04-20 (×4): 5 mg via ORAL
  Filled 2020-04-17 (×5): qty 1

## 2020-04-17 MED ORDER — ENOXAPARIN SODIUM 150 MG/ML ~~LOC~~ SOLN
130.0000 mg | Freq: Two times a day (BID) | SUBCUTANEOUS | Status: DC
  Administered 2020-04-18: 130 mg via SUBCUTANEOUS
  Filled 2020-04-17 (×2): qty 0.86

## 2020-04-17 MED ORDER — ENOXAPARIN SODIUM 150 MG/ML ~~LOC~~ SOLN
130.0000 mg | Freq: Two times a day (BID) | SUBCUTANEOUS | Status: DC
  Filled 2020-04-17: qty 0.86

## 2020-04-17 MED ORDER — WARFARIN SODIUM 6 MG PO TABS
12.0000 mg | ORAL_TABLET | Freq: Once | ORAL | Status: AC
  Administered 2020-04-17: 12 mg via ORAL
  Filled 2020-04-17: qty 2

## 2020-04-17 NOTE — Progress Notes (Signed)
Attempted to perform daily dressing changes on bilateral legs. However patient refused, stated they don't need changing today, maybe tomorrow

## 2020-04-17 NOTE — Progress Notes (Signed)
ANTICOAGULATION CONSULT NOTE  Pharmacy Consult for Warfarin Indication: atrial fibrillation  Patient Measurements:  Weight:   03/3: 206.4 kg 03/2: 204.1 kg 03/1: 204.6 kg 2/28: 185.1 kg 2/27: 186 kg 2/27: 190.5 kg  Labs: Recent Labs    04/15/20 0645 04/16/20 0540 04/17/20 0330  HGB 12.4* 11.6* 11.8*  HCT 37.9* 35.3* 37.1*  PLT 210 170 202  LABPROT 15.2 15.2 16.0*  INR 1.2 1.2 1.3*  CREATININE  --  0.80 0.95    Estimated Creatinine Clearance: 146.4 mL/min (by C-G formula based on SCr of 0.95 mg/dL).   Medical History: Past Medical History:  Diagnosis Date   Allergy    Anxiety    Arthritis    Asthma    Brain damage    Chronic pain of both knees 07/13/2018   Clotting disorder (HCC)    COPD (chronic obstructive pulmonary disease) (HCC)    Depression    GERD (gastroesophageal reflux disease)    HFrEF (heart failure with reduced ejection fraction) (HCC)    a. 03/2018 Echo: EF 25-30%, diff HK. Mod LAE.   History of DVT (deep vein thrombosis)    History of pulmonary embolism    a. Chronic coumadin.   Hypertension    MI (myocardial infarction) (HCC)    Morbid obesity (HCC)    Neck pain 07/13/2018   NICM (nonischemic cardiomyopathy) (HCC)    a. s/p Cath x 3 - reportedly nl cors. Last cath 2019 in [place]; b. a. 03/2018 Echo: EF 25-30%, diff HK.   Persistent atrial fibrillation (HCC)    a. 03/2018 s/p DCCV; b. CHA2DS2VASc = 1-->Xarelto (later changed to warfarin); c. 05/2018 recurrent afib-->Amio initiated.   Sleep apnea    Sleep apnea     Assessment: 60 year old male presented with SOB. Patient with h/o atrial fibrillation on warfarin PTA admitted with a subtherapeutic INR. Pharmacy was consulted for heparin for afib. Thrombocytopenia on admission but improved. HIT antigen and SRA is negative. INR is 3.6 - elevated due to the argatroban. Once its stopped INR should normalize. Pt was subtherapeutic on admission at 1.6. Home regimen for patient may not be  appropriate.  Pharmacy is consulted to re-initiate warfarin and bridge until therapeutic with Lovenox. CHA2DS2-VASc = 6 (CHF, HTN, diabetes, VTE, CAD). Medical history includes "clotting disorder" but un-able to find previous thrombophilia work-up. Other risk factors include COVID-19 infection and obesity. Recent CTA in 12/2019, 01/2020, and 03/2020 without evidence of PE. 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   Home dose warfarin: 5 mg daily  Date INR Plan  2/28 3.6  Warfarin 7.5 mg   3/1 1.2 Warfarin 7.5 mg  3/2 1.2 Warfarin 10 mg  3/3 1.3 Warfarin 12 mg   Date 4-hr anti-Xa Comment Plan  3/1 N/A Bridge started Lovenox 185 mg q12h  3/3 1.89 Suprathera. Decrease 30%  Lovenox 130 mg q12h    Goal of Therapy:  Warfarin: INR 2 -3  Lovenox: Goal 4-hour anti-Xa level 0.6 - 1   Plan:   Lovenox --Adjust Lovenox to 130 mg q12h (30% decrease) based on supratherapeutic level. Next dose tomorrow AM --Will check 4-hr anti-Xa level before AM dose and 4-hours after AM dose. Goal trough < 0.5. Goal peak 0.6 - 1. Adjust as indicated --Bridge until therapeutic x 2 days and at least 5 days after re-initiation of warfarin  Warfarin: --INR is subtherapeutic and has trended up only marginally from yesterday. Will give warfarin  12 mg tonight --Daily INR per protocol  Tressie Ellis 04/17/2020 8:27 AM

## 2020-04-17 NOTE — Progress Notes (Signed)
Physical Therapy Treatment Patient Details Name: [name] [name] MRN: [id number] DOB: 1960/06/13 Today's Date: 04/17/2020    History of Present Illness Patient is a 60 year old male with supra morbid obesity, obesity hypoventilation syndrome and chronic hypoxic respiratory failure due to the same who presented to [facility] ED on 04/06/2020 due to increased shortness of breath 4 hours prior to admission. He had been on his CPAP prior to presenting to the emergency room but found no relief from it. Patient was being evaluated in the emergency room and was placed on BiPAP. However shortly after being placed on BiPAP he became unresponsive and pulseless.  He was noted to be in asystole and received ACLS protocols for approximately 10 minutes until ROSC was obtained. He was intubated during the procedure, and transfered to ICU intubated and mechanically ventilated.    PT Comments    Pt was long sitting in bed upon arriving. " Why did you wake me up?I haven't slept for 5 days." Author apologized to pt and reintroduced oneself from previous admission. " I don't want to get up right now." Max encouragement to participate. Eventually pt agrees however therapist question's pts effort given through. He attempted standing at EOB from elevated bed height however unable to achieve full upright standing. After 2nd attempt pt attempts to return to supine." I'm done trying to get up today.Can you come back tomorrow?" Therapist assisted pt (max assist) to return to supine and reposition in bed. Discussed need for SNF at DC however pt states," I'll be fine by Monday to go home. I don't need rehab." Acute PT will continue efforts to progress pt per POC.    Follow Up Recommendations  SNF;Other (comment) (pt currently unwilling to DC to SNF " I'll be fine by monday to go home.")     Equipment Recommendations  Other (comment) (defer to next level of care)    Recommendations for Other Services       Precautions /  Restrictions Precautions Precautions: Fall Restrictions Weight Bearing Restrictions: No    Mobility  Bed Mobility Overal bed mobility: Needs Assistance Bed Mobility: Rolling;Sit to Supine;Supine to Sit Rolling: Mod assist   Supine to sit: Mod assist;HOB elevated Sit to supine: Max assist        Transfers Overall transfer level: Needs assistance Equipment used: Rolling walker (2 wheeled) Transfers: Sit to/from Stand Sit to Stand: Max assist;From elevated surface         General transfer comment: pt attempted to stand 2 x however gives poor effort both attempts. max encouragement to stand however pt requested to return to bed and be left alone.  Ambulation/Gait      General Gait Details: unable      Balance Overall balance assessment: Needs assistance Sitting-balance support: Feet supported Sitting balance-Leahy Scale: Fair Sitting balance - Comments: no LOB seated EOB with feet support only   Standing balance support: Bilateral upper extremity supported;During functional activity Standing balance-Leahy Scale: Poor Standing balance comment: unable to stand however author questions pt's effort         Cognition Arousal/Alertness: Awake/alert Behavior During Therapy: Agitated;WFL for tasks assessed/performed Overall Cognitive Status: Within Functional Limits for tasks assessed        General Comments: Pt is alert and oriented however resistive to OOB activity. required max encouragement to participate.             Pertinent Vitals/Pain Pain Assessment: No/denies pain Faces Pain Scale: No hurt  PT Goals (current goals can now be found in the care plan section) Acute Rehab PT Goals Patient Stated Goal: go home Progress towards PT goals: Not progressing toward goals - comment (unwilling to fully participate this session)    Frequency    Min 2X/week      PT Plan Current plan remains appropriate       AM-PAC PT "6 Clicks" Mobility    Outcome Measure  Help needed turning from your back to your side while in a flat bed without using bedrails?: A Lot Help needed moving from lying on your back to sitting on the side of a flat bed without using bedrails?: A Lot Help needed moving to and from a bed to a chair (including a wheelchair)?: Total Help needed standing up from a chair using your arms (e.g., wheelchair or bedside chair)?: Total Help needed to walk in hospital room?: Total Help needed climbing 3-5 steps with a railing? : Total 6 Click Score: 8    End of Session Equipment Utilized During Treatment: Oxygen Activity Tolerance: Patient limited by fatigue Patient left: in bed;with call bell/phone within reach;with bed alarm set Nurse Communication: Mobility status PT Visit Diagnosis: Other abnormalities of gait and mobility (R26.89);Muscle weakness (generalized) (M62.81);Difficulty in walking, not elsewhere classified (R26.2);Unsteadiness on feet (R26.81)     Time: 1320-1406 PT Time Calculation (min) (ACUTE ONLY): 46 min  Charges:  $Therapeutic Activity: 38-52 mins                     Jetta Lout PTA 04/17/20, 4:08 PM

## 2020-04-17 NOTE — Progress Notes (Addendum)
PROGRESS NOTE    HPI was taken from Dr. Jayme Cloud: Patient is a 60 year old male with supra morbid obesity, obesity hypoventilation syndrome and chronic hypoxic respiratory failure due to the same who presented to [facility] ED due to increased shortness of breath 4 hours prior to admission. His past medical history is as noted below. Per history he had myalgias and cough. He had been on his CPAP prior to presenting to the emergency room but found no relief from it.  Patient was being evaluated in the emergency room and was placed on BiPAP.  However shortly after being placed on BiPAP he became unresponsive and pulseless.  He was noted to be in asystole and received ACLS protocols for approximately 10 minutes until ROSC was obtained.  He was intubated during the procedure.  He is now intubated and mechanically ventilated.  No further history can be obtained.  In the ED laboratory data has no surface patient is COVID 19 positive.   Hospital course from Dr. Mayford Knife 2/28-04/17/20: By the time I saw the pt, the pt was already out of the ICU. Pt is on steroids, zyvox and lasix. Pt was found to have COVID19 pneumonia, superimposed MRSA pneumonia & pulmonary vascular congestion. Pt evidently had cardiac arrest which was believed to be respiratory arrest as per ICU. Pt is finally back to his baseline oxygen level of 2-3L [place]. PT/OT recs SNF but pt is refusing SNF. Pt is currently too weak to be d/c home. Pt lives w/ roommate only.      [name] [name]  DGU:[id number] DOB: 05-25-1960 DOA: 04/06/2020 PCP: [name], DO   Assessment & Plan:   Active Problems:   Acute respiratory failure (HCC)   Respiratory arrest (HCC)   Acute on chronic hypoxic & hypercapnic respiratory failure: back to baseline oxygen level. Secondary to COVID19 pneumonia, superimposed MRSA pneumonia & pulmonary vascular congestion. Continue on zyvox, steroids & lasix. CTA chest was neg for PE.  Cardiac arrest: w/ asystole.  ROSC obtained in 10 mins. Suspect respiratory arrest.   DM2: likely new onset. HbA1c 6.6, well controlled. Continue on SSI w/ accuchecks. DM coordinator following   Transaminitis: etiology unclear, labile   Elevated troponins: likely secondary to demand ischemia   A. fib: likely PAF, w/ intermittent RVR. Continue on metoprolol, warfarin. Continue w/ bridge w/ lovenox   Acute on chronic systolic CHF: echo shows EF 30-35%, normal diastolic function. Continue on IV lasix. Approx 2.3 L neg fluid balance. Strict I/Os.   UTI: secondary to enterococcus faecium. Continue on zyvox   Hyponatremia: resolved  Morbid obesity: BMI 69. Complicates overall care and prognosis   Generalized weakness: PT/OT recs SNF but pt refuses SNF    DVT prophylaxis: warfarin  Code Status: full  Family Communication:  Disposition Plan: likely d/c to SNF   Level of care: Med-Surg   Status is: Inpatient  Remains inpatient appropriate because:Unsafe d/c plan and Inpatient level of care appropriate due to severity of illness, PT/OT recs SNF but pt refuses. Pt too weak to go home safely.    Dispo: The patient is from: Home              Anticipated d/c is to: Home              Patient currently is not medically stable to d/c.   Difficult to place patient Yes     Consultants:     Procedures:    Antimicrobials: zyvox    Subjective: Pt c/o pain all  over   Objective: Vitals:   04/16/20 2022 04/17/20 0403 04/17/20 0410 04/17/20 0759  BP: (!) 135/98 (!) 107/52  (!) 178/87  Pulse: 99 63  79  Resp: (!) 22 19  16   Temp: 97.6 F (36.4 C) 97.9 F (36.6 C)  97.9 F (36.6 C)  TempSrc: Oral Oral    SpO2: 98% 95%  100%  Weight:   (!) 206.4 kg   Height:        Intake/Output Summary (Last 24 hours) at 04/17/2020 0813 Last data filed at 04/17/2020 0400 Gross per 24 hour  Intake 1320 ml  Output 3700 ml  Net -2380 ml   Filed Weights   04/15/20 0547 04/16/20 0512 04/17/20 0410  Weight: (!) 204.6 kg  (!) 204.1 kg (!) 206.4 kg    Examination:  General exam: Morbidly obese. Appears uncomfortable  Respiratory system: decreased breath sounds b/l  Cardiovascular system: irregularly irregular. No rubs or clicks Gastrointestinal system: Abd is obese, NT & hypoactive bowel sounds  Central nervous system: Alert and oriented. Moves all 4 extremities Psychiatry: Judgement and insight appear normal. Flat mood and affect    Data Reviewed: I have personally reviewed following labs and imaging studies  CBC: Recent Labs  Lab 04/13/20 0417 04/14/20 0502 04/15/20 0645 04/16/20 0540 04/17/20 0330  WBC 8.9 7.0 10.4 8.8 10.7*  NEUTROABS 4.8 5.3 8.7* 7.5 9.5*  HGB 12.3* 13.1 12.4* 11.6* 11.8*  HCT 38.6* 40.3 37.9* 35.3* 37.1*  MCV 97.2 96.2 96.2 97.0 98.1  PLT 185 187 210 170 202   Basic Metabolic Panel: Recent Labs  Lab 04/12/20 0350 04/13/20 0417 04/14/20 0502 04/15/20 0645 04/16/20 0540 04/17/20 0330  NA 136 135 136  --  136 136  K 4.2 3.6 4.0  --  4.0 4.4  CL 94* 93* 92*  --  92* 91*  CO2 32 32 32  --  34* 35*  GLUCOSE 235* 150* 177*  --  171* 216*  BUN 49* 44* 37*  --  40* 42*  CREATININE 0.96 0.81 0.80  --  0.80 0.95  CALCIUM 8.2* 8.9 8.9  --  8.8* 9.1  MG 2.2 2.2 2.0 2.2 2.2 2.2  PHOS 2.2* 2.8 4.3 3.9 4.4 4.8*   GFR: Estimated Creatinine Clearance: 146.4 mL/min (by C-G formula based on SCr of 0.95 mg/dL). Liver Function Tests: Recent Labs  Lab 04/11/20 0516 04/12/20 0350 04/13/20 0417 04/16/20 0540 04/17/20 0330  AST 183* 141* 73* 33 51*  ALT 86* 84* 67* 53* 71*  ALKPHOS 82 85 76 64 69  BILITOT 2.1* 1.6* 1.7* 1.0 1.1  PROT 6.9 6.4* 6.1* 5.6* 6.1*  ALBUMIN 3.0* 2.7* 2.7* 2.7* 3.0*   No results for input(s): LIPASE, AMYLASE in the last 168 hours. No results for input(s): AMMONIA in the last 168 hours. Coagulation Profile: Recent Labs  Lab 04/15/20 0645 04/16/20 0540 04/17/20 0330  INR 1.2 1.2 1.3*   Cardiac Enzymes: No results for input(s): CKTOTAL,  CKMB, CKMBINDEX, TROPONINI in the last 168 hours. BNP (last 3 results) No results for input(s): PROBNP in the last 8760 hours. HbA1C: Recent Labs    04/15/20 0500  HGBA1C 6.6*   CBG: Recent Labs  Lab 04/16/20 0819 04/16/20 1117 04/16/20 1623 04/16/20 2016 04/17/20 0758  GLUCAP 156* 174* 172* 182* 164*   Lipid Profile: No results for input(s): CHOL, HDL, LDLCALC, TRIG, CHOLHDL, LDLDIRECT in the last 72 hours. Thyroid Function Tests: No results for input(s): TSH, T4TOTAL, FREET4, T3FREE, THYROIDAB in the last 72  hours. Anemia Panel: No results for input(s): VITAMINB12, FOLATE, FERRITIN, TIBC, IRON, RETICCTPCT in the last 72 hours. Sepsis Labs: No results for input(s): PROCALCITON, LATICACIDVEN in the last 168 hours.  Recent Results (from the past 240 hour(s))  MRSA PCR Screening     Status: Abnormal   Collection Time: 04/07/20  9:47 AM   Specimen: Nasal Mucosa; Nasopharyngeal  Result Value Ref Range Status   MRSA by PCR POSITIVE (A) NEGATIVE Final    Comment:        The GeneXpert MRSA Assay (FDA approved for NASAL specimens only), is one component of a comprehensive MRSA colonization surveillance program. It is not intended to diagnose MRSA infection nor to guide or monitor treatment for MRSA infections. RESULT CALLED TO, READ BACK BY AND VERIFIED WITH:  [facility] FIELDS AT 1132 04/07/20 SDR Performed at [facility], [address] [place], [place] [address]   Culture, Respiratory w Gram Stain     Status: None   Collection Time: 04/07/20 11:39 AM   Specimen: Tracheal Aspirate; Respiratory  Result Value Ref Range Status   Specimen Description   Final    TRACHEAL ASPIRATE Performed at [facility], [address], [place], [place] [address]    Special Requests   Final    NONE Performed at [facility], [address] [place], [place] [address]    Gram Stain   Final    ABUNDANT WBC PRESENT, PREDOMINANTLY PMN FEW GRAM POSITIVE  COCCI Performed at [facility], [address] [place], [place] [address]    Culture RARE METHICILLIN RESISTANT STAPHYLOCOCCUS AUREUS  Final   Report Status 04/10/2020 FINAL  Final   Organism ID, Bacteria METHICILLIN RESISTANT STAPHYLOCOCCUS AUREUS  Final      Susceptibility   Methicillin resistant staphylococcus aureus - MIC*    CIPROFLOXACIN 4 RESISTANT Resistant     ERYTHROMYCIN >=8 RESISTANT Resistant     GENTAMICIN <=0.5 SENSITIVE Sensitive     OXACILLIN >=4 RESISTANT Resistant     TETRACYCLINE <=1 SENSITIVE Sensitive     VANCOMYCIN 1 SENSITIVE Sensitive     TRIMETH/SULFA <=10 SENSITIVE Sensitive     CLINDAMYCIN <=0.25 SENSITIVE Sensitive     RIFAMPIN <=0.5 SENSITIVE Sensitive     Inducible Clindamycin NEGATIVE Sensitive     * RARE METHICILLIN RESISTANT STAPHYLOCOCCUS AUREUS         Radiology Studies: No results found.      Scheduled Meds:  aspirin  81 mg Oral Daily   atorvastatin  40 mg Oral Daily   Chlorhexidine Gluconate Cloth  6 each Topical Daily   DULoxetine  20 mg Oral Daily   enoxaparin (LOVENOX) injection  185 mg Subcutaneous Q12H   furosemide  40 mg Intravenous Daily   gabapentin  300 mg Oral TID   guaiFENesin  1,200 mg Oral BID   hydrocerin   Topical Daily   insulin aspart  0-20 Units Subcutaneous TID PC & HS   Ipratropium-Albuterol  1 puff Inhalation Q6H   levothyroxine  75 mcg Oral Q0600   linezolid  600 mg Oral Q12H   lisinopril  2.5 mg Oral Daily   mouth rinse  15 mL Mouth Rinse BID   methylPREDNISolone (SOLU-MEDROL) injection  40 mg Intravenous Q12H   metoprolol tartrate  25 mg Oral BID   montelukast  10 mg Oral QHS   multivitamin with minerals  1 tablet Oral Daily   traZODone  150 mg Oral QHS   Warfarin - Pharmacist Dosing  Inpatient   Does not apply q1600   Continuous Infusions:  sodium chloride 250 mL (04/13/20 2057)     LOS: 11 days    Time spent: 35 mins     Charise Killian, MD [facility] Pager [phone]  If 7PM-7AM, please contact night-coverage 04/17/2020, 8:13 AM

## 2020-04-18 DIAGNOSIS — R092 Respiratory arrest: Secondary | ICD-10-CM | POA: Diagnosis not present

## 2020-04-18 DIAGNOSIS — J9601 Acute respiratory failure with hypoxia: Secondary | ICD-10-CM | POA: Diagnosis not present

## 2020-04-18 LAB — COMPREHENSIVE METABOLIC PANEL
ALT: 91 U/L — ABNORMAL HIGH (ref 0–44)
AST: 53 U/L — ABNORMAL HIGH (ref 15–41)
Albumin: 3 g/dL — ABNORMAL LOW (ref 3.5–5.0)
Alkaline Phosphatase: 61 U/L (ref 38–126)
Anion gap: 7 (ref 5–15)
BUN: 39 mg/dL — ABNORMAL HIGH (ref 6–20)
CO2: 37 mmol/L — ABNORMAL HIGH (ref 22–32)
Calcium: 9.1 mg/dL (ref 8.9–10.3)
Chloride: 93 mmol/L — ABNORMAL LOW (ref 98–111)
Creatinine, Ser: 0.79 mg/dL (ref 0.61–1.24)
GFR, Estimated: >60 mL/min (ref >60)
Glucose, Bld: 193 mg/dL — ABNORMAL HIGH (ref 70–99)
Potassium: 4.9 mmol/L (ref 3.5–5.1)
Sodium: 137 mmol/L (ref 135–145)
Total Bilirubin: 0.8 mg/dL (ref 0.3–1.2)
Total Protein: 5.9 g/dL — ABNORMAL LOW (ref 6.5–8.1)

## 2020-04-18 LAB — GLUCOSE, CAPILLARY
Glucose-Capillary: 128 mg/dL — ABNORMAL HIGH (ref 70–99)
Glucose-Capillary: 117 mg/dL — ABNORMAL HIGH (ref 70–99)
Glucose-Capillary: 280 mg/dL — ABNORMAL HIGH (ref 70–99)
Glucose-Capillary: 154 mg/dL — ABNORMAL HIGH (ref 70–99)

## 2020-04-18 LAB — CBC WITH DIFFERENTIAL/PLATELET
Abs Immature Granulocytes: 0.19 10*3/uL — ABNORMAL HIGH (ref 0.00–0.07)
Basophils Absolute: 0 10*3/uL (ref 0.0–0.1)
Basophils Relative: 0 %
Eosinophils Absolute: 0 10*3/uL (ref 0.0–0.5)
Eosinophils Relative: 0 %
HCT: 36.7 % — ABNORMAL LOW (ref 39.0–52.0)
Hemoglobin: 11.7 g/dL — ABNORMAL LOW (ref 13.0–17.0)
Immature Granulocytes: 2 %
Lymphocytes Relative: 4 %
Lymphs Abs: 0.5 10*3/uL — ABNORMAL LOW (ref 0.7–4.0)
MCH: 31.6 pg (ref 26.0–34.0)
MCHC: 31.9 g/dL (ref 30.0–36.0)
MCV: 99.2 fL (ref 80.0–100.0)
Monocytes Absolute: 0.6 10*3/uL (ref 0.1–1.0)
Monocytes Relative: 6 %
Neutro Abs: 9.4 10*3/uL — ABNORMAL HIGH (ref 1.7–7.7)
Neutrophils Relative %: 88 %
Platelets: 204 10*3/uL (ref 150–400)
RBC: 3.7 MIL/uL — ABNORMAL LOW (ref 4.22–5.81)
RDW: 17.7 % — ABNORMAL HIGH (ref 11.5–15.5)
WBC: 10.7 10*3/uL — ABNORMAL HIGH (ref 4.0–10.5)
nRBC: 0 % (ref 0.0–0.2)

## 2020-04-18 LAB — HEPARIN ANTI-XA
Heparin LMW: 0.9 IU/mL
Heparin LMW: 1.3 IU/mL
Heparin LMW: 0.74 IU/mL

## 2020-04-18 LAB — PHOSPHORUS: Phosphorus: 4 mg/dL (ref 2.5–4.6)

## 2020-04-18 LAB — D-DIMER, QUANTITATIVE: D-Dimer, Quant: 0.58 ug/mL-FEU — ABNORMAL HIGH (ref 0.00–0.50)

## 2020-04-18 LAB — PROTIME-INR
INR: 1.5 — ABNORMAL HIGH (ref 0.8–1.2)
Prothrombin Time: 17.2 seconds — ABNORMAL HIGH (ref 11.4–15.2)

## 2020-04-18 LAB — C-REACTIVE PROTEIN: CRP: 0.8 mg/dL (ref <1.0)

## 2020-04-18 LAB — MAGNESIUM: Magnesium: 2 mg/dL (ref 1.7–2.4)

## 2020-04-18 MED ORDER — PREDNISONE 20 MG PO TABS
30.0000 mg | ORAL_TABLET | Freq: Every day | ORAL | Status: AC
  Administered 2020-04-21 – 2020-04-22 (×2): 30 mg via ORAL
  Filled 2020-04-18 (×2): qty 2

## 2020-04-18 MED ORDER — ENOXAPARIN SODIUM 100 MG/ML ~~LOC~~ SOLN
100.0000 mg | Freq: Two times a day (BID) | SUBCUTANEOUS | Status: DC
  Filled 2020-04-18 (×2): qty 1

## 2020-04-18 MED ORDER — PREDNISONE 20 MG PO TABS
20.0000 mg | ORAL_TABLET | Freq: Every day | ORAL | Status: AC
  Administered 2020-04-23 – 2020-04-24 (×2): 20 mg via ORAL
  Filled 2020-04-18 (×2): qty 1

## 2020-04-18 MED ORDER — ENOXAPARIN SODIUM 40 MG/0.4ML ~~LOC~~ SOLN: 40.0000 mg | SUBCUTANEOUS | Status: DC

## 2020-04-18 MED ORDER — WARFARIN SODIUM 6 MG PO TABS
12.0000 mg | ORAL_TABLET | Freq: Once | ORAL | Status: AC
  Administered 2020-04-18: 12 mg via ORAL
  Filled 2020-04-18: qty 2

## 2020-04-18 MED ORDER — ENOXAPARIN SODIUM 100 MG/ML ~~LOC~~ SOLN
100.0000 mg | Freq: Two times a day (BID) | SUBCUTANEOUS | Status: DC
  Filled 2020-04-18: qty 1

## 2020-04-18 MED ORDER — PREDNISONE 20 MG PO TABS
40.0000 mg | ORAL_TABLET | Freq: Every day | ORAL | Status: AC
  Administered 2020-04-19 – 2020-04-20 (×2): 40 mg via ORAL
  Filled 2020-04-18 (×2): qty 2

## 2020-04-18 NOTE — Progress Notes (Signed)
Physical Therapy Treatment Patient Details Name: Jonnie Truxillo MRN: [id number] DOB: [birth year] Today's Date: 04/18/2020    History of Present Illness Patient is a 60 year old male with supra morbid obesity, obesity hypoventilation syndrome and chronic hypoxic respiratory failure due to the same who presented to [facility] ED on 04/06/2020 due to increased shortness of breath 4 hours prior to admission. He had been on his CPAP prior to presenting to the emergency room but found no relief from it. Patient was being evaluated in the emergency room and was placed on BiPAP. However shortly after being placed on BiPAP he became unresponsive and pulseless.  He was noted to be in asystole and received ACLS protocols for approximately 10 minutes until ROSC was obtained. He was intubated during the procedure, and transfered to ICU intubated and mechanically ventilated.    PT Comments    PT/OT co-treat 2/2 to assistance required to mobilize and complexity of case. He was seated EOB upon arriving. Was able to stand to bariatric RW with +2 assist + bed height elevated + max vcs. Pt is very unsafe and somewhat impulsive at times. Throughout session, Thereasa Parkin constantly discussed safety concerns with plan to DC home. Highly recommend DC to SNF for safety. Pt still resistive but considering at least. He will greatly benefit form continued skilled PT to address deficits while improving safety with ADLs.    Follow Up Recommendations  SNF;Supervision for mobility/OOB;Supervision/Assistance - 24 hour     Equipment Recommendations  Other (comment) (defer to next level of care)       Precautions / Restrictions Precautions Precautions: Fall Restrictions Weight Bearing Restrictions: No    Mobility  Bed Mobility    General bed mobility comments: Pt was seated EOB upon arriving    Transfers Overall transfer level: Needs assistance Equipment used: Rolling walker (2 wheeled) Transfers: Sit to/from Stand Sit to  Stand: Max assist;From elevated surface;Mod assist;+2 physical assistance;+2 safety/equipment         General transfer comment: Pt stood 2 x EOB with +2 assist for safety. Pt need max encouragement and cues for technique  Ambulation/Gait Ambulation/Gait assistance: Max assist;+2 physical assistance;+2 safety/equipment Gait Distance (Feet): 3 Feet Assistive device: Rolling walker (2 wheeled) Gait Pattern/deviations: Step-to pattern Gait velocity: decreased   General Gait Details: Pt was able to take 3 steps to turn to recliner.       Balance Overall balance assessment: Needs assistance Sitting-balance support: Feet supported Sitting balance-Leahy Scale: Fair Sitting balance - Comments: no LOB seated EOB with feet support only   Standing balance support: Bilateral upper extremity supported;During functional activity Standing balance-Leahy Scale: Poor Standing balance comment: poor standing balance and endurance         Cognition Arousal/Alertness: Awake/alert Behavior During Therapy: WFL for tasks assessed/performed Overall Cognitive Status: Within Functional Limits for tasks assessed        General Comments: Pt was seated EOB. Agrees to PT session and eager for OOB to chair             Pertinent Vitals/Pain Pain Assessment: No/denies pain Faces Pain Scale: No hurt           PT Goals (current goals can now be found in the care plan section) Acute Rehab PT Goals Patient Stated Goal: go home Progress towards PT goals: Progressing toward goals    Frequency    Min 2X/week      PT Plan Current plan remains appropriate       AM-PAC PT "6 Clicks" Mobility  Outcome Measure  Help needed turning from your back to your side while in a flat bed without using bedrails?: A Lot Help needed moving from lying on your back to sitting on the side of a flat bed without using bedrails?: A Lot Help needed moving to and from a bed to a chair (including a wheelchair)?:  Total Help needed standing up from a chair using your arms (e.g., wheelchair or bedside chair)?: Total Help needed to walk in hospital room?: Total Help needed climbing 3-5 steps with a railing? : Total 6 Click Score: 8    End of Session Equipment Utilized During Treatment: Oxygen Activity Tolerance: Patient limited by fatigue Patient left: in chair;with call bell/phone within reach;with chair alarm set Nurse Communication: Mobility status PT Visit Diagnosis: Other abnormalities of gait and mobility (R26.89);Muscle weakness (generalized) (M62.81);Difficulty in walking, not elsewhere classified (R26.2);Unsteadiness on feet (R26.81)     Time: 0263-7858 PT Time Calculation (min) (ACUTE ONLY): 24 min  Charges:  $Therapeutic Activity: 8-22 mins                     Jetta Lout PTA 04/18/20, 1:22 PM

## 2020-04-18 NOTE — Progress Notes (Signed)
ANTICOAGULATION CONSULT NOTE  Pharmacy Consult for Warfarin Indication: atrial fibrillation  Patient Measurements:  Weight:  03/4: 195.5 kg 03/3: 206.4 kg 03/2: 204.1 kg 03/1: 204.6 kg 2/28: 185.1 kg 2/27: 186 kg 2/27: 190.5 kg  Labs: Recent Labs    04/16/20 0540 04/17/20 0330 04/17/20 1308 04/18/20 0530 04/18/20 1207 04/18/20 2100  HGB 11.6* 11.8*  --  11.7*  --   --   HCT 35.3* 37.1*  --  36.7*  --   --   PLT 170 202  --  204  --   --   LABPROT 15.2 16.0*  --  17.2*  --   --   INR 1.2 1.3*  --  1.5*  --   --   HEPRLOWMOCWT  --   --    < > 0.90 1.30 0.74  CREATININE 0.80 0.95  --  0.79  --   --    < > = values in this interval not displayed.    Estimated Creatinine Clearance: 167.6 mL/min (by C-G formula based on SCr of 0.79 mg/dL).   Medical History: Past Medical History:  Diagnosis Date  . Allergy   . Anxiety   . Arthritis   . Asthma   . Brain damage   . Chronic pain of both knees 07/13/2018  . Clotting disorder (HCC)   . COPD (chronic obstructive pulmonary disease) (HCC)   . Depression   . GERD (gastroesophageal reflux disease)   . HFrEF (heart failure with reduced ejection fraction) (HCC)    a. 03/2018 Echo: EF 25-30%, diff HK. Mod LAE.  [name] History of DVT (deep vein thrombosis)   . History of pulmonary embolism    a. Chronic coumadin.  [name] Hypertension   . MI (myocardial infarction) (HCC)   . Morbid obesity (HCC)   . Neck pain 07/13/2018  . NICM (nonischemic cardiomyopathy) (HCC)    a. s/p Cath x 3 - reportedly nl cors. Last cath 2019 in [place]; b. a. 03/2018 Echo: EF 25-30%, diff HK.  [name] Persistent atrial fibrillation (HCC)    a. 03/2018 s/p DCCV; b. CHA2DS2VASc = 1-->Xarelto (later changed to warfarin); c. 05/2018 recurrent afib-->Amio initiated.  . Sleep apnea   . Sleep apnea     Assessment: 60 year old male presented with SOB. Patient with h/o atrial fibrillation on warfarin PTA admitted with a subtherapeutic INR. Pharmacy was consulted for heparin for  afib. Thrombocytopenia on admission but improved. HIT antigen and SRA is negative. INR is 3.6 - elevated due to the argatroban. Once its stopped INR should normalize. Pt was subtherapeutic on admission at 1.6. Home regimen for patient may not be appropriate.  Pharmacy is consulted to re-initiate warfarin and bridge until therapeutic with Lovenox. CHA2DS2-VASc = 6 (CHF, HTN, diabetes, VTE, CAD). Medical history includes "clotting disorder" but un-able to find previous thrombophilia work-up. Other risk factors include COVID-19 infection and obesity. Recent CTA in 12/2019, 01/2020, and 03/2020 without evidence of PE. 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   Home dose warfarin: 5 mg daily  Date INR Plan  2/28 3.6  Warfarin 7.5 mg   3/1 1.2 Warfarin 7.5 mg  3/2 1.2 Warfarin 10 mg  3/3 1.3 Warfarin 12 mg  3/4 1.5 Warfarin 12 mg   Date 4-hr anti-Xa Comment Plan  3/1 N/A Bridge started Lovenox 185 mg q12h  3/3 1.89 Suprathera. Decrease 30%  Lovenox 130 mg q12h  3/4 1.3 (7 hr lvl)  Suprathera. Decrease 20% Lovenox 100 mg q12h    Goal of Therapy:  Warfarin: INR 2 -3  Lovenox: Goal 4-hour anti-Xa level 0.6 - 1   Plan:   Lovenox --Adjust Lovenox to 100 mg q12h (20% decrease) based on supratherapeutic level. Next dose tonight and delayed --Will check trough anti-Xa level before tonight's dose Goal trough < 0.5. Delay further if indicated --Bridge until therapeutic x 2 days and at least 5 days after re-initiation of warfarin. Anticipate Lovenox discontinuation in next 1-2 days  3/4:  Anti-XA @ 2100 = 0.74 Will hold lovenox dose for now and repeat anti-Xa in 4 hrs on 3/5 @ 0300.   Warfarin: --INR is subtherapeutic. Will give warfarin 12 mg tonight --Daily INR per protocol --Suspect patient will require modification of home regimen prior to discharge based on inpatient dosing thus far  Otniel Hoe D 04/18/2020 11:23 PM

## 2020-04-18 NOTE — Progress Notes (Signed)
ANTICOAGULATION CONSULT NOTE  Pharmacy Consult for Warfarin Indication: atrial fibrillation  Patient Measurements:  Weight:  03/4: 195.5 kg 03/3: 206.4 kg 03/2: 204.1 kg 03/1: 204.6 kg 2/28: 185.1 kg 2/27: 186 kg 2/27: 190.5 kg  Labs: Recent Labs    04/16/20 0540 04/17/20 0330 04/17/20 1308 04/18/20 0530  HGB 11.6* 11.8*  --  11.7*  HCT 35.3* 37.1*  --  36.7*  PLT 170 202  --  204  LABPROT 15.2 16.0*  --  17.2*  INR 1.2 1.3*  --  1.5*  HEPRLOWMOCWT  --   --  1.89 0.90  CREATININE 0.80 0.95  --  0.79    Estimated Creatinine Clearance: 167.6 mL/min (by C-G formula based on SCr of 0.79 mg/dL).   Medical History: Past Medical History:  Diagnosis Date  . Allergy   . Anxiety   . Arthritis   . Asthma   . Brain damage   . Chronic pain of both knees 07/13/2018  . Clotting disorder (HCC)   . COPD (chronic obstructive pulmonary disease) (HCC)   . Depression   . GERD (gastroesophageal reflux disease)   . HFrEF (heart failure with reduced ejection fraction) (HCC)    a. 03/2018 Echo: EF 25-30%, diff HK. Mod LAE.  [name] History of DVT (deep vein thrombosis)   . History of pulmonary embolism    a. Chronic coumadin.  [name] Hypertension   . MI (myocardial infarction) (HCC)   . Morbid obesity (HCC)   . Neck pain 07/13/2018  . NICM (nonischemic cardiomyopathy) (HCC)    a. s/p Cath x 3 - reportedly nl cors. Last cath 2019 in [place]; b. a. 03/2018 Echo: EF 25-30%, diff HK.  [name] Persistent atrial fibrillation (HCC)    a. 03/2018 s/p DCCV; b. CHA2DS2VASc = 1-->Xarelto (later changed to warfarin); c. 05/2018 recurrent afib-->Amio initiated.  . Sleep apnea   . Sleep apnea     Assessment: 61 year old male presented with SOB. Patient with h/o atrial fibrillation on warfarin PTA admitted with a subtherapeutic INR. Pharmacy was consulted for heparin for afib. Thrombocytopenia on admission but improved. HIT antigen and SRA is negative. INR is 3.6 - elevated due to the argatroban. Once its stopped INR  should normalize. Pt was subtherapeutic on admission at 1.6. Home regimen for patient may not be appropriate.  Pharmacy is consulted to re-initiate warfarin and bridge until therapeutic with Lovenox. CHA2DS2-VASc = 6 (CHF, HTN, diabetes, VTE, CAD). Medical history includes "clotting disorder" but un-able to find previous thrombophilia work-up. Other risk factors include COVID-19 infection and obesity. Recent CTA in 12/2019, 01/2020, and 03/2020 without evidence of PE. 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   Home dose warfarin: 5 mg daily  Date INR Plan  2/28 3.6  Warfarin 7.5 mg   3/1 1.2 Warfarin 7.5 mg  3/2 1.2 Warfarin 10 mg  3/3 1.3 Warfarin 12 mg  3/4 1.5 Warfarin 12 mg   Date 4-hr anti-Xa Comment Plan  3/1 N/A Bridge started Lovenox 185 mg q12h  3/3 1.89 Suprathera. Decrease 30%  Lovenox 130 mg q12h  3/4 1.3 (7 hr lvl) Suprathera. Decrease 20% Lovenox 100 mg q12h    Goal of Therapy:  Warfarin: INR 2 -3  Lovenox: Goal 4-hour anti-Xa level 0.6 - 1   Plan:   Lovenox --Adjust Lovenox to 100 mg q12h (20% decrease) based on supratherapeutic level. Next dose tonight and delayed --Will check trough anti-Xa level before  tonight's dose Goal trough < 0.5. Delay further if indicated --Bridge until therapeutic x 2 days and at least 5 days after re-initiation of warfarin. Anticipate Lovenox discontinuation in next 1-2 days  Warfarin: --INR is subtherapeutic. Will give warfarin 12 mg tonight --Daily INR per protocol --Suspect patient will require modification of home regimen prior to discharge based on inpatient dosing thus far  Tressie Ellis 04/18/2020 8:22 AM

## 2020-04-18 NOTE — Progress Notes (Signed)
PROGRESS NOTE    HPI was taken from Dr. Jayme Cloud: Patient is a 60 year old male with supra morbid obesity, obesity hypoventilation syndrome and chronic hypoxic respiratory failure due to the same who presented to [facility] ED due to increased shortness of breath 4 hours prior to admission. His past medical history is as noted below. Per history he had myalgias and cough. He had been on his CPAP prior to presenting to the emergency room but found no relief from it.  Patient was being evaluated in the emergency room and was placed on BiPAP.  However shortly after being placed on BiPAP he became unresponsive and pulseless.  He was noted to be in asystole and received ACLS protocols for approximately 10 minutes until ROSC was obtained.  He was intubated during the procedure.  He is now intubated and mechanically ventilated.  No further history can be obtained.  In the ED laboratory data has no surface patient is COVID 19 positive.   Hospital course from Dr. Mayford Knife 2/28-04/17/20: By the time I saw the pt, the pt was already out of the ICU. Pt is on steroids, zyvox and lasix. Pt was found to have COVID19 pneumonia, superimposed MRSA pneumonia & pulmonary vascular congestion. Pt evidently had cardiac arrest which was believed to be respiratory arrest as per ICU. Pt is finally back to his baseline oxygen level of 2-3L . PT/OT recs SNF but pt is refusing SNF. Pt is currently too weak to be d/c home. Pt lives w/ roommate only.      [name] [name]  ZOX:[id number] DOB: [birth year] DOA: 04/06/2020 PCP: [name], DO   Assessment & Plan:   Active Problems:   Acute respiratory failure (HCC)   Respiratory arrest (HCC)   Acute on chronic hypoxic & hypercapnic respiratory failure:  back to baseline oxygen level. Secondary to COVID19 pneumonia, superimposed MRSA pneumonia & pulmonary vascular congestion.  CTA chest was neg for PE. --cont IV lasix --cont prednisone taper  Cardiac arrest: w/  asystole. ROSC obtained in 10 mins. Suspect respiratory arrest.   DM2: likely new onset. HbA1c 6.6, well controlled.  --SSI  Transaminitis: etiology unclear, labile   Elevated troponins: likely secondary to demand ischemia   A. fib: likely PAF, w/ intermittent RVR. Continue on metoprolol, warfarin.  --no need for bridging, d/c treatment-dose Lovenox  Acute on chronic systolic CHF: echo shows EF 30-35%, normal diastolic function.  --cont IV lasix Strict I/Os.   UTI: secondary to enterococcus faecium. S/p zyvox   Hyponatremia: resolved  Morbid obesity: BMI 69. Complicates overall care and prognosis   Generalized weakness: PT/OT recs SNF, however, pt's insurance doesn't pay for SNF rehab nor HH.   DVT prophylaxis: lovenox Code Status: full  Family Communication:  Disposition Plan: Home    Level of care: Med-Surg   Status is: Inpatient  Remains inpatient appropriate because:Unsafe d/c plan and Inpatient level of care appropriate due to severity of illness, PT/OT recs SNF, however, pt's insurance doesn't pay for SNF rehab nor HH.  Pt is too weak to go home currently.  Dispo: The patient is from: Home              Anticipated d/c is to: Home              Patient currently is not medically stable to d/c.   Difficult to place patient Yes   Consultants:     Procedures:    Antimicrobials: zyvox    Subjective: Pt complained of pain in his right  shoulder and believed he had a torn rotator cuff from reaching up to grab a blanket.  Complained of feeling dizzy while just sitting.  Has been voiding a lot.    Objective: Vitals:   04/18/20 0523 04/18/20 0525 04/18/20 0828 04/18/20 1131  BP:  134/88 123/84 (!) 145/93  Pulse:  (!) 48 94 66  Resp:  20 19 18   Temp:  98.1 F (36.7 C) 98.4 F (36.9 C) 98.6 F (37 C)  TempSrc:    Oral  SpO2:  98% 96% 96%  Weight: (!) 195.5 kg     Height:        Intake/Output Summary (Last 24 hours) at 04/18/2020 1635 Last data filed at  04/17/2020 1750 Gross per 24 hour  Intake 570 ml  Output 800 ml  Net -230 ml   Filed Weights   04/16/20 0512 04/17/20 0410 04/18/20 0523  Weight: (!) 204.1 kg (!) 206.4 kg (!) 195.5 kg    Examination:  Constitutional: NAD, AAOx3 HEENT: conjunctivae and lids normal, EOMI CV: No cyanosis.   RESP: normal respiratory effort, on 3L Extremities: tense edema in BLE SKIN: warm, dry Neuro: II - XII grossly intact.   Foley present   Data Reviewed: I have personally reviewed following labs and imaging studies  CBC: Recent Labs  Lab 04/14/20 0502 04/15/20 0645 04/16/20 0540 04/17/20 0330 04/18/20 0530  WBC 7.0 10.4 8.8 10.7* 10.7*  NEUTROABS 5.3 8.7* 7.5 9.5* 9.4*  HGB 13.1 12.4* 11.6* 11.8* 11.7*  HCT 40.3 37.9* 35.3* 37.1* 36.7*  MCV 96.2 96.2 97.0 98.1 99.2  PLT 187 210 170 202 204   Basic Metabolic Panel: Recent Labs  Lab 04/13/20 0417 04/14/20 0502 04/15/20 0645 04/16/20 0540 04/17/20 0330 04/18/20 0530  NA 135 136  --  136 136 137  K 3.6 4.0  --  4.0 4.4 4.9  CL 93* 92*  --  92* 91* 93*  CO2 32 32  --  34* 35* 37*  GLUCOSE 150* 177*  --  171* 216* 193*  BUN 44* 37*  --  40* 42* 39*  CREATININE 0.81 0.80  --  0.80 0.95 0.79  CALCIUM 8.9 8.9  --  8.8* 9.1 9.1  MG 2.2 2.0 2.2 2.2 2.2 2.0  PHOS 2.8 4.3 3.9 4.4 4.8* 4.0   GFR: Estimated Creatinine Clearance: 167.6 mL/min (by C-G formula based on SCr of 0.79 mg/dL). Liver Function Tests: Recent Labs  Lab 04/12/20 0350 04/13/20 0417 04/16/20 0540 04/17/20 0330 04/18/20 0530  AST 141* 73* 33 51* 53*  ALT 84* 67* 53* 71* 91*  ALKPHOS 85 76 64 69 61  BILITOT 1.6* 1.7* 1.0 1.1 0.8  PROT 6.4* 6.1* 5.6* 6.1* 5.9*  ALBUMIN 2.7* 2.7* 2.7* 3.0* 3.0*   No results for input(s): LIPASE, AMYLASE in the last 168 hours. No results for input(s): AMMONIA in the last 168 hours. Coagulation Profile: Recent Labs  Lab 04/15/20 0645 04/16/20 0540 04/17/20 0330 04/18/20 0530  INR 1.2 1.2 1.3* 1.5*   Cardiac  Enzymes: No results for input(s): CKTOTAL, CKMB, CKMBINDEX, TROPONINI in the last 168 hours. BNP (last 3 results) No results for input(s): PROBNP in the last 8760 hours. HbA1C: No results for input(s): HGBA1C in the last 72 hours. CBG: Recent Labs  Lab 04/17/20 1112 04/17/20 1643 04/17/20 2040 04/18/20 0825 04/18/20 1127  GLUCAP 201* 198* 172* 128* 117*   Lipid Profile: No results for input(s): CHOL, HDL, LDLCALC, TRIG, CHOLHDL, LDLDIRECT in the last 72 hours. Thyroid Function Tests:  No results for input(s): TSH, T4TOTAL, FREET4, T3FREE, THYROIDAB in the last 72 hours. Anemia Panel: No results for input(s): VITAMINB12, FOLATE, FERRITIN, TIBC, IRON, RETICCTPCT in the last 72 hours. Sepsis Labs: No results for input(s): PROCALCITON, LATICACIDVEN in the last 168 hours.  No results found for this or any previous visit (from the past 240 hour(s)).       Radiology Studies: No results found.      Scheduled Meds: . aspirin  81 mg Oral Daily  . atorvastatin  40 mg Oral Daily  . Chlorhexidine Gluconate Cloth  6 each Topical Daily  . DULoxetine  20 mg Oral Daily  . enoxaparin (LOVENOX) injection  100 mg Subcutaneous Q12H  . furosemide  40 mg Intravenous Daily  . gabapentin  300 mg Oral TID  . guaiFENesin  1,200 mg Oral BID  . hydrocerin   Topical Daily  . insulin aspart  0-20 Units Subcutaneous TID PC & HS  . Ipratropium-Albuterol  1 puff Inhalation Q6H  . levothyroxine  75 mcg Oral Q0600  . lisinopril  2.5 mg Oral Daily  . mouth rinse  15 mL Mouth Rinse BID  . methylPREDNISolone (SOLU-MEDROL) injection  40 mg Intravenous Q12H  . metoprolol tartrate  25 mg Oral BID  . montelukast  10 mg Oral QHS  . multivitamin with minerals  1 tablet Oral Daily  . [START ON 04/19/2020] predniSONE  40 mg Oral Q breakfast   Followed by  . [START ON 04/21/2020] predniSONE  30 mg Oral Q breakfast   Followed by  . [START ON 04/23/2020] predniSONE  20 mg Oral Q breakfast  . traZODone  150 mg  Oral QHS  . warfarin  12 mg Oral ONCE-1600  . Warfarin - Pharmacist Dosing Inpatient   Does not apply q1600   Continuous Infusions: . sodium chloride 250 mL (04/13/20 2057)     LOS: 12 days    Time spent: 35 mins     Darlin Priestly, MD [facility] Pager [phone]  If 7PM-7AM, please contact night-coverage 04/18/2020, 4:35 PM

## 2020-04-18 NOTE — Progress Notes (Signed)
Occupational Therapy Treatment Patient Details Name: [name] [name] MRN: [id number] DOB: April 08, 1960 Today's Date: 04/18/2020    History of present illness Patient is a 60 year old male with supra morbid obesity, obesity hypoventilation syndrome and chronic hypoxic respiratory failure due to the same who presented to [facility] ED on 04/06/2020 due to increased shortness of breath 4 hours prior to admission. He had been on his CPAP prior to presenting to the emergency room but found no relief from it. Patient was being evaluated in the emergency room and was placed on BiPAP. However shortly after being placed on BiPAP he became unresponsive and pulseless.  He was noted to be in asystole and received ACLS protocols for approximately 10 minutes until ROSC was obtained. He was intubated during the procedure, and transfered to ICU intubated and mechanically ventilated.   OT comments  [name] [name] seen for OT/PT co-treatment on this date. Upon arrival to room pt seated EOB agreeable to OOB to chair.  SUPERVISION self-drinking sitting EOB - fair balance reaching inside BOS. MAX A for LBD sitting EOB. MAX A x2 + RW + MAX cues for SPT t/f. Pt reports that it is the walker limiting him and that once he is home he will be able to mobilize - demonstrates poor insight into deficits. Currently refusing SNF, reinforced the need for extensive rehab prior to return home. Pt making progress toward goals. Pt continues to benefit from skilled OT services to maximize return to PLOF and minimize risk of future falls, injury, caregiver burden, and readmission. Will continue to follow POC. Discharge recommendation remains appropriate.    Follow Up Recommendations  SNF    Equipment Recommendations       Recommendations for Other Services      Precautions / Restrictions Precautions Precautions: Fall Restrictions Weight Bearing Restrictions: No       Mobility Bed Mobility               General bed mobility comments:  Pt was seated EOB upon arriving    Transfers Overall transfer level: Needs assistance Equipment used: Rolling walker (2 wheeled) Transfers: Sit to/from Stand Sit to Stand: Max assist;From elevated surface;Mod assist;+2 physical assistance;+2 safety/equipment         General transfer comment: Pt stood 2 x EOB with +2 assist for safety. Pt need max encouragement and cues for technique    Balance Overall balance assessment: Needs assistance Sitting-balance support: Feet supported Sitting balance-Leahy Scale: Fair Sitting balance - Comments: no LOB seated EOB with feet support only   Standing balance support: Bilateral upper extremity supported;During functional activity Standing balance-Leahy Scale: Poor Standing balance comment: poor standing balance and endurance                           ADL either performed or assessed with clinical judgement   ADL Overall ADL's : Needs assistance/impaired                                       General ADL Comments: SUPERVISION grooming sitting EOB - fair balance reaching inside BOS. MAX A for LBD sitting EOB. MAX A x2 + RW for ADL t/f               Cognition Arousal/Alertness: Awake/alert Behavior During Therapy: WFL for tasks assessed/performed Overall Cognitive Status: Within Functional Limits for tasks assessed  General Comments: Pt is alert and oriented however resistive to OOB activity. required max encouragement to participate.        Exercises Exercises: Other exercises Other Exercises Other Exercises: Pt educated re: OT role, DME recs, d/c recs, falls prevention Other Exercises: LBD, grooming, sit<>stand x2, SPT, sitting/standing balance/tolerance           Pertinent Vitals/ Pain       Pain Assessment: No/denies pain Faces Pain Scale: No hurt         Frequency  Min 1X/week        Progress Toward Goals  OT Goals(current goals can now  be found in the care plan section)  Progress towards OT goals: Progressing toward goals  Acute Rehab OT Goals Patient Stated Goal: go home OT Goal Formulation: With patient Time For Goal Achievement: 04/28/20 Potential to Achieve Goals: Fair ADL Goals Pt Will Perform Grooming: with min guard assist;sitting Pt Will Perform Upper Body Dressing: with min guard assist;sitting Additional ADL Goal #1: Pt will safely transfer from bed to chair, using LRAD, with Min A  Plan Discharge plan remains appropriate;Frequency remains appropriate    Co-evaluation    PT/OT/SLP Co-Evaluation/Treatment: Yes Reason for Co-Treatment: For patient/therapist safety;To address functional/ADL transfers PT goals addressed during session: Mobility/safety with mobility OT goals addressed during session: ADL's and self-care      AM-PAC OT "6 Clicks" Daily Activity     Outcome Measure   Help from another person eating meals?: None Help from another person taking care of personal grooming?: A Little Help from another person toileting, which includes using toliet, bedpan, or urinal?: Total Help from another person bathing (including washing, rinsing, drying)?: Total Help from another person to put on and taking off regular upper body clothing?: A Lot Help from another person to put on and taking off regular lower body clothing?: A Lot 6 Click Score: 13    End of Session    OT Visit Diagnosis: Unsteadiness on feet (R26.81);Repeated falls (R29.6);Muscle weakness (generalized) (M62.81)   Activity Tolerance Patient tolerated treatment well   Patient Left in chair;with call bell/phone within reach;with chair alarm set   Nurse Communication          Time: 9326-7124 OT Time Calculation (min): 24 min  Charges: OT General Charges $OT Visit: 1 Visit OT Treatments $Self Care/Home Management : 8-22 mins  Kathie Dike, M.S. OTR/L  04/18/20, 1:32 PM  ascom [phone]

## 2020-04-18 NOTE — TOC Progression Note (Addendum)
Transition of Care [facility]) - Progression Note    Patient Details  Name: [name] [name] MRN: [id number] Date of Birth: April 19, 1960  Transition of Care [facility]) CM/SW Contact  Maree Krabbe, LCSW Phone Number: 04/18/2020, 1:25 PM  Clinical Narrative:   CSW has reached out to several home health agencies to see if they ca service pt. [facility] has declined, and [facility] is reviewing, waiting on other responses. [facility] declined because they have serviced pt before and he was non compliant. [facility] can not service pt. [facility] can not service.         Expected Discharge Plan and Services                                                 Social Determinants of Health (SDOH) Interventions    Readmission Risk Interventions Readmission Risk Prevention Plan 01/11/2020 12/24/2019 01/25/2019  Transportation Screening Complete Complete Complete  PCP or Specialist Appt within 3-5 Days - - -  HRI or Home Care Consult - - -  Social Work Consult for Recovery Care Planning/Counseling - - -  Palliative Care Screening - - -  Medication Review [facility]) Complete Complete Complete  PCP or Specialist appointment within 3-5 days of discharge Complete Complete Complete  HRI or Home Care Consult - - Complete  SW Recovery Care/Counseling Consult Complete Complete Complete  Palliative Care Screening Complete Not Applicable Not Applicable  Skilled Nursing Facility Not Applicable Not Applicable Not Applicable

## 2020-04-19 DIAGNOSIS — J9601 Acute respiratory failure with hypoxia: Secondary | ICD-10-CM | POA: Diagnosis not present

## 2020-04-19 LAB — CBC
HCT: 36 % — ABNORMAL LOW (ref 39.0–52.0)
Hemoglobin: 11.6 g/dL — ABNORMAL LOW (ref 13.0–17.0)
MCH: 31.7 pg (ref 26.0–34.0)
MCHC: 32.2 g/dL (ref 30.0–36.0)
MCV: 98.4 fL (ref 80.0–100.0)
Platelets: 210 10*3/uL (ref 150–400)
RBC: 3.66 MIL/uL — ABNORMAL LOW (ref 4.22–5.81)
RDW: 17.9 % — ABNORMAL HIGH (ref 11.5–15.5)
WBC: 12.3 10*3/uL — ABNORMAL HIGH (ref 4.0–10.5)
nRBC: 0 % (ref 0.0–0.2)

## 2020-04-19 LAB — BASIC METABOLIC PANEL
Anion gap: 6 (ref 5–15)
BUN: 40 mg/dL — ABNORMAL HIGH (ref 6–20)
CO2: 38 mmol/L — ABNORMAL HIGH (ref 22–32)
Calcium: 9.2 mg/dL (ref 8.9–10.3)
Chloride: 92 mmol/L — ABNORMAL LOW (ref 98–111)
Creatinine, Ser: 0.81 mg/dL (ref 0.61–1.24)
GFR, Estimated: >60 mL/min (ref >60)
Glucose, Bld: 207 mg/dL — ABNORMAL HIGH (ref 70–99)
Potassium: 5 mmol/L (ref 3.5–5.1)
Sodium: 136 mmol/L (ref 135–145)

## 2020-04-19 LAB — GLUCOSE, CAPILLARY
Glucose-Capillary: 177 mg/dL — ABNORMAL HIGH (ref 70–99)
Glucose-Capillary: 132 mg/dL — ABNORMAL HIGH (ref 70–99)
Glucose-Capillary: 167 mg/dL — ABNORMAL HIGH (ref 70–99)
Glucose-Capillary: 187 mg/dL — ABNORMAL HIGH (ref 70–99)

## 2020-04-19 LAB — MAGNESIUM: Magnesium: 2.1 mg/dL (ref 1.7–2.4)

## 2020-04-19 LAB — PROTIME-INR
INR: 1.6 — ABNORMAL HIGH (ref 0.8–1.2)
Prothrombin Time: 18.1 seconds — ABNORMAL HIGH (ref 11.4–15.2)

## 2020-04-19 LAB — C-REACTIVE PROTEIN: CRP: 0.7 mg/dL (ref <1.0)

## 2020-04-19 MED ORDER — FUROSEMIDE 10 MG/ML IJ SOLN
40.0000 mg | Freq: Two times a day (BID) | INTRAMUSCULAR | Status: DC
  Administered 2020-04-19 – 2020-04-23 (×9): 40 mg via INTRAVENOUS
  Filled 2020-04-19 (×9): qty 4

## 2020-04-19 MED ORDER — APIXABAN 5 MG PO TABS
5.0000 mg | ORAL_TABLET | Freq: Two times a day (BID) | ORAL | Status: DC
  Administered 2020-04-19 – 2020-04-28 (×19): 5 mg via ORAL
  Filled 2020-04-19 (×19): qty 1

## 2020-04-19 MED ORDER — OXYCODONE-ACETAMINOPHEN 7.5-325 MG PO TABS
1.0000 | ORAL_TABLET | Freq: Four times a day (QID) | ORAL | Status: DC | PRN
  Administered 2020-04-19 – 2020-04-28 (×29): 1 via ORAL
  Filled 2020-04-19 (×29): qty 1

## 2020-04-19 MED ORDER — CARISOPRODOL 350 MG PO TABS
350.0000 mg | ORAL_TABLET | Freq: Three times a day (TID) | ORAL | Status: DC
  Administered 2020-04-19 – 2020-04-28 (×26): 350 mg via ORAL
  Filled 2020-04-19 (×28): qty 1

## 2020-04-19 NOTE — Progress Notes (Signed)
PROGRESS NOTE    HPI was taken from Dr. Jayme Cloud: Patient is a 60 year old male with supra morbid obesity, obesity hypoventilation syndrome and chronic hypoxic respiratory failure due to the same who presented to [facility] ED due to increased shortness of breath 4 hours prior to admission. His past medical history is as noted below. Per history he had myalgias and cough. He had been on his CPAP prior to presenting to the emergency room but found no relief from it.  Patient was being evaluated in the emergency room and was placed on BiPAP.  However shortly after being placed on BiPAP he became unresponsive and pulseless.  He was noted to be in asystole and received ACLS protocols for approximately 10 minutes until ROSC was obtained.  He was intubated during the procedure.  He is now intubated and mechanically ventilated.  No further history can be obtained.  In the ED laboratory data has no surface patient is COVID 19 positive.   Hospital course from Dr. Mayford Knife 2/28-04/17/20: By the time I saw the pt, the pt was already out of the ICU. Pt is on steroids, zyvox and lasix. Pt was found to have COVID19 pneumonia, superimposed MRSA pneumonia & pulmonary vascular congestion. Pt evidently had cardiac arrest which was believed to be respiratory arrest as per ICU. Pt is finally back to his baseline oxygen level of 2-3L [place]. PT/OT recs SNF but pt is refusing SNF. Pt is currently too weak to be d/c home. Pt lives w/ roommate only.      [name] [name]  [id number]:[id number] DOB: September 23, 1960 DOA: 04/06/2020 PCP: [name], DO   Assessment & Plan:   Active Problems:   Acute respiratory failure (HCC)   Respiratory arrest (HCC)   Acute on chronic hypoxic & hypercapnic respiratory failure:  back to baseline oxygen level. Secondary to COVID19 pneumonia, superimposed MRSA pneumonia & pulmonary vascular congestion.  CTA chest was neg for PE. --cont IV lasix BID --cont prednisone taper  Cardiac arrest: w/  asystole. ROSC obtained in 10 mins. Suspect respiratory arrest.   DM2: likely new onset. HbA1c 6.6, well controlled.  --SSI  Transaminitis: etiology unclear, labile   Elevated troponins: likely secondary to demand ischemia   A. fib: likely PAF, w/ intermittent RVR.  Continue on metoprolol, warfarin.  --d/c treatment-dose Lovenox, start Eliquis until INR therapeutic  Acute on chronic systolic CHF: echo shows EF 30-35%, normal diastolic function.  --cont IV lasix 40 mg BID Strict I/Os.   UTI: secondary to enterococcus faecium. S/p zyvox   Hyponatremia: resolved  Morbid obesity: BMI 69. Complicates overall care and prognosis   Hypothyroidism --cont Synthroid  Generalized weakness: PT/OT recs SNF, however, pt's insurance doesn't pay for SNF rehab nor HH.  Right shoulder pain --resume home [facility] TID, per pt request   DVT prophylaxis: lovenox Code Status: full  Family Communication:  Disposition Plan: Home    Level of care: Med-Surg   Status is: Inpatient  Remains inpatient appropriate because:Unsafe d/c plan and Inpatient level of care appropriate due to severity of illness, PT/OT recs SNF, however, pt's insurance doesn't pay for SNF rehab nor HH.  Pt is too weak to go home currently.  Dispo: The patient is from: Home              Anticipated d/c is to: Home              Patient currently is not medically stable to d/c.   Difficult to place patient Yes   Consultants:  Procedures:    Antimicrobials: zyvox    Subjective: Pt continued to complain of his rotator cuff hurting.  Reported making a lot of urine.     Objective: Vitals:   04/19/20 0122 04/19/20 0512 04/19/20 0830 04/19/20 1137  BP:  127/88 125/66 120/83  Pulse:  93 74 96  Resp:   20 19  Temp:  98.7 F (37.1 C) 98.4 F (36.9 C) 97.9 F (36.6 C)  TempSrc:  Oral    SpO2:  96% 98% 98%  Weight: (!) 208.2 kg     Height:        Intake/Output Summary (Last 24 hours) at 04/19/2020 1432 Last  data filed at 04/19/2020 0539 Gross per 24 hour  Intake -  Output 2000 ml  Net -2000 ml   Filed Weights   04/17/20 0410 04/18/20 0523 04/19/20 0122  Weight: (!) 206.4 kg (!) 195.5 kg (!) 208.2 kg    Examination:  Constitutional: NAD, AAOx3 HEENT: conjunctivae and lids normal, EOMI CV: No cyanosis.   RESP: normal respiratory effort, on 2L Extremities: edema in BLE still severe, but less tense SKIN: warm, dry Neuro: II - XII grossly intact.   Psych: Normal mood and affect.  Appropriate judgement and reason  Foley present   Data Reviewed: I have personally reviewed following labs and imaging studies  CBC: Recent Labs  Lab 04/14/20 0502 04/15/20 0645 04/16/20 0540 04/17/20 0330 04/18/20 0530 04/19/20 0545  WBC 7.0 10.4 8.8 10.7* 10.7* 12.3*  NEUTROABS 5.3 8.7* 7.5 9.5* 9.4*  --   HGB 13.1 12.4* 11.6* 11.8* 11.7* 11.6*  HCT 40.3 37.9* 35.3* 37.1* 36.7* 36.0*  MCV 96.2 96.2 97.0 98.1 99.2 98.4  PLT 187 210 170 202 204 210   Basic Metabolic Panel: Recent Labs  Lab 04/14/20 0502 04/15/20 0645 04/16/20 0540 04/17/20 0330 04/18/20 0530 04/19/20 0545  NA 136  --  136 136 137 136  K 4.0  --  4.0 4.4 4.9 5.0  CL 92*  --  92* 91* 93* 92*  CO2 32  --  34* 35* 37* 38*  GLUCOSE 177*  --  171* 216* 193* 207*  BUN 37*  --  40* 42* 39* 40*  CREATININE 0.80  --  0.80 0.95 0.79 0.81  CALCIUM 8.9  --  8.8* 9.1 9.1 9.2  MG 2.0 2.2 2.2 2.2 2.0 2.1  PHOS 4.3 3.9 4.4 4.8* 4.0  --    GFR: Estimated Creatinine Clearance: 172.6 mL/min (by C-G formula based on SCr of 0.81 mg/dL). Liver Function Tests: Recent Labs  Lab 04/13/20 0417 04/16/20 0540 04/17/20 0330 04/18/20 0530  AST 73* 33 51* 53*  ALT 67* 53* 71* 91*  ALKPHOS 76 64 69 61  BILITOT 1.7* 1.0 1.1 0.8  PROT 6.1* 5.6* 6.1* 5.9*  ALBUMIN 2.7* 2.7* 3.0* 3.0*   No results for input(s): LIPASE, AMYLASE in the last 168 hours. No results for input(s): AMMONIA in the last 168 hours. Coagulation Profile: Recent Labs   Lab 04/15/20 0645 04/16/20 0540 04/17/20 0330 04/18/20 0530 04/19/20 0545  INR 1.2 1.2 1.3* 1.5* 1.6*   Cardiac Enzymes: No results for input(s): CKTOTAL, CKMB, CKMBINDEX, TROPONINI in the last 168 hours. BNP (last 3 results) No results for input(s): PROBNP in the last 8760 hours. HbA1C: No results for input(s): HGBA1C in the last 72 hours. CBG: Recent Labs  Lab 04/18/20 1127 04/18/20 1713 04/18/20 2137 04/19/20 0827 04/19/20 1137  GLUCAP 117* 280* 154* 177* 132*   Lipid Profile: No results  for input(s): CHOL, HDL, LDLCALC, TRIG, CHOLHDL, LDLDIRECT in the last 72 hours. Thyroid Function Tests: No results for input(s): TSH, T4TOTAL, FREET4, T3FREE, THYROIDAB in the last 72 hours. Anemia Panel: No results for input(s): VITAMINB12, FOLATE, FERRITIN, TIBC, IRON, RETICCTPCT in the last 72 hours. Sepsis Labs: No results for input(s): PROCALCITON, LATICACIDVEN in the last 168 hours.  No results found for this or any previous visit (from the past 240 hour(s)).       Radiology Studies: No results found.      Scheduled Meds: . apixaban  5 mg Oral BID  . aspirin  81 mg Oral Daily  . atorvastatin  40 mg Oral Daily  . carisoprodol  350 mg Oral TID  . Chlorhexidine Gluconate Cloth  6 each Topical Daily  . DULoxetine  20 mg Oral Daily  . furosemide  40 mg Intravenous BID  . gabapentin  300 mg Oral TID  . guaiFENesin  1,200 mg Oral BID  . hydrocerin   Topical Daily  . insulin aspart  0-20 Units Subcutaneous TID PC & HS  . Ipratropium-Albuterol  1 puff Inhalation Q6H  . levothyroxine  75 mcg Oral Q0600  . lisinopril  2.5 mg Oral Daily  . mouth rinse  15 mL Mouth Rinse BID  . metoprolol tartrate  25 mg Oral BID  . montelukast  10 mg Oral QHS  . multivitamin with minerals  1 tablet Oral Daily  . predniSONE  40 mg Oral Q breakfast   Followed by  . [START ON 04/21/2020] predniSONE  30 mg Oral Q breakfast   Followed by  . [START ON 04/23/2020] predniSONE  20 mg Oral Q  breakfast  . traZODone  150 mg Oral QHS  . Warfarin - Pharmacist Dosing Inpatient   Does not apply q1600   Continuous Infusions: . sodium chloride 250 mL (04/13/20 2057)     LOS: 13 days    Time spent: 35 mins     Darlin Priestly, MD [facility] Pager [phone]  If 7PM-7AM, please contact night-coverage 04/19/2020, 2:32 PM

## 2020-04-19 NOTE — Plan of Care (Signed)

## 2020-04-19 NOTE — Progress Notes (Addendum)
Pt is complaining fo 9/10 pain on pt shoulder. Pt just have PRN meds percocet for pain at 1731. Ouma NP made aware. Will continue to monitor.  Update 2133: Per Ouma okay to give percocet early. Percocet adminstered at 2133. Will continue to monitor.

## 2020-04-19 NOTE — Progress Notes (Signed)
ANTICOAGULATION CONSULT NOTE - Initial Consult  Pharmacy Consult for Eliquis  Indication: atrial fibrillation  No Known Allergies  Patient Measurements: Height: 5\' 8"  (172.7 cm) Weight: (!) 195.5 kg (431 lb) IBW/kg (Calculated) : 68.4 Heparin Dosing Weight:    Vital Signs: Temp: 97.8 F (36.6 C) (03/04 2011) Temp Source: Oral (03/04 2011) BP: 109/84 (03/04 2011) Pulse Rate: 96 (03/04 2011)  Labs: Recent Labs    04/16/20 0540 04/17/20 0330 04/17/20 1308 04/18/20 0530 04/18/20 1207 04/18/20 2100  HGB 11.6* 11.8*  --  11.7*  --   --   HCT 35.3* 37.1*  --  36.7*  --   --   PLT 170 202  --  204  --   --   LABPROT 15.2 16.0*  --  17.2*  --   --   INR 1.2 1.3*  --  1.5*  --   --   HEPRLOWMOCWT  --   --    < > 0.90 1.30 0.74  CREATININE 0.80 0.95  --  0.79  --   --    < > = values in this interval not displayed.    Estimated Creatinine Clearance: 167.6 mL/min (by C-G formula based on SCr of 0.79 mg/dL).   Medical History: Past Medical History:  Diagnosis Date  . Allergy   . Anxiety   . Arthritis   . Asthma   . Brain damage   . Chronic pain of both knees 07/13/2018  . Clotting disorder (HCC)   . COPD (chronic obstructive pulmonary disease) (HCC)   . Depression   . GERD (gastroesophageal reflux disease)   . HFrEF (heart failure with reduced ejection fraction) (HCC)    a. 03/2018 Echo: EF 25-30%, diff HK. Mod LAE.  04/2018 History of DVT (deep vein thrombosis)   . History of pulmonary embolism    a. Chronic coumadin.  [name] Hypertension   . MI (myocardial infarction) (HCC)   . Morbid obesity (HCC)   . Neck pain 07/13/2018  . NICM (nonischemic cardiomyopathy) (HCC)    a. s/p Cath x 3 - reportedly nl cors. Last cath 2019 in [place]; b. a. 03/2018 Echo: EF 25-30%, diff HK.  04/2018 Persistent atrial fibrillation (HCC)    a. 03/2018 s/p DCCV; b. CHA2DS2VASc = 1-->Xarelto (later changed to warfarin); c. 05/2018 recurrent afib-->Amio initiated.  . Sleep apnea   . Sleep apnea      Medications:  Medications Prior to Admission  Medication Sig Dispense Refill Last Dose  . DULoxetine (CYMBALTA) 20 MG capsule Take 1 capsule (20 mg total) by mouth daily. 90 capsule 1   . levothyroxine (SYNTHROID) 75 MCG tablet Take 1 tablet (75 mcg total) by mouth daily before breakfast. 30 tablet 3   . meclizine (ANTIVERT) 25 MG tablet Take 0.5-1 tablets (12.5-25 mg total) by mouth 3 (three) times daily as needed for dizziness. 60 tablet 2 PRN  . torsemide (DEMADEX) 20 MG tablet Take 4 tablets (80 mg total) by mouth 2 (two) times daily. 60 tablet 1   . warfarin (COUMADIN) 5 MG tablet Take 5 mg by mouth daily.      03[birth year]1984 albuterol (VENTOLIN HFA) 108 (90 Base) MCG/ACT inhaler Inhale 2 puffs into the lungs every 4 (four) hours as needed for wheezing or shortness of breath. (Patient not taking: No sig reported) 8 g 2 Not Taking at Unknown time  . amiodarone (PACERONE) 200 MG tablet Take 1 tablet (200 mg total) by mouth 2 (two) times daily. (Patient not taking: Reported  on 04/06/2020) 30 tablet 1 Not Taking at Unknown time  . aspirin 81 MG chewable tablet Chew 1 tablet (81 mg total) by mouth daily. 30 tablet 1   . atorvastatin (LIPITOR) 40 MG tablet TAKE 1 TABLET BY MOUTH ONCE DAILY (Patient taking differently: Take 40 mg by mouth daily.) 90 tablet 1   . carisoprodol (SOMA) 350 MG tablet Take 1 tablet (350 mg total) by mouth 3 (three) times daily as needed for muscle spasms. (Patient not taking: No sig reported) 30 tablet 0   . gabapentin (NEURONTIN) 300 MG capsule Take 300 mg by mouth 3 (three) times daily.     [name] lisinopril (ZESTRIL) 2.5 MG tablet Take by mouth.     . loperamide (IMODIUM) 2 MG capsule Take 1 capsule (2 mg total) by mouth as needed for diarrhea or loose stools. 30 capsule 0   . magnesium oxide (MAG-OX) 400 (241.3 Mg) MG tablet Take 1 tablet (400 mg total) by mouth 2 (two) times daily. 60 tablet 3   . metoprolol succinate (TOPROL-XL) 50 MG 24 hr tablet Take 2 tablets (100 mg total) by  mouth daily. Take with or immediately following a meal. 30 tablet 4   . montelukast (SINGULAIR) 10 MG tablet Take 1 tablet (10 mg total) by mouth at bedtime. 30 tablet 11   . Multiple Vitamin (MULTIVITAMIN WITH MINERALS) TABS tablet Take 1 tablet by mouth daily.     . naphazoline-glycerin (CLEAR EYES REDNESS) 0.012-0.2 % SOLN Place 1-2 drops into both eyes 4 (four) times daily as needed for eye irritation. 15 mL 0   . nitroGLYCERIN (NITROSTAT) 0.4 MG SL tablet Place 1 tablet under tongue every 5 minutes as needed for chest pain. (No more than 3 doses within 15 minutes) 15 tablet 3   . potassium chloride (KLOR-CON) 20 MEQ packet Take 40 mEq by mouth daily. (Patient not taking: Reported on 04/06/2020) 30 each 2 Not Taking at Unknown time  . SUMAtriptan (IMITREX) 50 MG tablet Take 1 tablet (50 mg total) by mouth every 2 (two) hours as needed for migraine or headache. May repeat in 2 hours if headache persists or recurs. No more than 2 doses in a day 10 tablet 0   . thiamine 100 MG tablet Take 1 tablet (100 mg total) by mouth daily. (Patient not taking: Reported on 04/06/2020) 30 tablet 0 Not Taking at Unknown time  . traZODone (DESYREL) 150 MG tablet Take 1 tablet (150 mg total) by mouth at bedtime. 90 tablet 1     Assessment: 60 year old male presented with SOB. Patient with h/o atrial fibrillation on warfarin PTA admitted with a subtherapeutic INR. Pharmacy was consulted for heparin for afib. Thrombocytopenia on admission but improved. HIT antigen and SRA is negative. INR is 3.6 - elevated due to the argatroban. Once its stopped INR should normalize. Pt was subtherapeutic on admission at 1.6. Home regimen for patient may not be appropriate.  Pharmacy is consulted to re-initiate warfarin and bridge until therapeutic with Lovenox. CHA2DS2-VASc = 6 (CHF, HTN, diabetes, VTE, CAD). Medical history includes "clotting disorder" but un-able to find previous thrombophilia work-up. Other risk factors include  COVID-19 infection and obesity. Recent CTA in 12/2019, 01/2020, and 03/2020 without evidence of PE. 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   Home dose warfarin: 5 mg daily  Date INR Plan  2/28 3.6  Warfarin 7.5 mg   3/1 1.2 Warfarin 7.5 mg  3/2 1.2 Warfarin 10 mg  3/3 1.3 Warfarin 12 mg  3/4 1.5 Warfarin 12 mg   Date 4-hr anti-Xa Comment Plan  3/1 N/A Bridge started Lovenox 185 mg q12h  3/3 1.89 Suprathera. Decrease 30%  Lovenox 130 mg q12h  3/4 1.3 (7 hr lvl) Suprathera. Decrease 20% Lovenox 100 mg q12h    3/5:  Dr [name] Lowes wanted to d/c therapeutic lovenox and change to a prophylatic dose of lovenox 40 mg SQ Q24H to avoid unnecesary  Pain , bruising .  Reminded Dr [name] Lowes that he had past hx of VTE so she agreed to change to apixaban.   Last dose of lovenox was 130 mg on 3/4 @ 0536.   3/4:  HL @ 2100 = 0.74  Goal of Therapy:  prevention of VTE , bridging with warfarin   Plan:  Eliquis 5 mg PO BID ordered to start on 3/5 @ ~ 0100.   Kyndall Chaplin D 04/19/2020,12:48 AM

## 2020-04-20 ENCOUNTER — Inpatient Hospital Stay: Payer: Medicaid Other

## 2020-04-20 DIAGNOSIS — R092 Respiratory arrest: Secondary | ICD-10-CM | POA: Diagnosis not present

## 2020-04-20 DIAGNOSIS — J9601 Acute respiratory failure with hypoxia: Secondary | ICD-10-CM | POA: Diagnosis not present

## 2020-04-20 LAB — BASIC METABOLIC PANEL
Anion gap: 6 (ref 5–15)
BUN: 36 mg/dL — ABNORMAL HIGH (ref 6–20)
CO2: 40 mmol/L — ABNORMAL HIGH (ref 22–32)
Calcium: 8.9 mg/dL (ref 8.9–10.3)
Chloride: 91 mmol/L — ABNORMAL LOW (ref 98–111)
Creatinine, Ser: 0.68 mg/dL (ref 0.61–1.24)
GFR, Estimated: >60 mL/min (ref >60)
Glucose, Bld: 129 mg/dL — ABNORMAL HIGH (ref 70–99)
Potassium: 4.4 mmol/L (ref 3.5–5.1)
Sodium: 137 mmol/L (ref 135–145)

## 2020-04-20 LAB — GLUCOSE, CAPILLARY
Glucose-Capillary: 128 mg/dL — ABNORMAL HIGH (ref 70–99)
Glucose-Capillary: 117 mg/dL — ABNORMAL HIGH (ref 70–99)
Glucose-Capillary: 177 mg/dL — ABNORMAL HIGH (ref 70–99)
Glucose-Capillary: 157 mg/dL — ABNORMAL HIGH (ref 70–99)

## 2020-04-20 LAB — CBC
HCT: 34.4 % — ABNORMAL LOW (ref 39.0–52.0)
Hemoglobin: 11.2 g/dL — ABNORMAL LOW (ref 13.0–17.0)
MCH: 32.1 pg (ref 26.0–34.0)
MCHC: 32.6 g/dL (ref 30.0–36.0)
MCV: 98.6 fL (ref 80.0–100.0)
Platelets: 186 10*3/uL (ref 150–400)
RBC: 3.49 MIL/uL — ABNORMAL LOW (ref 4.22–5.81)
RDW: 17.9 % — ABNORMAL HIGH (ref 11.5–15.5)
WBC: 9.1 10*3/uL (ref 4.0–10.5)
nRBC: 0 % (ref 0.0–0.2)

## 2020-04-20 LAB — C-REACTIVE PROTEIN: CRP: 0.6 mg/dL (ref <1.0)

## 2020-04-20 LAB — PROTIME-INR
INR: 1.9 — ABNORMAL HIGH (ref 0.8–1.2)
Prothrombin Time: 21 seconds — ABNORMAL HIGH (ref 11.4–15.2)

## 2020-04-20 LAB — MAGNESIUM: Magnesium: 2.1 mg/dL (ref 1.7–2.4)

## 2020-04-20 LAB — TROPONIN I (HIGH SENSITIVITY): Troponin I (High Sensitivity): 26 ng/L — ABNORMAL HIGH (ref <18)

## 2020-04-20 MED ORDER — SODIUM CHLORIDE 0.9% FLUSH: 10.0000 mL | INTRAVENOUS | Status: DC | PRN

## 2020-04-20 MED ORDER — NITROGLYCERIN 0.4 MG SL SUBL
SUBLINGUAL_TABLET | SUBLINGUAL | Status: AC
  Filled 2020-04-20: qty 1

## 2020-04-20 NOTE — Progress Notes (Addendum)
Removed central line per order using [facility] policy and procedure. Patient holding breath and bearing down as instructed during removal, catheter tip in place on removal. Patient complains 5 minutes later that immediately after central line removal he started having 8/10 dull left sided chest pain. CN called to room, patient states "this is how I felt before my heart stopped." Rapid response called. MD paged.  MD/RR on scene at 1600, chest pain down to 6/10, see orders, EKG, vitals.  Awaiting troponin blood draw. By 6468 patient states chest pain completely gone.

## 2020-04-20 NOTE — Progress Notes (Signed)
   04/20/20 1604  Clinical Encounter Type  Visited With Patient  Visit Type Initial;Spiritual support  Referral From Nurse  Consult/Referral To Chaplain  Spiritual Encounters  Spiritual Needs Prayer;Emotional  Chaplain Kailei Cowens responded to an RR in room 212, Pt [name] [name]. Pt was laying down in bed with complaints of upper chest pain. Medical team assessed the Pt and Dr. [name] Lowes is enroute to do a further assessment. I provided a ministry of presence and prayer, no family members present.

## 2020-04-20 NOTE — Progress Notes (Signed)
PROGRESS NOTE    HPI was taken from Dr. Jayme Cloud: Patient is a 60 year old male with supra morbid obesity, obesity hypoventilation syndrome and chronic hypoxic respiratory failure due to the same who presented to [facility] ED due to increased shortness of breath 4 hours prior to admission. His past medical history is as noted below. Per history he had myalgias and cough. He had been on his CPAP prior to presenting to the emergency room but found no relief from it.  Patient was being evaluated in the emergency room and was placed on BiPAP.  However shortly after being placed on BiPAP he became unresponsive and pulseless.  He was noted to be in asystole and received ACLS protocols for approximately 10 minutes until ROSC was obtained.  He was intubated during the procedure.  He is now intubated and mechanically ventilated.  No further history can be obtained.  In the ED laboratory data has no surface patient is COVID 19 positive.   Hospital course from Dr. Mayford Knife 2/28-04/17/20: By the time I saw the pt, the pt was already out of the ICU. Pt is on steroids, zyvox and lasix. Pt was found to have COVID19 pneumonia, superimposed MRSA pneumonia & pulmonary vascular congestion. Pt evidently had cardiac arrest which was believed to be respiratory arrest as per ICU. Pt is finally back to his baseline oxygen level of 2-3L [place]. PT/OT recs SNF but pt is refusing SNF. Pt is currently too weak to be d/c home. Pt lives w/ roommate only.      [name] [name]  GNO:[id number] DOB: 09/20/1960 DOA: 04/06/2020 PCP: [name], DO   Assessment & Plan:   Active Problems:   Acute respiratory failure (HCC)   Respiratory arrest (HCC)   Acute on chronic hypoxic & hypercapnic respiratory failure:  back to baseline oxygen level. Secondary to COVID19 pneumonia, superimposed MRSA pneumonia & pulmonary vascular congestion.  CTA chest was neg for PE. --cont IV lasix 40 mg BID --cont prednisone taper  Cardiac  arrest: w/ asystole.  Suspect respiratory arrest.  ROSC obtained in 10 mins.   DM2: likely new onset.  HbA1c 6.6, well controlled.  --SSI  Transaminitis: etiology unclear, labile   Elevated troponins: likely secondary to demand ischemia   A. fib: likely PAF, w/ intermittent RVR.  --d/c'ed treatment-dose Lovenox, started Eliquis until INR therapeutic --Continue on metoprolol, warfarin.   Acute on chronic systolic CHF:  echo shows EF 30-35%, normal diastolic function.  --cont IV lasix 40 mg BID --Strict I/Os.   UTI: secondary to enterococcus faecium.  S/p zyvox   Hyponatremia: resolved  Morbid obesity: BMI 69.  Complicates overall care and prognosis   Hypothyroidism --cont Synthroid  Generalized weakness:  PT/OT recs SNF, however, pt's insurance doesn't pay for SNF rehab nor HH. --inpatient PT/OT  Right shoulder pain --cont home Soma TID, per pt request --xray of right shoulder   DVT prophylaxis: lovenox Code Status: full  Family Communication:  Disposition Plan: Home    Level of care: Med-Surg   Status is: Inpatient  Remains inpatient appropriate because:Unsafe d/c plan and Inpatient level of care appropriate due to severity of illness, PT/OT recs SNF, however, pt's insurance doesn't pay for SNF rehab nor HH.  Pt is too weak to go home currently.  Dispo: The patient is from: Home              Anticipated d/c is to: Home              Patient currently is  medically stable to d/c.   Difficult to place patient Yes   Consultants:     Procedures:    Antimicrobials: zyvox    Subjective: Pt continued to complain of right shoulder pain.  Soma helped.  Lots urine output with lasix BID.  Pt felt the need to be on 2-3L O2 even though sating well.   Objective: Vitals:   04/20/20 0526 04/20/20 0755 04/20/20 0846 04/20/20 1100  BP: (!) 104/55  132/85 130/63  Pulse:   88 88  Resp: 20 17 18    Temp: 98.4 F (36.9 C)  98.8 F (37.1 C) 98.9 F (37.2 C)   TempSrc: Oral  Oral Oral  SpO2: 96%  93%   Weight:      Height:        Intake/Output Summary (Last 24 hours) at 04/20/2020 1250 Last data filed at 04/20/2020 1100 Gross per 24 hour  Intake 714 ml  Output 7880 ml  Net -7166 ml   Filed Weights   04/17/20 0410 04/18/20 0523 04/19/20 0122  Weight: (!) 206.4 kg (!) 195.5 kg (!) 208.2 kg    Examination:  Constitutional: NAD, AAOx3 HEENT: conjunctivae and lids normal, EOMI CV: No cyanosis.   RESP: normal respiratory effort, on 2L Extremities: tense edema in BLE, but improved SKIN: warm, dry Neuro: II - XII grossly intact.   Psych: Normal mood and affect.    Foley present   Data Reviewed: I have personally reviewed following labs and imaging studies  CBC: Recent Labs  Lab 04/14/20 0502 04/15/20 0645 04/16/20 0540 04/17/20 0330 04/18/20 0530 04/19/20 0545 04/20/20 0507  WBC 7.0 10.4 8.8 10.7* 10.7* 12.3* 9.1  NEUTROABS 5.3 8.7* 7.5 9.5* 9.4*  --   --   HGB 13.1 12.4* 11.6* 11.8* 11.7* 11.6* 11.2*  HCT 40.3 37.9* 35.3* 37.1* 36.7* 36.0* 34.4*  MCV 96.2 96.2 97.0 98.1 99.2 98.4 98.6  PLT 187 210 170 202 204 210 186   Basic Metabolic Panel: Recent Labs  Lab 04/14/20 0502 04/15/20 0645 04/16/20 0540 04/17/20 0330 04/18/20 0530 04/19/20 0545 04/20/20 0507  NA 136  --  136 136 137 136 137  K 4.0  --  4.0 4.4 4.9 5.0 4.4  CL 92*  --  92* 91* 93* 92* 91*  CO2 32  --  34* 35* 37* 38* 40*  GLUCOSE 177*  --  171* 216* 193* 207* 129*  BUN 37*  --  40* 42* 39* 40* 36*  CREATININE 0.80  --  0.80 0.95 0.79 0.81 0.68  CALCIUM 8.9  --  8.8* 9.1 9.1 9.2 8.9  MG 2.0 2.2 2.2 2.2 2.0 2.1 2.1  PHOS 4.3 3.9 4.4 4.8* 4.0  --   --    GFR: Estimated Creatinine Clearance: 174.8 mL/min (by C-G formula based on SCr of 0.68 mg/dL). Liver Function Tests: Recent Labs  Lab 04/16/20 0540 04/17/20 0330 04/18/20 0530  AST 33 51* 53*  ALT 53* 71* 91*  ALKPHOS 64 69 61  BILITOT 1.0 1.1 0.8  PROT 5.6* 6.1* 5.9*  ALBUMIN 2.7* 3.0*  3.0*   No results for input(s): LIPASE, AMYLASE in the last 168 hours. No results for input(s): AMMONIA in the last 168 hours. Coagulation Profile: Recent Labs  Lab 04/16/20 0540 04/17/20 0330 04/18/20 0530 04/19/20 0545 04/20/20 0507  INR 1.2 1.3* 1.5* 1.6* 1.9*   Cardiac Enzymes: No results for input(s): CKTOTAL, CKMB, CKMBINDEX, TROPONINI in the last 168 hours. BNP (last 3 results) No results for input(s): PROBNP in  the last 8760 hours. HbA1C: No results for input(s): HGBA1C in the last 72 hours. CBG: Recent Labs  Lab 04/19/20 1137 04/19/20 1727 04/19/20 2047 04/20/20 0842 04/20/20 1155  GLUCAP 132* 167* 187* 128* 117*   Lipid Profile: No results for input(s): CHOL, HDL, LDLCALC, TRIG, CHOLHDL, LDLDIRECT in the last 72 hours. Thyroid Function Tests: No results for input(s): TSH, T4TOTAL, FREET4, T3FREE, THYROIDAB in the last 72 hours. Anemia Panel: No results for input(s): VITAMINB12, FOLATE, FERRITIN, TIBC, IRON, RETICCTPCT in the last 72 hours. Sepsis Labs: No results for input(s): PROCALCITON, LATICACIDVEN in the last 168 hours.  No results found for this or any previous visit (from the past 240 hour(s)).       Radiology Studies: No results found.      Scheduled Meds: . apixaban  5 mg Oral BID  . aspirin  81 mg Oral Daily  . atorvastatin  40 mg Oral Daily  . carisoprodol  350 mg Oral TID  . Chlorhexidine Gluconate Cloth  6 each Topical Daily  . DULoxetine  20 mg Oral Daily  . furosemide  40 mg Intravenous BID  . gabapentin  300 mg Oral TID  . guaiFENesin  1,200 mg Oral BID  . hydrocerin   Topical Daily  . insulin aspart  0-20 Units Subcutaneous TID PC & HS  . Ipratropium-Albuterol  1 puff Inhalation Q6H  . levothyroxine  75 mcg Oral Q0600  . lisinopril  2.5 mg Oral Daily  . mouth rinse  15 mL Mouth Rinse BID  . metoprolol tartrate  25 mg Oral BID  . montelukast  10 mg Oral QHS  . multivitamin with minerals  1 tablet Oral Daily  . [START ON  04/21/2020] predniSONE  30 mg Oral Q breakfast   Followed by  . [START ON 04/23/2020] predniSONE  20 mg Oral Q breakfast  . traZODone  150 mg Oral QHS   Continuous Infusions: . sodium chloride 250 mL (04/13/20 2057)     LOS: 14 days    Time spent: 35 mins     Darlin Priestly, MD [facility] Pager [phone]  If 7PM-7AM, please contact night-coverage 04/20/2020, 12:50 PM

## 2020-04-20 NOTE — Significant Event (Signed)
Rapid Response Event Note   Reason for Call : RRT called for CP ... recent pulling of central line   Initial Focused Assessment: Pt laying flat in bed, alert and oriented x4, VSS, c/o dull CP in left upper chest area.      Interventions: Dr [name] Lowes at bedside to assess, stat ekg, telemetry and troponin ordered. Pt reports CP subsiding before MD left bedside.    Plan of Care: as above, Dan RN and Office manager to call if further assistance needed.    Event Summary:   MD Notified: Dr [name] Lowes Call Time:1602 Arrival Time:1604 End FMBB:[id number]  Deseree Zemaitis A, RN

## 2020-04-21 LAB — BASIC METABOLIC PANEL
Anion gap: 9 (ref 5–15)
BUN: 34 mg/dL — ABNORMAL HIGH (ref 6–20)
CO2: 40 mmol/L — ABNORMAL HIGH (ref 22–32)
Calcium: 8.9 mg/dL (ref 8.9–10.3)
Chloride: 89 mmol/L — ABNORMAL LOW (ref 98–111)
Creatinine, Ser: 0.76 mg/dL (ref 0.61–1.24)
GFR, Estimated: >60 mL/min (ref >60)
Glucose, Bld: 154 mg/dL — ABNORMAL HIGH (ref 70–99)
Potassium: 4 mmol/L (ref 3.5–5.1)
Sodium: 138 mmol/L (ref 135–145)

## 2020-04-21 LAB — GLUCOSE, CAPILLARY
Glucose-Capillary: 134 mg/dL — ABNORMAL HIGH (ref 70–99)
Glucose-Capillary: 126 mg/dL — ABNORMAL HIGH (ref 70–99)
Glucose-Capillary: 187 mg/dL — ABNORMAL HIGH (ref 70–99)
Glucose-Capillary: 139 mg/dL — ABNORMAL HIGH (ref 70–99)
Glucose-Capillary: 132 mg/dL — ABNORMAL HIGH (ref 70–99)

## 2020-04-21 LAB — CBC
HCT: 35.6 % — ABNORMAL LOW (ref 39.0–52.0)
Hemoglobin: 11.4 g/dL — ABNORMAL LOW (ref 13.0–17.0)
MCH: 31.8 pg (ref 26.0–34.0)
MCHC: 32 g/dL (ref 30.0–36.0)
MCV: 99.2 fL (ref 80.0–100.0)
Platelets: 198 10*3/uL (ref 150–400)
RBC: 3.59 MIL/uL — ABNORMAL LOW (ref 4.22–5.81)
RDW: 18.2 % — ABNORMAL HIGH (ref 11.5–15.5)
WBC: 9.1 10*3/uL (ref 4.0–10.5)
nRBC: 0 % (ref 0.0–0.2)

## 2020-04-21 LAB — MAGNESIUM: Magnesium: 2 mg/dL (ref 1.7–2.4)

## 2020-04-21 LAB — PROTIME-INR
INR: 1.4 — ABNORMAL HIGH (ref 0.8–1.2)
Prothrombin Time: 16.2 seconds — ABNORMAL HIGH (ref 11.4–15.2)

## 2020-04-21 LAB — C-REACTIVE PROTEIN: CRP: 0.6 mg/dL (ref <1.0)

## 2020-04-21 MED ORDER — ENSURE MAX PROTEIN PO LIQD
11.0000 [oz_av] | Freq: Two times a day (BID) | ORAL | Status: DC
  Administered 2020-04-21 – 2020-04-28 (×12): 11 [oz_av] via ORAL
  Filled 2020-04-21: qty 330

## 2020-04-21 NOTE — Progress Notes (Signed)
PROGRESS NOTE    HPI was taken from Dr. Jayme Cloud: Patient is a 60 year old male with supra morbid obesity, obesity hypoventilation syndrome and chronic hypoxic respiratory failure due to the same who presented to [facility] ED due to increased shortness of breath 4 hours prior to admission. His past medical history is as noted below. Per history he had myalgias and cough. He had been on his CPAP prior to presenting to the emergency room but found no relief from it.  Patient was being evaluated in the emergency room and was placed on BiPAP.  However shortly after being placed on BiPAP he became unresponsive and pulseless.  He was noted to be in asystole and received ACLS protocols for approximately 10 minutes until ROSC was obtained.  He was intubated during the procedure.  He is now intubated and mechanically ventilated.  No further history can be obtained.  In the ED laboratory data has no surface patient is COVID 19 positive.   Hospital course from Dr. Mayford Knife 2/28-04/17/20: By the time I saw the pt, the pt was already out of the ICU. Pt is on steroids, zyvox and lasix. Pt was found to have COVID19 pneumonia, superimposed MRSA pneumonia & pulmonary vascular congestion. Pt evidently had cardiac arrest which was believed to be respiratory arrest as per ICU. Pt is finally back to his baseline oxygen level of 2-3L . PT/OT recs SNF but pt is refusing SNF. Pt is currently too weak to be d/c home. Pt lives w/ roommate only.      Tyquavious Gamel  NOB:[id number] DOB: 12-Oct-1960 DOA: 04/06/2020 PCP: [name], DO   Assessment & Plan:   Active Problems:   Acute respiratory failure (HCC)   Respiratory arrest (HCC)   Acute on chronic hypoxic & hypercapnic respiratory failure:  back to baseline oxygen level. Secondary to COVID19 pneumonia, superimposed MRSA pneumonia & pulmonary vascular congestion.  CTA chest was neg for PE. --cont IV lasix 40 mg BID --cont prednisone taper --cont foley  while aggressively diuresing, due to skin wound and pt not able to urinate into a container, and not able to get out of bed.  Cardiac arrest: w/ asystole.  Suspect respiratory arrest.  ROSC obtained in 10 mins.   DM2: likely new onset.  HbA1c 6.6, well controlled.  --SSI  Transaminitis: etiology unclear, labile   Elevated troponins: likely secondary to demand ischemia   A. fib: likely PAF, w/ intermittent RVR. Acquired thrombophilia   --switched from warfarin to Eliquis to due pt's previous non-compliance with INR checks --cont metop --cont Eliquis  Acute on chronic systolic CHF:  echo shows EF 30-35%, normal diastolic function.  --cont IV lasix 40 mg BID --Strict I/Os.   UTI: secondary to enterococcus faecium.  S/p zyvox   Hyponatremia: resolved  Morbid obesity: BMI 69.  Complicates overall care and prognosis   Hypothyroidism --cont Synthroid  Generalized weakness:  PT/OT recs SNF, however, per TOC, currently not able to obtain [facility] services for pt nor place pt in SNF rehab.  Pt had also refused SNF rehab. --PT/OT as much as possible while inpatient  Right shoulder pain --cont home Soma TID, per pt request --xray of right shoulder wnl   DVT prophylaxis: eliquis Code Status: full  Family Communication:  Disposition Plan: Home    Level of care: Med-Surg   Status is: Inpatient  Remains inpatient appropriate because:Unsafe d/c plan and Inpatient level of care appropriate due to severity of illness, PT/OT recs SNF, however, per TOC, currently not able  to obtain [facility] services for pt nor place pt in SNF rehab.  Pt had also refused SNF rehab.    Dispo: The patient is from: Home              Anticipated d/c is to: Home              Patient currently is medically stable to d/c.   Difficult to place patient Yes   Consultants:     Procedures:    Antimicrobials: zyvox    Subjective: Continued to complain of right shoulder pain.  Asked to be helped to the chair.   PT asked to work with pt as much as possible.  Still making good amount of urine.   Objective: Vitals:   04/20/20 2240 04/20/20 2321 04/21/20 1100 04/21/20 1531  BP:  113/82 114/80 116/84  Pulse:  63 66 64  Resp:  16 16 16   Temp:  98.1 F (36.7 C) 98.3 F (36.8 C) 98.3 F (36.8 C)  TempSrc:  Oral Oral Oral  SpO2: 97% 96% 97% 97%  Weight:      Height:        Intake/Output Summary (Last 24 hours) at 04/21/2020 1846 Last data filed at 04/21/2020 0500 Gross per 24 hour  Intake 960 ml  Output 1950 ml  Net -990 ml   Filed Weights   04/17/20 0410 04/18/20 0523 04/19/20 0122  Weight: (!) 206.4 kg (!) 195.5 kg (!) 208.2 kg    Examination:  Constitutional: NAD, AAOx3 HEENT: conjunctivae and lids normal, EOMI CV: No cyanosis.   RESP: normal respiratory effort, on 2L Extremities: edema in BLE, improved SKIN: warm, dry Neuro: II - XII grossly intact.   Psych: irritated mood and affect.    Foley present   Data Reviewed: I have personally reviewed following labs and imaging studies  CBC: Recent Labs  Lab 04/15/20 0645 04/16/20 0540 04/17/20 0330 04/18/20 0530 04/19/20 0545 04/20/20 0507 04/21/20 0545  WBC 10.4 8.8 10.7* 10.7* 12.3* 9.1 9.1  NEUTROABS 8.7* 7.5 9.5* 9.4*  --   --   --   HGB 12.4* 11.6* 11.8* 11.7* 11.6* 11.2* 11.4*  HCT 37.9* 35.3* 37.1* 36.7* 36.0* 34.4* 35.6*  MCV 96.2 97.0 98.1 99.2 98.4 98.6 99.2  PLT 210 170 202 204 210 186 198   Basic Metabolic Panel: Recent Labs  Lab 04/15/20 0645 04/15/20 0645 04/16/20 0540 04/17/20 0330 04/18/20 0530 04/19/20 0545 04/20/20 0507 04/21/20 0545  NA  --    < > 136 136 137 136 137 138  K  --    < > 4.0 4.4 4.9 5.0 4.4 4.0  CL  --    < > 92* 91* 93* 92* 91* 89*  CO2  --    < > 34* 35* 37* 38* 40* 40*  GLUCOSE  --    < > 171* 216* 193* 207* 129* 154*  BUN  --    < > 40* 42* 39* 40* 36* 34*  CREATININE  --    < > 0.80 0.95 0.79 0.81 0.68 0.76  CALCIUM  --    < > 8.8* 9.1 9.1 9.2 8.9 8.9  MG 2.2  --  2.2  2.2 2.0 2.1 2.1 2.0  PHOS 3.9  --  4.4 4.8* 4.0  --   --   --    < > = values in this interval not displayed.   GFR: Estimated Creatinine Clearance: 174.8 mL/min (by C-G formula based on SCr of 0.76 mg/dL). Liver  Function Tests: Recent Labs  Lab 04/16/20 0540 04/17/20 0330 04/18/20 0530  AST 33 51* 53*  ALT 53* 71* 91*  ALKPHOS 64 69 61  BILITOT 1.0 1.1 0.8  PROT 5.6* 6.1* 5.9*  ALBUMIN 2.7* 3.0* 3.0*   No results for input(s): LIPASE, AMYLASE in the last 168 hours. No results for input(s): AMMONIA in the last 168 hours. Coagulation Profile: Recent Labs  Lab 04/17/20 0330 04/18/20 0530 04/19/20 0545 04/20/20 0507 04/21/20 0545  INR 1.3* 1.5* 1.6* 1.9* 1.4*   Cardiac Enzymes: No results for input(s): CKTOTAL, CKMB, CKMBINDEX, TROPONINI in the last 168 hours. BNP (last 3 results) No results for input(s): PROBNP in the last 8760 hours. HbA1C: No results for input(s): HGBA1C in the last 72 hours. CBG: Recent Labs  Lab 04/20/20 1750 04/20/20 2102 04/21/20 0759 04/21/20 1155 04/21/20 1842  GLUCAP 177* 157* 134* 126* 139*   Lipid Profile: No results for input(s): CHOL, HDL, LDLCALC, TRIG, CHOLHDL, LDLDIRECT in the last 72 hours. Thyroid Function Tests: No results for input(s): TSH, T4TOTAL, FREET4, T3FREE, THYROIDAB in the last 72 hours. Anemia Panel: No results for input(s): VITAMINB12, FOLATE, FERRITIN, TIBC, IRON, RETICCTPCT in the last 72 hours. Sepsis Labs: No results for input(s): PROCALCITON, LATICACIDVEN in the last 168 hours.  No results found for this or any previous visit (from the past 240 hour(s)).       Radiology Studies: DG Shoulder Right  Result Date: 04/20/2020 CLINICAL DATA:  Acute RIGHT shoulder pain. EXAM: RIGHT SHOULDER - 2+ VIEW COMPARISON:  None. FINDINGS: No evidence of acute fracture, subluxation or dislocation. No focal bony lesions are identified. Mild degenerative changes at the [facility] joint noted. IMPRESSION: No acute abnormality.  Electronically Signed   By: Harmon Pier M.D.   On: 04/20/2020 13:19        Scheduled Meds: . apixaban  5 mg Oral BID  . aspirin  81 mg Oral Daily  . atorvastatin  40 mg Oral Daily  . carisoprodol  350 mg Oral TID  . Chlorhexidine Gluconate Cloth  6 each Topical Daily  . DULoxetine  20 mg Oral Daily  . furosemide  40 mg Intravenous BID  . gabapentin  300 mg Oral TID  . guaiFENesin  1,200 mg Oral BID  . hydrocerin   Topical Daily  . insulin aspart  0-20 Units Subcutaneous TID PC & HS  . Ipratropium-Albuterol  1 puff Inhalation Q6H  . levothyroxine  75 mcg Oral Q0600  . lisinopril  2.5 mg Oral Daily  . mouth rinse  15 mL Mouth Rinse BID  . metoprolol tartrate  25 mg Oral BID  . montelukast  10 mg Oral QHS  . multivitamin with minerals  1 tablet Oral Daily  . predniSONE  30 mg Oral Q breakfast   Followed by  . [START ON 04/23/2020] predniSONE  20 mg Oral Q breakfast  . Ensure Max Protein  11 oz Oral BID  . traZODone  150 mg Oral QHS   Continuous Infusions: . sodium chloride 250 mL (04/13/20 2057)     LOS: 15 days     Darlin Priestly, MD [facility] Pager [phone]  If 7PM-7AM, please contact night-coverage 04/21/2020, 6:46 PM

## 2020-04-21 NOTE — Progress Notes (Signed)
Nutrition Follow Up Note   DOCUMENTATION CODES:   Morbid obesity  INTERVENTION:   Ensure Max protein supplement BID, each supplement provides 150kcal and 30g of protein.  MVI po daily   NUTRITION DIAGNOSIS:   Inadequate oral intake related to inability to eat (pt sedated and ventilated) as evidenced by NPO status. -resolved  GOAL:   Patient will meet greater than or equal to 90% of their needs  -met   MONITOR:   PO intake,Supplement acceptance,Labs,Weight trends,Skin,I & O's  ASSESSMENT:   60 y/o male with h/o HTN, MI, COPD, GERD, OSA, morbid obesity, Afib, CHF, hernia repair and lymphedema who is admitted with UTI and COVID 19   Pt with good appetite and oral intake in hospital; pt eating 100% of meals. Ensure discontinued; will resume this in between meals to help pt meet his estimated needs. Per chart, pt has remained fairly weight stable since admit.   Medications reviewed and include: aspirin, lasix, insulin, synthroid, predisone, MVI  Labs reviewed: K 4.0 wnl, BUN 34(H), Mg 2.0 wnl cbgs- 134,126 x 24 hrs  Diet Order:   Diet Order            Diet Carb Modified Fluid consistency: Thin; Room service appropriate? Yes  Diet effective now                EDUCATION NEEDS:   No education needs have been identified at this time  Skin:  Skin Assessment: Reviewed RN Assessment (Stage III R buttocks)  Last BM:  3/6- type 3  Height:   Ht Readings from Last 1 Encounters:  04/13/20 _0  (1.727 m)    Weight:   Wt Readings from Last 1 Encounters:  04/19/20 (!) 208.2 kg    Ideal Body Weight:  72.7 kg  BMI:  Body mass index is 69.79 kg/m.  Estimated Nutritional Needs:   Kcal:  3200-3500kcal/day  Protein:  >150g/day  Fluid:  2.2-2.5L/day  Koleen Distance MS, RD, LDN Please refer to [facility] for RD and/or RD on-call/weekend/after hours pager
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
## 2020-04-21 NOTE — Progress Notes (Addendum)
Physical Therapy Treatment Patient Details Name: [name] [name] MRN: [id number] DOB: [birth year] Today's Date: 04/21/2020    History of Present Illness Patient is a 60 year old male with supra morbid obesity, obesity hypoventilation syndrome and chronic hypoxic respiratory failure due to the same who presented to [facility] ED on 04/06/2020 due to increased shortness of breath 4 hours prior to admission. He had been on his CPAP prior to presenting to the emergency room but found no relief from it. Patient was being evaluated in the emergency room and was placed on BiPAP. However shortly after being placed on BiPAP he became unresponsive and pulseless.  He was noted to be in asystole and received ACLS protocols for approximately 10 minutes until ROSC was obtained. He was intubated during the procedure, and transfered to ICU intubated and mechanically ventilated.    PT Comments    Pt requesting therapy session today.  Unable to coordinate care with nursing or tech to assist with mobility skills.  He is upset but agrees to supine ex.  He is able to assist with repositioning in bed with good effort using BUE and LE's.  Participated in exercises as described below.  He is limited bu R hip pain "it's wore out".  And is generally fatigued with exercises at bed level.    Pt is [facility] for [facility] lift transfer in and out of bed with nursing to allow for daily OOB and therapy sessions to focus more on strength and functional tasks.  He does c/o back of Right leg being sore from bedrails on bari-bed.    He also requests therapy session to be with male therapist if able.    Encouraged pt do do HEP as able on his own to help with strength.  He stated he has not been doing them on his own thus far.   Follow Up Recommendations  SNF;Supervision for mobility/OOB;Supervision/Assistance - 24 hour     Equipment Recommendations  Other (comment) (defer to next level of care)    Recommendations for Other Services        Precautions / Restrictions Precautions Precautions: Fall Restrictions Weight Bearing Restrictions: No    Mobility  Bed Mobility Overal bed mobility: Needs Assistance             General bed mobility comments: max a to reposition in bed but does assist in supine scooting but given size still needs considerable assist.    Transfers                    Ambulation/Gait                 Stairs             Wheelchair Mobility    Modified Rankin (Stroke Patients Only)       Balance                                            Cognition Arousal/Alertness: Awake/alert Behavior During Therapy: WFL for tasks assessed/performed Overall Cognitive Status: Within Functional Limits for tasks assessed                                        Exercises Other Exercises Other Exercises: BLE ex.  AAROM RLE x 10 limtied by R hip pain ,  LLE with min AAROM 2 x 10    General Comments        Pertinent Vitals/Pain Pain Assessment: Faces Faces Pain Scale: Hurts even more Pain Location: "all over" Pain Descriptors / Indicators: Grimacing;Guarding;Moaning Pain Intervention(s): Limited activity within patient's tolerance;Monitored during session;Repositioned    Home Living                      Prior Function            PT Goals (current goals can now be found in the care plan section) Progress towards PT goals: Progressing toward goals    Frequency    Min 2X/week      PT Plan Current plan remains appropriate    Co-evaluation              AM-PAC PT "6 Clicks" Mobility   Outcome Measure  Help needed turning from your back to your side while in a flat bed without using bedrails?: A Lot Help needed moving from lying on your back to sitting on the side of a flat bed without using bedrails?: A Lot Help needed moving to and from a bed to a chair (including a wheelchair)?: Total Help needed standing  up from a chair using your arms (e.g., wheelchair or bedside chair)?: Total Help needed to walk in hospital room?: Total Help needed climbing 3-5 steps with a railing? : Total 6 Click Score: 8    End of Session Equipment Utilized During Treatment: Oxygen Activity Tolerance: Patient limited by fatigue Patient left: with call bell/phone within reach;in bed;with bed alarm set Nurse Communication: Mobility status PT Visit Diagnosis: Other abnormalities of gait and mobility (R26.89);Muscle weakness (generalized) (M62.81);Difficulty in walking, not elsewhere classified (R26.2);Unsteadiness on feet (R26.81)     Time: 1308-6578 PT Time Calculation (min) (ACUTE ONLY): 17 min  Charges:  $Therapeutic Exercise: 8-22 mins                    Danielle Dess, PTA 04/21/20, 4:31 PM

## 2020-04-22 ENCOUNTER — Telehealth: Payer: Self-pay | Admitting: Licensed Clinical Social Worker

## 2020-04-22 ENCOUNTER — Ambulatory Visit: Payer: Medicaid Other | Admitting: Licensed Clinical Social Worker

## 2020-04-22 DIAGNOSIS — L89313 Pressure ulcer of right buttock, stage 3: Secondary | ICD-10-CM

## 2020-04-22 LAB — CBC
HCT: 34 % — ABNORMAL LOW (ref 39.0–52.0)
Hemoglobin: 11.3 g/dL — ABNORMAL LOW (ref 13.0–17.0)
MCH: 32.3 pg (ref 26.0–34.0)
MCHC: 33.2 g/dL (ref 30.0–36.0)
MCV: 97.1 fL (ref 80.0–100.0)
Platelets: 183 10*3/uL (ref 150–400)
RBC: 3.5 MIL/uL — ABNORMAL LOW (ref 4.22–5.81)
RDW: 17.9 % — ABNORMAL HIGH (ref 11.5–15.5)
WBC: 8 10*3/uL (ref 4.0–10.5)
nRBC: 0 % (ref 0.0–0.2)

## 2020-04-22 LAB — GLUCOSE, CAPILLARY
Glucose-Capillary: 109 mg/dL — ABNORMAL HIGH (ref 70–99)
Glucose-Capillary: 191 mg/dL — ABNORMAL HIGH (ref 70–99)
Glucose-Capillary: 177 mg/dL — ABNORMAL HIGH (ref 70–99)
Glucose-Capillary: 154 mg/dL — ABNORMAL HIGH (ref 70–99)

## 2020-04-22 LAB — MAGNESIUM: Magnesium: 2.1 mg/dL (ref 1.7–2.4)

## 2020-04-22 LAB — BASIC METABOLIC PANEL
Anion gap: 8 (ref 5–15)
BUN: 33 mg/dL — ABNORMAL HIGH (ref 6–20)
CO2: 38 mmol/L — ABNORMAL HIGH (ref 22–32)
Calcium: 8.8 mg/dL — ABNORMAL LOW (ref 8.9–10.3)
Chloride: 91 mmol/L — ABNORMAL LOW (ref 98–111)
Creatinine, Ser: 0.78 mg/dL (ref 0.61–1.24)
GFR, Estimated: >60 mL/min (ref >60)
Glucose, Bld: 141 mg/dL — ABNORMAL HIGH (ref 70–99)
Potassium: 3.8 mmol/L (ref 3.5–5.1)
Sodium: 137 mmol/L (ref 135–145)

## 2020-04-22 MED ORDER — LORAZEPAM 2 MG/ML IJ SOLN: 1.0000 mg | Freq: Once | INTRAMUSCULAR | Status: DC | PRN

## 2020-04-22 NOTE — Progress Notes (Signed)
Physical Therapy Treatment Patient Details Name: [name] [name] MRN: [id number] DOB: 28-Jun-1960 Today's Date: 04/22/2020    History of Present Illness Patient is a 60 year old male with supra morbid obesity, obesity hypoventilation syndrome and chronic hypoxic respiratory failure due to the same who presented to [facility] ED on 04/06/2020 due to increased shortness of breath 4 hours prior to admission. He had been on his CPAP prior to presenting to the emergency room but found no relief from it. Patient was being evaluated in the emergency room and was placed on BiPAP. However shortly after being placed on BiPAP he became unresponsive and pulseless.  He was noted to be in asystole and received ACLS protocols for approximately 10 minutes until ROSC was obtained. He was intubated during the procedure, and transfered to ICU intubated and mechanically ventilated.    PT Comments    Pt was sitting EOB without assistance upon arriving. He agrees to PT session but does endorse," I feel soo weak." Discussed the need to perform there ex throughout the day ion bed and when OOB. Discussed importance to get OOB more often. He was able to stand from slightly elevated bed height with mod-max of one. Vcs for fwd wt shift and improved technique. Was able to progress to taking ~ 5 steps from EOB to recliner. HR elevated to 144 and severe SOB noted. No desaturation with O2 [place] donned. Pt needs extended time to control SOB and HR to return to 105 bpm. Performed there ex in recliner prior to pt stating he needs to rest. Michiel Sites pad was placed under pt in recliner to assist with returning to bed if unable to stand pivot with +2 assist. Both RN and RN tech aware. Pt was most limited by fatigue/ cardio-pulmonary response to activity. He would greatly benefit from extensive PT going forward. Recommend SNF however pt has refused up until this point. A lot of conversation about DC disposition. Pt starting to think he will not be able to  manage at home alone. Will continue to progress and treat per current POC. Pt had call bell in reach, chair alarm in place, and call bell in reach at conclusion of session.     Follow Up Recommendations  SNF;Supervision for mobility/OOB;Supervision/Assistance - 24 hour     Equipment Recommendations  Other (comment) (ongoing assessment)    Recommendations for Other Services       Precautions / Restrictions Precautions Precautions: Fall Restrictions Weight Bearing Restrictions: No    Mobility  Bed Mobility  General bed mobility comments: Pt was seated EOB upon arriving. He agrees to attempt OOB.    Transfers Overall transfer level: Needs assistance Equipment used: Rolling walker (2 wheeled) Transfers: Sit to/from Stand Sit to Stand: Mod assist;Max assist;From elevated surface (of one)         General transfer comment: Pt was able to stand from slightly elevated bed height with +1 assist. needs encourgement for effort however was able to stand to RW with mod assist + vcs for fwd wt shift  Ambulation/Gait Ambulation/Gait assistance: Mod assist Gait Distance (Feet): 5 Feet Assistive device: Rolling walker (2 wheeled) (bariatric) Gait Pattern/deviations: Step-to pattern Gait velocity: decreased   General Gait Details: Pt HR elevated to 144bpm with only taking ~ 4-5 steps to recliner. Pt very SOB and took ~ 2 minutes to resolve to 110       Balance Overall balance assessment: Needs assistance Sitting-balance support: Feet supported Sitting balance-Leahy Scale: Good Sitting balance - Comments: Pt was able to  sit EOB unsupported   Standing balance support: Bilateral upper extremity supported;During functional activity Standing balance-Leahy Scale: Fair Standing balance comment: BUE support on RW for support in standing       Cognition Arousal/Alertness: Awake/alert Behavior During Therapy: WFL for tasks assessed/performed Overall Cognitive Status: Within Functional  Limits for tasks assessed          General Comments: Pt is A and O x 4 and cooperative. Motivated to get OOB however pt reports he does not feel strong enough             Pertinent Vitals/Pain Pain Assessment: No/denies pain Faces Pain Scale: Hurts a little bit Pain Location: "all over" Pain Descriptors / Indicators: Grimacing;Guarding;Moaning Pain Intervention(s): Limited activity within patient's tolerance;Monitored during session;Repositioned           PT Goals (current goals can now be found in the care plan section) Acute Rehab PT Goals Patient Stated Goal: go home Progress towards PT goals: Progressing toward goals    Frequency    Min 2X/week      PT Plan Current plan remains appropriate    Co-evaluation     PT goals addressed during session: Mobility/safety with mobility;Balance;Proper use of DME;Strengthening/ROM        AM-PAC PT "6 Clicks" Mobility   Outcome Measure  Help needed turning from your back to your side while in a flat bed without using bedrails?: A Lot Help needed moving from lying on your back to sitting on the side of a flat bed without using bedrails?: A Lot Help needed moving to and from a bed to a chair (including a wheelchair)?: A Lot Help needed standing up from a chair using your arms (e.g., wheelchair or bedside chair)?: A Lot Help needed to walk in hospital room?: Total Help needed climbing 3-5 steps with a railing? : Total 6 Click Score: 10    End of Session Equipment Utilized During Treatment: Oxygen Activity Tolerance: Patient limited by fatigue Patient left: in chair;with call bell/phone within reach;with chair alarm set Nurse Communication: Mobility status PT Visit Diagnosis: Other abnormalities of gait and mobility (R26.89);Muscle weakness (generalized) (M62.81);Difficulty in walking, not elsewhere classified (R26.2);Unsteadiness on feet (R26.81)     Time: 0076-2263 PT Time Calculation (min) (ACUTE ONLY): 41  min  Charges:  $Therapeutic Activity: 38-52 mins                     Jetta Lout PTA 04/22/20, 11:47 AM

## 2020-04-22 NOTE — Progress Notes (Addendum)
PROGRESS NOTE    HPI was taken from Dr. Jayme Cloud: Patient is a 60 year old male with supra morbid obesity, obesity hypoventilation syndrome and chronic hypoxic respiratory failure due to the same who presented to [facility] ED due to increased shortness of breath 4 hours prior to admission. His past medical history is as noted below. Per history he had myalgias and cough. He had been on his CPAP prior to presenting to the emergency room but found no relief from it.  Patient was being evaluated in the emergency room and was placed on BiPAP.  However shortly after being placed on BiPAP he became unresponsive and pulseless.  He was noted to be in asystole and received ACLS protocols for approximately 10 minutes until ROSC was obtained.  He was intubated during the procedure.  He is now intubated and mechanically ventilated.  No further history can be obtained.  In the ED laboratory data has no surface patient is COVID 19 positive.   Hospital course from Dr. Mayford Knife 2/28-04/17/20: By the time I saw the pt, the pt was already out of the ICU. Pt is on steroids, zyvox and lasix. Pt was found to have COVID19 pneumonia, superimposed MRSA pneumonia & pulmonary vascular congestion. Pt evidently had cardiac arrest which was believed to be respiratory arrest as per ICU. Pt is finally back to his baseline oxygen level of 2-3L [place]. PT/OT recs SNF but pt is refusing SNF. Pt is currently too weak to be d/c home. Pt lives w/ roommate only.      [name] [name]  ZOX:[id number] DOB: 1960-07-12 DOA: 04/06/2020 PCP: [name], DO   Assessment & Plan:   Active Problems:   Acute respiratory failure (HCC)   Pressure ulcer   Respiratory arrest (HCC)   Acute on chronic hypoxic & hypercapnic respiratory failure:  back to baseline oxygen level. Secondary to COVID19 pneumonia, superimposed MRSA pneumonia & pulmonary vascular congestion.  CTA chest was neg for PE. --cont IV lasix 40 mg BID to help remove more  volume  --cont prednisone taper --cont foley while aggressively diuresing, due to skin wound and pt not able to urinate into a container, and not able to get out of bed.  Cardiac arrest: w/ asystole.  Suspect respiratory arrest.  ROSC obtained in 10 mins.   DM2: likely new onset.  HbA1c 6.6, well controlled.  --SSI  Transaminitis: etiology unclear, labile   Elevated troponins: likely secondary to demand ischemia   A. fib: likely PAF, w/ intermittent RVR. Acquired thrombophilia   --switched from warfarin to Eliquis to due pt's previous non-compliance with INR checks --cont metop --cont Eliquis  Acute on chronic systolic CHF:  echo shows EF 30-35%, normal diastolic function.  --cont IV lasix 40 mg BID --Strict I/Os.   UTI: secondary to enterococcus faecium.  S/p zyvox   Hyponatremia: resolved  Morbid obesity: BMI 69.  Complicates overall care and prognosis   Hypothyroidism --cont Synthroid  Generalized weakness:  PT/OT recs SNF, however, per TOC, currently not able to obtain [facility] services for pt nor place pt in SNF rehab.  Pt had also refused SNF rehab. --PT/OT as much as possible while inpatient --TOC to continue discuss options with pt  Right shoulder pain --cont home Soma TID, per pt request --xray of right shoulder wnl --MRI w/o contrast of right shoulder (IV ativan ordered to be given prior to MRI).  Pressure ulcer right buttock stage 3, POA --bariatric bed --keep foley while aggressively diuresing to prevent more skin breakdown  DVT prophylaxis: eliquis Code Status: full  Family Communication:  Disposition Plan: Home    Level of care: Med-Surg   Status is: Inpatient  Remains inpatient appropriate because:Unsafe d/c plan and Inpatient level of care appropriate due to severity of illness, PT/OT recs SNF, however, per TOC, currently not able to obtain [facility] services for pt nor place pt in SNF rehab.  Pt had also refused SNF rehab.    Dispo: The patient is  from: Home              Anticipated d/c is to: Home              Patient currently is medically stable to d/c.   Difficult to place patient Yes   Consultants:     Procedures:    Antimicrobials: zyvox    Subjective: Complained of right shoulder pain and right hip pain.  Also reported more dyspnea today.  Pt reported still voiding a lot.     Objective: Vitals:   04/22/20 0441 04/22/20 0746 04/22/20 1148 04/22/20 1639  BP: 128/61 130/90 (!) 152/70 125/74  Pulse: 75 (!) 53 (!) 108 79  Resp: 18 18 20 20   Temp: 98.1 F (36.7 C) 98.1 F (36.7 C) 98 F (36.7 C) 98.5 F (36.9 C)  TempSrc: Oral     SpO2: 98% 99% 98% 96%  Weight:      Height:        Intake/Output Summary (Last 24 hours) at 04/22/2020 2030 Last data filed at 04/22/2020 1904 Gross per 24 hour  Intake 960 ml  Output 5200 ml  Net -4240 ml   Filed Weights   04/17/20 0410 04/18/20 0523 04/19/20 0122  Weight: (!) 206.4 kg (!) 195.5 kg (!) 208.2 kg    Examination:  Constitutional: NAD, AAOx3 HEENT: conjunctivae and lids normal, EOMI CV: No cyanosis.   RESP: normal respiratory effort, on 2L Extremities: edema in BLE improved, ACE wrap present SKIN: warm, dry Neuro: II - XII grossly intact.   Psych: Normal mood and affect.    Foley present   Data Reviewed: I have personally reviewed following labs and imaging studies  CBC: Recent Labs  Lab 04/16/20 0540 04/17/20 0330 04/18/20 0530 04/19/20 0545 04/20/20 0507 04/21/20 0545 04/22/20 0709  WBC 8.8 10.7* 10.7* 12.3* 9.1 9.1 8.0  NEUTROABS 7.5 9.5* 9.4*  --   --   --   --   HGB 11.6* 11.8* 11.7* 11.6* 11.2* 11.4* 11.3*  HCT 35.3* 37.1* 36.7* 36.0* 34.4* 35.6* 34.0*  MCV 97.0 98.1 99.2 98.4 98.6 99.2 97.1  PLT 170 202 204 210 186 198 183   Basic Metabolic Panel: Recent Labs  Lab 04/16/20 0540 04/17/20 0330 04/18/20 0530 04/19/20 0545 04/20/20 0507 04/21/20 0545 04/22/20 0709  NA 136 136 137 136 137 138 137  K 4.0 4.4 4.9 5.0 4.4 4.0 3.8   CL 92* 91* 93* 92* 91* 89* 91*  CO2 34* 35* 37* 38* 40* 40* 38*  GLUCOSE 171* 216* 193* 207* 129* 154* 141*  BUN 40* 42* 39* 40* 36* 34* 33*  CREATININE 0.80 0.95 0.79 0.81 0.68 0.76 0.78  CALCIUM 8.8* 9.1 9.1 9.2 8.9 8.9 8.8*  MG 2.2 2.2 2.0 2.1 2.1 2.0 2.1  PHOS 4.4 4.8* 4.0  --   --   --   --    GFR: Estimated Creatinine Clearance: 174.8 mL/min (by C-G formula based on SCr of 0.78 mg/dL). Liver Function Tests: Recent Labs  Lab 04/16/20 0540 04/17/20 0330 04/18/20  0530  AST 33 51* 53*  ALT 53* 71* 91*  ALKPHOS 64 69 61  BILITOT 1.0 1.1 0.8  PROT 5.6* 6.1* 5.9*  ALBUMIN 2.7* 3.0* 3.0*   No results for input(s): LIPASE, AMYLASE in the last 168 hours. No results for input(s): AMMONIA in the last 168 hours. Coagulation Profile: Recent Labs  Lab 04/17/20 0330 04/18/20 0530 04/19/20 0545 04/20/20 0507 04/21/20 0545  INR 1.3* 1.5* 1.6* 1.9* 1.4*   Cardiac Enzymes: No results for input(s): CKTOTAL, CKMB, CKMBINDEX, TROPONINI in the last 168 hours. BNP (last 3 results) No results for input(s): PROBNP in the last 8760 hours. HbA1C: No results for input(s): HGBA1C in the last 72 hours. CBG: Recent Labs  Lab 04/21/20 1842 04/21/20 2033 04/22/20 0757 04/22/20 1144 04/22/20 1623  GLUCAP 139* 132* 109* 191* 177*   Lipid Profile: No results for input(s): CHOL, HDL, LDLCALC, TRIG, CHOLHDL, LDLDIRECT in the last 72 hours. Thyroid Function Tests: No results for input(s): TSH, T4TOTAL, FREET4, T3FREE, THYROIDAB in the last 72 hours. Anemia Panel: No results for input(s): VITAMINB12, FOLATE, FERRITIN, TIBC, IRON, RETICCTPCT in the last 72 hours. Sepsis Labs: No results for input(s): PROCALCITON, LATICACIDVEN in the last 168 hours.  No results found for this or any previous visit (from the past 240 hour(s)).       Radiology Studies: No results found.      Scheduled Meds: . apixaban  5 mg Oral BID  . aspirin  81 mg Oral Daily  . atorvastatin  40 mg Oral Daily   . carisoprodol  350 mg Oral TID  . Chlorhexidine Gluconate Cloth  6 each Topical Daily  . DULoxetine  20 mg Oral Daily  . furosemide  40 mg Intravenous BID  . guaiFENesin  1,200 mg Oral BID  . hydrocerin   Topical Daily  . insulin aspart  0-20 Units Subcutaneous TID PC & HS  . Ipratropium-Albuterol  1 puff Inhalation Q6H  . levothyroxine  75 mcg Oral Q0600  . mouth rinse  15 mL Mouth Rinse BID  . metoprolol tartrate  25 mg Oral BID  . montelukast  10 mg Oral QHS  . multivitamin with minerals  1 tablet Oral Daily  . [START ON 04/23/2020] predniSONE  20 mg Oral Q breakfast  . Ensure Max Protein  11 oz Oral BID  . traZODone  150 mg Oral QHS   Continuous Infusions: . sodium chloride 250 mL (04/13/20 2057)     LOS: 16 days     Darlin Priestly, MD [facility] Pager [phone]  If 7PM-7AM, please contact night-coverage 04/22/2020, 8:30 PM

## 2020-04-22 NOTE — Progress Notes (Signed)
Patient said that the gabapentin does not help him. He refused to take it this am. Order received from Dr [name] Lowes to discontinue it

## 2020-04-22 NOTE — Telephone Encounter (Signed)
Chronic Care Management    Clinical Social Work General Follow Up Note  04/22/2020 Name: [name] [name] MRN: [id number] DOB: July 29, 1960  [name] [name] is a 60 y.o. year old male who is a primary care patient of [name], DO. The CCM team was consulted for assistance with Level of Care Concerns.   Review of patient status, including review of consultants reports, relevant laboratory and other test results, and collaboration with appropriate care team members and the patient's provider was performed as part of comprehensive patient evaluation and provision of chronic care management services.    LCSW completed CCM outreach attempt today but was unable to reach patient successfully. A HIPPA compliant voice message was left encouraging patient to return call once available. LCSW will ask Scheduling Care Guide to reschedule CCM SW appointment with patient as well.   Facility-Administered Encounter Medications as of 04/22/2020  Medication  . 0.9 %  sodium chloride infusion  . acetaminophen (TYLENOL) tablet 650 mg  . apixaban (ELIQUIS) tablet 5 mg  . aspirin chewable tablet 81 mg  . atorvastatin (LIPITOR) tablet 40 mg  . carisoprodol (SOMA) tablet 350 mg  . Chlorhexidine Gluconate Cloth 2 % PADS 6 each  . cyclobenzaprine (FLEXERIL) tablet 5 mg  . docusate (COLACE) 50 MG/5ML liquid 100 mg  . DULoxetine (CYMBALTA) DR capsule 20 mg  . furosemide (LASIX) injection 40 mg  . gabapentin (NEURONTIN) capsule 300 mg  . guaiFENesin (MUCINEX) 12 hr tablet 1,200 mg  . guaiFENesin-codeine 100-10 MG/5ML solution 10 mL  . hydrocerin (EUCERIN) cream  . insulin aspart (novoLOG) injection 0-20 Units  . Ipratropium-Albuterol (COMBIVENT) respimat 1 puff  . levothyroxine (SYNTHROID) tablet 75 mcg  . MEDLINE mouth rinse  . metoprolol tartrate (LOPRESSOR) injection 5 mg  . metoprolol tartrate (LOPRESSOR) tablet 25 mg  . montelukast (SINGULAIR) tablet 10 mg  . multivitamin with minerals tablet 1  tablet  . oxyCODONE-acetaminophen (PERCOCET) 7.5-325 MG per tablet 1 tablet  . polyethylene glycol (MIRALAX / GLYCOLAX) packet 17 g  . polyvinyl alcohol (LIQUIFILM TEARS) 1.4 % ophthalmic solution 1 drop  . [START ON 04/23/2020] predniSONE (DELTASONE) tablet 20 mg  . protein supplement (ENSURE MAX) liquid  . sodium chloride flush (NS) 0.9 % injection 10-40 mL  . traZODone (DESYREL) tablet 150 mg   Outpatient Encounter Medications as of 04/22/2020  Medication Sig Note  . albuterol (VENTOLIN HFA) 108 (90 Base) MCG/ACT inhaler Inhale 2 puffs into the lungs every 4 (four) hours as needed for wheezing or shortness of breath. (Patient not taking: No sig reported)   . amiodarone (PACERONE) 200 MG tablet Take 1 tablet (200 mg total) by mouth 2 (two) times daily. (Patient not taking: Reported on 04/06/2020)   . aspirin 81 MG chewable tablet Chew 1 tablet (81 mg total) by mouth daily.   [name] atorvastatin (LIPITOR) 40 MG tablet TAKE 1 TABLET BY MOUTH ONCE DAILY (Patient taking differently: Take 40 mg by mouth daily.)   . carisoprodol (SOMA) 350 MG tablet Take 1 tablet (350 mg total) by mouth 3 (three) times daily as needed for muscle spasms. (Patient not taking: No sig reported) 02/07/2020: "Not covered by Medicaid"  . DULoxetine (CYMBALTA) 20 MG capsule Take 1 capsule (20 mg total) by mouth daily. 04/06/2020: LF 03-17-20 30days  . gabapentin (NEURONTIN) 300 MG capsule Take 300 mg by mouth 3 (three) times daily.   [name] levothyroxine (SYNTHROID) 75 MCG tablet Take 1 tablet (75 mcg total) by mouth daily before breakfast. 04/06/2020: LF 01-14-20 30days  .  lisinopril (ZESTRIL) 2.5 MG tablet Take by mouth.   . loperamide (IMODIUM) 2 MG capsule Take 1 capsule (2 mg total) by mouth as needed for diarrhea or loose stools.   . magnesium oxide (MAG-OX) 400 (241.3 Mg) MG tablet Take 1 tablet (400 mg total) by mouth 2 (two) times daily.   . meclizine (ANTIVERT) 25 MG tablet Take 0.5-1 tablets (12.5-25 mg total) by mouth 3 (three)  times daily as needed for dizziness. 04/06/2020: LF 02-26-20  . metoprolol succinate (TOPROL-XL) 50 MG 24 hr tablet Take 2 tablets (100 mg total) by mouth daily. Take with or immediately following a meal.   . montelukast (SINGULAIR) 10 MG tablet Take 1 tablet (10 mg total) by mouth at bedtime.   . Multiple Vitamin (MULTIVITAMIN WITH MINERALS) TABS tablet Take 1 tablet by mouth daily.   . naphazoline-glycerin (CLEAR EYES REDNESS) 0.012-0.2 % SOLN Place 1-2 drops into both eyes 4 (four) times daily as needed for eye irritation.   . nitroGLYCERIN (NITROSTAT) 0.4 MG SL tablet Place 1 tablet under tongue every 5 minutes as needed for chest pain. (No more than 3 doses within 15 minutes)   . potassium chloride (KLOR-CON) 20 MEQ packet Take 40 mEq by mouth daily. (Patient not taking: Reported on 04/06/2020)   . SUMAtriptan (IMITREX) 50 MG tablet Take 1 tablet (50 mg total) by mouth every 2 (two) hours as needed for migraine or headache. May repeat in 2 hours if headache persists or recurs. No more than 2 doses in a day   . thiamine 100 MG tablet Take 1 tablet (100 mg total) by mouth daily. (Patient not taking: Reported on 04/06/2020)   . torsemide (DEMADEX) 20 MG tablet Take 4 tablets (80 mg total) by mouth 2 (two) times daily. 04/06/2020: LF 02-02-20  . traZODone (DESYREL) 150 MG tablet Take 1 tablet (150 mg total) by mouth at bedtime. 04/06/2020: LF 03-17-20 30days  . warfarin (COUMADIN) 5 MG tablet Take 5 mg by mouth daily.  04/06/2020: LF 03-18-19 30days    Follow Up Plan: Scheduling Care Guide will reach out to patient to reschedule appointment.   [name], BSW, MSW, LCSW [facility] [place]  [facility] [email or web address] Phone: [phone]

## 2020-04-22 NOTE — Chronic Care Management (AMB) (Addendum)
Care Management   Follow Up Note   04/22/2020 Name: [name] [name] MRN: [id number] DOB: [birth year]   Referred by: [name], DO Reason for referral : Care Coordination   [name] is a 60 y.o. year old male who is a primary care patient of [name], DO. The care management team was consulted for assistance with care management and care coordination needs.    Review of patient status, including review of consultants reports, relevant laboratory and other test results, and collaboration with appropriate care team members and the patient's provider was performed as part of comprehensive patient evaluation and provision of chronic care management services.    CCM LCSW received return call and voice message from patient's sister stating that patient's number is currently disconnected but she will continue to try to reach him and will notify CCM LCSW once she establishes contact with patient. CCM LCSW completed chart review and became aware of patinet's current hospitalization and updated entire team and PCP. CCM LCSW will reschedule CCM Social Work appointment.   Timeframe:  Short-Term Goal Priority:  Medium Start Date:   04/29/20                       Expected End Date:  07/30/20                  Follow Up Date-06/04/20   - begin personal counseling - call and visit an old friend - check out volunteer opportunities - join a support group - laugh; watch a funny movie or comedian - learn and use visualization or guided imagery - perform a random act of kindness - practice relaxation or meditation daily - start or continue a personal journal - talk about feelings with a friend, family or spiritual advisor - practice positive thinking and self-talk    Why is this important?    When you are stressed, down or upset, your body reacts too.   For example, your blood pressure may get higher; you may have a headache or stomachache.   When your emotions  get the best of you, your body's ability to fight off cold and flu gets weak.   These steps will help you manage your emotions.     Current Barriers:  . Financial constraints . Limited social support . Level of care concerns . ADL IADL limitations . Mental Health Concerns  . Limited education about how to apply for disability and for insurance coverage* . Limited access to caregiver . Lacks knowledge of community resource: available transportation resources within his area  Clinical Social Work Clinical Goal(s):  [name] Over the next 120 days, patient/caregiver will work with SW to address concerns related to care coordination needs, lack of a stable support network and lack of Economist. LCSW will assist patient in gaining additional support in order to maintain health and mental health appropriately  . Over the next 120 days, patient will demonstrate improved adherence to self care as evidenced by implementing healthy self-care into his daily routine such as: attending all medical appointments, deep breathing exercises, taking time for self-reflection, taking medications as prescribed, drinking water and daily exercise to improve mobility and mood.  . Over the next 120 days, patient will work with SW bi-monthly by telephone or in person to reduce or manage symptoms related to stress and anxiety . Over the next 120 days, patient will demonstrate improved health management independence as evidenced by implementing healthy self-care skills and  positive support/resource implementation into their daily routine to help cope with stressors and improve overall health and well-being  . Over the next 120 days, patient or caregiver will verbalize basic understanding of depression/stress process and self health management plan as evidenced by his participation in development of long term plan of care and institution of self health management strategies  Interventions: . Patient  is currently hospitalized and CCM LCSW was able to speak briefly to sister to complete appropriate care coordination.  . Patient was informed that current CCM LCSW will be leaving position next month and his next CCM Social Work follow up visit will be with another LCSW. Patient was appreciative of support provided and receptive to news . Past update listed above . Patient reported to CCM LCSW that short term SNF placement was recommended during his recent hospitalization but he declined this and discharged back home instead.  . Patient interviewed and appropriate assessments performed . Provided mental health counseling with regard to grief as patient lost spouse 2 years ago. Active and reflective listening provided.   . Provided patient with information about the disability and insurance enrollment process. . Discussed plans with patient for ongoing care management follow up and provided patient with direct contact information for care management team . Advised patient to consider [facility] for [facility] as he has limited in home support other than his sister and neighbor. However, patient reports not having the right form of Medicaid to gain this service.  . Patient confirms stable transportation to all appointments through [facility] transportation. Patient will contact Medicaid transportation this week to set up transportation arrangements for his upcoming appointments. Patient reports that he has to contact them 3 days in advance and cannot schedule transportation arrangements earlier. CCM LCSW provided positive reinforcement for keeping up with his medical appointments and transportation arrangements.  . Assisted patient/caregiver with obtaining information about health plan benefits . Provided education and assistance to client regarding Advanced Directives. . Provided education to patient regarding level of care options. . Patient main source of support includes his neighbor, sister and  sister in law [name]. He reports that his neighbor comes to his residence daily.  . Patient reports that his SOB is still the same. . Patient is still unable to gain HH. The patient has not gotten his new insurance card which is need for [facility] referral. Patient is agreeable to contact them today to inquire. Number provided to patient. UPDATE- Patient has Medicaid now but received a letter in the mail stating that they will not cover [facility] services.  Patient Self Care Activities:  . Attends all scheduled provider appointments . Calls provider office for new concerns or questions  Please see past updates related to this goal by clicking on the "Past Updates" button in the selected goal   [name], BSW, MSW, LCSW [facility]  [facility] [email or web address] Phone: [phone]

## 2020-04-23 ENCOUNTER — Telehealth: Payer: Self-pay

## 2020-04-23 LAB — CBC
HCT: 33.8 % — ABNORMAL LOW (ref 39.0–52.0)
Hemoglobin: 11.1 g/dL — ABNORMAL LOW (ref 13.0–17.0)
MCH: 31.7 pg (ref 26.0–34.0)
MCHC: 32.8 g/dL (ref 30.0–36.0)
MCV: 96.6 fL (ref 80.0–100.0)
Platelets: 185 10*3/uL (ref 150–400)
RBC: 3.5 MIL/uL — ABNORMAL LOW (ref 4.22–5.81)
RDW: 17.6 % — ABNORMAL HIGH (ref 11.5–15.5)
WBC: 7.8 10*3/uL (ref 4.0–10.5)
nRBC: 0 % (ref 0.0–0.2)

## 2020-04-23 LAB — GLUCOSE, CAPILLARY
Glucose-Capillary: 105 mg/dL — ABNORMAL HIGH (ref 70–99)
Glucose-Capillary: 159 mg/dL — ABNORMAL HIGH (ref 70–99)

## 2020-04-23 LAB — BASIC METABOLIC PANEL
Anion gap: 6 (ref 5–15)
BUN: 29 mg/dL — ABNORMAL HIGH (ref 6–20)
CO2: 39 mmol/L — ABNORMAL HIGH (ref 22–32)
Calcium: 8.7 mg/dL — ABNORMAL LOW (ref 8.9–10.3)
Chloride: 92 mmol/L — ABNORMAL LOW (ref 98–111)
Creatinine, Ser: 0.75 mg/dL (ref 0.61–1.24)
GFR, Estimated: >60 mL/min (ref >60)
Glucose, Bld: 143 mg/dL — ABNORMAL HIGH (ref 70–99)
Potassium: 3.9 mmol/L (ref 3.5–5.1)
Sodium: 137 mmol/L (ref 135–145)

## 2020-04-23 LAB — MAGNESIUM: Magnesium: 2 mg/dL (ref 1.7–2.4)

## 2020-04-23 MED ORDER — TRAZODONE HCL 50 MG PO TABS: 150.0000 mg | ORAL_TABLET | Freq: Every day | ORAL | Status: DC

## 2020-04-23 MED ORDER — METOPROLOL SUCCINATE ER 100 MG PO TB24
100.0000 mg | ORAL_TABLET | Freq: Every day | ORAL | Status: DC
  Administered 2020-04-24 – 2020-04-28 (×5): 100 mg via ORAL
  Filled 2020-04-23 (×5): qty 1

## 2020-04-23 MED ORDER — INSULIN ASPART 100 UNIT/ML ~~LOC~~ SOLN
0.0000 [IU] | Freq: Two times a day (BID) | SUBCUTANEOUS | Status: DC
  Administered 2020-04-24: 3 [IU] via SUBCUTANEOUS
  Administered 2020-04-24 – 2020-04-25 (×2): 4 [IU] via SUBCUTANEOUS
  Administered 2020-04-26 – 2020-04-28 (×2): 3 [IU] via SUBCUTANEOUS
  Filled 2020-04-23 (×5): qty 1

## 2020-04-23 MED ORDER — TORSEMIDE 20 MG PO TABS
80.0000 mg | ORAL_TABLET | Freq: Two times a day (BID) | ORAL | Status: DC
  Administered 2020-04-24 – 2020-04-28 (×8): 80 mg via ORAL
  Filled 2020-04-23 (×10): qty 4

## 2020-04-23 MED ORDER — DICLOFENAC SODIUM 1 % EX GEL
2.0000 g | Freq: Four times a day (QID) | CUTANEOUS | Status: DC | PRN
  Administered 2020-04-23 – 2020-04-27 (×6): 2 g via TOPICAL
  Filled 2020-04-23 (×2): qty 100

## 2020-04-23 MED ORDER — TORSEMIDE 20 MG PO TABS
80.0000 mg | ORAL_TABLET | Freq: Two times a day (BID) | ORAL | Status: DC
  Filled 2020-04-23: qty 4

## 2020-04-23 NOTE — Telephone Encounter (Signed)
Copied from CRM [id number]. Topic: General - Other >> Apr 23, 2020  2:53 PM [name] [name] wrote: Reason for CRM: Patient has made contact regarding [name] referral for [name] power wheelchair  Patient is inquiring about the delivery of their chair, patient would like to contact the company providing the chair and notify them that his roommate is there and able to accept delivery on his behalf  Patient had the number previously but has broken their phone and is unable to see it now   Please contact patient to advise further

## 2020-04-23 NOTE — Progress Notes (Signed)
PROGRESS NOTE    [name] [name]  YWV:[id number] DOB: [birth year] DOA: 04/06/2020 PCP: [name], DO   Brief Narrative: 60 year old male with supra morbid obesity, obesity hypoventilation syndrome and chronic hypoxic respiratory failure due to the same who presented to [facility] ED due to increased shortness of breath 4 hours prior to admission. His past medical history is as noted below. Per history he had myalgias and cough. He had been on his CPAP prior to presenting to the emergency room but found no relief from it. Patient was being evaluated in the emergency room and was placed on BiPAP. However shortly after being placed on BiPAP he became unresponsive and pulseless. He was noted to be in asystole andreceived ACLS protocols for approximately 10 minutes until ROSC was obtained. He was intubated during the procedure.  After ICU stay patient was successfully extubated.  He was found to have COVID-19 pneumonia and superimposed MRSA pneumonia.  Started on steroids, Zyvox, Lasix.  Presumptive diagnosis of cardiac arrest was respiratory arrest in the setting of COVID-19 pneumonia and possible obstructive sleep apnea, hypercapnic respiratory failure.  Patient has stabilized from a medical standpoint.  He remains significantly weak and deconditioned.  Therapy recommend skilled nursing facility but patient is adamantly refusing at this time.  Too weak to be DC'd home.    Assessment & Plan:   Active Problems:   Acute respiratory failure (HCC)   Pressure ulcer   Respiratory arrest (HCC)  Acute on chronic hypoxic & hypercapnic respiratory failure:  back to baseline oxygen level. Secondary to COVID19 pneumonia, superimposed MRSA pneumonia & pulmonary vascular congestion.  CTA chest was neg for PE. Patient is 25 L net negative since admission Bicarbonate uptrending Plan: DC IV Lasix Start torsemide per home dose Continue Foley for now Disposition plan unclear  Cardiac arrest: w/  asystole.  Suspect respiratory arrest.  ROSC obtained in 10 mins.   DM2: likely new onset.  HbA1c 6.6, well controlled.  --SSI  Transaminitis: etiology unclear, labile   Elevated troponins: likely secondary to demand ischemia   A. fib: likely PAF, w/ intermittent RVR. Acquired thrombophilia   --switched from warfarin to Eliquis to due pt's previous non-compliance with INR checks --cont metop --cont Eliquis  Acute on chronic systolic CHF:  echo shows EF 30-35%, normal diastolic function.  --Continue diuresis, switch to oral --Strict I/Os.   UTI: secondary to enterococcus faecium.  S/p zyvox  No further indication for antibiotics  Hyponatremia: resolved  Morbid obesity: BMI 69.  Complicates overall care and prognosis   Hypothyroidism --cont Synthroid  Generalized weakness:  PT/OT recs SNF, however, per TOC, currently not able to obtain [facility] services for pt nor place pt in SNF rehab.  Pt had also refused SNF rehab. --PT/OT as much as possible while inpatient --TOC to continue discuss options with pt  Right shoulder pain --cont home Soma TID, per pt request --xray of right shoulder wnl --MRI not possible due to body habitus --Outpatient orthopedic referral  Pressure ulcer right buttock stage 3, POA --bariatric bed --keep foley while aggressively diuresing to prevent more skin breakdown    DVT prophylaxis: Eliquis Code Status: Full Family Communication: None Disposition Plan: Status is: Inpatient  Remains inpatient appropriate because:Inpatient level of care appropriate due to severity of illness   Dispo: The patient is from: Home              Anticipated d/c is to: Home  Patient currently is not medically stable to d/c.   Difficult to place patient yes  Patient with recovering respiratory failure.  Remains very weak and debilitated.  Currently not safe to return home without home care.  No family members available to provide care.   Home health will be an issue.  TOC aware and is looking for options.       Level of care: Med-Surg  Consultants:   None  Procedures: (Don't include imaging studies which can be auto populated. Include things that cannot be auto populated i.e. Echo, Carotid and venous dopplers, Foley, Bipap, HD, tubes/drains, wound vac, central lines etc)  None  Antimicrobials: (specify start and planned stop date. Auto populated tables are space occupying and do not give end dates)  None   Subjective: Seen and examined.  Endorses right shoulder pain.  No other complaints.  Objective: Vitals:   04/22/20 2119 04/23/20 0400 04/23/20 0831 04/23/20 1138  BP: 140/84 111/65 (!) 151/89 (!) 148/89  Pulse: 96 77 (!) 50 (!) 52  Resp: 20 18 18 18   Temp: 98 F (36.7 C) 98.3 F (36.8 C) 98.6 F (37 C) 98.6 F (37 C)  TempSrc:      SpO2: 95% 100% 100% 96%  Weight:      Height:        Intake/Output Summary (Last 24 hours) at 04/23/2020 1521 Last data filed at 04/23/2020 0950 Gross per 24 hour  Intake 720 ml  Output 3350 ml  Net -2630 ml   Filed Weights   04/17/20 0410 04/18/20 0523 04/19/20 0122  Weight: (!) 206.4 kg (!) 195.5 kg (!) 208.2 kg    Examination:  General exam: No acute distress.  Appears weak and physically deconditioned Respiratory system: Poor respiratory efforts.  Lung sounds decreased at bases.  Normal work of breathing.  Room air Cardiovascular system: Distant heart sounds.  No appreciable murmurs Gastrointestinal system: Obese, nontender, nondistended, hypoactive bowel sounds  Central nervous system: Alert and oriented. No focal neurological deficits. Extremities: Symmetric 5 x 5 power. Skin: No rashes, lesions or ulcers Psychiatry: Judgement and insight appear normal. Mood & affect appropriate.     Data Reviewed: I have personally reviewed following labs and imaging studies  CBC: Recent Labs  Lab 04/17/20 0330 04/18/20 0530 04/19/20 0545 04/20/20 0507  04/21/20 0545 04/22/20 0709 04/23/20 0440  WBC 10.7* 10.7* 12.3* 9.1 9.1 8.0 7.8  NEUTROABS 9.5* 9.4*  --   --   --   --   --   HGB 11.8* 11.7* 11.6* 11.2* 11.4* 11.3* 11.1*  HCT 37.1* 36.7* 36.0* 34.4* 35.6* 34.0* 33.8*  MCV 98.1 99.2 98.4 98.6 99.2 97.1 96.6  PLT 202 204 210 186 198 183 185   Basic Metabolic Panel: Recent Labs  Lab 04/17/20 0330 04/18/20 0530 04/19/20 0545 04/20/20 0507 04/21/20 0545 04/22/20 0709 04/23/20 0440  NA 136 137 136 137 138 137 137  K 4.4 4.9 5.0 4.4 4.0 3.8 3.9  CL 91* 93* 92* 91* 89* 91* 92*  CO2 35* 37* 38* 40* 40* 38* 39*  GLUCOSE 216* 193* 207* 129* 154* 141* 143*  BUN 42* 39* 40* 36* 34* 33* 29*  CREATININE 0.95 0.79 0.81 0.68 0.76 0.78 0.75  CALCIUM 9.1 9.1 9.2 8.9 8.9 8.8* 8.7*  MG 2.2 2.0 2.1 2.1 2.0 2.1 2.0  PHOS 4.8* 4.0  --   --   --   --   --    GFR: Estimated Creatinine Clearance: 174.8 mL/min (by C-G formula  based on SCr of 0.75 mg/dL). Liver Function Tests: Recent Labs  Lab 04/17/20 0330 04/18/20 0530  AST 51* 53*  ALT 71* 91*  ALKPHOS 69 61  BILITOT 1.1 0.8  PROT 6.1* 5.9*  ALBUMIN 3.0* 3.0*   No results for input(s): LIPASE, AMYLASE in the last 168 hours. No results for input(s): AMMONIA in the last 168 hours. Coagulation Profile: Recent Labs  Lab 04/17/20 0330 04/18/20 0530 04/19/20 0545 04/20/20 0507 04/21/20 0545  INR 1.3* 1.5* 1.6* 1.9* 1.4*   Cardiac Enzymes: No results for input(s): CKTOTAL, CKMB, CKMBINDEX, TROPONINI in the last 168 hours. BNP (last 3 results) No results for input(s): PROBNP in the last 8760 hours. HbA1C: No results for input(s): HGBA1C in the last 72 hours. CBG: Recent Labs  Lab 04/22/20 1144 04/22/20 1623 04/22/20 2124 04/23/20 0752 04/23/20 1227  GLUCAP 191* 177* 154* 105* 159*   Lipid Profile: No results for input(s): CHOL, HDL, LDLCALC, TRIG, CHOLHDL, LDLDIRECT in the last 72 hours. Thyroid Function Tests: No results for input(s): TSH, T4TOTAL, FREET4, T3FREE,  THYROIDAB in the last 72 hours. Anemia Panel: No results for input(s): VITAMINB12, FOLATE, FERRITIN, TIBC, IRON, RETICCTPCT in the last 72 hours. Sepsis Labs: No results for input(s): PROCALCITON, LATICACIDVEN in the last 168 hours.  No results found for this or any previous visit (from the past 240 hour(s)).       Radiology Studies: No results found.      Scheduled Meds: . apixaban  5 mg Oral BID  . aspirin  81 mg Oral Daily  . atorvastatin  40 mg Oral Daily  . carisoprodol  350 mg Oral TID  . Chlorhexidine Gluconate Cloth  6 each Topical Daily  . DULoxetine  20 mg Oral Daily  . furosemide  40 mg Intravenous BID  . guaiFENesin  1,200 mg Oral BID  . hydrocerin   Topical Daily  . insulin aspart  0-20 Units Subcutaneous TID PC & HS  . Ipratropium-Albuterol  1 puff Inhalation Q6H  . levothyroxine  75 mcg Oral Q0600  . mouth rinse  15 mL Mouth Rinse BID  . metoprolol tartrate  25 mg Oral BID  . montelukast  10 mg Oral QHS  . multivitamin with minerals  1 tablet Oral Daily  . predniSONE  20 mg Oral Q breakfast  . Ensure Max Protein  11 oz Oral BID  . traZODone  150 mg Oral QHS   Continuous Infusions: . sodium chloride 250 mL (04/13/20 2057)     LOS: 17 days    Time spent: 35 minutes    Tresa Moore, MD [facility] Pager [phone]  If 7PM-7AM, please contact night-coverage 04/23/2020, 3:21 PM

## 2020-04-23 NOTE — Progress Notes (Signed)
Patient verbalizes pain in right shoulder. Unable to have MRI due to size restrictions. Order received from Dr Georgeann Oppenheim to d/c mri and order votaren gel and a heating pad
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
## 2020-04-23 NOTE — Progress Notes (Signed)
Physical Therapy Treatment Patient Details Name: [name] [name] MRN: [id number] DOB: February 12, 1961 Today's Date: 04/23/2020    History of Present Illness Patient is a 60 year old male with supra morbid obesity, obesity hypoventilation syndrome and chronic hypoxic respiratory failure due to the same who presented to [facility] ED on 04/06/2020 due to increased shortness of breath 4 hours prior to admission. He had been on his CPAP prior to presenting to the emergency room but found no relief from it. Patient was being evaluated in the emergency room and was placed on BiPAP. However shortly after being placed on BiPAP he became unresponsive and pulseless.  He was noted to be in asystole and received ACLS protocols for approximately 10 minutes until ROSC was obtained. He was intubated during the procedure, and transfered to ICU intubated and mechanically ventilated.    PT Comments    PT/OT co-treat 2/2 to pt complexity and requiring +2 assist for safety. Pt is severely deconditioned and would not tolerate two extended separate sessions. He is A and O x 4 and cooperative throughout. Does voice frustrations about weakness and not "bouncing back" as quickly as previous admissions. He was able to exit R side of bed with min assist. Sat EOB x several minutes prior to standing and taking steps to recliner. Severely fatigued with prolonged rest to recover. Once recovered, pt was able to stand several times form recliner surface with +2 assist for safety. Tolerated standing ~ 10-15 sec prior to needing seated rest. Overall pt does demonstrate improving strength and abilities but is severely limited by endurance deficits. Some SOB and tachycardia during session but resolves quickly with seated rest. He was on 3 L o2 throughout session. Highly recommend SNF at DC however pt may not have any option but to return home. Acute PT will continue to progress pt per POC. He was sitting in recliner with hoyer pad under him and call bell  in reach. Did review there ex to perform throughout the day to promote increase strength and improve endorse   Follow Up Recommendations  SNF;Supervision for mobility/OOB;Supervision/Assistance - 24 hour     Equipment Recommendations  Other (comment) (ongoing assessment)    Recommendations for Other Services       Precautions / Restrictions Precautions Precautions: Fall Restrictions Weight Bearing Restrictions: No    Mobility  Bed Mobility Overal bed mobility: Needs Assistance Bed Mobility: Supine to Sit     Supine to sit: Min assist;Min guard;HOB elevated Sit to supine:  (HOB elevated ~ 40 degrees)   General bed mobility comments: increased time.    Transfers Overall transfer level: Needs assistance Equipment used: Rolling walker (2 wheeled) Transfers: Sit to/from Stand Sit to Stand: Mod assist;+2 physical assistance Stand pivot transfers: +2 physical assistance;From elevated surface;Min assist;Mod assist       General transfer comment: Pt stood 1 x EOB and 2 x from recliner  Ambulation/Gait Ambulation/Gait assistance: +2 safety/equipment;Mod assist Gait Distance (Feet): 4 Feet Assistive device: Rolling walker (2 wheeled) (bariatric) Gait Pattern/deviations: Step-to pattern Gait velocity: decreased   General Gait Details: pt was able to take steps to recliner but gets extremely fatigued with minimal activity. No LOB.       Balance Overall balance assessment: Needs assistance Sitting-balance support: Feet supported Sitting balance-Leahy Scale: Good Sitting balance - Comments: Pt was able to sit EOB unsupported   Standing balance support: Bilateral upper extremity supported;During functional activity Standing balance-Leahy Scale: Poor Standing balance comment: B UE support on RW as well as external support  of at least MIN A. Pt with some periods of Fair standing balance, but fatigues quickly requiring external support.         Cognition  Arousal/Alertness: Awake/alert Behavior During Therapy: WFL for tasks assessed/performed Overall Cognitive Status: Within Functional Limits for tasks assessed      General Comments: Pt is A and O x 4 and cooperative. Motivated to get OOB however pt reports he does not feel strong enough. Requires extended time for encouragement. Sometimes inappropriate requiring re-direction      Exercises Other Exercises Other Exercises: OT facilitates ed re: importance of core exercise in sitting/bed level to improve balance and transfers. Pt demos good understanding and engages in 5 modified situps at bed level with HOB elevated. Very limited tolerance with ~2 minute rest break after before engaging in above-listed mobility with PT/OT.        Pertinent Vitals/Pain Pain Assessment: 0-10 Pain Score: 7  Faces Pain Scale: Hurts even more Pain Location: R hip/shoulder Pain Descriptors / Indicators: Shooting;Discomfort;Crushing Pain Intervention(s): Limited activity within patient's tolerance;Monitored during session;Repositioned;Premedicated before session    Home Living Family/patient expects to be discharged to:: Private residence Living Arrangements: Non-relatives/Friends Available Help at Discharge: Available PRN/intermittently;Personal care attendant (PCA comes in 3 x/week, 2-4 hrs/day, to assist with ADL/IADL) Type of Home: House Home Access: Ramped entrance   Home Layout: One level Home Equipment: Walker - 4 wheels;Electric scooter;Shower seat;Hand held shower head;Wheelchair - power Additional Comments: Uses 4ww in home (short distances) and electric w/c for longer distance    Prior Function Level of Independence: Needs assistance  Gait / Transfers Assistance Needed: Pt was using bari rollator previously for transfers and scooter for fxl mobility, but decreased tolerance with several recent hospital admissions. ADL's / Homemaking Assistance Needed: caretaker assists with bathing, shopping,  cooking Comments: Has transport for MD appts   PT Goals (current goals can now be found in the care plan section) Acute Rehab PT Goals Patient Stated Goal: get stronger so I can go home." Progress towards PT goals: Progressing toward goals    Frequency    Min 2X/week      PT Plan Current plan remains appropriate    Co-evaluation   Reason for Co-Treatment: To address functional/ADL transfers;For patient/therapist safety;Necessary to address cognition/behavior during functional activity PT goals addressed during session: Balance;Mobility/safety with mobility;Strengthening/ROM OT goals addressed during session: ADL's and self-care      AM-PAC PT "6 Clicks" Mobility   Outcome Measure  Help needed turning from your back to your side while in a flat bed without using bedrails?: A Little Help needed moving from lying on your back to sitting on the side of a flat bed without using bedrails?: A Lot Help needed moving to and from a bed to a chair (including a wheelchair)?: A Lot Help needed standing up from a chair using your arms (e.g., wheelchair or bedside chair)?: A Lot Help needed to walk in hospital room?: Total Help needed climbing 3-5 steps with a railing? : Total 6 Click Score: 11    End of Session Equipment Utilized During Treatment: Oxygen Activity Tolerance: Patient limited by fatigue Patient left: in chair;with call bell/phone within reach;with chair alarm set Nurse Communication: Mobility status PT Visit Diagnosis: Other abnormalities of gait and mobility (R26.89);Muscle weakness (generalized) (M62.81);Difficulty in walking, not elsewhere classified (R26.2);Unsteadiness on feet (R26.81)     Time: 5462-7035 PT Time Calculation (min) (ACUTE ONLY): 32 min  Charges:  $Therapeutic Activity: 8-22 mins  Jetta Lout PTA 04/23/20, 11:54 AM

## 2020-04-23 NOTE — Care Management (Signed)

## 2020-04-23 NOTE — Telephone Encounter (Signed)
I returned the patient phone call and gave him [employer] contact information.

## 2020-04-23 NOTE — Progress Notes (Signed)
Patient is requesting to have a "break" from CBG checks because his fingers are so sore. Dr Georgeann Oppenheim changed CBG to bed, 0800 and 1800

## 2020-04-23 NOTE — Evaluation (Signed)
Occupational Therapy Re-Evaluation Patient Details Name: [name] [name] MRN: [id number] DOB: 1960-12-01 Today's Date: 04/23/2020    History of Present Illness Patient is a 60 year old male with supra morbid obesity, obesity hypoventilation syndrome and chronic hypoxic respiratory failure due to the same who presented to [facility] ED on 04/06/2020 due to increased shortness of breath 4 hours prior to admission. He had been on his CPAP prior to presenting to the emergency room but found no relief from it. Patient was being evaluated in the emergency room and was placed on BiPAP. However shortly after being placed on BiPAP he became unresponsive and pulseless.  He was noted to be in asystole and received ACLS protocols for approximately 10 minutes until ROSC was obtained. He was intubated during the procedure, and transfered to ICU intubated and mechanically ventilated.   Clinical Impression   Pt seen for OT re-evaluation this date in setting of prolonged hospitalization. Pt generally feeling weak, but does feel he has progressed some with his activity tolerance. On assessment, he is noted to have very little tolerance for R hip ROM which is his baseline 2/2 chronic pain. Pt's UEs mostly functional with some decreased shld ROM. Pt reports he is usually MOD I with fxl mobility with scooter, but has had less tolerance for transfers with rollator as he was getting sick before requiring hospitalization. OT educates re: importance of OOB activity and core strenhgthening for balance and transfers and engages pt in below-listed exrecise. In addition, d/t low tolerance, CO-Tx completed with PT/OT to engage pt in fxl transfers. Pt requires MIN/MOD A +2 for sit to stand from elevated bed surface and requires MOD A +2 to CTS from lower chair surface. Pt demos P to F static standing balance with UE support. Pt tolerates 3 trials for ~5-10 seconds each demonstrating progress with transfers and standing, but still very limited  and unable to reasonably complete a functional task in standing. Pt requiring significant amount of assist for LB ADLs (MAX to TOTAL) and SETUP to MIN A for seated UB ADLs (more assist for bathing/dressing d/t some decreased shoulder ROM). Will continue to follow pt. Continue to anticipate pt will require STR in SNF setting as he is requiring 2p assist for most aspects of mobilization and for some standing self care tasks.     Follow Up Recommendations  SNF    Equipment Recommendations  Other (comment) (defer to next level of care)    Recommendations for Other Services       Precautions / Restrictions Precautions Precautions: Fall Restrictions Weight Bearing Restrictions: No      Mobility Bed Mobility Overal bed mobility: Needs Assistance Bed Mobility: Supine to Sit     Supine to sit: Min assist;Min guard;HOB elevated (use of bed rails, HOB ~40 degrees)     General bed mobility comments: increased time.    Transfers Overall transfer level: Needs assistance Equipment used: Rolling walker (2 wheeled) Transfers: Sit to/from [employer] Sit to Stand: Mod assist;+2 physical assistance (from chair) Stand pivot transfers: +2 physical assistance;From elevated surface;Min assist;Mod assist (from bed)       General transfer comment: Pt with improved ability to stand from bed surface, but still requires significant assist to stand from lower chair surface.    Balance Overall balance assessment: Needs assistance Sitting-balance support: Feet supported Sitting balance-Leahy Scale: Good Sitting balance - Comments: Pt was able to sit EOB unsupported   Standing balance support: Bilateral upper extremity supported;During functional activity Standing balance-Leahy Scale: Poor  Standing balance comment: B UE support on RW as well as external support of at least MIN A. Pt with some periods of Fair standing balance, but fatigues quickly requiring external support.                            ADL either performed or assessed with clinical judgement   ADL Overall ADL's : Needs assistance/impaired     Grooming: Wash/dry face;Min guard;Sitting   Upper Body Bathing: Minimal assistance;Sitting   Lower Body Bathing: Maximal assistance;Total assistance;Sit to/from stand   Upper Body Dressing : Minimal assistance;Sitting   Lower Body Dressing: Maximal assistance;Total assistance;Bed level Lower Body Dressing Details (indicate cue type and reason): for standing clothing mgt over hips, anticipate pt would require MOD A +2 to stand with RW and second person to assist with clothing as pt requires UE for standing balance. Pt requires MAX to TOTAL A at bed level to don socks, reports wearing slip on shoes at home. Toilet Transfer: Moderate assistance;+2 for safety/equipment;BSC;Requires wide/bariatric;Requires drop arm   Toileting- Clothing Manipulation and Hygiene: Total assistance;Sit to/from stand Toileting - Clothing Manipulation Details (indicate cue type and reason): pt will make minimal effort with extreme prompts, but reports he has a bidet at home and essentially requires TOTAL A to meaninfully clean peri area.             Vision Patient Visual Report: No change from baseline       Perception     Praxis      Pertinent Vitals/Pain Pain Assessment: Faces Faces Pain Scale: Hurts a little bit Pain Location: back of LEs from hoyer lift straps per patient Pain Descriptors / Indicators: Tender Pain Intervention(s): Limited activity within patient's tolerance;Monitored during session;Repositioned;Other (comment) (applied chux pads over hoyer lift pad to decrease liklihood of skin contact with straps)     Hand Dominance     Extremity/Trunk Assessment Upper Extremity Assessment Upper Extremity Assessment: Generalized weakness (shld ROM 3/4 range, mostly functional, but generally weak with limited tolerance for bearing weight through UEs on  bari RW.)   Lower Extremity Assessment Lower Extremity Assessment: Defer to PT evaluation;RLE deficits/detail;LLE deficits/detail RLE Deficits / Details: limited hip flex/ext d/t chronic pain, able to bend/straighten knee for quad sets LLE Deficits / Details: ROM is again limited, but strength better than R LE. More fxl.   Cervical / Trunk Assessment Cervical / Trunk Assessment:  (cervical kyphosis)   Communication Communication Communication: No difficulties   Cognition Arousal/Alertness: Awake/alert Behavior During Therapy: WFL for tasks assessed/performed Overall Cognitive Status: Within Functional Limits for tasks assessed                                 General Comments: Pt is A and O x 4 and cooperative. Motivated to get OOB however pt reports he does not feel strong enough. Requires extended time for encouragement. Sometimes inappropriate requiring re-direction   General Comments       Exercises Other Exercises Other Exercises: OT facilitates ed re: importance of core exercise in sitting/bed level to improve balance and transfers. Pt demos good understanding and engages in 5 modified situps at bed level with HOB elevated. Very limited tolerance with ~2 minute rest break after before engaging in above-listed mobility with PT/OT.   Shoulder Instructions      Home Living Family/patient expects to be discharged to:: Private residence Living Arrangements:  Non-relatives/Friends Available Help at Discharge: Available PRN/intermittently;Personal care attendant (PCA comes in 3 x/week, 2-4 hrs/day, to assist with ADL/IADL) Type of Home: House Home Access: Ramped entrance     Home Layout: One level     Bathroom Shower/Tub: Producer, television/film/video: Standard     Home Equipment: Environmental consultant - 4 wheels;Electric scooter;Shower seat;Hand held shower head;Wheelchair - power   Additional Comments: Uses 4ww in home (short distances) and electric w/c for longer  distance      Prior Functioning/Environment Level of Independence: Needs assistance  Gait / Transfers Assistance Needed: Pt was using bari rollator previously for transfers and scooter for fxl mobility, but decreased tolerance with several recent hospital admissions. ADL's / Homemaking Assistance Needed: caretaker assists with bathing, shopping, cooking   Comments: Has transport for MD appts        OT Problem List: Decreased safety awareness;Impaired balance (sitting and/or standing);Decreased activity tolerance;Cardiopulmonary status limiting activity;Decreased knowledge of precautions;Decreased range of motion;Decreased strength;Decreased knowledge of use of DME or AE;Increased edema;Obesity      OT Treatment/Interventions: Self-care/ADL training;Therapeutic exercise;Patient/family education;Balance training;Therapeutic activities;DME and/or AE instruction    OT Goals(Current goals can be found in the care plan section) Acute Rehab OT Goals Patient Stated Goal: get stronger and eventually be able to go home (pt now agreeable to SNF) OT Goal Formulation: With patient Time For Goal Achievement: 05/07/20 Potential to Achieve Goals: Fair ADL Goals Pt Will Perform Upper Body Dressing: with set-up;sitting Pt Will Transfer to Toilet: with min assist;stand pivot transfer;bedside commode (with LRAD and drop arm commode as needed.) Pt/caregiver will Perform Home Exercise Program: Increased strength;Both right and left upper extremity;With minimal assist  OT Frequency: Min 1X/week   Barriers to D/C: Decreased caregiver support  pt reports he's had PCAs       Co-evaluation PT/OT/SLP Co-Evaluation/Treatment: Yes Reason for Co-Treatment: For patient/therapist safety;To address functional/ADL transfers PT goals addressed during session: Mobility/safety with mobility OT goals addressed during session: ADL's and self-care      AM-PAC OT "6 Clicks" Daily Activity     Outcome Measure Help  from another person eating meals?: None Help from another person taking care of personal grooming?: A Little Help from another person toileting, which includes using toliet, bedpan, or urinal?: Total Help from another person bathing (including washing, rinsing, drying)?: Total Help from another person to put on and taking off regular upper body clothing?: A Lot Help from another person to put on and taking off regular lower body clothing?: A Lot 6 Click Score: 13   End of Session Equipment Utilized During Treatment: Rolling walker Nurse Communication: Mobility status;Other (comment) (notified pt c/o straps from hoyer hurting his skin.)  Activity Tolerance: Patient tolerated treatment well Patient left: in chair;with call bell/phone within reach;Other (comment) (with hoyer pad and chux pads)  OT Visit Diagnosis: Unsteadiness on feet (R26.81);Repeated falls (R29.6);Muscle weakness (generalized) (M62.81)                Time: 2694-8546 OT Time Calculation (min): 32 min Charges:  OT General Charges $OT Visit: 1 Visit OT Evaluation $OT Re-eval: 1 Re-eval OT Treatments $Therapeutic Exercise: 8-22 mins  Rejeana Brock, MS, OTR/L ascom [phone] 04/23/20, 10:42 AM

## 2020-04-23 NOTE — Progress Notes (Signed)
Order received from Dr Sreenath to discontinue telemetry °

## 2020-04-24 ENCOUNTER — Encounter: Payer: Medicaid Other | Admitting: Occupational Therapy

## 2020-04-24 LAB — GLUCOSE, CAPILLARY
Glucose-Capillary: 132 mg/dL — ABNORMAL HIGH (ref 70–99)
Glucose-Capillary: 152 mg/dL — ABNORMAL HIGH (ref 70–99)
Glucose-Capillary: 193 mg/dL — ABNORMAL HIGH (ref 70–99)

## 2020-04-24 LAB — MAGNESIUM: Magnesium: 2 mg/dL (ref 1.7–2.4)

## 2020-04-24 NOTE — TOC Progression Note (Signed)
Transition of Care [facility]) - Progression Note    Patient Details  Name: [name] [name] MRN: [id number] Date of Birth: 11-20-1960  [facility] CM/SW Contact  Chapman Fitch, RN Phone Number: 04/24/2020, 10:48 AM  Clinical Narrative:     Per MD anticipated DC for tomorrow  EMS packet on chart [facility] with [facility] notified  Per Zack with [employer] patient has qualified for trilogy.  Trilogy and bariatric BSC to be delivered to home.  I notified Zack of anticipated DC dayte   Expected Discharge Plan: Home w Home Health Services Barriers to Discharge: Continued Medical Work up  Expected Discharge Plan and Services Expected Discharge Plan: Home w Home Health Services   Discharge Planning Services: CM Consult   Living arrangements for the past 2 months: Single Family Home                   DME Agency: [employer]       HH Arranged: PT,OT HH Agency: [facility] Date [facility] Agency Contacted: 04/23/20   Representative spoke with at [facility] Agency: Kandee Keen   Social Determinants of Health (SDOH) Interventions    Readmission Risk Interventions Readmission Risk Prevention Plan 01/11/2020 12/24/2019 01/25/2019  Transportation Screening Complete Complete Complete  PCP or Specialist Appt within 3-5 Days - - -  HRI or Home Care Consult - - -  Social Work Consult for Recovery Care Planning/Counseling - - -  Palliative Care Screening - - -  Medication Review Oceanographer) Complete Complete Complete  PCP or Specialist appointment within 3-5 days of discharge Complete Complete Complete  HRI or Home Care Consult - - Complete  SW Recovery Care/Counseling Consult Complete Complete Complete  Palliative Care Screening Complete Not Applicable Not Applicable  Skilled Nursing Facility Not Applicable Not Applicable Not Applicable

## 2020-04-24 NOTE — Progress Notes (Signed)
PROGRESS NOTE    [name] Voeltz  KCL:[id number] DOB: [birth year] DOA: 04/06/2020 PCP: [name], DO   Brief Narrative: 60 year old male with supra morbid obesity, obesity hypoventilation syndrome and chronic hypoxic respiratory failure due to the same who presented to [facility] ED due to increased shortness of breath 4 hours prior to admission. His past medical history is as noted below. Per history he had myalgias and cough. He had been on his CPAP prior to presenting to the emergency room but found no relief from it. Patient was being evaluated in the emergency room and was placed on BiPAP. However shortly after being placed on BiPAP he became unresponsive and pulseless. He was noted to be in asystole andreceived ACLS protocols for approximately 10 minutes until ROSC was obtained. He was intubated during the procedure.  After ICU stay patient was successfully extubated.  He was found to have COVID-19 pneumonia and superimposed MRSA pneumonia.  Started on steroids, Zyvox, Lasix.  Presumptive diagnosis of cardiac arrest was respiratory arrest in the setting of COVID-19 pneumonia and possible obstructive sleep apnea, hypercapnic respiratory failure.  Patient has stabilized from a medical standpoint.  He remains significantly weak and deconditioned.  Therapy recommend skilled nursing facility but patient is adamantly refusing at this time.  Too weak to be DC'd home.  Revisited the issue of skilled nursing facility with patient again.  Unfortunately due to his insurance status he would need to commit to 30 days at rehab and signed over his check.  He is unwilling to do so.  I lengthy conversation about his progression and we were able to establish home health PT and OT services.    Assessment & Plan:   Active Problems:   Acute respiratory failure (HCC)   Pressure ulcer   Respiratory arrest (HCC)  Acute on chronic hypoxic & hypercapnic respiratory failure:  back to baseline oxygen  level. Secondary to COVID19 pneumonia, superimposed MRSA pneumonia & pulmonary vascular congestion.  CTA chest was neg for PE. Patient is 25 L net negative since admission Bicarbonate uptrending Plan: Continue torsemide per home dose DC Foley Voiding trial Possible discharge on 04/25/2020   Cardiac arrest: w/ asystole.  Suspect respiratory arrest.  ROSC obtained in 10 mins.   DM2: likely new onset.  HbA1c 6.6, well controlled.  --SSI  Transaminitis: etiology unclear, labile   Elevated troponins: likely secondary to demand ischemia   A. fib: likely PAF, w/ intermittent RVR. Acquired thrombophilia   --switched from warfarin to Eliquis to due pt's previous non-compliance with INR checks --cont metop --cont Eliquis  Acute on chronic systolic CHF:  echo shows EF 30-35%, normal diastolic function.  --Continue diuresis, switch to oral --Strict I/Os.   UTI: secondary to enterococcus faecium.  S/p zyvox  No further indication for antibiotics  Hyponatremia: resolved  Morbid obesity: BMI 69.  Complicates overall care and prognosis   Hypothyroidism --cont Synthroid  Generalized weakness:  PT/OT recs SNF, however, per TOC, currently not able to obtain [facility] services for pt nor place pt in SNF rehab.  Pt had also refused SNF rehab. --PT/OT as much as possible while inpatient --TOC to continue discuss options with pt --Patient electing to go home.  We were able to secure home health services including PT and OT  Right shoulder pain --cont home Soma TID, per pt request --xray of right shoulder wnl --MRI not possible due to body habitus --Outpatient orthopedic referral  Pressure ulcer right buttock stage 3, POA --bariatric bed --keep foley while aggressively  diuresing to prevent more skin breakdown    DVT prophylaxis: Eliquis Code Status: Full Family Communication: None Disposition Plan: Status is: Inpatient  Remains inpatient appropriate because:Inpatient  level of care appropriate due to severity of illness   Dispo: The patient is from: Home              Anticipated d/c is to: Home              Patient currently is not medically stable to d/c.   Difficult to place patient yes  Patient remains very weak but has recovered substantially.  Possible discharge in 24 hours.       Level of care: Med-Surg  Consultants:   None  Procedures:   None  Antimicrobials:  None   Subjective: Seen and examined.  Reports improvement in strength and mobility.  No other complaints  Objective: Vitals:   04/24/20 0620 04/24/20 1003 04/24/20 1102 04/24/20 1127  BP: 117/80 140/82 (!) 160/122 133/88  Pulse: 83 67 88 62  Resp: 18 16 20    Temp: 97.6 F (36.4 C) 98.2 F (36.8 C) 98.2 F (36.8 C)   TempSrc:      SpO2: 100% 100% 97%   Weight: (!) 196 kg     Height:        Intake/Output Summary (Last 24 hours) at 04/24/2020 1405 Last data filed at 04/24/2020 1229 Gross per 24 hour  Intake -  Output 4750 ml  Net -4750 ml   Filed Weights   04/18/20 0523 04/19/20 0122 04/24/20 0620  Weight: (!) 195.5 kg (!) 208.2 kg (!) 196 kg    Examination:  General exam: No acute distress.  Appears weak and physically deconditioned Respiratory system: Poor respiratory efforts.  Lung sounds decreased at bases.  Normal work of breathing.  Room air Cardiovascular system: Distant heart sounds.  No appreciable murmurs Gastrointestinal system: Obese, nontender, nondistended, hypoactive bowel sounds  Central nervous system: Alert and oriented. No focal neurological deficits. Extremities: Symmetric 5 x 5 power. Skin: No rashes, lesions or ulcers Psychiatry: Judgement and insight appear normal. Mood & affect appropriate.     Data Reviewed: I have personally reviewed following labs and imaging studies  CBC: Recent Labs  Lab 04/18/20 0530 04/19/20 0545 04/20/20 0507 04/21/20 0545 04/22/20 0709 04/23/20 0440  WBC 10.7* 12.3* 9.1 9.1 8.0 7.8   NEUTROABS 9.4*  --   --   --   --   --   HGB 11.7* 11.6* 11.2* 11.4* 11.3* 11.1*  HCT 36.7* 36.0* 34.4* 35.6* 34.0* 33.8*  MCV 99.2 98.4 98.6 99.2 97.1 96.6  PLT 204 210 186 198 183 185   Basic Metabolic Panel: Recent Labs  Lab 04/18/20 0530 04/19/20 0545 04/20/20 0507 04/21/20 0545 04/22/20 0709 04/23/20 0440 04/24/20 0507  NA 137 136 137 138 137 137  --   K 4.9 5.0 4.4 4.0 3.8 3.9  --   CL 93* 92* 91* 89* 91* 92*  --   CO2 37* 38* 40* 40* 38* 39*  --   GLUCOSE 193* 207* 129* 154* 141* 143*  --   BUN 39* 40* 36* 34* 33* 29*  --   CREATININE 0.79 0.81 0.68 0.76 0.78 0.75  --   CALCIUM 9.1 9.2 8.9 8.9 8.8* 8.7*  --   MG 2.0 2.1 2.1 2.0 2.1 2.0 2.0  PHOS 4.0  --   --   --   --   --   --    GFR: Estimated Creatinine Clearance:  167.9 mL/min (by C-G formula based on SCr of 0.75 mg/dL). Liver Function Tests: Recent Labs  Lab 04/18/20 0530  AST 53*  ALT 91*  ALKPHOS 61  BILITOT 0.8  PROT 5.9*  ALBUMIN 3.0*   No results for input(s): LIPASE, AMYLASE in the last 168 hours. No results for input(s): AMMONIA in the last 168 hours. Coagulation Profile: Recent Labs  Lab 04/18/20 0530 04/19/20 0545 04/20/20 0507 04/21/20 0545  INR 1.5* 1.6* 1.9* 1.4*   Cardiac Enzymes: No results for input(s): CKTOTAL, CKMB, CKMBINDEX, TROPONINI in the last 168 hours. BNP (last 3 results) No results for input(s): PROBNP in the last 8760 hours. HbA1C: No results for input(s): HGBA1C in the last 72 hours. CBG: Recent Labs  Lab 04/22/20 2124 04/23/20 0752 04/23/20 1227 04/24/20 0747 04/24/20 1053  GLUCAP 154* 105* 159* 132* 152*   Lipid Profile: No results for input(s): CHOL, HDL, LDLCALC, TRIG, CHOLHDL, LDLDIRECT in the last 72 hours. Thyroid Function Tests: No results for input(s): TSH, T4TOTAL, FREET4, T3FREE, THYROIDAB in the last 72 hours. Anemia Panel: No results for input(s): VITAMINB12, FOLATE, FERRITIN, TIBC, IRON, RETICCTPCT in the last 72 hours. Sepsis Labs: No  results for input(s): PROCALCITON, LATICACIDVEN in the last 168 hours.  No results found for this or any previous visit (from the past 240 hour(s)).       Radiology Studies: No results found.      Scheduled Meds: . apixaban  5 mg Oral BID  . aspirin  81 mg Oral Daily  . atorvastatin  40 mg Oral Daily  . carisoprodol  350 mg Oral TID  . Chlorhexidine Gluconate Cloth  6 each Topical Daily  . DULoxetine  20 mg Oral Daily  . guaiFENesin  1,200 mg Oral BID  . hydrocerin   Topical Daily  . insulin aspart  0-20 Units Subcutaneous BID WC  . Ipratropium-Albuterol  1 puff Inhalation Q6H  . levothyroxine  75 mcg Oral Q0600  . mouth rinse  15 mL Mouth Rinse BID  . metoprolol succinate  100 mg Oral Daily  . montelukast  10 mg Oral QHS  . multivitamin with minerals  1 tablet Oral Daily  . Ensure Max Protein  11 oz Oral BID  . torsemide  80 mg Oral BID  . traZODone  150 mg Oral QHS   Continuous Infusions: . sodium chloride 250 mL (04/13/20 2057)     LOS: 18 days    Time spent: 15 minutes    Tresa Moore, MD [facility] Pager [phone]  If 7PM-7AM, please contact night-coverage 04/24/2020, 2:05 PM

## 2020-04-24 NOTE — Progress Notes (Signed)
Physical Therapy Treatment Patient Details Name: [name] [name] MRN: [id number] DOB: Mar 03, 1960 Today's Date: 04/24/2020    History of Present Illness Patient is a 60 year old male with supra morbid obesity, obesity hypoventilation syndrome and chronic hypoxic respiratory failure due to the same who presented to [facility] ED on 04/06/2020 due to increased shortness of breath 4 hours prior to admission. He had been on his CPAP prior to presenting to the emergency room but found no relief from it. Patient was being evaluated in the emergency room and was placed on BiPAP. However shortly after being placed on BiPAP he became unresponsive and pulseless.  He was noted to be in asystole and received ACLS protocols for approximately 10 minutes until ROSC was obtained. He was intubated during the procedure, and transfered to ICU intubated and mechanically ventilated.    PT Comments    Pt was seated EOB and eager to participate in PT upon arriving. Lengthy discussion about DC disposition and recommendation of rehab. " I can't afford to sign my check over for rehab." Discussed barriers for safe DC to home. " I feel I can go once I can stand and get to my chair on my own" Pt did demonstrate great strength and endurance improvements from previous date. Stood from his lowest bed height(still higher than standard height) with CGA of one. He progressed to take steps towards window of room prior to taking steps to turn to sit in recliner. All performed with CGA only. Seated rest for ~ 3 -4 minutes prior to standing 2 x from lower recliner height surface with CGA only. Pt has poor standing posture throughout all standing activity however did not have any LOB or unsteadiness. Poor endurance limits pt's ability to ambulate further distances. He was seated in recliner at conclusion of session with call bell in reach, hoyer pad placed under him to assist RN staff if pt is unable to stand pivot back to bed. Author feels pt should  attempt standing and taking steps to return to bed later this date  prior to use of mechanical lift. Acute PT will continue current POC progressing as able per pt tolerance.    Follow Up Recommendations  SNF;Other (comment) (PT recommends SNF however pt unwilling to sign check over for rehab. He is agreeable to [facility] services at DC.)     Equipment Recommendations  Other (comment) (Bariatric BSC. Pt states " I have everything else waiting on me at home.")       Precautions / Restrictions Precautions Precautions: Fall Restrictions Weight Bearing Restrictions: No    Mobility  Bed Mobility Overal bed mobility: Modified Independent             General bed mobility comments: Pt was already sitting EOB upon arriving. He reports he was able to achieve wiothout assistance. did use bedrails. discussed getting hospital bed at home however he was unwilling. " I have what I need at home already."    Transfers Overall transfer level: Needs assistance Equipment used: Rolling walker (2 wheeled) (Bariatric) Transfers: Sit to/from Stand Sit to Stand: Min guard Stand pivot transfers: Min guard       General transfer comment: pt was able to stand 3 x throughout session with CGA only. Demonstrated much improved strength/activity tolerance overall. took steps from EOB to recliner prior to standing 2 x from reckliner height surface with vcs and CGA only.  Ambulation/Gait Ambulation/Gait assistance: Min guard Gait Distance (Feet): 5 Feet Assistive device: Rolling walker (2 wheeled) (Bariatric) Gait Pattern/deviations:  Step-to pattern Gait velocity: decreased   General Gait Details: pt was able to take ~ 5 steps from EOB to window of room prior to takeing astep to turn to recliner. Pt has overall poor posture but was able to perform with CGA only. Overall much less fatigued after performing all standing activity this date       Balance Overall balance assessment: Needs  assistance Sitting-balance support: Feet supported Sitting balance-Leahy Scale: Good Sitting balance - Comments: Pt was able to sit EOB unsupported   Standing balance support: Bilateral upper extremity supported;During functional activity Standing balance-Leahy Scale: Fair Standing balance comment: CGA for all standing activity           Cognition Arousal/Alertness: Awake/alert Behavior During Therapy: WFL for tasks assessed/performed Overall Cognitive Status: Within Functional Limits for tasks assessed          General Comments: Pt is A and O x 4 and cooperative. Motivated to get OOB however pt reports he does not feel strong enough. Requires extended time for encouragement. Sometimes inappropriate requiring re-direction             Pertinent Vitals/Pain Pain Assessment: 0-10 Pain Score: 4  Faces Pain Scale: Hurts a little bit Pain Location: R hip/shoulder Pain Descriptors / Indicators: Shooting;Discomfort;Crushing Pain Intervention(s): Limited activity within patient's tolerance;Monitored during session;Premedicated before session;Repositioned           PT Goals (current goals can now be found in the care plan section) Acute Rehab PT Goals Patient Stated Goal: get stronger so I can go home." Progress towards PT goals: Progressing toward goals    Frequency    Min 2X/week      PT Plan Current plan remains appropriate       AM-PAC PT "6 Clicks" Mobility   Outcome Measure  Help needed turning from your back to your side while in a flat bed without using bedrails?: A Little Help needed moving from lying on your back to sitting on the side of a flat bed without using bedrails?: A Little Help needed moving to and from a bed to a chair (including a wheelchair)?: A Little Help needed standing up from a chair using your arms (e.g., wheelchair or bedside chair)?: A Little Help needed to walk in hospital room?: A Lot Help needed climbing 3-5 steps with a railing? :  Total 6 Click Score: 15    End of Session Equipment Utilized During Treatment: Oxygen (pt on 3 L o2 at home PTA) Activity Tolerance: Patient tolerated treatment well;Patient limited by fatigue Patient left: in chair;with call bell/phone within reach;with chair alarm set Nurse Communication: Mobility status PT Visit Diagnosis: Other abnormalities of gait and mobility (R26.89);Muscle weakness (generalized) (M62.81);Difficulty in walking, not elsewhere classified (R26.2);Unsteadiness on feet (R26.81)     Time: 8119-1478 PT Time Calculation (min) (ACUTE ONLY): 34 min  Charges:  $Therapeutic Activity: 23-37 mins                     Jetta Lout PTA 04/24/20, 9:51 AM

## 2020-04-25 ENCOUNTER — Ambulatory Visit: Payer: Medicaid Other | Admitting: Family

## 2020-04-25 LAB — CBC WITH DIFFERENTIAL/PLATELET
Abs Immature Granulocytes: 0.06 10*3/uL (ref 0.00–0.07)
Basophils Absolute: 0 10*3/uL (ref 0.0–0.1)
Basophils Relative: 0 %
Eosinophils Absolute: 0.2 10*3/uL (ref 0.0–0.5)
Eosinophils Relative: 3 %
HCT: 35.8 % — ABNORMAL LOW (ref 39.0–52.0)
Hemoglobin: 11.6 g/dL — ABNORMAL LOW (ref 13.0–17.0)
Immature Granulocytes: 1 %
Lymphocytes Relative: 21 %
Lymphs Abs: 1.5 10*3/uL (ref 0.7–4.0)
MCH: 32.1 pg (ref 26.0–34.0)
MCHC: 32.4 g/dL (ref 30.0–36.0)
MCV: 99.2 fL (ref 80.0–100.0)
Monocytes Absolute: 0.6 10*3/uL (ref 0.1–1.0)
Monocytes Relative: 8 %
Neutro Abs: 5 10*3/uL (ref 1.7–7.7)
Neutrophils Relative %: 67 %
Platelets: 173 10*3/uL (ref 150–400)
RBC: 3.61 MIL/uL — ABNORMAL LOW (ref 4.22–5.81)
RDW: 17.3 % — ABNORMAL HIGH (ref 11.5–15.5)
WBC: 7.3 10*3/uL (ref 4.0–10.5)
nRBC: 0 % (ref 0.0–0.2)

## 2020-04-25 LAB — BASIC METABOLIC PANEL
Anion gap: 10 (ref 5–15)
BUN: 30 mg/dL — ABNORMAL HIGH (ref 6–20)
CO2: 39 mmol/L — ABNORMAL HIGH (ref 22–32)
Calcium: 8.6 mg/dL — ABNORMAL LOW (ref 8.9–10.3)
Chloride: 89 mmol/L — ABNORMAL LOW (ref 98–111)
Creatinine, Ser: 0.74 mg/dL (ref 0.61–1.24)
GFR, Estimated: >60 mL/min (ref >60)
Glucose, Bld: 128 mg/dL — ABNORMAL HIGH (ref 70–99)
Potassium: 3.1 mmol/L — ABNORMAL LOW (ref 3.5–5.1)
Sodium: 138 mmol/L (ref 135–145)

## 2020-04-25 LAB — MAGNESIUM: Magnesium: 1.7 mg/dL (ref 1.7–2.4)

## 2020-04-25 LAB — GLUCOSE, CAPILLARY
Glucose-Capillary: 159 mg/dL — ABNORMAL HIGH (ref 70–99)
Glucose-Capillary: 113 mg/dL — ABNORMAL HIGH (ref 70–99)

## 2020-04-25 MED ORDER — MAGNESIUM SULFATE 2 GM/50ML IV SOLN
2.0000 g | Freq: Once | INTRAVENOUS | Status: AC
  Administered 2020-04-25: 2 g via INTRAVENOUS
  Filled 2020-04-25: qty 50

## 2020-04-25 MED ORDER — CHLORHEXIDINE GLUCONATE CLOTH 2 % EX PADS
6.0000 | MEDICATED_PAD | Freq: Every day | CUTANEOUS | Status: DC
  Administered 2020-04-25 – 2020-04-28 (×4): 6 via TOPICAL

## 2020-04-25 NOTE — TOC Progression Note (Signed)
Transition of Care [facility]) - Progression Note    Patient Details  Name: [name] [name] MRN: [id number] Date of Birth: Jun 30, 1960  Transition [facility]) CM/SW Contact  Chapman Fitch, RN Phone Number: 04/25/2020, 9:47 AM  Clinical Narrative:     Per MD plan to DC Monday.  TOC leadership notified Zack with [employer] notified for trilogy and bariatric [facility]   Expected Discharge Plan: Home w Home Health Services Barriers to Discharge: Continued Medical Work up  Expected Discharge Plan and Services Expected Discharge Plan: Home w Home Health Services   Discharge Planning Services: CM Consult   Living arrangements for the past 2 months: Single Family Home                   DME Agency: [employer]       HH Arranged: PT,OT HH Agency: [facility] Date [facility] Agency Contacted: 04/23/20   Representative spoke with at [facility] Agency: Kandee Keen   Social Determinants of Health (SDOH) Interventions    Readmission Risk Interventions Readmission Risk Prevention Plan 01/11/2020 12/24/2019 01/25/2019  Transportation Screening Complete Complete Complete  PCP or Specialist Appt within 3-5 Days - - -  HRI or Home Care Consult - - -  Social Work Consult for Recovery Care Planning/Counseling - - -  Palliative Care Screening - - -  Medication Review [employer]) Complete Complete Complete  PCP or Specialist appointment within 3-5 days of discharge Complete Complete Complete  HRI or Home Care Consult - - Complete  SW Recovery Care/Counseling Consult Complete Complete Complete  Palliative Care Screening Complete Not Applicable Not Applicable  Skilled Nursing Facility Not Applicable Not Applicable Not Applicable

## 2020-04-25 NOTE — Progress Notes (Signed)
Bladder scan @0000  and 600 0 ml. Patient had measured 950 ml and 1 urine occurrence.

## 2020-04-25 NOTE — Progress Notes (Signed)
Physical Therapy Treatment Patient Details Name: [name] [name] MRN: [id number] DOB: 1960/07/01 Today's Date: 04/25/2020    History of Present Illness Patient is a 60 year old male with supra morbid obesity, obesity hypoventilation syndrome and chronic hypoxic respiratory failure due to the same who presented to [facility] ED on 04/06/2020 due to increased shortness of breath 4 hours prior to admission. He had been on his CPAP prior to presenting to the emergency room but found no relief from it. Patient was being evaluated in the emergency room and was placed on BiPAP. However shortly after being placed on BiPAP he became unresponsive and pulseless.  He was noted to be in asystole and received ACLS protocols for approximately 10 minutes until ROSC was obtained. He was intubated during the procedure, and transfered to ICU intubated and mechanically ventilated.    PT Comments    Pt was sitting in recliner finishing breakfast upon arriving. Her is A and O and agreeable to PT session. Pt is easily distracted and needs vcs to focus on desired task. He did demonstrated throughout session improved strength. Required less assistance to stand and was able to perform aternating marching in place 2 x 15 prior to needing seated rest. Pt is most limited by activity tolerance. Becomes SOB quickly with standing. Static standing trials, pt only able to tolerate 1 minute standing prior to requesting seated rest. Pt continues to be unwilling to DC to rehab due to finanical obligations. Would greatly benefit from continued skilled PT to address deficits while improving independence with ADLs.      Follow Up Recommendations  SNF;Other (comment) (Pt unwilling to go to SNF. Will benefit from [facility] services to improve safety with ADLs)     Equipment Recommendations  Other (comment) (Bariatric BSC/ would benefit from EMS transport home at DC due to activity tolerance deficits.)       Precautions / Restrictions  Precautions Precautions: Fall Restrictions Weight Bearing Restrictions: No    Mobility  Bed Mobility  General bed mobility comments: pt in recliner pre/post session    Transfers Overall transfer level: Needs assistance Equipment used: Rolling walker (2 wheeled) Transfers: Sit to/from Stand Sit to Stand: Supervision         General transfer comment: Pt stood 3 x from recliner with supervision only. poor standing posture throughout however pt did perform alternating marching in place 2 x ~15 and static standing x 1 minute. pt most limited by fatigue however continues to demonstarte improving strength. does require prolonged rest between transfer trials  Ambulation/Gait Ambulation/Gait assistance: Min guard Gait Distance (Feet):  (marching in place for safety. would need chair follow for safe ambulation away from EOB/chair. He has power chair at home and electric lift chair. Pt did not ambulate much at home prior to ambulation due to severely deconditioned) Assistive device: Rolling walker (2 wheeled) (Bariatric)              Balance Overall balance assessment: Needs assistance Sitting-balance support: Feet supported Sitting balance-Leahy Scale: Good     Standing balance support: Bilateral upper extremity supported;During functional activity Standing balance-Leahy Scale: Fair Standing balance comment: Heavy Upper body len on bariatric RW. untilling to have therapist order baraitric RW for home use. " I have a better walker at home already."           Cognition Arousal/Alertness: Awake/alert Behavior During Therapy: [facility] for tasks assessed/performed Overall Cognitive Status: Within Functional Limits for tasks assessed  General Comments: Pt was seated in recliner finishing breakfast upon arriving. He agrees to PT session and is cooperative throughout. Easily distracted and needs constant vcs to stay focused on desired task requested      Exercises General  Exercises - Lower Extremity Ankle Circles/Pumps: AROM;10 reps Long Arc Quad: AROM;10 reps Hip Flexion/Marching: AROM;15 reps;Both;Standing    General Comments        Pertinent Vitals/Pain Pain Assessment: 0-10 Pain Score:  (did not formally rate. C/O R hip shoulder pain and R side rib pain. Did not limit session. Fatigue limits) Pain Location: R hip/shoulder Pain Descriptors / Indicators: Shooting;Discomfort;Crushing Pain Intervention(s): Limited activity within patient's tolerance;Monitored during session;Premedicated before session;Repositioned           PT Goals (current goals can now be found in the care plan section) Acute Rehab PT Goals Patient Stated Goal: get stronger prior to going home Progress towards PT goals: Progressing toward goals    Frequency    Min 2X/week      PT Plan Current plan remains appropriate       AM-PAC PT "6 Clicks" Mobility   Outcome Measure  Help needed turning from your back to your side while in a flat bed without using bedrails?: A Little Help needed moving from lying on your back to sitting on the side of a flat bed without using bedrails?: A Little Help needed moving to and from a bed to a chair (including a wheelchair)?: A Little Help needed standing up from a chair using your arms (e.g., wheelchair or bedside chair)?: A Little Help needed to walk in hospital room?: A Little Help needed climbing 3-5 steps with a railing? : Total 6 Click Score: 16    End of Session Equipment Utilized During Treatment: Oxygen (2L) Activity Tolerance: Patient tolerated treatment well;Patient limited by fatigue Patient left: in chair;with call bell/phone within reach;with chair alarm set Nurse Communication: Mobility status PT Visit Diagnosis: Other abnormalities of gait and mobility (R26.89);Muscle weakness (generalized) (M62.81);Difficulty in walking, not elsewhere classified (R26.2);Unsteadiness on feet (R26.81)     Time: 9924-2683 PT Time  Calculation (min) (ACUTE ONLY): 27 min  Charges:  $Therapeutic Activity: 23-37 mins                     Jetta Lout PTA 04/25/20, 9:27 AM

## 2020-04-25 NOTE — Progress Notes (Signed)
PROGRESS NOTE    [name] [name]  BSJ:[id number] DOB: 1960/12/25 DOA: 04/06/2020 PCP: [name], DO   Brief Narrative: 60 year old male with supra morbid obesity, obesity hypoventilation syndrome and chronic hypoxic respiratory failure due to the same who presented to [facility] ED due to increased shortness of breath 4 hours prior to admission. His past medical history is as noted below. Per history he had myalgias and cough. He had been on his CPAP prior to presenting to the emergency room but found no relief from it. Patient was being evaluated in the emergency room and was placed on BiPAP. However shortly after being placed on BiPAP he became unresponsive and pulseless. He was noted to be in asystole andreceived ACLS protocols for approximately 10 minutes until ROSC was obtained. He was intubated during the procedure.  After ICU stay patient was successfully extubated.  He was found to have COVID-19 pneumonia and superimposed MRSA pneumonia.  Started on steroids, Zyvox, Lasix.  Presumptive diagnosis of cardiac arrest was respiratory arrest in the setting of COVID-19 pneumonia and possible obstructive sleep apnea, hypercapnic respiratory failure.  Patient has stabilized from a medical standpoint.  He remains significantly weak and deconditioned.  Therapy recommend skilled nursing facility but patient is adamantly refusing at this time.  Too weak to be DC'd home.  Revisited the issue of skilled nursing facility with patient again.  Unfortunately due to his insurance status he would need to commit to 30 days at rehab and signed over his check.  He is unwilling to do so.  I lengthy conversation about his progression and we were able to establish home health PT and OT services.  3/11: Patient is improving slowly but will benefit from continued inpatient PT services over the weekend.  Had a lengthy discussion with the patient and the physical therapist.  All in agreement with plan of care.   We will plan to monitor patient over the weekend, daily PT, discharge home with home health services tentatively Monday 3/14    Assessment & Plan:   Active Problems:   Acute respiratory failure (HCC)   Pressure ulcer   Respiratory arrest (HCC)  Acute on chronic hypoxic & hypercapnic respiratory failure:  back to baseline oxygen level. Secondary to COVID19 pneumonia, superimposed MRSA pneumonia & pulmonary vascular congestion.  CTA chest was neg for PE. Patient is 25 L net negative since admission Bicarbonate uptrending Foley discontinued, patient able to void spontaneously Plan: Diuresis per home dose   Cardiac arrest: w/ asystole.  Suspect respiratory arrest.  ROSC obtained in 10 mins.   DM2: likely new onset.  HbA1c 6.6, well controlled.  --SSI  Transaminitis: etiology unclear, labile   Elevated troponins: likely secondary to demand ischemia   A. fib: likely PAF, w/ intermittent RVR. Acquired thrombophilia   --switched from warfarin to Eliquis to due pt's previous non-compliance with INR checks --cont metop --cont Eliquis  Acute on chronic systolic CHF:  echo shows EF 30-35%, normal diastolic function.  --Continue diuresis, switch to oral --Strict I/Os.   UTI: secondary to enterococcus faecium.  S/p zyvox  No further indication for antibiotics  Hyponatremia: resolved  Morbid obesity: BMI 69.  Complicates overall care and prognosis   Hypothyroidism --cont Synthroid  Generalized weakness:  PT/OT recs SNF, however, per TOC, currently not able to obtain [facility] services for pt nor place pt in SNF rehab.  Pt had also refused SNF rehab. --PT/OT as much as possible while inpatient --TOC to continue discuss options with pt --Patient electing  to go home.  We were able to secure home health services including PT and OT  Right shoulder pain --cont home Soma TID, per pt request --xray of right shoulder wnl --MRI not possible due to body  habitus --Outpatient orthopedic referral  Pressure ulcer right buttock stage 3, POA --bariatric bed --Foley discontinued    DVT prophylaxis: Eliquis Code Status: Full Family Communication: None Disposition Plan: Status is: Inpatient  Remains inpatient appropriate because:Inpatient level of care appropriate due to severity of illness   Dispo: The patient is from: Home              Anticipated d/c is to: Home              Patient currently is not medically stable to d/c.   Difficult to place patient yes  Weakness improving.  Plan to monitor with intensive physical therapy over the weekend and discharge home with home health services on Monday 3/14   Level of care: Med-Surg  Consultants:   None  Procedures:   None  Antimicrobials:  None   Subjective: Patient seen and examined.  Reports persistent weakness but is slowly improving.  Objective: Vitals:   04/25/20 0343 04/25/20 0823 04/25/20 1151 04/25/20 1421  BP: (!) 111/51 126/80 130/68 120/76  Pulse: 81 (!) 101 97 96  Resp: (!) 22 20 20  (!) 21  Temp: 98 F (36.7 C) 98.1 F (36.7 C) 97.9 F (36.6 C) 98.5 F (36.9 C)  TempSrc: Oral     SpO2: 93% 99% (!) 89% 99%  Weight: (!) 194.6 kg     Height:        Intake/Output Summary (Last 24 hours) at 04/25/2020 1436 Last data filed at 04/25/2020 1205 Gross per 24 hour  Intake --  Output 3150 ml  Net -3150 ml   Filed Weights   04/19/20 0122 04/24/20 0620 04/25/20 0343  Weight: (!) 208.2 kg (!) 196 kg (!) 194.6 kg    Examination:  General exam: No acute distress.  Appears weak and physically deconditioned Respiratory system: Poor respiratory efforts.  Lung sounds decreased at bases.  Normal work of breathing.  Room air Cardiovascular system: Distant heart sounds.  No appreciable murmurs Gastrointestinal system: Obese, nontender, nondistended, hypoactive bowel sounds  Central nervous system: Alert and oriented. No focal neurological deficits. Extremities:  Symmetric 5 x 5 power. Skin: No rashes, lesions or ulcers Psychiatry: Judgement and insight appear normal. Mood & affect appropriate.     Data Reviewed: I have personally reviewed following labs and imaging studies  CBC: Recent Labs  Lab 04/20/20 0507 04/21/20 0545 04/22/20 0709 04/23/20 0440 04/25/20 0801  WBC 9.1 9.1 8.0 7.8 7.3  NEUTROABS  --   --   --   --  5.0  HGB 11.2* 11.4* 11.3* 11.1* 11.6*  HCT 34.4* 35.6* 34.0* 33.8* 35.8*  MCV 98.6 99.2 97.1 96.6 99.2  PLT 186 198 183 185 173   Basic Metabolic Panel: Recent Labs  Lab 04/20/20 0507 04/21/20 0545 04/22/20 0709 04/23/20 0440 04/24/20 0507 04/25/20 0425 04/25/20 0801  NA 137 138 137 137  --   --  138  K 4.4 4.0 3.8 3.9  --   --  3.1*  CL 91* 89* 91* 92*  --   --  89*  CO2 40* 40* 38* 39*  --   --  39*  GLUCOSE 129* 154* 141* 143*  --   --  128*  BUN 36* 34* 33* 29*  --   --  30*  CREATININE 0.68 0.76 0.78 0.75  --   --  0.74  CALCIUM 8.9 8.9 8.8* 8.7*  --   --  8.6*  MG 2.1 2.0 2.1 2.0 2.0 1.7  --    GFR: Estimated Creatinine Clearance: 167.2 mL/min (by C-G formula based on SCr of 0.74 mg/dL). Liver Function Tests: No results for input(s): AST, ALT, ALKPHOS, BILITOT, PROT, ALBUMIN in the last 168 hours. No results for input(s): LIPASE, AMYLASE in the last 168 hours. No results for input(s): AMMONIA in the last 168 hours. Coagulation Profile: Recent Labs  Lab 04/19/20 0545 04/20/20 0507 04/21/20 0545  INR 1.6* 1.9* 1.4*   Cardiac Enzymes: No results for input(s): CKTOTAL, CKMB, CKMBINDEX, TROPONINI in the last 168 hours. BNP (last 3 results) No results for input(s): PROBNP in the last 8760 hours. HbA1C: No results for input(s): HGBA1C in the last 72 hours. CBG: Recent Labs  Lab 04/24/20 0747 04/24/20 1053 04/24/20 1649 04/25/20 0727 04/25/20 1148  GLUCAP 132* 152* 193* 159* 113*   Lipid Profile: No results for input(s): CHOL, HDL, LDLCALC, TRIG, CHOLHDL, LDLDIRECT in the last 72  hours. Thyroid Function Tests: No results for input(s): TSH, T4TOTAL, FREET4, T3FREE, THYROIDAB in the last 72 hours. Anemia Panel: No results for input(s): VITAMINB12, FOLATE, FERRITIN, TIBC, IRON, RETICCTPCT in the last 72 hours. Sepsis Labs: No results for input(s): PROCALCITON, LATICACIDVEN in the last 168 hours.  No results found for this or any previous visit (from the past 240 hour(s)).       Radiology Studies: No results found.      Scheduled Meds: . apixaban  5 mg Oral BID  . aspirin  81 mg Oral Daily  . atorvastatin  40 mg Oral Daily  . carisoprodol  350 mg Oral TID  . Chlorhexidine Gluconate Cloth  6 each Topical Daily  . DULoxetine  20 mg Oral Daily  . guaiFENesin  1,200 mg Oral BID  . hydrocerin   Topical Daily  . insulin aspart  0-20 Units Subcutaneous BID WC  . Ipratropium-Albuterol  1 puff Inhalation Q6H  . levothyroxine  75 mcg Oral Q0600  . mouth rinse  15 mL Mouth Rinse BID  . metoprolol succinate  100 mg Oral Daily  . montelukast  10 mg Oral QHS  . multivitamin with minerals  1 tablet Oral Daily  . Ensure Max Protein  11 oz Oral BID  . torsemide  80 mg Oral BID  . traZODone  150 mg Oral QHS   Continuous Infusions: . sodium chloride 250 mL (04/13/20 2057)     LOS: 19 days    Time spent: 15 minutes    Tresa Moore, MD [facility] Pager [phone]  If 7PM-7AM, please contact night-coverage 04/25/2020, 2:36 PM

## 2020-04-26 LAB — GLUCOSE, CAPILLARY
Glucose-Capillary: 124 mg/dL — ABNORMAL HIGH (ref 70–99)
Glucose-Capillary: 170 mg/dL — ABNORMAL HIGH (ref 70–99)

## 2020-04-26 NOTE — Progress Notes (Signed)
PROGRESS NOTE    [name] [name]  ZPH:[id number] DOB: Dec 13, 1960 DOA: 04/06/2020 PCP: [name], DO   Brief Narrative: 60 year old male with supra morbid obesity, obesity hypoventilation syndrome and chronic hypoxic respiratory failure due to the same who presented to [facility] ED due to increased shortness of breath 4 hours prior to admission. His past medical history is as noted below. Per history he had myalgias and cough. He had been on his CPAP prior to presenting to the emergency room but found no relief from it. Patient was being evaluated in the emergency room and was placed on BiPAP. However shortly after being placed on BiPAP he became unresponsive and pulseless. He was noted to be in asystole andreceived ACLS protocols for approximately 10 minutes until ROSC was obtained. He was intubated during the procedure.  After ICU stay patient was successfully extubated.  He was found to have COVID-19 pneumonia and superimposed MRSA pneumonia.  Started on steroids, Zyvox, Lasix.  Presumptive diagnosis of cardiac arrest was respiratory arrest in the setting of COVID-19 pneumonia and possible obstructive sleep apnea, hypercapnic respiratory failure.  Patient has stabilized from a medical standpoint.  He remains significantly weak and deconditioned.  Therapy recommend skilled nursing facility but patient is adamantly refusing at this time.  Too weak to be DC'd home.  Revisited the issue of skilled nursing facility with patient again.  Unfortunately due to his insurance status he would need to commit to 30 days at rehab and signed over his check.  He is unwilling to do so.  I lengthy conversation about his progression and we were able to establish home health PT and OT services.  3/11: Patient is improving slowly but will benefit from continued inpatient PT services over the weekend.  Had a lengthy discussion with the patient and the physical therapist.  All in agreement with plan of care.   We will plan to monitor patient over the weekend, daily PT, discharge home with home health services tentatively Monday 3/14    Assessment & Plan:   Active Problems:   Acute respiratory failure (HCC)   Pressure ulcer   Respiratory arrest (HCC)  Acute on chronic hypoxic & hypercapnic respiratory failure:  back to baseline oxygen level. Secondary to COVID19 pneumonia, superimposed MRSA pneumonia & pulmonary vascular congestion.  CTA chest was neg for PE. Patient is 25 L net negative since admission Bicarbonate uptrending Foley discontinued, patient able to void spontaneously Plan: Continue diuresis per home dose   Cardiac arrest: w/ asystole.  Suspect respiratory arrest.  ROSC obtained in 10 mins.   DM2: likely new onset.  HbA1c 6.6, well controlled.  --SSI  Transaminitis: etiology unclear, labile   Elevated troponins: likely secondary to demand ischemia   A. fib: likely PAF, w/ intermittent RVR. Acquired thrombophilia   --switched from warfarin to Eliquis to due pt's previous non-compliance with INR checks --cont metop --cont Eliquis  Acute on chronic systolic CHF:  echo shows EF 30-35%, normal diastolic function.  --Continue diuresis, switch to oral --Strict I/Os.   UTI: secondary to enterococcus faecium.  S/p zyvox  No further indication for antibiotics  Hyponatremia: resolved  Morbid obesity: BMI 69.  Complicates overall care and prognosis   Hypothyroidism --cont Synthroid  Generalized weakness:  PT/OT recs SNF, however, per TOC, currently not able to obtain [facility] services for pt nor place pt in SNF rehab.  Pt had also refused SNF rehab. --PT/OT as much as possible while inpatient --TOC to continue discuss options with pt --Patient  electing to go home.  We were able to secure home health services including PT and OT -Plan for discharge home with home health services on Monday, 04/28/2020  Right shoulder pain --cont home Soma TID, per pt  request --xray of right shoulder wnl --MRI not possible due to body habitus --Outpatient orthopedic referral  Pressure ulcer right buttock stage 3, POA --bariatric bed --Foley discontinued    DVT prophylaxis: Eliquis Code Status: Full Family Communication: None Disposition Plan: Status is: Inpatient  Remains inpatient appropriate because:Inpatient level of care appropriate due to severity of illness   Dispo: The patient is from: Home              Anticipated d/c is to: Home              Patient currently is not medically stable to d/c.   Difficult to place patient yes  Weakness improving.  Plan to monitor with intensive physical therapy over the weekend and discharge home with home health services on Monday 3/14   Level of care: Med-Surg  Consultants:   None  Procedures:   None  Antimicrobials:  None   Subjective: Patient seen and examined.  Level of mobility improving slowly.  Continue daily therapy evaluations  Objective: Vitals:   04/25/20 2055 04/26/20 0500 04/26/20 0504 04/26/20 1157  BP:   (!) 122/59 130/73  Pulse: 84  88 (!) 51  Resp:   (!) 22   Temp:   97.6 F (36.4 C) 98.5 F (36.9 C)  TempSrc:   Oral Oral  SpO2: 99%  98% 97%  Weight:  (!) 194 kg    Height:        Intake/Output Summary (Last 24 hours) at 04/26/2020 1350 Last data filed at 04/26/2020 0510 Gross per 24 hour  Intake 720 ml  Output 2600 ml  Net -1880 ml   Filed Weights   04/24/20 0620 04/25/20 0343 04/26/20 0500  Weight: (!) 196 kg (!) 194.6 kg (!) 194 kg    Examination:  General exam: No acute distress.  Appears weak and physically deconditioned Respiratory system: Poor respiratory efforts.  Lung sounds decreased at bases.  Normal work of breathing.  Room air Cardiovascular system: Distant heart sounds.  No appreciable murmurs Gastrointestinal system: Obese, nontender, nondistended, hypoactive bowel sounds  Central nervous system: Alert and oriented. No focal  neurological deficits. Extremities: Symmetric 5 x 5 power. Skin: No rashes, lesions or ulcers Psychiatry: Judgement and insight appear normal. Mood & affect appropriate.     Data Reviewed: I have personally reviewed following labs and imaging studies  CBC: Recent Labs  Lab 04/20/20 0507 04/21/20 0545 04/22/20 0709 04/23/20 0440 04/25/20 0801  WBC 9.1 9.1 8.0 7.8 7.3  NEUTROABS  --   --   --   --  5.0  HGB 11.2* 11.4* 11.3* 11.1* 11.6*  HCT 34.4* 35.6* 34.0* 33.8* 35.8*  MCV 98.6 99.2 97.1 96.6 99.2  PLT 186 198 183 185 173   Basic Metabolic Panel: Recent Labs  Lab 04/20/20 0507 04/21/20 0545 04/22/20 0709 04/23/20 0440 04/24/20 0507 04/25/20 0425 04/25/20 0801  NA 137 138 137 137  --   --  138  K 4.4 4.0 3.8 3.9  --   --  3.1*  CL 91* 89* 91* 92*  --   --  89*  CO2 40* 40* 38* 39*  --   --  39*  GLUCOSE 129* 154* 141* 143*  --   --  128*  BUN 36*  34* 33* 29*  --   --  30*  CREATININE 0.68 0.76 0.78 0.75  --   --  0.74  CALCIUM 8.9 8.9 8.8* 8.7*  --   --  8.6*  MG 2.1 2.0 2.1 2.0 2.0 1.7  --    GFR: Estimated Creatinine Clearance: 166.8 mL/min (by C-G formula based on SCr of 0.74 mg/dL). Liver Function Tests: No results for input(s): AST, ALT, ALKPHOS, BILITOT, PROT, ALBUMIN in the last 168 hours. No results for input(s): LIPASE, AMYLASE in the last 168 hours. No results for input(s): AMMONIA in the last 168 hours. Coagulation Profile: Recent Labs  Lab 04/20/20 0507 04/21/20 0545  INR 1.9* 1.4*   Cardiac Enzymes: No results for input(s): CKTOTAL, CKMB, CKMBINDEX, TROPONINI in the last 168 hours. BNP (last 3 results) No results for input(s): PROBNP in the last 8760 hours. HbA1C: No results for input(s): HGBA1C in the last 72 hours. CBG: Recent Labs  Lab 04/24/20 1053 04/24/20 1649 04/25/20 0727 04/25/20 1148 04/26/20 0809  GLUCAP 152* 193* 159* 113* 124*   Lipid Profile: No results for input(s): CHOL, HDL, LDLCALC, TRIG, CHOLHDL, LDLDIRECT in  the last 72 hours. Thyroid Function Tests: No results for input(s): TSH, T4TOTAL, FREET4, T3FREE, THYROIDAB in the last 72 hours. Anemia Panel: No results for input(s): VITAMINB12, FOLATE, FERRITIN, TIBC, IRON, RETICCTPCT in the last 72 hours. Sepsis Labs: No results for input(s): PROCALCITON, LATICACIDVEN in the last 168 hours.  No results found for this or any previous visit (from the past 240 hour(s)).       Radiology Studies: No results found.      Scheduled Meds: . apixaban  5 mg Oral BID  . aspirin  81 mg Oral Daily  . atorvastatin  40 mg Oral Daily  . carisoprodol  350 mg Oral TID  . Chlorhexidine Gluconate Cloth  6 each Topical Daily  . DULoxetine  20 mg Oral Daily  . guaiFENesin  1,200 mg Oral BID  . hydrocerin   Topical Daily  . insulin aspart  0-20 Units Subcutaneous BID WC  . Ipratropium-Albuterol  1 puff Inhalation Q6H  . levothyroxine  75 mcg Oral Q0600  . mouth rinse  15 mL Mouth Rinse BID  . metoprolol succinate  100 mg Oral Daily  . montelukast  10 mg Oral QHS  . multivitamin with minerals  1 tablet Oral Daily  . Ensure Max Protein  11 oz Oral BID  . torsemide  80 mg Oral BID  . traZODone  150 mg Oral QHS   Continuous Infusions: . sodium chloride 250 mL (04/13/20 2057)     LOS: 20 days    Time spent: 15 minutes    Tresa Moore, MD [facility] Pager [phone]  If 7PM-7AM, please contact night-coverage 04/26/2020, 1:50 PM

## 2020-04-27 LAB — GLUCOSE, CAPILLARY
Glucose-Capillary: 115 mg/dL — ABNORMAL HIGH (ref 70–99)
Glucose-Capillary: 192 mg/dL — ABNORMAL HIGH (ref 70–99)

## 2020-04-27 NOTE — Progress Notes (Signed)
PROGRESS NOTE    [name] [name]  ZES:[id number] DOB: 03-Jul-1960 DOA: 04/06/2020 PCP: [name], DO   Brief Narrative: 60 year old male with supra morbid obesity, obesity hypoventilation syndrome and chronic hypoxic respiratory failure due to the same who presented to [facility] ED due to increased shortness of breath 4 hours prior to admission. His past medical history is as noted below. Per history he had myalgias and cough. He had been on his CPAP prior to presenting to the emergency room but found no relief from it. Patient was being evaluated in the emergency room and was placed on BiPAP. However shortly after being placed on BiPAP he became unresponsive and pulseless. He was noted to be in asystole andreceived ACLS protocols for approximately 10 minutes until ROSC was obtained. He was intubated during the procedure.  After ICU stay patient was successfully extubated.  He was found to have COVID-19 pneumonia and superimposed MRSA pneumonia.  Started on steroids, Zyvox, Lasix.  Presumptive diagnosis of cardiac arrest was respiratory arrest in the setting of COVID-19 pneumonia and possible obstructive sleep apnea, hypercapnic respiratory failure.  Patient has stabilized from a medical standpoint.  He remains significantly weak and deconditioned.  Therapy recommend skilled nursing facility but patient is adamantly refusing at this time.  Too weak to be DC'd home.  Revisited the issue of skilled nursing facility with patient again.  Unfortunately due to his insurance status he would need to commit to 30 days at rehab and signed over his check.  He is unwilling to do so.  I lengthy conversation about his progression and we were able to establish home health PT and OT services.  3/11: Patient is improving slowly but will benefit from continued inpatient PT services over the weekend.  Had a lengthy discussion with the patient and the physical therapist.  All in agreement with plan of care.   We will plan to monitor patient over the weekend, daily PT, discharge home with home health services tentatively Monday 3/14    Assessment & Plan:   Active Problems:   Acute respiratory failure (HCC)   Pressure ulcer   Respiratory arrest (HCC)  Acute on chronic hypoxic & hypercapnic respiratory failure:  back to baseline oxygen level. Secondary to COVID19 pneumonia, superimposed MRSA pneumonia & pulmonary vascular congestion.  CTA chest was neg for PE. Patient is 25 L net negative since admission Foley discontinued, patient able to void spontaneously Plan: Continue diuresis per home dose  Generalized weakness:  PT/OT recs SNF, however, per TOC, currently not able to obtain [facility] services for pt nor place pt in SNF rehab.  Pt had also refused SNF rehab. --PT/OT as much as possible while inpatient --TOC to continue discuss options with pt --Patient electing to go home.  We were able to secure home health services including PT and OT -Plan for discharge home with home health services on Monday, 04/28/2020 -Would benefit from continued PT evaluation while admitted   Cardiac arrest: w/ asystole.  Suspect respiratory arrest.  ROSC obtained in 10 mins.   DM2: likely new onset.  HbA1c 6.6, well controlled.  --SSI  Transaminitis: etiology unclear, labile   Elevated troponins: likely secondary to demand ischemia   A. fib: likely PAF, w/ intermittent RVR. Acquired thrombophilia   --switched from warfarin to Eliquis to due pt's previous non-compliance with INR checks --cont metop --cont Eliquis  Acute on chronic systolic CHF:  echo shows EF 30-35%, normal diastolic function.  --Continue diuresis, switch to oral --Strict I/Os.  UTI: secondary to enterococcus faecium.  S/p zyvox  No further indication for antibiotics  Hyponatremia: resolved  Morbid obesity: BMI 69.  Complicates overall care and prognosis   Hypothyroidism --cont Synthroid    Right shoulder  pain --cont home Soma TID, per pt request --xray of right shoulder wnl --MRI not possible due to body habitus --Outpatient orthopedic referral  Pressure ulcer right buttock stage 3, POA --bariatric bed --Foley discontinued    DVT prophylaxis: Eliquis Code Status: Full Family Communication: None Disposition Plan: Status is: Inpatient  Remains inpatient appropriate because:Inpatient level of care appropriate due to severity of illness   Dispo: The patient is from: Home              Anticipated d/c is to: Home              Patient currently is not medically stable to d/c.   Difficult to place patient yes  Weakness slowly improving.  Plan on discharge home with home health services Monday 3/14   Level of care: Med-Surg  Consultants:   None  Procedures:   None  Antimicrobials:  None   Subjective: Patient seen and examined.  Level of mobility improving slowly.  Continue daily therapy evaluations  Objective: Vitals:   04/26/20 2353 04/27/20 0000 04/27/20 0758 04/27/20 0800  BP: (!) 92/43 (!) 98/58 (!) 102/54 (!) 112/56  Pulse: (!) 48 98 78 74  Resp: 20 20 17 17   Temp: (!) 97.5 F (36.4 C) (!) 97.5 F (36.4 C)  98.1 F (36.7 C)  TempSrc:    Oral  SpO2: 99% 99%    Weight:      Height:        Intake/Output Summary (Last 24 hours) at 04/27/2020 1043 Last data filed at 04/27/2020 0145 Gross per 24 hour  Intake --  Output 250 ml  Net -250 ml   Filed Weights   04/24/20 0620 04/25/20 0343 04/26/20 0500  Weight: (!) 196 kg (!) 194.6 kg (!) 194 kg    Examination:  General exam: No acute distress.  Appears weak and physically deconditioned Respiratory system: Poor respiratory efforts.  Lung sounds decreased at bases.  Normal work of breathing.  Room air Cardiovascular system: Distant heart sounds.  No appreciable murmurs Gastrointestinal system: Obese, nontender, nondistended, hypoactive bowel sounds  Central nervous system: Alert and oriented. No focal  neurological deficits. Extremities: Symmetric 5 x 5 power. Skin: No rashes, lesions or ulcers Psychiatry: Judgement and insight appear normal. Mood & affect appropriate.     Data Reviewed: I have personally reviewed following labs and imaging studies  CBC: Recent Labs  Lab 04/21/20 0545 04/22/20 0709 04/23/20 0440 04/25/20 0801  WBC 9.1 8.0 7.8 7.3  NEUTROABS  --   --   --  5.0  HGB 11.4* 11.3* 11.1* 11.6*  HCT 35.6* 34.0* 33.8* 35.8*  MCV 99.2 97.1 96.6 99.2  PLT 198 183 185 173   Basic Metabolic Panel: Recent Labs  Lab 04/21/20 0545 04/22/20 0709 04/23/20 0440 04/24/20 0507 04/25/20 0425 04/25/20 0801  NA 138 137 137  --   --  138  K 4.0 3.8 3.9  --   --  3.1*  CL 89* 91* 92*  --   --  89*  CO2 40* 38* 39*  --   --  39*  GLUCOSE 154* 141* 143*  --   --  128*  BUN 34* 33* 29*  --   --  30*  CREATININE 0.76 0.78 0.75  --   --  0.74  CALCIUM 8.9 8.8* 8.7*  --   --  8.6*  MG 2.0 2.1 2.0 2.0 1.7  --    GFR: Estimated Creatinine Clearance: 166.8 mL/min (by C-G formula based on SCr of 0.74 mg/dL). Liver Function Tests: No results for input(s): AST, ALT, ALKPHOS, BILITOT, PROT, ALBUMIN in the last 168 hours. No results for input(s): LIPASE, AMYLASE in the last 168 hours. No results for input(s): AMMONIA in the last 168 hours. Coagulation Profile: Recent Labs  Lab 04/21/20 0545  INR 1.4*   Cardiac Enzymes: No results for input(s): CKTOTAL, CKMB, CKMBINDEX, TROPONINI in the last 168 hours. BNP (last 3 results) No results for input(s): PROBNP in the last 8760 hours. HbA1C: No results for input(s): HGBA1C in the last 72 hours. CBG: Recent Labs  Lab 04/25/20 0727 04/25/20 1148 04/26/20 0809 04/26/20 1647 04/27/20 0816  GLUCAP 159* 113* 124* 170* 115*   Lipid Profile: No results for input(s): CHOL, HDL, LDLCALC, TRIG, CHOLHDL, LDLDIRECT in the last 72 hours. Thyroid Function Tests: No results for input(s): TSH, T4TOTAL, FREET4, T3FREE, THYROIDAB in the  last 72 hours. Anemia Panel: No results for input(s): VITAMINB12, FOLATE, FERRITIN, TIBC, IRON, RETICCTPCT in the last 72 hours. Sepsis Labs: No results for input(s): PROCALCITON, LATICACIDVEN in the last 168 hours.  No results found for this or any previous visit (from the past 240 hour(s)).       Radiology Studies: No results found.      Scheduled Meds: . apixaban  5 mg Oral BID  . aspirin  81 mg Oral Daily  . atorvastatin  40 mg Oral Daily  . carisoprodol  350 mg Oral TID  . Chlorhexidine Gluconate Cloth  6 each Topical Daily  . DULoxetine  20 mg Oral Daily  . guaiFENesin  1,200 mg Oral BID  . hydrocerin   Topical Daily  . insulin aspart  0-20 Units Subcutaneous BID WC  . Ipratropium-Albuterol  1 puff Inhalation Q6H  . levothyroxine  75 mcg Oral Q0600  . mouth rinse  15 mL Mouth Rinse BID  . metoprolol succinate  100 mg Oral Daily  . montelukast  10 mg Oral QHS  . multivitamin with minerals  1 tablet Oral Daily  . Ensure Max Protein  11 oz Oral BID  . torsemide  80 mg Oral BID  . traZODone  150 mg Oral QHS   Continuous Infusions: . sodium chloride 250 mL (04/13/20 2057)     LOS: 21 days    Time spent: 15 minutes    Tresa Moore, MD [facility] Pager [phone]  If 7PM-7AM, please contact night-coverage 04/27/2020, 10:43 AM

## 2020-04-28 LAB — GLUCOSE, CAPILLARY: Glucose-Capillary: 123 mg/dL — ABNORMAL HIGH (ref 70–99)

## 2020-04-28 MED ORDER — APIXABAN 5 MG PO TABS: 5.0000 mg | ORAL_TABLET | Freq: Two times a day (BID) | ORAL | 0 refills | Status: DC

## 2020-04-28 MED ORDER — METHOCARBAMOL 500 MG PO TABS: 500.0000 mg | ORAL_TABLET | Freq: Three times a day (TID) | ORAL | 0 refills | Status: DC | PRN

## 2020-04-28 MED ORDER — OXYCODONE-ACETAMINOPHEN 7.5-325 MG PO TABS: 1.0000 | ORAL_TABLET | Freq: Four times a day (QID) | ORAL | 0 refills | Status: DC | PRN

## 2020-04-28 MED ORDER — ALBUTEROL SULFATE HFA 108 (90 BASE) MCG/ACT IN AERS: 2.0000 | INHALATION_SPRAY | RESPIRATORY_TRACT | 2 refills | Status: DC | PRN

## 2020-04-28 NOTE — TOC Transition Note (Signed)
Transition of Care [facility]) - CM/SW Discharge Note   Patient Details  Name: [name] [name] MRN: [id number] Date of Birth: 02/02/1961  Transition of Care [facility]) CM/SW Contact:  Maree Krabbe, LCSW Phone Number: 04/28/2020, 9:58 AM   Clinical Narrative:   Pt will dc home with home health. Frances Furbish will service. DME will be delivered to the home. Transport has been arranged through [employer].     Final next level of care: Home w Home Health Services Barriers to Discharge: No Barriers Identified   Patient Goals and CMS Choice        Discharge Placement                Patient to be transferred to facility by: first choice   Patient and family notified of of transfer: 04/28/20  Discharge Plan and Services   Discharge Planning Services: CM Consult              DME Agency: [employer]       HH Arranged: PT,OT [facility] Agency: [facility] Date [facility] Agency Contacted: 04/23/20   Representative spoke with at [facility] Agency: Kandee Keen  Social Determinants of Health (SDOH) Interventions     Readmission Risk Interventions Readmission Risk Prevention Plan 01/11/2020 12/24/2019 01/25/2019  Transportation Screening Complete Complete Complete  PCP or Specialist Appt within 3-5 Days - - -  HRI or Home Care Consult - - -  Social Work Consult for Recovery Care Planning/Counseling - - -  Palliative Care Screening - - -  Medication Review [employer]) Complete Complete Complete  PCP or Specialist appointment within 3-5 days of discharge Complete Complete Complete  HRI or Home Care Consult - - Complete  SW Recovery Care/Counseling Consult Complete Complete Complete  Palliative Care Screening Complete Not Applicable Not Applicable  Skilled Nursing Facility Not Applicable Not Applicable Not Applicable

## 2020-04-28 NOTE — Discharge Summary (Signed)
Physician Discharge Summary  [name] [name] ZOX:[id number] DOB: [birth year] DOA: 04/06/2020  PCP: [name], DO  Admit date: 04/06/2020 Discharge date: 04/28/2020  Admitted From: Home  Disposition:  Home with home health  Recommendations for Outpatient Follow-up:  1. Follow up with PCP in 1-2 weeks 2. Follow-up with cardiology as directed  Home Health: Yes Equipment/Devices: Yes oxygen 2 L Discharge Condition: Stable CODE STATUS: Full Diet recommendation: Heart healthy/carb modified Brief/Interim Summary:  60 year old male with supra morbid obesity, obesity hypoventilation syndrome and chronic hypoxic respiratory failure due to the same who presented to [facility] ED due to increased shortness of breath 4 hours prior to admission. His past medical history is as noted below. Per history he had myalgias and cough. He had been on his CPAP prior to presenting to the emergency room but found no relief from it. Patient was being evaluated in the emergency room and was placed on BiPAP. However shortly after being placed on BiPAP he became unresponsive and pulseless. He was noted to be in asystole andreceived ACLS protocols for approximately 10 minutes until ROSC was obtained. He was intubated during the procedure.  After ICU stay patient was successfully extubated.  He was found to have COVID-19 pneumonia and superimposed MRSA pneumonia.  Started on steroids, Zyvox, Lasix.  Presumptive diagnosis of cardiac arrest was respiratory arrest in the setting of COVID-19 pneumonia and possible obstructive sleep apnea, hypercapnic respiratory failure.  Patient has stabilized from a medical standpoint.  He remains significantly weak and deconditioned.  Therapy recommend skilled nursing facility but patient is adamantly refusing at this time.  Too weak to be DC'd home.  Revisited the issue of skilled nursing facility with patient again.  Unfortunately due to his insurance status he would need to  commit to 30 days at rehab and signed over his check.  He is unwilling to do so.  I lengthy conversation about his progression and we were able to establish home health PT and OT services.  3/11: Patient is improving slowly but will benefit from continued inpatient PT services over the weekend.  Had a lengthy discussion with the patient and the physical therapist.  All in agreement with plan of care.  We will plan to monitor patient over the weekend, daily PT, discharge home with home health services tentatively Monday 3/14  3/14: After continued therapy and support over the weekend patient was discharged home with home health services on Monday 3/14.  Home health PT and OT were ordered at time of discharge.  Follow-up with cardiology and PCP.  Considering the patient's extensive medical comorbidities and severity of hospital presentation he will be high risk for readmission.   Discharge Diagnoses:  Active Problems:   Acute respiratory failure (HCC)   Pressure ulcer   Respiratory arrest (HCC)  Acute on chronic hypoxic &hypercapnic respiratory failure:  back to baseline oxygen level. Secondary to COVID19 pneumonia, superimposed MRSA pneumonia &pulmonary vascular congestion. CTA chest was neg for PE. Patient is 25 L net negative since admission Foley discontinued, patient able to void spontaneously Plan: Continue diuresis per home dose  Generalized weakness:  PT/OT recs SNF, however, per TOC, currently not able to obtain [facility] services for pt nor place pt in SNF rehab. Pt had also refused SNF rehab. --PT/OT as much as possible while inpatient --TOC to continue discuss options with pt --Patient electing to go home.  We were able to secure home health services including PT and OT -Plan for discharge home with home health services  on Monday, 04/28/2020 -Would benefit from continued PT evaluation while admitted -On discharge home therapy services were confirmed   Cardiac arrest: w/  asystole.  Suspect respiratory arrest. ROSC obtained in 10 mins.   DM2: likely new onset.  HbA1c 6.6, well controlled. --SSI  Transaminitis: etiology unclear, labile   Elevated troponins: likely secondary to demand ischemia   A. fib: likely PAF, w/ intermittent RVR. Acquired thrombophilia  --switched from warfarin to Eliquis to due pt's previous non-compliance with INR checks --cont metop --cont Eliquis  Acute on chronic systolic CHF:  echo shows EF 30-35%, normal diastolic function. --Continue diuresis, switch to oral --Strict I/Os.   UTI: secondary to enterococcus faecium.  S/p zyvox  No further indication for antibiotics  Hyponatremia: resolved  Morbid obesity: BMI 69.  Complicates overall care and prognosis   Hypothyroidism --cont Synthroid    Right shoulder pain --Home Soma discontinued as patient could not acquire.  MRI in house was not possible due to body habitus.  Patient should see his primary care physician and request referral to orthopedics as further evaluation of right shoulder pain is warranted. --Outpatient orthopedic referral  Pressure ulcer right buttock stage 3, POA --bariatric bed --Foley discontinued  Discharge Instructions  Discharge Instructions    Diet - low sodium heart healthy   Complete by: As directed    Increase activity slowly   Complete by: As directed    No wound care   Complete by: As directed      Allergies as of 04/28/2020   No Known Allergies     Medication List    STOP taking these medications   amiodarone 200 MG tablet Commonly known as: PACERONE   carisoprodol 350 MG tablet Commonly known as: SOMA   thiamine 100 MG tablet   warfarin 5 MG tablet Commonly known as: COUMADIN     TAKE these medications   albuterol 108 (90 Base) MCG/ACT inhaler Commonly known as: VENTOLIN HFA Inhale 2 puffs into the lungs every 4 (four) hours as needed for wheezing or shortness of breath.   apixaban 5 MG  Tabs tablet Commonly known as: ELIQUIS Take 1 tablet (5 mg total) by mouth 2 (two) times daily.   aspirin 81 MG chewable tablet Chew 1 tablet (81 mg total) by mouth daily.   atorvastatin 40 MG tablet Commonly known as: LIPITOR TAKE 1 TABLET BY MOUTH ONCE DAILY   DULoxetine 20 MG capsule Commonly known as: CYMBALTA Take 1 capsule (20 mg total) by mouth daily.   gabapentin 300 MG capsule Commonly known as: NEURONTIN Take 300 mg by mouth 3 (three) times daily.   levothyroxine 75 MCG tablet Commonly known as: SYNTHROID Take 1 tablet (75 mcg total) by mouth daily before breakfast.   lisinopril 2.5 MG tablet Commonly known as: ZESTRIL Take by mouth.   loperamide 2 MG capsule Commonly known as: IMODIUM Take 1 capsule (2 mg total) by mouth as needed for diarrhea or loose stools.   magnesium oxide 400 (241.3 Mg) MG tablet Commonly known as: MAG-OX Take 1 tablet (400 mg total) by mouth 2 (two) times daily.   meclizine 25 MG tablet Commonly known as: ANTIVERT Take 0.5-1 tablets (12.5-25 mg total) by mouth 3 (three) times daily as needed for dizziness.   methocarbamol 500 MG tablet Commonly known as: Robaxin Take 1 tablet (500 mg total) by mouth every 8 (eight) hours as needed for up to 7 days for muscle spasms.   metoprolol succinate 50 MG 24 hr tablet Commonly  known as: TOPROL-XL Take 2 tablets (100 mg total) by mouth daily. Take with or immediately following a meal.   montelukast 10 MG tablet Commonly known as: SINGULAIR Take 1 tablet (10 mg total) by mouth at bedtime.   multivitamin with minerals Tabs tablet Take 1 tablet by mouth daily.   naphazoline-glycerin 0.012-0.2 % Soln Commonly known as: CLEAR EYES REDNESS Place 1-2 drops into both eyes 4 (four) times daily as needed for eye irritation.   nitroGLYCERIN 0.4 MG SL tablet Commonly known as: NITROSTAT Place 1 tablet under tongue every 5 minutes as needed for chest pain. (No more than 3 doses within 15  minutes)   oxyCODONE-acetaminophen 7.5-325 MG tablet Commonly known as: PERCOCET Take 1 tablet by mouth every 6 (six) hours as needed for moderate pain.   potassium chloride 20 MEQ packet Commonly known as: KLOR-CON Take 40 mEq by mouth daily.   SUMAtriptan 50 MG tablet Commonly known as: IMITREX Take 1 tablet (50 mg total) by mouth every 2 (two) hours as needed for migraine or headache. May repeat in 2 hours if headache persists or recurs. No more than 2 doses in a day   torsemide 20 MG tablet Commonly known as: DEMADEX Take 4 tablets (80 mg total) by mouth 2 (two) times daily.   traZODone 150 MG tablet Commonly known as: DESYREL Take 1 tablet (150 mg total) by mouth at bedtime.            Durable Medical Equipment  (From admission, onward)         Start     Ordered   04/24/20 0935  For home use only DME Bedside commode  Once       Comments: Bariatric bedside commode  Question:  Patient needs a bedside commode to treat with the following condition  Answer:  Weakness   04/24/20 0935          Follow-up Information    [facility]. Go on 05/05/2020.   Specialty: Cardiology Why: at 1:30pm.  Contact information: [address] [place] [address] [phone]       [name], DO. Go on 04/30/2020.   Specialty: Family Medicine Why: 1:40pm appointment Contact information: [address] [place] [address] [phone]              No Known Allergies  Consultations: PCCM  Procedures/Studies: DG Chest 1 View  Result Date: 04/06/2020 CLINICAL DATA:  Status post advancement of endotracheal tube. EXAM: CHEST  1 VIEW COMPARISON:  Single-view of the chest earlier today. FINDINGS: Endotracheal tube tip is in good position at the level the clavicular heads. OG tube side port is in the distal esophagus. Recommend advancement of the tube by approximately 11 cm. There is  cardiomegaly and vascular congestion. Mild left basilar atelectasis. IMPRESSION: ETT in good position. OG tube side port is in the esophagus. Recommend advancement of approximately 11 cm. Cardiomegaly and vascular congestion. Electronically Signed   By: Drusilla Kanner M.D.   On: 04/06/2020 14:55   DG Chest 1 View  Result Date: 04/06/2020 CLINICAL DATA:  Status post intubation. EXAM: CHEST  1 VIEW COMPARISON:  01/27/2020 FINDINGS: 1232 hours. Low volume film. Endotracheal tube not definitely visualized although the tip may project over the T1 vertebral body. The cardio pericardial silhouette is enlarged. There is pulmonary vascular congestion probable interstitial edema no substantial pleural effusion. Telemetry leads overlie the chest. IMPRESSION: 1. Endotracheal tube not definitely  visualized although the tip may project over the T1 vertebral body. 2. Pulmonary vascular congestion and probable interstitial edema. Electronically Signed   By: Kennith Center M.D.   On: 04/06/2020 13:23   DG Shoulder Right  Result Date: 04/20/2020 CLINICAL DATA:  Acute RIGHT shoulder pain. EXAM: RIGHT SHOULDER - 2+ VIEW COMPARISON:  None. FINDINGS: No evidence of acute fracture, subluxation or dislocation. No focal bony lesions are identified. Mild degenerative changes at the [facility] joint noted. IMPRESSION: No acute abnormality. Electronically Signed   By: Harmon Pier M.D.   On: 04/20/2020 13:19   DG Abd 1 View  Result Date: 04/10/2020 CLINICAL DATA:  Orogastric tube placement. EXAM: ABDOMEN - 1 VIEW COMPARISON:  04/08/2020 FINDINGS: The enteric tube has been retracted in the interim with tip now projecting in the region of the proximal gastric body. The side hole remains below the GE junction. No dilated loops of bowel are seen in the included upper portion of the abdomen. The lung bases were more fully evaluated on today's chest radiograph. IMPRESSION: Interval retraction of enteric tube into the proximal stomach.  Electronically Signed   By: Sebastian Ache M.D.   On: 04/10/2020 11:03   DG Abd 1 View  Result Date: 04/08/2020 CLINICAL DATA:  ICU patient EXAM: ABDOMEN - 1 VIEW on 4 cassettes COMPARISON:  Same day chest radiograph and abdominal radiograph April 06, 2020 FINDINGS: Orogastric tube with tip and side port projecting over the gastric body. The bowel gas pattern is normal. Similar pulmonary infiltrates/edema. Stable small left pleural effusion. IMPRESSION: Orogastric tube with tip and side port projecting over the stomach. No evidence of bowel obstruction. Electronically Signed   By: Maudry Mayhew MD   On: 04/08/2020 11:17   DG Abd 1 View  Result Date: 04/06/2020 CLINICAL DATA:  OG tube placement EXAM: ABDOMEN - 1 VIEW COMPARISON:  None. FINDINGS: The OG tube projects over the gastric body. The tube is likely kinked at the side port. The visualized bowel gas pattern is unremarkable. IMPRESSION: The OG tube projects over the gastric body. The tube is likely kinked at the side port. Electronically Signed   By: Katherine Mantle M.D.   On: 04/06/2020 20:48   CT Angio Chest PE W and/or Wo Contrast  Result Date: 04/06/2020 CLINICAL DATA:  60 year old male with positive COVID-19. Concern for pulmonary embolism. EXAM: CT ANGIOGRAPHY CHEST WITH CONTRAST TECHNIQUE: Multidetector CT imaging of the chest was performed using the standard protocol during bolus administration of intravenous contrast. Multiplanar CT image reconstructions and MIPs were obtained to evaluate the vascular anatomy. CONTRAST:  OMNIPAQUE IOHEXOL 350 MG/ML SOLN COMPARISON:  Chest CT dated 01/27/2020 FINDINGS: Evaluation is very limited due to body habitus and streak artifact caused by patient's arms. Cardiovascular: There is mild cardiomegaly. Retrograde flow of contrast from the right atrium into the IVC suggestive of a degree of right heart dysfunction. Mild atherosclerotic calcification of the thoracic aorta. Evaluation of the  pulmonary arteries is very limited due to respiratory motion artifact and streak artifact caused by patient's arms. No obvious large central pulmonary artery embolus identified. Mediastinum/Nodes: No definite hilar or mediastinal adenopathy. An enteric tube within the esophagus extending into the body of the stomach. No mediastinal fluid collection. Lungs/Pleura: Probable trace bilateral pleural effusions. Bilateral lower lobe subsegmental and streaky densities may represent atelectasis or scarring. Infiltrate is not excluded. Linear left upper lobe probable atelectasis or scarring. No pneumothorax. The central airways remain patent. Upper Abdomen: Fatty liver. Musculoskeletal: Degenerative changes  of the spine. No acute osseous pathology. Review of the MIP images confirms the above findings. IMPRESSION: 1. No obvious large central pulmonary artery embolus identified. 2. Probable trace bilateral pleural effusions. Bilateral lower lobe subsegmental and streaky densities may represent atelectasis or scarring. Infiltrate is not excluded. 3. Aortic Atherosclerosis (ICD10-I70.0). Electronically Signed   By: Elgie Collard M.D.   On: 04/06/2020 15:58   DG Chest Port 1 View  Result Date: 04/13/2020 CLINICAL DATA:  60 year old male with history of shortness of breath. EXAM: PORTABLE CHEST 1 VIEW COMPARISON:  Chest x-ray 04/12/2020. FINDINGS: There is a right-sided internal jugular central venous catheter with tip terminating in the mid superior vena cava. Patient has been extubated. Nasogastric tube has been removed. Lung volumes are low. Persistent bibasilar opacities (left greater than right), which may reflect areas of atelectasis and/or consolidation. Small left pleural effusion. No definite right pleural effusion. Pulmonary venous congestion, without frank pulmonary edema. Heart size is mildly enlarged. Upper mediastinal contours are within normal limits. IMPRESSION: 1. Support apparatus, as above. 2.  Cardiomegaly with pulmonary venous congestion. 3. Improving aeration in the lung bases with persistent atelectasis and/or consolidation bilaterally (left greater than right), and small left pleural effusion. Electronically Signed   By: Trudie Reed M.D.   On: 04/13/2020 07:14   DG Chest Port 1 View  Result Date: 04/12/2020 CLINICAL DATA:  Respiratory failure EXAM: PORTABLE CHEST 1 VIEW COMPARISON:  04/11/2020 FINDINGS: Endotracheal tube is been placed 7.0 cm above the carina. Nasogastric tube can be followed as far as the expected distal esophagus where evaluation is subsequently limited by underpenetration. Esophageal Doppler probe is again noted. Right internal jugular central venous catheter tip noted within the superior vena cava. Bibasilar opacification persists possibly related to overlying soft tissue, however, small posteriorly layering bilateral pleural effusions are difficult to exclude. Mild cardiomegaly is unchanged. Mild central pulmonary vascular congestion without overt pulmonary edema is unchanged. No pneumothorax. IMPRESSION: Slightly limited examination. Nasogastric tube tip is indeterminate. Otherwise stable support lines and tubes. Stable cardiomegaly and central pulmonary vascular congestion. Possible small bilateral pleural effusions. Electronically Signed   By: Helyn Numbers MD   On: 04/12/2020 06:35   DG Chest Port 1 View  Result Date: 04/11/2020 CLINICAL DATA:  Hypoxia EXAM: PORTABLE CHEST 1 VIEW COMPARISON:  April 10, 2020 FINDINGS: Endotracheal tube tip is 3.9 cm above the carina. Nasogastric tube tip and side port are below the diaphragm. Central catheter tip is in the superior vena cava near the cavoatrial junction. No pneumothorax. Cardiomegaly with is again noted with a degree of pulmonary venous hypertension. There are small pleural effusions bilaterally. No appreciable airspace consolidation. No adenopathy. No bone lesions. IMPRESSION: Tube and catheter positions as  described without pneumothorax. Stable cardiomegaly with a degree of pulmonary vascular congestion. Small pleural effusions bilaterally. No consolidation. Electronically Signed   By: Bretta Bang III M.D.   On: 04/11/2020 07:59   DG Chest Port 1 View  Result Date: 04/10/2020 CLINICAL DATA:  Respiratory failure.  ET tube advanced. EXAM: PORTABLE CHEST 1 VIEW COMPARISON:  Chest x-ray 04/08/2020. FINDINGS: Endotracheal tube tip 4 cm above the carina. NG tube tip below left hemidiaphragm. Right IJ line stable position. Severe cardiomegaly again noted. Diffuse bilateral pulmonary infiltrates/edema, right side greater than left again noted. Small left pleural effusion again noted. No pneumothorax. IMPRESSION: 1. Endotracheal tube tip 4 cm above the carina. NG tube tip below left hemidiaphragm. Right IJ line stable position. 2. Severe cardiomegaly again noted. Diffuse  bilateral pulmonary infiltrates/edema, right side greater than left again noted. Small left pleural effusion again noted. Similar findings noted on prior exam. Electronically Signed   By: Maisie Fus  Register   On: 04/10/2020 07:21   DG Chest Port 1 View  Result Date: 04/08/2020 CLINICAL DATA:  Respiratory failure. EXAM: PORTABLE CHEST 1 VIEW COMPARISON:  Prior study 04/06/2020. FINDINGS: Endotracheal tube, NG tube, right IJ line in stable position. Stable cardio may bilateral pulmonary infiltrates/edema again noted. Similar findings on prior exam. Small left pleural effusion. No pneumothorax. IMPRESSION: 1. Lines and tubes in stable position. 2. Bilateral pulmonary infiltrates/edema again noted. Similar findings noted on prior exam. Small left pleural effusion again. Electronically Signed   By: Maisie Fus  Register   On: 04/08/2020 05:13   DG Chest Port 1 View  Result Date: 04/06/2020 CLINICAL DATA:  Central line placement EXAM: PORTABLE CHEST 1 VIEW COMPARISON:  Earlier same day. FINDINGS: Endotracheal tube tip 5 cm above the carina. Orogastric or  nasogastric tube enters the abdomen. Right internal jugular central line tip in the SVC 3 cm above the right atrium. No pneumothorax. Cardiomegaly persists. Pulmonary venous hypertension with mild interstitial edema, possibly slightly worsened. IMPRESSION: Right internal jugular central line tip in the SVC 3 cm above the right atrium. No pneumothorax. Pulmonary venous hypertension with mild interstitial edema, possibly slightly worsened. Electronically Signed   By: Paulina Fusi M.D.   On: 04/06/2020 23:56   ECHOCARDIOGRAM COMPLETE  Result Date: 04/07/2020    ECHOCARDIOGRAM REPORT   Patient Name:   [name] [name] Date of Exam: 04/07/2020 Medical Rec #:  [id number]       Height:       69.0 in Accession #:    [id number]      Weight:       431.0 lb Date of Birth:  12-Nov-1960       BSA:          2.863 m Patient Age:    59 years        BP:           98/58 mmHg Patient Gender: M               HR:           68 bpm. Exam Location:  [facility] Procedure: 2D Echo, Color Doppler and Cardiac Doppler Indications:     R06.03 Acute Respiratory Distress  History:         Patient has prior history of Echocardiogram examinations, most                  recent 12/23/2019. NICM, HFpEF, COPD; Risk Factors:Sleep Apnea                  and Hypertension.  Sonographer:     Humphrey Rolls RDCS (AE) Referring Phys:  [id number] Salena Saner Diagnosing Phys: Harold Hedge MD  Sonographer Comments: Technically difficult study due to poor echo windows and echo performed with patient supine and on artificial respirator. Image acquisition challenging due to patient body habitus and Image acquisition challenging due to COPD. IMPRESSIONS  1. Left ventricular ejection fraction, by estimation, is 30 to 35%. Left ventricular ejection fraction by PLAX is 21 %. The left ventricle has moderate to severely decreased function. Left ventricular endocardial border not optimally defined to evaluate  regional wall motion. The left ventricular internal cavity size was mildly  dilated. Left ventricular diastolic parameters were normal.  2. Right ventricular systolic function is normal. The right  ventricular size is mildly enlarged.  3. Left atrial size was mildly dilated.  4. Right atrial size was mildly dilated.  5. The mitral valve was not well visualized. No evidence of mitral valve regurgitation.  6. The aortic valve was not well visualized. Aortic valve regurgitation is not visualized. FINDINGS  Left Ventricle: Left ventricular ejection fraction, by estimation, is 30 to 35%. Left ventricular ejection fraction by PLAX is 21 %. The left ventricle has moderate to severely decreased function. Left ventricular endocardial border not optimally defined to evaluate regional wall motion. The left ventricular internal cavity size was mildly dilated. There is no left ventricular hypertrophy. Left ventricular diastolic parameters were normal. Right Ventricle: The right ventricular size is mildly enlarged. No increase in right ventricular wall thickness. Right ventricular systolic function is normal. Left Atrium: Left atrial size was mildly dilated. Right Atrium: Right atrial size was mildly dilated. Pericardium: There is no evidence of pericardial effusion. Mitral Valve: The mitral valve was not well visualized. No evidence of mitral valve regurgitation. MV peak gradient, 4.3 mmHg. The mean mitral valve gradient is 2.0 mmHg. Tricuspid Valve: The tricuspid valve is not well visualized. Tricuspid valve regurgitation is trivial. Aortic Valve: The aortic valve was not well visualized. Aortic valve regurgitation is not visualized. Aortic valve mean gradient measures 6.0 mmHg. Aortic valve peak gradient measures 11.7 mmHg. Aortic valve area, by VTI measures 2.41 cm. Pulmonic Valve: The pulmonic valve was not well visualized. Pulmonic valve regurgitation is not visualized. Aorta: The aortic root was not well visualized. IAS/Shunts: The interatrial septum was not well visualized.  LEFT VENTRICLE PLAX  2D LV EF:         Left            Diastology                ventricular     LV e' medial:    4.35 cm/s                ejection        LV E/e' medial:  20.6                fraction by     LV e' lateral:   9.79 cm/s                PLAX is 21      LV E/e' lateral: 9.1                %. LVIDd:         6.00 cm LVIDs:         5.40 cm LV PW:         1.30 cm LV IVS:        0.90 cm LVOT diam:     2.60 cm LV SV:         75 LV SV Index:   26 LVOT Area:     5.31 cm  LEFT ATRIUM           Index LA diam:      5.10 cm 1.78 cm/m LA Vol (A2C): 81.9 ml 28.60 ml/m LA Vol (A4C): 62.5 ml 21.83 ml/m  AORTIC VALVE                    PULMONIC VALVE AV Area (Vmax):    2.80 cm     PV Vmax:       1.03 m/s AV Area (Vmean):   2.63 cm  PV Vmean:      61.900 cm/s AV Area (VTI):     2.41 cm     PV VTI:        0.139 m AV Vmax:           171.00 cm/s  PV Peak grad:  4.2 mmHg AV Vmean:          111.000 cm/s PV Mean grad:  2.0 mmHg AV VTI:            0.313 m AV Peak Grad:      11.7 mmHg AV Mean Grad:      6.0 mmHg LVOT Vmax:         90.10 cm/s LVOT Vmean:        54.900 cm/s LVOT VTI:          0.142 m LVOT/AV VTI ratio: 0.45  AORTA Ao Root diam: 3.40 cm MITRAL VALVE MV Area (PHT): 3.13 cm    SHUNTS MV Area VTI:   2.85 cm    Systemic VTI:  0.14 m MV Peak grad:  4.3 mmHg    Systemic Diam: 2.60 cm MV Mean grad:  2.0 mmHg MV Vmax:       1.04 m/s MV Vmean:      59.8 cm/s MV Decel Time: 243 msec MV E velocity: 89.50 cm/s Harold Hedge MD Electronically signed by Harold Hedge MD Signature Date/Time: 04/07/2020/1:43:37 PM    Final (Updated)     (Echo, Carotid, EGD, Colonoscopy, ERCP)    Subjective: Patient seen and examined the day of discharge.  Mobility slowly improving.  Patient stable for discharge home with home health services at this time.  Discharge Exam: Vitals:   04/28/20 0528 04/28/20 0822  BP: (!) 115/49 135/79  Pulse: 85 (!) 107  Resp: 18 20  Temp: 98.4 F (36.9 C) 98.4 F (36.9 C)  SpO2: 99% 100%   Vitals:   04/27/20  1634 04/27/20 2045 04/28/20 0528 04/28/20 0822  BP: 127/72 126/70 (!) 115/49 135/79  Pulse: (!) 56 86 85 (!) 107  Resp:  20 18 20   Temp:  98.4 F (36.9 C) 98.4 F (36.9 C) 98.4 F (36.9 C)  TempSrc:  Oral Oral Oral  SpO2: 99% 99% 99% 100%  Weight:      Height:        General: Pt is alert, awake, not in acute distress Cardiovascular: RRR, S1/S2 +, no rubs, no gallops Respiratory: Decreased at bases.  Scattered fine crackles.  Normal work of breathing.  2 L Abdominal: Obese, nontender, nondistended, normal bowel sounds Extremities: Chronic nonpitting edema bilaterally.  Discoloration consistent with stasis dermatitis    The results of significant diagnostics from this hospitalization (including imaging, microbiology, ancillary and laboratory) are listed below for reference.     Microbiology: No results found for this or any previous visit (from the past 240 hour(s)).   Labs: BNP (last 3 results) Recent Labs    02/01/20 0612 04/06/20 1111 04/12/20 0350  BNP 221.2* 191.9* 940.1*   Basic Metabolic Panel: Recent Labs  Lab 04/22/20 0709 04/23/20 0440 04/24/20 0507 04/25/20 0425 04/25/20 0801  NA 137 137  --   --  138  K 3.8 3.9  --   --  3.1*  CL 91* 92*  --   --  89*  CO2 38* 39*  --   --  39*  GLUCOSE 141* 143*  --   --  128*  BUN 33* 29*  --   --  30*  CREATININE 0.78 0.75  --   --  0.74  CALCIUM 8.8* 8.7*  --   --  8.6*  MG 2.1 2.0 2.0 1.7  --    Liver Function Tests: No results for input(s): AST, ALT, ALKPHOS, BILITOT, PROT, ALBUMIN in the last 168 hours. No results for input(s): LIPASE, AMYLASE in the last 168 hours. No results for input(s): AMMONIA in the last 168 hours. CBC: Recent Labs  Lab 04/22/20 0709 04/23/20 0440 04/25/20 0801  WBC 8.0 7.8 7.3  NEUTROABS  --   --  5.0  HGB 11.3* 11.1* 11.6*  HCT 34.0* 33.8* 35.8*  MCV 97.1 96.6 99.2  PLT 183 185 173   Cardiac Enzymes: No results for input(s): CKTOTAL, CKMB, CKMBINDEX, TROPONINI in the  last 168 hours. BNP: Invalid input(s): POCBNP CBG: Recent Labs  Lab 04/26/20 0809 04/26/20 1647 04/27/20 0816 04/27/20 1218 04/28/20 0804  GLUCAP 124* 170* 115* 192* 123*   D-Dimer No results for input(s): DDIMER in the last 72 hours. Hgb A1c No results for input(s): HGBA1C in the last 72 hours. Lipid Profile No results for input(s): CHOL, HDL, LDLCALC, TRIG, CHOLHDL, LDLDIRECT in the last 72 hours. Thyroid function studies No results for input(s): TSH, T4TOTAL, T3FREE, THYROIDAB in the last 72 hours.  Invalid input(s): FREET3 Anemia work up No results for input(s): VITAMINB12, FOLATE, FERRITIN, TIBC, IRON, RETICCTPCT in the last 72 hours. Urinalysis    Component Value Date/Time   COLORURINE AMBER (A) 04/06/2020 1213   APPEARANCEUR CLOUDY (A) 04/06/2020 1213   APPEARANCEUR Hazy (A) 12/25/2018 1442   LABSPEC 1.025 04/06/2020 1213   PHURINE 5.0 04/06/2020 1213   GLUCOSEU 50 (A) 04/06/2020 1213   HGBUR LARGE (A) 04/06/2020 1213   BILIRUBINUR NEGATIVE 04/06/2020 1213   BILIRUBINUR Negative 12/25/2018 1442   KETONESUR 5 (A) 04/06/2020 1213   PROTEINUR >=300 (A) 04/06/2020 1213   NITRITE NEGATIVE 04/06/2020 1213   LEUKOCYTESUR TRACE (A) 04/06/2020 1213   Sepsis Labs Invalid input(s): PROCALCITONIN,  WBC,  LACTICIDVEN Microbiology No results found for this or any previous visit (from the past 240 hour(s)).   Time coordinating discharge: Over 30 minutes  SIGNED:   Tresa Moore, MD  [facility] 04/28/2020, 1:47 PM Pager   If 7PM-7AM, please contact night-coverage

## 2020-04-28 NOTE — Progress Notes (Signed)
Physical Therapy Treatment Patient Details Name: [name] [name] MRN: [id number] DOB: 04-04-1960 Today's Date: 04/28/2020    History of Present Illness Patient is a 60 year old male with supra morbid obesity, obesity hypoventilation syndrome and chronic hypoxic respiratory failure due to the same who presented to [facility] ED on 04/06/2020 due to increased shortness of breath 4 hours prior to admission. He had been on his CPAP prior to presenting to the emergency room but found no relief from it. Patient was being evaluated in the emergency room and was placed on BiPAP. However shortly after being placed on BiPAP he became unresponsive and pulseless.  He was noted to be in asystole and received ACLS protocols for approximately 10 minutes until ROSC was obtained. He was intubated during the procedure, and transfered to ICU intubated and mechanically ventilated.    PT Comments    Pt was pleasant and motivated to participate during the session.  Pt put forth good effort with seated therex and was steady with good eccentric and concentric control during multiple sit to/from stand transfers.  Pt was able to perform marching in place each time in standing with SpO2 98-99% and HR WNL on 3LO2/min.  Pt will benefit from PT services in a SNF setting upon discharge to safely address deficits listed in patient problem list for decreased caregiver assistance and eventual return to PLOF.     Follow Up Recommendations  SNF     Equipment Recommendations  Other (comment) (Bariatric BSC)    Recommendations for Other Services       Precautions / Restrictions Precautions Precautions: Fall Restrictions Weight Bearing Restrictions: No    Mobility  Bed Mobility               General bed mobility comments: pt in recliner pre/post session    Transfers Overall transfer level: Needs assistance Equipment used: Rolling walker (2 wheeled) Transfers: Sit to/from Stand Sit to Stand: Supervision          General transfer comment: Sit to/from stand x 3 with seated therapeutic rest breaks between standing sessions; max standing time 30-45 sec  Ambulation/Gait Ambulation/Gait assistance: Supervision           General Gait Details: Pt preferred marching in place to have chair in place for safety; steady marching with max marching time 10-15 sec   Stairs             Wheelchair Mobility    Modified Rankin (Stroke Patients Only)       Balance Overall balance assessment: Needs assistance Sitting-balance support: Feet supported Sitting balance-Leahy Scale: Good     Standing balance support: Bilateral upper extremity supported;During functional activity Standing balance-Leahy Scale: Fair Standing balance comment: Min to Mod lean on the RW while in standing                            Cognition Arousal/Alertness: Awake/alert Behavior During Therapy: WFL for tasks assessed/performed Overall Cognitive Status: Within Functional Limits for tasks assessed                                        Exercises Total Joint Exercises Long Arc Quad: Strengthening;Both;10 reps;5 reps (with manual resistance) Knee Flexion: Strengthening;Both;5 reps;10 reps (with manual resistance) Marching in Standing: AROM;Strengthening;Both;10 reps;15 reps;Standing Other Exercises: Pt education provided on physiological benefits of activity Other Exercises: Pt education on  proper use of lift chair at home to prevent loss of functional strength due to overuse of lift feature    General Comments        Pertinent Vitals/Pain Pain Assessment: 0-10 Pain Score: 9  Pain Location: R hip/shoulder Pain Descriptors / Indicators: Aching;Sore Pain Intervention(s): Premedicated before session;Monitored during session    Home Living                      Prior Function            PT Goals (current goals can now be found in the care plan section) Progress towards PT  goals: Progressing toward goals    Frequency    Min 2X/week      PT Plan Current plan remains appropriate    Co-evaluation              AM-PAC PT "6 Clicks" Mobility   Outcome Measure  Help needed turning from your back to your side while in a flat bed without using bedrails?: A Little Help needed moving from lying on your back to sitting on the side of a flat bed without using bedrails?: A Little Help needed moving to and from a bed to a chair (including a wheelchair)?: A Little Help needed standing up from a chair using your arms (e.g., wheelchair or bedside chair)?: A Little Help needed to walk in hospital room?: A Little Help needed climbing 3-5 steps with a railing? : A Lot 6 Click Score: 17    End of Session Equipment Utilized During Treatment: Oxygen Activity Tolerance: Patient tolerated treatment well Patient left: in chair;with call bell/phone within reach Nurse Communication: Mobility status PT Visit Diagnosis: Other abnormalities of gait and mobility (R26.89);Muscle weakness (generalized) (M62.81);Difficulty in walking, not elsewhere classified (R26.2);Unsteadiness on feet (R26.81)     Time: 1000-1024 PT Time Calculation (min) (ACUTE ONLY): 24 min  Charges:  $Therapeutic Exercise: 8-22 mins $Therapeutic Activity: 8-22 mins                     D. Scott Kentrail Shew PT, DPT 04/28/20, 10:40 AM

## 2020-04-28 NOTE — Plan of Care (Signed)

## 2020-04-29 ENCOUNTER — Telehealth: Payer: Self-pay

## 2020-04-29 ENCOUNTER — Telehealth: Payer: Self-pay | Admitting: Licensed Clinical Social Worker

## 2020-04-29 ENCOUNTER — Other Ambulatory Visit (HOSPITAL_COMMUNITY): Payer: Self-pay

## 2020-04-29 ENCOUNTER — Encounter: Payer: Medicaid Other | Admitting: Occupational Therapy

## 2020-04-29 ENCOUNTER — Ambulatory Visit: Payer: Self-pay | Admitting: Licensed Clinical Social Worker

## 2020-04-29 ENCOUNTER — Telehealth: Payer: Self-pay | Admitting: *Deleted

## 2020-04-29 DIAGNOSIS — F3341 Major depressive disorder, recurrent, in partial remission: Secondary | ICD-10-CM

## 2020-04-29 DIAGNOSIS — J441 Chronic obstructive pulmonary disease with (acute) exacerbation: Secondary | ICD-10-CM

## 2020-04-29 DIAGNOSIS — J9610 Chronic respiratory failure, unspecified whether with hypoxia or hypercapnia: Secondary | ICD-10-CM | POA: Diagnosis not present

## 2020-04-29 DIAGNOSIS — E039 Hypothyroidism, unspecified: Secondary | ICD-10-CM

## 2020-04-29 DIAGNOSIS — J984 Other disorders of lung: Secondary | ICD-10-CM | POA: Diagnosis not present

## 2020-04-29 DIAGNOSIS — M5442 Lumbago with sciatica, left side: Secondary | ICD-10-CM | POA: Diagnosis not present

## 2020-04-29 NOTE — Progress Notes (Signed)
Had a home visit with [name] [name]. Asked how he was doing, he said not good.  He is unable to stand.  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   He says he need about 6 inches to lift it up off the floor so he can stand.  [facility] was there.  [facility] plugged in his recliner for him to the wall.  He attempted to stand after rasing it up and was unable to.  He said call EMS for them to get him in his bed last night.  He ahs everything around his recliner.  He is cooking on a hot plate reaching to the side.  He has a urine jug he is urinating in.  He has a large bottle of liquor beside his chair.  He has an empty bottle of liquor in the trash can.  He states has a room mate but have never seen him.  He states the liquor is his room mate.  He states his room mate can not help him get up and down.  He does some light things around the house.  His aid came by and she states she is not strong enough to lift him, she states not sure what he is going to do. He states has his medications but do not see any nigh med pill packs.  He is using his old pill packs and lost a night time.  He has eliquis, advised him he can not bee taking the warfarin.  Do not see it listed on his pill packs.  He said came in a separate bottle.  [facility] is trying to set up CPAP for him. He said has not heard from or seen any one from [facility] yet.  Advised him he needs to answer all phone calls, he has missed 2 from PCP today. He said he will call and cancel his PCP appt and he also wants me to cancel his HF clinic appt.  He states there is no way can go to them.  He states he was weak before the hospital stay but he can not do nothing now.  Asked why he refused SNF, he states did not want to lose his check.  He would lose his apartment if went.  He says can not afford his bills if loses his check.  He has plenty of food and he says taking his meds.  No way to tell if he is taking them or not.  He stats urinating a lot.  He is sitting with feet down because he says  can not slide back in recliner, he says has to sit on edge to urinate.  Concerned with him sliding out of it when lifts it, he said he wont.  He keeps phone close by.  Will contact PCP of findings, will visit for heart failure, diet and medication management.   [name] EMT-Paramedic [phone]

## 2020-04-29 NOTE — Telephone Encounter (Signed)
[name] [name] with [employer] called to relay message that pt had called her and stated no one had gotten in touch with [name] [name].   He told her he did not have appt with Dr Kirtland Bouchard. (and he does 3/16) She said he also complained of weakness.

## 2020-04-29 NOTE — Telephone Encounter (Signed)
Amber with [employer] called to relay message that pt had called her and stated no one had gotten in touch with him.   He told her he did not have appt with Dr K. (and he does 3/16) She said he also complained of weakness. 

## 2020-04-29 NOTE — Telephone Encounter (Signed)
Transition Care Management Unsuccessful Follow-up Telephone Call  Date of discharge and from where:  04/28/2020 [facility]  Attempts:  1st Attempt  Reason for unsuccessful TCM follow-up call:  Unable to reach patient
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
## 2020-04-29 NOTE — Telephone Encounter (Signed)
Chronic Care Management    Clinical Social Work General Follow Up Note  04/29/2020 Name: [name] [name] MRN: [id number] DOB: [birth year]  [name] [name] is a 60 y.o. year old male who is a primary care patient of [name], DO. The CCM team was consulted for assistance with [name] .   Review of patient status, including review of consultants reports, relevant laboratory and other test results, and collaboration with appropriate care team members and the patient's provider was performed as part of comprehensive patient evaluation and provision of chronic care management services.    LCSW completed second CCM outreach attempt today but was unable to reach patient successfully. A HIPPA compliant voice message was left encouraging patient to return call once available. LCSW will ask Scheduling Care Guide to reschedule CCM SW appointment with patient as well.   Outpatient Encounter Medications as of 04/29/2020  Medication Sig Note  . albuterol (VENTOLIN HFA) 108 (90 Base) MCG/ACT inhaler Inhale 2 puffs into the lungs every 4 (four) hours as needed for wheezing or shortness of breath.   [name] apixaban (ELIQUIS) 5 MG TABS tablet Take 1 tablet (5 mg total) by mouth 2 (two) times daily.   [name] aspirin 81 MG chewable tablet Chew 1 tablet (81 mg total) by mouth daily.   [name] atorvastatin (LIPITOR) 40 MG tablet TAKE 1 TABLET BY MOUTH ONCE DAILY (Patient taking differently: Take 40 mg by mouth daily.)   . DULoxetine (CYMBALTA) 20 MG capsule Take 1 capsule (20 mg total) by mouth daily. 04/06/2020: LF 03-17-20 30days  . gabapentin (NEURONTIN) 300 MG capsule Take 300 mg by mouth 3 (three) times daily.   [name] levothyroxine (SYNTHROID) 75 MCG tablet Take 1 tablet (75 mcg total) by mouth daily before breakfast. 04/06/2020: LF 01-14-20 30days  . lisinopril (ZESTRIL) 2.5 MG tablet Take by mouth.   . loperamide (IMODIUM) 2 MG capsule Take 1 capsule (2 mg total) by mouth as needed for diarrhea or loose  stools.   . magnesium oxide (MAG-OX) 400 (241.3 Mg) MG tablet Take 1 tablet (400 mg total) by mouth 2 (two) times daily.   . meclizine (ANTIVERT) 25 MG tablet Take 0.5-1 tablets (12.5-25 mg total) by mouth 3 (three) times daily as needed for dizziness. 04/06/2020: LF 02-26-20  . methocarbamol (ROBAXIN) 500 MG tablet Take 1 tablet (500 mg total) by mouth every 8 (eight) hours as needed for up to 7 days for muscle spasms.   . metoprolol succinate (TOPROL-XL) 50 MG 24 hr tablet Take 2 tablets (100 mg total) by mouth daily. Take with or immediately following a meal.   . montelukast (SINGULAIR) 10 MG tablet Take 1 tablet (10 mg total) by mouth at bedtime.   . Multiple Vitamin (MULTIVITAMIN WITH MINERALS) TABS tablet Take 1 tablet by mouth daily.   . naphazoline-glycerin (CLEAR EYES REDNESS) 0.012-0.2 % SOLN Place 1-2 drops into both eyes 4 (four) times daily as needed for eye irritation.   . nitroGLYCERIN (NITROSTAT) 0.4 MG SL tablet Place 1 tablet under tongue every 5 minutes as needed for chest pain. (No more than 3 doses within 15 minutes)   . oxyCODONE-acetaminophen (PERCOCET) 7.5-325 MG tablet Take 1 tablet by mouth every 6 (six) hours as needed for moderate pain.   . potassium chloride (KLOR-CON) 20 MEQ packet Take 40 mEq by mouth daily. (Patient not taking: Reported on 04/06/2020)   . SUMAtriptan (IMITREX) 50 MG tablet Take 1 tablet (50 mg total) by mouth every 2 (two) hours as needed for  migraine or headache. May repeat in 2 hours if headache persists or recurs. No more than 2 doses in a day   . torsemide (DEMADEX) 20 MG tablet Take 4 tablets (80 mg total) by mouth 2 (two) times daily. 04/06/2020: LF 02-02-20  . traZODone (DESYREL) 150 MG tablet Take 1 tablet (150 mg total) by mouth at bedtime. 04/06/2020: LF 03-17-20 30days   No facility-administered encounter medications on file as of 04/29/2020.    Follow Up Plan: Scheduling Care Guide will reach out to patient to reschedule appointment.   [name], BSW, MSW, LCSW [facility]  [facility] [email or web address] Phone: [phone]

## 2020-04-30 ENCOUNTER — Other Ambulatory Visit: Payer: Self-pay

## 2020-04-30 ENCOUNTER — Observation Stay
Admission: EM | Admit: 2020-04-30 | Discharge: 2020-05-02 | Disposition: A | Discharge status: Home Health | Payer: Medicaid Other | Attending: Family Medicine | Admitting: Family Medicine

## 2020-04-30 ENCOUNTER — Emergency Department: Payer: Medicaid Other

## 2020-04-30 ENCOUNTER — Inpatient Hospital Stay: Payer: Medicaid Other | Admitting: Family Medicine

## 2020-04-30 ENCOUNTER — Encounter: Payer: Self-pay | Admitting: Internal Medicine

## 2020-04-30 DIAGNOSIS — Z86718 Personal history of other venous thrombosis and embolism: Secondary | ICD-10-CM | POA: Diagnosis not present

## 2020-04-30 DIAGNOSIS — E876 Hypokalemia: Secondary | ICD-10-CM | POA: Diagnosis present

## 2020-04-30 DIAGNOSIS — Z7901 Long term (current) use of anticoagulants: Secondary | ICD-10-CM | POA: Diagnosis not present

## 2020-04-30 DIAGNOSIS — I252 Old myocardial infarction: Secondary | ICD-10-CM | POA: Insufficient documentation

## 2020-04-30 DIAGNOSIS — I5022 Chronic systolic (congestive) heart failure: Secondary | ICD-10-CM | POA: Insufficient documentation

## 2020-04-30 DIAGNOSIS — Z79899 Other long term (current) drug therapy: Secondary | ICD-10-CM | POA: Diagnosis not present

## 2020-04-30 DIAGNOSIS — Z6841 Body Mass Index (BMI) 40.0 and over, adult: Secondary | ICD-10-CM | POA: Diagnosis not present

## 2020-04-30 DIAGNOSIS — I482 Chronic atrial fibrillation, unspecified: Secondary | ICD-10-CM | POA: Diagnosis present

## 2020-04-30 DIAGNOSIS — N390 Urinary tract infection, site not specified: Secondary | ICD-10-CM | POA: Diagnosis present

## 2020-04-30 DIAGNOSIS — U071 COVID-19: Principal | ICD-10-CM | POA: Insufficient documentation

## 2020-04-30 DIAGNOSIS — R5381 Other malaise: Secondary | ICD-10-CM | POA: Diagnosis present

## 2020-04-30 DIAGNOSIS — J449 Chronic obstructive pulmonary disease, unspecified: Secondary | ICD-10-CM | POA: Insufficient documentation

## 2020-04-30 DIAGNOSIS — E039 Hypothyroidism, unspecified: Secondary | ICD-10-CM | POA: Diagnosis not present

## 2020-04-30 DIAGNOSIS — I4819 Other persistent atrial fibrillation: Secondary | ICD-10-CM | POA: Diagnosis not present

## 2020-04-30 DIAGNOSIS — N3000 Acute cystitis without hematuria: Secondary | ICD-10-CM | POA: Insufficient documentation

## 2020-04-30 DIAGNOSIS — J45909 Unspecified asthma, uncomplicated: Secondary | ICD-10-CM | POA: Insufficient documentation

## 2020-04-30 DIAGNOSIS — E662 Morbid (severe) obesity with alveolar hypoventilation: Secondary | ICD-10-CM | POA: Diagnosis not present

## 2020-04-30 DIAGNOSIS — I872 Venous insufficiency (chronic) (peripheral): Secondary | ICD-10-CM | POA: Diagnosis present

## 2020-04-30 DIAGNOSIS — I517 Cardiomegaly: Secondary | ICD-10-CM | POA: Diagnosis not present

## 2020-04-30 DIAGNOSIS — Z136 Encounter for screening for cardiovascular disorders: Secondary | ICD-10-CM | POA: Diagnosis not present

## 2020-04-30 DIAGNOSIS — Z86711 Personal history of pulmonary embolism: Secondary | ICD-10-CM | POA: Insufficient documentation

## 2020-04-30 DIAGNOSIS — I2699 Other pulmonary embolism without acute cor pulmonale: Secondary | ICD-10-CM | POA: Diagnosis present

## 2020-04-30 DIAGNOSIS — J9610 Chronic respiratory failure, unspecified whether with hypoxia or hypercapnia: Secondary | ICD-10-CM | POA: Diagnosis not present

## 2020-04-30 DIAGNOSIS — Z7982 Long term (current) use of aspirin: Secondary | ICD-10-CM | POA: Insufficient documentation

## 2020-04-30 DIAGNOSIS — I110 Hypertensive heart disease with heart failure: Secondary | ICD-10-CM | POA: Insufficient documentation

## 2020-04-30 DIAGNOSIS — R531 Weakness: Secondary | ICD-10-CM

## 2020-04-30 DIAGNOSIS — I10 Essential (primary) hypertension: Secondary | ICD-10-CM | POA: Diagnosis not present

## 2020-04-30 DIAGNOSIS — J9811 Atelectasis: Secondary | ICD-10-CM | POA: Diagnosis not present

## 2020-04-30 DIAGNOSIS — E1165 Type 2 diabetes mellitus with hyperglycemia: Secondary | ICD-10-CM | POA: Diagnosis not present

## 2020-04-30 LAB — URINALYSIS, COMPLETE (UACMP) WITH MICROSCOPIC
Bilirubin Urine: NEGATIVE
Glucose, UA: NEGATIVE mg/dL
Ketones, ur: NEGATIVE mg/dL
Nitrite: NEGATIVE
Protein, ur: 100 mg/dL — AB
RBC / HPF: >50 RBC/hpf — ABNORMAL HIGH (ref 0–5)
Specific Gravity, Urine: 1.02 (ref 1.005–1.030)
WBC, UA: >50 WBC/hpf — ABNORMAL HIGH (ref 0–5)
pH: 5 (ref 5.0–8.0)

## 2020-04-30 LAB — CBC WITH DIFFERENTIAL/PLATELET
Abs Immature Granulocytes: 0.03 10*3/uL (ref 0.00–0.07)
Basophils Absolute: 0 10*3/uL (ref 0.0–0.1)
Basophils Relative: 0 %
Eosinophils Absolute: 0.2 10*3/uL (ref 0.0–0.5)
Eosinophils Relative: 3 %
HCT: 31.4 % — ABNORMAL LOW (ref 39.0–52.0)
Hemoglobin: 10.3 g/dL — ABNORMAL LOW (ref 13.0–17.0)
Immature Granulocytes: 0 %
Lymphocytes Relative: 6 %
Lymphs Abs: 0.5 10*3/uL — ABNORMAL LOW (ref 0.7–4.0)
MCH: 31.7 pg (ref 26.0–34.0)
MCHC: 32.8 g/dL (ref 30.0–36.0)
MCV: 96.6 fL (ref 80.0–100.0)
Monocytes Absolute: 0.7 10*3/uL (ref 0.1–1.0)
Monocytes Relative: 8 %
Neutro Abs: 7 10*3/uL (ref 1.7–7.7)
Neutrophils Relative %: 83 %
Platelets: 151 10*3/uL (ref 150–400)
RBC: 3.25 MIL/uL — ABNORMAL LOW (ref 4.22–5.81)
RDW: 16 % — ABNORMAL HIGH (ref 11.5–15.5)
WBC: 8.5 10*3/uL (ref 4.0–10.5)
nRBC: 0 % (ref 0.0–0.2)

## 2020-04-30 LAB — LACTIC ACID, PLASMA: Lactic Acid, Venous: 1.5 mmol/L (ref 0.5–1.9)

## 2020-04-30 LAB — GLUCOSE, CAPILLARY
Glucose-Capillary: 137 mg/dL — ABNORMAL HIGH (ref 70–99)
Glucose-Capillary: 222 mg/dL — ABNORMAL HIGH (ref 70–99)

## 2020-04-30 LAB — TROPONIN I (HIGH SENSITIVITY): Troponin I (High Sensitivity): 32 ng/L — ABNORMAL HIGH (ref <18)

## 2020-04-30 LAB — MAGNESIUM: Magnesium: 1.7 mg/dL (ref 1.7–2.4)

## 2020-04-30 LAB — CK: Total CK: 37 U/L — ABNORMAL LOW (ref 49–397)

## 2020-04-30 LAB — COMPREHENSIVE METABOLIC PANEL
ALT: 41 U/L (ref 0–44)
AST: 25 U/L (ref 15–41)
Albumin: 3.1 g/dL — ABNORMAL LOW (ref 3.5–5.0)
Alkaline Phosphatase: 64 U/L (ref 38–126)
Anion gap: 12 (ref 5–15)
BUN: 26 mg/dL — ABNORMAL HIGH (ref 6–20)
CO2: 35 mmol/L — ABNORMAL HIGH (ref 22–32)
Calcium: 8.7 mg/dL — ABNORMAL LOW (ref 8.9–10.3)
Chloride: 88 mmol/L — ABNORMAL LOW (ref 98–111)
Creatinine, Ser: 0.89 mg/dL (ref 0.61–1.24)
GFR, Estimated: >60 mL/min (ref >60)
Glucose, Bld: 145 mg/dL — ABNORMAL HIGH (ref 70–99)
Potassium: 3.2 mmol/L — ABNORMAL LOW (ref 3.5–5.1)
Sodium: 135 mmol/L (ref 135–145)
Total Bilirubin: 0.9 mg/dL (ref 0.3–1.2)
Total Protein: 6.3 g/dL — ABNORMAL LOW (ref 6.5–8.1)

## 2020-04-30 LAB — ETHANOL: Alcohol, Ethyl (B): <10 mg/dL (ref <10)

## 2020-04-30 MED ORDER — ALBUTEROL SULFATE HFA 108 (90 BASE) MCG/ACT IN AERS
2.0000 | INHALATION_SPRAY | RESPIRATORY_TRACT | Status: DC | PRN
  Filled 2020-04-30: qty 6.7

## 2020-04-30 MED ORDER — ACETAMINOPHEN 325 MG PO TABS: 650.0000 mg | ORAL_TABLET | Freq: Four times a day (QID) | ORAL | Status: DC | PRN

## 2020-04-30 MED ORDER — OXYCODONE-ACETAMINOPHEN 7.5-325 MG PO TABS
1.0000 | ORAL_TABLET | ORAL | Status: DC | PRN
  Administered 2020-04-30 – 2020-05-02 (×8): 1 via ORAL
  Filled 2020-04-30 (×8): qty 1

## 2020-04-30 MED ORDER — MONTELUKAST SODIUM 10 MG PO TABS
10.0000 mg | ORAL_TABLET | Freq: Every day | ORAL | Status: DC
  Administered 2020-04-30 – 2020-05-01 (×2): 10 mg via ORAL
  Filled 2020-04-30 (×3): qty 1

## 2020-04-30 MED ORDER — TORSEMIDE 20 MG PO TABS
80.0000 mg | ORAL_TABLET | Freq: Two times a day (BID) | ORAL | Status: DC
  Administered 2020-05-01 (×2): 80 mg via ORAL
  Filled 2020-04-30 (×4): qty 4

## 2020-04-30 MED ORDER — SODIUM CHLORIDE 0.9 % IV SOLN
1.0000 g | INTRAVENOUS | Status: DC
  Filled 2020-04-30: qty 10

## 2020-04-30 MED ORDER — ONDANSETRON HCL 4 MG/2ML IJ SOLN: 4.0000 mg | Freq: Four times a day (QID) | INTRAMUSCULAR | Status: DC | PRN

## 2020-04-30 MED ORDER — SODIUM CHLORIDE 0.9 % IV SOLN
1.0000 g | Freq: Once | INTRAVENOUS | Status: AC
  Administered 2020-04-30: 1 g via INTRAVENOUS
  Filled 2020-04-30: qty 10

## 2020-04-30 MED ORDER — ATORVASTATIN CALCIUM 20 MG PO TABS
40.0000 mg | ORAL_TABLET | Freq: Every day | ORAL | Status: DC
Start: 2020-04-30 — End: 2020-05-02
  Administered 2020-04-30 – 2020-05-02 (×3): 40 mg via ORAL
  Filled 2020-04-30 (×3): qty 2

## 2020-04-30 MED ORDER — GABAPENTIN 300 MG PO CAPS
300.0000 mg | ORAL_CAPSULE | Freq: Three times a day (TID) | ORAL | Status: DC
  Administered 2020-05-01: 10:00:00 300 mg via ORAL
  Filled 2020-04-30 (×2): qty 1

## 2020-04-30 MED ORDER — LEVOTHYROXINE SODIUM 50 MCG PO TABS
75.0000 ug | ORAL_TABLET | Freq: Every day | ORAL | Status: DC
  Administered 2020-05-01 – 2020-05-02 (×2): 75 ug via ORAL
  Filled 2020-04-30 (×2): qty 1

## 2020-04-30 MED ORDER — MECLIZINE HCL 25 MG PO TABS
12.5000 mg | ORAL_TABLET | Freq: Three times a day (TID) | ORAL | Status: DC | PRN
Start: 2020-04-30 — End: 2020-05-02
  Filled 2020-04-30: qty 1

## 2020-04-30 MED ORDER — SUMATRIPTAN SUCCINATE 50 MG PO TABS
50.0000 mg | ORAL_TABLET | ORAL | Status: DC | PRN
  Filled 2020-04-30: qty 1

## 2020-04-30 MED ORDER — METOPROLOL SUCCINATE ER 50 MG PO TB24
100.0000 mg | ORAL_TABLET | Freq: Every day | ORAL | Status: DC
  Administered 2020-05-01: 10:00:00 100 mg via ORAL
  Filled 2020-04-30 (×2): qty 2

## 2020-04-30 MED ORDER — ACETAMINOPHEN 650 MG RE SUPP: 650.0000 mg | Freq: Four times a day (QID) | RECTAL | Status: DC | PRN

## 2020-04-30 MED ORDER — SODIUM CHLORIDE 0.9 % IV SOLN: 250.0000 mL | INTRAVENOUS | Status: DC | PRN

## 2020-04-30 MED ORDER — ADULT MULTIVITAMIN W/MINERALS CH
1.0000 | ORAL_TABLET | Freq: Every day | ORAL | Status: DC
  Administered 2020-04-30 – 2020-05-02 (×3): 1 via ORAL
  Filled 2020-04-30 (×3): qty 1

## 2020-04-30 MED ORDER — NITROGLYCERIN 0.4 MG SL SUBL: 0.4000 mg | SUBLINGUAL_TABLET | SUBLINGUAL | Status: DC | PRN

## 2020-04-30 MED ORDER — POTASSIUM CHLORIDE 20 MEQ PO PACK
40.0000 meq | PACK | Freq: Every day | ORAL | Status: DC
  Administered 2020-04-30: 40 meq via ORAL
  Filled 2020-04-30 (×2): qty 2

## 2020-04-30 MED ORDER — TRAZODONE HCL 50 MG PO TABS
150.0000 mg | ORAL_TABLET | Freq: Every day | ORAL | Status: DC
  Administered 2020-04-30 – 2020-05-01 (×2): 150 mg via ORAL
  Filled 2020-04-30 (×2): qty 3

## 2020-04-30 MED ORDER — OXYCODONE-ACETAMINOPHEN 7.5-325 MG PO TABS: 1.0000 | ORAL_TABLET | Freq: Four times a day (QID) | ORAL | Status: DC | PRN

## 2020-04-30 MED ORDER — LISINOPRIL 5 MG PO TABS
2.5000 mg | ORAL_TABLET | Freq: Every day | ORAL | Status: DC
  Administered 2020-05-01: 2.5 mg via ORAL
  Filled 2020-04-30 (×2): qty 1

## 2020-04-30 MED ORDER — METHOCARBAMOL 500 MG PO TABS
500.0000 mg | ORAL_TABLET | Freq: Three times a day (TID) | ORAL | Status: DC | PRN
  Administered 2020-04-30: 500 mg via ORAL
  Filled 2020-04-30 (×4): qty 1

## 2020-04-30 MED ORDER — INSULIN ASPART 100 UNIT/ML ~~LOC~~ SOLN
0.0000 [IU] | Freq: Three times a day (TID) | SUBCUTANEOUS | Status: DC
  Administered 2020-05-01: 7 [IU] via SUBCUTANEOUS
  Administered 2020-05-01: 13:00:00 4 [IU] via SUBCUTANEOUS
  Administered 2020-05-01: 7 [IU] via SUBCUTANEOUS
  Filled 2020-04-30 (×3): qty 1

## 2020-04-30 MED ORDER — ASPIRIN 81 MG PO CHEW
81.0000 mg | CHEWABLE_TABLET | Freq: Every day | ORAL | Status: DC
  Administered 2020-04-30 – 2020-05-02 (×3): 81 mg via ORAL
  Filled 2020-04-30 (×3): qty 1

## 2020-04-30 MED ORDER — DULOXETINE HCL 20 MG PO CPEP
20.0000 mg | ORAL_CAPSULE | Freq: Every day | ORAL | Status: DC
  Administered 2020-04-30 – 2020-05-02 (×3): 20 mg via ORAL
  Filled 2020-04-30 (×3): qty 1

## 2020-04-30 MED ORDER — APIXABAN 5 MG PO TABS
5.0000 mg | ORAL_TABLET | Freq: Two times a day (BID) | ORAL | Status: DC
  Administered 2020-04-30 – 2020-05-02 (×5): 5 mg via ORAL
  Filled 2020-04-30 (×5): qty 1

## 2020-04-30 MED ORDER — SODIUM CHLORIDE 0.9% FLUSH
3.0000 mL | Freq: Two times a day (BID) | INTRAVENOUS | Status: DC
  Administered 2020-04-30 – 2020-05-01 (×4): 3 mL via INTRAVENOUS

## 2020-04-30 MED ORDER — NAPHAZOLINE-GLYCERIN 0.012-0.2 % OP SOLN
1.0000 [drp] | Freq: Four times a day (QID) | OPHTHALMIC | Status: DC | PRN
  Filled 2020-04-30: qty 15

## 2020-04-30 MED ORDER — MAGNESIUM OXIDE 400 (241.3 MG) MG PO TABS
400.0000 mg | ORAL_TABLET | Freq: Two times a day (BID) | ORAL | Status: DC
  Administered 2020-04-30 – 2020-05-02 (×5): 400 mg via ORAL
  Filled 2020-04-30 (×5): qty 1

## 2020-04-30 MED ORDER — SODIUM CHLORIDE 0.9% FLUSH
3.0000 mL | INTRAVENOUS | Status: DC | PRN
Start: 2020-04-30 — End: 2020-05-02

## 2020-04-30 MED ORDER — ONDANSETRON HCL 4 MG PO TABS: 4.0000 mg | ORAL_TABLET | Freq: Four times a day (QID) | ORAL | Status: DC | PRN

## 2020-04-30 NOTE — ED Notes (Signed)
Lab at bedside getting labs and first set of blood cultures.

## 2020-04-30 NOTE — Telephone Encounter (Signed)
Pt currently in ED.

## 2020-04-30 NOTE — Progress Notes (Signed)
Patient arrives to unit with dressings on BLE which appear to be rolled gauze and ace wraps are bunched up halfway down patient's lower legs.  Bandages left intact.  Messaged MD and requested either an order for wound consult or an order to remove dressings.  Wound consult ordered and dressings left as is pending wound consult.  Skin assessment performed except skin under dressings on BLE. Will continue to monitor.

## 2020-04-30 NOTE — ED Notes (Signed)
Pt repositioned himself in bed. Pt asleep. Urine collected from suction tubing on promofit.
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
## 2020-04-30 NOTE — ED Notes (Signed)
Called lab to have them come draw ordered labs. IV was placed with 2 attempts but it does not draw back. Spoke with Boneta Lucks in lab, they will come draw labs around 0800 or shortly after. Xray at bedside.
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
## 2020-04-30 NOTE — ED Triage Notes (Signed)
Pt BIBA via [facility] c/o of weakness. Pt has called EMS earlier in the night where fire assisted him to bed. Pt c/o of extreme weakness since he was d/c from the hospital 2 days ago. Pt is unable to walk or stand at this time. Pt has hx of Covid. Pt does use home oxygen of 2lp, EMS reports that O2 sats did not drop below 93% on RA.   EMS vitals:  CBG 265, BP 160/81, HR 73  Pt states he has not taken morning meds today.

## 2020-04-30 NOTE — ED Notes (Signed)
Pt placed on bedpan

## 2020-04-30 NOTE — ED Notes (Signed)
Cbg 101. Glucometer did not send cbg over to chart.

## 2020-04-30 NOTE — ED Notes (Signed)
Lab at bedside to draw ordered labs. Pt repositioned on side with 3 more pillows.

## 2020-04-30 NOTE — ED Notes (Signed)
ED provider is not ordering CPAP for pt to have applied to sleep.  ED provider is aware that lab is coming to draw ordered labs after 0800.

## 2020-04-30 NOTE — ED Notes (Signed)
Pt asking this nurse to "call respiratory" to order CPAP machine so he can go to sleep because he didn't sleep last night. Explained will need to discuss with provider.  Promofit applied. Pt stating need to void and wants to reposition on side after this. Pt complaining that BP cuff not comfortable.

## 2020-04-30 NOTE — ED Notes (Signed)
Second phlebotomist at bedside. Informed that 2 sets of blood cultures have been ordered.  Pt repositioned again.

## 2020-04-30 NOTE — ED Notes (Signed)
Lab will re-time second troponin.

## 2020-04-30 NOTE — ED Notes (Signed)
Lab tried to draw blood 2 times. Not successful yet. Pt requesting pain meds. EDP informed.

## 2020-04-30 NOTE — H&P (Addendum)
History and Physical    [name] MajesticKenneth [name] ZOX:[id number] DOB: [birth year] DOA: 04/30/2020  PCP: Smitty CordsKaramalegos, Alexander J, DO   Patient coming from: Home  I have personally briefly reviewed patient's old medical records in [facility]  Chief Complaint: Weakness  HPI: [name] MajesticKenneth [name] is a 60 y.o. male with medical history significant for super morbid obesity, chronic respiratory failure, hypothyroidism, chronic systolic CHF with last known LVEF of 30% to 35%, chronic respiratory failure on 2 L of oxygen, hypertension, history of atrial fibrillation, history of venous thromboembolic disease, obstructive sleep apnea who was recently discharged from the hospital after a prolonged stay for Covid pneumonia with acute respiratory failure/respiratory arrest requiring mechanical ventilation.  Patient with significant physical deconditioning and was offered subacute rehab in a skilled nursing facility which he declined and so was discharged home since he was stable from a medical standpoint. Patient was brought to the ER 2 days post discharge for evaluation of weakness.  He states that since his discharge from the hospital on 04/28/20 he has been unable to walk or stand and has been chair bound.  He called EMS because he was unable to get out of the chair to his bed so he was brought back to the hospital for evaluation. He denies having any chest pain, no shortness of breath, no nausea, no vomiting, no chest pain, no dizziness, no lightheadedness, no diarrhea, no constipation, no fever, no chills, no cough, no headache, no blurred vision, no urinary frequency, no nocturia, no dysuria. Labs show sodium 135, potassium 3.2, chloride 88, bicarb 25, glucose 145, BUN 26, creatinine 0.89, calcium 8.7, magnesium 1.7, alkaline phosphatase 64, albumin 3.1, AST 25, ALT 41, total protein 6.3, troponin 32, lactic acid 1.5, white count 8.5, hemoglobin 10.3, hematocrit 31.4, MCV 96.6, RDW 16.7, platelet count 151 Urinalysis  shows significant pyuria Chest x-ray reviewed by me shows cardiomegaly and borderline vascular congestion. Twelve-lead EKG reviewed by me shows a left bundle branch block with atrial fibrillation   ED Course: Patient is a 60 year old Caucasian male who was recently discharged from the hospital following a protracted stay for COVID-19 pneumonia with acute respiratory failure status post respiratory arrest and mechanical ventilation.  Patient is severely deconditioned and was offered subacute rehab upon discharge during his last hospitalization but he declined.  Patient has been chair bound for 2 days since his discharge and is unsafe to be home by himself.  He will be referred to observation status for further evaluation.   Review of Systems: As per HPI otherwise all other systems reviewed and negative.    Past Medical History:  Diagnosis Date  . Allergy   . Anxiety   . Arthritis   . Asthma   . Brain damage   . Chronic pain of both knees 07/13/2018  . Clotting disorder (HCC)   . COPD (chronic obstructive pulmonary disease) (HCC)   . Depression   . GERD (gastroesophageal reflux disease)   . HFrEF (heart failure with reduced ejection fraction) (HCC)    a. 03/2018 Echo: EF 25-30%, diff HK. Mod LAE.  [name]. History of DVT (deep vein thrombosis)   . History of pulmonary embolism    a. Chronic coumadin.  [name]. Hypertension   . MI (myocardial infarction) (HCC)   . Morbid obesity (HCC)   . Neck pain 07/13/2018  . NICM (nonischemic cardiomyopathy) (HCC)    a. s/p Cath x 3 - reportedly nl cors. Last cath 2019 in [place]; b. a. 03/2018 Echo: EF 25-30%, diff HK.  .[name]  Persistent atrial fibrillation (HCC)    a. 03/2018 s/p DCCV; b. CHA2DS2VASc = 1-->Xarelto (later changed to warfarin); c. 05/2018 recurrent afib-->Amio initiated.  . Sleep apnea   . Sleep apnea     Past Surgical History:  Procedure Laterality Date  . CARDIOVERSION N/A 03/24/2018   Procedure: CARDIOVERSION;  Surgeon: Antonieta Iba, MD;   Location: [facility] ORS;  Service: Cardiovascular;  Laterality: N/A;  . CARDIOVERSION N/A 08/08/2018   Procedure: CARDIOVERSION;  Surgeon: [name], MD;  Location: [facility] ORS;  Service: Cardiovascular;  Laterality: N/A;  . hearnia repair     X 3- total of two surgeries  . HERNIA REPAIR    . LEG SURGERY       reports that he has never smoked. He has never used smokeless tobacco. He reports current alcohol use. He reports that he does not use drugs.  No Known Allergies  Family History  Problem Relation Age of Onset  . Heart failure Mother   . Lung cancer Mother   . Lung cancer Father   . Heart attack Maternal Grandmother   . Heart attack Maternal Grandfather       Prior to Admission medications   Medication Sig Start Date End Date Taking? Authorizing Provider  albuterol (VENTOLIN HFA) 108 (90 Base) MCG/ACT inhaler Inhale 2 puffs into the lungs every 4 (four) hours as needed for wheezing or shortness of breath. 04/28/20   Sreenath, Jonelle Sports, MD  apixaban (ELIQUIS) 5 MG TABS tablet Take 1 tablet (5 mg total) by mouth 2 (two) times daily. 04/28/20   Tresa Moore, MD  aspirin 81 MG chewable tablet Chew 1 tablet (81 mg total) by mouth daily. 02/03/20   Esaw Grandchild A, DO  atorvastatin (LIPITOR) 40 MG tablet TAKE 1 TABLET BY MOUTH ONCE DAILY Patient taking differently: Take 40 mg by mouth daily. 08/15/19   Flinchum, [name], FNP  DULoxetine (CYMBALTA) 20 MG capsule Take 1 capsule (20 mg total) by mouth daily. 10/25/19   Karamalegos, Netta Neat, DO  gabapentin (NEURONTIN) 300 MG capsule Take 300 mg by mouth 3 (three) times daily. Only taking morning dose per patient 02/15/20   [provider]  levothyroxine (SYNTHROID) 75 MCG tablet Take 1 tablet (75 mcg total) by mouth daily before breakfast. 01/13/20   Enedina Finner, MD  lisinopril (ZESTRIL) 2.5 MG tablet Take by mouth. 03/06/19   [provider]  loperamide (IMODIUM) 2 MG capsule Take 1 capsule (2 mg total) by  mouth as needed for diarrhea or loose stools. 06/26/19   Arnetha Courser, MD  magnesium oxide (MAG-OX) 400 (241.3 Mg) MG tablet Take 1 tablet (400 mg total) by mouth 2 (two) times daily. 01/12/20   Enedina Finner, MD  meclizine (ANTIVERT) 25 MG tablet Take 0.5-1 tablets (12.5-25 mg total) by mouth 3 (three) times daily as needed for dizziness. 02/26/20   Karamalegos, Netta Neat, DO  methocarbamol (ROBAXIN) 500 MG tablet Take 1 tablet (500 mg total) by mouth every 8 (eight) hours as needed for up to 7 days for muscle spasms. 04/28/20 05/05/20  Tresa Moore, MD  metoprolol succinate (TOPROL-XL) 50 MG 24 hr tablet Take 2 tablets (100 mg total) by mouth daily. Take with or immediately following a meal. 01/12/20   Enedina Finner, MD  montelukast (SINGULAIR) 10 MG tablet Take 1 tablet (10 mg total) by mouth at bedtime. 10/12/19   Karamalegos, Netta Neat, DO  Multiple Vitamin (MULTIVITAMIN WITH MINERALS) TABS tablet Take 1 tablet by mouth daily.  [provider]  naphazoline-glycerin (CLEAR EYES REDNESS) 0.012-0.2 % SOLN Place 1-2 drops into both eyes 4 (four) times daily as needed for eye irritation. 08/14/19   Alford Highland, MD  nitroGLYCERIN (NITROSTAT) 0.4 MG SL tablet Place 1 tablet under tongue every 5 minutes as needed for chest pain. (No more than 3 doses within 15 minutes) 03/08/19   Flinchum, [name], FNP  oxyCODONE-acetaminophen (PERCOCET) 7.5-325 MG tablet Take 1 tablet by mouth every 6 (six) hours as needed for moderate pain. 04/28/20   Tresa Moore, MD  potassium chloride (KLOR-CON) 20 MEQ packet Take 40 mEq by mouth daily. 01/12/20   Enedina Finner, MD  SUMAtriptan (IMITREX) 50 MG tablet Take 1 tablet (50 mg total) by mouth every 2 (two) hours as needed for migraine or headache. May repeat in 2 hours if headache persists or recurs. No more than 2 doses in a day 12/28/19   Lurene Shadow, MD  torsemide (DEMADEX) 20 MG tablet Take 4 tablets (80 mg total) by mouth 2 (two) times daily.  02/02/20   Pennie Banter, DO  traZODone (DESYREL) 150 MG tablet Take 1 tablet (150 mg total) by mouth at bedtime. 10/25/19   [name], DO    Physical Exam: Vitals:   04/30/20 0700 04/30/20 0806 04/30/20 0830 04/30/20 1017  BP: 94/71 110/64 (!) 115/51 (!) 103/57  Pulse: (!) 106 90 (!) 114 88  Resp: (!) 23 (!) 24 (!) 22 (!) 24  Temp:      TempSrc:      SpO2: 99% 98% 100% 99%  Weight: (!) 186 kg     Height: 5\' 8"  (1.727 m)        Vitals:   04/30/20 0700 04/30/20 0806 04/30/20 0830 04/30/20 1017  BP: 94/71 110/64 (!) 115/51 (!) 103/57  Pulse: (!) 106 90 (!) 114 88  Resp: (!) 23 (!) 24 (!) 22 (!) 24  Temp:      TempSrc:      SpO2: 99% 98% 100% 99%  Weight: (!) 186 kg     Height: 5\' 8"  (1.727 m)         Constitutional: Alert and oriented x 3 . Not in any apparent distress.  Morbidly obese HEENT:      Head: Normocephalic and atraumatic.         Eyes: PERLA, EOMI, Conjunctivae are normal. Sclera is non-icteric.       Mouth/Throat: Mucous membranes are moist.       Neck: Supple with no signs of meningismus. Cardiovascular:  Irregularly irregular, tachycardic. No murmurs, gallops, or rubs. 2+ symmetrical distal pulses are present . No JVD. 2+ LE edema Respiratory: Respiratory effort normal .Lungs sounds clear bilaterally. No wheezes, crackles, or rhonchi.  Gastrointestinal: Soft, non tender, and non distended with positive bowel sounds.  Central adiposity Genitourinary: No CVA tenderness. Musculoskeletal: Nontender with normal range of motion in all extremities. No cyanosis, or erythema of extremities. Neurologic:  Face is symmetric. Moving all extremities. No gross focal neurologic deficits.  Generalized weakness Skin: Skin is warm, dry.  No rash or ulcers.  Ecchymosis involving upper arms.  Chronic venous stasis dermatitis Psychiatric: Mood and affect are normal   Labs on Admission: I have personally reviewed following labs and imaging studies  CBC: Recent  Labs  Lab 04/25/20 0801 04/30/20 0958  WBC 7.3 8.5  NEUTROABS 5.0 7.0  HGB 11.6* 10.3*  HCT 35.8* 31.4*  MCV 99.2 96.6  PLT 173 151   Basic Metabolic Panel:  Recent Labs  Lab 04/24/20 0507 04/25/20 0425 04/25/20 0801 04/30/20 0958  NA  --   --  138 135  K  --   --  3.1* 3.2*  CL  --   --  89* 88*  CO2  --   --  39* 35*  GLUCOSE  --   --  128* 145*  BUN  --   --  30* 26*  CREATININE  --   --  0.74 0.89  CALCIUM  --   --  8.6* 8.7*  MG 2.0 1.7  --  1.7   GFR: Estimated Creatinine Clearance: 145.9 mL/min (by C-G formula based on SCr of 0.89 mg/dL). Liver Function Tests: Recent Labs  Lab 04/30/20 0958  AST 25  ALT 41  ALKPHOS 64  BILITOT 0.9  PROT 6.3*  ALBUMIN 3.1*   No results for input(s): LIPASE, AMYLASE in the last 168 hours. No results for input(s): AMMONIA in the last 168 hours. Coagulation Profile: No results for input(s): INR, PROTIME in the last 168 hours. Cardiac Enzymes: No results for input(s): CKTOTAL, CKMB, CKMBINDEX, TROPONINI in the last 168 hours. BNP (last 3 results) No results for input(s): PROBNP in the last 8760 hours. HbA1C: No results for input(s): HGBA1C in the last 72 hours. CBG: Recent Labs  Lab 04/26/20 0809 04/26/20 1647 04/27/20 0816 04/27/20 1218 04/28/20 0804  GLUCAP 124* 170* 115* 192* 123*   Lipid Profile: No results for input(s): CHOL, HDL, LDLCALC, TRIG, CHOLHDL, LDLDIRECT in the last 72 hours. Thyroid Function Tests: No results for input(s): TSH, T4TOTAL, FREET4, T3FREE, THYROIDAB in the last 72 hours. Anemia Panel: No results for input(s): VITAMINB12, FOLATE, FERRITIN, TIBC, IRON, RETICCTPCT in the last 72 hours. Urine analysis:    Component Value Date/Time   COLORURINE AMBER (A) 04/30/2020 0807   APPEARANCEUR CLOUDY (A) 04/30/2020 0807   APPEARANCEUR Hazy (A) 12/25/2018 1442   LABSPEC 1.020 04/30/2020 0807   PHURINE 5.0 04/30/2020 0807   GLUCOSEU NEGATIVE 04/30/2020 0807   HGBUR MODERATE (A) 04/30/2020 0807    BILIRUBINUR NEGATIVE 04/30/2020 0807   BILIRUBINUR Negative 12/25/2018 1442   KETONESUR NEGATIVE 04/30/2020 0807   PROTEINUR 100 (A) 04/30/2020 0807   NITRITE NEGATIVE 04/30/2020 0807   LEUKOCYTESUR LARGE (A) 04/30/2020 0807    Radiological Exams on Admission: DG Chest Portable 1 View  Result Date: 04/30/2020 CLINICAL DATA:  Weakness.  Evaluate for edema EXAM: PORTABLE CHEST 1 VIEW COMPARISON:  04/13/2020 FINDINGS: Chronic cardiomegaly. Improved vascular congestion from before. Improved atelectasis from 04/06/2020 chest CT. There is no edema, consolidation, effusion, or pneumothorax. Normal aortic and hilar contours. IMPRESSION: 1. Improved aeration when compared to February imaging. 2. Cardiomegaly and borderline vascular congestion. Electronically Signed   By: Marnee Spring M.D.   On: 04/30/2020 07:48     Assessment/Plan Principal Problem:   UTI (urinary tract infection) Active Problems:   Hypothyroidism   Chronic venous stasis dermatitis of both lower extremities   Atrial fibrillation, chronic (HCC)   Class 3 obesity with alveolar hypoventilation, serious comorbidity, and body mass index (BMI) of 60.0 to 69.9 in adult [facility])   Chronic systolic CHF (congestive heart failure) (HCC)   Hypokalemia   Chronic respiratory failure (HCC)   Physical deconditioning    Urinary tract infection Patient noted to have pyuria We will treat empirically with Rocephin 1 g IV daily until urine culture results become available    Physical deconditioning Following recent prolonged hospital stay We will request PT evaluation Place patient on fall  precautions   Chronic systolic dysfunction CHF Stable and not acutely exacerbated Last known LVEF of 30 to 35% Continue torsemide, lisinopril and metoprolol    Hypothyroidism Continue Synthroid    Hypokalemia Secondary to diuretic use Supplement potassium     Chronic respiratory failure Continue 2 L of oxygen to maintain pulse  oximetry greater than 92%    Morbid obesity (BMI 62.3) Complicates overall prognosis and care Lifestyle modification and exercise has been discussed with patient in detail     Atrial fibrillation Continue metoprolol for rate control Continue apixaban as primary prophylaxis for an acute stroke    Diabetes mellitus Maintain consistent carbohydrate diet Glycemic control with sliding scale insulin  DVT prophylaxis: Lovenox Code Status: full code Family Communication: Greater than 50% of time was spent discussing patient's condition and plan of care with him at the bedside.  All questions and concerns have been addressed.  He verbalizes understanding and agrees with the plan.  CODE STATUS was discussed and he is a full code. Disposition Plan: Skilled nursing facility Consults called: Physical therapy/case management Status:Observation    Tochukwu Agbata MD [facility]     04/30/2020, 1:17 PM

## 2020-04-30 NOTE — Evaluation (Signed)
Physical Therapy Evaluation Patient Details Name: [name] [name] MRN: [id number] DOB: 10-01-1960 Today's Date: 04/30/2020   History of Present Illness  rushi chasen is a 59yoM who comes [facility] on 04/30/20 after severe weakness at home, unable to stand despite help from his lift chair. PMH: obesity hypoventilation syndrome, CRF on CPAP at home, recent admission c cardiac arrest and 10 min ACLS, intubation, recent Rt shoulder injury. Pt recently admitted, home for <2d, mobility with our services included 30-45sec standing tolerance, falls anxiety that precluded true AMB of appreciable distances. PTA pt lives witha roommate who assists minimally, baseline AMB tolerance is ~60ft c 4WW, uses his power WC for mobility much of time.  Clinical Impression  Pt admitted with above diagnosis. Pt currently with functional limitations due to the deficits listed below (see "PT Problem List"). Pt seen in ED. Upon entry, pt side lying asleep in older OBGYN gurney, fairly narrow in size for habitus; pt easily made awake and agreeable to participate. The pt is pleasant, interactive, and able to provide info regarding prior level of function, both in tolerance and independence. Pt reports his severe weakness at DC was more limiting to his capacity to be independent than he predicted, essentially was unable to return to standing despite use of lift chair. Pt attests to continued weakness akin to his condition days prior while admitted, but denies any acute decline in strength since then. In the ED, the patient is sleep deprived and thus exhausted with minimal drive to tolerate any true exertion. He reports pain in his Rt shoulder which is postacute injury, not currently tolerating use in mobility as it has been prior to DC. Pt tolerates some basic muscle testing of arms, but is too malaised and weak to move legs for testing. Patient's performance this date reveals decreased ability, independence, and tolerance in performing  all basic mobility required for performance of activities of daily living. Pt requires additional DME, close physical assistance, and cues for safe participate in mobility. Pt will benefit from skilled PT intervention to increase independence and safety with basic mobility in preparation for discharge to the venue listed below.       Follow Up Recommendations SNF;Supervision for mobility/OOB;Supervision - Intermittent Pt has no reliable support at DC for physical assistance, supervision only.     Equipment Recommendations  None recommended by PT    Recommendations for Other Services       Precautions / Restrictions Precautions Precautions: Fall Precaution Comments: Watch Rt shoulder Restrictions Weight Bearing Restrictions: No      Mobility  Bed Mobility Overal bed mobility:  (deferred)                  Transfers                    Ambulation/Gait                Stairs            Wheelchair Mobility    Modified Rankin (Stroke Patients Only)       Balance Overall balance assessment: History of Falls;Mild deficits observed, not formally tested                                           Pertinent Vitals/Pain Pain Assessment: 0-10 Pain Location: Left knee, Right hip, Rt shoulder rotator cuff injury (RC injury) all  fairly high at present >7-9    Home Living Family/patient expects to be discharged to:: Private residence Living Arrangements: Non-relatives/Friends (room mate "AD," works from home, unable to physically assist patient) Available Help at Discharge: Personal care attendant;Available PRN/intermittently (3d/week 2-4 hours) Type of Home: House Home Access: Ramped entrance     Home Layout: One level Home Equipment: Walker - 4 wheels;Shower seat;Hand held shower head;Wheelchair - power Additional Comments: sleeps in regular bed, lift chair    Prior Function Level of Independence: Needs assistance (tolerates  room-to-room AMB only ~17ft (1 year))   Gait / Transfers Assistance Needed: Pt was using bari rollator previously for transfers and scooter for fxl mobility, but decreased tolerance with several recent hospital admissions.  ADL's / Homemaking Assistance Needed: caretaker assists with bathing, shopping, cooking  Comments: Has transport for MD appts     Hand Dominance        Extremity/Trunk Assessment   Upper Extremity Assessment Upper Extremity Assessment: Generalized weakness;RUE deficits/detail RUE Deficits / Details: unwilling to test gross pushing due to shoulder pain; grip WNL, able to pull at 5/5; LUE WNL    Lower Extremity Assessment Lower Extremity Assessment:  (Not assessed at eval, pt is prohibitive and pt is in an ED gurney that is not appropriate to body size and does not allow for baseline motor patterns for bed mobility)       Communication      Cognition Arousal/Alertness: Awake/alert Behavior During Therapy: WFL for tasks assessed/performed Overall Cognitive Status: Within Functional Limits for tasks assessed                                        General Comments      Exercises     Assessment/Plan    PT Assessment Patient needs continued PT services  PT Problem List Decreased strength;Decreased mobility;Decreased safety awareness;Decreased range of motion;Decreased knowledge of precautions;Decreased activity tolerance;Decreased balance;Decreased knowledge of use of DME;Cardiopulmonary status limiting activity;Obesity       PT Treatment Interventions DME instruction;Therapeutic exercise;Gait training;Balance training;Neuromuscular re-education;Functional mobility training;Therapeutic activities;Patient/family education    PT Goals (Current goals can be found in the Care Plan section)  Acute Rehab PT Goals Patient Stated Goal: Regain baseline level of independence with basic mobility. PT Goal Formulation: With patient Time For Goal  Achievement: 05/14/20 Potential to Achieve Goals: Good    Frequency Min 2X/week   Barriers to discharge Decreased caregiver support      Co-evaluation               AM-PAC PT "6 Clicks" Mobility  Outcome Measure Help needed turning from your back to your side while in a flat bed without using bedrails?: Total Help needed moving from lying on your back to sitting on the side of a flat bed without using bedrails?: Total Help needed moving to and from a bed to a chair (including a wheelchair)?: Total Help needed standing up from a chair using your arms (e.g., wheelchair or bedside chair)?: Total Help needed to walk in hospital room?: Total Help needed climbing 3-5 steps with a railing? : Total 6 Click Score: 6    End of Session Equipment Utilized During Treatment: Oxygen Activity Tolerance: Patient limited by pain;Patient limited by fatigue Patient left: in bed;with call bell/phone within reach Nurse Communication: Mobility status PT Visit Diagnosis: Other abnormalities of gait and mobility (R26.89);Muscle weakness (generalized) (M62.81);Difficulty in walking,  not elsewhere classified (R26.2);Unsteadiness on feet (R26.81)    Time: 1497-0263 PT Time Calculation (min) (ACUTE ONLY): 13 min   Charges:   PT Evaluation $PT Eval Moderate Complexity: 1 Mod          5:32 PM, 04/30/20 Rosamaria Lints, PT, DPT Physical Therapist - [facility]  [phone] (ASCOM)    Buccola,Allan C 04/30/2020, 5:25 PM

## 2020-05-01 ENCOUNTER — Encounter: Payer: Medicaid Other | Admitting: Occupational Therapy

## 2020-05-01 DIAGNOSIS — N3000 Acute cystitis without hematuria: Secondary | ICD-10-CM | POA: Diagnosis not present

## 2020-05-01 LAB — BASIC METABOLIC PANEL
Anion gap: 10 (ref 5–15)
BUN: 18 mg/dL (ref 6–20)
CO2: 36 mmol/L — ABNORMAL HIGH (ref 22–32)
Calcium: 8.8 mg/dL — ABNORMAL LOW (ref 8.9–10.3)
Chloride: 90 mmol/L — ABNORMAL LOW (ref 98–111)
Creatinine, Ser: 0.65 mg/dL (ref 0.61–1.24)
GFR, Estimated: >60 mL/min (ref >60)
Glucose, Bld: 206 mg/dL — ABNORMAL HIGH (ref 70–99)
Potassium: 3.4 mmol/L — ABNORMAL LOW (ref 3.5–5.1)
Sodium: 136 mmol/L (ref 135–145)

## 2020-05-01 LAB — GLUCOSE, CAPILLARY
Glucose-Capillary: 207 mg/dL — ABNORMAL HIGH (ref 70–99)
Glucose-Capillary: 164 mg/dL — ABNORMAL HIGH (ref 70–99)
Glucose-Capillary: 204 mg/dL — ABNORMAL HIGH (ref 70–99)
Glucose-Capillary: 167 mg/dL — ABNORMAL HIGH (ref 70–99)

## 2020-05-01 LAB — CBC
HCT: 28.4 % — ABNORMAL LOW (ref 39.0–52.0)
Hemoglobin: 9.3 g/dL — ABNORMAL LOW (ref 13.0–17.0)
MCH: 32.5 pg (ref 26.0–34.0)
MCHC: 32.7 g/dL (ref 30.0–36.0)
MCV: 99.3 fL (ref 80.0–100.0)
Platelets: 120 10*3/uL — ABNORMAL LOW (ref 150–400)
RBC: 2.86 MIL/uL — ABNORMAL LOW (ref 4.22–5.81)
RDW: 16.2 % — ABNORMAL HIGH (ref 11.5–15.5)
WBC: 4.7 10*3/uL (ref 4.0–10.5)
nRBC: 0 % (ref 0.0–0.2)

## 2020-05-01 MED ORDER — CARISOPRODOL 350 MG PO TABS
350.0000 mg | ORAL_TABLET | Freq: Three times a day (TID) | ORAL | Status: DC | PRN
  Administered 2020-05-01 – 2020-05-02 (×2): 350 mg via ORAL
  Filled 2020-05-01 (×2): qty 1

## 2020-05-01 MED ORDER — INSULIN GLARGINE 100 UNIT/ML ~~LOC~~ SOLN
10.0000 [IU] | Freq: Every day | SUBCUTANEOUS | Status: DC
  Administered 2020-05-01: 10 [IU] via SUBCUTANEOUS
  Filled 2020-05-01 (×2): qty 0.1

## 2020-05-01 MED ORDER — CEPHALEXIN 500 MG PO CAPS
500.0000 mg | ORAL_CAPSULE | Freq: Three times a day (TID) | ORAL | Status: DC
  Administered 2020-05-01 – 2020-05-02 (×3): 500 mg via ORAL
  Filled 2020-05-01 (×3): qty 1

## 2020-05-01 MED ORDER — POTASSIUM CHLORIDE CRYS ER 20 MEQ PO TBCR
40.0000 meq | EXTENDED_RELEASE_TABLET | Freq: Every day | ORAL | Status: DC
  Administered 2020-05-01 – 2020-05-02 (×2): 40 meq via ORAL
  Filled 2020-05-01 (×2): qty 2

## 2020-05-01 MED ORDER — MUPIROCIN 2 % EX OINT
TOPICAL_OINTMENT | CUTANEOUS | Status: DC
  Filled 2020-05-01 (×2): qty 22

## 2020-05-01 NOTE — Progress Notes (Signed)
[facility] PROGRESS NOTE    [name] [name]  GEX:[id number] DOB: Apr 03, 1960 DOA: 04/30/2020 PCP: [name] Cords, DO      Brief Narrative:  Mr. [name] [name] is a 60 y.o. 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  Had been recommended for discharge to SNF at last hospitalization, refused, went home.  At home, was unable to get out of a chair, called EMS.  In the ER, WBC normal, Cr stable, K 3.2, Mag 1.7, lactate normal.  UA with pyuria.      Assessment & Plan:  Recent COVID with residual physical deconditioning -Consult TOC   UTI -Narrow to cephalexin  Chronic systolic and diastolic CHF Hypertension Volume status difficult to assess, CXR without overt CHF BP labile -Continue lisinopril, metoprolol, torsemide  Hypothyroidism -Continue levothyroxine  Hypokalemia -Replete K  Diabetes with neuropathy Glucose elevated -Continue aspirin -Continue atorvastatin -Continue duloxetine, gababpentin -Continue SS corrections -Start lantus  Persistent atrial fibrillation Rates elevated -Continue Eliquis -Continue metorpolol  Morbid obesity BMI >60  Pressure injury stage II POA -Consult WOC   Anemia Hgb stable, no clinical bleeding        Disposition: Status is: Observation  The patient will require care spanning > 2 midnights and should be moved to inpatient because: Unsafe d/c plan  Dispo: The patient is from: Home              Anticipated d/c is to: SNF              Patient currently is medically stable to d/c.   Difficult to place patient Yes     Patient presented from home with inability to sit up from a chair.    Outside SW notes question whehter he has a safe home environment, and a referal to APS was  initiated by home SW.    Certainly here he is unable to safely transfer, and we cannot corroborate that he does not live alone.  Given his quick readmission, we feel discharge to home is unsafe, and seek placement to the patient's liking   Level of care: Med-Surg       MDM: The below labs and imaging reports were reviewed and summarized above.  Medication management as above.    DVT prophylaxis:  apixaban (ELIQUIS) tablet 5 mg  Code Status: FULL Family Communication:            Subjective: Patient's right shoulder hurts, his back hurts, he is fatigued and weak.  He also has some dysuria.  Otherwise, no fever, vomiting, confusion, chest pain, change in dyspnea.       Objective: Vitals:   05/01/20 0300 05/01/20 0834 05/01/20 1307 05/01/20 1540  BP: 111/84 (!) 142/127 127/73 (!) 101/53  Pulse: (!) 103 91 85 92  Resp: 18 16 15 16   Temp: 98.1 F (36.7 C) 99.3 F (37.4 C) 98.5 F (36.9 C) 99.1 F (37.3 C)  TempSrc:  Oral Oral Oral  SpO2: 100% 100% 95% 99%  Weight:      Height:        Intake/Output Summary (Last 24 hours) at 05/01/2020 1733 Last data filed at 05/01/2020 0000 Gross per 24 hour  Intake 120 ml  Output 700 ml  Net -580 ml   Filed Weights   04/30/20 0700  Weight: (!) 186 kg  Examination: General appearance: Obese adult male, alert and in no obvious distress.  Lying in bed, appears debilitated HEENT: Anicteric, conjunctiva pink, lids and lashes normal. No nasal deformity, discharge, epistaxis.  Lips moist, mostly edentulous, oropharynx moist, no oral lesions, hearing normal.   Skin: Warm and dry.  Numerous scattered ecchymoses, no jaundice.  No suspicious rashes or lesions. Cardiac: RRR, nl S1-S2, no murmurs appreciated.  Capillary refill is brisk.  JVPnot visible.  No LE edema.  Radial pulses 2+ and symmetric. Respiratory: Normal respiratory rate and rhythm.  CTAB without rales or wheezes. Abdomen: Abdomen soft.  No TTP or guarding. No  ascites, distension, hepatosplenomegaly.   MSK: No deformities or effusions. Neuro: Awake and alert.  EOMI, moves all extremities with severe generalized weakness, unable to transfer without . Speech fluent.    Psych: Sensorium intact and responding to questions, attention normal. Affect irritable.  Judgment and insight appear normal.    Data Reviewed: I have personally reviewed following labs and imaging studies:  CBC: Recent Labs  Lab 04/25/20 0801 04/30/20 0958 05/01/20 0518  WBC 7.3 8.5 4.7  NEUTROABS 5.0 7.0  --   HGB 11.6* 10.3* 9.3*  HCT 35.8* 31.4* 28.4*  MCV 99.2 96.6 99.3  PLT 173 151 120*   Basic Metabolic Panel: Recent Labs  Lab 04/25/20 0425 04/25/20 0801 04/30/20 0958 05/01/20 0518  NA  --  138 135 136  K  --  3.1* 3.2* 3.4*  CL  --  89* 88* 90*  CO2  --  39* 35* 36*  GLUCOSE  --  128* 145* 206*  BUN  --  30* 26* 18  CREATININE  --  0.74 0.89 0.65  CALCIUM  --  8.6* 8.7* 8.8*  MG 1.7  --  1.7  --    GFR: Estimated Creatinine Clearance: 162.3 mL/min (by C-G formula based on SCr of 0.65 mg/dL). Liver Function Tests: Recent Labs  Lab 04/30/20 0958  AST 25  ALT 41  ALKPHOS 64  BILITOT 0.9  PROT 6.3*  ALBUMIN 3.1*   No results for input(s): LIPASE, AMYLASE in the last 168 hours. No results for input(s): AMMONIA in the last 168 hours. Coagulation Profile: No results for input(s): INR, PROTIME in the last 168 hours. Cardiac Enzymes: Recent Labs  Lab 04/30/20 0925  CKTOTAL 37*   BNP (last 3 results) No results for input(s): PROBNP in the last 8760 hours. HbA1C: No results for input(s): HGBA1C in the last 72 hours. CBG: Recent Labs  Lab 04/30/20 1752 04/30/20 2129 05/01/20 0839 05/01/20 1311 05/01/20 1536  GLUCAP 137* 222* 207* 164* 204*   Lipid Profile: No results for input(s): CHOL, HDL, LDLCALC, TRIG, CHOLHDL, LDLDIRECT in the last 72 hours. Thyroid Function Tests: No results for input(s): TSH, T4TOTAL, FREET4, T3FREE, THYROIDAB  in the last 72 hours. Anemia Panel: No results for input(s): VITAMINB12, FOLATE, FERRITIN, TIBC, IRON, RETICCTPCT in the last 72 hours. Urine analysis:    Component Value Date/Time   COLORURINE AMBER (A) 04/30/2020 0807   APPEARANCEUR CLOUDY (A) 04/30/2020 0807   APPEARANCEUR Hazy (A) 12/25/2018 1442   LABSPEC 1.020 04/30/2020 0807   PHURINE 5.0 04/30/2020 0807   GLUCOSEU NEGATIVE 04/30/2020 0807   HGBUR MODERATE (A) 04/30/2020 0807   BILIRUBINUR NEGATIVE 04/30/2020 0807   BILIRUBINUR Negative 12/25/2018 1442   KETONESUR NEGATIVE 04/30/2020 0807   PROTEINUR 100 (A) 04/30/2020 0807   NITRITE NEGATIVE 04/30/2020 0807   LEUKOCYTESUR LARGE (A) 04/30/2020 0807   Sepsis Labs: @LABRCNTIP (procalcitonin:4,lacticacidven:4)  )  Recent Results (from the past 240 hour(s))  Blood culture (routine x 2)     Status: None (Preliminary result)   Collection Time: 04/30/20  9:59 AM   Specimen: BLOOD  Result Value Ref Range Status   Specimen Description BLOOD RIGHT AC  Final   Special Requests   Final    BOTTLES DRAWN AEROBIC AND ANAEROBIC Blood Culture adequate volume   Culture   Final    NO GROWTH < 24 HOURS Performed at [name], [address] [place], [place] [address]    Report Status PENDING  Incomplete  Blood culture (routine x 2)     Status: None (Preliminary result)   Collection Time: 04/30/20 10:09 AM   Specimen: BLOOD  Result Value Ref Range Status   Specimen Description BLOOD RIGHT ASSIST CONTROL  Final   Special Requests   Final    BOTTLES DRAWN AEROBIC AND ANAEROBIC Blood Culture adequate volume   Culture   Final    NO GROWTH < 24 HOURS Performed at [facility], [address] [place], [place] [address]    Report Status PENDING  Incomplete         Radiology Studies: DG Chest Portable 1 View  Result Date: 04/30/2020 CLINICAL DATA:  Weakness.  Evaluate for edema EXAM: PORTABLE CHEST 1 VIEW COMPARISON:  04/13/2020 FINDINGS: Chronic  cardiomegaly. Improved vascular congestion from before. Improved atelectasis from 04/06/2020 chest CT. There is no edema, consolidation, effusion, or pneumothorax. Normal aortic and hilar contours. IMPRESSION: 1. Improved aeration when compared to February imaging. 2. Cardiomegaly and borderline vascular congestion. Electronically Signed   By: Marnee Spring M.D.   On: 04/30/2020 07:48        Scheduled Meds: . apixaban  5 mg Oral BID  . aspirin  81 mg Oral Daily  . atorvastatin  40 mg Oral Daily  . cephALEXin  500 mg Oral Q8H  . DULoxetine  20 mg Oral Daily  . insulin aspart  0-20 Units Subcutaneous TID WC  . insulin glargine  10 Units Subcutaneous Daily  . levothyroxine  75 mcg Oral QAC breakfast  . lisinopril  2.5 mg Oral Daily  . magnesium oxide  400 mg Oral BID  . metoprolol succinate  100 mg Oral Daily  . montelukast  10 mg Oral QHS  . multivitamin with minerals  1 tablet Oral Daily  . mupirocin ointment   Topical See admin instructions  . potassium chloride  40 mEq Oral Daily  . sodium chloride flush  3 mL Intravenous Q12H  . torsemide  80 mg Oral BID  . traZODone  150 mg Oral QHS   Continuous Infusions: . sodium chloride       LOS: 0 days    Time spent: 25 minutes    Alberteen Sam, MD [facility] 05/01/2020, 5:33 PM     Please page though AMION or Epic secure chat:  For [employer], Higher education careers adviser

## 2020-05-01 NOTE — Progress Notes (Signed)
Physical Therapy Treatment Patient Details Name: [name] [name] MRN: [id number] DOB: Mar 19, 1960 Today's Date: 05/01/2020    History of Present Illness [name] [name] is a 59yoM who comes to [facility] on 04/30/20 after severe weakness at home, unable to stand despite help from his lift chair. PMH: obesity hypoventilation syndrome, CRF on CPAP at home, recent admission c cardiac arrest and 10 min ACLS, intubation, recent Rt shoulder injury. Pt recently admitted, home for <2d, mobility with our services included 30-45sec standing tolerance, falls anxiety that precluded true AMB of appreciable distances. PTA pt lives witha roommate who assists minimally, baseline AMB tolerance is ~50ft c 4WW, uses his power WC for mobility much of time.    PT Comments    Pt was seated EOB upon arriving. Therapist was contacted by RN staff with pt requesting to get OOB. RN staff did not feel safe gettng pt OOB without assistance. Pt is well known by Thereasa Parkin and treated during previous admission ~ 1 week prior. Pt was able to stand from elevated bed height 2 x prior to standing and taking ~ 3 steps to turn to sit in recliner. Hoyer pad placed under pt for Visual merchandiser with returning pt to bed. PT recommends RN staff stand pivot pt with +2 assist for safety prior to use of mechanic lift. Pt would greatly benefit form SNF at DC to address deficits while improving activity tolerance. Pt has been unwilling to sign check over to go to SNF in previous admission. Will continue to promote SNF to patient throughout hospitalization.   Follow Up Recommendations  SNF     Equipment Recommendations  None recommended by PT    Recommendations for Other Services       Precautions / Restrictions Precautions Precautions: Fall Precaution Comments: Watch Rt shoulder Restrictions Weight Bearing Restrictions: No    Mobility  Bed Mobility      General bed mobility comments: pt was I'ly seated EOB upon arriving     Transfers Overall transfer level: Needs assistance Equipment used: Rolling walker (2 wheeled) (bariatric) Transfers: Sit to/from Stand Sit to Stand: Min guard         General transfer comment: CGA for safety to stand at EOB.  Ambulation/Gait Ambulation/Gait assistance: Min guard Gait Distance (Feet): 3 Feet Assistive device: Rolling walker (2 wheeled) (bariatric) Gait Pattern/deviations: Step-to pattern Gait velocity: decreased           Balance Overall balance assessment: History of Falls;Mild deficits observed, not formally tested Sitting-balance support: Feet supported Sitting balance-Leahy Scale: Good     Standing balance support: Bilateral upper extremity supported;During functional activity Standing balance-Leahy Scale: Fair           Cognition Arousal/Alertness: Awake/alert Behavior During Therapy: WFL for tasks assessed/performed Overall Cognitive Status: Within Functional Limits for tasks assessed        General Comments: Pt is A and O x 4       Pertinent Vitals/Pain Pain Assessment: No/denies pain           PT Goals (current goals can now be found in the care plan section) Acute Rehab PT Goals Patient Stated Goal: Regain baseline level of independence with basic mobility. Progress towards PT goals: Progressing toward goals    Frequency    Min 2X/week      PT Plan Current plan remains appropriate    Co-evaluation     PT goals addressed during session: Mobility/safety with mobility;Balance;Proper use of DME;Strengthening/ROM        AM-PAC  PT "6 Clicks" Mobility   Outcome Measure  Help needed turning from your back to your side while in a flat bed without using bedrails?: A Little Help needed moving from lying on your back to sitting on the side of a flat bed without using bedrails?: A Little Help needed moving to and from a bed to a chair (including a wheelchair)?: A Little Help needed standing up from a chair using your  arms (e.g., wheelchair or bedside chair)?: A Little Help needed to walk in hospital room?: A Lot Help needed climbing 3-5 steps with a railing? : Total 6 Click Score: 15    End of Session         PT Visit Diagnosis: Other abnormalities of gait and mobility (R26.89);Muscle weakness (generalized) (M62.81);Difficulty in walking, not elsewhere classified (R26.2);Unsteadiness on feet (R26.81)     Time: 0109-3235 PT Time Calculation (min) (ACUTE ONLY): 24 min  Charges:  $Therapeutic Activity: 23-37 mins                     Jetta Lout PTA 05/01/20, 4:43 PM
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
## 2020-05-01 NOTE — Consult Note (Signed)
WOC Nurse Consult Note: Reason for Consult:Chronic lower extremity edema and weeping skin with nonintact lesion to left lateral lower leg.  Patient has a care giver at home that can wrap with gauze and ace bandage.  HAs not been able to get back to wound care center lately.  When he asks about compression, I inform him that with no skilled care giver, he will need to follow up regularly with wound care center.  Wraps must be changed consistently.  He agrees he will go "when he can"  He is noticeably short of breath today, on oxygen, but improved from when I last saw him one month ago.  I will implement modified light compression that will be more effective than ace wraps.  Wound type:inflammatory   Pressure Injury POA: Yes  nonintact skin to buttocks, improving.   Measurement:LEft lateral leg:  1 cm x 1 cm x 0.1 cm lesion  HE indicates this is an old wound that has opened up.   Wound MGN:OIBBCW Drainage (amount, consistency, odor) moderate weeping Periwound:chronic skin changes.  Generalized edema Dressing procedure/placement/frequency:Bedside RNs to perform. Cleanse legs with soap and water and pat dry.  Apply mupirocin ointment to wound bed on left leg.  COver with dry gauze.  Wrap legs with kerlix from below toe to below knee and secure with self adherent coban Change Tues/Thurs/Sat.  Will not follow at this time.  Please re-consult if needed.  Maple Hudson MSN, RN, FNP-BC [facility]ON Wound, Ostomy, Continence Nurse Pager [phone]

## 2020-05-02 ENCOUNTER — Telehealth: Payer: Self-pay | Admitting: Family Medicine

## 2020-05-02 DIAGNOSIS — R531 Weakness: Secondary | ICD-10-CM

## 2020-05-02 DIAGNOSIS — N3000 Acute cystitis without hematuria: Secondary | ICD-10-CM | POA: Diagnosis not present

## 2020-05-02 MED ORDER — OXYCODONE-ACETAMINOPHEN 7.5-325 MG PO TABS: 1.0000 | ORAL_TABLET | Freq: Four times a day (QID) | ORAL | 0 refills | Status: DC | PRN

## 2020-05-02 MED ORDER — POTASSIUM CHLORIDE CRYS ER 20 MEQ PO TBCR: 20.0000 meq | EXTENDED_RELEASE_TABLET | Freq: Every day | ORAL | 3 refills | Status: DC

## 2020-05-02 MED ORDER — CEFADROXIL 500 MG PO CAPS
500.0000 mg | ORAL_CAPSULE | Freq: Two times a day (BID) | ORAL | 0 refills | Status: DC
Start: 2020-05-02 — End: 2020-05-16

## 2020-05-02 NOTE — Discharge Summary (Signed)
Physician Discharge Summary  [name] [name] WPV:[id number] DOB: [birth year] DOA: 04/30/2020  PCP: [name], DO  Admit date: 04/30/2020 Discharge date: 05/02/2020  Admitted From: Home   Disposition:  Home with [facility]   Recommendations for Outpatient Follow-up:  1. Follow up with PCP Dr. Althea Charon in 1 week 2. Dr. Althea Charon: Please offer or refer patient for cortisone shot in right shoulder 3. Dr. Althea Charon: Please continue discussions with patient of entering ALF 4. Dr. Althea Charon: Please obtain BMP in 1 week, to follow K      Home Health: PT/OT/RN/Aide due to patients weakness and inability to leave the house safely alone  Equipment/Devices: Bariatric walker  Discharge Condition: Impaired  CODE STATUS: FULL Diet recommendation: Diabetes  Brief/Interim Summary: Mr. [name] [name] is a 60 y.o. 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  Had been recommended for discharge to SNF at last hospitalization, refused, went home.  At home, was unable to get out of a chair, called EMS to help him get out, they encouraged patient to come to the hospital due to inability to care for self at home.  In the ER, WBC normal, Cr stable, K 3.2, Mag 1.7, lactate normal.  UA with pyuria.     PRINCIPAL HOSPITAL DIAGNOSIS: Weakness due to long COVID    Discharge Diagnoses:   Recent COVID with residual physical deconditioning Patient's chief problem is that he can only transfer from chair to a high bed, cannot get out of a lower chair, cannot walk more than about 5 feet due to long COVID, as well as baseline PAH, obesity, and OHS.  Here, we recommended SNF for short term rehab again strongly, but the patient refused.  Thankfully, we were able to maximize [facility] services  through [facility].     Acute cystitis Patient with pyuria on UA and endorsing dysuria.  Unfortunately started on Rocephin without obtaining urine culture.  Narrowed to cephalexin, symptoms resolved, disharged to complete 5 days.       Chronic systolic and diastolic CHF Hypertension Volume status difficult to assess, CXR without overt CHF Continue lisinopril, metoprolol, torsemide  Hypothyroidism Continue levothyroxine  Hypokalemia Repleted  Diabetes with neuropathy Continue aspirin, statin, duloxetine, gabapentin    Persistent atrial fibrillation Continue Eliquis, metoprolol   Morbid obesity BMI >60  Pressure injury stage II POA    Anemia Hgb stable, no clinical bleeding              Discharge Instructions  Discharge Instructions    Discharge instructions   Complete by: As directed    You were admitted for being unable to get out of a chair Here, we found that you had a urinary tract infection You should finish treatment for this by taking cefadroxil 500 mg twice daily for 3 more days  As we recommended, you should not go home but you should go to a skilled rehab center.  I recognize the disruption to your finances that this would cause, so I understand your decision not to do so.  We have tried to arrange you with as many resources through [facility] that we can, but this will not substitute for rehab.  Longer term, I recommend that you move into long-term care.  I recognize again the sacrifices this would require, but think it would be healthier and more satisfying and  safer for you.   Go see Dr. Althea Charon in 1 week Have him check your labs  Resume your home medicines as listed below (I have written a script for potassium pills instead of packets for you) Go see the pain clinic as soon as you can   Discharge wound care:   Complete by: As directed    Cleanse legs with soap and water and pat dry Apply mupirocin ointment to wound bed on left  leg Cover with dry gauze Wrap legs with Kerlix from below toe to below knee and secure with self-adherent Coban Change Tuesday, Thursday and Saturday   Increase activity slowly   Complete by: As directed      Allergies as of 05/02/2020   No Known Allergies     Medication List    STOP taking these medications   potassium chloride 20 MEQ packet Commonly known as: KLOR-CON     TAKE these medications   albuterol 108 (90 Base) MCG/ACT inhaler Commonly known as: VENTOLIN HFA Inhale 2 puffs into the lungs every 4 (four) hours as needed for wheezing or shortness of breath.   apixaban 5 MG Tabs tablet Commonly known as: ELIQUIS Take 1 tablet (5 mg total) by mouth 2 (two) times daily.   aspirin 81 MG chewable tablet Chew 1 tablet (81 mg total) by mouth daily.   atorvastatin 40 MG tablet Commonly known as: LIPITOR TAKE 1 TABLET BY MOUTH ONCE DAILY   cefadroxil 500 MG capsule Commonly known as: DURICEF Take 1 capsule (500 mg total) by mouth 2 (two) times daily.   DULoxetine 20 MG capsule Commonly known as: CYMBALTA Take 1 capsule (20 mg total) by mouth daily.   levothyroxine 75 MCG tablet Commonly known as: SYNTHROID Take 1 tablet (75 mcg total) by mouth daily before breakfast.   lisinopril 2.5 MG tablet Commonly known as: ZESTRIL Take by mouth.   loperamide 2 MG capsule Commonly known as: IMODIUM Take 1 capsule (2 mg total) by mouth as needed for diarrhea or loose stools.   magnesium oxide 400 (241.3 Mg) MG tablet Commonly known as: MAG-OX Take 1 tablet (400 mg total) by mouth 2 (two) times daily.   meclizine 25 MG tablet Commonly known as: ANTIVERT Take 0.5-1 tablets (12.5-25 mg total) by mouth 3 (three) times daily as needed for dizziness.   methocarbamol 500 MG tablet Commonly known as: Robaxin Take 1 tablet (500 mg total) by mouth every 8 (eight) hours as needed for up to 7 days for muscle spasms.   metoprolol succinate 50 MG 24 hr tablet Commonly known as:  TOPROL-XL Take 2 tablets (100 mg total) by mouth daily. Take with or immediately following a meal.   montelukast 10 MG tablet Commonly known as: SINGULAIR Take 1 tablet (10 mg total) by mouth at bedtime.   multivitamin with minerals Tabs tablet Take 1 tablet by mouth daily.   naphazoline-glycerin 0.012-0.2 % Soln Commonly known as: CLEAR EYES REDNESS Place 1-2 drops into both eyes 4 (four) times daily as needed for eye irritation.   nitroGLYCERIN 0.4 MG SL tablet Commonly known as: NITROSTAT Place 1 tablet under tongue every 5 minutes as needed for chest pain. (No more than 3 doses within 15 minutes)   oxyCODONE-acetaminophen 7.5-325 MG tablet Commonly known as: PERCOCET Take 1 tablet by mouth every 6 (six) hours as needed for moderate pain.   potassium chloride SA 20 MEQ tablet Commonly known as: KLOR-CON Take 1 tablet (20 mEq total) by mouth daily.  SUMAtriptan 50 MG tablet Commonly known as: IMITREX Take 1 tablet (50 mg total) by mouth every 2 (two) hours as needed for migraine or headache. May repeat in 2 hours if headache persists or recurs. No more than 2 doses in a day   torsemide 20 MG tablet Commonly known as: DEMADEX Take 4 tablets (80 mg total) by mouth 2 (two) times daily.   traZODone 150 MG tablet Commonly known as: DESYREL Take 1 tablet (150 mg total) by mouth at bedtime.            Discharge Care Instructions  (From admission, onward)         Start     Ordered   05/02/20 0000  Discharge wound care:       Comments: Cleanse legs with soap and water and pat dry Apply mupirocin ointment to wound bed on left leg Cover with dry gauze Wrap legs with Kerlix from below toe to below knee and secure with self-adherent Coban Change Tuesday, Thursday and Saturday   05/02/20 8119          Follow-up Information    [name], DO. Schedule an appointment as soon as possible for a visit in 1 week(s).   Specialty: Family Medicine Contact  information: [address] [place] [address] [phone]              No Known Allergies    Procedures/Studies: DG Chest 1 View  Result Date: 04/06/2020 CLINICAL DATA:  Status post advancement of endotracheal tube. EXAM: CHEST  1 VIEW COMPARISON:  Single-view of the chest earlier today. FINDINGS: Endotracheal tube tip is in good position at the level the clavicular heads. OG tube side port is in the distal esophagus. Recommend advancement of the tube by approximately 11 cm. There is cardiomegaly and vascular congestion. Mild left basilar atelectasis. IMPRESSION: ETT in good position. OG tube side port is in the esophagus. Recommend advancement of approximately 11 cm. Cardiomegaly and vascular congestion. Electronically Signed   By: Drusilla Kanner M.D.   On: 04/06/2020 14:55   DG Chest 1 View  Result Date: 04/06/2020 CLINICAL DATA:  Status post intubation. EXAM: CHEST  1 VIEW COMPARISON:  01/27/2020 FINDINGS: 1232 hours. Low volume film. Endotracheal tube not definitely visualized although the tip may project over the T1 vertebral body. The cardio pericardial silhouette is enlarged. There is pulmonary vascular congestion probable interstitial edema no substantial pleural effusion. Telemetry leads overlie the chest. IMPRESSION: 1. Endotracheal tube not definitely visualized although the tip may project over the T1 vertebral body. 2. Pulmonary vascular congestion and probable interstitial edema. Electronically Signed   By: Kennith Center M.D.   On: 04/06/2020 13:23   DG Shoulder Right  Result Date: 04/20/2020 CLINICAL DATA:  Acute RIGHT shoulder pain. EXAM: RIGHT SHOULDER - 2+ VIEW COMPARISON:  None. FINDINGS: No evidence of acute fracture, subluxation or dislocation. No focal bony lesions are identified. Mild degenerative changes at the [facility] joint noted. IMPRESSION: No acute abnormality. Electronically Signed   By: Harmon Pier M.D.   On: 04/20/2020 13:19   DG Abd 1 View  Result Date:  04/10/2020 CLINICAL DATA:  Orogastric tube placement. EXAM: ABDOMEN - 1 VIEW COMPARISON:  04/08/2020 FINDINGS: The enteric tube has been retracted in the interim with tip now projecting in the region of the proximal gastric body. The side hole remains below the GE junction. No dilated loops of bowel are seen in the included upper portion of the abdomen. The lung  bases were more fully evaluated on today's chest radiograph. IMPRESSION: Interval retraction of enteric tube into the proximal stomach. Electronically Signed   By: Sebastian Ache M.D.   On: 04/10/2020 11:03   DG Abd 1 View  Result Date: 04/08/2020 CLINICAL DATA:  ICU patient EXAM: ABDOMEN - 1 VIEW on 4 cassettes COMPARISON:  Same day chest radiograph and abdominal radiograph April 06, 2020 FINDINGS: Orogastric tube with tip and side port projecting over the gastric body. The bowel gas pattern is normal. Similar pulmonary infiltrates/edema. Stable small left pleural effusion. IMPRESSION: Orogastric tube with tip and side port projecting over the stomach. No evidence of bowel obstruction. Electronically Signed   By: Maudry Mayhew MD   On: 04/08/2020 11:17   DG Abd 1 View  Result Date: 04/06/2020 CLINICAL DATA:  OG tube placement EXAM: ABDOMEN - 1 VIEW COMPARISON:  None. FINDINGS: The OG tube projects over the gastric body. The tube is likely kinked at the side port. The visualized bowel gas pattern is unremarkable. IMPRESSION: The OG tube projects over the gastric body. The tube is likely kinked at the side port. Electronically Signed   By: Katherine Mantle M.D.   On: 04/06/2020 20:48   CT Angio Chest PE W and/or Wo Contrast  Result Date: 04/06/2020 CLINICAL DATA:  60 year old male with positive COVID-19. Concern for pulmonary embolism. EXAM: CT ANGIOGRAPHY CHEST WITH CONTRAST TECHNIQUE: Multidetector CT imaging of the chest was performed using the standard protocol during bolus administration of intravenous contrast. Multiplanar CT image  reconstructions and MIPs were obtained to evaluate the vascular anatomy. CONTRAST:  OMNIPAQUE IOHEXOL 350 MG/ML SOLN COMPARISON:  Chest CT dated 01/27/2020 FINDINGS: Evaluation is very limited due to body habitus and streak artifact caused by patient's arms. Cardiovascular: There is mild cardiomegaly. Retrograde flow of contrast from the right atrium into the IVC suggestive of a degree of right heart dysfunction. Mild atherosclerotic calcification of the thoracic aorta. Evaluation of the pulmonary arteries is very limited due to respiratory motion artifact and streak artifact caused by patient's arms. No obvious large central pulmonary artery embolus identified. Mediastinum/Nodes: No definite hilar or mediastinal adenopathy. An enteric tube within the esophagus extending into the body of the stomach. No mediastinal fluid collection. Lungs/Pleura: Probable trace bilateral pleural effusions. Bilateral lower lobe subsegmental and streaky densities may represent atelectasis or scarring. Infiltrate is not excluded. Linear left upper lobe probable atelectasis or scarring. No pneumothorax. The central airways remain patent. Upper Abdomen: Fatty liver. Musculoskeletal: Degenerative changes of the spine. No acute osseous pathology. Review of the MIP images confirms the above findings. IMPRESSION: 1. No obvious large central pulmonary artery embolus identified. 2. Probable trace bilateral pleural effusions. Bilateral lower lobe subsegmental and streaky densities may represent atelectasis or scarring. Infiltrate is not excluded. 3. Aortic Atherosclerosis (ICD10-I70.0). Electronically Signed   By: Elgie Collard M.D.   On: 04/06/2020 15:58   DG Chest Portable 1 View  Result Date: 04/30/2020 CLINICAL DATA:  Weakness.  Evaluate for edema EXAM: PORTABLE CHEST 1 VIEW COMPARISON:  04/13/2020 FINDINGS: Chronic cardiomegaly. Improved vascular congestion from before. Improved atelectasis from 04/06/2020 chest CT. There is  no edema, consolidation, effusion, or pneumothorax. Normal aortic and hilar contours. IMPRESSION: 1. Improved aeration when compared to February imaging. 2. Cardiomegaly and borderline vascular congestion. Electronically Signed   By: Marnee Spring M.D.   On: 04/30/2020 07:48   DG Chest Port 1 View  Result Date: 04/13/2020 CLINICAL DATA:  60 year old male with history of shortness of  breath. EXAM: PORTABLE CHEST 1 VIEW COMPARISON:  Chest x-ray 04/12/2020. FINDINGS: There is a right-sided internal jugular central venous catheter with tip terminating in the mid superior vena cava. Patient has been extubated. Nasogastric tube has been removed. Lung volumes are low. Persistent bibasilar opacities (left greater than right), which may reflect areas of atelectasis and/or consolidation. Small left pleural effusion. No definite right pleural effusion. Pulmonary venous congestion, without frank pulmonary edema. Heart size is mildly enlarged. Upper mediastinal contours are within normal limits. IMPRESSION: 1. Support apparatus, as above. 2. Cardiomegaly with pulmonary venous congestion. 3. Improving aeration in the lung bases with persistent atelectasis and/or consolidation bilaterally (left greater than right), and small left pleural effusion. Electronically Signed   By: Trudie Reedaniel  Entrikin M.D.   On: 04/13/2020 07:14   DG Chest Port 1 View  Result Date: 04/12/2020 CLINICAL DATA:  Respiratory failure EXAM: PORTABLE CHEST 1 VIEW COMPARISON:  04/11/2020 FINDINGS: Endotracheal tube is been placed 7.0 cm above the carina. Nasogastric tube can be followed as far as the expected distal esophagus where evaluation is subsequently limited by underpenetration. Esophageal Doppler probe is again noted. Right internal jugular central venous catheter tip noted within the superior vena cava. Bibasilar opacification persists possibly related to overlying soft tissue, however, small posteriorly layering bilateral pleural effusions are  difficult to exclude. Mild cardiomegaly is unchanged. Mild central pulmonary vascular congestion without overt pulmonary edema is unchanged. No pneumothorax. IMPRESSION: Slightly limited examination. Nasogastric tube tip is indeterminate. Otherwise stable support lines and tubes. Stable cardiomegaly and central pulmonary vascular congestion. Possible small bilateral pleural effusions. Electronically Signed   By: Helyn NumbersAshesh  Parikh MD   On: 04/12/2020 06:35   DG Chest Port 1 View  Result Date: 04/11/2020 CLINICAL DATA:  Hypoxia EXAM: PORTABLE CHEST 1 VIEW COMPARISON:  April 10, 2020 FINDINGS: Endotracheal tube tip is 3.9 cm above the carina. Nasogastric tube tip and side port are below the diaphragm. Central catheter tip is in the superior vena cava near the cavoatrial junction. No pneumothorax. Cardiomegaly with is again noted with a degree of pulmonary venous hypertension. There are small pleural effusions bilaterally. No appreciable airspace consolidation. No adenopathy. No bone lesions. IMPRESSION: Tube and catheter positions as described without pneumothorax. Stable cardiomegaly with a degree of pulmonary vascular congestion. Small pleural effusions bilaterally. No consolidation. Electronically Signed   By: Bretta BangWilliam  Woodruff III M.D.   On: 04/11/2020 07:59   DG Chest Port 1 View  Result Date: 04/10/2020 CLINICAL DATA:  Respiratory failure.  ET tube advanced. EXAM: PORTABLE CHEST 1 VIEW COMPARISON:  Chest x-ray 04/08/2020. FINDINGS: Endotracheal tube tip 4 cm above the carina. NG tube tip below left hemidiaphragm. Right IJ line stable position. Severe cardiomegaly again noted. Diffuse bilateral pulmonary infiltrates/edema, right side greater than left again noted. Small left pleural effusion again noted. No pneumothorax. IMPRESSION: 1. Endotracheal tube tip 4 cm above the carina. NG tube tip below left hemidiaphragm. Right IJ line stable position. 2. Severe cardiomegaly again noted. Diffuse bilateral  pulmonary infiltrates/edema, right side greater than left again noted. Small left pleural effusion again noted. Similar findings noted on prior exam. Electronically Signed   By: Maisie Fushomas  Register   On: 04/10/2020 07:21   DG Chest Port 1 View  Result Date: 04/08/2020 CLINICAL DATA:  Respiratory failure. EXAM: PORTABLE CHEST 1 VIEW COMPARISON:  Prior study 04/06/2020. FINDINGS: Endotracheal tube, NG tube, right IJ line in stable position. Stable cardio may bilateral pulmonary infiltrates/edema again noted. Similar findings on prior exam. Small left  pleural effusion. No pneumothorax. IMPRESSION: 1. Lines and tubes in stable position. 2. Bilateral pulmonary infiltrates/edema again noted. Similar findings noted on prior exam. Small left pleural effusion again. Electronically Signed   By: Maisie Fus  Register   On: 04/08/2020 05:13   DG Chest Port 1 View  Result Date: 04/06/2020 CLINICAL DATA:  Central line placement EXAM: PORTABLE CHEST 1 VIEW COMPARISON:  Earlier same day. FINDINGS: Endotracheal tube tip 5 cm above the carina. Orogastric or nasogastric tube enters the abdomen. Right internal jugular central line tip in the SVC 3 cm above the right atrium. No pneumothorax. Cardiomegaly persists. Pulmonary venous hypertension with mild interstitial edema, possibly slightly worsened. IMPRESSION: Right internal jugular central line tip in the SVC 3 cm above the right atrium. No pneumothorax. Pulmonary venous hypertension with mild interstitial edema, possibly slightly worsened. Electronically Signed   By: Paulina Fusi M.D.   On: 04/06/2020 23:56   ECHOCARDIOGRAM COMPLETE  Result Date: 04/07/2020    ECHOCARDIOGRAM REPORT   Patient Name:   CUAHUTEMOC ATTAR Date of Exam: 04/07/2020 Medical Rec #:  [id number]       Height:       69.0 in Accession #:    [id number]      Weight:       431.0 lb Date of Birth:  1961-02-09       BSA:          2.863 m Patient Age:    59 years        BP:           98/58 mmHg Patient Gender: M                HR:           68 bpm. Exam Location:  [facility] Procedure: 2D Echo, Color Doppler and Cardiac Doppler Indications:     R06.03 Acute Respiratory Distress  History:         Patient has prior history of Echocardiogram examinations, most                  recent 12/23/2019. NICM, HFpEF, COPD; Risk Factors:Sleep Apnea                  and Hypertension.  Sonographer:     Humphrey Rolls RDCS (AE) Referring Phys:  [id number] Salena Saner Diagnosing Phys: Harold Hedge MD  Sonographer Comments: Technically difficult study due to poor echo windows and echo performed with patient supine and on artificial respirator. Image acquisition challenging due to patient body habitus and Image acquisition challenging due to COPD. IMPRESSIONS  1. Left ventricular ejection fraction, by estimation, is 30 to 35%. Left ventricular ejection fraction by PLAX is 21 %. The left ventricle has moderate to severely decreased function. Left ventricular endocardial border not optimally defined to evaluate  regional wall motion. The left ventricular internal cavity size was mildly dilated. Left ventricular diastolic parameters were normal.  2. Right ventricular systolic function is normal. The right ventricular size is mildly enlarged.  3. Left atrial size was mildly dilated.  4. Right atrial size was mildly dilated.  5. The mitral valve was not well visualized. No evidence of mitral valve regurgitation.  6. The aortic valve was not well visualized. Aortic valve regurgitation is not visualized. FINDINGS  Left Ventricle: Left ventricular ejection fraction, by estimation, is 30 to 35%. Left ventricular ejection fraction by PLAX is 21 %. The left ventricle has moderate to severely decreased function. Left ventricular endocardial border not  optimally defined to evaluate regional wall motion. The left ventricular internal cavity size was mildly dilated. There is no left ventricular hypertrophy. Left ventricular diastolic parameters were normal. Right Ventricle:  The right ventricular size is mildly enlarged. No increase in right ventricular wall thickness. Right ventricular systolic function is normal. Left Atrium: Left atrial size was mildly dilated. Right Atrium: Right atrial size was mildly dilated. Pericardium: There is no evidence of pericardial effusion. Mitral Valve: The mitral valve was not well visualized. No evidence of mitral valve regurgitation. MV peak gradient, 4.3 mmHg. The mean mitral valve gradient is 2.0 mmHg. Tricuspid Valve: The tricuspid valve is not well visualized. Tricuspid valve regurgitation is trivial. Aortic Valve: The aortic valve was not well visualized. Aortic valve regurgitation is not visualized. Aortic valve mean gradient measures 6.0 mmHg. Aortic valve peak gradient measures 11.7 mmHg. Aortic valve area, by VTI measures 2.41 cm. Pulmonic Valve: The pulmonic valve was not well visualized. Pulmonic valve regurgitation is not visualized. Aorta: The aortic root was not well visualized. IAS/Shunts: The interatrial septum was not well visualized.  LEFT VENTRICLE PLAX 2D LV EF:         Left            Diastology                ventricular     LV e' medial:    4.35 cm/s                ejection        LV E/e' medial:  20.6                fraction by     LV e' lateral:   9.79 cm/s                PLAX is 21      LV E/e' lateral: 9.1                %. LVIDd:         6.00 cm LVIDs:         5.40 cm LV PW:         1.30 cm LV IVS:        0.90 cm LVOT diam:     2.60 cm LV SV:         75 LV SV Index:   26 LVOT Area:     5.31 cm  LEFT ATRIUM           Index LA diam:      5.10 cm 1.78 cm/m LA Vol (A2C): 81.9 ml 28.60 ml/m LA Vol (A4C): 62.5 ml 21.83 ml/m  AORTIC VALVE                    PULMONIC VALVE AV Area (Vmax):    2.80 cm     PV Vmax:       1.03 m/s AV Area (Vmean):   2.63 cm     PV Vmean:      61.900 cm/s AV Area (VTI):     2.41 cm     PV VTI:        0.139 m AV Vmax:           171.00 cm/s  PV Peak grad:  4.2 mmHg AV Vmean:          111.000 cm/s  PV Mean grad:  2.0 mmHg AV VTI:  0.313 m AV Peak Grad:      11.7 mmHg AV Mean Grad:      6.0 mmHg LVOT Vmax:         90.10 cm/s LVOT Vmean:        54.900 cm/s LVOT VTI:          0.142 m LVOT/AV VTI ratio: 0.45  AORTA Ao Root diam: 3.40 cm MITRAL VALVE MV Area (PHT): 3.13 cm    SHUNTS MV Area VTI:   2.85 cm    Systemic VTI:  0.14 m MV Peak grad:  4.3 mmHg    Systemic Diam: 2.60 cm MV Mean grad:  2.0 mmHg MV Vmax:       1.04 m/s MV Vmean:      59.8 cm/s MV Decel Time: 243 msec MV E velocity: 89.50 cm/s Harold Hedge MD Electronically signed by Harold Hedge MD Signature Date/Time: 04/07/2020/1:43:37 PM    Final (Updated)        Subjective: No fever, no dysuria, no confusion, no vomiting.  No dyspnea.  Patient is very weak and tired.  He has shoulder pain in the right.  Discharge Exam: Vitals:   05/02/20 0543 05/02/20 1026  BP: (!) 104/56 119/62  Pulse: (!) 103 88  Resp: 20   Temp: 98.9 F (37.2 C)   SpO2: 97%    Vitals:   05/01/20 1540 05/01/20 1956 05/02/20 0543 05/02/20 1026  BP: (!) 101/53 (!) 125/98 (!) 104/56 119/62  Pulse: 92 76 (!) 103 88  Resp: 16 (!) 22 20   Temp:   98.9 F (37.2 C)   TempSrc: Oral Oral    SpO2: 99% 98% 97%   Weight:      Height:        General: Pt is alert, awake, not in acute distress, sitting up in recliner Cardiovascular: RRR, nl S1-S2, no murmurs appreciated.   No LE edema.   Respiratory: Normal respiratory rate and rhythm.  CTAB without rales or wheezes. Abdominal: Abdomen soft and non-tender.  No distension or HSM.   Neuro/Psych: Strength symmetric in upper and lower extremities.  Judgment and insight appear normal.   The results of significant diagnostics from this hospitalization (including imaging, microbiology, ancillary and laboratory) are listed below for reference.     Microbiology: Recent Results (from the past 240 hour(s))  Urine Culture     Status: Abnormal (Preliminary result)   Collection Time: 04/30/20  8:07 AM    Specimen: Urine, Random  Result Value Ref Range Status   Specimen Description   Final    URINE, RANDOM Performed at [facility], [address] [place], [place] [address]    Special Requests   Final    NONE Performed at [facility], [address], [place], [place] [address]    Culture (A)  Final    >=100,000 COLONIES/mL KLEBSIELLA PNEUMONIAE SUSCEPTIBILITIES TO FOLLOW Performed at [facility], [address], [place], [place] [address]    Report Status PENDING  Incomplete  Blood culture (routine x 2)     Status: None (Preliminary result)   Collection Time: 04/30/20  9:59 AM   Specimen: BLOOD  Result Value Ref Range Status   Specimen Description BLOOD RIGHT [facility]  Final   Special Requests   Final    BOTTLES DRAWN AEROBIC AND ANAEROBIC Blood Culture adequate volume   Culture   Final    NO GROWTH 2 DAYS Performed at [facility], [address] [place],  [place] [address]    Report Status PENDING  Incomplete  Blood culture (routine x 2)     Status: None (Preliminary result)   Collection Time: 04/30/20 10:09 AM   Specimen: BLOOD  Result Value Ref Range Status   Specimen Description BLOOD RIGHT ASSIST CONTROL  Final   Special Requests   Final    BOTTLES DRAWN AEROBIC AND ANAEROBIC Blood Culture adequate volume   Culture   Final    NO GROWTH 2 DAYS Performed at [facility], [address] [place], [place] [address]    Report Status PENDING  Incomplete     Labs: BNP (last 3 results) Recent Labs    02/01/20 0612 04/06/20 1111 04/12/20 0350  BNP 221.2* 191.9* 940.1*   Basic Metabolic Panel: Recent Labs  Lab 04/30/20 0958 05/01/20 0518  NA 135 136  K 3.2* 3.4*  CL 88* 90*  CO2 35* 36*  GLUCOSE 145* 206*  BUN 26* 18  CREATININE 0.89 0.65  CALCIUM 8.7* 8.8*  MG 1.7  --    Liver Function Tests: Recent Labs  Lab 04/30/20 0958  AST 25  ALT 41  ALKPHOS 64  BILITOT 0.9  PROT 6.3*  ALBUMIN 3.1*   No results  for input(s): LIPASE, AMYLASE in the last 168 hours. No results for input(s): AMMONIA in the last 168 hours. CBC: Recent Labs  Lab 04/30/20 0958 05/01/20 0518  WBC 8.5 4.7  NEUTROABS 7.0  --   HGB 10.3* 9.3*  HCT 31.4* 28.4*  MCV 96.6 99.3  PLT 151 120*   Cardiac Enzymes: Recent Labs  Lab 04/30/20 0925  CKTOTAL 37*   BNP: Invalid input(s): POCBNP CBG: Recent Labs  Lab 04/30/20 2129 05/01/20 0839 05/01/20 1311 05/01/20 1536 05/01/20 2042  GLUCAP 222* 207* 164* 204* 167*   D-Dimer No results for input(s): DDIMER in the last 72 hours. Hgb A1c No results for input(s): HGBA1C in the last 72 hours. Lipid Profile No results for input(s): CHOL, HDL, LDLCALC, TRIG, CHOLHDL, LDLDIRECT in the last 72 hours. Thyroid function studies No results for input(s): TSH, T4TOTAL, T3FREE, THYROIDAB in the last 72 hours.  Invalid input(s): FREET3 Anemia work up No results for input(s): VITAMINB12, FOLATE, FERRITIN, TIBC, IRON, RETICCTPCT in the last 72 hours. Urinalysis    Component Value Date/Time   COLORURINE AMBER (A) 04/30/2020 0807   APPEARANCEUR CLOUDY (A) 04/30/2020 0807   APPEARANCEUR Hazy (A) 12/25/2018 1442   LABSPEC 1.020 04/30/2020 0807   PHURINE 5.0 04/30/2020 0807   GLUCOSEU NEGATIVE 04/30/2020 0807   HGBUR MODERATE (A) 04/30/2020 0807   BILIRUBINUR NEGATIVE 04/30/2020 0807   BILIRUBINUR Negative 12/25/2018 1442   KETONESUR NEGATIVE 04/30/2020 0807   PROTEINUR 100 (A) 04/30/2020 0807   NITRITE NEGATIVE 04/30/2020 0807   LEUKOCYTESUR LARGE (A) 04/30/2020 0807   Sepsis Labs Invalid input(s): PROCALCITONIN,  WBC,  LACTICIDVEN Microbiology Recent Results (from the past 240 hour(s))  Urine Culture     Status: Abnormal (Preliminary result)   Collection Time: 04/30/20  8:07 AM   Specimen: Urine, Random  Result Value Ref Range Status   Specimen Description   Final    URINE, RANDOM Performed at [facility], [address], [place], [place] [address]     Special Requests   Final    NONE Performed at [facility], [address] [place], [place] [address]    Culture (A)  Final    >=100,000 COLONIES/mL KLEBSIELLA PNEUMONIAE SUSCEPTIBILITIES TO FOLLOW Performed at [facility],  [address], [place], [place] [address]    Report Status PENDING  Incomplete  Blood culture (routine x 2)     Status: None (Preliminary result)   Collection Time: 04/30/20  9:59 AM   Specimen: BLOOD  Result Value Ref Range Status   Specimen Description BLOOD RIGHT [facility]  Final   Special Requests   Final    BOTTLES DRAWN AEROBIC AND ANAEROBIC Blood Culture adequate volume   Culture   Final    NO GROWTH 2 DAYS Performed at [facility], [address] [place], [place] [address]    Report Status PENDING  Incomplete  Blood culture (routine x 2)     Status: None (Preliminary result)   Collection Time: 04/30/20 10:09 AM   Specimen: BLOOD  Result Value Ref Range Status   Specimen Description BLOOD RIGHT ASSIST CONTROL  Final   Special Requests   Final    BOTTLES DRAWN AEROBIC AND ANAEROBIC Blood Culture adequate volume   Culture   Final    NO GROWTH 2 DAYS Performed at [facility], [address] [place], [place] [address]    Report Status PENDING  Incomplete     Time coordinating discharge: 35 minutes The [place] controlled substances registry was reviewed for this patient prior to filling the <5 days supply controlled substances script.      SIGNED:   Alberteen Sam, MD  [facility] 05/02/2020, 5:18 PM

## 2020-05-02 NOTE — Telephone Encounter (Signed)
[facility] calling to [facility] center is calling to request a script for the patient for gabapatin.  Original prescriber is Marvell Fuller. Advised the pharmacy that the gabapintin is not on the patient's medication lisit. Please advise Cb- [phone]

## 2020-05-02 NOTE — TOC Transition Note (Signed)
Transition of Care [facility]) - CM/SW Discharge Note   Patient Details  Name: [name] [name] MRN: [id number] Date of Birth: 03-22-1960  Transition of Care [facility] CM/SW Contact:  Barrie Dunker, RN Phone Number: 05/02/2020, 10:20 AM   Clinical Narrative:     CM set up the patient with Frances Furbish for PT, OT aide, and SW with his PCP The patient has oxygen at home and CPAP as well as lift chair Motorized wheelchair and 3 in 1, EMS will transport home, Spoke with [facility] Law[facility] Kobus and discussed DC home, Patient will not relinquish his check to be able to go to SNF.  Final next level of care: Home w Home Health Services Barriers to Discharge: Barriers Resolved   Patient Goals and CMS Choice Patient states their goals for this hospitalization and ongoing recovery are:: get stronger      Discharge Placement                       Discharge Plan and Services   Discharge Planning Services: CM Consult            DME Arranged: N/A         HH Arranged: PT,OT,Nurse's Aide HH Agency: [facility] Date [facility] Agency Contacted: 05/01/20 Time HH Agency Contacted: [phone] Representative spoke with at [facility] Agency: Kandee Keen  Social Determinants of Health (SDOH) Interventions     Readmission Risk Interventions Readmission Risk Prevention Plan 01/11/2020 12/24/2019 01/25/2019  Transportation Screening Complete Complete Complete  PCP or Specialist Appt within 3-5 Days - - -  HRI or Home Care Consult - - -  Social Work Consult for Recovery Care Planning/Counseling - - -  Palliative Care Screening - - -  Medication Review Oceanographer) Complete Complete Complete  PCP or Specialist appointment within 3-5 days of discharge Complete Complete Complete  HRI or Home Care Consult - - Complete  SW Recovery Care/Counseling Consult Complete Complete Complete  Palliative Care Screening Complete Not Applicable Not Applicable  Skilled Nursing Facility Not Applicable Not Applicable Not  Applicable

## 2020-05-02 NOTE — Progress Notes (Signed)
Discharge paperwork reviewed with patient. Verbalizes understanding of d/c plan, follow-up care, and wound care. All questions answered. Awaiting EMS transport.

## 2020-05-03 LAB — URINE CULTURE: Culture: >=100000 — AB

## 2020-05-04 ENCOUNTER — Emergency Department: Payer: Medicaid Other

## 2020-05-04 ENCOUNTER — Inpatient Hospital Stay
Admission: EM | Admit: 2020-05-04 | Discharge: 2020-05-16 | DRG: 291 | Disposition: A | Discharge status: Skilled Nursing Facility | Payer: Medicaid Other | Attending: Internal Medicine | Admitting: Internal Medicine

## 2020-05-04 ENCOUNTER — Other Ambulatory Visit: Payer: Self-pay

## 2020-05-04 DIAGNOSIS — E662 Morbid (severe) obesity with alveolar hypoventilation: Secondary | ICD-10-CM | POA: Diagnosis not present

## 2020-05-04 DIAGNOSIS — A082 Adenoviral enteritis: Secondary | ICD-10-CM | POA: Diagnosis present

## 2020-05-04 DIAGNOSIS — S46011A Strain of muscle(s) and tendon(s) of the rotator cuff of right shoulder, initial encounter: Secondary | ICD-10-CM | POA: Diagnosis not present

## 2020-05-04 DIAGNOSIS — J9622 Acute and chronic respiratory failure with hypercapnia: Secondary | ICD-10-CM | POA: Diagnosis not present

## 2020-05-04 DIAGNOSIS — E872 Acidosis, unspecified: Secondary | ICD-10-CM

## 2020-05-04 DIAGNOSIS — E785 Hyperlipidemia, unspecified: Secondary | ICD-10-CM | POA: Diagnosis present

## 2020-05-04 DIAGNOSIS — M109 Gout, unspecified: Secondary | ICD-10-CM | POA: Diagnosis present

## 2020-05-04 DIAGNOSIS — J449 Chronic obstructive pulmonary disease, unspecified: Secondary | ICD-10-CM | POA: Diagnosis not present

## 2020-05-04 DIAGNOSIS — G4733 Obstructive sleep apnea (adult) (pediatric): Secondary | ICD-10-CM | POA: Diagnosis not present

## 2020-05-04 DIAGNOSIS — F32A Depression, unspecified: Secondary | ICD-10-CM | POA: Diagnosis present

## 2020-05-04 DIAGNOSIS — J432 Centrilobular emphysema: Secondary | ICD-10-CM | POA: Diagnosis not present

## 2020-05-04 DIAGNOSIS — I110 Hypertensive heart disease with heart failure: Secondary | ICD-10-CM | POA: Diagnosis not present

## 2020-05-04 DIAGNOSIS — G894 Chronic pain syndrome: Secondary | ICD-10-CM | POA: Diagnosis not present

## 2020-05-04 DIAGNOSIS — I10 Essential (primary) hypertension: Secondary | ICD-10-CM | POA: Diagnosis present

## 2020-05-04 DIAGNOSIS — E119 Type 2 diabetes mellitus without complications: Secondary | ICD-10-CM | POA: Diagnosis present

## 2020-05-04 DIAGNOSIS — J9621 Acute and chronic respiratory failure with hypoxia: Secondary | ICD-10-CM | POA: Diagnosis not present

## 2020-05-04 DIAGNOSIS — R627 Adult failure to thrive: Secondary | ICD-10-CM | POA: Diagnosis present

## 2020-05-04 DIAGNOSIS — R0600 Dyspnea, unspecified: Secondary | ICD-10-CM | POA: Diagnosis present

## 2020-05-04 DIAGNOSIS — Z79899 Other long term (current) drug therapy: Secondary | ICD-10-CM

## 2020-05-04 DIAGNOSIS — Z801 Family history of malignant neoplasm of trachea, bronchus and lung: Secondary | ICD-10-CM

## 2020-05-04 DIAGNOSIS — E039 Hypothyroidism, unspecified: Secondary | ICD-10-CM | POA: Diagnosis not present

## 2020-05-04 DIAGNOSIS — I428 Other cardiomyopathies: Secondary | ICD-10-CM | POA: Diagnosis present

## 2020-05-04 DIAGNOSIS — R197 Diarrhea, unspecified: Secondary | ICD-10-CM

## 2020-05-04 DIAGNOSIS — K219 Gastro-esophageal reflux disease without esophagitis: Secondary | ICD-10-CM | POA: Diagnosis present

## 2020-05-04 DIAGNOSIS — Z789 Other specified health status: Secondary | ICD-10-CM | POA: Diagnosis not present

## 2020-05-04 DIAGNOSIS — R531 Weakness: Secondary | ICD-10-CM | POA: Diagnosis not present

## 2020-05-04 DIAGNOSIS — Z79891 Long term (current) use of opiate analgesic: Secondary | ICD-10-CM

## 2020-05-04 DIAGNOSIS — Z7901 Long term (current) use of anticoagulants: Secondary | ICD-10-CM | POA: Diagnosis not present

## 2020-05-04 DIAGNOSIS — I2510 Atherosclerotic heart disease of native coronary artery without angina pectoris: Secondary | ICD-10-CM | POA: Diagnosis not present

## 2020-05-04 DIAGNOSIS — I878 Other specified disorders of veins: Secondary | ICD-10-CM | POA: Diagnosis present

## 2020-05-04 DIAGNOSIS — I5033 Acute on chronic diastolic (congestive) heart failure: Secondary | ICD-10-CM | POA: Diagnosis not present

## 2020-05-04 DIAGNOSIS — I872 Venous insufficiency (chronic) (peripheral): Secondary | ICD-10-CM | POA: Diagnosis not present

## 2020-05-04 DIAGNOSIS — J441 Chronic obstructive pulmonary disease with (acute) exacerbation: Secondary | ICD-10-CM | POA: Diagnosis present

## 2020-05-04 DIAGNOSIS — R0902 Hypoxemia: Secondary | ICD-10-CM | POA: Diagnosis not present

## 2020-05-04 DIAGNOSIS — I517 Cardiomegaly: Secondary | ICD-10-CM | POA: Diagnosis not present

## 2020-05-04 DIAGNOSIS — I482 Chronic atrial fibrillation, unspecified: Secondary | ICD-10-CM | POA: Diagnosis not present

## 2020-05-04 DIAGNOSIS — X500XXA Overexertion from strenuous movement or load, initial encounter: Secondary | ICD-10-CM | POA: Diagnosis not present

## 2020-05-04 DIAGNOSIS — A419 Sepsis, unspecified organism: Secondary | ICD-10-CM | POA: Diagnosis not present

## 2020-05-04 DIAGNOSIS — A0472 Enterocolitis due to Clostridium difficile, not specified as recurrent: Secondary | ICD-10-CM | POA: Diagnosis not present

## 2020-05-04 DIAGNOSIS — Z8616 Personal history of COVID-19: Secondary | ICD-10-CM

## 2020-05-04 DIAGNOSIS — Z86718 Personal history of other venous thrombosis and embolism: Secondary | ICD-10-CM

## 2020-05-04 DIAGNOSIS — Z79890 Hormone replacement therapy (postmenopausal): Secondary | ICD-10-CM

## 2020-05-04 DIAGNOSIS — Z8249 Family history of ischemic heart disease and other diseases of the circulatory system: Secondary | ICD-10-CM

## 2020-05-04 DIAGNOSIS — Z6841 Body Mass Index (BMI) 40.0 and over, adult: Secondary | ICD-10-CM | POA: Diagnosis not present

## 2020-05-04 DIAGNOSIS — J811 Chronic pulmonary edema: Secondary | ICD-10-CM | POA: Diagnosis not present

## 2020-05-04 DIAGNOSIS — R7989 Other specified abnormal findings of blood chemistry: Secondary | ICD-10-CM

## 2020-05-04 DIAGNOSIS — Z7982 Long term (current) use of aspirin: Secondary | ICD-10-CM

## 2020-05-04 LAB — CBC WITH DIFFERENTIAL/PLATELET
Abs Immature Granulocytes: 0.02 10*3/uL (ref 0.00–0.07)
Basophils Absolute: 0 10*3/uL (ref 0.0–0.1)
Basophils Relative: 0 %
Eosinophils Absolute: 0.6 10*3/uL — ABNORMAL HIGH (ref 0.0–0.5)
Eosinophils Relative: 11 %
HCT: 32.8 % — ABNORMAL LOW (ref 39.0–52.0)
Hemoglobin: 10.6 g/dL — ABNORMAL LOW (ref 13.0–17.0)
Immature Granulocytes: 0 %
Lymphocytes Relative: 25 %
Lymphs Abs: 1.2 10*3/uL (ref 0.7–4.0)
MCH: 32.1 pg (ref 26.0–34.0)
MCHC: 32.3 g/dL (ref 30.0–36.0)
MCV: 99.4 fL (ref 80.0–100.0)
Monocytes Absolute: 0.7 10*3/uL (ref 0.1–1.0)
Monocytes Relative: 14 %
Neutro Abs: 2.4 10*3/uL (ref 1.7–7.7)
Neutrophils Relative %: 50 %
Platelets: 195 10*3/uL (ref 150–400)
RBC: 3.3 MIL/uL — ABNORMAL LOW (ref 4.22–5.81)
RDW: 15.8 % — ABNORMAL HIGH (ref 11.5–15.5)
WBC: 4.9 10*3/uL (ref 4.0–10.5)
nRBC: 0 % (ref 0.0–0.2)

## 2020-05-04 LAB — GASTROINTESTINAL PANEL BY PCR, STOOL (REPLACES STOOL CULTURE)
Adenovirus F40/41: DETECTED — AB
Astrovirus: NOT DETECTED
Campylobacter species: NOT DETECTED
Cryptosporidium: NOT DETECTED
Cyclospora cayetanensis: NOT DETECTED
Entamoeba histolytica: NOT DETECTED
Enteroaggregative E coli (EAEC): NOT DETECTED
Enteropathogenic E coli (EPEC): NOT DETECTED
Enterotoxigenic E coli (ETEC): NOT DETECTED
Giardia lamblia: NOT DETECTED
Norovirus GI/GII: NOT DETECTED
Plesimonas shigelloides: NOT DETECTED
Rotavirus A: NOT DETECTED
Salmonella species: NOT DETECTED
Sapovirus (I, II, IV, and V): NOT DETECTED
Shiga like toxin producing E coli (STEC): NOT DETECTED
Shigella/Enteroinvasive E coli (EIEC): NOT DETECTED
Vibrio cholerae: NOT DETECTED
Vibrio species: NOT DETECTED
Yersinia enterocolitica: NOT DETECTED

## 2020-05-04 LAB — COMPREHENSIVE METABOLIC PANEL
ALT: 32 U/L (ref 0–44)
AST: 27 U/L (ref 15–41)
Albumin: 3.2 g/dL — ABNORMAL LOW (ref 3.5–5.0)
Alkaline Phosphatase: 59 U/L (ref 38–126)
Anion gap: 14 (ref 5–15)
BUN: 14 mg/dL (ref 6–20)
CO2: 30 mmol/L (ref 22–32)
Calcium: 8.6 mg/dL — ABNORMAL LOW (ref 8.9–10.3)
Chloride: 88 mmol/L — ABNORMAL LOW (ref 98–111)
Creatinine, Ser: 0.86 mg/dL (ref 0.61–1.24)
GFR, Estimated: >60 mL/min (ref >60)
Glucose, Bld: 133 mg/dL — ABNORMAL HIGH (ref 70–99)
Potassium: 3.6 mmol/L (ref 3.5–5.1)
Sodium: 132 mmol/L — ABNORMAL LOW (ref 135–145)
Total Bilirubin: 0.7 mg/dL (ref 0.3–1.2)
Total Protein: 6.4 g/dL — ABNORMAL LOW (ref 6.5–8.1)

## 2020-05-04 LAB — BLOOD GAS, VENOUS
Acid-Base Excess: 7.5 mmol/L — ABNORMAL HIGH (ref 0.0–2.0)
Bicarbonate: 34.5 mmol/L — ABNORMAL HIGH (ref 20.0–28.0)
O2 Saturation: 66.3 %
Patient temperature: 37
pCO2, Ven: 61 mmHg — ABNORMAL HIGH (ref 44.0–60.0)
pH, Ven: 7.36 (ref 7.250–7.430)
pO2, Ven: 36 mmHg (ref 32.0–45.0)

## 2020-05-04 LAB — URINALYSIS, COMPLETE (UACMP) WITH MICROSCOPIC
Bacteria, UA: NONE SEEN
Bilirubin Urine: NEGATIVE
Glucose, UA: NEGATIVE mg/dL
Hgb urine dipstick: NEGATIVE
Ketones, ur: NEGATIVE mg/dL
Nitrite: NEGATIVE
Protein, ur: NEGATIVE mg/dL
Specific Gravity, Urine: 1.015 (ref 1.005–1.030)
pH: 5 (ref 5.0–8.0)

## 2020-05-04 LAB — BRAIN NATRIURETIC PEPTIDE: B Natriuretic Peptide: 63.7 pg/mL (ref 0.0–100.0)

## 2020-05-04 LAB — CLOSTRIDIUM DIFFICILE BY PCR, REFLEXED: Toxigenic C. Difficile by PCR: POSITIVE — AB

## 2020-05-04 LAB — PROTIME-INR
INR: 1.3 — ABNORMAL HIGH (ref 0.8–1.2)
Prothrombin Time: 15.3 seconds — ABNORMAL HIGH (ref 11.4–15.2)

## 2020-05-04 LAB — LACTIC ACID, PLASMA: Lactic Acid, Venous: 3.8 mmol/L (ref 0.5–1.9)

## 2020-05-04 LAB — TROPONIN I (HIGH SENSITIVITY)
Troponin I (High Sensitivity): 24 ng/L — ABNORMAL HIGH (ref <18)
Troponin I (High Sensitivity): 23 ng/L — ABNORMAL HIGH (ref <18)

## 2020-05-04 LAB — PROCALCITONIN: Procalcitonin: <0.1 ng/mL

## 2020-05-04 LAB — LIPASE, BLOOD: Lipase: 59 U/L — ABNORMAL HIGH (ref 11–51)

## 2020-05-04 LAB — APTT: aPTT: 32 seconds (ref 24–36)

## 2020-05-04 LAB — C DIFFICILE QUICK SCREEN W PCR REFLEX
C Diff antigen: POSITIVE — AB
C Diff toxin: NEGATIVE

## 2020-05-04 MED ORDER — DULOXETINE HCL 20 MG PO CPEP
20.0000 mg | ORAL_CAPSULE | Freq: Every day | ORAL | Status: DC
  Administered 2020-05-05 – 2020-05-16 (×12): 20 mg via ORAL
  Filled 2020-05-04 (×12): qty 1

## 2020-05-04 MED ORDER — VANCOMYCIN 50 MG/ML ORAL SOLUTION
125.0000 mg | Freq: Four times a day (QID) | ORAL | Status: AC
  Administered 2020-05-04 – 2020-05-13 (×38): 125 mg via ORAL
  Filled 2020-05-04 (×40): qty 2.5

## 2020-05-04 MED ORDER — OXYCODONE-ACETAMINOPHEN 7.5-325 MG PO TABS
1.0000 | ORAL_TABLET | Freq: Four times a day (QID) | ORAL | Status: DC | PRN
  Administered 2020-05-04 – 2020-05-05 (×2): 1 via ORAL
  Filled 2020-05-04 (×2): qty 1

## 2020-05-04 MED ORDER — TRAZODONE HCL 100 MG PO TABS
100.0000 mg | ORAL_TABLET | Freq: Once | ORAL | Status: AC
  Administered 2020-05-04: 100 mg via ORAL
  Filled 2020-05-04: qty 1

## 2020-05-04 MED ORDER — ONDANSETRON HCL 4 MG/2ML IJ SOLN: 4.0000 mg | Freq: Four times a day (QID) | INTRAMUSCULAR | Status: DC | PRN

## 2020-05-04 MED ORDER — NAPROXEN 500 MG PO TABS
500.0000 mg | ORAL_TABLET | Freq: Two times a day (BID) | ORAL | Status: AC | PRN
  Administered 2020-05-04 – 2020-05-11 (×3): 500 mg via ORAL
  Filled 2020-05-04 (×5): qty 1

## 2020-05-04 MED ORDER — ACETAMINOPHEN 500 MG PO TABS
1000.0000 mg | ORAL_TABLET | Freq: Three times a day (TID) | ORAL | Status: DC | PRN
  Administered 2020-05-04 – 2020-05-06 (×3): 1000 mg via ORAL
  Filled 2020-05-04 (×3): qty 2

## 2020-05-04 MED ORDER — LEVOTHYROXINE SODIUM 50 MCG PO TABS
75.0000 ug | ORAL_TABLET | Freq: Every day | ORAL | Status: DC
  Administered 2020-05-05 – 2020-05-16 (×11): 75 ug via ORAL
  Filled 2020-05-04 (×6): qty 2
  Filled 2020-05-04: qty 1.5
  Filled 2020-05-04 (×5): qty 2

## 2020-05-04 MED ORDER — METOPROLOL SUCCINATE ER 50 MG PO TB24
50.0000 mg | ORAL_TABLET | Freq: Every day | ORAL | Status: DC
  Administered 2020-05-04 – 2020-05-16 (×13): 50 mg via ORAL
  Filled 2020-05-04 (×13): qty 1

## 2020-05-04 MED ORDER — SODIUM CHLORIDE 0.9% FLUSH: 3.0000 mL | INTRAVENOUS | Status: DC | PRN

## 2020-05-04 MED ORDER — ACETAMINOPHEN 325 MG PO TABS
650.0000 mg | ORAL_TABLET | ORAL | Status: DC | PRN
  Administered 2020-05-04: 650 mg via ORAL
  Filled 2020-05-04: qty 2

## 2020-05-04 MED ORDER — LACTATED RINGERS IV BOLUS (SEPSIS)
500.0000 mL | Freq: Once | INTRAVENOUS | Status: AC
  Administered 2020-05-04: 500 mL via INTRAVENOUS

## 2020-05-04 MED ORDER — APIXABAN 5 MG PO TABS
5.0000 mg | ORAL_TABLET | Freq: Two times a day (BID) | ORAL | Status: DC
  Administered 2020-05-04 – 2020-05-16 (×25): 5 mg via ORAL
  Filled 2020-05-04 (×25): qty 1

## 2020-05-04 MED ORDER — DM-GUAIFENESIN ER 30-600 MG PO TB12
1.0000 | ORAL_TABLET | Freq: Two times a day (BID) | ORAL | Status: DC
  Administered 2020-05-04 – 2020-05-16 (×24): 1 via ORAL
  Filled 2020-05-04 (×24): qty 1

## 2020-05-04 MED ORDER — SODIUM CHLORIDE 0.9 % IV SOLN: INTRAVENOUS | Status: AC

## 2020-05-04 MED ORDER — TRAZODONE HCL 50 MG PO TABS
150.0000 mg | ORAL_TABLET | Freq: Every day | ORAL | Status: DC
  Administered 2020-05-04 – 2020-05-15 (×12): 150 mg via ORAL
  Filled 2020-05-04 (×12): qty 1

## 2020-05-04 MED ORDER — LACTATED RINGERS IV BOLUS: 1000.0000 mL | Freq: Once | INTRAVENOUS | Status: DC

## 2020-05-04 MED ORDER — ATORVASTATIN CALCIUM 20 MG PO TABS
40.0000 mg | ORAL_TABLET | Freq: Every day | ORAL | Status: DC
  Administered 2020-05-05 – 2020-05-16 (×12): 40 mg via ORAL
  Filled 2020-05-04 (×12): qty 2

## 2020-05-04 MED ORDER — CARISOPRODOL 350 MG PO TABS
350.0000 mg | ORAL_TABLET | Freq: Three times a day (TID) | ORAL | Status: DC
Start: 2020-05-04 — End: 2020-05-16
  Administered 2020-05-04 – 2020-05-16 (×37): 350 mg via ORAL
  Filled 2020-05-04 (×36): qty 1

## 2020-05-04 MED ORDER — SODIUM CHLORIDE 0.9 % IV SOLN: 250.0000 mL | INTRAVENOUS | Status: DC | PRN

## 2020-05-04 MED ORDER — SODIUM CHLORIDE 0.9% FLUSH
3.0000 mL | Freq: Two times a day (BID) | INTRAVENOUS | Status: DC
  Administered 2020-05-05 – 2020-05-16 (×22): 3 mL via INTRAVENOUS

## 2020-05-04 MED ORDER — TRAZODONE HCL 100 MG PO TABS: 100.0000 mg | ORAL_TABLET | Freq: Every day | ORAL | Status: DC

## 2020-05-04 NOTE — Progress Notes (Addendum)
PROGRESS NOTE    [name] [name]  LFY:[id number] DOB: May 11, 1960 DOA: 05/04/2020 PCP: [name], DO  [id number]/[id number]   Assessment & Plan:   Principal Problem:   Acute on chronic respiratory failure with hypoxia and hypercapnia (HCC) Active Problems:   Acute on chronic diastolic CHF (congestive heart failure) (HCC)   Obstructive sleep apnea   Sepsis (HCC)   Chronic atrial fibrillation (HCC)   HTN (hypertension)   Morbid obesity with BMI of 60.0-69.9, adult (HCC)   CAD (coronary artery disease)   Hypothyroidism   Chronic venous stasis dermatitis of both lower extremities   Chronic anticoagulation (warfarin  COUMADIN)   COPD (chronic obstructive pulmonary disease) (HCC)   Dyspnea and respiratory abnormality   Clostridium difficile diarrhea   Elevated lactic acid level   Unable to care for self   [name] [name] is a 60 y.o. male with medical history significant for Class III obesity, BMI over 60, depression, HFrEF with last EF 30 to 35%, COPD, OSA chronic respiratory failure on home O2 at 3 L and on CPAP, A. fib and history of DVT on Eliquis, CAD recently hospitalized from 2/22 - 3/14 with complications of Covid pneumonia requiring intubation, rehospitalized from 3/16-3/18, who returns to the emergency room with complaints of protracted weakness, inability to manage at home.    Patient, who lives alone was apparently discharged on 3/18 after he declined skilled rehab placement.  He states that he would really like to go to rehab but is unwilling to give up his check because he has no other way to pay his bills.  Patient had called EMS multiple times since being DC from hospital on 3/18 needing assistance of moving to and from bed to chair and needing assistance with toilet and hygiene.  Patient is unable to care for self at home alone.   # Chronic respiratory failure with hypoxia and hypercapnia on 2-3L home O2 # COPD # OSA on CPAP nightly --pt has chronic dyspnea and  BLE edema.  BNP actually better than baseline on presentation.  PCO2 61 up from baseline of 50-55 though with normal pH, and could be due to missing his nightly CPAP. --on presentation, sating 100% on home 3L O2. Plan: --cont home 2-3L supplemental O2 --cont CPAP while sleeping  Possible Clostridium difficile diarrhea -Patient noted to have frequent watery stools in the ER, which started after presentation. -C. difficile antigen positive, and PCR positive.  No fever, no leukocytosis, but since having diarrhea, oral vanc started on presentation. Plan: --cont oral vanc  Sepsis ruled out --No fever, no leukocytosis, procal neg.    Lactic acidosis  --3.8 on admission.  Unclear etiology. --NS@50  for 10 hours --repeat lactic acid tomorrow morning  chronic diastolic CHF (exacerbation ruled out)  Chronic anticoagulation Chronic atrial fibrillation (HCC) History of DVT --cont home Toprol -Continue Eliquis   Morbid obesity with BMI of 60.0-69.9, with physical deconditioning Unable to care for self --disposition pending discussion with TOC --PT/OT while inpatient  Chronic venous stasis dermatitis both lower extremities --edema and erythema equal bilaterally, does not have infection. --ACE wrap both legs    CAD (coronary artery disease) -Continue home meds    Hypothyroidism -Continue levothyroxine    Depression -Continue antidepressants  Chronic MSK pain --pt asked for narcotics, however, currently does not have an outpatient narcotics Rx.  Plan: --No indication for narcotics pain medication currently --cont home Soma --Tylenol and Naproxen PRN for pain   DVT prophylaxis: EN:IDPOEUM Code Status: Full code  Family  Communication:  Level of care: Med-Surg Dispo:   The patient is from: home Anticipated d/c is to: undetermined Anticipated d/c date is: undetermined Patient currently is not medically ready to d/c due to: unable to care for self at home, calling EMS  repeatedly.     Subjective and Interval History:  Pt reported diarrhea x 3 since presentation.  Before presentation, he had constipation.  Pt complained of not being able to sleep without CPAP.  Too weak to get up on his own.     Objective: Vitals:   05/04/20 0804 05/04/20 1240 05/04/20 1258 05/04/20 1618  BP: 117/88 114/66    Pulse: 90 80  85  Resp: (!) 21 18  18   Temp:      TempSrc:      SpO2: 100% 93% 99% 99%  Weight:      Height:        Intake/Output Summary (Last 24 hours) at 05/04/2020 1641 Last data filed at 05/04/2020 0111 Gross per 24 hour  Intake -  Output 700 ml  Net -700 ml   Filed Weights   05/04/20 0045 05/04/20 0111  Weight: (!) 186 kg (!) 188.2 kg    Examination:   Constitutional: NAD, AAOx3 HEENT: conjunctivae and lids normal, EOMI CV: No cyanosis.   RESP: normal respiratory effort, on 2L Extremities: chronic edema in BLE with bilateral erythema  SKIN: warm, dry Neuro: II - XII grossly intact.     Data Reviewed: I have personally reviewed following labs and imaging studies  CBC: Recent Labs  Lab 04/30/20 0958 05/01/20 0518 05/04/20 0137  WBC 8.5 4.7 4.9  NEUTROABS 7.0  --  2.4  HGB 10.3* 9.3* 10.6*  HCT 31.4* 28.4* 32.8*  MCV 96.6 99.3 99.4  PLT 151 120* 195   Basic Metabolic Panel: Recent Labs  Lab 04/30/20 0958 05/01/20 0518 05/04/20 0137  NA 135 136 132*  K 3.2* 3.4* 3.6  CL 88* 90* 88*  CO2 35* 36* 30  GLUCOSE 145* 206* 133*  BUN 26* 18 14  CREATININE 0.89 0.65 0.86  CALCIUM 8.7* 8.8* 8.6*  MG 1.7  --   --    GFR: Estimated Creatinine Clearance: 152.1 mL/min (by C-G formula based on SCr of 0.86 mg/dL). Liver Function Tests: Recent Labs  Lab 04/30/20 0958 05/04/20 0137  AST 25 27  ALT 41 32  ALKPHOS 64 59  BILITOT 0.9 0.7  PROT 6.3* 6.4*  ALBUMIN 3.1* 3.2*   Recent Labs  Lab 05/04/20 0137  LIPASE 59*   No results for input(s): AMMONIA in the last 168 hours. Coagulation Profile: Recent Labs  Lab  05/04/20 0256  INR 1.3*   Cardiac Enzymes: Recent Labs  Lab 04/30/20 0925  CKTOTAL 37*   BNP (last 3 results) No results for input(s): PROBNP in the last 8760 hours. HbA1C: No results for input(s): HGBA1C in the last 72 hours. CBG: Recent Labs  Lab 04/30/20 2129 05/01/20 0839 05/01/20 1311 05/01/20 1536 05/01/20 2042  GLUCAP 222* 207* 164* 204* 167*   Lipid Profile: No results for input(s): CHOL, HDL, LDLCALC, TRIG, CHOLHDL, LDLDIRECT in the last 72 hours. Thyroid Function Tests: No results for input(s): TSH, T4TOTAL, FREET4, T3FREE, THYROIDAB in the last 72 hours. Anemia Panel: No results for input(s): VITAMINB12, FOLATE, FERRITIN, TIBC, IRON, RETICCTPCT in the last 72 hours. Sepsis Labs: Recent Labs  Lab 04/30/20 0958 05/04/20 0137 05/04/20 0256  PROCALCITON  --  <0.10  --   LATICACIDVEN 1.5  --  3.8*  Recent Results (from the past 240 hour(s))  Urine Culture     Status: Abnormal   Collection Time: 04/30/20  8:07 AM   Specimen: Urine, Random  Result Value Ref Range Status   Specimen Description   Final    URINE, RANDOM Performed at [facility], [address], [place], [place] [address]    Special Requests   Final    NONE Performed at [facility], [address] [place], [place] [address]    Culture >=100,000 COLONIES/mL KLEBSIELLA PNEUMONIAE (A)  Final   Report Status 05/03/2020 FINAL  Final   Organism ID, Bacteria KLEBSIELLA PNEUMONIAE (A)  Final      Susceptibility   Klebsiella pneumoniae - MIC*    AMPICILLIN >=32 RESISTANT Resistant     CEFAZOLIN <=4 SENSITIVE Sensitive     CEFEPIME <=0.12 SENSITIVE Sensitive     CEFTRIAXONE <=0.25 SENSITIVE Sensitive     CIPROFLOXACIN <=0.25 SENSITIVE Sensitive     GENTAMICIN <=1 SENSITIVE Sensitive     IMIPENEM <=0.25 SENSITIVE Sensitive     NITROFURANTOIN <=16 SENSITIVE Sensitive     TRIMETH/SULFA <=20 SENSITIVE Sensitive     AMPICILLIN/SULBACTAM 4 SENSITIVE Sensitive     PIP/TAZO 8  SENSITIVE Sensitive     * >=100,000 COLONIES/mL KLEBSIELLA PNEUMONIAE  Blood culture (routine x 2)     Status: None (Preliminary result)   Collection Time: 04/30/20  9:59 AM   Specimen: BLOOD  Result Value Ref Range Status   Specimen Description BLOOD RIGHT [facility]  Final   Special Requests   Final    BOTTLES DRAWN AEROBIC AND ANAEROBIC Blood Culture adequate volume   Culture   Final    NO GROWTH 4 DAYS Performed at [facility], [address], [place], [place] [address]    Report Status PENDING  Incomplete  Blood culture (routine x 2)     Status: None (Preliminary result)   Collection Time: 04/30/20 10:09 AM   Specimen: BLOOD  Result Value Ref Range Status   Specimen Description BLOOD RIGHT ASSIST CONTROL  Final   Special Requests   Final    BOTTLES DRAWN AEROBIC AND ANAEROBIC Blood Culture adequate volume   Culture   Final    NO GROWTH 4 DAYS Performed at [facility], [address] [place], [place] [address]    Report Status PENDING  Incomplete  Blood culture (routine single)     Status: None (Preliminary result)   Collection Time: 05/04/20  1:37 AM   Specimen: BLOOD  Result Value Ref Range Status   Specimen Description BLOOD  RTAC  Final   Special Requests   Final    BOTTLES DRAWN AEROBIC ONLY Blood Culture results may not be optimal due to an inadequate volume of blood received in culture bottles   Culture   Final    NO GROWTH < 12 HOURS Performed at [facility], [address] [place], [place] [address]    Report Status PENDING  Incomplete  C Difficile Quick Screen w PCR reflex     Status: Abnormal   Collection Time: 05/04/20  3:24 AM   Specimen: STOOL  Result Value Ref Range Status   C Diff antigen POSITIVE (A) NEGATIVE Final   C Diff toxin NEGATIVE NEGATIVE Final   C Diff interpretation Results are indeterminate. See PCR results.  Final    Comment: Performed at [facility], [address] [place], [place] [address]  C.  Diff by PCR, Reflexed  Status: Abnormal   Collection Time: 05/04/20  3:24 AM  Result Value Ref Range Status   Toxigenic C. Difficile by PCR POSITIVE (A) NEGATIVE Final    Comment: Performed at [facility], [address] [place], [place] [address]  Gastrointestinal Panel by PCR , Stool     Status: Abnormal   Collection Time: 05/04/20  3:33 AM   Specimen: STOOL  Result Value Ref Range Status   Campylobacter species NOT DETECTED NOT DETECTED Final   Plesimonas shigelloides NOT DETECTED NOT DETECTED Final   Salmonella species NOT DETECTED NOT DETECTED Final   Yersinia enterocolitica NOT DETECTED NOT DETECTED Final   Vibrio species NOT DETECTED NOT DETECTED Final   Vibrio cholerae NOT DETECTED NOT DETECTED Final   Enteroaggregative E coli (EAEC) NOT DETECTED NOT DETECTED Final   Enteropathogenic E coli (EPEC) NOT DETECTED NOT DETECTED Final   Enterotoxigenic E coli (ETEC) NOT DETECTED NOT DETECTED Final   Shiga like toxin producing E coli (STEC) NOT DETECTED NOT DETECTED Final   Shigella/Enteroinvasive E coli (EIEC) NOT DETECTED NOT DETECTED Final   Cryptosporidium NOT DETECTED NOT DETECTED Final   Cyclospora cayetanensis NOT DETECTED NOT DETECTED Final   Entamoeba histolytica NOT DETECTED NOT DETECTED Final   Giardia lamblia NOT DETECTED NOT DETECTED Final   Adenovirus F40/41 DETECTED (A) NOT DETECTED Final   Astrovirus NOT DETECTED NOT DETECTED Final   Norovirus GI/GII NOT DETECTED NOT DETECTED Final   Rotavirus A NOT DETECTED NOT DETECTED Final   Sapovirus (I, II, IV, and V) NOT DETECTED NOT DETECTED Final    Comment: Performed at [facility], [address] [place], [place] [address]      Radiology Studies: DG Chest Port 1 View  Result Date: 05/04/2020 CLINICAL DATA:  Weakness EXAM: PORTABLE CHEST 1 VIEW COMPARISON:  Radiograph 04/13/2020 FINDINGS: Low lung volumes. Persistent bibasilar opacities are slightly the increased from comparison. Could reflect  regions of atelectasis or consolidation in the setting of suspected sepsis. No visible pleural effusion the portions of the right costophrenic sulci are collimated from view. No pneumothorax. Cardiomegaly is similar to prior. Mild pulmonary vascular congestion. No acute osseous or soft tissue abnormality. Telemetry leads overlie the chest. IMPRESSION: Persistent bibasilar opacities, slightly increased from comparison. Could reflect regions of atelectasis or consolidation in the setting of suspected sepsis. Cardiomegaly and vascular congestion. Early mild edema could present with a similar appearance as well. Electronically Signed   By: Kreg Shropshire M.D.   On: 05/04/2020 02:12     Scheduled Meds: . apixaban  5 mg Oral BID  . [START ON 05/05/2020] atorvastatin  40 mg Oral Daily  . carisoprodol  350 mg Oral TID  . [START ON 05/05/2020] DULoxetine  20 mg Oral Daily  . [START ON 05/05/2020] levothyroxine  75 mcg Oral Q0600  . metoprolol succinate  50 mg Oral Daily  . sodium chloride flush  3 mL Intravenous Q12H  . traZODone  150 mg Oral QHS  . vancomycin  125 mg Oral QID   Continuous Infusions: . sodium chloride    . sodium chloride 50 mL/hr at 05/04/20 1012     LOS: 0 days   No charge note.   Darlin Priestly, MD [facility] If 7PM-7AM, please contact night-coverage 05/04/2020, 4:41 PM

## 2020-05-04 NOTE — ED Triage Notes (Signed)
Patient from home via [facility] c/o weakness. Patient had called EMS multiple times since being DC from hospital on 3/18 needing assistance of moving to and from bed to chair and needing assistance with toilet and hygiene. Patient is non weightbearing and unable to care for self at home alone.  EMS states patient needs placement and home care assistance.

## 2020-05-04 NOTE — ED Notes (Signed)
Pt had liquid stool, bed linens changed, pericare provided, pt refused flexseal, no other needs at this time, awaiting baribed

## 2020-05-04 NOTE — ED Notes (Signed)
Patient repositioned in bed. Linens changed to the best of our ability. New clean pads put down under patient. Patient provided pericare, and a new external catheter. Rectal tube also checked, and leakage around tube was cleaned. Patient was provided with a water cup and a remote. Lights dimmed for patient comfort. Patient is requesting a cpap for sleep, admitting provider contacted for orders.

## 2020-05-04 NOTE — ED Notes (Signed)
Pt had liquid stool, continues to refuse flexiseal. pericare provided and pt moved over to hospital bed

## 2020-05-04 NOTE — ED Notes (Signed)
Critical lactic: 3.8 reported from lab. Dr York Cerise and primary RN informed.

## 2020-05-04 NOTE — ED Notes (Signed)
Vancomycin not found in patient medication storage area. Pharmacy contacted to request dose to be sent.

## 2020-05-04 NOTE — ED Notes (Addendum)
Telephone report given to inpatient RN by Maia Breslow, RN.
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
## 2020-05-04 NOTE — ED Notes (Signed)
Patient bed linens changed to the best of my ability. Patient reports difficulty rolling and turning in the bed. New clean incontinence pads placed under patient on all sides.

## 2020-05-04 NOTE — H&P (Addendum)
History and Physical    [name] [name] EXN:[id number] DOB: [birth year] DOA: 05/04/2020  PCP: [name], DO   Patient coming from: Home  I have personally briefly reviewed patient's old medical records in [facility]  Chief Complaint: Shortness of breath, weakness  HPI: [name] [name] is a 60 y.o. male with medical history significant for Class III obesity, BMI over 60, depression, HFrEF with last EF 30 to 35%, COPD, OSA chronic respiratory failure on home O2 at 3 L and on CPAP, A. fib and history of DVT on Eliquis, CAD recently hospitalized from 2/22 - 3/14 with complications of Covid pneumonia requiring intubation, rehospitalized from 3/16-3/18, who returns to the emergency room with complaints of protracted weakness, inability to manage at home as well as shortness of breath and orthopnea and increased lower extremity edema.  He denies chest pain, cough fever or chills.  He also has diarrhea but denies abdominal pain, nausea or vomiting.  Patient, who lives alone was apparently discharged on 3/18 after he declined skilled rehab placement.  He states that he would really like to go to rehab but is unwilling to give up his check because he has no other way to pay his bills. ED course: On arrival, he is afebrile, tachycardic at 100, BP 98/42, tachypneic at 22 with O2 sat 100% on 3 L.  Blood work significant for normal WBC.  Hemoglobin at baseline of 10.6 lactic acid elevated at 3.8.  Creatinine WNL BNP normal at 63 compared to 943 weeks prior.  Lipase 56, procalcitonin less than 0.10.  INR 1.3 . troponin flat at 24 and 23.  Venous pH 61 up from baseline around 50.  C. difficile antigen positive EKG as interpreted by me: A. fib at 101 Chest x-ray: Persistent bibasilar opacities.  Cardiomegaly and vascular congestion.  LE mild edema could present with similar appearance.  Patient was noted to have several episodes of watery stool while in the ER and was tested for C. difficile  which returned positive.  Hospitalist consulted for admission.    Review of Systems: As per HPI otherwise all other systems on review of systems negative.    Past Medical History:  Diagnosis Date  . Allergy   . Anxiety   . Arthritis   . Asthma   . Brain damage   . Chronic pain of both knees 07/13/2018  . Clotting disorder (HCC)   . COPD (chronic obstructive pulmonary disease) (HCC)   . Depression   . GERD (gastroesophageal reflux disease)   . HFrEF (heart failure with reduced ejection fraction) (HCC)    a. 03/2018 Echo: EF 25-30%, diff HK. Mod LAE.  [name] History of DVT (deep vein thrombosis)   . History of pulmonary embolism    a. Chronic coumadin.  [name] Hypertension   . MI (myocardial infarction) (HCC)   . Morbid obesity (HCC)   . Neck pain 07/13/2018  . NICM (nonischemic cardiomyopathy) (HCC)    a. s/p Cath x 3 - reportedly nl cors. Last cath 2019 in [place]; b. a. 03/2018 Echo: EF 25-30%, diff HK.  [name] Persistent atrial fibrillation (HCC)    a. 03/2018 s/p DCCV; b. CHA2DS2VASc = 1-->Xarelto (later changed to warfarin); c. 05/2018 recurrent afib-->Amio initiated.  . Sleep apnea   . Sleep apnea     Past Surgical History:  Procedure Laterality Date  . CARDIOVERSION N/A 03/24/2018   Procedure: CARDIOVERSION;  Surgeon: Antonieta Iba, MD;  Location: [facility] ORS;  Service: Cardiovascular;  Laterality: N/A;  .  CARDIOVERSION N/A 08/08/2018   Procedure: CARDIOVERSION;  Surgeon: [name], MD;  Location: [facility] ORS;  Service: Cardiovascular;  Laterality: N/A;  . hearnia repair     X 3- total of two surgeries  . HERNIA REPAIR    . LEG SURGERY       reports that he has never smoked. He has never used smokeless tobacco. He reports previous alcohol use. He reports that he does not use drugs.  No Known Allergies  Family History  Problem Relation Age of Onset  . Heart failure Mother   . Lung cancer Mother   . Lung cancer Father   . Heart attack Maternal Grandmother   . Heart attack  Maternal Grandfather       Prior to Admission medications   Medication Sig Start Date End Date Taking? Authorizing Provider  albuterol (VENTOLIN HFA) 108 (90 Base) MCG/ACT inhaler Inhale 2 puffs into the lungs every 4 (four) hours as needed for wheezing or shortness of breath. 04/28/20   Sreenath, Jonelle Sports, MD  apixaban (ELIQUIS) 5 MG TABS tablet Take 1 tablet (5 mg total) by mouth 2 (two) times daily. 04/28/20   Tresa Moore, MD  aspirin 81 MG chewable tablet Chew 1 tablet (81 mg total) by mouth daily. Patient not taking: Reported on 04/30/2020 02/03/20   Esaw Grandchild A, DO  atorvastatin (LIPITOR) 40 MG tablet TAKE 1 TABLET BY MOUTH ONCE DAILY Patient taking differently: Take 40 mg by mouth daily. 08/15/19   Flinchum, [name], FNP  cefadroxil (DURICEF) 500 MG capsule Take 1 capsule (500 mg total) by mouth 2 (two) times daily. 05/02/20   Danford, Earl Lites, MD  DULoxetine (CYMBALTA) 20 MG capsule Take 1 capsule (20 mg total) by mouth daily. 10/25/19   Karamalegos, Netta Neat, DO  levothyroxine (SYNTHROID) 75 MCG tablet Take 1 tablet (75 mcg total) by mouth daily before breakfast. 01/13/20   Enedina Finner, MD  lisinopril (ZESTRIL) 2.5 MG tablet Take by mouth. 03/06/19   [provider]  loperamide (IMODIUM) 2 MG capsule Take 1 capsule (2 mg total) by mouth as needed for diarrhea or loose stools. Patient not taking: Reported on 04/30/2020 06/26/19   Arnetha Courser, MD  magnesium oxide (MAG-OX) 400 (241.3 Mg) MG tablet Take 1 tablet (400 mg total) by mouth 2 (two) times daily. Patient not taking: Reported on 04/30/2020 01/12/20   Enedina Finner, MD  meclizine (ANTIVERT) 25 MG tablet Take 0.5-1 tablets (12.5-25 mg total) by mouth 3 (three) times daily as needed for dizziness. 02/26/20   Karamalegos, Netta Neat, DO  methocarbamol (ROBAXIN) 500 MG tablet Take 1 tablet (500 mg total) by mouth every 8 (eight) hours as needed for up to 7 days for muscle spasms. Patient not taking: No sig  reported 04/28/20 05/05/20  Lolita Patella B, MD  metoprolol succinate (TOPROL-XL) 100 MG 24 hr tablet Take 100 mg by mouth daily. 03/17/20   [provider]  metoprolol succinate (TOPROL-XL) 50 MG 24 hr tablet Take 2 tablets (100 mg total) by mouth daily. Take with or immediately following a meal. 01/12/20   Enedina Finner, MD  montelukast (SINGULAIR) 10 MG tablet Take 1 tablet (10 mg total) by mouth at bedtime. 10/12/19   Karamalegos, Netta Neat, DO  Multiple Vitamin (MULTIVITAMIN WITH MINERALS) TABS tablet Take 1 tablet by mouth daily.    [provider]  naphazoline-glycerin (CLEAR EYES REDNESS) 0.012-0.2 % SOLN Place 1-2 drops into both eyes 4 (four) times daily as needed for eye irritation.  08/14/19   Alford Highland, MD  nitroGLYCERIN (NITROSTAT) 0.4 MG SL tablet Place 1 tablet under tongue every 5 minutes as needed for chest pain. (No more than 3 doses within 15 minutes) 03/08/19   Flinchum, [name], FNP  oxyCODONE-acetaminophen (PERCOCET) 7.5-325 MG tablet Take 1 tablet by mouth every 6 (six) hours as needed for moderate pain. 05/02/20   Danford, Earl Lites, MD  potassium chloride SA (KLOR-CON) 20 MEQ tablet Take 1 tablet (20 mEq total) by mouth daily. 05/02/20   Danford, Earl Lites, MD  SUMAtriptan (IMITREX) 50 MG tablet Take 1 tablet (50 mg total) by mouth every 2 (two) hours as needed for migraine or headache. May repeat in 2 hours if headache persists or recurs. No more than 2 doses in a day Patient not taking: Reported on 04/30/2020 12/28/19   Lurene Shadow, MD  torsemide (DEMADEX) 20 MG tablet Take 4 tablets (80 mg total) by mouth 2 (two) times daily. Patient not taking: Reported on 04/30/2020 02/02/20   Esaw Grandchild A, DO  traZODone (DESYREL) 150 MG tablet Take 1 tablet (150 mg total) by mouth at bedtime. 10/25/19   [name], DO    Physical Exam: Vitals:   05/04/20 0111 05/04/20 0130 05/04/20 0345 05/04/20 0400  BP:  116/64 132/73 (!) 136/54   Pulse:  (!) 49 96 85  Resp:  20 20 16   Temp:      TempSrc:      SpO2:  100% 100% 100%  Weight: (!) 188.2 kg     Height:         Vitals:   05/04/20 0111 05/04/20 0130 05/04/20 0345 05/04/20 0400  BP:  116/64 132/73 (!) 136/54  Pulse:  (!) 49 96 85  Resp:  20 20 16   Temp:      TempSrc:      SpO2:  100% 100% 100%  Weight: (!) 188.2 kg     Height:          Constitutional: Alert and oriented x 3 .  Conversational dyspnea HEENT:      Head: Normocephalic and atraumatic.         Eyes: PERLA, EOMI, Conjunctivae are normal. Sclera is non-icteric.       Mouth/Throat: Mucous membranes are moist.       Neck: Supple with no signs of meningismus. Cardiovascular:  Tachycardic. No murmurs, gallops, or rubs. 2+ symmetrical distal pulses are present . No JVD.  2-3+ LE edema Respiratory: Respiratory effort increased with tachypnea and speaking in short sentences.Lungs sounds diminished bilaterally. No wheezes, crackles, or rhonchi.  Gastrointestinal: Soft, non tender, and non distended with positive bowel sounds.  Genitourinary: No CVA tenderness. Musculoskeletal: Nontender with normal range of motion in all extremities. No cyanosis, or erythema of extremities. Neurologic:  Face is symmetric. Moving all extremities. No gross focal neurologic deficits . Skin: Skin is warm, dry.  Venous stasis dermatitis Psychiatric: Depressed affect   Labs on Admission: I have personally reviewed following labs and imaging studies  CBC: Recent Labs  Lab 04/30/20 0958 05/01/20 0518 05/04/20 0137  WBC 8.5 4.7 4.9  NEUTROABS 7.0  --  2.4  HGB 10.3* 9.3* 10.6*  HCT 31.4* 28.4* 32.8*  MCV 96.6 99.3 99.4  PLT 151 120* 195   Basic Metabolic Panel: Recent Labs  Lab 04/30/20 0958 05/01/20 0518 05/04/20 0137  NA 135 136 132*  K 3.2* 3.4* 3.6  CL 88* 90* 88*  CO2 35* 36* 30  GLUCOSE 145* 206* 133*  BUN 26* 18 14  CREATININE 0.89 0.65 0.86  CALCIUM 8.7* 8.8* 8.6*  MG 1.7  --   --     GFR: Estimated Creatinine Clearance: 152.1 mL/min (by C-G formula based on SCr of 0.86 mg/dL). Liver Function Tests: Recent Labs  Lab 04/30/20 0958 05/04/20 0137  AST 25 27  ALT 41 32  ALKPHOS 64 59  BILITOT 0.9 0.7  PROT 6.3* 6.4*  ALBUMIN 3.1* 3.2*   Recent Labs  Lab 05/04/20 0137  LIPASE 59*   No results for input(s): AMMONIA in the last 168 hours. Coagulation Profile: Recent Labs  Lab 05/04/20 0256  INR 1.3*   Cardiac Enzymes: Recent Labs  Lab 04/30/20 0925  CKTOTAL 37*   BNP (last 3 results) No results for input(s): PROBNP in the last 8760 hours. HbA1C: No results for input(s): HGBA1C in the last 72 hours. CBG: Recent Labs  Lab 04/30/20 2129 05/01/20 0839 05/01/20 1311 05/01/20 1536 05/01/20 2042  GLUCAP 222* 207* 164* 204* 167*   Lipid Profile: No results for input(s): CHOL, HDL, LDLCALC, TRIG, CHOLHDL, LDLDIRECT in the last 72 hours. Thyroid Function Tests: No results for input(s): TSH, T4TOTAL, FREET4, T3FREE, THYROIDAB in the last 72 hours. Anemia Panel: No results for input(s): VITAMINB12, FOLATE, FERRITIN, TIBC, IRON, RETICCTPCT in the last 72 hours. Urine analysis:    Component Value Date/Time   COLORURINE YELLOW (A) 05/04/2020 0107   APPEARANCEUR HAZY (A) 05/04/2020 0107   APPEARANCEUR Hazy (A) 12/25/2018 1442   LABSPEC 1.015 05/04/2020 0107   PHURINE 5.0 05/04/2020 0107   GLUCOSEU NEGATIVE 05/04/2020 0107   HGBUR NEGATIVE 05/04/2020 0107   BILIRUBINUR NEGATIVE 05/04/2020 0107   BILIRUBINUR Negative 12/25/2018 1442   KETONESUR NEGATIVE 05/04/2020 0107   PROTEINUR NEGATIVE 05/04/2020 0107   NITRITE NEGATIVE 05/04/2020 0107   LEUKOCYTESUR TRACE (A) 05/04/2020 0107    Radiological Exams on Admission: DG Chest Port 1 View  Result Date: 05/04/2020 CLINICAL DATA:  Weakness EXAM: PORTABLE CHEST 1 VIEW COMPARISON:  Radiograph 04/13/2020 FINDINGS: Low lung volumes. Persistent bibasilar opacities are slightly the increased from  comparison. Could reflect regions of atelectasis or consolidation in the setting of suspected sepsis. No visible pleural effusion the portions of the right costophrenic sulci are collimated from view. No pneumothorax. Cardiomegaly is similar to prior. Mild pulmonary vascular congestion. No acute osseous or soft tissue abnormality. Telemetry leads overlie the chest. IMPRESSION: Persistent bibasilar opacities, slightly increased from comparison. Could reflect regions of atelectasis or consolidation in the setting of suspected sepsis. Cardiomegaly and vascular congestion. Early mild edema could present with a similar appearance as well. Electronically Signed   By: Kreg Shropshire M.D.   On: 05/04/2020 02:12     Assessment/Plan 60 year old male with history of class III obesity, BMI over 60, depression, HFrEF with last EF 30 to 35%, COPD, OSA chronic respiratory failure on home O2 at 3 L and on CPAP, A. fib and history of DVT on Eliquis, CAD recently hospitalized from 2/22 - 3/14 with complications of Covid pneumonia requiring intubation, rehospitalized from 3/16-3/18, presenting with shortness of breath, protracted weakness, diarrhea and inability to manage at home     Acute on chronic respiratory failure with hypoxia and hypercapnia (HCC) -Patient with dyspnea orthopnea and increase pedal edema with mild edema, normal BNP but mild edema on chest x-ray.  Increased work of breathing with venous PCO2 61 up from baseline of 50-55 though with normal pH -Etiology suspect related to mild heart failure   Clostridium difficile diarrhea Lactic  acidosis versus less probable sepsis -Patient noted to have frequent watery stools in the ER -C. difficile antigen positive -Oral vancomycin suspension ordered -Sepsis criteria soft but include tachycardia, tachypnea, hypotension, elevated lactic acid.  Patient was afebrile and with normal WBC and procalcitonin less than 0.1 -Hypotension on admission likely related to  relative dehydration from diarrhea -Continue to monitor for sepsis criteria -Gentle hydration as tolerated    Acute on chronic diastolic CHF (congestive heart failure) (HCC) -Very mild worsening at best given normal BNP and minimal findings on chest x-ray -Monitor for worsening in view of IV hydration given possible sepsis      Obstructive sleep apnea -Nighttime CPAP  Chronic anticoagulation Chronic atrial fibrillation (HCC) History of DVT -Holding rate control agents due to soft blood pressure -Continue Eliquis   Morbid obesity with BMI of 60.0-69.9, with physical deconditioning Obstructive sleep apnea Chronic venous stasis dermatitis both lower extremities Unable to care for self -Nighttime CPAP -All complicating factors to overall prognosis and care -Physical therapy evaluation    CAD (coronary artery disease) -Continue home meds    Hypothyroidism -Continue levothyroxine    Depression -Continue antidepressants  DVT prophylaxis: Eliquis code Status: full code  Family Communication:  none  Disposition Plan: Back to previous home environment Consults called: none  Status:At the time of admission, it appears that the appropriate admission status for this patient is INPATIENT. This is judged to be reasonable and necessary in order to provide the required intensity of service to ensure the patient's safety given the presenting symptoms, physical exam findings, and initial radiographic and laboratory data in the context of their  Comorbid conditions.   Patient requires inpatient status due to high intensity of service, high risk for further deterioration and high frequency of surveillance required.   I certify that at the point of admission it is my clinical judgment that the patient will require inpatient hospital care spanning beyond 2 midnights     Andris BaumannHazel V Duncan MD [facility]     05/04/2020, 5:15 AM

## 2020-05-04 NOTE — ED Notes (Signed)
CPAP transported to floor by Jonny Ruiz, RN.

## 2020-05-04 NOTE — ED Notes (Signed)
Patient updated on POC. Patient provided warm blankets.

## 2020-05-04 NOTE — ED Notes (Signed)
Respiratory notified of Cpap order. Patient reports improvement to headache.

## 2020-05-04 NOTE — ED Notes (Signed)
Patient transported upstairs to floor. Inpatient nurse aware that the patient needs CPAP for sleep.

## 2020-05-04 NOTE — ED Notes (Signed)
Report given to Marsha, RN

## 2020-05-04 NOTE — ED Notes (Signed)
Patient appears to be resting. Patient laying on his left side, with CPAP in place. Patient appears comfortable.

## 2020-05-04 NOTE — ED Notes (Signed)
Patient noted to have called on the call light. Patient has a myriad of complaints at RN arrival. Patient requesting repositioning in the bed, as well as more pain medication. Patient assisted with pericare. Patient fecal management system noted to have come out of patient's rectum. This RN suggested replacing the fecal management system, but the patient refused. Patient states "I'm done trying to keep that in, I don't want to try again." Brief and pad placed underneath patient. Patient CPAP removed, at patient request, as well as water provided. Patient also provided more warm blankets, and a television remote. Patient updated on delay with inpatient bed assignment. Patient verbalized understanding.

## 2020-05-04 NOTE — ED Notes (Signed)
Patient appears to be sleeping.

## 2020-05-04 NOTE — ED Notes (Addendum)
Patient updated on inpatient bed assignment, and delay related to inpatient RN shift change.

## 2020-05-04 NOTE — ED Notes (Signed)
Patient given full bed bath with max assist x4. Patient cleaned of incontinent urine and BM. Patient used bedpan and had large liquid stool.  Male external cath in place. mepilex placed, barrier cream applied to bottom and upper thighs. Patient states "I haven't had a bath in weeks".  Bandages and coban removed from bilateral calves. Redness and open sores noted to both calves.

## 2020-05-05 ENCOUNTER — Ambulatory Visit: Payer: Medicaid Other | Admitting: Family Medicine

## 2020-05-05 ENCOUNTER — Ambulatory Visit: Payer: Medicaid Other | Admitting: Family

## 2020-05-05 DIAGNOSIS — J9621 Acute and chronic respiratory failure with hypoxia: Secondary | ICD-10-CM | POA: Diagnosis not present

## 2020-05-05 DIAGNOSIS — J9622 Acute and chronic respiratory failure with hypercapnia: Secondary | ICD-10-CM | POA: Diagnosis not present

## 2020-05-05 LAB — BASIC METABOLIC PANEL
Anion gap: 7 (ref 5–15)
BUN: 16 mg/dL (ref 6–20)
CO2: 24 mmol/L (ref 22–32)
Calcium: 8.1 mg/dL — ABNORMAL LOW (ref 8.9–10.3)
Chloride: 102 mmol/L (ref 98–111)
Creatinine, Ser: 1.01 mg/dL (ref 0.61–1.24)
GFR, Estimated: >60 mL/min (ref >60)
Glucose, Bld: 107 mg/dL — ABNORMAL HIGH (ref 70–99)
Potassium: 4.1 mmol/L (ref 3.5–5.1)
Sodium: 133 mmol/L — ABNORMAL LOW (ref 135–145)

## 2020-05-05 LAB — CULTURE, BLOOD (ROUTINE X 2)
Culture: NO GROWTH
Culture: NO GROWTH
Special Requests: ADEQUATE
Special Requests: ADEQUATE

## 2020-05-05 LAB — CBC
HCT: 30.7 % — ABNORMAL LOW (ref 39.0–52.0)
Hemoglobin: 9.7 g/dL — ABNORMAL LOW (ref 13.0–17.0)
MCH: 31.8 pg (ref 26.0–34.0)
MCHC: 31.6 g/dL (ref 30.0–36.0)
MCV: 100.7 fL — ABNORMAL HIGH (ref 80.0–100.0)
Platelets: 165 10*3/uL (ref 150–400)
RBC: 3.05 MIL/uL — ABNORMAL LOW (ref 4.22–5.81)
RDW: 15.9 % — ABNORMAL HIGH (ref 11.5–15.5)
WBC: 3.8 10*3/uL — ABNORMAL LOW (ref 4.0–10.5)
nRBC: 0 % (ref 0.0–0.2)

## 2020-05-05 LAB — URINE CULTURE: Culture: <10000 — AB

## 2020-05-05 LAB — MAGNESIUM: Magnesium: 1.9 mg/dL (ref 1.7–2.4)

## 2020-05-05 LAB — LACTIC ACID, PLASMA: Lactic Acid, Venous: 0.6 mmol/L (ref 0.5–1.9)

## 2020-05-05 NOTE — TOC Progression Note (Signed)
Transition of Care [facility]) - Progression Note    Patient Details  Name: Nefi Musich MRN: [id number] Date of Birth: October 14, 1960  [facility]) CM/SW Contact  Maree Krabbe, LCSW Phone Number: 05/05/2020, 12:45 PM  Clinical Narrative:  Pt is stating he is now agreeable to SNF. Pt is requesting [place] area however, CSW explained placement may be difficult to find in this area. Pt does understand pt has to give up his Medicaid check. TOC director suggested CSW reach out to [facility]-- they are reviewing the referral.          Expected Discharge Plan and Services                                                 Social Determinants of Health (SDOH) Interventions    Readmission Risk Interventions Readmission Risk Prevention Plan 01/11/2020 12/24/2019 01/25/2019  Transportation Screening Complete Complete Complete  PCP or Specialist Appt within 3-5 Days - - -  HRI or Home Care Consult - - -  Social Work Consult for Recovery Care Planning/Counseling - - -  Palliative Care Screening - - -  Medication Review [facility]) Complete Complete Complete  PCP or Specialist appointment within 3-5 days of discharge Complete Complete Complete  HRI or Home Care Consult - - Complete  SW Recovery Care/Counseling Consult Complete Complete Complete  Palliative Care Screening Complete Not Applicable Not Applicable  Skilled Nursing Facility Not Applicable Not Applicable Not Applicable

## 2020-05-05 NOTE — Progress Notes (Signed)
PROGRESS NOTE    [name] [name]  FVC:[id number] DOB: 11/18/1960 DOA: 05/04/2020 PCP: [name], DO  [id number]   Assessment & Plan:   Principal Problem:   Acute on chronic respiratory failure with hypoxia and hypercapnia (HCC) Active Problems:   Acute on chronic diastolic CHF (congestive heart failure) (HCC)   Obstructive sleep apnea   Sepsis (HCC)   Chronic atrial fibrillation (HCC)   HTN (hypertension)   Morbid obesity with BMI of 60.0-69.9, adult (HCC)   CAD (coronary artery disease)   Hypothyroidism   Chronic venous stasis dermatitis of both lower extremities   Chronic anticoagulation (warfarin  COUMADIN)   COPD (chronic obstructive pulmonary disease) (HCC)   Dyspnea and respiratory abnormality   Clostridium difficile diarrhea   Elevated lactic acid level   Unable to care for self   [name] [name] is a 60 y.o. male with medical history significant for Class III obesity, BMI over 60, depression, HFrEF with last EF 30 to 35%, COPD, OSA chronic respiratory failure on home O2 at 3 L and on CPAP, A. fib and history of DVT on Eliquis, CAD recently hospitalized from 2/22 - 3/14 with complications of Covid pneumonia requiring intubation, rehospitalized from 3/16-3/18, who returns to the emergency room with complaints of protracted weakness, inability to manage at home.    Patient, who lives alone was apparently discharged on 3/18 after he declined skilled rehab placement.  He states that he would really like to go to rehab but is unwilling to give up his check because he has no other way to pay his bills.  Patient had called EMS multiple times since being DC from hospital on 3/18 needing assistance of moving to and from bed to chair and needing assistance with toilet and hygiene.  Patient is unable to care for self at home alone.   # Chronic respiratory failure with hypoxia and hypercapnia on 2-3L home O2 # COPD # OSA on CPAP nightly --pt has chronic dyspnea and  BLE edema.  BNP actually better than baseline on presentation.  PCO2 61 up from baseline of 50-55 though with normal pH, and could be due to missing his nightly CPAP. --on presentation, sating 100% on home 3L O2. Plan: --cont home 2-3L supplemental O2 --cont CPAP while sleeping  Diarrhea Possible Clostridium difficile  Adenovirus F40/41 gastroenteritis -Patient noted to have frequent watery stools in the ER, which started after presentation. -C. difficile antigen positive, and PCR positive.  No fever, no leukocytosis, but since having diarrhea, oral vanc started on presentation. --GI path returned positive for Adenovirus F40/41. Plan: --cont oral vanc for a 10-day course, per ID rec  Sepsis ruled out --No fever, no leukocytosis, procal neg.    Lactic acidosis, resolved --3.8 on admission.  Unclear etiology. --resolved with NS@50  for 10 hours  chronic diastolic CHF (exacerbation ruled out)  Chronic anticoagulation Chronic atrial fibrillation (HCC) History of DVT --cont home Toprol -Continue Eliquis   Morbid obesity with BMI of 60.0-69.9, with physical deconditioning Unable to care for self --disposition pending discussion with TOC --PT/OT while inpatient  Chronic venous stasis dermatitis both lower extremities --edema and erythema equal bilaterally, does not have infection.    CAD (coronary artery disease) -Continue home meds    Hypothyroidism -Continue levothyroxine    Depression -Continue antidepressants  Chronic MSK pain --pt asked for narcotics, however, currently does not have an outpatient narcotics Rx.  Plan: --No indication for narcotics pain medication currently --cont home Soma --Tylenol and Naproxen PRN for pain  DVT prophylaxis: KN:[id number] Code Status: Full code  Family Communication:  Level of care: Med-Surg Dispo:   The patient is from: home Anticipated d/c is to: undetermined Anticipated d/c date is: undetermined Patient currently is  not medically ready to d/c due to: unable to care for self at home, calling EMS repeatedly.  TOC working on long-term SNF placement with Medicaid.   Subjective and Interval History:  Pt complained of being stuck in bed, and wanted to get to the chair.  Complained of right shoulder pain.  Diarrhea has improved.   Objective: Vitals:   05/04/20 2310 05/05/20 0335 05/05/20 0500 05/05/20 1138  BP:  130/64  (!) 144/81  Pulse:  73  92  Resp:  (!) 22  20  Temp:  98.1 F (36.7 C)  97.6 F (36.4 C)  TempSrc:  Oral  Oral  SpO2: 96% 96%  100%  Weight:   (!) 165.1 kg   Height:        Intake/Output Summary (Last 24 hours) at 05/05/2020 1711 Last data filed at 05/05/2020 1200 Gross per 24 hour  Intake 360 ml  Output --  Net 360 ml   Filed Weights   05/04/20 0045 05/04/20 0111 05/05/20 0500  Weight: (!) 186 kg (!) 188.2 kg (!) 165.1 kg    Examination:   Constitutional: NAD, AAOx3 HEENT: conjunctivae and lids normal, EOMI CV: No cyanosis.   RESP: normal respiratory effort Neuro: II - XII grossly intact.   Psych: irritated mood and affect.     Data Reviewed: I have personally reviewed following labs and imaging studies  CBC: Recent Labs  Lab 04/30/20 0958 05/01/20 0518 05/04/20 0137 05/05/20 0445  WBC 8.5 4.7 4.9 3.8*  NEUTROABS 7.0  --  2.4  --   HGB 10.3* 9.3* 10.6* 9.7*  HCT 31.4* 28.4* 32.8* 30.7*  MCV 96.6 99.3 99.4 100.7*  PLT 151 120* 195 165   Basic Metabolic Panel: Recent Labs  Lab 04/30/20 0958 05/01/20 0518 05/04/20 0137 05/05/20 0336  NA 135 136 132* 133*  K 3.2* 3.4* 3.6 4.1  CL 88* 90* 88* 102  CO2 35* 36* 30 24  GLUCOSE 145* 206* 133* 107*  BUN 26* 18 14 16   CREATININE 0.89 0.65 0.86 1.01  CALCIUM 8.7* 8.8* 8.6* 8.1*  MG 1.7  --   --  1.9   GFR: Estimated Creatinine Clearance: 119.3 mL/min (by C-G formula based on SCr of 1.01 mg/dL). Liver Function Tests: Recent Labs  Lab 04/30/20 0958 05/04/20 0137  AST 25 27  ALT 41 32  ALKPHOS 64 59   BILITOT 0.9 0.7  PROT 6.3* 6.4*  ALBUMIN 3.1* 3.2*   Recent Labs  Lab 05/04/20 0137  LIPASE 59*   No results for input(s): AMMONIA in the last 168 hours. Coagulation Profile: Recent Labs  Lab 05/04/20 0256  INR 1.3*   Cardiac Enzymes: Recent Labs  Lab 04/30/20 0925  CKTOTAL 37*   BNP (last 3 results) No results for input(s): PROBNP in the last 8760 hours. HbA1C: No results for input(s): HGBA1C in the last 72 hours. CBG: Recent Labs  Lab 04/30/20 2129 05/01/20 0839 05/01/20 1311 05/01/20 1536 05/01/20 2042  GLUCAP 222* 207* 164* 204* 167*   Lipid Profile: No results for input(s): CHOL, HDL, LDLCALC, TRIG, CHOLHDL, LDLDIRECT in the last 72 hours. Thyroid Function Tests: No results for input(s): TSH, T4TOTAL, FREET4, T3FREE, THYROIDAB in the last 72 hours. Anemia Panel: No results for input(s): VITAMINB12, FOLATE, FERRITIN, TIBC, IRON, RETICCTPCT in  the last 72 hours. Sepsis Labs: Recent Labs  Lab 04/30/20 0958 05/04/20 0137 05/04/20 0256 05/05/20 0336  PROCALCITON  --  <0.10  --   --   LATICACIDVEN 1.5  --  3.8* 0.6    Recent Results (from the past 240 hour(s))  Urine Culture     Status: Abnormal   Collection Time: 04/30/20  8:07 AM   Specimen: Urine, Random  Result Value Ref Range Status   Specimen Description   Final    URINE, RANDOM Performed at [facility], [address] [place], [place] [address]    Special Requests   Final    NONE Performed at [facility], [address] [place], [place] [address]    Culture >=100,000 COLONIES/mL KLEBSIELLA PNEUMONIAE (A)  Final   Report Status 05/03/2020 FINAL  Final   Organism ID, Bacteria KLEBSIELLA PNEUMONIAE (A)  Final      Susceptibility   Klebsiella pneumoniae - MIC*    AMPICILLIN >=32 RESISTANT Resistant     CEFAZOLIN <=4 SENSITIVE Sensitive     CEFEPIME <=0.12 SENSITIVE Sensitive     CEFTRIAXONE <=0.25 SENSITIVE Sensitive     CIPROFLOXACIN <=0.25 SENSITIVE Sensitive      GENTAMICIN <=1 SENSITIVE Sensitive     IMIPENEM <=0.25 SENSITIVE Sensitive     NITROFURANTOIN <=16 SENSITIVE Sensitive     TRIMETH/SULFA <=20 SENSITIVE Sensitive     AMPICILLIN/SULBACTAM 4 SENSITIVE Sensitive     PIP/TAZO 8 SENSITIVE Sensitive     * >=100,000 COLONIES/mL KLEBSIELLA PNEUMONIAE  Blood culture (routine x 2)     Status: None   Collection Time: 04/30/20  9:59 AM   Specimen: BLOOD  Result Value Ref Range Status   Specimen Description BLOOD RIGHT [facility]  Final   Special Requests   Final    BOTTLES DRAWN AEROBIC AND ANAEROBIC Blood Culture adequate volume   Culture   Final    NO GROWTH 5 DAYS Performed at [facility], [address] [place], [place] [address]    Report Status 05/05/2020 FINAL  Final  Blood culture (routine x 2)     Status: None   Collection Time: 04/30/20 10:09 AM   Specimen: BLOOD  Result Value Ref Range Status   Specimen Description BLOOD RIGHT ASSIST CONTROL  Final   Special Requests   Final    BOTTLES DRAWN AEROBIC AND ANAEROBIC Blood Culture adequate volume   Culture   Final    NO GROWTH 5 DAYS Performed at [facility], [address] [place], [place] [address]    Report Status 05/05/2020 FINAL  Final  Urine culture     Status: Abnormal   Collection Time: 05/04/20 12:56 AM   Specimen: In/Out Cath Urine  Result Value Ref Range Status   Specimen Description   Final    IN/OUT CATH URINE Performed at [facility], [address] [place], [place] [address]    Special Requests   Final    NONE Performed at [facility], [address] [place], [place] [address]    Culture (A)  Final    <10,000 COLONIES/mL INSIGNIFICANT GROWTH Performed at [facility], [address] [place], [place] [address]    Report Status 05/05/2020 FINAL  Final  Blood culture (routine single)     Status: None (Preliminary result)   Collection Time: 05/04/20  1:37 AM   Specimen: BLOOD  Result Value Ref Range Status    Specimen Description BLOOD  RTAC  Final  Special Requests   Final    BOTTLES DRAWN AEROBIC ONLY Blood Culture results may not be optimal due to an inadequate volume of blood received in culture bottles   Culture   Final    NO GROWTH 1 DAY Performed at [facility], [address], [place], [place] [address]    Report Status PENDING  Incomplete  C Difficile Quick Screen w PCR reflex     Status: Abnormal   Collection Time: 05/04/20  3:24 AM   Specimen: STOOL  Result Value Ref Range Status   C Diff antigen POSITIVE (A) NEGATIVE Final   C Diff toxin NEGATIVE NEGATIVE Final   C Diff interpretation Results are indeterminate. See PCR results.  Final    Comment: Performed at [facility], [address] [place], [place] [address]  C. Diff by PCR, Reflexed     Status: Abnormal   Collection Time: 05/04/20  3:24 AM  Result Value Ref Range Status   Toxigenic C. Difficile by PCR POSITIVE (A) NEGATIVE Final    Comment: Performed at [facility], [address] [place], [place] [address]  Gastrointestinal Panel by PCR , Stool     Status: Abnormal   Collection Time: 05/04/20  3:33 AM   Specimen: STOOL  Result Value Ref Range Status   Campylobacter species NOT DETECTED NOT DETECTED Final   Plesimonas shigelloides NOT DETECTED NOT DETECTED Final   Salmonella species NOT DETECTED NOT DETECTED Final   Yersinia enterocolitica NOT DETECTED NOT DETECTED Final   Vibrio species NOT DETECTED NOT DETECTED Final   Vibrio cholerae NOT DETECTED NOT DETECTED Final   Enteroaggregative E coli (EAEC) NOT DETECTED NOT DETECTED Final   Enteropathogenic E coli (EPEC) NOT DETECTED NOT DETECTED Final   Enterotoxigenic E coli (ETEC) NOT DETECTED NOT DETECTED Final   Shiga like toxin producing E coli (STEC) NOT DETECTED NOT DETECTED Final   Shigella/Enteroinvasive E coli (EIEC) NOT DETECTED NOT DETECTED Final   Cryptosporidium NOT DETECTED NOT DETECTED Final   Cyclospora cayetanensis NOT  DETECTED NOT DETECTED Final   Entamoeba histolytica NOT DETECTED NOT DETECTED Final   Giardia lamblia NOT DETECTED NOT DETECTED Final   Adenovirus F40/41 DETECTED (A) NOT DETECTED Final   Astrovirus NOT DETECTED NOT DETECTED Final   Norovirus GI/GII NOT DETECTED NOT DETECTED Final   Rotavirus A NOT DETECTED NOT DETECTED Final   Sapovirus (I, II, IV, and V) NOT DETECTED NOT DETECTED Final    Comment: Performed at [facility], [address] [place], [place] [address]      Radiology Studies: DG Chest Port 1 View  Result Date: 05/04/2020 CLINICAL DATA:  Weakness EXAM: PORTABLE CHEST 1 VIEW COMPARISON:  Radiograph 04/13/2020 FINDINGS: Low lung volumes. Persistent bibasilar opacities are slightly the increased from comparison. Could reflect regions of atelectasis or consolidation in the setting of suspected sepsis. No visible pleural effusion the portions of the right costophrenic sulci are collimated from view. No pneumothorax. Cardiomegaly is similar to prior. Mild pulmonary vascular congestion. No acute osseous or soft tissue abnormality. Telemetry leads overlie the chest. IMPRESSION: Persistent bibasilar opacities, slightly increased from comparison. Could reflect regions of atelectasis or consolidation in the setting of suspected sepsis. Cardiomegaly and vascular congestion. Early mild edema could present with a similar appearance as well. Electronically Signed   By: Kreg Shropshire M.D.   On: 05/04/2020 02:12     Scheduled Meds: . apixaban  5 mg Oral BID  . atorvastatin  40 mg Oral  Daily  . carisoprodol  350 mg Oral TID  . dextromethorphan-guaiFENesin  1 tablet Oral BID  . DULoxetine  20 mg Oral Daily  . levothyroxine  75 mcg Oral Q0600  . metoprolol succinate  50 mg Oral Daily  . sodium chloride flush  3 mL Intravenous Q12H  . traZODone  150 mg Oral QHS  . vancomycin  125 mg Oral QID   Continuous Infusions: . sodium chloride       LOS: 1 day    Darlin Priestly, MD [facility] If 7PM-7AM, please contact night-coverage 05/05/2020, 5:11 PM
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
## 2020-05-05 NOTE — Plan of Care (Signed)
  Problem: Nutrition: Goal: Adequate nutrition will be maintained Outcome: Progressing   

## 2020-05-05 NOTE — Telephone Encounter (Signed)
I called and spoke with the pharmacist and requested that they discontinue the Gapapentin at the patient request. The prescription was discontinued while the patient was in the hospital for ineffectiveness

## 2020-05-05 NOTE — Telephone Encounter (Signed)
Gabapentin was discontinued in the hospital due to ineffective.  Saralyn Pilar, DO [facility] 05/05/2020, 11:53 AM

## 2020-05-06 ENCOUNTER — Ambulatory Visit: Payer: Self-pay | Admitting: Licensed Clinical Social Worker

## 2020-05-06 ENCOUNTER — Encounter: Payer: Medicaid Other | Admitting: Occupational Therapy

## 2020-05-06 DIAGNOSIS — S46011A Strain of muscle(s) and tendon(s) of the rotator cuff of right shoulder, initial encounter: Secondary | ICD-10-CM | POA: Diagnosis not present

## 2020-05-06 DIAGNOSIS — J9622 Acute and chronic respiratory failure with hypercapnia: Secondary | ICD-10-CM | POA: Diagnosis not present

## 2020-05-06 DIAGNOSIS — J9621 Acute and chronic respiratory failure with hypoxia: Secondary | ICD-10-CM | POA: Diagnosis not present

## 2020-05-06 LAB — CBC
HCT: 31.2 % — ABNORMAL LOW (ref 39.0–52.0)
Hemoglobin: 10.2 g/dL — ABNORMAL LOW (ref 13.0–17.0)
MCH: 32.7 pg (ref 26.0–34.0)
MCHC: 32.7 g/dL (ref 30.0–36.0)
MCV: 100 fL (ref 80.0–100.0)
Platelets: 180 10*3/uL (ref 150–400)
RBC: 3.12 MIL/uL — ABNORMAL LOW (ref 4.22–5.81)
RDW: 15.8 % — ABNORMAL HIGH (ref 11.5–15.5)
WBC: 3.6 10*3/uL — ABNORMAL LOW (ref 4.0–10.5)
nRBC: 0 % (ref 0.0–0.2)

## 2020-05-06 LAB — BASIC METABOLIC PANEL
Anion gap: 6 (ref 5–15)
BUN: 10 mg/dL (ref 6–20)
CO2: 37 mmol/L — ABNORMAL HIGH (ref 22–32)
Calcium: 9 mg/dL (ref 8.9–10.3)
Chloride: 94 mmol/L — ABNORMAL LOW (ref 98–111)
Creatinine, Ser: 0.66 mg/dL (ref 0.61–1.24)
GFR, Estimated: >60 mL/min (ref >60)
Glucose, Bld: 137 mg/dL — ABNORMAL HIGH (ref 70–99)
Potassium: 4.1 mmol/L (ref 3.5–5.1)
Sodium: 137 mmol/L (ref 135–145)

## 2020-05-06 LAB — MAGNESIUM: Magnesium: 1.6 mg/dL — ABNORMAL LOW (ref 1.7–2.4)

## 2020-05-06 MED ORDER — ACETAMINOPHEN 500 MG PO TABS
1000.0000 mg | ORAL_TABLET | Freq: Three times a day (TID) | ORAL | Status: DC
  Administered 2020-05-06 – 2020-05-09 (×9): 1000 mg via ORAL
  Filled 2020-05-06 (×11): qty 2

## 2020-05-06 MED ORDER — MAGNESIUM SULFATE 2 GM/50ML IV SOLN
2.0000 g | INTRAVENOUS | Status: AC
  Administered 2020-05-06 (×2): 2 g via INTRAVENOUS
  Filled 2020-05-06 (×2): qty 50

## 2020-05-06 MED ORDER — BUPIVACAINE HCL (PF) 0.5 % IJ SOLN
10.0000 mL | Freq: Once | INTRAMUSCULAR | Status: DC
  Filled 2020-05-06: qty 10

## 2020-05-06 MED ORDER — TRIAMCINOLONE ACETONIDE 40 MG/ML IJ SUSP
40.0000 mg | Freq: Once | INTRAMUSCULAR | Status: DC
  Filled 2020-05-06: qty 1

## 2020-05-06 NOTE — TOC Progression Note (Signed)
Transition of Care [facility]) - Progression Note    Patient Details  Name: [name] [name] MRN: [id number] Date of Birth: [birth year]  Transition of Care [facility]) CM/SW Contact  Chapman Fitch, RN Phone Number: 05/06/2020, 3:28 PM  Clinical Narrative:    Bed search extended         Expected Discharge Plan and Services                                                 Social Determinants of Health (SDOH) Interventions    Readmission Risk Interventions Readmission Risk Prevention Plan 01/11/2020 12/24/2019 01/25/2019  Transportation Screening Complete Complete Complete  PCP or Specialist Appt within 3-5 Days - - -  HRI or Home Care Consult - - -  Social Work Consult for Recovery Care Planning/Counseling - - -  Palliative Care Screening - - -  Medication Review (RN Care Manager) Complete Complete Complete  PCP or Specialist appointment within 3-5 days of discharge Complete Complete Complete  HRI or Home Care Consult - - Complete  SW Recovery Care/Counseling Consult Complete Complete Complete  Palliative Care Screening Complete Not Applicable Not Applicable  Skilled Nursing Facility Not Applicable Not Applicable Not Applicable

## 2020-05-06 NOTE — Procedures (Signed)
After obtaining informed consent from patient the posterior aspect of the shoulder was prepped first with alcohol and chlorhexidine and Betadine swabs 21-gauge needle was inserted in the subacromial space and 40 mg Kenalog 3 cc of Marcaine half percent plain were injected with needle withdrawn and Band-Aid applied patient tolerated procedure well.
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
## 2020-05-06 NOTE — Evaluation (Signed)
Physical Therapy Evaluation Patient Details Name: [name] [name] MRN: [id number] DOB: 1960/11/26 Today's Date: 05/06/2020   History of Present Illness  60 y.o. male with medical history significant for Class III obesity, BMI over 60, depression, HFrEF with last EF 30 to 35%, COPD, OSA chronic respiratory failure on home O2 at 3 L and on CPAP, A. fib and history of DVT on Eliquis, CAD recently hospitalized from 2/22 - 3/14, then 3/ with complications of Covid pneumonia requiring intubation, rehospitalized from 3/16-3/18, who returns to the emergency room with complaints of protracted weakness, inability to manage at home as well as shortness of breath and orthopnea and increased lower extremity edema.  He denies chest pain, cough fever or chills.  He also has diarrhea but denies abdominal pain, nausea or vomiting.  Patient, who lives alone was apparently discharged on 3/18 after he declined skilled rehab placement.  He states that he would really like to go to rehab but financially this is difficult 2/2 to having to give up his check because he has no other way to pay his bills.  Clinical Impression      Follow Up Recommendations SNF    Equipment Recommendations  None recommended by PT    Recommendations for Other Services       Precautions / Restrictions Precautions Precautions: Fall Precaution Comments: Watch Rt shoulder Restrictions Weight Bearing Restrictions: No      Mobility  Bed Mobility               General bed mobility comments: Pt seated in recliner chair upon entering the room, reports that he was able to get to EOB w/o help    Transfers Overall transfer level: Needs assistance Equipment used: Rolling walker (2 wheeled) Transfers: Sit to/from Stand Sit to Stand: Min guard         General transfer comment: Pt needing reminders to insure appropriate UE use/placement, but able to rise and maintain balance with walker with safely and w/o excessive  effort  Ambulation/Gait Ambulation/Gait assistance: Min guard Gait Distance (Feet): 8 Feet Assistive device: Rolling walker (2 wheeled) Gait Pattern/deviations: Step-to pattern     General Gait Details: Pt was able to take a few steps forward and back  X2 with no LOBs and c/o moderate fatigue.  HR rose from ~100 bpm at rest to ~130 with the effort, O2 remained in the 90s on 3L.  Stairs            Wheelchair Mobility    Modified Rankin (Stroke Patients Only)       Balance Overall balance assessment: Needs assistance Sitting-balance support: Feet supported Sitting balance-Leahy Scale: Good Sitting balance - Comments: Pt able to sit EOB recliner chair unsupported   Standing balance support: Bilateral upper extremity supported Standing balance-Leahy Scale: Fair Standing balance comment: forward lean on the walker, able to maintain standing realtively confidently for limited amount of time, quick to fatigue and feel dizzy in standing                             Pertinent Vitals/Pain Pain Assessment: 0-10 Pain Score: 5  Pain Location: c/o headache and dizziness t/o most of session, mild but persistent Pain Descriptors / Indicators: Aching;Discomfort;Headache Pain Intervention(s): Limited activity within patient's tolerance;Monitored during session;Patient requesting pain meds-RN notified;RN gave pain meds during session    Home Living Family/patient expects to be discharged to:: Private residence Living Arrangements: Non-relatives/Friends Available Help at Discharge: Personal  care attendant;Available PRN/intermittently (paid help 3d/wk) Type of Home: House Home Access: Ramped entrance     Home Layout: One level Home Equipment: Walker - 4 wheels;Shower seat;Hand held shower head;Wheelchair - power Additional Comments: sleeps in regular bed, lift chair    Prior Function Level of Independence: Needs assistance   Gait / Transfers Assistance Needed: Pt was  using bari rollator previously for transfers and scooter for fxl mobility, but decreased tolerance with several recent hospital admissions.  ADL's / Homemaking Assistance Needed: Pt reports having an aide that comes 3x week to assist with self care tasks such as shower as well as IADLs  Comments: Pt reports having transportation for appointments     Hand Dominance   Dominant Hand: Right    Extremity/Trunk Assessment   Upper Extremity Assessment Upper Extremity Assessment: Generalized weakness;RUE deficits/detail RUE Deficits / Details:  (lacks shoulder elevation > 90 b/l, grossly functional in limited range, L worse than R)    Lower Extremity Assessment Lower Extremity Assessment: Generalized weakness RLE Deficits / Details: minimal R hip flexion (pain) - grossly 3/5 on remainder of R LE exercises LLE Deficits / Details: grossly 4-/5 t/o       Communication   Communication: No difficulties  Cognition Arousal/Alertness: Awake/alert Behavior During Therapy: WFL for tasks assessed/performed Overall Cognitive Status: Within Functional Limits for tasks assessed                                 General Comments: Pt is A and O x 4      General Comments      Exercises     Assessment/Plan    PT Assessment Patient needs continued PT services  PT Problem List Decreased strength;Decreased mobility;Decreased safety awareness;Decreased range of motion;Decreased knowledge of precautions;Decreased activity tolerance;Decreased balance;Decreased knowledge of use of DME;Cardiopulmonary status limiting activity;Obesity       PT Treatment Interventions DME instruction;Therapeutic exercise;Gait training;Balance training;Neuromuscular re-education;Functional mobility training;Therapeutic activities;Patient/family education    PT Goals (Current goals can be found in the Care Plan section)  Acute Rehab PT Goals Patient Stated Goal: To get better and stronger PT Goal  Formulation: With patient    Frequency Min 2X/week   Barriers to discharge        Co-evaluation               AM-PAC PT "6 Clicks" Mobility  Outcome Measure Help needed turning from your back to your side while in a flat bed without using bedrails?: A Little Help needed moving from lying on your back to sitting on the side of a flat bed without using bedrails?: A Little Help needed moving to and from a bed to a chair (including a wheelchair)?: A Little Help needed standing up from a chair using your arms (e.g., wheelchair or bedside chair)?: A Little Help needed to walk in hospital room?: A Lot Help needed climbing 3-5 steps with a railing? : Total 6 Click Score: 15    End of Session Equipment Utilized During Treatment: Oxygen Activity Tolerance: Patient limited by pain;Patient limited by fatigue Patient left: with call bell/phone within reach;with chair alarm set   PT Visit Diagnosis: Other abnormalities of gait and mobility (R26.89);Muscle weakness (generalized) (M62.81);Difficulty in walking, not elsewhere classified (R26.2);Unsteadiness on feet (R26.81)    Time: 1030-1103 PT Time Calculation (min) (ACUTE ONLY): 33 min   Charges:   PT Evaluation $PT Eval Low Complexity: 1 Low PT Treatments $  Therapeutic Activity: 8-22 mins        Malachi Pro, DPT 05/06/2020, 1:52 PM

## 2020-05-06 NOTE — Evaluation (Signed)
Occupational Therapy Evaluation Patient Details Name: [name] [name] MRN: [id number] DOB: 1960/07/26 Today's Date: 05/06/2020    History of Present Illness 60 y.o. male with medical history significant for Class III obesity, BMI over 60, depression, HFrEF with last EF 30 to 35%, COPD, OSA chronic respiratory failure on home O2 at 3 L and on CPAP, A. fib and history of DVT on Eliquis, CAD recently hospitalized from 2/22 - 3/14 with complications of Covid pneumonia requiring intubation, rehospitalized from 3/16-3/18, who returns to the emergency room with complaints of protracted weakness, inability to manage at home as well as shortness of breath and orthopnea and increased lower extremity edema.  He denies chest pain, cough fever or chills.  He also has diarrhea but denies abdominal pain, nausea or vomiting.  Patient, who lives alone was apparently discharged on 3/18 after he declined skilled rehab placement.  He states that he would really like to go to rehab but is unwilling to give up his check because he has no other way to pay his bills.   Clinical Impression   Patient presenting with decreased I in self care, balance, functional mobility/transfer, endurance, and safety awareness. Patient reports living at home with an aide to provide assistance for self car tasks PTA. Pt recently discharged and then returning back to the hospital after not being able to care for self. Pt reports headache this session and therapist turning down lights for pain management. RN present to give medication as well.  Patient currently functioning at min guard to stand from recliner chair and taking 3 steps forwards and then back. HR increased to 152 bpm and pt reports not feeling well. Pt returning to recliner chair and requesting additional medication from RN. Pt needing total A to don/doff socks this session secondary to body habitus. Pt reports having bidet at home to assist with toilet hygiene. Pt is agreeable to short  term rehab stay to address functional deficits before returning home.  Patient will benefit from acute OT to increase overall independence in the areas of ADLs, functional mobility, and safety awareness in order to safely discharge to next venue of care.    Follow Up Recommendations  SNF    Equipment Recommendations  Other (comment) (defer to next venue of care)       Precautions / Restrictions Precautions Precautions: Fall      Mobility Bed Mobility               General bed mobility comments: Pt seated in recliner chair upon entering the room    Transfers Overall transfer level: Needs assistance Equipment used: Rolling walker (2 wheeled) Transfers: Sit to/from Stand Sit to Stand: Min guard         General transfer comment: CGA for safety to stand from recliner chair    Balance Overall balance assessment: History of Falls;Mild deficits observed, not formally tested Sitting-balance support: Feet supported Sitting balance-Leahy Scale: Good Sitting balance - Comments: Pt able to sit EOB recliner chair unsupported                                   ADL either performed or assessed with clinical judgement   ADL Overall ADL's : Needs assistance/impaired Eating/Feeding: Independent   Grooming: Wash/dry face;Sitting;Set up;Wash/dry hands       Lower Body Bathing: Total assistance  Toileting - Clothing Manipulation Details (indicate cue type and reason): Pt reports having a bidet at home but in this setting needs assistance for clothing management and hygiene max - total A             Vision Patient Visual Report: No change from baseline              Pertinent Vitals/Pain Pain Assessment: 0-10 Pain Score: 5  Pain Location: Pt reports generalized pain "all over" and also endorsed headache Pain Descriptors / Indicators: Aching;Discomfort;Headache Pain Intervention(s): Limited activity within patient's  tolerance;Monitored during session;Patient requesting pain meds-RN notified;RN gave pain meds during session     Hand Dominance Right   Extremity/Trunk Assessment Upper Extremity Assessment Upper Extremity Assessment: Generalized weakness;RUE deficits/detail RUE Deficits / Details: Pt reports "I got a rotator cuff tear" and is limited for shoulder elevation.   Lower Extremity Assessment Lower Extremity Assessment: Defer to PT evaluation       Communication Communication Communication: No difficulties   Cognition Arousal/Alertness: Awake/alert Behavior During Therapy: WFL for tasks assessed/performed Overall Cognitive Status: Within Functional Limits for tasks assessed                                 General Comments: Pt is A and O x 4              Home Living Family/patient expects to be discharged to:: Private residence Living Arrangements: Non-relatives/Friends Available Help at Discharge: Personal care attendant;Available PRN/intermittently Type of Home: House Home Access: Ramped entrance     Home Layout: One level     Bathroom Shower/Tub: Chief Strategy Officer: Standard     Home Equipment: Environmental consultant - 4 wheels;Shower seat;Hand held shower head;Wheelchair - power   Additional Comments: sleeps in regular bed, lift chair      Prior Functioning/Environment Level of Independence: Needs assistance  Gait / Transfers Assistance Needed: Pt was using bari rollator previously for transfers and scooter for fxl mobility, but decreased tolerance with several recent hospital admissions. ADL's / Homemaking Assistance Needed: Pt reports having an aide that comes 3x week to assist with self care tasks such as shower as well as IADLs   Comments: Pt reports having transportation for appointments        OT Problem List: Decreased safety awareness;Impaired balance (sitting and/or standing);Decreased activity tolerance;Cardiopulmonary status limiting  activity;Decreased knowledge of precautions;Decreased range of motion;Decreased strength;Decreased knowledge of use of DME or AE;Increased edema;Obesity      OT Treatment/Interventions: Self-care/ADL training;Therapeutic exercise;Patient/family education;Balance training;Therapeutic activities;DME and/or AE instruction    OT Goals(Current goals can be found in the care plan section) Acute Rehab OT Goals Patient Stated Goal: To get better and stronger OT Goal Formulation: With patient Time For Goal Achievement: 05/20/20 Potential to Achieve Goals: Fair  OT Frequency: Min 1X/week   Barriers to D/C: Decreased caregiver support             AM-PAC OT "6 Clicks" Daily Activity     Outcome Measure Help from another person eating meals?: None   Help from another person toileting, which includes using toliet, bedpan, or urinal?: Total Help from another person bathing (including washing, rinsing, drying)?: A Lot Help from another person to put on and taking off regular upper body clothing?: A Lot Help from another person to put on and taking off regular lower body clothing?: A Lot 6 Click Score: 11  End of Session Equipment Utilized During Treatment: Engineer, water Communication: Mobility status  Activity Tolerance: Patient limited by fatigue Patient left: in chair;with call bell/phone within reach  OT Visit Diagnosis: Unsteadiness on feet (R26.81);Repeated falls (R29.6);Muscle weakness (generalized) (M62.81)                Time: 1610-9604 OT Time Calculation (min): 21 min Charges:  OT General Charges $OT Visit: 1 Visit OT Evaluation $OT Eval Moderate Complexity: 1 Mod OT Treatments $Therapeutic Activity: 8-22 mins  Jackquline Denmark, MS, OTR/L , CBIS ascom [phone]  05/06/20, 2:46 PM

## 2020-05-06 NOTE — Progress Notes (Signed)
PROGRESS NOTE    [name] [name]  QJJ:[id number] DOB: November 30, 1960 DOA: 05/04/2020 PCP: [name], DO  [id number]   Assessment & Plan:   Principal Problem:   Acute on chronic respiratory failure with hypoxia and hypercapnia (HCC) Active Problems:   Acute on chronic diastolic CHF (congestive heart failure) (HCC)   Obstructive sleep apnea   Sepsis (HCC)   Chronic atrial fibrillation (HCC)   HTN (hypertension)   Morbid obesity with BMI of 60.0-69.9, adult (HCC)   CAD (coronary artery disease)   Hypothyroidism   Chronic venous stasis dermatitis of both lower extremities   Chronic anticoagulation (warfarin  COUMADIN)   COPD (chronic obstructive pulmonary disease) (HCC)   Dyspnea and respiratory abnormality   Clostridium difficile diarrhea   Elevated lactic acid level   Unable to care for self   [name] [name] is a 60 y.o. male with medical history significant for Class III obesity, BMI over 60, depression, HFrEF with last EF 30 to 35%, COPD, OSA chronic respiratory failure on home O2 at 3 L and on CPAP, A. fib and history of DVT on Eliquis, CAD recently hospitalized from 2/22 - 3/14 with complications of Covid pneumonia requiring intubation, rehospitalized from 3/16-3/18, who returns to the emergency room with complaints of protracted weakness, inability to manage at home.    Patient, who lives alone was apparently discharged on 3/18 after he declined skilled rehab placement.  He states that he would really like to go to rehab but is unwilling to give up his check because he has no other way to pay his bills.  Patient had called EMS multiple times since being DC from hospital on 3/18 needing assistance of moving to and from bed to chair and needing assistance with toilet and hygiene.  Patient is unable to care for self at home alone.   # Chronic respiratory failure with hypoxia and hypercapnia on 2-3L home O2 # COPD # OSA on CPAP nightly --pt has chronic dyspnea and  BLE edema.  BNP actually better than baseline on presentation.  PCO2 61 up from baseline of 50-55 though with normal pH, and could be due to missing his nightly CPAP. --on presentation, sating 100% on home 3L O2. Plan: --cont home 2-3L supplemental O2 --cont CPAP while sleeping  Diarrhea, improved Possible Clostridium difficile  Adenovirus F40/41 gastroenteritis -Patient noted to have frequent watery stools in the ER, which started after presentation. -C. difficile antigen positive, and PCR positive.  No fever, no leukocytosis, but since having diarrhea, oral vanc started on presentation.  Diarrhea has since improved. --GI path returned positive for Adenovirus F40/41. Plan: --cont oral vanc for a 10-day course, per ID rec  Sepsis ruled out --No fever, no leukocytosis, procal neg.    Lactic acidosis, resolved --3.8 on admission.  Unclear etiology. --resolved with NS@50  for 10 hours  chronic diastolic CHF (exacerbation ruled out)  Chronic anticoagulation Chronic atrial fibrillation (HCC) History of DVT --cont home Toprol -Continue Eliquis   Morbid obesity with BMI of 60.0-69.9, with physical deconditioning Unable to care for self --TOC to work on placement to SNF --PT/OT while inpatient --requested air mattress to off-load pressure  Chronic venous stasis dermatitis both lower extremities --edema and erythema equal bilaterally, does not have infection.    CAD (coronary artery disease) -Continue home statin    Hypothyroidism -Continue levothyroxine    Depression -Continue home Cymbalta and trazodone  Chronic MSK pain --pt asked for narcotics, however, currently does not have an outpatient narcotics Rx.  Plan: --No indication for narcotics pain medication currently --cont home Soma --Tylenol 1g TID scheduled for 5 days --Naproxen PRN for pain  Right shoulder pain, POA Probable rotator cuff strain --started a couple of week ago while pt was trying to pull  himself up in bed.  Xray no acute finding.  MRI shoulder could not be performed due to him not fitting in the MRI machine. Plan: --ortho consult today for possible shoulder injection, per pt request   DVT prophylaxis: WK:[id number] Code Status: Full code  Family Communication:  Level of care: Med-Surg Dispo:   The patient is from: home Anticipated d/c is to: SNF Anticipated d/c date is: undetermined Patient currently is not medically ready to d/c due to: unable to care for self at home, calling EMS repeatedly.  TOC working on long-term SNF placement with Medicaid.   Subjective and Interval History:  Complained of right shoulder pain and hip pain.     Objective: Vitals:   05/06/20 0358 05/06/20 1004 05/06/20 1300 05/06/20 1544  BP: 117/64 (!) 157/110 109/63 121/85  Pulse: 91 91 90 89  Resp: 16  18 20   Temp: (!) 97.5 F (36.4 C) 97.8 F (36.6 C) 97.6 F (36.4 C) 97.9 F (36.6 C)  TempSrc: Oral Oral Oral Oral  SpO2: 100% 97% 100% 98%  Weight: (!) 185.4 kg     Height:        Intake/Output Summary (Last 24 hours) at 05/06/2020 1559 Last data filed at 05/06/2020 1514 Gross per 24 hour  Intake 886.04 ml  Output 445 ml  Net 441.04 ml   Filed Weights   05/04/20 0111 05/05/20 0500 05/06/20 0358  Weight: (!) 188.2 kg (!) 165.1 kg (!) 185.4 kg    Examination:   Constitutional: NAD, AAOx3, sitting in recliner  HEENT: conjunctivae and lids normal, EOMI CV: No cyanosis.   RESP: normal respiratory effort Extremities: chronic tense edema in BLE SKIN: warm, dry Neuro: II - XII grossly intact.     Data Reviewed: I have personally reviewed following labs and imaging studies  CBC: Recent Labs  Lab 04/30/20 0958 05/01/20 0518 05/04/20 0137 05/05/20 0445 05/06/20 0625  WBC 8.5 4.7 4.9 3.8* 3.6*  NEUTROABS 7.0  --  2.4  --   --   HGB 10.3* 9.3* 10.6* 9.7* 10.2*  HCT 31.4* 28.4* 32.8* 30.7* 31.2*  MCV 96.6 99.3 99.4 100.7* 100.0  PLT 151 120* 195 165 180   Basic  Metabolic Panel: Recent Labs  Lab 04/30/20 0958 05/01/20 0518 05/04/20 0137 05/05/20 0336 05/06/20 0625  NA 135 136 132* 133* 137  K 3.2* 3.4* 3.6 4.1 4.1  CL 88* 90* 88* 102 94*  CO2 35* 36* 30 24 37*  GLUCOSE 145* 206* 133* 107* 137*  BUN 26* 18 14 16 10   CREATININE 0.89 0.65 0.86 1.01 0.66  CALCIUM 8.7* 8.8* 8.6* 8.1* 9.0  MG 1.7  --   --  1.9 1.6*   GFR: Estimated Creatinine Clearance: 162 mL/min (by C-G formula based on SCr of 0.66 mg/dL). Liver Function Tests: Recent Labs  Lab 04/30/20 0958 05/04/20 0137  AST 25 27  ALT 41 32  ALKPHOS 64 59  BILITOT 0.9 0.7  PROT 6.3* 6.4*  ALBUMIN 3.1* 3.2*   Recent Labs  Lab 05/04/20 0137  LIPASE 59*   No results for input(s): AMMONIA in the last 168 hours. Coagulation Profile: Recent Labs  Lab 05/04/20 0256  INR 1.3*   Cardiac Enzymes: Recent Labs  Lab 04/30/20 [phone]  CKTOTAL 37*   BNP (last 3 results) No results for input(s): PROBNP in the last 8760 hours. HbA1C: No results for input(s): HGBA1C in the last 72 hours. CBG: Recent Labs  Lab 04/30/20 2129 05/01/20 0839 05/01/20 1311 05/01/20 1536 05/01/20 2042  GLUCAP 222* 207* 164* 204* 167*   Lipid Profile: No results for input(s): CHOL, HDL, LDLCALC, TRIG, CHOLHDL, LDLDIRECT in the last 72 hours. Thyroid Function Tests: No results for input(s): TSH, T4TOTAL, FREET4, T3FREE, THYROIDAB in the last 72 hours. Anemia Panel: No results for input(s): VITAMINB12, FOLATE, FERRITIN, TIBC, IRON, RETICCTPCT in the last 72 hours. Sepsis Labs: Recent Labs  Lab 04/30/20 0958 05/04/20 0137 05/04/20 0256 05/05/20 0336  PROCALCITON  --  <0.10  --   --   LATICACIDVEN 1.5  --  3.8* 0.6    Recent Results (from the past 240 hour(s))  Urine Culture     Status: Abnormal   Collection Time: 04/30/20  8:07 AM   Specimen: Urine, Random  Result Value Ref Range Status   Specimen Description   Final    URINE, RANDOM Performed at [facility], [address], [place], [place] [address]    Special Requests   Final    NONE Performed at [facility], [address] [place], [place] [address]    Culture >=100,000 COLONIES/mL KLEBSIELLA PNEUMONIAE (A)  Final   Report Status 05/03/2020 FINAL  Final   Organism ID, Bacteria KLEBSIELLA PNEUMONIAE (A)  Final      Susceptibility   Klebsiella pneumoniae - MIC*    AMPICILLIN >=32 RESISTANT Resistant     CEFAZOLIN <=4 SENSITIVE Sensitive     CEFEPIME <=0.12 SENSITIVE Sensitive     CEFTRIAXONE <=0.25 SENSITIVE Sensitive     CIPROFLOXACIN <=0.25 SENSITIVE Sensitive     GENTAMICIN <=1 SENSITIVE Sensitive     IMIPENEM <=0.25 SENSITIVE Sensitive     NITROFURANTOIN <=16 SENSITIVE Sensitive     TRIMETH/SULFA <=20 SENSITIVE Sensitive     AMPICILLIN/SULBACTAM 4 SENSITIVE Sensitive     PIP/TAZO 8 SENSITIVE Sensitive     * >=100,000 COLONIES/mL KLEBSIELLA PNEUMONIAE  Blood culture (routine x 2)     Status: None   Collection Time: 04/30/20  9:59 AM   Specimen: BLOOD  Result Value Ref Range Status   Specimen Description BLOOD RIGHT [facility]  Final   Special Requests   Final    BOTTLES DRAWN AEROBIC AND ANAEROBIC Blood Culture adequate volume   Culture   Final    NO GROWTH 5 DAYS Performed at [facility], [address], [place], [place] [address]    Report Status 05/05/2020 FINAL  Final  Blood culture (routine x 2)     Status: None   Collection Time: 04/30/20 10:09 AM   Specimen: BLOOD  Result Value Ref Range Status   Specimen Description BLOOD RIGHT ASSIST CONTROL  Final   Special Requests   Final    BOTTLES DRAWN AEROBIC AND ANAEROBIC Blood Culture adequate volume   Culture   Final    NO GROWTH 5 DAYS Performed at [facility], [address] [place], [place] [address]    Report Status 05/05/2020 FINAL  Final  Urine culture     Status: Abnormal   Collection Time: 05/04/20 12:56 AM   Specimen: In/Out Cath Urine  Result Value Ref Range Status   Specimen Description    Final    IN/OUT CATH URINE Performed at [facility], [address] [place], [place] [address]  Special Requests   Final    NONE Performed at [facility], [address] [place], [place] [address]    Culture (A)  Final    <10,000 COLONIES/mL INSIGNIFICANT GROWTH Performed at [facility], [address] [place], [place] [address]    Report Status 05/05/2020 FINAL  Final  Blood culture (routine single)     Status: None (Preliminary result)   Collection Time: 05/04/20  1:37 AM   Specimen: BLOOD  Result Value Ref Range Status   Specimen Description BLOOD  RTAC  Final   Special Requests   Final    BOTTLES DRAWN AEROBIC ONLY Blood Culture results may not be optimal due to an inadequate volume of blood received in culture bottles   Culture   Final    NO GROWTH 2 DAYS Performed at [facility], [address] [place], [place] [address]    Report Status PENDING  Incomplete  C Difficile Quick Screen w PCR reflex     Status: Abnormal   Collection Time: 05/04/20  3:24 AM   Specimen: STOOL  Result Value Ref Range Status   C Diff antigen POSITIVE (A) NEGATIVE Final   C Diff toxin NEGATIVE NEGATIVE Final   C Diff interpretation Results are indeterminate. See PCR results.  Final    Comment: Performed at [facility], [address] [place], [place] [address]  C. Diff by PCR, Reflexed     Status: Abnormal   Collection Time: 05/04/20  3:24 AM  Result Value Ref Range Status   Toxigenic C. Difficile by PCR POSITIVE (A) NEGATIVE Final    Comment: Performed at [facility], [address] [place], [place] [address]  Gastrointestinal Panel by PCR , Stool     Status: Abnormal   Collection Time: 05/04/20  3:33 AM   Specimen: STOOL  Result Value Ref Range Status   Campylobacter species NOT DETECTED NOT DETECTED Final   Plesimonas shigelloides NOT DETECTED NOT DETECTED Final   Salmonella species NOT DETECTED NOT DETECTED Final    Yersinia enterocolitica NOT DETECTED NOT DETECTED Final   Vibrio species NOT DETECTED NOT DETECTED Final   Vibrio cholerae NOT DETECTED NOT DETECTED Final   Enteroaggregative E coli (EAEC) NOT DETECTED NOT DETECTED Final   Enteropathogenic E coli (EPEC) NOT DETECTED NOT DETECTED Final   Enterotoxigenic E coli (ETEC) NOT DETECTED NOT DETECTED Final   Shiga like toxin producing E coli (STEC) NOT DETECTED NOT DETECTED Final   Shigella/Enteroinvasive E coli (EIEC) NOT DETECTED NOT DETECTED Final   Cryptosporidium NOT DETECTED NOT DETECTED Final   Cyclospora cayetanensis NOT DETECTED NOT DETECTED Final   Entamoeba histolytica NOT DETECTED NOT DETECTED Final   Giardia lamblia NOT DETECTED NOT DETECTED Final   Adenovirus F40/41 DETECTED (A) NOT DETECTED Final   Astrovirus NOT DETECTED NOT DETECTED Final   Norovirus GI/GII NOT DETECTED NOT DETECTED Final   Rotavirus A NOT DETECTED NOT DETECTED Final   Sapovirus (I, II, IV, and V) NOT DETECTED NOT DETECTED Final    Comment: Performed at [facility], [address] [place], [place] [address]      Radiology Studies: No results found.   Scheduled Meds: . acetaminophen  1,000 mg Oral TID  . apixaban  5 mg Oral BID  . atorvastatin  40 mg Oral Daily  . carisoprodol  350 mg Oral TID  . dextromethorphan-guaiFENesin  1 tablet Oral BID  . DULoxetine  20 mg Oral Daily  . levothyroxine  75 mcg Oral Q0600  . metoprolol succinate  50 mg Oral Daily  . sodium chloride flush  3 mL Intravenous Q12H  . traZODone  150 mg Oral QHS  . vancomycin  125 mg Oral QID   Continuous Infusions: . sodium chloride    . magnesium sulfate bolus IVPB 2 g (05/06/20 1538)     LOS: 2 days    Darlin Priestly, MD [facility] If 7PM-7AM, please contact night-coverage 05/06/2020, 3:59 PM

## 2020-05-06 NOTE — Consult Note (Signed)
Reason for Consult: Right shoulder pain Referring Physician: Dr. Jarold Song [name] [name] is an 60 y.o. male.  HPI: Patient reports a week history of severe right shoulder pain that occurred when he tried to pull himself up in bed.  He denies prodromal symptoms and prior problems.  He reports difficulty raising the arm and sensation of weakness.  No prior shoulder surgery or treatment  Past Medical History:  Diagnosis Date  . Allergy   . Anxiety   . Arthritis   . Asthma   . Brain damage   . Chronic pain of both knees 07/13/2018  . Clotting disorder (HCC)   . COPD (chronic obstructive pulmonary disease) (HCC)   . Depression   . GERD (gastroesophageal reflux disease)   . HFrEF (heart failure with reduced ejection fraction) (HCC)    a. 03/2018 Echo: EF 25-30%, diff HK. Mod LAE.  [name] History of DVT (deep vein thrombosis)   . History of pulmonary embolism    a. Chronic coumadin.  [name] Hypertension   . MI (myocardial infarction) (HCC)   . Morbid obesity (HCC)   . Neck pain 07/13/2018  . NICM (nonischemic cardiomyopathy) (HCC)    a. s/p Cath x 3 - reportedly nl cors. Last cath 2019 in [place]; b. a. 03/2018 Echo: EF 25-30%, diff HK.  [name] Persistent atrial fibrillation (HCC)    a. 03/2018 s/p DCCV; b. CHA2DS2VASc = 1-->Xarelto (later changed to warfarin); c. 05/2018 recurrent afib-->Amio initiated.  . Sleep apnea   . Sleep apnea     Past Surgical History:  Procedure Laterality Date  . CARDIOVERSION N/A 03/24/2018   Procedure: CARDIOVERSION;  Surgeon: Antonieta Iba, MD;  Location: [facility] ORS;  Service: Cardiovascular;  Laterality: N/A;  . CARDIOVERSION N/A 08/08/2018   Procedure: CARDIOVERSION;  Surgeon: [name], MD;  Location: [facility] ORS;  Service: Cardiovascular;  Laterality: N/A;  . hearnia repair     X 3- total of two surgeries  . HERNIA REPAIR    . LEG SURGERY      Family History  Problem Relation Age of Onset  . Heart failure Mother   . Lung cancer Mother   . Lung cancer Father   .  Heart attack Maternal Grandmother   . Heart attack Maternal Grandfather     Social History:  reports that he has never smoked. He has never used smokeless tobacco. He reports previous alcohol use. He reports that he does not use drugs.  Allergies: No Known Allergies  Medications: I have reviewed the patient's current medications.  Results for orders placed or performed during the hospital encounter of 05/04/20 (from the past 48 hour(s))  Lactic acid, plasma     Status: None   Collection Time: 05/05/20  3:36 AM  Result Value Ref Range   Lactic Acid, Venous 0.6 0.5 - 1.9 mmol/L    Comment: Performed at [facility], [address] [place], [place] [address]  Basic metabolic panel     Status: Abnormal   Collection Time: 05/05/20  3:36 AM  Result Value Ref Range   Sodium 133 (L) 135 - 145 mmol/L   Potassium 4.1 3.5 - 5.1 mmol/L   Chloride 102 98 - 111 mmol/L   CO2 24 22 - 32 mmol/L   Glucose, Bld 107 (H) 70 - 99 mg/dL    Comment: Glucose reference range applies only to samples taken after fasting for at least 8 hours.   BUN 16 6 - 20 mg/dL   Creatinine, Ser 4.23  0.61 - 1.24 mg/dL   Calcium 8.1 (L) 8.9 - 10.3 mg/dL   GFR, Estimated >31 >51 mL/min    Comment: (NOTE) Calculated using the CKD-EPI Creatinine Equation (2021)    Anion gap 7 5 - 15    Comment: Performed at [facility], [address] [place], [place] [address]  Magnesium     Status: None   Collection Time: 05/05/20  3:36 AM  Result Value Ref Range   Magnesium 1.9 1.7 - 2.4 mg/dL    Comment: Performed at [facility], [address] [place], [place] [address]  CBC     Status: Abnormal   Collection Time: 05/05/20  4:45 AM  Result Value Ref Range   WBC 3.8 (L) 4.0 - 10.5 K/uL   RBC 3.05 (L) 4.22 - 5.81 MIL/uL   Hemoglobin 9.7 (L) 13.0 - 17.0 g/dL   HCT 62.6 (L) 94.8 - 54.6 %   MCV 100.7 (H) 80.0 - 100.0 fL   MCH 31.8 26.0 - 34.0 pg   MCHC 31.6 30.0 - 36.0 g/dL   RDW 27.0 (H) 35.0 -  15.5 %   Platelets 165 150 - 400 K/uL   nRBC 0.0 0.0 - 0.2 %    Comment: Performed at [facility], [address], [place], [place] [address]  Basic metabolic panel     Status: Abnormal   Collection Time: 05/06/20  6:25 AM  Result Value Ref Range   Sodium 137 135 - 145 mmol/L   Potassium 4.1 3.5 - 5.1 mmol/L   Chloride 94 (L) 98 - 111 mmol/L   CO2 37 (H) 22 - 32 mmol/L   Glucose, Bld 137 (H) 70 - 99 mg/dL    Comment: Glucose reference range applies only to samples taken after fasting for at least 8 hours.   BUN 10 6 - 20 mg/dL   Creatinine, Ser 8.29 0.61 - 1.24 mg/dL   Calcium 9.0 8.9 - 93.7 mg/dL   GFR, Estimated >16 >96 mL/min    Comment: (NOTE) Calculated using the CKD-EPI Creatinine Equation (2021)    Anion gap 6 5 - 15    Comment: Performed at [facility], [address] [place], [place] [address]  CBC     Status: Abnormal   Collection Time: 05/06/20  6:25 AM  Result Value Ref Range   WBC 3.6 (L) 4.0 - 10.5 K/uL   RBC 3.12 (L) 4.22 - 5.81 MIL/uL   Hemoglobin 10.2 (L) 13.0 - 17.0 g/dL   HCT 10.1 (L) 75.1 - 02.5 %   MCV 100.0 80.0 - 100.0 fL   MCH 32.7 26.0 - 34.0 pg   MCHC 32.7 30.0 - 36.0 g/dL   RDW 85.2 (H) 77.8 - 24.2 %   Platelets 180 150 - 400 K/uL   nRBC 0.0 0.0 - 0.2 %    Comment: Performed at [facility], [address] [place], [place] [address]  Magnesium     Status: Abnormal   Collection Time: 05/06/20  6:25 AM  Result Value Ref Range   Magnesium 1.6 (L) 1.7 - 2.4 mg/dL    Comment: Performed at [facility], [address] [place], [place] [address]    X-rays of the shoulder show no significant degenerative change Review of Systems Blood pressure 121/85, pulse 89, temperature 97.9 F (36.6 C), temperature source Oral, resp. rate 20, height 5\' 8"  (1.727 m), weight (!) 185.4 kg, SpO2 98 %. Physical Exam Active forward flexion is 60 abduction  50 passive range of motion is full with pain in flexion and abduction  above 120.  Pain with resisted external rotation with some weakness internal rotation strength normal. Assessment/Plan: Probable rotator cuff strain plan subacromial injection to help with pain.  [name] [name] 05/06/2020, 7:48 PM

## 2020-05-07 ENCOUNTER — Ambulatory Visit: Payer: Medicaid Other | Admitting: Family Medicine

## 2020-05-07 DIAGNOSIS — J9621 Acute and chronic respiratory failure with hypoxia: Secondary | ICD-10-CM | POA: Diagnosis not present

## 2020-05-07 DIAGNOSIS — I482 Chronic atrial fibrillation, unspecified: Secondary | ICD-10-CM | POA: Diagnosis not present

## 2020-05-07 DIAGNOSIS — A0472 Enterocolitis due to Clostridium difficile, not specified as recurrent: Secondary | ICD-10-CM

## 2020-05-07 DIAGNOSIS — Z789 Other specified health status: Secondary | ICD-10-CM | POA: Diagnosis not present

## 2020-05-07 DIAGNOSIS — I5033 Acute on chronic diastolic (congestive) heart failure: Secondary | ICD-10-CM | POA: Diagnosis not present

## 2020-05-07 DIAGNOSIS — I872 Venous insufficiency (chronic) (peripheral): Secondary | ICD-10-CM | POA: Diagnosis not present

## 2020-05-07 DIAGNOSIS — J9622 Acute and chronic respiratory failure with hypercapnia: Secondary | ICD-10-CM | POA: Diagnosis not present

## 2020-05-07 DIAGNOSIS — G4733 Obstructive sleep apnea (adult) (pediatric): Secondary | ICD-10-CM | POA: Diagnosis not present

## 2020-05-07 LAB — CBC
HCT: 30.9 % — ABNORMAL LOW (ref 39.0–52.0)
Hemoglobin: 10.2 g/dL — ABNORMAL LOW (ref 13.0–17.0)
MCH: 32.6 pg (ref 26.0–34.0)
MCHC: 33 g/dL (ref 30.0–36.0)
MCV: 98.7 fL (ref 80.0–100.0)
Platelets: 226 10*3/uL (ref 150–400)
RBC: 3.13 MIL/uL — ABNORMAL LOW (ref 4.22–5.81)
RDW: 15.9 % — ABNORMAL HIGH (ref 11.5–15.5)
WBC: 4 10*3/uL (ref 4.0–10.5)
nRBC: 0 % (ref 0.0–0.2)

## 2020-05-07 LAB — BASIC METABOLIC PANEL
Anion gap: 7 (ref 5–15)
BUN: 10 mg/dL (ref 6–20)
CO2: 33 mmol/L — ABNORMAL HIGH (ref 22–32)
Calcium: 8.8 mg/dL — ABNORMAL LOW (ref 8.9–10.3)
Chloride: 95 mmol/L — ABNORMAL LOW (ref 98–111)
Creatinine, Ser: 0.62 mg/dL (ref 0.61–1.24)
GFR, Estimated: >60 mL/min (ref >60)
Glucose, Bld: 131 mg/dL — ABNORMAL HIGH (ref 70–99)
Potassium: 4.9 mmol/L (ref 3.5–5.1)
Sodium: 135 mmol/L (ref 135–145)

## 2020-05-07 LAB — MAGNESIUM: Magnesium: 1.9 mg/dL (ref 1.7–2.4)

## 2020-05-07 MED ORDER — BUTALBITAL-APAP-CAFFEINE 50-325-40 MG PO TABS
1.0000 | ORAL_TABLET | Freq: Four times a day (QID) | ORAL | Status: DC | PRN
  Administered 2020-05-07 – 2020-05-14 (×4): 1 via ORAL
  Filled 2020-05-07 (×4): qty 1

## 2020-05-07 MED ORDER — MONTELUKAST SODIUM 10 MG PO TABS
10.0000 mg | ORAL_TABLET | Freq: Every day | ORAL | Status: DC
  Administered 2020-05-07 – 2020-05-15 (×9): 10 mg via ORAL
  Filled 2020-05-07 (×9): qty 1

## 2020-05-07 MED ORDER — ALBUTEROL SULFATE HFA 108 (90 BASE) MCG/ACT IN AERS
2.0000 | INHALATION_SPRAY | RESPIRATORY_TRACT | Status: DC | PRN
  Filled 2020-05-07: qty 6.7

## 2020-05-07 MED ORDER — NAPHAZOLINE-GLYCERIN 0.012-0.2 % OP SOLN
1.0000 [drp] | Freq: Four times a day (QID) | OPHTHALMIC | Status: DC | PRN
  Filled 2020-05-07: qty 15

## 2020-05-07 MED ORDER — OXYCODONE-ACETAMINOPHEN 7.5-325 MG PO TABS
1.0000 | ORAL_TABLET | Freq: Four times a day (QID) | ORAL | Status: DC | PRN
Start: 2020-05-07 — End: 2020-05-16
  Administered 2020-05-07 – 2020-05-16 (×26): 1 via ORAL
  Filled 2020-05-07 (×26): qty 1

## 2020-05-07 NOTE — Progress Notes (Addendum)
PROGRESS NOTE    [name] [name]   NWG:[id number]  DOB: 1960/12/16  PCP: [name], DO    DOA: 05/04/2020 LOS: 3   Brief Narrative   [name] Fallen Hubbardis a 60 y.o.malewith medical history significant forClass III obesity, BMI over 60, depression, HFrEF with last EF 30 to 35%, COPD, OSA chronic respiratory failure on home O2 at 3 L and on CPAP, A. fib and history of DVT onEliquis, CAD recently hospitalized from 2/22 - 3/14 with complications of Covid pneumonia requiring intubation, rehospitalized from 3/16-3/18, who returns to the emergency room with complaints of protracted weakness, inability to manage at home.    Patient, who lives alone was apparently discharged on 3/18 after he declined skilled rehab placement. He states that he would really like to go to rehab but is unwilling to give up his check because he has no other way to pay his bills.  Patient had called EMS multiple times since being DC from hospital on 3/18 needing assistance of moving to and from bed to chair and needing assistance with toilet and hygiene.  Patient is unable to care for self at home alone.  Assessment & Plan   Principal Problem:   Acute on chronic respiratory failure with hypoxia and hypercapnia (HCC) Active Problems:   Sepsis (HCC)   Acute on chronic diastolic CHF (congestive heart failure) (HCC)   Obstructive sleep apnea   Chronic atrial fibrillation (HCC)   HTN (hypertension)   Morbid obesity with BMI of 60.0-69.9, adult (HCC)   CAD (coronary artery disease)   Hypothyroidism   Chronic venous stasis dermatitis of both lower extremities   Chronic anticoagulation (warfarin  COUMADIN)   COPD (chronic obstructive pulmonary disease) (HCC)   Dyspnea and respiratory abnormality   Clostridium difficile diarrhea   Elevated lactic acid level   Unable to care for self   # Chronic respiratory failure with hypoxia and hypercapnia on 2-3L home O2 # COPD # OSA on CPAP nightly --pt has  chronic dyspnea and BLE edema.  BNP actually better than baseline on presentation.  PCO2 61 up from baseline of 50-55 though with normal pH, and could be due to missing his nightly CPAP. --on presentation, sating 100% on home 3L O2. Plan: --cont home 2-3L supplemental O2 --cont CPAP while sleeping  Diarrhea, improved Possible Clostridium difficile  Adenovirus F40/41 gastroenteritis -Patient noted to have frequent watery stools in the ER, which started after presentation. -C. difficile antigen positive, and PCR positive.  No fever, no leukocytosis, but since having diarrhea, oral vanc started on presentation.  Diarrhea has since improved. --GI path returned positive for Adenovirus F40/41. Plan: --cont oral vanc for a 10-day course, per ID rec  Sepsis ruled out --No fever, no leukocytosis, procal neg.    Lactic acidosis, resolved --3.8 on admission.  Unclear etiology. --resolved with NS@50  for 10 hours  chronic diastolic CHF (exacerbation ruled out)  Chronic anticoagulation Chronic atrial fibrillation (HCC) History of DVT --cont home Toprol -Continue Eliquis  Morbid obesity with BMI of 60.0-69.9,with physical deconditioning Unable to care for self Body mass index is 62.01 kg/m. --TOC to work on placement to SNF --PT/OT while inpatient --requested air mattress to off-load pressure  Chronic venous stasis dermatitis both lower extremities --edema and erythema equal bilaterally, does not have infection.  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 --resumed on prior home regimen  --cont home Soma --Tylenol 1g TID scheduled for 5 days --Naproxen PRN for pain  Right shoulder pain, POA Probable rotator  cuff strain --started a couple of week ago while pt was trying to pull himself up in bed.  Xray no acute finding.  MRI shoulder could not be performed due to him not fitting in the MRI machine. --Ortho was consulted and did joint injection 3/22 --Pain control as above    DVT prophylaxis:  apixaban (ELIQUIS) tablet 5 mg   Diet:  Diet Orders (From admission, onward)    Start     Ordered   05/05/20 1354  Diet Heart Room service appropriate? Yes; Fluid consistency: Thin  Diet effective now       Question Answer Comment  Room service appropriate? Yes   Fluid consistency: Thin      05/05/20 1353            Code Status: Full Code    Subjective 05/07/20    Pt up in recliner when seen.  Reports being extremely weak since Covid admission, was weak before that, says now can barely stand.  Having his chronic pain, shoulder injfection yesterday does not notice different yet.  No other acute complaints.   Disposition Plan & Communication   Status is: Inpatient  Inpatient appropriate due to unsafe d/c, requires SNF, liikely long term placement.  Dispo: The patient is from: Home              Anticipated d/c is to: SNF              Patient currently is medically stable for d/c   Difficult to place patient Yes     Consults, Procedures, Significant Events   Consultants:     Procedures:   Shoulder injection  Antimicrobials:  Anti-infectives (From admission, onward)   Start     Dose/Rate Route Frequency Ordered Stop   05/04/20 1000  vancomycin (VANCOCIN) 50 mg/mL oral solution 125 mg        125 mg Oral 4 times daily 05/04/20 0514 05/14/20 0959        Micro    Objective   Vitals:   05/06/20 1544 05/06/20 2109 05/07/20 0407 05/07/20 0432  BP: 121/85 (!) 98/58 (!) 95/53   Pulse: 89 86 77   Resp: 20 20 20    Temp: 97.9 F (36.6 C) (!) 97.5 F (36.4 C) 97.8 F (36.6 C)   TempSrc: Oral Oral Oral   SpO2: 98% 99% 99%   Weight:    (!) 185 kg  Height:         Intake/Output Summary (Last 24 hours) at 05/07/2020 1954 Last data filed at 05/07/2020 1741 Gross per 24 hour  Intake 236 ml  Output 1750 ml  Net -1514 ml   Filed Weights   05/05/20 0500 05/06/20 0358 05/07/20 0432  Weight: (!) 165.1 kg (!) 185.4 kg (!) 185 kg    Physical Exam:  General exam: awake, alert, no acute distress, morbidly obese, appears fatigued Respiratory system: diminished due to body habitus, no wheezes, rales or rhonchi, normal respiratory effort. Cardiovascular system: normal S1/S2, RRR, severe b/l LE edema.   Gastrointestinal system: protuberant abdomen, nontender Central nervous system: A&O x3. no gross focal neurologic deficits, normal speech Extremities: moves all, b/l LE edema and venous stasis  Psychiatry: normal mood, congruent affect, judgement and insight appear normal  Labs   Data Reviewed: I  have personally reviewed following labs and imaging studies  CBC: Recent Labs  Lab 05/01/20 0518 05/04/20 0137 05/05/20 0445 05/06/20 0625 05/07/20 0626  WBC 4.7 4.9 3.8* 3.6* 4.0  NEUTROABS  --  2.4  --   --   --   HGB 9.3* 10.6* 9.7* 10.2* 10.2*  HCT 28.4* 32.8* 30.7* 31.2* 30.9*  MCV 99.3 99.4 100.7* 100.0 98.7  PLT 120* 195 165 180 226   Basic Metabolic Panel: Recent Labs  Lab 05/01/20 0518 05/04/20 0137 05/05/20 0336 05/06/20 0625 05/07/20 0626  NA 136 132* 133* 137 135  K 3.4* 3.6 4.1 4.1 4.9  CL 90* 88* 102 94* 95*  CO2 36* 30 24 37* 33*  GLUCOSE 206* 133* 107* 137* 131*  BUN 18 14 16 10 10   CREATININE 0.65 0.86 1.01 0.66 0.62  CALCIUM 8.8* 8.6* 8.1* 9.0 8.8*  MG  --   --  1.9 1.6* 1.9   GFR: Estimated Creatinine Clearance: 161.7 mL/min (by C-G formula based on SCr of 0.62 mg/dL). Liver Function Tests: Recent Labs  Lab 05/04/20 0137  AST 27  ALT 32  ALKPHOS 59  BILITOT 0.7  PROT 6.4*  ALBUMIN 3.2*   Recent Labs  Lab 05/04/20 0137  LIPASE 59*   No results for input(s): AMMONIA in the last 168 hours. Coagulation  Profile: Recent Labs  Lab 05/04/20 0256  INR 1.3*   Cardiac Enzymes: No results for input(s): CKTOTAL, CKMB, CKMBINDEX, TROPONINI in the last 168 hours. BNP (last 3 results) No results for input(s): PROBNP in the last 8760 hours. HbA1C: No results for input(s): HGBA1C in the last 72 hours. CBG: Recent Labs  Lab 04/30/20 2129 05/01/20 0839 05/01/20 1311 05/01/20 1536 05/01/20 2042  GLUCAP 222* 207* 164* 204* 167*   Lipid Profile: No results for input(s): CHOL, HDL, LDLCALC, TRIG, CHOLHDL, LDLDIRECT in the last 72 hours. Thyroid Function Tests: No results for input(s): TSH, T4TOTAL, FREET4, T3FREE, THYROIDAB in the last 72 hours. Anemia Panel: No results for input(s): VITAMINB12, FOLATE, FERRITIN, TIBC, IRON, RETICCTPCT in the last 72 hours. Sepsis Labs: Recent Labs  Lab 05/04/20 0137 05/04/20 0256 05/05/20 0336  PROCALCITON <0.10  --   --   LATICACIDVEN  --  3.8* 0.6    Recent Results (from the past 240 hour(s))  Urine Culture     Status: Abnormal   Collection Time: 04/30/20  8:07 AM   Specimen: Urine, Random  Result Value Ref Range Status   Specimen Description   Final    URINE, RANDOM Performed at [facility], [address], [place], [place] [address]    Special Requests   Final    NONE Performed at [facility], [address] [place], [place] [address]    Culture >=100,000 COLONIES/mL KLEBSIELLA PNEUMONIAE (A)  Final   Report Status 05/03/2020 FINAL  Final   Organism ID, Bacteria KLEBSIELLA PNEUMONIAE (A)  Final      Susceptibility   Klebsiella pneumoniae - MIC*    AMPICILLIN >=32 RESISTANT Resistant     CEFAZOLIN <=4 SENSITIVE Sensitive     CEFEPIME <=0.12 SENSITIVE Sensitive     CEFTRIAXONE <=0.25 SENSITIVE Sensitive     CIPROFLOXACIN <=0.25 SENSITIVE Sensitive     GENTAMICIN <=1 SENSITIVE Sensitive     IMIPENEM <=0.25 SENSITIVE Sensitive     NITROFURANTOIN <=16 SENSITIVE Sensitive     TRIMETH/SULFA <=20 SENSITIVE Sensitive      AMPICILLIN/SULBACTAM 4 SENSITIVE Sensitive     PIP/TAZO 8 SENSITIVE Sensitive     * >=  100,000 COLONIES/mL KLEBSIELLA PNEUMONIAE  Blood culture (routine x 2)     Status: None   Collection Time: 04/30/20  9:59 AM   Specimen: BLOOD  Result Value Ref Range Status   Specimen Description BLOOD RIGHT [facility]  Final   Special Requests   Final    BOTTLES DRAWN AEROBIC AND ANAEROBIC Blood Culture adequate volume   Culture   Final    NO GROWTH 5 DAYS Performed at [facility], [address], [place], [place] [address]    Report Status 05/05/2020 FINAL  Final  Blood culture (routine x 2)     Status: None   Collection Time: 04/30/20 10:09 AM   Specimen: BLOOD  Result Value Ref Range Status   Specimen Description BLOOD RIGHT ASSIST CONTROL  Final   Special Requests   Final    BOTTLES DRAWN AEROBIC AND ANAEROBIC Blood Culture adequate volume   Culture   Final    NO GROWTH 5 DAYS Performed at [facility], [address], [place], [place] [address]    Report Status 05/05/2020 FINAL  Final  Urine culture     Status: Abnormal   Collection Time: 05/04/20 12:56 AM   Specimen: In/Out Cath Urine  Result Value Ref Range Status   Specimen Description   Final    IN/OUT CATH URINE Performed at [facility], [address], [place], [place] [address]    Special Requests   Final    NONE Performed at [facility], [address], [place], [place] [address]    Culture (A)  Final    <10,000 COLONIES/mL INSIGNIFICANT GROWTH Performed at [facility], [address] [place], [place] [address]    Report Status 05/05/2020 FINAL  Final  Blood culture (routine single)     Status: None (Preliminary result)   Collection Time: 05/04/20  1:37 AM   Specimen: BLOOD  Result Value Ref Range Status   Specimen Description BLOOD  RTAC  Final   Special Requests   Final    BOTTLES DRAWN AEROBIC ONLY Blood Culture results may not be optimal due to an inadequate volume of  blood received in culture bottles   Culture   Final    NO GROWTH 3 DAYS Performed at [facility], [address], [place], [place] [address]    Report Status PENDING  Incomplete  C Difficile Quick Screen w PCR reflex     Status: Abnormal   Collection Time: 05/04/20  3:24 AM   Specimen: STOOL  Result Value Ref Range Status   C Diff antigen POSITIVE (A) NEGATIVE Final   C Diff toxin NEGATIVE NEGATIVE Final   C Diff interpretation Results are indeterminate. See PCR results.  Final    Comment: Performed at [facility], [address] [place], [place] [address]  C. Diff by PCR, Reflexed     Status: Abnormal   Collection Time: 05/04/20  3:24 AM  Result Value Ref Range Status   Toxigenic C. Difficile by PCR POSITIVE (A) NEGATIVE Final    Comment: Performed at [facility], [address] [place], [place] [address]  Gastrointestinal Panel by PCR , Stool     Status: Abnormal   Collection Time: 05/04/20  3:33 AM   Specimen: STOOL  Result Value Ref Range Status   Campylobacter species NOT DETECTED NOT DETECTED Final   Plesimonas shigelloides NOT DETECTED NOT DETECTED Final   Salmonella species NOT DETECTED NOT DETECTED Final   Yersinia enterocolitica  NOT DETECTED NOT DETECTED Final   Vibrio species NOT DETECTED NOT DETECTED Final   Vibrio cholerae NOT DETECTED NOT DETECTED Final   Enteroaggregative E coli (EAEC) NOT DETECTED NOT DETECTED Final   Enteropathogenic E coli (EPEC) NOT DETECTED NOT DETECTED Final   Enterotoxigenic E coli (ETEC) NOT DETECTED NOT DETECTED Final   Shiga like toxin producing E coli (STEC) NOT DETECTED NOT DETECTED Final   Shigella/Enteroinvasive E coli (EIEC) NOT DETECTED NOT DETECTED Final   Cryptosporidium NOT DETECTED NOT DETECTED Final   Cyclospora cayetanensis NOT DETECTED NOT DETECTED Final   Entamoeba histolytica NOT DETECTED NOT DETECTED Final   Giardia lamblia NOT DETECTED NOT DETECTED Final   Adenovirus F40/41 DETECTED (A)  NOT DETECTED Final   Astrovirus NOT DETECTED NOT DETECTED Final   Norovirus GI/GII NOT DETECTED NOT DETECTED Final   Rotavirus A NOT DETECTED NOT DETECTED Final   Sapovirus (I, II, IV, and V) NOT DETECTED NOT DETECTED Final    Comment: Performed at [facility], [address], [place], [place] [address]      Imaging Studies   No results found.   Medications   Scheduled Meds: . acetaminophen  1,000 mg Oral TID  . apixaban  5 mg Oral BID  . atorvastatin  40 mg Oral Daily  . bupivacaine  10 mL Infiltration Once  . carisoprodol  350 mg Oral TID  . dextromethorphan-guaiFENesin  1 tablet Oral BID  . DULoxetine  20 mg Oral Daily  . levothyroxine  75 mcg Oral Q0600  . metoprolol succinate  50 mg Oral Daily  . montelukast  10 mg Oral QHS  . sodium chloride flush  3 mL Intravenous Q12H  . traZODone  150 mg Oral QHS  . triamcinolone acetonide  40 mg Intra-articular Once  . vancomycin  125 mg Oral QID   Continuous Infusions: . sodium chloride         LOS: 3 days    Time spent: 25 minutes    Pennie Banter, DO [facility]  05/07/2020, 7:54 PM      If 7PM-7AM, please contact night-coverage. How to contact the [facility] Attending or Consulting provider 7A - 7P or covering provider during after hours 7P -7A, for this patient?    1. Check the care team in [facility] and look for a) attending/consulting [facility] provider listed and b) the [facility] team listed 2. Log into [email or web address] and use [facility] universal password to access. If you do not have the password, please contact the hospital operator. 3. Locate the [facility] provider you are looking for under [facility] and page to a number that you can be directly reached. 4. If you still have difficulty reaching the provider, please page the [facility] (Director on Call) for the Hospitalists listed on amion for assistance.

## 2020-05-08 ENCOUNTER — Encounter: Payer: Medicaid Other | Admitting: Occupational Therapy

## 2020-05-08 DIAGNOSIS — J9622 Acute and chronic respiratory failure with hypercapnia: Secondary | ICD-10-CM | POA: Diagnosis not present

## 2020-05-08 DIAGNOSIS — I872 Venous insufficiency (chronic) (peripheral): Secondary | ICD-10-CM

## 2020-05-08 DIAGNOSIS — Z789 Other specified health status: Secondary | ICD-10-CM | POA: Diagnosis not present

## 2020-05-08 DIAGNOSIS — I5033 Acute on chronic diastolic (congestive) heart failure: Secondary | ICD-10-CM

## 2020-05-08 DIAGNOSIS — J9621 Acute and chronic respiratory failure with hypoxia: Secondary | ICD-10-CM | POA: Diagnosis not present

## 2020-05-08 DIAGNOSIS — I482 Chronic atrial fibrillation, unspecified: Secondary | ICD-10-CM | POA: Diagnosis not present

## 2020-05-08 DIAGNOSIS — G4733 Obstructive sleep apnea (adult) (pediatric): Secondary | ICD-10-CM | POA: Diagnosis not present

## 2020-05-08 DIAGNOSIS — A0472 Enterocolitis due to Clostridium difficile, not specified as recurrent: Secondary | ICD-10-CM | POA: Diagnosis not present

## 2020-05-08 MED ORDER — TORSEMIDE 20 MG PO TABS: 80.0000 mg | ORAL_TABLET | Freq: Two times a day (BID) | ORAL | Status: DC

## 2020-05-08 MED ORDER — FUROSEMIDE 10 MG/ML IJ SOLN
60.0000 mg | Freq: Every day | INTRAMUSCULAR | Status: DC
  Administered 2020-05-08: 60 mg via INTRAVENOUS
  Filled 2020-05-08: qty 8

## 2020-05-08 NOTE — Plan of Care (Signed)
Pt calm and cooperative and able to voice his needs. Pt was sitting on a chair at beginning of shift. Pt able to use walker to lay down in bed. Prn pain med adm per MD orders, effective. Safety measures in place. Will continue to monitor.  Problem: Education: Goal: Knowledge of General Education information will improve Description: Including pain rating scale, medication(s)/side effects and non-pharmacologic comfort measures Outcome: Progressing   Problem: Health Behavior/Discharge Planning: Goal: Ability to manage health-related needs will improve Outcome: Progressing   Problem: Clinical Measurements: Goal: Ability to maintain clinical measurements within normal limits will improve Outcome: Progressing Goal: Will remain free from infection Outcome: Progressing Goal: Diagnostic test results will improve Outcome: Progressing Goal: Respiratory complications will improve Outcome: Progressing Goal: Cardiovascular complication will be avoided Outcome: Progressing   Problem: Activity: Goal: Risk for activity intolerance will decrease Outcome: Progressing   Problem: Nutrition: Goal: Adequate nutrition will be maintained Outcome: Progressing   Problem: Coping: Goal: Level of anxiety will decrease Outcome: Progressing   Problem: Elimination: Goal: Will not experience complications related to bowel motility Outcome: Progressing Goal: Will not experience complications related to urinary retention Outcome: Progressing   Problem: Pain Managment: Goal: General experience of comfort will improve Outcome: Progressing   Problem: Safety: Goal: Ability to remain free from injury will improve Outcome: Progressing   Problem: Skin Integrity: Goal: Risk for impaired skin integrity will decrease Outcome: Progressing

## 2020-05-08 NOTE — Consult Note (Addendum)
WOC Nurse Consult Note: Reason for Consult: Bilateral LE edema with chronic nonhealing full thickness wound to LLE lateral aspectand early (partial thickness) wound to anterior RLE. Patient cannot elevate LEs he reports and sits in a recliner chair, but not reclining most of the day. States he cannot lie in our beds. Known to our department from previous admissions. Wound type: Lymphedema Pressure Injury POA: NA Measurement: LLE wound measures 2.8cm x 2cm x 0.2cm with ruddy red wound base and moderate serous drainage. RLE measures 0.4cm x 2cm x 0.1cm with serous drainage in a small amount but with maceration in the periwound areas as wound is in an adipose fold (intertriginous) Wound bed:As noted above Drainage (amount, consistency, odor) As noted above Periwound:As noted above and edematous.   Patient is counseled about needing to elevate LEs-ideally above heart-several times a day and at mention of this he waves his hand to stop the education. He tells me he knows "all he needs to know" about his legs. He tells me that he cannot make it regularly to the outpatient [facility] but that he has been referred to a lymphedema clinic.   I will implement the POC appropriate for inpatient care and for providing topical care to his LLE wound and preulcerative area on the RLE using silver hydrofiber to the areas and securing with Kerlix roll gauze topped with Coban self adherent wrap. Daily changes will be ordered until discharge.  WOC nursing team will not follow, but will remain available to this patient, the nursing and medical teams.  Please re-consult if needed. Thanks, Ladona Mow, MSN, RN, GNP, [place]  Pager# [phone]

## 2020-05-08 NOTE — Progress Notes (Signed)
PROGRESS NOTE    [name] [name]   QAS:[id number]  DOB: Jan 13, 1961  PCP: [name], DO    DOA: 05/04/2020 LOS: 4   Brief Narrative   [name] Fallen Hubbardis a 60 y.o.malewith medical history significant forClass III obesity, BMI over 60, depression, HFrEF with last EF 30 to 35%, COPD, OSA chronic respiratory failure on home O2 at 3 L and on CPAP, A. fib and history of DVT onEliquis, CAD recently hospitalized from 2/22 - 3/14 with complications of Covid pneumonia requiring intubation, rehospitalized from 3/16-3/18, who returns to the emergency room with complaints of protracted weakness, inability to manage at home.    Patient, who lives alone was apparently discharged on 3/18 after he declined skilled rehab placement. He states that he would really like to go to rehab but is unwilling to give up his check because he has no other way to pay his bills.  Patient had called EMS multiple times since being DC from hospital on 3/18 needing assistance of moving to and from bed to chair and needing assistance with toilet and hygiene.  Patient is unable to care for self at home alone.  Assessment & Plan   Principal Problem:   Acute on chronic respiratory failure with hypoxia and hypercapnia (HCC) Active Problems:   Sepsis (HCC)   Acute on chronic diastolic CHF (congestive heart failure) (HCC)   Obstructive sleep apnea   Chronic atrial fibrillation (HCC)   HTN (hypertension)   Morbid obesity with BMI of 60.0-69.9, adult (HCC)   CAD (coronary artery disease)   Hypothyroidism   Chronic venous stasis dermatitis of both lower extremities   Chronic anticoagulation (warfarin  COUMADIN)   COPD (chronic obstructive pulmonary disease) (HCC)   Dyspnea and respiratory abnormality   Clostridium difficile diarrhea   Elevated lactic acid level   Unable to care for self   Chronic respiratory failure with hypoxia and hypercapnia on 2-3L home O2 COPD OSA on CPAP nightly --pt has  chronic dyspnea and BLE edema.  BNP actually better than baseline on presentation.  PCO2 61 up from baseline of 50-55 though with normal pH, and could be due to missing his nightly CPAP. --on presentation, sating 100% on home 3L O2. Plan: --cont home 2-3L supplemental O2 --cont CPAP while sleeping  Lower extremity edema /lymphedema -is followed by wound care clinic for Unna boots.   WOC consulted appreciate recommendations: "Wound care to bilateral LEs: Cleanse with soap and water, rinse and pat dry. Cover left lateral LE full thickness wound and right LE anterior partial thickness lesions with size appropriate pieces of silver hydrofiber (Aquacel Advantage, Lawson # [id number]). Top with ABD pads. Secure by wrapping from just below toes to just below knees with Kerlix roll gauze, top with 4-inch Coban (self-adherent wrap) applied in a similar manner. Attempt to elevate LEs while patient is sitting in the chair at intervals (example of goal would be for 1 hour every 4 hours)." --Diuresis as below  Diarrhea, improved Possible Clostridium difficile  Adenovirus F40/41 gastroenteritis -Patient noted to have frequent watery stools in the ER, which started after presentation. -C. difficile antigen positive, and PCR positive.  No fever, no leukocytosis, but since having diarrhea, oral vanc started on presentation.  Diarrhea has since improved. --GI path returned positive for Adenovirus F40/41. Plan: --cont oral vanc for a 10-day course, per ID rec  Sepsis ruled out --No fever, no leukocytosis, procal neg.    Lactic acidosis, resolved --3.8 on admission.  Unclear etiology. --resolved with NS@50   for 10 hours  Chronic diastolic CHF -fairly stable and compensated.  Does have increasing lower extremity edema however.  Appears by med history that he may not been taking his torsemide.  Has not been on diuretic yet here. --Resume diuresis with Lasix 60 mg IV daily, adjust dosing and frequency as  needed   Chronic anticoagulation Chronic atrial fibrillation (HCC) History of DVT --cont home Toprol -Continue Eliquis  Morbid obesity with BMI of 60.0-69.9,with physical deconditioning Unable to care for self Body mass index is 61.51 kg/m. --TOC to work on placement to SNF --PT/OT while inpatient --requested air mattress to off-load pressure  Chronic venous stasis dermatitis both lower extremities --edema and erythema equal bilaterally, does not have infection.  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 --resumed on prior home regimen  --cont home Soma --Tylenol 1g TID scheduled for 5 days --Naproxen PRN for pain  Right shoulder pain, POA Probable rotator cuff strain --started a couple of week ago while pt was trying to pull himself up in bed.  Xray no acute finding.  MRI shoulder could not be performed due to him not fitting in the MRI machine. --Ortho was consulted and did joint injection 3/22 --Pain control as above    DVT prophylaxis:  apixaban (ELIQUIS) tablet 5 mg   Diet:  Diet Orders (From admission, onward)    Start     Ordered   05/05/20 1354  Diet Heart Room service appropriate? Yes; Fluid consistency: Thin  Diet effective now       Question Answer Comment  Room service appropriate? Yes   Fluid consistency: Thin      05/05/20 1353            Code Status: Full Code    Subjective 05/08/20    Pt up in recliner when seen.  He reports he tolerated standing, but not yet walking which she hopes to be able to do soon.  Resumed on prior home pain medication which she says is not helping as much as it typically does.  Has not been able to sleep.  Reports some weeping from the lower  extremity wounds.  No fevers or chills, chest pain, nausea vomiting or other acute complaints.   Disposition Plan & Communication   Status is: Inpatient  Inpatient appropriate due to unsafe d/c, requires SNF, liikely long term placement.  Dispo: The patient is from: Home              Anticipated d/c is to: SNF              Patient currently is medically stable for d/c   Difficult to place patient Yes     Consults, Procedures, Significant Events   Consultants:     Procedures:   Shoulder injection  Antimicrobials:  Anti-infectives (From admission, onward)   Start     Dose/Rate Route Frequency Ordered Stop   05/04/20 1000  vancomycin (VANCOCIN) 50 mg/mL oral solution 125 mg        125 mg Oral 4 times daily 05/04/20 0514 05/14/20 0959        Micro    Objective   Vitals:   05/08/20 0418 05/08/20 0500 05/08/20 0936 05/08/20 1125  BP: 104/66  (!) 150/75 (!) 142/85  Pulse: 86  100 73  Resp: 16   16  Temp: (!) 97.5 F (36.4 C)  98.1 F (36.7 C) 98.3 F (36.8 C)  TempSrc: Oral  Oral Oral  SpO2: 100%  100% 97%  Weight:  (!) 183.5 kg    Height:  5\' 8"  (1.727 m)      Intake/Output Summary (Last 24 hours) at 05/08/2020 1552 Last data filed at 05/08/2020 0844 Gross per 24 hour  Intake 120 ml  Output 1120 ml  Net -1000 ml   Filed Weights   05/06/20 0358 05/07/20 0432 05/08/20 0500  Weight: (!) 185.4 kg (!) 185 kg (!) 183.5 kg    Physical Exam:  General exam: awake, alert, no acute distress, morbidly obese, appears fatigued Respiratory system: diminished due to body habitus, normal respiratory effort. Cardiovascular system: normal S1/S2, RRR, severe b/l LE edema.   Gastrointestinal system: protuberant abdomen, nontender Central nervous system: A&O x3. no gross focal neurologic deficits, normal speech Extremities: Increasing bilateral lower extremity edema, chronic venous stasis  Psychiatry: normal mood, congruent affect, judgement and insight appear  normal  Labs   Data Reviewed: I have personally reviewed following labs and imaging studies  CBC: Recent Labs  Lab 05/04/20 0137 05/05/20 0445 05/06/20 0625 05/07/20 0626  WBC 4.9 3.8* 3.6* 4.0  NEUTROABS 2.4  --   --   --   HGB 10.6* 9.7* 10.2* 10.2*  HCT 32.8* 30.7* 31.2* 30.9*  MCV 99.4 100.7* 100.0 98.7  PLT 195 165 180 226   Basic Metabolic Panel: Recent Labs  Lab 05/04/20 0137 05/05/20 0336 05/06/20 0625 05/07/20 0626  NA 132* 133* 137 135  K 3.6 4.1 4.1 4.9  CL 88* 102 94* 95*  CO2 30 24 37* 33*  GLUCOSE 133* 107* 137* 131*  BUN 14 16 10 10   CREATININE 0.86 1.01 0.66 0.62  CALCIUM 8.6* 8.1* 9.0 8.8*  MG  --  1.9 1.6* 1.9   GFR: Estimated Creatinine Clearance: 160.9 mL/min (by C-G formula based on SCr of 0.62 mg/dL). Liver Function Tests: Recent Labs  Lab 05/04/20 0137  AST 27  ALT 32  ALKPHOS 59  BILITOT 0.7  PROT 6.4*  ALBUMIN 3.2*   Recent Labs  Lab 05/04/20 0137  LIPASE 59*   No results for input(s): AMMONIA in the last 168 hours. Coagulation Profile: Recent Labs  Lab 05/04/20 0256  INR 1.3*   Cardiac Enzymes: No results for input(s): CKTOTAL, CKMB, CKMBINDEX, TROPONINI in the last 168 hours. BNP (last 3 results) No results for input(s): PROBNP in the last 8760 hours. HbA1C: No results for input(s): HGBA1C in the last 72 hours. CBG: Recent Labs  Lab 05/01/20 2042  GLUCAP 167*   Lipid Profile: No results for input(s): CHOL, HDL, LDLCALC, TRIG, CHOLHDL, LDLDIRECT in the last 72 hours. Thyroid Function Tests: No results for input(s): TSH, T4TOTAL, FREET4, T3FREE, THYROIDAB in the last 72 hours. Anemia Panel: No results for input(s): VITAMINB12, FOLATE, FERRITIN, TIBC, IRON, RETICCTPCT in the last 72 hours. Sepsis Labs: Recent Labs  Lab 05/04/20 0137 05/04/20 0256 05/05/20 0336  PROCALCITON <0.10  --   --   LATICACIDVEN  --  3.8* 0.6    Recent Results (from the past 240 hour(s))  Urine Culture     Status: Abnormal    Collection Time: 04/30/20  8:07 AM   Specimen: Urine, Random  Result Value Ref Range Status   Specimen Description   Final    URINE, RANDOM Performed at [facility], [address], [place], [place] [address]  Special Requests   Final    NONE Performed at [facility], [address] [place], [place] [address]    Culture >=100,000 COLONIES/mL KLEBSIELLA PNEUMONIAE (A)  Final   Report Status 05/03/2020 FINAL  Final   Organism ID, Bacteria KLEBSIELLA PNEUMONIAE (A)  Final      Susceptibility   Klebsiella pneumoniae - MIC*    AMPICILLIN >=32 RESISTANT Resistant     CEFAZOLIN <=4 SENSITIVE Sensitive     CEFEPIME <=0.12 SENSITIVE Sensitive     CEFTRIAXONE <=0.25 SENSITIVE Sensitive     CIPROFLOXACIN <=0.25 SENSITIVE Sensitive     GENTAMICIN <=1 SENSITIVE Sensitive     IMIPENEM <=0.25 SENSITIVE Sensitive     NITROFURANTOIN <=16 SENSITIVE Sensitive     TRIMETH/SULFA <=20 SENSITIVE Sensitive     AMPICILLIN/SULBACTAM 4 SENSITIVE Sensitive     PIP/TAZO 8 SENSITIVE Sensitive     * >=100,000 COLONIES/mL KLEBSIELLA PNEUMONIAE  Blood culture (routine x 2)     Status: None   Collection Time: 04/30/20  9:59 AM   Specimen: BLOOD  Result Value Ref Range Status   Specimen Description BLOOD RIGHT [facility]  Final   Special Requests   Final    BOTTLES DRAWN AEROBIC AND ANAEROBIC Blood Culture adequate volume   Culture   Final    NO GROWTH 5 DAYS Performed at [facility], [address], [place], [place] [address]    Report Status 05/05/2020 FINAL  Final  Blood culture (routine x 2)     Status: None   Collection Time: 04/30/20 10:09 AM   Specimen: BLOOD  Result Value Ref Range Status   Specimen Description BLOOD RIGHT ASSIST CONTROL  Final   Special Requests   Final    BOTTLES DRAWN AEROBIC AND ANAEROBIC Blood Culture adequate volume   Culture   Final    NO GROWTH 5 DAYS Performed at [facility], [address] [place], [place] [address]    Report  Status 05/05/2020 FINAL  Final  Urine culture     Status: Abnormal   Collection Time: 05/04/20 12:56 AM   Specimen: In/Out Cath Urine  Result Value Ref Range Status   Specimen Description   Final    IN/OUT CATH URINE Performed at [facility], [address], [place], [place] [address]    Special Requests   Final    NONE Performed at [facility], [address], [place], [place] [address]    Culture (A)  Final    <10,000 COLONIES/mL INSIGNIFICANT GROWTH Performed at [facility], [address] [place], [place] [address]    Report Status 05/05/2020 FINAL  Final  Blood culture (routine single)     Status: None (Preliminary result)   Collection Time: 05/04/20  1:37 AM   Specimen: BLOOD  Result Value Ref Range Status   Specimen Description BLOOD  RTAC  Final   Special Requests   Final    BOTTLES DRAWN AEROBIC ONLY Blood Culture results may not be optimal due to an inadequate volume of blood received in culture bottles   Culture   Final    NO GROWTH 4 DAYS Performed at [facility], [address] [place], [place] [address]    Report Status PENDING  Incomplete  C Difficile Quick Screen w PCR reflex     Status: Abnormal   Collection Time: 05/04/20  3:24 AM   Specimen: STOOL  Result Value Ref Range Status   C Diff antigen POSITIVE (A)  NEGATIVE Final   C Diff toxin NEGATIVE NEGATIVE Final   C Diff interpretation Results are indeterminate. See PCR results.  Final    Comment: Performed at [facility], [address] [place], [place] [address]  C. Diff by PCR, Reflexed     Status: Abnormal   Collection Time: 05/04/20  3:24 AM  Result Value Ref Range Status   Toxigenic C. Difficile by PCR POSITIVE (A) NEGATIVE Final    Comment: Performed at [facility], [address] [place], [place] [address]  Gastrointestinal Panel by PCR , Stool     Status: Abnormal   Collection Time: 05/04/20  3:33 AM   Specimen: STOOL  Result  Value Ref Range Status   Campylobacter species NOT DETECTED NOT DETECTED Final   Plesimonas shigelloides NOT DETECTED NOT DETECTED Final   Salmonella species NOT DETECTED NOT DETECTED Final   Yersinia enterocolitica NOT DETECTED NOT DETECTED Final   Vibrio species NOT DETECTED NOT DETECTED Final   Vibrio cholerae NOT DETECTED NOT DETECTED Final   Enteroaggregative E coli (EAEC) NOT DETECTED NOT DETECTED Final   Enteropathogenic E coli (EPEC) NOT DETECTED NOT DETECTED Final   Enterotoxigenic E coli (ETEC) NOT DETECTED NOT DETECTED Final   Shiga like toxin producing E coli (STEC) NOT DETECTED NOT DETECTED Final   Shigella/Enteroinvasive E coli (EIEC) NOT DETECTED NOT DETECTED Final   Cryptosporidium NOT DETECTED NOT DETECTED Final   Cyclospora cayetanensis NOT DETECTED NOT DETECTED Final   Entamoeba histolytica NOT DETECTED NOT DETECTED Final   Giardia lamblia NOT DETECTED NOT DETECTED Final   Adenovirus F40/41 DETECTED (A) NOT DETECTED Final   Astrovirus NOT DETECTED NOT DETECTED Final   Norovirus GI/GII NOT DETECTED NOT DETECTED Final   Rotavirus A NOT DETECTED NOT DETECTED Final   Sapovirus (I, II, IV, and V) NOT DETECTED NOT DETECTED Final    Comment: Performed at [facility], [address], [place], [place] [address]      Imaging Studies   No results found.   Medications   Scheduled Meds: . acetaminophen  1,000 mg Oral TID  . apixaban  5 mg Oral BID  . atorvastatin  40 mg Oral Daily  . carisoprodol  350 mg Oral TID  . dextromethorphan-guaiFENesin  1 tablet Oral BID  . DULoxetine  20 mg Oral Daily  . furosemide  60 mg Intravenous Daily  . levothyroxine  75 mcg Oral Q0600  . metoprolol succinate  50 mg Oral Daily  . montelukast  10 mg Oral QHS  . sodium chloride flush  3 mL Intravenous Q12H  . traZODone  150 mg Oral QHS  . vancomycin  125 mg Oral QID   Continuous Infusions: . sodium chloride         LOS: 4 days    Time spent: 25 minutes with > 50%  spent at bedside and in coordination of care.    Pennie Banter, DO [facility]  05/08/2020, 3:52 PM      If 7PM-7AM, please contact night-coverage. How to contact the [facility] Attending or Consulting provider 7A - 7P or covering provider during after hours 7P -7A, for this patient?    1. Check the care team in [facility] and look for a) attending/consulting TR[facility] provider listed and b) the [facility] team listed 2. Log into [email or web address] and use [place]'s universal password to access. If you do not have the password, please contact the hospital operator. 3. Locate the [facility] provider you are looking for  under [facility] and page to a number that you can be directly reached. 4. If you still have difficulty reaching the provider, please page the [facility] (Director on Call) for the Hospitalists listed on amion for assistance.

## 2020-05-08 NOTE — Progress Notes (Signed)
PT Cancellation Note  Patient Details Name: [name] [name] MRN: [id number] DOB: 1960/05/01   Cancelled Treatment:    Reason Eval/Treat Not Completed: Patient declined to participate with PT services this date secondary to back pain but declined offer to see if nursing could get him anything for pain, nursing notifed.  Will attempt to see pt at a future date/time as medically appropriate.     Ovidio Hanger PT, DPT 05/08/20, 4:54 PM

## 2020-05-09 DIAGNOSIS — I5033 Acute on chronic diastolic (congestive) heart failure: Secondary | ICD-10-CM | POA: Diagnosis not present

## 2020-05-09 DIAGNOSIS — A0472 Enterocolitis due to Clostridium difficile, not specified as recurrent: Secondary | ICD-10-CM | POA: Diagnosis not present

## 2020-05-09 DIAGNOSIS — I872 Venous insufficiency (chronic) (peripheral): Secondary | ICD-10-CM | POA: Diagnosis not present

## 2020-05-09 DIAGNOSIS — J9622 Acute and chronic respiratory failure with hypercapnia: Secondary | ICD-10-CM | POA: Diagnosis not present

## 2020-05-09 DIAGNOSIS — J9621 Acute and chronic respiratory failure with hypoxia: Secondary | ICD-10-CM | POA: Diagnosis not present

## 2020-05-09 DIAGNOSIS — G4733 Obstructive sleep apnea (adult) (pediatric): Secondary | ICD-10-CM | POA: Diagnosis not present

## 2020-05-09 DIAGNOSIS — Z789 Other specified health status: Secondary | ICD-10-CM | POA: Diagnosis not present

## 2020-05-09 DIAGNOSIS — I482 Chronic atrial fibrillation, unspecified: Secondary | ICD-10-CM | POA: Diagnosis not present

## 2020-05-09 LAB — CBC
HCT: 31.5 % — ABNORMAL LOW (ref 39.0–52.0)
Hemoglobin: 10.1 g/dL — ABNORMAL LOW (ref 13.0–17.0)
MCH: 32.6 pg (ref 26.0–34.0)
MCHC: 32.1 g/dL (ref 30.0–36.0)
MCV: 101.6 fL — ABNORMAL HIGH (ref 80.0–100.0)
Platelets: 218 10*3/uL (ref 150–400)
RBC: 3.1 MIL/uL — ABNORMAL LOW (ref 4.22–5.81)
RDW: 16.4 % — ABNORMAL HIGH (ref 11.5–15.5)
WBC: 4.1 10*3/uL (ref 4.0–10.5)
nRBC: 0 % (ref 0.0–0.2)

## 2020-05-09 LAB — CULTURE, BLOOD (SINGLE): Culture: NO GROWTH

## 2020-05-09 LAB — BASIC METABOLIC PANEL
Anion gap: 9 (ref 5–15)
BUN: 12 mg/dL (ref 6–20)
CO2: 30 mmol/L (ref 22–32)
Calcium: 8.6 mg/dL — ABNORMAL LOW (ref 8.9–10.3)
Chloride: 95 mmol/L — ABNORMAL LOW (ref 98–111)
Creatinine, Ser: 0.69 mg/dL (ref 0.61–1.24)
GFR, Estimated: >60 mL/min (ref >60)
Glucose, Bld: 114 mg/dL — ABNORMAL HIGH (ref 70–99)
Potassium: 4.7 mmol/L (ref 3.5–5.1)
Sodium: 134 mmol/L — ABNORMAL LOW (ref 135–145)

## 2020-05-09 LAB — MAGNESIUM: Magnesium: 1.9 mg/dL (ref 1.7–2.4)

## 2020-05-09 MED ORDER — FUROSEMIDE 10 MG/ML IJ SOLN
60.0000 mg | Freq: Two times a day (BID) | INTRAMUSCULAR | Status: DC
  Administered 2020-05-09 – 2020-05-13 (×9): 60 mg via INTRAVENOUS
  Filled 2020-05-09 (×10): qty 8

## 2020-05-09 NOTE — Progress Notes (Signed)
Physical Therapy Treatment Patient Details Name: [name] [name] MRN: [id number] DOB: [birth year] Today's Date: 05/09/2020    History of Present Illness 60 y.o. male with medical history significant for Class III obesity, BMI over 60, depression, HFrEF with last EF 30 to 35%, COPD, OSA chronic respiratory failure on home O2 at 3 L and on CPAP, A. fib and history of DVT on Eliquis, CAD recently hospitalized from 2/22 - 3/14, then 3/ with complications of Covid pneumonia requiring intubation, rehospitalized from 3/16-3/18, who returns to the emergency room with complaints of protracted weakness, inability to manage at home as well as shortness of breath and orthopnea and increased lower extremity edema.  He denies chest pain, cough fever or chills.  He also has diarrhea but denies abdominal pain, nausea or vomiting.  Patient, who lives alone was apparently discharged on 3/18 after he declined skilled rehab placement.  He states that he would really like to go to rehab but is unwilling to give up his check because he has no other way to pay his bills.    PT Comments    Pt put forth good effort during the session but ultimately fatigued quickly requiring frequent therapeutic rest breaks during the session.  Pt was able to stand and ambulate with a RW without physical assistance but max amb distance was grossly 10 feet with HR up to 122 bpm and SpO2 in the low 90s.  No adverse symptoms noted during the session other than baseline HA present upon entering room, nsg notifed.  Pt will benefit from PT services in a SNF setting upon discharge to safely address deficits listed in patient problem list for decreased caregiver assistance and eventual return to PLOF.    Follow Up Recommendations  SNF     Equipment Recommendations  None recommended by PT    Recommendations for Other Services       Precautions / Restrictions Precautions Precautions: Fall Precaution Comments: Watch Rt  shoulder Restrictions Weight Bearing Restrictions: No    Mobility  Bed Mobility               General bed mobility comments: NT, pt in recliner    Transfers Overall transfer level: Needs assistance Equipment used: Rolling walker (2 wheeled) Transfers: Sit to/from Stand Sit to Stand: Min guard         General transfer comment: Fair eccentric and concentric control with pt requiring BUE assist on armrests to stand  Ambulation/Gait Ambulation/Gait assistance: Min guard Gait Distance (Feet): 10 Feet x 2 Assistive device: Rolling walker (2 wheeled) Gait Pattern/deviations: Step-through pattern Gait velocity: decreased   General Gait Details: Slow cadence with short B steps length but steady without LOB with mod lean on the RW for support.  Max HR up to 122 after amb with SpO2 in the low 90s on [place].   Stairs             Wheelchair Mobility    Modified Rankin (Stroke Patients Only)       Balance Overall balance assessment: Needs assistance Sitting-balance support: Feet supported Sitting balance-Leahy Scale: Good     Standing balance support: Bilateral upper extremity supported;During functional activity Standing balance-Leahy Scale: Fair Standing balance comment: Forward lean with mod support from the RW                            Cognition Arousal/Alertness: Awake/alert Behavior During Therapy: [facility] for tasks assessed/performed Overall Cognitive Status: Within Functional  Limits for tasks assessed                                        Exercises Total Joint Exercises Ankle Circles/Pumps: AROM;Strengthening;Both;10 reps (with resistance) Towel Squeeze: Strengthening;Both;10 reps Long Arc Quad: Strengthening;Both;10 reps;5 reps (with manual resistance) Knee Flexion: Strengthening;Both;5 reps;10 reps (with manual resistance) Marching in Standing: AROM;Strengthening;Both;10 reps;Standing;5 reps    General Comments         Pertinent Vitals/Pain Pain Assessment: 0-10 Pain Score: 10-Worst pain ever Pain Location: HA Pain Descriptors / Indicators: Headache Pain Intervention(s): Monitored during session;Patient requesting pain meds-RN notified    Home Living                      Prior Function            PT Goals (current goals can now be found in the care plan section) Progress towards PT goals: Progressing toward goals    Frequency    Min 2X/week      PT Plan Current plan remains appropriate    Co-evaluation              AM-PAC PT "6 Clicks" Mobility   Outcome Measure  Help needed turning from your back to your side while in a flat bed without using bedrails?: A Little Help needed moving from lying on your back to sitting on the side of a flat bed without using bedrails?: A Little Help needed moving to and from a bed to a chair (including a wheelchair)?: A Little Help needed standing up from a chair using your arms (e.g., wheelchair or bedside chair)?: A Little Help needed to walk in hospital room?: A Lot Help needed climbing 3-5 steps with a railing? : Total 6 Click Score: 15    End of Session Equipment Utilized During Treatment: Oxygen Activity Tolerance: Patient limited by fatigue Patient left: in chair;with call bell/phone within reach Nurse Communication: Mobility status PT Visit Diagnosis: Other abnormalities of gait and mobility (R26.89);Muscle weakness (generalized) (M62.81);Difficulty in walking, not elsewhere classified (R26.2);Unsteadiness on feet (R26.81)     Time: 6812-7517 PT Time Calculation (min) (ACUTE ONLY): 23 min  Charges:  $Gait Training: 8-22 mins $Therapeutic Exercise: 8-22 mins                     D. Scott Kathrene Sinopoli PT, DPT 05/09/20, 1:46 PM

## 2020-05-09 NOTE — Progress Notes (Addendum)
PROGRESS NOTE    [name] [name]   FVC:[id number]  DOB: April 24, 1960  PCP: [name], DO    DOA: 05/04/2020 LOS: 6   Brief Narrative   [name] Fallen Hubbardis a 60 y.o.malewith medical history significant forClass III obesity, BMI over 60, depression, HFrEF with last EF 30 to 35%, COPD, OSA chronic respiratory failure on home O2 at 3 L and on CPAP, A. fib and history of DVT onEliquis, CAD recently hospitalized from 2/22 - 3/14 with complications of Covid pneumonia requiring intubation, rehospitalized from 3/16-3/18, who returns to the emergency room with complaints of protracted weakness, inability to manage at home.    Patient, who lives alone was apparently discharged on 3/18 after he declined skilled rehab placement. He states that he would really like to go to rehab but is unwilling to give up his check because he has no other way to pay his bills.  Patient had called EMS multiple times since being DC from hospital on 3/18 needing assistance of moving to and from bed to chair and needing assistance with toilet and hygiene.  Patient is unable to care for self at home alone.  Assessment & Plan   Principal Problem:   Acute on chronic respiratory failure with hypoxia and hypercapnia (HCC) Active Problems:   Sepsis (HCC)   Acute on chronic diastolic CHF (congestive heart failure) (HCC)   Obstructive sleep apnea   Chronic atrial fibrillation (HCC)   HTN (hypertension)   Morbid obesity with BMI of 60.0-69.9, adult (HCC)   CAD (coronary artery disease)   Hypothyroidism   Chronic venous stasis dermatitis of both lower extremities   Chronic anticoagulation (warfarin  COUMADIN)   COPD (chronic obstructive pulmonary disease) (HCC)   Dyspnea and respiratory abnormality   Clostridium difficile diarrhea   Elevated lactic acid level   Unable to care for self   Chronic respiratory failure with hypoxia and hypercapnia on 2-3L home O2 COPD OSA on CPAP nightly --pt has  chronic dyspnea and BLE edema.  BNP actually better than baseline on presentation.  PCO2 61 up from baseline of 50-55 though with normal pH, and could be due to missing his nightly CPAP. --on presentation, sating 100% on home 3L O2. Plan: --cont home 2-3L supplemental O2 --cont CPAP while sleeping  Lower extremity edema /lymphedema -is followed by wound care clinic for Unna boots.   WOC consulted appreciate recommendations: "Wound care to bilateral LEs: Cleanse with soap and water, rinse and pat dry. Cover left lateral LE full thickness wound and right LE anterior partial thickness lesions with size appropriate pieces of silver hydrofiber (Aquacel Advantage, Lawson # [id number]). Top with ABD pads. Secure by wrapping from just below toes to just below knees with Kerlix roll gauze, top with 4-inch Coban (self-adherent wrap) applied in a similar manner. Attempt to elevate LEs while patient is sitting in the chair at intervals (example of goal would be for 1 hour every 4 hours)." --Diuresis as below  Diarrhea, improved Possible Clostridium difficile  Adenovirus F40/41 gastroenteritis -Patient noted to have frequent watery stools in the ER, which started after presentation. -C. difficile antigen positive, and PCR positive.  No fever, no leukocytosis, but since having diarrhea, oral vanc started on presentation.  Diarrhea has since improved. --GI path returned positive for Adenovirus F40/41. Plan: --cont oral vanc for a 10-day course, per ID rec  Sepsis ruled out --No fever, no leukocytosis, procal neg.    Lactic acidosis, resolved --3.8 on admission.  Unclear etiology. --resolved with NS@50   for 10 hours  Chronic diastolic CHF -fairly stable and compensated.  Does have increasing lower extremity edema however.  Appears by med history that he may not been taking his torsemide.  Has not been on diuretic yet here. --Increase Lasix 60 mg IV daily to BID --Monitor I/O's, weights   Chronic  anticoagulation Chronic atrial fibrillation (HCC) History of DVT --cont home Toprol -Continue Eliquis  Morbid obesity with BMI of 60.0-69.9,with physical deconditioning Unable to care for self Body mass index is 62.45 kg/m. --TOC to work on placement to SNF --PT/OT while inpatient --requested air mattress to off-load pressure  Chronic venous stasis dermatitis both lower extremities --edema and erythema equal bilaterally, does not have infection.  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 --resumed on prior home regimen  --cont home Soma --Tylenol 1g TID scheduled for 5 days --Naproxen PRN for pain  Right shoulder pain, POA Probable rotator cuff strain --started a couple of week ago while pt was trying to pull himself up in bed.  Xray no acute finding.  MRI shoulder could not be performed due to him not fitting in the MRI machine. --Ortho was consulted and did joint injection 3/22 --Pain control as above    DVT prophylaxis:  apixaban (ELIQUIS) tablet 5 mg   Diet:  Diet Orders (From admission, onward)    Start     Ordered   05/05/20 1354  Diet Heart Room service appropriate? Yes; Fluid consistency: Thin  Diet effective now       Question Answer Comment  Room service appropriate? Yes   Fluid consistency: Thin      05/05/20 1353            Code Status: Full Code    Subjective 05/10/20    Pt still has headache.  Reports episodes of facial flushing and head getting hot.  No fevers or chills.  No apparent trigger for this.     Disposition Plan & Communication   Status is: Inpatient  Inpatient appropriate due to unsafe d/c, requires SNF, liikely long term placement.  Dispo: The patient is  from: Home              Anticipated d/c is to: SNF              Patient currently is medically stable for d/c   Difficult to place patient Yes     Consults, Procedures, Significant Events   Consultants:     Procedures:   Shoulder injection  Antimicrobials:  Anti-infectives (From admission, onward)   Start     Dose/Rate Route Frequency Ordered Stop   05/04/20 1000  vancomycin (VANCOCIN) 50 mg/mL oral solution 125 mg        125 mg Oral 4 times daily 05/04/20 0514 05/14/20 0959        Micro    Objective   Vitals:   05/09/20 1454 05/09/20 1923 05/10/20 0021 05/10/20 0414  BP: (!) 152/78 134/83 (!) 110/56 106/69  Pulse: 91 91 73 85  Resp: 18 20  20   Temp: 98.5 F (36.9 C) 98.3 F (36.8 C) 98.2 F (36.8 C) 98.6 F (37 C)  TempSrc: Oral Oral Oral Oral  SpO2: 97% 98% 99% 98%  Weight:    (!) 186.3 kg  Height:  Intake/Output Summary (Last 24 hours) at 05/10/2020 0836 Last data filed at 05/10/2020 0455 Gross per 24 hour  Intake --  Output 1500 ml  Net -1500 ml   Filed Weights   05/08/20 0500 05/09/20 0500 05/10/20 0414  Weight: (!) 183.5 kg (!) 187.5 kg (!) 186.3 kg    Physical Exam:  General exam: awake, alert, no acute distress, morbidly obese, appears fatigued HEENT: hypertonic cervical paraspinal muscles L>R, facial flushing transient Respiratory system: diminished due to body habitus, normal respiratory effort. Cardiovascular system: normal S1/S2, RRR, severe b/l LE edema.   Central nervous system: A&O x3. no gross focal neurologic deficits, normal speech Extremities: lower leg wraps in place with proximal edema, chronic venous stasis  Psychiatry: normal mood, congruent affect, judgement and insight appear normal  Labs   Data Reviewed: I have personally reviewed following labs and imaging studies  CBC: Recent Labs  Lab 05/04/20 0137 05/05/20 0445 05/06/20 0625 05/07/20 0626 05/09/20 0634  WBC 4.9 3.8* 3.6* 4.0 4.1  NEUTROABS 2.4  --   --    --   --   HGB 10.6* 9.7* 10.2* 10.2* 10.1*  HCT 32.8* 30.7* 31.2* 30.9* 31.5*  MCV 99.4 100.7* 100.0 98.7 101.6*  PLT 195 165 180 226 218   Basic Metabolic Panel: Recent Labs  Lab 05/04/20 0137 05/05/20 0336 05/06/20 0625 05/07/20 0626 05/09/20 0634  NA 132* 133* 137 135 134*  K 3.6 4.1 4.1 4.9 4.7  CL 88* 102 94* 95* 95*  CO2 30 24 37* 33* 30  GLUCOSE 133* 107* 137* 131* 114*  BUN 14 16 10 10 12   CREATININE 0.86 1.01 0.66 0.62 0.69  CALCIUM 8.6* 8.1* 9.0 8.8* 8.6*  MG  --  1.9 1.6* 1.9 1.9   GFR: Estimated Creatinine Clearance: 162.6 mL/min (by C-G formula based on SCr of 0.69 mg/dL). Liver Function Tests: Recent Labs  Lab 05/04/20 0137  AST 27  ALT 32  ALKPHOS 59  BILITOT 0.7  PROT 6.4*  ALBUMIN 3.2*   Recent Labs  Lab 05/04/20 0137  LIPASE 59*   No results for input(s): AMMONIA in the last 168 hours. Coagulation Profile: Recent Labs  Lab 05/04/20 0256  INR 1.3*   Cardiac Enzymes: No results for input(s): CKTOTAL, CKMB, CKMBINDEX, TROPONINI in the last 168 hours. BNP (last 3 results) No results for input(s): PROBNP in the last 8760 hours. HbA1C: No results for input(s): HGBA1C in the last 72 hours. CBG: No results for input(s): GLUCAP in the last 168 hours. Lipid Profile: No results for input(s): CHOL, HDL, LDLCALC, TRIG, CHOLHDL, LDLDIRECT in the last 72 hours. Thyroid Function Tests: No results for input(s): TSH, T4TOTAL, FREET4, T3FREE, THYROIDAB in the last 72 hours. Anemia Panel: No results for input(s): VITAMINB12, FOLATE, FERRITIN, TIBC, IRON, RETICCTPCT in the last 72 hours. Sepsis Labs: Recent Labs  Lab 05/04/20 0137 05/04/20 0256 05/05/20 0336  PROCALCITON <0.10  --   --   LATICACIDVEN  --  3.8* 0.6    Recent Results (from the past 240 hour(s))  Blood culture (routine x 2)     Status: None   Collection Time: 04/30/20  9:59 AM   Specimen: BLOOD  Result Value Ref Range Status   Specimen Description BLOOD RIGHT [facility]  Final    Special Requests   Final    BOTTLES DRAWN AEROBIC AND ANAEROBIC Blood Culture adequate volume   Culture   Final    NO GROWTH 5 DAYS Performed at [facility], [address] [place], [place] [address]    Report Status 05/05/2020 FINAL  Final  Blood culture (routine x 2)     Status: None   Collection Time: 04/30/20 10:09 AM   Specimen: BLOOD  Result Value Ref Range Status   Specimen Description BLOOD RIGHT ASSIST CONTROL  Final   Special Requests   Final    BOTTLES DRAWN AEROBIC AND ANAEROBIC Blood Culture adequate volume   Culture   Final    NO GROWTH 5 DAYS Performed at [facility], [address] [place], [place] [address]    Report Status 05/05/2020 FINAL  Final  Urine culture     Status: Abnormal   Collection Time: 05/04/20 12:56 AM   Specimen: In/Out Cath Urine  Result Value Ref Range Status   Specimen Description   Final    IN/OUT CATH URINE Performed at [facility], [address] [place], [place] [address]    Special Requests   Final    NONE Performed at [facility], [address], [place], [place] [address]    Culture (A)  Final    <10,000 COLONIES/mL INSIGNIFICANT GROWTH Performed at [facility], [address] [place], [place] [address]    Report Status 05/05/2020 FINAL  Final  Blood culture (routine single)     Status: None   Collection Time: 05/04/20  1:37 AM   Specimen: BLOOD  Result Value Ref Range Status   Specimen Description BLOOD  RTAC  Final   Special Requests   Final    BOTTLES DRAWN AEROBIC ONLY Blood Culture results may not be optimal due to an inadequate volume of blood received in culture bottles   Culture   Final    NO GROWTH 5 DAYS Performed at [facility], [address] [place], [place] [address]    Report Status 05/09/2020 FINAL  Final  C Difficile Quick Screen w PCR reflex     Status: Abnormal   Collection Time: 05/04/20  3:24 AM   Specimen: STOOL  Result Value Ref  Range Status   C Diff antigen POSITIVE (A) NEGATIVE Final   C Diff toxin NEGATIVE NEGATIVE Final   C Diff interpretation Results are indeterminate. See PCR results.  Final    Comment: Performed at [facility], [address] [place], [place] [address]  C. Diff by PCR, Reflexed     Status: Abnormal   Collection Time: 05/04/20  3:24 AM  Result Value Ref Range Status   Toxigenic C. Difficile by PCR POSITIVE (A) NEGATIVE Final    Comment: Performed at [facility], [address] [place], [place] [address]  Gastrointestinal Panel by PCR , Stool     Status: Abnormal   Collection Time: 05/04/20  3:33 AM   Specimen: STOOL  Result Value Ref Range Status   Campylobacter species NOT DETECTED NOT DETECTED Final   Plesimonas shigelloides NOT DETECTED NOT DETECTED Final   Salmonella species NOT DETECTED NOT DETECTED Final   Yersinia enterocolitica NOT DETECTED NOT DETECTED Final   Vibrio species NOT DETECTED NOT DETECTED Final   Vibrio cholerae NOT DETECTED NOT DETECTED Final   Enteroaggregative E coli (EAEC) NOT DETECTED NOT DETECTED Final   Enteropathogenic E coli (EPEC) NOT DETECTED NOT DETECTED Final   Enterotoxigenic E coli (ETEC) NOT DETECTED NOT DETECTED Final   Shiga like toxin producing E coli (STEC) NOT DETECTED NOT DETECTED Final   Shigella/Enteroinvasive E coli (EIEC) NOT DETECTED NOT DETECTED Final   Cryptosporidium  NOT DETECTED NOT DETECTED Final   Cyclospora cayetanensis NOT DETECTED NOT DETECTED Final   Entamoeba histolytica NOT DETECTED NOT DETECTED Final   Giardia lamblia NOT DETECTED NOT DETECTED Final   Adenovirus F40/41 DETECTED (A) NOT DETECTED Final   Astrovirus NOT DETECTED NOT DETECTED Final   Norovirus GI/GII NOT DETECTED NOT DETECTED Final   Rotavirus A NOT DETECTED NOT DETECTED Final   Sapovirus (I, II, IV, and V) NOT DETECTED NOT DETECTED Final    Comment: Performed at [facility], [address], [place], [place] [address]       Imaging Studies   No results found.   Medications   Scheduled Meds: . acetaminophen  1,000 mg Oral TID  . apixaban  5 mg Oral BID  . atorvastatin  40 mg Oral Daily  . carisoprodol  350 mg Oral TID  . dextromethorphan-guaiFENesin  1 tablet Oral BID  . DULoxetine  20 mg Oral Daily  . furosemide  60 mg Intravenous BID  . levothyroxine  75 mcg Oral Q0600  . metoprolol succinate  50 mg Oral Daily  . montelukast  10 mg Oral QHS  . sodium chloride flush  3 mL Intravenous Q12H  . traZODone  150 mg Oral QHS  . vancomycin  125 mg Oral QID   Continuous Infusions: . sodium chloride         LOS: 6 days    Time spent: 25 minutes with > 50% spent at bedside and in coordination of care.    Pennie Banter, DO [facility]  05/10/2020, 8:36 AM      If 7PM-7AM, please contact night-coverage. How to contact the [facility] Attending or Consulting provider 7A - 7P or covering provider during after hours 7P -7A, for this patient?    1. Check the care team in [facility] and look for a) attending/consulting [facility] provider listed and b) the [facility] team listed 2. Log into [email or web address] and use [facility]'s universal password to access. If you do not have the password, please contact the hospital operator. 3. Locate the [facility] provider you are looking for under [facility] and page to a number that you can be directly reached. 4. If you still have difficulty reaching the provider, please page the [facility] (Director on Call) for the Hospitalists listed on amion for assistance.

## 2020-05-09 NOTE — TOC Progression Note (Signed)
Transition of Care [facility]) - Progression Note    Patient Details  Name: [name] [name] MRN: [id number] Date of Birth: 1960-08-27  Transition of Care [facility] CM/SW Contact  Maree Krabbe, LCSW Phone Number: 05/09/2020, 12:46 PM  Clinical Narrative:  Pt still has no bed offers.          Expected Discharge Plan and Services                                                 Social Determinants of Health (SDOH) Interventions    Readmission Risk Interventions Readmission Risk Prevention Plan 01/11/2020 12/24/2019 01/25/2019  Transportation Screening Complete Complete Complete  PCP or Specialist Appt within 3-5 Days - - -  HRI or Home Care Consult - - -  Social Work Consult for Recovery Care Planning/Counseling - - -  Palliative Care Screening - - -  Medication Review [employer]) Complete Complete Complete  PCP or Specialist appointment within 3-5 days of discharge Complete Complete Complete  HRI or Home Care Consult - - Complete  SW Recovery Care/Counseling Consult Complete Complete Complete  Palliative Care Screening Complete Not Applicable Not Applicable  Skilled Nursing Facility Not Applicable Not Applicable Not Applicable

## 2020-05-10 DIAGNOSIS — A0472 Enterocolitis due to Clostridium difficile, not specified as recurrent: Secondary | ICD-10-CM | POA: Diagnosis not present

## 2020-05-10 DIAGNOSIS — G4733 Obstructive sleep apnea (adult) (pediatric): Secondary | ICD-10-CM | POA: Diagnosis not present

## 2020-05-10 DIAGNOSIS — J9622 Acute and chronic respiratory failure with hypercapnia: Secondary | ICD-10-CM | POA: Diagnosis not present

## 2020-05-10 DIAGNOSIS — I5033 Acute on chronic diastolic (congestive) heart failure: Secondary | ICD-10-CM | POA: Diagnosis not present

## 2020-05-10 DIAGNOSIS — Z789 Other specified health status: Secondary | ICD-10-CM | POA: Diagnosis not present

## 2020-05-10 DIAGNOSIS — I482 Chronic atrial fibrillation, unspecified: Secondary | ICD-10-CM | POA: Diagnosis not present

## 2020-05-10 DIAGNOSIS — J9621 Acute and chronic respiratory failure with hypoxia: Secondary | ICD-10-CM | POA: Diagnosis not present

## 2020-05-10 DIAGNOSIS — I872 Venous insufficiency (chronic) (peripheral): Secondary | ICD-10-CM | POA: Diagnosis not present

## 2020-05-10 MED ORDER — DICLOFENAC SODIUM 1 % EX GEL
4.0000 g | Freq: Four times a day (QID) | CUTANEOUS | Status: DC
  Administered 2020-05-10 – 2020-05-16 (×21): 4 g via TOPICAL
  Filled 2020-05-10 (×2): qty 100

## 2020-05-10 NOTE — Progress Notes (Addendum)
PROGRESS NOTE    [name] [name]   SJG:[id number]  DOB: 03/27/1960  PCP: [name], DO    DOA: 05/04/2020 LOS: 6   Brief Narrative   [name] [name] a 60 y.o.malewith medical history significant forClass III obesity, BMI over 60, depression, HFrEF with last EF 30 to 35%, COPD, OSA chronic respiratory failure on home O2 at 3 L and on CPAP, A. fib and history of DVT onEliquis, CAD recently hospitalized from 2/22 - 3/14 with complications of Covid pneumonia requiring intubation, rehospitalized from 3/16-3/18, who returns to the emergency room with complaints of protracted weakness, inability to manage at home.    Patient, who lives alone was apparently discharged on 3/18 after he declined skilled rehab placement. He states that he would really like to go to rehab but is unwilling to give up his check because he has no other way to pay his bills.  Patient had called EMS multiple times since being DC from hospital on 3/18 needing assistance of moving to and from bed to chair and needing assistance with toilet and hygiene.  Patient is unable to care for self at home alone.  Assessment & Plan   Principal Problem:   Acute on chronic respiratory failure with hypoxia and hypercapnia (HCC) Active Problems:   Sepsis (HCC)   Acute on chronic diastolic CHF (congestive heart failure) (HCC)   Obstructive sleep apnea   Chronic atrial fibrillation (HCC)   HTN (hypertension)   Morbid obesity with BMI of 60.0-69.9, adult (HCC)   CAD (coronary artery disease)   Hypothyroidism   Chronic venous stasis dermatitis of both lower extremities   Chronic anticoagulation (warfarin  COUMADIN)   COPD (chronic obstructive pulmonary disease) (HCC)   Dyspnea and respiratory abnormality   Clostridium difficile diarrhea   Elevated lactic acid level   Unable to care for self   Chronic respiratory failure with hypoxia and hypercapnia on 2-3L home O2 COPD OSA on CPAP nightly Pt has chronic  dyspnea and BLE edema On presentation, sating 100% on home 3L O2. --cont home 2-3L supplemental O2 --cont CPAP while sleeping  Lower extremity edema /lymphedema -is followed by wound care clinic for Unna boots.   WOC consulted appreciate recommendations: "Wound care to bilateral LEs: Cleanse with soap and water, rinse and pat dry. Cover left lateral LE full thickness wound and right LE anterior partial thickness lesions with size appropriate pieces of silver hydrofiber (Aquacel Advantage, [employer] # [id number]). Top with ABD pads. Secure by wrapping from just below toes to just below knees with Kerlix roll gauze, top with 4-inch Coban (self-adherent wrap) applied in a similar manner. Attempt to elevate LEs while patient is sitting in the chair at intervals (example of goal would be for 1 hour every 4 hours)." --Diuresis as below --Unna boots   Acute on Chronic diastolic CHF - Pt with significantly increased edema.  Appears by med history that he may not been taking his torsemide.  Started on IV Lasix and having good response.  Continues to have good outputs and edema slowly beginning to improve. --continue Lasix 60 mg IV daily to BID --Monitor I/O's, weights --Low sodium diet, fluid restriction  Diarrhea, improved Possible Clostridium difficile  Adenovirus F40/41 gastroenteritis -Patient noted to have frequent watery stools in the ER, which started after presentation. -C. difficile antigen positive, and PCR positive.  No fever, no leukocytosis, but since having diarrhea, oral vanc started on presentation.  Diarrhea has since improved. --GI path returned positive for Adenovirus F40/41. Plan: --  cont oral vanc for a 10-day course, per ID rec  Sepsis ruled out --No fever, no leukocytosis, procal neg.    Lactic acidosis, resolved --3.8 on admission.  Unclear etiology. --resolved with NS@50  for 10 hours  Chronic anticoagulation Chronic atrial fibrillation (HCC) History of DVT --cont  home Toprol -Continue Eliquis  Morbid obesity with BMI of 60.0-69.9,with physical deconditioning Unable to care for self Body mass index is 62.45 kg/m. --TOC to work on placement to SNF --PT/OT while inpatient --requested air mattress to off-load pressure  Chronic venous stasis dermatitis both lower extremities --edema and erythema equal bilaterally, does not have infection.  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 --resumed on prior home regimen  --cont home Soma --Tylenol 1g TID scheduled for 5 days --Naproxen PRN for pain  Right shoulder pain, POA Probable rotator cuff strain --started a couple of week ago while pt was trying to pull himself up in bed.  Xray no acute finding.  MRI shoulder could not be performed due to him not fitting in the MRI machine. --Ortho was consulted and did joint injection 3/22 --Pain control as above --Voltaren gel topical    DVT prophylaxis:  apixaban (ELIQUIS) tablet 5 mg   Diet:  Diet Orders (From admission, onward)    Start     Ordered   05/05/20 1354  Diet Heart Room service appropriate? Yes; Fluid consistency: Thin  Diet effective now       Question Answer Comment  Room service appropriate? Yes   Fluid consistency: Thin      05/05/20 1353            Code Status: Full Code    Subjective 05/10/20    Pt say headache finally resolved, lasted 4 days.  Still having a lot of shoulder pain despite the injection several days ago.  Says Voltaren gel had been helping before and requests it.  Asks for podiatry consult to trim his toe nails.     Disposition Plan & Communication   Status is: Inpatient  Inpatient appropriate due to requiring IV diuresis as above due  to volume overload.  Requires SNF placement.  Dispo: The patient is from: Home              Anticipated d/c is to: SNF              Patient currently is NOT medically stable for d/c   Difficult to place patient Yes     Consults, Procedures, Significant Events   Consultants:     Procedures:   Shoulder injection  Antimicrobials:  Anti-infectives (From admission, onward)   Start     Dose/Rate Route Frequency Ordered Stop   05/04/20 1000  vancomycin (VANCOCIN) 50 mg/mL oral solution 125 mg        125 mg Oral 4 times daily 05/04/20 0514 05/14/20 0959        Micro    Objective   Vitals:   05/10/20 0021 05/10/20 0414 05/10/20 0927 05/10/20 1153  BP: (!) 110/56 106/69 (!) 142/81 115/63  Pulse: 73 85 88 65  Resp:  20  17  Temp: 98.2 F (36.8 C) 98.6 F (37 C) 98 F (36.7 C) 98.7 F (37.1 C)  TempSrc: Oral Oral Oral Oral  SpO2: 99% 98% 99% 98%  Weight:  (!) 186.3 kg    Height:        Intake/Output Summary (Last 24 hours) at 05/10/2020 1618 Last data filed at 05/10/2020 1039 Gross per 24 hour  Intake 480 ml  Output 1800 ml  Net -1320 ml   Filed Weights   05/08/20 0500 05/09/20 0500 05/10/20 0414  Weight: (!) 183.5 kg (!) 187.5 kg (!) 186.3 kg    Physical Exam:  General exam:up in recliner, awake, alert, no acute distress, morbidly obese Respiratory system: normal respiratory effort, symmetric chest rise, . Cardiovascular system: RRR, severe b/l LE edema overall stable.   Central nervous system: A&O x3. no gross focal neurologic deficits, normal speech Extremities: lower leg wraps removed, chronic venous stasis and severe edema, overgrown toe nails with onychomycosis   Labs   Data Reviewed: I have personally reviewed following labs and imaging studies  CBC: Recent Labs  Lab 05/04/20 0137 05/05/20 0445 05/06/20 0625 05/07/20 0626 05/09/20 0634  WBC 4.9 3.8* 3.6* 4.0 4.1  NEUTROABS 2.4  --   --   --   --   HGB 10.6* 9.7* 10.2* 10.2* 10.1*  HCT  32.8* 30.7* 31.2* 30.9* 31.5*  MCV 99.4 100.7* 100.0 98.7 101.6*  PLT 195 165 180 226 218   Basic Metabolic Panel: Recent Labs  Lab 05/04/20 0137 05/05/20 0336 05/06/20 0625 05/07/20 0626 05/09/20 0634  NA 132* 133* 137 135 134*  K 3.6 4.1 4.1 4.9 4.7  CL 88* 102 94* 95* 95*  CO2 30 24 37* 33* 30  GLUCOSE 133* 107* 137* 131* 114*  BUN 14 16 10 10 12   CREATININE 0.86 1.01 0.66 0.62 0.69  CALCIUM 8.6* 8.1* 9.0 8.8* 8.6*  MG  --  1.9 1.6* 1.9 1.9   GFR: Estimated Creatinine Clearance: 162.6 mL/min (by C-G formula based on SCr of 0.69 mg/dL). Liver Function Tests: Recent Labs  Lab 05/04/20 0137  AST 27  ALT 32  ALKPHOS 59  BILITOT 0.7  PROT 6.4*  ALBUMIN 3.2*   Recent Labs  Lab 05/04/20 0137  LIPASE 59*   No results for input(s): AMMONIA in the last 168 hours. Coagulation Profile: Recent Labs  Lab 05/04/20 0256  INR 1.3*   Cardiac Enzymes: No results for input(s): CKTOTAL, CKMB, CKMBINDEX, TROPONINI in the last 168 hours. BNP (last 3 results) No results for input(s): PROBNP in the last 8760 hours. HbA1C: No results for input(s): HGBA1C in the last 72 hours. CBG: No results for input(s): GLUCAP in the last 168 hours. Lipid Profile: No results for input(s): CHOL, HDL, LDLCALC, TRIG, CHOLHDL, LDLDIRECT in the last 72 hours. Thyroid Function Tests: No results for input(s): TSH, T4TOTAL, FREET4, T3FREE, THYROIDAB in the last 72 hours. Anemia Panel: No results for input(s): VITAMINB12, FOLATE, FERRITIN, TIBC, IRON, RETICCTPCT in the last 72 hours. Sepsis Labs: Recent Labs  Lab 05/04/20 0137 05/04/20 0256 05/05/20 0336  PROCALCITON <0.10  --   --   LATICACIDVEN  --  3.8* 0.6    Recent Results (from the past 240 hour(s))  Urine culture     Status: Abnormal   Collection Time: 05/04/20 12:56 AM   Specimen: In/Out Cath Urine  Result Value Ref Range Status   Specimen Description   Final    IN/OUT CATH URINE Performed at [facility], [address] [place], [place]    Special Requests   Final    NONE Performed at [facility], [address] [place], [place]  Culture (A)  Final    <10,000 COLONIES/mL INSIGNIFICANT GROWTH Performed at [facility], [address], [place], [place] [address]    Report Status 05/05/2020 FINAL  Final  Blood culture (routine single)     Status: None   Collection Time: 05/04/20  1:37 AM   Specimen: BLOOD  Result Value Ref Range Status   Specimen Description BLOOD  RTAC  Final   Special Requests   Final    BOTTLES DRAWN AEROBIC ONLY Blood Culture results may not be optimal due to an inadequate volume of blood received in culture bottles   Culture   Final    NO GROWTH 5 DAYS Performed at [facility], [address] [place], [place] [address]    Report Status 05/09/2020 FINAL  Final  C Difficile Quick Screen w PCR reflex     Status: Abnormal   Collection Time: 05/04/20  3:24 AM   Specimen: STOOL  Result Value Ref Range Status   C Diff antigen POSITIVE (A) NEGATIVE Final   C Diff toxin NEGATIVE NEGATIVE Final   C Diff interpretation Results are indeterminate. See PCR results.  Final    Comment: Performed at [facility], [address] [place], [place] [address]  C. Diff by PCR, Reflexed     Status: Abnormal   Collection Time: 05/04/20  3:24 AM  Result Value Ref Range Status   Toxigenic C. Difficile by PCR POSITIVE (A) NEGATIVE Final    Comment: Performed at [facility], [address] [place], [place] [address]  Gastrointestinal Panel by PCR , Stool     Status: Abnormal   Collection Time: 05/04/20  3:33 AM   Specimen: STOOL  Result Value Ref Range Status   Campylobacter species NOT DETECTED NOT DETECTED Final   Plesimonas shigelloides NOT DETECTED NOT DETECTED Final   Salmonella species NOT DETECTED NOT DETECTED Final   Yersinia enterocolitica NOT DETECTED NOT DETECTED Final   Vibrio species NOT DETECTED NOT  DETECTED Final   Vibrio cholerae NOT DETECTED NOT DETECTED Final   Enteroaggregative E coli (EAEC) NOT DETECTED NOT DETECTED Final   Enteropathogenic E coli (EPEC) NOT DETECTED NOT DETECTED Final   Enterotoxigenic E coli (ETEC) NOT DETECTED NOT DETECTED Final   Shiga like toxin producing E coli (STEC) NOT DETECTED NOT DETECTED Final   Shigella/Enteroinvasive E coli (EIEC) NOT DETECTED NOT DETECTED Final   Cryptosporidium NOT DETECTED NOT DETECTED Final   Cyclospora cayetanensis NOT DETECTED NOT DETECTED Final   Entamoeba histolytica NOT DETECTED NOT DETECTED Final   Giardia lamblia NOT DETECTED NOT DETECTED Final   Adenovirus F40/41 DETECTED (A) NOT DETECTED Final   Astrovirus NOT DETECTED NOT DETECTED Final   Norovirus GI/GII NOT DETECTED NOT DETECTED Final   Rotavirus A NOT DETECTED NOT DETECTED Final   Sapovirus (I, II, IV, and V) NOT DETECTED NOT DETECTED Final    Comment: Performed at [facility], [address], [place], [place] [address]      Imaging Studies   No results found.   Medications   Scheduled Meds: . apixaban  5 mg Oral BID  . atorvastatin  40 mg Oral Daily  . carisoprodol  350 mg Oral TID  . dextromethorphan-guaiFENesin  1 tablet Oral BID  . diclofenac Sodium  4 g Topical QID  . DULoxetine  20 mg Oral Daily  . furosemide  60 mg Intravenous BID  . levothyroxine  75 mcg Oral Q0600  . metoprolol succinate  50  mg Oral Daily  . montelukast  10 mg Oral QHS  . sodium chloride flush  3 mL Intravenous Q12H  . traZODone  150 mg Oral QHS  . vancomycin  125 mg Oral QID   Continuous Infusions: . sodium chloride         LOS: 6 days    Time spent: 25 minutes with > 50% spent at bedside and in coordination of care.    Pennie Banter, DO [facility]  05/10/2020, 4:18 PM      If 7PM-7AM, please contact night-coverage. How to contact the [facility] Attending or Consulting provider 7A - 7P or covering provider during after hours 7P -7A, for  this patient?    1. Check the care team in [facility] and look for a) attending/consulting [facility] provider listed and b) the [facility] team listed 2. Log into [email or web address] and use [facility] universal password to access. If you do not have the password, please contact the hospital operator. 3. Locate the [facility] provider you are looking for under [facility] and page to a number that you can be directly reached. 4. If you still have difficulty reaching the provider, please page the [facility] (Director on Call) for the Hospitalists listed on amion for assistance.

## 2020-05-11 DIAGNOSIS — J9622 Acute and chronic respiratory failure with hypercapnia: Secondary | ICD-10-CM | POA: Diagnosis not present

## 2020-05-11 DIAGNOSIS — J9621 Acute and chronic respiratory failure with hypoxia: Secondary | ICD-10-CM | POA: Diagnosis not present

## 2020-05-11 DIAGNOSIS — A0472 Enterocolitis due to Clostridium difficile, not specified as recurrent: Secondary | ICD-10-CM | POA: Diagnosis not present

## 2020-05-11 DIAGNOSIS — Z789 Other specified health status: Secondary | ICD-10-CM | POA: Diagnosis not present

## 2020-05-11 DIAGNOSIS — I872 Venous insufficiency (chronic) (peripheral): Secondary | ICD-10-CM | POA: Diagnosis not present

## 2020-05-11 DIAGNOSIS — I5033 Acute on chronic diastolic (congestive) heart failure: Secondary | ICD-10-CM | POA: Diagnosis not present

## 2020-05-11 DIAGNOSIS — G4733 Obstructive sleep apnea (adult) (pediatric): Secondary | ICD-10-CM | POA: Diagnosis not present

## 2020-05-11 DIAGNOSIS — I482 Chronic atrial fibrillation, unspecified: Secondary | ICD-10-CM | POA: Diagnosis not present

## 2020-05-11 LAB — BASIC METABOLIC PANEL
Anion gap: 8 (ref 5–15)
BUN: 13 mg/dL (ref 6–20)
CO2: 31 mmol/L (ref 22–32)
Calcium: 8.7 mg/dL — ABNORMAL LOW (ref 8.9–10.3)
Chloride: 95 mmol/L — ABNORMAL LOW (ref 98–111)
Creatinine, Ser: 0.83 mg/dL (ref 0.61–1.24)
GFR, Estimated: >60 mL/min (ref >60)
Glucose, Bld: 123 mg/dL — ABNORMAL HIGH (ref 70–99)
Potassium: 4.4 mmol/L (ref 3.5–5.1)
Sodium: 134 mmol/L — ABNORMAL LOW (ref 135–145)

## 2020-05-11 NOTE — Progress Notes (Addendum)
PROGRESS NOTE    [name] [name]   KGU:[id number]  DOB: 1960-05-09  PCP: [name] Cords, DO    DOA: 05/04/2020 LOS: 7   Brief Narrative   [name] [name] a 60 y.o.malewith medical history significant forClass III obesity, BMI over 60, depression, HFrEF with last EF 30 to 35%, COPD, OSA chronic respiratory failure on home O2 at 3 L and on CPAP, A. fib and history of DVT onEliquis, CAD recently hospitalized from 2/22 - 3/14 with complications of Covid pneumonia requiring intubation, rehospitalized from 3/16-3/18, who returns to the emergency room with complaints of protracted weakness, inability to manage at home.    Patient, who lives alone was apparently discharged on 3/18 after he declined skilled rehab placement. He states that he would really like to go to rehab but is unwilling to give up his check because he has no other way to pay his bills.  Patient had called EMS multiple times since being DC from hospital on 3/18 needing assistance of moving to and from bed to chair and needing assistance with toilet and hygiene.  Patient is unable to care for self at home alone.  Assessment & Plan   Principal Problem:   Acute on chronic respiratory failure with hypoxia and hypercapnia (HCC) Active Problems:   Sepsis (HCC)   Acute on chronic diastolic CHF (congestive heart failure) (HCC)   Obstructive sleep apnea   Chronic atrial fibrillation (HCC)   HTN (hypertension)   Morbid obesity with BMI of 60.0-69.9, adult (HCC)   CAD (coronary artery disease)   Hypothyroidism   Chronic venous stasis dermatitis of both lower extremities   Chronic anticoagulation (warfarin  COUMADIN)   COPD (chronic obstructive pulmonary disease) (HCC)   Dyspnea and respiratory abnormality   Clostridium difficile diarrhea   Elevated lactic acid level   Unable to care for self   Chronic respiratory failure with hypoxia and hypercapnia on 2-3L home O2 COPD OSA on CPAP nightly Pt has chronic  dyspnea and BLE edema On presentation, sating 100% on home 3L O2. --cont home 2-3L supplemental O2 --cont CPAP while sleeping  Lower extremity edema /lymphedema -is followed by wound care clinic for Unna boots.   WOC consulted appreciate recommendations: "Wound care to bilateral LEs: Cleanse with soap and water, rinse and pat dry. Cover left lateral LE full thickness wound and right LE anterior partial thickness lesions with size appropriate pieces of silver hydrofiber (Aquacel Advantage, [name] [name] # [name] [name]). Top with ABD pads. Secure by wrapping from just below toes to just below knees with Kerlix roll gauze, top with 4-inch Coban (self-adherent wrap) applied in a similar manner. Attempt to elevate LEs while patient is sitting in the chair at intervals (example of goal would be for 1 hour every 4 hours)." --Diuresis as below --Unna boots  Acute on Chronic diastolic CHF - Pt with significantly increased edema.  Appears by med history that he may not been taking his torsemide.  Started on IV Lasix and having good response.  Continues to have good outputs and edema slowly beginning to improve. --continue Lasix 60 mg IV daily to BID --Monitor I/O's, weights --Low sodium diet, fluid restriction  Diarrhea, improved Possible Clostridium difficile  Adenovirus F40/41 gastroenteritis -Patient noted to have frequent watery stools in the ER, which started after presentation. -C. difficile antigen positive, and PCR positive.  No fever, no leukocytosis, but since having diarrhea, oral vanc started on presentation.  Diarrhea has since improved. --GI path returned positive for Adenovirus F40/41. Plan: --cont  oral vanc for a 10-day course, per ID rec  Sepsis ruled out --No fever, no leukocytosis, procal neg.    Lactic acidosis, resolved --3.8 on admission.  Unclear etiology. --resolved with NS@50  for 10 hours   Chronic anticoagulation Chronic atrial fibrillation (HCC) History of DVT --cont  home Toprol -Continue Eliquis  Morbid obesity with BMI of 60.0-69.9,with physical deconditioning Unable to care for self Body mass index is 32.39 kg/m. --TOC to work on placement to SNF --PT/OT while inpatient --requested air mattress to off-load pressure  Chronic venous stasis dermatitis both lower extremities --edema and erythema equal bilaterally, does not have infection.  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 --resumed on prior home regimen  --cont home Soma --Tylenol 1g TID scheduled for 5 days --Naproxen PRN for pain  Right shoulder pain, POA Probable rotator cuff strain --started a couple of week ago while pt was trying to pull himself up in bed.  Xray no acute finding.  MRI shoulder could not be performed due to him not fitting in the MRI machine. --Ortho was consulted and did joint injection 3/22 --Pain control as above --Voltaren gel topical    DVT prophylaxis:  apixaban (ELIQUIS) tablet 5 mg   Diet:  Diet Orders (From admission, onward)    Start     Ordered   05/05/20 1354  Diet Heart Room service appropriate? Yes; Fluid consistency: Thin  Diet effective now       Question Answer Comment  Room service appropriate? Yes   Fluid consistency: Thin      05/05/20 1353            Code Status: Full Code    Subjective 05/11/20    Pt seated in recliner when seen today.  Reports being very tired today from interrupted sleep overnight.  No other acute complaints.  Continues to have neck and back pain.  Unna boots have been applied.   Disposition Plan & Communication   Status is: Inpatient  Inpatient appropriate due to requiring IV diuresis as above due to volume overload.  Dispo: The  patient is from: Home              Anticipated d/c is to: SNF              Patient currently is NOT medically stable for d/c   Difficult to place patient Yes     Consults, Procedures, Significant Events   Consultants:     Procedures:   Shoulder injection  Antimicrobials:  Anti-infectives (From admission, onward)   Start     Dose/Rate Route Frequency Ordered Stop   05/04/20 1000  vancomycin (VANCOCIN) 50 mg/mL oral solution 125 mg        125 mg Oral 4 times daily 05/04/20 0514 05/14/20 0959        Micro    Objective   Vitals:   05/10/20 1803 05/10/20 2029 05/11/20 0449 05/11/20 0908  BP: 131/73 111/61 (!) 104/54 (!) 142/87  Pulse: 73 84 70 82  Resp: 20 20 20 16   Temp: 98.1 F (36.7 C) 98.3 F (36.8 C) 98.1 F (36.7 C) 98.1 F (36.7 C)  TempSrc: Oral Oral Oral Oral  SpO2: 98% 100% 100% 100%  Weight:   96.6 kg   Height:  Intake/Output Summary (Last 24 hours) at 05/11/2020 1744 Last data filed at 05/11/2020 0006 Gross per 24 hour  Intake -  Output 1375 ml  Net -1375 ml   Filed Weights   05/09/20 0500 05/10/20 0414 05/11/20 0449  Weight: (!) 187.5 kg (!) 186.3 kg 96.6 kg    Physical Exam:  General exam:up in recliner, awake, alert, no acute distress, morbidly obese Respiratory system: normal respiratory effort, symmetric chest rise, . Cardiovascular system: RRR, severe b/l LE edema overall stable.   Central nervous system: A&O x3. no gross focal neurologic deficits, normal speech Extremities: lower leg wraps removed, chronic venous stasis and severe edema, overgrown toe nails with onychomycosis   Labs   Data Reviewed: I have personally reviewed following labs and imaging studies  CBC: Recent Labs  Lab 05/05/20 0445 05/06/20 0625 05/07/20 0626 05/09/20 0634  WBC 3.8* 3.6* 4.0 4.1  HGB 9.7* 10.2* 10.2* 10.1*  HCT 30.7* 31.2* 30.9* 31.5*  MCV 100.7* 100.0 98.7 101.6*  PLT 165 180 226 218   Basic Metabolic Panel: Recent Labs  Lab  05/05/20 0336 05/06/20 0625 05/07/20 0626 05/09/20 0634 05/11/20 0517  NA 133* 137 135 134* 134*  K 4.1 4.1 4.9 4.7 4.4  CL 102 94* 95* 95* 95*  CO2 24 37* 33* 30 31  GLUCOSE 107* 137* 131* 114* 123*  BUN 16 10 10 12 13   CREATININE 1.01 0.66 0.62 0.69 0.83  CALCIUM 8.1* 9.0 8.8* 8.6* 8.7*  MG 1.9 1.6* 1.9 1.9  --    GFR: Estimated Creatinine Clearance: 108 mL/min (by C-G formula based on SCr of 0.83 mg/dL). Liver Function Tests: No results for input(s): AST, ALT, ALKPHOS, BILITOT, PROT, ALBUMIN in the last 168 hours. No results for input(s): LIPASE, AMYLASE in the last 168 hours. No results for input(s): AMMONIA in the last 168 hours. Coagulation Profile: No results for input(s): INR, PROTIME in the last 168 hours. Cardiac Enzymes: No results for input(s): CKTOTAL, CKMB, CKMBINDEX, TROPONINI in the last 168 hours. BNP (last 3 results) No results for input(s): PROBNP in the last 8760 hours. HbA1C: No results for input(s): HGBA1C in the last 72 hours. CBG: No results for input(s): GLUCAP in the last 168 hours. Lipid Profile: No results for input(s): CHOL, HDL, LDLCALC, TRIG, CHOLHDL, LDLDIRECT in the last 72 hours. Thyroid Function Tests: No results for input(s): TSH, T4TOTAL, FREET4, T3FREE, THYROIDAB in the last 72 hours. Anemia Panel: No results for input(s): VITAMINB12, FOLATE, FERRITIN, TIBC, IRON, RETICCTPCT in the last 72 hours. Sepsis Labs: Recent Labs  Lab 05/05/20 0336  LATICACIDVEN 0.6    Recent Results (from the past 240 hour(s))  Urine culture     Status: Abnormal   Collection Time: 05/04/20 12:56 AM   Specimen: In/Out Cath Urine  Result Value Ref Range Status   Specimen Description   Final    IN/OUT CATH URINE Performed at [facility], [address] [place], [place]    Special Requests   Final    NONE Performed at [facility], [address], [place], [place]    Culture (A)  Final    <10,000 COLONIES/mL  INSIGNIFICANT GROWTH Performed at [facility], [address], [place], [place]    Report Status 05/05/2020 FINAL  Final  Blood culture (routine single)     Status: None   Collection Time: 05/04/20  1:37 AM   Specimen: BLOOD  Result Value Ref Range Status   Specimen Description  BLOOD  RTAC  Final   Special Requests   Final    BOTTLES DRAWN AEROBIC ONLY Blood Culture results may not be optimal due to an inadequate volume of blood received in culture bottles   Culture   Final    NO GROWTH 5 DAYS Performed at [facility], [address] [place], [place] [address]    Report Status 05/09/2020 FINAL  Final  C Difficile Quick Screen w PCR reflex     Status: Abnormal   Collection Time: 05/04/20  3:24 AM   Specimen: STOOL  Result Value Ref Range Status   C Diff antigen POSITIVE (A) NEGATIVE Final   C Diff toxin NEGATIVE NEGATIVE Final   C Diff interpretation Results are indeterminate. See PCR results.  Final    Comment: Performed at [facility], [address] [place], [place] [address]  C. Diff by PCR, Reflexed     Status: Abnormal   Collection Time: 05/04/20  3:24 AM  Result Value Ref Range Status   Toxigenic C. Difficile by PCR POSITIVE (A) NEGATIVE Final    Comment: Performed at [facility], [address] [place], [place] [address]  Gastrointestinal Panel by PCR , Stool     Status: Abnormal   Collection Time: 05/04/20  3:33 AM   Specimen: STOOL  Result Value Ref Range Status   Campylobacter species NOT DETECTED NOT DETECTED Final   Plesimonas shigelloides NOT DETECTED NOT DETECTED Final   Salmonella species NOT DETECTED NOT DETECTED Final   Yersinia enterocolitica NOT DETECTED NOT DETECTED Final   Vibrio species NOT DETECTED NOT DETECTED Final   Vibrio cholerae NOT DETECTED NOT DETECTED Final   Enteroaggregative E coli (EAEC) NOT DETECTED NOT DETECTED Final   Enteropathogenic E coli (EPEC) NOT DETECTED NOT DETECTED Final    Enterotoxigenic E coli (ETEC) NOT DETECTED NOT DETECTED Final   Shiga like toxin producing E coli (STEC) NOT DETECTED NOT DETECTED Final   Shigella/Enteroinvasive E coli (EIEC) NOT DETECTED NOT DETECTED Final   Cryptosporidium NOT DETECTED NOT DETECTED Final   Cyclospora cayetanensis NOT DETECTED NOT DETECTED Final   Entamoeba histolytica NOT DETECTED NOT DETECTED Final   Giardia lamblia NOT DETECTED NOT DETECTED Final   Adenovirus F40/41 DETECTED (A) NOT DETECTED Final   Astrovirus NOT DETECTED NOT DETECTED Final   Norovirus GI/GII NOT DETECTED NOT DETECTED Final   Rotavirus A NOT DETECTED NOT DETECTED Final   Sapovirus (I, II, IV, and V) NOT DETECTED NOT DETECTED Final    Comment: Performed at [facility], [address] [place], [place] [address]      Imaging Studies   No results found.   Medications   Scheduled Meds: . apixaban  5 mg Oral BID  . atorvastatin  40 mg Oral Daily  . carisoprodol  350 mg Oral TID  . dextromethorphan-guaiFENesin  1 tablet Oral BID  . diclofenac Sodium  4 g Topical QID  . DULoxetine  20 mg Oral Daily  . furosemide  60 mg Intravenous BID  . levothyroxine  75 mcg Oral Q0600  . metoprolol succinate  50 mg Oral Daily  . montelukast  10 mg Oral QHS  . sodium chloride flush  3 mL Intravenous Q12H  . traZODone  150 mg Oral QHS  . vancomycin  125 mg Oral QID   Continuous Infusions: . sodium chloride         LOS: 7 days    Time spent: 20 minutes  Pennie Banter, DO [facility]  05/11/2020, 5:44 PM      If 7PM-7AM, please contact night-coverage. How to contact the [facility] Attending or Consulting provider 7A - 7P or covering provider during after hours 7P -7A, for this patient?    1. Check the care team in [facility] and look for a) attending/consulting TRH provider listed and b) the [facility] team listed 2. Log into [email or web address] and use [place]'s universal password to access. If you do not have the password, please contact  the hospital operator. 3. Locate the [facility] provider you are looking for under [facility] and page to a number that you can be directly reached. 4. If you still have difficulty reaching the provider, please page the [facility] (Director on Call) for the Hospitalists listed on amion for assistance.

## 2020-05-12 DIAGNOSIS — G4733 Obstructive sleep apnea (adult) (pediatric): Secondary | ICD-10-CM | POA: Diagnosis not present

## 2020-05-12 DIAGNOSIS — I482 Chronic atrial fibrillation, unspecified: Secondary | ICD-10-CM | POA: Diagnosis not present

## 2020-05-12 DIAGNOSIS — J9621 Acute and chronic respiratory failure with hypoxia: Secondary | ICD-10-CM | POA: Diagnosis not present

## 2020-05-12 DIAGNOSIS — Z789 Other specified health status: Secondary | ICD-10-CM | POA: Diagnosis not present

## 2020-05-12 DIAGNOSIS — J9622 Acute and chronic respiratory failure with hypercapnia: Secondary | ICD-10-CM | POA: Diagnosis not present

## 2020-05-12 DIAGNOSIS — A0472 Enterocolitis due to Clostridium difficile, not specified as recurrent: Secondary | ICD-10-CM | POA: Diagnosis not present

## 2020-05-12 DIAGNOSIS — I872 Venous insufficiency (chronic) (peripheral): Secondary | ICD-10-CM | POA: Diagnosis not present

## 2020-05-12 DIAGNOSIS — I5033 Acute on chronic diastolic (congestive) heart failure: Secondary | ICD-10-CM | POA: Diagnosis not present

## 2020-05-12 LAB — CBC
HCT: 33.2 % — ABNORMAL LOW (ref 39.0–52.0)
Hemoglobin: 10.7 g/dL — ABNORMAL LOW (ref 13.0–17.0)
MCH: 32.5 pg (ref 26.0–34.0)
MCHC: 32.2 g/dL (ref 30.0–36.0)
MCV: 100.9 fL — ABNORMAL HIGH (ref 80.0–100.0)
Platelets: 270 10*3/uL (ref 150–400)
RBC: 3.29 MIL/uL — ABNORMAL LOW (ref 4.22–5.81)
RDW: 15.9 % — ABNORMAL HIGH (ref 11.5–15.5)
WBC: 4.4 10*3/uL (ref 4.0–10.5)
nRBC: 0 % (ref 0.0–0.2)

## 2020-05-12 LAB — BASIC METABOLIC PANEL
Anion gap: 11 (ref 5–15)
BUN: 12 mg/dL (ref 6–20)
CO2: 30 mmol/L (ref 22–32)
Calcium: 8.7 mg/dL — ABNORMAL LOW (ref 8.9–10.3)
Chloride: 94 mmol/L — ABNORMAL LOW (ref 98–111)
Creatinine, Ser: 0.64 mg/dL (ref 0.61–1.24)
GFR, Estimated: >60 mL/min (ref >60)
Glucose, Bld: 119 mg/dL — ABNORMAL HIGH (ref 70–99)
Potassium: 4 mmol/L (ref 3.5–5.1)
Sodium: 135 mmol/L (ref 135–145)

## 2020-05-12 LAB — MAGNESIUM: Magnesium: 1.8 mg/dL (ref 1.7–2.4)

## 2020-05-12 NOTE — Progress Notes (Signed)
Occupational Therapy Treatment Patient Details Name: [name] [name] MRN: [id number] DOB: 1960/11/20 Today's Date: 05/12/2020    History of present illness 60 y.o. male with medical history significant for Class III obesity, BMI over 60, depression, HFrEF with last EF 30 to 35%, COPD, OSA chronic respiratory failure on home O2 at 3 L and on CPAP, A. fib and history of DVT on Eliquis, CAD recently hospitalized from 2/22 - 3/14, then 3/ with complications of Covid pneumonia requiring intubation, rehospitalized from 3/16-3/18, who returns to the emergency room with complaints of protracted weakness, inability to manage at home as well as shortness of breath and orthopnea and increased lower extremity edema.  He denies chest pain, cough fever or chills.  He also has diarrhea but denies abdominal pain, nausea or vomiting.  Patient, who lives alone was apparently discharged on 3/18 after he declined skilled rehab placement.  He states that he would really like to go to rehab but is unwilling to give up his check because he has no other way to pay his bills.   OT comments  Pt seen for OT tx this date. Pt seated in recliner finishing up lunch and agreeable to OT. Pt notifies OT of R shoulder difficulties and discomfort with the bed (glad to be in recliner) and requesting "cream" for his shoulder and upper back. RN notified via secure chat of pt's request. Pt educated in AE to improve independence with pericare and LB bathing; pt verbalized understanding and interest. Occasional verbal cues to redirect to education, as pt has tendency to begin sharing past experiences with [facility]. Pt continues to benefit from skilled OT services to maximize return to PLOF, minimize falls and functional decline, and minimize risk of increased caregiver burden and readmission. Continue to recommend SNF for short term rehab.    Follow Up Recommendations  SNF    Equipment Recommendations  None recommended by OT (defer to  next venue)    Recommendations for Other Services      Precautions / Restrictions Precautions Precautions: Fall Precaution Comments: Watch Rt shoulder Restrictions Weight Bearing Restrictions: No       Mobility Bed Mobility               General bed mobility comments: NT, pt in recliner    Transfers Overall transfer level: Needs assistance Equipment used: Rolling walker (2 wheeled) Transfers: Sit to/from Stand Sit to Stand: Supervision         General transfer comment: supervision to complete transfer with RW, pt reliant on UE support    Balance Overall balance assessment: Needs assistance Sitting-balance support: Feet supported Sitting balance-Leahy Scale: Good Sitting balance - Comments: able to sit unsupported when resting after catching his breath   Standing balance support: Bilateral upper extremity supported;During functional activity Standing balance-Leahy Scale: Fair Standing balance comment: Forward lean with mod support from the RW                           ADL either performed or assessed with clinical judgement   ADL Overall ADL's : Needs assistance/impaired Eating/Feeding: Independent                                     General ADL Comments: continues to require MAX A to TOTAL A for LB bathing and dressing. Difficulty holding urinal 2/2 body habitus     Vision  Patient Visual Report: No change from baseline     Perception     Praxis      Cognition Arousal/Alertness: Awake/alert Behavior During Therapy: WFL for tasks assessed/performed Overall Cognitive Status: Within Functional Limits for tasks assessed                                 General Comments: Pt is A and O x 4        Exercises Other Exercises Other Exercises: Pt set up with lunch at end of session Other Exercises: Pt educated in AE to improve independence with pericare and LB bathing; pt verbalized understanding and interest    Shoulder Instructions       General Comments      Pertinent Vitals/ Pain       Pain Assessment: No/denies pain Faces Pain Scale: Hurts a little bit Pain Location: R shoulder Pain Intervention(s): Limited activity within patient's tolerance;Monitored during session;Repositioned  Home Living                                          Prior Functioning/Environment              Frequency  Min 1X/week        Progress Toward Goals  OT Goals(current goals can now be found in the care plan section)  Progress towards OT goals: Progressing toward goals  Acute Rehab OT Goals Patient Stated Goal: To get better and stronger OT Goal Formulation: With patient Time For Goal Achievement: 05/20/20 Potential to Achieve Goals: Fair  Plan Discharge plan remains appropriate;Frequency remains appropriate    Co-evaluation                 AM-PAC OT "6 Clicks" Daily Activity     Outcome Measure   Help from another person eating meals?: None Help from another person taking care of personal grooming?: A Little Help from another person toileting, which includes using toliet, bedpan, or urinal?: Total Help from another person bathing (including washing, rinsing, drying)?: A Lot Help from another person to put on and taking off regular upper body clothing?: A Lot Help from another person to put on and taking off regular lower body clothing?: A Lot 6 Click Score: 14    End of Session Equipment Utilized During Treatment: Rolling walker  OT Visit Diagnosis: Unsteadiness on feet (R26.81);Repeated falls (R29.6);Muscle weakness (generalized) (M62.81)   Activity Tolerance Patient tolerated treatment well   Patient Left in chair;with call bell/phone within reach   Nurse Communication Other (comment) (pt requesting sponge bath later, cream for upper back and shoulder from RN)        Time: 6759-1638 OT Time Calculation (min): 20 min  Charges: OT General  Charges $OT Visit: 1 Visit OT Treatments $Self Care/Home Management : 8-22 mins  Wynona Canes, MPH, MS, OTR/L ascom [phone] 05/12/20, 2:42 PM

## 2020-05-12 NOTE — Progress Notes (Signed)
Physical Therapy Treatment Patient Details Name: [name] [name] MRN: [id number] DOB: [birth year] Today's Date: 05/12/2020    History of Present Illness 60 y.o. male with medical history significant for Class III obesity, BMI over 60, depression, HFrEF with last EF 30 to 35%, COPD, OSA chronic respiratory failure on home O2 at 3 L and on CPAP, A. fib and history of DVT on Eliquis, CAD recently hospitalized from 2/22 - 3/14, then 3/ with complications of Covid pneumonia requiring intubation, rehospitalized from 3/16-3/18, who returns to the emergency room with complaints of protracted weakness, inability to manage at home as well as shortness of breath and orthopnea and increased lower extremity edema.  He denies chest pain, cough fever or chills.  He also has diarrhea but denies abdominal pain, nausea or vomiting.  Patient, who lives alone was apparently discharged on 3/18 after he declined skilled rehab placement.  He states that he would really like to go to rehab but is unwilling to give up his check because he has no other way to pay his bills.    PT Comments    Patient alert, in chair agreeable to PT. Session focused on progressing ambulation and activity tolerance. Pt still exhibited deficits in endurance as well as activity pacing, educated on PLB throughout mobility as well as pacing to maximize function and safety. Pt able to ambulate 59ft twice with RW and CGA. Several minutes needed after ea bout due to DOE, spO2 >95% throughout on 3L via [place]. Pt assisted with lunch set up at end of session. The patient would benefit from further skilled PT intervention to continue to progress towards goals. Recommendation remains appropriate.     Follow Up Recommendations  SNF     Equipment Recommendations  None recommended by PT    Recommendations for Other Services       Precautions / Restrictions Precautions Precautions: Fall Precaution Comments: Watch Rt shoulder Restrictions Weight Bearing  Restrictions: No    Mobility  Bed Mobility               General bed mobility comments: NT, pt in recliner    Transfers Overall transfer level: Needs assistance Equipment used: Rolling walker (2 wheeled) Transfers: Sit to/from Stand Sit to Stand: Supervision         General transfer comment: supervision to complete transfer with RW, pt reliant on UE support  Ambulation/Gait Ambulation/Gait assistance: Min guard Gait Distance (Feet): 10 Feet (x2) Assistive device: Rolling walker (2 wheeled)       General Gait Details: short step length, steady, with RW. Pt fatigued, SOB and needed seated rest break after ea bout of 40ft. spO2 >90% throughout on 3 L via [place]   Stairs             Wheelchair Mobility    Modified [facility] (Stroke Patients Only)       Balance Overall balance assessment: Needs assistance Sitting-balance support: Feet supported Sitting balance-Leahy Scale: Good Sitting balance - Comments: able to sit unsupported when resting after catching his breath   Standing balance support: Bilateral upper extremity supported;During functional activity Standing balance-Leahy Scale: Fair Standing balance comment: Forward lean with mod support from the RW                            Cognition Arousal/Alertness: Awake/alert Behavior During Therapy: [facility] for tasks assessed/performed Overall Cognitive Status: Within Functional Limits for tasks assessed  General Comments: Pt is A and O x 4      Exercises Other Exercises Other Exercises: Pt set up with lunch at end of session    General Comments        Pertinent Vitals/Pain Pain Assessment: Faces Faces Pain Scale: Hurts a little bit Pain Location: R shoulder Pain Intervention(s): Limited activity within patient's tolerance;Monitored during session;Repositioned    Home Living                      Prior Function            PT Goals  (current goals can now be found in the care plan section) Progress towards PT goals: Progressing toward goals    Frequency    Min 2X/week      PT Plan Current plan remains appropriate    Co-evaluation              AM-PAC PT "6 Clicks" Mobility   Outcome Measure  Help needed turning from your back to your side while in a flat bed without using bedrails?: A Little Help needed moving from lying on your back to sitting on the side of a flat bed without using bedrails?: A Little Help needed moving to and from a bed to a chair (including a wheelchair)?: A Little Help needed standing up from a chair using your arms (e.g., wheelchair or bedside chair)?: A Little Help needed to walk in hospital room?: A Lot Help needed climbing 3-5 steps with a railing? : Total 6 Click Score: 15    End of Session Equipment Utilized During Treatment: Oxygen Activity Tolerance: Patient limited by fatigue Patient left: in chair;with call bell/phone within reach Nurse Communication: Mobility status PT Visit Diagnosis: Other abnormalities of gait and mobility (R26.89);Muscle weakness (generalized) (M62.81);Difficulty in walking, not elsewhere classified (R26.2);Unsteadiness on feet (R26.81)     Time: 1610-9604 PT Time Calculation (min) (ACUTE ONLY): 16 min  Charges:  $Therapeutic Exercise: 8-22 mins                     Olga Coaster PT, DPT 1:45 PM,05/12/20

## 2020-05-12 NOTE — Progress Notes (Signed)
PROGRESS NOTE    Catherine Oak   IZT:[id number]  DOB: Dec 14, 1960  PCP: [name], DO    DOA: 05/04/2020 LOS: 8   Brief Narrative   [name] Fallen Hubbardis a 60 y.o.malewith medical history significant forClass III obesity, BMI over 60, depression, HFrEF with last EF 30 to 35%, COPD, OSA chronic respiratory failure on home O2 at 3 L and on CPAP, A. fib and history of DVT onEliquis, CAD recently hospitalized from 2/22 - 3/14 with complications of Covid pneumonia requiring intubation, rehospitalized from 3/16-3/18, who returns to the emergency room with complaints of protracted weakness, inability to manage at home.    Patient, who lives alone was apparently discharged on 3/18 after he declined skilled rehab placement. He states that he would really like to go to rehab but is unwilling to give up his check because he has no other way to pay his bills.  Patient had called EMS multiple times since being DC from hospital on 3/18 needing assistance of moving to and from bed to chair and needing assistance with toilet and hygiene.  Patient is unable to care for self at home alone.  Assessment & Plan   Principal Problem:   Acute on chronic respiratory failure with hypoxia and hypercapnia (HCC) Active Problems:   Sepsis (HCC)   Acute on chronic diastolic CHF (congestive heart failure) (HCC)   Obstructive sleep apnea   Chronic atrial fibrillation (HCC)   HTN (hypertension)   Morbid obesity with BMI of 60.0-69.9, adult (HCC)   CAD (coronary artery disease)   Hypothyroidism   Chronic venous stasis dermatitis of both lower extremities   Chronic anticoagulation (warfarin  COUMADIN)   COPD (chronic obstructive pulmonary disease) (HCC)   Dyspnea and respiratory abnormality   Clostridium difficile diarrhea   Elevated lactic acid level   Unable to care for self   Chronic respiratory failure with hypoxia and hypercapnia on 2-3L home O2 COPD OSA on CPAP nightly Pt has chronic  dyspnea and BLE edema On presentation, satting 100% on home 3L O2. --cont home 2-3L supplemental O2 --cont CPAP while sleeping  Lower extremity edema /lymphedema -is followed by wound care clinic for Unna boots.   WOC consulted appreciate recommendations:  "Wound care to bilateral LEs: Cleanse with soap and water, rinse and pat dry. Cover left lateral LE full thickness wound and right LE anterior partial thickness lesions with size appropriate pieces of silver hydrofiber (Aquacel Advantage, [place] # [id number]). Top with ABD pads. Secure by wrapping from just below toes to just below knees with Kerlix roll gauze, top with 4-inch Coban (self-adherent wrap) applied in a similar manner. Attempt to elevate LEs while patient is sitting in the chair at intervals (example of goal would be for 1 hour every 4 hours)." --Diuresis as below --Unna boots  Acute on Chronic diastolic CHF - Pt with significantly increased edema.  Appears by med history that he may not been taking his torsemide.  Started on IV Lasix and having good response.  Continues to have good outputs and edema slowly beginning to improve. 3/28: 2L output yesterday, renal fx stable --continue Lasix 60 mg IV daily to BID --Monitor I/O's, weights --Low sodium diet, fluid restriction  Diarrhea, improved Possible Clostridium difficile  Adenovirus F40/41 gastroenteritis -Patient noted to have frequent watery stools in the ER, which started after presentation. -C. difficile antigen positive, and PCR positive.  No fever, no leukocytosis, but since having diarrhea, oral vanc started on presentation.  Diarrhea has since improved. --GI  path returned positive for Adenovirus F40/41. Plan: --cont oral vanc for a 10-day course, per ID rec  Sepsis ruled out --No fever, no leukocytosis, procal neg.    Lactic acidosis, resolved --3.8 on admission.  Unclear etiology. --resolved with NS@50  for 10 hours   Chronic anticoagulation Chronic  atrial fibrillation (HCC) History of DVT --cont home Toprol -Continue Eliquis  Morbid obesity with BMI of 60.0-69.9,with physical deconditioning Unable to care for self Body mass index is 62.72 kg/m. --TOC to work on placement to SNF --PT/OT while inpatient --requested air mattress to off-load pressure  Chronic venous stasis dermatitis both lower extremities --edema and erythema equal bilaterally, does not have infection.  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 --resumed on prior home regimen  --cont home Soma --Tylenol 1g TID scheduled for 5 days --Naproxen PRN for pain  Right shoulder pain, POA Probable rotator cuff strain --started a couple of week ago while pt was trying to pull himself up in bed.  Xray no acute finding.  MRI shoulder could not be performed due to him not fitting in the MRI machine. --Ortho was consulted and did joint injection 3/22 --Pain control as above --Voltaren gel topical    DVT prophylaxis:  apixaban (ELIQUIS) tablet 5 mg   Diet:  Diet Orders (From admission, onward)    Start     Ordered   05/12/20 1341  Diet Heart Room service appropriate? Yes; Fluid consistency: Thin; Fluid restriction: 1800 mL Fluid  Diet effective now       Question Answer Comment  Room service appropriate? Yes   Fluid consistency: Thin   Fluid restriction: 1800 mL Fluid      05/12/20 1340            Code Status: Full Code    Subjective 05/12/20    Pt seated in recliner when seen today.  Reports again no sleep due to staff interruptions.  Continues to have high urine output.  Says still feels "puffy" or swollen more than baselines.  No other acute issues.  Able to work with PT earlier and  says did better but still far more weak than baseline.     Disposition Plan & Communication   Status is: Inpatient  Inpatient appropriate due to unsafe d/c due to ongoing IV diuresis for volume overload / CHF.  Dispo: The patient is from: Home              Anticipated d/c is to: SNF              Patient currently is NOT medically stable for d/c   Difficult to place patient Yes     Consults, Procedures, Significant Events   Consultants:     Procedures:   Shoulder injection  Antimicrobials:  Anti-infectives (From admission, onward)   Start     Dose/Rate Route Frequency Ordered Stop   05/04/20 1000  vancomycin (VANCOCIN) 50 mg/mL oral solution 125 mg        125 mg Oral 4 times daily 05/04/20 0514 05/14/20 0959        Micro    Objective   Vitals:   05/11/20 0908 05/11/20 2005 05/12/20 0432 05/12/20 0958  BP: (!) 142/87 137/89 114/74 129/76  Pulse: 82 84 71 77  Resp: 16 20 16  Temp: 98.1 F (36.7 C) 98.1 F (36.7 C) 98.9 F (37.2 C)   TempSrc: Oral     SpO2: 100% 100% 99%   Weight:   (!) 187.1 kg   Height:        Intake/Output Summary (Last 24 hours) at 05/12/2020 1738 Last data filed at 05/12/2020 0432 Gross per 24 hour  Intake -  Output 600 ml  Net -600 ml   Filed Weights   05/10/20 0414 05/11/20 0449 05/12/20 0432  Weight: (!) 186.3 kg 96.6 kg (!) 187.1 kg    Physical Exam:  General exam:up in recliner, awake, alert, no acute distress, morbidly obese Respiratory system: diminished due to body habitus, normal respiratory effort, symmetric chest rise. Cardiovascular system: RRR, severe b/l LE edema extending proximally to thighs Central nervous system: A&O x3. no gross focal neurologic deficits, normal speech Extremities: Unna boots in place, severe edema proximally, moves all   Labs   Data Reviewed: I have personally reviewed following labs and imaging studies  CBC: Recent Labs  Lab 05/06/20 0625 05/07/20 0626 05/09/20 0634  05/12/20 0829  WBC 3.6* 4.0 4.1 4.4  HGB 10.2* 10.2* 10.1* 10.7*  HCT 31.2* 30.9* 31.5* 33.2*  MCV 100.0 98.7 101.6* 100.9*  PLT 180 226 218 270   Basic Metabolic Panel: Recent Labs  Lab 05/06/20 0625 05/07/20 0626 05/09/20 0634 05/11/20 0517 05/12/20 0829  NA 137 135 134* 134* 135  K 4.1 4.9 4.7 4.4 4.0  CL 94* 95* 95* 95* 94*  CO2 37* 33* 30 31 30   GLUCOSE 137* 131* 114* 123* 119*  BUN 10 10 12 13 12   CREATININE 0.66 0.62 0.69 0.83 0.64  CALCIUM 9.0 8.8* 8.6* 8.7* 8.7*  MG 1.6* 1.9 1.9  --  1.8   GFR: Estimated Creatinine Clearance: 163 mL/min (by C-G formula based on SCr of 0.64 mg/dL). Liver Function Tests: No results for input(s): AST, ALT, ALKPHOS, BILITOT, PROT, ALBUMIN in the last 168 hours. No results for input(s): LIPASE, AMYLASE in the last 168 hours. No results for input(s): AMMONIA in the last 168 hours. Coagulation Profile: No results for input(s): INR, PROTIME in the last 168 hours. Cardiac Enzymes: No results for input(s): CKTOTAL, CKMB, CKMBINDEX, TROPONINI in the last 168 hours. BNP (last 3 results) No results for input(s): PROBNP in the last 8760 hours. HbA1C: No results for input(s): HGBA1C in the last 72 hours. CBG: No results for input(s): GLUCAP in the last 168 hours. Lipid Profile: No results for input(s): CHOL, HDL, LDLCALC, TRIG, CHOLHDL, LDLDIRECT in the last 72 hours. Thyroid Function Tests: No results for input(s): TSH, T4TOTAL, FREET4, T3FREE, THYROIDAB in the last 72 hours. Anemia Panel: No results for input(s): VITAMINB12, FOLATE, FERRITIN, TIBC, IRON, RETICCTPCT in the last 72 hours. Sepsis Labs: No results for input(s): PROCALCITON, LATICACIDVEN in the last 168 hours.  Recent Results (from the past 240 hour(s))  Urine culture     Status: Abnormal   Collection Time: 05/04/20 12:56 AM   Specimen: In/Out Cath Urine  Result Value Ref Range Status   Specimen Description   Final    IN/OUT CATH URINE Performed at [facility], [address] [place], [address] [place]    Special Requests   Final    NONE Performed at [facility], [address] [place], [address] [place]    Culture (A)  Final    <10,000 COLONIES/mL INSIGNIFICANT GROWTH Performed at [facility], [address], [place], [address] [place]  Report Status 05/05/2020 FINAL  Final  Blood culture (routine single)     Status: None   Collection Time: 05/04/20  1:37 AM   Specimen: BLOOD  Result Value Ref Range Status   Specimen Description BLOOD  RTAC  Final   Special Requests   Final    BOTTLES DRAWN AEROBIC ONLY Blood Culture results may not be optimal due to an inadequate volume of blood received in culture bottles   Culture   Final    NO GROWTH 5 DAYS Performed at [facility], [address] [place], [place] [address]    Report Status 05/09/2020 FINAL  Final  C Difficile Quick Screen w PCR reflex     Status: Abnormal   Collection Time: 05/04/20  3:24 AM   Specimen: STOOL  Result Value Ref Range Status   C Diff antigen POSITIVE (A) NEGATIVE Final   C Diff toxin NEGATIVE NEGATIVE Final   C Diff interpretation Results are indeterminate. See PCR results.  Final    Comment: Performed at [facility], [address] [place], [place] [address]  C. Diff by PCR, Reflexed     Status: Abnormal   Collection Time: 05/04/20  3:24 AM  Result Value Ref Range Status   Toxigenic C. Difficile by PCR POSITIVE (A) NEGATIVE Final    Comment: Performed at [facility], [address] [place], [place] [address]  Gastrointestinal Panel by PCR , Stool     Status: Abnormal   Collection Time: 05/04/20  3:33 AM   Specimen: STOOL  Result Value Ref Range Status   Campylobacter species NOT DETECTED NOT DETECTED Final   Plesimonas shigelloides NOT DETECTED NOT DETECTED Final   Salmonella species NOT DETECTED NOT DETECTED Final   Yersinia enterocolitica NOT DETECTED NOT DETECTED Final   Vibrio species NOT  DETECTED NOT DETECTED Final   Vibrio cholerae NOT DETECTED NOT DETECTED Final   Enteroaggregative E coli (EAEC) NOT DETECTED NOT DETECTED Final   Enteropathogenic E coli (EPEC) NOT DETECTED NOT DETECTED Final   Enterotoxigenic E coli (ETEC) NOT DETECTED NOT DETECTED Final   Shiga like toxin producing E coli (STEC) NOT DETECTED NOT DETECTED Final   Shigella/Enteroinvasive E coli (EIEC) NOT DETECTED NOT DETECTED Final   Cryptosporidium NOT DETECTED NOT DETECTED Final   Cyclospora cayetanensis NOT DETECTED NOT DETECTED Final   Entamoeba histolytica NOT DETECTED NOT DETECTED Final   Giardia lamblia NOT DETECTED NOT DETECTED Final   Adenovirus F40/41 DETECTED (A) NOT DETECTED Final   Astrovirus NOT DETECTED NOT DETECTED Final   Norovirus GI/GII NOT DETECTED NOT DETECTED Final   Rotavirus A NOT DETECTED NOT DETECTED Final   Sapovirus (I, II, IV, and V) NOT DETECTED NOT DETECTED Final    Comment: Performed at [facility], [address], [place], [place] [address]      Imaging Studies   No results found.   Medications   Scheduled Meds: . apixaban  5 mg Oral BID  . atorvastatin  40 mg Oral Daily  . carisoprodol  350 mg Oral TID  . dextromethorphan-guaiFENesin  1 tablet Oral BID  . diclofenac Sodium  4 g Topical QID  . DULoxetine  20 mg Oral Daily  . furosemide  60 mg Intravenous BID  . levothyroxine  75 mcg Oral Q0600  . metoprolol succinate  50 mg Oral Daily  . montelukast  10 mg Oral QHS  . sodium chloride flush  3 mL Intravenous Q12H  . traZODone  150 mg  Oral QHS  . vancomycin  125 mg Oral QID   Continuous Infusions: . sodium chloride         LOS: 8 days    Time spent: 20 minutes     Pennie BanterKelly A Emoni Whitworth, DO [facility]  05/12/2020, 5:38 PM      If 7PM-7AM, please contact night-coverage. How to contact the [facility] Attending or Consulting provider 7A - 7P or covering provider during after hours 7P -7A, for this patient?    1. Check the care team in  [facility] and look for a) attending/consulting TR[facility] provider listed and b) the [facility] team listed 2. Log into [email or web address] and use [place]'s universal password to access. If you do not have the password, please contact the hospital operator. 3. Locate the [facility] provider you are looking for under [facility] and page to a number that you can be directly reached. 4. If you still have difficulty reaching the provider, please page the [facility] (Director on Call) for the Hospitalists listed on amion for assistance.

## 2020-05-12 NOTE — TOC Progression Note (Signed)
Transition of Care [facility]) - Progression Note    Patient Details  Name: [name] [name] MRN: [id number] Date of Birth: 1960-05-03  Transition of Care [facility]) CM/SW Contact  Chapman Fitch, RN Phone Number: 05/12/2020, 2:43 PM  Clinical Narrative:      Still no bed offers patient updated      Expected Discharge Plan and Services                                                 Social Determinants of Health (SDOH) Interventions    Readmission Risk Interventions Readmission Risk Prevention Plan 01/11/2020 12/24/2019 01/25/2019  Transportation Screening Complete Complete Complete  PCP or Specialist Appt within 3-5 Days - - -  HRI or Home Care Consult - - -  Social Work Consult for Recovery Care Planning/Counseling - - -  Palliative Care Screening - - -  Medication Review (RN Care Manager) Complete Complete Complete  PCP or Specialist appointment within 3-5 days of discharge Complete Complete Complete  HRI or Home Care Consult - - Complete  SW Recovery Care/Counseling Consult Complete Complete Complete  Palliative Care Screening Complete Not Applicable Not Applicable  Skilled Nursing Facility Not Applicable Not Applicable Not Applicable

## 2020-05-12 NOTE — Progress Notes (Signed)
PT Cancellation Note  Patient Details Name: [name] [name] MRN: [id number] DOB: Aug 20, 1960   Cancelled Treatment:    Reason Eval/Treat Not Completed: Other (comment). Pt checked in on patient twice this AM. First attempt, PT brought breakfast in, requested to eat prior to therapy. Second attempt pt had just called out to use BSC, able to stand pivot with supervision and bilateral UE support and requested extended time to sit. Call bell handed to patient. PT to re-attempt as able.  Olga Coaster PT, DPT 11:00 AM,05/12/20

## 2020-05-13 ENCOUNTER — Encounter: Payer: Medicaid Other | Admitting: Occupational Therapy

## 2020-05-13 DIAGNOSIS — J9622 Acute and chronic respiratory failure with hypercapnia: Secondary | ICD-10-CM | POA: Diagnosis not present

## 2020-05-13 DIAGNOSIS — I872 Venous insufficiency (chronic) (peripheral): Secondary | ICD-10-CM | POA: Diagnosis not present

## 2020-05-13 DIAGNOSIS — J9621 Acute and chronic respiratory failure with hypoxia: Secondary | ICD-10-CM | POA: Diagnosis not present

## 2020-05-13 DIAGNOSIS — G4733 Obstructive sleep apnea (adult) (pediatric): Secondary | ICD-10-CM | POA: Diagnosis not present

## 2020-05-13 DIAGNOSIS — I5033 Acute on chronic diastolic (congestive) heart failure: Secondary | ICD-10-CM | POA: Diagnosis not present

## 2020-05-13 DIAGNOSIS — A0472 Enterocolitis due to Clostridium difficile, not specified as recurrent: Secondary | ICD-10-CM | POA: Diagnosis not present

## 2020-05-13 DIAGNOSIS — Z789 Other specified health status: Secondary | ICD-10-CM | POA: Diagnosis not present

## 2020-05-13 DIAGNOSIS — I482 Chronic atrial fibrillation, unspecified: Secondary | ICD-10-CM | POA: Diagnosis not present

## 2020-05-13 MED ORDER — TORSEMIDE 20 MG PO TABS
80.0000 mg | ORAL_TABLET | Freq: Two times a day (BID) | ORAL | Status: DC
  Administered 2020-05-13 – 2020-05-16 (×6): 80 mg via ORAL
  Filled 2020-05-13 (×9): qty 4
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
## 2020-05-13 NOTE — Progress Notes (Signed)
Physical Therapy Treatment Patient Details Name: [name] [name] MRN: [id number] DOB: 1961/02/06 Today's Date: 05/13/2020    History of Present Illness 60 y.o. male with medical history significant for Class III obesity, BMI over 60, depression, HFrEF with last EF 30 to 35%, COPD, OSA chronic respiratory failure on home O2 at 3 L and on CPAP, A. fib and history of DVT on Eliquis, CAD recently hospitalized from 2/22 - 3/14, then 3/ with complications of Covid pneumonia requiring intubation, rehospitalized from 3/16-3/18, who returns to the emergency room with complaints of protracted weakness, inability to manage at home as well as shortness of breath and orthopnea and increased lower extremity edema.  He denies chest pain, cough fever or chills.  He also has diarrhea but denies abdominal pain, nausea or vomiting.  Patient, who lives alone was apparently discharged on 3/18 after he declined skilled rehab placement.  He states that he would really like to go to rehab but is unwilling to give up his check because he has no other way to pay his bills.    PT Comments    Patient received in recliner. Agrees to PT. He is able to ambulate 20 feet at a time before needing seated rest. Requires supervision to min guard for all mobility today. Patient limited by fatigue and respiratory status. Patient will continue to benefit from skilled PT while here to improve strength, endurance and safety.      Follow Up Recommendations  SNF     Equipment Recommendations  None recommended by PT    Recommendations for Other Services       Precautions / Restrictions Precautions Precautions: Fall Restrictions Weight Bearing Restrictions: No    Mobility  Bed Mobility               General bed mobility comments: NT, pt in recliner    Transfers     Transfers: Sit to/from Stand Sit to Stand: Supervision            Ambulation/Gait Ambulation/Gait assistance: Supervision Gait Distance (Feet):  40 Feet 20'x2 reps with seated rest between.  Assistive device: Rolling walker (2 wheeled) Gait Pattern/deviations: Step-through pattern Gait velocity: WNL, actually needs cues to slow down so that he maintains safety   General Gait Details: short step length, steady, with RW. Pt fatigued, SOB and needed seated rest break after ea bout of 67ft. spO2 >90% throughout on 3 L via [place]   Stairs             Wheelchair Mobility    Modified Rankin (Stroke Patients Only)       Balance Overall balance assessment: Modified Independent Sitting-balance support: Feet supported Sitting balance-Leahy Scale: Good     Standing balance support: Bilateral upper extremity supported;During functional activity Standing balance-Leahy Scale: Fair Standing balance comment: Forward lean with mod support from the RW                            Cognition Arousal/Alertness: Awake/alert Behavior During Therapy: WFL for tasks assessed/performed Overall Cognitive Status: Within Functional Limits for tasks assessed                                 General Comments: Pt is A and O x 4      Exercises Other Exercises Other Exercises: LAQ x 10 reps bilaterally, STS x 5    General Comments  Pertinent Vitals/Pain Pain Assessment: No/denies pain Faces Pain Scale: Hurts a little bit Pain Location: R shoulder Pain Descriptors / Indicators: Headache Pain Intervention(s): Monitored during session    Home Living                      Prior Function            PT Goals (current goals can now be found in the care plan section) Acute Rehab PT Goals Patient Stated Goal: To get better and stronger PT Goal Formulation: With patient Time For Goal Achievement: 05/22/20 Potential to Achieve Goals: Good Progress towards PT goals: Progressing toward goals    Frequency    Min 2X/week      PT Plan Current plan remains appropriate    Co-evaluation               AM-PAC PT "6 Clicks" Mobility   Outcome Measure  Help needed turning from your back to your side while in a flat bed without using bedrails?: A Little Help needed moving from lying on your back to sitting on the side of a flat bed without using bedrails?: A Little Help needed moving to and from a bed to a chair (including a wheelchair)?: A Little Help needed standing up from a chair using your arms (e.g., wheelchair or bedside chair)?: A Little Help needed to walk in hospital room?: A Lot Help needed climbing 3-5 steps with a railing? : Total 6 Click Score: 15    End of Session Equipment Utilized During Treatment: Oxygen Activity Tolerance: Patient limited by fatigue Patient left: in chair;with call bell/phone within reach Nurse Communication: Mobility status PT Visit Diagnosis: Other abnormalities of gait and mobility (R26.89);Muscle weakness (generalized) (M62.81);Difficulty in walking, not elsewhere classified (R26.2);Unsteadiness on feet (R26.81)     Time: 1610-9604 PT Time Calculation (min) (ACUTE ONLY): 19 min  Charges:  $Therapeutic Exercise: 8-22 mins                     [name] [name], PT, GCS 05/13/20,4:20 PM

## 2020-05-14 DIAGNOSIS — I2510 Atherosclerotic heart disease of native coronary artery without angina pectoris: Secondary | ICD-10-CM | POA: Diagnosis not present

## 2020-05-14 DIAGNOSIS — I5033 Acute on chronic diastolic (congestive) heart failure: Secondary | ICD-10-CM | POA: Diagnosis not present

## 2020-05-14 DIAGNOSIS — J9621 Acute and chronic respiratory failure with hypoxia: Secondary | ICD-10-CM | POA: Diagnosis not present

## 2020-05-14 DIAGNOSIS — J9622 Acute and chronic respiratory failure with hypercapnia: Secondary | ICD-10-CM | POA: Diagnosis not present

## 2020-05-14 DIAGNOSIS — E039 Hypothyroidism, unspecified: Secondary | ICD-10-CM | POA: Diagnosis not present

## 2020-05-14 DIAGNOSIS — Z7901 Long term (current) use of anticoagulants: Secondary | ICD-10-CM | POA: Diagnosis not present

## 2020-05-14 DIAGNOSIS — J449 Chronic obstructive pulmonary disease, unspecified: Secondary | ICD-10-CM | POA: Diagnosis not present

## 2020-05-14 DIAGNOSIS — I10 Essential (primary) hypertension: Secondary | ICD-10-CM | POA: Diagnosis not present

## 2020-05-14 DIAGNOSIS — I482 Chronic atrial fibrillation, unspecified: Secondary | ICD-10-CM | POA: Diagnosis not present

## 2020-05-14 DIAGNOSIS — I872 Venous insufficiency (chronic) (peripheral): Secondary | ICD-10-CM | POA: Diagnosis not present

## 2020-05-14 LAB — BASIC METABOLIC PANEL
Anion gap: 8 (ref 5–15)
BUN: 13 mg/dL (ref 6–20)
CO2: 32 mmol/L (ref 22–32)
Calcium: 8.9 mg/dL (ref 8.9–10.3)
Chloride: 97 mmol/L — ABNORMAL LOW (ref 98–111)
Creatinine, Ser: 0.79 mg/dL (ref 0.61–1.24)
GFR, Estimated: >60 mL/min (ref >60)
Glucose, Bld: 125 mg/dL — ABNORMAL HIGH (ref 70–99)
Potassium: 4.5 mmol/L (ref 3.5–5.1)
Sodium: 137 mmol/L (ref 135–145)

## 2020-05-14 NOTE — Progress Notes (Signed)
PROGRESS NOTE  [name] [name] LZJ:[id number] DOB: [birth year] DOA: 05/04/2020 PCP: [name], DO   LOS: 10 days   Brief Narrative / Interim history: [name] Hubbardis a 60 y.o.malewith medical history significant forClass III obesity, BMI over 60, depression, HFrEF with last EF 30 to 35%, COPD, OSA chronic respiratory failure on home O2 at 3 L and on CPAP, A. fib and history of DVT onEliquis, CAD recently hospitalized from 2/22 - 3/14 with complications of Covid pneumonia requiring intubation, rehospitalized from 3/16-3/18, who returns to the emergency room with complaints of protracted weakness, inability to manage at home. Patient, who lives alone was apparently discharged on 3/18 after he declined skilled rehab placement. He states that he would really like to go to rehab but is unwilling to give up his check because he has no other way to pay his bills. Patient had called EMS multiple times since being DC from hospital on 3/18 needing assistance of moving to and from bed to chair and needing assistance with toilet and hygiene. Patient is unable to care for self at home alone.  Subjective / 24h Interval events: Is doing okay this morning, denies any chest pain.  States that he is feeling overall better.  Feels like he is getting stronger.  Assessment & Plan: Principal Problem Chronic hypoxic and hypercarbic respiratory failure, COPD, OSA on CPAP -patient appears to be at baseline in terms of his respiratory status.  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  He is on 2-3 L at home, satting well on that.  Active Problems Chronic lower extremity edema/lymphedema, chronic venous stasis bilateral-he is followed by wound care clinic for Unna boots.  Wound care consulted and evaluated patient, recommending "Wound care to bilateral LEs: Cleanse with soap and water, rinse  and pat dry. Cover left lateral LE full thickness wound and right LE anterior partial thickness lesions with size appropriate pieces of silver hydrofiber (Aquacel Advantage, Lawson # [id number]). Top with ABD pads. Secure by wrapping from just below toes to just below knees with Kerlix roll gauze, top with 4-inch Coban (self-adherent wrap) applied in a similar manner. Attempt to elevate LEs while patient is sitting in the chair at intervals (example of goal would be for 1 hour every 4 hours)."  Acute on chronic diastolic CHF-patient was admitted to the hospital with increased lower extremity edema.  He received initially intravenous Lasix, return close to baseline, has been placed now on his home torsemide.  Diarrhea due to adenovirus, C. difficile-in the ER he was started to have frequent watery stools.  C. difficile antigen positive was positive as well.  Started on oral vancomycin, prior hospitalist discussed with ID.  GI pathogen panel was also positive for adenovirus.  He completed a 10-day course of p.o. vancomycin per ID recommendations.  Sepsis ruled out  Chronic A. 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  Could not fit in the MRI machine, and x-ray showed no acute findings.  Orthopedic surgery consulted and he is status post joint injection on 3/22.  Disposition-awaiting SNF however patient feels a little bit stronger  and is considering to go home  Obesity-based on a BMI of 62.  Patient will benefit from weight loss  Scheduled Meds: . apixaban  5 mg Oral BID  . atorvastatin  40 mg Oral Daily  . carisoprodol  350 mg Oral TID  .  dextromethorphan-guaiFENesin  1 tablet Oral BID  . diclofenac Sodium  4 g Topical QID  . DULoxetine  20 mg Oral Daily  . levothyroxine  75 mcg Oral Q0600  . metoprolol succinate  50 mg Oral Daily  . montelukast  10 mg Oral QHS  . sodium chloride flush  3 mL Intravenous Q12H  . torsemide  80 mg Oral BID  . traZODone  150 mg Oral QHS   Continuous Infusions: . sodium chloride     PRN Meds:.sodium chloride, albuterol, butalbital-acetaminophen-caffeine, naphazoline-glycerin, naproxen, ondansetron (ZOFRAN) IV, oxyCODONE-acetaminophen, sodium chloride flush  Diet Orders (From admission, onward)    Start     Ordered   05/12/20 1341  Diet Heart Room service appropriate? Yes; Fluid consistency: Thin; Fluid restriction: 1800 mL Fluid  Diet effective now       Question Answer Comment  Room service appropriate? Yes   Fluid consistency: Thin   Fluid restriction: 1800 mL Fluid      05/12/20 1340          DVT prophylaxis:  apixaban (ELIQUIS) tablet 5 mg     Code Status: Full Code  Family Communication: no family at bedside   Status is: Inpatient  Remains inpatient appropriate because:Inpatient level of care appropriate due to severity of illness   Dispo: The patient is from: Home              Anticipated d/c is to: SNF              Patient currently is medically stable to d/c.   Difficult to place patient Yes  Level of care: Med-Surg  Consultants:  Orthopedic surgery   Procedures:  Shoulder injection  Microbiology  none  Antimicrobials: none    Objective: Vitals:   05/13/20 1700 05/13/20 2017 05/13/20 2306 05/14/20 0401  BP: 123/77 127/85 134/65 124/82  Pulse: 71 77 79 87  Resp: 16 19 (!) 21 18  Temp: 98.5 F (36.9 C) 98.3 F (36.8 C) 97.7 F (36.5 C) 98.2 F (36.8 C)  TempSrc:   Oral   SpO2: 96% 98% 94% 99%  Weight:      Height:        Intake/Output Summary (Last 24 hours) at 05/14/2020 1228 Last data filed at 05/14/2020 1607 Gross per 24 hour  Intake  --  Output 2200 ml  Net -2200 ml   Filed Weights   05/11/20 0449 05/12/20 0432 05/13/20 0500  Weight: 96.6 kg (!) 187.1 kg (!) 187.4 kg    Examination:  Constitutional: NAD Eyes: no scleral icterus ENMT: Mucous membranes are moist.  Neck: normal, supple Respiratory: Distant breath sounds due to body habitus, no wheezing, no crackles Cardiovascular: Regular rate and rhythm, no murmurs / rubs / gallops.  Chronic 1+ LE edema. Abdomen: non distended, no tenderness. Bowel sounds positive.  Musculoskeletal: no clubbing / cyanosis.  Skin: no rashes Neurologic: CN 2-12 grossly intact. Strength 5/5 in all 4.  Psychiatric: Normal judgment and insight. Alert and oriented x 3. Normal mood.   Data Reviewed: I have independently reviewed following labs and imaging studies   CBC: Recent Labs  Lab 05/09/20 0634 05/12/20 0829  WBC 4.1 4.4  HGB 10.1* 10.7*  HCT  31.5* 33.2*  MCV 101.6* 100.9*  PLT 218 270   Basic Metabolic Panel: Recent Labs  Lab 05/09/20 0634 05/11/20 0517 05/12/20 0829 05/14/20 0949  NA 134* 134* 135 137  K 4.7 4.4 4.0 4.5  CL 95* 95* 94* 97*  CO2 30 31 30  32  GLUCOSE 114* 123* 119* 125*  BUN 12 13 12 13   CREATININE 0.69 0.83 0.64 0.79  CALCIUM 8.6* 8.7* 8.7* 8.9  MG 1.9  --  1.8  --    Liver Function Tests: No results for input(s): AST, ALT, ALKPHOS, BILITOT, PROT, ALBUMIN in the last 168 hours. Coagulation Profile: No results for input(s): INR, PROTIME in the last 168 hours. HbA1C: No results for input(s): HGBA1C in the last 72 hours. CBG: No results for input(s): GLUCAP in the last 168 hours.  No results found for this or any previous visit (from the past 240 hour(s)).   Radiology Studies: No results found.  , MD, PhD [facility]  Between 7 am - 7 pm I am available, please contact me via Amion or Securechat  Between 7 pm - 7 am I am not available, please contact night coverage MD/APP via Amion

## 2020-05-14 NOTE — TOC Progression Note (Signed)
Transition of Care [facility]) - Progression Note    Patient Details  Name: [name] [name] MRN: [id number] Date of Birth: [birth year]  Transition of Care [facility]) CM/SW Contact  Chapman Fitch, RN Phone Number: 05/14/2020, 1:14 PM  Clinical Narrative:     Per MD anticipated DC home tomorrow. Still not SNF bed offers.  Patient in agreement with this plan Will need EMS transport Per Kandee Keen with [facility] patient was readmitted prior to being opened, but they can accept him again at discharge        Expected Discharge Plan and Services                                                 Social Determinants of Health (SDOH) Interventions    Readmission Risk Interventions Readmission Risk Prevention Plan 01/11/2020 12/24/2019 01/25/2019  Transportation Screening Complete Complete Complete  PCP or Specialist Appt within 3-5 Days - - -  HRI or Home Care Consult - - -  Social Work Consult for Recovery Care Planning/Counseling - - -  Palliative Care Screening - - -  Medication Review Oceanographer) Complete Complete Complete  PCP or Specialist appointment within 3-5 days of discharge Complete Complete Complete  HRI or Home Care Consult - - Complete  SW Recovery Care/Counseling Consult Complete Complete Complete  Palliative Care Screening Complete Not Applicable Not Applicable  Skilled Nursing Facility Not Applicable Not Applicable Not Applicable

## 2020-05-14 NOTE — Progress Notes (Signed)
Occupational Therapy Treatment Patient Details Name: [name] [name] MRN: [id number] DOB: [birth year] Today's Date: 05/14/2020    History of present illness 60 y.o. male with medical history significant for Class III obesity, BMI over 60, depression, HFrEF with last EF 30 to 35%, COPD, OSA chronic respiratory failure on home O2 at 3 L and on CPAP, A. fib and history of DVT on Eliquis, CAD recently hospitalized from 2/22 - 3/14, then 3/ with complications of Covid pneumonia requiring intubation, rehospitalized from 3/16-3/18, who returns to the emergency room with complaints of protracted weakness, inability to manage at home as well as shortness of breath and orthopnea and increased lower extremity edema.  He denies chest pain, cough fever or chills.  He also has diarrhea but denies abdominal pain, nausea or vomiting.  Patient, who lives alone was apparently discharged on 3/18 after he declined skilled rehab placement.  He states that he would really like to go to rehab but is unwilling to give up his check because he has no other way to pay his bills.   OT comments  Pt seen for OT treatment this date to f/u re; safety with ADLs/ADL mobility. Pt this date able to perform ADL transfers with SUPV with RW, demos good control, but it's noted to be effortful and he requires 2 minutes seated to decreased WOB after coming to one static stand. OT engages pt in below-listed exercise with MIN verbal/visual cues for correct form/pace/technique. OT also educates pt re: AE for peri care and pt will require f/u. Will continue to follow acutely and continue to anticipate that pt will require STR in SNF setting to improve strength, safety and tolerance for fxl HH distances and general self care ADLs.    Follow Up Recommendations  SNF    Equipment Recommendations  None recommended by OT (pt likely with all necessary equipment, defer to next venue)    Recommendations for Other Services      Precautions /  Restrictions Precautions Precautions: Fall Precaution Comments: Watch Rt shoulder Restrictions Weight Bearing Restrictions: No       Mobility Bed Mobility               General bed mobility comments: NT, pt in recliner    Transfers Overall transfer level: Needs assistance Equipment used: Rolling walker (2 wheeled) Transfers: Sit to/from Stand Sit to Stand: Supervision         General transfer comment: supervision to complete transfer with RW, pt reliant on UE support. Effortful d/t pain.    Balance Overall balance assessment: Modified Independent Sitting-balance support: Feet supported Sitting balance-Leahy Scale: Good   Standing balance support: Bilateral upper extremity supported;During functional activity Standing balance-Leahy Scale: Fair                           ADL either performed or assessed with clinical judgement   ADL                                               Vision Patient Visual Report: No change from baseline     Perception     Praxis      Cognition Arousal/Alertness: Awake/alert Behavior During Therapy: WFL for tasks assessed/performed Overall Cognitive Status: Within Functional Limits for tasks assessed  General Comments: Pt is A and O x 4        Exercises Other Exercises Other Exercises: OT engages pt in therex for one set x10 reps contralateral reaching to improve core strength for balance/fall prevention, 2 sets x10 reps modified chair situp, 2 sets x6 reps chair push-up/modified tricep push up (decreased reps d/t pt with chronic R shoudler pain). Pt tolerates well. Requries ~2-3 minute seated rest break between each. Other Exercises: OT engages pt in discussion re: use of bottom buddy for toileting/posterior peri care as pt endorses having trouble reaching and using bidet at home (now unable to make it into restroom with his wheelchair/scooter). Pt with  moderate reception, but would benefit from further education.   Shoulder Instructions       General Comments      Pertinent Vitals/ Pain       Pain Assessment: Faces Faces Pain Scale: Hurts little more Pain Location: L knee, R shoulder (chronic) Pain Descriptors / Indicators: Aching;Tender;Sore Pain Intervention(s): Limited activity within patient's tolerance;Monitored during session  Home Living                                          Prior Functioning/Environment              Frequency  Min 1X/week        Progress Toward Goals  OT Goals(current goals can now be found in the care plan section)  Progress towards OT goals: Progressing toward goals  Acute Rehab OT Goals Patient Stated Goal: To get better and stronger OT Goal Formulation: With patient Time For Goal Achievement: 05/20/20 Potential to Achieve Goals: Fair  Plan Discharge plan remains appropriate;Frequency remains appropriate    Co-evaluation                 AM-PAC OT "6 Clicks" Daily Activity     Outcome Measure   Help from another person eating meals?: None Help from another person taking care of personal grooming?: A Little Help from another person toileting, which includes using toliet, bedpan, or urinal?: Total Help from another person bathing (including washing, rinsing, drying)?: A Lot Help from another person to put on and taking off regular upper body clothing?: A Lot Help from another person to put on and taking off regular lower body clothing?: A Lot 6 Click Score: 14    End of Session Equipment Utilized During Treatment: Rolling walker;Oxygen  OT Visit Diagnosis: Unsteadiness on feet (R26.81);Repeated falls (R29.6);Muscle weakness (generalized) (M62.81)   Activity Tolerance Patient tolerated treatment well   Patient Left in chair;with call bell/phone within reach   Nurse Communication Other (comment)        Time: 4709-6283 OT Time Calculation  (min): 23 min  Charges: OT General Charges $OT Visit: 1 Visit OT Treatments $Self Care/Home Management : 8-22 mins $Therapeutic Exercise: 8-22 mins  Rejeana Brock, MS, OTR/L ascom [phone] 05/14/20, 5:31 PM

## 2020-05-14 NOTE — TOC Progression Note (Signed)
Transition of Care [facility]) - Progression Note    Patient Details  Name: [name] [name] MRN: [id number] Date of Birth: 06-20-1960  [facility] of Care [facility]) CM/SW Contact  Chapman Fitch, RN Phone Number: 05/14/2020, 9:41 AM  Clinical Narrative:    Still no bed offers. Resent out referral to all pending facilities         Expected Discharge Plan and Services                                                 Social Determinants of Health (SDOH) Interventions    Readmission Risk Interventions Readmission Risk Prevention Plan 01/11/2020 12/24/2019 01/25/2019  Transportation Screening Complete Complete Complete  PCP or Specialist Appt within 3-5 Days - - -  HRI or Home Care Consult - - -  Social Work Consult for Recovery Care Planning/Counseling - - -  Palliative Care Screening - - -  Medication Review Oceanographer) Complete Complete Complete  PCP or Specialist appointment within 3-5 days of discharge Complete Complete Complete  HRI or Home Care Consult - - Complete  SW Recovery Care/Counseling Consult Complete Complete Complete  Palliative Care Screening Complete Not Applicable Not Applicable  Skilled Nursing Facility Not Applicable Not Applicable Not Applicable

## 2020-05-14 NOTE — Progress Notes (Signed)
Physical Therapy Treatment Patient Details Name: [name] [name] MRN: [id number] DOB: 1960/10/24 Today's Date: 05/14/2020    History of Present Illness 59 y.o. male with medical history significant for Class III obesity, BMI over 60, depression, HFrEF with last EF 30 to 35%, COPD, OSA chronic respiratory failure on home O2 at 3 L and on CPAP, A. fib and history of DVT on Eliquis, CAD recently hospitalized from 2/22 - 3/14, then 3/ with complications of Covid pneumonia requiring intubation, rehospitalized from 3/16-3/18, who returns to the emergency room with complaints of protracted weakness, inability to manage at home as well as shortness of breath and orthopnea and increased lower extremity edema.  He denies chest pain, cough fever or chills.  He also has diarrhea but denies abdominal pain, nausea or vomiting.  Patient, who lives alone was apparently discharged on 3/18 after he declined skilled rehab placement.  He states that he would really like to go to rehab but is unwilling to give up his check because he has no other way to pay his bills.    PT Comments    Patient alert, in chair. Denied pain initially but reported increased L knee pain with first bout of ambulation, as well as dizziness that resolved with rest. On 3L via [place] throughout session, spO2 >90%. Pt still exhibited deficits in activity tolerance, endurance, and safety, cues required to maximize throughout session. Limited distance compared to previous session due to pt increased fatigue, dizziness, and pain today, but ultimately still ambulated ~51ft total with RW and CGA (seated rest break at 54ft). Pt and PT also reviewed home set up and bottom buddy for ADLs to maximize safety upon returning home. Pt up in chair, all needs in reach. The patient would benefit from further skilled PT intervention to continue to progress towards goals. Recommendation remains appropriate to maximize pt independence, activity tolerance/endurance, and  safety.     Follow Up Recommendations  SNF     Equipment Recommendations  None recommended by PT    Recommendations for Other Services       Precautions / Restrictions Precautions Precautions: Fall Precaution Comments: Watch Rt shoulder Restrictions Weight Bearing Restrictions: No    Mobility  Bed Mobility               General bed mobility comments: NT, pt in recliner    Transfers Overall transfer level: Needs assistance Equipment used: Rolling walker (2 wheeled) Transfers: Sit to/from Stand Sit to Stand: Supervision            Ambulation/Gait Ambulation/Gait assistance: Supervision Gait Distance (Feet):  (62ft, twice) Assistive device: Rolling walker (2 wheeled)   Gait velocity: WNL, actually needs cues to slow down so that he maintains safety   General Gait Details: short step length, steady, with RW. Pt fatigued, SOB and needed seated rest break after ea bout of 65ft. spO2 >90% throughout on 3 L via [place]   Stairs             Wheelchair Mobility    Modified Rankin (Stroke Patients Only)       Balance Overall balance assessment: Modified Independent Sitting-balance support: Feet supported Sitting balance-Leahy Scale: Good Sitting balance - Comments: able to sit unsupported when resting after catching his breath   Standing balance support: Bilateral upper extremity supported;During functional activity Standing balance-Leahy Scale: Fair Standing balance comment: Forward lean with mod support from the RW  Cognition Arousal/Alertness: Awake/alert Behavior During Therapy: WFL for tasks assessed/performed Overall Cognitive Status: Within Functional Limits for tasks assessed                                        Exercises Other Exercises Other Exercises: Patient and PT discussed options of bathroom set up. Pt unable to get RW and WC through bathroom door at home. Discussed BSC, bottom  buddy device, and use of WC for mobility in house as needed    General Comments        Pertinent Vitals/Pain Pain Assessment: Faces Faces Pain Scale: Hurts little more Pain Location: L knee, R shoulder Pain Descriptors / Indicators: Aching;Tender;Sore Pain Intervention(s): Limited activity within patient's tolerance;Monitored during session;Repositioned    Home Living                      Prior Function            PT Goals (current goals can now be found in the care plan section) Progress towards PT goals: Progressing toward goals    Frequency    Min 2X/week      PT Plan Current plan remains appropriate    Co-evaluation              AM-PAC PT "6 Clicks" Mobility   Outcome Measure  Help needed turning from your back to your side while in a flat bed without using bedrails?: A Little Help needed moving from lying on your back to sitting on the side of a flat bed without using bedrails?: A Little Help needed moving to and from a bed to a chair (including a wheelchair)?: A Little Help needed standing up from a chair using your arms (e.g., wheelchair or bedside chair)?: A Little Help needed to walk in hospital room?: A Lot Help needed climbing 3-5 steps with a railing? : Total 6 Click Score: 15    End of Session Equipment Utilized During Treatment: Oxygen Activity Tolerance: Patient limited by fatigue Patient left: in chair;with call bell/phone within reach Nurse Communication: Mobility status PT Visit Diagnosis: Other abnormalities of gait and mobility (R26.89);Muscle weakness (generalized) (M62.81);Difficulty in walking, not elsewhere classified (R26.2);Unsteadiness on feet (R26.81)     Time: 1325-1350 PT Time Calculation (min) (ACUTE ONLY): 25 min  Charges:  $Therapeutic Exercise: 23-37 mins                     Olga Coaster PT, DPT 2:01 PM,05/14/20

## 2020-05-15 ENCOUNTER — Encounter: Payer: Medicaid Other | Admitting: Occupational Therapy

## 2020-05-15 DIAGNOSIS — I10 Essential (primary) hypertension: Secondary | ICD-10-CM | POA: Diagnosis not present

## 2020-05-15 DIAGNOSIS — I482 Chronic atrial fibrillation, unspecified: Secondary | ICD-10-CM | POA: Diagnosis not present

## 2020-05-15 DIAGNOSIS — I2510 Atherosclerotic heart disease of native coronary artery without angina pectoris: Secondary | ICD-10-CM | POA: Diagnosis not present

## 2020-05-15 DIAGNOSIS — J449 Chronic obstructive pulmonary disease, unspecified: Secondary | ICD-10-CM | POA: Diagnosis not present

## 2020-05-15 DIAGNOSIS — Z7901 Long term (current) use of anticoagulants: Secondary | ICD-10-CM | POA: Diagnosis not present

## 2020-05-15 DIAGNOSIS — J9622 Acute and chronic respiratory failure with hypercapnia: Secondary | ICD-10-CM | POA: Diagnosis not present

## 2020-05-15 DIAGNOSIS — I5033 Acute on chronic diastolic (congestive) heart failure: Secondary | ICD-10-CM | POA: Diagnosis not present

## 2020-05-15 DIAGNOSIS — I872 Venous insufficiency (chronic) (peripheral): Secondary | ICD-10-CM | POA: Diagnosis not present

## 2020-05-15 DIAGNOSIS — J9621 Acute and chronic respiratory failure with hypoxia: Secondary | ICD-10-CM | POA: Diagnosis not present

## 2020-05-15 DIAGNOSIS — E039 Hypothyroidism, unspecified: Secondary | ICD-10-CM | POA: Diagnosis not present

## 2020-05-15 NOTE — Progress Notes (Signed)
Occupational Therapy Treatment Patient Details Name: [name] [name] MRN: [id number] DOB: 09-12-1960 Today's Date: 05/15/2020    History of present illness 60 y.o. male with medical history significant for Class III obesity, BMI over 60, depression, HFrEF with last EF 30 to 35%, COPD, OSA chronic respiratory failure on home O2 at 3 L and on CPAP, A. fib and history of DVT on Eliquis, CAD recently hospitalized from 2/22 - 3/14, then 3/ with complications of Covid pneumonia requiring intubation, rehospitalized from 3/16-3/18, who returns to the emergency room with complaints of protracted weakness, inability to manage at home as well as shortness of breath and orthopnea and increased lower extremity edema.  He denies chest pain, cough fever or chills.  He also has diarrhea but denies abdominal pain, nausea or vomiting.  Patient, who lives alone was apparently discharged on 3/18 after he declined skilled rehab placement.  He states that he would really like to go to rehab but is unwilling to give up his check because he has no other way to pay his bills.   OT comments  Mr Bonano was seen for OT treatment on this date. Upon arrival to room pt seated in chair, agreeable to tx. Pt given bottom buddy and instructed in use. Pt return demonstrated proper technique, deferred trial. Pt deferred mobility this date. Instructed on home/routines modifications and d/c recs. Pt making progress toward goals. Pt continues to benefit from skilled OT services to maximize return to PLOF and minimize risk of future falls, injury, caregiver burden, and readmission. Will continue to follow POC. Discharge recommendation remains appropriate.    Follow Up Recommendations  SNF    Equipment Recommendations  None recommended by OT - pt has all equipment, reports needing a new pot for his [facility]   Recommendations for Other Services      Precautions / Restrictions Precautions Precautions: Fall Restrictions Weight Bearing  Restrictions: No       Mobility Bed Mobility               General bed mobility comments: Pt received and left in chair    Transfers                 General transfer comment: pt deferred        ADL either performed or assessed with clinical judgement   ADL Overall ADL's : Needs assistance/impaired                                       General ADL Comments: Pt demonstrates ability to reach rear for pericare c MIN VCs using bottom buddy (provided). Pt defered standing for trial use.               Cognition Arousal/Alertness: Awake/alert Behavior During Therapy: WFL for tasks assessed/performed Overall Cognitive Status: Within Functional Limits for tasks assessed                                          Exercises Exercises: Other exercises Other Exercises Other Exercises: Pt educated re: OT role, DME recs, d/c recs, falls prevention, AE - bottom buddy use           Pertinent Vitals/ Pain       Pain Assessment: No/denies pain         Frequency  Min 1X/week        Progress Toward Goals  OT Goals(current goals can now be found in the care plan section)  Progress towards OT goals: Progressing toward goals  Acute Rehab OT Goals Patient Stated Goal: To get better and stronger OT Goal Formulation: With patient Time For Goal Achievement: 05/20/20 Potential to Achieve Goals: Fair ADL Goals Pt Will Perform Grooming: with supervision;standing Pt Will Perform Lower Body Dressing: with supervision;sit to/from stand Pt Will Transfer to Toilet: ambulating;with min assist Pt Will Perform Toileting - Clothing Manipulation and hygiene: sit to/from stand;with min assist  Plan Discharge plan remains appropriate;Frequency remains appropriate       AM-PAC OT "6 Clicks" Daily Activity     Outcome Measure   Help from another person eating meals?: None Help from another person taking care of personal grooming?: A  Little Help from another person toileting, which includes using toliet, bedpan, or urinal?: A Lot Help from another person bathing (including washing, rinsing, drying)?: A Lot Help from another person to put on and taking off regular upper body clothing?: A Lot Help from another person to put on and taking off regular lower body clothing?: A Lot 6 Click Score: 15    End of Session    OT Visit Diagnosis: Unsteadiness on feet (R26.81);Repeated falls (R29.6);Muscle weakness (generalized) (M62.81)   Activity Tolerance Patient tolerated treatment well   Patient Left in chair;with call bell/phone within reach   Nurse Communication          Time: 7619-5093 OT Time Calculation (min): 10 min  Charges: OT General Charges $OT Visit: 1 Visit OT Treatments $Self Care/Home Management : 8-22 mins  Kathie Dike, M.S. OTR/L  05/15/20, 6:31 PM  ascom [phone]

## 2020-05-15 NOTE — Consult Note (Addendum)
WOC team reconsulted to clairify wound care orders for BLE.   WOC consult was performed on 3/24 and orders were provided for bedside nurses to perform topical treatment; refer to previous notes. Primary team has now ordered bilat Una boots instead and these were applied by the bedside nurse yesterday; 3/30.  Pt states he has been wearing bilat Una boots prior to admission and these are changed weekly.  He has been followed by the outpatient wound care center. Currently, Una boots are in place to BLE. Topical treatment orders provided for bedside nurses as follows: Leave bilat Una boots in place; WOC nurses will change Q Wed. Cammie Mcgee MSN, RN, CWOCN, [place], CNS [phone]

## 2020-05-15 NOTE — Progress Notes (Signed)
PROGRESS NOTE  [name] [name] WVP:[id number] DOB: 1960-08-25 DOA: 05/04/2020 PCP: [name], DO   LOS: 11 days   Brief Narrative / Interim history: [name] Hubbardis a 60 y.o.malewith medical history significant forClass III obesity, BMI over 60, depression, HFrEF with last EF 30 to 35%, COPD, OSA chronic respiratory failure on home O2 at 3 L and on CPAP, A. fib and history of DVT onEliquis, CAD recently hospitalized from 2/22 - 3/14 with complications of Covid pneumonia requiring intubation, rehospitalized from 3/16-3/18, who returns to the emergency room with complaints of protracted weakness, inability to manage at home. Patient, who lives alone was apparently discharged on 3/18 after he declined skilled rehab placement. He states that he would really like to go to rehab but is unwilling to give up his check because he has no other way to pay his bills. Patient had called EMS multiple times since being DC from hospital on 3/18 needing assistance of moving to and from bed to chair and needing assistance with toilet and hygiene. Patient is unable to care for self at home alone.  Subjective / 24h Interval events: Feeling stronger, does not want to go to SNF but feels like he may build to go home tomorrow  Assessment & Plan: Principal Problem Chronic hypoxic and hypercarbic respiratory failure, COPD, OSA on CPAP -patient appears to be at baseline in terms of his respiratory status.  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  He is on 2-3 L at home, satting well on that and remained stable today  Active Problems Chronic lower extremity edema/lymphedema, chronic venous stasis bilateral-he is followed by wound care clinic for Unna boots.  Wound care consulted and evaluated patient, recommending "Wound care to bilateral LEs: Cleanse with soap and water, rinse and pat dry.  Cover left lateral LE full thickness wound and right LE anterior partial thickness lesions with size appropriate pieces of silver hydrofiber (Aquacel Advantage, [place] # [id number]). Top with ABD pads. Secure by wrapping from just below toes to just below knees with Kerlix roll gauze, top with 4-inch Coban (self-adherent wrap) applied in a similar manner. Attempt to elevate LEs while patient is sitting in the chair at intervals (example of goal would be for 1 hour every 4 hours)."  Acute on chronic diastolic CHF-patient was admitted to the hospital with increased lower extremity edema.  He received initially intravenous Lasix, return close to baseline, continue home torsemide  Diarrhea due to adenovirus, C. difficile-in the ER he was started to have frequent watery stools.  C. difficile antigen positive was positive as well.  Started on oral vancomycin, prior hospitalist discussed with ID.  GI pathogen panel was also positive for adenovirus.  He completed a 10-day course of p.o. vancomycin per ID recommendations.  Sepsis ruled out  Chronic A. 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  Could not fit in the MRI machine, and x-ray showed no acute findings.  Orthopedic surgery consulted and he is status post joint injection on 3/22.  Disposition-awaiting SNF however patient feels a little bit stronger and is considering to go home  Obesity-based on a BMI of 62.  Patient will benefit from weight loss  Scheduled Meds: . apixaban  5 mg Oral BID  . atorvastatin  40 mg Oral Daily  . carisoprodol  350 mg Oral TID  . dextromethorphan-guaiFENesin  1  tablet Oral BID  . diclofenac Sodium  4 g Topical QID  . DULoxetine  20 mg Oral Daily  . levothyroxine  75 mcg Oral Q0600  . metoprolol succinate  50 mg Oral Daily  . montelukast  10 mg Oral QHS  . sodium chloride flush  3 mL Intravenous Q12H  . torsemide  80 mg Oral BID  . traZODone  150 mg Oral QHS   Continuous Infusions: . sodium chloride     PRN Meds:.sodium chloride, albuterol, butalbital-acetaminophen-caffeine, naphazoline-glycerin, ondansetron (ZOFRAN) IV, oxyCODONE-acetaminophen, sodium chloride flush  Diet Orders (From admission, onward)    Start     Ordered   05/12/20 1341  Diet Heart Room service appropriate? Yes; Fluid consistency: Thin; Fluid restriction: 1800 mL Fluid  Diet effective now       Question Answer Comment  Room service appropriate? Yes   Fluid consistency: Thin   Fluid restriction: 1800 mL Fluid      05/12/20 1340          DVT prophylaxis:  apixaban (ELIQUIS) tablet 5 mg     Code Status: Full Code  Family Communication: no family at bedside   Status is: Inpatient  Remains inpatient appropriate because:Inpatient level of care appropriate due to severity of illness  Dispo: The patient is from: Home              Anticipated d/c is to: SNF              Patient currently is medically stable to d/c.   Difficult to place patient Yes  Level of care: Med-Surg  Consultants:  Orthopedic surgery   Procedures:  Shoulder injection  Microbiology  none  Antimicrobials: none    Objective: Vitals:   05/14/20 0401 05/14/20 2036 05/15/20 0349 05/15/20 0831  BP: 124/82 108/74 123/78 (!) 142/63  Pulse: 87 67 75 79  Resp: 18 20 20 18   Temp: 98.2 F (36.8 C) 98.4 F (36.9 C) 98 F (36.7 C) 97.9 F (36.6 C)  TempSrc:  Oral Oral Oral  SpO2: 99% 100% 97% 100%  Weight:   (!) 185.9 kg   Height:        Intake/Output Summary (Last 24 hours) at 05/15/2020 1141 Last data filed at 05/15/2020 0559 Gross per 24 hour  Intake 480 ml  Output 1700 ml   Net -1220 ml   Filed Weights   05/12/20 0432 05/13/20 0500 05/15/20 0349  Weight: (!) 187.1 kg (!) 187.4 kg (!) 185.9 kg    Examination:  Constitutional: No distress Eyes: No icterus ENMT: mmm  Neck: normal, supple Respiratory: Distant breath sounds due to body habitus, no wheezing, no crackles Cardiovascular: Regular rate and rhythm, no murmurs, chronic 1+ lower extremity edema.  Unna boots in place Abdomen: Soft, NT, ND, bowel sounds positive Musculoskeletal: no clubbing / cyanosis.  Skin: No rashes appreciated Neurologic: No focal deficits  Data Reviewed: I have independently reviewed following labs and imaging studies   CBC: Recent Labs  Lab 05/09/20 0634 05/12/20 0829  WBC 4.1 4.4  HGB 10.1* 10.7*  HCT 31.5* 33.2*  MCV 101.6* 100.9*  PLT 218 270   Basic Metabolic Panel: Recent Labs  Lab 05/09/20 0634 05/11/20 0517 05/12/20 0829 05/14/20 05/16/20  NA 134* 134* 135 137  K 4.7 4.4 4.0 4.5  CL 95* 95* 94* 97*  CO2 30 31 30  32  GLUCOSE 114* 123* 119* 125*  BUN 12 13 12 13   CREATININE 0.69 0.83 0.64 0.79  CALCIUM 8.6* 8.7* 8.7* 8.9  MG 1.9  --  1.8  --    Liver Function Tests: No results for input(s): AST, ALT, ALKPHOS, BILITOT, PROT, ALBUMIN in the last 168 hours. Coagulation Profile: No results for input(s): INR, PROTIME in the last 168 hours. HbA1C: No results for input(s): HGBA1C in the last 72 hours. CBG: No results for input(s): GLUCAP in the last 168 hours.  No results found for this or any previous visit (from the past 240 hour(s)).   Radiology Studies: No results found.  , MD, PhD [facility]  Between 7 am - 7 pm I am available, please contact me via Amion or Securechat  Between 7 pm - 7 am I am not available, please contact night coverage MD/APP via Amion

## 2020-05-16 DIAGNOSIS — Z7901 Long term (current) use of anticoagulants: Secondary | ICD-10-CM

## 2020-05-16 DIAGNOSIS — J9622 Acute and chronic respiratory failure with hypercapnia: Secondary | ICD-10-CM | POA: Diagnosis not present

## 2020-05-16 DIAGNOSIS — J9621 Acute and chronic respiratory failure with hypoxia: Secondary | ICD-10-CM | POA: Diagnosis not present

## 2020-05-16 DIAGNOSIS — J9610 Chronic respiratory failure, unspecified whether with hypoxia or hypercapnia: Secondary | ICD-10-CM | POA: Diagnosis not present

## 2020-05-16 DIAGNOSIS — I872 Venous insufficiency (chronic) (peripheral): Secondary | ICD-10-CM | POA: Diagnosis not present

## 2020-05-16 DIAGNOSIS — J449 Chronic obstructive pulmonary disease, unspecified: Secondary | ICD-10-CM | POA: Diagnosis not present

## 2020-05-16 DIAGNOSIS — I482 Chronic atrial fibrillation, unspecified: Secondary | ICD-10-CM | POA: Diagnosis not present

## 2020-05-16 DIAGNOSIS — E039 Hypothyroidism, unspecified: Secondary | ICD-10-CM | POA: Diagnosis not present

## 2020-05-16 DIAGNOSIS — I2510 Atherosclerotic heart disease of native coronary artery without angina pectoris: Secondary | ICD-10-CM | POA: Diagnosis not present

## 2020-05-16 DIAGNOSIS — G4733 Obstructive sleep apnea (adult) (pediatric): Secondary | ICD-10-CM | POA: Diagnosis not present

## 2020-05-16 DIAGNOSIS — I10 Essential (primary) hypertension: Secondary | ICD-10-CM | POA: Diagnosis not present

## 2020-05-16 DIAGNOSIS — J984 Other disorders of lung: Secondary | ICD-10-CM | POA: Diagnosis not present

## 2020-05-16 DIAGNOSIS — I5033 Acute on chronic diastolic (congestive) heart failure: Secondary | ICD-10-CM | POA: Diagnosis not present

## 2020-05-16 MED ORDER — OXYCODONE-ACETAMINOPHEN 7.5-325 MG PO TABS: 1.0000 | ORAL_TABLET | Freq: Four times a day (QID) | ORAL | 0 refills | Status: AC | PRN

## 2020-05-16 MED ORDER — METHOCARBAMOL 500 MG PO TABS
500.0000 mg | ORAL_TABLET | Freq: Three times a day (TID) | ORAL | 0 refills | Status: AC | PRN
Start: 2020-05-16 — End: 2020-05-23

## 2020-05-16 NOTE — TOC Transition Note (Addendum)
Transition of Care [facility]) - CM/SW Discharge Note   Patient Details  Name: [name] [name] MRN: [id number] Date of Birth: 1960-04-23  Transition of [facility]) CM/SW Contact:  Liliana Cline, LCSW Phone Number: 05/16/2020, 11:52 AM   Clinical Narrative:     Patient to discharge home today. Spoke to patient who confirmed he needs EMS transport home. Confirmed home address in chart. [facility] arranged for 1:30 pick up, RN aware. Notified Cory with [facility] of DC today, and provided [facility] Main Number to patient per his request. Patient reported he needs a new bucket for his BSC, bucket obtained through [employer] and left with patient DC Folder, asked RN and EMS to make sure this goes home with patient today.   Call from APS Worker Chanel Rose, informed her of DC plan.  No other TOC needs identified.   1:50- [facility] unable to transport. [facility] called for transport home. Made EMS aware patient is on o2, isolation precautions, and has bucket for BSC to take with him. Patient is 3rd on the list as of 1:55.   Final next level of care: Home w Home Health Services Barriers to Discharge: Barriers Resolved   Patient Goals and CMS Choice Patient states their goals for this hospitalization and ongoing recovery are:: home with home health CMS Medicare.gov Compare Post Acute Care list provided to:: Patient Choice offered to / list presented to : Patient  Discharge Placement                Patient to be transferred to facility by: [facility] Name of family member notified: patient Patient and family notified of of transfer: 05/16/20  Discharge Plan and Services                          HH Arranged: PT,Social Work,Nurse's Aide [facility] Agency: [facility] Date [facility] Agency Contacted: 05/16/20   Representative spoke with at [facility] Agency: Lorenza Chick  Social Determinants of Health (SDOH) Interventions     Readmission Risk  Interventions Readmission Risk Prevention Plan 01/11/2020 12/24/2019 01/25/2019  Transportation Screening Complete Complete Complete  PCP or Specialist Appt within 3-5 Days - - -  HRI or Home Care Consult - - -  Social Work Consult for Recovery Care Planning/Counseling - - -  Palliative Care Screening - - -  Medication Review [employer]) Complete Complete Complete  PCP or Specialist appointment within 3-5 days of discharge Complete Complete Complete  HRI or Home Care Consult - - Complete  SW Recovery Care/Counseling Consult Complete Complete Complete  Palliative Care Screening Complete Not Applicable Not Applicable  Skilled Nursing Facility Not Applicable Not Applicable Not Applicable

## 2020-05-16 NOTE — Plan of Care (Signed)

## 2020-05-16 NOTE — Discharge Summary (Signed)
Physician Discharge Summary  [name] [name] FVC:[id number] DOB: [birth year] DOA: 05/04/2020  PCP: [name] Cords, DO  Admit date: 05/04/2020 Discharge date: 05/16/2020  Admitted From: home Disposition:  Home (refused SNF)  Recommendations for Outpatient Follow-up:  1. Follow up with PCP in 1-2 weeks  Home Health: PT, OT, RN Equipment/Devices: walker, bedside commode, home O2  Discharge Condition: stable CODE STATUS: Full code Diet recommendation: heart healthy  HPI: Per admitting MD, [name] Garica is a 60 y.o. male with medical history significant for Class III obesity, BMI over 60, depression, HFrEF with last EF 30 to 35%, COPD, OSA chronic respiratory failure on home O2 at 3 L and on CPAP, A. fib and history of DVT on Eliquis, CAD recently hospitalized from 2/22 - 3/14 with complications of Covid pneumonia requiring intubation, rehospitalized from 3/16-3/18, who returns to the emergency room with complaints of protracted weakness, inability to manage at home as well as shortness of breath and orthopnea and increased lower extremity edema.  He denies chest pain, cough fever or chills.  He also has diarrhea but denies abdominal pain, nausea or vomiting.  Patient, who lives alone was apparently discharged on 3/18 after he declined skilled rehab placement.  He states that he would really like to go to rehab but is unwilling to give up his check because he has no other way to pay his bills.  Hospital Course / Discharge diagnoses: Principal Problem Chronic hypoxic and hypercarbic respiratory failure, COPD, OSA on CPAP -patient appears to be at baseline in terms of his respiratory status.  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  He is on 2-3 L at home, satting well on that and remained stable today.  Active Problems Chronic lower extremity edema/lymphedema, chronic venous  stasis bilateral-he is followed by wound care clinic for Unna boots.  Wound care consulted and evaluated patient, recommending "Wound care to bilateral LEs: Cleanse with soap and water, rinse and pat dry. Cover left lateral LE full thickness wound and right LE anterior partial thickness lesions with size appropriate pieces of silver hydrofiber (Aquacel Advantage, [employer] # [id number]). Top with ABD pads. Secure by wrapping from just below toes to just below knees with Kerlix roll gauze, top with 4-inch Coban (self-adherent wrap) applied in a similar manner. Attempt to elevate LEs while patient is sitting in the chair at intervals (example of goal would be for 1 hour every 4 hours)." Acute on chronic diastolic CHF-patient was admitted to the hospital with increased lower extremity edema.  He received initially intravenous Lasix, return close to baseline, continue home torsemide Diarrhea due to adenovirus, C. difficile-in the ER he was started to have frequent watery stools.  C. difficile antigen positive was positive as well.  Started on oral vancomycin, prior hospitalist discussed with ID.  GI pathogen panel was also positive for adenovirus.  He completed a 10-day course of p.o. vancomycin per ID recommendations.  Sepsis ruled out Chronic A. fib, history of DVT-continue home Toprol, continue anticoagulation with Eliquis CAD-no chest pain, stable, continue home medications Hypothyroidism-continue Synthroid Depression-continue home medications Chronic MSK pain due to history of multiple traumatic injuries - continue home regimen Right shoulder pain, POA - started couple of weeks ago when patient tried to pull himself up in bed.  Could not fit in the MRI machine, and x-ray showed no acute findings.  Orthopedic surgery consulted and he is status post  joint injection on 3/22. Does not appreciate full relief, will prescribe short course of Percocet.  Disposition-SNF was recommended however patient choosing to go  home. High readmission risk.  Obesity-based on a BMI of 62.  Patient will benefit from weight loss  Sepsis ruled out   Discharge Instructions   Allergies as of 05/16/2020   No Known Allergies     Medication List    STOP taking these medications   cefadroxil 500 MG capsule Commonly known as: DURICEF     TAKE these medications   albuterol 108 (90 Base) MCG/ACT inhaler Commonly known as: VENTOLIN HFA Inhale 2 puffs into the lungs every 4 (four) hours as needed for wheezing or shortness of breath.   apixaban 5 MG Tabs tablet Commonly known as: ELIQUIS Take 1 tablet (5 mg total) by mouth 2 (two) times daily.   aspirin 81 MG chewable tablet Chew 1 tablet (81 mg total) by mouth daily.   atorvastatin 40 MG tablet Commonly known as: LIPITOR TAKE 1 TABLET BY MOUTH ONCE DAILY   DULoxetine 20 MG capsule Commonly known as: CYMBALTA Take 1 capsule (20 mg total) by mouth daily.   levothyroxine 75 MCG tablet Commonly known as: SYNTHROID Take 1 tablet (75 mcg total) by mouth daily before breakfast.   lisinopril 2.5 MG tablet Commonly known as: ZESTRIL Take by mouth.   loperamide 2 MG capsule Commonly known as: IMODIUM Take 1 capsule (2 mg total) by mouth as needed for diarrhea or loose stools.   magnesium oxide 400 (241.3 Mg) MG tablet Commonly known as: MAG-OX Take 1 tablet (400 mg total) by mouth 2 (two) times daily.   meclizine 25 MG tablet Commonly known as: ANTIVERT Take 0.5-1 tablets (12.5-25 mg total) by mouth 3 (three) times daily as needed for dizziness.   methocarbamol 500 MG tablet Commonly known as: Robaxin Take 1 tablet (500 mg total) by mouth every 8 (eight) hours as needed for up to 7 days for muscle spasms.   metoprolol succinate 100 MG 24 hr tablet Commonly known as: TOPROL-XL Take 100 mg by mouth daily.   montelukast 10 MG tablet Commonly known as: SINGULAIR Take 1 tablet (10 mg total) by mouth at bedtime.   multivitamin with minerals Tabs  tablet Take 1 tablet by mouth daily.   naphazoline-glycerin 0.012-0.2 % Soln Commonly known as: CLEAR EYES REDNESS Place 1-2 drops into both eyes 4 (four) times daily as needed for eye irritation.   nitroGLYCERIN 0.4 MG SL tablet Commonly known as: NITROSTAT Place 1 tablet under tongue every 5 minutes as needed for chest pain. (No more than 3 doses within 15 minutes)   oxyCODONE-acetaminophen 7.5-325 MG tablet Commonly known as: PERCOCET Take 1 tablet by mouth every 6 (six) hours as needed for up to 5 days for moderate pain.   potassium chloride SA 20 MEQ tablet Commonly known as: KLOR-CON Take 1 tablet (20 mEq total) by mouth daily.   torsemide 20 MG tablet Commonly known as: DEMADEX Take 4 tablets (80 mg total) by mouth 2 (two) times daily.   traZODone 150 MG tablet Commonly known as: DESYREL Take 1 tablet (150 mg total) by mouth at bedtime.            Durable Medical Equipment  (From admission, onward)         Start     Ordered   05/16/20 0750  For home use only DME Bedside commode  Once       Question:  Patient needs  a bedside commode to treat with the following condition  Answer:  Morbid obesity (HCC)   05/16/20 0749           Consultations:  Orthopedic surgery   Procedures/Studies:  DG Shoulder Right  Result Date: 04/20/2020 CLINICAL DATA:  Acute RIGHT shoulder pain. EXAM: RIGHT SHOULDER - 2+ VIEW COMPARISON:  None. FINDINGS: No evidence of acute fracture, subluxation or dislocation. No focal bony lesions are identified. Mild degenerative changes at the [facility] joint noted. IMPRESSION: No acute abnormality. Electronically Signed   By: Harmon Pier M.D.   On: 04/20/2020 13:19   DG Chest Port 1 View  Result Date: 05/04/2020 CLINICAL DATA:  Weakness EXAM: PORTABLE CHEST 1 VIEW COMPARISON:  Radiograph 04/13/2020 FINDINGS: Low lung volumes. Persistent bibasilar opacities are slightly the increased from comparison. Could reflect regions of atelectasis or  consolidation in the setting of suspected sepsis. No visible pleural effusion the portions of the right costophrenic sulci are collimated from view. No pneumothorax. Cardiomegaly is similar to prior. Mild pulmonary vascular congestion. No acute osseous or soft tissue abnormality. Telemetry leads overlie the chest. IMPRESSION: Persistent bibasilar opacities, slightly increased from comparison. Could reflect regions of atelectasis or consolidation in the setting of suspected sepsis. Cardiomegaly and vascular congestion. Early mild edema could present with a similar appearance as well. Electronically Signed   By: Kreg Shropshire M.D.   On: 05/04/2020 02:12   DG Chest Portable 1 View  Result Date: 04/30/2020 CLINICAL DATA:  Weakness.  Evaluate for edema EXAM: PORTABLE CHEST 1 VIEW COMPARISON:  04/13/2020 FINDINGS: Chronic cardiomegaly. Improved vascular congestion from before. Improved atelectasis from 04/06/2020 chest CT. There is no edema, consolidation, effusion, or pneumothorax. Normal aortic and hilar contours. IMPRESSION: 1. Improved aeration when compared to February imaging. 2. Cardiomegaly and borderline vascular congestion. Electronically Signed   By: Marnee Spring M.D.   On: 04/30/2020 07:48      Subjective: - no chest pain, shortness of breath, no abdominal pain, nausea or vomiting.   Discharge Exam: BP 118/74 (BP Location: Left Arm)   Pulse 81   Temp 98.7 F (37.1 C) (Oral)   Resp 20   Ht 5\' 8"  (1.727 m)   Wt (!) 188.1 kg   SpO2 97%   BMI 63.05 kg/m   General: Pt is alert, awake, not in acute distress Cardiovascular: RRR, S1/S2 +, no rubs, no gallops Respiratory: CTA bilaterally, no wheezing, no rhonchi Abdominal: Soft, NT, ND, bowel sounds + Extremities: 1+ chronic edema, Unna boot, no cyanosis  The results of significant diagnostics from this hospitalization (including imaging, microbiology, ancillary and laboratory) are listed below for reference.     Microbiology: No  results found for this or any previous visit (from the past 240 hour(s)).   Labs: Basic Metabolic Panel: Recent Labs  Lab 05/11/20 0517 05/12/20 0829 05/14/20 0949  NA 134* 135 137  K 4.4 4.0 4.5  CL 95* 94* 97*  CO2 31 30 32  GLUCOSE 123* 119* 125*  BUN 13 12 13   CREATININE 0.83 0.64 0.79  CALCIUM 8.7* 8.7* 8.9  MG  --  1.8  --    Liver Function Tests: No results for input(s): AST, ALT, ALKPHOS, BILITOT, PROT, ALBUMIN in the last 168 hours. CBC: Recent Labs  Lab 05/12/20 0829  WBC 4.4  HGB 10.7*  HCT 33.2*  MCV 100.9*  PLT 270   CBG: No results for input(s): GLUCAP in the last 168 hours. Hgb A1c No results for input(s): HGBA1C in the  last 72 hours. Lipid Profile No results for input(s): CHOL, HDL, LDLCALC, TRIG, CHOLHDL, LDLDIRECT in the last 72 hours. Thyroid function studies No results for input(s): TSH, T4TOTAL, T3FREE, THYROIDAB in the last 72 hours.  Invalid input(s): FREET3 Urinalysis    Component Value Date/Time   COLORURINE YELLOW (A) 05/04/2020 0107   APPEARANCEUR HAZY (A) 05/04/2020 0107   APPEARANCEUR Hazy (A) 12/25/2018 1442   LABSPEC 1.015 05/04/2020 0107   PHURINE 5.0 05/04/2020 0107   GLUCOSEU NEGATIVE 05/04/2020 0107   HGBUR NEGATIVE 05/04/2020 0107   BILIRUBINUR NEGATIVE 05/04/2020 0107   BILIRUBINUR Negative 12/25/2018 1442   KETONESUR NEGATIVE 05/04/2020 0107   PROTEINUR NEGATIVE 05/04/2020 0107   NITRITE NEGATIVE 05/04/2020 0107   LEUKOCYTESUR TRACE (A) 05/04/2020 0107    FURTHER DISCHARGE INSTRUCTIONS:   Get Medicines reviewed and adjusted: Please take all your medications with you for your next visit with your Primary MD   Laboratory/radiological data: Please request your Primary MD to go over all hospital tests and procedure/radiological results at the follow up, please ask your Primary MD to get all Hospital records sent to his/her office.   In some cases, they will be blood work, cultures and biopsy results pending at the  time of your discharge. Please request that your primary care M.D. goes through all the records of your hospital data and follows up on these results.   Also Note the following: If you experience worsening of your admission symptoms, develop shortness of breath, life threatening emergency, suicidal or homicidal thoughts you must seek medical attention immediately by calling 911 or calling your MD immediately  if symptoms less severe.   You must read complete instructions/literature along with all the possible adverse reactions/side effects for all the Medicines you take and that have been prescribed to you. Take any new Medicines after you have completely understood and accpet all the possible adverse reactions/side effects.    Do not drive when taking Pain medications or sleeping medications (Benzodaizepines)   Do not take more than prescribed Pain, Sleep and Anxiety Medications. It is not advisable to combine anxiety,sleep and pain medications without talking with your primary care practitioner   Special Instructions: If you have smoked or chewed Tobacco  in the last 2 yrs please stop smoking, stop any regular Alcohol  and or any Recreational drug use.   Wear Seat belts while driving.   Please note: You were cared for by a hospitalist during your hospital stay. Once you are discharged, your primary care physician will handle any further medical issues. Please note that NO REFILLS for any discharge medications will be authorized once you are discharged, as it is imperative that you return to your primary care physician (or establish a relationship with a primary care physician if you do not have one) for your post hospital discharge needs so that they can reassess your need for medications and monitor your lab values.  Time coordinating discharge: 40 minutes  SIGNED:  Pamella Pert, MD, PhD 05/16/2020, 7:50 AM

## 2020-05-20 ENCOUNTER — Ambulatory Visit: Payer: Medicaid Other | Admitting: Licensed Clinical Social Worker

## 2020-05-20 ENCOUNTER — Telehealth: Payer: Self-pay
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
## 2020-05-20 NOTE — Patient Instructions (Signed)
Licensed Clinical Social Worker Visit Information  Goals we discussed today:  Goals Addressed            This Visit's Progress   . SW-Manage My Emotions       Timeframe:  Short-Term Goal Priority:  Medium Start Date:   04/29/20                       Expected End Date:  07/30/20                  Follow Up Date-06/04/20   - begin personal counseling - call and visit an old friend - check out volunteer opportunities - join a support group - laugh; watch a funny movie or comedian - learn and use visualization or guided imagery - perform a random act of kindness - practice relaxation or meditation daily - start or continue a personal journal - talk about feelings with a friend, family or spiritual advisor - practice positive thinking and self-talk    Why is this important?    When you are stressed, down or upset, your body reacts too.   For example, your blood pressure may get higher; you may have a headache or stomachache.   When your emotions get the best of you, your body's ability to fight off cold and flu gets weak.   These steps will help you manage your emotions.     Current Barriers:  . Financial constraints . Limited social support . Level of care concerns . ADL IADL limitations . Mental Health Concerns  . Limited education about how to apply for disability and for insurance coverage* . Limited access to caregiver . Lacks knowledge of community resource: available transportation resources within his area  Clinical Social Work Clinical Goal(s):  [name] Over the next 120 days, patient/caregiver will work with SW to address concerns related to care coordination needs, lack of a stable support network and lack of Brewing technologist. LCSW will assist patient in gaining additional support in order to maintain health and mental health appropriately  . Over the next 120 days, patient will demonstrate improved adherence to self care as evidenced by  implementing healthy self-care into his daily routine such as: attending all medical appointments, deep breathing exercises, taking time for self-reflection, taking medications as prescribed, drinking water and daily exercise to improve mobility and mood.  . Over the next 120 days, patient will work with SW bi-monthly by telephone or in person to reduce or manage symptoms related to stress and anxiety . Over the next 120 days, patient will demonstrate improved health management independence as evidenced by implementing healthy self-care skills and positive support/resource implementation into their daily routine to help cope with stressors and improve overall health and well-being  . Over the next 120 days, patient or caregiver will verbalize basic understanding of depression/stress process and self health management plan as evidenced by his participation in development of long term plan of care and institution of self health management strategies  Interventions: . --Unsuccessful outreach to patient on 04/29/20. Patient was hospitalized from 2/22-3/14 and contacted EMS last night because he was unable to ambulate to the bed. CCM LCSW received incoming call from [place] stating that she visited patient today and will be placing a APS report as he is unable to live own his own and cannot get up or stand. Patient reports having a roommate but no one has ever met this roommate and his care  providers believe that he lives alone. Patient continues to drink alcohol but states that the empty alcohol bottles are his roommates. CCM LCSW will update PCP regarding his level of care concerns. CCM LCSW will update CCM team as well.  . Patient was informed that current CCM LCSW will be leaving position next month and his next CCM Social Work follow up visit will be with another LCSW. Patient was appreciative of support provided and receptive to news . Past update listed above . Patient reported to CCM LCSW that  short term SNF placement was recommended during his recent hospitalization but he declined this and was discharged back home with [facility]. However, he received a letter in the mail stating that Medicaid will not cover [facility] services anymore. Patient continues to have limited support within the home.  . Patient reports that he believes he has a blood clot and has an appointment on 05/23/20 to address this concern. Patient reports stable transportation to this upcoming appointment.  . Patient interviewed and appropriate assessments performed . Provided mental health counseling with regard to grief as patient lost spouse 2 years ago. Active and reflective listening provided.   . Provided patient with information about the disability and insurance enrollment process. . Discussed plans with patient for ongoing care management follow up and provided patient with direct contact information for care management team . Advised patient to consider [facility] for [facility] as he has limited in home support other than his sister and neighbor. However, patient reports not having the right form of Medicaid to gain this service.  . Patient confirms stable transportation to all appointments through [facility] transportation. Patient will contact Medicaid transportation this week to set up transportation arrangements for his upcoming appointments. Patient reports that he has to contact them 3 days in advance and cannot schedule transportation arrangements earlier. CCM LCSW provided positive reinforcement for keeping up with his medical appointments and transportation arrangements.  . Assisted patient/caregiver with obtaining information about health plan benefits . Provided education and assistance to client regarding Advanced Directives. . Provided education to patient regarding level of care options. . Patient main source of support includes his neighbor, sister and sister in law [name]. He reports that his neighbor comes to his  residence daily.  . Patient reports that his SOB is still the same. . Patient is still unable to gain HH. The patient has not gotten his new insurance card which is need for [facility] referral. Patient is agreeable to contact them today to inquire. Number provided to patient. UPDATE- Patient has Medicaid now but received a letter in the mail stating that they will not cover [facility] services.  Patient Self Care Activities:  . Attends all scheduled provider appointments . Calls provider office for new concerns or questions  Please see past updates related to this goal by clicking on the "Past Updates" button in the selected goal         [name], [place], MSW, [email or web address] Phone: [phone]

## 2020-05-26 ENCOUNTER — Other Ambulatory Visit (HOSPITAL_COMMUNITY): Payer: Self-pay

## 2020-05-26 NOTE — Progress Notes (Signed)
Today had a home visit with [facility].  Was not there long, his Aid was there, just finished giving him a bath.  He states wears him out.  He looks a lot better than he did after last discharge.  He states has all his medications, he gets them delivered in pill packs.  Discussed him sitting in a straight chair and not his recliner.  He has legs wrapped but they are sliding down due to swelling.  He states can not get out of recliner but he can this chair.  Discussed importance of elevating his legs.  Discussed the salt shaker on his table, he states some things just needs salt.  He states getting up and around better but not as good as he wants.  He has called 911 for assistance once since discharge.  Asked how PT is going, he states they were denied.  He showed me the paper, this was a denial for hospital admittance for PT.  He states has not heard form [facility].  Visit was cut short because he needed to use bathroom, advised him I would check in on [facility] and let him know.  Contacted [facility] and they advised tried him several times and not able to get a hold of him.  They are calling the wrong number.  She also states referral was sent 3/14 so its too long back.  Advised her Hospital spoke with [facility] on 4/1 and he advised would take him on.  She states will contact [facility] and check to see what they need to do to get him back going.  Will further investigate to get him PT at home.  Will also contact hospital to see what else needs to be done.  Will continue to visit for heart failure.   [name] EMT-Paramedic [phone]

## 2020-05-29 ENCOUNTER — Encounter (HOSPITAL_COMMUNITY): Payer: Self-pay

## 2020-05-29 NOTE — Progress Notes (Signed)
Contacted [facility] back this am, she said it is finally accepted and they will contact him today to set up for Monday.  Ken Bonn also.   [name] EMT-Paramedic [phone]

## 2020-06-04 ENCOUNTER — Telehealth: Payer: Self-pay

## 2020-06-04 DIAGNOSIS — G4733 Obstructive sleep apnea (adult) (pediatric): Secondary | ICD-10-CM | POA: Diagnosis not present

## 2020-06-05 ENCOUNTER — Telehealth: Payer: Self-pay | Admitting: Family Medicine

## 2020-06-05 NOTE — Telephone Encounter (Signed)
I spoke Clayton Lefort and gave verbal orders per Dr Kirtland Bouchard to proceed with home health.

## 2020-06-05 NOTE — Telephone Encounter (Signed)
Please verify that he can begin home health beginning today and I will contact them back. Thank you

## 2020-06-05 NOTE — Telephone Encounter (Signed)
Lemuel from [facility] and stated Pt refused Home health visit and was supposed to be seen Tuesday/ Pt stated he will try again today.   Home Health Verbal Orders - Caller/Agency: Lemuel/ [facility]ol Number: [phone]/ secure vm can be left  Requesting to reschedule the start of care visit for today

## 2020-06-05 NOTE — Telephone Encounter (Signed)
Yes, that is fine, he can start today. Thank you  Saralyn Pilar, DO [facility] 06/05/2020, 9:27 AM

## 2020-06-08 ENCOUNTER — Emergency Department: Payer: Medicaid Other

## 2020-06-08 ENCOUNTER — Inpatient Hospital Stay
Admission: EM | Admit: 2020-06-08 | Discharge: 2020-06-29 | DRG: 189 | Disposition: A | Discharge status: Home / Self Care | Payer: Medicaid Other | Attending: Internal Medicine | Admitting: Internal Medicine

## 2020-06-08 ENCOUNTER — Other Ambulatory Visit: Payer: Self-pay

## 2020-06-08 ENCOUNTER — Encounter: Payer: Self-pay | Admitting: Emergency Medicine

## 2020-06-08 DIAGNOSIS — D6859 Other primary thrombophilia: Secondary | ICD-10-CM | POA: Diagnosis present

## 2020-06-08 DIAGNOSIS — K219 Gastro-esophageal reflux disease without esophagitis: Secondary | ICD-10-CM | POA: Diagnosis present

## 2020-06-08 DIAGNOSIS — L97902 Non-pressure chronic ulcer of unspecified part of unspecified lower leg with fat layer exposed: Secondary | ICD-10-CM

## 2020-06-08 DIAGNOSIS — I110 Hypertensive heart disease with heart failure: Secondary | ICD-10-CM | POA: Diagnosis present

## 2020-06-08 DIAGNOSIS — Z789 Other specified health status: Secondary | ICD-10-CM

## 2020-06-08 DIAGNOSIS — M47814 Spondylosis without myelopathy or radiculopathy, thoracic region: Secondary | ICD-10-CM | POA: Diagnosis not present

## 2020-06-08 DIAGNOSIS — R29898 Other symptoms and signs involving the musculoskeletal system: Secondary | ICD-10-CM | POA: Diagnosis not present

## 2020-06-08 DIAGNOSIS — H9192 Unspecified hearing loss, left ear: Secondary | ICD-10-CM | POA: Diagnosis present

## 2020-06-08 DIAGNOSIS — J441 Chronic obstructive pulmonary disease with (acute) exacerbation: Secondary | ICD-10-CM | POA: Diagnosis present

## 2020-06-08 DIAGNOSIS — I255 Ischemic cardiomyopathy: Secondary | ICD-10-CM | POA: Diagnosis present

## 2020-06-08 DIAGNOSIS — R0902 Hypoxemia: Secondary | ICD-10-CM | POA: Diagnosis not present

## 2020-06-08 DIAGNOSIS — I4819 Other persistent atrial fibrillation: Secondary | ICD-10-CM | POA: Diagnosis present

## 2020-06-08 DIAGNOSIS — Z86711 Personal history of pulmonary embolism: Secondary | ICD-10-CM

## 2020-06-08 DIAGNOSIS — I517 Cardiomegaly: Secondary | ICD-10-CM | POA: Diagnosis not present

## 2020-06-08 DIAGNOSIS — J9811 Atelectasis: Secondary | ICD-10-CM | POA: Diagnosis not present

## 2020-06-08 DIAGNOSIS — Z7901 Long term (current) use of anticoagulants: Secondary | ICD-10-CM

## 2020-06-08 DIAGNOSIS — R1084 Generalized abdominal pain: Secondary | ICD-10-CM | POA: Diagnosis not present

## 2020-06-08 DIAGNOSIS — J9621 Acute and chronic respiratory failure with hypoxia: Principal | ICD-10-CM | POA: Diagnosis present

## 2020-06-08 DIAGNOSIS — Z20822 Contact with and (suspected) exposure to covid-19: Secondary | ICD-10-CM | POA: Diagnosis present

## 2020-06-08 DIAGNOSIS — F3341 Major depressive disorder, recurrent, in partial remission: Secondary | ICD-10-CM | POA: Diagnosis not present

## 2020-06-08 DIAGNOSIS — R5381 Other malaise: Secondary | ICD-10-CM | POA: Diagnosis not present

## 2020-06-08 DIAGNOSIS — I959 Hypotension, unspecified: Secondary | ICD-10-CM | POA: Diagnosis not present

## 2020-06-08 DIAGNOSIS — G4733 Obstructive sleep apnea (adult) (pediatric): Secondary | ICD-10-CM | POA: Diagnosis not present

## 2020-06-08 DIAGNOSIS — R079 Chest pain, unspecified: Secondary | ICD-10-CM

## 2020-06-08 DIAGNOSIS — L03115 Cellulitis of right lower limb: Secondary | ICD-10-CM | POA: Diagnosis present

## 2020-06-08 DIAGNOSIS — R7989 Other specified abnormal findings of blood chemistry: Secondary | ICD-10-CM | POA: Diagnosis present

## 2020-06-08 DIAGNOSIS — A419 Sepsis, unspecified organism: Secondary | ICD-10-CM | POA: Diagnosis not present

## 2020-06-08 DIAGNOSIS — Z6841 Body Mass Index (BMI) 40.0 and over, adult: Secondary | ICD-10-CM

## 2020-06-08 DIAGNOSIS — R627 Adult failure to thrive: Secondary | ICD-10-CM | POA: Diagnosis present

## 2020-06-08 DIAGNOSIS — K5790 Diverticulosis of intestine, part unspecified, without perforation or abscess without bleeding: Secondary | ICD-10-CM | POA: Diagnosis not present

## 2020-06-08 DIAGNOSIS — K5750 Diverticulosis of both small and large intestine without perforation or abscess without bleeding: Secondary | ICD-10-CM | POA: Diagnosis not present

## 2020-06-08 DIAGNOSIS — I5032 Chronic diastolic (congestive) heart failure: Secondary | ICD-10-CM | POA: Diagnosis not present

## 2020-06-08 DIAGNOSIS — W1830XA Fall on same level, unspecified, initial encounter: Secondary | ICD-10-CM | POA: Diagnosis present

## 2020-06-08 DIAGNOSIS — S2231XA Fracture of one rib, right side, initial encounter for closed fracture: Secondary | ICD-10-CM | POA: Diagnosis present

## 2020-06-08 DIAGNOSIS — F10930 Alcohol use, unspecified with withdrawal, uncomplicated: Secondary | ICD-10-CM | POA: Diagnosis present

## 2020-06-08 DIAGNOSIS — E039 Hypothyroidism, unspecified: Secondary | ICD-10-CM | POA: Diagnosis present

## 2020-06-08 DIAGNOSIS — I4891 Unspecified atrial fibrillation: Secondary | ICD-10-CM | POA: Diagnosis not present

## 2020-06-08 DIAGNOSIS — S069X9S Unspecified intracranial injury with loss of consciousness of unspecified duration, sequela: Secondary | ICD-10-CM

## 2020-06-08 DIAGNOSIS — F1010 Alcohol abuse, uncomplicated: Secondary | ICD-10-CM | POA: Diagnosis not present

## 2020-06-08 DIAGNOSIS — I482 Chronic atrial fibrillation, unspecified: Secondary | ICD-10-CM | POA: Diagnosis present

## 2020-06-08 DIAGNOSIS — E785 Hyperlipidemia, unspecified: Secondary | ICD-10-CM | POA: Diagnosis present

## 2020-06-08 DIAGNOSIS — S069XAA Unspecified intracranial injury with loss of consciousness status unknown, initial encounter: Secondary | ICD-10-CM | POA: Diagnosis present

## 2020-06-08 DIAGNOSIS — Z28310 Unvaccinated for covid-19: Secondary | ICD-10-CM

## 2020-06-08 DIAGNOSIS — Z87820 Personal history of traumatic brain injury: Secondary | ICD-10-CM

## 2020-06-08 DIAGNOSIS — E860 Dehydration: Secondary | ICD-10-CM | POA: Diagnosis not present

## 2020-06-08 DIAGNOSIS — J432 Centrilobular emphysema: Secondary | ICD-10-CM | POA: Diagnosis not present

## 2020-06-08 DIAGNOSIS — F411 Generalized anxiety disorder: Secondary | ICD-10-CM | POA: Diagnosis present

## 2020-06-08 DIAGNOSIS — I10 Essential (primary) hypertension: Secondary | ICD-10-CM | POA: Diagnosis not present

## 2020-06-08 DIAGNOSIS — Z79891 Long term (current) use of opiate analgesic: Secondary | ICD-10-CM

## 2020-06-08 DIAGNOSIS — Z9981 Dependence on supplemental oxygen: Secondary | ICD-10-CM

## 2020-06-08 DIAGNOSIS — M25511 Pain in right shoulder: Secondary | ICD-10-CM | POA: Diagnosis not present

## 2020-06-08 DIAGNOSIS — F10230 Alcohol dependence with withdrawal, uncomplicated: Secondary | ICD-10-CM | POA: Diagnosis present

## 2020-06-08 DIAGNOSIS — E1169 Type 2 diabetes mellitus with other specified complication: Secondary | ICD-10-CM

## 2020-06-08 DIAGNOSIS — E871 Hypo-osmolality and hyponatremia: Secondary | ICD-10-CM | POA: Diagnosis not present

## 2020-06-08 DIAGNOSIS — L89151 Pressure ulcer of sacral region, stage 1: Secondary | ICD-10-CM | POA: Diagnosis present

## 2020-06-08 DIAGNOSIS — Z7401 Bed confinement status: Secondary | ICD-10-CM

## 2020-06-08 DIAGNOSIS — I2510 Atherosclerotic heart disease of native coronary artery without angina pectoris: Secondary | ICD-10-CM | POA: Diagnosis not present

## 2020-06-08 DIAGNOSIS — Z8674 Personal history of sudden cardiac arrest: Secondary | ICD-10-CM

## 2020-06-08 DIAGNOSIS — R509 Fever, unspecified: Secondary | ICD-10-CM | POA: Diagnosis not present

## 2020-06-08 DIAGNOSIS — L8990 Pressure ulcer of unspecified site, unspecified stage: Secondary | ICD-10-CM | POA: Diagnosis present

## 2020-06-08 DIAGNOSIS — Z7189 Other specified counseling: Secondary | ICD-10-CM | POA: Diagnosis not present

## 2020-06-08 DIAGNOSIS — E119 Type 2 diabetes mellitus without complications: Secondary | ICD-10-CM | POA: Diagnosis present

## 2020-06-08 DIAGNOSIS — M1A9XX0 Chronic gout, unspecified, without tophus (tophi): Secondary | ICD-10-CM | POA: Diagnosis present

## 2020-06-08 DIAGNOSIS — M1611 Unilateral primary osteoarthritis, right hip: Secondary | ICD-10-CM | POA: Diagnosis not present

## 2020-06-08 DIAGNOSIS — R8271 Bacteriuria: Secondary | ICD-10-CM | POA: Diagnosis not present

## 2020-06-08 DIAGNOSIS — E662 Morbid (severe) obesity with alveolar hypoventilation: Secondary | ICD-10-CM | POA: Diagnosis not present

## 2020-06-08 DIAGNOSIS — R000 Tachycardia, unspecified: Secondary | ICD-10-CM | POA: Diagnosis not present

## 2020-06-08 DIAGNOSIS — M75121 Complete rotator cuff tear or rupture of right shoulder, not specified as traumatic: Secondary | ICD-10-CM | POA: Diagnosis present

## 2020-06-08 DIAGNOSIS — Z66 Do not resuscitate: Secondary | ICD-10-CM | POA: Diagnosis present

## 2020-06-08 DIAGNOSIS — Z515 Encounter for palliative care: Secondary | ICD-10-CM | POA: Diagnosis not present

## 2020-06-08 DIAGNOSIS — L89892 Pressure ulcer of other site, stage 2: Secondary | ICD-10-CM | POA: Diagnosis present

## 2020-06-08 DIAGNOSIS — I5023 Acute on chronic systolic (congestive) heart failure: Secondary | ICD-10-CM | POA: Diagnosis present

## 2020-06-08 DIAGNOSIS — I50812 Chronic right heart failure: Secondary | ICD-10-CM

## 2020-06-08 DIAGNOSIS — F32A Depression, unspecified: Secondary | ICD-10-CM | POA: Diagnosis present

## 2020-06-08 DIAGNOSIS — E876 Hypokalemia: Secondary | ICD-10-CM | POA: Diagnosis not present

## 2020-06-08 DIAGNOSIS — S3991XA Unspecified injury of abdomen, initial encounter: Secondary | ICD-10-CM | POA: Diagnosis not present

## 2020-06-08 DIAGNOSIS — L03116 Cellulitis of left lower limb: Secondary | ICD-10-CM | POA: Diagnosis present

## 2020-06-08 DIAGNOSIS — Z79890 Hormone replacement therapy (postmenopausal): Secondary | ICD-10-CM

## 2020-06-08 DIAGNOSIS — I872 Venous insufficiency (chronic) (peripheral): Secondary | ICD-10-CM | POA: Diagnosis present

## 2020-06-08 DIAGNOSIS — S069X9A Unspecified intracranial injury with loss of consciousness of unspecified duration, initial encounter: Secondary | ICD-10-CM | POA: Diagnosis present

## 2020-06-08 DIAGNOSIS — G894 Chronic pain syndrome: Secondary | ICD-10-CM | POA: Diagnosis present

## 2020-06-08 DIAGNOSIS — Z22322 Carrier or suspected carrier of Methicillin resistant Staphylococcus aureus: Secondary | ICD-10-CM

## 2020-06-08 DIAGNOSIS — D7589 Other specified diseases of blood and blood-forming organs: Secondary | ICD-10-CM | POA: Diagnosis present

## 2020-06-08 DIAGNOSIS — S2241XA Multiple fractures of ribs, right side, initial encounter for closed fracture: Secondary | ICD-10-CM | POA: Diagnosis not present

## 2020-06-08 DIAGNOSIS — D696 Thrombocytopenia, unspecified: Secondary | ICD-10-CM | POA: Diagnosis not present

## 2020-06-08 DIAGNOSIS — J45909 Unspecified asthma, uncomplicated: Secondary | ICD-10-CM | POA: Diagnosis not present

## 2020-06-08 DIAGNOSIS — J449 Chronic obstructive pulmonary disease, unspecified: Secondary | ICD-10-CM | POA: Diagnosis present

## 2020-06-08 DIAGNOSIS — N281 Cyst of kidney, acquired: Secondary | ICD-10-CM | POA: Diagnosis not present

## 2020-06-08 DIAGNOSIS — Z5329 Procedure and treatment not carried out because of patient's decision for other reasons: Secondary | ICD-10-CM | POA: Diagnosis not present

## 2020-06-08 DIAGNOSIS — I509 Heart failure, unspecified: Secondary | ICD-10-CM

## 2020-06-08 DIAGNOSIS — Z79899 Other long term (current) drug therapy: Secondary | ICD-10-CM

## 2020-06-08 DIAGNOSIS — Z7982 Long term (current) use of aspirin: Secondary | ICD-10-CM

## 2020-06-08 DIAGNOSIS — J9622 Acute and chronic respiratory failure with hypercapnia: Secondary | ICD-10-CM | POA: Diagnosis present

## 2020-06-08 DIAGNOSIS — L89313 Pressure ulcer of right buttock, stage 3: Secondary | ICD-10-CM

## 2020-06-08 DIAGNOSIS — S2241XD Multiple fractures of ribs, right side, subsequent encounter for fracture with routine healing: Secondary | ICD-10-CM | POA: Diagnosis not present

## 2020-06-08 DIAGNOSIS — Z86718 Personal history of other venous thrombosis and embolism: Secondary | ICD-10-CM

## 2020-06-08 LAB — MRSA PCR SCREENING: MRSA by PCR: POSITIVE — AB

## 2020-06-08 LAB — URINE DRUG SCREEN, QUALITATIVE (ARMC ONLY)
Amphetamines, Ur Screen: NOT DETECTED
Barbiturates, Ur Screen: NOT DETECTED
Benzodiazepine, Ur Scrn: NOT DETECTED
Cannabinoid 50 Ng, Ur ~~LOC~~: NOT DETECTED
Cocaine Metabolite,Ur ~~LOC~~: NOT DETECTED
MDMA (Ecstasy)Ur Screen: NOT DETECTED
Methadone Scn, Ur: NOT DETECTED
Opiate, Ur Screen: NOT DETECTED
Phencyclidine (PCP) Ur S: NOT DETECTED
Tricyclic, Ur Screen: NOT DETECTED

## 2020-06-08 LAB — RESP PANEL BY RT-PCR (FLU A&B, COVID) ARPGX2
Influenza A by PCR: NEGATIVE
Influenza B by PCR: NEGATIVE
SARS Coronavirus 2 by RT PCR: NEGATIVE

## 2020-06-08 LAB — URINALYSIS, COMPLETE (UACMP) WITH MICROSCOPIC
Bacteria, UA: NONE SEEN
Bilirubin Urine: NEGATIVE
Glucose, UA: NEGATIVE mg/dL
Ketones, ur: NEGATIVE mg/dL
Leukocytes,Ua: NEGATIVE
Nitrite: NEGATIVE
Protein, ur: 100 mg/dL — AB
Specific Gravity, Urine: 1.023 (ref 1.005–1.030)
pH: 5 (ref 5.0–8.0)

## 2020-06-08 LAB — COMPREHENSIVE METABOLIC PANEL
ALT: 12 U/L (ref 0–44)
AST: 25 U/L (ref 15–41)
Albumin: 3.6 g/dL (ref 3.5–5.0)
Alkaline Phosphatase: 82 U/L (ref 38–126)
Anion gap: 19 — ABNORMAL HIGH (ref 5–15)
BUN: 12 mg/dL (ref 6–20)
CO2: 31 mmol/L (ref 22–32)
Calcium: 8.2 mg/dL — ABNORMAL LOW (ref 8.9–10.3)
Chloride: 84 mmol/L — ABNORMAL LOW (ref 98–111)
Creatinine, Ser: 0.85 mg/dL (ref 0.61–1.24)
GFR, Estimated: >60 mL/min (ref >60)
Glucose, Bld: 147 mg/dL — ABNORMAL HIGH (ref 70–99)
Potassium: 3.7 mmol/L (ref 3.5–5.1)
Sodium: 134 mmol/L — ABNORMAL LOW (ref 135–145)
Total Bilirubin: 0.7 mg/dL (ref 0.3–1.2)
Total Protein: 7.4 g/dL (ref 6.5–8.1)

## 2020-06-08 LAB — CBC WITH DIFFERENTIAL/PLATELET
Abs Immature Granulocytes: 0.06 10*3/uL (ref 0.00–0.07)
Basophils Absolute: 0.1 10*3/uL (ref 0.0–0.1)
Basophils Relative: 1 %
Eosinophils Absolute: 0.2 10*3/uL (ref 0.0–0.5)
Eosinophils Relative: 3 %
HCT: 38.6 % — ABNORMAL LOW (ref 39.0–52.0)
Hemoglobin: 12.9 g/dL — ABNORMAL LOW (ref 13.0–17.0)
Immature Granulocytes: 1 %
Lymphocytes Relative: 17 %
Lymphs Abs: 1.4 10*3/uL (ref 0.7–4.0)
MCH: 32.1 pg (ref 26.0–34.0)
MCHC: 33.4 g/dL (ref 30.0–36.0)
MCV: 96 fL (ref 80.0–100.0)
Monocytes Absolute: 0.7 10*3/uL (ref 0.1–1.0)
Monocytes Relative: 9 %
Neutro Abs: 6 10*3/uL (ref 1.7–7.7)
Neutrophils Relative %: 69 %
Platelets: 225 10*3/uL (ref 150–400)
RBC: 4.02 MIL/uL — ABNORMAL LOW (ref 4.22–5.81)
RDW: 16.9 % — ABNORMAL HIGH (ref 11.5–15.5)
WBC: 8.5 10*3/uL (ref 4.0–10.5)
nRBC: 0.2 % (ref 0.0–0.2)

## 2020-06-08 LAB — APTT: aPTT: 29 seconds (ref 24–36)

## 2020-06-08 LAB — LACTIC ACID, PLASMA
Lactic Acid, Venous: 5.1 mmol/L (ref 0.5–1.9)
Lactic Acid, Venous: 4.2 mmol/L (ref 0.5–1.9)
Lactic Acid, Venous: 4.5 mmol/L (ref 0.5–1.9)
Lactic Acid, Venous: 3.9 mmol/L (ref 0.5–1.9)

## 2020-06-08 LAB — PROTIME-INR
INR: 1 (ref 0.8–1.2)
Prothrombin Time: 13.6 seconds (ref 11.4–15.2)

## 2020-06-08 LAB — ETHANOL: Alcohol, Ethyl (B): 149 mg/dL — ABNORMAL HIGH (ref <10)

## 2020-06-08 LAB — SALICYLATE LEVEL: Salicylate Lvl: <7 mg/dL — ABNORMAL LOW (ref 7.0–30.0)

## 2020-06-08 LAB — GLUCOSE, CAPILLARY: Glucose-Capillary: 154 mg/dL — ABNORMAL HIGH (ref 70–99)

## 2020-06-08 LAB — ACETAMINOPHEN LEVEL: Acetaminophen (Tylenol), Serum: <10 ug/mL — ABNORMAL LOW (ref 10–30)

## 2020-06-08 MED ORDER — VANCOMYCIN HCL IN DEXTROSE 1-5 GM/200ML-% IV SOLN
1000.0000 mg | Freq: Once | INTRAVENOUS | Status: AC
  Administered 2020-06-08: 1000 mg via INTRAVENOUS

## 2020-06-08 MED ORDER — METRONIDAZOLE IN NACL 5-0.79 MG/ML-% IV SOLN
500.0000 mg | Freq: Three times a day (TID) | INTRAVENOUS | Status: DC
  Administered 2020-06-08 – 2020-06-09 (×2): 500 mg via INTRAVENOUS
  Filled 2020-06-08 (×4): qty 100

## 2020-06-08 MED ORDER — SODIUM CHLORIDE 0.9 % IV BOLUS (SEPSIS)
1200.0000 mL | Freq: Once | INTRAVENOUS | Status: AC
  Administered 2020-06-08: 1200 mL via INTRAVENOUS

## 2020-06-08 MED ORDER — ONDANSETRON HCL 4 MG/2ML IJ SOLN
4.0000 mg | Freq: Four times a day (QID) | INTRAMUSCULAR | Status: DC | PRN
  Administered 2020-06-09 – 2020-06-10 (×3): 4 mg via INTRAVENOUS
  Filled 2020-06-08 (×3): qty 2

## 2020-06-08 MED ORDER — DILTIAZEM HCL 25 MG/5ML IV SOLN
20.0000 mg | Freq: Once | INTRAVENOUS | Status: AC
  Administered 2020-06-08: 20 mg via INTRAVENOUS
  Filled 2020-06-08: qty 5

## 2020-06-08 MED ORDER — ACETAMINOPHEN 650 MG RE SUPP: 650.0000 mg | Freq: Four times a day (QID) | RECTAL | Status: DC | PRN

## 2020-06-08 MED ORDER — ADULT MULTIVITAMIN W/MINERALS CH
1.0000 | ORAL_TABLET | Freq: Every day | ORAL | Status: DC
  Administered 2020-06-09 – 2020-06-29 (×21): 1 via ORAL
  Filled 2020-06-08 (×21): qty 1

## 2020-06-08 MED ORDER — METOPROLOL SUCCINATE ER 50 MG PO TB24: 100.0000 mg | ORAL_TABLET | Freq: Every day | ORAL | Status: DC

## 2020-06-08 MED ORDER — LEVOTHYROXINE SODIUM 50 MCG PO TABS
75.0000 ug | ORAL_TABLET | Freq: Every day | ORAL | Status: DC
  Administered 2020-06-09 – 2020-06-29 (×19): 75 ug via ORAL
  Filled 2020-06-08: qty 2
  Filled 2020-06-08 (×2): qty 1
  Filled 2020-06-08: qty 2
  Filled 2020-06-08: qty 1
  Filled 2020-06-08 (×5): qty 2
  Filled 2020-06-08: qty 1
  Filled 2020-06-08 (×2): qty 2
  Filled 2020-06-08 (×4): qty 1
  Filled 2020-06-08 (×2): qty 2
  Filled 2020-06-08: qty 1

## 2020-06-08 MED ORDER — THIAMINE HCL 100 MG/ML IJ SOLN
100.0000 mg | Freq: Every day | INTRAMUSCULAR | Status: DC
  Administered 2020-06-08: 100 mg via INTRAVENOUS
  Filled 2020-06-08 (×5): qty 2

## 2020-06-08 MED ORDER — SODIUM CHLORIDE 0.9 % IV SOLN
2.0000 g | Freq: Once | INTRAVENOUS | Status: AC
  Administered 2020-06-08: 2 g via INTRAVENOUS
  Filled 2020-06-08: qty 2

## 2020-06-08 MED ORDER — ALBUTEROL SULFATE HFA 108 (90 BASE) MCG/ACT IN AERS
2.0000 | INHALATION_SPRAY | RESPIRATORY_TRACT | Status: DC | PRN
  Filled 2020-06-08: qty 6.7

## 2020-06-08 MED ORDER — METRONIDAZOLE IN NACL 5-0.79 MG/ML-% IV SOLN
500.0000 mg | Freq: Once | INTRAVENOUS | Status: AC
  Administered 2020-06-08: 500 mg via INTRAVENOUS
  Filled 2020-06-08: qty 100

## 2020-06-08 MED ORDER — SODIUM CHLORIDE 0.9 % IV BOLUS (SEPSIS)
1000.0000 mL | Freq: Once | INTRAVENOUS | Status: AC
  Administered 2020-06-08: 1000 mL via INTRAVENOUS

## 2020-06-08 MED ORDER — THIAMINE HCL 100 MG PO TABS
100.0000 mg | ORAL_TABLET | Freq: Every day | ORAL | Status: DC
  Administered 2020-06-09 – 2020-06-29 (×21): 100 mg via ORAL
  Filled 2020-06-08 (×22): qty 1

## 2020-06-08 MED ORDER — ASPIRIN 81 MG PO CHEW
81.0000 mg | CHEWABLE_TABLET | Freq: Every day | ORAL | Status: DC
  Administered 2020-06-09 – 2020-06-29 (×21): 81 mg via ORAL
  Filled 2020-06-08 (×21): qty 1

## 2020-06-08 MED ORDER — CHLORHEXIDINE GLUCONATE CLOTH 2 % EX PADS
6.0000 | MEDICATED_PAD | Freq: Every day | CUTANEOUS | Status: DC
  Administered 2020-06-08 – 2020-06-24 (×13): 6 via TOPICAL

## 2020-06-08 MED ORDER — DULOXETINE HCL 20 MG PO CPEP
20.0000 mg | ORAL_CAPSULE | Freq: Every day | ORAL | Status: DC
  Administered 2020-06-09 – 2020-06-29 (×21): 20 mg via ORAL
  Filled 2020-06-08 (×22): qty 1

## 2020-06-08 MED ORDER — ACETAMINOPHEN 325 MG PO TABS
650.0000 mg | ORAL_TABLET | Freq: Four times a day (QID) | ORAL | Status: DC | PRN
  Administered 2020-06-08 – 2020-06-09 (×2): 650 mg via ORAL
  Filled 2020-06-08 (×2): qty 2

## 2020-06-08 MED ORDER — SODIUM CHLORIDE 0.9 % IV SOLN: INTRAVENOUS | Status: AC

## 2020-06-08 MED ORDER — ONDANSETRON HCL 4 MG PO TABS
4.0000 mg | ORAL_TABLET | Freq: Four times a day (QID) | ORAL | Status: DC | PRN
Start: 2020-06-08 — End: 2020-06-29

## 2020-06-08 MED ORDER — LORAZEPAM 2 MG/ML IJ SOLN
0.0000 mg | Freq: Two times a day (BID) | INTRAMUSCULAR | Status: AC
  Administered 2020-06-10: 2 mg via INTRAVENOUS
  Filled 2020-06-08: qty 1

## 2020-06-08 MED ORDER — LORAZEPAM 2 MG PO TABS
0.0000 mg | ORAL_TABLET | Freq: Four times a day (QID) | ORAL | Status: AC
Start: 2020-06-08 — End: 2020-06-10
  Administered 2020-06-08 – 2020-06-09 (×3): 1 mg via ORAL
  Administered 2020-06-10: 2 mg via ORAL
  Filled 2020-06-08 (×4): qty 1

## 2020-06-08 MED ORDER — DILTIAZEM HCL-DEXTROSE 125-5 MG/125ML-% IV SOLN (PREMIX)
5.0000 mg/h | INTRAVENOUS | Status: DC
  Administered 2020-06-08: 5 mg/h via INTRAVENOUS
  Administered 2020-06-08: 15 mg/h via INTRAVENOUS
  Administered 2020-06-09: 10 mg/h via INTRAVENOUS
  Administered 2020-06-09: 7.5 mg/h via INTRAVENOUS
  Administered 2020-06-10 – 2020-06-11 (×2): 5 mg/h via INTRAVENOUS
  Filled 2020-06-08 (×5): qty 125

## 2020-06-08 MED ORDER — METRONIDAZOLE IN NACL 5-0.79 MG/ML-% IV SOLN
500.0000 mg | Freq: Three times a day (TID) | INTRAVENOUS | Status: DC
Start: 2020-06-08 — End: 2020-06-08

## 2020-06-08 MED ORDER — LORAZEPAM 2 MG PO TABS
0.0000 mg | ORAL_TABLET | Freq: Two times a day (BID) | ORAL | Status: AC
  Administered 2020-06-12: 1 mg via ORAL

## 2020-06-08 MED ORDER — VANCOMYCIN HCL 1750 MG/350ML IV SOLN
1750.0000 mg | Freq: Two times a day (BID) | INTRAVENOUS | Status: DC
  Administered 2020-06-09: 1750 mg via INTRAVENOUS
  Filled 2020-06-08 (×3): qty 350

## 2020-06-08 MED ORDER — VANCOMYCIN HCL IN DEXTROSE 1-5 GM/200ML-% IV SOLN
1000.0000 mg | Freq: Once | INTRAVENOUS | Status: DC
  Filled 2020-06-08: qty 200

## 2020-06-08 MED ORDER — MECLIZINE HCL 25 MG PO TABS
12.5000 mg | ORAL_TABLET | Freq: Three times a day (TID) | ORAL | Status: DC | PRN
  Filled 2020-06-08: qty 1

## 2020-06-08 MED ORDER — IOHEXOL 300 MG/ML  SOLN
100.0000 mL | Freq: Once | INTRAMUSCULAR | Status: AC | PRN
  Administered 2020-06-08: 100 mL via INTRAVENOUS

## 2020-06-08 MED ORDER — APIXABAN 5 MG PO TABS
5.0000 mg | ORAL_TABLET | Freq: Two times a day (BID) | ORAL | Status: DC
  Administered 2020-06-08 – 2020-06-29 (×42): 5 mg via ORAL
  Filled 2020-06-08 (×43): qty 1

## 2020-06-08 MED ORDER — MUPIROCIN 2 % EX OINT
1.0000 "application " | TOPICAL_OINTMENT | Freq: Two times a day (BID) | CUTANEOUS | Status: AC
  Administered 2020-06-09 – 2020-06-13 (×9): 1 via NASAL
  Filled 2020-06-08: qty 22

## 2020-06-08 MED ORDER — METOPROLOL TARTRATE 5 MG/5ML IV SOLN
5.0000 mg | Freq: Once | INTRAVENOUS | Status: AC
  Administered 2020-06-08: 5 mg via INTRAVENOUS
  Filled 2020-06-08: qty 5

## 2020-06-08 MED ORDER — LISINOPRIL 2.5 MG PO TABS
2.5000 mg | ORAL_TABLET | Freq: Every day | ORAL | Status: DC
  Administered 2020-06-09 – 2020-06-29 (×21): 2.5 mg via ORAL
  Filled 2020-06-08 (×21): qty 1

## 2020-06-08 MED ORDER — MIDODRINE HCL 5 MG PO TABS
10.0000 mg | ORAL_TABLET | Freq: Four times a day (QID) | ORAL | Status: DC | PRN
  Filled 2020-06-08: qty 2

## 2020-06-08 MED ORDER — LORAZEPAM 2 MG/ML IJ SOLN
0.0000 mg | Freq: Four times a day (QID) | INTRAMUSCULAR | Status: AC
  Administered 2020-06-08: 2 mg via INTRAVENOUS
  Filled 2020-06-08: qty 1
  Filled 2020-06-08: qty 2

## 2020-06-08 MED ORDER — ATORVASTATIN CALCIUM 20 MG PO TABS
40.0000 mg | ORAL_TABLET | Freq: Every day | ORAL | Status: DC
  Administered 2020-06-09 – 2020-06-29 (×21): 40 mg via ORAL
  Filled 2020-06-08 (×20): qty 2

## 2020-06-08 MED ORDER — NITROGLYCERIN 0.4 MG SL SUBL: 0.4000 mg | SUBLINGUAL_TABLET | SUBLINGUAL | Status: DC | PRN

## 2020-06-08 MED ORDER — METOPROLOL TARTRATE 5 MG/5ML IV SOLN
5.0000 mg | INTRAVENOUS | Status: DC | PRN
Start: 2020-06-08 — End: 2020-06-29

## 2020-06-08 MED ORDER — SODIUM CHLORIDE 0.9 % IV SOLN
2.0000 g | Freq: Three times a day (TID) | INTRAVENOUS | Status: DC
  Administered 2020-06-08 – 2020-06-09 (×2): 2 g via INTRAVENOUS
  Filled 2020-06-08 (×4): qty 2

## 2020-06-08 MED ORDER — MONTELUKAST SODIUM 10 MG PO TABS
10.0000 mg | ORAL_TABLET | Freq: Every day | ORAL | Status: DC
  Administered 2020-06-08 – 2020-06-28 (×21): 10 mg via ORAL
  Filled 2020-06-08 (×23): qty 1

## 2020-06-08 MED ORDER — VANCOMYCIN HCL 1500 MG/300ML IV SOLN
1500.0000 mg | Freq: Once | INTRAVENOUS | Status: DC
  Filled 2020-06-08: qty 300

## 2020-06-08 NOTE — ED Notes (Signed)
Report given to Tessa, RN

## 2020-06-08 NOTE — Progress Notes (Signed)
Patient transported to icu on 4 liters. Placed patient back on bipap once transitioned into icu bed due to sob from moving. Tolerated interventions well

## 2020-06-08 NOTE — Progress Notes (Signed)
PHARMACY -  BRIEF ANTIBIOTIC NOTE   Pharmacy has received consult(s) for vancomycin and cefepime from an ED provider.  The patient's profile has been reviewed for ht/wt/allergies/indication/available labs.    One time order(s) placed for: Cefepime 2g Vancomycin 1g  Further antibiotics/pharmacy consults should be ordered by admitting physician if indicated.                       Raiford Noble, PharmD Pharmacy Resident  06/08/2020 11:16 AM

## 2020-06-08 NOTE — H&P (Addendum)
History and Physical   [name] [name] FUX:[id number] DOB: [birth year] DOA: 06/08/2020  PCP: [name], DO  Outpatient Specialists:  Patient coming from: Home via EMS  I have personally briefly reviewed patient's old medical records in [place].  Chief Concern: Abdominal pain  HPI: [name] [name] is a 60 y.o. 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  He denies head trauma and loss of consciousness.  He endorses abdominal pain.  He told EMS that he did not want to go to the hospital and that he just wants to be left alone to die.  At bedside, he denies fever, chest pain, changes to his short baseline shortness of breath.  She denies dysuria and hematuria.  He endorses nausea, and denies vomiting.  He endorses to me that he drank alcohol 4 to 5 hours prior to presentation to the emergency department.  Denies SI and HI.  He was able to tell me his name, his age, current president is Wille Glaser, current calendar year is 2022 and he knows that he is at [employer].  Social history: He lives at home by himself, he is bedbound, and disabled.  He denies tobacco use.  He endorses drinking half a gallon of whiskey per day.  He denies recreational drug use.  Vaccination: Patient is not vaccinated for COVID-19  ROS: Constitutional: no weight change, no fever ENT/Mouth: no sore throat, no rhinorrhea Eyes: no eye pain, no vision  changes Cardiovascular: no chest pain, no dyspnea,  no edema, no palpitations Respiratory: no cough, no sputum, no wheezing Gastrointestinal: no nausea, no vomiting, no diarrhea, no constipation Genitourinary: no urinary incontinence, no dysuria, no hematuria Musculoskeletal: no arthralgias, no myalgias Skin: no skin lesions, no pruritus, Neuro: + weakness, no loss of consciousness, no syncope Psych: + anxiety, + depression, + decrease appetite Heme/Lymph: no bruising, no bleeding  ED Course: Discussed with ED provider, patient requiring hospitalization due to possible sepsis.  Vitals in the emergency department was remarkable for temperature of 97.7, respiration rate 21, heart rate 79, blood pressure 121/74, SPO2 of 94% on 3 L nasal cannula.  Labs in the emergency department was remarkable for lactic of 5.1, sodium 134, potassium 3.7, chloride 84, bicarb 31, BUN 12, serum creatinine 0.85, nonfasting blood glucose 147, WBC 8.5, hemoglobin 12.9, platelets 225.  ED provider states that EMS told him that patient told EMS that he just wanted to be left alone to die.  He is status post 1 L bolus and 1.2 L bolus, and of normal saline, broad-spectrum antibiotic with vancomycin, metronidazole, cefepime.  He has been given diltiazem GTT.  Assessment/Plan  Active Problems:   Centrilobular emphysema (HCC)   Sepsis (HCC)   Chronic atrial fibrillation (HCC)   HTN (hypertension)   Chronic gout involving toe, unspecified cause, unspecified laterality   TBI (traumatic brain injury) ([place])   Personal history of DVT (deep vein thrombosis)   Atrial fibrillation with RVR (HCC)   Major depressive disorder, recurrent episode, in partial remission ([place])   HLD (hyperlipidemia)   Depression   Pressure ulcer   Alcohol  abuse   Alcohol withdrawal syndrome without complication (HCC)   GAD (generalized anxiety disorder)   COPD (chronic obstructive pulmonary disease) (HCC)   Atrial fibrillation, chronic  (HCC)   Class 3 obesity with alveolar hypoventilation, serious comorbidity, and body mass index (BMI) of 60.0 to 69.9 in adult [facility])   CHF (congestive heart failure) (HCC)   Weakness of both lower extremities   Physical deconditioning   Elevated lactic acid level   Unable to care for self   Mr. [name] [name] is a 60 year old male who has chronically ill patient with severe chronic physical deconditioning, unable to care for himself, morbid obesity, bedbound, major depressive disorder, atrial fibrillation on chronic anticoagulation, COPD with centrilobular emphysema, alcohol abuse, chronic debility, failure to thrive, generalized anxiety, multiple ulcers, hypertension, hypothyroid, presents to the emergency department via EMS for chief concerns of abdominal pain after a fall.  # Sepsis-suspect secondary to source of skin # Met sepsis criteria lower elevated lactic may be secondary to chronic alcohol use and acute intoxication - Elevated lactic acid of 5.1, improved to 4.2 - UA was negative for leukocytes, nitrites positive for small amounts of hemoglobin, protein of 100 - Blood cultures x2 pending and in process - Pro-Cal pending, COVID-negative - Continue broad-spectrum antibiotic with vancomycin, cefepime, metronidazole - Status post 1 L bolus per ED provider, patient is in the process of getting 1.2 L bolus per EDP - Continue diltiazem drip  # Chronic respiratory failure- BiPAP continuously ordered, admit to stepdown # Hypotension- increased risk of decompensation, mid to stepdown  # Atrial fibrillation with RVR- multifactorial, suspect secondary to acute intoxication versus alcohol withdrawal versus sepsis -Continue diltiazem drip - Resumed home metoprolol succinate 100 mg p.o. daily - Checking EtOH, salicylate acid level, acetaminophen, UDS -For sepsis, we will treat as above  # Right sixth rib fracture-subacute to chronic, symptomatic management, incentive spirometry, flutter  valve  # Alcohol use with likely intoxication, checking ethanol level # Anxiety- requesting BiPAP mask however patient is satting at 97% on 4 L nasal cannula at bedside, bicarb 31, no increased respiratory effort -RT communication to place him on a nonrebreather for patient comfort - CIWA protocol initiated - Fall precautions  # No prolonged QT- multifactorial including suspect secondary to alcohol intoxication, sepsis, atrial fibrillation with RVR -Treat as above  # Hypertension-resume lisinopril 2.5 mg daily, metoprolol succinate 100 mg daily  # Depression-resume duloxetine 20 mg daily  # Hypothyroid-levothyroxine 75 mcg q. before breakfast  # Morbid obesity  # OSA and obesity hypoventilation syndrome- BiPAP nightly ordered  # Multiple skin rashes of the lower extremity-present on admission  BMP, CBC in the a.m.  As needed medications: Acetaminophen, Ativan, ondansetron, meclizine, midodrine, Lopressor  Chart reviewed.   Echo on 04/07/2020: EF of 30 to 35%, moderately to severely decreased left ventricular function, left ventricular endocardial border not optimally defined to evaluate regional wall motion.  Right ventricular systolic function is normal.  Left ventricular diastolic function is normal.  DVT prophylaxis: Eliquis 5 mg twice daily Code Status: DNR Diet: Heart healthy/carb modified Family Communication: No Disposition Plan: Pending clinical course Consults called: None at this time Admission status: Inpatient, stepdown, with telemetry, 24 hours ordered  Past Medical History:  Diagnosis Date  . Allergy   . Anxiety   . Arthritis   . Asthma   . Brain damage   . Chronic pain of both knees 07/13/2018  . Clotting disorder ([place])   . COPD (chronic obstructive pulmonary disease) ([place])   .  Depression   . GERD (gastroesophageal reflux disease)   . HFrEF (heart failure with reduced ejection fraction) ([place])    a. 03/2018 Echo: EF 25-30%, diff HK. Mod LAE.  [name] History  of DVT (deep vein thrombosis)   . History of pulmonary embolism    a. Chronic coumadin.  [name] Hypertension   . MI (myocardial infarction) ([place])   . Morbid obesity ([place])   . Neck pain 07/13/2018  . NICM (nonischemic cardiomyopathy) ([place])    a. s/p Cath x 3 - reportedly nl cors. Last cath 2019 in [place]; b. a. 03/2018 Echo: EF 25-30%, diff HK.  [name] Persistent atrial fibrillation ([place])    a. 03/2018 s/p DCCV; b. CHA2DS2VASc = 1-->Xarelto (later changed to warfarin); c. 05/2018 recurrent afib-->Amio initiated.  . Sleep apnea   . Sleep apnea    Past Surgical History:  Procedure Laterality Date  . CARDIOVERSION N/A 03/24/2018   Procedure: CARDIOVERSION;  Surgeon: Minna Merritts, MD;  Location: [facility] ORS;  Service: Cardiovascular;  Laterality: N/A;  . CARDIOVERSION N/A 08/08/2018   Procedure: CARDIOVERSION;  Surgeon: Nelva Bush, MD;  Location: [facility] ORS;  Service: Cardiovascular;  Laterality: N/A;  . hearnia repair     X 3- total of two surgeries  . HERNIA REPAIR    . LEG SURGERY     Social History:  reports that he has never smoked. He has never used smokeless tobacco. He reports previous alcohol use. He reports that he does not use drugs.  No Known Allergies Family History  Problem Relation Age of Onset  . Heart failure Mother   . Lung cancer Mother   . Lung cancer Father   . Heart attack Maternal Grandmother   . Heart attack Maternal Grandfather    Family history: Family history reviewed and not pertinent  Prior to Admission medications   Medication Sig Start Date End Date Taking? Authorizing Provider  albuterol (VENTOLIN HFA) 108 (90 Base) MCG/ACT inhaler Inhale 2 puffs into the lungs every 4 (four) hours as needed for wheezing or shortness of breath. 04/28/20   Sreenath, Trula Slade, MD  apixaban (ELIQUIS) 5 MG TABS tablet Take 1 tablet (5 mg total) by mouth 2 (two) times daily. 04/28/20   Sidney Ace, MD  aspirin 81 MG chewable tablet Chew 1 tablet (81 mg total) by mouth  daily. Patient not taking: Reported on 04/30/2020 02/03/20   Nicole Kindred A, DO  atorvastatin (LIPITOR) 40 MG tablet TAKE 1 TABLET BY MOUTH ONCE DAILY Patient taking differently: Take 40 mg by mouth daily. 08/15/19   Flinchum, Kelby Aline, FNP  DULoxetine (CYMBALTA) 20 MG capsule Take 1 capsule (20 mg total) by mouth daily. 10/25/19   Karamalegos, Devonne Doughty, DO  levothyroxine (SYNTHROID) 75 MCG tablet Take 1 tablet (75 mcg total) by mouth daily before breakfast. 01/13/20   Fritzi Mandes, MD  lisinopril (ZESTRIL) 2.5 MG tablet Take by mouth. 03/06/19   [provider]  loperamide (IMODIUM) 2 MG capsule Take 1 capsule (2 mg total) by mouth as needed for diarrhea or loose stools. Patient not taking: Reported on 04/30/2020 06/26/19   Lorella Nimrod, MD  magnesium oxide (MAG-OX) 400 (241.3 Mg) MG tablet Take 1 tablet (400 mg total) by mouth 2 (two) times daily. Patient not taking: Reported on 04/30/2020 01/12/20   Fritzi Mandes, MD  meclizine (ANTIVERT) 25 MG tablet Take 0.5-1 tablets (12.5-25 mg total) by mouth 3 (three) times daily as needed for dizziness. 02/26/20   [name], DO  metoprolol succinate (TOPROL-XL) 100 MG 24 hr tablet Take 100 mg by mouth daily. 03/17/20   [provider]  montelukast (SINGULAIR) 10 MG tablet Take 1 tablet (10 mg total) by mouth at bedtime. 10/12/19   Karamalegos, Devonne Doughty, DO  Multiple Vitamin (MULTIVITAMIN WITH MINERALS) TABS tablet Take 1 tablet by mouth daily.    [provider]  naphazoline-glycerin (CLEAR EYES REDNESS) 0.012-0.2 % SOLN Place 1-2 drops into both eyes 4 (four) times daily as needed for eye irritation. 08/14/19   Loletha Grayer, MD  nitroGLYCERIN (NITROSTAT) 0.4 MG SL tablet Place 1 tablet under tongue every 5 minutes as needed for chest pain. (No more than 3 doses within 15 minutes) 03/08/19   Flinchum, Kelby Aline, FNP  potassium chloride SA (KLOR-CON) 20 MEQ tablet Take 1 tablet (20 mEq total) by mouth daily.  05/02/20   Danford, Suann Larry, MD  torsemide (DEMADEX) 20 MG tablet Take 4 tablets (80 mg total) by mouth 2 (two) times daily. Patient not taking: Reported on 04/30/2020 02/02/20   Nicole Kindred A, DO  traZODone (DESYREL) 150 MG tablet Take 1 tablet (150 mg total) by mouth at bedtime. 10/25/19   [name], DO   Physical Exam: Vitals:   06/08/20 0930 06/08/20 1030 06/08/20 1100 06/08/20 1430  BP: (!) 142/97 (!) 141/76 (!) 148/92 121/75  Pulse: (!) 110 (!) 120 (!) 108 71  Resp: 20 17 17  (!) 23  Temp:      TempSrc:      SpO2: 92% 94% 97% 97%  Weight:      Height:       Constitutional: appears older than chronological age, chronically ill, NAD, calm, comfortable Eyes: PERRL, lids and conjunctivae normal ENMT: Mucous membranes are moist. Posterior pharynx clear of any exudate or lesions. Age-appropriate dentition.  Mild hearing loss Neck: normal, supple, no masses, no thyromegaly Respiratory: clear to auscultation bilaterally, no wheezing, no crackles. Normal respiratory effort. No accessory muscle use.  Cardiovascular: Regular rate and rhythm, no murmurs / rubs / gallops. No extremity edema. 2+ pedal pulses. No carotid bruits.  Abdomen: Morbid obesity, no tenderness, no masses palpated, no hepatosplenomegaly. Bowel sounds positive.  Musculoskeletal: no clubbing / cyanosis. No joint deformity upper and lower extremities. Good ROM, no contractures, no atrophy. Normal muscle tone.  Skin: Diffuse bilateral lower extremity skin rashes as documented Neurologic: Sensation intact. Strength 5/5 in all 4.  Psychiatric: Alert and oriented x 3. Depressed mood.  EKG: independently reviewed, showing atrial fibrillation with a rate of 121, QTc 500  Chest x-ray on Admission: I personally reviewed and I agree with radiologist reading as below.  CT CHEST ABDOMEN PELVIS W CONTRAST  Result Date: 06/08/2020 CLINICAL DATA:  Fall this morning with abdominal pain. EXAM: CT CHEST, ABDOMEN, AND  PELVIS WITH CONTRAST TECHNIQUE: Multidetector CT imaging of the chest, abdomen and pelvis was performed following the standard protocol during bolus administration of intravenous contrast. CONTRAST:  117m OMNIPAQUE IOHEXOL 300 MG/ML  SOLN COMPARISON:  Same day chest radiograph, chest CT dated 04/06/2020, abdominal ultrasound dated 03/10/2019. FINDINGS: CT CHEST FINDINGS Cardiovascular: Vascular calcifications are seen in the coronary arteries and aortic arch. The heart is mildly enlarged. No pericardial effusion. Mediastinum/Nodes: No enlarged mediastinal, hilar, or axillary lymph nodes. Thyroid gland, trachea, and esophagus demonstrate no significant findings. Lungs/Pleura: There is a mild left lower lobe atelectasis. The right lung is clear. There is no pleural effusion or pneumothorax. Musculoskeletal: There is a subacute to chronic fracture of the anterior right  sixth rib. This appears new since 04/06/2020. Degenerative changes are seen in the spine, most significant at T8-9. CT ABDOMEN PELVIS FINDINGS Hepatobiliary: The liver is hypoattenuating, consistent with hepatic steatosis. No focal liver abnormality is seen. No gallstones, gallbladder wall thickening, or biliary dilatation. Pancreas: Unremarkable. No pancreatic ductal dilatation or surrounding inflammatory changes. Spleen: Normal in size without focal abnormality. Adrenals/Urinary Tract: Adrenal glands are unremarkable. Kidneys are normal, without renal calculi, focal lesion, or hydronephrosis. Bladder is unremarkable. Stomach/Bowel: Stomach is within normal limits. Appendix appears normal. There is colonic diverticulosis without evidence of diverticulitis. No evidence of bowel wall thickening, distention, or inflammatory changes. Vascular/Lymphatic: Aortic atherosclerosis. No enlarged abdominal or pelvic lymph nodes. Reproductive: Prostate is unremarkable. Other: No abdominal wall hernia or abnormality. No abdominopelvic ascites. Musculoskeletal:  Degenerative changes are seen in the spine, most significant at L1-2 and L3-4. Severe degenerative changes are seen in the right hip. IMPRESSION: 1. Subacute to chronic fracture of the anterior right sixth rib. 2. Hepatic steatosis. Aortic Atherosclerosis (ICD10-I70.0). Electronically Signed   By: Zerita Boers M.D.   On: 06/08/2020 11:55   DG Chest Port 1 View  Result Date: 06/08/2020 CLINICAL DATA:  Questionable sepsis EXAM: PORTABLE CHEST 1 VIEW COMPARISON:  05/04/2020 FINDINGS: Cardiomegaly and vascular congestion, chronic. No Kerley lines. No air bronchogram. Hazy right apical pleural thickening which is stable. IMPRESSION: Cardiomegaly and vascular congestion. Electronically Signed   By: Monte Fantasia M.D.   On: 06/08/2020 09:54   Labs on Admission: I have personally reviewed following labs  CBC: Recent Labs  Lab 06/08/20 0919  WBC 8.5  NEUTROABS 6.0  HGB 12.9*  HCT 38.6*  MCV 96.0  PLT 937   Basic Metabolic Panel: Recent Labs  Lab 06/08/20 0919  NA 134*  K 3.7  CL 84*  CO2 31  GLUCOSE 147*  BUN 12  CREATININE 0.85  CALCIUM 8.2*   GFR: Estimated Creatinine Clearance: 152.7 mL/min (by C-G formula based on SCr of 0.85 mg/dL).  Liver Function Tests: Recent Labs  Lab 06/08/20 0919  AST 25  ALT 12  ALKPHOS 82  BILITOT 0.7  PROT 7.4  ALBUMIN 3.6   Coagulation Profile: Recent Labs  Lab 06/08/20 0919  INR 1.0   Urine analysis:    Component Value Date/Time   COLORURINE AMBER (A) 06/08/2020 0919   APPEARANCEUR HAZY (A) 06/08/2020 0919   APPEARANCEUR Hazy (A) 12/25/2018 1442   LABSPEC 1.023 06/08/2020 0919   PHURINE 5.0 06/08/2020 0919   GLUCOSEU NEGATIVE 06/08/2020 0919   HGBUR SMALL (A) 06/08/2020 0919   BILIRUBINUR NEGATIVE 06/08/2020 0919   BILIRUBINUR Negative 12/25/2018 1442   KETONESUR NEGATIVE 06/08/2020 0919   PROTEINUR 100 (A) 06/08/2020 0919   NITRITE NEGATIVE 06/08/2020 0919   LEUKOCYTESUR NEGATIVE 06/08/2020 0919   CRITICAL  CARE Performed by: Briant Cedar Damyiah Moxley  Total critical care time: 40 minutes  Critical care time was exclusive of separately billable procedures and treating other patients.  Critical care was necessary to treat or prevent imminent or life-threatening deterioration. Circulatory failure, alcohol withdrawal, sepsis with hypotension  Critical care was time spent personally by me on the following activities: development of treatment plan with patient and/or surrogate as well as nursing, discussions with consultants, evaluation of patient's response to treatment, examination of patient, obtaining history from patient or surrogate, ordering and performing treatments and interventions, ordering and review of laboratory studies, ordering and review of radiographic studies, pulse oximetry and re-evaluation of patient's condition.  Galia Rahm N Sahan Pen D.O. [facility]  If 7PM-7AM, please contact overnight-coverage provider If 7AM-7PM, please contact day coverage provider [email or web address]  06/08/2020, 3:28 PM

## 2020-06-08 NOTE — ED Notes (Signed)
Patient transported to CT 

## 2020-06-08 NOTE — ED Notes (Signed)
IV team unsuccessful. MD at bedside attempting second [place] line

## 2020-06-08 NOTE — ED Notes (Signed)
Pt back from CT

## 2020-06-08 NOTE — ED Triage Notes (Addendum)
Pt via EMS from home. Pt had a fall this morning. Pt called EMS for lift assistance. Pt states he normally walks. Per EMS, living conditions are poor, urine all over the floor and food everywhere. Pt wear 3L [place] chronically but when EMS found him he did not have it on. RA sat was in the 80s. Pt have weeping legs and the wraps around his shins that havent been changed since 3/30. Pt has sacral wounds. Pt is A&Ox4 and NAD.
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
## 2020-06-08 NOTE — Consult Note (Addendum)
CODE SEPSIS - PHARMACY COMMUNICATION  **Broad Spectrum Antibiotics should be administered within 1 hour of Sepsis diagnosis**  Time Code Sepsis Called/Page Received: 1047  Antibiotics Ordered:  Cefepime 2g IV x1 Vancomycin 1g IV x1 Metronidazole 500mg  IV x1  Time of 1st antibiotic administration: 1147   , PharmD Pharmacy Resident  06/08/2020 11:18 AM

## 2020-06-08 NOTE — ED Notes (Signed)
RN still waiting on IV team for second line

## 2020-06-08 NOTE — Progress Notes (Signed)
elink following sepsis 

## 2020-06-08 NOTE — ED Notes (Signed)
IV team at bedside 

## 2020-06-08 NOTE — Consult Note (Signed)
Pharmacy Antibiotic Note  [name] [name] is a 60 y.o. male admitted on 06/08/2020 following fall. Patient complaining of generalized weakness and generalized body aches ongoing for the last several days. Patient with medical history of afib, alcohol abuse, and bilateral lower leg lymphedema -- with last wrap chage 05/14/20.  Pharmacy has been consulted for cefepime and vancomycin dosing for sepsis of unknown source.   Plan:  Cefepime 2 gram Q8H  Vancomycin 2500 mg X 1 LD   Initiate vancomycin maintenance regimen of 1750 mg Q12H. Goal AUC 400-550  Expected AUC: 473  Scr used: 0.85   Follow renal function, cultures for antibiotic de-escalation   Height: 5\' 8"  (172.7 cm) Weight: (!) 186 kg (410 lb) IBW/kg (Calculated) : 68.4  Temp (24hrs), Avg:97.7 F (36.5 C), Min:97.7 F (36.5 C), Max:97.7 F (36.5 C)  Recent Labs  Lab 06/08/20 0919  WBC 8.5  CREATININE 0.85  LATICACIDVEN 5.1*    Estimated Creatinine Clearance: 152.7 mL/min (by C-G formula based on SCr of 0.85 mg/dL).    No Known Allergies  Antimicrobials this admission: 4/24 cefpeime >>  4/24 vancomycin >>  4/24 metronidazole >>  Dose adjustments this admission: n/a  Microbiology results: 4/24 BCx: sent 4/24 UCx: sent    Thank you for allowing pharmacy to be a part of this patient's care.  5/24, PharmD, BCPS 06/08/2020 2:48 PM

## 2020-06-09 DIAGNOSIS — Z515 Encounter for palliative care: Secondary | ICD-10-CM | POA: Diagnosis not present

## 2020-06-09 DIAGNOSIS — J432 Centrilobular emphysema: Secondary | ICD-10-CM | POA: Diagnosis not present

## 2020-06-09 DIAGNOSIS — Z7189 Other specified counseling: Secondary | ICD-10-CM

## 2020-06-09 DIAGNOSIS — I50812 Chronic right heart failure: Secondary | ICD-10-CM | POA: Diagnosis not present

## 2020-06-09 DIAGNOSIS — I4891 Unspecified atrial fibrillation: Secondary | ICD-10-CM | POA: Diagnosis not present

## 2020-06-09 LAB — CBC
HCT: 37.7 % — ABNORMAL LOW (ref 39.0–52.0)
Hemoglobin: 12.3 g/dL — ABNORMAL LOW (ref 13.0–17.0)
MCH: 31.8 pg (ref 26.0–34.0)
MCHC: 32.6 g/dL (ref 30.0–36.0)
MCV: 97.4 fL (ref 80.0–100.0)
Platelets: 161 10*3/uL (ref 150–400)
RBC: 3.87 MIL/uL — ABNORMAL LOW (ref 4.22–5.81)
RDW: 16.7 % — ABNORMAL HIGH (ref 11.5–15.5)
WBC: 9.7 10*3/uL (ref 4.0–10.5)
nRBC: 0 % (ref 0.0–0.2)

## 2020-06-09 LAB — BASIC METABOLIC PANEL
Anion gap: 15 (ref 5–15)
BUN: 11 mg/dL (ref 6–20)
CO2: 31 mmol/L (ref 22–32)
Calcium: 8.1 mg/dL — ABNORMAL LOW (ref 8.9–10.3)
Chloride: 90 mmol/L — ABNORMAL LOW (ref 98–111)
Creatinine, Ser: 0.52 mg/dL — ABNORMAL LOW (ref 0.61–1.24)
GFR, Estimated: >60 mL/min (ref >60)
Glucose, Bld: 162 mg/dL — ABNORMAL HIGH (ref 70–99)
Potassium: 3.4 mmol/L — ABNORMAL LOW (ref 3.5–5.1)
Sodium: 136 mmol/L (ref 135–145)

## 2020-06-09 LAB — MAGNESIUM: Magnesium: 1.3 mg/dL — ABNORMAL LOW (ref 1.7–2.4)

## 2020-06-09 LAB — BRAIN NATRIURETIC PEPTIDE: B Natriuretic Peptide: 372 pg/mL — ABNORMAL HIGH (ref 0.0–100.0)

## 2020-06-09 LAB — LACTIC ACID, PLASMA
Lactic Acid, Venous: 1.6 mmol/L (ref 0.5–1.9)
Lactic Acid, Venous: 1.6 mmol/L (ref 0.5–1.9)

## 2020-06-09 LAB — PROTIME-INR
INR: 1.1 (ref 0.8–1.2)
Prothrombin Time: 13.8 seconds (ref 11.4–15.2)

## 2020-06-09 LAB — PHOSPHORUS: Phosphorus: 2.4 mg/dL — ABNORMAL LOW (ref 2.5–4.6)

## 2020-06-09 LAB — PROCALCITONIN: Procalcitonin: <0.1 ng/mL

## 2020-06-09 MED ORDER — GABAPENTIN 300 MG PO CAPS
300.0000 mg | ORAL_CAPSULE | Freq: Three times a day (TID) | ORAL | Status: DC
  Administered 2020-06-09 – 2020-06-24 (×26): 300 mg via ORAL
  Filled 2020-06-09 (×38): qty 1

## 2020-06-09 MED ORDER — ACETAMINOPHEN 650 MG RE SUPP: 650.0000 mg | Freq: Four times a day (QID) | RECTAL | Status: DC | PRN

## 2020-06-09 MED ORDER — METHOCARBAMOL 500 MG PO TABS
500.0000 mg | ORAL_TABLET | Freq: Three times a day (TID) | ORAL | Status: DC
  Administered 2020-06-09 – 2020-06-10 (×4): 500 mg via ORAL
  Filled 2020-06-09 (×4): qty 1

## 2020-06-09 MED ORDER — LORAZEPAM 1 MG PO TABS
0.0000 mg | ORAL_TABLET | Freq: Three times a day (TID) | ORAL | Status: AC
  Filled 2020-06-09: qty 1

## 2020-06-09 MED ORDER — POTASSIUM CHLORIDE CRYS ER 20 MEQ PO TBCR
40.0000 meq | EXTENDED_RELEASE_TABLET | Freq: Once | ORAL | Status: AC
  Administered 2020-06-09: 40 meq via ORAL
  Filled 2020-06-09: qty 2

## 2020-06-09 MED ORDER — LORAZEPAM 1 MG PO TABS
1.0000 mg | ORAL_TABLET | ORAL | Status: AC | PRN
Start: 2020-06-09 — End: 2020-06-12
  Filled 2020-06-09: qty 1

## 2020-06-09 MED ORDER — ALUM & MAG HYDROXIDE-SIMETH 200-200-20 MG/5ML PO SUSP
30.0000 mL | Freq: Four times a day (QID) | ORAL | Status: DC | PRN
  Administered 2020-06-09 – 2020-06-10 (×2): 30 mL via ORAL
  Filled 2020-06-09 (×2): qty 30

## 2020-06-09 MED ORDER — FOLIC ACID 1 MG PO TABS
1.0000 mg | ORAL_TABLET | Freq: Every day | ORAL | Status: DC
  Administered 2020-06-09 – 2020-06-17 (×9): 1 mg via ORAL
  Filled 2020-06-09 (×9): qty 1

## 2020-06-09 MED ORDER — METOPROLOL TARTRATE 50 MG PO TABS
50.0000 mg | ORAL_TABLET | Freq: Two times a day (BID) | ORAL | Status: DC
  Administered 2020-06-09 – 2020-06-11 (×5): 50 mg via ORAL
  Filled 2020-06-09 (×5): qty 1

## 2020-06-09 MED ORDER — FUROSEMIDE 10 MG/ML IJ SOLN
40.0000 mg | Freq: Two times a day (BID) | INTRAMUSCULAR | Status: DC
  Administered 2020-06-09 – 2020-06-11 (×5): 40 mg via INTRAVENOUS
  Filled 2020-06-09 (×5): qty 4

## 2020-06-09 MED ORDER — LORAZEPAM 2 MG/ML IJ SOLN
1.0000 mg | INTRAMUSCULAR | Status: AC | PRN
  Administered 2020-06-09: 4 mg via INTRAVENOUS
  Administered 2020-06-09 – 2020-06-10 (×2): 2 mg via INTRAVENOUS
  Filled 2020-06-09 (×2): qty 1

## 2020-06-09 MED ORDER — OXYCODONE-ACETAMINOPHEN 7.5-325 MG PO TABS
1.0000 | ORAL_TABLET | ORAL | Status: DC | PRN
  Administered 2020-06-09 – 2020-06-10 (×3): 1 via ORAL
  Filled 2020-06-09 (×3): qty 1

## 2020-06-09 MED ORDER — THIAMINE HCL 100 MG/ML IJ SOLN: 100.0000 mg | Freq: Every day | INTRAMUSCULAR | Status: DC

## 2020-06-09 MED ORDER — THIAMINE HCL 100 MG PO TABS: 100.0000 mg | ORAL_TABLET | Freq: Every day | ORAL | Status: DC

## 2020-06-09 MED ORDER — LORAZEPAM 1 MG PO TABS
0.0000 mg | ORAL_TABLET | ORAL | Status: AC
  Administered 2020-06-09 – 2020-06-10 (×2): 2 mg via ORAL
  Filled 2020-06-09: qty 2
  Filled 2020-06-09: qty 1

## 2020-06-09 MED ORDER — MAGNESIUM SULFATE 2 GM/50ML IV SOLN
2.0000 g | Freq: Once | INTRAVENOUS | Status: AC
  Administered 2020-06-09: 2 g via INTRAVENOUS
  Filled 2020-06-09: qty 50

## 2020-06-09 MED ORDER — ACETAMINOPHEN 325 MG PO TABS
650.0000 mg | ORAL_TABLET | Freq: Four times a day (QID) | ORAL | Status: DC | PRN
  Administered 2020-06-10: 650 mg via ORAL
  Filled 2020-06-09: qty 2

## 2020-06-09 NOTE — Consult Note (Signed)
PHARMACY CONSULT NOTE  Pharmacy Consult for Electrolyte Monitoring and Replacement   Recent Labs: Potassium (mmol/L)  Date Value  06/09/2020 3.4 (L)   Magnesium (mg/dL)  Date Value  08/65/7846 1.3 (L)   Calcium (mg/dL)  Date Value  96/29/5284 8.1 (L)   Albumin (g/dL)  Date Value  13/24/4010 3.6  05/02/2019 3.8   Phosphorus (mg/dL)  Date Value  27/25/3664 2.4 (L)   Sodium (mmol/L)  Date Value  06/09/2020 136  06/06/2019 139    Assessment: Patient is a 60 y/o M with medical history including morbid obesity, CHF, chronic hypoxic respiratory failure, COPD, depression / anxiety, alcohol use disorder, atrial fibrillation, VTE who presented to the ED 4/24 after fall in setting of alcohol use. Pharmacy consulted to assist with electrolyte monitoring and replacement as indicated.  Diuretics: IV Lasix 40 mg BID  Goal of Therapy:  K > 4 Mg > 2 All other electrolytes within normal limits  Plan:  4/25 AM labs: Na 136, K 3.4, Phos 2.4, Mg 1.3, Scr 0.52 --KCl 40 mEq PO x 1 per provider --IV magnesium sulfate 4 g x 1 per provider --Will follow-up electrolytes with AM labs tomorrow  Tressie Ellis 06/09/2020 1:23 PM

## 2020-06-09 NOTE — Progress Notes (Signed)
OT Cancellation Note  Patient Details Name: [name] [name] MRN: [id number] DOB: 14-Aug-1960   Cancelled Treatment:    Reason Eval/Treat Not Completed: Medical issues which prohibited therapy. Consult received, chart reviewed. Pt noted with recent HR in 120's, BP 137/101, and on BiPAP. Will hold OT evaluation and re-attempt at later date/time as more medically appropriate.   Wynona Canes, MPH, MS, OTR/L ascom [phone] 06/09/20, 10:33 AM

## 2020-06-09 NOTE — Progress Notes (Signed)
PROGRESS NOTE    [name] [name]  WJX:[id number] DOB: [birth year] DOA: 06/08/2020 PCP: [name], DO   Brief Narrative:  60 y.o. 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  He denies head trauma and loss of consciousness.  He endorses abdominal pain.  He told EMS that he did not want to go to the hospital and that he just wants to be left alone to die.  At bedside, he denies fever, chest pain, changes to his short baseline shortness of breath.  She denies dysuria and hematuria.  He endorses nausea, and denies vomiting.  He endorses to me that he drank alcohol 4 to 5 hours prior to presentation to the emergency department.  Denies SI and HI.  Presentation consistent with acute on chronic respiratory failure likely secondary to COPD/CHF.  Remains stepdown status.  Was on BiPAP since admission, taken off on 4/25.  Tolerating nasal cannula okay but increased work of breathing.   Assessment & Plan:   Active Problems:   Centrilobular emphysema (HCC)   Sepsis (HCC)   Chronic atrial fibrillation (HCC)   HTN (hypertension)   Chronic gout involving toe, unspecified cause, unspecified laterality   TBI (traumatic brain injury) (HCC)   Personal history of DVT (deep vein thrombosis)   Atrial fibrillation with RVR (HCC)   Major depressive disorder, recurrent episode, in partial remission (HCC)   HLD (hyperlipidemia)   Depression    Pressure ulcer   Alcohol abuse   Alcohol withdrawal syndrome without complication (HCC)   GAD (generalized anxiety disorder)   COPD (chronic obstructive pulmonary disease) (HCC)   Atrial fibrillation, chronic (HCC)   Class 3 obesity with alveolar hypoventilation, serious comorbidity, and body mass index (BMI) of 60.0 to 69.9 in adult [facility])   CHF (congestive heart failure) (HCC)   Weakness of both lower extremities   Physical deconditioning   Elevated lactic acid level   Unable to care for self  SIRS criteria Sepsis ruled out Lactic acid downtrending Urinalysis not overtly concerning for infection Blood cultures no growth to date . plan: Check procalcitonin Hold all antibiotics for now Follow vitals and fever curve  Acute on chronic respiratory failure Will likely need further BiPAP therapy Respiratory failure likely due to decompensated CHF/COPD Plan: BiPAP as needed Supplemental oxygen as needed Diuretics Nebulizer Pulmonary toileting  Atrial fibrillation with rapid ventricular response Multifactorial Alcohol withdrawal versus acute intoxication Remains dependent on diltiazem drip Plan: Continue stepdown status for now Continue dilt drip, wean as tolerated Resume metoprolol titrate 50 mg twice daily Convert to succinate when rate better controlled No IV fluids  Alcohol use, suspect intoxication CIWA protocol initiated Patient drinks half gallon of whiskey a day High risk for withdrawals  Right sixth rib fracture Acute to subacute Symptomatic management  Hypertension Lisinopril 2.5 mg daily Metoprolol tartrate 50 mg twice daily  Depression Duloxetine 20 mg daily  Hypothyroid Synthroid 75 micrograms daily  OSA/OHS Nightly BiPAP  Morbid obesity This complicates overall care and prognosis  Multiple medical comorbidities Failure to thrive Patient has number of medical comorbidities and frequent readmissions.  He has expressed desires to not pursue  aggressive medical intervention.  Despite his young age I feel he may be a candidate for home hospice services to prevent future readmissions which would be against his wishes. Plan: Plan of care consultation for goals of care      DVT prophylaxis: Eliquis Code Status: DNR Family Communication: None today Disposition Plan: Status is: Inpatient  Remains inpatient appropriate because:Inpatient level of care appropriate due to severity of illness   Dispo: The patient is from: Home              Anticipated d/c is to: Undetermined at this time              Patient currently is not medically stable to d/c.   Difficult to place patient No  Patient with acute on chronic hypoxic and hypercarbic respiratory failure.  Multifactorial in origin.  Also atrial fibrillation with rapid ventricular response.  Several days prior to disposition planning.    . Level of care: Stepdown  Consultants:   None   Procedures: None  Antimicrobials:   None   Subjective: Seen and examined.  Sleepy on BiPAP this morning.  When revisited in the afternoon was stable on 4 L however had increased work of breathing.  Objective: Vitals:   06/09/20 1000 06/09/20 1100 06/09/20 1200 06/09/20 1300  BP:  (!) 143/86 123/79 138/83  Pulse: (!) 121 88  79  Resp:      Temp:      TempSrc:      SpO2:      Weight:      Height:        Intake/Output Summary (Last 24 hours) at 06/09/2020 1459 Last data filed at 06/09/2020 1400 Gross per 24 hour  Intake 3185.22 ml  Output 2200 ml  Net 985.22 ml   Filed Weights   06/08/20 0913 06/08/20 2045  Weight: (!) 186 kg (!) 178.9 kg    Examination:  General exam: Appears work of breathing.  Anxious Respiratory system: Tachypneic, scattered crackles, increased work of breathing, 3 L Cardiovascular system: Tachycardic, irregular rhythm, no murmurs Gastrointestinal system: Obese, nontender, nondistended, normal bowel sounds Central nervous system: Alert and  oriented, lethargic, no focal deficits  extremities: Diffusely decreased power bilaterally. Skin: Dry skin, scattered excoriations Psychiatry: Judgement and insight appear impaired. Mood & affect flattened.     Data Reviewed: I have personally reviewed following labs and imaging studies  CBC: Recent Labs  Lab 06/08/20 0919 06/09/20 0053  WBC 8.5 9.7  NEUTROABS 6.0  --   HGB 12.9* 12.3*  HCT 38.6* 37.7*  MCV 96.0 97.4  PLT 225 161   Basic Metabolic Panel: Recent Labs  Lab 06/08/20 0919 06/09/20 0053  NA 134* 136  K 3.7 3.4*  CL 84* 90*  CO2 31 31  GLUCOSE 147* 162*  BUN 12 11  CREATININE 0.85 0.52*  CALCIUM 8.2* 8.1*  MG  --  1.3*  PHOS  --  2.4*   GFR: Estimated Creatinine Clearance: 158.3 mL/min (A) (by C-G formula based on SCr of 0.52 mg/dL (L)). Liver Function Tests: Recent Labs  Lab 06/08/20 0919  AST 25  ALT 12  ALKPHOS 82  BILITOT 0.7  PROT 7.4  ALBUMIN 3.6   No results for input(s): LIPASE, AMYLASE in the last 168 hours. No results for input(s): AMMONIA in the last 168 hours. Coagulation Profile: Recent Labs  Lab 06/08/20 0919 06/09/20 0053  INR 1.0 1.1   Cardiac Enzymes: No results  for input(s): CKTOTAL, CKMB, CKMBINDEX, TROPONINI in the last 168 hours. BNP (last 3 results) No results for input(s): PROBNP in the last 8760 hours. HbA1C: No results for input(s): HGBA1C in the last 72 hours. CBG: Recent Labs  Lab 06/08/20 2045  GLUCAP 154*   Lipid Profile: No results for input(s): CHOL, HDL, LDLCALC, TRIG, CHOLHDL, LDLDIRECT in the last 72 hours. Thyroid Function Tests: No results for input(s): TSH, T4TOTAL, FREET4, T3FREE, THYROIDAB in the last 72 hours. Anemia Panel: No results for input(s): VITAMINB12, FOLATE, FERRITIN, TIBC, IRON, RETICCTPCT in the last 72 hours. Sepsis Labs: Recent Labs  Lab 06/08/20 1538 06/08/20 2045 06/09/20 0053 06/09/20 0834 06/09/20 1131  PROCALCITON  --   --  <0.10  --   --   LATICACIDVEN 4.5* 3.9*   --  1.6 1.6    Recent Results (from the past 240 hour(s))  Blood culture (routine single)     Status: None (Preliminary result)   Collection Time: 06/08/20  9:19 AM   Specimen: BLOOD  Result Value Ref Range Status   Specimen Description BLOOD RIGHT [facility]  Final   Special Requests   Final    BOTTLES DRAWN AEROBIC AND ANAEROBIC Blood Culture results may not be optimal due to an inadequate volume of blood received in culture bottles   Culture   Final    NO GROWTH < 24 HOURS Performed at [facility], [address] [place], [place] [address]    Report Status PENDING  Incomplete  Urine culture     Status: None (Preliminary result)   Collection Time: 06/08/20  9:19 AM   Specimen: Urine, Random  Result Value Ref Range Status   Specimen Description   Final    URINE, RANDOM Performed at [facility], [address], [place], [place] [address]    Special Requests   Final    NONE Performed at [facility], [address], [place], [place] [address]    Culture   Final    CULTURE REINCUBATED FOR BETTER GROWTH Performed at [facility], [address] [place], [place] [address]    Report Status PENDING  Incomplete  Resp Panel by RT-PCR (Flu A&B, Covid) Nasopharyngeal Swab     Status: None   Collection Time: 06/08/20  9:22 AM   Specimen: Nasopharyngeal Swab; Nasopharyngeal(NP) swabs in vial transport medium  Result Value Ref Range Status   SARS Coronavirus 2 by RT PCR NEGATIVE NEGATIVE Final    Comment: (NOTE) SARS-CoV-2 target nucleic acids are NOT DETECTED.  The SARS-CoV-2 RNA is generally detectable in upper respiratory specimens during the acute phase of infection. The lowest concentration of SARS-CoV-2 viral copies this assay can detect is 138 copies/mL. A negative result does not preclude SARS-Cov-2 infection and should not be used as the sole basis for treatment or other patient management decisions. A negative result may occur with   improper specimen collection/handling, submission of specimen other than nasopharyngeal swab, presence of viral mutation(s) within the areas targeted by this assay, and inadequate number of viral copies(<138 copies/mL). A negative result must be combined with clinical observations, patient history, and epidemiological information. The expected result is Negative.  Fact Sheet for Patients:  BloggerCourse.com  Fact Sheet for Healthcare Providers:  SeriousBroker.it  This test is no t yet approved or cleared by the [place] FDA and  has been authorized for detection and/or diagnosis of SARS-CoV-2 by FDA under an Emergency Use Authorization (EUA). This EUA will remain  in  effect (meaning this test can be used) for the duration of the COVID-19 declaration under Section 564(b)(1) of the Act, 21 U.S.C.section 360bbb-3(b)(1), unless the authorization is terminated  or revoked sooner.       Influenza A by PCR NEGATIVE NEGATIVE Final   Influenza B by PCR NEGATIVE NEGATIVE Final    Comment: (NOTE) The Xpert Xpress SARS-CoV-2/FLU/RSV plus assay is intended as an aid in the diagnosis of influenza from Nasopharyngeal swab specimens and should not be used as a sole basis for treatment. Nasal washings and aspirates are unacceptable for Xpert Xpress SARS-CoV-2/FLU/RSV testing.  Fact Sheet for Patients: BloggerCourse.com  Fact Sheet for Healthcare Providers: SeriousBroker.it  This test is not yet approved or cleared by the [place] FDA and has been authorized for detection and/or diagnosis of SARS-CoV-2 by FDA under an Emergency Use Authorization (EUA). This EUA will remain in effect (meaning this test can be used) for the duration of the COVID-19 declaration under Section 564(b)(1) of the Act, 21 U.S.C. section 360bbb-3(b)(1), unless the authorization is terminated  or revoked.  Performed at [facility], [address] [place], [place] [address]   Culture, blood (single)     Status: None (Preliminary result)   Collection Time: 06/08/20  1:51 PM   Specimen: BLOOD  Result Value Ref Range Status   Specimen Description BLOOD LEFT ARM  Final   Special Requests   Final    BOTTLES DRAWN AEROBIC AND ANAEROBIC Blood Culture results may not be optimal due to an excessive volume of blood received in culture bottles   Culture   Final    NO GROWTH < 24 HOURS Performed at [facility], [address], [place], [place] [address]    Report Status PENDING  Incomplete  MRSA PCR Screening     Status: Abnormal   Collection Time: 06/08/20  8:42 PM   Specimen: Nasopharyngeal  Result Value Ref Range Status   MRSA by PCR POSITIVE (A) NEGATIVE Final    Comment:        The GeneXpert MRSA Assay (FDA approved for NASAL specimens only), is one component of a comprehensive MRSA colonization surveillance program. It is not intended to diagnose MRSA infection nor to guide or monitor treatment for MRSA infections. RESULT CALLED TO, READ BACK BY AND VERIFIED WITH: NOTIFIED Reynolds Bowl RN 06/08/2020 2311 JG Performed at [facility], [address] [place], [place] [address]          Radiology Studies: CT CHEST ABDOMEN PELVIS W CONTRAST  Result Date: 06/08/2020 CLINICAL DATA:  Fall this morning with abdominal pain. EXAM: CT CHEST, ABDOMEN, AND PELVIS WITH CONTRAST TECHNIQUE: Multidetector CT imaging of the chest, abdomen and pelvis was performed following the standard protocol during bolus administration of intravenous contrast. CONTRAST:  OMNIPAQUE IOHEXOL 300 MG/ML  SOLN COMPARISON:  Same day chest radiograph, chest CT dated 04/06/2020, abdominal ultrasound dated 03/10/2019. FINDINGS: CT CHEST FINDINGS Cardiovascular: Vascular calcifications are seen in the coronary arteries and aortic arch. The heart is mildly enlarged. No  pericardial effusion. Mediastinum/Nodes: No enlarged mediastinal, hilar, or axillary lymph nodes. Thyroid gland, trachea, and esophagus demonstrate no significant findings. Lungs/Pleura: There is a mild left lower lobe atelectasis. The right lung is clear. There is no pleural effusion or pneumothorax. Musculoskeletal: There is a subacute to chronic fracture of the anterior right sixth rib. This appears new since 04/06/2020. Degenerative changes are seen in the spine, most significant at T8-9. CT ABDOMEN PELVIS FINDINGS Hepatobiliary: The liver  is hypoattenuating, consistent with hepatic steatosis. No focal liver abnormality is seen. No gallstones, gallbladder wall thickening, or biliary dilatation. Pancreas: Unremarkable. No pancreatic ductal dilatation or surrounding inflammatory changes. Spleen: Normal in size without focal abnormality. Adrenals/Urinary Tract: Adrenal glands are unremarkable. Kidneys are normal, without renal calculi, focal lesion, or hydronephrosis. Bladder is unremarkable. Stomach/Bowel: Stomach is within normal limits. Appendix appears normal. There is colonic diverticulosis without evidence of diverticulitis. No evidence of bowel wall thickening, distention, or inflammatory changes. Vascular/Lymphatic: Aortic atherosclerosis. No enlarged abdominal or pelvic lymph nodes. Reproductive: Prostate is unremarkable. Other: No abdominal wall hernia or abnormality. No abdominopelvic ascites. Musculoskeletal: Degenerative changes are seen in the spine, most significant at L1-2 and L3-4. Severe degenerative changes are seen in the right hip. IMPRESSION: 1. Subacute to chronic fracture of the anterior right sixth rib. 2. Hepatic steatosis. Aortic Atherosclerosis (ICD10-I70.0). Electronically Signed   By: Romona Curls M.D.   On: 06/08/2020 11:55   DG Chest Port 1 View  Result Date: 06/08/2020 CLINICAL DATA:  Questionable sepsis EXAM: PORTABLE CHEST 1 VIEW COMPARISON:  05/04/2020 FINDINGS: Cardiomegaly  and vascular congestion, chronic. No Kerley lines. No air bronchogram. Hazy right apical pleural thickening which is stable. IMPRESSION: Cardiomegaly and vascular congestion. Electronically Signed   By: Marnee Spring M.D.   On: 06/08/2020 09:54        Scheduled Meds: . apixaban  5 mg Oral BID  . aspirin  81 mg Oral Daily  . atorvastatin  40 mg Oral Daily  . Chlorhexidine Gluconate Cloth  6 each Topical Daily  . DULoxetine  20 mg Oral Daily  . folic acid  1 mg Oral Daily  . furosemide  40 mg Intravenous Q12H  . gabapentin  300 mg Oral TID  . levothyroxine  75 mcg Oral QAC breakfast  . lisinopril  2.5 mg Oral Daily  . LORazepam  0-4 mg Intravenous Q6H   Or  . LORazepam  0-4 mg Oral Q6H  . [START ON 06/10/2020] LORazepam  0-4 mg Intravenous Q12H   Or  . [START ON 06/10/2020] LORazepam  0-4 mg Oral Q12H  . LORazepam  0-4 mg Oral Q4H   Followed by  . [START ON 06/11/2020] LORazepam  0-4 mg Oral Q8H  . methocarbamol  500 mg Oral TID  . metoprolol tartrate  50 mg Oral BID  . montelukast  10 mg Oral QHS  . multivitamin with minerals  1 tablet Oral Daily  . mupirocin ointment  1 application Nasal BID  . thiamine  100 mg Oral Daily   Or  . thiamine  100 mg Intravenous Daily   Continuous Infusions: . diltiazem (CARDIZEM) infusion 7.5 mg/hr (06/09/20 1400)     LOS: 1 day    Time spent: 35 minutes    Tresa Moore, MD [facility] Pager [phone]  If 7PM-7AM, please contact night-coverage 06/09/2020, 2:59 PM

## 2020-06-09 NOTE — Progress Notes (Signed)
PT Cancellation Note  Patient Details Name: Sajan Cheatwood MRN: [id number] DOB: 14-Oct-1960   Cancelled Treatment:    Reason Eval/Treat Not Completed: Patient not medically ready PT orders received, chart reviewed. Spoke with nurse who reports pt is on continuous bipap at this time. Will hold PT intervention as pt note medically appropriate for participation. Will f/u as able.   Aleda Grana, PT, DPT 06/09/20, 11:16 AM    Sandi Mariscal 06/09/2020, 11:15 AM

## 2020-06-09 NOTE — Consult Note (Signed)
WOC Nurse Consult Note: Reason for Consult: Patient known to our team from previous admissions.  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 ITD (intertriginous dermatitis) at the sub pannicular and bilateral inguinal skin folds  ICD-10 CM Codes for Irritant Dermatitis L24A9 - Due to friction or contact with other specified body fluids L30.4  - Erythema intertrigo. Also used for abrasion of the hand, chafing of the skin, dermatitis due to sweating and friction, friction dermatitis, friction eczema, and genital/thigh intertrigo.  Wound type: Lymphedema, Chronic friction Skin Injury plus moisture plus pressure Pressure Injury POA: Yes Measurement: LEs without wounds.  Bilateral ischial tuberosity skin injuries measure 13cm x 8cm x 0.2cm Wound bed:red, moist, scant bleeding Drainage (amount, consistency, odor) large amount serous Periwound: indurated, no warmth or erythema Dressing procedure/placement/frequency: A bariatric mattress replacement with low air loss feature is ordered.  Unna's Boot placed topped with 6-inch conform and Coban self adherent wraps. These will be changed once weekly. InterDry house antimicrobial wicking fabric is ordered for skin fold moisture management. Bilateral ischial tuberosity wounds are cleansed, dressed with xeroform and topped with silicone foams.  WOC nursing team will follow, and will remain available to this patient, the nursing and medical teams.   Thanks, Ladona Mow, MSN, RN, GNP, [place]  Pager# [phone]

## 2020-06-10 DIAGNOSIS — Z515 Encounter for palliative care: Secondary | ICD-10-CM | POA: Diagnosis not present

## 2020-06-10 DIAGNOSIS — F10230 Alcohol dependence with withdrawal, uncomplicated: Secondary | ICD-10-CM | POA: Diagnosis not present

## 2020-06-10 DIAGNOSIS — I50812 Chronic right heart failure: Secondary | ICD-10-CM | POA: Diagnosis not present

## 2020-06-10 DIAGNOSIS — I4891 Unspecified atrial fibrillation: Secondary | ICD-10-CM | POA: Diagnosis not present

## 2020-06-10 DIAGNOSIS — Z7189 Other specified counseling: Secondary | ICD-10-CM | POA: Diagnosis not present

## 2020-06-10 DIAGNOSIS — F1010 Alcohol abuse, uncomplicated: Secondary | ICD-10-CM | POA: Diagnosis not present

## 2020-06-10 LAB — BASIC METABOLIC PANEL
Anion gap: 10 (ref 5–15)
BUN: 12 mg/dL (ref 6–20)
CO2: 37 mmol/L — ABNORMAL HIGH (ref 22–32)
Calcium: 8.3 mg/dL — ABNORMAL LOW (ref 8.9–10.3)
Chloride: 88 mmol/L — ABNORMAL LOW (ref 98–111)
Creatinine, Ser: 0.56 mg/dL — ABNORMAL LOW (ref 0.61–1.24)
GFR, Estimated: >60 mL/min (ref >60)
Glucose, Bld: 142 mg/dL — ABNORMAL HIGH (ref 70–99)
Potassium: 3.3 mmol/L — ABNORMAL LOW (ref 3.5–5.1)
Sodium: 135 mmol/L (ref 135–145)

## 2020-06-10 LAB — PHOSPHORUS: Phosphorus: 1.8 mg/dL — ABNORMAL LOW (ref 2.5–4.6)

## 2020-06-10 LAB — MAGNESIUM: Magnesium: 1.5 mg/dL — ABNORMAL LOW (ref 1.7–2.4)

## 2020-06-10 MED ORDER — POTASSIUM CHLORIDE CRYS ER 20 MEQ PO TBCR
40.0000 meq | EXTENDED_RELEASE_TABLET | ORAL | Status: DC
  Administered 2020-06-10: 40 meq via ORAL
  Filled 2020-06-10: qty 2

## 2020-06-10 MED ORDER — MAGNESIUM SULFATE 2 GM/50ML IV SOLN
2.0000 g | Freq: Once | INTRAVENOUS | Status: AC
  Administered 2020-06-10: 2 g via INTRAVENOUS
  Filled 2020-06-10: qty 50

## 2020-06-10 MED ORDER — METHOCARBAMOL 500 MG PO TABS
750.0000 mg | ORAL_TABLET | Freq: Three times a day (TID) | ORAL | Status: DC
  Administered 2020-06-10 – 2020-06-29 (×55): 750 mg via ORAL
  Filled 2020-06-10: qty 1
  Filled 2020-06-10: qty 2
  Filled 2020-06-10 (×5): qty 1
  Filled 2020-06-10: qty 2
  Filled 2020-06-10: qty 1
  Filled 2020-06-10: qty 2
  Filled 2020-06-10 (×12): qty 1
  Filled 2020-06-10: qty 2
  Filled 2020-06-10 (×2): qty 1
  Filled 2020-06-10: qty 2
  Filled 2020-06-10 (×3): qty 1
  Filled 2020-06-10: qty 2
  Filled 2020-06-10 (×5): qty 1
  Filled 2020-06-10: qty 2
  Filled 2020-06-10 (×10): qty 1
  Filled 2020-06-10 (×2): qty 2
  Filled 2020-06-10: qty 1
  Filled 2020-06-10 (×2): qty 2
  Filled 2020-06-10: qty 1
  Filled 2020-06-10: qty 2
  Filled 2020-06-10 (×2): qty 1
  Filled 2020-06-10: qty 2
  Filled 2020-06-10 (×2): qty 1
  Filled 2020-06-10 (×2): qty 2

## 2020-06-10 MED ORDER — POTASSIUM CHLORIDE 20 MEQ PO PACK
40.0000 meq | PACK | ORAL | Status: AC
  Administered 2020-06-10 (×2): 40 meq via ORAL
  Filled 2020-06-10 (×2): qty 2

## 2020-06-10 MED ORDER — K PHOS MONO-SOD PHOS DI & MONO 155-852-130 MG PO TABS
500.0000 mg | ORAL_TABLET | ORAL | Status: AC
  Administered 2020-06-10 (×4): 500 mg via ORAL
  Filled 2020-06-10 (×4): qty 2

## 2020-06-10 MED ORDER — HYDROMORPHONE HCL 1 MG/ML IJ SOLN
0.5000 mg | INTRAMUSCULAR | Status: DC | PRN
  Administered 2020-06-14 – 2020-06-21 (×9): 0.5 mg via INTRAVENOUS
  Filled 2020-06-10 (×11): qty 1

## 2020-06-10 MED ORDER — OXYCODONE-ACETAMINOPHEN 7.5-325 MG PO TABS
1.0000 | ORAL_TABLET | ORAL | Status: DC | PRN
  Administered 2020-06-10 – 2020-06-11 (×2): 1 via ORAL
  Filled 2020-06-10 (×2): qty 1

## 2020-06-10 MED ORDER — ENSURE MAX PROTEIN PO LIQD
11.0000 [oz_av] | Freq: Three times a day (TID) | ORAL | Status: DC
  Administered 2020-06-10 – 2020-06-28 (×37): 11 [oz_av] via ORAL
  Filled 2020-06-10: qty 330

## 2020-06-10 NOTE — Progress Notes (Signed)
Daily Progress Note   Patient Name: [name] [name]       Date: 06/10/2020 DOB: [birth year]  Age: 60 y.o. MRN#: [id number] Attending Physician: Tresa Moore, MD Primary Care Physician: [name], DO Admit Date: 06/08/2020  Reason for Consultation/Follow-up: Establishing goals of care  Subjective: Patient is resting in bed on O2 by . He awakens to speak with me. He states he has pain and is suffering; and states his pain and suffering are getting worse. Discussed continued aggressive care and also a comfort focused path. He states his birthday is in [birth year], and he wants to try to live through his birthday. He states regardless of his pain or suffering he wants to continue to try to treat the treatable until this time. He confirms DNR/DNI status. Would recommend palliative at D/C.    Length of Stay: 2  Current Medications: Scheduled Meds:  . apixaban  5 mg Oral BID  . aspirin  81 mg Oral Daily  . atorvastatin  40 mg Oral Daily  . Chlorhexidine Gluconate Cloth  6 each Topical Daily  . DULoxetine  20 mg Oral Daily  . folic acid  1 mg Oral Daily  . furosemide  40 mg Intravenous Q12H  . gabapentin  300 mg Oral TID  . levothyroxine  75 mcg Oral QAC breakfast  . lisinopril  2.5 mg Oral Daily  . LORazepam  0-4 mg Intravenous Q6H   Or  . LORazepam  0-4 mg Oral Q6H  . LORazepam  0-4 mg Intravenous Q12H   Or  . LORazepam  0-4 mg Oral Q12H  . LORazepam  0-4 mg Oral Q4H   Followed by  . [START ON 06/11/2020] LORazepam  0-4 mg Oral Q8H  . methocarbamol  750 mg Oral TID  . metoprolol tartrate  50 mg Oral BID  . montelukast  10 mg Oral QHS  . multivitamin with minerals  1 tablet Oral Daily  . mupirocin ointment  1 application Nasal BID  . phosphorus  500 mg Oral Q4H  .  potassium chloride  40 mEq Oral Q4H  . Ensure Max Protein  11 oz Oral TID WC  . thiamine  100 mg Oral Daily   Or  . thiamine  100 mg Intravenous Daily    Continuous Infusions: . diltiazem (CARDIZEM) infusion 5  mg/hr (06/10/20 1125)    PRN Meds: acetaminophen **OR** acetaminophen, albuterol, alum & mag hydroxide-simeth, HYDROmorphone (DILAUDID) injection, LORazepam **OR** LORazepam, meclizine, metoprolol tartrate, midodrine, nitroGLYCERIN, ondansetron **OR** ondansetron (ZOFRAN) IV, oxyCODONE-acetaminophen  Physical Exam Pulmonary:     Effort: Pulmonary effort is normal.  Neurological:     Mental Status: He is alert.             Vital Signs: BP 116/82   Pulse (!) 55   Temp 98.1 F (36.7 C) (Oral)   Resp 16   Ht 5\' 8"  (1.727 m)   Wt (!) 178.9 kg   SpO2 92%   BMI 59.97 kg/m  SpO2: SpO2: 92 % O2 Device: O2 Device: Bi-PAP O2 Flow Rate: O2 Flow Rate (L/min): 4 L/min  Intake/output summary:   Intake/Output Summary (Last 24 hours) at 06/10/2020 1321 Last data filed at 06/10/2020 1125 Gross per 24 hour  Intake 263.35 ml  Output 2100 ml  Net -1836.65 ml   LBM: Last BM Date: 06/09/20 Baseline Weight: Weight: (!) 186 kg Most recent weight: Weight: (!) 178.9 kg           Patient Active Problem List   Diagnosis Date Noted  . Dyspnea and respiratory abnormality 05/04/2020  . Clostridium difficile diarrhea 05/04/2020  . Elevated lactic acid level 05/04/2020  . Unable to care for self 05/04/2020  . Weakness 05/02/2020  . UTI (urinary tract infection) 04/30/2020  . Chronic systolic CHF (congestive heart failure) (HCC) 04/30/2020  . Hypokalemia 04/30/2020  . Chronic respiratory failure (HCC) 04/30/2020  . Physical deconditioning 04/30/2020  . Respiratory arrest (HCC) 04/06/2020  . Weakness of both lower extremities 03/26/2020  . CHF (congestive heart failure) (HCC) 01/27/2020  . Shortness of breath 12/22/2019  . Atrial fibrillation, chronic (HCC) 12/22/2019  . Class  3 obesity with alveolar hypoventilation, serious comorbidity, and body mass index (BMI) of 60.0 to 69.9 in adult (HCC) 12/22/2019  . GAD (generalized anxiety disorder) 10/12/2019  . Second degree burn of left forearm 09/26/2019  . Alcohol withdrawal syndrome without complication (HCC)   . Lower extremity ulceration, unspecified laterality, with fat layer exposed (HCC) 08/09/2019  . Alcohol abuse   . Pressure ulcer 06/26/2019  . Iron deficiency anemia 06/22/2019  . Depression 06/22/2019  . Elevated troponin 06/22/2019  . Left-sided Bell's palsy 05/08/2019  . Chronic intractable headache 05/03/2019  . Noncompliance by refusing service 05/03/2019  . Facial droop 05/02/2019  . Pharmacologic therapy 04/11/2019  . Disorder of skeletal system 04/11/2019  . Problems influencing health status 04/11/2019  . Chronic anticoagulation (warfarin  COUMADIN) 04/11/2019  . Elevated sed rate 04/11/2019  . Elevated C-reactive protein (CRP) 04/11/2019  . Elevated hemoglobin A1c 04/11/2019  . Hypoalbuminemia 04/11/2019  . Edema due to hypoalbuminemia 04/11/2019  . Elevated brain natriuretic peptide (BNP) level 04/11/2019  . Chronic hip pain (Right) 04/11/2019  . Osteoarthritis of hip (Right) 04/11/2019  . Chronic atrial fibrillation (HCC) 03/08/2019  . Atrial fibrillation with RVR (HCC) 03/08/2019  . Degenerative joint disease of right hip 03/05/2019  . Hypothyroidism 03/04/2019  . Chronic venous stasis dermatitis of both lower extremities 03/04/2019  . Long term (current) use of anticoagulants 02/23/2019  . PE (pulmonary thromboembolism) (HCC) 01/21/2019  . HLD (hyperlipidemia) 01/21/2019  . CAD (coronary artery disease) 01/21/2019  . Noncompliance 01/09/2019  . Acquired thrombophilia (HCC) 11/28/2018  . Major depressive disorder, recurrent episode, in partial remission (HCC) 09/17/2018  . Chest pain 08/06/2018  . Personal history of DVT (deep vein thrombosis) 07/13/2018  .  History of pulmonary  embolism (on Coumadin) 07/13/2018  . Neck pain 07/13/2018  . Chronic low back pain (Bilateral)  w/ sciatica (Bilateral) 07/13/2018  . TBI (traumatic brain injury) (HCC) 07/04/2018  . Abnormal thyroid blood test 06/27/2018  . Type 2 diabetes mellitus with other specified complication (HCC) 06/06/2018  . HTN (hypertension) 06/06/2018  . Chronic gout involving toe, unspecified cause, unspecified laterality 06/06/2018  . Morbid obesity with BMI of 60.0-69.9, adult (HCC) 06/06/2018  . Chronic pain syndrome 06/06/2018  . Ulcers of both lower extremities, limited to breakdown of skin (HCC) 06/06/2018  . Gout 06/06/2018  . Diet-controlled diabetes mellitus (HCC) 06/06/2018  . Sepsis (HCC) 03/17/2018  . Acute on chronic diastolic CHF (congestive heart failure) (HCC) 02/02/2018  . Centrilobular emphysema (HCC) 02/02/2018  . Obstructive sleep apnea 02/02/2018  . Lymphedema 02/02/2018  . COPD (chronic obstructive pulmonary disease) (HCC) 02/02/2018  . Acute on chronic respiratory failure with hypoxia and hypercapnia (HCC) 01/27/2018  . Acute respiratory failure (HCC) 01/16/2018    Palliative Care Assessment & Plan   Recommendations/Plan: Treat the treatable. Patient wants to try to live long enough to have his birthday in [birth year].    Code Status:    Code Status Orders  (From admission, onward)         Start     Ordered   06/08/20 1433  Do not attempt resuscitation (DNR)  Continuous       Question Answer Comment  In the event of cardiac or respiratory ARREST Do not call a "code blue"   In the event of cardiac or respiratory ARREST Do not perform Intubation, CPR, defibrillation or ACLS   In the event of cardiac or respiratory ARREST Use medication by any route, position, wound care, and other measures to relive pain and suffering. May use oxygen, suction and manual treatment of airway obstruction as needed for comfort.      06/08/20 1434        Code Status History    Date Active Date  Inactive Code Status Order ID Comments User Context   05/04/2020 0547 05/16/2020 2156 Full Code [id number]  Andris Baumann, MD ED   04/30/2020 1120 05/02/2020 1830 Full Code [id number]  Lucile Shutters, MD ED   01/27/2020 0439 02/02/2020 2042 Full Code [id number]  Andris Baumann, MD ED   01/07/2020 1855 01/12/2020 1948 Full Code [id number]  Cox, Amy N, DO ED   12/22/2019 1631 12/28/2019 2132 Partial Code [id number]  Cox, Amy N, DO ED   11/07/2019 1928 11/10/2019 1942 Full Code [id number]  Lorretta Harp, MD ED   08/31/2019 1614 09/02/2019 0231 Full Code [id number]  Shaune Pollack, MD ED   08/09/2019 0621 08/14/2019 2012 Full Code [id number]  Andris Baumann, MD ED   06/22/2019 1531 06/26/2019 1742 Full Code [id number]  Lorretta Harp, MD Inpatient   03/04/2019 2314 03/05/2019 2105 Full Code [id number]  Anselm Jungling, DO ED   10/24/2018 0420 10/28/2018 1849 Full Code [id number]  Oralia Manis, MD Inpatient   08/06/2018 1815 08/10/2018 2042 Full Code [id number]  Alford Highland, MD ED   07/18/2018 0716 07/23/2018 1827 Full Code [id number]  Arnaldo Natal, MD ED   05/29/2018 1453 06/01/2018 1931 Full Code [id number]  Bertrum Sol, MD Inpatient   03/17/2018 0042 03/25/2018 1554 Full Code [id number]  Altamese Dilling, MD ED   02/24/2018 0725 02/27/2018 1555 Full Code [id number]  Oralia Manis, MD Inpatient   01/27/2018 1345 01/29/2018 1406 Full Code [id number]  Alford Highland, MD  ED   01/17/2018 1330 01/19/2018 1818 Partial Code [id number]  Milagros Loll, MD Inpatient   01/16/2018 1427 01/17/2018 1330 Full Code [id number]  Auburn Bilberry, MD ED   Advance Care Planning Activity      Prognosis:  Poor   Care plan was discussed with RN  Thank you for allowing the Palliative Medicine Team to assist in the care of this patient.   Total Time 15 min Prolonged Time Billed  no      Greater than 50%  of this time was spent counseling and coordinating care related to the above assessment and plan.  Morton Stall, NP  Please contact  Palliative Medicine Team phone at [phone] for questions and concerns.

## 2020-06-10 NOTE — Progress Notes (Signed)
Initial Nutrition Assessment  DOCUMENTATION CODES:   Morbid obesity  INTERVENTION:   -Ensure Max po TID, each supplement provides 150 kcal and [name] grams of protein -MVI with minerals daily  NUTRITION DIAGNOSIS:   Increased nutrient needs related to wound healing as evidenced by estimated needs.  GOAL:   Patient will meet greater than or equal to 90% of their needs  MONITOR:   PO intake,Supplement acceptance,Labs,Weight trends,Skin,I & O's  REASON FOR ASSESSMENT:   Low Braden    ASSESSMENT:   60 y.o. 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  Pt admitted with SIRS.   Reviewed I/O's: -2 L x 24 hours and +1.7 L since admissiopn  UOP: 2.6 L x 24 hours  Per [facility] notes, pt with bilateral ischial tuberosity chronic friction skin injuries (full and partial thickness) and ITD (intertriginous dermatitis) at the sub pannicular and bilateral inguinal skin folds.   Pt on Bi-pap and very sleepy at time of visit. He did not arouse to voice or touch. Observed breakfast tray; only a few bites eaten.   Case discussed in ICU rounds and with RN and MD. Pt may transition to comfort care soon.   Reviewed wt hx; pt has experienced a 3.8% wt loss over the past 3 months, which is not significant for time frame.   Medications reviewed and include lasix, folic acid, ativan, cardizem, and thiamine.   Lab Results  Component Value Date    HGBA1C 6.6 (H) 04/15/2020   PTA DM medications are none.   Labs reviewed: K: 3.3 (on PO supplementation), Phos: 1.8 (on PO supplementation), Mg: 1.5, CBGS: 154 (inpatient orders for glycemic control are none).   NUTRITION - FOCUSED PHYSICAL EXAM:  Flowsheet Row Most Recent Value  Orbital Region No depletion  Upper Arm Region No depletion  Thoracic and Lumbar Region No depletion  Buccal Region No depletion  Temple Region No depletion  Clavicle Bone Region No depletion  Clavicle and Acromion Bone Region No depletion  Scapular Bone Region No depletion  Dorsal Hand No depletion  Patellar Region No depletion  Anterior Thigh Region No depletion  Posterior Calf Region No depletion  Edema (RD Assessment) Moderate  Hair Reviewed  Eyes Reviewed  Mouth Reviewed  Skin Reviewed  Nails Reviewed       Diet Order:   Diet Order            Diet heart healthy/carb modified Room service appropriate? Yes; Fluid consistency: Thin  Diet effective now                 EDUCATION NEEDS:   Education needs have been addressed  Skin:  Skin Assessment: Skin Integrity Issues: Skin Integrity Issues:: Stage II,Other (Comment) Stage II: rt and lt thighs Other: ITD at sub pannicilar and bilateral inguina skin folds; full and partial thickness chronic friction skin injuries at bilateral ischial tuberosity  Last BM:  06/09/20  Height:   Ht Readings from Last 1 Encounters:  06/08/20 5\' 8"  (1.727 m)    Weight:   Wt Readings from Last 1 Encounters:  06/08/20 06/10/20)  178.9 kg    Ideal Body Weight:  70 kg  BMI:  Body mass index is 59.97 kg/m.  Estimated Nutritional Needs:   Kcal:  2100-2300  Protein:  150-175 grams  Fluid:  > 2.1 L    Levada Schilling, RD, LDN, CDCES Registered Dietitian II Certified Diabetes Care and Education Specialist Please refer to [facility] for RD and/or RD on-call/weekend/after hours pager

## 2020-06-10 NOTE — Plan of Care (Signed)
PMT note:  In to see [name] [name]. He is resting in bed on BIPAP. He opens eyes to voice but does not speak. Will reattempt conversation with him later today.   No charge.

## 2020-06-10 NOTE — Consult Note (Signed)
PHARMACY CONSULT NOTE  Pharmacy Consult for Electrolyte Monitoring and Replacement   Recent Labs: Potassium (mmol/L)  Date Value  06/10/2020 3.3 (L)   Magnesium (mg/dL)  Date Value  54/49/2010 1.5 (L)   Calcium (mg/dL)  Date Value  08/26/1973 8.3 (L)   Albumin (g/dL)  Date Value  88/32/5498 3.6  05/02/2019 3.8   Phosphorus (mg/dL)  Date Value  26/41/5830 1.8 (L)   Sodium (mmol/L)  Date Value  06/10/2020 135  06/06/2019 139    Assessment: Patient is a 60 y/o M with medical history including morbid obesity, CHF, chronic hypoxic respiratory failure, COPD, depression / anxiety, alcohol use disorder, atrial fibrillation, VTE who presented to the ED 4/24 after fall in setting of alcohol use. Pharmacy consulted to assist with electrolyte monitoring and replacement as indicated.  Diuretics: IV Lasix 40 mg BID  Goal of Therapy:  K > 4 Mg > 2 All other electrolytes within normal limits  Plan:  --K 3.3 - KCl 40 mEq PO x 2 --Mg 1.5 - will order 2g IV Mg sulfate  --Phos 1.8 - will order Kphos 2 tabs q4hrs x4  --Will follow-up electrolytes with AM labs tomorrow  Raiford Noble, PharmD Pharmacy Resident  06/10/2020 6:50 AM

## 2020-06-10 NOTE — Progress Notes (Signed)
OT Cancellation Note  Patient Details Name: Shawnn Bouillon MRN: [id number] DOB: [birth year]   Cancelled Treatment:    Reason Eval/Treat Not Completed: Medical issues which prohibited therapy. OT order received and chart reviewed. RN reports pt began to have withdrawal symptoms this morning and was given medication that has now made he very lethargic. He is currently sleeping soundly in the bed with Bipap donned. OT to re-attempt when pt is able to actively participate in OT intervention.   Jackquline Denmark, MS, OTR/L , CBIS ascom [phone]  06/10/20, 11:08 AM   06/10/2020, 11:07 AM

## 2020-06-10 NOTE — Evaluation (Signed)
Physical Therapy Evaluation Patient Details Name: [name] [name] MRN: [id number] DOB: 20-Feb-1960 Today's Date: 06/10/2020   History of Present Illness  Pt is a 60 y.o. male with medical history significant for morbid obesity, chronic hypoxic respiratory failure, obesity hypoventilation syndrome, hypertension, depression, anxiety, alcohol use disorder, Left ear deafness since the age of 43, COPD, history of nonischemic cardiomyopathy, heart failure reduced ejection fraction, persistent atrial fibrillation, sleep apnea, history of DVT, GERD, hypothyroid, hyperlipidemia, history of bilateral segmental and subsegmental pulmonary emboli, chronic bilateral lower extremity venous stasis, presents to the emergency department for chief concerns of having a fall.  MD assessment includes: SIRS, acute on chronic respiratory failure, A-fib with RVR, alcohol withdrawal, R 6th rib Fx, and FTT.    Clinical Impression  Pt somewhat lethargic during the session but able to participate and follow commands well.  Pt presented with poor functional strength and required +2 Mod assist with sup to/from sit for BLE and trunk control and was moderately SOB from the effort.  Pt was able to sit unsupported at the EOB with fair stability but after around 5 minutes began to vomit a moderate amount and after hygiene was assisted back to supine, nursing notified.  Pt would not be safe to return to his prior living situation at this time and will benefit from PT services in a SNF setting upon discharge to safely address deficits listed in patient problem list for decreased caregiver assistance and eventual return to PLOF.     Follow Up Recommendations SNF;Supervision/Assistance - 24 hour    Equipment Recommendations  Rolling walker with 5" wheels;Other (comment) (Bariatric RW)    Recommendations for Other Services       Precautions / Restrictions Precautions Precautions: Fall Restrictions Weight Bearing Restrictions: No       Mobility  Bed Mobility Overal bed mobility: Needs Assistance Bed Mobility: Supine to Sit;Sit to Supine     Supine to sit: Mod assist;+2 for physical assistance Sit to supine: +2 for physical assistance;Mod assist   General bed mobility comments: +2 mod A for BLE and trunk control    Transfers                 General transfer comment: NT secondary to pt vomiting and SOB in sitting  Ambulation/Gait                Stairs            Wheelchair Mobility    Modified Rankin (Stroke Patients Only)       Balance Overall balance assessment: Needs assistance Sitting-balance support: Bilateral upper extremity supported Sitting balance-Leahy Scale: Fair                                       Pertinent Vitals/Pain Pain Assessment: No/denies pain    Home Living Family/patient expects to be discharged to:: Private residence Living Arrangements: Non-relatives/Friends Available Help at Discharge: Personal care attendant;Available PRN/intermittently Type of Home: House Home Access: Ramped entrance     Home Layout: One level Home Equipment: Walker - 4 wheels;Shower seat;Hand held shower head;Wheelchair - power Additional Comments: Sleeps in regular bed, lift chair    Prior Function Level of Independence: Needs assistance   Gait / Transfers Assistance Needed: Bariatric rollator and scooter for functional mobility, more difficulty with amb recently, several recent hospital admissions  ADL's / Homemaking Assistance Needed: Pt reports having an aide that comes  3x week to assist with self care tasks such as shower as well as IADLs  Comments: Pt reports having transportation for appointments     Hand Dominance   Dominant Hand: Right    Extremity/Trunk Assessment   Upper Extremity Assessment Upper Extremity Assessment: Generalized weakness    Lower Extremity Assessment Lower Extremity Assessment: Generalized weakness        Communication   Communication: No difficulties  Cognition Arousal/Alertness: Lethargic Behavior During Therapy: WFL for tasks assessed/performed Overall Cognitive Status: Within Functional Limits for tasks assessed                                        General Comments      Exercises Total Joint Exercises Ankle Circles/Pumps: AROM;Strengthening;Both;10 reps Quad Sets: Strengthening;Both;10 reps Hip ABduction/ADduction: AAROM;Strengthening;Both;10 reps Straight Leg Raises: AAROM;Strengthening;Both;10 reps Long Arc Quad: AROM;Strengthening;Both;10 reps Other Exercises Other Exercises: Static sitting at EOB x 5 min for improved activity tolerance   Assessment/Plan    PT Assessment Patient needs continued PT services  PT Problem List Decreased strength;Decreased activity tolerance;Decreased balance;Decreased mobility;Decreased knowledge of use of DME       PT Treatment Interventions DME instruction;Gait training;Functional mobility training;Therapeutic activities;Therapeutic exercise;Balance training;Patient/family education    PT Goals (Current goals can be found in the Care Plan section)  Acute Rehab PT Goals Patient Stated Goal: To return home PT Goal Formulation: With patient Time For Goal Achievement: 06/23/20 Potential to Achieve Goals: Fair    Frequency Min 2X/week   Barriers to discharge Inaccessible home environment;Decreased caregiver support      Co-evaluation               AM-PAC PT "6 Clicks" Mobility  Outcome Measure Help needed turning from your back to your side while in a flat bed without using bedrails?: A Lot Help needed moving from lying on your back to sitting on the side of a flat bed without using bedrails?: A Lot Help needed moving to and from a bed to a chair (including a wheelchair)?: A Lot Help needed standing up from a chair using your arms (e.g., wheelchair or bedside chair)?: A Lot Help needed to walk in hospital  room?: Total Help needed climbing 3-5 steps with a railing? : Total 6 Click Score: 10    End of Session Equipment Utilized During Treatment: Oxygen Activity Tolerance: Other (comment) (limited by N&V) Patient left: in bed;with call bell/phone within reach;with nursing/sitter in room Nurse Communication: Mobility status;Other (comment) (Pt with N&V) PT Visit Diagnosis: Muscle weakness (generalized) (M62.81);Difficulty in walking, not elsewhere classified (R26.2)    Time: 1454-1530 PT Time Calculation (min) (ACUTE ONLY): 36 min   Charges:   PT Evaluation $PT Eval Moderate Complexity: 1 Mod PT Treatments $Therapeutic Exercise: 8-22 mins       D. Elly Modena PT, DPT 06/10/20, 3:57 PM

## 2020-06-10 NOTE — Progress Notes (Signed)
Pain medication and ativan administered PO this morning, pt then vomited. PRN ativan administered IV for CIWA score and pts severe headache. Pt became drowsy but responds to voice. RN held ativan for rest of this shift for drowsiness. VSS.

## 2020-06-10 NOTE — Consult Note (Signed)
Consultation Note Date: 06/10/2020   Patient Name: [name] [name]  DOB: 12/17/1960  MRN: [id number]  Age / Sex: 60 y.o., male  PCP: [name], DO Referring Physician: Tresa Moore, MD  Reason for Consultation: Establishing goals of care  HPI/Patient Profile: [name] [name] is a 60 y.o. 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 He declined going to SNF and proceeded home. He currently presents to the emergency department for chief concerns of having a fall in the a.m.  Clinical Assessment and Goals of Care: Previous notes reviewed. Patient is resting in bed. He states he lives at home with his roommate. He is not married and does not have children. His quality of life is not what he would want it to be, and he has SOB and lower extremity pain from edema that makes if difficult to move about.     We discussed his diagnosis and GOC. The difference between an aggressive medical intervention path and a comfort care path was discussed.  Values and goals of care important to patient and family were attempted to be elicited.  Discussed limitations of medical interventions to prolong quality of life in some situations and discussed the concept of human mortality.  Patient will  consider options in care. He is a candidate for hospice at home, and potentially hospice facility if he chooses.    He states his sister Raylan Hanton would be his surrogate decision maker.     SUMMARY OF RECOMMENDATIONS   Will follow.    Prognosis:   Poor overall.       Primary Diagnoses: Present on Admission: . Sepsis (HCC) . Alcohol abuse . Alcohol withdrawal syndrome without complication (HCC) . Atrial fibrillation with RVR (HCC) . Chronic atrial fibrillation (HCC) . Centrilobular emphysema (HCC) . Atrial fibrillation, chronic (HCC) . Chronic gout involving toe, unspecified cause, unspecified laterality . COPD (chronic obstructive pulmonary disease) (HCC) . Depression . Elevated lactic acid level . HLD (hyperlipidemia) . HTN (hypertension) . GAD (generalized anxiety disorder) . Major depressive disorder, recurrent episode, in partial remission (HCC) . Physical deconditioning . Pressure ulcer . TBI (traumatic brain injury) (HCC) . Unable to care for self . Weakness of both lower extremities   I have reviewed the medical record, interviewed the patient and family, and examined the patient. The following aspects are pertinent.  Past Medical History:  Diagnosis Date  . Allergy   . Anxiety   . Arthritis   . Asthma   . Brain damage   . Chronic pain of both knees 07/13/2018  . Clotting  disorder (HCC)   . COPD (chronic obstructive pulmonary disease) (HCC)   . Depression   . GERD (gastroesophageal reflux disease)   . HFrEF (heart failure with reduced ejection fraction) (HCC)    a. 03/2018 Echo: EF 25-30%, diff HK. Mod LAE.  [name] History of DVT (deep vein thrombosis)   . History of pulmonary embolism    a. Chronic coumadin.  [name] Hypertension   . MI (myocardial infarction) (HCC)   . Morbid obesity (HCC)   . Neck pain 07/13/2018  . NICM (nonischemic cardiomyopathy) (HCC)    a. s/p Cath x 3 - reportedly nl cors. Last cath 2019 in [place]; b. a. 03/2018 Echo: EF 25-30%, diff  HK.  [name] Persistent atrial fibrillation (HCC)    a. 03/2018 s/p DCCV; b. CHA2DS2VASc = 1-->Xarelto (later changed to warfarin); c. 05/2018 recurrent afib-->Amio initiated.  . Sleep apnea   . Sleep apnea    Social History   Socioeconomic History  . Marital status: Single    Spouse name: Not on file  . Number of children: Not on file  . Years of education: Not on file  . Highest education level: Not on file  Occupational History  . Not on file  Tobacco Use  . Smoking status: Never Smoker  . Smokeless tobacco: Never Used  Vaping Use  . Vaping Use: Never used  Substance and Sexual Activity  . Alcohol use: Not Currently  . Drug use: Never  . Sexual activity: Not Currently  Other Topics Concern  . Not on file  Social History Narrative  . Not on file   Social Determinants of Health   Financial Resource Strain: Not on file  Food Insecurity: Not on file  Transportation Needs: Unmet Transportation Needs  . Lack of Transportation (Medical): Yes  . Lack of Transportation (Non-Medical): Yes  Physical Activity: Not on file  Stress: Not on file  Social Connections: Not on file   Family History  Problem Relation Age of Onset  . Heart failure Mother   . Lung cancer Mother   . Lung cancer Father   . Heart attack Maternal Grandmother   . Heart attack Maternal Grandfather    Scheduled Meds: . apixaban  5 mg Oral BID  . aspirin  81 mg Oral Daily  . atorvastatin  40 mg Oral Daily  . Chlorhexidine Gluconate Cloth  6 each Topical Daily  . DULoxetine  20 mg Oral Daily  . folic acid  1 mg Oral Daily  . furosemide  40 mg Intravenous Q12H  . gabapentin  300 mg Oral TID  . levothyroxine  75 mcg Oral QAC breakfast  . lisinopril  2.5 mg Oral Daily  . LORazepam  0-4 mg Intravenous Q6H   Or  . LORazepam  0-4 mg Oral Q6H  . LORazepam  0-4 mg Intravenous Q12H   Or  . LORazepam  0-4 mg Oral Q12H  . LORazepam  0-4 mg Oral Q4H   Followed by  . [START ON 06/11/2020] LORazepam  0-4 mg Oral Q8H   . methocarbamol  500 mg Oral TID  . metoprolol tartrate  50 mg Oral BID  . montelukast  10 mg Oral QHS  . multivitamin with minerals  1 tablet Oral Daily  . mupirocin ointment  1 application Nasal BID  . phosphorus  500 mg Oral Q4H  . potassium chloride  40 mEq Oral Q4H  . thiamine  100 mg Oral Daily   Or  . thiamine  100 mg Intravenous  Daily   Continuous Infusions: . diltiazem (CARDIZEM) infusion 5 mg/hr (06/10/20 0800)  . magnesium sulfate bolus IVPB 50 mL/hr at 06/10/20 0800   PRN Meds:.acetaminophen **OR** acetaminophen, albuterol, alum & mag hydroxide-simeth, LORazepam **OR** LORazepam, meclizine, metoprolol tartrate, midodrine, nitroGLYCERIN, ondansetron **OR** ondansetron (ZOFRAN) IV, oxyCODONE-acetaminophen Medications Prior to Admission:  Prior to Admission medications   Medication Sig Start Date End Date Taking? Authorizing Provider  gabapentin (NEURONTIN) 300 MG capsule Take 300 mg by mouth 3 (three) times daily.   Yes [provider]  oxyCODONE-acetaminophen (PERCOCET) 7.5-325 MG tablet Take 1 tablet by mouth every 4 (four) hours as needed for severe pain.   Yes [provider]  warfarin (COUMADIN) 5 MG tablet Take 5 mg by mouth daily at 4 PM.   Yes [provider]  albuterol (VENTOLIN HFA) 108 (90 Base) MCG/ACT inhaler Inhale 2 puffs into the lungs every 4 (four) hours as needed for wheezing or shortness of breath. 04/28/20   Sreenath, Jonelle Sports, MD  apixaban (ELIQUIS) 5 MG TABS tablet Take 1 tablet (5 mg total) by mouth 2 (two) times daily. 04/28/20   Tresa Moore, MD  aspirin 81 MG chewable tablet Chew 1 tablet (81 mg total) by mouth daily. Patient not taking: No sig reported 02/03/20   Esaw Grandchild A, DO  atorvastatin (LIPITOR) 40 MG tablet TAKE 1 TABLET BY MOUTH ONCE DAILY Patient taking differently: Take 40 mg by mouth daily. 08/15/19   Flinchum, [name], FNP  DULoxetine (CYMBALTA) 20 MG capsule Take 1 capsule (20 mg total) by mouth  daily. 10/25/19   Karamalegos, Netta Neat, DO  levothyroxine (SYNTHROID) 75 MCG tablet Take 1 tablet (75 mcg total) by mouth daily before breakfast. 01/13/20   Enedina Finner, MD  lisinopril (ZESTRIL) 2.5 MG tablet Take by mouth. 03/06/19   [provider]  loperamide (IMODIUM) 2 MG capsule Take 1 capsule (2 mg total) by mouth as needed for diarrhea or loose stools. Patient not taking: No sig reported 06/26/19   Arnetha Courser, MD  magnesium oxide (MAG-OX) 400 (241.3 Mg) MG tablet Take 1 tablet (400 mg total) by mouth 2 (two) times daily. Patient not taking: No sig reported 01/12/20   Enedina Finner, MD  meclizine (ANTIVERT) 25 MG tablet Take 0.5-1 tablets (12.5-25 mg total) by mouth 3 (three) times daily as needed for dizziness. 02/26/20   Karamalegos, Netta Neat, DO  metoprolol succinate (TOPROL-XL) 100 MG 24 hr tablet Take 100 mg by mouth daily. 03/17/20   [provider]  montelukast (SINGULAIR) 10 MG tablet Take 1 tablet (10 mg total) by mouth at bedtime. 10/12/19   Karamalegos, Netta Neat, DO  Multiple Vitamin (MULTIVITAMIN WITH MINERALS) TABS tablet Take 1 tablet by mouth daily.    [provider]  naphazoline-glycerin (CLEAR EYES REDNESS) 0.012-0.2 % SOLN Place 1-2 drops into both eyes 4 (four) times daily as needed for eye irritation. 08/14/19   Alford Highland, MD  nitroGLYCERIN (NITROSTAT) 0.4 MG SL tablet Place 1 tablet under tongue every 5 minutes as needed for chest pain. (No more than 3 doses within 15 minutes) 03/08/19   Flinchum, [name], FNP  potassium chloride SA (KLOR-CON) 20 MEQ tablet Take 1 tablet (20 mEq total) by mouth daily. 05/02/20   Danford, Earl Lites, MD  torsemide (DEMADEX) 20 MG tablet Take 4 tablets (80 mg total) by mouth 2 (two) times daily. Patient not taking: No sig reported 02/02/20   Esaw Grandchild A, DO  traZODone (DESYREL) 150 MG tablet Take  1 tablet (150 mg total) by mouth at bedtime. 10/25/19   Karamalegos, Netta Neat, DO   No Known  Allergies Review of Systems  Respiratory: Positive for shortness of breath.   Cardiovascular: Positive for leg swelling.    Physical Exam Pulmonary:     Effort: Pulmonary effort is normal.  Neurological:     Mental Status: He is alert.     Vital Signs: BP 137/79   Pulse 96   Temp 98.8 F (37.1 C) (Oral)   Resp (!) 23   Ht 5\' 8"  (1.727 m)   Wt (!) 178.9 kg   SpO2 94%   BMI 59.97 kg/m  Pain Scale: 0-10 POSS *See Group Information*: 1-Acceptable,Awake and alert Pain Score: 10-Worst pain ever   SpO2: SpO2: 94 % O2 Device:SpO2: 94 % O2 Flow Rate: .O2 Flow Rate (L/min): 4 L/min  IO: Intake/output summary:   Intake/Output Summary (Last 24 hours) at 06/10/2020 06/12/2020 Last data filed at 06/10/2020 0800 Gross per 24 hour  Intake 206.35 ml  Output 2600 ml  Net -2393.65 ml    LBM: Last BM Date: 06/09/20 Baseline Weight: Weight: (!) 186 kg Most recent weight: Weight: (!) 178.9 kg        Time In: 4:00 Time Out: 4:30 Time Total: 30 min Greater than 50%  of this time was spent counseling and coordinating care related to the above assessment and plan.  Signed by: 06/11/20, NP   Please contact Palliative Medicine Team phone at [phone] for questions and concerns.  For individual provider: See [phone]

## 2020-06-10 NOTE — Progress Notes (Signed)
PROGRESS NOTE    [name] MajesticKenneth [name]  ZOX:[id number] DOB: 01/14/1961 DOA: 06/08/2020 PCP: Smitty CordsKaramalegos, Alexander J, DO   Brief Narrative:  60 y.o. 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  He denies head trauma and loss of consciousness.  He endorses abdominal pain.  He told EMS that he did not want to go to the hospital and that he just wants to be left alone to die.  At bedside, he denies fever, chest pain, changes to his short baseline shortness of breath.  She denies dysuria and hematuria.  He endorses nausea, and denies vomiting.  He endorses to me that he drank alcohol 4 to 5 hours prior to presentation to the emergency department.  Denies SI and HI.  Presentation consistent with acute on chronic respiratory failure likely secondary to COPD/CHF.  Remains stepdown status.  Was on BiPAP since admission, taken off on 4/25.  Tolerating nasal cannula okay but increased work of breathing.  4/26: Patient apparently began to exhibit some withdrawal symptoms today.  Seen by palliative care.  Patient's birthday is in [birth year] and he wishes to live long enough to see his birthday.  He confirms DNR/DNI status but would like to treat the treatable.  He endorses some shoulder pain as well   Assessment & Plan:   Active Problems:   Centrilobular emphysema (HCC)   Sepsis (HCC)   Chronic atrial fibrillation (HCC)   HTN (hypertension)   Chronic gout  involving toe, unspecified cause, unspecified laterality   TBI (traumatic brain injury) (HCC)   Personal history of DVT (deep vein thrombosis)   Atrial fibrillation with RVR (HCC)   Major depressive disorder, recurrent episode, in partial remission (HCC)   HLD (hyperlipidemia)   Depression   Pressure ulcer   Alcohol abuse   Alcohol withdrawal syndrome without complication (HCC)   GAD (generalized anxiety disorder)   COPD (chronic obstructive pulmonary disease) (HCC)   Atrial fibrillation, chronic (HCC)   Class 3 obesity with alveolar hypoventilation, serious comorbidity, and body mass index (BMI) of 60.0 to 69.9 in adult [facility](HCC)   CHF (congestive heart failure) (HCC)   Weakness of both lower extremities   Physical deconditioning   Elevated lactic acid level   Unable to care for self  SIRS criteria Sepsis ruled out Lactic acid downtrending Urinalysis not overtly concerning for infection Blood cultures no growth to date . Procalcitonin negative plan: No further antibiotics Follow vitals and fever curve  Acute on chronic respiratory failure Will likely need further BiPAP therapy Respiratory failure likely due to decompensated CHF/COPD Plan: BiPAP as needed Supplemental oxygen as needed Diuretics Nebulizer Pulmonary toileting  Atrial fibrillation with rapid ventricular response Multifactorial Alcohol withdrawal versus acute intoxication Remains dependent on diltiazem drip Plan: Continue stepdown status for now Continue dilt drip, wean as tolerated Resume metoprolol titrate 50 mg twice daily Convert to succinate when rate better controlled No IV fluids  Alcohol use, suspect intoxication Alcohol withdrawal CIWA protocol initiated Patient began to exhibit withdrawal symptoms on 4/26 Patient drinks half gallon of whiskey a day High risk for withdrawals Plan: Continue CIWA  protocol with as needed Ativan  Right sixth rib fracture Acute to subacute Symptomatic  management Patient seems to respond okay to muscle relaxants  Hypertension Lisinopril 2.5 mg daily Metoprolol tartrate 50 mg twice daily Wean Cardizem gtt. as allowed  Depression Duloxetine 20 mg daily  Hypothyroid Synthroid 75 micrograms daily  OSA/OHS Nightly BiPAP  Morbid obesity This complicates overall care and prognosis  Multiple medical comorbidities Failure to thrive Patient has number of medical comorbidities and frequent readmissions.  He has expressed desires to not pursue aggressive medical intervention.  Despite his young age I feel he may be a candidate for home hospice services to prevent future readmissions which would be against his wishes. Plan: Patient was seen by palliative care.  I discussed with palliative care NP Crystal.  At this point patient would like to treat the treatable despite the fact that he is in pain.  He is aware of the fact that he may be suffering and may not live to see his birthday in July of this year.  He confirmed DNR/DNI status but would like to treat the treatable.      DVT prophylaxis: Eliquis Code Status: DNR Family Communication: None today.  Offered to call the patient declined Disposition Plan: Status is: Inpatient  Remains inpatient appropriate because:Inpatient level of care appropriate due to severity of illness   Dispo: The patient is from: Home              Anticipated d/c is to: Undetermined at this time              Patient currently is not medically stable to d/c.   Difficult to place patient No  Patient with acute on chronic hypoxic and hypercarbic respiratory failure.  Multifactorial in origin.  Atrial fibrillation right ventricular response.  Now exhibiting signs of alcohol withdrawal.    . Level of care: Stepdown  Consultants:   None   Procedures: None  Antimicrobials:   None   Subjective: Patient seen and examined.  More awake this morning.  Still on 4 L.  Increased work of  breathing..  Objective: Vitals:   06/10/20 1000 06/10/20 1100 06/10/20 1200 06/10/20 1300  BP: 113/67 130/82 116/82 132/89  Pulse: 77 81 (!) 55 (!) 37  Resp: 20 15 16 18   Temp:   98.1 F (36.7 C)   TempSrc:   Oral   SpO2: 91% 92% 92% 96%  Weight:      Height:        Intake/Output Summary (Last 24 hours) at 06/10/2020 1412 Last data filed at 06/10/2020 1125 Gross per 24 hour  Intake 182.32 ml  Output 1200 ml  Net -1017.68 ml   Filed Weights   06/08/20 0913 06/08/20 2045  Weight: (!) 186 kg (!) 178.9 kg    Examination:  General exam: Appears work of breathing.  Anxious Respiratory system: Tachypneic, scattered crackles, increased work of breathing, 3 L Cardiovascular system: Tachycardic, irregular rhythm, no murmurs Gastrointestinal system: Obese, nontender, nondistended, normal bowel sounds Central nervous system: Alert and oriented, lethargic, no focal deficits  extremities: Diffusely decreased power bilaterally. Skin: Dry skin, scattered excoriations Psychiatry: Judgement and insight appear impaired. Mood & affect flattened.     Data Reviewed: I have personally reviewed following labs and imaging studies  CBC: Recent Labs  Lab 06/08/20 0919 06/09/20 0053  WBC 8.5 9.7  NEUTROABS 6.0  --   HGB 12.9* 12.3*  HCT 38.6* 37.7*  MCV 96.0 97.4  PLT 225 161  Basic Metabolic Panel: Recent Labs  Lab 06/08/20 0919 06/09/20 0053 06/10/20 0442  NA 134* 136 135  K 3.7 3.4* 3.3*  CL 84* 90* 88*  CO2 31 31 37*  GLUCOSE 147* 162* 142*  BUN 12 11 12   CREATININE 0.85 0.52* 0.56*  CALCIUM 8.2* 8.1* 8.3*  MG  --  1.3* 1.5*  PHOS  --  2.4* 1.8*   GFR: Estimated Creatinine Clearance: 158.3 mL/min (A) (by C-G formula based on SCr of 0.56 mg/dL (L)). Liver Function Tests: Recent Labs  Lab 06/08/20 0919  AST 25  ALT 12  ALKPHOS 82  BILITOT 0.7  PROT 7.4  ALBUMIN 3.6   No results for input(s): LIPASE, AMYLASE in the last 168 hours. No results for input(s):  AMMONIA in the last 168 hours. Coagulation Profile: Recent Labs  Lab 06/08/20 0919 06/09/20 0053  INR 1.0 1.1   Cardiac Enzymes: No results for input(s): CKTOTAL, CKMB, CKMBINDEX, TROPONINI in the last 168 hours. BNP (last 3 results) No results for input(s): PROBNP in the last 8760 hours. HbA1C: No results for input(s): HGBA1C in the last 72 hours. CBG: Recent Labs  Lab 06/08/20 2045  GLUCAP 154*   Lipid Profile: No results for input(s): CHOL, HDL, LDLCALC, TRIG, CHOLHDL, LDLDIRECT in the last 72 hours. Thyroid Function Tests: No results for input(s): TSH, T4TOTAL, FREET4, T3FREE, THYROIDAB in the last 72 hours. Anemia Panel: No results for input(s): VITAMINB12, FOLATE, FERRITIN, TIBC, IRON, RETICCTPCT in the last 72 hours. Sepsis Labs: Recent Labs  Lab 06/08/20 1538 06/08/20 2045 06/09/20 0053 06/09/20 0834 06/09/20 1131  PROCALCITON  --   --  <0.10  --   --   LATICACIDVEN 4.5* 3.9*  --  1.6 1.6    Recent Results (from the past 240 hour(s))  Blood culture (routine single)     Status: None (Preliminary result)   Collection Time: 06/08/20  9:19 AM   Specimen: BLOOD  Result Value Ref Range Status   Specimen Description BLOOD RIGHT [facility]  Final   Special Requests   Final    BOTTLES DRAWN AEROBIC AND ANAEROBIC Blood Culture results may not be optimal due to an inadequate volume of blood received in culture bottles   Culture   Final    NO GROWTH 2 DAYS Performed at [facility], [address], [place], [place]    Report Status PENDING  Incomplete  Urine culture     Status: None (Preliminary result)   Collection Time: 06/08/20  9:19 AM   Specimen: Urine, Random  Result Value Ref Range Status   Specimen Description   Final    URINE, RANDOM Performed at [facility], [address] [place], [place]    Special Requests   Final    NONE Performed at [facility], [address], [place], [place]    Culture    Final    CULTURE REINCUBATED FOR BETTER GROWTH 20,000 COLONIES/mL ENTEROCOCCUS FAECALIS SUSCEPTIBILITIES TO FOLLOW Performed at [facility], [address] [place], [place]    Report Status PENDING  Incomplete  Resp Panel by RT-PCR (Flu A&B, Covid) Nasopharyngeal Swab     Status: None   Collection Time: 06/08/20  9:22 AM   Specimen: Nasopharyngeal Swab; Nasopharyngeal(NP) swabs in vial transport medium  Result Value Ref Range Status   SARS Coronavirus 2 by RT PCR NEGATIVE NEGATIVE Final    Comment: (NOTE) SARS-CoV-2 target nucleic acids are NOT DETECTED.  The  SARS-CoV-2 RNA is generally detectable in upper respiratory specimens during the acute phase of infection. The lowest concentration of SARS-CoV-2 viral copies this assay can detect is 138 copies/mL. A negative result does not preclude SARS-Cov-2 infection and should not be used as the sole basis for treatment or other patient management decisions. A negative result may occur with  improper specimen collection/handling, submission of specimen other than nasopharyngeal swab, presence of viral mutation(s) within the areas targeted by this assay, and inadequate number of viral copies(<138 copies/mL). A negative result must be combined with clinical observations, patient history, and epidemiological information. The expected result is Negative.  Fact Sheet for Patients:  BloggerCourse.com  Fact Sheet for Healthcare Providers:  SeriousBroker.it  This test is no t yet approved or cleared by the [place] FDA and  has been authorized for detection and/or diagnosis of SARS-CoV-2 by FDA under an Emergency Use Authorization (EUA). This EUA will remain  in effect (meaning this test can be used) for the duration of the COVID-19 declaration under Section 564(b)(1) of the Act, 21 U.S.C.section 360bbb-3(b)(1), unless the authorization is terminated  or revoked  sooner.       Influenza A by PCR NEGATIVE NEGATIVE Final   Influenza B by PCR NEGATIVE NEGATIVE Final    Comment: (NOTE) The Xpert Xpress SARS-CoV-2/FLU/RSV plus assay is intended as an aid in the diagnosis of influenza from Nasopharyngeal swab specimens and should not be used as a sole basis for treatment. Nasal washings and aspirates are unacceptable for Xpert Xpress SARS-CoV-2/FLU/RSV testing.  Fact Sheet for Patients: BloggerCourse.com  Fact Sheet for Healthcare Providers: SeriousBroker.it  This test is not yet approved or cleared by the [place] FDA and has been authorized for detection and/or diagnosis of SARS-CoV-2 by FDA under an Emergency Use Authorization (EUA). This EUA will remain in effect (meaning this test can be used) for the duration of the COVID-19 declaration under Section 564(b)(1) of the Act, 21 U.S.C. section 360bbb-3(b)(1), unless the authorization is terminated or revoked.  Performed at [facility], [address] [place], [place] [address]   Culture, blood (single)     Status: None (Preliminary result)   Collection Time: 06/08/20  1:51 PM   Specimen: BLOOD  Result Value Ref Range Status   Specimen Description BLOOD LEFT ARM  Final   Special Requests   Final    BOTTLES DRAWN AEROBIC AND ANAEROBIC Blood Culture results may not be optimal due to an excessive volume of blood received in culture bottles   Culture   Final    NO GROWTH 2 DAYS Performed at [facility], [address] [place], [place] [address]    Report Status PENDING  Incomplete  MRSA PCR Screening     Status: Abnormal   Collection Time: 06/08/20  8:42 PM   Specimen: Nasopharyngeal  Result Value Ref Range Status   MRSA by PCR POSITIVE (A) NEGATIVE Final    Comment:        The GeneXpert MRSA Assay (FDA approved for NASAL specimens only), is one component of a comprehensive MRSA colonization surveillance  program. It is not intended to diagnose MRSA infection nor to guide or monitor treatment for MRSA infections. RESULT CALLED TO, READ BACK BY AND VERIFIED WITH: NOTIFIED Reynolds Bowl RN 06/08/2020 2311 JG Performed at [facility], [address], [place], [place] [address]          Radiology Studies: No results found.      Scheduled Meds: .  apixaban  5 mg Oral BID  . aspirin  81 mg Oral Daily  . atorvastatin  40 mg Oral Daily  . Chlorhexidine Gluconate Cloth  6 each Topical Daily  . DULoxetine  20 mg Oral Daily  . folic acid  1 mg Oral Daily  . furosemide  40 mg Intravenous Q12H  . gabapentin  300 mg Oral TID  . levothyroxine  75 mcg Oral QAC breakfast  . lisinopril  2.5 mg Oral Daily  . LORazepam  0-4 mg Intravenous Q6H   Or  . LORazepam  0-4 mg Oral Q6H  . LORazepam  0-4 mg Intravenous Q12H   Or  . LORazepam  0-4 mg Oral Q12H  . LORazepam  0-4 mg Oral Q4H   Followed by  . [START ON 06/11/2020] LORazepam  0-4 mg Oral Q8H  . methocarbamol  750 mg Oral TID  . metoprolol tartrate  50 mg Oral BID  . montelukast  10 mg Oral QHS  . multivitamin with minerals  1 tablet Oral Daily  . mupirocin ointment  1 application Nasal BID  . phosphorus  500 mg Oral Q4H  . potassium chloride  40 mEq Oral Q4H  . Ensure Max Protein  11 oz Oral TID WC  . thiamine  100 mg Oral Daily   Or  . thiamine  100 mg Intravenous Daily   Continuous Infusions: . diltiazem (CARDIZEM) infusion 5 mg/hr (06/10/20 1125)     LOS: 2 days    Time spent: 25 minutes    Tresa Moore, MD [facility] Pager [phone]  If 7PM-7AM, please contact night-coverage 06/10/2020, 2:12 PM

## 2020-06-11 ENCOUNTER — Ambulatory Visit: Payer: Medicaid Other | Admitting: Internal Medicine

## 2020-06-11 DIAGNOSIS — I4891 Unspecified atrial fibrillation: Secondary | ICD-10-CM | POA: Diagnosis not present

## 2020-06-11 LAB — BASIC METABOLIC PANEL
Anion gap: 13 (ref 5–15)
BUN: 15 mg/dL (ref 6–20)
CO2: 36 mmol/L — ABNORMAL HIGH (ref 22–32)
Calcium: 8.7 mg/dL — ABNORMAL LOW (ref 8.9–10.3)
Chloride: 86 mmol/L — ABNORMAL LOW (ref 98–111)
Creatinine, Ser: 0.72 mg/dL (ref 0.61–1.24)
GFR, Estimated: >60 mL/min (ref >60)
Glucose, Bld: 115 mg/dL — ABNORMAL HIGH (ref 70–99)
Potassium: 4.1 mmol/L (ref 3.5–5.1)
Sodium: 135 mmol/L (ref 135–145)

## 2020-06-11 LAB — URINE CULTURE: Culture: >=100000 — AB

## 2020-06-11 LAB — MAGNESIUM: Magnesium: 1.8 mg/dL (ref 1.7–2.4)

## 2020-06-11 LAB — PHOSPHORUS: Phosphorus: 2.9 mg/dL (ref 2.5–4.6)

## 2020-06-11 MED ORDER — ACETAMINOPHEN 650 MG RE SUPP: 650.0000 mg | Freq: Four times a day (QID) | RECTAL | Status: DC | PRN

## 2020-06-11 MED ORDER — POTASSIUM CHLORIDE CRYS ER 20 MEQ PO TBCR
40.0000 meq | EXTENDED_RELEASE_TABLET | Freq: Once | ORAL | Status: AC
  Administered 2020-06-11: 40 meq via ORAL
  Filled 2020-06-11: qty 2

## 2020-06-11 MED ORDER — OXYCODONE HCL 5 MG PO TABS
10.0000 mg | ORAL_TABLET | Freq: Four times a day (QID) | ORAL | Status: DC | PRN
  Administered 2020-06-11 – 2020-06-23 (×18): 10 mg via ORAL
  Filled 2020-06-11 (×18): qty 2

## 2020-06-11 MED ORDER — FUROSEMIDE 10 MG/ML IJ SOLN
60.0000 mg | Freq: Two times a day (BID) | INTRAMUSCULAR | Status: DC
  Administered 2020-06-11 – 2020-06-14 (×6): 60 mg via INTRAVENOUS
  Filled 2020-06-11 (×7): qty 6

## 2020-06-11 MED ORDER — NITROFURANTOIN MONOHYD MACRO 100 MG PO CAPS
100.0000 mg | ORAL_CAPSULE | Freq: Two times a day (BID) | ORAL | Status: DC
  Administered 2020-06-11 – 2020-06-15 (×10): 100 mg via ORAL
  Filled 2020-06-11 (×13): qty 1

## 2020-06-11 MED ORDER — MAGNESIUM SULFATE 2 GM/50ML IV SOLN
2.0000 g | Freq: Once | INTRAVENOUS | Status: AC
  Administered 2020-06-11: 2 g via INTRAVENOUS
  Filled 2020-06-11: qty 50

## 2020-06-11 MED ORDER — ACETAMINOPHEN 325 MG PO TABS: 650.0000 mg | ORAL_TABLET | Freq: Four times a day (QID) | ORAL | Status: DC | PRN

## 2020-06-11 MED ORDER — METOPROLOL TARTRATE 50 MG PO TABS
100.0000 mg | ORAL_TABLET | Freq: Two times a day (BID) | ORAL | Status: DC
  Administered 2020-06-11 – 2020-06-29 (×35): 100 mg via ORAL
  Filled 2020-06-11 (×37): qty 2

## 2020-06-11 MED ORDER — METOPROLOL TARTRATE 50 MG PO TABS
50.0000 mg | ORAL_TABLET | Freq: Once | ORAL | Status: AC
  Administered 2020-06-11: 50 mg via ORAL
  Filled 2020-06-11: qty 1

## 2020-06-11 NOTE — Evaluation (Signed)
Occupational Therapy Evaluation Patient Details Name: [name] [name] MRN: [id number] DOB: 10-May-1960 Today's Date: 06/11/2020    History of Present Illness Pt is a 60 y.o. male with medical history significant for morbid obesity, chronic hypoxic respiratory failure, obesity hypoventilation syndrome, hypertension, depression, anxiety, alcohol use disorder, Left ear deafness since the age of 41, COPD, history of nonischemic cardiomyopathy, heart failure reduced ejection fraction, persistent atrial fibrillation, sleep apnea, history of DVT, GERD, hypothyroid, hyperlipidemia, history of bilateral segmental and subsegmental pulmonary emboli, chronic bilateral lower extremity venous stasis, presents to the emergency department for chief concerns of having a fall.  MD assessment includes: SIRS, acute on chronic respiratory failure, A-fib with RVR, alcohol withdrawal, R 6th rib Fx, and FTT.   Clinical Impression   Patient presenting with decreased I in self care, balance, functional mobility/transfers, endurance, and safety awareness.  Patient reports living at home with roommate PTA.He has been having increased difficulty with taking care of himself at home. Patient currently needing total A to attempt to get to EOB. OT able to get pt's B LEs over EOB with total A but then reports need for BM. Pt states, " I need to get on the bedside potty" and showing lack of insight to deficits. Pt needing total A of 2 for rolling and positioning of bed pan. Pt unable to void this session. Pt fatigued very quickly. Patient will benefit from acute OT to increase overall independence in the areas of ADLs, and safety awareness functional mobility, in order to safely discharge to next venue of care.     Follow Up Recommendations  SNF    Equipment Recommendations  Other (comment) (defer to next venue of care)       Precautions / Restrictions Precautions Precautions: Fall      Mobility Bed Mobility Overal bed  mobility: Needs Assistance Bed Mobility: Rolling Rolling: +2 for physical assistance;Max assist         General bed mobility comments: assist for B LEs and trunk control    Transfers                 General transfer comment: deferred secondary to safety        ADL either performed or assessed with clinical judgement   ADL Overall ADL's : Needs assistance/impaired Eating/Feeding: Set up;Bed level   Grooming: Wash/dry hands;Wash/dry face;Bed level;Set up                                 General ADL Comments: Max +2 assist for bed mobility to place pt on bed pan after reporting need for BM     Vision Patient Visual Report: No change from baseline              Pertinent Vitals/Pain Pain Assessment: No/denies pain     Hand Dominance Right   Extremity/Trunk Assessment Upper Extremity Assessment Upper Extremity Assessment: Generalized weakness   Lower Extremity Assessment Lower Extremity Assessment: Generalized weakness       Communication Communication Communication: No difficulties   Cognition Arousal/Alertness: Lethargic Behavior During Therapy: WFL for tasks assessed/performed Overall Cognitive Status: Within Functional Limits for tasks assessed                                                Home Living Family/patient expects  to be discharged to:: Private residence Living Arrangements: Non-relatives/Friends Available Help at Discharge: Personal care attendant;Available PRN/intermittently Type of Home: House Home Access: Ramped entrance     Home Layout: One level     Bathroom Shower/Tub: Chief Strategy Officer: Standard     Home Equipment: Environmental consultant - 4 wheels;Shower seat;Hand held shower head;Wheelchair - power   Additional Comments: Sleeps in regular bed, lift chair      Prior Functioning/Environment Level of Independence: Needs assistance  Gait / Transfers Assistance Needed: Bariatric  rollator and scooter for functional mobility, more difficulty with amb recently, several recent hospital admissions ADL's / Homemaking Assistance Needed: Pt reports having an aide that comes 3x week to assist with self care tasks such as shower as well as IADLs   Comments: Pt reports having transportation for appointments        OT Problem List: Decreased strength;Impaired balance (sitting and/or standing);Decreased safety awareness;Cardiopulmonary status limiting activity;Decreased activity tolerance;Decreased coordination;Decreased knowledge of use of DME or AE      OT Treatment/Interventions: Self-care/ADL training;DME and/or AE instruction;Therapeutic activities;Balance training;Therapeutic exercise;Manual therapy;Energy conservation;Patient/family education;Neuromuscular education    OT Goals(Current goals can be found in the care plan section) Acute Rehab OT Goals Patient Stated Goal: To return home OT Goal Formulation: With patient Time For Goal Achievement: 06/26/20 Potential to Achieve Goals: Good ADL Goals Pt Will Perform Grooming: with supervision Pt Will Perform Lower Body Dressing: with min guard assist Pt Will Transfer to Toilet: with min guard assist Pt Will Perform Toileting - Clothing Manipulation and hygiene: with min guard assist  OT Frequency: Min 2X/week   Barriers to D/C:    none known at this time          AM-PAC OT "6 Clicks" Daily Activity     Outcome Measure Help from another person eating meals?: A Little Help from another person taking care of personal grooming?: A Little Help from another person toileting, which includes using toliet, bedpan, or urinal?: Total Help from another person bathing (including washing, rinsing, drying)?: A Lot Help from another person to put on and taking off regular upper body clothing?: A Little Help from another person to put on and taking off regular lower body clothing?: A Lot 6 Click Score: 14   End of Session  Equipment Utilized During Treatment: Oxygen Nurse Communication: Mobility status  Activity Tolerance: Patient limited by fatigue Patient left: in bed;with call bell/phone within reach;with nursing/sitter in room  OT Visit Diagnosis: Unsteadiness on feet (R26.81);Repeated falls (R29.6);Muscle weakness (generalized) (M62.81)                Time: 1779-3903 OT Time Calculation (min): 35 min Charges:  OT General Charges $OT Visit: 1 Visit OT Evaluation $OT Eval Moderate Complexity: 1 Mod OT Treatments $Self Care/Home Management : 23-37 mins  Jackquline Denmark, MS, OTR/L , CBIS ascom [phone]  06/11/20, 11:28 AM

## 2020-06-11 NOTE — Progress Notes (Signed)
PROGRESS NOTE    [name] [name]  OAC:[id number] DOB: 27-Dec-1960 DOA: 06/08/2020 PCP: [name], DO   Brief Narrative:  60 y.o. 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  He denies head trauma and loss of consciousness.  He endorses abdominal pain.  He told EMS that he did not want to go to the hospital and that he just wants to be left alone to die.  At bedside, he denies fever, chest pain, changes to his short baseline shortness of breath.  She denies dysuria and hematuria.  He endorses nausea, and denies vomiting.  He endorses to me that he drank alcohol 4 to 5 hours prior to presentation to the emergency department.  Denies SI and HI.  Presentation consistent with acute on chronic respiratory failure likely secondary to COPD/CHF.  Remains stepdown status.  Was on BiPAP since admission, taken off on 4/25.  Tolerating nasal cannula okay but increased work of breathing.  4/26: Patient apparently began to exhibit some withdrawal symptoms today.  Seen by palliative care.  Patient's birthday is in [birth year] and he wishes to live long enough to see his birthday.  He confirms DNR/DNI status but would like to treat the treatable.  He endorses some shoulder pain as well   Assessment & Plan:   Active Problems:   Centrilobular emphysema (HCC)   Sepsis (HCC)   Chronic atrial fibrillation (HCC)   HTN (hypertension)   Chronic gout  involving toe, unspecified cause, unspecified laterality   TBI (traumatic brain injury) (HCC)   Personal history of DVT (deep vein thrombosis)   Atrial fibrillation with RVR (HCC)   Major depressive disorder, recurrent episode, in partial remission (HCC)   HLD (hyperlipidemia)   Depression   Pressure ulcer   Alcohol abuse   Alcohol withdrawal syndrome without complication (HCC)   GAD (generalized anxiety disorder)   COPD (chronic obstructive pulmonary disease) (HCC)   Atrial fibrillation, chronic (HCC)   Class 3 obesity with alveolar hypoventilation, serious comorbidity, and body mass index (BMI) of 60.0 to 69.9 in adult [facility])   CHF (congestive heart failure) (HCC)   Weakness of both lower extremities   Physical deconditioning   Elevated lactic acid level   Unable to care for self  SIRS criteria Sepsis ruled out Lactic acid normalized Blood cultures no growth to date . Procalcitonin negative Will monitor; urine discussed below  Aerococcus bacteriuria - will touch base w/ ID pharm regarding this pathogen, from what I can tell is usually a contaminant though patient does describe dysuria - will start macrobid  Acute on chronic respiratory failure HFrEF EF 30-35 earlier this year. Requiring intermittent bipap. Currently on [place] O2. Obesity contributory. - BiPAP as needed - Supplemental oxygen as needed - increase furosemide from 40 to 60 IV bid - strict I/os  Atrial fibrillation with rapid ventricular response Multifactorial Alcohol withdrawal versus acute intoxication Remains dependent on diltiazem drip but now on low dose Plan: Continue stepdown status for now - will attempt to wean off dilt gtt - increase metop tart to 100 bid - cont eliquis  Alcohol use, suspect intoxication Alcohol withdrawal CIWA protocol initiated Patient began to exhibit withdrawal symptoms on 4/26 Patient drinks half gallon of whiskey a day High risk for withdrawals Plan: Continue CIWA  protocol with as needed Ativan  Right sixth rib fracture Acute to subacute Symptomatic management - oxy prn for pain  Hypertension Lisinopril 2.5 mg daily Metoprolol tartrate 100 mg twice daily Wean Cardizem gtt. as allowed  Depression Duloxetine 20 mg daily  Hypothyroid Synthroid 75 micrograms daily  OSA/OHS Nightly BiPAP  Morbid obesity This complicates overall care and prognosis  Multiple medical comorbidities Failure to thrive Patient has number of medical comorbidities and frequent readmissions.  He has expressed desires to not pursue aggressive medical intervention.  Despite his young age I feel he may be a candidate for home hospice services to prevent future readmissions which would be against his wishes. Plan: Patient was seen by palliative care.  t this point patient would like to treat the treatable despite the fact that he is in pain.  He is aware of the fact that he may be suffering and may not live to see his birthday in July of this year.  He confirmed DNR/DNI status but would like to treat the treatable.      DVT prophylaxis: Eliquis Code Status: DNR Family Communication: None today.  Offered to call the patient declined Disposition Plan: Status is: Inpatient  Remains inpatient appropriate because:Inpatient level of care appropriate due to severity of illness   Dispo: The patient is from: Home              Anticipated d/c is to: Undetermined at this time              Patient currently is not medically stable to d/c.   Difficult to place patient No  Patient with acute on chronic hypoxic and hypercarbic respiratory failure.  Multifactorial in origin.  Atrial fibrillation right ventricular response.  Now exhibiting signs of alcohol withdrawal.    . Level of care: Stepdown  Consultants:   None   Procedures: None  Antimicrobials:   Nitrofurantoin 4/27>   Subjective: Patient seen and examined.  Sleepy but awakens and responds appropriately  to question. Chronic right shoulder pain.   Objective: Vitals:   06/11/20 0300 06/11/20 0400 06/11/20 0500 06/11/20 0600  BP: 123/81  123/71 (!) 167/97  Pulse: 69 79 71 82  Resp: 14 18 17 19   Temp:   (!) 97.2 F (36.2 C)   TempSrc:   Axillary   SpO2: 98% 93% 97% 100%  Weight:      Height:        Intake/Output Summary (Last 24 hours) at 06/11/2020 1014 Last data filed at 06/11/2020 0600 Gross per 24 hour  Intake 149.42 ml  Output 1320 ml  Net -1170.58 ml   Filed Weights   06/08/20 0913 06/08/20 2045  Weight: (!) 186 kg (!) 178.9 kg    Examination:  General exam: NAD Respiratory system: mild tachypnea, scattered crackles,  Cardiovascular system: Tachycardic, irregular rhythm, no murmurs Gastrointestinal system: Obese, nontender, nondistended, normal bowel sounds Central nervous system: Alert and oriented, lethargic, no focal deficits  extremities: Diffusely decreased power bilaterally. Skin: Dry skin, scattered excoriations. 2+ pitting LE edema Psychiatry: Judgement and insight appear impaired. Mood & affect flattened.     Data Reviewed: I have personally reviewed following labs and imaging studies  CBC: Recent Labs  Lab 06/08/20 0919 06/09/20 0053  WBC 8.5 9.7  NEUTROABS 6.0  --  HGB 12.9* 12.3*  HCT 38.6* 37.7*  MCV 96.0 97.4  PLT 225 161   Basic Metabolic Panel: Recent Labs  Lab 06/08/20 0919 06/09/20 0053 06/10/20 0442 06/11/20 0357  NA 134* 136 135 135  K 3.7 3.4* 3.3* 4.1  CL 84* 90* 88* 86*  CO2 31 31 37* 36*  GLUCOSE 147* 162* 142* 115*  BUN 12 11 12 15   CREATININE 0.85 0.52* 0.56* 0.72  CALCIUM 8.2* 8.1* 8.3* 8.7*  MG  --  1.3* 1.5* 1.8  PHOS  --  2.4* 1.8* 2.9   GFR: Estimated Creatinine Clearance: 158.3 mL/min (by C-G formula based on SCr of 0.72 mg/dL). Liver Function Tests: Recent Labs  Lab 06/08/20 0919  AST 25  ALT 12  ALKPHOS 82  BILITOT 0.7  PROT 7.4  ALBUMIN 3.6   No results for input(s): LIPASE, AMYLASE in the last  168 hours. No results for input(s): AMMONIA in the last 168 hours. Coagulation Profile: Recent Labs  Lab 06/08/20 0919 06/09/20 0053  INR 1.0 1.1   Cardiac Enzymes: No results for input(s): CKTOTAL, CKMB, CKMBINDEX, TROPONINI in the last 168 hours. BNP (last 3 results) No results for input(s): PROBNP in the last 8760 hours. HbA1C: No results for input(s): HGBA1C in the last 72 hours. CBG: Recent Labs  Lab 06/08/20 2045  GLUCAP 154*   Lipid Profile: No results for input(s): CHOL, HDL, LDLCALC, TRIG, CHOLHDL, LDLDIRECT in the last 72 hours. Thyroid Function Tests: No results for input(s): TSH, T4TOTAL, FREET4, T3FREE, THYROIDAB in the last 72 hours. Anemia Panel: No results for input(s): VITAMINB12, FOLATE, FERRITIN, TIBC, IRON, RETICCTPCT in the last 72 hours. Sepsis Labs: Recent Labs  Lab 06/08/20 1538 06/08/20 2045 06/09/20 0053 06/09/20 0834 06/09/20 1131  PROCALCITON  --   --  <0.10  --   --   LATICACIDVEN 4.5* 3.9*  --  1.6 1.6    Recent Results (from the past 240 hour(s))  Blood culture (routine single)     Status: None (Preliminary result)   Collection Time: 06/08/20  9:19 AM   Specimen: BLOOD  Result Value Ref Range Status   Specimen Description BLOOD RIGHT [facility]  Final   Special Requests   Final    BOTTLES DRAWN AEROBIC AND ANAEROBIC Blood Culture results may not be optimal due to an inadequate volume of blood received in culture bottles   Culture   Final    NO GROWTH 3 DAYS Performed at [facility], [address] [place], [place]    Report Status PENDING  Incomplete  Urine culture     Status: Abnormal   Collection Time: 06/08/20  9:19 AM   Specimen: Urine, Random  Result Value Ref Range Status   Specimen Description   Final    URINE, RANDOM Performed at [facility], [address], [place], [place]    Special Requests   Final    NONE Performed at [facility], [address], [place], [place]  [place]    Culture (A)  Final    >=100,000 COLONIES/mL AEROCOCCUS URINAE 20,000 COLONIES/mL ENTEROCOCCUS FAECALIS Standardized susceptibility testing for this organism is not available. Performed at [facility], [address] [place], [place]    Report Status 06/11/2020 FINAL  Final   Organism ID, Bacteria ENTEROCOCCUS FAECALIS (A)  Final      Susceptibility   Enterococcus faecalis - MIC*    AMPICILLIN <=2 SENSITIVE Sensitive     NITROFURANTOIN <=16  SENSITIVE Sensitive     VANCOMYCIN 1 SENSITIVE Sensitive     * 20,000 COLONIES/mL ENTEROCOCCUS FAECALIS  Resp Panel by RT-PCR (Flu A&B, Covid) Nasopharyngeal Swab     Status: None   Collection Time: 06/08/20  9:22 AM   Specimen: Nasopharyngeal Swab; Nasopharyngeal(NP) swabs in vial transport medium  Result Value Ref Range Status   SARS Coronavirus 2 by RT PCR NEGATIVE NEGATIVE Final    Comment: (NOTE) SARS-CoV-2 target nucleic acids are NOT DETECTED.  The SARS-CoV-2 RNA is generally detectable in upper respiratory specimens during the acute phase of infection. The lowest concentration of SARS-CoV-2 viral copies this assay can detect is 138 copies/mL. A negative result does not preclude SARS-Cov-2 infection and should not be used as the sole basis for treatment or other patient management decisions. A negative result may occur with  improper specimen collection/handling, submission of specimen other than nasopharyngeal swab, presence of viral mutation(s) within the areas targeted by this assay, and inadequate number of viral copies(<138 copies/mL). A negative result must be combined with clinical observations, patient history, and epidemiological information. The expected result is Negative.  Fact Sheet for Patients:  BloggerCourse.com  Fact Sheet for Healthcare Providers:  SeriousBroker.it  This test is no t yet approved or cleared by the [place] FDA and   has been authorized for detection and/or diagnosis of SARS-CoV-2 by FDA under an Emergency Use Authorization (EUA). This EUA will remain  in effect (meaning this test can be used) for the duration of the COVID-19 declaration under Section 564(b)(1) of the Act, 21 U.S.C.section 360bbb-3(b)(1), unless the authorization is terminated  or revoked sooner.       Influenza A by PCR NEGATIVE NEGATIVE Final   Influenza B by PCR NEGATIVE NEGATIVE Final    Comment: (NOTE) The Xpert Xpress SARS-CoV-2/FLU/RSV plus assay is intended as an aid in the diagnosis of influenza from Nasopharyngeal swab specimens and should not be used as a sole basis for treatment. Nasal washings and aspirates are unacceptable for Xpert Xpress SARS-CoV-2/FLU/RSV testing.  Fact Sheet for Patients: BloggerCourse.com  Fact Sheet for Healthcare Providers: SeriousBroker.it  This test is not yet approved or cleared by the [place] FDA and has been authorized for detection and/or diagnosis of SARS-CoV-2 by FDA under an Emergency Use Authorization (EUA). This EUA will remain in effect (meaning this test can be used) for the duration of the COVID-19 declaration under Section 564(b)(1) of the Act, 21 U.S.C. section 360bbb-3(b)(1), unless the authorization is terminated or revoked.  Performed at [facility], [address] [place], [place] [address]   Culture, blood (single)     Status: None (Preliminary result)   Collection Time: 06/08/20  1:51 PM   Specimen: BLOOD  Result Value Ref Range Status   Specimen Description BLOOD LEFT ARM  Final   Special Requests   Final    BOTTLES DRAWN AEROBIC AND ANAEROBIC Blood Culture results may not be optimal due to an excessive volume of blood received in culture bottles   Culture   Final    NO GROWTH 3 DAYS Performed at [facility], [address] [place], [place] [address]    Report Status PENDING   Incomplete  MRSA PCR Screening     Status: Abnormal   Collection Time: 06/08/20  8:42 PM   Specimen: Nasopharyngeal  Result Value Ref Range Status   MRSA by PCR POSITIVE (A) NEGATIVE Final    Comment:        The GeneXpert  MRSA Assay (FDA approved for NASAL specimens only), is one component of a comprehensive MRSA colonization surveillance program. It is not intended to diagnose MRSA infection nor to guide or monitor treatment for MRSA infections. RESULT CALLED TO, READ BACK BY AND VERIFIED WITH: NOTIFIED Reynolds Bowl RN 06/08/2020 2311 JG Performed at [facility], [address] [place], [place] [address]          Radiology Studies: No results found.      Scheduled Meds: . apixaban  5 mg Oral BID  . aspirin  81 mg Oral Daily  . atorvastatin  40 mg Oral Daily  . Chlorhexidine Gluconate Cloth  6 each Topical Daily  . DULoxetine  20 mg Oral Daily  . folic acid  1 mg Oral Daily  . furosemide  40 mg Intravenous Q12H  . gabapentin  300 mg Oral TID  . levothyroxine  75 mcg Oral QAC breakfast  . lisinopril  2.5 mg Oral Daily  . LORazepam  0-4 mg Intravenous Q12H   Or  . LORazepam  0-4 mg Oral Q12H  . LORazepam  0-4 mg Oral Q8H  . methocarbamol  750 mg Oral TID  . metoprolol tartrate  50 mg Oral BID  . montelukast  10 mg Oral QHS  . multivitamin with minerals  1 tablet Oral Daily  . mupirocin ointment  1 application Nasal BID  . Ensure Max Protein  11 oz Oral TID WC  . thiamine  100 mg Oral Daily   Or  . thiamine  100 mg Intravenous Daily   Continuous Infusions: . diltiazem (CARDIZEM) infusion 5 mg/hr (06/11/20 0944)  . magnesium sulfate bolus IVPB       LOS: 3 days    Time spent: 35 minutes    Silvano Bilis, MD [facility]  If 7PM-7AM, please contact night-coverage 06/11/2020, 10:14 AM

## 2020-06-11 NOTE — Progress Notes (Signed)
Patient not easily arousable. Patient very drowsy. Patient looks pale and feels cold to the touch. Patient Temperature and other vital signs are WDL. Secure chatted Dr. Antionette Char about patient condition and asked if we could get a blood gas on the patient. Order for blood gas was placed. Patient was placed on BIPAP by respiratory. Will continue patient care.

## 2020-06-11 NOTE — Consult Note (Signed)
PHARMACY CONSULT NOTE  Pharmacy Consult for Electrolyte Monitoring and Replacement   Recent Labs: Potassium (mmol/L)  Date Value  06/11/2020 4.1   Magnesium (mg/dL)  Date Value  69/45/0388 1.8   Calcium (mg/dL)  Date Value  82/80/0349 8.7 (L)   Albumin (g/dL)  Date Value  17/91/5056 3.6  05/02/2019 3.8   Phosphorus (mg/dL)  Date Value  97/94/8016 2.9   Sodium (mmol/L)  Date Value  06/11/2020 135  06/06/2019 139    Assessment: Patient is a 60 y/o M with medical history including morbid obesity, CHF, chronic hypoxic respiratory failure, COPD, depression / anxiety, alcohol use disorder, atrial fibrillation, VTE who presented to the ED 4/24 after fall in setting of alcohol use. Pharmacy consulted to assist with electrolyte monitoring and replacement as indicated.  Diuretics: IV Lasix 40 mg BID  Goal of Therapy:  K > 4 Mg > 2 All other electrolytes within normal limits  Plan:  --K 4.1 - pt remains on lasix, will order PO KCl x1  --Mg 1.8 - will order 2g IV Mg sulfate  --Will follow-up electrolytes with AM labs tomorrow  Raiford Noble, PharmD Pharmacy Resident  06/11/2020 6:48 AM

## 2020-06-12 DIAGNOSIS — Z7189 Other specified counseling: Secondary | ICD-10-CM | POA: Diagnosis not present

## 2020-06-12 DIAGNOSIS — I4891 Unspecified atrial fibrillation: Secondary | ICD-10-CM | POA: Diagnosis not present

## 2020-06-12 LAB — BASIC METABOLIC PANEL
Anion gap: 11 (ref 5–15)
BUN: 17 mg/dL (ref 6–20)
CO2: 41 mmol/L — ABNORMAL HIGH (ref 22–32)
Calcium: 8.8 mg/dL — ABNORMAL LOW (ref 8.9–10.3)
Chloride: 87 mmol/L — ABNORMAL LOW (ref 98–111)
Creatinine, Ser: 0.66 mg/dL (ref 0.61–1.24)
GFR, Estimated: >60 mL/min (ref >60)
Glucose, Bld: 106 mg/dL — ABNORMAL HIGH (ref 70–99)
Potassium: 4.4 mmol/L (ref 3.5–5.1)
Sodium: 139 mmol/L (ref 135–145)

## 2020-06-12 LAB — CBC
HCT: 38.9 % — ABNORMAL LOW (ref 39.0–52.0)
Hemoglobin: 11.8 g/dL — ABNORMAL LOW (ref 13.0–17.0)
MCH: 32.2 pg (ref 26.0–34.0)
MCHC: 30.3 g/dL (ref 30.0–36.0)
MCV: 106 fL — ABNORMAL HIGH (ref 80.0–100.0)
Platelets: 137 10*3/uL — ABNORMAL LOW (ref 150–400)
RBC: 3.67 MIL/uL — ABNORMAL LOW (ref 4.22–5.81)
RDW: 16.8 % — ABNORMAL HIGH (ref 11.5–15.5)
WBC: 7.1 10*3/uL (ref 4.0–10.5)
nRBC: 0 % (ref 0.0–0.2)

## 2020-06-12 LAB — PHOSPHORUS: Phosphorus: 3.1 mg/dL (ref 2.5–4.6)

## 2020-06-12 LAB — TSH: TSH: 11.443 u[IU]/mL — ABNORMAL HIGH (ref 0.350–4.500)

## 2020-06-12 LAB — MAGNESIUM: Magnesium: 1.8 mg/dL (ref 1.7–2.4)

## 2020-06-12 LAB — GLUCOSE, CAPILLARY: Glucose-Capillary: 127 mg/dL — ABNORMAL HIGH (ref 70–99)

## 2020-06-12 MED ORDER — POLYETHYLENE GLYCOL 3350 17 G PO PACK
17.0000 g | PACK | Freq: Every day | ORAL | Status: DC
  Administered 2020-06-12 – 2020-06-29 (×11): 17 g via ORAL
  Filled 2020-06-12 (×16): qty 1

## 2020-06-12 MED ORDER — ACETAMINOPHEN 325 MG PO TABS: 650.0000 mg | ORAL_TABLET | Freq: Four times a day (QID) | ORAL | Status: DC | PRN

## 2020-06-12 MED ORDER — MAGNESIUM SULFATE 2 GM/50ML IV SOLN
2.0000 g | Freq: Once | INTRAVENOUS | Status: AC
  Administered 2020-06-12: 2 g via INTRAVENOUS
  Filled 2020-06-12: qty 50

## 2020-06-12 MED ORDER — ACETAMINOPHEN 650 MG RE SUPP: 650.0000 mg | Freq: Four times a day (QID) | RECTAL | Status: DC | PRN

## 2020-06-12 NOTE — Progress Notes (Signed)
Patient A&Ox4 today, mildly irritable. Patient refusing bipap but holding 90- 95 on HFNC @ 5L. Patient able to roll effectively for bed and dressing change with a moderate x2 assist at this time.

## 2020-06-12 NOTE — Progress Notes (Addendum)
PROGRESS NOTE    [name] [name]  GQQ:[id number] DOB: 1960/03/29 DOA: 06/08/2020 PCP: [name], DO   Brief Narrative:  60 y.o. 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  He denies head trauma and loss of consciousness.  He endorses abdominal pain.  He told EMS that he did not want to go to the hospital and that he just wants to be left alone to die.  At bedside, he denies fever, chest pain, changes to his short baseline shortness of breath.  She denies dysuria and hematuria.  He endorses nausea, and denies vomiting.  He endorses to me that he drank alcohol 4 to 5 hours prior to presentation to the emergency department.  Denies SI and HI.  Presentation consistent with acute on chronic respiratory failure likely secondary to COPD/CHF.  Remains stepdown status.  Was on BiPAP since admission, taken off on 4/25.  Tolerating nasal cannula okay but increased work of breathing.  4/26: Patient apparently began to exhibit some withdrawal symptoms today.  Seen by palliative care.  Patient's birthday is in [birth year] and he wishes to live long enough to see his birthday.  He confirms DNR/DNI status but would like to treat the treatable.  He endorses some shoulder pain as well   Assessment & Plan:   Active Problems:   Centrilobular emphysema (HCC)   Sepsis (HCC)   Chronic atrial fibrillation (HCC)   HTN (hypertension)   Chronic gout  involving toe, unspecified cause, unspecified laterality   TBI (traumatic brain injury) (HCC)   Personal history of DVT (deep vein thrombosis)   Atrial fibrillation with RVR (HCC)   Major depressive disorder, recurrent episode, in partial remission (HCC)   HLD (hyperlipidemia)   Depression   Pressure ulcer   Alcohol abuse   Alcohol withdrawal syndrome without complication (HCC)   GAD (generalized anxiety disorder)   COPD (chronic obstructive pulmonary disease) (HCC)   Atrial fibrillation, chronic (HCC)   Class 3 obesity with alveolar hypoventilation, serious comorbidity, and body mass index (BMI) of 60.0 to 69.9 in adult [facility])   CHF (congestive heart failure) (HCC)   Weakness of both lower extremities   Physical deconditioning   Elevated lactic acid level   Unable to care for self   Aerococcus bacteriuria Typically a contaminant but pt endorses dysuria. Discussed w/ pharm, nitrofurantoin typically covers - continue macrobid (4/27> ), plan for 5 day course  Acute on chronic respiratory failure HFrEF EF 30-35 earlier this year. Requiring intermittent bipap. Currently on [place] O2. Obesity contributory. Overnight somnolent and hypercarbic, refused bipap. This morning awake and alert. On 2-3 L chronically - BiPAP as needed -  Continue O2. - increased furosemide from 40 to 60 IV bid - strict I/os  Atrial fibrillation with rapid ventricular response Multifactorial Alcohol withdrawal versus acute intoxication Has been weaned of diltiazem gtt - transfer to progressive bed - have increased metop to 100 bid - cont eliquis  Alcohol use, suspect intoxication Alcohol withdrawal CIWA protocol initiated Patient began to exhibit withdrawal symptoms on 4/26 Patient drinks half gallon of whiskey a day High  risk for withdrawal but has not required ativan last 48 hours - continue ciwa for now  Right sixth rib fracture Acute to subacute Symptomatic management - oxy prn for  pain  Hypertension Controlled - cont lisinopril and metoprolol  Thrombocytopenia Mild, 137, normal yesterday - monitor for now  Depression Duloxetine 20 mg daily  Hypothyroid Synthroid 75 micrograms daily - f/u tsh  OSA/OHS Nightly BiPAP  Morbid obesity This complicates overall care and prognosis  Multiple medical comorbidities Failure to thrive Patient has number of medical comorbidities and frequent readmissions.  He has expressed desires to not pursue aggressive medical intervention.  Despite his young age I feel he may be a candidate for home hospice services to prevent future readmissions which would be against his wishes. Plan: Patient was seen by palliative care.  At this point patient would like to treat the treatable despite the fact that he is in pain.  He is aware of the fact that he may be suffering and may not live to see his birthday in July of this year.  He confirmed DNR/DNI status but would like to treat the treatable.      DVT prophylaxis: Eliquis Code Status: DNR Family Communication: sister Asher MuirJamie updated telephonically 4/28 Disposition Plan: Status is: Inpatient  Remains inpatient appropriate because:Inpatient level of care appropriate due to severity of illness   Dispo: The patient is from: Home              Anticipated d/c is to: snf vs home. Pt reluctantly agreed to snf on 4/28, will discuss w/ care mgr to research snf and home health options (apparently has "burned bridges" with home health in the past so may be difficult to get that arranged)              Patient currently is not medically stable to d/c.   Difficult to place patient No  . Level of care: Progressive Cardiac  Consultants:   None   Procedures: None  Antimicrobials:   Nitrofurantoin 4/27>   Subjective: Patient seen and examined.  Awake and alert. Says breathing stable. No chest pain.   Objective: Vitals:   06/12/20 0746 06/12/20 0915 06/12/20 1030 06/12/20 1100   BP:  (!) 154/76  (!) 135/93  Pulse:  81 90 89  Resp:  15  18  Temp:      TempSrc:      SpO2: 94% 97%  95%  Weight:      Height:        Intake/Output Summary (Last 24 hours) at 06/12/2020 1301 Last data filed at 06/12/2020 1005 Gross per 24 hour  Intake 69.62 ml  Output 1250 ml  Net -1180.38 ml   Filed Weights   06/08/20 0913 06/08/20 2045  Weight: (!) 186 kg (!) 178.9 kg    Examination:  General exam: NAD Respiratory system: mild tachypnea, scattered crackles,  Cardiovascular system: Tachycardic, irregular rhythm, no murmurs Gastrointestinal system: Obese, nontender, nondistended, normal bowel sounds Central nervous system: Alert and oriented, lethargic, no focal deficits  extremities: Diffusely decreased power bilaterally. Skin: Dry skin, scattered excoriations. 2+ pitting LE edema Psychiatry: Judgement and insight appear impaired. Mood & affect flattened.     Data Reviewed: I have personally reviewed following labs and imaging studies  CBC: Recent Labs  Lab 06/08/20 0919 06/09/20 0053 06/12/20 0437  WBC 8.5 9.7 7.1  NEUTROABS 6.0  --   --   HGB 12.9* 12.3* 11.8*  HCT 38.6* 37.7* 38.9*  MCV 96.0 97.4 106.0*  PLT 225 161 137*   Basic Metabolic Panel: Recent Labs  Lab 06/08/20 0919 06/09/20 0053 06/10/20 0442 06/11/20 0357 06/12/20 0437  NA 134* 136 135 135 139  K 3.7 3.4* 3.3* 4.1 4.4  CL 84* 90* 88* 86* 87*  CO2 31 31 37* 36* 41*  GLUCOSE 147* 162* 142* 115* 106*  BUN 12 11 12 15 17   CREATININE 0.85 0.52* 0.56* 0.72 0.66  CALCIUM 8.2* 8.1* 8.3* 8.7* 8.8*  MG  --  1.3* 1.5* 1.8 1.8  PHOS  --  2.4* 1.8* 2.9 3.1   GFR: Estimated Creatinine Clearance: 158.3 mL/min (by C-G formula based on SCr of 0.66 mg/dL). Liver Function Tests: Recent Labs  Lab 06/08/20 0919  AST 25  ALT 12  ALKPHOS 82  BILITOT 0.7  PROT 7.4  ALBUMIN 3.6   No results for input(s): LIPASE, AMYLASE in the last 168 hours. No results for input(s): AMMONIA in the last 168  hours. Coagulation Profile: Recent Labs  Lab 06/08/20 0919 06/09/20 0053  INR 1.0 1.1   Cardiac Enzymes: No results for input(s): CKTOTAL, CKMB, CKMBINDEX, TROPONINI in the last 168 hours. BNP (last 3 results) No results for input(s): PROBNP in the last 8760 hours. HbA1C: No results for input(s): HGBA1C in the last 72 hours. CBG: Recent Labs  Lab 06/08/20 2045 06/12/20 1147  GLUCAP 154* 127*   Lipid Profile: No results for input(s): CHOL, HDL, LDLCALC, TRIG, CHOLHDL, LDLDIRECT in the last 72 hours. Thyroid Function Tests: No results for input(s): TSH, T4TOTAL, FREET4, T3FREE, THYROIDAB in the last 72 hours. Anemia Panel: No results for input(s): VITAMINB12, FOLATE, FERRITIN, TIBC, IRON, RETICCTPCT in the last 72 hours. Sepsis Labs: Recent Labs  Lab 06/08/20 1538 06/08/20 2045 06/09/20 0053 06/09/20 0834 06/09/20 1131  PROCALCITON  --   --  <0.10  --   --   LATICACIDVEN 4.5* 3.9*  --  1.6 1.6    Recent Results (from the past 240 hour(s))  Blood culture (routine single)     Status: None (Preliminary result)   Collection Time: 06/08/20  9:19 AM   Specimen: BLOOD  Result Value Ref Range Status   Specimen Description BLOOD RIGHT [facility]  Final   Special Requests   Final    BOTTLES DRAWN AEROBIC AND ANAEROBIC Blood Culture results may not be optimal due to an inadequate volume of blood received in culture bottles   Culture   Final    NO GROWTH 3 DAYS Performed at [facility], [address] [place], [place]    Report Status PENDING  Incomplete  Urine culture     Status: Abnormal   Collection Time: 06/08/20  9:19 AM   Specimen: Urine, Random  Result Value Ref Range Status   Specimen Description   Final    URINE, RANDOM Performed at [facility], [address], [place], [place]    Special Requests   Final    NONE Performed at [facility], [address] [place], [place]    Culture (A)  Final     >=100,000 COLONIES/mL AEROCOCCUS URINAE 20,000 COLONIES/mL ENTEROCOCCUS FAECALIS Standardized susceptibility testing for this organism is not available. Performed at [facility], [address], [place], [place]    Report Status 06/11/2020 FINAL  Final   Organism ID, Bacteria ENTEROCOCCUS FAECALIS (A)  Final      Susceptibility   Enterococcus faecalis - MIC*    AMPICILLIN <=2 SENSITIVE Sensitive  NITROFURANTOIN <=16 SENSITIVE Sensitive     VANCOMYCIN 1 SENSITIVE Sensitive     * 20,000 COLONIES/mL ENTEROCOCCUS FAECALIS  Resp Panel by RT-PCR (Flu A&B, Covid) Nasopharyngeal Swab     Status: None   Collection Time: 06/08/20  9:22 AM   Specimen: Nasopharyngeal Swab; Nasopharyngeal(NP) swabs in vial transport medium  Result Value Ref Range Status   SARS Coronavirus 2 by RT PCR NEGATIVE NEGATIVE Final    Comment: (NOTE) SARS-CoV-2 target nucleic acids are NOT DETECTED.  The SARS-CoV-2 RNA is generally detectable in upper respiratory specimens during the acute phase of infection. The lowest concentration of SARS-CoV-2 viral copies this assay can detect is 138 copies/mL. A negative result does not preclude SARS-Cov-2 infection and should not be used as the sole basis for treatment or other patient management decisions. A negative result may occur with  improper specimen collection/handling, submission of specimen other than nasopharyngeal swab, presence of viral mutation(s) within the areas targeted by this assay, and inadequate number of viral copies(<138 copies/mL). A negative result must be combined with clinical observations, patient history, and epidemiological information. The expected result is Negative.  Fact Sheet for Patients:  BloggerCourse.com  Fact Sheet for Healthcare Providers:  SeriousBroker.it  This test is no t yet approved or cleared by the [place] FDA and  has been authorized for detection  and/or diagnosis of SARS-CoV-2 by FDA under an Emergency Use Authorization (EUA). This EUA will remain  in effect (meaning this test can be used) for the duration of the COVID-19 declaration under Section 564(b)(1) of the Act, 21 U.S.C.section 360bbb-3(b)(1), unless the authorization is terminated  or revoked sooner.       Influenza A by PCR NEGATIVE NEGATIVE Final   Influenza B by PCR NEGATIVE NEGATIVE Final    Comment: (NOTE) The Xpert Xpress SARS-CoV-2/FLU/RSV plus assay is intended as an aid in the diagnosis of influenza from Nasopharyngeal swab specimens and should not be used as a sole basis for treatment. Nasal washings and aspirates are unacceptable for Xpert Xpress SARS-CoV-2/FLU/RSV testing.  Fact Sheet for Patients: BloggerCourse.com  Fact Sheet for Healthcare Providers: SeriousBroker.it  This test is not yet approved or cleared by the [place] FDA and has been authorized for detection and/or diagnosis of SARS-CoV-2 by FDA under an Emergency Use Authorization (EUA). This EUA will remain in effect (meaning this test can be used) for the duration of the COVID-19 declaration under Section 564(b)(1) of the Act, 21 U.S.C. section 360bbb-3(b)(1), unless the authorization is terminated or revoked.  Performed at [facility], [address] [place], [place] [address]   Culture, blood (single)     Status: None (Preliminary result)   Collection Time: 06/08/20  1:51 PM   Specimen: BLOOD  Result Value Ref Range Status   Specimen Description BLOOD LEFT ARM  Final   Special Requests   Final    BOTTLES DRAWN AEROBIC AND ANAEROBIC Blood Culture results may not be optimal due to an excessive volume of blood received in culture bottles   Culture   Final    NO GROWTH 3 DAYS Performed at [facility], [address] [place], [place] [address]    Report Status PENDING  Incomplete  MRSA PCR Screening      Status: Abnormal   Collection Time: 06/08/20  8:42 PM   Specimen: Nasopharyngeal  Result Value Ref Range Status   MRSA by PCR POSITIVE (A) NEGATIVE Final    Comment:  The GeneXpert MRSA Assay (FDA approved for NASAL specimens only), is one component of a comprehensive MRSA colonization surveillance program. It is not intended to diagnose MRSA infection nor to guide or monitor treatment for MRSA infections. RESULT CALLED TO, READ BACK BY AND VERIFIED WITH: NOTIFIED Reynolds Bowl RN 06/08/2020 2311 JG Performed at [facility], [address] [place], [place] [address]          Radiology Studies: No results found.      Scheduled Meds: . apixaban  5 mg Oral BID  . aspirin  81 mg Oral Daily  . atorvastatin  40 mg Oral Daily  . Chlorhexidine Gluconate Cloth  6 each Topical Daily  . DULoxetine  20 mg Oral Daily  . folic acid  1 mg Oral Daily  . furosemide  60 mg Intravenous Q12H  . gabapentin  300 mg Oral TID  . levothyroxine  75 mcg Oral QAC breakfast  . lisinopril  2.5 mg Oral Daily  . LORazepam  0-4 mg Intravenous Q12H   Or  . LORazepam  0-4 mg Oral Q12H  . LORazepam  0-4 mg Oral Q8H  . methocarbamol  750 mg Oral TID  . metoprolol tartrate  100 mg Oral BID  . montelukast  10 mg Oral QHS  . multivitamin with minerals  1 tablet Oral Daily  . mupirocin ointment  1 application Nasal BID  . nitrofurantoin (macrocrystal-monohydrate)  100 mg Oral Q12H  . polyethylene glycol  17 g Oral Daily  . Ensure Max Protein  11 oz Oral TID WC  . thiamine  100 mg Oral Daily   Or  . thiamine  100 mg Intravenous Daily   Continuous Infusions: . diltiazem (CARDIZEM) infusion Stopped (06/11/20 1052)  . magnesium sulfate bolus IVPB       LOS: 4 days    Time spent: 25 minutes    Silvano Bilis, MD [facility]  If 7PM-7AM, please contact night-coverage 06/12/2020, 1:01 PM

## 2020-06-12 NOTE — Progress Notes (Signed)
PT Cancellation Note  Patient Details Name: [name] [name] MRN: [id number] DOB: 05/23/1960   Cancelled Treatment:     PT attempt. Pt was very lethargic. Asleep upon arriving but did awake however struggles to stay awake. Pt requested to hold PT today and request author return 9am 06/13/20. Acute PT will continue efforts to progress per POC.    [name] 06/12/2020, 3:56 PM
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
## 2020-06-13 DIAGNOSIS — E1169 Type 2 diabetes mellitus with other specified complication: Secondary | ICD-10-CM

## 2020-06-13 DIAGNOSIS — F10230 Alcohol dependence with withdrawal, uncomplicated: Secondary | ICD-10-CM | POA: Diagnosis not present

## 2020-06-13 DIAGNOSIS — I482 Chronic atrial fibrillation, unspecified: Secondary | ICD-10-CM | POA: Diagnosis not present

## 2020-06-13 DIAGNOSIS — Z66 Do not resuscitate: Secondary | ICD-10-CM

## 2020-06-13 DIAGNOSIS — I50812 Chronic right heart failure: Secondary | ICD-10-CM | POA: Diagnosis not present

## 2020-06-13 DIAGNOSIS — I4891 Unspecified atrial fibrillation: Secondary | ICD-10-CM | POA: Diagnosis not present

## 2020-06-13 DIAGNOSIS — F1010 Alcohol abuse, uncomplicated: Secondary | ICD-10-CM | POA: Diagnosis not present

## 2020-06-13 LAB — CULTURE, BLOOD (SINGLE)
Culture: NO GROWTH
Culture: NO GROWTH

## 2020-06-13 LAB — BASIC METABOLIC PANEL
Anion gap: 11 (ref 5–15)
BUN: 17 mg/dL (ref 6–20)
CO2: 43 mmol/L — ABNORMAL HIGH (ref 22–32)
Calcium: 8.7 mg/dL — ABNORMAL LOW (ref 8.9–10.3)
Chloride: 82 mmol/L — ABNORMAL LOW (ref 98–111)
Creatinine, Ser: 0.65 mg/dL (ref 0.61–1.24)
GFR, Estimated: >60 mL/min (ref >60)
Glucose, Bld: 104 mg/dL — ABNORMAL HIGH (ref 70–99)
Potassium: 3.9 mmol/L (ref 3.5–5.1)
Sodium: 136 mmol/L (ref 135–145)

## 2020-06-13 LAB — MAGNESIUM: Magnesium: 1.4 mg/dL — ABNORMAL LOW (ref 1.7–2.4)

## 2020-06-13 LAB — CBC
HCT: 37.8 % — ABNORMAL LOW (ref 39.0–52.0)
Hemoglobin: 11.4 g/dL — ABNORMAL LOW (ref 13.0–17.0)
MCH: 31.8 pg (ref 26.0–34.0)
MCHC: 30.2 g/dL (ref 30.0–36.0)
MCV: 105.6 fL — ABNORMAL HIGH (ref 80.0–100.0)
Platelets: 133 10*3/uL — ABNORMAL LOW (ref 150–400)
RBC: 3.58 MIL/uL — ABNORMAL LOW (ref 4.22–5.81)
RDW: 16.4 % — ABNORMAL HIGH (ref 11.5–15.5)
WBC: 5.2 10*3/uL (ref 4.0–10.5)
nRBC: 0 % (ref 0.0–0.2)

## 2020-06-13 LAB — PHOSPHORUS: Phosphorus: 2.7 mg/dL (ref 2.5–4.6)

## 2020-06-13 MED ORDER — LORAZEPAM 2 MG/ML IJ SOLN
1.0000 mg | INTRAMUSCULAR | Status: AC | PRN
  Administered 2020-06-13: 2 mg via INTRAVENOUS
  Filled 2020-06-13: qty 1

## 2020-06-13 MED ORDER — LORAZEPAM 2 MG/ML IJ SOLN: 0.0000 mg | INTRAMUSCULAR | Status: AC

## 2020-06-13 MED ORDER — LORAZEPAM 1 MG PO TABS: 1.0000 mg | ORAL_TABLET | ORAL | Status: AC | PRN

## 2020-06-13 MED ORDER — MAGNESIUM SULFATE 2 GM/50ML IV SOLN
2.0000 g | Freq: Once | INTRAVENOUS | Status: AC
  Administered 2020-06-13: 2 g via INTRAVENOUS
  Filled 2020-06-13: qty 50

## 2020-06-13 MED ORDER — LORAZEPAM 2 MG/ML IJ SOLN
0.0000 mg | Freq: Three times a day (TID) | INTRAMUSCULAR | Status: AC
  Filled 2020-06-13: qty 2
  Filled 2020-06-13: qty 1
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
## 2020-06-13 NOTE — Progress Notes (Signed)
APS came to see patient. Social Worker [place] number for office is [phone] and cell is [phone]. They would like to be updated if patient is going to be discharged and patient is wanting to go home rather than to SNF or assisted living.
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
## 2020-06-13 NOTE — Progress Notes (Addendum)
PROGRESS NOTE    [name] [name]  SLH:[id number] DOB: 1960-11-10 DOA: 06/08/2020 PCP: [name] Cords, DO   Brief Narrative:  59 y.o. 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  He denies head trauma and loss of consciousness.  He endorses abdominal pain.  He told EMS that he did not want to go to the hospital and that he just wants to be left alone to die.  At bedside, he denies fever, chest pain, changes to his short baseline shortness of breath.  She denies dysuria and hematuria.  He endorses nausea, and denies vomiting.  He endorses to me that he drank alcohol 4 to 5 hours prior to presentation to the emergency department.  Denies SI and HI.  Presentation consistent with acute on chronic respiratory failure likely secondary to COPD/CHF.  Remains stepdown status.  Was on BiPAP since admission, taken off on 4/25.  Tolerating nasal cannula okay but increased work of breathing.  4/26: Patient apparently began to exhibit some withdrawal symptoms today.  Seen by palliative care.  Patient's birthday is in July and he wishes to live long enough to see his birthday.  He confirms DNR/DNI status but would like to treat the treatable.  He endorses some shoulder pain as well   Assessment & Plan:   Active Problems:   Centrilobular emphysema (HCC)   Sepsis (HCC)   Chronic atrial fibrillation (HCC)   HTN (hypertension)   Chronic gout  involving toe, unspecified cause, unspecified laterality   TBI (traumatic brain injury) (HCC)   Personal history of DVT (deep vein thrombosis)   Atrial fibrillation with RVR (HCC)   Major depressive disorder, recurrent episode, in partial remission (HCC)   HLD (hyperlipidemia)   Depression   Pressure ulcer   Alcohol abuse   Alcohol withdrawal syndrome without complication (HCC)   GAD (generalized anxiety disorder)   COPD (chronic obstructive pulmonary disease) (HCC)   Atrial fibrillation, chronic (HCC)   Class 3 obesity with alveolar hypoventilation, serious comorbidity, and body mass index (BMI) of 60.0 to 69.9 in adult [facility])   CHF (congestive heart failure) (HCC)   Weakness of both lower extremities   Physical deconditioning   Elevated lactic acid level   Unable to care for self   Aerococcus bacteriuria Typically a contaminant but pt endorses dysuria. Discussed w/ pharm, nitrofurantoin typically covers - continue macrobid (4/27> ), plan for 5 day course  Acute on chronic respiratory failure HFrEF EF 30-35 earlier this year. Requiring intermittent bipap. Currently on [place] O2. Obesity contributory. his morning awake and alert. On 2-3 L chronically. Appears relatively euvolemic - BiPAP as needed -  Continue O2. - have increased furosemide from 40 to 60 IV bid  Atrial fibrillation with rapid ventricular response Multifactorial Alcohol withdrawal versus acute intoxication Has been weaned of diltiazem gtt and hr now appropriate - transfer to progressive bed - have increased metop to 100 bid - cont eliquis  Alcohol use, suspect intoxication Alcohol withdrawal CIWA protocol initiated Patient began to exhibit withdrawal symptoms on 4/26 Patient drinks half gallon of whiskey a day No signs  withdrawal this morning - continue ciwa for now  Right sixth rib fracture Acute to subacute Symptomatic management - oxy prn for pain  Hypertension Controlled - cont lisinopril and  metoprolol  Hypomagnesemia Mg 1.4. k wnl - mg 2 g ordered.  Thrombocytopenia Mild, 137>133 - monitor for now  Depression Duloxetine 20 mg daily  Hypothyroid tsh elevated, home compliance unclear - will need repeat tsh in 4-6 wks  OSA/OHS Nightly BiPAP  Morbid obesity This complicates overall care and prognosis  Multiple medical comorbidities Failure to thrive Open aps case. Palliative involved, pt wants to treat the treatable. sw following. APS is involved. APS WANTS PATIENT KEPT HERE FOR TIME BEING.. Patient is at very high risk for readmission and he has demonstrated that he is unable to safely care for himself at home. It is my opinion that he would strongly benefit from skilled nursing care.   DVT prophylaxis: Eliquis Code Status: DNR Family Communication: sister [name] [name] updated telephonically 4/28 Disposition Plan: Status is: Inpatient  Remains inpatient appropriate because:Inpatient level of care appropriate due to severity of illness   Dispo: The patient is from: Home              Anticipated d/c is to: snf vs home. Pt reluctantly agreed to snf on 4/28, will discuss w/ care mgr to research snf and home health options (apparently has "burned bridges" with home health in the past so may be difficult to get that arranged)              Patient currently is not medically stable to d/c.   Difficult to place patient No  . Level of care: Progressive Cardiac  Consultants:   None   Procedures: None  Antimicrobials:   Nitrofurantoin 4/27>   Subjective: Patient seen and examined.  Awake and alert. Says breathing stable. No chest pain. Having regular BMs  Objective: Vitals:   06/13/20 0500 06/13/20 0600 06/13/20 0700 06/13/20 0815  BP: 124/78 111/77 112/81 (!) 107/97  Pulse: (!) 40 76 (!) 52   Resp: 20 18 16 16   Temp:    97.8 F (36.6 C)  TempSrc:      SpO2: (!) 83% 95% 100% 100%  Weight:      Height:        Intake/Output Summary (Last 24 hours) at  06/13/2020 1102 Last data filed at 06/13/2020 0800 Gross per 24 hour  Intake 276.7 ml  Output 1100 ml  Net -823.3 ml   Filed Weights   06/08/20 0913 06/08/20 2045  Weight: (!) 186 kg (!) 178.9 kg    Examination:  General exam: NAD Respiratory system: mild tachypnea, scattered crackles,  Cardiovascular system: Tachycardic, irregular rhythm, no murmurs Gastrointestinal system: Obese, nontender, nondistended, normal bowel sounds Central nervous system: Alert and oriented, lethargic, no focal deficits  extremities: Diffusely decreased power bilaterally. Skin: Dry skin, scattered excoriations. 2+ pitting LE edema Psychiatry: Judgement and insight appear impaired. Mood & affect flattened.     Data Reviewed: I have personally reviewed following labs and imaging studies  CBC: Recent Labs  Lab 06/08/20 0919 06/09/20 0053 06/12/20 0437 06/13/20 0550  WBC 8.5 9.7 7.1 5.2  NEUTROABS 6.0  --   --   --   HGB 12.9* 12.3* 11.8* 11.4*  HCT 38.6* 37.7* 38.9* 37.8*  MCV 96.0 97.4 106.0* 105.6*  PLT 225 161 137* 133*   Basic Metabolic Panel: Recent Labs  Lab 06/09/20 0053 06/10/20 0442 06/11/20 0357 06/12/20 0437 06/13/20 0550  NA 136 135  135 139 136  K 3.4* 3.3* 4.1 4.4 3.9  CL 90* 88* 86* 87* 82*  CO2 31 37* 36* 41* 43*  GLUCOSE 162* 142* 115* 106* 104*  BUN 11 12 15 17 17   CREATININE 0.52* 0.56* 0.72 0.66 0.65  CALCIUM 8.1* 8.3* 8.7* 8.8* 8.7*  MG 1.3* 1.5* 1.8 1.8 1.4*  PHOS 2.4* 1.8* 2.9 3.1 2.7   GFR: Estimated Creatinine Clearance: 158.3 mL/min (by C-G formula based on SCr of 0.65 mg/dL). Liver Function Tests: Recent Labs  Lab 06/08/20 0919  AST 25  ALT 12  ALKPHOS 82  BILITOT 0.7  PROT 7.4  ALBUMIN 3.6   No results for input(s): LIPASE, AMYLASE in the last 168 hours. No results for input(s): AMMONIA in the last 168 hours. Coagulation Profile: Recent Labs  Lab 06/08/20 0919 06/09/20 0053  INR 1.0 1.1   Cardiac Enzymes: No results for input(s):  CKTOTAL, CKMB, CKMBINDEX, TROPONINI in the last 168 hours. BNP (last 3 results) No results for input(s): PROBNP in the last 8760 hours. HbA1C: No results for input(s): HGBA1C in the last 72 hours. CBG: Recent Labs  Lab 06/08/20 2045 06/12/20 1147  GLUCAP 154* 127*   Lipid Profile: No results for input(s): CHOL, HDL, LDLCALC, TRIG, CHOLHDL, LDLDIRECT in the last 72 hours. Thyroid Function Tests: Recent Labs    06/12/20 0437  TSH 11.443*   Anemia Panel: No results for input(s): VITAMINB12, FOLATE, FERRITIN, TIBC, IRON, RETICCTPCT in the last 72 hours. Sepsis Labs: Recent Labs  Lab 06/08/20 1538 06/08/20 2045 06/09/20 0053 06/09/20 0834 06/09/20 1131  PROCALCITON  --   --  <0.10  --   --   LATICACIDVEN 4.5* 3.9*  --  1.6 1.6    Recent Results (from the past 240 hour(s))  Blood culture (routine single)     Status: None   Collection Time: 06/08/20  9:19 AM   Specimen: BLOOD  Result Value Ref Range Status   Specimen Description [facility]  Final   Special Requests   Final    BOTTLES DRAWN AEROBIC AND ANAEROBIC Blood Culture results may not be optimal due to an inadequate volume of blood received in culture bottles   Culture   Final    NO GROWTH 5 DAYS Performed at [facility], [address] [place], [place] [address]    Report Status 06/13/2020 FINAL  Final  Urine culture     Status: Abnormal   Collection Time: 06/08/20  9:19 AM   Specimen: Urine, Random  Result Value Ref Range Status   Specimen Description   Final    URINE, RANDOM Performed at [facility], [address] [place], [place] [address]    Special Requests   Final    NONE Performed at [facility], [address] [place], [place] [address]    Culture (A)  Final    >=100,000 COLONIES/mL AEROCOCCUS URINAE 20,000 COLONIES/mL ENTEROCOCCUS FAECALIS Standardized susceptibility testing for this organism is not available. Performed at [facility], [address] [place], [place] [address]    Report Status 06/11/2020 FINAL  Final   Organism ID, Bacteria ENTEROCOCCUS FAECALIS (A)  Final      Susceptibility   Enterococcus faecalis - MIC*    AMPICILLIN <=2 SENSITIVE Sensitive     NITROFURANTOIN <=16 SENSITIVE Sensitive     VANCOMYCIN 1 SENSITIVE Sensitive     * 20,000 COLONIES/mL ENTEROCOCCUS FAECALIS  Resp Panel by RT-PCR (Flu A&B, Covid) Nasopharyngeal Swab  Status: None   Collection Time: 06/08/20  9:22 AM   Specimen: Nasopharyngeal Swab; Nasopharyngeal(NP) swabs in vial transport medium  Result Value Ref Range Status   SARS Coronavirus 2 by RT PCR NEGATIVE NEGATIVE Final    Comment: (NOTE) SARS-CoV-2 target nucleic acids are NOT DETECTED.  The SARS-CoV-2 RNA is generally detectable in upper respiratory specimens during the acute phase of infection. The lowest concentration of SARS-CoV-2 viral copies this assay can detect is 138 copies/mL. A negative result does not preclude SARS-Cov-2 infection and should not be used as the sole basis for treatment or other patient management decisions. A negative result may occur with  improper specimen collection/handling, submission of specimen other than nasopharyngeal swab, presence of viral mutation(s) within the areas targeted by this assay, and inadequate number of viral copies(<138 copies/mL). A negative result must be combined with clinical observations, patient history, and epidemiological information. The expected result is Negative.  Fact Sheet for Patients:  BloggerCourse.com  Fact Sheet for Healthcare Providers:  SeriousBroker.it  This test is no t yet approved or cleared by the [place] FDA and  has been authorized for detection and/or diagnosis of SARS-CoV-2 by FDA under an Emergency Use Authorization (EUA). This EUA will remain  in effect (meaning this test can be used) for the duration of the COVID-19 declaration under  Section 564(b)(1) of the Act, 21 U.S.C.section 360bbb-3(b)(1), unless the authorization is terminated  or revoked sooner.       Influenza A by PCR NEGATIVE NEGATIVE Final   Influenza B by PCR NEGATIVE NEGATIVE Final    Comment: (NOTE) The Xpert Xpress SARS-CoV-2/FLU/RSV plus assay is intended as an aid in the diagnosis of influenza from Nasopharyngeal swab specimens and should not be used as a sole basis for treatment. Nasal washings and aspirates are unacceptable for Xpert Xpress SARS-CoV-2/FLU/RSV testing.  Fact Sheet for Patients: BloggerCourse.com  Fact Sheet for Healthcare Providers: SeriousBroker.it  This test is not yet approved or cleared by the [place] FDA and has been authorized for detection and/or diagnosis of SARS-CoV-2 by FDA under an Emergency Use Authorization (EUA). This EUA will remain in effect (meaning this test can be used) for the duration of the COVID-19 declaration under Section 564(b)(1) of the Act, 21 U.S.C. section 360bbb-3(b)(1), unless the authorization is terminated or revoked.  Performed at [facility], [address] [place], [place] [address]   Culture, blood (single)     Status: None   Collection Time: 06/08/20  1:51 PM   Specimen: BLOOD  Result Value Ref Range Status   Specimen Description BLOOD LEFT ARM  Final   Special Requests   Final    BOTTLES DRAWN AEROBIC AND ANAEROBIC Blood Culture results may not be optimal due to an excessive volume of blood received in culture bottles   Culture   Final    NO GROWTH 5 DAYS Performed at [facility], [address] [place], [place] [address]    Report Status 06/13/2020 FINAL  Final  MRSA PCR Screening     Status: Abnormal   Collection Time: 06/08/20  8:42 PM   Specimen: Nasopharyngeal  Result Value Ref Range Status   MRSA by PCR POSITIVE (A) NEGATIVE Final    Comment:        The GeneXpert MRSA Assay (FDA approved  for NASAL specimens only), is one component of a comprehensive MRSA colonization surveillance program. It is not intended to diagnose MRSA infection nor to guide or monitor treatment for MRSA  infections. RESULT CALLED TO, READ BACK BY AND VERIFIED WITH: NOTIFIED Reynolds Bowl RN 06/08/2020 2311 JG Performed at [facility], [address] [place], [place] [address]          Radiology Studies: No results found.      Scheduled Meds: . apixaban  5 mg Oral BID  . aspirin  81 mg Oral Daily  . atorvastatin  40 mg Oral Daily  . Chlorhexidine Gluconate Cloth  6 each Topical Daily  . DULoxetine  20 mg Oral Daily  . folic acid  1 mg Oral Daily  . furosemide  60 mg Intravenous Q12H  . gabapentin  300 mg Oral TID  . levothyroxine  75 mcg Oral QAC breakfast  . lisinopril  2.5 mg Oral Daily  . LORazepam  0-4 mg Intravenous Q4H   Followed by  . LORazepam  0-4 mg Intravenous Q8H  . methocarbamol  750 mg Oral TID  . metoprolol tartrate  100 mg Oral BID  . montelukast  10 mg Oral QHS  . multivitamin with minerals  1 tablet Oral Daily  . mupirocin ointment  1 application Nasal BID  . nitrofurantoin (macrocrystal-monohydrate)  100 mg Oral Q12H  . polyethylene glycol  17 g Oral Daily  . Ensure Max Protein  11 oz Oral TID WC  . thiamine  100 mg Oral Daily   Or  . thiamine  100 mg Intravenous Daily   Continuous Infusions:    LOS: 5 days    Time spent: 25 minutes    Silvano Bilis, MD [facility]  If 7PM-7AM, please contact night-coverage 06/13/2020, 11:02 AM

## 2020-06-13 NOTE — Progress Notes (Signed)
   Daily Progress Note   Patient Name: [name] [name]       Date: 06/13/2020 DOB: [birth year]  Age: 60 y.o. MRN#: [id number] Attending Physician: Kathrynn Running, MD Primary Care Physician: [name], DO Admit Date: 06/08/2020  Reason for Consultation/Follow-up: Establishing goals of care  Subjective:  Chart reviewed.   Patient awake and alert. Sitting upright in bed on 5L/[place]. No acute distress noted. Is alert and oriented x4. Complains of some shoulder discomfort. States he feels much better today. Denies shortness of breath.   No family is at the bedside.   Patient was previously seen by colleague, Aggie Cosier, NP. I reviewed goals of care discussions. He verbalizes understanding. We discussed at length his refusal to wear BiPAP. He states he does not like wearing, it makes him feel closed in and the pressure is to much for him. Education provided on his respiratory status, disease trajectory, and goals. He verbalized understanding.   [name] is clear in expressed wishes for DNR/DNI. He states he would like to continue to treat the treatable with a goal of eventually returning home. I openly shared concerns with him returning home alone and his safety. He verbalized understanding and states he would be open to SNF allowing him every opportunity to thrive.   He understands poor long-term prognosis and states he is hopeful to see his birthday in July however, if his health was to further decline significantly he would not wish to suffer. He does not feel he is currently suffering or at that state of health. He acknowledges if he was unable to make decisions or declined his sister, [name] [name] would be his surrogate decision maker with support of [name] who is his sister-n-law and close friend.   All questions answered and support provided.   Length of Stay: 5 days  Vital Signs: BP (!) 146/102   Pulse 99   Temp 97.8 F (36.6 C)   Resp 14   Ht 5\' 8"  (1.727 m)   Wt (!)  178.9 kg   SpO2 100%   BMI 59.97 kg/m  SpO2: SpO2: 100 % O2 Device: O2 Device: Bi-PAP O2 Flow Rate: O2 Flow Rate (L/min): 5 L/min  Physical Exam: NAD, obese, chronically ill Tachycardic Rhonchi AAOx3, mood appropriate, follows commands              Palliative Care Assessment & Plan  Goals of Care/Recommendations: DNR/DNI Continue to treat the treatable. Patient clear in expressed goals and his hopeful to see his 60th birthday in July, however if further declined, no meaningful recovery would not wish to suffer.  PMT will continue to support and follow.    Prognosis: Guarded   Discharge Planning: To Be Determined is open to SNF   Thank you for allowing the Palliative Medicine Team to assist in the care of this patient.  Time Total: 35 min.   Visit consisted of counseling and education dealing with the complex and emotionally intense issues of symptom management and palliative care in the setting of serious and potentially life-threatening illness.Greater than 50%  of this time was spent counseling and coordinating care related to the above assessment and plan.  August, AGPCNP-BC  Palliative Medicine Team [phone]

## 2020-06-13 NOTE — Progress Notes (Addendum)
Physical Therapy Treatment Patient Details Name: [name] [name] MRN: [id number] DOB: 05-05-1960 Today's Date: 06/13/2020    History of Present Illness Pt is a 60 y.o. male with medical history significant for morbid obesity, chronic hypoxic respiratory failure, obesity hypoventilation syndrome, hypertension, depression, anxiety, alcohol use disorder, Left ear deafness since the age of 35, COPD, history of nonischemic cardiomyopathy, heart failure reduced ejection fraction, persistent atrial fibrillation, sleep apnea, history of DVT, GERD, hypothyroid, hyperlipidemia, history of bilateral segmental and subsegmental pulmonary emboli, chronic bilateral lower extremity venous stasis, presents to the emergency department for chief concerns of having a fall.  MD assessment includes: SIRS, acute on chronic respiratory failure, A-fib with RVR, alcohol withdrawal, R 6th rib Fx, and FTT.    PT Comments    Pt was supine in bed with HOB elevated ~ 30 degrees upon arriving. He was asleep but easily awakes. Pt is well known by author from previous admissions. He required 5 L HFNC throughout session and was only able to tolerate sitting EOB for ~ 10-15 seconds due to SOB/labor/WOB. Max assist to exit and return. Recommend +2 assistance for next PT/OT session and use of mechanical lift for RN staff if pt needs OOB. Pt is extremely deconditioned. He was able to perform and tolerate there ex in bed prior to conclusion of session.Pt was repositioned in bed at conclusion of session with call bell in reach and RN aware of pt's abilities. Pt will need extensive PT going forward to return to PLOF. Highly recommend SNF/rehab at DC.    Follow Up Recommendations  SNF;Supervision/Assistance - 24 hour     Equipment Recommendations  Rolling walker with 5" wheels (Bariatric RW)       Precautions / Restrictions Precautions Precautions: Fall Restrictions Weight Bearing Restrictions: No    Mobility  Bed Mobility Overal  bed mobility: Needs Assistance Bed Mobility: Rolling Rolling: Max assist   Supine to sit: Max assist;HOB elevated Sit to supine: Max assist   General bed mobility comments: Pt was able to acheiev EOB short sit with max A of one. recommend +2 assistance for future OOB activity. pt becomes very SOB/labored breathign upon sitting up. only tolerated EOB sitting x ~10-15 sec prior to returning to supine. Took several minutes to recover from very minimal activity    Transfers    General transfer comment: unsafe at this time      Balance Overall balance assessment: Needs assistance Sitting-balance support: Bilateral upper extremity supported Sitting balance-Leahy Scale: Poor Sitting balance - Comments: difficult to assess due to limited tolerance seated EOB       Cognition Arousal/Alertness: Lethargic Behavior During Therapy: WFL for tasks assessed/performed Overall Cognitive Status: Within Functional Limits for tasks assessed      General Comments: Pt was oriented x 2. likes to use humor to cover cognitive deficits.      Exercises Total Joint Exercises Ankle Circles/Pumps: AROM;Strengthening;Both;10 reps Quad Sets: Strengthening;Both;10 reps Straight Leg Raises: AAROM;Strengthening;Both;10 reps        Pertinent Vitals/Pain Pain Assessment: No/denies pain           PT Goals (current goals can now be found in the care plan section) Acute Rehab PT Goals Patient Stated Goal: none stated Progress towards PT goals: Progressing toward goals    Frequency    Min 2X/week      PT Plan Current plan remains appropriate       AM-PAC PT "6 Clicks" Mobility   Outcome Measure  Help needed turning from your back  to your side while in a flat bed without using bedrails?: A Lot Help needed moving from lying on your back to sitting on the side of a flat bed without using bedrails?: A Lot Help needed moving to and from a bed to a chair (including a wheelchair)?: Total Help  needed standing up from a chair using your arms (e.g., wheelchair or bedside chair)?: Total Help needed to walk in hospital room?: Total Help needed climbing 3-5 steps with a railing? : Total 6 Click Score: 8    End of Session Equipment Utilized During Treatment: Oxygen Activity Tolerance: Patient limited by fatigue;Patient limited by lethargy Patient left: in bed;with call bell/phone within reach;with nursing/sitter in room Nurse Communication: Mobility status PT Visit Diagnosis: Muscle weakness (generalized) (M62.81);Difficulty in walking, not elsewhere classified (R26.2)     Time: 1607-3710 PT Time Calculation (min) (ACUTE ONLY): 19 min  Charges:  $Therapeutic Activity: 8-22 mins                     Jetta Lout PTA 06/13/20, 11:09 AM

## 2020-06-13 NOTE — Progress Notes (Signed)
Confused and taking off bipap. Alert to person only and agitated and restless continuing to ask where he is and why he is here. Attempts made to re-orient have failed and continues removing bipap and restless/anxious behaviours. Notified MD on call. Awaiting response. At bedside continuing to monitor.

## 2020-06-13 NOTE — Progress Notes (Signed)
Patient alert, at this time no complaints of pain. Patient does become shortness of breath with exertions. Patient incontinent, cleaned. Replaced magnesium. Report given to [name]. APS- Mrs. Faision would like to be called before patient is discharged. [phone] cell [phone].
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
## 2020-06-14 DIAGNOSIS — I4891 Unspecified atrial fibrillation: Secondary | ICD-10-CM | POA: Diagnosis not present

## 2020-06-14 LAB — MAGNESIUM: Magnesium: 1.4 mg/dL — ABNORMAL LOW (ref 1.7–2.4)

## 2020-06-14 LAB — BASIC METABOLIC PANEL
Anion gap: 10 (ref 5–15)
BUN: 13 mg/dL (ref 6–20)
CO2: 49 mmol/L — ABNORMAL HIGH (ref 22–32)
Calcium: 9 mg/dL (ref 8.9–10.3)
Chloride: 76 mmol/L — ABNORMAL LOW (ref 98–111)
Creatinine, Ser: 0.58 mg/dL — ABNORMAL LOW (ref 0.61–1.24)
GFR, Estimated: >60 mL/min (ref >60)
Glucose, Bld: 134 mg/dL — ABNORMAL HIGH (ref 70–99)
Potassium: 3.8 mmol/L (ref 3.5–5.1)
Sodium: 135 mmol/L (ref 135–145)

## 2020-06-14 LAB — CBC
HCT: 37.5 % — ABNORMAL LOW (ref 39.0–52.0)
Hemoglobin: 11.6 g/dL — ABNORMAL LOW (ref 13.0–17.0)
MCH: 32 pg (ref 26.0–34.0)
MCHC: 30.9 g/dL (ref 30.0–36.0)
MCV: 103.6 fL — ABNORMAL HIGH (ref 80.0–100.0)
Platelets: 149 10*3/uL — ABNORMAL LOW (ref 150–400)
RBC: 3.62 MIL/uL — ABNORMAL LOW (ref 4.22–5.81)
RDW: 16.1 % — ABNORMAL HIGH (ref 11.5–15.5)
WBC: 6.4 10*3/uL (ref 4.0–10.5)
nRBC: 0 % (ref 0.0–0.2)

## 2020-06-14 LAB — PHOSPHORUS: Phosphorus: 3.2 mg/dL (ref 2.5–4.6)

## 2020-06-14 MED ORDER — MAGNESIUM SULFATE 4 GM/100ML IV SOLN
4.0000 g | Freq: Once | INTRAVENOUS | Status: AC
  Administered 2020-06-14: 4 g via INTRAVENOUS
  Filled 2020-06-14: qty 100

## 2020-06-14 MED ORDER — LORAZEPAM 2 MG/ML IJ SOLN
0.0000 mg | Freq: Three times a day (TID) | INTRAMUSCULAR | Status: AC
  Administered 2020-06-14: 2 mg via INTRAVENOUS
  Administered 2020-06-14: 4 mg via INTRAVENOUS
  Filled 2020-06-14: qty 1

## 2020-06-14 MED ORDER — FUROSEMIDE 10 MG/ML IJ SOLN: 60.0000 mg | Freq: Every day | INTRAMUSCULAR | Status: DC

## 2020-06-14 MED ORDER — LORAZEPAM 2 MG/ML IJ SOLN
2.0000 mg | Freq: Four times a day (QID) | INTRAMUSCULAR | Status: DC | PRN
  Administered 2020-06-14 – 2020-06-21 (×2): 2 mg via INTRAVENOUS
  Filled 2020-06-14 (×2): qty 1

## 2020-06-14 NOTE — Progress Notes (Signed)
PROGRESS NOTE    [name] MajesticKenneth [name]  WUJ:[id number] DOB: 1960/10/24 DOA: 06/08/2020 PCP: Smitty CordsKaramalegos, Alexander J, DO   Brief Narrative:  60 y.o. 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  He denies head trauma and loss of consciousness.  He endorses abdominal pain.  He told EMS that he did not want to go to the hospital and that he just wants to be left alone to die.  At bedside, he denies fever, chest pain, changes to his short baseline shortness of breath.  She denies dysuria and hematuria.  He endorses nausea, and denies vomiting.  He endorses to me that he drank alcohol 4 to 5 hours prior to presentation to the emergency department.  Denies SI and HI.  Presentation consistent with acute on chronic respiratory failure likely secondary to COPD/CHF.  Remains stepdown status.  Was on BiPAP since admission, taken off on 4/25.  Tolerating nasal cannula okay but increased work of breathing.  4/26: Patient apparently began to exhibit some withdrawal symptoms today.  Seen by palliative care.  Patient's birthday is in [birth year] and he wishes to live long enough to see his birthday.  He confirms DNR/DNI status but would like to treat the treatable.  He endorses some shoulder pain as well   Assessment & Plan:   Active Problems:   Centrilobular emphysema (HCC)   Sepsis (HCC)   Chronic atrial fibrillation (HCC)   HTN (hypertension)   Chronic gout  involving toe, unspecified cause, unspecified laterality   TBI (traumatic brain injury) (HCC)   Personal history of DVT (deep vein thrombosis)   Atrial fibrillation with RVR (HCC)   Major depressive disorder, recurrent episode, in partial remission (HCC)   HLD (hyperlipidemia)   Depression   Pressure ulcer   Alcohol abuse   Alcohol withdrawal syndrome without complication (HCC)   GAD (generalized anxiety disorder)   COPD (chronic obstructive pulmonary disease) (HCC)   Atrial fibrillation, chronic (HCC)   Class 3 obesity with alveolar hypoventilation, serious comorbidity, and body mass index (BMI) of 60.0 to 69.9 in adult [facility](HCC)   CHF (congestive heart failure) (HCC)   Weakness of both lower extremities   Physical deconditioning   Elevated lactic acid level   Unable to care for self   Aerococcus bacteriuria Typically a contaminant but pt endorses dysuria. Discussed w/ pharm, nitrofurantoin typically covers - continue macrobid (4/27> ), plan for 5 day course  Acute on chronic respiratory failure HFrEF EF 30-35 earlier this year. Requiring intermittent bipap. Currently on [place] O2. Obesity contributory. this morning awake and alert. On 2-3 L chronically, is on that, acute respiratory failure is resolved. Appears relatively euvolemic - BiPAP as needed - Continue O2. - have increased furosemide from 40 to 60 IV bid  Atrial fibrillation with rapid ventricular response Multifactorial Alcohol withdrawal versus acute intoxication Has been weaned of diltiazem gtt and hr now appropriate - have increased metop to 100 bid - cont eliquis  Alcohol use, suspect intoxication Patient drinks half gallon of whiskey a day No signs withdrawal this morning - ativan prn  Right sixth rib fracture  Acute to subacute. Not complaining of pain - oxy prn for pain  Hypertension Controlled - cont lisinopril and metoprolol  Hypomagnesemia Mg 1.4. k wnl - mg 4 g ordered.  Thrombocytopenia Mild,  [phone] - monitor for now  Macrocytosis Likely 2/2 etoh - will check folate and b12  Depression Duloxetine 20 mg daily  Hypothyroid tsh elevated, home compliance unclear - will need repeat tsh in 4-6 wks  OSA/OHS Nightly BiPAP  Morbid obesity This complicates overall care and prognosis  Multiple medical comorbidities Failure to thrive Open aps case. Palliative involved, pt wants to treat the treatable. sw following. APS is involved and is pursuing guardianship. APS WANTS PATIENT KEPT HERE FOR TIME BEING.. Patient is at very high risk for readmission and he has demonstrated that he is unable to safely care for himself at home. It is my opinion that he would strongly benefit from skilled nursing care.   DVT prophylaxis: Eliquis Code Status: DNR Family Communication: sister [name] updated telephonically 4/28 Disposition Plan: Status is: Inpatient  Remains inpatient appropriate because:Inpatient level of care appropriate due to severity of illness   Dispo: The patient is from: Home              Anticipated d/c is to: snf                Patient currently is not medically stable to d/c.   Difficult to place patient No  . Level of care: Progressive Cardiac  Consultants:   None   Procedures: None  Antimicrobials:   Nitrofurantoin 4/27>   Subjective: Patient seen and examined.  Awake and alert. Says breathing stable. No chest pain. Is out of bed in chair  Objective: Vitals:   06/13/20 2002 06/14/20 0653 06/14/20 0808 06/14/20 1130  BP: (!) 125/96 120/68 117/80 (!) 149/108  Pulse: 87 82 86 95  Resp: 18  18 19   Temp: 97.7 F (36.5 C) 98.6 F (37 C) 97.6 F (36.4 C) 97.6 F (36.4 C)  TempSrc: Oral Oral    SpO2: 100% 99% 99% 94%  Weight:  (!) 180.1 kg    Height:        Intake/Output Summary (Last 24 hours) at 06/14/2020 1327 Last data filed at 06/14/2020 1130 Gross per 24 hour  Intake 46.56 ml  Output 1300 ml  Net -1253.44 ml   Filed Weights    06/08/20 0913 06/08/20 2045 06/14/20 0653  Weight: (!) 186 kg (!) 178.9 kg (!) 180.1 kg    Examination:  General exam: NAD Respiratory system: rr, scattered crackles,  Cardiovascular system:  irregular rhythm, no murmurs Gastrointestinal system: Obese, nontender, nondistended, normal bowel sounds Central nervous system: Alert and oriented, lethargic, no focal deficits  extremities: Diffusely decreased power bilaterally. Skin: Dry skin, scattered excoriations. 1+ pitting LE edema Psychiatry: Judgement and insight appear impaired. Mood & affect flattened.     Data Reviewed: I have personally reviewed following labs and imaging studies  CBC: Recent Labs  Lab 06/08/20 0919 06/09/20 0053 06/12/20 0437 06/13/20 0550 06/14/20 0704  WBC 8.5 9.7 7.1 5.2 6.4  NEUTROABS 6.0  --   --   --   --   HGB 12.9* 12.3* 11.8* 11.4* 11.6*  HCT 38.6* 37.7* 38.9* 37.8* 37.5*  MCV 96.0 97.4 106.0* 105.6* 103.6*  PLT 225 161 137* 133* 149*   Basic Metabolic Panel: Recent Labs  Lab 06/10/20 0442 06/11/20 0357 06/12/20 0437 06/13/20 0550 06/14/20 0704  NA 135 135 139 136 135  K 3.3* 4.1  4.4 3.9 3.8  CL 88* 86* 87* 82* 76*  CO2 37* 36* 41* 43* 49*  GLUCOSE 142* 115* 106* 104* 134*  BUN 12 15 17 17 13   CREATININE 0.56* 0.72 0.66 0.65 0.58*  CALCIUM 8.3* 8.7* 8.8* 8.7* 9.0  MG 1.5* 1.8 1.8 1.4* 1.4*  PHOS 1.8* 2.9 3.1 2.7 3.2   GFR: Estimated Creatinine Clearance: 159 mL/min (A) (by C-G formula based on SCr of 0.58 mg/dL (L)). Liver Function Tests: Recent Labs  Lab 06/08/20 0919  AST 25  ALT 12  ALKPHOS 82  BILITOT 0.7  PROT 7.4  ALBUMIN 3.6   No results for input(s): LIPASE, AMYLASE in the last 168 hours. No results for input(s): AMMONIA in the last 168 hours. Coagulation Profile: Recent Labs  Lab 06/08/20 0919 06/09/20 0053  INR 1.0 1.1   Cardiac Enzymes: No results for input(s): CKTOTAL, CKMB, CKMBINDEX, TROPONINI in the last 168 hours. BNP (last 3 results) No  results for input(s): PROBNP in the last 8760 hours. HbA1C: No results for input(s): HGBA1C in the last 72 hours. CBG: Recent Labs  Lab 06/08/20 2045 06/12/20 1147  GLUCAP 154* 127*   Lipid Profile: No results for input(s): CHOL, HDL, LDLCALC, TRIG, CHOLHDL, LDLDIRECT in the last 72 hours. Thyroid Function Tests: Recent Labs    06/12/20 0437  TSH 11.443*   Anemia Panel: No results for input(s): VITAMINB12, FOLATE, FERRITIN, TIBC, IRON, RETICCTPCT in the last 72 hours. Sepsis Labs: Recent Labs  Lab 06/08/20 1538 06/08/20 2045 06/09/20 0053 06/09/20 0834 06/09/20 1131  PROCALCITON  --   --  <0.10  --   --   LATICACIDVEN 4.5* 3.9*  --  1.6 1.6    Recent Results (from the past 240 hour(s))  Blood culture (routine single)     Status: None   Collection Time: 06/08/20  9:19 AM   Specimen: BLOOD  Result Value Ref Range Status   Specimen Description BLOOD RIGHT [facility]  Final   Special Requests   Final    BOTTLES DRAWN AEROBIC AND ANAEROBIC Blood Culture results may not be optimal due to an inadequate volume of blood received in culture bottles   Culture   Final    NO GROWTH 5 DAYS Performed at [facility], [address] [place], [place]    Report Status 06/13/2020 FINAL  Final  Urine culture     Status: Abnormal   Collection Time: 06/08/20  9:19 AM   Specimen: Urine, Random  Result Value Ref Range Status   Specimen Description   Final    URINE, RANDOM Performed at [facility], [address], [place], [place]    Special Requests   Final    NONE Performed at [facility], [address], [place], [place]    Culture (A)  Final    >=100,000 COLONIES/mL AEROCOCCUS URINAE 20,000 COLONIES/mL ENTEROCOCCUS FAECALIS Standardized susceptibility testing for this organism is not available. Performed at [facility], [address] [place], [place]    Report Status 06/11/2020 FINAL  Final   Organism  ID, Bacteria ENTEROCOCCUS FAECALIS (A)  Final      Susceptibility   Enterococcus faecalis - MIC*    AMPICILLIN <=2 SENSITIVE Sensitive     NITROFURANTOIN <=16 SENSITIVE Sensitive     VANCOMYCIN 1 SENSITIVE Sensitive     * 20,000 COLONIES/mL ENTEROCOCCUS FAECALIS  Resp Panel by RT-PCR (Flu A&B, Covid) Nasopharyngeal Swab     Status: None  Collection Time: 06/08/20  9:22 AM   Specimen: Nasopharyngeal Swab; Nasopharyngeal(NP) swabs in vial transport medium  Result Value Ref Range Status   SARS Coronavirus 2 by RT PCR NEGATIVE NEGATIVE Final    Comment: (NOTE) SARS-CoV-2 target nucleic acids are NOT DETECTED.  The SARS-CoV-2 RNA is generally detectable in upper respiratory specimens during the acute phase of infection. The lowest concentration of SARS-CoV-2 viral copies this assay can detect is 138 copies/mL. A negative result does not preclude SARS-Cov-2 infection and should not be used as the sole basis for treatment or other patient management decisions. A negative result may occur with  improper specimen collection/handling, submission of specimen other than nasopharyngeal swab, presence of viral mutation(s) within the areas targeted by this assay, and inadequate number of viral copies(<138 copies/mL). A negative result must be combined with clinical observations, patient history, and epidemiological information. The expected result is Negative.  Fact Sheet for Patients:  BloggerCourse.com  Fact Sheet for Healthcare Providers:  SeriousBroker.it  This test is no t yet approved or cleared by the [place] FDA and  has been authorized for detection and/or diagnosis of SARS-CoV-2 by FDA under an Emergency Use Authorization (EUA). This EUA will remain  in effect (meaning this test can be used) for the duration of the COVID-19 declaration under Section 564(b)(1) of the Act, 21 U.S.C.section 360bbb-3(b)(1), unless the authorization  is terminated  or revoked sooner.       Influenza A by PCR NEGATIVE NEGATIVE Final   Influenza B by PCR NEGATIVE NEGATIVE Final    Comment: (NOTE) The Xpert Xpress SARS-CoV-2/FLU/RSV plus assay is intended as an aid in the diagnosis of influenza from Nasopharyngeal swab specimens and should not be used as a sole basis for treatment. Nasal washings and aspirates are unacceptable for Xpert Xpress SARS-CoV-2/FLU/RSV testing.  Fact Sheet for Patients: BloggerCourse.com  Fact Sheet for Healthcare Providers: SeriousBroker.it  This test is not yet approved or cleared by the [place] FDA and has been authorized for detection and/or diagnosis of SARS-CoV-2 by FDA under an Emergency Use Authorization (EUA). This EUA will remain in effect (meaning this test can be used) for the duration of the COVID-19 declaration under Section 564(b)(1) of the Act, 21 U.S.C. section 360bbb-3(b)(1), unless the authorization is terminated or revoked.  Performed at [facility], [address] [place], [place] [address]   Culture, blood (single)     Status: None   Collection Time: 06/08/20  1:51 PM   Specimen: BLOOD  Result Value Ref Range Status   Specimen Description BLOOD LEFT ARM  Final   Special Requests   Final    BOTTLES DRAWN AEROBIC AND ANAEROBIC Blood Culture results may not be optimal due to an excessive volume of blood received in culture bottles   Culture   Final    NO GROWTH 5 DAYS Performed at [facility], [address] [place], [place] [address]    Report Status 06/13/2020 FINAL  Final  MRSA PCR Screening     Status: Abnormal   Collection Time: 06/08/20  8:42 PM   Specimen: Nasopharyngeal  Result Value Ref Range Status   MRSA by PCR POSITIVE (A) NEGATIVE Final    Comment:        The GeneXpert MRSA Assay (FDA approved for NASAL specimens only), is one component of a comprehensive MRSA  colonization surveillance program. It is not intended to diagnose MRSA infection nor to guide or monitor treatment for MRSA infections. RESULT CALLED TO,  READ BACK BY AND VERIFIED WITH: NOTIFIED Reynolds Bowl RN 06/08/2020 2311 JG Performed at [facility], [address], [place], [place] [address]          Radiology Studies: No results found.      Scheduled Meds: . apixaban  5 mg Oral BID  . aspirin  81 mg Oral Daily  . atorvastatin  40 mg Oral Daily  . Chlorhexidine Gluconate Cloth  6 each Topical Daily  . DULoxetine  20 mg Oral Daily  . folic acid  1 mg Oral Daily  . furosemide  60 mg Intravenous Q12H  . gabapentin  300 mg Oral TID  . levothyroxine  75 mcg Oral QAC breakfast  . lisinopril  2.5 mg Oral Daily  . LORazepam  0-4 mg Intravenous Q8H  . methocarbamol  750 mg Oral TID  . metoprolol tartrate  100 mg Oral BID  . montelukast  10 mg Oral QHS  . multivitamin with minerals  1 tablet Oral Daily  . nitrofurantoin (macrocrystal-monohydrate)  100 mg Oral Q12H  . polyethylene glycol  17 g Oral Daily  . Ensure Max Protein  11 oz Oral TID WC  . thiamine  100 mg Oral Daily   Or  . thiamine  100 mg Intravenous Daily   Continuous Infusions:    LOS: 6 days    Time spent: 25 minutes    Silvano Bilis, MD [facility]  If 7PM-7AM, please contact night-coverage 06/14/2020, 1:27 PM

## 2020-06-15 ENCOUNTER — Inpatient Hospital Stay: Payer: Medicaid Other

## 2020-06-15 DIAGNOSIS — I4891 Unspecified atrial fibrillation: Secondary | ICD-10-CM | POA: Diagnosis not present

## 2020-06-15 LAB — CBC
HCT: 39.1 % (ref 39.0–52.0)
Hemoglobin: 12.1 g/dL — ABNORMAL LOW (ref 13.0–17.0)
MCH: 31.2 pg (ref 26.0–34.0)
MCHC: 30.9 g/dL (ref 30.0–36.0)
MCV: 100.8 fL — ABNORMAL HIGH (ref 80.0–100.0)
Platelets: 170 10*3/uL (ref 150–400)
RBC: 3.88 MIL/uL — ABNORMAL LOW (ref 4.22–5.81)
RDW: 16.9 % — ABNORMAL HIGH (ref 11.5–15.5)
WBC: 6 10*3/uL (ref 4.0–10.5)
nRBC: 0 % (ref 0.0–0.2)

## 2020-06-15 LAB — CBC WITH DIFFERENTIAL/PLATELET
Abs Immature Granulocytes: 0.05 10*3/uL (ref 0.00–0.07)
Basophils Absolute: 0 10*3/uL (ref 0.0–0.1)
Basophils Relative: 0 %
Eosinophils Absolute: 0 10*3/uL (ref 0.0–0.5)
Eosinophils Relative: 1 %
HCT: 36.2 % — ABNORMAL LOW (ref 39.0–52.0)
Hemoglobin: 11.3 g/dL — ABNORMAL LOW (ref 13.0–17.0)
Immature Granulocytes: 1 %
Lymphocytes Relative: 8 %
Lymphs Abs: 0.5 10*3/uL — ABNORMAL LOW (ref 0.7–4.0)
MCH: 31.4 pg (ref 26.0–34.0)
MCHC: 31.2 g/dL (ref 30.0–36.0)
MCV: 100.6 fL — ABNORMAL HIGH (ref 80.0–100.0)
Monocytes Absolute: 1.6 10*3/uL — ABNORMAL HIGH (ref 0.1–1.0)
Monocytes Relative: 25 %
Neutro Abs: 4.2 10*3/uL (ref 1.7–7.7)
Neutrophils Relative %: 65 %
Platelets: 198 10*3/uL (ref 150–400)
RBC: 3.6 MIL/uL — ABNORMAL LOW (ref 4.22–5.81)
RDW: 16.8 % — ABNORMAL HIGH (ref 11.5–15.5)
WBC: 6.4 10*3/uL (ref 4.0–10.5)
nRBC: 0 % (ref 0.0–0.2)

## 2020-06-15 LAB — BLOOD GAS, ARTERIAL
Acid-Base Excess: 35.5 mmol/L — ABNORMAL HIGH (ref 0.0–2.0)
Allens test (pass/fail): POSITIVE — AB
Bicarbonate: 64.6 mmol/L — ABNORMAL HIGH (ref 20.0–28.0)
FIO2: 0.35
MECHVT: 460 mL
O2 Saturation: 95.8 %
PEEP: 5 cmH2O
Patient temperature: 37
RATE: 18 resp/min
pCO2 arterial: 81 mmHg (ref 32.0–48.0)
pH, Arterial: 7.51 — ABNORMAL HIGH (ref 7.350–7.450)
pO2, Arterial: 73 mmHg — ABNORMAL LOW (ref 83.0–108.0)

## 2020-06-15 LAB — COMPREHENSIVE METABOLIC PANEL
ALT: 27 U/L (ref 0–44)
AST: 28 U/L (ref 15–41)
Albumin: 2.7 g/dL — ABNORMAL LOW (ref 3.5–5.0)
Alkaline Phosphatase: 70 U/L (ref 38–126)
Anion gap: 14 (ref 5–15)
BUN: 20 mg/dL (ref 6–20)
CO2: 44 mmol/L — ABNORMAL HIGH (ref 22–32)
Calcium: 9.1 mg/dL (ref 8.9–10.3)
Chloride: 75 mmol/L — ABNORMAL LOW (ref 98–111)
Creatinine, Ser: 0.63 mg/dL (ref 0.61–1.24)
GFR, Estimated: >60 mL/min (ref >60)
Glucose, Bld: 142 mg/dL — ABNORMAL HIGH (ref 70–99)
Potassium: 3.9 mmol/L (ref 3.5–5.1)
Sodium: 133 mmol/L — ABNORMAL LOW (ref 135–145)
Total Bilirubin: 1 mg/dL (ref 0.3–1.2)
Total Protein: 6.2 g/dL — ABNORMAL LOW (ref 6.5–8.1)

## 2020-06-15 LAB — URINALYSIS, COMPLETE (UACMP) WITH MICROSCOPIC
Bacteria, UA: NONE SEEN
Bilirubin Urine: NEGATIVE
Glucose, UA: NEGATIVE mg/dL
Hgb urine dipstick: NEGATIVE
Ketones, ur: NEGATIVE mg/dL
Leukocytes,Ua: NEGATIVE
Nitrite: NEGATIVE
Protein, ur: NEGATIVE mg/dL
Specific Gravity, Urine: 1.021 (ref 1.005–1.030)
pH: 8 (ref 5.0–8.0)

## 2020-06-15 LAB — BASIC METABOLIC PANEL
Anion gap: 13 (ref 5–15)
BUN: 20 mg/dL (ref 6–20)
CO2: 48 mmol/L — ABNORMAL HIGH (ref 22–32)
Calcium: 9.2 mg/dL (ref 8.9–10.3)
Chloride: 76 mmol/L — ABNORMAL LOW (ref 98–111)
Creatinine, Ser: 0.6 mg/dL — ABNORMAL LOW (ref 0.61–1.24)
GFR, Estimated: >60 mL/min (ref >60)
Glucose, Bld: 106 mg/dL — ABNORMAL HIGH (ref 70–99)
Potassium: 3.5 mmol/L (ref 3.5–5.1)
Sodium: 137 mmol/L (ref 135–145)

## 2020-06-15 LAB — MAGNESIUM: Magnesium: 1.9 mg/dL (ref 1.7–2.4)

## 2020-06-15 LAB — PROTIME-INR
INR: 1.4 — ABNORMAL HIGH (ref 0.8–1.2)
Prothrombin Time: 16.8 seconds — ABNORMAL HIGH (ref 11.4–15.2)

## 2020-06-15 LAB — VITAMIN B12: Vitamin B-12: 530 pg/mL (ref 180–914)

## 2020-06-15 LAB — LACTIC ACID, PLASMA: Lactic Acid, Venous: 1 mmol/L (ref 0.5–1.9)

## 2020-06-15 LAB — FOLATE: Folate: 12.6 ng/mL (ref >5.9)

## 2020-06-15 LAB — PHOSPHORUS: Phosphorus: 2.8 mg/dL (ref 2.5–4.6)

## 2020-06-15 LAB — PROCALCITONIN: Procalcitonin: 0.87 ng/mL

## 2020-06-15 MED ORDER — SODIUM CHLORIDE 0.9 % IV BOLUS
1000.0000 mL | Freq: Once | INTRAVENOUS | Status: AC
  Administered 2020-06-15: 1000 mL via INTRAVENOUS

## 2020-06-15 NOTE — Progress Notes (Signed)
PROGRESS NOTE    [name] [name]  LOV:[id number] DOB: 01-04-1961 DOA: 06/08/2020 PCP: [name] Cords, DO   Brief Narrative:  60 y.o. 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  He denies head trauma and loss of consciousness.  He endorses abdominal pain.  He told EMS that he did not want to go to the hospital and that he just wants to be left alone to die.  At bedside, he denies fever, chest pain, changes to his short baseline shortness of breath.  She denies dysuria and hematuria.  He endorses nausea, and denies vomiting.  He endorses to me that he drank alcohol 4 to 5 hours prior to presentation to the emergency department.  Denies SI and HI.  Presentation consistent with acute on chronic respiratory failure likely secondary to COPD/CHF.  Remains stepdown status.  Was on BiPAP since admission, taken off on 4/25.  Tolerating nasal cannula okay but increased work of breathing.  4/26: Patient apparently began to exhibit some withdrawal symptoms today.  Seen by palliative care.  Patient's birthday is in [birth year] and he wishes to live long enough to see his birthday.  He confirms DNR/DNI status but would like to treat the treatable.  He endorses some shoulder pain as well   Assessment & Plan:   Active Problems:   Centrilobular emphysema (HCC)   Sepsis (HCC)   Chronic atrial fibrillation (HCC)   HTN (hypertension)   Chronic gout  involving toe, unspecified cause, unspecified laterality   TBI (traumatic brain injury) (HCC)   Personal history of DVT (deep vein thrombosis)   Atrial fibrillation with RVR (HCC)   Major depressive disorder, recurrent episode, in partial remission (HCC)   HLD (hyperlipidemia)   Depression   Pressure ulcer   Alcohol abuse   Alcohol withdrawal syndrome without complication (HCC)   GAD (generalized anxiety disorder)   COPD (chronic obstructive pulmonary disease) (HCC)   Atrial fibrillation, chronic (HCC)   Class 3 obesity with alveolar hypoventilation, serious comorbidity, and body mass index (BMI) of 60.0 to 69.9 in adult [facility])   CHF (congestive heart failure) (HCC)   Weakness of both lower extremities   Physical deconditioning   Elevated lactic acid level   Unable to care for self   Aerococcus bacteriuria Typically a contaminant but pt endorses dysuria. Discussed w/ pharm, nitrofurantoin typically covers - continue macrobid (4/27> ), plan for 5 day course  Chronic respiratory failure HFrEF EF 30-35 earlier this year. Requiring intermittent bipap. Currently on [place] O2. Obesity contributory. this morning awake and alert. On 2-3 L chronically, is on that, acute respiratory failure is resolved. Presented w/ acute respiratory failure which is resolved - BiPAP as needed - Continue O2. - holding lasix as below  Dehydration Peripheral edema resolved. Cr not uptrending but poss contraction alkalosis noted by pharmacy. Today with decreased urine output, 400 last 24. - will send urine for ua - will bolus 1 L - if uop doesn't pick up will I/o cath to ensure not retaining - holding lasix as above  Atrial fibrillation with rapid ventricular response Multifactorial. Alcohol  withdrawal versus acute intoxication. Has been weaned of diltiazem gtt and hr now appropriate - have increased metop to 100 bid - cont eliquis  Alcohol use, suspect intoxication Patient drinks half gallon of whiskey a  day No signs withdrawal this morning - ativan prn - cont thiamine  Right sixth rib fracture Acute to subacute. Not complaining of pain - oxy prn for pain  Hypertension Controlled - cont lisinopril and metoprolol  Hypomagnesemia Resolved - monitor  Thrombocytopenia Mild, 137>133>149>170 - monitor for now  Macrocytosis Likely 2/2 etoh - folate wnl, b12 pending  Depression Duloxetine 20 mg daily  Hypothyroid tsh elevated, home compliance unclear - continue levothyroxine - will need repeat tsh in 4-6 wks  OSA/OHS Nightly BiPAP  Morbid obesity This complicates overall care and prognosis  Multiple medical comorbidities Failure to thrive Open aps case. Palliative involved, pt wants to treat the treatable. sw following. APS is involved and is pursuing guardianship. APS WANTS PATIENT KEPT HERE FOR TIME BEING.. Patient is at very high risk for readmission and he has demonstrated that he is unable to safely care for himself at home. It is my opinion that he would strongly benefit from skilled nursing care.   DVT prophylaxis: Eliquis Code Status: DNR Family Communication: sister [name] [name] updated telephonically 4/28 Disposition Plan: Status is: Inpatient  Remains inpatient appropriate because:Inpatient level of care appropriate due to severity of illness   Dispo: The patient is from: Home              Anticipated d/c is to: snf                Patient currently is not medically stable to d/c.   Difficult to place patient No  . Level of care: Progressive Cardiac  Consultants:   None   Procedures: None  Antimicrobials:   Nitrofurantoin 4/27>   Subjective: Patient seen and examined.  Awake and alert. Says breathing stable. No chest pain. OOB to chair yesterday.  Objective: Vitals:   06/14/20 2308 06/15/20 0554 06/15/20 0606 06/15/20 0756  BP:  (!) 131/105  114/69  Pulse: 99 79 93 69  Resp:  20  20  Temp:  99.6 F (37.6 C)  98.4 F (36.9 C)  TempSrc:   Oral  Oral  SpO2: 93% 100% 100% 99%  Weight:  (!) 172.8 kg    Height:        Intake/Output Summary (Last 24 hours) at 06/15/2020 0828 Last data filed at 06/15/2020 0600 Gross per 24 hour  Intake 720 ml  Output 400 ml  Net 320 ml   Filed Weights   06/08/20 2045 06/14/20 0653 06/15/20 0554  Weight: (!) 178.9 kg (!) 180.1 kg (!) 172.8 kg    Examination:  General exam: NAD Respiratory system: rr, scattered crackles,  Cardiovascular system:  irregular rhythm, no murmurs Gastrointestinal system: Obese, nontender, nondistended, normal bowel sounds Central nervous system: Alert and oriented, lethargic, no focal deficits  extremities: Diffusely decreased power bilaterally. Skin: Dry skin, scattered excoriations. No LE edema Psychiatry: Judgement and insight appear impaired. Mood & affect flattened.     Data Reviewed: I have personally reviewed following labs and imaging studies  CBC: Recent Labs  Lab 06/08/20 0919 06/09/20 0053 06/12/20 0437 06/13/20 0550 06/14/20 0704 06/15/20 0419  WBC 8.5 9.7 7.1 5.2 6.4 6.0  NEUTROABS 6.0  --   --   --   --   --   HGB 12.9* 12.3* 11.8* 11.4* 11.6* 12.1*  HCT 38.6* 37.7* 38.9*  37.8* 37.5* 39.1  MCV 96.0 97.4 106.0* 105.6* 103.6* 100.8*  PLT 225 161 137* 133* 149* 170   Basic Metabolic Panel: Recent Labs  Lab 06/11/20 0357 06/12/20 0437 06/13/20 0550 06/14/20 0704 06/15/20 0419  NA 135 139 136 135 137  K 4.1 4.4 3.9 3.8 3.5  CL 86* 87* 82* 76* 76*  CO2 36* 41* 43* 49* 48*  GLUCOSE 115* 106* 104* 134* 106*  BUN 15 17 17 13 20   CREATININE 0.72 0.66 0.65 0.58* 0.60*  CALCIUM 8.7* 8.8* 8.7* 9.0 9.2  MG 1.8 1.8 1.4* 1.4* 1.9  PHOS 2.9 3.1 2.7 3.2 2.8   GFR: Estimated Creatinine Clearance: 155 mL/min (A) (by C-G formula based on SCr of 0.6 mg/dL (L)). Liver Function Tests: Recent Labs  Lab 06/08/20 0919  AST 25  ALT 12  ALKPHOS 82  BILITOT 0.7  PROT 7.4  ALBUMIN 3.6   No results for input(s): LIPASE, AMYLASE in the last  168 hours. No results for input(s): AMMONIA in the last 168 hours. Coagulation Profile: Recent Labs  Lab 06/08/20 0919 06/09/20 0053  INR 1.0 1.1   Cardiac Enzymes: No results for input(s): CKTOTAL, CKMB, CKMBINDEX, TROPONINI in the last 168 hours. BNP (last 3 results) No results for input(s): PROBNP in the last 8760 hours. HbA1C: No results for input(s): HGBA1C in the last 72 hours. CBG: Recent Labs  Lab 06/08/20 2045 06/12/20 1147  GLUCAP 154* 127*   Lipid Profile: No results for input(s): CHOL, HDL, LDLCALC, TRIG, CHOLHDL, LDLDIRECT in the last 72 hours. Thyroid Function Tests: No results for input(s): TSH, T4TOTAL, FREET4, T3FREE, THYROIDAB in the last 72 hours. Anemia Panel: Recent Labs    06/15/20 0419  FOLATE 12.6   Sepsis Labs: Recent Labs  Lab 06/08/20 1538 06/08/20 2045 06/09/20 0053 06/09/20 0834 06/09/20 1131  PROCALCITON  --   --  <0.10  --   --   LATICACIDVEN 4.5* 3.9*  --  1.6 1.6    Recent Results (from the past 240 hour(s))  Blood culture (routine single)     Status: None   Collection Time: 06/08/20  9:19 AM   Specimen: BLOOD  Result Value Ref Range Status   Specimen Description BLOOD RIGHT [facility]  Final   Special Requests   Final    BOTTLES DRAWN AEROBIC AND ANAEROBIC Blood Culture results may not be optimal due to an inadequate volume of blood received in culture bottles   Culture   Final    NO GROWTH 5 DAYS Performed at [facility], [address], [place], [place]    Report Status 06/13/2020 FINAL  Final  Urine culture     Status: Abnormal   Collection Time: 06/08/20  9:19 AM   Specimen: Urine, Random  Result Value Ref Range Status   Specimen Description   Final    URINE, RANDOM Performed at [facility], [address], [place], [place]    Special Requests   Final    NONE Performed at [facility], [address] [place], [place]    Culture (A)  Final    >=100,000  COLONIES/mL AEROCOCCUS URINAE 20,000 COLONIES/mL ENTEROCOCCUS FAECALIS Standardized susceptibility testing for this organism is not available. Performed at [facility], [address], [place], [place]    Report Status 06/11/2020 FINAL  Final   Organism ID, Bacteria ENTEROCOCCUS FAECALIS (A)  Final      Susceptibility   Enterococcus faecalis - MIC*  AMPICILLIN <=2 SENSITIVE Sensitive     NITROFURANTOIN <=16 SENSITIVE Sensitive     VANCOMYCIN 1 SENSITIVE Sensitive     * 20,000 COLONIES/mL ENTEROCOCCUS FAECALIS  Resp Panel by RT-PCR (Flu A&B, Covid) Nasopharyngeal Swab     Status: None   Collection Time: 06/08/20  9:22 AM   Specimen: Nasopharyngeal Swab; Nasopharyngeal(NP) swabs in vial transport medium  Result Value Ref Range Status   SARS Coronavirus 2 by RT PCR NEGATIVE NEGATIVE Final    Comment: (NOTE) SARS-CoV-2 target nucleic acids are NOT DETECTED.  The SARS-CoV-2 RNA is generally detectable in upper respiratory specimens during the acute phase of infection. The lowest concentration of SARS-CoV-2 viral copies this assay can detect is 138 copies/mL. A negative result does not preclude SARS-Cov-2 infection and should not be used as the sole basis for treatment or other patient management decisions. A negative result may occur with  improper specimen collection/handling, submission of specimen other than nasopharyngeal swab, presence of viral mutation(s) within the areas targeted by this assay, and inadequate number of viral copies(<138 copies/mL). A negative result must be combined with clinical observations, patient history, and epidemiological information. The expected result is Negative.  Fact Sheet for Patients:  BloggerCourse.com  Fact Sheet for Healthcare Providers:  SeriousBroker.it  This test is no t yet approved or cleared by the [place] FDA and  has been authorized for detection and/or  diagnosis of SARS-CoV-2 by FDA under an Emergency Use Authorization (EUA). This EUA will remain  in effect (meaning this test can be used) for the duration of the COVID-19 declaration under Section 564(b)(1) of the Act, 21 U.S.C.section 360bbb-3(b)(1), unless the authorization is terminated  or revoked sooner.       Influenza A by PCR NEGATIVE NEGATIVE Final   Influenza B by PCR NEGATIVE NEGATIVE Final    Comment: (NOTE) The Xpert Xpress SARS-CoV-2/FLU/RSV plus assay is intended as an aid in the diagnosis of influenza from Nasopharyngeal swab specimens and should not be used as a sole basis for treatment. Nasal washings and aspirates are unacceptable for Xpert Xpress SARS-CoV-2/FLU/RSV testing.  Fact Sheet for Patients: BloggerCourse.com  Fact Sheet for Healthcare Providers: SeriousBroker.it  This test is not yet approved or cleared by the [place] FDA and has been authorized for detection and/or diagnosis of SARS-CoV-2 by FDA under an Emergency Use Authorization (EUA). This EUA will remain in effect (meaning this test can be used) for the duration of the COVID-19 declaration under Section 564(b)(1) of the Act, 21 U.S.C. section 360bbb-3(b)(1), unless the authorization is terminated or revoked.  Performed at [facility], [address] [place], [place] [address]   Culture, blood (single)     Status: None   Collection Time: 06/08/20  1:51 PM   Specimen: BLOOD  Result Value Ref Range Status   Specimen Description BLOOD LEFT ARM  Final   Special Requests   Final    BOTTLES DRAWN AEROBIC AND ANAEROBIC Blood Culture results may not be optimal due to an excessive volume of blood received in culture bottles   Culture   Final    NO GROWTH 5 DAYS Performed at [facility], [address], [place], [place] [address]    Report Status 06/13/2020 FINAL  Final  MRSA PCR Screening     Status: Abnormal    Collection Time: 06/08/20  8:42 PM   Specimen: Nasopharyngeal  Result Value Ref Range Status   MRSA by PCR POSITIVE (A) NEGATIVE Final    Comment:  The GeneXpert MRSA Assay (FDA approved for NASAL specimens only), is one component of a comprehensive MRSA colonization surveillance program. It is not intended to diagnose MRSA infection nor to guide or monitor treatment for MRSA infections. RESULT CALLED TO, READ BACK BY AND VERIFIED WITH: NOTIFIED Reynolds BowlBETH BUONO RN 06/08/2020 2311 JG Performed at [facility], [address] [place], [place] [address]          Radiology Studies: No results found.      Scheduled Meds: . apixaban  5 mg Oral BID  . aspirin  81 mg Oral Daily  . atorvastatin  40 mg Oral Daily  . Chlorhexidine Gluconate Cloth  6 each Topical Daily  . DULoxetine  20 mg Oral Daily  . folic acid  1 mg Oral Daily  . gabapentin  300 mg Oral TID  . levothyroxine  75 mcg Oral QAC breakfast  . lisinopril  2.5 mg Oral Daily  . methocarbamol  750 mg Oral TID  . metoprolol tartrate  100 mg Oral BID  . montelukast  10 mg Oral QHS  . multivitamin with minerals  1 tablet Oral Daily  . nitrofurantoin (macrocrystal-monohydrate)  100 mg Oral Q12H  . polyethylene glycol  17 g Oral Daily  . Ensure Max Protein  11 oz Oral TID WC  . thiamine  100 mg Oral Daily   Or  . thiamine  100 mg Intravenous Daily   Continuous Infusions: . sodium chloride       LOS: 7 days    Time spent: 25 minutes    Silvano BilisNoah B Joud Ingwersen, MD [facility]  If 7PM-7AM, please contact night-coverage 06/15/2020, 8:28 AM

## 2020-06-15 NOTE — Progress Notes (Signed)
Nursing notified me of fever to 100.7, repeat 100.3. no tachycardia or hypotension. Pt reports breathing status a bit worse and there are coarse breath sounds throughout, also hypoxic to 70s after repositioning. rn reports incontinent to urine with at least moderate output though urine is tea colored. No abdominal pain. No vomiting or diarrhea. No signs cellulitis. Will check labs, ua, blood cultures, and cxr. Will also place back on bipap as does require that intermittently (chronic hypoxia, ohs, copd). Unlikely pe as has been receiving eliquis

## 2020-06-16 ENCOUNTER — Inpatient Hospital Stay: Payer: Medicaid Other

## 2020-06-16 DIAGNOSIS — I4891 Unspecified atrial fibrillation: Secondary | ICD-10-CM | POA: Diagnosis not present

## 2020-06-16 LAB — PROCALCITONIN: Procalcitonin: 0.71 ng/mL

## 2020-06-16 LAB — CBC
HCT: 34.5 % — ABNORMAL LOW (ref 39.0–52.0)
Hemoglobin: 11.1 g/dL — ABNORMAL LOW (ref 13.0–17.0)
MCH: 31.9 pg (ref 26.0–34.0)
MCHC: 32.2 g/dL (ref 30.0–36.0)
MCV: 99.1 fL (ref 80.0–100.0)
Platelets: 181 10*3/uL (ref 150–400)
RBC: 3.48 MIL/uL — ABNORMAL LOW (ref 4.22–5.81)
RDW: 17.1 % — ABNORMAL HIGH (ref 11.5–15.5)
WBC: 7.9 10*3/uL (ref 4.0–10.5)
nRBC: 0 % (ref 0.0–0.2)

## 2020-06-16 LAB — BASIC METABOLIC PANEL
Anion gap: 11 (ref 5–15)
BUN: 18 mg/dL (ref 6–20)
CO2: 44 mmol/L — ABNORMAL HIGH (ref 22–32)
Calcium: 8.9 mg/dL (ref 8.9–10.3)
Chloride: 77 mmol/L — ABNORMAL LOW (ref 98–111)
Creatinine, Ser: 0.58 mg/dL — ABNORMAL LOW (ref 0.61–1.24)
GFR, Estimated: >60 mL/min (ref >60)
Glucose, Bld: 147 mg/dL — ABNORMAL HIGH (ref 70–99)
Potassium: 3.5 mmol/L (ref 3.5–5.1)
Sodium: 132 mmol/L — ABNORMAL LOW (ref 135–145)

## 2020-06-16 LAB — PHOSPHORUS: Phosphorus: 2.6 mg/dL (ref 2.5–4.6)

## 2020-06-16 LAB — MAGNESIUM: Magnesium: 1.6 mg/dL — ABNORMAL LOW (ref 1.7–2.4)

## 2020-06-16 MED ORDER — FUROSEMIDE 40 MG PO TABS
60.0000 mg | ORAL_TABLET | Freq: Every day | ORAL | Status: DC
  Administered 2020-06-16 – 2020-06-29 (×14): 60 mg via ORAL
  Filled 2020-06-16 (×14): qty 1

## 2020-06-16 MED ORDER — MAGNESIUM SULFATE 2 GM/50ML IV SOLN
2.0000 g | Freq: Once | INTRAVENOUS | Status: AC
  Administered 2020-06-16: 2 g via INTRAVENOUS
  Filled 2020-06-16: qty 50

## 2020-06-16 MED ORDER — SODIUM CHLORIDE 0.9 % IV SOLN
2.0000 g | INTRAVENOUS | Status: AC
  Administered 2020-06-16 – 2020-06-20 (×5): 2 g via INTRAVENOUS
  Filled 2020-06-16 (×5): qty 2

## 2020-06-16 MED ORDER — PREDNISONE 20 MG PO TABS
40.0000 mg | ORAL_TABLET | Freq: Every day | ORAL | Status: DC
  Administered 2020-06-16 – 2020-06-23 (×7): 40 mg via ORAL
  Filled 2020-06-16 (×9): qty 2

## 2020-06-16 MED ORDER — NITROFURANTOIN MONOHYD MACRO 100 MG PO CAPS
100.0000 mg | ORAL_CAPSULE | Freq: Two times a day (BID) | ORAL | Status: AC
  Administered 2020-06-16 (×2): 100 mg via ORAL
  Filled 2020-06-16 (×2): qty 1

## 2020-06-16 MED ORDER — IOHEXOL 300 MG/ML  SOLN
125.0000 mL | Freq: Once | INTRAMUSCULAR | Status: AC | PRN
  Administered 2020-06-16: 125 mL via INTRAVENOUS

## 2020-06-16 NOTE — Plan of Care (Signed)
Informed Dr. Ardelia Mems to get VBG or ABG - VBG tried and failed 2x - then Resp went in to get ABG and patient stated - "You're not sticking me anymore, just let me go (die)."  I, RN, even went in to try to educate and persuade, but still refused.

## 2020-06-16 NOTE — Consult Note (Signed)
WOC Nurse wound follow up Wound type: ITD (intertriginous dermatitis) at the sub pannicular and bilateral inguinal skin folds LE with edema no open wounds   Measurement:NA Wound XBW:IOMBT of intense redness under the pannus with odor Drainage (amount, consistency, odor) none Periwound:intact Dressing procedure/placement/frequency: Use antimicrobial wicking fabric previously ordered under the pannus for ITD and candida  Unna's boots have been removed by RN and MD to assess for acute cellulitis Unna's boots reapplied per MD orders; legs appear unchanged.   Patient complains of "bruise to the bone" left lateral forearm.  Added silicone foam for comfort.   WOC nurse team will follow along for weekly Unna's boots Tishia Maestre [facility], CNS, [employer] [phone]

## 2020-06-16 NOTE — Progress Notes (Signed)
Pt. Refused abg.

## 2020-06-16 NOTE — Plan of Care (Signed)

## 2020-06-16 NOTE — Progress Notes (Addendum)
PROGRESS NOTE    [name] [name]  IOE:[id number] DOB: [birth year] DOA: 06/08/2020 PCP: [name], DO   Brief Narrative:  60 y.o. 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  He denies head trauma and loss of consciousness.  He endorses abdominal pain.  He told EMS that he did not want to go to the hospital and that he just wants to be left alone to die.  At bedside, he denies fever, chest pain, changes to his short baseline shortness of breath.  She denies dysuria and hematuria.  He endorses nausea, and denies vomiting.  He endorses to me that he drank alcohol 4 to 5 hours prior to presentation to the emergency department.  Denies SI and HI.  Presentation consistent with acute on chronic respiratory failure likely secondary to COPD/CHF.  Remains stepdown status.  Was on BiPAP since admission, taken off on 4/25.  Tolerating nasal cannula okay but increased work of breathing.  4/26: Patient apparently began to exhibit some withdrawal symptoms today.  Seen by palliative care.  Patient's birthday is in [birth year] and he wishes to live long enough to see his birthday.  He confirms DNR/DNI status but would like to treat the treatable.  He endorses some shoulder pain as well   Assessment & Plan:   Active Problems:   Centrilobular emphysema (HCC)   Sepsis (HCC)   Chronic atrial fibrillation (HCC)   HTN (hypertension)   Chronic gout  involving toe, unspecified cause, unspecified laterality   TBI (traumatic brain injury) (HCC)   Personal history of DVT (deep vein thrombosis)   Atrial fibrillation with RVR (HCC)   Major depressive disorder, recurrent episode, in partial remission (HCC)   HLD (hyperlipidemia)   Depression   Pressure ulcer   Alcohol abuse   Alcohol withdrawal syndrome without complication (HCC)   GAD (generalized anxiety disorder)   COPD (chronic obstructive pulmonary disease) (HCC)   Atrial fibrillation, chronic (HCC)   Class 3 obesity with alveolar hypoventilation, serious comorbidity, and body mass index (BMI) of 60.0 to 69.9 in adult [facility])   CHF (congestive heart failure) (HCC)   Weakness of both lower extremities   Physical deconditioning   Elevated lactic acid level   Unable to care for self  Fever Gout Possible cellulitis Yesterday and today mild fever. Patient does complain of polyarticular symptoms particularly feet and wrists which he says is consistent with one of his gout flares. Urinalysis not suggestive of infection, neither is CXR. Blood cultures ngtd. Skin fully examined with RN today. There is some erythema and swelling and tenderness of LLE. This is most consistent w/ his known venous stasis dermatitis but right now think prudent to cover w/ abx - start certriaxone (chosing using cellulitis order set for moderate non-purulent cellulitis). Consider escalation to vanc if worsens - start prednisone for gout - f/u cultures - f/u ct c/a/p for further eval as source of fever still not clear  Aerococcus bacteriuria Typically a contaminant but pt endorses dysuria. Discussed w/ pharm, nitrofurantoin typically covers - continue macrobid (4/27> ), plan for 5 day course to  finish today  Chronic respiratory failure HFrEF EF 30-35 earlier this year. Requiring intermittent bipap. Currently on  O2. Obesity contributory. this morning awake and alert. On 2-3 L chronically, is on that, acute  respiratory failure is resolved. Presented w/ acute respiratory failure which is resolved but does require intermittent bipap. With new mild LE swelling today (off lasix yesterday) - BiPAP as needed - Continue O2. - resume lasix at 60 oral qd  Atrial fibrillation with rapid ventricular response Multifactorial. Alcohol withdrawal versus acute intoxication. Has been weaned of diltiazem gtt and hr now appropriate - have increased metop to 100 bid - cont eliquis  Alcohol use Patient drinks half gallon of whiskey a day No signs withdrawal this morning - ativan prn - cont thiamine  Right sixth rib fracture Acute to subacute. Not complaining of pain - oxy prn for pain  Hypertension Controlled - cont lisinopril and metoprolol  Hypomagnesemia 1.6 today - 2g  Hyponatremia New today 132. Possibly 2/2 decreased renal perfusion given stop of lasix yesterday - re-start lasix Thrombocytopenia Mild, 137>133>149>170 - monitor for now  Macrocytosis Likely 2/2 etoh. Folate and b12 wnl  Depression Duloxetine 20 mg daily  Hypothyroid tsh elevated, home compliance unclear - continue levothyroxine - will need repeat tsh in 4-6 wks  OSA/OHS Nightly BiPAP  Morbid obesity This complicates overall care and prognosis  Multiple medical comorbidities Failure to thrive Open aps case. Palliative involved, pt wants to treat the treatable. sw following. APS is involved and is pursuing guardianship. APS WANTS PATIENT KEPT HERE FOR TIME BEING.. Patient is at very high risk for readmission and he has demonstrated that he is unable to safely care for himself at home. It is my opinion that he would strongly benefit from skilled nursing care.   DVT prophylaxis: Eliquis Code Status: DNR Family Communication: sister [name] updated telephonically 4/28 Disposition Plan: Status is: Inpatient  Remains inpatient appropriate because:Inpatient level of care appropriate due to severity of  illness   Dispo: The patient is from: Home              Anticipated d/c is to: snf                Patient currently is not medically stable to d/c.   Difficult to place patient No  . Level of care: Progressive Cardiac  Consultants:   None   Procedures: None  Antimicrobials:   Nitrofurantoin 4/27>5/2  Vancomycin 5/2>   Subjective: Patient seen and examined.  Awake and alert. Says has pain in feet and wrists  Objective: Vitals:   06/15/20 2007 06/15/20 2248 06/16/20 0343 06/16/20 0800  BP: 127/62  (!) 109/55 116/82  Pulse: 82 90 96 91  Resp: 20  20 20   Temp: 98.6 F (37 C)  98 F (36.7 C) 100.3 F (37.9 C)  TempSrc:    Oral  SpO2: 93% 94% 98% 90%  Weight:   (!) 176.6 kg   Height:        Intake/Output Summary (Last 24 hours) at 06/16/2020 0829 Last data filed at 06/16/2020 0801 Gross per 24 hour  Intake --  Output 700 ml  Net -700 ml   Filed Weights   06/14/20 0653 06/15/20 0554 06/16/20 0343  Weight: (!) 180.1 kg (!) 172.8 kg (!) 176.6 kg    Examination:  General exam: NAD Respiratory system: rr, scattered crackles,  Cardiovascular system:  irregular rhythm, no murmurs Gastrointestinal system: Obese, nontender, nondistended, normal bowel sounds Central nervous system: Alert and oriented,  lethargic, no focal deficits  extremities: Diffusely decreased power bilaterally. Skin: erythema b/l shins, no fluctuance, some tenderness. Stage 1 sacral decub, stage 2 mild b/l thighs Psychiatry: Judgement and insight appear impaired. Mood & affect flattened.     Data Reviewed: I have personally reviewed following labs and imaging studies  CBC: Recent Labs  Lab 06/13/20 0550 06/14/20 0704 06/15/20 0419 06/15/20 1758 06/16/20 0559  WBC 5.2 6.4 6.0 6.4 7.9  NEUTROABS  --   --   --  4.2  --   HGB 11.4* 11.6* 12.1* 11.3* 11.1*  HCT 37.8* 37.5* 39.1 36.2* 34.5*  MCV 105.6* 103.6* 100.8* 100.6* 99.1  PLT 133* 149* 170 198 181   Basic Metabolic Panel: Recent  Labs  Lab 06/12/20 0437 06/13/20 0550 06/14/20 0704 06/15/20 0419 06/15/20 1758 06/16/20 0559  NA 139 136 135 137 133* 132*  K 4.4 3.9 3.8 3.5 3.9 3.5  CL 87* 82* 76* 76* 75* 77*  CO2 41* 43* 49* 48* 44* 44*  GLUCOSE 106* 104* 134* 106* 142* 147*  BUN 17 17 13 20 20 18   CREATININE 0.66 0.65 0.58* 0.60* 0.63 0.58*  CALCIUM 8.8* 8.7* 9.0 9.2 9.1 8.9  MG 1.8 1.4* 1.4* 1.9  --  1.6*  PHOS 3.1 2.7 3.2 2.8  --  2.6   GFR: Estimated Creatinine Clearance: 157.1 mL/min (A) (by C-G formula based on SCr of 0.58 mg/dL (L)). Liver Function Tests: Recent Labs  Lab 06/15/20 1758  AST 28  ALT 27  ALKPHOS 70  BILITOT 1.0  PROT 6.2*  ALBUMIN 2.7*   No results for input(s): LIPASE, AMYLASE in the last 168 hours. No results for input(s): AMMONIA in the last 168 hours. Coagulation Profile: Recent Labs  Lab 06/15/20 1758  INR 1.4*   Cardiac Enzymes: No results for input(s): CKTOTAL, CKMB, CKMBINDEX, TROPONINI in the last 168 hours. BNP (last 3 results) No results for input(s): PROBNP in the last 8760 hours. HbA1C: No results for input(s): HGBA1C in the last 72 hours. CBG: Recent Labs  Lab 06/12/20 1147  GLUCAP 127*   Lipid Profile: No results for input(s): CHOL, HDL, LDLCALC, TRIG, CHOLHDL, LDLDIRECT in the last 72 hours. Thyroid Function Tests: No results for input(s): TSH, T4TOTAL, FREET4, T3FREE, THYROIDAB in the last 72 hours. Anemia Panel: Recent Labs    06/15/20 0419  VITAMINB12 530  FOLATE 12.6   Sepsis Labs: Recent Labs  Lab 06/09/20 0834 06/09/20 1131 06/15/20 1758 06/15/20 1800 06/16/20 0559  PROCALCITON  --   --  0.87  --  0.71  LATICACIDVEN 1.6 1.6  --  1.0  --     Recent Results (from the past 240 hour(s))  Blood culture (routine single)     Status: None   Collection Time: 06/08/20  9:19 AM   Specimen: BLOOD  Result Value Ref Range Status   Specimen Description BLOOD RIGHT AC  Final   Special Requests   Final    BOTTLES DRAWN AEROBIC AND  ANAEROBIC Blood Culture results may not be optimal due to an inadequate volume of blood received in culture bottles   Culture   Final    NO GROWTH 5 DAYS Performed at [facility], [address] [place], [place] [id number]    Report Status 06/13/2020 FINAL  Final  Urine culture     Status: Abnormal   Collection Time: 06/08/20  9:19 AM   Specimen: Urine, Random  Result Value Ref Range Status   Specimen Description   Final  URINE, RANDOM Performed at [facility], [address], [place], [place] [address]    Special Requests   Final    NONE Performed at [facility], [address] [place], [place] [address]    Culture (A)  Final    >=100,000 COLONIES/mL AEROCOCCUS URINAE 20,000 COLONIES/mL ENTEROCOCCUS FAECALIS Standardized susceptibility testing for this organism is not available. Performed at [facility], [address] [place], [place] [address]    Report Status 06/11/2020 FINAL  Final   Organism ID, Bacteria ENTEROCOCCUS FAECALIS (A)  Final      Susceptibility   Enterococcus faecalis - MIC*    AMPICILLIN <=2 SENSITIVE Sensitive     NITROFURANTOIN <=16 SENSITIVE Sensitive     VANCOMYCIN 1 SENSITIVE Sensitive     * 20,000 COLONIES/mL ENTEROCOCCUS FAECALIS  Resp Panel by RT-PCR (Flu A&B, Covid) Nasopharyngeal Swab     Status: None   Collection Time: 06/08/20  9:22 AM   Specimen: Nasopharyngeal Swab; Nasopharyngeal(NP) swabs in vial transport medium  Result Value Ref Range Status   SARS Coronavirus 2 by RT PCR NEGATIVE NEGATIVE Final    Comment: (NOTE) SARS-CoV-2 target nucleic acids are NOT DETECTED.  The SARS-CoV-2 RNA is generally detectable in upper respiratory specimens during the acute phase of infection. The lowest concentration of SARS-CoV-2 viral copies this assay can detect is 138 copies/mL. A negative result does not preclude SARS-Cov-2 infection and should not be used as the sole basis for treatment or other patient  management decisions. A negative result may occur with  improper specimen collection/handling, submission of specimen other than nasopharyngeal swab, presence of viral mutation(s) within the areas targeted by this assay, and inadequate number of viral copies(<138 copies/mL). A negative result must be combined with clinical observations, patient history, and epidemiological information. The expected result is Negative.  Fact Sheet for Patients:  BloggerCourse.com  Fact Sheet for Healthcare Providers:  SeriousBroker.it  This test is no t yet approved or cleared by the [place] FDA and  has been authorized for detection and/or diagnosis of SARS-CoV-2 by FDA under an Emergency Use Authorization (EUA). This EUA will remain  in effect (meaning this test can be used) for the duration of the COVID-19 declaration under Section 564(b)(1) of the Act, 21 U.S.C.section 360bbb-3(b)(1), unless the authorization is terminated  or revoked sooner.       Influenza A by PCR NEGATIVE NEGATIVE Final   Influenza B by PCR NEGATIVE NEGATIVE Final    Comment: (NOTE) The Xpert Xpress SARS-CoV-2/FLU/RSV plus assay is intended as an aid in the diagnosis of influenza from Nasopharyngeal swab specimens and should not be used as a sole basis for treatment. Nasal washings and aspirates are unacceptable for Xpert Xpress SARS-CoV-2/FLU/RSV testing.  Fact Sheet for Patients: BloggerCourse.com  Fact Sheet for Healthcare Providers: SeriousBroker.it  This test is not yet approved or cleared by the [place] FDA and has been authorized for detection and/or diagnosis of SARS-CoV-2 by FDA under an Emergency Use Authorization (EUA). This EUA will remain in effect (meaning this test can be used) for the duration of the COVID-19 declaration under Section 564(b)(1) of the Act, 21 U.S.C. section 360bbb-3(b)(1),  unless the authorization is terminated or revoked.  Performed at [facility], [address] [place], [place] [address]   Culture, blood (single)     Status: None   Collection Time: 06/08/20  1:51 PM   Specimen: BLOOD  Result Value Ref Range Status   Specimen Description BLOOD  LEFT ARM  Final   Special Requests   Final    BOTTLES DRAWN AEROBIC AND ANAEROBIC Blood Culture results may not be optimal due to an excessive volume of blood received in culture bottles   Culture   Final    NO GROWTH 5 DAYS Performed at [facility], [address] [place], [place] [address]    Report Status 06/13/2020 FINAL  Final  MRSA PCR Screening     Status: Abnormal   Collection Time: 06/08/20  8:42 PM   Specimen: Nasopharyngeal  Result Value Ref Range Status   MRSA by PCR POSITIVE (A) NEGATIVE Final    Comment:        The GeneXpert MRSA Assay (FDA approved for NASAL specimens only), is one component of a comprehensive MRSA colonization surveillance program. It is not intended to diagnose MRSA infection nor to guide or monitor treatment for MRSA infections. RESULT CALLED TO, READ BACK BY AND VERIFIED WITH: NOTIFIED Reynolds Bowl RN 06/08/2020 2311 JG Performed at [facility], [address] [place], [place] [address]   CULTURE, BLOOD (ROUTINE X 2) w Reflex to ID Panel     Status: None (Preliminary result)   Collection Time: 06/15/20  5:59 PM   Specimen: BLOOD  Result Value Ref Range Status   Specimen Description BLOOD BLOOD LEFT HAND  Final   Special Requests   Final    BOTTLES DRAWN AEROBIC AND ANAEROBIC Blood Culture results may not be optimal due to an inadequate volume of blood received in culture bottles   Culture   Final    NO GROWTH < 12 HOURS Performed at [facility], [address] [place], [place] [address]    Report Status PENDING  Incomplete  CULTURE, BLOOD (ROUTINE X 2) w Reflex to ID Panel     Status: None (Preliminary result)    Collection Time: 06/15/20  6:01 PM   Specimen: BLOOD  Result Value Ref Range Status   Specimen Description BLOOD BLOOD RIGHT HAND  Final   Special Requests   Final    BOTTLES DRAWN AEROBIC ONLY Blood Culture results may not be optimal due to an inadequate volume of blood received in culture bottles   Culture   Final    NO GROWTH < 12 HOURS Performed at [facility], [address] [place], [place] [address]    Report Status PENDING  Incomplete         Radiology Studies: DG Chest Port 1 View  Result Date: 06/15/2020 CLINICAL DATA:  Fever, history of COPD, asthma EXAM: PORTABLE CHEST 1 VIEW COMPARISON:  06/08/2020 FINDINGS: Single frontal view of the chest demonstrates stable enlargement of the cardiac silhouette. Chronic central vascular congestion without airspace disease, effusion, or pneumothorax. No acute bony abnormalities. IMPRESSION: 1. Stable exam, no acute process. Electronically Signed   By: Sharlet Salina M.D.   On: 06/15/2020 16:58        Scheduled Meds: . apixaban  5 mg Oral BID  . aspirin  81 mg Oral Daily  . atorvastatin  40 mg Oral Daily  . Chlorhexidine Gluconate Cloth  6 each Topical Daily  . DULoxetine  20 mg Oral Daily  . folic acid  1 mg Oral Daily  . gabapentin  300 mg Oral TID  . levothyroxine  75 mcg Oral QAC breakfast  . lisinopril  2.5 mg Oral Daily  . methocarbamol  750 mg Oral TID  . metoprolol tartrate  100 mg Oral BID  .  montelukast  10 mg Oral QHS  . multivitamin with minerals  1 tablet Oral Daily  . nitrofurantoin (macrocrystal-monohydrate)  100 mg Oral Q12H  . polyethylene glycol  17 g Oral Daily  . Ensure Max Protein  11 oz Oral TID WC  . thiamine  100 mg Oral Daily   Or  . thiamine  100 mg Intravenous Daily   Continuous Infusions:    LOS: 8 days    Time spent: 25 minutes    Silvano Bilis, MD [facility]  If 7PM-7AM, please contact night-coverage 06/16/2020, 8:29 AM

## 2020-06-17 DIAGNOSIS — I4891 Unspecified atrial fibrillation: Secondary | ICD-10-CM | POA: Diagnosis not present

## 2020-06-17 LAB — BASIC METABOLIC PANEL
Anion gap: 13 (ref 5–15)
BUN: 16 mg/dL (ref 6–20)
CO2: 42 mmol/L — ABNORMAL HIGH (ref 22–32)
Calcium: 8.6 mg/dL — ABNORMAL LOW (ref 8.9–10.3)
Chloride: 78 mmol/L — ABNORMAL LOW (ref 98–111)
Creatinine, Ser: 0.5 mg/dL — ABNORMAL LOW (ref 0.61–1.24)
GFR, Estimated: >60 mL/min (ref >60)
Glucose, Bld: 101 mg/dL — ABNORMAL HIGH (ref 70–99)
Potassium: 3.1 mmol/L — ABNORMAL LOW (ref 3.5–5.1)
Sodium: 133 mmol/L — ABNORMAL LOW (ref 135–145)

## 2020-06-17 LAB — CBC
HCT: 33.2 % — ABNORMAL LOW (ref 39.0–52.0)
Hemoglobin: 11.1 g/dL — ABNORMAL LOW (ref 13.0–17.0)
MCH: 32.2 pg (ref 26.0–34.0)
MCHC: 33.4 g/dL (ref 30.0–36.0)
MCV: 96.2 fL (ref 80.0–100.0)
Platelets: 199 10*3/uL (ref 150–400)
RBC: 3.45 MIL/uL — ABNORMAL LOW (ref 4.22–5.81)
RDW: 16.6 % — ABNORMAL HIGH (ref 11.5–15.5)
WBC: 6.6 10*3/uL (ref 4.0–10.5)
nRBC: 0 % (ref 0.0–0.2)

## 2020-06-17 LAB — PHOSPHORUS: Phosphorus: 3.6 mg/dL (ref 2.5–4.6)

## 2020-06-17 LAB — PROCALCITONIN: Procalcitonin: 0.49 ng/mL

## 2020-06-17 LAB — MAGNESIUM: Magnesium: 1.7 mg/dL (ref 1.7–2.4)

## 2020-06-17 MED ORDER — MAGNESIUM SULFATE 2 GM/50ML IV SOLN
2.0000 g | Freq: Once | INTRAVENOUS | Status: AC
  Administered 2020-06-17: 2 g via INTRAVENOUS
  Filled 2020-06-17: qty 50

## 2020-06-17 MED ORDER — POTASSIUM CHLORIDE CRYS ER 20 MEQ PO TBCR
40.0000 meq | EXTENDED_RELEASE_TABLET | Freq: Two times a day (BID) | ORAL | Status: AC
  Administered 2020-06-17 (×2): 40 meq via ORAL
  Filled 2020-06-17 (×2): qty 2

## 2020-06-17 MED ORDER — ASCORBIC ACID 500 MG PO TABS
500.0000 mg | ORAL_TABLET | Freq: Two times a day (BID) | ORAL | Status: DC
  Administered 2020-06-17 – 2020-06-29 (×24): 500 mg via ORAL
  Filled 2020-06-17 (×25): qty 1

## 2020-06-17 NOTE — Progress Notes (Signed)
PROGRESS NOTE    [name] [name]  BJS:[id number] DOB: Dec 20, 1960 DOA: 06/08/2020 PCP: Smitty CordsKaramalegos, Alexander J, DO   Brief Narrative:  60 y.o. 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  He denies head trauma and loss of consciousness.  He endorses abdominal pain.  He told EMS that he did not want to go to the hospital and that he just wants to be left alone to die.  At bedside, he denies fever, chest pain, changes to his short baseline shortness of breath.  She denies dysuria and hematuria.  He endorses nausea, and denies vomiting.  He endorses to me that he drank alcohol 4 to 5 hours prior to presentation to the emergency department.  Denies SI and HI.  Presentation consistent with acute on chronic respiratory failure likely secondary to COPD/CHF.  Remains stepdown status.  Was on BiPAP since admission, taken off on 4/25.  Tolerating nasal cannula okay but increased work of breathing.  4/26: Patient apparently began to exhibit some withdrawal symptoms today.  Seen by palliative care.  Patient's birthday is in [birth year] and he wishes to live long enough to see his birthday.  He confirms DNR/DNI status but would like to treat the treatable.  He endorses some shoulder pain as well   Assessment & Plan:   Active Problems:   Centrilobular emphysema (HCC)   Sepsis (HCC)   Chronic atrial fibrillation (HCC)   HTN (hypertension)   Chronic gout  involving toe, unspecified cause, unspecified laterality   TBI (traumatic brain injury) (HCC)   Personal history of DVT (deep vein thrombosis)   Atrial fibrillation with RVR (HCC)   Major depressive disorder, recurrent episode, in partial remission (HCC)   HLD (hyperlipidemia)   Depression   Pressure ulcer   Alcohol abuse   Alcohol withdrawal syndrome without complication (HCC)   GAD (generalized anxiety disorder)   COPD (chronic obstructive pulmonary disease) (HCC)   Atrial fibrillation, chronic (HCC)   Class 3 obesity with alveolar hypoventilation, serious comorbidity, and body mass index (BMI) of 60.0 to 69.9 in adult [facility](HCC)   CHF (congestive heart failure) (HCC)   Weakness of both lower extremities   Physical deconditioning   Elevated lactic acid level   Unable to care for self  Fever Gout Possible cellulitis Atelectasis 5/1 and 5/2 mild fever. Patient does complain of polyarticular symptoms particularly feet and wrists which he says is consistent with one of his gout flares. Urinalysis not suggestive of infection, neither is CXR. Blood cultures ngtd. Skin fully examined with RN 5/2. There is some erythema and swelling and tenderness of LLE. This is most consistent w/ his known venous stasis dermatitis. CT of c/a/p unrevealing though does show atelectasis which could cause fever - d/c ceftriaxone - started prednisone for gout (started 5/2) - f/u cultures - incentive spirometry - monitor  Aerococcus bacteriuria Typically a contaminant but pt endorses dysuria. Discussed w/ pharm, nitrofurantoin typically covers - s/p 5 days macrobid  Chronic respiratory failure HFrEF EF 30-35 earlier this year. Requiring intermittent bipap. Currently on [place] O2. Obesity contributory. this morning awake and alert.  On 2-3 L chronically, is on that, acute respiratory failure is resolved. Presented w/ acute respiratory failure which is resolved but does require intermittent bipap. UOP 1800  yesterday - BiPAP as needed - Continue O2. - lasix at 60 oral qd  Atrial fibrillation with rapid ventricular response Multifactorial. Alcohol withdrawal versus acute intoxication. Has been weaned of diltiazem gtt and hr now appropriate - have increased metop to 100 bid - cont eliquis  Alcohol use Patient drinks half gallon of whiskey a day No signs withdrawal this morning - ativan prn - cont thiamine  Right sixth rib fracture Acute to subacute. Not complaining of pain - oxy prn for pain  Hypertension Controlled - cont lisinopril and metoprolol  Hypomagnesemia Hypokalemia 1.7 today - 2g mg and 80 meq kcl  Hyponatremia New, mild, improved to 133 today - monitor  Macrocytosis Likely 2/2 etoh. Folate and b12 wnl  Depression Duloxetine 20 mg daily  Hypothyroid tsh elevated, home compliance unclear - continue levothyroxine - will need repeat tsh in 4-6 wks  OSA/OHS Nightly BiPAP  Morbid obesity This complicates overall care and prognosis  Sacral decubitus ulcer Stage 1 on sacrum, stage 2 on thighs - RN wound care  Multiple medical comorbidities Failure to thrive Open aps case. Palliative involved, pt wants to treat the treatable. sw following. APS is involved and is pursuing guardianship. APS WANTS PATIENT KEPT HERE FOR TIME BEING.. Patient is at very high risk for readmission and he has demonstrated that he is unable to safely care for himself at home. It is my opinion that he would strongly benefit from skilled nursing care.   DVT prophylaxis: Eliquis Code Status: DNR Family Communication: sister [name] updated telephonically 4/28. Attempted to call today but with each attempt received message "your call cannot go through." Disposition Plan: Status is: Inpatient  Remains inpatient appropriate because:Inpatient level of care appropriate due to severity of illness   Dispo: The patient is from: Home              Anticipated d/c is to: snf                 Patient currently is not medically stable to d/c.   Difficult to place patient No  . Level of care: Progressive Cardiac  Consultants:   None   Procedures: None  Antimicrobials:   Nitrofurantoin 4/27>5/2  Vancomycin 5/2>   Subjective: Awake and alert. Says has pain in feet and wrists mildly improved from yesterday. No fevers.   Objective: Vitals:   06/16/20 2100 06/17/20 0600 06/17/20 0901 06/17/20 1117  BP: 106/77 (!) 118/54 119/64 132/80  Pulse: 98 80 (!) 103 93  Resp:  16 19 18   Temp: 98.6 F (37 C) 99.5 F (37.5 C) 97.7 F (36.5 C) 97.8 F (36.6 C)  TempSrc:  Oral    SpO2:   100% 100%  Weight:  (!) 173.9 kg    Height:        Intake/Output Summary (Last 24 hours) at 06/17/2020 1143 Last data filed at 06/17/2020 0901 Gross per 24 hour  Intake 100 ml  Output 1200 ml  Net -1100 ml   Filed Weights   06/15/20 0554 06/16/20 0343 06/17/20 0600  Weight: (!) 172.8 kg (!) 176.6 kg (!) 173.9 kg    Examination:  General exam: NAD Respiratory system: rr, scattered crackles,  Cardiovascular system:  irregular rhythm, no murmurs Gastrointestinal system: Obese, nontender, nondistended, normal bowel sounds Central nervous system: Alert and oriented, lethargic, no focal deficits  extremities: Diffusely decreased power bilaterally. Skin: erythema b/l shins, no fluctuance, some tenderness. Stage 1 sacral decub, stage 2 mild b/l thighs Psychiatry: Judgement and insight appear impaired. Mood & affect flattened.     Data Reviewed: I have personally reviewed following labs and imaging studies  CBC: Recent Labs  Lab 06/14/20 0704 06/15/20 0419 06/15/20 1758 06/16/20 0559 06/17/20 0507  WBC 6.4 6.0 6.4 7.9 6.6  NEUTROABS  --   --  4.2  --   --   HGB 11.6* 12.1* 11.3* 11.1* 11.1*  HCT 37.5* 39.1 36.2* 34.5* 33.2*  MCV 103.6* 100.8* 100.6* 99.1 96.2  PLT 149* 170 198 181 199   Basic Metabolic Panel: Recent Labs  Lab 06/13/20 0550 06/14/20 0704 06/15/20 0419  06/15/20 1758 06/16/20 0559 06/17/20 0507  NA 136 135 137 133* 132* 133*  K 3.9 3.8 3.5 3.9 3.5 3.1*  CL 82* 76* 76* 75* 77* 78*  CO2 43* 49* 48* 44* 44* 42*  GLUCOSE 104* 134* 106* 142* 147* 101*  BUN 17 13 20 20 18 16   CREATININE 0.65 0.58* 0.60* 0.63 0.58* 0.50*  CALCIUM 8.7* 9.0 9.2 9.1 8.9 8.6*  MG 1.4* 1.4* 1.9  --  1.6* 1.7  PHOS 2.7 3.2 2.8  --  2.6 3.6   GFR: Estimated Creatinine Clearance: 155.5 mL/min (A) (by C-G formula based on SCr of 0.5 mg/dL (L)). Liver Function Tests: Recent Labs  Lab 06/15/20 1758  AST 28  ALT 27  ALKPHOS 70  BILITOT 1.0  PROT 6.2*  ALBUMIN 2.7*   No results for input(s): LIPASE, AMYLASE in the last 168 hours. No results for input(s): AMMONIA in the last 168 hours. Coagulation Profile: Recent Labs  Lab 06/15/20 1758  INR 1.4*   Cardiac Enzymes: No results for input(s): CKTOTAL, CKMB, CKMBINDEX, TROPONINI in the last 168 hours. BNP (last 3 results) No results for input(s): PROBNP in the last 8760 hours. HbA1C: No results for input(s): HGBA1C in the last 72 hours. CBG: Recent Labs  Lab 06/12/20 1147  GLUCAP 127*   Lipid Profile: No results for input(s): CHOL, HDL, LDLCALC, TRIG, CHOLHDL, LDLDIRECT in the last 72 hours. Thyroid Function Tests: No results for input(s): TSH, T4TOTAL, FREET4, T3FREE, THYROIDAB in the last 72 hours. Anemia Panel: Recent Labs    06/15/20 0419  VITAMINB12 530  FOLATE 12.6   Sepsis Labs: Recent Labs  Lab 06/15/20 1758 06/15/20 1800 06/16/20 0559 06/17/20 0507  PROCALCITON 0.87  --  0.71 0.49  LATICACIDVEN  --  1.0  --   --     Recent Results (from the past 240 hour(s))  Blood culture (routine single)     Status: None   Collection Time: 06/08/20  9:19 AM   Specimen: BLOOD  Result Value Ref Range Status   Specimen Description BLOOD RIGHT AC  Final   Special Requests   Final    BOTTLES DRAWN AEROBIC AND ANAEROBIC Blood Culture results may not be optimal due to an inadequate volume of  blood received in culture bottles   Culture   Final    NO GROWTH 5 DAYS Performed at [facility], [address] [place], [place] [address]    Report Status 06/13/2020 FINAL  Final  Urine culture     Status: Abnormal   Collection Time: 06/08/20  9:19 AM   Specimen: Urine, Random  Result Value Ref Range Status   Specimen Description   Final    URINE, RANDOM Performed at [facility], [address] [place], [place] [address]    Special Requests   Final    NONE Performed at [facility], [address] [place], [place] [address]    Culture (A)  Final    >=100,000 COLONIES/mL AEROCOCCUS URINAE 20,000 COLONIES/mL ENTEROCOCCUS FAECALIS Standardized susceptibility testing for this organism is not available. Performed at [facility], [address], [place], [place] [address]    Report Status 06/11/2020 FINAL  Final   Organism ID, Bacteria ENTEROCOCCUS FAECALIS (A)  Final      Susceptibility   Enterococcus faecalis - MIC*    AMPICILLIN <=2 SENSITIVE Sensitive     NITROFURANTOIN <=16 SENSITIVE Sensitive     VANCOMYCIN 1 SENSITIVE Sensitive     * 20,000 COLONIES/mL ENTEROCOCCUS FAECALIS  Resp Panel by RT-PCR (Flu A&B, Covid) Nasopharyngeal Swab     Status: None   Collection Time: 06/08/20  9:22 AM   Specimen: Nasopharyngeal Swab; Nasopharyngeal(NP) swabs in vial transport medium  Result Value Ref Range Status   SARS Coronavirus 2 by RT PCR NEGATIVE NEGATIVE Final    Comment: (NOTE) SARS-CoV-2 target nucleic acids are NOT DETECTED.  The SARS-CoV-2 RNA is generally detectable in upper respiratory specimens during the acute phase of infection. The lowest concentration of SARS-CoV-2 viral copies this assay can detect is 138 copies/mL. A negative result does not preclude SARS-Cov-2 infection and should not be used as the sole basis for treatment or other patient management decisions. A negative result may occur with  improper specimen  collection/handling, submission of specimen other than nasopharyngeal swab, presence of viral mutation(s) within the areas targeted by this assay, and inadequate number of viral copies(<138 copies/mL). A negative result must be combined with clinical observations, patient history, and epidemiological information. The expected result is Negative.  Fact Sheet for Patients:  BloggerCourse.com  Fact Sheet for Healthcare Providers:  SeriousBroker.it  This test is no t yet approved or cleared by the [place] FDA and  has been authorized for detection and/or diagnosis of SARS-CoV-2 by FDA under an Emergency Use Authorization (EUA). This EUA will remain  in effect (meaning this test can be used) for the duration of the COVID-19 declaration under Section 564(b)(1) of the Act, 21 U.S.C.section 360bbb-3(b)(1), unless the authorization is terminated  or revoked sooner.       Influenza A by PCR NEGATIVE NEGATIVE Final   Influenza B by PCR NEGATIVE NEGATIVE Final    Comment: (NOTE) The Xpert Xpress SARS-CoV-2/FLU/RSV plus assay is intended as an aid in the diagnosis of influenza from Nasopharyngeal swab specimens and should not be used as a sole basis for treatment. Nasal washings and aspirates are unacceptable for Xpert Xpress SARS-CoV-2/FLU/RSV testing.  Fact Sheet for Patients: BloggerCourse.com  Fact Sheet for Healthcare Providers: SeriousBroker.it  This test is not yet approved or cleared by the [place] FDA and has been authorized for detection and/or diagnosis of SARS-CoV-2 by FDA under an Emergency Use Authorization (EUA). This EUA will remain in effect (meaning this test can be used) for the duration of the COVID-19 declaration under Section 564(b)(1) of the Act, 21 U.S.C. section 360bbb-3(b)(1), unless the authorization is terminated or revoked.  Performed at [facility], [address] [place], [place] [address]   Culture, blood (single)     Status: None   Collection Time: 06/08/20  1:51 PM   Specimen: BLOOD  Result Value Ref Range Status   Specimen Description BLOOD LEFT ARM  Final   Special Requests  Final    BOTTLES DRAWN AEROBIC AND ANAEROBIC Blood Culture results may not be optimal due to an excessive volume of blood received in culture bottles   Culture   Final    NO GROWTH 5 DAYS Performed at [facility], [address] [place], [place] [address]    Report Status 06/13/2020 FINAL  Final  MRSA PCR Screening     Status: Abnormal   Collection Time: 06/08/20  8:42 PM   Specimen: Nasopharyngeal  Result Value Ref Range Status   MRSA by PCR POSITIVE (A) NEGATIVE Final    Comment:        The GeneXpert MRSA Assay (FDA approved for NASAL specimens only), is one component of a comprehensive MRSA colonization surveillance program. It is not intended to diagnose MRSA infection nor to guide or monitor treatment for MRSA infections. RESULT CALLED TO, READ BACK BY AND VERIFIED WITH: NOTIFIED Reynolds Bowl RN 06/08/2020 2311 JG Performed at [facility], [address] [place], [place] [address]   CULTURE, BLOOD (ROUTINE X 2) w Reflex to ID Panel     Status: None (Preliminary result)   Collection Time: 06/15/20  5:59 PM   Specimen: BLOOD  Result Value Ref Range Status   Specimen Description BLOOD BLOOD LEFT HAND  Final   Special Requests   Final    BOTTLES DRAWN AEROBIC AND ANAEROBIC Blood Culture results may not be optimal due to an inadequate volume of blood received in culture bottles   Culture   Final    NO GROWTH 2 DAYS Performed at [facility], [address] [place], [place] [address]    Report Status PENDING  Incomplete  CULTURE, BLOOD (ROUTINE X 2) w Reflex to ID Panel     Status: None (Preliminary result)   Collection Time: 06/15/20  6:01 PM   Specimen: BLOOD  Result Value Ref Range  Status   Specimen Description BLOOD BLOOD RIGHT HAND  Final   Special Requests   Final    BOTTLES DRAWN AEROBIC ONLY Blood Culture results may not be optimal due to an inadequate volume of blood received in culture bottles   Culture   Final    NO GROWTH 2 DAYS Performed at [facility], [address], [place], [place] [address]    Report Status PENDING  Incomplete         Radiology Studies: CT CHEST ABDOMEN PELVIS W CONTRAST  Result Date: 06/16/2020 CLINICAL DATA:  Fever of unknown origin EXAM: CT CHEST, ABDOMEN, AND PELVIS WITH CONTRAST TECHNIQUE: Multidetector CT imaging of the chest, abdomen and pelvis was performed following the standard protocol during bolus administration of intravenous contrast. CONTRAST:  OMNIPAQUE IOHEXOL 300 MG/ML  SOLN COMPARISON:  06/08/2020 FINDINGS: CT CHEST FINDINGS Cardiovascular: Thoracic aorta and its branches demonstrate mild atherosclerotic calcification. No aneurysmal dilatation or dissection is seen. Heart is mildly enlarged but stable. Coronary calcifications are noted. Although not timed for embolus evaluation common no large central pulmonary embolus is seen. Mediastinum/Nodes: Thoracic inlet is within normal limits. Mild mediastinal lipomatosis is noted. No sizable hilar or mediastinal adenopathy is noted. The esophagus is within normal limits. Lungs/Pleura: Scattered atelectatic changes are noted throughout both lungs without focal confluent infiltrate. These changes have worsened in the interval from the prior exam. No sizable parenchymal nodule is seen. No sizable pleural effusion is noted. Musculoskeletal: Degenerative change of the thoracic spine is noted. Healing right anterior sixth rib fracture is noted. CT ABDOMEN PELVIS FINDINGS Hepatobiliary: Mild  fatty infiltration of the liver is noted. Gallbladder is within normal limits. Pancreas: Unremarkable. No pancreatic ductal dilatation or surrounding inflammatory changes. Spleen:  Normal in size without focal abnormality. Adrenals/Urinary Tract: Adrenal glands are within normal limits. Kidneys demonstrate a normal enhancement pattern bilaterally. A few scattered hypodensities are noted most consistent with renal cysts. No obstructive changes are seen. No renal calculi are noted. The bladder is decompressed. Stomach/Bowel: Scattered diverticular changes noted within the colon. No evidence of diverticulitis is seen. No obstructive changes of the colon are noted. The appendix is within normal limits. The small bowel and stomach are unremarkable. Vascular/Lymphatic: Aortic atherosclerosis. No enlarged abdominal or pelvic lymph nodes. Reproductive: Prostate is unremarkable. Other: No abdominal wall hernia or abnormality. No abdominopelvic ascites. Musculoskeletal: Degenerative changes of lumbar spine and right hip joint are noted. No compression deformities are seen. IMPRESSION: CT of the chest: New scattered atelectatic changes bilaterally. This may contribute to the patient's underlying fever. No associated effusion is seen. No other acute abnormality is noted in the chest. CT of the abdomen and pelvis: No acute abnormality is noted. Diverticular change without evidence of diverticulitis is noted. Scattered renal cysts are seen. Aortic Atherosclerosis (ICD10-I70.0). Electronically Signed   By: Alcide Clever M.D.   On: 06/16/2020 15:12   DG Chest Port 1 View  Result Date: 06/15/2020 CLINICAL DATA:  Fever, history of COPD, asthma EXAM: PORTABLE CHEST 1 VIEW COMPARISON:  06/08/2020 FINDINGS: Single frontal view of the chest demonstrates stable enlargement of the cardiac silhouette. Chronic central vascular congestion without airspace disease, effusion, or pneumothorax. No acute bony abnormalities. IMPRESSION: 1. Stable exam, no acute process. Electronically Signed   By: Sharlet Salina M.D.   On: 06/15/2020 16:58        Scheduled Meds: . apixaban  5 mg Oral BID  . aspirin  81 mg Oral  Daily  . atorvastatin  40 mg Oral Daily  . Chlorhexidine Gluconate Cloth  6 each Topical Daily  . DULoxetine  20 mg Oral Daily  . folic acid  1 mg Oral Daily  . furosemide  60 mg Oral Daily  . gabapentin  300 mg Oral TID  . levothyroxine  75 mcg Oral QAC breakfast  . lisinopril  2.5 mg Oral Daily  . methocarbamol  750 mg Oral TID  . metoprolol tartrate  100 mg Oral BID  . montelukast  10 mg Oral QHS  . multivitamin with minerals  1 tablet Oral Daily  . polyethylene glycol  17 g Oral Daily  . predniSONE  40 mg Oral Q breakfast  . Ensure Max Protein  11 oz Oral TID WC  . thiamine  100 mg Oral Daily   Or  . thiamine  100 mg Intravenous Daily   Continuous Infusions: . cefTRIAXone (ROCEPHIN)  IV 2 g (06/17/20 1022)     LOS: 9 days    Time spent: 25 minutes    Silvano Bilis, MD [facility]  If 7PM-7AM, please contact night-coverage 06/17/2020, 11:43 AM

## 2020-06-17 NOTE — Progress Notes (Signed)
PT Cancellation Note  Patient Details Name: [name] [name] MRN: [id number] DOB: 02-May-1960   Cancelled Treatment:    Reason Eval/Treat Not Completed: Other (comment) Pt laying in bed on BiPAP, attempted to engage him to work with PT.  He c/o too much gout pain in hands and feet and is not interested in even light in-bed activities at this time.  Apparently he was able to sit at E[facility] this AM (which is more than he has done in a while).  Will maintain on PT caseload and we will continue to see him as appropriate.    Malachi Pro, DPT 06/17/2020, 1:00 PM

## 2020-06-17 NOTE — Progress Notes (Signed)
Pt self-initiates Bipap when he is ready to go on it.

## 2020-06-17 NOTE — TOC Progression Note (Addendum)
Transition of Care [facility]) - Progression Note    Patient Details  Name: [name] [name] MRN: [id number] Date of Birth: [birth year]  Transition of [facility]) CM/SW Contact  Eileen Stanford, LCSW Phone Number: 06/17/2020, 9:34 AM  Clinical Narrative:   CSW met with Social Worker DeeDee Falson on the unit. DeeDee states that DSS is going to private pay for pt to go to SNF therefore pt will keep his Medicaid check. DeeDee provided CSW with her number (office: [phone], cell: [phone]) to follow up with bed offers. Pt referral fax out to several SNFS.    Expected Discharge Plan: Skilled Nursing Facility Barriers to Discharge: Continued Medical Work up  Expected Discharge Plan and Services Expected Discharge Plan: [place] Choice: [place] arrangements for the past 2 months: Apartment                   DME Agency: [place] Date DME Agency Contacted: 06/12/20 Time DME Agency Contacted: [phone] Representative spoke with at DME Agency: Meredeth Ide reporesentative stated they woudl not continue services for the patient due to intoxication.             Social Determinants of Health (SDOH) Interventions    Readmission Risk Interventions Readmission Risk Prevention Plan 01/11/2020 12/24/2019 01/25/2019  Transportation Screening Complete Complete Complete  PCP or Specialist Appt within 3-5 Days - - -  HRI or [place] for [place] - - -  Medication Review Press photographer) Complete Complete Complete  PCP or Specialist appointment within 3-5 days of discharge Complete Complete Complete  HRI or [place] - - Complete  SW Recovery Care/Counseling Consult Complete Complete Complete  Palliative Care Screening Complete Not Applicable Not [place] Not Applicable Not Applicable Not  Applicable

## 2020-06-17 NOTE — Progress Notes (Signed)
Occupational Therapy Treatment Patient Details Name: [name] [name] MRN: [id number] DOB: 1960-04-13 Today's Date: 06/17/2020    History of present illness Pt is a 60 y.o. 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  MD assessment includes: SIRS, acute on chronic respiratory failure, A-fib with RVR, alcohol withdrawal, R 6th rib Fx, and FTT.   OT comments  Upon entering the room, pt supine in bed and sleeping soundly with BiPAP donned. Pt initially refusing OT intervention but then agreeable. BiPAP removed and pt placed on 5 L O2 via [place]. Pt's with no signs of distress. He declined OOB and EOB activities. Pt performed 5 reps each B LEs hip abduction/adduction exercises with encouragement. Pt requesting something to drink and needs assistance to open milk container. Pt holding with B UEs secondary to tremors in hands. Pt then assisted with repositioning. Bed tilted into trendelenburg and pt utilized B UEs on bed rails and pushing thru B LEs to bridge x 3 to pull self up. Pt reports being in comfortable position and requesting to rest. BiPAP placed back on per pt request. RN notified.   Follow Up Recommendations  SNF    Equipment Recommendations  Other (comment) (defer to next venue of care)       Precautions / Restrictions Precautions Precautions: Fall Restrictions Weight Bearing Restrictions: No       Mobility Bed Mobility               General  bed mobility comments: Pt able to bridge and use UEs on bed rails to pull self up in bed with trendelenburg bed position    Transfers                 General transfer comment: pt refused    Balance       Sitting balance - Comments: pt refusal                                   ADL either performed or assessed with clinical judgement        Vision Patient Visual Report: No change from baseline            Cognition Arousal/Alertness: Awake/alert Behavior During Therapy: WFL for tasks assessed/performed Overall Cognitive Status: Within Functional Limits for tasks assessed                                 General Comments: Pt was oriented x 2. likes to use humor to cover cognitive deficits. Pt was surprised to find out it is now May.                   Pertinent Vitals/ Pain       Pain Assessment: Faces Faces Pain Scale: Hurts little more Pain Location: generalized "all over" Pain Descriptors / Indicators: Discomfort Pain Intervention(s): Limited activity within patient's tolerance;Repositioned;Monitored during session         Frequency  Min 2X/week  Progress Toward Goals  OT Goals(current goals can now be found in the care plan section)  Progress towards OT goals: Progressing toward goals  Acute Rehab OT Goals Patient Stated Goal: none stated OT Goal Formulation: With patient Time For Goal Achievement: 06/26/20 Potential to Achieve Goals: Good  Plan Discharge plan remains appropriate       AM-PAC OT "6 Clicks" Daily Activity     Outcome Measure   Help from another person eating meals?: A Little Help from another person taking care of personal grooming?: A Little Help from another person toileting, which includes using toliet, bedpan, or urinal?: Total Help from another person bathing (including washing, rinsing, drying)?: A Lot Help from another person to put on and taking off regular upper body clothing?:  A Little Help from another person to put on and taking off regular lower body clothing?: A Lot 6 Click Score: 14    End of Session Equipment Utilized During Treatment: Oxygen  OT Visit Diagnosis: Unsteadiness on feet (R26.81);Repeated falls (R29.6);Muscle weakness (generalized) (M62.81)   Activity Tolerance Patient limited by fatigue   Patient Left in bed;with call bell/phone within reach;with nursing/sitter in room   Nurse Communication Mobility status        Time: 7209-4709 OT Time Calculation (min): 26 min  Charges: OT General Charges $OT Visit: 1 Visit OT Treatments $Therapeutic Activity: 8-22 mins $Therapeutic Exercise: 8-22 mins  Jackquline Denmark, MS, OTR/L , CBIS ascom [phone]  06/17/20, 4:02 PM

## 2020-06-17 NOTE — Progress Notes (Signed)
Nutrition Follow-up  DOCUMENTATION CODES:   Morbid obesity  INTERVENTION:   Ensure Max protein supplement TID, each supplement provides 150kcal and 30g of protein.  Magic cup TID with meals, each supplement provides 290 kcal and 9 grams of protein  MVI po daily   Vitamin C 570m po BID  Liberalize diet   NUTRITION DIAGNOSIS:   Increased nutrient needs related to wound healing as evidenced by increased estimated needs.  GOAL:   Patient will meet greater than or equal to 90% of their needs  -progressing   MONITOR:   PO intake,Supplement acceptance,Labs,Weight trends,Skin,I & O's  ASSESSMENT:   60y.o. male with medical history significant for morbid obesity, chronic hypoxic respiratory failure, obesity hypoventilation syndrome, hypertension, depression, anxiety, alcohol use disorder, left ear deafness since the age of 21 COPD, history of nonischemic cardiomyopathy, heart failure reduced ejection fraction, persistent atrial fibrillation, sleep apnea, DVT, GERD, hypothyroid, hyperlipidemia, bilateral segmental and subsegmental pulmonary emboli and chronic bilateral lower extremity venous stasis who presents to the emergency department for chief concerns of having a fall  Met with pt in room today. Pt on bipap at time of RD visit. Pt has continued to have decreased appetite and oral intake in hospital but it does appear to be improving. For lunch today patient ate two serving of grapes, drank two cartons of 2% milk, ate 50% of a chocolate pudding and 100% of a piece of cake (495kcal and 19g protein). Spoke with RN, who reports that patient drinks better than he eats. Pt has been drinking two cartons of milk with each meal. Pt has also been drinking most of his Ensure Max protein. Per chart, pt is down 27lbs(7%) since admit; majority of pt's weight loss is likely from fluid changes. RD will liberalize the heart healthy portion of patient's diet as this is restrictive of protein. RD will  also add Magic Cups to meal trays. RD will add vitamin C to support wound healing. Palliative care following for GOC. Plan is for SNF at discharge.   Medications reviewed and include: aspirin, lasix, synthroid, MVI, miralax, KCl, prednisone, thiamine, ceftriaxone, Mg sulfate    Labs reviewed: Na 133(L), creat 0.50(L), P 3.6 wnl, Mg 1.7 wnl  Diet Order:   Diet Order            Diet Carb Modified Fluid consistency: Thin; Room service appropriate? 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  Estimated Nutritional Needs:   Kcal:  3000-3300kcal/day  Protein:  140-150g/day  Fluid:  2.2-2.5L/day  CKoleen DistanceMS, RD, LDN Please refer to [facility] and/or RD on-call/weekend/after hours pager

## 2020-06-18 DIAGNOSIS — I482 Chronic atrial fibrillation, unspecified: Secondary | ICD-10-CM | POA: Diagnosis not present

## 2020-06-18 DIAGNOSIS — F1010 Alcohol abuse, uncomplicated: Secondary | ICD-10-CM | POA: Diagnosis not present

## 2020-06-18 DIAGNOSIS — I4891 Unspecified atrial fibrillation: Secondary | ICD-10-CM | POA: Diagnosis not present

## 2020-06-18 LAB — SEDIMENTATION RATE: Sed Rate: 70 mm/hr — ABNORMAL HIGH (ref 0–20)

## 2020-06-18 LAB — CBC
HCT: 31.8 % — ABNORMAL LOW (ref 39.0–52.0)
Hemoglobin: 10.5 g/dL — ABNORMAL LOW (ref 13.0–17.0)
MCH: 31.8 pg (ref 26.0–34.0)
MCHC: 33 g/dL (ref 30.0–36.0)
MCV: 96.4 fL (ref 80.0–100.0)
Platelets: 258 10*3/uL (ref 150–400)
RBC: 3.3 MIL/uL — ABNORMAL LOW (ref 4.22–5.81)
RDW: 16.3 % — ABNORMAL HIGH (ref 11.5–15.5)
WBC: 6.8 10*3/uL (ref 4.0–10.5)
nRBC: 0 % (ref 0.0–0.2)

## 2020-06-18 LAB — BASIC METABOLIC PANEL
Anion gap: 11 (ref 5–15)
BUN: 33 mg/dL — ABNORMAL HIGH (ref 6–20)
CO2: 40 mmol/L — ABNORMAL HIGH (ref 22–32)
Calcium: 8.6 mg/dL — ABNORMAL LOW (ref 8.9–10.3)
Chloride: 80 mmol/L — ABNORMAL LOW (ref 98–111)
Creatinine, Ser: 0.69 mg/dL (ref 0.61–1.24)
GFR, Estimated: >60 mL/min (ref >60)
Glucose, Bld: 135 mg/dL — ABNORMAL HIGH (ref 70–99)
Potassium: 4.2 mmol/L (ref 3.5–5.1)
Sodium: 131 mmol/L — ABNORMAL LOW (ref 135–145)

## 2020-06-18 LAB — MAGNESIUM: Magnesium: 1.9 mg/dL (ref 1.7–2.4)

## 2020-06-18 LAB — URIC ACID: Uric Acid, Serum: 6.5 mg/dL (ref 3.7–8.6)

## 2020-06-18 LAB — PHOSPHORUS: Phosphorus: 5.1 mg/dL — ABNORMAL HIGH (ref 2.5–4.6)

## 2020-06-18 MED ORDER — TRAZODONE HCL 50 MG PO TABS
150.0000 mg | ORAL_TABLET | Freq: Every evening | ORAL | Status: DC | PRN
  Administered 2020-06-18 – 2020-06-21 (×4): 150 mg via ORAL
  Filled 2020-06-18 (×4): qty 1

## 2020-06-18 MED ORDER — COLCHICINE 0.6 MG PO TABS
0.6000 mg | ORAL_TABLET | Freq: Every day | ORAL | Status: DC
  Administered 2020-06-18 – 2020-06-29 (×12): 0.6 mg via ORAL
  Filled 2020-06-18 (×12): qty 1

## 2020-06-18 NOTE — Progress Notes (Signed)
Physical Therapy Treatment Patient Details Name: [name] [name] MRN: [id number] DOB: [birth year] Today's Date: 06/18/2020    History of Present Illness Pt is a 60 y.o. male with medical history significant for morbid obesity, chronic hypoxic respiratory failure, obesity hypoventilation syndrome, hypertension, depression, anxiety, alcohol use disorder, Left ear deafness since the age of 30, COPD, history of nonischemic cardiomyopathy, heart failure reduced ejection fraction, persistent atrial fibrillation, sleep apnea, history of DVT, GERD, hypothyroid, hyperlipidemia, history of bilateral segmental and subsegmental pulmonary emboli, chronic bilateral lower extremity venous stasis, presents to the emergency department for chief concerns of having a fall.  MD assessment includes: SIRS, acute on chronic respiratory failure, A-fib with RVR, alcohol withdrawal, R 6th rib Fx, and FTT.    PT Comments    Pt in good spirits and eager to work with PT.  He did quite well with mobility, needing only min assist and showing great effort (even with scooting up in bed after standing efforts).  He struggled to rise from standard height bed even with assist, but at ~3" elevation he did attain standing with only min assist, and heavy UE use.  He managed to take a few shuffling/side steps with heavy walker reliance, but 2/2 severe R foot pain (gout) he would not hazard any steps away from bed or trip to the recliner.  Good effort, improving (slowly) and pt is eager to get to rehab and start working on his strength and ambulation tolerance.    Follow Up Recommendations  SNF;Supervision/Assistance - 24 hour     Equipment Recommendations  Rolling walker with 5" wheels    Recommendations for Other Services       Precautions / Restrictions Precautions Precautions: Fall Restrictions Weight Bearing Restrictions: No    Mobility  Bed Mobility Overal bed mobility: Needs Assistance Bed Mobility: Supine to Sit;Sit to  Supine     Supine to sit: Min assist Sit to supine: Min assist   General bed mobility comments: Pt did quite well with bed mobility this date, needed light assist with LEs but good effort w/o heavy assist    Transfers Overall transfer level: Needs assistance Equipment used: Rolling walker (2 wheeled) Transfers: Sit to/from Stand Sit to Stand: From elevated surface;Min assist         General transfer comment: initial attempts from standard height and ~2" raised bed were unsuccessful even with light assist.  attempt ~3" elevated with direct assist was successful, heavily reliant on the walker  Ambulation/Gait Ambulation/Gait assistance: Min assist Gait Distance (Feet): 2 Feet Assistive device: Rolling walker (2 wheeled)       General Gait Details: Pt was able to take a few very hesitant, guarded highly UE reliant steps but c/o too much pain in R foot (gout) to attempt much more.  Good effort, pain limited, HR and O2 (on 6L) stable t/o the effort   Stairs             Wheelchair Mobility    Modified Rankin (Stroke Patients Only)       Balance Overall balance assessment: Needs assistance Sitting-balance support: No upper extremity supported Sitting balance-Leahy Scale: Good     Standing balance support: Bilateral upper extremity supported Standing balance-Leahy Scale: Fair Standing balance comment: poor tolerance 2/2 foot pain (R>L) but able to maintain balance with UEs/walker with minimally dynamic EOB tasks  Cognition Arousal/Alertness: Awake/alert Behavior During Therapy: WFL for tasks assessed/performed Overall Cognitive Status: Within Functional Limits for tasks assessed                                        Exercises General Exercises - Lower Extremity Ankle Circles/Pumps: AROM;Strengthening;10 reps Heel Slides: AAROM;Strengthening;10 reps (R weaker and needing more assist than L) Hip  ABduction/ADduction: AAROM;Strengthening;10 reps (R weaker and needing more assist than L)    General Comments        Pertinent Vitals/Pain Pain Assessment: 0-10 Pain Score: 8  Pain Location: R foot > L    Home Living                      Prior Function            PT Goals (current goals can now be found in the care plan section) Progress towards PT goals: Progressing toward goals    Frequency    Min 2X/week      PT Plan Current plan remains appropriate    Co-evaluation              AM-PAC PT "6 Clicks" Mobility   Outcome Measure  Help needed turning from your back to your side while in a flat bed without using bedrails?: A Little Help needed moving from lying on your back to sitting on the side of a flat bed without using bedrails?: A Little Help needed moving to and from a bed to a chair (including a wheelchair)?: A Lot Help needed standing up from a chair using your arms (e.g., wheelchair or bedside chair)?: A Lot Help needed to walk in hospital room?: Total Help needed climbing 3-5 steps with a railing? : Total 6 Click Score: 12    End of Session Equipment Utilized During Treatment: Oxygen Activity Tolerance: Patient limited by fatigue;Patient limited by lethargy Patient left: in bed;with call bell/phone within reach;with nursing/sitter in room Nurse Communication: Mobility status PT Visit Diagnosis: Muscle weakness (generalized) (M62.81);Difficulty in walking, not elsewhere classified (R26.2)     Time: 6213-0865 PT Time Calculation (min) (ACUTE ONLY): 31 min  Charges:  $Therapeutic Exercise: 8-22 mins $Therapeutic Activity: 8-22 mins                     Malachi Pro, DPT 06/18/2020, 4:20 PM

## 2020-06-18 NOTE — Progress Notes (Signed)
[facility]                                                                              Patient Demographics  [name] [name], is a 60 y.o. male, DOB - 13-Jan-1961, WUJ:[id number]  Admit date - 06/08/2020   Admitting Physician Amy August Luz, DO  Outpatient Primary MD for the patient is [name], DO  Outpatient specialists:   LOS - 10  days   Medical records reviewed and are as summarized below:    Chief Complaint  Patient presents with  . 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 fib, OSA, history of DVT, GERD, hypothyroid, hyperlipidemia, history of PE, chronic bilateral lower extremity venous stasis, presented with a fall.  Denied any loss of consciousness or hitting his head.  Reported abdominal pain.He told EMS that he did not want to go to the hospital and that he just wants to be left alone to die. On admission, reported nausea, baseline shortness of breath, alcohol use 4 to 5 hours prior to presentation.  Denied any SI and HI.[name] Presentation consistent with acute on chronic respiratory failure likely secondary to COPD/CHF.  Remains stepdown status.  Was on BiPAP since admission, taken off on 4/25, now tolerating O2 via [place].  Patient has been seen by palliative medicine.  DNR/DNI, would like to treat the treatable.    Assessment & Plan   Acute on chronic respiratory failure with hypoxia with underlying COPD, OSA, OHS -Intermittently requiring BiPAP, O2 sats 91 to 99% on 5 L -Continue O2, BiPAP as needed.  Outpatient on 2 to 3 L chronically. - echo 04/07/2020 had shown EF of 30 to 95%, diastolic parameters normal   Acute on chronic systolic CHF exacerbation -2D echo 04/07/2020 had shown EF of 30 to 62%,  diastolic parameters normal -Continue Lasix 60 mg p.o. daily, beta-blocker -Continue strict I's and O's and daily weights, negative balance of 4.4 L  Acute gout, possible cellulitis, atelectasis -On 5/1 and 5/2, patient had mild fever with bilateral feet and wrist pain, states consistent with his gout flares -Blood cultures negative to date, some erythema and tenderness of left lower extremity, has venous stasis dermatitis -Follow ESR, CRP, uric acid -Patient was started on prednisone for gout on 5/2, add colchicine    Aerococcus bacteriuria -Typically a contaminant but pt endorses dysuria. Dr Si Raider discussed w/ pharm, nitrofurantoin typically covers - Received 5 days of Macrobid  Atrial fibrillation with RVR -Likely due to acute alcohol withdrawal, has been weaned off of Cardizem drip -Heart rate controlled, continue metoprolol, eliquis  Alcohol abuse -Drinks half a gallon of whiskey daily, currently no alcohol withdrawals Continue thiamine, Ativan as needed  Right sixth rib fracture Likely due to fall, acute to subacute Continue pain control as needed  Essential hypertension Stable, continue beta-blocker  Hypokalemia -Resolved  Hyponatremia -Continue monitoring, sodium 131  Macrocytosis -Likely due to alcohol use, B12 folate normal  Depression Continue duloxetine  Hypothyroidism TSH 11.4, continue levothyroxine Compliance unclear, will need repeat TSH in 4 weeks.  OSA, OHS, morbid obesity Continue BiPAP qhs  Failure to thrive -APS involved and pursuing guardianship.  APS wants patient to remain here for the time being.  Unable to safely care for himself at home.  Will need skilled nursing care, high risk for readmissions.  Pressure injury -Right thigh, left posterior thigh stage II, present on admission -stage 2 sacrum, POA     Morbid obesity  Estimated body mass index is 58.78 kg/m as calculated from the following:   Height as of this encounter: 5'  8" (1.727 m).   Weight as of this encounter: 175.4 kg.  Code Status: dnr DVT Prophylaxis:   apixaban (ELIQUIS) tablet 5 mg   Level of Care: Level of care: Progressive Cardiac Family Communication: Discussed all imaging results, lab results, explained to the patient   Disposition Plan:     Status is: Inpatient  Remains inpatient appropriate because:Inpatient level of care appropriate due to severity of illness   Dispo: The patient is from: Home              Anticipated d/c is to: SNF              Patient currently is not medically stable to d/c.   Difficult to place patient Yes      Time Spent in minutes   36mns    Procedures:  None   Consultants:   Palliative medicine  Antimicrobials:   Anti-infectives (From admission, onward)   Start     Dose/Rate Route Frequency Ordered Stop   06/16/20 1000  nitrofurantoin (macrocrystal-monohydrate) (MACROBID) capsule 100 mg        100 mg Oral Every 12 hours 06/16/20 0843 06/16/20 2209   06/16/20 1000  cefTRIAXone (ROCEPHIN) 2 g in sodium chloride 0.9 % 100 mL IVPB        2 g 200 mL/hr over 30 Minutes Intravenous Every 24 hours 06/16/20 0843     06/11/20 1200  nitrofurantoin (macrocrystal-monohydrate) (MACROBID) capsule 100 mg  Status:  Discontinued        100 mg Oral Every 12 hours 06/11/20 1026 06/16/20 0843   06/09/20 0300  vancomycin (VANCOREADY) IVPB 1750 mg/350 mL  Status:  Discontinued        1,750 mg 175 mL/hr over 120 Minutes Intravenous Every 12 hours 06/08/20 1610 06/09/20 0800   06/08/20 2200  metroNIDAZOLE (FLAGYL) IVPB 500 mg  Status:  Discontinued       Note to Pharmacy: He got one dose in the ED already. Please schedule appropriately.   500 mg 100 mL/hr over 60 Minutes Intravenous Every 8 hours 06/08/20 1436 06/09/20 0800   06/08/20 2000  ceFEPIme (MAXIPIME) 2 g in sodium chloride 0.9 % 100 mL IVPB  Status:  Discontinued        2 g 200 mL/hr over 30 Minutes Intravenous Every 8 hours 06/08/20 1447 06/09/20 0800    06/08/20 1445  metroNIDAZOLE (FLAGYL) IVPB 500 mg  Status:  Discontinued       Note to Pharmacy: He got one dose in the ED already. Please schedule appropriately.   500 mg 100 mL/hr over 60 Minutes Intravenous Every 8 hours 06/08/20 1434 06/08/20 1436   06/08/20 1445  vancomycin (VANCOCIN) IVPB 1000 mg/200 mL premix       "Followed by" Linked Group Details   1,000 mg 200 mL/hr over 60 Minutes Intravenous  Once 06/08/20 1441  06/08/20 1633   06/08/20 1445  vancomycin (VANCOREADY) IVPB 1500 mg/300 mL  Status:  Discontinued       "Followed by" Linked Group Details   1,500 mg 150 mL/hr over 120 Minutes Intravenous  Once 06/08/20 1441 06/09/20 0717   06/08/20 1100  ceFEPIme (MAXIPIME) 2 g in sodium chloride 0.9 % 100 mL IVPB        2 g 200 mL/hr over 30 Minutes Intravenous  Once 06/08/20 1047 06/08/20 1331   06/08/20 1100  metroNIDAZOLE (FLAGYL) IVPB 500 mg        500 mg 100 mL/hr over 60 Minutes Intravenous  Once 06/08/20 1047 06/08/20 1529   06/08/20 1100  vancomycin (VANCOCIN) IVPB 1000 mg/200 mL premix  Status:  Discontinued        1,000 mg 200 mL/hr over 60 Minutes Intravenous  Once 06/08/20 1047 06/08/20 1441          Medications  Scheduled Meds: . apixaban  5 mg Oral BID  . vitamin C  500 mg Oral BID  . aspirin  81 mg Oral Daily  . atorvastatin  40 mg Oral Daily  . Chlorhexidine Gluconate Cloth  6 each Topical Daily  . DULoxetine  20 mg Oral Daily  . furosemide  60 mg Oral Daily  . gabapentin  300 mg Oral TID  . levothyroxine  75 mcg Oral QAC breakfast  . lisinopril  2.5 mg Oral Daily  . methocarbamol  750 mg Oral TID  . metoprolol tartrate  100 mg Oral BID  . montelukast  10 mg Oral QHS  . multivitamin with minerals  1 tablet Oral Daily  . polyethylene glycol  17 g Oral Daily  . predniSONE  40 mg Oral Q breakfast  . Ensure Max Protein  11 oz Oral TID WC  . thiamine  100 mg Oral Daily   Or  . thiamine  100 mg Intravenous Daily   Continuous Infusions: .  cefTRIAXone (ROCEPHIN)  IV 2 g (06/18/20 1031)   PRN Meds:.acetaminophen **OR** acetaminophen, albuterol, alum & mag hydroxide-simeth, HYDROmorphone (DILAUDID) injection, LORazepam, meclizine, metoprolol tartrate, midodrine, nitroGLYCERIN, ondansetron **OR** ondansetron (ZOFRAN) IV, oxyCODONE      Subjective:   [name] [name] was seen and examined today.  Alert and awake, oriented, states pain in feet and wrists, right shoulder and everywhere, has severe arthritis.  No fevers or chills.  No abdominal pain nausea or vomiting, diarrhea.  No chest pain.. No acute events overnight.    Objective:   Vitals:   06/17/20 1620 06/17/20 1944 06/18/20 0432 06/18/20 1017  BP: 95/78 93/62 101/68 113/70  Pulse: 71 64 81 85  Resp: _0 Temp: 97.9 F (36.6 C) 98.6 F (37 C) 98.6 F (37 C)   TempSrc:      SpO2: 91% 91% 99%   Weight:   (!) 175.4 kg   Height:        Intake/Output Summary (Last 24 hours) at 06/18/2020 1327 Last data filed at 06/18/2020 0600 Gross per 24 hour  Intake 339 ml  Output 850 ml  Net -511 ml     Wt Readings from Last 3 Encounters:  06/18/20 (!) 175.4 kg  05/16/20 (!) 188.1 kg  04/30/20 (!) 186 kg     Exam  General: Alert and oriented x 3, NAD, ill-appearing  Cardiovascular: S1 S2 auscultated, no murmurs, RRR  Respiratory: Diminished breath sounds bases  Gastrointestinal: Soft, nontender, nondistended, + bowel sounds  Ext: bilateral lower extremities wrapped  Neuro: generalized debility  Musculoskeletal: No digital cyanosis, clubbing  Skin: Stage I sacral decub, stage II bilateral thighs  Psych: flat affect   Data Reviewed:  I have personally reviewed following labs and imaging studies  Micro Results Recent Results (from the past 240 hour(s))  Culture, blood (single)     Status: None   Collection Time: 06/08/20  1:51 PM   Specimen: BLOOD  Result Value Ref Range Status   Specimen Description BLOOD LEFT ARM  Final   Special Requests    Final    BOTTLES DRAWN AEROBIC AND ANAEROBIC Blood Culture results may not be optimal due to an excessive volume of blood received in culture bottles   Culture   Final    NO GROWTH 5 DAYS Performed at [facility], [address] [place], [place] [address]    Report Status 06/13/2020 FINAL  Final  MRSA PCR Screening     Status: Abnormal   Collection Time: 06/08/20  8:42 PM   Specimen: Nasopharyngeal  Result Value Ref Range Status   MRSA by PCR POSITIVE (A) NEGATIVE Final    Comment:        The GeneXpert MRSA Assay (FDA approved for NASAL specimens only), is one component of a comprehensive MRSA colonization surveillance program. It is not intended to diagnose MRSA infection nor to guide or monitor treatment for MRSA infections. RESULT CALLED TO, READ BACK BY AND VERIFIED WITH: NOTIFIED Laurin Coder RN 06/08/2020 [address] Performed at [facility], [address] [place], [place] [address]   CULTURE, BLOOD (ROUTINE X 2) w Reflex to ID Panel     Status: None (Preliminary result)   Collection Time: 06/15/20  5:59 PM   Specimen: BLOOD  Result Value Ref Range Status   Specimen Description BLOOD BLOOD LEFT HAND  Final   Special Requests   Final    BOTTLES DRAWN AEROBIC AND ANAEROBIC Blood Culture results may not be optimal due to an inadequate volume of blood received in culture bottles   Culture   Final    NO GROWTH 3 DAYS Performed at [facility], [address], [place], [place] [address]    Report Status PENDING  Incomplete  CULTURE, BLOOD (ROUTINE X 2) w Reflex to ID Panel     Status: None (Preliminary result)   Collection Time: 06/15/20  6:01 PM   Specimen: BLOOD  Result Value Ref Range Status   Specimen Description BLOOD BLOOD RIGHT HAND  Final   Special Requests   Final    BOTTLES DRAWN AEROBIC ONLY Blood Culture results may not be optimal due to an inadequate volume of blood received in culture bottles   Culture   Final    NO GROWTH 3  DAYS Performed at [facility], [address], [place], [place] [address]    Report Status PENDING  Incomplete    Radiology Reports CT CHEST ABDOMEN PELVIS W CONTRAST  Result Date: 06/16/2020 CLINICAL DATA:  Fever of unknown origin EXAM: CT CHEST, ABDOMEN, AND PELVIS WITH CONTRAST TECHNIQUE: Multidetector CT imaging of the chest, abdomen and pelvis was performed following the standard protocol during bolus administration of intravenous contrast. CONTRAST:  120m OMNIPAQUE IOHEXOL 300 MG/ML  SOLN COMPARISON:  06/08/2020 FINDINGS: CT CHEST FINDINGS Cardiovascular: Thoracic aorta and its branches demonstrate mild atherosclerotic calcification. No aneurysmal dilatation or dissection is seen. Heart is mildly enlarged but stable. Coronary calcifications are noted. Although not timed for embolus evaluation common no large central pulmonary embolus is  seen. Mediastinum/Nodes: Thoracic inlet is within normal limits. Mild mediastinal lipomatosis is noted. No sizable hilar or mediastinal adenopathy is noted. The esophagus is within normal limits. Lungs/Pleura: Scattered atelectatic changes are noted throughout both lungs without focal confluent infiltrate. These changes have worsened in the interval from the prior exam. No sizable parenchymal nodule is seen. No sizable pleural effusion is noted. Musculoskeletal: Degenerative change of the thoracic spine is noted. Healing right anterior sixth rib fracture is noted. CT ABDOMEN PELVIS FINDINGS Hepatobiliary: Mild fatty infiltration of the liver is noted. Gallbladder is within normal limits. Pancreas: Unremarkable. No pancreatic ductal dilatation or surrounding inflammatory changes. Spleen: Normal in size without focal abnormality. Adrenals/Urinary Tract: Adrenal glands are within normal limits. Kidneys demonstrate a normal enhancement pattern bilaterally. A few scattered hypodensities are noted most consistent with renal cysts. No obstructive changes are seen.  No renal calculi are noted. The bladder is decompressed. Stomach/Bowel: Scattered diverticular changes noted within the colon. No evidence of diverticulitis is seen. No obstructive changes of the colon are noted. The appendix is within normal limits. The small bowel and stomach are unremarkable. Vascular/Lymphatic: Aortic atherosclerosis. No enlarged abdominal or pelvic lymph nodes. Reproductive: Prostate is unremarkable. Other: No abdominal wall hernia or abnormality. No abdominopelvic ascites. Musculoskeletal: Degenerative changes of lumbar spine and right hip joint are noted. No compression deformities are seen. IMPRESSION: CT of the chest: New scattered atelectatic changes bilaterally. This may contribute to the patient's underlying fever. No associated effusion is seen. No other acute abnormality is noted in the chest. CT of the abdomen and pelvis: No acute abnormality is noted. Diverticular change without evidence of diverticulitis is noted. Scattered renal cysts are seen. Aortic Atherosclerosis (ICD10-I70.0). Electronically Signed   By: Inez Catalina M.D.   On: 06/16/2020 15:12   CT CHEST ABDOMEN PELVIS W CONTRAST  Result Date: 06/08/2020 CLINICAL DATA:  Fall this morning with abdominal pain. EXAM: CT CHEST, ABDOMEN, AND PELVIS WITH CONTRAST TECHNIQUE: Multidetector CT imaging of the chest, abdomen and pelvis was performed following the standard protocol during bolus administration of intravenous contrast. CONTRAST:  168m OMNIPAQUE IOHEXOL 300 MG/ML  SOLN COMPARISON:  Same day chest radiograph, chest CT dated 04/06/2020, abdominal ultrasound dated 03/10/2019. FINDINGS: CT CHEST FINDINGS Cardiovascular: Vascular calcifications are seen in the coronary arteries and aortic arch. The heart is mildly enlarged. No pericardial effusion. Mediastinum/Nodes: No enlarged mediastinal, hilar, or axillary lymph nodes. Thyroid gland, trachea, and esophagus demonstrate no significant findings. Lungs/Pleura: There is a  mild left lower lobe atelectasis. The right lung is clear. There is no pleural effusion or pneumothorax. Musculoskeletal: There is a subacute to chronic fracture of the anterior right sixth rib. This appears new since 04/06/2020. Degenerative changes are seen in the spine, most significant at T8-9. CT ABDOMEN PELVIS FINDINGS Hepatobiliary: The liver is hypoattenuating, consistent with hepatic steatosis. No focal liver abnormality is seen. No gallstones, gallbladder wall thickening, or biliary dilatation. Pancreas: Unremarkable. No pancreatic ductal dilatation or surrounding inflammatory changes. Spleen: Normal in size without focal abnormality. Adrenals/Urinary Tract: Adrenal glands are unremarkable. Kidneys are normal, without renal calculi, focal lesion, or hydronephrosis. Bladder is unremarkable. Stomach/Bowel: Stomach is within normal limits. Appendix appears normal. There is colonic diverticulosis without evidence of diverticulitis. No evidence of bowel wall thickening, distention, or inflammatory changes. Vascular/Lymphatic: Aortic atherosclerosis. No enlarged abdominal or pelvic lymph nodes. Reproductive: Prostate is unremarkable. Other: No abdominal wall hernia or abnormality. No abdominopelvic ascites. Musculoskeletal: Degenerative changes are seen in the spine, most significant at L1-2 and  L3-4. Severe degenerative changes are seen in the right hip. IMPRESSION: 1. Subacute to chronic fracture of the anterior right sixth rib. 2. Hepatic steatosis. Aortic Atherosclerosis (ICD10-I70.0). Electronically Signed   By: Zerita Boers M.D.   On: 06/08/2020 11:55   DG Chest Port 1 View  Result Date: 06/15/2020 CLINICAL DATA:  Fever, history of COPD, asthma EXAM: PORTABLE CHEST 1 VIEW COMPARISON:  06/08/2020 FINDINGS: Single frontal view of the chest demonstrates stable enlargement of the cardiac silhouette. Chronic central vascular congestion without airspace disease, effusion, or pneumothorax. No acute bony  abnormalities. IMPRESSION: 1. Stable exam, no acute process. Electronically Signed   By: Randa Ngo M.D.   On: 06/15/2020 16:58   DG Chest Port 1 View  Result Date: 06/08/2020 CLINICAL DATA:  Questionable sepsis EXAM: PORTABLE CHEST 1 VIEW COMPARISON:  05/04/2020 FINDINGS: Cardiomegaly and vascular congestion, chronic. No Kerley lines. No air bronchogram. Hazy right apical pleural thickening which is stable. IMPRESSION: Cardiomegaly and vascular congestion. Electronically Signed   By: Monte Fantasia M.D.   On: 06/08/2020 09:54    Lab Data:  CBC: Recent Labs  Lab 06/15/20 0419 06/15/20 1758 06/16/20 0559 06/17/20 0507 06/18/20 0338  WBC 6.0 6.4 7.9 6.6 6.8  NEUTROABS  --  4.2  --   --   --   HGB 12.1* 11.3* 11.1* 11.1* 10.5*  HCT 39.1 36.2* 34.5* 33.2* 31.8*  MCV 100.8* 100.6* 99.1 96.2 96.4  PLT 170 198 181 199 734   Basic Metabolic Panel: Recent Labs  Lab 06/14/20 0704 06/15/20 0419 06/15/20 1758 06/16/20 0559 06/17/20 0507 06/18/20 0338  NA 135 137 133* 132* 133* 131*  K 3.8 3.5 3.9 3.5 3.1* 4.2  CL 76* 76* 75* 77* 78* 80*  CO2 49* 48* 44* 44* 42* 40*  GLUCOSE 134* 106* 142* 147* 101* 135*  BUN _0 33*  CREATININE 0.58* 0.60* 0.63 0.58* 0.50* 0.69  CALCIUM 9.0 9.2 9.1 8.9 8.6* 8.6*  MG 1.4* 1.9  --  1.6* 1.7 1.9  PHOS 3.2 2.8  --  2.6 3.6 5.1*   GFR: Estimated Creatinine Clearance: 156.4 mL/min (by C-G formula based on SCr of 0.69 mg/dL). Liver Function Tests: Recent Labs  Lab 06/15/20 1758  AST 28  ALT 27  ALKPHOS 70  BILITOT 1.0  PROT 6.2*  ALBUMIN 2.7*   No results for input(s): LIPASE, AMYLASE in the last 168 hours. No results for input(s): AMMONIA in the last 168 hours. Coagulation Profile: Recent Labs  Lab 06/15/20 1758  INR 1.4*   Cardiac Enzymes: No results for input(s): CKTOTAL, CKMB, CKMBINDEX, TROPONINI in the last 168 hours. BNP (last 3 results) No results for input(s): PROBNP in the last 8760 hours. HbA1C: No results  for input(s): HGBA1C in the last 72 hours. CBG: Recent Labs  Lab 06/12/20 1147  GLUCAP 127*   Lipid Profile: No results for input(s): CHOL, HDL, LDLCALC, TRIG, CHOLHDL, LDLDIRECT in the last 72 hours. Thyroid Function Tests: No results for input(s): TSH, T4TOTAL, FREET4, T3FREE, THYROIDAB in the last 72 hours. Anemia Panel: No results for input(s): VITAMINB12, FOLATE, FERRITIN, TIBC, IRON, RETICCTPCT in the last 72 hours. Urine analysis:    Component Value Date/Time   COLORURINE AMBER (A) 06/15/2020 1620   APPEARANCEUR HAZY (A) 06/15/2020 1620   APPEARANCEUR Hazy (A) 12/25/2018 1442   LABSPEC 1.021 06/15/2020 1620   PHURINE 8.0 06/15/2020 1620   GLUCOSEU NEGATIVE 06/15/2020 1620   HGBUR NEGATIVE 06/15/2020 1620   [place] 06/15/2020  [place] Negative 12/25/2018 [place] 06/15/2020 [place] 06/15/2020 1620   NITRITE NEGATIVE 06/15/2020 [place] 06/15/2020 1620     Iman Orourke M.D. [facility] 06/18/2020, 1:27 PM  Available via Epic secure chat 7am-7pm After 7 pm, please refer to night coverage provider listed on amion.

## 2020-06-18 NOTE — Plan of Care (Addendum)
Patient feeling better today and now looking for wallet.  Sates he needs to pay bills.  Security does not have, not in belongings in room and not documented on admission.  Began stay with several days in ICU - he was not oriented on admission.  Anyhow, Chrg and AC aware - Senr Chat to  director to make aware.  Patient found wallet - confirmed with roommate that it is safe at home. 1053

## 2020-06-19 DIAGNOSIS — I4891 Unspecified atrial fibrillation: Secondary | ICD-10-CM | POA: Diagnosis not present

## 2020-06-19 DIAGNOSIS — F1010 Alcohol abuse, uncomplicated: Secondary | ICD-10-CM | POA: Diagnosis not present

## 2020-06-19 DIAGNOSIS — F10230 Alcohol dependence with withdrawal, uncomplicated: Secondary | ICD-10-CM | POA: Diagnosis not present

## 2020-06-19 LAB — CBC
HCT: 31.4 % — ABNORMAL LOW (ref 39.0–52.0)
Hemoglobin: 10.1 g/dL — ABNORMAL LOW (ref 13.0–17.0)
MCH: 31.5 pg (ref 26.0–34.0)
MCHC: 32.2 g/dL (ref 30.0–36.0)
MCV: 97.8 fL (ref 80.0–100.0)
Platelets: 243 10*3/uL (ref 150–400)
RBC: 3.21 MIL/uL — ABNORMAL LOW (ref 4.22–5.81)
RDW: 16.2 % — ABNORMAL HIGH (ref 11.5–15.5)
WBC: 4 10*3/uL (ref 4.0–10.5)
nRBC: 0 % (ref 0.0–0.2)

## 2020-06-19 LAB — BASIC METABOLIC PANEL
Anion gap: 11 (ref 5–15)
BUN: 36 mg/dL — ABNORMAL HIGH (ref 6–20)
CO2: 39 mmol/L — ABNORMAL HIGH (ref 22–32)
Calcium: 8.7 mg/dL — ABNORMAL LOW (ref 8.9–10.3)
Chloride: 84 mmol/L — ABNORMAL LOW (ref 98–111)
Creatinine, Ser: 0.65 mg/dL (ref 0.61–1.24)
GFR, Estimated: >60 mL/min (ref >60)
Glucose, Bld: 139 mg/dL — ABNORMAL HIGH (ref 70–99)
Potassium: 4.3 mmol/L (ref 3.5–5.1)
Sodium: 134 mmol/L — ABNORMAL LOW (ref 135–145)

## 2020-06-19 LAB — C-REACTIVE PROTEIN: CRP: 10.2 mg/dL — ABNORMAL HIGH (ref <1.0)

## 2020-06-19 LAB — MAGNESIUM: Magnesium: 1.9 mg/dL (ref 1.7–2.4)

## 2020-06-19 LAB — PHOSPHORUS: Phosphorus: 4.1 mg/dL (ref 2.5–4.6)

## 2020-06-19 NOTE — Progress Notes (Signed)
[facility]                                                                              Patient Demographics  [name] [name], is a 60 y.o. male, DOB - 02/01/1961, ALP:[id number]  Admit date - 06/08/2020   Admitting Physician Amy August Luz, DO  Outpatient Primary MD for the patient is [name], DO  Outpatient specialists:   LOS - 11  days   Medical records reviewed and are as summarized below:    Chief Complaint  Patient presents with  . 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 fib, OSA, history of DVT, GERD, hypothyroid, hyperlipidemia, history of PE, chronic bilateral lower extremity venous stasis, presented with a fall.  Denied any loss of consciousness or hitting his head.  Reported abdominal pain.He told EMS that he did not want to go to the hospital and that he just wants to be left alone to die. On admission, reported nausea, baseline shortness of breath, alcohol use 4 to 5 hours prior to presentation.  Denied any SI and HI.[name] Presentation consistent with acute on chronic respiratory failure likely secondary to COPD/CHF.  Remains stepdown status.  Was on BiPAP since admission, taken off on 4/25, now tolerating O2 via [place].  Patient has been seen by palliative medicine.  DNR/DNI, would like to treat the treatable.    Assessment & Plan   Acute on chronic respiratory failure with hypoxia with underlying COPD, OSA, OHS - Intermittently requiring BiPAP, O2 sats 100% on 5 L -Continue O2, BiPAP as needed.  Outpatient on 2 to 3 L chronically. -2D echo 04/07/2020 had shown EF of 30 to 35%, diastolic parameters normal -- Overall appears to be progressively improving  Acute on chronic systolic CHF exacerbation -2D echo  04/07/2020 had shown EF of 30 to 32%, diastolic parameters normal -Continue Lasix 60 mg p.o. daily, beta-blocker -Balance of 4.7 L, continue strict I's and O's and daily weights renal function stable  Acute gout, possible cellulitis, atelectasis -On 5/1 and 5/2, patient had mild fever with bilateral feet and wrist pain, states consistent with his gout flares,  -states now pain is improving  -Blood cultures negative to date, some erythema and tenderness of left lower extremity, has venous stasis dermatitis -ESR 70, uric acid 6.5, CRP 10.2.  Continue colchicine, prednisone (started on 5/2). Right Shoulder x-ray on 3/6 was negative for any dislocation or fracture  Aerococcus bacteriuria -Typically a contaminant but pt endorses dysuria. Dr Si Raider discussed w/ pharm, nitrofurantoin typically covers - Received 5 days of Macrobid  Atrial fibrillation with RVR -Likely due to acute alcohol withdrawal, has been weaned off of Cardizem drip -Continue metoprolol, Eliquis.  Heart rate controlled.  Alcohol abuse -Drinks half a gallon of whiskey daily, currently no alcohol withdrawals Continue thiamine, Ativan as needed  Right sixth rib fracture Likely due to fall, acute to subacute Continue pain control as needed  Essential hypertension  Stable, continue beta-blocker  Hypokalemia -Resolved  Hyponatremia -Continue monitoring, sodium 131  Macrocytosis -Likely due to alcohol use, B12 folate normal  Depression Continue duloxetine  Hypothyroidism TSH 11.4, continue levothyroxine Compliance unclear, will need repeat TSH in 4 weeks.  OSA, OHS, morbid obesity Continue BiPAP qhs  Failure to thrive -APS involved and pursuing guardianship.  APS wants patient to remain here for the time being.  Unable to safely care for himself at home.  Will need skilled nursing care, high risk for readmissions.  Pressure injury -Right thigh, left posterior thigh stage II, present on admission -stage 2  sacrum, POA     Morbid obesity  Estimated body mass index is 58.83 kg/m as calculated from the following:   Height as of this encounter: 5' 8" (1.727 m).   Weight as of this encounter: 175.5 kg.  Code Status: dnr DVT Prophylaxis:   apixaban (ELIQUIS) tablet 5 mg   Level of Care: Level of care: Progressive Cardiac Family Communication: Discussed all imaging results, lab results, explained to the patient   Disposition Plan:     Status is: Inpatient  Remains inpatient appropriate because:Inpatient level of care appropriate due to severity of illness   Dispo: The patient is from: Home              Anticipated d/c is to: SNF              Patient currently is not medically stable to d/c.   Difficult to place patient Yes      Time Spent in minutes   25 minutes  Procedures:  None   Consultants:   Palliative medicine  Antimicrobials:   Anti-infectives (From admission, onward)   Start     Dose/Rate Route Frequency Ordered Stop   06/16/20 1000  nitrofurantoin (macrocrystal-monohydrate) (MACROBID) capsule 100 mg        100 mg Oral Every 12 hours 06/16/20 0843 06/16/20 2209   06/16/20 1000  cefTRIAXone (ROCEPHIN) 2 g in sodium chloride 0.9 % 100 mL IVPB        2 g 200 mL/hr over 30 Minutes Intravenous Every 24 hours 06/16/20 0843     06/11/20 1200  nitrofurantoin (macrocrystal-monohydrate) (MACROBID) capsule 100 mg  Status:  Discontinued        100 mg Oral Every 12 hours 06/11/20 1026 06/16/20 0843   06/09/20 0300  vancomycin (VANCOREADY) IVPB 1750 mg/350 mL  Status:  Discontinued        1,750 mg 175 mL/hr over 120 Minutes Intravenous Every 12 hours 06/08/20 1610 06/09/20 0800   06/08/20 2200  metroNIDAZOLE (FLAGYL) IVPB 500 mg  Status:  Discontinued       Note to Pharmacy: He got one dose in the ED already. Please schedule appropriately.   500 mg 100 mL/hr over 60 Minutes Intravenous Every 8 hours 06/08/20 1436 06/09/20 0800   06/08/20 2000  ceFEPIme (MAXIPIME) 2 g in  sodium chloride 0.9 % 100 mL IVPB  Status:  Discontinued        2 g 200 mL/hr over 30 Minutes Intravenous Every 8 hours 06/08/20 1447 06/09/20 0800   06/08/20 1445  metroNIDAZOLE (FLAGYL) IVPB 500 mg  Status:  Discontinued       Note to Pharmacy: He got one dose in the ED already. Please schedule appropriately.   500 mg 100 mL/hr over 60 Minutes Intravenous Every 8 hours 06/08/20 1434 06/08/20 1436   06/08/20 1445  vancomycin (VANCOCIN) IVPB 1000 mg/200 mL premix       "  Followed by" Linked Group Details   1,000 mg 200 mL/hr over 60 Minutes Intravenous  Once 06/08/20 1441 06/08/20 1633   06/08/20 1445  vancomycin (VANCOREADY) IVPB 1500 mg/300 mL  Status:  Discontinued       "Followed by" Linked Group Details   1,500 mg 150 mL/hr over 120 Minutes Intravenous  Once 06/08/20 1441 06/09/20 0717   06/08/20 1100  ceFEPIme (MAXIPIME) 2 g in sodium chloride 0.9 % 100 mL IVPB        2 g 200 mL/hr over 30 Minutes Intravenous  Once 06/08/20 1047 06/08/20 1331   06/08/20 1100  metroNIDAZOLE (FLAGYL) IVPB 500 mg        500 mg 100 mL/hr over 60 Minutes Intravenous  Once 06/08/20 1047 06/08/20 1529   06/08/20 1100  vancomycin (VANCOCIN) IVPB 1000 mg/200 mL premix  Status:  Discontinued        1,000 mg 200 mL/hr over 60 Minutes Intravenous  Once 06/08/20 1047 06/08/20 1441         Medications  Scheduled Meds: . apixaban  5 mg Oral BID  . vitamin C  500 mg Oral BID  . aspirin  81 mg Oral Daily  . atorvastatin  40 mg Oral Daily  . Chlorhexidine Gluconate Cloth  6 each Topical Daily  . colchicine  0.6 mg Oral Daily  . DULoxetine  20 mg Oral Daily  . furosemide  60 mg Oral Daily  . gabapentin  300 mg Oral TID  . levothyroxine  75 mcg Oral QAC breakfast  . lisinopril  2.5 mg Oral Daily  . methocarbamol  750 mg Oral TID  . metoprolol tartrate  100 mg Oral BID  . montelukast  10 mg Oral QHS  . multivitamin with minerals  1 tablet Oral Daily  . polyethylene glycol  17 g Oral Daily  .  predniSONE  40 mg Oral Q breakfast  . Ensure Max Protein  11 oz Oral TID WC  . thiamine  100 mg Oral Daily   Or  . thiamine  100 mg Intravenous Daily   Continuous Infusions: . cefTRIAXone (ROCEPHIN)  IV 2 g (06/19/20 0835)   PRN Meds:.acetaminophen **OR** acetaminophen, albuterol, alum & mag hydroxide-simeth, HYDROmorphone (DILAUDID) injection, LORazepam, meclizine, metoprolol tartrate, midodrine, nitroGLYCERIN, ondansetron **OR** ondansetron (ZOFRAN) IV, oxyCODONE, traZODone      Subjective:   Kou Gucciardo was seen and examined today.  Alert and oriented, states gout in the wrist and feet is improving.  Otherwise has chronic pain in the knees and right shoulder.  No fevers.  No abdominal pain nausea or vomiting, diarrhea.  No chest pain.. No acute events overnight.    Objective:   Vitals:   06/18/20 2338 06/19/20 0515 06/19/20 0533 06/19/20 0734  BP:  (!) 100/57  122/80  Pulse: 75 70 69 77  Resp:  20  18  Temp:  97.9 F (36.6 C)  98.7 F (37.1 C)  TempSrc:    Oral  SpO2: 98% 100%  100%  Weight:  (!) 175.5 kg  (!) 175.5 kg  Height:        Intake/Output Summary (Last 24 hours) at 06/19/2020 1419 Last data filed at 06/19/2020 0900 Gross per 24 hour  Intake 480 ml  Output 800 ml  Net -320 ml     Wt Readings from Last 3 Encounters:  06/19/20 (!) 175.5 kg  05/16/20 (!) 188.1 kg  04/30/20 (!) 186 kg   Physical Exam  General: Alert and oriented x 3, NAD  Cardiovascular: S1 S2 clear, RRR.  Respiratory: CTAB, no wheezing, rales or rhonchi  Gastrointestinal: Soft, nontender, nondistended, NBS  Ext: bilateral lower extremities wrapped  Neuro: no new deficits  Musculoskeletal: No cyanosis, clubbing  Skin: Stage I sacral decub, stage II bilateral thighs  Psych: Normal affect and demeanor, alert and oriented x3   Data Reviewed:  I have personally reviewed following labs and imaging studies  Micro Results Recent Results (from the past 240 hour(s))  CULTURE,  BLOOD (ROUTINE X 2) w Reflex to ID Panel     Status: None (Preliminary result)   Collection Time: 06/15/20  5:59 PM   Specimen: BLOOD  Result Value Ref Range Status   Specimen Description BLOOD BLOOD LEFT HAND  Final   Special Requests   Final    BOTTLES DRAWN AEROBIC AND ANAEROBIC Blood Culture results may not be optimal due to an inadequate volume of blood received in culture bottles   Culture   Final    NO GROWTH 4 DAYS Performed at [facility], [address] [place], [place] [address]    Report Status PENDING  Incomplete  CULTURE, BLOOD (ROUTINE X 2) w Reflex to ID Panel     Status: None (Preliminary result)   Collection Time: 06/15/20  6:01 PM   Specimen: BLOOD  Result Value Ref Range Status   Specimen Description BLOOD BLOOD RIGHT HAND  Final   Special Requests   Final    BOTTLES DRAWN AEROBIC ONLY Blood Culture results may not be optimal due to an inadequate volume of blood received in culture bottles   Culture   Final    NO GROWTH 4 DAYS Performed at [facility], [address] [place],  [address]    Report Status PENDING  Incomplete    Radiology Reports CT CHEST ABDOMEN PELVIS W CONTRAST  Result Date: 06/16/2020 CLINICAL DATA:  Fever of unknown origin EXAM: CT CHEST, ABDOMEN, AND PELVIS WITH CONTRAST TECHNIQUE: Multidetector CT imaging of the chest, abdomen and pelvis was performed following the standard protocol during bolus administration of intravenous contrast. CONTRAST:  122m OMNIPAQUE IOHEXOL 300 MG/ML  SOLN COMPARISON:  06/08/2020 FINDINGS: CT CHEST FINDINGS Cardiovascular: Thoracic aorta and its branches demonstrate mild atherosclerotic calcification. No aneurysmal dilatation or dissection is seen. Heart is mildly enlarged but stable. Coronary calcifications are noted. Although not timed for embolus evaluation common no large central pulmonary embolus is seen. Mediastinum/Nodes: Thoracic inlet is within normal limits. Mild mediastinal  lipomatosis is noted. No sizable hilar or mediastinal adenopathy is noted. The esophagus is within normal limits. Lungs/Pleura: Scattered atelectatic changes are noted throughout both lungs without focal confluent infiltrate. These changes have worsened in the interval from the prior exam. No sizable parenchymal nodule is seen. No sizable pleural effusion is noted. Musculoskeletal: Degenerative change of the thoracic spine is noted. Healing right anterior sixth rib fracture is noted. CT ABDOMEN PELVIS FINDINGS Hepatobiliary: Mild fatty infiltration of the liver is noted. Gallbladder is within normal limits. Pancreas: Unremarkable. No pancreatic ductal dilatation or surrounding inflammatory changes. Spleen: Normal in size without focal abnormality. Adrenals/Urinary Tract: Adrenal glands are within normal limits. Kidneys demonstrate a normal enhancement pattern bilaterally. A few scattered hypodensities are noted most consistent with renal cysts. No obstructive changes are seen. No renal calculi are noted. The bladder is decompressed. Stomach/Bowel: Scattered diverticular changes noted within the colon. No evidence of diverticulitis is seen. No obstructive changes of the colon are noted. The appendix is within normal limits. The small bowel  and stomach are unremarkable. Vascular/Lymphatic: Aortic atherosclerosis. No enlarged abdominal or pelvic lymph nodes. Reproductive: Prostate is unremarkable. Other: No abdominal wall hernia or abnormality. No abdominopelvic ascites. Musculoskeletal: Degenerative changes of lumbar spine and right hip joint are noted. No compression deformities are seen. IMPRESSION: CT of the chest: New scattered atelectatic changes bilaterally. This may contribute to the patient's underlying fever. No associated effusion is seen. No other acute abnormality is noted in the chest. CT of the abdomen and pelvis: No acute abnormality is noted. Diverticular change without evidence of diverticulitis is  noted. Scattered renal cysts are seen. Aortic Atherosclerosis (ICD10-I70.0). Electronically Signed   By: Inez Catalina M.D.   On: 06/16/2020 15:12   CT CHEST ABDOMEN PELVIS W CONTRAST  Result Date: 06/08/2020 CLINICAL DATA:  Fall this morning with abdominal pain. EXAM: CT CHEST, ABDOMEN, AND PELVIS WITH CONTRAST TECHNIQUE: Multidetector CT imaging of the chest, abdomen and pelvis was performed following the standard protocol during bolus administration of intravenous contrast. CONTRAST:  176m OMNIPAQUE IOHEXOL 300 MG/ML  SOLN COMPARISON:  Same day chest radiograph, chest CT dated 04/06/2020, abdominal ultrasound dated 03/10/2019. FINDINGS: CT CHEST FINDINGS Cardiovascular: Vascular calcifications are seen in the coronary arteries and aortic arch. The heart is mildly enlarged. No pericardial effusion. Mediastinum/Nodes: No enlarged mediastinal, hilar, or axillary lymph nodes. Thyroid gland, trachea, and esophagus demonstrate no significant findings. Lungs/Pleura: There is a mild left lower lobe atelectasis. The right lung is clear. There is no pleural effusion or pneumothorax. Musculoskeletal: There is a subacute to chronic fracture of the anterior right sixth rib. This appears new since 04/06/2020. Degenerative changes are seen in the spine, most significant at T8-9. CT ABDOMEN PELVIS FINDINGS Hepatobiliary: The liver is hypoattenuating, consistent with hepatic steatosis. No focal liver abnormality is seen. No gallstones, gallbladder wall thickening, or biliary dilatation. Pancreas: Unremarkable. No pancreatic ductal dilatation or surrounding inflammatory changes. Spleen: Normal in size without focal abnormality. Adrenals/Urinary Tract: Adrenal glands are unremarkable. Kidneys are normal, without renal calculi, focal lesion, or hydronephrosis. Bladder is unremarkable. Stomach/Bowel: Stomach is within normal limits. Appendix appears normal. There is colonic diverticulosis without evidence of diverticulitis. No  evidence of bowel wall thickening, distention, or inflammatory changes. Vascular/Lymphatic: Aortic atherosclerosis. No enlarged abdominal or pelvic lymph nodes. Reproductive: Prostate is unremarkable. Other: No abdominal wall hernia or abnormality. No abdominopelvic ascites. Musculoskeletal: Degenerative changes are seen in the spine, most significant at L1-2 and L3-4. Severe degenerative changes are seen in the right hip. IMPRESSION: 1. Subacute to chronic fracture of the anterior right sixth rib. 2. Hepatic steatosis. Aortic Atherosclerosis (ICD10-I70.0). Electronically Signed   By: TZerita BoersM.D.   On: 06/08/2020 11:55   DG Chest Port 1 View  Result Date: 06/15/2020 CLINICAL DATA:  Fever, history of COPD, asthma EXAM: PORTABLE CHEST 1 VIEW COMPARISON:  06/08/2020 FINDINGS: Single frontal view of the chest demonstrates stable enlargement of the cardiac silhouette. Chronic central vascular congestion without airspace disease, effusion, or pneumothorax. No acute bony abnormalities. IMPRESSION: 1. Stable exam, no acute process. Electronically Signed   By: MRanda NgoM.D.   On: 06/15/2020 16:58   DG Chest Port 1 View  Result Date: 06/08/2020 CLINICAL DATA:  Questionable sepsis EXAM: PORTABLE CHEST 1 VIEW COMPARISON:  05/04/2020 FINDINGS: Cardiomegaly and vascular congestion, chronic. No Kerley lines. No air bronchogram. Hazy right apical pleural thickening which is stable. IMPRESSION: Cardiomegaly and vascular congestion. Electronically Signed   By: JMonte FantasiaM.D.   On: 06/08/2020 09:54    Lab Data:  CBC: Recent Labs  Lab 06/15/20 1758 06/16/20 0559 06/17/20 0507 06/18/20 0338 06/19/20 0349  WBC 6.4 7.9 6.6 6.8 4.0  NEUTROABS 4.2  --   --   --   --   HGB 11.3* 11.1* 11.1* 10.5* 10.1*  HCT 36.2* 34.5* 33.2* 31.8* 31.4*  MCV 100.6* 99.1 96.2 96.4 97.8  PLT 198 181 199 258 614   Basic Metabolic Panel: Recent Labs  Lab 06/15/20 0419 06/15/20 1758 06/16/20 0559 06/17/20 0507  06/18/20 0338 06/19/20 0349  NA 137 133* 132* 133* 131* 134*  K 3.5 3.9 3.5 3.1* 4.2 4.3  CL 76* 75* 77* 78* 80* 84*  CO2 48* 44* 44* 42* 40* 39*  GLUCOSE 106* 142* 147* 101* 135* 139*  BUN _0 33* 36*  CREATININE 0.60* 0.63 0.58* 0.50* 0.69 0.65  CALCIUM 9.2 9.1 8.9 8.6* 8.6* 8.7*  MG 1.9  --  1.6* 1.7 1.9 1.9  PHOS 2.8  --  2.6 3.6 5.1* 4.1   GFR: Estimated Creatinine Clearance: 156.4 mL/min (by C-G formula based on SCr of 0.65 mg/dL). Liver Function Tests: Recent Labs  Lab 06/15/20 1758  AST 28  ALT 27  ALKPHOS 70  BILITOT 1.0  PROT 6.2*  ALBUMIN 2.7*   No results for input(s): LIPASE, AMYLASE in the last 168 hours. No results for input(s): AMMONIA in the last 168 hours. Coagulation Profile: Recent Labs  Lab 06/15/20 1758  INR 1.4*   Cardiac Enzymes: No results for input(s): CKTOTAL, CKMB, CKMBINDEX, TROPONINI in the last 168 hours. BNP (last 3 results) No results for input(s): PROBNP in the last 8760 hours. HbA1C: No results for input(s): HGBA1C in the last 72 hours. CBG: No results for input(s): GLUCAP in the last 168 hours. Lipid Profile: No results for input(s): CHOL, HDL, LDLCALC, TRIG, CHOLHDL, LDLDIRECT in the last 72 hours. Thyroid Function Tests: No results for input(s): TSH, T4TOTAL, FREET4, T3FREE, THYROIDAB in the last 72 hours. Anemia Panel: No results for input(s): VITAMINB12, FOLATE, FERRITIN, TIBC, IRON, RETICCTPCT in the last 72 hours. Urine analysis:    Component Value Date/Time   COLORURINE AMBER (A) 06/15/2020 1620   APPEARANCEUR HAZY (A) 06/15/2020 1620   APPEARANCEUR Hazy (A) 12/25/2018 1442   LABSPEC 1.021 06/15/2020 1620   PHURINE 8.0 06/15/2020 1620   GLUCOSEU NEGATIVE 06/15/2020 1620   HGBUR NEGATIVE 06/15/2020 1620   BILIRUBINUR NEGATIVE 06/15/2020 1620   BILIRUBINUR Negative 12/25/2018 1442   KETONESUR NEGATIVE 06/15/2020 1620   PROTEINUR NEGATIVE 06/15/2020 1620   NITRITE NEGATIVE 06/15/2020 1620   LEUKOCYTESUR  NEGATIVE 06/15/2020 1620     Naiara Lombardozzi M.D. [facility] 06/19/2020, 2:19 PM  Available via Epic secure chat 7am-7pm After 7 pm, please refer to night coverage provider listed on amion.

## 2020-06-19 NOTE — Progress Notes (Signed)
Physical Therapy Treatment Patient Details Name: [name] [name] MRN: [id number] DOB: 18-Jun-1960 Today's Date: 06/19/2020    History of Present Illness Pt is a 60 y.o. 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  MD assessment includes: SIRS, acute on chronic respiratory failure, A-fib with RVR, alcohol withdrawal, R 6th rib Fx, and FTT.    PT Comments    Pt was long sitting in bed upon arriving. She agrees to PT session and is cooperative and pleasant throughout. He states he had some chest pain earlier in day while sitting up to eat breakfast. Did not have c/o chest pain at beginning of session. He was able to tolerate sitting up EOB x ~ 8 minutes and stood 1 x ~ 10 sec prior to c/o gout pain. Therapist questions if pain limiting for fatigue. Pt unwilling to try to stand a 2nd trial. C/o slight chest pain after standing. HR/BP/and O2 all stable. Repositioned to long sitting. Reviewed bed exercises. Acute PT will continue to follow and progress per POC.    Follow Up Recommendations  SNF;Supervision/Assistance - 24 hour     Equipment Recommendations  Rolling walker with 5" wheels       Precautions / Restrictions Precautions Precautions: Fall Restrictions Weight Bearing Restrictions: No    Mobility  Bed Mobility Overal bed mobility: Needs Assistance Bed Mobility: Supine to Sit;Sit to Supine     Supine to sit: Min assist Sit to supine: Min  assist   General bed mobility comments: Does require increase time,min assist required. HOB elevated and heavy use of bed rails    Transfers Overall transfer level: Needs assistance Equipment used: Rolling walker (2 wheeled) Transfers: Sit to/from Stand Sit to Stand: From elevated surface;Min guard         General transfer comment: CGA for safety. Does have to use momentum and rocking to achieve stnding. Only able to stand ~ 10 sec before c/o severe gout pain. Therapist questions if pt really just needs to sit due to being so decontioned/SOB on exertion.  Ambulation/Gait             General Gait Details: pt unwilling/ unsafe   Stairs             Wheelchair Mobility    Modified Rankin (Stroke Patients Only)       Balance Overall balance assessment: Needs assistance Sitting-balance support: No upper extremity supported Sitting balance-Leahy Scale: Good     Standing balance support: Bilateral upper extremity supported Standing balance-Leahy Scale: Fair Standing balance comment: poor tolerance 2/2 foot pain (R>L) but able to maintain balance with UEs/walker with minimally dynamic EOB tasks                            Cognition Arousal/Alertness: Awake/alert Behavior During Therapy: WFL for tasks assessed/performed Overall Cognitive Status: Within Functional Limits for tasks assessed                                 General Comments: Pt  A and O x 4      Exercises      General Comments General comments (skin integrity, edema, etc.): Reviewed importance of performing exercioses in bed throughout the day      Pertinent Vitals/Pain Pain Assessment: 0-10 Pain Score: 10-Worst pain ever (c/o gout pain in standing) Faces Pain Scale: Hurts a little bit Pain Location: R foot > L Pain Descriptors / Indicators: Crushing;Discomfort Pain Intervention(s): Limited activity within patient's tolerance;Monitored during session;Premedicated  before session;Repositioned    Home Living                      Prior Function            PT Goals (current goals can now be found in the care plan section) Acute Rehab PT Goals Patient Stated Goal: none stated Progress towards PT goals: Progressing toward goals    Frequency    Min 2X/week      PT Plan Current plan remains appropriate    Co-evaluation              AM-PAC PT "6 Clicks" Mobility   Outcome Measure  Help needed turning from your back to your side while in a flat bed without using bedrails?: A Little Help needed moving from lying on your back to sitting on the side of a flat bed without using bedrails?: A Little Help needed moving to and from a bed to a chair (including a wheelchair)?: A Lot Help needed standing up from a chair using your arms (e.g., wheelchair or bedside chair)?: A Lot Help needed to walk in hospital room?: Total Help needed climbing 3-5 steps with a railing? : Total 6 Click Score: 12    End of Session Equipment Utilized During Treatment: Oxygen Activity Tolerance: Patient tolerated treatment well Patient left: in bed;with call bell/phone within reach;with nursing/sitter in room Nurse Communication: Mobility status PT Visit Diagnosis: Muscle weakness (generalized) (M62.81);Difficulty in walking, not elsewhere classified (R26.2)     Time: 6644-0347 PT Time Calculation (min) (ACUTE ONLY): 17 min  Charges:  $Therapeutic Activity: 8-22 mins                    Jetta Lout PTA 06/19/20, 1:00 PM

## 2020-06-20 DIAGNOSIS — I4891 Unspecified atrial fibrillation: Secondary | ICD-10-CM | POA: Diagnosis not present

## 2020-06-20 DIAGNOSIS — F1010 Alcohol abuse, uncomplicated: Secondary | ICD-10-CM | POA: Diagnosis not present

## 2020-06-20 DIAGNOSIS — F10230 Alcohol dependence with withdrawal, uncomplicated: Secondary | ICD-10-CM | POA: Diagnosis not present

## 2020-06-20 LAB — CULTURE, BLOOD (ROUTINE X 2)
Culture: NO GROWTH
Culture: NO GROWTH

## 2020-06-20 LAB — BASIC METABOLIC PANEL
Anion gap: 8 (ref 5–15)
BUN: 26 mg/dL — ABNORMAL HIGH (ref 6–20)
CO2: 43 mmol/L — ABNORMAL HIGH (ref 22–32)
Calcium: 8.9 mg/dL (ref 8.9–10.3)
Chloride: 85 mmol/L — ABNORMAL LOW (ref 98–111)
Creatinine, Ser: 0.67 mg/dL (ref 0.61–1.24)
GFR, Estimated: >60 mL/min (ref >60)
Glucose, Bld: 169 mg/dL — ABNORMAL HIGH (ref 70–99)
Potassium: 4 mmol/L (ref 3.5–5.1)
Sodium: 136 mmol/L (ref 135–145)

## 2020-06-20 LAB — CBC
HCT: 34.9 % — ABNORMAL LOW (ref 39.0–52.0)
Hemoglobin: 11.1 g/dL — ABNORMAL LOW (ref 13.0–17.0)
MCH: 31.6 pg (ref 26.0–34.0)
MCHC: 31.8 g/dL (ref 30.0–36.0)
MCV: 99.4 fL (ref 80.0–100.0)
Platelets: 319 10*3/uL (ref 150–400)
RBC: 3.51 MIL/uL — ABNORMAL LOW (ref 4.22–5.81)
RDW: 16.6 % — ABNORMAL HIGH (ref 11.5–15.5)
WBC: 4.6 10*3/uL (ref 4.0–10.5)
nRBC: 0 % (ref 0.0–0.2)

## 2020-06-20 LAB — PHOSPHORUS: Phosphorus: 3 mg/dL (ref 2.5–4.6)

## 2020-06-20 LAB — MAGNESIUM: Magnesium: 1.8 mg/dL (ref 1.7–2.4)

## 2020-06-20 MED ORDER — ALLOPURINOL 100 MG PO TABS
100.0000 mg | ORAL_TABLET | Freq: Every day | ORAL | Status: DC
  Administered 2020-06-21 – 2020-06-29 (×9): 100 mg via ORAL
  Filled 2020-06-20 (×9): qty 1

## 2020-06-20 NOTE — Progress Notes (Signed)
OT Cancellation Note  Patient Details Name: [name] [name] MRN: [id number] DOB: 1960/05/07   Cancelled Treatment:    Reason Eval/Treat Not Completed: Patient at procedure or test/ unavailable  Pt preparing to leave the room for MRI of R shoulder. Will f/u for OT treatment at later date/time as able/pt available. Thank you.  Rejeana Brock, MS, OTR/L ascom [phone] 06/20/20, 6:13 PM

## 2020-06-20 NOTE — Progress Notes (Signed)
[facility]                                                                              Patient Demographics  [name] [name], is a 60 y.o. male, DOB - [birth year], JAR:[id number]  Admit date - 06/08/2020   Admitting Physician Amy August Luz, DO  Outpatient Primary MD for the patient is [name], DO  Outpatient specialists:   LOS - 12  days   Medical records reviewed and are as summarized below:    Chief Complaint  Patient presents with  . 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 fib, OSA, history of DVT, GERD, hypothyroid, hyperlipidemia, history of PE, chronic bilateral lower extremity venous stasis, presented with a fall.  Denied any loss of consciousness or hitting his head.  Reported abdominal pain.He told EMS that he did not want to go to the hospital and that he just wants to be left alone to die. On admission, reported nausea, baseline shortness of breath, alcohol use 4 to 5 hours prior to presentation.  Denied any SI and HI.[name] Presentation consistent with acute on chronic respiratory failure likely secondary to COPD/CHF.  Remains stepdown status.  Was on BiPAP since admission, taken off on 4/25, now tolerating O2 via [place].  Patient has been seen by palliative medicine.  DNR/DNI, would like to treat the treatable.    Assessment & Plan   Acute on chronic respiratory failure with hypoxia with underlying COPD, OSA, OHS - Intermittently requiring BiPAP -Continue O2, BiPAP as needed.  Outpatient on 2 to 3 L chronically. -2D echo 04/07/2020 had shown EF of 30 to 11%, diastolic parameters normal -- O2 sats 100% on 5 L, progressively improved  Acute on chronic systolic CHF exacerbation -2D echo 04/07/2020 had shown EF  of 30 to 64%, diastolic parameters normal -Continue Lasix 60 mg daily, beta-blocker -Negative balance of 6.8 L, renal function stable  Acute gout, possible cellulitis, atelectasis -On 5/1 and 5/2, patient had mild fever with bilateral feet and wrist pain, states consistent with his gout flares,  -Blood cultures negative to date, some erythema and tenderness of left lower extremity, has venous stasis dermatitis.  Discontinue Rocephin -ESR 70, uric acid 6.5, CRP 10.2.  Continue colchicine, prednisone (started on 5/2).,  Resume allopurinol  Right shoulder pain, chronic - Right Shoulder x-ray on 3/6 was negative for any dislocation or fracture -RI of the right shoulder to rule out rotator cuff injury  Aerococcus bacteriuria -Typically a contaminant but pt endorses dysuria. Dr Si Raider discussed w/ pharm, nitrofurantoin typically covers - Received 5 days of Macrobid  Atrial fibrillation with RVR -Likely due to acute alcohol withdrawal, has been weaned off of Cardizem drip -Heart rate controlled, continue metoprolol, eliquis  Alcohol abuse -Drinks half a gallon of whiskey daily, currently no alcohol withdrawals Continue thiamine, Ativan as needed  Right sixth rib fracture Likely due to fall, acute to subacute Continue pain control as  needed  Essential hypertension Stable, continue beta-blocker  Hypokalemia -Resolved  Hyponatremia -Resolved  Macrocytosis -Likely due to alcohol use, B12 folate normal  Depression Continue duloxetine  Hypothyroidism TSH 11.4, continue levothyroxine Compliance unclear, will need repeat TSH in 4 weeks.  OSA, OHS, morbid obesity Continue BiPAP qhs  Failure to thrive -APS involved and pursuing guardianship.  APS wants patient to remain here for the time being.  Unable to safely care for himself at home.  Will need skilled nursing care, high risk for readmissions.  Pressure injury -Right thigh, left posterior thigh stage II, present on  admission -stage 2 sacrum, POA     Morbid obesity  Estimated body mass index is 58.83 kg/m as calculated from the following:   Height as of this encounter: 5' 8"  (1.727 m).   Weight as of this encounter: 175.5 kg.  Code Status: dnr DVT Prophylaxis:   apixaban (ELIQUIS) tablet 5 mg   Level of Care: Level of care: Progressive Cardiac Family Communication: Discussed all imaging results, lab results, explained to the patient   Disposition Plan:     Status is: Inpatient  Remains inpatient appropriate because:Inpatient level of care appropriate due to severity of illness   Dispo: The patient is from: Home              Anticipated d/c is to: SNF              Patient currently is medically stable to d/c.  Awaiting skilled nursing facility bed   Difficult to place patient Yes      Time Spent in minutes   25 minutes  Procedures:  None   Consultants:   Palliative medicine  Antimicrobials:   Anti-infectives (From admission, onward)   Start     Dose/Rate Route Frequency Ordered Stop   06/16/20 1000  nitrofurantoin (macrocrystal-monohydrate) (MACROBID) capsule 100 mg        100 mg Oral Every 12 hours 06/16/20 0843 06/16/20 2209   06/16/20 1000  cefTRIAXone (ROCEPHIN) 2 g in sodium chloride 0.9 % 100 mL IVPB        2 g 200 mL/hr over 30 Minutes Intravenous Every 24 hours 06/16/20 0843 06/20/20 1204   06/11/20 1200  nitrofurantoin (macrocrystal-monohydrate) (MACROBID) capsule 100 mg  Status:  Discontinued        100 mg Oral Every 12 hours 06/11/20 1026 06/16/20 0843   06/09/20 0300  vancomycin (VANCOREADY) IVPB 1750 mg/350 mL  Status:  Discontinued        1,750 mg 175 mL/hr over 120 Minutes Intravenous Every 12 hours 06/08/20 1610 06/09/20 0800   06/08/20 2200  metroNIDAZOLE (FLAGYL) IVPB 500 mg  Status:  Discontinued       Note to Pharmacy: He got one dose in the ED already. Please schedule appropriately.   500 mg 100 mL/hr over 60 Minutes Intravenous Every 8 hours 06/08/20  1436 06/09/20 0800   06/08/20 2000  ceFEPIme (MAXIPIME) 2 g in sodium chloride 0.9 % 100 mL IVPB  Status:  Discontinued        2 g 200 mL/hr over 30 Minutes Intravenous Every 8 hours 06/08/20 1447 06/09/20 0800   06/08/20 1445  metroNIDAZOLE (FLAGYL) IVPB 500 mg  Status:  Discontinued       Note to Pharmacy: He got one dose in the ED already. Please schedule appropriately.   500 mg 100 mL/hr over 60 Minutes Intravenous Every 8 hours 06/08/20 1434 06/08/20 1436   06/08/20 1445  vancomycin (VANCOCIN) IVPB 1000  mg/200 mL premix       "Followed by" Linked Group Details   1,000 mg 200 mL/hr over 60 Minutes Intravenous  Once 06/08/20 1441 06/08/20 1633   06/08/20 1445  vancomycin (VANCOREADY) IVPB 1500 mg/300 mL  Status:  Discontinued       "Followed by" Linked Group Details   1,500 mg 150 mL/hr over 120 Minutes Intravenous  Once 06/08/20 1441 06/09/20 0717   06/08/20 1100  ceFEPIme (MAXIPIME) 2 g in sodium chloride 0.9 % 100 mL IVPB        2 g 200 mL/hr over 30 Minutes Intravenous  Once 06/08/20 1047 06/08/20 1331   06/08/20 1100  metroNIDAZOLE (FLAGYL) IVPB 500 mg        500 mg 100 mL/hr over 60 Minutes Intravenous  Once 06/08/20 1047 06/08/20 1529   06/08/20 1100  vancomycin (VANCOCIN) IVPB 1000 mg/200 mL premix  Status:  Discontinued        1,000 mg 200 mL/hr over 60 Minutes Intravenous  Once 06/08/20 1047 06/08/20 1441         Medications  Scheduled Meds: . [START ON 06/21/2020] allopurinol  100 mg Oral Daily  . apixaban  5 mg Oral BID  . vitamin C  500 mg Oral BID  . aspirin  81 mg Oral Daily  . atorvastatin  40 mg Oral Daily  . Chlorhexidine Gluconate Cloth  6 each Topical Daily  . colchicine  0.6 mg Oral Daily  . DULoxetine  20 mg Oral Daily  . furosemide  60 mg Oral Daily  . gabapentin  300 mg Oral TID  . levothyroxine  75 mcg Oral QAC breakfast  . lisinopril  2.5 mg Oral Daily  . methocarbamol  750 mg Oral TID  . metoprolol tartrate  100 mg Oral BID  . montelukast   10 mg Oral QHS  . multivitamin with minerals  1 tablet Oral Daily  . polyethylene glycol  17 g Oral Daily  . predniSONE  40 mg Oral Q breakfast  . Ensure Max Protein  11 oz Oral TID WC  . thiamine  100 mg Oral Daily   Or  . thiamine  100 mg Intravenous Daily   Continuous Infusions:  PRN Meds:.acetaminophen **OR** acetaminophen, albuterol, alum & mag hydroxide-simeth, HYDROmorphone (DILAUDID) injection, LORazepam, meclizine, metoprolol tartrate, midodrine, nitroGLYCERIN, ondansetron **OR** ondansetron (ZOFRAN) IV, oxyCODONE, traZODone      Subjective:   [name] [name] was seen and examined today.  Currently alert and oriented.  He states pain in the feet and wrists is better however right shoulder continues to hurt.  No fevers.  No abdominal pain nausea or vomiting, diarrhea.  No chest pain.. No acute events overnight.    Objective:   Vitals:   06/19/20 2314 06/20/20 0552 06/20/20 0917 06/20/20 1207  BP:  133/88 127/76 132/77  Pulse:  86 75 82  Resp:  20  20  Temp:  98 F (36.7 C) 98.3 F (36.8 C) 98 F (36.7 C)  TempSrc:   Oral Oral  SpO2: 97% 100% 100% 100%  Weight:      Height:        Intake/Output Summary (Last 24 hours) at 06/20/2020 1451 Last data filed at 06/20/2020 1134 Gross per 24 hour  Intake --  Output 1800 ml  Net -1800 ml     Wt Readings from Last 3 Encounters:  06/19/20 (!) 175.5 kg  05/16/20 (!) 188.1 kg  04/30/20 (!) 186 kg   Physical Exam  General: Alert  and oriented x 3, NAD  Cardiovascular: S1 S2 clear, RRR.   Respiratory: CTAB  Gastrointestinal: Soft, nontender, nondistended, NBS  Ext: lower extremities wrapped  Neuro: no new deficits  Musculoskeletal: No cyanosis, clubbing, right shoulder ROM decreased, states due to pain  Skin: Stage I sacral decub, stage II bilateral thighs, lower extremities wrapped  Psych: Normal affect and demeanor, alert and oriented x3    Data Reviewed:  I have personally reviewed following labs and  imaging studies  Micro Results Recent Results (from the past 240 hour(s))  CULTURE, BLOOD (ROUTINE X 2) w Reflex to ID Panel     Status: None   Collection Time: 06/15/20  5:59 PM   Specimen: BLOOD  Result Value Ref Range Status   Specimen Description BLOOD BLOOD LEFT HAND  Final   Special Requests   Final    BOTTLES DRAWN AEROBIC AND ANAEROBIC Blood Culture results may not be optimal due to an inadequate volume of blood received in culture bottles   Culture   Final    NO GROWTH 5 DAYS Performed at [facility], [address] [place], [place] [address]    Report Status 06/20/2020 FINAL  Final  CULTURE, BLOOD (ROUTINE X 2) w Reflex to ID Panel     Status: None   Collection Time: 06/15/20  6:01 PM   Specimen: BLOOD  Result Value Ref Range Status   Specimen Description BLOOD BLOOD RIGHT HAND  Final   Special Requests   Final    BOTTLES DRAWN AEROBIC ONLY Blood Culture results may not be optimal due to an inadequate volume of blood received in culture bottles   Culture   Final    NO GROWTH 5 DAYS Performed at [facility], [address] [place], [place] [address]    Report Status 06/20/2020 FINAL  Final    Radiology Reports CT CHEST ABDOMEN PELVIS W CONTRAST  Result Date: 06/16/2020 CLINICAL DATA:  Fever of unknown origin EXAM: CT CHEST, ABDOMEN, AND PELVIS WITH CONTRAST TECHNIQUE: Multidetector CT imaging of the chest, abdomen and pelvis was performed following the standard protocol during bolus administration of intravenous contrast. CONTRAST:  149m OMNIPAQUE IOHEXOL 300 MG/ML  SOLN COMPARISON:  06/08/2020 FINDINGS: CT CHEST FINDINGS Cardiovascular: Thoracic aorta and its branches demonstrate mild atherosclerotic calcification. No aneurysmal dilatation or dissection is seen. Heart is mildly enlarged but stable. Coronary calcifications are noted. Although not timed for embolus evaluation common no large central pulmonary embolus is seen. Mediastinum/Nodes:  Thoracic inlet is within normal limits. Mild mediastinal lipomatosis is noted. No sizable hilar or mediastinal adenopathy is noted. The esophagus is within normal limits. Lungs/Pleura: Scattered atelectatic changes are noted throughout both lungs without focal confluent infiltrate. These changes have worsened in the interval from the prior exam. No sizable parenchymal nodule is seen. No sizable pleural effusion is noted. Musculoskeletal: Degenerative change of the thoracic spine is noted. Healing right anterior sixth rib fracture is noted. CT ABDOMEN PELVIS FINDINGS Hepatobiliary: Mild fatty infiltration of the liver is noted. Gallbladder is within normal limits. Pancreas: Unremarkable. No pancreatic ductal dilatation or surrounding inflammatory changes. Spleen: Normal in size without focal abnormality. Adrenals/Urinary Tract: Adrenal glands are within normal limits. Kidneys demonstrate a normal enhancement pattern bilaterally. A few scattered hypodensities are noted most consistent with renal cysts. No obstructive changes are seen. No renal calculi are noted. The bladder is decompressed. Stomach/Bowel: Scattered diverticular changes noted within the colon. No evidence of diverticulitis is seen. No obstructive changes of the colon  are noted. The appendix is within normal limits. The small bowel and stomach are unremarkable. Vascular/Lymphatic: Aortic atherosclerosis. No enlarged abdominal or pelvic lymph nodes. Reproductive: Prostate is unremarkable. Other: No abdominal wall hernia or abnormality. No abdominopelvic ascites. Musculoskeletal: Degenerative changes of lumbar spine and right hip joint are noted. No compression deformities are seen. IMPRESSION: CT of the chest: New scattered atelectatic changes bilaterally. This may contribute to the patient's underlying fever. No associated effusion is seen. No other acute abnormality is noted in the chest. CT of the abdomen and pelvis: No acute abnormality is noted.  Diverticular change without evidence of diverticulitis is noted. Scattered renal cysts are seen. Aortic Atherosclerosis (ICD10-I70.0). Electronically Signed   By: Inez Catalina M.D.   On: 06/16/2020 15:12   CT CHEST ABDOMEN PELVIS W CONTRAST  Result Date: 06/08/2020 CLINICAL DATA:  Fall this morning with abdominal pain. EXAM: CT CHEST, ABDOMEN, AND PELVIS WITH CONTRAST TECHNIQUE: Multidetector CT imaging of the chest, abdomen and pelvis was performed following the standard protocol during bolus administration of intravenous contrast. CONTRAST:  175m OMNIPAQUE IOHEXOL 300 MG/ML  SOLN COMPARISON:  Same day chest radiograph, chest CT dated 04/06/2020, abdominal ultrasound dated 03/10/2019. FINDINGS: CT CHEST FINDINGS Cardiovascular: Vascular calcifications are seen in the coronary arteries and aortic arch. The heart is mildly enlarged. No pericardial effusion. Mediastinum/Nodes: No enlarged mediastinal, hilar, or axillary lymph nodes. Thyroid gland, trachea, and esophagus demonstrate no significant findings. Lungs/Pleura: There is a mild left lower lobe atelectasis. The right lung is clear. There is no pleural effusion or pneumothorax. Musculoskeletal: There is a subacute to chronic fracture of the anterior right sixth rib. This appears new since 04/06/2020. Degenerative changes are seen in the spine, most significant at T8-9. CT ABDOMEN PELVIS FINDINGS Hepatobiliary: The liver is hypoattenuating, consistent with hepatic steatosis. No focal liver abnormality is seen. No gallstones, gallbladder wall thickening, or biliary dilatation. Pancreas: Unremarkable. No pancreatic ductal dilatation or surrounding inflammatory changes. Spleen: Normal in size without focal abnormality. Adrenals/Urinary Tract: Adrenal glands are unremarkable. Kidneys are normal, without renal calculi, focal lesion, or hydronephrosis. Bladder is unremarkable. Stomach/Bowel: Stomach is within normal limits. Appendix appears normal. There is  colonic diverticulosis without evidence of diverticulitis. No evidence of bowel wall thickening, distention, or inflammatory changes. Vascular/Lymphatic: Aortic atherosclerosis. No enlarged abdominal or pelvic lymph nodes. Reproductive: Prostate is unremarkable. Other: No abdominal wall hernia or abnormality. No abdominopelvic ascites. Musculoskeletal: Degenerative changes are seen in the spine, most significant at L1-2 and L3-4. Severe degenerative changes are seen in the right hip. IMPRESSION: 1. Subacute to chronic fracture of the anterior right sixth rib. 2. Hepatic steatosis. Aortic Atherosclerosis (ICD10-I70.0). Electronically Signed   By: TZerita BoersM.D.   On: 06/08/2020 11:55   DG Chest Port 1 View  Result Date: 06/15/2020 CLINICAL DATA:  Fever, history of COPD, asthma EXAM: PORTABLE CHEST 1 VIEW COMPARISON:  06/08/2020 FINDINGS: Single frontal view of the chest demonstrates stable enlargement of the cardiac silhouette. Chronic central vascular congestion without airspace disease, effusion, or pneumothorax. No acute bony abnormalities. IMPRESSION: 1. Stable exam, no acute process. Electronically Signed   By: MRanda NgoM.D.   On: 06/15/2020 16:58   DG Chest Port 1 View  Result Date: 06/08/2020 CLINICAL DATA:  Questionable sepsis EXAM: PORTABLE CHEST 1 VIEW COMPARISON:  05/04/2020 FINDINGS: Cardiomegaly and vascular congestion, chronic. No Kerley lines. No air bronchogram. Hazy right apical pleural thickening which is stable. IMPRESSION: Cardiomegaly and vascular congestion. Electronically Signed   By: JMonte Fantasia  M.D.   On: 06/08/2020 09:54    Lab Data:  CBC: Recent Labs  Lab 06/15/20 1758 06/16/20 0559 06/17/20 0507 06/18/20 0071 06/19/20 0349 06/20/20 0733  WBC 6.4 7.9 6.6 6.8 4.0 4.6  NEUTROABS 4.2  --   --   --   --   --   HGB 11.3* 11.1* 11.1* 10.5* 10.1* 11.1*  HCT 36.2* 34.5* 33.2* 31.8* 31.4* 34.9*  MCV 100.6* 99.1 96.2 96.4 97.8 99.4  PLT 198 181 199 258 243 219    Basic Metabolic Panel: Recent Labs  Lab 06/16/20 0559 06/17/20 0507 06/18/20 0338 06/19/20 0349 06/20/20 0733  NA 132* 133* 131* 134* 136  K 3.5 3.1* 4.2 4.3 4.0  CL 77* 78* 80* 84* 85*  CO2 44* 42* 40* 39* 43*  GLUCOSE 147* 101* 135* 139* 169*  BUN 18 16 33* 36* 26*  CREATININE 0.58* 0.50* 0.69 0.65 0.67  CALCIUM 8.9 8.6* 8.6* 8.7* 8.9  MG 1.6* 1.7 1.9 1.9 1.8  PHOS 2.6 3.6 5.1* 4.1 3.0   GFR: Estimated Creatinine Clearance: 156.4 mL/min (by C-G formula based on SCr of 0.67 mg/dL). Liver Function Tests: Recent Labs  Lab 06/15/20 1758  AST 28  ALT 27  ALKPHOS 70  BILITOT 1.0  PROT 6.2*  ALBUMIN 2.7*   No results for input(s): LIPASE, AMYLASE in the last 168 hours. No results for input(s): AMMONIA in the last 168 hours. Coagulation Profile: Recent Labs  Lab 06/15/20 1758  INR 1.4*   Cardiac Enzymes: No results for input(s): CKTOTAL, CKMB, CKMBINDEX, TROPONINI in the last 168 hours. BNP (last 3 results) No results for input(s): PROBNP in the last 8760 hours. HbA1C: No results for input(s): HGBA1C in the last 72 hours. CBG: No results for input(s): GLUCAP in the last 168 hours. Lipid Profile: No results for input(s): CHOL, HDL, LDLCALC, TRIG, CHOLHDL, LDLDIRECT in the last 72 hours. Thyroid Function Tests: No results for input(s): TSH, T4TOTAL, FREET4, T3FREE, THYROIDAB in the last 72 hours. Anemia Panel: No results for input(s): VITAMINB12, FOLATE, FERRITIN, TIBC, IRON, RETICCTPCT in the last 72 hours. Urine analysis:    Component Value Date/Time   COLORURINE AMBER (A) 06/15/2020 1620   APPEARANCEUR HAZY (A) 06/15/2020 1620   APPEARANCEUR Hazy (A) 12/25/2018 1442   LABSPEC 1.021 06/15/2020 1620   PHURINE 8.0 06/15/2020 1620   GLUCOSEU NEGATIVE 06/15/2020 1620   HGBUR NEGATIVE 06/15/2020 1620   BILIRUBINUR NEGATIVE 06/15/2020 1620   BILIRUBINUR Negative 12/25/2018 1442   KETONESUR NEGATIVE 06/15/2020 1620   PROTEINUR NEGATIVE 06/15/2020 1620    NITRITE NEGATIVE 06/15/2020 1620   LEUKOCYTESUR NEGATIVE 06/15/2020 1620     Clover Feehan M.D. [facility] 06/20/2020, 2:51 PM  Available via Epic secure chat 7am-7pm After 7 pm, please refer to night coverage provider listed on amion.

## 2020-06-20 NOTE — TOC Progression Note (Signed)
Transition of Care [facility]) - Progression Note    Patient Details  Name: [name] [name] MRN: [id number] Date of Birth: September 12, 1960  [facility] of [facility]) CM/SW Contact  Chapman Fitch, RN Phone Number: 06/20/2020, 3:27 PM  Clinical Narrative:     No SNF bed offers.  Patient on difficult to place list   Expected Discharge Plan: Skilled Nursing Facility Barriers to Discharge: Continued Medical Work up  Expected Discharge Plan and Services Expected Discharge Plan: Skilled Nursing Facility     Post Acute Care Choice: Skilled Nursing Facility Living arrangements for the past 2 months: Apartment                   DME Agency: [facility] Date DME Agency Contacted: 06/12/20 Time DME Agency Contacted: [phone] Representative spoke with at DME Agency: Delila Spence reporesentative stated they woudl not continue services for the patient due to intoxication.             Social Determinants of Health (SDOH) Interventions    Readmission Risk Interventions Readmission Risk Prevention Plan 01/11/2020 12/24/2019 01/25/2019  Transportation Screening Complete Complete Complete  PCP or Specialist Appt within 3-5 Days - - -  HRI or Home Care Consult - - -  Social Work Consult for Recovery Care Planning/Counseling - - -  Palliative Care Screening - - -  Medication Review Oceanographer) Complete Complete Complete  PCP or Specialist appointment within 3-5 days of discharge Complete Complete Complete  HRI or Home Care Consult - - Complete  SW Recovery Care/Counseling Consult Complete Complete Complete  Palliative Care Screening Complete Not Applicable Not Applicable  Skilled Nursing Facility Not Applicable Not Applicable Not Applicable

## 2020-06-20 NOTE — Consult Note (Signed)
WOC Nurse Consult Note: Reason for Consult: patient request Unna's boot to be replaced.  However it was noted that his uNna's boots are intact, bedside nurse has taken one off per my request and taking off the second one. Wound type: none  Dressing procedure/placement/frequency: Washed legs after Unna's boots removed Reapplied bilateral Unna's boots, patient tolerated well.  Will update orders for QFriday changes.   [name] [name] [facility], CNS, [employer] [phone]

## 2020-06-21 ENCOUNTER — Inpatient Hospital Stay: Payer: Medicaid Other

## 2020-06-21 DIAGNOSIS — F10230 Alcohol dependence with withdrawal, uncomplicated: Secondary | ICD-10-CM | POA: Diagnosis not present

## 2020-06-21 DIAGNOSIS — F1010 Alcohol abuse, uncomplicated: Secondary | ICD-10-CM | POA: Diagnosis not present

## 2020-06-21 DIAGNOSIS — I482 Chronic atrial fibrillation, unspecified: Secondary | ICD-10-CM | POA: Diagnosis not present

## 2020-06-21 DIAGNOSIS — I4891 Unspecified atrial fibrillation: Secondary | ICD-10-CM | POA: Diagnosis not present

## 2020-06-21 LAB — CBC
HCT: 36 % — ABNORMAL LOW (ref 39.0–52.0)
Hemoglobin: 11.3 g/dL — ABNORMAL LOW (ref 13.0–17.0)
MCH: 31.7 pg (ref 26.0–34.0)
MCHC: 31.4 g/dL (ref 30.0–36.0)
MCV: 101.1 fL — ABNORMAL HIGH (ref 80.0–100.0)
Platelets: 297 10*3/uL (ref 150–400)
RBC: 3.56 MIL/uL — ABNORMAL LOW (ref 4.22–5.81)
RDW: 16.6 % — ABNORMAL HIGH (ref 11.5–15.5)
WBC: 5.7 10*3/uL (ref 4.0–10.5)
nRBC: 0 % (ref 0.0–0.2)

## 2020-06-21 LAB — BASIC METABOLIC PANEL
Anion gap: 9 (ref 5–15)
BUN: 25 mg/dL — ABNORMAL HIGH (ref 6–20)
CO2: 40 mmol/L — ABNORMAL HIGH (ref 22–32)
Calcium: 8.6 mg/dL — ABNORMAL LOW (ref 8.9–10.3)
Chloride: 87 mmol/L — ABNORMAL LOW (ref 98–111)
Creatinine, Ser: 0.69 mg/dL (ref 0.61–1.24)
GFR, Estimated: >60 mL/min (ref >60)
Glucose, Bld: 98 mg/dL (ref 70–99)
Potassium: 4.4 mmol/L (ref 3.5–5.1)
Sodium: 136 mmol/L (ref 135–145)

## 2020-06-21 LAB — PHOSPHORUS: Phosphorus: 4 mg/dL (ref 2.5–4.6)

## 2020-06-21 LAB — MAGNESIUM: Magnesium: 1.9 mg/dL (ref 1.7–2.4)

## 2020-06-21 NOTE — Progress Notes (Signed)
Occupational Therapy Treatment Patient Details Name: [name] [name] MRN: [id number] DOB: 06-Dec-1960 Today's Date: 06/21/2020    History of present illness Pt is a 60 y.o. 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  MD assessment includes: SIRS, acute on chronic respiratory failure, A-fib with RVR, alcohol withdrawal, R 6th rib Fx, and FTT.   OT comments  Pt requires increased encouragement to participate this date. He is reluctant d/t gout pain in LEs and not yet having MRI to R UE. That being said, he is agreeable to core/trunk exercise with encouragement. OT engages pt in core/trunk exercises to improve balance and INDEP wtih bed mobility (sup<>sit). Pt participates in 2 sets contralateral reaching x5 per side with 2-3 minute bed level rest break between exercise and 2 sets x5 reps FWD reaching/modified bed level situp with 2-3 min rest break between sets. Visual and verbal cues for form, pace and technique. Pt left with all needs met and in reach. Will continue to follow and continue to recommend rehabilitation following acute stay as pt still presents with generally limited functional activity tolerance and strength to safely complete self care in the home environment.    Follow Up Recommendations  SNF    Equipment Recommendations  Other (comment) (defer)    Recommendations for Other Services      Precautions / Restrictions Precautions Precautions:  Fall Restrictions Weight Bearing Restrictions: No       Mobility Bed Mobility                    Transfers                      Balance                                           ADL either performed or assessed with clinical judgement   ADL                                               Vision Patient Visual Report: No change from baseline     Perception     Praxis      Cognition Arousal/Alertness: Awake/alert Behavior During Therapy: WFL for tasks assessed/performed Overall Cognitive Status: Within Functional Limits for tasks assessed                                 General Comments: Pt A and O x 4        Exercises Other Exercises Other Exercises: OT engages pt in core/trunk exercises to improve balance and INDEP wtih bed mobility (sup<>sit)  in 2 sets contralateral reaching x5 per side with 2-3 minute bed level rest break between exercise and 2 sets x5 reps FWD reaching/modified bed level situp with 2-3 min rest break between  sets. Visual and verbal cues for form, pace and technique.   Shoulder Instructions       General Comments      Pertinent Vitals/ Pain       Pain Assessment: Faces Faces Pain Scale: Hurts a little bit Pain Location: R foot > L (gout) and R shoulder Pain Descriptors / Indicators: Nagging;Tender Pain Intervention(s): Limited activity within patient's tolerance;Monitored during session  Home Living                                          Prior Functioning/Environment              Frequency  Min 2X/week        Progress Toward Goals  OT Goals(current goals can now be found in the care plan section)  Progress towards OT goals: Progressing toward goals  Acute Rehab OT Goals Patient Stated Goal: none stated OT Goal Formulation: With patient Time For Goal Achievement: 06/26/20 Potential to Achieve Goals: Good  Plan Discharge plan remains  appropriate    Co-evaluation                 AM-PAC OT "6 Clicks" Daily Activity     Outcome Measure   Help from another person eating meals?: A Little Help from another person taking care of personal grooming?: A Little Help from another person toileting, which includes using toliet, bedpan, or urinal?: Total Help from another person bathing (including washing, rinsing, drying)?: A Lot Help from another person to put on and taking off regular upper body clothing?: A Little Help from another person to put on and taking off regular lower body clothing?: A Lot 6 Click Score: 14    End of Session Equipment Utilized During Treatment: Oxygen  OT Visit Diagnosis: Unsteadiness on feet (R26.81);Repeated falls (R29.6);Muscle weakness (generalized) (M62.81)   Activity Tolerance Patient limited by fatigue;Patient limited by pain;Other (comment) (somewhat self-limiting)   Patient Left in bed;with call bell/phone within reach;with nursing/sitter in room   Nurse Communication Mobility status        Time: 8251-8984 OT Time Calculation (min): 18 min  Charges: OT General Charges $OT Visit: 1 Visit OT Treatments $Therapeutic Exercise: 8-22 mins  Gerrianne Scale, [place], OTR/L ascom [phone] 06/21/20, 5:03 PM

## 2020-06-21 NOTE — Progress Notes (Signed)
[facility]                                                                              Patient Demographics  [name] [name], is a 60 y.o. male, DOB - 03/14/1960, MWU:[id number]  Admit date - 06/08/2020   Admitting Physician Amy August Luz, DO  Outpatient Primary MD for the patient is [name], DO  Outpatient specialists:   LOS - 13  days   Medical records reviewed and are as summarized below:    Chief Complaint  Patient presents with  . 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 fib, OSA, history of DVT, GERD, hypothyroid, hyperlipidemia, history of PE, chronic bilateral lower extremity venous stasis, presented with a fall.  Denied any loss of consciousness or hitting his head.  Reported abdominal pain.He told EMS that he did not want to go to the hospital and that he just wants to be left alone to die. On admission, reported nausea, baseline shortness of breath, alcohol use 4 to 5 hours prior to presentation.  Denied any SI and HI.[name] Presentation consistent with acute on chronic respiratory failure likely secondary to COPD/CHF.  Remains stepdown status.  Was on BiPAP since admission, taken off on 4/25, now tolerating O2 via [place].  Patient has been seen by palliative medicine.  DNR/DNI, would like to treat the treatable.    Assessment & Plan   Acute on chronic respiratory failure with hypoxia with underlying COPD, OSA, OHS - Intermittently requiring BiPAP -Continue O2, BiPAP as needed.  Outpatient on 2 to 3 L chronically. -2D echo 04/07/2020 had shown EF of 30 to 72%, diastolic parameters normal -- O2 sats improving, 100% on 5 L, wean O2 as tolerated for sats 90 to 93%.   Acute on chronic systolic CHF  exacerbation -2D echo 04/07/2020 had shown EF of 30 to 53%, diastolic parameters normal -Continue Lasix 60 mg daily, beta-blocker  -Negative balance of 6.2 L, creatinine stable  Acute gout, possible cellulitis, atelectasis -On 5/1 and 5/2, patient had mild fever with bilateral feet and wrist pain, states consistent with his gout flares,  -Blood cultures negative to date, some erythema and tenderness of left lower extremity, has venous stasis dermatitis.  Discontinue Rocephin -ESR 70, uric acid 6.5, CRP 10.2.  Continue colchicine, prednisone (started on 5/2).,  -Continue allopurinol r  Right shoulder pain, chronic - Right Shoulder x-ray on 3/6 was negative for any dislocation or fracture -MRI of the right shoulder showed full-thickness rotator cuff tear, orthopedics consulted -Continue pain control, on Robaxin, oxycodone 10 mg every 6 hours as needed  Aerococcus bacteriuria -Typically a contaminant but pt endorses dysuria. Dr Si Raider discussed w/ pharm, nitrofurantoin typically covers - Received 5 days of Macrobid  Atrial fibrillation with RVR -Likely due to acute alcohol withdrawal, has been weaned off of Cardizem drip -Currently heart rate controlled, continue metoprolol -Continue eliquis  Alcohol abuse -Drinks half a gallon of whiskey  daily, no alcohol withdrawals currently Continue thiamine, Ativan as needed  Right sixth rib fracture Likely due to fall, acute to subacute -Continue pain control  Essential hypertension Stable, continue beta-blocker  Hypokalemia, hyponatremia -Resolved  Macrocytosis -Likely due to alcohol use, B12 folate normal  Depression Continue duloxetine  Hypothyroidism TSH 11.4, continue levothyroxine Compliance unclear, will need repeat TSH in 4 weeks.  OSA, OHS, morbid obesity Continue BiPAP qhs  Failure to thrive -APS involved and pursuing guardianship.  APS wants patient to remain here for the time being.  Unable to safely care for  himself at home.  Will need skilled nursing care, high risk for readmissions.  Pressure injury -Right thigh, left posterior thigh stage II, present on admission -stage 2 sacrum, POA     Morbid obesity  Estimated body mass index is 58.58 kg/m as calculated from the following:   Height as of this encounter: 5' 8"  (1.727 m).   Weight as of this encounter: 174.8 kg.  Code Status: dnr DVT Prophylaxis:   apixaban (ELIQUIS) tablet 5 mg   Level of Care: Level of care: Progressive Cardiac Family Communication: Discussed all imaging results, lab results, explained to the patient   Disposition Plan:     Status is: Inpatient  Remains inpatient appropriate because:Inpatient level of care appropriate due to severity of illness   Dispo: The patient is from: Home              Anticipated d/c is to: SNF              Patient currently is medically stable to d/c.  Awaiting skilled nursing facility bed Orthopedics consulted for rotator cuff injury   Difficult to place patient Yes      Time Spent in minutes   25 minutes  Procedures:  None   Consultants:   Palliative medicine Orthopedics  Antimicrobials:   Anti-infectives (From admission, onward)   Start     Dose/Rate Route Frequency Ordered Stop   06/16/20 1000  nitrofurantoin (macrocrystal-monohydrate) (MACROBID) capsule 100 mg        100 mg Oral Every 12 hours 06/16/20 0843 06/16/20 2209   06/16/20 1000  cefTRIAXone (ROCEPHIN) 2 g in sodium chloride 0.9 % 100 mL IVPB        2 g 200 mL/hr over 30 Minutes Intravenous Every 24 hours 06/16/20 0843 06/20/20 1204   06/11/20 1200  nitrofurantoin (macrocrystal-monohydrate) (MACROBID) capsule 100 mg  Status:  Discontinued        100 mg Oral Every 12 hours 06/11/20 1026 06/16/20 0843   06/09/20 0300  vancomycin (VANCOREADY) IVPB 1750 mg/350 mL  Status:  Discontinued        1,750 mg 175 mL/hr over 120 Minutes Intravenous Every 12 hours 06/08/20 1610 06/09/20 0800   06/08/20 2200   metroNIDAZOLE (FLAGYL) IVPB 500 mg  Status:  Discontinued       Note to Pharmacy: He got one dose in the ED already. Please schedule appropriately.   500 mg 100 mL/hr over 60 Minutes Intravenous Every 8 hours 06/08/20 1436 06/09/20 0800   06/08/20 2000  ceFEPIme (MAXIPIME) 2 g in sodium chloride 0.9 % 100 mL IVPB  Status:  Discontinued        2 g 200 mL/hr over 30 Minutes Intravenous Every 8 hours 06/08/20 1447 06/09/20 0800   06/08/20 1445  metroNIDAZOLE (FLAGYL) IVPB 500 mg  Status:  Discontinued       Note to Pharmacy: He got one dose in the ED already.  Please schedule appropriately.   500 mg 100 mL/hr over 60 Minutes Intravenous Every 8 hours 06/08/20 1434 06/08/20 1436   06/08/20 1445  vancomycin (VANCOCIN) IVPB 1000 mg/200 mL premix       "Followed by" Linked Group Details   1,000 mg 200 mL/hr over 60 Minutes Intravenous  Once 06/08/20 1441 06/08/20 1633   06/08/20 1445  vancomycin (VANCOREADY) IVPB 1500 mg/300 mL  Status:  Discontinued       "Followed by" Linked Group Details   1,500 mg 150 mL/hr over 120 Minutes Intravenous  Once 06/08/20 1441 06/09/20 0717   06/08/20 1100  ceFEPIme (MAXIPIME) 2 g in sodium chloride 0.9 % 100 mL IVPB        2 g 200 mL/hr over 30 Minutes Intravenous  Once 06/08/20 1047 06/08/20 1331   06/08/20 1100  metroNIDAZOLE (FLAGYL) IVPB 500 mg        500 mg 100 mL/hr over 60 Minutes Intravenous  Once 06/08/20 1047 06/08/20 1529   06/08/20 1100  vancomycin (VANCOCIN) IVPB 1000 mg/200 mL premix  Status:  Discontinued        1,000 mg 200 mL/hr over 60 Minutes Intravenous  Once 06/08/20 1047 06/08/20 1441         Medications  Scheduled Meds: . allopurinol  100 mg Oral Daily  . apixaban  5 mg Oral BID  . vitamin C  500 mg Oral BID  . aspirin  81 mg Oral Daily  . atorvastatin  40 mg Oral Daily  . Chlorhexidine Gluconate Cloth  6 each Topical Daily  . colchicine  0.6 mg Oral Daily  . DULoxetine  20 mg Oral Daily  . furosemide  60 mg Oral Daily  .  gabapentin  300 mg Oral TID  . levothyroxine  75 mcg Oral QAC breakfast  . lisinopril  2.5 mg Oral Daily  . methocarbamol  750 mg Oral TID  . metoprolol tartrate  100 mg Oral BID  . montelukast  10 mg Oral QHS  . multivitamin with minerals  1 tablet Oral Daily  . polyethylene glycol  17 g Oral Daily  . predniSONE  40 mg Oral Q breakfast  . Ensure Max Protein  11 oz Oral TID WC  . thiamine  100 mg Oral Daily   Or  . thiamine  100 mg Intravenous Daily   Continuous Infusions:  PRN Meds:.acetaminophen **OR** acetaminophen, albuterol, alum & mag hydroxide-simeth, HYDROmorphone (DILAUDID) injection, LORazepam, meclizine, metoprolol tartrate, midodrine, nitroGLYCERIN, ondansetron **OR** ondansetron (ZOFRAN) IV, oxyCODONE, traZODone      Subjective:   Jaimere Feutz was seen and examined today.  No acute chest pain.  States right shoulder continues to hurt.  Decreased range of movement with abduction and extension.  No abdominal pain nausea or vomiting, diarrhea.  No fevers  Objective:   Vitals:   06/20/20 1958 06/21/20 0554 06/21/20 0818 06/21/20 1254  BP: (!) 120/58 106/63 123/86 (!) 145/61  Pulse: 80 73 72 80  Resp: 18 19 20 18   Temp: 98.3 F (36.8 C) 98.6 F (37 C) 98.2 F (36.8 C) 98 F (36.7 C)  TempSrc:   Oral Oral  SpO2: 100% 100% 100% 100%  Weight:  (!) 174.8 kg    Height:        Intake/Output Summary (Last 24 hours) at 06/21/2020 1343 Last data filed at 06/21/2020 1247 Gross per 24 hour  Intake 2280 ml  Output 1700 ml  Net 580 ml     Wt Readings from Last 3  Encounters:  06/21/20 (!) 174.8 kg  05/16/20 (!) 188.1 kg  04/30/20 (!) 186 kg    Physical Exam  General: Alert and oriented x 3, NAD  Cardiovascular: S1 S2 clear, RRR. No pedal edema b/l  Respiratory: Fairly CTAB  Gastrointestinal: Soft, nontender, nondistended, NBS  Ext: lower extremities wrapped,   Neuro: no new deficits  Musculoskeletal: Decreased ROM right shoulder  Skin: Lower  extremities wrapped, stage I sacral decub  Psych: Normal affect and demeanor, alert and oriented x3     Data Reviewed:  I have personally reviewed following labs and imaging studies  Micro Results Recent Results (from the past 240 hour(s))  CULTURE, BLOOD (ROUTINE X 2) w Reflex to ID Panel     Status: None   Collection Time: 06/15/20  5:59 PM   Specimen: BLOOD  Result Value Ref Range Status   Specimen Description BLOOD BLOOD LEFT HAND  Final   Special Requests   Final    BOTTLES DRAWN AEROBIC AND ANAEROBIC Blood Culture results may not be optimal due to an inadequate volume of blood received in culture bottles   Culture   Final    NO GROWTH 5 DAYS Performed at [facility], [address] [place], [place] [address]    Report Status 06/20/2020 FINAL  Final  CULTURE, BLOOD (ROUTINE X 2) w Reflex to ID Panel     Status: None   Collection Time: 06/15/20  6:01 PM   Specimen: BLOOD  Result Value Ref Range Status   Specimen Description BLOOD BLOOD RIGHT HAND  Final   Special Requests   Final    BOTTLES DRAWN AEROBIC ONLY Blood Culture results may not be optimal due to an inadequate volume of blood received in culture bottles   Culture   Final    NO GROWTH 5 DAYS Performed at [facility], [address] [place], [place] [address]    Report Status 06/20/2020 FINAL  Final    Radiology Reports MR SHOULDER RIGHT WO CONTRAST  Result Date: 06/21/2020 CLINICAL DATA:  Chronic right shoulder pain with limited range of motion. No acute injury or prior relevant surgery. EXAM: MRI OF THE RIGHT SHOULDER WITHOUT CONTRAST TECHNIQUE: Multiplanar, multisequence MR imaging of the shoulder was performed. No intravenous contrast was administered. COMPARISON:  Radiographs 04/20/2020 FINDINGS: Despite efforts by the technologist and patient, moderate motion artifact is present on today's exam and could not be eliminated. This reduces exam sensitivity and specificity. Patient was unable  to complete the study. No sagittal T1 weighted images were obtained. Rotator cuff: There is a large full-thickness rotator cuff tear with full-thickness retracted insertional tears of the supraspinatus and infraspinatus tendons. Both tendons are retracted nearly to the level of the glenoid. The subscapularis and teres minor tendons appear intact. Muscles: There is edema within and surrounding the infraspinatus muscle. There is mild atrophy of the supraspinatus and infraspinatus muscles. Biceps long head:  Intact and normally positioned. Acromioclavicular Joint: The acromion is type 1. There are mild acromioclavicular degenerative changes. A moderate amount of fluid in the subacromial-subdeltoid bursa communicates with the shoulder joint via the full-thickness rotator cuff tear. Glenohumeral Joint: Small shoulder joint effusion and moderate glenohumeral degenerative changes. The subacromial space appears narrowed. Labrum: Labral assessment is limited by the motion. The posteroinferior labrum appears degenerated without discrete tear or paralabral cyst. Bones: No acute or significant extra-articular osseous findings. Other: Soft tissue edema extends posterior to the shoulder without focal fluid collection. IMPRESSION: 1. Large full-thickness rotator cuff  tear as described moderate traction of the supraspinatus and infraspinatus tendons. Mild muscular atrophy and moderate edema within and surrounding the infraspinatus muscle. 2. Moderate glenohumeral degenerative changes with posteroinferior labral degeneration. 3. The biceps tendon appears intact. Electronically Signed   By: Richardean Sale M.D.   On: 06/21/2020 12:44   CT CHEST ABDOMEN PELVIS W CONTRAST  Result Date: 06/16/2020 CLINICAL DATA:  Fever of unknown origin EXAM: CT CHEST, ABDOMEN, AND PELVIS WITH CONTRAST TECHNIQUE: Multidetector CT imaging of the chest, abdomen and pelvis was performed following the standard protocol during bolus administration of  intravenous contrast. CONTRAST:  120m OMNIPAQUE IOHEXOL 300 MG/ML  SOLN COMPARISON:  06/08/2020 FINDINGS: CT CHEST FINDINGS Cardiovascular: Thoracic aorta and its branches demonstrate mild atherosclerotic calcification. No aneurysmal dilatation or dissection is seen. Heart is mildly enlarged but stable. Coronary calcifications are noted. Although not timed for embolus evaluation common no large central pulmonary embolus is seen. Mediastinum/Nodes: Thoracic inlet is within normal limits. Mild mediastinal lipomatosis is noted. No sizable hilar or mediastinal adenopathy is noted. The esophagus is within normal limits. Lungs/Pleura: Scattered atelectatic changes are noted throughout both lungs without focal confluent infiltrate. These changes have worsened in the interval from the prior exam. No sizable parenchymal nodule is seen. No sizable pleural effusion is noted. Musculoskeletal: Degenerative change of the thoracic spine is noted. Healing right anterior sixth rib fracture is noted. CT ABDOMEN PELVIS FINDINGS Hepatobiliary: Mild fatty infiltration of the liver is noted. Gallbladder is within normal limits. Pancreas: Unremarkable. No pancreatic ductal dilatation or surrounding inflammatory changes. Spleen: Normal in size without focal abnormality. Adrenals/Urinary Tract: Adrenal glands are within normal limits. Kidneys demonstrate a normal enhancement pattern bilaterally. A few scattered hypodensities are noted most consistent with renal cysts. No obstructive changes are seen. No renal calculi are noted. The bladder is decompressed. Stomach/Bowel: Scattered diverticular changes noted within the colon. No evidence of diverticulitis is seen. No obstructive changes of the colon are noted. The appendix is within normal limits. The small bowel and stomach are unremarkable. Vascular/Lymphatic: Aortic atherosclerosis. No enlarged abdominal or pelvic lymph nodes. Reproductive: Prostate is unremarkable. Other: No abdominal  wall hernia or abnormality. No abdominopelvic ascites. Musculoskeletal: Degenerative changes of lumbar spine and right hip joint are noted. No compression deformities are seen. IMPRESSION: CT of the chest: New scattered atelectatic changes bilaterally. This may contribute to the patient's underlying fever. No associated effusion is seen. No other acute abnormality is noted in the chest. CT of the abdomen and pelvis: No acute abnormality is noted. Diverticular change without evidence of diverticulitis is noted. Scattered renal cysts are seen. Aortic Atherosclerosis (ICD10-I70.0). Electronically Signed   By: MInez CatalinaM.D.   On: 06/16/2020 15:12   CT CHEST ABDOMEN PELVIS W CONTRAST  Result Date: 06/08/2020 CLINICAL DATA:  Fall this morning with abdominal pain. EXAM: CT CHEST, ABDOMEN, AND PELVIS WITH CONTRAST TECHNIQUE: Multidetector CT imaging of the chest, abdomen and pelvis was performed following the standard protocol during bolus administration of intravenous contrast. CONTRAST:  1064mOMNIPAQUE IOHEXOL 300 MG/ML  SOLN COMPARISON:  Same day chest radiograph, chest CT dated 04/06/2020, abdominal ultrasound dated 03/10/2019. FINDINGS: CT CHEST FINDINGS Cardiovascular: Vascular calcifications are seen in the coronary arteries and aortic arch. The heart is mildly enlarged. No pericardial effusion. Mediastinum/Nodes: No enlarged mediastinal, hilar, or axillary lymph nodes. Thyroid gland, trachea, and esophagus demonstrate no significant findings. Lungs/Pleura: There is a mild left lower lobe atelectasis. The right lung is clear. There is no pleural effusion or pneumothorax.  Musculoskeletal: There is a subacute to chronic fracture of the anterior right sixth rib. This appears new since 04/06/2020. Degenerative changes are seen in the spine, most significant at T8-9. CT ABDOMEN PELVIS FINDINGS Hepatobiliary: The liver is hypoattenuating, consistent with hepatic steatosis. No focal liver abnormality is seen. No  gallstones, gallbladder wall thickening, or biliary dilatation. Pancreas: Unremarkable. No pancreatic ductal dilatation or surrounding inflammatory changes. Spleen: Normal in size without focal abnormality. Adrenals/Urinary Tract: Adrenal glands are unremarkable. Kidneys are normal, without renal calculi, focal lesion, or hydronephrosis. Bladder is unremarkable. Stomach/Bowel: Stomach is within normal limits. Appendix appears normal. There is colonic diverticulosis without evidence of diverticulitis. No evidence of bowel wall thickening, distention, or inflammatory changes. Vascular/Lymphatic: Aortic atherosclerosis. No enlarged abdominal or pelvic lymph nodes. Reproductive: Prostate is unremarkable. Other: No abdominal wall hernia or abnormality. No abdominopelvic ascites. Musculoskeletal: Degenerative changes are seen in the spine, most significant at L1-2 and L3-4. Severe degenerative changes are seen in the right hip. IMPRESSION: 1. Subacute to chronic fracture of the anterior right sixth rib. 2. Hepatic steatosis. Aortic Atherosclerosis (ICD10-I70.0). Electronically Signed   By: Zerita Boers M.D.   On: 06/08/2020 11:55   DG Chest Port 1 View  Result Date: 06/15/2020 CLINICAL DATA:  Fever, history of COPD, asthma EXAM: PORTABLE CHEST 1 VIEW COMPARISON:  06/08/2020 FINDINGS: Single frontal view of the chest demonstrates stable enlargement of the cardiac silhouette. Chronic central vascular congestion without airspace disease, effusion, or pneumothorax. No acute bony abnormalities. IMPRESSION: 1. Stable exam, no acute process. Electronically Signed   By: Randa Ngo M.D.   On: 06/15/2020 16:58   DG Chest Port 1 View  Result Date: 06/08/2020 CLINICAL DATA:  Questionable sepsis EXAM: PORTABLE CHEST 1 VIEW COMPARISON:  05/04/2020 FINDINGS: Cardiomegaly and vascular congestion, chronic. No Kerley lines. No air bronchogram. Hazy right apical pleural thickening which is stable. IMPRESSION: Cardiomegaly and  vascular congestion. Electronically Signed   By: Monte Fantasia M.D.   On: 06/08/2020 09:54    Lab Data:  CBC: Recent Labs  Lab 06/15/20 1758 06/16/20 0559 06/17/20 0507 06/18/20 0109 06/19/20 0349 06/20/20 0733 06/21/20 0651  WBC 6.4   < > 6.6 6.8 4.0 4.6 5.7  NEUTROABS 4.2  --   --   --   --   --   --   HGB 11.3*   < > 11.1* 10.5* 10.1* 11.1* 11.3*  HCT 36.2*   < > 33.2* 31.8* 31.4* 34.9* 36.0*  MCV 100.6*   < > 96.2 96.4 97.8 99.4 101.1*  PLT 198   < > 199 258 243 319 297   < > = values in this interval not displayed.   Basic Metabolic Panel: Recent Labs  Lab 06/17/20 0507 06/18/20 0338 06/19/20 0349 06/20/20 0733 06/21/20 0651  NA 133* 131* 134* 136 136  K 3.1* 4.2 4.3 4.0 4.4  CL 78* 80* 84* 85* 87*  CO2 42* 40* 39* 43* 40*  GLUCOSE 101* 135* 139* 169* 98  BUN 16 33* 36* 26* 25*  CREATININE 0.50* 0.69 0.65 0.67 0.69  CALCIUM 8.6* 8.6* 8.7* 8.9 8.6*  MG 1.7 1.9 1.9 1.8 1.9  PHOS 3.6 5.1* 4.1 3.0 4.0   GFR: Estimated Creatinine Clearance: 156.1 mL/min (by C-G formula based on SCr of 0.69 mg/dL). Liver Function Tests: Recent Labs  Lab 06/15/20 1758  AST 28  ALT 27  ALKPHOS 70  BILITOT 1.0  PROT 6.2*  ALBUMIN 2.7*   No results for input(s): LIPASE, AMYLASE in the  last 168 hours. No results for input(s): AMMONIA in the last 168 hours. Coagulation Profile: Recent Labs  Lab 06/15/20 1758  INR 1.4*   Cardiac Enzymes: No results for input(s): CKTOTAL, CKMB, CKMBINDEX, TROPONINI in the last 168 hours. BNP (last 3 results) No results for input(s): PROBNP in the last 8760 hours. HbA1C: No results for input(s): HGBA1C in the last 72 hours. CBG: No results for input(s): GLUCAP in the last 168 hours. Lipid Profile: No results for input(s): CHOL, HDL, LDLCALC, TRIG, CHOLHDL, LDLDIRECT in the last 72 hours. Thyroid Function Tests: No results for input(s): TSH, T4TOTAL, FREET4, T3FREE, THYROIDAB in the last 72 hours. Anemia Panel: No results for  input(s): VITAMINB12, FOLATE, FERRITIN, TIBC, IRON, RETICCTPCT in the last 72 hours. Urine analysis:    Component Value Date/Time   COLORURINE AMBER (A) 06/15/2020 1620   APPEARANCEUR HAZY (A) 06/15/2020 1620   APPEARANCEUR Hazy (A) 12/25/2018 1442   LABSPEC 1.021 06/15/2020 1620   PHURINE 8.0 06/15/2020 1620   GLUCOSEU NEGATIVE 06/15/2020 1620   HGBUR NEGATIVE 06/15/2020 1620   BILIRUBINUR NEGATIVE 06/15/2020 1620   BILIRUBINUR Negative 12/25/2018 1442   KETONESUR NEGATIVE 06/15/2020 1620   PROTEINUR NEGATIVE 06/15/2020 1620   NITRITE NEGATIVE 06/15/2020 1620   LEUKOCYTESUR NEGATIVE 06/15/2020 1620     Chester Romero M.D. [facility] 06/21/2020, 1:43 PM  Available via Epic secure chat 7am-7pm After 7 pm, please refer to night coverage provider listed on amion.

## 2020-06-21 NOTE — Progress Notes (Signed)
PT Cancellation Note  Patient Details Name: Micah Barnier MRN: [id number] DOB: [birth year]   Cancelled Treatment:     PT attempt. Pt still awaiting MRI on shoulder. Pt unwilling to try PT until after MRI. " My R ankle continues to have gout." Pt did state he was able to get on on/off BSC previous date. PT will continue to follow per current POC. Will return after MRI to try to motivate pt for OOB activity.    [name] 06/21/2020, 9:29 AM

## 2020-06-22 DIAGNOSIS — I4891 Unspecified atrial fibrillation: Secondary | ICD-10-CM | POA: Diagnosis not present

## 2020-06-22 DIAGNOSIS — F10230 Alcohol dependence with withdrawal, uncomplicated: Secondary | ICD-10-CM | POA: Diagnosis not present

## 2020-06-22 LAB — PHOSPHORUS: Phosphorus: 4 mg/dL (ref 2.5–4.6)

## 2020-06-22 LAB — MAGNESIUM: Magnesium: 2 mg/dL (ref 1.7–2.4)

## 2020-06-22 MED ORDER — LIDOCAINE HCL 1 % IJ SOLN
10.0000 mL | Freq: Once | INTRAMUSCULAR | Status: AC
  Administered 2020-06-22: 10 mL via INTRADERMAL
  Filled 2020-06-22: qty 10

## 2020-06-22 MED ORDER — TRIAMCINOLONE ACETONIDE 40 MG/ML IJ SUSP
40.0000 mg | Freq: Once | INTRAMUSCULAR | Status: DC
  Filled 2020-06-22: qty 1

## 2020-06-22 MED ORDER — TRAZODONE HCL 50 MG PO TABS
150.0000 mg | ORAL_TABLET | Freq: Every evening | ORAL | Status: DC | PRN
  Administered 2020-06-22 – 2020-06-26 (×3): 150 mg via ORAL
  Filled 2020-06-22 (×5): qty 3

## 2020-06-22 MED ORDER — TRIAMCINOLONE ACETONIDE 40 MG/ML IJ SUSP
40.0000 mg | Freq: Once | INTRAMUSCULAR | Status: AC
  Administered 2020-06-22: 40 mg via INTRAMUSCULAR
  Filled 2020-06-22: qty 1

## 2020-06-22 MED ORDER — BENZONATATE 100 MG PO CAPS
100.0000 mg | ORAL_CAPSULE | Freq: Three times a day (TID) | ORAL | Status: DC
  Administered 2020-06-22 – 2020-06-29 (×21): 100 mg via ORAL
  Filled 2020-06-22 (×23): qty 1

## 2020-06-22 MED ORDER — GUAIFENESIN-DM 100-10 MG/5ML PO SYRP
5.0000 mL | ORAL_SOLUTION | ORAL | Status: DC | PRN
  Administered 2020-06-23 – 2020-06-24 (×3): 5 mL via ORAL
  Filled 2020-06-22 (×3): qty 5

## 2020-06-22 NOTE — Progress Notes (Signed)
Pt self initiates Bipap when he is ready for sleep 

## 2020-06-22 NOTE — Progress Notes (Signed)
[facility]                                                                              Patient Demographics  [name] [name], is a 60 y.o. male, DOB - 11/15/1960, SVX:[id number]  Admit date - 06/08/2020   Admitting Physician Amy August Luz, DO  Outpatient Primary MD for the patient is [name], DO  Outpatient specialists:   LOS - 14  days   Medical records reviewed and are as summarized below:    Chief Complaint  Patient presents with  . 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 fib, OSA, history of DVT, GERD, hypothyroid, hyperlipidemia, history of PE, chronic bilateral lower extremity venous stasis, presented with a fall.  Denied any loss of consciousness or hitting his head.  Reported abdominal pain.He told EMS that he did not want to go to the hospital and that he just wants to be left alone to die. On admission, reported nausea, baseline shortness of breath, alcohol use 4 to 5 hours prior to presentation.  Denied any SI and HI.[name] Presentation consistent with acute on chronic respiratory failure likely secondary to COPD/CHF.  Remains stepdown status.  Was on BiPAP since admission, taken off on 4/25, now tolerating O2 via [place].  Patient has been seen by palliative medicine.  DNR/DNI, would like to treat the treatable.    Assessment & Plan   Acute on chronic respiratory failure with hypoxia with underlying COPD, OSA, OHS - Intermittently requiring BiPAP -Continue O2, BiPAP as needed.  Outpatient on 2 to 3 L chronically. -2D echo 04/07/2020 had shown EF of 30 to 23%, diastolic parameters normal -O2 sats improving, back to baseline, 98% on 3 L, weaned O2 down from 5 to 3 L    Acute on chronic systolic CHF  exacerbation -2D echo 04/07/2020 had shown EF of 30 to 30%, diastolic parameters normal -Continues to diurese well, negative balance of 8.1 L. -Continue Lasix 60 mg daily, beta-blocker.  Acute gout, possible cellulitis, atelectasis -On 5/1 and 5/2, patient had mild fever with bilateral feet and wrist pain, states consistent with his gout flares,  -Blood cultures negative to date, some erythema and tenderness of left lower extremity, has venous stasis dermatitis.  Discontinue Rocephin -ESR 70, uric acid 6.5, CRP 10.2.  -Continue colchicine, prednisone, allopurinol.  Continue allopurinol -DC prednisone in a.m., day #7  Right shoulder pain, chronic secondary to rotator cuff tear - Right Shoulder x-ray on 3/6 was negative for any dislocation or fracture -MRI of the right shoulder showed full-thickness rotator cuff tear, orthopedics consulted -Continue pain control, on Robaxin, oxycodone 10 mg every 6 hours as needed -S/p cortisone injection in right shoulder today -Not an operative candidate here for right rotator cuff tear  Aerococcus bacteriuria -Typically a contaminant but pt endorses dysuria. Dr Si Raider discussed w/ pharm, nitrofurantoin typically covers - Received 5 days of Macrobid  Atrial fibrillation with RVR -Likely due  to acute alcohol withdrawal, has been weaned off of Cardizem drip -Heart rate controlled, continue metoprolol -Continue eliquis  Alcohol abuse -Drinks half a gallon of whiskey daily, no alcohol withdrawals currently Continue thiamine, Ativan as needed  Right sixth rib fracture Likely due to fall, acute to subacute -Continue pain control  Essential hypertension Stable, continue beta-blocker  Hypokalemia, hyponatremia -Resolved  Macrocytosis -Likely due to alcohol use, B12 folate normal  Depression Continue duloxetine  Hypothyroidism TSH 11.4, continue levothyroxine Compliance unclear, will need repeat TSH in 4 weeks.  OSA, OHS, morbid  obesity Continue BiPAP qhs  Failure to thrive -APS involved and pursuing guardianship.  APS wants patient to remain here for the time being.  Unable to safely care for himself at home.  Will need skilled nursing care, high risk for readmissions.  Pressure injury -Right thigh, left posterior thigh stage II, present on admission -stage 2 sacrum, POA     Morbid obesity  Estimated body mass index is 60.06 kg/m as calculated from the following:   Height as of this encounter: 5' 8"  (1.727 m).   Weight as of this encounter: 179.2 kg.  Code Status: dnr DVT Prophylaxis:   apixaban (ELIQUIS) tablet 5 mg   Level of Care: Level of care: Progressive Cardiac Family Communication: Discussed all imaging results, lab results, explained to the patient   Disposition Plan:     Status is: Inpatient  Remains inpatient appropriate because:Inpatient level of care appropriate due to severity of illness   Dispo: The patient is from: Home              Anticipated d/c is to: SNF              Patient currently is medically stable to d/c.  Awaiting skilled nursing facility bed    Difficult to place patient Yes      Time Spent in minutes   25 minutes  Procedures:  None   Consultants:   Palliative medicine Orthopedics  Antimicrobials:   Anti-infectives (From admission, onward)   Start     Dose/Rate Route Frequency Ordered Stop   06/16/20 1000  nitrofurantoin (macrocrystal-monohydrate) (MACROBID) capsule 100 mg        100 mg Oral Every 12 hours 06/16/20 0843 06/16/20 2209   06/16/20 1000  cefTRIAXone (ROCEPHIN) 2 g in sodium chloride 0.9 % 100 mL IVPB        2 g 200 mL/hr over 30 Minutes Intravenous Every 24 hours 06/16/20 0843 06/20/20 1204   06/11/20 1200  nitrofurantoin (macrocrystal-monohydrate) (MACROBID) capsule 100 mg  Status:  Discontinued        100 mg Oral Every 12 hours 06/11/20 1026 06/16/20 0843   06/09/20 0300  vancomycin (VANCOREADY) IVPB 1750 mg/350 mL  Status:   Discontinued        1,750 mg 175 mL/hr over 120 Minutes Intravenous Every 12 hours 06/08/20 1610 06/09/20 0800   06/08/20 2200  metroNIDAZOLE (FLAGYL) IVPB 500 mg  Status:  Discontinued       Note to Pharmacy: He got one dose in the ED already. Please schedule appropriately.   500 mg 100 mL/hr over 60 Minutes Intravenous Every 8 hours 06/08/20 1436 06/09/20 0800   06/08/20 2000  ceFEPIme (MAXIPIME) 2 g in sodium chloride 0.9 % 100 mL IVPB  Status:  Discontinued        2 g 200 mL/hr over 30 Minutes Intravenous Every 8 hours 06/08/20 1447 06/09/20 0800   06/08/20 1445  metroNIDAZOLE (FLAGYL) IVPB 500  mg  Status:  Discontinued       Note to Pharmacy: He got one dose in the ED already. Please schedule appropriately.   500 mg 100 mL/hr over 60 Minutes Intravenous Every 8 hours 06/08/20 1434 06/08/20 1436   06/08/20 1445  vancomycin (VANCOCIN) IVPB 1000 mg/200 mL premix       "Followed by" Linked Group Details   1,000 mg 200 mL/hr over 60 Minutes Intravenous  Once 06/08/20 1441 06/08/20 1633   06/08/20 1445  vancomycin (VANCOREADY) IVPB 1500 mg/300 mL  Status:  Discontinued       "Followed by" Linked Group Details   1,500 mg 150 mL/hr over 120 Minutes Intravenous  Once 06/08/20 1441 06/09/20 0717   06/08/20 1100  ceFEPIme (MAXIPIME) 2 g in sodium chloride 0.9 % 100 mL IVPB        2 g 200 mL/hr over 30 Minutes Intravenous  Once 06/08/20 1047 06/08/20 1331   06/08/20 1100  metroNIDAZOLE (FLAGYL) IVPB 500 mg        500 mg 100 mL/hr over 60 Minutes Intravenous  Once 06/08/20 1047 06/08/20 1529   06/08/20 1100  vancomycin (VANCOCIN) IVPB 1000 mg/200 mL premix  Status:  Discontinued        1,000 mg 200 mL/hr over 60 Minutes Intravenous  Once 06/08/20 1047 06/08/20 1441         Medications  Scheduled Meds: . allopurinol  100 mg Oral Daily  . apixaban  5 mg Oral BID  . vitamin C  500 mg Oral BID  . aspirin  81 mg Oral Daily  . atorvastatin  40 mg Oral Daily  . benzonatate  100 mg Oral  TID  . Chlorhexidine Gluconate Cloth  6 each Topical Daily  . colchicine  0.6 mg Oral Daily  . DULoxetine  20 mg Oral Daily  . furosemide  60 mg Oral Daily  . gabapentin  300 mg Oral TID  . levothyroxine  75 mcg Oral QAC breakfast  . lisinopril  2.5 mg Oral Daily  . methocarbamol  750 mg Oral TID  . metoprolol tartrate  100 mg Oral BID  . montelukast  10 mg Oral QHS  . multivitamin with minerals  1 tablet Oral Daily  . polyethylene glycol  17 g Oral Daily  . predniSONE  40 mg Oral Q breakfast  . Ensure Max Protein  11 oz Oral TID WC  . thiamine  100 mg Oral Daily   Or  . thiamine  100 mg Intravenous Daily   Continuous Infusions:  PRN Meds:.acetaminophen **OR** acetaminophen, albuterol, alum & mag hydroxide-simeth, guaiFENesin-dextromethorphan, HYDROmorphone (DILAUDID) injection, LORazepam, meclizine, metoprolol tartrate, midodrine, nitroGLYCERIN, ondansetron **OR** ondansetron (ZOFRAN) IV, oxyCODONE, traZODone      Subjective:   [name] [name] was seen and examined today.  Complains of intermittent cough and continues to have right shoulder pain. No abdominal pain nausea or vomiting, diarrhea.  No fevers  Objective:   Vitals:   06/21/20 1626 06/21/20 1953 06/22/20 0408 06/22/20 0500  BP: (!) 129/58 138/64 (!) 125/48   Pulse: 65 70 66   Resp: 18 18 18    Temp: 98.8 F (37.1 C) 97.6 F (36.4 C) 98.2 F (36.8 C)   TempSrc: Oral Oral Oral   SpO2: 95% 98% 98%   Weight:    (!) 179.2 kg  Height:        Intake/Output Summary (Last 24 hours) at 06/22/2020 1130 Last data filed at 06/22/2020 0500 Gross per 24 hour  Intake 2160  ml  Output 3050 ml  Net -890 ml     Wt Readings from Last 3 Encounters:  06/22/20 (!) 179.2 kg  05/16/20 (!) 188.1 kg  04/30/20 (!) 186 kg   Physical Exam  General: Alert and oriented x 3, NAD  Cardiovascular: S1 S2 clear, RRR.   Respiratory: CTAB, no wheezing, rales or rhonchi  Gastrointestinal: Soft, nontender, nondistended, NBS  Ext:  bilateral lower extremities wrapped  Neuro: no new deficits  Musculoskeletal: ROM decreased right shoulder  Skin: Lower extremities wrapped, stage I sacral decub  Psych: Normal affect and demeanor, alert and oriented x3     Data Reviewed:  I have personally reviewed following labs and imaging studies  Micro Results Recent Results (from the past 240 hour(s))  CULTURE, BLOOD (ROUTINE X 2) w Reflex to ID Panel     Status: None   Collection Time: 06/15/20  5:59 PM   Specimen: BLOOD  Result Value Ref Range Status   Specimen Description BLOOD BLOOD LEFT HAND  Final   Special Requests   Final    BOTTLES DRAWN AEROBIC AND ANAEROBIC Blood Culture results may not be optimal due to an inadequate volume of blood received in culture bottles   Culture   Final    NO GROWTH 5 DAYS Performed at [facility], [address] [place], [place] [address]    Report Status 06/20/2020 FINAL  Final  CULTURE, BLOOD (ROUTINE X 2) w Reflex to ID Panel     Status: None   Collection Time: 06/15/20  6:01 PM   Specimen: BLOOD  Result Value Ref Range Status   Specimen Description BLOOD BLOOD RIGHT HAND  Final   Special Requests   Final    BOTTLES DRAWN AEROBIC ONLY Blood Culture results may not be optimal due to an inadequate volume of blood received in culture bottles   Culture   Final    NO GROWTH 5 DAYS Performed at [facility], [address] [place], [place] [address]    Report Status 06/20/2020 FINAL  Final    Radiology Reports MR SHOULDER RIGHT WO CONTRAST  Result Date: 06/21/2020 CLINICAL DATA:  Chronic right shoulder pain with limited range of motion. No acute injury or prior relevant surgery. EXAM: MRI OF THE RIGHT SHOULDER WITHOUT CONTRAST TECHNIQUE: Multiplanar, multisequence MR imaging of the shoulder was performed. No intravenous contrast was administered. COMPARISON:  Radiographs 04/20/2020 FINDINGS: Despite efforts by the technologist and patient, moderate motion  artifact is present on today's exam and could not be eliminated. This reduces exam sensitivity and specificity. Patient was unable to complete the study. No sagittal T1 weighted images were obtained. Rotator cuff: There is a large full-thickness rotator cuff tear with full-thickness retracted insertional tears of the supraspinatus and infraspinatus tendons. Both tendons are retracted nearly to the level of the glenoid. The subscapularis and teres minor tendons appear intact. Muscles: There is edema within and surrounding the infraspinatus muscle. There is mild atrophy of the supraspinatus and infraspinatus muscles. Biceps long head:  Intact and normally positioned. Acromioclavicular Joint: The acromion is type 1. There are mild acromioclavicular degenerative changes. A moderate amount of fluid in the subacromial-subdeltoid bursa communicates with the shoulder joint via the full-thickness rotator cuff tear. Glenohumeral Joint: Small shoulder joint effusion and moderate glenohumeral degenerative changes. The subacromial space appears narrowed. Labrum: Labral assessment is limited by the motion. The posteroinferior labrum appears degenerated without discrete tear or paralabral cyst. Bones: No acute or significant extra-articular osseous findings. Other:  Soft tissue edema extends posterior to the shoulder without focal fluid collection. IMPRESSION: 1. Large full-thickness rotator cuff tear as described moderate traction of the supraspinatus and infraspinatus tendons. Mild muscular atrophy and moderate edema within and surrounding the infraspinatus muscle. 2. Moderate glenohumeral degenerative changes with posteroinferior labral degeneration. 3. The biceps tendon appears intact. Electronically Signed   By: Richardean Sale M.D.   On: 06/21/2020 12:44   CT CHEST ABDOMEN PELVIS W CONTRAST  Result Date: 06/16/2020 CLINICAL DATA:  Fever of unknown origin EXAM: CT CHEST, ABDOMEN, AND PELVIS WITH CONTRAST TECHNIQUE:  Multidetector CT imaging of the chest, abdomen and pelvis was performed following the standard protocol during bolus administration of intravenous contrast. CONTRAST:  186m OMNIPAQUE IOHEXOL 300 MG/ML  SOLN COMPARISON:  06/08/2020 FINDINGS: CT CHEST FINDINGS Cardiovascular: Thoracic aorta and its branches demonstrate mild atherosclerotic calcification. No aneurysmal dilatation or dissection is seen. Heart is mildly enlarged but stable. Coronary calcifications are noted. Although not timed for embolus evaluation common no large central pulmonary embolus is seen. Mediastinum/Nodes: Thoracic inlet is within normal limits. Mild mediastinal lipomatosis is noted. No sizable hilar or mediastinal adenopathy is noted. The esophagus is within normal limits. Lungs/Pleura: Scattered atelectatic changes are noted throughout both lungs without focal confluent infiltrate. These changes have worsened in the interval from the prior exam. No sizable parenchymal nodule is seen. No sizable pleural effusion is noted. Musculoskeletal: Degenerative change of the thoracic spine is noted. Healing right anterior sixth rib fracture is noted. CT ABDOMEN PELVIS FINDINGS Hepatobiliary: Mild fatty infiltration of the liver is noted. Gallbladder is within normal limits. Pancreas: Unremarkable. No pancreatic ductal dilatation or surrounding inflammatory changes. Spleen: Normal in size without focal abnormality. Adrenals/Urinary Tract: Adrenal glands are within normal limits. Kidneys demonstrate a normal enhancement pattern bilaterally. A few scattered hypodensities are noted most consistent with renal cysts. No obstructive changes are seen. No renal calculi are noted. The bladder is decompressed. Stomach/Bowel: Scattered diverticular changes noted within the colon. No evidence of diverticulitis is seen. No obstructive changes of the colon are noted. The appendix is within normal limits. The small bowel and stomach are unremarkable.  Vascular/Lymphatic: Aortic atherosclerosis. No enlarged abdominal or pelvic lymph nodes. Reproductive: Prostate is unremarkable. Other: No abdominal wall hernia or abnormality. No abdominopelvic ascites. Musculoskeletal: Degenerative changes of lumbar spine and right hip joint are noted. No compression deformities are seen. IMPRESSION: CT of the chest: New scattered atelectatic changes bilaterally. This may contribute to the patient's underlying fever. No associated effusion is seen. No other acute abnormality is noted in the chest. CT of the abdomen and pelvis: No acute abnormality is noted. Diverticular change without evidence of diverticulitis is noted. Scattered renal cysts are seen. Aortic Atherosclerosis (ICD10-I70.0). Electronically Signed   By: MInez CatalinaM.D.   On: 06/16/2020 15:12   CT CHEST ABDOMEN PELVIS W CONTRAST  Result Date: 06/08/2020 CLINICAL DATA:  Fall this morning with abdominal pain. EXAM: CT CHEST, ABDOMEN, AND PELVIS WITH CONTRAST TECHNIQUE: Multidetector CT imaging of the chest, abdomen and pelvis was performed following the standard protocol during bolus administration of intravenous contrast. CONTRAST:  1025mOMNIPAQUE IOHEXOL 300 MG/ML  SOLN COMPARISON:  Same day chest radiograph, chest CT dated 04/06/2020, abdominal ultrasound dated 03/10/2019. FINDINGS: CT CHEST FINDINGS Cardiovascular: Vascular calcifications are seen in the coronary arteries and aortic arch. The heart is mildly enlarged. No pericardial effusion. Mediastinum/Nodes: No enlarged mediastinal, hilar, or axillary lymph nodes. Thyroid gland, trachea, and esophagus demonstrate no significant findings. Lungs/Pleura: There is  a mild left lower lobe atelectasis. The right lung is clear. There is no pleural effusion or pneumothorax. Musculoskeletal: There is a subacute to chronic fracture of the anterior right sixth rib. This appears new since 04/06/2020. Degenerative changes are seen in the spine, most significant at T8-9.  CT ABDOMEN PELVIS FINDINGS Hepatobiliary: The liver is hypoattenuating, consistent with hepatic steatosis. No focal liver abnormality is seen. No gallstones, gallbladder wall thickening, or biliary dilatation. Pancreas: Unremarkable. No pancreatic ductal dilatation or surrounding inflammatory changes. Spleen: Normal in size without focal abnormality. Adrenals/Urinary Tract: Adrenal glands are unremarkable. Kidneys are normal, without renal calculi, focal lesion, or hydronephrosis. Bladder is unremarkable. Stomach/Bowel: Stomach is within normal limits. Appendix appears normal. There is colonic diverticulosis without evidence of diverticulitis. No evidence of bowel wall thickening, distention, or inflammatory changes. Vascular/Lymphatic: Aortic atherosclerosis. No enlarged abdominal or pelvic lymph nodes. Reproductive: Prostate is unremarkable. Other: No abdominal wall hernia or abnormality. No abdominopelvic ascites. Musculoskeletal: Degenerative changes are seen in the spine, most significant at L1-2 and L3-4. Severe degenerative changes are seen in the right hip. IMPRESSION: 1. Subacute to chronic fracture of the anterior right sixth rib. 2. Hepatic steatosis. Aortic Atherosclerosis (ICD10-I70.0). Electronically Signed   By: Zerita Boers M.D.   On: 06/08/2020 11:55   DG Chest Port 1 View  Result Date: 06/15/2020 CLINICAL DATA:  Fever, history of COPD, asthma EXAM: PORTABLE CHEST 1 VIEW COMPARISON:  06/08/2020 FINDINGS: Single frontal view of the chest demonstrates stable enlargement of the cardiac silhouette. Chronic central vascular congestion without airspace disease, effusion, or pneumothorax. No acute bony abnormalities. IMPRESSION: 1. Stable exam, no acute process. Electronically Signed   By: Randa Ngo M.D.   On: 06/15/2020 16:58   DG Chest Port 1 View  Result Date: 06/08/2020 CLINICAL DATA:  Questionable sepsis EXAM: PORTABLE CHEST 1 VIEW COMPARISON:  05/04/2020 FINDINGS: Cardiomegaly and  vascular congestion, chronic. No Kerley lines. No air bronchogram. Hazy right apical pleural thickening which is stable. IMPRESSION: Cardiomegaly and vascular congestion. Electronically Signed   By: Monte Fantasia M.D.   On: 06/08/2020 09:54    Lab Data:  CBC: Recent Labs  Lab 06/15/20 1758 06/16/20 0559 06/17/20 0507 06/18/20 6979 06/19/20 0349 06/20/20 0733 06/21/20 0651  WBC 6.4   < > 6.6 6.8 4.0 4.6 5.7  NEUTROABS 4.2  --   --   --   --   --   --   HGB 11.3*   < > 11.1* 10.5* 10.1* 11.1* 11.3*  HCT 36.2*   < > 33.2* 31.8* 31.4* 34.9* 36.0*  MCV 100.6*   < > 96.2 96.4 97.8 99.4 101.1*  PLT 198   < > 199 258 243 319 297   < > = values in this interval not displayed.   Basic Metabolic Panel: Recent Labs  Lab 06/17/20 0507 06/18/20 0338 06/19/20 0349 06/20/20 0733 06/21/20 0651 06/22/20 0502  NA 133* 131* 134* 136 136  --   K 3.1* 4.2 4.3 4.0 4.4  --   CL 78* 80* 84* 85* 87*  --   CO2 42* 40* 39* 43* 40*  --   GLUCOSE 101* 135* 139* 169* 98  --   BUN 16 33* 36* 26* 25*  --   CREATININE 0.50* 0.69 0.65 0.67 0.69  --   CALCIUM 8.6* 8.6* 8.7* 8.9 8.6*  --   MG 1.7 1.9 1.9 1.8 1.9 2.0  PHOS 3.6 5.1* 4.1 3.0 4.0 4.0   GFR: Estimated Creatinine Clearance: 158.5 mL/min (  by C-G formula based on SCr of 0.69 mg/dL). Liver Function Tests: Recent Labs  Lab 06/15/20 1758  AST 28  ALT 27  ALKPHOS 70  BILITOT 1.0  PROT 6.2*  ALBUMIN 2.7*   No results for input(s): LIPASE, AMYLASE in the last 168 hours. No results for input(s): AMMONIA in the last 168 hours. Coagulation Profile: Recent Labs  Lab 06/15/20 1758  INR 1.4*   Cardiac Enzymes: No results for input(s): CKTOTAL, CKMB, CKMBINDEX, TROPONINI in the last 168 hours. BNP (last 3 results) No results for input(s): PROBNP in the last 8760 hours. HbA1C: No results for input(s): HGBA1C in the last 72 hours. CBG: No results for input(s): GLUCAP in the last 168 hours. Lipid Profile: No results for input(s): CHOL,  HDL, LDLCALC, TRIG, CHOLHDL, LDLDIRECT in the last 72 hours. Thyroid Function Tests: No results for input(s): TSH, T4TOTAL, FREET4, T3FREE, THYROIDAB in the last 72 hours. Anemia Panel: No results for input(s): VITAMINB12, FOLATE, FERRITIN, TIBC, IRON, RETICCTPCT in the last 72 hours. Urine analysis:    Component Value Date/Time   COLORURINE AMBER (A) 06/15/2020 1620   APPEARANCEUR HAZY (A) 06/15/2020 1620   APPEARANCEUR Hazy (A) 12/25/2018 1442   LABSPEC 1.021 06/15/2020 1620   PHURINE 8.0 06/15/2020 1620   GLUCOSEU NEGATIVE 06/15/2020 1620   HGBUR NEGATIVE 06/15/2020 1620   BILIRUBINUR NEGATIVE 06/15/2020 1620   BILIRUBINUR Negative 12/25/2018 1442   KETONESUR NEGATIVE 06/15/2020 1620   PROTEINUR NEGATIVE 06/15/2020 1620   NITRITE NEGATIVE 06/15/2020 1620   LEUKOCYTESUR NEGATIVE 06/15/2020 1620     Deny Chevez M.D. [facility] 06/22/2020, 11:30 AM  Available via Epic secure chat 7am-7pm After 7 pm, please refer to night coverage provider listed on amion.

## 2020-06-22 NOTE — Consult Note (Signed)
ORTHOPAEDIC CONSULTATION  REQUESTING PHYSICIAN: Cathren Harsh, MD  Chief Complaint: Right shoulder pain on a chronic basis   HPI: [name] [name] is a 60 y.o. male who complains of right shoulder pain.  He has had pain on and off for years.  He injured the shoulder about 10 years ago.  He was admitted for falls and multiple medical issues.  He is on chronic Coumadin.  MRI has shown a complete rotator cuff tear with retraction.  There is some atrophy of the supraspinatus muscle.  He is currently not septic.  I recommended a cortisone injection and he is agreeable to this.  Risks and benefits were discussed with him.  He has had these before.  Past Medical History:  Diagnosis Date  . Allergy   . Anxiety   . Arthritis   . Asthma   . Brain damage   . Chronic pain of both knees 07/13/2018  . Clotting disorder (HCC)   . COPD (chronic obstructive pulmonary disease) (HCC)   . Depression   . GERD (gastroesophageal reflux disease)   . HFrEF (heart failure with reduced ejection fraction) (HCC)    a. 03/2018 Echo: EF 25-30%, diff HK. Mod LAE.  [name] History of DVT (deep vein thrombosis)   . History of pulmonary embolism    a. Chronic coumadin.  [name] Hypertension   . MI (myocardial infarction) (HCC)   . Morbid obesity (HCC)   . Neck pain 07/13/2018  . NICM (nonischemic cardiomyopathy) (HCC)    a. s/p Cath x 3 - reportedly nl cors. Last cath 2019 in [place]; b. a. 03/2018 Echo: EF 25-30%, diff HK.  [name] Persistent atrial fibrillation (HCC)    a. 03/2018 s/p DCCV; b. CHA2DS2VASc = 1-->Xarelto (later changed to warfarin); c. 05/2018 recurrent afib-->Amio initiated.  . Sleep apnea   . Sleep apnea    Past Surgical History:  Procedure Laterality Date  . CARDIOVERSION N/A 03/24/2018   Procedure: CARDIOVERSION;  Surgeon: Antonieta Iba, MD;  Location: [facility] ORS;  Service: Cardiovascular;  Laterality: N/A;  . CARDIOVERSION N/A 08/08/2018   Procedure: CARDIOVERSION;  Surgeon: [name], MD;  Location: [facility]  ORS;  Service: Cardiovascular;  Laterality: N/A;  . hearnia repair     X 3- total of two surgeries  . HERNIA REPAIR    . LEG SURGERY     Social History   Socioeconomic History  . Marital status: Single    Spouse name: Not on file  . Number of children: Not on file  . Years of education: Not on file  . Highest education level: Not on file  Occupational History  . Not on file  Tobacco Use  . Smoking status: Never Smoker  . Smokeless tobacco: Never Used  Vaping Use  . Vaping Use: Never used  Substance and Sexual Activity  . Alcohol use: Not Currently  . Drug use: Never  . Sexual activity: Not Currently  Other Topics Concern  . Not on file  Social History Narrative  . Not on file   Social Determinants of Health   Financial Resource Strain: Not on file  Food Insecurity: Not on file  Transportation Needs: Unmet Transportation Needs  . Lack of Transportation (Medical): Yes  . Lack of Transportation (Non-Medical): Yes  Physical Activity: Not on file  Stress: Not on file  Social Connections: Not on file   Family History  Problem Relation Age of Onset  . Heart failure Mother   . Lung cancer Mother   . Lung  cancer Father   . Heart attack Maternal Grandmother   . Heart attack Maternal Grandfather    No Known Allergies Prior to Admission medications   Medication Sig Start Date End Date Taking? Authorizing Provider  gabapentin (NEURONTIN) 300 MG capsule Take 300 mg by mouth 3 (three) times daily.   Yes [provider]  oxyCODONE-acetaminophen (PERCOCET) 7.5-325 MG tablet Take 1 tablet by mouth every 4 (four) hours as needed for severe pain.   Yes [provider]  warfarin (COUMADIN) 5 MG tablet Take 5 mg by mouth daily at 4 PM.   Yes [provider]  albuterol (VENTOLIN HFA) 108 (90 Base) MCG/ACT inhaler Inhale 2 puffs into the lungs every 4 (four) hours as needed for wheezing or shortness of breath. 04/28/20   Sreenath, Jonelle Sports, MD  apixaban  (ELIQUIS) 5 MG TABS tablet Take 1 tablet (5 mg total) by mouth 2 (two) times daily. 04/28/20   Tresa Moore, MD  aspirin 81 MG chewable tablet Chew 1 tablet (81 mg total) by mouth daily. Patient not taking: No sig reported 02/03/20   Esaw Grandchild A, DO  atorvastatin (LIPITOR) 40 MG tablet TAKE 1 TABLET BY MOUTH ONCE DAILY Patient taking differently: Take 40 mg by mouth daily. 08/15/19   Flinchum, [name], FNP  DULoxetine (CYMBALTA) 20 MG capsule Take 1 capsule (20 mg total) by mouth daily. 10/25/19   Karamalegos, Netta Neat, DO  levothyroxine (SYNTHROID) 75 MCG tablet Take 1 tablet (75 mcg total) by mouth daily before breakfast. 01/13/20   Enedina Finner, MD  lisinopril (ZESTRIL) 2.5 MG tablet Take by mouth. 03/06/19   [provider]  loperamide (IMODIUM) 2 MG capsule Take 1 capsule (2 mg total) by mouth as needed for diarrhea or loose stools. Patient not taking: No sig reported 06/26/19   Arnetha Courser, MD  magnesium oxide (MAG-OX) 400 (241.3 Mg) MG tablet Take 1 tablet (400 mg total) by mouth 2 (two) times daily. Patient not taking: No sig reported 01/12/20   Enedina Finner, MD  meclizine (ANTIVERT) 25 MG tablet Take 0.5-1 tablets (12.5-25 mg total) by mouth 3 (three) times daily as needed for dizziness. 02/26/20   Karamalegos, Netta Neat, DO  metoprolol succinate (TOPROL-XL) 100 MG 24 hr tablet Take 100 mg by mouth daily. 03/17/20   [provider]  montelukast (SINGULAIR) 10 MG tablet Take 1 tablet (10 mg total) by mouth at bedtime. 10/12/19   Karamalegos, Netta Neat, DO  Multiple Vitamin (MULTIVITAMIN WITH MINERALS) TABS tablet Take 1 tablet by mouth daily.    [provider]  naphazoline-glycerin (CLEAR EYES REDNESS) 0.012-0.2 % SOLN Place 1-2 drops into both eyes 4 (four) times daily as needed for eye irritation. 08/14/19   Alford Highland, MD  nitroGLYCERIN (NITROSTAT) 0.4 MG SL tablet Place 1 tablet under tongue every 5 minutes as needed for chest pain. (No more  than 3 doses within 15 minutes) 03/08/19   Flinchum, [name], FNP  potassium chloride SA (KLOR-CON) 20 MEQ tablet Take 1 tablet (20 mEq total) by mouth daily. 05/02/20   Danford, Earl Lites, MD  torsemide (DEMADEX) 20 MG tablet Take 4 tablets (80 mg total) by mouth 2 (two) times daily. Patient not taking: No sig reported 02/02/20   Esaw Grandchild A, DO  traZODone (DESYREL) 150 MG tablet Take 1 tablet (150 mg total) by mouth at bedtime. 10/25/19   [name], DO   MR SHOULDER RIGHT WO CONTRAST  Result Date: 06/21/2020 CLINICAL DATA:  Chronic  right shoulder pain with limited range of motion. No acute injury or prior relevant surgery. EXAM: MRI OF THE RIGHT SHOULDER WITHOUT CONTRAST TECHNIQUE: Multiplanar, multisequence MR imaging of the shoulder was performed. No intravenous contrast was administered. COMPARISON:  Radiographs 04/20/2020 FINDINGS: Despite efforts by the technologist and patient, moderate motion artifact is present on today's exam and could not be eliminated. This reduces exam sensitivity and specificity. Patient was unable to complete the study. No sagittal T1 weighted images were obtained. Rotator cuff: There is a large full-thickness rotator cuff tear with full-thickness retracted insertional tears of the supraspinatus and infraspinatus tendons. Both tendons are retracted nearly to the level of the glenoid. The subscapularis and teres minor tendons appear intact. Muscles: There is edema within and surrounding the infraspinatus muscle. There is mild atrophy of the supraspinatus and infraspinatus muscles. Biceps long head:  Intact and normally positioned. Acromioclavicular Joint: The acromion is type 1. There are mild acromioclavicular degenerative changes. A moderate amount of fluid in the subacromial-subdeltoid bursa communicates with the shoulder joint via the full-thickness rotator cuff tear. Glenohumeral Joint: Small shoulder joint effusion and moderate glenohumeral  degenerative changes. The subacromial space appears narrowed. Labrum: Labral assessment is limited by the motion. The posteroinferior labrum appears degenerated without discrete tear or paralabral cyst. Bones: No acute or significant extra-articular osseous findings. Other: Soft tissue edema extends posterior to the shoulder without focal fluid collection. IMPRESSION: 1. Large full-thickness rotator cuff tear as described moderate traction of the supraspinatus and infraspinatus tendons. Mild muscular atrophy and moderate edema within and surrounding the infraspinatus muscle. 2. Moderate glenohumeral degenerative changes with posteroinferior labral degeneration. 3. The biceps tendon appears intact. Electronically Signed   By: Carey Bullocks M.D.   On: 06/21/2020 12:44    Positive ROS: All other systems have been reviewed and were otherwise negative with the exception of those mentioned in the HPI and as above.  Physical Exam: General: Alert, no acute distress Cardiovascular: No pedal edema Respiratory: No cyanosis, no use of accessory musculature GI: No organomegaly, abdomen is soft and non-tender Skin: No lesions in the area of chief complaint Neurologic: Sensation intact distally Psychiatric: Patient is competent for consent with normal mood and affect Lymphatic: No axillary or cervical lymphadenopathy  MUSCULOSKELETAL: The right shoulder has pain with abduction and rotation.  Somewhat weakness.  There is no redness or swelling indicative of any infection.  Neurovascular status good distally.  Assessment: Complete right rotator cuff tear with retraction and atrophy  Plan: Right shoulder was prepped sterilely and injected with 10 cc of Xylocaine and 40 mg of Kenalog.  No ice this as needed.  He may follow-up in our office as needed.  He is not a good operative candidate for any surgery here.    Valinda Hoar, MD [phone]   06/22/2020 10:36 AM

## 2020-06-23 DIAGNOSIS — I4891 Unspecified atrial fibrillation: Secondary | ICD-10-CM | POA: Diagnosis not present

## 2020-06-23 DIAGNOSIS — F1010 Alcohol abuse, uncomplicated: Secondary | ICD-10-CM | POA: Diagnosis not present

## 2020-06-23 DIAGNOSIS — F10230 Alcohol dependence with withdrawal, uncomplicated: Secondary | ICD-10-CM | POA: Diagnosis not present

## 2020-06-23 DIAGNOSIS — I482 Chronic atrial fibrillation, unspecified: Secondary | ICD-10-CM | POA: Diagnosis not present

## 2020-06-23 LAB — BASIC METABOLIC PANEL
Anion gap: 9 (ref 5–15)
BUN: 29 mg/dL — ABNORMAL HIGH (ref 6–20)
CO2: 32 mmol/L (ref 22–32)
Calcium: 8.6 mg/dL — ABNORMAL LOW (ref 8.9–10.3)
Chloride: 92 mmol/L — ABNORMAL LOW (ref 98–111)
Creatinine, Ser: 0.7 mg/dL (ref 0.61–1.24)
GFR, Estimated: >60 mL/min (ref >60)
Glucose, Bld: 209 mg/dL — ABNORMAL HIGH (ref 70–99)
Potassium: 5.3 mmol/L — ABNORMAL HIGH (ref 3.5–5.1)
Sodium: 133 mmol/L — ABNORMAL LOW (ref 135–145)

## 2020-06-23 LAB — MAGNESIUM: Magnesium: 2.2 mg/dL (ref 1.7–2.4)

## 2020-06-23 LAB — PHOSPHORUS: Phosphorus: 4 mg/dL (ref 2.5–4.6)

## 2020-06-23 MED ORDER — HYDROMORPHONE HCL 1 MG/ML IJ SOLN
1.0000 mg | INTRAMUSCULAR | Status: DC | PRN
  Administered 2020-06-24 – 2020-06-28 (×4): 1 mg via INTRAVENOUS
  Filled 2020-06-23 (×4): qty 1

## 2020-06-23 MED ORDER — OXYCODONE HCL 5 MG PO TABS
10.0000 mg | ORAL_TABLET | ORAL | Status: DC | PRN
  Administered 2020-06-23 – 2020-06-28 (×19): 10 mg via ORAL
  Filled 2020-06-23 (×19): qty 2

## 2020-06-23 NOTE — TOC Progression Note (Signed)
Transition of Care [facility]) - Progression Note    Patient Details  Name: [name] [name] MRN: [id number] Date of Birth: 03-04-1960  [facility] CM/SW Contact  Chapman Fitch, RN Phone Number: 06/23/2020, 12:02 PM  Clinical Narrative:    Bed search extended Patient updated by phone    Expected Discharge Plan: Skilled Nursing Facility Barriers to Discharge: Continued Medical Work up  Expected Discharge Plan and Services Expected Discharge Plan: Skilled Nursing Facility     Post Acute Care Choice: Skilled Nursing Facility Living arrangements for the past 2 months: Apartment                   DME Agency: [facility] Date DME Agency Contacted: 06/12/20 Time DME Agency Contacted: [phone] Representative spoke with at DME Agency: Delila Spence reporesentative stated they woudl not continue services for the patient due to intoxication.             Social Determinants of Health (SDOH) Interventions    Readmission Risk Interventions Readmission Risk Prevention Plan 01/11/2020 12/24/2019 01/25/2019  Transportation Screening Complete Complete Complete  PCP or Specialist Appt within 3-5 Days - - -  HRI or Home Care Consult - - -  Social Work Consult for Recovery Care Planning/Counseling - - -  Palliative Care Screening - - -  Medication Review [employer]) Complete Complete Complete  PCP or Specialist appointment within 3-5 days of discharge Complete Complete Complete  HRI or Home Care Consult - - Complete  SW Recovery Care/Counseling Consult Complete Complete Complete  Palliative Care Screening Complete Not Applicable Not Applicable  Skilled Nursing Facility Not Applicable Not Applicable Not Applicable

## 2020-06-23 NOTE — Progress Notes (Signed)
Pt self-initiates bipap when ready for sleep

## 2020-06-23 NOTE — Progress Notes (Signed)
Nutrition Follow-up  DOCUMENTATION CODES:   Morbid obesity  INTERVENTION:   Ensure Max protein supplement TID, each supplement provides 150kcal and 30g of protein.  Magic cup TID with meals, each supplement provides 290 kcal and 9 grams of protein  MVI po daily   Vitamin C 500mg  po BID  NUTRITION DIAGNOSIS:   Increased nutrient needs related to wound healing as evidenced by increased estimated needs.  GOAL:   Patient will meet greater than or equal to 90% of their needs  -progressing   MONITOR:   PO intake,Supplement acceptance,Labs,Weight trends,Skin,I & O's  ASSESSMENT:   60 y.o. 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 Refeed labs stable. Per chart, pt is weight stable since admit. SNF placement pending.   Medications reviewed and include: allopurinol, vitamin C, aspirin, lasix, synthroid, MVI, miralax, thiamine  Labs reviewed: K 4.4 wnl, BUN 25(H)- 5/7 P 4.0 wnl, Mg 2.2 wnl  Diet Order:   Diet Order            Diet Carb Modified Fluid consistency: Thin; Room service appropriate? 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  Estimated Nutritional Needs:   Kcal:  3000-3300kcal/day  Protein:  140-150g/day  Fluid:  2.2-2.5L/day  MS, RD, LDN Please refer to [facility] for RD and/or RD on-call/weekend/after hours pager

## 2020-06-23 NOTE — Progress Notes (Signed)
Removed pt from BiPAP and placed on 3Lnc

## 2020-06-23 NOTE — Progress Notes (Signed)
[facility]                                                                              Patient Demographics  [name] [name], is a 60 y.o. male, DOB - [birth year], XKG:[id number]  Admit date - 06/08/2020   Admitting Physician Amy August Luz, DO  Outpatient Primary MD for the patient is [name], DO  Outpatient specialists:   LOS - 15  days   Medical records reviewed and are as summarized below:    Chief Complaint  Patient presents with  . 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 fib, OSA, history of DVT, GERD, hypothyroid, hyperlipidemia, history of PE, chronic bilateral lower extremity venous stasis, presented with a fall.  Denied any loss of consciousness or hitting his head.  Reported abdominal pain.He told EMS that he did not want to go to the hospital and that he just wants to be left alone to die. On admission, reported nausea, baseline shortness of breath, alcohol use 4 to 5 hours prior to presentation.  Denied any SI and HI.[name] Presentation consistent with acute on chronic respiratory failure likely secondary to COPD/CHF.  Remains stepdown status.  Was on BiPAP since admission, taken off on 4/25, now tolerating O2 via [place].  Patient has been seen by palliative medicine.  DNR/DNI, would like to treat the treatable.    Assessment & Plan   Acute on chronic respiratory failure with hypoxia with underlying COPD, OSA, OHS - Intermittently requiring BiPAP -Continue O2, BiPAP as needed.  Outpatient on 2 to 3 L chronically. -2D echo 04/07/2020 had shown EF of 30 to 70%, diastolic parameters normal -O2 sats improved, 95% on 3 L, at baseline   Acute on chronic systolic CHF exacerbation -2D echo 04/07/2020 had shown EF  of 30 to 26%, diastolic parameters normal -Currently on Lasix 60 mg daily, negative balance of 14 L, will check BMET -Outpatient takes torsemide 80 mg twice daily  Acute gout, possible cellulitis, chronic venous stasis bilateral -On 5/1 and 5/2, patient had mild fever with bilateral feet and wrist pain, states consistent with his gout flares,  -Blood cultures negative to date, some erythema and tenderness of left lower extremity, has venous stasis dermatitis.  Initially placed on IV Rocephin, has completed course. -ESR 70, uric acid 6.5, CRP 10.2.  -Continue colchicine, allopurinol.  Discontinued prednisone, received 7 days -Wound care following, both lower extremities wrapped   Right shoulder pain, chronic secondary to rotator cuff tear - Right Shoulder x-ray on 3/6 was negative for any dislocation or fracture -MRI of the right shoulder showed full-thickness rotator cuff tear, orthopedics consulted -S/p cortisone injection in right shoulder on 5/8 by Dr Sabra Heck  -Continues to complain of uncontrolled pain, states has right hip arthritis, rotator cuff right shoulder, gout and osteoarthritis. -Increase oxycodone to 10 mg every 4 hours as needed for moderate and breakthrough pain.  Continue IV Dilaudid 1 mg every  3 hours as needed for severe pain.  Aerococcus bacteriuria -Typically a contaminant but pt endorses dysuria. Dr Si Raider discussed w/ pharm, nitrofurantoin typically covers - Received 5 days of Macrobid  Atrial fibrillation with RVR -Likely due to acute alcohol withdrawal, has been weaned off of Cardizem drip -Heart rate controlled, continue metoprolol -Continue eliquis  Alcohol abuse -Drinks half a gallon of whiskey daily, no alcohol withdrawals currently Continue thiamine, Ativan as needed  Right sixth rib fracture Likely due to fall, acute to subacute -Continue pain control  Essential hypertension Stable, continue beta-blocker, Lasix  Hypokalemia, hyponatremia -Follow  BMET  Macrocytosis -Likely due to alcohol use, B12 folate normal  Depression Continue duloxetine  Hypothyroidism TSH 11.4, continue levothyroxine Compliance unclear, will need repeat TSH in 4 weeks.  OSA, OHS, morbid obesity Continue BiPAP qhs  Failure to thrive -APS involved and pursuing guardianship.  APS wants patient to remain here for the time being.  Unable to safely care for himself at home.  Will need skilled nursing care, high risk for readmissions.  Pressure injury -Right thigh, left posterior thigh stage II, present on admission -stage 2 sacrum, POA     Morbid obesity  Estimated body mass index is 59.85 kg/m as calculated from the following:   Height as of this encounter: 5' 8"  (1.727 m).   Weight as of this encounter: 178.5 kg.  Code Status: dnr DVT Prophylaxis:   apixaban (ELIQUIS) tablet 5 mg   Level of Care: Level of care: Med-Surg Family Communication: Discussed all imaging results, lab results, explained to the patient   Disposition Plan:     Status is: Inpatient  Remains inpatient appropriate because:Inpatient level of care appropriate due to severity of illness   Dispo: The patient is from: Home              Anticipated d/c is to: SNF              Patient currently is medically stable to d/c.  Awaiting skilled nursing facility bed    Difficult to place patient Yes      Time Spent in minutes   25 minutes  Procedures:  None   Consultants:   Palliative medicine Orthopedics  Antimicrobials:   Anti-infectives (From admission, onward)   Start     Dose/Rate Route Frequency Ordered Stop   06/16/20 1000  nitrofurantoin (macrocrystal-monohydrate) (MACROBID) capsule 100 mg        100 mg Oral Every 12 hours 06/16/20 0843 06/16/20 2209   06/16/20 1000  cefTRIAXone (ROCEPHIN) 2 g in sodium chloride 0.9 % 100 mL IVPB        2 g 200 mL/hr over 30 Minutes Intravenous Every 24 hours 06/16/20 0843 06/20/20 1204   06/11/20 1200  nitrofurantoin  (macrocrystal-monohydrate) (MACROBID) capsule 100 mg  Status:  Discontinued        100 mg Oral Every 12 hours 06/11/20 1026 06/16/20 0843   06/09/20 0300  vancomycin (VANCOREADY) IVPB 1750 mg/350 mL  Status:  Discontinued        1,750 mg 175 mL/hr over 120 Minutes Intravenous Every 12 hours 06/08/20 1610 06/09/20 0800   06/08/20 2200  metroNIDAZOLE (FLAGYL) IVPB 500 mg  Status:  Discontinued       Note to Pharmacy: He got one dose in the ED already. Please schedule appropriately.   500 mg 100 mL/hr over 60 Minutes Intravenous Every 8 hours 06/08/20 1436 06/09/20 0800   06/08/20 2000  ceFEPIme (MAXIPIME) 2 g in sodium chloride 0.9 %  100 mL IVPB  Status:  Discontinued        2 g 200 mL/hr over 30 Minutes Intravenous Every 8 hours 06/08/20 1447 06/09/20 0800   06/08/20 1445  metroNIDAZOLE (FLAGYL) IVPB 500 mg  Status:  Discontinued       Note to Pharmacy: He got one dose in the ED already. Please schedule appropriately.   500 mg 100 mL/hr over 60 Minutes Intravenous Every 8 hours 06/08/20 1434 06/08/20 1436   06/08/20 1445  vancomycin (VANCOCIN) IVPB 1000 mg/200 mL premix       "Followed by" Linked Group Details   1,000 mg 200 mL/hr over 60 Minutes Intravenous  Once 06/08/20 1441 06/08/20 1633   06/08/20 1445  vancomycin (VANCOREADY) IVPB 1500 mg/300 mL  Status:  Discontinued       "Followed by" Linked Group Details   1,500 mg 150 mL/hr over 120 Minutes Intravenous  Once 06/08/20 1441 06/09/20 0717   06/08/20 1100  ceFEPIme (MAXIPIME) 2 g in sodium chloride 0.9 % 100 mL IVPB        2 g 200 mL/hr over 30 Minutes Intravenous  Once 06/08/20 1047 06/08/20 1331   06/08/20 1100  metroNIDAZOLE (FLAGYL) IVPB 500 mg        500 mg 100 mL/hr over 60 Minutes Intravenous  Once 06/08/20 1047 06/08/20 1529   06/08/20 1100  vancomycin (VANCOCIN) IVPB 1000 mg/200 mL premix  Status:  Discontinued        1,000 mg 200 mL/hr over 60 Minutes Intravenous  Once 06/08/20 1047 06/08/20 1441          Medications  Scheduled Meds: . allopurinol  100 mg Oral Daily  . apixaban  5 mg Oral BID  . vitamin C  500 mg Oral BID  . aspirin  81 mg Oral Daily  . atorvastatin  40 mg Oral Daily  . benzonatate  100 mg Oral TID  . Chlorhexidine Gluconate Cloth  6 each Topical Daily  . colchicine  0.6 mg Oral Daily  . DULoxetine  20 mg Oral Daily  . furosemide  60 mg Oral Daily  . gabapentin  300 mg Oral TID  . levothyroxine  75 mcg Oral QAC breakfast  . lisinopril  2.5 mg Oral Daily  . methocarbamol  750 mg Oral TID  . metoprolol tartrate  100 mg Oral BID  . montelukast  10 mg Oral QHS  . multivitamin with minerals  1 tablet Oral Daily  . polyethylene glycol  17 g Oral Daily  . predniSONE  40 mg Oral Q breakfast  . Ensure Max Protein  11 oz Oral TID WC  . thiamine  100 mg Oral Daily   Or  . thiamine  100 mg Intravenous Daily   Continuous Infusions:  PRN Meds:.acetaminophen **OR** acetaminophen, albuterol, alum & mag hydroxide-simeth, guaiFENesin-dextromethorphan, HYDROmorphone (DILAUDID) injection, LORazepam, meclizine, metoprolol tartrate, midodrine, nitroGLYCERIN, ondansetron **OR** ondansetron (ZOFRAN) IV, oxyCODONE, traZODone      Subjective:   Isay Perleberg was seen and examined today.  Continues to complain of right hip arthritis, gout pain, osteoarthritis, right shoulder pain.  Otherwise no acute issues, had a BM yesterday.  No worsening of shortness of breath.  No chest pain, abdominal pain, nausea or vomiting  Objective:   Vitals:   06/23/20 0538 06/23/20 0544 06/23/20 0759 06/23/20 1200  BP:  (!) 110/59 105/80 120/78  Pulse:  (!) 56 75 88  Resp:  20 14 14   Temp:  98 F (36.7 C) 98 F (36.7  C) 98.2 F (36.8 C)  TempSrc:  Axillary Axillary Axillary  SpO2:  100% 100% 95%  Weight: (!) 178.5 kg     Height:        Intake/Output Summary (Last 24 hours) at 06/23/2020 1436 Last data filed at 06/23/2020 9373 Gross per 24 hour  Intake 480 ml  Output 2800 ml   Net -2320 ml     Wt Readings from Last 3 Encounters:  06/23/20 (!) 178.5 kg  05/16/20 (!) 188.1 kg  04/30/20 (!) 186 kg     Physical Exam  General: Alert and oriented x 3, NAD  Cardiovascular: S1 S2 clear, RRR. No pedal edema b/l  Respiratory: Fairly CTA  Gastrointestinal: Obese, soft, nontender, nondistended, NBS  Ext: both lower extremities wrapped  Neuro: no new deficits  Musculoskeletal: No cyanosis, clubbing  Skin: Lower extremities wrapped, stage I sacral decub  Psych: Normal affect and demeanor, alert and oriented x3    Data Reviewed:  I have personally reviewed following labs and imaging studies  Micro Results Recent Results (from the past 240 hour(s))  CULTURE, BLOOD (ROUTINE X 2) w Reflex to ID Panel     Status: None   Collection Time: 06/15/20  5:59 PM   Specimen: BLOOD  Result Value Ref Range Status   Specimen Description BLOOD BLOOD LEFT HAND  Final   Special Requests   Final    BOTTLES DRAWN AEROBIC AND ANAEROBIC Blood Culture results may not be optimal due to an inadequate volume of blood received in culture bottles   Culture   Final    NO GROWTH 5 DAYS Performed at [facility], [address] [place], [place] [address]    Report Status 06/20/2020 FINAL  Final  CULTURE, BLOOD (ROUTINE X 2) w Reflex to ID Panel     Status: None   Collection Time: 06/15/20  6:01 PM   Specimen: BLOOD  Result Value Ref Range Status   Specimen Description BLOOD BLOOD RIGHT HAND  Final   Special Requests   Final    BOTTLES DRAWN AEROBIC ONLY Blood Culture results may not be optimal due to an inadequate volume of blood received in culture bottles   Culture   Final    NO GROWTH 5 DAYS Performed at [facility], [address], [place], [place] [address]    Report Status 06/20/2020 FINAL  Final    Radiology Reports MR SHOULDER RIGHT WO CONTRAST  Result Date: 06/21/2020 CLINICAL DATA:  Chronic right shoulder pain with limited range of  motion. No acute injury or prior relevant surgery. EXAM: MRI OF THE RIGHT SHOULDER WITHOUT CONTRAST TECHNIQUE: Multiplanar, multisequence MR imaging of the shoulder was performed. No intravenous contrast was administered. COMPARISON:  Radiographs 04/20/2020 FINDINGS: Despite efforts by the technologist and patient, moderate motion artifact is present on today's exam and could not be eliminated. This reduces exam sensitivity and specificity. Patient was unable to complete the study. No sagittal T1 weighted images were obtained. Rotator cuff: There is a large full-thickness rotator cuff tear with full-thickness retracted insertional tears of the supraspinatus and infraspinatus tendons. Both tendons are retracted nearly to the level of the glenoid. The subscapularis and teres minor tendons appear intact. Muscles: There is edema within and surrounding the infraspinatus muscle. There is mild atrophy of the supraspinatus and infraspinatus muscles. Biceps long head:  Intact and normally positioned. Acromioclavicular Joint: The acromion is type 1. There are mild acromioclavicular degenerative changes. A moderate amount of fluid in the subacromial-subdeltoid bursa  communicates with the shoulder joint via the full-thickness rotator cuff tear. Glenohumeral Joint: Small shoulder joint effusion and moderate glenohumeral degenerative changes. The subacromial space appears narrowed. Labrum: Labral assessment is limited by the motion. The posteroinferior labrum appears degenerated without discrete tear or paralabral cyst. Bones: No acute or significant extra-articular osseous findings. Other: Soft tissue edema extends posterior to the shoulder without focal fluid collection. IMPRESSION: 1. Large full-thickness rotator cuff tear as described moderate traction of the supraspinatus and infraspinatus tendons. Mild muscular atrophy and moderate edema within and surrounding the infraspinatus muscle. 2. Moderate glenohumeral degenerative  changes with posteroinferior labral degeneration. 3. The biceps tendon appears intact. Electronically Signed   By: Richardean Sale M.D.   On: 06/21/2020 12:44   CT CHEST ABDOMEN PELVIS W CONTRAST  Result Date: 06/16/2020 CLINICAL DATA:  Fever of unknown origin EXAM: CT CHEST, ABDOMEN, AND PELVIS WITH CONTRAST TECHNIQUE: Multidetector CT imaging of the chest, abdomen and pelvis was performed following the standard protocol during bolus administration of intravenous contrast. CONTRAST:  156m OMNIPAQUE IOHEXOL 300 MG/ML  SOLN COMPARISON:  06/08/2020 FINDINGS: CT CHEST FINDINGS Cardiovascular: Thoracic aorta and its branches demonstrate mild atherosclerotic calcification. No aneurysmal dilatation or dissection is seen. Heart is mildly enlarged but stable. Coronary calcifications are noted. Although not timed for embolus evaluation common no large central pulmonary embolus is seen. Mediastinum/Nodes: Thoracic inlet is within normal limits. Mild mediastinal lipomatosis is noted. No sizable hilar or mediastinal adenopathy is noted. The esophagus is within normal limits. Lungs/Pleura: Scattered atelectatic changes are noted throughout both lungs without focal confluent infiltrate. These changes have worsened in the interval from the prior exam. No sizable parenchymal nodule is seen. No sizable pleural effusion is noted. Musculoskeletal: Degenerative change of the thoracic spine is noted. Healing right anterior sixth rib fracture is noted. CT ABDOMEN PELVIS FINDINGS Hepatobiliary: Mild fatty infiltration of the liver is noted. Gallbladder is within normal limits. Pancreas: Unremarkable. No pancreatic ductal dilatation or surrounding inflammatory changes. Spleen: Normal in size without focal abnormality. Adrenals/Urinary Tract: Adrenal glands are within normal limits. Kidneys demonstrate a normal enhancement pattern bilaterally. A few scattered hypodensities are noted most consistent with renal cysts. No obstructive  changes are seen. No renal calculi are noted. The bladder is decompressed. Stomach/Bowel: Scattered diverticular changes noted within the colon. No evidence of diverticulitis is seen. No obstructive changes of the colon are noted. The appendix is within normal limits. The small bowel and stomach are unremarkable. Vascular/Lymphatic: Aortic atherosclerosis. No enlarged abdominal or pelvic lymph nodes. Reproductive: Prostate is unremarkable. Other: No abdominal wall hernia or abnormality. No abdominopelvic ascites. Musculoskeletal: Degenerative changes of lumbar spine and right hip joint are noted. No compression deformities are seen. IMPRESSION: CT of the chest: New scattered atelectatic changes bilaterally. This may contribute to the patient's underlying fever. No associated effusion is seen. No other acute abnormality is noted in the chest. CT of the abdomen and pelvis: No acute abnormality is noted. Diverticular change without evidence of diverticulitis is noted. Scattered renal cysts are seen. Aortic Atherosclerosis (ICD10-I70.0). Electronically Signed   By: MInez CatalinaM.D.   On: 06/16/2020 15:12   CT CHEST ABDOMEN PELVIS W CONTRAST  Result Date: 06/08/2020 CLINICAL DATA:  Fall this morning with abdominal pain. EXAM: CT CHEST, ABDOMEN, AND PELVIS WITH CONTRAST TECHNIQUE: Multidetector CT imaging of the chest, abdomen and pelvis was performed following the standard protocol during bolus administration of intravenous contrast. CONTRAST:  108mOMNIPAQUE IOHEXOL 300 MG/ML  SOLN COMPARISON:  Same day  chest radiograph, chest CT dated 04/06/2020, abdominal ultrasound dated 03/10/2019. FINDINGS: CT CHEST FINDINGS Cardiovascular: Vascular calcifications are seen in the coronary arteries and aortic arch. The heart is mildly enlarged. No pericardial effusion. Mediastinum/Nodes: No enlarged mediastinal, hilar, or axillary lymph nodes. Thyroid gland, trachea, and esophagus demonstrate no significant findings.  Lungs/Pleura: There is a mild left lower lobe atelectasis. The right lung is clear. There is no pleural effusion or pneumothorax. Musculoskeletal: There is a subacute to chronic fracture of the anterior right sixth rib. This appears new since 04/06/2020. Degenerative changes are seen in the spine, most significant at T8-9. CT ABDOMEN PELVIS FINDINGS Hepatobiliary: The liver is hypoattenuating, consistent with hepatic steatosis. No focal liver abnormality is seen. No gallstones, gallbladder wall thickening, or biliary dilatation. Pancreas: Unremarkable. No pancreatic ductal dilatation or surrounding inflammatory changes. Spleen: Normal in size without focal abnormality. Adrenals/Urinary Tract: Adrenal glands are unremarkable. Kidneys are normal, without renal calculi, focal lesion, or hydronephrosis. Bladder is unremarkable. Stomach/Bowel: Stomach is within normal limits. Appendix appears normal. There is colonic diverticulosis without evidence of diverticulitis. No evidence of bowel wall thickening, distention, or inflammatory changes. Vascular/Lymphatic: Aortic atherosclerosis. No enlarged abdominal or pelvic lymph nodes. Reproductive: Prostate is unremarkable. Other: No abdominal wall hernia or abnormality. No abdominopelvic ascites. Musculoskeletal: Degenerative changes are seen in the spine, most significant at L1-2 and L3-4. Severe degenerative changes are seen in the right hip. IMPRESSION: 1. Subacute to chronic fracture of the anterior right sixth rib. 2. Hepatic steatosis. Aortic Atherosclerosis (ICD10-I70.0). Electronically Signed   By: Zerita Boers M.D.   On: 06/08/2020 11:55   DG Chest Port 1 View  Result Date: 06/15/2020 CLINICAL DATA:  Fever, history of COPD, asthma EXAM: PORTABLE CHEST 1 VIEW COMPARISON:  06/08/2020 FINDINGS: Single frontal view of the chest demonstrates stable enlargement of the cardiac silhouette. Chronic central vascular congestion without airspace disease, effusion, or  pneumothorax. No acute bony abnormalities. IMPRESSION: 1. Stable exam, no acute process. Electronically Signed   By: Randa Ngo M.D.   On: 06/15/2020 16:58   DG Chest Port 1 View  Result Date: 06/08/2020 CLINICAL DATA:  Questionable sepsis EXAM: PORTABLE CHEST 1 VIEW COMPARISON:  05/04/2020 FINDINGS: Cardiomegaly and vascular congestion, chronic. No Kerley lines. No air bronchogram. Hazy right apical pleural thickening which is stable. IMPRESSION: Cardiomegaly and vascular congestion. Electronically Signed   By: Monte Fantasia M.D.   On: 06/08/2020 09:54    Lab Data:  CBC: Recent Labs  Lab 06/17/20 0507 06/18/20 0338 06/19/20 0349 06/20/20 0733 06/21/20 0651  WBC 6.6 6.8 4.0 4.6 5.7  HGB 11.1* 10.5* 10.1* 11.1* 11.3*  HCT 33.2* 31.8* 31.4* 34.9* 36.0*  MCV 96.2 96.4 97.8 99.4 101.1*  PLT 199 258 243 319 947   Basic Metabolic Panel: Recent Labs  Lab 06/17/20 0507 06/18/20 0338 06/19/20 0349 06/20/20 0733 06/21/20 0651 06/22/20 0502 06/23/20 0611  NA 133* 131* 134* 136 136  --   --   K 3.1* 4.2 4.3 4.0 4.4  --   --   CL 78* 80* 84* 85* 87*  --   --   CO2 42* 40* 39* 43* 40*  --   --   GLUCOSE 101* 135* 139* 169* 98  --   --   BUN 16 33* 36* 26* 25*  --   --   CREATININE 0.50* 0.69 0.65 0.67 0.69  --   --   CALCIUM 8.6* 8.6* 8.7* 8.9 8.6*  --   --   MG 1.7 1.9  1.9 1.8 1.9 2.0 2.2  PHOS 3.6 5.1* 4.1 3.0 4.0 4.0 4.0   GFR: Estimated Creatinine Clearance: 158.1 mL/min (by C-G formula based on SCr of 0.69 mg/dL). Liver Function Tests: No results for input(s): AST, ALT, ALKPHOS, BILITOT, PROT, ALBUMIN in the last 168 hours. No results for input(s): LIPASE, AMYLASE in the last 168 hours. No results for input(s): AMMONIA in the last 168 hours. Coagulation Profile: No results for input(s): INR, PROTIME in the last 168 hours. Cardiac Enzymes: No results for input(s): CKTOTAL, CKMB, CKMBINDEX, TROPONINI in the last 168 hours. BNP (last 3 results) No results for  input(s): PROBNP in the last 8760 hours. HbA1C: No results for input(s): HGBA1C in the last 72 hours. CBG: No results for input(s): GLUCAP in the last 168 hours. Lipid Profile: No results for input(s): CHOL, HDL, LDLCALC, TRIG, CHOLHDL, LDLDIRECT in the last 72 hours. Thyroid Function Tests: No results for input(s): TSH, T4TOTAL, FREET4, T3FREE, THYROIDAB in the last 72 hours. Anemia Panel: No results for input(s): VITAMINB12, FOLATE, FERRITIN, TIBC, IRON, RETICCTPCT in the last 72 hours. Urine analysis:    Component Value Date/Time   COLORURINE AMBER (A) 06/15/2020 1620   APPEARANCEUR HAZY (A) 06/15/2020 1620   APPEARANCEUR Hazy (A) 12/25/2018 1442   LABSPEC 1.021 06/15/2020 1620   PHURINE 8.0 06/15/2020 1620   GLUCOSEU NEGATIVE 06/15/2020 1620   HGBUR NEGATIVE 06/15/2020 1620   BILIRUBINUR NEGATIVE 06/15/2020 1620   BILIRUBINUR Negative 12/25/2018 1442   KETONESUR NEGATIVE 06/15/2020 1620   PROTEINUR NEGATIVE 06/15/2020 1620   NITRITE NEGATIVE 06/15/2020 1620   LEUKOCYTESUR NEGATIVE 06/15/2020 1620     Vartan Kerins M.D. [facility] 06/23/2020, 2:36 PM  Available via Epic secure chat 7am-7pm After 7 pm, please refer to night coverage provider listed on amion.

## 2020-06-24 DIAGNOSIS — F1010 Alcohol abuse, uncomplicated: Secondary | ICD-10-CM | POA: Diagnosis not present

## 2020-06-24 DIAGNOSIS — F10230 Alcohol dependence with withdrawal, uncomplicated: Secondary | ICD-10-CM | POA: Diagnosis not present

## 2020-06-24 DIAGNOSIS — I4891 Unspecified atrial fibrillation: Secondary | ICD-10-CM | POA: Diagnosis not present

## 2020-06-24 DIAGNOSIS — I482 Chronic atrial fibrillation, unspecified: Secondary | ICD-10-CM | POA: Diagnosis not present

## 2020-06-24 LAB — BLOOD GAS, ARTERIAL
Acid-Base Excess: 17 mmol/L — ABNORMAL HIGH (ref 0.0–2.0)
Acid-Base Excess: 18.9 mmol/L — ABNORMAL HIGH (ref 0.0–2.0)
Bicarbonate: 46.3 mmol/L — ABNORMAL HIGH (ref 20.0–28.0)
Bicarbonate: 49.1 mmol/L — ABNORMAL HIGH (ref 20.0–28.0)
Expiratory PAP: 6
FIO2: 0.36
FIO2: 0.45
Inspiratory PAP: 16
O2 Saturation: 98.6 %
O2 Saturation: 98.3 %
Patient temperature: 37
Patient temperature: 37
pCO2 arterial: 82 mmHg (ref 32.0–48.0)
pCO2 arterial: 91 mmHg (ref 32.0–48.0)
pH, Arterial: 7.36 (ref 7.350–7.450)
pH, Arterial: 7.34 — ABNORMAL LOW (ref 7.350–7.450)
pO2, Arterial: 122 mmHg — ABNORMAL HIGH (ref 83.0–108.0)
pO2, Arterial: 115 mmHg — ABNORMAL HIGH (ref 83.0–108.0)

## 2020-06-24 LAB — BASIC METABOLIC PANEL
Anion gap: 9 (ref 5–15)
BUN: 28 mg/dL — ABNORMAL HIGH (ref 6–20)
CO2: 33 mmol/L — ABNORMAL HIGH (ref 22–32)
Calcium: 8.9 mg/dL (ref 8.9–10.3)
Chloride: 96 mmol/L — ABNORMAL LOW (ref 98–111)
Creatinine, Ser: 0.66 mg/dL (ref 0.61–1.24)
GFR, Estimated: >60 mL/min (ref >60)
Glucose, Bld: 148 mg/dL — ABNORMAL HIGH (ref 70–99)
Potassium: 4.8 mmol/L (ref 3.5–5.1)
Sodium: 138 mmol/L (ref 135–145)

## 2020-06-24 LAB — MAGNESIUM: Magnesium: 2 mg/dL (ref 1.7–2.4)

## 2020-06-24 LAB — PHOSPHORUS: Phosphorus: 4.4 mg/dL (ref 2.5–4.6)

## 2020-06-24 NOTE — Progress Notes (Signed)
Physical Therapy Treatment Patient Details Name: [name] [name] MRN: [id number] DOB: December 20, 1960 Today's Date: 06/24/2020    History of Present Illness Pt is a 60 y.o. 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  MD assessment includes: SIRS, acute on chronic respiratory failure, A-fib with RVR, alcohol withdrawal, R 6th rib Fx, and FTT.    PT Comments    Pt motivated to participate during the session and put forth good effort throughout.  Pt fatigued quickly with exertion, however, and required frequent therapeutic rest breaks during the session.  Pt's SpO2 and HR were both WNL throughout the session.  During transfer training the patient was able to come to partial stand at the EOB multiple times but could not come to complete upright standing position and was only able to stay in partial stand position for 1-2 seconds before needing to return to sitting secondary to LE weakness per patient. Pt will benefit from PT services in a SNF setting upon discharge to safely address deficits listed in patient problem list for decreased caregiver assistance and eventual return to PLOF.    Follow Up Recommendations  SNF;Supervision/Assistance - 24 hour     Equipment Recommendations  Rolling walker with 5" wheels    Recommendations for Other Services       Precautions / Restrictions Precautions Precautions: Fall Restrictions Weight Bearing Restrictions: No     Mobility  Bed Mobility               General bed mobility comments: NT, pt found sitting at EOB    Transfers Overall transfer level: Needs assistance Equipment used: Rolling walker (2 wheeled) Transfers: Sit to/from Stand Sit to Stand: From elevated surface;Min guard         General transfer comment: Pt able to do 2 x 5 partial sit to stands but unable to come to full upright position  Ambulation/Gait                 Stairs             Wheelchair Mobility    Modified Rankin (Stroke Patients Only)       Balance Overall balance assessment: Needs assistance Sitting-balance support: No upper extremity supported;Feet supported Sitting balance-Leahy Scale: Good                                      Cognition Arousal/Alertness: Awake/alert Behavior During Therapy: WFL for tasks assessed/performed Overall Cognitive Status: Within Functional Limits for tasks assessed                                        Exercises Total Joint Exercises Ankle Circles/Pumps: AROM;Strengthening;Both;10 reps Long Arc Quad: Strengthening;Both;10 reps (with manual resistance) Knee Flexion: Strengthening;Both;10 reps (with manual resistance) Other Exercises Other Exercises: Seated core therex with reaching outside BOS    General Comments        Pertinent Vitals/Pain Pain Assessment: 0-10 Pain  Score: 5  Pain Location: R foot and knee Pain Descriptors / Indicators: Sore Pain Intervention(s): Premedicated before session;Monitored during session    Home Living                      Prior Function            PT Goals (current goals can now be found in the care plan section) Progress towards PT goals: Progressing toward goals    Frequency    Min 2X/week      PT Plan Current plan remains appropriate    Co-evaluation              AM-PAC PT "6 Clicks" Mobility   Outcome Measure  Help needed turning from  your back to your side while in a flat bed without using bedrails?: A Little Help needed moving from lying on your back to sitting on the side of a flat bed without using bedrails?: A Little Help needed moving to and from a bed to a chair (including a wheelchair)?: A Lot Help needed standing up from a chair using your arms (e.g., wheelchair or bedside chair)?: A Lot Help needed to walk in hospital room?: Total Help needed climbing 3-5 steps with a railing? : Total 6 Click Score: 12    End of Session Equipment Utilized During Treatment: Oxygen Activity Tolerance: Patient tolerated treatment well Patient left: in bed;with call bell/phone within reach;with bed alarm set Nurse Communication: Mobility status PT Visit Diagnosis: Muscle weakness (generalized) (M62.81);Difficulty in walking, not elsewhere classified (R26.2)     Time: 1601-0932 PT Time Calculation (min) (ACUTE ONLY): 19 min  Charges:  $Therapeutic Exercise: 8-22 mins                     D. Scott Davy Faught PT, DPT 06/24/20, 1:00 PM

## 2020-06-24 NOTE — Progress Notes (Signed)
Pt received to unit and oriented to room. VSS and NAD noted. Reviewed am assessment as documented and agree. Pt requested pain medication and prn oxycodone to be administered. Pt denies any other immediate needs at this time and will continue to monitor.

## 2020-06-24 NOTE — Progress Notes (Addendum)
[facility]                                                                              Patient Demographics  [name] [name], is a 60 y.o. male, DOB - May 01, 1960, TML:[id number]  Admit date - 06/08/2020   Admitting Physician Amy August Luz, DO  Outpatient Primary MD for the patient is [name], DO  Outpatient specialists:   LOS - 16  days   Medical records reviewed and are as summarized below:    Chief Complaint  Patient presents with  . 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 fib, OSA, history of DVT, GERD, hypothyroid, hyperlipidemia, history of PE, chronic bilateral lower extremity venous stasis, presented with a fall.  Denied any loss of consciousness or hitting his head.  Reported abdominal pain.He told EMS that he did not want to go to the hospital and that he just wants to be left alone to die. On admission, reported nausea, baseline shortness of breath, alcohol use 4 to 5 hours prior to presentation.  Denied any SI and HI.[name] Presentation consistent with acute on chronic respiratory failure likely secondary to COPD/CHF.  Remains stepdown status.  Was on BiPAP since admission, taken off on 4/25, now tolerating O2 via [place].  Patient has been seen by palliative medicine.  DNR/DNI, would like to treat the treatable.  06/18/20 - 06/24/20  -Patient was admitted with acute on chronic respiratory failure, COPD, CHF exacerbation. -Improving, on Lasix 60 mg daily.  Intermittently requiring BiPAP (at night and PRN) on 2 L O2 -Hospitalization complicated with acute gout, chronic venous stasis bilateral with possible cellulitis.  Has completed IV Rocephin, prednisone course, continue colchicine, allopurinol -Complained of right  shoulder pain, chronic.  MRI showed full-thickness rotator cuff tear.  Orthopedics consulted, status post cortisone injection right shoulder  Currently awaiting skilled nursing facility placement (difficult placement).    Assessment & Plan   Acute on chronic respiratory failure with hypoxia with underlying COPD, OSA, OHS - Intermittently requiring BiPAP -Continue O2, BiPAP as needed.  Outpatient on 2 to 3 L chronically. -2D echo 04/07/2020 had shown EF of 30 to 68%, diastolic parameters normal -Currently improving, O2 sat 96% on 3 L via [place]   Acute on chronic systolic CHF exacerbation -2D echo 04/07/2020 had shown EF of 30 to 12%, diastolic parameters normal -Continue Lasix 60 mg daily, negative balance of 13.5 L, renal function stable, creatinine 0 point -Outpatient takes torsemide 80 mg twice daily  Acute gout, possible cellulitis, chronic venous stasis bilateral -On 5/1 and 5/2, patient had mild fever with bilateral feet and wrist pain, states consistent with his gout flares,  -Blood cultures negative to date, some erythema and tenderness of left lower extremity, has venous stasis dermatitis.  Initially placed on IV Rocephin, has completed course. -ESR 70, uric acid 6.5, CRP 10.2.  -Continue colchicine, allopurinol.  Discontinued prednisone on 5/9, received 7 days -Wound care  following, both lower extremities wrapped   Right shoulder pain, chronic secondary to rotator cuff tear - Right Shoulder x-ray on 3/6 was negative for any dislocation or fracture -MRI of the right shoulder showed full-thickness rotator cuff tear, orthopedics consulted -S/p cortisone injection in right shoulder on 5/8 by Dr Sabra Heck  -Continues to complain of pain, states has right hip arthritis, rotator cuff right shoulder, gout and osteoarthritis.  Continue current pain regimen, no further escalation.  Aerococcus bacteriuria -Typically a contaminant but pt endorses dysuria. Dr Si Raider discussed w/ pharm,  nitrofurantoin typically covers - Received 5 days of Macrobid  Atrial fibrillation with RVR -Likely due to acute alcohol withdrawal, has been weaned off of Cardizem drip -Heart rate controlled, continue metoprolol -Continue eliquis  Alcohol abuse -Drinks half a gallon of whiskey daily, no alcohol withdrawals currently Continue thiamine, Ativan as needed  Right sixth rib fracture Likely due to fall, acute to subacute -Continue pain control  Essential hypertension Stable, continue beta-blocker, Lasix  Hypokalemia, hyponatremia -Resolved, potassium 4.8, magnesium 2.6  Macrocytosis -Likely due to alcohol use, B12 folate normal  Depression Continue duloxetine  Hypothyroidism TSH 11.4, continue levothyroxine Compliance unclear, will need repeat TSH in 4 weeks.  OSA, OHS, morbid obesity Continue BiPAP qhs  Failure to thrive -APS involved and pursuing guardianship.  APS wants patient to remain here for the time being.  Unable to safely care for himself at home.  Will need skilled nursing care, high risk for readmissions.  Pressure injury -Right thigh, left posterior thigh stage II, present on admission -stage 2 sacrum, POA     Morbid obesity  Estimated body mass index is 58.81 kg/m as calculated from the following:   Height as of this encounter: 5' 8"  (1.727 m).   Weight as of this encounter: 175.5 kg.  Code Status: dnr DVT Prophylaxis:   apixaban (ELIQUIS) tablet 5 mg   Level of Care: Level of care: Med-Surg Family Communication: Discussed all imaging results, lab results, explained to the patient   Disposition Plan:     Status is: Inpatient  Remains inpatient appropriate because:Inpatient level of care appropriate due to severity of illness   Dispo: The patient is from: Home              Anticipated d/c is to: SNF              Patient currently is medically stable to d/c.  Awaiting skilled nursing facility bed    Difficult to place patient  Yes      Time Spent in minutes   57mns   Procedures:  None   Consultants:   Palliative medicine Orthopedics  Antimicrobials:   Anti-infectives (From admission, onward)   Start     Dose/Rate Route Frequency Ordered Stop   06/16/20 1000  nitrofurantoin (macrocrystal-monohydrate) (MACROBID) capsule 100 mg        100 mg Oral Every 12 hours 06/16/20 0843 06/16/20 2209   06/16/20 1000  cefTRIAXone (ROCEPHIN) 2 g in sodium chloride 0.9 % 100 mL IVPB        2 g 200 mL/hr over 30 Minutes Intravenous Every 24 hours 06/16/20 0843 06/20/20 1204   06/11/20 1200  nitrofurantoin (macrocrystal-monohydrate) (MACROBID) capsule 100 mg  Status:  Discontinued        100 mg Oral Every 12 hours 06/11/20 1026 06/16/20 0843   06/09/20 0300  vancomycin (VANCOREADY) IVPB 1750 mg/350 mL  Status:  Discontinued        1,750 mg 175 mL/hr over  120 Minutes Intravenous Every 12 hours 06/08/20 1610 06/09/20 0800   06/08/20 2200  metroNIDAZOLE (FLAGYL) IVPB 500 mg  Status:  Discontinued       Note to Pharmacy: He got one dose in the ED already. Please schedule appropriately.   500 mg 100 mL/hr over 60 Minutes Intravenous Every 8 hours 06/08/20 1436 06/09/20 0800   06/08/20 2000  ceFEPIme (MAXIPIME) 2 g in sodium chloride 0.9 % 100 mL IVPB  Status:  Discontinued        2 g 200 mL/hr over 30 Minutes Intravenous Every 8 hours 06/08/20 1447 06/09/20 0800   06/08/20 1445  metroNIDAZOLE (FLAGYL) IVPB 500 mg  Status:  Discontinued       Note to Pharmacy: He got one dose in the ED already. Please schedule appropriately.   500 mg 100 mL/hr over 60 Minutes Intravenous Every 8 hours 06/08/20 1434 06/08/20 1436   06/08/20 1445  vancomycin (VANCOCIN) IVPB 1000 mg/200 mL premix       "Followed by" Linked Group Details   1,000 mg 200 mL/hr over 60 Minutes Intravenous  Once 06/08/20 1441 06/08/20 1633   06/08/20 1445  vancomycin (VANCOREADY) IVPB 1500 mg/300 mL  Status:  Discontinued       "Followed by" Linked Group  Details   1,500 mg 150 mL/hr over 120 Minutes Intravenous  Once 06/08/20 1441 06/09/20 0717   06/08/20 1100  ceFEPIme (MAXIPIME) 2 g in sodium chloride 0.9 % 100 mL IVPB        2 g 200 mL/hr over 30 Minutes Intravenous  Once 06/08/20 1047 06/08/20 1331   06/08/20 1100  metroNIDAZOLE (FLAGYL) IVPB 500 mg        500 mg 100 mL/hr over 60 Minutes Intravenous  Once 06/08/20 1047 06/08/20 1529   06/08/20 1100  vancomycin (VANCOCIN) IVPB 1000 mg/200 mL premix  Status:  Discontinued        1,000 mg 200 mL/hr over 60 Minutes Intravenous  Once 06/08/20 1047 06/08/20 1441         Medications  Scheduled Meds: . allopurinol  100 mg Oral Daily  . apixaban  5 mg Oral BID  . vitamin C  500 mg Oral BID  . aspirin  81 mg Oral Daily  . atorvastatin  40 mg Oral Daily  . benzonatate  100 mg Oral TID  . Chlorhexidine Gluconate Cloth  6 each Topical Daily  . colchicine  0.6 mg Oral Daily  . DULoxetine  20 mg Oral Daily  . furosemide  60 mg Oral Daily  . gabapentin  300 mg Oral TID  . levothyroxine  75 mcg Oral QAC breakfast  . lisinopril  2.5 mg Oral Daily  . methocarbamol  750 mg Oral TID  . metoprolol tartrate  100 mg Oral BID  . montelukast  10 mg Oral QHS  . multivitamin with minerals  1 tablet Oral Daily  . polyethylene glycol  17 g Oral Daily  . Ensure Max Protein  11 oz Oral TID WC  . thiamine  100 mg Oral Daily   Or  . thiamine  100 mg Intravenous Daily   Continuous Infusions:  PRN Meds:.acetaminophen **OR** acetaminophen, albuterol, alum & mag hydroxide-simeth, guaiFENesin-dextromethorphan, HYDROmorphone (DILAUDID) injection, LORazepam, meclizine, metoprolol tartrate, midodrine, nitroGLYCERIN, ondansetron **OR** ondansetron (ZOFRAN) IV, oxyCODONE, traZODone      Subjective:   [name] [name] was seen and examined today.  States pain in the right shoulder improving.  Overall has pain due to osteoarthritis.  States had  a BM yesterday.  Patient denies any chest pain, shortness of  breath, abdominal pain, nausea or vomiting.  Objective:   Vitals:   06/23/20 2005 06/23/20 2127 06/24/20 0414 06/24/20 0844  BP: (!) 121/51 122/62 (!) 105/91 (!) 141/54  Pulse: 60 63 70 83  Resp: (!) 21  20 18   Temp: 98.3 F (36.8 C)  97.9 F (36.6 C) 97.7 F (36.5 C)  TempSrc:   Axillary   SpO2: 97%  98% 100%  Weight:   (!) 175.5 kg   Height:        Intake/Output Summary (Last 24 hours) at 06/24/2020 1143 Last data filed at 06/24/2020 0841 Gross per 24 hour  Intake 960 ml  Output 2350 ml  Net -1390 ml     Wt Readings from Last 3 Encounters:  06/24/20 (!) 175.5 kg  05/16/20 (!) 188.1 kg  04/30/20 (!) 186 kg   Physical Exam  General: Alert and oriented x 3, NAD  Cardiovascular: S1 S2 clear, RRR.   Respiratory: Diminished breath sounds at the bases  Gastrointestinal: Soft, nontender, nondistended, NBS  Ext: both lower extremities wrapped  Neuro: no new deficits  Musculoskeletal: No cyanosis, clubbing  Skin: Both lower extremities wrapped, stage once sacral decub  Psych: Normal affect and demeanor, alert and oriented x3     Data Reviewed:  I have personally reviewed following labs and imaging studies  Micro Results Recent Results (from the past 240 hour(s))  CULTURE, BLOOD (ROUTINE X 2) w Reflex to ID Panel     Status: None   Collection Time: 06/15/20  5:59 PM   Specimen: BLOOD  Result Value Ref Range Status   Specimen Description BLOOD BLOOD LEFT HAND  Final   Special Requests   Final    BOTTLES DRAWN AEROBIC AND ANAEROBIC Blood Culture results may not be optimal due to an inadequate volume of blood received in culture bottles   Culture   Final    NO GROWTH 5 DAYS Performed at [facility], [address] [place], [place] [address]    Report Status 06/20/2020 FINAL  Final  CULTURE, BLOOD (ROUTINE X 2) w Reflex to ID Panel     Status: None   Collection Time: 06/15/20  6:01 PM   Specimen: BLOOD  Result Value Ref Range Status   Specimen  Description BLOOD BLOOD RIGHT HAND  Final   Special Requests   Final    BOTTLES DRAWN AEROBIC ONLY Blood Culture results may not be optimal due to an inadequate volume of blood received in culture bottles   Culture   Final    NO GROWTH 5 DAYS Performed at [facility], [address] [place], [place] [address]    Report Status 06/20/2020 FINAL  Final    Radiology Reports MR SHOULDER RIGHT WO CONTRAST  Result Date: 06/21/2020 CLINICAL DATA:  Chronic right shoulder pain with limited range of motion. No acute injury or prior relevant surgery. EXAM: MRI OF THE RIGHT SHOULDER WITHOUT CONTRAST TECHNIQUE: Multiplanar, multisequence MR imaging of the shoulder was performed. No intravenous contrast was administered. COMPARISON:  Radiographs 04/20/2020 FINDINGS: Despite efforts by the technologist and patient, moderate motion artifact is present on today's exam and could not be eliminated. This reduces exam sensitivity and specificity. Patient was unable to complete the study. No sagittal T1 weighted images were obtained. Rotator cuff: There is a large full-thickness rotator cuff tear with full-thickness retracted insertional tears of the supraspinatus and infraspinatus tendons. Both tendons are retracted nearly  to the level of the glenoid. The subscapularis and teres minor tendons appear intact. Muscles: There is edema within and surrounding the infraspinatus muscle. There is mild atrophy of the supraspinatus and infraspinatus muscles. Biceps long head:  Intact and normally positioned. Acromioclavicular Joint: The acromion is type 1. There are mild acromioclavicular degenerative changes. A moderate amount of fluid in the subacromial-subdeltoid bursa communicates with the shoulder joint via the full-thickness rotator cuff tear. Glenohumeral Joint: Small shoulder joint effusion and moderate glenohumeral degenerative changes. The subacromial space appears narrowed. Labrum: Labral assessment is limited by  the motion. The posteroinferior labrum appears degenerated without discrete tear or paralabral cyst. Bones: No acute or significant extra-articular osseous findings. Other: Soft tissue edema extends posterior to the shoulder without focal fluid collection. IMPRESSION: 1. Large full-thickness rotator cuff tear as described moderate traction of the supraspinatus and infraspinatus tendons. Mild muscular atrophy and moderate edema within and surrounding the infraspinatus muscle. 2. Moderate glenohumeral degenerative changes with posteroinferior labral degeneration. 3. The biceps tendon appears intact. Electronically Signed   By: Richardean Sale M.D.   On: 06/21/2020 12:44   CT CHEST ABDOMEN PELVIS W CONTRAST  Result Date: 06/16/2020 CLINICAL DATA:  Fever of unknown origin EXAM: CT CHEST, ABDOMEN, AND PELVIS WITH CONTRAST TECHNIQUE: Multidetector CT imaging of the chest, abdomen and pelvis was performed following the standard protocol during bolus administration of intravenous contrast. CONTRAST:  128m OMNIPAQUE IOHEXOL 300 MG/ML  SOLN COMPARISON:  06/08/2020 FINDINGS: CT CHEST FINDINGS Cardiovascular: Thoracic aorta and its branches demonstrate mild atherosclerotic calcification. No aneurysmal dilatation or dissection is seen. Heart is mildly enlarged but stable. Coronary calcifications are noted. Although not timed for embolus evaluation common no large central pulmonary embolus is seen. Mediastinum/Nodes: Thoracic inlet is within normal limits. Mild mediastinal lipomatosis is noted. No sizable hilar or mediastinal adenopathy is noted. The esophagus is within normal limits. Lungs/Pleura: Scattered atelectatic changes are noted throughout both lungs without focal confluent infiltrate. These changes have worsened in the interval from the prior exam. No sizable parenchymal nodule is seen. No sizable pleural effusion is noted. Musculoskeletal: Degenerative change of the thoracic spine is noted. Healing right anterior  sixth rib fracture is noted. CT ABDOMEN PELVIS FINDINGS Hepatobiliary: Mild fatty infiltration of the liver is noted. Gallbladder is within normal limits. Pancreas: Unremarkable. No pancreatic ductal dilatation or surrounding inflammatory changes. Spleen: Normal in size without focal abnormality. Adrenals/Urinary Tract: Adrenal glands are within normal limits. Kidneys demonstrate a normal enhancement pattern bilaterally. A few scattered hypodensities are noted most consistent with renal cysts. No obstructive changes are seen. No renal calculi are noted. The bladder is decompressed. Stomach/Bowel: Scattered diverticular changes noted within the colon. No evidence of diverticulitis is seen. No obstructive changes of the colon are noted. The appendix is within normal limits. The small bowel and stomach are unremarkable. Vascular/Lymphatic: Aortic atherosclerosis. No enlarged abdominal or pelvic lymph nodes. Reproductive: Prostate is unremarkable. Other: No abdominal wall hernia or abnormality. No abdominopelvic ascites. Musculoskeletal: Degenerative changes of lumbar spine and right hip joint are noted. No compression deformities are seen. IMPRESSION: CT of the chest: New scattered atelectatic changes bilaterally. This may contribute to the patient's underlying fever. No associated effusion is seen. No other acute abnormality is noted in the chest. CT of the abdomen and pelvis: No acute abnormality is noted. Diverticular change without evidence of diverticulitis is noted. Scattered renal cysts are seen. Aortic Atherosclerosis (ICD10-I70.0). Electronically Signed   By: MInez CatalinaM.D.   On: 06/16/2020 15:12  CT CHEST ABDOMEN PELVIS W CONTRAST  Result Date: 06/08/2020 CLINICAL DATA:  Fall this morning with abdominal pain. EXAM: CT CHEST, ABDOMEN, AND PELVIS WITH CONTRAST TECHNIQUE: Multidetector CT imaging of the chest, abdomen and pelvis was performed following the standard protocol during bolus administration of  intravenous contrast. CONTRAST:  160m OMNIPAQUE IOHEXOL 300 MG/ML  SOLN COMPARISON:  Same day chest radiograph, chest CT dated 04/06/2020, abdominal ultrasound dated 03/10/2019. FINDINGS: CT CHEST FINDINGS Cardiovascular: Vascular calcifications are seen in the coronary arteries and aortic arch. The heart is mildly enlarged. No pericardial effusion. Mediastinum/Nodes: No enlarged mediastinal, hilar, or axillary lymph nodes. Thyroid gland, trachea, and esophagus demonstrate no significant findings. Lungs/Pleura: There is a mild left lower lobe atelectasis. The right lung is clear. There is no pleural effusion or pneumothorax. Musculoskeletal: There is a subacute to chronic fracture of the anterior right sixth rib. This appears new since 04/06/2020. Degenerative changes are seen in the spine, most significant at T8-9. CT ABDOMEN PELVIS FINDINGS Hepatobiliary: The liver is hypoattenuating, consistent with hepatic steatosis. No focal liver abnormality is seen. No gallstones, gallbladder wall thickening, or biliary dilatation. Pancreas: Unremarkable. No pancreatic ductal dilatation or surrounding inflammatory changes. Spleen: Normal in size without focal abnormality. Adrenals/Urinary Tract: Adrenal glands are unremarkable. Kidneys are normal, without renal calculi, focal lesion, or hydronephrosis. Bladder is unremarkable. Stomach/Bowel: Stomach is within normal limits. Appendix appears normal. There is colonic diverticulosis without evidence of diverticulitis. No evidence of bowel wall thickening, distention, or inflammatory changes. Vascular/Lymphatic: Aortic atherosclerosis. No enlarged abdominal or pelvic lymph nodes. Reproductive: Prostate is unremarkable. Other: No abdominal wall hernia or abnormality. No abdominopelvic ascites. Musculoskeletal: Degenerative changes are seen in the spine, most significant at L1-2 and L3-4. Severe degenerative changes are seen in the right hip. IMPRESSION: 1. Subacute to chronic  fracture of the anterior right sixth rib. 2. Hepatic steatosis. Aortic Atherosclerosis (ICD10-I70.0). Electronically Signed   By: TZerita BoersM.D.   On: 06/08/2020 11:55   DG Chest Port 1 View  Result Date: 06/15/2020 CLINICAL DATA:  Fever, history of COPD, asthma EXAM: PORTABLE CHEST 1 VIEW COMPARISON:  06/08/2020 FINDINGS: Single frontal view of the chest demonstrates stable enlargement of the cardiac silhouette. Chronic central vascular congestion without airspace disease, effusion, or pneumothorax. No acute bony abnormalities. IMPRESSION: 1. Stable exam, no acute process. Electronically Signed   By: MRanda NgoM.D.   On: 06/15/2020 16:58   DG Chest Port 1 View  Result Date: 06/08/2020 CLINICAL DATA:  Questionable sepsis EXAM: PORTABLE CHEST 1 VIEW COMPARISON:  05/04/2020 FINDINGS: Cardiomegaly and vascular congestion, chronic. No Kerley lines. No air bronchogram. Hazy right apical pleural thickening which is stable. IMPRESSION: Cardiomegaly and vascular congestion. Electronically Signed   By: JMonte FantasiaM.D.   On: 06/08/2020 09:54    Lab Data:  CBC: Recent Labs  Lab 06/18/20 0338 06/19/20 0349 06/20/20 0733 06/21/20 0651  WBC 6.8 4.0 4.6 5.7  HGB 10.5* 10.1* 11.1* 11.3*  HCT 31.8* 31.4* 34.9* 36.0*  MCV 96.4 97.8 99.4 101.1*  PLT 258 243 319 2169  Basic Metabolic Panel: Recent Labs  Lab 06/19/20 0349 06/20/20 0733 06/21/20 0651 06/22/20 0502 06/23/20 0611 06/23/20 1829 06/24/20 0353  NA 134* 136 136  --   --  133* 138  K 4.3 4.0 4.4  --   --  5.3* 4.8  CL 84* 85* 87*  --   --  92* 96*  CO2 39* 43* 40*  --   --  32 33*  GLUCOSE 139* 169* 98  --   --  209* 148*  BUN 36* 26* 25*  --   --  29* 28*  CREATININE 0.65 0.67 0.69  --   --  0.70 0.66  CALCIUM 8.7* 8.9 8.6*  --   --  8.6* 8.9  MG 1.9 1.8 1.9 2.0 2.2  --  2.0  PHOS 4.1 3.0 4.0 4.0 4.0  --  4.4   GFR: Estimated Creatinine Clearance: 156.4 mL/min (by C-G formula based on SCr of 0.66 mg/dL). Liver  Function Tests: No results for input(s): AST, ALT, ALKPHOS, BILITOT, PROT, ALBUMIN in the last 168 hours. No results for input(s): LIPASE, AMYLASE in the last 168 hours. No results for input(s): AMMONIA in the last 168 hours. Coagulation Profile: No results for input(s): INR, PROTIME in the last 168 hours. Cardiac Enzymes: No results for input(s): CKTOTAL, CKMB, CKMBINDEX, TROPONINI in the last 168 hours. BNP (last 3 results) No results for input(s): PROBNP in the last 8760 hours. HbA1C: No results for input(s): HGBA1C in the last 72 hours. CBG: No results for input(s): GLUCAP in the last 168 hours. Lipid Profile: No results for input(s): CHOL, HDL, LDLCALC, TRIG, CHOLHDL, LDLDIRECT in the last 72 hours. Thyroid Function Tests: No results for input(s): TSH, T4TOTAL, FREET4, T3FREE, THYROIDAB in the last 72 hours. Anemia Panel: No results for input(s): VITAMINB12, FOLATE, FERRITIN, TIBC, IRON, RETICCTPCT in the last 72 hours. Urine analysis:    Component Value Date/Time   COLORURINE AMBER (A) 06/15/2020 1620   APPEARANCEUR HAZY (A) 06/15/2020 1620   APPEARANCEUR Hazy (A) 12/25/2018 1442   LABSPEC 1.021 06/15/2020 1620   PHURINE 8.0 06/15/2020 1620   GLUCOSEU NEGATIVE 06/15/2020 1620   HGBUR NEGATIVE 06/15/2020 1620   BILIRUBINUR NEGATIVE 06/15/2020 1620   BILIRUBINUR Negative 12/25/2018 1442   KETONESUR NEGATIVE 06/15/2020 1620   PROTEINUR NEGATIVE 06/15/2020 1620   NITRITE NEGATIVE 06/15/2020 1620   LEUKOCYTESUR NEGATIVE 06/15/2020 1620     Pearle Wandler M.D. [facility] 06/24/2020, 11:43 AM  Available via Epic secure chat 7am-7pm After 7 pm, please refer to night coverage provider listed on amion.

## 2020-06-25 ENCOUNTER — Encounter: Payer: Self-pay | Admitting: Internal Medicine

## 2020-06-25 DIAGNOSIS — I4891 Unspecified atrial fibrillation: Secondary | ICD-10-CM | POA: Diagnosis not present

## 2020-06-25 DIAGNOSIS — J432 Centrilobular emphysema: Secondary | ICD-10-CM | POA: Diagnosis not present

## 2020-06-25 DIAGNOSIS — F10230 Alcohol dependence with withdrawal, uncomplicated: Secondary | ICD-10-CM | POA: Diagnosis not present

## 2020-06-25 LAB — BASIC METABOLIC PANEL
Anion gap: 6 (ref 5–15)
BUN: 30 mg/dL — ABNORMAL HIGH (ref 6–20)
CO2: 36 mmol/L — ABNORMAL HIGH (ref 22–32)
Calcium: 8.9 mg/dL (ref 8.9–10.3)
Chloride: 95 mmol/L — ABNORMAL LOW (ref 98–111)
Creatinine, Ser: 0.73 mg/dL (ref 0.61–1.24)
GFR, Estimated: >60 mL/min (ref >60)
Glucose, Bld: 130 mg/dL — ABNORMAL HIGH (ref 70–99)
Potassium: 4.5 mmol/L (ref 3.5–5.1)
Sodium: 137 mmol/L (ref 135–145)

## 2020-06-25 LAB — MAGNESIUM: Magnesium: 2 mg/dL (ref 1.7–2.4)

## 2020-06-25 LAB — PHOSPHORUS: Phosphorus: 5.8 mg/dL — ABNORMAL HIGH (ref 2.5–4.6)

## 2020-06-25 NOTE — TOC Progression Note (Addendum)
Transition of Care [facility]) - Progression Note    Patient Details  Name: [name] [name] MRN: [id number] Date of Birth: 08/04/1960  Transition of Care [facility]) CM/SW Contact  Margarito Liner, LCSW Phone Number: 06/25/2020, 8:46 AM  Clinical Narrative: Sent therapy notes to SNF's that are still pending and included note that DSS will private pay for SNF so that patient does not have to use his Medicaid check.   10:53 am: Updated DSS about [facility]. Spoke with [facility] hospital liaison. She will see when they have a bed open up vs if a bed would be available at [facility] in [place]. DSS is willing to cover stay for 2 months. [facility] liaison will see if they can accept that or if patient would have to use his Medicaid.   2:14 pm: [facility] in [place] is able to offer a bed if they get in writing that DSS will private pay. Left DSS social worker a voicemail with cost information. Will wait to provide bed offers to patient until all information obtained.  Expected Discharge Plan: Skilled Nursing Facility Barriers to Discharge: Continued Medical Work up  Expected Discharge Plan and Services Expected Discharge Plan: Skilled Nursing Facility     Post Acute Care Choice: Skilled Nursing Facility Living arrangements for the past 2 months: Apartment                   DME Agency: [facility] Date DME Agency Contacted: 06/12/20 Time DME Agency Contacted: [phone] Representative spoke with at DME Agency: Delila Spence reporesentative stated they woudl not continue services for the patient due to intoxication.             Social Determinants of Health (SDOH) Interventions    Readmission Risk Interventions Readmission Risk Prevention Plan 01/11/2020 12/24/2019 01/25/2019  Transportation Screening Complete Complete Complete  PCP or Specialist Appt within 3-5 Days - - -  HRI or Home Care Consult - - -  Social Work Consult for Recovery Care Planning/Counseling - - -   Palliative Care Screening - - -  Medication Review [facility]) Complete Complete Complete  PCP or Specialist appointment within 3-5 days of discharge Complete Complete Complete  HRI or Home Care Consult - - Complete  SW Recovery Care/Counseling Consult Complete Complete Complete  Palliative Care Screening Complete Not Applicable Not Applicable  Skilled Nursing Facility Not Applicable Not Applicable Not Applicable

## 2020-06-25 NOTE — TOC Progression Note (Signed)
Transition of Care [facility]) - Progression Note    Patient Details  Name: [name] [name] MRN: [id number] Date of Birth: 08/04/1960  Transition of Care [facility]) CM/SW Contact  Maree Krabbe, LCSW Phone Number: 06/25/2020, 9:16 AM  Clinical Narrative:   [facility] in [place] can take pt when they have a private room available. Pt has been put on the list. Pt will need a private room due to MRSA.    Expected Discharge Plan: Skilled Nursing Facility Barriers to Discharge: Continued Medical Work up  Expected Discharge Plan and Services Expected Discharge Plan: Skilled Nursing Facility     Post Acute Care Choice: Skilled Nursing Facility Living arrangements for the past 2 months: Apartment                   DME Agency: [facility] Care Date DME Agency Contacted: 06/12/20 Time DME Agency Contacted: [phone] Representative spoke with at DME Agency: Delila Spence reporesentative stated they woudl not continue services for the patient due to intoxication.             Social Determinants of Health (SDOH) Interventions    Readmission Risk Interventions Readmission Risk Prevention Plan 01/11/2020 12/24/2019 01/25/2019  Transportation Screening Complete Complete Complete  PCP or Specialist Appt within 3-5 Days - - -  HRI or Home Care Consult - - -  Social Work Consult for Recovery Care Planning/Counseling - - -  Palliative Care Screening - - -  Medication Review Oceanographer) Complete Complete Complete  PCP or Specialist appointment within 3-5 days of discharge Complete Complete Complete  HRI or Home Care Consult - - Complete  SW Recovery Care/Counseling Consult Complete Complete Complete  Palliative Care Screening Complete Not Applicable Not Applicable  Skilled Nursing Facility Not Applicable Not Applicable Not Applicable

## 2020-06-25 NOTE — Progress Notes (Signed)
Pt refusing leg dressing changes and states that he only wants them to be changed if replaced exactly how they currently are.  RN dressing change orders are different than what the pt currently has. Order received for WOC to change dressings and clarify RN dressing orders.

## 2020-06-25 NOTE — Progress Notes (Addendum)
Progress Note    [name] [name]  HBZ:[id number] DOB: Oct 15, 1960  DOA: 06/08/2020 PCP: [name], DO      Brief Narrative:    Medical records reviewed and are as summarized below:  [name] [name] is a 60 y.o. 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 fib, history of DVT and PE, GERD, hypothyroid, hyperlipidemia, history of PE, chronic bilateral lower extremity venous stasis.  He was brought to the hospital after a fall at home.  He was admitted to the hospital for acute on chronic respiratory failure secondary to COPD exacerbation and CHF exacerbation.  He was also found to have right sixth rib fracture likely from fall at home.  He was treated with steroids, bronchodilators and IV Lasix.  He also developed atrial fibrillation with RVR requiring IV Cardizem infusion.  He was treated with Ativan for alcohol withdrawal syndrome.  Hospital course was complicated by acute gout and possible lower extremity cellulitis.  He was treated with IV antibiotics, steroids, colchicine and allopurinol.  He was found to have full-thickness rotator cuff tear of the right shoulder.  Orthopedic surgery was consulted and his right shoulder was injected with Xylocaine and Kenalog.  Outpatient follow-up with orthopedic surgeon was recommended.    Assessment/Plan:   Active Problems:   Centrilobular emphysema (HCC)   Sepsis (HCC)   Chronic atrial fibrillation (HCC)   HTN (hypertension)   Chronic gout involving toe, unspecified cause, unspecified laterality   TBI (traumatic brain injury) (HCC)   Personal history of DVT (deep vein thrombosis)   Atrial fibrillation with RVR (HCC)   Major depressive disorder, recurrent episode, in partial remission (HCC)   HLD  (hyperlipidemia)   Depression   Pressure ulcer   Alcohol abuse   Alcohol withdrawal syndrome without complication (HCC)   GAD (generalized anxiety disorder)   COPD (chronic obstructive pulmonary disease) (HCC)   Atrial fibrillation, chronic (HCC)   Class 3 obesity with alveolar hypoventilation, serious comorbidity, and body mass index (BMI) of 60.0 to 69.9 in adult [facility])   CHF (congestive heart failure) (HCC)   Weakness of both lower extremities   Physical deconditioning   Elevated lactic acid level   Unable to care for self   Nutrition Problem: Increased nutrient needs Etiology: wound healing  Signs/Symptoms: estimated needs   Body mass index is 58.81 kg/m.  (Morbid obesity)   Acute on chronic systolic CHF: Improved.  Continue Lasix.  2D echo on 04/07/2020 showed EF estimated at 30 to 35%.  Acute on chronic hypoxic respiratory failure, OSA, OHS, COPD: Continue bronchodilators, steroids as per minute oxygen during the day and BiPAP at night.  Acute gout involving bilateral feet and wrists: Completed 7 days of prednisone.  Continue colchicine and allopurinol.  Right shoulder pain, right rotator cuff tear, right sixth rib fracture: Analgesics as needed for pain.  Right shoulder was injected with Xylocaine and Kenalog on 06/22/2020.  Outpatient follow-up with orthopedic surgeon.  Failure to thrive: Patient is unable to care for himself at home.  DSS is involved.  Awaiting placement to SNF.  Stage II decubitus ulcers on the right thigh, left posterior thigh and sacrum (present on admission): Continue local wound care.  Persistent atrial fibrillation: Continue metoprolol and Eliquis  Cellulitis of bilateral lower extremities, chronic venous stasis, Aerococcus bacteriuria: Completed antibiotics.  Other comorbidities include hypertension, depression, hypothyroidism, alcohol use disorder   Diet Order            Diet Carb Modified Fluid consistency: Thin; Room service appropriate?  Yes  Diet effective now                    Consultants:  Orthopedic surgeon  Procedures:  None    Medications:   . allopurinol  100 mg Oral Daily  . apixaban  5 mg Oral BID  . vitamin C  500 mg Oral BID  . aspirin  81 mg Oral Daily  . atorvastatin  40 mg Oral Daily  . benzonatate  100 mg Oral TID  . colchicine  0.6 mg Oral Daily  . DULoxetine  20 mg Oral Daily  . furosemide  60 mg Oral Daily  . gabapentin  300 mg Oral TID  . levothyroxine  75 mcg Oral QAC breakfast  . lisinopril  2.5 mg Oral Daily  . methocarbamol  750 mg Oral TID  . metoprolol tartrate  100 mg Oral BID  . montelukast  10 mg Oral QHS  . multivitamin with minerals  1 tablet Oral Daily  . polyethylene glycol  17 g Oral Daily  . Ensure Max Protein  11 oz Oral TID WC  . thiamine  100 mg Oral Daily   Or  . thiamine  100 mg Intravenous Daily   Continuous Infusions:   Anti-infectives (From admission, onward)   Start     Dose/Rate Route Frequency Ordered Stop   06/16/20 1000  nitrofurantoin (macrocrystal-monohydrate) (MACROBID) capsule 100 mg        100 mg Oral Every 12 hours 06/16/20 0843 06/16/20 2209   06/16/20 1000  cefTRIAXone (ROCEPHIN) 2 g in sodium chloride 0.9 % 100 mL IVPB        2 g 200 mL/hr over 30 Minutes Intravenous Every 24 hours 06/16/20 0843 06/20/20 1204   06/11/20 1200  nitrofurantoin (macrocrystal-monohydrate) (MACROBID) capsule 100 mg  Status:  Discontinued        100 mg Oral Every 12 hours 06/11/20 1026 06/16/20 0843   06/09/20 0300  vancomycin (VANCOREADY) IVPB 1750 mg/350 mL  Status:  Discontinued        1,750 mg 175 mL/hr over 120 Minutes Intravenous Every 12 hours 06/08/20 1610 06/09/20 0800   06/08/20 2200  metroNIDAZOLE (FLAGYL) IVPB 500 mg  Status:  Discontinued       Note to Pharmacy: He got one dose in the ED already. Please schedule appropriately.   500 mg 100 mL/hr over 60 Minutes Intravenous Every 8 hours 06/08/20 1436 06/09/20 0800   06/08/20 2000  ceFEPIme  (MAXIPIME) 2 g in sodium chloride 0.9 % 100 mL IVPB  Status:  Discontinued        2 g 200 mL/hr over 30 Minutes Intravenous Every 8 hours 06/08/20 1447 06/09/20 0800   06/08/20 1445  metroNIDAZOLE (FLAGYL) IVPB 500 mg  Status:  Discontinued       Note to Pharmacy: He got one dose in the ED already. Please schedule appropriately.   500 mg 100 mL/hr over 60 Minutes Intravenous Every 8 hours 06/08/20 1434 06/08/20 1436   06/08/20 1445  vancomycin (VANCOCIN) IVPB 1000 mg/200 mL premix       "Followed by" Linked Group Details   1,000 mg 200 mL/hr over 60 Minutes Intravenous  Once 06/08/20 1441 06/08/20 1633   06/08/20 1445  vancomycin (VANCOREADY) IVPB 1500 mg/300 mL  Status:  Discontinued       "Followed by" Linked Group Details   1,500 mg 150 mL/hr over 120 Minutes Intravenous  Once 06/08/20 1441 06/09/20 0717   06/08/20 1100  ceFEPIme (MAXIPIME) 2 g in sodium chloride 0.9 % 100 mL IVPB        2 g 200 mL/hr over 30 Minutes Intravenous  Once 06/08/20 1047 06/08/20 1331   06/08/20 1100  metroNIDAZOLE (FLAGYL) IVPB 500 mg        500 mg 100 mL/hr over 60 Minutes Intravenous  Once 06/08/20 1047 06/08/20 1529   06/08/20 1100  vancomycin (VANCOCIN) IVPB 1000 mg/200 mL premix  Status:  Discontinued        1,000 mg 200 mL/hr over 60 Minutes Intravenous  Once 06/08/20 1047 06/08/20 1441             Family Communication/Anticipated D/C date and plan/Code Status   DVT prophylaxis:  apixaban (ELIQUIS) tablet 5 mg     Code Status: DNR  Family Communication: None Disposition Plan:    Status is: Inpatient  Remains inpatient appropriate because:Inpatient level of care appropriate due to severity of illness   Dispo: The patient is from: Home              Anticipated d/c is to: SNF              Patient currently is medically stable to d/c.   Difficult to place patient Yes           Subjective:   Interval events noted.  He said he was able to move from bed to chair today.   He is frustrated he still has to wait for rehab.  Objective:    Vitals:   06/24/20 2049 06/25/20 0346 06/25/20 0847 06/25/20 1109  BP: 117/68 121/90 (!) 131/97 117/90  Pulse: 61 61 80 62  Resp: 16 16 20 17   Temp: 98.2 F (36.8 C)   98.1 F (36.7 C)  TempSrc: Oral   Oral  SpO2: 96% 100% 100% 100%  Weight:      Height:       No data found.   Intake/Output Summary (Last 24 hours) at 06/25/2020 1335 Last data filed at 06/25/2020 1234 Gross per 24 hour  Intake --  Output 1500 ml  Net -1500 ml   Filed Weights   06/22/20 0500 06/23/20 0538 06/24/20 0414  Weight: (!) 179.2 kg (!) 178.5 kg (!) 175.5 kg    Exam:  GEN: NAD SKIN: Warm and dry EYES: No pallor or icterus ENT: MMM CV: RRR PULM: CTA B ABD: soft, obese, NT, +BS CNS: AAO x 3, non focal EXT: No edema or tenderness    Pressure Injury 06/08/20 Thigh Right;Left;Posterior Stage 2 -  Partial thickness loss of dermis presenting as a shallow open injury with a red, pink wound bed without slough. pressure ulcer (Active)  06/08/20 2200  Location: Thigh  Location Orientation: Right;Left;Posterior  Staging: Stage 2 -  Partial thickness loss of dermis presenting as a shallow open injury with a red, pink wound bed without slough.  Wound Description (Comments): pressure ulcer  Present on Admission:      Pressure Injury Thigh Left;Posterior;Proximal Stage 2 -  Partial thickness loss of dermis presenting as a shallow open injury with a red, pink wound bed without slough. (Active)     Location: Thigh  Location Orientation: Left;Posterior;Proximal  Staging: Stage 2 -  Partial thickness loss of dermis presenting as a shallow open injury with a  red, pink wound bed without slough.  Wound Description (Comments):   Present on Admission: Yes     Pressure Injury 06/08/20 Thigh Posterior;Proximal;Right (Active)  06/08/20 0000  Location: Thigh  Location Orientation: Posterior;Proximal;Right  Staging:   Wound Description  (Comments):   Present on Admission:      Pressure Injury 06/08/20 Posterior;Lower Stage 2 -  Partial thickness loss of dermis presenting as a shallow open injury with a red, pink wound bed without slough. (Active)  06/08/20 2000  Location:   Location Orientation: Posterior;Lower  Staging: Stage 2 -  Partial thickness loss of dermis presenting as a shallow open injury with a red, pink wound bed without slough.  Wound Description (Comments):   Present on Admission: Yes     Data Reviewed:   I have personally reviewed following labs and imaging studies:  Labs: Labs show the following:   Basic Metabolic Panel: Recent Labs  Lab 06/20/20 0733 06/21/20 0651 06/22/20 0502 06/23/20 0611 06/23/20 1829 06/24/20 0353 06/25/20 0400  NA 136 136  --   --  133* 138 137  K 4.0 4.4  --   --  5.3* 4.8 4.5  CL 85* 87*  --   --  92* 96* 95*  CO2 43* 40*  --   --  32 33* 36*  GLUCOSE 169* 98  --   --  209* 148* 130*  BUN 26* 25*  --   --  29* 28* 30*  CREATININE 0.67 0.69  --   --  0.70 0.66 0.73  CALCIUM 8.9 8.6*  --   --  8.6* 8.9 8.9  MG 1.8 1.9 2.0 2.2  --  2.0 2.0  PHOS 3.0 4.0 4.0 4.0  --  4.4 5.8*   GFR Estimated Creatinine Clearance: 156.4 mL/min (by C-G formula based on SCr of 0.73 mg/dL). Liver Function Tests: No results for input(s): AST, ALT, ALKPHOS, BILITOT, PROT, ALBUMIN in the last 168 hours. No results for input(s): LIPASE, AMYLASE in the last 168 hours. No results for input(s): AMMONIA in the last 168 hours. Coagulation profile No results for input(s): INR, PROTIME in the last 168 hours.  CBC: Recent Labs  Lab 06/19/20 0349 06/20/20 0733 06/21/20 0651  WBC 4.0 4.6 5.7  HGB 10.1* 11.1* 11.3*  HCT 31.4* 34.9* 36.0*  MCV 97.8 99.4 101.1*  PLT 243 319 297   Cardiac Enzymes: No results for input(s): CKTOTAL, CKMB, CKMBINDEX, TROPONINI in the last 168 hours. BNP (last 3 results) No results for input(s): PROBNP in the last 8760 hours. CBG: No results for  input(s): GLUCAP in the last 168 hours. D-Dimer: No results for input(s): DDIMER in the last 72 hours. Hgb A1c: No results for input(s): HGBA1C in the last 72 hours. Lipid Profile: No results for input(s): CHOL, HDL, LDLCALC, TRIG, CHOLHDL, LDLDIRECT in the last 72 hours. Thyroid function studies: No results for input(s): TSH, T4TOTAL, T3FREE, THYROIDAB in the last 72 hours.  Invalid input(s): FREET3 Anemia work up: No results for input(s): VITAMINB12, FOLATE, FERRITIN, TIBC, IRON, RETICCTPCT in the last 72 hours. Sepsis Labs: Recent Labs  Lab 06/19/20 0349 06/20/20 0733 06/21/20 0651  WBC 4.0 4.6 5.7    Microbiology Recent Results (from the past 240 hour(s))  CULTURE, BLOOD (ROUTINE X 2) w Reflex to ID Panel     Status: None   Collection Time: 06/15/20  5:59 PM   Specimen: BLOOD  Result Value Ref Range Status   Specimen Description BLOOD BLOOD LEFT HAND  Final  Special Requests   Final    BOTTLES DRAWN AEROBIC AND ANAEROBIC Blood Culture results may not be optimal due to an inadequate volume of blood received in culture bottles   Culture   Final    NO GROWTH 5 DAYS Performed at [facility], [address] [place], [place] [address]    Report Status 06/20/2020 FINAL  Final  CULTURE, BLOOD (ROUTINE X 2) w Reflex to ID Panel     Status: None   Collection Time: 06/15/20  6:01 PM   Specimen: BLOOD  Result Value Ref Range Status   Specimen Description BLOOD BLOOD RIGHT HAND  Final   Special Requests   Final    BOTTLES DRAWN AEROBIC ONLY Blood Culture results may not be optimal due to an inadequate volume of blood received in culture bottles   Culture   Final    NO GROWTH 5 DAYS Performed at [facility], [address] [place], [place] [address]    Report Status 06/20/2020 FINAL  Final    Procedures and diagnostic studies:  No results found.             LOS: 17 days   Khamari Yousuf  [employer] Chartered loss adjuster on [email or web address]  7PM-7AM, please contact night-coverage at [email or web address]     06/25/2020, 1:35 PM

## 2020-06-25 NOTE — Progress Notes (Signed)
Occupational Therapy Treatment Patient Details Name: [name] [name] MRN: [id number] DOB: 1960/09/17 Today's Date: 06/25/2020    History of present illness Pt is a 60 y.o. male with medical history significant for morbid obesity, chronic hypoxic respiratory failure, obesity hypoventilation syndrome, hypertension, depression, anxiety, alcohol use disorder, Left ear deafness since the age of 43, COPD, history of nonischemic cardiomyopathy, heart failure reduced ejection fraction, persistent atrial fibrillation, sleep apnea, history of DVT, GERD, hypothyroid, hyperlipidemia, history of bilateral segmental and subsegmental pulmonary emboli, chronic bilateral lower extremity venous stasis, presents to the emergency department for chief concerns of having a fall.  MD assessment includes: SIRS, acute on chronic respiratory failure, A-fib with RVR, alcohol withdrawal, R 6th rib Fx, and FTT.   OT comments  Pt seen for OT treatment this date to f/u re: safety with ADLs/ADL mobility. Goals updated this date to reflect pt progress as well as edit for addition of AE as needed as pt with some limited ROM and limitations with LB ADLs d/t body habitus at baseline. Pt reluctant initially, but with encouragement and increased time, agreeable to getting OOB. Pt requires CGA from elevated surface to SPS with hand held assist from bed to chair (EOB elevated ~3"). OT also has pt demo ability to STS from chair using arm rests with CGA (OT added two pillows to seat to elevate). Pt left in chair with all needs met and in reach. RN and CNA aware of pt status. Will continue to follow. Pt progressing, but still requiring more assist than is adequate to safely return to home living. Continue to recommend SNF to improve strength and tolerance.    Follow Up Recommendations  SNF    Equipment Recommendations  Other (comment) (defer)    Recommendations for Other Services      Precautions / Restrictions Precautions Precautions:  Fall Restrictions Weight Bearing Restrictions: No       Mobility Bed Mobility Overal bed mobility: Needs Assistance Bed Mobility: Supine to Sit     Supine to sit: Min assist;HOB elevated     General bed mobility comments: increased time    Transfers Overall transfer level: Needs assistance Equipment used: 1 person hand held assist Transfers: Sit to/from Stand;Stand Pivot Transfers Sit to Stand: Min guard;From elevated surface Stand pivot transfers: Min guard;From elevated surface       General transfer comment: 2 pillows placed in pt's chair to elevate seated surface to improve pt ability to STS from chair and pt demos ability rto CTS from recliner with CGA. Pt requires CGA to SPS from EOB to recliner (EOB elevated ~3"). RN notified    Balance Overall balance assessment: Needs assistance Sitting-balance support: No upper extremity supported;Feet supported Sitting balance-Leahy Scale: Good Sitting balance - Comments: G static sitting   Standing balance support: Single extremity supported Standing balance-Leahy Scale: Fair Standing balance comment: HHA with pt reaching for arm rests of chair.                           ADL either performed or assessed with clinical judgement   ADL                                               Vision Patient Visual Report: No change from baseline     Perception     Praxis  Cognition Arousal/Alertness: Awake/alert Behavior During Therapy: WFL for tasks assessed/performed Overall Cognitive Status: Within Functional Limits for tasks assessed                                          Exercises     Shoulder Instructions       General Comments      Pertinent Vitals/ Pain       Pain Assessment: Faces Faces Pain Scale: Hurts a little bit Pain Location: R foot and knee Pain Descriptors / Indicators: Sore Pain Intervention(s): Monitored during session  Home Living                                           Prior Functioning/Environment              Frequency  Min 2X/week        Progress Toward Goals  OT Goals(current goals can now be found in the care plan section)  Progress towards OT goals: Progressing toward goals;Goals met and updated - see care plan (some goals updated to reflect progress, some edited to include AE use)  Acute Rehab OT Goals Patient Stated Goal: to be able to get strong enough to get myself out of the bed OT Goal Formulation: With patient Time For Goal Achievement: 07/09/20 Potential to Achieve Goals: Good ADL Goals Pt Will Perform Lower Body Dressing: with min guard assist;with min assist;sitting/lateral leans (with AE) Pt Will Transfer to Toilet: with min guard assist;stand pivot transfer;bedside commode Pt Will Perform Toileting - Clothing Manipulation and hygiene: with min guard assist;with adaptive equipment (bottom buddy)  Plan Discharge plan remains appropriate    Co-evaluation                 AM-PAC OT "6 Clicks" Daily Activity     Outcome Measure   Help from another person eating meals?: A Little Help from another person taking care of personal grooming?: A Little Help from another person toileting, which includes using toliet, bedpan, or urinal?: Total Help from another person bathing (including washing, rinsing, drying)?: A Lot Help from another person to put on and taking off regular upper body clothing?: A Little Help from another person to put on and taking off regular lower body clothing?: A Lot 6 Click Score: 14    End of Session Equipment Utilized During Treatment: Oxygen  OT Visit Diagnosis: Unsteadiness on feet (R26.81);Repeated falls (R29.6);Muscle weakness (generalized) (M62.81)   Activity Tolerance Patient limited by fatigue;Patient limited by pain;Other (comment) (somewhat self limiting)   Patient Left in chair;with call bell/phone within reach   Nurse  Communication Mobility status;Other (comment) (notified CNA of need for purewick replaced)        Time: 8250-0370 OT Time Calculation (min): 26 min  Charges: OT General Charges $OT Visit: 1 Visit OT Treatments $Therapeutic Activity: 23-37 mins  Gerrianne Scale, [place], OTR/L ascom [phone] 06/25/20, 5:58 PM

## 2020-06-25 NOTE — Progress Notes (Signed)
Pt refused dressing change. Pt request that it is done later today

## 2020-06-26 DIAGNOSIS — I4891 Unspecified atrial fibrillation: Secondary | ICD-10-CM | POA: Diagnosis not present

## 2020-06-26 DIAGNOSIS — J432 Centrilobular emphysema: Secondary | ICD-10-CM | POA: Diagnosis not present

## 2020-06-26 DIAGNOSIS — Z789 Other specified health status: Secondary | ICD-10-CM | POA: Diagnosis not present

## 2020-06-26 LAB — PHOSPHORUS: Phosphorus: 4.4 mg/dL (ref 2.5–4.6)

## 2020-06-26 LAB — MAGNESIUM: Magnesium: 1.9 mg/dL (ref 1.7–2.4)

## 2020-06-26 NOTE — Consult Note (Addendum)
WOC Nurse Consult Note: Pt requests that Una boots be changed today instead of Friday as previously scheduled.  Removed Una boots; no open wounds or drainage.  Previously noted generalized edema has greatly improved.  Legs remain with darker-colored skin with chronic venous stasis changes.  Previously noted intertrigo to abd and groin skin folds has resolved, previously noted partial thickness abrasions to bilat ischium/posterior thighs has resolved. Pink dry skin to locations. Dressing procedure/placement/frequency: Applied bilat Una boots and coban.  WOC will plan to change dressings to legs next Thursday if he is still in the hospital at that time. Cammie Mcgee MSN, RN, CWOCN, [place], CNS [phone]

## 2020-06-26 NOTE — Progress Notes (Signed)
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  Pt qadmitted with acute on CRF, ETOH withdrawl, AF c RVR. Rt shoulder MRI done this admission 5/7 revealing "large full-thickness rotator cuff tear with  full-thickness retracted insertional tears of the supraspinatus and  infraspinatus tendons. Both tendons are retracted nearly to the  level of the glenoid. The subscapularis and teres minor tendons  appear intact".    PT Comments    Pt in recliner upon entry, reports no assist needed to get there earlier in day. Pt reports soreness all over, most of which is chronic/baseline. Pt takes part in chair level exercises, good response overall, resolution of right foot numbness to right foot pain. Pt demonstrates much improved in capacity to perform transfers with independence and safety, provided pt with BRW, which he recognizes and calls 'ugly as...', but not opposed to using. Some assist required for AA/ROM due to pain. Methocarbamol in blister packets found near computer, RN made aware.    Follow Up Recommendations  SNF;Supervision/Assistance - 24 hour     Equipment Recommendations  Rolling walker with 5" wheels    Recommendations for Other Services       Precautions / Restrictions Precautions Precautions: Fall Restrictions Weight Bearing Restrictions: No    Mobility  Bed Mobility               General bed mobility comments: in recliner upon entry    Transfers Overall transfer level: Needs assistance Equipment used: Rolling walker (2 wheeled);None (bariatric RW) Transfers: Sit  to/from Stand Sit to Stand: From elevated surface;Supervision         General transfer comment: from recliner + pillows: 5x in session c 15-20sec standing each time, standing exacerbates low back pain to point of needing to rest elbows on RW almost immediately each time.  Ambulation/Gait Ambulation/Gait assistance:  (deferred)               Stairs             Wheelchair Mobility    Modified Rankin (Stroke Patients Only)       Balance Overall balance assessment: Modified Independent;No apparent balance deficits (not formally assessed);History of Falls                                          Cognition Arousal/Alertness: Awake/alert Behavior During Therapy: WFL for tasks assessed/performed Overall Cognitive Status: Within Functional Limits for tasks assessed                                        Exercises Other Exercises Other Exercises: Heel raises seated 2x15, LAQ seated 2x10 bilat, marching in chair 1x10 bilat author assisted. Other Exercises: STS from chair + BRW 5x1x20sec, supervision level    General Comments        Pertinent Vitals/Pain Pain Assessment: 0-10 Pain Score: 10-Worst pain ever Pain Location: Rt foot, Rt shoulder, low back, neck, Right hip,  chest. Pain Descriptors / Indicators: Sore Pain Intervention(s): Limited activity within patient's tolerance;Monitored during session    Home Living                      Prior Function            PT Goals (current goals can now be found in the care plan section) Acute Rehab PT Goals Patient Stated Goal: to be able to get strong enough to get myself out of the bed PT Goal Formulation: With patient Time For Goal Achievement: 07/10/20 Potential to Achieve Goals: Fair Progress towards PT goals: Progressing toward goals    Frequency    Min 2X/week      PT Plan Current plan remains appropriate    Co-evaluation              AM-PAC PT "6  Clicks" Mobility   Outcome Measure  Help needed turning from your back to your side while in a flat bed without using bedrails?: A Little Help needed moving from lying on your back to sitting on the side of a flat bed without using bedrails?: A Little Help needed moving to and from a bed to a chair (including a wheelchair)?: A Little Help needed standing up from a chair using your arms (e.g., wheelchair or bedside chair)?: A Little Help needed to walk in hospital room?: A Lot Help needed climbing 3-5 steps with a railing? : A Lot 6 Click Score: 16    End of Session Equipment Utilized During Treatment: Oxygen Activity Tolerance: Patient tolerated treatment well;Patient limited by fatigue;Patient limited by pain Patient left: with call bell/phone within reach;in chair Nurse Communication: Mobility status;Other (comment) (methocarbamol found in room 2x500mg  still in blister pack, not in reach of patient, unclear if RN forget to issue?) PT Visit Diagnosis: Muscle weakness (generalized) (M62.81);Difficulty in walking, not elsewhere classified (R26.2)     Time: 2355-7322 PT Time Calculation (min) (ACUTE ONLY): 24 min  Charges:  $Therapeutic Exercise: 23-37 mins                     3:46 PM, 06/26/20 Rosamaria Lints, PT, DPT Physical Therapist - [facility]  [phone] (ASCOM)    Macil Crady C 06/26/2020, 3:41 PM

## 2020-06-26 NOTE — TOC Progression Note (Addendum)
Transition of Care [facility]) - Progression Note    Patient Details  Name: [name] [name] MRN: [id number] Date of Birth: 1960-12-14  Transition of Care [facility]) CM/SW [place], LCSW Phone Number: 06/26/2020, 9:04 AM  Clinical Narrative:  Left message for [employer] representative for update on [facility] and [employer]. Had left voicemail for DSS social worker yesterday afternoon regarding [employer]. Still awaiting call back.  10:53 am: [employer] representative still needs to talk with her supervisor about financial piece.  2:20 pm: Met with patient and provided bed offers. Patient prefers [place] because it is closest to [employer]. Left voicemail for DSS social worker to notify. Sent [facility] an updated FL2 to include bipap settings.  Expected Discharge Plan: Skilled Nursing Facility Barriers to Discharge: Continued Medical Work up  Expected Discharge Plan and Services Expected Discharge Plan: [place] Choice: [place] arrangements for the past 2 months: Apartment                   DME Agency: [place] Date DME Agency Contacted: 06/12/20 Time DME Agency Contacted: [phone] Representative spoke with at DME Agency: Meredeth [employer] reporesentative stated they woudl not continue services for the patient due to intoxication.             Social Determinants of Health (SDOH) Interventions    Readmission Risk Interventions Readmission Risk Prevention Plan 01/11/2020 12/24/2019 01/25/2019  Transportation Screening Complete Complete Complete  PCP or Specialist Appt within 3-5 Days - - -  HRI or [place] for [place] - - -  Medication Review [employer]) Complete Complete Complete  PCP or Specialist appointment within 3-5 days of discharge Complete Complete Complete  HRI or [place] - - Complete  SW Recovery Care/Counseling Consult Complete Complete Complete  Palliative Care Screening Complete Not Applicable Not [place] Not Applicable Not Applicable Not Applicable

## 2020-06-26 NOTE — Progress Notes (Addendum)
Progress Note    [name] [name]  VXB:[id number] DOB: 05-04-1960  DOA: 06/08/2020 PCP: [name], DO      Brief Narrative:    Medical records reviewed and are as summarized below:  [name] [name] is a 60 y.o. male with medical history significant formorbid obesity, chronic hypoxic respiratory failure on 3 L/min oxygen via nasal cannula, OHS, OSA, hypertension, depression, anxiety, alcohol use,Left ear deafnesssince the age of 58, COPD, and ICM, systolic CHF, persistent A. fib, history of DVT and PE, GERD, hypothyroid, hyperlipidemia, history of PE, chronic bilateral lower extremity venous stasis.  He was brought to the hospital after a fall at home.  He was admitted to the hospital for acute on chronic respiratory failure secondary to COPD exacerbation and CHF exacerbation.  He was also found to have right sixth rib fracture likely from fall at home.  He was treated with steroids, bronchodilators and IV Lasix.  He also developed atrial fibrillation with RVR requiring IV Cardizem infusion.  He was treated with Ativan for alcohol withdrawal syndrome.  Hospital course was complicated by acute gout and possible lower extremity cellulitis.  He was treated with IV antibiotics, steroids, colchicine and allopurinol.  He was found to have full-thickness rotator cuff tear of the right shoulder.  Orthopedic surgery was consulted and his right shoulder was injected with Xylocaine and Kenalog.  Outpatient follow-up with orthopedic surgeon was recommended.    Assessment/Plan:   Active Problems:   Centrilobular emphysema (HCC)   Sepsis (HCC)   Chronic atrial fibrillation (HCC)   HTN (hypertension)   Chronic gout involving toe, unspecified cause, unspecified laterality   TBI (traumatic brain injury) (HCC)   Personal history of DVT (deep vein thrombosis)   Atrial fibrillation with RVR (HCC)   Major depressive disorder, recurrent episode, in partial remission  (HCC)   HLD (hyperlipidemia)   Depression   Pressure ulcer   Alcohol abuse   Alcohol withdrawal syndrome without complication (HCC)   GAD (generalized anxiety disorder)   COPD (chronic obstructive pulmonary disease) (HCC)   Atrial fibrillation, chronic (HCC)   Class 3 obesity with alveolar hypoventilation, serious comorbidity, and body mass index (BMI) of 60.0 to 69.9 in adult [facility])   CHF (congestive heart failure) (HCC)   Weakness of both lower extremities   Physical deconditioning   Elevated lactic acid level   Unable to care for self   Nutrition Problem: Increased nutrient needs Etiology: wound healing  Signs/Symptoms: estimated needs   Body mass index is 58.81 kg/m.  (Morbid obesity)   Acute on chronic systolic CHF: Improved.  Continue Lasix.  2D echo on 04/07/2020 showed EF estimated at 30 to 35%.  Acute on chronic hypoxic respiratory failure, OSA, OHS, COPD: Continue bronchodilators, steroids, 3L/min oxygen during the day and BiPAP at night.  Acute gout involving bilateral feet and wrists: Completed 7 days of prednisone.  Continue colchicine and allopurinol.  Right shoulder pain, right rotator cuff tear, right sixth rib fracture: Analgesics as needed for pain.  Right shoulder was injected with Xylocaine and Kenalog on 06/22/2020.  Outpatient follow-up with orthopedic surgeon.  Failure to thrive: Patient is unable to care for himself at home.  DSS is involved.  Awaiting placement to SNF.  Stage II decubitus ulcers on the right thigh, left posterior thigh and sacrum (present on admission): Continue local wound care.  Persistent atrial fibrillation: Continue metoprolol and Eliquis  Cellulitis of bilateral lower extremities, chronic venous stasis, Aerococcus bacteriuria: Completed antibiotics.  Other comorbidities include hypertension, depression, hypothyroidism, alcohol use disorder  Awaiting placement to SNF.  I asked Maralyn Sago, Child psychotherapist to reach out to him.  Diet  Order            Diet Carb Modified Fluid consistency: Thin; Room service appropriate? Yes  Diet effective now                    Consultants:  Orthopedic surgeon  Procedures:  None    Medications:   . allopurinol  100 mg Oral Daily  . apixaban  5 mg Oral BID  . vitamin C  500 mg Oral BID  . aspirin  81 mg Oral Daily  . atorvastatin  40 mg Oral Daily  . benzonatate  100 mg Oral TID  . colchicine  0.6 mg Oral Daily  . DULoxetine  20 mg Oral Daily  . furosemide  60 mg Oral Daily  . levothyroxine  75 mcg Oral QAC breakfast  . lisinopril  2.5 mg Oral Daily  . methocarbamol  750 mg Oral TID  . metoprolol tartrate  100 mg Oral BID  . montelukast  10 mg Oral QHS  . multivitamin with minerals  1 tablet Oral Daily  . polyethylene glycol  17 g Oral Daily  . Ensure Max Protein  11 oz Oral TID WC  . thiamine  100 mg Oral Daily   Or  . thiamine  100 mg Intravenous Daily   Continuous Infusions:   Anti-infectives (From admission, onward)   Start     Dose/Rate Route Frequency Ordered Stop   06/16/20 1000  nitrofurantoin (macrocrystal-monohydrate) (MACROBID) capsule 100 mg        100 mg Oral Every 12 hours 06/16/20 0843 06/16/20 2209   06/16/20 1000  cefTRIAXone (ROCEPHIN) 2 g in sodium chloride 0.9 % 100 mL IVPB        2 g 200 mL/hr over 30 Minutes Intravenous Every 24 hours 06/16/20 0843 06/20/20 1204   06/11/20 1200  nitrofurantoin (macrocrystal-monohydrate) (MACROBID) capsule 100 mg  Status:  Discontinued        100 mg Oral Every 12 hours 06/11/20 1026 06/16/20 0843   06/09/20 0300  vancomycin (VANCOREADY) IVPB 1750 mg/350 mL  Status:  Discontinued        1,750 mg 175 mL/hr over 120 Minutes Intravenous Every 12 hours 06/08/20 1610 06/09/20 0800   06/08/20 2200  metroNIDAZOLE (FLAGYL) IVPB 500 mg  Status:  Discontinued       Note to Pharmacy: He got one dose in the ED already. Please schedule appropriately.   500 mg 100 mL/hr over 60 Minutes Intravenous Every 8 hours  06/08/20 1436 06/09/20 0800   06/08/20 2000  ceFEPIme (MAXIPIME) 2 g in sodium chloride 0.9 % 100 mL IVPB  Status:  Discontinued        2 g 200 mL/hr over 30 Minutes Intravenous Every 8 hours 06/08/20 1447 06/09/20 0800   06/08/20 1445  metroNIDAZOLE (FLAGYL) IVPB 500 mg  Status:  Discontinued       Note to Pharmacy: He got one dose in the ED already. Please schedule appropriately.   500 mg 100 mL/hr over 60 Minutes Intravenous Every 8 hours 06/08/20 1434 06/08/20 1436   06/08/20 1445  vancomycin (VANCOCIN) IVPB 1000 mg/200 mL premix       "Followed by" Linked Group Details   1,000 mg 200 mL/hr over 60 Minutes Intravenous  Once 06/08/20 1441 06/08/20 1633   06/08/20 1445  vancomycin (VANCOREADY)  IVPB 1500 mg/300 mL  Status:  Discontinued       "Followed by" Linked Group Details   1,500 mg 150 mL/hr over 120 Minutes Intravenous  Once 06/08/20 1441 06/09/20 0717   06/08/20 1100  ceFEPIme (MAXIPIME) 2 g in sodium chloride 0.9 % 100 mL IVPB        2 g 200 mL/hr over 30 Minutes Intravenous  Once 06/08/20 1047 06/08/20 1331   06/08/20 1100  metroNIDAZOLE (FLAGYL) IVPB 500 mg        500 mg 100 mL/hr over 60 Minutes Intravenous  Once 06/08/20 1047 06/08/20 1529   06/08/20 1100  vancomycin (VANCOCIN) IVPB 1000 mg/200 mL premix  Status:  Discontinued        1,000 mg 200 mL/hr over 60 Minutes Intravenous  Once 06/08/20 1047 06/08/20 1441             Family Communication/Anticipated D/C date and plan/Code Status   DVT prophylaxis:  apixaban (ELIQUIS) tablet 5 mg     Code Status: DNR  Family Communication: None Disposition Plan:    Status is: Inpatient  Remains inpatient appropriate because:Inpatient level of care appropriate due to severity of illness   Dispo: The patient is from: Home              Anticipated d/c is to: SNF              Patient currently is medically stable to d/c.   Difficult to place patient Yes           Subjective:   Interval events noted.   He is frustrated about having to wait too long for nursing home placement.  Objective:    Vitals:   06/25/20 1647 06/25/20 2231 06/26/20 0442 06/26/20 0847  BP: 111/72 107/62 (!) 114/45 138/87  Pulse: 75 78 (!) 55 68  Resp: 17 16 20 18   Temp: 98 F (36.7 C) 97.6 F (36.4 C) 97.9 F (36.6 C) 98.2 F (36.8 C)  TempSrc: Oral Axillary Oral Oral  SpO2: 98% 98% 99% 100%  Weight:      Height:       No data found.   Intake/Output Summary (Last 24 hours) at 06/26/2020 1551 Last data filed at 06/26/2020 1315 Gross per 24 hour  Intake 0 ml  Output 2300 ml  Net -2300 ml   Filed Weights   06/22/20 0500 06/23/20 0538 06/24/20 0414  Weight: (!) 179.2 kg (!) 178.5 kg (!) 175.5 kg    Exam:  GEN: NAD SKIN: Warm and dry EYES: EOMI ENT: MMM CV: RRR PULM: CTA B ABD: soft, obese, NT, +BS CNS: AAO x 3, non focal EXT: No edema or tenderness        Data Reviewed:   I have personally reviewed following labs and imaging studies:  Labs: Labs show the following:   Basic Metabolic Panel: Recent Labs  Lab 06/20/20 0733 06/21/20 0651 06/22/20 0502 06/23/20 0611 06/23/20 1829 06/24/20 0353 06/25/20 0400 06/26/20 0342  NA 136 136  --   --  133* 138 137  --   K 4.0 4.4  --   --  5.3* 4.8 4.5  --   CL 85* 87*  --   --  92* 96* 95*  --   CO2 43* 40*  --   --  32 33* 36*  --   GLUCOSE 169* 98  --   --  209* 148* 130*  --   BUN 26* 25*  --   --  29* 28* 30*  --   CREATININE 0.67 0.69  --   --  0.70 0.66 0.73  --   CALCIUM 8.9 8.6*  --   --  8.6* 8.9 8.9  --   MG 1.8 1.9 2.0 2.2  --  2.0 2.0 1.9  PHOS 3.0 4.0 4.0 4.0  --  4.4 5.8* 4.4   GFR Estimated Creatinine Clearance: 156.4 mL/min (by C-G formula based on SCr of 0.73 mg/dL). Liver Function Tests: No results for input(s): AST, ALT, ALKPHOS, BILITOT, PROT, ALBUMIN in the last 168 hours. No results for input(s): LIPASE, AMYLASE in the last 168 hours. No results for input(s): AMMONIA in the last 168 hours. Coagulation  profile No results for input(s): INR, PROTIME in the last 168 hours.  CBC: Recent Labs  Lab 06/20/20 0733 06/21/20 0651  WBC 4.6 5.7  HGB 11.1* 11.3*  HCT 34.9* 36.0*  MCV 99.4 101.1*  PLT 319 297   Cardiac Enzymes: No results for input(s): CKTOTAL, CKMB, CKMBINDEX, TROPONINI in the last 168 hours. BNP (last 3 results) No results for input(s): PROBNP in the last 8760 hours. CBG: No results for input(s): GLUCAP in the last 168 hours. D-Dimer: No results for input(s): DDIMER in the last 72 hours. Hgb A1c: No results for input(s): HGBA1C in the last 72 hours. Lipid Profile: No results for input(s): CHOL, HDL, LDLCALC, TRIG, CHOLHDL, LDLDIRECT in the last 72 hours. Thyroid function studies: No results for input(s): TSH, T4TOTAL, T3FREE, THYROIDAB in the last 72 hours.  Invalid input(s): FREET3 Anemia work up: No results for input(s): VITAMINB12, FOLATE, FERRITIN, TIBC, IRON, RETICCTPCT in the last 72 hours. Sepsis Labs: Recent Labs  Lab 06/20/20 0733 06/21/20 0651  WBC 4.6 5.7    Microbiology No results found for this or any previous visit (from the past 240 hour(s)).  Procedures and diagnostic studies:  No results found.             LOS: 18 days   Huy Majid  [employer] Chartered loss adjuster on [email or web address]. If 7PM-7AM, please contact night-coverage at [email or web address]     06/26/2020, 3:51 PM

## 2020-06-26 NOTE — Progress Notes (Signed)
Nutrition Follow-up  DOCUMENTATION CODES:   Morbid obesity  INTERVENTION:   Ensure Max protein supplement TID, each supplement provides 150kcal and 30g of protein.  Magic cup TID with meals, each supplement provides 290 kcal and 9 grams of protein  MVI po daily   Vitamin C 517m po BID  NUTRITION DIAGNOSIS:   Increased nutrient needs related to wound healing as evidenced by increased estimated needs.  GOAL:   Patient will meet greater than or equal to 90% of their needs  -met   MONITOR:   PO intake,Supplement acceptance,Labs,Weight trends,Skin,I & O's  ASSESSMENT:   60y.o. male with medical history significant for morbid obesity, chronic hypoxic respiratory failure, obesity hypoventilation syndrome, hypertension, depression, anxiety, alcohol use disorder, left ear deafness since the age of 89 COPD, history of nonischemic cardiomyopathy, heart failure reduced ejection fraction, persistent atrial fibrillation, sleep apnea, DVT, GERD, hypothyroid, hyperlipidemia, bilateral segmental and subsegmental pulmonary emboli and chronic bilateral lower extremity venous stasis who presents to the emergency department for chief concerns of having a fall  Pt continues to have good appetite and oral intake; pt eating 100% of meals in hospital and is drinking Ensure supplements. Refeed labs stable. Per chart, pt is weight stable since admit. SNF placement pending.   Medications reviewed and include: allopurinol, vitamin C, aspirin, lasix, synthroid, MVI, miralax, thiamine  Labs reviewed: K 4.5 wnl, BUN 30(H)- 5/11 P 4.4 wnl, Mg 1.9 wnl  Diet Order:   Diet Order            Diet Carb Modified Fluid consistency: Thin; Room service appropriate? Yes  Diet effective now                EDUCATION NEEDS:   Education needs have been addressed  Skin:  Skin Assessment: Skin Integrity Issues: Skin Integrity Issues:: Stage II,Other (Comment) Stage II: rt and lt thighs Other: ITD at sub  pannicilar and bilateral inguina skin folds; full and partial thickness chronic friction skin injuries at bilateral ischial tuberosity  Last BM:  5/11- type 3  Height:   Ht Readings from Last 1 Encounters:  06/08/20 5' 8"  (1.727 m)    Weight:   Wt Readings from Last 1 Encounters:  06/24/20 (!) 175.5 kg    Ideal Body Weight:  70 kg  BMI:  Body mass index is 58.81 kg/m.  Estimated Nutritional Needs:   Kcal:  3000-3300kcal/day  Protein:  140-150g/day  Fluid:  2.2-2.5L/day  CKoleen DistanceMS, RD, LDN Please refer to AProgressive Surgical Institute Incfor RD and/or RD on-call/weekend/after hours pager
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
## 2020-06-27 DIAGNOSIS — I5032 Chronic diastolic (congestive) heart failure: Secondary | ICD-10-CM
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
## 2020-06-27 NOTE — Progress Notes (Signed)
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  Pt qadmitted with acute on CRF, ETOH withdrawl, AF c RVR. Rt shoulder MRI done this admission 5/7 revealing "large full-thickness rotator cuff tear with  full-thickness retracted insertional tears of the supraspinatus and  infraspinatus tendons. Both tendons are retracted nearly to the  level of the glenoid. The subscapularis and teres minor tendons  appear intact".   OT comments  Pt seen for OT tx this date to f/u re: safety with ADLs/ADL mobility. Pt requires SETUP to complete 2 seated g/h tasks including oral care. SETUP required as pt does not have fxl activity tolerance to walk to gather ADL items himself. Ed provided re: importance of oral care r/t infection prevention and pt with good understanding. In addition, pt with one STS trial with CGA from chair with 2 pillows to elevate seat height. Pt declines further standing trials at this time citing increased joint pain d/t the "barometric pressure" 2/2 rain and humidity outside. Will continue to follow acutely. Continue to recommend STR as pt still requires assist in most aspects of self care and mobility and lacks the activity tolerance and social support to complete safely in the home environment at this time.    Follow Up Recommendations  SNF    Equipment Recommendations  Other (comment) (defer)    Recommendations for Other Services      Precautions / Restrictions Precautions Precautions: Fall Restrictions Weight Bearing Restrictions: No       Mobility Bed Mobility                General bed mobility comments: in recliner upon entry    Transfers Overall transfer level: Needs assistance Equipment used: None Transfers: Sit to/from Stand Sit to Stand: Min guard;From elevated surface         General transfer comment: 2 pillows in chair to increase seat height    Balance Overall balance assessment: History of Falls Sitting-balance support: Feet supported Sitting balance-Leahy Scale: Good Sitting balance - Comments: G static sitting   Standing balance support: Single extremity supported Standing balance-Leahy Scale: Fair Standing balance comment: HHA                           ADL either performed or assessed with clinical judgement   ADL Overall ADL's : Needs assistance/impaired     Grooming: Wash/dry face;Oral care;Set up;Sitting                                       Vision Patient Visual Report: No change from baseline     Perception     Praxis      Cognition Arousal/Alertness: Awake/alert Behavior During Therapy: WFL for tasks assessed/performed Overall Cognitive Status: Within Functional Limits for tasks assessed                                 General Comments: Pt A and O x 4  Exercises     Shoulder Instructions       General Comments      Pertinent Vitals/ Pain       Pain Assessment: Faces Faces Pain Scale: Hurts little more Pain Location: Rt foot, Rt shoulder, low back, neck, Right hip, chest. Pain Descriptors / Indicators: Sore Pain Intervention(s): Limited activity within patient's tolerance;Monitored during session  Home Living                                          Prior Functioning/Environment              Frequency  Min 2X/week        Progress Toward Goals  OT Goals(current goals can now be found in the care plan section)  Progress towards OT goals: Progressing toward goals  Acute Rehab OT Goals Patient Stated Goal: to be  able to get strong enough to get myself out of the bed OT Goal Formulation: With patient Time For Goal Achievement: 07/09/20 Potential to Achieve Goals: Good  Plan Discharge plan remains appropriate    Co-evaluation                 AM-PAC OT "6 Clicks" Daily Activity     Outcome Measure   Help from another person eating meals?: A Little Help from another person taking care of personal grooming?: A Little Help from another person toileting, which includes using toliet, bedpan, or urinal?: Total Help from another person bathing (including washing, rinsing, drying)?: A Lot Help from another person to put on and taking off regular upper body clothing?: A Little Help from another person to put on and taking off regular lower body clothing?: A Lot 6 Click Score: 14    End of Session Equipment Utilized During Treatment: Oxygen  OT Visit Diagnosis: Unsteadiness on feet (R26.81);Repeated falls (R29.6);Muscle weakness (generalized) (M62.81)   Activity Tolerance Patient limited by fatigue;Patient limited by pain;Other (comment)   Patient Left in chair;with call bell/phone within reach   Nurse Communication          Time: 7867-6720 OT Time Calculation (min): 11 min  Charges: OT General Charges $OT Visit: 1 Visit OT Treatments $Self Care/Home Management : 8-22 mins  Rejeana Brock, MS, OTR/L ascom [phone] 06/27/20, 4:49 PM

## 2020-06-27 NOTE — Progress Notes (Signed)
Mobility Specialist - Progress Note   06/27/20 1213  Mobility  Activity Transferred:  Bed to chair  Level of Assistance Standby assist, set-up cues, supervision of patient - no hands on  Assistive Device None  Distance Ambulated (ft) 2 ft  Mobility Out of bed to chair with meals  Mobility Response Tolerated well  Mobility performed by Mobility specialist  $Mobility charge 1 Mobility    Pt transferred from bed-chair. Pt refused use of RW, relied on bed rails and chair arm for SPT. Sat EOB with supervision and extra time. Voiced mild SOB with transfer, O2 briefly desat to 85% on 3L but rebounded to 93% quickly with PLB.    [name] [name] Mobility Specialist 06/27/20, 12:17 PM

## 2020-06-27 NOTE — Progress Notes (Signed)
Progress Note    [name] [name]  DPO:[id number] DOB: 02/01/1961  DOA: 06/08/2020 PCP: [name], DO      Brief Narrative:    Medical records reviewed and are as summarized below:  [name] [name] is a 60 y.o. 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 fib, history of DVT and PE, GERD, hypothyroid, hyperlipidemia, history of PE, chronic bilateral lower extremity venous stasis.  He was brought to the hospital after a fall at home.  He was admitted to the hospital for acute on chronic respiratory failure secondary to COPD exacerbation and CHF exacerbation.  He was also found to have right sixth rib fracture likely from fall at home.  He was treated with steroids, bronchodilators and IV Lasix.  He also developed atrial fibrillation with RVR requiring IV Cardizem infusion.  He was treated with Ativan for alcohol withdrawal syndrome.  Hospital course was complicated by acute gout and possible lower extremity cellulitis.  He was treated with IV antibiotics, steroids, colchicine and allopurinol.  He was found to have full-thickness rotator cuff tear of the right shoulder.  Orthopedic surgery was consulted and his right shoulder was injected with Xylocaine and Kenalog.  Outpatient follow-up with orthopedic surgeon was recommended.    Assessment/Plan:   Active Problems:   Centrilobular emphysema (HCC)   Sepsis (HCC)   Chronic atrial fibrillation (HCC)   HTN (hypertension)   Chronic gout involving toe, unspecified cause, unspecified laterality   TBI (traumatic brain injury) (HCC)   Personal history of DVT (deep vein thrombosis)   Atrial fibrillation with RVR (HCC)   Major depressive disorder, recurrent episode, in partial remission  (HCC)   HLD (hyperlipidemia)   Depression   Pressure ulcer   Alcohol abuse   Alcohol withdrawal syndrome without complication (HCC)   GAD (generalized anxiety disorder)   COPD (chronic obstructive pulmonary disease) (HCC)   Atrial fibrillation, chronic (HCC)   Class 3 obesity with alveolar hypoventilation, serious comorbidity, and body mass index (BMI) of 60.0 to 69.9 in adult [facility])   CHF (congestive heart failure) (HCC)   Weakness of both lower extremities   Physical deconditioning   Elevated lactic acid level   Unable to care for self   Nutrition Problem: Increased nutrient needs Etiology: wound healing  Signs/Symptoms: estimated needs   Body mass index is 58.81 kg/m.  (Morbid obesity)   Acute on chronic systolic CHF: Compensated.  Continue Lasix.  2D echo on 04/07/2020 showed EF estimated at 30 to 35%.  Acute on chronic hypoxic respiratory failure, OSA, OHS, COPD: Continue bronchodilators, steroids, 3L/min oxygen during the day and BiPAP at night.  Acute gout involving bilateral feet and wrists: Completed 7 days of prednisone.  Continue colchicine and allopurinol.  Right shoulder pain, right rotator cuff tear, right sixth rib fracture: Analgesics as needed for pain.  Right shoulder was injected with Xylocaine and Kenalog on 06/22/2020.  Outpatient follow-up with orthopedic surgeon.  Failure to thrive: Patient is unable to care for himself at home.  DSS is involved.  Awaiting placement to SNF.  Stage II decubitus ulcers on the right thigh, left posterior thigh and sacrum (present on admission): Continue local wound care.  Persistent atrial fibrillation: Continue metoprolol and Eliquis.  Cellulitis of bilateral lower extremities, chronic venous stasis, Aerococcus bacteriuria: Completed antibiotics.  Other comorbidities include hypertension, depression, hypothyroidism, alcohol use disorder  Awaiting placement to SNF.  Diet Order            Diet Carb Modified Fluid  consistency: Thin; Room service appropriate? Yes  Diet effective now                    Consultants:  Orthopedic surgeon  Procedures:  None    Medications:   . allopurinol  100 mg Oral Daily  . apixaban  5 mg Oral BID  . vitamin C  500 mg Oral BID  . aspirin  81 mg Oral Daily  . atorvastatin  40 mg Oral Daily  . benzonatate  100 mg Oral TID  . colchicine  0.6 mg Oral Daily  . DULoxetine  20 mg Oral Daily  . furosemide  60 mg Oral Daily  . levothyroxine  75 mcg Oral QAC breakfast  . lisinopril  2.5 mg Oral Daily  . methocarbamol  750 mg Oral TID  . metoprolol tartrate  100 mg Oral BID  . montelukast  10 mg Oral QHS  . multivitamin with minerals  1 tablet Oral Daily  . polyethylene glycol  17 g Oral Daily  . Ensure Max Protein  11 oz Oral TID WC  . thiamine  100 mg Oral Daily   Or  . thiamine  100 mg Intravenous Daily   Continuous Infusions:   Anti-infectives (From admission, onward)   Start     Dose/Rate Route Frequency Ordered Stop   06/16/20 1000  nitrofurantoin (macrocrystal-monohydrate) (MACROBID) capsule 100 mg        100 mg Oral Every 12 hours 06/16/20 0843 06/16/20 2209   06/16/20 1000  cefTRIAXone (ROCEPHIN) 2 g in sodium chloride 0.9 % 100 mL IVPB        2 g 200 mL/hr over 30 Minutes Intravenous Every 24 hours 06/16/20 0843 06/20/20 1204   06/11/20 1200  nitrofurantoin (macrocrystal-monohydrate) (MACROBID) capsule 100 mg  Status:  Discontinued        100 mg Oral Every 12 hours 06/11/20 1026 06/16/20 0843   06/09/20 0300  vancomycin (VANCOREADY) IVPB 1750 mg/350 mL  Status:  Discontinued        1,750 mg 175 mL/hr over 120 Minutes Intravenous Every 12 hours 06/08/20 1610 06/09/20 0800   06/08/20 2200  metroNIDAZOLE (FLAGYL) IVPB 500 mg  Status:  Discontinued       Note to Pharmacy: He got one dose in the ED already. Please schedule appropriately.   500 mg 100 mL/hr over 60 Minutes Intravenous Every 8 hours 06/08/20 1436 06/09/20 0800   06/08/20 2000   ceFEPIme (MAXIPIME) 2 g in sodium chloride 0.9 % 100 mL IVPB  Status:  Discontinued        2 g 200 mL/hr over 30 Minutes Intravenous Every 8 hours 06/08/20 1447 06/09/20 0800   06/08/20 1445  metroNIDAZOLE (FLAGYL) IVPB 500 mg  Status:  Discontinued       Note to Pharmacy: He got one dose in the ED already. Please schedule appropriately.   500 mg 100 mL/hr over 60 Minutes Intravenous Every 8 hours 06/08/20 1434 06/08/20 1436   06/08/20 1445  vancomycin (VANCOCIN) IVPB 1000 mg/200 mL premix       "Followed by" Linked Group Details   1,000 mg 200 mL/hr over 60 Minutes Intravenous  Once 06/08/20 1441 06/08/20 1633   06/08/20 1445  vancomycin (VANCOREADY) IVPB 1500 mg/300 mL  Status:  Discontinued       "  Followed by" Linked Group Details   1,500 mg 150 mL/hr over 120 Minutes Intravenous  Once 06/08/20 1441 06/09/20 0717   06/08/20 1100  ceFEPIme (MAXIPIME) 2 g in sodium chloride 0.9 % 100 mL IVPB        2 g 200 mL/hr over 30 Minutes Intravenous  Once 06/08/20 1047 06/08/20 1331   06/08/20 1100  metroNIDAZOLE (FLAGYL) IVPB 500 mg        500 mg 100 mL/hr over 60 Minutes Intravenous  Once 06/08/20 1047 06/08/20 1529   06/08/20 1100  vancomycin (VANCOCIN) IVPB 1000 mg/200 mL premix  Status:  Discontinued        1,000 mg 200 mL/hr over 60 Minutes Intravenous  Once 06/08/20 1047 06/08/20 1441             Family Communication/Anticipated D/C date and plan/Code Status   DVT prophylaxis:  apixaban (ELIQUIS) tablet 5 mg     Code Status: DNR  Family Communication: None Disposition Plan:    Status is: Inpatient  Remains inpatient appropriate because:Inpatient level of care appropriate due to severity of illness   Dispo: The patient is from: Home              Anticipated d/c is to: SNF              Patient currently is medically stable to d/c.   Difficult to place patient Yes           Subjective:   Interval events noted.  He has no complaints.  He said he spoke to  Maralyn Sago, Child psychotherapist, yesterday.  Objective:    Vitals:   06/26/20 1959 06/27/20 0416 06/27/20 0457 06/27/20 0910  BP: (!) 115/57 (!) 98/57 (!) 136/56 117/69  Pulse: 80 (!) 59 86 65  Resp: 20 20 20 18   Temp: 98.5 F (36.9 C) 97.8 F (36.6 C) (!) 97.5 F (36.4 C) 97.8 F (36.6 C)  TempSrc: Oral Oral Oral Oral  SpO2: 99% 95% 96% 97%  Weight:      Height:       No data found.   Intake/Output Summary (Last 24 hours) at 06/27/2020 1210 Last data filed at 06/27/2020 0831 Gross per 24 hour  Intake 118 ml  Output 2250 ml  Net -2132 ml   Filed Weights   06/22/20 0500 06/23/20 0538 06/24/20 0414  Weight: (!) 179.2 kg (!) 178.5 kg (!) 175.5 kg    Exam:  GEN: NAD SKIN: Warm and dry EYES: No pallor or icterus ENT: MMM CV: RRR PULM: CTA B ABD: soft, obese, NT, +BS CNS: AAO x 3, non focal EXT: No edema or tenderness        Data Reviewed:   I have personally reviewed following labs and imaging studies:  Labs: Labs show the following:   Basic Metabolic Panel: Recent Labs  Lab 06/21/20 0651 06/22/20 0502 06/23/20 0611 06/23/20 1829 06/24/20 0353 06/25/20 0400 06/26/20 0342  NA 136  --   --  133* 138 137  --   K 4.4  --   --  5.3* 4.8 4.5  --   CL 87*  --   --  92* 96* 95*  --   CO2 40*  --   --  32 33* 36*  --   GLUCOSE 98  --   --  209* 148* 130*  --   BUN 25*  --   --  29* 28* 30*  --   CREATININE 0.69  --   --  0.70 0.66 0.73  --   CALCIUM 8.6*  --   --  8.6* 8.9 8.9  --   MG 1.9 2.0 2.2  --  2.0 2.0 1.9  PHOS 4.0 4.0 4.0  --  4.4 5.8* 4.4   GFR Estimated Creatinine Clearance: 156.4 mL/min (by C-G formula based on SCr of 0.73 mg/dL). Liver Function Tests: No results for input(s): AST, ALT, ALKPHOS, BILITOT, PROT, ALBUMIN in the last 168 hours. No results for input(s): LIPASE, AMYLASE in the last 168 hours. No results for input(s): AMMONIA in the last 168 hours. Coagulation profile No results for input(s): INR, PROTIME in the last 168  hours.  CBC: Recent Labs  Lab 06/21/20 0651  WBC 5.7  HGB 11.3*  HCT 36.0*  MCV 101.1*  PLT 297   Cardiac Enzymes: No results for input(s): CKTOTAL, CKMB, CKMBINDEX, TROPONINI in the last 168 hours. BNP (last 3 results) No results for input(s): PROBNP in the last 8760 hours. CBG: No results for input(s): GLUCAP in the last 168 hours. D-Dimer: No results for input(s): DDIMER in the last 72 hours. Hgb A1c: No results for input(s): HGBA1C in the last 72 hours. Lipid Profile: No results for input(s): CHOL, HDL, LDLCALC, TRIG, CHOLHDL, LDLDIRECT in the last 72 hours. Thyroid function studies: No results for input(s): TSH, T4TOTAL, T3FREE, THYROIDAB in the last 72 hours.  Invalid input(s): FREET3 Anemia work up: No results for input(s): VITAMINB12, FOLATE, FERRITIN, TIBC, IRON, RETICCTPCT in the last 72 hours. Sepsis Labs: Recent Labs  Lab 06/21/20 0651  WBC 5.7    Microbiology No results found for this or any previous visit (from the past 240 hour(s)).  Procedures and diagnostic studies:  No results found.             LOS: 19 days   Shalunda Lindh  [employer] Chartered loss adjuster on [email or web address]. If 7PM-7AM, please contact night-coverage at [email or web address]     06/27/2020, 12:10 PM

## 2020-06-28 NOTE — TOC Progression Note (Signed)
Transition of Care [facility]) - Progression Note    Patient Details  Name: [name] [name] MRN: [id number] Date of Birth: 1960/02/18  Transition of Care [facility]) CM/SW Contact  Ashley Royalty Lutricia Feil, RN Phone Number: [phone] 06/28/2020, 2:01 PM  Clinical Narrative:    [facility] RN spoke with [place] who does not have a bariatric bed available at any of their facilities (requested a f/u on Monday) however pt is now refusing [facility] placement and requested to be discharged tomorrow.   I have spoken with the pt who is adamant about leaving the hospital tomorrow. Discussed [facility] pt agreeable RN has pursued several agencies for PT/OT however all declined or can not accommodate services for this pt. RN offered out-pt therapy however pt refused. Pt strongly encourage pt have his primary provider to make arrangements in setting this up if he decides to pursue in the future.   RN spoke with DSS Theodis Shove) to see if this would something that they would cover [facility] under the treatment services however declined. [facility] all declined.   Dr. Myriam Forehand and team updated accordingly on the above plan of care. TOC RN remains available for any other needs.    Expected Discharge Plan: Skilled Nursing Facility Barriers to Discharge: Continued Medical Work up  Expected Discharge Plan and Services Expected Discharge Plan: Skilled Nursing Facility     Post Acute Care Choice: Skilled Nursing Facility Living arrangements for the past 2 months: Apartment                   DME Agency: [facility] Date DME Agency Contacted: 06/12/20 Time DME Agency Contacted: [phone] Representative spoke with at DME Agency: Delila Spence reporesentative stated they woudl not continue services for the patient due to intoxication.             Social Determinants of Health (SDOH) Interventions    Readmission Risk Interventions Readmission Risk Prevention Plan 01/11/2020 12/24/2019 01/25/2019   Transportation Screening Complete Complete Complete  PCP or Specialist Appt within 3-5 Days - - -  HRI or Home Care Consult - - -  Social Work Consult for Recovery Care Planning/Counseling - - -  Palliative Care Screening - - -  Medication Review Oceanographer) Complete Complete Complete  PCP or Specialist appointment within 3-5 days of discharge Complete Complete Complete  HRI or Home Care Consult - - Complete  SW Recovery Care/Counseling Consult Complete Complete Complete  Palliative Care Screening Complete Not Applicable Not Applicable  Skilled Nursing Facility Not Applicable Not Applicable Not Applicable

## 2020-06-28 NOTE — Progress Notes (Signed)
Progress Note    [name] [name]  QZE:[id number] DOB: 06-Jul-1960  DOA: 06/08/2020 PCP: [name], DO      Brief Narrative:    Medical records reviewed and are as summarized below:  [name] [name] is a 60 y.o. 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 fib, history of DVT and PE, GERD, hypothyroid, hyperlipidemia, history of PE, chronic bilateral lower extremity venous stasis.  He was brought to the hospital after a fall at home.  He was admitted to the hospital for acute on chronic respiratory failure secondary to COPD exacerbation and CHF exacerbation.  He was also found to have right sixth rib fracture likely from fall at home.  He was treated with steroids, bronchodilators and IV Lasix.  He also developed atrial fibrillation with RVR requiring IV Cardizem infusion.  He was treated with Ativan for alcohol withdrawal syndrome.  Hospital course was complicated by acute gout and possible lower extremity cellulitis.  He was treated with IV antibiotics, steroids, colchicine and allopurinol.  He was found to have full-thickness rotator cuff tear of the right shoulder.  Orthopedic surgery was consulted and his right shoulder was injected with Xylocaine and Kenalog.  Outpatient follow-up with orthopedic surgeon was recommended.    Assessment/Plan:   Active Problems:   Centrilobular emphysema (HCC)   Sepsis (HCC)   Chronic atrial fibrillation (HCC)   HTN (hypertension)   Chronic gout involving toe, unspecified cause, unspecified laterality   TBI (traumatic brain injury) (HCC)   Personal history of DVT (deep vein thrombosis)   Atrial fibrillation with RVR (HCC)   Major depressive disorder, recurrent episode, in partial remission  (HCC)   HLD (hyperlipidemia)   Depression   Pressure ulcer   Alcohol abuse   Alcohol withdrawal syndrome without complication (HCC)   GAD (generalized anxiety disorder)   COPD (chronic obstructive pulmonary disease) (HCC)   Atrial fibrillation, chronic (HCC)   Class 3 obesity with alveolar hypoventilation, serious comorbidity, and body mass index (BMI) of 60.0 to 69.9 in adult [facility])   CHF (congestive heart failure) (HCC)   Weakness of both lower extremities   Physical deconditioning   Elevated lactic acid level   Unable to care for self   Nutrition Problem: Increased nutrient needs Etiology: wound healing  Signs/Symptoms: estimated needs   Body mass index is 58.81 kg/m.  (Morbid obesity)   Acute on chronic systolic CHF: Compensated.  Continue Lasix.  2D echo on 04/07/2020 showed EF estimated at 30 to 35%.  Acute on chronic hypoxic respiratory failure, OSA, OHS, COPD: Continue bronchodilators, steroids, 3L/min oxygen during the day and BiPAP at night.  Acute gout involving bilateral feet and wrists: Completed 7 days of prednisone.  Continue colchicine and allopurinol.  Right shoulder pain, right rotator cuff tear, right sixth rib fracture: Analgesics as needed for pain.  Right shoulder was injected with Xylocaine and Kenalog on 06/22/2020.  Outpatient follow-up with orthopedic surgeon.  Failure to thrive: Patient is unable to care for himself at home.  DSS is involved.  He is now refusing to go to SNF and prefers to go home despite risk for falls.  Stage II decubitus ulcers on the right thigh, left posterior thigh and sacrum (present on admission): Continue local wound care.  Persistent atrial fibrillation: Continue metoprolol and Eliquis  Cellulitis of bilateral lower extremities, chronic venous stasis, Aerococcus bacteriuria: Completed antibiotics.  Other comorbidities include hypertension, depression, hypothyroidism, alcohol use disorder  Awaiting placement to SNF.  Diet  Order            Diet Carb Modified Fluid consistency: Thin; Room service appropriate? Yes  Diet effective now                    Consultants:  Orthopedic surgeon  Procedures:  None    Medications:   . allopurinol  100 mg Oral Daily  . apixaban  5 mg Oral BID  . vitamin C  500 mg Oral BID  . aspirin  81 mg Oral Daily  . atorvastatin  40 mg Oral Daily  . benzonatate  100 mg Oral TID  . colchicine  0.6 mg Oral Daily  . DULoxetine  20 mg Oral Daily  . furosemide  60 mg Oral Daily  . levothyroxine  75 mcg Oral QAC breakfast  . lisinopril  2.5 mg Oral Daily  . methocarbamol  750 mg Oral TID  . metoprolol tartrate  100 mg Oral BID  . montelukast  10 mg Oral QHS  . multivitamin with minerals  1 tablet Oral Daily  . polyethylene glycol  17 g Oral Daily  . Ensure Max Protein  11 oz Oral TID WC  . thiamine  100 mg Oral Daily   Or  . thiamine  100 mg Intravenous Daily   Continuous Infusions:   Anti-infectives (From admission, onward)   Start     Dose/Rate Route Frequency Ordered Stop   06/16/20 1000  nitrofurantoin (macrocrystal-monohydrate) (MACROBID) capsule 100 mg        100 mg Oral Every 12 hours 06/16/20 0843 06/16/20 2209   06/16/20 1000  cefTRIAXone (ROCEPHIN) 2 g in sodium chloride 0.9 % 100 mL IVPB        2 g 200 mL/hr over 30 Minutes Intravenous Every 24 hours 06/16/20 0843 06/20/20 1204   06/11/20 1200  nitrofurantoin (macrocrystal-monohydrate) (MACROBID) capsule 100 mg  Status:  Discontinued        100 mg Oral Every 12 hours 06/11/20 1026 06/16/20 0843   06/09/20 0300  vancomycin (VANCOREADY) IVPB 1750 mg/350 mL  Status:  Discontinued        1,750 mg 175 mL/hr over 120 Minutes Intravenous Every 12 hours 06/08/20 1610 06/09/20 0800   06/08/20 2200  metroNIDAZOLE (FLAGYL) IVPB 500 mg  Status:  Discontinued       Note to Pharmacy: He got one dose in the ED already. Please schedule appropriately.   500 mg 100 mL/hr over 60 Minutes Intravenous Every 8 hours  06/08/20 1436 06/09/20 0800   06/08/20 2000  ceFEPIme (MAXIPIME) 2 g in sodium chloride 0.9 % 100 mL IVPB  Status:  Discontinued        2 g 200 mL/hr over 30 Minutes Intravenous Every 8 hours 06/08/20 1447 06/09/20 0800   06/08/20 1445  metroNIDAZOLE (FLAGYL) IVPB 500 mg  Status:  Discontinued       Note to Pharmacy: He got one dose in the ED already. Please schedule appropriately.   500 mg 100 mL/hr over 60 Minutes Intravenous Every 8 hours 06/08/20 1434 06/08/20 1436   06/08/20 1445  vancomycin (VANCOCIN) IVPB 1000 mg/200 mL premix       "Followed by" Linked Group Details   1,000 mg 200 mL/hr over 60 Minutes Intravenous  Once 06/08/20 1441 06/08/20 1633   06/08/20 1445  vancomycin (VANCOREADY) IVPB 1500 mg/300 mL  Status:  Discontinued       "Followed by" Linked Group Details   1,500 mg 150 mL/hr over 120 Minutes Intravenous  Once 06/08/20 1441 06/09/20 0717   06/08/20 1100  ceFEPIme (MAXIPIME) 2 g in sodium chloride 0.9 % 100 mL IVPB        2 g 200 mL/hr over 30 Minutes Intravenous  Once 06/08/20 1047 06/08/20 1331   06/08/20 1100  metroNIDAZOLE (FLAGYL) IVPB 500 mg        500 mg 100 mL/hr over 60 Minutes Intravenous  Once 06/08/20 1047 06/08/20 1529   06/08/20 1100  vancomycin (VANCOCIN) IVPB 1000 mg/200 mL premix  Status:  Discontinued        1,000 mg 200 mL/hr over 60 Minutes Intravenous  Once 06/08/20 1047 06/08/20 1441             Family Communication/Anticipated D/C date and plan/Code Status   DVT prophylaxis:  apixaban (ELIQUIS) tablet 5 mg     Code Status: DNR  Family Communication: None Disposition Plan:    Status is: Inpatient  Remains inpatient appropriate because:Inpatient level of care appropriate due to severity of illness   Dispo: The patient is from: Home              Anticipated d/c is to: SNF              Patient currently is medically stable to d/c.   Difficult to place patient Yes           Subjective:   He has no complaints.   He feels well.  He wants to go home rather than go to SNF.  He talked about his big TV screen and big surround speakers at home.  He said he missed an Chief Strategy Officer games.  He understands that it may not be safe to go home.  Objective:    Vitals:   06/27/20 2128 06/28/20 0431 06/28/20 1121 06/28/20 1539  BP: (!) 100/48 101/62 123/75 (!) 110/49  Pulse: 67 (!) 51 66 (!) 50  Resp: 16 20 19 20   Temp: 97.9 F (36.6 C) 98.5 F (36.9 C) 98 F (36.7 C)   TempSrc: Oral Oral Oral   SpO2: 100% 100% 97% 99%  Weight:      Height:       No data found.   Intake/Output Summary (Last 24 hours) at 06/28/2020 1602 Last data filed at 06/28/2020 1430 Gross per 24 hour  Intake --  Output 1850 ml  Net -1850 ml   Filed Weights   06/22/20 0500 06/23/20 0538 06/24/20 0414  Weight: (!) 179.2 kg (!) 178.5 kg (!) 175.5 kg    Exam:  GEN: NAD SKIN: Warm and dry EYES: No pallor or icterus ENT: MMM CV: RRR PULM: CTA B ABD: soft, obese, NT, +BS CNS: AAO x 3, non focal EXT: No edema or tenderness        Data Reviewed:   I have personally reviewed following labs and imaging studies:  Labs: Labs show the following:   Basic Metabolic Panel: Recent Labs  Lab 06/22/20 0502 06/23/20 0611 06/23/20 1829 06/23/20 1829 06/24/20 0353 06/25/20 0400 06/26/20 0342  NA  --   --  133*  --  138 137  --   K  --   --  5.3*   < > 4.8 4.5  --   CL  --   --  92*  --  96* 95*  --  CO2  --   --  32  --  33* 36*  --   GLUCOSE  --   --  209*  --  148* 130*  --   BUN  --   --  29*  --  28* 30*  --   CREATININE  --   --  0.70  --  0.66 0.73  --   CALCIUM  --   --  8.6*  --  8.9 8.9  --   MG 2.0 2.2  --   --  2.0 2.0 1.9  PHOS 4.0 4.0  --   --  4.4 5.8* 4.4   < > = values in this interval not displayed.   GFR Estimated Creatinine Clearance: 156.4 mL/min (by C-G formula based on SCr of 0.73 mg/dL). Liver Function Tests: No results for input(s): AST, ALT, ALKPHOS, BILITOT, PROT, ALBUMIN in the last  168 hours. No results for input(s): LIPASE, AMYLASE in the last 168 hours. No results for input(s): AMMONIA in the last 168 hours. Coagulation profile No results for input(s): INR, PROTIME in the last 168 hours.  CBC: No results for input(s): WBC, NEUTROABS, HGB, HCT, MCV, PLT in the last 168 hours. Cardiac Enzymes: No results for input(s): CKTOTAL, CKMB, CKMBINDEX, TROPONINI in the last 168 hours. BNP (last 3 results) No results for input(s): PROBNP in the last 8760 hours. CBG: No results for input(s): GLUCAP in the last 168 hours. D-Dimer: No results for input(s): DDIMER in the last 72 hours. Hgb A1c: No results for input(s): HGBA1C in the last 72 hours. Lipid Profile: No results for input(s): CHOL, HDL, LDLCALC, TRIG, CHOLHDL, LDLDIRECT in the last 72 hours. Thyroid function studies: No results for input(s): TSH, T4TOTAL, T3FREE, THYROIDAB in the last 72 hours.  Invalid input(s): FREET3 Anemia work up: No results for input(s): VITAMINB12, FOLATE, FERRITIN, TIBC, IRON, RETICCTPCT in the last 72 hours. Sepsis Labs: No results for input(s): PROCALCITON, WBC, LATICACIDVEN in the last 168 hours.  Microbiology No results found for this or any previous visit (from the past 240 hour(s)).  Procedures and diagnostic studies:  No results found.             LOS: 20 days   [name] [name]  [facility]   Pager on [email or web address]. If 7PM-7AM, please contact night-coverage at [email or web address]     06/28/2020, 4:02 PM

## 2020-06-28 NOTE — TOC Progression Note (Addendum)
Transition of Care [facility]) - Progression Note    Patient Details  Name: [name] [name] MRN: [id number] Date of Birth: 1961-02-14  Transition of Care [facility]) CM/SW Contact  Luvenia Redden, RN Phone Number:  [phone] 06/28/2020, 10:09 AM  Clinical Narrative:    RN case manager attempted outreach call to [place] ([employer] rep [phone]) and left a message. Will awaiting call back to confirm the request for DSS to cover payment for two months.   Will follow up accordingly.  Addendum: Received a call back. No bariatrics bed available via [facility] facility Lowanda Foster) this weekend.  Expected Discharge Plan: Skilled Nursing Facility Barriers to Discharge: Continued Medical Work up  Expected Discharge Plan and Services Expected Discharge Plan: Skilled Nursing Facility     Post Acute Care Choice: Skilled Nursing Facility Living arrangements for the past 2 months: Apartment                   DME Agency: [facility] Date DME Agency Contacted: 06/12/20 Time DME Agency Contacted: [phone] Representative spoke with at DME Agency: Delila Spence reporesentative stated they woudl not continue services for the patient due to intoxication.             Social Determinants of Health (SDOH) Interventions    Readmission Risk Interventions Readmission Risk Prevention Plan 01/11/2020 12/24/2019 01/25/2019  Transportation Screening Complete Complete Complete  PCP or Specialist Appt within 3-5 Days - - -  HRI or Home Care Consult - - -  Social Work Consult for Recovery Care Planning/Counseling - - -  Palliative Care Screening - - -  Medication Review Oceanographer) Complete Complete Complete  PCP or Specialist appointment within 3-5 days of discharge Complete Complete Complete  HRI or Home Care Consult - - Complete  SW Recovery Care/Counseling Consult Complete Complete Complete  Palliative Care Screening Complete Not Applicable Not Applicable   Skilled Nursing Facility Not Applicable Not Applicable Not Applicable

## 2020-06-29 MED ORDER — OXYCODONE-ACETAMINOPHEN 7.5-325 MG PO TABS: 1.0000 | ORAL_TABLET | Freq: Three times a day (TID) | ORAL | 0 refills | Status: DC | PRN

## 2020-06-29 MED ORDER — COLCHICINE 0.6 MG PO TABS: 0.6000 mg | ORAL_TABLET | Freq: Every day | ORAL | 0 refills | Status: DC

## 2020-06-29 MED ORDER — ALLOPURINOL 100 MG PO TABS: 100.0000 mg | ORAL_TABLET | Freq: Every day | ORAL | 0 refills | Status: DC

## 2020-06-29 MED ORDER — METHOCARBAMOL 500 MG PO TABS: 750.0000 mg | ORAL_TABLET | Freq: Three times a day (TID) | ORAL | 0 refills | Status: DC | PRN

## 2020-06-29 MED ORDER — ACETAMINOPHEN 325 MG PO TABS: 650.0000 mg | ORAL_TABLET | Freq: Three times a day (TID) | ORAL | Status: DC | PRN

## 2020-06-29 MED ORDER — METOPROLOL TARTRATE 100 MG PO TABS: 100.0000 mg | ORAL_TABLET | Freq: Two times a day (BID) | ORAL | 0 refills | Status: DC

## 2020-06-29 NOTE — TOC Transition Note (Addendum)
Transition of Care [facility]) - CM/SW Discharge Note   Patient Details  Name: [name] [name] MRN: [id number] Date of Birth: Jan 09, 1961  Transition of Care [name]) CM/SW Contact:  Luvenia Redden, RN Phone [phone] 06/29/2020, 10:00 AM   Clinical Narrative:    Spoke with pt however unable to obtain [facility] for therapy, pt declined out-pt therapy and is not willing to awaiting a bed offer for the SNF placement. Pt will need transport via EMS due to O2 demand and has his keys to enter his home for entry. Pt states he has some assistance but failed to provider any details. There are no other needs to address at this time. RN attempted once again to encouraged pt on the options for SNF and/or out-pt therapy services however pt continued to declined. Pt will awaiting EMS for discharge home due to his need for O2.   Contacted EMS for bariatric stretcher for transport today.  Awaiting team to confirm pt can be discharged and [facility] RN will call EMS for transportation services.  Final next level of care: Home/Self Care Barriers to Discharge: Barriers Unresolved (comment) (Pt offered SNF however adamantly declined and unable to obtained [facility].)   Patient Goals and CMS Choice Patient states their goals for this hospitalization and ongoing recovery are:: To get back home.      Discharge Placement                  Name of family member notified:  (Attempted to contactx family unsuccessful) Patient and family notified of of transfer: 06/29/20  Discharge Plan and Services     Post Acute Care Choice: Skilled Nursing Facility            DME Agency: [facility] Date DME Agency Contacted: 06/12/20 Time DME Agency Contacted: [phone] Representative spoke with at DME Agency: Delila Spence reporesentative stated they woudl not continue services for the patient due to intoxication.   HH Agency: [facility] Date HH Agency Contacted: 06/28/20 Time HH  Agency Contacted: 1424 Representative spoke with at [facility] Agency: Barbara Cower Careers information officer ([facility]) and [place] ([facility] both declined and Frances Furbish close case from d/c 2 weeks ago due to pt's intoxication. Unable to obtain [facility] services.)  Social Determinants of Health (SDOH) Interventions     Readmission Risk Interventions Readmission Risk Prevention Plan 01/11/2020 12/24/2019 01/25/2019  Transportation Screening Complete Complete Complete  PCP or Specialist Appt within 3-5 Days - - -  HRI or Home Care Consult - - -  Social Work Consult for Recovery Care Planning/Counseling - - -  Palliative Care Screening - - -  Medication Review Oceanographer) Complete Complete Complete  PCP or Specialist appointment within 3-5 days of discharge Complete Complete Complete  HRI or Home Care Consult - - Complete  SW Recovery Care/Counseling Consult Complete Complete Complete  Palliative Care Screening Complete Not Applicable Not Applicable  Skilled Nursing Facility Not Applicable Not Applicable Not Applicable

## 2020-06-29 NOTE — Progress Notes (Signed)
IV removed and site benign.  Patient up to bedside commode - had a bowel movement and took pan to bathroom.  Alert and oriented.  Stated friend was at home awaiting him

## 2020-06-29 NOTE — Discharge Summary (Signed)
Physician Discharge Summary  [name] [name] STM:[id number] DOB: Jul 25, 1960 DOA: 06/08/2020  PCP: [name], DO  Admit date: 06/08/2020 Discharge date: 06/29/2020  Discharge disposition: Home   Recommendations for Outpatient Follow-Up:   Follow-up with PCP in 1 week  Discharge Diagnosis:   Active Problems:   Centrilobular emphysema (HCC)   Sepsis (HCC)   Chronic atrial fibrillation (HCC)   HTN (hypertension)   Chronic gout involving toe, unspecified cause, unspecified laterality   TBI (traumatic brain injury) (HCC)   Personal history of DVT (deep vein thrombosis)   Atrial fibrillation with RVR (HCC)   Major depressive disorder, recurrent episode, in partial remission (HCC)   HLD (hyperlipidemia)   Depression   Pressure ulcer   Alcohol abuse   Alcohol withdrawal syndrome without complication (HCC)   GAD (generalized anxiety disorder)   COPD (chronic obstructive pulmonary disease) (HCC)   Atrial fibrillation, chronic (HCC)   Class 3 obesity with alveolar hypoventilation, serious comorbidity, and body mass index (BMI) of 60.0 to 69.9 in adult [facility])   CHF (congestive heart failure) (HCC)   Weakness of both lower extremities   Physical deconditioning   Elevated lactic acid level   Unable to care for self    Discharge Condition: Stable.  Diet recommendation:  Diet Order            Diet - low sodium heart healthy           Diet Carb Modified Fluid consistency: Thin; Room service appropriate? Yes  Diet effective now                   Code Status: DNR     Hospital Course:   Mr. [name] [name] is a 60 y.o. 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 fib, history of DVT and PE, GERD, hypothyroid, hyperlipidemia,  history of PE, chronic bilateral lower extremity venous stasis.  He was brought to the hospital after a fall at home.  He was admitted to the hospital for acute on chronic respiratory failure secondary to COPD exacerbation and CHF exacerbation.  He was also found to have right sixth rib fracture likely from fall at home.  He was treated with steroids, bronchodilators and IV Lasix.  He also developed atrial fibrillation with RVR requiring IV Cardizem infusion.  He was treated with Ativan for alcohol withdrawal syndrome.  Hospital course was complicated by acute gout and possible lower extremity cellulitis.  He was treated with IV antibiotics, steroids, colchicine and allopurinol.  He was found to have full-thickness rotator cuff tear of the right shoulder.  Orthopedic surgery was consulted and his right shoulder was injected with Xylocaine and Kenalog.  Outpatient follow-up with orthopedic surgeon was recommended.  PT and OT recommended discharge to SNF.  However, case management team had difficulty finding a nursing home for the patient.  After waiting for several days, he decided that he no longer wanted to wait for placement to SNF.  He said he preferred to go home.  He understands that he is at increased risk for falls.  He said he lives with a roommate and he thinks he will be okay at home.  He also said he has somebody who comes home to assist with cooking and other activities about 3 times a week.  Medical Consultants:    Orthopedic surgeon   Discharge Exam:    Vitals:   06/28/20 2059 06/28/20 2356 06/29/20 0537 06/29/20 0742  BP: (!) 114/50 (!) 138/50 (!) 107/50 (!) 123/54  Pulse: (!) 57 81 (!) 51 64  Resp:  20 20 18   Temp:  97.7 F (36.5 C)  98 F (36.7 C)  TempSrc:  Oral  Oral  SpO2:  96% 100% 100%  Weight:      Height:         GEN: NAD SKIN: Warm and dry EYES: No pallor or icterus ENT: MMM CV: RRR PULM: No wheezing or rales heard ABD: soft, obese, NT, +BS CNS: AAO  x 3, non focal EXT: No edema or tenderness   The results of significant diagnostics from this hospitalization (including imaging, microbiology, ancillary and laboratory) are listed below for reference.     Procedures and Diagnostic Studies:   CT CHEST ABDOMEN PELVIS W CONTRAST  Result Date: 06/08/2020 CLINICAL DATA:  Fall this morning with abdominal pain. EXAM: CT CHEST, ABDOMEN, AND PELVIS WITH CONTRAST TECHNIQUE: Multidetector CT imaging of the chest, abdomen and pelvis was performed following the standard protocol during bolus administration of intravenous contrast. CONTRAST:  06/10/2020 OMNIPAQUE IOHEXOL 300 MG/ML  SOLN COMPARISON:  Same day chest radiograph, chest CT dated 04/06/2020, abdominal ultrasound dated 03/10/2019. FINDINGS: CT CHEST FINDINGS Cardiovascular: Vascular calcifications are seen in the coronary arteries and aortic arch. The heart is mildly enlarged. No pericardial effusion. Mediastinum/Nodes: No enlarged mediastinal, hilar, or axillary lymph nodes. Thyroid gland, trachea, and esophagus demonstrate no significant findings. Lungs/Pleura: There is a mild left lower lobe atelectasis. The right lung is clear. There is no pleural effusion or pneumothorax. Musculoskeletal: There is a subacute to chronic fracture of the anterior right sixth rib. This appears new since 04/06/2020. Degenerative changes are seen in the spine, most significant at T8-9. CT ABDOMEN PELVIS FINDINGS Hepatobiliary: The liver is hypoattenuating, consistent with hepatic steatosis. No focal liver abnormality is seen. No gallstones, gallbladder wall thickening, or biliary dilatation. Pancreas: Unremarkable. No pancreatic ductal dilatation or surrounding inflammatory changes. Spleen: Normal in size without focal abnormality. Adrenals/Urinary Tract: Adrenal glands are unremarkable. Kidneys are normal, without renal calculi, focal lesion, or hydronephrosis. Bladder is unremarkable. Stomach/Bowel: Stomach is within normal  limits. Appendix appears normal. There is colonic diverticulosis without evidence of diverticulitis. No evidence of bowel wall thickening, distention, or inflammatory changes. Vascular/Lymphatic: Aortic atherosclerosis. No enlarged abdominal or pelvic lymph nodes. Reproductive: Prostate is unremarkable. Other: No abdominal wall hernia or abnormality. No abdominopelvic ascites. Musculoskeletal: Degenerative changes are seen in the spine, most significant at L1-2 and L3-4. Severe degenerative changes are seen in the right hip. IMPRESSION: 1. Subacute to chronic fracture of the anterior right sixth rib. 2. Hepatic steatosis. Aortic Atherosclerosis (ICD10-I70.0). Electronically Signed   By: 04/08/2020 M.D.   On: 06/08/2020 11:55   DG Chest Port 1 View  Result Date: 06/08/2020 CLINICAL DATA:  Questionable sepsis EXAM: PORTABLE CHEST 1 VIEW COMPARISON:  05/04/2020 FINDINGS: Cardiomegaly and vascular congestion, chronic. No Kerley lines. No air bronchogram. Hazy right apical pleural thickening which is stable. IMPRESSION: Cardiomegaly and vascular congestion. Electronically Signed   By: 05/06/2020 M.D.   On: 06/08/2020 09:54     Labs:   Basic Metabolic Panel: Recent Labs  Lab 06/23/20 0611 06/23/20 1829 06/23/20 1829 06/24/20 0353 06/25/20 0400 06/26/20 0342  NA  --  133*  --  138 137  --   K  --  5.3*   < > 4.8 4.5  --   CL  --  92*  --  96* 95*  --   CO2  --  32  --  33* 36*  --   GLUCOSE  --  209*  --  148* 130*  --   BUN  --  29*  --  28* 30*  --   CREATININE  --  0.70  --  0.66 0.73  --   CALCIUM  --  8.6*  --  8.9 8.9  --   MG 2.2  --   --  2.0 2.0 1.9  PHOS 4.0  --   --  4.4 5.8* 4.4   < > = values in this interval not displayed.   GFR Estimated Creatinine Clearance: 156.4 mL/min (by C-G formula based on SCr of 0.73 mg/dL). Liver Function Tests: No results for input(s): AST, ALT, ALKPHOS, BILITOT, PROT, ALBUMIN in the last 168 hours. No results for input(s): LIPASE, AMYLASE  in the last 168 hours. No results for input(s): AMMONIA in the last 168 hours. Coagulation profile No results for input(s): INR, PROTIME in the last 168 hours.  CBC: No results for input(s): WBC, NEUTROABS, HGB, HCT, MCV, PLT in the last 168 hours. Cardiac Enzymes: No results for input(s): CKTOTAL, CKMB, CKMBINDEX, TROPONINI in the last 168 hours. BNP: Invalid input(s): POCBNP CBG: No results for input(s): GLUCAP in the last 168 hours. D-Dimer No results for input(s): DDIMER in the last 72 hours. Hgb A1c No results for input(s): HGBA1C in the last 72 hours. Lipid Profile No results for input(s): CHOL, HDL, LDLCALC, TRIG, CHOLHDL, LDLDIRECT in the last 72 hours. Thyroid function studies No results for input(s): TSH, T4TOTAL, T3FREE, THYROIDAB in the last 72 hours.  Invalid input(s): FREET3 Anemia work up No results for input(s): VITAMINB12, FOLATE, FERRITIN, TIBC, IRON, RETICCTPCT in the last 72 hours. Microbiology No results found for this or any previous visit (from the past 240 hour(s)).   Discharge Instructions:   Discharge Instructions    Ambulatory referral to Wound Clinic   Complete by: As directed    Diet - low sodium heart healthy   Complete by: As directed    Discharge instructions   Complete by: As directed    Avoid alcohol   Discharge wound care:   Complete by: As directed    Follow up at wound care clinic in 1 week   Increase activity slowly   Complete by: As directed      Allergies as of 06/29/2020   No Known Allergies     Medication List    STOP taking these medications   aspirin 81 MG chewable tablet   loperamide 2 MG capsule Commonly known as: IMODIUM   magnesium oxide 400 (241.3 Mg) MG tablet Commonly known as: MAG-OX   warfarin 5 MG tablet Commonly known as: COUMADIN     TAKE these medications   acetaminophen 325 MG tablet Commonly known as: TYLENOL Take 2 tablets (650 mg total) by mouth every 8 (eight) hours as needed for mild pain  or moderate pain (or Fever >/= 101).   albuterol 108 (90 Base) MCG/ACT inhaler Commonly known as: VENTOLIN HFA Inhale 2 puffs into the lungs every 4 (four) hours as needed for wheezing or shortness of breath.   allopurinol 100 MG tablet Commonly known as: ZYLOPRIM Take 1 tablet (100 mg total) by mouth daily. Start taking on: Jun 30, 2020   apixaban 5 MG Tabs tablet Commonly known  asEverlene Balls Take 1 tablet (5 mg total) by mouth 2 (two) times daily.   atorvastatin 40 MG tablet Commonly known as: LIPITOR TAKE 1 TABLET BY MOUTH ONCE DAILY   colchicine 0.6 MG tablet Take 1 tablet (0.6 mg total) by mouth daily. Start taking on: Jun 30, 2020   DULoxetine 20 MG capsule Commonly known as: CYMBALTA Take 1 capsule (20 mg total) by mouth daily.   gabapentin 300 MG capsule Commonly known as: NEURONTIN Take 300 mg by mouth 3 (three) times daily.   levothyroxine 75 MCG tablet Commonly known as: SYNTHROID Take 1 tablet (75 mcg total) by mouth daily before breakfast.   lisinopril 2.5 MG tablet Commonly known as: ZESTRIL Take by mouth.   meclizine 25 MG tablet Commonly known as: ANTIVERT Take 0.5-1 tablets (12.5-25 mg total) by mouth 3 (three) times daily as needed for dizziness.   methocarbamol 500 MG tablet Commonly known as: ROBAXIN Take 1.5 tablets (750 mg total) by mouth every 8 (eight) hours as needed for muscle spasms.   metoprolol succinate 100 MG 24 hr tablet Commonly known as: TOPROL-XL Take 100 mg by mouth daily.   metoprolol tartrate 100 MG tablet Commonly known as: LOPRESSOR Take 1 tablet (100 mg total) by mouth 2 (two) times daily.   montelukast 10 MG tablet Commonly known as: SINGULAIR Take 1 tablet (10 mg total) by mouth at bedtime.   multivitamin with minerals Tabs tablet Take 1 tablet by mouth daily.   naphazoline-glycerin 0.012-0.2 % Soln Commonly known as: CLEAR EYES REDNESS Place 1-2 drops into both eyes 4 (four) times daily as needed for eye  irritation.   nitroGLYCERIN 0.4 MG SL tablet Commonly known as: NITROSTAT Place 1 tablet under tongue every 5 minutes as needed for chest pain. (No more than 3 doses within 15 minutes)   oxyCODONE-acetaminophen 7.5-325 MG tablet Commonly known as: PERCOCET Take 1 tablet by mouth every 8 (eight) hours as needed for severe pain. What changed: when to take this   potassium chloride SA 20 MEQ tablet Commonly known as: KLOR-CON Take 1 tablet (20 mEq total) by mouth daily.   torsemide 20 MG tablet Commonly known as: DEMADEX Take 4 tablets (80 mg total) by mouth 2 (two) times daily.   traZODone 150 MG tablet Commonly known as: DESYREL Take 1 tablet (150 mg total) by mouth at bedtime.            Discharge Care Instructions  (From admission, onward)         Start     Ordered   06/29/20 0000  Discharge wound care:       Comments: Follow up at wound care clinic in 1 week   06/29/20 1030            Time coordinating discharge: 32 minutes  Signed:  Johnathan Tortorelli  [facility] 06/29/2020, 11:27 AM   Pager on [email or web address]. If 7PM-7AM, please contact night-coverage at [email or web address]

## 2020-06-29 NOTE — Plan of Care (Signed)

## 2020-07-02 ENCOUNTER — Emergency Department: Payer: Medicaid Other

## 2020-07-02 ENCOUNTER — Other Ambulatory Visit (HOSPITAL_COMMUNITY): Payer: Self-pay

## 2020-07-02 ENCOUNTER — Encounter: Payer: Self-pay | Admitting: Emergency Medicine

## 2020-07-02 ENCOUNTER — Other Ambulatory Visit: Payer: Self-pay

## 2020-07-02 ENCOUNTER — Encounter (HOSPITAL_COMMUNITY): Payer: Self-pay

## 2020-07-02 ENCOUNTER — Emergency Department
Admission: EM | Admit: 2020-07-02 | Discharge: 2020-08-02 | Disposition: A | Discharge status: Home / Self Care | Payer: Medicaid Other | Attending: Emergency Medicine | Admitting: Emergency Medicine

## 2020-07-02 DIAGNOSIS — J45909 Unspecified asthma, uncomplicated: Secondary | ICD-10-CM | POA: Diagnosis not present

## 2020-07-02 DIAGNOSIS — E039 Hypothyroidism, unspecified: Secondary | ICD-10-CM | POA: Insufficient documentation

## 2020-07-02 DIAGNOSIS — I2510 Atherosclerotic heart disease of native coronary artery without angina pectoris: Secondary | ICD-10-CM | POA: Diagnosis not present

## 2020-07-02 DIAGNOSIS — I110 Hypertensive heart disease with heart failure: Secondary | ICD-10-CM | POA: Insufficient documentation

## 2020-07-02 DIAGNOSIS — J449 Chronic obstructive pulmonary disease, unspecified: Secondary | ICD-10-CM | POA: Insufficient documentation

## 2020-07-02 DIAGNOSIS — Z7901 Long term (current) use of anticoagulants: Secondary | ICD-10-CM | POA: Insufficient documentation

## 2020-07-02 DIAGNOSIS — Z79899 Other long term (current) drug therapy: Secondary | ICD-10-CM | POA: Insufficient documentation

## 2020-07-02 DIAGNOSIS — I480 Paroxysmal atrial fibrillation: Secondary | ICD-10-CM | POA: Insufficient documentation

## 2020-07-02 DIAGNOSIS — Y908 Blood alcohol level of 240 mg/100 ml or more: Secondary | ICD-10-CM | POA: Insufficient documentation

## 2020-07-02 DIAGNOSIS — F1010 Alcohol abuse, uncomplicated: Secondary | ICD-10-CM | POA: Diagnosis present

## 2020-07-02 DIAGNOSIS — J9600 Acute respiratory failure, unspecified whether with hypoxia or hypercapnia: Secondary | ICD-10-CM | POA: Diagnosis present

## 2020-07-02 DIAGNOSIS — R0902 Hypoxemia: Secondary | ICD-10-CM | POA: Diagnosis not present

## 2020-07-02 DIAGNOSIS — Z20822 Contact with and (suspected) exposure to covid-19: Secondary | ICD-10-CM | POA: Insufficient documentation

## 2020-07-02 DIAGNOSIS — E119 Type 2 diabetes mellitus without complications: Secondary | ICD-10-CM | POA: Insufficient documentation

## 2020-07-02 DIAGNOSIS — R531 Weakness: Secondary | ICD-10-CM | POA: Diagnosis not present

## 2020-07-02 DIAGNOSIS — I517 Cardiomegaly: Secondary | ICD-10-CM | POA: Diagnosis not present

## 2020-07-02 DIAGNOSIS — R0602 Shortness of breath: Secondary | ICD-10-CM | POA: Diagnosis not present

## 2020-07-02 DIAGNOSIS — Z5329 Procedure and treatment not carried out because of patient's decision for other reasons: Secondary | ICD-10-CM

## 2020-07-02 DIAGNOSIS — F1029 Alcohol dependence with unspecified alcohol-induced disorder: Secondary | ICD-10-CM | POA: Diagnosis not present

## 2020-07-02 DIAGNOSIS — F1024 Alcohol dependence with alcohol-induced mood disorder: Secondary | ICD-10-CM | POA: Diagnosis not present

## 2020-07-02 DIAGNOSIS — I5033 Acute on chronic diastolic (congestive) heart failure: Secondary | ICD-10-CM | POA: Diagnosis not present

## 2020-07-02 DIAGNOSIS — I10 Essential (primary) hypertension: Secondary | ICD-10-CM | POA: Diagnosis not present

## 2020-07-02 LAB — CBC
HCT: 39.1 % (ref 39.0–52.0)
Hemoglobin: 12.7 g/dL — ABNORMAL LOW (ref 13.0–17.0)
MCH: 31.3 pg (ref 26.0–34.0)
MCHC: 32.5 g/dL (ref 30.0–36.0)
MCV: 96.3 fL (ref 80.0–100.0)
Platelets: 330 10*3/uL (ref 150–400)
RBC: 4.06 MIL/uL — ABNORMAL LOW (ref 4.22–5.81)
RDW: 17.2 % — ABNORMAL HIGH (ref 11.5–15.5)
WBC: 16.9 10*3/uL — ABNORMAL HIGH (ref 4.0–10.5)
nRBC: 0 % (ref 0.0–0.2)

## 2020-07-02 LAB — URINE DRUG SCREEN, QUALITATIVE (ARMC ONLY)
Amphetamines, Ur Screen: NOT DETECTED
Barbiturates, Ur Screen: NOT DETECTED
Benzodiazepine, Ur Scrn: NOT DETECTED
Cannabinoid 50 Ng, Ur ~~LOC~~: NOT DETECTED
Cocaine Metabolite,Ur ~~LOC~~: NOT DETECTED
MDMA (Ecstasy)Ur Screen: NOT DETECTED
Methadone Scn, Ur: NOT DETECTED
Opiate, Ur Screen: NOT DETECTED
Phencyclidine (PCP) Ur S: NOT DETECTED
Tricyclic, Ur Screen: NOT DETECTED

## 2020-07-02 LAB — HEPATIC FUNCTION PANEL
ALT: 27 U/L (ref 0–44)
AST: 22 U/L (ref 15–41)
Albumin: 3.8 g/dL (ref 3.5–5.0)
Alkaline Phosphatase: 67 U/L (ref 38–126)
Bilirubin, Direct: 0.1 mg/dL (ref 0.0–0.2)
Indirect Bilirubin: 0.5 mg/dL (ref 0.3–0.9)
Total Bilirubin: 0.6 mg/dL (ref 0.3–1.2)
Total Protein: 7.4 g/dL (ref 6.5–8.1)

## 2020-07-02 LAB — BASIC METABOLIC PANEL
Anion gap: 12 (ref 5–15)
BUN: 11 mg/dL (ref 6–20)
CO2: 30 mmol/L (ref 22–32)
Calcium: 8.8 mg/dL — ABNORMAL LOW (ref 8.9–10.3)
Chloride: 96 mmol/L — ABNORMAL LOW (ref 98–111)
Creatinine, Ser: 0.66 mg/dL (ref 0.61–1.24)
GFR, Estimated: >60 mL/min (ref >60)
Glucose, Bld: 142 mg/dL — ABNORMAL HIGH (ref 70–99)
Potassium: 4.1 mmol/L (ref 3.5–5.1)
Sodium: 138 mmol/L (ref 135–145)

## 2020-07-02 LAB — URINALYSIS, COMPLETE (UACMP) WITH MICROSCOPIC
Bilirubin Urine: NEGATIVE
Glucose, UA: NEGATIVE mg/dL
Ketones, ur: NEGATIVE mg/dL
Nitrite: NEGATIVE
Protein, ur: NEGATIVE mg/dL
Specific Gravity, Urine: 1.009 (ref 1.005–1.030)
pH: 6 (ref 5.0–8.0)

## 2020-07-02 LAB — MAGNESIUM: Magnesium: 1.8 mg/dL (ref 1.7–2.4)

## 2020-07-02 LAB — ETHANOL: Alcohol, Ethyl (B): 316 mg/dL (ref <10)

## 2020-07-02 LAB — TROPONIN I (HIGH SENSITIVITY)
Troponin I (High Sensitivity): 32 ng/L — ABNORMAL HIGH (ref <18)
Troponin I (High Sensitivity): 43 ng/L — ABNORMAL HIGH (ref <18)

## 2020-07-02 LAB — RESP PANEL BY RT-PCR (FLU A&B, COVID) ARPGX2
Influenza A by PCR: NEGATIVE
Influenza B by PCR: NEGATIVE
SARS Coronavirus 2 by RT PCR: NEGATIVE

## 2020-07-02 LAB — BRAIN NATRIURETIC PEPTIDE: B Natriuretic Peptide: 88.2 pg/mL (ref 0.0–100.0)

## 2020-07-02 MED ORDER — POTASSIUM CHLORIDE CRYS ER 20 MEQ PO TBCR
20.0000 meq | EXTENDED_RELEASE_TABLET | Freq: Every day | ORAL | Status: DC
  Administered 2020-07-03 – 2020-08-01 (×30): 20 meq via ORAL
  Filled 2020-07-02 (×32): qty 1

## 2020-07-02 MED ORDER — DULOXETINE HCL 20 MG PO CPEP
20.0000 mg | ORAL_CAPSULE | Freq: Every day | ORAL | Status: DC
  Administered 2020-07-03 – 2020-08-01 (×30): 20 mg via ORAL
  Filled 2020-07-02 (×33): qty 1

## 2020-07-02 MED ORDER — ALLOPURINOL 100 MG PO TABS
100.0000 mg | ORAL_TABLET | Freq: Every day | ORAL | Status: DC
  Administered 2020-07-03 – 2020-08-01 (×30): 100 mg via ORAL
  Filled 2020-07-02 (×33): qty 1

## 2020-07-02 MED ORDER — THIAMINE HCL 100 MG/ML IJ SOLN
100.0000 mg | Freq: Every day | INTRAMUSCULAR | Status: DC
  Filled 2020-07-02 (×6): qty 2

## 2020-07-02 MED ORDER — METOPROLOL TARTRATE 50 MG PO TABS
100.0000 mg | ORAL_TABLET | Freq: Two times a day (BID) | ORAL | Status: DC
  Administered 2020-07-02 – 2020-08-02 (×51): 100 mg via ORAL
  Filled 2020-07-02 (×3): qty 2
  Filled 2020-07-02: qty 4
  Filled 2020-07-02 (×51): qty 2

## 2020-07-02 MED ORDER — TORSEMIDE 20 MG PO TABS
80.0000 mg | ORAL_TABLET | Freq: Two times a day (BID) | ORAL | Status: DC
  Administered 2020-07-03 – 2020-07-06 (×7): 80 mg via ORAL
  Filled 2020-07-02 (×12): qty 4

## 2020-07-02 MED ORDER — ALBUTEROL SULFATE HFA 108 (90 BASE) MCG/ACT IN AERS
2.0000 | INHALATION_SPRAY | RESPIRATORY_TRACT | Status: DC | PRN
  Filled 2020-07-02: qty 6.7

## 2020-07-02 MED ORDER — LEVOTHYROXINE SODIUM 50 MCG PO TABS
75.0000 ug | ORAL_TABLET | Freq: Every day | ORAL | Status: DC
  Administered 2020-07-03 – 2020-08-01 (×30): 75 ug via ORAL
  Filled 2020-07-02 (×21): qty 2
  Filled 2020-07-02: qty 1.5
  Filled 2020-07-02 (×6): qty 2

## 2020-07-02 MED ORDER — ACETAMINOPHEN 325 MG PO TABS
650.0000 mg | ORAL_TABLET | Freq: Three times a day (TID) | ORAL | Status: DC | PRN
  Administered 2020-07-13 – 2020-08-02 (×10): 650 mg via ORAL
  Filled 2020-07-02 (×10): qty 2

## 2020-07-02 MED ORDER — LORAZEPAM 2 MG/ML IJ SOLN: 0.0000 mg | Freq: Four times a day (QID) | INTRAMUSCULAR | Status: AC

## 2020-07-02 MED ORDER — COLCHICINE 0.6 MG PO TABS
0.6000 mg | ORAL_TABLET | Freq: Every day | ORAL | Status: DC
  Administered 2020-07-03 – 2020-08-01 (×30): 0.6 mg via ORAL
  Filled 2020-07-02 (×34): qty 1

## 2020-07-02 MED ORDER — THIAMINE HCL 100 MG PO TABS
100.0000 mg | ORAL_TABLET | Freq: Every day | ORAL | Status: DC
  Administered 2020-07-03 – 2020-08-02 (×29): 100 mg via ORAL
  Filled 2020-07-02 (×32): qty 1

## 2020-07-02 MED ORDER — ATORVASTATIN CALCIUM 20 MG PO TABS
40.0000 mg | ORAL_TABLET | Freq: Every day | ORAL | Status: DC
  Administered 2020-07-03 – 2020-08-02 (×31): 40 mg via ORAL
  Filled 2020-07-02 (×30): qty 2

## 2020-07-02 MED ORDER — GABAPENTIN 300 MG PO CAPS
300.0000 mg | ORAL_CAPSULE | Freq: Three times a day (TID) | ORAL | Status: DC
  Administered 2020-07-03 – 2020-07-27 (×13): 300 mg via ORAL
  Filled 2020-07-02 (×41): qty 1

## 2020-07-02 MED ORDER — ADULT MULTIVITAMIN W/MINERALS CH
1.0000 | ORAL_TABLET | Freq: Every day | ORAL | Status: DC
  Administered 2020-07-03 – 2020-08-02 (×31): 1 via ORAL
  Filled 2020-07-02 (×31): qty 1

## 2020-07-02 MED ORDER — LORAZEPAM 2 MG/ML IJ SOLN: 0.0000 mg | Freq: Two times a day (BID) | INTRAMUSCULAR | Status: AC

## 2020-07-02 MED ORDER — TRAZODONE HCL 50 MG PO TABS
150.0000 mg | ORAL_TABLET | Freq: Every day | ORAL | Status: DC
  Administered 2020-07-03 (×2): 150 mg via ORAL
  Administered 2020-07-04: 100 mg via ORAL
  Administered 2020-07-05 – 2020-08-01 (×28): 150 mg via ORAL
  Filled 2020-07-02 (×32): qty 1

## 2020-07-02 MED ORDER — OXYCODONE-ACETAMINOPHEN 7.5-325 MG PO TABS
1.0000 | ORAL_TABLET | Freq: Three times a day (TID) | ORAL | Status: DC | PRN
  Administered 2020-07-03 – 2020-08-02 (×56): 1 via ORAL
  Filled 2020-07-02 (×58): qty 1

## 2020-07-02 MED ORDER — LORAZEPAM 2 MG PO TABS
0.0000 mg | ORAL_TABLET | Freq: Four times a day (QID) | ORAL | Status: AC
  Administered 2020-07-03 (×2): 2 mg via ORAL
  Administered 2020-07-03: 1 mg via ORAL
  Filled 2020-07-02 (×4): qty 1

## 2020-07-02 MED ORDER — METHOCARBAMOL 750 MG PO TABS
750.0000 mg | ORAL_TABLET | Freq: Three times a day (TID) | ORAL | Status: DC | PRN
  Administered 2020-07-04 – 2020-08-01 (×45): 750 mg via ORAL
  Filled 2020-07-02 (×65): qty 1

## 2020-07-02 MED ORDER — MONTELUKAST SODIUM 10 MG PO TABS
10.0000 mg | ORAL_TABLET | Freq: Every day | ORAL | Status: DC
  Administered 2020-07-03 – 2020-08-01 (×27): 10 mg via ORAL
  Filled 2020-07-02 (×39): qty 1

## 2020-07-02 MED ORDER — APIXABAN 5 MG PO TABS
5.0000 mg | ORAL_TABLET | Freq: Two times a day (BID) | ORAL | Status: DC
  Administered 2020-07-02 – 2020-08-02 (×61): 5 mg via ORAL
  Filled 2020-07-02 (×61): qty 1

## 2020-07-02 MED ORDER — LORAZEPAM 2 MG PO TABS
0.0000 mg | ORAL_TABLET | Freq: Two times a day (BID) | ORAL | Status: AC
Start: 2020-07-05 — End: 2020-07-07

## 2020-07-02 MED ORDER — LISINOPRIL 5 MG PO TABS
2.5000 mg | ORAL_TABLET | Freq: Every day | ORAL | Status: AC
  Administered 2020-07-03 – 2020-07-30 (×22): 2.5 mg via ORAL
  Filled 2020-07-02 (×28): qty 1

## 2020-07-02 NOTE — ED Notes (Signed)
Pt hit call bell asking for status on EMS

## 2020-07-02 NOTE — ED Triage Notes (Signed)
Pt comes into the ED via [facility] c/o weakness and wanting to have placement for a SNF. Pt has h/o ETOH abuse.  Pt wears 4L [place] at baseline.  Vitals stable with EMS.  Pt explains his weakness has led him to want connections with physical therapy and placement.

## 2020-07-02 NOTE — ED Notes (Signed)
ED Provider at bedside. 

## 2020-07-02 NOTE — ED Notes (Addendum)
ED Provider Issacs at bedside.  Mr. [name] [name] is Alert and oriented x 4 , understand the risk and possible complications leaving against medical care. MD. Erma Heritage advised pt of possible risk factors relating to leaving. Mr. [name] [name] is adamant on leaving on no longer wants to continue care , he stated that he just wants to go at this moment.   Pt is oriented and understands that he can call EMS for any medical complications.

## 2020-07-02 NOTE — ED Notes (Signed)
[facility] cancelled per Dr. Beatriz Stallion

## 2020-07-02 NOTE — ED Provider Notes (Signed)
[facility] Emergency Department Provider Note   ____________________________________________   Event Date/Time   First MD Initiated Contact with Patient 07/02/20 1513     (approximate)  I have reviewed the triage vital signs and the nursing notes.   HISTORY  Chief Complaint Weakness    HPI [name] [name] is a 60 y.o. male with the below stated past medical history who presents requesting rehabilitation resources.  Patient states that he needs to go to rehab for physical therapy as well as for his alcohol addiction.  Patient states that he is on 4 L nasal cannula at baseline and this has not increased nor does he endorse worsening shortness of breath.  Patient does state that he is having worsening generalized weakness and inability to "get around".  When asked why patient was not discharged from the hospital on 5/15 to a SNF he replied "they could not find me a bed".  Patient currently denies any vision changes, tinnitus, difficulty speaking, facial droop, sore throat, chest pain, shortness of breath, abdominal pain, nausea/vomiting/diarrhea, dysuria, or weakness/numbness/paresthesias in any extremity         Past Medical History:  Diagnosis Date  . Allergy   . Anxiety   . Arthritis   . Asthma   . Brain damage   . Chronic pain of both knees 07/13/2018  . Clotting disorder (HCC)   . COPD (chronic obstructive pulmonary disease) (HCC)   . Depression   . GERD (gastroesophageal reflux disease)   . HFrEF (heart failure with reduced ejection fraction) (HCC)    a. 03/2018 Echo: EF 25-30%, diff HK. Mod LAE.  [name] History of DVT (deep vein thrombosis)   . History of pulmonary embolism    a. Chronic coumadin.  [name] Hypertension   . MI (myocardial infarction) (HCC)   . Morbid obesity (HCC)   . Neck pain 07/13/2018  . NICM (nonischemic cardiomyopathy) (HCC)    a. s/p Cath x 3 - reportedly nl cors. Last cath 2019 in [place]; b. a. 03/2018 Echo: EF 25-30%, diff  HK.  [name] Persistent atrial fibrillation (HCC)    a. 03/2018 s/p DCCV; b. CHA2DS2VASc = 1-->Xarelto (later changed to warfarin); c. 05/2018 recurrent afib-->Amio initiated.  . Sleep apnea   . Sleep apnea     Patient Active Problem List   Diagnosis Date Noted  . Alcohol dependence with unspecified alcohol-induced disorder (HCC)   . Dyspnea and respiratory abnormality 05/04/2020  . Clostridium difficile diarrhea 05/04/2020  . Elevated lactic acid level 05/04/2020  . Unable to care for self 05/04/2020  . Weakness 05/02/2020  . UTI (urinary tract infection) 04/30/2020  . Chronic systolic CHF (congestive heart failure) (HCC) 04/30/2020  . Hypokalemia 04/30/2020  . Chronic respiratory failure (HCC) 04/30/2020  . Physical deconditioning 04/30/2020  . Respiratory arrest (HCC) 04/06/2020  . Weakness of both lower extremities 03/26/2020  . CHF (congestive heart failure) (HCC) 01/27/2020  . Shortness of breath 12/22/2019  . Atrial fibrillation, chronic (HCC) 12/22/2019  . Class 3 obesity with alveolar hypoventilation, serious comorbidity, and body mass index (BMI) of 60.0 to 69.9 in adult (HCC) 12/22/2019  . GAD (generalized anxiety disorder) 10/12/2019  . Second degree burn of left forearm 09/26/2019  . Alcohol withdrawal syndrome without complication (HCC)   . Lower extremity ulceration, unspecified laterality, with fat layer exposed (HCC) 08/09/2019  . Alcohol abuse   . Pressure ulcer 06/26/2019  . Iron deficiency anemia 06/22/2019  . Depression 06/22/2019  . Elevated troponin 06/22/2019  .  Left-sided Bell's palsy 05/08/2019  . Chronic intractable headache 05/03/2019  . Noncompliance by refusing service 05/03/2019  . Facial droop 05/02/2019  . Pharmacologic therapy 04/11/2019  . Disorder of skeletal system 04/11/2019  . Problems influencing health status 04/11/2019  . Chronic anticoagulation (warfarin  COUMADIN) 04/11/2019  . Elevated sed rate 04/11/2019  . Elevated C-reactive protein  (CRP) 04/11/2019  . Elevated hemoglobin A1c 04/11/2019  . Hypoalbuminemia 04/11/2019  . Edema due to hypoalbuminemia 04/11/2019  . Elevated brain natriuretic peptide (BNP) level 04/11/2019  . Chronic hip pain (Right) 04/11/2019  . Osteoarthritis of hip (Right) 04/11/2019  . Chronic atrial fibrillation (HCC) 03/08/2019  . Atrial fibrillation with RVR (HCC) 03/08/2019  . Degenerative joint disease of right hip 03/05/2019  . Hypothyroidism 03/04/2019  . Chronic venous stasis dermatitis of both lower extremities 03/04/2019  . Long term (current) use of anticoagulants 02/23/2019  . PE (pulmonary thromboembolism) (HCC) 01/21/2019  . HLD (hyperlipidemia) 01/21/2019  . CAD (coronary artery disease) 01/21/2019  . Noncompliance 01/09/2019  . Acquired thrombophilia (HCC) 11/28/2018  . Major depressive disorder, recurrent episode, in partial remission (HCC) 09/17/2018  . Chest pain 08/06/2018  . Personal history of DVT (deep vein thrombosis) 07/13/2018  . History of pulmonary embolism (on Coumadin) 07/13/2018  . Neck pain 07/13/2018  . Chronic low back pain (Bilateral)  w/ sciatica (Bilateral) 07/13/2018  . TBI (traumatic brain injury) (HCC) 07/04/2018  . Abnormal thyroid blood test 06/27/2018  . Type 2 diabetes mellitus with other specified complication (HCC) 06/06/2018  . HTN (hypertension) 06/06/2018  . Chronic gout involving toe, unspecified cause, unspecified laterality 06/06/2018  . Morbid obesity with BMI of 60.0-69.9, adult (HCC) 06/06/2018  . Chronic pain syndrome 06/06/2018  . Ulcers of both lower extremities, limited to breakdown of skin (HCC) 06/06/2018  . Gout 06/06/2018  . Diet-controlled diabetes mellitus (HCC) 06/06/2018  . Sepsis (HCC) 03/17/2018  . Acute on chronic diastolic CHF (congestive heart failure) (HCC) 02/02/2018  . Centrilobular emphysema (HCC) 02/02/2018  . Obstructive sleep apnea 02/02/2018  . Lymphedema 02/02/2018  . COPD (chronic obstructive pulmonary  disease) (HCC) 02/02/2018  . Acute on chronic respiratory failure with hypoxia and hypercapnia (HCC) 01/27/2018  . Acute respiratory failure (HCC) 01/16/2018    Past Surgical History:  Procedure Laterality Date  . CARDIOVERSION N/A 03/24/2018   Procedure: CARDIOVERSION;  Surgeon: Antonieta Iba, MD;  Location: [facility] ORS;  Service: Cardiovascular;  Laterality: N/A;  . CARDIOVERSION N/A 08/08/2018   Procedure: CARDIOVERSION;  Surgeon: [name], MD;  Location: [facility] ORS;  Service: Cardiovascular;  Laterality: N/A;  . hearnia repair     X 3- total of two surgeries  . HERNIA REPAIR    . LEG SURGERY      Prior to Admission medications   Medication Sig Start Date End Date Taking? Authorizing Provider  acetaminophen (TYLENOL) 325 MG tablet Take 2 tablets (650 mg total) by mouth every 8 (eight) hours as needed for mild pain or moderate pain (or Fever >/= 101). 06/29/20   Lurene Shadow, MD  albuterol (VENTOLIN HFA) 108 (90 Base) MCG/ACT inhaler Inhale 2 puffs into the lungs every 4 (four) hours as needed for wheezing or shortness of breath. 04/28/20   Tresa Moore, MD  allopurinol (ZYLOPRIM) 100 MG tablet Take 1 tablet (100 mg total) by mouth daily. 06/30/20   Lurene Shadow, MD  apixaban (ELIQUIS) 5 MG TABS tablet Take 1 tablet (5 mg total) by mouth 2 (two) times daily. 04/28/20   Sreenath, Jonelle Sports,  MD  atorvastatin (LIPITOR) 40 MG tablet TAKE 1 TABLET BY MOUTH ONCE DAILY Patient taking differently: Take 40 mg by mouth daily. 08/15/19   Flinchum, Eula FriedMichelle S, FNP  colchicine 0.6 MG tablet Take 1 tablet (0.6 mg total) by mouth daily. 06/30/20   Lurene ShadowAyiku, Bernard, MD  DULoxetine (CYMBALTA) 20 MG capsule Take 1 capsule (20 mg total) by mouth daily. 10/25/19   Karamalegos, Netta NeatAlexander J, DO  gabapentin (NEURONTIN) 300 MG capsule Take 300 mg by mouth 3 (three) times daily.    [provider]  levothyroxine (SYNTHROID) 75 MCG tablet Take 1 tablet (75 mcg total) by mouth daily before  breakfast. 01/13/20   Enedina FinnerPatel, Sona, MD  lisinopril (ZESTRIL) 2.5 MG tablet Take by mouth. 03/06/19   [provider]  meclizine (ANTIVERT) 25 MG tablet Take 0.5-1 tablets (12.5-25 mg total) by mouth 3 (three) times daily as needed for dizziness. 02/26/20   Karamalegos, Netta NeatAlexander J, DO  methocarbamol (ROBAXIN) 750 MG tablet Take 750 mg by mouth 3 (three) times daily. 06/29/20   [provider]  metoprolol succinate (TOPROL-XL) 100 MG 24 hr tablet Take 100 mg by mouth daily. 03/17/20   [provider]  metoprolol tartrate (LOPRESSOR) 100 MG tablet Take 1 tablet (100 mg total) by mouth 2 (two) times daily. 06/29/20   Lurene ShadowAyiku, Bernard, MD  montelukast (SINGULAIR) 10 MG tablet Take 1 tablet (10 mg total) by mouth at bedtime. 10/12/19   Karamalegos, Netta NeatAlexander J, DO  Multiple Vitamin (MULTIVITAMIN WITH MINERALS) TABS tablet Take 1 tablet by mouth daily.    [provider]  naphazoline-glycerin (CLEAR EYES REDNESS) 0.012-0.2 % SOLN Place 1-2 drops into both eyes 4 (four) times daily as needed for eye irritation. 08/14/19   Alford HighlandWieting, Richard, MD  nitroGLYCERIN (NITROSTAT) 0.4 MG SL tablet Place 1 tablet under tongue every 5 minutes as needed for chest pain. (No more than 3 doses within 15 minutes) 03/08/19   Flinchum, Eula FriedMichelle S, FNP  oxyCODONE-acetaminophen (PERCOCET) 7.5-325 MG tablet Take 1 tablet by mouth every 8 (eight) hours as needed for severe pain. 06/29/20   Lurene ShadowAyiku, Bernard, MD  potassium chloride SA (KLOR-CON) 20 MEQ tablet Take 1 tablet (20 mEq total) by mouth daily. 05/02/20   Danford, Earl Liteshristopher P, MD  torsemide (DEMADEX) 20 MG tablet Take 4 tablets (80 mg total) by mouth 2 (two) times daily. Patient not taking: No sig reported 02/02/20   Esaw GrandchildGriffith, Kelly A, DO  traZODone (DESYREL) 150 MG tablet Take 1 tablet (150 mg total) by mouth at bedtime. 10/25/19   Smitty CordsKaramalegos, Alexander J, DO    Allergies Patient has no known allergies.  Family History  Problem Relation Age of  Onset  . Heart failure Mother   . Lung cancer Mother   . Lung cancer Father   . Heart attack Maternal Grandmother   . Heart attack Maternal Grandfather     Social History Social History   Tobacco Use  . Smoking status: Never Smoker  . Smokeless tobacco: Never Used  Vaping Use  . Vaping Use: Never used  Substance Use Topics  . Alcohol use: Not Currently  . Drug use: Never    Review of Systems Constitutional: No fever/chills Eyes: No visual changes. ENT: No sore throat. Cardiovascular: Denies chest pain. Respiratory: Denies shortness of breath. Gastrointestinal: No abdominal pain.  No nausea, no vomiting.  No diarrhea. Genitourinary: Negative for dysuria. Musculoskeletal: Negative for acute arthralgias Skin: Negative for rash. Neurological: Negative for headaches, weakness/numbness/paresthesias in any extremity Psychiatric: Negative for suicidal  ideation/homicidal ideation   ____________________________________________   PHYSICAL EXAM:  VITAL SIGNS: ED Triage Vitals  Enc Vitals Group     BP 07/02/20 1313 (!) 145/105     Pulse Rate 07/02/20 1313 (!) 107     Resp 07/02/20 1313 20     Temp 07/02/20 1313 98.5 F (36.9 C)     Temp Source 07/02/20 1313 Oral     SpO2 07/02/20 1313 91 %     Weight 07/02/20 1310 (!) 410 lb (186 kg)     Height 07/02/20 1310 5\' 8"  (1.727 m)     Head Circumference --      Peak Flow --      Pain Score 07/02/20 1310 9     Pain Loc --      Pain Edu? --      Excl. in GC? --    Constitutional: Alert and oriented. Well appearing and in no acute distress. Eyes: Conjunctivae are normal. PERRL. Head: Atraumatic. Nose: No congestion/rhinnorhea. Mouth/Throat: Mucous membranes are moist. Neck: No stridor Cardiovascular: Grossly normal heart sounds.  Good peripheral circulation. Respiratory: Normal respiratory effort.  No retractions. Gastrointestinal: Soft and nontender. No distention. Musculoskeletal: No obvious deformities Neurologic:   Normal speech and language. No gross focal neurologic deficits are appreciated. Skin:  Skin is warm and dry. No rash noted. Psychiatric: Mood and affect are normal. Speech and behavior are normal.  ____________________________________________   LABS (all labs ordered are listed, but only abnormal results are displayed)  Labs Reviewed  BASIC METABOLIC PANEL - Abnormal; Notable for the following components:      Result Value   Chloride 96 (*)    Glucose, Bld 142 (*)    Calcium 8.8 (*)    All other components within normal limits  CBC - Abnormal; Notable for the following components:   WBC 16.9 (*)    RBC 4.06 (*)    Hemoglobin 12.7 (*)    RDW 17.2 (*)    All other components within normal limits  URINALYSIS, COMPLETE (UACMP) WITH MICROSCOPIC - Abnormal; Notable for the following components:   Color, Urine YELLOW (*)    APPearance HAZY (*)    Hgb urine dipstick SMALL (*)    Leukocytes,Ua LARGE (*)    Bacteria, UA RARE (*)    All other components within normal limits  ETHANOL - Abnormal; Notable for the following components:   Alcohol, Ethyl (B) 316 (*)    All other components within normal limits  TROPONIN I (HIGH SENSITIVITY) - Abnormal; Notable for the following components:   Troponin I (High Sensitivity) 32 (*)    All other components within normal limits  TROPONIN I (HIGH SENSITIVITY) - Abnormal; Notable for the following components:   Troponin I (High Sensitivity) 43 (*)    All other components within normal limits  RESP PANEL BY RT-PCR (FLU A&B, COVID) ARPGX2  URINE DRUG SCREEN, QUALITATIVE ([facility] ONLY)  BRAIN NATRIURETIC PEPTIDE  MAGNESIUM  HEPATIC FUNCTION PANEL   ____________________________________________  EKG  ED ECG REPORT I, 07/04/20, the attending physician, personally viewed and interpreted this ECG.  Date: 07/02/2020 EKG Time: 1317 Rate: 115 Rhythm: Atrial fibrillation with rapid ventricular response QRS Axis: normal Intervals: normal ST/T  Wave abnormalities: normal Narrative Interpretation: no evidence of acute ischemia  ____________________________________________  RADIOLOGY  ED MD interpretation: Chest x-ray shows cardiomegaly without any edema or airspace opacities  Official radiology report(s): No results found.  ____________________________________________   PROCEDURES  Procedure(s) performed (including Critical Care):  Procedures   ____________________________________________   INITIAL IMPRESSION / ASSESSMENT AND PLAN / ED COURSE  As part of my medical decision making, I reviewed the following data within the electronic MEDICAL RECORD NUMBER Nursing notes reviewed and incorporated, Labs reviewed, EKG interpreted, Old chart reviewed, Radiograph reviewed and Notes from prior ED visits reviewed and incorporated        Patient presents requesting resources for rehabilitation.  Care of this patient will be signed out to the oncoming physician at the end of my shift.  All pertinent patient information conveyed and all questions answered.  All further care and disposition decisions will be made by the oncoming physician.      ____________________________________________   FINAL CLINICAL IMPRESSION(S) / ED DIAGNOSES  Final diagnoses:  Weakness  Alcohol dependence with unspecified alcohol-induced disorder (HCC)  Left against medical advice     ED Discharge Orders         Ordered    Involuntary Commitment        07/02/20 1809           Note:  This document was prepared using Dragon voice recognition software and may include unintentional dictation errors.   Merwyn Katos, MD 07/03/20 [phone]

## 2020-07-02 NOTE — ED Notes (Signed)
Transport declined , Per MD. [name] [name] pt will be committed under the care of adult protective services .

## 2020-07-02 NOTE — TOC Transition Note (Signed)
Transition of Care [facility]) - CM/SW Discharge Note   Patient Details  Name: [name] [name] MRN: [id number] Date of Birth: [birth year]  [facility] CM/SW Contact:  [name] Phone Number: [phone] 07/02/2020, 4:28 PM   Clinical Narrative:     Patient has decided he will leave AMA.  Dr. Erma Heritage has spoken with the patient about possible complications in leaving AMA.  Patient stated he "no longer wants care, I want to leave." [facility] has been called for transport back to patient's home.    Barriers to Discharge: No Barriers Identified   Patient Goals and CMS Choice        Discharge Placement                       Discharge Plan and Services                                     Social Determinants of Health (SDOH) Interventions     Readmission Risk Interventions Readmission Risk Prevention Plan 01/11/2020 12/24/2019 01/25/2019  Transportation Screening Complete Complete Complete  PCP or Specialist Appt within 3-5 Days - - -  HRI or Home Care Consult - - -  Social Work Consult for Recovery Care Planning/Counseling - - -  Palliative Care Screening - - -  Medication Review Oceanographer) Complete Complete Complete  PCP or Specialist appointment within 3-5 days of discharge Complete Complete Complete  HRI or Home Care Consult - - Complete  SW Recovery Care/Counseling Consult Complete Complete Complete  Palliative Care Screening Complete Not Applicable Not Applicable  Skilled Nursing Facility Not Applicable Not Applicable Not Applicable

## 2020-07-02 NOTE — ED Triage Notes (Signed)
Pt comes into the ED via EMS from home wanting to go through ETOH detox. On 4L [place] home O2 137/85 87 93% on 4L [place]

## 2020-07-02 NOTE — ED Notes (Signed)
Spoke with MD. Erma Heritage regarding Magistrate calling for information . Phone number [phone] given to MD

## 2020-07-02 NOTE — Progress Notes (Signed)
Arrived same time as APS worker.  She states we can visit together.  When arrived Rocky Link was sitting in chair, he has slurred speech and when talk to him he falls asleep.  Discussed how he has been doing at home, he states not good.  He states was able to walk to the bed last night with walker and back to his chair this morning.  He smells like he has a bowel movement under him, he is sitting with a towel covering his bottom with no clothes on underneath.  Seen alcohol sitting beside him and he admits to drinking to get rid of the pain.  Asked if he could, could he get up and ambulate to the bathroom, he advised "no".  Vitals assessed.  APS woker [facility]) asked if he would go to hospital then to SNF, he advised no, I asked and he advised no.  He kept saying he was not going to hospital.  APS worker advised she will get IVC paper and get him transported later.  I advised Jesse he just needs to go willingly with out having to force him to go, advised him the reasons of not being able to care for his self.  He still remained refusing transport.  Spoke to [facility] outside, she advised will get papers and call me to advise. I left [place] home.  [facility] called me about 15 minutes later and states Rocky Link has agreed to go to hospital for detox then go to SNF.  Contacted [facility] and got an ambulance for transport.  Will watch while he is in the hospital to see if goes to SNF or refuses and goes home again.  Will continue to visit for heart failure if he is discharged.   [name] Kangley EMT-Paramedic [phone]

## 2020-07-02 NOTE — ED Notes (Signed)
Lab at bedside  Pt given meal tray

## 2020-07-02 NOTE — ED Notes (Addendum)
Pt pulled off his lead and took off his blood pressure cuff , Refused to continue being monitored

## 2020-07-02 NOTE — ED Notes (Signed)
Patient transported to Ultrasound 

## 2020-07-02 NOTE — ED Notes (Signed)
MD Isaacs speaking with pt

## 2020-07-02 NOTE — ED Notes (Signed)
Pt stated he wanted to leave AMA. ED MD Erma Heritage advised

## 2020-07-02 NOTE — ED Notes (Signed)
Pt refused lab work. Pt is very upset for being here. Pt stated, "I want to go home. My social worker called [phone]. I have been taking care of myself." This tech informed the pt that she will let know one of our triage nurses.

## 2020-07-02 NOTE — ED Notes (Signed)
Lab at bedside    Failed 2 IV attempts

## 2020-07-02 NOTE — ED Notes (Signed)
Pt calm , collective , denies pain or sob.  Pt given meal tray .

## 2020-07-02 NOTE — ED Notes (Signed)
Called lab for blood draw

## 2020-07-02 NOTE — ED Notes (Signed)
Lab   Alcohol 316  MD. Issacs advised .

## 2020-07-02 NOTE — Discharge Instructions (Addendum)
Patient needs Unna boots. They should be changed twice a week and wrapped in Coban. Current wraps were placed on 07/31/20.  Patient needs BIPAP at night (12/5, 50% FiO2, RR 12) If BiPap is not available, CPAP can be used at a pressure of 10.  No current diet restrictions.

## 2020-07-02 NOTE — ED Provider Notes (Addendum)
Patient initially seen by Dr. Rockey Situ with plan for social work placement.  I was fairly quickly called to the room as patient was agitated that he was waiting for more than an hour and said that he expected to be immediately transferred to a skilled nursing facility.  I discussed that this process can take time and that I was more than willing to keep him here until we can facilitate this, especially given his recent admission and poor health.  Patient also noted to be fairly labile, and ethanol level returned at greater than 300.  I suspect he has some component of alcohol intoxication.  Lab work is actually otherwise fairly reassuring for him.  No AKI.  Mild nonspecific leukocytosis but he said no fever.  Troponins are at baseline.  He is adamant he has no chest pain.  Urine does show some mild pyuria.  Denies any urinary symptoms, but will treat as he is somewhat evasive when asked about any symptoms.  Chest x-ray clear.  COVID is negative.  Initially, plan was to sign outpatient AMA, but I was called by the magistrate who was talking with APS who had taken out a protective order on the patient.  They are concerned that he does not have capacity and he has been found with open pill bottles, alcohol bottles, and confused essentially every time they have checked on him over the last week or 2.  They are concerned about his capacity and ability to care for himself.    Given that he is acutely intoxicated as well today, will IVC and allow him to sober clinically then plan to reassess.  Given APS involvement, I have placed order for psychiatry to evaluate for capacity.  Patient agitated and upset that he has to stay but is amenable to this after some discussion.  Home meds ordered.  He is mildly tachycardic but he is also significantly agitated.  Will plan to monitor this.  He refuses any kind of medical admission at this time as well.    Shaune Pollack, MD 07/02/20 [phone]

## 2020-07-02 NOTE — ED Provider Notes (Addendum)
-----------------------------------------   11:23 PM on 07/02/2020 -----------------------------------------  Blood pressure (!) 146/127, pulse (!) 118, temperature 99 F (37.2 C), temperature source Oral, resp. rate 15, height 5\' 8"  (1.727 m), weight (!) 186 kg, SpO2 99 %.  Assuming care from Dr. .  In short, [name] [name] is a 60 y.o. male with a chief complaint of Weakness .  Refer to the original H&P for additional details.  The current plan of care is to observe until clinically sober, plan for psychiatric evaluation to ensure capacity as there was concern patient is a danger to self.  ----------------------------------------- 3:16 AM on 07/03/2020 -----------------------------------------  Patient has been evaluated by psychiatry, determined to have capacity to make medical decisions and does not represent a threat to himself or others.  There is no indication for psychiatric admission at this time and patient continues to express desire to leave AGAINST MEDICAL ADVICE.  While he continues to make poor decisions regarding his care, he does have capacity to make these decisions and there is no indication to hold him against his will here in the ED.  He appears clinically sober and we will arrange for patient to be discharged home.  He was counseled to follow-up with his PCP and to return to the ED for any new or worsening symptoms.    07/05/2020, MD 07/03/20 0317  ----------------------------------------- 3:40 AM on 07/03/2020 -----------------------------------------  Arrangements for transport home were attempted with EMS given patient is oxygen dependent and ambulates using a walker.  In further discussion with EMS supervisor, EMS states that they will not be able to take the patient home.  They state that they were instructed by APS to not pick up the patient under any circumstances as he will need to be placed in a nursing facility.  While patient expresses  desire to return home, we have no safe mode of transportation for him to get home.  He would benefit from further evaluation by social work to determine appropriate disposition.    07/05/2020, MD 07/03/20 [phone]

## 2020-07-02 NOTE — ED Notes (Signed)
Called [facility] for transport to [address] Charles[place] Ivory Parker  [phone]

## 2020-07-03 DIAGNOSIS — F1029 Alcohol dependence with unspecified alcohol-induced disorder: Secondary | ICD-10-CM | POA: Insufficient documentation

## 2020-07-03 DIAGNOSIS — R531 Weakness: Secondary | ICD-10-CM | POA: Diagnosis not present

## 2020-07-03 DIAGNOSIS — F1010 Alcohol abuse, uncomplicated: Secondary | ICD-10-CM | POA: Diagnosis not present

## 2020-07-03 DIAGNOSIS — F1024 Alcohol dependence with alcohol-induced mood disorder: Secondary | ICD-10-CM | POA: Diagnosis not present

## 2020-07-03 NOTE — ED Notes (Signed)
Night nurse conveyed in handoff report that pt has refused IV placement and will not keep cardiac leads or BP cuff, SPO2 sensor on. Pt has been sleeping and this is why CIWA is overdue and synthroid is overdue. Will allow pt to sleep and will obtain VS and CIWA after pt wakes.

## 2020-07-03 NOTE — ED Notes (Signed)
Spoke with Shanda Bumps, SW about case and pt's intent to not stay in hospital; relayed information about current LCSW that saw patient at 1146 today 07/03/20, and pt reiterated that he wants to leave; Shanda Bumps, SW states that the patient is not under a guardianship from APS per the documentation that she has, but will call her supervisor to clarify and will contact [facility] to follow up.

## 2020-07-03 NOTE — Evaluation (Signed)
Physical Therapy Evaluation Patient Details Name: [name] [name] MRN: [id number] DOB: 10/11/1960 Today's Date: 07/03/2020   History of Present Illness  59yoM who was here last month with plan to d/c to rehab facility but last minute he refused and went home.  Pt now arries with blood alcohol >300, has had falls, reports he has manged at home well but was apparent found in his own feces; admitted with respiratory failure.   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 Pt qadmitted with acute on CRF, ETOH withdrawl, AF c RVR. Rt shoulder MRI done this admission 5/7 revealing "large full-thickness rotator cuff tear with  full-thickness retracted insertional tears of the supraspinatus and  infraspinatus tendons. Both tendons are retracted nearly to the  level of the glenoid. The subscapularis and teres minor tendons  appear intact".  Clinical Impression  Pt was willing to work with PT and eager to get to recliner.  2 part session to get appropriate bariatric walker for safe mobility.  Pt needed assist with bed mobility, less assist with standing/ambulation, though he did fatigue quickly and (per baseline) is not on his feet for extended periods of time anyway.  Pt well known to this PT, he has had many admits and though he is ostensibly able to manage at home (and has 3x/wk aides) he falls, drinks, and generally seems unable to consistently manage at home.  There has been extensive discussions about going to rehab over previous admits, and just last week was set up do go to one before he refused and ended up going home, which he is now back just a few days later.     Follow Up Recommendations SNF    Equipment Recommendations  None recommended by PT    Recommendations for Other Services       Precautions / Restrictions  Precautions Precautions: Fall Restrictions Weight Bearing Restrictions: No      Mobility  Bed Mobility Overal bed mobility: Needs Assistance Bed Mobility: Supine to Sit Rolling: Mod assist   Supine to sit: Mod assist     General bed mobility comments: Pt needed heavy UE assist to get to sitting EOB, he was able to give some assist but could not initiate upward movement w/o direct assist    Transfers Overall transfer level: Needs assistance Equipment used: Rolling walker (2 wheeled) Transfers: Sit to/from Stand Sit to Stand: Min assist         General transfer comment: Pt was able to rise to standing w/o excessive assist.  Showed good effort and despite R shld and L knee pain he maintained standing with walker with confidence.  Ambulation/Gait Ambulation/Gait assistance: Min assist Gait Distance (Feet): 50 Feet Assistive device: Rolling walker (2 wheeled)       General Gait Details: Pt was able to do 2 small loops in the room with walker.  He did have SOB and reported fatigue with the effort but O2 did remain in the 90s.  Stairs            Wheelchair Mobility    Modified Rankin (Stroke Patients Only)       Balance Overall balance assessment: History of Falls Sitting-balance support: Feet supported Sitting balance-Leahy Scale: Good     Standing balance support: Bilateral upper extremity supported Standing balance-Leahy Scale: Fair  Pertinent Vitals/Pain Pain Assessment: 0-10 Pain Score: 5  Pain Location: all over, but primarily R shoudler and L knee    Home Living Family/patient expects to be discharged to:: Unsure Living Arrangements: Alone Available Help at Discharge: Personal care attendant;Available PRN/intermittently (3x/wk) Type of Home: House Home Access: Ramped entrance     Home Layout: One level Home Equipment: Walker - 4 wheels;Shower seat;Hand held shower head;Wheelchair - power       Prior Function Level of Independence: Needs assistance   Gait / Transfers Assistance Needed: Bariatric rollator and scooter for functional mobility, more difficulty with amb recently, several recent hospital admissions  ADL's / Homemaking Assistance Needed: Pt reports having an aide that comes 3x week to assist with self care tasks such as shower as well as IADLs        Hand Dominance   Dominant Hand: Right    Extremity/Trunk Assessment   Upper Extremity Assessment Upper Extremity Assessment: Overall WFL for tasks assessed    Lower Extremity Assessment Lower Extremity Assessment: Overall WFL for tasks assessed       Communication   Communication: No difficulties  Cognition Arousal/Alertness: Awake/alert Behavior During Therapy: WFL for tasks assessed/performed Overall Cognitive Status: Within Functional Limits for tasks assessed                                        General Comments      Exercises     Assessment/Plan    PT Assessment Patient needs continued PT services  PT Problem List Decreased strength;Decreased activity tolerance;Decreased balance;Decreased mobility;Decreased knowledge of use of DME       PT Treatment Interventions DME instruction;Gait training;Functional mobility training;Therapeutic activities;Therapeutic exercise;Balance training;Patient/family education    PT Goals (Current goals can be found in the Care Plan section)  Acute Rehab PT Goals Patient Stated Goal: Pt wants to go home PT Goal Formulation: With patient Time For Goal Achievement: 07/17/20 Potential to Achieve Goals: Fair    Frequency Min 2X/week   Barriers to discharge        Co-evaluation               AM-PAC PT "6 Clicks" Mobility  Outcome Measure Help needed turning from your back to your side while in a flat bed without using bedrails?: A Lot Help needed moving from lying on your back to sitting on the side of a flat bed without using  bedrails?: A Lot Help needed moving to and from a bed to a chair (including a wheelchair)?: A Little Help needed standing up from a chair using your arms (e.g., wheelchair or bedside chair)?: A Little Help needed to walk in hospital room?: A Little Help needed climbing 3-5 steps with a railing? : Total 6 Click Score: 14    End of Session Equipment Utilized During Treatment: Oxygen (3L) Activity Tolerance: Patient tolerated treatment well;Patient limited by fatigue;Patient limited by pain Patient left: in chair;with call bell/phone within reach Nurse Communication: Mobility status PT Visit Diagnosis: Muscle weakness (generalized) (M62.81);Difficulty in walking, not elsewhere classified (R26.2)    Time: 5009-3818, 1005-1020 PT Time Calculation (min) (ACUTE ONLY): 40 min   Charges:   PT Evaluation $PT Eval Low Complexity: 1 Low PT Treatments $Gait Training: 8-22 mins $Therapeutic Activity: 8-22 mins        Malachi Pro, DPT 07/03/2020, 11:21 AM

## 2020-07-03 NOTE — ED Notes (Signed)
PT at bedside. Pt still wearing CPAP.

## 2020-07-03 NOTE — Consult Note (Signed)
[facility] Face-to-Face Psychiatry Consult   Reason for Consult:  Competency  Referring Physician:  Dr. Larinda Buttery Patient Identification: [name] [name] MRN:  [id number] Principal Diagnosis: <principal problem not specified> Diagnosis:  Active Problems:   Acute respiratory failure (HCC)   Alcohol abuse   Total Time spent with patient: 45 minutes  Subjective:   [name] [name] is a 60 y.o. male patient admitted per triage nurse,Pt comes into the ED via EMS from home wanting to go through ETOH detox. On 4L [place] home O2 137/85 87 93% on 4L [place]   COMPETENCY TEST:  This Clinical research associate initiated competency test on patient at the request of the EDP. Patient was found to be AOx4. He was cognizant of state of  and able to think through questions. Short term memory is in tact and there was no concern for delirium or responding to internal or external stimuli. At time patient had a difficult time responding, but that was mostly due to the noise of his Cpap machine.  Pt denies SI/ HI and AVH. There is no concern for harming himself or others.    Past Psychiatric History: Depression  Risk to Self:   Risk to Others:   Prior Inpatient Therapy:   Prior Outpatient Therapy:    Past Medical History:  Past Medical History:  Diagnosis Date  . Allergy   . Anxiety   . Arthritis   . Asthma   . Brain damage   . Chronic pain of both knees 07/13/2018  . Clotting disorder (HCC)   . COPD (chronic obstructive pulmonary disease) (HCC)   . Depression   . GERD (gastroesophageal reflux disease)   . HFrEF (heart failure with reduced ejection fraction) (HCC)    a. 03/2018 Echo: EF 25-30%, diff HK. Mod LAE.  [name] History of DVT (deep vein thrombosis)   . History of pulmonary embolism    a. Chronic coumadin.  [name] Hypertension   . MI (myocardial infarction) (HCC)   . Morbid obesity (HCC)   . Neck pain 07/13/2018  . NICM (nonischemic cardiomyopathy) (HCC)    a. s/p Cath x 3 - reportedly nl cors. Last cath 2019 in [place];  b. a. 03/2018 Echo: EF 25-30%, diff HK.  [name] Persistent atrial fibrillation (HCC)    a. 03/2018 s/p DCCV; b. CHA2DS2VASc = 1-->Xarelto (later changed to warfarin); c. 05/2018 recurrent afib-->Amio initiated.  . Sleep apnea   . Sleep apnea     Past Surgical History:  Procedure Laterality Date  . CARDIOVERSION N/A 03/24/2018   Procedure: CARDIOVERSION;  Surgeon: Antonieta Iba, MD;  Location: [facility] ORS;  Service: Cardiovascular;  Laterality: N/A;  . CARDIOVERSION N/A 08/08/2018   Procedure: CARDIOVERSION;  Surgeon: [name], MD;  Location: [facility] ORS;  Service: Cardiovascular;  Laterality: N/A;  . hearnia repair     X 3- total of two surgeries  . HERNIA REPAIR    . LEG SURGERY     Family History:  Family History  Problem Relation Age of Onset  . Heart failure Mother   . Lung cancer Mother   . Lung cancer Father   . Heart attack Maternal Grandmother   . Heart attack Maternal Grandfather    Family Psychiatric  History: unknown Social History:  Social History   Substance and Sexual Activity  Alcohol Use Not Currently     Social History   Substance and Sexual Activity  Drug Use Never    Social History   Socioeconomic History  . Marital status: Single  Spouse name: Not on file  . Number of children: Not on file  . Years of education: Not on file  . Highest education level: Not on file  Occupational History  . Not on file  Tobacco Use  . Smoking status: Never Smoker  . Smokeless tobacco: Never Used  Vaping Use  . Vaping Use: Never used  Substance and Sexual Activity  . Alcohol use: Not Currently  . Drug use: Never  . Sexual activity: Not Currently  Other Topics Concern  . Not on file  Social History Narrative  . Not on file   Social Determinants of Health   Financial Resource Strain: Not on file  Food Insecurity: Not on file  Transportation Needs: Unmet Transportation Needs  . Lack of Transportation (Medical): Yes  . Lack of Transportation (Non-Medical):  Yes  Physical Activity: Not on file  Stress: Not on file  Social Connections: Not on file   Additional Social History:    Allergies:  No Known Allergies  Labs:  Results for orders placed or performed during the hospital encounter of 07/02/20 (from the past 48 hour(s))  Urinalysis, Complete w Microscopic Urine, Clean Catch     Status: Abnormal   Collection Time: 07/02/20  1:58 PM  Result Value Ref Range   Color, Urine YELLOW (A) YELLOW   APPearance HAZY (A) CLEAR   Specific Gravity, Urine 1.009 1.005 - 1.030   pH 6.0 5.0 - 8.0   Glucose, UA NEGATIVE NEGATIVE mg/dL   Hgb urine dipstick SMALL (A) NEGATIVE   Bilirubin Urine NEGATIVE NEGATIVE   Ketones, ur NEGATIVE NEGATIVE mg/dL   Protein, ur NEGATIVE NEGATIVE mg/dL   Nitrite NEGATIVE NEGATIVE   Leukocytes,Ua LARGE (A) NEGATIVE   RBC / HPF 0-5 0 - 5 RBC/hpf   WBC, UA 21-50 0 - 5 WBC/hpf   Bacteria, UA RARE (A) NONE SEEN   Squamous Epithelial / LPF 0-5 0 - 5    Comment: Performed at [facility], [address] [place], [place] [id number]  Urine Drug Screen, Qualitative     Status: None   Collection Time: 07/02/20  1:58 PM  Result Value Ref Range   Tricyclic, Ur Screen NONE DETECTED NONE DETECTED   Amphetamines, Ur Screen NONE DETECTED NONE DETECTED   MDMA (Ecstasy)Ur Screen NONE DETECTED NONE DETECTED   Cocaine Metabolite,Ur [place] NONE DETECTED NONE DETECTED   Opiate, Ur Screen NONE DETECTED NONE DETECTED   Phencyclidine (PCP) Ur S NONE DETECTED NONE DETECTED   Cannabinoid 50 Ng, Ur  NONE DETECTED NONE DETECTED   Barbiturates, Ur Screen NONE DETECTED NONE DETECTED   Benzodiazepine, Ur Scrn NONE DETECTED NONE DETECTED   Methadone Scn, Ur NONE DETECTED NONE DETECTED    Comment: (NOTE) Tricyclics + metabolites, urine    Cutoff 1000 ng/mL Amphetamines + metabolites, urine  Cutoff 1000 ng/mL MDMA (Ecstasy), urine              Cutoff 500 ng/mL Cocaine Metabolite, urine          Cutoff 300 ng/mL Opiate + metabolites,  urine        Cutoff 300 ng/mL Phencyclidine (PCP), urine         Cutoff 25 ng/mL Cannabinoid, urine                 Cutoff 50 ng/mL Barbiturates + metabolites, urine  Cutoff 200 ng/mL Benzodiazepine, urine              Cutoff 200 ng/mL Methadone, urine  Cutoff 300 ng/mL  The urine drug screen provides only a preliminary, unconfirmed analytical test result and should not be used for non-medical purposes. Clinical consideration and professional judgment should be applied to any positive drug screen result due to possible interfering substances. A more specific alternate chemical method must be used in order to obtain a confirmed analytical result. Gas chromatography / mass spectrometry (GC/MS) is the preferred confirm atory method. Performed at [facility], [address] [place], [place] [address]   Resp Panel by RT-PCR (Flu A&B, Covid) Nasopharyngeal Swab     Status: None   Collection Time: 07/02/20  1:58 PM   Specimen: Nasopharyngeal Swab; Nasopharyngeal(NP) swabs in vial transport medium  Result Value Ref Range   SARS Coronavirus 2 by RT PCR NEGATIVE NEGATIVE    Comment: (NOTE) SARS-CoV-2 target nucleic acids are NOT DETECTED.  The SARS-CoV-2 RNA is generally detectable in upper respiratory specimens during the acute phase of infection. The lowest concentration of SARS-CoV-2 viral copies this assay can detect is 138 copies/mL. A negative result does not preclude SARS-Cov-2 infection and should not be used as the sole basis for treatment or other patient management decisions. A negative result may occur with  improper specimen collection/handling, submission of specimen other than nasopharyngeal swab, presence of viral mutation(s) within the areas targeted by this assay, and inadequate number of viral copies(<138 copies/mL). A negative result must be combined with clinical observations, patient history, and epidemiological information. The expected  result is Negative.  Fact Sheet for Patients:  BloggerCourse.com  Fact Sheet for Healthcare Providers:  SeriousBroker.it  This test is no t yet approved or cleared by the [place] FDA and  has been authorized for detection and/or diagnosis of SARS-CoV-2 by FDA under an Emergency Use Authorization (EUA). This EUA will remain  in effect (meaning this test can be used) for the duration of the COVID-19 declaration under Section 564(b)(1) of the Act, 21 U.S.C.section 360bbb-3(b)(1), unless the authorization is terminated  or revoked sooner.       Influenza A by PCR NEGATIVE NEGATIVE   Influenza B by PCR NEGATIVE NEGATIVE    Comment: (NOTE) The Xpert Xpress SARS-CoV-2/FLU/RSV plus assay is intended as an aid in the diagnosis of influenza from Nasopharyngeal swab specimens and should not be used as a sole basis for treatment. Nasal washings and aspirates are unacceptable for Xpert Xpress SARS-CoV-2/FLU/RSV testing.  Fact Sheet for Patients: BloggerCourse.com  Fact Sheet for Healthcare Providers: SeriousBroker.it  This test is not yet approved or cleared by the [place] FDA and has been authorized for detection and/or diagnosis of SARS-CoV-2 by FDA under an Emergency Use Authorization (EUA). This EUA will remain in effect (meaning this test can be used) for the duration of the COVID-19 declaration under Section 564(b)(1) of the Act, 21 U.S.C. section 360bbb-3(b)(1), unless the authorization is terminated or revoked.  Performed at [facility], [address] [place], [place] [address]   Ethanol     Status: Abnormal   Collection Time: 07/02/20  2:33 PM  Result Value Ref Range   Alcohol, Ethyl (B) 316 (HH) <10 mg/dL    Comment: CRITICAL RESULT CALLED TO, READ BACK BY AND VERIFIED WITH RUSS AVANDANO AT 1552 ON 07/02/20 BY SS (NOTE) Lowest detectable limit for  serum alcohol is 10 mg/dL.  For medical purposes only. Performed at [facility], [address] [place], [place] [address]   Troponin I (High Sensitivity)     Status: Abnormal  Collection Time: 07/02/20  2:33 PM  Result Value Ref Range   Troponin I (High Sensitivity) 32 (H) <18 ng/L    Comment: (NOTE) Elevated high sensitivity troponin I (hsTnI) values and significant  changes across serial measurements may suggest ACS but many other  chronic and acute conditions are known to elevate hsTnI results.  Refer to the "Links" section for chest pain algorithms and additional  guidance. Performed at [facility], [address] [place], [place] [address]   Brain natriuretic peptide     Status: None   Collection Time: 07/02/20  2:33 PM  Result Value Ref Range   B Natriuretic Peptide 88.2 0.0 - 100.0 pg/mL    Comment: Performed at [facility], [address] [place], [place] [address]  Basic metabolic panel     Status: Abnormal   Collection Time: 07/02/20  6:52 PM  Result Value Ref Range   Sodium 138 135 - 145 mmol/L   Potassium 4.1 3.5 - 5.1 mmol/L   Chloride 96 (L) 98 - 111 mmol/L   CO2 30 22 - 32 mmol/L   Glucose, Bld 142 (H) 70 - 99 mg/dL    Comment: Glucose reference range applies only to samples taken after fasting for at least 8 hours.   BUN 11 6 - 20 mg/dL   Creatinine, Ser 8.84 0.61 - 1.24 mg/dL   Calcium 8.8 (L) 8.9 - 10.3 mg/dL   GFR, Estimated >16 >60 mL/min    Comment: (NOTE) Calculated using the CKD-EPI Creatinine Equation (2021)    Anion gap 12 5 - 15    Comment: Performed at [facility], [address] [place], [place] [address]  CBC     Status: Abnormal   Collection Time: 07/02/20  6:52 PM  Result Value Ref Range   WBC 16.9 (H) 4.0 - 10.5 K/uL   RBC 4.06 (L) 4.22 - 5.81 MIL/uL   Hemoglobin 12.7 (L) 13.0 - 17.0 g/dL   HCT 01.0 93.2 - 35.5 %   MCV 96.3 80.0 - 100.0 fL   MCH 31.3 26.0 - 34.0 pg   MCHC 32.5 30.0 -  36.0 g/dL   RDW 73.2 (H) 20.2 - 54.2 %   Platelets 330 150 - 400 K/uL   nRBC 0.0 0.0 - 0.2 %    Comment: Performed at [facility], [address], [place], [place] [address]  Magnesium     Status: None   Collection Time: 07/02/20  6:52 PM  Result Value Ref Range   Magnesium 1.8 1.7 - 2.4 mg/dL    Comment: Performed at [facility], [address] [place], [place] [address]  Hepatic function panel     Status: None   Collection Time: 07/02/20  6:52 PM  Result Value Ref Range   Total Protein 7.4 6.5 - 8.1 g/dL   Albumin 3.8 3.5 - 5.0 g/dL   AST 22 15 - 41 U/L   ALT 27 0 - 44 U/L   Alkaline Phosphatase 67 38 - 126 U/L   Total Bilirubin 0.6 0.3 - 1.2 mg/dL   Bilirubin, Direct 0.1 0.0 - 0.2 mg/dL   Indirect Bilirubin 0.5 0.3 - 0.9 mg/dL    Comment: Performed at [facility], [address] [place], [place] [address]  Troponin I (High Sensitivity)     Status: Abnormal   Collection Time: 07/02/20  6:52 PM  Result Value Ref Range   Troponin I (High Sensitivity) 43 (H) <18 ng/L  Comment: (NOTE) Elevated high sensitivity troponin I (hsTnI) values and significant  changes across serial measurements may suggest ACS but many other  chronic and acute conditions are known to elevate hsTnI results.  Refer to the "Links" section for chest pain algorithms and additional  guidance. Performed at [facility], [address] [place], [place] [address]     Current Facility-Administered Medications  Medication Dose Route Frequency Provider Last Rate Last Admin  . acetaminophen (TYLENOL) tablet 650 mg  650 mg Oral Q8H PRN Shaune Pollack, MD      . albuterol (VENTOLIN HFA) 108 (90 Base) MCG/ACT inhaler 2 puff  2 puff Inhalation Q4H PRN Shaune Pollack, MD      . allopurinol (ZYLOPRIM) tablet 100 mg  100 mg Oral Daily Shaune Pollack, MD      . apixaban Everlene Balls) tablet 5 mg  5 mg Oral BID Shaune Pollack, MD   5 mg at 07/02/20 2217  . atorvastatin  (LIPITOR) tablet 40 mg  40 mg Oral Daily Shaune Pollack, MD      . colchicine tablet 0.6 mg  0.6 mg Oral Daily Shaune Pollack, MD      . DULoxetine (CYMBALTA) DR capsule 20 mg  20 mg Oral Daily Shaune Pollack, MD      . gabapentin (NEURONTIN) capsule 300 mg  300 mg Oral TID Shaune Pollack, MD      . levothyroxine (SYNTHROID) tablet 75 mcg  75 mcg Oral QAC breakfast Shaune Pollack, MD      . lisinopril (ZESTRIL) tablet 2.5 mg  2.5 mg Oral Daily Shaune Pollack, MD      . LORazepam (ATIVAN) injection 0-4 mg  0-4 mg Intravenous Q6H Shaune Pollack, MD       Or  . LORazepam (ATIVAN) tablet 0-4 mg  0-4 mg Oral Q6H Shaune Pollack, MD   1 mg at 07/03/20 0012  . [START ON 07/05/2020] LORazepam (ATIVAN) injection 0-4 mg  0-4 mg Intravenous Q12H Shaune Pollack, MD       Or  . Melene Muller ON 07/05/2020] LORazepam (ATIVAN) tablet 0-4 mg  0-4 mg Oral Q12H Shaune Pollack, MD      . methocarbamol (ROBAXIN) tablet 750 mg  750 mg Oral Q8H PRN Shaune Pollack, MD      . metoprolol tartrate (LOPRESSOR) tablet 100 mg  100 mg Oral BID Shaune Pollack, MD   100 mg at 07/02/20 2217  . montelukast (SINGULAIR) tablet 10 mg  10 mg Oral QHS Shaune Pollack, MD   10 mg at 07/03/20 0011  . multivitamin with minerals tablet 1 tablet  1 tablet Oral Daily Shaune Pollack, MD      . oxyCODONE-acetaminophen (PERCOCET) 7.5-325 MG per tablet 1 tablet  1 tablet Oral Q8H PRN Shaune Pollack, MD      . potassium chloride SA (KLOR-CON) CR tablet 20 mEq  20 mEq Oral Daily Shaune Pollack, MD      . thiamine tablet 100 mg  100 mg Oral Daily Shaune Pollack, MD       Or  . thiamine (B-1) injection 100 mg  100 mg Intravenous Daily Shaune Pollack, MD      . torsemide [facility]) tablet 80 mg  80 mg Oral BID Shaune Pollack, MD      . traZODone (DESYREL) tablet 150 mg  150 mg Oral Dorette Grate, MD   150 mg at 07/03/20 0011   Current Outpatient Medications  Medication Sig Dispense Refill  . acetaminophen (TYLENOL) 325 MG tablet  Take 2  tablets (650 mg total) by mouth every 8 (eight) hours as needed for mild pain or moderate pain (or Fever >/= 101).    [name] albuterol (VENTOLIN HFA) 108 (90 Base) MCG/ACT inhaler Inhale 2 puffs into the lungs every 4 (four) hours as needed for wheezing or shortness of breath. 8 g 2  . allopurinol (ZYLOPRIM) 100 MG tablet Take 1 tablet (100 mg total) by mouth daily. 30 tablet 0  . apixaban (ELIQUIS) 5 MG TABS tablet Take 1 tablet (5 mg total) by mouth 2 (two) times daily. 60 tablet 0  . atorvastatin (LIPITOR) 40 MG tablet TAKE 1 TABLET BY MOUTH ONCE DAILY (Patient taking differently: Take 40 mg by mouth daily.) 90 tablet 1  . colchicine 0.6 MG tablet Take 1 tablet (0.6 mg total) by mouth daily. 30 tablet 0  . DULoxetine (CYMBALTA) 20 MG capsule Take 1 capsule (20 mg total) by mouth daily. 90 capsule 1  . gabapentin (NEURONTIN) 300 MG capsule Take 300 mg by mouth 3 (three) times daily.    [name] levothyroxine (SYNTHROID) 75 MCG tablet Take 1 tablet (75 mcg total) by mouth daily before breakfast. 30 tablet 3  . lisinopril (ZESTRIL) 2.5 MG tablet Take by mouth.    . meclizine (ANTIVERT) 25 MG tablet Take 0.5-1 tablets (12.5-25 mg total) by mouth 3 (three) times daily as needed for dizziness. 60 tablet 2  . methocarbamol (ROBAXIN) 500 MG tablet Take 1.5 tablets (750 mg total) by mouth every 8 (eight) hours as needed for muscle spasms. 30 tablet 0  . metoprolol succinate (TOPROL-XL) 100 MG 24 hr tablet Take 100 mg by mouth daily.    . metoprolol tartrate (LOPRESSOR) 100 MG tablet Take 1 tablet (100 mg total) by mouth 2 (two) times daily. 60 tablet 0  . montelukast (SINGULAIR) 10 MG tablet Take 1 tablet (10 mg total) by mouth at bedtime. 30 tablet 11  . Multiple Vitamin (MULTIVITAMIN WITH MINERALS) TABS tablet Take 1 tablet by mouth daily.    . naphazoline-glycerin (CLEAR EYES REDNESS) 0.012-0.2 % SOLN Place 1-2 drops into both eyes 4 (four) times daily as needed for eye irritation. 15 mL 0  . nitroGLYCERIN  (NITROSTAT) 0.4 MG SL tablet Place 1 tablet under tongue every 5 minutes as needed for chest pain. (No more than 3 doses within 15 minutes) 15 tablet 3  . oxyCODONE-acetaminophen (PERCOCET) 7.5-325 MG tablet Take 1 tablet by mouth every 8 (eight) hours as needed for severe pain. 15 tablet 0  . potassium chloride SA (KLOR-CON) 20 MEQ tablet Take 1 tablet (20 mEq total) by mouth daily. 30 tablet 3  . torsemide (DEMADEX) 20 MG tablet Take 4 tablets (80 mg total) by mouth 2 (two) times daily. (Patient not taking: No sig reported) 60 tablet 1  . traZODone (DESYREL) 150 MG tablet Take 1 tablet (150 mg total) by mouth at bedtime. 90 tablet 1    Musculoskeletal: Strength & Muscle Tone: untested  Gait & Station: unsteady Patient leans: N/A            Psychiatric Specialty Exam:  Presentation  General Appearance: No data recorded Eye Contact:No data recorded Speech:No data recorded Speech Volume:No data recorded Handedness:No data recorded  Mood and Affect  Mood:No data recorded Affect:No data recorded  Thought Process  Thought Processes:No data recorded Descriptions of Associations:No data recorded Orientation:No data recorded Thought Content:No data recorded History of Schizophrenia/Schizoaffective disorder:No data recorded Duration of Psychotic Symptoms:No data recorded Hallucinations:No data recorded Ideas of Reference:No data recorded Suicidal  Thoughts:No data recorded Homicidal Thoughts:No data recorded  Sensorium  Memory:No data recorded Judgment:No data recorded Insight:No data recorded  Executive Functions  Concentration:No data recorded Attention Span:No data recorded Recall:No data recorded Fund of Knowledge:No data recorded Language:No data recorded  Psychomotor Activity  Psychomotor Activity:No data recorded  Assets  Assets:No data recorded  Sleep  Sleep:No data recorded  Physical Exam: Physical Exam Vitals and nursing note reviewed.     Review of Systems  Psychiatric/Behavioral: Negative for depression, hallucinations, substance abuse and suicidal ideas. The patient is not nervous/anxious and does not have insomnia.   All other systems reviewed and are negative.  Blood pressure 117/80, pulse 92, temperature 99 F (37.2 C), temperature source Oral, resp. rate 18, height 5\' 8"  (1.727 m), weight (!) 186 kg, SpO2 96 %. Body mass index is 62.34 kg/m.    Disposition: No evidence of imminent risk to self or others at present.   Patient does not meet criteria for psychiatric inpatient admission. Discussed crisis plan, support from social network, calling 911, coming to the Emergency Department, and calling Suicide Hotline.  Jearld Lesch, NP 07/03/2020 3:59 AM

## 2020-07-03 NOTE — ED Notes (Signed)
VS taken for CIWA. HR noted to be 40. Pt SOB, states that is always SOB but felt more SOB at this time. Pt consented to be placed on cardiac monitor for a few minutes and for EKG. EKG performed, pt in a fib and with BBB.  HR now 91. HR has been fluctuating between 50sw and 80s mostly but did reach 40 while on monitor. EDP made aware.

## 2020-07-03 NOTE — TOC Progression Note (Signed)
Transition of Care [facility]) - Progression Note    Patient Details  Name: [name] [name] MRN: [id number] Date of Birth: [birth year]  [facility]) CM/SW Contact  [name] Phone Number:  [phone] 07/03/2020, 11:46 AM  Clinical Narrative:     CSW spoke with patient who did not d/c yesterday due to call from Magistrate stating the patient is under a APS protective order and cannot discharged without APS consent.  CSW spoke with [name] [facility] [phone], [phone], and emailed her psych evaluation from 07/03/2020 stating the patient did not meet inpatient psychiatric admission.  Ms. [name] stated she had requested an IVC to keep the patient in the ED.  CSW explained how state law and [place] policy does not allow patient who are psych cleared to be held IVC. Ms. [name] verbalized understanding.  CSW spoke with patient ans explained to him about APS protective order.  Patient stated he wanted to go home, and the APS worker has exaggerated the condition in which they found him. CSW encouraged patient to contact Ms. [name] directly and speak with her about his concerns. CSW gave patient Ms. [name]'s contact information.     Barriers to Discharge: No Barriers Identified  Expected Discharge Plan and Services                                                 Social Determinants of Health (SDOH) Interventions    Readmission Risk Interventions Readmission Risk Prevention Plan 01/11/2020 12/24/2019 01/25/2019  Transportation Screening Complete Complete Complete  PCP or Specialist Appt within 3-5 Days - - -  HRI or Home Care Consult - - -  Social Work Consult for Recovery Care Planning/Counseling - - -  Palliative Care Screening - - -  Medication Review Oceanographer) Complete Complete Complete  PCP or Specialist appointment within 3-5 days of discharge Complete Complete Complete  HRI or Home Care Consult - - Complete  SW Recovery  Care/Counseling Consult Complete Complete Complete  Palliative Care Screening Complete Not Applicable Not Applicable  Skilled Nursing Facility Not Applicable Not Applicable Not Applicable

## 2020-07-03 NOTE — ED Notes (Signed)
Pt ambulated in room with PT. Pt now in recliner. On 3L oxygen per [place]. Pt telling PT he wants to go home and can manage at home.

## 2020-07-03 NOTE — ED Notes (Signed)
Spoke with patient with ED charge nurse present to ascertain pt wishes; pt sts that he was told that he was going to be placed, which is why he agreed to come to ED, pt sts that he does not want to stay "but I can't get a ride at this time of night"; explained to patient that he cannot go AMA and be transported in EMS, pt verbalized understanding, sts that he will call tomorrow to arrange transport from ED, sts "I'll call my sister"; pt a/o x 4 GCS 15

## 2020-07-03 NOTE — ED Notes (Signed)
Called pharmacy. They will send missing morning meds.

## 2020-07-03 NOTE — ED Notes (Signed)
Pt sleeping on CPAP, CIWA assessment performed and pt declined ordered PO ativan a/o x 4, given ordered night meds as well

## 2020-07-03 NOTE — ED Notes (Signed)
Called [facility] to transport pt back to residence. Received a call from [facility] refusing to pick pt up stating pt's outside social worker said they are not to transport pt back home. Dr. Larinda Buttery putting consult in for social work.

## 2020-07-04 DIAGNOSIS — G4733 Obstructive sleep apnea (adult) (pediatric): Secondary | ICD-10-CM | POA: Diagnosis not present

## 2020-07-04 NOTE — ED Notes (Signed)
Pt ambulated to restroom with standby assist, unsteady gait. Patient reports sob with ambulation and increased work of breathing is obvious. See charted vitals.   Patient states he does not feel well enough to go home, attending MD notified of this and ambulatory pulse ox - states we will keep pt overnight.

## 2020-07-04 NOTE — ED Notes (Signed)
OT at bedside. 

## 2020-07-04 NOTE — Evaluation (Signed)
Occupational Therapy Evaluation Patient Details Name: Henson Fraticelli MRN: [id number] DOB: 07-12-1960 Today's Date: 07/04/2020    History of Present Illness 59yoM who was here last month with plan to d/c to rehab facility but last minute he refused and went home.  Pt now arries with blood alcohol >300, has had falls, reports he has manged at home well but was apparent found in his own feces; admitted with respiratory failure.   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 Pt qadmitted with acute on CRF, ETOH withdrawl, AF c RVR. Rt shoulder MRI done this admission 5/7 revealing "large full-thickness rotator cuff tear with  full-thickness retracted insertional tears of the supraspinatus and  infraspinatus tendons. Both tendons are retracted nearly to the  level of the glenoid. The subscapularis and teres minor tendons  appear intact".   Clinical Impression   Mr. Ingle was seen for OT evaluation this date. Pt reports he required some assist with ADL management in the home and has a home health aid who comes 3d/week to assist with bathing and some IADL tasks including cooking and cleaning. Pt reports he is Mod I with dressing, grooming, and toileting. He reports his independence has declined recently. Pt on 3 liters of O2 at home. Pt reports increased pain in "all my joints" as well as becoming easily fatigued or out of breath with minimal exertion as the primary limitations to his occupational performance. Pt currently requires MIN assist for UB ADL management as well as MOD A for LB ADL tasks due to current functional impairments (See OT Problem List below). Pt educated in energy conservation strategies including pursed lip breathing, activity pacing, home/routines modifications, AE/DME, and falls prevention. Handout provided.  Pt verbalized understanding and would benefit from additional skilled OT services to maximize recall and carryover of learned techniques and facilitate implementation of learned techniques into daily routines. Upon discharge, recommend STR.       Follow Up Recommendations  SNF    Equipment Recommendations   (Defer to next venue of care.)    Recommendations for Other Services       Precautions / Restrictions Precautions Precautions: Fall Restrictions Weight Bearing Restrictions: No      Mobility Bed Mobility               General bed mobility comments: Deferred. Pt in recliner at start/end of session.    Transfers Overall transfer level: Needs assistance Equipment used: Rolling walker (2 wheeled) Transfers: Sit to/from Stand Sit to Stand: Min assist Stand pivot transfers: Min guard       General transfer comment: Pt was able to rise to standing w/o excessive assist.  Showed good effort and despite R shld and L knee pain he maintains standing for ~2 min before fatigues.    Balance Overall balance assessment: History of Falls;Needs assistance Sitting-balance support: Feet supported Sitting balance-Leahy Scale: Good Sitting balance - Comments: G static sitting   Standing balance support: Bilateral upper extremity supported Standing balance-Leahy Scale: Fair                             ADL either performed or assessed with clinical judgement   ADL Overall ADL's : Needs assistance/impaired Eating/Feeding: Set up;Sitting   Grooming: Set up;Sitting   Upper Body Bathing: Sitting;Minimal assistance;Moderate assistance  Lower Body Bathing: Moderate assistance;Sitting/lateral leans   Upper Body Dressing : Sitting;Minimal assistance   Lower Body Dressing: Moderate assistance;Sit to/from stand;Cueing for compensatory techniques   Toilet Transfer: RW;BSC;Stand-pivot;Min guard           Functional mobility during ADLs: Min guard;Rolling  walker General ADL Comments: Pt declines exertional activity. Able to STS from recliner with min guard assist. Stands for ~2 min w/o physical assist using Bari RW.     Vision Patient Visual Report: No change from baseline       Perception     Praxis      Pertinent Vitals/Pain Pain Assessment: 0-10 Pain Score: 9  Pain Location: R shoulder, Knees, Headache Pain Descriptors / Indicators: Constant;Discomfort Pain Intervention(s): Limited activity within patient's tolerance;Monitored during session;Utilized relaxation techniques     Hand Dominance Right   Extremity/Trunk Assessment Upper Extremity Assessment Upper Extremity Assessment: RUE deficits/detail RUE Deficits / Details: Decreased AROM shoulder flexion to <90; painful with movement 2/2 rotator cuff tear. Grp strength, elbow flexion/ext etc. WFL.   Lower Extremity Assessment Lower Extremity Assessment: Generalized weakness       Communication Communication Communication: No difficulties   Cognition Arousal/Alertness: Awake/alert Behavior During Therapy: WFL for tasks assessed/performed Overall Cognitive Status: Within Functional Limits for tasks assessed                                 General Comments: Pt verbose, talkative t/o session, A and O x 4   General Comments  vitals monitored during functional mobility. Remain WFL (SO2 >97% and HR 100-105) pt on 4L [place].    Exercises Other Exercises Other Exercises: Pt educated in role of OT in acute setting, extensive time to review energy conservation strategies including PLB activity pacing, and safe use of AE/DME for ADL management as well as importance of mobility to maximize functional independence and minimize risk of skin break down. Would benefit from further review.   Shoulder Instructions      Home Living Family/patient expects to be discharged to:: Private residence Living Arrangements: Alone Available Help at Discharge: Personal care  attendant;Available PRN/intermittently (3x/week) Type of Home: Apartment Home Access: Level entry     Home Layout: One level     Bathroom Shower/Tub: Chief Strategy Officer: Standard     Home Equipment: Environmental consultant - 4 wheels;Shower seat;Hand held shower head;Wheelchair - power   Additional Comments: Sleeps in regular bed, lift chair      Prior Functioning/Environment Level of Independence: Needs assistance  Gait / Transfers Assistance Needed: Bariatric rollator and scooter for functional mobility, more difficulty with amb recently, several recent hospital admissions ADL's / Homemaking Assistance Needed: Pt reports having an aide that comes 3x week to assist with self care tasks such as shower as well as IADLs            OT Problem List: Decreased strength;Impaired balance (sitting and/or standing);Decreased safety awareness;Cardiopulmonary status limiting activity;Decreased activity tolerance;Decreased coordination;Decreased knowledge of use of DME or AE;Obesity;Pain      OT Treatment/Interventions: Self-care/ADL training;DME and/or AE instruction;Therapeutic activities;Balance training;Therapeutic exercise;Manual therapy;Energy conservation;Patient/family education;Neuromuscular education    OT Goals(Current goals can be found in the care plan section) Acute Rehab OT Goals Patient Stated Goal: Pt wants to go home OT Goal Formulation: With patient Time For Goal Achievement: 07/09/20 Potential to Achieve Goals: Good ADL Goals Pt Will Perform Grooming: sitting;with supervision;with set-up Pt Will Perform Upper Body  Dressing: sitting;with modified independence Pt Will Perform Lower Body Dressing: with adaptive equipment;with min assist;sitting/lateral leans  OT Frequency: Min 1X/week   Barriers to D/C: Inaccessible home environment;Decreased caregiver support          Co-evaluation              AM-PAC OT "6 Clicks" Daily Activity     Outcome Measure Help  from another person eating meals?: A Little Help from another person taking care of personal grooming?: A Little Help from another person toileting, which includes using toliet, bedpan, or urinal?: A Little Help from another person bathing (including washing, rinsing, drying)?: A Lot Help from another person to put on and taking off regular upper body clothing?: A Little Help from another person to put on and taking off regular lower body clothing?: A Lot 6 Click Score: 16   End of Session Equipment Utilized During Treatment: Rolling walker;Gait belt;Oxygen Nurse Communication: Mobility status;Other (comment) (Request hospital bed for improved pt cofort/mobility)  Activity Tolerance: Patient limited by fatigue;Patient limited by pain Patient left: in chair;with call bell/phone within reach  OT Visit Diagnosis: Unsteadiness on feet (R26.81);Repeated falls (R29.6);Muscle weakness (generalized) (M62.81)                Time: 8502-7741 OT Time Calculation (min): 44 min Charges:  OT General Charges $OT Visit: 1 Visit OT Evaluation $OT Eval Moderate Complexity: 1 Mod OT Treatments $Self Care/Home Management : 23-37 mins  Rockney Ghee, M.S., OTR/L Ascom: [phone] 07/04/20, 4:00 PM

## 2020-07-04 NOTE — TOC Progression Note (Addendum)
Transition of Care [facility]) - Progression Note    Patient Details  Name: [name] [name] MRN: [id number] Date of Birth: [birth year]  Transition of Care [facility]) CM/SW Contact  [name] Phone Number: [phone] 07/04/2020, 3:23 PM  Clinical Narrative:     CSW spoke with patient at length about SNF placement.  Patient is still adamant he would like to return home.  Patient stated "I don;t want to wait another damn month to find placement." Patient asked if he could go to [facility], and stated "[name] the APS social worker said [facility] had made an offer for me."  CSW stated I would check with [facility], but I did not think they offered for Medicaid patient's. Patient asked "what about the other place that accepted me?" CSW stated it was [facility] and I would contact [facility] and speak with them to find out if they had placement availability and I would update him.  Patient verbalized understanding.  Update: CSW confirmed with Sue Lush at [facility] the patient had not been offered placement. Patient updated.    Barriers to Discharge: No Barriers Identified  Expected Discharge Plan and Services                                                 Social Determinants of Health (SDOH) Interventions    Readmission Risk Interventions Readmission Risk Prevention Plan 01/11/2020 12/24/2019 01/25/2019  Transportation Screening Complete Complete Complete  PCP or Specialist Appt within 3-5 Days - - -  HRI or Home Care Consult - - -  Social Work Consult for Recovery Care Planning/Counseling - - -  Palliative Care Screening - - -  Medication Review Ocean[facility]) Complete Complete Complete  PCP or Specialist appointment within 3-5 days of discharge Complete Complete Complete  HRI or Home Care Consult - - Complete  SW Recovery Care/Counseling Consult Complete Complete Complete  Palliative Care Screening Complete Not Applicable Not Applicable  Skilled  Nursing Facility Not Applicable Not Applicable Not Applicable

## 2020-07-04 NOTE — TOC Progression Note (Signed)
Transition of Care [facility]) - Progression Note    Patient Details  Name: [name] [name] MRN: [id number] Date of Birth: 1960/07/08  Transition of Care [facility]) CM/SW Contact  [name] Phone Number: [phone] 07/04/2020, 10:38 AM  Clinical Narrative:     CSW received call from Konrad Felix [facility] [phone], updated this CSW the patient will be served with protective custody paperwork sometime today.  CSW update TOC supervisor, EDP/EDStaff.    Barriers to Discharge: No Barriers Identified  Expected Discharge Plan and Services                                                 Social Determinants of Health (SDOH) Interventions    Readmission Risk Interventions Readmission Risk Prevention Plan 01/11/2020 12/24/2019 01/25/2019  Transportation Screening Complete Complete Complete  PCP or Specialist Appt within 3-5 Days - - -  HRI or Home Care Consult - - -  Social Work Consult for Recovery Care Planning/Counseling - - -  Palliative Care Screening - - -  Medication Review Oceanographer) Complete Complete Complete  PCP or Specialist appointment within 3-5 days of discharge Complete Complete Complete  HRI or Home Care Consult - - Complete  SW Recovery Care/Counseling Consult Complete Complete Complete  Palliative Care Screening Complete Not Applicable Not Applicable  Skilled Nursing Facility Not Applicable Not Applicable Not Applicable

## 2020-07-04 NOTE — ED Notes (Signed)
Pt given 2 cartons of milk

## 2020-07-04 NOTE — ED Notes (Signed)
Pt given a cup of ice with ginger ale

## 2020-07-04 NOTE — ED Notes (Signed)
Pt resting comfortably in recliner, watching TV. Denies needs at this time.  Switched out his ED stretcher for a hospital stretcher.
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
## 2020-07-04 NOTE — ED Notes (Signed)
Pt assisted from bed to bedside chair, able to independently ambulate the short distance with use of a walker and standby support. Patient's bed linens changed. Belongings in reach, call light in reach. Advised patient not to ambulate without assistance.

## 2020-07-04 NOTE — TOC Progression Note (Signed)
Transition of Care [facility]) - Progression Note    Patient Details  Name: [name] [name] MRN: [id number] Date of Birth: [birth year]  Transition [facility]) CM/SW Contact  [name] Phone Number: [phone] 07/04/2020, 3:52 PM  Clinical Narrative:     SNF bed search started.  Patient has been served with emergency protective custody paperwork from [facility] DSS/APS by sheriff's office.    Barriers to Discharge: No Barriers Identified  Expected Discharge Plan and Services                                                 Social Determinants of Health (SDOH) Interventions    Readmission Risk Interventions Readmission Risk Prevention Plan 01/11/2020 12/24/2019 01/25/2019  Transportation Screening Complete Complete Complete  PCP or Specialist Appt within 3-5 Days - - -  HRI or Home Care Consult - - -  Social Work Consult for Recovery Care Planning/Counseling - - -  Palliative Care Screening - - -  Medication Review Oceanographer) Complete Complete Complete  PCP or Specialist appointment within 3-5 days of discharge Complete Complete Complete  HRI or Home Care Consult - - Complete  SW Recovery Care/Counseling Consult Complete Complete Complete  Palliative Care Screening Complete Not Applicable Not Applicable  Skilled Nursing Facility Not Applicable Not Applicable Not Applicable

## 2020-07-04 NOTE — ED Notes (Signed)
Pt resting in recliner, watching TV. Requested pt allow [place] to make a copy of order of protection papers for chart, patient agrees to this. Denies needs at this time, call light and personal items in reach.

## 2020-07-04 NOTE — ED Notes (Signed)
Pt remains in recliner at bedside, belongings and call light in reach. Given ginger ale, denies other needs at this time.  Case management at bedside.

## 2020-07-04 NOTE — ED Notes (Signed)
Pt still resting in recliner, watching tv. Given ginger ale and morning meds, see emar. Pt denies needs at this time. Personal belongings in reach, call light in reach.

## 2020-07-04 NOTE — ED Notes (Signed)
Sheriff deputy present at bedside to serve patient with order of protection.

## 2020-07-05 NOTE — ED Notes (Signed)
Patient given mango ice pop. Patient sitting in bed at this time. No signs of distress at this time.

## 2020-07-05 NOTE — ED Notes (Signed)
Pt assisted to recliner. 

## 2020-07-05 NOTE — ED Notes (Signed)
Patient in chair beside bed. Patient on laptop and phone. Patient given ice chips as requested. No signs of distress at this time. Call light within reach. No needs expressed at this time.

## 2020-07-05 NOTE — ED Notes (Signed)
SW at bedside with pt.

## 2020-07-05 NOTE — ED Notes (Signed)
Patient complaining of 9/10 pain in multiple locations.

## 2020-07-05 NOTE — ED Notes (Signed)
Pt given breakfast tray

## 2020-07-05 NOTE — Progress Notes (Signed)
PT Cancellation Note  Patient Details Name: [name] [name] MRN: [id number] DOB: 10-Sep-1960   Cancelled Treatment:    Reason Eval/Treat Not Completed: Pain limiting ability to participate (Attempted treatment session. Pt reports he iis hurting all over, unable to participate in thearpy at this time. Will attempt again at later date/time.)   Breea Loncar C 07/05/2020, 2:34 PM

## 2020-07-05 NOTE — ED Notes (Signed)
Patient resting in bed at this time, trying to sleeping. Bi-pap on. No signs of distress.

## 2020-07-05 NOTE — ED Notes (Signed)
Patient sitting up in chair eating dinner tray.

## 2020-07-05 NOTE — Progress Notes (Signed)
RT went to emergency room to give patient a bipap machine due to the problems he was having with the smaller one. Pt is on 12/5 50% at this time and puts self on and off of this machine.

## 2020-07-06 DIAGNOSIS — F1024 Alcohol dependence with alcohol-induced mood disorder: Secondary | ICD-10-CM | POA: Diagnosis not present

## 2020-07-06 NOTE — ED Notes (Signed)
PT at bedside.

## 2020-07-06 NOTE — ED Provider Notes (Signed)
Emergency Medicine Observation Re-evaluation Note  [name] [name] is a 60 y.o. male, seen on rounds today.  Pt initially presented to the ED for complaints of Weakness Currently, the patient is sitting comfortably, watching television, has no acute complaints.  Physical Exam  BP 133/87   Pulse 68   Temp 97.7 F (36.5 C) (Oral)   Resp 20   Ht 5\' 8"  (1.727 m)   Wt (!) 186 kg   SpO2 100%   BMI 62.34 kg/m  Physical Exam Gen: No acute distress  Resp: Normal rise and fall of chest Neuro: Moving all four extremities Psych: Resting currently, calm and cooperative when awake    ED Course / MDM  EKG:EKG Interpretation  Date/Time:  Wednesday Jul 02 2020 13:17:08 EDT Ventricular Rate:  115 PR Interval:    QRS Duration: 134 QT Interval:  286 QTC Calculation: 395 R Axis:   66 Text Interpretation: Atrial fibrillation with rapid ventricular response Non-specific intra-ventricular conduction block T wave abnormality, consider inferior ischemia Abnormal ECG Confirmed by UNCONFIRMED, DOCTOR (08-31-1971), editor [id number], [name] [phone]) on 07/02/2020 2:28:15 PM   I have reviewed the labs performed to date as well as medications administered while in observation.  Recent changes in the last 24 hours include no acute events overnight.  Plan  Current plan is for disposition per social work.    Armida Vickroy, 07/04/2020, DO 07/06/20 [phone]

## 2020-07-06 NOTE — TOC Progression Note (Signed)
Transition of Care [facility]) - Progression Note    Patient Details  Name: Kempton Milne MRN: [id number] Date of Birth: 18-Sep-1960  Transition of Care [facility]) CM/SW Contact  [name] Phone Number: [phone] 07/06/2020, 8:33 AM  Clinical Narrative:     Patient has been served emergency protective custody paperwork from [facility] APS/DSS, [name] is main contact [phone], [phone]. Patient pending SNF placement.  CSW faxed progress notes and PT notes attn: Tessa to [facility], which made offer during last admission.  CSW is waiting for confirmation from [facility] for placement.       Barriers to Discharge: No Barriers Identified  Expected Discharge Plan and Services                                                 Social Determinants of Health (SDOH) Interventions    Readmission Risk Interventions Readmission Risk Prevention Plan 01/11/2020 12/24/2019 01/25/2019  Transportation Screening Complete Complete Complete  PCP or Specialist Appt within 3-5 Days - - -  HRI or Home Care Consult - - -  Social Work Consult for Recovery Care Planning/Counseling - - -  Palliative Care Screening - - -  Medication Review Oceanographer) Complete Complete Complete  PCP or Specialist appointment within 3-5 days of discharge Complete Complete Complete  HRI or Home Care Consult - - Complete  SW Recovery Care/Counseling Consult Complete Complete Complete  Palliative Care Screening Complete Not Applicable Not Applicable  Skilled Nursing Facility Not Applicable Not Applicable Not Applicable

## 2020-07-06 NOTE — ED Notes (Signed)
This RN at bedside to administer morning meds. Pt is sitting in the recliner eating breakfast at this time. No needs expressed. This RN changed pt's bed linen including fitted sheet, pads, and pillow case.

## 2020-07-06 NOTE — ED Notes (Signed)
Lunch mea tray given at this time.

## 2020-07-06 NOTE — Progress Notes (Signed)
Physical Therapy Treatment Patient Details Name: [name] [name] MRN: [id number] DOB: May 07, 1960 Today's Date: 07/06/2020    History of Present Illness 59yoM who was here last month with plan to d/c to rehab facility but last minute he refused and went home.  Pt now arries with blood alcohol >300, has had falls, reports he has manged at home well but was apparent found in his own feces; admitted with respiratory failure.   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 Pt qadmitted with acute on CRF, ETOH withdrawl, AF c RVR. Rt shoulder MRI done this admission 5/7 revealing "large full-thickness rotator cuff tear with  full-thickness retracted insertional tears of the supraspinatus and  infraspinatus tendons. Both tendons are retracted nearly to the  level of the glenoid. The subscapularis and teres minor tendons  appear intact".    PT Comments    Pt in recliner upon entry, using laptop computer on bedside table. Pt agreeable to session, reports to feel much better than previous day when author attempted. Pt on 4L/min as per baseline flow rate, O2 100% as received. Pt is an excellent historian share tales from long ago of hunting alligators and wild boar. Pt performs STS transfers c BRW multiple times in session, powers up quickly, no LOB, but unable to use only one hand on RW long enough to manage his gown or pericare. Author manages O2 line for AMB, despite offers from patient to AMB without O2. Author adds an extension tube to allow use of O2 when up AMB. Pt has significant DOE after first AMB interval, but is able to perform another after a few minutes. Will continue to benefit from skilled PT rehab at DC to facilitate extensive needs for ADL, IADL and to maximize independence and safety with basic mobility.   Follow Up  Recommendations  SNF     Equipment Recommendations  None recommended by PT    Recommendations for Other Services       Precautions / Restrictions Precautions Precautions: Fall Restrictions Weight Bearing Restrictions: No    Mobility  Bed Mobility               General bed mobility comments: Pt in recliner at start/end of session.    Transfers Overall transfer level: Needs assistance Equipment used: Rolling walker (2 wheeled) Transfers: Sit to/from Stand Sit to Stand: Supervision Stand pivot transfers: Supervision       General transfer comment: assistied with gown closure and pad stuck to his butt.  Ambulation/Gait   Gait Distance (Feet): 75 Feet Assistive device: Rolling walker (2 wheeled)       General Gait Details: Progressed to 4 loops to door, then very SOB, SpO2: 98% on 4L/min (baseline flow rate); SOB recovers after 5 minutes, another shorter walk takes place after that.   Stairs             Wheelchair Mobility    Modified Rankin (Stroke Patients Only)       Balance                                            Cognition Arousal/Alertness: Awake/alert Behavior During Therapy: WFL for tasks assessed/performed Overall Cognitive Status: Within Functional Limits for tasks assessed  Exercises      General Comments        Pertinent Vitals/Pain Faces Pain Scale: Hurts little more Pain Location: widespread chronic pain, reports to be at his baseline    Home Living                      Prior Function            PT Goals (current goals can now be found in the care plan section) Acute Rehab PT Goals Patient Stated Goal: Pt wants to go home PT Goal Formulation: With patient Time For Goal Achievement: 07/17/20 Potential to Achieve Goals: Fair Progress towards PT goals: Progressing toward goals    Frequency    Min 2X/week      PT Plan  Current plan remains appropriate    Co-evaluation              AM-PAC PT "6 Clicks" Mobility   Outcome Measure  Help needed turning from your back to your side while in a flat bed without using bedrails?: A Lot Help needed moving from lying on your back to sitting on the side of a flat bed without using bedrails?: A Lot Help needed moving to and from a bed to a chair (including a wheelchair)?: A Little Help needed standing up from a chair using your arms (e.g., wheelchair or bedside chair)?: A Little Help needed to walk in hospital room?: A Little Help needed climbing 3-5 steps with a railing? : Total 6 Click Score: 14    End of Session Equipment Utilized During Treatment: Oxygen Activity Tolerance: Patient tolerated treatment well;Patient limited by fatigue;Patient limited by pain Patient left: in chair;with call bell/phone within reach   PT Visit Diagnosis: Muscle weakness (generalized) (M62.81);Difficulty in walking, not elsewhere classified (R26.2)     Time: 9628-3662 PT Time Calculation (min) (ACUTE ONLY): 25 min  Charges:  $Therapeutic Exercise: 23-37 mins                     12:14 PM, 07/06/20 Rosamaria Lints, PT, DPT Physical Therapist - [facility]  [phone] (ASCOM)     Blonnie Maske C 07/06/2020, 12:08 PM

## 2020-07-06 NOTE — ED Notes (Signed)
Breakfast tray given at this time.  

## 2020-07-07 DIAGNOSIS — F1029 Alcohol dependence with unspecified alcohol-induced disorder: Secondary | ICD-10-CM | POA: Diagnosis not present

## 2020-07-07 DIAGNOSIS — I4891 Unspecified atrial fibrillation: Secondary | ICD-10-CM | POA: Diagnosis not present

## 2020-07-07 MED ORDER — TORSEMIDE 20 MG PO TABS
40.0000 mg | ORAL_TABLET | Freq: Two times a day (BID) | ORAL | Status: DC
  Administered 2020-07-07 – 2020-07-18 (×23): 40 mg via ORAL
  Administered 2020-07-19: 20 mg via ORAL
  Administered 2020-07-19 – 2020-08-02 (×28): 40 mg via ORAL
  Filled 2020-07-07 (×57): qty 2

## 2020-07-07 NOTE — ED Notes (Signed)
Pt alert, meds given.

## 2020-07-07 NOTE — ED Provider Notes (Signed)
Patient with softer blood pressures.  Completely asymptomatic.  When positioned in his back pressure come up.  He is on 80 mg of Lasix twice daily.  Will decrease to 40 twice daily and continue to monitor.   Nita Sickle, MD 07/07/20 [phone]

## 2020-07-07 NOTE — ED Notes (Signed)
Pt OOB to bedside chair, assisted with breakfast tray. NAD noted.  Pt requests muscle relaxer, pharmacy secure messaged to please send

## 2020-07-07 NOTE — ED Notes (Signed)
Pt in bed .  

## 2020-07-07 NOTE — ED Notes (Signed)
Pt resting at this time with cpap in place. Pt not allowing this RN to take vitals at this time due to "I am trying to sleep". Call bell in reach.

## 2020-07-07 NOTE — TOC Progression Note (Signed)
Transition of Care [facility]) - Progression Note    Patient Details  Name: [name] [name] MRN: [id number] Date of Birth: Jul 08, 1960  Transition of Care [facility]) CM/SW Contact  [name] Phone Number: [phone] 07/07/2020, 9:39 AM  Clinical Narrative:     SNF placement search still on ongoing. [facility] APS/DSS has protective custody of patient, [name] SWK [phone], [phone] is the main contact.    Barriers to Discharge: No Barriers Identified  Expected Discharge Plan and Services                                                 Social Determinants of Health (SDOH) Interventions    Readmission Risk Interventions Readmission Risk Prevention Plan 01/11/2020 12/24/2019 01/25/2019  Transportation Screening Complete Complete Complete  PCP or Specialist Appt within 3-5 Days - - -  HRI or Home Care Consult - - -  Social Work Consult for Recovery Care Planning/Counseling - - -  Palliative Care Screening - - -  Medication Review Oceanographer) Complete Complete Complete  PCP or Specialist appointment within 3-5 days of discharge Complete Complete Complete  HRI or Home Care Consult - - Complete  SW Recovery Care/Counseling Consult Complete Complete Complete  Palliative Care Screening Complete Not Applicable Not Applicable  Skilled Nursing Facility Not Applicable Not Applicable Not Applicable

## 2020-07-07 NOTE — ED Notes (Signed)
resumed care from [place].  Pt alert, sitting in recliner on computer.   No acute distress.

## 2020-07-07 NOTE — ED Notes (Signed)
Pt continued to refuse to cooperate during the night to allow me to take his vitals , saying " I'm fine " and goes back to sleep.   I did check on pt and he appeared to be stable with ABCS  WNL. While sleeping

## 2020-07-07 NOTE — ED Notes (Signed)
Discussed with doctor regarding lower BP readings. Pt repositioned in bed and BP improved. HE is A&O and has no complaints. Okay to give night meds and continue to monitor.

## 2020-07-08 DIAGNOSIS — I4891 Unspecified atrial fibrillation: Secondary | ICD-10-CM | POA: Diagnosis not present

## 2020-07-08 DIAGNOSIS — F1029 Alcohol dependence with unspecified alcohol-induced disorder: Secondary | ICD-10-CM | POA: Diagnosis not present

## 2020-07-08 NOTE — TOC Progression Note (Signed)
Transition of Care [facility]) - Progression Note    Patient Details  Name: [name] [name] MRN: [id number] Date of Birth: 12-Sep-1960  Transition of Care [facility]) CM/SW Contact  [name] Phone Number: [phone] 07/08/2020, 9:00 AM  Clinical Narrative:     CSW spoke with [name] [facility] APS [phone], [phone], about SNF placement for patient.  CSW stated I sent the patient's FL2 information out via the HUB, and received over 10 declines but to bed offers.  CSW asked if [facility] had been looking for placement  Ms. [name] stated they had not been looking for placement for the patient.  CSW stated that I would need [facility] tp assist with placement since the patient was under their custody.  Ms. [name] verbalized understanding and stated she would request for the [facility] placement team to begin looking for placement.    Barriers to Discharge: No Barriers Identified  Expected Discharge Plan and Services                                                 Social Determinants of Health (SDOH) Interventions    Readmission Risk Interventions Readmission Risk Prevention Plan 01/11/2020 12/24/2019 01/25/2019  Transportation Screening Complete Complete Complete  PCP or Specialist Appt within 3-5 Days - - -  HRI or Home Care Consult - - -  Social Work Consult for Recovery Care Planning/Counseling - - -  Palliative Care Screening - - -  Medication Review Oceanographer) Complete Complete Complete  PCP or Specialist appointment within 3-5 days of discharge Complete Complete Complete  HRI or Home Care Consult - - Complete  SW Recovery Care/Counseling Consult Complete Complete Complete  Palliative Care Screening Complete Not Applicable Not Applicable  Skilled Nursing Facility Not Applicable Not Applicable Not Applicable

## 2020-07-08 NOTE — ED Provider Notes (Signed)
Emergency Medicine Observation Re-evaluation Note  [name] [name] is a 60 y.o. male, seen on rounds today.  Pt initially presented to the ED for complaints of Weakness Currently, the patient is resting comfortably.  Physical Exam  BP 112/66 (BP Location: Left Arm)   Pulse (!) 52   Temp 98.4 F (36.9 C) (Oral)   Resp (!) 22   Ht 5\' 8"  (1.727 m)   Wt (!) 186 kg   SpO2 97%   BMI 62.34 kg/m  Physical Exam Gen: No acute distress  Resp: Normal rise and fall of chest Neuro: Moving all four extremities Psych: Resting currently, calm and cooperative when awake    ED Course / MDM  EKG:EKG Interpretation  Date/Time:  Wednesday Jul 02 2020 13:17:08 EDT Ventricular Rate:  115 PR Interval:    QRS Duration: 134 QT Interval:  286 QTC Calculation: 395 R Axis:   66 Text Interpretation: Atrial fibrillation with rapid ventricular response Non-specific intra-ventricular conduction block T wave abnormality, consider inferior ischemia Abnormal ECG Confirmed by UNCONFIRMED, DOCTOR (08-31-1971), editor [id number], [name] [name] [phone]) on 07/02/2020 2:28:15 PM   I have reviewed the labs performed to date as well as medications administered while in observation.  Recent changes in the last 24 hours include no acute events overnight.  Plan  Current plan is for social work disposition, placement.    [name] [name], 07/04/2020, DO 07/08/20 [id number]

## 2020-07-08 NOTE — ED Notes (Signed)
Pt on cpap sleeping.

## 2020-07-08 NOTE — Progress Notes (Signed)
Physical Therapy Treatment Patient Details Name: [name] [name] MRN: [id number] DOB: 1960/06/13 Today's Date: 07/08/2020    History of Present Illness Pt is a 60 yo M with recent prior admission and plan to d/c to rehab facility but last minute he refused and went home.  Pt now arrives with blood alcohol >300, falls, reports he has manged at home well but was apparent found in his own feces; admitted with respiratory failure.   PMH: morbid obesity, CRF, obesity hypoventilation syndrome, HTN, depression, GAD, ETOH abuse, left deafness since age of 72, COPD, nonischemic cardiomyopathy, HF c reduced EF, PAF, OSA, DVT, PE, GERD, hypoTSH, HLD, LEE. Pt admitted with acute on chronic RF, ETOH withdrawl, AF c RVR. Rt shoulder MRI done this admission 5/7 revealing "large full-thickness rotator cuff tear with  full-thickness retracted insertional tears of the supraspinatus and  infraspinatus tendons. Both tendons are retracted nearly to the level of the glenoid. The subscapularis and teres minor tendons appear intact".    PT Comments    Pt was pleasant and motivated to participate during the session.  Pt performed therex with good effort with no adverse symptoms noted.  Multiple sit to/from stand transfers with good eccentric and concentric control and stability and again with no adverse symptoms.  Upon ambulating, however, pt began to c/o dizziness and returned to sitting after ambulating around 30 feet with symptoms quickly resolving back to baseline, nursing notified.  Pt's SpO2 and HR were both WNL on 4LO2/min.  Pt will benefit from PT services in a SNF setting upon discharge to safely address deficits listed in patient problem list for decreased caregiver assistance and eventual return to PLOF.     Follow Up Recommendations  SNF     Equipment Recommendations  None recommended by PT    Recommendations for Other Services       Precautions / Restrictions Precautions Precautions:  Fall Restrictions Weight Bearing Restrictions: No    Mobility  Bed Mobility               General bed mobility comments: Pt in recliner at start/end of session.    Transfers Overall transfer level: Needs assistance Equipment used: Rolling walker (2 wheeled) Transfers: Sit to/from Stand Sit to Stand: Supervision         General transfer comment: Good eccentric and concentric control and stability  Ambulation/Gait Ambulation/Gait assistance: Min guard Gait Distance (Feet): 30 Feet Assistive device: Rolling walker (2 wheeled) Gait Pattern/deviations: Step-through pattern;Decreased step length - right;Decreased step length - left Gait velocity: decreased   General Gait Details: Pt steady with amb but distance limited secondary to dizziness that resolved upon returning to sitting, nursing notified   Stairs             Wheelchair Mobility    Modified Rankin (Stroke Patients Only)       Balance Overall balance assessment: History of Falls;Needs assistance Sitting-balance support: Feet supported Sitting balance-Leahy Scale: Good     Standing balance support: Bilateral upper extremity supported Standing balance-Leahy Scale: Fair                              Cognition Arousal/Alertness: Awake/alert Behavior During Therapy: WFL for tasks assessed/performed Overall Cognitive Status: Within Functional Limits for tasks assessed  Exercises Total Joint Exercises Ankle Circles/Pumps: Strengthening;Both;10 reps Long Arc Quad: Strengthening;Both;10 reps (with manual resistance) Knee Flexion: Strengthening;10 reps;Both (with manual resistance)    General Comments        Pertinent Vitals/Pain Pain Assessment: 0-10 Pain Score: 6  Pain Location: R shoulder Pain Descriptors / Indicators: Aching;Sore Pain Intervention(s): Premedicated before session;Monitored during session    Home Living                       Prior Function            PT Goals (current goals can now be found in the care plan section) Progress towards PT goals: PT to reassess next treatment    Frequency    Min 2X/week      PT Plan Current plan remains appropriate    Co-evaluation              AM-PAC PT "6 Clicks" Mobility   Outcome Measure  Help needed turning from your back to your side while in a flat bed without using bedrails?: A Lot Help needed moving from lying on your back to sitting on the side of a flat bed without using bedrails?: A Lot Help needed moving to and from a bed to a chair (including a wheelchair)?: A Little Help needed standing up from a chair using your arms (e.g., wheelchair or bedside chair)?: A Little Help needed to walk in hospital room?: A Little Help needed climbing 3-5 steps with a railing? : Total 6 Click Score: 14    End of Session Equipment Utilized During Treatment: Oxygen Activity Tolerance: Other (comment) (Pt limited by dizziness in standing) Patient left: in chair;with call bell/phone within reach Nurse Communication: Mobility status;Other (comment) (dizziness in standing) PT Visit Diagnosis: Muscle weakness (generalized) (M62.81);Difficulty in walking, not elsewhere classified (R26.2)     Time: 4562-5638 PT Time Calculation (min) (ACUTE ONLY): 27 min  Charges:  $Gait Training: 8-22 mins $Therapeutic Exercise: 8-22 mins                     D. Scott Griffon Herberg PT, DPT 07/08/20, 3:22 PM

## 2020-07-08 NOTE — ED Notes (Signed)
Pt given two cups of ice

## 2020-07-08 NOTE — TOC Progression Note (Signed)
Transition of Care [facility]) - Progression Note    Patient Details  Name: Olly Shiner MRN: [id number] Date of Birth: 1960/06/30  Transition of Care [facility]) CM/SW Contact  [name] Phone Number:  [phone] 07/08/2020, 3:07 PM  Clinical Narrative:     CSW called and left a voicemail to Everly at [employer] [phone], inquiring about possible placement offer.    Barriers to Discharge: No Barriers Identified  Expected Discharge Plan and Services                                                 Social Determinants of Health (SDOH) Interventions    Readmission Risk Interventions Readmission Risk Prevention Plan 01/11/2020 12/24/2019 01/25/2019  Transportation Screening Complete Complete Complete  PCP or Specialist Appt within 3-5 Days - - -  HRI or Home Care Consult - - -  Social Work Consult for Recovery Care Planning/Counseling - - -  Palliative Care Screening - - -  Medication Review Oceanographer) Complete Complete Complete  PCP or Specialist appointment within 3-5 days of discharge Complete Complete Complete  HRI or Home Care Consult - - Complete  SW Recovery Care/Counseling Consult Complete Complete Complete  Palliative Care Screening Complete Not Applicable Not Applicable  Skilled Nursing Facility Not Applicable Not Applicable Not Applicable

## 2020-07-08 NOTE — ED Notes (Signed)
Pt is on the phone with someone named [name]. Who is asking questions about who is in charge and for phone numbers for the hospital. Community Inclusion Specialist from center in [place] to help disabled people. [phone] ext [phone].

## 2020-07-08 NOTE — ED Notes (Signed)
Pt sleeping. 

## 2020-07-08 NOTE — ED Notes (Signed)
PT at bedside.

## 2020-07-08 NOTE — ED Notes (Signed)
Pt given meal tray. Now on phone with Child psychotherapist. Advised they have a place for him but it is in [place]. He advised he doesn't want to go that far away. Pt does have a home with a roommate.

## 2020-07-08 NOTE — ED Notes (Signed)
Pt on phone again ordering lunch from outside of the hospital to have someone deliver it.

## 2020-07-08 NOTE — ED Notes (Signed)
RN answered call bell. Patient had ambulated on his own to the toilet and had a bowel movement. He needed assistance to clean himself. Then patient ambulated alone with a walker back around to the other side of the room and into his chair. Pt is very independent and doesn't need assistance around the room.

## 2020-07-09 ENCOUNTER — Telehealth: Payer: Self-pay | Admitting: Licensed Clinical Social Worker

## 2020-07-09 ENCOUNTER — Ambulatory Visit: Payer: Medicaid Other | Admitting: Licensed Clinical Social Worker

## 2020-07-09 NOTE — ED Notes (Signed)
RN in to administer 1000 meds. Pt refused at this time stating he would like to wait until after he finishes eating. Pt instructed to push call light when ready to take medications

## 2020-07-09 NOTE — Progress Notes (Signed)
   Heart Failure Note  Spoke with patient's nurse Lorrie and the patient reminded him to be limiting his fluid intake to less than 2000 mL or 8 cups of fluids daily.  Patient goes on to voice that he would like to have an MRI of his hip and knees.  Splane that that would have to come from his attending physician here in the ED.  Tresa Endo RN CHFN

## 2020-07-09 NOTE — Chronic Care Management (AMB) (Signed)
[facility]) Care Management  07/09/2020  [name] [name] Feb 17, 1960 [id number]   Ongoing LCSW management will be transitioned to the High-Risk Managed Medicaid LCSW, [name]. Patient remains active and has been notified of assignment change. Patient is requesting that LCSW Alona Bene contact him to assist with advocating for him, in regards to his hospital discharge plan. Pt shared that he is scheduled to go to court on 07/11/20 at 9:00 AM   Jenel Lucks, MSW, LCSW Lutricia Horsfall [facility] Management [facility]  [facility] [email or web address] Phone [phone] 10:42 AM

## 2020-07-09 NOTE — Progress Notes (Signed)
OT Cancellation Note  Patient Details Name: [name] [name] MRN: [id number] DOB: 14-Aug-1960   Cancelled Treatment:    Reason Eval/Treat Not Completed: Patient declined, no reason specified. OT attempted to see pt for TX session this date. Pt received resting in bed. Pt politely declines therapy this am stating "I'm mighty comfortable". Pt requesting therapist return later this date and endorsing desire to get out of his room a little. Will re-attempt at a later date/time as available.   Rockney Ghee, M.S., OTR/L Ascom: [phone] 07/09/20, 11:50 AM

## 2020-07-09 NOTE — ED Notes (Signed)
Pt in bed watching tv  meds given.

## 2020-07-09 NOTE — ED Notes (Signed)
Heart failure RN at beside and states that pt needs strict I&O recorded. Pt is to be limited to a fluid intake daily.

## 2020-07-09 NOTE — Telephone Encounter (Signed)
See CCM Note

## 2020-07-09 NOTE — ED Notes (Signed)
Pt sleeping, pt on cpap.

## 2020-07-09 NOTE — ED Notes (Signed)
Resumed care from lorrie rn.  Pt sitting in recliner no acute distress   Pt on 3 liters oxygen [place].  Pt watching tv.

## 2020-07-09 NOTE — ED Provider Notes (Signed)
Patient requesting MRI of neck, back, and bilateral knees. He states that he has difficulty walking due to feeling weakness in both knees with the left being worse than the right. He also states that he has a rotator cuff tear and hasn't been told what the plan will be to fix it.  Chart review shows that he was evaluated by Dr. Deeann Saint and given a steroid injection on/around May 7 of this year. Dr. Rondel Baton note was reviewed. He does not feel the patient is a good surgical candidate at this facility. Patient does have a significant PMH and multiple comorbidities.  Plan to avoid surgery due to the above was discussed with the patient who was also advised that even if he had an MRI of neck, back, or knees that surgery would likely not be performed.  He was advised that once residential placement is confirmed, he should schedule a follow-up appointment with Dr. Hyacinth Meeker or someone in his office.   Chinita Pester, FNP 07/09/20 1157    Shaune Pollack, MD 07/10/20 1558

## 2020-07-09 NOTE — ED Notes (Signed)
Pt educated by this RN regarding fluid intake and restrictions of 2000 ml/24 hrs. Examples given to pt to demonstrate various options he would have throughout the day and how he should spread out his intake to prevent exceeding restriction amounts.

## 2020-07-09 NOTE — ED Notes (Signed)
Responded to call bell. Pt complaining of 8/10 pain in right hip and low back. Requesting pain medication.

## 2020-07-09 NOTE — ED Notes (Signed)
Pt asleep in bed at this time.

## 2020-07-09 NOTE — TOC Progression Note (Signed)
Transition of Care [facility]) - Progression Note    Patient Details  Name: Delonte Musich MRN: [id number] Date of Birth: [birth year]  Transition of Care [facility]) CM/SW Contact  [name] Phone Number:  [phone] 07/09/2020, 2:04 PM  Clinical Narrative:     CSW received notification [name] [facility] Inclusion Specialist w/ [facility] [phone], Ext [phone], fax [phone], email: [email or web address], came to see the patient and wanted to speak with me.  CSW called and left voicemail requesting a return call.    Barriers to Discharge: No Barriers Identified  Expected Discharge Plan and Services                                                 Social Determinants of Health (SDOH) Interventions    Readmission Risk Interventions Readmission Risk Prevention Plan 01/11/2020 12/24/2019 01/25/2019  Transportation Screening Complete Complete Complete  PCP or Specialist Appt within 3-5 Days - - -  HRI or Home Care Consult - - -  Social Work Consult for Recovery Care Planning/Counseling - - -  Palliative Care Screening - - -  Medication Review Oceanographer) Complete Complete Complete  PCP or Specialist appointment within 3-5 days of discharge Complete Complete Complete  HRI or Home Care Consult - - Complete  SW Recovery Care/Counseling Consult Complete Complete Complete  Palliative Care Screening Complete Not Applicable Not Applicable  Skilled Nursing Facility Not Applicable Not Applicable Not Applicable

## 2020-07-09 NOTE — TOC Progression Note (Signed)
Transition of Care [facility]) - Progression Note    Patient Details  Name: Giovanie Lefebre MRN: [id number] Date of Birth: 1960-11-28  Transition of Care [facility]) CM/SW Contact  Whiterocks Cellar, RN Phone Number: 07/09/2020, 3:25 PM  Clinical Narrative:    Received call from Delexsine [name] @ [place] [facility]-requesting Fl2 be faxed over in order for [place] [facility] to assist with finding placement. Also requesting assistance with patient getting to court date on 07/11/20. RN CM updated that patient could not leave the ED and return for court however TOC would be able to assist with remote attendance.   Faxed Fl2 to [phone] and received confirmation. LCSW states she will check with DSS attorney regarding needs to arrange remote attendance for patient.       Barriers to Discharge: No Barriers Identified  Expected Discharge Plan and Services                                                 Social Determinants of Health (SDOH) Interventions    Readmission Risk Interventions Readmission Risk Prevention Plan 01/11/2020 12/24/2019 01/25/2019  Transportation Screening Complete Complete Complete  PCP or Specialist Appt within 3-5 Days - - -  HRI or Home Care Consult - - -  Social Work Consult for Recovery Care Planning/Counseling - - -  Palliative Care Screening - - -  Medication Review Oceanographer) Complete Complete Complete  PCP or Specialist appointment within 3-5 days of discharge Complete Complete Complete  HRI or Home Care Consult - - Complete  SW Recovery Care/Counseling Consult Complete Complete Complete  Palliative Care Screening Complete Not Applicable Not Applicable  Skilled Nursing Facility Not Applicable Not Applicable Not Applicable

## 2020-07-09 NOTE — ED Notes (Signed)
Pt given warm wet wash cloth to wash self with at this time

## 2020-07-09 NOTE — ED Notes (Signed)
Pt assisted to toilet in room. Pt able to stand and walk with walker independently in room. Pt now sitting up in recliner, using laptop computer.

## 2020-07-09 NOTE — ED Notes (Signed)
Patient remains a boarder for placement being handled by DSS/APS and CSW.

## 2020-07-09 NOTE — ED Notes (Signed)
meds given   Pt watching tv 

## 2020-07-09 NOTE — ED Notes (Signed)
Pt lying in bed watching tv.  No acute distress.

## 2020-07-10 DIAGNOSIS — F1029 Alcohol dependence with unspecified alcohol-induced disorder: Secondary | ICD-10-CM | POA: Diagnosis not present

## 2020-07-10 DIAGNOSIS — I4891 Unspecified atrial fibrillation: Secondary | ICD-10-CM | POA: Diagnosis not present

## 2020-07-10 NOTE — ED Provider Notes (Signed)
-----------------------------------------   5:45 AM on 07/10/2020 -----------------------------------------   Blood pressure 109/80, pulse 63, temperature 98.3 F (36.8 C), temperature source Oral, resp. rate 20, height 5\' 8"  (1.727 m), weight (!) 186 kg, SpO2 99 %.  The patient is calm and cooperative at this time.  There have been no acute events since the last update.  Awaiting disposition plan from Social Work team.   , MD 07/10/20 [phone]

## 2020-07-10 NOTE — ED Notes (Signed)
Pt given chocolate milk at this time. 

## 2020-07-10 NOTE — ED Notes (Signed)
Pt eating lunch

## 2020-07-10 NOTE — ED Notes (Signed)
Pt given ginger ale and ice °

## 2020-07-10 NOTE — TOC Progression Note (Addendum)
Transition of Care [facility]) - Progression Note    Patient Details  Name: [name] [name] MRN: [id number] Date of Birth: 23-Nov-1960  Transition of Care [facility]) CM/SW Contact  Joseph Art, [place] Phone Number:[phone] 07/10/2020, 1:14 PM  Clinical Narrative:     CSW emailed current placement list to [facility] DSS/APS case worker [name] and requested confirmation on remote court hearing tomorrow 07/11/2020 @ 9:00AM.    Barriers to Discharge: No Barriers Identified  Expected Discharge Plan and Services                                                 Social Determinants of Health (SDOH) Interventions    Readmission Risk Interventions Readmission Risk Prevention Plan 01/11/2020 12/24/2019 01/25/2019  Transportation Screening Complete Complete Complete  PCP or Specialist Appt within 3-5 Days - - -  HRI or Home Care Consult - - -  Social Work Consult for Recovery Care Planning/Counseling - - -  Palliative Care Screening - - -  Medication Review Oceanographer) Complete Complete Complete  PCP or Specialist appointment within 3-5 days of discharge Complete Complete Complete  HRI or Home Care Consult - - Complete  SW Recovery Care/Counseling Consult Complete Complete Complete  Palliative Care Screening Complete Not Applicable Not Applicable  Skilled Nursing Facility Not Applicable Not Applicable Not Applicable

## 2020-07-10 NOTE — ED Notes (Signed)
Pt asleep in bed at this time on cpap

## 2020-07-10 NOTE — ED Notes (Signed)
Messaged pharmacy about missing torsemide dose.

## 2020-07-10 NOTE — ED Notes (Signed)
Pt eating breakfast 

## 2020-07-10 NOTE — ED Notes (Signed)
New bed sheets placed on pt bed

## 2020-07-10 NOTE — ED Notes (Signed)
Pt asleep in bed on cpap at this time

## 2020-07-10 NOTE — ED Notes (Signed)
Pt asleep in bed at this time, on cpap

## 2020-07-10 NOTE — ED Notes (Signed)
Pt asleep in bed at this time, on cpap 

## 2020-07-10 NOTE — ED Notes (Signed)
SW at bedside w/ pt

## 2020-07-11 DIAGNOSIS — F1029 Alcohol dependence with unspecified alcohol-induced disorder: Secondary | ICD-10-CM | POA: Diagnosis not present

## 2020-07-11 DIAGNOSIS — I4891 Unspecified atrial fibrillation: Secondary | ICD-10-CM | POA: Diagnosis not present

## 2020-07-11 NOTE — TOC Progression Note (Addendum)
Transition of Care [facility]) - Progression Note    Patient Details  Name: [name] [name] MRN: [id number] Date of Birth: 09-23-1960  Transition of Care [facility]) CM/SW Contact  Joseph Art, [place] Phone Number: 07/11/2020, 8:16 AM  Clinical Narrative:     CSW called and left voicemail for admissions coordinator Rinaldo Cloud or Victorino Dike at [facility] [phone] ext. [phone], inquiring abut placement possibility for patient. CSW was told by receptionist the admissions coordinator would not return until Monday 07/14/2020.  CSW sent fax with FL2 and most recent PT note to [phone].    Barriers to Discharge: No Barriers Identified  Expected Discharge Plan and Services                                                 Social Determinants of Health (SDOH) Interventions    Readmission Risk Interventions Readmission Risk Prevention Plan 01/11/2020 12/24/2019 01/25/2019  Transportation Screening Complete Complete Complete  PCP or Specialist Appt within 3-5 Days - - -  HRI or Home Care Consult - - -  Social Work Consult for Recovery Care Planning/Counseling - - -  Palliative Care Screening - - -  Medication Review Oceanographer) Complete Complete Complete  PCP or Specialist appointment within 3-5 days of discharge Complete Complete Complete  HRI or Home Care Consult - - Complete  SW Recovery Care/Counseling Consult Complete Complete Complete  Palliative Care Screening Complete Not Applicable Not Applicable  Skilled Nursing Facility Not Applicable Not Applicable Not Applicable

## 2020-07-11 NOTE — TOC Progression Note (Addendum)
Transition of Care [facility]) - Progression Note    Patient Details  Name: Aul Mangieri MRN: [id number] Date of Birth: 08/04/1960  Transition of Care [facility]) CM/SW Contact  [name] Phone Number: [phone] 07/11/2020, 8:43 AM  Clinical Narrative:     CSW was told by [name] [facility] APS [phone], [phone] on Wednesday 07/09/2020 patient was scheduled for court hearing today 07/11/2020 at 9:00AM.  CSW has made attempts to contact Ms. [name] inquiring about confirmation for remote hearing and I have not received a response.       Barriers to Discharge: No Barriers Identified  Expected Discharge Plan and Services                                                 Social Determinants of Health (SDOH) Interventions    Readmission Risk Interventions Readmission Risk Prevention Plan 01/11/2020 12/24/2019 01/25/2019  Transportation Screening Complete Complete Complete  PCP or Specialist Appt within 3-5 Days - - -  HRI or Home Care Consult - - -  Social Work Consult for Recovery Care Planning/Counseling - - -  Palliative Care Screening - - -  Medication Review Oceanographer) Complete Complete Complete  PCP or Specialist appointment within 3-5 days of discharge Complete Complete Complete  HRI or Home Care Consult - - Complete  SW Recovery Care/Counseling Consult Complete Complete Complete  Palliative Care Screening Complete Not Applicable Not Applicable  Skilled Nursing Facility Not Applicable Not Applicable Not Applicable

## 2020-07-11 NOTE — TOC Progression Note (Signed)
Transition of Care [facility]) - Progression Note    Patient Details  Name: [name] [name] MRN: [id number] Date of Birth: August 12, 1960  Transition of Care [facility]) CM/SW Contact  [place] Cellar, RN Phone Number: 07/11/2020, 9:41 AM  Clinical Narrative:    Received call from [name] @ DSS APS confirming patient had signed agreement with DSS yesterday to remain in hospital until placement. Patient is not allowed to discharge home and if patient attempts staff will need to outreach to DSS on call via 911.      Barriers to Discharge: No Barriers Identified  Expected Discharge Plan and Services                                                 Social Determinants of Health (SDOH) Interventions    Readmission Risk Interventions Readmission Risk Prevention Plan 01/11/2020 12/24/2019 01/25/2019  Transportation Screening Complete Complete Complete  PCP or Specialist Appt within 3-5 Days - - -  HRI or Home Care Consult - - -  Social Work Consult for Recovery Care Planning/Counseling - - -  Palliative Care Screening - - -  Medication Review Oceanographer) Complete Complete Complete  PCP or Specialist appointment within 3-5 days of discharge Complete Complete Complete  HRI or Home Care Consult - - Complete  SW Recovery Care/Counseling Consult Complete Complete Complete  Palliative Care Screening Complete Not Applicable Not Applicable  Skilled Nursing Facility Not Applicable Not Applicable Not Applicable

## 2020-07-11 NOTE — Progress Notes (Signed)
Physical Therapy Treatment Patient Details Name: [name] [name] MRN: [id number] DOB: [birth year] Today's Date: 07/11/2020    History of Present Illness Pt is a 60 yo M with recent prior admission and plan to d/c to rehab facility but last minute he refused and went home.  Pt now arrives with blood alcohol >300, falls, reports he has manged at home well but was apparent found in his own feces; admitted with respiratory failure.   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 Pt admitted with acute on chronic RF, ETOH withdrawl, AF c RVR. Rt shoulder MRI done this admission 5/7 revealing "large full-thickness rotator cuff tear with  full-thickness retracted insertional tears of the supraspinatus and  infraspinatus tendons. Both tendons are retracted nearly to the level of the glenoid. The subscapularis and teres minor tendons appear intact".    PT Comments    Pt was pleasant and motivated to participate with bed therex but denied transfers/gait secondary to R knee pain.  Pt put forth good effort with below therex with SpO2 and HR WNL. Nursing notified of pt's 9/10 R knee pain.  Pt will benefit from PT services in a SNF setting upon discharge to safely address deficits listed in patient problem list for decreased caregiver assistance and eventual return to PLOF.    Follow Up Recommendations  SNF     Equipment Recommendations  None recommended by PT    Recommendations for Other Services       Precautions / Restrictions Precautions Precautions: Fall Restrictions Weight Bearing Restrictions: No    Mobility  Bed Mobility               General bed mobility comments: Pt declined secondary to R knee pain    Transfers                    Ambulation/Gait                 Stairs              Wheelchair Mobility    Modified Rankin (Stroke Patients Only)       Balance                                            Cognition Arousal/Alertness: Awake/alert Behavior During Therapy: WFL for tasks assessed/performed Overall Cognitive Status: Within Functional Limits for tasks assessed                                        Exercises Total Joint Exercises Ankle Circles/Pumps: Strengthening;Both;10 reps;15 reps (with manual resistance) Quad Sets: Strengthening;Both;10 reps;5 reps Gluteal Sets: Strengthening;Both;10 reps;5 reps Towel Squeeze: 10 reps;Strengthening;Both;5 reps Short Arc Quad: Strengthening;Both;10 reps;5 reps (with manual resistance) Heel Slides: Strengthening;Both;10 reps;5 reps (with manual resistance) Hip ABduction/ADduction: AROM;Strengthening;Both;10 reps;5 reps Straight Leg Raises: AROM;Strengthening;Both;10 reps;5 reps    General Comments        Pertinent Vitals/Pain Pain Assessment: 0-10 Pain Score: 9  Pain Location: R knee Pain Descriptors / Indicators: Aching;Sore Pain Intervention(s): Premedicated before session;Monitored during session    Home Living  Prior Function            PT Goals (current goals can now be found in the care plan section) Progress towards PT goals: Progressing toward goals    Frequency    Min 2X/week      PT Plan Current plan remains appropriate    Co-evaluation              AM-PAC PT "6 Clicks" Mobility   Outcome Measure  Help needed turning from your back to your side while in a flat bed without using bedrails?: A Little Help needed moving from lying on your back to sitting on the side of a flat bed without using bedrails?: A Little Help needed moving to and from a bed to a chair (including a wheelchair)?: A Little Help needed standing up from a chair using your arms (e.g., wheelchair or bedside chair)?: A Little Help needed  to walk in hospital room?: A Little Help needed climbing 3-5 steps with a railing? : Total 6 Click Score: 16    End of Session Equipment Utilized During Treatment: Oxygen Activity Tolerance: Patient tolerated treatment well Patient left: in bed;with call bell/phone within reach;with bed alarm set Nurse Communication: Mobility status PT Visit Diagnosis: Muscle weakness (generalized) (M62.81);Difficulty in walking, not elsewhere classified (R26.2)     Time: 7412-8786 PT Time Calculation (min) (ACUTE ONLY): 26 min  Charges:  $Therapeutic Exercise: 23-37 mins                     D. Scott Kathia Covington PT, DPT 07/11/20, 5:18 PM

## 2020-07-11 NOTE — ED Notes (Signed)
Pt refused gabapentin. Stated "it doesn't do a damn thing for me"

## 2020-07-11 NOTE — ED Provider Notes (Signed)
6:40 AM   Vitals are normal. Home meds ordered   The patient is calm and cooperative at this time.  There have been no acute events since the last update.  Awaiting disposition plan from Social Work team.   Concha Se, MD 07/11/20 [phone]

## 2020-07-11 NOTE — TOC Progression Note (Signed)
Transition of Care [name]) - Progression Note    Patient Details  Name: [name] [name] MRN: [id number] Date of Birth: 12/24/1960  Transition of Care [facility]) CM/SW Contact  Caldwell Cellar, RN Phone Number: 07/11/2020, 9:30 AM  Clinical Narrative:    RN CM called and left voicemail for [name] [facility] APS [phone] requesting follow up on court date scheduled for today.   Call to supervisor, Julien Nordmann @ DSS [phone], LVMM requesting callback with clarification regarding court date.      Barriers to Discharge: No Barriers Identified  Expected Discharge Plan and Services                                                 Social Determinants of Health (SDOH) Interventions    Readmission Risk Interventions Readmission Risk Prevention Plan 01/11/2020 12/24/2019 01/25/2019  Transportation Screening Complete Complete Complete  PCP or Specialist Appt within 3-5 Days - - -  HRI or Home Care Consult - - -  Social Work Consult for Recovery Care Planning/Counseling - - -  Palliative Care Screening - - -  Medication Review Oceanographer) Complete Complete Complete  PCP or Specialist appointment within 3-5 days of discharge Complete Complete Complete  HRI or Home Care Consult - - Complete  SW Recovery Care/Counseling Consult Complete Complete Complete  Palliative Care Screening Complete Not Applicable Not Applicable  Skilled Nursing Facility Not Applicable Not Applicable Not Applicable

## 2020-07-11 NOTE — TOC Progression Note (Signed)
Transition of Care [facility]) - Progression Note    Patient Details  Name: [name] [name] MRN: [id number] Date of Birth: 1960/09/09  Transition of Care [facility]) CM/SW Contact  [name] Phone Number: [phone] 07/11/2020, 8:06 AM  Clinical Narrative:     CSW called and left [facility] [phone], [phone] to confirm guardianship hearing today 07/11/2020 at 9:00AM.  CSW emailed Ms. [name] requesting hearing confirmation yesterday 07/10/2020.     Barriers to Discharge: No Barriers Identified  Expected Discharge Plan and Services                                                 Social Determinants of Health (SDOH) Interventions    Readmission Risk Interventions Readmission Risk Prevention Plan 01/11/2020 12/24/2019 01/25/2019  Transportation Screening Complete Complete Complete  PCP or Specialist Appt within 3-5 Days - - -  HRI or Home Care Consult - - -  Social Work Consult for Recovery Care Planning/Counseling - - -  Palliative Care Screening - - -  Medication Review Oceanographer) Complete Complete Complete  PCP or Specialist appointment within 3-5 days of discharge Complete Complete Complete  HRI or Home Care Consult - - Complete  SW Recovery Care/Counseling Consult Complete Complete Complete  Palliative Care Screening Complete Not Applicable Not Applicable  Skilled Nursing Facility Not Applicable Not Applicable Not Applicable

## 2020-07-11 NOTE — Progress Notes (Signed)
PT Cancellation Note  Patient Details Name: Jamarcus Laduke MRN: [id number] DOB: 1960-06-19   Cancelled Treatment:    Reason Eval/Treat Not Completed: Other (comment): Attempted to work with pt this date with pt stating that he has been having diarrhea and needed help to BR. Assisted pt to toilet with nursing for a BM.  Will attempt to see pt at a future date/time as medically appropriate.     Ovidio Hanger PT, DPT 07/11/20, 4:27 PM

## 2020-07-12 NOTE — ED Notes (Signed)
Patient reports wounds to bilateral feet, MD notified and wound team consult ordered.

## 2020-07-12 NOTE — ED Notes (Signed)
Provided with ice chips

## 2020-07-12 NOTE — ED Notes (Addendum)
Pt refused to be changed or sheets changed. States hip pain at this time. Urinal emptied. States oral pain meds won't work but takes med Chartered certified accountant. Demands to speak to doctor. Informed pt that MD is busy at this time.

## 2020-07-12 NOTE — ED Notes (Signed)
Patient requesting additional pain medication, patient reports he takes 15mg  oxycodone q 4 hrs at home. He is prescribed 7.5/325mg  Percocet q8 hours according to records from .

## 2020-07-12 NOTE — ED Notes (Signed)
Provided with urinal.

## 2020-07-12 NOTE — ED Notes (Signed)
Pt trying to sleep with bed alarm going off. Alarm adjusted. Pt asks to order breakfast late. Dietary aware. Pt sleeping with bipap in place.

## 2020-07-12 NOTE — ED Notes (Signed)
Patient respirations even, unlabored. NADN. Patient resting with eyes closed, appears to be sleeping. CPAP in place.

## 2020-07-12 NOTE — ED Notes (Signed)
Patient provided with ice, lights dimmed, watching TV

## 2020-07-12 NOTE — ED Notes (Signed)
Patient laying on his left side, Cpap in place. Patient resting with eyes closed, patient appears to be sleeping. Respirations even and unlabored. NADN.

## 2020-07-12 NOTE — ED Notes (Signed)
Report received from [place], [place]. This RN assumed care of patient at this time. Patient lights dim, cpap at bedside.

## 2020-07-12 NOTE — Progress Notes (Signed)
Occupational Therapy Treatment Patient Details Name: [name] [name] MRN: [id number] DOB: May 24, 1960 Today's Date: 07/12/2020    History of present illness Pt is a 60 yo M with recent prior admission and plan to d/c to rehab facility but last minute he refused and went home.  Pt now arrives with blood alcohol >300, falls, reports he has manged at home well but was apparent found in his own feces; admitted with respiratory failure.   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 Pt admitted with acute on chronic RF, ETOH withdrawl, AF c RVR. Rt shoulder MRI done this admission 5/7 revealing "large full-thickness rotator cuff tear with  full-thickness retracted insertional tears of the supraspinatus and  infraspinatus tendons. Both tendons are retracted nearly to the level of the glenoid. The subscapularis and teres minor tendons appear intact".   OT comments  Pt seen for OT tx this date to f/u re: safety with ADLs/ADL mobility. Pt with c/o his usual joint pain, but also alludes to pain on the back sides of his thighs. OT notes moisture in the bed and general decreased hygiene and pt agreeable to getting OOB for assistance with tending to his skin needs. OT engages pt in LB bathing with MAX A in standing (pt able to sustain static standing with UE support with SBA/CGA, but requires MAX A for posterior LB bathing d/t shld ROM limitations and limited by body habitus), g/h tasks for posterior LB in standing including application of powder to reduce moisture and further excoriation/breakdown, SPS transfer with SUPV from EOB to chair. In chair, OT engages pt in 2 sets x15 reps contralateral reaching to improve core control for breathing and fall prevention as well as QS x10 each side, alternating with cues for form, face and technique.  Pt tolerates session well. On 4Lnc throughout. Will continue to follow. Continue to anticipate that pt will require STR f/u OT services in SNF Setting upon d/c.   Follow Up Recommendations  SNF    Equipment Recommendations  Other (comment) (defer to next venue of care)    Recommendations for Other Services      Precautions / Restrictions Precautions Precautions: Fall Restrictions Weight Bearing Restrictions: No       Mobility Bed Mobility Overal bed mobility: Needs Assistance Bed Mobility: Supine to Sit     Supine to sit: Min guard;Min assist;HOB elevated     General bed mobility comments: increased time    Transfers Overall transfer level: Needs assistance Equipment used: Rolling walker (2 wheeled) Transfers: Stand Pivot Transfers   Stand pivot transfers: Supervision       General transfer comment: SUPV to SPS from EOB to recliner, initially with bari RW for UE support and then ultimately performs with use of chair arm rests to support and pivot.    Balance Overall balance assessment: History of Falls;Needs assistance Sitting-balance support: Feet supported Sitting balance-Leahy Scale: Good Sitting balance - Comments: G static sitting   Standing balance support: Bilateral upper extremity supported Standing balance-Leahy Scale: Fair Standing balance comment: at least unilateral support with HHA although his posture and performance are better with B UE support on RW.                           ADL either performed or assessed with clinical judgement  ADL Overall ADL's : Needs assistance/impaired       Grooming Details (indicate cue type and reason): MAX A to apply skin protection powder to excoriated posterior aspect of upper thighs in standing. Pt able to sustain standing baland with UE support and CGA/SBA, but limited ability to reach his posterior d/t body habitus and limited shoulder ROM 2/2 pain/arthritis     Lower Body Bathing: Maximal  assistance;Sit to/from stand Lower Body Bathing Details (indicate cue type and reason): MAX A for posterior standing LB bathing d/t limited reach 2/2 shoulder pain and limited by body habitus                             Vision Patient Visual Report: No change from baseline     Perception     Praxis      Cognition Arousal/Alertness: Awake/alert Behavior During Therapy: WFL for tasks assessed/performed Overall Cognitive Status: Within Functional Limits for tasks assessed                                 General Comments: Pt verbose, talkative t/o session, A and O x 4        Exercises Other Exercises Other Exercises: OT engages pt in LB bathing, g/h tasks for posterior LB in standing, SPS transfer with SUPV from EOB to chair. Other Exercises: In chair, OT engages pt in 2 sets x15 reps contralateral reaching to improve core control for breathing and fall prevention as well as QS x10 each side, alternating with cues for form, face and technique.   Shoulder Instructions       General Comments      Pertinent Vitals/ Pain       Pain Assessment: 0-10 Pain Score: 9  Pain Location: R hip and knee, bottom, LE sores Pain Descriptors / Indicators: Aching;Sore;Tender Pain Intervention(s): Limited activity within patient's tolerance;Monitored during session;Repositioned;Other (comment) (assisted with cleansing skin and applying protective powder to excoriated areas to reduce shearing from moisture and reduce further breakdown)  Home Living                                          Prior Functioning/Environment              Frequency  Min 1X/week        Progress Toward Goals  OT Goals(current goals can now be found in the care plan section)  Progress towards OT goals: Progressing toward goals  Acute Rehab OT Goals Patient Stated Goal: Pt wants to go home OT Goal Formulation: With patient Time For Goal Achievement:  07/18/20 Potential to Achieve Goals: Good  Plan Discharge plan remains appropriate    Co-evaluation                 AM-PAC OT "6 Clicks" Daily Activity     Outcome Measure   Help from another person eating meals?: A Little Help from another person taking care of personal grooming?: A Little Help from another person toileting, which includes using toliet, bedpan, or urinal?: A Lot Help from another person bathing (including washing, rinsing, drying)?: A Lot Help from another person to put on and taking off regular upper body clothing?: A Little Help from another person to put on and taking off regular lower body clothing?:  A Lot 6 Click Score: 15    End of Session Equipment Utilized During Treatment: Rolling walker;Gait belt;Oxygen  OT Visit Diagnosis: Unsteadiness on feet (R26.81);Repeated falls (R29.6);Muscle weakness (generalized) (M62.81)   Activity Tolerance Patient limited by fatigue;Patient limited by pain   Patient Left in chair;with call bell/phone within reach   Nurse Communication Mobility status;Other (comment) (notified RN that OT would be temporarily be taking pt outside for therapeutic listening as pt has not seen the outside world since this admission as no windows in the ED. RN agreeable.)        Time: 7096-2836 OT Time Calculation (min): 72 min  Charges: OT General Charges $OT Visit: 1 Visit OT Treatments $Self Care/Home Management : 23-37 mins $Therapeutic Activity: 8-22 mins $Therapeutic Exercise: 23-37 mins  Rejeana Brock, MS, OTR/L ascom [phone] 07/12/20, 2:38 PM

## 2020-07-12 NOTE — ED Notes (Signed)
Pt resting in NAD. Call bell at bedside.

## 2020-07-12 NOTE — ED Notes (Signed)
Provided with 2 chocolate ice creams per patient request.

## 2020-07-12 NOTE — ED Notes (Signed)
PT at bedside.

## 2020-07-12 NOTE — ED Notes (Signed)
Patient returned to room, sitting up in recliner.

## 2020-07-12 NOTE — ED Notes (Signed)
OT taking pt outside. Meds requested from pharmacy.

## 2020-07-12 NOTE — ED Notes (Signed)
Milk provided, emptied from urinal x2, no other needs at this time.

## 2020-07-12 NOTE — ED Notes (Signed)
Pt remains resting. Will continue to monitor.

## 2020-07-12 NOTE — ED Notes (Signed)
Patient under DSS/APS hearing was to be held 5/27 no documentation from hearing, pending placement by APS/CSW

## 2020-07-12 NOTE — ED Notes (Signed)
Patient is resting comfortably. Patient appears to be sleeping. Respirations even and unlabored.

## 2020-07-12 NOTE — ED Notes (Signed)
VOL, pending CSW, DSS/APS placement boarder

## 2020-07-12 NOTE — ED Notes (Signed)
Patient assisted with putting clean sheets on bed at this time, now laying in bed and watching TV

## 2020-07-12 NOTE — ED Notes (Signed)
Patient with OT outside, housekeeping cleaning room.

## 2020-07-12 NOTE — ED Notes (Signed)
Provided with clean wash cloths and soap for patient to cleanse himself.

## 2020-07-12 NOTE — ED Notes (Signed)
Patient provided with 3 cartons of 2% Milk

## 2020-07-12 NOTE — ED Notes (Addendum)
[employer] delivery person dropped off pizza for patient to room, patient called and paid for pizza by himself.

## 2020-07-13 DIAGNOSIS — I4891 Unspecified atrial fibrillation: Secondary | ICD-10-CM | POA: Diagnosis not present

## 2020-07-13 DIAGNOSIS — F1029 Alcohol dependence with unspecified alcohol-induced disorder: Secondary | ICD-10-CM | POA: Diagnosis not present

## 2020-07-13 NOTE — ED Notes (Signed)
Pt resting in bed watching TV. Denies any needs at this time. Will continue to monitor. Call light within reach.

## 2020-07-13 NOTE — ED Notes (Signed)
Pt resting quietly with eyes closed. NAD. VSS. Call light within reach. Will continue to monitor for changes.

## 2020-07-13 NOTE — ED Notes (Signed)
Pt resting with bipap in place.

## 2020-07-13 NOTE — Progress Notes (Signed)
Pt self initiates Bipap when he is ready for sleep

## 2020-07-13 NOTE — ED Provider Notes (Signed)
Emergency Medicine Observation Re-evaluation Note  Anil Havard is a 60 y.o. male, seen on rounds today.  Pt initially presented to the ED for complaints of Weakness Currently, the patient is resting comfortably watching tv, denies any acute complaints.  Physical Exam  BP (!) 136/94 (BP Location: Right Arm)   Pulse 93   Temp 98 F (36.7 C) (Oral)   Resp 17   Ht 5\' 8"  (1.727 m)   Wt (!) 186 kg   SpO2 100%   BMI 62.34 kg/m  Physical Exam General: no acute distress Cardiac: normal rate Lungs: equal chest rise Psych: calm  ED Course / MDM  EKG:EKG Interpretation  Date/Time:  Wednesday Jul 02 2020 13:17:08 EDT Ventricular Rate:  115 PR Interval:    QRS Duration: 134 QT Interval:  286 QTC Calculation: 395 R Axis:   66 Text Interpretation: Atrial fibrillation with rapid ventricular response Non-specific intra-ventricular conduction block T wave abnormality, consider inferior ischemia Abnormal ECG Confirmed by UNCONFIRMED, DOCTOR (08-31-1971), editor [id number], [name] [phone]) on 07/02/2020 2:28:15 PM   I have reviewed the labs performed to date as well as medications administered while in observation.  Recent changes in the last 24 hours include none.  Plan  Current plan is for social work to dispo. Patient is not under full IVC at this time.   07/04/2020, MD 07/13/20 [phone]

## 2020-07-14 LAB — CBC
HCT: 36.7 % — ABNORMAL LOW (ref 39.0–52.0)
Hemoglobin: 11.7 g/dL — ABNORMAL LOW (ref 13.0–17.0)
MCH: 31.3 pg (ref 26.0–34.0)
MCHC: 31.9 g/dL (ref 30.0–36.0)
MCV: 98.1 fL (ref 80.0–100.0)
Platelets: 193 10*3/uL (ref 150–400)
RBC: 3.74 MIL/uL — ABNORMAL LOW (ref 4.22–5.81)
RDW: 15.9 % — ABNORMAL HIGH (ref 11.5–15.5)
WBC: 7.2 10*3/uL (ref 4.0–10.5)
nRBC: 0 % (ref 0.0–0.2)

## 2020-07-14 MED ORDER — ALPRAZOLAM 0.5 MG PO TABS
0.5000 mg | ORAL_TABLET | Freq: Once | ORAL | Status: AC
  Administered 2020-07-14: 0.5 mg via ORAL
  Filled 2020-07-14: qty 1

## 2020-07-14 NOTE — ED Notes (Signed)
Pt requesting something to help him relax. MD made aware. No new orders at this time.

## 2020-07-14 NOTE — ED Notes (Signed)
Patient placed his Cpap on by himself and is now sleeping.  Respirations even and unlabored.  No acute distress noted.

## 2020-07-14 NOTE — ED Notes (Signed)
Patient up to toilet, 1 large BM.  Patient unable to wipe himself, this RN assisted.

## 2020-07-14 NOTE — ED Notes (Signed)
Pending Placement by DSS/ APS

## 2020-07-14 NOTE — ED Notes (Signed)
Pt ambulatory to bedside recliner. Call bell and urinal in reach. Pt denies any further needs at this time.

## 2020-07-14 NOTE — ED Notes (Signed)
Patient inappropriate toward this RN saying that "You should be scared of the [facility] coming to get you.  If I don't like where they place me, I'll just come back and the [facility] would take me [place] here."  Delusions of grandeur.

## 2020-07-14 NOTE — Consult Note (Signed)
WOC Nurse Consult Note: Pt is familiar to WOC team from multiple previous admissions. He wears bilat Una boots which are chaged twice a week. Removed Una boots; right anterior shin with partial thickness skin loss, approx 3X3X.1cm, red and moist. Left shin with intact skin, dry and peeling.  Bilat legs with generalized edema, no open wounds or drainage. Legs remain with darker-colored skin with chronic venous stasis changes.  Dressing procedure/placement/frequency: Applied bilat Una boots and coban. WOC will plan to change dressings to legs Fri if he is still in the hospital at that time. Cammie Mcgee MSN, RN, CWOCN, [place], CNS [phone]

## 2020-07-14 NOTE — ED Notes (Signed)
Pt given meal tray.

## 2020-07-14 NOTE — Progress Notes (Signed)
Pt self initiates Bipap when he is ready for sleep 

## 2020-07-14 NOTE — ED Notes (Signed)
Pt given breakfast tray

## 2020-07-15 NOTE — ED Notes (Signed)
Bed changed, set patient chair for lunch,

## 2020-07-15 NOTE — ED Notes (Signed)
Pt refused all 1000 medications

## 2020-07-15 NOTE — ED Notes (Signed)
Patient is resting comfortably.No acute distress noted.

## 2020-07-15 NOTE — ED Notes (Signed)
Pt resting with family in room

## 2020-07-15 NOTE — ED Notes (Signed)
Patient is resting comfortably. 

## 2020-07-15 NOTE — ED Notes (Signed)
Pt resting in room call light in reach

## 2020-07-15 NOTE — ED Notes (Signed)
PENDING  PLACEMENT  BY  DSS

## 2020-07-15 NOTE — ED Notes (Signed)
This RN attempted to wake pt for medication administration. Pt stating he wants to continue sleeping and will take the medicine when he is ready to wake up.

## 2020-07-15 NOTE — Progress Notes (Signed)
PT Cancellation Note  Patient Details Name: [name] [name] MRN: [id number] DOB: 1960-08-16   Cancelled Treatment:    Reason Eval/Treat Not Completed: Patient declined, no reason specified Attempted to see pt this AM, he had CPAP on and though he opened his eyes and acknowledged the presence of the PT (known to this patient from many prior admits/treatment sessions) he refused PT at this time.  Difficult to understand exact "reason" 2/2 CPAP/noise, but clearly not interested in working with PT and wanting to remain resting.  Will try back as time allows.  Malachi Pro, DPT 07/15/2020, 10:59 AM

## 2020-07-16 NOTE — ED Notes (Signed)
Vol pending placement last update 5/27

## 2020-07-16 NOTE — ED Notes (Signed)
Pt resting in bed with eyes closed, chest rise and fall observed, respirations regular and unlabored. Bed in low position with wheels locked, call light and personal belongings in reach.

## 2020-07-16 NOTE — Progress Notes (Signed)
Physical Therapy Treatment Patient Details Name: [name] [name] MRN: [id number] DOB: 01/26/1961 Today's Date: 07/16/2020    History of Present Illness Pt is a 60 yo M with recent prior admission and plan to d/c to rehab facility but last minute he refused and went home.  Pt now arrives with blood alcohol >300, falls, reports he has manged at home well but was apparent found in his own feces; admitted with respiratory failure.   PMH: morbid obesity, CRF, obesity hypoventilation syndrome, HTN, depression, GAD, ETOH abuse, left deafness since age of 52, COPD, nonischemic cardiomyopathy, HF c reduced EF, PAF, OSA, DVT, PE, GERD, hypoTSH, HLD, LEE. Pt admitted with acute on chronic RF, ETOH withdrawl, AF c RVR. Rt shoulder MRI done this admission 5/7 revealing "large full-thickness rotator cuff tear with  full-thickness retracted insertional tears of the supraspinatus and  infraspinatus tendons. Both tendons are retracted nearly to the level of the glenoid. The subscapularis and teres minor tendons appear intact".    PT Comments    Pt did as well with mobility, ambulation and tolerance with resisted exercises as this PT has seen.  He seemed to be in relatively good spirits and participated well with PT session.  He needed only very minimal assist with mobility/transfers and was able to ambulate ~75 ft safely and with confidence.  On 3L t/o the session, O2 did drop briefly into the 80s, but stayed in the mid 90s most of the time.      Follow Up Recommendations  SNF     Equipment Recommendations  None recommended by PT    Recommendations for Other Services       Precautions / Restrictions Precautions Precautions: Fall Restrictions Weight Bearing Restrictions: No    Mobility  Bed Mobility Overal bed mobility: Needs Assistance Bed Mobility: Supine to Sit     Supine to sit: Min assist;Min guard     General bed mobility comments: increased time, however he did a vast majority of the  effort without assist    Transfers Overall transfer level: Needs assistance Equipment used: Rolling walker (2 wheeled) Transfers: Sit to/from Stand Sit to Stand: Min guard         General transfer comment: Pt able to do multiple sit to stand bouts this session without direct assist.  Ambulation/Gait Ambulation/Gait assistance: Min guard Gait Distance (Feet): 75 Feet Assistive device: Rolling walker (2 wheeled)       General Gait Details: Pt did as well walking today as this PT has seen him do during many visits over the last 1-2 years.  He was reliant on walker and had external rotated LEs per his usual, however he was able to maintain relatively consistent cadence and good overall effort.  He did endorse signficant fatigue with this distance but on 3L but was able to maintain O2 in the 90s despite some minimal shortness of breath.   Stairs             Wheelchair Mobility    Modified Rankin (Stroke Patients Only)       Balance Overall balance assessment: Needs assistance   Sitting balance-Leahy Scale: Good     Standing balance support: Bilateral upper extremity supported Standing balance-Leahy Scale: Fair                              Cognition Arousal/Alertness: Awake/alert Behavior During Therapy: WFL for tasks assessed/performed Overall Cognitive Status: Within Functional Limits for tasks  assessed                                        Exercises General Exercises - Lower Extremity Ankle Circles/Pumps: AROM;Strengthening;10 reps Short Arc Quad: Strengthening;10 reps Heel Slides: Strengthening;10 reps (with resisted leg ext) Hip ABduction/ADduction: Strengthening;10 reps Straight Leg Raises: AROM;5 reps    General Comments        Pertinent Vitals/Pain Pain Assessment:  (reports typical chronic pain, but does not nearly have the pain response with activity as he often does)    Home Living                       Prior Function            PT Goals (current goals can now be found in the care plan section) Progress towards PT goals: Progressing toward goals    Frequency    Min 2X/week      PT Plan Current plan remains appropriate    Co-evaluation              AM-PAC PT "6 Clicks" Mobility   Outcome Measure  Help needed turning from your back to your side while in a flat bed without using bedrails?: A Little Help needed moving from lying on your back to sitting on the side of a flat bed without using bedrails?: A Little Help needed moving to and from a bed to a chair (including a wheelchair)?: A Little Help needed standing up from a chair using your arms (e.g., wheelchair or bedside chair)?: A Little Help needed to walk in hospital room?: A Little Help needed climbing 3-5 steps with a railing? : A Lot 6 Click Score: 17    End of Session Equipment Utilized During Treatment: Oxygen (3L) Activity Tolerance: Patient tolerated treatment well Patient left: with call bell/phone within reach;in chair   PT Visit Diagnosis: Muscle weakness (generalized) (M62.81);Difficulty in walking, not elsewhere classified (R26.2)     Time: 7591-6384 PT Time Calculation (min) (ACUTE ONLY): 32 min  Charges:  $Gait Training: 8-22 mins $Therapeutic Exercise: 8-22 mins                     Malachi Pro, DPT 07/16/2020, 2:08 PM

## 2020-07-16 NOTE — ED Notes (Signed)
This NT supplied pt with bathing supplies per and packed by  RN Victorino Dike.

## 2020-07-16 NOTE — ED Notes (Signed)
Assisted pt with linen change, belonging positioning, and transfer back to bed.  Pt requested "bucket for sponge bath." RN Victorino Dike notified.

## 2020-07-16 NOTE — ED Notes (Signed)
Pt requests sponge bath, I discussed with pt that we could utilize the shower in the ED and I would assist him with this. He states this will not work for him, offered to set up supplies for patient to give himself a sponge bath and he again declines.

## 2020-07-16 NOTE — ED Notes (Signed)
Bed linens changed, pt given supplies to sponge bathe at bedside, states he wants to get back in bed for now. Assisted back to bed. Bed locked in low position with side rails up x2, call light and personal belongings in reach.

## 2020-07-16 NOTE — ED Notes (Signed)
Pt assisted from bed to bedside recliner. Resting comfortably in recliner, watching TV. Call light and personal belongings in reach.

## 2020-07-16 NOTE — ED Notes (Signed)
VOL  PENDING  PLACEMENT 

## 2020-07-16 NOTE — ED Notes (Signed)
Pt provided with ice cream at his request

## 2020-07-16 NOTE — ED Notes (Signed)
Pt given bin of warm water and soap, wash clothes, and fresh gown to give self bed bath. Pt denies further needs at this time

## 2020-07-17 NOTE — ED Notes (Signed)
Pt remains sleeping with cpap in place

## 2020-07-17 NOTE — TOC Progression Note (Signed)
Transition of Care [facility]) - Progression Note    Patient Details  Name: Cleaven Demario MRN: [id number] Date of Birth: [birth year]  [facility] CM/SW Contact  Hetty Ely, RN Phone Number: 07/17/2020, 9:49 AM  Clinical Narrative:  Attempted to call [facility] [place], [phone] to inquire about admission referral, no answer left voice message for Pam Spruill to review referral and return call with decision.       Barriers to Discharge: No Barriers Identified  Expected Discharge Plan and Services                                                 Social Determinants of Health (SDOH) Interventions    Readmission Risk Interventions Readmission Risk Prevention Plan 01/11/2020 12/24/2019 01/25/2019  Transportation Screening Complete Complete Complete  PCP or Specialist Appt within 3-5 Days - - -  HRI or Home Care Consult - - -  Social Work Consult for Recovery Care Planning/Counseling - - -  Palliative Care Screening - - -  Medication Review Oceanographer) Complete Complete Complete  PCP or Specialist appointment within 3-5 days of discharge Complete Complete Complete  HRI or Home Care Consult - - Complete  SW Recovery Care/Counseling Consult Complete Complete Complete  Palliative Care Screening Complete Not Applicable Not Applicable  Skilled Nursing Facility Not Applicable Not Applicable Not Applicable

## 2020-07-17 NOTE — ED Notes (Signed)
Pt applied cpap for sleep.

## 2020-07-17 NOTE — ED Notes (Signed)
Patient provided with 2 cartons of chocolate milk per request

## 2020-07-17 NOTE — ED Notes (Signed)
VOL  PENDING  PLACEMENT 

## 2020-07-17 NOTE — ED Notes (Signed)
Pt offered supplies to wash up, pt states he does not need to right now.

## 2020-07-17 NOTE — ED Provider Notes (Signed)
-----------------------------------------   6:12 AM on 07/17/2020 -----------------------------------------   Blood pressure 106/60, pulse 86, temperature 98.4 F (36.9 C), temperature source Oral, resp. rate 18, height 5\' 8"  (1.727 m), weight (!) 186 kg, SpO2 100 %.  The patient is calm and cooperative at this time.  There have been no acute events since the last update.  Awaiting disposition plan from Social Work team.   , MD 07/17/20 [phone]

## 2020-07-18 NOTE — Consult Note (Signed)
WOC Nurse Consult Note: Pt wears bilat Una boots which are chaged twice a week.Removed Una boots; previously noted right anterior shin with partial thickness skin loss has healed. No open wound or drainage to bilat legs.  Remains with generalized edema, legs remainwith darker-colored skin with chronic venous stasis changes.  Dressing procedure/placement/frequency:Applied bilat Una boots and coban. WOC team will plan to change dressings to legs Mon if he is still in the hospital at that time. Cammie Mcgee MSN, RN, CWOCN, [place], CNS [phone]

## 2020-07-18 NOTE — ED Provider Notes (Signed)
Emergency Medicine Observation Re-evaluation Note  [name] [name] is a 60 y.o. male, seen on rounds today.  Pt initially presented to the ED for complaints of Weakness Currently, the patient is resting comfortably.  Physical Exam  BP 96/60   Pulse 86   Temp 98.4 F (36.9 C) (Oral)   Resp 18   Ht 5\' 8"  (1.727 m)   Wt (!) 186 kg   SpO2 100%   BMI 62.34 kg/m  Physical Exam Gen: No acute distress  Resp: Normal rise and fall of chest Neuro: Moving all four extremities Psych: Resting currently, calm and cooperative when awake    ED Course / MDM  EKG:EKG Interpretation  Date/Time:  Wednesday Jul 02 2020 13:17:08 EDT Ventricular Rate:  115 PR Interval:    QRS Duration: 134 QT Interval:  286 QTC Calculation: 395 R Axis:   66 Text Interpretation: Atrial fibrillation with rapid ventricular response Non-specific intra-ventricular conduction block T wave abnormality, consider inferior ischemia Abnormal ECG Confirmed by UNCONFIRMED, DOCTOR (08-31-1971), editor [id number], [name] [phone]) on 07/02/2020 2:28:15 PM   I have reviewed the labs performed to date as well as medications administered while in observation.  Recent changes in the last 24 hours include no acute events overnight.  Plan  Current plan is for social work disposition.    Naftoli Penny, 07/04/2020, DO 07/18/20 [phone]

## 2020-07-18 NOTE — TOC Progression Note (Signed)
Transition of Care [facility]) - Progression Note    Patient Details  Name: [name] [name] MRN: [id number] Date of Birth: 06/22/1960  [facility]) CM/SW Contact  [place], RN Phone Number: 07/18/2020, 10:09 AM  Clinical Narrative:    LVMM for Aletta Edouard @  [facility] [place], [phone] to inquire about admission referral.      Barriers to Discharge: No Barriers Identified  Expected Discharge Plan and Services                                                 Social Determinants of Health (SDOH) Interventions    Readmission Risk Interventions Readmission Risk Prevention Plan 01/11/2020 12/24/2019 01/25/2019  Transportation Screening Complete Complete Complete  PCP or Specialist Appt within 3-5 Days - - -  HRI or Home Care Consult - - -  Social Work Consult for Recovery Care Planning/Counseling - - -  Palliative Care Screening - - -  Medication Review (RN Care Manager) Complete Complete Complete  PCP or Specialist appointment within 3-5 days of discharge Complete Complete Complete  HRI or Home Care Consult - - Complete  SW Recovery Care/Counseling Consult Complete Complete Complete  Palliative Care Screening Complete Not Applicable Not Applicable  Skilled Nursing Facility Not Applicable Not Applicable Not Applicable

## 2020-07-18 NOTE — ED Notes (Signed)
Pt resting in bedside recliner. Call light and personal belongings in reach. Given ginger ale per his request, denies further needs at this time.

## 2020-07-18 NOTE — ED Notes (Signed)
Pt able to go from recliner to bed with no assistance, RN present at bedside. Resting in bed, bed locked in low position with side rails up x2, call light and personal belongings in reach.

## 2020-07-18 NOTE — Evaluation (Signed)
Physical Therapy Re-Evaluation Patient Details Name: [name] [name] MRN: [id number] DOB: 04-04-1960 Today's Date: 07/18/2020   History of Present Illness  Pt is a 60 yo M with recent prior admission and plan to d/c to rehab facility but last minute he refused and went home.  Pt now arrives with blood alcohol >300, falls, reports he has manged at home well but was apparent found in his own feces; admitted with respiratory failure.   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 Pt admitted with acute on chronic RF, ETOH withdrawl, AF c RVR. Rt shoulder MRI done this admission 5/7 revealing "large full-thickness rotator cuff tear with  full-thickness retracted insertional tears of the supraspinatus and  infraspinatus tendons. Both tendons are retracted nearly to the level of the glenoid. The subscapularis and teres minor tendons appear intact".    Clinical Impression  Pt was pleasant and motivated to participate during the session. Pt put forth good effort with therex and sit to/from stand transfer training and was able to amb 75+ feet with occasional short standing therapeutic rest breaks. Pt ambulated with slow cadence with trunk somewhat flexed but improved with cuing and pt was generally steady without LOB or knee buckling. Pt's SpO2 and HR were both WNL on 4LO2/min during gait training.  Pt will benefit from PT services in a SNF setting upon discharge to safely address deficits listed in patient problem list for decreased caregiver assistance and eventual return to PLOF.      Follow Up Recommendations SNF    Equipment Recommendations  None recommended by PT    Recommendations for Other Services       Precautions / Restrictions Precautions Precautions: Fall Restrictions Weight Bearing Restrictions: No       Mobility  Bed Mobility               General bed mobility comments: Deferred, pt in recliner    Transfers Overall transfer level: Needs assistance Equipment used: Rolling walker (2 wheeled) Transfers: Sit to/from Stand Sit to Stand: Supervision         General transfer comment: Fair eccentric and concentric control and stability  Ambulation/Gait Ambulation/Gait assistance: Min guard Gait Distance (Feet): 75 Feet Assistive device: Rolling walker (2 wheeled) Gait Pattern/deviations: Step-through pattern;Decreased step length - right;Decreased step length - left;Trunk flexed Gait velocity: decreased   General Gait Details: Slow but consistent cadence with short B step length, steady with no LOB or buckling  Stairs            Wheelchair Mobility    Modified Rankin (Stroke Patients Only)       Balance Overall balance assessment: Needs assistance Sitting-balance support: Feet supported Sitting balance-Leahy Scale: Good     Standing balance support: Bilateral upper extremity supported;During functional activity Standing balance-Leahy Scale: Fair Standing balance comment: Min to mod lean on the RW for support                             Pertinent Vitals/Pain Pain Assessment: 0-10 Pain Score: 8  Pain Location: General pain including neck and RLE Pain Descriptors / Indicators: Sore;Aching Pain Intervention(s): Premedicated before session;Monitored during session;Repositioned    Home Living Family/patient expects to be discharged to:: Private residence Living Arrangements: Alone Available Help at Discharge: Personal care attendant;Available PRN/intermittently (PCA  intermittently 3x/wk) Type of Home: Apartment Home Access: Level entry     Home Layout: One level Home Equipment: Walker - 4 wheels;Shower seat;Hand held shower head;Wheelchair - power Additional Comments: Sleeps in regular bed, lift chair    Prior Function Level of  Independence: Needs assistance   Gait / Transfers Assistance Needed: Bariatric rollator and scooter for functional mobility, more difficulty with amb recently, several recent hospital admissions  ADL's / Homemaking Assistance Needed: Pt reports having an aide that comes 3x week to assist with self care tasks such as shower as well as IADLs  Comments: Pt reports having transportation for appointments     Hand Dominance   Dominant Hand: Right    Extremity/Trunk Assessment   Upper Extremity Assessment Upper Extremity Assessment: Generalized weakness;RUE deficits/detail RUE Deficits / Details: Decreased AROM shoulder flexion to <90; painful with movement 2/2 rotator cuff tear.    Lower Extremity Assessment Lower Extremity Assessment: Generalized weakness       Communication   Communication: No difficulties  Cognition Arousal/Alertness: Awake/alert Behavior During Therapy: WFL for tasks assessed/performed Overall Cognitive Status: Within Functional Limits for tasks assessed                                        General Comments      Exercises Total Joint Exercises Ankle Circles/Pumps: Strengthening;Both;10 reps;15 reps Long Arc Quad: Strengthening;Both;10 reps (with manual resistance) Knee Flexion: Strengthening;10 reps;Both (with manual resistance)   Assessment/Plan    PT Assessment Patient needs continued PT services  PT Problem List Decreased strength;Decreased activity tolerance;Decreased balance;Decreased mobility;Decreased knowledge of use of DME       PT Treatment Interventions DME instruction;Gait training;Functional mobility training;Therapeutic activities;Therapeutic exercise;Balance training;Patient/family education    PT Goals (Current goals can be found in the Care Plan section)  Acute Rehab PT Goals Patient Stated Goal: To get stronger and return home PT Goal Formulation: With patient Time For Goal Achievement: 07/31/20 Potential to  Achieve Goals: Fair    Frequency Min 2X/week   Barriers to discharge Inaccessible home environment;Decreased caregiver support      Co-evaluation               AM-PAC PT "6 Clicks" Mobility  Outcome Measure Help needed turning from your back to your side while in a flat bed without using bedrails?: A Little Help needed moving from lying on your back to sitting on the side of a flat bed without using bedrails?: A Little Help needed moving to and from a bed to a chair (including a wheelchair)?: A Little Help needed standing up from a chair using your arms (e.g., wheelchair or bedside chair)?: A Little Help needed to walk in hospital room?: A Little Help needed climbing 3-5 steps with a railing? : A Lot 6 Click Score: 17    End of Session Equipment Utilized During Treatment: Oxygen Activity Tolerance: Patient tolerated treatment well Patient left: with call bell/phone within reach;in chair Nurse Communication: Mobility status PT Visit Diagnosis: Muscle weakness (generalized) (M62.81);Difficulty in walking, not elsewhere classified (R26.2)    Time: 7253-6644 PT Time Calculation (min) (ACUTE ONLY): 24 min   Charges:   PT Evaluation $PT Re-evaluation: 1 Re-eval PT Treatments $Gait Training: 8-22 mins        D. Scott Dejanee Thibeaux PT, DPT 07/18/20, 4:10 PM

## 2020-07-18 NOTE — Progress Notes (Signed)
Pt was on and off Bipap throughout the night. Pt was off this A.M. and tol well.

## 2020-07-18 NOTE — ED Notes (Signed)
Pt assisted from bed to bedside recliner, call light and personal belongings in reach. Medicated for pain, see mar. Pt denies further needs at this time.

## 2020-07-18 NOTE — Progress Notes (Signed)
OT Cancellation Note  Patient Details Name: [name] [name] MRN: [id number] DOB: [birth year]   Cancelled Treatment:    Reason Eval/Treat Not Completed: Patient declined, no reason specified  Pt laying in bed when OT presented around ~1230PM. Pt reporting he didn't get much sleep over night d/t severe R hip pain. Pt declines to participate in therapy at this time, but is agreeable to being checked on later today. Will attempt to f/u as able. Thank you.  Rejeana Brock, MS, OTR/L ascom [phone] 07/18/20, 2:39 PM

## 2020-07-18 NOTE — ED Notes (Signed)
VOL/ Pending Placement SNF when available

## 2020-07-19 NOTE — Progress Notes (Signed)
OT Cancellation Note  Patient Details Name: [name] [name] MRN: [id number] DOB: [birth year]   Cancelled Treatment:    Reason Eval/Treat Not Completed: Patient declined, no reason specified  Pt laying bed with lights off when OT presents. Extended time spent attempting to encourage pt to get OOB or participate in OT on some level. Pt continually declines stating he doesn't get moving until after lunch. OT will f/u at later date/time as able. Thank you.   Rejeana Brock, MS, OTR/L ascom [phone] 07/19/20, 10:07 AM

## 2020-07-19 NOTE — ED Notes (Signed)
Report received, care of pt assumed.  Pt resting in bed, eyes closed, resp even an non labored, CPaP in place.  PT resting quietly, will continue to monitor.

## 2020-07-19 NOTE — ED Provider Notes (Signed)
-----------------------------------------   6:23 AM on 07/19/2020 -----------------------------------------   Blood pressure (!) 97/44, pulse 67, temperature 97.9 F (36.6 C), resp. rate 18, height 5\' 8"  (1.727 m), weight (!) 186 kg, SpO2 100 %.  The patient is calm and cooperative at this time.  There have been no acute events since the last update.  Awaiting disposition plan from Social Work team.   , MD 07/19/20 [phone]

## 2020-07-19 NOTE — ED Notes (Signed)
Received report Jonny Ruiz, Charity fundraiser

## 2020-07-19 NOTE — ED Notes (Signed)
To room to provide AM meds, pt continues to sleep soundly, CPaP in place, resp even and non labored.  AM meds deferred at this time until pt wakes.  Will continue to monitor.

## 2020-07-20 NOTE — ED Notes (Signed)
Pt in bedside recliner. Pt provided ginger ale per request.

## 2020-07-20 NOTE — ED Notes (Signed)
Lights dimmed for comfort at this time. No acute distress noted. Pt lying on his left side at this time.

## 2020-07-20 NOTE — ED Notes (Signed)
Pt given x3 cartons of 2% milk upon request. RR even, unlabored. Pt watching Television without any distress noted.

## 2020-07-20 NOTE — ED Provider Notes (Signed)
Emergency Medicine Observation Re-evaluation Note  Devine Klingel is a 60 y.o. male, seen on rounds today.  Pt initially presented to the ED for complaints of Weakness Currently, the patient is resting comfortably.  Physical Exam  BP 101/66 (BP Location: Right Arm)   Pulse 87   Temp 97.9 F (36.6 C)   Resp 18   Ht 5\' 8"  (1.727 m)   Wt (!) 186 kg   SpO2 96%   BMI 62.34 kg/m  Physical Exam Gen: No acute distress  Resp: Normal rise and fall of chest Neuro: Moving all four extremities Psych: Resting currently, calm and cooperative when awake    ED Course / MDM  EKG:EKG Interpretation  Date/Time:  Wednesday Jul 02 2020 13:17:08 EDT Ventricular Rate:  115 PR Interval:    QRS Duration: 134 QT Interval:  286 QTC Calculation: 395 R Axis:   66 Text Interpretation: Atrial fibrillation with rapid ventricular response Non-specific intra-ventricular conduction block T wave abnormality, consider inferior ischemia Abnormal ECG Confirmed by UNCONFIRMED, DOCTOR (08-31-1971), editor [id number], [name] [phone]) on 07/02/2020 2:28:15 PM   I have reviewed the labs performed to date as well as medications administered while in observation.  Recent changes in the last 24 hours include no acute events overnight.  Plan  Current plan is for social work disposition.    Chandrika Sandles, 07/04/2020, DO 07/20/20 [phone]

## 2020-07-21 DIAGNOSIS — F1029 Alcohol dependence with unspecified alcohol-induced disorder: Secondary | ICD-10-CM | POA: Diagnosis not present

## 2020-07-21 DIAGNOSIS — I4891 Unspecified atrial fibrillation: Secondary | ICD-10-CM | POA: Diagnosis not present

## 2020-07-21 NOTE — TOC Progression Note (Addendum)
Transition of Care [facility]) - Progression Note    Patient Details  Name: [name] [name] MRN: [id number] Date of Birth: 1960/11/17  Transition of Care [facility]) CM/SW Contact  [name] Phone Number: [phone] 07/21/2020, 1:34 PM  Clinical Narrative:     CSW sent email to [facility] with patient placement information.    Barriers to Discharge: No Barriers Identified  Expected Discharge Plan and Services                                                 Social Determinants of Health (SDOH) Interventions    Readmission Risk Interventions Readmission Risk Prevention Plan 01/11/2020 12/24/2019 01/25/2019  Transportation Screening Complete Complete Complete  PCP or Specialist Appt within 3-5 Days - - -  HRI or Home Care Consult - - -  Social Work Consult for Recovery Care Planning/Counseling - - -  Palliative Care Screening - - -  Medication Review Oceanographer) Complete Complete Complete  PCP or Specialist appointment within 3-5 days of discharge Complete Complete Complete  HRI or Home Care Consult - - Complete  SW Recovery Care/Counseling Consult Complete Complete Complete  Palliative Care Screening Complete Not Applicable Not Applicable  Skilled Nursing Facility Not Applicable Not Applicable Not Applicable

## 2020-07-21 NOTE — ED Notes (Signed)
Pt complaining that not receiving enough pain mediation. States that should be getting "2 pills every 4 hours" instead of "1 pill every 8 hours". Pt stating that he was overdue for pain meds this morning and got them at 1230pm. Reminded pt that he refused meds this morning because he wanted to sleep.

## 2020-07-21 NOTE — Evaluation (Signed)
Occupational Therapy Evaluation Patient Details Name: [name] [name] MRN: [id number] DOB: November 11, 1960 Today's Date: 07/21/2020    History of Present Illness Pt is a 60 yo M with recent prior admission and plan to d/c to rehab facility but last minute he refused and went home.  Pt now arrives with blood alcohol >300, falls, reports he has manged at home well but was apparent found in his own feces; admitted with respiratory failure.   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 Pt admitted with acute on chronic RF, ETOH withdrawl, AF c RVR. Rt shoulder MRI done this admission 5/7 revealing "large full-thickness rotator cuff tear with  full-thickness retracted insertional tears of the supraspinatus and  infraspinatus tendons. Both tendons are retracted nearly to the level of the glenoid. The subscapularis and teres minor tendons appear intact".   Clinical Impression   Pt seen for OT re-evaluation on this date. Upon arrival to room, pt awake and sitting upright in chair. Pt agreeable to OT eval/tx following moderate encouragement. This date, pt required SUPERVISION for x3 sit<>stand transfers, MIN GUARD for functional mobility of short household distances (~64f) with RW, MIN A for steadying to apply deodorant while standing, and set-up assist to wash/dry face while seated in recliner. Pt continues to demonstrate decreased AROM of RUE, however is making good progress toward goals, and has met 1/3 goals. Pt is progressing towards the remaining 2 goals and anticipate pt will achieve with continued motivation and additional opportunities. Pt continues to benefit from skilled OT services to maximize return to PLOF and minimize risk of future falls, injury, caregiver burden, and readmission. Will continue to follow POC. Discharge  recommendation remains appropriate.     Follow Up Recommendations  SNF    Equipment Recommendations  Other (comment) (defer to next venue of care)       Precautions / Restrictions Precautions Precautions: Fall Restrictions Weight Bearing Restrictions: No      Mobility Bed Mobility               General bed mobility comments: not assessed, pt in recliner and beginning/end of session    Transfers Overall transfer level: Needs assistance Equipment used: Rolling walker (2 wheeled) Transfers: Sit to/from Stand Sit to Stand: Supervision         General transfer comment: safe hand placement    Balance Overall balance assessment: Needs assistance Sitting-balance support: Feet supported Sitting balance-Leahy Scale: Good     Standing balance support: No upper extremity supported;During functional activity Standing balance-Leahy Scale: Poor Standing balance comment: intermittent MIN A for steadying during standing UB ADLs                           ADL either performed or assessed with clinical judgement   ADL Overall ADL's : Needs assistance/impaired     Grooming: Applying deodorant;Minimal assistance;Standing;Wash/dry face;Set up;Sitting Grooming Details (indicate cue type and reason): Intermittent MIN A for steadying while standing to apply deodorant                             Functional mobility during ADLs: Min guard;Rolling walker       Vision Patient Visual Report: No change from baseline  Pertinent Vitals/Pain Pain Assessment: Faces Faces Pain Scale: Hurts even more Pain Location: Chronic back pain Pain Descriptors / Indicators: Sore;Aching Pain Intervention(s): Limited activity within patient's tolerance;Monitored during session;Repositioned        Extremity/Trunk Assessment Upper Extremity Assessment Upper Extremity Assessment: Generalized weakness;RUE deficits/detail RUE Deficits / Details: Decreased  AROM shoulder flexion to <90; painful with movement 2/2 rotator cuff tear.   Lower Extremity Assessment Lower Extremity Assessment: Generalized weakness          Cognition Arousal/Alertness: Awake/alert Behavior During Therapy: WFL for tasks assessed/performed Overall Cognitive Status: Within Functional Limits for tasks assessed                                 General Comments: Pt verbose, talkative t/o session. Agreeable following MOD encouragement             OT Problem List: Decreased strength;Impaired balance (sitting and/or standing);Decreased safety awareness;Cardiopulmonary status limiting activity;Decreased activity tolerance;Decreased coordination;Decreased knowledge of use of DME or AE;Obesity;Pain      OT Treatment/Interventions: Self-care/ADL training;DME and/or AE instruction;Therapeutic activities;Balance training;Therapeutic exercise;Manual therapy;Energy conservation;Patient/family education;Neuromuscular education    OT Goals(Current goals can be found in the care plan section) Acute Rehab OT Goals Patient Stated Goal: to be able "to wipe my butt" again OT Goal Formulation: With patient Time For Goal Achievement: 08/04/20 Potential to Achieve Goals: Fair ADL Goals Pt Will Perform Grooming: with supervision;standing  OT Frequency: Min 1X/week   Barriers to D/C: Inaccessible home environment;Decreased caregiver support       AM-PAC OT "6 Clicks" Daily Activity     Outcome Measure Help from another person eating meals?: A Little Help from another person taking care of personal grooming?: A Little Help from another person toileting, which includes using toliet, bedpan, or urinal?: A Lot Help from another person bathing (including washing, rinsing, drying)?: A Lot Help from another person to put on and taking off regular upper body clothing?: A Little Help from another person to put on and taking off regular lower body clothing?: A Lot 6 Click  Score: 15   End of Session Equipment Utilized During Treatment: Rolling walker;Gait belt;Oxygen Nurse Communication: Mobility status  Activity Tolerance: Patient limited by pain Patient left: in chair;with call bell/phone within reach  OT Visit Diagnosis: Unsteadiness on feet (R26.81);Repeated falls (R29.6);Muscle weakness (generalized) (M62.81)                Time: 7741-2878 OT Time Calculation (min): 25 min Charges:  OT General Charges $OT Visit: 1 Visit OT Treatments $Therapeutic Activity: 8-22 mins  Fredirick Maudlin, OTR/L [place]

## 2020-07-21 NOTE — Consult Note (Signed)
WOC Nurse wound follow up No open wounds Removed Unna's boots; cleaned bilateral LEs Reapplied bilateral Unna's boots Patient tolerated well.   WOC will follow along for Unna's boot changes M/Thursday Ayaana Biondo [facility], CNS, CWON-AP [phone]

## 2020-07-21 NOTE — ED Notes (Signed)
Wound care nurse at bedside changing lower leg wrappings.

## 2020-07-21 NOTE — ED Notes (Signed)
Pts bed linen changed at this time, pt transferred from recliner to bed independently at this time.

## 2020-07-21 NOTE — ED Provider Notes (Signed)
Emergency Medicine Observation Re-evaluation Note  [name] [name] is a 60 y.o. male, seen on rounds today.  Pt initially presented to the ED for complaints of Weakness Currently, the patient is resting comfortably watching TV.  Physical Exam  BP (!) 105/52 (BP Location: Right Wrist)   Pulse 62   Temp 97.9 F (36.6 C) (Axillary)   Resp 20   Ht 5\' 8"  (1.727 m)   Wt (!) 186 kg   SpO2 100%   BMI 62.34 kg/m  Physical Exam General: no acute distress Cardiac: normal rate Lungs: equal chest rise Psych: calm  ED Course / MDM  EKG:EKG Interpretation  Date/Time:  Wednesday Jul 02 2020 13:17:08 EDT Ventricular Rate:  115 PR Interval:    QRS Duration: 134 QT Interval:  286 QTC Calculation: 395 R Axis:   66 Text Interpretation: Atrial fibrillation with rapid ventricular response Non-specific intra-ventricular conduction block T wave abnormality, consider inferior ischemia Abnormal ECG Confirmed by UNCONFIRMED, DOCTOR (08-31-1971), editor [id number], [name] [phone]) on 07/02/2020 2:28:15 PM   I have reviewed the labs performed to date as well as medications administered while in observation.  Recent changes in the last 24 hours include none.  Plan  Current plan is for social work dispo. Patient is not under full IVC at this time.   07/04/2020, MD 07/21/20 1357

## 2020-07-21 NOTE — ED Notes (Signed)
Brought in morning meds including Robaxin and Percocet. Pt resting in bed with cpap machine on and lights off. Pt asked to wait for morning meds. Will administer once pt ready.

## 2020-07-22 NOTE — ED Notes (Signed)
Patient refused PT at this time.

## 2020-07-22 NOTE — ED Notes (Signed)
This RN to bedside due to aptient calling out requesting help to get back in bed, upon arrival pt already in bed, requested his bedside table be moved closed.

## 2020-07-22 NOTE — ED Notes (Signed)
Patient assisted with clearing bedside table per request.

## 2020-07-22 NOTE — Progress Notes (Signed)
PT Cancellation Note  Patient Details Name: [name] [name] MRN: [id number] DOB: 10/26/1960   Cancelled Treatment:    Reason Eval/Treat Not Completed: Patient declined to participate with PT services this date secondary to general fatigue and reported "I just got comfortable."  Will attempt to see pt at a future date/time as medically appropriate.     Ovidio Hanger PT, DPT 07/22/20, 4:13 PM

## 2020-07-22 NOTE — ED Notes (Signed)
Medications administered as ordered, pt tolerated well. Pt sitting up in recliner in room eating breakfast at this time, call bell within reach. Pt denies further needs at this time.

## 2020-07-22 NOTE — ED Notes (Signed)
OT at bedside, patient eating and asked to come back later.

## 2020-07-22 NOTE — ED Notes (Signed)
Pt visualized resting in bed with lights dimmed, pt on CPAP at this time.

## 2020-07-22 NOTE — ED Notes (Signed)
Pt continues to rest in bed with lights dimmed, pt noted to intermittently reposition self in bed for comfort. Call bell remains within reach, hospital bed at lowest setting for safety.

## 2020-07-22 NOTE — ED Notes (Signed)
This RN to bedside due to call bell going off, on arrival to bedside, pt noted to continue to be resting in bed with eyes closed and CPap.

## 2020-07-22 NOTE — TOC Progression Note (Signed)
Transition of Care [facility]) - Progression Note    Patient Details  Name: [name] [name] MRN: [id number] Date of Birth: 06/10/1960  Transition of Care [facility]) CM/SW Contact  [name] Phone Number: [phone] 07/22/2020, 4:14 PM  Clinical Narrative:     CSW spoke with Revonda Standard at [facility] who stated she would take a look at patient's [facility] and referral and let me know about placement. Revonda Standard also stated she thinks patient would qualify for long term care placement and would assist with application if the patient is approved for placement.     Barriers to Discharge: No Barriers Identified  Expected Discharge Plan and Services                                                 Social Determinants of Health (SDOH) Interventions    Readmission Risk Interventions Readmission Risk Prevention Plan 01/11/2020 12/24/2019 01/25/2019  Transportation Screening Complete Complete Complete  PCP or Specialist Appt within 3-5 Days - - -  HRI or Home Care Consult - - -  Social Work Consult for Recovery Care Planning/Counseling - - -  Palliative Care Screening - - -  Medication Review Oceanographer) Complete Complete Complete  PCP or Specialist appointment within 3-5 days of discharge Complete Complete Complete  HRI or Home Care Consult - - Complete  SW Recovery Care/Counseling Consult Complete Complete Complete  Palliative Care Screening Complete Not Applicable Not Applicable  Skilled Nursing Facility Not Applicable Not Applicable Not Applicable

## 2020-07-22 NOTE — ED Notes (Signed)
Pt given ginger ale per request. Pt has no further needs at this time. Call light within reach.

## 2020-07-22 NOTE — ED Notes (Signed)
Patient put on cpap independently, states he is going to sleep, lights dimmed and TV turned off.

## 2020-07-22 NOTE — ED Notes (Signed)
Patient assisted by patient representative at this time. Remains laying in stretcher.

## 2020-07-23 MED ORDER — DICLOFENAC SODIUM 1 % EX GEL
2.0000 g | Freq: Four times a day (QID) | CUTANEOUS | Status: DC | PRN
  Administered 2020-07-23: 2 g via TOPICAL
  Filled 2020-07-23 (×2): qty 100

## 2020-07-23 NOTE — ED Notes (Signed)
Pt resting in recliner at bedside after walking with PT. Requests "muscle cream" for his shoulder, will ask ED doctor for new orders.

## 2020-07-23 NOTE — ED Notes (Signed)
Report received from Jennifer, RN

## 2020-07-23 NOTE — Progress Notes (Signed)
Physical Therapy Treatment Patient Details Name: [name] [name] MRN: [id number] DOB: 06-13-1960 Today's Date: 07/23/2020    History of Present Illness Pt is a 60 yo M with recent prior admission and plan to d/c to rehab facility but last minute he refused and went home.  Pt now arrives with blood alcohol >300, falls, reports he has manged at home well but was apparent found in his own feces; admitted with respiratory failure.   PMH: morbid obesity, CRF, obesity hypoventilation syndrome, HTN, depression, GAD, ETOH abuse, left deafness since age of 78, COPD, nonischemic cardiomyopathy, HF c reduced EF, PAF, OSA, DVT, PE, GERD, hypoTSH, HLD, LEE. Pt admitted with acute on chronic RF, ETOH withdrawl, AF c RVR. Rt shoulder MRI done this admission 5/7 revealing "large full-thickness rotator cuff tear with  full-thickness retracted insertional tears of the supraspinatus and  infraspinatus tendons. Both tendons are retracted nearly to the level of the glenoid. The subscapularis and teres minor tendons appear intact".    PT Comments    Pt was pleasant and motivated to participate during the session and put forth good effort throughout.  Pt was steady with transfers with min verbal cues for hand placement and was able to amb 80 feet x 1 and then 50 feet x 1 with 2-3 min therapeutic rest break between walks.  Pt was steady with gait with slow, step-to pattern but reported that he had pain in his LLE and was concerned about his LLE buckling.  Pt eduction provided on step-to sequencing to protect against L knee buckling with pt able to demonstrate sequencing with good stability and control throughout.  Pt's SpO2 remained 99-100% on 4LO2/min throughout the session, nsg notified. Pt will benefit from PT services in a SNF setting upon discharge to safely address deficits listed in patient problem list for decreased caregiver assistance and eventual return to PLOF.     Follow Up Recommendations  SNF      Equipment Recommendations  None recommended by PT    Recommendations for Other Services       Precautions / Restrictions Precautions Precautions: Fall Restrictions Weight Bearing Restrictions: No    Mobility  Bed Mobility               General bed mobility comments: not assessed, pt in recliner and beginning/end of session    Transfers Overall transfer level: Needs assistance Equipment used: Rolling walker (2 wheeled) Transfers: Sit to/from Stand Sit to Stand: Supervision         General transfer comment: Min verbal cues for sequencing for hand placement  Ambulation/Gait Ambulation/Gait assistance: Min guard Gait Distance (Feet): 80 Feet x 1, 50 Feet x 1 Assistive device: Rolling walker (2 wheeled) Gait Pattern/deviations: Step-through pattern;Decreased step length - right;Decreased step length - left;Trunk flexed;Step-to pattern Gait velocity: decreased   General Gait Details: Slow but consistent cadence with short B step length, steady with no LOB or buckling; pt c/o LLE pain and feeling as if L knee could buckle with step-to sequencing education provided to protect against L knee buckling   Stairs             Wheelchair Mobility    Modified Rankin (Stroke Patients Only)       Balance Overall balance assessment: Needs assistance Sitting-balance support: Feet supported Sitting balance-Leahy Scale: Good     Standing balance support: No upper extremity supported;During functional activity Standing balance-Leahy Scale: Fair Standing balance comment: Min to mod lean on the RW for support  Cognition Arousal/Alertness: Awake/alert Behavior During Therapy: WFL for tasks assessed/performed Overall Cognitive Status: Within Functional Limits for tasks assessed                                        Exercises Total Joint Exercises Ankle Circles/Pumps: Strengthening;10 reps;15 reps Long Arc  Quad: Strengthening;Both;10 reps;15 reps Knee Flexion: Strengthening;10 reps;Both;15 reps Marching in Standing: AROM;Strengthening;Left;5 reps;Seated    General Comments        Pertinent Vitals/Pain Pain Assessment: No/denies pain Pain Score: 8  Pain Location: Chronic back pain Pain Descriptors / Indicators: Sore;Aching Pain Intervention(s): Monitored during session;Premedicated before session;Repositioned    Home Living                      Prior Function            PT Goals (current goals can now be found in the care plan section) Progress towards PT goals: Progressing toward goals    Frequency    Min 2X/week      PT Plan Current plan remains appropriate    Co-evaluation              AM-PAC PT "6 Clicks" Mobility   Outcome Measure  Help needed turning from your back to your side while in a flat bed without using bedrails?: A Little Help needed moving from lying on your back to sitting on the side of a flat bed without using bedrails?: A Little Help needed moving to and from a bed to a chair (including a wheelchair)?: A Little Help needed standing up from a chair using your arms (e.g., wheelchair or bedside chair)?: A Little Help needed to walk in hospital room?: A Little Help needed climbing 3-5 steps with a railing? : A Lot 6 Click Score: 17    End of Session Equipment Utilized During Treatment: Oxygen;Gait belt Activity Tolerance: Patient tolerated treatment well Patient left: with call bell/phone within reach;in chair;with nursing/sitter in room Nurse Communication: Mobility status;Other (comment) (SpO2 99-100% throughout the session on 4LO2/min) PT Visit Diagnosis: Muscle weakness (generalized) (M62.81);Difficulty in walking, not elsewhere classified (R26.2)     Time: 0354-6568 PT Time Calculation (min) (ACUTE ONLY): 23 min  Charges:  $Gait Training: 8-22 mins $Therapeutic Exercise: 8-22 mins                     D. Scott Ayanah Snader PT,  DPT 07/23/20, 5:00 PM

## 2020-07-23 NOTE — ED Notes (Signed)
Pt given three 2% milk.

## 2020-07-24 NOTE — TOC Progression Note (Signed)
Transition of Care [facility]) - Progression Note    Patient Details  Name: [name] [name] MRN: [id number] Date of Birth: Jul 20, 1960  Transition of Care [facility]) CM/SW Contact  [name] Phone Number: [phone] 07/24/2020, 9:25 AM  Clinical Narrative:     CSW updated patient on bed search status and the recommendation for PCS services.  CSW stated I had not received a reply from [facility] about the [facility] services.  CSW stated it was important the patient participated in PT.  Patient stated he was participating in PT.  CSW stated there were two times in the last week he did not participate and it was essential for him to participate in PT.  CSW stated the SNF facilities would look to see if he was compliant with PT.  Patient stated he would participate in PT.    Barriers to Discharge: No Barriers Identified  Expected Discharge Plan and Services                                                 Social Determinants of Health (SDOH) Interventions    Readmission Risk Interventions Readmission Risk Prevention Plan 01/11/2020 12/24/2019 01/25/2019  Transportation Screening Complete Complete Complete  PCP or Specialist Appt within 3-5 Days - - -  HRI or Home Care Consult - - -  Social Work Consult for Recovery Care Planning/Counseling - - -  Palliative Care Screening - - -  Medication Review Oceanographer) Complete Complete Complete  PCP or Specialist appointment within 3-5 days of discharge Complete Complete Complete  HRI or Home Care Consult - - Complete  SW Recovery Care/Counseling Consult Complete Complete Complete  Palliative Care Screening Complete Not Applicable Not Applicable  Skilled Nursing Facility Not Applicable Not Applicable Not Applicable

## 2020-07-24 NOTE — TOC Progression Note (Signed)
Transition of Care [facility]) - Progression Note    Patient Details  Name: Exzavier Ruderman MRN: [id number] Date of Birth: 1960-06-20  Transition of Care [facility]) CM/SW Contact  Trumann Cellar, RN Phone Number: 07/24/2020, 8:27 AM  Clinical Narrative:     Secure email sent to Ms. [name] @ [facility] [phone] requesting update on placement and consideration of arranging discharge home with PCS services due to lack of bed offers for placement.     Barriers to Discharge: No Barriers Identified  Expected Discharge Plan and Services                                                 Social Determinants of Health (SDOH) Interventions    Readmission Risk Interventions Readmission Risk Prevention Plan 01/11/2020 12/24/2019 01/25/2019  Transportation Screening Complete Complete Complete  PCP or Specialist Appt within 3-5 Days - - -  HRI or Home Care Consult - - -  Social Work Consult for Recovery Care Planning/Counseling - - -  Palliative Care Screening - - -  Medication Review Oceanographer) Complete Complete Complete  PCP or Specialist appointment within 3-5 days of discharge Complete Complete Complete  HRI or Home Care Consult - - Complete  SW Recovery Care/Counseling Consult Complete Complete Complete  Palliative Care Screening Complete Not Applicable Not Applicable  Skilled Nursing Facility Not Applicable Not Applicable Not Applicable

## 2020-07-24 NOTE — Consult Note (Signed)
WOC Nurse wound follow up Wound type: none Removed old Unna's boots, cleansed legs and feet Reapply Unna's boots bilaterally.  Patient tolerated without difficulty.  WOC nurse to change M/Thursday while inpatient.   Garlan Drewes [facility], CNS, [employer] [phone]

## 2020-07-24 NOTE — ED Notes (Signed)
This RN attempted to obtain pt morning lab at this time. Pt refused.

## 2020-07-24 NOTE — ED Provider Notes (Signed)
-----------------------------------------   5:22 AM on 07/24/2020 -----------------------------------------   Blood pressure (!) 157/99, pulse 72, temperature 98.2 F (36.8 C), resp. rate 18, height 5\' 8"  (1.727 m), weight (!) 186 kg, SpO2 98 %.  The patient is calm and cooperative at this time.  There have been no acute events since the last update.  Awaiting disposition plan from Social Work team.   , MD 07/24/20 [phone]

## 2020-07-24 NOTE — ED Notes (Signed)
Called lab to have them come to draw CBC that was ordered for 0500 today.

## 2020-07-24 NOTE — ED Notes (Signed)
Pt given milk at this time.

## 2020-07-25 NOTE — ED Notes (Signed)
Pt provided with breakfast tray, no needs voiced at this time.  Will monitor.

## 2020-07-25 NOTE — TOC Progression Note (Signed)
Transition of Care [facility]) - Progression Note    Patient Details  Name: [name] [name] MRN: [id number] Date of Birth: 02-08-1961  Transition of Care [facility]) CM/SW Contact  [name] Phone Number: [phone] 07/25/2020, 12:16 PM  Clinical Narrative:     CSW called and left voicemail for [name] [facility] social worker, [phone], [phone], requesting update on patient placement and requesting reply on Personal Care Services email inquiry.    Barriers to Discharge: No Barriers Identified  Expected Discharge Plan and Services                                                 Social Determinants of Health (SDOH) Interventions    Readmission Risk Interventions Readmission Risk Prevention Plan 01/11/2020 12/24/2019 01/25/2019  Transportation Screening Complete Complete Complete  PCP or Specialist Appt within 3-5 Days - - -  HRI or Home Care Consult - - -  Social Work Consult for Recovery Care Planning/Counseling - - -  Palliative Care Screening - - -  Medication Review Oceanographer) Complete Complete Complete  PCP or Specialist appointment within 3-5 days of discharge Complete Complete Complete  HRI or Home Care Consult - - Complete  SW Recovery Care/Counseling Consult Complete Complete Complete  Palliative Care Screening Complete Not Applicable Not Applicable  Skilled Nursing Facility Not Applicable Not Applicable Not Applicable

## 2020-07-25 NOTE — Progress Notes (Signed)
Physical Therapy Treatment Patient Details Name: [name] [name] MRN: [id number] DOB: 1960/04/01 Today's Date: 07/25/2020    History of Present Illness Pt is a 60 yo M with recent prior admission and plan to d/c to rehab facility but last minute he refused and went home.  Pt now arrives with blood alcohol >300, falls, reports he has manged at home well but was apparent found in his own feces; admitted with respiratory failure.   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 Pt admitted with acute on chronic RF, ETOH withdrawl, AF c RVR. Rt shoulder MRI done this admission 5/7 revealing "large full-thickness rotator cuff tear with  full-thickness retracted insertional tears of the supraspinatus and  infraspinatus tendons. Both tendons are retracted nearly to the level of the glenoid. The subscapularis and teres minor tendons appear intact".    PT Comments    Agrees to session.  Able to get to EOB with rail and supervision.  Stands and walks 100'  in 2 small loops to nursing station and back but does not opt for seated rest today.  No LOB or buckling noted and remains in recliner after session.  Overall does well today.   Follow Up Recommendations  SNF     Equipment Recommendations  None recommended by PT    Recommendations for Other Services       Precautions / Restrictions Precautions Precautions: Fall Restrictions Weight Bearing Restrictions: No    Mobility  Bed Mobility Overal bed mobility: Modified Independent                  Transfers Overall transfer level: Needs assistance Equipment used: Rolling walker (2 wheeled) Transfers: Sit to/from Stand Sit to Stand: Supervision            Ambulation/Gait Ambulation/Gait assistance: Min guard Gait Distance (Feet): 100 Feet Assistive  device: Rolling walker (2 wheeled) Gait Pattern/deviations: Step-through pattern;Decreased step length - right;Decreased step length - left;Trunk flexed;Step-to pattern Gait velocity: decreased   General Gait Details: irregular pattern but slow and steady with no feeling of L knee weakness/bucking today   Stairs             Wheelchair Mobility    Modified Rankin (Stroke Patients Only)       Balance Overall balance assessment: Needs assistance Sitting-balance support: Feet supported Sitting balance-Leahy Scale: Good     Standing balance support: No upper extremity supported;During functional activity Standing balance-Leahy Scale: Fair Standing balance comment: Min to mod lean on the RW for support                            Cognition Arousal/Alertness: Awake/alert Behavior During Therapy: WFL for tasks assessed/performed Overall Cognitive Status: Within Functional Limits for tasks assessed                                        Exercises      General Comments        Pertinent Vitals/Pain Pain Assessment: Faces Faces Pain Scale: Hurts little more Pain Location: L h ip, R heel Pain Descriptors / Indicators: Sore;Aching Pain Intervention(s): Limited activity within patient's tolerance;Monitored during session;Repositioned    Home Living  Prior Function            PT Goals (current goals can now be found in the care plan section) Progress towards PT goals: Progressing toward goals    Frequency    Min 2X/week      PT Plan Current plan remains appropriate    Co-evaluation              AM-PAC PT "6 Clicks" Mobility   Outcome Measure  Help needed turning from your back to your side while in a flat bed without using bedrails?: A Little Help needed moving from lying on your back to sitting on the side of a flat bed without using bedrails?: A Little Help needed moving to and from a bed to  a chair (including a wheelchair)?: A Little Help needed standing up from a chair using your arms (e.g., wheelchair or bedside chair)?: A Little Help needed to walk in hospital room?: A Little Help needed climbing 3-5 steps with a railing? : A Lot 6 Click Score: 17    End of Session Equipment Utilized During Treatment: Oxygen;Gait belt Activity Tolerance: Patient tolerated treatment well Patient left: with call bell/phone within reach;in chair;with nursing/sitter in room Nurse Communication: Mobility status;Other (comment) (SpO2 99-100% throughout the session on 4LO2/min) PT Visit Diagnosis: Muscle weakness (generalized) (M62.81);Difficulty in walking, not elsewhere classified (R26.2)     Time: 1660-6004 PT Time Calculation (min) (ACUTE ONLY): 14 min  Charges:  $Gait Training: 8-22 mins                    Danielle Dess, PTA 07/25/20, 11:46 AM

## 2020-07-25 NOTE — ED Notes (Signed)
To room for AM meds and VS check.  Pt sleeping soundly with CPap in place.  Will defer meds and VS until pt is awake.

## 2020-07-26 NOTE — ED Notes (Signed)
Patient is resting comfortably.No acute distress noted.

## 2020-07-26 NOTE — ED Notes (Signed)
Patient disgruntled with this RN because he was not woken up for breakfast or dinner.  Educated patient that I was not here for breakfast and we didn't wake him up for lunch because he's sleeping.  Offered to warm up the lunch that was left for him, but he chose to throw it away.

## 2020-07-26 NOTE — ED Notes (Signed)
Lunch tray given to patient

## 2020-07-26 NOTE — ED Notes (Signed)
VOL/ Pending placement when available

## 2020-07-26 NOTE — ED Notes (Signed)
Given 2 chocolate milk cartons.

## 2020-07-27 DIAGNOSIS — F1029 Alcohol dependence with unspecified alcohol-induced disorder: Secondary | ICD-10-CM | POA: Diagnosis not present

## 2020-07-27 DIAGNOSIS — I4891 Unspecified atrial fibrillation: Secondary | ICD-10-CM | POA: Diagnosis not present

## 2020-07-27 NOTE — ED Provider Notes (Signed)
Emergency Medicine Observation Re-evaluation Note  [name] [name] is a 60 y.o. male, seen on rounds today.  Pt initially presented to the ED for complaints of Weakness Currently, the patient is resting.  Physical Exam  BP (!) 98/45 (BP Location: Right Arm)   Pulse (!) 59   Temp 98 F (36.7 C) (Oral)   Resp 16   Ht 5\' 8"  (1.727 m)   Wt (!) 173.7 kg   SpO2 99%   BMI 58.23 kg/m  Physical Exam Constitutional:      Appearance: He is not ill-appearing or toxic-appearing.  HENT:     Head: Atraumatic.  Cardiovascular:     Comments: Well perfused Pulmonary:     Effort: Pulmonary effort is normal.  Abdominal:     General: There is no distension.  Musculoskeletal:        General: No deformity.  Skin:    Findings: No rash.  Neurological:     General: No focal deficit present.     Cranial Nerves: No cranial nerve deficit.    ED Course / MDM  EKG:EKG Interpretation  Date/Time:  Wednesday Jul 02 2020 13:17:08 EDT Ventricular Rate:  115 PR Interval:    QRS Duration: 134 QT Interval:  286 QTC Calculation: 395 R Axis:   66 Text Interpretation: Atrial fibrillation with rapid ventricular response Non-specific intra-ventricular conduction block T wave abnormality, consider inferior ischemia Abnormal ECG Confirmed by UNCONFIRMED, DOCTOR (08-31-1971), editor [id number], [name] [phone]) on 07/02/2020 2:28:15 PM  I have reviewed the labs performed to date as well as medications administered while in observation.  Recent changes in the last 24 hours include I discussed the case with hospitalist overnight tonight for possible admission due to his quite prolonged stay in our emergency department.  Hospitalist reports discomfort admitting the patient as there are no acute medical comorbidities present.  Plan  Current plan is for social disposition. Patient is not under full IVC at this time.   07/04/2020, MD 07/27/20 [phone]

## 2020-07-27 NOTE — ED Notes (Signed)
Pharmacy messaged for medication not currently in ED pyxis. Patient requesting Robaxin. Resting comfortably with no other needs identified at this time.

## 2020-07-27 NOTE — ED Notes (Signed)
Pt offered water to take his meds with. Pt refused water and demanded two cartons of milk. Rn provided one carton of milk due to shortage in fridge.

## 2020-07-27 NOTE — ED Notes (Signed)
Lunch tray delivered.

## 2020-07-28 LAB — SARS CORONAVIRUS 2 (TAT 6-24 HRS): SARS Coronavirus 2: NEGATIVE
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
## 2020-07-28 NOTE — Progress Notes (Signed)
PT Cancellation Note  Patient Details Name: [name] [name] MRN: [id number] DOB: 1960-08-18   Cancelled Treatment:     Offered and encouraged session,  Pt awakens to voice but stated he has a headache.  Declined session at this time.  Will continue as appropriate.   Danielle Dess 07/28/2020, 2:23 PM

## 2020-07-28 NOTE — ED Notes (Signed)
Urinal x2 emptied

## 2020-07-28 NOTE — Consult Note (Signed)
WOC Nurse follow-up consult Note: Pt wears bilat Una boots which are chaged twice a week. Removed Una boots; no open wound or drainage to bilat legs.  Remains with generalized edema, legs remain with darker-colored skin with chronic venous stasis changes. Dressing procedure/placement/frequency: Applied bilat Una boots and coban. WOC team will plan to change dressings to legs Thurs if he is still in the hospital at that time. Cammie Mcgee MSN, RN, CWOCN, [place], CNS [phone]

## 2020-07-28 NOTE — ED Notes (Signed)
PT at bedside.

## 2020-07-28 NOTE — ED Notes (Signed)
Choc milk x3 given, no other needs athits time

## 2020-07-28 NOTE — Progress Notes (Signed)
Occupational Therapy Treatment Patient Details Name: [name] [name] MRN: [id number] DOB: Nov 08, 1960 Today's Date: 07/28/2020    History of present illness Pt is a 60 yo M with recent prior admission and plan to d/c to rehab facility but last minute he refused and went home.  Pt now arrives with blood alcohol >300, falls, reports he has manged at home well but was apparent found in his own feces; admitted with respiratory failure.   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 Pt admitted with acute on chronic RF, ETOH withdrawl, AF c RVR. Rt shoulder MRI done this admission 5/7 revealing "large full-thickness rotator cuff tear with  full-thickness retracted insertional tears of the supraspinatus and  infraspinatus tendons. Both tendons are retracted nearly to the level of the glenoid. The subscapularis and teres minor tendons appear intact".   OT comments  [name] [name] was pleasant and agreeable to today's session focused on self-care engagement and chronic pain management, though remains limited by pain, generalized weakness, and decreased motivation.  Pt was received seated EOB eating breakfast.  Pt declined OOB activity due to wanting to finish breakfast and fatigue, but was agreeable to seated self-care tasks.  OT provided setup assist for seated grooming tasks (washing face, applying deodorant).  OT provided mod assist for upper body dressing due to R rotator cuff injury (pain, limited AROM).  OT provided education re: adaptive equipment for perihygiene, as pt expressed frustration with being unable to wipe himself due to R shoulder injury.  OT also provided education re: chronic pain management strategies to improve engagement in self-care occupations, including relaxation strategies, distraction, and visualization.  Pt  verbalized understanding of education provided.  [name] [name] will likely continue to benefit from skilled OT services in acute setting to address chronic pain management, functional strengthening, endurance, and safety and independence in ADLs.  SNF remains most appropriate discharge recommendation.     Follow Up Recommendations  SNF    Equipment Recommendations  Other (comment) (defer to next level of care)    Recommendations for Other Services      Precautions / Restrictions Precautions Precautions: Fall Restrictions Weight Bearing Restrictions: No       Mobility Bed Mobility               General bed mobility comments: not assessed, received/left seated EOB Patient Response: Cooperative  Transfers Overall transfer level: Needs assistance Equipment used: Rolling walker (2 wheeled)                  Balance Overall balance assessment: Needs assistance                                         ADL either performed or assessed with clinical judgement   ADL Overall ADL's : Needs assistance/impaired Eating/Feeding: Set up;Sitting   Grooming: Applying deodorant;Wash/dry face;Set up;Sitting Grooming Details (indicate cue type and reason): Provided setup assist for washing face and grooming tasks while seated EOB Upper Body Bathing: Sitting;Minimal assistance;Moderate assistance Upper Body Bathing Details (indicate cue type and reason): grossly mod assist needed due to R rotator cuff injury     Upper Body Dressing : Sitting;Minimal assistance Upper Body Dressing Details (indicate cue type and reason): provided min assist for upper  body dressing while seated EOB, limited by R shoulder injury                         Vision Patient Visual Report: No change from baseline     Perception     Praxis      Cognition Arousal/Alertness: Awake/alert Behavior During Therapy: WFL for tasks assessed/performed Overall Cognitive Status: Within  Functional Limits for tasks assessed                                 General Comments: Pt pleasant and engaged, though limited motivation to engage in strenuous activity        Exercises Other Exercises Other Exercises: provided setup assist for seated grooming and dressing tasks, education re: benefits of engagement in meaningful occupations/therapeutic activity, as well as relaxation strategies for chronic pain management   Shoulder Instructions       General Comments      Pertinent Vitals/ Pain       Pain Location: reports chronic pain in R shoulder, but declines any current needs Pain Intervention(s): Limited activity within patient's tolerance;Monitored during session;Utilized relaxation techniques  Home Living                                          Prior Functioning/Environment              Frequency  Min 1X/week        Progress Toward Goals  OT Goals(current goals can now be found in the care plan section)  Progress towards OT goals: Progressing toward goals  Acute Rehab OT Goals Patient Stated Goal: to be able "to wipe my butt" again OT Goal Formulation: With patient Time For Goal Achievement: 08/04/20 Potential to Achieve Goals: Fair  Plan Discharge plan remains appropriate;Frequency remains appropriate    Co-evaluation                 AM-PAC OT "6 Clicks" Daily Activity     Outcome Measure   Help from another person eating meals?: None Help from another person taking care of personal grooming?: A Little Help from another person toileting, which includes using toliet, bedpan, or urinal?: A Lot Help from another person bathing (including washing, rinsing, drying)?: A Lot Help from another person to put on and taking off regular upper body clothing?: A Little Help from another person to put on and taking off regular lower body clothing?: A Lot 6 Click Score: 16    End of Session Equipment Utilized During  Treatment: Oxygen  OT Visit Diagnosis: Unsteadiness on feet (R26.81);Repeated falls (R29.6);Muscle weakness (generalized) (M62.81)   Activity Tolerance Patient limited by pain   Patient Left with call bell/phone within reach;in bed   Nurse Communication          Time: 2993-7169 OT Time Calculation (min): 32 min  Charges: OT General Charges $OT Visit: 1 Visit OT Treatments $Self Care/Home Management : 23-37 mins  Dennison Nancy, OTR/L 07/28/20, 9:41 AM

## 2020-07-29 NOTE — ED Notes (Signed)
Linens and pillow case changed in recliner. Pt denies any other needs at this time.

## 2020-07-29 NOTE — ED Notes (Signed)
Patient refusing morning labs being drawn stating that he is not here for labs. Patient states, "All my lab work is fine. I am not being stuck for that. Nobody can ever get blood from me".

## 2020-07-29 NOTE — TOC Progression Note (Addendum)
Transition of Care [facility]) - Progression Note    Patient Details  Name: [name] [name] MRN: [id number] Date of Birth: 1960/10/25  [facility] CM/SW Contact  [name] Phone Number: [phone] 07/29/2020, 2:33 PM  Clinical Narrative:     CSW emailed ALF FL2 to Sharlyne Cai and Theodis Shove.  CSW spoke with Theodis Shove and updated her on the placement process for patient and recommended PCS services and sending the patient back home. Shanda Bumps stated she would need to speak with Sharlyne Cai, her supervisor and discuss plan for patient placement and she would update me on the conversation.     Barriers to Discharge: No Barriers Identified  Expected Discharge Plan and Services                                                 Social Determinants of Health (SDOH) Interventions    Readmission Risk Interventions Readmission Risk Prevention Plan 01/11/2020 12/24/2019 01/25/2019  Transportation Screening Complete Complete Complete  PCP or Specialist Appt within 3-5 Days - - -  HRI or Home Care Consult - - -  Social Work Consult for Recovery Care Planning/Counseling - - -  Palliative Care Screening - - -  Medication Review [facility]) Complete Complete Complete  PCP or Specialist appointment within 3-5 days of discharge Complete Complete Complete  HRI or Home Care Consult - - Complete  SW Recovery Care/Counseling Consult Complete Complete Complete  Palliative Care Screening Complete Not Applicable Not Applicable  Skilled Nursing Facility Not Applicable Not Applicable Not Applicable

## 2020-07-29 NOTE — TOC Progression Note (Signed)
Transition of Care [facility]) - Progression Note    Patient Details  Name: Aleksander Edmiston MRN: [id number] Date of Birth: 12-10-1960  Transition of [facility]) CM/SW Contact  [name] Phone Number: [phone] 07/29/2020, 2:26 PM  Clinical Narrative:     CSW emailed 2 weeks of progress notes and FL2 for SNF placement, sent to Sharlyne Cai and Theodis Shove.    Barriers to Discharge: No Barriers Identified  Expected Discharge Plan and Services                                                 Social Determinants of Health (SDOH) Interventions    Readmission Risk Interventions Readmission Risk Prevention Plan 01/11/2020 12/24/2019 01/25/2019  Transportation Screening Complete Complete Complete  PCP or Specialist Appt within 3-5 Days - - -  HRI or Home Care Consult - - -  Social Work Consult for Recovery Care Planning/Counseling - - -  Palliative Care Screening - - -  Medication Review [facility]) Complete Complete Complete  PCP or Specialist appointment within 3-5 days of discharge Complete Complete Complete  HRI or Home Care Consult - - Complete  SW Recovery Care/Counseling Consult Complete Complete Complete  Palliative Care Screening Complete Not Applicable Not Applicable  Skilled Nursing Facility Not Applicable Not Applicable Not Applicable

## 2020-07-29 NOTE — ED Notes (Signed)
VOL  PENDING  PLACEMENT 

## 2020-07-29 NOTE — ED Notes (Signed)
Patient called out stating that his bipap mask broke. Patient provided with new mask.

## 2020-07-29 NOTE — ED Notes (Signed)
Pt given breakfast tray at this time. 

## 2020-07-30 ENCOUNTER — Emergency Department: Payer: Medicaid Other

## 2020-07-30 DIAGNOSIS — I517 Cardiomegaly: Secondary | ICD-10-CM | POA: Diagnosis not present

## 2020-07-30 NOTE — Progress Notes (Signed)
Physical Therapy Treatment Patient Details Name: [name] [name] MRN: [id number] DOB: 07/10/1960 Today's Date: 07/30/2020    History of Present Illness Pt is a 60 yo M with recent prior admission and plan to d/c to rehab facility but last minute he refused and went home.  Pt now arrives with blood alcohol >300, falls, reports he has manged at home well but was apparent found in his own feces; admitted with respiratory failure.   PMH: morbid obesity, CRF, obesity hypoventilation syndrome, HTN, depression, GAD, ETOH abuse, left deafness since age of 71, COPD, nonischemic cardiomyopathy, HF c reduced EF, PAF, OSA, DVT, PE, GERD, hypoTSH, HLD, LEE. Pt admitted with acute on chronic RF, ETOH withdrawl, AF c RVR. Rt shoulder MRI done this admission 5/7 revealing "large full-thickness rotator cuff tear with  full-thickness retracted insertional tears of the supraspinatus and  infraspinatus tendons. Both tendons are retracted nearly to the level of the glenoid. The subscapularis and teres minor tendons appear intact".    PT Comments    Pt was pleasant and motivated to participate during the session. Pt's HR WNL and SPO2 >/=98% throughout session on 4L supplemental O2 via nasal cannula. Pt required no physical assistance with functional mobility but some verbal cueing for walker placement during ambulation. Pt is making progress towards goals. Pt will benefit from SNF upon discharge to safely address deficits listed in patient problem list for decreased caregiver assistance and eventual return to PLOF.     Follow Up Recommendations  SNF     Equipment Recommendations  None recommended by PT    Recommendations for Other Services       Precautions / Restrictions Precautions Precautions: Fall Restrictions Weight Bearing Restrictions: No    Mobility  Bed Mobility Overal bed mobility: Modified Independent Bed Mobility: Supine to Sit     Supine to sit: Modified independent (Device/Increase  time) Sit to supine: Modified independent (Device/Increase time)        Transfers Overall transfer level: Needs assistance Equipment used: Rolling walker (2 wheeled) Transfers: Sit to/from Stand Sit to Stand: Supervision         General transfer comment: Min verbal cues for sequencing for hand placement  Ambulation/Gait Ambulation/Gait assistance: Min guard Gait Distance (Feet): 80 Feet Assistive device: Rolling walker (2 wheeled) Gait Pattern/deviations: Trunk flexed;Wide base of support Gait velocity: decreased   General Gait Details: Verbal cues to keep walker close to him and upright posture with decreased weight bearing from UEs. With supplemental O2 at 4L.    Stairs             Wheelchair Mobility    Modified Rankin (Stroke Patients Only)       Balance Overall balance assessment: Needs assistance Sitting-balance support: Feet supported Sitting balance-Leahy Scale: Good     Standing balance support: Bilateral upper extremity supported Standing balance-Leahy Scale: Fair                              Cognition Arousal/Alertness: Awake/alert Behavior During Therapy: WFL for tasks assessed/performed Overall Cognitive Status: Within Functional Limits for tasks assessed                                        Exercises General Exercises - Lower Extremity Ankle Circles/Pumps: AROM;10 reps;Supine;Other (comment) (with resistance from therapist's hand) Quad Sets: AROM;Strengthening;Both;10 reps;Supine Gluteal Sets: AROM;Strengthening;Both;10  reps;Supine Long Arc Quad: AROM;Strengthening;Both;10 reps;Seated (with resistance from towel) Hip ABduction/ADduction: AROM;Strengthening;Right;5 reps;Left;10 reps;Supine Straight Leg Raises: AROM;AAROM;Strengthening;Both;10 reps;Supine Other Exercises Other Exercises: Knee flexion on edge of bed bilataerally x10 with resitance from therapist. Other Exercises: Bridges bilaterally x10  supine in the bed.    General Comments        Pertinent Vitals/Pain Pain Assessment: 0-10 Pain Score: 9  Pain Location: reports chronic pain in R shoulder Pain Descriptors / Indicators: Sore;Aching Pain Intervention(s): Monitored during session    Home Living                      Prior Function            PT Goals (current goals can now be found in the care plan section) Progress towards PT goals: Progressing toward goals    Frequency    Min 2X/week      PT Plan Current plan remains appropriate    Co-evaluation              AM-PAC PT "6 Clicks" Mobility   Outcome Measure  Help needed turning from your back to your side while in a flat bed without using bedrails?: A Little Help needed moving from lying on your back to sitting on the side of a flat bed without using bedrails?: A Little Help needed moving to and from a bed to a chair (including a wheelchair)?: A Little Help needed standing up from a chair using your arms (e.g., wheelchair or bedside chair)?: A Little Help needed to walk in hospital room?: A Little Help needed climbing 3-5 steps with a railing? : A Lot 6 Click Score: 17    End of Session Equipment Utilized During Treatment: Oxygen;Gait belt Activity Tolerance: Patient tolerated treatment well Patient left: in bed;with call bell/phone within reach;with bed alarm set Nurse Communication: Mobility status PT Visit Diagnosis: Muscle weakness (generalized) (M62.81);Difficulty in walking, not elsewhere classified (R26.2)     Time: 6644-0347 PT Time Calculation (min) (ACUTE ONLY): 25 min  Charges:                       Desiree Hane SPT 07/30/20, 5:48 PM

## 2020-07-30 NOTE — ED Notes (Signed)
Empty Pt urinal.

## 2020-07-31 NOTE — ED Notes (Signed)
Patient refusing sequential compression stockings at this time due to una boots.

## 2020-07-31 NOTE — ED Notes (Signed)
Pt requesting to speak with MD, states "I need more damn pain medicine, getting my medicine every 8 hours is not frequent enough" Pt also c/o right foot pain that is new, per the pt. I advised the pt that we could potentially obtain labs and a urinalysis since he has been in the ED for an extended period of time, Dr Katrinka Blazing mentioned this to nursing staff earlier. Pt states "I don't need any damn labs, that is not what I am here for and yall are not poking me". Informed ED physician pt would like to speak with him.

## 2020-07-31 NOTE — ED Notes (Signed)
Pt given three cartons of milk per his request. Denies other needs at this time. Bed locked in low position, call light and personal items in reach.

## 2020-07-31 NOTE — ED Notes (Signed)
VOL  PENDING  PLACEMENT 

## 2020-07-31 NOTE — Consult Note (Addendum)
WOC Nurse follow-up consult Note: Pt wears bilat Una boots which are chaged twice a week. Removed Una boots; no open wounds or drainage to bilat legs.  Remains with generalized edema, legs remain with darker-colored skin with chronic venous stasis changes. Dressing procedure/placement/frequency: Applied bilat Una boots and coban. WOC team will plan to change dressings to legs Mon if he is still in the hospital at that time. Cammie Mcgee MSN, RN, CWOCN, Branch, CNS [phone]

## 2020-07-31 NOTE — ED Provider Notes (Signed)
Emergency Medicine Observation Re-evaluation Note  [name] [name] is a 60 y.o. male, seen on rounds today.  Pt initially presented to the ED for complaints of Weakness Currently, the patient is resting comfortably.  Physical Exam  BP (!) 119/59 (BP Location: Right Arm)   Pulse 61   Temp 98.3 F (36.8 C) (Oral)   Resp 18   Ht 5\' 8"  (1.727 m)   Wt (!) 173.7 kg   SpO2 100%   BMI 58.23 kg/m  Physical Exam Gen: No acute distress  Resp: Normal rise and fall of chest Neuro: Moving all four extremities Psych: Resting currently, calm and cooperative when awake   ED Course / MDM  EKG:EKG Interpretation  Date/Time:  Wednesday Jul 02 2020 13:17:08 EDT Ventricular Rate:  115 PR Interval:    QRS Duration: 134 QT Interval:  286 QTC Calculation: 395 R Axis:   66 Text Interpretation: Atrial fibrillation with rapid ventricular response Non-specific intra-ventricular conduction block T wave abnormality, consider inferior ischemia Abnormal ECG Confirmed by UNCONFIRMED, DOCTOR (08-31-1971), editor [id number], [name] [phone]) on 07/02/2020 2:28:15 PM  I have reviewed the labs performed to date as well as medications administered while in observation.  Recent changes in the last 24 hours include no acute events overnight.  Plan  Current plan is for social work disposition for placement.    Adriene Knipfer, 07/04/2020, DO 07/31/20 [phone]

## 2020-08-01 DIAGNOSIS — R531 Weakness: Secondary | ICD-10-CM | POA: Diagnosis not present

## 2020-08-01 DIAGNOSIS — F1029 Alcohol dependence with unspecified alcohol-induced disorder: Secondary | ICD-10-CM | POA: Diagnosis not present

## 2020-08-01 NOTE — ED Provider Notes (Signed)
Notified by social worker the patient is for discharge today   Jene Every, MD 08/01/20 1000

## 2020-08-01 NOTE — ED Notes (Signed)
Pt resting comfortably at this time. C-pap on pt at this time.

## 2020-08-01 NOTE — TOC Progression Note (Addendum)
Transition of Care [facility]) - Progression Note    Patient Details  Name: [name] [name] MRN: [id number] Date of Birth: 02-19-1960  Transition of Care [facility]) CM/SW Contact  [name] Phone Number: [phone] 08/01/2020, 5:27 PM  Clinical Narrative:     CSW spoke with Bjorn Loser [phone] at [facility]. Insurance authorization confirmed.  Patient will d/c tomorrow 08/02/2020. Patient will discharge to [facility] in [place], [place], report [phone].  please make sure the AVS is faxed by 11 AM, or they won't take him, [phone].  Please include on the AVS; wound care and CPAP info and requirements (Dr. Cyril Loosen said he was going to do it but make sure it is on the AVS) and diet requirements.      Barriers to Discharge: No Barriers Identified  Expected Discharge Plan and Services                                                 Social Determinants of Health (SDOH) Interventions    Readmission Risk Interventions Readmission Risk Prevention Plan 01/11/2020 12/24/2019 01/25/2019  Transportation Screening Complete Complete Complete  PCP or Specialist Appt within 3-5 Days - - -  HRI or Home Care Consult - - -  Social Work Consult for Recovery Care Planning/Counseling - - -  Palliative Care Screening - - -  Medication Review Oceanographer) Complete Complete Complete  PCP or Specialist appointment within 3-5 days of discharge Complete Complete Complete  HRI or Home Care Consult - - Complete  SW Recovery Care/Counseling Consult Complete Complete Complete  Palliative Care Screening Complete Not Applicable Not Applicable  Skilled Nursing Facility Not Applicable Not Applicable Not Applicable

## 2020-08-01 NOTE — TOC Progression Note (Signed)
Transition of Care [facility]) - Progression Note    Patient Details  Name: [name] [name] MRN: [id number] Date of Birth: [birth year]  Transition of Care [facility]) CM/SW Contact  [name] Phone Number: [phone] 08/01/2020, 1:52 PM  Clinical Narrative:     Patient has bed offer from [facility] Charlean Merl main contact [phone], still pending confirmation from [facility] for payor source, could take 24-48 hours.    Barriers to Discharge: No Barriers Identified  Expected Discharge Plan and Services                                                 Social Determinants of Health (SDOH) Interventions    Readmission Risk Interventions Readmission Risk Prevention Plan 01/11/2020 12/24/2019 01/25/2019  Transportation Screening Complete Complete Complete  PCP or Specialist Appt within 3-5 Days - - -  HRI or Home Care Consult - - -  Social Work Consult for Recovery Care Planning/Counseling - - -  Palliative Care Screening - - -  Medication Review Oceanographer) Complete Complete Complete  PCP or Specialist appointment within 3-5 days of discharge Complete Complete Complete  HRI or Home Care Consult - - Complete  SW Recovery Care/Counseling Consult Complete Complete Complete  Palliative Care Screening Complete Not Applicable Not Applicable  Skilled Nursing Facility Not Applicable Not Applicable Not Applicable

## 2020-08-01 NOTE — TOC Progression Note (Signed)
Transition of Care [facility]) - Progression Note    Patient Details  Name: Jeb Schloemer MRN: [id number] Date of Birth: 02/02/1961  Transition of Care [facility]) CM/SW Contact  [name] Phone Number: [phone] 08/01/2020, 8:21 AM  Clinical Narrative:     CSW spoke with Charlean Merl from [facility] [phone].  Pamelia Hoit stated she is waiting for confirmation on payor source and once she received that information she will contact this CSW. CSW updated EDP/ED Staff.    Barriers to Discharge: No Barriers Identified  Expected Discharge Plan and Services                                                 Social Determinants of Health (SDOH) Interventions    Readmission Risk Interventions Readmission Risk Prevention Plan 01/11/2020 12/24/2019 01/25/2019  Transportation Screening Complete Complete Complete  PCP or Specialist Appt within 3-5 Days - - -  HRI or Home Care Consult - - -  Social Work Consult for Recovery Care Planning/Counseling - - -  Palliative Care Screening - - -  Medication Review Oceanographer) Complete Complete Complete  PCP or Specialist appointment within 3-5 days of discharge Complete Complete Complete  HRI or Home Care Consult - - Complete  SW Recovery Care/Counseling Consult Complete Complete Complete  Palliative Care Screening Complete Not Applicable Not Applicable  Skilled Nursing Facility Not Applicable Not Applicable Not Applicable

## 2020-08-01 NOTE — Progress Notes (Signed)
Occupational Therapy Treatment Patient Details Name: [name] [name] MRN: [id number] DOB: May 16, 1960 Today's Date: 08/01/2020    History of present illness Pt is a 60 yo M with recent prior admission and plan to d/c to rehab facility but last minute he refused and went home.  Pt now arrives with blood alcohol >300, falls, reports he has manged at home well but was apparent found in his own feces; admitted with respiratory failure.   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 Pt admitted with acute on chronic RF, ETOH withdrawl, AF c RVR. Rt shoulder MRI done this admission 5/7 revealing "large full-thickness rotator cuff tear with  full-thickness retracted insertional tears of the supraspinatus and  infraspinatus tendons. Both tendons are retracted nearly to the level of the glenoid. The subscapularis and teres minor tendons appear intact".   OT comments  Pt seen for OT tx this date to f/u re: safety with ADLs/ADL mobility. UB bathing, grooming and dressing seated with SEUTP and MOD A for standing LB bathing as pt is able to complete anterior and requires MAX A for posterior. Pt requires SUPV for ADL transfers with bari RW and tolerates standing for ~5-6 mins for LB bathing tasks. Pt left in chair with all needs met and in reach. Will continue to follow. Continue to rec STR f/u for pt safety with ADLs/ADL mobility.    Follow Up Recommendations  SNF    Equipment Recommendations  Other (comment) (defer)    Recommendations for Other Services      Precautions / Restrictions Precautions Precautions: Fall Restrictions Weight Bearing Restrictions: No       Mobility Bed Mobility               General bed mobility comments: seated EOB at start of session    Transfers Overall transfer level: Needs  assistance Equipment used: Rolling walker (2 wheeled) Transfers: Sit to/from Stand Sit to Stand: Supervision Stand pivot transfers: Supervision       General transfer comment: increased time, cues for sequence/safety awareness as well as cues to initiate seated rest break when pt noted to be fatigueing as evidenced by R knee shaking    Balance Overall balance assessment: Needs assistance Sitting-balance support: Feet supported Sitting balance-Leahy Scale: Good     Standing balance support: Bilateral upper extremity supported Standing balance-Leahy Scale: Fair                             ADL either performed or assessed with clinical judgement   ADL Overall ADL's : Needs assistance/impaired     Grooming: Wash/dry face;Oral care;Set up;Sitting   Upper Body Bathing: Set up;Sitting   Lower Body Bathing: Maximal assistance;Sit to/from stand Lower Body Bathing Details (indicate cue type and reason): MAX A for posterior standing LB bathing d/t limited reach 2/2 shoulder pain and limited by body habitus       Lower Body Dressing Details (indicate cue type and reason): MAX A to don socks in sitting                     Vision Patient Visual Report: No change from baseline     Perception     Praxis      Cognition Arousal/Alertness: Awake/alert Behavior During Therapy: [facility] for tasks assessed/performed  Overall Cognitive Status: Within Functional Limits for tasks assessed                                 General Comments: requires encouragement to motivate to participate in stnading ADLs 2/2 c/o pain. Otherwise agreeable.        Exercises Other Exercises Other Exercises: OT engages pt in UB bathing, grooming and dressing seated with SEUTP and MOD A for standing LB bathing as pt is able to complete anterior and requires MAX A for posterior. Pt requires SUPV for ADL transfers with bari RW and tolerates standing for ~5-6 mins for LB bathing  tasks. Pt left in chair with all needs met and in reach.   Shoulder Instructions       General Comments      Pertinent Vitals/ Pain       Pain Assessment: 0-10 Pain Score: 7  Pain Location: reports chronic pain in R shoulder, also with c/o L hip and knee pain today Pain Descriptors / Indicators: Sore;Aching Pain Intervention(s): Monitored during session  Home Living                                          Prior Functioning/Environment              Frequency  Min 1X/week        Progress Toward Goals  OT Goals(current goals can now be found in the care plan section)  Progress towards OT goals: Progressing toward goals  Acute Rehab OT Goals Patient Stated Goal: to get stronger OT Goal Formulation: With patient Time For Goal Achievement: 08/04/20 Potential to Achieve Goals: [place] Discharge plan remains appropriate;Frequency remains appropriate    Co-evaluation                 AM-PAC OT "6 Clicks" Daily Activity     Outcome Measure   Help from another person eating meals?: None Help from another person taking care of personal grooming?: A Little Help from another person toileting, which includes using toliet, bedpan, or urinal?: A Lot Help from another person bathing (including washing, rinsing, drying)?: A Lot Help from another person to put on and taking off regular upper body clothing?: A Little Help from another person to put on and taking off regular lower body clothing?: A Lot 6 Click Score: 16    End of Session Equipment Utilized During Treatment: Oxygen  OT Visit Diagnosis: Unsteadiness on feet (R26.81);Repeated falls (R29.6);Muscle weakness (generalized) (M62.81)   Activity Tolerance Patient limited by pain   Patient Left with call bell/phone within reach;in bed   Nurse Communication Mobility status        Time: 8546-2703 OT Time Calculation (min): 41 min  Charges: OT General Charges $OT Visit: 1 Visit OT  Treatments $Self Care/Home Management : 23-37 mins $Therapeutic Activity: 8-22 mins  Gerrianne Scale, MS, OTR/L ascom [phone] 08/01/20, 1:49 PM

## 2020-08-01 NOTE — ED Provider Notes (Signed)
Emergency Medicine Observation Re-evaluation Note  [name] [name] is a 60 y.o. male, seen on rounds today.  Pt initially presented to the ED for complaints of Weakness Currently, the patient is sitting in chair, no acute distress.  Physical Exam  BP (!) 118/56   Pulse 62   Temp 98.3 F (36.8 C) (Oral)   Resp 17   Ht 5\' 8"  (1.727 m)   Wt (!) 173.7 kg   SpO2 96%   BMI 58.23 kg/m  Physical Exam General: no acute distress Cardiac: normal rate Lungs: equal chest rise Psych: calm  ED Course / MDM  EKG:EKG Interpretation  Date/Time:  Wednesday Jul 02 2020 13:17:08 EDT Ventricular Rate:  115 PR Interval:    QRS Duration: 134 QT Interval:  286 QTC Calculation: 395 R Axis:   66 Text Interpretation: Atrial fibrillation with rapid ventricular response Non-specific intra-ventricular conduction block T wave abnormality, consider inferior ischemia Abnormal ECG Confirmed by UNCONFIRMED, DOCTOR (08-31-1971), editor [id number], [name] [phone]) on 07/02/2020 2:28:15 PM  I have reviewed the labs performed to date as well as medications administered while in observation.  Recent changes in the last 24 hours include non.  Plan  Current plan is for d/c tomorrow . Patient is not under full IVC at this time.   07/04/2020, MD 08/01/20 [phone]

## 2020-08-01 NOTE — TOC Progression Note (Addendum)
Transition of Care [facility]) - Progression Note    Patient Details  Name: [name] [name] MRN: [id number] Date of Birth: Oct 05, 1960  [facility]) CM/SW Contact  [name] Phone Number: [phone] 08/01/2020, 7:29 AM  Clinical Narrative:     CSW spoke with representative w/ [facility] DSS/APS who stated the patient has been accepted for placement at [facility] in [place].    Barriers to Discharge: No Barriers Identified  Expected Discharge Plan and Services                                                 Social Determinants of Health (SDOH) Interventions    Readmission Risk Interventions Readmission Risk Prevention Plan 01/11/2020 12/24/2019 01/25/2019  Transportation Screening Complete Complete Complete  PCP or Specialist Appt within 3-5 Days - - -  HRI or Home Care Consult - - -  Social Work Consult for Recovery Care Planning/Counseling - - -  Palliative Care Screening - - -  Medication Review (RN Care Manager) Complete Complete Complete  PCP or Specialist appointment within 3-5 days of discharge Complete Complete Complete  HRI or Home Care Consult - - Complete  SW Recovery Care/Counseling Consult Complete Complete Complete  Palliative Care Screening Complete Not Applicable Not Applicable  Skilled Nursing Facility Not Applicable Not Applicable Not Applicable

## 2020-08-02 NOTE — ED Notes (Signed)
Pt given 3 pints of 2% milk

## 2020-08-02 NOTE — ED Notes (Signed)
EMS at bedside  Pt calm , collective , denied pain or sob. Discharge instructions given to [facility]  Paramedic

## 2020-08-02 NOTE — ED Notes (Signed)
Pt given soap/ water basin,  and deodorant for self care.

## 2020-08-02 NOTE — ED Notes (Signed)
VOL/pending admission to [facility] on 6/18

## 2020-08-02 NOTE — ED Notes (Signed)
Meal tray given 

## 2020-08-02 NOTE — TOC Transition Note (Signed)
Transition of Care [facility]) - CM/SW Discharge Note   Patient Details  Name: [name] [name] MRN: [id number] Date of Birth: 10/21/1960  Transition of Care [facility]) CM/SW Contact:  Verna Czech [place], [place] Phone Number:[phone] 08/02/2020, 10:25 AM   Clinical Narrative:    Patient to discharge to [facility] by [facility]. RN has called in report, unit faxed AVS , and have called [facility] for transport. [facility] unable to transport, [facility] transportation declined as well stating that they cannot travel to [facility], LCSW Transition of Care [phone]      Barriers to Discharge: No Barriers Identified   Patient Goals and CMS Choice        Discharge Placement                       Discharge Plan and Services                                     Social Determinants of Health (SDOH) Interventions     Readmission Risk Interventions Readmission Risk Prevention Plan 01/11/2020 12/24/2019 01/25/2019  Transportation Screening Complete Complete Complete  PCP or Specialist Appt within 3-5 Days - - -  HRI or Home Care Consult - - -  Social Work Consult for Recovery Care Planning/Counseling - - -  Palliative Care Screening - - -  Medication Review Oceanographer) Complete Complete Complete  PCP or Specialist appointment within 3-5 days of discharge Complete Complete Complete  HRI or Home Care Consult - - Complete  SW Recovery Care/Counseling Consult Complete Complete Complete  Palliative Care Screening Complete Not Applicable Not Applicable  Skilled Nursing Facility Not Applicable Not Applicable Not Applicable

## 2020-08-04 DIAGNOSIS — Z79899 Other long term (current) drug therapy: Secondary | ICD-10-CM | POA: Diagnosis not present

## 2020-08-04 DIAGNOSIS — R5381 Other malaise: Secondary | ICD-10-CM | POA: Diagnosis not present

## 2020-08-04 DIAGNOSIS — G4733 Obstructive sleep apnea (adult) (pediatric): Secondary | ICD-10-CM | POA: Diagnosis not present

## 2020-08-04 LAB — CBG MONITORING, ED: Glucose-Capillary: 127 mg/dL — ABNORMAL HIGH (ref 70–99)

## 2020-08-06 DIAGNOSIS — R5381 Other malaise: Secondary | ICD-10-CM | POA: Diagnosis not present

## 2020-08-06 NOTE — Progress Notes (Addendum)
08/06/20 9am:  CSW received call from Diamond at [facility] [phone]). She stated patient just discharged to their facility over the weekend and she is requesting CSW fax her notes from MD that she cannot find in her information. CSW faxed requested notes securely to her at f. [phone].  Joaquin Courts, MSW, [facility]

## 2020-08-08 DIAGNOSIS — R5381 Other malaise: Secondary | ICD-10-CM | POA: Diagnosis not present

## 2020-08-13 DIAGNOSIS — R5381 Other malaise: Secondary | ICD-10-CM | POA: Diagnosis not present

## 2020-08-15 DIAGNOSIS — R5381 Other malaise: Secondary | ICD-10-CM | POA: Diagnosis not present

## 2020-08-20 DIAGNOSIS — R5381 Other malaise: Secondary | ICD-10-CM | POA: Diagnosis not present

## 2020-08-26 ENCOUNTER — Ambulatory Visit: Payer: Medicaid Other | Admitting: Family Medicine

## 2020-08-27 ENCOUNTER — Other Ambulatory Visit: Payer: Self-pay | Admitting: Adult Health

## 2020-08-30 ENCOUNTER — Other Ambulatory Visit: Payer: Self-pay

## 2020-08-30 ENCOUNTER — Inpatient Hospital Stay
Admission: EM | Admit: 2020-08-30 | Discharge: 2020-09-05 | DRG: 291 | Disposition: A | Discharge status: Home Health | Payer: Medicaid Other | Attending: Family Medicine | Admitting: Family Medicine

## 2020-08-30 ENCOUNTER — Inpatient Hospital Stay (HOSPITAL_COMMUNITY)
Admit: 2020-08-30 | Discharge: 2020-08-30 | Disposition: A | Discharge status: Home / Self Care | Payer: Medicaid Other | Attending: Internal Medicine | Admitting: Internal Medicine

## 2020-08-30 ENCOUNTER — Inpatient Hospital Stay: Payer: Medicaid Other

## 2020-08-30 ENCOUNTER — Emergency Department: Payer: Medicaid Other

## 2020-08-30 DIAGNOSIS — F3341 Major depressive disorder, recurrent, in partial remission: Secondary | ICD-10-CM | POA: Diagnosis not present

## 2020-08-30 DIAGNOSIS — G9341 Metabolic encephalopathy: Secondary | ICD-10-CM | POA: Diagnosis present

## 2020-08-30 DIAGNOSIS — F10239 Alcohol dependence with withdrawal, unspecified: Secondary | ICD-10-CM | POA: Diagnosis not present

## 2020-08-30 DIAGNOSIS — I5023 Acute on chronic systolic (congestive) heart failure: Secondary | ICD-10-CM | POA: Diagnosis present

## 2020-08-30 DIAGNOSIS — Z86718 Personal history of other venous thrombosis and embolism: Secondary | ICD-10-CM

## 2020-08-30 DIAGNOSIS — U071 COVID-19: Secondary | ICD-10-CM | POA: Diagnosis present

## 2020-08-30 DIAGNOSIS — R0689 Other abnormalities of breathing: Secondary | ICD-10-CM | POA: Diagnosis not present

## 2020-08-30 DIAGNOSIS — I248 Other forms of acute ischemic heart disease: Secondary | ICD-10-CM | POA: Diagnosis present

## 2020-08-30 DIAGNOSIS — I2699 Other pulmonary embolism without acute cor pulmonale: Secondary | ICD-10-CM | POA: Diagnosis present

## 2020-08-30 DIAGNOSIS — M79604 Pain in right leg: Secondary | ICD-10-CM | POA: Diagnosis not present

## 2020-08-30 DIAGNOSIS — R1110 Vomiting, unspecified: Secondary | ICD-10-CM | POA: Diagnosis not present

## 2020-08-30 DIAGNOSIS — I110 Hypertensive heart disease with heart failure: Secondary | ICD-10-CM | POA: Diagnosis not present

## 2020-08-30 DIAGNOSIS — I252 Old myocardial infarction: Secondary | ICD-10-CM

## 2020-08-30 DIAGNOSIS — J9621 Acute and chronic respiratory failure with hypoxia: Secondary | ICD-10-CM | POA: Diagnosis present

## 2020-08-30 DIAGNOSIS — G4733 Obstructive sleep apnea (adult) (pediatric): Secondary | ICD-10-CM | POA: Diagnosis present

## 2020-08-30 DIAGNOSIS — I2510 Atherosclerotic heart disease of native coronary artery without angina pectoris: Secondary | ICD-10-CM | POA: Diagnosis not present

## 2020-08-30 DIAGNOSIS — E039 Hypothyroidism, unspecified: Secondary | ICD-10-CM | POA: Diagnosis present

## 2020-08-30 DIAGNOSIS — J432 Centrilobular emphysema: Secondary | ICD-10-CM | POA: Diagnosis not present

## 2020-08-30 DIAGNOSIS — Z9981 Dependence on supplemental oxygen: Secondary | ICD-10-CM

## 2020-08-30 DIAGNOSIS — E876 Hypokalemia: Secondary | ICD-10-CM | POA: Diagnosis not present

## 2020-08-30 DIAGNOSIS — E1165 Type 2 diabetes mellitus with hyperglycemia: Secondary | ICD-10-CM | POA: Diagnosis present

## 2020-08-30 DIAGNOSIS — I509 Heart failure, unspecified: Secondary | ICD-10-CM

## 2020-08-30 DIAGNOSIS — I10 Essential (primary) hypertension: Secondary | ICD-10-CM | POA: Diagnosis present

## 2020-08-30 DIAGNOSIS — I5021 Acute systolic (congestive) heart failure: Secondary | ICD-10-CM | POA: Diagnosis not present

## 2020-08-30 DIAGNOSIS — M109 Gout, unspecified: Secondary | ICD-10-CM | POA: Diagnosis present

## 2020-08-30 DIAGNOSIS — I4819 Other persistent atrial fibrillation: Secondary | ICD-10-CM | POA: Diagnosis present

## 2020-08-30 DIAGNOSIS — R778 Other specified abnormalities of plasma proteins: Secondary | ICD-10-CM | POA: Diagnosis not present

## 2020-08-30 DIAGNOSIS — M170 Bilateral primary osteoarthritis of knee: Secondary | ICD-10-CM | POA: Diagnosis present

## 2020-08-30 DIAGNOSIS — D6959 Other secondary thrombocytopenia: Secondary | ICD-10-CM | POA: Diagnosis present

## 2020-08-30 DIAGNOSIS — E785 Hyperlipidemia, unspecified: Secondary | ICD-10-CM | POA: Diagnosis not present

## 2020-08-30 DIAGNOSIS — J441 Chronic obstructive pulmonary disease with (acute) exacerbation: Secondary | ICD-10-CM | POA: Diagnosis present

## 2020-08-30 DIAGNOSIS — F1010 Alcohol abuse, uncomplicated: Secondary | ICD-10-CM | POA: Diagnosis not present

## 2020-08-30 DIAGNOSIS — R600 Localized edema: Secondary | ICD-10-CM | POA: Diagnosis not present

## 2020-08-30 DIAGNOSIS — L03116 Cellulitis of left lower limb: Secondary | ICD-10-CM | POA: Diagnosis present

## 2020-08-30 DIAGNOSIS — F419 Anxiety disorder, unspecified: Secondary | ICD-10-CM | POA: Diagnosis present

## 2020-08-30 DIAGNOSIS — I428 Other cardiomyopathies: Secondary | ICD-10-CM | POA: Diagnosis present

## 2020-08-30 DIAGNOSIS — M1A49X0 Other secondary chronic gout, multiple sites, without tophus (tophi): Secondary | ICD-10-CM | POA: Diagnosis not present

## 2020-08-30 DIAGNOSIS — I5082 Biventricular heart failure: Secondary | ICD-10-CM | POA: Diagnosis present

## 2020-08-30 DIAGNOSIS — J449 Chronic obstructive pulmonary disease, unspecified: Secondary | ICD-10-CM | POA: Diagnosis present

## 2020-08-30 DIAGNOSIS — Z7901 Long term (current) use of anticoagulants: Secondary | ICD-10-CM

## 2020-08-30 DIAGNOSIS — I890 Lymphedema, not elsewhere classified: Secondary | ICD-10-CM | POA: Diagnosis present

## 2020-08-30 DIAGNOSIS — I700 Atherosclerosis of aorta: Secondary | ICD-10-CM | POA: Diagnosis not present

## 2020-08-30 DIAGNOSIS — Z8249 Family history of ischemic heart disease and other diseases of the circulatory system: Secondary | ICD-10-CM

## 2020-08-30 DIAGNOSIS — M160 Bilateral primary osteoarthritis of hip: Secondary | ICD-10-CM | POA: Diagnosis present

## 2020-08-30 DIAGNOSIS — I482 Chronic atrial fibrillation, unspecified: Secondary | ICD-10-CM | POA: Diagnosis present

## 2020-08-30 DIAGNOSIS — R079 Chest pain, unspecified: Secondary | ICD-10-CM | POA: Diagnosis not present

## 2020-08-30 DIAGNOSIS — F341 Dysthymic disorder: Secondary | ICD-10-CM | POA: Diagnosis not present

## 2020-08-30 DIAGNOSIS — M25511 Pain in right shoulder: Secondary | ICD-10-CM | POA: Diagnosis present

## 2020-08-30 DIAGNOSIS — R001 Bradycardia, unspecified: Secondary | ICD-10-CM | POA: Diagnosis not present

## 2020-08-30 DIAGNOSIS — R109 Unspecified abdominal pain: Secondary | ICD-10-CM | POA: Diagnosis not present

## 2020-08-30 DIAGNOSIS — J811 Chronic pulmonary edema: Secondary | ICD-10-CM | POA: Diagnosis not present

## 2020-08-30 DIAGNOSIS — Z79890 Hormone replacement therapy (postmenopausal): Secondary | ICD-10-CM

## 2020-08-30 DIAGNOSIS — Z79899 Other long term (current) drug therapy: Secondary | ICD-10-CM

## 2020-08-30 DIAGNOSIS — G8929 Other chronic pain: Secondary | ICD-10-CM | POA: Diagnosis present

## 2020-08-30 DIAGNOSIS — K219 Gastro-esophageal reflux disease without esophagitis: Secondary | ICD-10-CM | POA: Diagnosis present

## 2020-08-30 DIAGNOSIS — R609 Edema, unspecified: Secondary | ICD-10-CM

## 2020-08-30 DIAGNOSIS — Z6841 Body Mass Index (BMI) 40.0 and over, adult: Secondary | ICD-10-CM | POA: Diagnosis not present

## 2020-08-30 DIAGNOSIS — Z86711 Personal history of pulmonary embolism: Secondary | ICD-10-CM

## 2020-08-30 DIAGNOSIS — R739 Hyperglycemia, unspecified: Secondary | ICD-10-CM | POA: Diagnosis not present

## 2020-08-30 DIAGNOSIS — F32A Depression, unspecified: Secondary | ICD-10-CM | POA: Diagnosis present

## 2020-08-30 DIAGNOSIS — I517 Cardiomegaly: Secondary | ICD-10-CM | POA: Diagnosis not present

## 2020-08-30 LAB — BASIC METABOLIC PANEL
Anion gap: 13 (ref 5–15)
BUN: 14 mg/dL (ref 6–20)
CO2: 29 mmol/L (ref 22–32)
Calcium: 8.4 mg/dL — ABNORMAL LOW (ref 8.9–10.3)
Chloride: 94 mmol/L — ABNORMAL LOW (ref 98–111)
Creatinine, Ser: 0.55 mg/dL — ABNORMAL LOW (ref 0.61–1.24)
GFR, Estimated: >60 mL/min (ref >60)
Glucose, Bld: 159 mg/dL — ABNORMAL HIGH (ref 70–99)
Potassium: 3.4 mmol/L — ABNORMAL LOW (ref 3.5–5.1)
Sodium: 136 mmol/L (ref 135–145)

## 2020-08-30 LAB — BRAIN NATRIURETIC PEPTIDE: B Natriuretic Peptide: 442.3 pg/mL — ABNORMAL HIGH (ref 0.0–100.0)

## 2020-08-30 LAB — CBC
HCT: 34 % — ABNORMAL LOW (ref 39.0–52.0)
Hemoglobin: 11.3 g/dL — ABNORMAL LOW (ref 13.0–17.0)
MCH: 30.1 pg (ref 26.0–34.0)
MCHC: 33.2 g/dL (ref 30.0–36.0)
MCV: 90.4 fL (ref 80.0–100.0)
Platelets: 194 10*3/uL (ref 150–400)
RBC: 3.76 MIL/uL — ABNORMAL LOW (ref 4.22–5.81)
RDW: 15.6 % — ABNORMAL HIGH (ref 11.5–15.5)
WBC: 8.5 10*3/uL (ref 4.0–10.5)
nRBC: 0 % (ref 0.0–0.2)

## 2020-08-30 LAB — TROPONIN I (HIGH SENSITIVITY)
Troponin I (High Sensitivity): 48 ng/L — ABNORMAL HIGH (ref <18)
Troponin I (High Sensitivity): 40 ng/L — ABNORMAL HIGH (ref <18)
Troponin I (High Sensitivity): 38 ng/L — ABNORMAL HIGH (ref <18)
Troponin I (High Sensitivity): 39 ng/L — ABNORMAL HIGH (ref <18)

## 2020-08-30 LAB — LIPASE, BLOOD: Lipase: 24 U/L (ref 11–51)

## 2020-08-30 LAB — MAGNESIUM: Magnesium: 1.7 mg/dL (ref 1.7–2.4)

## 2020-08-30 LAB — PROTIME-INR
INR: 1.1 (ref 0.8–1.2)
Prothrombin Time: 14.3 seconds (ref 11.4–15.2)

## 2020-08-30 LAB — GLUCOSE, CAPILLARY: Glucose-Capillary: 155 mg/dL — ABNORMAL HIGH (ref 70–99)

## 2020-08-30 MED ORDER — IOHEXOL 300 MG/ML  SOLN
100.0000 mL | Freq: Once | INTRAMUSCULAR | Status: AC | PRN
  Administered 2020-08-30: 100 mL via INTRAVENOUS

## 2020-08-30 MED ORDER — PERFLUTREN LIPID MICROSPHERE
1.0000 mL | INTRAVENOUS | Status: AC | PRN
  Administered 2020-08-30: 6 mL via INTRAVENOUS
  Filled 2020-08-30: qty 10

## 2020-08-30 MED ORDER — HYDRALAZINE HCL 20 MG/ML IJ SOLN: 5.0000 mg | INTRAMUSCULAR | Status: DC | PRN

## 2020-08-30 MED ORDER — NITROGLYCERIN 0.4 MG SL SUBL: 0.4000 mg | SUBLINGUAL_TABLET | SUBLINGUAL | Status: DC | PRN

## 2020-08-30 MED ORDER — METHOCARBAMOL 750 MG PO TABS
750.0000 mg | ORAL_TABLET | Freq: Three times a day (TID) | ORAL | Status: DC
  Administered 2020-08-30 – 2020-08-31 (×3): 750 mg via ORAL
  Filled 2020-08-30 (×5): qty 1

## 2020-08-30 MED ORDER — FUROSEMIDE 10 MG/ML IJ SOLN
60.0000 mg | Freq: Two times a day (BID) | INTRAMUSCULAR | Status: DC
  Administered 2020-08-30 – 2020-09-03 (×8): 60 mg via INTRAVENOUS
  Filled 2020-08-30 (×9): qty 6

## 2020-08-30 MED ORDER — LORAZEPAM 1 MG PO TABS: 1.0000 mg | ORAL_TABLET | ORAL | Status: DC | PRN

## 2020-08-30 MED ORDER — METOPROLOL TARTRATE 50 MG PO TABS
100.0000 mg | ORAL_TABLET | Freq: Two times a day (BID) | ORAL | Status: DC
  Administered 2020-08-30 – 2020-09-05 (×11): 100 mg via ORAL
  Filled 2020-08-30 (×12): qty 2

## 2020-08-30 MED ORDER — LORAZEPAM 2 MG/ML IJ SOLN
0.0000 mg | Freq: Four times a day (QID) | INTRAMUSCULAR | Status: DC
  Administered 2020-08-30 (×2): 1 mg via INTRAVENOUS
  Administered 2020-08-30: 2 mg via INTRAVENOUS
  Administered 2020-08-31: 1 mg via INTRAVENOUS
  Administered 2020-08-31: 2 mg via INTRAVENOUS
  Administered 2020-08-31: 4 mg via INTRAVENOUS
  Administered 2020-08-31: 1 mg via INTRAVENOUS
  Filled 2020-08-30: qty 1
  Filled 2020-08-30: qty 2
  Filled 2020-08-30 (×5): qty 1

## 2020-08-30 MED ORDER — ALLOPURINOL 100 MG PO TABS
100.0000 mg | ORAL_TABLET | Freq: Every day | ORAL | Status: DC
  Administered 2020-08-30 – 2020-08-31 (×2): 100 mg via ORAL
  Filled 2020-08-30 (×3): qty 1

## 2020-08-30 MED ORDER — ADULT MULTIVITAMIN W/MINERALS CH: 1.0000 | ORAL_TABLET | Freq: Every day | ORAL | Status: DC

## 2020-08-30 MED ORDER — DM-GUAIFENESIN ER 30-600 MG PO TB12: 1.0000 | ORAL_TABLET | Freq: Two times a day (BID) | ORAL | Status: DC | PRN

## 2020-08-30 MED ORDER — FUROSEMIDE 10 MG/ML IJ SOLN
40.0000 mg | Freq: Once | INTRAMUSCULAR | Status: DC
  Filled 2020-08-30: qty 4

## 2020-08-30 MED ORDER — LISINOPRIL 5 MG PO TABS
2.5000 mg | ORAL_TABLET | Freq: Every day | ORAL | Status: DC
  Administered 2020-08-30: 2.5 mg via ORAL
  Filled 2020-08-30: qty 1

## 2020-08-30 MED ORDER — THIAMINE HCL 100 MG/ML IJ SOLN: 100.0000 mg | Freq: Every day | INTRAMUSCULAR | Status: DC

## 2020-08-30 MED ORDER — ACETAMINOPHEN 325 MG PO TABS
650.0000 mg | ORAL_TABLET | Freq: Four times a day (QID) | ORAL | Status: DC | PRN
  Administered 2020-09-04: 650 mg via ORAL
  Filled 2020-08-30: qty 2

## 2020-08-30 MED ORDER — ALBUTEROL SULFATE (2.5 MG/3ML) 0.083% IN NEBU
3.0000 mL | INHALATION_SOLUTION | RESPIRATORY_TRACT | Status: DC | PRN
  Administered 2020-08-30: 3 mL via RESPIRATORY_TRACT
  Filled 2020-08-30 (×2): qty 3

## 2020-08-30 MED ORDER — MECLIZINE HCL 12.5 MG PO TABS
12.5000 mg | ORAL_TABLET | Freq: Three times a day (TID) | ORAL | Status: DC | PRN
  Filled 2020-08-30: qty 2

## 2020-08-30 MED ORDER — ONDANSETRON HCL 4 MG/2ML IJ SOLN
INTRAMUSCULAR | Status: AC
  Filled 2020-08-30: qty 2

## 2020-08-30 MED ORDER — THIAMINE HCL 100 MG PO TABS
100.0000 mg | ORAL_TABLET | Freq: Every day | ORAL | Status: DC
  Administered 2020-08-30 – 2020-08-31 (×2): 100 mg via ORAL
  Filled 2020-08-30 (×2): qty 1

## 2020-08-30 MED ORDER — PANTOPRAZOLE SODIUM 40 MG PO TBEC
40.0000 mg | DELAYED_RELEASE_TABLET | Freq: Every day | ORAL | Status: DC
  Administered 2020-08-30 – 2020-09-04 (×4): 40 mg via ORAL
  Filled 2020-08-30 (×4): qty 1

## 2020-08-30 MED ORDER — NAPHAZOLINE-GLYCERIN 0.012-0.25 % OP SOLN
1.0000 [drp] | Freq: Four times a day (QID) | OPHTHALMIC | Status: DC | PRN
Start: 2020-08-30 — End: 2020-09-05
  Filled 2020-08-30: qty 15

## 2020-08-30 MED ORDER — DICLOFENAC SODIUM 1 % EX GEL
4.0000 g | Freq: Four times a day (QID) | CUTANEOUS | Status: DC | PRN
  Filled 2020-08-30: qty 100

## 2020-08-30 MED ORDER — ONDANSETRON HCL 4 MG/2ML IJ SOLN
4.0000 mg | Freq: Once | INTRAMUSCULAR | Status: AC
  Administered 2020-08-30: 4 mg via INTRAVENOUS

## 2020-08-30 MED ORDER — PANTOPRAZOLE SODIUM 40 MG PO TBEC: 40.0000 mg | DELAYED_RELEASE_TABLET | Freq: Every day | ORAL | Status: DC

## 2020-08-30 MED ORDER — DULOXETINE HCL 30 MG PO CPEP
30.0000 mg | ORAL_CAPSULE | Freq: Every day | ORAL | Status: DC
  Administered 2020-08-30 – 2020-08-31 (×2): 30 mg via ORAL
  Filled 2020-08-30 (×2): qty 1

## 2020-08-30 MED ORDER — COLCHICINE 0.6 MG PO TABS
0.6000 mg | ORAL_TABLET | Freq: Every day | ORAL | Status: DC
  Administered 2020-08-30: 0.6 mg via ORAL
  Filled 2020-08-30: qty 1

## 2020-08-30 MED ORDER — MONTELUKAST SODIUM 10 MG PO TABS
10.0000 mg | ORAL_TABLET | Freq: Every day | ORAL | Status: DC
  Administered 2020-08-30 – 2020-09-04 (×5): 10 mg via ORAL
  Filled 2020-08-30 (×6): qty 1

## 2020-08-30 MED ORDER — ONDANSETRON HCL 4 MG/2ML IJ SOLN
4.0000 mg | Freq: Three times a day (TID) | INTRAMUSCULAR | Status: DC | PRN
  Administered 2020-08-30: 4 mg via INTRAVENOUS
  Filled 2020-08-30: qty 2

## 2020-08-30 MED ORDER — TRAZODONE HCL 100 MG PO TABS
150.0000 mg | ORAL_TABLET | Freq: Every day | ORAL | Status: DC
  Administered 2020-08-30: 150 mg via ORAL
  Filled 2020-08-30: qty 1

## 2020-08-30 MED ORDER — POTASSIUM CHLORIDE CRYS ER 20 MEQ PO TBCR
40.0000 meq | EXTENDED_RELEASE_TABLET | ORAL | Status: AC
  Administered 2020-08-30 (×2): 40 meq via ORAL
  Filled 2020-08-30 (×2): qty 2

## 2020-08-30 MED ORDER — LEVOTHYROXINE SODIUM 50 MCG PO TABS
75.0000 ug | ORAL_TABLET | Freq: Every day | ORAL | Status: DC
  Administered 2020-08-31: 75 ug via ORAL
  Filled 2020-08-30: qty 1
  Filled 2020-08-30: qty 2

## 2020-08-30 MED ORDER — OXYCODONE-ACETAMINOPHEN 7.5-325 MG PO TABS
1.0000 | ORAL_TABLET | Freq: Three times a day (TID) | ORAL | Status: DC | PRN
Start: 2020-08-30 — End: 2020-09-05
  Administered 2020-08-30 – 2020-09-05 (×10): 1 via ORAL
  Filled 2020-08-30 (×11): qty 1

## 2020-08-30 MED ORDER — GABAPENTIN 300 MG PO CAPS: 300.0000 mg | ORAL_CAPSULE | Freq: Three times a day (TID) | ORAL | Status: DC

## 2020-08-30 MED ORDER — MORPHINE SULFATE (PF) 2 MG/ML IV SOLN
2.0000 mg | INTRAVENOUS | Status: DC | PRN
  Administered 2020-08-30 – 2020-09-03 (×4): 2 mg via INTRAVENOUS
  Filled 2020-08-30 (×5): qty 1

## 2020-08-30 MED ORDER — WARFARIN SODIUM 7.5 MG PO TABS
7.5000 mg | ORAL_TABLET | Freq: Once | ORAL | Status: AC
  Administered 2020-08-30: 7.5 mg via ORAL
  Filled 2020-08-30: qty 1

## 2020-08-30 MED ORDER — LORAZEPAM 2 MG/ML IJ SOLN: 0.0000 mg | Freq: Two times a day (BID) | INTRAMUSCULAR | Status: DC

## 2020-08-30 MED ORDER — FOLIC ACID 1 MG PO TABS
1.0000 mg | ORAL_TABLET | Freq: Every day | ORAL | Status: DC
  Administered 2020-08-30 – 2020-08-31 (×2): 1 mg via ORAL
  Filled 2020-08-30 (×2): qty 1

## 2020-08-30 MED ORDER — WARFARIN - PHARMACIST DOSING INPATIENT: Freq: Every day | Status: DC

## 2020-08-30 MED ORDER — ATORVASTATIN CALCIUM 20 MG PO TABS
40.0000 mg | ORAL_TABLET | Freq: Every day | ORAL | Status: DC
  Administered 2020-08-30 – 2020-08-31 (×2): 40 mg via ORAL
  Filled 2020-08-30 (×2): qty 2

## 2020-08-30 MED ORDER — LORAZEPAM 2 MG/ML IJ SOLN
1.0000 mg | INTRAMUSCULAR | Status: DC | PRN
  Administered 2020-09-01: 3 mg via INTRAVENOUS

## 2020-08-30 MED ORDER — SUMATRIPTAN SUCCINATE 50 MG PO TABS
50.0000 mg | ORAL_TABLET | ORAL | Status: DC | PRN
  Administered 2020-08-30: 50 mg via ORAL
  Filled 2020-08-30 (×2): qty 1

## 2020-08-30 MED ORDER — ADULT MULTIVITAMIN W/MINERALS CH
1.0000 | ORAL_TABLET | Freq: Every day | ORAL | Status: DC
  Administered 2020-08-30 – 2020-08-31 (×2): 1 via ORAL
  Filled 2020-08-30 (×2): qty 1

## 2020-08-30 NOTE — ED Triage Notes (Signed)
Pt arrives via EMS from home after having increased SHOB- per EMS pt was 95% on his chronic 3L [place]- pt was also vomiting on the ems stretcher

## 2020-08-30 NOTE — ED Notes (Signed)
Pt taken for CT 

## 2020-08-30 NOTE — Progress Notes (Addendum)
ANTICOAGULATION CONSULT NOTE  Pharmacy Consult for Warfarin Indication: atrial fibrillation and pulmonary embolus  No Known Allergies  Patient Measurements: Height: 5\' 8"  (172.7 cm) Weight: (!) 181.9 kg (401 lb 1.6 oz) IBW/kg (Calculated) : 68.4  Vital Signs: Temp: 98.7 F (37.1 C) (07/16 1130) Temp Source: Oral (07/16 1130) BP: 127/92 (07/16 1130) Pulse Rate: 100 (07/16 1130)  Labs: Recent Labs    08/30/20 0819 08/30/20 1203 08/30/20 1415  HGB 11.3*  --   --   HCT 34.0*  --   --   PLT 194  --   --   LABPROT 14.3  --   --   INR 1.1  --   --   CREATININE 0.55*  --   --   TROPONINIHS 48* 40* 38*     Estimated Creatinine Clearance: 160 mL/min (A) (by C-G formula based on SCr of 0.55 mg/dL (L)).   Medical History: Past Medical History:  Diagnosis Date   Allergy    Anxiety    Arthritis    Asthma    Brain damage    Chronic pain of both knees 07/13/2018   Clotting disorder (HCC)    COPD (chronic obstructive pulmonary disease) (HCC)    Depression    GERD (gastroesophageal reflux disease)    HFrEF (heart failure with reduced ejection fraction) (HCC)    a. 03/2018 Echo: EF 25-30%, diff HK. Mod LAE.   History of DVT (deep vein thrombosis)    History of pulmonary embolism    a. Chronic coumadin.   Hypertension    MI (myocardial infarction) (HCC)    Morbid obesity (HCC)    Neck pain 07/13/2018   NICM (nonischemic cardiomyopathy) (HCC)    a. s/p Cath x 3 - reportedly nl cors. Last cath 2019 in [place]; b. a. 03/2018 Echo: EF 25-30%, diff HK.   Persistent atrial fibrillation (HCC)    a. 03/2018 s/p DCCV; b. CHA2DS2VASc = 1-->Xarelto (later changed to warfarin); c. 05/2018 recurrent afib-->Amio initiated.   Sleep apnea    Sleep apnea     Medications:  Pharmacy technician spoke with pt who confirmed pt takes warfarin 5 mg daily and his last dose was 7/15.   Assessment: 60 y.o. male with medical history significant of dCHF, hypertension, hyperlipidemia, diet-controlled  diabetes, COPD, hypothyroidism, gout, depression, anxiety, OSA on CPAP, atrial fibrillation, PE/DVT on Coumadin, CAD, alcohol abuse, obesity, who presents with shortness of breath. Pt has a history of being subtherapeutic on warfarin and had been switched to Eliquis due to non-compliance with INR checks, however, now pt is back on warfarin. Pharmacy has been consulted for dosing/monitoring of warfarin.  Date INR Dose/Comment 7/16 1.1   Goal of Therapy:  INR 2-3 Monitor platelets by anticoagulation protocol: Yes   Plan:  INR subtherapeutic, will order warfarin 7.5 mg tonight Monitor CBC and INR daily    8/16, PharmD Clinical Pharmacist 08/30/2020,3:01 PM

## 2020-08-30 NOTE — ED Notes (Signed)
X-ray at bedside

## 2020-08-30 NOTE — Progress Notes (Deleted)
ANTICOAGULATION CONSULT NOTE  Pharmacy Consult for Warfarin Indication: atrial fibrillation and pulmonary embolus  No Known Allergies  Patient Measurements: Height: 5\' 8"  (172.7 cm) Weight: (!) 158.8 kg (350 lb) IBW/kg (Calculated) : 68.4  Vital Signs: Temp: 98.7 F (37.1 C) (07/16 1130) Temp Source: Oral (07/16 1130) BP: 127/92 (07/16 1130) Pulse Rate: 100 (07/16 1130)  Labs: Recent Labs    08/30/20 0819 08/30/20 1203  HGB 11.3*  --   HCT 34.0*  --   PLT 194  --   CREATININE 0.55*  --   TROPONINIHS 48* 40*    Estimated Creatinine Clearance: 147.1 mL/min (A) (by C-G formula based on SCr of 0.55 mg/dL (L)).   Medical History: Past Medical History:  Diagnosis Date   Allergy    Anxiety    Arthritis    Asthma    Brain damage    Chronic pain of both knees 07/13/2018   Clotting disorder (HCC)    COPD (chronic obstructive pulmonary disease) (HCC)    Depression    GERD (gastroesophageal reflux disease)    HFrEF (heart failure with reduced ejection fraction) (HCC)    a. 03/2018 Echo: EF 25-30%, diff HK. Mod LAE.   History of DVT (deep vein thrombosis)    History of pulmonary embolism    a. Chronic coumadin.   Hypertension    MI (myocardial infarction) (HCC)    Morbid obesity (HCC)    Neck pain 07/13/2018   NICM (nonischemic cardiomyopathy) (HCC)    a. s/p Cath x 3 - reportedly nl cors. Last cath 2019 in [place]; b. a. 03/2018 Echo: EF 25-30%, diff HK.   Persistent atrial fibrillation (HCC)    a. 03/2018 s/p DCCV; b. CHA2DS2VASc = 1-->Xarelto (later changed to warfarin); c. 05/2018 recurrent afib-->Amio initiated.   Sleep apnea    Sleep apnea     Medications:  Pharmacy technician spoke with pt who confirmed pt takes warfarin 5 mg daily and his last dose was 7/15.   Assessment: 60 y.o. male with medical history significant of dCHF, hypertension, hyperlipidemia, diet-controlled diabetes, COPD, hypothyroidism, gout, depression, anxiety, OSA on CPAP, atrial fibrillation,  PE/DVT on Coumadin, CAD, alcohol abuse, obesity, who presents with shortness of breath. Pt has been on Eliquis in the past but is now on warfarin. Pharmacy has been consulted for dosing/monitoring of warfarin.   Date INR Dose/Comment 7/16 1.1   Goal of Therapy:  INR 2-3 Monitor platelets by anticoagulation protocol: Yes   Plan:  INR subtherapeutic, will order warfarin 7.5 mg tonight Monitor CBC and INR daily    8/16, PharmD Clinical Pharmacist 08/30/2020,1:33 PM

## 2020-08-30 NOTE — ED Notes (Signed)
Pt given warm blanket.

## 2020-08-30 NOTE — H&P (Addendum)
History and Physical    Ricardo Schubach ZOX:[id number] DOB: 10/13/1960 DOA: 08/30/2020  Referring MD/NP/PA:   PCP: [name], DO   Patient coming from:  The patient is coming from home.  At baseline, pt is independent for most of ADL.        Chief Complaint: SOB  HPI: [name] [name] is a 60 y.o. male with medical history significant of dCHF, hypertension, hyperlipidemia, diet-controlled diabetes, COPD, hypothyroidism, gout, depression, anxiety, OSA on CPAP, atrial fibrillation, PE/DVT on Coumadin, CAD, alcohol abuse, obesity, who presents with shortness of breath.   Patient states that he started having shortness of breath since this morning, which is of been progressively worsening.  Patient has mild cough, no fever or chills.  He has mild pressure-like chest pain in substernal area, which is intermittent, nonradiating.  He also reports nausea, vomiting and lower abdominal pain.  He has vomited 3 times with nonbilious nonbloody vomiting.  The lower abdominal pain is mild to moderate, sharp, constant, nonradiating. No diarrhea.  Denies symptoms of UTI. Pt has bilateral leg edema  ED Course: pt was found to have BNP 443, troponin level 48, pending COVID-19 PCR, potassium 3.4, renal function okay, temperature normal, blood pressure 161/109, heart rate 100s, RR 24, oxygen saturation 96% on home level of 3 L oxygen.  Chest x-ray showed cardiomegaly and pulmonary edema.  Patient is admitted to progressive bed as inpatient.  Review of Systems:   General: no fevers, chills, no body weight gain, has fatigue HEENT: no blurry vision, hearing changes or sore throat Respiratory: has dyspnea, coughing, no wheezing CV: has chest pain, no palpitations GI: has nausea, vomiting, abdominal pain, no diarrhea, constipation GU: no dysuria, burning on urination, increased urinary frequency, hematuria  Ext: has leg edema Neuro: no unilateral weakness, numbness, or tingling, no  vision change or hearing loss Skin: no rash, no skin tear. MSK: No muscle spasm, no deformity, no limitation of range of movement in spin Heme: No easy bruising.  Travel history: No recent long distant travel.  Allergy: No Known Allergies  Past Medical History:  Diagnosis Date   Allergy    Anxiety    Arthritis    Asthma    Brain damage    Chronic pain of both knees 07/13/2018   Clotting disorder (HCC)    COPD (chronic obstructive pulmonary disease) (HCC)    Depression    GERD (gastroesophageal reflux disease)    HFrEF (heart failure with reduced ejection fraction) (HCC)    a. 03/2018 Echo: EF 25-30%, diff HK. Mod LAE.   History of DVT (deep vein thrombosis)    History of pulmonary embolism    a. Chronic coumadin.   Hypertension    MI (myocardial infarction) (HCC)    Morbid obesity (HCC)    Neck pain 07/13/2018   NICM (nonischemic cardiomyopathy) (HCC)    a. s/p Cath x 3 - reportedly nl cors. Last cath 2019 in [place]; b. a. 03/2018 Echo: EF 25-30%, diff HK.   Persistent atrial fibrillation (HCC)    a. 03/2018 s/p DCCV; b. CHA2DS2VASc = 1-->Xarelto (later changed to warfarin); c. 05/2018 recurrent afib-->Amio initiated.   Sleep apnea    Sleep apnea     Past Surgical History:  Procedure Laterality Date   CARDIOVERSION N/A 03/24/2018   Procedure: CARDIOVERSION;  Surgeon: Antonieta Iba, MD;  Location: [facility] ORS;  Service: Cardiovascular;  Laterality: N/A;   CARDIOVERSION N/A 08/08/2018   Procedure: CARDIOVERSION;  Surgeon: [name], MD;  Location: [facility] ORS;  Service: Cardiovascular;  Laterality: N/A;   hearnia repair     X 3- total of two surgeries   HERNIA REPAIR     LEG SURGERY      Social History:  reports that he has never smoked. He has never used smokeless tobacco. He reports previous alcohol use. He reports that he does not use drugs.  Family History:  Family History  Problem Relation Age of Onset   Heart failure Mother    Lung cancer Mother    Lung cancer  Father    Heart attack Maternal Grandmother    Heart attack Maternal Grandfather      Prior to Admission medications   Medication Sig Start Date End Date Taking? Authorizing Provider  acetaminophen (TYLENOL) 325 MG tablet Take 2 tablets (650 mg total) by mouth every 8 (eight) hours as needed for mild pain or moderate pain (or Fever >/= 101). 06/29/20   Lurene Shadow, MD  albuterol (VENTOLIN HFA) 108 (90 Base) MCG/ACT inhaler Inhale 2 puffs into the lungs every 4 (four) hours as needed for wheezing or shortness of breath. 04/28/20   Tresa Moore, MD  allopurinol (ZYLOPRIM) 100 MG tablet Take 1 tablet (100 mg total) by mouth daily. 06/30/20   Lurene Shadow, MD  apixaban (ELIQUIS) 5 MG TABS tablet Take 1 tablet (5 mg total) by mouth 2 (two) times daily. 04/28/20   Tresa Moore, MD  atorvastatin (LIPITOR) 40 MG tablet TAKE 1 TABLET BY MOUTH ONCE DAILY Patient taking differently: Take 40 mg by mouth daily. 08/15/19   Flinchum, [name], FNP  colchicine 0.6 MG tablet Take 1 tablet (0.6 mg total) by mouth daily. 06/30/20   Lurene Shadow, MD  DULoxetine (CYMBALTA) 20 MG capsule Take 1 capsule (20 mg total) by mouth daily. 10/25/19   Karamalegos, Netta Neat, DO  gabapentin (NEURONTIN) 300 MG capsule Take 300 mg by mouth 3 (three) times daily.    [provider]  levothyroxine (SYNTHROID) 75 MCG tablet Take 1 tablet (75 mcg total) by mouth daily before breakfast. 01/13/20   Enedina Finner, MD  lisinopril (ZESTRIL) 2.5 MG tablet Take by mouth. 03/06/19   [provider]  meclizine (ANTIVERT) 25 MG tablet Take 0.5-1 tablets (12.5-25 mg total) by mouth 3 (three) times daily as needed for dizziness. 02/26/20   Karamalegos, Netta Neat, DO  methocarbamol (ROBAXIN) 750 MG tablet Take 750 mg by mouth 3 (three) times daily. 06/29/20   [provider]  metoprolol succinate (TOPROL-XL) 100 MG 24 hr tablet Take 100 mg by mouth daily. 03/17/20   [provider]  metoprolol  tartrate (LOPRESSOR) 100 MG tablet Take 1 tablet (100 mg total) by mouth 2 (two) times daily. 06/29/20   Lurene Shadow, MD  montelukast (SINGULAIR) 10 MG tablet Take 1 tablet (10 mg total) by mouth at bedtime. 10/12/19   Karamalegos, Netta Neat, DO  Multiple Vitamin (MULTIVITAMIN WITH MINERALS) TABS tablet Take 1 tablet by mouth daily.    [provider]  naphazoline-glycerin (CLEAR EYES REDNESS) 0.012-0.2 % SOLN Place 1-2 drops into both eyes 4 (four) times daily as needed for eye irritation. 08/14/19   Alford Highland, MD  nitroGLYCERIN (NITROSTAT) 0.4 MG SL tablet Place 1 tablet under tongue every 5 minutes as needed for chest pain. (No more than 3 doses within 15 minutes) 03/08/19   Flinchum, [name], FNP  oxyCODONE-acetaminophen (PERCOCET) 7.5-325 MG tablet Take 1 tablet by mouth every 8 (eight) hours as needed for severe  pain. 06/29/20   Lurene Shadow, MD  potassium chloride SA (KLOR-CON) 20 MEQ tablet Take 1 tablet (20 mEq total) by mouth daily. 05/02/20   Danford, Earl Lites, MD  torsemide (DEMADEX) 20 MG tablet Take 4 tablets (80 mg total) by mouth 2 (two) times daily. Patient not taking: No sig reported 02/02/20   Esaw Grandchild A, DO  traZODone (DESYREL) 150 MG tablet Take 1 tablet (150 mg total) by mouth at bedtime. 10/25/19   [name], DO    Physical Exam: Vitals:   08/30/20 0815 08/30/20 0818 08/30/20 0820 08/30/20 0830  BP:    (!) 161/109  Pulse:  (!) 102  69  Resp:  (!) 24  17  Temp:   98.4 F (36.9 C)   TempSrc:   Oral   SpO2:  96%  100%  Weight: (!) 158.8 kg     Height: 5\' 8"  (1.727 m)      General: Not in acute distress HEENT:       Eyes: PERRL, EOMI, no scleral icterus.       ENT: No discharge from the ears and nose, no pharynx injection, no tonsillar enlargement.        Neck: No JVD, no bruit, no mass felt. Heme: No neck lymph node enlargement. Cardiac: S1/S2, RRR, No murmurs, No gallops or rubs. Respiratory: No rales, wheezing, rhonchi  or rubs. GI: Soft, nondistended, has tenderness in lower abdomen, no rebound pain, no organomegaly, BS present. GU: No hematuria Ext: 3+ pitting leg edema bilaterally. 1+DP/PT pulse bilaterally. Musculoskeletal: No joint deformities, No joint redness or warmth, no limitation of ROM in spin. Skin: No rashes.  Neuro: Alert, oriented X3, cranial nerves II-XII grossly intact, moves all extremities normally.  Psych: Patient is not psychotic, no suicidal or hemocidal ideation.  Labs on Admission: I have personally reviewed following labs and imaging studies  CBC: Recent Labs  Lab 08/30/20 0819  WBC 8.5  HGB 11.3*  HCT 34.0*  MCV 90.4  PLT 194   Basic Metabolic Panel: Recent Labs  Lab 08/30/20 0819  NA 136  K 3.4*  CL 94*  CO2 29  GLUCOSE 159*  BUN 14  CREATININE 0.55*  CALCIUM 8.4*   GFR: Estimated Creatinine Clearance: 147.1 mL/min (A) (by C-G formula based on SCr of 0.55 mg/dL (L)). Liver Function Tests: No results for input(s): AST, ALT, ALKPHOS, BILITOT, PROT, ALBUMIN in the last 168 hours. No results for input(s): LIPASE, AMYLASE in the last 168 hours. No results for input(s): AMMONIA in the last 168 hours. Coagulation Profile: No results for input(s): INR, PROTIME in the last 168 hours. Cardiac Enzymes: No results for input(s): CKTOTAL, CKMB, CKMBINDEX, TROPONINI in the last 168 hours. BNP (last 3 results) No results for input(s): PROBNP in the last 8760 hours. HbA1C: No results for input(s): HGBA1C in the last 72 hours. CBG: No results for input(s): GLUCAP in the last 168 hours. Lipid Profile: No results for input(s): CHOL, HDL, LDLCALC, TRIG, CHOLHDL, LDLDIRECT in the last 72 hours. Thyroid Function Tests: No results for input(s): TSH, T4TOTAL, FREET4, T3FREE, THYROIDAB in the last 72 hours. Anemia Panel: No results for input(s): VITAMINB12, FOLATE, FERRITIN, TIBC, IRON, RETICCTPCT in the last 72 hours. Urine analysis:    Component Value Date/Time    COLORURINE YELLOW (A) 07/02/2020 1358   APPEARANCEUR HAZY (A) 07/02/2020 1358   APPEARANCEUR Hazy (A) 12/25/2018 1442   LABSPEC 1.009 07/02/2020 1358   PHURINE 6.0 07/02/2020 1358   GLUCOSEU NEGATIVE 07/02/2020 1358  HGBUR SMALL (A) 07/02/2020 1358   BILIRUBINUR NEGATIVE 07/02/2020 1358   BILIRUBINUR Negative 12/25/2018 1442   KETONESUR NEGATIVE 07/02/2020 1358   PROTEINUR NEGATIVE 07/02/2020 1358   NITRITE NEGATIVE 07/02/2020 1358   LEUKOCYTESUR LARGE (A) 07/02/2020 1358   Sepsis Labs: @LABRCNTIP (procalcitonin:4,lacticidven:4) )No results found for this or any previous visit (from the past 240 hour(s)).   Radiological Exams on Admission: DG Chest Portable 1 View  Result Date: 08/30/2020 CLINICAL DATA:  Per Triage: Pt arrives via EMS from home after having increased SHOB- per EMS pt was 95% on his chronic 3L [place]- pt was also vomiting on the ems stretcher Patient alert and oriented. Patient able to move with help. Patient is naus.*comment was truncated*SHOB EXAM: PORTABLE CHEST 1 VIEW COMPARISON:  08/30/2018 FINDINGS: Stable cardiomegaly. There is increase in vascular congestion pattern. Probable mild edema superimposed on the vascular congestion. No pneumothorax. No focal consolidation. IMPRESSION: Cardiomegaly and increased pulmonary edema pattern. Electronically Signed   By: Genevive BiStewart  Edmunds M.D.   On: 08/30/2020 09:19     EKG: I have personally reviewed.  Not done in ED, will get one.   Assessment/Plan Principal Problem:   Acute on chronic systolic congestive heart failure (HCC) Active Problems:   Obstructive sleep apnea   HTN (hypertension)   Personal history of DVT (deep vein thrombosis)   Chest pain   Major depressive disorder, recurrent episode, in partial remission (HCC)   PE (pulmonary thromboembolism) (HCC)   HLD (hyperlipidemia)   CAD (coronary artery disease)   Hypothyroidism   Depression   Elevated troponin   Gout   Alcohol abuse   COPD (chronic obstructive  pulmonary disease) (HCC)   Atrial fibrillation, chronic (HCC)   Hypokalemia   Abdominal pain  Acute on chronic systolic congestive heart failure (HCC): 2D echo on 04/07/20 showed EF of 30-35%. -admit to progressive bed as inpt -Lasix 60 mg bid by IV -2D echo  -Daily weights -strict I/O's -Low salt diet -Fluid restriction   COPD -Bronchodilators -As needed Mucinex  Abdominal pain: Patient has nausea vomiting and lower abdominal pain.  Etiology is not clear. -As needed Zofran and morphine -Check lipase -->24 -Follow-up CT scan of abdomen/pelvis --> negative for acute issues   Elevated troponin and chest pain and history of CAD: Troponin 48.  Likely due to demand ischemia. -Trend troponin -As needed nitroglycerin -Continue Lipitor -Check UDS, FLP (pt had A1c 6.6 on 04/15/20)   OSA - on CPAP   Persistent atrial fibrillation (HCC): HR 100s -Metoprolol -Coumadin   HTN (hypertension) -IV hydralazine as needed -Metoprolol, lisinopril   Personal history of DVT (deep vein thrombosis) and PE:  -Coumadin per pharm   HLD (hyperlipidemia) -Lipitor   Hypothyroidism -Synthroid   Depression -Cymbalta   Alcohol abuse -CIWA protocol   Gout: -Allopurinol and colchicine  Hypokalemia: K 3.4 -Repleted potassium -Check magnesium level   DVT ppx: on Couamdin Code Status: Full code ( discussed with pt, he wants to be full code today) Family Communication: not done, no family member is at bed side.   Disposition Plan:  Anticipate discharge back to previous environment Consults called:  none Admission status and Level of care: Progressive Cardiac:   as inpt       Status is: Inpatient  Remains inpatient appropriate because:Inpatient level of care appropriate due to severity of illness  Dispo: The patient is from: Home              Anticipated d/c is to: Home  Patient currently is not medically stable to d/c.   Difficult to place patient  No          Date of Service 08/30/2020    Lorretta Harp [facility]   If 7PM-7AM, please contact night-coverage [email or web address] 08/30/2020, 10:20 AM
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
## 2020-08-30 NOTE — Progress Notes (Signed)
*  PRELIMINARY RESULTS* Echocardiogram 2D Echocardiogram has been performed. Definity IV Contrast used on this study.  [name] [name] 08/30/2020, 3:59 PM

## 2020-08-31 ENCOUNTER — Inpatient Hospital Stay: Payer: Medicaid Other

## 2020-08-31 LAB — BASIC METABOLIC PANEL
Anion gap: 10 (ref 5–15)
BUN: 13 mg/dL (ref 6–20)
CO2: 35 mmol/L — ABNORMAL HIGH (ref 22–32)
Calcium: 8.6 mg/dL — ABNORMAL LOW (ref 8.9–10.3)
Chloride: 92 mmol/L — ABNORMAL LOW (ref 98–111)
Creatinine, Ser: 0.65 mg/dL (ref 0.61–1.24)
GFR, Estimated: >60 mL/min (ref >60)
Glucose, Bld: 116 mg/dL — ABNORMAL HIGH (ref 70–99)
Potassium: 4 mmol/L (ref 3.5–5.1)
Sodium: 137 mmol/L (ref 135–145)

## 2020-08-31 LAB — LIPID PANEL
Cholesterol: 168 mg/dL (ref 0–200)
HDL: 75 mg/dL (ref >40)
LDL Cholesterol: 76 mg/dL (ref 0–99)
Total CHOL/HDL Ratio: 2.2 RATIO
Triglycerides: 83 mg/dL (ref <150)
VLDL: 17 mg/dL (ref 0–40)

## 2020-08-31 LAB — GLUCOSE, CAPILLARY
Glucose-Capillary: 107 mg/dL — ABNORMAL HIGH (ref 70–99)
Glucose-Capillary: 241 mg/dL — ABNORMAL HIGH (ref 70–99)
Glucose-Capillary: 177 mg/dL — ABNORMAL HIGH (ref 70–99)
Glucose-Capillary: 176 mg/dL — ABNORMAL HIGH (ref 70–99)

## 2020-08-31 LAB — CBC
HCT: 34.3 % — ABNORMAL LOW (ref 39.0–52.0)
Hemoglobin: 11 g/dL — ABNORMAL LOW (ref 13.0–17.0)
MCH: 30.4 pg (ref 26.0–34.0)
MCHC: 32.1 g/dL (ref 30.0–36.0)
MCV: 94.8 fL (ref 80.0–100.0)
Platelets: 153 10*3/uL (ref 150–400)
RBC: 3.62 MIL/uL — ABNORMAL LOW (ref 4.22–5.81)
RDW: 15.9 % — ABNORMAL HIGH (ref 11.5–15.5)
WBC: 10.3 10*3/uL (ref 4.0–10.5)
nRBC: 0 % (ref 0.0–0.2)

## 2020-08-31 LAB — PROTIME-INR
INR: 1.4 — ABNORMAL HIGH (ref 0.8–1.2)
Prothrombin Time: 16.6 seconds — ABNORMAL HIGH (ref 11.4–15.2)

## 2020-08-31 LAB — SARS CORONAVIRUS 2 (TAT 6-24 HRS): SARS Coronavirus 2: POSITIVE — AB

## 2020-08-31 LAB — ECHOCARDIOGRAM COMPLETE
AV Peak grad: 5.6 mmHg
Ao pk vel: 1.18 m/s
Area-P 1/2: 3.81 cm2
Height: 68 in
Weight: 5600 oz

## 2020-08-31 LAB — MAGNESIUM: Magnesium: 1.6 mg/dL — ABNORMAL LOW (ref 1.7–2.4)

## 2020-08-31 MED ORDER — IOHEXOL 350 MG/ML SOLN
100.0000 mL | Freq: Once | INTRAVENOUS | Status: AC | PRN
  Administered 2020-08-31: 100 mL via INTRAVENOUS

## 2020-08-31 MED ORDER — HALOPERIDOL LACTATE 5 MG/ML IJ SOLN: 2.0000 mg | Freq: Once | INTRAMUSCULAR | Status: DC

## 2020-08-31 MED ORDER — ASCORBIC ACID 500 MG PO TABS
500.0000 mg | ORAL_TABLET | Freq: Every day | ORAL | Status: DC
  Administered 2020-08-31 – 2020-09-05 (×5): 500 mg via ORAL
  Filled 2020-08-31 (×5): qty 1

## 2020-08-31 MED ORDER — LORAZEPAM 2 MG/ML IJ SOLN: 2.0000 mg | Freq: Once | INTRAMUSCULAR | Status: DC

## 2020-08-31 MED ORDER — SODIUM CHLORIDE 0.9 % IV SOLN
100.0000 mg | Freq: Every day | INTRAVENOUS | Status: AC
  Administered 2020-09-01 – 2020-09-04 (×4): 100 mg via INTRAVENOUS
  Filled 2020-08-31 (×2): qty 100
  Filled 2020-08-31 (×3): qty 20

## 2020-08-31 MED ORDER — PREDNISONE 10 MG PO TABS: 50.0000 mg | ORAL_TABLET | Freq: Every day | ORAL | Status: DC

## 2020-08-31 MED ORDER — MAGNESIUM CHLORIDE 64 MG PO TBEC
1.0000 | DELAYED_RELEASE_TABLET | Freq: Every day | ORAL | Status: DC
  Administered 2020-08-31: 64 mg via ORAL
  Filled 2020-08-31 (×2): qty 1

## 2020-08-31 MED ORDER — METHYLPREDNISOLONE SODIUM SUCC 125 MG IJ SOLR
0.5000 mg/kg | Freq: Two times a day (BID) | INTRAMUSCULAR | Status: DC
  Administered 2020-08-31 – 2020-09-02 (×5): 91.25 mg via INTRAVENOUS
  Filled 2020-08-31 (×6): qty 2

## 2020-08-31 MED ORDER — MAGNESIUM SULFATE 2 GM/50ML IV SOLN: 2.0000 g | Freq: Once | INTRAVENOUS | Status: DC

## 2020-08-31 MED ORDER — ALBUTEROL SULFATE HFA 108 (90 BASE) MCG/ACT IN AERS
1.0000 | INHALATION_SPRAY | RESPIRATORY_TRACT | Status: DC | PRN
  Filled 2020-08-31: qty 6.7

## 2020-08-31 MED ORDER — GUAIFENESIN-DM 100-10 MG/5ML PO SYRP: 10.0000 mL | ORAL_SOLUTION | ORAL | Status: DC | PRN

## 2020-08-31 MED ORDER — WARFARIN SODIUM 7.5 MG PO TABS
7.5000 mg | ORAL_TABLET | Freq: Once | ORAL | Status: AC
  Administered 2020-08-31: 7.5 mg via ORAL
  Filled 2020-08-31: qty 1

## 2020-08-31 MED ORDER — HALOPERIDOL LACTATE 5 MG/ML IJ SOLN
5.0000 mg | Freq: Once | INTRAMUSCULAR | Status: AC
  Administered 2020-09-01: 5 mg via INTRAMUSCULAR
  Filled 2020-08-31: qty 1

## 2020-08-31 MED ORDER — ZINC SULFATE 220 (50 ZN) MG PO CAPS
220.0000 mg | ORAL_CAPSULE | Freq: Every day | ORAL | Status: DC
  Administered 2020-08-31 – 2020-09-05 (×5): 220 mg via ORAL
  Filled 2020-08-31 (×5): qty 1

## 2020-08-31 MED ORDER — DM-GUAIFENESIN ER 30-600 MG PO TB12
1.0000 | ORAL_TABLET | Freq: Two times a day (BID) | ORAL | Status: DC
  Administered 2020-08-31: 1 via ORAL
  Filled 2020-08-31 (×2): qty 1

## 2020-08-31 MED ORDER — SODIUM CHLORIDE 0.9 % IV SOLN
200.0000 mg | Freq: Once | INTRAVENOUS | Status: AC
  Administered 2020-08-31: 200 mg via INTRAVENOUS
  Filled 2020-08-31: qty 40

## 2020-08-31 NOTE — Progress Notes (Signed)
ANTICOAGULATION CONSULT NOTE  Pharmacy Consult for Warfarin Indication: atrial fibrillation and pulmonary embolus  No Known Allergies  Patient Measurements: Height: 5\' 8"  (172.7 cm) Weight: (!) 182.1 kg (401 lb 7.3 oz) IBW/kg (Calculated) : 68.4  Vital Signs: Temp: 98.4 F (36.9 C) (07/17 0733) Temp Source: Oral (07/17 0733) BP: 136/78 (07/17 0733) Pulse Rate: 108 (07/17 0733)  Labs: Recent Labs    08/30/20 0819 08/30/20 1203 08/30/20 1415 08/30/20 1604 08/31/20 0520  HGB 11.3*  --   --   --  11.0*  HCT 34.0*  --   --   --  34.3*  PLT 194  --   --   --  153  LABPROT 14.3  --   --   --  16.6*  INR 1.1  --   --   --  1.4*  CREATININE 0.55*  --   --   --  0.65  TROPONINIHS 48* 40* 38* 39*  --      Estimated Creatinine Clearance: 160.2 mL/min (by C-G formula based on SCr of 0.65 mg/dL).   Medical History: Past Medical History:  Diagnosis Date   Allergy    Anxiety    Arthritis    Asthma    Brain damage    Chronic pain of both knees 07/13/2018   Clotting disorder (HCC)    COPD (chronic obstructive pulmonary disease) (HCC)    Depression    GERD (gastroesophageal reflux disease)    HFrEF (heart failure with reduced ejection fraction) (HCC)    a. 03/2018 Echo: EF 25-30%, diff HK. Mod LAE.   History of DVT (deep vein thrombosis)    History of pulmonary embolism    a. Chronic coumadin.   Hypertension    MI (myocardial infarction) (HCC)    Morbid obesity (HCC)    Neck pain 07/13/2018   NICM (nonischemic cardiomyopathy) (HCC)    a. s/p Cath x 3 - reportedly nl cors. Last cath 2019 in [place]; b. a. 03/2018 Echo: EF 25-30%, diff HK.   Persistent atrial fibrillation (HCC)    a. 03/2018 s/p DCCV; b. CHA2DS2VASc = 1-->Xarelto (later changed to warfarin); c. 05/2018 recurrent afib-->Amio initiated.   Sleep apnea    Sleep apnea     Medications:  Pharmacy technician spoke with pt who confirmed pt takes warfarin 5 mg daily and his last dose was 7/15.   Assessment: 60 y.o.  male with medical history significant of dCHF, hypertension, hyperlipidemia, diet-controlled diabetes, COPD, hypothyroidism, gout, depression, anxiety, OSA on CPAP, atrial fibrillation, PE/DVT on Coumadin, CAD, alcohol abuse, obesity, who presents with shortness of breath. Pt has a history of being subtherapeutic on warfarin and had been switched to Eliquis due to non-compliance with INR checks, however, now pt is back on warfarin. Pharmacy has been consulted for dosing/monitoring of warfarin.  Date INR Dose/Comment 7/16 1.1 7.5 mg 7/17 1.4   Goal of Therapy:  INR 2-3 Monitor platelets by anticoagulation protocol: Yes   Plan:  INR=1.4 subtherapeutic but did increase, will order warfarin 7.5 mg x 1 tonight Pt w/ CHF/lasix, allopurinol Monitor CBC and INR daily    8/17, PharmD Clinical Pharmacist 08/31/2020,9:10 AM

## 2020-08-31 NOTE — Progress Notes (Signed)
Patient was found face down next to bed about 1930.  Patient had been confused since receiving CT this afternoon.  Patient was siiting on side of bed before falling.  No injuries found.  Vital signs stable.  Dr. Toniann Fail notified.  Patient returned to bed w/ assistance of 3 nurses and a lift.  Incontinence care provided and linens changed.  Patient is completely confused and says he was going for a walk and heard children yelling.  He refuses to believe he is in hospital.  About 2300, patient's bed alarming and nurse responded to bedside. Patient had removed all clothes, tele and oxygen and was trying to walk around bed to leave room and hospital.  Patient returned to bed with assistance of 4 nurses.  Patient had CIWA score of 31.  Patient given prn IM 4 mg ativan per Dr. Toniann Fail as he had pulled out his PIV. He refused all care, including only allowing limited hygiene care, refused all meds.  Removed oxygen and tele.  Dr. Toniann Fail made aware.  Patient continually attempting to leave bed.
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
## 2020-08-31 NOTE — Progress Notes (Signed)
[facility] Progress Note  Patient: [name] [name]    TIR:[id number]  DOA: 08/30/2020     Date of Service: the patient was seen and examined on 08/31/2020  Brief hospital course: Past medical history of HFpEF, HTN, HLD, type II DM, COPD, hypothyroidism Depression, anxiety, gout, OSA on CPAP, chronic A. 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  Presented with complaints of shortness of breath. Found to have acute on chronic hypoxic respiratory failure secondary to acute on chronic diastolic CHF.  Currently plan is continue IV Lasix.  Subjective: Continues to have shortness of breath.  Reports left-sided chest pain.  No nausea no vomiting.  No fever no chills.  Also has some leg pain.  No diarrhea.  No bleeding anywhere.  Mentions that he is compliant with medication.  Assessment and Plan: 1.  Acute on chronic systolic CHF Echocardiogram EF 30 to 35%. Currently receiving IV Lasix 60 mg twice a day. Monitor on telemetry in progressive care unit. Echocardiogram still pending. Continue restricted diet.  2.  COVID-19 infection Likely incidental. CT PE protocol negative for any evidence of COVID related infiltrate. Given patient's presentation we will still treat with remdesivir and Solu-Medrol due to his very high risk status. Monitor. Currently not a candidate for immunomodulator.  3.  History of COPD OSA On CPAP chronically. Continue. Continue bronchodilators as well.  4.  Abdominal pain CT abdomen negative for any acute abnormality. Monitor.  5.  Elevated troponin CAD Demand ischemia. Continue with Lipitor.  6.  Chronic A. fib. History of DVT PE CT PE protocol as well as lower extremity Doppler negative for DVT PE. INR subtherapeutic. Currently rate controlled. Continue Lopressor.  7.  Depression. Mood disorder. Holding antipsychotic medication.  8.  Continue BuSpar Continue CIWA protocol.  9.  Allopurinol and colchicine on hold for  now.  Scheduled Meds:  allopurinol  100 mg Oral Daily   vitamin C  500 mg Oral Daily   atorvastatin  40 mg Oral Daily   dextromethorphan-guaiFENesin  1 tablet Oral BID   DULoxetine  30 mg Oral Daily   folic acid  1 mg Oral Daily   furosemide  60 mg Intravenous Q12H   levothyroxine  75 mcg Oral QAC breakfast   LORazepam  0-4 mg Intravenous Q6H   Followed by   Melene Muller ON 09/01/2020] LORazepam  0-4 mg Intravenous Q12H   magnesium chloride  1 tablet Oral Daily   methylPREDNISolone (SOLU-MEDROL) injection  0.5 mg/kg Intravenous Q12H   Followed by   Melene Muller ON 09/03/2020] predniSONE  50 mg Oral Daily   metoprolol tartrate  100 mg Oral BID   montelukast  10 mg Oral QHS   multivitamin with minerals  1 tablet Oral Daily   pantoprazole  40 mg Oral Q1200   thiamine  100 mg Oral Daily   Or   thiamine  100 mg Intravenous Daily   Warfarin - Pharmacist Dosing Inpatient   Does not apply q1600   zinc sulfate  220 mg Oral Daily   Continuous Infusions:  [START ON 09/01/2020] remdesivir 100 mg in NS 100 mL     PRN Meds: acetaminophen, albuterol, diclofenac Sodium, hydrALAZINE, LORazepam **OR** LORazepam, morphine injection, naphazoline-glycerin, nitroGLYCERIN, ondansetron (ZOFRAN) IV, oxyCODONE-acetaminophen, SUMAtriptan  Body mass index is 61.04 kg/m.        DVT Prophylaxis: Subcutaneous Lovenox      Advance goals of care discussion: Pt is Full code.  Family Communication: no family was present at bedside, at the time of  interview.    Data Reviewed: I have personally reviewed and interpreted daily labs, tele strips, imaging. CT PE protocol negative for pulmonary embolism as well as negative for any significant infiltrate. Serum creatinine stable.  Troponin mildly elevated at 99. WBC stable.  Hemoglobin 11.  Physical Exam:  General: Appear in mild distress, no Rash; Oral Mucosa Clear, moist. no Abnormal Neck Mass Or lumps, Conjunctiva normal  Cardiovascular: S1 and S2 Present, no  Murmur, Respiratory: increased respiratory effort, Bilateral Air entry present and bilateral  Crackles, no wheezes Abdomen: Bowel Sound present, Soft and no tenderness Extremities: trace Pedal edema Neurology: alert and oriented to time, place, and person affect appropriate. no new focal deficit Gait not checked due to patient safety concerns   Vitals:   08/31/20 0438 08/31/20 0733 08/31/20 1222 08/31/20 1625  BP: 108/61 136/78 129/81 127/80  Pulse: 91 (!) 108 (!) 110 74  Resp: 19 18 19 18   Temp: 98.2 F (36.8 C) 98.4 F (36.9 C) 97.7 F (36.5 C) 97.8 F (36.6 C)  TempSrc:  Oral    SpO2: 100% 100% 91% 94%  Weight: (!) 182.1 kg     Height:        Disposition:  Status is: Inpatient  Remains inpatient appropriate because:IV treatments appropriate due to intensity of illness or inability to take PO and Inpatient level of care appropriate due to severity of illness  Dispo: The patient is from: Home              Anticipated d/c is to: SNF              Patient currently is not medically stable to d/c.   Difficult to place patient No  Time spent: 35 minutes. I reviewed all nursing notes, pharmacy notes, vitals, pertinent old records. I have discussed plan of care as described above with RN.  Author: , MD [facility] 08/31/2020 6:30 PM  To reach On-call, see care teams to locate the attending and reach out via [email or web address]. Between 7PM-7AM, please contact night-coverage If you still have difficulty reaching the attending provider, please page the [facility] (Director on Call) for [facility] on amion for assistance.

## 2020-09-01 LAB — MRSA NEXT GEN BY PCR, NASAL: MRSA by PCR Next Gen: DETECTED — AB

## 2020-09-01 LAB — CBC WITH DIFFERENTIAL/PLATELET
Abs Immature Granulocytes: 0.02 10*3/uL (ref 0.00–0.07)
Basophils Absolute: 0 10*3/uL (ref 0.0–0.1)
Basophils Relative: 0 %
Eosinophils Absolute: 0 10*3/uL (ref 0.0–0.5)
Eosinophils Relative: 0 %
HCT: 33.1 % — ABNORMAL LOW (ref 39.0–52.0)
Hemoglobin: 10.7 g/dL — ABNORMAL LOW (ref 13.0–17.0)
Immature Granulocytes: 0 %
Lymphocytes Relative: 8 %
Lymphs Abs: 0.5 10*3/uL — ABNORMAL LOW (ref 0.7–4.0)
MCH: 31.2 pg (ref 26.0–34.0)
MCHC: 32.3 g/dL (ref 30.0–36.0)
MCV: 96.5 fL (ref 80.0–100.0)
Monocytes Absolute: 0.3 10*3/uL (ref 0.1–1.0)
Monocytes Relative: 6 %
Neutro Abs: 5 10*3/uL (ref 1.7–7.7)
Neutrophils Relative %: 86 %
Platelets: 124 10*3/uL — ABNORMAL LOW (ref 150–400)
RBC: 3.43 MIL/uL — ABNORMAL LOW (ref 4.22–5.81)
RDW: 15.9 % — ABNORMAL HIGH (ref 11.5–15.5)
WBC: 5.8 10*3/uL (ref 4.0–10.5)
nRBC: 0 % (ref 0.0–0.2)

## 2020-09-01 LAB — COMPREHENSIVE METABOLIC PANEL
ALT: 40 U/L (ref 0–44)
AST: 57 U/L — ABNORMAL HIGH (ref 15–41)
Albumin: 3 g/dL — ABNORMAL LOW (ref 3.5–5.0)
Alkaline Phosphatase: 80 U/L (ref 38–126)
Anion gap: 9 (ref 5–15)
BUN: 16 mg/dL (ref 6–20)
CO2: 35 mmol/L — ABNORMAL HIGH (ref 22–32)
Calcium: 8.6 mg/dL — ABNORMAL LOW (ref 8.9–10.3)
Chloride: 92 mmol/L — ABNORMAL LOW (ref 98–111)
Creatinine, Ser: 0.83 mg/dL (ref 0.61–1.24)
GFR, Estimated: >60 mL/min (ref >60)
Glucose, Bld: 118 mg/dL — ABNORMAL HIGH (ref 70–99)
Potassium: 4.1 mmol/L (ref 3.5–5.1)
Sodium: 136 mmol/L (ref 135–145)
Total Bilirubin: 0.5 mg/dL (ref 0.3–1.2)
Total Protein: 6.7 g/dL (ref 6.5–8.1)

## 2020-09-01 LAB — GLUCOSE, CAPILLARY
Glucose-Capillary: 118 mg/dL — ABNORMAL HIGH (ref 70–99)
Glucose-Capillary: 144 mg/dL — ABNORMAL HIGH (ref 70–99)

## 2020-09-01 LAB — FERRITIN: Ferritin: 117 ng/mL (ref 24–336)

## 2020-09-01 LAB — D-DIMER, QUANTITATIVE: D-Dimer, Quant: 1.83 ug/mL-FEU — ABNORMAL HIGH (ref 0.00–0.50)

## 2020-09-01 LAB — AMMONIA: Ammonia: <9 umol/L — ABNORMAL LOW (ref 9–35)

## 2020-09-01 LAB — PROTIME-INR
INR: 1.8 — ABNORMAL HIGH (ref 0.8–1.2)
Prothrombin Time: 21 seconds — ABNORMAL HIGH (ref 11.4–15.2)

## 2020-09-01 LAB — TSH: TSH: 7.681 u[IU]/mL — ABNORMAL HIGH (ref 0.350–4.500)

## 2020-09-01 LAB — T4, FREE: Free T4: 0.72 ng/dL (ref 0.61–1.12)

## 2020-09-01 LAB — C-REACTIVE PROTEIN: CRP: 10.7 mg/dL — ABNORMAL HIGH (ref <1.0)

## 2020-09-01 LAB — VITAMIN B12: Vitamin B-12: 476 pg/mL (ref 180–914)

## 2020-09-01 LAB — MAGNESIUM: Magnesium: 1.6 mg/dL — ABNORMAL LOW (ref 1.7–2.4)

## 2020-09-01 MED ORDER — LORAZEPAM 2 MG/ML IJ SOLN
0.0000 mg | INTRAMUSCULAR | Status: DC
  Filled 2020-09-01: qty 2
  Filled 2020-09-01: qty 1

## 2020-09-01 MED ORDER — MUPIROCIN 2 % EX OINT
1.0000 "application " | TOPICAL_OINTMENT | Freq: Two times a day (BID) | CUTANEOUS | Status: DC
  Administered 2020-09-01 – 2020-09-05 (×9): 1 via NASAL
  Filled 2020-09-01 (×2): qty 22

## 2020-09-01 MED ORDER — LORAZEPAM 1 MG PO TABS: 1.0000 mg | ORAL_TABLET | ORAL | Status: DC | PRN

## 2020-09-01 MED ORDER — DEXMEDETOMIDINE HCL IN NACL 400 MCG/100ML IV SOLN: 0.4000 ug/kg/h | INTRAVENOUS | Status: DC

## 2020-09-01 MED ORDER — LORAZEPAM 2 MG/ML IJ SOLN: 0.0000 mg | Freq: Three times a day (TID) | INTRAMUSCULAR | Status: DC

## 2020-09-01 MED ORDER — LORAZEPAM 2 MG/ML IJ SOLN: 0.0000 mg | Freq: Three times a day (TID) | INTRAMUSCULAR | Status: AC

## 2020-09-01 MED ORDER — THIAMINE HCL 100 MG PO TABS
100.0000 mg | ORAL_TABLET | Freq: Every day | ORAL | Status: DC
  Administered 2020-09-02 – 2020-09-05 (×4): 100 mg via ORAL
  Filled 2020-09-01 (×4): qty 1

## 2020-09-01 MED ORDER — CHLORHEXIDINE GLUCONATE CLOTH 2 % EX PADS
6.0000 | MEDICATED_PAD | Freq: Every day | CUTANEOUS | Status: DC
  Administered 2020-09-01 – 2020-09-05 (×6): 6 via TOPICAL

## 2020-09-01 MED ORDER — WARFARIN SODIUM 5 MG PO TABS
5.0000 mg | ORAL_TABLET | Freq: Once | ORAL | Status: AC
  Administered 2020-09-01: 5 mg via ORAL
  Filled 2020-09-01: qty 1

## 2020-09-01 MED ORDER — THIAMINE HCL 100 MG/ML IJ SOLN
100.0000 mg | Freq: Every day | INTRAMUSCULAR | Status: DC
  Administered 2020-09-01: 100 mg via INTRAVENOUS
  Filled 2020-09-01 (×2): qty 2

## 2020-09-01 MED ORDER — ADULT MULTIVITAMIN W/MINERALS CH
1.0000 | ORAL_TABLET | Freq: Every day | ORAL | Status: DC
  Administered 2020-09-02 – 2020-09-05 (×4): 1 via ORAL
  Filled 2020-09-01 (×4): qty 1

## 2020-09-01 MED ORDER — FOLIC ACID 1 MG PO TABS
1.0000 mg | ORAL_TABLET | Freq: Every day | ORAL | Status: DC
  Administered 2020-09-02 – 2020-09-05 (×4): 1 mg via ORAL
  Filled 2020-09-01 (×4): qty 1

## 2020-09-01 MED ORDER — LEVOTHYROXINE SODIUM 88 MCG PO TABS
88.0000 ug | ORAL_TABLET | Freq: Every day | ORAL | Status: DC
  Administered 2020-09-02 – 2020-09-03 (×2): 88 ug via ORAL
  Filled 2020-09-01 (×2): qty 1

## 2020-09-01 MED ORDER — DEXMEDETOMIDINE HCL IN NACL 200 MCG/50ML IV SOLN
0.4000 ug/kg/h | INTRAVENOUS | Status: DC
  Filled 2020-09-01 (×3): qty 50

## 2020-09-01 MED ORDER — SODIUM CHLORIDE 0.9 % IV SOLN
1.0000 g | INTRAVENOUS | Status: DC
  Administered 2020-09-01 – 2020-09-04 (×4): 1 g via INTRAVENOUS
  Filled 2020-09-01 (×4): qty 10
  Filled 2020-09-01: qty 1
  Filled 2020-09-01: qty 10

## 2020-09-01 MED ORDER — METOPROLOL TARTRATE 5 MG/5ML IV SOLN
5.0000 mg | INTRAVENOUS | Status: DC | PRN
  Administered 2020-09-01: 5 mg via INTRAVENOUS
  Filled 2020-09-01: qty 5

## 2020-09-01 MED ORDER — LORAZEPAM 2 MG/ML IJ SOLN: 1.0000 mg | INTRAMUSCULAR | Status: DC | PRN

## 2020-09-01 MED ORDER — HALOPERIDOL LACTATE 5 MG/ML IJ SOLN: 2.0000 mg | Freq: Four times a day (QID) | INTRAMUSCULAR | Status: DC | PRN

## 2020-09-01 MED ORDER — MAGNESIUM SULFATE 2 GM/50ML IV SOLN
2.0000 g | Freq: Once | INTRAVENOUS | Status: AC
  Administered 2020-09-01: 2 g via INTRAVENOUS
  Filled 2020-09-01: qty 50

## 2020-09-01 MED ORDER — LORAZEPAM 2 MG/ML IJ SOLN
0.0000 mg | INTRAMUSCULAR | Status: AC
  Filled 2020-09-01: qty 2

## 2020-09-01 MED ORDER — ZINC OXIDE 11.3 % EX CREA
TOPICAL_CREAM | CUTANEOUS | Status: DC | PRN
  Filled 2020-09-01: qty 56

## 2020-09-01 NOTE — Plan of Care (Signed)
Patient transferred from PCU with increased confusion, agitation, ETOH withdrawal and a possible precedex drip.

## 2020-09-01 NOTE — Progress Notes (Addendum)
Consulted to see patient by Dr. Toniann Fail with [facility] service. Patient has been confused and hallucinating while actively withdrawing from ETOH. Current CIWA medications have not been effective enough to keep him safe. Request for transfer to ICU for potential need for precedex drip.  Upon bedside assessment, patient was assisted over to ICU. Patient is confused and hallucinating but redirectable with stable vitals. Discussed case with Dr. Toniann Fail, recommendations: - increase CIWA protocol to SDU as Q 4 - will hold on precedex for right now - change level of care to SDU  PCCM will follow peripherally and if patient requires precedex drip can officially consult.    Cheryll Cockayne Rust-Chester, AGACNP-BC Acute Care Nurse Practitioner [facility]   [phone] / [phone] Please see Amion for pager details.

## 2020-09-01 NOTE — Progress Notes (Signed)
[facility] Progress Note  Patient: [name] [name]    GHW:[id number]  DOA: 08/30/2020     Date of Service: the patient was seen and examined on 09/01/2020  Brief hospital course: Past medical history of HFpEF, HTN, HLD, type II DM, COPD, hypothyroidism Depression, anxiety, gout, OSA on CPAP, chronic A. 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  Presented with complaints of shortness of breath. 7/16 admitted for acute on chronic systolic CHF plus COVID-19 infection 7/17-18 transferred to stepdown unit for high CIWA. Currently plan is continue to monitor for encephalopathy, treat RVR and treat CHF.  Subjective: Patient sleepy and drowsy after receiving Haldol.  No nausea or vomiting.  Later in the day was awake but no agitation.  Heart rate elevated.  Assessment and Plan: 1.  Acute on chronic biventricular systolic heart failure Echocardiogram in 2/22 EF 30 to 35% Echocardiogram 7/16 poor acoustic window-unable to calculate LV but RV systolic function is severely reduced. Started on IV Lasix. Will continue with Lopressor 100 twice daily. Consult cardiology/[facility] tomorrow.  2.  COVID-19 infection Likely incidental. CT PE protocol negative for any significant COVID-related infiltrate. CRP mildly elevated. Started on IV remdesivir and Solu-Medrol given high risk. Not a good candidate for immunomodulator.  3.  Left leg cellulitis Will treat with IV cefazolin.  4.  History of COPD Chronic hypoxic respiratory failure Severe OSA On CPAP chronically. Also on oxygen 4 LPM at home. Continue CPAP nightly. Continue bronchodilators. Does not appear to be having an exacerbation right now.  5.  Abdominal pain CT abdomen negative for any acute abnormality.  Will monitor.  6.  Elevated troponin History of CAD HLD Troponin elevation most likely secondary to early demand ischemia from CHF. Currently on Lipitor. Not on any antiplatelet medication.  7.  Chronic A. fib. History  of DVT PE. RVR on 7/18. Will treat with IV Lopressor. Avoid Cardizem given low EF history of heart failure. CT PE protocol as well as lower extremity Doppler negative for DVT or PE. INR still subtherapeutic pharmacy monitoring. Rate controlled with Lopressor.  8.  Depression and mood disorder. Acute encephalopathy metabolic Alcohol use disorder. Currently being monitored with CIWA protocol. Holding antipsychotic medication.  9.  History of gout. Holding allopurinol and colchicine for now. Currently receiving steroids.  10.  GERD. Continuing PPI.  11.  Hypothyroidism. Continue Synthroid. TSH elevated, free T4 low normal.  Will increase Synthroid dose..  12.  Thrombocytopenia. Acute in nature. Will monitor. Anticipating improvement with steroid therapy.  13 hypomagnesemia. Currently being replaced.   Scheduled Meds:  vitamin C  500 mg Oral Daily   Chlorhexidine Gluconate Cloth  6 each Topical Daily   folic acid  1 mg Oral Daily   furosemide  60 mg Intravenous Q12H   [START ON 09/02/2020] levothyroxine  88 mcg Oral QAC breakfast   LORazepam  0-4 mg Intravenous Q4H   Followed by   Melene Muller ON 09/03/2020] LORazepam  0-4 mg Intravenous Q8H   methylPREDNISolone (SOLU-MEDROL) injection  0.5 mg/kg Intravenous Q12H   Followed by   Melene Muller ON 09/03/2020] predniSONE  50 mg Oral Daily   metoprolol tartrate  100 mg Oral BID   montelukast  10 mg Oral QHS   multivitamin with minerals  1 tablet Oral Daily   mupirocin ointment  1 application Nasal BID   pantoprazole  40 mg Oral Q1200   thiamine  100 mg Oral Daily   Or   thiamine  100 mg Intravenous Daily   Warfarin -  Pharmacist Dosing Inpatient   Does not apply q1600   zinc sulfate  220 mg Oral Daily   Continuous Infusions:  remdesivir 100 mg in NS 100 mL 100 mg (09/01/20 1248)   PRN Meds: acetaminophen, albuterol, diclofenac Sodium, haloperidol lactate, LORazepam **OR** LORazepam, metoprolol tartrate, morphine injection,  naphazoline-glycerin, nitroGLYCERIN, ondansetron (ZOFRAN) IV, oxyCODONE-acetaminophen, SUMAtriptan, zinc oxide  Body mass index is 59.9 kg/m.        DVT Prophylaxis: Warfarin   Advance goals of care discussion: Pt is Full code.  Family Communication: no family was present at bedside, at the time of interview.   Data Reviewed: I have personally reviewed and interpreted daily labs, tele strips, imaging. Platelet 124.  Serum creatinine stable.  Magnesium level 1.6.  AST mildly elevated, ALT stable. Mild leukopenia. Hemoglobin stable.  Physical Exam:  General: Appear in mild distress, no Rash; Oral Mucosa Clear, moist. no Abnormal Neck Mass Or lumps, Conjunctiva normal  Cardiovascular: S1 and S2 Present, no Murmur, Respiratory: good respiratory effort, Bilateral Air entry present and faint Crackles, no wheezes Abdomen: Bowel Sound present, Soft and no tenderness Extremities: bilateral  Pedal edema Neurology: Drowsy and oriented to time and place affect appropriate. no new focal deficit Gait not checked due to patient safety concerns  Vitals:   09/01/20 1500 09/01/20 1600 09/01/20 1700 09/01/20 1800  BP: 118/69 126/86 (!) 112/94 114/82  Pulse: (!) 43 82 (!) 55 (!) 32  Resp: 15 (!) 24 (!) 24 17  Temp:      TempSrc:      SpO2: 96% 96% 96% 94%  Weight:      Height:        Disposition:  Status is: Inpatient  Remains inpatient appropriate because:Altered mental status and Inpatient level of care appropriate due to severity of illness  Dispo: The patient is from: Home              Anticipated d/c is to: SNF              Patient currently is not medically stable to d/c.   Difficult to place patient No        Time spent: 35 minutes. I reviewed all nursing notes, pharmacy notes, vitals, pertinent old records. I have discussed plan of care as described above with RN.  Author: Lynden Oxford, MD [facility] 09/01/2020 6:55 PM  To reach On-call, see care teams to  locate the attending and reach out via [email or web address]. Between 7PM-7AM, please contact night-coverage If you still have difficulty reaching the attending provider, please page the [facility] (Director on Call) for [facility] on amion for assistance.

## 2020-09-01 NOTE — Progress Notes (Signed)
Patient transferred from PCU for increased agitation, ETOH withdrawal and a possible precedex drip. Upon arrival to unit, patient was alert to self, place, year, and president but was also reporting that "someone had been chasing him" and was seeing images/people  on the ceiling. Shortly after arrival, patient became increasingly more confused and began to try and eat the tv remote and the pulse oximeter. When redirected patient continued to try and put objects in mouth and was becoming more agitated. Ativan given per CIWA orders and Haldol (one time dose) also given with good effect noted. Upon assessment it was also noted that the patient has MASD/IAD wounds to perineum, BL thigh and abdominal folds. These area were cleaned and then foam dressings were placed on BL posterior thighs, antifungal powder to abdominal  folds and a wound consult entered.

## 2020-09-01 NOTE — Progress Notes (Signed)
ANTICOAGULATION CONSULT NOTE  Pharmacy Consult for Warfarin Indication: atrial fibrillation and pulmonary embolus  No Known Allergies  Patient Measurements: Height: 5\' 8"  (172.7 cm) Weight: (!) 178.7 kg (393 lb 15.4 oz) IBW/kg (Calculated) : 68.4  Vital Signs: Temp: 97.4 F (36.3 C) (07/18 0600) Temp Source: Axillary (07/18 0600) BP: 124/79 (07/18 0700) Pulse Rate: 84 (07/18 0700)  Labs: Recent Labs    08/30/20 0819 08/30/20 1203 08/30/20 1415 08/30/20 1604 08/31/20 0520 09/01/20 0648  HGB 11.3*  --   --   --  11.0* 10.7*  HCT 34.0*  --   --   --  34.3* 33.1*  PLT 194  --   --   --  153 124*  LABPROT 14.3  --   --   --  16.6* 21.0*  INR 1.1  --   --   --  1.4* 1.8*  CREATININE 0.55*  --   --   --  0.65 0.83  TROPONINIHS 48* 40* 38* 39*  --   --      Estimated Creatinine Clearance: 152.5 mL/min (by C-G formula based on SCr of 0.83 mg/dL).   Medical History: Past Medical History:  Diagnosis Date   Allergy    Anxiety    Arthritis    Asthma    Brain damage    Chronic pain of both knees 07/13/2018   Clotting disorder (HCC)    COPD (chronic obstructive pulmonary disease) (HCC)    Depression    GERD (gastroesophageal reflux disease)    HFrEF (heart failure with reduced ejection fraction) (HCC)    a. 03/2018 Echo: EF 25-30%, diff HK. Mod LAE.   History of DVT (deep vein thrombosis)    History of pulmonary embolism    a. Chronic coumadin.   Hypertension    MI (myocardial infarction) (HCC)    Morbid obesity (HCC)    Neck pain 07/13/2018   NICM (nonischemic cardiomyopathy) (HCC)    a. s/p Cath x 3 - reportedly nl cors. Last cath 2019 in [place]; b. a. 03/2018 Echo: EF 25-30%, diff HK.   Persistent atrial fibrillation (HCC)    a. 03/2018 s/p DCCV; b. CHA2DS2VASc = 1-->Xarelto (later changed to warfarin); c. 05/2018 recurrent afib-->Amio initiated.   Sleep apnea    Sleep apnea     Medications:  Pharmacy technician spoke with pt who confirmed pt takes warfarin 5 mg  daily and his last dose was 7/15.   Assessment: 60 y.o. male with medical history significant of dCHF, hypertension, hyperlipidemia, diet-controlled diabetes, COPD, hypothyroidism, gout, depression, anxiety, OSA on CPAP, atrial fibrillation, PE/DVT on Coumadin, CAD, alcohol abuse, obesity, who presents with shortness of breath. Pt has a history of being subtherapeutic on warfarin and had been switched to Eliquis due to non-compliance with INR checks, however, now pt is back on warfarin. Pharmacy has been consulted for dosing/monitoring of warfarin.  Date INR Dose/Comment 7/16 1.1 7.5 mg 7/17 1.4 7.5 mg 7/18 1.8   Goal of Therapy:  INR 2-3 Monitor platelets by anticoagulation protocol: Yes   Plan:  INR=1.8, subtherapeutic, but trending up. Expect to trend up further given increased dose x 2. Will continue with patient's home dose of warfarin 5 mg tonight Pt w/ CHF/lasix, allopurinol Monitor INR daily    8/18, PharmD Clinical Pharmacist 09/01/2020,9:23 AM

## 2020-09-01 NOTE — Consult Note (Addendum)
WOC Nurse Consult Note: Patient receiving care in [facility].  To complete this consult I reviewed the photos in the record, and spoke to the night shift RN, Efraim Kaufmann, and the day shift RN, Albin Felling.  Efraim Kaufmann explains that the photos represent the areas on the patient's buttocks. Reason for Consult: MASD-IAD to buttocks. Melissa tells me the patient explained to her when he was transferred to the ICU, that he sits in urine and stool for extended periods of time when at the SNF.  Also, there is MASD-ITD to the abdominal fold. She also explained that he had BLE unna boots that had fallen to his ankles, so they were removed.  Melissa reports there are no wounds on the feet or legs, only redness. Wound type: MASD-IAD, MASD-ITD Pressure Injury POA: Yes/No/NA Measurement: Wound bed: as represented in the photos from this morning Drainage (amount, consistency, odor)  Periwound: Dressing procedure/placement/frequency: I have entered orders for InterDry to the abdominal fold; Balmex zinc cream to buttocks/scrotum/thigh areas; and, to  wash legs with soap and water. Pat dry. Apply Sween Moisturizing Ointment. Beginning behind the toes and going to just below the knees, spiral wrap kerlex, then 4 inch ace wrap. Perform daily.  Patient is on isolation for COVID. Monitor the wound area(s) for worsening of condition such as: Signs/symptoms of infection,  Increase in size,  Development of or worsening of odor, Development of pain, or increased pain at the affected locations.  Notify the medical team if any of these develop.  Thank you for the consult.  Discussed plan of care with the bedside nurse.  WOC nurse will not follow at this time.  Please re-consult the WOC team if needed.  Helmut Muster, RN, MSN, CWOCN, CNS-BC, pager [phone]

## 2020-09-02 DIAGNOSIS — I509 Heart failure, unspecified: Secondary | ICD-10-CM | POA: Diagnosis not present

## 2020-09-02 LAB — CBC WITH DIFFERENTIAL/PLATELET
Abs Immature Granulocytes: 0.02 10*3/uL (ref 0.00–0.07)
Basophils Absolute: 0 10*3/uL (ref 0.0–0.1)
Basophils Relative: 0 %
Eosinophils Absolute: 0 10*3/uL (ref 0.0–0.5)
Eosinophils Relative: 0 %
HCT: 34.1 % — ABNORMAL LOW (ref 39.0–52.0)
Hemoglobin: 10.7 g/dL — ABNORMAL LOW (ref 13.0–17.0)
Immature Granulocytes: 0 %
Lymphocytes Relative: 4 %
Lymphs Abs: 0.2 10*3/uL — ABNORMAL LOW (ref 0.7–4.0)
MCH: 30.7 pg (ref 26.0–34.0)
MCHC: 31.4 g/dL (ref 30.0–36.0)
MCV: 97.7 fL (ref 80.0–100.0)
Monocytes Absolute: 0.2 10*3/uL (ref 0.1–1.0)
Monocytes Relative: 3 %
Neutro Abs: 5.5 10*3/uL (ref 1.7–7.7)
Neutrophils Relative %: 93 %
Platelets: 134 10*3/uL — ABNORMAL LOW (ref 150–400)
RBC: 3.49 MIL/uL — ABNORMAL LOW (ref 4.22–5.81)
RDW: 15.9 % — ABNORMAL HIGH (ref 11.5–15.5)
WBC: 5.9 10*3/uL (ref 4.0–10.5)
nRBC: 0 % (ref 0.0–0.2)

## 2020-09-02 LAB — COMPREHENSIVE METABOLIC PANEL
ALT: 42 U/L (ref 0–44)
AST: 48 U/L — ABNORMAL HIGH (ref 15–41)
Albumin: 2.8 g/dL — ABNORMAL LOW (ref 3.5–5.0)
Alkaline Phosphatase: 73 U/L (ref 38–126)
Anion gap: 11 (ref 5–15)
BUN: 23 mg/dL — ABNORMAL HIGH (ref 6–20)
CO2: 35 mmol/L — ABNORMAL HIGH (ref 22–32)
Calcium: 8.4 mg/dL — ABNORMAL LOW (ref 8.9–10.3)
Chloride: 90 mmol/L — ABNORMAL LOW (ref 98–111)
Creatinine, Ser: 0.75 mg/dL (ref 0.61–1.24)
GFR, Estimated: >60 mL/min (ref >60)
Glucose, Bld: 223 mg/dL — ABNORMAL HIGH (ref 70–99)
Potassium: 4.2 mmol/L (ref 3.5–5.1)
Sodium: 136 mmol/L (ref 135–145)
Total Bilirubin: 0.5 mg/dL (ref 0.3–1.2)
Total Protein: 6.8 g/dL (ref 6.5–8.1)

## 2020-09-02 LAB — D-DIMER, QUANTITATIVE: D-Dimer, Quant: 1.68 ug/mL-FEU — ABNORMAL HIGH (ref 0.00–0.50)

## 2020-09-02 LAB — FERRITIN: Ferritin: 95 ng/mL (ref 24–336)

## 2020-09-02 LAB — GLUCOSE, CAPILLARY: Glucose-Capillary: 175 mg/dL — ABNORMAL HIGH (ref 70–99)

## 2020-09-02 LAB — PROTIME-INR
INR: 2.5 — ABNORMAL HIGH (ref 0.8–1.2)
Prothrombin Time: 26.7 seconds — ABNORMAL HIGH (ref 11.4–15.2)

## 2020-09-02 LAB — C-REACTIVE PROTEIN: CRP: 6.1 mg/dL — ABNORMAL HIGH (ref <1.0)

## 2020-09-02 MED ORDER — WARFARIN SODIUM 5 MG PO TABS
5.0000 mg | ORAL_TABLET | Freq: Every day | ORAL | Status: DC
  Filled 2020-09-02: qty 1

## 2020-09-02 MED ORDER — WARFARIN SODIUM 5 MG PO TABS
5.0000 mg | ORAL_TABLET | Freq: Every day | ORAL | Status: DC
  Administered 2020-09-02 – 2020-09-04 (×3): 5 mg via ORAL
  Filled 2020-09-02 (×4): qty 1

## 2020-09-02 MED ORDER — METHOCARBAMOL 500 MG PO TABS
500.0000 mg | ORAL_TABLET | Freq: Three times a day (TID) | ORAL | Status: DC | PRN
  Administered 2020-09-02 – 2020-09-04 (×5): 500 mg via ORAL
  Filled 2020-09-02 (×7): qty 1

## 2020-09-02 NOTE — Progress Notes (Signed)
ANTICOAGULATION CONSULT NOTE  Pharmacy Consult for Warfarin Indication: atrial fibrillation and pulmonary embolus  No Known Allergies  Patient Measurements: Height: 5\' 8"  (172.7 cm) Weight: (!) 176.6 kg (389 lb 5.3 oz) IBW/kg (Calculated) : 68.4  Vital Signs: Temp: 97.8 F (36.6 C) (07/19 0500) Temp Source: Oral (07/19 0500) BP: 116/102 (07/19 0811) Pulse Rate: 90 (07/19 0811)  Labs: Recent Labs    08/30/20 1203 08/30/20 1415 08/30/20 1604 08/31/20 0520 08/31/20 0520 09/01/20 0648 09/02/20 0557  HGB  --   --   --  11.0*   < > 10.7* 10.7*  HCT  --   --   --  34.3*  --  33.1* 34.1*  PLT  --   --   --  153  --  124* 134*  LABPROT  --   --   --  16.6*  --  21.0* 26.7*  INR  --   --   --  1.4*  --  1.8* 2.5*  CREATININE  --   --   --  0.65  --  0.83 0.75  TROPONINIHS 40* 38* 39*  --   --   --   --    < > = values in this interval not displayed.     Estimated Creatinine Clearance: 157.1 mL/min (by C-G formula based on SCr of 0.75 mg/dL).   Medical History: Past Medical History:  Diagnosis Date   Allergy    Anxiety    Arthritis    Asthma    Brain damage    Chronic pain of both knees 07/13/2018   Clotting disorder (HCC)    COPD (chronic obstructive pulmonary disease) (HCC)    Depression    GERD (gastroesophageal reflux disease)    HFrEF (heart failure with reduced ejection fraction) (HCC)    a. 03/2018 Echo: EF 25-30%, diff HK. Mod LAE.   History of DVT (deep vein thrombosis)    History of pulmonary embolism    a. Chronic coumadin.   Hypertension    MI (myocardial infarction) (HCC)    Morbid obesity (HCC)    Neck pain 07/13/2018   NICM (nonischemic cardiomyopathy) (HCC)    a. s/p Cath x 3 - reportedly nl cors. Last cath 2019 in [place]; b. a. 03/2018 Echo: EF 25-30%, diff HK.   Persistent atrial fibrillation (HCC)    a. 03/2018 s/p DCCV; b. CHA2DS2VASc = 1-->Xarelto (later changed to warfarin); c. 05/2018 recurrent afib-->Amio initiated.   Sleep apnea    Sleep apnea      Medications:  Pharmacy technician spoke with pt who confirmed pt takes warfarin 5 mg daily and his last dose was 7/15.   Assessment: 60 y.o. male with medical history significant of dCHF, hypertension, hyperlipidemia, diet-controlled diabetes, COPD, hypothyroidism, gout, depression, anxiety, OSA on CPAP, atrial fibrillation, PE/DVT on Coumadin, CAD, alcohol abuse, obesity, who presents with shortness of breath. Pt has a history of being subtherapeutic on warfarin and had been switched to Eliquis due to non-compliance with INR checks, however, now pt is back on warfarin. Pharmacy has been consulted for dosing/monitoring of warfarin.  Date INR Dose/Comment 7/16 1.1 7.5 mg 7/17 1.4 7.5 mg 7/18 1.8 5 mg 7/19 2.5   Goal of Therapy:  INR 2-3 Monitor platelets by anticoagulation protocol: Yes   Plan:  INR therapeutic. Continue with home dose of warfarin 5 mg daily Pt w/ CHF/lasix, allopurinol Monitor INR daily    8/19, PharmD, BCPS Clinical Pharmacist 09/02/2020,8:22 AM

## 2020-09-02 NOTE — Progress Notes (Signed)
[facility] Progress Note  Patient: [name] [name]    WUJ:[id number]  DOA: 08/30/2020     Date of Service: the patient was seen and examined on 09/02/2020  Brief hospital course: Past medical history of HFpEF, HTN, HLD, type II DM, COPD, hypothyroidism, Depression, anxiety, gout, OSA on CPAP, chronic A. 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   Presented with shortness of breath. 7/16 admitted for acute on chronic systolic CHF plus COVID-19 infection 7/17-18 transferred to stepdown unit for high CIWA. Currently plan is continue to treat CHF.  Subjective: Awake.  No nausea no vomiting.  No fever no chills.  No chest pain abdominal pain.  Requesting can of soda with every meal.  Assessment and Plan: 1.  Acute on chronic biventricular systolic heart failure Echocardiogram in 2/22 EF 30 to 35% Echocardiogram 7/16 poor acoustic window-unable to calculate LV but RV systolic function is severely reduced. Started on IV Lasix. Will continue with Lopressor 100 twice daily. May require cardiology consult but holding as the patient is responding to IV diuresis.  2.  COVID-19 infection Likely incidental. CT PE protocol negative for any significant COVID-related infiltrate. CRP mildly elevated. Started on IV remdesivir and Solu-Medrol given high risk. Not a good candidate for immunomodulator. CRP trending down.  3.  Left leg cellulitis Will treat with IV ceftriaxone.  4.  History of COPD Chronic hypoxic respiratory failure Severe OSA On CPAP chronically. Also on oxygen 4 LPM at home. Continue CPAP nightly. Continue bronchodilators. Does not appear to be having an exacerbation right now.  5.  Abdominal pain CT abdomen negative for any acute abnormality.  Will monitor.  6.  Elevated troponin History of CAD HLD Troponin elevation most likely secondary to early demand ischemia from CHF. Currently on Lipitor. Not on any antiplatelet medication.  7.  Chronic A.  fib. History of DVT PE. RVR on 7/18. Will treat with IV Lopressor. Avoid Cardizem given low EF history of heart failure. CT PE protocol as well as lower extremity Doppler negative for DVT or PE. INR finally therapeutic. Rate controlled with Lopressor.  8.  Depression and mood disorder. Acute encephalopathy metabolic Alcohol use disorder. Currently being monitored with CIWA protocol. Holding antipsychotic medication.  9.  History of gout. Holding allopurinol and colchicine for now. Currently receiving steroids.  10.  GERD. Continuing PPI.  11.  Hypothyroidism. Continue Synthroid. TSH elevated, free T4 low normal.  Will increase Synthroid dose..  12.  Thrombocytopenia. Acute in nature. Will monitor. Appears to be improving.  13 hypomagnesemia. Currently being replaced.  Scheduled Meds:  vitamin C  500 mg Oral Daily   Chlorhexidine Gluconate Cloth  6 each Topical Daily   folic acid  1 mg Oral Daily   furosemide  60 mg Intravenous Q12H   levothyroxine  88 mcg Oral QAC breakfast   LORazepam  0-4 mg Intravenous Q4H   Followed by   Melene Muller ON 09/03/2020] LORazepam  0-4 mg Intravenous Q8H   methylPREDNISolone (SOLU-MEDROL) injection  0.5 mg/kg Intravenous Q12H   Followed by   Melene Muller ON 09/03/2020] predniSONE  50 mg Oral Daily   metoprolol tartrate  100 mg Oral BID   montelukast  10 mg Oral QHS   multivitamin with minerals  1 tablet Oral Daily   mupirocin ointment  1 application Nasal BID   pantoprazole  40 mg Oral Q1200   thiamine  100 mg Oral Daily   Or   thiamine  100 mg Intravenous Daily   warfarin  5 mg Oral q1600   Warfarin - Pharmacist Dosing Inpatient   Does not apply q1600   zinc sulfate  220 mg Oral Daily   Continuous Infusions:  cefTRIAXone (ROCEPHIN)  IV 200 mL/hr at 09/01/20 2150   remdesivir 100 mg in NS 100 mL 100 mg (09/02/20 1048)   PRN Meds: acetaminophen, albuterol, diclofenac Sodium, haloperidol lactate, LORazepam **OR** LORazepam, methocarbamol,  metoprolol tartrate, morphine injection, naphazoline-glycerin, nitroGLYCERIN, ondansetron (ZOFRAN) IV, oxyCODONE-acetaminophen, SUMAtriptan, zinc oxide  Body mass index is 59.2 kg/m.        DVT Prophylaxis: Warfarinwarfarin (COUMADIN) tablet 5 mg    Advance goals of care discussion: Pt is Full code.  Family Communication: no family was present at bedside, at the time of interview.   Data Reviewed: I have personally reviewed and interpreted daily labs, tele strips, imaging. Sodium 134.  WBC and hemoglobin stable.  LFTs stable.  Serum creatinine stable.  Electrolytes stable. Platelet 124.  Serum creatinine stable.  Magnesium level 1.6.  AST mildly elevated, ALT stable. Mild leukopenia. Hemoglobin stable.  Physical Exam:  General: Appear in mild distress, no Rash; Oral Mucosa Clear, moist. no Abnormal Neck Mass Or lumps, Conjunctiva normal  Cardiovascular: S1 and S2 Present, no Murmur, Respiratory: increased respiratory effort, Bilateral Air entry present and bilateral  Crackles, no wheezes Abdomen: Bowel Sound present, Soft and no tenderness Extremities: trace Pedal edema, improving redness Neurology: alert and oriented to time, place, and person affect appropriate. no new focal deficit Gait not checked due to patient safety concerns    Vitals:   09/02/20 1500 09/02/20 1600 09/02/20 1742 09/02/20 1800  BP: (!) 161/100 (!) 160/78 (!) 162/90 (!) 148/86  Pulse: 85 (!) 105 63 75  Resp: (!) 22 17 18 16   Temp:  98.3 F (36.8 C)    TempSrc:  Oral    SpO2: 95% 96% 97% 98%  Weight:      Height:        Disposition:  Status is: Inpatient  Remains inpatient appropriate because:Altered mental status and Inpatient level of care appropriate due to severity of illness  Dispo: The patient is from: Home              Anticipated d/c is to: SNF              Patient currently is not medically stable to d/c.   Difficult to place patient No        Time spent: 35 minutes. I reviewed  all nursing notes, pharmacy notes, vitals, pertinent old records. I have discussed plan of care as described above with RN.  Author: , MD [facility] 09/02/2020 6:39 PM  To reach On-call, see care teams to locate the attending and reach out via [email or web address]. Between 7PM-7AM, please contact night-coverage If you still have difficulty reaching the attending provider, please page the [facility] (Director on Call) for [facility] on amion for assistance.

## 2020-09-03 ENCOUNTER — Other Ambulatory Visit: Payer: Self-pay

## 2020-09-03 DIAGNOSIS — M1A49X0 Other secondary chronic gout, multiple sites, without tophus (tophi): Secondary | ICD-10-CM

## 2020-09-03 DIAGNOSIS — R079 Chest pain, unspecified: Secondary | ICD-10-CM

## 2020-09-03 DIAGNOSIS — I2510 Atherosclerotic heart disease of native coronary artery without angina pectoris: Secondary | ICD-10-CM

## 2020-09-03 DIAGNOSIS — F341 Dysthymic disorder: Secondary | ICD-10-CM

## 2020-09-03 DIAGNOSIS — I10 Essential (primary) hypertension: Secondary | ICD-10-CM

## 2020-09-03 DIAGNOSIS — F3341 Major depressive disorder, recurrent, in partial remission: Secondary | ICD-10-CM

## 2020-09-03 DIAGNOSIS — I509 Heart failure, unspecified: Secondary | ICD-10-CM | POA: Diagnosis not present

## 2020-09-03 LAB — CBC WITH DIFFERENTIAL/PLATELET
Abs Immature Granulocytes: 0.04 10*3/uL (ref 0.00–0.07)
Basophils Absolute: 0 10*3/uL (ref 0.0–0.1)
Basophils Relative: 0 %
Eosinophils Absolute: 0 10*3/uL (ref 0.0–0.5)
Eosinophils Relative: 0 %
HCT: 35.4 % — ABNORMAL LOW (ref 39.0–52.0)
Hemoglobin: 11.2 g/dL — ABNORMAL LOW (ref 13.0–17.0)
Immature Granulocytes: 1 %
Lymphocytes Relative: 5 %
Lymphs Abs: 0.4 10*3/uL — ABNORMAL LOW (ref 0.7–4.0)
MCH: 30 pg (ref 26.0–34.0)
MCHC: 31.6 g/dL (ref 30.0–36.0)
MCV: 94.9 fL (ref 80.0–100.0)
Monocytes Absolute: 0.2 10*3/uL (ref 0.1–1.0)
Monocytes Relative: 3 %
Neutro Abs: 7.4 10*3/uL (ref 1.7–7.7)
Neutrophils Relative %: 91 %
Platelets: 140 10*3/uL — ABNORMAL LOW (ref 150–400)
RBC: 3.73 MIL/uL — ABNORMAL LOW (ref 4.22–5.81)
RDW: 15.5 % (ref 11.5–15.5)
WBC: 8 10*3/uL (ref 4.0–10.5)
nRBC: 0 % (ref 0.0–0.2)

## 2020-09-03 LAB — COMPREHENSIVE METABOLIC PANEL
ALT: 34 U/L (ref 0–44)
AST: 29 U/L (ref 15–41)
Albumin: 2.9 g/dL — ABNORMAL LOW (ref 3.5–5.0)
Alkaline Phosphatase: 68 U/L (ref 38–126)
Anion gap: 10 (ref 5–15)
BUN: 27 mg/dL — ABNORMAL HIGH (ref 6–20)
CO2: 37 mmol/L — ABNORMAL HIGH (ref 22–32)
Calcium: 8.6 mg/dL — ABNORMAL LOW (ref 8.9–10.3)
Chloride: 87 mmol/L — ABNORMAL LOW (ref 98–111)
Creatinine, Ser: 0.77 mg/dL (ref 0.61–1.24)
GFR, Estimated: >60 mL/min (ref >60)
Glucose, Bld: 241 mg/dL — ABNORMAL HIGH (ref 70–99)
Potassium: 4.1 mmol/L (ref 3.5–5.1)
Sodium: 134 mmol/L — ABNORMAL LOW (ref 135–145)
Total Bilirubin: 0.4 mg/dL (ref 0.3–1.2)
Total Protein: 6.4 g/dL — ABNORMAL LOW (ref 6.5–8.1)

## 2020-09-03 LAB — MAGNESIUM: Magnesium: 1.4 mg/dL — ABNORMAL LOW (ref 1.7–2.4)

## 2020-09-03 LAB — C-REACTIVE PROTEIN: CRP: 2.9 mg/dL — ABNORMAL HIGH (ref <1.0)

## 2020-09-03 LAB — PROTIME-INR
INR: 2.3 — ABNORMAL HIGH (ref 0.8–1.2)
Prothrombin Time: 25.2 seconds — ABNORMAL HIGH (ref 11.4–15.2)

## 2020-09-03 MED ORDER — COLCHICINE 0.6 MG PO TABS
0.6000 mg | ORAL_TABLET | Freq: Every day | ORAL | Status: DC
  Administered 2020-09-03 – 2020-09-05 (×3): 0.6 mg via ORAL
  Filled 2020-09-03 (×3): qty 1

## 2020-09-03 MED ORDER — TRAZODONE HCL 50 MG PO TABS
150.0000 mg | ORAL_TABLET | Freq: Every evening | ORAL | Status: DC | PRN
  Administered 2020-09-03 – 2020-09-04 (×2): 150 mg via ORAL
  Filled 2020-09-03 (×2): qty 1

## 2020-09-03 MED ORDER — INSULIN ASPART 100 UNIT/ML IJ SOLN
0.0000 [IU] | Freq: Three times a day (TID) | INTRAMUSCULAR | Status: DC
  Administered 2020-09-04: 1 [IU] via SUBCUTANEOUS
  Administered 2020-09-04: 5 [IU] via SUBCUTANEOUS
  Administered 2020-09-04 – 2020-09-05 (×2): 3 [IU] via SUBCUTANEOUS
  Filled 2020-09-03 (×5): qty 1

## 2020-09-03 MED ORDER — MAGNESIUM SULFATE 2 GM/50ML IV SOLN
2.0000 g | Freq: Once | INTRAVENOUS | Status: AC
  Administered 2020-09-03: 2 g via INTRAVENOUS
  Filled 2020-09-03 (×2): qty 50

## 2020-09-03 MED ORDER — GUAIFENESIN-DM 100-10 MG/5ML PO SYRP
5.0000 mL | ORAL_SOLUTION | ORAL | Status: DC | PRN
  Administered 2020-09-04 – 2020-09-05 (×5): 5 mL via ORAL
  Filled 2020-09-03 (×5): qty 5

## 2020-09-03 MED ORDER — ALLOPURINOL 100 MG PO TABS
100.0000 mg | ORAL_TABLET | Freq: Every day | ORAL | Status: DC
  Administered 2020-09-03 – 2020-09-05 (×3): 100 mg via ORAL
  Filled 2020-09-03 (×4): qty 1

## 2020-09-03 MED ORDER — METHYLPREDNISOLONE SODIUM SUCC 40 MG IJ SOLR
40.0000 mg | Freq: Two times a day (BID) | INTRAMUSCULAR | Status: DC
  Administered 2020-09-03 – 2020-09-05 (×5): 40 mg via INTRAVENOUS
  Filled 2020-09-03 (×5): qty 1

## 2020-09-03 MED ORDER — FUROSEMIDE 10 MG/ML IJ SOLN
80.0000 mg | Freq: Two times a day (BID) | INTRAMUSCULAR | Status: DC
  Administered 2020-09-03 – 2020-09-05 (×4): 80 mg via INTRAVENOUS
  Filled 2020-09-03 (×4): qty 8

## 2020-09-03 MED ORDER — LEVOTHYROXINE SODIUM 75 MCG PO TABS
75.0000 ug | ORAL_TABLET | Freq: Every day | ORAL | Status: DC
  Administered 2020-09-04 – 2020-09-05 (×2): 75 ug via ORAL
  Filled 2020-09-03: qty 1
  Filled 2020-09-03 (×2): qty 3
  Filled 2020-09-03: qty 1

## 2020-09-03 MED ORDER — LISINOPRIL 5 MG PO TABS
2.5000 mg | ORAL_TABLET | Freq: Every day | ORAL | Status: DC
  Administered 2020-09-03 – 2020-09-04 (×2): 2.5 mg via ORAL
  Filled 2020-09-03 (×3): qty 1

## 2020-09-03 MED ORDER — INSULIN ASPART 100 UNIT/ML IJ SOLN: 0.0000 [IU] | Freq: Every day | INTRAMUSCULAR | Status: DC

## 2020-09-03 NOTE — Progress Notes (Signed)
PROGRESS NOTE  [name] [name]  JQB:[id number] DOB: [birth year] DOA: 08/30/2020 PCP: [name], DO   Brief Narrative: Past medical history of HFpEF, HTN, HLD, type II DM, COPD, hypothyroidism, Depression, anxiety, gout, OSA on CPAP, chronic A. 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   Presented with shortness of breath. 7/16 admitted for acute on chronic systolic CHF plus COVID-19 infection 7/17-18 transferred to stepdown unit for high CIWA. Currently plan is continue to treat CHF.  Assessment & Plan: Principal Problem:   Acute on chronic systolic congestive heart failure (HCC) Active Problems:   Obstructive sleep apnea   HTN (hypertension)   Personal history of DVT (deep vein thrombosis)   Chest pain   Major depressive disorder, recurrent episode, in partial remission (HCC)   PE (pulmonary thromboembolism) (HCC)   HLD (hyperlipidemia)   CAD (coronary artery disease)   Hypothyroidism   Depression   Elevated troponin   Gout   Alcohol abuse   COPD (chronic obstructive pulmonary disease) (HCC)   Atrial fibrillation, chronic (HCC)   Hypokalemia   Abdominal pain  Chronic hypoxic respiratory failure, COPD, OSA: Currently stable on home oxygen (3L typical home dosing).  - Monitor SpO2. Avoid over oxygenating as he's at risk for CO2 retention.  - CPAP qHS - Continue bronchodilators, singulair.  Acute on chronic biventricular systolic heart failure: Echocardiogram in 2/22 EF 30 to 35%. Echocardiogram 7/16 poor acoustic window-unable to calculate LV but RV systolic function is severely reduced. - Continue lasix 60mg  IV q12h. SCr stable with rising BUN. 3L net out/24 hrs. Assessment of volume status difficult with morbid obesity.  - Continue I/O, weights - Continue metoprolol, restart lisinopril   COVID-19 infection: +7/16. Mild without infiltrates/pneumonia. CRP elevation is improving 10.7 >> 2.9  - Continue 5 days remdesivir - Taper steroids with  hyperglycemia.   Steroid-induced hyperglycemia:  - Check HbA1c - Start moderate SSI   Left leg cellulitis: Nonpurulent.  - Continue ceftriaxone x5 days.   Abdominal pain: CT abdomen negative for any acute abnormality. Improved.  Will monitor.   Elevated troponin due to demand myocardial ischemia with known CAD, HLD:  - Continue statin, beta blocker, anticoagulation.   Chronic A. fib: Had RVR 7/18 since resolved.  History of DVT PE. - Continue coumadin, monitoring INR daily - Continue metoprolol.    Depression and mood disorder. Acute encephalopathy metabolic Alcohol use disorder. - No longer appears to be in alcohol withdrawal. Denies frequent use.    History of gout:  - Continue allopurinol and colchicine   Osteoarthritis, chronic pain:  - Multimodal pain medications, voltaren, PT/OT, robaxin prn.   GERD. - Continue PPI.   Hypothyroidism: TSH mildly elevated but free T4 normal in setting of acute illness.  - Continue synthroid at home dose, recheck at follow up.    Thrombocytopenia: Likely due to covid. Improving. Will monitor.    Hypomagnesemia:  - Supplement and monitor  Morbid obesity: Estimated body mass index is 60.18 kg/m as calculated from the following:   Height as of this encounter: 5\' 8"  (1.727 m).   Weight as of this encounter: 179.5 kg.  DVT prophylaxis: Coumadin Code Status: Full Family Communication: None at bedside Disposition Plan:  Status is: Inpatient  Remains inpatient appropriate because:Inpatient level of care appropriate due to severity of illness  Dispo: The patient is from: Home              Anticipated d/c is to: Home  Patient currently is not medically stable to d/c.   Difficult to place patient No  Consultants:  None  Procedures:  None  Antimicrobials: Remdesivir, ceftriaxone   Subjective: Short of breath moderately with any exertion. Having pain in right shoulder, both knees and hips which is severe,  constant, worse with movement, improved with pain medications, but is chronically in this pain. Requesting soma.   Objective: Vitals:   09/03/20 0600 09/03/20 0800 09/03/20 0929 09/03/20 1215  BP: (!) 122/101 (!) 150/88 (!) 135/95 123/72  Pulse: 78 97 70 82  Resp: (!) 23 (!) 22 18 17   Temp:  98.4 F (36.9 C) 97.7 F (36.5 C) 98.1 F (36.7 C)  TempSrc:  Oral Axillary Oral  SpO2: 96% 95%  97%  Weight:   (!) 179.5 kg   Height:   5\' 8"  (1.727 m)     Intake/Output Summary (Last 24 hours) at 09/03/2020 1534 Last data filed at 09/03/2020 1504 Gross per 24 hour  Intake 28.76 ml  Output 3450 ml  Net -3421.24 ml   Filed Weights   09/02/20 0500 09/03/20 0500 09/03/20 0929  Weight: (!) 176.6 kg (!) 178 kg (!) 179.5 kg   Gen: Morbidly obese male in no distress Pulm: Non-labored breathing supplemental oxygen. Distant without wheezes or crackles.  CV: Regular rate and rhythm. No murmur, rub, or gallop. UTD JVD, +pitting  pedal edema. GI: Abdomen soft, non-tender, non-distended, with normoactive bowel sounds. No organomegaly or masses felt. Ext: Warm, dry. NVI at toes bilaterally. Lower legs wrapped.  Skin: Intertriginous areas with MASD.  Neuro: Alert and oriented. No focal neurological deficits. Psych: Judgement and insight appear normal. Mood & affect appropriate.   Data Reviewed: I have personally reviewed following labs and imaging studies  CBC: Recent Labs  Lab 08/30/20 0819 08/31/20 0520 09/01/20 0648 09/02/20 0557 09/03/20 0511  WBC 8.5 10.3 5.8 5.9 8.0  NEUTROABS  --   --  5.0 5.5 7.4  HGB 11.3* 11.0* 10.7* 10.7* 11.2*  HCT 34.0* 34.3* 33.1* 34.1* 35.4*  MCV 90.4 94.8 96.5 97.7 94.9  PLT 194 153 124* 134* 140*   Basic Metabolic Panel: Recent Labs  Lab 08/30/20 0819 08/30/20 1203 08/31/20 0520 09/01/20 0648 09/02/20 0557 09/03/20 0511  NA 136  --  137 136 136 134*  K 3.4*  --  4.0 4.1 4.2 4.1  CL 94*  --  92* 92* 90* 87*  CO2 29  --  35* 35* 35* 37*  GLUCOSE  159*  --  116* 118* 223* 241*  BUN 14  --  13 16 23* 27*  CREATININE 0.55*  --  0.65 0.83 0.75 0.77  CALCIUM 8.4*  --  8.6* 8.6* 8.4* 8.6*  MG  --  1.7 1.6* 1.6*  --  1.4*   GFR: Estimated Creatinine Clearance: 158.6 mL/min (by C-G formula based on SCr of 0.77 mg/dL). Liver Function Tests: Recent Labs  Lab 09/01/20 0648 09/02/20 0557 09/03/20 0511  AST 57* 48* 29  ALT 40 42 34  ALKPHOS 80 73 68  BILITOT 0.5 0.5 0.4  PROT 6.7 6.8 6.4*  ALBUMIN 3.0* 2.8* 2.9*   Recent Labs  Lab 08/30/20 1203  LIPASE 24   Recent Labs  Lab 09/01/20 0648  AMMONIA <9*   Coagulation Profile: Recent Labs  Lab 08/30/20 0819 08/31/20 0520 09/01/20 0648 09/02/20 0557 09/03/20 0511  INR 1.1 1.4* 1.8* 2.5* 2.3*   Cardiac Enzymes: No results for input(s): CKTOTAL, CKMB, CKMBINDEX, TROPONINI in the last 168  hours. BNP (last 3 results) No results for input(s): PROBNP in the last 8760 hours. HbA1C: No results for input(s): HGBA1C in the last 72 hours. CBG: Recent Labs  Lab 08/31/20 1625 08/31/20 2012 09/01/20 0031 09/01/20 0734 09/02/20 0755  GLUCAP 177* 176* 144* 118* 175*   Lipid Profile: No results for input(s): CHOL, HDL, LDLCALC, TRIG, CHOLHDL, LDLDIRECT in the last 72 hours. Thyroid Function Tests: Recent Labs    09/01/20 0648  TSH 7.681*  FREET4 0.72   Anemia Panel: Recent Labs    09/01/20 0648 09/02/20 0557  VITAMINB12 476  --   FERRITIN 117 95   Urine analysis:    Component Value Date/Time   COLORURINE YELLOW (A) 07/02/2020 1358   APPEARANCEUR HAZY (A) 07/02/2020 1358   APPEARANCEUR Hazy (A) 12/25/2018 1442   LABSPEC 1.009 07/02/2020 1358   PHURINE 6.0 07/02/2020 1358   GLUCOSEU NEGATIVE 07/02/2020 1358   HGBUR SMALL (A) 07/02/2020 1358   BILIRUBINUR NEGATIVE 07/02/2020 1358   BILIRUBINUR Negative 12/25/2018 1442   KETONESUR NEGATIVE 07/02/2020 1358   PROTEINUR NEGATIVE 07/02/2020 1358   NITRITE NEGATIVE 07/02/2020 1358   LEUKOCYTESUR LARGE (A)  07/02/2020 1358   Recent Results (from the past 240 hour(s))  SARS CORONAVIRUS 2 (TAT 6-24 HRS) Nasopharyngeal Nasopharyngeal Swab     Status: Abnormal   Collection Time: 08/30/20 11:20 AM   Specimen: Nasopharyngeal Swab  Result Value Ref Range Status   SARS Coronavirus 2 POSITIVE (A) NEGATIVE Final    Comment: (NOTE) SARS-CoV-2 target nucleic acids are DETECTED.  The SARS-CoV-2 RNA is generally detectable in upper and lower respiratory specimens during the acute phase of infection. Positive results are indicative of the presence of SARS-CoV-2 RNA. Clinical correlation with patient history and other diagnostic information is  necessary to determine patient infection status. Positive results do not rule out bacterial infection or co-infection with other viruses.  The expected result is Negative.  Fact Sheet for Patients: HairSlick.no  Fact Sheet for Healthcare Providers: quierodirigir.com  This test is not yet approved or cleared by the [place] FDA and  has been authorized for detection and/or diagnosis of SARS-CoV-2 by FDA under an Emergency Use Authorization (EUA). This EUA will remain  in effect (meaning this test can be used) for the duration of the COVID-19 declaration under Section 564(b)(1) of the Act, 21 U. S.C. section 360bbb-3(b)(1), unless the authorization is terminated or revoked sooner.   Performed at [facility], [address] [place], [place] [address]   MRSA Next Gen by PCR, Nasal     Status: Abnormal   Collection Time: 09/01/20 12:52 AM   Specimen: Nasal Mucosa; Nasal Swab  Result Value Ref Range Status   MRSA by PCR Next Gen DETECTED (A) NOT DETECTED Final    Comment: RESULT CALLED TO, READ BACK BY AND VERIFIED WITH: KERRI STIPANOV @ 023 09/01/20 LFD (NOTE) The GeneXpert MRSA Assay (FDA approved for NASAL specimens only), is one component of a comprehensive MRSA colonization  surveillance program. It is not intended to diagnose MRSA infection nor to guide or monitor treatment for MRSA infections. Test performance is not FDA approved in patients less than 71 years old. Performed at [facility], [address] [place], [place] [address]       Radiology Studies: No results found.  Scheduled Meds:  vitamin C  500 mg Oral Daily   Chlorhexidine Gluconate Cloth  6 each Topical Daily   folic acid  1 mg Oral Daily  furosemide  60 mg Intravenous Q12H   insulin aspart  0-15 Units Subcutaneous TID WC   insulin aspart  0-5 Units Subcutaneous QHS   [START ON 09/04/2020] levothyroxine  75 mcg Oral QAC breakfast   LORazepam  0-4 mg Intravenous Q8H   methylPREDNISolone (SOLU-MEDROL) injection  40 mg Intravenous Q12H   metoprolol tartrate  100 mg Oral BID   montelukast  10 mg Oral QHS   multivitamin with minerals  1 tablet Oral Daily   mupirocin ointment  1 application Nasal BID   pantoprazole  40 mg Oral Q1200   thiamine  100 mg Oral Daily   Or   thiamine  100 mg Intravenous Daily   warfarin  5 mg Oral q1600   Warfarin - Pharmacist Dosing Inpatient   Does not apply q1600   zinc sulfate  220 mg Oral Daily   Continuous Infusions:  cefTRIAXone (ROCEPHIN)  IV 1 g (09/02/20 2010)   remdesivir 100 mg in NS 100 mL 100 mg (09/02/20 1048)     LOS: 4 days   Time spent: 25 minutes.  Tyrone Nine, MD [facility] [email or web address] 09/03/2020, 3:34 PM

## 2020-09-03 NOTE — Evaluation (Signed)
Occupational Therapy Evaluation Patient Details Name: [name] [name] MRN: [id number] DOB: 02-29-1960 Today's Date: 09/03/2020    History of Present Illness PT is a 60 y/o M with PMH: HFpEF, HTN, HLD, type II DM, COPD, hypothyroidism, Depression, anxiety, gout, OSA on CPAP, chronic A. 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    Presented with shortness of breath.  7/16 admitted for acute on chronic systolic CHF plus COVID-19 infection.   Clinical Impression   Pt seen for OT eval this date in setting of acute hospitalization d/t COVID. Pt endorses generally decreased fxl activity tolerance at baseline 2/2 chronic pain, but states that he was able to do most of his basics self care and mobility until coming down with COVID in recent days prior to this hospitalization. Pt presents with decreased fxl activity tolerance and generalized weakness versus his baseline. On ADL assessment this date, pt requires: SETUP to MIN A for seated UB ADLs (SETUP self feeding, MIN A for UB dressing), MAX A for seated LB ADLs including threading underwear. Pt tolerates STS from chair with bari RW with SUPV, but demos standing tolerance of only ~20-30 seconds. In addition, pt endorsing decreased understanding of meal planning and diet mgt as it pertains to improving his general weight. OT ed re: how offloading his joints could reduce pain, improve tolerance for ROM and expand INDEP with self care. Pt receptive to meal planning education with OT. Will continue to follow acutely. Anticipate that pt is not safe to return to home environment with his very limited support. Anticipate he will require f.u OT Services in STR setting.     Follow Up Recommendations  SNF    Equipment Recommendations  Hospital bed    Recommendations for Other Services       Precautions / Restrictions Precautions Precautions: Fall Restrictions Weight Bearing Restrictions: No      Mobility Bed Mobility               General  bed mobility comments: up to chair pre/post    Transfers Overall transfer level: Needs assistance Equipment used: Rolling walker (2 wheeled) (bari RW) Transfers: Sit to/from Stand Sit to Stand: Supervision         General transfer comment: increased time, effortful    Balance Overall balance assessment: Mild deficits observed, not formally tested                                         ADL either performed or assessed with clinical judgement   ADL Overall ADL's : Needs assistance/impaired                                       General ADL Comments: SETUP to MIN A for seated UB ADLs (SETUP self feeding, MIN A for UB dressing), MAX A for seated LB ADLs including threading underwear.     Vision Patient Visual Report: No change from baseline       Perception     Praxis      Pertinent Vitals/Pain Pain Assessment: 0-10 Pain Score: 6  Pain Location: R shld and R hip chronic Pain Descriptors / Indicators: Aching;Sore Pain Intervention(s): Limited activity within patient's tolerance;Monitored during session     Hand Dominance Right   Extremity/Trunk Assessment Upper Extremity Assessment Upper Extremity Assessment: Generalized  weakness (R shld limited d/t chronic pain)   Lower Extremity Assessment Lower Extremity Assessment: Generalized weakness (R hip limimted 2/2 chronic pain)       Communication Communication Communication: No difficulties   Cognition Arousal/Alertness: Awake/alert Behavior During Therapy: WFL for tasks assessed/performed Overall Cognitive Status: Within Functional Limits for tasks assessed                                 General Comments: can sometimes be inappropriate, but generally able to follow all commands and he is A&Ox4   General Comments       Exercises Other Exercises Other Exercises: ed re: importance of diet as it relates to DM mgt as well as how weight r/t joint pain and limits  his ADL/ADL mobility INDEP. Pt with good udnerstanding and receptive to practicing meal planning   Shoulder Instructions      Home Living Family/patient expects to be discharged to:: Private residence Living Arrangements: Other (Comment) ("roommate") Available Help at Discharge:  (pt used to have PCA, but has been over 6 months at this point given his several admissions) Type of Home: Apartment Home Access: Level entry     Home Layout: One level     Bathroom Shower/Tub: Chief Strategy Officer: Standard     Home Equipment: Environmental consultant - 4 wheels;Shower seat;Hand held shower head;Wheelchair - power   Additional Comments: Sleeps in regular bed, lift chair      Prior Functioning/Environment Level of Independence: Needs assistance  Gait / Transfers Assistance Needed: Bariatric rollator and scooter for functional mobility, more difficulty with amb recently, several recent hospital admissions ADL's / Homemaking Assistance Needed: pt used to have aide help, reports increased diffiuclty with HH IADLs and even aspects of general self care, especially since becoming ill with COVID   Comments: Pt reports having transportation for appointments        OT Problem List: Decreased strength;Decreased activity tolerance;Pain      OT Treatment/Interventions: Self-care/ADL training;Therapeutic activities;Therapeutic exercise    OT Goals(Current goals can be found in the care plan section) Acute Rehab OT Goals Patient Stated Goal: to get my weight and joint pain under control OT Goal Formulation: With patient Time For Goal Achievement: 09/17/20 Potential to Achieve Goals: Good ADL Goals Pt Will Perform Upper Body Dressing: with supervision;sitting (with modified technique as needed to accomodate chronic pain in R shoulder) Pt Will Transfer to Toilet: with supervision;ambulating;bedside commode (with bari RW, ~5-10' to [facility] to increase tolerance for fxl distances) Pt/caregiver will  Perform Home Exercise Program: Increased strength (core strength for dynamic tasks) Additional ADL Goal #1: Pt will complete meal planning task with improved understanding of macros (at least identification of carbohydrates given his DM dx) with <10% verbal cues to plan and write 2 healthy meals.  OT Frequency: Min 1X/week   Barriers to D/C:            Co-evaluation              AM-PAC OT "6 Clicks" Daily Activity     Outcome Measure Help from another person eating meals?: None Help from another person taking care of personal grooming?: None Help from another person toileting, which includes using toliet, bedpan, or urinal?: A Lot Help from another person bathing (including washing, rinsing, drying)?: A Lot Help from another person to put on and taking off regular upper body clothing?: A Little Help from another person  to put on and taking off regular lower body clothing?: A Lot 6 Click Score: 17   End of Session Equipment Utilized During Treatment: Engineer, water Communication: Mobility status  Activity Tolerance: Patient tolerated treatment well Patient left: in chair;with call bell/phone within reach;with chair alarm set  OT Visit Diagnosis: Unsteadiness on feet (R26.81);Muscle weakness (generalized) (M62.81);Pain Pain - Right/Left: Right Pain - part of body: Shoulder                Time: 7253-6644 OT Time Calculation (min): 25 min Charges:  OT General Charges $OT Visit: 1 Visit OT Evaluation $OT Eval Moderate Complexity: 1 Mod OT Treatments $Self Care/Home Management : 8-22 mins  Rejeana Brock, MS, OTR/L ascom [phone] 09/03/20, 6:05 PM

## 2020-09-03 NOTE — Evaluation (Signed)
Physical Therapy Evaluation Patient Details Name: [name] [name] MRN: [id number] DOB: [birth year] Today's Date: 09/03/2020   History of Present Illness  PT is a 59 y/o M with PMH: HFpEF, HTN, HLD, type II DM, COPD, hypothyroidism, Depression, anxiety, gout, OSA on CPAP, chronic A. 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    Presented with shortness of breath.  7/16 admitted for acute on chronic systolic CHF plus COVID-19 infection.  Clinical Impression  Pt c/o more pain than normal as well as generally feeling very weak but he was willing to try some mobility and limited ambulation.  He was on 3L the entire time and though he had some shortness of breath his vitals were stable t/o the effort, including during mobility/standing.  Despite repeating how weak he feels he is adamant that he will not spend his birthday (3 days from now, this Saturday) in the hospital and despite his status is very keen on getting out by then.  Repeatedly tried to educate on the risk, pt well known to this PT and while he was very pleasant t/o the session he can also be very stubborn.  Will maintain on PT caseload and attempt to increase his mobility, ambulation, activity tolerance and address other functional impairments listed below as appropriate.     Follow Up Recommendations Supervision/Assistance - 24 hour;Home health PT (pt refusing anything other than d/c'ing home)    Equipment Recommendations  None recommended by PT    Recommendations for Other Services       Precautions / Restrictions Precautions Precautions: Fall Precaution Comments: ` Restrictions Weight Bearing Restrictions: No      Mobility  Bed Mobility Overal bed mobility: Needs Assistance Bed Mobility: Supine to Sit     Supine to sit: Min guard     General bed mobility comments: up to chair pre/post    Transfers Overall transfer level: Needs assistance Equipment used: Rolling walker (2 wheeled) (bari RW) Transfers: Sit  to/from Stand Sit to Stand: Supervision         General transfer comment: increased time, effortful  Ambulation/Gait Ambulation/Gait assistance: Min guard Gait Distance (Feet): 5 Feet Assistive device: Rolling walker (2 wheeled)       General Gait Details: Pt was able to take a few forward and then turning steps to get to the recliner.  He reports global pain/soreness in b/l LEs as well as R heel but ultimately was able to safely manage short term upright amb/transfer w/o need for direct phyiscal assist.  Stairs            Wheelchair Mobility    Modified Rankin (Stroke Patients Only)       Balance Overall balance assessment: Mild deficits observed, not formally tested Sitting-balance support: No upper extremity supported;Feet supported Sitting balance-Leahy Scale: Good     Standing balance support: Bilateral upper extremity supported Standing balance-Leahy Scale: Fair Standing balance comment: Pt with poor tolerance in standing, but did maintain balance with heavy UE well for brief amb/trans to recliner                             Pertinent Vitals/Pain Pain Assessment: 0-10 Pain Score: 6  Pain Location: R shld and R hip chronic Pain Descriptors / Indicators: Aching;Sore Pain Intervention(s): Limited activity within patient's tolerance;Monitored during session    Home Living Family/patient expects to be discharged to:: Private residence Living Arrangements: Other (Comment) ("roommate") Available Help at Discharge:  (pt used  to have PCA, but has been over 6 months at this point given his several admissions) Type of Home: Apartment Home Access: Level entry     Home Layout: One level Home Equipment: Walker - 4 wheels;Shower seat;Hand held shower head;Wheelchair - power Additional Comments: Sleeps in regular bed, lift chair    Prior Function Level of Independence: Needs assistance   Gait / Transfers Assistance Needed: Bariatric rollator and  scooter for functional mobility, more difficulty with amb recently, several recent hospital admissions  ADL's / Homemaking Assistance Needed: pt used to have aide help, reports increased diffiuclty with HH IADLs and even aspects of general self care, especially since becoming ill with COVID  Comments: Pt reports having transportation for appointments     Hand Dominance   Dominant Hand: Right    Extremity/Trunk Assessment   Upper Extremity Assessment Upper Extremity Assessment: Generalized weakness (R shld limited d/t chronic pain)    Lower Extremity Assessment Lower Extremity Assessment: Generalized weakness (R hip limimted 2/2 chronic pain)       Communication   Communication: No difficulties  Cognition Arousal/Alertness: Awake/alert Behavior During Therapy: WFL for tasks assessed/performed Overall Cognitive Status: Within Functional Limits for tasks assessed                                 General Comments: can sometimes be inappropriate, but generally able to follow all commands and he is A&Ox4      General Comments General comments (skin integrity, edema, etc.): pt reports feeling generally weak and having more LE pain than his baseline, but ultimately relatively safe with very brief/limited mobility tasks    Exercises Other Exercises Other Exercises: ed re: importance of diet as it relates to DM mgt as well as how weight r/t joint pain and limits his ADL/ADL mobility INDEP. Pt with good udnerstanding and receptive to practicing meal planning   Assessment/Plan    PT Assessment Patient needs continued PT services  PT Problem List Decreased strength;Decreased range of motion;Decreased activity tolerance;Decreased balance;Decreased mobility;Decreased safety awareness;Cardiopulmonary status limiting activity;Pain       PT Treatment Interventions DME instruction;Gait training;Stair training;Functional mobility training;Therapeutic activities;Therapeutic  exercise;Balance training;Patient/family education    PT Goals (Current goals can be found in the Care Plan section)  Acute Rehab PT Goals Patient Stated Goal: to get my weight and joint pain under control PT Goal Formulation: With patient Time For Goal Achievement: 09/17/20 Potential to Achieve Goals: Fair    Frequency Min 2X/week   Barriers to discharge        Co-evaluation               AM-PAC PT "6 Clicks" Mobility  Outcome Measure Help needed turning from your back to your side while in a flat bed without using bedrails?: A Little Help needed moving from lying on your back to sitting on the side of a flat bed without using bedrails?: A Little Help needed moving to and from a bed to a chair (including a wheelchair)?: A Little Help needed standing up from a chair using your arms (e.g., wheelchair or bedside chair)?: A Little Help needed to walk in hospital room?: A Lot Help needed climbing 3-5 steps with a railing? : Total 6 Click Score: 15    End of Session Equipment Utilized During Treatment: Oxygen (3L) Activity Tolerance: Patient limited by fatigue;Patient limited by pain Patient left: with chair alarm set;with call bell/phone within reach  Nurse Communication: Mobility status PT Visit Diagnosis: Muscle weakness (generalized) (M62.81);Difficulty in walking, not elsewhere classified (R26.2);Pain;Unsteadiness on feet (R26.81) Pain - Right/Left: Right Pain - part of body: Hip    Time: [id number] PT Time Calculation (min) (ACUTE ONLY): 33 min   Charges:   PT Evaluation $PT Eval Low Complexity: 1 Low PT Treatments $Therapeutic Activity: 8-22 mins        Malachi Pro, DPT 09/03/2020, 6:04 PM

## 2020-09-03 NOTE — Plan of Care (Signed)
Report called to Wynelle Link, RN. She will continue care once transferred to 2A RM 239.

## 2020-09-03 NOTE — Progress Notes (Signed)
ANTICOAGULATION CONSULT NOTE  Pharmacy Consult for Warfarin Indication: atrial fibrillation and pulmonary embolus  No Known Allergies  Patient Measurements: Height: 5\' 8"  (172.7 cm) Weight: (!) 178 kg (392 lb 6.7 oz) IBW/kg (Calculated) : 68.4  Vital Signs: Temp: 98.5 F (36.9 C) (07/20 0400) Temp Source: Oral (07/20 0400) BP: 122/101 (07/20 0600) Pulse Rate: 78 (07/20 0600)  Labs: Recent Labs    09/01/20 0648 09/02/20 0557 09/03/20 0511  HGB 10.7* 10.7* 11.2*  HCT 33.1* 34.1* 35.4*  PLT 124* 134* 140*  LABPROT 21.0* 26.7* 25.2*  INR 1.8* 2.5* 2.3*  CREATININE 0.83 0.75 0.77     Estimated Creatinine Clearance: 157.8 mL/min (by C-G formula based on SCr of 0.77 mg/dL).   Medical History: Past Medical History:  Diagnosis Date   Allergy    Anxiety    Arthritis    Asthma    Brain damage    Chronic pain of both knees 07/13/2018   Clotting disorder (HCC)    COPD (chronic obstructive pulmonary disease) (HCC)    Depression    GERD (gastroesophageal reflux disease)    HFrEF (heart failure with reduced ejection fraction) (HCC)    a. 03/2018 Echo: EF 25-30%, diff HK. Mod LAE.   History of DVT (deep vein thrombosis)    History of pulmonary embolism    a. Chronic coumadin.   Hypertension    MI (myocardial infarction) (HCC)    Morbid obesity (HCC)    Neck pain 07/13/2018   NICM (nonischemic cardiomyopathy) (HCC)    a. s/p Cath x 3 - reportedly nl cors. Last cath 2019 in [place]; b. a. 03/2018 Echo: EF 25-30%, diff HK.   Persistent atrial fibrillation (HCC)    a. 03/2018 s/p DCCV; b. CHA2DS2VASc = 1-->Xarelto (later changed to warfarin); c. 05/2018 recurrent afib-->Amio initiated.   Sleep apnea    Sleep apnea     Medications:  Pharmacy technician spoke with pt who confirmed pt takes warfarin 5 mg daily and his last dose was 7/15.   Assessment: 60 y.o. male with medical history significant of dCHF, hypertension, hyperlipidemia, diet-controlled diabetes, COPD,  hypothyroidism, gout, depression, anxiety, OSA on CPAP, atrial fibrillation, PE/DVT on Coumadin, CAD, alcohol abuse, obesity, who presents with shortness of breath. Pt has a history of being subtherapeutic on warfarin and had been switched to Eliquis due to non-compliance with INR checks, however, now pt is back on warfarin. Pharmacy has been consulted for dosing/monitoring of warfarin.  Date INR Dose/Comment 7/16 1.1 7.5 mg 7/17 1.4 7.5 mg 7/18 1.8 5 mg (0 >100% meals eaten) 7/19 2.5 5 mg  7/20 2.3 5 mg   Goal of Therapy:  INR 2-3 Monitor platelets by anticoagulation protocol: Yes   Plan:  INR therapeutic. Continue with home dose of warfarin 5 mg daily Pt w/ CHF/lasix, allopurinol, Ceftriaxone, solumedrol 91mg  BID Monitor INR daily    8/20, PharmD, [facility] Clinical Pharmacist 09/03/2020,8:09 AM

## 2020-09-03 NOTE — Patient Outreach (Signed)
Care Coordination  09/03/2020  Dorris Vangorder 1960-12-15 [id number]  Desman Polak is currently admitted as an inpatient at [facility]. [facility] Managed Care team will follow the progress of Almer Bushey and follow up upon discharge.   Estanislado Emms RN, BSN [place]  [employer]

## 2020-09-03 NOTE — Progress Notes (Signed)
Glucose meter not able to validate pt ID number. Pt's CBG 199 at 2115, RN notified.

## 2020-09-04 DIAGNOSIS — I509 Heart failure, unspecified: Secondary | ICD-10-CM | POA: Diagnosis not present

## 2020-09-04 LAB — COMPREHENSIVE METABOLIC PANEL
ALT: 28 U/L (ref 0–44)
AST: 22 U/L (ref 15–41)
Albumin: 3.2 g/dL — ABNORMAL LOW (ref 3.5–5.0)
Alkaline Phosphatase: 67 U/L (ref 38–126)
Anion gap: 11 (ref 5–15)
BUN: 30 mg/dL — ABNORMAL HIGH (ref 6–20)
CO2: 41 mmol/L — ABNORMAL HIGH (ref 22–32)
Calcium: 8.8 mg/dL — ABNORMAL LOW (ref 8.9–10.3)
Chloride: 85 mmol/L — ABNORMAL LOW (ref 98–111)
Creatinine, Ser: 0.87 mg/dL (ref 0.61–1.24)
GFR, Estimated: >60 mL/min (ref >60)
Glucose, Bld: 182 mg/dL — ABNORMAL HIGH (ref 70–99)
Potassium: 3.9 mmol/L (ref 3.5–5.1)
Sodium: 137 mmol/L (ref 135–145)
Total Bilirubin: 0.5 mg/dL (ref 0.3–1.2)
Total Protein: 7 g/dL (ref 6.5–8.1)

## 2020-09-04 LAB — GLUCOSE, CAPILLARY
Glucose-Capillary: 171 mg/dL — ABNORMAL HIGH (ref 70–99)
Glucose-Capillary: 198 mg/dL — ABNORMAL HIGH (ref 70–99)
Glucose-Capillary: 199 mg/dL — ABNORMAL HIGH (ref 70–99)
Glucose-Capillary: 167 mg/dL — ABNORMAL HIGH (ref 70–99)
Glucose-Capillary: 155 mg/dL — ABNORMAL HIGH (ref 70–99)
Glucose-Capillary: 124 mg/dL — ABNORMAL HIGH (ref 70–99)
Glucose-Capillary: 248 mg/dL — ABNORMAL HIGH (ref 70–99)

## 2020-09-04 LAB — CBC WITH DIFFERENTIAL/PLATELET
Abs Immature Granulocytes: 0.05 10*3/uL (ref 0.00–0.07)
Basophils Absolute: 0 10*3/uL (ref 0.0–0.1)
Basophils Relative: 0 %
Eosinophils Absolute: 0 10*3/uL (ref 0.0–0.5)
Eosinophils Relative: 0 %
HCT: 38.1 % — ABNORMAL LOW (ref 39.0–52.0)
Hemoglobin: 12.2 g/dL — ABNORMAL LOW (ref 13.0–17.0)
Immature Granulocytes: 1 %
Lymphocytes Relative: 9 %
Lymphs Abs: 0.6 10*3/uL — ABNORMAL LOW (ref 0.7–4.0)
MCH: 30.1 pg (ref 26.0–34.0)
MCHC: 32 g/dL (ref 30.0–36.0)
MCV: 94.1 fL (ref 80.0–100.0)
Monocytes Absolute: 0.4 10*3/uL (ref 0.1–1.0)
Monocytes Relative: 6 %
Neutro Abs: 5.4 10*3/uL (ref 1.7–7.7)
Neutrophils Relative %: 84 %
Platelets: 166 10*3/uL (ref 150–400)
RBC: 4.05 MIL/uL — ABNORMAL LOW (ref 4.22–5.81)
RDW: 15.2 % (ref 11.5–15.5)
WBC: 6.4 10*3/uL (ref 4.0–10.5)
nRBC: 0 % (ref 0.0–0.2)

## 2020-09-04 LAB — C-REACTIVE PROTEIN: CRP: 1.7 mg/dL — ABNORMAL HIGH (ref <1.0)

## 2020-09-04 LAB — HEMOGLOBIN A1C
Hgb A1c MFr Bld: 6.3 % — ABNORMAL HIGH (ref 4.8–5.6)
Mean Plasma Glucose: 134.11 mg/dL

## 2020-09-04 LAB — PROTIME-INR
INR: 2.3 — ABNORMAL HIGH (ref 0.8–1.2)
Prothrombin Time: 24.9 seconds — ABNORMAL HIGH (ref 11.4–15.2)

## 2020-09-04 MED ORDER — POLYVINYL ALCOHOL 1.4 % OP SOLN
1.0000 [drp] | OPHTHALMIC | Status: DC | PRN
  Administered 2020-09-04: 1 [drp] via OPHTHALMIC
  Filled 2020-09-04: qty 15

## 2020-09-04 NOTE — Progress Notes (Signed)
ANTICOAGULATION CONSULT NOTE  Pharmacy Consult for Warfarin Indication: atrial fibrillation and pulmonary embolus  No Known Allergies  Patient Measurements: Height: 5\' 8"  (172.7 cm) Weight: (!) 176.8 kg (389 lb 12.4 oz) IBW/kg (Calculated) : 68.4  Vital Signs: Temp: 97.8 F (36.6 C) (07/21 0506) Temp Source: Oral (07/20 2030) BP: 133/103 (07/21 0506) Pulse Rate: 81 (07/21 0506)  Labs: Recent Labs    09/02/20 0557 09/03/20 0511 09/04/20 0518  HGB 10.7* 11.2* 12.2*  HCT 34.1* 35.4* 38.1*  PLT 134* 140* 166  LABPROT 26.7* 25.2* 24.9*  INR 2.5* 2.3* 2.3*  CREATININE 0.75 0.77 0.87     Estimated Creatinine Clearance: 144.6 mL/min (by C-G formula based on SCr of 0.87 mg/dL).   Medical History: Past Medical History:  Diagnosis Date   Allergy    Anxiety    Arthritis    Asthma    Brain damage    Chronic pain of both knees 07/13/2018   Clotting disorder (HCC)    COPD (chronic obstructive pulmonary disease) (HCC)    Depression    GERD (gastroesophageal reflux disease)    HFrEF (heart failure with reduced ejection fraction) (HCC)    a. 03/2018 Echo: EF 25-30%, diff HK. Mod LAE.   History of DVT (deep vein thrombosis)    History of pulmonary embolism    a. Chronic coumadin.   Hypertension    MI (myocardial infarction) (HCC)    Morbid obesity (HCC)    Neck pain 07/13/2018   NICM (nonischemic cardiomyopathy) (HCC)    a. s/p Cath x 3 - reportedly nl cors. Last cath 2019 in [place]; b. a. 03/2018 Echo: EF 25-30%, diff HK.   Persistent atrial fibrillation (HCC)    a. 03/2018 s/p DCCV; b. CHA2DS2VASc = 1-->Xarelto (later changed to warfarin); c. 05/2018 recurrent afib-->Amio initiated.   Sleep apnea    Sleep apnea     Medications:  Pharmacy technician spoke with pt who confirmed pt takes warfarin 5 mg daily and his last dose was 7/15.   Assessment: 60 y.o. male with medical history significant of dCHF, hypertension, hyperlipidemia, diet-controlled diabetes, COPD,  hypothyroidism, gout, depression, anxiety, OSA on CPAP, atrial fibrillation, PE/DVT on Coumadin, CAD, alcohol abuse, obesity, who presents with shortness of breath. Pt has a history of being subtherapeutic on warfarin and had been switched to Eliquis due to non-compliance with INR checks, however, now pt is back on warfarin. Pharmacy has been consulted for dosing/monitoring of warfarin.  Date INR Dose/Comment 7/16 1.1 7.5 mg 7/17 1.4 7.5 mg 7/18 1.8 5 mg (0 >100% meals eaten) 7/19 2.5 5 mg  7/20 2.3 5 mg 7/21 2.3 5 mg  Goal of Therapy:  INR 2-3 Monitor platelets by anticoagulation protocol: Yes   Plan:  INR therapeutic plateau 2.3>2.3.  Pt w/ CHF/lasix, allopurinol, Ceftriaxone (d4/__d), solumedrol 91>40mg  BID Continue with home dose of warfarin 5 mg daily Monitor INR daily    8/21, PharmD, [facility] Clinical Pharmacist 09/04/2020,7:44 AM

## 2020-09-04 NOTE — Progress Notes (Signed)
PROGRESS NOTE  [name] [name]  YJE:[id number] DOB: 07-15-1960 DOA: 08/30/2020 PCP: [name], DO   Brief Narrative: Past medical history of HFpEF, HTN, HLD, type II DM, COPD, hypothyroidism, Depression, anxiety, gout, OSA on CPAP, chronic A. 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   Presented with shortness of breath. 7/16 admitted for acute on chronic systolic CHF plus COVID-19 infection 7/17-18 transferred to stepdown unit for high CIWA. Currently plan is continue to treat CHF.  Assessment & Plan: Principal Problem:   Acute on chronic systolic congestive heart failure (HCC) Active Problems:   Obstructive sleep apnea   HTN (hypertension)   Personal history of DVT (deep vein thrombosis)   Chest pain   Major depressive disorder, recurrent episode, in partial remission (HCC)   PE (pulmonary thromboembolism) (HCC)   HLD (hyperlipidemia)   CAD (coronary artery disease)   Hypothyroidism   Depression   Elevated troponin   Gout   Alcohol abuse   COPD (chronic obstructive pulmonary disease) (HCC)   Atrial fibrillation, chronic (HCC)   Hypokalemia   Abdominal pain  Chronic hypoxic respiratory failure, COPD, OSA: Currently stable on home oxygen (3L typical home dosing).  - Monitor SpO2. Avoid over oxygenating as he's at risk for CO2 retention.  - CPAP qHS - Continue bronchodilators, singulair.  Acute on chronic biventricular systolic heart failure: Echocardiogram in 2/22 EF 30 to 35%. Echocardiogram 7/16 poor acoustic window-unable to calculate LV but RV systolic function is severely reduced. - Continue lasix, increased to 80mg  IV BID with resultant 4.6L UOP over past 24 hours, wt down 182.1kg > 176.8kg. Will continue this with stable Cr and widening BUN:Cr. Assessment of volume status difficult with morbid obesity.  - Continue I/O, weights - Continue metoprolol, restarted lisinopril   COVID-19 infection: +7/16. Mild without infiltrates/pneumonia.   -  Complete 5 days remdesivir (7/17 - 7/21) - Taper steroids further with durable improvement in CRP 10.7 >>> 1.7 with hyperglycemia.  - Isolation x10 days.   Steroid-induced hyperglycemia: HbA1c 6.3%.  - Started moderate SSI, could consider metformin though will not require ongoing Tx after discharge.   Left leg cellulitis: Nonpurulent.  - Continue ceftriaxone x5 days.   Abdominal pain: CT abdomen negative for any acute abnormality. Improved.  Will monitor.   Elevated troponin due to demand myocardial ischemia with known CAD, HLD:  - Continue statin, beta blocker, anticoagulation. - Outpatient risk stratification can be pursued.   Chronic A. fib: Had RVR 7/18 since resolved.  History of DVT PE. - Continue coumadin, monitoring INR daily - Continue metoprolol.    Depression and mood disorder. Acute encephalopathy metabolic Alcohol use disorder. - No longer appears to be in alcohol withdrawal. Denies frequent use.    History of gout:  - Continue allopurinol and colchicine   Osteoarthritis, chronic pain:  - Multimodal pain medications, voltaren, PT/OT, robaxin prn.   GERD. - Continue PPI.   Hypothyroidism: TSH mildly elevated but free T4 normal in setting of acute illness.  - Continue synthroid at home dose, recheck at follow up.    Thrombocytopenia: Likely due to covid. Resolved.   Hypomagnesemia:  - Supplemented  Morbid obesity: Estimated body mass index is 59.26 kg/m as calculated from the following:   Height as of this encounter: 5\' 8"  (1.727 m).   Weight as of this encounter: 176.8 kg.  DVT prophylaxis: Coumadin Code Status: Full Family Communication: None at bedside Disposition Plan:  Status is: Inpatient  Remains inpatient appropriate because:Inpatient level of care appropriate  due to severity of illness  Dispo: The patient is from: Home              Anticipated d/c is to: Home              Patient currently is not medically stable to d/c.   Difficult to  place patient No  Consultants:  None  Procedures:  None  Antimicrobials: Remdesivir, ceftriaxone   Subjective: Feels his breathing is improved and strength coming along as such he plans to go home at discharge instead of facility for rehabilitation. No chest pain. Chronic pain flared yesterday, better so far today.   Objective: Vitals:   09/04/20 0506 09/04/20 0813 09/04/20 1022 09/04/20 1131  BP: (!) 133/103 (!) 141/88  125/80  Pulse: 81 92  82  Resp: 20 17  18   Temp: 97.8 F (36.6 C) 97.9 F (36.6 C)  97.9 F (36.6 C)  TempSrc:      SpO2: 97% 99% 99% 95%  Weight: (!) 176.8 kg     Height:        Intake/Output Summary (Last 24 hours) at 09/04/2020 1315 Last data filed at 09/04/2020 1134 Gross per 24 hour  Intake 506.98 ml  Output 7550 ml  Net -7043.02 ml   Filed Weights   09/03/20 0500 09/03/20 0929 09/04/20 0506  Weight: (!) 178 kg (!) 179.5 kg (!) 176.8 kg   Gen: Morbidly obese male in no distress Pulm: Nonlabored breathing room air. Distant without definite crackles or wheezes. CV: Regular rate and rhythm. No murmur, rub, or gallop. No JVD, 1+ pitting dependent edema. GI: Abdomen soft, non-tender, non-distended, with normoactive bowel sounds.  Ext: Warm, no deformities Skin: No new rashes, lesions or ulcers on visualized skin. Neuro: Alert and oriented. No focal neurological deficits. Psych: Judgement and insight appear fair. Mood euthymic & affect congruent. Behavior is appropriate.    Data Reviewed: I have personally reviewed following labs and imaging studies  CBC: Recent Labs  Lab 08/31/20 0520 09/01/20 0648 09/02/20 0557 09/03/20 0511 09/04/20 0518  WBC 10.3 5.8 5.9 8.0 6.4  NEUTROABS  --  5.0 5.5 7.4 5.4  HGB 11.0* 10.7* 10.7* 11.2* 12.2*  HCT 34.3* 33.1* 34.1* 35.4* 38.1*  MCV 94.8 96.5 97.7 94.9 94.1  PLT 153 124* 134* 140* 166   Basic Metabolic Panel: Recent Labs  Lab 08/30/20 1203 08/31/20 0520 09/01/20 0648 09/02/20 0557  09/03/20 0511 09/04/20 0518  NA  --  137 136 136 134* 137  K  --  4.0 4.1 4.2 4.1 3.9  CL  --  92* 92* 90* 87* 85*  CO2  --  35* 35* 35* 37* 41*  GLUCOSE  --  116* 118* 223* 241* 182*  BUN  --  13 16 23* 27* 30*  CREATININE  --  0.65 0.83 0.75 0.77 0.87  CALCIUM  --  8.6* 8.6* 8.4* 8.6* 8.8*  MG 1.7 1.6* 1.6*  --  1.4*  --    GFR: Estimated Creatinine Clearance: 144.6 mL/min (by C-G formula based on SCr of 0.87 mg/dL). Liver Function Tests: Recent Labs  Lab 09/01/20 0648 09/02/20 0557 09/03/20 0511 09/04/20 0518  AST 57* 48* 29 22  ALT 40 42 34 28  ALKPHOS 80 73 68 67  BILITOT 0.5 0.5 0.4 0.5  PROT 6.7 6.8 6.4* 7.0  ALBUMIN 3.0* 2.8* 2.9* 3.2*   Recent Labs  Lab 08/30/20 1203  LIPASE 24   Recent Labs  Lab 09/01/20 0648  AMMONIA <9*  Coagulation Profile: Recent Labs  Lab 08/31/20 0520 09/01/20 0648 09/02/20 0557 09/03/20 0511 09/04/20 0518  INR 1.4* 1.8* 2.5* 2.3* 2.3*   Cardiac Enzymes: No results for input(s): CKTOTAL, CKMB, CKMBINDEX, TROPONINI in the last 168 hours. BNP (last 3 results) No results for input(s): PROBNP in the last 8760 hours. HbA1C: Recent Labs    09/04/20 0518  HGBA1C 6.3*   CBG: Recent Labs  Lab 09/03/20 1541 09/03/20 2112 09/04/20 0815 09/04/20 0932 09/04/20 1133  GLUCAP 198* 199* 167* 155* 124*   Lipid Profile: No results for input(s): CHOL, HDL, LDLCALC, TRIG, CHOLHDL, LDLDIRECT in the last 72 hours. Thyroid Function Tests: No results for input(s): TSH, T4TOTAL, FREET4, T3FREE, THYROIDAB in the last 72 hours.  Anemia Panel: Recent Labs    09/02/20 0557  FERRITIN 95   Urine analysis:    Component Value Date/Time   COLORURINE YELLOW (A) 07/02/2020 1358   APPEARANCEUR HAZY (A) 07/02/2020 1358   APPEARANCEUR Hazy (A) 12/25/2018 1442   LABSPEC 1.009 07/02/2020 1358   PHURINE 6.0 07/02/2020 1358   GLUCOSEU NEGATIVE 07/02/2020 1358   HGBUR SMALL (A) 07/02/2020 1358   BILIRUBINUR NEGATIVE 07/02/2020 1358    BILIRUBINUR Negative 12/25/2018 1442   KETONESUR NEGATIVE 07/02/2020 1358   PROTEINUR NEGATIVE 07/02/2020 1358   NITRITE NEGATIVE 07/02/2020 1358   LEUKOCYTESUR LARGE (A) 07/02/2020 1358   Recent Results (from the past 240 hour(s))  SARS CORONAVIRUS 2 (TAT 6-24 HRS) Nasopharyngeal Nasopharyngeal Swab     Status: Abnormal   Collection Time: 08/30/20 11:20 AM   Specimen: Nasopharyngeal Swab  Result Value Ref Range Status   SARS Coronavirus 2 POSITIVE (A) NEGATIVE Final    Comment: (NOTE) SARS-CoV-2 target nucleic acids are DETECTED.  The SARS-CoV-2 RNA is generally detectable in upper and lower respiratory specimens during the acute phase of infection. Positive results are indicative of the presence of SARS-CoV-2 RNA. Clinical correlation with patient history and other diagnostic information is  necessary to determine patient infection status. Positive results do not rule out bacterial infection or co-infection with other viruses.  The expected result is Negative.  Fact Sheet for Patients: HairSlick.no  Fact Sheet for Healthcare Providers: quierodirigir.com  This test is not yet approved or cleared by the [place] FDA and  has been authorized for detection and/or diagnosis of SARS-CoV-2 by FDA under an Emergency Use Authorization (EUA). This EUA will remain  in effect (meaning this test can be used) for the duration of the COVID-19 declaration under Section 564(b)(1) of the Act, 21 U. S.C. section 360bbb-3(b)(1), unless the authorization is terminated or revoked sooner.   Performed at [facility], [address], [place], [place] [address]   MRSA Next Gen by PCR, Nasal     Status: Abnormal   Collection Time: 09/01/20 12:52 AM   Specimen: Nasal Mucosa; Nasal Swab  Result Value Ref Range Status   MRSA by PCR Next Gen DETECTED (A) NOT DETECTED Final    Comment: RESULT CALLED TO, READ BACK BY AND VERIFIED  WITH: KERRI STIPANOV @ 023 09/01/20 LFD (NOTE) The GeneXpert MRSA Assay (FDA approved for NASAL specimens only), is one component of a comprehensive MRSA colonization surveillance program. It is not intended to diagnose MRSA infection nor to guide or monitor treatment for MRSA infections. Test performance is not FDA approved in patients less than 73 years old. Performed at [facility], [address], [place], [place] [address]       Radiology Studies: No results found.  Scheduled Meds:  allopurinol  100 mg Oral Daily   vitamin C  500 mg Oral Daily   Chlorhexidine Gluconate Cloth  6 each Topical Daily   colchicine  0.6 mg Oral Daily   folic acid  1 mg Oral Daily   furosemide  80 mg Intravenous Q12H   insulin aspart  0-15 Units Subcutaneous TID WC   insulin aspart  0-5 Units Subcutaneous QHS   levothyroxine  75 mcg Oral QAC breakfast   lisinopril  2.5 mg Oral Daily   LORazepam  0-4 mg Intravenous Q8H   methylPREDNISolone (SOLU-MEDROL) injection  40 mg Intravenous Q12H   metoprolol tartrate  100 mg Oral BID   montelukast  10 mg Oral QHS   multivitamin with minerals  1 tablet Oral Daily   mupirocin ointment  1 application Nasal BID   pantoprazole  40 mg Oral Q1200   thiamine  100 mg Oral Daily   Or   thiamine  100 mg Intravenous Daily   warfarin  5 mg Oral q1600   Warfarin - Pharmacist Dosing Inpatient   Does not apply q1600   zinc sulfate  220 mg Oral Daily   Continuous Infusions:  cefTRIAXone (ROCEPHIN)  IV Stopped (09/03/20 2250)     LOS: 5 days   Time spent: 25 minutes.  Tyrone Nine, MD [facility] [email or web address] 09/04/2020, 1:15 PM

## 2020-09-05 DIAGNOSIS — I509 Heart failure, unspecified: Secondary | ICD-10-CM | POA: Diagnosis not present

## 2020-09-05 LAB — GLUCOSE, CAPILLARY
Glucose-Capillary: 178 mg/dL — ABNORMAL HIGH (ref 70–99)
Glucose-Capillary: 198 mg/dL — ABNORMAL HIGH (ref 70–99)

## 2020-09-05 LAB — BASIC METABOLIC PANEL
Anion gap: 12 (ref 5–15)
BUN: 29 mg/dL — ABNORMAL HIGH (ref 6–20)
CO2: 43 mmol/L — ABNORMAL HIGH (ref 22–32)
Calcium: 8.5 mg/dL — ABNORMAL LOW (ref 8.9–10.3)
Chloride: 83 mmol/L — ABNORMAL LOW (ref 98–111)
Creatinine, Ser: 0.67 mg/dL (ref 0.61–1.24)
GFR, Estimated: >60 mL/min (ref >60)
Glucose, Bld: 185 mg/dL — ABNORMAL HIGH (ref 70–99)
Potassium: 3.9 mmol/L (ref 3.5–5.1)
Sodium: 138 mmol/L (ref 135–145)

## 2020-09-05 LAB — PROTIME-INR
INR: 2.5 — ABNORMAL HIGH (ref 0.8–1.2)
Prothrombin Time: 27.3 seconds — ABNORMAL HIGH (ref 11.4–15.2)

## 2020-09-05 MED ORDER — GUAIFENESIN-DM 100-10 MG/5ML PO SYRP: 5.0000 mL | ORAL_SOLUTION | ORAL | 0 refills | Status: DC | PRN

## 2020-09-05 MED ORDER — TORSEMIDE 40 MG PO TABS: 80.0000 mg | ORAL_TABLET | Freq: Two times a day (BID) | ORAL | 0 refills | Status: DC

## 2020-09-05 MED ORDER — OXYCODONE-ACETAMINOPHEN 7.5-325 MG PO TABS: 1.0000 | ORAL_TABLET | Freq: Three times a day (TID) | ORAL | 0 refills | Status: DC | PRN

## 2020-09-05 NOTE — Clinical Social Work Note (Cosign Needed)
[facility] will start care on 7/26.  [place], [place] [phone]

## 2020-09-05 NOTE — Progress Notes (Signed)
ANTICOAGULATION CONSULT NOTE  Pharmacy Consult for Warfarin Indication: atrial fibrillation and pulmonary embolus  No Known Allergies  Patient Measurements: Height: 5\' 8"  (172.7 cm) Weight: (!) 176.8 kg (389 lb 12.4 oz) IBW/kg (Calculated) : 68.4  Vital Signs: Temp: 98.4 F (36.9 C) (07/22 0442) BP: 137/85 (07/22 0442) Pulse Rate: 67 (07/22 0442)  Labs: Recent Labs    09/03/20 0511 09/04/20 0518 09/05/20 0508  HGB 11.2* 12.2*  --   HCT 35.4* 38.1*  --   PLT 140* 166  --   LABPROT 25.2* 24.9* 27.3*  INR 2.3* 2.3* 2.5*  CREATININE 0.77 0.87 0.67     Estimated Creatinine Clearance: 157.2 mL/min (by C-G formula based on SCr of 0.67 mg/dL).   Medical History: Past Medical History:  Diagnosis Date   Allergy    Anxiety    Arthritis    Asthma    Brain damage    Chronic pain of both knees 07/13/2018   Clotting disorder (HCC)    COPD (chronic obstructive pulmonary disease) (HCC)    Depression    GERD (gastroesophageal reflux disease)    HFrEF (heart failure with reduced ejection fraction) (HCC)    a. 03/2018 Echo: EF 25-30%, diff HK. Mod LAE.   History of DVT (deep vein thrombosis)    History of pulmonary embolism    a. Chronic coumadin.   Hypertension    MI (myocardial infarction) (HCC)    Morbid obesity (HCC)    Neck pain 07/13/2018   NICM (nonischemic cardiomyopathy) (HCC)    a. s/p Cath x 3 - reportedly nl cors. Last cath 2019 in [place]; b. a. 03/2018 Echo: EF 25-30%, diff HK.   Persistent atrial fibrillation (HCC)    a. 03/2018 s/p DCCV; b. CHA2DS2VASc = 1-->Xarelto (later changed to warfarin); c. 05/2018 recurrent afib-->Amio initiated.   Sleep apnea    Sleep apnea     Medications:  Pharmacy technician spoke with pt who confirmed pt takes warfarin 5 mg daily and his last dose was 7/15.   Assessment: 60 y.o. male with medical history significant of dCHF, hypertension, hyperlipidemia, diet-controlled diabetes, COPD, hypothyroidism, gout, depression, anxiety, OSA  on CPAP, atrial fibrillation, PE/DVT on Coumadin, CAD, alcohol abuse, obesity, who presents with shortness of breath. Pt has a history of being subtherapeutic on warfarin and had been switched to Eliquis due to non-compliance with INR checks, however, now pt is back on warfarin. Pharmacy has been consulted for dosing/monitoring of warfarin.  Date INR Dose/Comment 7/16 1.1 7.5 mg 7/17 1.4 7.5 mg 7/18 1.8 5 mg (0 >100% meals eaten) 7/19 2.5 5 mg  7/20 2.3 5 mg 7/21 2.3 5 mg 7/22 2.5 5 mg  Goal of Therapy:  INR 2-3 Monitor platelets by anticoagulation protocol: Yes   Plan:  INR therapeutic 2.3>2.5. Completing ceftriaxone and remdesivir today. Pt w/ CHF/lasix, allopurinol, Ceftriaxone (d5/5d), solumedrol 40mg  BID Continue with home dose of warfarin 5 mg daily Monitor INR daily   8/22, PharmD, [facility] Clinical Pharmacist 09/05/2020,7:34 AM
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
## 2020-09-05 NOTE — Discharge Summary (Signed)
Physician Discharge Summary  Niccolo Burggraf QZR:[id number] DOB: Jun 17, 1960 DOA: 08/30/2020  PCP: [name], DO  Admit date: 08/30/2020 Discharge date: 09/05/2020  Admitted From: Home Disposition: Home (declined SNF repeatedly)   Recommendations for Outpatient Follow-up:  Follow up with PCP in 1-2 weeks  Home Health: Maximized Equipment/Devices: 3L O2 Discharge Condition: Stable CODE STATUS: Full Diet recommendation: Heart healthy, carb-modified  Brief/Interim Summary: [name] [name] is a 60 y.o. male with a history of HFpEF, HTN, HLD, type II DM, COPD, lymphedema, hypothyroidism, depression, anxiety, gout, OSA on CPAP, chronic A. fib, PE DVT on Coumadin, morbid obesity who presented to dyspnea found to have acute CHF and covid-19 infection for which he was treated with steroids, remdesivir and diuresis with improvement. Between 7/17 and 7/18 he was transferred to the ICU for more intensive CIWA but has no evidence of withdrawal at time of discharge. His hypoxia has returned to baseline, no longer has dyspnea, and wants to discharge home for his birthday. He has consistently declined SNF placement despite significant functional deficits and remains a very high readmission risk.   Discharge Diagnoses:  Principal Problem:   Acute on chronic systolic congestive heart failure (HCC) Active Problems:   Obstructive sleep apnea   HTN (hypertension)   Personal history of DVT (deep vein thrombosis)   Chest pain   Major depressive disorder, recurrent episode, in partial remission (HCC)   PE (pulmonary thromboembolism) (HCC)   HLD (hyperlipidemia)   CAD (coronary artery disease)   Hypothyroidism   Depression   Elevated troponin   Gout   Alcohol abuse   COPD (chronic obstructive pulmonary disease) (HCC)   Atrial fibrillation, chronic (HCC)   Hypokalemia   Abdominal pain  Chronic hypoxic respiratory failure, COPD, OSA: Currently stable on home oxygen (3L  typical home dosing). Avoid over oxygenating as he's at risk for CO2 retention. - CPAP qHS - Continue bronchodilators, singulair.   Acute on chronic biventricular systolic heart failure: Echocardiogram in 2/22 EF 30 to 35%. Echocardiogram 7/16 poor acoustic window-unable to calculate LV but RV systolic function is severely reduced. - Continue home torsemide (pt didn't know he was on this...) at discharge. WE have used equivalent IV lasix dosing with good diuretic response and stable metabolic parameters. Wt down 182.1kg > 176.8kg. Daily weights discussed. Needs cardiology follow up. - Continue metoprolol, restarted lisinopril   COVID-19 infection: +7/16. Mild without infiltrates/pneumonia.   - Completed 5 days remdesivir (7/17 - 7/21) - Tapered steroids further with durable improvement in CRP 10.7 >>> 1.7 with hyperglycemia. - Isolation x10 days.    Steroid-induced hyperglycemia: HbA1c 6.3%.  - Started moderate SSI, could consider metformin though will not require ongoing Tx after discharge. Will defer to PCP.   Left leg cellulitis: Nonpurulent. - Completed ceftriaxone x5 days.   Abdominal pain: CT abdomen negative for any acute abnormality. Improved.    Elevated troponin due to demand myocardial ischemia with known CAD, HLD:  - Continue statin, beta blocker, anticoagulation. - Outpatient risk stratification can be pursued.   Chronic A. fib: Had RVR 7/18 since resolved.  History of DVT PE. - Continue coumadin, INR stable on home dosing. - Continue metoprolol.   Depression and mood disorder. Acute encephalopathy metabolic Alcohol use disorder. - No longer appears to be in alcohol withdrawal. Denies frequent use. Cessation counseling provided.   History of gout: - Continue allopurinol and colchicine   Osteoarthritis, chronic pain: - Multimodal pain medications, voltaren, PT/OT, robaxin prn. - After PDMP review, percocet #15,  refill zero prescribed at discharge.   GERD. -  Continue PPI.   Hypothyroidism: TSH mildly elevated but free T4 normal in setting of acute illness. - Continue synthroid at home dose, recheck at follow up.   Thrombocytopenia: Likely due to covid. Resolved.   Hypomagnesemia:  - Supplemented   Morbid obesity: Estimated body mass index is 59.26 kg/m  Discharge Instructions Discharge Instructions     Diet - low sodium heart healthy   Complete by: As directed    No wound care   Complete by: As directed       Allergies as of 09/05/2020   No Known Allergies      Medication List     STOP taking these medications    acetaminophen 325 MG tablet Commonly known as: TYLENOL   apixaban 5 MG Tabs tablet Commonly known as: ELIQUIS   metoprolol tartrate 100 MG tablet Commonly known as: LOPRESSOR       TAKE these medications    albuterol 108 (90 Base) MCG/ACT inhaler Commonly known as: VENTOLIN HFA Inhale 2 puffs into the lungs every 4 (four) hours as needed for wheezing or shortness of breath.   allopurinol 100 MG tablet Commonly known as: ZYLOPRIM Take 1 tablet (100 mg total) by mouth daily.   aspirin EC 81 MG tablet Take 81 mg by mouth daily. Swallow whole.   atorvastatin 40 MG tablet Commonly known as: LIPITOR TAKE 1 TABLET BY MOUTH ONCE DAILY   colchicine 0.6 MG tablet Take 1 tablet (0.6 mg total) by mouth daily.   diclofenac Sodium 1 % Gel Commonly known as: VOLTAREN Apply topically.   DULoxetine 20 MG capsule Commonly known as: CYMBALTA Take 1 capsule (20 mg total) by mouth daily.   guaiFENesin-dextromethorphan 100-10 MG/5ML syrup Commonly known as: ROBITUSSIN DM Take 5 mLs by mouth every 4 (four) hours as needed for cough.   levothyroxine 75 MCG tablet Commonly known as: SYNTHROID Take 1 tablet (75 mcg total) by mouth daily before breakfast.   lisinopril 2.5 MG tablet Commonly known as: ZESTRIL Take 2.5 mg by mouth daily.   meclizine 25 MG tablet Commonly known as: ANTIVERT Take 0.5-1  tablets (12.5-25 mg total) by mouth 3 (three) times daily as needed for dizziness. What changed: how much to take   methocarbamol 750 MG tablet Commonly known as: ROBAXIN Take 750 mg by mouth 3 (three) times daily.   metoprolol succinate 100 MG 24 hr tablet Commonly known as: TOPROL-XL Take 100 mg by mouth daily.   montelukast 10 MG tablet Commonly known as: SINGULAIR Take 1 tablet (10 mg total) by mouth at bedtime.   multivitamin with minerals Tabs tablet Take 1 tablet by mouth daily.   naphazoline-glycerin 0.012-0.2 % Soln Commonly known as: CLEAR EYES REDNESS Place 1-2 drops into both eyes 4 (four) times daily as needed for eye irritation.   nitroGLYCERIN 0.4 MG SL tablet Commonly known as: NITROSTAT Place 1 tablet under tongue every 5 minutes as needed for chest pain. (No more than 3 doses within 15 minutes)   oxyCODONE-acetaminophen 7.5-325 MG tablet Commonly known as: PERCOCET Take 1 tablet by mouth every 8 (eight) hours as needed for up to 5 days for severe pain.   potassium chloride SA 20 MEQ tablet Commonly known as: KLOR-CON Take 1 tablet (20 mEq total) by mouth daily.   SUMAtriptan 50 MG tablet Commonly known as: IMITREX Take 50 mg by mouth every 2 (two) hours as needed for migraine.   Torsemide 40 MG Tabs  Take 80 mg by mouth 2 (two) times daily. What changed: medication strength   traZODone 150 MG tablet Commonly known as: DESYREL Take 1 tablet (150 mg total) by mouth at bedtime.   warfarin 5 MG tablet Commonly known as: COUMADIN Take 5 mg by mouth daily.        Follow-up Information     [name], DO Follow up.   Specialty: Family Medicine Contact information: [address] [place] [address] [phone]         [name], MD .   Specialty: Cardiology Contact information: [address] [place] [address] [phone]                No Known  Allergies  Consultations: WOC  Procedures/Studies: CT CHEST W CONTRAST  Result Date: 08/31/2020 CLINICAL DATA:  60 year old male with clinical suspicion for pulmonary embolism. EXAM: CT CHEST WITH CONTRAST TECHNIQUE: Multidetector CT imaging of the chest was performed during intravenous contrast administration. CONTRAST:  OMNIPAQUE IOHEXOL 350 MG/ML SOLN COMPARISON:  Chest CT 06/16/2020. FINDINGS: Cardiovascular: Suboptimal contrast bolus. With these limitations in mind, there are no central, lobar or segmental sized filling defect to indicate clinically relevant pulmonary embolism. Smaller subsegmental sized pulmonary emboli cannot be entirely excluded. Heart size is enlarged with left ventricular and left atrial dilatation. There is no significant pericardial fluid, thickening or pericardial calcification. There is aortic atherosclerosis, as well as atherosclerosis of the great vessels of the mediastinum and the coronary arteries, including calcified atherosclerotic plaque in the left main, left anterior descending, left circumflex and right coronary arteries. Calcifications of the aortic valve. Mediastinum/Nodes: No pathologically enlarged mediastinal or hilar lymph nodes. Esophagus is unremarkable in appearance. No axillary lymphadenopathy. Lungs/Pleura: Scarring in the periphery of the left lower lobe, similar to the prior study. No acute consolidative airspace disease. No pleural effusions. No suspicious appearing pulmonary nodules or masses are noted. Upper Abdomen: Aortic atherosclerosis. Severe diffuse low attenuation throughout the visualized hepatic parenchyma, indicative of severe hepatic steatosis. Musculoskeletal: There are no aggressive appearing lytic or blastic lesions noted in the visualized portions of the skeleton. IMPRESSION: 1. Limited study demonstrating no evidence of clinically relevant central, lobar or segmental sized pulmonary embolism. 2. No acute findings in the thorax to  account for the patient's symptoms. 3. Aortic atherosclerosis, in addition to left main and 3 vessel coronary artery disease. Please note that although the presence of coronary artery calcium documents the presence of coronary artery disease, the severity of this disease and any potential stenosis cannot be assessed on this non-gated CT examination. Assessment for potential risk factor modification, dietary therapy or pharmacologic therapy may be warranted, if clinically indicated. 4. There are calcifications of the aortic valve. Echocardiographic correlation for evaluation of potential valvular dysfunction may be warranted if clinically indicated. 5. Severe hepatic steatosis. Aortic Atherosclerosis (ICD10-I70.0). Electronically Signed   By: Trudie Reed M.D.   On: 08/31/2020 12:20   CT ABDOMEN PELVIS W CONTRAST  Result Date: 08/30/2020 CLINICAL DATA:  Abdominal pain EXAM: CT ABDOMEN AND PELVIS WITH CONTRAST TECHNIQUE: Multidetector CT imaging of the abdomen and pelvis was performed using the standard protocol following bolus administration of intravenous contrast. CONTRAST:  OMNIPAQUE IOHEXOL 300 MG/ML  SOLN COMPARISON:  CT 06/16/2020 FINDINGS: Lower chest: No pleural or pericardial effusion. Visualized lung bases clear. Hepatobiliary: Fatty liver without discrete lesion or biliary ductal dilatation. No calcified stones in the gallbladder. Pancreas: Unremarkable. No pancreatic ductal dilatation or surrounding inflammatory  changes. Spleen: Normal in size without focal abnormality. Adrenals/Urinary Tract: Adrenal glands unremarkable. No renal mass or hydronephrosis. Limited renal excretion on delayed scans. The urinary bladder is incompletely distended. Stomach/Bowel: Stomach is physiologically distended by ingested fluid. The small bowel is nondilated. Normal appendix. The colon is nondilated with scattered diverticula, no focal adjacent inflammatory change. Vascular/Lymphatic: Scattered aortoiliac  calcified plaque without aneurysm. Portal vein patent. Single 1.1 cm enlarged left common iliac chain lymph node, stable. Prominent bilateral inguinal lymph nodes as before. Reproductive: Normal-sized prostate with central coarse calcifications. Other: Bilateral pelvic phleboliths.  No ascites.  No free air. Musculoskeletal: Obesity. Multilevel spondylitic changes in the lower thoracic and lumbar spine. Advanced right hip DJD. No fracture or worrisome bone lesion. IMPRESSION: 1. No acute findings. 2. Scattered colonic diverticula. 3. Fatty liver. 4.  Aortic Atherosclerosis (ICD10-170.0). Electronically Signed   By: Corlis Leak M.D.   On: 08/30/2020 11:47   US Venous Img Lower Bilateral (DVT)  Result Date: 08/31/2020 CLINICAL DATA:  Edema, pain, history of PE EXAM: BILATERAL LOWER EXTREMITY VENOUS DOPPLER ULTRASOUND TECHNIQUE: Gray-scale sonography with compression, as well as color and duplex ultrasound, were performed to evaluate the deep venous system(s) from the level of the common femoral vein through the popliteal and proximal calf veins. COMPARISON:  01/08/2020 and previous FINDINGS: VENOUS Normal compressibility of the common femoral, superficial femoral, and popliteal veins, as well as the visualized calf veins. Visualized portions of profunda femoral vein and great saphenous vein unremarkable. No filling defects to suggest DVT on grayscale or color Doppler imaging. Doppler waveforms show normal direction of venous flow, normal respiratory phasicity and response to augmentation. OTHER Right calf edema.  Left calf edema. Limitations: Technologist describes technically difficult study secondary to body habitus BMI 61. IMPRESSION: 1. Negative Electronically Signed   By: Corlis Leak M.D.   On: 08/31/2020 14:19   DG Chest Portable 1 View  Result Date: 08/30/2020 CLINICAL DATA:  Per Triage: Pt arrives via EMS from home after having increased SHOB- per EMS pt was 95% on his chronic 3L [place]- pt was also vomiting  on the ems stretcher Patient alert and oriented. Patient able to move with help. Patient is naus.*comment was truncated*SHOB EXAM: PORTABLE CHEST 1 VIEW COMPARISON:  08/30/2018 FINDINGS: Stable cardiomegaly. There is increase in vascular congestion pattern. Probable mild edema superimposed on the vascular congestion. No pneumothorax. No focal consolidation. IMPRESSION: Cardiomegaly and increased pulmonary edema pattern. Electronically Signed   By: Genevive Bi M.D.   On: 08/30/2020 09:19   ECHOCARDIOGRAM COMPLETE  Result Date: 08/31/2020    ECHOCARDIOGRAM REPORT   Patient Name:   AQUILLA VOILES Hartsough Date of Exam: 08/30/2020 Medical Rec #:  [id number]              Height:       68.0 in Accession #:    [id number]             Weight:       350.0 lb Date of Birth:  24-May-1960              BSA:          2.592 m Patient Age:    59 years               BP:           127/92 mmHg Patient Gender: M                      HR:  115 bpm. Exam Location:  [facility] Procedure: 2D Echo and Intracardiac Opacification Agent Indications:     CHF I50.21  History:         Patient has prior history of Echocardiogram examinations, most                  recent 04/07/2020.  Sonographer:     [place]ton Mamikeshia Johnson RDCS Referring Phys:  [id number] Brien FewXILIN NIU Diagnosing Phys: Chilton Siiffany [place] MD  Sonographer Comments: Technically challenging study due to limited acoustic windows, Technically difficult study due to poor echo windows, no apical window, no parasternal window, no subcostal window and patient is morbidly obese. Image acquisition challenging due to patient body habitus. IMPRESSIONS  1. Left ventricular ejection fraction, by estimation, is unable to assess%. The left ventricle has severely decreased function. The left ventricle has no regional wall motion abnormalities. The left ventricular internal cavity size was moderately dilated. There is unable to assess left ventricular hypertrophy. Left ventricular diastolic function could not be  evaluated.  2. Right ventricular systolic function is severely reduced. The right ventricular size is not well visualized.  3. The mitral valve was not well visualized. unable to assess mitral valve regurgitation. unable to assess mitral stenosis.  4. The aortic valve was not well visualized. Aortic valve regurgitation unable to assess. unable to assess.  5. Pulmonic valve regurgitation unable to assess. unable to assess pulmonic stenosis. FINDINGS  Left Ventricle: Left ventricular ejection fraction, by estimation, is unable to assess%. The left ventricle has severely decreased function. The left ventricle has no regional wall motion abnormalities. Definity contrast agent was given IV to delineate the left ventricular endocardial borders. The left ventricular internal cavity size was moderately dilated. There is unable to assess left ventricular hypertrophy. Left ventricular diastolic function could not be evaluated. Right Ventricle: The right ventricular size is not well visualized. No increase in right ventricular wall thickness. Right ventricular systolic function is severely reduced. Left Atrium: Left atrial size was not well visualized. Right Atrium: Right atrial size was not well visualized. Pericardium: The pericardium was not well visualized. Mitral Valve: The mitral valve was not well visualized. Unable to assess mitral valve regurgitation. Unable to assess mitral valve stenosis. Tricuspid Valve: The tricuspid valve is not well visualized. Tricuspid valve regurgitation is trivial. No evidence of tricuspid stenosis. Aortic Valve: The aortic valve was not well visualized. Aortic valve regurgitation unable to assess. Unable to assess. Aortic valve peak gradient measures 5.6 mmHg. Pulmonic Valve: The pulmonic valve was not well visualized. Pulmonic valve regurgitation unable to assess. Unable to assess pulmonic stenosis. Aorta: Aortic root could not be assessed. Venous: The pulmonary veins were not well  visualized. The inferior vena cava was not well visualized. IAS/Shunts: The interatrial septum was not well visualized. Additional Comments: Image quality is very poor. Inadequate visualization of the valves and endocardium despite the use of Defnity conrast. Both right and left ventricular systolic function appears to be significantly imparied. Visualization is not adequate to quantify systolic function. Recommend cardiac MRI or TEE for better visualization if indicated.  AORTIC VALVE AV Vmax:      118.00 cm/s AV Peak Grad: 5.6 mmHg MITRAL VALVE               TRICUSPID VALVE MV Area (PHT): 3.81 cm    TR Peak grad:   12.8 mmHg MV Decel Time: 199 msec    TR Vmax:        179.00 cm/s MV E velocity: 86.10 cm/s Tiffany  Duke Salvia MD Electronically signed by Chilton Si MD Signature Date/Time: 08/31/2020/12:49:59 PM    Final      Subjective: Feels his breathing is at baseline. Wants to go home to enjoy his birthday. Every day I've seen him he says he will be fine going home and not SNF. His leg swelling is less than it has been in a long time per him.  Discharge Exam: Vitals:   09/05/20 0442 09/05/20 0831  BP: 137/85 108/73  Pulse: 67 90  Resp: 14 19  Temp: 98.4 F (36.9 C) 98.6 F (37 C)  SpO2: 100% 92%   General: Pt is alert, awake, not in acute distress, morbidly obese Cardiovascular: RRR, S1/S2 +, no rubs, no gallops Respiratory: CTA bilaterally, no wheezing, no crackles Abdominal: Soft, NT, ND, bowel sounds + Extremities: Stable lymphedema, no cyanosis  Labs: BNP (last 3 results) Recent Labs    06/09/20 0834 07/02/20 1433 08/30/20 0819  BNP 372.0* 88.2 442.3*   Basic Metabolic Panel: Recent Labs  Lab 08/30/20 1203 08/31/20 0520 09/01/20 0648 09/02/20 0557 09/03/20 0511 09/04/20 0518 09/05/20 0508  NA  --  137 136 136 134* 137 138  K  --  4.0 4.1 4.2 4.1 3.9 3.9  CL  --  92* 92* 90* 87* 85* 83*  CO2  --  35* 35* 35* 37* 41* 43*  GLUCOSE  --  116* 118* 223* 241* 182*  185*  BUN  --  13 16 23* 27* 30* 29*  CREATININE  --  0.65 0.83 0.75 0.77 0.87 0.67  CALCIUM  --  8.6* 8.6* 8.4* 8.6* 8.8* 8.5*  MG 1.7 1.6* 1.6*  --  1.4*  --   --    Liver Function Tests: Recent Labs  Lab 09/01/20 0648 09/02/20 0557 09/03/20 0511 09/04/20 0518  AST 57* 48* 29 22  ALT 40 42 34 28  ALKPHOS 80 73 68 67  BILITOT 0.5 0.5 0.4 0.5  PROT 6.7 6.8 6.4* 7.0  ALBUMIN 3.0* 2.8* 2.9* 3.2*   Recent Labs  Lab 08/30/20 1203  LIPASE 24   Recent Labs  Lab 09/01/20 0648  AMMONIA <9*   CBC: Recent Labs  Lab 08/31/20 0520 09/01/20 0648 09/02/20 0557 09/03/20 0511 09/04/20 0518  WBC 10.3 5.8 5.9 8.0 6.4  NEUTROABS  --  5.0 5.5 7.4 5.4  HGB 11.0* 10.7* 10.7* 11.2* 12.2*  HCT 34.3* 33.1* 34.1* 35.4* 38.1*  MCV 94.8 96.5 97.7 94.9 94.1  PLT 153 124* 134* 140* 166   Cardiac Enzymes: No results for input(s): CKTOTAL, CKMB, CKMBINDEX, TROPONINI in the last 168 hours. BNP: Invalid input(s): POCBNP CBG: Recent Labs  Lab 09/03/20 2112 09/04/20 0815 09/04/20 0932 09/04/20 1133 09/04/20 1639  GLUCAP 199* 167* 155* 124* 248*   D-Dimer No results for input(s): DDIMER in the last 72 hours. Hgb A1c Recent Labs    09/04/20 0518  HGBA1C 6.3*   Lipid Profile No results for input(s): CHOL, HDL, LDLCALC, TRIG, CHOLHDL, LDLDIRECT in the last 72 hours. Thyroid function studies No results for input(s): TSH, T4TOTAL, T3FREE, THYROIDAB in the last 72 hours.  Invalid input(s): FREET3 Anemia work up No results for input(s): VITAMINB12, FOLATE, FERRITIN, TIBC, IRON, RETICCTPCT in the last 72 hours. Urinalysis    Component Value Date/Time   COLORURINE YELLOW (A) 07/02/2020 1358   APPEARANCEUR HAZY (A) 07/02/2020 1358   APPEARANCEUR Hazy (A) 12/25/2018 1442   LABSPEC 1.009 07/02/2020 1358   PHURINE 6.0 07/02/2020 1358   GLUCOSEU NEGATIVE 07/02/2020 1358  HGBUR SMALL (A) 07/02/2020 1358   BILIRUBINUR NEGATIVE 07/02/2020 1358   BILIRUBINUR Negative 12/25/2018 1442    KETONESUR NEGATIVE 07/02/2020 1358   PROTEINUR NEGATIVE 07/02/2020 1358   NITRITE NEGATIVE 07/02/2020 1358   LEUKOCYTESUR LARGE (A) 07/02/2020 1358    Microbiology Recent Results (from the past 240 hour(s))  SARS CORONAVIRUS 2 (TAT 6-24 HRS) Nasopharyngeal Nasopharyngeal Swab     Status: Abnormal   Collection Time: 08/30/20 11:20 AM   Specimen: Nasopharyngeal Swab  Result Value Ref Range Status   SARS Coronavirus 2 POSITIVE (A) NEGATIVE Final    Comment: (NOTE) SARS-CoV-2 target nucleic acids are DETECTED.  The SARS-CoV-2 RNA is generally detectable in upper and lower respiratory specimens during the acute phase of infection. Positive results are indicative of the presence of SARS-CoV-2 RNA. Clinical correlation with patient history and other diagnostic information is  necessary to determine patient infection status. Positive results do not rule out bacterial infection or co-infection with other viruses.  The expected result is Negative.  Fact Sheet for Patients: HairSlick.no  Fact Sheet for Healthcare Providers: quierodirigir.com  This test is not yet approved or cleared by the [place] FDA and  has been authorized for detection and/or diagnosis of SARS-CoV-2 by FDA under an Emergency Use Authorization (EUA). This EUA will remain  in effect (meaning this test can be used) for the duration of the COVID-19 declaration under Section 564(b)(1) of the Act, 21 U. S.C. section 360bbb-3(b)(1), unless the authorization is terminated or revoked sooner.   Performed at [facility], [address], [place], [place] [address]   MRSA Next Gen by PCR, Nasal     Status: Abnormal   Collection Time: 09/01/20 12:52 AM   Specimen: Nasal Mucosa; Nasal Swab  Result Value Ref Range Status   MRSA by PCR Next Gen DETECTED (A) NOT DETECTED Final    Comment: RESULT CALLED TO, READ BACK BY AND VERIFIED WITH: KERRI STIPANOV @ 023  09/01/20 LFD (NOTE) The GeneXpert MRSA Assay (FDA approved for NASAL specimens only), is one component of a comprehensive MRSA colonization surveillance program. It is not intended to diagnose MRSA infection nor to guide or monitor treatment for MRSA infections. Test performance is not FDA approved in patients less than 26 years old. Performed at [facility], [address], [place], [place] [address]     Time coordinating discharge: Approximately 40 minutes  Tyrone Nine, MD  [facility] 09/05/2020, 10:08 AM

## 2020-09-06 DIAGNOSIS — I509 Heart failure, unspecified: Secondary | ICD-10-CM | POA: Diagnosis not present

## 2020-09-07 ENCOUNTER — Other Ambulatory Visit: Payer: Self-pay

## 2020-09-07 ENCOUNTER — Inpatient Hospital Stay
Admission: EM | Admit: 2020-09-07 | Discharge: 2020-09-13 | DRG: 291 | Disposition: A | Discharge status: Home Health | Payer: Medicaid Other | Attending: Internal Medicine | Admitting: Internal Medicine

## 2020-09-07 ENCOUNTER — Emergency Department: Payer: Medicaid Other

## 2020-09-07 DIAGNOSIS — Z86711 Personal history of pulmonary embolism: Secondary | ICD-10-CM

## 2020-09-07 DIAGNOSIS — E876 Hypokalemia: Secondary | ICD-10-CM

## 2020-09-07 DIAGNOSIS — M109 Gout, unspecified: Secondary | ICD-10-CM | POA: Diagnosis present

## 2020-09-07 DIAGNOSIS — I890 Lymphedema, not elsewhere classified: Secondary | ICD-10-CM | POA: Diagnosis present

## 2020-09-07 DIAGNOSIS — E785 Hyperlipidemia, unspecified: Secondary | ICD-10-CM | POA: Diagnosis present

## 2020-09-07 DIAGNOSIS — R0789 Other chest pain: Secondary | ICD-10-CM | POA: Diagnosis not present

## 2020-09-07 DIAGNOSIS — Z9981 Dependence on supplemental oxygen: Secondary | ICD-10-CM

## 2020-09-07 DIAGNOSIS — I4891 Unspecified atrial fibrillation: Principal | ICD-10-CM

## 2020-09-07 DIAGNOSIS — I5082 Biventricular heart failure: Secondary | ICD-10-CM | POA: Diagnosis present

## 2020-09-07 DIAGNOSIS — D6959 Other secondary thrombocytopenia: Secondary | ICD-10-CM | POA: Diagnosis present

## 2020-09-07 DIAGNOSIS — I248 Other forms of acute ischemic heart disease: Secondary | ICD-10-CM | POA: Diagnosis present

## 2020-09-07 DIAGNOSIS — I509 Heart failure, unspecified: Secondary | ICD-10-CM | POA: Diagnosis not present

## 2020-09-07 DIAGNOSIS — I2510 Atherosclerotic heart disease of native coronary artery without angina pectoris: Secondary | ICD-10-CM | POA: Diagnosis present

## 2020-09-07 DIAGNOSIS — I5020 Unspecified systolic (congestive) heart failure: Secondary | ICD-10-CM | POA: Diagnosis not present

## 2020-09-07 DIAGNOSIS — J441 Chronic obstructive pulmonary disease with (acute) exacerbation: Secondary | ICD-10-CM | POA: Diagnosis present

## 2020-09-07 DIAGNOSIS — I5023 Acute on chronic systolic (congestive) heart failure: Secondary | ICD-10-CM

## 2020-09-07 DIAGNOSIS — F32A Depression, unspecified: Secondary | ICD-10-CM | POA: Diagnosis present

## 2020-09-07 DIAGNOSIS — Z8249 Family history of ischemic heart disease and other diseases of the circulatory system: Secondary | ICD-10-CM

## 2020-09-07 DIAGNOSIS — E119 Type 2 diabetes mellitus without complications: Secondary | ICD-10-CM | POA: Diagnosis present

## 2020-09-07 DIAGNOSIS — Z5181 Encounter for therapeutic drug level monitoring: Secondary | ICD-10-CM

## 2020-09-07 DIAGNOSIS — I252 Old myocardial infarction: Secondary | ICD-10-CM

## 2020-09-07 DIAGNOSIS — R001 Bradycardia, unspecified: Secondary | ICD-10-CM | POA: Diagnosis present

## 2020-09-07 DIAGNOSIS — J449 Chronic obstructive pulmonary disease, unspecified: Secondary | ICD-10-CM

## 2020-09-07 DIAGNOSIS — R000 Tachycardia, unspecified: Secondary | ICD-10-CM | POA: Diagnosis not present

## 2020-09-07 DIAGNOSIS — Z79890 Hormone replacement therapy (postmenopausal): Secondary | ICD-10-CM

## 2020-09-07 DIAGNOSIS — M779 Enthesopathy, unspecified: Secondary | ICD-10-CM | POA: Diagnosis present

## 2020-09-07 DIAGNOSIS — Z7982 Long term (current) use of aspirin: Secondary | ICD-10-CM

## 2020-09-07 DIAGNOSIS — Z9114 Patient's other noncompliance with medication regimen: Secondary | ICD-10-CM

## 2020-09-07 DIAGNOSIS — J9611 Chronic respiratory failure with hypoxia: Secondary | ICD-10-CM | POA: Diagnosis present

## 2020-09-07 DIAGNOSIS — Z7901 Long term (current) use of anticoagulants: Secondary | ICD-10-CM

## 2020-09-07 DIAGNOSIS — I517 Cardiomegaly: Secondary | ICD-10-CM | POA: Diagnosis not present

## 2020-09-07 DIAGNOSIS — J432 Centrilobular emphysema: Secondary | ICD-10-CM

## 2020-09-07 DIAGNOSIS — R079 Chest pain, unspecified: Secondary | ICD-10-CM | POA: Diagnosis not present

## 2020-09-07 DIAGNOSIS — Z6841 Body Mass Index (BMI) 40.0 and over, adult: Secondary | ICD-10-CM

## 2020-09-07 DIAGNOSIS — Z634 Disappearance and death of family member: Secondary | ICD-10-CM

## 2020-09-07 DIAGNOSIS — Z8616 Personal history of COVID-19: Secondary | ICD-10-CM

## 2020-09-07 DIAGNOSIS — R0902 Hypoxemia: Secondary | ICD-10-CM | POA: Diagnosis not present

## 2020-09-07 DIAGNOSIS — F1010 Alcohol abuse, uncomplicated: Secondary | ICD-10-CM | POA: Diagnosis present

## 2020-09-07 DIAGNOSIS — I428 Other cardiomyopathies: Secondary | ICD-10-CM | POA: Diagnosis present

## 2020-09-07 DIAGNOSIS — Z79899 Other long term (current) drug therapy: Secondary | ICD-10-CM

## 2020-09-07 DIAGNOSIS — J3089 Other allergic rhinitis: Secondary | ICD-10-CM

## 2020-09-07 DIAGNOSIS — M1990 Unspecified osteoarthritis, unspecified site: Secondary | ICD-10-CM | POA: Diagnosis present

## 2020-09-07 DIAGNOSIS — N179 Acute kidney failure, unspecified: Secondary | ICD-10-CM | POA: Diagnosis present

## 2020-09-07 DIAGNOSIS — I4821 Permanent atrial fibrillation: Secondary | ICD-10-CM | POA: Diagnosis present

## 2020-09-07 DIAGNOSIS — F419 Anxiety disorder, unspecified: Secondary | ICD-10-CM | POA: Diagnosis present

## 2020-09-07 DIAGNOSIS — G8929 Other chronic pain: Secondary | ICD-10-CM | POA: Diagnosis present

## 2020-09-07 DIAGNOSIS — F3341 Major depressive disorder, recurrent, in partial remission: Secondary | ICD-10-CM

## 2020-09-07 DIAGNOSIS — L03115 Cellulitis of right lower limb: Secondary | ICD-10-CM | POA: Diagnosis present

## 2020-09-07 DIAGNOSIS — I110 Hypertensive heart disease with heart failure: Principal | ICD-10-CM | POA: Diagnosis present

## 2020-09-07 DIAGNOSIS — L03116 Cellulitis of left lower limb: Secondary | ICD-10-CM | POA: Diagnosis present

## 2020-09-07 DIAGNOSIS — E039 Hypothyroidism, unspecified: Secondary | ICD-10-CM | POA: Diagnosis present

## 2020-09-07 DIAGNOSIS — R0689 Other abnormalities of breathing: Secondary | ICD-10-CM | POA: Diagnosis not present

## 2020-09-07 DIAGNOSIS — R791 Abnormal coagulation profile: Secondary | ICD-10-CM | POA: Diagnosis present

## 2020-09-07 DIAGNOSIS — G4733 Obstructive sleep apnea (adult) (pediatric): Secondary | ICD-10-CM | POA: Diagnosis present

## 2020-09-07 DIAGNOSIS — K219 Gastro-esophageal reflux disease without esophagitis: Secondary | ICD-10-CM | POA: Diagnosis present

## 2020-09-07 DIAGNOSIS — R52 Pain, unspecified: Secondary | ICD-10-CM

## 2020-09-07 LAB — CBC
HCT: 38.4 % — ABNORMAL LOW (ref 39.0–52.0)
Hemoglobin: 12.7 g/dL — ABNORMAL LOW (ref 13.0–17.0)
MCH: 31 pg (ref 26.0–34.0)
MCHC: 33.1 g/dL (ref 30.0–36.0)
MCV: 93.7 fL (ref 80.0–100.0)
Platelets: 206 10*3/uL (ref 150–400)
RBC: 4.1 MIL/uL — ABNORMAL LOW (ref 4.22–5.81)
RDW: 16.2 % — ABNORMAL HIGH (ref 11.5–15.5)
WBC: 7.2 10*3/uL (ref 4.0–10.5)
nRBC: 0 % (ref 0.0–0.2)

## 2020-09-07 LAB — COMPREHENSIVE METABOLIC PANEL
ALT: 66 U/L — ABNORMAL HIGH (ref 0–44)
AST: 42 U/L — ABNORMAL HIGH (ref 15–41)
Albumin: 3.2 g/dL — ABNORMAL LOW (ref 3.5–5.0)
Alkaline Phosphatase: 60 U/L (ref 38–126)
Anion gap: 10 (ref 5–15)
BUN: 18 mg/dL (ref 6–20)
CO2: 44 mmol/L — ABNORMAL HIGH (ref 22–32)
Calcium: 8.5 mg/dL — ABNORMAL LOW (ref 8.9–10.3)
Chloride: 83 mmol/L — ABNORMAL LOW (ref 98–111)
Creatinine, Ser: 0.73 mg/dL (ref 0.61–1.24)
GFR, Estimated: >60 mL/min (ref >60)
Glucose, Bld: 139 mg/dL — ABNORMAL HIGH (ref 70–99)
Potassium: 3 mmol/L — ABNORMAL LOW (ref 3.5–5.1)
Sodium: 137 mmol/L (ref 135–145)
Total Bilirubin: 0.9 mg/dL (ref 0.3–1.2)
Total Protein: 6.4 g/dL — ABNORMAL LOW (ref 6.5–8.1)

## 2020-09-07 LAB — TROPONIN I (HIGH SENSITIVITY)
Troponin I (High Sensitivity): 93 ng/L — ABNORMAL HIGH (ref <18)
Troponin I (High Sensitivity): 94 ng/L — ABNORMAL HIGH (ref <18)

## 2020-09-07 LAB — BRAIN NATRIURETIC PEPTIDE: B Natriuretic Peptide: 160 pg/mL — ABNORMAL HIGH (ref 0.0–100.0)

## 2020-09-07 LAB — PROTIME-INR
INR: 1.9 — ABNORMAL HIGH (ref 0.8–1.2)
Prothrombin Time: 22.1 seconds — ABNORMAL HIGH (ref 11.4–15.2)

## 2020-09-07 LAB — MAGNESIUM: Magnesium: 2 mg/dL (ref 1.7–2.4)

## 2020-09-07 MED ORDER — SODIUM CHLORIDE 0.9% FLUSH
3.0000 mL | Freq: Two times a day (BID) | INTRAVENOUS | Status: DC
  Administered 2020-09-08 – 2020-09-12 (×9): 3 mL via INTRAVENOUS

## 2020-09-07 MED ORDER — WARFARIN SODIUM 2.5 MG PO TABS
2.5000 mg | ORAL_TABLET | Freq: Once | ORAL | Status: DC
  Filled 2020-09-07: qty 1

## 2020-09-07 MED ORDER — ALLOPURINOL 100 MG PO TABS
100.0000 mg | ORAL_TABLET | Freq: Every day | ORAL | Status: DC
  Administered 2020-09-08 – 2020-09-13 (×6): 100 mg via ORAL
  Filled 2020-09-07 (×6): qty 1

## 2020-09-07 MED ORDER — NITROGLYCERIN 0.4 MG SL SUBL: 0.4000 mg | SUBLINGUAL_TABLET | SUBLINGUAL | Status: DC | PRN

## 2020-09-07 MED ORDER — ONDANSETRON HCL 4 MG/2ML IJ SOLN
4.0000 mg | Freq: Once | INTRAMUSCULAR | Status: AC
  Administered 2020-09-07: 4 mg via INTRAVENOUS
  Filled 2020-09-07: qty 2

## 2020-09-07 MED ORDER — DILTIAZEM LOAD VIA INFUSION
10.0000 mg | Freq: Once | INTRAVENOUS | Status: AC
  Administered 2020-09-07: 10 mg via INTRAVENOUS
  Filled 2020-09-07: qty 10

## 2020-09-07 MED ORDER — COLCHICINE 0.6 MG PO TABS
0.6000 mg | ORAL_TABLET | Freq: Every day | ORAL | Status: DC
  Administered 2020-09-08 – 2020-09-09 (×2): 0.6 mg via ORAL
  Filled 2020-09-07 (×2): qty 1

## 2020-09-07 MED ORDER — LEVOTHYROXINE SODIUM 50 MCG PO TABS
75.0000 ug | ORAL_TABLET | Freq: Every day | ORAL | Status: DC
  Administered 2020-09-09 – 2020-09-13 (×5): 75 ug via ORAL
  Filled 2020-09-07 (×5): qty 1

## 2020-09-07 MED ORDER — WARFARIN SODIUM 5 MG PO TABS
5.0000 mg | ORAL_TABLET | Freq: Every day | ORAL | Status: DC
  Filled 2020-09-07: qty 1

## 2020-09-07 MED ORDER — FUROSEMIDE 10 MG/ML IJ SOLN
80.0000 mg | Freq: Once | INTRAMUSCULAR | Status: AC
  Administered 2020-09-07: 80 mg via INTRAVENOUS
  Filled 2020-09-07: qty 8

## 2020-09-07 MED ORDER — LISINOPRIL 5 MG PO TABS
2.5000 mg | ORAL_TABLET | Freq: Every day | ORAL | Status: DC
  Administered 2020-09-07 – 2020-09-09 (×3): 2.5 mg via ORAL
  Filled 2020-09-07 (×3): qty 1

## 2020-09-07 MED ORDER — DULOXETINE HCL 20 MG PO CPEP
20.0000 mg | ORAL_CAPSULE | Freq: Every day | ORAL | Status: DC
  Administered 2020-09-07 – 2020-09-13 (×7): 20 mg via ORAL
  Filled 2020-09-07 (×7): qty 1

## 2020-09-07 MED ORDER — DILTIAZEM HCL 25 MG/5ML IV SOLN
10.0000 mg | Freq: Once | INTRAVENOUS | Status: AC
  Administered 2020-09-07: 10 mg via INTRAVENOUS
  Filled 2020-09-07: qty 5

## 2020-09-07 MED ORDER — WARFARIN - PHARMACIST DOSING INPATIENT
Freq: Every day | Status: DC
  Filled 2020-09-07: qty 1

## 2020-09-07 MED ORDER — FUROSEMIDE 10 MG/ML IJ SOLN
40.0000 mg | Freq: Two times a day (BID) | INTRAMUSCULAR | Status: DC
  Administered 2020-09-08 – 2020-09-09 (×4): 40 mg via INTRAVENOUS
  Filled 2020-09-07 (×4): qty 4

## 2020-09-07 MED ORDER — MECLIZINE HCL 25 MG PO TABS
12.5000 mg | ORAL_TABLET | Freq: Three times a day (TID) | ORAL | Status: DC | PRN
  Filled 2020-09-07: qty 1

## 2020-09-07 MED ORDER — ASPIRIN EC 81 MG PO TBEC
81.0000 mg | DELAYED_RELEASE_TABLET | Freq: Every day | ORAL | Status: DC
  Administered 2020-09-07 – 2020-09-13 (×7): 81 mg via ORAL
  Filled 2020-09-07 (×7): qty 1

## 2020-09-07 MED ORDER — TRAZODONE HCL 100 MG PO TABS
150.0000 mg | ORAL_TABLET | Freq: Every day | ORAL | Status: DC
  Administered 2020-09-07 – 2020-09-12 (×6): 150 mg via ORAL
  Filled 2020-09-07 (×6): qty 1

## 2020-09-07 MED ORDER — ATORVASTATIN CALCIUM 20 MG PO TABS
40.0000 mg | ORAL_TABLET | Freq: Every day | ORAL | Status: DC
  Administered 2020-09-07 – 2020-09-13 (×7): 40 mg via ORAL
  Filled 2020-09-07 (×7): qty 2

## 2020-09-07 MED ORDER — ONDANSETRON HCL 4 MG/2ML IJ SOLN: 4.0000 mg | Freq: Four times a day (QID) | INTRAMUSCULAR | Status: DC | PRN

## 2020-09-07 MED ORDER — DILTIAZEM HCL-DEXTROSE 125-5 MG/125ML-% IV SOLN (PREMIX)
5.0000 mg/h | INTRAVENOUS | Status: DC
  Administered 2020-09-07: 5 mg/h via INTRAVENOUS
  Filled 2020-09-07: qty 125

## 2020-09-07 MED ORDER — METHOCARBAMOL 750 MG PO TABS
750.0000 mg | ORAL_TABLET | Freq: Three times a day (TID) | ORAL | Status: DC
  Administered 2020-09-07 – 2020-09-13 (×17): 750 mg via ORAL
  Filled 2020-09-07 (×20): qty 1

## 2020-09-07 MED ORDER — SODIUM CHLORIDE 0.9 % IV SOLN: 250.0000 mL | INTRAVENOUS | Status: DC | PRN

## 2020-09-07 MED ORDER — OXYCODONE-ACETAMINOPHEN 7.5-325 MG PO TABS
1.0000 | ORAL_TABLET | Freq: Three times a day (TID) | ORAL | Status: DC | PRN
  Administered 2020-09-07 – 2020-09-09 (×4): 1 via ORAL
  Filled 2020-09-07 (×4): qty 1

## 2020-09-07 MED ORDER — POTASSIUM CHLORIDE CRYS ER 20 MEQ PO TBCR
20.0000 meq | EXTENDED_RELEASE_TABLET | Freq: Every day | ORAL | Status: DC
  Administered 2020-09-07: 20 meq via ORAL
  Filled 2020-09-07: qty 1

## 2020-09-07 MED ORDER — ACETAMINOPHEN 325 MG PO TABS: 650.0000 mg | ORAL_TABLET | ORAL | Status: DC | PRN

## 2020-09-07 MED ORDER — SODIUM CHLORIDE 0.9% FLUSH: 3.0000 mL | INTRAVENOUS | Status: DC | PRN

## 2020-09-07 MED ORDER — METOPROLOL TARTRATE 25 MG PO TABS
25.0000 mg | ORAL_TABLET | Freq: Three times a day (TID) | ORAL | Status: DC
  Administered 2020-09-08 (×3): 25 mg via ORAL
  Filled 2020-09-07 (×3): qty 1

## 2020-09-07 NOTE — ED Triage Notes (Signed)
Pt states that he started having CP this AM- pt on his chronic 4L [place] and was given 324 ASA by EMS- pt states also having nausea

## 2020-09-07 NOTE — ED Notes (Signed)
Pt linens changed, chucks pads placed, and external cath replaced.

## 2020-09-07 NOTE — Progress Notes (Signed)
Patient has been placed on cpap without difficulty

## 2020-09-07 NOTE — Progress Notes (Signed)
ANTICOAGULATION CONSULT NOTE - Initial Consult  Pharmacy Consult for Warfarin  Indication: atrial fibrillation  No Known Allergies  Patient Measurements: Height: 5\' 8"  (172.7 cm) Weight: (!) 172.4 kg (380 lb) IBW/kg (Calculated) : 68.4 Heparin Dosing Weight:   Vital Signs: Temp: 98.1 F (36.7 C) (07/24 1133) Temp Source: Oral (07/24 1133) BP: 132/78 (07/24 2208) Pulse Rate: 110 (07/24 1800)  Labs: Recent Labs    09/05/20 0508 09/07/20 1504 09/07/20 1737  HGB  --   --  12.7*  HCT  --   --  38.4*  PLT  --   --  206  LABPROT 27.3*  --  22.1*  INR 2.5*  --  1.9*  CREATININE 0.67  --  0.73  TROPONINIHS  --  93* 94*    Estimated Creatinine Clearance: 152.8 mL/min (by C-G formula based on SCr of 0.73 mg/dL).   Medical History: Past Medical History:  Diagnosis Date   Allergy    Anxiety    Arthritis    Asthma    Brain damage    Chronic pain of both knees 07/13/2018   Clotting disorder (HCC)    COPD (chronic obstructive pulmonary disease) (HCC)    Depression    GERD (gastroesophageal reflux disease)    HFrEF (heart failure with reduced ejection fraction) (HCC)    a. 03/2018 Echo: EF 25-30%, diff HK. Mod LAE.   History of DVT (deep vein thrombosis)    History of pulmonary embolism    a. Chronic coumadin.   Hypertension    MI (myocardial infarction) (HCC)    Morbid obesity (HCC)    Neck pain 07/13/2018   NICM (nonischemic cardiomyopathy) (HCC)    a. s/p Cath x 3 - reportedly nl cors. Last cath 2019 in [place]; b. a. 03/2018 Echo: EF 25-30%, diff HK.   Persistent atrial fibrillation (HCC)    a. 03/2018 s/p DCCV; b. CHA2DS2VASc = 1-->Xarelto (later changed to warfarin); c. 05/2018 recurrent afib-->Amio initiated.   Sleep apnea    Sleep apnea     Medications:  (Not in a hospital admission)   Assessment: Pharmacy consulted to dose warfarin for AFib in this 60 year old male . Pt was on Warfarin 5 mg PO daily PTA.   Pt is from nursing home so most likely had a dose of  warfarin on 7/24.   7/24: INR = 1.9  Goal of Therapy:  INR 2-3   Plan:  Will order warfarin 2.5 mg PO X 1 on 7/24. Will continue pt home dose of warfarin 5 mg PO daily. Will recheck INR on 7/25 with AM labs. Will check CBC daily.   [name] [name] D 09/07/2020,11:31 PM

## 2020-09-07 NOTE — ED Notes (Signed)
This RN unable to obtain IV access x2 and unable to obtain access in triage.

## 2020-09-07 NOTE — H&P (Signed)
Chief Complaint: Patient presents on account of acute shortness of breath. HPI:  [name] [name] is an 60 y.o. male with history of Morbid Obesity BMI>57, CHF(HFrEF), chronic A. fib,HTN, HLD, type II DM, COPD, chronic lymphedema, hypothyroidism, depression, anxiety, gout, OSA on CPAP,PE  and DVT on Coumadin.  Patient was discharged from the hospital barely 2 days ago after he requested to be discharged to celebrate his birthday at home.  As per patient, he took his medications yesterday but did not take medications today.  He presented on account of worsening shortness of breath and was found to be in A. fib with RVR on presentation.  He required diltiazem gtt for rate control.  He however remains in A. fib despite Cardizem drip.  He denies any URI symptoms.  Denies any fever or chills.  Notably, he was recently treated for covid-19 infection and required ICU for more intensive care.   He also denied any chest pain, nausea or vomiting.  Patient apparently has had frequent admissions and readmissions on account of noncompliance.  He has been consulted for skilled nursing facility placement but has declined on multiple occasions as per previous records.  ED course: Patient was noted to be in RVR on presentation.  BNP was 160.  Troponin was noted to have trended up to 93 from previous day of 48.  Serum potassium was 3.0.  Patient was initiated on Cardizem IV push and drip.  Heart rate at the time of evaluation was between 101-120bpm.  Past Medical History:  Diagnosis Date   Allergy    Anxiety    Arthritis    Asthma    Brain damage    Chronic pain of both knees 07/13/2018   Clotting disorder (HCC)    COPD (chronic obstructive pulmonary disease) (HCC)    Depression    GERD (gastroesophageal reflux disease)    HFrEF (heart failure with reduced ejection fraction) (HCC)    a. 03/2018 Echo: EF 25-30%, diff HK. Mod LAE.   History of DVT (deep vein thrombosis)    History of pulmonary embolism     a. Chronic coumadin.   Hypertension    MI (myocardial infarction) (HCC)    Morbid obesity (HCC)    Neck pain 07/13/2018   NICM (nonischemic cardiomyopathy) (HCC)    a. s/p Cath x 3 - reportedly nl cors. Last cath 2019 in [place]; b. a. 03/2018 Echo: EF 25-30%, diff HK.   Persistent atrial fibrillation (HCC)    a. 03/2018 s/p DCCV; b. CHA2DS2VASc = 1-->Xarelto (later changed to warfarin); c. 05/2018 recurrent afib-->Amio initiated.   Sleep apnea    Sleep apnea     Past Surgical History:  Procedure Laterality Date   CARDIOVERSION N/A 03/24/2018   Procedure: CARDIOVERSION;  Surgeon: Antonieta Iba, MD;  Location: [facility] ORS;  Service: Cardiovascular;  Laterality: N/A;   CARDIOVERSION N/A 08/08/2018   Procedure: CARDIOVERSION;  Surgeon: [name], MD;  Location: [facility] ORS;  Service: Cardiovascular;  Laterality: N/A;   hearnia repair     X 3- total of two surgeries   HERNIA REPAIR     LEG SURGERY      Family History  Problem Relation Age of Onset   Heart failure Mother    Lung cancer Mother    Lung cancer Father    Heart attack Maternal Grandmother    Heart attack Maternal Grandfather    Social History:  reports that he has never smoked. He has never used smokeless tobacco. He reports previous  alcohol use. He reports that he does not use drugs.  Allergies: No Known Allergies  (Not in a hospital admission)   Results for orders placed or performed during the hospital encounter of 09/07/20 (from the past 48 hour(s))  Troponin I (High Sensitivity)     Status: Abnormal   Collection Time: 09/07/20  3:04 PM  Result Value Ref Range   Troponin I (High Sensitivity) 93 (H) <18 ng/L    Comment: (NOTE) Elevated high sensitivity troponin I (hsTnI) values and significant  changes across serial measurements may suggest ACS but many other  chronic and acute conditions are known to elevate hsTnI results.  Refer to the "Links" section for chest pain algorithms and additional   guidance. Performed at [name], [address] [place], [place] [address]   Brain natriuretic peptide     Status: Abnormal   Collection Time: 09/07/20  5:36 PM  Result Value Ref Range   B Natriuretic Peptide 160.0 (H) 0.0 - 100.0 pg/mL    Comment: Performed at [facility], [address] [place], [place] [address]  CBC     Status: Abnormal   Collection Time: 09/07/20  5:37 PM  Result Value Ref Range   WBC 7.2 4.0 - 10.5 K/uL   RBC 4.10 (L) 4.22 - 5.81 MIL/uL   Hemoglobin 12.7 (L) 13.0 - 17.0 g/dL   HCT 96.2 (L) 22.9 - 79.8 %   MCV 93.7 80.0 - 100.0 fL   MCH 31.0 26.0 - 34.0 pg   MCHC 33.1 30.0 - 36.0 g/dL   RDW 92.1 (H) 19.4 - 17.4 %   Platelets 206 150 - 400 K/uL   nRBC 0.0 0.0 - 0.2 %    Comment: Performed at [facility], [address], , [place] [address]  Troponin I (High Sensitivity)     Status: Abnormal   Collection Time: 09/07/20  5:37 PM  Result Value Ref Range   Troponin I (High Sensitivity) 94 (H) <18 ng/L    Comment: (NOTE) Elevated high sensitivity troponin I (hsTnI) values and significant  changes across serial measurements may suggest ACS but many other  chronic and acute conditions are known to elevate hsTnI results.  Refer to the "Links" section for chest pain algorithms and additional  guidance. Performed at [facility], [address] [place], [place] [address]   Comprehensive metabolic panel     Status: Abnormal   Collection Time: 09/07/20  5:37 PM  Result Value Ref Range   Sodium 137 135 - 145 mmol/L   Potassium 3.0 (L) 3.5 - 5.1 mmol/L   Chloride 83 (L) 98 - 111 mmol/L   CO2 44 (H) 22 - 32 mmol/L   Glucose, Bld 139 (H) 70 - 99 mg/dL    Comment: Glucose reference range applies only to samples taken after fasting for at least 8 hours.   BUN 18 6 - 20 mg/dL   Creatinine, Ser 3.14 0.61 - 1.24 mg/dL   Calcium 8.5 (L) 8.9 - 10.3 mg/dL   Total Protein 6.4 (L) 6.5 - 8.1 g/dL   Albumin 3.2 (L)  3.5 - 5.0 g/dL   AST 42 (H) 15 - 41 U/L   ALT 66 (H) 0 - 44 U/L   Alkaline Phosphatase 60 38 - 126 U/L   Total Bilirubin 0.9 0.3 - 1.2 mg/dL   GFR, Estimated >97 >02 mL/min    Comment: (NOTE) Calculated using the CKD-EPI Creatinine Equation (2021)    Anion gap 10  5 - 15    Comment: Performed at [facility], [address] [place], [place] [address]  Protime-INR     Status: Abnormal   Collection Time: 09/07/20  5:37 PM  Result Value Ref Range   Prothrombin Time 22.1 (H) 11.4 - 15.2 seconds   INR 1.9 (H) 0.8 - 1.2    Comment: (NOTE) INR goal varies based on device and disease states. Performed at [facility], [address] [place], [place] [address]   Magnesium     Status: None   Collection Time: 09/07/20  5:37 PM  Result Value Ref Range   Magnesium 2.0 1.7 - 2.4 mg/dL    Comment: Performed at [facility], [address] [place], [place] [address]   DG Chest 1 View  Result Date: 09/07/2020 CLINICAL DATA:  Chest pain. EXAM: CHEST  1 VIEW COMPARISON:  August 30, 2020 FINDINGS: Stable cardiomegaly. No pneumothorax. No nodules or masses. No focal infiltrates. No overt edema. IMPRESSION: No active disease. Electronically Signed   By: Gerome Sam III M.D   On: 09/07/2020 12:01    Review of Systems  Constitutional:  Positive for activity change. Negative for diaphoresis.  HENT: Negative.    Respiratory:  Positive for shortness of breath.   Cardiovascular:  Positive for palpitations and leg swelling. Negative for chest pain.  Genitourinary: Negative.   Neurological: Negative.   Hematological: Negative.   Psychiatric/Behavioral: Negative.     Blood pressure 127/76, pulse (!) 110, temperature 98.1 F (36.7 C), temperature source Oral, resp. rate 12, height 5\' 8"  (1.727 m), weight (!) 172.4 kg, SpO2 97 %. Physical Exam Constitutional:      Appearance: He is obese. He is ill-appearing.  HENT:     Head: Normocephalic and atraumatic.   Cardiovascular:     Rate and Rhythm: Rhythm irregular.     Heart sounds: S1 normal and S2 normal.  Pulmonary:     Breath sounds: Examination of the right-upper field reveals decreased breath sounds. Examination of the left-upper field reveals decreased breath sounds. Examination of the right-middle field reveals decreased breath sounds. Examination of the left-middle field reveals decreased breath sounds. Examination of the right-lower field reveals decreased breath sounds. Examination of the left-lower field reveals decreased breath sounds. Decreased breath sounds present.     Comments: Due to body habitus Musculoskeletal:     Cervical back: Neck supple.  Feet:     Comments: Unable to assess for pulses as patient bilateral foot are wrapped Neurological:     Mental Status: He is alert and oriented to person, place, and time. Mental status is at baseline.     Assessment/Plan  60 year old with Morbid obesity A. fib with RVR, CHF exacerbation most likely due to arrhythmia and noncompliance of medication.  A. fib with RVR: Present on admission: Most likely due to noncompliance of medication.  Patient required Cardizem gtt for rate control.  Patient is already on anticoagulation with Coumadin.  INR was noted to be 1.9.  Acute on chronic biventricular systolic heart failure: Echocardiogram in 2/22 EF 30 to 35%. Echocardiogram 7/16 showed poor acoustic window hence LVEF could not be calculated. Patient was placed back on IV Lasix 40 mg twice daily.  Follow-up with daily weights.  Fluid restriction to 1200cc per day advised.Continue lisinopril.  Resume long-acting metoprolol when A. fib and CHF has improved.  Elevated troponin due to demand myocardial ischemia likely due to A. fib with RVR.  Underlying CAD, HLD likely to be  contributory.    Subtherapeutic INR: INR 1.9.  No evidence of bleeding diathesis.  Patient was resumed on regular dose of Coumadin.  Follow-up with PT INR in  a.m.  Hypokalemia: Present on admission.  Potassium repleted as appropriate.  Follow-up with serum potassium levels in a.m.  Follow-up with serum magnesium levels as well.  Chronic hypoxic respiratory failure, COPD, OSA: Currently stable on 3 L/min of oxygen. CPAP qHS. Continue bronchodilators, singulair as scheduled.[name]     COVID-19 infection: +7/16. Completed 5 days remdesivir (7/21).Patient will be continued in isolation protocol    Left leg cellulitis: Nonpurulent.Recently completed ceftriaxone x5 days.   Depression and mood disorder.Alcohol use disorder. No longer appears to be in alcohol withdrawal. Denies frequent use. Cessation counseling provided.   History of gout: Continue allopurinol and colchicine   Osteoarthritis, chronic pain: Multimodal pain medications, voltaren, PT/OT, robaxin prn.   GERD: Continue PPI.   Hypothyroidism: TSH mildly elevated but free T4 normal in setting of acute illness. Continue synthroid at home dose, recheck at follow up.   Thrombocytopenia: Likely due to covid. Resolved.   Morbid obesity: Estimated body mass index is 57. kg/m    Lilia Pro, MD 09/07/2020, 8:49 PM

## 2020-09-07 NOTE — ED Notes (Signed)
4 of the pts 10 PM Meds were not given due to pharmacy not sending them down

## 2020-09-07 NOTE — ED Triage Notes (Signed)
Pt in via EMS from home with c/o sudden onset of CP. Pt with hx of a-fib, HR 150, currently in A-fib.  Pt was given 1 sublingual nitro. Pt on 4L sats 94%. Pt also given 324mg  of asa.
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
## 2020-09-08 DIAGNOSIS — I5082 Biventricular heart failure: Secondary | ICD-10-CM | POA: Diagnosis present

## 2020-09-08 DIAGNOSIS — J441 Chronic obstructive pulmonary disease with (acute) exacerbation: Secondary | ICD-10-CM | POA: Diagnosis present

## 2020-09-08 DIAGNOSIS — E039 Hypothyroidism, unspecified: Secondary | ICD-10-CM

## 2020-09-08 DIAGNOSIS — R778 Other specified abnormalities of plasma proteins: Secondary | ICD-10-CM

## 2020-09-08 DIAGNOSIS — E876 Hypokalemia: Secondary | ICD-10-CM | POA: Diagnosis present

## 2020-09-08 DIAGNOSIS — E119 Type 2 diabetes mellitus without complications: Secondary | ICD-10-CM | POA: Diagnosis present

## 2020-09-08 DIAGNOSIS — I517 Cardiomegaly: Secondary | ICD-10-CM | POA: Diagnosis not present

## 2020-09-08 DIAGNOSIS — F1010 Alcohol abuse, uncomplicated: Secondary | ICD-10-CM | POA: Diagnosis present

## 2020-09-08 DIAGNOSIS — Z7189 Other specified counseling: Secondary | ICD-10-CM | POA: Diagnosis not present

## 2020-09-08 DIAGNOSIS — R0602 Shortness of breath: Secondary | ICD-10-CM | POA: Diagnosis present

## 2020-09-08 DIAGNOSIS — R079 Chest pain, unspecified: Secondary | ICD-10-CM | POA: Diagnosis not present

## 2020-09-08 DIAGNOSIS — Z6841 Body Mass Index (BMI) 40.0 and over, adult: Secondary | ICD-10-CM | POA: Diagnosis not present

## 2020-09-08 DIAGNOSIS — Z8616 Personal history of COVID-19: Secondary | ICD-10-CM | POA: Diagnosis not present

## 2020-09-08 DIAGNOSIS — L03116 Cellulitis of left lower limb: Secondary | ICD-10-CM | POA: Diagnosis present

## 2020-09-08 DIAGNOSIS — J9611 Chronic respiratory failure with hypoxia: Secondary | ICD-10-CM | POA: Diagnosis present

## 2020-09-08 DIAGNOSIS — I5023 Acute on chronic systolic (congestive) heart failure: Secondary | ICD-10-CM | POA: Diagnosis not present

## 2020-09-08 DIAGNOSIS — F419 Anxiety disorder, unspecified: Secondary | ICD-10-CM | POA: Diagnosis present

## 2020-09-08 DIAGNOSIS — I110 Hypertensive heart disease with heart failure: Secondary | ICD-10-CM | POA: Diagnosis present

## 2020-09-08 DIAGNOSIS — G4733 Obstructive sleep apnea (adult) (pediatric): Secondary | ICD-10-CM | POA: Diagnosis present

## 2020-09-08 DIAGNOSIS — N179 Acute kidney failure, unspecified: Secondary | ICD-10-CM | POA: Diagnosis present

## 2020-09-08 DIAGNOSIS — I4891 Unspecified atrial fibrillation: Secondary | ICD-10-CM | POA: Diagnosis present

## 2020-09-08 DIAGNOSIS — E785 Hyperlipidemia, unspecified: Secondary | ICD-10-CM | POA: Diagnosis present

## 2020-09-08 DIAGNOSIS — I509 Heart failure, unspecified: Secondary | ICD-10-CM | POA: Diagnosis not present

## 2020-09-08 DIAGNOSIS — F32A Depression, unspecified: Secondary | ICD-10-CM | POA: Diagnosis present

## 2020-09-08 DIAGNOSIS — I252 Old myocardial infarction: Secondary | ICD-10-CM | POA: Diagnosis not present

## 2020-09-08 DIAGNOSIS — I248 Other forms of acute ischemic heart disease: Secondary | ICD-10-CM | POA: Diagnosis present

## 2020-09-08 DIAGNOSIS — L03115 Cellulitis of right lower limb: Secondary | ICD-10-CM | POA: Diagnosis present

## 2020-09-08 DIAGNOSIS — I4821 Permanent atrial fibrillation: Secondary | ICD-10-CM | POA: Diagnosis present

## 2020-09-08 DIAGNOSIS — D6959 Other secondary thrombocytopenia: Secondary | ICD-10-CM | POA: Diagnosis present

## 2020-09-08 DIAGNOSIS — Z515 Encounter for palliative care: Secondary | ICD-10-CM | POA: Diagnosis not present

## 2020-09-08 DIAGNOSIS — Z5181 Encounter for therapeutic drug level monitoring: Secondary | ICD-10-CM | POA: Diagnosis not present

## 2020-09-08 DIAGNOSIS — I428 Other cardiomyopathies: Secondary | ICD-10-CM | POA: Diagnosis present

## 2020-09-08 LAB — MAGNESIUM: Magnesium: 1.9 mg/dL (ref 1.7–2.4)

## 2020-09-08 LAB — COMPREHENSIVE METABOLIC PANEL
ALT: 47 U/L — ABNORMAL HIGH (ref 0–44)
AST: 26 U/L (ref 15–41)
Albumin: 3.2 g/dL — ABNORMAL LOW (ref 3.5–5.0)
Alkaline Phosphatase: 60 U/L (ref 38–126)
Anion gap: 9 (ref 5–15)
BUN: 17 mg/dL (ref 6–20)
CO2: 41 mmol/L — ABNORMAL HIGH (ref 22–32)
Calcium: 9 mg/dL (ref 8.9–10.3)
Chloride: 84 mmol/L — ABNORMAL LOW (ref 98–111)
Creatinine, Ser: 0.92 mg/dL (ref 0.61–1.24)
GFR, Estimated: >60 mL/min (ref >60)
Glucose, Bld: 151 mg/dL — ABNORMAL HIGH (ref 70–99)
Potassium: 4.1 mmol/L (ref 3.5–5.1)
Sodium: 134 mmol/L — ABNORMAL LOW (ref 135–145)
Total Bilirubin: 0.8 mg/dL (ref 0.3–1.2)
Total Protein: 6.3 g/dL — ABNORMAL LOW (ref 6.5–8.1)

## 2020-09-08 LAB — PROTIME-INR
INR: 1.4 — ABNORMAL HIGH (ref 0.8–1.2)
Prothrombin Time: 17.6 seconds — ABNORMAL HIGH (ref 11.4–15.2)

## 2020-09-08 LAB — PHOSPHORUS: Phosphorus: 4.7 mg/dL — ABNORMAL HIGH (ref 2.5–4.6)

## 2020-09-08 MED ORDER — THIAMINE HCL 100 MG/ML IJ SOLN
100.0000 mg | Freq: Every day | INTRAMUSCULAR | Status: DC
  Filled 2020-09-08: qty 2

## 2020-09-08 MED ORDER — THIAMINE HCL 100 MG/ML IJ SOLN: 100.0000 mg | Freq: Every day | INTRAMUSCULAR | Status: DC

## 2020-09-08 MED ORDER — POTASSIUM CHLORIDE CRYS ER 20 MEQ PO TBCR
40.0000 meq | EXTENDED_RELEASE_TABLET | Freq: Two times a day (BID) | ORAL | Status: DC
  Administered 2020-09-08 – 2020-09-09 (×4): 40 meq via ORAL
  Filled 2020-09-08 (×5): qty 2

## 2020-09-08 MED ORDER — DILTIAZEM HCL ER COATED BEADS 180 MG PO CP24
180.0000 mg | ORAL_CAPSULE | Freq: Every day | ORAL | Status: DC
  Administered 2020-09-08: 180 mg via ORAL
  Filled 2020-09-08: qty 1

## 2020-09-08 MED ORDER — ADULT MULTIVITAMIN W/MINERALS CH: 1.0000 | ORAL_TABLET | Freq: Every day | ORAL | Status: DC

## 2020-09-08 MED ORDER — LORAZEPAM 2 MG/ML IJ SOLN: 0.0000 mg | Freq: Two times a day (BID) | INTRAMUSCULAR | Status: DC

## 2020-09-08 MED ORDER — LORAZEPAM 2 MG/ML IJ SOLN: 1.0000 mg | INTRAMUSCULAR | Status: DC | PRN

## 2020-09-08 MED ORDER — LORAZEPAM 1 MG PO TABS: 1.0000 mg | ORAL_TABLET | ORAL | Status: DC | PRN

## 2020-09-08 MED ORDER — POTASSIUM CHLORIDE CRYS ER 20 MEQ PO TBCR
40.0000 meq | EXTENDED_RELEASE_TABLET | Freq: Once | ORAL | Status: AC
  Administered 2020-09-08: 40 meq via ORAL
  Filled 2020-09-08: qty 2

## 2020-09-08 MED ORDER — THIAMINE HCL 100 MG PO TABS
100.0000 mg | ORAL_TABLET | Freq: Every day | ORAL | Status: DC
  Administered 2020-09-08 – 2020-09-13 (×6): 100 mg via ORAL
  Filled 2020-09-08 (×6): qty 1

## 2020-09-08 MED ORDER — LORAZEPAM 1 MG PO TABS
1.0000 mg | ORAL_TABLET | ORAL | Status: DC | PRN
  Administered 2020-09-09: 1 mg via ORAL
  Filled 2020-09-08: qty 1

## 2020-09-08 MED ORDER — POTASSIUM CHLORIDE CRYS ER 20 MEQ PO TBCR: 20.0000 meq | EXTENDED_RELEASE_TABLET | Freq: Every day | ORAL | Status: DC

## 2020-09-08 MED ORDER — ADULT MULTIVITAMIN W/MINERALS CH
1.0000 | ORAL_TABLET | Freq: Every day | ORAL | Status: DC
  Administered 2020-09-08 – 2020-09-13 (×6): 1 via ORAL
  Filled 2020-09-08 (×6): qty 1

## 2020-09-08 MED ORDER — ENOXAPARIN SODIUM 300 MG/3ML IJ SOLN
1.0000 mg/kg | Freq: Two times a day (BID) | INTRAMUSCULAR | Status: DC
  Administered 2020-09-09 (×2): 170 mg via SUBCUTANEOUS
  Filled 2020-09-08 (×5): qty 1.7

## 2020-09-08 MED ORDER — THIAMINE HCL 100 MG PO TABS: 100.0000 mg | ORAL_TABLET | Freq: Every day | ORAL | Status: DC

## 2020-09-08 MED ORDER — DICLOFENAC SODIUM 1 % EX GEL
4.0000 g | Freq: Four times a day (QID) | CUTANEOUS | Status: DC
  Administered 2020-09-08 – 2020-09-12 (×16): 4 g via TOPICAL
  Filled 2020-09-08 (×2): qty 100

## 2020-09-08 MED ORDER — LORAZEPAM 2 MG/ML IJ SOLN
0.0000 mg | Freq: Four times a day (QID) | INTRAMUSCULAR | Status: DC
Start: 2020-09-08 — End: 2020-09-10

## 2020-09-08 MED ORDER — WARFARIN SODIUM 7.5 MG PO TABS
7.5000 mg | ORAL_TABLET | Freq: Once | ORAL | Status: AC
  Administered 2020-09-08: 7.5 mg via ORAL
  Filled 2020-09-08: qty 1

## 2020-09-08 MED ORDER — FOLIC ACID 1 MG PO TABS: 1.0000 mg | ORAL_TABLET | Freq: Every day | ORAL | Status: DC

## 2020-09-08 MED ORDER — FOLIC ACID 1 MG PO TABS
1.0000 mg | ORAL_TABLET | Freq: Every day | ORAL | Status: DC
  Administered 2020-09-08 – 2020-09-13 (×6): 1 mg via ORAL
  Filled 2020-09-08 (×6): qty 1

## 2020-09-08 NOTE — ED Notes (Signed)
Pt received to room 31 from 24. Pt came with Cardizem gtt hooked up to PIV but clamped with no IV pump. Pt HR increased to 120s without ordered medication running. Pt reconnected to Cardizem at ordered rate and will continue to monitor.

## 2020-09-08 NOTE — ED Notes (Signed)
Pt ate 100% of meal.

## 2020-09-08 NOTE — ED Notes (Signed)
PT in room working with patient at this time.

## 2020-09-08 NOTE — Progress Notes (Signed)
PT Cancellation Note  Patient Details Name: [name] [name] MRN: [id number] DOB: [birth year]   Cancelled Treatment:    Reason Eval/Treat Not Completed: Patient declined to participate with PT and OT this admission.  Pt stated that he has participated with PT/OT many times over multiple prior admissions and that "It doesn't help, it just hurts". Pt educated on risks for deconditioning and loss of function associated with being sedentary with pt again declining to participate with rehab this admission.  Educated pt to alert the medical team if he changes his mind and PT/OT can be re-ordered if appropriate.  Pt understood and agreed.  Will complete PT orders at this time but will reassess pt pending a change in status upon receipt of new PT orders.    Ovidio Hanger PT, DPT 09/08/20, 2:50 PM

## 2020-09-08 NOTE — Progress Notes (Addendum)
ANTICOAGULATION CONSULT NOTE - Initial Consult  Pharmacy Consult for Warfarin and lovenox bridge Indication: atrial fibrillation  No Known Allergies  Patient Measurements: Height: 5\' 8"  (172.7 cm) Weight: (!) 172.4 kg (380 lb) IBW/kg (Calculated) : 68.4 Heparin Dosing Weight:   Vital Signs: Temp: 98.3 F (36.8 C) (07/25 0925) Temp Source: Oral (07/25 0925) BP: 105/67 (07/25 1009) Pulse Rate: 112 (07/25 0925)  Labs: Recent Labs    09/07/20 1504 09/07/20 1737 09/08/20 0735  HGB  --  12.7*  --   HCT  --  38.4*  --   PLT  --  206  --   LABPROT  --  22.1* 17.6*  INR  --  1.9* 1.4*  CREATININE  --  0.73  --   TROPONINIHS 93* 94*  --      Estimated Creatinine Clearance: 152.8 mL/min (by C-G formula based on SCr of 0.73 mg/dL).   Medical History: Past Medical History:  Diagnosis Date   Allergy    Anxiety    Arthritis    Asthma    Brain damage    Chronic pain of both knees 07/13/2018   Clotting disorder (HCC)    COPD (chronic obstructive pulmonary disease) (HCC)    Depression    GERD (gastroesophageal reflux disease)    HFrEF (heart failure with reduced ejection fraction) (HCC)    a. 03/2018 Echo: EF 25-30%, diff HK. Mod LAE.   History of DVT (deep vein thrombosis)    History of pulmonary embolism    a. Chronic coumadin.   Hypertension    MI (myocardial infarction) (HCC)    Morbid obesity (HCC)    Neck pain 07/13/2018   NICM (nonischemic cardiomyopathy) (HCC)    a. s/p Cath x 3 - reportedly nl cors. Last cath 2019 in [place]; b. a. 03/2018 Echo: EF 25-30%, diff HK.   Persistent atrial fibrillation (HCC)    a. 03/2018 s/p DCCV; b. CHA2DS2VASc = 1-->Xarelto (later changed to warfarin); c. 05/2018 recurrent afib-->Amio initiated.   Sleep apnea    Sleep apnea     Medications:  (Not in a hospital admission)  Assessment: 60 y.o. male with medical history significant of dCHF, hypertension, hyperlipidemia, diet-controlled diabetes, COPD, hypothyroidism, gout, depression,  anxiety, OSA on CPAP, atrial fibrillation, PE/DVT on Coumadin, CAD, alcohol abuse, obesity, who presents with shortness of breath w/u in ED revealed Afib w RVR. Pt has a history of being subtherapeutic on warfarin and had been switched to Eliquis due to non-compliance with INR checks, however, now pt is back on warfarin. Pharmacy consulted to dose warfarin for Afib w/ h/o DVT/PE . Pt was on Warfarin 5 mg PO daily PTA.   Pt is from nursing home so most likely had a dose of warfarin on 7/24.    Medications: Pharmacy technician spoke with pt who confirmed pt takes warfarin 5 mg daily and his last dose was 7/23.    Date    INR      Dose/Comment 7/16     1.1       7.5 mg 7/17     1.4       7.5 mg 7/18     1.8       5 mg (0 >100% meals eaten) 7/19     2.5       5 mg     7/20     2.3       5 mg 7/21     2.3  5 mg --------- 7/23 --- 5mg  PTA 7/24 1.9 (2.5mg  ordered but not given?) 7/25 1.4 7.5mg  (lovenox bridge started)   Goal of Therapy:  INR 2-3   Plan:  Lovenox Plan: Will bridge with lovenox until INR therapeutic >2. Pt wt <180kg so no lab monitoring of anti-Xa req'd. Lovenox 1mg /kg q12h  Monitor daily CBC; trend PLTs.  Warfarin Plan: Per pt report on med rec, last dose of 5mg  taken on 7/23 and no warfarin received on 7/24 on admit. INR 1.9>1.4, anticipate INR falls further since no dose last night. Will increase tonight's dose by 1.5x home dose x1 and bridge with lovenox until therapeutic INR. Will order warfarin 7.5 mg PO X 1 on 7/25. Will recheck INR on 7/26 with AM labs. Will check CBC daily.   8/24 Maximilian Tallo 09/08/2020,11:04 AM

## 2020-09-08 NOTE — ED Notes (Signed)
Patient ready to go to his room. Floor RN is requesting a CIWA order prior to pt coming to his room. Admitting MD messaged regarding this.

## 2020-09-08 NOTE — Progress Notes (Signed)
Patient ID: [name] [name], male   DOB: 05-22-1960, 60 y.o.   MRN: [id number] [facility] PROGRESS NOTE  [name] [name] KYH:[id number] DOB: [birth year] DOA: 09/07/2020 PCP: [name], DO  HPI/Subjective: Patient complaining of headache.  Also had some sharp chest pains.  Was found in rapid atrial fibrillation.  Recent COVID infection that was treated with remdesivir.  Patient does have some shortness of breath.  Feels weak.  Objective: Vitals:   09/08/20 1318 09/08/20 1432  BP: (!) 137/102   Pulse: 92 85  Resp: 16   Temp: 98.2 F (36.8 C)   SpO2: 95%     Intake/Output Summary (Last 24 hours) at 09/08/2020 1438 Last data filed at 09/08/2020 1302 Gross per 24 hour  Intake --  Output 2300 ml  Net -2300 ml   Filed Weights   09/07/20 1130  Weight: (!) 172.4 kg    ROS: Review of Systems  Respiratory:  Positive for shortness of breath.   Cardiovascular:  Positive for chest pain.  Gastrointestinal:  Negative for abdominal pain, nausea and vomiting.  Exam: Physical Exam HENT:     Head: Normocephalic.     Mouth/Throat:     Pharynx: No oropharyngeal exudate.  Eyes:     General: Lids are normal.     Conjunctiva/sclera: Conjunctivae normal.     Pupils: Pupils are equal, round, and reactive to light.  Cardiovascular:     Rate and Rhythm: Normal rate. Rhythm irregularly irregular.     Heart sounds: Normal heart sounds, S1 normal and S2 normal.  Pulmonary:     Breath sounds: Examination of the right-lower field reveals decreased breath sounds. Examination of the left-lower field reveals decreased breath sounds. Decreased breath sounds present. No wheezing, rhonchi or rales.  Abdominal:     Palpations: Abdomen is soft.     Tenderness: There is no abdominal tenderness.  Musculoskeletal:     Right ankle: Swelling present.     Left ankle: Swelling present.  Skin:    General: Skin is warm.     Findings: No rash.  Neurological:     Mental Status:  He is alert and oriented to person, place, and time.     Data Reviewed: Basic Metabolic Panel: Recent Labs  Lab 09/02/20 0557 09/03/20 0511 09/04/20 0518 09/05/20 0508 09/07/20 1737  NA 136 134* 137 138 137  K 4.2 4.1 3.9 3.9 3.0*  CL 90* 87* 85* 83* 83*  CO2 35* 37* 41* 43* 44*  GLUCOSE 223* 241* 182* 185* 139*  BUN 23* 27* 30* 29* 18  CREATININE 0.75 0.77 0.87 0.67 0.73  CALCIUM 8.4* 8.6* 8.8* 8.5* 8.5*  MG  --  1.4*  --   --  2.0   Liver Function Tests: Recent Labs  Lab 09/02/20 0557 09/03/20 0511 09/04/20 0518 09/07/20 1737  AST 48* 29 22 42*  ALT 42 34 28 66*  ALKPHOS 73 68 67 60  BILITOT 0.5 0.4 0.5 0.9  PROT 6.8 6.4* 7.0 6.4*  ALBUMIN 2.8* 2.9* 3.2* 3.2*   CBC: Recent Labs  Lab 09/02/20 0557 09/03/20 0511 09/04/20 0518 09/07/20 1737  WBC 5.9 8.0 6.4 7.2  NEUTROABS 5.5 7.4 5.4  --   HGB 10.7* 11.2* 12.2* 12.7*  HCT 34.1* 35.4* 38.1* 38.4*  MCV 97.7 94.9 94.1 93.7  PLT 134* 140* 166 206   BNP (last 3 results) Recent Labs    07/02/20 1433 08/30/20 0819 09/07/20 1736  BNP 88.2 442.3* 160.0*     CBG:  Recent Labs  Lab 09/04/20 0932 09/04/20 1133 09/04/20 1639 09/04/20 1940 09/05/20 0829  GLUCAP 155* 124* 248* 178* 198*    Recent Results (from the past 240 hour(s))  SARS CORONAVIRUS 2 (TAT 6-24 HRS) Nasopharyngeal Nasopharyngeal Swab     Status: Abnormal   Collection Time: 08/30/20 11:20 AM   Specimen: Nasopharyngeal Swab  Result Value Ref Range Status   SARS Coronavirus 2 POSITIVE (A) NEGATIVE Final    Comment: (NOTE) SARS-CoV-2 target nucleic acids are DETECTED.  The SARS-CoV-2 RNA is generally detectable in upper and lower respiratory specimens during the acute phase of infection. Positive results are indicative of the presence of SARS-CoV-2 RNA. Clinical correlation with patient history and other diagnostic information is  necessary to determine patient infection status. Positive results do not rule out bacterial infection or  co-infection with other viruses.  The expected result is Negative.  Fact Sheet for Patients: HairSlick.no  Fact Sheet for Healthcare Providers: quierodirigir.com  This test is not yet approved or cleared by the [place] FDA and  has been authorized for detection and/or diagnosis of SARS-CoV-2 by FDA under an Emergency Use Authorization (EUA). This EUA will remain  in effect (meaning this test can be used) for the duration of the COVID-19 declaration under Section 564(b)(1) of the Act, 21 U. S.C. section 360bbb-3(b)(1), unless the authorization is terminated or revoked sooner.   Performed at [facility], [address], [place], [place] [address]   MRSA Next Gen by PCR, Nasal     Status: Abnormal   Collection Time: 09/01/20 12:52 AM   Specimen: Nasal Mucosa; Nasal Swab  Result Value Ref Range Status   MRSA by PCR Next Gen DETECTED (A) NOT DETECTED Final    Comment: RESULT CALLED TO, READ BACK BY AND VERIFIED WITH: KERRI STIPANOV @ 023 09/01/20 LFD (NOTE) The GeneXpert MRSA Assay (FDA approved for NASAL specimens only), is one component of a comprehensive MRSA colonization surveillance program. It is not intended to diagnose MRSA infection nor to guide or monitor treatment for MRSA infections. Test performance is not FDA approved in patients less than 91 years old. Performed at [facility], [address], [place], [place] [address]      Studies: DG Chest 1 View  Result Date: 09/07/2020 CLINICAL DATA:  Chest pain. EXAM: CHEST  1 VIEW COMPARISON:  August 30, 2020 FINDINGS: Stable cardiomegaly. No pneumothorax. No nodules or masses. No focal infiltrates. No overt edema. IMPRESSION: No active disease. Electronically Signed   By: Gerome Sam III M.D   On: 09/07/2020 12:01    Scheduled Meds:  allopurinol  100 mg Oral Daily   aspirin EC  81 mg Oral Daily   atorvastatin  40 mg Oral Daily   colchicine   0.6 mg Oral Daily   diltiazem  180 mg Oral Daily   DULoxetine  20 mg Oral Daily   enoxaparin (LOVENOX) injection  1 mg/kg Subcutaneous Q12H   furosemide  40 mg Intravenous BID   levothyroxine  75 mcg Oral QAC breakfast   lisinopril  2.5 mg Oral Daily   methocarbamol  750 mg Oral TID   metoprolol tartrate  25 mg Oral Q8H   [START ON 09/09/2020] potassium chloride SA  20 mEq Oral Daily   sodium chloride flush  3 mL Intravenous Q12H   traZODone  150 mg Oral QHS   warfarin  7.5 mg Oral ONCE-1600   Warfarin - Pharmacist Dosing Inpatient   Does not apply q1600  Continuous Infusions:  sodium chloride     diltiazem (CARDIZEM) infusion Stopped (09/08/20 1249)    Assessment/Plan:  Atrial fibrillation with rapid ventricular response.  Was on Cardizem drip this morning.  Started oral Cardizem to try to get off drip.  Was able to come off drip this afternoon.  Continue metoprolol and Cardizem orally. Hypokalemia replace potassium orally Acute on chronic systolic congestive heart failure.  On IV Lasix twice daily.  Potassium supplementation Elevated troponin demand ischemia from rapid atrial fibrillation Subtherapeutic INR down to 1.4 today.  Add Lovenox injections to Coumadin Chronic hypoxic respiratory failure on 3 L of oxygen.  His oxygen was not hooked up when I was in the room and added him back on 3 L. Recent COVID-19 infection completed 5 days of remdesivir.  Continue 10 days of isolation. Alcohol abuse.  Sister states that he goes home and drinks alcohol and then ends up coming back into the hospital Chronic osteoarthritis Voltaren gel Weakness.  Physical therapy evaluation Hypothyroidism unspecified on levothyroxine Depression on Cymbalta and trazodone Repeated hospitalizations palliative care consultation     Code Status:     Code Status Orders  (From admission, onward)           Start     Ordered   09/07/20 2152  Full code  Continuous        09/07/20 2152            Code Status History     Date Active Date Inactive Code Status Order ID Comments User Context   08/30/2020 1008 09/05/2020 1907 Full Code [id number]  Lorretta Harp, MD ED   06/08/2020 1434 06/29/2020 1802 DNR [id number]  Cox, Amy N, DO ED   05/04/2020 0547 05/16/2020 2156 Full Code [id number]  Andris Baumann, MD ED   04/30/2020 1120 05/02/2020 1830 Full Code [id number]  Lucile Shutters, MD ED   01/27/2020 0439 02/02/2020 2042 Full Code [id number]  Andris Baumann, MD ED   01/07/2020 1855 01/12/2020 1948 Full Code [id number]  Cox, Amy N, DO ED   12/22/2019 1631 12/28/2019 2132 Partial Code [id number]  Cox, Amy N, DO ED   11/07/2019 1928 11/10/2019 1942 Full Code [id number]  Lorretta Harp, MD ED   08/31/2019 1614 09/02/2019 0231 Full Code [id number]  Shaune Pollack, MD ED   08/09/2019 0621 08/14/2019 2012 Full Code [id number]  Andris Baumann, MD ED   06/22/2019 1531 06/26/2019 1742 Full Code [id number]  Lorretta Harp, MD Inpatient   03/04/2019 2314 03/05/2019 2105 Full Code [id number]  Anselm Jungling, DO ED   10/24/2018 0420 10/28/2018 1849 Full Code [id number]  Oralia Manis, MD Inpatient   08/06/2018 1815 08/10/2018 2042 Full Code [id number]  Alford Highland, MD ED   07/18/2018 0716 07/23/2018 1827 Full Code [id number]  Arnaldo Natal, MD ED   05/29/2018 1453 06/01/2018 1931 Full Code [id number]  Bertrum Sol, MD Inpatient   03/17/2018 0042 03/25/2018 1554 Full Code [id number]  Altamese Dilling, MD ED   02/24/2018 0725 02/27/2018 1555 Full Code [id number]  Oralia Manis, MD Inpatient   01/27/2018 1345 01/29/2018 1406 Full Code [id number]  Alford Highland, MD ED   01/17/2018 1330 01/19/2018 1818 Partial Code [id number]  Milagros Loll, MD Inpatient   01/16/2018 1427 01/17/2018 1330 Full Code [id number]  Auburn Bilberry, MD ED      Family Communication: Spoke with sister on the phone Disposition Plan: Status is: Inpatient  Dispo: The patient is from: Home  Anticipated d/c is to: Home              Patient currently with  repeated hospitalizations, overall prognosis is poor.   Difficult to place patient.  No.  Time spent: 27 minutes  [name] [name] [employer]

## 2020-09-08 NOTE — ED Notes (Signed)
Pt asking for cough syrup , messaged MD for order

## 2020-09-08 NOTE — Progress Notes (Signed)
OT Cancellation Note  Patient Details Name: [name] [name] MRN: [id number] DOB: 1960-12-02   Cancelled Treatment:    Reason Eval/Treat Not Completed: Other (comment) (Patient declined to participate with PT and OT this admission.). Order received and chart reviewed. Per discussion with PT, pt declining to participate with OT this admission. OT to complete orders at this time. Please re-consult if additional needs arise or if pt motivated to participate with therapy this admission.  Matthew Folks, OTR/L [facility] [phone]

## 2020-09-08 NOTE — ED Notes (Signed)
Pt refuses to keep his B/P cuff on

## 2020-09-08 NOTE — TOC Progression Note (Signed)
Transition of Care [facility]) - Progression Note    Patient Details  Name: Wendle Kina MRN: [id number] Date of Birth: [birth year]  Transition of [facility]) CM/SW Contact  Hetty Ely, RN Phone Number: 09/08/2020, 11:36 AM  Clinical Narrative:  Received a call from DSS/APS Diona Fanti requesting admission information update for patient. Spoke with Verlon Au about admission complaint of Chest Pain, now on Cardizem drip and Oxygen/C-Pap and will be admitted for further treatment. TOC will continue to track for discharge needs.          Expected Discharge Plan and Services                                                 Social Determinants of Health (SDOH) Interventions    Readmission Risk Interventions Readmission Risk Prevention Plan 01/11/2020 12/24/2019 01/25/2019  Transportation Screening Complete Complete Complete  PCP or Specialist Appt within 3-5 Days - - -  HRI or Home Care Consult - - -  Social Work Consult for Recovery Care Planning/Counseling - - -  Palliative Care Screening - - -  Medication Review [employer]) Complete Complete Complete  PCP or Specialist appointment within 3-5 days of discharge Complete Complete Complete  HRI or Home Care Consult - - Complete  SW Recovery Care/Counseling Consult Complete Complete Complete  Palliative Care Screening Complete Not Applicable Not Applicable  Skilled Nursing Facility Not Applicable Not Applicable Not Applicable

## 2020-09-08 NOTE — ED Notes (Signed)
Cardizem increased to 10mg /hr , pt is still hemodynically stable

## 2020-09-08 NOTE — ED Notes (Signed)
Pt given a bedside table and urinal

## 2020-09-08 NOTE — ED Notes (Signed)
MD notified of BP 111/43 via secure chat. Per MD, continue to give ordered Metoprolol.

## 2020-09-08 NOTE — ED Notes (Signed)
Breakfast tray delivered to pt at this time.  

## 2020-09-08 NOTE — ED Notes (Signed)
Pt provided with meal tray at this time 

## 2020-09-08 NOTE — ED Notes (Signed)
Pt given a cup of ice water

## 2020-09-08 NOTE — ED Notes (Signed)
cardizem increased to 7 mg/hr

## 2020-09-08 NOTE — ED Notes (Signed)
Dr. Renae Gloss at bedside assessing pt and discussing plan of care at this time.

## 2020-09-08 NOTE — ED Notes (Addendum)
Patient transported to 248[facility]A via stretcher by RN, stable at transfer. RT called to take Cipap to pt's room

## 2020-09-08 NOTE — ED Notes (Signed)
Pt HOB lowered per request  

## 2020-09-08 NOTE — ED Notes (Signed)
Pt requesting to be placed back on CPAP to take a nap.

## 2020-09-09 ENCOUNTER — Encounter: Payer: Self-pay | Admitting: Internal Medicine

## 2020-09-09 DIAGNOSIS — Z5181 Encounter for therapeutic drug level monitoring: Secondary | ICD-10-CM

## 2020-09-09 DIAGNOSIS — I482 Chronic atrial fibrillation, unspecified: Secondary | ICD-10-CM

## 2020-09-09 DIAGNOSIS — Z7901 Long term (current) use of anticoagulants: Secondary | ICD-10-CM

## 2020-09-09 LAB — CBC
HCT: 44.1 % (ref 39.0–52.0)
Hemoglobin: 13.6 g/dL (ref 13.0–17.0)
MCH: 29.8 pg (ref 26.0–34.0)
MCHC: 30.8 g/dL (ref 30.0–36.0)
MCV: 96.5 fL (ref 80.0–100.0)
Platelets: 222 10*3/uL (ref 150–400)
RBC: 4.57 MIL/uL (ref 4.22–5.81)
RDW: 16.1 % — ABNORMAL HIGH (ref 11.5–15.5)
WBC: 5.2 10*3/uL (ref 4.0–10.5)
nRBC: 0 % (ref 0.0–0.2)

## 2020-09-09 LAB — PROTIME-INR
INR: 1.6 — ABNORMAL HIGH (ref 0.8–1.2)
Prothrombin Time: 18.6 seconds — ABNORMAL HIGH (ref 11.4–15.2)

## 2020-09-09 MED ORDER — OXYCODONE-ACETAMINOPHEN 7.5-325 MG PO TABS
1.0000 | ORAL_TABLET | Freq: Four times a day (QID) | ORAL | Status: DC | PRN
  Administered 2020-09-09 – 2020-09-13 (×9): 1 via ORAL
  Filled 2020-09-09 (×10): qty 1

## 2020-09-09 MED ORDER — ACETAMINOPHEN 325 MG PO TABS: 650.0000 mg | ORAL_TABLET | ORAL | Status: DC | PRN

## 2020-09-09 MED ORDER — DILTIAZEM HCL ER COATED BEADS 120 MG PO CP24
120.0000 mg | ORAL_CAPSULE | Freq: Every day | ORAL | Status: DC
  Administered 2020-09-09: 120 mg via ORAL
  Filled 2020-09-09: qty 1

## 2020-09-09 MED ORDER — ZINC OXIDE 40 % EX OINT
1.0000 "application " | TOPICAL_OINTMENT | Freq: Two times a day (BID) | CUTANEOUS | Status: DC
  Administered 2020-09-09 – 2020-09-12 (×7): 1 via TOPICAL
  Filled 2020-09-09 (×2): qty 113

## 2020-09-09 MED ORDER — HYDROCOD POLST-CPM POLST ER 10-8 MG/5ML PO SUER: 5.0000 mL | Freq: Two times a day (BID) | ORAL | Status: DC | PRN

## 2020-09-09 MED ORDER — METOPROLOL SUCCINATE ER 100 MG PO TB24
100.0000 mg | ORAL_TABLET | Freq: Every day | ORAL | Status: DC
  Administered 2020-09-09: 100 mg via ORAL
  Filled 2020-09-09: qty 1

## 2020-09-09 MED ORDER — WARFARIN SODIUM 7.5 MG PO TABS
7.5000 mg | ORAL_TABLET | Freq: Once | ORAL | Status: AC
  Administered 2020-09-09: 7.5 mg via ORAL
  Filled 2020-09-09: qty 1

## 2020-09-09 NOTE — Progress Notes (Signed)
When patient arrived to floor, PIV placed in ED was leaking/bleeding.  Charge nurse attempted to start another PIV and was unsuccessful.  IV nurse notified.  IV nurse able to place PIV with ultrasound.  Within 15 minutes of placement, patient turned over in sleep and accidentally dislodged PIV.  Patient states he does not want any more PIVs placed at present, but will reconsider in morning.  Dr. Para March notified by text.

## 2020-09-09 NOTE — Plan of Care (Signed)
PMT note:  Discussing notes for current APS level of involvement with TOC. Patient is able to make his own decisions at this time. Will follow up with patient for GOC.

## 2020-09-09 NOTE — Progress Notes (Signed)
Patient ID: [name] [name], male   DOB: 1960/02/21, 60 y.o.   MRN: [id number] [facility] PROGRESS NOTE  [name] [name] GEX:[id number] DOB: 1960/11/25 DOA: 09/07/2020 PCP: [name] Cords, DO  HPI/Subjective: Patient stating he is breathing a little bit better today.  No further chest pain.  Urinating well.  Readmitted 09/07/2020 with shortness of breath and found to be in rapid atrial fibrillation.  Objective: Vitals:   09/09/20 0824 09/09/20 1211  BP: (!) 116/52 127/65  Pulse: 74 65  Resp: 17 16  Temp: 97.9 F (36.6 C) 99.1 F (37.3 C)  SpO2: 100% 99%    Intake/Output Summary (Last 24 hours) at 09/09/2020 1802 Last data filed at 09/09/2020 1700 Gross per 24 hour  Intake 2040 ml  Output 1275 ml  Net 765 ml   Filed Weights   09/07/20 1130 09/08/20 2158 09/09/20 0617  Weight: (!) 172.4 kg (!) 171.1 kg (!) 170.8 kg    ROS: Review of Systems  Respiratory:  Positive for cough and shortness of breath.   Cardiovascular:  Negative for chest pain.  Gastrointestinal:  Negative for abdominal pain, nausea and vomiting.  Exam: Physical Exam HENT:     Head: Normocephalic.     Mouth/Throat:     Pharynx: No oropharyngeal exudate.  Eyes:     General: Lids are normal.     Conjunctiva/sclera: Conjunctivae normal.  Cardiovascular:     Rate and Rhythm: Normal rate. Rhythm irregularly irregular.     Heart sounds: Normal heart sounds, S1 normal and S2 normal.  Pulmonary:     Breath sounds: Examination of the right-lower field reveals decreased breath sounds and rales. Examination of the left-lower field reveals decreased breath sounds and rales. Decreased breath sounds and rales present. No wheezing or rhonchi.  Musculoskeletal:     Right lower leg: Swelling present.     Left lower leg: Swelling present.  Skin:    General: Skin is warm.     Findings: No rash.     Comments: Chronic lower extremity skin discoloration  Neurological:     Mental Status: He is  alert and oriented to person, place, and time.     Motor: No tremor.     Data Reviewed: Basic Metabolic Panel: Recent Labs  Lab 09/03/20 0511 09/04/20 0518 09/05/20 0508 09/07/20 1737 09/08/20 2137  NA 134* 137 138 137 134*  K 4.1 3.9 3.9 3.0* 4.1  CL 87* 85* 83* 83* 84*  CO2 37* 41* 43* 44* 41*  GLUCOSE 241* 182* 185* 139* 151*  BUN 27* 30* 29* 18 17  CREATININE 0.77 0.87 0.67 0.73 0.92  CALCIUM 8.6* 8.8* 8.5* 8.5* 9.0  MG 1.4*  --   --  2.0 1.9  PHOS  --   --   --   --  4.7*   Liver Function Tests: Recent Labs  Lab 09/03/20 0511 09/04/20 0518 09/07/20 1737 09/08/20 2137  AST 29 22 42* 26  ALT 34 28 66* 47*  ALKPHOS 68 67 60 60  BILITOT 0.4 0.5 0.9 0.8  PROT 6.4* 7.0 6.4* 6.3*  ALBUMIN 2.9* 3.2* 3.2* 3.2*   CBC: Recent Labs  Lab 09/03/20 0511 09/04/20 0518 09/07/20 1737 09/09/20 0645  WBC 8.0 6.4 7.2 5.2  NEUTROABS 7.4 5.4  --   --   HGB 11.2* 12.2* 12.7* 13.6  HCT 35.4* 38.1* 38.4* 44.1  MCV 94.9 94.1 93.7 96.5  PLT 140* 166 206 222   BNP (last 3 results) Recent Labs  07/02/20 1433 08/30/20 0819 09/07/20 1736  BNP 88.2 442.3* 160.0*    CBG: Recent Labs  Lab 09/04/20 0932 09/04/20 1133 09/04/20 1639 09/04/20 1940 09/05/20 0829  GLUCAP 155* 124* 248* 178* 198*    Recent Results (from the past 240 hour(s))  MRSA Next Gen by PCR, Nasal     Status: Abnormal   Collection Time: 09/01/20 12:52 AM   Specimen: Nasal Mucosa; Nasal Swab  Result Value Ref Range Status   MRSA by PCR Next Gen DETECTED (A) NOT DETECTED Final    Comment: RESULT CALLED TO, READ BACK BY AND VERIFIED WITH: KERRI STIPANOV @ 023 09/01/20 LFD (NOTE) The GeneXpert MRSA Assay (FDA approved for NASAL specimens only), is one component of a comprehensive MRSA colonization surveillance program. It is not intended to diagnose MRSA infection nor to guide or monitor treatment for MRSA infections. Test performance is not FDA approved in patients less than 60  years old. Performed at [facility], [address] [place], [place] [address]        Scheduled Meds:  allopurinol  100 mg Oral Daily   aspirin EC  81 mg Oral Daily   atorvastatin  40 mg Oral Daily   colchicine  0.6 mg Oral Daily   diclofenac Sodium  4 g Topical QID   diltiazem  120 mg Oral Daily   DULoxetine  20 mg Oral Daily   enoxaparin (LOVENOX) injection  1 mg/kg Subcutaneous Q12H   folic acid  1 mg Oral Daily   furosemide  40 mg Intravenous BID   levothyroxine  75 mcg Oral QAC breakfast   lisinopril  2.5 mg Oral Daily   liver oil-zinc oxide  1 application Topical BID   LORazepam  0-4 mg Intravenous Q6H   Followed by   Melene Muller ON 09/10/2020] LORazepam  0-4 mg Intravenous Q12H   methocarbamol  750 mg Oral TID   metoprolol succinate  100 mg Oral Daily   multivitamin with minerals  1 tablet Oral Daily   potassium chloride  40 mEq Oral BID   sodium chloride flush  3 mL Intravenous Q12H   thiamine  100 mg Oral Daily   Or   thiamine  100 mg Intravenous Daily   traZODone  150 mg Oral QHS   Warfarin - Pharmacist Dosing Inpatient   Does not apply q1600   Continuous Infusions:  sodium chloride     Brief history.  60 year old man with multiple medical issues including acute on chronic systolic congestive heart failure, atrial fibrillation, chronic hypoxic respiratory failure on 3 L, COPD, obstructive sleep apnea, hypothyroidism and morbid obesity readmitted to the hospital with shortness of breath and found to be in rapid atrial fibrillation.  Started on IV Lasix for acute on chronic systolic congestive heart failure.  Patient with alcohol abuse and recent COVID-19 infection.  Assessment/Plan:  Acute on chronic systolic congestive heart failure.  Continue IV Lasix twice a day and potassium supplementation.  Patient on metoprolol, lisinopril. Chronic atrial fibrillation with rapid ventricular response on presentation.  Able to come off Cardizem drip yesterday.  Started  on oral Cardizem.  Continue metoprolol. Hypokalemia continue to replace potassium with IV Lasix Subtherapeutic INR.  INR up to 1.6 today.  Pharmacy dosing Coumadin.  Lovenox injections. Elevated troponin secondary to demand ischemia from rapid atrial fibrillation Recent COVID-19 infection.  Completed 5 days of remdesivir.  Can complete 10 days of isolation.  Should be able to come out of isolation tomorrow. Alcohol abuse.  Alcohol  withdrawal protocol.  Spoke with sister on the phone yesterday states that every time he goes home he starts drinking alcohol to treat pain. Chronic osteoarthritis on Voltaren gel Hypothyroidism unspecified on levothyroxine Depression on Cymbalta and trazodone Repeated hospitalizations.  I ordered palliative care consultation        Code Status:     Code Status Orders  (From admission, onward)           Start     Ordered   09/07/20 2152  Full code  Continuous        09/07/20 2152           Code Status History     Date Active Date Inactive Code Status Order ID Comments User Context   08/30/2020 1008 09/05/2020 1907 Full Code [id number]  Lorretta Harp, MD ED   06/08/2020 1434 06/29/2020 1802 DNR [id number]  Cox, Amy N, DO ED   05/04/2020 0547 05/16/2020 2156 Full Code [id number]  Andris Baumann, MD ED   04/30/2020 1120 05/02/2020 1830 Full Code [id number]  Lucile Shutters, MD ED   01/27/2020 0439 02/02/2020 2042 Full Code [id number]  Andris Baumann, MD ED   01/07/2020 1855 01/12/2020 1948 Full Code [id number]  Cox, Amy N, DO ED   12/22/2019 1631 12/28/2019 2132 Partial Code [id number]  Cox, Amy N, DO ED   11/07/2019 1928 11/10/2019 1942 Full Code [id number]  Lorretta Harp, MD ED   08/31/2019 1614 09/02/2019 0231 Full Code [id number]  Shaune Pollack, MD ED   08/09/2019 0621 08/14/2019 2012 Full Code [id number]  Andris Baumann, MD ED   06/22/2019 1531 06/26/2019 1742 Full Code [id number]  Lorretta Harp, MD Inpatient   03/04/2019 2314 03/05/2019 2105 Full Code [id number]  Anselm Jungling, DO ED   10/24/2018 0420 10/28/2018 1849 Full Code [id number]  Oralia Manis, MD Inpatient   08/06/2018 1815 08/10/2018 2042 Full Code [id number]  Alford Highland, MD ED   07/18/2018 0716 07/23/2018 1827 Full Code [id number]  Arnaldo Natal, MD ED   05/29/2018 1453 06/01/2018 1931 Full Code [id number]  Bertrum Sol, MD Inpatient   03/17/2018 0042 03/25/2018 1554 Full Code [id number]  Altamese Dilling, MD ED   02/24/2018 0725 02/27/2018 1555 Full Code [id number]  Oralia Manis, MD Inpatient   01/27/2018 1345 01/29/2018 1406 Full Code [id number]  Alford Highland, MD ED   01/17/2018 1330 01/19/2018 1818 Partial Code [id number]  Milagros Loll, MD Inpatient   01/16/2018 1427 01/17/2018 1330 Full Code [id number]  Auburn Bilberry, MD ED      Disposition Plan: Status is: Inpatient  Dispo: The patient is from: Home              Anticipated d/c is to: Home              Patient currently improved from presentation.  Will need another day or so overdiuresis.   Difficult to place patient.  No.  Time spent: 27 minutes  [employer]

## 2020-09-09 NOTE — Progress Notes (Signed)
PT Cancellation Note  Patient Details Name: [name] [name] MRN: [id number] DOB: May 02, 1960   Cancelled Treatment:    Reason Eval/Treat Not Completed: New orders for PT received. Spoke to patient and pt again declined to participate with PT services this admission.  Will complete PT orders but will attempt to see pt at a future date/time as medically appropriate if patient changes his mind upon receipt of new PT orders.     Ovidio Hanger PT, DPT 09/09/20, 12:33 PM

## 2020-09-09 NOTE — Progress Notes (Signed)
ANTICOAGULATION CONSULT NOTE   Pharmacy Consult for Warfarin and lovenox bridge Indication: atrial fibrillation  No Known Allergies  Patient Measurements: Height: 5\' 8"  (172.7 cm) Weight: (!) 170.8 kg (376 lb 9.6 oz) IBW/kg (Calculated) : 68.4 Heparin Dosing Weight:   Vital Signs: Temp: 97.9 F (36.6 C) (07/26 0824) Temp Source: Oral (07/26 0824) BP: 116/52 (07/26 0824) Pulse Rate: 74 (07/26 0824)  Labs: Recent Labs    09/07/20 1504 09/07/20 1737 09/08/20 0735 09/08/20 2137 09/09/20 0645  HGB  --  12.7*  --   --  13.6  HCT  --  38.4*  --   --  44.1  PLT  --  206  --   --  222  LABPROT  --  22.1* 17.6*  --  18.6*  INR  --  1.9* 1.4*  --  1.6*  CREATININE  --  0.73  --  0.92  --   TROPONINIHS 93* 94*  --   --   --      Estimated Creatinine Clearance: 132.1 mL/min (by C-G formula based on SCr of 0.92 mg/dL).   Medical History: Past Medical History:  Diagnosis Date   Allergy    Anxiety    Arthritis    Asthma    Brain damage    Chronic pain of both knees 07/13/2018   Clotting disorder (HCC)    COPD (chronic obstructive pulmonary disease) (HCC)    Depression    GERD (gastroesophageal reflux disease)    HFrEF (heart failure with reduced ejection fraction) (HCC)    a. 03/2018 Echo: EF 25-30%, diff HK. Mod LAE.   History of DVT (deep vein thrombosis)    History of pulmonary embolism    a. Chronic coumadin.   Hypertension    MI (myocardial infarction) (HCC)    Morbid obesity (HCC)    Neck pain 07/13/2018   NICM (nonischemic cardiomyopathy) (HCC)    a. s/p Cath x 3 - reportedly nl cors. Last cath 2019 in [place]; b. a. 03/2018 Echo: EF 25-30%, diff HK.   Persistent atrial fibrillation (HCC)    a. 03/2018 s/p DCCV; b. CHA2DS2VASc = 1-->Xarelto (later changed to warfarin); c. 05/2018 recurrent afib-->Amio initiated.   Sleep apnea    Sleep apnea     Medications:  Medications Prior to Admission  Medication Sig Dispense Refill Last Dose   allopurinol (ZYLOPRIM) 100 MG  tablet Take 1 tablet (100 mg total) by mouth daily. 30 tablet 0 09/06/2020   aspirin EC 81 MG tablet Take 81 mg by mouth daily. Swallow whole.   09/06/2020   atorvastatin (LIPITOR) 40 MG tablet TAKE 1 TABLET BY MOUTH ONCE DAILY 90 tablet 1 09/06/2020   colchicine 0.6 MG tablet Take 1 tablet (0.6 mg total) by mouth daily. 30 tablet 0 09/06/2020   diclofenac Sodium (VOLTAREN) 1 % GEL Apply topically.   prn at prn   DULoxetine (CYMBALTA) 20 MG capsule Take 1 capsule (20 mg total) by mouth daily. 90 capsule 1 09/06/2020   guaiFENesin-dextromethorphan (ROBITUSSIN DM) 100-10 MG/5ML syrup Take 5 mLs by mouth every 4 (four) hours as needed for cough. 118 mL 0 prn at prn   levothyroxine (SYNTHROID) 75 MCG tablet Take 1 tablet (75 mcg total) by mouth daily before breakfast. 30 tablet 3 09/06/2020   lisinopril (ZESTRIL) 2.5 MG tablet Take 2.5 mg by mouth daily.   09/06/2020   meclizine (ANTIVERT) 25 MG tablet Take 0.5-1 tablets (12.5-25 mg total) by mouth 3 (three) times daily as needed for  dizziness. 60 tablet 2 prn at prn   methocarbamol (ROBAXIN) 750 MG tablet Take 750 mg by mouth 3 (three) times daily.   09/06/2020   metoprolol succinate (TOPROL-XL) 100 MG 24 hr tablet Take 100 mg by mouth daily.   09/06/2020   montelukast (SINGULAIR) 10 MG tablet Take 1 tablet (10 mg total) by mouth at bedtime. 30 tablet 11 09/06/2020   Multiple Vitamin (MULTIVITAMIN WITH MINERALS) TABS tablet Take 1 tablet by mouth daily.   09/06/2020   naphazoline-glycerin (CLEAR EYES REDNESS) 0.012-0.2 % SOLN Place 1-2 drops into both eyes 4 (four) times daily as needed for eye irritation. 15 mL 0 prn at prn   nitroGLYCERIN (NITROSTAT) 0.4 MG SL tablet Place 1 tablet under tongue every 5 minutes as needed for chest pain. (No more than 3 doses within 15 minutes) 15 tablet 3 prn at prn   oxyCODONE-acetaminophen (PERCOCET) 7.5-325 MG tablet Take 1 tablet by mouth every 8 (eight) hours as needed for up to 5 days for severe pain. 15 tablet 0 prn at prn    potassium chloride SA (KLOR-CON) 20 MEQ tablet Take 1 tablet (20 mEq total) by mouth daily. 30 tablet 3 09/06/2020   SUMAtriptan (IMITREX) 50 MG tablet Take 50 mg by mouth every 2 (two) hours as needed for migraine.   prn at prn   torsemide 40 MG TABS Take 80 mg by mouth 2 (two) times daily. 120 tablet 0 09/06/2020   traZODone (DESYREL) 150 MG tablet Take 1 tablet (150 mg total) by mouth at bedtime. 90 tablet 1 09/06/2020   warfarin (COUMADIN) 5 MG tablet Take 5 mg by mouth daily.   09/06/2020   albuterol (VENTOLIN HFA) 108 (90 Base) MCG/ACT inhaler Inhale 2 puffs into the lungs every 4 (four) hours as needed for wheezing or shortness of breath. 8 g 2 prn at prn    Assessment: 60 y.o. male with medical history significant of dCHF, hypertension, hyperlipidemia, diet-controlled diabetes, COPD, hypothyroidism, gout, depression, anxiety, OSA on CPAP, atrial fibrillation, PE/DVT on Coumadin, CAD, alcohol abuse, obesity, who presents with shortness of breath w/u in ED revealed Afib w RVR. Pt has a history of being subtherapeutic on warfarin and had been switched to Eliquis due to non-compliance with INR checks, however, now pt is back on warfarin. Pharmacy consulted to dose warfarin for Afib w/ h/o DVT/PE . Pt was on Warfarin 5 mg PO daily PTA.   Pt is from nursing home so most likely had a dose of warfarin on 7/24.    Medications: Pharmacy technician spoke with pt who confirmed pt takes warfarin 5 mg daily and his last dose was 7/23.    Date    INR      Dose/Comment 7/16     1.1       7.5 mg 7/17     1.4       7.5 mg 7/18     1.8       5 mg (0 >100% meals eaten) 7/19     2.5       5 mg     7/20     2.3       5 mg 7/21     2.3       5 mg --------- 7/23 --- 5mg  PTA 7/24 1.9 (2.5mg  ordered but not given?) 7/25 1.4 7.5mg  (lovenox bridge started) 7/26 1.6 7.5 mg    Goal of Therapy:  INR 2-3   Plan:  Lovenox Plan: Will bridge with  lovenox until INR therapeutic >2. Pt wt <180kg so no lab  monitoring of anti-Xa req'd. Lovenox 1mg /kg q12h  Monitor daily CBC; trend PLTs.  Warfarin Plan: INR is subtherapeutic but slightly trending up. Will give warfarin 7.5 mg x 1 tonight. Pt is consistently subtherapeutic when admitted. May need to be discharged on a high warfairn dose. Pt has a large body weight, which may require a larger dose of warfarin. Daily INR ordered. CBC at least every 3 days.    , PharmD  09/09/2020,10:18 AM

## 2020-09-09 NOTE — Consult Note (Addendum)
WOC Nurse Consult Note: Pt is in isolation for Covid and is familiar to [facility] team from multiple previous admissions.  Most recent admission was 7/18; refer to previous progress notes. He wore bilat Una boots in the past but these are no longer indicated; no weeping or open wounds to bilat caves or feet. Wound consult requested for left shin, but I did not see a wound upon assessment and the patient is not aware of any wounds to legs, he states. Reason for Consult: Consult requested for bilat buttocks/perineum/posterior upper thighs. Pt is incontinent of urine at times and has multiple scattered areas of red, moist, round, "punched out in appearance" full thickness wounds to the locations listed above.  Appearance is consistent with moisture associated skin damage.  Previously noted intertriginous areas to abd folds and also moisture associated skin damage to perineum appear to have resolved when those locations were assessed.  Dressing procedure/placement/frequency: Topical treatment orders provided for bedside nurses to perform as follows to repel moisture and promote healing: Apply Desitin to bilat buttocks/posterior upper thighs BID and PRN when turning or cleaning. Please re-consult if further assistance is needed.  Thank-you,  Cammie Mcgee MSN, RN, CWOCN, [place], CNS [phone]

## 2020-09-09 NOTE — Plan of Care (Signed)
  Problem: Health Behavior/Discharge Planning: Goal: Ability to manage health-related needs will improve Outcome: Progressing   Problem: Clinical Measurements: Goal: Ability to maintain clinical measurements within normal limits will improve Outcome: Progressing   Problem: Activity: Goal: Risk for activity intolerance will decrease Outcome: Progressing   

## 2020-09-09 NOTE — TOC Progression Note (Signed)
Transition of Care [facility]) - Progression Note    Patient Details  Name: [name] [name] MRN: [id number] Date of Birth: 04/06/1960  Transition of [facility]) CM/SW Contact  Gildardo Griffes, [place] Phone Number: 09/09/2020, 9:11 AM  Clinical Narrative:     CSW received call from Diona Fanti with APS at [phone], reports she would like to be updated on dc plan once that's established for patient. CSW updated her that patient recently refused to work with PT and OT this admission, dc plan uncertain at this time. Hx of patient wanting to return home.   TOC will continue to follow for discharge planning.        Expected Discharge Plan and Services                                                 Social Determinants of Health (SDOH) Interventions    Readmission Risk Interventions Readmission Risk Prevention Plan 01/11/2020 12/24/2019 01/25/2019  Transportation Screening Complete Complete Complete  PCP or Specialist Appt within 3-5 Days - - -  HRI or Home Care Consult - - -  Social Work Consult for Recovery Care Planning/Counseling - - -  Palliative Care Screening - - -  Medication Review [employer]) Complete Complete Complete  PCP or Specialist appointment within 3-5 days of discharge Complete Complete Complete  HRI or Home Care Consult - - Complete  SW Recovery Care/Counseling Consult Complete Complete Complete  Palliative Care Screening Complete Not Applicable Not Applicable  Skilled Nursing Facility Not Applicable Not Applicable Not Applicable

## 2020-09-10 DIAGNOSIS — Z515 Encounter for palliative care: Secondary | ICD-10-CM

## 2020-09-10 DIAGNOSIS — I5023 Acute on chronic systolic (congestive) heart failure: Secondary | ICD-10-CM

## 2020-09-10 DIAGNOSIS — Z7189 Other specified counseling: Secondary | ICD-10-CM

## 2020-09-10 LAB — BASIC METABOLIC PANEL
Anion gap: 10 (ref 5–15)
BUN: 25 mg/dL — ABNORMAL HIGH (ref 6–20)
CO2: 39 mmol/L — ABNORMAL HIGH (ref 22–32)
Calcium: 9.3 mg/dL (ref 8.9–10.3)
Chloride: 84 mmol/L — ABNORMAL LOW (ref 98–111)
Creatinine, Ser: 1.27 mg/dL — ABNORMAL HIGH (ref 0.61–1.24)
GFR, Estimated: >60 mL/min (ref >60)
Glucose, Bld: 94 mg/dL (ref 70–99)
Potassium: 5 mmol/L (ref 3.5–5.1)
Sodium: 133 mmol/L — ABNORMAL LOW (ref 135–145)

## 2020-09-10 LAB — PROTIME-INR
INR: 2.2 — ABNORMAL HIGH (ref 0.8–1.2)
Prothrombin Time: 24.2 seconds — ABNORMAL HIGH (ref 11.4–15.2)

## 2020-09-10 MED ORDER — METOPROLOL TARTRATE 25 MG PO TABS
12.5000 mg | ORAL_TABLET | Freq: Two times a day (BID) | ORAL | Status: DC
  Administered 2020-09-10: 12.5 mg via ORAL
  Filled 2020-09-10 (×3): qty 1

## 2020-09-10 MED ORDER — ALPRAZOLAM 0.5 MG PO TABS
0.5000 mg | ORAL_TABLET | Freq: Two times a day (BID) | ORAL | Status: DC | PRN
  Administered 2020-09-10 – 2020-09-11 (×2): 0.5 mg via ORAL
  Filled 2020-09-10 (×2): qty 1

## 2020-09-10 MED ORDER — WARFARIN SODIUM 6 MG PO TABS
6.0000 mg | ORAL_TABLET | Freq: Once | ORAL | Status: AC
  Administered 2020-09-10: 6 mg via ORAL
  Filled 2020-09-10: qty 1

## 2020-09-10 NOTE — Progress Notes (Signed)
PROGRESS NOTE  [name] [name]  DOB: [birth year]  PCP: [name], DO MWN:[id number]  DOA: 09/07/2020  LOS: 2 days  Hospital Day: 4   Chief Complaint  Patient presents with   Chest Pain    Brief narrative: [name] [name] is a 60 y.o. male with PMH significant for morbid obesity, sleep apnea, chronic alcohol use, nonischemic cardiomyopathy, chronic systolic CHF, HTN, COPD, DVT/PE, A. fib on Coumadin, hypothyroidism, chronic pain and recent COVID-19 infection. Patient presented to the ED on 7/24 with shortness of breath, found to be in rapid A. fib and acute decompensated CHF. Patient was admitted to hospitalist service, started on Lasix drip. See below for details  Subjective: Patient was seen and examined this morning. Morbidly obese elderly Caucasian male.  Sitting up in chair.  Not in distress. Patient understands he has a lot of comorbidities including severe CHF, osteoarthritis, chronic pain, morbid obesity.  He denies regularly using alcohol. Would like something to control stress.  Assessment/Plan: Acute decompensation of chronic systolic CHF Essential hypertension -Home meds include Toprol 100 mg daily, torsemide 80 mg twice daily, lisinopril 2.5 mg daily -Echo from February 2022 had shown an EF of 30 to 35%.  7/16, echocardiogram showed severely decreased LV function with moderately dilated LV, unable to assess EF, severely reduced RV function -Patient is currently on Lasix 40 mg IV twice daily, Toprol 100 mg daily, Cardizem 120 mg daily, lisinopril 2.5 mg daily. -Blood pressure running low in 90s this morning.  He also has episodes of bradycardia.  Creatinine is elevated as well -I would switch him to metoprolol tartrate at 12.5 mg twice daily, stop lisinopril. Hold Lasix for today.   -Net IO Since Admission: -1,510.5 mL [09/10/20 1039] -Continue to monitor for daily intake output, weight, blood pressure, BNP, renal function and  electrolytes. -I would also get cardiology consultation. Recent Labs  Lab 09/04/20 0518 09/05/20 0508 09/07/20 1736 09/07/20 1737 09/08/20 2137 09/10/20 0638  BNP  --   --  160.0*  --   --   --   BUN 30* 29*  --  18 17 25*  CREATININE 0.87 0.67  --  0.73 0.92 1.27*  K 3.9 3.9  --  3.0* 4.1 5.0  MG  --   --   --  2.0 1.9  --    Hypokalemia -Continue to monitor while on Lasix.  I would stop potassium supplement at this time as potassium level is acutely rising.  Permanent A. Fib RVR at presentation -Initially started on Cardizem drip.  He was later switched to oral Cardizem.  In the face of poor ejection fraction and intermittent bradycardia, I will stop Cardizem at this time. -Continue metoprolol at reduced dose. -Continue Coumadin to target INR 2-3. Recent Labs  Lab 09/05/20 0508 09/07/20 1737 09/08/20 0735 09/09/20 0645 09/10/20 0638  INR 2.5* 1.9* 1.4* 1.6* 2.2*   Elevated troponin History of nonischemic cardiomyopathy -Troponin elevated likely because of RVR and decompensated CHF.  Per report, cardiac cath in recent past had shown normal coronaries. -Continue aspirin and statin. Recent Labs    09/07/20 1504 09/07/20 1737  TROPONINIHS 93* 94*   Chronic alcohol abuse -Per patient's sister, patient continues to drink 2 get rid of chronic pain -Currently on alcohol withdrawal protocol  Chronic pain Depression -Continue Robaxin, as needed oxycodone, Cymbalta, trazodone -We will add Xanax 0.5 mg twice daily to control stress.  Recent COVID-19 infection -Completed 5 days of remdesivir.  -Off isolation from today 7/27  Morbid obesity  -Body mass index is 58.27 kg/m. Patient has been advised to make an attempt to improve diet and exercise patterns to aid in weight loss.  Obstructive sleep apnea -Continue nightly CPAP.  Hypothyroidism -Continue Synthroid  Recurrent hospitalization -Palliative care consultation appreciated  Mobility: Limited mobility  because of severe osteoarthritis.  But he uses a walker to get around.  PT eval appreciated Code Status:   Code Status: Full Code  Nutritional status: Body mass index is 58.27 kg/m.     Diet:  Diet Order             Diet Heart Room service appropriate? Yes; Fluid consistency: Thin; Fluid restriction: 1200 mL Fluid  Diet effective now                  DVT prophylaxis:  SCDs Start: 09/07/20 2152 warfarin (COUMADIN) tablet 6 mg   Antimicrobials: None Fluid: None Consultants: Cardiology Family Communication: None  Status is: Inpatient  Remains inpatient appropriate because: Needs further medication management for severe CHF  Dispo: The patient is from: Home              Anticipated d/c is to: Hopefully home with home health services              Patient currently is not medically stable to d/c.   Difficult to place patient No     Infusions:   sodium chloride      Scheduled Meds:  allopurinol  100 mg Oral Daily   aspirin EC  81 mg Oral Daily   atorvastatin  40 mg Oral Daily   diclofenac Sodium  4 g Topical QID   DULoxetine  20 mg Oral Daily   folic acid  1 mg Oral Daily   levothyroxine  75 mcg Oral QAC breakfast   liver oil-zinc oxide  1 application Topical BID   LORazepam  0-4 mg Intravenous Q6H   Followed by   LORazepam  0-4 mg Intravenous Q12H   methocarbamol  750 mg Oral TID   metoprolol tartrate  12.5 mg Oral BID   multivitamin with minerals  1 tablet Oral Daily   sodium chloride flush  3 mL Intravenous Q12H   thiamine  100 mg Oral Daily   Or   thiamine  100 mg Intravenous Daily   traZODone  150 mg Oral QHS   warfarin  6 mg Oral ONCE-1600   Warfarin - Pharmacist Dosing Inpatient   Does not apply q1600    Antimicrobials: Anti-infectives (From admission, onward)    None       PRN meds: sodium chloride, acetaminophen, ALPRAZolam, chlorpheniramine-HYDROcodone, LORazepam **OR** LORazepam, meclizine, nitroGLYCERIN, ondansetron (ZOFRAN) IV,  oxyCODONE-acetaminophen, sodium chloride flush   Objective: Vitals:   09/10/20 0608 09/10/20 0822  BP: (!) 101/57 (!) 94/45  Pulse: (!) 54 75  Resp: 18 18  Temp: 98.1 F (36.7 C) 98 F (36.7 C)  SpO2: 100% 100%    Intake/Output Summary (Last 24 hours) at 09/10/2020 1039 Last data filed at 09/10/2020 1000 Gross per 24 hour  Intake 1280 ml  Output 800 ml  Net 480 ml   Filed Weights   09/08/20 2158 09/09/20 0617 09/10/20 0122  Weight: (!) 171.1 kg (!) 170.8 kg (!) 173.8 kg   Weight change: 2.767 kg Body mass index is 58.27 kg/m.   Physical Exam: General exam: Pleasant elderly middle-aged morbidly obese Caucasian male.  Looks older for his age Skin: No rashes, lesions or ulcers. HEENT: Atraumatic, normocephalic,  no obvious bleeding Lungs: Clear to auscultation bilaterally CVS: Regular rate and rhythm, no murmur GI/Abd soft, distended from obesity, nontender, bowel sound present CNS: Alert, awake and oriented x3 Psychiatry: Mood appropriate Extremities: Bilateral pedal edema seems to be improving.  Skin wrinkling seen in both legs.  Data Review: I have personally reviewed the laboratory data and studies available.  Recent Labs  Lab 09/04/20 0518 09/07/20 1737 09/09/20 0645  WBC 6.4 7.2 5.2  NEUTROABS 5.4  --   --   HGB 12.2* 12.7* 13.6  HCT 38.1* 38.4* 44.1  MCV 94.1 93.7 96.5  PLT 166 206 222   Recent Labs  Lab 09/04/20 0518 09/05/20 0508 09/07/20 1737 09/08/20 2137 09/10/20 0638  NA 137 138 137 134* 133*  K 3.9 3.9 3.0* 4.1 5.0  CL 85* 83* 83* 84* 84*  CO2 41* 43* 44* 41* 39*  GLUCOSE 182* 185* 139* 151* 94  BUN 30* 29* 18 17 25*  CREATININE 0.87 0.67 0.73 0.92 1.27*  CALCIUM 8.8* 8.5* 8.5* 9.0 9.3  MG  --   --  2.0 1.9  --   PHOS  --   --   --  4.7*  --     F/u labs ordered. Unresulted Labs (From admission, onward)     Start     Ordered   09/11/20 0500  CBC with Differential/Platelet  Daily,   R     Question:  Specimen collection method   Answer:  Lab=Lab collect   09/10/20 1039   09/11/20 0500  Magnesium  Tomorrow morning,   R       Question:  Specimen collection method  Answer:  Lab=Lab collect   09/10/20 1039   09/11/20 0500  Phosphorus  Tomorrow morning,   R       Question:  Specimen collection method  Answer:  Lab=Lab collect   09/10/20 1039   09/10/20 0000  Basic metabolic panel  Daily,   R      09/09/20 1004   09/09/20 0500  Protime-INR  Daily,   STAT      09/08/20 1122            Signed, Lorin Glass, MD [facility] 09/10/2020
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
## 2020-09-10 NOTE — Progress Notes (Signed)
ANTICOAGULATION CONSULT NOTE   Pharmacy Consult for Warfarin  Indication: atrial fibrillation  No Known Allergies  Patient Measurements: Height: 5\' 8"  (172.7 cm) Weight: (!) 173.8 kg (383 lb 3.2 oz) IBW/kg (Calculated) : 68.4 Heparin Dosing Weight:   Vital Signs: Temp: 98.1 F (36.7 C) (07/27 0608) Temp Source: Oral (07/27 0608) BP: 101/57 (07/27 0608) Pulse Rate: 54 (07/27 0608)  Labs: Recent Labs    09/07/20 1504 09/07/20 1737 09/07/20 1737 09/08/20 0735 09/08/20 2137 09/09/20 0645 09/10/20 0638  HGB  --  12.7*  --   --   --  13.6  --   HCT  --  38.4*  --   --   --  44.1  --   PLT  --  206  --   --   --  222  --   LABPROT  --  22.1*   < > 17.6*  --  18.6* 24.2*  INR  --  1.9*   < > 1.4*  --  1.6* 2.2*  CREATININE  --  0.73  --   --  0.92  --  1.27*  TROPONINIHS 93* 94*  --   --   --   --   --    < > = values in this interval not displayed.     Estimated Creatinine Clearance: 96.8 mL/min (A) (by C-G formula based on SCr of 1.27 mg/dL (H)).   Medical History: Past Medical History:  Diagnosis Date   Allergy    Anxiety    Arthritis    Asthma    Brain damage    Chronic pain of both knees 07/13/2018   Clotting disorder (HCC)    COPD (chronic obstructive pulmonary disease) (HCC)    Depression    GERD (gastroesophageal reflux disease)    HFrEF (heart failure with reduced ejection fraction) (HCC)    a. 03/2018 Echo: EF 25-30%, diff HK. Mod LAE.   History of DVT (deep vein thrombosis)    History of pulmonary embolism    a. Chronic coumadin.   Hypertension    MI (myocardial infarction) (HCC)    Morbid obesity (HCC)    Neck pain 07/13/2018   NICM (nonischemic cardiomyopathy) (HCC)    a. s/p Cath x 3 - reportedly nl cors. Last cath 2019 in [place]; b. a. 03/2018 Echo: EF 25-30%, diff HK.   Persistent atrial fibrillation (HCC)    a. 03/2018 s/p DCCV; b. CHA2DS2VASc = 1-->Xarelto (later changed to warfarin); c. 05/2018 recurrent afib-->Amio initiated.   Sleep apnea     Sleep apnea     Medications:  Medications Prior to Admission  Medication Sig Dispense Refill Last Dose   allopurinol (ZYLOPRIM) 100 MG tablet Take 1 tablet (100 mg total) by mouth daily. 30 tablet 0 09/06/2020   aspirin EC 81 MG tablet Take 81 mg by mouth daily. Swallow whole.   09/06/2020   atorvastatin (LIPITOR) 40 MG tablet TAKE 1 TABLET BY MOUTH ONCE DAILY 90 tablet 1 09/06/2020   colchicine 0.6 MG tablet Take 1 tablet (0.6 mg total) by mouth daily. 30 tablet 0 09/06/2020   diclofenac Sodium (VOLTAREN) 1 % GEL Apply topically.   prn at prn   DULoxetine (CYMBALTA) 20 MG capsule Take 1 capsule (20 mg total) by mouth daily. 90 capsule 1 09/06/2020   guaiFENesin-dextromethorphan (ROBITUSSIN DM) 100-10 MG/5ML syrup Take 5 mLs by mouth every 4 (four) hours as needed for cough. 118 mL 0 prn at prn   levothyroxine (SYNTHROID) 75 MCG tablet  Take 1 tablet (75 mcg total) by mouth daily before breakfast. 30 tablet 3 09/06/2020   lisinopril (ZESTRIL) 2.5 MG tablet Take 2.5 mg by mouth daily.   09/06/2020   meclizine (ANTIVERT) 25 MG tablet Take 0.5-1 tablets (12.5-25 mg total) by mouth 3 (three) times daily as needed for dizziness. 60 tablet 2 prn at prn   methocarbamol (ROBAXIN) 750 MG tablet Take 750 mg by mouth 3 (three) times daily.   09/06/2020   metoprolol succinate (TOPROL-XL) 100 MG 24 hr tablet Take 100 mg by mouth daily.   09/06/2020   montelukast (SINGULAIR) 10 MG tablet Take 1 tablet (10 mg total) by mouth at bedtime. 30 tablet 11 09/06/2020   Multiple Vitamin (MULTIVITAMIN WITH MINERALS) TABS tablet Take 1 tablet by mouth daily.   09/06/2020   naphazoline-glycerin (CLEAR EYES REDNESS) 0.012-0.2 % SOLN Place 1-2 drops into both eyes 4 (four) times daily as needed for eye irritation. 15 mL 0 prn at prn   nitroGLYCERIN (NITROSTAT) 0.4 MG SL tablet Place 1 tablet under tongue every 5 minutes as needed for chest pain. (No more than 3 doses within 15 minutes) 15 tablet 3 prn at prn   oxyCODONE-acetaminophen  (PERCOCET) 7.5-325 MG tablet Take 1 tablet by mouth every 8 (eight) hours as needed for up to 5 days for severe pain. 15 tablet 0 prn at prn   potassium chloride SA (KLOR-CON) 20 MEQ tablet Take 1 tablet (20 mEq total) by mouth daily. 30 tablet 3 09/06/2020   SUMAtriptan (IMITREX) 50 MG tablet Take 50 mg by mouth every 2 (two) hours as needed for migraine.   prn at prn   torsemide 40 MG TABS Take 80 mg by mouth 2 (two) times daily. 120 tablet 0 09/06/2020   traZODone (DESYREL) 150 MG tablet Take 1 tablet (150 mg total) by mouth at bedtime. 90 tablet 1 09/06/2020   warfarin (COUMADIN) 5 MG tablet Take 5 mg by mouth daily.   09/06/2020   albuterol (VENTOLIN HFA) 108 (90 Base) MCG/ACT inhaler Inhale 2 puffs into the lungs every 4 (four) hours as needed for wheezing or shortness of breath. 8 g 2 prn at prn    Assessment: 60 y.o. male with medical history significant of dCHF, hypertension, hyperlipidemia, diet-controlled diabetes, COPD, hypothyroidism, gout, depression, anxiety, OSA on CPAP, atrial fibrillation, PE/DVT on Coumadin, CAD, alcohol abuse, obesity, who presents with shortness of breath w/u in ED revealed Afib w RVR. Pt has a history of being subtherapeutic on warfarin and had been switched to Eliquis due to non-compliance with INR checks, however, now pt is back on warfarin. Pharmacy consulted to dose warfarin for Afib w/ h/o DVT/PE . Pt was on Warfarin 5 mg PO daily PTA.   Pt is from nursing home so most likely had a dose of warfarin on 7/24.    Medications: Pharmacy technician spoke with pt who confirmed pt takes warfarin 5 mg daily and his last dose was 7/23.    Date    INR      Dose/Comment 7/16     1.1       7.5 mg 7/17     1.4       7.5 mg 7/18     1.8       5 mg (0 >100% meals eaten) 7/19     2.5       5 mg     7/20     2.3       5 mg 7/21  2.3       5 mg --------- 7/23 --- 5mg  PTA 7/24 1.9 (2.5mg  ordered but not given?) 7/25 1.4 7.5mg  (lovenox bridge started) 7/26 1.6 7.5 mg   7/27 2.2 6 mg    Goal of Therapy:  INR 2-3   Plan:  Lovenox Plan: INR > 2 - will discontinue enoxaparin.   Warfarin Plan: INR is therapeutic. Will give warfarin 6 mg x 1 tonight. Pt is consistently subtherapeutic when admitted, either patient is non-compliant with medication or the dose is not optimal. May need to be discharged on a high warfairn dose. Pt has a large body weight, which may require a larger dose of warfarin. Daily INR ordered. CBC at least every 3 days.    8/27, PharmD  09/10/2020,7:33 AM

## 2020-09-10 NOTE — Plan of Care (Signed)
  Problem: Education: Goal: Knowledge of General Education information will improve Description: Including pain rating scale, medication(s)/side effects and non-pharmacologic comfort measures 09/10/2020 1324 by Ansel Bong, RN Outcome: Progressing 09/10/2020 1324 by Ansel Bong, RN Outcome: Progressing   Problem: Health Behavior/Discharge Planning: Goal: Ability to manage health-related needs will improve 09/10/2020 1324 by Ansel Bong, RN Outcome: Progressing 09/10/2020 1324 by Ansel Bong, RN Outcome: Progressing   Problem: Clinical Measurements: Goal: Ability to maintain clinical measurements within normal limits will improve 09/10/2020 1324 by Ansel Bong, RN Outcome: Progressing 09/10/2020 1324 by Ansel Bong, RN Outcome: Progressing Goal: Will remain free from infection 09/10/2020 1324 by Ansel Bong, RN Outcome: Progressing 09/10/2020 1324 by Ansel Bong, RN Outcome: Progressing Goal: Diagnostic test results will improve 09/10/2020 1324 by Ansel Bong, RN Outcome: Progressing 09/10/2020 1324 by Ansel Bong, RN Outcome: Progressing Goal: Respiratory complications will improve 09/10/2020 1324 by Ansel Bong, RN Outcome: Progressing 09/10/2020 1324 by Ansel Bong, RN Outcome: Progressing Goal: Cardiovascular complication will be avoided 09/10/2020 1324 by Ansel Bong, RN Outcome: Progressing 09/10/2020 1324 by Ansel Bong, RN Outcome: Progressing   Problem: Activity: Goal: Risk for activity intolerance will decrease 09/10/2020 1324 by Ansel Bong, RN Outcome: Progressing 09/10/2020 1324 by Ansel Bong, RN Outcome: Progressing   Problem: Nutrition: Goal: Adequate nutrition will be maintained 09/10/2020 1324 by Ansel Bong, RN Outcome: Progressing 09/10/2020 1324 by Ansel Bong, RN Outcome: Progressing   Problem: Coping: Goal: Level of anxiety will decrease 09/10/2020 1324 by Ansel Bong, RN Outcome:  Progressing 09/10/2020 1324 by Ansel Bong, RN Outcome: Progressing   Problem: Elimination: Goal: Will not experience complications related to bowel motility 09/10/2020 1324 by Ansel Bong, RN Outcome: Progressing 09/10/2020 1324 by Ansel Bong, RN Outcome: Progressing Goal: Will not experience complications related to urinary retention 09/10/2020 1324 by Ansel Bong, RN Outcome: Progressing 09/10/2020 1324 by Ansel Bong, RN Outcome: Progressing   Problem: Pain Managment: Goal: General experience of comfort will improve 09/10/2020 1324 by Ansel Bong, RN Outcome: Progressing 09/10/2020 1324 by Ansel Bong, RN Outcome: Progressing   Problem: Safety: Goal: Ability to remain free from injury will improve 09/10/2020 1324 by Ansel Bong, RN Outcome: Progressing 09/10/2020 1324 by Ansel Bong, RN Outcome: Progressing   Problem: Skin Integrity: Goal: Risk for impaired skin integrity will decrease 09/10/2020 1324 by Ansel Bong, RN Outcome: Progressing 09/10/2020 1324 by Ansel Bong, RN Outcome: Progressing

## 2020-09-10 NOTE — Consult Note (Signed)
CARDIOLOGY CONSULT NOTE               Patient ID: [name] [name] MRN: [id number] DOB/AGE: 60-30-1962 60 y.o.  Admit date: 09/07/2020 Referring Physician Dr  Lorin GlassBinaya Dahal  Primary Physician Dr Kirtland BouchardK Primary Cardiologist Dr Gwen PoundsKowalski Reason for Consultation shortness of breath atrial fibrillation  HPI: Patient presented 60 year old morbidly obese male chronically cardiomyopathy congestive heart failure systolic dysfunction atrial fibrillation hypertension hyperlipidemia diabetes COPD chronic lymphedema anxiety obstructive sleep apnea on CPAP history of PE DVT on Coumadin.  Patient recently discharged from the hospital on 2 days ago after he requestedto exacerbate his hypertension.  Slightly confused yesterday but did not take medications today.  Patient was found to be in atrial fibrillation rapid ventricular response still on diltiazem drip for control mild palpitations no chest pain still short of breath  Review of systems complete and found to be negative unless listed above     Past Medical History:  Diagnosis Date   Allergy    Anxiety    Arthritis    Asthma    Brain damage    Chronic pain of both knees 07/13/2018   Clotting disorder (HCC)    COPD (chronic obstructive pulmonary disease) (HCC)    Depression    GERD (gastroesophageal reflux disease)    HFrEF (heart failure with reduced ejection fraction) (HCC)    a. 03/2018 Echo: EF 25-30%, diff HK. Mod LAE.   History of DVT (deep vein thrombosis)    History of pulmonary embolism    a. Chronic coumadin.   Hypertension    MI (myocardial infarction) (HCC)    Morbid obesity (HCC)    Neck pain 07/13/2018   NICM (nonischemic cardiomyopathy) (HCC)    a. s/p Cath x 3 - reportedly nl cors. Last cath 2019 in [place]; b. a. 03/2018 Echo: EF 25-30%, diff HK.   Persistent atrial fibrillation (HCC)    a. 03/2018 s/p DCCV; b. CHA2DS2VASc = 1-->Xarelto (later changed to warfarin); c. 05/2018 recurrent afib-->Amio initiated.   Sleep apnea     Sleep apnea     Past Surgical History:  Procedure Laterality Date   CARDIOVERSION N/A 03/24/2018   Procedure: CARDIOVERSION;  Surgeon: Antonieta IbaGollan, Timothy J, MD;  Location: [facility] ORS;  Service: Cardiovascular;  Laterality: N/A;   CARDIOVERSION N/A 08/08/2018   Procedure: CARDIOVERSION;  Surgeon: Yvonne KendallEnd, Christopher, MD;  Location: [facility] ORS;  Service: Cardiovascular;  Laterality: N/A;   hearnia repair     X 3- total of two surgeries   HERNIA REPAIR     LEG SURGERY      Medications Prior to Admission  Medication Sig Dispense Refill Last Dose   allopurinol (ZYLOPRIM) 100 MG tablet Take 1 tablet (100 mg total) by mouth daily. 30 tablet 0 09/06/2020   aspirin EC 81 MG tablet Take 81 mg by mouth daily. Swallow whole.   09/06/2020   atorvastatin (LIPITOR) 40 MG tablet TAKE 1 TABLET BY MOUTH ONCE DAILY 90 tablet 1 09/06/2020   colchicine 0.6 MG tablet Take 1 tablet (0.6 mg total) by mouth daily. 30 tablet 0 09/06/2020   diclofenac Sodium (VOLTAREN) 1 % GEL Apply topically.   prn at prn   DULoxetine (CYMBALTA) 20 MG capsule Take 1 capsule (20 mg total) by mouth daily. 90 capsule 1 09/06/2020   guaiFENesin-dextromethorphan (ROBITUSSIN DM) 100-10 MG/5ML syrup Take 5 mLs by mouth every 4 (four) hours as needed for cough. 118 mL 0 prn at prn   levothyroxine (SYNTHROID) 75 MCG tablet Take 1  tablet (75 mcg total) by mouth daily before breakfast. 30 tablet 3 09/06/2020   lisinopril (ZESTRIL) 2.5 MG tablet Take 2.5 mg by mouth daily.   09/06/2020   meclizine (ANTIVERT) 25 MG tablet Take 0.5-1 tablets (12.5-25 mg total) by mouth 3 (three) times daily as needed for dizziness. 60 tablet 2 prn at prn   methocarbamol (ROBAXIN) 750 MG tablet Take 750 mg by mouth 3 (three) times daily.   09/06/2020   metoprolol succinate (TOPROL-XL) 100 MG 24 hr tablet Take 100 mg by mouth daily.   09/06/2020   montelukast (SINGULAIR) 10 MG tablet Take 1 tablet (10 mg total) by mouth at bedtime. 30 tablet 11 09/06/2020   Multiple Vitamin  (MULTIVITAMIN WITH MINERALS) TABS tablet Take 1 tablet by mouth daily.   09/06/2020   naphazoline-glycerin (CLEAR EYES REDNESS) 0.012-0.2 % SOLN Place 1-2 drops into both eyes 4 (four) times daily as needed for eye irritation. 15 mL 0 prn at prn   nitroGLYCERIN (NITROSTAT) 0.4 MG SL tablet Place 1 tablet under tongue every 5 minutes as needed for chest pain. (No more than 3 doses within 15 minutes) 15 tablet 3 prn at prn   oxyCODONE-acetaminophen (PERCOCET) 7.5-325 MG tablet Take 1 tablet by mouth every 8 (eight) hours as needed for up to 5 days for severe pain. 15 tablet 0 prn at prn   potassium chloride SA (KLOR-CON) 20 MEQ tablet Take 1 tablet (20 mEq total) by mouth daily. 30 tablet 3 09/06/2020   SUMAtriptan (IMITREX) 50 MG tablet Take 50 mg by mouth every 2 (two) hours as needed for migraine.   prn at prn   torsemide 40 MG TABS Take 80 mg by mouth 2 (two) times daily. 120 tablet 0 09/06/2020   traZODone (DESYREL) 150 MG tablet Take 1 tablet (150 mg total) by mouth at bedtime. 90 tablet 1 09/06/2020   warfarin (COUMADIN) 5 MG tablet Take 5 mg by mouth daily.   09/06/2020   albuterol (VENTOLIN HFA) 108 (90 Base) MCG/ACT inhaler Inhale 2 puffs into the lungs every 4 (four) hours as needed for wheezing or shortness of breath. 8 g 2 prn at prn   Social History   Socioeconomic History   Marital status: Single    Spouse name: Not on file   Number of children: Not on file   Years of education: Not on file   Highest education level: Not on file  Occupational History   Not on file  Tobacco Use   Smoking status: Never   Smokeless tobacco: Never  Vaping Use   Vaping Use: Never used  Substance and Sexual Activity   Alcohol use: Not Currently   Drug use: Never   Sexual activity: Not Currently  Other Topics Concern   Not on file  Social History Narrative   Not on file   Social Determinants of Health   Financial Resource Strain: Not on file  Food Insecurity: Not on file  Transportation Needs:  Unmet Transportation Needs   Lack of Transportation (Medical): Yes   Lack of Transportation (Non-Medical): Yes  Physical Activity: Not on file  Stress: Not on file  Social Connections: Not on file  Intimate Partner Violence: Not on file    Family History  Problem Relation Age of Onset   Heart failure Mother    Lung cancer Mother    Lung cancer Father    Heart attack Maternal Grandmother    Heart attack Maternal Grandfather       Review of  systems complete and found to be negative unless listed above      PHYSICAL EXAM  General: Well developed, well nourished, in no acute distress HEENT:  Normocephalic and atramatic Neck:  No JVD.  Lungs: Clear bilaterally to auscultation and percussion. Heart: HRRR . Normal S1 and S2 without gallops or murmurs.  Abdomen: Bowel sounds are positive, abdomen soft and non-tender  Msk:  Back normal, normal gait. Normal strength and tone for age. Extremities: No clubbing, cyanosis or 3+edema.   Neuro: Alert and oriented X 3. Psych:  Good affect, responds appropriately  Labs:   Lab Results  Component Value Date   WBC 5.2 09/09/2020   HGB 13.6 09/09/2020   HCT 44.1 09/09/2020   MCV 96.5 09/09/2020   PLT 222 09/09/2020    Recent Labs  Lab 09/08/20 2137 09/10/20 0638  NA 134* 133*  K 4.1 5.0  CL 84* 84*  CO2 41* 39*  BUN 17 25*  CREATININE 0.92 1.27*  CALCIUM 9.0 9.3  PROT 6.3*  --   BILITOT 0.8  --   ALKPHOS 60  --   ALT 47*  --   AST 26  --   GLUCOSE 151* 94   Lab Results  Component Value Date   CKTOTAL 37 (L) 04/30/2020   TROPONINI 0.04 (HH) 08/07/2018    Lab Results  Component Value Date   CHOL 168 08/31/2020   CHOL 175 11/08/2019   CHOL 163 06/23/2019   Lab Results  Component Value Date   HDL 75 08/31/2020   HDL 75 11/08/2019   HDL 79 06/23/2019   Lab Results  Component Value Date   LDLCALC 76 08/31/2020   LDLCALC 87 11/08/2019   LDLCALC 65 06/23/2019   Lab Results  Component Value Date   TRIG 83  08/31/2020   TRIG 72 04/13/2020   TRIG 101 04/10/2020   Lab Results  Component Value Date   CHOLHDL 2.2 08/31/2020   CHOLHDL 2.3 11/08/2019   CHOLHDL 2.1 06/23/2019   No results found for: LDLDIRECT    Radiology: DG Chest 1 View  Result Date: 09/07/2020 CLINICAL DATA:  Chest pain. EXAM: CHEST  1 VIEW COMPARISON:  August 30, 2020 FINDINGS: Stable cardiomegaly. No pneumothorax. No nodules or masses. No focal infiltrates. No overt edema. IMPRESSION: No active disease. Electronically Signed   By: Gerome Sam III M.D   On: 09/07/2020 12:01   CT CHEST W CONTRAST  Result Date: 08/31/2020 CLINICAL DATA:  60 year old male with clinical suspicion for pulmonary embolism. EXAM: CT CHEST WITH CONTRAST TECHNIQUE: Multidetector CT imaging of the chest was performed during intravenous contrast administration. CONTRAST:  OMNIPAQUE IOHEXOL 350 MG/ML SOLN COMPARISON:  Chest CT 06/16/2020. FINDINGS: Cardiovascular: Suboptimal contrast bolus. With these limitations in mind, there are no central, lobar or segmental sized filling defect to indicate clinically relevant pulmonary embolism. Smaller subsegmental sized pulmonary emboli cannot be entirely excluded. Heart size is enlarged with left ventricular and left atrial dilatation. There is no significant pericardial fluid, thickening or pericardial calcification. There is aortic atherosclerosis, as well as atherosclerosis of the great vessels of the mediastinum and the coronary arteries, including calcified atherosclerotic plaque in the left main, left anterior descending, left circumflex and right coronary arteries. Calcifications of the aortic valve. Mediastinum/Nodes: No pathologically enlarged mediastinal or hilar lymph nodes. Esophagus is unremarkable in appearance. No axillary lymphadenopathy. Lungs/Pleura: Scarring in the periphery of the left lower lobe, similar to the prior study. No acute consolidative airspace disease. No pleural effusions.  No  suspicious appearing pulmonary nodules or masses are noted. Upper Abdomen: Aortic atherosclerosis. Severe diffuse low attenuation throughout the visualized hepatic parenchyma, indicative of severe hepatic steatosis. Musculoskeletal: There are no aggressive appearing lytic or blastic lesions noted in the visualized portions of the skeleton. IMPRESSION: 1. Limited study demonstrating no evidence of clinically relevant central, lobar or segmental sized pulmonary embolism. 2. No acute findings in the thorax to account for the patient's symptoms. 3. Aortic atherosclerosis, in addition to left main and 3 vessel coronary artery disease. Please note that although the presence of coronary artery calcium documents the presence of coronary artery disease, the severity of this disease and any potential stenosis cannot be assessed on this non-gated CT examination. Assessment for potential risk factor modification, dietary therapy or pharmacologic therapy may be warranted, if clinically indicated. 4. There are calcifications of the aortic valve. Echocardiographic correlation for evaluation of potential valvular dysfunction may be warranted if clinically indicated. 5. Severe hepatic steatosis. Aortic Atherosclerosis (ICD10-I70.0). Electronically Signed   By: Trudie Reed M.D.   On: 08/31/2020 12:20   CT ABDOMEN PELVIS W CONTRAST  Result Date: 08/30/2020 CLINICAL DATA:  Abdominal pain EXAM: CT ABDOMEN AND PELVIS WITH CONTRAST TECHNIQUE: Multidetector CT imaging of the abdomen and pelvis was performed using the standard protocol following bolus administration of intravenous contrast. CONTRAST:  OMNIPAQUE IOHEXOL 300 MG/ML  SOLN COMPARISON:  CT 06/16/2020 FINDINGS: Lower chest: No pleural or pericardial effusion. Visualized lung bases clear. Hepatobiliary: Fatty liver without discrete lesion or biliary ductal dilatation. No calcified stones in the gallbladder. Pancreas: Unremarkable. No pancreatic ductal dilatation or  surrounding inflammatory changes. Spleen: Normal in size without focal abnormality. Adrenals/Urinary Tract: Adrenal glands unremarkable. No renal mass or hydronephrosis. Limited renal excretion on delayed scans. The urinary bladder is incompletely distended. Stomach/Bowel: Stomach is physiologically distended by ingested fluid. The small bowel is nondilated. Normal appendix. The colon is nondilated with scattered diverticula, no focal adjacent inflammatory change. Vascular/Lymphatic: Scattered aortoiliac calcified plaque without aneurysm. Portal vein patent. Single 1.1 cm enlarged left common iliac chain lymph node, stable. Prominent bilateral inguinal lymph nodes as before. Reproductive: Normal-sized prostate with central coarse calcifications. Other: Bilateral pelvic phleboliths.  No ascites.  No free air. Musculoskeletal: Obesity. Multilevel spondylitic changes in the lower thoracic and lumbar spine. Advanced right hip DJD. No fracture or worrisome bone lesion. IMPRESSION: 1. No acute findings. 2. Scattered colonic diverticula. 3. Fatty liver. 4.  Aortic Atherosclerosis (ICD10-170.0). Electronically Signed   By: Corlis Leak M.D.   On: 08/30/2020 11:47   US Venous Img Lower Bilateral (DVT)  Result Date: 08/31/2020 CLINICAL DATA:  Edema, pain, history of PE EXAM: BILATERAL LOWER EXTREMITY VENOUS DOPPLER ULTRASOUND TECHNIQUE: Gray-scale sonography with compression, as well as color and duplex ultrasound, were performed to evaluate the deep venous system(s) from the level of the common femoral vein through the popliteal and proximal calf veins. COMPARISON:  01/08/2020 and previous FINDINGS: VENOUS Normal compressibility of the common femoral, superficial femoral, and popliteal veins, as well as the visualized calf veins. Visualized portions of profunda femoral vein and great saphenous vein unremarkable. No filling defects to suggest DVT on grayscale or color Doppler imaging. Doppler waveforms show normal direction  of venous flow, normal respiratory phasicity and response to augmentation. OTHER Right calf edema.  Left calf edema. Limitations: Technologist describes technically difficult study secondary to body habitus BMI 61. IMPRESSION: 1. Negative Electronically Signed   By: Corlis Leak M.D.   On: 08/31/2020 14:19  DG Chest Portable 1 View  Result Date: 08/30/2020 CLINICAL DATA:  Per Triage: Pt arrives via EMS from home after having increased SHOB- per EMS pt was 95% on his chronic 3L [place]- pt was also vomiting on the ems stretcher Patient alert and oriented. Patient able to move with help. Patient is naus.*comment was truncated*SHOB EXAM: PORTABLE CHEST 1 VIEW COMPARISON:  08/30/2018 FINDINGS: Stable cardiomegaly. There is increase in vascular congestion pattern. Probable mild edema superimposed on the vascular congestion. No pneumothorax. No focal consolidation. IMPRESSION: Cardiomegaly and increased pulmonary edema pattern. Electronically Signed   By: Genevive Bi M.D.   On: 08/30/2020 09:19   ECHOCARDIOGRAM COMPLETE  Result Date: 08/31/2020    ECHOCARDIOGRAM REPORT   Patient Name:   [name] [name] Date of Exam: 08/30/2020 Medical Rec #:  [id number]              Height:       68.0 in Accession #:    [id number]             Weight:       350.0 lb Date of Birth:  06/08/1960              BSA:          2.592 m Patient Age:    59 years               BP:           127/92 mmHg Patient Gender: M                      HR:           115 bpm. Exam Location:  [facility] Procedure: 2D Echo and Intracardiac Opacification Agent Indications:     CHF I50.21  History:         Patient has prior history of Echocardiogram examinations, most                  recent 04/07/2020.  Sonographer:     Overton Mam RDCS Referring Phys:  [id number] Brien Few NIU Diagnosing Phys: Chilton Si MD  Sonographer Comments: Technically challenging study due to limited acoustic windows, Technically difficult study due to poor echo windows, no apical  window, no parasternal window, no subcostal window and patient is morbidly obese. Image acquisition challenging due to patient body habitus. IMPRESSIONS  1. Left ventricular ejection fraction, by estimation, is unable to assess%. The left ventricle has severely decreased function. The left ventricle has no regional wall motion abnormalities. The left ventricular internal cavity size was moderately dilated. There is unable to assess left ventricular hypertrophy. Left ventricular diastolic function could not be evaluated.  2. Right ventricular systolic function is severely reduced. The right ventricular size is not well visualized.  3. The mitral valve was not well visualized. unable to assess mitral valve regurgitation. unable to assess mitral stenosis.  4. The aortic valve was not well visualized. Aortic valve regurgitation unable to assess. unable to assess.  5. Pulmonic valve regurgitation unable to assess. unable to assess pulmonic stenosis. FINDINGS  Left Ventricle: Left ventricular ejection fraction, by estimation, is unable to assess%. The left ventricle has severely decreased function. The left ventricle has no regional wall motion abnormalities. Definity contrast agent was given IV to delineate the left ventricular endocardial borders. The left ventricular internal cavity size was moderately dilated. There is unable to assess left ventricular hypertrophy. Left ventricular diastolic function could not be evaluated. Right Ventricle: The  right ventricular size is not well visualized. No increase in right ventricular wall thickness. Right ventricular systolic function is severely reduced. Left Atrium: Left atrial size was not well visualized. Right Atrium: Right atrial size was not well visualized. Pericardium: The pericardium was not well visualized. Mitral Valve: The mitral valve was not well visualized. Unable to assess mitral valve regurgitation. Unable to assess mitral valve stenosis. Tricuspid Valve: The  tricuspid valve is not well visualized. Tricuspid valve regurgitation is trivial. No evidence of tricuspid stenosis. Aortic Valve: The aortic valve was not well visualized. Aortic valve regurgitation unable to assess. Unable to assess. Aortic valve peak gradient measures 5.6 mmHg. Pulmonic Valve: The pulmonic valve was not well visualized. Pulmonic valve regurgitation unable to assess. Unable to assess pulmonic stenosis. Aorta: Aortic root could not be assessed. Venous: The pulmonary veins were not well visualized. The inferior vena cava was not well visualized. IAS/Shunts: The interatrial septum was not well visualized. Additional Comments: Image quality is very poor. Inadequate visualization of the valves and endocardium despite the use of Defnity conrast. Both right and left ventricular systolic function appears to be significantly imparied. Visualization is not adequate to quantify systolic function. Recommend cardiac MRI or TEE for better visualization if indicated.  AORTIC VALVE AV Vmax:      118.00 cm/s AV Peak Grad: 5.6 mmHg MITRAL VALVE               TRICUSPID VALVE MV Area (PHT): 3.81 cm    TR Peak grad:   12.8 mmHg MV Decel Time: 199 msec    TR Vmax:        179.00 cm/s MV E velocity: 86.10 cm/s Chilton Si MD Electronically signed by Chilton Si MD Signature Date/Time: 08/31/2020/12:49:59 PM    Final     EKG: Atrial fibrillation rapid ventricular response  ASSESSMENT AND PLAN:  Shortness of breath Congestive heart failure Obstructive sleep apnea Obesity COPD exacerbation GERD Cardiomyopathy ejection fraction between 25 and 30% Diabetes type 2 Atrial fibrillation Hypertension . Plan Agreed admit to telemetry Continue will myocardial infarction follow-up EKGs troponins Commend aggressive pulmonary support inhalers supplemental oxygen as necessary Agree with diuretic therapy for heart failure Recommend support stockings elevation DVT prophylaxis Continue current therapy for  nonischemic cardiomyopathy Maintain CPAP for obstructive sleep apnea Recommend significant weight loss exercise portion control Continue diabetes management and control Antibiotic therapy for cellulitis of lower legs Borderline troponins appears to be demand ischemia  Signed: Alwyn Pea MD 09/10/2020, 6:01 PM

## 2020-09-11 LAB — BASIC METABOLIC PANEL
Anion gap: 6 (ref 5–15)
BUN: 18 mg/dL (ref 6–20)
CO2: 38 mmol/L — ABNORMAL HIGH (ref 22–32)
Calcium: 8.9 mg/dL (ref 8.9–10.3)
Chloride: 91 mmol/L — ABNORMAL LOW (ref 98–111)
Creatinine, Ser: 0.73 mg/dL (ref 0.61–1.24)
GFR, Estimated: >60 mL/min (ref >60)
Glucose, Bld: 122 mg/dL — ABNORMAL HIGH (ref 70–99)
Potassium: 4.4 mmol/L (ref 3.5–5.1)
Sodium: 135 mmol/L (ref 135–145)

## 2020-09-11 LAB — CBC WITH DIFFERENTIAL/PLATELET
Abs Immature Granulocytes: 0.05 10*3/uL (ref 0.00–0.07)
Basophils Absolute: 0 10*3/uL (ref 0.0–0.1)
Basophils Relative: 0 %
Eosinophils Absolute: 0.1 10*3/uL (ref 0.0–0.5)
Eosinophils Relative: 3 %
HCT: 34 % — ABNORMAL LOW (ref 39.0–52.0)
Hemoglobin: 11.1 g/dL — ABNORMAL LOW (ref 13.0–17.0)
Immature Granulocytes: 1 %
Lymphocytes Relative: 24 %
Lymphs Abs: 1.2 10*3/uL (ref 0.7–4.0)
MCH: 31.4 pg (ref 26.0–34.0)
MCHC: 32.6 g/dL (ref 30.0–36.0)
MCV: 96.3 fL (ref 80.0–100.0)
Monocytes Absolute: 0.7 10*3/uL (ref 0.1–1.0)
Monocytes Relative: 14 %
Neutro Abs: 2.8 10*3/uL (ref 1.7–7.7)
Neutrophils Relative %: 58 %
Platelets: 187 10*3/uL (ref 150–400)
RBC: 3.53 MIL/uL — ABNORMAL LOW (ref 4.22–5.81)
RDW: 15.9 % — ABNORMAL HIGH (ref 11.5–15.5)
WBC: 4.8 10*3/uL (ref 4.0–10.5)
nRBC: 0 % (ref 0.0–0.2)

## 2020-09-11 LAB — PHOSPHORUS: Phosphorus: 3.6 mg/dL (ref 2.5–4.6)

## 2020-09-11 LAB — PROTIME-INR
INR: 2.4 — ABNORMAL HIGH (ref 0.8–1.2)
Prothrombin Time: 25.7 seconds — ABNORMAL HIGH (ref 11.4–15.2)

## 2020-09-11 LAB — MAGNESIUM: Magnesium: 2 mg/dL (ref 1.7–2.4)

## 2020-09-11 MED ORDER — SACUBITRIL-VALSARTAN 24-26 MG PO TABS
1.0000 | ORAL_TABLET | Freq: Two times a day (BID) | ORAL | Status: DC
  Administered 2020-09-11 – 2020-09-13 (×5): 1 via ORAL
  Filled 2020-09-11 (×5): qty 1

## 2020-09-11 MED ORDER — ALPRAZOLAM 0.5 MG PO TABS
1.0000 mg | ORAL_TABLET | Freq: Two times a day (BID) | ORAL | Status: DC | PRN
  Administered 2020-09-12 – 2020-09-13 (×3): 1 mg via ORAL
  Filled 2020-09-11 (×3): qty 2

## 2020-09-11 MED ORDER — SPIRONOLACTONE 25 MG PO TABS
12.5000 mg | ORAL_TABLET | Freq: Every day | ORAL | Status: DC
  Administered 2020-09-11 – 2020-09-13 (×3): 12.5 mg via ORAL
  Filled 2020-09-11 (×3): qty 0.5
  Filled 2020-09-11 (×3): qty 1

## 2020-09-11 MED ORDER — TORSEMIDE 20 MG PO TABS
20.0000 mg | ORAL_TABLET | Freq: Every day | ORAL | Status: DC
  Administered 2020-09-11 – 2020-09-13 (×3): 20 mg via ORAL
  Filled 2020-09-11 (×3): qty 1

## 2020-09-11 MED ORDER — ENSURE MAX PROTEIN PO LIQD
11.0000 [oz_av] | Freq: Two times a day (BID) | ORAL | Status: DC
  Administered 2020-09-11 – 2020-09-13 (×2): 11 [oz_av] via ORAL
  Filled 2020-09-11: qty 330

## 2020-09-11 MED ORDER — WARFARIN SODIUM 6 MG PO TABS
6.0000 mg | ORAL_TABLET | Freq: Once | ORAL | Status: AC
  Administered 2020-09-11: 6 mg via ORAL
  Filled 2020-09-11: qty 1

## 2020-09-11 MED ORDER — CARVEDILOL 6.25 MG PO TABS
6.2500 mg | ORAL_TABLET | Freq: Two times a day (BID) | ORAL | Status: DC
  Administered 2020-09-11 – 2020-09-13 (×5): 6.25 mg via ORAL
  Filled 2020-09-11 (×5): qty 1

## 2020-09-11 NOTE — Progress Notes (Signed)
[facility] Cardiology    SUBJECTIVE: Patient feels somewhat better less short of breath he had some vague atypical left shoulder upper chest pain minimal cough he has been compliant with his CPAP adequate appetite no fever chills or sweats   Vitals:   09/10/20 2119 09/10/20 2221 09/11/20 0546 09/11/20 0725  BP: (!) 110/55  121/66 (!) 116/58  Pulse: (!) 59 74 93 64  Resp: 20   18  Temp: 98 F (36.7 C)  97.6 F (36.4 C) 98.1 F (36.7 C)  TempSrc:   Oral Oral  SpO2: 100% 100% 100% 100%  Weight:   (!) 174.3 kg   Height:         Intake/Output Summary (Last 24 hours) at 09/11/2020 0914 Last data filed at 09/11/2020 0516 Gross per 24 hour  Intake 537 ml  Output 2070 ml  Net -1533 ml      PHYSICAL EXAM  General: Well developed, well nourished, in no acute distress HEENT:  Normocephalic and atramatic Neck:  No JVD.  Lungs: Diminished diffuse Bilaterally to auscultation and percussion. Heart: HRRR . Normal S1 and S2 without gallops or murmurs.  Abdomen: Bowel sounds are positive, abdomen soft and non-tender  Msk:  Back normal, normal gait. Normal strength and tone for age. Extremities: No clubbing, cyanosis or 3+edema.   Neuro: Alert and oriented X 3. Psych:  Good affect, responds appropriately   LABS: Basic Metabolic Panel: Recent Labs    09/08/20 2137 09/10/20 0638 09/11/20 0535  NA 134* 133* 135  K 4.1 5.0 4.4  CL 84* 84* 91*  CO2 41* 39* 38*  GLUCOSE 151* 94 122*  BUN 17 25* 18  CREATININE 0.92 1.27* 0.73  CALCIUM 9.0 9.3 8.9  MG 1.9  --  2.0  PHOS 4.7*  --  3.6   Liver Function Tests: Recent Labs    09/08/20 2137  AST 26  ALT 47*  ALKPHOS 60  BILITOT 0.8  PROT 6.3*  ALBUMIN 3.2*   No results for input(s): LIPASE, AMYLASE in the last 72 hours. CBC: Recent Labs    09/09/20 0645 09/11/20 0535  WBC 5.2 4.8  NEUTROABS  --  2.8  HGB 13.6 11.1*  HCT 44.1 34.0*  MCV 96.5 96.3  PLT 222 187   Cardiac Enzymes: No results for input(s): CKTOTAL, CKMB,  CKMBINDEX, TROPONINI in the last 72 hours. BNP: Invalid input(s): POCBNP D-Dimer: No results for input(s): DDIMER in the last 72 hours. Hemoglobin A1C: No results for input(s): HGBA1C in the last 72 hours. Fasting Lipid Panel: No results for input(s): CHOL, HDL, LDLCALC, TRIG, CHOLHDL, LDLDIRECT in the last 72 hours. Thyroid Function Tests: No results for input(s): TSH, T4TOTAL, T3FREE, THYROIDAB in the last 72 hours.  Invalid input(s): FREET3 Anemia Panel: No results for input(s): VITAMINB12, FOLATE, FERRITIN, TIBC, IRON, RETICCTPCT in the last 72 hours.  No results found.   Echo moderate to severely reduced left ventricular function 30 to 35%  TELEMETRY: Atrial fibrillation rate controlled nonspecific ST-T wave changes:  ASSESSMENT AND PLAN:  Active Problems:   Atrial fibrillation with rapid ventricular response (HCC)   Acute on chronic systolic CHF (congestive heart failure) (HCC)   CHF (congestive heart failure), NYHA class II, unspecified failure chronicity, systolic (HCC)   Atrial fibrillation (HCC)   Subtherapeutic anticoagulation    Plan Continued therapy for atrial fibrillation rate control Coumadin for anticoagulation Coreg for rate Systolic congestive heart failure acute on chronic ejection fraction  30-35% patient started on Entresto Coreg spironolactone Lasix  Obesity recommend weight loss exercise portion control Obstructive sleep apnea continue CPAP recommend weight loss follow-up with pulmonary Shortness of breath dyspnea respiratory failure multifactorial heart failure is not the main problem is BNP is not significantly high enough we will continue current therapy Recommend inhalers as necessary for COPD chronic respiratory failure Edema bilateral lower extremities recommend diuretics recommend support stockings elevation    Alwyn Pea, MD, 09/11/2020 9:14 AM

## 2020-09-11 NOTE — Progress Notes (Signed)
Changed patient to bipap from cpap, patient was not tolerating cpap, with astronomical leak and tidal volume. Since changing to bipap, tolerated well, with leak being within normal range.

## 2020-09-11 NOTE — Progress Notes (Signed)
   09/10/20 0409  Assess: MEWS Score  Temp 97.8 F (36.6 C)  BP (!) 93/46  Pulse Rate (!) 46  Resp 19  SpO2 100 %  O2 Device Nasal Cannula  O2 Flow Rate (L/min) 4 L/min  Assess: MEWS Score  MEWS Temp 0  MEWS Systolic 1  MEWS Pulse 1  MEWS RR 0  MEWS LOC 0  MEWS Score 2  MEWS Score Color Yellow  Assess: if the MEWS score is Yellow or Red  Were vital signs taken at a resting state? Yes  Focused Assessment No change from prior assessment  Does the patient meet 2 or more of the SIRS criteria? No  MEWS guidelines implemented *See Row Information* Yes  Treat  MEWS Interventions Escalated (See documentation below)  Take Vital Signs  Increase Vital Sign Frequency  Yellow: Q 2hr X 2 then Q 4hr X 2, if remains yellow, continue Q 4hrs  Escalate  MEWS: Escalate Yellow: discuss with charge nurse/RN and consider discussing with provider and RRT  Notify: Charge Nurse/RN  Name of Charge Nurse/RN Notified Marcicar  Date Charge Nurse/RN Notified 09/10/20  Time Charge Nurse/RN Notified 0430  Assess: SIRS CRITERIA  SIRS Temperature  0  SIRS Pulse 0  SIRS Respirations  0  SIRS WBC 0  SIRS Score Sum  0  Discussed with Dr.Duncan, no new orders at this time, just continue to monitor.

## 2020-09-11 NOTE — Plan of Care (Signed)
Nutrition Education Note   RD consulted for nutrition education regarding weight loss.  Body mass index is 58.43 kg/m. Pt meets criteria for morbid obesity based on current BMI.  Met with pt in room today. Pt is well known to this RD from previous admissions. Pt has received numerous educations regarding low sodium diet education in the past. Pt requesting today for help with weight loss. Pt with good appetite and oral intake at baseline. Pt reports that a typical day for him might include a Ensure Max for breakfast, a sensible lunch and dinner and he snacks on fruit in between meals. Pt also reports that he typically drinks Sprite all day but that he has started to drink more water. Pt reports that he has lost 47lbs so far but reports that he knows he needs to loose more weight.   RD provided "Weight Loss Tips" handout from the [employer]. Emphasized the importance of serving sizes and provided examples of correct portions of common foods. Discussed importance of controlled and consistent intake throughout the day. Provided examples of ways to balance meals and encouraged intake of high-fiber, whole grain complex carbohydrates. Emphasized the importance of hydration with calorie-free beverages and limiting sugar-sweetened beverages. Recommended avoiding snacks in between meals. Encouraged pt to discuss physical activity options with physician. Teach back method used.  Expect poor compliance.  Current diet order is heart healthy, patient is consuming approximately 100% of meals at this time. Labs and medications reviewed.   RD will order Ensure Max Protein BID per pt's request  No further nutrition interventions warranted at this time. RD contact information provided. If additional nutrition issues arise, please re-consult RD.  [name] Distance MS, RD, LDN Please refer to [facility] for RD and/or RD on-call/weekend/after hours pager

## 2020-09-11 NOTE — Progress Notes (Signed)
PROGRESS NOTE  [name] [name]  DOB: 1960/10/27  PCP: [name], DO QIH:[id number]  DOA: 09/07/2020  LOS: 3 days  Hospital Day: 5   Chief Complaint  Patient presents with   Chest Pain    Brief narrative: [name] [name] is a 60 y.o. male with PMH significant for morbid obesity, sleep apnea, chronic alcohol use, nonischemic cardiomyopathy, chronic systolic CHF, HTN, COPD on home oxygen, DVT/PE, A. fib on Coumadin, hypothyroidism, chronic pain and recent COVID-19 infection. Patient presented to the ED on 7/24 with shortness of breath, found to be in rapid A. fib and acute decompensated CHF. Patient was admitted to hospitalist service, started on Lasix drip. See below for details  Subjective: Patient was seen and examined this morning. Sitting up in chair.  Not in distress.  On 4 L oxygen by nasal cannula which is baseline. Blood pressure and creatinine better today.  Assessment/Plan: Acute decompensation of chronic systolic CHF Essential hypertension -Home meds include Toprol 100 mg daily, torsemide 80 mg twice daily, lisinopril 2.5 mg daily -Echo from February 2022 had shown an EF of 30 to 35%.  7/16, echocardiogram showed severely decreased LV function with moderately dilated LV, unable to assess EF torsemide, Entresto, severely reduced RV function -Patient was diuresed with IV Lasix.  I stopped IV Lasix yesterday because of elevated creatinine and low blood pressure.  Both of them have improved.   -Cardiology consultation appreciated.  CHF regimen started including Coreg, Entresto, torsemide, Aldactone.   -Net IO Since Admission: -2,938.5 mL [09/11/20 1322] -Continue to monitor for daily intake output, weight, blood pressure, BNP, renal function and electrolytes. -Monitor for next 24 hours.  Plan to discharge tomorrow if hemodynamics and electrolytes remain stable. Recent Labs  Lab 09/05/20 0508 09/07/20 1736 09/07/20 1737 09/08/20 2137  09/10/20 0638 09/11/20 0535  BNP  --  160.0*  --   --   --   --   BUN 29*  --  18 17 25* 18  CREATININE 0.67  --  0.73 0.92 1.27* 0.73  K 3.9  --  3.0* 4.1 5.0 4.4  MG  --   --  2.0 1.9  --  2.0    Hypokalemia -Continue to monitor while on diuretics.  Permanent A. Fib RVR at presentation -Initially started on Cardizem drip.  He was later switched to oral Cardizem.  In the face of poor ejection fraction and intermittent bradycardia, Cardizem was stopped. -Continue metoprolol at reduced dose. -Continue Coumadin to target INR 2-3. Recent Labs  Lab 09/07/20 1737 09/08/20 0735 09/09/20 0645 09/10/20 0638 09/11/20 0535  INR 1.9* 1.4* 1.6* 2.2* 2.4*    Elevated troponin History of nonischemic cardiomyopathy -Troponin elevated likely because of RVR and decompensated CHF.  Per report, cardiac cath in recent past had shown normal coronaries. -Continue aspirin and statin.  Chronic alcohol abuse -Per patient's sister, patient continues to drink 2 get rid of chronic pain -No evidence of alcohol withdrawal in the hospital.  Chronic pain Depression -Continue Robaxin, as needed oxycodone, Cymbalta, trazodone -I added Xanax 0.5 mg twice daily yesterday.  Patient did not daily improvement.  He is morbidly obese, may benefit from Xanax 1 mg daily.  Recent COVID-19 infection -Completed 5 days of remdesivir.  -Off isolation from today 7/27  Morbid obesity  -Body mass index is 58.43 kg/m. Patient has been advised to make an attempt to improve diet and exercise patterns to aid in weight loss.  Obstructive sleep apnea -Continue nightly CPAP.  Hypothyroidism -  Continue Synthroid  Recurrent hospitalization -Palliative care consultation appreciated  Mobility: Limited mobility because of severe osteoarthritis.  But he uses a walker to get around.  PT eval appreciated Code Status:   Code Status: Full Code  Nutritional status: Body mass index is 58.43 kg/m.     Diet:  Diet Order              Diet Heart Room service appropriate? Yes; Fluid consistency: Thin; Fluid restriction: 1200 mL Fluid  Diet effective now                  DVT prophylaxis:  SCDs Start: 09/07/20 2152 warfarin (COUMADIN) tablet 6 mg   Antimicrobials: None Fluid: None Consultants: Cardiology Family Communication: None  Status is: Inpatient  Remains inpatient appropriate because: Needs further medication management for severe CHF  Dispo: The patient is from: Home              Anticipated d/c is to: Hopefully home tomorrow with home health services              Patient currently is not medically stable to d/c.   Difficult to place patient No     Infusions:   sodium chloride      Scheduled Meds:  allopurinol  100 mg Oral Daily   aspirin EC  81 mg Oral Daily   atorvastatin  40 mg Oral Daily   carvedilol  6.25 mg Oral BID WC   diclofenac Sodium  4 g Topical QID   DULoxetine  20 mg Oral Daily   folic acid  1 mg Oral Daily   levothyroxine  75 mcg Oral QAC breakfast   liver oil-zinc oxide  1 application Topical BID   methocarbamol  750 mg Oral TID   multivitamin with minerals  1 tablet Oral Daily   sacubitril-valsartan  1 tablet Oral BID   sodium chloride flush  3 mL Intravenous Q12H   spironolactone  12.5 mg Oral Daily   thiamine  100 mg Oral Daily   Or   thiamine  100 mg Intravenous Daily   torsemide  20 mg Oral Daily   traZODone  150 mg Oral QHS   warfarin  6 mg Oral ONCE-1600   Warfarin - Pharmacist Dosing Inpatient   Does not apply q1600    Antimicrobials: Anti-infectives (From admission, onward)    None       PRN meds: sodium chloride, acetaminophen, ALPRAZolam, chlorpheniramine-HYDROcodone, meclizine, nitroGLYCERIN, ondansetron (ZOFRAN) IV, oxyCODONE-acetaminophen, sodium chloride flush   Objective: Vitals:   09/11/20 0725 09/11/20 1138  BP: (!) 116/58 103/82  Pulse: 64 81  Resp: 18 19  Temp: 98.1 F (36.7 C) 99.2 F (37.3 C)  SpO2: 100% 100%     Intake/Output Summary (Last 24 hours) at 09/11/2020 1322 Last data filed at 09/11/2020 1245 Gross per 24 hour  Intake 237 ml  Output 1945 ml  Net -1708 ml    Filed Weights   09/09/20 0617 09/10/20 0122 09/11/20 0546  Weight: (!) 170.8 kg (!) 173.8 kg (!) 174.3 kg   Weight change: 0.499 kg Body mass index is 58.43 kg/m.   Physical Exam: General exam: Pleasant elderly middle-aged morbidly obese Caucasian male.  Not in physical distress at this time  skin: No rashes, lesions or ulcers. HEENT: Atraumatic, normocephalic, no obvious bleeding Lungs: Clear to auscultation bilaterally CVS: Regular rate and rhythm, no murmur GI/Abd soft, distended from obesity, nontender, bowel sound present CNS: Alert, awake and oriented x3 Psychiatry: Mood appropriate  Extremities: Bilateral pedal edema seems to be improving.  Skin wrinkling seen in both legs with stasis changes.  Data Review: I have personally reviewed the laboratory data and studies available.  Recent Labs  Lab 09/07/20 1737 09/09/20 0645 09/11/20 0535  WBC 7.2 5.2 4.8  NEUTROABS  --   --  2.8  HGB 12.7* 13.6 11.1*  HCT 38.4* 44.1 34.0*  MCV 93.7 96.5 96.3  PLT 206 222 187    Recent Labs  Lab 09/05/20 0508 09/07/20 1737 09/08/20 2137 09/10/20 0638 09/11/20 0535  NA 138 137 134* 133* 135  K 3.9 3.0* 4.1 5.0 4.4  CL 83* 83* 84* 84* 91*  CO2 43* 44* 41* 39* 38*  GLUCOSE 185* 139* 151* 94 122*  BUN 29* 18 17 25* 18  CREATININE 0.67 0.73 0.92 1.27* 0.73  CALCIUM 8.5* 8.5* 9.0 9.3 8.9  MG  --  2.0 1.9  --  2.0  PHOS  --   --  4.7*  --  3.6     F/u labs ordered. Unresulted Labs (From admission, onward)     Start     Ordered   09/11/20 0500  CBC with Differential/Platelet  Daily,   R     Question:  Specimen collection method  Answer:  Lab=Lab collect   09/10/20 1039   09/10/20 0000  Basic metabolic panel  Daily,   R      09/09/20 1004   09/09/20 0500  Protime-INR  Daily,   STAT      09/08/20 1122             Signed, Lorin Glass, MD [facility] 09/11/2020

## 2020-09-11 NOTE — Progress Notes (Signed)
ANTICOAGULATION CONSULT NOTE   Pharmacy Consult for Warfarin  Indication: atrial fibrillation  No Known Allergies  Patient Measurements: Height: 5\' 8"  (172.7 cm) Weight: (!) 174.3 kg (384 lb 4.8 oz) IBW/kg (Calculated) : 68.4  Vital Signs: Temp: 98.1 F (36.7 C) (07/28 0725) Temp Source: Oral (07/28 0725) BP: 116/58 (07/28 0725) Pulse Rate: 64 (07/28 0725)  Labs: Recent Labs    09/08/20 2137 09/09/20 0645 09/10/20 09/12/20 09/11/20 0535  HGB  --  13.6  --  11.1*  HCT  --  44.1  --  34.0*  PLT  --  222  --  187  LABPROT  --  18.6* 24.2* 25.7*  INR  --  1.6* 2.2* 2.4*  CREATININE 0.92  --  1.27* 0.73     Estimated Creatinine Clearance: 153.9 mL/min (by C-G formula based on SCr of 0.73 mg/dL).   Medical History: Past Medical History:  Diagnosis Date   Allergy    Anxiety    Arthritis    Asthma    Brain damage    Chronic pain of both knees 07/13/2018   Clotting disorder (HCC)    COPD (chronic obstructive pulmonary disease) (HCC)    Depression    GERD (gastroesophageal reflux disease)    HFrEF (heart failure with reduced ejection fraction) (HCC)    a. 03/2018 Echo: EF 25-30%, diff HK. Mod LAE.   History of DVT (deep vein thrombosis)    History of pulmonary embolism    a. Chronic coumadin.   Hypertension    MI (myocardial infarction) (HCC)    Morbid obesity (HCC)    Neck pain 07/13/2018   NICM (nonischemic cardiomyopathy) (HCC)    a. s/p Cath x 3 - reportedly nl cors. Last cath 2019 in [place]; b. a. 03/2018 Echo: EF 25-30%, diff HK.   Persistent atrial fibrillation (HCC)    a. 03/2018 s/p DCCV; b. CHA2DS2VASc = 1-->Xarelto (later changed to warfarin); c. 05/2018 recurrent afib-->Amio initiated.   Sleep apnea    Sleep apnea     Medications:  Medications Prior to Admission  Medication Sig Dispense Refill Last Dose   allopurinol (ZYLOPRIM) 100 MG tablet Take 1 tablet (100 mg total) by mouth daily. 30 tablet 0 09/06/2020   aspirin EC 81 MG tablet Take 81 mg by mouth  daily. Swallow whole.   09/06/2020   atorvastatin (LIPITOR) 40 MG tablet TAKE 1 TABLET BY MOUTH ONCE DAILY 90 tablet 1 09/06/2020   colchicine 0.6 MG tablet Take 1 tablet (0.6 mg total) by mouth daily. 30 tablet 0 09/06/2020   diclofenac Sodium (VOLTAREN) 1 % GEL Apply topically.   prn at prn   DULoxetine (CYMBALTA) 20 MG capsule Take 1 capsule (20 mg total) by mouth daily. 90 capsule 1 09/06/2020   guaiFENesin-dextromethorphan (ROBITUSSIN DM) 100-10 MG/5ML syrup Take 5 mLs by mouth every 4 (four) hours as needed for cough. 118 mL 0 prn at prn   levothyroxine (SYNTHROID) 75 MCG tablet Take 1 tablet (75 mcg total) by mouth daily before breakfast. 30 tablet 3 09/06/2020   lisinopril (ZESTRIL) 2.5 MG tablet Take 2.5 mg by mouth daily.   09/06/2020   meclizine (ANTIVERT) 25 MG tablet Take 0.5-1 tablets (12.5-25 mg total) by mouth 3 (three) times daily as needed for dizziness. 60 tablet 2 prn at prn   methocarbamol (ROBAXIN) 750 MG tablet Take 750 mg by mouth 3 (three) times daily.   09/06/2020   metoprolol succinate (TOPROL-XL) 100 MG 24 hr tablet Take 100 mg by mouth  daily.   09/06/2020   montelukast (SINGULAIR) 10 MG tablet Take 1 tablet (10 mg total) by mouth at bedtime. 30 tablet 11 09/06/2020   Multiple Vitamin (MULTIVITAMIN WITH MINERALS) TABS tablet Take 1 tablet by mouth daily.   09/06/2020   naphazoline-glycerin (CLEAR EYES REDNESS) 0.012-0.2 % SOLN Place 1-2 drops into both eyes 4 (four) times daily as needed for eye irritation. 15 mL 0 prn at prn   nitroGLYCERIN (NITROSTAT) 0.4 MG SL tablet Place 1 tablet under tongue every 5 minutes as needed for chest pain. (No more than 3 doses within 15 minutes) 15 tablet 3 prn at prn   [EXPIRED] oxyCODONE-acetaminophen (PERCOCET) 7.5-325 MG tablet Take 1 tablet by mouth every 8 (eight) hours as needed for up to 5 days for severe pain. 15 tablet 0 prn at prn   potassium chloride SA (KLOR-CON) 20 MEQ tablet Take 1 tablet (20 mEq total) by mouth daily. 30 tablet 3  09/06/2020   SUMAtriptan (IMITREX) 50 MG tablet Take 50 mg by mouth every 2 (two) hours as needed for migraine.   prn at prn   torsemide 40 MG TABS Take 80 mg by mouth 2 (two) times daily. 120 tablet 0 09/06/2020   traZODone (DESYREL) 150 MG tablet Take 1 tablet (150 mg total) by mouth at bedtime. 90 tablet 1 09/06/2020   warfarin (COUMADIN) 5 MG tablet Take 5 mg by mouth daily.   09/06/2020   albuterol (VENTOLIN HFA) 108 (90 Base) MCG/ACT inhaler Inhale 2 puffs into the lungs every 4 (four) hours as needed for wheezing or shortness of breath. 8 g 2 prn at prn    Assessment: 60 y.o. male with medical history significant of dCHF, hypertension, hyperlipidemia, diet-controlled diabetes, COPD, hypothyroidism, gout, depression, anxiety, OSA on CPAP, atrial fibrillation, PE/DVT on Coumadin, CAD, alcohol abuse, obesity, who presents with shortness of breath w/u in ED revealed Afib w RVR. Pt has a history of being subtherapeutic on warfarin and had been switched to Eliquis due to non-compliance with INR checks, however, now pt is back on warfarin. Pharmacy consulted to dose warfarin for Afib w/ h/o DVT/PE . Pt was on Warfarin 5 mg PO daily PTA.   Pt is from nursing home so most likely had a dose of warfarin on 7/24.    Medications: Pharmacy technician spoke with pt who confirmed pt takes warfarin 5 mg daily and his last dose was 7/23.    Date    INR      Dose/Comment 7/16     1.1       7.5 mg 7/17     1.4       7.5 mg 7/18     1.8       5 mg (0 >100% meals eaten) 7/19     2.5       5 mg     7/20     2.3       5 mg 7/21     2.3       5 mg --------- 7/23 --- 5mg  PTA 7/24 1.9 (2.5mg  ordered but not given?) 7/25 1.4 7.5mg  (lovenox bridge started) 7/26 1.6 7.5 mg  7/27 2.2 6 mg  7/28 2.4 6 mg   Goal of Therapy:  INR 2-3   Plan:  Lovenox Plan: INR > 2 - will discontinue enoxaparin.   Warfarin Plan: INR is therapeutic. Will give warfarin 6 mg x 1 tonight. Pt is consistently subtherapeutic when  admitted, either patient is non-compliant with  medication or the dose is not optimal. May need to be discharged on a high warfairn dose. Pt has a large body weight, which may require a larger dose of warfarin. Daily INR ordered. CBC at least every 3 days.    Ronnald Ramp, PharmD  09/11/2020,7:40 AM

## 2020-09-11 NOTE — Progress Notes (Signed)
   Heart Failure Nurse Navigator Note  HFrEF 30-35%   Met with patient today, he is currently sitting up in the chair at bedside, he denied any increasing shortness of breath, chest pain dizziness or lightheadedness.  Discussed his fluid intake and he states at home that he has a 2 L bottle of soda that sits by his chair and that is what he drinks on all day long.  Explained to him that that is approximately half of his limit that he has here at the hospital.  He states in the past that he has refused hospital bed but is now asking if he would not qualify for 1.  Instructed would talk with TOC and they would get back to him.  He is wanting to lose weight, order for consult for dietitian to help him with that has been placed.  Pricilla Riffle RN [facility]

## 2020-09-11 NOTE — Progress Notes (Signed)
Pt is A&O, VS stable, afib on the monitor. Was very gruff with me and when trying to do my assessment, he states 'I am just trying to sleep, yall keep coming in here'. No complaints of pain or discomfort.on bipap at night.

## 2020-09-12 LAB — CBC WITH DIFFERENTIAL/PLATELET
Abs Immature Granulocytes: 0.05 10*3/uL (ref 0.00–0.07)
Basophils Absolute: 0 10*3/uL (ref 0.0–0.1)
Basophils Relative: 0 %
Eosinophils Absolute: 0.1 10*3/uL (ref 0.0–0.5)
Eosinophils Relative: 2 %
HCT: 36.3 % — ABNORMAL LOW (ref 39.0–52.0)
Hemoglobin: 11.4 g/dL — ABNORMAL LOW (ref 13.0–17.0)
Immature Granulocytes: 1 %
Lymphocytes Relative: 22 %
Lymphs Abs: 1.1 10*3/uL (ref 0.7–4.0)
MCH: 31 pg (ref 26.0–34.0)
MCHC: 31.4 g/dL (ref 30.0–36.0)
MCV: 98.6 fL (ref 80.0–100.0)
Monocytes Absolute: 0.7 10*3/uL (ref 0.1–1.0)
Monocytes Relative: 14 %
Neutro Abs: 3.1 10*3/uL (ref 1.7–7.7)
Neutrophils Relative %: 61 %
Platelets: 190 10*3/uL (ref 150–400)
RBC: 3.68 MIL/uL — ABNORMAL LOW (ref 4.22–5.81)
RDW: 16 % — ABNORMAL HIGH (ref 11.5–15.5)
WBC: 5.1 10*3/uL (ref 4.0–10.5)
nRBC: 0 % (ref 0.0–0.2)

## 2020-09-12 LAB — BASIC METABOLIC PANEL
Anion gap: 7 (ref 5–15)
BUN: 24 mg/dL — ABNORMAL HIGH (ref 6–20)
CO2: 35 mmol/L — ABNORMAL HIGH (ref 22–32)
Calcium: 8.7 mg/dL — ABNORMAL LOW (ref 8.9–10.3)
Chloride: 93 mmol/L — ABNORMAL LOW (ref 98–111)
Creatinine, Ser: 0.84 mg/dL (ref 0.61–1.24)
GFR, Estimated: >60 mL/min (ref >60)
Glucose, Bld: 114 mg/dL — ABNORMAL HIGH (ref 70–99)
Potassium: 4.5 mmol/L (ref 3.5–5.1)
Sodium: 135 mmol/L (ref 135–145)

## 2020-09-12 LAB — PROTIME-INR
INR: 2.2 — ABNORMAL HIGH (ref 0.8–1.2)
Prothrombin Time: 24.5 seconds — ABNORMAL HIGH (ref 11.4–15.2)

## 2020-09-12 MED ORDER — SPIRONOLACTONE 25 MG PO TABS: 12.5000 mg | ORAL_TABLET | Freq: Every day | ORAL | 2 refills | Status: AC

## 2020-09-12 MED ORDER — ALBUTEROL SULFATE HFA 108 (90 BASE) MCG/ACT IN AERS: 2.0000 | INHALATION_SPRAY | RESPIRATORY_TRACT | 2 refills | Status: DC | PRN

## 2020-09-12 MED ORDER — ALLOPURINOL 100 MG PO TABS: 100.0000 mg | ORAL_TABLET | Freq: Every day | ORAL | 0 refills | Status: AC

## 2020-09-12 MED ORDER — LEVALBUTEROL HCL 0.63 MG/3ML IN NEBU: 0.6300 mg | INHALATION_SOLUTION | Freq: Four times a day (QID) | RESPIRATORY_TRACT | Status: DC | PRN

## 2020-09-12 MED ORDER — WARFARIN SODIUM 5 MG PO TABS
5.0000 mg | ORAL_TABLET | Freq: Every day | ORAL | 0 refills | Status: DC
Start: 2020-09-12 — End: 2020-10-12

## 2020-09-12 MED ORDER — SUMATRIPTAN SUCCINATE 50 MG PO TABS: 50.0000 mg | ORAL_TABLET | ORAL | 0 refills | Status: DC | PRN

## 2020-09-12 MED ORDER — LEVOTHYROXINE SODIUM 75 MCG PO TABS: 75.0000 ug | ORAL_TABLET | Freq: Every day | ORAL | 0 refills | Status: DC

## 2020-09-12 MED ORDER — ALPRAZOLAM 1 MG PO TABS: 1.0000 mg | ORAL_TABLET | Freq: Two times a day (BID) | ORAL | 0 refills | Status: AC | PRN

## 2020-09-12 MED ORDER — CARVEDILOL 6.25 MG PO TABS: 6.2500 mg | ORAL_TABLET | Freq: Two times a day (BID) | ORAL | 2 refills | Status: AC

## 2020-09-12 MED ORDER — WARFARIN SODIUM 6 MG PO TABS
6.0000 mg | ORAL_TABLET | Freq: Once | ORAL | Status: AC
  Administered 2020-09-12: 6 mg via ORAL
  Filled 2020-09-12: qty 1

## 2020-09-12 MED ORDER — METHOCARBAMOL 750 MG PO TABS: 750.0000 mg | ORAL_TABLET | Freq: Three times a day (TID) | ORAL | 0 refills | Status: AC

## 2020-09-12 MED ORDER — TORSEMIDE 20 MG PO TABS: 20.0000 mg | ORAL_TABLET | Freq: Every day | ORAL | 2 refills | Status: DC

## 2020-09-12 MED ORDER — IPRATROPIUM BROMIDE 0.02 % IN SOLN: 0.5000 mg | Freq: Four times a day (QID) | RESPIRATORY_TRACT | Status: DC | PRN

## 2020-09-12 MED ORDER — COLCHICINE 0.6 MG PO TABS: 0.6000 mg | ORAL_TABLET | Freq: Every day | ORAL | 0 refills | Status: AC

## 2020-09-12 MED ORDER — DULOXETINE HCL 20 MG PO CPEP: 20.0000 mg | ORAL_CAPSULE | Freq: Every day | ORAL | 0 refills | Status: DC

## 2020-09-12 MED ORDER — TRAZODONE HCL 100 MG PO TABS: 100.0000 mg | ORAL_TABLET | Freq: Every day | ORAL | 0 refills | Status: DC

## 2020-09-12 MED ORDER — OXYCODONE-ACETAMINOPHEN 7.5-325 MG PO TABS: 1.0000 | ORAL_TABLET | Freq: Three times a day (TID) | ORAL | 0 refills | Status: AC | PRN

## 2020-09-12 MED ORDER — GUAIFENESIN-DM 100-10 MG/5ML PO SYRP: 5.0000 mL | ORAL_SOLUTION | ORAL | 0 refills | Status: DC | PRN

## 2020-09-12 MED ORDER — SACUBITRIL-VALSARTAN 24-26 MG PO TABS: 1.0000 | ORAL_TABLET | Freq: Two times a day (BID) | ORAL | 2 refills | Status: DC

## 2020-09-12 MED ORDER — ATORVASTATIN CALCIUM 40 MG PO TABS: 40.0000 mg | ORAL_TABLET | Freq: Every day | ORAL | 0 refills | Status: AC

## 2020-09-12 MED ORDER — MONTELUKAST SODIUM 10 MG PO TABS: 10.0000 mg | ORAL_TABLET | Freq: Every day | ORAL | 0 refills | Status: DC

## 2020-09-12 MED ORDER — ASPIRIN EC 81 MG PO TBEC: 81.0000 mg | DELAYED_RELEASE_TABLET | Freq: Every day | ORAL | 0 refills | Status: AC

## 2020-09-12 NOTE — Discharge Summary (Addendum)
Physician Discharge Summary  [name] [name] FXT:[id number] DOB: Aug 25, 1960 DOA: 09/07/2020  PCP: [name] Cords, DO  Admit date: 09/07/2020 Discharge date: 09/12/2020  Admitted From: Home Discharge disposition: Home   Code Status: Full Code   Discharge Diagnosis:   Active Problems:   Atrial fibrillation with rapid ventricular response (HCC)   Acute on chronic systolic CHF (congestive heart failure) (HCC)   CHF (congestive heart failure), NYHA class II, unspecified failure chronicity, systolic (HCC)   Atrial fibrillation (HCC)   Subtherapeutic anticoagulation    Chief Complaint  Patient presents with   Chest Pain    Brief narrative: [name] [name] is a 60 y.o. male with PMH significant for morbid obesity, sleep apnea, chronic alcohol use, nonischemic cardiomyopathy, chronic systolic CHF, HTN, COPD on home oxygen, DVT/PE, A. fib on Coumadin, hypothyroidism, chronic pain and recent COVID-19 infection. Patient presented to the ED on 7/24 with shortness of breath, found to be in rapid A. fib and acute decompensated CHF. Patient was admitted to hospitalist service, started on Lasix drip. See below for details  Subjective: Patient was seen and examined this morning. Sitting up in chair.  Not in distress.  Feels better than at presentation.  Wants to go home.  At baseline on 4 L oxygen by nasal cannula.  Hospital course: Acute decompensation of chronic systolic CHF Essential hypertension -Presented with chest pain, shortness of breath, worsening bilateral pedal edema. -Echo from February 2022 had shown an EF of 30 to 35%.  7/16, echocardiogram showed severely decreased LV function with moderately dilated LV, unable to assess EF, severely reduced RV function -Patient was diuresed with IV Lasix.  More than 3 L of diuresis in the hospital.    -Cardiology consultation appreciated.  CHF regimen started including Coreg, Entresto, torsemide, Aldactone.   -Blood  pressure remained stable.  Creatinine improved.  Continue same regimen at home.  Permanent A. Fib RVR at presentation -Initially started on Cardizem drip.  He was later switched to oral Cardizem.  In the face of poor ejection fraction and intermittent bradycardia, Cardizem was stopped. -Currently rate controlled on Coreg -Continue Coumadin to target INR 2-3. Recent Labs  Lab 09/08/20 0735 09/09/20 0645 09/10/20 2992 09/11/20 0535 09/12/20 0546  INR 1.4* 1.6* 2.2* 2.4* 2.2*  Elevated troponin History of nonischemic cardiomyopathy -Troponin elevated likely because of RVR and decompensated CHF.  Per report, cardiac cath in recent past had shown normal coronaries. -Continue aspirin and statin.  Chronic alcohol abuse -Per patient's sister, patient continues to drink 2 get rid of chronic pain -No evidence of alcohol withdrawal in the hospital.  Chronic pain Depression -Continue Robaxin, as needed oxycodone, Cymbalta, trazodone -Currently improving with Xanax 1 mg daily.  Recent COVID-19 infection -Completed 5 days of remdesivir.  -Off isolation from today 7/27  Morbid obesity  -Body mass index is 58.69 kg/m. Patient has been advised to make an attempt to improve diet and exercise patterns to aid in weight loss.  Obstructive sleep apnea -Continue nightly CPAP.  Hypothyroidism -Continue Synthroid  Recurrent hospitalization -Palliative care consultation appreciated   Allergies as of 09/12/2020   No Known Allergies      Medication List     STOP taking these medications    lisinopril 2.5 MG tablet Commonly known as: ZESTRIL   meclizine 25 MG tablet Commonly known as: ANTIVERT   metoprolol succinate 100 MG 24 hr tablet Commonly known as: TOPROL-XL   potassium chloride SA 20 MEQ tablet Commonly known as: KLOR-CON  TAKE these medications    albuterol 108 (90 Base) MCG/ACT inhaler Commonly known as: VENTOLIN HFA Inhale 2 puffs into the lungs every 4  (four) hours as needed for wheezing or shortness of breath.   allopurinol 100 MG tablet Commonly known as: ZYLOPRIM Take 1 tablet (100 mg total) by mouth daily.   ALPRAZolam 1 MG tablet Commonly known as: XANAX Take 1 tablet (1 mg total) by mouth 2 (two) times daily as needed for up to 7 days for anxiety.   aspirin EC 81 MG tablet Take 1 tablet (81 mg total) by mouth daily. Swallow whole.   atorvastatin 40 MG tablet Commonly known as: LIPITOR Take 1 tablet (40 mg total) by mouth daily.   carvedilol 6.25 MG tablet Commonly known as: COREG Take 1 tablet (6.25 mg total) by mouth 2 (two) times daily with a meal.   colchicine 0.6 MG tablet Take 1 tablet (0.6 mg total) by mouth daily.   diclofenac Sodium 1 % Gel Commonly known as: VOLTAREN Apply topically.   DULoxetine 20 MG capsule Commonly known as: CYMBALTA Take 1 capsule (20 mg total) by mouth daily.   guaiFENesin-dextromethorphan 100-10 MG/5ML syrup Commonly known as: ROBITUSSIN DM Take 5 mLs by mouth every 4 (four) hours as needed for cough.   levothyroxine 75 MCG tablet Commonly known as: SYNTHROID Take 1 tablet (75 mcg total) by mouth daily before breakfast.   methocarbamol 750 MG tablet Commonly known as: ROBAXIN Take 1 tablet (750 mg total) by mouth 3 (three) times daily for 7 days.   montelukast 10 MG tablet Commonly known as: SINGULAIR Take 1 tablet (10 mg total) by mouth at bedtime.   multivitamin with minerals Tabs tablet Take 1 tablet by mouth daily.   naphazoline-glycerin 0.012-0.2 % Soln Commonly known as: CLEAR EYES REDNESS Place 1-2 drops into both eyes 4 (four) times daily as needed for eye irritation.   nitroGLYCERIN 0.4 MG SL tablet Commonly known as: NITROSTAT Place 1 tablet under tongue every 5 minutes as needed for chest pain. (No more than 3 doses within 15 minutes)   oxyCODONE-acetaminophen 7.5-325 MG tablet Commonly known as: PERCOCET Take 1 tablet by mouth every 8 (eight) hours as  needed for up to 5 days for severe pain.   sacubitril-valsartan 24-26 MG Commonly known as: ENTRESTO Take 1 tablet by mouth 2 (two) times daily.   spironolactone 25 MG tablet Commonly known as: ALDACTONE Take 0.5 tablets (12.5 mg total) by mouth daily. Start taking on: September 13, 2020   SUMAtriptan 50 MG tablet Commonly known as: IMITREX Take 1 tablet (50 mg total) by mouth every 2 (two) hours as needed for up to 7 days for migraine.   torsemide 20 MG tablet Commonly known as: DEMADEX Take 1 tablet (20 mg total) by mouth daily. Start taking on: September 13, 2020 What changed:  medication strength how much to take when to take this   traZODone 100 MG tablet Commonly known as: DESYREL Take 1 tablet (100 mg total) by mouth at bedtime for 7 days. What changed:  medication strength how much to take   warfarin 5 MG tablet Commonly known as: COUMADIN Take 1 tablet (5 mg total) by mouth daily.               Durable Medical Equipment  (From admission, onward)           Start     Ordered   09/12/20 1416  For home use only DME Hospital bed  Once       Question Answer Comment  Length of Need Lifetime   Bed type Semi-electric   Support Surface: Gel Overlay      09/12/20 1415              Discharge Care Instructions  (From admission, onward)           Start     Ordered   09/12/20 0000  Discharge wound care:       Comments: Moisture Associated Skin Damage Thigh Posterior;Bilateral;Circumferential skin between thighs/posterier thighs/perineum red/purple with open areas, app   09/12/20 1259            Discharge Instructions:  Diet Recommendation: Cardiac diet   Follow with Primary MD [name] CordsKaramalegos, Alexander J, DO in 7 days   Get CBC/BMP checked in next visit within 1 week by PCP or SNF MD ( we routinely change or add medications that can affect your baseline labs and fluid status, therefore we recommend that you get the mentioned basic workup next  visit with your PCP, your PCP may decide not to get them or add new tests based on their clinical decision)  On your next visit with your PCP, please Get Medicines reviewed and adjusted.  Please request your PCP  to go over all Hospital Tests and Procedure/Radiological results at the follow up, please get all Hospital records sent to your Prim MD by signing hospital release before you go home.  Activity: As tolerated with Full fall precautions use walker/cane & assistance as needed  For Heart failure patients - Check your Weight same time everyday, if you gain over 2 pounds, or you develop in leg swelling, experience more shortness of breath or chest pain, call your Primary MD immediately. Follow Cardiac Low Salt Diet and 1.5 lit/day fluid restriction.  If you have smoked or chewed Tobacco in the last 2 yrs please stop smoking, stop any regular Alcohol  and or any Recreational drug use.  If you experience worsening of your admission symptoms, develop shortness of breath, life threatening emergency, suicidal or homicidal thoughts you must seek medical attention immediately by calling 911 or calling your MD immediately  if symptoms less severe.  You Must read complete instructions/literature along with all the possible adverse reactions/side effects for all the Medicines you take and that have been prescribed to you. Take any new Medicines after you have completely understood and accpet all the possible adverse reactions/side effects.   Do not drive, operate heavy machinery, perform activities at heights, swimming or participation in water activities or provide baby sitting services if your were admitted for syncope or siezures until you have seen by Primary MD or a Neurologist and advised to do so again.  Do not drive when taking Pain medications.  Do not take more than prescribed Pain, Sleep and Anxiety Medications  Wear Seat belts while driving.   Please note You were cared for by a  hospitalist during your hospital stay. If you have any questions about your discharge medications or the care you received while you were in the hospital after you are discharged, you can call the unit and asked to speak with the hospitalist on call if the hospitalist that took care of you is not available. Once you are discharged, your primary care physician will handle any further medical issues. Please note that NO REFILLS for any discharge medications will be authorized once you are discharged, as it is imperative that you return to your primary care  physician (or establish a relationship with a primary care physician if you do not have one) for your aftercare needs so that they can reassess your need for medications and monitor your lab values.    Follow ups:    Follow-up Information     [name] Cords, DO Follow up on 09/19/2020.   Specialty: Family Medicine Why: @ 11am Contact information: [address] [place] [address] [phone]         [name], MD Follow up on 10/06/2020.   Specialty: Cardiology Why: @ 2pm Contact information: [address] [place] [address] [phone]                 Wound care:   Wound / Incision (Open or Dehisced) 09/01/20 Skin tear;(IAD) Incontinence Associated Dermatitis;(MASD) Moisture Associated Skin Damage Thigh Posterior;Bilateral;Circumferential skin between thighs/posterier thighs/perineum red/purple with open areas, app (Active)  Date First Assessed/Time First Assessed: 09/01/20 0015   Wound Type: Skin tear;(IAD) Incontinence Associated Dermatitis;(MASD) Moisture Associated Skin Damage  Location: Thigh  Location Orientation: Posterior;Bilateral;Circumferential  Wound Descripti...    Assessments 09/01/2020 12:30 AM 09/09/2020 10:08 PM  Dressing Type Foam - Lift dressing to assess site every shift None  Dressing Changed New --  Dressing Status Clean;Dry;Intact --  Dressing Change Frequency Every 3  days --  Site / Wound Assessment Purple;Pale;Pink;Red;Yellow --  Peri-wound Assessment Intact --  Drainage Amount Scant --  Drainage Description Serous --  Treatment Cleansed;Other (Comment) --     No Linked orders to display     Wound / Incision (Open or Dehisced) 09/01/20 (MASD) Moisture Associated Skin Damage Abdomen Anterior;Bilateral MASD in abdominal skin folds (Active)  Date First Assessed/Time First Assessed: 09/01/20 0015   Wound Type: (MASD) Moisture Associated Skin Damage  Location: Abdomen  Location Orientation: Anterior;Bilateral  Wound Description (Comments): MASD in abdominal skin folds  Present on Admission:...    Assessments 09/01/2020 12:30 AM 09/10/2020  9:00 AM  Dressing Type None Moisture barrier  Site / Wound Assessment Red;Pink Clean;Dry  Peri-wound Assessment Intact --  Treatment Cleansed;Other (Comment) --     No Linked orders to display    Discharge Exam:   Vitals:   09/12/20 1145 09/12/20 1149 09/12/20 1156 09/12/20 1426  BP: 92/70 124/82 96/80 99/74   Pulse: 72  79 62  Resp: 18  18 17   Temp: 97.8 F (36.6 C)  98.1 F (36.7 C) 98 F (36.7 C)  TempSrc:      SpO2: 100%  96% 100%  Weight:      Height:        Body mass index is 58.69 kg/m.  General exam: Pleasant, middle-aged morbidly obese Caucasian male, looks older for his age Skin: No rashes, lesions or ulcers. HEENT: Atraumatic, normocephalic, no obvious bleeding Lungs: Clear to auscultation bilaterally CVS: Rate controlled irregular rhythm, no murmur GI/Abd soft, nontender, nondistended, bowel sound present CNS: Alert, awake, oriented x3 Psychiatry: Anxious Extremities: Bilateral pedal edema improved, currently trace bilaterally  Time coordinating discharge: 35 minutes   The results of significant diagnostics from this hospitalization (including imaging, microbiology, ancillary and laboratory) are listed below for reference.    Procedures and Diagnostic Studies:   DG Chest 1  View  Result Date: 09/07/2020 CLINICAL DATA:  Chest pain. EXAM: CHEST  1 VIEW COMPARISON:  August 30, 2020 FINDINGS: Stable cardiomegaly. No pneumothorax. No nodules or masses. No focal infiltrates. No overt edema. IMPRESSION: No active disease. Electronically Signed   By: Gerome Sam  III M.D   On: 09/07/2020 12:01     Labs:   Basic Metabolic Panel: Recent Labs  Lab 09/07/20 1737 09/08/20 2137 09/10/20 0638 09/11/20 0535 09/12/20 0546  NA 137 134* 133* 135 135  K 3.0* 4.1 5.0 4.4 4.5  CL 83* 84* 84* 91* 93*  CO2 44* 41* 39* 38* 35*  GLUCOSE 139* 151* 94 122* 114*  BUN 18 17 25* 18 24*  CREATININE 0.73 0.92 1.27* 0.73 0.84  CALCIUM 8.5* 9.0 9.3 8.9 8.7*  MG 2.0 1.9  --  2.0  --   PHOS  --  4.7*  --  3.6  --    GFR Estimated Creatinine Clearance: 147 mL/min (by C-G formula based on SCr of 0.84 mg/dL). Liver Function Tests: Recent Labs  Lab 09/07/20 1737 09/08/20 2137  AST 42* 26  ALT 66* 47*  ALKPHOS 60 60  BILITOT 0.9 0.8  PROT 6.4* 6.3*  ALBUMIN 3.2* 3.2*   No results for input(s): LIPASE, AMYLASE in the last 168 hours. No results for input(s): AMMONIA in the last 168 hours. Coagulation profile Recent Labs  Lab 09/08/20 0735 09/09/20 0645 09/10/20 0638 09/11/20 0535 09/12/20 0546  INR 1.4* 1.6* 2.2* 2.4* 2.2*    CBC: Recent Labs  Lab 09/07/20 1737 09/09/20 0645 09/11/20 0535 09/12/20 0546  WBC 7.2 5.2 4.8 5.1  NEUTROABS  --   --  2.8 3.1  HGB 12.7* 13.6 11.1* 11.4*  HCT 38.4* 44.1 34.0* 36.3*  MCV 93.7 96.5 96.3 98.6  PLT 206 222 187 190   Cardiac Enzymes: No results for input(s): CKTOTAL, CKMB, CKMBINDEX, TROPONINI in the last 168 hours. BNP: Invalid input(s): POCBNP CBG: No results for input(s): GLUCAP in the last 168 hours. D-Dimer No results for input(s): DDIMER in the last 72 hours. Hgb A1c No results for input(s): HGBA1C in the last 72 hours. Lipid Profile No results for input(s): CHOL, HDL, LDLCALC, TRIG, CHOLHDL, LDLDIRECT in  the last 72 hours. Thyroid function studies No results for input(s): TSH, T4TOTAL, T3FREE, THYROIDAB in the last 72 hours.  Invalid input(s): FREET3 Anemia work up No results for input(s): VITAMINB12, FOLATE, FERRITIN, TIBC, IRON, RETICCTPCT in the last 72 hours. Microbiology No results found for this or any previous visit (from the past 240 hour(s)).   Signed: Lorin Glass  [facility] 09/12/2020, 3:16 PM

## 2020-09-12 NOTE — TOC Transition Note (Signed)
Transition of Care [facility]) - CM/SW Discharge Note   Patient Details  Name: [name] [name] MRN: [id number] Date of Birth: [birth year]  [facility] of [facility]) CM/SW Contact:  Gildardo Griffes, LCSW Phone Number: 09/12/2020, 2:34 PM   Clinical Narrative:     Patient will DC to: Home with [facility] Anticipated DC date: 09/12/20 Transport by: Wendie Simmer  Per MD patient ready for DC to  home with [facility]. CSW has spoke with Zack with [employer], will work on hospital bed delivery, patient informed. Ambulance transport requested for patient.    Angeline Slim, LCSW     Final next level of care: Home w Home Health Services Barriers to Discharge: No Barriers Identified   Patient Goals and CMS Choice Patient states their goals for this hospitalization and ongoing recovery are:: to go home CMS Medicare.gov Compare Post Acute Care list provided to:: Patient Choice offered to / list presented to : Patient  Discharge Placement                Patient to be transferred to facility by: [facility]   Patient and family notified of of transfer: 09/12/20  Discharge Plan and Services                          HH Arranged: PT, RN, Social Work [employer] Agency: [facility] Date HH Agency Contacted: 09/12/20 Time HH Agency Contacted: 1434 Representative spoke with at [facility] Agency: Barbara Cower  Social Determinants of Health (SDOH) Interventions     Readmission Risk Interventions Readmission Risk Prevention Plan 01/11/2020 12/24/2019 01/25/2019  Transportation Screening Complete Complete Complete  PCP or Specialist Appt within 3-5 Days - - -  HRI or Home Care Consult - - -  Social Work Consult for Recovery Care Planning/Counseling - - -  Palliative Care Screening - - -  Medication Review [employer]) Complete Complete Complete  PCP or Specialist appointment within 3-5 days of discharge Complete Complete Complete  HRI or Home Care Consult - - Complete  SW Recovery Care/Counseling  Consult Complete Complete Complete  Palliative Care Screening Complete Not Applicable Not Applicable  Skilled Nursing Facility Not Applicable Not Applicable Not Applicable

## 2020-09-12 NOTE — Progress Notes (Signed)
ANTICOAGULATION CONSULT NOTE   Pharmacy Consult for Warfarin  Indication: atrial fibrillation  No Known Allergies  Patient Measurements: Height: 5\' 8"  (172.7 cm) Weight: (!) 175.1 kg (386 lb) IBW/kg (Calculated) : 68.4  Vital Signs: Temp: 98.7 F (37.1 C) (07/29 0428) Temp Source: Oral (07/29 0428) BP: 107/61 (07/29 0428) Pulse Rate: 57 (07/29 0428)  Labs: Recent Labs    09/10/20 09/12/20 09/11/20 0535 09/12/20 0546  HGB  --  11.1* 11.4*  HCT  --  34.0* 36.3*  PLT  --  187 190  LABPROT 24.2* 25.7* 24.5*  INR 2.2* 2.4* 2.2*  CREATININE 1.27* 0.73 0.84     Estimated Creatinine Clearance: 147 mL/min (by C-G formula based on SCr of 0.84 mg/dL).   Medical History: Past Medical History:  Diagnosis Date   Allergy    Anxiety    Arthritis    Asthma    Brain damage    Chronic pain of both knees 07/13/2018   Clotting disorder (HCC)    COPD (chronic obstructive pulmonary disease) (HCC)    Depression    GERD (gastroesophageal reflux disease)    HFrEF (heart failure with reduced ejection fraction) (HCC)    a. 03/2018 Echo: EF 25-30%, diff HK. Mod LAE.   History of DVT (deep vein thrombosis)    History of pulmonary embolism    a. Chronic coumadin.   Hypertension    MI (myocardial infarction) (HCC)    Morbid obesity (HCC)    Neck pain 07/13/2018   NICM (nonischemic cardiomyopathy) (HCC)    a. s/p Cath x 3 - reportedly nl cors. Last cath 2019 in [place]; b. a. 03/2018 Echo: EF 25-30%, diff HK.   Persistent atrial fibrillation (HCC)    a. 03/2018 s/p DCCV; b. CHA2DS2VASc = 1-->Xarelto (later changed to warfarin); c. 05/2018 recurrent afib-->Amio initiated.   Sleep apnea    Sleep apnea     Medications:  Medications Prior to Admission  Medication Sig Dispense Refill Last Dose   allopurinol (ZYLOPRIM) 100 MG tablet Take 1 tablet (100 mg total) by mouth daily. 30 tablet 0 09/06/2020   aspirin EC 81 MG tablet Take 81 mg by mouth daily. Swallow whole.   09/06/2020   atorvastatin  (LIPITOR) 40 MG tablet TAKE 1 TABLET BY MOUTH ONCE DAILY 90 tablet 1 09/06/2020   colchicine 0.6 MG tablet Take 1 tablet (0.6 mg total) by mouth daily. 30 tablet 0 09/06/2020   diclofenac Sodium (VOLTAREN) 1 % GEL Apply topically.   prn at prn   DULoxetine (CYMBALTA) 20 MG capsule Take 1 capsule (20 mg total) by mouth daily. 90 capsule 1 09/06/2020   guaiFENesin-dextromethorphan (ROBITUSSIN DM) 100-10 MG/5ML syrup Take 5 mLs by mouth every 4 (four) hours as needed for cough. 118 mL 0 prn at prn   levothyroxine (SYNTHROID) 75 MCG tablet Take 1 tablet (75 mcg total) by mouth daily before breakfast. 30 tablet 3 09/06/2020   lisinopril (ZESTRIL) 2.5 MG tablet Take 2.5 mg by mouth daily.   09/06/2020   meclizine (ANTIVERT) 25 MG tablet Take 0.5-1 tablets (12.5-25 mg total) by mouth 3 (three) times daily as needed for dizziness. 60 tablet 2 prn at prn   methocarbamol (ROBAXIN) 750 MG tablet Take 750 mg by mouth 3 (three) times daily.   09/06/2020   metoprolol succinate (TOPROL-XL) 100 MG 24 hr tablet Take 100 mg by mouth daily.   09/06/2020   montelukast (SINGULAIR) 10 MG tablet Take 1 tablet (10 mg total) by mouth at bedtime. 30  tablet 11 09/06/2020   Multiple Vitamin (MULTIVITAMIN WITH MINERALS) TABS tablet Take 1 tablet by mouth daily.   09/06/2020   naphazoline-glycerin (CLEAR EYES REDNESS) 0.012-0.2 % SOLN Place 1-2 drops into both eyes 4 (four) times daily as needed for eye irritation. 15 mL 0 prn at prn   nitroGLYCERIN (NITROSTAT) 0.4 MG SL tablet Place 1 tablet under tongue every 5 minutes as needed for chest pain. (No more than 3 doses within 15 minutes) 15 tablet 3 prn at prn   [EXPIRED] oxyCODONE-acetaminophen (PERCOCET) 7.5-325 MG tablet Take 1 tablet by mouth every 8 (eight) hours as needed for up to 5 days for severe pain. 15 tablet 0 prn at prn   potassium chloride SA (KLOR-CON) 20 MEQ tablet Take 1 tablet (20 mEq total) by mouth daily. 30 tablet 3 09/06/2020   SUMAtriptan (IMITREX) 50 MG tablet Take  50 mg by mouth every 2 (two) hours as needed for migraine.   prn at prn   torsemide 40 MG TABS Take 80 mg by mouth 2 (two) times daily. 120 tablet 0 09/06/2020   traZODone (DESYREL) 150 MG tablet Take 1 tablet (150 mg total) by mouth at bedtime. 90 tablet 1 09/06/2020   warfarin (COUMADIN) 5 MG tablet Take 5 mg by mouth daily.   09/06/2020   albuterol (VENTOLIN HFA) 108 (90 Base) MCG/ACT inhaler Inhale 2 puffs into the lungs every 4 (four) hours as needed for wheezing or shortness of breath. 8 g 2 prn at prn    Assessment: 60 y.o. male with medical history significant of dCHF, hypertension, hyperlipidemia, diet-controlled diabetes, COPD, hypothyroidism, gout, depression, anxiety, OSA on CPAP, atrial fibrillation, PE/DVT on Coumadin, CAD, alcohol abuse, obesity, who presents with shortness of breath w/u in ED revealed Afib w RVR. Pt has a history of being subtherapeutic on warfarin and had been switched to Eliquis due to non-compliance with INR checks, however, now pt is back on warfarin. Pharmacy consulted to dose warfarin for Afib w/ h/o DVT/PE . Pt was on Warfarin 5 mg PO daily PTA.   Pt is from nursing home so most likely had a dose of warfarin on 7/24.    Medications: Pharmacy technician spoke with pt who confirmed pt takes warfarin 5 mg daily and his last dose was 7/23.    Date    INR      Dose/Comment 7/16     1.1       7.5 mg 7/17     1.4       7.5 mg 7/18     1.8       5 mg (0 >100% meals eaten) 7/19     2.5       5 mg     7/20     2.3       5 mg 7/21     2.3       5 mg --------- 7/23 --- 5mg  PTA 7/24 1.9 (2.5mg  ordered but not given?) 7/25 1.4 7.5mg  (lovenox bridge started) 7/26 1.6 7.5 mg  7/27 2.2 6 mg - lovenox bride discontinued since INR >2 7/28 2.4 6 mg 7/29 2.2 6 mg   Goal of Therapy:  INR 2-3   Plan:  INR is therapeutic. Will give warfarin 6 mg x 1 tonight. Pt is consistently subtherapeutic when admitted, either patient is non-compliant with medication or the dose is  not optimal. May need to be discharged on a high warfairn dose. Pt has a large body weight, which may  require a larger dose of warfarin. Daily INR ordered. CBC at least every 3 days.    Raiford Noble, PharmD  09/12/2020,7:13 AM

## 2020-09-12 NOTE — Progress Notes (Addendum)
    Durable Medical Equipment  (From admission, onward)           Start     Ordered   09/12/20 1416  For home use only DME Hospital bed  Once       Question Answer Comment  Length of Need Lifetime   Bed type Semi-electric   Support Surface: Gel Overlay      09/12/20 1415           Patient has Congestive Heart Failure and needs head elevated 30 degrees Cannot be achieved in a normal bed.

## 2020-09-12 NOTE — Progress Notes (Signed)
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   SUBJECTIVE: The patient reports that he is feeling " tired and sleepy" on today.  The patient denies having any chest pain, worsening dyspnea, peripheral edema or dizziness at this time.  OBJECTIVE: The patient appears chronically ill, but fairly stable and is resting comfortably in bed.  He is utilizing his CPAP machine at this time; however, the patient is on supplemental oxygen via nasal cannula.  The patient continues to be in atrial fibrillation confirmed by the bedside telemetry monitor with a controlled rate in the 80s.  The patient appears to be asymptomatic and he is not in any distress.  Vitals:   09/11/20 1949 09/11/20 2211 09/12/20 0428 09/12/20 0829  BP: (!) 100/58 106/64 107/61 129/85  Pulse: (!) 57 (!) 59 (!) 57 74  Resp: 17  14 20   Temp: 98.1 F (36.7 C)  98.7 F (37.1 C) 97.8 F (36.6 C)  TempSrc: Oral  Oral   SpO2: 99%  98% 93%  Weight:   (!) 175.1 kg   Height:         Intake/Output Summary (Last 24 hours) at 09/12/2020 1142 Last data filed at 09/12/2020 0900 Gross per 24 hour  Intake 3347 ml  Output 2750 ml  Net 597 ml      PHYSICAL EXAM  General: Well developed, well nourished, in no acute distress HEENT:  Normocephalic and atraumatic. PERLL Neck:  No JVD.  Lungs: diminished lungs sounds bilaterally to auscultation, expansion symmetrical, no wheezes, rales, rhonchi Heart: irregular RR . Normal S1 and S2 without  gallops or murmurs.  Abdomen: Bowel sounds are positive, abdomen soft, obese and non-tender  Msk:  Normal strength and tone for age. Extremities: moderated non-pitting BLE edema. No clubbing or cyanosis   Neuro: Alert and oriented X 3. Psych:  Good affect, responds appropriately   LABS: Basic Metabolic Panel: Recent Labs    09/11/20 0535 09/12/20 0546  NA 135 135  K 4.4 4.5  CL 91* 93*  CO2 38* 35*  GLUCOSE 122* 114*  BUN 18 24*  CREATININE 0.73 0.84  CALCIUM 8.9 8.7*  MG 2.0  --   PHOS 3.6  --    Liver Function Tests: No results for input(s): AST, ALT, ALKPHOS, BILITOT, PROT, ALBUMIN in the last 72 hours. No results for input(s): LIPASE, AMYLASE in the last 72 hours. CBC: Recent Labs    09/11/20 0535 09/12/20 0546  WBC 4.8 5.1  NEUTROABS 2.8 3.1  HGB 11.1* 11.4*  HCT 34.0* 36.3*  MCV 96.3 98.6  PLT 187 190   Cardiac Enzymes: No results for input(s): CKTOTAL, CKMB, CKMBINDEX, TROPONINI in the last 72 hours. BNP: Invalid input(s): POCBNP D-Dimer: No results for input(s): DDIMER in the last 72 hours. Hemoglobin A1C: No results for input(s): HGBA1C in the last 72 hours. Fasting Lipid Panel: No results for input(s): CHOL, HDL, LDLCALC, TRIG, CHOLHDL, LDLDIRECT in the last 72 hours. Thyroid Function Tests: No results for input(s): TSH, T4TOTAL, T3FREE, THYROIDAB in the last 72 hours.  Invalid input(s): FREET3 Anemia Panel: No results for input(s): VITAMINB12,  FOLATE, FERRITIN, TIBC, IRON, RETICCTPCT in the last 72 hours.  No results found.   Echo: results from from 08/31/2020:  IMPRESSIONS   1. Left ventricular ejection fraction, by estimation, is unable to  assess%. The left ventricle has severely decreased function. The left  ventricle has no regional wall motion abnormalities. The left ventricular  internal cavity size was moderately  dilated. There is unable to assess left ventricular hypertrophy. Left  ventricular diastolic function could not be  evaluated.   2. Right ventricular systolic function is severely reduced. The right  ventricular size is not well visualized.   3. The mitral valve was not well visualized. unable to assess mitral  valve regurgitation. unable to assess mitral stenosis.   4. The aortic valve was not well visualized. Aortic valve regurgitation  unable to assess. unable to assess.   5. Pulmonic valve regurgitation unable to assess. unable to assess  pulmonic stenosis.   Results from 04/07/2020:  IMPRESSIONS   1. Left ventricular ejection fraction, by estimation, is 30 to 35%. Left  ventricular ejection fraction by PLAX is 21 %. The left ventricle has  moderate to severely decreased function. Left ventricular endocardial  border not optimally defined to evaluate   regional wall motion. The left ventricular internal cavity size was  mildly dilated. Left ventricular diastolic parameters were normal.   2. Right ventricular systolic function is normal. The right ventricular  size is mildly enlarged.   3. Left atrial size was mildly dilated.   4. Right atrial size was mildly dilated.   5. The mitral valve was not well visualized. No evidence of mitral valve  regurgitation.   6. The aortic valve was not well visualized. Aortic valve regurgitation  is not visualized.   TELEMETRY: Atrial fibrillation with HR in the 80's  ASSESSMENT AND PLAN:  Active Problems:   Atrial fibrillation with rapid ventricular response (HCC)   Acute on chronic systolic CHF (congestive heart failure) (HCC)   CHF (congestive heart failure), NYHA class II, unspecified failure chronicity, systolic (HCC)   Atrial fibrillation (HCC)   Subtherapeutic anticoagulation    Atrial fibrillation with RVR, controlled at this time, heart rate is in the 80s, patient continues to be in atrial fibrillation confirmed by the bedside telemetry monitor We will continue anticoagulation with Coumadin therapy for stroke risk reduction.  Pharmacy  consultation appreciated. Will continue rate control with carvedilol.  Patient previously discontinued off of diltiazem drip. Continuous telemetry monitoring until discharge.  Acute on chronic systolic CHF, not severely exacerbated at this time as BNP is not significantly elevated, recent echocardiogram revealed severe LV systolic dysfunction We will continue management with carvedilol, Entresto, spironolactone and torsemide therapy. Monitor daily weights, strict I's and O's, low-sodium heart healthy diet.  Hypokalemia, soft at this time, patient's potassium level is 4.5 on today -Recommend trending CMP and correct electrolytes as indicated.  OSA on CPAP, chronic, fairly stable Recommend nightly use of CPAP machine is indicated and utilizing machine while napping.  Bilateral lower extremity edema, fairly stable, recent ultrasound was negative for DVTs Recommend continuing torsemide therapy. Recommend utilizing bilateral compression socks and elevating the BLE.  Morbid obesity Recommend modest weight loss with portion control and performing daily chair exercises as tolerated. PT OT consult recommended.  Thyroid disease, not exacerbated Agree with current management.  8.  Dyspnea, likely due to underlying COPD and chronic systolic CHF  A.  We will continue CHF management.  B.  Recommend continuing COPD management.   Coraima Tibbs,  ACNPC-AG  09/12/2020 11:42 AM

## 2020-09-13 LAB — CBC WITH DIFFERENTIAL/PLATELET
Abs Immature Granulocytes: 0.06 10*3/uL (ref 0.00–0.07)
Basophils Absolute: 0 10*3/uL (ref 0.0–0.1)
Basophils Relative: 0 %
Eosinophils Absolute: 0.1 10*3/uL (ref 0.0–0.5)
Eosinophils Relative: 2 %
HCT: 38.3 % — ABNORMAL LOW (ref 39.0–52.0)
Hemoglobin: 12.1 g/dL — ABNORMAL LOW (ref 13.0–17.0)
Immature Granulocytes: 1 %
Lymphocytes Relative: 24 %
Lymphs Abs: 1.4 10*3/uL (ref 0.7–4.0)
MCH: 30.1 pg (ref 26.0–34.0)
MCHC: 31.6 g/dL (ref 30.0–36.0)
MCV: 95.3 fL (ref 80.0–100.0)
Monocytes Absolute: 0.7 10*3/uL (ref 0.1–1.0)
Monocytes Relative: 13 %
Neutro Abs: 3.4 10*3/uL (ref 1.7–7.7)
Neutrophils Relative %: 60 %
Platelets: 202 10*3/uL (ref 150–400)
RBC: 4.02 MIL/uL — ABNORMAL LOW (ref 4.22–5.81)
RDW: 16 % — ABNORMAL HIGH (ref 11.5–15.5)
WBC: 5.8 10*3/uL (ref 4.0–10.5)
nRBC: 0 % (ref 0.0–0.2)

## 2020-09-13 LAB — BASIC METABOLIC PANEL
Anion gap: 8 (ref 5–15)
BUN: 27 mg/dL — ABNORMAL HIGH (ref 6–20)
CO2: 35 mmol/L — ABNORMAL HIGH (ref 22–32)
Calcium: 9.1 mg/dL (ref 8.9–10.3)
Chloride: 93 mmol/L — ABNORMAL LOW (ref 98–111)
Creatinine, Ser: 0.88 mg/dL (ref 0.61–1.24)
GFR, Estimated: >60 mL/min (ref >60)
Glucose, Bld: 125 mg/dL — ABNORMAL HIGH (ref 70–99)
Potassium: 4.4 mmol/L (ref 3.5–5.1)
Sodium: 136 mmol/L (ref 135–145)

## 2020-09-13 LAB — PROTIME-INR
INR: 1.9 — ABNORMAL HIGH (ref 0.8–1.2)
Prothrombin Time: 21.4 seconds — ABNORMAL HIGH (ref 11.4–15.2)

## 2020-09-13 MED ORDER — WARFARIN SODIUM 7.5 MG PO TABS
7.5000 mg | ORAL_TABLET | Freq: Once | ORAL | Status: DC
  Filled 2020-09-13: qty 1

## 2020-09-13 NOTE — Progress Notes (Signed)
Patient was all set for discharge yesterday. EMS came late to pick him up and patient refused to go at the late hour Okay to discharge this morning. No [facility] billing for today

## 2020-09-13 NOTE — Progress Notes (Addendum)
Transport being arranged via case management to transport patient home today. Patient spoke with this RN and is 100% agreeable to go with transport to his home as soon as they arrive. This RN updated 2A Research scientist (life sciences) and CM [place] about patient's willingness to go with transport at this time.   Per SW PTAR will not come back to transport patient. [name] [name] is reaching back out to [facility] for possible transport.

## 2020-09-13 NOTE — Progress Notes (Signed)
ANTICOAGULATION CONSULT NOTE   Pharmacy Consult for Warfarin  Indication: atrial fibrillation  No Known Allergies  Patient Measurements: Height: 5\' 8"  (172.7 cm) Weight: (!) 175 kg (385 lb 11.2 oz) IBW/kg (Calculated) : 68.4  Vital Signs: Temp: 98.1 F (36.7 C) (07/30 0817) Temp Source: Oral (07/30 0556) BP: 113/90 (07/30 0817) Pulse Rate: 79 (07/30 0817)  Labs: Recent Labs    09/11/20 0535 09/12/20 0546 09/13/20 0542  HGB 11.1* 11.4* 12.1*  HCT 34.0* 36.3* 38.3*  PLT 187 190 202  LABPROT 25.7* 24.5* 21.4*  INR 2.4* 2.2* 1.9*  CREATININE 0.73 0.84 0.88     Estimated Creatinine Clearance: 140.2 mL/min (by C-G formula based on SCr of 0.88 mg/dL).   Medical History: Past Medical History:  Diagnosis Date   Allergy    Anxiety    Arthritis    Asthma    Brain damage    Chronic pain of both knees 07/13/2018   Clotting disorder (HCC)    COPD (chronic obstructive pulmonary disease) (HCC)    Depression    GERD (gastroesophageal reflux disease)    HFrEF (heart failure with reduced ejection fraction) (HCC)    a. 03/2018 Echo: EF 25-30%, diff HK. Mod LAE.   History of DVT (deep vein thrombosis)    History of pulmonary embolism    a. Chronic coumadin.   Hypertension    MI (myocardial infarction) (HCC)    Morbid obesity (HCC)    Neck pain 07/13/2018   NICM (nonischemic cardiomyopathy) (HCC)    a. s/p Cath x 3 - reportedly nl cors. Last cath 2019 in [place]; b. a. 03/2018 Echo: EF 25-30%, diff HK.   Persistent atrial fibrillation (HCC)    a. 03/2018 s/p DCCV; b. CHA2DS2VASc = 1-->Xarelto (later changed to warfarin); c. 05/2018 recurrent afib-->Amio initiated.   Sleep apnea    Sleep apnea     Medications:  Medications Prior to Admission  Medication Sig Dispense Refill Last Dose   diclofenac Sodium (VOLTAREN) 1 % GEL Apply topically.   prn at prn   lisinopril (ZESTRIL) 2.5 MG tablet Take 2.5 mg by mouth daily.   09/06/2020   meclizine (ANTIVERT) 25 MG tablet Take 0.5-1  tablets (12.5-25 mg total) by mouth 3 (three) times daily as needed for dizziness. 60 tablet 2 prn at prn   metoprolol succinate (TOPROL-XL) 100 MG 24 hr tablet Take 100 mg by mouth daily.   09/06/2020   Multiple Vitamin (MULTIVITAMIN WITH MINERALS) TABS tablet Take 1 tablet by mouth daily.   09/06/2020   naphazoline-glycerin (CLEAR EYES REDNESS) 0.012-0.2 % SOLN Place 1-2 drops into both eyes 4 (four) times daily as needed for eye irritation. 15 mL 0 prn at prn   nitroGLYCERIN (NITROSTAT) 0.4 MG SL tablet Place 1 tablet under tongue every 5 minutes as needed for chest pain. (No more than 3 doses within 15 minutes) 15 tablet 3 prn at prn   potassium chloride SA (KLOR-CON) 20 MEQ tablet Take 1 tablet (20 mEq total) by mouth daily. 30 tablet 3 09/06/2020   torsemide 40 MG TABS Take 80 mg by mouth 2 (two) times daily. 120 tablet 0 09/06/2020   [DISCONTINUED] allopurinol (ZYLOPRIM) 100 MG tablet Take 1 tablet (100 mg total) by mouth daily. 30 tablet 0 09/06/2020   [DISCONTINUED] aspirin EC 81 MG tablet Take 81 mg by mouth daily. Swallow whole.   09/06/2020   [DISCONTINUED] atorvastatin (LIPITOR) 40 MG tablet TAKE 1 TABLET BY MOUTH ONCE DAILY 90 tablet 1 09/06/2020   [  DISCONTINUED] colchicine 0.6 MG tablet Take 1 tablet (0.6 mg total) by mouth daily. 30 tablet 0 09/06/2020   [DISCONTINUED] DULoxetine (CYMBALTA) 20 MG capsule Take 1 capsule (20 mg total) by mouth daily. 90 capsule 1 09/06/2020   [DISCONTINUED] guaiFENesin-dextromethorphan (ROBITUSSIN DM) 100-10 MG/5ML syrup Take 5 mLs by mouth every 4 (four) hours as needed for cough. 118 mL 0 prn at prn   [DISCONTINUED] levothyroxine (SYNTHROID) 75 MCG tablet Take 1 tablet (75 mcg total) by mouth daily before breakfast. 30 tablet 3 09/06/2020   [DISCONTINUED] methocarbamol (ROBAXIN) 750 MG tablet Take 750 mg by mouth 3 (three) times daily.   09/06/2020   [DISCONTINUED] montelukast (SINGULAIR) 10 MG tablet Take 1 tablet (10 mg total) by mouth at bedtime. 30 tablet 11  09/06/2020   [DISCONTINUED] oxyCODONE-acetaminophen (PERCOCET) 7.5-325 MG tablet Take 1 tablet by mouth every 8 (eight) hours as needed for up to 5 days for severe pain. 15 tablet 0 prn at prn   [DISCONTINUED] SUMAtriptan (IMITREX) 50 MG tablet Take 50 mg by mouth every 2 (two) hours as needed for migraine.   prn at prn   [DISCONTINUED] traZODone (DESYREL) 150 MG tablet Take 1 tablet (150 mg total) by mouth at bedtime. 90 tablet 1 09/06/2020   [DISCONTINUED] warfarin (COUMADIN) 5 MG tablet Take 5 mg by mouth daily.   09/06/2020   [DISCONTINUED] albuterol (VENTOLIN HFA) 108 (90 Base) MCG/ACT inhaler Inhale 2 puffs into the lungs every 4 (four) hours as needed for wheezing or shortness of breath. 8 g 2 prn at prn    Assessment: 60 y.o. male with medical history significant of dCHF, hypertension, hyperlipidemia, diet-controlled diabetes, COPD, hypothyroidism, gout, depression, anxiety, OSA on CPAP, atrial fibrillation, PE/DVT on Coumadin, CAD, alcohol abuse, obesity, who presents with shortness of breath w/u in ED revealed Afib w RVR. Pt has a history of being subtherapeutic on warfarin and had been switched to Eliquis due to non-compliance with INR checks, however, now pt is back on warfarin. Pharmacy consulted to dose warfarin for Afib w/ h/o DVT/PE . Pt was on Warfarin 5 mg PO daily PTA.   Pt is from nursing home so most likely had a dose of warfarin on 7/24.    Medications: Pharmacy technician spoke with pt who confirmed pt takes warfarin 5 mg daily and his last dose was 7/23.    Date    INR      Dose/Comment 7/16     1.1       7.5 mg 7/17     1.4       7.5 mg 7/18     1.8       5 mg (0 >100% meals eaten) 7/19     2.5       5 mg     7/20     2.3       5 mg 7/21     2.3       5 mg --------- 7/23 --- 5mg  PTA 7/24 1.9 (2.5mg  ordered but not given?) 7/25 1.4 7.5mg  (lovenox bridge started) 7/26 1.6 7.5 mg  7/27 2.2 6 mg - lovenox bridge discontinued since INR >2 7/28 2.4 6 mg 7/29 2.2 6  mg 7/30 1.9 7.5 mg   Goal of Therapy:  INR 2-3   Plan:  INR is subtherapeutic. Will give warfarin 7.5 mg x 1 tonight based on INR trend & prior response.  Pt is consistently subtherapeutic when admitted, either patient is non-compliant with medication or the dose  is not optimal. May need to be discharged on a high warfairn dose. Pt has a large body weight, which may require a larger dose of warfarin. Daily INR ordered. CBC at least every 3 days.    Martyn Malay, PharmD  09/13/2020,9:04 AM

## 2020-09-13 NOTE — Progress Notes (Signed)
Patient notified this RN that sister in law was coming to transport him back home. Patient taken to medical mall by this RN and NT Byrd Hesselbach. Sister in law acknowledged she was taking patient to his home, inquired about his behavior during admission and updated by this RN. Patient unwilling to add sister in law to contacts for future hospitalizations at this time. Stated "I can reach her if I need to."

## 2020-09-13 NOTE — Progress Notes (Signed)
Per night shift report:   EMS arrived to transport patient home around 8pm, EMS was notified of patient's name, refused to transport patient, stating that he called them frequently and they refused to transport him.   EMS called or arrived again at 11pm. Patient refused to be transported at that time, stating it was too late and he was asleep.   EMS, per report, suppose to transport patient home this morning.   No contact from EMS at this time for update on transport time today.   MD Dahal updated by this RN vis phone conversation on patient's transportation issues.

## 2020-09-15 DIAGNOSIS — I509 Heart failure, unspecified: Secondary | ICD-10-CM | POA: Diagnosis not present

## 2020-09-16 DIAGNOSIS — I509 Heart failure, unspecified: Secondary | ICD-10-CM | POA: Diagnosis not present

## 2020-09-17 ENCOUNTER — Ambulatory Visit: Payer: Medicaid Other | Admitting: Family

## 2020-09-17 DIAGNOSIS — I509 Heart failure, unspecified: Secondary | ICD-10-CM | POA: Diagnosis not present

## 2020-09-18 DIAGNOSIS — I509 Heart failure, unspecified: Secondary | ICD-10-CM | POA: Diagnosis not present

## 2020-09-19 ENCOUNTER — Inpatient Hospital Stay: Payer: Medicaid Other | Admitting: Family Medicine

## 2020-09-19 DIAGNOSIS — I509 Heart failure, unspecified: Secondary | ICD-10-CM | POA: Diagnosis not present

## 2020-09-20 DIAGNOSIS — I509 Heart failure, unspecified: Secondary | ICD-10-CM | POA: Diagnosis not present

## 2020-09-21 DIAGNOSIS — I509 Heart failure, unspecified: Secondary | ICD-10-CM | POA: Diagnosis not present

## 2020-09-22 DIAGNOSIS — I509 Heart failure, unspecified: Secondary | ICD-10-CM | POA: Diagnosis not present

## 2020-09-23 ENCOUNTER — Emergency Department: Payer: Medicaid Other

## 2020-09-23 ENCOUNTER — Inpatient Hospital Stay
Admit: 2020-09-23 | Discharge: 2020-09-23 | Disposition: A | Discharge status: Home / Self Care | Payer: Medicaid Other | Attending: Family Medicine | Admitting: Family Medicine

## 2020-09-23 ENCOUNTER — Inpatient Hospital Stay
Admission: EM | Admit: 2020-09-23 | Discharge: 2020-09-27 | DRG: 291 | Disposition: A | Discharge status: Home Health | Payer: Medicaid Other | Attending: Internal Medicine | Admitting: Internal Medicine

## 2020-09-23 DIAGNOSIS — I517 Cardiomegaly: Secondary | ICD-10-CM | POA: Diagnosis not present

## 2020-09-23 DIAGNOSIS — G8929 Other chronic pain: Secondary | ICD-10-CM | POA: Diagnosis present

## 2020-09-23 DIAGNOSIS — E1151 Type 2 diabetes mellitus with diabetic peripheral angiopathy without gangrene: Secondary | ICD-10-CM | POA: Diagnosis present

## 2020-09-23 DIAGNOSIS — Z6841 Body Mass Index (BMI) 40.0 and over, adult: Secondary | ICD-10-CM

## 2020-09-23 DIAGNOSIS — F1010 Alcohol abuse, uncomplicated: Secondary | ICD-10-CM | POA: Diagnosis present

## 2020-09-23 DIAGNOSIS — B379 Candidiasis, unspecified: Secondary | ICD-10-CM | POA: Diagnosis present

## 2020-09-23 DIAGNOSIS — J432 Centrilobular emphysema: Secondary | ICD-10-CM | POA: Diagnosis not present

## 2020-09-23 DIAGNOSIS — Z91199 Patient's noncompliance with other medical treatment and regimen due to unspecified reason: Secondary | ICD-10-CM

## 2020-09-23 DIAGNOSIS — J9811 Atelectasis: Secondary | ICD-10-CM | POA: Diagnosis not present

## 2020-09-23 DIAGNOSIS — J9611 Chronic respiratory failure with hypoxia: Secondary | ICD-10-CM | POA: Diagnosis present

## 2020-09-23 DIAGNOSIS — E872 Acidosis, unspecified: Secondary | ICD-10-CM

## 2020-09-23 DIAGNOSIS — E1169 Type 2 diabetes mellitus with other specified complication: Secondary | ICD-10-CM | POA: Diagnosis present

## 2020-09-23 DIAGNOSIS — Z8616 Personal history of COVID-19: Secondary | ICD-10-CM

## 2020-09-23 DIAGNOSIS — R4922 Hyponasality: Secondary | ICD-10-CM

## 2020-09-23 DIAGNOSIS — I428 Other cardiomyopathies: Secondary | ICD-10-CM | POA: Diagnosis present

## 2020-09-23 DIAGNOSIS — Z7982 Long term (current) use of aspirin: Secondary | ICD-10-CM

## 2020-09-23 DIAGNOSIS — Z7901 Long term (current) use of anticoagulants: Secondary | ICD-10-CM

## 2020-09-23 DIAGNOSIS — I482 Chronic atrial fibrillation, unspecified: Secondary | ICD-10-CM | POA: Diagnosis present

## 2020-09-23 DIAGNOSIS — Z79899 Other long term (current) drug therapy: Secondary | ICD-10-CM

## 2020-09-23 DIAGNOSIS — F3341 Major depressive disorder, recurrent, in partial remission: Secondary | ICD-10-CM | POA: Diagnosis present

## 2020-09-23 DIAGNOSIS — I10 Essential (primary) hypertension: Secondary | ICD-10-CM | POA: Diagnosis present

## 2020-09-23 DIAGNOSIS — I4891 Unspecified atrial fibrillation: Secondary | ICD-10-CM | POA: Diagnosis present

## 2020-09-23 DIAGNOSIS — R778 Other specified abnormalities of plasma proteins: Secondary | ICD-10-CM

## 2020-09-23 DIAGNOSIS — E662 Morbid (severe) obesity with alveolar hypoventilation: Secondary | ICD-10-CM | POA: Diagnosis present

## 2020-09-23 DIAGNOSIS — Z86718 Personal history of other venous thrombosis and embolism: Secondary | ICD-10-CM

## 2020-09-23 DIAGNOSIS — I4821 Permanent atrial fibrillation: Secondary | ICD-10-CM | POA: Diagnosis present

## 2020-09-23 DIAGNOSIS — I5033 Acute on chronic diastolic (congestive) heart failure: Secondary | ICD-10-CM | POA: Diagnosis not present

## 2020-09-23 DIAGNOSIS — D6959 Other secondary thrombocytopenia: Secondary | ICD-10-CM | POA: Diagnosis present

## 2020-09-23 DIAGNOSIS — E441 Mild protein-calorie malnutrition: Secondary | ICD-10-CM | POA: Diagnosis present

## 2020-09-23 DIAGNOSIS — I110 Hypertensive heart disease with heart failure: Secondary | ICD-10-CM | POA: Diagnosis present

## 2020-09-23 DIAGNOSIS — E039 Hypothyroidism, unspecified: Secondary | ICD-10-CM | POA: Diagnosis present

## 2020-09-23 DIAGNOSIS — R0689 Other abnormalities of breathing: Secondary | ICD-10-CM | POA: Diagnosis not present

## 2020-09-23 DIAGNOSIS — I959 Hypotension, unspecified: Secondary | ICD-10-CM

## 2020-09-23 DIAGNOSIS — I890 Lymphedema, not elsewhere classified: Secondary | ICD-10-CM

## 2020-09-23 DIAGNOSIS — R069 Unspecified abnormalities of breathing: Secondary | ICD-10-CM | POA: Diagnosis not present

## 2020-09-23 DIAGNOSIS — Z9981 Dependence on supplemental oxygen: Secondary | ICD-10-CM

## 2020-09-23 DIAGNOSIS — J9610 Chronic respiratory failure, unspecified whether with hypoxia or hypercapnia: Secondary | ICD-10-CM | POA: Diagnosis not present

## 2020-09-23 DIAGNOSIS — Z8249 Family history of ischemic heart disease and other diseases of the circulatory system: Secondary | ICD-10-CM

## 2020-09-23 DIAGNOSIS — K760 Fatty (change of) liver, not elsewhere classified: Secondary | ICD-10-CM | POA: Diagnosis not present

## 2020-09-23 DIAGNOSIS — R570 Cardiogenic shock: Secondary | ICD-10-CM | POA: Diagnosis present

## 2020-09-23 DIAGNOSIS — Z79890 Hormone replacement therapy (postmenopausal): Secondary | ICD-10-CM

## 2020-09-23 DIAGNOSIS — E871 Hypo-osmolality and hyponatremia: Secondary | ICD-10-CM | POA: Diagnosis present

## 2020-09-23 DIAGNOSIS — I509 Heart failure, unspecified: Secondary | ICD-10-CM | POA: Diagnosis not present

## 2020-09-23 DIAGNOSIS — J441 Chronic obstructive pulmonary disease with (acute) exacerbation: Secondary | ICD-10-CM

## 2020-09-23 DIAGNOSIS — E1165 Type 2 diabetes mellitus with hyperglycemia: Secondary | ICD-10-CM | POA: Diagnosis present

## 2020-09-23 DIAGNOSIS — I5043 Acute on chronic combined systolic (congestive) and diastolic (congestive) heart failure: Secondary | ICD-10-CM | POA: Diagnosis not present

## 2020-09-23 DIAGNOSIS — M109 Gout, unspecified: Secondary | ICD-10-CM | POA: Diagnosis present

## 2020-09-23 DIAGNOSIS — R079 Chest pain, unspecified: Secondary | ICD-10-CM | POA: Diagnosis not present

## 2020-09-23 DIAGNOSIS — R000 Tachycardia, unspecified: Secondary | ICD-10-CM | POA: Diagnosis not present

## 2020-09-23 DIAGNOSIS — I5021 Acute systolic (congestive) heart failure: Secondary | ICD-10-CM | POA: Diagnosis not present

## 2020-09-23 DIAGNOSIS — Z87440 Personal history of urinary (tract) infections: Secondary | ICD-10-CM

## 2020-09-23 DIAGNOSIS — L89212 Pressure ulcer of right hip, stage 2: Secondary | ICD-10-CM

## 2020-09-23 DIAGNOSIS — Z9119 Patient's noncompliance with other medical treatment and regimen: Secondary | ICD-10-CM

## 2020-09-23 DIAGNOSIS — R0602 Shortness of breath: Secondary | ICD-10-CM

## 2020-09-23 DIAGNOSIS — D638 Anemia in other chronic diseases classified elsewhere: Secondary | ICD-10-CM | POA: Diagnosis present

## 2020-09-23 DIAGNOSIS — R197 Diarrhea, unspecified: Secondary | ICD-10-CM | POA: Diagnosis present

## 2020-09-23 DIAGNOSIS — E876 Hypokalemia: Secondary | ICD-10-CM | POA: Diagnosis present

## 2020-09-23 DIAGNOSIS — J811 Chronic pulmonary edema: Secondary | ICD-10-CM | POA: Diagnosis not present

## 2020-09-23 DIAGNOSIS — J449 Chronic obstructive pulmonary disease, unspecified: Secondary | ICD-10-CM | POA: Diagnosis present

## 2020-09-23 DIAGNOSIS — R0902 Hypoxemia: Secondary | ICD-10-CM | POA: Diagnosis not present

## 2020-09-23 DIAGNOSIS — Z801 Family history of malignant neoplasm of trachea, bronchus and lung: Secondary | ICD-10-CM

## 2020-09-23 DIAGNOSIS — J9600 Acute respiratory failure, unspecified whether with hypoxia or hypercapnia: Secondary | ICD-10-CM | POA: Diagnosis not present

## 2020-09-23 LAB — COMPREHENSIVE METABOLIC PANEL
ALT: 16 U/L (ref 0–44)
AST: 25 U/L (ref 15–41)
Albumin: 2.8 g/dL — ABNORMAL LOW (ref 3.5–5.0)
Alkaline Phosphatase: 87 U/L (ref 38–126)
Anion gap: 15 (ref 5–15)
BUN: 15 mg/dL (ref 6–20)
CO2: 27 mmol/L (ref 22–32)
Calcium: 7.9 mg/dL — ABNORMAL LOW (ref 8.9–10.3)
Chloride: 95 mmol/L — ABNORMAL LOW (ref 98–111)
Creatinine, Ser: 0.58 mg/dL — ABNORMAL LOW (ref 0.61–1.24)
GFR, Estimated: >60 mL/min (ref >60)
Glucose, Bld: 146 mg/dL — ABNORMAL HIGH (ref 70–99)
Potassium: 4 mmol/L (ref 3.5–5.1)
Sodium: 137 mmol/L (ref 135–145)
Total Bilirubin: 0.8 mg/dL (ref 0.3–1.2)
Total Protein: 6.2 g/dL — ABNORMAL LOW (ref 6.5–8.1)

## 2020-09-23 LAB — BLOOD GAS, VENOUS
Acid-Base Excess: 2.3 mmol/L — ABNORMAL HIGH (ref 0.0–2.0)
Bicarbonate: 28.3 mmol/L — ABNORMAL HIGH (ref 20.0–28.0)
O2 Saturation: 85.3 %
Patient temperature: 37
pCO2, Ven: 49 mmHg (ref 44.0–60.0)
pH, Ven: 7.37 (ref 7.250–7.430)
pO2, Ven: 52 mmHg — ABNORMAL HIGH (ref 32.0–45.0)

## 2020-09-23 LAB — TROPONIN I (HIGH SENSITIVITY)
Troponin I (High Sensitivity): 37 ng/L — ABNORMAL HIGH (ref <18)
Troponin I (High Sensitivity): 38 ng/L — ABNORMAL HIGH (ref <18)

## 2020-09-23 LAB — PROTIME-INR
INR: 1.2 (ref 0.8–1.2)
Prothrombin Time: 15.1 seconds (ref 11.4–15.2)

## 2020-09-23 LAB — LACTIC ACID, PLASMA
Lactic Acid, Venous: 4.2 mmol/L (ref 0.5–1.9)
Lactic Acid, Venous: 4.2 mmol/L (ref 0.5–1.9)

## 2020-09-23 LAB — BRAIN NATRIURETIC PEPTIDE: B Natriuretic Peptide: 133.4 pg/mL — ABNORMAL HIGH (ref 0.0–100.0)

## 2020-09-23 LAB — CBC WITH DIFFERENTIAL/PLATELET
Abs Immature Granulocytes: 0.05 10*3/uL (ref 0.00–0.07)
Basophils Absolute: 0 10*3/uL (ref 0.0–0.1)
Basophils Relative: 1 %
Eosinophils Absolute: 0.3 10*3/uL (ref 0.0–0.5)
Eosinophils Relative: 4 %
HCT: 35.4 % — ABNORMAL LOW (ref 39.0–52.0)
Hemoglobin: 11.7 g/dL — ABNORMAL LOW (ref 13.0–17.0)
Immature Granulocytes: 1 %
Lymphocytes Relative: 12 %
Lymphs Abs: 1 10*3/uL (ref 0.7–4.0)
MCH: 30.3 pg (ref 26.0–34.0)
MCHC: 33.1 g/dL (ref 30.0–36.0)
MCV: 91.7 fL (ref 80.0–100.0)
Monocytes Absolute: 0.7 10*3/uL (ref 0.1–1.0)
Monocytes Relative: 8 %
Neutro Abs: 6.6 10*3/uL (ref 1.7–7.7)
Neutrophils Relative %: 74 %
Platelets: 238 10*3/uL (ref 150–400)
RBC: 3.86 MIL/uL — ABNORMAL LOW (ref 4.22–5.81)
RDW: 16 % — ABNORMAL HIGH (ref 11.5–15.5)
WBC: 8.6 10*3/uL (ref 4.0–10.5)
nRBC: 0 % (ref 0.0–0.2)

## 2020-09-23 LAB — MAGNESIUM: Magnesium: 1.5 mg/dL — ABNORMAL LOW (ref 1.7–2.4)

## 2020-09-23 LAB — CBG MONITORING, ED: Glucose-Capillary: 119 mg/dL — ABNORMAL HIGH (ref 70–99)

## 2020-09-23 LAB — ECHOCARDIOGRAM COMPLETE
Height: 68 in
S' Lateral: 4.88 cm
Weight: 6160 oz

## 2020-09-23 LAB — D-DIMER, QUANTITATIVE: D-Dimer, Quant: 2.17 ug/mL-FEU — ABNORMAL HIGH (ref 0.00–0.50)

## 2020-09-23 LAB — PROCALCITONIN: Procalcitonin: <0.1 ng/mL

## 2020-09-23 MED ORDER — THIAMINE HCL 100 MG PO TABS
100.0000 mg | ORAL_TABLET | Freq: Every day | ORAL | Status: DC
  Administered 2020-09-23 – 2020-09-27 (×4): 100 mg via ORAL
  Filled 2020-09-23 (×4): qty 1

## 2020-09-23 MED ORDER — AMIODARONE HCL IN DEXTROSE 360-4.14 MG/200ML-% IV SOLN
30.0000 mg/h | INTRAVENOUS | Status: DC
  Administered 2020-09-24 – 2020-09-25 (×3): 30 mg/h via INTRAVENOUS
  Filled 2020-09-23 (×3): qty 200

## 2020-09-23 MED ORDER — THIAMINE HCL 100 MG/ML IJ SOLN
100.0000 mg | Freq: Every day | INTRAMUSCULAR | Status: DC
  Administered 2020-09-26: 100 mg via INTRAVENOUS
  Filled 2020-09-23 (×2): qty 2

## 2020-09-23 MED ORDER — ACETAMINOPHEN 325 MG PO TABS
650.0000 mg | ORAL_TABLET | Freq: Four times a day (QID) | ORAL | Status: DC | PRN
  Administered 2020-09-24: 650 mg via ORAL
  Filled 2020-09-23: qty 2

## 2020-09-23 MED ORDER — DILTIAZEM LOAD VIA INFUSION
10.0000 mg | Freq: Once | INTRAVENOUS | Status: DC
  Filled 2020-09-23: qty 10

## 2020-09-23 MED ORDER — DILTIAZEM HCL-DEXTROSE 125-5 MG/125ML-% IV SOLN (PREMIX)
5.0000 mg/h | INTRAVENOUS | Status: DC
  Administered 2020-09-23: 5 mg/h via INTRAVENOUS
  Filled 2020-09-23: qty 125

## 2020-09-23 MED ORDER — SACUBITRIL-VALSARTAN 24-26 MG PO TABS
1.0000 | ORAL_TABLET | Freq: Two times a day (BID) | ORAL | Status: DC
  Administered 2020-09-24 – 2020-09-27 (×7): 1 via ORAL
  Filled 2020-09-23 (×9): qty 1

## 2020-09-23 MED ORDER — DILTIAZEM HCL-DEXTROSE 125-5 MG/125ML-% IV SOLN (PREMIX): 5.0000 mg/h | INTRAVENOUS | Status: DC

## 2020-09-23 MED ORDER — POTASSIUM CHLORIDE CRYS ER 20 MEQ PO TBCR
10.0000 meq | EXTENDED_RELEASE_TABLET | Freq: Two times a day (BID) | ORAL | Status: DC
  Administered 2020-09-23 – 2020-09-24 (×2): 10 meq via ORAL
  Filled 2020-09-23 (×3): qty 1

## 2020-09-23 MED ORDER — SPIRONOLACTONE 25 MG PO TABS
12.5000 mg | ORAL_TABLET | Freq: Every day | ORAL | Status: DC
  Administered 2020-09-24 – 2020-09-27 (×4): 12.5 mg via ORAL
  Filled 2020-09-23 (×3): qty 1
  Filled 2020-09-23 (×2): qty 0.5
  Filled 2020-09-23: qty 1
  Filled 2020-09-23: qty 0.5

## 2020-09-23 MED ORDER — AMIODARONE HCL IN DEXTROSE 360-4.14 MG/200ML-% IV SOLN
60.0000 mg/h | INTRAVENOUS | Status: DC
  Administered 2020-09-23 (×2): 60 mg/h via INTRAVENOUS
  Filled 2020-09-23 (×2): qty 200

## 2020-09-23 MED ORDER — LORAZEPAM 2 MG/ML IJ SOLN
1.0000 mg | INTRAMUSCULAR | Status: AC | PRN
  Administered 2020-09-23: 1 mg via INTRAVENOUS
  Administered 2020-09-23: 3 mg via INTRAVENOUS
  Administered 2020-09-23 – 2020-09-25 (×2): 2 mg via INTRAVENOUS
  Filled 2020-09-23: qty 1
  Filled 2020-09-23: qty 2
  Filled 2020-09-23 (×2): qty 1

## 2020-09-23 MED ORDER — INSULIN ASPART 100 UNIT/ML IJ SOLN
0.0000 [IU] | Freq: Three times a day (TID) | INTRAMUSCULAR | Status: DC
  Administered 2020-09-24: 1 [IU] via SUBCUTANEOUS
  Administered 2020-09-24 (×2): 2 [IU] via SUBCUTANEOUS
  Administered 2020-09-25 – 2020-09-26 (×5): 1 [IU] via SUBCUTANEOUS
  Filled 2020-09-23 (×8): qty 1

## 2020-09-23 MED ORDER — WARFARIN SODIUM 5 MG PO TABS
5.0000 mg | ORAL_TABLET | Freq: Once | ORAL | Status: AC
  Administered 2020-09-23: 5 mg via ORAL
  Filled 2020-09-23 (×2): qty 1

## 2020-09-23 MED ORDER — LEVOTHYROXINE SODIUM 50 MCG PO TABS
75.0000 ug | ORAL_TABLET | Freq: Every day | ORAL | Status: DC
  Administered 2020-09-24 – 2020-09-27 (×4): 75 ug via ORAL
  Filled 2020-09-23: qty 2
  Filled 2020-09-23 (×2): qty 1

## 2020-09-23 MED ORDER — FOLIC ACID 1 MG PO TABS
1.0000 mg | ORAL_TABLET | Freq: Every day | ORAL | Status: DC
  Administered 2020-09-23 – 2020-09-27 (×5): 1 mg via ORAL
  Filled 2020-09-23 (×5): qty 1

## 2020-09-23 MED ORDER — LORAZEPAM 1 MG PO TABS
1.0000 mg | ORAL_TABLET | ORAL | Status: AC | PRN
  Administered 2020-09-23 – 2020-09-26 (×4): 1 mg via ORAL
  Filled 2020-09-23 (×4): qty 1

## 2020-09-23 MED ORDER — ONDANSETRON HCL 4 MG/2ML IJ SOLN
4.0000 mg | Freq: Four times a day (QID) | INTRAMUSCULAR | Status: DC | PRN
  Administered 2020-09-24: 4 mg via INTRAVENOUS
  Filled 2020-09-23: qty 2

## 2020-09-23 MED ORDER — ADULT MULTIVITAMIN W/MINERALS CH
1.0000 | ORAL_TABLET | Freq: Every day | ORAL | Status: DC
  Administered 2020-09-23 – 2020-09-27 (×5): 1 via ORAL
  Filled 2020-09-23 (×5): qty 1

## 2020-09-23 MED ORDER — IOHEXOL 350 MG/ML SOLN
75.0000 mL | Freq: Once | INTRAVENOUS | Status: AC | PRN
  Administered 2020-09-23: 75 mL via INTRAVENOUS

## 2020-09-23 MED ORDER — ACETAMINOPHEN 650 MG RE SUPP: 650.0000 mg | Freq: Four times a day (QID) | RECTAL | Status: DC | PRN

## 2020-09-23 MED ORDER — FUROSEMIDE 10 MG/ML IJ SOLN
60.0000 mg | Freq: Two times a day (BID) | INTRAMUSCULAR | Status: DC
  Administered 2020-09-23 – 2020-09-26 (×6): 60 mg via INTRAVENOUS
  Filled 2020-09-23: qty 8
  Filled 2020-09-23: qty 6
  Filled 2020-09-23 (×2): qty 8
  Filled 2020-09-23 (×2): qty 6

## 2020-09-23 MED ORDER — ATORVASTATIN CALCIUM 20 MG PO TABS
40.0000 mg | ORAL_TABLET | Freq: Every day | ORAL | Status: DC
  Administered 2020-09-24 – 2020-09-27 (×4): 40 mg via ORAL
  Filled 2020-09-23 (×4): qty 2

## 2020-09-23 MED ORDER — AMIODARONE IV BOLUS ONLY 150 MG/100ML
150.0000 mg | Freq: Once | INTRAVENOUS | Status: AC
  Administered 2020-09-23: 150 mg via INTRAVENOUS
  Filled 2020-09-23: qty 100

## 2020-09-23 MED ORDER — LACTATED RINGERS IV BOLUS: 500.0000 mL | Freq: Once | INTRAVENOUS | Status: DC

## 2020-09-23 MED ORDER — COLCHICINE 0.6 MG PO TABS
0.6000 mg | ORAL_TABLET | Freq: Every day | ORAL | Status: DC
  Administered 2020-09-24 – 2020-09-27 (×4): 0.6 mg via ORAL
  Filled 2020-09-23 (×4): qty 1

## 2020-09-23 MED ORDER — TORSEMIDE 20 MG PO TABS
20.0000 mg | ORAL_TABLET | Freq: Every day | ORAL | Status: DC
  Administered 2020-09-23: 20 mg via ORAL
  Filled 2020-09-23: qty 1

## 2020-09-23 MED ORDER — MONTELUKAST SODIUM 10 MG PO TABS
10.0000 mg | ORAL_TABLET | Freq: Every day | ORAL | Status: DC
  Administered 2020-09-24 – 2020-09-26 (×3): 10 mg via ORAL
  Filled 2020-09-23 (×4): qty 1

## 2020-09-23 MED ORDER — SODIUM CHLORIDE 0.9 % IV SOLN
100.0000 mg | Freq: Once | INTRAVENOUS | Status: AC
  Administered 2020-09-23: 100 mg via INTRAVENOUS
  Filled 2020-09-23: qty 100

## 2020-09-23 MED ORDER — ONDANSETRON HCL 4 MG PO TABS: 4.0000 mg | ORAL_TABLET | Freq: Four times a day (QID) | ORAL | Status: DC | PRN

## 2020-09-23 MED ORDER — DILTIAZEM LOAD VIA INFUSION
10.0000 mg | INTRAVENOUS | Status: AC
  Administered 2020-09-23: 10 mg via INTRAVENOUS
  Filled 2020-09-23: qty 10

## 2020-09-23 MED ORDER — MAGNESIUM SULFATE 2 GM/50ML IV SOLN: 2.0000 g | Freq: Once | INTRAVENOUS | Status: DC

## 2020-09-23 MED ORDER — AMIODARONE LOAD VIA INFUSION
150.0000 mg | Freq: Once | INTRAVENOUS | Status: AC
  Administered 2020-09-23: 150 mg via INTRAVENOUS
  Filled 2020-09-23: qty 83.34

## 2020-09-23 MED ORDER — METOPROLOL TARTRATE 5 MG/5ML IV SOLN
5.0000 mg | Freq: Once | INTRAVENOUS | Status: AC
  Administered 2020-09-23: 5 mg via INTRAVENOUS
  Filled 2020-09-23: qty 5

## 2020-09-23 MED ORDER — INSULIN ASPART 100 UNIT/ML IJ SOLN
0.0000 [IU] | Freq: Every day | INTRAMUSCULAR | Status: DC
Start: 2020-09-23 — End: 2020-09-27

## 2020-09-23 MED ORDER — CARVEDILOL 6.25 MG PO TABS: 6.2500 mg | ORAL_TABLET | Freq: Two times a day (BID) | ORAL | Status: DC

## 2020-09-23 MED ORDER — MAGNESIUM SULFATE 4 GM/100ML IV SOLN
4.0000 g | Freq: Once | INTRAVENOUS | Status: AC
  Administered 2020-09-23: 4 g via INTRAVENOUS
  Filled 2020-09-23: qty 100

## 2020-09-23 MED ORDER — FENTANYL CITRATE (PF) 100 MCG/2ML IJ SOLN
50.0000 ug | Freq: Once | INTRAMUSCULAR | Status: AC
Start: 2020-09-23 — End: 2020-09-23
  Administered 2020-09-23: 50 ug via INTRAVENOUS
  Filled 2020-09-23: qty 2

## 2020-09-23 MED ORDER — ALBUTEROL SULFATE (2.5 MG/3ML) 0.083% IN NEBU
2.5000 mg | INHALATION_SOLUTION | RESPIRATORY_TRACT | Status: DC | PRN
  Administered 2020-09-24: 2.5 mg via RESPIRATORY_TRACT
  Filled 2020-09-23: qty 3

## 2020-09-23 MED ORDER — WARFARIN - PHARMACIST DOSING INPATIENT
Freq: Every day | Status: DC
  Filled 2020-09-23: qty 1

## 2020-09-23 MED ORDER — DULOXETINE HCL 20 MG PO CPEP
20.0000 mg | ORAL_CAPSULE | Freq: Every day | ORAL | Status: DC
  Administered 2020-09-24 – 2020-09-27 (×4): 20 mg via ORAL
  Filled 2020-09-23 (×4): qty 1

## 2020-09-23 MED ORDER — ALLOPURINOL 100 MG PO TABS
100.0000 mg | ORAL_TABLET | Freq: Every day | ORAL | Status: DC
  Administered 2020-09-24 – 2020-09-27 (×4): 100 mg via ORAL
  Filled 2020-09-23 (×4): qty 1
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
## 2020-09-23 NOTE — ED Notes (Signed)
Pt transported to CT via stretcher at this time.  

## 2020-09-23 NOTE — ED Triage Notes (Signed)
Pt BIB [facility] from home C/O SOB, diarrhea, generally feeling unwell.    BG 128 20 G RAC  4 mg Zofran

## 2020-09-23 NOTE — ED Notes (Signed)
Pt refuses to leave BP cuff on. Pt reminded that he is in the ED and we are required to monitor him closely. No evidence of understanding displayed.

## 2020-09-23 NOTE — Consult Note (Signed)
Cardiology Consultation Note    Patient ID: Tremain Rucinski, MRN: [id number], DOB/AGE: 60/17/1962 60 y.o. Admit date: 09/23/2020   Date of Consult: 09/23/2020 Primary Physician: [name], DO Primary Cardiologist: Dr. Juliann Pares  Chief Complaint: chf Reason for Consultation: chf/hypotension Requesting MD: Dr. Maryfrances Bunnell  HPI: Oneil Behney is a 60 y.o. male with history of morbid obesity (BMI is 58.54 kg/m) obstructive sleep apnea, COPD, HFR EF with an ejection fraction of 30 to 35% by echo in February 2022, chronic respiratory failure on 4 L of oxygen at home, on warfarin for venous thromboembolism, lymphedema, hypothyroidism and ongoing alcohol abuse with medical noncompliance.  He presents emergency room with shortness of breath.  He was discharged from the hospital 10 days ago where he was admitted for similar problems.  Patient is a very difficult historian.  It is unclear whether he is compliant with his medications.  He is unaware of what he is on. CXR revealed central pulmonary vascular congestion with stable cardiomegaly. Chest ct showed no pe with moderate gloal cardiomegaly and bibasilar stenosis. EKG showed afib with rapid vr. Troponins flat and do not appear to represent acute ischemia. He complaiins of sob but no chest pain  Past Medical History:  Diagnosis Date   Allergy    Anxiety    Arthritis    Asthma    Brain damage    Chronic pain of both knees 07/13/2018   Clotting disorder (HCC)    COPD (chronic obstructive pulmonary disease) (HCC)    Depression    GERD (gastroesophageal reflux disease)    HFrEF (heart failure with reduced ejection fraction) (HCC)    a. 03/2018 Echo: EF 25-30%, diff HK. Mod LAE.   History of DVT (deep vein thrombosis)    History of pulmonary embolism    a. Chronic coumadin.   Hypertension    MI (myocardial infarction) (HCC)    Morbid obesity (HCC)    Neck pain 07/13/2018   NICM (nonischemic cardiomyopathy) (HCC)    a.  s/p Cath x 3 - reportedly nl cors. Last cath 2019 in [place]; b. a. 03/2018 Echo: EF 25-30%, diff HK.   Persistent atrial fibrillation (HCC)    a. 03/2018 s/p DCCV; b. CHA2DS2VASc = 1-->Xarelto (later changed to warfarin); c. 05/2018 recurrent afib-->Amio initiated.   Sleep apnea    Sleep apnea       Surgical History:  Past Surgical History:  Procedure Laterality Date   CARDIOVERSION N/A 03/24/2018   Procedure: CARDIOVERSION;  Surgeon: Antonieta Iba, MD;  Location: [facility] ORS;  Service: Cardiovascular;  Laterality: N/A;   CARDIOVERSION N/A 08/08/2018   Procedure: CARDIOVERSION;  Surgeon: [name], MD;  Location: [facility] ORS;  Service: Cardiovascular;  Laterality: N/A;   hearnia repair     X 3- total of two surgeries   HERNIA REPAIR     LEG SURGERY       Home Meds: Prior to Admission medications   Medication Sig Start Date End Date Taking? Authorizing Provider  albuterol (VENTOLIN HFA) 108 (90 Base) MCG/ACT inhaler Inhale 2 puffs into the lungs every 4 (four) hours as needed for wheezing or shortness of breath. 09/12/20 12/11/20  Dahal, Melina Schools, MD  allopurinol (ZYLOPRIM) 100 MG tablet Take 1 tablet (100 mg total) by mouth daily. 09/12/20 12/11/20  Lorin Glass, MD  aspirin EC 81 MG tablet Take 1 tablet (81 mg total) by mouth daily. Swallow whole. 09/12/20 12/11/20  Lorin Glass, MD  atorvastatin (LIPITOR) 40 MG tablet Take  1 tablet (40 mg total) by mouth daily. 09/12/20 12/11/20  Lorin Glassahal, Binaya, MD  carvedilol (COREG) 6.25 MG tablet Take 1 tablet (6.25 mg total) by mouth 2 (two) times daily with a meal. 09/12/20 12/11/20  Dahal, Melina SchoolsBinaya, MD  colchicine 0.6 MG tablet Take 1 tablet (0.6 mg total) by mouth daily. 09/12/20 12/11/20  Lorin Glassahal, Binaya, MD  diclofenac Sodium (VOLTAREN) 1 % GEL Apply topically. 08/14/20   [provider]  DULoxetine (CYMBALTA) 20 MG capsule Take 1 capsule (20 mg total) by mouth daily. 09/12/20 12/11/20  Dahal, Melina SchoolsBinaya, MD  guaiFENesin-dextromethorphan (ROBITUSSIN DM)  100-10 MG/5ML syrup Take 5 mLs by mouth every 4 (four) hours as needed for cough. 09/12/20   Lorin Glassahal, Binaya, MD  levothyroxine (SYNTHROID) 75 MCG tablet Take 1 tablet (75 mcg total) by mouth daily before breakfast. 09/12/20 12/11/20  Dahal, Melina SchoolsBinaya, MD  montelukast (SINGULAIR) 10 MG tablet Take 1 tablet (10 mg total) by mouth at bedtime. 09/12/20 12/11/20  Lorin Glassahal, Binaya, MD  Multiple Vitamin (MULTIVITAMIN WITH MINERALS) TABS tablet Take 1 tablet by mouth daily.    [provider]  naphazoline-glycerin (CLEAR EYES REDNESS) 0.012-0.2 % SOLN Place 1-2 drops into both eyes 4 (four) times daily as needed for eye irritation. 08/14/19   Alford HighlandWieting, Richard, MD  nitroGLYCERIN (NITROSTAT) 0.4 MG SL tablet Place 1 tablet under tongue every 5 minutes as needed for chest pain. (No more than 3 doses within 15 minutes) 03/08/19   Flinchum, Eula FriedMichelle S, FNP  sacubitril-valsartan (ENTRESTO) 24-26 MG Take 1 tablet by mouth 2 (two) times daily. 09/12/20 12/11/20  Lorin Glassahal, Binaya, MD  spironolactone (ALDACTONE) 25 MG tablet Take 0.5 tablets (12.5 mg total) by mouth daily. 09/13/20 12/12/20  Lorin Glassahal, Binaya, MD  SUMAtriptan (IMITREX) 50 MG tablet Take 1 tablet (50 mg total) by mouth every 2 (two) hours as needed for up to 7 days for migraine. 09/12/20 09/19/20  Lorin Glassahal, Binaya, MD  torsemide (DEMADEX) 20 MG tablet Take 1 tablet (20 mg total) by mouth daily. 09/13/20 12/12/20  Lorin Glassahal, Binaya, MD  traZODone (DESYREL) 100 MG tablet Take 1 tablet (100 mg total) by mouth at bedtime for 7 days. 09/12/20 09/19/20  Lorin Glassahal, Binaya, MD  warfarin (COUMADIN) 5 MG tablet Take 1 tablet (5 mg total) by mouth daily. 09/12/20 12/11/20  Lorin Glassahal, Binaya, MD    Inpatient Medications:   amiodarone  150 mg Intravenous Once   furosemide  60 mg Intravenous Q12H    amiodarone     Followed by   amiodarone     doxycycline (VIBRAMYCIN) IV 100 mg (09/23/20 1324)    Allergies: No Known Allergies  Social History   Socioeconomic History   Marital status:  Single    Spouse name: Not on file   Number of children: Not on file   Years of education: Not on file   Highest education level: Not on file  Occupational History   Not on file  Tobacco Use   Smoking status: Never   Smokeless tobacco: Never  Vaping Use   Vaping Use: Never used  Substance and Sexual Activity   Alcohol use: Not Currently   Drug use: Never   Sexual activity: Not Currently  Other Topics Concern   Not on file  Social History Narrative   Not on file   Social Determinants of Health   Financial Resource Strain: Not on file  Food Insecurity: Not on file  Transportation Needs: Unmet Transportation Needs   Lack of Transportation (Medical): Yes   Lack of Transportation (Non-Medical):  Yes  Physical Activity: Not on file  Stress: Not on file  Social Connections: Not on file  Intimate Partner Violence: Not on file     Family History  Problem Relation Age of Onset   Heart failure Mother    Lung cancer Mother    Lung cancer Father    Heart attack Maternal Grandmother    Heart attack Maternal Grandfather      Review of Systems: A 12-system review of systems was performed and is negative except as noted in the HPI.  Labs: No results for input(s): CKTOTAL, CKMB, TROPONINI in the last 72 hours. Lab Results  Component Value Date   WBC 8.6 09/23/2020   HGB 11.7 (L) 09/23/2020   HCT 35.4 (L) 09/23/2020   MCV 91.7 09/23/2020   PLT 238 09/23/2020    Recent Labs  Lab 09/23/20 0755  NA 137  K 4.0  CL 95*  CO2 27  BUN 15  CREATININE 0.58*  CALCIUM 7.9*  PROT 6.2*  BILITOT 0.8  ALKPHOS 87  ALT 16  AST 25  GLUCOSE 146*   Lab Results  Component Value Date   CHOL 168 08/31/2020   HDL 75 08/31/2020   LDLCALC 76 08/31/2020   TRIG 83 08/31/2020   Lab Results  Component Value Date   DDIMER 2.17 (H) 09/23/2020    Radiology/Studies:  DG Chest 1 View  Result Date: 09/07/2020 CLINICAL DATA:  Chest pain. EXAM: CHEST  1 VIEW COMPARISON:  August 30, 2020  FINDINGS: Stable cardiomegaly. No pneumothorax. No nodules or masses. No focal infiltrates. No overt edema. IMPRESSION: No active disease. Electronically Signed   By: Gerome Sam III M.D   On: 09/07/2020 12:01   CT CHEST W CONTRAST  Result Date: 08/31/2020 CLINICAL DATA:  60 year old male with clinical suspicion for pulmonary embolism. EXAM: CT CHEST WITH CONTRAST TECHNIQUE: Multidetector CT imaging of the chest was performed during intravenous contrast administration. CONTRAST:  OMNIPAQUE IOHEXOL 350 MG/ML SOLN COMPARISON:  Chest CT 06/16/2020. FINDINGS: Cardiovascular: Suboptimal contrast bolus. With these limitations in mind, there are no central, lobar or segmental sized filling defect to indicate clinically relevant pulmonary embolism. Smaller subsegmental sized pulmonary emboli cannot be entirely excluded. Heart size is enlarged with left ventricular and left atrial dilatation. There is no significant pericardial fluid, thickening or pericardial calcification. There is aortic atherosclerosis, as well as atherosclerosis of the great vessels of the mediastinum and the coronary arteries, including calcified atherosclerotic plaque in the left main, left anterior descending, left circumflex and right coronary arteries. Calcifications of the aortic valve. Mediastinum/Nodes: No pathologically enlarged mediastinal or hilar lymph nodes. Esophagus is unremarkable in appearance. No axillary lymphadenopathy. Lungs/Pleura: Scarring in the periphery of the left lower lobe, similar to the prior study. No acute consolidative airspace disease. No pleural effusions. No suspicious appearing pulmonary nodules or masses are noted. Upper Abdomen: Aortic atherosclerosis. Severe diffuse low attenuation throughout the visualized hepatic parenchyma, indicative of severe hepatic steatosis. Musculoskeletal: There are no aggressive appearing lytic or blastic lesions noted in the visualized portions of the skeleton.  IMPRESSION: 1. Limited study demonstrating no evidence of clinically relevant central, lobar or segmental sized pulmonary embolism. 2. No acute findings in the thorax to account for the patient's symptoms. 3. Aortic atherosclerosis, in addition to left main and 3 vessel coronary artery disease. Please note that although the presence of coronary artery calcium documents the presence of coronary artery disease, the severity of this disease and any potential stenosis cannot be assessed  on this non-gated CT examination. Assessment for potential risk factor modification, dietary therapy or pharmacologic therapy may be warranted, if clinically indicated. 4. There are calcifications of the aortic valve. Echocardiographic correlation for evaluation of potential valvular dysfunction may be warranted if clinically indicated. 5. Severe hepatic steatosis. Aortic Atherosclerosis (ICD10-I70.0). Electronically Signed   By: Trudie Reed M.D.   On: 08/31/2020 12:20   CT Angio Chest PE W and/or Wo Contrast  Result Date: 09/23/2020 CLINICAL DATA:  60 year old male with shortness of breath, history of recent COVID infection. EXAM: CT ANGIOGRAPHY CHEST WITH CONTRAST TECHNIQUE: Multidetector CT imaging of the chest was performed using the standard protocol during bolus administration of intravenous contrast. Multiplanar CT image reconstructions and MIPs were obtained to evaluate the vascular anatomy. CONTRAST:  Seventy-five mL Omnipaque 350, intravenous COMPARISON:  08/31/2020 FINDINGS: Cardiovascular: Satisfactory opacification of the pulmonary arteries to the segmental level. No evidence of pulmonary embolism. Unchanged moderate global cardiomegaly. Moderate coronary atherosclerotic calcifications. No pericardial effusion. Mild scattered atherosclerotic calcifications of a normal caliber thoracic aorta. Mediastinum/Nodes: No enlarged mediastinal, hilar, or axillary lymph nodes. Thyroid gland, trachea, and esophagus demonstrate  no significant findings. Lungs/Pleura: Mild bibasilar subsegmental atelectasis. No consolidative process is. No significant pleural effusion or evidence of pneumothorax. Upper Abdomen: Similar appearing severe hepatic steatosis. The remaining visualized upper abdomen is within normal limits. Musculoskeletal: Similar appearing multilevel degenerative changes of the thoracic spine, most pronounced at T8-T9. No acute osseous abnormality or aggressive appearing osseous lesion. Review of the MIP images confirms the above findings. IMPRESSION: Vascular: 1. No evidence of pulmonary embolism. 2. Unchanged moderate global cardiomegaly. 3.  Coronary and aortic atherosclerosis (ICD10-I70.0). Non-Vascular: 1. Mild bibasilar subsegmental atelectasis. 2. Severe hepatic steatosis. Marliss Coots, MD [facility] Radiology Electronically Signed   By: Marliss Coots MD   On: 09/23/2020 11:10   CT ABDOMEN PELVIS W CONTRAST  Result Date: 08/30/2020 CLINICAL DATA:  Abdominal pain EXAM: CT ABDOMEN AND PELVIS WITH CONTRAST TECHNIQUE: Multidetector CT imaging of the abdomen and pelvis was performed using the standard protocol following bolus administration of intravenous contrast. CONTRAST:  OMNIPAQUE IOHEXOL 300 MG/ML  SOLN COMPARISON:  CT 06/16/2020 FINDINGS: Lower chest: No pleural or pericardial effusion. Visualized lung bases clear. Hepatobiliary: Fatty liver without discrete lesion or biliary ductal dilatation. No calcified stones in the gallbladder. Pancreas: Unremarkable. No pancreatic ductal dilatation or surrounding inflammatory changes. Spleen: Normal in size without focal abnormality. Adrenals/Urinary Tract: Adrenal glands unremarkable. No renal mass or hydronephrosis. Limited renal excretion on delayed scans. The urinary bladder is incompletely distended. Stomach/Bowel: Stomach is physiologically distended by ingested fluid. The small bowel is nondilated. Normal appendix.  The colon is nondilated with scattered diverticula, no focal adjacent inflammatory change. Vascular/Lymphatic: Scattered aortoiliac calcified plaque without aneurysm. Portal vein patent. Single 1.1 cm enlarged left common iliac chain lymph node, stable. Prominent bilateral inguinal lymph nodes as before. Reproductive: Normal-sized prostate with central coarse calcifications. Other: Bilateral pelvic phleboliths.  No ascites.  No free air. Musculoskeletal: Obesity. Multilevel spondylitic changes in the lower thoracic and lumbar spine. Advanced right hip DJD. No fracture or worrisome bone lesion. IMPRESSION: 1. No acute findings. 2. Scattered colonic diverticula. 3. Fatty liver. 4.  Aortic Atherosclerosis (ICD10-170.0). Electronically Signed   By: Corlis Leak M.D.   On: 08/30/2020 11:47   US Venous Img Lower Bilateral (DVT)  Result Date: 08/31/2020 CLINICAL DATA:  Edema, pain, history of PE EXAM: BILATERAL LOWER EXTREMITY VENOUS DOPPLER ULTRASOUND TECHNIQUE: Gray-scale sonography with compression,  as well as color and duplex ultrasound, were performed to evaluate the deep venous system(s) from the level of the common femoral vein through the popliteal and proximal calf veins. COMPARISON:  01/08/2020 and previous FINDINGS: VENOUS Normal compressibility of the common femoral, superficial femoral, and popliteal veins, as well as the visualized calf veins. Visualized portions of profunda femoral vein and great saphenous vein unremarkable. No filling defects to suggest DVT on grayscale or color Doppler imaging. Doppler waveforms show normal direction of venous flow, normal respiratory phasicity and response to augmentation. OTHER Right calf edema.  Left calf edema. Limitations: Technologist describes technically difficult study secondary to body habitus BMI 61. IMPRESSION: 1. Negative Electronically Signed   By: Corlis Leak M.D.   On: 08/31/2020 14:19   DG Chest Portable 1 View  Result Date: 09/23/2020 CLINICAL DATA:   Chest pain, shortness of breath. EXAM: PORTABLE CHEST 1 VIEW COMPARISON:  September 07, 2020. FINDINGS: Stable cardiomegaly with central pulmonary vascular congestion. No consolidative process is noted. The visualized skeletal structures are unremarkable. IMPRESSION: Stable cardiomegaly with central pulmonary vascular congestion. Electronically Signed   By: Lupita Raider M.D.   On: 09/23/2020 08:42   DG Chest Portable 1 View  Result Date: 08/30/2020 CLINICAL DATA:  Per Triage: Pt arrives via EMS from home after having increased SHOB- per EMS pt was 95% on his chronic 3L [place]- pt was also vomiting on the ems stretcher Patient alert and oriented. Patient able to move with help. Patient is naus.*comment was truncated*SHOB EXAM: PORTABLE CHEST 1 VIEW COMPARISON:  08/30/2018 FINDINGS: Stable cardiomegaly. There is increase in vascular congestion pattern. Probable mild edema superimposed on the vascular congestion. No pneumothorax. No focal consolidation. IMPRESSION: Cardiomegaly and increased pulmonary edema pattern. Electronically Signed   By: Genevive Bi M.D.   On: 08/30/2020 09:19   ECHOCARDIOGRAM COMPLETE  Result Date: 08/31/2020    ECHOCARDIOGRAM REPORT   Patient Name:   TOMAZ JANIS Schellinger Date of Exam: 08/30/2020 Medical Rec #:  [id number]              Height:       68.0 in Accession #:    [id number]             Weight:       350.0 lb Date of Birth:  March 30, 1960              BSA:          2.592 m Patient Age:    59 years               BP:           127/92 mmHg Patient Gender: M                      HR:           115 bpm. Exam Location:  [facility] Procedure: 2D Echo and Intracardiac Opacification Agent Indications:     CHF I50.21  History:         Patient has prior history of Echocardiogram examinations, most                  recent 04/07/2020.  Sonographer:     Overton Mam RDCS Referring Phys:  [id number] Brien Few NIU Diagnosing Phys: Chilton Si MD  Sonographer Comments: Technically challenging study due to  limited acoustic windows, Technically difficult study due to poor echo windows, no apical window, no parasternal window, no subcostal window and patient  is morbidly obese. Image acquisition challenging due to patient body habitus. IMPRESSIONS  1. Left ventricular ejection fraction, by estimation, is unable to assess%. The left ventricle has severely decreased function. The left ventricle has no regional wall motion abnormalities. The left ventricular internal cavity size was moderately dilated. There is unable to assess left ventricular hypertrophy. Left ventricular diastolic function could not be evaluated.  2. Right ventricular systolic function is severely reduced. The right ventricular size is not well visualized.  3. The mitral valve was not well visualized. unable to assess mitral valve regurgitation. unable to assess mitral stenosis.  4. The aortic valve was not well visualized. Aortic valve regurgitation unable to assess. unable to assess.  5. Pulmonic valve regurgitation unable to assess. unable to assess pulmonic stenosis. FINDINGS  Left Ventricle: Left ventricular ejection fraction, by estimation, is unable to assess%. The left ventricle has severely decreased function. The left ventricle has no regional wall motion abnormalities. Definity contrast agent was given IV to delineate the left ventricular endocardial borders. The left ventricular internal cavity size was moderately dilated. There is unable to assess left ventricular hypertrophy. Left ventricular diastolic function could not be evaluated. Right Ventricle: The right ventricular size is not well visualized. No increase in right ventricular wall thickness. Right ventricular systolic function is severely reduced. Left Atrium: Left atrial size was not well visualized. Right Atrium: Right atrial size was not well visualized. Pericardium: The pericardium was not well visualized. Mitral Valve: The mitral valve was not well visualized. Unable to assess  mitral valve regurgitation. Unable to assess mitral valve stenosis. Tricuspid Valve: The tricuspid valve is not well visualized. Tricuspid valve regurgitation is trivial. No evidence of tricuspid stenosis. Aortic Valve: The aortic valve was not well visualized. Aortic valve regurgitation unable to assess. Unable to assess. Aortic valve peak gradient measures 5.6 mmHg. Pulmonic Valve: The pulmonic valve was not well visualized. Pulmonic valve regurgitation unable to assess. Unable to assess pulmonic stenosis. Aorta: Aortic root could not be assessed. Venous: The pulmonary veins were not well visualized. The inferior vena cava was not well visualized. IAS/Shunts: The interatrial septum was not well visualized. Additional Comments: Image quality is very poor. Inadequate visualization of the valves and endocardium despite the use of Defnity conrast. Both right and left ventricular systolic function appears to be significantly imparied. Visualization is not adequate to quantify systolic function. Recommend cardiac MRI or TEE for better visualization if indicated.  AORTIC VALVE AV Vmax:      118.00 cm/s AV Peak Grad: 5.6 mmHg MITRAL VALVE               TRICUSPID VALVE MV Area (PHT): 3.81 cm    TR Peak grad:   12.8 mmHg MV Decel Time: 199 msec    TR Vmax:        179.00 cm/s MV E velocity: 86.10 cm/s Chilton Si MD Electronically signed by Chilton Si MD Signature Date/Time: 08/31/2020/12:49:59 PM    Final     Wt Readings from Last 3 Encounters:  09/23/20 (!) 174.6 kg  09/13/20 (!) 175 kg  09/04/20 (!) 176.8 kg    EKG: Atrial fibrillation with rapid ventricular response  Physical Exam:  Blood pressure (!) 140/93, pulse (!) 131, temperature 99.5 F (37.5 C), temperature source Oral, resp. rate (!) 25, height 5\' 8"  (1.727 m), weight (!) 174.6 kg, SpO2 98 %. Body mass index is 58.54 kg/m. General: Morbidly obese male breathing with BiPAP Head: Normocephalic, atraumatic, sclera non-icteric, no  xanthomas, nares  are without discharge.  Neck: Negative for carotid bruits. JVD not elevated. Lungs: Decreased breath sounds bilaterally with bilateral wheezes and rales.  Heart: irr, irr Abdomen: Soft, non-tender, non-distended with normoactive bowel sounds. No hepatomegaly. No rebound/guarding. No obvious abdominal masses. Msk:  Strength and tone appear normal for age. Extremities: No clubbing or cyanosis. No edema.  Distal pedal pulses are 2+ and equal bilaterally. Neuro: Poor historian     Assessment and Plan  60 y.o. male with history of morbid obesity (BMI is 58.54 kg/m) obstructive sleep apnea, COPD, HFR EF with an ejection fraction of 30 to 35% by echo in February 2022, chronic respiratory failure on 4 L of oxygen at home, on warfarin for venous thromboembolism, lymphedema, hypothyroidism and ongoing alcohol abuse with medical noncompliance.  He presents emergency room with shortness of breath.  He was discharged from the hospital 10 days ago where he was admitted for similar problems.  Patient is a very difficult historian.  It is unclear whether he is compliant with his medications.  He is unaware of what he is on. CXR revealed central pulmonary vascular congestion with stable cardiomegaly. Chest ct showed no pe with moderate gloal cardiomegaly and bibasilar stenosis. EKG showed afib with rapid vr. Troponins flat and do not appear to represent acute ischemia. He complaiins of sob but no chest pain  1 Afib -Rate is poorly controlled. WIll give another bolus of iv amiodarone and contiinue with the drip. Will consider adding beta blockers as pressure tolerates and rate control varies. Continue with warfarin with an inr goal of 2-3  2. HFrEF-echo showed ef of 25-30%. Echo at previous echo done 08/30/20 was not able to have ef read due to poor windows. Is tolerating entresto, spironolactone and lasix thus far. Will follow electrolytes hemodynamics, and renal function. .Will follow BNP. Was 442 3  weeks ago and 160 2 weeks ago and 133 today.   3. Lymphedema-chronic  Signed, Dalia Heading MD 09/23/2020, 2:10 PM Pager: [phone]

## 2020-09-23 NOTE — ED Notes (Signed)
Danford MD at bedside assessing pt at this time.

## 2020-09-23 NOTE — ED Notes (Signed)
Pt moved for er bed to [facility] hospital bed, pt found to be wet and sitting in urine, pt has been using the urinal by self; however, isn't aware when he is done urinating.  Pt has redness to lower abd and large excoriation to his buttocks and upper legs, dressings placed, md notified, number [id number] foley cath placed, pt has significant swelling and edema to foreskin, aprox 300 ml of clear urine out.  Pt tolerated well.

## 2020-09-23 NOTE — ED Notes (Signed)
Portable Xray at bedside.

## 2020-09-23 NOTE — Consult Note (Signed)
ANTICOAGULATION CONSULT NOTE  Pharmacy Consult for Warfarin Indication:  Afib / Hx VTE  Patient Measurements: Height: 5\' 8"  (172.7 cm) Weight: (!) 174.6 kg (385 lb) IBW/kg (Calculated) : 68.4  Labs: Recent Labs    09/23/20 0755 09/23/20 0804 09/23/20 1109  HGB 11.7*  --   --   HCT 35.4*  --   --   PLT 238  --   --   LABPROT  --  15.1  --   INR  --  1.2  --   CREATININE 0.58*  --   --   TROPONINIHS 37*  --  38*    Estimated Creatinine Clearance: 154 mL/min (A) (by C-G formula based on SCr of 0.58 mg/dL (L)).   Medical History: Past Medical History:  Diagnosis Date   Allergy    Anxiety    Arthritis    Asthma    Brain damage    Chronic pain of both knees 07/13/2018   Clotting disorder (HCC)    COPD (chronic obstructive pulmonary disease) (HCC)    Depression    GERD (gastroesophageal reflux disease)    HFrEF (heart failure with reduced ejection fraction) (HCC)    a. 03/2018 Echo: EF 25-30%, diff HK. Mod LAE.   History of DVT (deep vein thrombosis)    History of pulmonary embolism    a. Chronic coumadin.   Hypertension    MI (myocardial infarction) (HCC)    Morbid obesity (HCC)    Neck pain 07/13/2018   NICM (nonischemic cardiomyopathy) (HCC)    a. s/p Cath x 3 - reportedly nl cors. Last cath 2019 in [place]; b. a. 03/2018 Echo: EF 25-30%, diff HK.   Persistent atrial fibrillation (HCC)    a. 03/2018 s/p DCCV; b. CHA2DS2VASc = 1-->Xarelto (later changed to warfarin); c. 05/2018 recurrent afib-->Amio initiated.   Sleep apnea    Sleep apnea     Medications:  Warfarin 5 mg daily prior to admission (medication reconciliation pending)  Assessment: Patient is a 60 y/o M with medical history as above and including Afib and history of VTE on warfarin who presented to the ED 8/9 with SOB / diarrhea / feeling unwell. Pharmacy consulted to assist in management and monitoring of warfarin while patient is admitted.  H&H with mild anemia (c/w BL), platelets within normal  limits.  LFTs: Within normal limits. Albumin 2.8 DDI: Doxycycline, amiodarone  Date INR Plan  8/9 1.2 5 mg    Goal of Therapy:  INR 2-3  Plan:  --INR is subtherapeutic --Will give warfarin 5 mg tonight (home dose) in view of drug-drug interactions --Daily INR per protocol --CBC at least every 3 days  10/9 09/23/2020,1:16 PM

## 2020-09-23 NOTE — ED Notes (Signed)
RT called for CPAP
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
## 2020-09-23 NOTE — ED Notes (Signed)
MD notified of lactic acid 4.2.

## 2020-09-23 NOTE — ED Notes (Signed)
Pt requesting CPAP, something for nausea, and something for heartburn.

## 2020-09-23 NOTE — ED Notes (Signed)
Pt laying in bed. Far down in the bed. PT requesting to be pulled up. PT pulled up with several assist then patient decided he wanted to sit on the side of the bed. PT assisted to sit at the side of the bed. Pt only wanted to sit there for just a few minutes then lay back in the bed.

## 2020-09-23 NOTE — Progress Notes (Signed)
*  PRELIMINARY RESULTS* Echocardiogram 2D Echocardiogram has been performed.  Cristela Blue 09/23/2020, 2:29 PM

## 2020-09-24 DIAGNOSIS — I4891 Unspecified atrial fibrillation: Secondary | ICD-10-CM

## 2020-09-24 DIAGNOSIS — I509 Heart failure, unspecified: Secondary | ICD-10-CM | POA: Diagnosis not present

## 2020-09-24 DIAGNOSIS — J432 Centrilobular emphysema: Secondary | ICD-10-CM

## 2020-09-24 LAB — BASIC METABOLIC PANEL
Anion gap: 10 (ref 5–15)
BUN: 9 mg/dL (ref 6–20)
CO2: 35 mmol/L — ABNORMAL HIGH (ref 22–32)
Calcium: 8 mg/dL — ABNORMAL LOW (ref 8.9–10.3)
Chloride: 90 mmol/L — ABNORMAL LOW (ref 98–111)
Creatinine, Ser: 0.71 mg/dL (ref 0.61–1.24)
GFR, Estimated: >60 mL/min (ref >60)
Glucose, Bld: 162 mg/dL — ABNORMAL HIGH (ref 70–99)
Potassium: 3.4 mmol/L — ABNORMAL LOW (ref 3.5–5.1)
Sodium: 135 mmol/L (ref 135–145)

## 2020-09-24 LAB — CBC
HCT: 32.6 % — ABNORMAL LOW (ref 39.0–52.0)
Hemoglobin: 10.9 g/dL — ABNORMAL LOW (ref 13.0–17.0)
MCH: 31.3 pg (ref 26.0–34.0)
MCHC: 33.4 g/dL (ref 30.0–36.0)
MCV: 93.7 fL (ref 80.0–100.0)
Platelets: 133 10*3/uL — ABNORMAL LOW (ref 150–400)
RBC: 3.48 MIL/uL — ABNORMAL LOW (ref 4.22–5.81)
RDW: 16.1 % — ABNORMAL HIGH (ref 11.5–15.5)
WBC: 4.8 10*3/uL (ref 4.0–10.5)
nRBC: 0 % (ref 0.0–0.2)

## 2020-09-24 LAB — LACTIC ACID, PLASMA: Lactic Acid, Venous: 1.4 mmol/L (ref 0.5–1.9)

## 2020-09-24 LAB — GLUCOSE, CAPILLARY
Glucose-Capillary: 151 mg/dL — ABNORMAL HIGH (ref 70–99)
Glucose-Capillary: 134 mg/dL — ABNORMAL HIGH (ref 70–99)

## 2020-09-24 LAB — CBG MONITORING, ED
Glucose-Capillary: 155 mg/dL — ABNORMAL HIGH (ref 70–99)
Glucose-Capillary: 139 mg/dL — ABNORMAL HIGH (ref 70–99)
Glucose-Capillary: 143 mg/dL — ABNORMAL HIGH (ref 70–99)

## 2020-09-24 LAB — C DIFFICILE QUICK SCREEN W PCR REFLEX
C Diff antigen: NEGATIVE
C Diff interpretation: NOT DETECTED
C Diff toxin: NEGATIVE

## 2020-09-24 LAB — MRSA NEXT GEN BY PCR, NASAL: MRSA by PCR Next Gen: DETECTED — AB

## 2020-09-24 LAB — PROTIME-INR
INR: 1.2 (ref 0.8–1.2)
Prothrombin Time: 14.8 seconds (ref 11.4–15.2)

## 2020-09-24 MED ORDER — TIOTROPIUM BROMIDE MONOHYDRATE 18 MCG IN CAPS
18.0000 ug | ORAL_CAPSULE | Freq: Every day | RESPIRATORY_TRACT | Status: DC
  Administered 2020-09-24 – 2020-09-27 (×4): 18 ug via RESPIRATORY_TRACT
  Filled 2020-09-24 (×2): qty 5

## 2020-09-24 MED ORDER — METOPROLOL TARTRATE 50 MG PO TABS
50.0000 mg | ORAL_TABLET | Freq: Two times a day (BID) | ORAL | Status: DC
  Administered 2020-09-24 – 2020-09-27 (×5): 50 mg via ORAL
  Filled 2020-09-24 (×6): qty 1

## 2020-09-24 MED ORDER — METOPROLOL TARTRATE 25 MG PO TABS
25.0000 mg | ORAL_TABLET | Freq: Two times a day (BID) | ORAL | Status: DC
  Administered 2020-09-24: 25 mg via ORAL
  Filled 2020-09-24: qty 1

## 2020-09-24 MED ORDER — COLLAGENASE 250 UNIT/GM EX OINT
TOPICAL_OINTMENT | Freq: Every day | CUTANEOUS | Status: DC
  Filled 2020-09-24 (×2): qty 30

## 2020-09-24 MED ORDER — POTASSIUM CHLORIDE CRYS ER 20 MEQ PO TBCR
40.0000 meq | EXTENDED_RELEASE_TABLET | Freq: Two times a day (BID) | ORAL | Status: AC
  Administered 2020-09-24 – 2020-09-25 (×2): 40 meq via ORAL
  Filled 2020-09-24 (×2): qty 2

## 2020-09-24 MED ORDER — DIGOXIN 0.25 MG/ML IJ SOLN
0.2500 mg | Freq: Once | INTRAMUSCULAR | Status: AC
  Administered 2020-09-24: 0.25 mg via INTRAVENOUS
  Filled 2020-09-24 (×2): qty 2

## 2020-09-24 MED ORDER — POTASSIUM CHLORIDE 10 MEQ/100ML IV SOLN: 10.0000 meq | INTRAVENOUS | Status: DC

## 2020-09-24 MED ORDER — WARFARIN SODIUM 7.5 MG PO TABS
7.5000 mg | ORAL_TABLET | Freq: Once | ORAL | Status: AC
  Administered 2020-09-24: 7.5 mg via ORAL
  Filled 2020-09-24: qty 1

## 2020-09-24 MED ORDER — BUDESONIDE 0.25 MG/2ML IN SUSP
0.2500 mg | Freq: Two times a day (BID) | RESPIRATORY_TRACT | Status: DC
  Administered 2020-09-24 – 2020-09-27 (×7): 0.25 mg via RESPIRATORY_TRACT
  Filled 2020-09-24 (×7): qty 2

## 2020-09-24 MED ORDER — TRAZODONE HCL 100 MG PO TABS
100.0000 mg | ORAL_TABLET | Freq: Every day | ORAL | Status: DC
  Administered 2020-09-24 – 2020-09-26 (×3): 100 mg via ORAL
  Filled 2020-09-24 (×3): qty 1

## 2020-09-24 MED ORDER — OXYCODONE-ACETAMINOPHEN 7.5-325 MG PO TABS
1.0000 | ORAL_TABLET | Freq: Four times a day (QID) | ORAL | Status: DC | PRN
  Administered 2020-09-24 – 2020-09-25 (×3): 1 via ORAL
  Filled 2020-09-24 (×4): qty 1

## 2020-09-24 MED ORDER — CHLORHEXIDINE GLUCONATE CLOTH 2 % EX PADS
6.0000 | MEDICATED_PAD | Freq: Every day | CUTANEOUS | Status: DC
  Administered 2020-09-24 – 2020-09-25 (×2): 6 via TOPICAL

## 2020-09-24 NOTE — ED Notes (Signed)
Incontinence care provided for patient. Stool sample collected.   Patient placed on bedpan.

## 2020-09-24 NOTE — ED Notes (Signed)
Responded to call light. Patient requesting lid for cup.

## 2020-09-24 NOTE — ED Notes (Signed)
Called pharmacy to ask for medication reconciliation of patient's home medications.

## 2020-09-24 NOTE — Consult Note (Addendum)
WOC Nurse Consult Note: Reason for Consult: Consult requested for buttocks and thighs.  Pt is familiar to [facility] team from multiple previous admissions.  Wound type: Right outer thigh with full thickness wound which occurred during a fall prior to admission, according to the patient.  100% slough, small amt tan drainage, no odor, 10X1cm Posterior lower thighs/behind knees with red moist macerated partial thickness skin loss Right 8X8X.1cm, red and moist Left 6X6X.1cm, red and moist Bilat buttocks and sacrum assessed; I did not visualize any pressure injuries Abd and inner goin skin folds are red macerated, moist and painful; appearance is consistent with intertrigo and probable candidiasis.   Dressing procedure/placement/frequency: Topical treatment orders provided for bedside nurses to perform as follows to promote healing: 1. Apply Santyl ointment to right thigh wound Q day, then cover with moist gauze and foam dressings. (Change foam dressings Q 3 days or PRN soiling.) 2. Foam dressings to bilat posterior knees/thighs, change Q 3 days or PRN soiling.  3. Measure and cut length of InterDry to fit in skin folds that have skin breakdown  Tuck InterDry fabric into skin folds in a single layer, allow for 2 inches of overhang from skin edges to allow for wicking to occur May remove to bathe; dry area thoroughly and then tuck into affected areas again  Do not apply any creams or ointments when using InterDry DO NOT THROW AWAY FOR 5 DAYS unless soiled with stool DO NOT [facility] product, this will inactivate the silver in the material  New sheet of Interdry should be applied after 5 days of use if patient continues to have skin breakdown. Discontinue use of current sheet after 8/15  Please re-consult if further assistance is needed.  Thank-you,  Cammie Mcgee MSN, RN, CWOCN, [place], CNS [phone]

## 2020-09-24 NOTE — Progress Notes (Signed)
Patient Name: [name] [name] Date of Encounter: 09/24/2020  Hospital Problem List     Principal Problem:   Acute on chronic combined systolic and diastolic CHF (congestive heart failure) (HCC) Active Problems:   Lymphedema   Chronic atrial fibrillation (HCC)   Type 2 diabetes mellitus with other specified complication (HCC)   HTN (hypertension)   Morbid obesity with BMI of 60.0-69.9, adult (HCC)   Personal history of DVT (deep vein thrombosis)   Atrial fibrillation with rapid ventricular response (HCC)   Major depressive disorder, recurrent episode, in partial remission (HCC)   Noncompliance   Hypothyroidism   Chronic anticoagulation (warfarin  COUMADIN)   COPD (chronic obstructive pulmonary disease) (HCC)   Chronic respiratory failure (HCC)    Patient Profile     60 y.o. male with history of morbid obesity (BMI is 58.54 kg/m) obstructive sleep apnea, COPD, HFR EF with an ejection fraction of 30 to 35% by echo in February 2022, chronic respiratory failure on 4 L of oxygen at home, on warfarin for venous thromboembolism, lymphedema, hypothyroidism and ongoing alcohol abuse with medical noncompliance.  He presents emergency room with shortness of breath.  He was discharged from the hospital 10 days ago where he was admitted for similar problems.  Patient is a very difficult historian.  It is unclear whether he is compliant with his medications.  He is unaware of what he is on. CXR revealed central pulmonary vascular congestion with stable cardiomegaly. Chest ct showed no pe with moderate gloal cardiomegaly and bibasilar stenosis. EKG showed afib with rapid vr. Troponins flat and do not appear to represent acute ischemia. He complaiins of sob but no chest pain  Subjective   Slightly better this am. Less sob.   Inpatient Medications     allopurinol  100 mg Oral Daily   atorvastatin  40 mg Oral Daily   colchicine  0.6 mg Oral Daily   collagenase   Topical Daily    DULoxetine  20 mg Oral Daily   folic acid  1 mg Oral Daily   furosemide  60 mg Intravenous Q12H   insulin aspart  0-5 Units Subcutaneous QHS   insulin aspart  0-9 Units Subcutaneous TID WC   levothyroxine  75 mcg Oral QAC breakfast   metoprolol tartrate  25 mg Oral BID   montelukast  10 mg Oral QHS   multivitamin with minerals  1 tablet Oral Daily   potassium chloride  10 mEq Oral BID   sacubitril-valsartan  1 tablet Oral BID   spironolactone  12.5 mg Oral Daily   thiamine  100 mg Oral Daily   Or   thiamine  100 mg Intravenous Daily   warfarin  7.5 mg Oral ONCE-1600   Warfarin - Pharmacist Dosing Inpatient   Does not apply q1600    Vital Signs    Vitals:   09/24/20 0400 09/24/20 0500 09/24/20 0807 09/24/20 1138  BP: (!) 113/91 (!) 120/97 (!) 136/117 (!) 142/112  Pulse: (!) 119 (!) 58 (!) 131 (!) 118  Resp: (!) 22 (!) 25 (!) 22 (!) 22  Temp:   97.8 F (36.6 C) 97.9 F (36.6 C)  TempSrc:   Oral Oral  SpO2: 98% 100% 97% 98%  Weight:      Height:        Intake/Output Summary (Last 24 hours) at 09/24/2020 1140 Last data filed at 09/24/2020 0400 Gross per 24 hour  Intake 658.57 ml  Output 4250 ml  Net -3591.43 ml  Filed Weights   09/23/20 0752  Weight: (!) 174.6 kg    Physical Exam    GEN: Chronic and acutely ill male  HEENT: normal.  Neck: Supple, no JVD, carotid bruits, or masses. Cardiac: irr, irr, with tachycardia Respiratory:  Decreased breath sounds bilaterally GI: Soft, nontender, nondistended, BS + x 4. MS: no deformity or atrophy. Skin: warm and dry, no rash. Neuro:  Strength and sensation are intact. Psych: Normal affect.  Labs    CBC Recent Labs    09/23/20 0755 09/24/20 0648  WBC 8.6 4.8  NEUTROABS 6.6  --   HGB 11.7* 10.9*  HCT 35.4* 32.6*  MCV 91.7 93.7  PLT 238 133*   Basic Metabolic Panel Recent Labs    94/32/76 0755 09/24/20 0648  NA 137 135  K 4.0 3.4*  CL 95* 90*  CO2 27 35*  GLUCOSE 146* 162*  BUN 15 9  CREATININE 0.58*  0.71  CALCIUM 7.9* 8.0*  MG 1.5*  --    Liver Function Tests Recent Labs    09/23/20 0755  AST 25  ALT 16  ALKPHOS 87  BILITOT 0.8  PROT 6.2*  ALBUMIN 2.8*   No results for input(s): LIPASE, AMYLASE in the last 72 hours. Cardiac Enzymes No results for input(s): CKTOTAL, CKMB, CKMBINDEX, TROPONINI in the last 72 hours. BNP Recent Labs    09/23/20 0755  BNP 133.4*   D-Dimer Recent Labs    09/23/20 0804  DDIMER 2.17*   Hemoglobin A1C No results for input(s): HGBA1C in the last 72 hours. Fasting Lipid Panel No results for input(s): CHOL, HDL, LDLCALC, TRIG, CHOLHDL, LDLDIRECT in the last 72 hours. Thyroid Function Tests No results for input(s): TSH, T4TOTAL, T3FREE, THYROIDAB in the last 72 hours.  Invalid input(s): FREET3  Telemetry    Afib with rvr  ECG    Afib with rvr  Radiology    DG Chest 1 View  Result Date: 09/07/2020 CLINICAL DATA:  Chest pain. EXAM: CHEST  1 VIEW COMPARISON:  August 30, 2020 FINDINGS: Stable cardiomegaly. No pneumothorax. No nodules or masses. No focal infiltrates. No overt edema. IMPRESSION: No active disease. Electronically Signed   By: Gerome Sam III M.D   On: 09/07/2020 12:01   CT CHEST W CONTRAST  Result Date: 08/31/2020 CLINICAL DATA:  60 year old male with clinical suspicion for pulmonary embolism. EXAM: CT CHEST WITH CONTRAST TECHNIQUE: Multidetector CT imaging of the chest was performed during intravenous contrast administration. CONTRAST:  OMNIPAQUE IOHEXOL 350 MG/ML SOLN COMPARISON:  Chest CT 06/16/2020. FINDINGS: Cardiovascular: Suboptimal contrast bolus. With these limitations in mind, there are no central, lobar or segmental sized filling defect to indicate clinically relevant pulmonary embolism. Smaller subsegmental sized pulmonary emboli cannot be entirely excluded. Heart size is enlarged with left ventricular and left atrial dilatation. There is no significant pericardial fluid, thickening or pericardial  calcification. There is aortic atherosclerosis, as well as atherosclerosis of the great vessels of the mediastinum and the coronary arteries, including calcified atherosclerotic plaque in the left main, left anterior descending, left circumflex and right coronary arteries. Calcifications of the aortic valve. Mediastinum/Nodes: No pathologically enlarged mediastinal or hilar lymph nodes. Esophagus is unremarkable in appearance. No axillary lymphadenopathy. Lungs/Pleura: Scarring in the periphery of the left lower lobe, similar to the prior study. No acute consolidative airspace disease. No pleural effusions. No suspicious appearing pulmonary nodules or masses are noted. Upper Abdomen: Aortic atherosclerosis. Severe diffuse low attenuation throughout the visualized hepatic parenchyma, indicative of severe hepatic steatosis.  Musculoskeletal: There are no aggressive appearing lytic or blastic lesions noted in the visualized portions of the skeleton. IMPRESSION: 1. Limited study demonstrating no evidence of clinically relevant central, lobar or segmental sized pulmonary embolism. 2. No acute findings in the thorax to account for the patient's symptoms. 3. Aortic atherosclerosis, in addition to left main and 3 vessel coronary artery disease. Please note that although the presence of coronary artery calcium documents the presence of coronary artery disease, the severity of this disease and any potential stenosis cannot be assessed on this non-gated CT examination. Assessment for potential risk factor modification, dietary therapy or pharmacologic therapy may be warranted, if clinically indicated. 4. There are calcifications of the aortic valve. Echocardiographic correlation for evaluation of potential valvular dysfunction may be warranted if clinically indicated. 5. Severe hepatic steatosis. Aortic Atherosclerosis (ICD10-I70.0). Electronically Signed   By: Trudie Reed M.D.   On: 08/31/2020 12:20   CT Angio Chest PE  W and/or Wo Contrast  Result Date: 09/23/2020 CLINICAL DATA:  60 year old male with shortness of breath, history of recent COVID infection. EXAM: CT ANGIOGRAPHY CHEST WITH CONTRAST TECHNIQUE: Multidetector CT imaging of the chest was performed using the standard protocol during bolus administration of intravenous contrast. Multiplanar CT image reconstructions and MIPs were obtained to evaluate the vascular anatomy. CONTRAST:  Seventy-five mL Omnipaque 350, intravenous COMPARISON:  08/31/2020 FINDINGS: Cardiovascular: Satisfactory opacification of the pulmonary arteries to the segmental level. No evidence of pulmonary embolism. Unchanged moderate global cardiomegaly. Moderate coronary atherosclerotic calcifications. No pericardial effusion. Mild scattered atherosclerotic calcifications of a normal caliber thoracic aorta. Mediastinum/Nodes: No enlarged mediastinal, hilar, or axillary lymph nodes. Thyroid gland, trachea, and esophagus demonstrate no significant findings. Lungs/Pleura: Mild bibasilar subsegmental atelectasis. No consolidative process is. No significant pleural effusion or evidence of pneumothorax. Upper Abdomen: Similar appearing severe hepatic steatosis. The remaining visualized upper abdomen is within normal limits. Musculoskeletal: Similar appearing multilevel degenerative changes of the thoracic spine, most pronounced at T8-T9. No acute osseous abnormality or aggressive appearing osseous lesion. Review of the MIP images confirms the above findings. IMPRESSION: Vascular: 1. No evidence of pulmonary embolism. 2. Unchanged moderate global cardiomegaly. 3.  Coronary and aortic atherosclerosis (ICD10-I70.0). Non-Vascular: 1. Mild bibasilar subsegmental atelectasis. 2. Severe hepatic steatosis. Marliss Coots, MD [facility] Radiology Electronically Signed   By: Marliss Coots MD   On: 09/23/2020 11:10   CT ABDOMEN PELVIS W CONTRAST  Result Date:  08/30/2020 CLINICAL DATA:  Abdominal pain EXAM: CT ABDOMEN AND PELVIS WITH CONTRAST TECHNIQUE: Multidetector CT imaging of the abdomen and pelvis was performed using the standard protocol following bolus administration of intravenous contrast. CONTRAST:  OMNIPAQUE IOHEXOL 300 MG/ML  SOLN COMPARISON:  CT 06/16/2020 FINDINGS: Lower chest: No pleural or pericardial effusion. Visualized lung bases clear. Hepatobiliary: Fatty liver without discrete lesion or biliary ductal dilatation. No calcified stones in the gallbladder. Pancreas: Unremarkable. No pancreatic ductal dilatation or surrounding inflammatory changes. Spleen: Normal in size without focal abnormality. Adrenals/Urinary Tract: Adrenal glands unremarkable. No renal mass or hydronephrosis. Limited renal excretion on delayed scans. The urinary bladder is incompletely distended. Stomach/Bowel: Stomach is physiologically distended by ingested fluid. The small bowel is nondilated. Normal appendix. The colon is nondilated with scattered diverticula, no focal adjacent inflammatory change. Vascular/Lymphatic: Scattered aortoiliac calcified plaque without aneurysm. Portal vein patent. Single 1.1 cm enlarged left common iliac chain lymph node, stable. Prominent bilateral inguinal lymph nodes as before. Reproductive: Normal-sized prostate with central coarse calcifications. Other: Bilateral pelvic phleboliths.  No ascites.  No free air. Musculoskeletal: Obesity. Multilevel spondylitic changes in the lower thoracic and lumbar spine. Advanced right hip DJD. No fracture or worrisome bone lesion. IMPRESSION: 1. No acute findings. 2. Scattered colonic diverticula. 3. Fatty liver. 4.  Aortic Atherosclerosis (ICD10-170.0). Electronically Signed   By: Corlis Leak  Hassell M.D.   On: 08/30/2020 11:47   US Venous Img Lower Bilateral (DVT)  Result Date: 08/31/2020 CLINICAL DATA:  Edema, pain, history of PE EXAM: BILATERAL LOWER EXTREMITY VENOUS DOPPLER ULTRASOUND TECHNIQUE:  Gray-scale sonography with compression, as well as color and duplex ultrasound, were performed to evaluate the deep venous system(s) from the level of the common femoral vein through the popliteal and proximal calf veins. COMPARISON:  01/08/2020 and previous FINDINGS: VENOUS Normal compressibility of the common femoral, superficial femoral, and popliteal veins, as well as the visualized calf veins. Visualized portions of profunda femoral vein and great saphenous vein unremarkable. No filling defects to suggest DVT on grayscale or color Doppler imaging. Doppler waveforms show normal direction of venous flow, normal respiratory phasicity and response to augmentation. OTHER Right calf edema.  Left calf edema. Limitations: Technologist describes technically difficult study secondary to body habitus BMI 61. IMPRESSION: 1. Negative Electronically Signed   By: Corlis Leak  Hassell M.D.   On: 08/31/2020 14:19   DG Chest Portable 1 View  Result Date: 09/23/2020 CLINICAL DATA:  Chest pain, shortness of breath. EXAM: PORTABLE CHEST 1 VIEW COMPARISON:  September 07, 2020. FINDINGS: Stable cardiomegaly with central pulmonary vascular congestion. No consolidative process is noted. The visualized skeletal structures are unremarkable. IMPRESSION: Stable cardiomegaly with central pulmonary vascular congestion. Electronically Signed   By: Lupita RaiderJames  Green Jr M.D.   On: 09/23/2020 08:42   DG Chest Portable 1 View  Result Date: 08/30/2020 CLINICAL DATA:  Per Triage: Pt arrives via EMS from home after having increased SHOB- per EMS pt was 95% on his chronic 3L [place]- pt was also vomiting on the ems stretcher Patient alert and oriented. Patient able to move with help. Patient is naus.*comment was truncated*SHOB EXAM: PORTABLE CHEST 1 VIEW COMPARISON:  08/30/2018 FINDINGS: Stable cardiomegaly. There is increase in vascular congestion pattern. Probable mild edema superimposed on the vascular congestion. No pneumothorax. No focal consolidation.  IMPRESSION: Cardiomegaly and increased pulmonary edema pattern. Electronically Signed   By: Genevive BiStewart  Edmunds M.D.   On: 08/30/2020 09:19   ECHOCARDIOGRAM COMPLETE  Result Date: 09/23/2020    ECHOCARDIOGRAM REPORT   Patient Name:   Maren ReamerKENNETH DWAYNE Werber Date of Exam: 09/23/2020 Medical Rec #:  [id number]              Height:       68.0 in Accession #:    [id number]             Weight:       385.0 lb Date of Birth:  1960/09/13              BSA:          2.700 m Patient Age:    60 years               BP:           140/93 mmHg Patient Gender: M                      HR:           57 bpm. Exam Location:  [facility] Procedure: 2D Echo, Cardiac Doppler and Color Doppler Indications:  CHF-acute systolic I50.21  History:         Patient has prior history of Echocardiogram examinations, most                  recent 08/30/2020. Previous Myocardial Infarction; COPD. NICM.  Sonographer:     Cristela Blue RDCS (AE) Referring Phys:  [id number] Earl Lites DANFORD Diagnosing Phys: Harold Hedge MD  Sonographer Comments: Technically difficult study due to poor echo windows, no apical window and no subcostal window. IMPRESSIONS  1. Left ventricular ejection fraction, by estimation, is 25 to 30%. The left ventricle has severely decreased function. The left ventricle demonstrates global hypokinesis. The left ventricular internal cavity size was mildly dilated. Left ventricular diastolic parameters were normal.  2. Right ventricular systolic function is normal. The right ventricular size is mildly enlarged.  3. Left atrial size was mildly dilated.  4. Right atrial size was mildly dilated.  5. The mitral valve was not well visualized. Mild mitral valve regurgitation.  6. The aortic valve was not well visualized. Aortic valve regurgitation is trivial. FINDINGS  Left Ventricle: Left ventricular ejection fraction, by estimation, is 25 to 30%. The left ventricle has severely decreased function. The left ventricle demonstrates global hypokinesis. The  left ventricular internal cavity size was mildly dilated. There is no left ventricular hypertrophy. Left ventricular diastolic parameters were normal. Right Ventricle: The right ventricular size is mildly enlarged. No increase in right ventricular wall thickness. Right ventricular systolic function is normal. Left Atrium: Left atrial size was mildly dilated. Right Atrium: Right atrial size was mildly dilated. Pericardium: There is no evidence of pericardial effusion. Mitral Valve: The mitral valve was not well visualized. Mild mitral valve regurgitation. Tricuspid Valve: The tricuspid valve is not well visualized. Tricuspid valve regurgitation is mild. Aortic Valve: The aortic valve was not well visualized. Aortic valve regurgitation is trivial. Pulmonic Valve: The pulmonic valve was not assessed. Pulmonic valve regurgitation is not visualized. Aorta: The aortic root was not well visualized. IAS/Shunts: The interatrial septum was not assessed.  LEFT VENTRICLE PLAX 2D LVIDd:         5.58 cm LVIDs:         4.88 cm LV PW:         1.34 cm LV IVS:        1.65 cm LVOT diam:     2.20 cm LVOT Area:     3.80 cm  LEFT ATRIUM         Index LA diam:    5.10 cm 1.89 cm/m                        PULMONIC VALVE AORTA                 PV Vmax:        0.80 m/s Ao Root diam: 3.50 cm PV Peak grad:   2.5 mmHg                       RVOT Peak grad: 7 mmHg  TRICUSPID VALVE TR Peak grad:   6.6 mmHg TR Vmax:        128.00 cm/s  SHUNTS Systemic Diam: 2.20 cm Harold Hedge MD Electronically signed by Harold Hedge MD Signature Date/Time: 09/23/2020/3:06:29 PM    Final    ECHOCARDIOGRAM COMPLETE  Result Date: 08/31/2020    ECHOCARDIOGRAM REPORT   Patient Name:   GEOVANI TOOTLE Mable Date of Exam: 08/30/2020 Medical  Rec #:  [id number]              Height:       68.0 in Accession #:    [id number]             Weight:       350.0 lb Date of Birth:  1960-10-31              BSA:          2.592 m Patient Age:    59 years               BP:            127/92 mmHg Patient Gender: M                      HR:           115 bpm. Exam Location:  [facility] Procedure: 2D Echo and Intracardiac Opacification Agent Indications:     CHF I50.21  History:         Patient has prior history of Echocardiogram examinations, most                  recent 04/07/2020.  Sonographer:     Overton Mam RDCS Referring Phys:  [id number] Brien Few NIU Diagnosing Phys: Chilton Si MD  Sonographer Comments: Technically challenging study due to limited acoustic windows, Technically difficult study due to poor echo windows, no apical window, no parasternal window, no subcostal window and patient is morbidly obese. Image acquisition challenging due to patient body habitus. IMPRESSIONS  1. Left ventricular ejection fraction, by estimation, is unable to assess%. The left ventricle has severely decreased function. The left ventricle has no regional wall motion abnormalities. The left ventricular internal cavity size was moderately dilated. There is unable to assess left ventricular hypertrophy. Left ventricular diastolic function could not be evaluated.  2. Right ventricular systolic function is severely reduced. The right ventricular size is not well visualized.  3. The mitral valve was not well visualized. unable to assess mitral valve regurgitation. unable to assess mitral stenosis.  4. The aortic valve was not well visualized. Aortic valve regurgitation unable to assess. unable to assess.  5. Pulmonic valve regurgitation unable to assess. unable to assess pulmonic stenosis. FINDINGS  Left Ventricle: Left ventricular ejection fraction, by estimation, is unable to assess%. The left ventricle has severely decreased function. The left ventricle has no regional wall motion abnormalities. Definity contrast agent was given IV to delineate the left ventricular endocardial borders. The left ventricular internal cavity size was moderately dilated. There is unable to assess left ventricular hypertrophy. Left  ventricular diastolic function could not be evaluated. Right Ventricle: The right ventricular size is not well visualized. No increase in right ventricular wall thickness. Right ventricular systolic function is severely reduced. Left Atrium: Left atrial size was not well visualized. Right Atrium: Right atrial size was not well visualized. Pericardium: The pericardium was not well visualized. Mitral Valve: The mitral valve was not well visualized. Unable to assess mitral valve regurgitation. Unable to assess mitral valve stenosis. Tricuspid Valve: The tricuspid valve is not well visualized. Tricuspid valve regurgitation is trivial. No evidence of tricuspid stenosis. Aortic Valve: The aortic valve was not well visualized. Aortic valve regurgitation unable to assess. Unable to assess. Aortic valve peak gradient measures 5.6 mmHg. Pulmonic Valve: The pulmonic valve was not well visualized. Pulmonic valve regurgitation unable to assess. Unable to assess pulmonic stenosis. Aorta: Aortic root could  not be assessed. Venous: The pulmonary veins were not well visualized. The inferior vena cava was not well visualized. IAS/Shunts: The interatrial septum was not well visualized. Additional Comments: Image quality is very poor. Inadequate visualization of the valves and endocardium despite the use of Defnity conrast. Both right and left ventricular systolic function appears to be significantly imparied. Visualization is not adequate to quantify systolic function. Recommend cardiac MRI or TEE for better visualization if indicated.  AORTIC VALVE AV Vmax:      118.00 cm/s AV Peak Grad: 5.6 mmHg MITRAL VALVE               TRICUSPID VALVE MV Area (PHT): 3.81 cm    TR Peak grad:   12.8 mmHg MV Decel Time: 199 msec    TR Vmax:        179.00 cm/s MV E velocity: 86.10 cm/s Chilton Si MD Electronically signed by Chilton Si MD Signature Date/Time: 08/31/2020/12:49:59 PM    Final     Assessment & Plan    60 y.o. male with  history of morbid obesity (BMI is 58.54 kg/m) obstructive sleep apnea, COPD, HFR EF with an ejection fraction of 30 to 35% by echo in February 2022, chronic respiratory failure on 4 L of oxygen at home, on warfarin for venous thromboembolism, lymphedema, hypothyroidism and ongoing alcohol abuse with medical noncompliance.  He presents emergency room with shortness of breath.  He was discharged from the hospital 10 days ago where he was admitted for similar problems.  Patient is a very difficult historian.  It is unclear whether he is compliant with his medications.  He is unaware of what he is on. CXR revealed central pulmonary vascular congestion with stable cardiomegaly. Chest ct showed no pe with moderate gloal cardiomegaly and bibasilar stenosis. EKG showed afib with rapid vr. Troponins flat and do not appear to represent acute ischemia.    1 Afib -Rate is poorly controlled. WIll continue with the amiodarone drip for now. Agree with metoprolol at current dose. At discharge, will change to toprol given ef. Continue with warfarin with an inr goal of 2-3   2. HFrEF-echo showed ef of 25-30%. Echo at previous echo done 08/30/20 was not able to have ef read due to poor windows. Is tolerating entresto, spironolactone and lasix thus far. Will follow electrolytes hemodynamics, and renal function. .Will follow BNP.    3. Lymphedema-chronic  Signed, Naszier Kaylor. Starnisha Batrez MD 09/24/2020, 11:40 AM  Pager: [phone]

## 2020-09-24 NOTE — Progress Notes (Signed)
PT Cancellation Note  Patient Details Name: [name] [name] MRN: [id number] DOB: [birth year]   Cancelled Treatment:    Reason Eval/Treat Not Completed: Patient not medically ready. PT orders received and chart reviewed. PT noted updated vitals at 8:07 a.m. (09/24/20) with HR trending 131 BPM (afib with RVR) and BP readings 136/117 mm Hg. Due to diastolic and HR, PT holding on evaluation until medically appropriate. Will re-attempt once medically stable for OOB mobility.    [name]. Fairly IV, PT, DPT Physical Therapist- [place]  [facility]  09/24/2020, 8:18 AM

## 2020-09-24 NOTE — ED Notes (Signed)
Requested pain medication from MD.  Patient complaining of bilateral hip pain.

## 2020-09-24 NOTE — ED Notes (Signed)
Patient provided milk per patient's request.  Patient now requesting nausea meds.   MD aware.

## 2020-09-24 NOTE — ED Notes (Addendum)
Stool sample collected and sent to lab.   Provided clean linens and incontinence care for patient.  Repositioned patient in bed.   Call light within reach.   Patient requested Ginger Ale and ice. Nurse brought both to the bedside. Patient upset ginger ale was diet ginger ale. RN explained to patient, in brief, the importance of managing diabetes.
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
## 2020-09-24 NOTE — ED Notes (Signed)
Pt report taken from [facility]. Pt lying in bed watching TV with no questions or concerns at this time. Pt vitals stable.

## 2020-09-24 NOTE — ED Notes (Signed)
Messaged Dr. Para March as an FYI that pt continues on his amiodarone gtt but his afib is still causing his rate to get as high as 130's. No new orders given at this time. Will continue to monitor the patient.

## 2020-09-24 NOTE — Consult Note (Signed)
ANTICOAGULATION CONSULT NOTE  Pharmacy Consult for Warfarin Indication:  Afib / Hx VTE  Patient Measurements: Height: 5\' 8"  (172.7 cm) Weight: (!) 174.6 kg (385 lb) IBW/kg (Calculated) : 68.4  Labs: Recent Labs    09/23/20 0755 09/23/20 0804 09/23/20 1109 09/24/20 0648  HGB 11.7*  --   --  10.9*  HCT 35.4*  --   --  32.6*  PLT 238  --   --  133*  LABPROT  --  15.1  --   --   INR  --  1.2  --   --   CREATININE 0.58*  --   --  0.71  TROPONINIHS 37*  --  38*  --      Estimated Creatinine Clearance: 154 mL/min (by C-G formula based on SCr of 0.71 mg/dL).   Medical History: Past Medical History:  Diagnosis Date   Allergy    Anxiety    Arthritis    Asthma    Brain damage    Chronic pain of both knees 07/13/2018   Clotting disorder (HCC)    COPD (chronic obstructive pulmonary disease) (HCC)    Depression    GERD (gastroesophageal reflux disease)    HFrEF (heart failure with reduced ejection fraction) (HCC)    a. 03/2018 Echo: EF 25-30%, diff HK. Mod LAE.   History of DVT (deep vein thrombosis)    History of pulmonary embolism    a. Chronic coumadin.   Hypertension    MI (myocardial infarction) (HCC)    Morbid obesity (HCC)    Neck pain 07/13/2018   NICM (nonischemic cardiomyopathy) (HCC)    a. s/p Cath x 3 - reportedly nl cors. Last cath 2019 in [place]; b. a. 03/2018 Echo: EF 25-30%, diff HK.   Persistent atrial fibrillation (HCC)    a. 03/2018 s/p DCCV; b. CHA2DS2VASc = 1-->Xarelto (later changed to warfarin); c. 05/2018 recurrent afib-->Amio initiated.   Sleep apnea    Sleep apnea     Medications:  Warfarin 5 mg daily prior to admission (medication reconciliation pending)  Assessment: Patient is a 60 y/o M with medical history as above and including Afib and history of VTE on warfarin who presented to the ED 8/9 with SOB / diarrhea / feeling unwell. Pharmacy consulted to assist in management and monitoring of warfarin while patient is admitted.  H&H with mild anemia  (c/w BL), platelets within normal limits.  LFTs: Within normal limits. Albumin 2.8 DDI: Amiodarone  Date INR Plan  8/9 1.2 5 mg  8/10 1.2 7.5 mg    Goal of Therapy:  INR 2-3  Plan:  --INR is subtherapeutic and un-changed from yesterday --Will give warfarin 7.5 mg tonight (1.5x home dose) --Daily INR per protocol --CBC at least every 3 days  10/9 09/24/2020,9:35 AM
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
## 2020-09-25 ENCOUNTER — Other Ambulatory Visit: Payer: Self-pay

## 2020-09-25 DIAGNOSIS — R4922 Hyponasality: Secondary | ICD-10-CM

## 2020-09-25 DIAGNOSIS — I509 Heart failure, unspecified: Secondary | ICD-10-CM | POA: Diagnosis not present

## 2020-09-25 LAB — CBC WITH DIFFERENTIAL/PLATELET
Abs Immature Granulocytes: 0.02 10*3/uL (ref 0.00–0.07)
Basophils Absolute: 0 10*3/uL (ref 0.0–0.1)
Basophils Relative: 0 %
Eosinophils Absolute: 0.6 10*3/uL — ABNORMAL HIGH (ref 0.0–0.5)
Eosinophils Relative: 9 %
HCT: 36.9 % — ABNORMAL LOW (ref 39.0–52.0)
Hemoglobin: 12 g/dL — ABNORMAL LOW (ref 13.0–17.0)
Immature Granulocytes: 0 %
Lymphocytes Relative: 17 %
Lymphs Abs: 1.2 10*3/uL (ref 0.7–4.0)
MCH: 30.7 pg (ref 26.0–34.0)
MCHC: 32.5 g/dL (ref 30.0–36.0)
MCV: 94.4 fL (ref 80.0–100.0)
Monocytes Absolute: 0.6 10*3/uL (ref 0.1–1.0)
Monocytes Relative: 8 %
Neutro Abs: 4.7 10*3/uL (ref 1.7–7.7)
Neutrophils Relative %: 66 %
Platelets: 188 10*3/uL (ref 150–400)
RBC: 3.91 MIL/uL — ABNORMAL LOW (ref 4.22–5.81)
RDW: 15.7 % — ABNORMAL HIGH (ref 11.5–15.5)
WBC: 7.1 10*3/uL (ref 4.0–10.5)
nRBC: 0 % (ref 0.0–0.2)

## 2020-09-25 LAB — BASIC METABOLIC PANEL
Anion gap: 10 (ref 5–15)
BUN: 10 mg/dL (ref 6–20)
CO2: 35 mmol/L — ABNORMAL HIGH (ref 22–32)
Calcium: 8.5 mg/dL — ABNORMAL LOW (ref 8.9–10.3)
Chloride: 88 mmol/L — ABNORMAL LOW (ref 98–111)
Creatinine, Ser: 0.75 mg/dL (ref 0.61–1.24)
GFR, Estimated: >60 mL/min (ref >60)
Glucose, Bld: 139 mg/dL — ABNORMAL HIGH (ref 70–99)
Potassium: 4.1 mmol/L (ref 3.5–5.1)
Sodium: 133 mmol/L — ABNORMAL LOW (ref 135–145)

## 2020-09-25 LAB — PROTIME-INR
INR: 1.2 (ref 0.8–1.2)
Prothrombin Time: 15.5 seconds — ABNORMAL HIGH (ref 11.4–15.2)

## 2020-09-25 LAB — GLUCOSE, CAPILLARY
Glucose-Capillary: 127 mg/dL — ABNORMAL HIGH (ref 70–99)
Glucose-Capillary: 122 mg/dL — ABNORMAL HIGH (ref 70–99)
Glucose-Capillary: 131 mg/dL — ABNORMAL HIGH (ref 70–99)
Glucose-Capillary: 134 mg/dL — ABNORMAL HIGH (ref 70–99)

## 2020-09-25 LAB — MAGNESIUM: Magnesium: 1.6 mg/dL — ABNORMAL LOW (ref 1.7–2.4)

## 2020-09-25 MED ORDER — SODIUM CHLORIDE 0.9% FLUSH
10.0000 mL | Freq: Two times a day (BID) | INTRAVENOUS | Status: DC
  Administered 2020-09-25 – 2020-09-27 (×3): 10 mL via INTRAVENOUS

## 2020-09-25 MED ORDER — OXYCODONE-ACETAMINOPHEN 7.5-325 MG PO TABS
1.0000 | ORAL_TABLET | ORAL | Status: DC | PRN
  Administered 2020-09-25 – 2020-09-27 (×7): 1 via ORAL
  Filled 2020-09-25 (×6): qty 1

## 2020-09-25 MED ORDER — MAGNESIUM SULFATE 2 GM/50ML IV SOLN
2.0000 g | Freq: Once | INTRAVENOUS | Status: AC
  Administered 2020-09-25: 2 g via INTRAVENOUS
  Filled 2020-09-25: qty 50

## 2020-09-25 MED ORDER — WARFARIN SODIUM 7.5 MG PO TABS
7.5000 mg | ORAL_TABLET | Freq: Once | ORAL | Status: AC
  Administered 2020-09-25: 7.5 mg via ORAL
  Filled 2020-09-25: qty 1

## 2020-09-25 MED ORDER — AMIODARONE HCL 200 MG PO TABS
400.0000 mg | ORAL_TABLET | Freq: Two times a day (BID) | ORAL | Status: DC
  Administered 2020-09-25 – 2020-09-27 (×5): 400 mg via ORAL
  Filled 2020-09-25 (×5): qty 2

## 2020-09-25 NOTE — Consult Note (Signed)
WOC consult requested for several wounds.  This was already performed yesterday when the patient was in the ED; please refer to previous progress notes for assessment and measurements, and topical treatment orders have been provided for bedside nurses to perform daily. Please re-consult if further assistance is needed.  Thank-you,  Cammie Mcgee MSN, RN, CWOCN, [place], CNS [phone]

## 2020-09-25 NOTE — Evaluation (Signed)
Physical Therapy Evaluation Patient Details Name: [name] [name] MRN: [id number] DOB: [birth year] Today's Date: 09/25/2020   History of Present Illness  60 y.o. male with history of morbid obesity (BMI is 58.54 kg/m) obstructive sleep apnea, COPD, HFR EF with an ejection fraction of 30 to 35% by echo in February 2022, chronic respiratory failure on 4 L of oxygen at home, on warfarin for venous thromboembolism, lymphedema, hypothyroidism and ongoing alcohol abuse with medical noncompliance.  He presents emergency room with shortness of breath.  He was discharged from the hospital 10 days ago where he was admitted for similar problems.   Clinical Impression  Pt admitted with above diagnosis. Pt received supine in bed agreeable to PT services. Pt's PLOF, home set up, DME all remain the same compared to previous admission. Pt well known to hospital and rehab staff. At baseline pt is a household Ambulator with [phone] and use power w/c for community tasks. Pt resting at baseline 4L/min via [place] with HR trending in 80's and SPO2 > 95%. Pt overall required supervision for all mobility. For supine to sit pt relies on minimal cuing for hand placement and use of momentum and HOB elevated to sit Eob due to body habitus. Able to stand ut relies on UE support on BRW due to LE fatigue and SOB. After 20-30 sec requesting a seated rest. After 2 min rest pt stood again to BRW with fresh linens placed on bed. Pt side stepped towards HOB 2' before sitting. Supervision again to return to supine. Pt independent with use of hospital bed to scoot up in bed with reverse trendelenburg position returning to supine with HOB elevated when therapy leaving room. Call bell and all needs within reach. Anticipate pt will be safe to D/c home with [facility] PT by discharge. Pt currently with functional limitations due to the deficits listed below (see PT Problem List). Pt will benefit from skilled PT to increase their independence and safety with  mobility to allow discharge to the venue listed below.     Follow Up Recommendations Supervision/Assistance - 24 hour;Home health PT    Equipment Recommendations  None recommended by PT    Recommendations for Other Services       Precautions / Restrictions Precautions Precautions: Fall Restrictions Weight Bearing Restrictions: No      Mobility  Bed Mobility Overal bed mobility: Needs Assistance Bed Mobility: Supine to Sit     Supine to sit: Min guard          Transfers Overall transfer level: Needs assistance Equipment used: Rolling walker (2 wheeled) Transfers: Sit to/from Stand Sit to Stand: Supervision            Ambulation/Gait Ambulation/Gait assistance: Min guard Gait Distance (Feet): 2 Feet Assistive device: Rolling walker (2 wheeled) Gait Pattern/deviations: Step-to pattern     General Gait Details: Able to side step towards [facility] with BRW  Stairs            Wheelchair Mobility    Modified Rankin (Stroke Patients Only)       Balance Overall balance assessment: Mild deficits observed, not formally tested Sitting-balance support: No upper extremity supported;Feet supported Sitting balance-Leahy Scale: Good     Standing balance support: Bilateral upper extremity supported Standing balance-Leahy Scale: Fair Standing balance comment: Poor tolerance to standing at BRW due to reports of SOB  Pertinent Vitals/Pain Pain Assessment: Faces Faces Pain Scale: Hurts little more Pain Location: R shoulder Pain Descriptors / Indicators: Aching;Sore Pain Intervention(s): Limited activity within patient's tolerance;Monitored during session    Home Living Family/patient expects to be discharged to:: Private residence Living Arrangements: Other (Comment);Non-relatives/Friends Available Help at Discharge: Other (Comment) (Reports having room mate) Type of Home: Apartment Home Access: Level entry     Home  Layout: One level Home Equipment: Walker - 4 wheels;Shower seat;Hand held shower head;Wheelchair - power Additional Comments: Sleeps in regular bed, lift chair    Prior Function Level of Independence: Needs assistance   Gait / Transfers Assistance Needed: Bariatric rollator and scooter for functional mobility, more difficulty with amb recently, several recent hospital admissions  ADL's / Homemaking Assistance Needed: pt used to have aide help, reports increased diffiuclty with HH IADLs and even aspects of general self care, especially since becoming ill with COVID        Hand Dominance   Dominant Hand: Right    Extremity/Trunk Assessment   Upper Extremity Assessment Upper Extremity Assessment: Generalized weakness    Lower Extremity Assessment Lower Extremity Assessment: Generalized weakness    Cervical / Trunk Assessment Cervical / Trunk Assessment: Normal  Communication   Communication: No difficulties  Cognition Arousal/Alertness: Awake/alert Behavior During Therapy: WFL for tasks assessed/performed Overall Cognitive Status: Within Functional Limits for tasks assessed                                        General Comments      Exercises     Assessment/Plan    PT Assessment Patient needs continued PT services  PT Problem List Decreased strength;Decreased range of motion;Decreased activity tolerance;Decreased balance;Decreased mobility;Decreased safety awareness;Cardiopulmonary status limiting activity;Pain       PT Treatment Interventions DME instruction;Gait training;Stair training;Functional mobility training;Therapeutic activities;Therapeutic exercise;Balance training;Patient/family education    PT Goals (Current goals can be found in the Care Plan section)  Acute Rehab PT Goals Patient Stated Goal: to go home PT Goal Formulation: With patient Time For Goal Achievement: 10/09/20 Potential to Achieve Goals: Fair    Frequency Min  2X/week   Barriers to discharge        Co-evaluation               AM-PAC PT "6 Clicks" Mobility  Outcome Measure Help needed turning from your back to your side while in a flat bed without using bedrails?: A Little Help needed moving from lying on your back to sitting on the side of a flat bed without using bedrails?: A Little Help needed moving to and from a bed to a chair (including a wheelchair)?: A Little Help needed standing up from a chair using your arms (e.g., wheelchair or bedside chair)?: A Little Help needed to walk in hospital room?: A Lot Help needed climbing 3-5 steps with a railing? : Total 6 Click Score: 15    End of Session Equipment Utilized During Treatment: Oxygen Activity Tolerance: Patient limited by fatigue Patient left: in bed;with call bell/phone within reach Nurse Communication: Mobility status PT Visit Diagnosis: Muscle weakness (generalized) (M62.81);Difficulty in walking, not elsewhere classified (R26.2);Pain;Unsteadiness on feet (R26.81) Pain - Right/Left: Right Pain - part of body: Shoulder    Time: 0258-5277 PT Time Calculation (min) (ACUTE ONLY): 17 min   Charges:   PT Evaluation $PT Eval Moderate Complexity: 1 Mod PT Treatments $Therapeutic Activity:  8-22 mins       [name]. Fairly IV, PT, DPT Physical Therapist- [place]  [facility]  09/25/2020, 2:55 PM

## 2020-09-25 NOTE — Progress Notes (Signed)
Patient Name: [name] [name] Date of Encounter: 09/25/2020  Hospital Problem List     Principal Problem:   Acute on chronic combined systolic and diastolic CHF (congestive heart failure) (HCC) Active Problems:   Lymphedema   Chronic atrial fibrillation (HCC)   Type 2 diabetes mellitus with other specified complication (HCC)   HTN (hypertension)   Morbid obesity with BMI of 60.0-69.9, adult (HCC)   Personal history of DVT (deep vein thrombosis)   Atrial fibrillation with rapid ventricular response (HCC)   Major depressive disorder, recurrent episode, in partial remission (HCC)   Noncompliance   Hypothyroidism   Chronic anticoagulation (warfarin  COUMADIN)   COPD (chronic obstructive pulmonary disease) (HCC)   Chronic respiratory failure [facility])    Patient Profile     60 y.o. male with history of morbid obesity (BMI is 58.54 kg/m) obstructive sleep apnea, COPD, HFrEF with an ejection fraction of 30 to 35% by echo in February 2022, chronic respiratory failure on 4 L of oxygen at home, on warfarin for venous thromboembolism, lymphedema, hypothyroidism and ongoing alcohol abuse with medical noncompliance.  He presents emergency room with shortness of breath.  He was discharged from the hospital 10 days ago where he was admitted for similar problems.  Patient is a very difficult historian.  It is unclear whether he is compliant with his medications.  He is unaware of what he is on. CXR revealed central pulmonary vascular congestion with stable cardiomegaly. Chest ct showed no pe with moderate global cardiomegaly and bibasilar stenosis. EKG showed afib with rapid vr. Troponins flat and do not appear to represent acute ischemia. He complaiins of sob but no chest pain  Subjective   Less sob. Heart rate improved but still in afib. Main complaints are right shoulder pain and hip pain.   Inpatient Medications     allopurinol  100 mg Oral Daily   amiodarone  400 mg Oral BID    atorvastatin  40 mg Oral Daily   budesonide (PULMICORT) nebulizer solution  0.25 mg Nebulization BID   Chlorhexidine Gluconate Cloth  6 each Topical Q0600   colchicine  0.6 mg Oral Daily   collagenase   Topical Daily   DULoxetine  20 mg Oral Daily   folic acid  1 mg Oral Daily   furosemide  60 mg Intravenous Q12H   insulin aspart  0-5 Units Subcutaneous QHS   insulin aspart  0-9 Units Subcutaneous TID WC   levothyroxine  75 mcg Oral QAC breakfast   metoprolol tartrate  50 mg Oral BID   montelukast  10 mg Oral QHS   multivitamin with minerals  1 tablet Oral Daily   sacubitril-valsartan  1 tablet Oral BID   spironolactone  12.5 mg Oral Daily   thiamine  100 mg Oral Daily   Or   thiamine  100 mg Intravenous Daily   tiotropium  18 mcg Inhalation Daily   traZODone  100 mg Oral QHS   Warfarin - Pharmacist Dosing Inpatient   Does not apply q1600    Vital Signs    Vitals:   09/25/20 0500 09/25/20 0600 09/25/20 0836 09/25/20 0900  BP: 95/68 109/75  106/69  Pulse: 83 68  85  Resp: 18 16  12   Temp:   (!) 96.8 F (36 C)   TempSrc:   Oral   SpO2: 100% 100%  100%  Weight: (!) 175 kg     Height:        Intake/Output Summary (Last 24  hours) at 09/25/2020 1201 Last data filed at 09/25/2020 0951 Gross per 24 hour  Intake 1521.88 ml  Output 3195 ml  Net -1673.12 ml   Filed Weights   09/23/20 0752 09/25/20 0500  Weight: (!) 174.6 kg (!) 175 kg    Physical Exam    GEN: Morbidly obese HEENT: normal.  Neck: Supple, no JVD, carotid bruits, or masses. Cardiac: irr, irr Respiratory:  Respirations regular and unlabored,  GI: Soft, nontender, nondistended, BS + x 4. MS: no deformity or atrophy. Skin: warm and dry, no rash. Neuro:  Strength and sensation are intact. Psych: Normal affect.  Labs    CBC Recent Labs    09/23/20 0755 09/24/20 0648 09/25/20 0652  WBC 8.6 4.8 7.1  NEUTROABS 6.6  --  4.7  HGB 11.7* 10.9* 12.0*  HCT 35.4* 32.6* 36.9*  MCV 91.7 93.7 94.4  PLT 238  133* 188   Basic Metabolic Panel Recent Labs    94/85/46 0755 09/24/20 0648 09/25/20 0652  NA 137 135 133*  K 4.0 3.4* 4.1  CL 95* 90* 88*  CO2 27 35* 35*  GLUCOSE 146* 162* 139*  BUN 15 9 10   CREATININE 0.58* 0.71 0.75  CALCIUM 7.9* 8.0* 8.5*  MG 1.5*  --  1.6*   Liver Function Tests Recent Labs    09/23/20 0755  AST 25  ALT 16  ALKPHOS 87  BILITOT 0.8  PROT 6.2*  ALBUMIN 2.8*   No results for input(s): LIPASE, AMYLASE in the last 72 hours. Cardiac Enzymes No results for input(s): CKTOTAL, CKMB, CKMBINDEX, TROPONINI in the last 72 hours. BNP Recent Labs    09/23/20 0755  BNP 133.4*   D-Dimer Recent Labs    09/23/20 0804  DDIMER 2.17*   Hemoglobin A1C No results for input(s): HGBA1C in the last 72 hours. Fasting Lipid Panel No results for input(s): CHOL, HDL, LDLCALC, TRIG, CHOLHDL, LDLDIRECT in the last 72 hours. Thyroid Function Tests No results for input(s): TSH, T4TOTAL, T3FREE, THYROIDAB in the last 72 hours.  Invalid input(s): FREET3  Telemetry    Afib with controlled vr  ECG    Afib with rvr  Radiology    DG Chest 1 View  Result Date: 09/07/2020 CLINICAL DATA:  Chest pain. EXAM: CHEST  1 VIEW COMPARISON:  August 30, 2020 FINDINGS: Stable cardiomegaly. No pneumothorax. No nodules or masses. No focal infiltrates. No overt edema. IMPRESSION: No active disease. Electronically Signed   By: September 01, 2020 III M.D   On: 09/07/2020 12:01   CT CHEST W CONTRAST  Result Date: 08/31/2020 CLINICAL DATA:  60 year old male with clinical suspicion for pulmonary embolism. EXAM: CT CHEST WITH CONTRAST TECHNIQUE: Multidetector CT imaging of the chest was performed during intravenous contrast administration. CONTRAST:  46 OMNIPAQUE IOHEXOL 350 MG/ML SOLN COMPARISON:  Chest CT 06/16/2020. FINDINGS: Cardiovascular: Suboptimal contrast bolus. With these limitations in mind, there are no central, lobar or segmental sized filling defect to indicate clinically  relevant pulmonary embolism. Smaller subsegmental sized pulmonary emboli cannot be entirely excluded. Heart size is enlarged with left ventricular and left atrial dilatation. There is no significant pericardial fluid, thickening or pericardial calcification. There is aortic atherosclerosis, as well as atherosclerosis of the great vessels of the mediastinum and the coronary arteries, including calcified atherosclerotic plaque in the left main, left anterior descending, left circumflex and right coronary arteries. Calcifications of the aortic valve. Mediastinum/Nodes: No pathologically enlarged mediastinal or hilar lymph nodes. Esophagus is unremarkable in appearance. No axillary lymphadenopathy. Lungs/Pleura: Scarring  in the periphery of the left lower lobe, similar to the prior study. No acute consolidative airspace disease. No pleural effusions. No suspicious appearing pulmonary nodules or masses are noted. Upper Abdomen: Aortic atherosclerosis. Severe diffuse low attenuation throughout the visualized hepatic parenchyma, indicative of severe hepatic steatosis. Musculoskeletal: There are no aggressive appearing lytic or blastic lesions noted in the visualized portions of the skeleton. IMPRESSION: 1. Limited study demonstrating no evidence of clinically relevant central, lobar or segmental sized pulmonary embolism. 2. No acute findings in the thorax to account for the patient's symptoms. 3. Aortic atherosclerosis, in addition to left main and 3 vessel coronary artery disease. Please note that although the presence of coronary artery calcium documents the presence of coronary artery disease, the severity of this disease and any potential stenosis cannot be assessed on this non-gated CT examination. Assessment for potential risk factor modification, dietary therapy or pharmacologic therapy may be warranted, if clinically indicated. 4. There are calcifications of the aortic valve. Echocardiographic correlation for  evaluation of potential valvular dysfunction may be warranted if clinically indicated. 5. Severe hepatic steatosis. Aortic Atherosclerosis (ICD10-I70.0). Electronically Signed   By: Trudie Reed M.D.   On: 08/31/2020 12:20   CT Angio Chest PE W and/or Wo Contrast  Result Date: 09/23/2020 CLINICAL DATA:  61 year old male with shortness of breath, history of recent COVID infection. EXAM: CT ANGIOGRAPHY CHEST WITH CONTRAST TECHNIQUE: Multidetector CT imaging of the chest was performed using the standard protocol during bolus administration of intravenous contrast. Multiplanar CT image reconstructions and MIPs were obtained to evaluate the vascular anatomy. CONTRAST:  Seventy-five mL Omnipaque 350, intravenous COMPARISON:  08/31/2020 FINDINGS: Cardiovascular: Satisfactory opacification of the pulmonary arteries to the segmental level. No evidence of pulmonary embolism. Unchanged moderate global cardiomegaly. Moderate coronary atherosclerotic calcifications. No pericardial effusion. Mild scattered atherosclerotic calcifications of a normal caliber thoracic aorta. Mediastinum/Nodes: No enlarged mediastinal, hilar, or axillary lymph nodes. Thyroid gland, trachea, and esophagus demonstrate no significant findings. Lungs/Pleura: Mild bibasilar subsegmental atelectasis. No consolidative process is. No significant pleural effusion or evidence of pneumothorax. Upper Abdomen: Similar appearing severe hepatic steatosis. The remaining visualized upper abdomen is within normal limits. Musculoskeletal: Similar appearing multilevel degenerative changes of the thoracic spine, most pronounced at T8-T9. No acute osseous abnormality or aggressive appearing osseous lesion. Review of the MIP images confirms the above findings. IMPRESSION: Vascular: 1. No evidence of pulmonary embolism. 2. Unchanged moderate global cardiomegaly. 3.  Coronary and aortic atherosclerosis (ICD10-I70.0). Non-Vascular: 1. Mild bibasilar subsegmental  atelectasis. 2. Severe hepatic steatosis. Marliss Coots, MD [facility] Radiology Electronically Signed   By: Marliss Coots MD   On: 09/23/2020 11:10   CT ABDOMEN PELVIS W CONTRAST  Result Date: 08/30/2020 CLINICAL DATA:  Abdominal pain EXAM: CT ABDOMEN AND PELVIS WITH CONTRAST TECHNIQUE: Multidetector CT imaging of the abdomen and pelvis was performed using the standard protocol following bolus administration of intravenous contrast. CONTRAST:  OMNIPAQUE IOHEXOL 300 MG/ML  SOLN COMPARISON:  CT 06/16/2020 FINDINGS: Lower chest: No pleural or pericardial effusion. Visualized lung bases clear. Hepatobiliary: Fatty liver without discrete lesion or biliary ductal dilatation. No calcified stones in the gallbladder. Pancreas: Unremarkable. No pancreatic ductal dilatation or surrounding inflammatory changes. Spleen: Normal in size without focal abnormality. Adrenals/Urinary Tract: Adrenal glands unremarkable. No renal mass or hydronephrosis. Limited renal excretion on delayed scans. The urinary bladder is incompletely distended. Stomach/Bowel: Stomach is physiologically distended by ingested fluid. The small bowel is nondilated. Normal appendix. The colon is nondilated  with scattered diverticula, no focal adjacent inflammatory change. Vascular/Lymphatic: Scattered aortoiliac calcified plaque without aneurysm. Portal vein patent. Single 1.1 cm enlarged left common iliac chain lymph node, stable. Prominent bilateral inguinal lymph nodes as before. Reproductive: Normal-sized prostate with central coarse calcifications. Other: Bilateral pelvic phleboliths.  No ascites.  No free air. Musculoskeletal: Obesity. Multilevel spondylitic changes in the lower thoracic and lumbar spine. Advanced right hip DJD. No fracture or worrisome bone lesion. IMPRESSION: 1. No acute findings. 2. Scattered colonic diverticula. 3. Fatty liver. 4.  Aortic Atherosclerosis (ICD10-170.0).  Electronically Signed   By: Corlis Leak M.D.   On: 08/30/2020 11:47   US Venous Img Lower Bilateral (DVT)  Result Date: 08/31/2020 CLINICAL DATA:  Edema, pain, history of PE EXAM: BILATERAL LOWER EXTREMITY VENOUS DOPPLER ULTRASOUND TECHNIQUE: Gray-scale sonography with compression, as well as color and duplex ultrasound, were performed to evaluate the deep venous system(s) from the level of the common femoral vein through the popliteal and proximal calf veins. COMPARISON:  01/08/2020 and previous FINDINGS: VENOUS Normal compressibility of the common femoral, superficial femoral, and popliteal veins, as well as the visualized calf veins. Visualized portions of profunda femoral vein and great saphenous vein unremarkable. No filling defects to suggest DVT on grayscale or color Doppler imaging. Doppler waveforms show normal direction of venous flow, normal respiratory phasicity and response to augmentation. OTHER Right calf edema.  Left calf edema. Limitations: Technologist describes technically difficult study secondary to body habitus BMI 61. IMPRESSION: 1. Negative Electronically Signed   By: Corlis Leak M.D.   On: 08/31/2020 14:19   DG Chest Portable 1 View  Result Date: 09/23/2020 CLINICAL DATA:  Chest pain, shortness of breath. EXAM: PORTABLE CHEST 1 VIEW COMPARISON:  September 07, 2020. FINDINGS: Stable cardiomegaly with central pulmonary vascular congestion. No consolidative process is noted. The visualized skeletal structures are unremarkable. IMPRESSION: Stable cardiomegaly with central pulmonary vascular congestion. Electronically Signed   By: Lupita Raider M.D.   On: 09/23/2020 08:42   DG Chest Portable 1 View  Result Date: 08/30/2020 CLINICAL DATA:  Per Triage: Pt arrives via EMS from home after having increased SHOB- per EMS pt was 95% on his chronic 3L [place]- pt was also vomiting on the ems stretcher Patient alert and oriented. Patient able to move with help. Patient is naus.*comment was truncated*SHOB  EXAM: PORTABLE CHEST 1 VIEW COMPARISON:  08/30/2018 FINDINGS: Stable cardiomegaly. There is increase in vascular congestion pattern. Probable mild edema superimposed on the vascular congestion. No pneumothorax. No focal consolidation. IMPRESSION: Cardiomegaly and increased pulmonary edema pattern. Electronically Signed   By: Genevive Bi M.D.   On: 08/30/2020 09:19   ECHOCARDIOGRAM COMPLETE  Result Date: 09/23/2020    ECHOCARDIOGRAM REPORT   Patient Name:   PERVIS MACINTYRE Date of Exam: 09/23/2020 Medical Rec #:  [id number]              Height:       68.0 in Accession #:    [id number]             Weight:       385.0 lb Date of Birth:  [birth year]              BSA:          2.700 m Patient Age:    60 years               BP:           140/93 mmHg Patient Gender: M  HR:           57 bpm. Exam Location:  [facility] Procedure: 2D Echo, Cardiac Doppler and Color Doppler Indications:     CHF-acute systolic I50.21  History:         Patient has prior history of Echocardiogram examinations, most                  recent 08/30/2020. Previous Myocardial Infarction; COPD. NICM.  Sonographer:     Cristela Blue RDCS (AE) Referring Phys:  [id number] Earl Lites DANFORD Diagnosing Phys: Harold Hedge MD  Sonographer Comments: Technically difficult study due to poor echo windows, no apical window and no subcostal window. IMPRESSIONS  1. Left ventricular ejection fraction, by estimation, is 25 to 30%. The left ventricle has severely decreased function. The left ventricle demonstrates global hypokinesis. The left ventricular internal cavity size was mildly dilated. Left ventricular diastolic parameters were normal.  2. Right ventricular systolic function is normal. The right ventricular size is mildly enlarged.  3. Left atrial size was mildly dilated.  4. Right atrial size was mildly dilated.  5. The mitral valve was not well visualized. Mild mitral valve regurgitation.  6. The aortic valve was not well visualized.  Aortic valve regurgitation is trivial. FINDINGS  Left Ventricle: Left ventricular ejection fraction, by estimation, is 25 to 30%. The left ventricle has severely decreased function. The left ventricle demonstrates global hypokinesis. The left ventricular internal cavity size was mildly dilated. There is no left ventricular hypertrophy. Left ventricular diastolic parameters were normal. Right Ventricle: The right ventricular size is mildly enlarged. No increase in right ventricular wall thickness. Right ventricular systolic function is normal. Left Atrium: Left atrial size was mildly dilated. Right Atrium: Right atrial size was mildly dilated. Pericardium: There is no evidence of pericardial effusion. Mitral Valve: The mitral valve was not well visualized. Mild mitral valve regurgitation. Tricuspid Valve: The tricuspid valve is not well visualized. Tricuspid valve regurgitation is mild. Aortic Valve: The aortic valve was not well visualized. Aortic valve regurgitation is trivial. Pulmonic Valve: The pulmonic valve was not assessed. Pulmonic valve regurgitation is not visualized. Aorta: The aortic root was not well visualized. IAS/Shunts: The interatrial septum was not assessed.  LEFT VENTRICLE PLAX 2D LVIDd:         5.58 cm LVIDs:         4.88 cm LV PW:         1.34 cm LV IVS:        1.65 cm LVOT diam:     2.20 cm LVOT Area:     3.80 cm  LEFT ATRIUM         Index LA diam:    5.10 cm 1.89 cm/m                        PULMONIC VALVE AORTA                 PV Vmax:        0.80 m/s Ao Root diam: 3.50 cm PV Peak grad:   2.5 mmHg                       RVOT Peak grad: 7 mmHg  TRICUSPID VALVE TR Peak grad:   6.6 mmHg TR Vmax:        128.00 cm/s  SHUNTS Systemic Diam: 2.20 cm Harold Hedge MD Electronically signed by Harold Hedge MD Signature Date/Time: 09/23/2020/3:06:29 PM  Final    ECHOCARDIOGRAM COMPLETE  Result Date: 08/31/2020    ECHOCARDIOGRAM REPORT   [facility] Date of Exam: 08/30/2020  Medical Rec #:  [id number]              Height:       68.0 in Accession #:    [id number]             Weight:       350.0 lb Date of Birth:  1960/08/10              BSA:          2.592 m Patient Age:    59 years               BP:           127/92 mmHg Patient Gender: M                      HR:           115 bpm. Exam Location:  [facility] Procedure: 2D Echo and Intracardiac Opacification Agent Indications:     CHF I50.21  History:         Patient has prior history of Echocardiogram examinations, most                  recent 04/07/2020.  Sonographer:     Overton Mamikeshia Johnson RDCS Referring Phys:  [id number] Brien FewXILIN NIU Diagnosing Phys: Chilton Siiffany Enon MD  Sonographer Comments: Technically challenging study due to limited acoustic windows, Technically difficult study due to poor echo windows, no apical window, no parasternal window, no subcostal window and patient is morbidly obese. Image acquisition challenging due to patient body habitus. IMPRESSIONS  1. Left ventricular ejection fraction, by estimation, is unable to assess%. The left ventricle has severely decreased function. The left ventricle has no regional wall motion abnormalities. The left ventricular internal cavity size was moderately dilated. There is unable to assess left ventricular hypertrophy. Left ventricular diastolic function could not be evaluated.  2. Right ventricular systolic function is severely reduced. The right ventricular size is not well visualized.  3. The mitral valve was not well visualized. unable to assess mitral valve regurgitation. unable to assess mitral stenosis.  4. The aortic valve was not well visualized. Aortic valve regurgitation unable to assess. unable to assess.  5. Pulmonic valve regurgitation unable to assess. unable to assess pulmonic stenosis. FINDINGS  Left Ventricle: Left ventricular ejection fraction, by estimation, is unable to assess%. The left ventricle has severely decreased function. The left ventricle has no regional wall motion  abnormalities. Definity contrast agent was given IV to delineate the left ventricular endocardial borders. The left ventricular internal cavity size was moderately dilated. There is unable to assess left ventricular hypertrophy. Left ventricular diastolic function could not be evaluated. Right Ventricle: The right ventricular size is not well visualized. No increase in right ventricular wall thickness. Right ventricular systolic function is severely reduced. Left Atrium: Left atrial size was not well visualized. Right Atrium: Right atrial size was not well visualized. Pericardium: The pericardium was not well visualized. Mitral Valve: The mitral valve was not well visualized. Unable to assess mitral valve regurgitation. Unable to assess mitral valve stenosis. Tricuspid Valve: The tricuspid valve is not well visualized. Tricuspid valve regurgitation is trivial. No evidence of tricuspid stenosis. Aortic Valve: The aortic valve was not well visualized. Aortic valve regurgitation unable to assess. Unable to assess. Aortic valve  peak gradient measures 5.6 mmHg. Pulmonic Valve: The pulmonic valve was not well visualized. Pulmonic valve regurgitation unable to assess. Unable to assess pulmonic stenosis. Aorta: Aortic root could not be assessed. Venous: The pulmonary veins were not well visualized. The inferior vena cava was not well visualized. IAS/Shunts: The interatrial septum was not well visualized. Additional Comments: Image quality is very poor. Inadequate visualization of the valves and endocardium despite the use of Defnity conrast. Both right and left ventricular systolic function appears to be significantly imparied. Visualization is not adequate to quantify systolic function. Recommend cardiac MRI or TEE for better visualization if indicated.  AORTIC VALVE AV Vmax:      118.00 cm/s AV Peak Grad: 5.6 mmHg MITRAL VALVE               TRICUSPID VALVE MV Area (PHT): 3.81 cm    TR Peak grad:   12.8 mmHg MV Decel  Time: 199 msec    TR Vmax:        179.00 cm/s MV E velocity: 86.10 cm/s Chilton Si MD Electronically signed by Chilton Si MD Signature Date/Time: 08/31/2020/12:49:59 PM    Final     Assessment & Plan    60 y.o. male with history of morbid obesity (BMI is 58.54 kg/m) obstructive sleep apnea, COPD, HFR EF with an ejection fraction of 30 to 35% by echo in February 2022, chronic respiratory failure on 4 L of oxygen at home, on warfarin for venous thromboembolism, lymphedema, hypothyroidism and ongoing alcohol abuse with medical noncompliance.  He presents emergency room with shortness of breath.  He was discharged from the hospital 10 days ago where he was admitted for similar problems.  Patient is a very difficult historian.  It is unclear whether he is compliant with his medications.  He is unaware of what he is on. CXR revealed central pulmonary vascular congestion with stable cardiomegaly. Chest ct showed no pe with moderate gloal cardiomegaly and bibasilar stenosis. EKG showed afib with rapid vr. Troponins flat and do not appear to represent acute ischemia.    1 Afib -Rate is better controlled. WIll  convert to po amiodarone. Agree with metoprolol at current dose. At discharge, will change to toprol given ef. Continue with warfarin with an inr goal of 2-3   2. HFrEF-echo showed ef of 25-30%. Echo at previous echo done 08/30/20 was not able to have ef read due to poor windows. Is tolerating entresto, spironolactone and lasix thus far. Will follow electrolytes hemodynamics, and renal function. .Will follow BNP.    3. Lymphedema-chronic  Signed, Ranvir Renovato. Oaklyn Mans MD 09/25/2020, 12:01 PM  Pager: [phone]
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
## 2020-09-25 NOTE — Consult Note (Signed)
ANTICOAGULATION CONSULT NOTE  Pharmacy Consult for Warfarin Indication:  Afib / Hx VTE  Patient Measurements: Height: 5\' 8"  (172.7 cm) Weight: (!) 175 kg (385 lb 12.9 oz) IBW/kg (Calculated) : 68.4  Labs: Recent Labs    09/23/20 0755 09/23/20 0804 09/23/20 1109 09/24/20 0648 09/25/20 0652 09/25/20 1048  HGB 11.7*  --   --  10.9* 12.0*  --   HCT 35.4*  --   --  32.6* 36.9*  --   PLT 238  --   --  133* 188  --   LABPROT  --  15.1  --  14.8  --  15.5*  INR  --  1.2  --  1.2  --  1.2  CREATININE 0.58*  --   --  0.71 0.75  --   TROPONINIHS 37*  --  38*  --   --   --      Estimated Creatinine Clearance: 154.2 mL/min (by C-G formula based on SCr of 0.75 mg/dL).   Medical History: Past Medical History:  Diagnosis Date   Allergy    Anxiety    Arthritis    Asthma    Brain damage    Chronic pain of both knees 07/13/2018   Clotting disorder (HCC)    COPD (chronic obstructive pulmonary disease) (HCC)    Depression    GERD (gastroesophageal reflux disease)    HFrEF (heart failure with reduced ejection fraction) (HCC)    a. 03/2018 Echo: EF 25-30%, diff HK. Mod LAE.   History of DVT (deep vein thrombosis)    History of pulmonary embolism    a. Chronic coumadin.   Hypertension    MI (myocardial infarction) (HCC)    Morbid obesity (HCC)    Neck pain 07/13/2018   NICM (nonischemic cardiomyopathy) (HCC)    a. s/p Cath x 3 - reportedly nl cors. Last cath 2019 in [place]; b. a. 03/2018 Echo: EF 25-30%, diff HK.   Persistent atrial fibrillation (HCC)    a. 03/2018 s/p DCCV; b. CHA2DS2VASc = 1-->Xarelto (later changed to warfarin); c. 05/2018 recurrent afib-->Amio initiated.   Sleep apnea    Sleep apnea     Medications:  Warfarin 5 mg daily prior to admission  Assessment: Patient is a 60 y/o M with medical history as above and including Afib and history of VTE on warfarin who presented to the ED 8/9 with SOB / diarrhea / feeling unwell. Patient had two separate admission in July where  his INR was subtherapeutic on admission; question medication compliance. Pharmacy consulted to assist in management and monitoring of warfarin while patient is admitted.  H&H with mild anemia (c/w BL), platelets within normal limits.  LFTs: Within normal limits. Albumin 2.8 DDI: Amiodarone  Date INR Plan  8/9 1.2 5 mg  8/10 1.2 7.5 mg  8/11 1.2     Goal of Therapy:  INR 2-3  Plan:  --INR is subtherapeutic, remains unchanged --Will give warfarin 7.5 mg again tonight (1.5x home dose) --Daily INR per protocol --CBC at least every 3 days  10/11, PharmD, BCPS Clinical Pharmacist 09/25/2020 1:21 PM

## 2020-09-25 NOTE — Patient Outreach (Signed)
Care Coordination  09/25/2020  [name] [name] 10/07/1960 [id number]  [name] [name] is currently admitted as an inpatient at [facility]. The [facility] Managed Care team will follow the progress of [name] [name] and follow up upon discharge.    [name] Emms RN, BSN [place]  [facility]

## 2020-09-25 NOTE — Progress Notes (Signed)
PT Cancellation Note  Patient Details Name: [name] [name] MRN: [id number] DOB: 1960/09/11   Cancelled Treatment:    Reason Eval/Treat Not Completed: Patient not medically ready. PT monitoring pt per chart review. Pt placed on amioderone drip on 8/10 at 19:30. PT orders to hold ~24 hours from start of drip. PT will await update from rounds prior to attempting eval.   [name]. Fairly IV, PT, DPT Physical Therapist- [place]  [facility]  09/25/2020, 8:57 AM

## 2020-09-26 DIAGNOSIS — J441 Chronic obstructive pulmonary disease with (acute) exacerbation: Secondary | ICD-10-CM

## 2020-09-26 DIAGNOSIS — I509 Heart failure, unspecified: Secondary | ICD-10-CM | POA: Diagnosis not present

## 2020-09-26 DIAGNOSIS — I959 Hypotension, unspecified: Secondary | ICD-10-CM

## 2020-09-26 LAB — CBC WITH DIFFERENTIAL/PLATELET
Abs Immature Granulocytes: 0.02 10*3/uL (ref 0.00–0.07)
Basophils Absolute: 0 10*3/uL (ref 0.0–0.1)
Basophils Relative: 0 %
Eosinophils Absolute: 0.5 10*3/uL (ref 0.0–0.5)
Eosinophils Relative: 11 %
HCT: 36.1 % — ABNORMAL LOW (ref 39.0–52.0)
Hemoglobin: 11.5 g/dL — ABNORMAL LOW (ref 13.0–17.0)
Immature Granulocytes: 0 %
Lymphocytes Relative: 22 %
Lymphs Abs: 1 10*3/uL (ref 0.7–4.0)
MCH: 30.8 pg (ref 26.0–34.0)
MCHC: 31.9 g/dL (ref 30.0–36.0)
MCV: 96.8 fL (ref 80.0–100.0)
Monocytes Absolute: 0.4 10*3/uL (ref 0.1–1.0)
Monocytes Relative: 9 %
Neutro Abs: 2.7 10*3/uL (ref 1.7–7.7)
Neutrophils Relative %: 58 %
Platelets: 166 10*3/uL (ref 150–400)
RBC: 3.73 MIL/uL — ABNORMAL LOW (ref 4.22–5.81)
RDW: 15.7 % — ABNORMAL HIGH (ref 11.5–15.5)
WBC: 4.6 10*3/uL (ref 4.0–10.5)
nRBC: 0 % (ref 0.0–0.2)

## 2020-09-26 LAB — BASIC METABOLIC PANEL
Anion gap: 8 (ref 5–15)
BUN: 12 mg/dL (ref 6–20)
CO2: 37 mmol/L — ABNORMAL HIGH (ref 22–32)
Calcium: 8.8 mg/dL — ABNORMAL LOW (ref 8.9–10.3)
Chloride: 89 mmol/L — ABNORMAL LOW (ref 98–111)
Creatinine, Ser: 0.79 mg/dL (ref 0.61–1.24)
GFR, Estimated: >60 mL/min (ref >60)
Glucose, Bld: 105 mg/dL — ABNORMAL HIGH (ref 70–99)
Potassium: 4.7 mmol/L (ref 3.5–5.1)
Sodium: 134 mmol/L — ABNORMAL LOW (ref 135–145)

## 2020-09-26 LAB — GLUCOSE, CAPILLARY
Glucose-Capillary: 103 mg/dL — ABNORMAL HIGH (ref 70–99)
Glucose-Capillary: 131 mg/dL — ABNORMAL HIGH (ref 70–99)
Glucose-Capillary: 135 mg/dL — ABNORMAL HIGH (ref 70–99)
Glucose-Capillary: 136 mg/dL — ABNORMAL HIGH (ref 70–99)

## 2020-09-26 LAB — PROTIME-INR
INR: 1.4 — ABNORMAL HIGH (ref 0.8–1.2)
Prothrombin Time: 17.6 seconds — ABNORMAL HIGH (ref 11.4–15.2)

## 2020-09-26 LAB — MAGNESIUM: Magnesium: 1.5 mg/dL — ABNORMAL LOW (ref 1.7–2.4)

## 2020-09-26 MED ORDER — ALBUMIN HUMAN 25 % IV SOLN
25.0000 g | Freq: Once | INTRAVENOUS | Status: AC
  Administered 2020-09-26: 25 g via INTRAVENOUS
  Filled 2020-09-26: qty 100

## 2020-09-26 MED ORDER — METHOCARBAMOL 500 MG PO TABS
500.0000 mg | ORAL_TABLET | Freq: Three times a day (TID) | ORAL | Status: DC | PRN
  Administered 2020-09-26 – 2020-09-27 (×2): 500 mg via ORAL
  Filled 2020-09-26 (×5): qty 1

## 2020-09-26 MED ORDER — MAGNESIUM SULFATE 50 % IJ SOLN
3.0000 g | Freq: Once | INTRAVENOUS | Status: AC
  Administered 2020-09-26: 3 g via INTRAVENOUS
  Filled 2020-09-26: qty 6

## 2020-09-26 MED ORDER — WARFARIN SODIUM 7.5 MG PO TABS
7.5000 mg | ORAL_TABLET | Freq: Once | ORAL | Status: AC
  Administered 2020-09-26: 7.5 mg via ORAL
  Filled 2020-09-26: qty 1

## 2020-09-26 MED ORDER — TORSEMIDE 20 MG PO TABS
40.0000 mg | ORAL_TABLET | Freq: Two times a day (BID) | ORAL | Status: DC
  Administered 2020-09-26 – 2020-09-27 (×2): 40 mg via ORAL
  Filled 2020-09-26 (×2): qty 2

## 2020-09-26 NOTE — Progress Notes (Signed)
   09/26/20 1213  Assess: MEWS Score  Temp 97.6 F (36.4 C)  BP (!) 77/48 (will re-check)  Pulse Rate 82  Resp 16  SpO2 100 %  O2 Device Nasal Cannula  O2 Flow Rate (L/min) 4 L/min  Assess: MEWS Score  MEWS Temp 0  MEWS Systolic 2  MEWS Pulse 0  MEWS RR 0  MEWS LOC 0  MEWS Score 2  MEWS Score Color Yellow  Assess: if the MEWS score is Yellow or Red  Were vital signs taken at a resting state? Yes  Focused Assessment No change from prior assessment  Does the patient meet 2 or more of the SIRS criteria? No  MEWS guidelines implemented *See Row Information* Yes  Take Vital Signs  Increase Vital Sign Frequency  Yellow: Q 2hr X 2 then Q 4hr X 2, if remains yellow, continue Q 4hrs  Escalate  MEWS: Escalate Yellow: discuss with charge nurse/RN and consider discussing with provider and RRT  Notify: Charge Nurse/RN  Name of Charge Nurse/RN Notified [place], RN  Date Charge Nurse/RN Notified 09/26/20  Time Charge Nurse/RN Notified 1230  Notify: Provider  Provider Name/Title Zhang  Date Provider Notified 09/26/20  Time Provider Notified 1230  Notification Type Page  Notification Reason Other (Comment) (yellow mews)  Provider response See new orders (ordering albumin)  Date of Provider Response 09/26/20  Time of Provider Response 1240  Document  Patient Outcome  (TBD - will re-assess after albumin)  Progress note created (see row info) Yes  Assess: SIRS CRITERIA  SIRS Temperature  0  SIRS Pulse 0  SIRS Respirations  0  SIRS WBC 0  SIRS Score Sum  0
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
## 2020-09-26 NOTE — TOC Progression Note (Signed)
Transition of Care [facility]) - Progression Note    Patient Details  Name: [name] [name] MRN: [id number] Date of Birth: 05-29-1960  Transition of Care [facility]) CM/SW Contact  [name] Phone Number: [phone] 09/26/2020, 1:46 PM  Clinical Narrative:     CSW spoke with Diona Fanti [facility] DSS/APS [phone], [phone] on patient status update.  Ms. Yetta Barre stated the patient stated he was willing to go to a facility but he didn't want to return to [facility].  CSW reminded Ms. Yetta Barre patient has a history of non-compliant and leaving facilities AMA and he was going to be a difficult placement.  CSW also updated Ms. Yetta Barre on PT recommendation for H/H and patient would only be able to go to a facility for long term care and the patient has refused long term care in the past.  CSW stated I would update TOC CSW who is covering the unit the patient is on.   Expected Discharge Plan: Home w Home Health Services Barriers to Discharge: Continued Medical Work up, Unsafe home situation  Expected Discharge Plan and Services Expected Discharge Plan: Home w Home Health Services In-house Referral: Clinical Social Work   Post Acute Care Choice: NA Living arrangements for the past 2 months: Apartment                                       Social Determinants of Health (SDOH) Interventions    Readmission Risk Interventions Readmission Risk Prevention Plan 01/11/2020 12/24/2019 01/25/2019  Transportation Screening Complete Complete Complete  PCP or Specialist Appt within 3-5 Days - - -  HRI or Home Care Consult - - -  Social Work Consult for Recovery Care Planning/Counseling - - -  Palliative Care Screening - - -  Medication Review [facility]) Complete Complete Complete  PCP or Specialist appointment within 3-5 days of discharge Complete Complete Complete  HRI or Home Care Consult - - Complete  SW Recovery Care/Counseling Consult Complete Complete  Complete  Palliative Care Screening Complete Not Applicable Not Applicable  Skilled Nursing Facility Not Applicable Not Applicable Not Applicable
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
## 2020-09-26 NOTE — Consult Note (Signed)
ANTICOAGULATION CONSULT NOTE  Pharmacy Consult for Warfarin Indication:  Afib / Hx VTE  Patient Measurements: Height: 5\' 8"  (172.7 cm) Weight: (!) 181.1 kg (399 lb 4.1 oz) IBW/kg (Calculated) : 68.4  Labs: Recent Labs    09/24/20 0648 09/25/20 0652 09/25/20 1048 09/26/20 0722  HGB 10.9* 12.0*  --  11.5*  HCT 32.6* 36.9*  --  36.1*  PLT 133* 188  --  166  LABPROT 14.8  --  15.5* 17.6*  INR 1.2  --  1.2 1.4*  CREATININE 0.71 0.75  --  0.79     Estimated Creatinine Clearance: 157.6 mL/min (by C-G formula based on SCr of 0.79 mg/dL).   Medical History: Past Medical History:  Diagnosis Date   Allergy    Anxiety    Arthritis    Asthma    Brain damage    Chronic pain of both knees 07/13/2018   Clotting disorder (HCC)    COPD (chronic obstructive pulmonary disease) (HCC)    Depression    GERD (gastroesophageal reflux disease)    HFrEF (heart failure with reduced ejection fraction) (HCC)    a. 03/2018 Echo: EF 25-30%, diff HK. Mod LAE.   History of DVT (deep vein thrombosis)    History of pulmonary embolism    a. Chronic coumadin.   Hypertension    MI (myocardial infarction) (HCC)    Morbid obesity (HCC)    Neck pain 07/13/2018   NICM (nonischemic cardiomyopathy) (HCC)    a. s/p Cath x 3 - reportedly nl cors. Last cath 2019 in [place]; b. a. 03/2018 Echo: EF 25-30%, diff HK.   Persistent atrial fibrillation (HCC)    a. 03/2018 s/p DCCV; b. CHA2DS2VASc = 1-->Xarelto (later changed to warfarin); c. 05/2018 recurrent afib-->Amio initiated.   Sleep apnea    Sleep apnea     Medications:  Warfarin 5 mg daily prior to admission  Assessment: Patient is a 60 y/o M with medical history as above and including Afib and history of VTE on warfarin who presented to the ED 8/9 with SOB / diarrhea / feeling unwell. Patient had two separate admission in July where his INR was subtherapeutic on admission; question medication compliance. Pharmacy consulted to assist in management and  monitoring of warfarin while patient is admitted.  H&H with mild anemia (c/w BL), platelets within normal limits.  LFTs: Within normal limits. Albumin 2.8 DDI: Amiodarone  Date INR Plan  8/9 1.2 5 mg  8/10 1.2 7.5 mg  8/11 1.2 7.5 mg  8/12 1.4     Goal of Therapy:  INR 2-3  Plan:  --INR is subtherapeutic but trending upward --Will give warfarin 7.5 mg again tonight (1.5x home dose) --Daily INR per protocol --CBC at least every 3 days  10/12, PharmD Clinical Pharmacist 09/26/2020 12:18 PM

## 2020-09-27 DIAGNOSIS — I509 Heart failure, unspecified: Secondary | ICD-10-CM | POA: Diagnosis not present

## 2020-09-27 DIAGNOSIS — J9610 Chronic respiratory failure, unspecified whether with hypoxia or hypercapnia: Secondary | ICD-10-CM

## 2020-09-27 LAB — CBC WITH DIFFERENTIAL/PLATELET
Abs Immature Granulocytes: 0.02 10*3/uL (ref 0.00–0.07)
Basophils Absolute: 0 10*3/uL (ref 0.0–0.1)
Basophils Relative: 0 %
Eosinophils Absolute: 0.5 10*3/uL (ref 0.0–0.5)
Eosinophils Relative: 10 %
HCT: 32.4 % — ABNORMAL LOW (ref 39.0–52.0)
Hemoglobin: 10.5 g/dL — ABNORMAL LOW (ref 13.0–17.0)
Immature Granulocytes: 0 %
Lymphocytes Relative: 21 %
Lymphs Abs: 1.1 10*3/uL (ref 0.7–4.0)
MCH: 31.3 pg (ref 26.0–34.0)
MCHC: 32.4 g/dL (ref 30.0–36.0)
MCV: 96.4 fL (ref 80.0–100.0)
Monocytes Absolute: 0.7 10*3/uL (ref 0.1–1.0)
Monocytes Relative: 14 %
Neutro Abs: 2.7 10*3/uL (ref 1.7–7.7)
Neutrophils Relative %: 55 %
Platelets: 175 10*3/uL (ref 150–400)
RBC: 3.36 MIL/uL — ABNORMAL LOW (ref 4.22–5.81)
RDW: 15.8 % — ABNORMAL HIGH (ref 11.5–15.5)
WBC: 5 10*3/uL (ref 4.0–10.5)
nRBC: 0 % (ref 0.0–0.2)

## 2020-09-27 LAB — PROTIME-INR
INR: 1.6 — ABNORMAL HIGH (ref 0.8–1.2)
Prothrombin Time: 19.1 seconds — ABNORMAL HIGH (ref 11.4–15.2)

## 2020-09-27 LAB — BASIC METABOLIC PANEL
Anion gap: 9 (ref 5–15)
BUN: 18 mg/dL (ref 6–20)
CO2: 36 mmol/L — ABNORMAL HIGH (ref 22–32)
Calcium: 8.8 mg/dL — ABNORMAL LOW (ref 8.9–10.3)
Chloride: 90 mmol/L — ABNORMAL LOW (ref 98–111)
Creatinine, Ser: 0.8 mg/dL (ref 0.61–1.24)
GFR, Estimated: >60 mL/min (ref >60)
Glucose, Bld: 136 mg/dL — ABNORMAL HIGH (ref 70–99)
Potassium: 4.5 mmol/L (ref 3.5–5.1)
Sodium: 135 mmol/L (ref 135–145)

## 2020-09-27 LAB — GLUCOSE, CAPILLARY: Glucose-Capillary: 145 mg/dL — ABNORMAL HIGH (ref 70–99)

## 2020-09-27 LAB — MAGNESIUM: Magnesium: 1.8 mg/dL (ref 1.7–2.4)

## 2020-09-27 MED ORDER — TORSEMIDE 20 MG PO TABS: 40.0000 mg | ORAL_TABLET | Freq: Every day | ORAL | 0 refills | Status: AC

## 2020-09-27 MED ORDER — ALBUTEROL SULFATE (2.5 MG/3ML) 0.083% IN NEBU: 2.5000 mg | INHALATION_SOLUTION | RESPIRATORY_TRACT | 0 refills | Status: DC | PRN

## 2020-09-27 MED ORDER — TRELEGY ELLIPTA 100-62.5-25 MCG/INH IN AEPB: 1.0000 | INHALATION_SPRAY | Freq: Every day | RESPIRATORY_TRACT | Status: AC

## 2020-09-27 MED ORDER — AMIODARONE HCL 400 MG PO TABS: 200.0000 mg | ORAL_TABLET | Freq: Two times a day (BID) | ORAL | 0 refills | Status: DC

## 2020-09-27 MED ORDER — WARFARIN SODIUM 10 MG PO TABS: 10.0000 mg | ORAL_TABLET | Freq: Once | ORAL | Status: DC

## 2020-09-27 MED ORDER — WARFARIN SODIUM 6 MG PO TABS
9.0000 mg | ORAL_TABLET | Freq: Once | ORAL | Status: DC
  Filled 2020-09-27: qty 1

## 2020-09-27 NOTE — Progress Notes (Signed)
    Durable Medical Equipment  (From admission, onward)           Start     Ordered   09/27/20 1102  For home use only DME Hospital bed  Once       Question Answer Comment  Length of Need 12 Months   The above medical condition requires: Patient requires the ability to reposition frequently   Bed type Semi-electric      09/27/20 1101

## 2020-09-27 NOTE — Progress Notes (Signed)
[facility] Cardiology    SUBJECTIVE: Patient still feels like he is filled with fluid still has shortness of breath denies any pain.  States still has swelling of his legs and abdomen   Vitals:   09/26/20 2200 09/27/20 0035 09/27/20 0415 09/27/20 0419  BP: (!) 94/57 (!) 98/55 (!) 93/45   Pulse: 82 75 84   Resp: 17 17 18    Temp:  98.6 F (37 C) 98.9 F (37.2 C)   TempSrc:  Oral Oral   SpO2:  98% 95%   Weight:    (!) 180.9 kg  Height:         Intake/Output Summary (Last 24 hours) at 09/27/2020 0801 Last data filed at 09/27/2020 0415 Gross per 24 hour  Intake 600 ml  Output 2525 ml  Net -1925 ml      PHYSICAL EXAM  General: Well developed, well nourished, in no acute distress HEENT:  Normocephalic and atramatic Neck:  No JVD.  Lungs: Clear bilaterally to auscultation and percussion. Heart: HRRR . Normal S1 and S2 without gallops or murmurs.  Abdomen: Bowel sounds are positive, abdomen soft and non-tender  Msk:  Back normal, normal gait. Normal strength and tone for age. Extremities: No clubbing, cyanosis or edema.   Neuro: Alert and oriented X 3. Psych:  Good affect, responds appropriately   LABS: Basic Metabolic Panel: Recent Labs    09/26/20 0722 09/27/20 0542  NA 134* 135  K 4.7 4.5  CL 89* 90*  CO2 37* 36*  GLUCOSE 105* 136*  BUN 12 18  CREATININE 0.79 0.80  CALCIUM 8.8* 8.8*  MG 1.5* 1.8   Liver Function Tests: No results for input(s): AST, ALT, ALKPHOS, BILITOT, PROT, ALBUMIN in the last 72 hours. No results for input(s): LIPASE, AMYLASE in the last 72 hours. CBC: Recent Labs    09/26/20 0722 09/27/20 0542  WBC 4.6 5.0  NEUTROABS 2.7 2.7  HGB 11.5* 10.5*  HCT 36.1* 32.4*  MCV 96.8 96.4  PLT 166 175   Cardiac Enzymes: No results for input(s): CKTOTAL, CKMB, CKMBINDEX, TROPONINI in the last 72 hours. BNP: Invalid input(s): POCBNP D-Dimer: No results for input(s): DDIMER in the last 72 hours. Hemoglobin A1C: No results for input(s): HGBA1C in  the last 72 hours. Fasting Lipid Panel: No results for input(s): CHOL, HDL, LDLCALC, TRIG, CHOLHDL, LDLDIRECT in the last 72 hours. Thyroid Function Tests: No results for input(s): TSH, T4TOTAL, T3FREE, THYROIDAB in the last 72 hours.  Invalid input(s): FREET3 Anemia Panel: No results for input(s): VITAMINB12, FOLATE, FERRITIN, TIBC, IRON, RETICCTPCT in the last 72 hours.  No results found.    TELEMETRY: Atrial fibrillation rate of 55:  ASSESSMENT AND PLAN:  Principal Problem:   Acute on chronic combined systolic and diastolic CHF (congestive heart failure) (HCC) Active Problems:   Lymphedema   Chronic atrial fibrillation (HCC)   Type 2 diabetes mellitus with other specified complication (HCC)   HTN (hypertension)   Morbid obesity with BMI of 60.0-69.9, adult (HCC)   Personal history of DVT (deep vein thrombosis)   Atrial fibrillation with rapid ventricular response (HCC)   Major depressive disorder, recurrent episode, in partial remission (HCC)   Noncompliance   Hypothyroidism   Chronic anticoagulation (warfarin  COUMADIN)   COPD with acute exacerbation (HCC)   Hypokalemia   Chronic respiratory failure (HCC)   Hyponasality   Hypomagnesemia   Hypotension    Plan Continue supportive care on telemetry Anticoagulation for atrial fibrillation Inhalers as necessary for shortness of breath  COPD Recommend weight loss exercise portion control Continue diabetes management and control Continue CPAP for obstructive sleep apnea Coumadin therapy for anticoagulation Agree with blood pressure management and control Support stockings elevation for lower extremity edema Correct electrolytes    Alwyn Pea, MD 09/27/2020 8:01 AM

## 2020-09-27 NOTE — Consult Note (Addendum)
ANTICOAGULATION CONSULT NOTE  Pharmacy Consult for Warfarin Indication:  Afib / Hx VTE  Patient Measurements: Height: 5\' 8"  (172.7 cm) Weight: (!) 180.9 kg (398 lb 13 oz) IBW/kg (Calculated) : 68.4  Labs: Recent Labs    09/25/20 0652 09/25/20 1048 09/26/20 0722 09/27/20 0542  HGB 12.0*  --  11.5* 10.5*  HCT 36.9*  --  36.1* 32.4*  PLT 188  --  166 175  LABPROT  --  15.5* 17.6* 19.1*  INR  --  1.2 1.4* 1.6*  CREATININE 0.75  --  0.79 0.80     Estimated Creatinine Clearance: 157.5 mL/min (by C-G formula based on SCr of 0.8 mg/dL).   Medical History: Past Medical History:  Diagnosis Date   Allergy    Anxiety    Arthritis    Asthma    Brain damage    Chronic pain of both knees 07/13/2018   Clotting disorder (HCC)    COPD (chronic obstructive pulmonary disease) (HCC)    Depression    GERD (gastroesophageal reflux disease)    HFrEF (heart failure with reduced ejection fraction) (HCC)    a. 03/2018 Echo: EF 25-30%, diff HK. Mod LAE.   History of DVT (deep vein thrombosis)    History of pulmonary embolism    a. Chronic coumadin.   Hypertension    MI (myocardial infarction) (HCC)    Morbid obesity (HCC)    Neck pain 07/13/2018   NICM (nonischemic cardiomyopathy) (HCC)    a. s/p Cath x 3 - reportedly nl cors. Last cath 2019 in [place]; b. a. 03/2018 Echo: EF 25-30%, diff HK.   Persistent atrial fibrillation (HCC)    a. 03/2018 s/p DCCV; b. CHA2DS2VASc = 1-->Xarelto (later changed to warfarin); c. 05/2018 recurrent afib-->Amio initiated.   Sleep apnea    Sleep apnea     Medications:  Warfarin 5 mg daily prior to admission  Assessment: Patient is a 60 y/o M with medical history as above and including Afib and history of VTE on warfarin who presented to the ED 8/9 with SOB / diarrhea / feeling unwell. Patient had two separate admission in July where his INR was subtherapeutic on admission; question medication compliance. Pharmacy consulted to assist in management and monitoring  of warfarin while patient is admitted.  H&H with mild anemia (c/w BL), platelets within normal limits.  LFTs: Within normal limits. Albumin 2.8 DDI: Amiodarone  Date INR Plan  8/9 1.2 5 mg  8/10 1.2 7.5 mg  8/11 1.2 7.5 mg  8/12 1.4 7.5 mg   8/13 1.6 9 mg        Goal of Therapy:  INR 2-3  Plan:  INR is subtherapeutic. Will give warfarin 9 mg x 1 tonight (2 x home dose and 20% increase from yesterday's dose). Pt should be a steady-state and INR is not trending up, so will increase dose. If there is a jump in INR > 0.4, decrease dose back to 7.5 mg. Amio started 8/9.  Daily INR ordered. CBC at least every 3 days.    10/9, PharmD Clinical Pharmacist 09/27/2020 9:05 AM

## 2020-09-27 NOTE — Progress Notes (Addendum)
Maren Reamer to be D/C'd Home per MD order.  Discussed prescriptions and follow up appointments with the patient. Prescriptions given to patient, medication list explained in detail. Pt verbalized understanding.  Tele box removed and returned. IV catheter discontinued intact. Site without signs and symptoms of complications. Dressing and pressure applied. Pt denies pain at this time. No complaints noted.  An After Visit Summary was printed and given to the patient. Pt states does not have a ride home, will need taxi boucher, primary RN Melanee Spry aware. Will get in touch with Child psychotherapist.  Rigoberto Noel
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
## 2020-09-27 NOTE — TOC Transition Note (Addendum)
Transition of Care [facility]) - CM/SW Discharge Note   Patient Details  Name: [name] [name] MRN: [id number] Date of Birth: 07-21-1960  Transition of Care [facility]) CM/SW Contact:  Gildardo Griffes, LCSW Phone Number: 09/27/2020, 12:33 PM   Clinical Narrative:     Patient will discharge home today, was previously active with [facility], Barbara Cower informed of discharge today. Zack with [employer] updated with hospital bed orders and will work on delivering bed to patient's home per his request. Patient has been provided taxi voucher as he reports he has no one to take him home. Diona Fanti with APS has been updated on patient's discharge home today.   No other discharge needs identified at this time.    Final next level of care: Home w Home Health Services Barriers to Discharge: No Barriers Identified   Patient Goals and CMS Choice Patient states their goals for this hospitalization and ongoing recovery are:: to go home CMS Medicare.gov Compare Post Acute Care list provided to:: Patient Choice offered to / list presented to : Patient  Discharge Placement                       Discharge Plan and Services In-house Referral: Clinical Social Work   Post Acute Care Choice: NA          DME Arranged: Hospital bed DME Agency: [facility] Date DME Agency Contacted: 09/27/20 Time DME Agency Contacted: 1232 Representative spoke with at DME Agency: Zack HH Arranged: PT HH Agency: [facility] Date HH Agency Contacted: 09/27/20 Time HH Agency Contacted: 1232 Representative spoke with at [facility] Agency: Barbara Cower  Social Determinants of Health (SDOH) Interventions     Readmission Risk Interventions Readmission Risk Prevention Plan 01/11/2020 12/24/2019 01/25/2019  Transportation Screening Complete Complete Complete  PCP or Specialist Appt within 3-5 Days - - -  HRI or Home Care Consult - - -  Social Work Consult for Recovery Care Planning/Counseling - - -  Palliative Care  Screening - - -  Medication Review Oceanographer) Complete Complete Complete  PCP or Specialist appointment within 3-5 days of discharge Complete Complete Complete  HRI or Home Care Consult - - Complete  SW Recovery Care/Counseling Consult Complete Complete Complete  Palliative Care Screening Complete Not Applicable Not Applicable  Skilled Nursing Facility Not Applicable Not Applicable Not Applicable

## 2020-09-28 DIAGNOSIS — I509 Heart failure, unspecified: Secondary | ICD-10-CM | POA: Diagnosis not present

## 2020-09-28 LAB — CULTURE, BLOOD (SINGLE)
Culture: NO GROWTH
Special Requests: ADEQUATE

## 2020-09-29 ENCOUNTER — Inpatient Hospital Stay: Payer: Medicaid Other | Admitting: Family Medicine

## 2020-09-29 DIAGNOSIS — I509 Heart failure, unspecified: Secondary | ICD-10-CM | POA: Diagnosis not present

## 2020-09-30 DIAGNOSIS — I509 Heart failure, unspecified: Secondary | ICD-10-CM | POA: Diagnosis not present

## 2020-10-01 DIAGNOSIS — I509 Heart failure, unspecified: Secondary | ICD-10-CM | POA: Diagnosis not present

## 2020-10-02 DIAGNOSIS — I509 Heart failure, unspecified: Secondary | ICD-10-CM | POA: Diagnosis not present

## 2020-10-03 DIAGNOSIS — I509 Heart failure, unspecified: Secondary | ICD-10-CM | POA: Diagnosis not present

## 2020-10-04 DIAGNOSIS — G4733 Obstructive sleep apnea (adult) (pediatric): Secondary | ICD-10-CM | POA: Diagnosis not present

## 2020-10-04 DIAGNOSIS — I509 Heart failure, unspecified: Secondary | ICD-10-CM | POA: Diagnosis not present

## 2020-10-05 DIAGNOSIS — I509 Heart failure, unspecified: Secondary | ICD-10-CM | POA: Diagnosis not present

## 2020-10-06 ENCOUNTER — Telehealth (HOSPITAL_COMMUNITY): Payer: Self-pay

## 2020-10-06 ENCOUNTER — Ambulatory Visit: Payer: Medicaid Other | Admitting: Medical

## 2020-10-06 ENCOUNTER — Other Ambulatory Visit (HOSPITAL_COMMUNITY): Payer: Self-pay

## 2020-10-06 ENCOUNTER — Other Ambulatory Visit: Payer: Self-pay | Admitting: *Deleted

## 2020-10-06 ENCOUNTER — Other Ambulatory Visit: Payer: Self-pay

## 2020-10-06 DIAGNOSIS — I509 Heart failure, unspecified: Secondary | ICD-10-CM | POA: Diagnosis not present
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
## 2020-10-06 NOTE — Patient Instructions (Signed)
Thank you for taking time to speak with me today about care coordination and care management services available to you at no cost as part of your Medicaid benefit. These services are voluntary. Our team is available to provide assistance regarding your health care needs at any time. Please do not hesitate to reach out to me if we can be of service to you at any time in the future.   Hyatt Capobianco RN, BSN [place]  [facility] RN Care Coordinator [phone] 

## 2020-10-06 NOTE — Patient Outreach (Signed)
Care Coordination  10/06/2020  [name] [name] August 15, 1960 [id number]  [name] [name] was referred to the [facility] Managed Care High Risk team for assistance with care coordination and care management services. Care coordination/care management services as part of the Medicaid benefit was offered to the patient today. The patient declined assistance offered today.   Patient questioned what this call was for. RNCM explained that as a free benefit of his insurance the MM Care Team was available to make sure he was getting everything he needed to manage his healthcare. Examples provided(transportation, medications, appointments, education). [name] [name] stated that he takes care of all of those things himself. "The only way you can help me is if you can give me a million dollars." RNCM explained that I could offer assistance in other ways. [name] [name] continued to decline services.   Plan: The Medicaid Managed Care High Risk team is available at any time in the future to assist with care coordination/care management services upon referral.   [name] Emms RN, BSN [place]  [facility] RN Care Coordinator

## 2020-10-06 NOTE — Progress Notes (Signed)
Ken contacted me back and was very hesitant about me coming out to visit but finally agreed to it.  When arrived he is sitting in his recliner with no clothes on lower half.  He is using a urinal and he states he can get up and ambulate with walker.  He appears very weak.  He states in a lot of pain all the time, he states very tired of it.  He states his aid has quit and they are looking for him a new one.  He refusing home health and PT.  He states he needs surgery and that are refusing to put him to sleep. Asked him about going to his PCP and he states electric chair is broke, he can not go to it.  Advised we could get ambulance to take him, all he has to do is ride.  He states dont feel like going.  He states will be better in a couple of days.  Will check again in a couple of days with him.  Will contact his PCP for support.  He did call 911 last night and they had to help him to get in his bed.  He also refused to go to ED AMA.  Will contact social worker involved with his case.  Will visit for heart failure, diet and medication compliance.  He has plenty of foods, canned foods high in sodium.  He states not drinking any alcohol because he is broke.  He states has meds but would not show them to me.  Will contact pharmacy.   Brittiney Dicostanzo [place] EMT-Paramedic [phone] 

## 2020-10-06 NOTE — Telephone Encounter (Signed)
Attempted to contact, no answer.   Left message.    Analeia Ismael [place] EMT-Paramedic [phone] 

## 2020-10-07 DIAGNOSIS — I509 Heart failure, unspecified: Secondary | ICD-10-CM | POA: Diagnosis not present

## 2020-10-08 ENCOUNTER — Observation Stay: Payer: Medicaid Other

## 2020-10-08 ENCOUNTER — Emergency Department: Payer: Medicaid Other

## 2020-10-08 ENCOUNTER — Inpatient Hospital Stay
Admission: EM | Admit: 2020-10-08 | Discharge: 2020-10-12 | DRG: 191 | Disposition: A | Discharge status: Home / Self Care | Payer: Medicaid Other | Attending: Internal Medicine | Admitting: Internal Medicine

## 2020-10-08 ENCOUNTER — Encounter: Payer: Self-pay | Admitting: Emergency Medicine

## 2020-10-08 ENCOUNTER — Other Ambulatory Visit: Payer: Self-pay

## 2020-10-08 DIAGNOSIS — R0689 Other abnormalities of breathing: Secondary | ICD-10-CM | POA: Diagnosis not present

## 2020-10-08 DIAGNOSIS — Z7982 Long term (current) use of aspirin: Secondary | ICD-10-CM

## 2020-10-08 DIAGNOSIS — I10 Essential (primary) hypertension: Secondary | ICD-10-CM | POA: Diagnosis not present

## 2020-10-08 DIAGNOSIS — Z9981 Dependence on supplemental oxygen: Secondary | ICD-10-CM

## 2020-10-08 DIAGNOSIS — E785 Hyperlipidemia, unspecified: Secondary | ICD-10-CM | POA: Diagnosis present

## 2020-10-08 DIAGNOSIS — I509 Heart failure, unspecified: Secondary | ICD-10-CM

## 2020-10-08 DIAGNOSIS — J432 Centrilobular emphysema: Principal | ICD-10-CM | POA: Diagnosis present

## 2020-10-08 DIAGNOSIS — F1029 Alcohol dependence with unspecified alcohol-induced disorder: Secondary | ICD-10-CM | POA: Diagnosis present

## 2020-10-08 DIAGNOSIS — Z9114 Patient's other noncompliance with medication regimen: Secondary | ICD-10-CM

## 2020-10-08 DIAGNOSIS — I5022 Chronic systolic (congestive) heart failure: Secondary | ICD-10-CM | POA: Diagnosis present

## 2020-10-08 DIAGNOSIS — Z7951 Long term (current) use of inhaled steroids: Secondary | ICD-10-CM

## 2020-10-08 DIAGNOSIS — I480 Paroxysmal atrial fibrillation: Secondary | ICD-10-CM | POA: Diagnosis present

## 2020-10-08 DIAGNOSIS — I428 Other cardiomyopathies: Secondary | ICD-10-CM | POA: Diagnosis present

## 2020-10-08 DIAGNOSIS — I2510 Atherosclerotic heart disease of native coronary artery without angina pectoris: Secondary | ICD-10-CM | POA: Diagnosis present

## 2020-10-08 DIAGNOSIS — L98491 Non-pressure chronic ulcer of skin of other sites limited to breakdown of skin: Secondary | ICD-10-CM

## 2020-10-08 DIAGNOSIS — Z79899 Other long term (current) drug therapy: Secondary | ICD-10-CM

## 2020-10-08 DIAGNOSIS — I110 Hypertensive heart disease with heart failure: Secondary | ICD-10-CM | POA: Diagnosis present

## 2020-10-08 DIAGNOSIS — J9610 Chronic respiratory failure, unspecified whether with hypoxia or hypercapnia: Secondary | ICD-10-CM | POA: Diagnosis present

## 2020-10-08 DIAGNOSIS — I5043 Acute on chronic combined systolic (congestive) and diastolic (congestive) heart failure: Secondary | ICD-10-CM | POA: Diagnosis present

## 2020-10-08 DIAGNOSIS — K5900 Constipation, unspecified: Secondary | ICD-10-CM | POA: Diagnosis present

## 2020-10-08 DIAGNOSIS — R0902 Hypoxemia: Secondary | ICD-10-CM | POA: Diagnosis not present

## 2020-10-08 DIAGNOSIS — Z28310 Unvaccinated for covid-19: Secondary | ICD-10-CM

## 2020-10-08 DIAGNOSIS — E039 Hypothyroidism, unspecified: Secondary | ICD-10-CM | POA: Diagnosis present

## 2020-10-08 DIAGNOSIS — F419 Anxiety disorder, unspecified: Secondary | ICD-10-CM | POA: Diagnosis present

## 2020-10-08 DIAGNOSIS — E872 Acidosis: Secondary | ICD-10-CM | POA: Diagnosis present

## 2020-10-08 DIAGNOSIS — Z86711 Personal history of pulmonary embolism: Secondary | ICD-10-CM | POA: Diagnosis present

## 2020-10-08 DIAGNOSIS — M109 Gout, unspecified: Secondary | ICD-10-CM | POA: Diagnosis present

## 2020-10-08 DIAGNOSIS — I482 Chronic atrial fibrillation, unspecified: Secondary | ICD-10-CM | POA: Diagnosis present

## 2020-10-08 DIAGNOSIS — I959 Hypotension, unspecified: Secondary | ICD-10-CM | POA: Diagnosis present

## 2020-10-08 DIAGNOSIS — Z8249 Family history of ischemic heart disease and other diseases of the circulatory system: Secondary | ICD-10-CM

## 2020-10-08 DIAGNOSIS — E662 Morbid (severe) obesity with alveolar hypoventilation: Secondary | ICD-10-CM

## 2020-10-08 DIAGNOSIS — G894 Chronic pain syndrome: Secondary | ICD-10-CM | POA: Diagnosis present

## 2020-10-08 DIAGNOSIS — Z86718 Personal history of other venous thrombosis and embolism: Secondary | ICD-10-CM

## 2020-10-08 DIAGNOSIS — Z7901 Long term (current) use of anticoagulants: Secondary | ICD-10-CM

## 2020-10-08 DIAGNOSIS — H9192 Unspecified hearing loss, left ear: Secondary | ICD-10-CM | POA: Diagnosis present

## 2020-10-08 DIAGNOSIS — F32A Depression, unspecified: Secondary | ICD-10-CM | POA: Diagnosis present

## 2020-10-08 DIAGNOSIS — Z6841 Body Mass Index (BMI) 40.0 and over, adult: Secondary | ICD-10-CM

## 2020-10-08 DIAGNOSIS — R609 Edema, unspecified: Secondary | ICD-10-CM | POA: Diagnosis not present

## 2020-10-08 DIAGNOSIS — Z20822 Contact with and (suspected) exposure to covid-19: Secondary | ICD-10-CM | POA: Diagnosis present

## 2020-10-08 DIAGNOSIS — R0602 Shortness of breath: Secondary | ICD-10-CM | POA: Diagnosis not present

## 2020-10-08 DIAGNOSIS — J9611 Chronic respiratory failure with hypoxia: Secondary | ICD-10-CM | POA: Diagnosis present

## 2020-10-08 DIAGNOSIS — E8770 Fluid overload, unspecified: Secondary | ICD-10-CM

## 2020-10-08 DIAGNOSIS — Z79890 Hormone replacement therapy (postmenopausal): Secondary | ICD-10-CM

## 2020-10-08 DIAGNOSIS — J811 Chronic pulmonary edema: Secondary | ICD-10-CM | POA: Diagnosis not present

## 2020-10-08 DIAGNOSIS — I890 Lymphedema, not elsewhere classified: Secondary | ICD-10-CM | POA: Diagnosis present

## 2020-10-08 DIAGNOSIS — J441 Chronic obstructive pulmonary disease with (acute) exacerbation: Secondary | ICD-10-CM | POA: Diagnosis present

## 2020-10-08 DIAGNOSIS — F411 Generalized anxiety disorder: Secondary | ICD-10-CM | POA: Diagnosis present

## 2020-10-08 DIAGNOSIS — I4891 Unspecified atrial fibrillation: Secondary | ICD-10-CM | POA: Diagnosis present

## 2020-10-08 DIAGNOSIS — F1010 Alcohol abuse, uncomplicated: Secondary | ICD-10-CM | POA: Diagnosis present

## 2020-10-08 DIAGNOSIS — Z7401 Bed confinement status: Secondary | ICD-10-CM

## 2020-10-08 LAB — COMPREHENSIVE METABOLIC PANEL
ALT: 15 U/L (ref 0–44)
AST: 20 U/L (ref 15–41)
Albumin: 2.8 g/dL — ABNORMAL LOW (ref 3.5–5.0)
Alkaline Phosphatase: 87 U/L (ref 38–126)
Anion gap: 13 (ref 5–15)
BUN: 11 mg/dL (ref 6–20)
CO2: 29 mmol/L (ref 22–32)
Calcium: 7.8 mg/dL — ABNORMAL LOW (ref 8.9–10.3)
Chloride: 96 mmol/L — ABNORMAL LOW (ref 98–111)
Creatinine, Ser: 0.6 mg/dL — ABNORMAL LOW (ref 0.61–1.24)
GFR, Estimated: >60 mL/min (ref >60)
Glucose, Bld: 138 mg/dL — ABNORMAL HIGH (ref 70–99)
Potassium: 4.3 mmol/L (ref 3.5–5.1)
Sodium: 138 mmol/L (ref 135–145)
Total Bilirubin: 0.5 mg/dL (ref 0.3–1.2)
Total Protein: 6.3 g/dL — ABNORMAL LOW (ref 6.5–8.1)

## 2020-10-08 LAB — URINALYSIS, COMPLETE (UACMP) WITH MICROSCOPIC
Bilirubin Urine: NEGATIVE
Glucose, UA: NEGATIVE mg/dL
Ketones, ur: NEGATIVE mg/dL
Nitrite: NEGATIVE
Protein, ur: 30 mg/dL — AB
Specific Gravity, Urine: 1.019 (ref 1.005–1.030)
pH: 5 (ref 5.0–8.0)

## 2020-10-08 LAB — RESP PANEL BY RT-PCR (FLU A&B, COVID) ARPGX2
Influenza A by PCR: NEGATIVE
Influenza B by PCR: NEGATIVE
SARS Coronavirus 2 by RT PCR: NEGATIVE

## 2020-10-08 LAB — CBC WITH DIFFERENTIAL/PLATELET
Abs Immature Granulocytes: 0.05 10*3/uL (ref 0.00–0.07)
Basophils Absolute: 0.1 10*3/uL (ref 0.0–0.1)
Basophils Relative: 1 %
Eosinophils Absolute: 0.2 10*3/uL (ref 0.0–0.5)
Eosinophils Relative: 2 %
HCT: 33.7 % — ABNORMAL LOW (ref 39.0–52.0)
Hemoglobin: 10.9 g/dL — ABNORMAL LOW (ref 13.0–17.0)
Immature Granulocytes: 1 %
Lymphocytes Relative: 12 %
Lymphs Abs: 0.9 10*3/uL (ref 0.7–4.0)
MCH: 31 pg (ref 26.0–34.0)
MCHC: 32.3 g/dL (ref 30.0–36.0)
MCV: 95.7 fL (ref 80.0–100.0)
Monocytes Absolute: 0.8 10*3/uL (ref 0.1–1.0)
Monocytes Relative: 11 %
Neutro Abs: 5.5 10*3/uL (ref 1.7–7.7)
Neutrophils Relative %: 73 %
Platelets: 266 10*3/uL (ref 150–400)
RBC: 3.52 MIL/uL — ABNORMAL LOW (ref 4.22–5.81)
RDW: 16.2 % — ABNORMAL HIGH (ref 11.5–15.5)
WBC: 7.5 10*3/uL (ref 4.0–10.5)
nRBC: 0 % (ref 0.0–0.2)

## 2020-10-08 LAB — TROPONIN I (HIGH SENSITIVITY)
Troponin I (High Sensitivity): 32 ng/L — ABNORMAL HIGH (ref <18)
Troponin I (High Sensitivity): 33 ng/L — ABNORMAL HIGH (ref <18)

## 2020-10-08 LAB — LIPASE, BLOOD: Lipase: 24 U/L (ref 11–51)

## 2020-10-08 LAB — LACTIC ACID, PLASMA
Lactic Acid, Venous: 3.6 mmol/L (ref 0.5–1.9)
Lactic Acid, Venous: 2.6 mmol/L (ref 0.5–1.9)

## 2020-10-08 LAB — PROTIME-INR
INR: 1.1 (ref 0.8–1.2)
Prothrombin Time: 14.5 seconds (ref 11.4–15.2)

## 2020-10-08 LAB — BRAIN NATRIURETIC PEPTIDE: B Natriuretic Peptide: 171.3 pg/mL — ABNORMAL HIGH (ref 0.0–100.0)

## 2020-10-08 LAB — CK: Total CK: 54 U/L (ref 49–397)

## 2020-10-08 LAB — MAGNESIUM: Magnesium: 1.6 mg/dL — ABNORMAL LOW (ref 1.7–2.4)

## 2020-10-08 LAB — PHOSPHORUS: Phosphorus: 3.2 mg/dL (ref 2.5–4.6)

## 2020-10-08 LAB — PROCALCITONIN: Procalcitonin: <0.1 ng/mL

## 2020-10-08 MED ORDER — TRAZODONE HCL 100 MG PO TABS
100.0000 mg | ORAL_TABLET | Freq: Every day | ORAL | Status: DC
  Administered 2020-10-08 – 2020-10-11 (×3): 100 mg via ORAL
  Filled 2020-10-08 (×4): qty 1

## 2020-10-08 MED ORDER — THIAMINE HCL 100 MG/ML IJ SOLN
100.0000 mg | Freq: Every day | INTRAMUSCULAR | Status: DC
  Administered 2020-10-08: 100 mg via INTRAVENOUS
  Filled 2020-10-08 (×2): qty 2

## 2020-10-08 MED ORDER — UMECLIDINIUM BROMIDE 62.5 MCG/INH IN AEPB
1.0000 | INHALATION_SPRAY | Freq: Every day | RESPIRATORY_TRACT | Status: DC
  Administered 2020-10-10 – 2020-10-12 (×3): 1 via RESPIRATORY_TRACT
  Filled 2020-10-08: qty 7

## 2020-10-08 MED ORDER — ONDANSETRON HCL 4 MG/2ML IJ SOLN
4.0000 mg | Freq: Four times a day (QID) | INTRAMUSCULAR | Status: DC | PRN
  Administered 2020-10-08 – 2020-10-09 (×3): 4 mg via INTRAVENOUS
  Filled 2020-10-08 (×5): qty 2

## 2020-10-08 MED ORDER — SACUBITRIL-VALSARTAN 24-26 MG PO TABS
1.0000 | ORAL_TABLET | Freq: Two times a day (BID) | ORAL | Status: DC
  Administered 2020-10-08 – 2020-10-12 (×8): 1 via ORAL
  Filled 2020-10-08 (×9): qty 1

## 2020-10-08 MED ORDER — FOLIC ACID 1 MG PO TABS
1.0000 mg | ORAL_TABLET | Freq: Every day | ORAL | Status: DC
  Administered 2020-10-08 – 2020-10-12 (×5): 1 mg via ORAL
  Filled 2020-10-08 (×5): qty 1

## 2020-10-08 MED ORDER — SODIUM CHLORIDE 0.9 % IV SOLN
12.5000 mg | Freq: Four times a day (QID) | INTRAVENOUS | Status: AC | PRN
  Administered 2020-10-09 (×2): 12.5 mg via INTRAVENOUS
  Filled 2020-10-08 (×3): qty 0.5

## 2020-10-08 MED ORDER — WARFARIN - PHARMACIST DOSING INPATIENT
Freq: Every day | Status: DC
  Filled 2020-10-08: qty 1

## 2020-10-08 MED ORDER — ADULT MULTIVITAMIN W/MINERALS CH
1.0000 | ORAL_TABLET | Freq: Every day | ORAL | Status: DC
  Administered 2020-10-09 – 2020-10-12 (×4): 1 via ORAL
  Filled 2020-10-08 (×4): qty 1

## 2020-10-08 MED ORDER — ACETAMINOPHEN 325 MG PO TABS: 650.0000 mg | ORAL_TABLET | Freq: Four times a day (QID) | ORAL | Status: AC | PRN

## 2020-10-08 MED ORDER — FUROSEMIDE 10 MG/ML IJ SOLN
80.0000 mg | Freq: Once | INTRAMUSCULAR | Status: AC
  Administered 2020-10-08: 80 mg via INTRAVENOUS
  Filled 2020-10-08: qty 8

## 2020-10-08 MED ORDER — SODIUM CHLORIDE 0.9 % IV SOLN
500.0000 mg | Freq: Once | INTRAVENOUS | Status: AC
  Administered 2020-10-08: 500 mg via INTRAVENOUS
  Filled 2020-10-08: qty 500

## 2020-10-08 MED ORDER — MONTELUKAST SODIUM 10 MG PO TABS
10.0000 mg | ORAL_TABLET | Freq: Every day | ORAL | Status: DC
  Administered 2020-10-08 – 2020-10-11 (×4): 10 mg via ORAL
  Filled 2020-10-08 (×4): qty 1

## 2020-10-08 MED ORDER — IOHEXOL 350 MG/ML SOLN
100.0000 mL | Freq: Once | INTRAVENOUS | Status: AC | PRN
  Administered 2020-10-09: 100 mL via INTRAVENOUS

## 2020-10-08 MED ORDER — PROCHLORPERAZINE EDISYLATE 10 MG/2ML IJ SOLN
10.0000 mg | Freq: Once | INTRAMUSCULAR | Status: AC
  Administered 2020-10-09: 10 mg via INTRAVENOUS
  Filled 2020-10-08: qty 2

## 2020-10-08 MED ORDER — THIAMINE HCL 100 MG PO TABS
100.0000 mg | ORAL_TABLET | Freq: Every day | ORAL | Status: DC
  Administered 2020-10-09 – 2020-10-12 (×4): 100 mg via ORAL
  Filled 2020-10-08 (×3): qty 1

## 2020-10-08 MED ORDER — ATORVASTATIN CALCIUM 20 MG PO TABS
40.0000 mg | ORAL_TABLET | Freq: Every day | ORAL | Status: DC
  Administered 2020-10-08 – 2020-10-11 (×4): 40 mg via ORAL
  Filled 2020-10-08 (×4): qty 2

## 2020-10-08 MED ORDER — LEVOTHYROXINE SODIUM 50 MCG PO TABS
75.0000 ug | ORAL_TABLET | Freq: Every day | ORAL | Status: DC
  Administered 2020-10-09 – 2020-10-12 (×4): 75 ug via ORAL
  Filled 2020-10-08 (×4): qty 1

## 2020-10-08 MED ORDER — NITROGLYCERIN 0.4 MG SL SUBL
0.4000 mg | SUBLINGUAL_TABLET | SUBLINGUAL | Status: DC | PRN
Start: 2020-10-08 — End: 2020-10-12

## 2020-10-08 MED ORDER — CARVEDILOL 6.25 MG PO TABS
6.2500 mg | ORAL_TABLET | Freq: Two times a day (BID) | ORAL | Status: DC
  Administered 2020-10-08 – 2020-10-12 (×7): 6.25 mg via ORAL
  Filled 2020-10-08 (×8): qty 1

## 2020-10-08 MED ORDER — FLUTICASONE-UMECLIDIN-VILANT 100-62.5-25 MCG/INH IN AEPB: 1.0000 | INHALATION_SPRAY | Freq: Every day | RESPIRATORY_TRACT | Status: DC

## 2020-10-08 MED ORDER — AMIODARONE HCL 200 MG PO TABS
200.0000 mg | ORAL_TABLET | Freq: Two times a day (BID) | ORAL | Status: DC
  Administered 2020-10-08 – 2020-10-12 (×8): 200 mg via ORAL
  Filled 2020-10-08 (×9): qty 1

## 2020-10-08 MED ORDER — ONDANSETRON HCL 4 MG PO TABS: 4.0000 mg | ORAL_TABLET | Freq: Four times a day (QID) | ORAL | Status: DC | PRN

## 2020-10-08 MED ORDER — SUMATRIPTAN SUCCINATE 50 MG PO TABS
50.0000 mg | ORAL_TABLET | ORAL | Status: DC | PRN
Start: 2020-10-08 — End: 2020-10-12
  Administered 2020-10-09: 50 mg via ORAL
  Filled 2020-10-08 (×3): qty 1

## 2020-10-08 MED ORDER — WARFARIN SODIUM 6 MG PO TABS
9.0000 mg | ORAL_TABLET | Freq: Once | ORAL | Status: AC
  Administered 2020-10-08: 9 mg via ORAL
  Filled 2020-10-08: qty 1

## 2020-10-08 MED ORDER — DULOXETINE HCL 20 MG PO CPEP
20.0000 mg | ORAL_CAPSULE | Freq: Every day | ORAL | Status: DC
Start: 2020-10-09 — End: 2020-10-12
  Administered 2020-10-09 – 2020-10-12 (×4): 20 mg via ORAL
  Filled 2020-10-08 (×4): qty 1

## 2020-10-08 MED ORDER — ACETAMINOPHEN 650 MG RE SUPP: 650.0000 mg | Freq: Four times a day (QID) | RECTAL | Status: AC | PRN

## 2020-10-08 MED ORDER — LORAZEPAM 1 MG PO TABS
1.0000 mg | ORAL_TABLET | ORAL | Status: AC | PRN
  Administered 2020-10-09: 1 mg via ORAL
  Administered 2020-10-10: 2 mg via ORAL
  Filled 2020-10-08 (×2): qty 2
  Filled 2020-10-08: qty 1

## 2020-10-08 MED ORDER — ALBUTEROL SULFATE (2.5 MG/3ML) 0.083% IN NEBU: 2.5000 mg | INHALATION_SOLUTION | RESPIRATORY_TRACT | Status: DC | PRN

## 2020-10-08 MED ORDER — FLUTICASONE FUROATE-VILANTEROL 100-25 MCG/INH IN AEPB
1.0000 | INHALATION_SPRAY | Freq: Every day | RESPIRATORY_TRACT | Status: DC
  Administered 2020-10-10 – 2020-10-12 (×3): 1 via RESPIRATORY_TRACT
  Filled 2020-10-08: qty 28

## 2020-10-08 MED ORDER — SPIRONOLACTONE 25 MG PO TABS
12.5000 mg | ORAL_TABLET | Freq: Every day | ORAL | Status: DC
  Administered 2020-10-08 – 2020-10-12 (×5): 12.5 mg via ORAL
  Filled 2020-10-08 (×2): qty 0.5
  Filled 2020-10-08: qty 1
  Filled 2020-10-08 (×3): qty 0.5
  Filled 2020-10-08 (×3): qty 1

## 2020-10-08 MED ORDER — SODIUM CHLORIDE 0.9 % IV SOLN
1.0000 g | INTRAVENOUS | Status: DC
  Administered 2020-10-08: 1 g via INTRAVENOUS
  Filled 2020-10-08 (×2): qty 10

## 2020-10-08 MED ORDER — OXYCODONE-ACETAMINOPHEN 7.5-325 MG PO TABS
1.0000 | ORAL_TABLET | Freq: Three times a day (TID) | ORAL | Status: DC | PRN
  Administered 2020-10-08 – 2020-10-12 (×11): 1 via ORAL
  Filled 2020-10-08 (×11): qty 1

## 2020-10-08 MED ORDER — ALLOPURINOL 100 MG PO TABS
100.0000 mg | ORAL_TABLET | Freq: Every day | ORAL | Status: DC
  Administered 2020-10-09 – 2020-10-12 (×4): 100 mg via ORAL
  Filled 2020-10-08 (×4): qty 1

## 2020-10-08 MED ORDER — ONDANSETRON HCL 4 MG/2ML IJ SOLN
4.0000 mg | Freq: Once | INTRAMUSCULAR | Status: AC
  Administered 2020-10-08: 4 mg via INTRAVENOUS
  Filled 2020-10-08: qty 2

## 2020-10-08 MED ORDER — GUAIFENESIN-DM 100-10 MG/5ML PO SYRP: 5.0000 mL | ORAL_SOLUTION | ORAL | Status: DC | PRN

## 2020-10-08 MED ORDER — ASPIRIN EC 81 MG PO TBEC
81.0000 mg | DELAYED_RELEASE_TABLET | Freq: Every day | ORAL | Status: DC
  Administered 2020-10-09 – 2020-10-12 (×4): 81 mg via ORAL
  Filled 2020-10-08 (×4): qty 1

## 2020-10-08 MED ORDER — LORAZEPAM 2 MG/ML IJ SOLN
1.0000 mg | INTRAMUSCULAR | Status: AC | PRN
  Administered 2020-10-08 (×2): 2 mg via INTRAVENOUS
  Administered 2020-10-08: 3 mg via INTRAVENOUS
  Administered 2020-10-10: 1 mg via INTRAVENOUS
  Filled 2020-10-08 (×5): qty 1

## 2020-10-08 NOTE — H&P (Signed)
History and Physical   [name] [name] WNU:[id number] DOB: 03-15-1960 DOA: 10/08/2020  PCP: [name], DO  Patient coming from: Home via EMS  I have personally briefly reviewed patient's old medical records in [facility] EMR.  Chief Concern: Shortness of breath  HPI: [name] [name] is a 60 y.o. male with medical history significant for Morbid obesity, chronic hypoxic respiratory failure on 4 L nasal cannula at baseline, obesity hypoventilation syndrome, hypertension, anxiety, alcohol use disorder, depression, history of left ear deafness at the age of 53, history of nonischemic cardiomyopathy, COPD, heart failure reduced ejection fraction, OSA, persistent/chronic atrial fibrillation, history of DVT and PE, on chronic anticoagulation with warfarin, chronic bilateral lower extremity venous stasis, presents emergency department for chief concerns of shortness of breath and nausea.  EMS found patient in his feces. He endorsed shortness of breath and nausea.  He reports his shortness of breath is new and started at approximately 10 AM on day of admission.  He reports the nausea started at 8 AM on day of admission.  He denies changes to his diet.  He denies sick contact though he does have a roommate now.  He reports the cough is nonproductive.  He reports he did not take any of his a.m. medications prior to ED presentation.  At bedside he is able to tell me his name, current location of hospital, and his age.  He denies chest pain, abdominal pain, diarrhea, dysuria, syncope, loss of consciousness.  He reports he is constipated.  He reports that he last had a bowel movement in the emergency department and states that the stool was hard.  He reports that at home he urinates in a cup due to chronically being bedbound.  When I asked him how much alcohol is he currently drinking he replies, "as much as I can get my hands on".  Social history: Patient lives at home and he  has a roommate now. He is bedbound and disabled.  He denies tobacco use.  He endorses drinking about half a gallon of liquor per day.  He denies recreational drug use.  He reports that his last EtOH was evening of 10/07/2020.  Vaccination history: He is not vaccinated for COVID-19  ROS: Constitutional: no weight change, no fever ENT/Mouth: no sore throat, no rhinorrhea Eyes: no eye pain, no vision changes Cardiovascular: no chest pain, no dyspnea,  no edema, no palpitations Respiratory: no cough, no sputum, no wheezing Gastrointestinal: no nausea, no vomiting, no diarrhea, no constipation Genitourinary: no urinary incontinence, no dysuria, no hematuria Musculoskeletal: no arthralgias, no myalgias Skin: no skin lesions, no pruritus, Neuro: + weakness, no loss of consciousness, no syncope Psych: no anxiety, no depression, + decrease appetite Heme/Lymph: no bruising, no bleeding  ED Course: Discussed with emergency medicine provider, patient requiring hospitalization for shortness of breath with elevated lactic acid.  Vitals in the emergency department was remarkable for temperature 97.8, increased respiration rate of 26, heart rate of 113, initial blood pressure 129/88, SPO2 of 98% on 3 L nasal cannula.  Labs in the emergency department was remarkable for sodium 138, potassium 4.3, chloride 96, bicarb 29, BUN 11, serum creatinine of 0.60, nonfasting blood glucose 138, WBC 7.5, hemoglobin 10.7, platelets 266.  GFR greater than 60, CK 54, high-sensitivity troponin 32.  Lactic acid was 3.6.  COVID PCR was negative.  UA was remarkable for trace leukocytes.  ED provider ordered furosemide 80 mg IV, Zofran 4 mg IV, azithromycin IV, ceftriaxone IV.  Assessment/Plan  Principal Problem:   Shortness of breath Active Problems:   HTN (hypertension)   Chronic pain syndrome   Personal history of DVT (deep vein thrombosis)   History of pulmonary embolism (on Coumadin)   HLD (hyperlipidemia)    CAD (coronary artery disease)   Chronic anticoagulation (warfarin  COUMADIN)   Depression   Alcohol abuse   Atrial fibrillation, chronic (HCC)   Class 3 obesity with alveolar hypoventilation, serious comorbidity, and body mass index (BMI) of 60.0 to 69.9 in adult [facility])   CHF (congestive heart failure) (HCC)   Alcohol dependence with unspecified alcohol-induced disorder (HCC)   Atrial fibrillation (HCC)   Acute on chronic combined systolic and diastolic CHF (congestive heart failure) (HCC)   # Shortness of breath-etiology is multifactorial including morbid obesity with OSA, chronic respiratory failure requiring chronic 4 L nasal cannula, multifocal pneumonia versus COPD exacerbation - Patient meets sepsis criteria with elevated respiration rate and increased heart rate and lactic acidosis however I suspect patient's elevated respiration rate and heart rate is due to chronic respiratory failure and chronic atrial fibrillation in setting of medication noncompliance - We will follow second lactic acid - Possible multifocal pneumonia on chest x-ray - Status post ceftriaxone and azithromycin per EDP - Check procalcitonin and if positive would recommend a.m. team to continue antibiotic coverage for pneumonia - Strict I's and O's - No IVF bolus or infusion at this time as patient looks volume overloaded  # Atrial fibrillation-warfarin per pharmacy, elevated patient did not take any of his medications prior to presentation to the ED # History of PE and DVT-warfarin per pharmacy - Amiodarone 200 mg p.o. twice daily, Coreg 6.25 mg p.o. twice daily resumed - Goal INR 2-3  # History of alcohol abuse-CIWA protocol initiated, folic acid, thiamine, multivitamin  # Hyperlipidemia-atorvastatin 40 mg nightly resumed  # Nonischemic cardiomyopathy - Entresto 24-26 mg p.o. twice daily resumed, Coreg 6.25 mg p.o. twice daily resumed, amiodarone 200 mg p.o. twice daily - Presumed secondary to alcohol  abuse  # Depression/anxiety-Cymbalta 20 mg daily resumed  # Hypothyroid-levothyroxine 75 mcg p.o. daily before breakfast resumed  # COPD-resumed long-acting inhaler with trilogy 1 inhalation daily, albuterol nebulizer every 4 hours as needed for wheezing and shortness of breath  # OSA # Obesity hypoventilation syndrome - BiPAP nightly ordered - Flutter valve, incentive spirometry  # Chronic bilateral lower extremity lymphedema  Chart reviewed.  Multiple hospitalization  Complete echo on 09/23/2020 showed EF of 25 to 30%, left ventricle demonstrates global hypokinesis, internal cavity size mildly dilated.  Left ventricular diastolic parameters were normal.  Right ventricular systolic function is normal.  Right ventricular size is mildly enlarged.  Left atrial and right atrial size mildly dilated.  Hospitalized from 09/23/2020 to 09/27/2020 for acute on chronic combined systolic and diastolic heart failure exacerbation.  Patient initially developed hypotension and was given albumin.  Patient was given inhaled steroids, Spiriva, DuoNebs.  Cardiology was consulted with concerns for cardiogenic shock patient was started on amiodarone infusion and amiodarone was converted to p.o.  Patient was started on Coreg.  DVT prophylaxis: Warfarin per pharmacy Code Status: Limited, he does not want intubation.  He states he does not want chest compressions and medications. Diet: Heart healthy Family Communication: No Disposition Plan: Pending clinical course Consults called: None at this time Admission status: Progressive cardiac, observation, telemetry ordered for 24 hours  Past Medical History:  Diagnosis Date   Allergy    Anxiety    Arthritis  Asthma    Brain damage    Chronic pain of both knees 07/13/2018   Clotting disorder (HCC)    COPD (chronic obstructive pulmonary disease) (HCC)    Depression    GERD (gastroesophageal reflux disease)    HFrEF (heart failure with reduced ejection  fraction) (HCC)    a. 03/2018 Echo: EF 25-30%, diff HK. Mod LAE.   History of DVT (deep vein thrombosis)    History of pulmonary embolism    a. Chronic coumadin.   Hypertension    MI (myocardial infarction) (HCC)    Morbid obesity (HCC)    Neck pain 07/13/2018   NICM (nonischemic cardiomyopathy) (HCC)    a. s/p Cath x 3 - reportedly nl cors. Last cath 2019 in [place]; b. a. 03/2018 Echo: EF 25-30%, diff HK.   Persistent atrial fibrillation (HCC)    a. 03/2018 s/p DCCV; b. CHA2DS2VASc = 1-->Xarelto (later changed to warfarin); c. 05/2018 recurrent afib-->Amio initiated.   Sleep apnea    Sleep apnea    Past Surgical History:  Procedure Laterality Date   CARDIOVERSION N/A 03/24/2018   Procedure: CARDIOVERSION;  Surgeon: Antonieta Iba, MD;  Location: [facility] ORS;  Service: Cardiovascular;  Laterality: N/A;   CARDIOVERSION N/A 08/08/2018   Procedure: CARDIOVERSION;  Surgeon: [name], MD;  Location: [facility] ORS;  Service: Cardiovascular;  Laterality: N/A;   hearnia repair     X 3- total of two surgeries   HERNIA REPAIR     LEG SURGERY     Social History:  reports that he has never smoked. He has never used smokeless tobacco. He reports that he does not currently use alcohol. He reports that he does not use drugs.  No Known Allergies Family History  Problem Relation Age of Onset   Heart failure Mother    Lung cancer Mother    Lung cancer Father    Heart attack Maternal Grandmother    Heart attack Maternal Grandfather    Family history: Family history reviewed and not pertinent  Prior to Admission medications   Medication Sig Start Date End Date Taking? Authorizing Provider  albuterol (PROVENTIL) (2.5 MG/3ML) 0.083% nebulizer solution Take 3 mLs (2.5 mg total) by nebulization every 4 (four) hours as needed for wheezing or shortness of breath. 09/27/20 09/27/21  Marrion Coy, MD  albuterol (VENTOLIN HFA) 108 (90 Base) MCG/ACT inhaler Inhale 2 puffs into the lungs every 4 (four) hours as  needed for wheezing or shortness of breath. 09/12/20 12/11/20  Dahal, Melina Schools, MD  allopurinol (ZYLOPRIM) 100 MG tablet Take 1 tablet (100 mg total) by mouth daily. 09/12/20 12/11/20  Lorin Glass, MD  amiodarone (PACERONE) 400 MG tablet Take 0.5 tablets (200 mg total) by mouth 2 (two) times daily. 09/27/20   Marrion Coy, MD  aspirin EC 81 MG tablet Take 1 tablet (81 mg total) by mouth daily. Swallow whole. 09/12/20 12/11/20  Lorin Glass, MD  atorvastatin (LIPITOR) 40 MG tablet Take 1 tablet (40 mg total) by mouth daily. 09/12/20 12/11/20  Lorin Glass, MD  carvedilol (COREG) 6.25 MG tablet Take 1 tablet (6.25 mg total) by mouth 2 (two) times daily with a meal. 09/12/20 12/11/20  Dahal, Melina Schools, MD  colchicine 0.6 MG tablet Take 1 tablet (0.6 mg total) by mouth daily. 09/12/20 12/11/20  Lorin Glass, MD  diclofenac Sodium (VOLTAREN) 1 % GEL Apply 2-4 g topically daily as needed (pain). 08/14/20   [provider]  DULoxetine (CYMBALTA) 20 MG capsule Take 1 capsule (20 mg total) by  mouth daily. 09/12/20 12/11/20  Lorin Glass, MD  Fluticasone-Umeclidin-Vilant (TRELEGY ELLIPTA) 100-62.5-25 MCG/INH AEPB Inhale 1 Inhaler into the lungs daily. 09/27/20   Marrion Coy, MD  guaiFENesin-dextromethorphan [facility] DM) 100-10 MG/5ML syrup Take 5 mLs by mouth every 4 (four) hours as needed for cough. 09/12/20   Lorin Glass, MD  levothyroxine (SYNTHROID) 75 MCG tablet Take 1 tablet (75 mcg total) by mouth daily before breakfast. 09/12/20 12/11/20  Dahal, Melina Schools, MD  montelukast (SINGULAIR) 10 MG tablet Take 1 tablet (10 mg total) by mouth at bedtime. 09/12/20 12/11/20  Lorin Glass, MD  Multiple Vitamin (MULTIVITAMIN WITH MINERALS) TABS tablet Take 1 tablet by mouth daily.    [provider]  naphazoline-glycerin (CLEAR EYES REDNESS) 0.012-0.2 % SOLN Place 1-2 drops into both eyes 4 (four) times daily as needed for eye irritation. 08/14/19   Alford Highland, MD  nitroGLYCERIN (NITROSTAT) 0.4 MG SL  tablet Place 1 tablet under tongue every 5 minutes as needed for chest pain. (No more than 3 doses within 15 minutes) 03/08/19   Flinchum, [name], FNP  oxyCODONE-acetaminophen (PERCOCET) 7.5-325 MG tablet Take 1 tablet by mouth every 8 (eight) hours as needed for severe pain.    [provider]  sacubitril-valsartan (ENTRESTO) 24-26 MG Take 1 tablet by mouth 2 (two) times daily. 09/12/20 12/11/20  Lorin Glass, MD  spironolactone (ALDACTONE) 25 MG tablet Take 0.5 tablets (12.5 mg total) by mouth daily. 09/13/20 12/12/20  Lorin Glass, MD  SUMAtriptan (IMITREX) 50 MG tablet Take 50 mg by mouth daily as needed for migraine. May repeat in 2 hours if headache persists or recurs.    [provider]  torsemide (DEMADEX) 20 MG tablet Take 2 tablets (40 mg total) by mouth daily. 09/27/20 12/26/20  Marrion Coy, MD  traZODone (DESYREL) 100 MG tablet Take 1 tablet (100 mg total) by mouth at bedtime for 7 days. 09/12/20 09/19/20  Lorin Glass, MD  warfarin (COUMADIN) 5 MG tablet Take 1 tablet (5 mg total) by mouth daily. 09/12/20 12/11/20  Lorin Glass, MD   Physical Exam: Vitals:   10/08/20 1600 10/08/20 1630 10/08/20 1700 10/08/20 1730  BP: 129/88 (!) 151/86 (!) 147/95 (!) 149/98  Pulse: (!) 113 (!) 114 (!) 110 (!) 104  Resp: (!) 26 (!) 21 20 (!) 23  Temp:      TempSrc:      SpO2: 98% 97% 98% 97%  Weight:      Height:       Constitutional: appears older than chronological age, chronically ill, NAD, calm, comfortable Eyes: PERRL, lids and conjunctivae normal ENMT: Mucous membranes are moist. Posterior pharynx clear of any exudate or lesions. Age-appropriate dentition.  Mild hearing loss Neck: normal, supple, no masses, no thyromegaly Respiratory: clear to auscultation bilaterally, no wheezing, no crackles. Normal respiratory effort. No accessory muscle use.  Cardiovascular: Regular rate and rhythm, no murmurs / rubs / gallops.  Bilateral 3+ pitting extremity edema. 2+ pedal pulses.  No carotid bruits.  Abdomen: Morbidly obese abdomen with pannus, no tenderness, no masses palpated, no hepatosplenomegaly. Bowel sounds positive.  GU: Bilateral intertrigo present Musculoskeletal: no clubbing / cyanosis. No joint deformity upper and lower extremities. Good ROM, no contractures, no atrophy. Normal muscle tone.  Skin: no rashes, lesions, ulcers. No induration Neurologic: Sensation intact. Strength 5/5 in all 4.  Psychiatric: Normal judgment and insight. Alert and oriented x 3. Normal mood.   EKG: independently reviewed, showing atrial fibrillation with rate of 121, QTc 484  Chest x-ray on Admission:  I personally reviewed and I agree with radiologist reading as below.  DG Chest Portable 1 View  Result Date: 10/08/2020 CLINICAL DATA:  Shortness of breath and respiratory distress. EXAM: PORTABLE CHEST 1 VIEW COMPARISON:  Radiograph and CT 09/23/2020 FINDINGS: Chronic unchanged cardiomegaly. Hilar prominence related to known central pulmonary vasculature. Diffuse increased density throughout the right hemithorax. No pneumothorax. IMPRESSION: Diffuse increased density throughout the right hemithorax may be asymmetric pulmonary edema, multifocal pneumonia or less likely layering effusion. Cardiomegaly stable. Electronically Signed   By: Narda RutherfordMelanie  [place] M.D.   On: 10/08/2020 16:58    Labs on Admission: I have personally reviewed following labs  CBC: Recent Labs  Lab 10/08/20 1542  WBC 7.5  NEUTROABS 5.5  HGB 10.9*  HCT 33.7*  MCV 95.7  PLT 266   Basic Metabolic Panel: Recent Labs  Lab 10/08/20 1542  NA 138  K 4.3  CL 96*  CO2 29  GLUCOSE 138*  BUN 11  CREATININE 0.60*  CALCIUM 7.8*   GFR: Estimated Creatinine Clearance: 157.5 mL/min (A) (by C-G formula based on SCr of 0.6 mg/dL (L)).  Liver Function Tests: Recent Labs  Lab 10/08/20 1542  AST 20  ALT 15  ALKPHOS 87  BILITOT 0.5  PROT 6.3*  ALBUMIN 2.8*   Recent Labs  Lab 10/08/20 1542  LIPASE 24    Coagulation Profile: Recent Labs  Lab 10/08/20 1549  INR 1.1   Cardiac Enzymes: Recent Labs  Lab 10/08/20 1542  CKTOTAL 54   Urine analysis:    Component Value Date/Time   COLORURINE YELLOW (A) 10/08/2020 1656   APPEARANCEUR HAZY (A) 10/08/2020 1656   APPEARANCEUR Hazy (A) 12/25/2018 1442   LABSPEC 1.019 10/08/2020 1656   PHURINE 5.0 10/08/2020 1656   GLUCOSEU NEGATIVE 10/08/2020 1656   HGBUR SMALL (A) 10/08/2020 1656   BILIRUBINUR NEGATIVE 10/08/2020 1656   BILIRUBINUR Negative 12/25/2018 1442   KETONESUR NEGATIVE 10/08/2020 1656   PROTEINUR 30 (A) 10/08/2020 1656   NITRITE NEGATIVE 10/08/2020 1656   LEUKOCYTESUR TRACE (A) 10/08/2020 1656   Dr. Sedalia Mutaox [facility]  If 7PM-7AM, please contact overnight-coverage provider If 7AM-7PM, please contact day coverage provider [email or web address]  10/08/2020, 6:39 PM

## 2020-10-08 NOTE — ED Notes (Signed)
Patient removed c-pap and placed on nasal cannula oxygen 4L/min per his request.

## 2020-10-08 NOTE — ED Notes (Signed)
Bedside chest x-ray completed

## 2020-10-08 NOTE — Progress Notes (Signed)
[facility] ED06 Civil engineer, contracting [facility]) Hospital Liaison note:  This patient is currently enrolled in [facility] outpatient-based Palliative Care. Will continue to follow for disposition.  Please call with any outpatient palliative questions or concerns.  Thank you, Abran Cantor, LPN [facility] Liaison [phone]

## 2020-10-08 NOTE — Progress Notes (Addendum)
ANTICOAGULATION CONSULT NOTE  Pharmacy Consult for warfarin Indication: atrial fibrillation  No Known Allergies  Patient Measurements: Height: 5\' 8"  (172.7 cm) Weight: (!) 180.9 kg (398 lb 13 oz) IBW/kg (Calculated) : 68.4  Vital Signs: Temp: 97.8 F (36.6 C) (08/24 1516) Temp Source: Oral (08/24 1516) BP: 149/98 (08/24 1730) Pulse Rate: 104 (08/24 1730)  Labs: Recent Labs    10/08/20 1542 10/08/20 1549  HGB 10.9*  --   HCT 33.7*  --   PLT 266  --   LABPROT  --  14.5  INR  --  1.1  CREATININE 0.60*  --   CKTOTAL 54  --   TROPONINIHS 32*  --     Estimated Creatinine Clearance: 157.5 mL/min (A) (by C-G formula based on SCr of 0.6 mg/dL (L)).   Medical History: Past Medical History:  Diagnosis Date   Allergy    Anxiety    Arthritis    Asthma    Brain damage    Chronic pain of both knees 07/13/2018   Clotting disorder (HCC)    COPD (chronic obstructive pulmonary disease) (HCC)    Depression    GERD (gastroesophageal reflux disease)    HFrEF (heart failure with reduced ejection fraction) (HCC)    a. 03/2018 Echo: EF 25-30%, diff HK. Mod LAE.   History of DVT (deep vein thrombosis)    History of pulmonary embolism    a. Chronic coumadin.   Hypertension    MI (myocardial infarction) (HCC)    Morbid obesity (HCC)    Neck pain 07/13/2018   NICM (nonischemic cardiomyopathy) (HCC)    a. s/p Cath x 3 - reportedly nl cors. Last cath 2019 in [place]; b. a. 03/2018 Echo: EF 25-30%, diff HK.   Persistent atrial fibrillation (HCC)    a. 03/2018 s/p DCCV; b. CHA2DS2VASc = 1-->Xarelto (later changed to warfarin); c. 05/2018 recurrent afib-->Amio initiated.   Sleep apnea    Sleep apnea      Assessment: 60 year old male presented with shortness of breath. Patient with h/o atrial fibrillation and VTE on warfarin. This is the patient's fourth admission in the last two months, each admission his INR has been subtherapeutic around 1.1-1.2 which suggests patient is likely  non-compliant with his warfarin at home. His home dose of warfarin is 5 mg daily although this may not represent a true therapeutic dose given suspected non-compliance. Patient reports last dose of warfarin 8/23. Pharmacy consult for warfarin management.  DDI: amiodarone (continued from home)  Date INR Warfarin Dose 8/24 1.1 9 mg  Goal of Therapy:  INR 2-3 Monitor platelets by anticoagulation protocol: Yes   Plan:  INR subtherapeutic on admission. Will give warfarin 9 mg tonight. Increased dose to represent an approximate load and likely need for increased maintenance dose (received 7.5 mg multiple days last admission with relatively flat trend in INR). Received one dose of azithromycin in the ED. No new DDI at this time. INR with morning labs.  9/24, PharmD, BCPS Clinical Pharmacist 10/08/2020 6:42 PM

## 2020-10-08 NOTE — ED Notes (Addendum)
Placed on c-pap by RT

## 2020-10-08 NOTE — ED Notes (Signed)
IV team at bedside 

## 2020-10-08 NOTE — Progress Notes (Signed)
Late entry: Called to patient's room secondary to severe drop in saturation after possible aspiration. Attempted cool high flow but placed patient on bipap. Tolerating well at this time. No further changes noted.

## 2020-10-08 NOTE — ED Notes (Signed)
Lab called and report adding on mag, phos, and procal to previous lab draw.
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
## 2020-10-08 NOTE — ED Triage Notes (Signed)
Per EMS patient coming from home c/o shortness of breath and nausea. Patient was 88% on arrival- placed on 10L [place] and sats improved to 99%. Patient coughing up mucus on arrival. Bilateral lower leg swelling. EMS report alcohol at bedside of patient on arrival- pt denies daily drinking. MD at bedside.

## 2020-10-08 NOTE — ED Provider Notes (Signed)
11:24 PM  I was called emergently to patient's bedside due to respiratory distress with concerns that he may have aspirated.  Patient in distress, diaphoretic with tachypnea, sats in the low 70s on 15 L nasal cannula.  Large amount of secretions on exam but not actively vomiting.  Rhonchorous breath sounds diffusely.  Chart states that patient is a partial code and does not want intubation.  I was able to confirm this with patient.  Patient given Zofran and placed on BiPAP.  Color improved as did his work of breathing slightly.  EKG, chest x-ray ordered.  Nurse practitioner with hospitalist service arrived to bedside to continue care.   [name], [name], DO 10/08/20 2325

## 2020-10-09 ENCOUNTER — Observation Stay: Payer: Medicaid Other

## 2020-10-09 ENCOUNTER — Encounter: Payer: Self-pay | Admitting: Internal Medicine

## 2020-10-09 DIAGNOSIS — I5022 Chronic systolic (congestive) heart failure: Secondary | ICD-10-CM | POA: Diagnosis present

## 2020-10-09 DIAGNOSIS — R0600 Dyspnea, unspecified: Secondary | ICD-10-CM | POA: Diagnosis not present

## 2020-10-09 DIAGNOSIS — E039 Hypothyroidism, unspecified: Secondary | ICD-10-CM | POA: Diagnosis present

## 2020-10-09 DIAGNOSIS — I509 Heart failure, unspecified: Secondary | ICD-10-CM | POA: Diagnosis not present

## 2020-10-09 DIAGNOSIS — E662 Morbid (severe) obesity with alveolar hypoventilation: Secondary | ICD-10-CM | POA: Diagnosis present

## 2020-10-09 DIAGNOSIS — J441 Chronic obstructive pulmonary disease with (acute) exacerbation: Secondary | ICD-10-CM | POA: Diagnosis not present

## 2020-10-09 DIAGNOSIS — F411 Generalized anxiety disorder: Secondary | ICD-10-CM | POA: Diagnosis present

## 2020-10-09 DIAGNOSIS — I480 Paroxysmal atrial fibrillation: Secondary | ICD-10-CM | POA: Diagnosis present

## 2020-10-09 DIAGNOSIS — Z20822 Contact with and (suspected) exposure to covid-19: Secondary | ICD-10-CM | POA: Diagnosis present

## 2020-10-09 DIAGNOSIS — I110 Hypertensive heart disease with heart failure: Secondary | ICD-10-CM | POA: Diagnosis present

## 2020-10-09 DIAGNOSIS — I482 Chronic atrial fibrillation, unspecified: Secondary | ICD-10-CM

## 2020-10-09 DIAGNOSIS — M109 Gout, unspecified: Secondary | ICD-10-CM | POA: Diagnosis present

## 2020-10-09 DIAGNOSIS — R0902 Hypoxemia: Secondary | ICD-10-CM | POA: Diagnosis not present

## 2020-10-09 DIAGNOSIS — R059 Cough, unspecified: Secondary | ICD-10-CM | POA: Diagnosis not present

## 2020-10-09 DIAGNOSIS — Z6841 Body Mass Index (BMI) 40.0 and over, adult: Secondary | ICD-10-CM | POA: Diagnosis not present

## 2020-10-09 DIAGNOSIS — I428 Other cardiomyopathies: Secondary | ICD-10-CM | POA: Diagnosis present

## 2020-10-09 DIAGNOSIS — Z28310 Unvaccinated for covid-19: Secondary | ICD-10-CM | POA: Diagnosis not present

## 2020-10-09 DIAGNOSIS — E872 Acidosis: Secondary | ICD-10-CM | POA: Diagnosis present

## 2020-10-09 DIAGNOSIS — R0602 Shortness of breath: Secondary | ICD-10-CM | POA: Diagnosis present

## 2020-10-09 DIAGNOSIS — J9811 Atelectasis: Secondary | ICD-10-CM | POA: Diagnosis not present

## 2020-10-09 DIAGNOSIS — E785 Hyperlipidemia, unspecified: Secondary | ICD-10-CM | POA: Diagnosis present

## 2020-10-09 DIAGNOSIS — I959 Hypotension, unspecified: Secondary | ICD-10-CM | POA: Diagnosis present

## 2020-10-09 DIAGNOSIS — I517 Cardiomegaly: Secondary | ICD-10-CM | POA: Diagnosis not present

## 2020-10-09 DIAGNOSIS — F32A Depression, unspecified: Secondary | ICD-10-CM | POA: Diagnosis present

## 2020-10-09 DIAGNOSIS — F1010 Alcohol abuse, uncomplicated: Secondary | ICD-10-CM | POA: Diagnosis present

## 2020-10-09 DIAGNOSIS — G894 Chronic pain syndrome: Secondary | ICD-10-CM | POA: Diagnosis present

## 2020-10-09 DIAGNOSIS — I2699 Other pulmonary embolism without acute cor pulmonale: Secondary | ICD-10-CM | POA: Diagnosis not present

## 2020-10-09 DIAGNOSIS — F1029 Alcohol dependence with unspecified alcohol-induced disorder: Secondary | ICD-10-CM | POA: Diagnosis present

## 2020-10-09 DIAGNOSIS — J432 Centrilobular emphysema: Secondary | ICD-10-CM | POA: Diagnosis present

## 2020-10-09 DIAGNOSIS — I2510 Atherosclerotic heart disease of native coronary artery without angina pectoris: Secondary | ICD-10-CM | POA: Diagnosis present

## 2020-10-09 DIAGNOSIS — J9611 Chronic respiratory failure with hypoxia: Secondary | ICD-10-CM | POA: Diagnosis present

## 2020-10-09 DIAGNOSIS — K5900 Constipation, unspecified: Secondary | ICD-10-CM | POA: Diagnosis present

## 2020-10-09 DIAGNOSIS — J9610 Chronic respiratory failure, unspecified whether with hypoxia or hypercapnia: Secondary | ICD-10-CM | POA: Diagnosis not present

## 2020-10-09 DIAGNOSIS — F419 Anxiety disorder, unspecified: Secondary | ICD-10-CM | POA: Diagnosis present

## 2020-10-09 LAB — BLOOD CULTURE ID PANEL (REFLEXED) - BCID2

## 2020-10-09 LAB — BASIC METABOLIC PANEL
Anion gap: 10 (ref 5–15)
BUN: 9 mg/dL (ref 6–20)
CO2: 32 mmol/L (ref 22–32)
Calcium: 7.9 mg/dL — ABNORMAL LOW (ref 8.9–10.3)
Chloride: 93 mmol/L — ABNORMAL LOW (ref 98–111)
Creatinine, Ser: 0.65 mg/dL (ref 0.61–1.24)
GFR, Estimated: >60 mL/min (ref >60)
Glucose, Bld: 109 mg/dL — ABNORMAL HIGH (ref 70–99)
Potassium: 3.8 mmol/L (ref 3.5–5.1)
Sodium: 135 mmol/L (ref 135–145)

## 2020-10-09 LAB — CBC
HCT: 32 % — ABNORMAL LOW (ref 39.0–52.0)
Hemoglobin: 10.5 g/dL — ABNORMAL LOW (ref 13.0–17.0)
MCH: 31.4 pg (ref 26.0–34.0)
MCHC: 32.8 g/dL (ref 30.0–36.0)
MCV: 95.8 fL (ref 80.0–100.0)
Platelets: 198 10*3/uL (ref 150–400)
RBC: 3.34 MIL/uL — ABNORMAL LOW (ref 4.22–5.81)
RDW: 16.5 % — ABNORMAL HIGH (ref 11.5–15.5)
WBC: 7.6 10*3/uL (ref 4.0–10.5)
nRBC: 0 % (ref 0.0–0.2)

## 2020-10-09 LAB — PROCALCITONIN: Procalcitonin: <0.1 ng/mL

## 2020-10-09 LAB — PROTIME-INR
INR: 1.2 (ref 0.8–1.2)
Prothrombin Time: 15 seconds (ref 11.4–15.2)

## 2020-10-09 MED ORDER — SODIUM CHLORIDE 0.9 % IV SOLN
500.0000 mg | INTRAVENOUS | Status: AC
  Administered 2020-10-09 – 2020-10-10 (×2): 500 mg via INTRAVENOUS
  Filled 2020-10-09 (×3): qty 500

## 2020-10-09 MED ORDER — WARFARIN SODIUM 6 MG PO TABS
9.0000 mg | ORAL_TABLET | Freq: Once | ORAL | Status: AC
  Administered 2020-10-09: 9 mg via ORAL
  Filled 2020-10-09: qty 1

## 2020-10-09 MED ORDER — PREDNISONE 20 MG PO TABS
40.0000 mg | ORAL_TABLET | Freq: Every day | ORAL | Status: DC
  Administered 2020-10-09 – 2020-10-12 (×4): 40 mg via ORAL
  Filled 2020-10-09 (×4): qty 2

## 2020-10-09 NOTE — Progress Notes (Signed)
PROGRESS NOTE    [name] [name]  ZOX:[id number] DOB: [birth year] DOA: 10/08/2020 PCP: [name] [name]   Chief complaint.  Shortness of breath Brief Narrative:  [name] ReamerKenneth Dwayne [name] is a 60 y.o. male with medical history significant for Morbid obesity, chronic hypoxic respiratory failure on 4 L nasal cannula at baseline, obesity hypoventilation syndrome, hypertension, anxiety, alcohol use disorder, depression, history of left ear deafness at the age of 60, history of nonischemic cardiomyopathy, COPD, heart failure reduced ejection fraction, OSA, persistent/chronic atrial fibrillation, history of DVT and PE, on chronic anticoagulation with warfarin, chronic bilateral lower extremity venous stasis, presents emergency department for chief concerns of shortness of breath and nausea. He had a CT angiogram of the chest, did not see large PE, no evidence of pneumonia.  He diagnosed with COPD exacerbation, placed on steroids and Zithromax.   Assessment & Plan:   Principal Problem:   Shortness of breath Active Problems:   HTN (hypertension)   Chronic pain syndrome   Personal history of DVT (deep vein thrombosis)   History of pulmonary embolism (on Coumadin)   HLD (hyperlipidemia)   CAD (coronary artery disease)   Chronic anticoagulation (warfarin  COUMADIN)   Depression   Alcohol abuse   Atrial fibrillation, chronic (HCC)   Class 3 obesity with alveolar hypoventilation, serious comorbidity, and body mass index (BMI) of 60.0 to 69.9 in adult [facility](HCC)   CHF (congestive heart failure) (HCC)   Alcohol dependence with unspecified alcohol-induced disorder (HCC)   Atrial fibrillation (HCC)   Acute on chronic combined systolic and diastolic CHF (congestive heart failure) (HCC)  #1.  COPD exacerbation. Chronic hypoxemic respiratory failure. Obstructive sleep apnea. Obesity hypoventilation syndrome. Reviewed the patient CT scan, no evidence of pneumonia.  No elevation  procalcitonin level, no segment elevation of BNP. Patient condition more consistent with exacerbation of COPD.  He had nausea vomiting, he may have some aspiration from the no vomiting.  But no evidence of bacterial pneumonia. I will continue Zithromax and oral steroids.  Patient probably can be discharged home tomorrow.  2.  Alcohol abuse Patient states that he is no longer drinking alcohol.  But will continue CIWA protocol, continue thiamine folic acid.  3.  Paroxysmal atrial fibrillation. Continue warfarin for  4.  Morbid obesity.    DVT prophylaxis: Warfarin Code Status: Partial Family Communication:  Disposition Plan:    Status is: Observation    Dispo: The patient is from: Home              Anticipated d/c is to: Home              Patient currently is not medically stable to d/c.   Difficult to place patient No        I/O last 3 completed shifts: In: 903.3 [P.O.:740; IV Piggyback:163.3] Out: 1135 [Urine:1135] Total I/O In: 200 [P.O.:200] Out: 400 [Urine:400]     Consultants:  None  Procedures: None  Antimicrobials: Zithromax   Subjective: Patient still complaining short of breath with minimal exertion, no cough.  He did wear his BiPAP last night. No fever chills  No abdominal pain, mild constipation.  No nausea vomiting. No dysuria hematuria.  Objective: Vitals:   10/09/20 0324 10/09/20 0446 10/09/20 0807 10/09/20 1234  BP:  137/72 130/68 105/65  Pulse: (!) 102 94 87 76  Resp:  20    Temp:  100.1 F (37.8 C)  98.8 F (37.1 C)  TempSrc:  Oral  Oral  SpO2: 98% 99% 97%  100%  Weight:      Height:        Intake/Output Summary (Last 24 hours) at 10/09/2020 1259 Last data filed at 10/09/2020 1000 Gross per 24 hour  Intake 1103.33 ml  Output 1535 ml  Net -431.67 ml   Filed Weights   10/08/20 1516  Weight: (!) 180.9 kg    Examination:  General exam: Appears calm and comfortable  Respiratory system: Decreased breathing sounds.  respiratory effort normal. Cardiovascular system: S1 & S2 heard, RRR. No JVD, murmurs, rubs, gallops or clicks. No pedal edema. Gastrointestinal system: Abdomen is nondistended, soft and nontender. No organomegaly or masses felt. Normal bowel sounds heard. Central nervous system: Alert and oriented. No focal neurological deficits. Extremities: Symmetric 5 x 5 power. Skin: No rashes, lesions or ulcers Psychiatry:  Mood & affect appropriate.     Data Reviewed: I have personally reviewed following labs and imaging studies  CBC: Recent Labs  Lab 10/08/20 1542 10/09/20 0708  WBC 7.5 7.6  NEUTROABS 5.5  --   HGB 10.9* 10.5*  HCT 33.7* 32.0*  MCV 95.7 95.8  PLT 266 198   Basic Metabolic Panel: Recent Labs  Lab 10/08/20 1542 10/09/20 0708  NA 138 135  K 4.3 3.8  CL 96* 93*  CO2 29 32  GLUCOSE 138* 109*  BUN 11 9  CREATININE 0.60* 0.65  CALCIUM 7.8* 7.9*  MG 1.6*  --   PHOS 3.2  --    GFR: Estimated Creatinine Clearance: 157.5 mL/min (by C-G formula based on SCr of 0.65 mg/dL). Liver Function Tests: Recent Labs  Lab 10/08/20 1542  AST 20  ALT 15  ALKPHOS 87  BILITOT 0.5  PROT 6.3*  ALBUMIN 2.8*   Recent Labs  Lab 10/08/20 1542  LIPASE 24   No results for input(s): AMMONIA in the last 168 hours. Coagulation Profile: Recent Labs  Lab 10/08/20 1549 10/09/20 0708  INR 1.1 1.2   Cardiac Enzymes: Recent Labs  Lab 10/08/20 1542  CKTOTAL 54   BNP (last 3 results) No results for input(s): PROBNP in the last 8760 hours. HbA1C: No results for input(s): HGBA1C in the last 72 hours. CBG: No results for input(s): GLUCAP in the last 168 hours. Lipid Profile: No results for input(s): CHOL, HDL, LDLCALC, TRIG, CHOLHDL, LDLDIRECT in the last 72 hours. Thyroid Function Tests: No results for input(s): TSH, T4TOTAL, FREET4, T3FREE, THYROIDAB in the last 72 hours. Anemia Panel: No results for input(s): VITAMINB12, FOLATE, FERRITIN, TIBC, IRON, RETICCTPCT in the  last 72 hours. Sepsis Labs: Recent Labs  Lab 10/08/20 1542 10/08/20 1741 10/09/20 0708  PROCALCITON <0.10  --  <0.10  LATICACIDVEN 3.6* 2.6*  --     Recent Results (from the past 240 hour(s))  Blood culture (routine x 2)     Status: None (Preliminary result)   Collection Time: 10/08/20  3:42 PM   Specimen: BLOOD  Result Value Ref Range Status   Specimen Description BLOOD BLOOD RIGHT ARM  Final   Special Requests   Final    BOTTLES DRAWN AEROBIC AND ANAEROBIC Blood Culture adequate volume   Culture   Final    NO GROWTH < 24 HOURS Performed at [facility], [address] [place], [place] [address]    Report Status PENDING  Incomplete  Resp Panel by RT-PCR (Flu A&B, Covid) Nasopharyngeal Swab     Status: None   Collection Time: 10/08/20  3:48 PM   Specimen: Nasopharyngeal Swab; Nasopharyngeal(NP) swabs in vial transport medium  Result Value Ref Range Status   SARS Coronavirus 2 by RT PCR NEGATIVE NEGATIVE Final    Comment: (NOTE) SARS-CoV-2 target nucleic acids are NOT DETECTED.  The SARS-CoV-2 RNA is generally detectable in upper respiratory specimens during the acute phase of infection. The lowest concentration of SARS-CoV-2 viral copies this assay can detect is 138 copies/mL. A negative result does not preclude SARS-Cov-2 infection and should not be used as the sole basis for treatment or other patient management decisions. A negative result may occur with  improper specimen collection/handling, submission of specimen other than nasopharyngeal swab, presence of viral mutation(s) within the areas targeted by this assay, and inadequate number of viral copies(<138 copies/mL). A negative result must be combined with clinical observations, patient history, and epidemiological information. The expected result is Negative.  Fact Sheet for Patients:  BloggerCourse.com  Fact Sheet for Healthcare Providers:   SeriousBroker.it  This test is no t yet approved or cleared by the [place] FDA and  has been authorized for detection and/or diagnosis of SARS-CoV-2 by FDA under an Emergency Use Authorization (EUA). This EUA will remain  in effect (meaning this test can be used) for the duration of the COVID-19 declaration under Section 564(b)(1) of the Act, 21 U.S.C.section 360bbb-3(b)(1), unless the authorization is terminated  or revoked sooner.       Influenza A by PCR NEGATIVE NEGATIVE Final   Influenza B by PCR NEGATIVE NEGATIVE Final    Comment: (NOTE) The Xpert Xpress SARS-CoV-2/FLU/RSV plus assay is intended as an aid in the diagnosis of influenza from Nasopharyngeal swab specimens and should not be used as a sole basis for treatment. Nasal washings and aspirates are unacceptable for Xpert Xpress SARS-CoV-2/FLU/RSV testing.  Fact Sheet for Patients: BloggerCourse.com  Fact Sheet for Healthcare Providers: SeriousBroker.it  This test is not yet approved or cleared by the [place] FDA and has been authorized for detection and/or diagnosis of SARS-CoV-2 by FDA under an Emergency Use Authorization (EUA). This EUA will remain in effect (meaning this test can be used) for the duration of the COVID-19 declaration under Section 564(b)(1) of the Act, 21 U.S.C. section 360bbb-3(b)(1), unless the authorization is terminated or revoked.  Performed at [facility], [address] [place], [place] [address]   Blood culture (routine x 2)     Status: None (Preliminary result)   Collection Time: 10/08/20  5:41 PM   Specimen: BLOOD  Result Value Ref Range Status   Specimen Description BLOOD BLOOD RIGHT FOREARM  Final   Special Requests   Final    BOTTLES DRAWN AEROBIC AND ANAEROBIC Blood Culture adequate volume   Culture   Final    NO GROWTH < 12 HOURS Performed at [facility], [address], [place], [place] [address]    Report Status PENDING  Incomplete         Radiology Studies: CT Angio Chest Pulmonary Embolism (PE) W or WO Contrast  Result Date: 10/09/2020 CLINICAL DATA:  Dyspnea, nausea, hypoxia, productive cough EXAM: CT ANGIOGRAPHY CHEST WITH CONTRAST TECHNIQUE: Multidetector CT imaging of the chest was performed using the standard protocol during bolus administration of intravenous contrast. Multiplanar CT image reconstructions and MIPs were obtained to evaluate the vascular anatomy. CONTRAST:  OMNIPAQUE IOHEXOL 350 MG/ML SOLN COMPARISON:  09/23/2020 FINDINGS: Cardiovascular: Opacification of the pulmonary arterial tree is suboptimal due to bolus timing and respiratory motion. The examination is adequate only for exclusion of large central pulmonary emboli within the main and central  right and left pulmonary arteries. The lobar, segmental, and subsegmental pulmonary arteries are not adequately opacified to definitively exclude the presence of pulmonary embolism. The central pulmonary arteries are mildly enlarged in keeping with changes of pulmonary arterial hypertension. Extensive multi-vessel coronary artery calcification. Mild global cardiomegaly is stable. No pericardial effusion. The thoracic aorta is of normal caliber. Mild atherosclerotic calcification within the aorta. Bovine arch anatomy. Wide patency of the arch vasculature proximally. Mediastinum/Nodes: No enlarged mediastinal, hilar, or axillary lymph nodes. Thyroid gland, trachea, and esophagus demonstrate no significant findings. Lungs/Pleura: Mild left basilar atelectasis. The lungs are otherwise clear. No pneumothorax or pleural effusion. The central airways are widely patent. Upper Abdomen: At least moderate hepatic steatosis. No acute abnormality. Musculoskeletal: Degenerative changes are seen within the thoracic spine. No acute bone abnormality. No lytic or blastic bone lesion identified. Subacute  to remote right seventh rib fracture noted anteriorly. Review of the MIP images confirms the above findings. IMPRESSION: Technically limited examination due to suboptimal bolus timing. No evidence of large central pulmonary embolus involving the main, central right, and central left pulmonary arteries. Extensive coronary artery calcification.  Stable cardiomegaly. Morphologic changes in keeping with pulmonary arterial hypertension. Moderate hepatic steatosis. Aortic Atherosclerosis (ICD10-I70.0). Electronically Signed   By: Helyn Numbers M.D.   On: 10/09/2020 02:00   DG Chest Portable 1 View  Result Date: 10/08/2020 CLINICAL DATA:  Hypoxia EXAM: PORTABLE CHEST 1 VIEW COMPARISON:  10/08/2020 FINDINGS: Shallow inspiration. Cardiac enlargement. Pulmonary vascular congestion with perihilar infiltration or edema. No pleural effusions. No pneumothorax. IMPRESSION: Cardiac enlargement with perihilar infiltration suggesting pneumonia or edema. Similar appearance to previous study. Electronically Signed   By: Burman Nieves M.D.   On: 10/08/2020 23:30   DG Chest Portable 1 View  Result Date: 10/08/2020 CLINICAL DATA:  Shortness of breath and respiratory distress. EXAM: PORTABLE CHEST 1 VIEW COMPARISON:  Radiograph and CT 09/23/2020 FINDINGS: Chronic unchanged cardiomegaly. Hilar prominence related to known central pulmonary vasculature. Diffuse increased density throughout the right hemithorax. No pneumothorax. IMPRESSION: Diffuse increased density throughout the right hemithorax may be asymmetric pulmonary edema, multifocal pneumonia or less likely layering effusion. Cardiomegaly stable. Electronically Signed   By: Narda Rutherford M.D.   On: 10/08/2020 16:58        Scheduled Meds:  allopurinol  100 mg Oral Daily   amiodarone  200 mg Oral BID   aspirin EC  81 mg Oral Daily   atorvastatin  40 mg Oral QHS   carvedilol  6.25 mg Oral BID WC   DULoxetine  20 mg Oral Daily   fluticasone furoate-vilanterol   1 puff Inhalation Daily   And   umeclidinium bromide  1 puff Inhalation Daily   folic acid  1 mg Oral Daily   levothyroxine  75 mcg Oral QAC breakfast   montelukast  10 mg Oral QHS   multivitamin with minerals  1 tablet Oral Daily   sacubitril-valsartan  1 tablet Oral BID   spironolactone  12.5 mg Oral Daily   thiamine  100 mg Oral Daily   Or   thiamine  100 mg Intravenous Daily   traZODone  100 mg Oral QHS   warfarin  9 mg Oral ONCE-1600   Warfarin - Pharmacist Dosing Inpatient   Does not apply q1600   Continuous Infusions:  azithromycin (ZITHROMAX) 500 MG IVPB (Vial-Mate [employer]or)     promethazine (PHENERGAN) injection (IM or IVPB) Stopped (10/09/20 0046)     LOS: 0 days    Time spent: 69  minutes    Marrion Coy, MD [facility]   To contact the attending provider between 7A-7P or the covering provider during after hours 7P-7A, please log into the web site [email or web address] and access using universal [place] password for that web site. If you [name] not have the password, please call the hospital operator.  10/09/2020, 12:59 PM

## 2020-10-09 NOTE — Consult Note (Signed)
WOC Nurse Consult Note: Patient receiving care in [facility]. Reason for Consult: Excoriation to BLE and abdominal folds and pannus.  This patient is well known to the WOC service due to his frequent and numerous admissions. Wound type: venous insufficiency to BLE; MASD-ITD to abdominal/groin folds due to body habitus and obesity Pressure Injury POA: Yes/No/NA Measurement: Wound bed: To be provided by the bedside RN in the flowsheet section  Drainage (amount, consistency, odor)  Periwound: Dressing procedure/placement/frequency:   Wash abdominal and groin folds with soap and water. Thoroughly dry. Use Arneta Cliche [place] [phone]) in the fold areas.  Measure and cut length of InterDry to fit in skin folds that have skin breakdown  Tuck InterDry fabric into skin folds in a single layer, allow for 2 inches of overhang from skin edges to allow for wicking to occur May remove to bathe; dry area thoroughly and then tuck into affected areas again  Do not apply any creams or ointments when using InterDry DO NOT THROW AWAY FOR 5 DAYS unless soiled with stool DO NOT [facility] product, this will inactivate the silver in the material  New sheet of Interdry should be applied after 5 days of use if patient continues to have skin breakdown   Wash legs with soap and water. Pat dry. Apply Sween Moisturizing Ointment to intact skin; place Xeroform gauzes [place] [phone]) over any open or weeping areas. Beginning behind the toes and going to just below the knees, spiral wrap kerlex, then 4 inch ace wrap. Perform daily.  Monitor the wound area(s) for worsening of condition such as: Signs/symptoms of infection,  Increase in size,  Development of or worsening of odor, Development of pain, or increased pain at the affected locations.  Notify the medical team if any of these develop.  Thank you for the consult. WOC nurse will not follow at this time.  Please re-consult the WOC team if needed.  Helmut Muster, RN, MSN, CWOCN,  CNS-BC, pager [phone]

## 2020-10-09 NOTE — Progress Notes (Signed)
ANTICOAGULATION CONSULT NOTE  Pharmacy Consult for warfarin Indication: atrial fibrillation  No Known Allergies  Patient Measurements: Height: 5\' 8"  (172.7 cm) Weight: (!) 180.9 kg (398 lb 13 oz) IBW/kg (Calculated) : 68.4  Vital Signs: Temp: 100.1 F (37.8 C) (08/25 0446) Temp Source: Oral (08/25 0446) BP: 137/72 (08/25 0446) Pulse Rate: 94 (08/25 0446)  Labs: Recent Labs    10/08/20 1542 10/08/20 1549 10/08/20 1741  HGB 10.9*  --   --   HCT 33.7*  --   --   PLT 266  --   --   LABPROT  --  14.5  --   INR  --  1.1  --   CREATININE 0.60*  --   --   CKTOTAL 54  --   --   TROPONINIHS 32*  --  33*     Estimated Creatinine Clearance: 157.5 mL/min (A) (by C-G formula based on SCr of 0.6 mg/dL (L)).   Medical History: Past Medical History:  Diagnosis Date   Allergy    Anxiety    Arthritis    Asthma    Brain damage    Chronic pain of both knees 07/13/2018   Clotting disorder (HCC)    COPD (chronic obstructive pulmonary disease) (HCC)    Depression    GERD (gastroesophageal reflux disease)    HFrEF (heart failure with reduced ejection fraction) (HCC)    a. 03/2018 Echo: EF 25-30%, diff HK. Mod LAE.   History of DVT (deep vein thrombosis)    History of pulmonary embolism    a. Chronic coumadin.   Hypertension    MI (myocardial infarction) (HCC)    Morbid obesity (HCC)    Neck pain 07/13/2018   NICM (nonischemic cardiomyopathy) (HCC)    a. s/p Cath x 3 - reportedly nl cors. Last cath 2019 in [place]; b. a. 03/2018 Echo: EF 25-30%, diff HK.   Persistent atrial fibrillation (HCC)    a. 03/2018 s/p DCCV; b. CHA2DS2VASc = 1-->Xarelto (later changed to warfarin); c. 05/2018 recurrent afib-->Amio initiated.   Sleep apnea    Sleep apnea      Assessment: 60 year old male presented with shortness of breath. Patient with h/o atrial fibrillation and VTE on warfarin. This is the patient's fourth admission in the last two months, each admission his INR has been subtherapeutic  around 1.1-1.2 which suggests patient is likely non-compliant with his warfarin at home. His home dose of warfarin is 5 mg daily although this may not represent a true therapeutic dose given suspected non-compliance. Patient reports last dose of warfarin 8/23. Pharmacy consult for warfarin management.  DDI: amiodarone (continued from home)  Date INR Warfarin Dose 8/24 1.1 9 mg  Goal of Therapy:  INR 2-3 Monitor platelets by anticoagulation protocol: Yes   Plan:  INR subtherapeutic. Will give warfarin 9 mg tonight. Increased dose to represent an approximate load and likely need for increased maintenance dose (received 7.5 mg multiple days last admission with relatively flat trend in INR). Received one dose of azithromycin in the ED. No new DDI at this time. INR with morning labs.  9/24, PharmD Clinical Pharmacist 10/09/2020 7:08 AM

## 2020-10-09 NOTE — Progress Notes (Signed)
PHARMACY - PHYSICIAN COMMUNICATION CRITICAL VALUE ALERT - BLOOD CULTURE IDENTIFICATION (BCID)  [name] [name] is an 60 y.o. male who presented to [facility] on 10/08/2020 with a chief complaint of SOB and nausea.  Assessment:  1/4(aerobic) GPC, Staph Species, Staph Epidermidis, mecA detected  Name of physician (or Provider) Contacted: Mansy,Jan  Current antibiotics: Azithromycin  Changes to prescribed antibiotics recommended:  Patient is on recommended antibiotics - No changes needed. Likely contaminant, will continue to monitor.  Results for orders placed or performed during the hospital encounter of 10/08/20  Blood Culture ID Panel (Reflexed) (Collected: 10/08/2020  5:41 PM)  Result Value Ref Range   Enterococcus faecalis NOT DETECTED NOT DETECTED   Enterococcus Faecium NOT DETECTED NOT DETECTED   Listeria monocytogenes NOT DETECTED NOT DETECTED   Staphylococcus species DETECTED (A) NOT DETECTED   Staphylococcus aureus (BCID) NOT DETECTED NOT DETECTED   Staphylococcus epidermidis DETECTED (A) NOT DETECTED   Staphylococcus lugdunensis NOT DETECTED NOT DETECTED   Streptococcus species NOT DETECTED NOT DETECTED   Streptococcus agalactiae NOT DETECTED NOT DETECTED   Streptococcus pneumoniae NOT DETECTED NOT DETECTED   Streptococcus pyogenes NOT DETECTED NOT DETECTED   A.calcoaceticus-baumannii NOT DETECTED NOT DETECTED   Bacteroides fragilis NOT DETECTED NOT DETECTED   Enterobacterales NOT DETECTED NOT DETECTED   Enterobacter cloacae complex NOT DETECTED NOT DETECTED   Escherichia coli NOT DETECTED NOT DETECTED   Klebsiella aerogenes NOT DETECTED NOT DETECTED   Klebsiella oxytoca NOT DETECTED NOT DETECTED   Klebsiella pneumoniae NOT DETECTED NOT DETECTED   Proteus species NOT DETECTED NOT DETECTED   Salmonella species NOT DETECTED NOT DETECTED   Serratia marcescens NOT DETECTED NOT DETECTED   Haemophilus influenzae NOT DETECTED NOT DETECTED   Neisseria meningitidis NOT  DETECTED NOT DETECTED   Pseudomonas aeruginosa NOT DETECTED NOT DETECTED   Stenotrophomonas maltophilia NOT DETECTED NOT DETECTED   Candida albicans NOT DETECTED NOT DETECTED   Candida auris NOT DETECTED NOT DETECTED   Candida glabrata NOT DETECTED NOT DETECTED   Candida krusei NOT DETECTED NOT DETECTED   Candida parapsilosis NOT DETECTED NOT DETECTED   Candida tropicalis NOT DETECTED NOT DETECTED   Cryptococcus neoformans/gattii NOT DETECTED NOT DETECTED   Methicillin resistance mecA/C DETECTED (A) NOT DETECTED    [name] [name] A [name] 10/09/2020  10:58 PM

## 2020-10-10 DIAGNOSIS — I5022 Chronic systolic (congestive) heart failure: Secondary | ICD-10-CM

## 2020-10-10 DIAGNOSIS — I509 Heart failure, unspecified: Secondary | ICD-10-CM | POA: Diagnosis not present

## 2020-10-10 DIAGNOSIS — J9610 Chronic respiratory failure, unspecified whether with hypoxia or hypercapnia: Secondary | ICD-10-CM

## 2020-10-10 LAB — CBC
HCT: 36.6 % — ABNORMAL LOW (ref 39.0–52.0)
Hemoglobin: 11.5 g/dL — ABNORMAL LOW (ref 13.0–17.0)
MCH: 30.1 pg (ref 26.0–34.0)
MCHC: 31.4 g/dL (ref 30.0–36.0)
MCV: 95.8 fL (ref 80.0–100.0)
Platelets: 220 10*3/uL (ref 150–400)
RBC: 3.82 MIL/uL — ABNORMAL LOW (ref 4.22–5.81)
RDW: 15.9 % — ABNORMAL HIGH (ref 11.5–15.5)
WBC: 4.8 10*3/uL (ref 4.0–10.5)
nRBC: 0 % (ref 0.0–0.2)

## 2020-10-10 LAB — BASIC METABOLIC PANEL
Anion gap: 5 (ref 5–15)
BUN: 10 mg/dL (ref 6–20)
CO2: 37 mmol/L — ABNORMAL HIGH (ref 22–32)
Calcium: 8.3 mg/dL — ABNORMAL LOW (ref 8.9–10.3)
Chloride: 92 mmol/L — ABNORMAL LOW (ref 98–111)
Creatinine, Ser: 0.72 mg/dL (ref 0.61–1.24)
GFR, Estimated: >60 mL/min (ref >60)
Glucose, Bld: 171 mg/dL — ABNORMAL HIGH (ref 70–99)
Potassium: 4.5 mmol/L (ref 3.5–5.1)
Sodium: 134 mmol/L — ABNORMAL LOW (ref 135–145)

## 2020-10-10 LAB — PROTIME-INR
INR: 1.3 — ABNORMAL HIGH (ref 0.8–1.2)
Prothrombin Time: 15.8 seconds — ABNORMAL HIGH (ref 11.4–15.2)

## 2020-10-10 LAB — MAGNESIUM: Magnesium: 1.4 mg/dL — ABNORMAL LOW (ref 1.7–2.4)

## 2020-10-10 LAB — PROCALCITONIN: Procalcitonin: 2.02 ng/mL

## 2020-10-10 MED ORDER — WARFARIN SODIUM 7.5 MG PO TABS
7.5000 mg | ORAL_TABLET | Freq: Once | ORAL | Status: AC
  Administered 2020-10-10: 7.5 mg via ORAL
  Filled 2020-10-10: qty 1

## 2020-10-10 MED ORDER — SODIUM CHLORIDE 0.9 % IV SOLN
1.0000 g | INTRAVENOUS | Status: DC
  Administered 2020-10-10: 1 g via INTRAVENOUS
  Filled 2020-10-10 (×2): qty 10

## 2020-10-10 MED ORDER — AMOXICILLIN-POT CLAVULANATE 875-125 MG PO TABS
1.0000 | ORAL_TABLET | Freq: Two times a day (BID) | ORAL | Status: DC
  Administered 2020-10-10 – 2020-10-11 (×3): 1 via ORAL
  Filled 2020-10-10 (×3): qty 1

## 2020-10-10 MED ORDER — MAGNESIUM SULFATE 50 % IJ SOLN
3.0000 g | Freq: Once | INTRAVENOUS | Status: AC
  Administered 2020-10-10: 3 g via INTRAVENOUS
  Filled 2020-10-10: qty 6
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
## 2020-10-10 NOTE — Progress Notes (Signed)
ANTICOAGULATION CONSULT NOTE  Pharmacy Consult for warfarin Indication: atrial fibrillation  No Known Allergies  Patient Measurements: Height: 5\' 8"  (172.7 cm) Weight: (!) 186.2 kg (410 lb 7.9 oz) IBW/kg (Calculated) : 68.4  Vital Signs: Temp: 97.4 F (36.3 C) (08/26 0021) Temp Source: Oral (08/26 0021) BP: 138/70 (08/26 0452) Pulse Rate: 79 (08/26 0452)  Labs: Recent Labs    10/08/20 1542 10/08/20 1549 10/08/20 1741 10/09/20 0708 10/10/20 0555  HGB 10.9*  --   --  10.5* 11.5*  HCT 33.7*  --   --  32.0* 36.6*  PLT 266  --   --  198 220  LABPROT  --  14.5  --  15.0 15.8*  INR  --  1.1  --  1.2 1.3*  CREATININE 0.60*  --   --  0.65 0.72  CKTOTAL 54  --   --   --   --   TROPONINIHS 32*  --  33*  --   --      Estimated Creatinine Clearance: 160.4 mL/min (by C-G formula based on SCr of 0.72 mg/dL).   Medical History: Past Medical History:  Diagnosis Date   Allergy    Anxiety    Arthritis    Asthma    Brain damage    Chronic pain of both knees 07/13/2018   Clotting disorder (HCC)    COPD (chronic obstructive pulmonary disease) (HCC)    Depression    GERD (gastroesophageal reflux disease)    HFrEF (heart failure with reduced ejection fraction) (HCC)    a. 03/2018 Echo: EF 25-30%, diff HK. Mod LAE.   History of DVT (deep vein thrombosis)    History of pulmonary embolism    a. Chronic coumadin.   Hypertension    MI (myocardial infarction) (HCC)    Morbid obesity (HCC)    Neck pain 07/13/2018   NICM (nonischemic cardiomyopathy) (HCC)    a. s/p Cath x 3 - reportedly nl cors. Last cath 2019 in [place]; b. a. 03/2018 Echo: EF 25-30%, diff HK.   Persistent atrial fibrillation (HCC)    a. 03/2018 s/p DCCV; b. CHA2DS2VASc = 1-->Xarelto (later changed to warfarin); c. 05/2018 recurrent afib-->Amio initiated.   Sleep apnea    Sleep apnea      Assessment: 60 year old male presented with shortness of breath. Patient with h/o atrial fibrillation and VTE on warfarin. This is  the patient's fourth admission in the last two months, each admission his INR has been subtherapeutic around 1.1-1.2 which suggests patient is likely non-compliant with his warfarin at home. His home dose of warfarin is 5 mg daily although this may not represent a true therapeutic dose given suspected non-compliance. Patient reports last dose of warfarin 8/23. Pharmacy consult for warfarin management.  DDI: amiodarone (continued from home)  Date INR Warfarin Dose 8/24 1.1 9 mg 8/25 1.2 9 mg 8/26 1.3   Goal of Therapy:  INR 2-3 Monitor platelets by anticoagulation protocol: Yes   Plan:  INR subtherapeutic, have not yet seen the full effects of the prior doses. Will give warfarin 7.5 mg tonight and anticipate INR to trend up tomorrow. Receiving 3 days of azithromycin. No new DDI at this time. INR with morning labs.  9/26, PharmD Clinical Pharmacist 10/10/2020 7:26 AM

## 2020-10-10 NOTE — Plan of Care (Signed)
  Problem: Clinical Measurements: Goal: Ability to maintain clinical measurements within normal limits will improve Outcome: Progressing   Problem: Clinical Measurements: Goal: Will remain free from infection Outcome: Progressing   Problem: Clinical Measurements: Goal: Diagnostic test results will improve Outcome: Progressing   

## 2020-10-10 NOTE — Progress Notes (Signed)
PROGRESS NOTE    [name] [name]  XYI:[id number] DOB: [birth year] DOA: 10/08/2020 PCP: [name], DO   Chief complaint.  Shortness of breath. Brief Narrative:  [name] [name] is a 60 y.o. 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 He had a CT angiogram of the chest, did not see large PE, no evidence of pneumonia.  He diagnosed with COPD exacerbation, placed on steroids and Zithromax.   Assessment & Plan:   Principal Problem:   Shortness of breath Active Problems:   HTN (hypertension)   Chronic pain syndrome   Personal history of DVT (deep vein thrombosis)   History of pulmonary embolism (on Coumadin)   HLD (hyperlipidemia)   CAD (coronary artery disease)   Chronic anticoagulation (warfarin  COUMADIN)   Depression   Alcohol abuse   COPD with acute exacerbation (HCC)   Atrial fibrillation, chronic (HCC)   Class 3 obesity with alveolar hypoventilation, serious comorbidity, and body mass index (BMI) of 60.0 to 69.9 in adult [facility])   CHF (congestive heart failure) (HCC)   Chronic systolic CHF (congestive heart failure) (HCC)   Chronic respiratory failure (HCC)   Alcohol dependence with unspecified alcohol-induced disorder (HCC)   Atrial fibrillation (HCC)   COPD exacerbation (HCC)  COPD exacerbation. Chronic hypoxemic respiratory failure. Obstructive sleep apnea. Obesity hypoventilation syndrome. Patient still  has significant short of breath.  Oxygenation stable. Continue steroids. Recheck a procalcitonin level was elevated now, although chest x-ray did not show any focal filtrates or consolidation.  I suspect patient may have aspiration as patient is alcohol drinker. I will start antibiotics with Augmentin for now.  Also continue Zithromax.  Alcohol abuse Patient states that that he is no longer drinking alcohol.  I suspect that he is still drinking.  Therefore, I will continue CIWA protocol, thiamine and folic acid.  Paroxysmal atrial fibrillation. Continue warfarin   Hypomagnesemia. Repleted.   Morbid obesity.  DVT prophylaxis: Warfarin Code Status: Partial Family Communication:  Disposition Plan:    Status is: Inpatient  Remains inpatient appropriate because:Inpatient level of care appropriate due to severity of illness  Dispo: The patient is from: Home              Anticipated d/c is to: Home              Patient currently is not medically stable to d/c.   Difficult to place patient No        I/O last 3 completed shifts: In: 2253.3 [P.O.:1940; IV Piggyback:313.3] Out: 1660 [Urine:1660] Total I/O In: 100 [P.O.:100] Out: 300 [Urine:300]     Consultants:  None  Procedures: None  Antimicrobials: Augmentin and Zithromax.  Subjective: Patient still complaining of short of breath with minimal exertion, cough, with some white mucus. No chest pain or palpitation. No abdominal pain or nausea vomiting. No dysuria hematuria. No fever or chills.  Objective: Vitals:   10/10/20 0452 10/10/20 0452 10/10/20 0742 10/10/20 1312  BP:  138/70 110/75 121/61  Pulse:  79 93 77  Resp:   18 16  Temp:   98.3  F (36.8 C) 98.8 F (37.1 C)  TempSrc:   Oral   SpO2:  100% 99% 98%  Weight: (!) 186.2 kg     Height:        Intake/Output Summary (Last 24 hours) at 10/10/2020 1531 Last data filed at 10/10/2020 1400 Gross per 24 hour  Intake 950 ml  Output 700 ml  Net 250 ml    Filed Weights   10/08/20 1516 10/10/20 0452  Weight: (!) 180.9 kg (!) 186.2 kg    Examination:  General exam: Appears calm and comfortable  Respiratory system: Decreased breathing sounds without crackles or wheezes. Respiratory effort normal. Cardiovascular system: S1 & S2 heard, RRR. No JVD, murmurs, rubs, gallops or clicks. No pedal edema. Gastrointestinal system: Abdomen is nondistended, soft and nontender. No organomegaly or masses felt. Normal bowel sounds heard. Central nervous system: Alert and oriented. No focal neurological deficits. Extremities: Symmetric 5 x 5 power. Skin: No rashes, lesions or ulcers Psychiatry: Mood & affect appropriate.     Data Reviewed: I have personally reviewed following labs and imaging studies  CBC: Recent Labs  Lab 10/08/20 1542 10/09/20 0708 10/10/20 0555  WBC 7.5 7.6 4.8  NEUTROABS 5.5  --   --   HGB 10.9* 10.5* 11.5*  HCT 33.7* 32.0* 36.6*  MCV 95.7 95.8 95.8  PLT 266 198 220   Basic Metabolic Panel: Recent Labs  Lab 10/08/20 1542 10/09/20 0708 10/10/20 0555  NA 138 135 134*  K 4.3 3.8 4.5  CL 96* 93* 92*  CO2 29 32 37*  GLUCOSE 138* 109* 171*  BUN 11 9 10   CREATININE 0.60* 0.65 0.72  CALCIUM 7.8* 7.9* 8.3*  MG 1.6*  --  1.4*  PHOS 3.2  --   --    GFR: Estimated Creatinine Clearance: 160.4 mL/min (by C-G formula based on SCr of 0.72 mg/dL). Liver Function Tests: Recent Labs  Lab 10/08/20 1542  AST 20  ALT 15  ALKPHOS 87  BILITOT 0.5  PROT 6.3*  ALBUMIN 2.8*   Recent Labs  Lab 10/08/20 1542  LIPASE 24   No results for input(s): AMMONIA in the last 168 hours. Coagulation Profile: Recent Labs  Lab 10/08/20 1549 10/09/20 0708 10/10/20 0555  INR 1.1 1.2 1.3*   Cardiac Enzymes: Recent Labs  Lab 10/08/20 1542  CKTOTAL 54   BNP (last 3 results) No results for input(s): PROBNP in the last 8760 hours. HbA1C: No results for input(s): HGBA1C in the last 72 hours. CBG: No results for input(s):  GLUCAP in the last 168 hours. Lipid Profile: No results for input(s): CHOL, HDL, LDLCALC, TRIG, CHOLHDL, LDLDIRECT in the last 72 hours. Thyroid Function Tests: No results for input(s): TSH, T4TOTAL, FREET4, T3FREE, THYROIDAB in the last 72 hours. Anemia Panel: No results for input(s): VITAMINB12, FOLATE, FERRITIN, TIBC, IRON, RETICCTPCT in the last 72 hours. Sepsis Labs: Recent Labs  Lab 10/08/20 1542 10/08/20 1741 10/09/20 0708 10/10/20 0555  PROCALCITON <0.10  --  <0.10 2.02  LATICACIDVEN 3.6* 2.6*  --   --     Recent Results (from the past 240 hour(s))  Blood culture (routine x 2)     Status: None (Preliminary result)   Collection Time: 10/08/20  3:42 PM   Specimen: BLOOD  Result Value Ref Range Status   Specimen Description BLOOD BLOOD RIGHT ARM  Final   Special Requests   Final    BOTTLES DRAWN AEROBIC AND ANAEROBIC Blood Culture adequate volume   Culture   Final  NO GROWTH 2 DAYS Performed at [facility], [address] [place], [place] [address]    Report Status PENDING  Incomplete  Resp Panel by RT-PCR (Flu A&B, Covid) Nasopharyngeal Swab     Status: None   Collection Time: 10/08/20  3:48 PM   Specimen: Nasopharyngeal Swab; Nasopharyngeal(NP) swabs in vial transport medium  Result Value Ref Range Status   SARS Coronavirus 2 by RT PCR NEGATIVE NEGATIVE Final    Comment: (NOTE) SARS-CoV-2 target nucleic acids are NOT DETECTED.  The SARS-CoV-2 RNA is generally detectable in upper respiratory specimens during the acute phase of infection. The lowest concentration of SARS-CoV-2 viral copies this assay can detect is 138 copies/mL. A negative result does not preclude SARS-Cov-2 infection and should not be used as the sole basis for treatment or other patient management decisions. A negative result may occur with  improper specimen collection/handling, submission of specimen other than nasopharyngeal swab, presence of viral mutation(s) within the areas  targeted by this assay, and inadequate number of viral copies(<138 copies/mL). A negative result must be combined with clinical observations, patient history, and epidemiological information. The expected result is Negative.  Fact Sheet for Patients:  BloggerCourse.com  Fact Sheet for Healthcare Providers:  SeriousBroker.it  This test is no t yet approved or cleared by the [place] FDA and  has been authorized for detection and/or diagnosis of SARS-CoV-2 by FDA under an Emergency Use Authorization (EUA). This EUA will remain  in effect (meaning this test can be used) for the duration of the COVID-19 declaration under Section 564(b)(1) of the Act, 21 U.S.C.section 360bbb-3(b)(1), unless the authorization is terminated  or revoked sooner.       Influenza A by PCR NEGATIVE NEGATIVE Final   Influenza B by PCR NEGATIVE NEGATIVE Final    Comment: (NOTE) The Xpert Xpress SARS-CoV-2/FLU/RSV plus assay is intended as an aid in the diagnosis of influenza from Nasopharyngeal swab specimens and should not be used as a sole basis for treatment. Nasal washings and aspirates are unacceptable for Xpert Xpress SARS-CoV-2/FLU/RSV testing.  Fact Sheet for Patients: BloggerCourse.com  Fact Sheet for Healthcare Providers: SeriousBroker.it  This test is not yet approved or cleared by the [place] FDA and has been authorized for detection and/or diagnosis of SARS-CoV-2 by FDA under an Emergency Use Authorization (EUA). This EUA will remain in effect (meaning this test can be used) for the duration of the COVID-19 declaration under Section 564(b)(1) of the Act, 21 U.S.C. section 360bbb-3(b)(1), unless the authorization is terminated or revoked.  Performed at [facility], [address] [place], [place] [address]   Blood culture (routine x 2)     Status: None (Preliminary  result)   Collection Time: 10/08/20  5:41 PM   Specimen: BLOOD  Result Value Ref Range Status   Specimen Description BLOOD BLOOD RIGHT FOREARM  Final   Special Requests   Final    BOTTLES DRAWN AEROBIC AND ANAEROBIC Blood Culture adequate volume   Culture  Setup Time   Final    GRAM POSITIVE COCCI Organism ID to follow CRITICAL RESULT CALLED TO, READ BACK BY AND VERIFIED WITH: Arabella Merles AT 2111 10/09/20 BY [facility]    Culture   Final    NO GROWTH 2 DAYS Performed at [facility], [address], [place], [place] [address]    Report Status PENDING  Incomplete  Blood Culture ID Panel (Reflexed)     Status: Abnormal   Collection Time: 10/08/20  5:41  PM  Result Value Ref Range Status   Enterococcus faecalis NOT DETECTED NOT DETECTED Final   Enterococcus Faecium NOT DETECTED NOT DETECTED Final   Listeria monocytogenes NOT DETECTED NOT DETECTED Final   Staphylococcus species DETECTED (A) NOT DETECTED Final    Comment: WALDY NAZARI AT 2111 10/09/20 BY [facility]   Staphylococcus aureus (BCID) NOT DETECTED NOT DETECTED Final   Staphylococcus epidermidis DETECTED (A) NOT DETECTED Final    Comment: Methicillin (oxacillin) resistant coagulase negative staphylococcus. Possible blood culture contaminant (unless isolated from more than one blood culture draw or clinical case suggests pathogenicity). No antibiotic treatment is indicated for blood  culture contaminants. WALDY NAZARI AT 2111 10/09/20 BY [facility]    Staphylococcus lugdunensis NOT DETECTED NOT DETECTED Final   Streptococcus species NOT DETECTED NOT DETECTED Final   Streptococcus agalactiae NOT DETECTED NOT DETECTED Final   Streptococcus pneumoniae NOT DETECTED NOT DETECTED Final   Streptococcus pyogenes NOT DETECTED NOT DETECTED Final   A.calcoaceticus-baumannii NOT DETECTED NOT DETECTED Final   Bacteroides fragilis NOT DETECTED NOT DETECTED Final   Enterobacterales NOT DETECTED NOT DETECTED Final   Enterobacter cloacae complex NOT  DETECTED NOT DETECTED Final   Escherichia coli NOT DETECTED NOT DETECTED Final   Klebsiella aerogenes NOT DETECTED NOT DETECTED Final   Klebsiella oxytoca NOT DETECTED NOT DETECTED Final   Klebsiella pneumoniae NOT DETECTED NOT DETECTED Final   Proteus species NOT DETECTED NOT DETECTED Final   Salmonella species NOT DETECTED NOT DETECTED Final   Serratia marcescens NOT DETECTED NOT DETECTED Final   Haemophilus influenzae NOT DETECTED NOT DETECTED Final   Neisseria meningitidis NOT DETECTED NOT DETECTED Final   Pseudomonas aeruginosa NOT DETECTED NOT DETECTED Final   Stenotrophomonas maltophilia NOT DETECTED NOT DETECTED Final   Candida albicans NOT DETECTED NOT DETECTED Final   Candida auris NOT DETECTED NOT DETECTED Final   Candida glabrata NOT DETECTED NOT DETECTED Final   Candida krusei NOT DETECTED NOT DETECTED Final   Candida parapsilosis NOT DETECTED NOT DETECTED Final   Candida tropicalis NOT DETECTED NOT DETECTED Final   Cryptococcus neoformans/gattii NOT DETECTED NOT DETECTED Final   Methicillin resistance mecA/C DETECTED (A) NOT DETECTED Final    CommentArabella Merles AT 2111 10/09/20 BY [facility] Performed at [facility], [address] [place], [place] [address]          Radiology Studies: CT Angio Chest Pulmonary Embolism (PE) W or WO Contrast  Result Date: 10/09/2020 CLINICAL DATA:  Dyspnea, nausea, hypoxia, productive cough EXAM: CT ANGIOGRAPHY CHEST WITH CONTRAST TECHNIQUE: Multidetector CT imaging of the chest was performed using the standard protocol during bolus administration of intravenous contrast. Multiplanar CT image reconstructions and MIPs were obtained to evaluate the vascular anatomy. CONTRAST:  OMNIPAQUE IOHEXOL 350 MG/ML SOLN COMPARISON:  09/23/2020 FINDINGS: Cardiovascular: Opacification of the pulmonary arterial tree is suboptimal due to bolus timing and respiratory motion. The examination is adequate only for exclusion of large central  pulmonary emboli within the main and central right and left pulmonary arteries. The lobar, segmental, and subsegmental pulmonary arteries are not adequately opacified to definitively exclude the presence of pulmonary embolism. The central pulmonary arteries are mildly enlarged in keeping with changes of pulmonary arterial hypertension. Extensive multi-vessel coronary artery calcification. Mild global cardiomegaly is stable. No pericardial effusion. The thoracic aorta is of normal caliber. Mild atherosclerotic calcification within the aorta. Bovine arch anatomy. Wide patency of the arch vasculature proximally. Mediastinum/Nodes: No enlarged mediastinal, hilar, or axillary lymph nodes. Thyroid gland, trachea, and  esophagus demonstrate no significant findings. Lungs/Pleura: Mild left basilar atelectasis. The lungs are otherwise clear. No pneumothorax or pleural effusion. The central airways are widely patent. Upper Abdomen: At least moderate hepatic steatosis. No acute abnormality. Musculoskeletal: Degenerative changes are seen within the thoracic spine. No acute bone abnormality. No lytic or blastic bone lesion identified. Subacute to remote right seventh rib fracture noted anteriorly. Review of the MIP images confirms the above findings. IMPRESSION: Technically limited examination due to suboptimal bolus timing. No evidence of large central pulmonary embolus involving the main, central right, and central left pulmonary arteries. Extensive coronary artery calcification.  Stable cardiomegaly. Morphologic changes in keeping with pulmonary arterial hypertension. Moderate hepatic steatosis. Aortic Atherosclerosis (ICD10-I70.0). Electronically Signed   By: Helyn NumbersAshesh  Parikh M.D.   On: 10/09/2020 02:00   DG Chest Portable 1 View  Result Date: 10/08/2020 CLINICAL DATA:  Hypoxia EXAM: PORTABLE CHEST 1 VIEW COMPARISON:  10/08/2020 FINDINGS: Shallow inspiration. Cardiac enlargement. Pulmonary vascular congestion with perihilar  infiltration or edema. No pleural effusions. No pneumothorax. IMPRESSION: Cardiac enlargement with perihilar infiltration suggesting pneumonia or edema. Similar appearance to previous study. Electronically Signed   By: Burman NievesWilliam  Stevens M.D.   On: 10/08/2020 23:30   DG Chest Portable 1 View  Result Date: 10/08/2020 CLINICAL DATA:  Shortness of breath and respiratory distress. EXAM: PORTABLE CHEST 1 VIEW COMPARISON:  Radiograph and CT 09/23/2020 FINDINGS: Chronic unchanged cardiomegaly. Hilar prominence related to known central pulmonary vasculature. Diffuse increased density throughout the right hemithorax. No pneumothorax. IMPRESSION: Diffuse increased density throughout the right hemithorax may be asymmetric pulmonary edema, multifocal pneumonia or less likely layering effusion. Cardiomegaly stable. Electronically Signed   By: Narda RutherfordMelanie  Sanford M.D.   On: 10/08/2020 16:58        Scheduled Meds:  allopurinol  100 mg Oral Daily   amiodarone  200 mg Oral BID   aspirin EC  81 mg Oral Daily   atorvastatin  40 mg Oral QHS   carvedilol  6.25 mg Oral BID WC   DULoxetine  20 mg Oral Daily   fluticasone furoate-vilanterol  1 puff Inhalation Daily   And   umeclidinium bromide  1 puff Inhalation Daily   folic acid  1 mg Oral Daily   levothyroxine  75 mcg Oral QAC breakfast   montelukast  10 mg Oral QHS   multivitamin with minerals  1 tablet Oral Daily   predniSONE  40 mg Oral Q breakfast   sacubitril-valsartan  1 tablet Oral BID   spironolactone  12.5 mg Oral Daily   thiamine  100 mg Oral Daily   Or   thiamine  100 mg Intravenous Daily   traZODone  100 mg Oral QHS   warfarin  7.5 mg Oral ONCE-1600   Warfarin - Pharmacist Dosing Inpatient   Does not apply q1600   Continuous Infusions:  azithromycin (ZITHROMAX) 500 MG IVPB (Vial-Mate [employer]) Stopped (10/09/20 1915)   cefTRIAXone (ROCEPHIN)  IV 1 g (10/10/20 1012)     LOS: 1 day    Time spent: 32 minutes    Marrion Coyekui Coren Sagan, MD [facility]   To contact the attending provider between 7A-7P or the covering provider during after hours 7P-7A, please log into the web site [email or web address] and access using universal [place] password for that web site. If you do not have the password, please call the hospital operator.  10/10/2020, 3:31 PM

## 2020-10-10 NOTE — Plan of Care (Signed)
  Problem: Education: Goal: Knowledge of General Education information will improve Description: Including pain rating scale, medication(s)/side effects and non-pharmacologic comfort measures 10/10/2020 1230 by Ansel Bong, RN Outcome: Progressing 10/10/2020 1230 by Ansel Bong, RN Outcome: Progressing   Problem: Health Behavior/Discharge Planning: Goal: Ability to manage health-related needs will improve 10/10/2020 1230 by Ansel Bong, RN Outcome: Progressing 10/10/2020 1230 by Ansel Bong, RN Outcome: Progressing   Problem: Clinical Measurements: Goal: Ability to maintain clinical measurements within normal limits will improve 10/10/2020 1230 by Ansel Bong, RN Outcome: Progressing 10/10/2020 1230 by Ansel Bong, RN Outcome: Progressing Goal: Will remain free from infection 10/10/2020 1230 by Ansel Bong, RN Outcome: Progressing 10/10/2020 1230 by Ansel Bong, RN Outcome: Progressing Goal: Diagnostic test results will improve 10/10/2020 1230 by Ansel Bong, RN Outcome: Progressing 10/10/2020 1230 by Ansel Bong, RN Outcome: Progressing Goal: Respiratory complications will improve 10/10/2020 1230 by Ansel Bong, RN Outcome: Progressing 10/10/2020 1230 by Ansel Bong, RN Outcome: Progressing Goal: Cardiovascular complication will be avoided 10/10/2020 1230 by Ansel Bong, RN Outcome: Progressing 10/10/2020 1230 by Ansel Bong, RN Outcome: Progressing   Problem: Activity: Goal: Risk for activity intolerance will decrease 10/10/2020 1230 by Ansel Bong, RN Outcome: Progressing 10/10/2020 1230 by Ansel Bong, RN Outcome: Progressing   Problem: Nutrition: Goal: Adequate nutrition will be maintained 10/10/2020 1230 by Ansel Bong, RN Outcome: Progressing 10/10/2020 1230 by Ansel Bong, RN Outcome: Progressing   Problem: Coping: Goal: Level of anxiety will decrease 10/10/2020 1230 by Ansel Bong, RN Outcome:  Progressing 10/10/2020 1230 by Ansel Bong, RN Outcome: Progressing   Problem: Elimination: Goal: Will not experience complications related to bowel motility 10/10/2020 1230 by Ansel Bong, RN Outcome: Progressing 10/10/2020 1230 by Ansel Bong, RN Outcome: Progressing Goal: Will not experience complications related to urinary retention 10/10/2020 1230 by Ansel Bong, RN Outcome: Progressing 10/10/2020 1230 by Ansel Bong, RN Outcome: Progressing   Problem: Pain Managment: Goal: General experience of comfort will improve 10/10/2020 1230 by Ansel Bong, RN Outcome: Progressing 10/10/2020 1230 by Ansel Bong, RN Outcome: Progressing   Problem: Safety: Goal: Ability to remain free from injury will improve 10/10/2020 1230 by Ansel Bong, RN Outcome: Progressing 10/10/2020 1230 by Ansel Bong, RN Outcome: Progressing   Problem: Skin Integrity: Goal: Risk for impaired skin integrity will decrease 10/10/2020 1230 by Ansel Bong, RN Outcome: Progressing 10/10/2020 1230 by Ansel Bong, RN Outcome: Progressing

## 2020-10-11 DIAGNOSIS — I509 Heart failure, unspecified: Secondary | ICD-10-CM | POA: Diagnosis not present

## 2020-10-11 DIAGNOSIS — F1010 Alcohol abuse, uncomplicated: Secondary | ICD-10-CM

## 2020-10-11 LAB — CBC WITH DIFFERENTIAL/PLATELET
Abs Immature Granulocytes: 0.03 10*3/uL (ref 0.00–0.07)
Basophils Absolute: 0 10*3/uL (ref 0.0–0.1)
Basophils Relative: 0 %
Eosinophils Absolute: 0 10*3/uL (ref 0.0–0.5)
Eosinophils Relative: 1 %
HCT: 33.5 % — ABNORMAL LOW (ref 39.0–52.0)
Hemoglobin: 10.6 g/dL — ABNORMAL LOW (ref 13.0–17.0)
Immature Granulocytes: 1 %
Lymphocytes Relative: 16 %
Lymphs Abs: 0.8 10*3/uL (ref 0.7–4.0)
MCH: 30.9 pg (ref 26.0–34.0)
MCHC: 31.6 g/dL (ref 30.0–36.0)
MCV: 97.7 fL (ref 80.0–100.0)
Monocytes Absolute: 0.5 10*3/uL (ref 0.1–1.0)
Monocytes Relative: 9 %
Neutro Abs: 3.7 10*3/uL (ref 1.7–7.7)
Neutrophils Relative %: 73 %
Platelets: 194 10*3/uL (ref 150–400)
RBC: 3.43 MIL/uL — ABNORMAL LOW (ref 4.22–5.81)
RDW: 15.9 % — ABNORMAL HIGH (ref 11.5–15.5)
WBC: 5.1 10*3/uL (ref 4.0–10.5)
nRBC: 0 % (ref 0.0–0.2)

## 2020-10-11 LAB — BASIC METABOLIC PANEL
Anion gap: 6 (ref 5–15)
BUN: 10 mg/dL (ref 6–20)
CO2: 38 mmol/L — ABNORMAL HIGH (ref 22–32)
Calcium: 8.6 mg/dL — ABNORMAL LOW (ref 8.9–10.3)
Chloride: 92 mmol/L — ABNORMAL LOW (ref 98–111)
Creatinine, Ser: 0.56 mg/dL — ABNORMAL LOW (ref 0.61–1.24)
GFR, Estimated: >60 mL/min (ref >60)
Glucose, Bld: 118 mg/dL — ABNORMAL HIGH (ref 70–99)
Potassium: 4.7 mmol/L (ref 3.5–5.1)
Sodium: 136 mmol/L (ref 135–145)

## 2020-10-11 LAB — PROTIME-INR
INR: 1.6 — ABNORMAL HIGH (ref 0.8–1.2)
Prothrombin Time: 19 seconds — ABNORMAL HIGH (ref 11.4–15.2)

## 2020-10-11 LAB — MAGNESIUM: Magnesium: 2.1 mg/dL (ref 1.7–2.4)

## 2020-10-11 MED ORDER — WARFARIN SODIUM 7.5 MG PO TABS
7.5000 mg | ORAL_TABLET | Freq: Once | ORAL | Status: AC
  Administered 2020-10-11: 7.5 mg via ORAL
  Filled 2020-10-11: qty 1

## 2020-10-11 MED ORDER — TORSEMIDE 20 MG PO TABS
40.0000 mg | ORAL_TABLET | Freq: Two times a day (BID) | ORAL | Status: DC
  Administered 2020-10-11 – 2020-10-12 (×2): 40 mg via ORAL
  Filled 2020-10-11 (×2): qty 2

## 2020-10-11 NOTE — Evaluation (Signed)
Physical Therapy Evaluation Patient Details Name: Rhylee Nunn MRN: [id number] DOB: 1961-01-07 Today's Date: 10/11/2020   History of Present Illness  60 y.o. male was admitted with nausea and SOB, found to have COPD exacerbation and cleared for PE and PNA.  Has seeping venous stasis wounds on BLE's.  Hx of morbid obesity, COPD, sleep apnea, CHF, EF 30-35%, home O2 use, lymphedema, DVT, EtOH abuse, hypothyroidism, HTN, anxiety, depression, L ear deaf, nonischemic cardiomyopathy, a-fib, warfarin use, LE venous stasis with wounds  Clinical Impression  Pt has been evaluated for his mobility with O2 sats dropping with effort to 84% but can recover it with sitting.  Assisting him to sidestep and maneuver walker but limit is O2 sat.  Following him for SNF management but pt is likely to return home with minimal care options there.  He is aware of the recommendation, does not seem interested in congregate care.  Will work with pt to increase his walking endurance but is developing some issues which are not going to resolve with standard PT care.  Beginning to look like a LTC possible pt due to his ongoing respiratory failure episodes and his skin breakdown.    Follow Up Recommendations SNF    Equipment Recommendations  None recommended by PT    Recommendations for Other Services       Precautions / Restrictions Precautions Precautions: Fall Restrictions Weight Bearing Restrictions: No      Mobility  Bed Mobility Overal bed mobility: Needs Assistance Bed Mobility: Supine to Sit;Sit to Supine     Supine to sit: Min guard Sit to supine: Min assist        Transfers Overall transfer level: Needs assistance Equipment used: Rolling walker (2 wheeled) Transfers: Sit to/from Stand Sit to Stand: Min guard         General transfer comment: pt insists on helping himself to stand  Ambulation/Gait Ambulation/Gait assistance: Min guard Gait Distance (Feet): 3 Feet Assistive  device: Rolling walker (2 wheeled) Gait Pattern/deviations: Step-to pattern Gait velocity: reduced   General Gait Details: Sidesteps and lifts walker with no help x guarding  Stairs            Wheelchair Mobility    Modified Rankin (Stroke Patients Only)       Balance Overall balance assessment: Needs assistance Sitting-balance support: Feet supported Sitting balance-Leahy Scale: Good     Standing balance support: Bilateral upper extremity supported;During functional activity Standing balance-Leahy Scale: Fair                               Pertinent Vitals/Pain Pain Assessment: Faces Faces Pain Scale: Hurts little more Pain Location: LE wounds Pain Descriptors / Indicators: Sore Pain Intervention(s): Monitored during session;Repositioned    Home Living Family/patient expects to be discharged to:: Private residence Living Arrangements: Non-relatives/Friends Available Help at Discharge: Other (Comment) Type of Home: Apartment Home Access: Level entry     Home Layout: One level Home Equipment: Walker - 4 wheels;Shower seat;Hand held shower head;Wheelchair - power Additional Comments: Sleeps in regular bed, lift chair    Prior Function Level of Independence: Needs assistance   Gait / Transfers Assistance Needed: Bariatric rollator and scooter for functional mobility, more difficulty with amb recently, several recent hospital admissions  ADL's / Homemaking Assistance Needed: had Covid and is since then struggling with completion of care.  Previous caregiver at home  Comments: gets transport for appts per chart  Hand Dominance   Dominant Hand: Right    Extremity/Trunk Assessment   Upper Extremity Assessment Upper Extremity Assessment: Overall WFL for tasks assessed    Lower Extremity Assessment Lower Extremity Assessment: Generalized weakness    Cervical / Trunk Assessment Cervical / Trunk Assessment: Normal  Communication    Communication: No difficulties  Cognition Arousal/Alertness: Awake/alert Behavior During Therapy: WFL for tasks assessed/performed Overall Cognitive Status: Within Functional Limits for tasks assessed                                        General Comments General comments (skin integrity, edema, etc.): pt is not so debilitated as much as having sats drop with effort, down to 84% with standing even on O2.    Exercises     Assessment/Plan    PT Assessment Patient needs continued PT services  PT Problem List Decreased strength;Decreased range of motion;Decreased activity tolerance;Decreased balance;Decreased mobility;Decreased coordination;Decreased safety awareness;Cardiopulmonary status limiting activity;Obesity;Decreased skin integrity;Pain       PT Treatment Interventions DME instruction;Gait training;Functional mobility training;Therapeutic activities;Therapeutic exercise;Balance training;Patient/family education;Neuromuscular re-education    PT Goals (Current goals can be found in the Care Plan section)  Acute Rehab PT Goals Patient Stated Goal: to go home PT Goal Formulation: With patient Time For Goal Achievement: 10/24/20 Potential to Achieve Goals: Fair    Frequency Min 2X/week   Barriers to discharge Decreased caregiver support has no consistent help    Co-evaluation               AM-PAC PT "6 Clicks" Mobility  Outcome Measure Help needed turning from your back to your side while in a flat bed without using bedrails?: A Little Help needed moving from lying on your back to sitting on the side of a flat bed without using bedrails?: A Little Help needed moving to and from a bed to a chair (including a wheelchair)?: A Little Help needed standing up from a chair using your arms (e.g., wheelchair or bedside chair)?: A Little Help needed to walk in hospital room?: A Little Help needed climbing 3-5 steps with a railing? : Total 6 Click Score: 16     End of Session Equipment Utilized During Treatment: Oxygen Activity Tolerance: Patient limited by fatigue;Treatment limited secondary to medical complications (Comment) Patient left: in bed;with call bell/phone within reach Nurse Communication: Mobility status PT Visit Diagnosis: Muscle weakness (generalized) (M62.81);Unsteadiness on feet (R26.81);Difficulty in walking, not elsewhere classified (R26.2) Pain - Right/Left:  (both) Pain - part of body: Leg    Time: 1133-1205 PT Time Calculation (min) (ACUTE ONLY): 32 min   Charges:   PT Evaluation $PT Eval Moderate Complexity: 1 Mod PT Treatments $Gait Training: 8-22 mins       Ivar Drape 10/11/2020, 4:05 PM  Samul Dada, PT MS Acute Rehab Dept. Number: [facility] [id number] and [facility] [phone]

## 2020-10-11 NOTE — Progress Notes (Signed)
Pt self initiates Bipap HS & PRN

## 2020-10-11 NOTE — Progress Notes (Addendum)
ANTICOAGULATION CONSULT NOTE  Pharmacy Consult for warfarin Indication: atrial fibrillation  No Known Allergies  Patient Measurements: Height: 5\' 8"  (172.7 cm) Weight: (!) 186.2 kg (410 lb 7.9 oz) IBW/kg (Calculated) : 68.4  Vital Signs: Temp: 98.4 F (36.9 C) (08/27 0735) Temp Source: Oral (08/27 0735) BP: 130/77 (08/27 0735) Pulse Rate: 66 (08/27 0735)  Labs: Recent Labs    10/08/20 1542 10/08/20 1549 10/08/20 1741 10/09/20 0708 10/10/20 0555 10/11/20 0603  HGB 10.9*  --   --  10.5* 11.5* 10.6*  HCT 33.7*  --   --  32.0* 36.6* 33.5*  PLT 266  --   --  198 220 194  LABPROT  --    < >  --  15.0 15.8* 19.0*  INR  --    < >  --  1.2 1.3* 1.6*  CREATININE 0.60*  --   --  0.65 0.72 0.56*  CKTOTAL 54  --   --   --   --   --   TROPONINIHS 32*  --  33*  --   --   --    < > = values in this interval not displayed.     Estimated Creatinine Clearance: 160.4 mL/min (A) (by C-G formula based on SCr of 0.56 mg/dL (L)).   Medical History: Past Medical History:  Diagnosis Date   Allergy    Anxiety    Arthritis    Asthma    Brain damage    Chronic pain of both knees 07/13/2018   Clotting disorder (HCC)    COPD (chronic obstructive pulmonary disease) (HCC)    Depression    GERD (gastroesophageal reflux disease)    HFrEF (heart failure with reduced ejection fraction) (HCC)    a. 03/2018 Echo: EF 25-30%, diff HK. Mod LAE.   History of DVT (deep vein thrombosis)    History of pulmonary embolism    a. Chronic coumadin.   Hypertension    MI (myocardial infarction) (HCC)    Morbid obesity (HCC)    Neck pain 07/13/2018   NICM (nonischemic cardiomyopathy) (HCC)    a. s/p Cath x 3 - reportedly nl cors. Last cath 2019 in [place]; b. a. 03/2018 Echo: EF 25-30%, diff HK.   Persistent atrial fibrillation (HCC)    a. 03/2018 s/p DCCV; b. CHA2DS2VASc = 1-->Xarelto (later changed to warfarin); c. 05/2018 recurrent afib-->Amio initiated.   Sleep apnea    Sleep apnea      Assessment: 60  year old male presented with shortness of breath. Patient with h/o atrial fibrillation and VTE on warfarin.   This is the patient's fourth admission in the last two months, each admission his INR has been subtherapeutic around 1.1-1.2 which suggests patient is likely non-compliant with his warfarin at home. His home dose of warfarin is 5 mg daily although this may not represent a true therapeutic dose given suspected non-compliance.   Patient reports last dose of warfarin 8/23.   Pharmacy consult for warfarin management.  DDI: amiodarone (continued from home), azithromycin, augmentin  Date INR Warfarin Dose 8/24 1.1 9 mg 8/25 1.2 9 mg 8/26 1.3 7.5mg  8/27  1.6  Goal of Therapy:  INR 2-3 Monitor platelets by anticoagulation protocol: Yes   Plan:  INR subtherapeutic, have not yet seen the full effects of the prior doses and INR starting to climb.   Will give warfarin 7.5 mg again tonight and anticipate INR to continue to trend up tomorrow. Received 3 days of azithromycin, and continuing augmentin (  both can increase warfarin effect).   CBC/INR with morning labs.  Albina Billet, PharmD, BCPS Clinical Pharmacist 10/11/2020 8:53 AM

## 2020-10-11 NOTE — Progress Notes (Signed)
PROGRESS NOTE    [name] ReamerKenneth Dwayne Engebretson  VWU:[id number] DOB: [birth year] DOA: 10/08/2020 PCP: Smitty CordsKaramalegos, Alexander J, DO   Chief complaint.  Shortness of breath. Brief Narrative:  [name] [name] is a 60 y.o. male with medical history significant for Morbid obesity, chronic hypoxic respiratory failure on 4 L nasal cannula at baseline, obesity hypoventilation syndrome, hypertension, anxiety, alcohol use disorder, depression, history of left ear deafness at the age of 60, history of nonischemic cardiomyopathy, COPD, heart failure reduced ejection fraction, OSA, persistent/chronic atrial fibrillation, history of DVT and PE, on chronic anticoagulation with warfarin, chronic bilateral lower extremity venous stasis, presents emergency department for chief concerns of shortness of breath and nausea. He had a CT angiogram of the chest, did not see large PE, no evidence of pneumonia.  He diagnosed with COPD exacerbation, placed on steroids and Zithromax.   Assessment & Plan:   Principal Problem:   Shortness of breath Active Problems:   HTN (hypertension)   Chronic pain syndrome   Personal history of DVT (deep vein thrombosis)   History of pulmonary embolism (on Coumadin)   HLD (hyperlipidemia)   CAD (coronary artery disease)   Chronic anticoagulation (warfarin  COUMADIN)   Depression   Alcohol abuse   COPD with acute exacerbation (HCC)   Atrial fibrillation, chronic (HCC)   Class 3 obesity with alveolar hypoventilation, serious comorbidity, and body mass index (BMI) of 60.0 to 69.9 in adult [facility](HCC)   CHF (congestive heart failure) (HCC)   Chronic systolic CHF (congestive heart failure) (HCC)   Chronic respiratory failure (HCC)   Alcohol dependence with unspecified alcohol-induced disorder (HCC)   Atrial fibrillation (HCC)   Hypomagnesemia   COPD exacerbation (HCC)    COPD exacerbation. Chronic hypoxemic respiratory failure. Obstructive sleep apnea. Obesity hypoventilation  syndrome. Chronic diastolic congestive heart failure. Patient condition appears to be caused by COPD exacerbation, may have some component of aspiration.  Continue antibiotics with Augmentin and Zithromax.  Continue steroids. Patient has chronic diastolic congestive heart failure, I restarted torsemide.  Alcohol abuse. No evidence of alcohol withdrawal.  Continue CIWA protocol, thiamine folic acid. Discussed with the patient, patient habitable binge drinking has been affecting his health, causing frequent exacerbation of congestive heart failure and possible aspiration.  But patient is unwilling to stop drinking.  As result, it is not possible to prevent patient readmission.   Paroxysmal atrial fibrillation. Continue warfarin.    DVT prophylaxis: Warfarin Code Status: Partial Family Communication:  Disposition Plan:    Status is: Inpatient  Remains inpatient appropriate because:Inpatient level of care appropriate due to severity of illness  Dispo: The patient is from: Home              Anticipated d/c is to: Home              Patient currently is not medically stable to d/c.   Difficult to place patient No        I/O last 3 completed shifts: In: 1256 [P.O.:800; IV Piggyback:456] Out: 1800 [Urine:1800] Total I/O In: 720 [P.O.:720] Out: 575 [Urine:575]     Consultants:  None  Procedures: None  Antimicrobials: Augmentin, zithromax   Subjective: Patient feels better today, he slept well last night with CPAP.  Less short of breath today.  Still feels significant weakness, but he refused to go to nursing home. No abdominal pain or nausea vomiting. No dysuria or hematuria.  Objective: Vitals:   10/11/20 0600 10/11/20 0735 10/11/20 0947 10/11/20 1208  BP:  130/77  116/79  Pulse:  66 65 82  Resp: 18 18  17   Temp:  98.4 F (36.9 C)  98.2 F (36.8 C)  TempSrc:  Oral    SpO2:  100%  97%  Weight:      Height:        Intake/Output Summary (Last 24 hours) at  10/11/2020 1237 Last data filed at 10/11/2020 1012 Gross per 24 hour  Intake 1126 ml  Output 1875 ml  Net -749 ml   Filed Weights   10/08/20 1516 10/10/20 0452  Weight: (!) 180.9 kg (!) 186.2 kg    Examination:  General exam: Appears calm and comfortable  Respiratory system: Decreased breathing sounds. Respiratory effort normal. Cardiovascular system: S1 & S2 heard, RRR. No JVD, murmurs, rubs, gallops or clicks. 1+ pedal edema. Gastrointestinal system: Abdomen is nondistended, soft and nontender. No organomegaly or masses felt. Normal bowel sounds heard. Central nervous system: Alert and oriented. No focal neurological deficits. Extremities: Symmetric 5 x 5 power. Skin: No rashes, lesions or ulcers Psychiatry: Judgement and insight appear normal. Mood & affect appropriate.     Data Reviewed: I have personally reviewed following labs and imaging studies  CBC: Recent Labs  Lab 10/08/20 1542 10/09/20 0708 10/10/20 0555 10/11/20 0603  WBC 7.5 7.6 4.8 5.1  NEUTROABS 5.5  --   --  3.7  HGB 10.9* 10.5* 11.5* 10.6*  HCT 33.7* 32.0* 36.6* 33.5*  MCV 95.7 95.8 95.8 97.7  PLT 266 198 220 194   Basic Metabolic Panel: Recent Labs  Lab 10/08/20 1542 10/09/20 0708 10/10/20 0555 10/11/20 0603  NA 138 135 134* 136  K 4.3 3.8 4.5 4.7  CL 96* 93* 92* 92*  CO2 29 32 37* 38*  GLUCOSE 138* 109* 171* 118*  BUN 11 9 10 10   CREATININE 0.60* 0.65 0.72 0.56*  CALCIUM 7.8* 7.9* 8.3* 8.6*  MG 1.6*  --  1.4* 2.1  PHOS 3.2  --   --   --    GFR: Estimated Creatinine Clearance: 160.4 mL/min (A) (by C-G formula based on SCr of 0.56 mg/dL (L)). Liver Function Tests: Recent Labs  Lab 10/08/20 1542  AST 20  ALT 15  ALKPHOS 87  BILITOT 0.5  PROT 6.3*  ALBUMIN 2.8*   Recent Labs  Lab 10/08/20 1542  LIPASE 24   No results for input(s): AMMONIA in the last 168 hours. Coagulation Profile: Recent Labs  Lab 10/08/20 1549 10/09/20 0708 10/10/20 0555 10/11/20 0603  INR 1.1 1.2  1.3* 1.6*   Cardiac Enzymes: Recent Labs  Lab 10/08/20 1542  CKTOTAL 54   BNP (last 3 results) No results for input(s): PROBNP in the last 8760 hours. HbA1C: No results for input(s): HGBA1C in the last 72 hours. CBG: No results for input(s): GLUCAP in the last 168 hours. Lipid Profile: No results for input(s): CHOL, HDL, LDLCALC, TRIG, CHOLHDL, LDLDIRECT in the last 72 hours. Thyroid Function Tests: No results for input(s): TSH, T4TOTAL, FREET4, T3FREE, THYROIDAB in the last 72 hours. Anemia Panel: No results for input(s): VITAMINB12, FOLATE, FERRITIN, TIBC, IRON, RETICCTPCT in the last 72 hours. Sepsis Labs: Recent Labs  Lab 10/08/20 1542 10/08/20 1741 10/09/20 0708 10/10/20 0555  PROCALCITON <0.10  --  <0.10 2.02  LATICACIDVEN 3.6* 2.6*  --   --     Recent Results (from the past 240 hour(s))  Blood culture (routine x 2)     Status: None (Preliminary result)   Collection Time: 10/08/20  3:42 PM   Specimen:  BLOOD  Result Value Ref Range Status   Specimen Description BLOOD BLOOD RIGHT ARM  Final   Special Requests   Final    BOTTLES DRAWN AEROBIC AND ANAEROBIC Blood Culture adequate volume   Culture   Final    NO GROWTH 3 DAYS Performed at [facility], [address], [place], [place] [address]    Report Status PENDING  Incomplete  Resp Panel by RT-PCR (Flu A&B, Covid) Nasopharyngeal Swab     Status: None   Collection Time: 10/08/20  3:48 PM   Specimen: Nasopharyngeal Swab; Nasopharyngeal(NP) swabs in vial transport medium  Result Value Ref Range Status   SARS Coronavirus 2 by RT PCR NEGATIVE NEGATIVE Final    Comment: (NOTE) SARS-CoV-2 target nucleic acids are NOT DETECTED.  The SARS-CoV-2 RNA is generally detectable in upper respiratory specimens during the acute phase of infection. The lowest concentration of SARS-CoV-2 viral copies this assay can detect is 138 copies/mL. A negative result does not preclude SARS-Cov-2 infection and should not be  used as the sole basis for treatment or other patient management decisions. A negative result may occur with  improper specimen collection/handling, submission of specimen other than nasopharyngeal swab, presence of viral mutation(s) within the areas targeted by this assay, and inadequate number of viral copies(<138 copies/mL). A negative result must be combined with clinical observations, patient history, and epidemiological information. The expected result is Negative.  Fact Sheet for Patients:  BloggerCourse.com  Fact Sheet for Healthcare Providers:  SeriousBroker.it  This test is no t yet approved or cleared by the [place] FDA and  has been authorized for detection and/or diagnosis of SARS-CoV-2 by FDA under an Emergency Use Authorization (EUA). This EUA will remain  in effect (meaning this test can be used) for the duration of the COVID-19 declaration under Section 564(b)(1) of the Act, 21 U.S.C.section 360bbb-3(b)(1), unless the authorization is terminated  or revoked sooner.       Influenza A by PCR NEGATIVE NEGATIVE Final   Influenza B by PCR NEGATIVE NEGATIVE Final    Comment: (NOTE) The Xpert Xpress SARS-CoV-2/FLU/RSV plus assay is intended as an aid in the diagnosis of influenza from Nasopharyngeal swab specimens and should not be used as a sole basis for treatment. Nasal washings and aspirates are unacceptable for Xpert Xpress SARS-CoV-2/FLU/RSV testing.  Fact Sheet for Patients: BloggerCourse.com  Fact Sheet for Healthcare Providers: SeriousBroker.it  This test is not yet approved or cleared by the [place] FDA and has been authorized for detection and/or diagnosis of SARS-CoV-2 by FDA under an Emergency Use Authorization (EUA). This EUA will remain in effect (meaning this test can be used) for the duration of the COVID-19 declaration under Section  564(b)(1) of the Act, 21 U.S.C. section 360bbb-3(b)(1), unless the authorization is terminated or revoked.  Performed at [facility], [address] [place], [place] [address]   Blood culture (routine x 2)     Status: Abnormal (Preliminary result)   Collection Time: 10/08/20  5:41 PM   Specimen: BLOOD  Result Value Ref Range Status   Specimen Description   Final    BLOOD BLOOD RIGHT FOREARM Performed at [facility], [address] [place], [place] [address]    Special Requests   Final    BOTTLES DRAWN AEROBIC AND ANAEROBIC Blood Culture adequate volume Performed at [facility], [address] [place], [place] [address]    Culture  Setup Time   Final    GRAM POSITIVE  COCCI AEROBIC BOTTLE ONLY CRITICAL RESULT CALLED TO, READ BACK BY AND VERIFIED WITH: WALDY NAZARI AT 2111 10/09/20 BY JRH    Culture (A)  Final    STAPHYLOCOCCUS EPIDERMIDIS THE SIGNIFICANCE OF ISOLATING THIS ORGANISM FROM A SINGLE SET OF BLOOD CULTURES WHEN MULTIPLE SETS ARE DRAWN IS UNCERTAIN. PLEASE NOTIFY THE MICROBIOLOGY DEPARTMENT WITHIN ONE WEEK IF SPECIATION AND SENSITIVITIES ARE REQUIRED. Performed at [facility], [address], [place], [place] [address]    Report Status PENDING  Incomplete  Blood Culture ID Panel (Reflexed)     Status: Abnormal   Collection Time: 10/08/20  5:41 PM  Result Value Ref Range Status   Enterococcus faecalis NOT DETECTED NOT DETECTED Final   Enterococcus Faecium NOT DETECTED NOT DETECTED Final   Listeria monocytogenes NOT DETECTED NOT DETECTED Final   Staphylococcus species DETECTED (A) NOT DETECTED Final    Comment: WALDY NAZARI AT 2111 10/09/20 BY JRH   Staphylococcus aureus (BCID) NOT DETECTED NOT DETECTED Final   Staphylococcus epidermidis DETECTED (A) NOT DETECTED Final    Comment: Methicillin (oxacillin) resistant coagulase negative staphylococcus. Possible blood culture contaminant (unless isolated from more than one blood  culture draw or clinical case suggests pathogenicity). No antibiotic treatment is indicated for blood  culture contaminants. WALDY NAZARI AT 2111 10/09/20 BY JRH    Staphylococcus lugdunensis NOT DETECTED NOT DETECTED Final   Streptococcus species NOT DETECTED NOT DETECTED Final   Streptococcus agalactiae NOT DETECTED NOT DETECTED Final   Streptococcus pneumoniae NOT DETECTED NOT DETECTED Final   Streptococcus pyogenes NOT DETECTED NOT DETECTED Final   A.calcoaceticus-baumannii NOT DETECTED NOT DETECTED Final   Bacteroides fragilis NOT DETECTED NOT DETECTED Final   Enterobacterales NOT DETECTED NOT DETECTED Final   Enterobacter cloacae complex NOT DETECTED NOT DETECTED Final   Escherichia coli NOT DETECTED NOT DETECTED Final   Klebsiella aerogenes NOT DETECTED NOT DETECTED Final   Klebsiella oxytoca NOT DETECTED NOT DETECTED Final   Klebsiella pneumoniae NOT DETECTED NOT DETECTED Final   Proteus species NOT DETECTED NOT DETECTED Final   Salmonella species NOT DETECTED NOT DETECTED Final   Serratia marcescens NOT DETECTED NOT DETECTED Final   Haemophilus influenzae NOT DETECTED NOT DETECTED Final   Neisseria meningitidis NOT DETECTED NOT DETECTED Final   Pseudomonas aeruginosa NOT DETECTED NOT DETECTED Final   Stenotrophomonas maltophilia NOT DETECTED NOT DETECTED Final   Candida albicans NOT DETECTED NOT DETECTED Final   Candida auris NOT DETECTED NOT DETECTED Final   Candida glabrata NOT DETECTED NOT DETECTED Final   Candida krusei NOT DETECTED NOT DETECTED Final   Candida parapsilosis NOT DETECTED NOT DETECTED Final   Candida tropicalis NOT DETECTED NOT DETECTED Final   Cryptococcus neoformans/gattii NOT DETECTED NOT DETECTED Final   Methicillin resistance mecA/C DETECTED (A) NOT DETECTED Final    CommentArabella Merles AT 2111 10/09/20 BY [facility] Performed at [facility], [address], [place], [place] [address]          Radiology Studies: No results  found.      Scheduled Meds:  allopurinol  100 mg Oral Daily   amiodarone  200 mg Oral BID   amoxicillin-clavulanate  1 tablet Oral Q12H   aspirin EC  81 mg Oral Daily   atorvastatin  40 mg Oral QHS   carvedilol  6.25 mg Oral BID WC   DULoxetine  20 mg Oral Daily   fluticasone furoate-vilanterol  1 puff Inhalation Daily   And   umeclidinium bromide  1 puff  Inhalation Daily   folic acid  1 mg Oral Daily   levothyroxine  75 mcg Oral QAC breakfast   montelukast  10 mg Oral QHS   multivitamin with minerals  1 tablet Oral Daily   predniSONE  40 mg Oral Q breakfast   sacubitril-valsartan  1 tablet Oral BID   spironolactone  12.5 mg Oral Daily   thiamine  100 mg Oral Daily   Or   thiamine  100 mg Intravenous Daily   torsemide  40 mg Oral BID   traZODone  100 mg Oral QHS   warfarin  7.5 mg Oral ONCE-1600   Warfarin - Pharmacist Dosing Inpatient   Does not apply q1600   Continuous Infusions:   LOS: 2 days    Time spent: 28 minutes    Marrion Coy, MD [facility]   To contact the attending provider between 7A-7P or the covering provider during after hours 7P-7A, please log into the web site [email or web address] and access using universal [place] password for that web site. If you do not have the password, please call the hospital operator.  10/11/2020, 12:37 PM

## 2020-10-12 DIAGNOSIS — I509 Heart failure, unspecified: Secondary | ICD-10-CM | POA: Diagnosis not present

## 2020-10-12 DIAGNOSIS — J9611 Chronic respiratory failure with hypoxia: Secondary | ICD-10-CM

## 2020-10-12 LAB — BASIC METABOLIC PANEL
Anion gap: 8 (ref 5–15)
BUN: 15 mg/dL (ref 6–20)
CO2: 39 mmol/L — ABNORMAL HIGH (ref 22–32)
Calcium: 9.1 mg/dL (ref 8.9–10.3)
Chloride: 88 mmol/L — ABNORMAL LOW (ref 98–111)
Creatinine, Ser: 0.76 mg/dL (ref 0.61–1.24)
GFR, Estimated: >60 mL/min (ref >60)
Glucose, Bld: 118 mg/dL — ABNORMAL HIGH (ref 70–99)
Potassium: 4.6 mmol/L (ref 3.5–5.1)
Sodium: 135 mmol/L (ref 135–145)

## 2020-10-12 LAB — CBC
HCT: 35.4 % — ABNORMAL LOW (ref 39.0–52.0)
Hemoglobin: 11.6 g/dL — ABNORMAL LOW (ref 13.0–17.0)
MCH: 31.7 pg (ref 26.0–34.0)
MCHC: 32.8 g/dL (ref 30.0–36.0)
MCV: 96.7 fL (ref 80.0–100.0)
Platelets: 229 10*3/uL (ref 150–400)
RBC: 3.66 MIL/uL — ABNORMAL LOW (ref 4.22–5.81)
RDW: 15.9 % — ABNORMAL HIGH (ref 11.5–15.5)
WBC: 6.9 10*3/uL (ref 4.0–10.5)
nRBC: 0 % (ref 0.0–0.2)

## 2020-10-12 LAB — CULTURE, BLOOD (ROUTINE X 2): Special Requests: ADEQUATE

## 2020-10-12 LAB — PROTIME-INR
INR: 1.7 — ABNORMAL HIGH (ref 0.8–1.2)
Prothrombin Time: 20 seconds — ABNORMAL HIGH (ref 11.4–15.2)

## 2020-10-12 LAB — MAGNESIUM: Magnesium: 1.7 mg/dL (ref 1.7–2.4)

## 2020-10-12 MED ORDER — AMOXICILLIN-POT CLAVULANATE 875-125 MG PO TABS: 1.0000 | ORAL_TABLET | Freq: Two times a day (BID) | ORAL | 0 refills | Status: AC

## 2020-10-12 MED ORDER — LORAZEPAM 0.5 MG PO TABS
0.5000 mg | ORAL_TABLET | Freq: Once | ORAL | Status: AC
  Administered 2020-10-12: 0.5 mg via ORAL
  Filled 2020-10-12: qty 1

## 2020-10-12 MED ORDER — PREDNISONE 20 MG PO TABS: ORAL_TABLET | ORAL | 0 refills | Status: AC

## 2020-10-12 MED ORDER — WARFARIN SODIUM 1 MG PO TABS: 1.0000 mg | ORAL_TABLET | Freq: Every day | ORAL | 0 refills | Status: DC

## 2020-10-12 MED ORDER — WARFARIN SODIUM 6 MG PO TABS
6.0000 mg | ORAL_TABLET | Freq: Every day | ORAL | 0 refills | Status: DC
Start: 2020-10-12 — End: 2020-11-06

## 2020-10-12 MED ORDER — WARFARIN SODIUM 6 MG PO TABS
6.0000 mg | ORAL_TABLET | Freq: Once | ORAL | Status: DC
  Filled 2020-10-12: qty 1

## 2020-10-12 NOTE — Plan of Care (Signed)

## 2020-10-12 NOTE — Progress Notes (Signed)
ANTICOAGULATION CONSULT NOTE  Pharmacy Consult for warfarin Indication: atrial fibrillation  No Known Allergies  Patient Measurements: Height: 5\' 8"  (172.7 cm) Weight: (!) 186.2 kg (410 lb 7.9 oz) IBW/kg (Calculated) : 68.4  Vital Signs: Temp: 98.2 F (36.8 C) (08/27 2341) Temp Source: Oral (08/27 2341) BP: 137/71 (08/28 [birth year]1973) Pulse Rate: 69 (08/28 0608)  Labs: Recent Labs    10/10/20 0555 10/11/20 0603 10/12/20 0654  HGB 11.5* 10.6* 11.6*  HCT 36.6* 33.5* 35.4*  PLT 220 194 229  LABPROT 15.8* 19.0* 20.0*  INR 1.3* 1.6* 1.7*  CREATININE 0.72 0.56* 0.76     Estimated Creatinine Clearance: 160.4 mL/min (by C-G formula based on SCr of 0.76 mg/dL).   Medical History: Past Medical History:  Diagnosis Date   Allergy    Anxiety    Arthritis    Asthma    Brain damage    Chronic pain of both knees 07/13/2018   Clotting disorder (HCC)    COPD (chronic obstructive pulmonary disease) (HCC)    Depression    GERD (gastroesophageal reflux disease)    HFrEF (heart failure with reduced ejection fraction) (HCC)    a. 03/2018 Echo: EF 25-30%, diff HK. Mod LAE.   History of DVT (deep vein thrombosis)    History of pulmonary embolism    a. Chronic coumadin.   Hypertension    MI (myocardial infarction) (HCC)    Morbid obesity (HCC)    Neck pain 07/13/2018   NICM (nonischemic cardiomyopathy) (HCC)    a. s/p Cath x 3 - reportedly nl cors. Last cath 2019 in [place]; b. a. 03/2018 Echo: EF 25-30%, diff HK.   Persistent atrial fibrillation (HCC)    a. 03/2018 s/p DCCV; b. CHA2DS2VASc = 1-->Xarelto (later changed to warfarin); c. 05/2018 recurrent afib-->Amio initiated.   Sleep apnea    Sleep apnea      Assessment: 60 year old male presented with shortness of breath. Patient with h/o atrial fibrillation and VTE on warfarin.   This is the patient's fourth admission in the last two months, each admission his INR has been subtherapeutic around 1.1-1.2 which suggests patient is likely  non-compliant with his warfarin at home. His home dose of warfarin is 5 mg daily although this may not represent a true therapeutic dose given suspected non-compliance.   Patient reports last dose of warfarin 8/23.   Pharmacy consult for warfarin management.  DDI: amiodarone (continued from home), azithromycin, augmentin  Date INR Warfarin Dose 8/24 1.1 9 mg 8/25 1.2 9 mg 8/26 1.3 7.5mg  8/27  1.6 7.5mg  8/28     1.7  Goal of Therapy:  INR 2-3 Monitor platelets by anticoagulation protocol: Yes   Plan:  INR subtherapeutic, have not yet seen the full effects of the prior doses and INR starting to climb.   Will give warfarin 6 mg tonight and anticipate INR to continue to trend up tomorrow. Received 3 days of azithromycin, and continuing augmentin (both can increase warfarin effect).   CBC/INR with morning labs.  9/28, PharmD, BCPS Clinical Pharmacist 10/12/2020 9:09 AM

## 2020-10-12 NOTE — Discharge Summary (Addendum)
Physician Discharge Summary  Patient ID: [name] [name] MRN: [id number] DOB/AGE: [birth year] 60 y.o.  Admit date: 10/08/2020 Discharge date: 10/12/2020  Admission Diagnoses:  Discharge Diagnoses:  Principal Problem:   Shortness of breath Active Problems:   HTN (hypertension)   Chronic pain syndrome   Personal history of DVT (deep vein thrombosis)   History of pulmonary embolism (on Coumadin)   HLD (hyperlipidemia)   CAD (coronary artery disease)   Chronic anticoagulation (warfarin  COUMADIN)   Depression   Alcohol abuse   COPD with acute exacerbation (HCC)   Atrial fibrillation, chronic (HCC)   Class 3 obesity with alveolar hypoventilation, serious comorbidity, and body mass index (BMI) of 60.0 to 69.9 in adult [facility])   CHF (congestive heart failure) (HCC)   Chronic systolic CHF (congestive heart failure) (HCC)   Chronic respiratory failure (HCC)   Alcohol dependence with unspecified alcohol-induced disorder (HCC)   Atrial fibrillation (HCC)   Hypomagnesemia   COPD exacerbation (HCC) Paroxysmal atrial fibrillation.  Discharged Condition: fair  Hospital Course:  [name] [name] is a 60 y.o. 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 He had a CT angiogram of the chest, did not see large PE, no evidence of pneumonia.  He diagnosed with COPD exacerbation, placed on steroids and Zithromax.   COPD exacerbation. Chronic  hypoxemic respiratory failure. Obstructive sleep apnea. Obesity hypoventilation syndrome. Chronic systolic congestive heart failure. EF 25-30%. (Not diasolic chf) Paroxysmal atrial fibrillation Patient condition appears to be caused by COPD exacerbation, may have some component of aspiration.  Continue antibiotics with Augmentin.  Continue steroids. Patient has chronic diastolic congestive heart failure, restarted torsemide. Patient condition had improved, currently is medically stable to be discharged. Please note that warfarin dose will be increased to 6 mg daily due to subtherapeutic PT/INR   Alcohol abuse. Has no evidence of alcohol withdrawal.  Paroxysmal atrial fibrillation. Continue warfarin.  Discussed with the patient, patient has a habit of binge drinking, he normally drinks 2 or 3 times a month.  But when he is drinking, he normally drinks a large amount of alcohol.  This has been affecting his health, causing frequent exacerbation of congestive heart failure and possible aspiration.  But patient is unwilling to stop drinking.  As result, it became very difficult to prevent recurrence of congestive heart of exacerbation, aspiration, and a COPD exacerbation.  Due to his low ejection fraction, continued alcohol drinking, long-term prognosis is poor. Patient has also been evaluated by physical therapy, he does not have any interest in going to a nursing home.  At this point, I will discharge him with the follow-ups with his PCP.  I will also try to set up home care with physical therapy and RN.      Consults: None  Significant Diagnostic Studies:   Treatments: Steroids, antibiotics  Discharge Exam: Blood pressure 124/87, pulse 60, temperature 98.3 F (36.8 C), resp. rate 16, height 5\' 8"  (1.727 m), weight (!) 186.2 kg, SpO2 97 %. General appearance: alert and cooperative Resp: clear to auscultation bilaterally Cardio: regular rate and rhythm, S1, S2  normal, no murmur, click,  rub or gallop GI: soft, non-tender; bowel sounds normal; no masses,  no organomegaly Extremities: Chronic edema  Disposition: Discharge disposition: 01-Home or Self Care       Discharge Instructions     Ambulatory referral to Wound Clinic   Complete by: As directed    Diet - low sodium heart healthy   Complete by: As directed    Discharge wound care:   Complete by: As directed    Keep clean, wash and dry, refer to wound care   Increase activity slowly   Complete by: As directed       Allergies as of 10/12/2020   No Known Allergies      Medication List     TAKE these medications    albuterol 108 (90 Base) MCG/ACT inhaler Commonly known as: VENTOLIN HFA Inhale 2 puffs into the lungs every 4 (four) hours as needed for wheezing or shortness of breath.   albuterol (2.5 MG/3ML) 0.083% nebulizer solution Commonly known as: PROVENTIL Take 3 mLs (2.5 mg total) by nebulization every 4 (four) hours as needed for wheezing or shortness of breath.   allopurinol 100 MG tablet Commonly known as: ZYLOPRIM Take 1 tablet (100 mg total) by mouth daily.   amiodarone 400 MG tablet Commonly known as: PACERONE Take 0.5 tablets (200 mg total) by mouth 2 (two) times daily.   amoxicillin-clavulanate 875-125 MG tablet Commonly known as: AUGMENTIN Take 1 tablet by mouth every 12 (twelve) hours for 4 days.   aspirin EC 81 MG tablet Take 1 tablet (81 mg total) by mouth daily. Swallow whole.   atorvastatin 40 MG tablet Commonly known as: LIPITOR Take 1 tablet (40 mg total) by mouth daily.   carvedilol 6.25 MG tablet Commonly known as: COREG Take 1 tablet (6.25 mg total) by mouth 2 (two) times daily with a meal.   colchicine 0.6 MG tablet Take 1 tablet (0.6 mg total) by mouth daily.   diclofenac Sodium 1 % Gel Commonly known as: VOLTAREN Apply 2-4 g topically daily as needed (pain).   DULoxetine 20 MG capsule Commonly known as: CYMBALTA Take 1 capsule (20 mg total) by mouth  daily.   guaiFENesin-dextromethorphan 100-10 MG/5ML syrup Commonly known as: ROBITUSSIN DM Take 5 mLs by mouth every 4 (four) hours as needed for cough.   levothyroxine 75 MCG tablet Commonly known as: SYNTHROID Take 1 tablet (75 mcg total) by mouth daily before breakfast.   montelukast 10 MG tablet Commonly known as: SINGULAIR Take 1 tablet (10 mg total) by mouth at bedtime.   multivitamin with minerals Tabs tablet Take 1 tablet by mouth daily.   naphazoline-glycerin 0.012-0.2 % Soln Commonly known as: CLEAR EYES REDNESS Place 1-2 drops into both eyes 4 (four) times daily as needed for eye irritation.   nitroGLYCERIN 0.4 MG SL tablet Commonly known as: NITROSTAT Place 1 tablet under tongue every 5 minutes as needed for chest pain. (No more than 3 doses within 15 minutes)   oxyCODONE-acetaminophen 7.5-325 MG tablet Commonly known as: PERCOCET Take 1 tablet by mouth every 8 (eight) hours as needed for severe pain.   predniSONE 20 MG tablet Commonly known as: DELTASONE Take 2 tablets (40 mg total) by mouth daily with breakfast for 1 day, THEN 1 tablet (20 mg total) daily with breakfast for 3 days, THEN 0.5 tablets (10 mg total) daily with breakfast for 3 days. Start taking on: October 13, 2020   sacubitril-valsartan 24-26 MG Commonly known as: ENTRESTO Take  1 tablet by mouth 2 (two) times daily.   spironolactone 25 MG tablet Commonly known as: ALDACTONE Take 0.5 tablets (12.5 mg total) by mouth daily.   SUMAtriptan 50 MG tablet Commonly known as: IMITREX Take 50 mg by mouth daily as needed for migraine. May repeat in 2 hours if headache persists or recurs.   torsemide 20 MG tablet Commonly known as: DEMADEX Take 2 tablets (40 mg total) by mouth daily.   Trelegy Ellipta 100-62.5-25 MCG/INH Aepb Generic drug: Fluticasone-Umeclidin-Vilant Inhale 1 Inhaler into the lungs daily.   warfarin 5 MG tablet Commonly known as: COUMADIN Take 1 tablet (5 mg total) by mouth  daily.               Discharge Care Instructions  (From admission, onward)           Start     Ordered   10/12/20 0000  Discharge wound care:       Comments: Keep clean, wash and dry, refer to wound care   10/12/20 0923            Follow-up Information     [name], DO Follow up in 1 week(s).   Specialty: Family Medicine Contact information: [address] [place] [address] [phone]         [name], MD .   Specialty: Cardiology Contact information: [address] [place] [address] [phone]                34 minutes Signed: Marrion Coy 10/12/2020, 9:23 AM

## 2020-10-12 NOTE — Progress Notes (Signed)
Pt is self-initiating Bip[facility] on his own

## 2020-10-12 NOTE — Progress Notes (Signed)
DC paperwork reviewed and given to pt at bedside. All questions answered at this time. Waiting on cab to pick up pt.

## 2020-10-12 NOTE — TOC Transition Note (Signed)
Transition of Care [facility]) - CM/SW Discharge Note   Patient Details  Name: Jayvien Rowlette MRN: [id number] Date of Birth: 1960-03-26  Transition of Care [facility]) CM/SW Contact:  Bing Quarry, RN Phone Number: 10/12/2020, 3:26 PM   Clinical Narrative:  Recommendations for SNF but patient refuses. Discharging home with taxi voucher provided. Refused to wait on any oxygen for transport per Unit RN. Stated he has made it home without it before. Gabriel Cirri RN CM         Barriers to Discharge: Continued Medical Work up   Patient Goals and CMS Choice Patient states their goals for this hospitalization and ongoing recovery are:: to go home CMS Medicare.gov Compare Post Acute Care list provided to:: Patient Choice offered to / list presented to : Patient  Discharge Placement                       Discharge Plan and Services                                     Social Determinants of Health (SDOH) Interventions     Readmission Risk Interventions Readmission Risk Prevention Plan 01/11/2020 12/24/2019 01/25/2019  Transportation Screening Complete Complete Complete  PCP or Specialist Appt within 3-5 Days - - -  HRI or Home Care Consult - - -  Social Work Consult for Recovery Care Planning/Counseling - - -  Palliative Care Screening - - -  Medication Review [employer]) Complete Complete Complete  PCP or Specialist appointment within 3-5 days of discharge Complete Complete Complete  HRI or Home Care Consult - - Complete  SW Recovery Care/Counseling Consult Complete Complete Complete  Palliative Care Screening Complete Not Applicable Not Applicable  Skilled Nursing Facility Not Applicable Not Applicable Not Applicable  Some recent data might be hidden

## 2020-10-13 ENCOUNTER — Telehealth: Payer: Self-pay

## 2020-10-13 ENCOUNTER — Telehealth: Payer: Self-pay | Admitting: Family Medicine

## 2020-10-13 DIAGNOSIS — F3341 Major depressive disorder, recurrent, in partial remission: Secondary | ICD-10-CM

## 2020-10-13 DIAGNOSIS — I509 Heart failure, unspecified: Secondary | ICD-10-CM | POA: Diagnosis not present

## 2020-10-13 DIAGNOSIS — R29898 Other symptoms and signs involving the musculoskeletal system: Secondary | ICD-10-CM

## 2020-10-13 DIAGNOSIS — I5043 Acute on chronic combined systolic (congestive) and diastolic (congestive) heart failure: Secondary | ICD-10-CM

## 2020-10-13 DIAGNOSIS — L989 Disorder of the skin and subcutaneous tissue, unspecified: Secondary | ICD-10-CM

## 2020-10-13 LAB — CULTURE, BLOOD (ROUTINE X 2)
Culture: NO GROWTH
Special Requests: ADEQUATE

## 2020-10-13 NOTE — Telephone Encounter (Signed)
Transition Care Management Follow-up Telephone Call Date of discharge and from where: 10/12/20 [facility] How have you been since you were released from the hospital? Pt states he is doing okay and taking antibiotics Any questions or concerns? No  Items Reviewed: Did the pt receive and understand the discharge instructions provided? Yes  Medications obtained and verified? Yes  Other? No  Any new allergies since your discharge? No  Dietary orders reviewed? Yes Do you have support at home? Yes   Home Care and Equipment/Supplies: Were home health services ordered? no Were any new equipment or medical supplies ordered?  No - pt currently using 4L continuous O2 maintained by [employer]  Functional Questionnaire: (I = Independent and D = Dependent) ADLs: I with assistance  Bathing/Dressing- needs assistance  Meal Prep- I - light tasks only  Eating- I  Maintaining continence- I  Transferring/Ambulation- I with walker  Managing Meds- I  Follow up appointments reviewed:  PCP Hospital f/u appt confirmed? No  pt declined to schedule hosp fu  Specialist Hospital f/u appt confirmed? No  pt to schedule with cardiology; pt does have appt [facility] with Clarisa Kindred NP on 10/15/20 at 2:00 Are transportation arrangements needed? Yes  - pt already uses medicaid transportationi If their condition worsens, is the pt aware to call PCP or go to the Emergency Dept.? Yes Was the patient provided with contact information for the PCP's office or ED? Yes Was to pt encouraged to call back with questions or concerns? Yes

## 2020-10-13 NOTE — Telephone Encounter (Signed)
Change in therapy . Discontinued medication 09/12/20

## 2020-10-14 DIAGNOSIS — I509 Heart failure, unspecified: Secondary | ICD-10-CM | POA: Diagnosis not present

## 2020-10-15 ENCOUNTER — Ambulatory Visit: Payer: Medicaid Other | Admitting: Family

## 2020-10-15 DIAGNOSIS — I509 Heart failure, unspecified: Secondary | ICD-10-CM | POA: Diagnosis not present

## 2020-10-16 DIAGNOSIS — I509 Heart failure, unspecified: Secondary | ICD-10-CM | POA: Diagnosis not present

## 2020-10-17 DIAGNOSIS — I509 Heart failure, unspecified: Secondary | ICD-10-CM | POA: Diagnosis not present

## 2020-10-18 DIAGNOSIS — I509 Heart failure, unspecified: Secondary | ICD-10-CM | POA: Diagnosis not present

## 2020-10-21 DIAGNOSIS — I509 Heart failure, unspecified: Secondary | ICD-10-CM | POA: Diagnosis not present

## 2020-10-22 ENCOUNTER — Emergency Department
Admission: EM | Admit: 2020-10-22 | Discharge: 2020-10-22 | Disposition: A | Discharge status: Home / Self Care | Payer: Medicaid Other | Attending: Emergency Medicine | Admitting: Emergency Medicine

## 2020-10-22 ENCOUNTER — Emergency Department: Payer: Medicaid Other

## 2020-10-22 ENCOUNTER — Other Ambulatory Visit: Payer: Self-pay

## 2020-10-22 DIAGNOSIS — M25551 Pain in right hip: Secondary | ICD-10-CM | POA: Diagnosis not present

## 2020-10-22 DIAGNOSIS — E119 Type 2 diabetes mellitus without complications: Secondary | ICD-10-CM | POA: Diagnosis not present

## 2020-10-22 DIAGNOSIS — J45909 Unspecified asthma, uncomplicated: Secondary | ICD-10-CM | POA: Insufficient documentation

## 2020-10-22 DIAGNOSIS — M25511 Pain in right shoulder: Secondary | ICD-10-CM | POA: Diagnosis not present

## 2020-10-22 DIAGNOSIS — G8929 Other chronic pain: Secondary | ICD-10-CM | POA: Diagnosis not present

## 2020-10-22 DIAGNOSIS — I110 Hypertensive heart disease with heart failure: Secondary | ICD-10-CM | POA: Diagnosis not present

## 2020-10-22 DIAGNOSIS — E039 Hypothyroidism, unspecified: Secondary | ICD-10-CM | POA: Diagnosis not present

## 2020-10-22 DIAGNOSIS — I4891 Unspecified atrial fibrillation: Secondary | ICD-10-CM | POA: Insufficient documentation

## 2020-10-22 DIAGNOSIS — I509 Heart failure, unspecified: Secondary | ICD-10-CM | POA: Diagnosis not present

## 2020-10-22 DIAGNOSIS — R52 Pain, unspecified: Secondary | ICD-10-CM | POA: Diagnosis not present

## 2020-10-22 DIAGNOSIS — R069 Unspecified abnormalities of breathing: Secondary | ICD-10-CM | POA: Diagnosis not present

## 2020-10-22 DIAGNOSIS — J441 Chronic obstructive pulmonary disease with (acute) exacerbation: Secondary | ICD-10-CM | POA: Diagnosis not present

## 2020-10-22 DIAGNOSIS — Z7982 Long term (current) use of aspirin: Secondary | ICD-10-CM | POA: Insufficient documentation

## 2020-10-22 DIAGNOSIS — I5043 Acute on chronic combined systolic (congestive) and diastolic (congestive) heart failure: Secondary | ICD-10-CM | POA: Insufficient documentation

## 2020-10-22 DIAGNOSIS — M79603 Pain in arm, unspecified: Secondary | ICD-10-CM | POA: Diagnosis not present

## 2020-10-22 DIAGNOSIS — Z79899 Other long term (current) drug therapy: Secondary | ICD-10-CM | POA: Diagnosis not present

## 2020-10-22 DIAGNOSIS — R0689 Other abnormalities of breathing: Secondary | ICD-10-CM | POA: Diagnosis not present

## 2020-10-22 DIAGNOSIS — I2510 Atherosclerotic heart disease of native coronary artery without angina pectoris: Secondary | ICD-10-CM | POA: Insufficient documentation

## 2020-10-22 DIAGNOSIS — Z7901 Long term (current) use of anticoagulants: Secondary | ICD-10-CM | POA: Insufficient documentation

## 2020-10-22 DIAGNOSIS — M1611 Unilateral primary osteoarthritis, right hip: Secondary | ICD-10-CM | POA: Diagnosis not present

## 2020-10-22 DIAGNOSIS — Z7951 Long term (current) use of inhaled steroids: Secondary | ICD-10-CM | POA: Diagnosis not present

## 2020-10-22 LAB — PROTIME-INR
INR: 2.4 — ABNORMAL HIGH (ref 0.8–1.2)
Prothrombin Time: 26.1 seconds — ABNORMAL HIGH (ref 11.4–15.2)

## 2020-10-22 LAB — CBC WITH DIFFERENTIAL/PLATELET
Abs Immature Granulocytes: 0.04 10*3/uL (ref 0.00–0.07)
Basophils Absolute: 0.1 10*3/uL (ref 0.0–0.1)
Basophils Relative: 1 %
Eosinophils Absolute: 0.1 10*3/uL (ref 0.0–0.5)
Eosinophils Relative: 1 %
HCT: 31.5 % — ABNORMAL LOW (ref 39.0–52.0)
Hemoglobin: 10.5 g/dL — ABNORMAL LOW (ref 13.0–17.0)
Immature Granulocytes: 0 %
Lymphocytes Relative: 10 %
Lymphs Abs: 0.9 10*3/uL (ref 0.7–4.0)
MCH: 30.9 pg (ref 26.0–34.0)
MCHC: 33.3 g/dL (ref 30.0–36.0)
MCV: 92.6 fL (ref 80.0–100.0)
Monocytes Absolute: 1.1 10*3/uL — ABNORMAL HIGH (ref 0.1–1.0)
Monocytes Relative: 12 %
Neutro Abs: 6.9 10*3/uL (ref 1.7–7.7)
Neutrophils Relative %: 76 %
Platelets: 230 10*3/uL (ref 150–400)
RBC: 3.4 MIL/uL — ABNORMAL LOW (ref 4.22–5.81)
RDW: 16.1 % — ABNORMAL HIGH (ref 11.5–15.5)
WBC: 9 10*3/uL (ref 4.0–10.5)
nRBC: 0.2 % (ref 0.0–0.2)

## 2020-10-22 LAB — BASIC METABOLIC PANEL
Anion gap: 10 (ref 5–15)
BUN: 6 mg/dL (ref 6–20)
CO2: 32 mmol/L (ref 22–32)
Calcium: 7.9 mg/dL — ABNORMAL LOW (ref 8.9–10.3)
Chloride: 92 mmol/L — ABNORMAL LOW (ref 98–111)
Creatinine, Ser: 0.62 mg/dL (ref 0.61–1.24)
GFR, Estimated: >60 mL/min (ref >60)
Glucose, Bld: 130 mg/dL — ABNORMAL HIGH (ref 70–99)
Potassium: 3.1 mmol/L — ABNORMAL LOW (ref 3.5–5.1)
Sodium: 134 mmol/L — ABNORMAL LOW (ref 135–145)

## 2020-10-22 MED ORDER — SPIRONOLACTONE 25 MG PO TABS: 12.5000 mg | ORAL_TABLET | Freq: Every day | ORAL | Status: DC

## 2020-10-22 MED ORDER — AMIODARONE HCL 200 MG PO TABS: 200.0000 mg | ORAL_TABLET | Freq: Two times a day (BID) | ORAL | Status: DC

## 2020-10-22 MED ORDER — LEVOTHYROXINE SODIUM 50 MCG PO TABS
75.0000 ug | ORAL_TABLET | Freq: Every day | ORAL | Status: DC
  Administered 2020-10-22: 75 ug via ORAL
  Filled 2020-10-22: qty 2

## 2020-10-22 MED ORDER — FENTANYL CITRATE PF 50 MCG/ML IJ SOSY
50.0000 ug | PREFILLED_SYRINGE | Freq: Once | INTRAMUSCULAR | Status: AC
  Administered 2020-10-22: 50 ug via INTRAVENOUS
  Filled 2020-10-22: qty 1

## 2020-10-22 MED ORDER — OXYCODONE-ACETAMINOPHEN 5-325 MG PO TABS: 2.0000 | ORAL_TABLET | Freq: Three times a day (TID) | ORAL | 0 refills | Status: DC | PRN

## 2020-10-22 MED ORDER — NAPHAZOLINE-GLYCERIN 0.012-0.25 % OP SOLN
1.0000 [drp] | Freq: Four times a day (QID) | OPHTHALMIC | Status: DC | PRN
  Filled 2020-10-22: qty 15

## 2020-10-22 MED ORDER — DULOXETINE HCL 20 MG PO CPEP
20.0000 mg | ORAL_CAPSULE | Freq: Every day | ORAL | Status: DC
  Administered 2020-10-22: 20 mg via ORAL
  Filled 2020-10-22: qty 1

## 2020-10-22 MED ORDER — ALBUTEROL SULFATE (2.5 MG/3ML) 0.083% IN NEBU: 2.5000 mg | INHALATION_SOLUTION | RESPIRATORY_TRACT | Status: DC | PRN

## 2020-10-22 MED ORDER — ASPIRIN EC 81 MG PO TBEC
81.0000 mg | DELAYED_RELEASE_TABLET | Freq: Every day | ORAL | Status: DC
  Administered 2020-10-22: 81 mg via ORAL
  Filled 2020-10-22: qty 1

## 2020-10-22 MED ORDER — ATORVASTATIN CALCIUM 20 MG PO TABS
40.0000 mg | ORAL_TABLET | Freq: Every day | ORAL | Status: DC
  Administered 2020-10-22: 40 mg via ORAL
  Filled 2020-10-22: qty 2

## 2020-10-22 MED ORDER — OXYCODONE-ACETAMINOPHEN 5-325 MG PO TABS: 2.0000 | ORAL_TABLET | Freq: Three times a day (TID) | ORAL | Status: DC | PRN

## 2020-10-22 MED ORDER — ONDANSETRON HCL 4 MG/2ML IJ SOLN
4.0000 mg | Freq: Once | INTRAMUSCULAR | Status: AC
  Administered 2020-10-22: 4 mg via INTRAVENOUS
  Filled 2020-10-22: qty 2

## 2020-10-22 MED ORDER — CARVEDILOL 6.25 MG PO TABS
6.2500 mg | ORAL_TABLET | Freq: Two times a day (BID) | ORAL | Status: DC
  Administered 2020-10-22: 6.25 mg via ORAL
  Filled 2020-10-22: qty 1

## 2020-10-22 MED ORDER — SACUBITRIL-VALSARTAN 24-26 MG PO TABS: 1.0000 | ORAL_TABLET | Freq: Two times a day (BID) | ORAL | Status: DC

## 2020-10-22 MED ORDER — TORSEMIDE 20 MG PO TABS
40.0000 mg | ORAL_TABLET | Freq: Every day | ORAL | Status: DC
  Administered 2020-10-22: 40 mg via ORAL
  Filled 2020-10-22: qty 2

## 2020-10-22 MED ORDER — MONTELUKAST SODIUM 10 MG PO TABS: 10.0000 mg | ORAL_TABLET | Freq: Every day | ORAL | Status: DC

## 2020-10-22 MED ORDER — ALLOPURINOL 100 MG PO TABS: 100.0000 mg | ORAL_TABLET | Freq: Every day | ORAL | Status: DC

## 2020-10-22 MED ORDER — WARFARIN SODIUM 1 MG PO TABS: 1.0000 mg | ORAL_TABLET | Freq: Every day | ORAL | Status: DC

## 2020-10-22 MED ORDER — TRAZODONE HCL 50 MG PO TABS: 150.0000 mg | ORAL_TABLET | Freq: Every day | ORAL | Status: DC

## 2020-10-22 MED ORDER — FLUTICASONE-UMECLIDIN-VILANT 100-62.5-25 MCG/INH IN AEPB: 1.0000 | INHALATION_SPRAY | Freq: Every day | RESPIRATORY_TRACT | Status: DC

## 2020-10-22 MED ORDER — ADULT MULTIVITAMIN W/MINERALS CH
1.0000 | ORAL_TABLET | Freq: Every day | ORAL | Status: DC
  Administered 2020-10-22: 1 via ORAL
  Filled 2020-10-22: qty 1

## 2020-10-22 MED ORDER — WARFARIN SODIUM 6 MG PO TABS: 6.0000 mg | ORAL_TABLET | Freq: Every day | ORAL | Status: DC

## 2020-10-22 MED ORDER — DICLOFENAC SODIUM 1 % EX GEL
2.0000 g | Freq: Every day | CUTANEOUS | Status: DC | PRN
  Filled 2020-10-22: qty 100

## 2020-10-22 MED ORDER — COLCHICINE 0.6 MG PO TABS: 0.6000 mg | ORAL_TABLET | Freq: Every day | ORAL | Status: DC

## 2020-10-22 MED ORDER — MORPHINE SULFATE (PF) 4 MG/ML IV SOLN
4.0000 mg | Freq: Once | INTRAVENOUS | Status: AC
  Administered 2020-10-22: 4 mg via INTRAVENOUS
  Filled 2020-10-22: qty 1

## 2020-10-22 MED ORDER — ALBUTEROL SULFATE HFA 108 (90 BASE) MCG/ACT IN AERS: 2.0000 | INHALATION_SPRAY | RESPIRATORY_TRACT | Status: DC | PRN

## 2020-10-22 MED ORDER — ONDANSETRON 4 MG PO TBDP: 4.0000 mg | ORAL_TABLET | Freq: Three times a day (TID) | ORAL | Status: DC | PRN
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
## 2020-10-22 NOTE — TOC Progression Note (Addendum)
Transition of Care [facility]) - Progression Note    Patient Details  Name: [name] MRN: [id number] Date of Birth: 1960/04/13  Transition of [facility]) CM/SW Contact  [name] Phone Number: [phone] 10/22/2020, 10:07 AM  Clinical Narrative:     CSW called and left voicemail for [facility] supervisor [phone].  CSW spoke with Diona Fanti [phone] w/ [facility], she stated she spoke with Oley Balm at [facility] and requested they transport the patient home. She did not receive a transportation confirmation from Ms Maryclare Bean.  Ms. Maryclare Bean told Ms. Jones she would contact someone in [facility] about it.    Barriers to Discharge: No Barriers Identified  Expected Discharge Plan and Services                                                 Social Determinants of Health (SDOH) Interventions    Readmission Risk Interventions Readmission Risk Prevention Plan 01/11/2020 12/24/2019 01/25/2019  Transportation Screening Complete Complete Complete  PCP or Specialist Appt within 3-5 Days - - -  HRI or Home Care Consult - - -  Social Work Consult for Recovery Care Planning/Counseling - - -  Palliative Care Screening - - -  Medication Review [facility]) Complete Complete Complete  PCP or Specialist appointment within 3-5 days of discharge Complete Complete Complete  HRI or Home Care Consult - - Complete  SW Recovery Care/Counseling Consult Complete Complete Complete  Palliative Care Screening Complete Not Applicable Not Applicable  Skilled Nursing Facility Not Applicable Not Applicable Not Applicable  Some recent data might be hidden

## 2020-10-22 NOTE — TOC Transition Note (Signed)
Transition of Care [facility]) - Progression Note    Patient Details  Name: [name] [name] MRN: [id number] Date of Birth: 1960-09-19  Transition of Care [facility]) CM/SW Contact  [name] Phone Number: [phone] 10/22/2020, 3:11 PM  Clinical Narrative:     CSW spoke with Katheran James Supervisor [phone], Llana Aliment and Dr. Fanny Bien (EDP) on concerns about EMS transportation for patient and transportation for patient going forward.  For this ED hospital stay CSW called [facility] and they were unable to find an agency who would be able to safely transport the patient. CSW contacted Ray w/ [facility] and he stated he would transport patient back home today.  CSW contacted Diona Fanti [phone] to update her on patient transportation home.  EDP/ED Staff updated.    Barriers to Discharge: No Barriers Identified  Expected Discharge Plan and Services                                                 Social Determinants of Health (SDOH) Interventions    Readmission Risk Interventions Readmission Risk Prevention Plan 01/11/2020 12/24/2019 01/25/2019  Transportation Screening Complete Complete Complete  PCP or Specialist Appt within 3-5 Days - - -  HRI or Home Care Consult - - -  Social Work Consult for Recovery Care Planning/Counseling - - -  Palliative Care Screening - - -  Medication Review Oceanographer) Complete Complete Complete  PCP or Specialist appointment within 3-5 days of discharge Complete Complete Complete  HRI or Home Care Consult - - Complete  SW Recovery Care/Counseling Consult Complete Complete Complete  Palliative Care Screening Complete Not Applicable Not Applicable  Skilled Nursing Facility Not Applicable Not Applicable Not Applicable  Some recent data might be hidden

## 2020-10-22 NOTE — TOC Progression Note (Addendum)
Transition of Care [facility]) - Progression Note    Patient Details  Name: Epic Tribbett MRN: [id number] Date of Birth: [birth year]  Transition of Care [facility]) CM/SW Contact  [name] Phone Number: [phone] 10/22/2020, 8:39 AM  Clinical Narrative:     CSW spoke with Diona Fanti [phone] w/ [facility]about patient's current status and readiness for discharge.  CSW asked Ms. Yetta Barre to contact [facility] and speak with them to pick up patient since [facility] is following patient.  Ms. Yetta Barre stated she would contact [facility] and  request patient transport home, and she'll call this CSW w/ update.  CSW still pending return call from [facility] supervisor.    Barriers to Discharge: No Barriers Identified  Expected Discharge Plan and Services                                                 Social Determinants of Health (SDOH) Interventions    Readmission Risk Interventions Readmission Risk Prevention Plan 01/11/2020 12/24/2019 01/25/2019  Transportation Screening Complete Complete Complete  PCP or Specialist Appt within 3-5 Days - - -  HRI or Home Care Consult - - -  Social Work Consult for Recovery Care Planning/Counseling - - -  Palliative Care Screening - - -  Medication Review [facility]) Complete Complete Complete  PCP or Specialist appointment within 3-5 days of discharge Complete Complete Complete  HRI or Home Care Consult - - Complete  SW Recovery Care/Counseling Consult Complete Complete Complete  Palliative Care Screening Complete Not Applicable Not Applicable  Skilled Nursing Facility Not Applicable Not Applicable Not Applicable  Some recent data might be hidden
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
## 2020-10-22 NOTE — Progress Notes (Signed)
[facility] ED [facility] Hospital Liaison note: ? ?This patient is currently enrolled in [facility] outpatient-based Palliative Care. Will continue to follow for disposition. ? ?Please call with any outpatient palliative questions or concerns. ? ?Thank you, ?Dee Curry, LPN ?[facility] Hospital Liaison ?[phone] ?

## 2020-10-22 NOTE — Progress Notes (Signed)
PT Cancellation Note  Patient Details Name: [name] [name] MRN: [id number] DOB: 06-24-1960   Cancelled Treatment:     . Consult received and chart reviewed. Discussed with TOC. Pt with pending dc to home environment. Pt well known to therapy services. No further needs identified at this time. Will dc in house.   Raynald Rouillard 10/22/2020, 2:30 PM Elizabeth Palau, PT, DPT [phone]

## 2020-10-22 NOTE — ED Notes (Signed)
Called [facility] to transport patient back home.

## 2020-10-23 ENCOUNTER — Telehealth: Payer: Self-pay

## 2020-10-23 DIAGNOSIS — I509 Heart failure, unspecified: Secondary | ICD-10-CM | POA: Diagnosis not present

## 2020-10-23 NOTE — Telephone Encounter (Signed)
Follow up hospital call made by RN.  No questions regarding recent ER visit or their directions.  Follow up appt made with palliative care NP for Tuesday 9/13 at 2 pm

## 2020-10-23 NOTE — Telephone Encounter (Signed)
Home health nursing ordered.  Patient will likely need office visit to proceed w/ services once initiated  Saralyn Pilar, DO [facility] 10/23/2020, 1:19 PM

## 2020-10-23 NOTE — Telephone Encounter (Signed)
Copied from CRM [id number]. Topic: Referral - Request for Referral >> Oct 21, 2020  3:06 PM Marylen Ponto wrote: Has patient seen PCP for this complaint? Yes.   *If NO, is insurance requiring patient see PCP for this issue before PCP can refer them? Referral for which specialty: Home Health Services Nursing Preferred provider/office: no specific organization Reason for referral: Social worker Diona Fanti requests Home Health Services due to sores on pt legs  Diona Fanti at [phone]

## 2020-10-24 DIAGNOSIS — I509 Heart failure, unspecified: Secondary | ICD-10-CM | POA: Diagnosis not present

## 2020-10-25 DIAGNOSIS — I509 Heart failure, unspecified: Secondary | ICD-10-CM | POA: Diagnosis not present

## 2020-10-26 DIAGNOSIS — I509 Heart failure, unspecified: Secondary | ICD-10-CM | POA: Diagnosis not present

## 2020-10-27 ENCOUNTER — Telehealth: Payer: Self-pay | Admitting: Family Medicine

## 2020-10-27 DIAGNOSIS — I509 Heart failure, unspecified: Secondary | ICD-10-CM | POA: Diagnosis not present

## 2020-10-27 NOTE — Telephone Encounter (Signed)
He was recently hospitalized  He cannot get home health initiated until he is seen here in person for a face to face visit. They are asking to initiate wound care for him and nursing care, but will require face to face office note first.  Saralyn Pilar, DO [facility] 10/27/2020, 5:36 PM

## 2020-10-28 ENCOUNTER — Ambulatory Visit: Payer: Medicaid Other | Admitting: Physician Assistant

## 2020-10-28 ENCOUNTER — Other Ambulatory Visit: Payer: Self-pay

## 2020-10-28 ENCOUNTER — Other Ambulatory Visit (HOSPITAL_COMMUNITY): Payer: Self-pay

## 2020-10-28 ENCOUNTER — Other Ambulatory Visit: Payer: Medicaid Other | Admitting: Primary Care

## 2020-10-28 DIAGNOSIS — I509 Heart failure, unspecified: Secondary | ICD-10-CM | POA: Diagnosis not present

## 2020-10-28 NOTE — Telephone Encounter (Signed)
FYI Update on this situation.  Patient is unable to come into office right now. He will be seen by Palliative Care - Paulina Fusi NP for a home face to face visit to qualify for home health.  Saralyn Pilar, DO [facility] 10/28/2020, 4:47 PM

## 2020-10-29 DIAGNOSIS — I509 Heart failure, unspecified: Secondary | ICD-10-CM | POA: Diagnosis not present

## 2020-10-29 NOTE — Progress Notes (Signed)
Had a home visit with Chrissie Noa.  Met with the [employer] at this visit.  Shmiel says he is doing ok, struggling being at home.  He states been able to get from bed to his electric wheelchair and into his recliner but really hard.  He states pain is bad, he has 4 pain pills left.  He has appt with ortho tomorrow.  He states he is able to get his self cleaned up and dressed.  I am not sure if he is able to, he sits around with no under clothes on to be able to use urinal without standing.  He has a t shirt on and that's it.  His sister in law came by while we were there, she states was cleaning his home for him but has got to the point where she is unable to because of the smell, urine and feces in bed and floor.  She states his rrom mate will not help in any way.  She came by just to take the trash out for him.  He states he knows he needs skilled nursing and agreeing now.  He wants to go to [place] near his sister but Education officer, museum has sent out 9 fl2 and no response yet of acceptance.  He is willing to go to [facility] or [facility] locally til able to get to [place].  He is aware of them taking his check except for a small amount.  He needs a visit with PCP for home health referral, spoke with Palliative Care nurse and she states she can order it without him going there.  She scheduled an appt for Thursday for home visit.  He has his medications, he is taking them by bottles and will switch to pill packs.  Hopefully can get him placed quickly.  Will continue to visit.   [place] [phone]

## 2020-10-30 ENCOUNTER — Other Ambulatory Visit: Payer: Medicaid Other | Admitting: Primary Care

## 2020-10-30 ENCOUNTER — Other Ambulatory Visit (HOSPITAL_COMMUNITY): Payer: Self-pay

## 2020-10-30 ENCOUNTER — Other Ambulatory Visit: Payer: Self-pay

## 2020-10-30 DIAGNOSIS — Z515 Encounter for palliative care: Secondary | ICD-10-CM

## 2020-10-30 DIAGNOSIS — Z7409 Other reduced mobility: Secondary | ICD-10-CM

## 2020-10-30 DIAGNOSIS — Z6841 Body Mass Index (BMI) 40.0 and over, adult: Secondary | ICD-10-CM

## 2020-10-30 DIAGNOSIS — G8929 Other chronic pain: Secondary | ICD-10-CM

## 2020-10-30 DIAGNOSIS — L258 Unspecified contact dermatitis due to other agents: Secondary | ICD-10-CM | POA: Diagnosis not present

## 2020-10-30 DIAGNOSIS — R32 Unspecified urinary incontinence: Secondary | ICD-10-CM | POA: Diagnosis not present

## 2020-10-30 DIAGNOSIS — E662 Morbid (severe) obesity with alveolar hypoventilation: Secondary | ICD-10-CM | POA: Diagnosis not present

## 2020-10-30 DIAGNOSIS — I509 Heart failure, unspecified: Secondary | ICD-10-CM | POA: Diagnosis not present

## 2020-10-30 DIAGNOSIS — M25551 Pain in right hip: Secondary | ICD-10-CM | POA: Diagnosis not present

## 2020-10-30 DIAGNOSIS — Z789 Other specified health status: Secondary | ICD-10-CM

## 2020-10-30 NOTE — Progress Notes (Signed)
Today had a home visit with Palliative NP and county APS worker.  He did not show for his ortho appt he stated he could not get his self cleaned or dressed.  He is slumped down in his recliner and unable to stand.  He admits to alcohol and even took a shot when we were there. They have not found placement for him yet.  He states has 1 pain pill left and has to drink for pain.  Once looked at his meds he has a bottle full of pain meds, more than prescribed.  He states not to ask about them and not to look inside the bottle, but it is full.  NP evaluated him for Home health and will place referral.  Her and the APS worker are working together to get placement quicker.  He refuses to go to ED, he advised can not stand.  Finally he agreed I could call for help to assist him up into his recliner.  Help came and we assisted with 5 people to get him up in his recliner where he is now in a sitting position.  Advised him again if he can not stand he needs to go back to ED.  He refuses.  He stats he will be fine.  Room mate is in his room but ken advises he does not help him any.  Will continue to visit and do what I can to help.  He refuses to go in person to any visits even when I advised him he could go by Ambulance.    [name] EMT-Paramedic [phone]

## 2020-10-30 NOTE — Progress Notes (Signed)
[facility] Consult Note Telephone: [phone]  Fax: [phone]    Date of encounter: 10/30/20 3:22 PM PATIENT NAME: [name] [name] [address] [place] [address]   [phone] (home)  DOB: November 26, 1960 MRN: [id number] PRIMARY CARE PROVIDER:    [name], DO,  [place] [address] [phone]  REFERRING PROVIDER:   [name], DO [address] [place],  [place] [address] [phone]  RESPONSIBLE PARTY:    Contact Information     Name Relation Home Work [place] Sister   [phone]   [place] Amy Friend [phone]  [phone]       I met face to face with patient and EMS representative and DSS  APS SW in patient's home.  Face to face meeting was to assess needs for home health and DME. Palliative Care was asked to follow this patient by consultation request of  Nobie Putnam * to address advance care planning and complex medical decision making. This is a follow up visit.                                   ASSESSMENT AND PLAN / RECOMMENDATIONS:   Advance Care Planning/Goals of Care: Goals include to maximize quality of life and symptom management. Our advance care planning conversation included a discussion about:    The value and importance of advance care planning  Exploration of goals of care in the event of a sudden injury or illness. CODE STATUS: FULL Will continue to review at subsequent visits  Symptom Management/Plan:  Order for Home Health: Nursing for medication management, wound management. Disease and safety teaching.  DME: Bariatric hospital bed  is medically necessary due to inability to change body position due to body habitus and pain. Patient cannot change positions without mechanical device.  Skin Impairment: Patient endorses bilateral thigh /buttock wounds and sacral wounds. I was able to partially visualize  large and  multiple thigh wounds, which appear superficial with yellow sloughing likely from incontinence and friction. Patient states he is unable to care for wounds due to being chair dependent. He recently has established care with home health for [facility] services for 6 x/wk at 2 hours a day thought PCS worker is not able to apply treatments. Patient is unable to shift weight and during visit , EMS was called to assist patient from falling from chair due to inability to support or maneuver  himself.  Pain: Patient endorses chronic pain has seen pain management in the past; has two percocet prescriptions on hand today, was schedule to see [facility] but missed appt today due to immobility and lack of transportation , endorses need for multiple joint replacements however per previous consultations was deemed not a candidate for surgery due to co-morbidities.Patient has on hand oxycodone 7.5 mg tablets. Encouraged patient to use medication available at this time. He will need to follow with pain management clinic or Ortho for further pain needs.  Immobility: Pain, SOB, and arthritis cause extreme immobility,patient has to be lifted for mobilization.Often has incontinent episodes in floor of his home due to immobility. Patient has lift chair could benefit  from hospital bed and or hoyer lift if someone was present to operate. Patient still within window for skilled nursing services and I  recommend stay related to increased physical needs and limitations from chronic heart  failure, immobility and pain. I will complete FL2 on behalf of patient for DSS to pursue placement. Patient agreeable at this time. DSS has current referral to CAP program pending.   Coordination of services: DSS APS SW is present and will f/u to find out if pt is eligible for medicare based on 6 year disability. He moved to [place] several years ago and may have been lost to follow up in the [facility] area.  Seeking placement in LTC in [place] or in  [place].  Follow up Palliative Care Visit: Palliative care will continue to follow for complex medical decision making, advance care planning, and clarification of goals. Return 12 weeks or prn.  I spent 60 minutes providing this consultation. More than 50% of the time in this consultation was spent in counseling and care coordination.  PPS: 40%  HOSPICE ELIGIBILITY/DIAGNOSIS: TBD  Chief Complaint: Wounds; Immobility  HISTORY OF PRESENT ILLNESS:  [name] is a 60 y.o. year old male with morbid obesity, CHF, atrial fibrillation on Coumadin, COPD on 3 L nasal cannula, sleep apnea, chronic pain who is seen today for a face to face to establish home health care for wound management. Patient is dependent for all ADLS and has limited resources from a roommate and a sister that lives in [place]. He is currently provided 2 hours of CNA care 6 days per week per DSS.   History obtained from review of EMR, discussion with primary team, and interview with family, facility staff/caregiver and/or Mr. Authement.  I reviewed available labs, medications, imaging, studies and related documents from the EMR.  Records reviewed and summarized above.   ROS  General: NAD EYES: denies vision changes ENMT: denies dysphagia Cardiovascular: denies chest pain, denies DOE Pulmonary: denies cough, denies increased SOB Abdomen: endorses good appetite, denies constipation, endorses incontinence of bowel GU: denies dysuria, endorses continence of urine MSK: endorses generalized weakness,  no recent falls reported Skin: endorses wounds to bilateral posterior thighs and sacrum Neurological: endorses right shoulder, right hip, and left knee pain, denies insomnia Psych: Endorses positive mood Heme/lymph/immuno: denies bruises, abnormal bleeding  Physical Exam: Current and past weights: 407  lbs current weight Constitutional: NAD General: obese  EYES: anicteric sclera, lids intact, no discharge  ENMT: intact  hearing, oral mucous membranes moist, dentition intact CV: S1S2, irregular rhythm, regular rate, +2 LE edema Pulmonary: LCTA, no increased work of breathing, no cough, 3 LPM [place]  Abdomen: intake 100%, BS + 4 quadrants, soft and non tender, no ascites GU: deferred MSK: no sarcopenia, limited ROM in all extremities, non-ambulatory Skin: warm and dry, + bilateral thigh wounds on visible skin Neuro:  + generalized weakness,  mild  cognitive impairment Psych: non-anxious affect, A and O x 3 Hem/lymph/immuno: no widespread bruising  Patient Active Problem List   Diagnosis Date Noted   COPD exacerbation ([place]) 10/09/2020   Hypotension 09/26/2020   Hyponasality 09/25/2020   Hypomagnesemia 09/25/2020   Acute on chronic combined systolic and diastolic CHF (congestive heart failure) ([place]) 09/23/2020   Subtherapeutic anticoagulation    Atrial fibrillation ([place]) 09/08/2020   CHF (congestive heart failure), NYHA class II, unspecified failure chronicity, systolic ([place]) 71/69/6789   Acute on chronic systolic CHF (congestive heart failure) ([place]) 08/30/2020   Abdominal pain 08/30/2020   Alcohol dependence with unspecified alcohol-induced disorder (HCC)    Dyspnea and respiratory abnormality 05/04/2020   Clostridium difficile diarrhea 05/04/2020   Elevated lactic acid level 05/04/2020   Unable to care for self 05/04/2020  Weakness 05/02/2020   UTI (urinary tract infection) 16/11/9602   Chronic systolic CHF (congestive heart failure) ([place]) 04/30/2020   Hypokalemia 04/30/2020   Chronic respiratory failure ([place]) 04/30/2020   Physical deconditioning 04/30/2020   Respiratory arrest ([place]) 04/06/2020   Weakness of both lower extremities 03/26/2020   CHF (congestive heart failure) ([place]) 01/27/2020   Shortness of breath 12/22/2019   Atrial fibrillation, chronic ([place]) 12/22/2019   Class 3 obesity with alveolar hypoventilation, serious comorbidity, and body mass index (BMI) of 60.0 to 69.9 in adult ([place])  12/22/2019   GAD (generalized anxiety disorder) 10/12/2019   Second degree burn of left forearm 09/26/2019   Alcohol withdrawal syndrome without complication (HCC)    Lower extremity ulceration, unspecified laterality, with fat layer exposed ([place]) 08/09/2019   Alcohol abuse    Pressure ulcer 06/26/2019   Iron deficiency anemia 06/22/2019   Depression 06/22/2019   Elevated troponin 06/22/2019   Left-sided Bell's palsy 05/08/2019   Chronic intractable headache 05/03/2019   Noncompliance by refusing service 05/03/2019   Facial droop 05/02/2019   Pharmacologic therapy 04/11/2019   Disorder of skeletal system 04/11/2019   Problems influencing health status 04/11/2019   Chronic anticoagulation (warfarin  COUMADIN) 04/11/2019   Elevated sed rate 04/11/2019   Elevated C-reactive protein (CRP) 04/11/2019   Elevated hemoglobin A1c 04/11/2019   Hypoalbuminemia 04/11/2019   Edema due to hypoalbuminemia 04/11/2019   Elevated brain natriuretic peptide (BNP) level 04/11/2019   Chronic hip pain (Right) 04/11/2019   Osteoarthritis of hip (Right) 04/11/2019   Chronic atrial fibrillation ([place]) 03/08/2019   Atrial fibrillation with rapid ventricular response ([place]) 03/08/2019   Degenerative joint disease of right hip 03/05/2019   Hypothyroidism 03/04/2019   Chronic venous stasis dermatitis of both lower extremities 03/04/2019   Long term (current) use of anticoagulants 02/23/2019   PE (pulmonary thromboembolism) ([place]) 01/21/2019   HLD (hyperlipidemia) 01/21/2019   CAD (coronary artery disease) 01/21/2019   Noncompliance 01/09/2019   Acquired thrombophilia ([place]) 11/28/2018   Major depressive disorder, recurrent episode, in partial remission ([place]) 09/17/2018   Chest pain 08/06/2018   Personal history of DVT (deep vein thrombosis) 07/13/2018   History of pulmonary embolism (on Coumadin) 07/13/2018   Neck pain 07/13/2018   Chronic low back pain (Bilateral)  w/ sciatica (Bilateral) 07/13/2018   TBI  (traumatic brain injury) ([place]) 07/04/2018   Abnormal thyroid blood test 06/27/2018   Type 2 diabetes mellitus with other specified complication ([place]) 54/10/8117   HTN (hypertension) 06/06/2018   Chronic gout involving toe, unspecified cause, unspecified laterality 06/06/2018   Morbid obesity with BMI of 60.0-69.9, adult ([place]) 06/06/2018   Chronic pain syndrome 06/06/2018   Ulcers of both lower extremities, limited to breakdown of skin ([place]) 06/06/2018   Gout 06/06/2018   Diet-controlled diabetes mellitus ([place]) 06/06/2018   Sepsis ([place]) 03/17/2018   Acute on chronic diastolic CHF (congestive heart failure) ([place]) 02/02/2018   Centrilobular emphysema ([place]) 02/02/2018   Obstructive sleep apnea 02/02/2018   Lymphedema 02/02/2018   COPD with acute exacerbation ([place]) 02/02/2018   Acute on chronic respiratory failure with hypoxia and hypercapnia ([place]) 01/27/2018   Acute respiratory failure ([place]) 01/16/2018     Outpatient Encounter Medications as of 10/30/2020  Medication Sig   allopurinol (ZYLOPRIM) 100 MG tablet Take 1 tablet (100 mg total) by mouth daily.   amiodarone (PACERONE) 400 MG tablet Take 0.5 tablets (200 mg total) by mouth 2 (two) times daily.   aspirin EC 81 MG tablet Take 1 tablet (81 mg  total) by mouth daily. Swallow whole.   atorvastatin (LIPITOR) 40 MG tablet Take 1 tablet (40 mg total) by mouth daily.   carvedilol (COREG) 6.25 MG tablet Take 1 tablet (6.25 mg total) by mouth 2 (two) times daily with a meal.   Fluticasone-Umeclidin-Vilant (TRELEGY ELLIPTA) 100-62.5-25 MCG/INH AEPB Inhale 1 Inhaler into the lungs daily.   levothyroxine (SYNTHROID) 75 MCG tablet Take 1 tablet (75 mcg total) by mouth daily before breakfast.   metoprolol succinate (TOPROL-XL) 100 MG 24 hr tablet Take 100 mg by mouth daily. Take with or immediately following a meal.   oxyCODONE-acetaminophen (PERCOCET) 7.5-325 MG tablet Take 1 tablet by mouth every 4 (four) hours as needed for severe pain.    spironolactone (ALDACTONE) 25 MG tablet Take 0.5 tablets (12.5 mg total) by mouth daily.   torsemide (DEMADEX) 20 MG tablet Take 2 tablets (40 mg total) by mouth daily.   warfarin (COUMADIN) 1 MG tablet Take 1 tablet (1 mg total) by mouth daily. Make up to 6 mg daily by using 47m x1 and 1 mg x1 per day   warfarin (COUMADIN) 6 MG tablet Take 1 tablet (6 mg total) by mouth daily.   albuterol (PROVENTIL) (2.5 MG/3ML) 0.083% nebulizer solution Take 3 mLs (2.5 mg total) by nebulization every 4 (four) hours as needed for wheezing or shortness of breath.   albuterol (VENTOLIN HFA) 108 (90 Base) MCG/ACT inhaler Inhale 2 puffs into the lungs every 4 (four) hours as needed for wheezing or shortness of breath.   colchicine 0.6 MG tablet Take 1 tablet (0.6 mg total) by mouth daily. (Patient not taking: Reported on 10/30/2020)   diclofenac Sodium (VOLTAREN) 1 % GEL Apply 2-4 g topically daily as needed (pain). (Patient not taking: Reported on 10/30/2020)   DULoxetine (CYMBALTA) 20 MG capsule Take 1 capsule (20 mg total) by mouth daily. (Patient not taking: Reported on 10/30/2020)   guaiFENesin-dextromethorphan (ROBITUSSIN DM) 100-10 MG/5ML syrup Take 5 mLs by mouth every 4 (four) hours as needed for cough. (Patient not taking: Reported on 10/30/2020)   montelukast (SINGULAIR) 10 MG tablet Take 1 tablet (10 mg total) by mouth at bedtime. (Patient not taking: Reported on 10/30/2020)   Multiple Vitamin (MULTIVITAMIN WITH MINERALS) TABS tablet Take 1 tablet by mouth daily. (Patient not taking: Reported on 10/30/2020)   naphazoline-glycerin (CLEAR EYES REDNESS) 0.012-0.2 % SOLN Place 1-2 drops into both eyes 4 (four) times daily as needed for eye irritation. (Patient not taking: Reported on 10/30/2020)   nitroGLYCERIN (NITROSTAT) 0.4 MG SL tablet Place 1 tablet under tongue every 5 minutes as needed for chest pain. (No more than 3 doses within 15 minutes)   oxyCODONE-acetaminophen (PERCOCET) 5-325 MG tablet Take 2 tablets by  mouth every 8 (eight) hours as needed for severe pain. (Patient not taking: Reported on 10/30/2020)   sacubitril-valsartan (ENTRESTO) 24-26 MG Take 1 tablet by mouth 2 (two) times daily. (Patient not taking: Reported on 10/30/2020)   SUMAtriptan (IMITREX) 50 MG tablet Take 50 mg by mouth daily as needed for migraine. May repeat in 2 hours if headache persists or recurs. (Patient not taking: Reported on 10/30/2020)   traZODone (DESYREL) 150 MG tablet Take 150 mg by mouth at bedtime. (Patient not taking: Reported on 10/30/2020)   No facility-administered encounter medications on file as of 10/30/2020.     Thank you for the opportunity to participate in the care of Mr. HCrill  The palliative care team will continue to follow. Please call our office at [phone]  we can be of additional assistance.   Jason Coop, NP   COVID-19 PATIENT SCREENING TOOL Asked and negative response unless otherwise noted:   Have you had symptoms of covid, tested positive or been in contact with someone with symptoms/positive test in the past 5-10 days?

## 2020-10-31 DIAGNOSIS — I509 Heart failure, unspecified: Secondary | ICD-10-CM | POA: Diagnosis not present

## 2020-11-01 DIAGNOSIS — I509 Heart failure, unspecified: Secondary | ICD-10-CM | POA: Diagnosis not present

## 2020-11-02 ENCOUNTER — Inpatient Hospital Stay
Admission: EM | Admit: 2020-11-02 | Discharge: 2020-11-06 | DRG: 190 | Disposition: A | Discharge status: Skilled Nursing Facility | Payer: Medicaid Other | Attending: Internal Medicine | Admitting: Internal Medicine

## 2020-11-02 DIAGNOSIS — Z86718 Personal history of other venous thrombosis and embolism: Secondary | ICD-10-CM

## 2020-11-02 DIAGNOSIS — Z7951 Long term (current) use of inhaled steroids: Secondary | ICD-10-CM

## 2020-11-02 DIAGNOSIS — Z8249 Family history of ischemic heart disease and other diseases of the circulatory system: Secondary | ICD-10-CM

## 2020-11-02 DIAGNOSIS — I110 Hypertensive heart disease with heart failure: Secondary | ICD-10-CM | POA: Diagnosis present

## 2020-11-02 DIAGNOSIS — Z9981 Dependence on supplemental oxygen: Secondary | ICD-10-CM

## 2020-11-02 DIAGNOSIS — Z6841 Body Mass Index (BMI) 40.0 and over, adult: Secondary | ICD-10-CM

## 2020-11-02 DIAGNOSIS — R791 Abnormal coagulation profile: Secondary | ICD-10-CM | POA: Diagnosis not present

## 2020-11-02 DIAGNOSIS — M19011 Primary osteoarthritis, right shoulder: Secondary | ICD-10-CM | POA: Diagnosis present

## 2020-11-02 DIAGNOSIS — E785 Hyperlipidemia, unspecified: Secondary | ICD-10-CM | POA: Diagnosis present

## 2020-11-02 DIAGNOSIS — Z7901 Long term (current) use of anticoagulants: Secondary | ICD-10-CM

## 2020-11-02 DIAGNOSIS — M1611 Unilateral primary osteoarthritis, right hip: Secondary | ICD-10-CM | POA: Diagnosis present

## 2020-11-02 DIAGNOSIS — G8929 Other chronic pain: Secondary | ICD-10-CM | POA: Diagnosis present

## 2020-11-02 DIAGNOSIS — E039 Hypothyroidism, unspecified: Secondary | ICD-10-CM | POA: Diagnosis present

## 2020-11-02 DIAGNOSIS — Z79899 Other long term (current) drug therapy: Secondary | ICD-10-CM

## 2020-11-02 DIAGNOSIS — A082 Adenoviral enteritis: Secondary | ICD-10-CM | POA: Diagnosis present

## 2020-11-02 DIAGNOSIS — I5042 Chronic combined systolic (congestive) and diastolic (congestive) heart failure: Secondary | ICD-10-CM | POA: Diagnosis present

## 2020-11-02 DIAGNOSIS — Z79890 Hormone replacement therapy (postmenopausal): Secondary | ICD-10-CM

## 2020-11-02 DIAGNOSIS — Z86711 Personal history of pulmonary embolism: Secondary | ICD-10-CM

## 2020-11-02 DIAGNOSIS — I428 Other cardiomyopathies: Secondary | ICD-10-CM | POA: Diagnosis present

## 2020-11-02 DIAGNOSIS — F1010 Alcohol abuse, uncomplicated: Secondary | ICD-10-CM | POA: Diagnosis present

## 2020-11-02 DIAGNOSIS — I4821 Permanent atrial fibrillation: Secondary | ICD-10-CM | POA: Diagnosis present

## 2020-11-02 DIAGNOSIS — R531 Weakness: Secondary | ICD-10-CM | POA: Diagnosis present

## 2020-11-02 DIAGNOSIS — J441 Chronic obstructive pulmonary disease with (acute) exacerbation: Principal | ICD-10-CM | POA: Diagnosis present

## 2020-11-02 DIAGNOSIS — Z7982 Long term (current) use of aspirin: Secondary | ICD-10-CM

## 2020-11-02 DIAGNOSIS — I4891 Unspecified atrial fibrillation: Secondary | ICD-10-CM | POA: Diagnosis not present

## 2020-11-02 DIAGNOSIS — J9622 Acute and chronic respiratory failure with hypercapnia: Secondary | ICD-10-CM | POA: Diagnosis present

## 2020-11-02 DIAGNOSIS — J9621 Acute and chronic respiratory failure with hypoxia: Secondary | ICD-10-CM | POA: Diagnosis present

## 2020-11-02 DIAGNOSIS — Z801 Family history of malignant neoplasm of trachea, bronchus and lung: Secondary | ICD-10-CM

## 2020-11-02 DIAGNOSIS — I252 Old myocardial infarction: Secondary | ICD-10-CM

## 2020-11-02 DIAGNOSIS — F419 Anxiety disorder, unspecified: Secondary | ICD-10-CM | POA: Diagnosis present

## 2020-11-02 DIAGNOSIS — I878 Other specified disorders of veins: Secondary | ICD-10-CM | POA: Diagnosis present

## 2020-11-02 DIAGNOSIS — M109 Gout, unspecified: Secondary | ICD-10-CM | POA: Diagnosis present

## 2020-11-02 DIAGNOSIS — I509 Heart failure, unspecified: Secondary | ICD-10-CM | POA: Diagnosis not present

## 2020-11-02 DIAGNOSIS — R0602 Shortness of breath: Secondary | ICD-10-CM | POA: Diagnosis not present

## 2020-11-02 DIAGNOSIS — R5381 Other malaise: Secondary | ICD-10-CM | POA: Diagnosis not present

## 2020-11-02 DIAGNOSIS — G4733 Obstructive sleep apnea (adult) (pediatric): Secondary | ICD-10-CM | POA: Diagnosis present

## 2020-11-02 DIAGNOSIS — I248 Other forms of acute ischemic heart disease: Secondary | ICD-10-CM | POA: Diagnosis present

## 2020-11-02 DIAGNOSIS — Z20822 Contact with and (suspected) exposure to covid-19: Secondary | ICD-10-CM | POA: Diagnosis present

## 2020-11-02 DIAGNOSIS — F32A Depression, unspecified: Secondary | ICD-10-CM | POA: Diagnosis present

## 2020-11-02 NOTE — ED Triage Notes (Signed)
Pt brought in by ems for c/o difficulty breathing that started today; pt c/o right hip and right shoulder pain, chronically

## 2020-11-03 ENCOUNTER — Other Ambulatory Visit: Payer: Self-pay

## 2020-11-03 ENCOUNTER — Encounter: Payer: Self-pay | Admitting: *Deleted

## 2020-11-03 ENCOUNTER — Emergency Department: Payer: Medicaid Other

## 2020-11-03 DIAGNOSIS — F419 Anxiety disorder, unspecified: Secondary | ICD-10-CM | POA: Diagnosis present

## 2020-11-03 DIAGNOSIS — R791 Abnormal coagulation profile: Secondary | ICD-10-CM | POA: Diagnosis not present

## 2020-11-03 DIAGNOSIS — F32A Depression, unspecified: Secondary | ICD-10-CM | POA: Diagnosis present

## 2020-11-03 DIAGNOSIS — F1010 Alcohol abuse, uncomplicated: Secondary | ICD-10-CM | POA: Diagnosis present

## 2020-11-03 DIAGNOSIS — I248 Other forms of acute ischemic heart disease: Secondary | ICD-10-CM | POA: Diagnosis present

## 2020-11-03 DIAGNOSIS — Z20822 Contact with and (suspected) exposure to covid-19: Secondary | ICD-10-CM | POA: Diagnosis present

## 2020-11-03 DIAGNOSIS — R0600 Dyspnea, unspecified: Secondary | ICD-10-CM | POA: Diagnosis not present

## 2020-11-03 DIAGNOSIS — M109 Gout, unspecified: Secondary | ICD-10-CM | POA: Diagnosis present

## 2020-11-03 DIAGNOSIS — G8929 Other chronic pain: Secondary | ICD-10-CM | POA: Diagnosis present

## 2020-11-03 DIAGNOSIS — A082 Adenoviral enteritis: Secondary | ICD-10-CM | POA: Diagnosis present

## 2020-11-03 DIAGNOSIS — I4891 Unspecified atrial fibrillation: Secondary | ICD-10-CM | POA: Diagnosis not present

## 2020-11-03 DIAGNOSIS — I428 Other cardiomyopathies: Secondary | ICD-10-CM | POA: Diagnosis present

## 2020-11-03 DIAGNOSIS — E785 Hyperlipidemia, unspecified: Secondary | ICD-10-CM | POA: Diagnosis present

## 2020-11-03 DIAGNOSIS — I4821 Permanent atrial fibrillation: Secondary | ICD-10-CM | POA: Diagnosis present

## 2020-11-03 DIAGNOSIS — E039 Hypothyroidism, unspecified: Secondary | ICD-10-CM | POA: Diagnosis present

## 2020-11-03 DIAGNOSIS — J9622 Acute and chronic respiratory failure with hypercapnia: Secondary | ICD-10-CM | POA: Diagnosis present

## 2020-11-03 DIAGNOSIS — G4733 Obstructive sleep apnea (adult) (pediatric): Secondary | ICD-10-CM | POA: Diagnosis not present

## 2020-11-03 DIAGNOSIS — I5042 Chronic combined systolic (congestive) and diastolic (congestive) heart failure: Secondary | ICD-10-CM | POA: Diagnosis present

## 2020-11-03 DIAGNOSIS — I878 Other specified disorders of veins: Secondary | ICD-10-CM | POA: Diagnosis present

## 2020-11-03 DIAGNOSIS — J9621 Acute and chronic respiratory failure with hypoxia: Secondary | ICD-10-CM | POA: Diagnosis present

## 2020-11-03 DIAGNOSIS — I517 Cardiomegaly: Secondary | ICD-10-CM | POA: Diagnosis not present

## 2020-11-03 DIAGNOSIS — Z6841 Body Mass Index (BMI) 40.0 and over, adult: Secondary | ICD-10-CM | POA: Diagnosis not present

## 2020-11-03 DIAGNOSIS — I110 Hypertensive heart disease with heart failure: Secondary | ICD-10-CM | POA: Diagnosis present

## 2020-11-03 DIAGNOSIS — J441 Chronic obstructive pulmonary disease with (acute) exacerbation: Secondary | ICD-10-CM | POA: Diagnosis not present

## 2020-11-03 LAB — BASIC METABOLIC PANEL
Anion gap: 13 (ref 5–15)
BUN: 19 mg/dL (ref 6–20)
CO2: 32 mmol/L (ref 22–32)
Calcium: 8 mg/dL — ABNORMAL LOW (ref 8.9–10.3)
Chloride: 89 mmol/L — ABNORMAL LOW (ref 98–111)
Creatinine, Ser: 0.72 mg/dL (ref 0.61–1.24)
GFR, Estimated: >60 mL/min (ref >60)
Glucose, Bld: 107 mg/dL — ABNORMAL HIGH (ref 70–99)
Potassium: 4.6 mmol/L (ref 3.5–5.1)
Sodium: 134 mmol/L — ABNORMAL LOW (ref 135–145)

## 2020-11-03 LAB — BLOOD GAS, ARTERIAL
Acid-Base Excess: 5.7 mmol/L — ABNORMAL HIGH (ref 0.0–2.0)
Bicarbonate: 27.8 mmol/L (ref 20.0–28.0)
Delivery systems: POSITIVE
FIO2: 0.45
O2 Saturation: 99.3 %
Patient temperature: 37
pCO2 arterial: 31 mmHg — ABNORMAL LOW (ref 32.0–48.0)
pH, Arterial: 7.56 — ABNORMAL HIGH (ref 7.350–7.450)
pO2, Arterial: 130 mmHg — ABNORMAL HIGH (ref 83.0–108.0)

## 2020-11-03 LAB — BRAIN NATRIURETIC PEPTIDE: B Natriuretic Peptide: 80.5 pg/mL (ref 0.0–100.0)

## 2020-11-03 LAB — CBC WITH DIFFERENTIAL/PLATELET
Abs Immature Granulocytes: 0.03 10*3/uL (ref 0.00–0.07)
Basophils Absolute: 0.1 10*3/uL (ref 0.0–0.1)
Basophils Relative: 1 %
Eosinophils Absolute: 0.4 10*3/uL (ref 0.0–0.5)
Eosinophils Relative: 6 %
HCT: 35.8 % — ABNORMAL LOW (ref 39.0–52.0)
Hemoglobin: 11.7 g/dL — ABNORMAL LOW (ref 13.0–17.0)
Immature Granulocytes: 0 %
Lymphocytes Relative: 19 %
Lymphs Abs: 1.3 10*3/uL (ref 0.7–4.0)
MCH: 30.3 pg (ref 26.0–34.0)
MCHC: 32.7 g/dL (ref 30.0–36.0)
MCV: 92.7 fL (ref 80.0–100.0)
Monocytes Absolute: 0.7 10*3/uL (ref 0.1–1.0)
Monocytes Relative: 10 %
Neutro Abs: 4.5 10*3/uL (ref 1.7–7.7)
Neutrophils Relative %: 64 %
Platelets: 258 10*3/uL (ref 150–400)
RBC: 3.86 MIL/uL — ABNORMAL LOW (ref 4.22–5.81)
RDW: 16.1 % — ABNORMAL HIGH (ref 11.5–15.5)
WBC: 7 10*3/uL (ref 4.0–10.5)
nRBC: 0 % (ref 0.0–0.2)

## 2020-11-03 LAB — PROTIME-INR
INR: 4.5 (ref 0.8–1.2)
Prothrombin Time: 42.7 seconds — ABNORMAL HIGH (ref 11.4–15.2)

## 2020-11-03 LAB — SARS CORONAVIRUS 2 (TAT 6-24 HRS): SARS Coronavirus 2: NEGATIVE

## 2020-11-03 LAB — TROPONIN I (HIGH SENSITIVITY)
Troponin I (High Sensitivity): 44 ng/L — ABNORMAL HIGH (ref <18)
Troponin I (High Sensitivity): 43 ng/L — ABNORMAL HIGH (ref <18)

## 2020-11-03 MED ORDER — IPRATROPIUM-ALBUTEROL 0.5-2.5 (3) MG/3ML IN SOLN
3.0000 mL | Freq: Once | RESPIRATORY_TRACT | Status: AC
  Administered 2020-11-03: 3 mL via RESPIRATORY_TRACT
  Filled 2020-11-03: qty 3

## 2020-11-03 MED ORDER — ADULT MULTIVITAMIN W/MINERALS CH
1.0000 | ORAL_TABLET | Freq: Every day | ORAL | Status: DC
  Administered 2020-11-03 – 2020-11-06 (×4): 1 via ORAL
  Filled 2020-11-03 (×4): qty 1

## 2020-11-03 MED ORDER — ONDANSETRON HCL 4 MG/2ML IJ SOLN
4.0000 mg | Freq: Once | INTRAMUSCULAR | Status: AC
  Administered 2020-11-03: 4 mg via INTRAVENOUS
  Filled 2020-11-03: qty 2

## 2020-11-03 MED ORDER — SODIUM CHLORIDE 0.9 % IV SOLN
500.0000 mg | Freq: Once | INTRAVENOUS | Status: AC
  Administered 2020-11-03: 500 mg via INTRAVENOUS
  Filled 2020-11-03: qty 500

## 2020-11-03 MED ORDER — MELATONIN 3 MG PO TABS
3.0000 mg | ORAL_TABLET | Freq: Every evening | ORAL | Status: DC | PRN
  Filled 2020-11-03: qty 1

## 2020-11-03 MED ORDER — PREDNISONE 20 MG PO TABS: 60.0000 mg | ORAL_TABLET | Freq: Every day | ORAL | 0 refills | Status: AC

## 2020-11-03 MED ORDER — MELATONIN 5 MG PO TABS
2.5000 mg | ORAL_TABLET | Freq: Every evening | ORAL | Status: DC | PRN
  Administered 2020-11-04 – 2020-11-05 (×2): 2.5 mg via ORAL
  Filled 2020-11-03 (×2): qty 1

## 2020-11-03 MED ORDER — LORAZEPAM 1 MG PO TABS
1.0000 mg | ORAL_TABLET | ORAL | Status: DC | PRN
  Administered 2020-11-05: 1 mg via ORAL
  Administered 2020-11-05 – 2020-11-06 (×2): 2 mg via ORAL
  Filled 2020-11-03: qty 2
  Filled 2020-11-03: qty 4
  Filled 2020-11-03: qty 2

## 2020-11-03 MED ORDER — METHYLPREDNISOLONE SODIUM SUCC 40 MG IJ SOLR
40.0000 mg | Freq: Two times a day (BID) | INTRAMUSCULAR | Status: AC
  Administered 2020-11-03 – 2020-11-06 (×6): 40 mg via INTRAVENOUS
  Filled 2020-11-03 (×6): qty 1

## 2020-11-03 MED ORDER — FOLIC ACID 1 MG PO TABS
1.0000 mg | ORAL_TABLET | Freq: Every day | ORAL | Status: DC
  Administered 2020-11-03 – 2020-11-06 (×4): 1 mg via ORAL
  Filled 2020-11-03 (×4): qty 1

## 2020-11-03 MED ORDER — AZITHROMYCIN 500 MG PO TABS: 500.0000 mg | ORAL_TABLET | Freq: Every day | ORAL | Status: AC

## 2020-11-03 MED ORDER — ALBUTEROL SULFATE HFA 108 (90 BASE) MCG/ACT IN AERS: 2.0000 | INHALATION_SPRAY | Freq: Four times a day (QID) | RESPIRATORY_TRACT | 2 refills | Status: DC | PRN

## 2020-11-03 MED ORDER — THIAMINE HCL 100 MG PO TABS
100.0000 mg | ORAL_TABLET | Freq: Every day | ORAL | Status: DC
  Administered 2020-11-03 – 2020-11-06 (×4): 100 mg via ORAL
  Filled 2020-11-03 (×4): qty 1

## 2020-11-03 MED ORDER — ONDANSETRON HCL 4 MG/2ML IJ SOLN
4.0000 mg | Freq: Four times a day (QID) | INTRAMUSCULAR | Status: DC | PRN
  Administered 2020-11-04 (×2): 4 mg via INTRAVENOUS
  Filled 2020-11-03 (×2): qty 2

## 2020-11-03 MED ORDER — LORAZEPAM 2 MG/ML IJ SOLN: 1.0000 mg | INTRAMUSCULAR | Status: DC | PRN

## 2020-11-03 MED ORDER — ACETAMINOPHEN 325 MG PO TABS
650.0000 mg | ORAL_TABLET | Freq: Four times a day (QID) | ORAL | Status: DC | PRN
  Administered 2020-11-04: 650 mg via ORAL
  Filled 2020-11-03: qty 2

## 2020-11-03 MED ORDER — POLYETHYLENE GLYCOL 3350 17 G PO PACK: 17.0000 g | PACK | Freq: Every day | ORAL | Status: DC | PRN

## 2020-11-03 MED ORDER — WARFARIN - PHARMACIST DOSING INPATIENT
Freq: Every day | Status: DC
  Filled 2020-11-03: qty 1

## 2020-11-03 MED ORDER — METHYLPREDNISOLONE SODIUM SUCC 125 MG IJ SOLR
125.0000 mg | Freq: Once | INTRAMUSCULAR | Status: AC
  Administered 2020-11-03: 125 mg via INTRAVENOUS
  Filled 2020-11-03: qty 2

## 2020-11-03 MED ORDER — AZITHROMYCIN 250 MG PO TABS
250.0000 mg | ORAL_TABLET | Freq: Every day | ORAL | Status: DC
Start: 2020-11-04 — End: 2020-11-06
  Administered 2020-11-04 – 2020-11-06 (×3): 250 mg via ORAL
  Filled 2020-11-03 (×3): qty 1

## 2020-11-03 MED ORDER — THIAMINE HCL 100 MG/ML IJ SOLN
100.0000 mg | Freq: Every day | INTRAMUSCULAR | Status: DC
  Filled 2020-11-03: qty 2

## 2020-11-03 MED ORDER — IPRATROPIUM-ALBUTEROL 0.5-2.5 (3) MG/3ML IN SOLN
3.0000 mL | Freq: Four times a day (QID) | RESPIRATORY_TRACT | Status: DC
  Administered 2020-11-04 – 2020-11-05 (×3): 3 mL via RESPIRATORY_TRACT
  Filled 2020-11-03 (×6): qty 3

## 2020-11-03 NOTE — ED Notes (Signed)
[facility] called for transport. 

## 2020-11-03 NOTE — ED Notes (Signed)
[facility] called to say they are refusing to transport

## 2020-11-03 NOTE — ED Notes (Signed)
Case Management involved due to EMS refusal to transport patient home.  Patient is not safe to discharge by taxi at this time.

## 2020-11-03 NOTE — ED Notes (Signed)
Charge nurse, Erskine Squibb, aware that [facility] refusing to take patient back to apartment. Will attempt to find other resources to get patient discharged from ED.

## 2020-11-03 NOTE — TOC Initial Note (Signed)
Transition of Care [facility]) - Initial/Assessment Note    Patient Details  Name: Jamoni Hewes MRN: [id number] Date of Birth: [birth year]  [facility] CM/SW Contact:    Rogers Cellar, RN Phone Number: 11/03/2020, 12:17 PM  Clinical Narrative:                 Patient well known to this RN CM and current living situation. Patient is active with [facility] who has been working to assist patient for months although patient refuses or checks himself out of placement after a short period of time. RN CM received call from [facility] @ APS stating she had spoken to patient and he was requesting assistance from DSS to place him permanently. Verlon Au has already sent out request to some local facility requesting LTC placement. Verlon Au has been going to patients home 2-3 times a week trying to assist and she reported patient has been doing very well and not drinking alcohol however last week patient started back drinking and had a fall which resulted in him breaking his lift chair rendering him helpless in the floor. Verlon Au has sent out Fl2 from DSS as they will be assisting with payment. At this time patient will require LTC placement with the hopes of possibly improving to ALF.         Patient Goals and CMS Choice        Expected Discharge Plan and Services                                                Prior Living Arrangements/Services                       Activities of Daily Living      Permission Sought/Granted                  Emotional Assessment              Admission diagnosis:  Difficulty Breathing Patient Active Problem List   Diagnosis Date Noted   COPD exacerbation (HCC) 10/09/2020   Hypotension 09/26/2020   Hyponasality 09/25/2020   Hypomagnesemia 09/25/2020   Acute on chronic combined systolic and diastolic CHF (congestive heart failure) (HCC) 09/23/2020   Subtherapeutic anticoagulation    Atrial fibrillation  (HCC) 09/08/2020   CHF (congestive heart failure), NYHA class II, unspecified failure chronicity, systolic (HCC) 09/07/2020   Acute on chronic systolic CHF (congestive heart failure) (HCC) 08/30/2020   Abdominal pain 08/30/2020   Alcohol dependence with unspecified alcohol-induced disorder (HCC)    Dyspnea and respiratory abnormality 05/04/2020   Clostridium difficile diarrhea 05/04/2020   Elevated lactic acid level 05/04/2020   Unable to care for self 05/04/2020   Weakness 05/02/2020   UTI (urinary tract infection) 04/30/2020   Chronic systolic CHF (congestive heart failure) (HCC) 04/30/2020   Hypokalemia 04/30/2020   Chronic respiratory failure (HCC) 04/30/2020   Physical deconditioning 04/30/2020   Respiratory arrest (HCC) 04/06/2020   Weakness of both lower extremities 03/26/2020   CHF (congestive heart failure) (HCC) 01/27/2020   Shortness of breath 12/22/2019   Atrial fibrillation, chronic (HCC) 12/22/2019   Class 3 obesity with alveolar hypoventilation, serious comorbidity, and body mass index (BMI) of 60.0 to 69.9 in adult (HCC) 12/22/2019   GAD (generalized anxiety disorder) 10/12/2019   Second degree burn of left  forearm 09/26/2019   Alcohol withdrawal syndrome without complication (HCC)    Lower extremity ulceration, unspecified laterality, with fat layer exposed (HCC) 08/09/2019   Alcohol abuse    Pressure ulcer 06/26/2019   Iron deficiency anemia 06/22/2019   Depression 06/22/2019   Elevated troponin 06/22/2019   Left-sided Bell's palsy 05/08/2019   Chronic intractable headache 05/03/2019   Noncompliance by refusing service 05/03/2019   Facial droop 05/02/2019   Pharmacologic therapy 04/11/2019   Disorder of skeletal system 04/11/2019   Problems influencing health status 04/11/2019   Chronic anticoagulation (warfarin  COUMADIN) 04/11/2019   Elevated sed rate 04/11/2019   Elevated C-reactive protein (CRP) 04/11/2019   Elevated hemoglobin A1c 04/11/2019    Hypoalbuminemia 04/11/2019   Edema due to hypoalbuminemia 04/11/2019   Elevated brain natriuretic peptide (BNP) level 04/11/2019   Chronic hip pain (Right) 04/11/2019   Osteoarthritis of hip (Right) 04/11/2019   Chronic atrial fibrillation (HCC) 03/08/2019   Atrial fibrillation with rapid ventricular response (HCC) 03/08/2019   Degenerative joint disease of right hip 03/05/2019   Hypothyroidism 03/04/2019   Chronic venous stasis dermatitis of both lower extremities 03/04/2019   Long term (current) use of anticoagulants 02/23/2019   PE (pulmonary thromboembolism) (HCC) 01/21/2019   HLD (hyperlipidemia) 01/21/2019   CAD (coronary artery disease) 01/21/2019   Noncompliance 01/09/2019   Acquired thrombophilia (HCC) 11/28/2018   Major depressive disorder, recurrent episode, in partial remission (HCC) 09/17/2018   Chest pain 08/06/2018   Personal history of DVT (deep vein thrombosis) 07/13/2018   History of pulmonary embolism (on Coumadin) 07/13/2018   Neck pain 07/13/2018   Chronic low back pain (Bilateral)  w/ sciatica (Bilateral) 07/13/2018   TBI (traumatic brain injury) (HCC) 07/04/2018   Abnormal thyroid blood test 06/27/2018   Type 2 diabetes mellitus with other specified complication (HCC) 06/06/2018   HTN (hypertension) 06/06/2018   Chronic gout involving toe, unspecified cause, unspecified laterality 06/06/2018   Morbid obesity with BMI of 60.0-69.9, adult (HCC) 06/06/2018   Chronic pain syndrome 06/06/2018   Ulcers of both lower extremities, limited to breakdown of skin (HCC) 06/06/2018   Gout 06/06/2018   Diet-controlled diabetes mellitus (HCC) 06/06/2018   Sepsis (HCC) 03/17/2018   Acute on chronic diastolic CHF (congestive heart failure) (HCC) 02/02/2018   Centrilobular emphysema (HCC) 02/02/2018   Obstructive sleep apnea 02/02/2018   Lymphedema 02/02/2018   COPD with acute exacerbation (HCC) 02/02/2018   Acute on chronic respiratory failure with hypoxia and hypercapnia  (HCC) 01/27/2018   Acute respiratory failure (HCC) 01/16/2018   PCP:  [name], DO Pharmacy:   [facility] - [place], [place] - [address] [place] [address] Phone: [phone] Fax: [phone]     Social Determinants of Health (SDOH) Interventions    Readmission Risk Interventions Readmission Risk Prevention Plan 01/11/2020 12/24/2019 01/25/2019  Transportation Screening Complete Complete Complete  PCP or Specialist Appt within 3-5 Days - - -  HRI or Home Care Consult - - -  Social Work Consult for Recovery Care Planning/Counseling - - -  Palliative Care Screening - - -  Medication Review (RN Care Manager) Complete Complete Complete  PCP or Specialist appointment within 3-5 days of discharge Complete Complete Complete  HRI or Home Care Consult - - Complete  SW Recovery Care/Counseling Consult Complete Complete Complete  Palliative Care Screening Complete Not Applicable Not Applicable  Skilled Nursing Facility Not Applicable Not Applicable Not Applicable  Some recent data might be hidden

## 2020-11-03 NOTE — ED Notes (Signed)
Pt requested, and then moved to hospital bed. Pt is unable to help moving. Pt states that he has not been able to walk since his fall the other day, and is unable to bear weight to stand and pivot. Pt states that it is painful to even move side to side. Additionally, pt became very tachypnic when rolling from side to side (respirs around 40-50). Pt also reports that his tremors have gotten progressively worse. There is obvious trembling when pt raises drink to mouth

## 2020-11-03 NOTE — Discharge Instructions (Addendum)
As we discussed, please hold your coumadin for 2 days and then re-start it. Contact your doctor to determine if your coumadin dose needs to be changed. Take the steroids as prescribed. Use albuterol inhaler for shortness of breath or wheezing.  Return to the ER for chest pain, worsening shortness of breath or fever.

## 2020-11-03 NOTE — ED Notes (Signed)
Dietary called for meal tray at this time

## 2020-11-03 NOTE — ED Notes (Signed)
Attempted x 2 for IV access without success.  

## 2020-11-03 NOTE — ED Notes (Signed)
Respiratory called at this time due to Bi-pap beeping. Respirations even and unlabored. No signs of distress.

## 2020-11-03 NOTE — H&P (Addendum)
Knee History and Physical  [name] [name] NUU:[id number] DOB: [birth year] DOA: 11/02/2020  Referring physician: Dr. Derrill Kay, EDP  PCP: [name], DO  Outpatient Specialists: Cardiology, wound care specialist. Patient coming from: Home. Chief Complaint: Shortness of breath.  HPI: [name] [name] is a 61 y.o. male with medical history significant for severe morbid obesity with BMI of 62, OSA on CPAP, permanent A. fib on Coumadin, hyperlipidemia, hypertension, chronic anxiety/depression, gout, alcohol use disorder, frequent admission for COPD exacerbation who presented to [name] ED from home the evening of 11/02/2020 with complaints of shortness of breath of 3 days duration, progressively worsening.  Associated with intermittent chest pain, sharp, left-sided and radiating to his epigastric region.  Also reports nausea and vomiting x1 while in the ED.  Patient lives with his roommate, denies use of tobacco.  Endorses alcohol use 2 liquor drinks per month.  EMS was activated.  Upon presentation to the ED, work-up revealed acute COPD exacerbation, chest x-ray was non acute.  BMP and CBC essentially unremarkable.  Troponin 44, repeat 43.  While in the ED he received IV steroids and bronchodilators but without improvement of his symptomatology.  He stayed in the ED overnight.  Due to persistent dyspnea with minimal exertion, EDP requested admission for further evaluation and treatment of present condition.  TRH, hospitalist team, was asked to admit.  ED Course: Temperature 98.8.  BP 162/104, pulse 107, respiration rate 28, with saturation 98% on CPAP.  BMP and CBC essentially unremarkable.  Chest x-ray no acute findings.  Review of Systems: Review of systems as noted in the HPI. All other systems reviewed and are negative.   Past Medical History:  Diagnosis Date   Allergy    Anxiety    Arthritis    Asthma    Brain damage    Chronic pain of both knees 07/13/2018   Clotting  disorder (HCC)    COPD (chronic obstructive pulmonary disease) (HCC)    Depression    GERD (gastroesophageal reflux disease)    HFrEF (heart failure with reduced ejection fraction) (HCC)    a. 03/2018 Echo: EF 25-30%, diff HK. Mod LAE.   History of DVT (deep vein thrombosis)    History of pulmonary embolism    a. Chronic coumadin.   Hypertension    MI (myocardial infarction) (HCC)    Morbid obesity (HCC)    Neck pain 07/13/2018   NICM (nonischemic cardiomyopathy) (HCC)    a. s/p Cath x 3 - reportedly nl cors. Last cath 2019 in [place]; b. a. 03/2018 Echo: EF 25-30%, diff HK.   Persistent atrial fibrillation (HCC)    a. 03/2018 s/p DCCV; b. CHA2DS2VASc = 1-->Xarelto (later changed to warfarin); c. 05/2018 recurrent afib-->Amio initiated.   Sleep apnea    Sleep apnea    Past Surgical History:  Procedure Laterality Date   CARDIOVERSION N/A 03/24/2018   Procedure: CARDIOVERSION;  Surgeon: Antonieta Iba, MD;  Location: [facility] ORS;  Service: Cardiovascular;  Laterality: N/A;   CARDIOVERSION N/A 08/08/2018   Procedure: CARDIOVERSION;  Surgeon: [name], MD;  Location: [facility] ORS;  Service: Cardiovascular;  Laterality: N/A;   hearnia repair     X 3- total of two surgeries   HERNIA REPAIR     LEG SURGERY      Social History:  reports that he has never smoked. He has never used smokeless tobacco. He reports current alcohol use. He reports that he does not use drugs.   No Known Allergies  Family  History  Problem Relation Age of Onset   Heart failure Mother    Lung cancer Mother    Lung cancer Father    Heart attack Maternal Grandmother    Heart attack Maternal Grandfather       Prior to Admission medications   Medication Sig Start Date End Date Taking? Authorizing Provider  albuterol (VENTOLIN HFA) 108 (90 Base) MCG/ACT inhaler Inhale 2 puffs into the lungs every 6 (six) hours as needed for wheezing or shortness of breath. 11/03/20  Yes Veronese, [place], MD  allopurinol (ZYLOPRIM)  100 MG tablet Take 1 tablet (100 mg total) by mouth daily. 09/12/20 12/11/20 Yes Dahal, Melina Schools, MD  amiodarone (PACERONE) 400 MG tablet Take 0.5 tablets (200 mg total) by mouth 2 (two) times daily. 09/27/20  Yes Marrion Coy, MD  aspirin EC 81 MG tablet Take 1 tablet (81 mg total) by mouth daily. Swallow whole. 09/12/20 12/11/20 Yes Dahal, Melina Schools, MD  atorvastatin (LIPITOR) 40 MG tablet Take 1 tablet (40 mg total) by mouth daily. 09/12/20 12/11/20 Yes Dahal, Melina Schools, MD  carvedilol (COREG) 6.25 MG tablet Take 1 tablet (6.25 mg total) by mouth 2 (two) times daily with a meal. 09/12/20 12/11/20 Yes Dahal, Melina Schools, MD  colchicine 0.6 MG tablet Take 1 tablet (0.6 mg total) by mouth daily. 09/12/20 12/11/20 Yes Dahal, Melina Schools, MD  Fluticasone-Umeclidin-Vilant (TRELEGY ELLIPTA) 100-62.5-25 MCG/INH AEPB Inhale 1 Inhaler into the lungs daily. 09/27/20  Yes Marrion Coy, MD  levothyroxine (SYNTHROID) 75 MCG tablet Take 1 tablet (75 mcg total) by mouth daily before breakfast. 09/12/20 12/11/20 Yes Dahal, Melina Schools, MD  metoprolol succinate (TOPROL-XL) 100 MG 24 hr tablet Take 100 mg by mouth daily. Take with or immediately following a meal.   Yes [provider]  nitroGLYCERIN (NITROSTAT) 0.4 MG SL tablet Place 1 tablet under tongue every 5 minutes as needed for chest pain. (No more than 3 doses within 15 minutes) 03/08/19  Yes Flinchum, [name], FNP  predniSONE (DELTASONE) 20 MG tablet Take 3 tablets (60 mg total) by mouth daily with breakfast for 5 days. 11/03/20 11/08/20 Yes Veronese, [place], MD  spironolactone (ALDACTONE) 25 MG tablet Take 0.5 tablets (12.5 mg total) by mouth daily. 09/13/20 12/12/20 Yes Dahal, Melina Schools, MD  torsemide (DEMADEX) 20 MG tablet Take 2 tablets (40 mg total) by mouth daily. 09/27/20 12/26/20 Yes Marrion Coy, MD  warfarin (COUMADIN) 6 MG tablet Take 1 tablet (6 mg total) by mouth daily. 10/12/20 01/10/21 Yes Marrion Coy, MD  diclofenac Sodium (VOLTAREN) 1 % GEL Apply 2-4 g topically  daily as needed (pain). Patient not taking: No sig reported 08/14/20   [provider]  DULoxetine (CYMBALTA) 20 MG capsule Take 1 capsule (20 mg total) by mouth daily. Patient not taking: No sig reported 09/12/20 12/11/20  Lorin Glass, MD  guaiFENesin-dextromethorphan (ROBITUSSIN DM) 100-10 MG/5ML syrup Take 5 mLs by mouth every 4 (four) hours as needed for cough. Patient not taking: No sig reported 09/12/20   Lorin Glass, MD  montelukast (SINGULAIR) 10 MG tablet Take 1 tablet (10 mg total) by mouth at bedtime. Patient not taking: No sig reported 09/12/20 12/11/20  Lorin Glass, MD  Multiple Vitamin (MULTIVITAMIN WITH MINERALS) TABS tablet Take 1 tablet by mouth daily. Patient not taking: No sig reported    [provider]  naphazoline-glycerin (CLEAR EYES REDNESS) 0.012-0.2 % SOLN Place 1-2 drops into both eyes 4 (four) times daily as needed for eye irritation. Patient not taking: No sig reported 08/14/19   Alford Highland, MD  oxyCODONE-acetaminophen (PERCOCET) 5-325 MG tablet Take 2 tablets by mouth every 8 (eight) hours as needed for severe pain. Patient not taking: No sig reported 10/22/20 10/22/21  Ward, [name], DO  oxyCODONE-acetaminophen (PERCOCET) 7.5-325 MG tablet Take 1 tablet by mouth every 4 (four) hours as needed for severe pain. Patient not taking: Reported on 11/03/2020    [provider]  sacubitril-valsartan (ENTRESTO) 24-26 MG Take 1 tablet by mouth 2 (two) times daily. Patient not taking: No sig reported 09/12/20 12/11/20  Lorin Glass, MD  SUMAtriptan (IMITREX) 50 MG tablet Take 50 mg by mouth daily as needed for migraine. May repeat in 2 hours if headache persists or recurs. Patient not taking: No sig reported    [provider]  traZODone (DESYREL) 150 MG tablet Take 150 mg by mouth at bedtime. Patient not taking: No sig reported 10/13/20   [provider]  warfarin (COUMADIN) 1 MG tablet Take 1 tablet (1 mg total) by mouth  daily. Make up to 6 mg daily by using 5mg  x1 and 1 mg x1 per day Patient not taking: Reported on 11/03/2020 10/12/20 11/11/20  11/13/20, MD    Physical Exam: BP (!) 162/104 (BP Location: Left Wrist)   Pulse (!) 107   Temp 98.8 F (37.1 C) (Oral)   Resp (!) 28   Ht 5\' 8"  (1.727 m)   Wt (!) 186 kg   SpO2 98%   BMI 62.34 kg/m   General: 60 y.o. year-old male severe morbid obese in no acute distress.  Alert and oriented x3. Cardiovascular: Regular rate and rhythm with no rubs or gallops.  No thyromegaly or JVD noted.  Trace lower extremity edema.  Respiratory: Mild wheezing diffusely.  No rales noted. Good inspiratory effort. Abdomen: Soft nontender nondistended with normal bowel sounds x4 quadrants. Muskuloskeletal: No cyanosis or clubbing.  Trace edema noted bilaterally Neuro: CN II-XII intact, strength, sensation, reflexes.  Tremulous. Skin: No ulcerative lesions noted or rashes Psychiatry: Judgement and insight appear normal. Mood is appropriate for condition and setting          Labs on Admission:  Basic Metabolic Panel: Recent Labs  Lab 11/03/20 0156  NA 134*  K 4.6  CL 89*  CO2 32  GLUCOSE 107*  BUN 19  CREATININE 0.72  CALCIUM 8.0*   Liver Function Tests: No results for input(s): AST, ALT, ALKPHOS, BILITOT, PROT, ALBUMIN in the last 168 hours. No results for input(s): LIPASE, AMYLASE in the last 168 hours. No results for input(s): AMMONIA in the last 168 hours. CBC: Recent Labs  Lab 11/03/20 0156  WBC 7.0  NEUTROABS 4.5  HGB 11.7*  HCT 35.8*  MCV 92.7  PLT 258   Cardiac Enzymes: No results for input(s): CKTOTAL, CKMB, CKMBINDEX, TROPONINI in the last 168 hours.  BNP (last 3 results) Recent Labs    09/23/20 0755 10/08/20 1542 11/03/20 0156  BNP 133.4* 171.3* 80.5    ProBNP (last 3 results) No results for input(s): PROBNP in the last 8760 hours.  CBG: No results for input(s): GLUCAP in the last 168 hours.  Radiological Exams on  Admission: DG Chest Portable 1 View  Result Date: 11/03/2020 CLINICAL DATA:  Dyspnea EXAM: PORTABLE CHEST 1 VIEW COMPARISON:  10/08/2020 FINDINGS: Lung volumes are small, but are stable since prior examination and are clear. No pneumothorax or pleural effusion. Cardiac size is mildly enlarged, stable. Pulmonary vascularity is normal. No acute bone abnormality. IMPRESSION: No active disease.  Stable cardiomegaly. Electronically Signed  By: Helyn Numbers M.D.   On: 11/03/2020 00:38    EKG: I independently viewed the EKG done and my findings are as followed: None available at the time of this visit.  Assessment/Plan Present on Admission:  COPD exacerbation (HCC)  Active Problems:   COPD exacerbation (HCC)  Acute COPD exacerbation Denies use of tobacco States he uses a CPAP nightly. Chest x-ray, personally reviewed, no acute findings, cardiomegaly. Bronchodilators, incentive spirometer, IV Solu-Medrol 40 mg twice daily Continue Z-Pak started in the ED.  Acute hypoxic respiratory failure secondary to acute COPD exacerbation. Not on oxygen supplementation at baseline On BiPAP in the ED Obtain ABG, if unable, then obtain VBG to assess pH and CO2 levels. Maintain O2 saturation greater than 90%. Home oxygen evaluation 11/04/2020 TOC consulted to assist with DME's if required.  Permanent A. fib on Coumadin Rate controlled on home Toprol-XL On Coumadin, supratherapeutic INR  Supratherapeutic INR INR presentation 4.5 on 11/03/2020 Goal INR between 2 and 4. Hold off Coumadin tonight Pharmacy consulted to assist with Coumadin dosing Repeat INR tomorrow  Elevated troponin, suspect demand ischemia in the setting of acute COPD exacerbation Initial troponin 44, down trended to 43 Reports intermittent sharp left-sided chest pain. Obtain 12 EKG Monitor on telemetry  OSA on CPAP Resume home CPAP nightly  Severe morbid obesity BMI 62 Recommend weight loss outpatient with regular physical  activity and healthy dieting.  Alcohol use disorder Endorses drinking 2 drinks of liquor per month Tremulous on exam Start CIWA protocol  Chronic anxiety/depression Resume home regimen Cymbalta  Hypothyroidism Resume home levothyroxine  Hyperlipidemia Resume home Lipitor  Gout Resume home allopurinol and colchicine  Ambulatory dysfunction PT OT assessment Fall precautions TOC to assist with DMEs or home health services if required.    DVT prophylaxis: Supratherapeutic INR Coumadin  Code Status: Full code  Family Communication: None at bedside  Disposition Plan: Admitted to progressive cardiac unit  Consults called: None  Admission status: Inpatient status.  Patient will require at least 2 midnights for further evaluation and treatment of present condition   Status is: Inpatient    Dispo:  Patient From:  Home  Planned Disposition:  Home  Medically stable for discharge:  No          Darlin Drop MD [facility] Pager [phone]  If 7PM-7AM, please contact night-coverage [email or web address] Password [facility]  11/03/2020, 6:00 PM
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
## 2020-11-03 NOTE — Consult Note (Signed)
ANTICOAGULATION CONSULT NOTE - Initial Consult  Pharmacy Consult for warfarin dosing Indication: atrial fibrillation  No Known Allergies  Patient Measurements: Height: 5\' 8"  (172.7 cm) Weight: (!) 186 kg (410 lb) IBW/kg (Calculated) : 68.4  Vital Signs: BP: 162/104 (09/19 1700) Pulse Rate: 107 (09/19 1700)  Labs: Recent Labs    11/03/20 0156 11/03/20 0416  HGB 11.7*  --   HCT 35.8*  --   PLT 258  --   LABPROT  --  42.7*  INR  --  4.5*  CREATININE 0.72  --   TROPONINIHS 44* 43*    Estimated Creatinine Clearance: 160.3 mL/min (by C-G formula based on SCr of 0.72 mg/dL).   Medical History: Past Medical History:  Diagnosis Date   Allergy    Anxiety    Arthritis    Asthma    Brain damage    Chronic pain of both knees 07/13/2018   Clotting disorder (HCC)    COPD (chronic obstructive pulmonary disease) (HCC)    Depression    GERD (gastroesophageal reflux disease)    HFrEF (heart failure with reduced ejection fraction) (HCC)    a. 03/2018 Echo: EF 25-30%, diff HK. Mod LAE.   History of DVT (deep vein thrombosis)    History of pulmonary embolism    a. Chronic coumadin.   Hypertension    MI (myocardial infarction) (HCC)    Morbid obesity (HCC)    Neck pain 07/13/2018   NICM (nonischemic cardiomyopathy) (HCC)    a. s/p Cath x 3 - reportedly nl cors. Last cath 2019 in [place]; b. a. 03/2018 Echo: EF 25-30%, diff HK.   Persistent atrial fibrillation (HCC)    a. 03/2018 s/p DCCV; b. CHA2DS2VASc = 1-->Xarelto (later changed to warfarin); c. 05/2018 recurrent afib-->Amio initiated.   Sleep apnea    Sleep apnea     Medications:  (Not in a hospital admission)  Warfarin 6mg  Daily w/last dose on 11/02/20  Assessment:  60 y.o. male with medical history significant for severe morbid obesity with BMI of 62, OSA on CPAP, permanent A. fib on Coumadin, hyperlipidemia, hypertension, chronic anxiety/depression, gout, alcohol use disorder, frequent admission for COPD exacerbation   Goal  of Therapy:  INR 2-3 Monitor platelets by anticoagulation protocol: Yes   Plan:  No warfarin dose today as current INR=4.5.  Repeat INR with AM labs.  11/04/20 11/03/2020,6:50 PM

## 2020-11-03 NOTE — ED Notes (Signed)
After speaking to [facility], patient now states he wants placement. Message sent to charge RN and Darl Pikes with social work.

## 2020-11-03 NOTE — ED Notes (Signed)
Patient on phone talking to [employer] social worker, Cathlean Cower about safe discharge.

## 2020-11-04 ENCOUNTER — Encounter: Payer: Self-pay | Admitting: Internal Medicine

## 2020-11-04 DIAGNOSIS — J441 Chronic obstructive pulmonary disease with (acute) exacerbation: Principal | ICD-10-CM

## 2020-11-04 LAB — GASTROINTESTINAL PANEL BY PCR, STOOL (REPLACES STOOL CULTURE)

## 2020-11-04 LAB — BASIC METABOLIC PANEL
Anion gap: 14 (ref 5–15)
BUN: 17 mg/dL (ref 6–20)
CO2: 32 mmol/L (ref 22–32)
Calcium: 8 mg/dL — ABNORMAL LOW (ref 8.9–10.3)
Chloride: 87 mmol/L — ABNORMAL LOW (ref 98–111)
Creatinine, Ser: 0.69 mg/dL (ref 0.61–1.24)
GFR, Estimated: >60 mL/min (ref >60)
Glucose, Bld: 156 mg/dL — ABNORMAL HIGH (ref 70–99)
Potassium: 3.7 mmol/L (ref 3.5–5.1)
Sodium: 133 mmol/L — ABNORMAL LOW (ref 135–145)

## 2020-11-04 LAB — CBC
HCT: 31.9 % — ABNORMAL LOW (ref 39.0–52.0)
Hemoglobin: 10.3 g/dL — ABNORMAL LOW (ref 13.0–17.0)
MCH: 29.3 pg (ref 26.0–34.0)
MCHC: 32.3 g/dL (ref 30.0–36.0)
MCV: 90.6 fL (ref 80.0–100.0)
Platelets: 165 10*3/uL (ref 150–400)
RBC: 3.52 MIL/uL — ABNORMAL LOW (ref 4.22–5.81)
RDW: 16.3 % — ABNORMAL HIGH (ref 11.5–15.5)
WBC: 4.9 10*3/uL (ref 4.0–10.5)
nRBC: 0.4 % — ABNORMAL HIGH (ref 0.0–0.2)

## 2020-11-04 LAB — PHOSPHORUS: Phosphorus: 1.6 mg/dL — ABNORMAL LOW (ref 2.5–4.6)

## 2020-11-04 LAB — PROTIME-INR
INR: 3.3 — ABNORMAL HIGH (ref 0.8–1.2)
Prothrombin Time: 33.3 seconds — ABNORMAL HIGH (ref 11.4–15.2)

## 2020-11-04 LAB — MAGNESIUM: Magnesium: 1.6 mg/dL — ABNORMAL LOW (ref 1.7–2.4)

## 2020-11-04 MED ORDER — MAGNESIUM SULFATE 2 GM/50ML IV SOLN
2.0000 g | Freq: Once | INTRAVENOUS | Status: AC
  Administered 2020-11-04: 2 g via INTRAVENOUS
  Filled 2020-11-04: qty 50

## 2020-11-04 MED ORDER — LOPERAMIDE HCL 2 MG PO CAPS
2.0000 mg | ORAL_CAPSULE | Freq: Four times a day (QID) | ORAL | Status: DC | PRN
  Administered 2020-11-04: 2 mg via ORAL
  Filled 2020-11-04: qty 1

## 2020-11-04 MED ORDER — OXYCODONE-ACETAMINOPHEN 7.5-325 MG PO TABS
1.0000 | ORAL_TABLET | Freq: Three times a day (TID) | ORAL | Status: DC | PRN
Start: 2020-11-04 — End: 2020-11-04
  Administered 2020-11-04: 1 via ORAL
  Filled 2020-11-04: qty 1

## 2020-11-04 MED ORDER — K PHOS MONO-SOD PHOS DI & MONO 155-852-130 MG PO TABS
500.0000 mg | ORAL_TABLET | Freq: Three times a day (TID) | ORAL | Status: AC
  Administered 2020-11-04 – 2020-11-05 (×5): 500 mg via ORAL
  Filled 2020-11-04 (×7): qty 2

## 2020-11-04 MED ORDER — OXYCODONE-ACETAMINOPHEN 7.5-325 MG PO TABS
1.0000 | ORAL_TABLET | Freq: Four times a day (QID) | ORAL | Status: DC | PRN
  Administered 2020-11-04 – 2020-11-05 (×2): 1 via ORAL
  Filled 2020-11-04 (×2): qty 1

## 2020-11-04 MED ORDER — WARFARIN SODIUM 1 MG PO TABS
1.0000 mg | ORAL_TABLET | Freq: Once | ORAL | Status: AC
  Administered 2020-11-04: 1 mg via ORAL
  Filled 2020-11-04: qty 1

## 2020-11-04 NOTE — ED Notes (Signed)
Pt repositioned for comfort. Pt placed back on monitors. No additional needs verbalized at this time. Bed low & locked; call light & personal items within reach. Lights dimmed for pt comfort per pt request.

## 2020-11-04 NOTE — ED Notes (Signed)
Pt cleaned up; peri care performed; pads under pt changed. Stool sample obtained & is in room. Pt repositioned for comfort. Bed low & locked; call light & personal items within reach. No additional needs verbalized at this time.

## 2020-11-04 NOTE — Progress Notes (Signed)
PT Cancellation Note  Patient Details Name: [name] [name] MRN: [id number] DOB: 1960/09/27   Cancelled Treatment:    Reason Eval/Treat Not Completed: Medical issues which prohibited therapy. PT order received and chart reviewed. Pt's INR currently 4.5 which contraindicates PT services at this time. Physical therapy evaluation deferred to later date/time once appropriate.   7:52 AM, 11/04/20 Rosamaria Lints, PT, DPT Physical Therapist - [facility]  [phone] [facility])

## 2020-11-04 NOTE — Progress Notes (Signed)
OT Cancellation Note  Patient Details Name: Johnwesley Lederman MRN: [id number] DOB: [birth year]   Cancelled Treatment:    Reason Eval/Treat Not Completed: Medical issues which prohibited therapy. OT order received and chart reviewed. Pt's INR currently 4.5 which is contraindicated for therapeutic intervention. OT to re-attempt when pt is able to actively participate.   Jackquline Denmark, MS, OTR/L , CBIS ascom [phone]  11/04/20, 7:51 AM

## 2020-11-04 NOTE — TOC Progression Note (Signed)
Transition of Care [facility]) - Progression Note    Patient Details  Name: [name] [name] MRN: [id number] Date of Birth: Jan 28, 1961  Transition of Care [facility]) CM/SW Contact  Joseph Art, [place] Phone Number: 11/04/2020, 4:23 PM  Clinical Narrative:      CSW spoke with Diona Fanti [facility] APS, with placement update.  Patient has been accepted to [facility] for LTC placement. Ms. Yetta Barre will update patient this afternoon when she meets with patient.      Expected Discharge Plan and Services                                                 Social Determinants of Health (SDOH) Interventions    Readmission Risk Interventions Readmission Risk Prevention Plan 01/11/2020 12/24/2019 01/25/2019  Transportation Screening Complete Complete Complete  PCP or Specialist Appt within 3-5 Days - - -  HRI or Home Care Consult - - -  Social Work Consult for Recovery Care Planning/Counseling - - -  Palliative Care Screening - - -  Medication Review [facility]) Complete Complete Complete  PCP or Specialist appointment within 3-5 days of discharge Complete Complete Complete  HRI or Home Care Consult - - Complete  SW Recovery Care/Counseling Consult Complete Complete Complete  Palliative Care Screening Complete Not Applicable Not Applicable  Skilled Nursing Facility Not Applicable Not Applicable Not Applicable  Some recent data might be hidden

## 2020-11-04 NOTE — TOC Progression Note (Signed)
Transition of Care [facility]) - Progression Note    Patient Details  Name: [name] [name] MRN: [id number] Date of Birth: 11/18/1960  Transition of Care [facility]) CM/SW Contact  Joseph Art, [place] Phone Number: 11/04/2020, 3:30 PM  Clinical Narrative:      FL2 and CSW referral sent to [facility] for possible LTC placement per request of Diona Fanti [facility].      Expected Discharge Plan and Services                                                 Social Determinants of Health (SDOH) Interventions    Readmission Risk Interventions Readmission Risk Prevention Plan 01/11/2020 12/24/2019 01/25/2019  Transportation Screening Complete Complete Complete  PCP or Specialist Appt within 3-5 Days - - -  HRI or Home Care Consult - - -  Social Work Consult for Recovery Care Planning/Counseling - - -  Palliative Care Screening - - -  Medication Review Oceanographer) Complete Complete Complete  PCP or Specialist appointment within 3-5 days of discharge Complete Complete Complete  HRI or Home Care Consult - - Complete  SW Recovery Care/Counseling Consult Complete Complete Complete  Palliative Care Screening Complete Not Applicable Not Applicable  Skilled Nursing Facility Not Applicable Not Applicable Not Applicable  Some recent data might be hidden

## 2020-11-04 NOTE — Progress Notes (Addendum)
Progress Note    [name] [name]  HFS:[id number] DOB: [birth year]  DOA: 11/02/2020 PCP: [name], DO      Brief Narrative:    Medical records reviewed and are as summarized below:  [name] [name] is a 60 y.o. male with medical history significant for severe morbid obesity with BMI of 62, OSA on CPAP, chronic hypoxemic and hypercapnic respiratory failure on 4 L/min oxygen and BiPAP at home, permanent A. fib on Coumadin, hyperlipidemia, hypertension, chronic anxiety/depression, gout, alcohol use disorder, frequent admission for COPD exacerbation.  [name] [name] presented to the hospital because of shortness of breath intermittent chest pain, nausea and vomiting.  [name] [name] was admitted to the hospital for acute COPD exacerbation and acute on chronic hypoxemic respiratory failure.  [name] [name] was initially placed on BiPAP and then subsequently transitioned to oxygen via nasal cannula.    Assessment/Plan:   Active Problems:   COPD exacerbation (HCC)   Body mass index is 62.34 kg/m.   Acute COPD exacerbation: Continue oral azithromycin, steroids and bronchodilators.  Acute hypoxemic on chronic hypoxemic and hypercapnic respiratory failure: [name] [name] is off of BiPAP.  [name] [name] is tolerating oxygen via nasal cannula.  [name] [name] uses BiPAP at night.  Adenovirus gastroenteritis: Stool panel was positive for adenovirus.  Imodium as needed as needed for diarrhea.  Encouraged adequate oral hydration.  Hypophosphatemia and hypomagnesemia: Replete with oral potassium phosphate and IV magnesium sulfate respectively.  Monitor electrolytes.  Mildly elevated troponin: This is likely from demand ischemia.  Right hip and shoulder pain from osteoarthritis: Analgesics as needed for pain.  Permanent atrial fibrillation: INR was 4.5 yesterday.  INR 3.3 today.  Pharmacist is managing warfarin and INR.  Other comorbidities include alcohol use disorder, anxiety, depression, hypothyroidism, gout,  hyperlipidemia  Diet Order             Diet Heart Room service appropriate? Yes; Fluid consistency: Thin  Diet effective now                      Consultants: None  Procedures: None    Medications:    azithromycin  250 mg Oral Daily   folic acid  1 mg Oral Daily   ipratropium-albuterol  3 mL Nebulization Q6H   methylPREDNISolone (SOLU-MEDROL) injection  40 mg Intravenous Q12H   multivitamin with minerals  1 tablet Oral Daily   thiamine  100 mg Oral Daily   Or   thiamine  100 mg Intravenous Daily   warfarin  1 mg Oral ONCE-1600   Warfarin - Pharmacist Dosing Inpatient   Does not apply q1600   Continuous Infusions:   Anti-infectives (From admission, onward)    Start     Dose/Rate Route Frequency Ordered Stop   11/04/20 1000  azithromycin (ZITHROMAX) tablet 250 mg       See Hyperspace for full Linked Orders Report.   250 mg Oral Daily 11/03/20 1821 11/08/20 0959   11/03/20 1900  azithromycin (ZITHROMAX) tablet 500 mg       See Hyperspace for full Linked Orders Report.   500 mg Oral Daily 11/03/20 1821 11/04/20 0959   11/03/20 1700  azithromycin (ZITHROMAX) 500 mg in sodium chloride 0.9 % 250 mL IVPB        500 mg 250 mL/hr over 60 Minutes Intravenous  Once 11/03/20 1648 11/03/20 1821              Family Communication/Anticipated D/C date and plan/Code Status   DVT prophylaxis: SCDs Start:  11/03/20 1745 warfarin (COUMADIN) tablet 1 mg     Code Status: Full Code  Family Communication: None Disposition Plan:    Status is: Inpatient  Remains inpatient appropriate because:Inpatient level of care appropriate due to severity of illness  Dispo:  Patient From: Home  Planned Disposition: Home  Medically stable for discharge: No             Subjective:   C/o watery diarrhea that started yesterday.  [name] [name] had 3 watery stools this morning.  Patient thinks there was blood in it but his nurse at the bedside said [name] [name] did not see any blood.  No  vomiting.  [name] [name] also complains of pain in the right hip and right shoulder  Objective:    Vitals:   11/04/20 0607 11/04/20 0756 11/04/20 0900 11/04/20 1114  BP: 135/83 113/66 (!) 141/93 106/71  Pulse: 78 67 72 74  Resp: (!) 22 16  20   Temp:      TempSrc:      SpO2: 98% 97% 94% 93%  Weight:      Height:       No data found.  No intake or output data in the 24 hours ending 11/04/20 1142 Filed Weights   11/03/20 0002  Weight: (!) 186 kg    Exam:  GEN: NAD SKIN: Warm and dry EYES: No pallor or icterus ENT: MMM CV: RRR PULM: CTA B ABD: soft, obese with pannus, NT, +BS CNS: AAO x 3, non focal EXT: Chronic erythema and swelling of bilateral legs suggestive of chronic venous insufficiency        Data Reviewed:   I have personally reviewed following labs and imaging studies:  Labs: Labs show the following:   Basic Metabolic Panel: Recent Labs  Lab 11/03/20 0156 11/04/20 0707  NA 134* 133*  K 4.6 3.7  CL 89* 87*  CO2 32 32  GLUCOSE 107* 156*  BUN 19 17  CREATININE 0.72 0.69  CALCIUM 8.0* 8.0*  MG  --  1.6*  PHOS  --  1.6*   GFR Estimated Creatinine Clearance: 160.3 mL/min (by C-G formula based on SCr of 0.69 mg/dL). Liver Function Tests: No results for input(s): AST, ALT, ALKPHOS, BILITOT, PROT, ALBUMIN in the last 168 hours. No results for input(s): LIPASE, AMYLASE in the last 168 hours. No results for input(s): AMMONIA in the last 168 hours. Coagulation profile Recent Labs  Lab 11/03/20 0416 11/04/20 0707  INR 4.5* 3.3*    CBC: Recent Labs  Lab 11/03/20 0156 11/04/20 0707  WBC 7.0 4.9  NEUTROABS 4.5  --   HGB 11.7* 10.3*  HCT 35.8* 31.9*  MCV 92.7 90.6  PLT 258 165   Cardiac Enzymes: No results for input(s): CKTOTAL, CKMB, CKMBINDEX, TROPONINI in the last 168 hours. BNP (last 3 results) No results for input(s): PROBNP in the last 8760 hours. CBG: No results for input(s): GLUCAP in the last 168 hours. D-Dimer: No results for  input(s): DDIMER in the last 72 hours. Hgb A1c: No results for input(s): HGBA1C in the last 72 hours. Lipid Profile: No results for input(s): CHOL, HDL, LDLCALC, TRIG, CHOLHDL, LDLDIRECT in the last 72 hours. Thyroid function studies: No results for input(s): TSH, T4TOTAL, T3FREE, THYROIDAB in the last 72 hours.  Invalid input(s): FREET3 Anemia work up: No results for input(s): VITAMINB12, FOLATE, FERRITIN, TIBC, IRON, RETICCTPCT in the last 72 hours. Sepsis Labs: Recent Labs  Lab 11/03/20 0156 11/04/20 0707  WBC 7.0 4.9    Microbiology Recent Results (  from the past 240 hour(s))  SARS CORONAVIRUS 2 (TAT 6-24 HRS) Nasopharyngeal Nasopharyngeal Swab     Status: None   Collection Time: 11/03/20  3:19 AM   Specimen: Nasopharyngeal Swab  Result Value Ref Range Status   SARS Coronavirus 2 NEGATIVE NEGATIVE Final    Comment: (NOTE) SARS-CoV-2 target nucleic acids are NOT DETECTED.  The SARS-CoV-2 RNA is generally detectable in upper and lower respiratory specimens during the acute phase of infection. Negative results do not preclude SARS-CoV-2 infection, do not rule out co-infections with other pathogens, and should not be used as the sole basis for treatment or other patient management decisions. Negative results must be combined with clinical observations, patient history, and epidemiological information. The expected result is Negative.  Fact Sheet for Patients: HairSlick.no  Fact Sheet for Healthcare Providers: quierodirigir.com  This test is not yet approved or cleared by the [place] FDA and  has been authorized for detection and/or diagnosis of SARS-CoV-2 by FDA under an Emergency Use Authorization (EUA). This EUA will remain  in effect (meaning this test can be used) for the duration of the COVID-19 declaration under Se ction 564(b)(1) of the Act, 21 U.S.C. section 360bbb-3(b)(1), unless the authorization is  terminated or revoked sooner.  Performed at [facility], [address], [place], [place] [address]   Gastrointestinal Panel by PCR , Stool     Status: None   Collection Time: 11/04/20  3:47 AM   Specimen: Stool  Result Value Ref Range Status   Campylobacter species NOT DETECTED NOT DETECTED Final   Plesimonas shigelloides NOT DETECTED NOT DETECTED Final   Salmonella species NOT DETECTED NOT DETECTED Final   Yersinia enterocolitica NOT DETECTED NOT DETECTED Final   Vibrio species NOT DETECTED NOT DETECTED Final   Vibrio cholerae NOT DETECTED NOT DETECTED Final   Enteroaggregative E coli (EAEC) NOT DETECTED NOT DETECTED Final   Enteropathogenic E coli (EPEC) NOT DETECTED NOT DETECTED Final   Enterotoxigenic E coli (ETEC) NOT DETECTED NOT DETECTED Final   Shiga like toxin producing E coli (STEC) NOT DETECTED NOT DETECTED Final   Shigella/Enteroinvasive E coli (EIEC) NOT DETECTED NOT DETECTED Final   Cryptosporidium NOT DETECTED NOT DETECTED Final   Cyclospora cayetanensis NOT DETECTED NOT DETECTED Final   Entamoeba histolytica NOT DETECTED NOT DETECTED Final   Giardia lamblia NOT DETECTED NOT DETECTED Final   Adenovirus F40/41 NOT DETECTED NOT DETECTED Final   Astrovirus NOT DETECTED NOT DETECTED Final   Norovirus GI/GII NOT DETECTED NOT DETECTED Final   Rotavirus A NOT DETECTED NOT DETECTED Final   Sapovirus (I, II, IV, and V) NOT DETECTED NOT DETECTED Final    Comment: Performed at [facility], [address] [place], [place] [address]    Procedures and diagnostic studies:  DG Chest Portable 1 View  Result Date: 11/03/2020 CLINICAL DATA:  Dyspnea EXAM: PORTABLE CHEST 1 VIEW COMPARISON:  10/08/2020 FINDINGS: Lung volumes are small, but are stable since prior examination and are clear. No pneumothorax or pleural effusion. Cardiac size is mildly enlarged, stable. Pulmonary vascularity is normal. No acute bone abnormality. IMPRESSION: No active disease.  Stable  cardiomegaly. Electronically Signed   By: Helyn Numbers M.D.   On: 11/03/2020 00:38               LOS: 1 day   Shaterra Sanzone  [facility]   Pager on [email or web address]. If 7PM-7AM, please contact night-coverage at [email or web address]     11/04/2020, 11:42 AM
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
## 2020-11-04 NOTE — Progress Notes (Signed)
ANTICOAGULATION CONSULT NOTE - Follow Up Consult  Pharmacy Consult for Warfarin Indication: atrial fibrillation  No Known Allergies  Patient Measurements: Height: 5\' 8"  (172.7 cm) Weight: (!) 186 kg (410 lb) IBW/kg (Calculated) : 68.4  Vital Signs: BP: 141/93 (09/20 0900) Pulse Rate: 72 (09/20 0900)  Labs: Recent Labs    11/03/20 0156 11/03/20 0416 11/04/20 0707  HGB 11.7*  --  10.3*  HCT 35.8*  --  31.9*  PLT 258  --  165  LABPROT  --  42.7* 33.3*  INR  --  4.5* 3.3*  CREATININE 0.72  --  0.69  TROPONINIHS 44* 43*  --     Estimated Creatinine Clearance: 160.3 mL/min (by C-G formula based on SCr of 0.69 mg/dL).   Medications:  Scheduled:   azithromycin  250 mg Oral Daily   folic acid  1 mg Oral Daily   ipratropium-albuterol  3 mL Nebulization Q6H   methylPREDNISolone (SOLU-MEDROL) injection  40 mg Intravenous Q12H   multivitamin with minerals  1 tablet Oral Daily   thiamine  100 mg Oral Daily   Or   thiamine  100 mg Intravenous Daily   Warfarin - Pharmacist Dosing Inpatient   Does not apply q1600    Assessment: 60 y.o. male with medical history significant for severe morbid obesity with BMI of 62, OSA on CPAP, permanent A. fib on Coumadin, hyperlipidemia, hypertension, chronic anxiety/depression, gout, alcohol use disorder, frequent admission for COPD exacerbation.  Pharmacy consulted for warfarin management. Noted patient on IV antibiotic and steroid medication. MVI as consistent source dietary vitK. Home dose warfarin 6mg  daily  Date         INR      Warfarin dose     Notes 11/03/20     4.5             HELD           Admitted (Abx,steroids and MVI) 11/04/20     3.3              1mg               Goal of Therapy:  INR 2-3 Monitor platelets by anticoagulation protocol: Yes   Plan:  Patient discharge from Acute Care on 8/28 with dose of warfarin 6mg  daily. INR supratherapeutic on admission and dropped of 1.2 after holding 1 dose of warfarin. Noted new DDI with  steroids and CIWA protocol.  Will give warfarin 1mg  today due to significant drop overnight. Plan to continue daily INR for 2 more days and dose accordantly   Galia Rahm Rodriguez-Guzman PharmD, BCPS 11/04/2020 10:18 AM

## 2020-11-04 NOTE — ED Notes (Signed)
Peri care completed and pt's linens changed at this time. Dressings applied to pts skin ulcerations at this time.

## 2020-11-05 LAB — BASIC METABOLIC PANEL
Anion gap: 13 (ref 5–15)
BUN: 18 mg/dL (ref 6–20)
CO2: 31 mmol/L (ref 22–32)
Calcium: 8.1 mg/dL — ABNORMAL LOW (ref 8.9–10.3)
Chloride: 88 mmol/L — ABNORMAL LOW (ref 98–111)
Creatinine, Ser: 0.75 mg/dL (ref 0.61–1.24)
GFR, Estimated: >60 mL/min (ref >60)
Glucose, Bld: 160 mg/dL — ABNORMAL HIGH (ref 70–99)
Potassium: 4.6 mmol/L (ref 3.5–5.1)
Sodium: 132 mmol/L — ABNORMAL LOW (ref 135–145)

## 2020-11-05 LAB — PHOSPHORUS: Phosphorus: 2.6 mg/dL (ref 2.5–4.6)

## 2020-11-05 LAB — MAGNESIUM: Magnesium: 2.1 mg/dL (ref 1.7–2.4)

## 2020-11-05 LAB — PROTIME-INR
INR: 2.9 — ABNORMAL HIGH (ref 0.8–1.2)
Prothrombin Time: 30.2 seconds — ABNORMAL HIGH (ref 11.4–15.2)

## 2020-11-05 MED ORDER — TRAZODONE HCL 50 MG PO TABS
150.0000 mg | ORAL_TABLET | Freq: Every day | ORAL | Status: DC
  Administered 2020-11-05: 150 mg via ORAL
  Filled 2020-11-05: qty 1

## 2020-11-05 MED ORDER — TORSEMIDE 20 MG PO TABS
40.0000 mg | ORAL_TABLET | Freq: Every day | ORAL | Status: DC
  Administered 2020-11-05 – 2020-11-06 (×2): 40 mg via ORAL
  Filled 2020-11-05 (×2): qty 2

## 2020-11-05 MED ORDER — ATORVASTATIN CALCIUM 20 MG PO TABS
40.0000 mg | ORAL_TABLET | Freq: Every day | ORAL | Status: DC
  Administered 2020-11-05 – 2020-11-06 (×2): 40 mg via ORAL
  Filled 2020-11-05 (×2): qty 2

## 2020-11-05 MED ORDER — COLCHICINE 0.6 MG PO TABS
0.6000 mg | ORAL_TABLET | Freq: Every day | ORAL | Status: DC
  Administered 2020-11-05 – 2020-11-06 (×2): 0.6 mg via ORAL
  Filled 2020-11-05 (×2): qty 1

## 2020-11-05 MED ORDER — SPIRONOLACTONE 25 MG PO TABS
12.5000 mg | ORAL_TABLET | Freq: Every day | ORAL | Status: DC
  Administered 2020-11-05 – 2020-11-06 (×2): 12.5 mg via ORAL
  Filled 2020-11-05: qty 0.5
  Filled 2020-11-05 (×2): qty 1
  Filled 2020-11-05: qty 0.5

## 2020-11-05 MED ORDER — ASPIRIN EC 81 MG PO TBEC
81.0000 mg | DELAYED_RELEASE_TABLET | Freq: Every day | ORAL | Status: DC
  Administered 2020-11-05 – 2020-11-06 (×2): 81 mg via ORAL
  Filled 2020-11-05 (×2): qty 1

## 2020-11-05 MED ORDER — AMIODARONE HCL 200 MG PO TABS
200.0000 mg | ORAL_TABLET | Freq: Two times a day (BID) | ORAL | Status: DC
  Administered 2020-11-05 – 2020-11-06 (×3): 200 mg via ORAL
  Filled 2020-11-05 (×3): qty 1

## 2020-11-05 MED ORDER — IPRATROPIUM-ALBUTEROL 0.5-2.5 (3) MG/3ML IN SOLN: 3.0000 mL | Freq: Four times a day (QID) | RESPIRATORY_TRACT | Status: DC | PRN

## 2020-11-05 MED ORDER — WARFARIN SODIUM 1 MG PO TABS
1.0000 mg | ORAL_TABLET | Freq: Once | ORAL | Status: AC
  Administered 2020-11-05: 1 mg via ORAL
  Filled 2020-11-05: qty 1

## 2020-11-05 MED ORDER — OXYCODONE HCL 5 MG PO TABS
5.0000 mg | ORAL_TABLET | Freq: Three times a day (TID) | ORAL | Status: DC | PRN
  Administered 2020-11-05 – 2020-11-06 (×2): 5 mg via ORAL
  Filled 2020-11-05 (×2): qty 1

## 2020-11-05 MED ORDER — LEVOTHYROXINE SODIUM 50 MCG PO TABS
75.0000 ug | ORAL_TABLET | Freq: Every day | ORAL | Status: DC
  Administered 2020-11-06: 75 ug via ORAL
  Filled 2020-11-05: qty 1

## 2020-11-05 MED ORDER — OXYCODONE-ACETAMINOPHEN 7.5-325 MG PO TABS
1.0000 | ORAL_TABLET | Freq: Three times a day (TID) | ORAL | Status: DC
  Administered 2020-11-05 – 2020-11-06 (×4): 1 via ORAL
  Filled 2020-11-05 (×4): qty 1

## 2020-11-05 MED ORDER — ALLOPURINOL 100 MG PO TABS
100.0000 mg | ORAL_TABLET | Freq: Every day | ORAL | Status: DC
  Administered 2020-11-05 – 2020-11-06 (×2): 100 mg via ORAL
  Filled 2020-11-05 (×2): qty 1

## 2020-11-05 NOTE — Progress Notes (Addendum)
ANTICOAGULATION CONSULT NOTE - Follow Up Consult  Pharmacy Consult for Warfarin Indication: atrial fibrillation  No Known Allergies  Patient Measurements: Height: 5\' 8"  (172.7 cm) Weight: (!) 186 kg (410 lb) IBW/kg (Calculated) : 68.4  Vital Signs: Temp: 98.5 F (36.9 C) (09/21 0206) Temp Source: Oral (09/21 0206) BP: 129/86 (09/21 0206) Pulse Rate: 70 (09/21 0206)  Labs: Recent Labs    11/03/20 0156 11/03/20 0416 11/04/20 0707 11/05/20 0411  HGB 11.7*  --  10.3*  --   HCT 35.8*  --  31.9*  --   PLT 258  --  165  --   LABPROT  --  42.7* 33.3* 30.2*  INR  --  4.5* 3.3* 2.9*  CREATININE 0.72  --  0.69 0.75  TROPONINIHS 44* 43*  --   --      Estimated Creatinine Clearance: 160.3 mL/min (by C-G formula based on SCr of 0.75 mg/dL).   Medications:  Scheduled:   azithromycin  250 mg Oral Daily   folic acid  1 mg Oral Daily   ipratropium-albuterol  3 mL Nebulization Q6H   methylPREDNISolone (SOLU-MEDROL) injection  40 mg Intravenous Q12H   multivitamin with minerals  1 tablet Oral Daily   phosphorus  500 mg Oral TID   thiamine  100 mg Oral Daily   Or   thiamine  100 mg Intravenous Daily   Warfarin - Pharmacist Dosing Inpatient   Does not apply q1600    Assessment: 60 y.o. male with medical history significant for severe morbid obesity with BMI of 62, OSA on CPAP, permanent A. fib on Coumadin, hyperlipidemia, hypertension, chronic anxiety/depression, gout, alcohol use disorder, frequent admission for COPD exacerbation. Pharmacy consulted for warfarin management.   DDI include: azithromycin (potential to increase INR), methylprednisolone (potential to increase INR),  torsemide (potential to increase INR), amiodarone (PTA med not restarted in-pt, increases INR), MVI as consistent source dietary vitK.  Home dose warfarin 6mg  daily  Date  INR Dose 9/19 4.5 HELD 9/20 3.3 1mg  9/21 2.9 1mg           Goal of Therapy:  INR 2-3 Monitor platelets by anticoagulation  protocol: Yes   Plan:  --INR therapeutic and trending down. Expect INR to decrease w/ AM labs given held dose on 9/19.  --Will give 1mg  warfarin tonight given addition of drug interactions described above --Will f/u w/ INR on AM labs --Daily INR ordered  , PharmD Pharmacy Resident  11/05/2020 12:47 PM

## 2020-11-05 NOTE — Progress Notes (Addendum)
PROGRESS NOTE    [name] [name]   NLZ:[id number]  DOB: October 07, 1960  PCP: [name], DO    DOA: 11/02/2020 LOS: 2    Brief Narrative / Hospital Course to Date:   60 year old male with past medical history of severe morbid obesity with BMI 62, OSA, chronic respiratory failure with hypoxia and hypercapnia on 4 L/min supplemental O2 and BiPAP at home, permanent A. fib on Coumadin, hyperlipidemia back hypertension, chronic anxiety/depression, chronic pain, gout, alcohol use disorder and frequent hospital admissions for COPD exacerbations.  He presented to the ED on 11/03/2020 with worsening shortness of breath with associated intermittent chest pain, nausea and vomiting.  Admitted to [facility] service for management of acute exacerbation of COPD.  Started on IV steroids and Zithromax in addition to bronchodilators.  Assessment & Plan   Active Problems:   COPD exacerbation (HCC)   COPD with acute exacerbation -continue IV steroids, Zithromax, bronchodilators  Acute on chronic hypoxemic and hypercapnic respiratory failure -continue BiPAP when asleep.  Supplemental oxygen to maintain sats above 88%  Adenovirus gastroenteritis -stool study positive for adenovirus.  Imodium as needed for diarrhea.  Encourage oral hydration.  Hypophosphatemia, hypomagnesemia -potassium and phosphorus have been replaced.  Monitor and replace further as needed.  Supratherapeutic INR -INR was 4.5 on admission.  Resolved  Permanent A. Fib -chronically on Coumadin.  Pharmacy consulted for warfarin dosing.   Follow INRs daily.  Chronic combined systolic/diastolic CHF - monitor volume status.  Low sodium and fluid restriction.  Resumed on home torsemide.  Elevated troponin due to demand ischemia -patient without chest pain or acute ischemic changes, not consistent with ACS.    Chronic pain / Right hip and shoulder pain due to osteoarthritis -scheduled Percocet with as needed oxycodone for  uncontrolled pain despite scheduled med.  Other chronic comorbidities: Alcohol use disorder, anxiety, depression, hypothyroidism, gout-hyperlipidemia -continue medications   Patient BMI: Body mass index is 62.34 kg/m.   DVT prophylaxis: SCDs Start: 11/03/20 1745 warfarin (COUMADIN) tablet 1 mg   Diet:  Diet Orders (From admission, onward)     Start     Ordered   11/03/20 1813  Diet Heart Room service appropriate? Yes; Fluid consistency: Thin  Diet effective now       Question Answer Comment  Room service appropriate? Yes   Fluid consistency: Thin      11/03/20 1812              Code Status: Full Code   Subjective 11/05/20    Patient awake resting in bed when seen this morning.  He reports uncontrolled pain in his hip and shoulder.  Reports also not getting any sleep and uses trazodone at home.  States that since he falls asleep people come in and wake him up.  Was given some Imodium and reports diarrhea has resolved.  Denies nausea vomiting fevers or chills or any other Acute complaints at this time.   Disposition Plan & Communication   Status is: Inpatient  Remains inpatient appropriate because:IV treatments appropriate due to intensity of illness or inability to take PO  Dispo:  Patient From: Home  Planned Disposition: Skilled Nursing Facility  Medically stable for discharge: No     Consults, Procedures, Significant Events   Consultants:  None  Procedures:  None  Antimicrobials:  Anti-infectives (From admission, onward)    Start     Dose/Rate Route Frequency Ordered Stop   11/04/20 1000  azithromycin (ZITHROMAX) tablet 250 mg  See Hyperspace for full Linked Orders Report.   250 mg Oral Daily 11/03/20 1821 11/08/20 0959   11/03/20 1900  azithromycin (ZITHROMAX) tablet 500 mg       See Hyperspace for full Linked Orders Report.   500 mg Oral Daily 11/03/20 1821 11/04/20 0959   11/03/20 1700  azithromycin (ZITHROMAX) 500 mg in sodium chloride 0.9 %  250 mL IVPB        500 mg 250 mL/hr over 60 Minutes Intravenous  Once 11/03/20 1648 11/03/20 1821         Micro    Objective   Vitals:   11/04/20 2355 11/05/20 0206 11/05/20 0220 11/05/20 1145  BP:  129/86  121/69  Pulse: 67 70  87  Resp:  18  17  Temp:  98.5 F (36.9 C)  98.7 F (37.1 C)  TempSrc:  Oral    SpO2: 99% 100% 100% 96%  Weight:      Height:       No intake or output data in the 24 hours ending 11/05/20 1514 Filed Weights   11/03/20 0002  Weight: (!) 186 kg    Physical Exam:  General exam: awake, alert, no acute distress, morbidly obese HEENT: atraumatic, clear conjunctiva, anicteric sclera, moist mucus membranes, hearing grossly normal  Respiratory system: Lungs overall clear but diminished throughout, normal respiratory effort, . Cardiovascular system: normal S1/S2, RRR, no pedal edema.   Central nervous system: A&O x3. no gross focal neurologic deficits, normal speech Extremities: Bilateral lower extremity edema and venous stasis dermatitis, normal tone Skin: Lower extremity patches of erythema without differential warmth, normal temperature Psychiatry: normal mood, congruent affect  Labs   Data Reviewed: I have personally reviewed following labs and imaging studies  CBC: Recent Labs  Lab 11/03/20 0156 11/04/20 0707  WBC 7.0 4.9  NEUTROABS 4.5  --   HGB 11.7* 10.3*  HCT 35.8* 31.9*  MCV 92.7 90.6  PLT 258 165   Basic Metabolic Panel: Recent Labs  Lab 11/03/20 0156 11/04/20 0707 11/05/20 0411  NA 134* 133* 132*  K 4.6 3.7 4.6  CL 89* 87* 88*  CO2 32 32 31  GLUCOSE 107* 156* 160*  BUN 19 17 18   CREATININE 0.72 0.69 0.75  CALCIUM 8.0* 8.0* 8.1*  MG  --  1.6* 2.1  PHOS  --  1.6* 2.6   GFR: Estimated Creatinine Clearance: 160.3 mL/min (by C-G formula based on SCr of 0.75 mg/dL). Liver Function Tests: No results for input(s): AST, ALT, ALKPHOS, BILITOT, PROT, ALBUMIN in the last 168 hours. No results for input(s): LIPASE, AMYLASE  in the last 168 hours. No results for input(s): AMMONIA in the last 168 hours. Coagulation Profile: Recent Labs  Lab 11/03/20 0416 11/04/20 0707 11/05/20 0411  INR 4.5* 3.3* 2.9*   Cardiac Enzymes: No results for input(s): CKTOTAL, CKMB, CKMBINDEX, TROPONINI in the last 168 hours. BNP (last 3 results) No results for input(s): PROBNP in the last 8760 hours. HbA1C: No results for input(s): HGBA1C in the last 72 hours. CBG: No results for input(s): GLUCAP in the last 168 hours. Lipid Profile: No results for input(s): CHOL, HDL, LDLCALC, TRIG, CHOLHDL, LDLDIRECT in the last 72 hours. Thyroid Function Tests: No results for input(s): TSH, T4TOTAL, FREET4, T3FREE, THYROIDAB in the last 72 hours. Anemia Panel: No results for input(s): VITAMINB12, FOLATE, FERRITIN, TIBC, IRON, RETICCTPCT in the last 72 hours. Sepsis Labs: No results for input(s): PROCALCITON, LATICACIDVEN in the last 168 hours.  Recent Results (from the past  240 hour(s))  SARS CORONAVIRUS 2 (TAT 6-24 HRS) Nasopharyngeal Nasopharyngeal Swab     Status: None   Collection Time: 11/03/20  3:19 AM   Specimen: Nasopharyngeal Swab  Result Value Ref Range Status   SARS Coronavirus 2 NEGATIVE NEGATIVE Final    Comment: (NOTE) SARS-CoV-2 target nucleic acids are NOT DETECTED.  The SARS-CoV-2 RNA is generally detectable in upper and lower respiratory specimens during the acute phase of infection. Negative results do not preclude SARS-CoV-2 infection, do not rule out co-infections with other pathogens, and should not be used as the sole basis for treatment or other patient management decisions. Negative results must be combined with clinical observations, patient history, and epidemiological information. The expected result is Negative.  Fact Sheet for Patients: HairSlick.no  Fact Sheet for Healthcare Providers: quierodirigir.com  This test is not yet approved or  cleared by the [place] FDA and  has been authorized for detection and/or diagnosis of SARS-CoV-2 by FDA under an Emergency Use Authorization (EUA). This EUA will remain  in effect (meaning this test can be used) for the duration of the COVID-19 declaration under Se ction 564(b)(1) of the Act, 21 U.S.C. section 360bbb-3(b)(1), unless the authorization is terminated or revoked sooner.  Performed at [facility], [address] [place], [place] [address]   Gastrointestinal Panel by PCR , Stool     Status: None   Collection Time: 11/04/20  3:47 AM   Specimen: Stool  Result Value Ref Range Status   Campylobacter species NOT DETECTED NOT DETECTED Final   Plesimonas shigelloides NOT DETECTED NOT DETECTED Final   Salmonella species NOT DETECTED NOT DETECTED Final   Yersinia enterocolitica NOT DETECTED NOT DETECTED Final   Vibrio species NOT DETECTED NOT DETECTED Final   Vibrio cholerae NOT DETECTED NOT DETECTED Final   Enteroaggregative E coli (EAEC) NOT DETECTED NOT DETECTED Final   Enteropathogenic E coli (EPEC) NOT DETECTED NOT DETECTED Final   Enterotoxigenic E coli (ETEC) NOT DETECTED NOT DETECTED Final   Shiga like toxin producing E coli (STEC) NOT DETECTED NOT DETECTED Final   Shigella/Enteroinvasive E coli (EIEC) NOT DETECTED NOT DETECTED Final   Cryptosporidium NOT DETECTED NOT DETECTED Final   Cyclospora cayetanensis NOT DETECTED NOT DETECTED Final   Entamoeba histolytica NOT DETECTED NOT DETECTED Final   Giardia lamblia NOT DETECTED NOT DETECTED Final   Adenovirus F40/41 NOT DETECTED NOT DETECTED Final   Astrovirus NOT DETECTED NOT DETECTED Final   Norovirus GI/GII NOT DETECTED NOT DETECTED Final   Rotavirus A NOT DETECTED NOT DETECTED Final   Sapovirus (I, II, IV, and V) NOT DETECTED NOT DETECTED Final    Comment: Performed at [facility], [address] [place], [place] [address]      Imaging Studies   No results found.   Medications    Scheduled Meds:  allopurinol  100 mg Oral Daily   amiodarone  200 mg Oral BID   aspirin EC  81 mg Oral Daily   atorvastatin  40 mg Oral Daily   azithromycin  250 mg Oral Daily   colchicine  0.6 mg Oral Daily   folic acid  1 mg Oral Daily   ipratropium-albuterol  3 mL Nebulization Q6H   [START ON 11/06/2020] levothyroxine  75 mcg Oral Q0600   methylPREDNISolone (SOLU-MEDROL) injection  40 mg Intravenous Q12H   multivitamin with minerals  1 tablet Oral Daily   oxyCODONE-acetaminophen  1 tablet Oral Q8H   phosphorus  500 mg Oral TID  spironolactone  12.5 mg Oral Daily   thiamine  100 mg Oral Daily   Or   thiamine  100 mg Intravenous Daily   torsemide  40 mg Oral Daily   traZODone  150 mg Oral QHS   warfarin  1 mg Oral ONCE-1600   Warfarin - Pharmacist Dosing Inpatient   Does not apply q1600   Continuous Infusions:     LOS: 2 days    Time spent: 30 minutes    Pennie Banter, DO [facility]  11/05/2020, 3:14 PM      If 7PM-7AM, please contact night-coverage. How to contact the [facility] Attending or Consulting provider 7A - 7P or covering provider during after hours 7P -7A, for this patient?    Check the care team in [facility] and look for a) attending/consulting TR[facility] provider listed and b) the [facility] team listed Log into [email or web address] and use [place]'s universal password to access. If you do not have the password, please contact the hospital operator. Locate the [facility] provider you are looking for under [facility] and page to a number that you can be directly reached. If you still have difficulty reaching the provider, please page the [facility] (Director on Call) for the Hospitalists listed on amion for assistance.

## 2020-11-05 NOTE — Progress Notes (Signed)
Placed patient on home CPAP per patient request.

## 2020-11-05 NOTE — Progress Notes (Signed)
PT Cancellation Note  Patient Details Name: [name] [name] MRN: [id number] DOB: 17-Nov-1960   Cancelled Treatment:    Reason Eval/Treat Not Completed: Patient declined to participate with PT services this date secondary to fatigue stating "Not today, I didn't sleep all night and I'm taking a nap." Will attempt to see pt at a future date/time as medically appropriate.     Ovidio Hanger PT, DPT 11/05/20, 2:28 PM

## 2020-11-05 NOTE — Progress Notes (Signed)
OT Cancellation Note  Patient Details Name: [name] [name] MRN: [id number] DOB: 07-24-1960   Cancelled Treatment:    Reason Eval/Treat Not Completed: Other (comment). OT order received and chart reviewed. Pt reports he does not want to be seen by OT this hospital admission. OT to SIGN OFF.  Jackquline Denmark, MS, OTR/L , CBIS ascom [phone]  11/05/20, 10:09 AM

## 2020-11-06 LAB — CBC
HCT: 34.8 % — ABNORMAL LOW (ref 39.0–52.0)
Hemoglobin: 11.3 g/dL — ABNORMAL LOW (ref 13.0–17.0)
MCH: 30.3 pg (ref 26.0–34.0)
MCHC: 32.5 g/dL (ref 30.0–36.0)
MCV: 93.3 fL (ref 80.0–100.0)
Platelets: 159 10*3/uL (ref 150–400)
RBC: 3.73 MIL/uL — ABNORMAL LOW (ref 4.22–5.81)
RDW: 16.4 % — ABNORMAL HIGH (ref 11.5–15.5)
WBC: 5.3 10*3/uL (ref 4.0–10.5)
nRBC: 0 % (ref 0.0–0.2)

## 2020-11-06 LAB — BASIC METABOLIC PANEL
Anion gap: 12 (ref 5–15)
BUN: 21 mg/dL — ABNORMAL HIGH (ref 6–20)
CO2: 36 mmol/L — ABNORMAL HIGH (ref 22–32)
Calcium: 8.4 mg/dL — ABNORMAL LOW (ref 8.9–10.3)
Chloride: 85 mmol/L — ABNORMAL LOW (ref 98–111)
Creatinine, Ser: 0.92 mg/dL (ref 0.61–1.24)
GFR, Estimated: >60 mL/min (ref >60)
Glucose, Bld: 189 mg/dL — ABNORMAL HIGH (ref 70–99)
Potassium: 3.7 mmol/L (ref 3.5–5.1)
Sodium: 133 mmol/L — ABNORMAL LOW (ref 135–145)

## 2020-11-06 LAB — PROTIME-INR
INR: 2.5 — ABNORMAL HIGH (ref 0.8–1.2)
Prothrombin Time: 27.2 seconds — ABNORMAL HIGH (ref 11.4–15.2)

## 2020-11-06 LAB — PHOSPHORUS: Phosphorus: 2.8 mg/dL (ref 2.5–4.6)

## 2020-11-06 LAB — MAGNESIUM: Magnesium: 1.6 mg/dL — ABNORMAL LOW (ref 1.7–2.4)

## 2020-11-06 MED ORDER — OXYCODONE-ACETAMINOPHEN 7.5-325 MG PO TABS: 1.0000 | ORAL_TABLET | Freq: Three times a day (TID) | ORAL | 0 refills | Status: AC

## 2020-11-06 MED ORDER — WARFARIN SODIUM 3 MG PO TABS: 3.0000 mg | ORAL_TABLET | Freq: Every day | ORAL | 0 refills | Status: DC

## 2020-11-06 MED ORDER — TRAZODONE HCL 150 MG PO TABS: 150.0000 mg | ORAL_TABLET | Freq: Every day | ORAL | Status: DC

## 2020-11-06 MED ORDER — CALAMINE EX LOTN: TOPICAL_LOTION | Freq: Two times a day (BID) | CUTANEOUS | 0 refills | Status: AC

## 2020-11-06 MED ORDER — WARFARIN SODIUM 4 MG PO TABS: 4.0000 mg | ORAL_TABLET | Freq: Every day | ORAL | 0 refills | Status: DC

## 2020-11-06 MED ORDER — ALPRAZOLAM 0.5 MG PO TABS: 0.5000 mg | ORAL_TABLET | Freq: Three times a day (TID) | ORAL | 0 refills | Status: AC | PRN

## 2020-11-06 MED ORDER — WARFARIN SODIUM 2.5 MG PO TABS
2.5000 mg | ORAL_TABLET | Freq: Once | ORAL | Status: DC
  Filled 2020-11-06: qty 1

## 2020-11-06 MED ORDER — ACETAMINOPHEN 325 MG PO TABS: 650.0000 mg | ORAL_TABLET | Freq: Four times a day (QID) | ORAL | Status: AC | PRN

## 2020-11-06 MED ORDER — CALAMINE EX LOTN
TOPICAL_LOTION | Freq: Two times a day (BID) | CUTANEOUS | Status: DC
  Administered 2020-11-06: 1 via TOPICAL
  Filled 2020-11-06: qty 177

## 2020-11-06 MED ORDER — ALBUTEROL SULFATE HFA 108 (90 BASE) MCG/ACT IN AERS: 2.0000 | INHALATION_SPRAY | Freq: Four times a day (QID) | RESPIRATORY_TRACT | 2 refills | Status: AC | PRN

## 2020-11-06 MED ORDER — LISINOPRIL 5 MG PO TABS
2.5000 mg | ORAL_TABLET | Freq: Every day | ORAL | Status: DC
  Administered 2020-11-06: 2.5 mg via ORAL
  Filled 2020-11-06: qty 1

## 2020-11-06 MED ORDER — CARVEDILOL 6.25 MG PO TABS
6.2500 mg | ORAL_TABLET | Freq: Two times a day (BID) | ORAL | Status: DC
  Administered 2020-11-06: 6.25 mg via ORAL
  Filled 2020-11-06: qty 1

## 2020-11-06 MED ORDER — OXYCODONE HCL 5 MG PO TABS: 5.0000 mg | ORAL_TABLET | Freq: Three times a day (TID) | ORAL | 0 refills | Status: AC | PRN

## 2020-11-06 MED ORDER — MAGNESIUM SULFATE 4 GM/100ML IV SOLN
4.0000 g | Freq: Once | INTRAVENOUS | Status: AC
  Administered 2020-11-06: 4 g via INTRAVENOUS
  Filled 2020-11-06: qty 100

## 2020-11-06 MED ORDER — LOPERAMIDE HCL 2 MG PO CAPS: 2.0000 mg | ORAL_CAPSULE | Freq: Four times a day (QID) | ORAL | 0 refills | Status: DC | PRN

## 2020-11-06 MED ORDER — IPRATROPIUM-ALBUTEROL 0.5-2.5 (3) MG/3ML IN SOLN: 3.0000 mL | Freq: Four times a day (QID) | RESPIRATORY_TRACT | Status: AC | PRN

## 2020-11-06 MED ORDER — LISINOPRIL 2.5 MG PO TABS: 2.5000 mg | ORAL_TABLET | Freq: Every day | ORAL | Status: AC

## 2020-11-06 MED ORDER — FOLIC ACID 1 MG PO TABS: 1.0000 mg | ORAL_TABLET | Freq: Every day | ORAL | Status: AC

## 2020-11-06 NOTE — Consult Note (Signed)
WOC Nurse Consult Note: Patient receiving care in [facility]. Reason for Consult: multiple wounds related to patient scratching. Wound type: scratch wounds on upper bilateral thighs. Chronic venous stasis changes to BLEs. Pressure Injury POA: Yes/No/NA Measurement: Wound bed: Drainage (amount, consistency, odor) blood spots on top sheet related to injury to skin from patient scratching thighs. Patient's body odor could be detected through a mask at the door entry to the room, despite the patient having been bathed by staff recently. Periwound: intact Dressing procedure/placement/frequency: The primary nurse reports the patient refused foam dressings, he "didn't like them".  So, I am approaching it from a BID application of Calamine lotion attempt, to see if this will reduce his urge to scratch.  Thank you for the consult.  Discussed plan of care with the patient and bedside nurse.  WOC nurse will not follow at this time.  Please re-consult the WOC team if needed.  Helmut Muster, RN, MSN, CWOCN, CNS-BC, pager [phone]

## 2020-11-06 NOTE — Evaluation (Signed)
Physical Therapy Evaluation Patient Details Name: [name] [name] MRN: [id number] DOB: April 28, 1960 Today's Date: 11/06/2020  History of Present Illness  Pt is a 60 y.o. male with medical history significant for severe morbid obesity, OSA on CPAP, permanent A. fib on Coumadin, hyperlipidemia, hypertension, chronic anxiety/depression, gout, alcohol use disorder, frequent admission for COPD exacerbation who presented to [facility] ED from home the evening of 11/02/2020 with complaints of shortness of breath of 3 days duration, progressively worsening. MD assessment includes: COPD with acute exacerbation, acute on chronic hypoxemic and hypercapnic respiratory failure, hypophosphatemia, hypomagnesemia, supratherapeutic INR, and elevated troponin due to demand ischemia.   Clinical Impression  Pt was pleasant and motivated to participate during the session but was quite limited by poor activity tolerance.  Pt was able to come to standing with extra time and effort from an elevated surface but was only able to take several very small steps at the EOB before needing to return to sitting secondary to SOB and fatigue.  Pt's SpO2 on 4LO2/min remained in the mid 90s throughout the session with HR increasing from the low 100s at rest to the 130s with amb.  Pt will benefit from PT services in a SNF setting upon discharge to safely address deficits listed in patient problem list for decreased caregiver assistance and eventual return to PLOF.         Recommendations for follow up therapy are one component of a multi-disciplinary discharge planning process, led by the attending physician.  Recommendations may be updated based on patient status, additional functional criteria and insurance authorization.  Follow Up Recommendations SNF;Supervision for mobility/OOB    Equipment Recommendations  None recommended by PT    Recommendations for Other Services       Precautions / Restrictions Precautions Precautions:  Fall Restrictions Weight Bearing Restrictions: No      Mobility  Bed Mobility               General bed mobility comments: NT, pt found sitting at the EOB    Transfers Overall transfer level: Needs assistance Equipment used: Rolling walker (2 wheeled) Transfers: Sit to/from Stand Sit to Stand: Min guard;From elevated surface         General transfer comment: Extra time and effort needed for transfers from an elevated surface  Ambulation/Gait Ambulation/Gait assistance: Min guard Gait Distance (Feet): 1 Feet Assistive device: Rolling walker (2 wheeled) Gait Pattern/deviations: Step-to pattern Gait velocity: decreased   General Gait Details: Pt able to take 2-3 very small shuffling steps at the EOB before needing to return to sitting secondary to SOB/fatigue; SpO2 on 4LO2/min remained in the mid 90s during the session with HR increasing from the low 100's to the 130's after amb  Stairs            Wheelchair Mobility    Modified Rankin (Stroke Patients Only)       Balance Overall balance assessment: Needs assistance   Sitting balance-Leahy Scale: Normal     Standing balance support: Bilateral upper extremity supported;During functional activity Standing balance-Leahy Scale: Fair Standing balance comment: Mod lean on the RW for support                             Pertinent Vitals/Pain Pain Assessment: 0-10 Pain Score: 9  Pain Location: R hip and R shoulder Pain Descriptors / Indicators: Aching;Sore Pain Intervention(s): Premedicated before session;Monitored during session    Home Living Family/patient expects to be  discharged to:: Assisted living Living Arrangements: Non-relatives/Friends Available Help at Discharge: Personal care attendant;Friend(s) Type of Home: House Home Access: Level entry     Home Layout: One level Home Equipment: Walker - 4 wheels;Shower seat;Hand held shower head;Wheelchair - manual      Prior Function  Level of Independence: Needs assistance   Gait / Transfers Assistance Needed: Rollator for HH mobility, w/c in the community; 2 falls in last 6 months secondary to chair flipping over and the other due to L knee buckling  ADL's / Homemaking Assistance Needed: PCA/roommate assist with ADLs, errands, general chores around the house        Hand Dominance   Dominant Hand: Right    Extremity/Trunk Assessment   Upper Extremity Assessment Upper Extremity Assessment: Generalized weakness    Lower Extremity Assessment Lower Extremity Assessment: Generalized weakness       Communication   Communication: No difficulties  Cognition Arousal/Alertness: Awake/alert Behavior During Therapy: WFL for tasks assessed/performed Overall Cognitive Status: Within Functional Limits for tasks assessed                                        General Comments      Exercises     Assessment/Plan    PT Assessment Patient needs continued PT services  PT Problem List Cardiopulmonary status limiting activity;Decreased strength;Decreased activity tolerance;Decreased balance;Decreased mobility;Decreased knowledge of use of DME;Pain       PT Treatment Interventions DME instruction;Gait training;Functional mobility training;Therapeutic activities;Therapeutic exercise;Balance training;Patient/family education    PT Goals (Current goals can be found in the Care Plan section)  Acute Rehab PT Goals Patient Stated Goal: To get stronger PT Goal Formulation: With patient Time For Goal Achievement: 11/19/20 Potential to Achieve Goals: Fair    Frequency Min 2X/week   Barriers to discharge Decreased caregiver support;Inaccessible home environment      Co-evaluation               AM-PAC PT "6 Clicks" Mobility  Outcome Measure Help needed turning from your back to your side while in a flat bed without using bedrails?: A Little Help needed moving from lying on your back to sitting  on the side of a flat bed without using bedrails?: A Little Help needed moving to and from a bed to a chair (including a wheelchair)?: A Little Help needed standing up from a chair using your arms (e.g., wheelchair or bedside chair)?: A Little Help needed to walk in hospital room?: Total Help needed climbing 3-5 steps with a railing? : Total 6 Click Score: 14    End of Session Equipment Utilized During Treatment: Oxygen;Gait belt Activity Tolerance: Patient limited by fatigue Patient left: in bed;with nursing/sitter in room;with call bell/phone within reach;Other (comment) (Pt left sitting at EOB as found with nursing present who stated that pt does not require a bed alarm) Nurse Communication: Mobility status PT Visit Diagnosis: Muscle weakness (generalized) (M62.81);Unsteadiness on feet (R26.81);Difficulty in walking, not elsewhere classified (R26.2);Pain Pain - Right/Left: Right Pain - part of body: Shoulder;Hip    Time: 9233-0076 PT Time Calculation (min) (ACUTE ONLY): 28 min   Charges:   PT Evaluation $PT Eval Moderate Complexity: 1 Mod          D. Scott Jacoba Cherney PT, DPT 11/06/20, 11:28 AM

## 2020-11-06 NOTE — Progress Notes (Addendum)
ANTICOAGULATION CONSULT NOTE - Follow Up Consult  Pharmacy Consult for Warfarin Indication: atrial fibrillation  No Known Allergies  Patient Measurements: Height: 5\' 8"  (172.7 cm) Weight: (!) 186 kg (410 lb) IBW/kg (Calculated) : 68.4  Vital Signs: Temp: 97.9 F (36.6 C) (09/22 0502) Temp Source: Oral (09/21 2053) BP: 132/78 (09/22 0502) Pulse Rate: 94 (09/22 0502)  Labs: Recent Labs    11/04/20 0707 11/05/20 0411 11/06/20 0450  HGB 10.3*  --  11.3*  HCT 31.9*  --  34.8*  PLT 165  --  159  LABPROT 33.3* 30.2* 27.2*  INR 3.3* 2.9* 2.5*  CREATININE 0.69 0.75 0.92     Estimated Creatinine Clearance: 139.4 mL/min (by C-G formula based on SCr of 0.92 mg/dL).   Medications:  Scheduled:   allopurinol  100 mg Oral Daily   amiodarone  200 mg Oral BID   aspirin EC  81 mg Oral Daily   atorvastatin  40 mg Oral Daily   azithromycin  250 mg Oral Daily   colchicine  0.6 mg Oral Daily   folic acid  1 mg Oral Daily   levothyroxine  75 mcg Oral Q0600   multivitamin with minerals  1 tablet Oral Daily   oxyCODONE-acetaminophen  1 tablet Oral Q8H   phosphorus  500 mg Oral TID   spironolactone  12.5 mg Oral Daily   thiamine  100 mg Oral Daily   Or   thiamine  100 mg Intravenous Daily   torsemide  40 mg Oral Daily   traZODone  150 mg Oral QHS   Warfarin - Pharmacist Dosing Inpatient   Does not apply q1600    Assessment: 60 y.o. male with medical history significant for severe morbid obesity with BMI of 62, OSA on CPAP, permanent A. fib on Coumadin, hyperlipidemia, hypertension, chronic anxiety/depression, gout, alcohol use disorder, frequent admission for COPD exacerbation. Pharmacy consulted for warfarin management.   DDI include: azithromycin (potential to increase INR), methylprednisolone (potential to increase INR),  torsemide (potential to increase INR), amiodarone (increases INR), MVI as consistent source dietary vitK, ASA (increases bleeding risk), levothyroxine for  hypothyroidism (hypothyroidism can increase bleeding time)    Home dose warfarin 6mg  daily  Date  INR Dose 9/19 4.5 HELD 9/20 3.3 1mg  9/21 2.9 1mg          9/22 2.5 4mg   Goal of Therapy:  INR 2-3 Monitor platelets by anticoagulation protocol: Yes   Plan:  --INR therapeutic and trending down. Expect INR to decrease w/ AM labs given held dose on 9/19 and reduced doses compared to home regimen given while admitted.  --Per d/w MD, will give 4mg  warfarin tonight since planning to discharge to SNF and anticipate antibiotic and steroid discontinuation. --Will f/u w/ INR on AM labs --Daily INR ordered; If INR falls below 2, MD agreed to allow pharmacy to bridge warfarin with lovenox until INR>2.   10/21, PharmD Pharmacy Resident  11/06/2020 11:54 AM

## 2020-11-06 NOTE — Progress Notes (Signed)
Pt is now using his cpap from home.

## 2020-11-06 NOTE — Discharge Summary (Addendum)
Physician Discharge Summary  [name] [name] ZOX:[id number] DOB: 19-Jul-1960 DOA: 11/02/2020  PCP: [name], DO  Admit date: 11/02/2020 Discharge date: 11/06/2020  Admitted From: home Disposition:  SNF  Recommendations for Outpatient Follow-up:  Follow up with PCP in 1-2 weeks Please obtain BMP/CBC in one week Please follow up on management of patient's chronic pain - would benefit from referral to pain management.   Home Health: No  Equipment/Devices: None   Discharge Condition: Stable  CODE STATUS: Full  Diet recommendation: Heart Healthy      Discharge Diagnoses: Active Problems:   COPD exacerbation (HCC)    Summary of HPI and Hospital Course:   60 year old male with past medical history of severe morbid obesity with BMI 62, OSA, chronic respiratory failure with hypoxia and hypercapnia on 4 L/min supplemental O2 and BiPAP at home, permanent A. fib on Coumadin, hyperlipidemia back hypertension, chronic anxiety/depression, chronic pain, gout, alcohol use disorder and frequent hospital admissions for COPD exacerbations.  He presented to the ED on 11/03/2020 with worsening shortness of breath with associated intermittent chest pain, nausea and vomiting.  Admitted to [facility] service for management of acute exacerbation of COPD.  Started on IV steroids and Zithromax in addition to bronchodilators.   COPD with acute exacerbation -Treated with IV steroids, Zithromax, bronchodilators.  Patient has clinically improved, no longer wheezing or short of breath.  Stable for discharge today No further steroid or antibiotic needed at discharge. Continue bronchodilators as below.   Acute on chronic hypoxemic and hypercapnic respiratory failure -continue BiPAP when asleep.  Supplemental oxygen to maintain sats above 88% Baseline 4 L/min O2 requirement - has been stable.  Adenovirus gastroenteritis -stool study positive for adenovirus.   Imodium as needed for diarrhea.     Hypophosphatemia, hypomagnesemia -potassium and phosphorus have been replaced.   Monitor and replace further as needed.   Supratherapeutic INR -INR was 4.5 on admission.  Resolved with holding of warfarin which is now resumed as below.   Permanent A. Fib -chronically on Coumadin.   Pharmacy consulted for warfarin dosing.   INR today is 2.5 Continue warfarin at 4 mg this evening. Closely folllow INR trend and adjust dose.   Chronic combined systolic/diastolic CHF - monitor volume status - daily weights.   Low sodium and fluid restriction.   Continue torsemide. Follow up with cardiology as previously scheduled or sooner if needed.  Elevated troponin due to demand ischemia -patient without chest pain or acute ischemic changes, not consistent with ACS.     Chronic pain / Right hip and shoulder pain due to osteoarthritis -scheduled Percocet with as needed oxycodone for uncontrolled pain despite scheduled med. --Please refer to pain management for ongoing care   Other chronic comorbidities: Alcohol use disorder, anxiety, depression, hypothyroidism, gout-hyperlipidemia -continue medications as below      Discharge Instructions   Discharge Instructions     (HEART FAILURE PATIENTS) Call MD:  Anytime you have any of the following symptoms: 1) 3 pound weight gain in 24 hours or 5 pounds in 1 week 2) shortness of breath, with or without a dry hacking cough 3) swelling in the hands, feet or stomach 4) if you have to sleep on extra pillows at night in order to breathe.   Complete by: As directed    Call MD for:  extreme fatigue   Complete by: As directed    Call MD for:  persistant dizziness or light-headedness   Complete by: As directed    Call MD  for:  persistant nausea and vomiting   Complete by: As directed    Call MD for:  severe uncontrolled pain   Complete by: As directed    Call MD for:  temperature >100.4   Complete by: As directed    Diet - low sodium heart healthy   Complete  by: As directed    Increase activity slowly   Complete by: As directed       Allergies as of 11/06/2020   No Known Allergies      Medication List     STOP taking these medications    diclofenac Sodium 1 % Gel Commonly known as: VOLTAREN   DULoxetine 20 MG capsule Commonly known as: CYMBALTA   guaiFENesin-dextromethorphan 100-10 MG/5ML syrup Commonly known as: ROBITUSSIN DM   metoprolol succinate 100 MG 24 hr tablet Commonly known as: TOPROL-XL   montelukast 10 MG tablet Commonly known as: SINGULAIR   naphazoline-glycerin 0.012-0.2 % Soln Commonly known as: CLEAR EYES REDNESS   sacubitril-valsartan 24-26 MG Commonly known as: ENTRESTO   SUMAtriptan 50 MG tablet Commonly known as: IMITREX       TAKE these medications    acetaminophen 325 MG tablet Commonly known as: TYLENOL Take 2 tablets (650 mg total) by mouth every 6 (six) hours as needed for mild pain, fever or headache.   albuterol 108 (90 Base) MCG/ACT inhaler Commonly known as: VENTOLIN HFA Inhale 2 puffs into the lungs every 6 (six) hours as needed for wheezing or shortness of breath. What changed:  when to take this Another medication with the same name was removed. Continue taking this medication, and follow the directions you see here.   allopurinol 100 MG tablet Commonly known as: ZYLOPRIM Take 1 tablet (100 mg total) by mouth daily.   ALPRAZolam 0.5 MG tablet Commonly known as: Xanax Take 1 tablet (0.5 mg total) by mouth 3 (three) times daily as needed for up to 5 days for sleep or anxiety.   amiodarone 400 MG tablet Commonly known as: PACERONE Take 0.5 tablets (200 mg total) by mouth 2 (two) times daily.   aspirin EC 81 MG tablet Take 1 tablet (81 mg total) by mouth daily. Swallow whole.   atorvastatin 40 MG tablet Commonly known as: LIPITOR Take 1 tablet (40 mg total) by mouth daily.   calamine lotion Apply topically 2 (two) times daily.   carvedilol 6.25 MG tablet Commonly  known as: COREG Take 1 tablet (6.25 mg total) by mouth 2 (two) times daily with a meal.   colchicine 0.6 MG tablet Take 1 tablet (0.6 mg total) by mouth daily.   folic acid 1 MG tablet Commonly known as: FOLVITE Take 1 tablet (1 mg total) by mouth daily. Start taking on: November 07, 2020   ipratropium-albuterol 0.5-2.5 (3) MG/3ML Soln Commonly known as: DUONEB Take 3 mLs by nebulization every 6 (six) hours as needed.   levothyroxine 75 MCG tablet Commonly known as: SYNTHROID Take 1 tablet (75 mcg total) by mouth daily before breakfast.   lisinopril 2.5 MG tablet Commonly known as: ZESTRIL Take 1 tablet (2.5 mg total) by mouth daily.   loperamide 2 MG capsule Commonly known as: IMODIUM Take 1 capsule (2 mg total) by mouth 4 (four) times daily as needed for diarrhea or loose stools.   multivitamin with minerals Tabs tablet Take 1 tablet by mouth daily.   nitroGLYCERIN 0.4 MG SL tablet Commonly known as: NITROSTAT Place 1 tablet under tongue every 5 minutes as needed for chest  pain. (No more than 3 doses within 15 minutes)   oxyCODONE 5 MG immediate release tablet Commonly known as: Oxy IR/ROXICODONE Take 1 tablet (5 mg total) by mouth every 8 (eight) hours as needed for up to 5 days (uncontrolled pain between scheduled Percocet doses).   oxyCODONE-acetaminophen 7.5-325 MG tablet Commonly known as: PERCOCET Take 1 tablet by mouth every 8 (eight) hours for 5 days. What changed:  when to take this reasons to take this Another medication with the same name was removed. Continue taking this medication, and follow the directions you see here.   predniSONE 20 MG tablet Commonly known as: DELTASONE Take 3 tablets (60 mg total) by mouth daily with breakfast for 5 days.   spironolactone 25 MG tablet Commonly known as: ALDACTONE Take 0.5 tablets (12.5 mg total) by mouth daily.   torsemide 20 MG tablet Commonly known as: DEMADEX Take 2 tablets (40 mg total) by mouth  daily.   traZODone 150 MG tablet Commonly known as: DESYREL Take 1 tablet (150 mg total) by mouth at bedtime.   Trelegy Ellipta 100-62.5-25 MCG/INH Aepb Generic drug: Fluticasone-Umeclidin-Vilant Inhale 1 Inhaler into the lungs daily.   warfarin 4 MG tablet Commonly known as: COUMADIN Take 1 tablet (4 mg total) by mouth daily. What changed:  medication strength how much to take Another medication with the same name was removed. Continue taking this medication, and follow the directions you see here.        Follow-up Information     [name], DO. Schedule an appointment as soon as possible for a visit in 2 days.   Specialty: Family Medicine Contact information: [address] [place] [address] [phone]                No Known Allergies   If you experience worsening of your admission symptoms, develop shortness of breath, life threatening emergency, suicidal or homicidal thoughts you must seek medical attention immediately by calling 911 or calling your MD immediately  if symptoms less severe.    Please note   You were cared for by a hospitalist during your hospital stay. If you have any questions about your discharge medications or the care you received while you were in the hospital after you are discharged, you can call the unit and asked to speak with the hospitalist on call if the hospitalist that took care of you is not available. Once you are discharged, your primary care physician will handle any further medical issues. Please note that NO REFILLS for any discharge medications will be authorized once you are discharged, as it is imperative that you return to your primary care physician (or establish a relationship with a primary care physician if you do not have one) for your aftercare needs so that they can reassess your need for medications and monitor your lab values.   Consultations: None    Procedures/Studies: DG Shoulder  Right  Result Date: 10/22/2020 CLINICAL DATA:  60 year old male with chronic right shoulder pain. EXAM: RIGHT SHOULDER - 2+ VIEW COMPARISON:  Right shoulder series 04/20/2020. FINDINGS: Bone mineralization is within normal limits for age. No glenohumeral joint dislocation. Proximal right humerus is intact. Right clavicle and scapula appear intact. Stable mild to moderate chronic right glenohumeral joint space loss with mild subchondral sclerosis. Negative visible right ribs and chest. IMPRESSION: Mild to moderate chronic glenohumeral degeneration. No acute osseous abnormality identified. Electronically Signed   By: Odessa Fleming M.D.   On: 10/22/2020 04:09  CT Angio Chest Pulmonary Embolism (PE) W or WO Contrast  Result Date: 10/09/2020 CLINICAL DATA:  Dyspnea, nausea, hypoxia, productive cough EXAM: CT ANGIOGRAPHY CHEST WITH CONTRAST TECHNIQUE: Multidetector CT imaging of the chest was performed using the standard protocol during bolus administration of intravenous contrast. Multiplanar CT image reconstructions and MIPs were obtained to evaluate the vascular anatomy. CONTRAST:  OMNIPAQUE IOHEXOL 350 MG/ML SOLN COMPARISON:  09/23/2020 FINDINGS: Cardiovascular: Opacification of the pulmonary arterial tree is suboptimal due to bolus timing and respiratory motion. The examination is adequate only for exclusion of large central pulmonary emboli within the main and central right and left pulmonary arteries. The lobar, segmental, and subsegmental pulmonary arteries are not adequately opacified to definitively exclude the presence of pulmonary embolism. The central pulmonary arteries are mildly enlarged in keeping with changes of pulmonary arterial hypertension. Extensive multi-vessel coronary artery calcification. Mild global cardiomegaly is stable. No pericardial effusion. The thoracic aorta is of normal caliber. Mild atherosclerotic calcification within the aorta. Bovine arch anatomy. Wide patency of the arch  vasculature proximally. Mediastinum/Nodes: No enlarged mediastinal, hilar, or axillary lymph nodes. Thyroid gland, trachea, and esophagus demonstrate no significant findings. Lungs/Pleura: Mild left basilar atelectasis. The lungs are otherwise clear. No pneumothorax or pleural effusion. The central airways are widely patent. Upper Abdomen: At least moderate hepatic steatosis. No acute abnormality. Musculoskeletal: Degenerative changes are seen within the thoracic spine. No acute bone abnormality. No lytic or blastic bone lesion identified. Subacute to remote right seventh rib fracture noted anteriorly. Review of the MIP images confirms the above findings. IMPRESSION: Technically limited examination due to suboptimal bolus timing. No evidence of large central pulmonary embolus involving the main, central right, and central left pulmonary arteries. Extensive coronary artery calcification.  Stable cardiomegaly. Morphologic changes in keeping with pulmonary arterial hypertension. Moderate hepatic steatosis. Aortic Atherosclerosis (ICD10-I70.0). Electronically Signed   By: Helyn Numbers M.D.   On: 10/09/2020 02:00   DG Chest Portable 1 View  Result Date: 11/03/2020 CLINICAL DATA:  Dyspnea EXAM: PORTABLE CHEST 1 VIEW COMPARISON:  10/08/2020 FINDINGS: Lung volumes are small, but are stable since prior examination and are clear. No pneumothorax or pleural effusion. Cardiac size is mildly enlarged, stable. Pulmonary vascularity is normal. No acute bone abnormality. IMPRESSION: No active disease.  Stable cardiomegaly. Electronically Signed   By: Helyn Numbers M.D.   On: 11/03/2020 00:38   DG Chest Portable 1 View  Result Date: 10/08/2020 CLINICAL DATA:  Hypoxia EXAM: PORTABLE CHEST 1 VIEW COMPARISON:  10/08/2020 FINDINGS: Shallow inspiration. Cardiac enlargement. Pulmonary vascular congestion with perihilar infiltration or edema. No pleural effusions. No pneumothorax. IMPRESSION: Cardiac enlargement with perihilar  infiltration suggesting pneumonia or edema. Similar appearance to previous study. Electronically Signed   By: Burman Nieves M.D.   On: 10/08/2020 23:30   DG Chest Portable 1 View  Result Date: 10/08/2020 CLINICAL DATA:  Shortness of breath and respiratory distress. EXAM: PORTABLE CHEST 1 VIEW COMPARISON:  Radiograph and CT 09/23/2020 FINDINGS: Chronic unchanged cardiomegaly. Hilar prominence related to known central pulmonary vasculature. Diffuse increased density throughout the right hemithorax. No pneumothorax. IMPRESSION: Diffuse increased density throughout the right hemithorax may be asymmetric pulmonary edema, multifocal pneumonia or less likely layering effusion. Cardiomegaly stable. Electronically Signed   By: Narda Rutherford M.D.   On: 10/08/2020 16:58   DG Hip Unilat W or Wo Pelvis 2-3 Views Right  Result Date: 10/22/2020 CLINICAL DATA:  60 year old male with chronic right hip pain. EXAM: DG HIP (WITH OR WITHOUT PELVIS) 2-3V RIGHT  COMPARISON:  Right hip CT 03/05/2019 and earlier. FINDINGS: Severe asymmetric right hip joint space loss. Bone-on-bone appearance. Associated chronic subchondral sclerosis and evidence of the large subchondral cysts demonstrated by CT last year. The proximal right femur remains intact. Pelvis and proximal left femur appear intact. SI joints are within normal limits. Chronic degeneration also noted at the pubic symphysis. Bilateral pelvic vascular calcifications. IMPRESSION: 1. Chronic and severe right hip osteoarthritis. 2. No acute fracture or dislocation identified. Electronically Signed   By: Odessa Fleming M.D.   On: 10/22/2020 04:07       Subjective: Pt seated edge of bed this AM.  Reports just off phone with social worker and able to go to SNF today.  He says he'll do much better there, hoping to get more sleep than he does here.  Reports still be very weak but otherwise no complaints.  No SOB or wheezing or chest pain.   Discharge Exam: Vitals:   11/06/20  0832 11/06/20 1235  BP: 132/84 (!) 146/96  Pulse: (!) 106 97  Resp: 20 20  Temp: 98.4 F (36.9 C) 98.4 F (36.9 C)  SpO2: 96% 99%   Vitals:   11/06/20 0005 11/06/20 0502 11/06/20 0832 11/06/20 1235  BP:  132/78 132/84 (!) 146/96  Pulse:  94 (!) 106 97  Resp:  Temp:  97.9 F (36.6 C) 98.4 F (36.9 C) 98.4 F (36.9 C)  TempSrc:   Oral Oral  SpO2: 93% 98% 96% 99%  Weight:      Height:        General: Pt is alert, awake, not in acute distress, morbidly obese Cardiovascular: RRR, S1/S2 +, no rubs, no gallops Respiratory: CTA bilaterally, no wheezing, no rhonchi Abdominal: Soft, NT, ND Extremities: stable lower extremity edema, lower extremities with stable patches of erythema    The results of significant diagnostics from this hospitalization (including imaging, microbiology, ancillary and laboratory) are listed below for reference.     Microbiology: Recent Results (from the past 240 hour(s))  SARS CORONAVIRUS 2 (TAT 6-24 HRS) Nasopharyngeal Nasopharyngeal Swab     Status: None   Collection Time: 11/03/20  3:19 AM   Specimen: Nasopharyngeal Swab  Result Value Ref Range Status   SARS Coronavirus 2 NEGATIVE NEGATIVE Final    Comment: (NOTE) SARS-CoV-2 target nucleic acids are NOT DETECTED.  The SARS-CoV-2 RNA is generally detectable in upper and lower respiratory specimens during the acute phase of infection. Negative results do not preclude SARS-CoV-2 infection, do not rule out co-infections with other pathogens, and should not be used as the sole basis for treatment or other patient management decisions. Negative results must be combined with clinical observations, patient history, and epidemiological information. The expected result is Negative.  Fact Sheet for Patients: HairSlick.no  Fact Sheet for Healthcare Providers: quierodirigir.com  This test is not yet approved or cleared by the [place] FDA and  has been authorized for detection and/or diagnosis of SARS-CoV-2 by FDA under an Emergency Use Authorization (EUA). This EUA will remain  in effect (meaning this test can be used) for the duration of the COVID-19 declaration under Se ction 564(b)(1) of the Act, 21 U.S.C. section 360bbb-3(b)(1), unless the authorization is terminated or revoked sooner.  Performed at [facility], [address] [place], [place] [address]   Gastrointestinal Panel by PCR , Stool     Status: None   Collection Time: 11/04/20  3:47 AM   Specimen: Stool  Result  Value Ref Range Status   Campylobacter species NOT DETECTED NOT DETECTED Final   Plesimonas shigelloides NOT DETECTED NOT DETECTED Final   Salmonella species NOT DETECTED NOT DETECTED Final   Yersinia enterocolitica NOT DETECTED NOT DETECTED Final   Vibrio species NOT DETECTED NOT DETECTED Final   Vibrio cholerae NOT DETECTED NOT DETECTED Final   Enteroaggregative E coli (EAEC) NOT DETECTED NOT DETECTED Final   Enteropathogenic E coli (EPEC) NOT DETECTED NOT DETECTED Final   Enterotoxigenic E coli (ETEC) NOT DETECTED NOT DETECTED Final   Shiga like toxin producing E coli (STEC) NOT DETECTED NOT DETECTED Final   Shigella/Enteroinvasive E coli (EIEC) NOT DETECTED NOT DETECTED Final   Cryptosporidium NOT DETECTED NOT DETECTED Final   Cyclospora cayetanensis NOT DETECTED NOT DETECTED Final   Entamoeba histolytica NOT DETECTED NOT DETECTED Final   Giardia lamblia NOT DETECTED NOT DETECTED Final   Adenovirus F40/41 NOT DETECTED NOT DETECTED Final   Astrovirus NOT DETECTED NOT DETECTED Final   Norovirus GI/GII NOT DETECTED NOT DETECTED Final   Rotavirus A NOT DETECTED NOT DETECTED Final   Sapovirus (I, II, IV, and V) NOT DETECTED NOT DETECTED Final    Comment: Performed at [facility], [address] [place], [place] [address]     Labs: BNP (last 3 results) Recent Labs    09/23/20 0755 10/08/20 1542  11/03/20 0156  BNP 133.4* 171.3* 80.5   Basic Metabolic Panel: Recent Labs  Lab 11/03/20 0156 11/04/20 0707 11/05/20 0411 11/06/20 0450  NA 134* 133* 132* 133*  K 4.6 3.7 4.6 3.7  CL 89* 87* 88* 85*  CO2 32 32 31 36*  GLUCOSE 107* 156* 160* 189*  BUN 19 17 18  21*  CREATININE 0.72 0.69 0.75 0.92  CALCIUM 8.0* 8.0* 8.1* 8.4*  MG  --  1.6* 2.1 1.6*  PHOS  --  1.6* 2.6 2.8   Liver Function Tests: No results for input(s): AST, ALT, ALKPHOS, BILITOT, PROT, ALBUMIN in the last 168 hours. No results for input(s): LIPASE, AMYLASE in the last 168 hours. No results for input(s): AMMONIA in the last 168 hours. CBC: Recent Labs  Lab 11/03/20 0156 11/04/20 0707 11/06/20 0450  WBC 7.0 4.9 5.3  NEUTROABS 4.5  --   --   HGB 11.7* 10.3* 11.3*  HCT 35.8* 31.9* 34.8*  MCV 92.7 90.6 93.3  PLT 258 165 159   Cardiac Enzymes: No results for input(s): CKTOTAL, CKMB, CKMBINDEX, TROPONINI in the last 168 hours. BNP: Invalid input(s): POCBNP CBG: No results for input(s): GLUCAP in the last 168 hours. D-Dimer No results for input(s): DDIMER in the last 72 hours. Hgb A1c No results for input(s): HGBA1C in the last 72 hours. Lipid Profile No results for input(s): CHOL, HDL, LDLCALC, TRIG, CHOLHDL, LDLDIRECT in the last 72 hours. Thyroid function studies No results for input(s): TSH, T4TOTAL, T3FREE, THYROIDAB in the last 72 hours.  Invalid input(s): FREET3 Anemia work up No results for input(s): VITAMINB12, FOLATE, FERRITIN, TIBC, IRON, RETICCTPCT in the last 72 hours. Urinalysis    Component Value Date/Time   COLORURINE YELLOW (A) 10/08/2020 1656   APPEARANCEUR HAZY (A) 10/08/2020 1656   APPEARANCEUR Hazy (A) 12/25/2018 1442   LABSPEC 1.019 10/08/2020 1656   PHURINE 5.0 10/08/2020 1656   GLUCOSEU NEGATIVE 10/08/2020 1656   HGBUR SMALL (A) 10/08/2020 1656   BILIRUBINUR NEGATIVE 10/08/2020 1656   BILIRUBINUR Negative 12/25/2018 1442   KETONESUR NEGATIVE 10/08/2020 1656    PROTEINUR 30 (A) 10/08/2020 1656   NITRITE  NEGATIVE 10/08/2020 1656   LEUKOCYTESUR TRACE (A) 10/08/2020 1656   Sepsis Labs Invalid input(s): PROCALCITONIN,  WBC,  LACTICIDVEN Microbiology Recent Results (from the past 240 hour(s))  SARS CORONAVIRUS 2 (TAT 6-24 HRS) Nasopharyngeal Nasopharyngeal Swab     Status: None   Collection Time: 11/03/20  3:19 AM   Specimen: Nasopharyngeal Swab  Result Value Ref Range Status   SARS Coronavirus 2 NEGATIVE NEGATIVE Final    Comment: (NOTE) SARS-CoV-2 target nucleic acids are NOT DETECTED.  The SARS-CoV-2 RNA is generally detectable in upper and lower respiratory specimens during the acute phase of infection. Negative results do not preclude SARS-CoV-2 infection, do not rule out co-infections with other pathogens, and should not be used as the sole basis for treatment or other patient management decisions. Negative results must be combined with clinical observations, patient history, and epidemiological information. The expected result is Negative.  Fact Sheet for Patients: HairSlick.no  Fact Sheet for Healthcare Providers: quierodirigir.com  This test is not yet approved or cleared by the [place] FDA and  has been authorized for detection and/or diagnosis of SARS-CoV-2 by FDA under an Emergency Use Authorization (EUA). This EUA will remain  in effect (meaning this test can be used) for the duration of the COVID-19 declaration under Se ction 564(b)(1) of the Act, 21 U.S.C. section 360bbb-3(b)(1), unless the authorization is terminated or revoked sooner.  Performed at [facility], [address] [place], [place] [address]   Gastrointestinal Panel by PCR , Stool     Status: None   Collection Time: 11/04/20  3:47 AM   Specimen: Stool  Result Value Ref Range Status   Campylobacter species NOT DETECTED NOT DETECTED Final   Plesimonas shigelloides NOT DETECTED NOT DETECTED  Final   Salmonella species NOT DETECTED NOT DETECTED Final   Yersinia enterocolitica NOT DETECTED NOT DETECTED Final   Vibrio species NOT DETECTED NOT DETECTED Final   Vibrio cholerae NOT DETECTED NOT DETECTED Final   Enteroaggregative E coli (EAEC) NOT DETECTED NOT DETECTED Final   Enteropathogenic E coli (EPEC) NOT DETECTED NOT DETECTED Final   Enterotoxigenic E coli (ETEC) NOT DETECTED NOT DETECTED Final   Shiga like toxin producing E coli (STEC) NOT DETECTED NOT DETECTED Final   Shigella/Enteroinvasive E coli (EIEC) NOT DETECTED NOT DETECTED Final   Cryptosporidium NOT DETECTED NOT DETECTED Final   Cyclospora cayetanensis NOT DETECTED NOT DETECTED Final   Entamoeba histolytica NOT DETECTED NOT DETECTED Final   Giardia lamblia NOT DETECTED NOT DETECTED Final   Adenovirus F40/41 NOT DETECTED NOT DETECTED Final   Astrovirus NOT DETECTED NOT DETECTED Final   Norovirus GI/GII NOT DETECTED NOT DETECTED Final   Rotavirus A NOT DETECTED NOT DETECTED Final   Sapovirus (I, II, IV, and V) NOT DETECTED NOT DETECTED Final    Comment: Performed at [facility], [address] [place], [place] [address]     Time coordinating discharge: Over 30 minutes  SIGNED:   Pennie Banter, DO [facility] 11/06/2020, 12:40 PM   If 7PM-7AM, please contact night-coverage [email or web address]

## 2020-11-06 NOTE — Plan of Care (Signed)
  Problem: Education: Goal: Knowledge of General Education information will improve Description: Including pain rating scale, medication(s)/side effects and non-pharmacologic comfort measures Outcome: Adequate for Discharge   Problem: Health Behavior/Discharge Planning: Goal: Ability to manage health-related needs will improve Outcome: Adequate for Discharge   Problem: Clinical Measurements: Goal: Ability to maintain clinical measurements within normal limits will improve Outcome: Adequate for Discharge Goal: Will remain free from infection Outcome: Adequate for Discharge Goal: Diagnostic test results will improve Outcome: Adequate for Discharge Goal: Respiratory complications will improve Outcome: Adequate for Discharge Goal: Cardiovascular complication will be avoided Outcome: Adequate for Discharge   Problem: Activity: Goal: Risk for activity intolerance will decrease Outcome: Adequate for Discharge   Problem: Nutrition: Goal: Adequate nutrition will be maintained Outcome: Adequate for Discharge   Problem: Coping: Goal: Level of anxiety will decrease Outcome: Adequate for Discharge   Problem: Elimination: Goal: Will not experience complications related to bowel motility Outcome: Adequate for Discharge Goal: Will not experience complications related to urinary retention Outcome: Adequate for Discharge   Problem: Acute Rehab PT Goals(only PT should resolve) Goal: Pt Will Go Sit To Supine/Side Outcome: Adequate for Discharge Goal: Pt Will Transfer Bed To Chair/Chair To Bed Outcome: Adequate for Discharge Goal: Pt Will Ambulate Outcome: Adequate for Discharge   Problem: Skin Integrity: Goal: Risk for impaired skin integrity will decrease Outcome: Adequate for Discharge   Problem: Safety: Goal: Ability to remain free from injury will improve Outcome: Adequate for Discharge

## 2020-11-14 ENCOUNTER — Encounter: Payer: Self-pay | Admitting: Nurse Practitioner

## 2020-11-14 ENCOUNTER — Other Ambulatory Visit: Payer: Self-pay

## 2020-11-14 ENCOUNTER — Non-Acute Institutional Stay: Payer: Medicaid Other | Admitting: Nurse Practitioner

## 2020-11-14 VITALS — BP 167/99 | HR 85 | Temp 98.9°F | Resp 20 | Wt >= 6400 oz

## 2020-11-14 DIAGNOSIS — G8929 Other chronic pain: Secondary | ICD-10-CM | POA: Diagnosis not present

## 2020-11-14 DIAGNOSIS — I5020 Unspecified systolic (congestive) heart failure: Secondary | ICD-10-CM | POA: Diagnosis not present

## 2020-11-14 DIAGNOSIS — R0602 Shortness of breath: Secondary | ICD-10-CM

## 2020-11-14 DIAGNOSIS — R5381 Other malaise: Secondary | ICD-10-CM

## 2020-11-14 DIAGNOSIS — M1611 Unilateral primary osteoarthritis, right hip: Secondary | ICD-10-CM | POA: Diagnosis not present

## 2020-11-14 DIAGNOSIS — Z515 Encounter for palliative care: Secondary | ICD-10-CM | POA: Diagnosis not present

## 2020-11-14 NOTE — Progress Notes (Addendum)
[facility] Consult Note Telephone: [phone]  Fax: [phone]    Date of encounter: 11/14/20 5:25 PM PATIENT NAME: [name] [name] [address] [name] [name] [place] [address]   [phone] (home)  DOB: 1960/06/22 MRN: [id number] PRIMARY CARE PROVIDER:    Dr [name] [name] [facility]  RESPONSIBLE PARTY:    Contact Information     Name Relation Home Work [place] Sister   [phone]   [name] [name] Friend [phone]  [phone]      I met face to face with  in facility. Palliative Care was asked to follow this patient by consultation request of  Dr [name] [name] to address advance care planning and complex medical decision making. This is a follow up visit.                                  ASSESSMENT AND PLAN / RECOMMENDATIONS:  Symptom Management/Plan: ACP: full code with full scope of treatment  2.  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 Will encourage with therapy pain modality, encourage mobility as morbid obesity does aid in increase in pain  3. Shortness of breath secondary to COPD/Morbid obesity; CHF; reviewed medical goals full scope. Reviewed current regimen including inhalation therapy, diuretics, monitoring respiratory status, edema, diet, fluid, encourage mobility, continue bipap qhs for OSA/hypoventilation syndrome; continuous O2.   2. Goals of Care: Goals include to maximize quality of life and symptom management. Our advance care planning conversation included a discussion about:    The value and importance of advance care planning  Exploration of personal, cultural or spiritual beliefs that might influence medical decisions  Exploration of goals of care in the event of a sudden injury or illness  Identification and preparation of a healthcare agent  Review and updating or creation of an advance directive document.  4.  Palliative care encounter; Palliative care encounter; Palliative medicine team will continue to support patient, patient's family, and medical team. Visit consisted of counseling and education dealing with the complex and emotionally intense issues of symptom management and palliative care in the setting of serious and potentially life-threatening illness  5. f/u 1 week for ongoing monitoring chronic disease progression, ongoing discussions complex medical decision making Follow up Palliative Care Visit: Palliative care will continue to follow for complex medical decision making, advance care planning, and clarification of goals. Return 2 weeks or prn.  I spent 68 minutes providing this consultation starting at 12:15pm. More than 50% of the time in this consultation was spent in counseling and care coordination. PPS: 40%  HOSPICE ELIGIBILITY/DIAGNOSIS: TBD  Chief Complaint: Follow up palliative consult for complex medical decision making  HISTORY OF PRESENT ILLNESS:  [name] [name] is a 60 y.o. year old male with multiple medical problems including morbid obesity, hypoventilation syndrome, diastolic/systolic CHF, h/o nonischemic cardiomyopathy, h/o DVT/PE, chronic bilateral lower extremity venous stasis, h/o left ear deafness, chronic hypoxemic and hypercapnic respiratory failure, atrial fibrillation on Coumadin, Centrilobular emphysema, COPD on 3 L nasal cannula, sleep apnea, TBI, chronic pain, HTN, HLD, gout, depression, anxiety, alcohol abuse.   Hospitalized 06/08/2020 to 06/29/2020 for acute on chronic respiratory failure secondary to COPD exacerbation and CHF exacerbation.  He was also found to have right sixth rib fracture likely from fall at home.   ED 07/02/2020 for weakness  Hospitalized 08/30/2020 to 09/05/2020  for dyspnea found to have acute CHF and covid-19 infection for which he was treated with steroids, remdesivir and diuresis with improvement. He was transferred to ICU due to  [place].   Hospitalized 09/07/2020 to 09/12/2020 for shortness of breath, found to be in rapid A. fib and acute decompensated CHF requiring lasix drip.  Hospitalized 09/23/2020 to 09/27/2020 for severe shortness of breath with workup significant for acute on chronic combined systolic and diastolic CHF, COPD exacerbation, hypotension, obesity hypoventilation syndrome, persistent atrial fib with RVR and h/o VT. Mild thrombocytopenia, lactic acidosis likely due to alcohol.   Hospitalized 10/08/2020 to 10/12/2020 for shortness of breath with nausea with workup significant for COPD exacerbation, chronic hypoxemic respiratory failure.   ED 10/19/2020 right hip pain consistent with osteoarthritis of right shoulder, right hip; no fx.  Hospitalized 11/02/2020 to 11/06/2020 for acute COPD exacerbation, acute on chronic hypoxemic and hypercapric respiratory failure continues bipap sleep; adenovirus gastroenteritis with positive stool specimen.   Mr. [name] [name] was d/c to Skilled Nursing facility at [facility] for STR with plan to transition to LTC at [facility]. Mr. [name] [name] requires staff assistance for transfers though he is able to pivot to bed from w/c. Mr. [name] [name] does assist with bathing, dressing. Mr. [name] [name] participates in therapy trying to improve weakness. Mr. [name] [name] continues to feed himself with fair appetite. Staff endorses Mr. [name] [name] endorses he does not like the food. Staff endorses Mr. [name] [name] does continue to use bipap at bedtime for OSA; continuous O2 _0 /[employer]. No other changes per staff. Mr. [name] [name] is a full code. Mr. [name] [name] is sitting in the w/c in his room at [facility], appears morbidly obese, comfortable. His friend [name] [name] was present. I visited and observed Mr. [name] [name], we talked about purpose of PC visit as Mr. [name] [name] was a home PC patient. Mr. [name] [name] in agreement. We talked about how he has been feeling. We talked about recent hospitalization briefly as Mr. [name] [name] was ready to get a sheet on his  bed so he can take a nap. Mr. [name] [name] endorses they brought him a new bipap mask as he has not been sleeping well due to the mask. We talked about PMH , reviewed hospitalizations, as Mr. Clayson is new to this provider. We talked about symptoms of pain current regimen; shortness of breath. We talked about Mr. Liz appetite. Mr. Devera endorses his appetite has been declined as he does not like the food choices. Mr. Minton endorses he has spoken with the dietitian already. We talked about offering a salad or sandwich if the meal he does not prefer. Mr. Frigon endorses he would prefer that plan. We talked about STR, therapy with transition to LTC. We talked about medical goals. We talked about role pc in poc. We talked about f/u pc visit in 1 week. Mr. Carrick talked about prior to hospitalization he was living at home. Mr. Sawa talked about his friend [name]. Therapeutic listening, emotional support provided. Questions answered.   History obtained from review of EMR, discussion with facility staff and  Mr. Cassatt.  I reviewed available labs, medications, imaging, studies and related documents from the EMR.  Records reviewed and summarized above.   ROS Full 14 system review of systems performed and negative with exception of: as per HPI.   Physical Exam: Constitutional: NAD General: obese, debilitated male, chronically ill EYES: lids intact ENMT: oral mucous membranes moist CV: S1S2, RRR Pulmonary: decrease bases, no increased work of breathing, no cough, O2  Abdomen: obese, normo-active BS + 4  quadrants, soft and non tender MSK: w/c dependent, pivot Skin: warm and dry, no rashes or wounds on visible skin Neuro:  + generalized weakness,  no cognitive impairment Psych: non-anxious affect, A and O x 3  Questions and concerns were addressed.  Provided general support and encouragement, no other unmet needs identified   Thank you for the opportunity to participate in the care of Mr.  Franzoni.  The palliative care team will continue to follow. Please call our office at [phone] if we can be of additional assistance.   This chart was dictated using voice recognition software.  Despite best efforts to proofread,  errors can occur which can change the documentation meaning.   Cali Hope Ihor Gully, NP

## 2020-11-17 ENCOUNTER — Non-Acute Institutional Stay: Payer: Medicaid Other | Admitting: Nurse Practitioner

## 2020-11-17 ENCOUNTER — Encounter: Payer: Self-pay | Admitting: Nurse Practitioner

## 2020-11-17 VITALS — BP 157/99 | HR 88 | Temp 98.9°F | Resp 20 | Wt 359.4 lb

## 2020-11-17 DIAGNOSIS — G8929 Other chronic pain: Secondary | ICD-10-CM | POA: Diagnosis not present

## 2020-11-17 DIAGNOSIS — R0602 Shortness of breath: Secondary | ICD-10-CM | POA: Diagnosis not present

## 2020-11-17 DIAGNOSIS — Z515 Encounter for palliative care: Secondary | ICD-10-CM

## 2020-11-17 DIAGNOSIS — R5381 Other malaise: Secondary | ICD-10-CM

## 2020-11-17 NOTE — Progress Notes (Signed)
[facility] Consult Note Telephone: [phone]  Fax: [phone]    Date of encounter: 11/17/20 10:42 PM PATIENT NAME: [name] [name] [address] [place] [address]   [phone] (home)  DOB: 03/17/1960 MRN: [id number] PRIMARY CARE PROVIDER:   Dr [name], Devonne Doughty, DO,  [place] [address] [phone] RESPONSIBLE PARTY:    Contact Information     Name Relation Home Work [place] Sister   [phone]   [place] Amy Friend [phone]  [phone]      I met face to face with patient in facility. Palliative Care was asked to follow this patient by consultation request of  Dr [name] [name] to address advance care planning and complex medical decision making. This is a follow up visit.                                   ASSESSMENT AND PLAN / RECOMMENDATIONS: Symptom Management/Plan: ACP: full code with full scope of treatment   2.  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 Will encourage with therapy pain modality, encourage mobility as morbid obesity does aid in increase in pain   3. Shortness of breath secondary to COPD/Morbid obesity; CHF; reviewed medical goals full scope. Reviewed current regimen including inhalation therapy, diuretics, monitoring respiratory status, edema, diet, fluid, encourage mobility, continue bipap qhs for OSA/hypoventilation syndrome; continuous O2.    2. Goals of Care: Goals include to maximize quality of life and symptom management. Our advance care planning conversation included a discussion about:    The value and importance of advance care planning  Exploration of personal, cultural or spiritual beliefs that might influence medical decisions  Exploration of goals of care in the event of a sudden injury or illness  Identification and preparation of a healthcare  agent  Review and updating or creation of an advance directive document.   4. Palliative care encounter; Palliative care encounter; Palliative medicine team will continue to support patient, patient's family, and medical team. Visit consisted of counseling and education dealing with the complex and emotionally intense issues of symptom management and palliative care in the setting of serious and potentially life-threatening illness   5. f/u 2 week for ongoing monitoring chronic disease progression, ongoing discussions complex medical decision making  Follow up Palliative Care Visit: Palliative care will continue to follow for complex medical decision making, advance care planning, and clarification of goals. Return 2 weeks or prn.  I spent 36 minutes providing this consultation. More than 50% of the time in this consultation was spent in counseling and care coordination. PPS: 40%  Chief Complaint: Follow up  palliative consult for complex medical decision making  HISTORY OF PRESENT ILLNESS:  [name] [name] is a 60 y.o. year old male  with multiple medical problems including morbid obesity, hypoventilation syndrome, diastolic/systolic CHF, h/o nonischemic cardiomyopathy, h/o DVT/PE, chronic bilateral lower extremity venous stasis, h/o left ear deafness, chronic hypoxemic and hypercapnic respiratory failure, atrial fibrillation on Coumadin, Centrilobular emphysema, COPD on 3 L nasal cannula, sleep apnea, TBI, chronic pain, HTN, HLD, gout, depression, anxiety, alcohol abuse.    Hospitalized 06/08/2020 to 06/29/2020 for acute on chronic respiratory failure secondary to COPD exacerbation and CHF exacerbation.  He was also found to have right sixth rib fracture likely from fall at home.  ED 07/02/2020 for weakness   Hospitalized 08/30/2020 to 09/05/2020 for dyspnea found to have acute CHF and covid-19 infection for which he was treated with steroids, remdesivir and diuresis with improvement. He  was transferred to ICU due to [name] [name].    Hospitalized 09/07/2020 to 09/12/2020 for shortness of breath, found to be in rapid A. fib and acute decompensated CHF requiring lasix drip.   Hospitalized 09/23/2020 to 09/27/2020 for severe shortness of breath with workup significant for acute on chronic combined systolic and diastolic CHF, COPD exacerbation, hypotension, obesity hypoventilation syndrome, persistent atrial fib with RVR and h/o VT. Mild thrombocytopenia, lactic acidosis likely due to alcohol.    Hospitalized 10/08/2020 to 10/12/2020 for shortness of breath with nausea with workup significant for COPD exacerbation, chronic hypoxemic respiratory failure.    ED 10/19/2020 right hip pain consistent with osteoarthritis of right shoulder, right hip; no fx.   Hospitalized 11/02/2020 to 11/06/2020 for acute COPD exacerbation, acute on chronic hypoxemic and hypercapric respiratory failure continues bipap sleep; adenovirus gastroenteritis with positive stool specimen.    Mr. [name] [name] was d/c to Skilled Nursing facility at [facility] for STR with plan to transition to LTC at [facility]. Mr. [name] [name] requires staff assistance for transfers though he is able to pivot to bed from w/c. Mr. [name] [name] does assist with bathing, dressing. Mr. [name] [name] participates in therapy trying to improve weakness. Mr. [name] [name] continues to feed himself with fair appetite. Staff endorses Mr. [name] [name] endorses he does not like the food. Staff endorses Mr. [name] [name] does continue to use bipap at bedtime for OSA; continuous O2 _0 /[name] [name]. No other changes per staff. Mr. [name] [name] is a full code. At present [name] is lying in bed, sleeping. Mr. [name] [name] awakes to verbal cues. [name] makes eye contact, interactive. Mr. [name] [name] endorses he continues to have some difficulty sleeping. Mr. [name] [name] endorses the roommate continue to keep the TV through the night. Mr. [name] [name] asked for ear plugs. I relayed to staff. We talked about sleep pattern,  sleep hygiene. We talked about symptoms including pain/shortness of breath. Appetite continues to be declined as Mr. [name] [name] endorses he has not received a sandwich or salad when he does not like being served. Updated staff and will f/u on the request for a second meal. We talked about routine at SNF, transition to LTC, challenges with coping. Mr. Wainer cooperative with assessment. Emotional support provided. Medical goals reviewed. Encouraged Mr. Irani continue to stay with routine as able, oob, encourage mobility. We talked about f/u pc visit, Mr. Basso in agreement. I updated staff, no other changes to goc.   History obtained from review of EMR, discussion with facility staff and Mr. Applegate.  I reviewed available labs, medications, imaging, studies and related documents from the EMR.  Records reviewed and summarized above.   ROS Full 10 system review of systems performed and negative with exception of: as per HPI.  Physical Exam: Constitutional: NAD General: obese, debilitated, chronically ill, pleasant male EYES: lids intact ENMT: oral mucous membranes moist CV: S1S2, RRR, BLE edema Pulmonary: LCTA, no increased work of breathing, no cough, O2 Abdomen: normo-active BS + 4 quadrants, soft and non tender MSK: s/c dependent Skin: warm and dry Neuro:  + generalized weakness,  no cognitive impairment Psych: non-anxious affect, A and O x 3  Questions and concerns were addressed. Provided general support and encouragement, no other unmet needs identified   Thank you for the opportunity to participate in the care of Mr. Franchini.  The palliative  care team will continue to follow. Please call our office at [phone] if we can be of additional assistance.  This chart was dictated using voice recognition software.  Despite best efforts to proofread,  errors can occur which can change the documentation meaning.    Ihor Gully, NP

## 2020-11-18 ENCOUNTER — Other Ambulatory Visit: Payer: Self-pay

## 2020-12-04 DIAGNOSIS — G4733 Obstructive sleep apnea (adult) (pediatric): Secondary | ICD-10-CM | POA: Diagnosis not present

## 2020-12-05 ENCOUNTER — Non-Acute Institutional Stay: Payer: Medicaid Other | Admitting: Nurse Practitioner

## 2020-12-05 ENCOUNTER — Encounter: Payer: Self-pay | Admitting: Nurse Practitioner

## 2020-12-05 DIAGNOSIS — T7840XS Allergy, unspecified, sequela: Secondary | ICD-10-CM

## 2020-12-05 DIAGNOSIS — Z515 Encounter for palliative care: Secondary | ICD-10-CM | POA: Diagnosis not present

## 2020-12-05 DIAGNOSIS — G8929 Other chronic pain: Secondary | ICD-10-CM

## 2020-12-05 DIAGNOSIS — M1611 Unilateral primary osteoarthritis, right hip: Secondary | ICD-10-CM

## 2020-12-05 NOTE — Progress Notes (Signed)
[facility] Consult Note Telephone: [phone]  Fax: [phone]    Date of encounter: 12/05/20 9:51 PM PATIENT NAME: [name] [name] [address] [name] [name] [place] [address]   [phone] (home)  DOB: 12/13/1960 MRN: [id number] PRIMARY CARE PROVIDER:   [place], DO,  [address] [place] [address] [phone]  RESPONSIBLE PARTY:    Contact Information     Name Relation Home Work [name] [name]   [phone]   [name] [name] [phone]  [phone]      I met face to face with patient in facility. Palliative Care was asked to follow this patient by consultation request of  [name] [name] [name] to address advance care planning and complex medical decision making. This is a follow up visit.                                  ASSESSMENT AND PLAN / RECOMMENDATIONS: Symptom Management/Plan: ACP: full code with full scope of treatment   2.  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 Will encourage with therapy pain modality, encourage mobility as morbid obesity does aid in increase in pain. Discussed at length about right hip and right shoulder pain. [name] [name] endorses he is followed by [name] [name], Orthopedic for these chronic conditions. [name] [name] is requesting f/u visit to be scheduled. Order written, updated staff   3. Shortness of breath secondary to COPD/Morbid obesity; CHF; reviewed medical goals full scope. Reviewed current regimen including inhalation therapy, diuretics, monitoring respiratory status, edema, diet, fluid, encourage mobility, continue bipap qhs for OSA/hypoventilation syndrome; continuous O2.    2. Goals of Care: Goals include to maximize quality of life and symptom management. Our advance care planning conversation included a discussion about:    The value and importance of advance care  planning  Exploration of personal, cultural or spiritual beliefs that might influence medical decisions  Exploration of goals of care in the event of a sudden injury or illness  Identification and preparation of a healthcare agent  Review and updating or creation of an advance directive document.   4. Palliative care encounter; Palliative care encounter; Palliative medicine team will continue to support patient, patient's family, and medical team. Visit consisted of counseling and education dealing with the complex and emotionally intense issues of symptom management and palliative care in the setting of serious and potentially life-threatening illness  5. Allergies, discussed at length, ordered flonase, updated staff. Educated [name] [name]   6. f/u 2 week for ongoing monitoring chronic disease progression, ongoing discussions complex medical decision making   Follow up Palliative Care Visit: Palliative care will continue to follow for complex medical decision making, advance care planning, and clarification of goals. Return 2 weeks or prn.  I spent 67 minutes providing this consultation. More than 50% of the time in this consultation was spent in counseling and care coordination.  PPS: 40%  Chief Complaint: Follow up palliative consult for complex medical decision making  HISTORY OF PRESENT ILLNESS:  [name] [name] is a 60 y.o. year old male  with multiple medical problems including morbid obesity, hypoventilation syndrome, diastolic/systolic CHF, h/o nonischemic cardiomyopathy, h/o DVT/PE, chronic bilateral lower extremity venous stasis, h/o left ear deafness, chronic hypoxemic and hypercapnic respiratory failure, atrial fibrillation on Coumadin, Centrilobular emphysema, COPD on 3 L  nasal cannula, sleep apnea, TBI, chronic pain, HTN, HLD, gout, depression, anxiety, alcohol abuse.    Hospitalized 06/08/2020 to 06/29/2020 for acute on chronic respiratory failure secondary to COPD  exacerbation and CHF exacerbation.  He was also found to have right sixth rib fracture likely from fall at home.    ED 07/02/2020 for weakness   Hospitalized 08/30/2020 to 09/05/2020 for dyspnea found to have acute CHF and covid-19 infection for which he was treated with steroids, remdesivir and diuresis with improvement. He was transferred to ICU due to [place].    Hospitalized 09/07/2020 to 09/12/2020 for shortness of breath, found to be in rapid A. fib and acute decompensated CHF requiring lasix drip.   Hospitalized 09/23/2020 to 09/27/2020 for severe shortness of breath with workup significant for acute on chronic combined systolic and diastolic CHF, COPD exacerbation, hypotension, obesity hypoventilation syndrome, persistent atrial fib with RVR and h/o VT. Mild thrombocytopenia, lactic acidosis likely due to alcohol.    Hospitalized 10/08/2020 to 10/12/2020 for shortness of breath with nausea with workup significant for COPD exacerbation, chronic hypoxemic respiratory failure.    ED 10/19/2020 right hip pain consistent with osteoarthritis of right shoulder, right hip; no fx.   Hospitalized 11/02/2020 to 11/06/2020 for acute COPD exacerbation, acute on chronic hypoxemic and hypercapric respiratory failure continues bipap sleep; adenovirus gastroenteritis with positive stool specimen.    [name] [name] was d/c to Skilled Nursing facility at [facility] for STR with plan to transition to LTC at [facility]. [name] [name] requires staff assistance for transfers though he is able to pivot to bed from w/c. [name] [name] does assist with bathing, dressing. [name] [name] participates in therapy trying to improve weakness. [name] [name] continues to feed himself with fair appetite. Staff endorses [name] [name] endorses he does not like the food. Staff endorses [name] [name] does continue to use bipap at bedtime for OSA; continuous O2 @4L /[place]. No other changes per staff. [name] [name] is a full code. At present Mr [name] is lying  in bed, sleeping.  [name]   History obtained from review of EMR, discussion with facility staff and [name] [name].  I reviewed available labs, medications, imaging, studies and related documents from the EMR.  Records reviewed and summarized above.   ROS Full 10 system review of systems performed and negative with exception of: as per HPI.   Physical Exam: Constitutional: NAD General: obese, debilitated, chronically ill male EYES: ids intact ENMT: oral mucous membranes moist CV: S1S2, RRR, +LE edema Pulmonary: LCTA, no increased work of breathing, no cough Abdomen: soft and non tender MSK: w/c dependent Skin: warm and dry Neuro:  + generalized weakness,  no cognitive impairment Psych: non-anxious affect, A and O x 3  Questions and concerns were addressed. Provided general support and encouragement, no other unmet needs identified   Thank you for the opportunity to participate in the care of Mr. Hanshaw.  The palliative care team will continue to follow. Please call our office at [phone] if we can be of additional assistance.   This chart was dictated using voice recognition software.  Despite best efforts to proofread,  errors can occur which can change the documentation meaning.   Grace Haggart Ihor Gully, NP

## 2020-12-08 ENCOUNTER — Other Ambulatory Visit: Payer: Self-pay

## 2020-12-12 ENCOUNTER — Emergency Department: Payer: Medicaid Other

## 2020-12-12 ENCOUNTER — Inpatient Hospital Stay
Admission: EM | Admit: 2020-12-12 | Discharge: 2020-12-16 | DRG: 291 | Disposition: A | Discharge status: Skilled Nursing Facility | Payer: Medicaid Other | Attending: Internal Medicine | Admitting: Internal Medicine

## 2020-12-12 ENCOUNTER — Other Ambulatory Visit: Payer: Self-pay

## 2020-12-12 DIAGNOSIS — J9621 Acute and chronic respiratory failure with hypoxia: Secondary | ICD-10-CM | POA: Diagnosis not present

## 2020-12-12 DIAGNOSIS — G4733 Obstructive sleep apnea (adult) (pediatric): Secondary | ICD-10-CM | POA: Diagnosis present

## 2020-12-12 DIAGNOSIS — F419 Anxiety disorder, unspecified: Secondary | ICD-10-CM | POA: Diagnosis present

## 2020-12-12 DIAGNOSIS — G4730 Sleep apnea, unspecified: Secondary | ICD-10-CM | POA: Diagnosis present

## 2020-12-12 DIAGNOSIS — I5021 Acute systolic (congestive) heart failure: Secondary | ICD-10-CM | POA: Diagnosis not present

## 2020-12-12 DIAGNOSIS — I959 Hypotension, unspecified: Secondary | ICD-10-CM | POA: Diagnosis not present

## 2020-12-12 DIAGNOSIS — I4819 Other persistent atrial fibrillation: Secondary | ICD-10-CM | POA: Diagnosis present

## 2020-12-12 DIAGNOSIS — J9620 Acute and chronic respiratory failure, unspecified whether with hypoxia or hypercapnia: Secondary | ICD-10-CM | POA: Insufficient documentation

## 2020-12-12 DIAGNOSIS — J432 Centrilobular emphysema: Secondary | ICD-10-CM | POA: Diagnosis present

## 2020-12-12 DIAGNOSIS — J9601 Acute respiratory failure with hypoxia: Secondary | ICD-10-CM | POA: Diagnosis present

## 2020-12-12 DIAGNOSIS — I509 Heart failure, unspecified: Secondary | ICD-10-CM

## 2020-12-12 DIAGNOSIS — R0902 Hypoxemia: Secondary | ICD-10-CM | POA: Diagnosis not present

## 2020-12-12 DIAGNOSIS — E662 Morbid (severe) obesity with alveolar hypoventilation: Secondary | ICD-10-CM | POA: Diagnosis present

## 2020-12-12 DIAGNOSIS — Z6841 Body Mass Index (BMI) 40.0 and over, adult: Secondary | ICD-10-CM

## 2020-12-12 DIAGNOSIS — Z20822 Contact with and (suspected) exposure to covid-19: Secondary | ICD-10-CM | POA: Diagnosis present

## 2020-12-12 DIAGNOSIS — E119 Type 2 diabetes mellitus without complications: Secondary | ICD-10-CM

## 2020-12-12 DIAGNOSIS — Z86711 Personal history of pulmonary embolism: Secondary | ICD-10-CM

## 2020-12-12 DIAGNOSIS — Z79899 Other long term (current) drug therapy: Secondary | ICD-10-CM

## 2020-12-12 DIAGNOSIS — R404 Transient alteration of awareness: Secondary | ICD-10-CM | POA: Diagnosis not present

## 2020-12-12 DIAGNOSIS — I2510 Atherosclerotic heart disease of native coronary artery without angina pectoris: Secondary | ICD-10-CM | POA: Diagnosis present

## 2020-12-12 DIAGNOSIS — I2699 Other pulmonary embolism without acute cor pulmonale: Secondary | ICD-10-CM | POA: Diagnosis present

## 2020-12-12 DIAGNOSIS — Z8249 Family history of ischemic heart disease and other diseases of the circulatory system: Secondary | ICD-10-CM

## 2020-12-12 DIAGNOSIS — R0602 Shortness of breath: Secondary | ICD-10-CM

## 2020-12-12 DIAGNOSIS — I480 Paroxysmal atrial fibrillation: Secondary | ICD-10-CM | POA: Diagnosis present

## 2020-12-12 DIAGNOSIS — I428 Other cardiomyopathies: Secondary | ICD-10-CM | POA: Diagnosis present

## 2020-12-12 DIAGNOSIS — M109 Gout, unspecified: Secondary | ICD-10-CM | POA: Diagnosis present

## 2020-12-12 DIAGNOSIS — I4891 Unspecified atrial fibrillation: Secondary | ICD-10-CM | POA: Diagnosis present

## 2020-12-12 DIAGNOSIS — F32A Depression, unspecified: Secondary | ICD-10-CM | POA: Diagnosis present

## 2020-12-12 DIAGNOSIS — Z7951 Long term (current) use of inhaled steroids: Secondary | ICD-10-CM

## 2020-12-12 DIAGNOSIS — I517 Cardiomegaly: Secondary | ICD-10-CM | POA: Diagnosis not present

## 2020-12-12 DIAGNOSIS — F1010 Alcohol abuse, uncomplicated: Secondary | ICD-10-CM | POA: Diagnosis present

## 2020-12-12 DIAGNOSIS — R0689 Other abnormalities of breathing: Secondary | ICD-10-CM | POA: Diagnosis not present

## 2020-12-12 DIAGNOSIS — E039 Hypothyroidism, unspecified: Secondary | ICD-10-CM | POA: Diagnosis present

## 2020-12-12 DIAGNOSIS — R4020 Unspecified coma: Secondary | ICD-10-CM | POA: Diagnosis not present

## 2020-12-12 DIAGNOSIS — G894 Chronic pain syndrome: Secondary | ICD-10-CM | POA: Diagnosis present

## 2020-12-12 DIAGNOSIS — Z86718 Personal history of other venous thrombosis and embolism: Secondary | ICD-10-CM

## 2020-12-12 DIAGNOSIS — G9341 Metabolic encephalopathy: Secondary | ICD-10-CM | POA: Diagnosis present

## 2020-12-12 DIAGNOSIS — E1169 Type 2 diabetes mellitus with other specified complication: Secondary | ICD-10-CM | POA: Diagnosis present

## 2020-12-12 DIAGNOSIS — Z66 Do not resuscitate: Secondary | ICD-10-CM | POA: Diagnosis present

## 2020-12-12 DIAGNOSIS — R4182 Altered mental status, unspecified: Secondary | ICD-10-CM | POA: Diagnosis not present

## 2020-12-12 DIAGNOSIS — I110 Hypertensive heart disease with heart failure: Principal | ICD-10-CM | POA: Diagnosis present

## 2020-12-12 DIAGNOSIS — Z7982 Long term (current) use of aspirin: Secondary | ICD-10-CM

## 2020-12-12 DIAGNOSIS — Z87440 Personal history of urinary (tract) infections: Secondary | ICD-10-CM

## 2020-12-12 DIAGNOSIS — Z91199 Patient's noncompliance with other medical treatment and regimen due to unspecified reason: Secondary | ICD-10-CM

## 2020-12-12 DIAGNOSIS — J9622 Acute and chronic respiratory failure with hypercapnia: Secondary | ICD-10-CM

## 2020-12-12 DIAGNOSIS — E785 Hyperlipidemia, unspecified: Secondary | ICD-10-CM | POA: Diagnosis present

## 2020-12-12 DIAGNOSIS — D509 Iron deficiency anemia, unspecified: Secondary | ICD-10-CM | POA: Diagnosis present

## 2020-12-12 DIAGNOSIS — I10 Essential (primary) hypertension: Secondary | ICD-10-CM | POA: Diagnosis present

## 2020-12-12 DIAGNOSIS — I5043 Acute on chronic combined systolic (congestive) and diastolic (congestive) heart failure: Secondary | ICD-10-CM | POA: Diagnosis not present

## 2020-12-12 DIAGNOSIS — Z7901 Long term (current) use of anticoagulants: Secondary | ICD-10-CM | POA: Diagnosis not present

## 2020-12-12 DIAGNOSIS — Z79890 Hormone replacement therapy (postmenopausal): Secondary | ICD-10-CM

## 2020-12-12 DIAGNOSIS — J9811 Atelectasis: Secondary | ICD-10-CM | POA: Diagnosis not present

## 2020-12-12 DIAGNOSIS — R400 Somnolence: Secondary | ICD-10-CM | POA: Diagnosis not present

## 2020-12-12 DIAGNOSIS — M25511 Pain in right shoulder: Secondary | ICD-10-CM | POA: Diagnosis present

## 2020-12-12 LAB — CBC WITH DIFFERENTIAL/PLATELET
Abs Immature Granulocytes: 0.02 10*3/uL (ref 0.00–0.07)
Basophils Absolute: 0 10*3/uL (ref 0.0–0.1)
Basophils Relative: 1 %
Eosinophils Absolute: 0.3 10*3/uL (ref 0.0–0.5)
Eosinophils Relative: 4 %
HCT: 33.3 % — ABNORMAL LOW (ref 39.0–52.0)
Hemoglobin: 10.3 g/dL — ABNORMAL LOW (ref 13.0–17.0)
Immature Granulocytes: 0 %
Lymphocytes Relative: 18 %
Lymphs Abs: 1.1 10*3/uL (ref 0.7–4.0)
MCH: 29.4 pg (ref 26.0–34.0)
MCHC: 30.9 g/dL (ref 30.0–36.0)
MCV: 95.1 fL (ref 80.0–100.0)
Monocytes Absolute: 0.7 10*3/uL (ref 0.1–1.0)
Monocytes Relative: 11 %
Neutro Abs: 3.9 10*3/uL (ref 1.7–7.7)
Neutrophils Relative %: 66 %
Platelets: 212 10*3/uL (ref 150–400)
RBC: 3.5 MIL/uL — ABNORMAL LOW (ref 4.22–5.81)
RDW: 16 % — ABNORMAL HIGH (ref 11.5–15.5)
WBC: 5.9 10*3/uL (ref 4.0–10.5)
nRBC: 0 % (ref 0.0–0.2)

## 2020-12-12 LAB — PROTIME-INR
INR: 1.2 (ref 0.8–1.2)
Prothrombin Time: 15.7 seconds — ABNORMAL HIGH (ref 11.4–15.2)

## 2020-12-12 LAB — COMPREHENSIVE METABOLIC PANEL
ALT: 9 U/L (ref 0–44)
AST: 25 U/L (ref 15–41)
Albumin: 3.2 g/dL — ABNORMAL LOW (ref 3.5–5.0)
Alkaline Phosphatase: 56 U/L (ref 38–126)
Anion gap: 6 (ref 5–15)
BUN: 28 mg/dL — ABNORMAL HIGH (ref 6–20)
CO2: 41 mmol/L — ABNORMAL HIGH (ref 22–32)
Calcium: 8.8 mg/dL — ABNORMAL LOW (ref 8.9–10.3)
Chloride: 84 mmol/L — ABNORMAL LOW (ref 98–111)
Creatinine, Ser: 1.1 mg/dL (ref 0.61–1.24)
GFR, Estimated: >60 mL/min (ref >60)
Glucose, Bld: 117 mg/dL — ABNORMAL HIGH (ref 70–99)
Potassium: 5.5 mmol/L — ABNORMAL HIGH (ref 3.5–5.1)
Sodium: 131 mmol/L — ABNORMAL LOW (ref 135–145)
Total Bilirubin: 1.2 mg/dL (ref 0.3–1.2)
Total Protein: 6.6 g/dL (ref 6.5–8.1)

## 2020-12-12 LAB — RESP PANEL BY RT-PCR (FLU A&B, COVID) ARPGX2
Influenza A by PCR: NEGATIVE
Influenza B by PCR: NEGATIVE
SARS Coronavirus 2 by RT PCR: NEGATIVE

## 2020-12-12 LAB — BRAIN NATRIURETIC PEPTIDE: B Natriuretic Peptide: 203.7 pg/mL — ABNORMAL HIGH (ref 0.0–100.0)

## 2020-12-12 LAB — MAGNESIUM: Magnesium: 1.8 mg/dL (ref 1.7–2.4)

## 2020-12-12 LAB — TROPONIN I (HIGH SENSITIVITY): Troponin I (High Sensitivity): 21 ng/L — ABNORMAL HIGH (ref <18)

## 2020-12-12 MED ORDER — HYDRALAZINE HCL 20 MG/ML IJ SOLN: 5.0000 mg | INTRAMUSCULAR | Status: DC | PRN

## 2020-12-12 MED ORDER — METHYLPREDNISOLONE SODIUM SUCC 40 MG IJ SOLR
40.0000 mg | Freq: Two times a day (BID) | INTRAMUSCULAR | Status: AC
  Administered 2020-12-13 – 2020-12-14 (×4): 40 mg via INTRAVENOUS
  Filled 2020-12-12 (×4): qty 1

## 2020-12-12 MED ORDER — FUROSEMIDE 10 MG/ML IJ SOLN
60.0000 mg | Freq: Once | INTRAMUSCULAR | Status: AC
  Administered 2020-12-12: 60 mg via INTRAVENOUS
  Filled 2020-12-12: qty 8

## 2020-12-12 MED ORDER — NICOTINE 14 MG/24HR TD PT24
14.0000 mg | MEDICATED_PATCH | Freq: Every day | TRANSDERMAL | Status: DC
  Administered 2020-12-13: 14 mg via TRANSDERMAL
  Filled 2020-12-12 (×2): qty 1

## 2020-12-12 MED ORDER — HEPARIN SODIUM (PORCINE) 5000 UNIT/ML IJ SOLN: 5000.0000 [IU] | Freq: Three times a day (TID) | INTRAMUSCULAR | Status: DC

## 2020-12-12 MED ORDER — IPRATROPIUM-ALBUTEROL 0.5-2.5 (3) MG/3ML IN SOLN
3.0000 mL | Freq: Once | RESPIRATORY_TRACT | Status: AC
  Administered 2020-12-12: 3 mL via RESPIRATORY_TRACT
  Filled 2020-12-12: qty 3

## 2020-12-12 MED ORDER — METHYLPREDNISOLONE SODIUM SUCC 125 MG IJ SOLR
125.0000 mg | Freq: Once | INTRAMUSCULAR | Status: AC
  Administered 2020-12-12: 125 mg via INTRAVENOUS
  Filled 2020-12-12: qty 2

## 2020-12-12 MED ORDER — ACETAMINOPHEN 650 MG RE SUPP: 650.0000 mg | Freq: Four times a day (QID) | RECTAL | Status: DC | PRN

## 2020-12-12 MED ORDER — FUROSEMIDE 10 MG/ML IJ SOLN
40.0000 mg | Freq: Two times a day (BID) | INTRAMUSCULAR | Status: DC
  Administered 2020-12-13 – 2020-12-15 (×6): 40 mg via INTRAVENOUS
  Filled 2020-12-12 (×6): qty 4

## 2020-12-12 MED ORDER — FUROSEMIDE 10 MG/ML IJ SOLN: 40.0000 mg | Freq: Two times a day (BID) | INTRAMUSCULAR | Status: DC

## 2020-12-12 MED ORDER — ACETAMINOPHEN 325 MG PO TABS
650.0000 mg | ORAL_TABLET | Freq: Four times a day (QID) | ORAL | Status: DC | PRN
  Administered 2020-12-16: 08:00:00 650 mg via ORAL
  Filled 2020-12-12: qty 2

## 2020-12-12 MED ORDER — DOCUSATE SODIUM 100 MG PO CAPS
100.0000 mg | ORAL_CAPSULE | Freq: Two times a day (BID) | ORAL | Status: DC
  Administered 2020-12-13 – 2020-12-16 (×7): 100 mg via ORAL
  Filled 2020-12-12 (×7): qty 1

## 2020-12-12 MED ORDER — SODIUM CHLORIDE 0.9% FLUSH
3.0000 mL | Freq: Two times a day (BID) | INTRAVENOUS | Status: DC
  Administered 2020-12-12 – 2020-12-16 (×6): 3 mL via INTRAVENOUS

## 2020-12-12 NOTE — ED Triage Notes (Signed)
Pt BIB EMS from [facility] for [facility]. On EMS arrival, pt was 85% on his home O2 at 4L/min.

## 2020-12-12 NOTE — ED Notes (Signed)
This RN spoke with lab. Because pt is a difficult stick, Hgb A1C will be added to AM labs.
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
## 2020-12-12 NOTE — ED Notes (Signed)
This RN called RT for blood gas & BiPAP. RT to room now.

## 2020-12-12 NOTE — ED Notes (Signed)
IV team at BS 

## 2020-12-12 NOTE — ED Notes (Signed)
Pt removed BiPAP mask. This RN put mask back on pt & explained to pt that he needs to keep this on at this time.

## 2020-12-12 NOTE — ED Notes (Signed)
This RN unable to draw blood work from PIV.

## 2020-12-12 NOTE — H&P (Signed)
History and Physical    [name] GLO:[id number] DOB: 15-Dec-1960 DOA: 12/12/2020  PCP: [name], DO    Patient coming from:  [facility].    Chief Complaint:  Shortness of breath   HPI: [name] [name] is a 60 y.o. male seen in ed with complaints of brought to the emergency room for complaints of shortness of breath and confusion.  Per ED provider patient is belligerent and argumentative , he does not cooperate with staff.  Exam and communicating with patient is difficult and challenging.  Patient is on 4 L chronically at home but is known to be noncompliant.  Head CT noncontrast was said to be negative.HPI/ ROS is limited due to pt being uncooperative,. He does not wake up for me and does not open eyes or follow command.  Pt has past medical history of hypomagnesemia, congestive heart failure, atrial fibrillation, UTI, generalized weakness, alcohol abuse, alcohol withdrawal, iron deficiency anemia, chronic anticoagulation, diabetes mellitus type 2, hypertension, gout, sleep apnea.  ED Course:  Vitals:   12/12/20 2000 12/12/20 2030 12/12/20 2230 12/12/20 2300  BP: (!) 103/51 108/62 (!) 102/42 (!) 136/54  Pulse: (!) 58 68 (!) 45 (!) 55  Resp: 15 18 17  (!) 23  Temp:      TempSrc:      SpO2: 100% 100% 95% 97%  Weight:      Height:      In the emergency room patient is afebrile oxygenating on 100% on BiPAP, heart rate in the 60s, Initial ABG shows pH of 7.5 PCO2 of 50 and PO2 of 160 bicarb of 39.  Chest x-ray shows mild bibasilar subsegmental atelectasis and a small left pleural effusion, head CT noncontrast obtained for patient's confusion shows no acute intracranial abnormality and a left mastoid effusion. Initial blood work shows sodium of 131, potassium of 5.5 sample was hemolyzed and will repeated, glucose of 117, normal creatinine of 1.10, magnesium 1.8, albumin 3.2, normal AST ALT, BNP of 203.7, troponin of 21, CBC shows a normal white  count of 5.9 and a hemoglobin of 10.3, hematocrit of 33.3 and RDW of 16 platelets of 212, respiratory panel negative for flu and COVID. Again in the emergency room patient received 60 of Lasix, DuoNeb's and Solu-Medrol 125 mg loading.  Patient started on BiPAP therapy.  Review of Systems:  Review of Systems  Unable to perform ROS: Patient unresponsive (Patient uncooperative)    Past Medical History:  Diagnosis Date   Allergy    Anxiety    Arthritis    Asthma    Brain damage    Chronic pain of both knees 07/13/2018   Clotting disorder (HCC)    COPD (chronic obstructive pulmonary disease) (HCC)    Depression    GERD (gastroesophageal reflux disease)    HFrEF (heart failure with reduced ejection fraction) (HCC)    a. 03/2018 Echo: EF 25-30%, diff HK. Mod LAE.   History of DVT (deep vein thrombosis)    History of pulmonary embolism    a. Chronic coumadin.   Hypertension    MI (myocardial infarction) (HCC)    Morbid obesity (HCC)    Neck pain 07/13/2018   NICM (nonischemic cardiomyopathy) (HCC)    a. s/p Cath x 3 - reportedly nl cors. Last cath 2019 in [place]; b. a. 03/2018 Echo: EF 25-30%, diff HK.   Persistent atrial fibrillation (HCC)    a. 03/2018 s/p DCCV; b. CHA2DS2VASc = 1-->Xarelto (later changed to warfarin); c. 05/2018 recurrent  afib-->Amio initiated.   Sleep apnea    Sleep apnea     Past Surgical History:  Procedure Laterality Date   CARDIOVERSION N/A 03/24/2018   Procedure: CARDIOVERSION;  Surgeon: Antonieta Iba, MD;  Location: [facility] ORS;  Service: Cardiovascular;  Laterality: N/A;   CARDIOVERSION N/A 08/08/2018   Procedure: CARDIOVERSION;  Surgeon: [name], MD;  Location: [facility] ORS;  Service: Cardiovascular;  Laterality: N/A;   hearnia repair     X 3- total of two surgeries   HERNIA REPAIR     LEG SURGERY       reports that he has never smoked. He has never used smokeless tobacco. He reports current alcohol use. He reports that he does not use drugs.  No Known  Allergies  Family History  Problem Relation Age of Onset   Heart failure Mother    Lung cancer Mother    Lung cancer Father    Heart attack Maternal Grandmother    Heart attack Maternal Grandfather     Prior to Admission medications   Medication Sig Start Date End Date Taking? Authorizing Provider  allopurinol (ZYLOPRIM) 100 MG tablet Take 1 tablet (100 mg total) by mouth daily. 09/12/20 12/12/20 Yes Dahal, Melina Schools, MD  ALPRAZolam Prudy Feeler) 0.5 MG tablet Take 0.5 mg by mouth every 6 (six) hours as needed for anxiety.   Yes [provider]  Amino Acids-Protein Hydrolys (FEEDING SUPPLEMENT, PRO-STAT SUGAR FREE 64,) LIQD Take 30 mLs by mouth 2 (two) times daily.   Yes [provider]  amiodarone (PACERONE) 200 MG tablet Take 200 mg by mouth 2 (two) times daily. 12/01/20  Yes [provider]  aspirin EC 81 MG tablet Take 81 mg by mouth daily. Swallow whole.   Yes [provider]  atorvastatin (LIPITOR) 40 MG tablet Take 1 tablet (40 mg total) by mouth daily. 09/12/20 12/12/20 Yes Dahal, Melina Schools, MD  carvedilol (COREG) 6.25 MG tablet Take 1 tablet (6.25 mg total) by mouth 2 (two) times daily with a meal. 09/12/20 12/12/20 Yes Dahal, Melina Schools, MD  cetirizine (ZYRTEC) 10 MG tablet Take 10 mg by mouth daily.   Yes [provider]  fluticasone (FLONASE) 50 MCG/ACT nasal spray Place 1 spray into both nostrils daily.   Yes [provider]  Fluticasone-Umeclidin-Vilant (TRELEGY ELLIPTA) 100-62.5-25 MCG/INH AEPB Inhale 1 Inhaler into the lungs daily. 09/27/20  Yes Marrion Coy, MD  folic acid (FOLVITE) 1 MG tablet Take 1 tablet (1 mg total) by mouth daily. 11/07/20  Yes Esaw Grandchild A, DO  gabapentin (NEURONTIN) 300 MG capsule Take 300 mg by mouth 2 (two) times daily.   Yes [provider]  levothyroxine (SYNTHROID) 75 MCG tablet Take 1 tablet (75 mcg total) by mouth daily before breakfast. 09/12/20 12/12/20 Yes Dahal, Melina Schools, MD  lisinopril  (ZESTRIL) 2.5 MG tablet Take 1 tablet (2.5 mg total) by mouth daily. 11/06/20  Yes Pennie Banter, DO  Multiple Vitamin (MULTIVITAMIN WITH MINERALS) TABS tablet Take 1 tablet by mouth daily.   Yes [provider]  nystatin (MYCOSTATIN/NYSTOP) powder Apply 1 application topically 2 (two) times daily.   Yes [provider]  oxyCODONE-acetaminophen (PERCOCET) 10-325 MG tablet Take 1 tablet by mouth every 6 (six) hours as needed for pain.   Yes [provider]  spironolactone (ALDACTONE) 25 MG tablet Take 0.5 tablets (12.5 mg total) by mouth daily. 09/13/20 12/12/20 Yes Dahal, Melina Schools, MD  torsemide (DEMADEX) 20 MG tablet Take 2 tablets (40 mg total) by mouth daily. 09/27/20 12/26/20  Yes Marrion Coy, MD  traZODone (DESYREL) 150 MG tablet Take 1 tablet (150 mg total) by mouth at bedtime. 11/06/20  Yes Esaw Grandchild A, DO  warfarin (COUMADIN) 6 MG tablet Take 6 mg by mouth daily.   Yes [provider]  acetaminophen (TYLENOL) 325 MG tablet Take 2 tablets (650 mg total) by mouth every 6 (six) hours as needed for mild pain, fever or headache. 11/06/20   Esaw Grandchild A, DO  albuterol (VENTOLIN HFA) 108 (90 Base) MCG/ACT inhaler Inhale 2 puffs into the lungs every 6 (six) hours as needed for wheezing or shortness of breath. 11/06/20   Pennie Banter, DO  calamine lotion Apply topically 2 (two) times daily. 11/06/20   Esaw Grandchild A, DO  colchicine 0.6 MG tablet Take 1 tablet (0.6 mg total) by mouth daily. 09/12/20 12/11/20  Dahal, Melina Schools, MD  ipratropium-albuterol (DUONEB) 0.5-2.5 (3) MG/3ML SOLN Take 3 mLs by nebulization every 6 (six) hours as needed. 11/06/20   Pennie Banter, DO  loperamide (IMODIUM) 2 MG capsule Take 1 capsule (2 mg total) by mouth 4 (four) times daily as needed for diarrhea or loose stools. 11/06/20   Pennie Banter, DO  nitroGLYCERIN (NITROSTAT) 0.4 MG SL tablet Place 1 tablet under tongue every 5 minutes as needed for chest pain. (No more  than 3 doses within 15 minutes) 03/08/19   Flinchum, [name], FNP  warfarin (COUMADIN) 4 MG tablet Take 1 tablet (4 mg total) by mouth daily. 11/06/20 12/06/20  Pennie Banter, DO    Physical Exam: Vitals:   12/12/20 2000 12/12/20 2030 12/12/20 2230 12/12/20 2300  BP: (!) 103/51 108/62 (!) 102/42 (!) 136/54  Pulse: (!) 58 68 (!) 45 (!) 55  Resp: 15 18 17  (!) 23  Temp:      TempSrc:      SpO2: 100% 100% 95% 97%  Weight:      Height:       Physical Exam Vitals reviewed.  Constitutional:      Appearance: He is normal weight. He is diaphoretic. He is not ill-appearing.  HENT:     Head: Normocephalic and atraumatic.     Right Ear: External ear normal.     Left Ear: External ear normal.  Cardiovascular:     Rate and Rhythm: Normal rate and regular rhythm.     Pulses: Normal pulses.     Heart sounds: Normal heart sounds.  Pulmonary:     Effort: Pulmonary effort is normal.     Breath sounds: Wheezing present.  Abdominal:     General: Bowel sounds are normal.     Palpations: Abdomen is soft. There is no mass.  Musculoskeletal:     Right lower leg: Edema present.     Left lower leg: Edema present.  Neurological:     General: No focal deficit present.     Labs on Admission: I have personally reviewed following labs and imaging studies  No results for input(s): CKTOTAL, CKMB, TROPONINI in the last 72 hours. Lab Results  Component Value Date   WBC 5.9 12/12/2020   HGB 10.3 (L) 12/12/2020   HCT 33.3 (L) 12/12/2020   MCV 95.1 12/12/2020   PLT 212 12/12/2020    Recent Labs  Lab 12/12/20 1700  NA 131*  K 5.5*  CL 84*  CO2 41*  BUN 28*  CREATININE 1.10  CALCIUM 8.8*  PROT 6.6  BILITOT 1.2  ALKPHOS 56  ALT 9  AST 25  GLUCOSE 117*  COVID-19 Labs No results for input(s): DDIMER, FERRITIN, LDH, CRP in the last 72 hours. Lab Results  Component Value Date   SARSCOV2NAA NEGATIVE 12/12/2020   SARSCOV2NAA NEGATIVE 11/03/2020   SARSCOV2NAA NEGATIVE 10/08/2020    SARSCOV2NAA POSITIVE (A) 08/30/2020   Radiological Exams on Admission: CT HEAD WO CONTRAST ( )  Result Date: 12/12/2020 CLINICAL DATA:  Altered level of consciousness, shortness of breath EXAM: CT HEAD WITHOUT CONTRAST TECHNIQUE: Contiguous axial images were obtained from the base of the skull through the vertex without intravenous contrast. COMPARISON:  07/10/2019 FINDINGS: Brain: No acute infarct or hemorrhage. Lateral ventricles and midline structures are unremarkable. No acute extra-axial fluid collections. No mass effect. Vascular: No hyperdense vessel or unexpected calcification. Skull: Normal. Negative for fracture or focal lesion. Sinuses/Orbits: Left mastoid effusion. Paranasal sinuses are unremarkable. Other: None. IMPRESSION: 1. No acute intracranial process. 2. Left mastoid effusion. Electronically Signed   By: Sharlet Salina M.D.   On: 12/12/2020 17:55   DG Chest Portable 1 View  Result Date: 12/12/2020 CLINICAL DATA:  Shortness of breath. EXAM: PORTABLE CHEST 1 VIEW COMPARISON:  November 03, 2020. FINDINGS: Stable cardiomegaly. Mild bibasilar subsegmental atelectasis is noted. Small left pleural effusion may be present. Bony thorax is unremarkable. IMPRESSION: Mild bibasilar subsegmental atelectasis. Possible small left pleural effusion. Electronically Signed   By: Lupita Raider M.D.   On: 12/12/2020 16:28    EKG: Independently reviewed.  A. fib 67, Q waves present in V3, left bundle branch block no ST-T wave changes.    09/23/2020 Echocardiogram: IMPRESSION: 1. Left ventricular ejection fraction, by estimation, is 25 to 30%. The  left ventricle has severely decreased function. The left ventricle  demonstrates global hypokinesis. The left ventricular internal cavity size  was mildly dilated. Left ventricular  diastolic parameters were normal.   2. Right ventricular systolic function is normal. The right ventricular  size is mildly enlarged.   3. Left atrial size was mildly  dilated.   4. Right atrial size was mildly dilated.   5. The mitral valve was not well visualized. Mild mitral valve  regurgitation.   6. The aortic valve was not well visualized. Aortic valve regurgitation  is trivial.   Assessment/Plan Principal Problem:   SOB (shortness of breath) Active Problems:   Acute on chronic respiratory failure with hypoxia and hypercapnia (HCC)   Acute on chronic combined systolic and diastolic CHF (congestive heart failure) (HCC)   Altered mental status   PE (pulmonary thromboembolism) (HCC)   Atrial fibrillation (HCC)   CAD (coronary artery disease)   HTN (hypertension)   Hypomagnesemia   Type 2 diabetes mellitus with other specified complication (HCC)   Iron deficiency anemia   Alcohol abuse   Diet-controlled diabetes mellitus (HCC)   Obstructive sleep apnea   Hypothyroidism   Shortness of breath/acute on chronic respiratory failure with hypoxia and hypercapnia/COPD/acute on chronic combined congestive heart failure: Will admit patient to progressive unit with continuous cardiac monitoring. Supportive care with supplemental oxygen, breathing treatments as needed. Will also obtain evaluation of her heart with echocardiogram.  We will also obtain an a.m. chest x-ray.  We also need daily weights strict I's and O's. We will continue patient on Lasix and anti-ischemic medications and medical therapy regimen. As needed MDIs, nebulizer therapy as needed.  Start Solu-Medrol taper.  Altered mental status: Differentials include hypoxia related confusion or disorientation, however patient's risk for TIA CVA is also high.  Patient's risk of VTE is moderate to high and will keep  patient on continuous monitored status.  Schedule patient for an MRI of the brain. Aspiration/fall precautions.  History of pulmonary embolism/atrial fibrillation: Patient history of pulmonary embolism. Continue to monitor PT/INR and consult pharmacy for Coumadin  management. Patient's high risk for VTE strokelike event.  C/H a.fib: Currently he is in sinus rhythm, INR today is 1.2. Pt is very confused initially then settles down is currently on BiPAP.   CAD: We will continue patient on aspirin 81, Lipitor, Coreg, lisinopril.  Hypertension: Blood pressure (!) 136/54, pulse (!) 55, temperature 98.2 F (36.8 C), temperature source Axillary, resp. rate (!) 23, height 5\' 9"  (1.753 m), weight (!) 190.1 kg, SpO2 97 %. We will continue patient on aspirin 81, lisinopril 2.5, Coreg.  Hypomagnesemia: Currently 1.8, will follow levels and replace as appropriate.    Diabetes mellitus type 2: Sliding scale insulin, glycemic protocol. We will obtain an A1c currently patient is off any glycemic agents.  Iron deficiency anemia: Patient's CBC shows normal white count hemoglobin of 10.3 and RDW of 16 platelets of 212. Will obtain stool guaiac, anemia panel, type and screen, IV PPI.   Obstructive sleep apnea: Will discuss patient's home CPAP settings, or AutoPap.   Hypothyroidism: We will continue patient on his home dose of 75 mcg of levothyroxine.   DVT prophylaxis:  Coumadin   Code Status:  Full Code    Family Communication:  Fern, Martha (Sister)  [phone] (Mobile)   Disposition Plan:  SNF    Consults called:  None   Admission status: Inpatient.     Gertha Calkin MD [facility] [phone] How to contact the [facility] Attending or Consulting provider 7A - 7P or covering provider during after hours 7P -7A, for this patient.    Check the care team in [facility] and look for a) attending/consulting [facility] provider listed and b) the [facility] team listed Log into [email or web address] and use [facility]'s universal password to access. If you do not have the password, please contact the hospital operator. Locate the [facility] provider you are looking for under [facility] and page to a number that you can be directly reached. If you still have  difficulty reaching the provider, please page the [facility] (Director on Call) for the Hospitalists listed on amion for assistance. [email or web address] Password [email or web address] 12/13/2020, 12:02 AM

## 2020-12-12 NOTE — ED Notes (Signed)
This RN spoke with lab re: adding Hgb A1C on to blood already in lab. They will add this on.

## 2020-12-12 NOTE — Progress Notes (Signed)
Patient was placed on bipap earlier per md order. Abg obtained. Transported patient on bipap to CT scan and back without incident

## 2020-12-13 ENCOUNTER — Inpatient Hospital Stay (HOSPITAL_COMMUNITY)
Admit: 2020-12-13 | Discharge: 2020-12-13 | Disposition: A | Discharge status: Home / Self Care | Payer: Medicaid Other | Attending: Internal Medicine | Admitting: Internal Medicine

## 2020-12-13 DIAGNOSIS — I5021 Acute systolic (congestive) heart failure: Secondary | ICD-10-CM

## 2020-12-13 LAB — BASIC METABOLIC PANEL
Anion gap: 9 (ref 5–15)
BUN: 30 mg/dL — ABNORMAL HIGH (ref 6–20)
CO2: 34 mmol/L — ABNORMAL HIGH (ref 22–32)
Calcium: 9 mg/dL (ref 8.9–10.3)
Chloride: 89 mmol/L — ABNORMAL LOW (ref 98–111)
Creatinine, Ser: 1.04 mg/dL (ref 0.61–1.24)
GFR, Estimated: >60 mL/min (ref >60)
Glucose, Bld: 141 mg/dL — ABNORMAL HIGH (ref 70–99)
Potassium: 5 mmol/L (ref 3.5–5.1)
Sodium: 132 mmol/L — ABNORMAL LOW (ref 135–145)

## 2020-12-13 LAB — ECHOCARDIOGRAM COMPLETE
AR max vel: 1.57 cm2
AV Peak grad: 9.9 mmHg
Ao pk vel: 1.57 m/s
Area-P 1/2: 5.02 cm2
Height: 69 in
S' Lateral: 5.5 cm
Weight: 6707.2 oz

## 2020-12-13 LAB — PROTIME-INR
INR: 1.2 (ref 0.8–1.2)
Prothrombin Time: 15 seconds (ref 11.4–15.2)

## 2020-12-13 LAB — CBC
HCT: 37.2 % — ABNORMAL LOW (ref 39.0–52.0)
Hemoglobin: 11.8 g/dL — ABNORMAL LOW (ref 13.0–17.0)
MCH: 30.1 pg (ref 26.0–34.0)
MCHC: 31.7 g/dL (ref 30.0–36.0)
MCV: 94.9 fL (ref 80.0–100.0)
Platelets: 167 10*3/uL (ref 150–400)
RBC: 3.92 MIL/uL — ABNORMAL LOW (ref 4.22–5.81)
RDW: 15.8 % — ABNORMAL HIGH (ref 11.5–15.5)
WBC: 6.1 10*3/uL (ref 4.0–10.5)
nRBC: 0 % (ref 0.0–0.2)

## 2020-12-13 LAB — CBG MONITORING, ED
Glucose-Capillary: 183 mg/dL — ABNORMAL HIGH (ref 70–99)
Glucose-Capillary: 168 mg/dL — ABNORMAL HIGH (ref 70–99)

## 2020-12-13 LAB — HEPARIN LEVEL (UNFRACTIONATED): Heparin Unfractionated: 0.2 IU/mL — ABNORMAL LOW (ref 0.30–0.70)

## 2020-12-13 LAB — TROPONIN I (HIGH SENSITIVITY): Troponin I (High Sensitivity): 12 ng/L (ref <18)

## 2020-12-13 MED ORDER — HYDRALAZINE HCL 20 MG/ML IJ SOLN: 5.0000 mg | INTRAMUSCULAR | Status: DC | PRN

## 2020-12-13 MED ORDER — ACETAMINOPHEN 325 MG PO TABS: 650.0000 mg | ORAL_TABLET | Freq: Four times a day (QID) | ORAL | Status: DC | PRN

## 2020-12-13 MED ORDER — FLUTICASONE-UMECLIDIN-VILANT 100-62.5-25 MCG/ACT IN AEPB: 1.0000 | INHALATION_SPRAY | Freq: Every day | RESPIRATORY_TRACT | Status: DC

## 2020-12-13 MED ORDER — ALLOPURINOL 100 MG PO TABS
100.0000 mg | ORAL_TABLET | Freq: Every day | ORAL | Status: DC
  Administered 2020-12-13 – 2020-12-16 (×4): 100 mg via ORAL
  Filled 2020-12-13 (×4): qty 1

## 2020-12-13 MED ORDER — HEPARIN BOLUS VIA INFUSION
4000.0000 [IU] | Freq: Once | INTRAVENOUS | Status: AC
  Administered 2020-12-13: 4000 [IU] via INTRAVENOUS
  Filled 2020-12-13: qty 4000

## 2020-12-13 MED ORDER — GABAPENTIN 300 MG PO CAPS
300.0000 mg | ORAL_CAPSULE | Freq: Two times a day (BID) | ORAL | Status: DC
  Administered 2020-12-13 – 2020-12-16 (×7): 300 mg via ORAL
  Filled 2020-12-13 (×8): qty 1

## 2020-12-13 MED ORDER — WARFARIN SODIUM 4 MG PO TABS
8.0000 mg | ORAL_TABLET | Freq: Once | ORAL | Status: AC
  Administered 2020-12-13: 8 mg via ORAL
  Filled 2020-12-13 (×2): qty 2

## 2020-12-13 MED ORDER — ATORVASTATIN CALCIUM 20 MG PO TABS
40.0000 mg | ORAL_TABLET | Freq: Every day | ORAL | Status: DC
  Administered 2020-12-13 – 2020-12-16 (×4): 40 mg via ORAL
  Filled 2020-12-13 (×4): qty 2

## 2020-12-13 MED ORDER — PERFLUTREN LIPID MICROSPHERE
1.0000 mL | INTRAVENOUS | Status: AC | PRN
Start: 2020-12-13 — End: 2020-12-13
  Administered 2020-12-13: 2 mL via INTRAVENOUS
  Filled 2020-12-13: qty 10

## 2020-12-13 MED ORDER — LISINOPRIL 5 MG PO TABS
2.5000 mg | ORAL_TABLET | Freq: Every day | ORAL | Status: DC
  Filled 2020-12-13: qty 1

## 2020-12-13 MED ORDER — ASPIRIN EC 81 MG PO TBEC
81.0000 mg | DELAYED_RELEASE_TABLET | Freq: Every day | ORAL | Status: DC
  Administered 2020-12-13 – 2020-12-16 (×4): 81 mg via ORAL
  Filled 2020-12-13 (×4): qty 1

## 2020-12-13 MED ORDER — FLUTICASONE PROPIONATE 50 MCG/ACT NA SUSP
1.0000 | Freq: Every day | NASAL | Status: DC
  Administered 2020-12-13: 1 via NASAL
  Filled 2020-12-13: qty 16

## 2020-12-13 MED ORDER — INSULIN ASPART 100 UNIT/ML IJ SOLN
0.0000 [IU] | Freq: Three times a day (TID) | INTRAMUSCULAR | Status: DC
  Administered 2020-12-13: 1 [IU] via SUBCUTANEOUS
  Administered 2020-12-14: 2 [IU] via SUBCUTANEOUS
  Administered 2020-12-14 – 2020-12-16 (×4): 1 [IU] via SUBCUTANEOUS
  Filled 2020-12-13 (×5): qty 1

## 2020-12-13 MED ORDER — BUDESONIDE 0.5 MG/2ML IN SUSP
0.5000 mg | Freq: Two times a day (BID) | RESPIRATORY_TRACT | Status: DC
  Administered 2020-12-13 – 2020-12-16 (×7): 0.5 mg via RESPIRATORY_TRACT
  Filled 2020-12-13 (×6): qty 2

## 2020-12-13 MED ORDER — LEVOTHYROXINE SODIUM 50 MCG PO TABS
75.0000 ug | ORAL_TABLET | Freq: Every day | ORAL | Status: DC
  Administered 2020-12-14 – 2020-12-16 (×3): 75 ug via ORAL
  Filled 2020-12-13 (×2): qty 1
  Filled 2020-12-13: qty 2

## 2020-12-13 MED ORDER — HEPARIN (PORCINE) 25000 UT/250ML-% IV SOLN
2100.0000 [IU]/h | INTRAVENOUS | Status: DC
  Administered 2020-12-13: 1800 [IU]/h via INTRAVENOUS
  Filled 2020-12-13: qty 250

## 2020-12-13 MED ORDER — ALPRAZOLAM 0.5 MG PO TABS
0.5000 mg | ORAL_TABLET | Freq: Four times a day (QID) | ORAL | Status: DC | PRN
  Administered 2020-12-13 – 2020-12-16 (×6): 0.5 mg via ORAL
  Filled 2020-12-13 (×6): qty 1

## 2020-12-13 MED ORDER — OXYCODONE HCL 5 MG PO TABS
5.0000 mg | ORAL_TABLET | Freq: Four times a day (QID) | ORAL | Status: DC | PRN
  Administered 2020-12-15 – 2020-12-16 (×5): 5 mg via ORAL
  Filled 2020-12-13 (×5): qty 1

## 2020-12-13 MED ORDER — NITROGLYCERIN 0.4 MG SL SUBL: 0.4000 mg | SUBLINGUAL_TABLET | SUBLINGUAL | Status: DC | PRN

## 2020-12-13 MED ORDER — BISACODYL 10 MG RE SUPP
10.0000 mg | Freq: Once | RECTAL | Status: DC
Start: 2020-12-13 — End: 2020-12-16
  Filled 2020-12-13: qty 1

## 2020-12-13 MED ORDER — COLCHICINE 0.6 MG PO TABS
0.6000 mg | ORAL_TABLET | Freq: Every day | ORAL | Status: DC
  Administered 2020-12-13 – 2020-12-16 (×4): 0.6 mg via ORAL
  Filled 2020-12-13 (×4): qty 1

## 2020-12-13 MED ORDER — OXYCODONE-ACETAMINOPHEN 10-325 MG PO TABS: 1.0000 | ORAL_TABLET | Freq: Four times a day (QID) | ORAL | Status: DC | PRN

## 2020-12-13 MED ORDER — AMIODARONE HCL 200 MG PO TABS
200.0000 mg | ORAL_TABLET | Freq: Two times a day (BID) | ORAL | Status: DC
  Administered 2020-12-14 – 2020-12-16 (×5): 200 mg via ORAL
  Filled 2020-12-13 (×8): qty 1

## 2020-12-13 MED ORDER — ARFORMOTEROL TARTRATE 15 MCG/2ML IN NEBU
15.0000 ug | INHALATION_SOLUTION | Freq: Two times a day (BID) | RESPIRATORY_TRACT | Status: DC
  Administered 2020-12-13 – 2020-12-16 (×7): 15 ug via RESPIRATORY_TRACT
  Filled 2020-12-13 (×9): qty 2

## 2020-12-13 MED ORDER — UMECLIDINIUM BROMIDE 62.5 MCG/ACT IN AEPB
1.0000 | INHALATION_SPRAY | Freq: Every day | RESPIRATORY_TRACT | Status: DC
  Administered 2020-12-13: 1 via RESPIRATORY_TRACT
  Filled 2020-12-13: qty 7

## 2020-12-13 MED ORDER — INSULIN ASPART 100 UNIT/ML IJ SOLN
0.0000 [IU] | Freq: Every day | INTRAMUSCULAR | Status: DC
Start: 2020-12-13 — End: 2020-12-16

## 2020-12-13 MED ORDER — FOLIC ACID 1 MG PO TABS
1.0000 mg | ORAL_TABLET | Freq: Every day | ORAL | Status: DC
  Administered 2020-12-13 – 2020-12-16 (×4): 1 mg via ORAL
  Filled 2020-12-13 (×4): qty 1

## 2020-12-13 MED ORDER — WARFARIN - PHARMACIST DOSING INPATIENT
Freq: Every day | Status: DC
  Filled 2020-12-13: qty 1

## 2020-12-13 MED ORDER — FLUTICASONE FUROATE-VILANTEROL 100-25 MCG/ACT IN AEPB
1.0000 | INHALATION_SPRAY | Freq: Every day | RESPIRATORY_TRACT | Status: DC
  Administered 2020-12-13: 1 via RESPIRATORY_TRACT
  Filled 2020-12-13: qty 28

## 2020-12-13 MED ORDER — IPRATROPIUM-ALBUTEROL 0.5-2.5 (3) MG/3ML IN SOLN: 3.0000 mL | Freq: Four times a day (QID) | RESPIRATORY_TRACT | Status: DC | PRN

## 2020-12-13 MED ORDER — ALBUTEROL SULFATE (2.5 MG/3ML) 0.083% IN NEBU: 2.5000 mg | INHALATION_SOLUTION | Freq: Four times a day (QID) | RESPIRATORY_TRACT | Status: DC | PRN

## 2020-12-13 MED ORDER — HEPARIN BOLUS VIA INFUSION
3500.0000 [IU] | Freq: Once | INTRAVENOUS | Status: DC
  Filled 2020-12-13: qty 3500

## 2020-12-13 MED ORDER — OXYCODONE-ACETAMINOPHEN 5-325 MG PO TABS
1.0000 | ORAL_TABLET | Freq: Four times a day (QID) | ORAL | Status: DC | PRN
  Administered 2020-12-13 – 2020-12-16 (×7): 1 via ORAL
  Filled 2020-12-13 (×7): qty 1

## 2020-12-13 NOTE — Progress Notes (Signed)
*  PRELIMINARY RESULTS* Echocardiogram 2D Echocardiogram has been performed.  [name] [name] 12/13/2020, 9:55 AM

## 2020-12-13 NOTE — ED Notes (Signed)
Echo in progress at bedside.

## 2020-12-13 NOTE — ED Notes (Signed)
Secure msg sent to Dr. Allena Katz re: hemoccult test.

## 2020-12-13 NOTE — ED Notes (Addendum)
This RN called [facility] at [phone] & spoke with staff. She does not even see coumadin/warfarin anywhere in his chart. Pharmacist notified/updated.

## 2020-12-13 NOTE — ED Notes (Signed)
Pt sleeping, resting comfortably in bed, NAD, chest rise & fall. No needs identified at this time. Bed low & locked. Call light & personal items within reach. 

## 2020-12-13 NOTE — Progress Notes (Addendum)
ANTICOAGULATION CONSULT NOTE - Initial Consult  Pharmacy Consult for Heparin, Warfarin Indication: atrial fibrillation, Hx of PE   No Known Allergies  Patient Measurements: Height: 5\' 9"  (175.3 cm) Weight: (!) 190.1 kg (419 lb 3.2 oz) IBW/kg (Calculated) : 70.7 Heparin Dosing Weight: 118.9 kg   Vital Signs: Temp: 98.2 F (36.8 C) (10/28 1620) Temp Source: Axillary (10/28 1620) BP: 105/51 (10/29 0125) Pulse Rate: 57 (10/29 0125)  Labs: Recent Labs    12/12/20 1700 12/12/20 1811  HGB 10.3*  --   HCT 33.3*  --   PLT 212  --   LABPROT  --  15.7*  INR  --  1.2  CREATININE 1.10  --   TROPONINIHS 21*  --     Estimated Creatinine Clearance: 119.7 mL/min (by C-G formula based on SCr of 1.1 mg/dL).   Medical History: Past Medical History:  Diagnosis Date   Allergy    Anxiety    Arthritis    Asthma    Brain damage    Chronic pain of both knees 07/13/2018   Clotting disorder (HCC)    COPD (chronic obstructive pulmonary disease) (HCC)    Depression    GERD (gastroesophageal reflux disease)    HFrEF (heart failure with reduced ejection fraction) (HCC)    a. 03/2018 Echo: EF 25-30%, diff HK. Mod LAE.   History of DVT (deep vein thrombosis)    History of pulmonary embolism    a. Chronic coumadin.   Hypertension    MI (myocardial infarction) (HCC)    Morbid obesity (HCC)    Neck pain 07/13/2018   NICM (nonischemic cardiomyopathy) (HCC)    a. s/p Cath x 3 - reportedly nl cors. Last cath 2019 in [place]; b. a. 03/2018 Echo: EF 25-30%, diff HK.   Persistent atrial fibrillation (HCC)    a. 03/2018 s/p DCCV; b. CHA2DS2VASc = 1-->Xarelto (later changed to warfarin); c. 05/2018 recurrent afib-->Amio initiated.   Sleep apnea    Sleep apnea     Medications:  (Not in a hospital admission)   Assessment: Pharmacy consulted to dose heparin and warfarin in this 60 year old male admitted with AFib , Hx of PE.   Pt was on warfarin 4 mg PO daily at home but unable to determine last  dose. Pt is on BiPAP and has been belligerent with nursing staff so PO meds are not appropriate at this time.  MD wants to bridge with Heparin gtt until INR is therapeutic.  MD also concerned about possibility of bleeding since H&H are low.   CrCl = 119.7 ml/min  10/28:  INR @ 1811 = 1.2              Hgb @ 1700 = 10.3, Hct = 33.3  Goal of Therapy:  INR 2-3 Heparin level 0.3-0.7 units/ml Monitor platelets by anticoagulation protocol: Yes   Plan:  Give 4000 units bolus x 1 Start heparin infusion at 1800 units/hr Check anti-Xa level in 6 hours and daily while on heparin Continue to monitor H&H and platelets  Kalia Vahey D 12/13/2020,1:31 AM

## 2020-12-13 NOTE — ED Notes (Addendum)
This RN called [facility] (to find out last dose of coumadin, as it is not on the [facility] sent with pt) at [phone], no answer.

## 2020-12-13 NOTE — Progress Notes (Addendum)
ANTICOAGULATION CONSULT NOTE - Initial Consult  Pharmacy Consult for Warfarin Indication: atrial fibrillation, Hx of PE   No Known Allergies  Patient Measurements: Height: 5\' 9"  (175.3 cm) Weight: (!) 190.1 kg (419 lb 3.2 oz) IBW/kg (Calculated) : 70.7 Heparin Dosing Weight: 118.9 kg   Vital Signs: BP: 127/85 (10/29 0730) Pulse Rate: 62 (10/29 0730)  Labs: Recent Labs    12/12/20 1700 12/12/20 1811 12/13/20 0812 12/13/20 0815  HGB 10.3*  --  11.8*  --   HCT 33.3*  --  37.2*  --   PLT 212  --  167  --   LABPROT  --  15.7* 15.0  --   INR  --  1.2 1.2  --   HEPARINUNFRC  --   --  0.20*  --   CREATININE 1.10  --  1.04  --   TROPONINIHS 21*  --   --  12     Estimated Creatinine Clearance: 126.6 mL/min (by C-G formula based on SCr of 1.04 mg/dL).   Medical History: Past Medical History:  Diagnosis Date   Allergy    Anxiety    Arthritis    Asthma    Brain damage    Chronic pain of both knees 07/13/2018   Clotting disorder (HCC)    COPD (chronic obstructive pulmonary disease) (HCC)    Depression    GERD (gastroesophageal reflux disease)    HFrEF (heart failure with reduced ejection fraction) (HCC)    a. 03/2018 Echo: EF 25-30%, diff HK. Mod LAE.   History of DVT (deep vein thrombosis)    History of pulmonary embolism    a. Chronic coumadin.   Hypertension    MI (myocardial infarction) (HCC)    Morbid obesity (HCC)    Neck pain 07/13/2018   NICM (nonischemic cardiomyopathy) (HCC)    a. s/p Cath x 3 - reportedly nl cors. Last cath 2019 in [place]; b. a. 03/2018 Echo: EF 25-30%, diff HK.   Persistent atrial fibrillation (HCC)    a. 03/2018 s/p DCCV; b. CHA2DS2VASc = 1-->Xarelto (later changed to warfarin); c. 05/2018 recurrent afib-->Amio initiated.   Sleep apnea    Sleep apnea     Medications:  (Not in a hospital admission)  Assessment: Pharmacy consulted to dose warfarin in this 60 year old male admitted with AFib, Hx of PE.  Pt was on warfarin 6 mg PO daily at  home but unable to determine last dose. Pt has had history of noncompliance regarding medication.     CrCl = 119.7 ml/min  Goal of Therapy:  INR 2-3 Monitor platelets by anticoagulation protocol: Yes   Plan:  --INR 1.2, subtherapeutic --Will order Warfarin 8 mg x 1 this evening --CBC daily  Arwilda Georgia A Thomasene Dubow 12/13/2020,9:26 AM

## 2020-12-13 NOTE — Progress Notes (Signed)
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  Apparently, patient went to set his normal baseline 1 day ago but has now since been more confused and seemingly short of breath.  On EMS arrival, they report finding him hypoxic on his 4 L nasal cannula in the mid 80s requiring up to 10 L to maintain adequate SPO2.  Unable to obtain any further ROS from patient given his mental status and asking inappropriate questions.  In the ED, temperature 98.2 F, HR 66, RR 22, BP 120/58, SPO2 100% on 15 L NRB.  Patient was subsequently placed on BiPAP on ED arrival.  Sodium 131, potassium 5.5, chloride 84, CO2 41, BUN 28, glucose 1.10, glucose 117, magnesium 1.8, AST 25, ALT 9, total bilirubin 1.2.  BNP 203.7, high sensitive troponin 21.  WBC 5.9, hemoglobin 10.3, platelets 212.  INR 1.2.  COVID-19 PCR negative.  Influenza A/B PCR negative.  CT head without contrast with no acute intracranial process.  Chest x-ray with mild bibasilar subsegmental atelectasis, possible small left pleural effusion.  ABG with pH 7.50, PaCO2 50, PaO2 160 on 40% FiO2/BiPAP.   Assessment & Plan:   Principal Problem:   SOB (shortness of breath) Active Problems:   Acute on chronic respiratory failure with hypoxia and hypercapnia (HCC)   Obstructive sleep apnea   Type 2 diabetes  mellitus with other specified complication (HCC)   HTN (hypertension)   PE (pulmonary thromboembolism) (HCC)   CAD (coronary artery disease)   Hypothyroidism   Iron deficiency anemia   Alcohol abuse   Diet-controlled diabetes mellitus (HCC)   Atrial fibrillation (HCC)   Acute on chronic combined systolic and diastolic CHF (congestive heart failure) (HCC)   Hypomagnesemia   Altered mental status   Acute metabolic encephalopathy, POA: Improving Patient presenting to ED from his nursing facility after being found confused, short of breath and hypoxic.  Etiology likely multifactorial with acute COPD exacerbation, diastolic heart failure exacerbation and presumed medical noncompliance with his home CPAP.  Patient is also followed by hospice outpatient. --Continue treatment as below  Acute on chronic hypoxic, hypercapnic respiratory failure, POA At baseline, patient on 3-4 L nasal cannula and CPAP.  Etiology likely multifactorial in the setting of COPD/CHF exacerbation and medical noncompliance. --Continue BiPAP nocturnally and while asleep --Treatment as below for COPD/CHF  Chronic systolic and diastolic congestive heart failure, decompensated TTE 09/23/2020 with LVEF 25-30%, LV severely decreased function with global hypokinesis, LV mildly dilated, LA/RA mildly dilated, mild MR. BNP elevated on admission.  Home regimen includes carvedilol 6.25 mg p.o. twice daily, spironolactone 12.5 mg p.o. daily, torsemide 40 mg p.o. daily, lisinopril 2.5 mg p.o. daily --Repeat TTE: Pending --We will hold home carvedilol, spironolactone, torsemide, lisinopril given borderline hypotension --Furosemide 40 mg IV every 12 hours --Strict I's and O's and daily weights  Acute COPD exacerbation Patient was noted to have SPO2 74% on 4 L nasal cannula, initially placed on 15 L NRB followed by BiPAP. --Brovana neb  BID --Pulmicort neb BID -- Solu-Medrol 40 mg IV q12h --Albuterol/duo nebs every 6 hours as needed  wheezing/shortness of breath --BiPAP while sleeping/napping --Continue supple oxygen, maintain SPO2 greater than 88%; on 3-4L at baseline  History of paroxysmal atrial fibrillation History of DVT/PE --Amiodarone 200 mg p.o. twice daily --Warfarin, pharmacy consulted for dosing/monitoring  Type 2 diabetes mellitus Diet controlled at baseline.  Diet controlled at baseline.  Hemoglobin A1c 6.3 on 09/04/2020, well controlled.   --SSI for coverage --CBGs qAC/HS  Essential hypertension Nonischemic cardiomyopathy Home regimen includes carvedilol 6.25 mg p.o. twice daily, spironolactone 12.5 mg p.o. daily, torsemide 40 mg p.o. daily, lisinopril 2.5 mg p.o. daily --Holding home meds for now given borderline hypotension --Continue IV furosemide as above --Hydralazine 5mg  IV q4h PRN SBP >160 or SBP >110 --Continue monitor BP closely  Hyperlipidemia: Atorvastatin 40 mg p.o. daily  Depression/anxiety: --Xanax 0.5 mg p.o. every 6 hours as needed anxiety  Hypothyroidism --Levothyroxine 75 mcg p.o. daily  Gout: --Colchicine 0.6 mg p.o. daily --Allopurinol 100 mg p.o. daily  Chronic pain syndrome --Gabapentin 300 mg p.o. twice daily --Oxycodone as needed  Obstructive sleep apnea Obesity hypoventilation syndrome --Continue nocturnal CPAP/BiPAP and while napping during the day  Morbid obesity Body mass index is 61.91 kg/m.  Discussed with patient needs for aggressive lifestyle changes/weight loss as this complicates all facets of care.  Outpatient follow-up with PCP.      DVT prophylaxis: SCDs Start: 12/12/20 2126 warfarin (COUMADIN) tablet 8 mg    Code Status: DNR Family Communication: No family present at bedside this morning  Disposition Plan:  Level of care: Progressive Cardiac Status is: Inpatient  Remains inpatient appropriate because: Requiring continuous BiPAP, continues to be intermittently confused, requiring IV diuresis for decompensated CHF, IV steroids and neb  treatments for acute COPD exacerbation.  Anticipate discharge back to [facility] under hospice care in 2-3 days.    Consultants:  No  Procedures:  None  Antimicrobials:  BiPAP   Subjective: Patient seen examined bedside, resting comfortably.  Continues on BiPAP in ED holding area.  Seems less confused.  Answering questions more appropriately.  Wants to take mask off.  No other questions or concerns at this time.  Denies headache, no chest pain, no abdominal pain.  No acute events overnight per nursing staff.  Objective: Vitals:   12/13/20 0500 12/13/20 0730 12/13/20 1039 12/13/20 1110  BP: 114/61 127/85 139/74 (!) 80/55  Pulse: (!) 101 62 68   Resp: 20 20 20    Temp:   98 F (36.7 C)   TempSrc:   Oral   SpO2: 93% 99% 97%   Weight:      Height:       No intake or output data in the 24 hours ending 12/13/20 1302 Filed Weights   12/12/20 1622  Weight: (!) 190.1 kg    Examination:  General exam: Appears calm and comfortable, NAD, on BiPAP, morbidly obese Respiratory system: Decreased breath sounds bilateral bases, mild wheezing/crackles, normal respiratory effort on BiPAP with FiO2 40% Cardiovascular system: S1 & S2 heard, RRR. No JVD, murmurs, rubs, gallops or clicks. No pedal edema. Gastrointestinal system: Abdomen is nondistended, protuberant abdomen, soft and nontender. No organomegaly or masses felt. Normal bowel sounds heard. Central nervous system: Alert and oriented. No focal neurological deficits. Extremities: Symmetric 5 x 5 power. Skin: No rashes, lesions or ulcers Psychiatry: Judgement and insight appear poor. Mood & affect appropriate.     Data Reviewed: I have personally reviewed following labs  and imaging studies  CBC: Recent Labs  Lab 12/12/20 1700 12/13/20 0812  WBC 5.9 6.1  NEUTROABS 3.9  --   HGB 10.3* 11.8*  HCT 33.3* 37.2*  MCV 95.1 94.9  PLT 212 167   Basic Metabolic Panel: Recent Labs  Lab 12/12/20 1700 12/13/20 0812  NA  131* 132*  K 5.5* 5.0  CL 84* 89*  CO2 41* 34*  GLUCOSE 117* 141*  BUN 28* 30*  CREATININE 1.10 1.04  CALCIUM 8.8* 9.0  MG 1.8  --    GFR: Estimated Creatinine Clearance: 126.6 mL/min (by C-G formula based on SCr of 1.04 mg/dL). Liver Function Tests: Recent Labs  Lab 12/12/20 1700  AST 25  ALT 9  ALKPHOS 56  BILITOT 1.2  PROT 6.6  ALBUMIN 3.2*   No results for input(s): LIPASE, AMYLASE in the last 168 hours. No results for input(s): AMMONIA in the last 168 hours. Coagulation Profile: Recent Labs  Lab 12/12/20 1811 12/13/20 0812  INR 1.2 1.2   Cardiac Enzymes: No results for input(s): CKTOTAL, CKMB, CKMBINDEX, TROPONINI in the last 168 hours. BNP (last 3 results) No results for input(s): PROBNP in the last 8760 hours. HbA1C: No results for input(s): HGBA1C in the last 72 hours. CBG: No results for input(s): GLUCAP in the last 168 hours. Lipid Profile: No results for input(s): CHOL, HDL, LDLCALC, TRIG, CHOLHDL, LDLDIRECT in the last 72 hours. Thyroid Function Tests: No results for input(s): TSH, T4TOTAL, FREET4, T3FREE, THYROIDAB in the last 72 hours. Anemia Panel: No results for input(s): VITAMINB12, FOLATE, FERRITIN, TIBC, IRON, RETICCTPCT in the last 72 hours. Sepsis Labs: No results for input(s): PROCALCITON, LATICACIDVEN in the last 168 hours.  Recent Results (from the past 240 hour(s))  Resp Panel by RT-PCR (Flu A&B, Covid) Nasopharyngeal Swab     Status: None   Collection Time: 12/12/20  5:00 PM   Specimen: Nasopharyngeal Swab; Nasopharyngeal(NP) swabs in vial transport medium  Result Value Ref Range Status   SARS Coronavirus 2 by RT PCR NEGATIVE NEGATIVE Final    Comment: (NOTE) SARS-CoV-2 target nucleic acids are NOT DETECTED.  The SARS-CoV-2 RNA is generally detectable in upper respiratory specimens during the acute phase of infection. The lowest concentration of SARS-CoV-2 viral copies this assay can detect is 138 copies/mL. A negative result does  not preclude SARS-Cov-2 infection and should not be used as the sole basis for treatment or other patient management decisions. A negative result may occur with  improper specimen collection/handling, submission of specimen other than nasopharyngeal swab, presence of viral mutation(s) within the areas targeted by this assay, and inadequate number of viral copies(<138 copies/mL). A negative result must be combined with clinical observations, patient history, and epidemiological information. The expected result is Negative.  Fact Sheet for Patients:  BloggerCourse.com  Fact Sheet for Healthcare Providers:  SeriousBroker.it  This test is no t yet approved or cleared by the [place] FDA and  has been authorized for detection and/or diagnosis of SARS-CoV-2 by FDA under an Emergency Use Authorization (EUA). This EUA will remain  in effect (meaning this test can be used) for the duration of the COVID-19 declaration under Section 564(b)(1) of the Act, 21 U.S.C.section 360bbb-3(b)(1), unless the authorization is terminated  or revoked sooner.       Influenza A by PCR NEGATIVE NEGATIVE Final   Influenza B by PCR NEGATIVE NEGATIVE Final    Comment: (NOTE) The Xpert Xpress SARS-CoV-2/FLU/RSV plus assay is intended as an aid in the diagnosis of  influenza from Nasopharyngeal swab specimens and should not be used as a sole basis for treatment. Nasal washings and aspirates are unacceptable for Xpert Xpress SARS-CoV-2/FLU/RSV testing.  Fact Sheet for Patients: BloggerCourse.com  Fact Sheet for Healthcare Providers: SeriousBroker.it  This test is not yet approved or cleared by the [place] FDA and has been authorized for detection and/or diagnosis of SARS-CoV-2 by FDA under an Emergency Use Authorization (EUA). This EUA will remain in effect (meaning this test can be used) for the  duration of the COVID-19 declaration under Section 564(b)(1) of the Act, 21 U.S.C. section 360bbb-3(b)(1), unless the authorization is terminated or revoked.  Performed at [facility], [address], [place], [place] [address]          Radiology Studies: CT HEAD WO CONTRAST ( )  Result Date: 12/12/2020 CLINICAL DATA:  Altered level of consciousness, shortness of breath EXAM: CT HEAD WITHOUT CONTRAST TECHNIQUE: Contiguous axial images were obtained from the base of the skull through the vertex without intravenous contrast. COMPARISON:  07/10/2019 FINDINGS: Brain: No acute infarct or hemorrhage. Lateral ventricles and midline structures are unremarkable. No acute extra-axial fluid collections. No mass effect. Vascular: No hyperdense vessel or unexpected calcification. Skull: Normal. Negative for fracture or focal lesion. Sinuses/Orbits: Left mastoid effusion. Paranasal sinuses are unremarkable. Other: None. IMPRESSION: 1. No acute intracranial process. 2. Left mastoid effusion. Electronically Signed   By: Sharlet Salina M.D.   On: 12/12/2020 17:55   DG Chest Portable 1 View  Result Date: 12/12/2020 CLINICAL DATA:  Shortness of breath. EXAM: PORTABLE CHEST 1 VIEW COMPARISON:  November 03, 2020. FINDINGS: Stable cardiomegaly. Mild bibasilar subsegmental atelectasis is noted. Small left pleural effusion may be present. Bony thorax is unremarkable. IMPRESSION: Mild bibasilar subsegmental atelectasis. Possible small left pleural effusion. Electronically Signed   By: Lupita Raider M.D.   On: 12/12/2020 16:28        Scheduled Meds:  amiodarone  200 mg Oral BID   arformoterol  15 mcg Nebulization BID   aspirin EC  81 mg Oral Daily   bisacodyl  10 mg Rectal Once   budesonide (PULMICORT) nebulizer solution  0.5 mg Nebulization BID   colchicine  0.6 mg Oral Daily   docusate sodium  100 mg Oral BID   fluticasone  1 spray Each Nare Daily   folic acid  1 mg Oral Daily    furosemide  40 mg Intravenous BID   gabapentin  300 mg Oral BID   lisinopril  2.5 mg Oral Daily   methylPREDNISolone (SOLU-MEDROL) injection  40 mg Intravenous Q12H   nicotine  14 mg Transdermal Daily   sodium chloride flush  3 mL Intravenous Q12H   warfarin  8 mg Oral ONCE-1600   Warfarin - Pharmacist Dosing Inpatient   Does not apply q1600   Continuous Infusions:   LOS: 1 day    Time spent: 48 minutes spent on chart review, discussion with nursing staff, consultants, updating family and interview/physical exam; more than 50% of that time was spent in counseling and/or coordination of care.    Alvira Philips [place], DO [facility] Available via Epic secure chat 7am-7pm After these hours, please refer to coverage provider listed on [email or web address] 12/13/2020, 1:02 PM

## 2020-12-13 NOTE — Progress Notes (Signed)
PT Cancellation Note  Patient Details Name: [name] [name] MRN: [id number] DOB: [birth year]   Cancelled Treatment:    Reason Eval/Treat Not Completed: Patient not medically ready PT orders received, chart reviewed. Pt noted to be to be on bipap & not medically appropriate for exertional activity. Will f/u as able & as pt is appropriate.  Aleda Grana, PT, DPT 12/13/20, 7:38 AM    Sandi Mariscal 12/13/2020, 7:37 AM

## 2020-12-13 NOTE — ED Notes (Signed)
BIPAP machine alarming, this RN entered room to determine source of alarm Patient lying in bed with BIPAP mask laying on his lap This RN asked patient why he took his mask off, patient states "I can't breathe" This RN explained importance of wearing BIPAP mask since his oxygen saturation level drops to the 50's without it Patient states he is not going to wear the mask  This RN informed patient that removing the mask would lead to further respiratory distress and possible intubation  Patient states he refuses to be intubated This RN placed mask back on patient, instructed him not to remove it again Side rails up, call bell within reach, will continue to monitor

## 2020-12-13 NOTE — ED Notes (Signed)
Pt sleeping, resting comfortably, chest rise & fall, NAD. No needs identified at this time. Bed low & locked; call light & personal items within reach.

## 2020-12-14 ENCOUNTER — Encounter: Payer: Self-pay | Admitting: Internal Medicine

## 2020-12-14 DIAGNOSIS — J9601 Acute respiratory failure with hypoxia: Secondary | ICD-10-CM | POA: Diagnosis present

## 2020-12-14 DIAGNOSIS — R0602 Shortness of breath: Secondary | ICD-10-CM | POA: Diagnosis present

## 2020-12-14 DIAGNOSIS — J9621 Acute and chronic respiratory failure with hypoxia: Secondary | ICD-10-CM | POA: Diagnosis present

## 2020-12-14 LAB — GLUCOSE, CAPILLARY
Glucose-Capillary: 200 mg/dL — ABNORMAL HIGH (ref 70–99)
Glucose-Capillary: 146 mg/dL — ABNORMAL HIGH (ref 70–99)
Glucose-Capillary: 198 mg/dL — ABNORMAL HIGH (ref 70–99)

## 2020-12-14 LAB — BASIC METABOLIC PANEL
Anion gap: 5 (ref 5–15)
BUN: 31 mg/dL — ABNORMAL HIGH (ref 6–20)
CO2: 42 mmol/L — ABNORMAL HIGH (ref 22–32)
Calcium: 8.8 mg/dL — ABNORMAL LOW (ref 8.9–10.3)
Chloride: 89 mmol/L — ABNORMAL LOW (ref 98–111)
Creatinine, Ser: 0.86 mg/dL (ref 0.61–1.24)
GFR, Estimated: >60 mL/min (ref >60)
Glucose, Bld: 160 mg/dL — ABNORMAL HIGH (ref 70–99)
Potassium: 4.9 mmol/L (ref 3.5–5.1)
Sodium: 136 mmol/L (ref 135–145)

## 2020-12-14 LAB — PROTIME-INR
INR: 1.2 (ref 0.8–1.2)
Prothrombin Time: 14.7 seconds (ref 11.4–15.2)

## 2020-12-14 LAB — MAGNESIUM: Magnesium: 2.1 mg/dL (ref 1.7–2.4)

## 2020-12-14 LAB — CBG MONITORING, ED: Glucose-Capillary: 223 mg/dL — ABNORMAL HIGH (ref 70–99)

## 2020-12-14 MED ORDER — BISACODYL 5 MG PO TBEC
5.0000 mg | DELAYED_RELEASE_TABLET | Freq: Once | ORAL | Status: AC
  Administered 2020-12-14: 21:00:00 5 mg via ORAL
  Filled 2020-12-14: qty 1

## 2020-12-14 MED ORDER — FLUTICASONE PROPIONATE 50 MCG/ACT NA SUSP
2.0000 | Freq: Every day | NASAL | Status: DC
  Administered 2020-12-14 – 2020-12-16 (×3): 2 via NASAL
  Filled 2020-12-14: qty 16

## 2020-12-14 MED ORDER — PREDNISONE 20 MG PO TABS
40.0000 mg | ORAL_TABLET | Freq: Every day | ORAL | Status: DC
  Administered 2020-12-15 – 2020-12-16 (×2): 40 mg via ORAL
  Filled 2020-12-14 (×2): qty 2

## 2020-12-14 MED ORDER — WARFARIN SODIUM 4 MG PO TABS
8.0000 mg | ORAL_TABLET | Freq: Once | ORAL | Status: AC
  Administered 2020-12-14: 8 mg via ORAL
  Filled 2020-12-14: qty 2

## 2020-12-14 MED ORDER — SENNOSIDES-DOCUSATE SODIUM 8.6-50 MG PO TABS
1.0000 | ORAL_TABLET | Freq: Two times a day (BID) | ORAL | Status: DC
  Administered 2020-12-14 – 2020-12-16 (×4): 1 via ORAL
  Filled 2020-12-14 (×4): qty 1

## 2020-12-14 MED ORDER — LORATADINE 10 MG PO TABS
10.0000 mg | ORAL_TABLET | Freq: Every day | ORAL | Status: DC
  Administered 2020-12-14 – 2020-12-16 (×3): 10 mg via ORAL
  Filled 2020-12-14 (×3): qty 1

## 2020-12-14 MED ORDER — ADULT MULTIVITAMIN W/MINERALS CH
1.0000 | ORAL_TABLET | Freq: Every day | ORAL | Status: DC
  Administered 2020-12-14 – 2020-12-16 (×3): 1 via ORAL
  Filled 2020-12-14 (×3): qty 1

## 2020-12-14 MED ORDER — ENSURE MAX PROTEIN PO LIQD
11.0000 [oz_av] | Freq: Two times a day (BID) | ORAL | Status: DC
  Administered 2020-12-14 – 2020-12-16 (×4): 11 [oz_av] via ORAL
  Filled 2020-12-14: qty 330

## 2020-12-14 NOTE — Progress Notes (Signed)
Patient states he does not want the Nicoderm patch. He does not smoke, nor has he ever smoked. He wants the patch removed from his medication list. MD notified

## 2020-12-14 NOTE — Progress Notes (Signed)
Pt not on BiPAP at this time. 

## 2020-12-14 NOTE — ED Notes (Signed)
Secure msg sent to B. Jon Billings, NP re: pts level of care.

## 2020-12-14 NOTE — ED Notes (Signed)
Pt sleeping, resting comfortably in bed, NAD, chest rise & fall. No needs identified at this time. Bed low & locked; call light & personal items within reach. 

## 2020-12-14 NOTE — ED Notes (Signed)
Maggie RN aware of assigned bed 

## 2020-12-14 NOTE — Progress Notes (Signed)
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  Apparently, patient went to set his normal baseline 1 day ago but has now since been more confused and seemingly short of breath.  On EMS arrival, they report finding him hypoxic on his 4 L nasal cannula in the mid 80s requiring up to 10 L to maintain adequate SPO2.  Unable to obtain any further ROS from patient given his mental status and asking inappropriate questions.  In the ED, temperature 98.2 F, HR 66, RR 22, BP 120/58, SPO2 100% on 15 L NRB.  Patient was subsequently placed on BiPAP on ED arrival.  Sodium 131, potassium 5.5, chloride 84, CO2 41, BUN 28, glucose 1.10, glucose 117, magnesium 1.8, AST 25, ALT 9, total bilirubin 1.2.  BNP 203.7, high sensitive troponin 21.  WBC 5.9, hemoglobin 10.3, platelets 212.  INR 1.2.  COVID-19 PCR negative.  Influenza A/B PCR negative.  CT head without contrast with no acute intracranial process.  Chest x-ray with mild bibasilar subsegmental atelectasis, possible small left pleural effusion.  ABG with pH 7.50, PaCO2 50, PaO2 160 on 40% FiO2/BiPAP.   Assessment & Plan:   Principal Problem:   SOB (shortness of breath) Active Problems:   Acute on chronic respiratory failure with hypoxia and hypercapnia (HCC)   Obstructive sleep apnea   Type 2 diabetes  mellitus with other specified complication (HCC)   HTN (hypertension)   PE (pulmonary thromboembolism) (HCC)   CAD (coronary artery disease)   Hypothyroidism   Iron deficiency anemia   Alcohol abuse   Diet-controlled diabetes mellitus (HCC)   Atrial fibrillation (HCC)   Acute on chronic combined systolic and diastolic CHF (congestive heart failure) (HCC)   Hypomagnesemia   Altered mental status   Acute on chronic respiratory failure with hypoxia (HCC)   Shortness of breath   Acute respiratory failure with hypoxia (HCC)   Acute metabolic encephalopathy, POA: Improving Patient presenting to ED from his nursing facility after being found confused, short of breath and hypoxic.  Etiology likely multifactorial with acute COPD exacerbation, diastolic heart failure exacerbation and presumed medical noncompliance with his home CPAP.  Patient is also followed by hospice outpatient. --Continue treatment as below  Acute on chronic hypoxic, hypercapnic respiratory failure, POA At baseline, patient on 3-4 L nasal cannula and CPAP.  Etiology likely multifactorial in the setting of COPD/CHF exacerbation and medical noncompliance. --Continue BiPAP nocturnally and while asleep --Treatment as below for COPD/CHF  Chronic systolic and diastolic congestive heart failure, decompensated TTE 09/23/2020 with LVEF 25-30%, LV severely decreased function with global hypokinesis, LV mildly dilated, LA/RA mildly dilated, mild MR. BNP elevated on admission.  Home regimen includes carvedilol 6.25 mg p.o. twice daily, spironolactone 12.5 mg p.o. daily, torsemide 40 mg p.o. daily, lisinopril 2.5 mg p.o. daily.  TTE with LVEF 25-30%, LV function severely decreased with global hypokinesis which is similar in appearance to recent echo August 2022. --Holding home carvedilol, spironolactone, torsemide, lisinopril given borderline hypotension --net  negative 1L since admission --Wt 190.1>no weight recorded today --Furosemide 40  mg IV q12h --Strict I's and O's and daily weights  Acute COPD exacerbation Patient was noted to have SPO2 74% on 4 L nasal cannula, initially placed on 15 L NRB followed by BiPAP. --Brovana neb BID --Pulmicort neb BID --Solu-Medrol 40 mg IV q12h; transition to prednisone 40 mg PO daily 10/31 --Albuterol/duo nebs every 6 hours as needed wheezing/shortness of breath --BiPAP while sleeping/napping --Continue supplemental oxygen, maintain SPO2 > 88%; on 3-4L at baseline; currently on 4L [place]  History of paroxysmal atrial fibrillation History of DVT/PE --Amiodarone 200 mg p.o. twice daily --Warfarin, pharmacy consulted for dosing/monitoring  Type 2 diabetes mellitus Diet controlled at baseline.  Diet controlled at baseline.  Hemoglobin A1c 6.3 on 09/04/2020, well controlled.   --SSI for coverage --CBGs qAC/HS  Essential hypertension Nonischemic cardiomyopathy Home regimen includes carvedilol 6.25 mg p.o. twice daily, spironolactone 12.5 mg p.o. daily, torsemide 40 mg p.o. daily, lisinopril 2.5 mg p.o. daily --Holding home meds for now given borderline hypotension --Continue IV furosemide as above --Hydralazine 5mg  IV q4h PRN SBP >160 or SBP >110 --Continue monitor BP closely  Hyperlipidemia: Atorvastatin 40 mg p.o. daily  Depression/anxiety: --Xanax 0.5 mg p.o. every 6 hours as needed anxiety  Hypothyroidism --Levothyroxine 75 mcg p.o. daily  Gout: --Colchicine 0.6 mg p.o. daily --Allopurinol 100 mg p.o. daily  Chronic pain syndrome --Gabapentin 300 mg p.o. twice daily --Oxycodone as needed  Obstructive sleep apnea Obesity hypoventilation syndrome --Continue nocturnal CPAP/BiPAP and while napping during the day  Morbid obesity Body mass index is 61.91 kg/m.  Discussed with patient needs for aggressive lifestyle changes/weight loss as this complicates all facets of care.  Outpatient follow-up with PCP.      DVT prophylaxis: SCDs Start: 12/12/20 2126    Code Status:  DNR Family Communication: No family present at bedside this morning  Disposition Plan:  Level of care: Med-Surg Status is: Inpatient  Remains inpatient appropriate because: Requiring continuous BiPAP, continues to be intermittently confused, requiring IV diuresis for decompensated CHF, IV steroids and neb treatments for acute COPD exacerbation.  Anticipate discharge back to [facility] under hospice care in 1-2 days.    Consultants:  No  Procedures:  None  Antimicrobials:  BiPAP   Subjective: Patient seen examined bedside, resting comfortably.  States "I have been better".  States he wore his BiPAP overnight for some time.  Oxygen now titrated down from 15 L to 4 L nasal cannula.  Continues with shortness of breath.  Continues to complain of chronic right shoulder pain.  Confusion appears now resolved.  No other questions or concerns at this time.  Denies headache, no chest pain, no abdominal pain.  No acute events overnight per nursing staff.  Objective: Vitals:   12/14/20 0700 12/14/20 0730 12/14/20 0830 12/14/20 1000  BP: 109/85 106/75 91/71 (!) 113/46  Pulse: 75 (!) 32 64   Resp: (!) 23 (!) 23 (!) 25 20  Temp:    98.1 F (36.7 C)  TempSrc:    Oral  SpO2: 99% 99% 94% 96%  Weight:      Height:        Intake/Output Summary (Last 24 hours) at 12/14/2020 1115 Last data filed at 12/13/2020 1947 Gross per 24 hour  Intake --  Output 1000 ml  Net -1000 ml   Filed Weights   12/12/20 1622  Weight: (!) 190.1 kg    Examination:  General exam: Appears calm and comfortable, NAD,  morbidly obese, appears older than stated age Respiratory system: Decreased breath sounds bilateral bases, mild wheezing/crackles, normal respiratory effort on 4 L nasal cannula Cardiovascular system: S1 & S2 heard, RRR. No JVD, murmurs, rubs, gallops or clicks. No pedal edema. Gastrointestinal system: Abdomen is nondistended, protuberant abdomen, soft and nontender. No organomegaly or  masses felt. Normal bowel sounds heard. Central nervous system: Alert and oriented. No focal neurological deficits. Extremities: Symmetric 5 x 5 power. Skin: No rashes, lesions or ulcers Psychiatry: Judgement and insight appear poor. Mood & affect appropriate.     Data Reviewed: I have personally reviewed following labs and imaging studies  CBC: Recent Labs  Lab 12/12/20 1700 12/13/20 0812  WBC 5.9 6.1  NEUTROABS 3.9  --   HGB 10.3* 11.8*  HCT 33.3* 37.2*  MCV 95.1 94.9  PLT 212 167   Basic Metabolic Panel: Recent Labs  Lab 12/12/20 1700 12/13/20 0812 12/14/20 0622  NA 131* 132* 136  K 5.5* 5.0 4.9  CL 84* 89* 89*  CO2 41* 34* 42*  GLUCOSE 117* 141* 160*  BUN 28* 30* 31*  CREATININE 1.10 1.04 0.86  CALCIUM 8.8* 9.0 8.8*  MG 1.8  --  2.1   GFR: Estimated Creatinine Clearance: 153.1 mL/min (by C-G formula based on SCr of 0.86 mg/dL). Liver Function Tests: Recent Labs  Lab 12/12/20 1700  AST 25  ALT 9  ALKPHOS 56  BILITOT 1.2  PROT 6.6  ALBUMIN 3.2*   No results for input(s): LIPASE, AMYLASE in the last 168 hours. No results for input(s): AMMONIA in the last 168 hours. Coagulation Profile: Recent Labs  Lab 12/12/20 1811 12/13/20 0812 12/14/20 0622  INR 1.2 1.2 1.2   Cardiac Enzymes: No results for input(s): CKTOTAL, CKMB, CKMBINDEX, TROPONINI in the last 168 hours. BNP (last 3 results) No results for input(s): PROBNP in the last 8760 hours. HbA1C: No results for input(s): HGBA1C in the last 72 hours. CBG: Recent Labs  Lab 12/13/20 1542 12/13/20 2257 12/14/20 0852  GLUCAP 183* 168* 223*   Lipid Profile: No results for input(s): CHOL, HDL, LDLCALC, TRIG, CHOLHDL, LDLDIRECT in the last 72 hours. Thyroid Function Tests: No results for input(s): TSH, T4TOTAL, FREET4, T3FREE, THYROIDAB in the last 72 hours. Anemia Panel: No results for input(s): VITAMINB12, FOLATE, FERRITIN, TIBC, IRON, RETICCTPCT in the last 72 hours. Sepsis Labs: No results  for input(s): PROCALCITON, LATICACIDVEN in the last 168 hours.  Recent Results (from the past 240 hour(s))  Resp Panel by RT-PCR (Flu A&B, Covid) Nasopharyngeal Swab     Status: None   Collection Time: 12/12/20  5:00 PM   Specimen: Nasopharyngeal Swab; Nasopharyngeal(NP) swabs in vial transport medium  Result Value Ref Range Status   SARS Coronavirus 2 by RT PCR NEGATIVE NEGATIVE Final    Comment: (NOTE) SARS-CoV-2 target nucleic acids are NOT DETECTED.  The SARS-CoV-2 RNA is generally detectable in upper respiratory specimens during the acute phase of infection. The lowest concentration of SARS-CoV-2 viral copies this assay can detect is 138 copies/mL. A negative result does not preclude SARS-Cov-2 infection and should not be used as the sole basis for treatment or other patient management decisions. A negative result may occur with  improper specimen collection/handling, submission of specimen other than nasopharyngeal swab, presence of viral mutation(s) within the areas targeted by this assay, and inadequate number of viral copies(<138 copies/mL). A negative result must be combined with clinical observations, patient history, and epidemiological information. The expected result is Negative.  Fact Sheet for  Patients:  BloggerCourse.com  Fact Sheet for Healthcare Providers:  SeriousBroker.it  This test is no t yet approved or cleared by the [place] FDA and  has been authorized for detection and/or diagnosis of SARS-CoV-2 by FDA under an Emergency Use Authorization (EUA). This EUA will remain  in effect (meaning this test can be used) for the duration of the COVID-19 declaration under Section 564(b)(1) of the Act, 21 U.S.C.section 360bbb-3(b)(1), unless the authorization is terminated  or revoked sooner.       Influenza A by PCR NEGATIVE NEGATIVE Final   Influenza B by PCR NEGATIVE NEGATIVE Final    Comment: (NOTE) The  Xpert Xpress SARS-CoV-2/FLU/RSV plus assay is intended as an aid in the diagnosis of influenza from Nasopharyngeal swab specimens and should not be used as a sole basis for treatment. Nasal washings and aspirates are unacceptable for Xpert Xpress SARS-CoV-2/FLU/RSV testing.  Fact Sheet for Patients: BloggerCourse.com  Fact Sheet for Healthcare Providers: SeriousBroker.it  This test is not yet approved or cleared by the [place] FDA and has been authorized for detection and/or diagnosis of SARS-CoV-2 by FDA under an Emergency Use Authorization (EUA). This EUA will remain in effect (meaning this test can be used) for the duration of the COVID-19 declaration under Section 564(b)(1) of the Act, 21 U.S.C. section 360bbb-3(b)(1), unless the authorization is terminated or revoked.  Performed at [facility], [address] [place], [place] [address]          Radiology Studies: CT HEAD WO CONTRAST ( )  Result Date: 12/12/2020 CLINICAL DATA:  Altered level of consciousness, shortness of breath EXAM: CT HEAD WITHOUT CONTRAST TECHNIQUE: Contiguous axial images were obtained from the base of the skull through the vertex without intravenous contrast. COMPARISON:  07/10/2019 FINDINGS: Brain: No acute infarct or hemorrhage. Lateral ventricles and midline structures are unremarkable. No acute extra-axial fluid collections. No mass effect. Vascular: No hyperdense vessel or unexpected calcification. Skull: Normal. Negative for fracture or focal lesion. Sinuses/Orbits: Left mastoid effusion. Paranasal sinuses are unremarkable. Other: None. IMPRESSION: 1. No acute intracranial process. 2. Left mastoid effusion. Electronically Signed   By: Sharlet Salina M.D.   On: 12/12/2020 17:55   DG Chest Portable 1 View  Result Date: 12/12/2020 CLINICAL DATA:  Shortness of breath. EXAM: PORTABLE CHEST 1 VIEW COMPARISON:  November 03, 2020.  FINDINGS: Stable cardiomegaly. Mild bibasilar subsegmental atelectasis is noted. Small left pleural effusion may be present. Bony thorax is unremarkable. IMPRESSION: Mild bibasilar subsegmental atelectasis. Possible small left pleural effusion. Electronically Signed   By: Lupita Raider M.D.   On: 12/12/2020 16:28   ECHOCARDIOGRAM COMPLETE  Result Date: 12/13/2020    ECHOCARDIOGRAM REPORT   Patient Name:   AZHAR SUBIA Date of Exam: 12/13/2020 Medical Rec #:  [id number]              Height:       69.0 in Accession #:    [id number]             Weight:       419.2 lb Date of Birth:  30-Dec-1960              BSA:          2.830 m Patient Age:    60 years               BP:           136/54 mmHg Patient Gender: M  HR:           63 bpm. Exam Location:  [facility] Procedure: 2D Echo, Cardiac Doppler, Color Doppler and Intracardiac            Opacification Agent Indications:     CHF-Acute Systolic 428.21 / I50.21  History:         Patient has prior history of Echocardiogram examinations. CHF;                  Risk Factors:Hypertension. MI.  Sonographer:     Neysa Bonito Roar Referring Phys:  [id number] Eliezer Mccoy PATEL Diagnosing Phys: Chilton Si MD IMPRESSIONS  1. Left ventricular ejection fraction, by estimation, is 25 to 30%. The left ventricle has severely decreased function. The left ventricle demonstrates global hypokinesis. The left ventricular internal cavity size was moderately dilated. There is mild left ventricular hypertrophy of the basal-septal segment. Left ventricular diastolic parameters are indeterminate.  2. Right ventricular systolic function is normal. The right ventricular size is normal.  3. The mitral valve is normal in structure. No evidence of mitral valve regurgitation. No evidence of mitral stenosis.  4. The aortic valve is normal in structure. Aortic valve regurgitation is not visualized. No aortic stenosis is present. Comparison(s): Essentially unchanged from 09/2020.  FINDINGS  Left Ventricle: Left ventricular ejection fraction, by estimation, is 25 to 30%. The left ventricle has severely decreased function. The left ventricle demonstrates global hypokinesis. The left ventricular internal cavity size was moderately dilated. There is mild left ventricular hypertrophy of the basal-septal segment. Left ventricular diastolic parameters are indeterminate. Right Ventricle: The right ventricular size is normal. No increase in right ventricular wall thickness. Right ventricular systolic function is normal. Left Atrium: Left atrial size was normal in size. Right Atrium: Right atrial size was normal in size. Pericardium: There is no evidence of pericardial effusion. Mitral Valve: The mitral valve is normal in structure. No evidence of mitral valve regurgitation. No evidence of mitral valve stenosis. Tricuspid Valve: The tricuspid valve is normal in structure. Tricuspid valve regurgitation is trivial. No evidence of tricuspid stenosis. Aortic Valve: The aortic valve is normal in structure. Aortic valve regurgitation is not visualized. No aortic stenosis is present. Aortic valve peak gradient measures 9.9 mmHg. Pulmonic Valve: The pulmonic valve was normal in structure. Pulmonic valve regurgitation is not visualized. No evidence of pulmonic stenosis. Aorta: The aortic root is normal in size and structure. Venous: The inferior vena cava was not well visualized. IAS/Shunts: No atrial level shunt detected by color flow Doppler. Additional Comments: Study is limited by poor endomyocardial border definition despite using Definity contrast.  LEFT VENTRICLE PLAX 2D LVIDd:         6.40 cm   Diastology LVIDs:         5.50 cm   LV e' medial:    12.70 cm/s LV PW:         1.00 cm   LV E/e' medial:  8.6 LV IVS:        1.10 cm   LV e' lateral:   13.50 cm/s LVOT diam:     1.90 cm   LV E/e' lateral: 8.1 LVOT Area:     2.84 cm  RIGHT VENTRICLE RV Mid diam:    3.50 cm RV S prime:     9.37 cm/s TAPSE  (M-mode): 1.8 cm LEFT ATRIUM         Index LA diam:    5.60 cm 1.98 cm/m  AORTIC VALVE  PULMONIC VALVE AV Area (Vmax): 1.57 cm     PV Vmax:        1.03 m/s AV Vmax:        157.00 cm/s  PV Peak grad:   4.2 mmHg AV Peak Grad:   9.9 mmHg     RVOT Peak grad: 2 mmHg LVOT Vmax:      86.80 cm/s  AORTA Ao Root diam: 3.40 cm MITRAL VALVE                TRICUSPID VALVE MV Area (PHT): 5.02 cm     TR Peak grad:   33.6 mmHg MV Decel Time: 151 msec     TR Vmax:        290.00 cm/s MV E velocity: 109.00 cm/s                             SHUNTS                             Systemic Diam: 1.90 cm Chilton Si MD Electronically signed by Chilton Si MD Signature Date/Time: 12/13/2020/2:54:05 PM    Final         Scheduled Meds:  allopurinol  100 mg Oral Daily   amiodarone  200 mg Oral BID   arformoterol  15 mcg Nebulization BID   aspirin EC  81 mg Oral Daily   atorvastatin  40 mg Oral Daily   bisacodyl  10 mg Rectal Once   budesonide (PULMICORT) nebulizer solution  0.5 mg Nebulization BID   colchicine  0.6 mg Oral Daily   docusate sodium  100 mg Oral BID   fluticasone  2 spray Each Nare Daily   folic acid  1 mg Oral Daily   furosemide  40 mg Intravenous BID   gabapentin  300 mg Oral BID   insulin aspart  0-5 Units Subcutaneous QHS   insulin aspart  0-6 Units Subcutaneous TID WC   levothyroxine  75 mcg Oral Q0600   loratadine  10 mg Oral Daily   methylPREDNISolone (SOLU-MEDROL) injection  40 mg Intravenous Q12H   nicotine  14 mg Transdermal Daily   sodium chloride flush  3 mL Intravenous Q12H   Warfarin - Pharmacist Dosing Inpatient   Does not apply q1600   Continuous Infusions:   LOS: 2 days    Time spent: 42 minutes spent on chart review, discussion with nursing staff, consultants, updating family and interview/physical exam; more than 50% of that time was spent in counseling and/or coordination of care.    Alvira Philips [place], DO [facility] Available via Epic secure  chat 7am-7pm After these hours, please refer to coverage provider listed on [email or web address] 12/14/2020, 11:15 AM

## 2020-12-14 NOTE — Evaluation (Signed)
Physical Therapy Evaluation Patient Details Name: [name] [name] MRN: [id number] DOB: [birth year] Today's Date: 12/14/2020  History of Present Illness  Pt is a 60 y/o M admitted on 12/12/20 from [facility] after being found confused, SOB, & hypoxic. Pt is being treated Acute metabolic encephalopathy & Acute on chronic hypoxic, hypercapnic respiratory failure. Pt initially required bipap but is now on nasal cannula. PMH: paroxysmal a-fib, PE, DVT on warfarin, iron deficiency anemia, DM2, HTN, gout, OSA on noctural CPAP, chronic hypoxic respiratory failure/COPD on 3-4L, obesity hypoventilation syndrome, nonischemic cardiomyopathy, TBI, chronic pain disorder, HLD, depression, anxiety, hypothyroidism, alcohol abuse  Clinical Impression  Pt seen for PT evaluation with pt agreeable & reporting he hasn't ambulated in a week 2/2 not feeling well. Pt reports he was at [facility] in [place] with plans to transition to LTC residency. On this date pt requires min assist to transfer BLE to EOB & relies heavily on bed rails & HOB to transition trunk to upright sitting. Pt is able to complete sit>stand & stand pivot to recliner with bariatric RW & CGA. Pt declines further gait 2/2 fatigue. Will continue to follow pt acutely to progress gait as able.       Recommendations for follow up therapy are one component of a multi-disciplinary discharge planning process, led by the attending physician.  Recommendations may be updated based on patient status, additional functional criteria and insurance authorization.  Follow Up Recommendations Skilled nursing-short term rehab (<3 hours/day)    Assistance Recommended at Discharge Frequent or constant Supervision/Assistance  Functional Status Assessment Patient has had a recent decline in their functional status and demonstrates the ability to make significant improvements in function in a reasonable and predictable amount of time.  Equipment  Recommendations  None recommended by PT    Recommendations for Other Services       Precautions / Restrictions Precautions Precautions: Fall Restrictions Weight Bearing Restrictions: No      Mobility  Bed Mobility Overal bed mobility: Needs Assistance Bed Mobility: Supine to Sit     Supine to sit: Min assist;HOB elevated     General bed mobility comments: min assist to move BLE towards EOB, heavy reliance on bed rails & HOB elevated to upright trunk to sitting EOB    Transfers Overall transfer level: Needs assistance Equipment used: Rolling walker (2 wheels) Transfers: Stand Pivot Transfers;Sit to/from Stand Sit to Stand: Supervision Stand pivot transfers: Supervision              Ambulation/Gait                Stairs            Wheelchair Mobility    Modified Rankin (Stroke Patients Only)       Balance Overall balance assessment: Needs assistance Sitting-balance support: Feet supported;Bilateral upper extremity supported Sitting balance-Leahy Scale: Good Sitting balance - Comments: supervision static sitting EOB   Standing balance support: Bilateral upper extremity supported;During functional activity Standing balance-Leahy Scale: Fair Standing balance comment: BUE support on RW with CGA                             Pertinent Vitals/Pain Pain Assessment: No/denies pain    Home Living Family/patient expects to be discharged to:: Unsure                   Additional Comments: Pt reports he was at [facility], participating  in STR in hopes of transitioning to long term care residency.    Prior Function               Mobility Comments: Pt reports he was ambulatory with RW until ~1 week ago.       Hand Dominance        Extremity/Trunk Assessment   Upper Extremity Assessment Upper Extremity Assessment: Generalized weakness    Lower Extremity Assessment Lower Extremity Assessment:  Generalized weakness    Cervical / Trunk Assessment Cervical / Trunk Assessment: Kyphotic  Communication   Communication: No difficulties  Cognition Arousal/Alertness: Awake/alert Behavior During Therapy: WFL for tasks assessed/performed (Lewd comments throughout session with poor acceptance of redirection.) Overall Cognitive Status: Within Functional Limits for tasks assessed                                          General Comments General comments (skin integrity, edema, etc.): Pt on 4L/min with SpO2 >90%    Exercises     Assessment/Plan    PT Assessment Patient needs continued PT services  PT Problem List Decreased strength;Decreased mobility;Decreased balance;Decreased activity tolerance;Decreased knowledge of use of DME;Obesity;Cardiopulmonary status limiting activity       PT Treatment Interventions DME instruction;Therapeutic activities;Modalities;Gait training;Therapeutic exercise;Patient/family education;Balance training;Functional mobility training;Neuromuscular re-education;Manual techniques    PT Goals (Current goals can be found in the Care Plan section)  Acute Rehab PT Goals Patient Stated Goal: get better PT Goal Formulation: With patient Time For Goal Achievement: 12/28/20 Potential to Achieve Goals: Fair    Frequency Min 2X/week   Barriers to discharge        Co-evaluation               AM-PAC PT "6 Clicks" Mobility  Outcome Measure Help needed turning from your back to your side while in a flat bed without using bedrails?: A Little Help needed moving from lying on your back to sitting on the side of a flat bed without using bedrails?: A Lot Help needed moving to and from a bed to a chair (including a wheelchair)?: A Little Help needed standing up from a chair using your arms (e.g., wheelchair or bedside chair)?: A Little Help needed to walk in hospital room?: A Little Help needed climbing 3-5 steps with a railing? : Total 6  Click Score: 15    End of Session Equipment Utilized During Treatment: Oxygen Activity Tolerance: Patient tolerated treatment well;Patient limited by fatigue Patient left: in chair;with chair alarm set;with call bell/phone within reach;with nursing/sitter in room Nurse Communication: Mobility status PT Visit Diagnosis: Unsteadiness on feet (R26.81);Muscle weakness (generalized) (M62.81);Difficulty in walking, not elsewhere classified (R26.2)    Time: 8921-1941 PT Time Calculation (min) (ACUTE ONLY): 20 min   Charges:   PT Evaluation $PT Eval Low Complexity: 1 Low          Aleda Grana, PT, DPT 12/14/20, 2:32 PM   Sandi Mariscal 12/14/2020, 2:30 PM

## 2020-12-14 NOTE — Progress Notes (Signed)
Initial Nutrition Assessment  DOCUMENTATION CODES:   Morbid obesity  INTERVENTION:   -Ensure Max po BID, each supplement provides 150 kcal and 30 grams of protein.   -MVI with minerals daily  NUTRITION DIAGNOSIS:   Increased nutrient needs related to chronic illness (COPD) as evidenced by estimated needs.  GOAL:   Patient will meet greater than or equal to 90% of their needs  MONITOR:   PO intake, Supplement acceptance, Labs, Weight trends, Skin, I & O's  REASON FOR ASSESSMENT:   Consult Assessment of nutrition requirement/status  ASSESSMENT:   [name] [name] is a 60 year old male with past medical history significant for paroxysmal atrial fibrillation, history of PE/DVT on warfarin, iron deficiency anemia, DM2,/hypertension, gout, OSA on nocturnal CPAP, chronic hypoxic respiratory failure/COPD on 3-4L baseline, obesity hypoventilation syndrome, nonischemic cardiomyopathy, TBI, chronic pain disorder, hyperlipidemia, depression/anxiety, hypothyroidism, history of alcohol abuse who presents to [facility] ED from [facility] on 10/28 after being found confused, short of breath and hypoxic.  Pt admitted with AMS and respiratory failure secondary to COPD and CHF exacerbation.   Reviewed I/O's: -1 L x 24 hours  Pt unavailable at time of visit. Attempted to speak with pt via call to hospital room phone, however, unable to reach. RD unable to obtain further nutrition-related history or complete nutrition-focused physical exam at this time.    No meal completion data currently available to assess at this time.    Reviewed wt hx; noted wt gain over the past 3 months. Suspect edema may be contributing to weight gain.   Obesity is a complex, chronic medical condition that is optimally managed by a multidisciplinary care team. Weight loss is not an ideal goal for an acute inpatient hospitalization. However, if further work-up for obesity is warranted, consider outpatient  referral to outpatient bariatric service and/or [facility].    Medications reviewed and include colace, folic acid, lasix, prednisone, and solu-medrol.    Lab Results  Component Value Date   HGBA1C 6.3 (H) 09/04/2020   PTA DM medications are none.   Labs reviewed: CBGS: 168-223 (inpatient orders for glycemic control are 0-5 units insulin aspart daily at bedtime and 0-6 units insulin aspart TID with meals).    Diet Order:   Diet Order             Diet Heart Room service appropriate? Yes; Fluid consistency: Thin; Fluid restriction: 1800 mL Fluid  Diet effective now                   EDUCATION NEEDS:   No education needs have been identified at this time  Skin:  Skin Assessment: Reviewed RN Assessment  Last BM:  Unknown  Height:   Ht Readings from Last 1 Encounters:  12/12/20 5\' 9"  (1.753 m)    Weight:   Wt Readings from Last 1 Encounters:  12/12/20 (!) 190.1 kg    Ideal Body Weight:  72.7 kg  BMI:  Body mass index is 61.91 kg/m.  Estimated Nutritional Needs:   Kcal:  2200-2400  Protein:  145-160 grams  Fluid:  > 2 L    12/14/20, RD, LDN, CDCES Registered Dietitian II Certified Diabetes Care and Education Specialist Please refer to [facility] for RD and/or RD on-call/weekend/after hours pager

## 2020-12-14 NOTE — Progress Notes (Addendum)
ANTICOAGULATION CONSULT NOTE - Initial Consult  Pharmacy Consult for Warfarin Indication: atrial fibrillation, Hx of PE   No Known Allergies  Patient Measurements: Height: 5\' 9"  (175.3 cm) Weight: (!) 190.1 kg (419 lb 3.2 oz) IBW/kg (Calculated) : 70.7 Heparin Dosing Weight: 118.9 kg   Vital Signs: BP: 91/71 (10/30 0830) Pulse Rate: 64 (10/30 0830)  Labs: Recent Labs    12/12/20 1700 12/12/20 1811 12/13/20 0812 12/13/20 0815 12/14/20 0622  HGB 10.3*  --  11.8*  --   --   HCT 33.3*  --  37.2*  --   --   PLT 212  --  167  --   --   LABPROT  --  15.7* 15.0  --  14.7  INR  --  1.2 1.2  --  1.2  HEPARINUNFRC  --   --  0.20*  --   --   CREATININE 1.10  --  1.04  --  0.86  TROPONINIHS 21*  --   --  12  --      Estimated Creatinine Clearance: 153.1 mL/min (by C-G formula based on SCr of 0.86 mg/dL).   Medical History: Past Medical History:  Diagnosis Date   Allergy    Anxiety    Arthritis    Asthma    Brain damage    Chronic pain of both knees 07/13/2018   Clotting disorder (HCC)    COPD (chronic obstructive pulmonary disease) (HCC)    Depression    GERD (gastroesophageal reflux disease)    HFrEF (heart failure with reduced ejection fraction) (HCC)    a. 03/2018 Echo: EF 25-30%, diff HK. Mod LAE.   History of DVT (deep vein thrombosis)    History of pulmonary embolism    a. Chronic coumadin.   Hypertension    MI (myocardial infarction) (HCC)    Morbid obesity (HCC)    Neck pain 07/13/2018   NICM (nonischemic cardiomyopathy) (HCC)    a. s/p Cath x 3 - reportedly nl cors. Last cath 2019 in [place]; b. a. 03/2018 Echo: EF 25-30%, diff HK.   Persistent atrial fibrillation (HCC)    a. 03/2018 s/p DCCV; b. CHA2DS2VASc = 1-->Xarelto (later changed to warfarin); c. 05/2018 recurrent afib-->Amio initiated.   Sleep apnea    Sleep apnea     Medications:  (Not in a hospital admission)  Assessment: Pharmacy consulted to dose warfarin in this 60 year old male admitted with  AFib, Hx of PE.  Pt was on warfarin 6 mg PO daily at home but unable to determine last dose. Pt has had history of noncompliance regarding medication.     Date INR Dose 10/29 1.2 8mg   CrCl = 153.1 ml/min  Goal of Therapy:  INR 2-3 Monitor platelets by anticoagulation protocol: Yes   Plan:  --INR 1.2, subtherapeutic --Will re-order Warfarin 8 mg x 1 this evening --CBC daily  Klein Willcox A Lean Jaeger 12/14/2020,9:37 AM

## 2020-12-15 LAB — HEMOGLOBIN A1C
Hgb A1c MFr Bld: 6.5 % — ABNORMAL HIGH (ref 4.8–5.6)
Mean Plasma Glucose: 140 mg/dL

## 2020-12-15 LAB — BASIC METABOLIC PANEL
Anion gap: 10 (ref 5–15)
BUN: 37 mg/dL — ABNORMAL HIGH (ref 6–20)
CO2: 38 mmol/L — ABNORMAL HIGH (ref 22–32)
Calcium: 9 mg/dL (ref 8.9–10.3)
Chloride: 88 mmol/L — ABNORMAL LOW (ref 98–111)
Creatinine, Ser: 0.87 mg/dL (ref 0.61–1.24)
GFR, Estimated: >60 mL/min (ref >60)
Glucose, Bld: 142 mg/dL — ABNORMAL HIGH (ref 70–99)
Potassium: 4.5 mmol/L (ref 3.5–5.1)
Sodium: 136 mmol/L (ref 135–145)

## 2020-12-15 LAB — GLUCOSE, CAPILLARY
Glucose-Capillary: 126 mg/dL — ABNORMAL HIGH (ref 70–99)
Glucose-Capillary: 174 mg/dL — ABNORMAL HIGH (ref 70–99)
Glucose-Capillary: 191 mg/dL — ABNORMAL HIGH (ref 70–99)
Glucose-Capillary: 179 mg/dL — ABNORMAL HIGH (ref 70–99)

## 2020-12-15 LAB — PROTIME-INR
INR: 1.4 — ABNORMAL HIGH (ref 0.8–1.2)
Prothrombin Time: 17.1 seconds — ABNORMAL HIGH (ref 11.4–15.2)

## 2020-12-15 LAB — MAGNESIUM: Magnesium: 1.9 mg/dL (ref 1.7–2.4)

## 2020-12-15 MED ORDER — APIXABAN 5 MG PO TABS
5.0000 mg | ORAL_TABLET | Freq: Two times a day (BID) | ORAL | Status: DC
  Administered 2020-12-15 – 2020-12-16 (×3): 5 mg via ORAL
  Filled 2020-12-15 (×2): qty 1

## 2020-12-15 MED ORDER — WARFARIN SODIUM 6 MG PO TABS
6.0000 mg | ORAL_TABLET | Freq: Once | ORAL | Status: DC
  Filled 2020-12-15: qty 1

## 2020-12-15 NOTE — Progress Notes (Signed)
ANTICOAGULATION CONSULT NOTE - Initial Consult  Pharmacy Consult for Warfarin Indication: atrial fibrillation, Hx of PE   No Known Allergies  Patient Measurements: Height: 5\' 9"  (175.3 cm) Weight: (!) 190.1 kg (419 lb 3.2 oz) IBW/kg (Calculated) : 70.7 Heparin Dosing Weight: 118.9 kg   Vital Signs: Temp: 97.9 F (36.6 C) (10/31 0612) Temp Source: Oral (10/31 0612) BP: 104/48 (10/31 0612) Pulse Rate: 48 (10/31 0612)  Labs: Recent Labs    12/12/20 1700 12/12/20 1811 12/13/20 0812 12/13/20 0815 12/14/20 0622 12/15/20 0555  HGB 10.3*  --  11.8*  --   --   --   HCT 33.3*  --  37.2*  --   --   --   PLT 212  --  167  --   --   --   LABPROT  --    < > 15.0  --  14.7 17.1*  INR  --    < > 1.2  --  1.2 1.4*  HEPARINUNFRC  --   --  0.20*  --   --   --   CREATININE 1.10  --  1.04  --  0.86 0.87  TROPONINIHS 21*  --   --  12  --   --    < > = values in this interval not displayed.     Estimated Creatinine Clearance: 151.3 mL/min (by C-G formula based on SCr of 0.87 mg/dL).   Medical History: Past Medical History:  Diagnosis Date   Allergy    Anxiety    Arthritis    Asthma    Brain damage    Chronic pain of both knees 07/13/2018   Clotting disorder (HCC)    COPD (chronic obstructive pulmonary disease) (HCC)    Depression    GERD (gastroesophageal reflux disease)    HFrEF (heart failure with reduced ejection fraction) (HCC)    a. 03/2018 Echo: EF 25-30%, diff HK. Mod LAE.   History of DVT (deep vein thrombosis)    History of pulmonary embolism    a. Chronic coumadin.   Hypertension    MI (myocardial infarction) (HCC)    Morbid obesity (HCC)    Neck pain 07/13/2018   NICM (nonischemic cardiomyopathy) (HCC)    a. s/p Cath x 3 - reportedly nl cors. Last cath 2019 in [place]; b. a. 03/2018 Echo: EF 25-30%, diff HK.   Persistent atrial fibrillation (HCC)    a. 03/2018 s/p DCCV; b. CHA2DS2VASc = 1-->Xarelto (later changed to warfarin); c. 05/2018 recurrent afib-->Amio initiated.    Sleep apnea    Sleep apnea     Medications:  Medications Prior to Admission  Medication Sig Dispense Refill Last Dose   allopurinol (ZYLOPRIM) 100 MG tablet Take 1 tablet (100 mg total) by mouth daily. 90 tablet 0 12/12/2020 at 0900   ALPRAZolam (XANAX) 0.5 MG tablet Take 0.5 mg by mouth every 6 (six) hours as needed for anxiety.   12/12/2020 at 1301   Amino Acids-Protein Hydrolys (FEEDING SUPPLEMENT, PRO-STAT SUGAR FREE 64,) LIQD Take 30 mLs by mouth 2 (two) times daily.   12/12/2020 at 0730   amiodarone (PACERONE) 200 MG tablet Take 200 mg by mouth 2 (two) times daily.   12/12/2020 at 0900   aspirin EC 81 MG tablet Take 81 mg by mouth daily. Swallow whole.   12/12/2020 at 0800   atorvastatin (LIPITOR) 40 MG tablet Take 1 tablet (40 mg total) by mouth daily. 90 tablet 0 12/12/2020 at 0800   carvedilol (COREG) 6.25 MG  tablet Take 1 tablet (6.25 mg total) by mouth 2 (two) times daily with a meal. 60 tablet 2 12/12/2020 at 0900   cetirizine (ZYRTEC) 10 MG tablet Take 10 mg by mouth daily.   12/12/2020 at 0800   fluticasone (FLONASE) 50 MCG/ACT nasal spray Place 1 spray into both nostrils daily.   12/12/2020 at 0800   Fluticasone-Umeclidin-Vilant (TRELEGY ELLIPTA) 100-62.5-25 MCG/INH AEPB Inhale 1 Inhaler into the lungs daily. 28 each EACH 12/11/2020 at 0900   folic acid (FOLVITE) 1 MG tablet Take 1 tablet (1 mg total) by mouth daily.   12/12/2020 at 0800   gabapentin (NEURONTIN) 300 MG capsule Take 300 mg by mouth 2 (two) times daily.   12/12/2020 at 0900   levothyroxine (SYNTHROID) 75 MCG tablet Take 1 tablet (75 mcg total) by mouth daily before breakfast. 90 tablet 0 12/12/2020 at 0600   lisinopril (ZESTRIL) 2.5 MG tablet Take 1 tablet (2.5 mg total) by mouth daily.   12/12/2020 at 0900   Multiple Vitamin (MULTIVITAMIN WITH MINERALS) TABS tablet Take 1 tablet by mouth daily.   12/12/2020 at 0900   nystatin (MYCOSTATIN/NYSTOP) powder Apply 1 application topically 2 (two) times daily.    12/12/2020 at 0900   oxyCODONE-acetaminophen (PERCOCET) 10-325 MG tablet Take 1 tablet by mouth every 6 (six) hours as needed for pain.   12/12/2020 at 1200   spironolactone (ALDACTONE) 25 MG tablet Take 0.5 tablets (12.5 mg total) by mouth daily. 15 tablet 2 12/12/2020 at 0800   torsemide (DEMADEX) 20 MG tablet Take 2 tablets (40 mg total) by mouth daily. 60 tablet 0 12/11/2020 at 1700   traZODone (DESYREL) 150 MG tablet Take 1 tablet (150 mg total) by mouth at bedtime.   12/11/2020 at 2100   warfarin (COUMADIN) 6 MG tablet Take 6 mg by mouth daily.   12/07/2020 at 1700   acetaminophen (TYLENOL) 325 MG tablet Take 2 tablets (650 mg total) by mouth every 6 (six) hours as needed for mild pain, fever or headache.   prn at prn   albuterol (VENTOLIN HFA) 108 (90 Base) MCG/ACT inhaler Inhale 2 puffs into the lungs every 6 (six) hours as needed for wheezing or shortness of breath. 8 g 2 prn at prn   calamine lotion Apply topically 2 (two) times daily. 120 mL 0 prn at prn   colchicine 0.6 MG tablet Take 1 tablet (0.6 mg total) by mouth daily. 90 tablet 0    ipratropium-albuterol (DUONEB) 0.5-2.5 (3) MG/3ML SOLN Take 3 mLs by nebulization every 6 (six) hours as needed. 360 mL  prn at prn   loperamide (IMODIUM) 2 MG capsule Take 1 capsule (2 mg total) by mouth 4 (four) times daily as needed for diarrhea or loose stools. 30 capsule 0 prn at prn   nitroGLYCERIN (NITROSTAT) 0.4 MG SL tablet Place 1 tablet under tongue every 5 minutes as needed for chest pain. (No more than 3 doses within 15 minutes) 15 tablet 3 12/06/2020 at 1705   warfarin (COUMADIN) 4 MG tablet Take 1 tablet (4 mg total) by mouth daily. 30 tablet 0     Assessment: Pharmacy consulted to dose warfarin in this 60 year old male admitted with AFib, Hx of PE.  Pt was on warfarin 6 mg PO daily at home but unable to determine last dose. Pt has had history of noncompliance regarding medication.     Date INR Dose 10/29 1.2 8 mg 10/30 1.2 8 mg   10/31 1.4 6 mg   CrCl =  153.1 ml/min  Goal of Therapy:  INR 2-3 Monitor platelets by anticoagulation protocol: Yes   Plan:  --INR 1.4, subtherapeutic --6 mg PO x 1 tonight  --CBC daily  Sharen Hones, PharmD, BCPS Clinical Pharmacist   12/15/2020,7:24 AM

## 2020-12-15 NOTE — Progress Notes (Signed)
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  Apparently, patient went to set his normal baseline 1 day ago but has now since been more confused and seemingly short of breath.  On EMS arrival, they report finding him hypoxic on his 4 L nasal cannula in the mid 80s requiring up to 10 L to maintain adequate SPO2.  Unable to obtain any further ROS from patient given his mental status and asking inappropriate questions.  In the ED, temperature 98.2 F, HR 66, RR 22, BP 120/58, SPO2 100% on 15 L NRB.  Patient was subsequently placed on BiPAP on ED arrival.  Sodium 131, potassium 5.5, chloride 84, CO2 41, BUN 28, glucose 1.10, glucose 117, magnesium 1.8, AST 25, ALT 9, total bilirubin 1.2.  BNP 203.7, high sensitive troponin 21.  WBC 5.9, hemoglobin 10.3, platelets 212.  INR 1.2.  COVID-19 PCR negative.  Influenza A/B PCR negative.  CT head without contrast with no acute intracranial process.  Chest x-ray with mild bibasilar subsegmental atelectasis, possible small left pleural effusion.  ABG with pH 7.50, PaCO2 50, PaO2 160 on 40% FiO2/BiPAP.   Assessment & Plan:   Principal Problem:   SOB (shortness of breath) Active Problems:   Acute on chronic respiratory failure with hypoxia and hypercapnia (HCC)   Obstructive sleep apnea   Type 2 diabetes  mellitus with other specified complication (HCC)   HTN (hypertension)   PE (pulmonary thromboembolism) (HCC)   CAD (coronary artery disease)   Hypothyroidism   Iron deficiency anemia   Alcohol abuse   Diet-controlled diabetes mellitus (HCC)   Atrial fibrillation (HCC)   Acute on chronic combined systolic and diastolic CHF (congestive heart failure) (HCC)   Hypomagnesemia   Altered mental status   Acute on chronic respiratory failure with hypoxia (HCC)   Shortness of breath   Acute respiratory failure with hypoxia (HCC)   Acute metabolic encephalopathy, POA: Resolved Patient presenting to ED from his nursing facility after being found confused, short of breath and hypoxic.  Etiology likely multifactorial with acute COPD exacerbation, diastolic heart failure exacerbation and presumed medical noncompliance with his home CPAP.  Patient is also followed by hospice outpatient. --Continue treatment as below  Acute on chronic hypoxic, hypercapnic respiratory failure, POA At baseline, patient on 3-4 L nasal cannula and CPAP.  Etiology likely multifactorial in the setting of COPD/CHF exacerbation and medical noncompliance. --Continue BiPAP nocturnally and while asleep --Treatment as below for COPD/CHF  Chronic systolic and diastolic congestive heart failure, decompensated TTE 09/23/2020 with LVEF 25-30%, LV severely decreased function with global hypokinesis, LV mildly dilated, LA/RA mildly dilated, mild MR. BNP elevated on admission.  Home regimen includes carvedilol 6.25 mg p.o. twice daily, spironolactone 12.5 mg p.o. daily, torsemide 40 mg p.o. daily, lisinopril 2.5 mg p.o. daily.  TTE with LVEF 25-30%, LV function severely decreased with global hypokinesis which is similar in appearance to recent echo August 2022. --Holding home carvedilol, spironolactone, torsemide, lisinopril given borderline hypotension --net  negative 1.4L past 24 hours and net negative 2.4 L since admission --Wt 190.1>no  weight recorded again today --Furosemide 40 mg IV q12h --Strict I's and O's and daily weights  Acute COPD exacerbation Patient was noted to have SPO2 74% on 4 L nasal cannula, initially placed on 15 L NRB followed by BiPAP. --Brovana neb BID --Pulmicort neb BID --Solu-Medrol transitioned to prednisone 40 mg PO daily today --Albuterol/duo nebs every 6 hours as needed wheezing/shortness of breath --BiPAP while sleeping/napping --Continue supplemental oxygen, maintain SPO2 > 88%; on 3-4L at baseline; currently on 4L [place]  History of paroxysmal atrial fibrillation History of DVT/PE --Amiodarone 200 mg p.o. twice daily --Warfarin, pharmacy consulted for dosing/monitoring; INR 1.4 today, subtherapeutic  Type 2 diabetes mellitus Diet controlled at baseline.  Diet controlled at baseline.  Hemoglobin A1c 6.3 on 09/04/2020, well controlled.   --SSI for coverage --CBGs qAC/HS  Essential hypertension Nonischemic cardiomyopathy Home regimen includes carvedilol 6.25 mg p.o. twice daily, spironolactone 12.5 mg p.o. daily, torsemide 40 mg p.o. daily, lisinopril 2.5 mg p.o. daily --Holding home meds for now given borderline hypotension --Continue IV furosemide as above --Hydralazine 5mg  IV q4h PRN SBP >160 or SBP >110 --Continue monitor BP closely  Hyperlipidemia: Atorvastatin 40 mg p.o. daily  Depression/anxiety: --Xanax 0.5 mg p.o. every 6 hours as needed anxiety  Hypothyroidism --Levothyroxine 75 mcg p.o. daily  Gout: --Colchicine 0.6 mg p.o. daily --Allopurinol 100 mg p.o. daily  Chronic pain syndrome --Gabapentin 300 mg p.o. twice daily --Oxycodone as needed  Obstructive sleep apnea Obesity hypoventilation syndrome --Continue nocturnal CPAP/BiPAP and while napping during the day --TOC for evaluation of dysfunctional home CPAP unit per patient  Morbid obesity Body mass index is 61.91 kg/m.  Discussed with patient needs for aggressive lifestyle changes/weight loss as this  complicates all facets of care.  Outpatient follow-up with PCP.      DVT prophylaxis: SCDs Start: 12/12/20 2126 warfarin (COUMADIN) tablet 6 mg    Code Status: DNR Family Communication: No family present at bedside this morning  Disposition Plan:  Level of care: Med-Surg Status is: Inpatient  Remains inpatient appropriate because: Continues on IV diuresis for decompensated CHF, neb treatments for acute COPD exacerbation.  Anticipate discharge back to [facility] under hospice care likely tomorrow.    Consultants:  No  Procedures:  None  Antimicrobials:  BiPAP   Subjective: Patient seen examined bedside, resting comfortably.  Sitting in bedside chair.  Eating breakfast.  No specific complaints this morning.  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  Also states "I am a [place] and would like to be independent", so this is "hard for me".  Confusion now resolved, shortness of breath also much improved.  Oxygen now titrated down to his baseline of 3 L nasal cannula.  No other questions or concerns at this time.  Denies headache, no chest pain, no abdominal pain.  No acute events overnight per nursing staff.  Objective: Vitals:   12/15/20 0048 12/15/20 0612 12/15/20 0814 12/15/20 0926  BP:  (!) 104/48 133/86   Pulse: 61 (!) 48 (!) 59 86  Resp:  18 16   Temp:  97.9 F (36.6 C) 98.4 F (36.9 C)   TempSrc:  Oral    SpO2: 97% 96% 100% 100%  Weight:      Height:        Intake/Output Summary (Last 24 hours) at 12/15/2020 1048 Last data filed at 12/15/2020 0814 Gross per 24 hour  Intake 1072 ml  Output  2930 ml  Net -1858 ml   Filed Weights   12/12/20 1622  Weight: (!) 190.1 kg    Examination:  General exam: Appears calm and comfortable, NAD, morbidly obese, appears older than stated age Respiratory system: Decreased breath sounds bilateral bases, mild wheezing/crackles, normal respiratory effort on 3 L nasal cannula Cardiovascular system: S1 & S2 heard,  RRR. No JVD, murmurs, rubs, gallops or clicks. No pedal edema. Gastrointestinal system: Abdomen is nondistended, protuberant abdomen, soft and nontender. No organomegaly or masses felt. Normal bowel sounds heard. Central nervous system: Alert and oriented. No focal neurological deficits. Extremities: Symmetric 5 x 5 power. Skin: No rashes, lesions or ulcers Psychiatry: Judgement and insight appear poor. Mood & affect appropriate.     Data Reviewed: I have personally reviewed following labs and imaging studies  CBC: Recent Labs  Lab 12/12/20 1700 12/13/20 0812  WBC 5.9 6.1  NEUTROABS 3.9  --   HGB 10.3* 11.8*  HCT 33.3* 37.2*  MCV 95.1 94.9  PLT 212 167   Basic Metabolic Panel: Recent Labs  Lab 12/12/20 1700 12/13/20 0812 12/14/20 0622 12/15/20 0555  NA 131* 132* 136 136  K 5.5* 5.0 4.9 4.5  CL 84* 89* 89* 88*  CO2 41* 34* 42* 38*  GLUCOSE 117* 141* 160* 142*  BUN 28* 30* 31* 37*  CREATININE 1.10 1.04 0.86 0.87  CALCIUM 8.8* 9.0 8.8* 9.0  MG 1.8  --  2.1 1.9   GFR: Estimated Creatinine Clearance: 151.3 mL/min (by C-G formula based on SCr of 0.87 mg/dL). Liver Function Tests: Recent Labs  Lab 12/12/20 1700  AST 25  ALT 9  ALKPHOS 56  BILITOT 1.2  PROT 6.6  ALBUMIN 3.2*   No results for input(s): LIPASE, AMYLASE in the last 168 hours. No results for input(s): AMMONIA in the last 168 hours. Coagulation Profile: Recent Labs  Lab 12/12/20 1811 12/13/20 0812 12/14/20 0622 12/15/20 0555  INR 1.2 1.2 1.2 1.4*   Cardiac Enzymes: No results for input(s): CKTOTAL, CKMB, CKMBINDEX, TROPONINI in the last 168 hours. BNP (last 3 results) No results for input(s): PROBNP in the last 8760 hours. HbA1C: Recent Labs    12/13/20 0815  HGBA1C 6.5*   CBG: Recent Labs  Lab 12/14/20 0852 12/14/20 1135 12/14/20 1755 12/14/20 2119 12/15/20 0814  GLUCAP 223* 200* 146* 198* 126*   Lipid Profile: No results for input(s): CHOL, HDL, LDLCALC, TRIG, CHOLHDL,  LDLDIRECT in the last 72 hours. Thyroid Function Tests: No results for input(s): TSH, T4TOTAL, FREET4, T3FREE, THYROIDAB in the last 72 hours. Anemia Panel: No results for input(s): VITAMINB12, FOLATE, FERRITIN, TIBC, IRON, RETICCTPCT in the last 72 hours. Sepsis Labs: No results for input(s): PROCALCITON, LATICACIDVEN in the last 168 hours.  Recent Results (from the past 240 hour(s))  Resp Panel by RT-PCR (Flu A&B, Covid) Nasopharyngeal Swab     Status: None   Collection Time: 12/12/20  5:00 PM   Specimen: Nasopharyngeal Swab; Nasopharyngeal(NP) swabs in vial transport medium  Result Value Ref Range Status   SARS Coronavirus 2 by RT PCR NEGATIVE NEGATIVE Final    Comment: (NOTE) SARS-CoV-2 target nucleic acids are NOT DETECTED.  The SARS-CoV-2 RNA is generally detectable in upper respiratory specimens during the acute phase of infection. The lowest concentration of SARS-CoV-2 viral copies this assay can detect is 138 copies/mL. A negative result does not preclude SARS-Cov-2 infection and should not be used as the sole basis for treatment or other patient management decisions. A negative result may occur  with  improper specimen collection/handling, submission of specimen other than nasopharyngeal swab, presence of viral mutation(s) within the areas targeted by this assay, and inadequate number of viral copies(<138 copies/mL). A negative result must be combined with clinical observations, patient history, and epidemiological information. The expected result is Negative.  Fact Sheet for Patients:  BloggerCourse.com  Fact Sheet for Healthcare Providers:  SeriousBroker.it  This test is no t yet approved or cleared by the [place] FDA and  has been authorized for detection and/or diagnosis of SARS-CoV-2 by FDA under an Emergency Use Authorization (EUA). This EUA will remain  in effect (meaning this test can be used) for the  duration of the COVID-19 declaration under Section 564(b)(1) of the Act, 21 U.S.C.section 360bbb-3(b)(1), unless the authorization is terminated  or revoked sooner.       Influenza A by PCR NEGATIVE NEGATIVE Final   Influenza B by PCR NEGATIVE NEGATIVE Final    Comment: (NOTE) The Xpert Xpress SARS-CoV-2/FLU/RSV plus assay is intended as an aid in the diagnosis of influenza from Nasopharyngeal swab specimens and should not be used as a sole basis for treatment. Nasal washings and aspirates are unacceptable for Xpert Xpress SARS-CoV-2/FLU/RSV testing.  Fact Sheet for Patients: BloggerCourse.com  Fact Sheet for Healthcare Providers: SeriousBroker.it  This test is not yet approved or cleared by the [place] FDA and has been authorized for detection and/or diagnosis of SARS-CoV-2 by FDA under an Emergency Use Authorization (EUA). This EUA will remain in effect (meaning this test can be used) for the duration of the COVID-19 declaration under Section 564(b)(1) of the Act, 21 U.S.C. section 360bbb-3(b)(1), unless the authorization is terminated or revoked.  Performed at [facility], [address], [place], [place] [address]          Radiology Studies: No results found.      Scheduled Meds:  allopurinol  100 mg Oral Daily   amiodarone  200 mg Oral BID   arformoterol  15 mcg Nebulization BID   aspirin EC  81 mg Oral Daily   atorvastatin  40 mg Oral Daily   bisacodyl  10 mg Rectal Once   budesonide (PULMICORT) nebulizer solution  0.5 mg Nebulization BID   colchicine  0.6 mg Oral Daily   docusate sodium  100 mg Oral BID   fluticasone  2 spray Each Nare Daily   folic acid  1 mg Oral Daily   furosemide  40 mg Intravenous BID   gabapentin  300 mg Oral BID   insulin aspart  0-5 Units Subcutaneous QHS   insulin aspart  0-6 Units Subcutaneous TID WC   levothyroxine  75 mcg Oral Q0600   loratadine  10 mg  Oral Daily   multivitamin with minerals  1 tablet Oral Daily   predniSONE  40 mg Oral Q breakfast   Ensure Max Protein  11 oz Oral BID   senna-docusate  1 tablet Oral BID   sodium chloride flush  3 mL Intravenous Q12H   warfarin  6 mg Oral ONCE-1600   Warfarin - Pharmacist Dosing Inpatient   Does not apply q1600   Continuous Infusions:   LOS: 3 days    Time spent: 38 minutes spent on chart review, discussion with nursing staff, consultants, updating family and interview/physical exam; more than 50% of that time was spent in counseling and/or coordination of care.    Alvira Philips [place], DO [facility] Available via Epic secure chat 7am-7pm After these hours, please refer to  coverage provider listed on [email or web address] 12/15/2020, 10:48 AM

## 2020-12-15 NOTE — Consult Note (Signed)
   Heart Failure Nurse Navigator Note  HFrEF 25-30%.  Mild left ventricular hypertrophy of the basal segment.  Left ventricle demonstrates global hypokinesis.  Normal right ventricular systolic function.  He presented to the emergency room from [facility] for complaints of shortness of breath and confusion.  Comorbidities:  Arthritis Asthma COPD Depression GERD Hypertension Morbid obesity Persistent atrial fibrillation Obstructive sleep apnea  Medications:  Amiodarone 200 mg 2 times a day Eliquis 5 mg 2 times a day Aspirin 81 mg daily Atorvastatin 40 mg daily Furosemide 40 mg IV 2 times a day  Labs: Sodium 136, potassium 4.5, chloride 88, CO2 38, BUN 37, creatinine 0.87, magnesium 1.9, Weight is 190.1 kg Blood pressure 120/72   Initial meeting with patient on this hospitalization.  He was sitting up in the chair, states he still continues to have back and neck pain which was his greatest concern.  He states at the facility that he has a scale and he weighs himself daily but feels that his needs to be recalibrated as it can vary as much is 50 pounds.  He also states it is very hard to step on.  He admits to using salt on some of his food says especially corn and hamburger.  Discussed that it would be better for him since he wants to stay out of the hospital but states he is not ready to relinquish that yet.   When discussing the amount of liquids he drinks during the day he was very elusive.  Discussed the importance of sticking with no more than 64 ounces.  Discussed with Clarisa Kindred the situation with his scale.  Will continue to follow along.  Tresa Endo RN [facility]
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
## 2020-12-15 NOTE — TOC Progression Note (Signed)
Transition of Care [facility]) - Progression Note    Patient Details  Name: [name] [name] MRN: [id number] Date of Birth: October 03, 1960  Transition of Care [facility]) CM/SW Contact  Bartlett Cellar, RN Phone Number: 12/15/2020, 1:32 PM  Clinical Narrative:    Spoke to leslie @ [facility] who was calling to check on patient and ensure patient was still in agreeance to return to [facility]. No issues noted and Verlon Au reports she will also follow up with patient once he gets back to [facility].         Expected Discharge Plan and Services                                                 Social Determinants of Health (SDOH) Interventions    Readmission Risk Interventions Readmission Risk Prevention Plan 01/11/2020 12/24/2019 01/25/2019  Transportation Screening Complete Complete Complete  PCP or Specialist Appt within 3-5 Days - - -  HRI or Home Care Consult - - -  Social Work Consult for Recovery Care Planning/Counseling - - -  Palliative Care Screening - - -  Medication Review Oceanographer) Complete Complete Complete  PCP or Specialist appointment within 3-5 days of discharge Complete Complete Complete  HRI or Home Care Consult - - Complete  SW Recovery Care/Counseling Consult Complete Complete Complete  Palliative Care Screening Complete Not Applicable Not Applicable  Skilled Nursing Facility Not Applicable Not Applicable Not Applicable  Some recent data might be hidden

## 2020-12-15 NOTE — Progress Notes (Signed)
PT Cancellation Note  Patient Details Name: [name] [name] MRN: [id number] DOB: [birth year]   Cancelled Treatment:    Reason Eval/Treat Not Completed: Other (comment). Pt up in chair, denied further mobility at this time, content to continue to sit up in recliner longer.  PT to re-attempt as able.  Olga Coaster PT, DPT 2:37 PM,12/15/20
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
## 2020-12-15 NOTE — Evaluation (Addendum)
Occupational Therapy Evaluation Patient Details Name: [name] [name] MRN: [id number] DOB: 1960-09-08 Today's Date: 12/15/2020   History of Present Illness Pt is a 60 y/o M admitted on 12/12/20 from [place] after being found confused, SOB, & hypoxic. Pt is being treated Acute metabolic encephalopathy & Acute on chronic hypoxic, hypercapnic respiratory failure. Pt initially required bipap but is now on nasal cannula. PMH: paroxysmal a-fib, PE, DVT on warfarin, iron deficiency anemia, DM2, HTN, gout, OSA on noctural CPAP, chronic hypoxic respiratory failure/COPD on 3-4L, obesity hypoventilation syndrome, nonischemic cardiomyopathy, TBI, chronic pain disorder, HLD, depression, anxiety, hypothyroidism, alcohol abuse   Clinical Impression   Chart reviewed, pt greeted in bed agreeable to OT evaluation with multi modal cueing. Pt is oriented to self, place, situation, month. Pt reports he previously resided in [place] with hopeful plan to return to [place]. Pt performs supine>sit with SUP with HOB raised, STS with SUP, SPT to bedside chair with RW with CGA. Pt with noted tremor in B hands, affecting package/container management. Pt is resistant to education re: energy conservation, ADL task completion, role of OT, however accepts information. Pt declines further mobility and bathing on this date despite multi modal cueing requesting to eat his breakfast. VSS throughout with SpO2 95-100 on 3L with mobility. Pt presents with deficits in endurance, strength, BUE function, affecting safe and efficient ADL completion. Pt would benefit from  to STR to address functional deficits as pt reports he was previously ambulating to bathroom prior to acute illness. Pt is left in bedside chair, NAD, all needs met. RN aware of pt status. OT will continue to follow while admitted.      Recommendations for follow up therapy are one component of a multi-disciplinary discharge planning process, led by the attending  physician.  Recommendations may be updated based on patient status, additional functional criteria and insurance authorization.   Follow Up Recommendations  Skilled nursing-short term rehab (<3 hours/day)    Assistance Recommended at Discharge Frequent or constant Supervision/Assistance  Functional Status Assessment  Patient has had a recent decline in their functional status and demonstrates the ability to make significant improvements in function in a reasonable and predictable amount of time.  Equipment Recommendations  BSC    Recommendations for Other Services       Precautions / Restrictions Precautions Precautions: Fall Restrictions Weight Bearing Restrictions: No      Mobility Bed Mobility Overal bed mobility: Needs Assistance Bed Mobility: Sit to Supine     Supine to sit: Min guard;HOB elevated          Transfers Overall transfer level: Needs assistance Equipment used: Rolling walker (2 wheels) Transfers: Sit to/from [employer] Transfers   Stand pivot transfers: Min guard                Balance Overall balance assessment: Needs assistance Sitting-balance support: Feet supported;Bilateral upper extremity supported Sitting balance-Leahy Scale: Good     Standing balance support: Bilateral upper extremity supported;During functional activity Standing balance-Leahy Scale: Fair                             ADL either performed or assessed with clinical judgement   ADL Overall ADL's : Needs assistance/impaired Eating/Feeding: Set up Eating/Feeding Details (indicate cue type and reason): vcs for package/container management Grooming: Minimal assistance Grooming Details (indicate cue type and reason): in seated, step by step vcs, poor hygiene  Upper Body Dressing : Minimal assistance Upper Body Dressing Details (indicate cue type and reason): simulated Lower Body Dressing: Maximal assistance               Functional  mobility during ADLs: Min guard;Rolling walker (2 wheels) General ADL Comments: pt with poor hygiene, refused bathing tasks despite multi modal cueing; anticipate step by step vcs required for ADLs for thoroughness      Pertinent Vitals/Pain Pain Assessment: 0-10 Pain Score: 8  Pain Location: headache, RN notified, agreeable to evaluation Pain Descriptors / Indicators: Dull Pain Intervention(s): Limited activity within patient's tolerance;Monitored during session     Hand Dominance Right   Extremity/Trunk Assessment Upper Extremity Assessment Upper Extremity Assessment: Generalized weakness;RUE deficits/detail;LUE deficits/detail RUE Deficits / Details: baseline RUE deficits- AROM R shoulder less than 50 degrees 2* pain, AROM elbow, wrist appear WFL; pain with attempted AROM, pt declined PROM due to pain; tremors noted with package/container management RUE: Unable to fully assess due to pain RUE Sensation: WNL RUE Coordination: decreased fine motor LUE Deficits / Details: generalized weakness;tremors noted with package/container management LUE Sensation: WNL LUE Coordination: decreased fine motor   Lower Extremity Assessment Lower Extremity Assessment: Generalized weakness       Communication Communication Communication: No difficulties   Cognition Arousal/Alertness: Awake/alert Behavior During Therapy: WFL for tasks assessed/performed Overall Cognitive Status: No family/caregiver present to determine baseline cognitive functioning                                 General Comments: Pt alert and oriented to self, place, situation; Oriented to October- not oriened to day, year     General Comments       Exercises Other Exercises Other Exercises: education re: role of OT, ADL completion, orientation, cognition, safety, mobility; pt requires significant cueing for engagement in task         Home Living                                    Additional Comments: Pt reports he is a STR residence at [facility]      Prior Functioning/Environment               Mobility Comments: Reports ambulatory short distances with RW, decrease in function over ~1 week due to SOB, confusion ADLs Comments: currently requires assistance with all ADL, poor hygiene noted upon eval        OT Problem List: Decreased strength;Impaired balance (sitting and/or standing);Decreased activity tolerance;Decreased coordination;Decreased knowledge of use of DME or AE;Impaired UE functional use      OT Treatment/Interventions: Self-care/ADL training;DME and/or AE instruction;Therapeutic activities;Therapeutic exercise;Modalities;Energy conservation;Patient/family education    OT Goals(Current goals can be found in the care plan section) Acute Rehab OT Goals Patient Stated Goal: to get stronger OT Goal Formulation: With patient Time For Goal Achievement: 12/29/20 Potential to Achieve Goals: Good ADL Goals Pt Will Perform Grooming: with supervision;standing Pt Will Perform Upper Body Bathing: sitting;with set-up Pt Will Perform Lower Body Bathing: with set-up;sitting/lateral leans Pt Will Perform Toileting - Clothing Manipulation and hygiene: with supervision  OT Frequency: Min 1X/week   Barriers to D/C:            Co-evaluation              AM-PAC OT "6 Clicks" Daily Activity  Outcome Measure Help from another person eating meals?: None Help from another person taking care of personal grooming?: A Lot Help from another person toileting, which includes using toliet, bedpan, or urinal?: A Little Help from another person bathing (including washing, rinsing, drying)?: A Lot Help from another person to put on and taking off regular upper body clothing?: A Little Help from another person to put on and taking off regular lower body clothing?: A Lot 6 Click Score: 16   End of Session Equipment Utilized During Treatment:  Gait belt;Rolling walker (2 wheels) Nurse Communication: Mobility status  Activity Tolerance: Patient limited by fatigue Patient left: in chair;with call bell/phone within reach;with chair alarm set  OT Visit Diagnosis: Unsteadiness on feet (R26.81);Other abnormalities of gait and mobility (R26.89)                Time: 6503-5465 OT Time Calculation (min): 22 min Charges:  OT General Charges $OT Visit: 1 Visit OT Evaluation $OT Eval Low Complexity: 1 Low OT Treatments $Self Care/Home Management : 8-22 mins  Shanon Payor, OTD OTR/L  12/15/20, 9:42 AM

## 2020-12-16 LAB — BASIC METABOLIC PANEL
Anion gap: 6 (ref 5–15)
BUN: 37 mg/dL — ABNORMAL HIGH (ref 6–20)
CO2: 41 mmol/L — ABNORMAL HIGH (ref 22–32)
Calcium: 8.9 mg/dL (ref 8.9–10.3)
Chloride: 88 mmol/L — ABNORMAL LOW (ref 98–111)
Creatinine, Ser: 0.87 mg/dL (ref 0.61–1.24)
GFR, Estimated: >60 mL/min (ref >60)
Glucose, Bld: 137 mg/dL — ABNORMAL HIGH (ref 70–99)
Potassium: 4.4 mmol/L (ref 3.5–5.1)
Sodium: 135 mmol/L (ref 135–145)

## 2020-12-16 LAB — GLUCOSE, CAPILLARY
Glucose-Capillary: 111 mg/dL — ABNORMAL HIGH (ref 70–99)
Glucose-Capillary: 162 mg/dL — ABNORMAL HIGH (ref 70–99)
Glucose-Capillary: 150 mg/dL — ABNORMAL HIGH (ref 70–99)

## 2020-12-16 LAB — RESP PANEL BY RT-PCR (FLU A&B, COVID) ARPGX2
Influenza A by PCR: NEGATIVE
Influenza B by PCR: NEGATIVE
SARS Coronavirus 2 by RT PCR: NEGATIVE

## 2020-12-16 LAB — MAGNESIUM: Magnesium: 1.8 mg/dL (ref 1.7–2.4)

## 2020-12-16 MED ORDER — SPIRONOLACTONE 25 MG PO TABS
12.5000 mg | ORAL_TABLET | Freq: Every day | ORAL | Status: DC
  Administered 2020-12-16: 09:00:00 12.5 mg via ORAL
  Filled 2020-12-16: qty 0.5
  Filled 2020-12-16: qty 1

## 2020-12-16 MED ORDER — PREDNISONE 20 MG PO TABS: 40.0000 mg | ORAL_TABLET | Freq: Every day | ORAL | 0 refills | Status: AC

## 2020-12-16 MED ORDER — TRAZODONE HCL 100 MG PO TABS: 100.0000 mg | ORAL_TABLET | Freq: Every day | ORAL | 0 refills | Status: DC

## 2020-12-16 MED ORDER — ALPRAZOLAM 0.5 MG PO TABS: 0.5000 mg | ORAL_TABLET | Freq: Four times a day (QID) | ORAL | 0 refills | Status: DC | PRN

## 2020-12-16 MED ORDER — OXYCODONE-ACETAMINOPHEN 10-325 MG PO TABS: 1.0000 | ORAL_TABLET | Freq: Four times a day (QID) | ORAL | 0 refills | Status: AC | PRN

## 2020-12-16 MED ORDER — TORSEMIDE 20 MG PO TABS
40.0000 mg | ORAL_TABLET | Freq: Every day | ORAL | Status: DC
  Administered 2020-12-16: 09:00:00 40 mg via ORAL
  Filled 2020-12-16: qty 2

## 2020-12-16 NOTE — Discharge Summary (Signed)
Physician Discharge Summary  [name] [name] VHQ:[id number] DOB: [birth year] DOA: 12/12/2020  PCP: [name], DO  Admit date: 12/12/2020 Discharge date: 12/16/2020  Admitted From: [facility] Disposition: [facility]  Recommendations for Outpatient Follow-up:  Follow up with PCP in 1-2 weeks Continue prednisone to complete course for COPD exacerbation Continue to monitor her fluid intake, restrict to 1800 mL/day Patient needs to continue compliance with home CPAP as this is leading etiology as well as his fluid indiscretion to his hospitalization Please obtain BMP in one week to assess renal function Monitor daily weights; weight on discharge 182.8 kg Resume hospice services on discharge  Discharge Condition: Stable CODE STATUS: DNR Diet recommendation: Heart healthy diet, 1800 mL fluid restriction  History of present illness:  [name] [name] is a 60 year old male with past medical history significant for paroxysmal atrial fibrillation, 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  Apparently, patient went to set his normal baseline 1 day ago but has now since been more confused and seemingly short of breath.  On EMS arrival, they report finding him hypoxic on his 4 L nasal cannula in the mid 80s requiring up to 10 L to maintain adequate SPO2.  Unable to obtain any further ROS from patient given his mental status and asking inappropriate questions.   In the ED, temperature 98.2 F, HR 66, RR 22, BP 120/58, SPO2 100% on 15 L NRB.  Patient was  subsequently placed on BiPAP on ED arrival.  Sodium 131, potassium 5.5, chloride 84, CO2 41, BUN 28, glucose 1.10, glucose 117, magnesium 1.8, AST 25, ALT 9, total bilirubin 1.2.  BNP 203.7, high sensitive troponin 21.  WBC 5.9, hemoglobin 10.3, platelets 212.  INR 1.2.  COVID-19 PCR negative.  Influenza A/B PCR negative.  CT head without contrast with no acute intracranial process.  Chest x-ray with mild bibasilar subsegmental atelectasis, possible small left pleural effusion.  ABG with pH 7.50, PaCO2 50, PaO2 160 on 40% FiO2/BiPAP.  Hospital course:  Acute metabolic encephalopathy, POA: Resolved Patient presenting to ED from his nursing facility after being found confused, short of breath and hypoxic.  Etiology likely multifactorial with acute COPD exacerbation, diastolic heart failure exacerbation and presumed medical noncompliance with his home CPAP.  Patient is also followed by hospice outpatient.  With treatment and correction of his congestive heart failure exacerbation and COPD exacerbation his mental status has improved back to his normal baseline.  If patient continues to be noncompliant in terms of his home CPAP use, dietary indiscretions, likely will have recurrent hospitalizations from medical noncompliance.   Acute on chronic hypoxic, hypercapnic respiratory failure, POA At baseline, patient on 3-4 L nasal cannula and CPAP.  Etiology likely multifactorial in the setting of COPD/CHF exacerbation and medical noncompliance.  Patient was initially started on continuous BiPAP which was weaned off to nocturnally or while sleeping.  Patient now back on his normal 3 L nasal cannula with SPO2 95%.   Chronic systolic and diastolic congestive heart failure, decompensated TTE 09/23/2020 with LVEF 25-30%, LV severely decreased function with global hypokinesis, LV mildly dilated, LA/RA mildly dilated, mild MR. BNP elevated on admission.  Home regimen includes carvedilol 6.25 mg p.o. twice  daily,  spironolactone 12.5 mg p.o. daily, torsemide 40 mg p.o. daily, lisinopril 2.5 mg p.o. daily.  TTE with LVEF 25-30%, LV function severely decreased with global hypokinesis which is similar in appearance to recent echo August 2022.  Patient was started on IV furosemide 40 mg twice daily with good urine output.  Patient's weight on admission 190.1 kg and trended down to 182.8 kg on discharge.  Will resume home carvedilol, spironolactone, torsemide, lisinopril.  Recommend monitoring of daily weights.  Encourage to maintain low-salt with fluid restricted diet of 1800 mL.  Etiology of his hospitalization from medical noncompliance and dietary indiscretions.   Acute COPD exacerbation Patient was noted to have SPO2 74% on 4 L nasal cannula, initially placed on 15 L NRB followed by continuous BiPAP.  Patient received Brovana/Pulmicort nebulized treatments twice daily and received IV Solu-Medrol which is now transition to prednisone.  We will continue course of prednisone on discharge.  Continue home CPAP.  On 3 L nasal cannula which is his baseline.   History of paroxysmal atrial fibrillation History of DVT/PE Amiodarone 200 mg p.o. twice daily; started on Eliquis for anticoagulation.   Type 2 diabetes mellitus Diet controlled at baseline.  Diet controlled at baseline.  Hemoglobin A1c 6.3 on 09/04/2020, well controlled.     Essential hypertension Nonischemic cardiomyopathy Home regimen includes carvedilol 6.25 mg p.o. twice daily, spironolactone 12.5 mg p.o. daily, torsemide 40 mg p.o. daily, lisinopril 2.5 mg p.o. daily  Hyperlipidemia: Atorvastatin 40 mg p.o. daily   Depression/anxiety: Xanax 0.5 mg p.o. every 6 hours as needed anxiety   Hypothyroidism Levothyroxine 75 mcg p.o. daily   Gout: Colchicine 0.6 mg p.o. daily, Allopurinol 100 mg p.o. daily   Chronic pain syndrome Gabapentin 300 mg p.o. twice daily, Oxycodone as needed   Obstructive sleep apnea Obesity hypoventilation  syndrome --Continue nocturnal CPAP and while napping during the day   Morbid obesity Body mass index is 61.91 kg/m.  Discussed with patient needs for aggressive lifestyle changes/weight loss as this complicates all facets of care.  Outpatient follow-up with PCP.   Discharge Diagnoses:  Principal Problem:   SOB (shortness of breath) Active Problems:   Acute on chronic respiratory failure with hypoxia and hypercapnia (HCC)   Obstructive sleep apnea   Type 2 diabetes mellitus with other specified complication (HCC)   HTN (hypertension)   PE (pulmonary thromboembolism) (HCC)   CAD (coronary artery disease)   Hypothyroidism   Iron deficiency anemia   Alcohol abuse   Diet-controlled diabetes mellitus (HCC)   Atrial fibrillation (HCC)   Acute on chronic combined systolic and diastolic CHF (congestive heart failure) (HCC)   Hypomagnesemia   Altered mental status   Acute on chronic respiratory failure with hypoxia (HCC)   Shortness of breath   Acute respiratory failure with hypoxia [facility])    Discharge Instructions  Discharge Instructions     Diet - low sodium heart healthy   Complete by: As directed    Increase activity slowly   Complete by: As directed       Allergies as of 12/16/2020   No Known Allergies      Medication List     STOP taking these medications    warfarin 4 MG tablet Commonly known as: COUMADIN   warfarin 6 MG tablet Commonly known as: COUMADIN       TAKE these medications    acetaminophen 325 MG tablet Commonly known as: TYLENOL Take 2 tablets (650 mg total) by mouth every 6 (six)  hours as needed for mild pain, fever or headache.   albuterol 108 (90 Base) MCG/ACT inhaler Commonly known as: VENTOLIN HFA Inhale 2 puffs into the lungs every 6 (six) hours as needed for wheezing or shortness of breath.   allopurinol 100 MG tablet Commonly known as: ZYLOPRIM Take 1 tablet (100 mg total) by mouth daily.   ALPRAZolam 0.5 MG tablet Commonly  known as: XANAX Take 1 tablet (0.5 mg total) by mouth every 6 (six) hours as needed for anxiety.   amiodarone 200 MG tablet Commonly known as: PACERONE Take 200 mg by mouth 2 (two) times daily.   apixaban 5 MG Tabs tablet Commonly known as: ELIQUIS Take 5 mg by mouth in the morning and at bedtime. Was supposed to start 10/28 before hospital admission   aspirin EC 81 MG tablet Take 81 mg by mouth daily. Swallow whole.   atorvastatin 40 MG tablet Commonly known as: LIPITOR Take 1 tablet (40 mg total) by mouth daily.   calamine lotion Apply topically 2 (two) times daily.   carvedilol 6.25 MG tablet Commonly known as: COREG Take 1 tablet (6.25 mg total) by mouth 2 (two) times daily with a meal.   cetirizine 10 MG tablet Commonly known as: ZYRTEC Take 10 mg by mouth daily.   colchicine 0.6 MG tablet Take 1 tablet (0.6 mg total) by mouth daily.   feeding supplement (PRO-STAT SUGAR FREE 64) Liqd Take 30 mLs by mouth 2 (two) times daily.   fluticasone 50 MCG/ACT nasal spray Commonly known as: FLONASE Place 1 spray into both nostrils daily.   folic acid 1 MG tablet Commonly known as: FOLVITE Take 1 tablet (1 mg total) by mouth daily.   gabapentin 300 MG capsule Commonly known as: NEURONTIN Take 300 mg by mouth 2 (two) times daily.   ipratropium-albuterol 0.5-2.5 (3) MG/3ML Soln Commonly known as: DUONEB Take 3 mLs by nebulization every 6 (six) hours as needed.   levothyroxine 75 MCG tablet Commonly known as: SYNTHROID Take 1 tablet (75 mcg total) by mouth daily before breakfast.   lisinopril 2.5 MG tablet Commonly known as: ZESTRIL Take 1 tablet (2.5 mg total) by mouth daily.   loperamide 2 MG capsule Commonly known as: IMODIUM Take 1 capsule (2 mg total) by mouth 4 (four) times daily as needed for diarrhea or loose stools.   multivitamin with minerals Tabs tablet Take 1 tablet by mouth daily.   nitroGLYCERIN 0.4 MG SL tablet Commonly known as:  NITROSTAT Place 1 tablet under tongue every 5 minutes as needed for chest pain. (No more than 3 doses within 15 minutes)   nystatin powder Commonly known as: MYCOSTATIN/NYSTOP Apply 1 application topically 2 (two) times daily.   oxyCODONE-acetaminophen 10-325 MG tablet Commonly known as: PERCOCET Take 1 tablet by mouth every 6 (six) hours as needed for pain.   predniSONE 20 MG tablet Commonly known as: DELTASONE Take 2 tablets (40 mg total) by mouth daily with breakfast for 3 days. Start taking on: December 17, 2020   spironolactone 25 MG tablet Commonly known as: ALDACTONE Take 0.5 tablets (12.5 mg total) by mouth daily.   torsemide 20 MG tablet Commonly known as: DEMADEX Take 2 tablets (40 mg total) by mouth daily.   traZODone 100 MG tablet Commonly known as: DESYREL Take 1 tablet (100 mg total) by mouth at bedtime. What changed:  medication strength how much to take   Trelegy Ellipta 100-62.5-25 MCG/ACT Aepb Generic drug: Fluticasone-Umeclidin-Vilant Inhale 1 Inhaler into the lungs daily.  Follow-up Information     [name], DO. Schedule an appointment as soon as possible for a visit in 1 week(s).   Specialty: Family Medicine Contact information: [address] [place] [address] [phone]                No Known Allergies  Consultations: None   Procedures/Studies: CT HEAD WO CONTRAST ( )  Result Date: 12/12/2020 CLINICAL DATA:  Altered level of consciousness, shortness of breath EXAM: CT HEAD WITHOUT CONTRAST TECHNIQUE: Contiguous axial images were obtained from the base of the skull through the vertex without intravenous contrast. COMPARISON:  07/10/2019 FINDINGS: Brain: No acute infarct or hemorrhage. Lateral ventricles and midline structures are unremarkable. No acute extra-axial fluid collections. No mass effect. Vascular: No hyperdense vessel or unexpected calcification. Skull: Normal. Negative for fracture or focal  lesion. Sinuses/Orbits: Left mastoid effusion. Paranasal sinuses are unremarkable. Other: None. IMPRESSION: 1. No acute intracranial process. 2. Left mastoid effusion. Electronically Signed   By: Sharlet Salina M.D.   On: 12/12/2020 17:55   DG Chest Portable 1 View  Result Date: 12/12/2020 CLINICAL DATA:  Shortness of breath. EXAM: PORTABLE CHEST 1 VIEW COMPARISON:  November 03, 2020. FINDINGS: Stable cardiomegaly. Mild bibasilar subsegmental atelectasis is noted. Small left pleural effusion may be present. Bony thorax is unremarkable. IMPRESSION: Mild bibasilar subsegmental atelectasis. Possible small left pleural effusion. Electronically Signed   By: Lupita Raider M.D.   On: 12/12/2020 16:28   ECHOCARDIOGRAM COMPLETE  Result Date: 12/13/2020    ECHOCARDIOGRAM REPORT   Patient Name:   [name] [name] Date of Exam: 12/13/2020 Medical Rec #:  [id number]              Height:       69.0 in Accession #:    [id number]             Weight:       419.2 lb Date of Birth:  09-22-1960              BSA:          2.830 m Patient Age:    60 years               BP:           136/54 mmHg Patient Gender: M                      HR:           63 bpm. Exam Location:  [facility] Procedure: 2D Echo, Cardiac Doppler, Color Doppler and Intracardiac            Opacification Agent Indications:     CHF-Acute Systolic 428.21 / I50.21  History:         Patient has prior history of Echocardiogram examinations. CHF;                  Risk Factors:Hypertension. MI.  Sonographer:     Neysa Bonito Roar Referring Phys:  [id number] Eliezer Mccoy PATEL Diagnosing Phys: Chilton Si MD IMPRESSIONS  1. Left ventricular ejection fraction, by estimation, is 25 to 30%. The left ventricle has severely decreased function. The left ventricle demonstrates global hypokinesis. The left ventricular internal cavity size was moderately dilated. There is mild left ventricular hypertrophy of the basal-septal segment. Left ventricular diastolic parameters are  indeterminate.  2. Right ventricular systolic function is normal. The right ventricular size is normal.  3. The mitral valve is normal in  structure. No evidence of mitral valve regurgitation. No evidence of mitral stenosis.  4. The aortic valve is normal in structure. Aortic valve regurgitation is not visualized. No aortic stenosis is present. Comparison(s): Essentially unchanged from 09/2020. FINDINGS  Left Ventricle: Left ventricular ejection fraction, by estimation, is 25 to 30%. The left ventricle has severely decreased function. The left ventricle demonstrates global hypokinesis. The left ventricular internal cavity size was moderately dilated. There is mild left ventricular hypertrophy of the basal-septal segment. Left ventricular diastolic parameters are indeterminate. Right Ventricle: The right ventricular size is normal. No increase in right ventricular wall thickness. Right ventricular systolic function is normal. Left Atrium: Left atrial size was normal in size. Right Atrium: Right atrial size was normal in size. Pericardium: There is no evidence of pericardial effusion. Mitral Valve: The mitral valve is normal in structure. No evidence of mitral valve regurgitation. No evidence of mitral valve stenosis. Tricuspid Valve: The tricuspid valve is normal in structure. Tricuspid valve regurgitation is trivial. No evidence of tricuspid stenosis. Aortic Valve: The aortic valve is normal in structure. Aortic valve regurgitation is not visualized. No aortic stenosis is present. Aortic valve peak gradient measures 9.9 mmHg. Pulmonic Valve: The pulmonic valve was normal in structure. Pulmonic valve regurgitation is not visualized. No evidence of pulmonic stenosis. Aorta: The aortic root is normal in size and structure. Venous: The inferior vena cava was not well visualized. IAS/Shunts: No atrial level shunt detected by color flow Doppler. Additional Comments: Study is limited by poor endomyocardial border definition  despite using Definity contrast.  LEFT VENTRICLE PLAX 2D LVIDd:         6.40 cm   Diastology LVIDs:         5.50 cm   LV e' medial:    12.70 cm/s LV PW:         1.00 cm   LV E/e' medial:  8.6 LV IVS:        1.10 cm   LV e' lateral:   13.50 cm/s LVOT diam:     1.90 cm   LV E/e' lateral: 8.1 LVOT Area:     2.84 cm  RIGHT VENTRICLE RV Mid diam:    3.50 cm RV S prime:     9.37 cm/s TAPSE (M-mode): 1.8 cm LEFT ATRIUM         Index LA diam:    5.60 cm 1.98 cm/m  AORTIC VALVE                 PULMONIC VALVE AV Area (Vmax): 1.57 cm     PV Vmax:        1.03 m/s AV Vmax:        157.00 cm/s  PV Peak grad:   4.2 mmHg AV Peak Grad:   9.9 mmHg     RVOT Peak grad: 2 mmHg LVOT Vmax:      86.80 cm/s  AORTA Ao Root diam: 3.40 cm MITRAL VALVE                TRICUSPID VALVE MV Area (PHT): 5.02 cm     TR Peak grad:   33.6 mmHg MV Decel Time: 151 msec     TR Vmax:        290.00 cm/s MV E velocity: 109.00 cm/s                             SHUNTS  Systemic Diam: 1.90 cm Chilton Si MD Electronically signed by Chilton Si MD Signature Date/Time: 12/13/2020/2:54:05 PM    Final      Subjective: Patient seen examined bedside, resting comfortably.  No specific complaints this morning.  Sitting in bedside chair.  Discharging back to [facility] today.  Once again discussed need for compliance with his home CPAP and to avoid dietary indiscretions and to maintain fluid restriction of 1800 mL/day.  States "I do not use much salt".  No other questions or concerns at this time.  Denies headache, no dizziness, no chest pain, no palpitations, no shortness of breath more than his typical baseline, no abdominal pain, no fever/chills/night sweats, no nausea/vomiting/diarrhea, no weakness, no fatigue, no paresthesias.  No acute events overnight per nursing staff.  Discharge Exam: Vitals:   12/16/20 0527 12/16/20 0730  BP: 137/65 121/67  Pulse: 66 65  Resp: 18 20  Temp: 98.2 F (36.8 C)   SpO2:  100% 98%   Vitals:   12/16/20 0057 12/16/20 0500 12/16/20 0527 12/16/20 0730  BP: 134/67  137/65 121/67  Pulse: 72  66 65  Resp: Temp: 98.5 F (36.9 C)  98.2 F (36.8 C)   TempSrc: Oral  Oral   SpO2: 100%  100% 98%  Weight:  (!) 182.8 kg    Height:        General: Pt is alert, awake, not in acute distress, morbidly obese Cardiovascular: RRR, S1/S2 +, no rubs, no gallops Respiratory: CTA bilaterally, no wheezing, no rhonchi, on 3 L nasal cannula which is his baseline Abdominal: Soft, NT, ND, bowel sounds + Extremities: 1+ pitting edema bilateral lower extremities to mid shin, no cyanosis    The results of significant diagnostics from this hospitalization (including imaging, microbiology, ancillary and laboratory) are listed below for reference.     Microbiology: Recent Results (from the past 240 hour(s))  Resp Panel by RT-PCR (Flu A&B, Covid) Nasopharyngeal Swab     Status: None   Collection Time: 12/12/20  5:00 PM   Specimen: Nasopharyngeal Swab; Nasopharyngeal(NP) swabs in vial transport medium  Result Value Ref Range Status   SARS Coronavirus 2 by RT PCR NEGATIVE NEGATIVE Final    Comment: (NOTE) SARS-CoV-2 target nucleic acids are NOT DETECTED.  The SARS-CoV-2 RNA is generally detectable in upper respiratory specimens during the acute phase of infection. The lowest concentration of SARS-CoV-2 viral copies this assay can detect is 138 copies/mL. A negative result does not preclude SARS-Cov-2 infection and should not be used as the sole basis for treatment or other patient management decisions. A negative result may occur with  improper specimen collection/handling, submission of specimen other than nasopharyngeal swab, presence of viral mutation(s) within the areas targeted by this assay, and inadequate number of viral copies(<138 copies/mL). A negative result must be combined with clinical observations, patient history, and epidemiological information.  The expected result is Negative.  Fact Sheet for Patients:  BloggerCourse.com  Fact Sheet for Healthcare Providers:  SeriousBroker.it  This test is no t yet approved or cleared by the [place] FDA and  has been authorized for detection and/or diagnosis of SARS-CoV-2 by FDA under an Emergency Use Authorization (EUA). This EUA will remain  in effect (meaning this test can be used) for the duration of the COVID-19 declaration under Section 564(b)(1) of the Act, 21 U.S.C.section 360bbb-3(b)(1), unless the authorization is terminated  or revoked sooner.       Influenza A by PCR NEGATIVE NEGATIVE  Final   Influenza B by PCR NEGATIVE NEGATIVE Final    Comment: (NOTE) The Xpert Xpress SARS-CoV-2/FLU/RSV plus assay is intended as an aid in the diagnosis of influenza from Nasopharyngeal swab specimens and should not be used as a sole basis for treatment. Nasal washings and aspirates are unacceptable for Xpert Xpress SARS-CoV-2/FLU/RSV testing.  Fact Sheet for Patients: BloggerCourse.com  Fact Sheet for Healthcare Providers: SeriousBroker.it  This test is not yet approved or cleared by the [place] FDA and has been authorized for detection and/or diagnosis of SARS-CoV-2 by FDA under an Emergency Use Authorization (EUA). This EUA will remain in effect (meaning this test can be used) for the duration of the COVID-19 declaration under Section 564(b)(1) of the Act, 21 U.S.C. section 360bbb-3(b)(1), unless the authorization is terminated or revoked.  Performed at [facility], [address] [place], [place] [address]      Labs: BNP (last 3 results) Recent Labs    10/08/20 1542 11/03/20 0156 12/12/20 1700  BNP 171.3* 80.5 203.7*   Basic Metabolic Panel: Recent Labs  Lab 12/12/20 1700 12/13/20 0812 12/14/20 0622 12/15/20 0555 12/16/20 0502  NA 131* 132*  136 136 135  K 5.5* 5.0 4.9 4.5 4.4  CL 84* 89* 89* 88* 88*  CO2 41* 34* 42* 38* 41*  GLUCOSE 117* 141* 160* 142* 137*  BUN 28* 30* 31* 37* 37*  CREATININE 1.10 1.04 0.86 0.87 0.87  CALCIUM 8.8* 9.0 8.8* 9.0 8.9  MG 1.8  --  2.1 1.9 1.8   Liver Function Tests: Recent Labs  Lab 12/12/20 1700  AST 25  ALT 9  ALKPHOS 56  BILITOT 1.2  PROT 6.6  ALBUMIN 3.2*   No results for input(s): LIPASE, AMYLASE in the last 168 hours. No results for input(s): AMMONIA in the last 168 hours. CBC: Recent Labs  Lab 12/12/20 1700 12/13/20 0812  WBC 5.9 6.1  NEUTROABS 3.9  --   HGB 10.3* 11.8*  HCT 33.3* 37.2*  MCV 95.1 94.9  PLT 212 167   Cardiac Enzymes: No results for input(s): CKTOTAL, CKMB, CKMBINDEX, TROPONINI in the last 168 hours. BNP: Invalid input(s): POCBNP CBG: Recent Labs  Lab 12/15/20 0814 12/15/20 1218 12/15/20 1524 12/15/20 2022 12/16/20 0727  GLUCAP 126* 174* 191* 179* 111*   D-Dimer No results for input(s): DDIMER in the last 72 hours. Hgb A1c No results for input(s): HGBA1C in the last 72 hours. Lipid Profile No results for input(s): CHOL, HDL, LDLCALC, TRIG, CHOLHDL, LDLDIRECT in the last 72 hours. Thyroid function studies No results for input(s): TSH, T4TOTAL, T3FREE, THYROIDAB in the last 72 hours.  Invalid input(s): FREET3 Anemia work up No results for input(s): VITAMINB12, FOLATE, FERRITIN, TIBC, IRON, RETICCTPCT in the last 72 hours. Urinalysis    Component Value Date/Time   COLORURINE YELLOW (A) 10/08/2020 1656   APPEARANCEUR HAZY (A) 10/08/2020 1656   APPEARANCEUR Hazy (A) 12/25/2018 1442   LABSPEC 1.019 10/08/2020 1656   PHURINE 5.0 10/08/2020 1656   GLUCOSEU NEGATIVE 10/08/2020 1656   HGBUR SMALL (A) 10/08/2020 1656   BILIRUBINUR NEGATIVE 10/08/2020 1656   BILIRUBINUR Negative 12/25/2018 1442   KETONESUR NEGATIVE 10/08/2020 1656   PROTEINUR 30 (A) 10/08/2020 1656   NITRITE NEGATIVE 10/08/2020 1656   LEUKOCYTESUR TRACE (A) 10/08/2020  1656   Sepsis Labs Invalid input(s): PROCALCITONIN,  WBC,  LACTICIDVEN Microbiology Recent Results (from the past 240 hour(s))  Resp Panel by RT-PCR (Flu A&B, Covid) Nasopharyngeal Swab     Status: None  Collection Time: 12/12/20  5:00 PM   Specimen: Nasopharyngeal Swab; Nasopharyngeal(NP) swabs in vial transport medium  Result Value Ref Range Status   SARS Coronavirus 2 by RT PCR NEGATIVE NEGATIVE Final    Comment: (NOTE) SARS-CoV-2 target nucleic acids are NOT DETECTED.  The SARS-CoV-2 RNA is generally detectable in upper respiratory specimens during the acute phase of infection. The lowest concentration of SARS-CoV-2 viral copies this assay can detect is 138 copies/mL. A negative result does not preclude SARS-Cov-2 infection and should not be used as the sole basis for treatment or other patient management decisions. A negative result may occur with  improper specimen collection/handling, submission of specimen other than nasopharyngeal swab, presence of viral mutation(s) within the areas targeted by this assay, and inadequate number of viral copies(<138 copies/mL). A negative result must be combined with clinical observations, patient history, and epidemiological information. The expected result is Negative.  Fact Sheet for Patients:  BloggerCourse.com  Fact Sheet for Healthcare Providers:  SeriousBroker.it  This test is no t yet approved or cleared by the [place] FDA and  has been authorized for detection and/or diagnosis of SARS-CoV-2 by FDA under an Emergency Use Authorization (EUA). This EUA will remain  in effect (meaning this test can be used) for the duration of the COVID-19 declaration under Section 564(b)(1) of the Act, 21 U.S.C.section 360bbb-3(b)(1), unless the authorization is terminated  or revoked sooner.       Influenza A by PCR NEGATIVE NEGATIVE Final   Influenza B by PCR NEGATIVE NEGATIVE Final     Comment: (NOTE) The Xpert Xpress SARS-CoV-2/FLU/RSV plus assay is intended as an aid in the diagnosis of influenza from Nasopharyngeal swab specimens and should not be used as a sole basis for treatment. Nasal washings and aspirates are unacceptable for Xpert Xpress SARS-CoV-2/FLU/RSV testing.  Fact Sheet for Patients: BloggerCourse.com  Fact Sheet for Healthcare Providers: SeriousBroker.it  This test is not yet approved or cleared by the [place] FDA and has been authorized for detection and/or diagnosis of SARS-CoV-2 by FDA under an Emergency Use Authorization (EUA). This EUA will remain in effect (meaning this test can be used) for the duration of the COVID-19 declaration under Section 564(b)(1) of the Act, 21 U.S.C. section 360bbb-3(b)(1), unless the authorization is terminated or revoked.  Performed at [facility], [address] [place], [place] [address]      Time coordinating discharge: Over 30 minutes  SIGNED:   Alvira Philips [place], DO  [facility] 12/16/2020, 10:16 AM

## 2020-12-16 NOTE — TOC Progression Note (Signed)
Transition of Care [facility]) - Progression Note    Patient Details  Name: [name] [name] MRN: [id number] Date of Birth: [birth year]  Transition of [facility]) CM/SW Contact  Caryn Section, RN Phone Number: 12/16/2020, 12:52 PM  Clinical Narrative:   As per care team, patient can return to [facility] today.  Room 23 as per Tonya.  Patient aware of his transfer today, states that he will contact his sisters to let them know.  EMS states patient is 4th on the list to transfer.    Patient's cpap settings have been evaluated, no further needs per MD.        Expected Discharge Plan and Services           Expected Discharge Date: 12/16/20                                     Social Determinants of Health (SDOH) Interventions    Readmission Risk Interventions Readmission Risk Prevention Plan 01/11/2020 12/24/2019 01/25/2019  Transportation Screening Complete Complete Complete  PCP or Specialist Appt within 3-5 Days - - -  HRI or Home Care Consult - - -  Social Work Consult for Recovery Care Planning/Counseling - - -  Palliative Care Screening - - -  Medication Review [facility]) Complete Complete Complete  PCP or Specialist appointment within 3-5 days of discharge Complete Complete Complete  HRI or Home Care Consult - - Complete  SW Recovery Care/Counseling Consult Complete Complete Complete  Palliative Care Screening Complete Not Applicable Not Applicable  Skilled Nursing Facility Not Applicable Not Applicable Not Applicable  Some recent data might be hidden

## 2020-12-16 NOTE — Progress Notes (Signed)
Patient being discharged to [facility]. Called to give report with no answer. Patient is supposed to be admitted to room 23. All paperwork in the discharge packet. Patient will be transported via EMS.

## 2020-12-24 ENCOUNTER — Emergency Department
Admission: EM | Admit: 2020-12-24 | Discharge: 2020-12-25 | Disposition: A | Discharge status: Home / Self Care | Payer: Medicaid Other | Attending: Emergency Medicine | Admitting: Emergency Medicine

## 2020-12-24 DIAGNOSIS — I2510 Atherosclerotic heart disease of native coronary artery without angina pectoris: Secondary | ICD-10-CM | POA: Insufficient documentation

## 2020-12-24 DIAGNOSIS — E039 Hypothyroidism, unspecified: Secondary | ICD-10-CM | POA: Diagnosis not present

## 2020-12-24 DIAGNOSIS — W19XXXA Unspecified fall, initial encounter: Secondary | ICD-10-CM | POA: Insufficient documentation

## 2020-12-24 DIAGNOSIS — R519 Headache, unspecified: Secondary | ICD-10-CM | POA: Diagnosis not present

## 2020-12-24 DIAGNOSIS — I4819 Other persistent atrial fibrillation: Secondary | ICD-10-CM | POA: Insufficient documentation

## 2020-12-24 DIAGNOSIS — I110 Hypertensive heart disease with heart failure: Secondary | ICD-10-CM | POA: Diagnosis not present

## 2020-12-24 DIAGNOSIS — Z043 Encounter for examination and observation following other accident: Secondary | ICD-10-CM | POA: Diagnosis not present

## 2020-12-24 DIAGNOSIS — J449 Chronic obstructive pulmonary disease, unspecified: Secondary | ICD-10-CM | POA: Insufficient documentation

## 2020-12-24 DIAGNOSIS — J45909 Unspecified asthma, uncomplicated: Secondary | ICD-10-CM | POA: Diagnosis not present

## 2020-12-24 DIAGNOSIS — I5043 Acute on chronic combined systolic (congestive) and diastolic (congestive) heart failure: Secondary | ICD-10-CM | POA: Diagnosis not present

## 2020-12-24 DIAGNOSIS — Z79899 Other long term (current) drug therapy: Secondary | ICD-10-CM | POA: Insufficient documentation

## 2020-12-24 DIAGNOSIS — E119 Type 2 diabetes mellitus without complications: Secondary | ICD-10-CM | POA: Diagnosis not present

## 2020-12-24 DIAGNOSIS — Z7982 Long term (current) use of aspirin: Secondary | ICD-10-CM | POA: Insufficient documentation

## 2020-12-24 DIAGNOSIS — Z7951 Long term (current) use of inhaled steroids: Secondary | ICD-10-CM | POA: Diagnosis not present

## 2020-12-24 DIAGNOSIS — Z7901 Long term (current) use of anticoagulants: Secondary | ICD-10-CM | POA: Diagnosis not present

## 2020-12-24 DIAGNOSIS — R531 Weakness: Secondary | ICD-10-CM | POA: Diagnosis not present

## 2020-12-24 DIAGNOSIS — I959 Hypotension, unspecified: Secondary | ICD-10-CM | POA: Diagnosis not present

## 2020-12-24 DIAGNOSIS — I10 Essential (primary) hypertension: Secondary | ICD-10-CM | POA: Diagnosis not present

## 2020-12-24 NOTE — ED Triage Notes (Signed)
Pt states he does not know why he is here. Per EMS pt fell. Fall was unwitnessed and the facility wanted him to get a scan to see if everything was alright.

## 2020-12-25 ENCOUNTER — Emergency Department: Payer: Medicaid Other

## 2020-12-25 DIAGNOSIS — S199XXA Unspecified injury of neck, initial encounter: Secondary | ICD-10-CM | POA: Diagnosis not present

## 2020-12-25 DIAGNOSIS — S0990XA Unspecified injury of head, initial encounter: Secondary | ICD-10-CM | POA: Diagnosis not present

## 2020-12-25 NOTE — ED Notes (Signed)
Pt alert and oriented at time of transport.

## 2020-12-25 NOTE — Progress Notes (Deleted)
Patient ID: [name] [name], male    DOB: 01/04/1961, 60 y.o.   MRN: [id number]  HPI  [name] [name] is a 60 y/o male with a history of asthma, COPD, atrial fibrillation, obstructive sleep apnea and chronic heart failure.   Echo report from 12/13/20 reviewed and showed an EF of 25-30%. Echo report from 06/23/19 reviewed and showed an EF of 55-60%. Echo report from 01/17/18 reviewed and showed an EF of 30%.  Was in the ED 12/24/20 due to unwitnessed fall at [facility]. Head CT negative. Spine CT negative and was released. Admitted 12/12/20 due to AMS, shortness of breath & hypoxia. Oxygen needed at 10L to maintain sats and placed on bipap. Able to wean oxygen down to baseline 3L. Initially given IV lasix with transition to oral diuretics. Given IV solu-medrol with change to prednisone. Discharged after 4 days.   He presents today for a follow-up visit although hasn't been seen since October 2021. He presents with a chief complaint of     Past Medical History:  Diagnosis Date   Allergy    Anxiety    Arthritis    Asthma    Brain damage    Chronic pain of both knees 07/13/2018   Clotting disorder (HCC)    COPD (chronic obstructive pulmonary disease) (HCC)    Depression    GERD (gastroesophageal reflux disease)    HFrEF (heart failure with reduced ejection fraction) ([place])    a. 03/2018 Echo: EF 25-30%, diff HK. Mod LAE.   History of DVT (deep vein thrombosis)    History of pulmonary embolism    a. Chronic coumadin.   Hypertension    MI (myocardial infarction) ([place])    Morbid obesity ([place])    Neck pain 07/13/2018   NICM (nonischemic cardiomyopathy) ([place])    a. s/p Cath x 3 - reportedly nl cors. Last cath 2019 in [place]; b. a. 03/2018 Echo: EF 25-30%, diff HK.   Persistent atrial fibrillation ([place])    a. 03/2018 s/p DCCV; b. CHA2DS2VASc = 1-->Xarelto (later changed to warfarin); c. 05/2018 recurrent afib-->Amio initiated.   Sleep apnea    Sleep apnea    Past Surgical History:  Procedure Laterality  Date   CARDIOVERSION N/A 03/24/2018   Procedure: CARDIOVERSION;  Surgeon: [name] Merritts, MD;  Location: [facility] ORS;  Service: Cardiovascular;  Laterality: N/A;   CARDIOVERSION N/A 08/08/2018   Procedure: CARDIOVERSION;  Surgeon: [name] Bush, MD;  Location: [facility] ORS;  Service: Cardiovascular;  Laterality: N/A;   hearnia repair     X 3- total of two surgeries   HERNIA REPAIR     LEG SURGERY     Family History  Problem Relation Age of Onset   Heart failure Mother    Lung cancer Mother    Lung cancer Father    Heart attack Maternal Grandmother    Heart attack Maternal Grandfather    Social History   Tobacco Use   Smoking status: Never   Smokeless tobacco: Never  Substance Use Topics   Alcohol use: Yes    Comment: half a gallon of liquor per day per H&P/patient   No Known Allergies     Review of Systems  Constitutional:  Positive for fatigue (with minimal exertion). Negative for appetite change.  HENT:  Positive for rhinorrhea. Negative for congestion and sore throat.   Eyes: Negative.   Respiratory:  Positive for chest tightness (constant pressure) and shortness of breath (with minimal exertion). Negative for cough.   Cardiovascular:  Positive for chest pain (at times) and leg swelling. Negative for palpitations.  Gastrointestinal:  Negative for abdominal distention and abdominal pain.  Endocrine: Negative.   Genitourinary: Negative.   Musculoskeletal:  Positive for arthralgias (right hip) and back pain (chronic).  Skin:  Positive for wound (both lower legs; wrapped in UNNA boots).  Allergic/Immunologic: Negative.   Neurological:  Positive for dizziness. Negative for light-headedness.  Hematological:  Negative for adenopathy. Does not bruise/bleed easily.  Psychiatric/Behavioral:  Negative for dysphoric mood and sleep disturbance (wearing CPAP at night). The patient is not nervous/anxious.      Physical Exam Vitals and nursing note reviewed.  Constitutional:       Appearance: He is well-developed.  HENT:     Head: Normocephalic and atraumatic.  Neck:     Vascular: No JVD.  Cardiovascular:     Rate and Rhythm: Normal rate and regular rhythm.  Pulmonary:     Effort: Pulmonary effort is normal.     Breath sounds: Normal breath sounds. No wheezing or rales.  Abdominal:     Palpations: Abdomen is soft.     Tenderness: There is no abdominal tenderness.  Musculoskeletal:     Cervical back: Normal range of motion and neck supple.     Right lower leg: No tenderness. Edema (wrapped in [place] boot) present.     Left lower leg: No tenderness. Edema (wrapped in [place] boot) present.  Skin:    General: Skin is warm and dry.  Neurological:     General: No focal deficit present.     Mental Status: He is alert and oriented to person, place, and time.  Psychiatric:        Mood and Affect: Mood normal.        Behavior: Behavior normal.   Assessment & Plan:  1: Chronic heart failure with now reduced ejection fraction- - NYHA class III - euvolemic today - not weighing daily but does have scales; encouraged to resume weighing daily and to call for an overnight weight gain of >2 pounds or a weekly weight gain of >5 pounds - not adding salt and has been trying to read food labels for sodium content - saw cardiology [name] [name]) 10/16/19 - BNP 12/12/20 was 203.7 - currently at SNF so paramedicine program on hold   2: HTN- - BP  - sees PCP [name] [name]) but is currently being followed at SNF - BMP 12/16/20 reviewed and showed sodium 135, potassium 4.4, creatinine 0.87 and GFR >60  3: Obstructive sleep apnea- - wearing CPAP nightly - has oxygen at 3L that he wears at home but he didn't bring it with him today  4: Lymphedema- - stage 2 - went to wound clinic yesterday - currently has both lower legs wrapped in UNNA boots   Patient did not bring his medications nor a list. Each medication was verbally reviewed with the patient and he was encouraged to bring the  bottles to every visit to confirm accuracy of list.
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
## 2020-12-26 ENCOUNTER — Inpatient Hospital Stay
Admission: EM | Admit: 2020-12-26 | Discharge: 2020-12-27 | DRG: 189 | Disposition: A | Discharge status: Skilled Nursing Facility | Payer: Medicaid Other | Source: Skilled Nursing Facility | Attending: Student in an Organized Health Care Education/Training Program | Admitting: Student in an Organized Health Care Education/Training Program

## 2020-12-26 ENCOUNTER — Ambulatory Visit: Payer: Medicaid Other | Admitting: Family

## 2020-12-26 ENCOUNTER — Other Ambulatory Visit: Payer: Self-pay

## 2020-12-26 ENCOUNTER — Inpatient Hospital Stay: Payer: Medicaid Other

## 2020-12-26 ENCOUNTER — Emergency Department: Payer: Medicaid Other

## 2020-12-26 DIAGNOSIS — I2510 Atherosclerotic heart disease of native coronary artery without angina pectoris: Secondary | ICD-10-CM | POA: Diagnosis present

## 2020-12-26 DIAGNOSIS — E038 Other specified hypothyroidism: Secondary | ICD-10-CM | POA: Diagnosis not present

## 2020-12-26 DIAGNOSIS — J9621 Acute and chronic respiratory failure with hypoxia: Secondary | ICD-10-CM | POA: Diagnosis not present

## 2020-12-26 DIAGNOSIS — R0602 Shortness of breath: Secondary | ICD-10-CM | POA: Diagnosis not present

## 2020-12-26 DIAGNOSIS — Z9981 Dependence on supplemental oxygen: Secondary | ICD-10-CM

## 2020-12-26 DIAGNOSIS — E785 Hyperlipidemia, unspecified: Secondary | ICD-10-CM | POA: Diagnosis present

## 2020-12-26 DIAGNOSIS — Z6841 Body Mass Index (BMI) 40.0 and over, adult: Secondary | ICD-10-CM

## 2020-12-26 DIAGNOSIS — I428 Other cardiomyopathies: Secondary | ICD-10-CM | POA: Diagnosis present

## 2020-12-26 DIAGNOSIS — E662 Morbid (severe) obesity with alveolar hypoventilation: Secondary | ICD-10-CM | POA: Diagnosis not present

## 2020-12-26 DIAGNOSIS — Z20822 Contact with and (suspected) exposure to covid-19: Secondary | ICD-10-CM | POA: Diagnosis present

## 2020-12-26 DIAGNOSIS — I509 Heart failure, unspecified: Secondary | ICD-10-CM

## 2020-12-26 DIAGNOSIS — I517 Cardiomegaly: Secondary | ICD-10-CM | POA: Diagnosis not present

## 2020-12-26 DIAGNOSIS — Z79890 Hormone replacement therapy (postmenopausal): Secondary | ICD-10-CM

## 2020-12-26 DIAGNOSIS — F419 Anxiety disorder, unspecified: Secondary | ICD-10-CM | POA: Diagnosis present

## 2020-12-26 DIAGNOSIS — I110 Hypertensive heart disease with heart failure: Secondary | ICD-10-CM | POA: Diagnosis not present

## 2020-12-26 DIAGNOSIS — K219 Gastro-esophageal reflux disease without esophagitis: Secondary | ICD-10-CM | POA: Diagnosis present

## 2020-12-26 DIAGNOSIS — Z86711 Personal history of pulmonary embolism: Secondary | ICD-10-CM

## 2020-12-26 DIAGNOSIS — L8990 Pressure ulcer of unspecified site, unspecified stage: Secondary | ICD-10-CM | POA: Insufficient documentation

## 2020-12-26 DIAGNOSIS — J441 Chronic obstructive pulmonary disease with (acute) exacerbation: Secondary | ICD-10-CM | POA: Diagnosis not present

## 2020-12-26 DIAGNOSIS — F32A Depression, unspecified: Secondary | ICD-10-CM | POA: Diagnosis present

## 2020-12-26 DIAGNOSIS — L89892 Pressure ulcer of other site, stage 2: Secondary | ICD-10-CM | POA: Diagnosis present

## 2020-12-26 DIAGNOSIS — Z7901 Long term (current) use of anticoagulants: Secondary | ICD-10-CM

## 2020-12-26 DIAGNOSIS — S0990XA Unspecified injury of head, initial encounter: Secondary | ICD-10-CM | POA: Diagnosis not present

## 2020-12-26 DIAGNOSIS — I4821 Permanent atrial fibrillation: Secondary | ICD-10-CM | POA: Diagnosis present

## 2020-12-26 DIAGNOSIS — Z7982 Long term (current) use of aspirin: Secondary | ICD-10-CM

## 2020-12-26 DIAGNOSIS — S199XXA Unspecified injury of neck, initial encounter: Secondary | ICD-10-CM | POA: Diagnosis not present

## 2020-12-26 DIAGNOSIS — M109 Gout, unspecified: Secondary | ICD-10-CM | POA: Diagnosis present

## 2020-12-26 DIAGNOSIS — I252 Old myocardial infarction: Secondary | ICD-10-CM | POA: Diagnosis not present

## 2020-12-26 DIAGNOSIS — J811 Chronic pulmonary edema: Secondary | ICD-10-CM | POA: Diagnosis not present

## 2020-12-26 DIAGNOSIS — I5043 Acute on chronic combined systolic (congestive) and diastolic (congestive) heart failure: Secondary | ICD-10-CM | POA: Diagnosis present

## 2020-12-26 DIAGNOSIS — I4891 Unspecified atrial fibrillation: Secondary | ICD-10-CM | POA: Diagnosis present

## 2020-12-26 DIAGNOSIS — Z66 Do not resuscitate: Secondary | ICD-10-CM | POA: Diagnosis present

## 2020-12-26 DIAGNOSIS — I700 Atherosclerosis of aorta: Secondary | ICD-10-CM | POA: Diagnosis not present

## 2020-12-26 DIAGNOSIS — J9622 Acute and chronic respiratory failure with hypercapnia: Secondary | ICD-10-CM | POA: Diagnosis not present

## 2020-12-26 DIAGNOSIS — Z86718 Personal history of other venous thrombosis and embolism: Secondary | ICD-10-CM

## 2020-12-26 DIAGNOSIS — Z79899 Other long term (current) drug therapy: Secondary | ICD-10-CM

## 2020-12-26 DIAGNOSIS — Z8249 Family history of ischemic heart disease and other diseases of the circulatory system: Secondary | ICD-10-CM

## 2020-12-26 DIAGNOSIS — E119 Type 2 diabetes mellitus without complications: Secondary | ICD-10-CM | POA: Diagnosis present

## 2020-12-26 DIAGNOSIS — I10 Essential (primary) hypertension: Secondary | ICD-10-CM | POA: Diagnosis present

## 2020-12-26 DIAGNOSIS — E039 Hypothyroidism, unspecified: Secondary | ICD-10-CM | POA: Diagnosis present

## 2020-12-26 DIAGNOSIS — Z7951 Long term (current) use of inhaled steroids: Secondary | ICD-10-CM

## 2020-12-26 DIAGNOSIS — G8929 Other chronic pain: Secondary | ICD-10-CM | POA: Diagnosis present

## 2020-12-26 LAB — BLOOD GAS, ARTERIAL
Acid-Base Excess: 16.1 mmol/L — ABNORMAL HIGH (ref 0.0–2.0)
Bicarbonate: 44.8 mmol/L — ABNORMAL HIGH (ref 20.0–28.0)
Delivery systems: POSITIVE
Expiratory PAP: 6
FIO2: 0.4
Inspiratory PAP: 14
O2 Saturation: 98.4 %
PEEP: 6 cmH2O
Patient temperature: 37
RATE: 12 resp/min
pCO2 arterial: 83 mmHg (ref 32.0–48.0)
pH, Arterial: 7.34 — ABNORMAL LOW (ref 7.350–7.450)
pO2, Arterial: 119 mmHg — ABNORMAL HIGH (ref 83.0–108.0)

## 2020-12-26 LAB — CBC WITH DIFFERENTIAL/PLATELET
Abs Immature Granulocytes: 0.06 10*3/uL (ref 0.00–0.07)
Basophils Absolute: 0 10*3/uL (ref 0.0–0.1)
Basophils Relative: 0 %
Eosinophils Absolute: 0.4 10*3/uL (ref 0.0–0.5)
Eosinophils Relative: 5 %
HCT: 31.6 % — ABNORMAL LOW (ref 39.0–52.0)
Hemoglobin: 9.5 g/dL — ABNORMAL LOW (ref 13.0–17.0)
Immature Granulocytes: 1 %
Lymphocytes Relative: 12 %
Lymphs Abs: 1 10*3/uL (ref 0.7–4.0)
MCH: 28.7 pg (ref 26.0–34.0)
MCHC: 30.1 g/dL (ref 30.0–36.0)
MCV: 95.5 fL (ref 80.0–100.0)
Monocytes Absolute: 0.9 10*3/uL (ref 0.1–1.0)
Monocytes Relative: 10 %
Neutro Abs: 5.9 10*3/uL (ref 1.7–7.7)
Neutrophils Relative %: 72 %
Platelets: 223 10*3/uL (ref 150–400)
RBC: 3.31 MIL/uL — ABNORMAL LOW (ref 4.22–5.81)
RDW: 16.2 % — ABNORMAL HIGH (ref 11.5–15.5)
WBC: 8.3 10*3/uL (ref 4.0–10.5)
nRBC: 0 % (ref 0.0–0.2)

## 2020-12-26 LAB — COMPREHENSIVE METABOLIC PANEL
ALT: 11 U/L (ref 0–44)
AST: 13 U/L — ABNORMAL LOW (ref 15–41)
Albumin: 2.7 g/dL — ABNORMAL LOW (ref 3.5–5.0)
Alkaline Phosphatase: 44 U/L (ref 38–126)
Anion gap: 5 (ref 5–15)
BUN: 30 mg/dL — ABNORMAL HIGH (ref 6–20)
CO2: 41 mmol/L — ABNORMAL HIGH (ref 22–32)
Calcium: 8.6 mg/dL — ABNORMAL LOW (ref 8.9–10.3)
Chloride: 92 mmol/L — ABNORMAL LOW (ref 98–111)
Creatinine, Ser: 0.94 mg/dL (ref 0.61–1.24)
GFR, Estimated: >60 mL/min (ref >60)
Glucose, Bld: 120 mg/dL — ABNORMAL HIGH (ref 70–99)
Potassium: 4.6 mmol/L (ref 3.5–5.1)
Sodium: 138 mmol/L (ref 135–145)
Total Bilirubin: 0.6 mg/dL (ref 0.3–1.2)
Total Protein: 5.5 g/dL — ABNORMAL LOW (ref 6.5–8.1)

## 2020-12-26 LAB — BLOOD GAS, VENOUS
Acid-Base Excess: 18.4 mmol/L — ABNORMAL HIGH (ref 0.0–2.0)
Bicarbonate: 46.7 mmol/L — ABNORMAL HIGH (ref 20.0–28.0)
O2 Saturation: 98.6 %
Patient temperature: 37
pCO2, Ven: 79 mmHg (ref 44.0–60.0)
pH, Ven: 7.38 (ref 7.250–7.430)
pO2, Ven: 121 mmHg — ABNORMAL HIGH (ref 32.0–45.0)

## 2020-12-26 LAB — APTT: aPTT: 29 seconds (ref 24–36)

## 2020-12-26 LAB — PROTIME-INR
INR: 1.3 — ABNORMAL HIGH (ref 0.8–1.2)
Prothrombin Time: 16.1 seconds — ABNORMAL HIGH (ref 11.4–15.2)

## 2020-12-26 LAB — RESP PANEL BY RT-PCR (FLU A&B, COVID) ARPGX2
Influenza A by PCR: NEGATIVE
Influenza B by PCR: NEGATIVE
SARS Coronavirus 2 by RT PCR: NEGATIVE

## 2020-12-26 LAB — LIPASE, BLOOD: Lipase: 32 U/L (ref 11–51)

## 2020-12-26 LAB — LACTIC ACID, PLASMA: Lactic Acid, Venous: 0.8 mmol/L (ref 0.5–1.9)

## 2020-12-26 LAB — BRAIN NATRIURETIC PEPTIDE: B Natriuretic Peptide: 266.2 pg/mL — ABNORMAL HIGH (ref 0.0–100.0)

## 2020-12-26 LAB — TROPONIN I (HIGH SENSITIVITY): Troponin I (High Sensitivity): 24 ng/L — ABNORMAL HIGH (ref <18)

## 2020-12-26 LAB — PROCALCITONIN: Procalcitonin: <0.1 ng/mL

## 2020-12-26 MED ORDER — SODIUM CHLORIDE 0.9 % IV SOLN
1.0000 g | Freq: Once | INTRAVENOUS | Status: AC
  Administered 2020-12-26: 1 g via INTRAVENOUS
  Filled 2020-12-26: qty 1

## 2020-12-26 MED ORDER — FLUTICASONE PROPIONATE 50 MCG/ACT NA SUSP
1.0000 | Freq: Every evening | NASAL | Status: DC | PRN
  Filled 2020-12-26: qty 16

## 2020-12-26 MED ORDER — ASPIRIN EC 81 MG PO TBEC
81.0000 mg | DELAYED_RELEASE_TABLET | Freq: Every day | ORAL | Status: DC
  Administered 2020-12-27: 81 mg via ORAL
  Filled 2020-12-26: qty 1

## 2020-12-26 MED ORDER — GABAPENTIN 300 MG PO CAPS
300.0000 mg | ORAL_CAPSULE | Freq: Two times a day (BID) | ORAL | Status: DC
  Administered 2020-12-26 – 2020-12-27 (×2): 300 mg via ORAL
  Filled 2020-12-26 (×2): qty 1

## 2020-12-26 MED ORDER — FLUTICASONE PROPIONATE 50 MCG/ACT NA SUSP: 1.0000 | Freq: Every day | NASAL | Status: DC

## 2020-12-26 MED ORDER — TRAZODONE HCL 100 MG PO TABS
100.0000 mg | ORAL_TABLET | Freq: Every day | ORAL | Status: DC
  Administered 2020-12-26: 100 mg via ORAL
  Filled 2020-12-26: qty 1

## 2020-12-26 MED ORDER — ONDANSETRON HCL 4 MG PO TABS: 4.0000 mg | ORAL_TABLET | Freq: Four times a day (QID) | ORAL | Status: DC | PRN

## 2020-12-26 MED ORDER — UMECLIDINIUM BROMIDE 62.5 MCG/ACT IN AEPB
1.0000 | INHALATION_SPRAY | Freq: Every day | RESPIRATORY_TRACT | Status: DC
  Administered 2020-12-27: 1 via RESPIRATORY_TRACT
  Filled 2020-12-26 (×2): qty 7

## 2020-12-26 MED ORDER — FLUTICASONE FUROATE-VILANTEROL 100-25 MCG/ACT IN AEPB
1.0000 | INHALATION_SPRAY | Freq: Every day | RESPIRATORY_TRACT | Status: DC
  Administered 2020-12-27: 1 via RESPIRATORY_TRACT
  Filled 2020-12-26: qty 28

## 2020-12-26 MED ORDER — ADULT MULTIVITAMIN W/MINERALS CH
1.0000 | ORAL_TABLET | Freq: Every day | ORAL | Status: DC
Start: 2020-12-26 — End: 2020-12-27
  Administered 2020-12-27: 1 via ORAL
  Filled 2020-12-26: qty 1

## 2020-12-26 MED ORDER — LEVOTHYROXINE SODIUM 50 MCG PO TABS
75.0000 ug | ORAL_TABLET | Freq: Every day | ORAL | Status: DC
  Administered 2020-12-27: 75 ug via ORAL
  Filled 2020-12-26: qty 1

## 2020-12-26 MED ORDER — SPIRONOLACTONE 25 MG PO TABS
12.5000 mg | ORAL_TABLET | Freq: Every day | ORAL | Status: DC
  Administered 2020-12-27: 12.5 mg via ORAL
  Filled 2020-12-26: qty 0.5
  Filled 2020-12-26: qty 1

## 2020-12-26 MED ORDER — APIXABAN 5 MG PO TABS
5.0000 mg | ORAL_TABLET | Freq: Two times a day (BID) | ORAL | Status: DC
  Administered 2020-12-26 – 2020-12-27 (×2): 5 mg via ORAL
  Filled 2020-12-26 (×2): qty 1

## 2020-12-26 MED ORDER — IPRATROPIUM-ALBUTEROL 0.5-2.5 (3) MG/3ML IN SOLN: 3.0000 mL | Freq: Four times a day (QID) | RESPIRATORY_TRACT | Status: DC | PRN

## 2020-12-26 MED ORDER — NYSTATIN 100000 UNIT/GM EX POWD
1.0000 "application " | Freq: Two times a day (BID) | CUTANEOUS | Status: DC
  Administered 2020-12-27: 1 via TOPICAL
  Filled 2020-12-26: qty 15

## 2020-12-26 MED ORDER — ATORVASTATIN CALCIUM 20 MG PO TABS
40.0000 mg | ORAL_TABLET | Freq: Every day | ORAL | Status: DC
  Administered 2020-12-27: 40 mg via ORAL
  Filled 2020-12-26: qty 2

## 2020-12-26 MED ORDER — SODIUM CHLORIDE 0.9 % IV BOLUS
250.0000 mL | Freq: Once | INTRAVENOUS | Status: AC
  Administered 2020-12-26: 250 mL via INTRAVENOUS

## 2020-12-26 MED ORDER — AMIODARONE HCL 200 MG PO TABS
200.0000 mg | ORAL_TABLET | Freq: Two times a day (BID) | ORAL | Status: DC
  Administered 2020-12-26 – 2020-12-27 (×2): 200 mg via ORAL
  Filled 2020-12-26 (×3): qty 1

## 2020-12-26 MED ORDER — ACETAMINOPHEN 325 MG PO TABS
650.0000 mg | ORAL_TABLET | Freq: Four times a day (QID) | ORAL | Status: DC | PRN
  Administered 2020-12-27: 650 mg via ORAL
  Filled 2020-12-26: qty 2

## 2020-12-26 MED ORDER — LISINOPRIL 5 MG PO TABS
2.5000 mg | ORAL_TABLET | Freq: Every day | ORAL | Status: DC
  Administered 2020-12-27: 2.5 mg via ORAL
  Filled 2020-12-26: qty 1

## 2020-12-26 MED ORDER — FLUTICASONE-UMECLIDIN-VILANT 100-62.5-25 MCG/ACT IN AEPB: 1.0000 | INHALATION_SPRAY | Freq: Every day | RESPIRATORY_TRACT | Status: DC

## 2020-12-26 MED ORDER — NITROGLYCERIN 0.4 MG SL SUBL: 0.4000 mg | SUBLINGUAL_TABLET | SUBLINGUAL | Status: DC | PRN

## 2020-12-26 MED ORDER — CARVEDILOL 6.25 MG PO TABS
6.2500 mg | ORAL_TABLET | Freq: Two times a day (BID) | ORAL | Status: DC
  Administered 2020-12-26 – 2020-12-27 (×2): 6.25 mg via ORAL
  Filled 2020-12-26: qty 1

## 2020-12-26 MED ORDER — ALLOPURINOL 100 MG PO TABS
100.0000 mg | ORAL_TABLET | Freq: Every day | ORAL | Status: DC
  Administered 2020-12-27: 100 mg via ORAL
  Filled 2020-12-26: qty 1

## 2020-12-26 MED ORDER — FOLIC ACID 1 MG PO TABS
1.0000 mg | ORAL_TABLET | Freq: Every day | ORAL | Status: DC
  Administered 2020-12-27: 1 mg via ORAL
  Filled 2020-12-26: qty 1

## 2020-12-26 MED ORDER — SODIUM CHLORIDE 0.9 % IV SOLN
500.0000 mg | Freq: Once | INTRAVENOUS | Status: AC
  Administered 2020-12-26: 500 mg via INTRAVENOUS

## 2020-12-26 MED ORDER — PRO-STAT SUGAR FREE PO LIQD
30.0000 mL | Freq: Two times a day (BID) | ORAL | Status: DC
  Administered 2020-12-26: 30 mL via ORAL

## 2020-12-26 MED ORDER — COLCHICINE 0.6 MG PO TABS
0.6000 mg | ORAL_TABLET | Freq: Every day | ORAL | Status: DC
  Administered 2020-12-27: 0.6 mg via ORAL
  Filled 2020-12-26: qty 1

## 2020-12-26 MED ORDER — LORATADINE 10 MG PO TABS
10.0000 mg | ORAL_TABLET | Freq: Every day | ORAL | Status: DC
  Administered 2020-12-27: 10 mg via ORAL
  Filled 2020-12-26: qty 1

## 2020-12-26 MED ORDER — TORSEMIDE 20 MG PO TABS
40.0000 mg | ORAL_TABLET | Freq: Every day | ORAL | Status: DC
  Administered 2020-12-27: 40 mg via ORAL
  Filled 2020-12-26: qty 2

## 2020-12-26 MED ORDER — ALPRAZOLAM 0.5 MG PO TABS: 0.5000 mg | ORAL_TABLET | Freq: Four times a day (QID) | ORAL | Status: DC | PRN

## 2020-12-26 MED ORDER — ONDANSETRON HCL 4 MG/2ML IJ SOLN: 4.0000 mg | Freq: Four times a day (QID) | INTRAMUSCULAR | Status: DC | PRN

## 2020-12-26 NOTE — ED Notes (Signed)
Multiple RN attempt IV access with no success

## 2020-12-26 NOTE — ED Notes (Signed)
Pt changed from soiled brief and blankets. Multiple bed sores to bottom and back of legs noted

## 2020-12-26 NOTE — ED Notes (Signed)
Called [facility] to get pts dnr and other records including mar

## 2020-12-26 NOTE — ED Notes (Addendum)
Pt arm repositioned to better allow NS and antibiotics to flow. Bipap In place as ordered. Responsive to verbal stimuli

## 2020-12-26 NOTE — H&P (Signed)
History and Physical    [name] [name] [id number] DOB: [birth year] DOA: 12/26/2020  PCP: [name], DO   Patient coming from: [place] have personally briefly reviewed patient's old medical records in [place]  Chief Complaint: Shortness of breath  Most of the history was obtained from ER notes as patient is too lethargic to provide any history  HPI: [name] [name] is a 60 y.o. male with medical history significant for severe morbid obesity with BMI of 62, OSA on CPAP, permanent A. fib on Coumadin, hyperlipidemia, hypertension, chronic anxiety/depression, gout, alcohol use disorder, frequent admission for COPD exacerbation who presented to [facility] ED from the skilled nursing facility where he resides for evaluation of shortness of breath. Per ER documentation patient had room air pulse oximetry of 85% and was placed on 3 L of oxygen by EMS. During my evaluation patient is noted to be on 4 L of oxygen and is lethargic and unable to stay awake to provide any history. I am unable to do a review of systems on this patient due to his mental status. Venous blood gas 7.3 8/79/121/18 0.4/46.7/98.6 Sodium 138, potassium 4.6, chloride 92, bicarb 41, glucose 120, BUN 30, creatinine 0.94, calcium 8.6, alkaline phosphatase 44, albumin 2.7, lipase 32, AST 13, ALT 11, total protein 5.5, BNP 266, troponin 24, lactic acid 0.8, procalcitonin less than 0.10, white count 8.3, hemoglobin 9.5, hematocrit 31.6, MCV 95.5, RDW 16.2, platelet count 223, PT 16.1, INR 1.3 Respiratory viral panel is pending Chest x-ray reviewed by me shows pulmonary vascular congestion with diffuse bilateral interstitial prominence.  Findings suggestive of CHF with pulmonary edema. CT scan of the head without contrast/cervical spine shows no acute intracranial pathology.  No acute traumatic cervical spine pathology. CT scan of the chest without contrast shows predominantly bandlike  scarring and atelectasis of the bilateral lung bases.  No evidence of pleural effusion or other acute appearing airspace disease.  Cardiomegaly and coronary artery disease. Twelve-lead EKG reviewed by me shows atrial fibrillation.    ED Course: Patient is a 60 year old male who was brought into the ER by EMS for evaluation of worsening shortness of breath from his baseline. Per ER notes patient was hypoxic with room air pulse oximetry of 81 and was placed on 4 L of oxygen. Venous blood gas showed evidence of hypercapnia that was compensated.  BMP shows evidence of chronic CO2 retention. During evaluation patient was lethargic and unable to stay awake and appears to have CO2 narcosis. He was placed on a BiPAP and will be admitted to the hospital for further evaluation.    Review of Systems: As per HPI otherwise all other systems reviewed and negative.    Past Medical History:  Diagnosis Date   Allergy    Anxiety    Arthritis    Asthma    Brain damage    Chronic pain of both knees 07/13/2018   Clotting disorder (HCC)    COPD (chronic obstructive pulmonary disease) (HCC)    Depression    GERD (gastroesophageal reflux disease)    HFrEF (heart failure with reduced ejection fraction) ([place])    a. 03/2018 Echo: EF 25-30%, diff HK. Mod LAE.   History of DVT (deep vein thrombosis)    History of pulmonary embolism    a. Chronic coumadin.   Hypertension    MI (myocardial infarction) ([place])    Morbid obesity ([place])    Neck pain 07/13/2018   NICM (nonischemic cardiomyopathy) ([place])  a. s/p Cath x 3 - reportedly nl cors. Last cath 2019 in [place]; b. a. 03/2018 Echo: EF 25-30%, diff HK.   Persistent atrial fibrillation (HCC)    a. 03/2018 s/p DCCV; b. CHA2DS2VASc = 1-->Xarelto (later changed to warfarin); c. 05/2018 recurrent afib-->Amio initiated.   Sleep apnea    Sleep apnea     Past Surgical History:  Procedure Laterality Date   CARDIOVERSION N/A 03/24/2018   Procedure: CARDIOVERSION;  Surgeon:  Antonieta Iba, MD;  Location: [facility] ORS;  Service: Cardiovascular;  Laterality: N/A;   CARDIOVERSION N/A 08/08/2018   Procedure: CARDIOVERSION;  Surgeon: [name], MD;  Location: [facility] ORS;  Service: Cardiovascular;  Laterality: N/A;   hearnia repair     X 3- total of two surgeries   HERNIA REPAIR     LEG SURGERY       reports that he has never smoked. He has never used smokeless tobacco. He reports current alcohol use. He reports that he does not use drugs.  No Known Allergies  Family History  Problem Relation Age of Onset   Heart failure Mother    Lung cancer Mother    Lung cancer Father    Heart attack Maternal Grandmother    Heart attack Maternal Grandfather       Prior to Admission medications   Medication Sig Start Date End Date Taking? Authorizing Provider  acetaminophen (TYLENOL) 325 MG tablet Take 2 tablets (650 mg total) by mouth every 6 (six) hours as needed for mild pain, fever or headache. 11/06/20   Esaw Grandchild A, DO  albuterol (VENTOLIN HFA) 108 (90 Base) MCG/ACT inhaler Inhale 2 puffs into the lungs every 6 (six) hours as needed for wheezing or shortness of breath. 11/06/20   Pennie Banter, DO  allopurinol (ZYLOPRIM) 100 MG tablet Take 1 tablet (100 mg total) by mouth daily. 09/12/20 12/12/20  Lorin Glass, MD  ALPRAZolam Prudy Feeler) 0.5 MG tablet Take 1 tablet (0.5 mg total) by mouth every 6 (six) hours as needed for anxiety. 12/16/20   [place], Alvira Philips, DO  Amino Acids-Protein Hydrolys (FEEDING SUPPLEMENT, PRO-STAT SUGAR FREE 64,) LIQD Take 30 mLs by mouth 2 (two) times daily.    [provider]  amiodarone (PACERONE) 200 MG tablet Take 200 mg by mouth 2 (two) times daily. 12/01/20   [provider]  apixaban (ELIQUIS) 5 MG TABS tablet Take 5 mg by mouth in the morning and at bedtime. Was supposed to start 10/28 before hospital admission    [provider]  aspirin EC 81 MG tablet Take 81 mg by mouth daily. Swallow whole.     [provider]  atorvastatin (LIPITOR) 40 MG tablet Take 1 tablet (40 mg total) by mouth daily. 09/12/20 12/12/20  Lorin Glass, MD  calamine lotion Apply topically 2 (two) times daily. 11/06/20   Pennie Banter, DO  carvedilol (COREG) 6.25 MG tablet Take 1 tablet (6.25 mg total) by mouth 2 (two) times daily with a meal. 09/12/20 12/12/20  Dahal, Melina Schools, MD  cetirizine (ZYRTEC) 10 MG tablet Take 10 mg by mouth daily.    [provider]  colchicine 0.6 MG tablet Take 1 tablet (0.6 mg total) by mouth daily. 09/12/20 12/11/20  Lorin Glass, MD  fluticasone (FLONASE) 50 MCG/ACT nasal spray Place 1 spray into both nostrils daily.    [provider]  Fluticasone-Umeclidin-Vilant (TRELEGY ELLIPTA) 100-62.5-25 MCG/INH AEPB Inhale 1 Inhaler into the lungs daily. 09/27/20   Marrion Coy, MD  folic acid (  FOLVITE) 1 MG tablet Take 1 tablet (1 mg total) by mouth daily. 11/07/20   Ezekiel Slocumb, DO  gabapentin (NEURONTIN) 300 MG capsule Take 300 mg by mouth 2 (two) times daily.    [provider]  ipratropium-albuterol (DUONEB) 0.5-2.5 (3) MG/3ML SOLN Take 3 mLs by nebulization every 6 (six) hours as needed. 11/06/20   Ezekiel Slocumb, DO  levothyroxine (SYNTHROID) 75 MCG tablet Take 1 tablet (75 mcg total) by mouth daily before breakfast. 09/12/20 12/12/20  Dahal, Marlowe Aschoff, MD  lisinopril (ZESTRIL) 2.5 MG tablet Take 1 tablet (2.5 mg total) by mouth daily. 11/06/20   Ezekiel Slocumb, DO  loperamide (IMODIUM) 2 MG capsule Take 1 capsule (2 mg total) by mouth 4 (four) times daily as needed for diarrhea or loose stools. 11/06/20   Ezekiel Slocumb, DO  Multiple Vitamin (MULTIVITAMIN WITH MINERALS) TABS tablet Take 1 tablet by mouth daily.    [provider]  nitroGLYCERIN (NITROSTAT) 0.4 MG SL tablet Place 1 tablet under tongue every 5 minutes as needed for chest pain. (No more than 3 doses within 15 minutes) 03/08/19   Flinchum, Kelby Aline, FNP  nystatin  (MYCOSTATIN/NYSTOP) powder Apply 1 application topically 2 (two) times daily.    [provider]  oxyCODONE-acetaminophen (PERCOCET) 10-325 MG tablet Take 1 tablet by mouth every 6 (six) hours as needed for pain. 12/16/20   [place] Donnamarie Poag, DO  spironolactone (ALDACTONE) 25 MG tablet Take 0.5 tablets (12.5 mg total) by mouth daily. 09/13/20 12/12/20  Terrilee Croak, MD  torsemide (DEMADEX) 20 MG tablet Take 2 tablets (40 mg total) by mouth daily. 09/27/20 12/26/20  Sharen Hones, MD  traZODone (DESYREL) 100 MG tablet Take 1 tablet (100 mg total) by mouth at bedtime. 12/16/20   [place] Eric J, DO    Physical Exam: Vitals:   12/26/20 0830 12/26/20 0845 12/26/20 0846 12/26/20 1020  BP: (!) 108/53  (!) 108/53   Pulse: 95  66   Resp: (!) 22     Temp:      TempSrc:      SpO2: (!) 87% 93%  95%  Weight:         Vitals:   12/26/20 0830 12/26/20 0845 12/26/20 0846 12/26/20 1020  BP: (!) 108/53  (!) 108/53   Pulse: 95  66   Resp: (!) 22     Temp:      TempSrc:      SpO2: (!) 87% 93%  95%  Weight:          Constitutional: Lethargic and opens eyes to deep sternal rub, unable to stay awake long enough to provide any history. Not in any apparent distress.  Morbidly obese HEENT:      Head: Normocephalic and atraumatic.         Eyes: PERLA, EOMI, Conjunctivae are normal. Sclera is non-icteric.       Mouth/Throat: Mucous membranes are moist.       Neck: Supple with no signs of meningismus. Cardiovascular: Irregularly irregular. No murmurs, gallops, or rubs. 2+ symmetrical distal pulses are present . No JVD. 2+ LE edema Respiratory: Respiratory effort normal .crackles at the lung bases bilaterally. No wheezes or rhonchi.  Gastrointestinal: Soft, non tender, and non distended with positive bowel sounds.  Genitourinary: No CVA tenderness. Musculoskeletal: Nontender with normal range of motion in all extremities. No cyanosis, or erythema of extremities. Neurologic:  Unable to assess Skin:  Skin is warm, dry.  No rash or ulcers Psychiatric: Unable to  assess   Labs on Admission: I have personally reviewed following labs and imaging studies  CBC: Recent Labs  Lab 12/26/20 0730  WBC 8.3  NEUTROABS 5.9  HGB 9.5*  HCT 31.6*  MCV 95.5  PLT [id number]   Basic Metabolic Panel: Recent Labs  Lab 12/26/20 0730  NA 138  K 4.6  CL 92*  CO2 41*  GLUCOSE 120*  BUN 30*  CREATININE 0.94  CALCIUM 8.6*   GFR: Estimated Creatinine Clearance: 142.2 mL/min (by C-G formula based on SCr of 0.94 mg/dL). Liver Function Tests: Recent Labs  Lab 12/26/20 0730  AST 13*  ALT 11  ALKPHOS 44  BILITOT 0.6  PROT 5.5*  ALBUMIN 2.7*   Recent Labs  Lab 12/26/20 0730  LIPASE 32   No results for input(s): AMMONIA in the last 168 hours. Coagulation Profile: Recent Labs  Lab 12/26/20 0730  INR 1.3*   Cardiac Enzymes: No results for input(s): CKTOTAL, CKMB, CKMBINDEX, TROPONINI in the last 168 hours. BNP (last 3 results) No results for input(s): PROBNP in the last 8760 hours. HbA1C: No results for input(s): HGBA1C in the last 72 hours. CBG: No results for input(s): GLUCAP in the last 168 hours. Lipid Profile: No results for input(s): CHOL, HDL, LDLCALC, TRIG, CHOLHDL, LDLDIRECT in the last 72 hours. Thyroid Function Tests: No results for input(s): TSH, T4TOTAL, FREET4, T3FREE, THYROIDAB in the last 72 hours. Anemia Panel: No results for input(s): VITAMINB12, FOLATE, FERRITIN, TIBC, IRON, RETICCTPCT in the last 72 hours. Urine analysis:    Component Value Date/Time   COLORURINE YELLOW (A) 10/08/2020 1656   APPEARANCEUR HAZY (A) 10/08/2020 1656   APPEARANCEUR Hazy (A) 12/25/2018 1442   LABSPEC 1.019 10/08/2020 1656   PHURINE 5.0 10/08/2020 1656   GLUCOSEU NEGATIVE 10/08/2020 1656   HGBUR SMALL (A) 10/08/2020 1656   BILIRUBINUR NEGATIVE 10/08/2020 1656   BILIRUBINUR Negative 12/25/2018 1442   [place] 10/08/2020 1656   PROTEINUR 30 (A) 10/08/2020 1656   NITRITE  NEGATIVE 10/08/2020 1656   LEUKOCYTESUR TRACE (A) 10/08/2020 1656    Radiological Exams on Admission: CT HEAD WO CONTRAST (5MM)  Result Date: 12/25/2020 CLINICAL DATA:  Trauma. EXAM: CT HEAD WITHOUT CONTRAST CT CERVICAL SPINE WITHOUT CONTRAST TECHNIQUE: Multidetector CT imaging of the head and cervical spine was performed following the standard protocol without intravenous contrast. Multiplanar CT image reconstructions of the cervical spine were also generated. COMPARISON:  Head CT dated 12/12/2020. FINDINGS: Evaluation of this exam is limited due to motion artifact. CT HEAD FINDINGS Brain: The ventricles and sulci appropriate size for patient's age. The gray-white matter discrimination is preserved. There is no acute intracranial hemorrhage. No mass effect or midline shift. No extra-axial fluid collection. Vascular: No hyperdense vessel or unexpected calcification. Skull: Normal. Negative for fracture or focal lesion. Sinuses/Orbits: There is partial opacification of the left mastoid air cells. The visualized paranasal sinuses and the right mastoid air cells are clear. Other: None CT CERVICAL SPINE FINDINGS Evaluation of the cervical spine is very limited due to body habitus and streak artifact. Alignment: No acute subluxation. There is straightening of normal cervical lordosis which may be positional or due to muscle spasm. Skull base and vertebrae: No acute fracture identified. Soft tissues and spinal canal: No prevertebral fluid or swelling. No visible canal hematoma. Disc levels:  Multilevel degenerative changes. Upper chest: Negative. Other: None IMPRESSION: 1. No acute intracranial pathology. 2. No acute/traumatic cervical spine pathology. Evaluation of the cervical spine is very limited due to body habitus and  streak artifact. Electronically Signed   By: Anner Crete M.D.   On: 12/25/2020 00:51   CT CHEST WO CONTRAST  Result Date: 12/26/2020 CLINICAL DATA:  Shortness of breath, pneumonia,  effusion or abscess suspected EXAM: CT CHEST WITHOUT CONTRAST TECHNIQUE: Multidetector CT imaging of the chest was performed following the standard protocol without IV contrast. COMPARISON:  10/09/2020 FINDINGS: Cardiovascular: Scattered aortic atherosclerosis. Cardiomegaly. Three-vessel coronary artery calcifications. No pericardial effusion. Mediastinum/Nodes: No enlarged mediastinal, hilar, or axillary lymph nodes. Thyroid gland, trachea, and esophagus demonstrate no significant findings. Lungs/Pleura: Predominantly bandlike scarring and or atelectasis of the bilateral lung bases. No pleural effusion or pneumothorax. Upper Abdomen: No acute abnormality. Musculoskeletal: No chest wall mass or suspicious bone lesions identified. IMPRESSION: 1. Predominantly bandlike scarring and or atelectasis of the bilateral lung bases. No evidence of pleural effusion or other acute appearing airspace disease. 2. Cardiomegaly and coronary artery disease. Aortic Atherosclerosis (ICD10-I70.0). Electronically Signed   By: Delanna Ahmadi M.D.   On: 12/26/2020 10:21   CT Cervical Spine Wo Contrast  Result Date: 12/25/2020 CLINICAL DATA:  Trauma. EXAM: CT HEAD WITHOUT CONTRAST CT CERVICAL SPINE WITHOUT CONTRAST TECHNIQUE: Multidetector CT imaging of the head and cervical spine was performed following the standard protocol without intravenous contrast. Multiplanar CT image reconstructions of the cervical spine were also generated. COMPARISON:  Head CT dated 12/12/2020. FINDINGS: Evaluation of this exam is limited due to motion artifact. CT HEAD FINDINGS Brain: The ventricles and sulci appropriate size for patient's age. The gray-white matter discrimination is preserved. There is no acute intracranial hemorrhage. No mass effect or midline shift. No extra-axial fluid collection. Vascular: No hyperdense vessel or unexpected calcification. Skull: Normal. Negative for fracture or focal lesion. Sinuses/Orbits: There is partial  opacification of the left mastoid air cells. The visualized paranasal sinuses and the right mastoid air cells are clear. Other: None CT CERVICAL SPINE FINDINGS Evaluation of the cervical spine is very limited due to body habitus and streak artifact. Alignment: No acute subluxation. There is straightening of normal cervical lordosis which may be positional or due to muscle spasm. Skull base and vertebrae: No acute fracture identified. Soft tissues and spinal canal: No prevertebral fluid or swelling. No visible canal hematoma. Disc levels:  Multilevel degenerative changes. Upper chest: Negative. Other: None IMPRESSION: 1. No acute intracranial pathology. 2. No acute/traumatic cervical spine pathology. Evaluation of the cervical spine is very limited due to body habitus and streak artifact. Electronically Signed   By: Anner Crete M.D.   On: 12/25/2020 00:51   DG Chest Port 1 View  Result Date: 12/26/2020 CLINICAL DATA:  Shortness of breath EXAM: PORTABLE CHEST 1 VIEW COMPARISON:  12/12/2020 FINDINGS: Stable cardiomegaly. Pulmonary vascular congestion with diffuse bilateral interstitial prominence, more confluent within the perihilar regions. No large pleural fluid collection. No pneumothorax. IMPRESSION: Findings suggestive of CHF with pulmonary edema. A superimposed infectious process would be difficult to exclude. Electronically Signed   By: Davina Poke D.O.   On: 12/26/2020 08:08     Assessment/Plan Principal Problem:   Acute on chronic respiratory failure with hypoxia and hypercapnia (HCC) Active Problems:   HTN (hypertension)   Morbid obesity with BMI of 60.0-69.9, adult (HCC)   Class 3 obesity with alveolar hypoventilation, serious comorbidity, and body mass index (BMI) of 60.0 to 69.9 in adult [facility])   Atrial fibrillation (HCC)   Acute on chronic combined systolic (congestive) and diastolic (congestive) heart failure [facility])     Patient is a 60 year old male who resides  in a skilled  nursing facility and was brought into the ER by EMS for evaluation of shortness of breath.  During my evaluation patient was found to be lethargic arterial blood gas shows hypercapnia.    Acute on chronic respiratory failure with hypercapnia and hypoxia Patient has a history of chronic respiratory failure as well as obesity hypoventilation syndrome and at baseline is on 3 L of oxygen Per EMS he had room air pulse oximetry of 81% and was placed on 4 L of oxygen Initial VBG showed hypercapnia that was compensated and a PO2 of 121 Patient is currently lethargic and difficult to arouse consistent with CO2 narcosis Will place patient on BiPAP and repeat an ABG in 2 hours Will attempt to wean off BiPAP as tolerated    Acute COPD exacerbation Continue as needed and scheduled bronchodilator therapy Place patient on inhaled steroids Continue oxygen supplementation      Permanent A. fib on Coumadin Continue amiodarone and carvedilol for rate control Continue apixaban as primary prophylaxis for an acute stroke     Severe morbid obesity BMI 62 Complicates overall prognosis and care    Chronic combined systolic and diastolic dysfunction CHF Last known LVEF is 25 to 30% from a 2D echocardiogram done 10/22 Not acutely exacerbated Continue carvedilol, torsemide and lisinopril     Chronic anxiety/depression Hold Xanax for now Continue trazodone     Hypothyroidism Continue levothyroxine      Gout Continue allopurinol and colchicine       DVT prophylaxis: Apixaban Code Status: DO NOT RESUSCITATE Family Communication: Attempted to call patient's sister Jaspal Mullan over the phone. Awaiting callback. Patient has a universal DNR.  Nursing home staff Disposition Plan: Back to previous home environment Consults called: none  Status:At the time of admission, it appears that the appropriate admission status for this patient is inpatient. This is judged to be reasonable and  necessary to provide the required intensity of service to ensure the patient's safety given the presenting symptoms, physical exam findings, and initial radiographic and laboratory data in the context of their comorbid conditions. Patient requires inpatient status due to high intensity of service, high risk for further deterioration and high frequency of surveillance required.     Collier Bullock MD [facility]     12/26/2020, 11:12 AM

## 2020-12-26 NOTE — ED Notes (Signed)
Lab contacted to collect cultures d/t difficult stick

## 2020-12-26 NOTE — ED Triage Notes (Signed)
Pt to ED [facility] from [facility] for shob and possible rectal bleed. Pt 85% on RA. Placed on 3L [facility]. Per pt wears 3L [place] [facility]ic 81% RA on arrival   C/o back pain
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
## 2020-12-26 NOTE — ED Notes (Signed)
Pt ringing call bell multiple times asking for tv remote. Remote provided.

## 2020-12-26 NOTE — ED Notes (Signed)
Dr Darnelle Catalan notified of starting antibiotics before cultures

## 2020-12-26 NOTE — ED Notes (Signed)
Pt taking off bp cuff and pulse ox, placed back on pt and advised not to take off

## 2020-12-27 DIAGNOSIS — J441 Chronic obstructive pulmonary disease with (acute) exacerbation: Secondary | ICD-10-CM

## 2020-12-27 DIAGNOSIS — E038 Other specified hypothyroidism: Secondary | ICD-10-CM

## 2020-12-27 DIAGNOSIS — I509 Heart failure, unspecified: Secondary | ICD-10-CM

## 2020-12-27 DIAGNOSIS — L8990 Pressure ulcer of unspecified site, unspecified stage: Secondary | ICD-10-CM | POA: Insufficient documentation

## 2020-12-27 DIAGNOSIS — E662 Morbid (severe) obesity with alveolar hypoventilation: Secondary | ICD-10-CM | POA: Diagnosis not present

## 2020-12-27 DIAGNOSIS — Z6841 Body Mass Index (BMI) 40.0 and over, adult: Secondary | ICD-10-CM

## 2020-12-27 DIAGNOSIS — J9621 Acute and chronic respiratory failure with hypoxia: Secondary | ICD-10-CM | POA: Diagnosis not present

## 2020-12-27 LAB — BASIC METABOLIC PANEL
Anion gap: 7 (ref 5–15)
BUN: 14 mg/dL (ref 6–20)
CO2: 40 mmol/L — ABNORMAL HIGH (ref 22–32)
Calcium: 9.1 mg/dL (ref 8.9–10.3)
Chloride: 93 mmol/L — ABNORMAL LOW (ref 98–111)
Creatinine, Ser: 0.72 mg/dL (ref 0.61–1.24)
GFR, Estimated: >60 mL/min (ref >60)
Glucose, Bld: 115 mg/dL — ABNORMAL HIGH (ref 70–99)
Potassium: 4.4 mmol/L (ref 3.5–5.1)
Sodium: 140 mmol/L (ref 135–145)

## 2020-12-27 LAB — CBC
HCT: 35.3 % — ABNORMAL LOW (ref 39.0–52.0)
Hemoglobin: 10.4 g/dL — ABNORMAL LOW (ref 13.0–17.0)
MCH: 28.2 pg (ref 26.0–34.0)
MCHC: 29.5 g/dL — ABNORMAL LOW (ref 30.0–36.0)
MCV: 95.7 fL (ref 80.0–100.0)
Platelets: 211 10*3/uL (ref 150–400)
RBC: 3.69 MIL/uL — ABNORMAL LOW (ref 4.22–5.81)
RDW: 16.2 % — ABNORMAL HIGH (ref 11.5–15.5)
WBC: 6.4 10*3/uL (ref 4.0–10.5)
nRBC: 0 % (ref 0.0–0.2)

## 2020-12-27 LAB — MAGNESIUM: Magnesium: 1.7 mg/dL (ref 1.7–2.4)

## 2020-12-27 LAB — TSH: TSH: 18.799 u[IU]/mL — ABNORMAL HIGH (ref 0.350–4.500)

## 2020-12-27 MED ORDER — PSEUDOEPHEDRINE HCL 30 MG PO TABS: 30.0000 mg | ORAL_TABLET | Freq: Three times a day (TID) | ORAL | 0 refills | Status: AC | PRN

## 2020-12-27 MED ORDER — PREDNISONE 10 MG (21) PO TBPK: 20.0000 mg | ORAL_TABLET | Freq: Every evening | ORAL | Status: DC

## 2020-12-27 MED ORDER — PREDNISONE 10 MG (21) PO TBPK: 10.0000 mg | ORAL_TABLET | ORAL | Status: DC

## 2020-12-27 MED ORDER — LEVOTHYROXINE SODIUM 50 MCG PO TABS
75.0000 ug | ORAL_TABLET | Freq: Once | ORAL | Status: AC
  Administered 2020-12-27: 75 ug via ORAL
  Filled 2020-12-27: qty 1

## 2020-12-27 MED ORDER — LEVOTHYROXINE SODIUM 125 MCG PO TABS: 125.0000 ug | ORAL_TABLET | Freq: Every day | ORAL | Status: AC

## 2020-12-27 MED ORDER — IPRATROPIUM BROMIDE 0.03 % NA SOLN
2.0000 | Freq: Two times a day (BID) | NASAL | Status: DC
  Administered 2020-12-27: 2 via NASAL
  Filled 2020-12-27: qty 30

## 2020-12-27 MED ORDER — PREDNISONE 10 MG (21) PO TBPK
20.0000 mg | ORAL_TABLET | Freq: Every morning | ORAL | Status: AC
  Administered 2020-12-27: 20 mg via ORAL
  Filled 2020-12-27 (×2): qty 21

## 2020-12-27 MED ORDER — PREDNISONE 10 MG (21) PO TBPK: 10.0000 mg | ORAL_TABLET | Freq: Three times a day (TID) | ORAL | Status: DC

## 2020-12-27 MED ORDER — SODIUM CHLORIDE 0.9 % IV SOLN
1.0000 g | INTRAVENOUS | Status: DC
  Administered 2020-12-27: 1 g via INTRAVENOUS
  Filled 2020-12-27: qty 1
  Filled 2020-12-27: qty 10

## 2020-12-27 MED ORDER — AZITHROMYCIN 250 MG PO TABS
500.0000 mg | ORAL_TABLET | Freq: Every day | ORAL | Status: DC
  Administered 2020-12-27: 500 mg via ORAL
  Filled 2020-12-27: qty 2

## 2020-12-27 MED ORDER — IPRATROPIUM BROMIDE 0.03 % NA SOLN: 2.0000 | Freq: Two times a day (BID) | NASAL | 0 refills | Status: AC

## 2020-12-27 MED ORDER — PREDNISONE 10 MG (21) PO TBPK: 10.0000 mg | ORAL_TABLET | Freq: Four times a day (QID) | ORAL | Status: DC

## 2020-12-27 MED ORDER — LEVOTHYROXINE SODIUM 25 MCG PO TABS: 125.0000 ug | ORAL_TABLET | Freq: Every day | ORAL | Status: DC

## 2020-12-27 MED ORDER — PSEUDOEPHEDRINE HCL 30 MG PO TABS
30.0000 mg | ORAL_TABLET | Freq: Every day | ORAL | Status: DC
  Administered 2020-12-27: 30 mg via ORAL
  Filled 2020-12-27: qty 1

## 2020-12-27 NOTE — Progress Notes (Signed)
Discharge report called to [facility]/ iv and tele removed/ EMS to transport

## 2020-12-27 NOTE — Discharge Instructions (Signed)
I have prescribed a nasal spray and medication to help with your sinus congestion.  During this admission, I saw that your thyroid medication was not strong enough and can be the reason why you have had increased fatigue over the past 6 months. I have increased the dose. Please follow up with your primary doctor in 4-6 weeks to recheck your thyroid levels (TSH).
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
## 2020-12-27 NOTE — Discharge Summary (Signed)
Physician Discharge Summary  [name] [name] [id number] DOB: 08-19-1960 DOA: 12/26/2020  PCP: [name], DO  Admit date: 12/26/2020 Discharge date: 12/27/2020  Admitted From: SNF Disposition: Skilled nursing facility  Recommendations for Outpatient Follow-up:  Follow up with PCP within 4-6 weeks to recheck TSH as it was elevated at admission. His levothyroxine dose was increased to 122mcg on discharge.  Discharge Condition:good, stable CODE STATUS:  Code Status: DNR  Regular healthy diet  Brief/Interim Summary: Pt reported to be admitted for acute respiratory failure but he has been on his home oxygen requirement of 4L and does not complain of any respiratory distress today. He states he is on his home 4L baseline with main complaint of sinus congestion which was not improved with flonase. He was speaking in full paragraphs and had no dyspnea, cough, or increased work of breathing on exam today. He was treated with nasal decongestants of sudafed and atrovent nasal spray and continued on his home breathing treatments.  Additionally, patient endorses 6 months of generalized fatigue similar to previous episodes of anemia but his hgb is at baseline and denies bleeding source. I checked a TSH which was elevated >18. Increased his levothyroxine dose to 166mcg and he should follow up with PCP for recheck.  Recommend weight loss and physical therapy/exercise.  He was discharged in stable condition on his other home medications.    Discharge Diagnoses:  Principal Problem:   Acute on chronic respiratory failure with hypoxia and hypercapnia (HCC) Active Problems:   HTN (hypertension)   Morbid obesity with BMI of 60.0-69.9, adult (HCC)   Class 3 obesity with alveolar hypoventilation, serious comorbidity, and body mass index (BMI) of 60.0 to 69.9 in adult [facility])   Atrial fibrillation (HCC)   Acute on chronic combined systolic (congestive) and diastolic (congestive) heart  failure (HCC)   Pressure injury of skin   Discharge Instructions     Discharge patient   Complete by: As directed    Discharge disposition: 03-Skilled Nursing Facility   Discharge patient date: 12/27/2020      Allergies as of 12/27/2020   No Known Allergies      Medication List     STOP taking these medications    ALPRAZolam 0.5 MG tablet Commonly known as: XANAX   Cephalexin 500 MG tablet   loperamide 2 MG capsule Commonly known as: IMODIUM   traZODone 100 MG tablet Commonly known as: DESYREL       TAKE these medications    acetaminophen 325 MG tablet Commonly known as: TYLENOL Take 2 tablets (650 mg total) by mouth every 6 (six) hours as needed for mild pain, fever or headache.   albuterol 108 (90 Base) MCG/ACT inhaler Commonly known as: VENTOLIN HFA Inhale 2 puffs into the lungs every 6 (six) hours as needed for wheezing or shortness of breath.   allopurinol 100 MG tablet Commonly known as: ZYLOPRIM Take 1 tablet (100 mg total) by mouth daily.   amiodarone 200 MG tablet Commonly known as: PACERONE Take 200 mg by mouth 2 (two) times daily.   apixaban 5 MG Tabs tablet Commonly known as: ELIQUIS Take 5 mg by mouth in the morning and at bedtime. Was supposed to start 10/28 before hospital admission   aspirin EC 81 MG tablet Take 81 mg by mouth daily. Swallow whole.   atorvastatin 40 MG tablet Commonly known as: LIPITOR Take 1 tablet (40 mg total) by mouth daily.   calamine lotion Apply topically 2 (two) times daily.  carvedilol 6.25 MG tablet Commonly known as: COREG Take 1 tablet (6.25 mg total) by mouth 2 (two) times daily with a meal.   cetirizine 10 MG tablet Commonly known as: ZYRTEC Take 10 mg by mouth daily.   colchicine 0.6 MG tablet Take 1 tablet (0.6 mg total) by mouth daily.   feeding supplement (PRO-STAT SUGAR FREE 64) Liqd Take 30 mLs by mouth 2 (two) times daily.   fluticasone 50 MCG/ACT nasal spray Commonly known as:  FLONASE Place 1 spray into both nostrils daily.   folic acid 1 MG tablet Commonly known as: FOLVITE Take 1 tablet (1 mg total) by mouth daily.   gabapentin 300 MG capsule Commonly known as: NEURONTIN Take 300 mg by mouth 2 (two) times daily.   ipratropium 0.03 % nasal spray Commonly known as: ATROVENT Place 2 sprays into both nostrils 2 (two) times daily for 5 days.   ipratropium-albuterol 0.5-2.5 (3) MG/3ML Soln Commonly known as: DUONEB Take 3 mLs by nebulization every 6 (six) hours as needed.   levothyroxine 125 MCG tablet Commonly known as: SYNTHROID Take 1 tablet (125 mcg total) by mouth daily at 6 (six) AM. Start taking on: December 28, 2020 What changed:  medication strength how much to take when to take this   lisinopril 2.5 MG tablet Commonly known as: ZESTRIL Take 1 tablet (2.5 mg total) by mouth daily.   multivitamin with minerals Tabs tablet Take 1 tablet by mouth daily.   nitroGLYCERIN 0.4 MG SL tablet Commonly known as: NITROSTAT Place 1 tablet under tongue every 5 minutes as needed for chest pain. (No more than 3 doses within 15 minutes)   nystatin powder Commonly known as: MYCOSTATIN/NYSTOP Apply 1 application topically 2 (two) times daily.   oxyCODONE-acetaminophen 10-325 MG tablet Commonly known as: PERCOCET Take 1 tablet by mouth every 6 (six) hours as needed for pain.   pseudoephedrine 30 MG tablet Commonly known as: SUDAFED Take 1 tablet (30 mg total) by mouth every 8 (eight) hours as needed for up to 5 days for congestion.   spironolactone 25 MG tablet Commonly known as: ALDACTONE Take 0.5 tablets (12.5 mg total) by mouth daily.   torsemide 20 MG tablet Commonly known as: DEMADEX Take 2 tablets (40 mg total) by mouth daily.   Trelegy Ellipta 100-62.5-25 MCG/ACT Aepb Generic drug: Fluticasone-Umeclidin-Vilant Inhale 1 Inhaler into the lungs daily.        No Known Allergies  Consultations: none  Procedures/Studies: CT HEAD  WO CONTRAST (5MM)  Result Date: 12/25/2020 CLINICAL DATA:  Trauma. EXAM: CT HEAD WITHOUT CONTRAST CT CERVICAL SPINE WITHOUT CONTRAST TECHNIQUE: Multidetector CT imaging of the head and cervical spine was performed following the standard protocol without intravenous contrast. Multiplanar CT image reconstructions of the cervical spine were also generated. COMPARISON:  Head CT dated 12/12/2020. FINDINGS: Evaluation of this exam is limited due to motion artifact. CT HEAD FINDINGS Brain: The ventricles and sulci appropriate size for patient's age. The gray-white matter discrimination is preserved. There is no acute intracranial hemorrhage. No mass effect or midline shift. No extra-axial fluid collection. Vascular: No hyperdense vessel or unexpected calcification. Skull: Normal. Negative for fracture or focal lesion. Sinuses/Orbits: There is partial opacification of the left mastoid air cells. The visualized paranasal sinuses and the right mastoid air cells are clear. Other: None CT CERVICAL SPINE FINDINGS Evaluation of the cervical spine is very limited due to body habitus and streak artifact. Alignment: No acute subluxation. There is straightening of normal cervical lordosis which may  be positional or due to muscle spasm. Skull base and vertebrae: No acute fracture identified. Soft tissues and spinal canal: No prevertebral fluid or swelling. No visible canal hematoma. Disc levels:  Multilevel degenerative changes. Upper chest: Negative. Other: None IMPRESSION: 1. No acute intracranial pathology. 2. No acute/traumatic cervical spine pathology. Evaluation of the cervical spine is very limited due to body habitus and streak artifact. Electronically Signed   By: Elgie Collard M.D.   On: 12/25/2020 00:51   CT HEAD WO CONTRAST ( )  Result Date: 12/12/2020 CLINICAL DATA:  Altered level of consciousness, shortness of breath EXAM: CT HEAD WITHOUT CONTRAST TECHNIQUE: Contiguous axial images were obtained from the  base of the skull through the vertex without intravenous contrast. COMPARISON:  07/10/2019 FINDINGS: Brain: No acute infarct or hemorrhage. Lateral ventricles and midline structures are unremarkable. No acute extra-axial fluid collections. No mass effect. Vascular: No hyperdense vessel or unexpected calcification. Skull: Normal. Negative for fracture or focal lesion. Sinuses/Orbits: Left mastoid effusion. Paranasal sinuses are unremarkable. Other: None. IMPRESSION: 1. No acute intracranial process. 2. Left mastoid effusion. Electronically Signed   By: Sharlet Salina M.D.   On: 12/12/2020 17:55   CT CHEST WO CONTRAST  Result Date: 12/26/2020 CLINICAL DATA:  Shortness of breath, pneumonia, effusion or abscess suspected EXAM: CT CHEST WITHOUT CONTRAST TECHNIQUE: Multidetector CT imaging of the chest was performed following the standard protocol without IV contrast. COMPARISON:  10/09/2020 FINDINGS: Cardiovascular: Scattered aortic atherosclerosis. Cardiomegaly. Three-vessel coronary artery calcifications. No pericardial effusion. Mediastinum/Nodes: No enlarged mediastinal, hilar, or axillary lymph nodes. Thyroid gland, trachea, and esophagus demonstrate no significant findings. Lungs/Pleura: Predominantly bandlike scarring and or atelectasis of the bilateral lung bases. No pleural effusion or pneumothorax. Upper Abdomen: No acute abnormality. Musculoskeletal: No chest wall mass or suspicious bone lesions identified. IMPRESSION: 1. Predominantly bandlike scarring and or atelectasis of the bilateral lung bases. No evidence of pleural effusion or other acute appearing airspace disease. 2. Cardiomegaly and coronary artery disease. Aortic Atherosclerosis (ICD10-I70.0). Electronically Signed   By: Jearld Lesch M.D.   On: 12/26/2020 10:21   CT Cervical Spine Wo Contrast  Result Date: 12/25/2020 CLINICAL DATA:  Trauma. EXAM: CT HEAD WITHOUT CONTRAST CT CERVICAL SPINE WITHOUT CONTRAST TECHNIQUE: Multidetector CT  imaging of the head and cervical spine was performed following the standard protocol without intravenous contrast. Multiplanar CT image reconstructions of the cervical spine were also generated. COMPARISON:  Head CT dated 12/12/2020. FINDINGS: Evaluation of this exam is limited due to motion artifact. CT HEAD FINDINGS Brain: The ventricles and sulci appropriate size for patient's age. The gray-white matter discrimination is preserved. There is no acute intracranial hemorrhage. No mass effect or midline shift. No extra-axial fluid collection. Vascular: No hyperdense vessel or unexpected calcification. Skull: Normal. Negative for fracture or focal lesion. Sinuses/Orbits: There is partial opacification of the left mastoid air cells. The visualized paranasal sinuses and the right mastoid air cells are clear. Other: None CT CERVICAL SPINE FINDINGS Evaluation of the cervical spine is very limited due to body habitus and streak artifact. Alignment: No acute subluxation. There is straightening of normal cervical lordosis which may be positional or due to muscle spasm. Skull base and vertebrae: No acute fracture identified. Soft tissues and spinal canal: No prevertebral fluid or swelling. No visible canal hematoma. Disc levels:  Multilevel degenerative changes. Upper chest: Negative. Other: None IMPRESSION: 1. No acute intracranial pathology. 2. No acute/traumatic cervical spine pathology. Evaluation of the cervical spine is very limited due to body habitus and  streak artifact. Electronically Signed   By: Anner Crete M.D.   On: 12/25/2020 00:51   DG Chest Port 1 View  Result Date: 12/26/2020 CLINICAL DATA:  Shortness of breath EXAM: PORTABLE CHEST 1 VIEW COMPARISON:  12/12/2020 FINDINGS: Stable cardiomegaly. Pulmonary vascular congestion with diffuse bilateral interstitial prominence, more confluent within the perihilar regions. No large pleural fluid collection. No pneumothorax. IMPRESSION: Findings suggestive of  CHF with pulmonary edema. A superimposed infectious process would be difficult to exclude. Electronically Signed   By: Davina Poke D.O.   On: 12/26/2020 08:08   DG Chest Portable 1 View  Result Date: 12/12/2020 CLINICAL DATA:  Shortness of breath. EXAM: PORTABLE CHEST 1 VIEW COMPARISON:  November 03, 2020. FINDINGS: Stable cardiomegaly. Mild bibasilar subsegmental atelectasis is noted. Small left pleural effusion may be present. Bony thorax is unremarkable. IMPRESSION: Mild bibasilar subsegmental atelectasis. Possible small left pleural effusion. Electronically Signed   By: Marijo Conception M.D.   On: 12/12/2020 16:28   ECHOCARDIOGRAM COMPLETE  Result Date: 12/13/2020    ECHOCARDIOGRAM REPORT   Patient Name:   [name] [name] Date of Exam: 12/13/2020 Medical Rec #:  [id number]              Height:       69.0 in Accession #:    [id number]             Weight:       419.2 lb Date of Birth:  14-Oct-1960              BSA:          2.830 m Patient Age:    48 years               BP:           136/54 mmHg Patient Gender: M                      HR:           63 bpm. Exam Location:  [facility] Procedure: 2D Echo, Cardiac Doppler, Color Doppler and Intracardiac            Opacification Agent Indications:     CHF-Acute Systolic [id number] / [id number]  History:         Patient has prior history of Echocardiogram examinations. CHF;                  Risk Factors:Hypertension. MI.  Sonographer:     Alyse Low Roar Referring Phys:  [place] Diagnosing Phys: Skeet Latch MD IMPRESSIONS  1. Left ventricular ejection fraction, by estimation, is 25 to 30%. The left ventricle has severely decreased function. The left ventricle demonstrates global hypokinesis. The left ventricular internal cavity size was moderately dilated. There is mild left ventricular hypertrophy of the basal-septal segment. Left ventricular diastolic parameters are indeterminate.  2. Right ventricular systolic function is normal. The right  ventricular size is normal.  3. The mitral valve is normal in structure. No evidence of mitral valve regurgitation. No evidence of mitral stenosis.  4. The aortic valve is normal in structure. Aortic valve regurgitation is not visualized. No aortic stenosis is present. Comparison(s): Essentially unchanged from 09/2020. FINDINGS  Left Ventricle: Left ventricular ejection fraction, by estimation, is 25 to 30%. The left ventricle has severely decreased function. The left ventricle demonstrates global hypokinesis. The left ventricular internal cavity size was moderately dilated. There is mild left ventricular hypertrophy of the basal-septal  segment. Left ventricular diastolic parameters are indeterminate. Right Ventricle: The right ventricular size is normal. No increase in right ventricular wall thickness. Right ventricular systolic function is normal. Left Atrium: Left atrial size was normal in size. Right Atrium: Right atrial size was normal in size. Pericardium: There is no evidence of pericardial effusion. Mitral Valve: The mitral valve is normal in structure. No evidence of mitral valve regurgitation. No evidence of mitral valve stenosis. Tricuspid Valve: The tricuspid valve is normal in structure. Tricuspid valve regurgitation is trivial. No evidence of tricuspid stenosis. Aortic Valve: The aortic valve is normal in structure. Aortic valve regurgitation is not visualized. No aortic stenosis is present. Aortic valve peak gradient measures 9.9 mmHg. Pulmonic Valve: The pulmonic valve was normal in structure. Pulmonic valve regurgitation is not visualized. No evidence of pulmonic stenosis. Aorta: The aortic root is normal in size and structure. Venous: The inferior vena cava was not well visualized. IAS/Shunts: No atrial level shunt detected by color flow Doppler. Additional Comments: Study is limited by poor endomyocardial border definition despite using Definity contrast.  LEFT VENTRICLE PLAX 2D LVIDd:         6.40  cm   Diastology LVIDs:         5.50 cm   LV e' medial:    12.70 cm/s LV PW:         1.00 cm   LV E/e' medial:  8.6 LV IVS:        1.10 cm   LV e' lateral:   13.50 cm/s LVOT diam:     1.90 cm   LV E/e' lateral: 8.1 LVOT Area:     2.84 cm  RIGHT VENTRICLE RV Mid diam:    3.50 cm RV S prime:     9.37 cm/s TAPSE (M-mode): 1.8 cm LEFT ATRIUM         Index LA diam:    5.60 cm 1.98 cm/m  AORTIC VALVE                 PULMONIC VALVE AV Area (Vmax): 1.57 cm     PV Vmax:        1.03 m/s AV Vmax:        157.00 cm/s  PV Peak grad:   4.2 mmHg AV Peak Grad:   9.9 mmHg     RVOT Peak grad: 2 mmHg LVOT Vmax:      86.80 cm/s  AORTA Ao Root diam: 3.40 cm MITRAL VALVE                TRICUSPID VALVE MV Area (PHT): 5.02 cm     TR Peak grad:   33.6 mmHg MV Decel Time: 151 msec     TR Vmax:        290.00 cm/s MV E velocity: 109.00 cm/s                             SHUNTS                             Systemic Diam: 1.90 cm Skeet Latch MD Electronically signed by Skeet Latch MD Signature Date/Time: 12/13/2020/2:54:05 PM    Final     Subjective: Patient denies SOB, CP, increase in baseline 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 He is on home 4L O2. Complains of sinus congestion and generalized fatigue present for 6 months. Typically has only very mild tolerance  of exertion with a walker and this has not changed recently.   Discharge Exam: Vitals:   12/27/20 0815 12/27/20 1205  BP: (!) 120/53 133/76  Pulse: 79 74  Resp: 19 18  Temp: 98.9 F (37.2 C) 99.1 F (37.3 C)  SpO2: 100% 99%    General: Pt is alert, awake, not in acute distress. Laying flat on back. Obese.  Cardiovascular: RRR, S1/S2 +, no rubs, no gallops Respiratory: CTA bilaterally, no wheezing, no rhonchi. Normal effort. Abdominal: Soft, NT, ND, bowel sounds + Extremities: non-pitting edema, no cyanosis  Labs: Basic Metabolic Panel: Recent Labs  Lab 12/26/20 0730 12/27/20 0612  NA 138 140  K 4.6 4.4  CL 92* 93*  CO2 41* 40*  GLUCOSE 120*  115*  BUN 30* 14  CREATININE 0.94 0.72  CALCIUM 8.6* 9.1  MG  --  1.7   CBC: Recent Labs  Lab 12/26/20 0730 12/27/20 0612  WBC 8.3 6.4  NEUTROABS 5.9  --   HGB 9.5* 10.4*  HCT 31.6* 35.3*  MCV 95.5 95.7  PLT 223 211    Microbiology Recent Results (from the past 240 hour(s))  Resp Panel by RT-PCR (Flu A&B, Covid) Nasopharyngeal Swab     Status: None   Collection Time: 12/26/20  7:30 AM   Specimen: Nasopharyngeal Swab; Nasopharyngeal(NP) swabs in vial transport medium  Result Value Ref Range Status   SARS Coronavirus 2 by RT PCR NEGATIVE NEGATIVE Final    Comment: (NOTE) SARS-CoV-2 target nucleic acids are NOT DETECTED.  The SARS-CoV-2 RNA is generally detectable in upper respiratory specimens during the acute phase of infection. The lowest concentration of SARS-CoV-2 viral copies this assay can detect is 138 copies/mL. A negative result does not preclude SARS-Cov-2 infection and should not be used as the sole basis for treatment or other patient management decisions. A negative result may occur with  improper specimen collection/handling, submission of specimen other than nasopharyngeal swab, presence of viral mutation(s) within the areas targeted by this assay, and inadequate number of viral copies(<138 copies/mL). A negative result must be combined with clinical observations, patient history, and epidemiological information. The expected result is Negative.  Fact Sheet for Patients:  BloggerCourse.com  Fact Sheet for Healthcare Providers:  SeriousBroker.it  This test is no t yet approved or cleared by the [place] FDA and  has been authorized for detection and/or diagnosis of SARS-CoV-2 by FDA under an Emergency Use Authorization (EUA). This EUA will remain  in effect (meaning this test can be used) for the duration of the COVID-19 declaration under Section 564(b)(1) of the Act, 21 U.S.C.section  360bbb-3(b)(1), unless the authorization is terminated  or revoked sooner.       Influenza A by PCR NEGATIVE NEGATIVE Final   Influenza B by PCR NEGATIVE NEGATIVE Final    Comment: (NOTE) The Xpert Xpress SARS-CoV-2/FLU/RSV plus assay is intended as an aid in the diagnosis of influenza from Nasopharyngeal swab specimens and should not be used as a sole basis for treatment. Nasal washings and aspirates are unacceptable for Xpert Xpress SARS-CoV-2/FLU/RSV testing.  Fact Sheet for Patients: BloggerCourse.com  Fact Sheet for Healthcare Providers: SeriousBroker.it  This test is not yet approved or cleared by the [place] FDA and has been authorized for detection and/or diagnosis of SARS-CoV-2 by FDA under an Emergency Use Authorization (EUA). This EUA will remain in effect (meaning this test can be used) for the duration of the COVID-19 declaration under Section 564(b)(1) of the Act, 21 U.S.C. section 360bbb-3(b)(1),  unless the authorization is terminated or revoked.  Performed at [facility], [address] [place], [place] [address]     Time coordinating discharge: Over 30 minutes  Richarda Osmond, MD  [facility] 12/27/2020, 12:19 PM Pager   If 7PM-7AM, please contact night-coverage [email or web address] Password [email or web address]
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
## 2020-12-28 LAB — TROPONIN I (HIGH SENSITIVITY): Troponin I (High Sensitivity): 22 ng/L — ABNORMAL HIGH (ref <18)

## 2021-01-02 ENCOUNTER — Ambulatory Visit: Payer: Medicaid Other | Admitting: Family

## 2021-01-04 DIAGNOSIS — G4733 Obstructive sleep apnea (adult) (pediatric): Secondary | ICD-10-CM | POA: Diagnosis not present

## 2021-01-15 DEATH — deceased

## 2021-01-16 ENCOUNTER — Ambulatory Visit: Payer: Medicaid Other | Admitting: Family

## 2021-01-16 ENCOUNTER — Telehealth: Payer: Self-pay

## 2021-01-16 NOTE — Telephone Encounter (Signed)
Copied from CRM [id number]. Topic: General - Other >> Jan 16, 2021  9:31 AM Daphine Deutscher D wrote: Reason for CRM: April with [facility] called asking Dr. Kirtland Bouchard to lease go in the system and sign the patients death Cert.   CB#  [phone]

## 2021-01-16 NOTE — Telephone Encounter (Signed)
Please let them know Dr. Kirtland Bouchard is out of the office and will address this upon his return on Monday

## 2021-01-19 LAB — BLOOD GAS, ARTERIAL
Acid-Base Excess: 14 mmol/L — ABNORMAL HIGH (ref 0.0–2.0)
Bicarbonate: 39 mmol/L — ABNORMAL HIGH (ref 20.0–28.0)
Delivery systems: POSITIVE
Expiratory PAP: 5
FIO2: 40
Inspiratory PAP: 15
O2 Saturation: 99.6 %
Patient temperature: 37
pCO2 arterial: 50 mmHg — ABNORMAL HIGH (ref 32.0–48.0)
pH, Arterial: 7.5 — ABNORMAL HIGH (ref 7.350–7.450)
pO2, Arterial: 160 mmHg — ABNORMAL HIGH (ref 83.0–108.0)

## 2021-01-19 NOTE — Telephone Encounter (Signed)
Copied from CRM [id number]. Topic: General - Other >> Jan 19, 2021  9:52 AM Gaetana Michaelis A wrote: Reason for CRM: April with [facility] has called regarding the completion of patient's death certificate   [place] DAVES # [phone]  Please contact further when possible

## 2021-01-19 NOTE — Telephone Encounter (Signed)
Called April at [facility]. Discussed case. I will complete [place] DAVE death cert now, based on best known clinical information with his recent COPD exacerbation hospitalization.  Saralyn Pilar, DO [facility] 01/19/2021, 12:48 PM

## 2022-03-22 IMAGING — DX DG CHEST 1V PORT
2 series · 2 of 2 positions shown · non-contrast
Comparison: September 07, 2020.

CLINICAL DATA: Chest pain, shortness of breath.

EXAM:
PORTABLE CHEST 1 VIEW

[chest ap (1 of 2)]
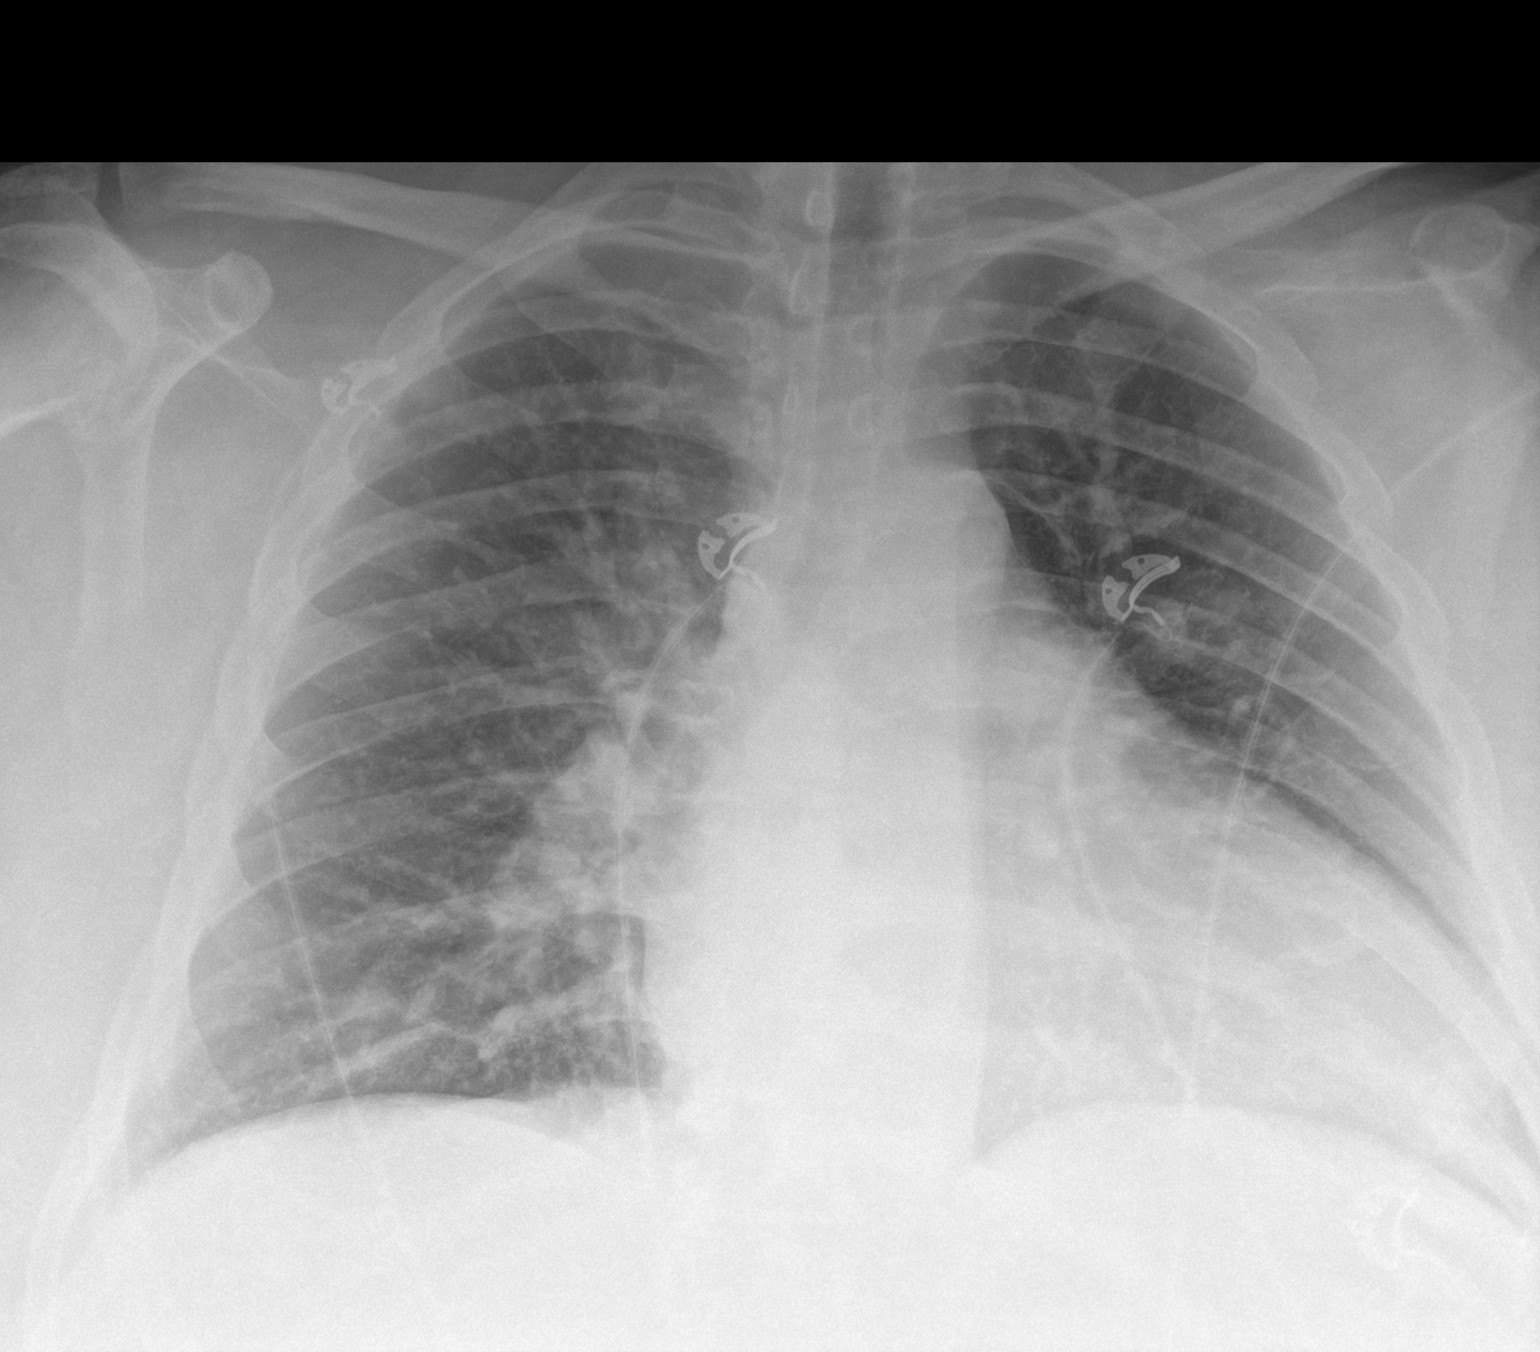

[chest ap (2 of 2)]
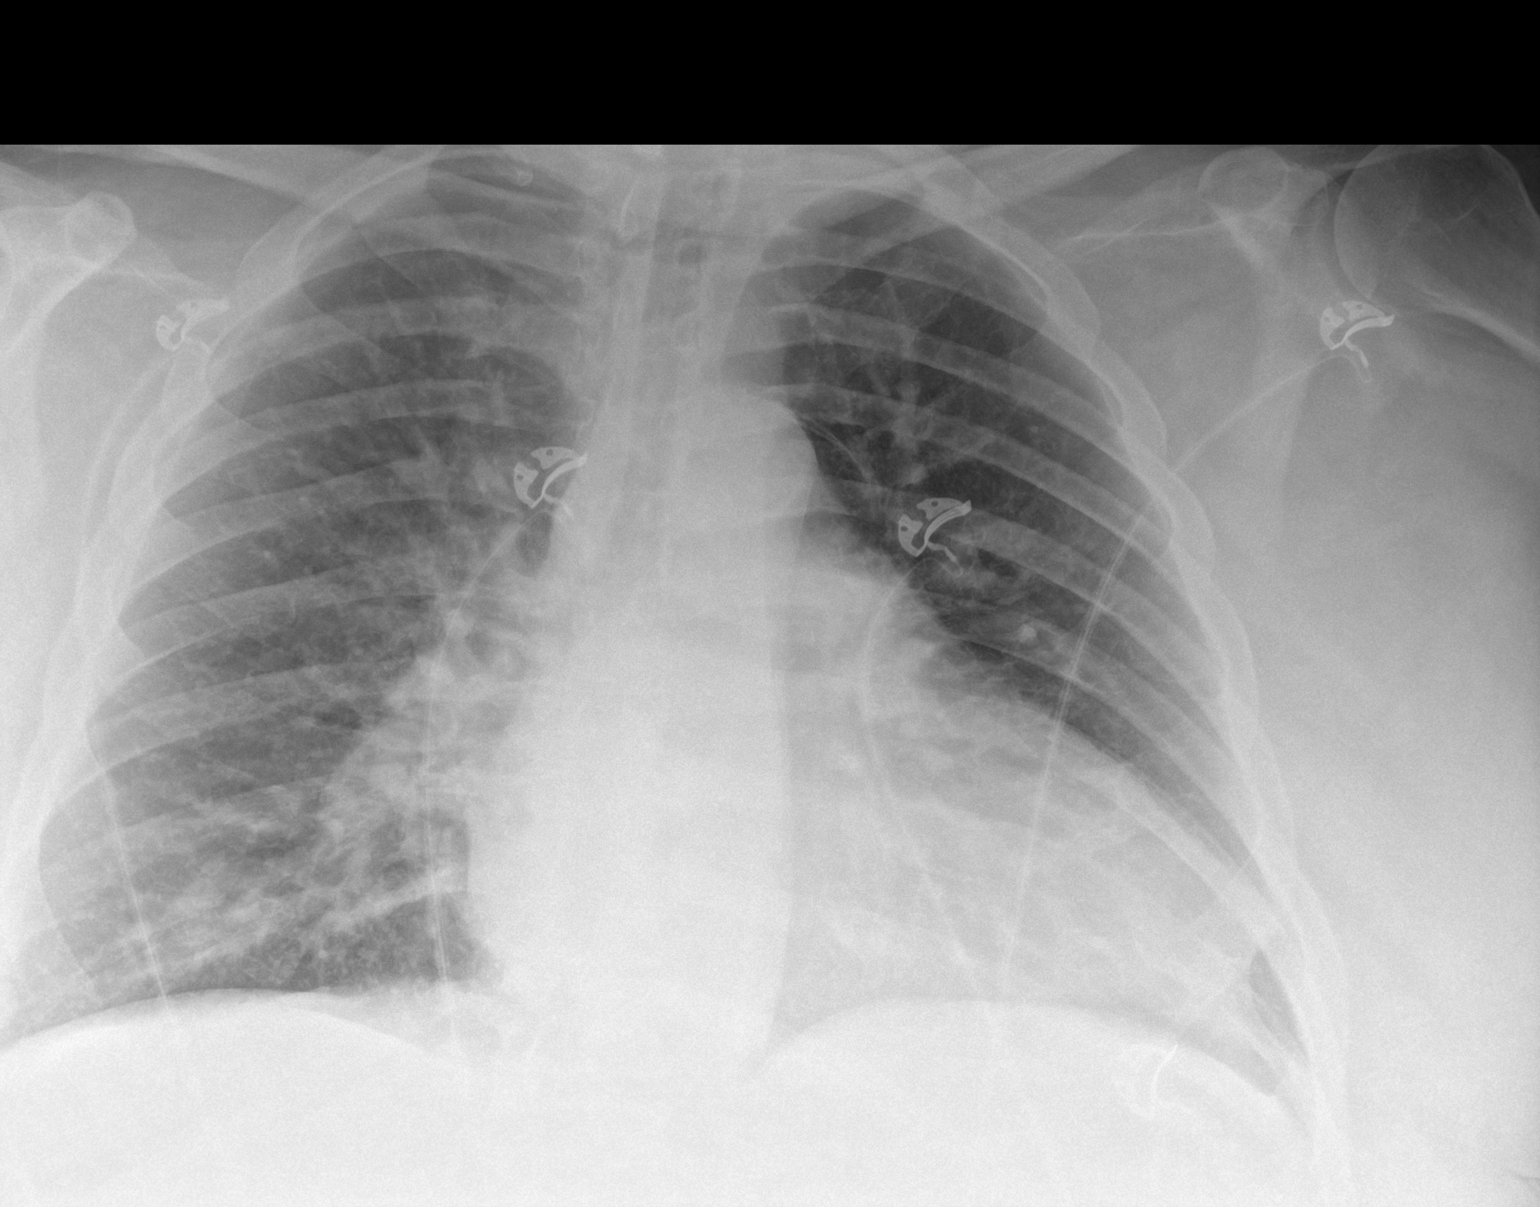

[2 of 2 positions shown; findings below may reference images not displayed]

FINDINGS: Stable cardiomegaly with central pulmonary vascular congestion. No
consolidative process is noted. The visualized skeletal structures
are unremarkable.
IMPRESSION: Stable cardiomegaly with central pulmonary vascular congestion.
